# Patient Record
Sex: Male | Born: 1945
Health system: Southern US, Community
[De-identification: ages and names within clinical notes are randomized; demographics above are authoritative.]

## PROBLEM LIST (undated history)

## (undated) DIAGNOSIS — R7989 Other specified abnormal findings of blood chemistry: Secondary | ICD-10-CM

## (undated) DIAGNOSIS — F411 Generalized anxiety disorder: Secondary | ICD-10-CM

## (undated) DIAGNOSIS — G4730 Sleep apnea, unspecified: Secondary | ICD-10-CM

## (undated) DIAGNOSIS — Z87898 Personal history of other specified conditions: Secondary | ICD-10-CM

## (undated) DIAGNOSIS — I499 Cardiac arrhythmia, unspecified: Secondary | ICD-10-CM

## (undated) DIAGNOSIS — M9080 Osteopathy in diseases classified elsewhere, unspecified site: Secondary | ICD-10-CM

## (undated) DIAGNOSIS — I517 Cardiomegaly: Secondary | ICD-10-CM

## (undated) DIAGNOSIS — D509 Iron deficiency anemia, unspecified: Secondary | ICD-10-CM

## (undated) DIAGNOSIS — E785 Hyperlipidemia, unspecified: Secondary | ICD-10-CM

## (undated) DIAGNOSIS — I5030 Unspecified diastolic (congestive) heart failure: Secondary | ICD-10-CM

## (undated) DIAGNOSIS — N179 Acute kidney failure, unspecified: Secondary | ICD-10-CM

## (undated) DIAGNOSIS — R0600 Dyspnea, unspecified: Secondary | ICD-10-CM

## (undated) DIAGNOSIS — M4850XA Collapsed vertebra, not elsewhere classified, site unspecified, initial encounter for fracture: Secondary | ICD-10-CM

## (undated) DIAGNOSIS — R2681 Unsteadiness on feet: Secondary | ICD-10-CM

## (undated) DIAGNOSIS — Z87442 Personal history of urinary calculi: Secondary | ICD-10-CM

## (undated) DIAGNOSIS — S8992XA Unspecified injury of left lower leg, initial encounter: Secondary | ICD-10-CM

## (undated) DIAGNOSIS — M1990 Unspecified osteoarthritis, unspecified site: Secondary | ICD-10-CM

## (undated) DIAGNOSIS — E039 Hypothyroidism, unspecified: Secondary | ICD-10-CM

## (undated) DIAGNOSIS — I4891 Unspecified atrial fibrillation: Secondary | ICD-10-CM

## (undated) DIAGNOSIS — N200 Calculus of kidney: Secondary | ICD-10-CM

## (undated) DIAGNOSIS — Z9181 History of falling: Secondary | ICD-10-CM

## (undated) DIAGNOSIS — K219 Gastro-esophageal reflux disease without esophagitis: Secondary | ICD-10-CM

## (undated) DIAGNOSIS — M545 Low back pain, unspecified: Secondary | ICD-10-CM

## (undated) DIAGNOSIS — E890 Postprocedural hypothyroidism: Secondary | ICD-10-CM

## (undated) DIAGNOSIS — N400 Enlarged prostate without lower urinary tract symptoms: Secondary | ICD-10-CM

## (undated) DIAGNOSIS — Z8639 Personal history of other endocrine, nutritional and metabolic disease: Secondary | ICD-10-CM

## (undated) DIAGNOSIS — N390 Urinary tract infection, site not specified: Secondary | ICD-10-CM

## (undated) DIAGNOSIS — D696 Thrombocytopenia, unspecified: Secondary | ICD-10-CM

## (undated) DIAGNOSIS — R296 Repeated falls: Secondary | ICD-10-CM

## (undated) DIAGNOSIS — N401 Enlarged prostate with lower urinary tract symptoms: Secondary | ICD-10-CM

## (undated) DIAGNOSIS — Z974 Presence of external hearing-aid: Secondary | ICD-10-CM

## (undated) DIAGNOSIS — I4819 Other persistent atrial fibrillation: Secondary | ICD-10-CM

## (undated) DIAGNOSIS — F329 Major depressive disorder, single episode, unspecified: Secondary | ICD-10-CM

## (undated) DIAGNOSIS — R2689 Other abnormalities of gait and mobility: Secondary | ICD-10-CM

## (undated) DIAGNOSIS — L400 Psoriasis vulgaris: Secondary | ICD-10-CM

## (undated) DIAGNOSIS — F419 Anxiety disorder, unspecified: Secondary | ICD-10-CM

## (undated) DIAGNOSIS — T7840XA Allergy, unspecified, initial encounter: Secondary | ICD-10-CM

## (undated) DIAGNOSIS — S62109A Fracture of unspecified carpal bone, unspecified wrist, initial encounter for closed fracture: Secondary | ICD-10-CM

## (undated) DIAGNOSIS — Z8619 Personal history of other infectious and parasitic diseases: Secondary | ICD-10-CM

## (undated) DIAGNOSIS — E079 Disorder of thyroid, unspecified: Secondary | ICD-10-CM

## (undated) DIAGNOSIS — F341 Dysthymic disorder: Secondary | ICD-10-CM

## (undated) DIAGNOSIS — Z8781 Personal history of (healed) traumatic fracture: Secondary | ICD-10-CM

## (undated) DIAGNOSIS — Z9289 Personal history of other medical treatment: Secondary | ICD-10-CM

## (undated) DIAGNOSIS — E889 Metabolic disorder, unspecified: Secondary | ICD-10-CM

## (undated) DIAGNOSIS — C801 Malignant (primary) neoplasm, unspecified: Secondary | ICD-10-CM

## (undated) DIAGNOSIS — I2510 Atherosclerotic heart disease of native coronary artery without angina pectoris: Secondary | ICD-10-CM

## (undated) DIAGNOSIS — T84020S Dislocation of internal right hip prosthesis, sequela: Secondary | ICD-10-CM

## (undated) DIAGNOSIS — E7800 Pure hypercholesterolemia, unspecified: Secondary | ICD-10-CM

## (undated) DIAGNOSIS — Z9109 Other allergy status, other than to drugs and biological substances: Secondary | ICD-10-CM

## (undated) DIAGNOSIS — Z5189 Encounter for other specified aftercare: Secondary | ICD-10-CM

## (undated) DIAGNOSIS — E291 Testicular hypofunction: Secondary | ICD-10-CM

## (undated) DIAGNOSIS — G4733 Obstructive sleep apnea (adult) (pediatric): Secondary | ICD-10-CM

## (undated) DIAGNOSIS — M810 Age-related osteoporosis without current pathological fracture: Secondary | ICD-10-CM

## (undated) DIAGNOSIS — N189 Chronic kidney disease, unspecified: Secondary | ICD-10-CM

## (undated) DIAGNOSIS — I10 Essential (primary) hypertension: Secondary | ICD-10-CM

## (undated) DIAGNOSIS — Z7901 Long term (current) use of anticoagulants: Secondary | ICD-10-CM

## (undated) DIAGNOSIS — C8330 Diffuse large B-cell lymphoma, unspecified site: Secondary | ICD-10-CM

## (undated) HISTORY — PX: OTHER SURGICAL HISTORY: SHX169

## (undated) HISTORY — PX: CATARACT EXTRACTION, BILATERAL: SHX1313

## (undated) HISTORY — PX: CATARACT EXTRACTION W/ INTRAOCULAR LENS  IMPLANT, BILATERAL: SHX1307

## (undated) HISTORY — PX: COLONOSCOPY: SHX174

## (undated) HISTORY — DX: Unsteadiness on feet: R26.81

## (undated) HISTORY — PX: FRACTURE SURGERY: SHX138

## (undated) HISTORY — DX: Hyperlipidemia, unspecified: E78.5

## (undated) HISTORY — PX: JOINT REPLACEMENT: SHX530

## (undated) HISTORY — PX: EYE SURGERY: SHX253

## (undated) HISTORY — DX: Encounter for other specified aftercare: Z51.89

## (undated) HISTORY — DX: Chronic kidney disease, unspecified: N18.9

## (undated) HISTORY — DX: Dislocation of internal right hip prosthesis, sequela: T84.020S

## (undated) HISTORY — PX: APPENDECTOMY: SHX54

## (undated) HISTORY — DX: Allergy, unspecified, initial encounter: T78.40XA

## (undated) HISTORY — PX: TOTAL HIP ARTHROPLASTY: SHX124

## (undated) HISTORY — DX: Benign prostatic hyperplasia without lower urinary tract symptoms: N40.0

## (undated) HISTORY — PX: CARDIAC CATHETERIZATION: SHX172

## (undated) HISTORY — PX: TOTAL KNEE ARTHROPLASTY: SHX125

## (undated) HISTORY — DX: Thrombocytopenia, unspecified: D69.6

---

## 1986-02-08 HISTORY — PX: THYROIDECTOMY: SHX17

## 1998-04-10 ENCOUNTER — Ambulatory Visit (HOSPITAL_COMMUNITY): Admission: RE | Admit: 1998-04-10 | Discharge: 1998-04-10 | Payer: Self-pay | Admitting: Dermatology

## 2004-09-16 ENCOUNTER — Ambulatory Visit: Payer: Self-pay | Admitting: Family Medicine

## 2004-09-29 ENCOUNTER — Ambulatory Visit: Payer: Self-pay | Admitting: Family Medicine

## 2004-10-01 ENCOUNTER — Ambulatory Visit: Payer: Self-pay | Admitting: Family Medicine

## 2004-10-06 ENCOUNTER — Ambulatory Visit: Payer: Self-pay | Admitting: Family Medicine

## 2004-10-08 ENCOUNTER — Ambulatory Visit: Payer: Self-pay | Admitting: Family Medicine

## 2004-10-13 ENCOUNTER — Ambulatory Visit: Payer: Self-pay | Admitting: Family Medicine

## 2004-10-15 ENCOUNTER — Ambulatory Visit: Payer: Self-pay | Admitting: Family Medicine

## 2004-10-20 ENCOUNTER — Ambulatory Visit: Payer: Self-pay | Admitting: Family Medicine

## 2004-10-22 ENCOUNTER — Ambulatory Visit: Payer: Self-pay | Admitting: Family Medicine

## 2004-10-27 ENCOUNTER — Ambulatory Visit: Payer: Self-pay | Admitting: Family Medicine

## 2004-10-29 ENCOUNTER — Ambulatory Visit: Payer: Self-pay | Admitting: Family Medicine

## 2004-11-03 ENCOUNTER — Ambulatory Visit: Payer: Self-pay | Admitting: Family Medicine

## 2004-11-05 ENCOUNTER — Ambulatory Visit: Payer: Self-pay | Admitting: Family Medicine

## 2004-11-10 ENCOUNTER — Ambulatory Visit: Payer: Self-pay | Admitting: Family Medicine

## 2004-12-09 ENCOUNTER — Ambulatory Visit: Payer: Self-pay | Admitting: Family Medicine

## 2005-01-12 ENCOUNTER — Ambulatory Visit: Payer: Self-pay | Admitting: Family Medicine

## 2005-02-11 ENCOUNTER — Ambulatory Visit: Payer: Self-pay | Admitting: Family Medicine

## 2005-03-17 ENCOUNTER — Ambulatory Visit: Payer: Self-pay | Admitting: Family Medicine

## 2005-04-12 ENCOUNTER — Ambulatory Visit: Payer: Self-pay | Admitting: Family Medicine

## 2005-05-12 ENCOUNTER — Ambulatory Visit: Payer: Self-pay | Admitting: Family Medicine

## 2005-06-17 ENCOUNTER — Ambulatory Visit: Payer: Self-pay | Admitting: Family Medicine

## 2005-07-14 ENCOUNTER — Ambulatory Visit: Payer: Self-pay | Admitting: Family Medicine

## 2005-07-21 ENCOUNTER — Ambulatory Visit: Payer: Self-pay | Admitting: Family Medicine

## 2005-07-29 ENCOUNTER — Ambulatory Visit: Payer: Self-pay | Admitting: Family Medicine

## 2005-08-03 ENCOUNTER — Ambulatory Visit: Payer: Self-pay | Admitting: Family Medicine

## 2005-09-01 ENCOUNTER — Ambulatory Visit: Payer: Self-pay | Admitting: Family Medicine

## 2005-09-16 ENCOUNTER — Ambulatory Visit: Payer: Self-pay | Admitting: Family Medicine

## 2005-10-15 ENCOUNTER — Ambulatory Visit: Payer: Self-pay | Admitting: Family Medicine

## 2005-10-21 ENCOUNTER — Ambulatory Visit: Payer: Self-pay | Admitting: Family Medicine

## 2005-11-22 ENCOUNTER — Ambulatory Visit: Payer: Self-pay | Admitting: Family Medicine

## 2005-12-21 ENCOUNTER — Ambulatory Visit: Payer: Self-pay | Admitting: Family Medicine

## 2010-10-11 HISTORY — PX: KNEE SURGERY: SHX244

## 2010-11-26 ENCOUNTER — Other Ambulatory Visit (HOSPITAL_COMMUNITY): Payer: BC Managed Care – PPO

## 2010-11-26 ENCOUNTER — Other Ambulatory Visit (HOSPITAL_COMMUNITY): Payer: Self-pay

## 2011-02-16 ENCOUNTER — Other Ambulatory Visit (HOSPITAL_COMMUNITY): Payer: BC Managed Care – PPO

## 2011-03-04 ENCOUNTER — Ambulatory Visit (HOSPITAL_COMMUNITY)
Admission: RE | Admit: 2011-03-04 | Discharge: 2011-03-04 | Disposition: A | Discharge status: Home / Self Care | Payer: Medicare Other | Source: Ambulatory Visit | Attending: Orthopedic Surgery | Admitting: Orthopedic Surgery

## 2011-03-04 ENCOUNTER — Encounter (HOSPITAL_COMMUNITY)
Admission: RE | Admit: 2011-03-04 | Discharge: 2011-03-04 | Disposition: A | Discharge status: Home / Self Care | Payer: Medicare Other | Source: Ambulatory Visit | Attending: Orthopedic Surgery | Admitting: Orthopedic Surgery

## 2011-03-04 ENCOUNTER — Other Ambulatory Visit (HOSPITAL_COMMUNITY): Payer: Self-pay | Admitting: Orthopedic Surgery

## 2011-03-04 DIAGNOSIS — Z01811 Encounter for preprocedural respiratory examination: Secondary | ICD-10-CM | POA: Insufficient documentation

## 2011-03-04 DIAGNOSIS — Z01812 Encounter for preprocedural laboratory examination: Secondary | ICD-10-CM | POA: Insufficient documentation

## 2011-03-04 DIAGNOSIS — F17200 Nicotine dependence, unspecified, uncomplicated: Secondary | ICD-10-CM | POA: Insufficient documentation

## 2011-03-04 LAB — URINALYSIS, ROUTINE W REFLEX MICROSCOPIC
Bilirubin Urine: NEGATIVE
Glucose, UA: NEGATIVE mg/dL
Ketones, ur: NEGATIVE mg/dL
Nitrite: POSITIVE — AB
Protein, ur: NEGATIVE mg/dL

## 2011-03-04 LAB — DIFFERENTIAL
Basophils Absolute: 0 10*3/uL (ref 0.0–0.1)
Basophils Relative: 1 % (ref 0–1)
Eosinophils Absolute: 0.3 10*3/uL (ref 0.0–0.7)
Lymphs Abs: 3.3 10*3/uL (ref 0.7–4.0)
Neutrophils Relative %: 50 % (ref 43–77)

## 2011-03-04 LAB — PROTIME-INR
INR: 0.88 (ref 0.00–1.49)
Prothrombin Time: 12.1 seconds (ref 11.6–15.2)

## 2011-03-04 LAB — URINE MICROSCOPIC-ADD ON

## 2011-03-04 LAB — CBC
Platelets: 199 10*3/uL (ref 150–400)
RBC: 4.95 MIL/uL (ref 4.22–5.81)
WBC: 8.1 10*3/uL (ref 4.0–10.5)

## 2011-03-04 LAB — COMPREHENSIVE METABOLIC PANEL
AST: 19 U/L (ref 0–37)
Albumin: 3.6 g/dL (ref 3.5–5.2)
BUN: 20 mg/dL (ref 6–23)
Calcium: 9.5 mg/dL (ref 8.4–10.5)
Creatinine, Ser: 0.93 mg/dL (ref 0.4–1.5)
GFR calc Af Amer: >60 mL/min (ref >60)

## 2011-03-04 LAB — SURGICAL PCR SCREEN: Staphylococcus aureus: NEGATIVE

## 2011-03-04 LAB — ABO/RH: ABO/RH(D): A POS

## 2011-03-04 LAB — APTT: aPTT: 34 seconds (ref 24–37)

## 2011-03-06 LAB — URINE CULTURE: Culture  Setup Time: 201205241636

## 2011-03-12 ENCOUNTER — Ambulatory Visit (HOSPITAL_COMMUNITY)
Admission: RE | Admit: 2011-03-12 | Discharge: 2011-03-12 | Disposition: A | Discharge status: Home / Self Care | Payer: Medicare Other | Source: Ambulatory Visit | Attending: Orthopedic Surgery | Admitting: Orthopedic Surgery

## 2011-03-12 DIAGNOSIS — Z01812 Encounter for preprocedural laboratory examination: Secondary | ICD-10-CM | POA: Insufficient documentation

## 2011-03-12 LAB — URINALYSIS, ROUTINE W REFLEX MICROSCOPIC
Bilirubin Urine: NEGATIVE
Glucose, UA: NEGATIVE mg/dL
Hgb urine dipstick: NEGATIVE
Ketones, ur: NEGATIVE mg/dL
Nitrite: NEGATIVE
Specific Gravity, Urine: 1.015 (ref 1.005–1.030)
pH: 6.5 (ref 5.0–8.0)

## 2011-03-12 LAB — HEMOGLOBIN A1C: Mean Plasma Glucose: 128 mg/dL — ABNORMAL HIGH (ref <117)

## 2011-03-13 LAB — URINE CULTURE
Colony Count: NO GROWTH
Culture  Setup Time: 201206011040
Culture: NO GROWTH

## 2011-03-15 ENCOUNTER — Inpatient Hospital Stay (HOSPITAL_COMMUNITY)
Admission: RE | Admit: 2011-03-15 | Discharge: 2011-03-17 | DRG: 470 | Disposition: A | Discharge status: Home Health | Payer: Medicare Other | Source: Ambulatory Visit | Attending: Orthopedic Surgery | Admitting: Orthopedic Surgery

## 2011-03-15 DIAGNOSIS — J449 Chronic obstructive pulmonary disease, unspecified: Secondary | ICD-10-CM | POA: Diagnosis present

## 2011-03-15 DIAGNOSIS — M1710 Unilateral primary osteoarthritis, unspecified knee: Principal | ICD-10-CM | POA: Diagnosis present

## 2011-03-15 DIAGNOSIS — F17200 Nicotine dependence, unspecified, uncomplicated: Secondary | ICD-10-CM | POA: Diagnosis present

## 2011-03-15 DIAGNOSIS — A498 Other bacterial infections of unspecified site: Secondary | ICD-10-CM | POA: Diagnosis present

## 2011-03-15 DIAGNOSIS — J4489 Other specified chronic obstructive pulmonary disease: Secondary | ICD-10-CM | POA: Diagnosis present

## 2011-03-15 DIAGNOSIS — D62 Acute posthemorrhagic anemia: Secondary | ICD-10-CM | POA: Diagnosis not present

## 2011-03-15 DIAGNOSIS — I10 Essential (primary) hypertension: Secondary | ICD-10-CM | POA: Diagnosis present

## 2011-03-15 DIAGNOSIS — N390 Urinary tract infection, site not specified: Secondary | ICD-10-CM | POA: Diagnosis present

## 2011-03-15 DIAGNOSIS — E871 Hypo-osmolality and hyponatremia: Secondary | ICD-10-CM | POA: Diagnosis present

## 2011-03-15 LAB — HEMOGLOBIN A1C
Hgb A1c MFr Bld: 6.4 % — ABNORMAL HIGH (ref <5.7)
Mean Plasma Glucose: 137 mg/dL — ABNORMAL HIGH (ref <117)

## 2011-03-15 LAB — GLUCOSE, CAPILLARY: Glucose-Capillary: 143 mg/dL — ABNORMAL HIGH (ref 70–99)

## 2011-03-15 LAB — TYPE AND SCREEN
ABO/RH(D): A POS
Antibody Screen: NEGATIVE

## 2011-03-16 LAB — BASIC METABOLIC PANEL
CO2: 27 mEq/L (ref 19–32)
Chloride: 94 mEq/L — ABNORMAL LOW (ref 96–112)
Creatinine, Ser: 0.78 mg/dL (ref 0.4–1.5)
GFR calc Af Amer: >60 mL/min (ref >60)
Sodium: 128 mEq/L — ABNORMAL LOW (ref 135–145)

## 2011-03-16 LAB — GLUCOSE, CAPILLARY: Glucose-Capillary: 141 mg/dL — ABNORMAL HIGH (ref 70–99)

## 2011-03-16 LAB — CBC
Hemoglobin: 12.1 g/dL — ABNORMAL LOW (ref 13.0–17.0)
MCH: 30.3 pg (ref 26.0–34.0)
Platelets: 149 10*3/uL — ABNORMAL LOW (ref 150–400)
RBC: 3.99 MIL/uL — ABNORMAL LOW (ref 4.22–5.81)
WBC: 10.5 10*3/uL (ref 4.0–10.5)

## 2011-03-16 NOTE — Op Note (Signed)
[name], [name]              ACCOUNT NO.:  [id number]  MEDICAL RECORD NO.:  [id number]  LOCATION:  5037                         FACILITY:  [facility]  PHYSICIAN:  Elana Alm. Thurston Lane, M.D. DATE OF BIRTH:  May 20, 1946  DATE OF PROCEDURE:  03/15/2011 DATE OF DISCHARGE:                              OPERATIVE REPORT   PREOPERATIVE DIAGNOSIS:  Right knee degenerative joint disease.  POSTOPERATIVE DIAGNOSIS:  Right knee degenerative joint disease.  PROCEDURE: 1. Right total knee replacement using DePuy cemented total knee system     with #5 cemented femur, #6 cemented tibia with 12.5-mm polyethylene     RP tibial spacer and 38 mm polyethylene cemented patella. 2. Tobramycin impregnated cement.  SURGEON:  Elana Alm. Thurston Hole, MD  ASSISTANT:  Julien Girt, PA-C  ANESTHESIA:  General.  OPERATIVE TIME:  1 hour and 20 minutes.  COMPLICATIONS:  None.  DESCRIPTION OF PROCEDURE:  [name] was brought to the operating room on March 15, 2011, after a femoral nerve block was placed in the holding room by Anesthesia.  He was placed on the operative table in supine position.  He received both Ancef 2 g and gentamicin 700 mg per Dr. Imelda Pillow recommendations for a previous UTI for preoperative prophylaxis.  After being placed under general anesthesia, he had a Foley catheter placed under sterile conditions.  His right knee was examined.  Range of motion from 0-125 degrees, mild valgus deformity, knee stable, ligamentous exam with normal patellar tracking.  The right leg was prepped using sterile DuraPrep and draped using sterile technique.  Leg was exsanguinated and a thigh tourniquet elevated at 375 mm.  Time-out procedure was called and the correct right knee identified.  Initially, through a 15-cm longitudinal incision based over the patella initial exposure was made.  The underlying subcutaneous tissues were incised along with skin incision.  A median arthrotomy was performed  revealing an excessive amount of normal-appearing joint fluid. The articular surfaces were inspected.  He had grade 3 and 4 changes medially, grade 4 changes laterally and grade 3 and 4 changes in the patellofemoral joint.  Osteophytes removed from the femoral condyles and tibial plateau.  The medial and lateral meniscal remnants were removed as well as the anterior cruciate ligament.  Intramedullary drill was drilled up the femoral canal for placement of distal femoral cutting jig which was placed in the appropriate manner of rotation and a distal 10- mm cut was made.  The distal femur was incised.  #5 was found to be the appropriate size.  #5 cutting jig was placed in appropriate manner of external rotation and then these cuts were made.  Proximal tibia was then exposed.  The tibial spines were removed with an oscillating saw. Intramedullary drill was drilled down the tibial canal for placement of proximal tibial cutting jig which was placed in the appropriate manner of rotation and a proximal 6-mm cut was made based off the medial or higher side resecting 1-2 mm off the lateral or lower side.  Spacer blocks were then placed in flexion and extension.  12.5-mm blocks gave excellent balancing, excellent stability, and excellent correction of his flexion and valgus deformities.  #6 tibial baseplate trial was  placed on the cut tibial surface with an excellent fit and a keel cut was made.  The PCL box cutter was then placed on the distal femur and then these cuts were made.  At this point, the #5 femoral trial was placed and with a #6 tibial baseplate trial the 12.5-mm polyethylene RP tibial spacer, knee was reduced, taken through range of motion from 0- 125 degrees with excellent stability and excellent correction of his flexion and valgus deformities and normal patellar tracking.  A resurfacing 10-mm cut was made along the patella and three locking holes placed for a 38-mm polyethylene  patellar trial and again patellofemoral tracking was evaluated and found to be normal.  At this point, it was felt that all the trial components were of excellent size, fit, and stability.  They were then removed.  The knee was then jet lavage irrigated with 3 liters of saline.  Proximal tibia was then exposed and the #6 tibial baseplate with tobramycin impregnated cement backing was hammered into position with an excellent fit with excess cement being removed from around the edges.  #5 femoral component with cement backing was hammered into position also with an excellent fit with excess cement being removed from around the edges.  The 12.5-mm polyethylene RP tibial spacer was placed on tibial baseplate.  The knee reduced, taken through range of motion from 0-125 degrees with excellent stability and excellent correction of his flexion and valgus deformities.  The 38-mm polyethylene cement backed patella was then placed in its position and held there with a clamp.  After the cement hardened again patellofemoral tracking was evaluated and found to be normal.  At this point, it was felt that all the components were of excellent size, fit and stability. The wound was further irrigated with saline.  Tourniquet was released. Hemostasis obtained with cautery.  The arthrotomy was then closed with #1 Ethibond suture over two medium Hemovac drains.  Subcutaneous tissues were closed with 0 and 2-0 Vicryl, subcuticular layer closed with 4-0 Monocryl.  Sterile dressings and long-leg splint applied.  The patient then awakened, extubated, taken to recovery in stable condition. Needle, sponge count was correct x2 at the end of the case. Neurovascular status normal.  Pulses 2+ and symmetric.     [name], M.D.     RAW/MEDQ  D:  03/15/2011  T:  03/15/2011  Job:  [id number]  Electronically Signed by Salvatore Marvel M.D. on 03/16/2011 01:32:34 PM

## 2011-03-17 LAB — BASIC METABOLIC PANEL
BUN: 14 mg/dL (ref 6–23)
Calcium: 8.7 mg/dL (ref 8.4–10.5)
Creatinine, Ser: 0.91 mg/dL (ref 0.4–1.5)
GFR calc non Af Amer: >60 mL/min (ref >60)
Glucose, Bld: 177 mg/dL — ABNORMAL HIGH (ref 70–99)

## 2011-03-17 LAB — GLUCOSE, CAPILLARY: Glucose-Capillary: 152 mg/dL — ABNORMAL HIGH (ref 70–99)

## 2011-03-17 LAB — CBC
HCT: 33.6 % — ABNORMAL LOW (ref 39.0–52.0)
MCHC: 33.6 g/dL (ref 30.0–36.0)
MCV: 89.1 fL (ref 78.0–100.0)
Platelets: 163 10*3/uL (ref 150–400)
RDW: 14.5 % (ref 11.5–15.5)

## 2011-06-23 ENCOUNTER — Encounter (HOSPITAL_COMMUNITY)
Admission: RE | Admit: 2011-06-23 | Discharge: 2011-06-23 | Disposition: A | Discharge status: Home / Self Care | Payer: Medicare Other | Source: Ambulatory Visit | Attending: Orthopedic Surgery | Admitting: Orthopedic Surgery

## 2011-06-23 ENCOUNTER — Other Ambulatory Visit (HOSPITAL_COMMUNITY): Payer: Self-pay | Admitting: Orthopedic Surgery

## 2011-06-23 ENCOUNTER — Ambulatory Visit (HOSPITAL_COMMUNITY)
Admission: RE | Admit: 2011-06-23 | Discharge: 2011-06-23 | Disposition: A | Discharge status: Home / Self Care | Payer: Medicare Other | Source: Ambulatory Visit | Attending: Orthopedic Surgery | Admitting: Orthopedic Surgery

## 2011-06-23 DIAGNOSIS — M1712 Unilateral primary osteoarthritis, left knee: Secondary | ICD-10-CM

## 2011-06-23 DIAGNOSIS — IMO0002 Reserved for concepts with insufficient information to code with codable children: Secondary | ICD-10-CM | POA: Insufficient documentation

## 2011-06-23 DIAGNOSIS — Z79899 Other long term (current) drug therapy: Secondary | ICD-10-CM | POA: Insufficient documentation

## 2011-06-23 DIAGNOSIS — Z01818 Encounter for other preprocedural examination: Secondary | ICD-10-CM | POA: Insufficient documentation

## 2011-06-23 DIAGNOSIS — M1710 Unilateral primary osteoarthritis, unspecified knee: Secondary | ICD-10-CM | POA: Insufficient documentation

## 2011-06-23 DIAGNOSIS — Z01812 Encounter for preprocedural laboratory examination: Secondary | ICD-10-CM | POA: Insufficient documentation

## 2011-06-23 LAB — CBC
HCT: 44.1 % (ref 39.0–52.0)
MCHC: 34 g/dL (ref 30.0–36.0)
Platelets: 227 10*3/uL (ref 150–400)
RDW: 14.3 % (ref 11.5–15.5)

## 2011-06-23 LAB — DIFFERENTIAL
Basophils Absolute: 0 10*3/uL (ref 0.0–0.1)
Basophils Relative: 0 % (ref 0–1)
Eosinophils Absolute: 0.1 10*3/uL (ref 0.0–0.7)
Eosinophils Relative: 1 % (ref 0–5)
Lymphocytes Relative: 19 % (ref 12–46)
Monocytes Absolute: 0.9 10*3/uL (ref 0.1–1.0)

## 2011-06-23 LAB — URINALYSIS, ROUTINE W REFLEX MICROSCOPIC
Hgb urine dipstick: NEGATIVE
Protein, ur: NEGATIVE mg/dL
Urobilinogen, UA: 0.2 mg/dL (ref 0.0–1.0)

## 2011-06-23 LAB — SURGICAL PCR SCREEN: MRSA, PCR: NEGATIVE

## 2011-06-23 LAB — COMPREHENSIVE METABOLIC PANEL
ALT: 25 U/L (ref 0–53)
BUN: 19 mg/dL (ref 6–23)
Calcium: 9.8 mg/dL (ref 8.4–10.5)
GFR calc Af Amer: >60 mL/min (ref >60)
Glucose, Bld: 99 mg/dL (ref 70–99)
Sodium: 137 mEq/L (ref 135–145)
Total Protein: 7.4 g/dL (ref 6.0–8.3)

## 2011-06-23 LAB — APTT: aPTT: 31 seconds (ref 24–37)

## 2011-06-23 LAB — PROTIME-INR: Prothrombin Time: 12.6 seconds (ref 11.6–15.2)

## 2011-06-24 LAB — URINE CULTURE

## 2011-06-30 ENCOUNTER — Inpatient Hospital Stay (HOSPITAL_COMMUNITY)
Admission: RE | Admit: 2011-06-30 | Discharge: 2011-07-02 | DRG: 470 | Disposition: A | Discharge status: Home Health | Payer: Medicare Other | Source: Ambulatory Visit | Attending: Orthopedic Surgery | Admitting: Orthopedic Surgery

## 2011-06-30 DIAGNOSIS — K219 Gastro-esophageal reflux disease without esophagitis: Secondary | ICD-10-CM | POA: Diagnosis present

## 2011-06-30 DIAGNOSIS — Z96659 Presence of unspecified artificial knee joint: Secondary | ICD-10-CM

## 2011-06-30 DIAGNOSIS — M1710 Unilateral primary osteoarthritis, unspecified knee: Principal | ICD-10-CM | POA: Diagnosis present

## 2011-06-30 DIAGNOSIS — F411 Generalized anxiety disorder: Secondary | ICD-10-CM | POA: Diagnosis present

## 2011-06-30 DIAGNOSIS — Z79899 Other long term (current) drug therapy: Secondary | ICD-10-CM

## 2011-06-30 DIAGNOSIS — F17200 Nicotine dependence, unspecified, uncomplicated: Secondary | ICD-10-CM | POA: Diagnosis present

## 2011-06-30 DIAGNOSIS — I10 Essential (primary) hypertension: Secondary | ICD-10-CM | POA: Diagnosis present

## 2011-06-30 DIAGNOSIS — D62 Acute posthemorrhagic anemia: Secondary | ICD-10-CM | POA: Diagnosis not present

## 2011-06-30 DIAGNOSIS — E119 Type 2 diabetes mellitus without complications: Secondary | ICD-10-CM | POA: Diagnosis present

## 2011-06-30 DIAGNOSIS — Z01812 Encounter for preprocedural laboratory examination: Secondary | ICD-10-CM

## 2011-06-30 LAB — TYPE AND SCREEN
ABO/RH(D): A POS
Antibody Screen: NEGATIVE

## 2011-06-30 LAB — GLUCOSE, CAPILLARY: Glucose-Capillary: 134 mg/dL — ABNORMAL HIGH (ref 70–99)

## 2011-07-01 LAB — CBC
HCT: 36.4 % — ABNORMAL LOW (ref 39.0–52.0)
MCV: 86.7 fL (ref 78.0–100.0)
RBC: 4.2 MIL/uL — ABNORMAL LOW (ref 4.22–5.81)
WBC: 9 10*3/uL (ref 4.0–10.5)

## 2011-07-01 LAB — BASIC METABOLIC PANEL
BUN: 13 mg/dL (ref 6–23)
CO2: 26 mEq/L (ref 19–32)
Chloride: 95 mEq/L — ABNORMAL LOW (ref 96–112)
Creatinine, Ser: 0.74 mg/dL (ref 0.50–1.35)

## 2011-07-01 LAB — GLUCOSE, CAPILLARY
Glucose-Capillary: 125 mg/dL — ABNORMAL HIGH (ref 70–99)
Glucose-Capillary: 129 mg/dL — ABNORMAL HIGH (ref 70–99)

## 2011-07-02 LAB — GLUCOSE, CAPILLARY: Glucose-Capillary: 125 mg/dL — ABNORMAL HIGH (ref 70–99)

## 2011-07-02 LAB — BASIC METABOLIC PANEL
BUN: 13 mg/dL (ref 6–23)
Creatinine, Ser: 0.71 mg/dL (ref 0.50–1.35)
GFR calc Af Amer: >60 mL/min (ref >60)
GFR calc non Af Amer: >60 mL/min (ref >60)
Glucose, Bld: 125 mg/dL — ABNORMAL HIGH (ref 70–99)

## 2011-07-02 LAB — CBC
HCT: 34.2 % — ABNORMAL LOW (ref 39.0–52.0)
Hemoglobin: 11.7 g/dL — ABNORMAL LOW (ref 13.0–17.0)
MCH: 29.3 pg (ref 26.0–34.0)
MCHC: 34.2 g/dL (ref 30.0–36.0)
MCV: 85.5 fL (ref 78.0–100.0)
RDW: 14.4 % (ref 11.5–15.5)

## 2011-07-06 NOTE — Op Note (Signed)
[name], Lane              ACCOUNT NO.:  [id number]  MEDICAL RECORD NO.:  [id number]  LOCATION:  5025                         FACILITY:  [facility]  PHYSICIAN:  Molly Maduro A. Thurston Lane, M.D. DATE OF BIRTH:  [birth year]  DATE OF PROCEDURE:  06/30/2011 DATE OF DISCHARGE:                              OPERATIVE REPORT   PREOPERATIVE DIAGNOSIS:  Left knee degenerative disc disease.  POSTOPERATIVE DIAGNOSIS:  Left knee degenerative disc disease.  PROCEDURE: 1. Left total knee replacement using DePuy cemented total knee system     with #5 cemented femur, #6 cemented tibia with 17.5 mm polyethylene     RP tibial spacer and 38 mm polyethylene cemented patella. 2. Tobramycin impregnated cement.  SURGEON:  Elana Alm. Thurston Lane, M.D.  ASSISTANT:  Julien Girt, PA-C  ANESTHESIA:  General.  OPERATIVE TIME:  1 hour and 30 minutes.  COMPLICATIONS:  None.  DESCRIPTION OF PROCEDURE:  [name] was brought into operating room on June 30, 2011, after femoral nerve block was placed by Anesthesia.  He was placed on the operative table in supine position. After being placed under general anesthesia, he received vancomycin 1 gram IV preoperatively for prophylaxis.  His left knee was examined. Range of motion from minus 5 to 125 degrees, mild varus deformity, knee stable, ligamentous exam with normal patellar tracking.  He had a Foley catheter placed under sterile conditions.  Left leg was prepped using sterile DuraPrep and draped using sterile technique.  Time-out procedure was called, and the correct left knee identified.  Left leg was exsanguinated and thigh tourniquet elevated at 375 mm.  Initially through a 15-cm longitudinal incision based over the patella, an initial exposure was made.  Then, last subcutaneous tissues were incised along with skin incision.  A median arthrotomy was performed revealing an excessive amount of normal-appearing joint fluid.  The articular surfaces were  inspected.  He had grade 4 changes medially, laterally, and in the patellofemoral joint.  Osteophytes removed from the femoral condyles and tibial plateau.  The medial lateral meniscal remnants were removed as well as the anterior cruciate ligament.  Intramedullary drill was then drilled up the femoral canal for placement of distal femoral cutting jig, which was placed in appropriate manner rotation and a distal 10-mm cut was made.  The distal femur was incised.  A #5 was found be the appropriate size.  A #5 cutting jig was placed in the appropriate manner of external rotation and then these cuts were made. Proximal tibia was then exposed.  The tibial spines were removed with an oscillating saw.  Intramedullary drill was drilled down the tibial canal for placement of proximal tibial cutting jig, which was placed in the appropriate manner rotation,  proximal 6-mm cut was made based off the medial or lower side.  Spacer blocks were then placed in flexion extension.  Blocks 17.5-mm gave excellent balancing, excellent stability, and excellent correction of his flexion and varus deformities.  A #6 tibial baseplate trial was placed on the cut tibial surface with an excellent fit and the keel cut was made.  The PCL box cutter was then placed on the distal femur, and these cuts were made. At this point, a #  5 femoral trial was placed with a #6 tibial baseplate trial and a 17.5-mm polyethylene RP tibial spacer.  The knee was reduced, taken through range of motion from zero to 125 degrees with excellent stability and excellent correction of his flexion and varus deformities and normal patellar tracking.  A resurfacing 10-mm cut was made on the patella and three locking holes placed for 38-mm polyethylene patellar trial and again patellofemoral tracking was evaluated and found to be normal.  At this point, it is felt that all the trial components were of excellent size, fit, and stability.  They were  then removed.  The knee was then jet lavage irrigated with 3 liters of saline.  The proximal tibia was then exposed and a #6 tibial baseplate with tobramycin impregnated cement backing was hammered into position with an excellent fit with excess cement being removed from around the edges.  A #5 femoral component with cement backing was hammered into position also with an excellent fit with excess cement being removed from around the edges.  The 17.5-mm polyethylene RP tibial spacer was placed on tibial baseplate.  The knee reduced, taken through range of motion from 0 to 125 degrees with excellent stability and excellent correction of his flexion and varus deformities.  The 38-mm polyethylene cement backed patella was then placed in its position and held there with a clamp.  After the cement hardened, again patellofemoral tracking was evaluated and found to be normal.  At this point, it is felt that all components were of excellent size, fit, and stability.  The wound was further irrigated with saline and the tourniquet was released.  Hemostasis obtained with cautery.  The arthrotomy was then closed with #1 Ethilon suture over two medium Hemovac drains.  Subcutaneous tissues closed 0-0 and 2-0 Vicryl subcuticular layer closed with 4-0 Monocryl.  Sterile dressings were applied and a long-leg splint.  The patient then awakened, extubated and taken to recovery room in stable condition.  Needle, sponge counts correct x2 at the end of the case.  Neurovascular status normal.  Pulses 2+ and symmetric.     [name], M.D.     RAW/MEDQ  D:  06/30/2011  T:  06/30/2011  Job:  [id number]  Electronically Signed by Salvatore Marvel M.D. on 07/06/2011 07:47:50 AM

## 2011-10-12 HISTORY — PX: CARDIAC CATHETERIZATION: SHX172

## 2012-05-11 HISTORY — PX: OTHER SURGICAL HISTORY: SHX169

## 2012-05-11 HISTORY — PX: WRIST FRACTURE SURGERY: SHX121

## 2012-08-23 ENCOUNTER — Ambulatory Visit
Admission: RE | Admit: 2012-08-23 | Discharge: 2012-08-23 | Disposition: A | Discharge status: Home / Self Care | Payer: Medicare Other | Source: Ambulatory Visit | Attending: Orthopedic Surgery | Admitting: Orthopedic Surgery

## 2012-08-23 ENCOUNTER — Other Ambulatory Visit: Payer: Self-pay | Admitting: Orthopedic Surgery

## 2012-08-23 DIAGNOSIS — T148XXA Other injury of unspecified body region, initial encounter: Secondary | ICD-10-CM

## 2012-08-23 DIAGNOSIS — R52 Pain, unspecified: Secondary | ICD-10-CM

## 2012-09-28 ENCOUNTER — Emergency Department (HOSPITAL_COMMUNITY): Payer: Medicare Other

## 2012-09-28 ENCOUNTER — Inpatient Hospital Stay (HOSPITAL_COMMUNITY)
Admission: EM | Admit: 2012-09-28 | Discharge: 2012-10-10 | DRG: 469 | Disposition: A | Discharge status: Skilled Nursing Facility | Payer: Medicare Other | Attending: Internal Medicine | Admitting: Internal Medicine

## 2012-09-28 ENCOUNTER — Encounter (HOSPITAL_COMMUNITY): Payer: Self-pay | Admitting: *Deleted

## 2012-09-28 DIAGNOSIS — S5291XA Unspecified fracture of right forearm, initial encounter for closed fracture: Secondary | ICD-10-CM | POA: Diagnosis present

## 2012-09-28 DIAGNOSIS — Z7901 Long term (current) use of anticoagulants: Secondary | ICD-10-CM

## 2012-09-28 DIAGNOSIS — I4891 Unspecified atrial fibrillation: Secondary | ICD-10-CM | POA: Diagnosis present

## 2012-09-28 DIAGNOSIS — E785 Hyperlipidemia, unspecified: Secondary | ICD-10-CM | POA: Diagnosis present

## 2012-09-28 DIAGNOSIS — E039 Hypothyroidism, unspecified: Secondary | ICD-10-CM | POA: Diagnosis present

## 2012-09-28 DIAGNOSIS — R739 Hyperglycemia, unspecified: Secondary | ICD-10-CM | POA: Diagnosis present

## 2012-09-28 DIAGNOSIS — Y831 Surgical operation with implant of artificial internal device as the cause of abnormal reaction of the patient, or of later complication, without mention of misadventure at the time of the procedure: Secondary | ICD-10-CM | POA: Diagnosis present

## 2012-09-28 DIAGNOSIS — E291 Testicular hypofunction: Secondary | ICD-10-CM | POA: Diagnosis present

## 2012-09-28 DIAGNOSIS — F329 Major depressive disorder, single episode, unspecified: Secondary | ICD-10-CM

## 2012-09-28 DIAGNOSIS — M1990 Unspecified osteoarthritis, unspecified site: Secondary | ICD-10-CM

## 2012-09-28 DIAGNOSIS — K0510 Chronic gingivitis, plaque induced: Secondary | ICD-10-CM | POA: Diagnosis present

## 2012-09-28 DIAGNOSIS — Z87891 Personal history of nicotine dependence: Secondary | ICD-10-CM

## 2012-09-28 DIAGNOSIS — F411 Generalized anxiety disorder: Secondary | ICD-10-CM | POA: Diagnosis present

## 2012-09-28 DIAGNOSIS — I10 Essential (primary) hypertension: Secondary | ICD-10-CM | POA: Diagnosis present

## 2012-09-28 DIAGNOSIS — E871 Hypo-osmolality and hyponatremia: Secondary | ICD-10-CM | POA: Diagnosis not present

## 2012-09-28 DIAGNOSIS — S72009A Fracture of unspecified part of neck of unspecified femur, initial encounter for closed fracture: Secondary | ICD-10-CM | POA: Diagnosis present

## 2012-09-28 DIAGNOSIS — D62 Acute posthemorrhagic anemia: Secondary | ICD-10-CM | POA: Diagnosis not present

## 2012-09-28 DIAGNOSIS — S7291XA Unspecified fracture of right femur, initial encounter for closed fracture: Secondary | ICD-10-CM | POA: Diagnosis present

## 2012-09-28 DIAGNOSIS — I4811 Longstanding persistent atrial fibrillation: Secondary | ICD-10-CM | POA: Diagnosis present

## 2012-09-28 DIAGNOSIS — IMO0002 Reserved for concepts with insufficient information to code with codable children: Secondary | ICD-10-CM | POA: Diagnosis present

## 2012-09-28 DIAGNOSIS — N390 Urinary tract infection, site not specified: Secondary | ICD-10-CM

## 2012-09-28 DIAGNOSIS — E559 Vitamin D deficiency, unspecified: Secondary | ICD-10-CM | POA: Diagnosis present

## 2012-09-28 DIAGNOSIS — F419 Anxiety disorder, unspecified: Secondary | ICD-10-CM | POA: Diagnosis present

## 2012-09-28 DIAGNOSIS — F32A Depression, unspecified: Secondary | ICD-10-CM | POA: Diagnosis present

## 2012-09-28 DIAGNOSIS — F3289 Other specified depressive episodes: Secondary | ICD-10-CM | POA: Diagnosis present

## 2012-09-28 DIAGNOSIS — M869 Osteomyelitis, unspecified: Secondary | ICD-10-CM | POA: Diagnosis present

## 2012-09-28 DIAGNOSIS — A498 Other bacterial infections of unspecified site: Secondary | ICD-10-CM | POA: Diagnosis present

## 2012-09-28 DIAGNOSIS — L409 Psoriasis, unspecified: Secondary | ICD-10-CM | POA: Diagnosis present

## 2012-09-28 DIAGNOSIS — K219 Gastro-esophageal reflux disease without esophagitis: Secondary | ICD-10-CM | POA: Diagnosis present

## 2012-09-28 DIAGNOSIS — G4733 Obstructive sleep apnea (adult) (pediatric): Secondary | ICD-10-CM | POA: Diagnosis present

## 2012-09-28 DIAGNOSIS — R7989 Other specified abnormal findings of blood chemistry: Secondary | ICD-10-CM | POA: Diagnosis present

## 2012-09-28 DIAGNOSIS — T84498A Other mechanical complication of other internal orthopedic devices, implants and grafts, initial encounter: Principal | ICD-10-CM | POA: Diagnosis present

## 2012-09-28 DIAGNOSIS — Y92009 Unspecified place in unspecified non-institutional (private) residence as the place of occurrence of the external cause: Secondary | ICD-10-CM

## 2012-09-28 DIAGNOSIS — I2510 Atherosclerotic heart disease of native coronary artery without angina pectoris: Secondary | ICD-10-CM | POA: Diagnosis present

## 2012-09-28 DIAGNOSIS — D649 Anemia, unspecified: Secondary | ICD-10-CM

## 2012-09-28 DIAGNOSIS — Z79899 Other long term (current) drug therapy: Secondary | ICD-10-CM

## 2012-09-28 DIAGNOSIS — W010XXA Fall on same level from slipping, tripping and stumbling without subsequent striking against object, initial encounter: Secondary | ICD-10-CM | POA: Diagnosis present

## 2012-09-28 DIAGNOSIS — L408 Other psoriasis: Secondary | ICD-10-CM | POA: Diagnosis present

## 2012-09-28 DIAGNOSIS — Z87440 Personal history of urinary (tract) infections: Secondary | ICD-10-CM | POA: Diagnosis present

## 2012-09-28 DIAGNOSIS — B9689 Other specified bacterial agents as the cause of diseases classified elsewhere: Secondary | ICD-10-CM | POA: Diagnosis present

## 2012-09-28 HISTORY — DX: Essential (primary) hypertension: I10

## 2012-09-28 HISTORY — DX: Sleep apnea, unspecified: G47.30

## 2012-09-28 MED ORDER — HYDROMORPHONE HCL PF 1 MG/ML IJ SOLN
1.0000 mg | Freq: Once | INTRAMUSCULAR | Status: AC
  Administered 2012-09-28: 1 mg via INTRAVENOUS
  Filled 2012-09-28: qty 1

## 2012-09-28 NOTE — ED Provider Notes (Signed)
History     CSN: [id number]  Arrival date & time 09/28/12  2158   First MD Initiated Contact with Patient 09/28/12 2218      Chief Complaint  Patient presents with  . Leg Pain    femur fracture 2 months ago    (Consider location/radiation/quality/duration/timing/severity/associated sxs/prior treatment) Patient is a 66 y.o. male presenting with injury. The history is provided by the patient. No language interpreter was used.  Injury  The incident occurred yesterday. The incident occurred in the street. The injury mechanism was a fall. Context: slipped on wet grass. No protective equipment was used. He came to the ER via personal transport. There is an injury to the right hip and right thigh. The pain is moderate. Pertinent negatives include no chest pain, no abdominal pain, no nausea, no vomiting, no headaches and no cough.   No current facility-administered medications on file prior to encounter.   Current Outpatient Prescriptions on File Prior to Encounter  Medication Sig Dispense Refill  . amiodarone (PACERONE) 100 MG tablet Take 100 mg by mouth daily.      [name] atorvastatin (LIPITOR) 80 MG tablet Take 80 mg by mouth daily.      . clonazePAM (KLONOPIN) 2 MG tablet Take 2 mg by mouth 2 (two) times daily as needed. For anxiety      . diltiazem (DILACOR XR) 180 MG 24 hr capsule Take 180 mg by mouth daily.      . DULoxetine (CYMBALTA) 60 MG capsule Take 60 mg by mouth daily.      [name] levothyroxine (SYNTHROID, LEVOTHROID) 200 MCG tablet Take 200 mcg by mouth daily.      [name] omeprazole (PRILOSEC) 20 MG capsule Take 20 mg by mouth daily.      . ranolazine (RANEXA) 500 MG 12 hr tablet Take 500 mg by mouth 2 (two) times daily.      . Rivaroxaban (XARELTO) 15 MG TABS tablet Take 15 mg by mouth daily.       No Known Allergies  Past Medical History  Diagnosis Date  . Hypertension   . Sleep apnea     History reviewed. No pertinent past surgical history.  History reviewed. No pertinent  family history.  History  Substance Use Topics  . Smoking status: Former Smoker    Quit date: 06/29/2012  . Smokeless tobacco: Not on file  . Alcohol Use: Yes     Comment: occasional      Review of Systems  Constitutional: Negative for fever and chills.  HENT: Negative for congestion and sore throat.   Respiratory: Negative for cough and shortness of breath.   Cardiovascular: Negative for chest pain and leg swelling.  Gastrointestinal: Negative for nausea, vomiting and abdominal pain.  Genitourinary: Negative for dysuria and urgency.  Musculoskeletal: Positive for joint swelling, arthralgias and gait problem.  Skin: Negative for rash and wound.  Neurological: Negative for dizziness and headaches.    Allergies  Review of patient's allergies indicates no known allergies.  Home Medications   Current Outpatient Rx  Name  Route  Sig  Dispense  Refill  . AMIODARONE HCL 100 MG PO TABS   Oral   Take 100 mg by mouth daily.         [name] VITAMIN C PO   Oral   Take 1 tablet by mouth daily.         . ATORVASTATIN CALCIUM 80 MG PO TABS   Oral   Take 80 mg by mouth daily.         [name]  VITAMIN D 2000 UNITS PO CAPS   Oral   Take 1 capsule by mouth daily.         [name] CLONAZEPAM 2 MG PO TABS   Oral   Take 2 mg by mouth 2 (two) times daily as needed. For anxiety         . DILTIAZEM HCL ER 180 MG PO CP24   Oral   Take 180 mg by mouth daily.         [name] DOXYCYCLINE HYCLATE 100 MG PO TABS   Oral   Take 100 mg by mouth daily.         . DULOXETINE HCL 60 MG PO CPEP   Oral   Take 60 mg by mouth daily.         [name] LEVOTHYROXINE SODIUM 200 MCG PO TABS   Oral   Take 200 mcg by mouth daily.         . ADULT MULTIVITAMIN W/MINERALS CH   Oral   Take 1 tablet by mouth daily.         [name] OMEPRAZOLE 20 MG PO CPDR   Oral   Take 20 mg by mouth daily.         . OXYCODONE-ACETAMINOPHEN 7.5-325 MG PO TABS   Oral   Take 1 tablet by mouth every 8 (eight) hours as needed.  For pain         . PROBIOTIC PO   Oral   Take 1 tablet by mouth daily.         [name] RANOLAZINE ER 500 MG PO TB12   Oral   Take 500 mg by mouth 2 (two) times daily.         [name] RIVAROXABAN 15 MG PO TABS   Oral   Take 15 mg by mouth daily.         [name] TAMSULOSIN HCL 0.4 MG PO CAPS   Oral   Take 0.4 mg by mouth daily.         . OXYCODONE HCL ER 10 MG PO TB12   Oral   Take 10 mg by mouth every 12 (twelve) hours as needed. For pain           BP 130/57  Pulse 84  Temp 98.1 F (36.7 C) (Oral)  Resp 21  SpO2 99%  Physical Exam  Constitutional: He is oriented to person, place, and time. He appears well-developed and well-nourished. No distress. Backboard in place.  HENT:  Head: Normocephalic and atraumatic.  Eyes: EOM are normal. Pupils are equal, round, and reactive to light.  Neck: Full passive range of motion without pain. Neck supple.  Cardiovascular: Normal rate and regular rhythm.   Pulmonary/Chest: Effort normal and breath sounds normal.  Abdominal: Normal appearance. There is no tenderness.  Musculoskeletal:       Right knee: no tenderness found.       Lumbar back: He exhibits no tenderness and no bony tenderness.       Right upper leg: He exhibits tenderness and bony tenderness. He exhibits no swelling, no edema, no deformity and no laceration.       Legs: Neurological: He is alert and oriented to person, place, and time. GCS eye subscore is 4. GCS verbal subscore is 5. GCS motor subscore is 6.    ED Course  Procedures (including critical care time)  Labs Reviewed - No data to display DG Femur Right (Edited Result - FINAL)   Result time:09/28/12 2334  Addendum by Charline Bills, MD (09/28/12 23:46:32)   **ADDENDUM** CREATED: 09/28/2012 23:39:55  Also noted is an oblique proximal metaphyseal periprosthetic  fracture which is new from the prior CT. The fracture line extends  from the lateral proximal femoral shaft to the medial proximal/mid  femoral  shaft.  This was discussed with the ED on 09/28/2012 at 2340 hrs.  **END ADDENDUM** SIGNED BY: Charline Bills, M.D.       Edited Result - FINAL by Rad Results In Interface (09/28/12 23:34:49)    Narrative:   *RADIOLOGY REPORT*  Clinical Data: Fall, right leg pain  RIGHT FEMUR - 2 VIEW  Comparison: CT right hip dated 08/23/2012  Findings: Status post ORIF of an intertrochanteric right hip fracture with associated callus/right upper opacification.  Dynamic hip screw is malpositioned and now extends out of the femoral head.  Right total knee arthroplasty.  IMPRESSION: Status post ORIF of an intertrochanteric right hip fracture.  Malpositioned dynamic hip screw now extends out of the femoral head.  Right total knee arthroplasty.  Original Report Authenticated By: Charline Bills, M.D.             DG Pelvis 1-2 Views (Final result)   Result time:09/28/12 2332    Final result by Rad Results In Interface (09/28/12 23:32:59)    Narrative:   *RADIOLOGY REPORT*  Clinical Data: Fall, leg pain  PELVIS - 1-2 VIEW  Comparison: CT right hip dated 08/23/2012  Findings: Stable appearance of prior intertrochanteric right hip fracture.  Status post ORIF.  The dynamic hip screw is now malpositioned and extends out of the femoral head.  IMPRESSION: Status post ORIF of an intertrochanteric right hip fracture.  Malpositioned dynamic hip screw which now extends out of the femoral head.   Original Report Authenticated By: Charline Bills, M.D.           No diagnosis found.    MDM  Pt w/ PMHx of right femur fx approx 6 months ago s/p ORIF now w/ mechanical fall and right thigh pain. States yesterday he slipped in wet grass and landed on right leg. Denies chest pain, palpitations, dyspnea or severe HA prior to fall. States inability to [facility] since incident. Denies any other injury.   On exam vitals normal. ttp mid thigh, no significant ecchymosis or swelling.  Pain w/ int/ext rotation and hip flexion. No pain at knee. NVI distal w/ normal sensation 2+ DP/PT pulses. No midline lumbar spine pain. Pelvis stable  Plan concern for periprosthetic fracture - will xray pelvis and right femur and give dilaudid for pain.   Xray reveals periprosthetic oblique femur fracture. Will check cbc, cmp, coags and consult orthopedics  Ortho consulted and pt admitted to hospitalist service for further care. Admit in stable condition.         Audelia Hives, MD 09/29/12 0111  Audelia Hives, MD 09/29/12 4540  Audelia Hives, MD 09/29/12 [phone]
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
## 2012-09-29 ENCOUNTER — Encounter (HOSPITAL_COMMUNITY): Payer: Self-pay | Admitting: Internal Medicine

## 2012-09-29 DIAGNOSIS — S7291XA Unspecified fracture of right femur, initial encounter for closed fracture: Secondary | ICD-10-CM

## 2012-09-29 DIAGNOSIS — S5291XA Unspecified fracture of right forearm, initial encounter for closed fracture: Secondary | ICD-10-CM | POA: Diagnosis present

## 2012-09-29 DIAGNOSIS — S7290XA Unspecified fracture of unspecified femur, initial encounter for closed fracture: Secondary | ICD-10-CM

## 2012-09-29 DIAGNOSIS — E039 Hypothyroidism, unspecified: Secondary | ICD-10-CM | POA: Diagnosis present

## 2012-09-29 DIAGNOSIS — Z87440 Personal history of urinary (tract) infections: Secondary | ICD-10-CM | POA: Diagnosis present

## 2012-09-29 DIAGNOSIS — S5290XA Unspecified fracture of unspecified forearm, initial encounter for closed fracture: Secondary | ICD-10-CM

## 2012-09-29 DIAGNOSIS — I4811 Longstanding persistent atrial fibrillation: Secondary | ICD-10-CM | POA: Diagnosis present

## 2012-09-29 DIAGNOSIS — I4891 Unspecified atrial fibrillation: Secondary | ICD-10-CM | POA: Diagnosis present

## 2012-09-29 HISTORY — DX: Unspecified fracture of right forearm, initial encounter for closed fracture: S52.91XA

## 2012-09-29 HISTORY — DX: Unspecified fracture of right femur, initial encounter for closed fracture: S72.91XA

## 2012-09-29 LAB — COMPREHENSIVE METABOLIC PANEL
ALT: 12 U/L (ref 0–53)
Albumin: 2.9 g/dL — ABNORMAL LOW (ref 3.5–5.2)
Alkaline Phosphatase: 135 U/L — ABNORMAL HIGH (ref 39–117)
BUN: 20 mg/dL (ref 6–23)
Calcium: 8.9 mg/dL (ref 8.4–10.5)
Potassium: 3.9 mEq/L (ref 3.5–5.1)
Sodium: 132 mEq/L — ABNORMAL LOW (ref 135–145)
Total Protein: 6.4 g/dL (ref 6.0–8.3)

## 2012-09-29 LAB — CBC WITH DIFFERENTIAL/PLATELET
Basophils Relative: 0 % (ref 0–1)
Eosinophils Absolute: 0.1 10*3/uL (ref 0.0–0.7)
Eosinophils Relative: 1 % (ref 0–5)
MCH: 26.8 pg (ref 26.0–34.0)
MCHC: 32.5 g/dL (ref 30.0–36.0)
Neutrophils Relative %: 63 % (ref 43–77)
Platelets: 277 10*3/uL (ref 150–400)

## 2012-09-29 LAB — BASIC METABOLIC PANEL
BUN: 19 mg/dL (ref 6–23)
GFR calc Af Amer: 84 mL/min — ABNORMAL LOW (ref >90)
GFR calc non Af Amer: 72 mL/min — ABNORMAL LOW (ref >90)
Potassium: 3.9 mEq/L (ref 3.5–5.1)
Sodium: 135 mEq/L (ref 135–145)

## 2012-09-29 LAB — URINALYSIS, MICROSCOPIC ONLY
Hgb urine dipstick: NEGATIVE
Leukocytes, UA: NEGATIVE
Protein, ur: NEGATIVE mg/dL
Specific Gravity, Urine: 1.022 (ref 1.005–1.030)
Urobilinogen, UA: 1 mg/dL (ref 0.0–1.0)

## 2012-09-29 LAB — TSH: TSH: 7.848 u[IU]/mL — ABNORMAL HIGH (ref 0.350–4.500)

## 2012-09-29 LAB — PROTIME-INR: Prothrombin Time: 16.8 seconds — ABNORMAL HIGH (ref 11.6–15.2)

## 2012-09-29 LAB — CBC
Hemoglobin: 9.9 g/dL — ABNORMAL LOW (ref 13.0–17.0)
MCHC: 31.9 g/dL (ref 30.0–36.0)
WBC: 7.2 10*3/uL (ref 4.0–10.5)

## 2012-09-29 LAB — TESTOSTERONE: Testosterone: 26 ng/dL — ABNORMAL LOW (ref 300–890)

## 2012-09-29 MED ORDER — ADULT MULTIVITAMIN W/MINERALS CH
1.0000 | ORAL_TABLET | Freq: Every day | ORAL | Status: DC
  Administered 2012-09-29 – 2012-10-10 (×10): 1 via ORAL
  Filled 2012-09-29 (×12): qty 1

## 2012-09-29 MED ORDER — DILTIAZEM HCL ER 180 MG PO CP24
180.0000 mg | ORAL_CAPSULE | Freq: Every day | ORAL | Status: DC
  Administered 2012-09-29 – 2012-10-10 (×11): 180 mg via ORAL
  Filled 2012-09-29 (×12): qty 1

## 2012-09-29 MED ORDER — SODIUM CHLORIDE 0.9 % IV SOLN: INTRAVENOUS | Status: DC

## 2012-09-29 MED ORDER — HYDROMORPHONE HCL PF 1 MG/ML IJ SOLN: 1.0000 mg | INTRAMUSCULAR | Status: DC | PRN

## 2012-09-29 MED ORDER — LEVOTHYROXINE SODIUM 200 MCG PO TABS
200.0000 ug | ORAL_TABLET | Freq: Every day | ORAL | Status: DC
  Administered 2012-09-29 – 2012-10-08 (×9): 200 ug via ORAL
  Filled 2012-09-29 (×14): qty 1

## 2012-09-29 MED ORDER — OXYCODONE HCL 5 MG PO TABS
2.5000 mg | ORAL_TABLET | Freq: Three times a day (TID) | ORAL | Status: DC | PRN
  Administered 2012-09-29 – 2012-10-03 (×9): 2.5 mg via ORAL
  Filled 2012-09-29 (×9): qty 1

## 2012-09-29 MED ORDER — CLONAZEPAM 1 MG PO TABS
2.0000 mg | ORAL_TABLET | Freq: Two times a day (BID) | ORAL | Status: DC | PRN
  Administered 2012-09-29 – 2012-09-30 (×3): 2 mg via ORAL
  Filled 2012-09-29 (×3): qty 2

## 2012-09-29 MED ORDER — OXYCODONE HCL ER 10 MG PO T12A
10.0000 mg | EXTENDED_RELEASE_TABLET | Freq: Two times a day (BID) | ORAL | Status: DC | PRN
  Administered 2012-09-29 – 2012-09-30 (×3): 10 mg via ORAL
  Filled 2012-09-29 (×3): qty 1

## 2012-09-29 MED ORDER — HYDROMORPHONE HCL PF 1 MG/ML IJ SOLN
1.0000 mg | INTRAMUSCULAR | Status: DC | PRN
  Administered 2012-09-29 – 2012-10-02 (×7): 1 mg via INTRAVENOUS
  Filled 2012-09-29 (×7): qty 1

## 2012-09-29 MED ORDER — ONDANSETRON HCL 4 MG PO TABS: 4.0000 mg | ORAL_TABLET | Freq: Four times a day (QID) | ORAL | Status: DC | PRN

## 2012-09-29 MED ORDER — SODIUM CHLORIDE 0.9 % IJ SOLN
3.0000 mL | Freq: Two times a day (BID) | INTRAMUSCULAR | Status: DC
  Administered 2012-09-29 – 2012-10-06 (×7): 3 mL via INTRAVENOUS

## 2012-09-29 MED ORDER — ACETAMINOPHEN 650 MG RE SUPP: 650.0000 mg | Freq: Four times a day (QID) | RECTAL | Status: DC | PRN

## 2012-09-29 MED ORDER — PANTOPRAZOLE SODIUM 40 MG PO TBEC
40.0000 mg | DELAYED_RELEASE_TABLET | Freq: Every day | ORAL | Status: DC
  Administered 2012-09-29 – 2012-10-10 (×11): 40 mg via ORAL
  Filled 2012-09-29 (×12): qty 1

## 2012-09-29 MED ORDER — VITAMIN D 1000 UNITS PO TABS
2000.0000 [IU] | ORAL_TABLET | Freq: Every day | ORAL | Status: DC
  Administered 2012-09-29 – 2012-10-10 (×10): 2000 [IU] via ORAL
  Filled 2012-09-29 (×12): qty 2

## 2012-09-29 MED ORDER — DOXYCYCLINE HYCLATE 100 MG PO TABS
100.0000 mg | ORAL_TABLET | Freq: Every day | ORAL | Status: DC
  Administered 2012-09-29 – 2012-10-09 (×9): 100 mg via ORAL
  Filled 2012-09-29 (×12): qty 1

## 2012-09-29 MED ORDER — HYDROMORPHONE HCL PF 1 MG/ML IJ SOLN
1.0000 mg | Freq: Once | INTRAMUSCULAR | Status: AC
  Administered 2012-09-29: 1 mg via INTRAVENOUS
  Filled 2012-09-29: qty 1

## 2012-09-29 MED ORDER — AMIODARONE HCL 100 MG PO TABS
100.0000 mg | ORAL_TABLET | Freq: Every day | ORAL | Status: DC
  Administered 2012-09-29 – 2012-10-10 (×11): 100 mg via ORAL
  Filled 2012-09-29 (×12): qty 1

## 2012-09-29 MED ORDER — DEXTROSE 5 % IV SOLN
1.0000 g | INTRAVENOUS | Status: DC
  Administered 2012-09-29 – 2012-10-01 (×3): 1 g via INTRAVENOUS
  Filled 2012-09-29 (×3): qty 10

## 2012-09-29 MED ORDER — OXYCODONE-ACETAMINOPHEN 5-325 MG PO TABS
1.0000 | ORAL_TABLET | Freq: Three times a day (TID) | ORAL | Status: DC | PRN
  Administered 2012-09-29 – 2012-10-09 (×12): 1 via ORAL
  Filled 2012-09-29 (×12): qty 1

## 2012-09-29 MED ORDER — RANOLAZINE ER 500 MG PO TB12
500.0000 mg | ORAL_TABLET | Freq: Two times a day (BID) | ORAL | Status: DC
  Administered 2012-09-29 – 2012-10-10 (×20): 500 mg via ORAL
  Filled 2012-09-29 (×24): qty 1

## 2012-09-29 MED ORDER — TAMSULOSIN HCL 0.4 MG PO CAPS
0.4000 mg | ORAL_CAPSULE | Freq: Every day | ORAL | Status: DC
  Administered 2012-09-29 – 2012-10-10 (×11): 0.4 mg via ORAL
  Filled 2012-09-29 (×12): qty 1

## 2012-09-29 MED ORDER — DULOXETINE HCL 60 MG PO CPEP
60.0000 mg | ORAL_CAPSULE | Freq: Every day | ORAL | Status: DC
  Administered 2012-09-29 – 2012-10-10 (×12): 60 mg via ORAL
  Filled 2012-09-29 (×13): qty 1

## 2012-09-29 MED ORDER — OXYCODONE-ACETAMINOPHEN 7.5-325 MG PO TABS: 1.0000 | ORAL_TABLET | Freq: Three times a day (TID) | ORAL | Status: DC | PRN

## 2012-09-29 MED ORDER — ACETAMINOPHEN 325 MG PO TABS
650.0000 mg | ORAL_TABLET | Freq: Four times a day (QID) | ORAL | Status: DC | PRN
  Administered 2012-10-09 (×2): 650 mg via ORAL
  Filled 2012-09-29 (×2): qty 2

## 2012-09-29 MED ORDER — ONDANSETRON HCL 4 MG/2ML IJ SOLN: 4.0000 mg | Freq: Four times a day (QID) | INTRAMUSCULAR | Status: DC | PRN

## 2012-09-29 MED ORDER — ATORVASTATIN CALCIUM 80 MG PO TABS
80.0000 mg | ORAL_TABLET | Freq: Every day | ORAL | Status: DC
  Administered 2012-09-29 – 2012-10-10 (×10): 80 mg via ORAL
  Filled 2012-09-29 (×12): qty 1

## 2012-09-29 NOTE — ED Notes (Signed)
Admitting MD at bedside.
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
## 2012-09-29 NOTE — Consult Note (Addendum)
[facility] Consultation  Reason for Consult: right hip fracture nonunion Referring Physician: Dr. Toniann Fail, Medicine  [name] is an 66 y.o. male.  HPI: Patient known to me from an outpatient consultation from Dr. Chryl Heck, Rosalita Levan, for catastrophic failure of right wrist fracture, now being managed by Dr. 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  He reports falling at home two nights ago with progressive pain over yesterday until he had to come into the hospital.  His male partner, [name], has not been able to adequately assist him with mobilization and daily care.  In orthoplast for right wrist.  Past Medical History  Diagnosis Date  . Hypertension   . Sleep apnea   Hypothyroidism  Past Surgical History  Procedure Date  . Knee surgery   . Appendectomy   . Right femur surgery     Family History  Problem Relation Age of Onset  . CAD Father     Social History:  reports that he quit smoking about 3 months ago. He does not have any smokeless tobacco history on file. He reports that he drinks alcohol. He reports that he does not use illicit drugs.  Allergies: No Known Allergies  Medications: I have reviewed the patient's current medications.  Results for orders placed during the hospital encounter of 09/28/12 (from the past 48 hour(s))  CBC WITH DIFFERENTIAL     Status: Abnormal   Collection Time   09/28/12 11:40 PM      Component Value Range Comment   WBC 9.3  4.0 - 10.5 K/uL    RBC 4.03 (*) 4.22 - 5.81 MIL/uL    Hemoglobin 10.8 (*) 13.0 - 17.0 g/dL    HCT 95.6 (*) 21.3 - 52.0 %    MCV 82.4  78.0 - 100.0 fL    MCH 26.8  26.0 - 34.0 pg    MCHC 32.5  30.0 - 36.0 g/dL    RDW 08.6 (*) 57.8 - 15.5 %    Platelets 277  150 - 400 K/uL    Neutrophils Relative 63  43 - 77 %    Neutro Abs 5.9  1.7 - 7.7 K/uL    Lymphocytes Relative 26  12 - 46 %    Lymphs Abs 2.4  0.7 - 4.0 K/uL    Monocytes Relative 10  3 - 12 %    Monocytes  Absolute 0.9  0.1 - 1.0 K/uL    Eosinophils Relative 1  0 - 5 %    Eosinophils Absolute 0.1  0.0 - 0.7 K/uL    Basophils Relative 0  0 - 1 %    Basophils Absolute 0.0  0.0 - 0.1 K/uL   COMPREHENSIVE METABOLIC PANEL     Status: Abnormal   Collection Time   09/28/12 11:40 PM      Component Value Range Comment   Sodium 132 (*) 135 - 145 mEq/L    Potassium 3.9  3.5 - 5.1 mEq/L    Chloride 97  96 - 112 mEq/L    CO2 24  19 - 32 mEq/L    Glucose, Bld 135 (*) 70 - 99 mg/dL    BUN 20  6 - 23 mg/dL    Creatinine, Ser 4.69  0.50 - 1.35 mg/dL    Calcium 8.9  8.4 - 62.9 mg/dL    Total Protein 6.4  6.0 - 8.3 g/dL    Albumin 2.9 (*) 3.5 - 5.2 g/dL    AST 11  0 - 37 U/L    ALT 12  0 - 53 U/L    Alkaline Phosphatase 135 (*) 39 - 117 U/L    Total Bilirubin 0.3  0.3 - 1.2 mg/dL    GFR calc non Af Amer 65 (*) >90 mL/min    GFR calc Af Amer 76 (*) >90 mL/min   PROTIME-INR     Status: Abnormal   Collection Time   09/28/12 11:40 PM      Component Value Range Comment   Prothrombin Time 16.8 (*) 11.6 - 15.2 seconds    INR 1.40  0.00 - 1.49   URINALYSIS, MICROSCOPIC ONLY     Status: Abnormal   Collection Time   09/29/12 12:21 AM      Component Value Range Comment   Color, Urine YELLOW  YELLOW    APPearance CLOUDY (*) CLEAR    Specific Gravity, Urine 1.022  1.005 - 1.030    pH 6.0  5.0 - 8.0    Glucose, UA NEGATIVE  NEGATIVE mg/dL    Hgb urine dipstick NEGATIVE  NEGATIVE    Bilirubin Urine NEGATIVE  NEGATIVE    Ketones, ur NEGATIVE  NEGATIVE mg/dL    Protein, ur NEGATIVE  NEGATIVE mg/dL    Urobilinogen, UA 1.0  0.0 - 1.0 mg/dL    Nitrite POSITIVE (*) NEGATIVE    Leukocytes, UA NEGATIVE  NEGATIVE    WBC, UA 3-6  <3 WBC/hpf    RBC / HPF 0-2  <3 RBC/hpf    Bacteria, UA MANY (*) RARE   BASIC METABOLIC PANEL     Status: Abnormal   Collection Time   09/29/12  7:00 AM      Component Value Range Comment   Sodium 135  135 - 145 mEq/L    Potassium 3.9  3.5 - 5.1 mEq/L    Chloride 99  96 - 112  mEq/L    CO2 26  19 - 32 mEq/L    Glucose, Bld 125 (*) 70 - 99 mg/dL    BUN 19  6 - 23 mg/dL    Creatinine, Ser 0.45  0.50 - 1.35 mg/dL    Calcium 8.8  8.4 - 40.9 mg/dL    GFR calc non Af Amer 72 (*) >90 mL/min    GFR calc Af Amer 84 (*) >90 mL/min   CBC     Status: Abnormal   Collection Time   09/29/12  7:00 AM      Component Value Range Comment   WBC 7.2  4.0 - 10.5 K/uL    RBC 3.69 (*) 4.22 - 5.81 MIL/uL    Hemoglobin 9.9 (*) 13.0 - 17.0 g/dL    HCT 81.1 (*) 91.4 - 52.0 %    MCV 84.0  78.0 - 100.0 fL    MCH 26.8  26.0 - 34.0 pg    MCHC 31.9  30.0 - 36.0 g/dL    RDW 78.2 (*) 95.6 - 15.5 %    Platelets 255  150 - 400 K/uL     Dg Pelvis 1-2 Views  09/28/2012  *RADIOLOGY REPORT*  Clinical Data: Fall, leg pain  PELVIS - 1-2 VIEW  Comparison: CT right hip dated 08/23/2012  Findings: Stable appearance of prior intertrochanteric right hip fracture.  Status post ORIF.  The dynamic hip screw is now malpositioned and extends out of the femoral head.  IMPRESSION: Status post ORIF of an intertrochanteric right hip fracture.  Malpositioned dynamic hip screw which now extends out of the femoral  head.   Original Report Authenticated By: Charline Bills, M.D.    Dg Femur Right  09/28/2012  **ADDENDUM** CREATED: 09/28/2012 23:39:55  Also noted is an oblique proximal metaphyseal periprosthetic fracture which is new from the prior CT.  The fracture line extends from the lateral proximal femoral shaft to the medial proximal/mid femoral shaft.  This was discussed with the ED on 09/28/2012 at 2340 hrs.  **END ADDENDUM** SIGNED BY: Charline Bills, M.D.   09/28/2012  *RADIOLOGY REPORT*  Clinical Data: Fall, right leg pain  RIGHT FEMUR - 2 VIEW  Comparison: CT right hip dated 08/23/2012  Findings: Status post ORIF of an intertrochanteric right hip fracture with associated callus/right upper opacification.  Dynamic hip screw is malpositioned and now extends out of the femoral head.  Right total knee  arthroplasty.  IMPRESSION: Status post ORIF of an intertrochanteric right hip fracture.  Malpositioned dynamic hip screw now extends out of the femoral head.  Right total knee arthroplasty.  Original Report Authenticated By: Charline Bills, M.D.     Blood pressure 109/48, pulse 61, temperature 98.1 F (36.7 C), temperature source Oral, resp. rate 16, height 6\' 4"  (1.93 m), weight 320 lb (145.151 kg), SpO2 94.00%.  Right wrist in thermoplast RLE Right hip tender  Intact sens and motor distally  No wounds or skin lesions   Assessment/Plan: Spoke with Dr. Charlann Boxer at length re findings and reviewed films with him. I or Dr. Charlann Boxer will see patient while in hospital including this weekend but he will need xfer to a facility and then will need to return for THA to address fracture nonunion, AVN, and now acetabular wear from lag screw through femoral head.  Dr. Charlann Boxer is in process of scheduling for surgery, but schedule overbooked already so timing remains uncertain at this time. Dr. Charlann Boxer will see patient on Sunday if still in hospital OK to mobilize bed to chair as tolerated.   Myrene Galas, MD [facility] [phone] (p) 09/29/2012  12:07 PM

## 2012-09-29 NOTE — H&P (Signed)
[name] is an 66 y.o. male.  Patient was seen and examined on September 29, 2012. PCP - Dr. Thomasena Edis in [place].  Chief Complaint: Right hip pain. HPI: 66 year old male with history of hypertension, atrial fibrillation, hyperlipidemia, hypothyroidism was brought to the ER patient had a fall at his house and experienced right hip pain. Patient has had a fracture of the same area in August this year and had undergone surgery. X-rays reveal periprosthetic fracture. Patient states that he slipped and fell did not lose consciousness. Denies any chest pain or shortness of breath. Patient has been admitted for further management. Orthopedics on call was consulted.  Past Medical History  Diagnosis Date  . Hypertension   . Sleep apnea     Past Surgical History  Procedure Date  . Knee surgery   . Appendectomy   . Right femur surgery     Family History  Problem Relation Age of Onset  . CAD Father    Social History:  reports that he quit smoking about 3 months ago. He does not have any smokeless tobacco history on file. He reports that he drinks alcohol. He reports that he does not use illicit drugs.  Allergies: No Known Allergies   (Not in a hospital admission)  Results for orders placed during the hospital encounter of 09/28/12 (from the past 48 hour(s))  CBC WITH DIFFERENTIAL     Status: Abnormal   Collection Time   09/28/12 11:40 PM      Component Value Range Comment   WBC 9.3  4.0 - 10.5 K/uL    RBC 4.03 (*) 4.22 - 5.81 MIL/uL    Hemoglobin 10.8 (*) 13.0 - 17.0 g/dL    HCT 08.6 (*) 57.8 - 52.0 %    MCV 82.4  78.0 - 100.0 fL    MCH 26.8  26.0 - 34.0 pg    MCHC 32.5  30.0 - 36.0 g/dL    RDW 46.9 (*) 62.9 - 15.5 %    Platelets 277  150 - 400 K/uL    Neutrophils Relative 63  43 - 77 %    Neutro Abs 5.9  1.7 - 7.7 K/uL    Lymphocytes Relative 26  12 - 46 %    Lymphs Abs 2.4  0.7 - 4.0 K/uL    Monocytes Relative 10  3 - 12 %    Monocytes Absolute 0.9  0.1 - 1.0 K/uL    Eosinophils Relative 1  0 - 5 %    Eosinophils Absolute 0.1  0.0 - 0.7 K/uL    Basophils Relative 0  0 - 1 %    Basophils Absolute 0.0  0.0 - 0.1 K/uL   COMPREHENSIVE METABOLIC PANEL     Status: Abnormal   Collection Time   09/28/12 11:40 PM      Component Value Range Comment   Sodium 132 (*) 135 - 145 mEq/L    Potassium 3.9  3.5 - 5.1 mEq/L    Chloride 97  96 - 112 mEq/L    CO2 24  19 - 32 mEq/L    Glucose, Bld 135 (*) 70 - 99 mg/dL    BUN 20  6 - 23 mg/dL    Creatinine, Ser 5.28  0.50 - 1.35 mg/dL    Calcium 8.9  8.4 - 41.3 mg/dL    Total Protein 6.4  6.0 - 8.3 g/dL    Albumin 2.9 (*) 3.5 - 5.2 g/dL    AST 11  0 - 37  U/L    ALT 12  0 - 53 U/L    Alkaline Phosphatase 135 (*) 39 - 117 U/L    Total Bilirubin 0.3  0.3 - 1.2 mg/dL    GFR calc non Af Amer 65 (*) >90 mL/min    GFR calc Af Amer 76 (*) >90 mL/min   PROTIME-INR     Status: Abnormal   Collection Time   09/28/12 11:40 PM      Component Value Range Comment   Prothrombin Time 16.8 (*) 11.6 - 15.2 seconds    INR 1.40  0.00 - 1.49   URINALYSIS, MICROSCOPIC ONLY     Status: Abnormal   Collection Time   09/29/12 12:21 AM      Component Value Range Comment   Color, Urine YELLOW  YELLOW    APPearance CLOUDY (*) CLEAR    Specific Gravity, Urine 1.022  1.005 - 1.030    pH 6.0  5.0 - 8.0    Glucose, UA NEGATIVE  NEGATIVE mg/dL    Hgb urine dipstick NEGATIVE  NEGATIVE    Bilirubin Urine NEGATIVE  NEGATIVE    Ketones, ur NEGATIVE  NEGATIVE mg/dL    Protein, ur NEGATIVE  NEGATIVE mg/dL    Urobilinogen, UA 1.0  0.0 - 1.0 mg/dL    Nitrite POSITIVE (*) NEGATIVE    Leukocytes, UA NEGATIVE  NEGATIVE    WBC, UA 3-6  <3 WBC/hpf    RBC / HPF 0-2  <3 RBC/hpf    Bacteria, UA MANY (*) RARE    Dg Pelvis 1-2 Views  09/28/2012  *RADIOLOGY REPORT*  Clinical Data: Fall, leg pain  PELVIS - 1-2 VIEW  Comparison: CT right hip dated 08/23/2012  Findings: Stable appearance of prior intertrochanteric right hip fracture.  Status post ORIF.  The  dynamic hip screw is now malpositioned and extends out of the femoral head.  IMPRESSION: Status post ORIF of an intertrochanteric right hip fracture.  Malpositioned dynamic hip screw which now extends out of the femoral head.   Original Report Authenticated By: Charline Bills, M.D.    Dg Femur Right  09/28/2012  **ADDENDUM** CREATED: 09/28/2012 23:39:55  Also noted is an oblique proximal metaphyseal periprosthetic fracture which is new from the prior CT.  The fracture line extends from the lateral proximal femoral shaft to the medial proximal/mid femoral shaft.  This was discussed with the ED on 09/28/2012 at 2340 hrs.  **END ADDENDUM** SIGNED BY: Charline Bills, M.D.   09/28/2012  *RADIOLOGY REPORT*  Clinical Data: Fall, right leg pain  RIGHT FEMUR - 2 VIEW  Comparison: CT right hip dated 08/23/2012  Findings: Status post ORIF of an intertrochanteric right hip fracture with associated callus/right upper opacification.  Dynamic hip screw is malpositioned and now extends out of the femoral head.  Right total knee arthroplasty.  IMPRESSION: Status post ORIF of an intertrochanteric right hip fracture.  Malpositioned dynamic hip screw now extends out of the femoral head.  Right total knee arthroplasty.  Original Report Authenticated By: Charline Bills, M.D.     Review of Systems  Constitutional: Negative.   HENT: Negative.   Eyes: Negative.   Respiratory: Negative.   Cardiovascular: Negative.   Gastrointestinal: Negative.   Genitourinary: Negative.   Musculoskeletal:       Pain in the right hip and thigh area.  Skin: Negative.   Neurological: Negative.   Endo/Heme/Allergies: Negative.   Psychiatric/Behavioral: Negative.     Blood pressure 149/55, pulse 77, temperature 98.1 F (36.7 C),  temperature source Oral, resp. rate 21, SpO2 97.00%. Physical Exam  Constitutional: He is oriented to person, place, and time. He appears well-developed and well-nourished. No distress.  HENT:  Head:  Normocephalic and atraumatic.  Right Ear: External ear normal.  Left Ear: External ear normal.  Nose: Nose normal.  Mouth/Throat: Oropharynx is clear and moist. No oropharyngeal exudate.  Eyes: Conjunctivae normal are normal. Pupils are equal, round, and reactive to light. Right eye exhibits no discharge. Left eye exhibits no discharge. No scleral icterus.  Neck: Normal range of motion. Neck supple.  Cardiovascular: Normal rate and regular rhythm.   Respiratory: Effort normal and breath sounds normal. No respiratory distress. He has no wheezes. He has no rales.  GI: Soft. Bowel sounds are normal. He exhibits no distension. There is no tenderness. There is no rebound.  Musculoskeletal:       Right hand in brace. Right lower extremity painful on moving.  Neurological: He is alert and oriented to person, place, and time.  Skin: He is not diaphoretic.     Assessment/Plan #1. Right sided periprosthetic fracture - further management as per orthopedics surgeon. Patient is on xarelto which patient yesterday morning. We will hold off in anticipation of possible procedure. #2. Right forearm fracture and wound - patient is on antibiotics for the same. Which we will continue. Patient is being followed by Dr. Amanda Pea for the same. #3. Atrial fibrillation presently in sinus rhythm and rate controlled - continue rate controlling medications. Hold xarelto in anticipation of possible procedure. #4. Hyperlipidemia and hypothyroidism - continue present medications.  CODE STATUS - full code.  KAKRAKANDY,ARSHAD N. 09/29/2012, 2:24 AM

## 2012-09-29 NOTE — ED Notes (Signed)
Pt given water per resident. Pt aware of admission and ortho consult in the morning.

## 2012-09-29 NOTE — Progress Notes (Signed)
Patient ID: [name]  male  ZOX:[id number]    DOB: Jan 22, 1946    DOA: 09/28/2012  PCP: Ebbie Ridge, MD  Assessment/Plan: Principal Problem:  *Right femoral fracture - Discussed in detail with Dr. Carola Frost, in touch with Dr Charlann Boxer to possibly schedule surgery over the weekend, he will need total hip replacement.   - Per Dr. Carola Frost if the surgery is not possible, then he will need pain control and rehab/SNF and schedule surgery in the meantime out-patient. - Patient's partner has not been able to assist with mobilization and care. - Placed back on diet, will hold off on xarelto until final decision regarding surgery  Active Problems:  Right forearm fracture: Patient is followed by Dr. Nita Sells for the same.   Atrial fibrillation: Rate controlled, hold off on xarelto. If surgery is not decided, will restart xarelto otherwise place on heparin drip until the surgery is done.   Hypothyroidism - Obtain TSH, continue Synthroid   UTI (urinary tract infection) - Urine culture pending, placed on IV Rocephin  DVT Prophylaxis: xarelto  on hold, will restart it vs starting heparin. Currently scd's    Code Status: Full Code  Disposition:    Subjective: Pleasant and cooperative, pain controlled for now  Objective: Weight change:   Intake/Output Summary (Last 24 hours) at 09/29/12 1324 Last data filed at 09/29/12 1300  Gross per 24 hour  Intake    360 ml  Output   1100 ml  Net   -740 ml   Blood pressure 152/53, pulse 79, temperature 97.7 F (36.5 C), temperature source Oral, resp. rate 18, height 6\' 4"  (1.93 m), weight 145.151 kg (320 lb), SpO2 94.00%.  Physical Exam: General: Alert and awake, oriented x3, not in any acute distress. HEENT: anicteric sclera, pupils reactive to light and accommodation, EOMI CVS: S1-S2 clear, no murmur rubs or gallops Chest: clear to auscultation bilaterally, no wheezing, rales or rhonchi Abdomen: soft nontender, nondistended, normal bowel  sounds, no organomegaly Extremities: no cyanosis, clubbing or edema noted bilaterally. right hand in brace SCD's   Lab Results: Basic Metabolic Panel:  Lab 09/29/12 8119 09/28/12 2340  NA 135 132*  K 3.9 3.9  CL 99 97  CO2 26 24  GLUCOSE 125* 135*  BUN 19 20  CREATININE 1.04 1.13  CALCIUM 8.8 8.9  MG -- --  PHOS -- --   Liver Function Tests:  Lab 09/28/12 2340  AST 11  ALT 12  ALKPHOS 135*  BILITOT 0.3  PROT 6.4  ALBUMIN 2.9*   No results found for this basename: LIPASE:2,AMYLASE:2 in the last 168 hours No results found for this basename: AMMONIA:2 in the last 168 hours CBC:  Lab 09/29/12 0700 09/28/12 2340  WBC 7.2 9.3  NEUTROABS -- 5.9  HGB 9.9* 10.8*  HCT 31.0* 33.2*  MCV 84.0 82.4  PLT 255 277    Studies/Results: Dg Pelvis 1-2 Views  09/28/2012  *RADIOLOGY REPORT*  Clinical Data: Fall, leg pain  PELVIS - 1-2 VIEW  Comparison: CT right hip dated 08/23/2012  Findings: Stable appearance of prior intertrochanteric right hip fracture.  Status post ORIF.  The dynamic hip screw is now malpositioned and extends out of the femoral head.  IMPRESSION: Status post ORIF of an intertrochanteric right hip fracture.  Malpositioned dynamic hip screw which now extends out of the femoral head.   Original Report Authenticated By: Charline Bills, M.D.    Dg Femur Right  09/28/2012  **ADDENDUM** CREATED: 09/28/2012 23:39:55  Also noted is an  oblique proximal metaphyseal periprosthetic fracture which is new from the prior CT.  The fracture line extends from the lateral proximal femoral shaft to the medial proximal/mid femoral shaft.  This was discussed with the ED on 09/28/2012 at 2340 hrs.  **END ADDENDUM** SIGNED BY: Charline Bills, M.D.   09/28/2012  *RADIOLOGY REPORT*  Clinical Data: Fall, right leg pain  RIGHT FEMUR - 2 VIEW  Comparison: CT right hip dated 08/23/2012  Findings: Status post ORIF of an intertrochanteric right hip fracture with associated callus/right upper  opacification.  Dynamic hip screw is malpositioned and now extends out of the femoral head.  Right total knee arthroplasty.  IMPRESSION: Status post ORIF of an intertrochanteric right hip fracture.  Malpositioned dynamic hip screw now extends out of the femoral head.  Right total knee arthroplasty.  Original Report Authenticated By: Charline Bills, M.D.     Medications: Scheduled Meds:   . amiodarone  100 mg Oral Daily  . atorvastatin  80 mg Oral Daily  . cefTRIAXone (ROCEPHIN)  IV  1 g Intravenous Q24H  . cholecalciferol  2,000 Units Oral Daily  . diltiazem  180 mg Oral Daily  . doxycycline  100 mg Oral QHS  . DULoxetine  60 mg Oral Daily  . levothyroxine  200 mcg Oral QAC breakfast  . multivitamin with minerals  1 tablet Oral Daily  . pantoprazole  40 mg Oral Daily  . ranolazine  500 mg Oral BID  . sodium chloride  3 mL Intravenous Q12H  . Tamsulosin HCl  0.4 mg Oral Daily      LOS: 1 day   Valisha Heslin M.D. [facility] 09/29/2012, 1:24 PM Pager: [phone]  If 7PM-7AM, please contact night-coverage [email or web address] Password [email or web address]

## 2012-09-30 DIAGNOSIS — D649 Anemia, unspecified: Secondary | ICD-10-CM | POA: Diagnosis present

## 2012-09-30 LAB — TESTOSTERONE: Testosterone: 28.16 ng/dL — ABNORMAL LOW (ref 300–890)

## 2012-09-30 LAB — URINE CULTURE

## 2012-09-30 LAB — APTT
aPTT: 35 seconds (ref 24–37)
aPTT: 51 seconds — ABNORMAL HIGH (ref 24–37)

## 2012-09-30 LAB — HEPARIN LEVEL (UNFRACTIONATED): Heparin Unfractionated: 0.26 IU/mL — ABNORMAL LOW (ref 0.30–0.70)

## 2012-09-30 LAB — SURGICAL PCR SCREEN: MRSA, PCR: NEGATIVE

## 2012-09-30 MED ORDER — HEPARIN (PORCINE) IN NACL 100-0.45 UNIT/ML-% IJ SOLN
2000.0000 [IU]/h | INTRAMUSCULAR | Status: AC
  Administered 2012-10-01: 2050 [IU]/h via INTRAVENOUS
  Administered 2012-10-02: 2000 [IU]/h via INTRAVENOUS
  Filled 2012-09-30 (×4): qty 250

## 2012-09-30 MED ORDER — CLONAZEPAM 1 MG PO TABS
2.0000 mg | ORAL_TABLET | Freq: Three times a day (TID) | ORAL | Status: DC | PRN
  Administered 2012-09-30 – 2012-10-06 (×10): 2 mg via ORAL
  Filled 2012-09-30: qty 1
  Filled 2012-09-30 (×9): qty 2

## 2012-09-30 MED ORDER — RIVAROXABAN 15 MG PO TABS: 15.0000 mg | ORAL_TABLET | Freq: Every day | ORAL | Status: DC

## 2012-09-30 MED ORDER — HEPARIN (PORCINE) IN NACL 100-0.45 UNIT/ML-% IJ SOLN
1800.0000 [IU]/h | INTRAMUSCULAR | Status: DC
  Administered 2012-09-30: 1800 [IU]/h via INTRAVENOUS
  Filled 2012-09-30 (×2): qty 250

## 2012-09-30 MED ORDER — HEPARIN BOLUS VIA INFUSION
4000.0000 [IU] | Freq: Once | INTRAVENOUS | Status: AC
  Administered 2012-09-30: 4000 [IU] via INTRAVENOUS
  Filled 2012-09-30: qty 4000

## 2012-09-30 MED ORDER — RIVAROXABAN 20 MG PO TABS
20.0000 mg | ORAL_TABLET | Freq: Every day | ORAL | Status: DC
  Filled 2012-09-30: qty 1

## 2012-09-30 NOTE — Progress Notes (Signed)
[facility] PROGRESS NOTE  KEL SENN WJX:[id number] DOB: 15-Aug-1946 DOA: 09/28/2012 PCP: Ebbie Ridge, MD PCP - Dr. Thomasena Edis in [place]. Orthopedics--Dr. Amanda Pea  Assessment/Plan: 1. Right femoral fracture s/p mechanical fall--pain control and rehab/SNF and schedule surgery in the meantime out-patient 2. Right forearm fracture and wound--patient is on antibiotics for the same. Followed by Dr. Amanda Pea for the same. 3. Normocytic anemia--follow. 4. UTI--f/u culture. Continue empiric Rocephin. 5. Atrial fibrillation--stable, in SR on EKG on admission. Restart Xarelto, continue diltiazem, amiodarone 6. HTN--stable. 7. Hypothyroidism--f/u TSH. Continue levothyroxine.   Code Status: full code Family Communication: none present Disposition Plan: SNF 12/23  Brendia Sacks, MD  [facility] Team 6 Pager [phone] If 8PM-8AM, please contact night-coverage at [email or web address], password [facility] 09/30/2012, 10:33 AM  LOS: 2 days   Brief narrative: 66 year old male with history of hypertension, atrial fibrillation, hyperlipidemia, hypothyroidism was brought to the ER patient had a fall at his house and experienced right hip pain. Patient has had a fracture of the same area in August this year and had undergone surgery. X-rays reveal periprosthetic fracture. Patient states that he slipped and fell did not lose consciousness. Denies any chest pain or shortness of breath. Patient has been admitted for further management. Orthopedics on call was consulted.  Consultants:  Orthopedics   PT--SNF  Procedures:    Antibiotics:  Rocephin 12/20 >>  Doxycycline on prior to admission  HPI/Subjective: No complaints today except hip pain.  Objective: Filed Vitals:   09/29/12 0533 09/29/12 1303 09/29/12 2104 09/30/12 0537  BP: 109/48 152/53 127/76 135/59  Pulse: 61 79 77 76  Temp: 98.1 F (36.7 C) 97.7 F (36.5 C) 98.6 F (37 C) 97.4 F (36.3 C)  TempSrc: Oral     Resp:  16 18 18 18   Height:      Weight:      SpO2: 94% 94% 97% 95%    Intake/Output Summary (Last 24 hours) at 09/30/12 1033 Last data filed at 09/30/12 0500  Gross per 24 hour  Intake    240 ml  Output   2050 ml  Net  -1810 ml   Filed Weights   09/29/12 0300  Weight: 145.151 kg (320 lb)    Exam: General:  Appears calm and comfortable Cardiovascular: RRR, no m/r/g. No LE edema. Respiratory: CTA bilaterally, no w/r/r. Normal respiratory effort. Psychiatric: grossly normal mood and affect, speech fluent and appropriate  Data Reviewed: Basic Metabolic Panel:  Lab 09/29/12 7846 09/28/12 2340  NA 135 132*  K 3.9 3.9  CL 99 97  CO2 26 24  GLUCOSE 125* 135*  BUN 19 20  CREATININE 1.04 1.13  CALCIUM 8.8 8.9  MG -- --  PHOS -- --   Liver Function Tests:  Lab 09/28/12 2340  AST 11  ALT 12  ALKPHOS 135*  BILITOT 0.3  PROT 6.4  ALBUMIN 2.9*   CBC:  Lab 09/29/12 0700 09/28/12 2340  WBC 7.2 9.3  NEUTROABS -- 5.9  HGB 9.9* 10.8*  HCT 31.0* 33.2*  MCV 84.0 82.4  PLT 255 277    Recent Results (from the past 240 hour(s))  URINE CULTURE     Status: Normal (Preliminary result)   Collection Time   09/29/12 12:21 AM      Component Value Range Status Comment   Specimen Description URINE, CLEAN CATCH   Final    Special Requests NONE   Final    Culture  Setup Time 09/29/2012 01:25   Final    Colony Count >=  100,000 COLONIES/ML   Final    Culture ESCHERICHIA COLI   Final    Report Status PENDING   Incomplete      Studies: Dg Pelvis 1-2 Views  09/28/2012  *RADIOLOGY REPORT*  Clinical Data: Fall, leg pain  PELVIS - 1-2 VIEW  Comparison: CT right hip dated 08/23/2012  Findings: Stable appearance of prior intertrochanteric right hip fracture.  Status post ORIF.  The dynamic hip screw is now malpositioned and extends out of the femoral head.  IMPRESSION: Status post ORIF of an intertrochanteric right hip fracture.  Malpositioned dynamic hip screw which now extends out of the  femoral head.   Original Report Authenticated By: Charline Bills, M.D.    Dg Femur Right  09/28/2012  **ADDENDUM** CREATED: 09/28/2012 23:39:55  Also noted is an oblique proximal metaphyseal periprosthetic fracture which is new from the prior CT.  The fracture line extends from the lateral proximal femoral shaft to the medial proximal/mid femoral shaft.  This was discussed with the ED on 09/28/2012 at 2340 hrs.  **END ADDENDUM** SIGNED BY: Charline Bills, M.D.   09/28/2012  *RADIOLOGY REPORT*  Clinical Data: Fall, right leg pain  RIGHT FEMUR - 2 VIEW  Comparison: CT right hip dated 08/23/2012  Findings: Status post ORIF of an intertrochanteric right hip fracture with associated callus/right upper opacification.  Dynamic hip screw is malpositioned and now extends out of the femoral head.  Right total knee arthroplasty.  IMPRESSION: Status post ORIF of an intertrochanteric right hip fracture.  Malpositioned dynamic hip screw now extends out of the femoral head.  Right total knee arthroplasty.  Original Report Authenticated By: Charline Bills, M.D.     Scheduled Meds:   . amiodarone  100 mg Oral Daily  . atorvastatin  80 mg Oral Daily  . cefTRIAXone (ROCEPHIN)  IV  1 g Intravenous Q24H  . cholecalciferol  2,000 Units Oral Daily  . diltiazem  180 mg Oral Daily  . doxycycline  100 mg Oral QHS  . DULoxetine  60 mg Oral Daily  . levothyroxine  200 mcg Oral QAC breakfast  . multivitamin with minerals  1 tablet Oral Daily  . pantoprazole  40 mg Oral Daily  . ranolazine  500 mg Oral BID  . sodium chloride  3 mL Intravenous Q12H  . Tamsulosin HCl  0.4 mg Oral Daily   Continuous Infusions:   Principal Problem:  *Right femoral fracture Active Problems:  Right forearm fracture  Atrial fibrillation  Hypothyroidism  UTI (urinary tract infection)     Brendia Sacks, MD  [facility] Team 6 Pager [phone] If 8PM-8AM, please contact night-coverage at [email or web address], password  [facility] 09/30/2012, 10:33 AM  LOS: 2 days   Time spent: 20 minutes

## 2012-09-30 NOTE — Progress Notes (Addendum)
ANTICOAGULATION CONSULT NOTE - Follow Up Consult  Pharmacy Consult for heparin Indication: Afib (planned surgery tomorrow)   No Known Allergies  Patient Measurements: Height: 6\' 4"  (193 cm) Weight: 320 lb (145.151 kg) IBW/kg (Calculated) : 86.8  Heparin Dosing Weight: 119  Vital Signs: Temp: 98.1 F (36.7 C) (12/21 2144) BP: 148/63 mmHg (12/21 2144) Pulse Rate: 80  (12/21 2144)  Labs:  Basename 09/30/12 2102 09/30/12 1315 09/30/12 1253 09/29/12 0700 09/28/12 2340  HGB -- -- -- 9.9* 10.8*  HCT -- -- -- 31.0* 33.2*  PLT -- -- -- 255 277  APTT 51* 35 -- -- --  LABPROT -- -- -- -- 16.8*  INR -- -- -- -- 1.40  HEPARINUNFRC -- -- 0.26* -- --  CREATININE -- -- -- 1.04 1.13  CKTOTAL -- -- -- -- --  CKMB -- -- -- -- --  TROPONINI -- -- -- -- --    Estimated Creatinine Clearance: 107.4 ml/min (by C-G formula based on Cr of 1.04).   Medications:  Scheduled:    . amiodarone  100 mg Oral Daily  . atorvastatin  80 mg Oral Daily  . cefTRIAXone (ROCEPHIN)  IV  1 g Intravenous Q24H  . cholecalciferol  2,000 Units Oral Daily  . diltiazem  180 mg Oral Daily  . doxycycline  100 mg Oral QHS  . DULoxetine  60 mg Oral Daily  . [COMPLETED] heparin  4,000 Units Intravenous Once  . levothyroxine  200 mcg Oral QAC breakfast  . multivitamin with minerals  1 tablet Oral Daily  . pantoprazole  40 mg Oral Daily  . ranolazine  500 mg Oral BID  . sodium chloride  3 mL Intravenous Q12H  . Tamsulosin HCl  0.4 mg Oral Daily  . [DISCONTINUED] rivaroxaban  20 mg Oral Daily  . [DISCONTINUED] rivaroxaban  15 mg Oral Daily    Assessment: 66 yo male brought to ER after experiencing a fall at his house. Pt has R sided femoral fracture and R sided forearm fracture. On Xarelto PTA for afib. Last dose noted 09/28/12. Patient on heparin for afib and initial aPTT is below goal (aPTT= 51). Patient noted for surgery tomorrow.  Goal of Therapy:  aPTT 66-102 seconds Monitor platelets by anticoagulation  protocol: Yes   Plan:   -Increase heparin to 2050 units/hr -aPTT in 6 hrs and daily  Harland German, Pharm D 09/30/2012 10:02 PM

## 2012-09-30 NOTE — Evaluation (Signed)
Physical Therapy Evaluation Patient Details Name: [name] MRN: [id number] DOB: July 08, 1946 Today's Date: 09/30/2012 Time: 7829-5621 PT Time Calculation (min): 28 min  PT Assessment / Plan / Recommendation Clinical Impression  Pt adm with periprosthetic fx of rt hip and nonunion of rt wrist fx.  Pt to have rt THA at some point.  Currently pt limited by pain and will need ST-SNF.    PT Assessment  Patient needs continued PT services    Follow Up Recommendations  SNF    Does the patient have the potential to tolerate intense rehabilitation      Barriers to Discharge Decreased caregiver support      Equipment Recommendations  None recommended by PT    Recommendations for Other Services     Frequency Min 3X/week    Precautions / Restrictions Precautions Precautions: Fall Restrictions Weight Bearing Restrictions: Yes RUE Weight Bearing: Non weight bearing RLE Weight Bearing: Non weight bearing   Pertinent Vitals/Pain 8/10 in rt hip with activity: 4/10 at rest with repositioning.      Mobility  Bed Mobility Bed Mobility: Supine to Sit;Scooting to HOB Supine to Sit: 4: Min assist (to achieve long sitting starting with HOB elevated) Scooting to HOB: 1: +2 Total assist;With rail (bed in trendelenburg) Scooting to [facility]: Patient Percentage: 30% Details for Bed Mobility Assistance: Pt limited by pain and felt unable to tolerate getting to the EOB.    Shoulder Instructions     Exercises General Exercises - Lower Extremity Ankle Circles/Pumps: AAROM;Right;10 reps;Supine Quad Sets: Strengthening;Right;5 reps;Supine   PT Diagnosis: Acute pain;Generalized weakness;Difficulty walking  PT Problem List: Decreased strength;Decreased activity tolerance;Decreased mobility;Pain;Obesity PT Treatment Interventions: Functional mobility training;DME instruction;Patient/family education;Therapeutic exercise;Therapeutic activities   PT Goals Acute Rehab PT Goals PT Goal  Formulation: With patient Time For Goal Achievement: 10/07/12 Potential to Achieve Goals: Fair Pt will go Supine/Side to Sit: with +2 total assist PT Goal: Supine/Side to Sit - Progress: Goal set today Pt will go Sit to Supine/Side: with +2 total assist PT Goal: Sit to Supine/Side - Progress: Goal set today Pt will Transfer Bed to Chair/Chair to Bed: with +2 total assist PT Transfer Goal: Bed to Chair/Chair to Bed - Progress: Goal set today  Visit Information  Last PT Received On: 09/30/12 Assistance Needed: +2    Subjective Data  Subjective: Pt states there is no way he will be able to tolerate getting to the EOB today due to rt hip pain. Patient Stated Goal: To get hip better.   Prior Functioning  Home Living Lives With: Significant other Casimiro Needle -partner) Available Help at Discharge: Available 24 hours/day Type of Home: House Home Access: Ramped entrance Home Layout: One level Bathroom Shower/Tub: Engineer, manufacturing systems: Standard Home Adaptive Equipment: Bedside commode/3-in-1;Walker - rolling;Wheelchair - manual;Tub transfer bench Prior Function Level of Independence: Needs assistance Needs Assistance: Toileting;Bathing Bath: Minimal Toileting: Supervision/set-up (using BSC) Able to Take Stairs?: No Driving: No Vocation: Retired Musician: No difficulties Dominant Hand: Left    Cognition  Overall Cognitive Status: Appears within functional limits for tasks assessed/performed Arousal/Alertness: Awake/alert Orientation Level: Appears intact for tasks assessed Behavior During Session: [facility] for tasks performed    Extremity/Trunk Assessment Left Upper Extremity Assessment LUE ROM/Strength/Tone: Within functional levels Right Lower Extremity Assessment RLE ROM/Strength/Tone: Unable to fully assess;Due to pain (fair quad set, good ankle pump) Left Lower Extremity Assessment LLE ROM/Strength/Tone: Within functional levels   Balance    End of  Session PT - End of Session Activity Tolerance:  Patient limited by pain Patient left: in bed;with call bell/phone within reach Nurse Communication: Mobility status  GP     [facility] 09/30/2012, 9:50 AM  [facility] PT [phone]

## 2012-09-30 NOTE — Progress Notes (Signed)
Order received and noted that per Ortho note their recommendation is for pt to be D/C'd to a facility and then come back in for surgery once they can get it scheduled. Will defer OT eval for now, if pt stays and has surgery then will eval post surgery.

## 2012-09-30 NOTE — Progress Notes (Signed)
ANTICOAGULATION CONSULT NOTE - Initial Consult  Pharmacy Consult for Heparin Indication: Afib (planned surgery tomorrow)  No Known Allergies  Patient Measurements: Height: 6\' 4"  (193 cm) Weight: 320 lb (145.151 kg) IBW/kg (Calculated) : 86.8  Heparin Dosing Weight: 119 kg  Vital Signs: Temp: 97.4 F (36.3 C) (12/21 0537) BP: 135/59 mmHg (12/21 0537) Pulse Rate: 76  (12/21 0537)  Labs:  Basename 09/30/12 1315 09/29/12 0700 09/28/12 2340  HGB -- 9.9* 10.8*  HCT -- 31.0* 33.2*  PLT -- 255 277  APTT 35 -- --  LABPROT -- -- 16.8*  INR -- -- 1.40  HEPARINUNFRC -- -- --  CREATININE -- 1.04 1.13  CKTOTAL -- -- --  CKMB -- -- --  TROPONINI -- -- --    Estimated Creatinine Clearance: 107.4 ml/min (by C-G formula based on Cr of 1.04).   Medical History: Past Medical History  Diagnosis Date  . Hypertension   . Sleep apnea     Medications:  Scheduled:    . amiodarone  100 mg Oral Daily  . atorvastatin  80 mg Oral Daily  . cefTRIAXone (ROCEPHIN)  IV  1 g Intravenous Q24H  . cholecalciferol  2,000 Units Oral Daily  . diltiazem  180 mg Oral Daily  . doxycycline  100 mg Oral QHS  . DULoxetine  60 mg Oral Daily  . levothyroxine  200 mcg Oral QAC breakfast  . multivitamin with minerals  1 tablet Oral Daily  . pantoprazole  40 mg Oral Daily  . ranolazine  500 mg Oral BID  . sodium chloride  3 mL Intravenous Q12H  . Tamsulosin HCl  0.4 mg Oral Daily  . [DISCONTINUED] rivaroxaban  20 mg Oral Daily  . [DISCONTINUED] rivaroxaban  15 mg Oral Daily    Assessment: 66 yo male brought to ER after experiencing a fall at his house. Pt has R sided femoral fracture and R sided forearm fracture. On Xarelto PTA for afib. Last dose noted 09/28/12. Patient to begin heparin for afib due to plans for surgery tomorrow (conversion of hardware failure to R THA).   Patient's baseline PTT is 35 and baseline heparin level is 0.26 (likely affected by Xarelto). Patient's H/H is slightly low,  platelets wnl. No bleeding noted.   Goal of Therapy:  aPTT 66-102 seconds Monitor platelets by anticoagulation protocol: Yes   Plan:  Give 4000 units bolus x 1 Start heparin infusion at 1800 units/hr Check aPTT level in 6 hours and daily while on heparin Continue to monitor H&H and platelets  Lillia Pauls, PharmD Clinical Pharmacist Pager: [phone] Phone: [phone] 09/30/2012 2:21 PM

## 2012-09-30 NOTE — Progress Notes (Signed)
[facility]  Subjective:  Pt doing ok, R leg pain No acute issues  Spoke with Dr. Charlann Boxer, plan for OR 10/01/2012 for conversion of hardware failure to R THA     Objective: Vital signs in last 24 hours: Temp:  [97.4 F (36.3 C)-98.6 F (37 C)] 97.4 F (36.3 C) (12/21 0537) Pulse Rate:  [76-79] 76  (12/21 0537) Resp:  [18] 18  (12/21 0537) BP: (127-152)/(53-76) 135/59 mmHg (12/21 0537) SpO2:  [94 %-97 %] 95 % (12/21 0537)  Intake/Output from previous day: 12/20 0701 - 12/21 0700 In: 240 [P.O.:240] Out: 2050 [Urine:2050] Intake/Output this shift:     Basename 09/29/12 0700 09/28/12 2340  HGB 9.9* 10.8*    Basename 09/29/12 0700 09/28/12 2340  WBC 7.2 9.3  RBC 3.69* 4.03*  HCT 31.0* 33.2*  PLT 255 277    Basename 09/29/12 0700 09/28/12 2340  NA 135 132*  K 3.9 3.9  CL 99 97  CO2 26 24  BUN 19 20  CREATININE 1.04 1.13  GLUCOSE 125* 135*  CALCIUM 8.8 8.9    Basename 09/28/12 2340  LABPT --  INR 1.40    GONADAL   Testosterone      26.00   THYROID   TSH      7.848     Phyical Exam  Gen: Awake and alert, NAD, using computer Lungs:clear Cardiac: s1 and s2 Abd: NT Ext:      Right Lower Extremity  TTP R hip  Motor and sensory functions intact  Ext warm   + DP pulse  Swelling stable  No DCT  Assessment/Plan:  66 y/o male s/p catastrophic failure or R IMN for hip fx, nonunion of fracture  1. 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 Charlann Boxer for THA 2. Metabolic Bone Work up  Pt with very low testosterone levels.  This is a very likely reason for his nonunion   Will repeat total testosterone levels to verify results  Will need outpt endocrine eval    Another concern is is thyroid disease with synthroid supplementation and concomitant use of amiodarone, which has similar structure to T4- this can be leading to increased bone turnover in the setting of hypogonadism further exacerbating his poor bone  quality and healing potential  ( Dr. Thomasena Edis is pcp in [place])    I do believe that both should be evaluated but identifying the etiology of his low testosterone levels and getting appropriate treatment would likely result in a better healing response.     Continue with vitamin d and calcium supplementation  3. UTI  Final cx p  Pt on rocephin  4. Medical issue per IM 5. DVT/PE prophylaxis  SCD's 6. FEN  NPO after MN 7. Dispo   OR tomorrow  Will need snf post op  Mearl Latin, PA-C [facility] [phone] (P) 09/30/2012, 11:08 AM

## 2012-10-01 DIAGNOSIS — R7989 Other specified abnormal findings of blood chemistry: Secondary | ICD-10-CM | POA: Diagnosis present

## 2012-10-01 DIAGNOSIS — D649 Anemia, unspecified: Secondary | ICD-10-CM

## 2012-10-01 LAB — CBC
HCT: 33.2 % — ABNORMAL LOW (ref 39.0–52.0)
MCHC: 31.9 g/dL (ref 30.0–36.0)
Platelets: 269 10*3/uL (ref 150–400)
RDW: 17.9 % — ABNORMAL HIGH (ref 11.5–15.5)
WBC: 6.2 10*3/uL (ref 4.0–10.5)

## 2012-10-01 LAB — APTT: aPTT: 101 seconds — ABNORMAL HIGH (ref 24–37)

## 2012-10-01 LAB — TYPE AND SCREEN: ABO/RH(D): A POS

## 2012-10-01 LAB — BASIC METABOLIC PANEL
Chloride: 95 mEq/L — ABNORMAL LOW (ref 96–112)
GFR calc Af Amer: >90 mL/min (ref >90)
GFR calc non Af Amer: 85 mL/min — ABNORMAL LOW (ref >90)
Potassium: 3.8 mEq/L (ref 3.5–5.1)
Sodium: 133 mEq/L — ABNORMAL LOW (ref 135–145)

## 2012-10-01 LAB — HEPARIN LEVEL (UNFRACTIONATED): Heparin Unfractionated: 0.7 IU/mL (ref 0.30–0.70)

## 2012-10-01 NOTE — Consult Note (Signed)
  See Dict #[id number] Dominica Severin MD

## 2012-10-01 NOTE — ED Provider Notes (Signed)
I saw and evaluated the patient, reviewed the resident's note and I agree with the findings and plan.    Shelda Jakes, MD 10/01/12 [phone]

## 2012-10-01 NOTE — Progress Notes (Signed)
Patient ID: [name], male   DOB: 20-Oct-1945, 66 y.o.   MRN: [id number] Subjective:     Doing ok, ready to get his hip fixed.  Appreciative    Patient reports pain as moderate, but tolerable at this point  Objective:   VITALS:   Filed Vitals:   10/01/12 0622  BP: 142/63  Pulse: 82  Temp: 97.7 F (36.5 C)  Resp: 18    Neurovascular intact Pain with movement  LABS  Basename 10/01/12 0600 09/29/12 0700 09/28/12 2340  HGB 10.6* 9.9* 10.8*  HCT 33.2* 31.0* 33.2*  WBC 6.2 7.2 9.3  PLT 269 255 277     Basename 10/01/12 0600 09/29/12 0700 09/28/12 2340  NA 133* 135 132*  K 3.8 3.9 3.9  BUN 20 19 20   CREATININE 0.93 1.04 1.13  GLUCOSE 134* 125* 135*     Basename 10/01/12 0600 09/28/12 2340  LABPT -- --  INR 1.03 1.40     Assessment/Plan:    Failed right hip surgery   {Plan: To go to OR tomorrow most likely at [facility] for complex conversion of right hip surgery to right total hip replacement Reviewed with patient and he understands and is ready to proceed Risks and benefits reviewed NPO after midnight Consent on chart

## 2012-10-01 NOTE — Progress Notes (Signed)
[facility] PROGRESS NOTE  HARLAN ERVINE NWG:[id number] DOB: 1946-03-20 DOA: 09/28/2012 PCP: Ebbie Ridge, MD PCP - Dr. Thomasena Edis in [place]. Orthopedics--Dr. Amanda Pea  Assessment/Plan: 1. Right femoral fracture s/p mechanical fall--per orthopedics, for surgery at [facility] 12/23. 2. Right forearm fracture and wound--patient is on antibiotics for the same. Followed by Dr. Amanda Pea for the same. 3. Normocytic anemia--stable. Follow post-surgery. 4. UTI--E. coli. Completed Rocephin 12/22. 5. Atrial fibrillation--stable, in SR on EKG on admission. Xarelto on hold for sugery, orthopedics placed on heparin. Continue diltiazem, amiodarone 6. HTN--stable. 7. Hypothyroidism--TSH mildly high. Continue levothyroxine, increase dose if not done recently.   Code Status: full code Family Communication: none present Disposition Plan: SNF   Brendia Sacks, MD  [facility] Team 6 Pager [phone] If 8PM-8AM, please contact night-coverage at [email or web address], password [facility] 10/01/2012, 10:50 AM  LOS: 3 days   Brief narrative: 66 year old male with history of hypertension, atrial fibrillation, hyperlipidemia, hypothyroidism was brought to the ER patient had a fall at his house and experienced right hip pain. Patient has had a fracture of the same area in August this year and had undergone surgery. X-rays reveal periprosthetic fracture. Patient states that he slipped and fell did not lose consciousness. Denies any chest pain or shortness of breath. Patient has been admitted for further management. Orthopedics on call was consulted.  Consultants:  Orthopedics   PT--SNF  Procedures:    Antibiotics:  Rocephin 12/20 >>  Doxycycline on prior to admission  HPI/Subjective: Doing well. Excited for surgery.   Objective: Filed Vitals:   09/30/12 2043 09/30/12 2044 09/30/12 2144 10/01/12 0622  BP:   148/63 142/63  Pulse:  81 80 82  Temp:   98.1 F (36.7 C) 97.7 F (36.5 C)  TempSrc:       Resp:  18 16 18   Height:      Weight:      SpO2: 95% 95% 99% 99%    Intake/Output Summary (Last 24 hours) at 10/01/12 1050 Last data filed at 10/01/12 0500  Gross per 24 hour  Intake    483 ml  Output   2300 ml  Net  -1817 ml   Filed Weights   09/29/12 0300  Weight: 145.151 kg (320 lb)    Exam: General:  Appears calm and comfortable Cardiovascular: RRR, no m/r/g. No LE edema. Respiratory: CTA bilaterally, no w/r/r. Normal respiratory effort. Psychiatric: grossly normal mood and affect, speech fluent and appropriate  Exam current 12/22  Data Reviewed: Basic Metabolic Panel:  Lab 10/01/12 2952 09/29/12 0700 09/28/12 2340  NA 133* 135 132*  K 3.8 3.9 3.9  CL 95* 99 97  CO2 27 26 24   GLUCOSE 134* 125* 135*  BUN 20 19 20   CREATININE 0.93 1.04 1.13  CALCIUM 9.1 8.8 8.9  MG -- -- --  PHOS -- -- --   Liver Function Tests:  Lab 09/28/12 2340  AST 11  ALT 12  ALKPHOS 135*  BILITOT 0.3  PROT 6.4  ALBUMIN 2.9*   CBC:  Lab 10/01/12 0600 09/29/12 0700 09/28/12 2340  WBC 6.2 7.2 9.3  NEUTROABS -- -- 5.9  HGB 10.6* 9.9* 10.8*  HCT 33.2* 31.0* 33.2*  MCV 83.6 84.0 82.4  PLT 269 255 277    Recent Results (from the past 240 hour(s))  URINE CULTURE     Status: Normal   Collection Time   09/29/12 12:21 AM      Component Value Range Status Comment   Specimen Description URINE, CLEAN CATCH  Final    Special Requests NONE   Final    Culture  Setup Time 09/29/2012 01:25   Final    Colony Count >=100,000 COLONIES/ML   Final    Culture ESCHERICHIA COLI   Final    Report Status 09/30/2012 FINAL   Final    Organism ID, Bacteria ESCHERICHIA COLI   Final   SURGICAL PCR SCREEN     Status: Normal   Collection Time   09/30/12  7:58 PM      Component Value Range Status Comment   MRSA, PCR NEGATIVE  NEGATIVE Final    Staphylococcus aureus NEGATIVE  NEGATIVE Final      Studies: No results found.  Scheduled Meds:    . amiodarone  100 mg Oral Daily  . atorvastatin   80 mg Oral Daily  . cefTRIAXone (ROCEPHIN)  IV  1 g Intravenous Q24H  . cholecalciferol  2,000 Units Oral Daily  . diltiazem  180 mg Oral Daily  . doxycycline  100 mg Oral QHS  . DULoxetine  60 mg Oral Daily  . levothyroxine  200 mcg Oral QAC breakfast  . multivitamin with minerals  1 tablet Oral Daily  . pantoprazole  40 mg Oral Daily  . ranolazine  500 mg Oral BID  . sodium chloride  3 mL Intravenous Q12H  . Tamsulosin HCl  0.4 mg Oral Daily   Continuous Infusions:    . heparin 2,050 Units/hr (10/01/12 0309)    Principal Problem:  *Right femoral fracture Active Problems:  Right forearm fracture  Atrial fibrillation  Hypothyroidism  UTI (urinary tract infection)  Normocytic anemia     Brendia Sacks, MD  [facility] Team 6 Pager [phone] If 8PM-8AM, please contact night-coverage at [email or web address], password [facility] 10/01/2012, 10:50 AM  LOS: 3 days   Time spent: 20 minutes

## 2012-10-01 NOTE — Progress Notes (Signed)
OT NOTE Pt scheduled for surgery tomorrow at [facility]. Please reorder OT after surgery. Thank you. [facility], OTR/L  [phone] 10/01/2012

## 2012-10-01 NOTE — Progress Notes (Addendum)
ANTICOAGULATION CONSULT NOTE - Follow Up Consult  Pharmacy Consult for Heparin Indication: atrial fibrillation (planned surgery 10/02/12)  No Known Allergies  Patient Measurements: Height: 6\' 4"  (193 cm) Weight: 320 lb (145.151 kg) IBW/kg (Calculated) : 86.8  Heparin Dosing Weight: 119 kg  Vital Signs: Temp: 97.7 F (36.5 C) (12/22 0622) BP: 142/63 mmHg (12/22 0622) Pulse Rate: 82  (12/22 0622)  Labs:  Basename 10/01/12 0600 09/30/12 2102 09/30/12 1315 09/30/12 1253 09/29/12 0700 09/28/12 2340  HGB 10.6* -- -- -- 9.9* --  HCT 33.2* -- -- -- 31.0* 33.2*  PLT 269 -- -- -- 255 277  APTT 83* 51* 35 -- -- --  LABPROT 13.4 -- -- -- -- 16.8*  INR 1.03 -- -- -- -- 1.40  HEPARINUNFRC 0.57 -- -- 0.26* -- --  CREATININE 0.93 -- -- -- 1.04 1.13  CKTOTAL -- -- -- -- -- --  CKMB -- -- -- -- -- --  TROPONINI -- -- -- -- -- --    Estimated Creatinine Clearance: 120.1 ml/min (by C-G formula based on Cr of 0.93).   Medications:  Scheduled:    . amiodarone  100 mg Oral Daily  . atorvastatin  80 mg Oral Daily  . cefTRIAXone (ROCEPHIN)  IV  1 g Intravenous Q24H  . cholecalciferol  2,000 Units Oral Daily  . diltiazem  180 mg Oral Daily  . doxycycline  100 mg Oral QHS  . DULoxetine  60 mg Oral Daily  . [COMPLETED] heparin  4,000 Units Intravenous Once  . levothyroxine  200 mcg Oral QAC breakfast  . multivitamin with minerals  1 tablet Oral Daily  . pantoprazole  40 mg Oral Daily  . ranolazine  500 mg Oral BID  . sodium chloride  3 mL Intravenous Q12H  . Tamsulosin HCl  0.4 mg Oral Daily  . [DISCONTINUED] rivaroxaban  20 mg Oral Daily  . [DISCONTINUED] rivaroxaban  15 mg Oral Daily   Infusions:    . heparin 2,050 Units/hr (10/01/12 0309)  . [DISCONTINUED] heparin 1,800 Units/hr (09/30/12 1521)    Assessment: 66 yo male brought to ER after experiencing a fall at his house. Pt has R sided femoral fracture and R sided forearm fracture. On Xarelto PTA for afib. Last dose noted  09/28/12. Patient on heparin for afib with plans for surgery tomorrow 10/02/12. This morning aPTT is 83 and HL is 0.57 (therapeutic). Per Dr. Charlann Boxer, can continue heparin today and stop at 0800 tomorrow. CBC stable. No bleeding noted.   Goal of Therapy:  Heparin level 0.3-0.7 units/ml aPTT 66-102 seconds  Monitor platelets by anticoagulation protocol: Yes  Plan:  - Continue heparin at current rate of 2050 units/hr - aPTT and HL in 6 hours  - CBC daily  Lillia Pauls, PharmD Clinical Pharmacist Pager: [phone] Phone: [phone] 10/01/2012 8:52 AM  ------------- Addendum: 6 hour confirmatory heparin level/aPTT is therapeutic but on high end of therapeutic range (HL 0.70, aPTT 101). Will decrease rate by 50 units/hr and recheck HL/aPTT tomorrow morning with a.m. Labs.  Lillia Pauls, PharmD Clinical Pharmacist Pager: [phone] Phone: [phone] 10/01/2012 2:03 PM

## 2012-10-02 ENCOUNTER — Encounter (HOSPITAL_COMMUNITY): Payer: Self-pay | Admitting: Anesthesiology

## 2012-10-02 ENCOUNTER — Inpatient Hospital Stay (HOSPITAL_COMMUNITY): Payer: Medicare Other | Admitting: Anesthesiology

## 2012-10-02 ENCOUNTER — Encounter (HOSPITAL_COMMUNITY)
Admission: EM | Disposition: A | Discharge status: Skilled Nursing Facility | Payer: Self-pay | Source: Home / Self Care | Attending: Internal Medicine

## 2012-10-02 HISTORY — PX: ORIF WRIST FRACTURE: SHX2133

## 2012-10-02 LAB — BASIC METABOLIC PANEL
BUN: 20 mg/dL (ref 6–23)
Chloride: 95 mEq/L — ABNORMAL LOW (ref 96–112)
GFR calc Af Amer: >90 mL/min (ref >90)
GFR calc non Af Amer: 86 mL/min — ABNORMAL LOW (ref >90)
Glucose, Bld: 127 mg/dL — ABNORMAL HIGH (ref 70–99)
Potassium: 4.2 mEq/L (ref 3.5–5.1)
Sodium: 132 mEq/L — ABNORMAL LOW (ref 135–145)

## 2012-10-02 LAB — HIV ANTIBODY (ROUTINE TESTING W REFLEX): HIV: NONREACTIVE

## 2012-10-02 LAB — CBC
HCT: 33.2 % — ABNORMAL LOW (ref 39.0–52.0)
Hemoglobin: 10.6 g/dL — ABNORMAL LOW (ref 13.0–17.0)
MCHC: 31.9 g/dL (ref 30.0–36.0)
RDW: 17.8 % — ABNORMAL HIGH (ref 11.5–15.5)
WBC: 7.5 10*3/uL (ref 4.0–10.5)

## 2012-10-02 LAB — TESTOSTERONE, FREE: Testosterone, Free: 6 pg/mL — ABNORMAL LOW (ref 47.0–244.0)

## 2012-10-02 LAB — APTT: aPTT: 78 seconds — ABNORMAL HIGH (ref 24–37)

## 2012-10-02 LAB — MRSA PCR SCREENING: MRSA by PCR: NEGATIVE

## 2012-10-02 LAB — ABO/RH: ABO/RH(D): A POS

## 2012-10-02 LAB — TESTOSTERONE, % FREE: Testosterone-% Free: 2.3 % — ABNORMAL HIGH (ref 1.6–2.9)

## 2012-10-02 SURGERY — OPEN REDUCTION INTERNAL FIXATION (ORIF) WRIST FRACTURE
Anesthesia: General | Site: Wrist | Laterality: Right | Wound class: Clean

## 2012-10-02 MED ORDER — ACETAMINOPHEN 10 MG/ML IV SOLN: 1000.0000 mg | Freq: Once | INTRAVENOUS | Status: DC | PRN

## 2012-10-02 MED ORDER — HYDROMORPHONE HCL PF 1 MG/ML IJ SOLN
0.2500 mg | INTRAMUSCULAR | Status: DC | PRN
  Administered 2012-10-02 (×2): 0.5 mg via INTRAVENOUS

## 2012-10-02 MED ORDER — MEPERIDINE HCL 25 MG/ML IJ SOLN: 6.2500 mg | INTRAMUSCULAR | Status: DC | PRN

## 2012-10-02 MED ORDER — SODIUM CHLORIDE 0.9 % IR SOLN: Status: DC | PRN

## 2012-10-02 MED ORDER — NEOSTIGMINE METHYLSULFATE 1 MG/ML IJ SOLN
INTRAMUSCULAR | Status: DC | PRN
  Administered 2012-10-02: 4 mg via INTRAVENOUS

## 2012-10-02 MED ORDER — OXYCODONE HCL 5 MG/5ML PO SOLN: 5.0000 mg | Freq: Once | ORAL | Status: DC | PRN

## 2012-10-02 MED ORDER — VANCOMYCIN HCL 1000 MG IV SOLR
INTRAVENOUS | Status: DC | PRN
  Administered 2012-10-02: 1000 mg

## 2012-10-02 MED ORDER — HYDROMORPHONE HCL PF 1 MG/ML IJ SOLN
0.2500 mg | INTRAMUSCULAR | Status: DC | PRN
  Filled 2012-10-02: qty 1

## 2012-10-02 MED ORDER — SUCCINYLCHOLINE CHLORIDE 20 MG/ML IJ SOLN
INTRAMUSCULAR | Status: DC | PRN
  Administered 2012-10-02: 100 mg via INTRAVENOUS

## 2012-10-02 MED ORDER — CEFAZOLIN SODIUM-DEXTROSE 2-3 GM-% IV SOLR
2.0000 g | Freq: Three times a day (TID) | INTRAVENOUS | Status: DC
  Administered 2012-10-03 (×2): 2 g via INTRAVENOUS
  Filled 2012-10-02 (×4): qty 50

## 2012-10-02 MED ORDER — LACTATED RINGERS IV SOLN
INTRAVENOUS | Status: DC
  Administered 2012-10-02: 23:00:00 via INTRAVENOUS

## 2012-10-02 MED ORDER — SODIUM CHLORIDE 0.9 % IR SOLN
Status: DC | PRN
  Administered 2012-10-02: 9000 mL

## 2012-10-02 MED ORDER — ONDANSETRON HCL 4 MG/2ML IJ SOLN
INTRAMUSCULAR | Status: DC | PRN
  Administered 2012-10-02: 4 mg via INTRAVENOUS

## 2012-10-02 MED ORDER — LACTATED RINGERS IV SOLN
INTRAVENOUS | Status: DC | PRN
  Administered 2012-10-02 (×3): via INTRAVENOUS

## 2012-10-02 MED ORDER — HYDROMORPHONE HCL PF 1 MG/ML IJ SOLN
INTRAMUSCULAR | Status: DC | PRN
  Administered 2012-10-02 (×2): 1 mg via INTRAVENOUS

## 2012-10-02 MED ORDER — PROPOFOL 10 MG/ML IV BOLUS
INTRAVENOUS | Status: DC | PRN
  Administered 2012-10-02: 200 mg via INTRAVENOUS

## 2012-10-02 MED ORDER — TOBRAMYCIN SULFATE 1.2 G IJ SOLR
INTRAMUSCULAR | Status: DC | PRN
  Administered 2012-10-02: 1.2 g

## 2012-10-02 MED ORDER — SODIUM CHLORIDE 0.9 % IV SOLN
INTRAVENOUS | Status: DC
  Administered 2012-10-03 – 2012-10-06 (×6): via INTRAVENOUS
  Administered 2012-10-07: 20 mL/h via INTRAVENOUS
  Administered 2012-10-09: 18:00:00 via INTRAVENOUS

## 2012-10-02 MED ORDER — HYDROMORPHONE HCL PF 1 MG/ML IJ SOLN
0.5000 mg | INTRAMUSCULAR | Status: DC | PRN
  Administered 2012-10-03: 1 mg via INTRAVENOUS
  Filled 2012-10-02 (×2): qty 1

## 2012-10-02 MED ORDER — DEXTROSE 5 % IV SOLN
3.0000 g | INTRAVENOUS | Status: DC | PRN
  Administered 2012-10-02: 3 g via INTRAVENOUS

## 2012-10-02 MED ORDER — LIDOCAINE HCL (CARDIAC) 20 MG/ML IV SOLN
INTRAVENOUS | Status: DC | PRN
  Administered 2012-10-02: 50 mg via INTRAVENOUS

## 2012-10-02 MED ORDER — POLYETHYLENE GLYCOL 3350 17 G PO PACK
17.0000 g | PACK | Freq: Every day | ORAL | Status: DC
  Administered 2012-10-03 – 2012-10-09 (×5): 17 g via ORAL
  Filled 2012-10-02 (×2): qty 1

## 2012-10-02 MED ORDER — MIDAZOLAM HCL 5 MG/5ML IJ SOLN
INTRAMUSCULAR | Status: DC | PRN
  Administered 2012-10-02: 2 mg via INTRAVENOUS

## 2012-10-02 MED ORDER — ROCURONIUM BROMIDE 100 MG/10ML IV SOLN
INTRAVENOUS | Status: DC | PRN
  Administered 2012-10-02: 5 mg via INTRAVENOUS
  Administered 2012-10-02: 45 mg via INTRAVENOUS
  Administered 2012-10-02: 10 mg via INTRAVENOUS
  Administered 2012-10-02: 20 mg via INTRAVENOUS
  Administered 2012-10-02 (×2): 10 mg via INTRAVENOUS

## 2012-10-02 MED ORDER — OXYCODONE HCL 5 MG PO TABS
5.0000 mg | ORAL_TABLET | Freq: Once | ORAL | Status: DC | PRN
  Filled 2012-10-02: qty 1

## 2012-10-02 MED ORDER — FENTANYL CITRATE 0.05 MG/ML IJ SOLN
INTRAMUSCULAR | Status: DC | PRN
  Administered 2012-10-02 (×7): 50 ug via INTRAVENOUS

## 2012-10-02 MED ORDER — DOCUSATE SODIUM 100 MG PO CAPS
100.0000 mg | ORAL_CAPSULE | Freq: Two times a day (BID) | ORAL | Status: DC
  Administered 2012-10-03 – 2012-10-10 (×13): 100 mg via ORAL
  Filled 2012-10-02 (×3): qty 1

## 2012-10-02 MED ORDER — GLYCOPYRROLATE 0.2 MG/ML IJ SOLN
INTRAMUSCULAR | Status: DC | PRN
  Administered 2012-10-02: 0.6 mg via INTRAVENOUS

## 2012-10-02 MED ORDER — PROMETHAZINE HCL 25 MG/ML IJ SOLN: 6.2500 mg | INTRAMUSCULAR | Status: DC | PRN

## 2012-10-02 MED ORDER — LACTATED RINGERS IV SOLN: INTRAVENOUS | Status: DC

## 2012-10-02 SURGICAL SUPPLY — 123 items
BAG SPEC THK2 15X12 ZIP CLS (MISCELLANEOUS) ×6
BAG ZIPLOCK 12X15 (MISCELLANEOUS) ×8 IMPLANT
BANDAGE ELASTIC 4 VELCRO ST LF (GAUZE/BANDAGES/DRESSINGS) ×4 IMPLANT
BANDAGE ELASTIC 6 VELCRO ST LF (GAUZE/BANDAGES/DRESSINGS) ×3 IMPLANT
BANDAGE ESMARK 6X9 LF (GAUZE/BANDAGES/DRESSINGS) ×2 IMPLANT
BANDAGE GAUZE ELAST BULKY 4 IN (GAUZE/BANDAGES/DRESSINGS) ×9 IMPLANT
BIT DRILL 2.5X2.75 QC CALB (BIT) ×2 IMPLANT
BIT DRILL 2X127 MARKED (BIT) ×2 IMPLANT
BIT DRILL CALIBRATED 2.7 (BIT) ×2 IMPLANT
BLADE OSCILLATING/SAGITTAL (BLADE) ×3
BLADE SAW SGTL 18X1.27X75 (BLADE) ×1 IMPLANT
BLADE SW THK.38XMED LNG THN (BLADE) ×1 IMPLANT
BNDG CMPR 9X6 STRL LF SNTH (GAUZE/BANDAGES/DRESSINGS) ×2
BNDG ESMARK 6X9 LF (GAUZE/BANDAGES/DRESSINGS) ×3
BOWL SMART MIX CTS (DISPOSABLE) ×2 IMPLANT
BRUSH FEMORAL CANAL (MISCELLANEOUS) ×1 IMPLANT
CEMENT BONE DEPUY (Cement) ×2 IMPLANT
CLOTH BEACON ORANGE TIMEOUT ST (SAFETY) ×4 IMPLANT
CONT SPEC 4OZ CLIKSEAL STRL BL (MISCELLANEOUS) ×2 IMPLANT
CORDS BIPOLAR (ELECTRODE) ×2 IMPLANT
COVER MAYO STAND STRL (DRAPES) ×2 IMPLANT
CUFF TOURN SGL QUICK 18 (TOURNIQUET CUFF) ×3 IMPLANT
CUFF TOURN SGL QUICK 34 (TOURNIQUET CUFF)
CUFF TRNQT CYL 34X4X40X1 (TOURNIQUET CUFF) ×1 IMPLANT
DECANTER SPIKE VIAL GLASS SM (MISCELLANEOUS) ×3 IMPLANT
DRAPE C-ARM 42X72 X-RAY (DRAPES) IMPLANT
DRAPE EXTREMITY T 121X128X90 (DRAPE) IMPLANT
DRAPE INCISE IOBAN 85X60 (DRAPES) ×1 IMPLANT
DRAPE LG THREE QUARTER DISP (DRAPES) ×2 IMPLANT
DRAPE OEC MINIVIEW 54X84 (DRAPES) ×3 IMPLANT
DRAPE ORTHO SPLIT 77X108 STRL (DRAPES)
DRAPE POUCH INSTRU U-SHP 10X18 (DRAPES) ×1 IMPLANT
DRAPE STERI IOBAN 125X83 (DRAPES) ×1 IMPLANT
DRAPE SURG 17X11 SM STRL (DRAPES) ×3 IMPLANT
DRAPE SURG ORHT 6 SPLT 77X108 (DRAPES) ×2 IMPLANT
DRAPE U-SHAPE 47X51 STRL (DRAPES) ×2 IMPLANT
DRSG EMULSION OIL 3X16 NADH (GAUZE/BANDAGES/DRESSINGS) ×5 IMPLANT
DRSG MEPILEX BORDER 4X4 (GAUZE/BANDAGES/DRESSINGS) ×1 IMPLANT
DRSG MEPILEX BORDER 4X8 (GAUZE/BANDAGES/DRESSINGS) ×1 IMPLANT
DRSG PAD ABDOMINAL 8X10 ST (GAUZE/BANDAGES/DRESSINGS) ×2 IMPLANT
DURAPREP 26ML APPLICATOR (WOUND CARE) ×3 IMPLANT
ELECT BLADE TIP CTD 4 INCH (ELECTRODE) ×1 IMPLANT
ELECT REM PT RETURN 9FT ADLT (ELECTROSURGICAL) ×3
ELECTRODE REM PT RTRN 9FT ADLT (ELECTROSURGICAL) ×3 IMPLANT
EVACUATOR 1/8 PVC DRAIN (DRAIN) ×2 IMPLANT
FACESHIELD LNG OPTICON STERILE (SAFETY) ×4 IMPLANT
GAUZE SPONGE 4X4 16PLY XRAY LF (GAUZE/BANDAGES/DRESSINGS) ×6 IMPLANT
GAUZE XEROFORM 1X8 LF (GAUZE/BANDAGES/DRESSINGS) ×3 IMPLANT
GAUZE XEROFORM 5X9 LF (GAUZE/BANDAGES/DRESSINGS) ×8 IMPLANT
GLOVE BIO SURGEON STRL SZ8 (GLOVE) ×7 IMPLANT
GLOVE BIOGEL M STRL SZ7.5 (GLOVE) ×4 IMPLANT
GLOVE BIOGEL PI IND STRL 7.5 (GLOVE) ×4 IMPLANT
GLOVE BIOGEL PI IND STRL 8 (GLOVE) ×2 IMPLANT
GLOVE BIOGEL PI INDICATOR 7.5 (GLOVE) ×3
GLOVE BIOGEL PI INDICATOR 8 (GLOVE) ×1
GLOVE ECLIPSE 8.0 STRL XLNG CF (GLOVE) IMPLANT
GLOVE ORTHO TXT STRL SZ7.5 (GLOVE) ×2 IMPLANT
GLOVE SURG ORTHO 8.0 STRL STRW (GLOVE) ×1 IMPLANT
GOWN BRE IMP PREV XXLGXLNG (GOWN DISPOSABLE) ×4 IMPLANT
GOWN STRL NON-REIN LRG LVL3 (GOWN DISPOSABLE) ×3 IMPLANT
GOWN STRL REIN XL XLG (GOWN DISPOSABLE) ×3 IMPLANT
HANDPIECE INTERPULSE COAX TIP (DISPOSABLE) ×3
IV NS IRRIG 3000ML ARTHROMATIC (IV SOLUTION) ×4 IMPLANT
KIT BASIN OR (CUSTOM PROCEDURE TRAY) ×6 IMPLANT
KWIRE 4.0 X .045IN (WIRE) ×2 IMPLANT
KWIRE 4.0 X .062IN (WIRE) ×2 IMPLANT
MANIFOLD NEPTUNE II (INSTRUMENTS) ×4 IMPLANT
NS IRRIG 1000ML POUR BTL (IV SOLUTION) ×9 IMPLANT
PACK LOWER EXTREMITY WL (CUSTOM PROCEDURE TRAY) ×3 IMPLANT
PACK TOTAL JOINT (CUSTOM PROCEDURE TRAY) ×1 IMPLANT
PAD CAST 3X4 CTTN HI CHSV (CAST SUPPLIES) ×1 IMPLANT
PAD CAST 4YDX4 CTTN HI CHSV (CAST SUPPLIES) ×5 IMPLANT
PADDING CAST COTTON 3X4 STRL (CAST SUPPLIES)
PADDING CAST COTTON 4X4 STRL (CAST SUPPLIES) ×12
PADDING CAST COTTON 6X4 STRL (CAST SUPPLIES) ×1 IMPLANT
PLATE LOCK 14H 193 BILAT FIB (Plate) ×2 IMPLANT
POSITIONER SURGICAL ARM (MISCELLANEOUS) ×6 IMPLANT
PRESSURIZER FEMORAL UNIV (MISCELLANEOUS) ×1 IMPLANT
SCREW CORT T15 24X3.5XST LCK (Screw) IMPLANT
SCREW CORT T15 28X3.5XST LCK (Screw) IMPLANT
SCREW CORTICAL 2.7MM 16MM (Screw) ×2 IMPLANT
SCREW CORTICAL 3.5X24MM (Screw) IMPLANT
SCREW CORTICAL 3.5X28MM (Screw) IMPLANT
SCREW LOCK 2.7X10 FOOT (Screw) ×2 IMPLANT
SCREW LOCK 2.7X12 FOOT (Screw) ×4 IMPLANT
SCREW LOCK 2.7X20 FOOT (Screw) ×2 IMPLANT
SCREW LOCK CORT STAR 3.5X18 (Screw) ×2 IMPLANT
SCREW LOCK CORT STAR 3.5X20 (Screw) ×2 IMPLANT
SCREW LOW PROFILE 22MMX3.5MM (Screw) ×4 IMPLANT
SET CYSTO W/LG BORE CLAMP LF (SET/KITS/TRAYS/PACK) ×2 IMPLANT
SET HNDPC FAN SPRY TIP SCT (DISPOSABLE) ×2 IMPLANT
SPLINT FIBERGLASS 5X30 (CAST SUPPLIES) ×2 IMPLANT
SPONGE GAUZE 4X4 12PLY (GAUZE/BANDAGES/DRESSINGS) ×4 IMPLANT
SPONGE LAP 18X18 X RAY DECT (DISPOSABLE) ×1 IMPLANT
SPONGE LAP 4X18 X RAY DECT (DISPOSABLE) ×9 IMPLANT
STAPLER VISISTAT 35W (STAPLE) ×1 IMPLANT
STOCKINETTE 4X48 STRL (DRAPES) ×2 IMPLANT
STRIP CLOSURE SKIN 1/2X4 (GAUZE/BANDAGES/DRESSINGS) ×2 IMPLANT
SUCTION FRAZIER TIP 10 FR DISP (SUCTIONS) ×1 IMPLANT
SUT BONE WAX W31G (SUTURE) ×1 IMPLANT
SUT ETHILON 6 0 PS 3 18 (SUTURE) ×1 IMPLANT
SUT MERSILENE 4 0 P 3 (SUTURE) ×1 IMPLANT
SUT MNCRL AB 4-0 PS2 18 (SUTURE) ×1 IMPLANT
SUT PROLENE 3 0 PS 2 (SUTURE) ×9 IMPLANT
SUT PROLENE 4 0 P 3 18 (SUTURE) ×3 IMPLANT
SUT PROLENE 4 0 PS 2 18 (SUTURE) ×10 IMPLANT
SUT PROLENE 4 0 RB 1 (SUTURE)
SUT PROLENE 4-0 RB1 .5 CRCL 36 (SUTURE) ×1 IMPLANT
SUT VIC AB 0 CT1 27 (SUTURE)
SUT VIC AB 0 CT1 27XBRD ANTBC (SUTURE) ×2 IMPLANT
SUT VIC AB 1 CT1 36 (SUTURE) ×3 IMPLANT
SUT VIC AB 2-0 CT1 27 (SUTURE)
SUT VIC AB 2-0 CT1 TAPERPNT 27 (SUTURE) ×4 IMPLANT
SUT VLOC 180 0 24IN GS25 (SUTURE) ×2 IMPLANT
SWAB COLLECTION DEVICE MRSA (MISCELLANEOUS) ×6 IMPLANT
TOWEL OR 17X26 10 PK STRL BLUE (TOWEL DISPOSABLE) ×11 IMPLANT
TOWER CARTRIDGE SMART MIX (DISPOSABLE) ×1 IMPLANT
TRAY FOLEY CATH 14FRSI W/METER (CATHETERS) ×1 IMPLANT
TUBE ANAEROBIC SPECIMEN COL (MISCELLANEOUS) ×6 IMPLANT
TUBING CONNECTING 10 (TUBING) ×2 IMPLANT
UNDERPAD 30X30 INCONTINENT (UNDERPADS AND DIAPERS) ×3 IMPLANT
WATER STERILE IRR 1500ML POUR (IV SOLUTION) ×2 IMPLANT
depuy  2.7mm locking screw ×2 IMPLANT

## 2012-10-02 NOTE — Anesthesia Postprocedure Evaluation (Signed)
  Anesthesia Post-op Note  Patient: [name]  Procedure(s) Performed: Procedure(s) (LRB): CONVERSION TO TOTAL HIP (Right) HARDWARE REMOVAL (Right) OPEN REDUCTION INTERNAL FIXATION (ORIF) WRIST FRACTURE (Right)  Patient Location: PACU  Anesthesia Type: General  Level of Consciousness: awake and alert   Airway and Oxygen Therapy: Patient Spontanous Breathing  Post-op Pain: mild  Post-op Assessment: Post-op Vital signs reviewed, Patient's Cardiovascular Status Stable, Respiratory Function Stable, Patent Airway and No signs of Nausea or vomiting  Last Vitals:  Filed Vitals:   10/02/12 2230  BP: 164/56  Pulse: 73  Temp:   Resp: 13    Post-op Vital Signs: stable   Complications: No apparent anesthesia complications

## 2012-10-02 NOTE — Consult Note (Signed)
NAMEMIKLO, AKEN              ACCOUNT NO.:  [id number]  MEDICAL RECORD NO.:  [id number]  LOCATION:  5N04C                        FACILITY:  [facility]  PHYSICIAN:  Dionne Ano. Blas Riches, M.D.DATE OF BIRTH:  1946/07/23  DATE OF CONSULTATION: DATE OF DISCHARGE:                                CONSULTATION   I had the pleasure to see [name] today.  I was asked to see him for consultation in regard to his upper extremity predicament.  The patient was seen in my office last week.  I outlined a program to get him back to a hopefully more stable situation in regard to his right upper extremity.  He of course had a fracture about the radius and ulna in August.  He underwent ORIF by Dr. Magdalene Patricia.  This has gone on to failure.  The patient has a large defect in his proximal radiocarpal joint as well as his distal radius.  He has failed plate and screw fixation and disarray of the entire radius distal radioulnar joint and radiocarpal joint with loss of bone, broken hardware, and extreme loss of function.  I have ordered labs and called his primary care physician, Irena Reichmann, in regard to all issues.  It is going to my attention his vitamin D levels were very low.  He is now on a high dose of vitamin D.  He does have a history of alternative lifestyle.  His partner [name] is HIV positive, but according to his report, he states he is not sexually active, but has not been tested in 20 years, thus I obtained verbal consent to test him.  The patient and I note that he is on Enbrel secondary to psoriasis.  We also noted that this has been a very challenging fracture about the right upper extremity.  He was admitted to the hospital due to the fact that Dr. Charlann Boxer is going to perform reconstructive surgery for his hip due to the fact that he cannot walk.  He has had failed hip reconstruction from a fracture and plans are for matriculation to the operating room tomorrow  for reconstruction with total hip arthroplasty.  I conferred with Dr. Charlann Boxer and the needs of the patient.  At this time, we are planning to try to coordinate the reconstruction of his right wrist and forearm with the hip reconstruction.  I have discussed with the patient these issues at length.  On examination, he is noted to have intact refill.  There is no complications of infection about the elbow.  He has a hypertrophic granuloma about the volar forearm.  He has poor function to the finger secondary to loss of terminal flexion and extension due to the issues involved with the joint.  He has improved skin conditions after I saw him in the office and start him on a wound care program/skin program.  His skin certainly in my opinion looks stable enough for surgery at this juncture.  I have reviewed this at length.  The opposite extremity has IV access.  I have spent a great deal of time with him today discussing all issues.  IMPRESSION:  Status post attempted open reduction internal fixation radius and ulna, right  upper extremity with failed fixation and segmental bone loss into the radiocarpal joint.  Cannot rule out infection focus.  PLAN:  I have discussed the patient's findings at present time given the fact that he is going to be going to the operating room tomorrow and that his skin is in much __________ repair.  I feel that it is a good time to march forward with reconstruction.  My plan would be to take out the hardware about his radius and ulna due to the prior incisions.  I would remove the hypertrophic granuloma, perform tenosynovectomy and remove any dead nonviable bony fragments, etc.  I have discussed with the patient that following this, his wrist will be no better off in terms of stability than it is now, thus the 2nd part of this operation would be to make incisions and place a dorsal wrist plate to span the wrist.  At the time of surgery, I would consider  c-Met spacing versus allograft bone graft with BMP.  I would not go directly to the hip for autograft due to the fact that I did not want to burn any bridges in case he is infected.  We will of course send cultures, etc. I have had a very long talk with the patient and his significant other [name] today in regard to the risks and benefits, do's and don'ts, and plans for the future.  I certainly cannot rule out possibly needing a vascularized fibular bone graft at tertiary referral center if our plan of care does not work, but I do feel that it is certainly time to march forward given his date of injury in August.  I will proceed accordingly and will past long and noticed all parties.  I have cleansed his arm today and noted the skin looks excellent and much improved from a week ago when I saw him.  Risks and benefits of bleeding, infection, anesthesia, damage to normal structures, and failure of surgery to accomplish its intended goals of relieving symptoms and restoring function were discussed with the patient at length.  With this in mind, we will proceed accordingly tomorrow following Dr. Nilsa Nutting hip reconstruction.  I will also note that the counseling time today was greater than 45 minutes to an hour, and we did discuss all the issues, activities, and plans for the future.     Dionne Ano. Amanda Pea, M.D.     [facility]  D:  10/01/2012  T:  10/02/2012  Job:  [id number]  cc:   Madlyn Frankel. Charlann Boxer, M.D. Doralee Albino. Carola Frost, M.D. Irena Reichmann, DO

## 2012-10-02 NOTE — Anesthesia Preprocedure Evaluation (Addendum)
Anesthesia Evaluation  Patient identified by MRN, date of birth, ID band Patient awake    Reviewed: Allergy & Precautions, H&P , NPO status , Patient's Chart, lab work & pertinent test results  Airway Mallampati: II TM Distance: >3 FB Neck ROM: full    Dental No notable dental hx. (+) Dental Advisory Given and Teeth Intact   Pulmonary sleep apnea , former smoker,  breath sounds clear to auscultation- rhonchi  Pulmonary exam normal       Cardiovascular hypertension, Pt. on medications - Past MI + dysrhythmias Atrial Fibrillation Rhythm:Regular Rate:Normal  Most recent ECG NSR   Neuro/Psych negative neurological ROS  negative psych ROS   GI/Hepatic negative GI ROS, Neg liver ROS,   Endo/Other  Hypothyroidism Morbid obesity  Renal/GU negative Renal ROS  negative genitourinary   Musculoskeletal negative musculoskeletal ROS (+)   Abdominal Normal abdominal exam  (+)   Peds  Hematology negative hematology ROS (+) Blood dyscrasia, anemia , Hgb. 10.6   Anesthesia Other Findings   Reproductive/Obstetrics negative OB ROS                        Anesthesia Physical Anesthesia Plan  ASA: III  Anesthesia Plan: General   Post-op Pain Management:    Induction: Intravenous  Airway Management Planned: Oral ETT  Additional Equipment:   Intra-op Plan:   Post-operative Plan: Extubation in OR  Informed Consent: I have reviewed the patients History and Physical, chart, labs and discussed the procedure including the risks, benefits and alternatives for the proposed anesthesia with the patient or authorized representative who has indicated his/her understanding and acceptance.   Dental advisory given  Plan Discussed with: CRNA and Surgeon  Anesthesia Plan Comments:        Anesthesia Quick Evaluation

## 2012-10-02 NOTE — Op Note (Signed)
See dictation # [id number] Dominica Severin MD

## 2012-10-02 NOTE — Transfer of Care (Signed)
Immediate Anesthesia Transfer of Care Note  Patient: [name]  Procedure(s) Performed: Procedure(s) (LRB) with comments: CONVERSION TO TOTAL HIP (Right) HARDWARE REMOVAL (Right) OPEN REDUCTION INTERNAL FIXATION (ORIF) WRIST FRACTURE (Right)  Patient Location: PACU  Anesthesia Type:General  Level of Consciousness: awake, alert  and oriented  Airway & Oxygen Therapy: Patient Spontanous Breathing and Patient connected to face mask oxygen  Post-op Assessment: Report given to PACU RN and Post -op Vital signs reviewed and stable  Post vital signs: Reviewed and stable  Complications: No apparent anesthesia complications

## 2012-10-02 NOTE — Progress Notes (Signed)
[facility] PROGRESS NOTE  [name] GNF:[id number] DOB: 1946-09-08 DOA: 09/28/2012 PCP: Ebbie Ridge, MD PCP - Dr. Thomasena Edis in [place]. Orthopedics--Dr. Amanda Pea  Assessment/Plan: 1. Right femoral fracture s/p mechanical fall--per orthopedics, for surgery at [facility] 12/23 and will stay there afterwards. Team 6 Dr. Barnie Del will take over at [facility] (discussed with him). 2. Right forearm fracture and wound--patient is on antibiotics for the same. Followed by Dr. Amanda Pea for the same. Surgery today. 3. Normocytic anemia--stable. Follow post-surgery. 4. UTI--E. coli. Completed Rocephin 12/22. 5. Atrial fibrillation--stable, in SR on EKG on admission. Xarelto on hold for sugery, orthopedics placed on heparin. Continue diltiazem, amiodarone. Can resume Xarelto post-op per orthopedics. 6. HTN--stable. 7. Hypothyroidism--TSH mildly high. Continue levothyroxine, increase dose if not done recently.   Code Status: full code Family Communication: none present Disposition Plan: SNF   Brendia Sacks, MD  [facility] Team 4 Pager [phone] If 7PM-7AM, please contact night-coverage at [email or web address], password [facility] 10/02/2012, 7:51 AM  LOS: 4 days   Brief narrative: 66 year old male with history of hypertension, atrial fibrillation, hyperlipidemia, hypothyroidism was brought to the ER patient had a fall at his house and experienced right hip pain. Patient has had a fracture of the same area in August this year and had undergone surgery. X-rays reveal periprosthetic fracture. Patient states that he slipped and fell did not lose consciousness. Denies any chest pain or shortness of breath. Patient has been admitted for further management. Orthopedics on call was consulted.  Consultants:  Orthopedics   PT--SNF  Procedures:    Antibiotics:  Rocephin 12/20 >>  Doxycycline on prior to admission  HPI/Subjective: No problems. Ready for surgery.  Objective: Filed Vitals:   10/01/12 0622 10/01/12 1705 10/01/12 2228 10/02/12 0550  BP: 142/63 155/53 159/64 158/72  Pulse: 82 85 78 76  Temp: 97.7 F (36.5 C) 97.9 F (36.6 C) 98.9 F (37.2 C) 97.7 F (36.5 C)  TempSrc:      Resp: 18 18 18 18   Height:      Weight:      SpO2: 99% 97% 98% 97%    Intake/Output Summary (Last 24 hours) at 10/02/12 0751 Last data filed at 10/02/12 0500  Gross per 24 hour  Intake 986.52 ml  Output   2000 ml  Net -1013.48 ml   Filed Weights   09/29/12 0300  Weight: 145.151 kg (320 lb)    Exam: General:  Appears calm and comfortable Cardiovascular: RRR, no m/r/g. No LE edema. Respiratory: CTA bilaterally, no w/r/r. Normal respiratory effort. Psychiatric: grossly normal mood and affect, speech fluent and appropriate  Exam current 12/23  Data Reviewed: Basic Metabolic Panel:  Lab 10/02/12 5284 10/01/12 0600 09/29/12 0700 09/28/12 2340  NA 132* 133* 135 132*  K 4.2 3.8 3.9 3.9  CL 95* 95* 99 97  CO2 28 27 26 24   GLUCOSE 127* 134* 125* 135*  BUN 20 20 19 20   CREATININE 0.91 0.93 1.04 1.13  CALCIUM 9.3 9.1 8.8 8.9  MG -- -- -- --  PHOS -- -- -- --   Liver Function Tests:  Lab 09/28/12 2340  AST 11  ALT 12  ALKPHOS 135*  BILITOT 0.3  PROT 6.4  ALBUMIN 2.9*   CBC:  Lab 10/02/12 0525 10/01/12 0600 09/29/12 0700 09/28/12 2340  WBC 7.5 6.2 7.2 9.3  NEUTROABS -- -- -- 5.9  HGB 10.6* 10.6* 9.9* 10.8*  HCT 33.2* 33.2* 31.0* 33.2*  MCV 84.1 83.6 84.0 82.4  PLT 297 269 255 277  Recent Results (from the past 240 hour(s))  URINE CULTURE     Status: Normal   Collection Time   09/29/12 12:21 AM      Component Value Range Status Comment   Specimen Description URINE, CLEAN CATCH   Final    Special Requests NONE   Final    Culture  Setup Time 09/29/2012 01:25   Final    Colony Count >=100,000 COLONIES/ML   Final    Culture ESCHERICHIA COLI   Final    Report Status 09/30/2012 FINAL   Final    Organism ID, Bacteria ESCHERICHIA COLI   Final   SURGICAL PCR  SCREEN     Status: Normal   Collection Time   09/30/12  7:58 PM      Component Value Range Status Comment   MRSA, PCR NEGATIVE  NEGATIVE Final    Staphylococcus aureus NEGATIVE  NEGATIVE Final      Studies: No results found.  Scheduled Meds:    . amiodarone  100 mg Oral Daily  . atorvastatin  80 mg Oral Daily  . cholecalciferol  2,000 Units Oral Daily  . diltiazem  180 mg Oral Daily  . doxycycline  100 mg Oral QHS  . DULoxetine  60 mg Oral Daily  . levothyroxine  200 mcg Oral QAC breakfast  . multivitamin with minerals  1 tablet Oral Daily  . pantoprazole  40 mg Oral Daily  . ranolazine  500 mg Oral BID  . sodium chloride  3 mL Intravenous Q12H  . Tamsulosin HCl  0.4 mg Oral Daily   Continuous Infusions:    . heparin 2,000 Units/hr (10/02/12 0431)    Principal Problem:  *Right femoral fracture Active Problems:  Right forearm fracture  Atrial fibrillation  Hypothyroidism  UTI (urinary tract infection)  Normocytic anemia  Low testosterone, possible hypogonadism    Brendia Sacks, MD  [facility] Team 4 Pager [phone] If 7PM-7AM, please contact night-coverage at [email or web address], password [facility] 10/02/2012, 7:51 AM  LOS: 4 days   Time spent: 15 minutes

## 2012-10-02 NOTE — Progress Notes (Signed)
Heparin drip d/cd prior to being transported to [facility].

## 2012-10-02 NOTE — Progress Notes (Signed)
UR COMPLETED  

## 2012-10-02 NOTE — Progress Notes (Signed)
CSW received referral for snf placement. Pt currently in OR at this time. Unit CSW will follow up to assess for SNF placement for short term rehab. .Clinical social worker continuing to follow pt to assist with pt dc plans and further csw needs.   Catha Gosselin, LCSWA  [phone] ED Evening CSW .10/02/2012 1718pm

## 2012-10-02 NOTE — Progress Notes (Signed)
PT Cancellation Note  Patient Details Name: [name] MRN: [id number] DOB: 02/19/1946   Cancelled Treatment:    Reason Eval/Treat Not Completed: Patient at procedure or test/unavailable. Patient scheduled for surgery at [facility] today. Will have PT follow up after surgery as appropriate.  Thanks 10/02/2012 Fredrich Birks PTA [phone] pager [phone] office      Fredrich Birks 10/02/2012, 7:11 AM

## 2012-10-02 NOTE — Progress Notes (Signed)
ANTICOAGULATION CONSULT NOTE - Follow Up Consult  Pharmacy Consult for heparin Indication: atrial fibrillation  Labs:  [facility] 10/02/12 0525 10/01/12 1230 10/01/12 0600  HGB 10.6* -- 10.6*  HCT 33.2* -- 33.2*  PLT 297 -- 269  APTT 78* 101* 83*  LABPROT -- -- 13.4  INR -- -- 1.03  HEPARINUNFRC 0.58 0.70 0.57  CREATININE 0.91 -- 0.93  CKTOTAL -- -- --  CKMB -- -- --  TROPONINI -- -- --    Assessment/Plan:  66yo male remains therapeutic on heparin.  Will continue gtt and continue to monitor.  Colleen Can PharmD BCPS 10/02/2012,7:14 AM

## 2012-10-03 DIAGNOSIS — S7290XA Unspecified fracture of unspecified femur, initial encounter for closed fracture: Secondary | ICD-10-CM

## 2012-10-03 DIAGNOSIS — F329 Major depressive disorder, single episode, unspecified: Secondary | ICD-10-CM | POA: Diagnosis present

## 2012-10-03 DIAGNOSIS — X58XXXA Exposure to other specified factors, initial encounter: Secondary | ICD-10-CM

## 2012-10-03 DIAGNOSIS — M1990 Unspecified osteoarthritis, unspecified site: Secondary | ICD-10-CM | POA: Diagnosis present

## 2012-10-03 DIAGNOSIS — K0510 Chronic gingivitis, plaque induced: Secondary | ICD-10-CM | POA: Diagnosis present

## 2012-10-03 DIAGNOSIS — E785 Hyperlipidemia, unspecified: Secondary | ICD-10-CM | POA: Diagnosis present

## 2012-10-03 DIAGNOSIS — F32A Depression, unspecified: Secondary | ICD-10-CM | POA: Diagnosis present

## 2012-10-03 DIAGNOSIS — K219 Gastro-esophageal reflux disease without esophagitis: Secondary | ICD-10-CM | POA: Diagnosis present

## 2012-10-03 DIAGNOSIS — I10 Essential (primary) hypertension: Secondary | ICD-10-CM | POA: Diagnosis present

## 2012-10-03 DIAGNOSIS — R739 Hyperglycemia, unspecified: Secondary | ICD-10-CM | POA: Diagnosis present

## 2012-10-03 DIAGNOSIS — E559 Vitamin D deficiency, unspecified: Secondary | ICD-10-CM | POA: Diagnosis present

## 2012-10-03 DIAGNOSIS — I2510 Atherosclerotic heart disease of native coronary artery without angina pectoris: Secondary | ICD-10-CM | POA: Diagnosis present

## 2012-10-03 DIAGNOSIS — L409 Psoriasis, unspecified: Secondary | ICD-10-CM | POA: Diagnosis present

## 2012-10-03 DIAGNOSIS — F419 Anxiety disorder, unspecified: Secondary | ICD-10-CM | POA: Diagnosis present

## 2012-10-03 LAB — CBC
Platelets: 261 10*3/uL (ref 150–400)
RBC: 3.77 MIL/uL — ABNORMAL LOW (ref 4.22–5.81)
WBC: 9.7 10*3/uL (ref 4.0–10.5)

## 2012-10-03 LAB — BASIC METABOLIC PANEL
CO2: 26 mEq/L (ref 19–32)
Calcium: 8.9 mg/dL (ref 8.4–10.5)
Chloride: 94 mEq/L — ABNORMAL LOW (ref 96–112)
GFR calc Af Amer: >90 mL/min (ref >90)
Sodium: 128 mEq/L — ABNORMAL LOW (ref 135–145)

## 2012-10-03 MED ORDER — VANCOMYCIN HCL 10 G IV SOLR
2500.0000 mg | Freq: Once | INTRAVENOUS | Status: AC
  Administered 2012-10-03: 2500 mg via INTRAVENOUS
  Filled 2012-10-03: qty 2500

## 2012-10-03 MED ORDER — VANCOMYCIN HCL 10 G IV SOLR
1500.0000 mg | Freq: Two times a day (BID) | INTRAVENOUS | Status: DC
  Administered 2012-10-04: 1500 mg via INTRAVENOUS
  Filled 2012-10-03: qty 1500

## 2012-10-03 MED ORDER — ENOXAPARIN SODIUM 40 MG/0.4ML ~~LOC~~ SOLN
40.0000 mg | SUBCUTANEOUS | Status: AC
  Administered 2012-10-03 – 2012-10-04 (×2): 40 mg via SUBCUTANEOUS
  Filled 2012-10-03 (×2): qty 0.4

## 2012-10-03 MED ORDER — DEXTROSE 5 % IV SOLN
500.0000 mg | Freq: Four times a day (QID) | INTRAVENOUS | Status: DC | PRN
  Administered 2012-10-03: 500 mg via INTRAVENOUS
  Filled 2012-10-03: qty 5

## 2012-10-03 MED ORDER — METHOCARBAMOL 500 MG PO TABS
500.0000 mg | ORAL_TABLET | Freq: Four times a day (QID) | ORAL | Status: DC | PRN
  Administered 2012-10-03 – 2012-10-07 (×10): 500 mg via ORAL
  Filled 2012-10-03 (×10): qty 1

## 2012-10-03 MED ORDER — DEXTROSE 5 % IV SOLN
2.0000 g | Freq: Two times a day (BID) | INTRAVENOUS | Status: DC
  Administered 2012-10-03 – 2012-10-06 (×6): 2 g via INTRAVENOUS
  Filled 2012-10-03 (×6): qty 2

## 2012-10-03 MED ORDER — OXYCODONE HCL 5 MG PO TABS
5.0000 mg | ORAL_TABLET | ORAL | Status: DC | PRN
  Administered 2012-10-03 (×2): 5 mg via ORAL
  Administered 2012-10-03 – 2012-10-05 (×11): 10 mg via ORAL
  Filled 2012-10-03 (×13): qty 2

## 2012-10-03 MED ORDER — HYDROMORPHONE HCL PF 1 MG/ML IJ SOLN
0.5000 mg | INTRAMUSCULAR | Status: DC | PRN
  Administered 2012-10-03 (×2): 1 mg via INTRAVENOUS
  Administered 2012-10-06: 2 mg via INTRAVENOUS
  Administered 2012-10-06: 1 mg via INTRAVENOUS
  Administered 2012-10-06 (×2): 2 mg via INTRAVENOUS
  Administered 2012-10-06: 1 mg via INTRAVENOUS
  Administered 2012-10-06: 2 mg via INTRAVENOUS
  Filled 2012-10-03: qty 2
  Filled 2012-10-03: qty 1
  Filled 2012-10-03: qty 2
  Filled 2012-10-03: qty 1
  Filled 2012-10-03 (×2): qty 2
  Filled 2012-10-03: qty 1

## 2012-10-03 NOTE — Consult Note (Signed)
[facility]    Date of Admission:  09/28/2012   Chronic doxycycline therapy        Day 2 cefazolin               Reason for Consult: Possible right wrist infection with osteomyelitis    Referring Physician: Dr. Onalee Hua  Principal Problem:  *Right femoral fracture Active Problems:  Right forearm fracture  Atrial fibrillation  Hypothyroidism  History of recurrent UTIs  Normocytic anemia  Low testosterone, possible hypogonadism  HTN (hypertension)  Psoriasis  Dyslipidemia  Vitamin D deficiency  GERD (gastroesophageal reflux disease)  Hyperglycemia  Anxiety  Depression  CAD (coronary artery disease)  DJD (degenerative joint disease)  Chronic gingivitis      . amiodarone  100 mg Oral Daily  . atorvastatin  80 mg Oral Daily  .  ceFAZolin (ANCEF) IV  2 g Intravenous Q8H  . cholecalciferol  2,000 Units Oral Daily  . diltiazem  180 mg Oral Daily  . docusate sodium  100 mg Oral BID  . doxycycline  100 mg Oral QHS  . DULoxetine  60 mg Oral Daily  . enoxaparin (LOVENOX) injection  40 mg Subcutaneous Q24H  . levothyroxine  200 mcg Oral QAC breakfast  . multivitamin with minerals  1 tablet Oral Daily  . pantoprazole  40 mg Oral Daily  . polyethylene glycol  17 g Oral Daily  . ranolazine  500 mg Oral BID  . sodium chloride  3 mL Intravenous Q12H  . Tamsulosin HCl  0.4 mg Oral Daily    Recommendations: 1. Change cefazolin to IV vancomycin and cefepime pending operative culture results   Assessment: Has a very complicated situation with his right wrist with extensive fractures prior surgery and nonunion. The operative note from yesterday indicated some necrotic tissue. Certainly infection may be one factor in the poor healing. Also note that he has vitamin D deficiency, has been a recent smoker up until 3 months ago, and has been taking Enbrel until one week ago for his psoriasis. I will broaden his empiric antibiotic coverage to vancomycin  and cefepime to cover her staff including MRSA and more resistant health care associated gram-negative rod infection. Dr. Enedina Finner [phone]) will check his culture results tomorrow and make any changes that might be needed. He will followup on Thursday, December 26.    HPI: [name] is a 66 y.o. male who fell last August suffering right wrist and right femur fractures. He underwent open reduction internal fixation of the fracture sites. He has had poor healing of his wrist fractures and was referred to see Dr. Amanda Pea last week. He had nonunion of the very complex fractures and very poor condition of the skin on the volar surface of his wrist was noted. He was admitted 4 days ago after he fell again and sustained a new, peri-prosthetic right hip fracture.  Yesterday he underwent right wrist surgery. He had all of the old hardware and foreign body bone grafts removed and extensive debridement and finally a new plate was placed. All operative Gram stain was negative for any organisms and cultures are pending. He has not had any fever that he is aware of. He has not had any chills or drenching sweats.  He has been on low-dose doxycycline for nearly 20 years for chronic gingivitis. He has been treated on many occasions for recurrent urinary tract infections. He was placed on ceftriaxone on admission for a urinary  tract infection but he has had no recent symptoms to suggest a symptomatic infection. After surgery yesterday he his ceftriaxone was changed to cefazolin.   Review of Systems: Pertinent items are noted in HPI.  Past Medical History  Diagnosis Date  . Hypertension   . Sleep apnea     History  Substance Use Topics  . Smoking status: Former Smoker    Quit date: 06/29/2012  . Smokeless tobacco: Not on file  . Alcohol Use: Yes     Comment: occasional    Family History  Problem Relation Age of Onset  . CAD Father    No Known Allergies  OBJECTIVE: Blood pressure 140/61,  pulse 88, temperature 99.3 F (37.4 C), temperature source Oral, resp. rate 16, height 6\' 4"  (1.93 m), weight 145.151 kg (320 lb), SpO2 93.00%. General: He is in good spirits talking on the phone. He is in no distress. Skin: No generalized rash Lungs: Clear Cor: Regular S1 and S2 no murmurs Abdomen: Obese, soft and nontender Extremities: His right hand is in a bulky dressing  Microbiology: Recent Results (from the past 240 hour(s))  URINE CULTURE     Status: Normal   Collection Time   09/29/12 12:21 AM      Component Value Range Status Comment   Specimen Description URINE, CLEAN CATCH   Final    Special Requests NONE   Final    Culture  Setup Time 09/29/2012 01:25   Final    Colony Count >=100,000 COLONIES/ML   Final    Culture ESCHERICHIA COLI   Final    Report Status 09/30/2012 FINAL   Final    Organism ID, Bacteria ESCHERICHIA COLI   Final   SURGICAL PCR SCREEN     Status: Normal   Collection Time   09/30/12  7:58 PM      Component Value Range Status Comment   MRSA, PCR NEGATIVE  NEGATIVE Final    Staphylococcus aureus NEGATIVE  NEGATIVE Final   MRSA PCR SCREENING     Status: Normal   Collection Time   10/02/12  3:25 PM      Component Value Range Status Comment   MRSA by PCR NEGATIVE  NEGATIVE Final   BODY FLUID CULTURE     Status: Normal (Preliminary result)   Collection Time   10/02/12  6:29 PM      Component Value Range Status Comment   Specimen Description FLUID RIGHT WRIST 1   Final    Special Requests NONE   Final    Gram Stain     Final    Value: RARE WBC PRESENT, PREDOMINANTLY MONONUCLEAR     NO ORGANISMS SEEN   Culture PENDING   Incomplete    Report Status PENDING   Incomplete   ANAEROBIC CULTURE     Status: Normal (Preliminary result)   Collection Time   10/02/12  6:29 PM      Component Value Range Status Comment   Specimen Description FLUID RIGHT WRIST 1   Final    Special Requests NONE   Final    Gram Stain     Final    Value: RARE WBC PRESENT,  PREDOMINANTLY MONONUCLEAR     NO ORGANISMS SEEN   Culture     Final    Value: NO ANAEROBES ISOLATED; CULTURE IN PROGRESS FOR 5 DAYS   Report Status PENDING   Incomplete   TISSUE CULTURE     Status: Normal (Preliminary result)   Collection Time  10/02/12  6:31 PM      Component Value Range Status Comment   Specimen Description WRIST RIGHT 1   Final    Special Requests NONE   Final    Gram Stain     Final    Value: NO WBC SEEN     NO ORGANISMS SEEN   Culture NO GROWTH   Final    Report Status PENDING   Incomplete   ANAEROBIC CULTURE     Status: Normal (Preliminary result)   Collection Time   10/02/12  6:31 PM      Component Value Range Status Comment   Specimen Description WRIST RIGHT 1   Final    Special Requests NONE   Final    Gram Stain     Final    Value: NO WBC SEEN     NO ORGANISMS SEEN   Culture     Final    Value: NO ANAEROBES ISOLATED; CULTURE IN PROGRESS FOR 5 DAYS   Report Status PENDING   Incomplete   FUNGUS CULTURE W SMEAR     Status: Normal (Preliminary result)   Collection Time   10/02/12  6:31 PM      Component Value Range Status Comment   Specimen Description WRIST RIGHT 1   Final    Special Requests NONE   Final    Fungal Smear NO YEAST OR FUNGAL ELEMENTS SEEN   Final    Culture CULTURE IN PROGRESS FOR FOUR WEEKS   Final    Report Status PENDING   Incomplete   BODY FLUID CULTURE     Status: Normal (Preliminary result)   Collection Time   10/02/12  6:48 PM      Component Value Range Status Comment   Specimen Description FLUID RIGHT WRIST 2   Final    Special Requests NONE   Final    Gram Stain     Final    Value: RARE WBC PRESENT,BOTH PMN AND MONONUCLEAR     NO ORGANISMS SEEN   Culture PENDING   Incomplete    Report Status PENDING   Incomplete   ANAEROBIC CULTURE     Status: Normal (Preliminary result)   Collection Time   10/02/12  6:48 PM      Component Value Range Status Comment   Specimen Description FLUID RIGHT WRIST 2   Final    Special  Requests NONE   Final    Gram Stain     Final    Value: RARE WBC PRESENT,BOTH PMN AND MONONUCLEAR     NO ORGANISMS SEEN   Culture     Final    Value: NO ANAEROBES ISOLATED; CULTURE IN PROGRESS FOR 5 DAYS   Report Status PENDING   Incomplete   TISSUE CULTURE     Status: Normal (Preliminary result)   Collection Time   10/02/12  6:51 PM      Component Value Range Status Comment   Specimen Description WRIST RIGHT 2   Final    Special Requests NONE   Final    Gram Stain     Final    Value: NO WBC SEEN     NO ORGANISMS SEEN   Culture NO GROWTH   Final    Report Status PENDING   Incomplete   ANAEROBIC CULTURE     Status: Normal (Preliminary result)   Collection Time   10/02/12  6:51 PM      Component Value Range Status Comment   Specimen Description  WRIST RIGHT 2   Final    Special Requests NONE   Final    Gram Stain     Final    Value: NO WBC SEEN     NO ORGANISMS SEEN   Culture     Final    Value: NO ANAEROBES ISOLATED; CULTURE IN PROGRESS FOR 5 DAYS   Report Status PENDING   Incomplete   BODY FLUID CULTURE     Status: Normal (Preliminary result)   Collection Time   10/02/12  7:34 PM      Component Value Range Status Comment   Specimen Description FLUID RIGHT WRIST 3   Final    Special Requests NONE   Final    Gram Stain     Final    Value: RARE WBC PRESENT,BOTH PMN AND MONONUCLEAR     NO ORGANISMS SEEN   Culture PENDING   Incomplete    Report Status PENDING   Incomplete   ANAEROBIC CULTURE     Status: Normal (Preliminary result)   Collection Time   10/02/12  7:34 PM      Component Value Range Status Comment   Specimen Description FLUID RIGHT WRIST 3   Final    Special Requests NONE   Final    Gram Stain     Final    Value: RARE WBC PRESENT,BOTH PMN AND MONONUCLEAR     NO ORGANISMS SEEN   Culture     Final    Value: NO ANAEROBES ISOLATED; CULTURE IN PROGRESS FOR 5 DAYS   Report Status PENDING   Incomplete     Cliffton Asters, MD [facility] [phone] pager   [phone] cell 10/03/2012, 1:57 PM

## 2012-10-03 NOTE — Progress Notes (Signed)
[facility] PROGRESS NOTE  [name]:[id number] DOB: 1946-01-09 DOA: 09/28/2012 PCP: Ebbie Ridge, MD PCP - Dr. Thomasena Edis in [place]. Orthopedics--Dr. Amanda Pea  Assessment/Plan: Right femoral fracture s/p mechanical fall Per orthopedics, surgery on 12/26.  Right forearm fracture and wound with hardware failure and nonunion of right radius and ulna.  Patient had removal of hardware, irrigation and debridement on 10/02/2012.  Patient placed on cefazolin per orthopedic service.  Ortho to recommend oral antibiotics at the time of discharge.  Wound cultures from 10/02/2012 pending.  Normocytic anemia Stable.  May have mild blood loss anemia from surgery and hip fracture.  Continue to monitor.    E. Coli UTI Completed Rocephin 12/20-12/22.  Atrial fibrillation Stable, in SR on EKG on admission. Xarelto on hold for surgery. Currently on Lovenox. Continue diltiazem, amiodarone. Can resume Xarelto post-op per orthopedics.  HTN Stable.  Continue diltiazem.  Hypothyroidism TSH mildly high at admission. Continue levothyroxine, defer to primary care physician for making any adjustments.  Obstructive sleep apnea Continue CPAP.  Hyponatremia Etiology unclear.  Likely related to pain.  Continue to monitor.  Appears to be chronic.  Code Status: Full code Family Communication: Patient's significant other present in the room and updated. Disposition Plan: SNF, possibly on 10/06/2012?  Brief narrative: 66 year old male with history of hypertension, atrial fibrillation, hyperlipidemia, hypothyroidism was brought to the ER patient had a fall at his house and experienced right hip pain. Patient has had a fracture of the same area in August this year and had undergone surgery. X-rays reveal periprosthetic fracture. Patient states that he slipped and fell did not lose consciousness. Denies any chest pain or shortness of breath. Patient has been admitted for further management.  Orthopedics on call was consulted.  Consultants:  Orthopedics   PT--SNF  Procedures:  Removal of hardware, irrigation and debridement of the right forearm on 10/02/2012.  Antibiotics:  Rocephin 12/20 >> 12/22  Doxycycline on prior to admission.  Cefazolin 10/03/2012 >>  HPI/Subjective: Complaining of pain in his right arm where he had surgery yesterday.  No other specific complaints.  Objective: Filed Vitals:   10/03/12 0130 10/03/12 0243 10/03/12 0321 10/03/12 0600  BP: 165/72 146/67  167/69  Pulse: 76 79 73 82  Temp: 97.7 F (36.5 C) 97.8 F (36.6 C)  97.7 F (36.5 C)  TempSrc:      Resp: 16 16 19 18   Height:      Weight:      SpO2: 100% 97% 96% 98%    Intake/Output Summary (Last 24 hours) at 10/03/12 1042 Last data filed at 10/03/12 0934  Gross per 24 hour  Intake   4030 ml  Output   3210 ml  Net    820 ml   Filed Weights   09/29/12 0300  Weight: 145.151 kg (320 lb)    Exam: Physical Exam: General: Awake, Oriented, No acute distress. HEENT: EOMI. Neck: Supple CV: S1 and S2 Lungs: Clear to ascultation bilaterally Abdomen: Soft, Nontender, Nondistended, +bowel sounds. Ext: Good pulses. Trace edema.  Right upper extremity in bandages.  Data Reviewed: Basic Metabolic Panel:  Lab 10/03/12 9562 10/02/12 0525 10/01/12 0600 09/29/12 0700 09/28/12 2340  NA 128* 132* 133* 135 132*  K 4.6 4.2 3.8 3.9 3.9  CL 94* 95* 95* 99 97  CO2 26 28 27 26 24   GLUCOSE 151* 127* 134* 125* 135*  BUN 17 20 20 19 20   CREATININE 0.84 0.91 0.93 1.04 1.13  CALCIUM 8.9 9.3 9.1 8.8 8.9  MG -- -- -- -- --  PHOS -- -- -- -- --   Liver Function Tests:  Lab 09/28/12 2340  AST 11  ALT 12  ALKPHOS 135*  BILITOT 0.3  PROT 6.4  ALBUMIN 2.9*   CBC:  Lab 10/03/12 0438 10/02/12 0525 10/01/12 0600 09/29/12 0700 09/28/12 2340  WBC 9.7 7.5 6.2 7.2 9.3  NEUTROABS -- -- -- -- 5.9  HGB 10.1* 10.6* 10.6* 9.9* 10.8*  HCT 31.1* 33.2* 33.2* 31.0* 33.2*  MCV 82.5 84.1 83.6  84.0 82.4  PLT 261 297 269 255 277    Recent Results (from the past 240 hour(s))  URINE CULTURE     Status: Normal   Collection Time   09/29/12 12:21 AM      Component Value Range Status Comment   Specimen Description URINE, CLEAN CATCH   Final    Special Requests NONE   Final    Culture  Setup Time 09/29/2012 01:25   Final    Colony Count >=100,000 COLONIES/ML   Final    Culture ESCHERICHIA COLI   Final    Report Status 09/30/2012 FINAL   Final    Organism ID, Bacteria ESCHERICHIA COLI   Final   SURGICAL PCR SCREEN     Status: Normal   Collection Time   09/30/12  7:58 PM      Component Value Range Status Comment   MRSA, PCR NEGATIVE  NEGATIVE Final    Staphylococcus aureus NEGATIVE  NEGATIVE Final   MRSA PCR SCREENING     Status: Normal   Collection Time   10/02/12  3:25 PM      Component Value Range Status Comment   MRSA by PCR NEGATIVE  NEGATIVE Final   BODY FLUID CULTURE     Status: Normal (Preliminary result)   Collection Time   10/02/12  6:29 PM      Component Value Range Status Comment   Specimen Description FLUID RIGHT WRIST 1   Final    Special Requests NONE   Final    Gram Stain     Final    Value: RARE WBC PRESENT, PREDOMINANTLY MONONUCLEAR     NO ORGANISMS SEEN   Culture PENDING   Incomplete    Report Status PENDING   Incomplete   ANAEROBIC CULTURE     Status: Normal (Preliminary result)   Collection Time   10/02/12  6:29 PM      Component Value Range Status Comment   Specimen Description FLUID RIGHT WRIST 1   Final    Special Requests NONE   Final    Gram Stain     Final    Value: RARE WBC PRESENT, PREDOMINANTLY MONONUCLEAR     NO ORGANISMS SEEN   Culture PENDING   Incomplete    Report Status PENDING   Incomplete   TISSUE CULTURE     Status: Normal (Preliminary result)   Collection Time   10/02/12  6:31 PM      Component Value Range Status Comment   Specimen Description WRIST RIGHT 1   Final    Special Requests NONE   Final    Gram Stain     Final     Value: NO WBC SEEN     NO ORGANISMS SEEN   Culture NO GROWTH   Final    Report Status PENDING   Incomplete   ANAEROBIC CULTURE     Status: Normal (Preliminary result)   Collection Time   10/02/12  6:31 PM  Component Value Range Status Comment   Specimen Description WRIST RIGHT 1   Final    Special Requests NONE   Final    Gram Stain     Final    Value: NO WBC SEEN     NO ORGANISMS SEEN   Culture PENDING   Incomplete    Report Status PENDING   Incomplete   BODY FLUID CULTURE     Status: Normal (Preliminary result)   Collection Time   10/02/12  6:48 PM      Component Value Range Status Comment   Specimen Description FLUID RIGHT WRIST 2   Final    Special Requests NONE   Final    Gram Stain     Final    Value: RARE WBC PRESENT,BOTH PMN AND MONONUCLEAR     NO ORGANISMS SEEN   Culture PENDING   Incomplete    Report Status PENDING   Incomplete   ANAEROBIC CULTURE     Status: Normal (Preliminary result)   Collection Time   10/02/12  6:48 PM      Component Value Range Status Comment   Specimen Description FLUID RIGHT WRIST 2   Final    Special Requests NONE   Final    Gram Stain     Final    Value: RARE WBC PRESENT,BOTH PMN AND MONONUCLEAR     NO ORGANISMS SEEN   Culture PENDING   Incomplete    Report Status PENDING   Incomplete   TISSUE CULTURE     Status: Normal (Preliminary result)   Collection Time   10/02/12  6:51 PM      Component Value Range Status Comment   Specimen Description WRIST RIGHT 2   Final    Special Requests NONE   Final    Gram Stain     Final    Value: NO WBC SEEN     NO ORGANISMS SEEN   Culture NO GROWTH   Final    Report Status PENDING   Incomplete   ANAEROBIC CULTURE     Status: Normal (Preliminary result)   Collection Time   10/02/12  6:51 PM      Component Value Range Status Comment   Specimen Description WRIST RIGHT 2   Final    Special Requests NONE   Final    Gram Stain     Final    Value: NO WBC SEEN     NO ORGANISMS SEEN   Culture  PENDING   Incomplete    Report Status PENDING   Incomplete   BODY FLUID CULTURE     Status: Normal (Preliminary result)   Collection Time   10/02/12  7:34 PM      Component Value Range Status Comment   Specimen Description FLUID RIGHT WRIST 3   Final    Special Requests NONE   Final    Gram Stain     Final    Value: RARE WBC PRESENT,BOTH PMN AND MONONUCLEAR     NO ORGANISMS SEEN   Culture PENDING   Incomplete    Report Status PENDING   Incomplete   ANAEROBIC CULTURE     Status: Normal (Preliminary result)   Collection Time   10/02/12  7:34 PM      Component Value Range Status Comment   Specimen Description FLUID RIGHT WRIST 3   Final    Special Requests NONE   Final    Gram Stain     Final  Value: RARE WBC PRESENT,BOTH PMN AND MONONUCLEAR     NO ORGANISMS SEEN   Culture PENDING   Incomplete    Report Status PENDING   Incomplete      Studies: No results found.  Scheduled Meds:    . amiodarone  100 mg Oral Daily  . atorvastatin  80 mg Oral Daily  .  ceFAZolin (ANCEF) IV  2 g Intravenous Q8H  . cholecalciferol  2,000 Units Oral Daily  . diltiazem  180 mg Oral Daily  . docusate sodium  100 mg Oral BID  . doxycycline  100 mg Oral QHS  . DULoxetine  60 mg Oral Daily  . levothyroxine  200 mcg Oral QAC breakfast  . multivitamin with minerals  1 tablet Oral Daily  . pantoprazole  40 mg Oral Daily  . polyethylene glycol  17 g Oral Daily  . ranolazine  500 mg Oral BID  . sodium chloride  3 mL Intravenous Q12H  . Tamsulosin HCl  0.4 mg Oral Daily   Continuous Infusions:    . sodium chloride      Principal Problem:  *Right femoral fracture Active Problems:  Right forearm fracture  Atrial fibrillation  Hypothyroidism  UTI (urinary tract infection)  Normocytic anemia  Low testosterone, possible hypogonadism    Karlissa Aron A, MD  Pager [phone] If 8PM-8AM, please contact night-coverage at [email or web address], password [email or web address] 10/03/2012, 10:42 AM  LOS: 5 days   Time  spent: 20 minutes

## 2012-10-03 NOTE — Progress Notes (Signed)
Clinical Social Work Department BRIEF PSYCHOSOCIAL ASSESSMENT 10/03/2012  Patient:  [name], [name]     Account Number:  [id number]     Admit date:  09/28/2012  Clinical Social Worker:  Orpah Greek  Date/Time:  10/03/2012 01:10 PM  Referred by:  Physician  Date Referred:  10/03/2012 Referred for  SNF Placement   Other Referral:   Interview type:  Patient Other interview type:    PSYCHOSOCIAL DATA Living Status:  FRIEND(S) Admitted from facility:   Level of care:   Primary support name:  [name] (significant other) h#: [phone] c#: [phone] Primary support relationship to patient:  FRIEND Degree of support available:   good    CURRENT CONCERNS Current Concerns  Post-Acute Placement   Other Concerns:    SOCIAL WORK ASSESSMENT / PLAN CSW spoke with patient & significant other, Michael at bedside re: discharge planning. Note PT recommending SNF, patient is agreeable to SNF.   Assessment/plan status:  Information/Referral to [facility] Other assessment/ plan:   Information/referral to community resources:   CSW completed FL2 and faxed information out to [facility] - will follow-up Thursday with bed offers.    PATIENT'S/FAMILY'S RESPONSE TO PLAN OF CARE: Patient is agreeable with SNF placement - expressed interest in [place], [place] [facility].        Unice Bailey, LCSW [facility] Clinical Social Worker cell #: [phone]

## 2012-10-03 NOTE — Progress Notes (Signed)
PT Cancellation Note  Patient Details Name: [name] MRN: [id number] DOB: 27-Apr-1946   Cancelled Treatment:    Reason Eval/Treat Not Completed: Patient not medically ready.  Pt had complicated ORIF wrist surgery yesterday and is scheduled to have hip surgery on Thursday 12/26.  Will evaluate pts mobility at that point.    Thanks,    Page, Meribeth Mattes 10/03/2012, 8:36 AM

## 2012-10-03 NOTE — Progress Notes (Signed)
CSW will follow-up Thursday with bed offers.    Clinical Social Work Department CLINICAL SOCIAL WORK PLACEMENT NOTE 10/03/2012  Patient:  [name], [name]  Account Number:  [id number] Admit date:  09/28/2012  Clinical Social Worker:  Orpah Greek  Date/time:  10/03/2012 01:29 PM  Clinical Social Work is seeking post-discharge placement for this patient at the following level of care:   SKILLED NURSING   (*CSW will update this form in Epic as items are completed)   10/03/2012  Patient/family provided with [facility] Department of Clinical Social Work's list of facilities offering this level of care within the geographic area requested by the patient (or if unable, by the patient's family).  10/03/2012  Patient/family informed of their freedom to choose among providers that offer the needed level of care, that participate in Medicare, Medicaid or managed care program needed by the patient, have an available bed and are willing to accept the patient.  10/03/2012  Patient/family informed of [facility]' ownership interest in [facility], as well as of the fact that they are under no obligation to receive care at this facility.  PASARR submitted to [employer] on 10/03/2012 PASARR number received from [employer] on 10/03/2012  FL2 transmitted to all facilities in geographic area requested by pt/family on  10/03/2012 FL2 transmitted to all facilities within larger geographic area on   Patient informed that his/her managed care company has contracts with or will negotiate with  certain facilities, including the following:     Patient/family informed of bed offers received:   Patient chooses bed at  Physician recommends and patient chooses bed at    Patient to be transferred to  on   Patient to be transferred to facility by   The following physician request were entered in Epic:   Additional Comments:  Unice Bailey, LCSW [facility] Clinical Social  Worker cell #: [phone]

## 2012-10-03 NOTE — Progress Notes (Signed)
ANTIBIOTIC CONSULT NOTE - INITIAL  Pharmacy Consult for: vancomycin Indication: r/o osteomyelitis R wrist  No Known Allergies  Patient Measurements: Height: 6\' 4"  (193 cm) Weight: 320 lb (145.151 kg) IBW/kg (Calculated) : 86.8    Vital Signs: Temp: 99.3 F (37.4 C) (12/24 1117) Temp src: Oral (12/24 1117) BP: 140/61 mmHg (12/24 1117) Pulse Rate: 88  (12/24 1117) Intake/Output from previous day: 12/23 0701 - 12/24 0700 In: 3670 [P.O.:120; I.V.:3450; IV Piggyback:100] Out: 1410 [Urine:1400; Drains:10] Intake/Output from this shift: Total I/O In: 360 [P.O.:360] Out: 2100 [Urine:2100]  Labs:  [facility] 10/03/12 0438 10/02/12 0525 10/01/12 0600  WBC 9.7 7.5 6.2  HGB 10.1* 10.6* 10.6*  PLT 261 297 269  LABCREA -- -- --  CREATININE 0.84 0.91 0.93   Estimated Creatinine Clearance: 133 ml/min (by C-G formula based on Cr of 0.84).  Normalized Creatinine Clearance: ~ 87 mL/min/ 72kg No results found for this basename: VANCOTROUGH:2,VANCOPEAK:2,VANCORANDOM:2,GENTTROUGH:2,GENTPEAK:2,GENTRANDOM:2,TOBRATROUGH:2,TOBRAPEAK:2,TOBRARND:2,AMIKACINPEAK:2,AMIKACINTROU:2,AMIKACIN:2, in the last 72 hours   Microbiology: Recent Results (from the past 720 hour(s))  URINE CULTURE     Status: Normal   Collection Time   09/29/12 12:21 AM      Component Value Range Status Comment   Specimen Description URINE, CLEAN CATCH   Final    Special Requests NONE   Final    Culture  Setup Time 09/29/2012 01:25   Final    Colony Count >=100,000 COLONIES/ML   Final    Culture ESCHERICHIA COLI   Final    Report Status 09/30/2012 FINAL   Final    Organism ID, Bacteria ESCHERICHIA COLI   Final   SURGICAL PCR SCREEN     Status: Normal   Collection Time   09/30/12  7:58 PM      Component Value Range Status Comment   MRSA, PCR NEGATIVE  NEGATIVE Final    Staphylococcus aureus NEGATIVE  NEGATIVE Final   MRSA PCR SCREENING     Status: Normal   Collection Time   10/02/12  3:25 PM      Component Value  Range Status Comment   MRSA by PCR NEGATIVE  NEGATIVE Final   BODY FLUID CULTURE     Status: Normal (Preliminary result)   Collection Time   10/02/12  6:29 PM      Component Value Range Status Comment   Specimen Description FLUID RIGHT WRIST 1   Final    Special Requests NONE   Final    Gram Stain     Final    Value: RARE WBC PRESENT, PREDOMINANTLY MONONUCLEAR     NO ORGANISMS SEEN   Culture PENDING   Incomplete    Report Status PENDING   Incomplete   ANAEROBIC CULTURE     Status: Normal (Preliminary result)   Collection Time   10/02/12  6:29 PM      Component Value Range Status Comment   Specimen Description FLUID RIGHT WRIST 1   Final    Special Requests NONE   Final    Gram Stain     Final    Value: RARE WBC PRESENT, PREDOMINANTLY MONONUCLEAR     NO ORGANISMS SEEN   Culture     Final    Value: NO ANAEROBES ISOLATED; CULTURE IN PROGRESS FOR 5 DAYS   Report Status PENDING   Incomplete   TISSUE CULTURE     Status: Normal (Preliminary result)   Collection Time   10/02/12  6:31 PM      Component Value Range Status Comment   Specimen  Description WRIST RIGHT 1   Final    Special Requests NONE   Final    Gram Stain     Final    Value: NO WBC SEEN     NO ORGANISMS SEEN   Culture NO GROWTH   Final    Report Status PENDING   Incomplete   ANAEROBIC CULTURE     Status: Normal (Preliminary result)   Collection Time   10/02/12  6:31 PM      Component Value Range Status Comment   Specimen Description WRIST RIGHT 1   Final    Special Requests NONE   Final    Gram Stain     Final    Value: NO WBC SEEN     NO ORGANISMS SEEN   Culture     Final    Value: NO ANAEROBES ISOLATED; CULTURE IN PROGRESS FOR 5 DAYS   Report Status PENDING   Incomplete   FUNGUS CULTURE W SMEAR     Status: Normal (Preliminary result)   Collection Time   10/02/12  6:31 PM      Component Value Range Status Comment   Specimen Description WRIST RIGHT 1   Final    Special Requests NONE   Final    Fungal Smear  NO YEAST OR FUNGAL ELEMENTS SEEN   Final    Culture CULTURE IN PROGRESS FOR FOUR WEEKS   Final    Report Status PENDING   Incomplete   BODY FLUID CULTURE     Status: Normal (Preliminary result)   Collection Time   10/02/12  6:48 PM      Component Value Range Status Comment   Specimen Description FLUID RIGHT WRIST 2   Final    Special Requests NONE   Final    Gram Stain     Final    Value: RARE WBC PRESENT,BOTH PMN AND MONONUCLEAR     NO ORGANISMS SEEN   Culture PENDING   Incomplete    Report Status PENDING   Incomplete   ANAEROBIC CULTURE     Status: Normal (Preliminary result)   Collection Time   10/02/12  6:48 PM      Component Value Range Status Comment   Specimen Description FLUID RIGHT WRIST 2   Final    Special Requests NONE   Final    Gram Stain     Final    Value: RARE WBC PRESENT,BOTH PMN AND MONONUCLEAR     NO ORGANISMS SEEN   Culture     Final    Value: NO ANAEROBES ISOLATED; CULTURE IN PROGRESS FOR 5 DAYS   Report Status PENDING   Incomplete   TISSUE CULTURE     Status: Normal (Preliminary result)   Collection Time   10/02/12  6:51 PM      Component Value Range Status Comment   Specimen Description WRIST RIGHT 2   Final    Special Requests NONE   Final    Gram Stain     Final    Value: NO WBC SEEN     NO ORGANISMS SEEN   Culture NO GROWTH   Final    Report Status PENDING   Incomplete   ANAEROBIC CULTURE     Status: Normal (Preliminary result)   Collection Time   10/02/12  6:51 PM      Component Value Range Status Comment   Specimen Description WRIST RIGHT 2   Final    Special Requests NONE   Final  Gram Stain     Final    Value: NO WBC SEEN     NO ORGANISMS SEEN   Culture     Final    Value: NO ANAEROBES ISOLATED; CULTURE IN PROGRESS FOR 5 DAYS   Report Status PENDING   Incomplete   BODY FLUID CULTURE     Status: Normal (Preliminary result)   Collection Time   10/02/12  7:34 PM      Component Value Range Status Comment   Specimen Description FLUID  RIGHT WRIST 3   Final    Special Requests NONE   Final    Gram Stain     Final    Value: RARE WBC PRESENT,BOTH PMN AND MONONUCLEAR     NO ORGANISMS SEEN   Culture PENDING   Incomplete    Report Status PENDING   Incomplete   ANAEROBIC CULTURE     Status: Normal (Preliminary result)   Collection Time   10/02/12  7:34 PM      Component Value Range Status Comment   Specimen Description FLUID RIGHT WRIST 3   Final    Special Requests NONE   Final    Gram Stain     Final    Value: RARE WBC PRESENT,BOTH PMN AND MONONUCLEAR     NO ORGANISMS SEEN   Culture     Final    Value: NO ANAEROBES ISOLATED; CULTURE IN PROGRESS FOR 5 DAYS   Report Status PENDING   Incomplete     Medical History: Past Medical History  Diagnosis Date  . Hypertension   . Sleep apnea     Medications:  Scheduled:    . amiodarone  100 mg Oral Daily  . atorvastatin  80 mg Oral Daily  . ceFEPime (MAXIPIME) IV  2 g Intravenous Q12H  . cholecalciferol  2,000 Units Oral Daily  . diltiazem  180 mg Oral Daily  . docusate sodium  100 mg Oral BID  . doxycycline  100 mg Oral QHS  . DULoxetine  60 mg Oral Daily  . enoxaparin (LOVENOX) injection  40 mg Subcutaneous Q24H  . levothyroxine  200 mcg Oral QAC breakfast  . multivitamin with minerals  1 tablet Oral Daily  . pantoprazole  40 mg Oral Daily  . polyethylene glycol  17 g Oral Daily  . ranolazine  500 mg Oral BID  . sodium chloride  3 mL Intravenous Q12H  . Tamsulosin HCl  0.4 mg Oral Daily  . [DISCONTINUED]  ceFAZolin (ANCEF) IV  2 g Intravenous Q8H   Infusions:    . sodium chloride 75 mL/hr at 10/03/12 1240  . [DISCONTINUED] lactated ringers    . [DISCONTINUED] lactated ringers 125 mL/hr at 10/02/12 2306   PRN: acetaminophen, acetaminophen, clonazePAM, HYDROmorphone (DILAUDID) injection, methocarbamol (ROBAXIN) IV, methocarbamol, ondansetron (ZOFRAN) IV, ondansetron, oxyCODONE, oxyCODONE-acetaminophen, [DISCONTINUED] acetaminophen, [DISCONTINUED]   HYDROmorphone (DILAUDID) injection, [DISCONTINUED]  HYDROmorphone (DILAUDID) injection, [DISCONTINUED]  HYDROmorphone (DILAUDID) injection, [DISCONTINUED]  HYDROmorphone (DILAUDID) injection [DISCONTINUED] meperidine (DEMEROL) injection, [DISCONTINUED] oxyCODONE, [DISCONTINUED] oxyCODONE, [DISCONTINUED] oxyCODONE, [DISCONTINUED] promethazine, [DISCONTINUED] sodium chloride irrigation, [DISCONTINUED] sodium chloride irrigation, [DISCONTINUED] tobramycin, [DISCONTINUED] vancomycin  Assessment: 66 y/o M with suspected osteomyelitis at site of R wrist fracture and surgery, to begin empiric vancomycin and cefepime; pharmacy assistance with vancomycin dosing was requested by ID.  Goal of Therapy:  Vancomycin trough 15-20 Eradication of infection  Plan:  1. Vancomycin 2500 mg IV loading dose x 1, then . . . 2. Vancomycin 1500 mg IV q12h 3. Check trough at  steady-state. 4. Follow serum creatinine, cultures, clinical course.  Elie Goody, PharmD, BCPS Pager: [phone] 10/03/2012  2:46 PM

## 2012-10-03 NOTE — Progress Notes (Signed)
I have seen and examined the patient. I agree with the findings above.  Delsa Walder H, MD  

## 2012-10-03 NOTE — Progress Notes (Signed)
PHARMACY BRIEF NOTE:  Patient currently off anticoagulation following ORIF R wrist fracture.  Plans noted for return to OR 12/26 for hip surgery.  Patient was on chronic anticoagulation with Xarelto prior to admission for atrial fibrillation.   This was transitioned to a heparin infusion preoperatively at [facility].  Please advise on whether patient can safely resume a short-acting anticoagulant between now and the time of his next planned surgery.  Elie Goody, PharmD, BCPS Pager: [phone] 10/03/2012  11:52 AM

## 2012-10-03 NOTE — Progress Notes (Signed)
Subjective: 1 Day Post-Op Procedure(s) (LRB): OPEN REDUCTION INTERNAL FIXATION (ORIF) WRIST FRACTURE (Right) Patient reports pain as mild He is doing well without complaints Good sensation and motion   Objective: Vital signs in last 24 hours: Temp:  [97.4 F (36.3 C)-99.3 F (37.4 C)] 99 F (37.2 C) (12/24 1800) Pulse Rate:  [73-96] 95  (12/24 1800) Resp:  [13-19] 18  (12/24 1800) BP: (129-171)/(56-84) 146/66 mmHg (12/24 1800) SpO2:  [93 %-100 %] 96 % (12/24 1800)  Intake/Output from previous day: 12/23 0701 - 12/24 0700 In: 3670 [P.O.:120; I.V.:3450; IV Piggyback:100] Out: 1410 [Urine:1400; Drains:10] Intake/Output this shift:     Basename 10/03/12 0438 10/02/12 0525 10/01/12 0600  HGB 10.1* 10.6* 10.6*    Basename 10/03/12 0438 10/02/12 0525  WBC 9.7 7.5  RBC 3.77* 3.95*  HCT 31.1* 33.2*  PLT 261 297    Basename 10/03/12 0438 10/02/12 0525  NA 128* 132*  K 4.6 4.2  CL 94* 95*  CO2 26 28  BUN 17 20  CREATININE 0.84 0.91  GLUCOSE 151* 127*  CALCIUM 8.9 9.3    Basename 10/01/12 0600  LABPT --  INR 1.03    Exam: Sensory and motor intact with good pain control and function Nl refill and soft compartments  .Marland KitchenThe patient is alert and oriented in no acute distress the patient complains of pain in the affected upper extremity. The patient is noted to have a normal HEENT exam. Lung fields show equal chest expansion and no shortness of breath abdomen exam is nontender without distention. Lower extremity examination does not show blood clot symptoms. Pelvis is stable neck and back are stable and nontender  Assessment/Plan: 1 Day Post-Op Procedure(s) (LRB): OPEN REDUCTION INTERNAL FIXATION (ORIF) WRIST FRACTURE (Right) Looks well ID consult initiated Cont post op mgt Plan IV pain mgt and ABX Await CX and right hip reconstruction Discussed with patient all issues and plans Looks remarkably well  [name],[name] 10/03/2012, 9:08 PM

## 2012-10-03 NOTE — Op Note (Signed)
NAMESTANISLAW, Lane              ACCOUNT NO.:  [id number]  MEDICAL RECORD NO.:  [id number]  LOCATION:  1603                         FACILITY:  [facility]  PHYSICIAN:  Joseph Ano. 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  This is clearly status post open fracture sustained in August 2013.  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  This is clearly status post open fracture sustained in August 2013.  PROCEDURE: 1. Hardware removal radius deep in location in the form of a Stryker     plate. 2. Hardware removal, right ulna to a separate incision.  This is in     the form of a 3.5 DCP plate. 3. Irrigation and debridement of skin and subcutaneous tissue, bone     and tendinous architecture, right radius to bone radial incision.     Excisional debridement of skin and subcutaneous tissue, bone,     tendon, muscle, and associated structures. 4. Irrigation and debridement of skin, subcutaneous tissue, bone,     tendon, muscle, right ulna through an ulnar incision laterally.     Excisional debridement of skin and subcutaneous tissue, bone,     tendon, muscle, and associated structures. 5. Median nerve neurolysis and decompression about the proximal aspect     of the forearm, right wrist. 6. Flexor pollicis longus tenolysis, synovectomy, right wrist. 7. Flexor carpi radialis tenosynovectomy and tenolysis, right wrist. 8. __________ procedure, resection of distal ulna, right wrist. 9. Foreign body removal of bone graft from the Stryker line.   This was     a calcium phosphate __________ all pieces which were multiple and     removed roughly 5-7 foreign bodies which were large 1 cm chunks of     the calcium derivative. 10.Placement of a spanning plate about the wrist in the form of a     Biomet extended 2.7 plate. 11.Placement of antibiotic beads. 12.Stress radiography. 13.EPL tenolysis, tenosynovectomy. 14.ECRB, ECRL, and associated EPL and EPB tenolysis,     tenosynovectomy/radical tenosynovectomy of the extensor apparatus. 15.Superficial radial nerve neurolysis.  SURGEON:  Joseph Ano. Joseph Pea, MD  ASSISTANT:  Joseph Chimera, PA-C  COMPLICATIONS:  None.  ANESTHESIA:  General.  TOURNIQUET TIME:  Less than 2 hours.  DRAINS:  One __________.  INDICATIONS:  The patient is a 66 year old male with multiple medical issues.  I have counseled he and his family in regard to risks and benefits of bleeding, infection, anesthesia, damage to normal structures, failure to surgery to accomplish its intended goals of relieving symptoms and __________ restoring function.  With this in mind, he desires to proceed.  All questions have been encouraged and answered preoperatively.  __________ this patient is noted to have a injury in August.  This was an open fracture about the ulna.  He had  I and D and plate positioning. He presented to my office following referral from Dr. Myrene Lane after his index procedure was performed by Dr. __________.  Dr. Carola Lane __________ of the case and we __________ discussed with him the risks, benefits __________ best option is to removing the hardware, evaluate the area, obtain multiple cultures and see what we have to __________. __________ infection, I have given the issues to be discussed and I feel that this may be something __________.  I have discussed with him the issues, risks, and benefits, do's and don'ts, etc.  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  I have discussed with him that he may ultimately need a vestibular bone graft __________ or other advanced treatment process.  These are hopes that we can obtain __________ and perform a __________ procedure with associated spanning plate fixation and removal of the infected tissue.  After this period of time, it was our hope __________ risks but I do not feel that __________ would be normal in any __________.  __________ open conversation with him in regard to this.  Most important task is to get rid of any infectious source and then hopefully we will try to give him a stable wrist which ultimately would __________ if possible.  OPERATION IN DETAIL:  The patient was seen by myself and anesthesia, __________ his arm and hip were marked.  The patient understood the risk and benefits and decided to proceed.  Once in the operating room, he was given general anesthetic __________ in usual sterile fashion with Betadine scrub and paint.  I performed a Hibiclens scrub followed by Betadine scrub and paint, outlined the sterile field and once this was complete, the operation commenced.  I should note that preoperatively I actually cleansed his arms Sunday night at the hospital myself and bandaged him.  I was very careful.  We tried to get the skin ready for surgery.  I felt good about how the skin was ready for surgery and how he had made marked improvement since I initially saw him in the office. Once this was all complete and __________, I made an incision __________ radially.  He had a hypertrophic granuloma and I incised this. Dissection was carried down.  It was very apparent that there was lot of inflammatory tissue.  I performed __________ tenolysis with tenosynovectomy.  Once this was done, I performed a FPL tenolysis, tenosynovectomy to my satisfaction.  Once this was complete, I noted the marked and very robust scar  tissue and felt that it would be necessary to decompress the nerve in its location, so that we could ensure that he did not have an iatrogenic __________ median nerve and I performed a neurolysis and decompression into the __________.  He tolerated this reasonably well without complicating features.  The nerve was carefully detected at all times.  It was swollen and bulbous in nature but certainly was __________ and free from the area of dissection. Following this, I dissected down and performed a very careful __________ wrist.  The hardware was loose.  I excised the hardware following excision of the hardware.  I then performed I and D __________ tissue, bone, tendon, muscle tissue.  I should note that __________ both fluid and tissue cultures.  Following this, I also took bone cultures and additional fluid cultures.  I performed __________ through and through the area of question.  I got down to a nice bleeding bed of tissue and did this through the tourniquet deflated.  Tourniquet time was less than an hour at this juncture.  Once this was complete and I and D performed, I then turned attention towards the ulna and incision was made in the __________ dissection was carried out in a separate incision and __________.  This was deep hardware excision about the ulna.  Following this, I performed I and D __________ tissue, bone, and __________ tissue to my satisfaction.  He tolerated this well.  This was an excisional debridement with __________ curette as was the excisional debridement in the radius region.  Following this, I then noted that the patient was __________ amount of healing, poor quality of the bone and the unusual hardware in the area of question.  The patient at this time had a distal ulnar resection with oscillating saw followed by removal of the devitalized and necrotic looking __________ tissue.  There was portion of __________ poor union and thus I performed a __________  resection.  __________ at this time were that the patient will be better served with a __________ procedure as this would allow some __________ of the radius and the carpal bones which were quite malaligned and eaten away with the process.  I felt this would give him placement of better chance of having a symptoms of a normal wrist.  I felt the patient __________ toward weaning.  I feel this is absolutely __________ distal radial __________ also distal radius and the encroachment into the carpal bones.  Following this, I then performed placement of very good area __________ bleeders.  At this time, I removed 5-7 foreign bodies __________ foreign bodies consisting of calcium phosphate __________.  Surrounding these areas was a soft __________ capsule.  I very carefully removed __________ capsule and then I and D'd these areas.  I performed an additional culture in this region about the ulna as well.  __________ foreign bodies were removed from the radial incision and the ulnar incision.  Following this, I then turned attention toward the volar radial wound once again.  The scaphoid was exposed.  The lunate was partially exposed and certainly there was __________ tissue in my estimation.  The patient had no __________ of the distal radius.  The patient did have some __________ ulnarly and dorsally and in an effort to try to keep any meaningful bone with soft tissue __________, I curettaged aggressively and did not detach.  I removed __________ foreign body and with combination of rongeur, curette, and scissor removed a lot of __________ and poor quality deep tissue.  The patient had a honey crusted type of __________.  It is really difficult to ascertain whether this was truly infection or foreign body reaction from the implant of bone graft but nevertheless we were very aggressive about the debridement and removal of this tissue as well as took cultures and irrigated.  Following this,  I then turned attention toward irrigation once again and additionally irrigated some of the region both about the volar, radial and the lateral ulna wound.  At this time, we took x-rays and evaluated debridement.  The debridement looked very good and I was quite pleased with this.  I then set out a course to try to stabilize the wrist in __________ fashion after the spanning plate versus external.  I think that would be the best option for the patient.  Given this  __________ medical issues and __________ need for a total  hip replacement secondary to failure of his hip surgery, I felt that a __________.  Thus, I made an incision about the index metacarpal and an incision over the radius where the radius had semblance of good tissue.  I then very carefully dissected and performed a superficial radial nerve neurolysis and decompression of the EPL as well as ECR __________ tendons.  This was a radical tenosynovectomy and release of the extensor apparatus and superficial radial nerve neurolysis.  I made a 2-3 inch incision distally and 2-3 inch proximally and __________.  I very carefully decompressed the EPL as well as the __________ ECR and ECRB and __________ muscles.  I left a large bridge of skin so that would minimize my dorsal dissection __________ soft tissue vascular attachments intact about the bony regions which were thoroughly debrided previously.  Once this was done very carefully __________ placed a extended Biomet spanning plate.  We used combination of 3.5 and 2.7 locking screws on the Biomet plate.  I checked this under fluoroscopy and was pleased with the alignment.  I was able to shorten the construct slightly to my satisfaction and this did not cause any problems given the fact that I performed a previous __________ resection.  Once this was complete, the plate was placed to my satisfaction and following this, a final copy of x-rays were taken. I then closed both dorsal  incisions for the plate.  I should note that prior to placing the spanning wrist plate in the patient's hand, sterile drapes placed and gloves and sterile apparatus resecured.  Following this, I then closed the ulnar incision after additional irrigation of approximately 2.5 L.  Following this, I irrigated the volar and radial nerve again and __________ landmarks and visual inspection revealed that the patient had exposed scaphoid as well as lunate __________ apparatus.  The distal articular surface was relatively __________.  At this juncture, I mixed antibiotic impregnated cement on the back table and placed 3 small approximately 1-2 cm cement balls in the area of defect.  My goal will be to remove these cement __________ apparatus later and fill this with bone graft __________ union.  Following this, I then closed the additional volar and radial incision with combination of Prolene suture of 3-0 and 4-0 variety.  Wounds were then dressed with Adaptic, Xeroform, Kerlix, Webril, and __________ splint double __________ elbow was placed.  __________.  All sponge, needle, and instrument counts were reported as correct.  He will be having revision hip reconstruction performed by Dr. Charlann Boxer in the next few days.  We can __________ combining this __________ felt that he would be better served to return to the operative theater in the days forward.  I placed him on antibiotics and obtained an infectious disease consult and I have discussed with his __________ partner all issues and the plans.  It was an absolute pleasure to see him today.  This is very devastating failure of hardware.  I cultured not only the radius but the ulnar incision and deep structures including bone, soft tissue, fluid, and associated structures.  I sent out __________ cultures.  I irrigated with greater than 15-18 L and was very aggressive with my debridement.  I felt really good about the day that we __________  for him to hopefully be later bone grafted if his cultures were stable.  I will __________.  __________.  I discussed with he and __________ in the recovery room our findings, our plans and they all understand and desired  to proceed accordingly with plan of care __________.     Joseph Ano. [name], M.D.     [facility]  D:  10/02/2012  T:  10/03/2012  Job:  [id number]

## 2012-10-04 LAB — CBC
HCT: 30.8 % — ABNORMAL LOW (ref 39.0–52.0)
MCV: 83.9 fL (ref 78.0–100.0)
RBC: 3.67 MIL/uL — ABNORMAL LOW (ref 4.22–5.81)
RDW: 18.2 % — ABNORMAL HIGH (ref 11.5–15.5)
WBC: 7.5 10*3/uL (ref 4.0–10.5)

## 2012-10-04 LAB — BASIC METABOLIC PANEL
CO2: 27 mEq/L (ref 19–32)
Chloride: 97 mEq/L (ref 96–112)
Creatinine, Ser: 0.92 mg/dL (ref 0.50–1.35)
GFR calc Af Amer: >90 mL/min (ref >90)
Sodium: 133 mEq/L — ABNORMAL LOW (ref 135–145)

## 2012-10-04 MED ORDER — SODIUM CHLORIDE 0.9 % IJ SOLN
10.0000 mL | INTRAMUSCULAR | Status: DC | PRN
  Administered 2012-10-05 – 2012-10-08 (×3): 10 mL

## 2012-10-04 MED ORDER — VANCOMYCIN HCL 10 G IV SOLR
1250.0000 mg | Freq: Two times a day (BID) | INTRAVENOUS | Status: DC
  Administered 2012-10-04 – 2012-10-06 (×4): 1250 mg via INTRAVENOUS
  Filled 2012-10-04 (×5): qty 1250

## 2012-10-04 NOTE — Progress Notes (Signed)
   Subjective: 2 Days Post-Op Procedure(s) (LRB): OPEN REDUCTION INTERNAL FIXATION (ORIF) WRIST FRACTURE (Right)   Patient reports pain as mild, pain well controlled. No events throughout the night. Feels that his right wrist is doing well, and understand his restrictions. States that he already saw Dr. Amanda Pea this AM. We discussed going to surgery tomorrow for his right hip. He understands and wishes to proceed.   Objective:   VITALS:   Filed Vitals:   10/04/12 0651  BP: 120/68  Pulse: 72  Temp: 97.7 F (36.5 C)  Resp: 18    Neurovascular intact No cellulitis present Compartment soft  LABS  Basename 10/04/12 0425 10/03/12 0438 10/02/12 0525  HGB 10.0* 10.1* 10.6*  HCT 30.8* 31.1* 33.2*  WBC 7.5 9.7 7.5  PLT 255 261 297     Basename 10/04/12 0425 10/03/12 0438 10/02/12 0525  NA 133* 128* 132*  K 4.2 4.6 4.2  BUN 15 17 20   CREATININE 0.92 0.84 0.91  GLUCOSE 127* 151* 127*     Assessment/Plan: 2 Days Post-Op Procedure(s) (LRB): OPEN REDUCTION INTERNAL FIXATION (ORIF) WRIST FRACTURE (Right)   NPO after midnight tonight. Plan is for surgery tomorrow, patient understand and wishes to proceed     Anastasio Auerbach. Beautifull Cisar   PAC  10/04/2012, 10:01 AM

## 2012-10-04 NOTE — Progress Notes (Signed)
ANTIBIOTIC CONSULT NOTE - f/u  Pharmacy Consult for: vancomycin Indication: r/o osteomyelitis R wrist  No Known Allergies  Patient Measurements: Height: 6\' 4"  (193 cm) Weight: 320 lb (145.151 kg) IBW/kg (Calculated) : 86.8    Vital Signs: Temp: 97.7 F (36.5 C) (12/25 0651) Temp src: Oral (12/25 0651) BP: 120/68 mmHg (12/25 0651) Pulse Rate: 72  (12/25 0651) Intake/Output from previous day: 12/24 0701 - 12/25 0700 In: 4685 [P.O.:1200; I.V.:2435; IV Piggyback:1050] Out: 4330 [Urine:4325; Drains:5] Intake/Output from this shift: Total I/O In: 240 [P.O.:240] Out: -   Labs:  Basename 10/04/12 0425 10/03/12 0438 10/02/12 0525  WBC 7.5 9.7 7.5  HGB 10.0* 10.1* 10.6*  PLT 255 261 297  LABCREA -- -- --  CREATININE 0.92 0.84 0.91   Estimated Creatinine Clearance: 121.4 ml/min (by C-G formula based on Cr of 0.92).  Normalized Creatinine Clearance: ~ 79 mL/min/ 72kg   Microbiology: Recent Results (from the past 720 hour(s))  URINE CULTURE     Status: Normal   Collection Time   09/29/12 12:21 AM      Component Value Range Status Comment   Specimen Description URINE, CLEAN CATCH   Final    Special Requests NONE   Final    Culture  Setup Time 09/29/2012 01:25   Final    Colony Count >=100,000 COLONIES/ML   Final    Culture ESCHERICHIA COLI   Final    Report Status 09/30/2012 FINAL   Final    Organism ID, Bacteria ESCHERICHIA COLI   Final   SURGICAL PCR SCREEN     Status: Normal   Collection Time   09/30/12  7:58 PM      Component Value Range Status Comment   MRSA, PCR NEGATIVE  NEGATIVE Final    Staphylococcus aureus NEGATIVE  NEGATIVE Final   MRSA PCR SCREENING     Status: Normal   Collection Time   10/02/12  3:25 PM      Component Value Range Status Comment   MRSA by PCR NEGATIVE  NEGATIVE Final   BODY FLUID CULTURE     Status: Normal (Preliminary result)   Collection Time   10/02/12  6:29 PM      Component Value Range Status Comment   Specimen Description  FLUID RIGHT WRIST 1   Final    Special Requests NONE   Final    Gram Stain     Final    Value: RARE WBC PRESENT, PREDOMINANTLY MONONUCLEAR     NO ORGANISMS SEEN   Culture     Final    Value: FEW GRAM NEGATIVE RODS     Note: CRITICAL RESULT CALLED TO, READ BACK BY AND VERIFIED WITH: DANNA FRANKLIN@0923  ON 122513 BY [facility]   Report Status PENDING   Incomplete   ANAEROBIC CULTURE     Status: Normal (Preliminary result)   Collection Time   10/02/12  6:29 PM      Component Value Range Status Comment   Specimen Description FLUID RIGHT WRIST 1   Final    Special Requests NONE   Final    Gram Stain     Final    Value: RARE WBC PRESENT, PREDOMINANTLY MONONUCLEAR     NO ORGANISMS SEEN   Culture     Final    Value: NO ANAEROBES ISOLATED; CULTURE IN PROGRESS FOR 5 DAYS   Report Status PENDING   Incomplete   TISSUE CULTURE     Status: Normal (Preliminary result)   Collection Time  10/02/12  6:31 PM      Component Value Range Status Comment   Specimen Description WRIST RIGHT 1   Final    Special Requests NONE   Final    Gram Stain     Final    Value: NO WBC SEEN     NO ORGANISMS SEEN   Culture FEW GRAM NEGATIVE RODS   Final    Report Status PENDING   Incomplete   ANAEROBIC CULTURE     Status: Normal (Preliminary result)   Collection Time   10/02/12  6:31 PM      Component Value Range Status Comment   Specimen Description WRIST RIGHT 1   Final    Special Requests NONE   Final    Gram Stain     Final    Value: NO WBC SEEN     NO ORGANISMS SEEN   Culture     Final    Value: NO ANAEROBES ISOLATED; CULTURE IN PROGRESS FOR 5 DAYS   Report Status PENDING   Incomplete   AFB CULTURE WITH SMEAR     Status: Normal (Preliminary result)   Collection Time   10/02/12  6:31 PM      Component Value Range Status Comment   Specimen Description WRIST RIGHT 1   Final    Special Requests NONE   Final    ACID FAST SMEAR NO ACID FAST BACILLI SEEN   Final    Culture     Final    Value: CULTURE WILL BE  EXAMINED FOR 6 WEEKS BEFORE ISSUING A FINAL REPORT   Report Status PENDING   Incomplete   FUNGUS CULTURE W SMEAR     Status: Normal (Preliminary result)   Collection Time   10/02/12  6:31 PM      Component Value Range Status Comment   Specimen Description WRIST RIGHT 1   Final    Special Requests NONE   Final    Fungal Smear NO YEAST OR FUNGAL ELEMENTS SEEN   Final    Culture CULTURE IN PROGRESS FOR FOUR WEEKS   Final    Report Status PENDING   Incomplete   BODY FLUID CULTURE     Status: Normal (Preliminary result)   Collection Time   10/02/12  6:48 PM      Component Value Range Status Comment   Specimen Description FLUID RIGHT WRIST 2   Final    Special Requests NONE   Final    Gram Stain     Final    Value: RARE WBC PRESENT,BOTH PMN AND MONONUCLEAR     NO ORGANISMS SEEN   Culture     Final    Value: FEW GRAM NEGATIVE RODS     Note: CRITICAL RESULT CALLED TO, READ BACK BY AND VERIFIED WITH: DANNA FRANKLIN@0923  ON 122513 BY [facility]   Report Status PENDING   Incomplete   ANAEROBIC CULTURE     Status: Normal (Preliminary result)   Collection Time   10/02/12  6:48 PM      Component Value Range Status Comment   Specimen Description FLUID RIGHT WRIST 2   Final    Special Requests NONE   Final    Gram Stain     Final    Value: RARE WBC PRESENT,BOTH PMN AND MONONUCLEAR     NO ORGANISMS SEEN   Culture     Final    Value: NO ANAEROBES ISOLATED; CULTURE IN PROGRESS FOR 5 DAYS   Report Status PENDING  Incomplete   TISSUE CULTURE     Status: Normal (Preliminary result)   Collection Time   10/02/12  6:51 PM      Component Value Range Status Comment   Specimen Description WRIST RIGHT 2   Final    Special Requests NONE   Final    Gram Stain     Final    Value: NO WBC SEEN     NO ORGANISMS SEEN   Culture NO GROWTH 1 DAY   Final    Report Status PENDING   Incomplete   ANAEROBIC CULTURE     Status: Normal (Preliminary result)   Collection Time   10/02/12  6:51 PM      Component Value  Range Status Comment   Specimen Description WRIST RIGHT 2   Final    Special Requests NONE   Final    Gram Stain     Final    Value: NO WBC SEEN     NO ORGANISMS SEEN   Culture     Final    Value: NO ANAEROBES ISOLATED; CULTURE IN PROGRESS FOR 5 DAYS   Report Status PENDING   Incomplete   AFB CULTURE WITH SMEAR     Status: Normal (Preliminary result)   Collection Time   10/02/12  6:51 PM      Component Value Range Status Comment   Specimen Description WRIST RIGHT 2   Final    Special Requests NONE   Final    ACID FAST SMEAR NO ACID FAST BACILLI SEEN   Final    Culture     Final    Value: CULTURE WILL BE EXAMINED FOR 6 WEEKS BEFORE ISSUING A FINAL REPORT   Report Status PENDING   Incomplete   BODY FLUID CULTURE     Status: Normal (Preliminary result)   Collection Time   10/02/12  7:34 PM      Component Value Range Status Comment   Specimen Description FLUID RIGHT WRIST 3   Final    Special Requests NONE   Final    Gram Stain     Final    Value: RARE WBC PRESENT,BOTH PMN AND MONONUCLEAR     NO ORGANISMS SEEN   Culture     Final    Value: GRAM NEGATIVE RODS     Note: CRITICAL RESULT CALLED TO, READ BACK BY AND VERIFIED WITH: DANNA FRANKLIN@0923  ON 122513 BY [facility]   Report Status PENDING   Incomplete   ANAEROBIC CULTURE     Status: Normal (Preliminary result)   Collection Time   10/02/12  7:34 PM      Component Value Range Status Comment   Specimen Description FLUID RIGHT WRIST 3   Final    Special Requests NONE   Final    Gram Stain     Final    Value: RARE WBC PRESENT,BOTH PMN AND MONONUCLEAR     NO ORGANISMS SEEN   Culture     Final    Value: NO ANAEROBES ISOLATED; CULTURE IN PROGRESS FOR 5 DAYS   Report Status PENDING   Incomplete     Medications:  Anti-infectives     Start     Dose/Rate Route Frequency Ordered Stop   10/04/12 0600   vancomycin (VANCOCIN) 1,500 mg in sodium chloride 0.9 % 500 mL IVPB        1,500 mg 250 mL/hr over 120 Minutes Intravenous Every 12  hours 10/03/12 1451     10/03/12 1600  vancomycin (VANCOCIN) 2,500 mg in sodium chloride 0.9 % 500 mL IVPB        2,500 mg 250 mL/hr over 120 Minutes Intravenous  Once 10/03/12 1451 10/03/12 1849   10/03/12 1500   ceFEPIme (MAXIPIME) 2 g in dextrose 5 % 50 mL IVPB        2 g 100 mL/hr over 30 Minutes Intravenous Every 12 hours 10/03/12 1408     10/03/12 0200   ceFAZolin (ANCEF) IVPB 2 g/50 mL premix  Status:  Discontinued        2 g 100 mL/hr over 30 Minutes Intravenous Every 8 hours 10/02/12 2216 10/03/12 1408   10/02/12 2030   tobramycin (NEBCIN) powder  Status:  Discontinued          As needed 10/02/12 2217 10/02/12 2335   10/02/12 2030   vancomycin (VANCOCIN) powder  Status:  Discontinued          As needed 10/02/12 2218 10/02/12 2335   09/29/12 2200   doxycycline (VIBRA-TABS) tablet 100 mg        100 mg Oral Daily at bedtime 09/29/12 0311     09/29/12 1000   cefTRIAXone (ROCEPHIN) 1 g in dextrose 5 % 50 mL IVPB  Status:  Discontinued        1 g 100 mL/hr over 30 Minutes Intravenous Every 24 hours 09/29/12 0854 10/01/12 1053          Assessment:  66 y/o M with suspected osteomyelitis at site of R wrist fracture and surgery.  Currently on D#2 empiric vancomycin (pharmacy dosing) and cefepime (MD dosing).  Remains on doxycycline 100mg  QHS from PTA for chronic gingivits.  Wrist cultures with GNR - follow.  SCr increased slightly after initial 2 vanc doses.  Will empirically reduce dose.  Goal of Therapy:  Vancomycin trough 15-20 Eradication of infection  Plan:   Decrease to Vancomycin 1250 mg IV q12h  Check trough at steady-state.  Follow serum creatinine, cultures, clinical course.  Lynann Beaver PharmD, BCPS Pager [phone] 10/04/2012 10:35 AM

## 2012-10-04 NOTE — Progress Notes (Signed)
[facility] PROGRESS NOTE  [name] ZOX:[id number] DOB: 04-21-1946 DOA: 09/28/2012 PCP: Ebbie Ridge, MD PCP - Dr. Thomasena Edis in [place]. Orthopedics--Dr. Amanda Pea  Assessment/Plan: Right femoral fracture s/p mechanical fall Per orthopedics, surgery on 12/26.  Right forearm fracture and wound with hardware failure and nonunion of right radius and ulna.  Patient had removal of hardware, irrigation and debridement on 10/02/2012.  Patient placed on cefazolin per orthopedic service, antibiotics transitioned to vancomycin and cefepime on 10/03/2012 by ID.  Antibiotic management per ID, Dr. [name].  Wound cultures from 10/02/2012 gram-negative rods.  PICC line placed on 10/04/2012.  Will need grafting the defect at a later date.  Patient to remain nonweightbearing on right arm/wrist.  Normocytic anemia Stable.  May have mild blood loss anemia from surgery and hip fracture.  Continue to monitor.    E. Coli UTI Completed Rocephin 12/20-12/22.  Atrial fibrillation Stable, in SR on EKG on admission. Xarelto on hold for surgery. Currently on Lovenox. Continue diltiazem, amiodarone. Can resume Xarelto post-op per orthopedics.  HTN Stable.  Continue diltiazem.  Hypothyroidism TSH mildly high at admission. Continue levothyroxine, defer to primary care physician for making any adjustments.  Obstructive sleep apnea Continue CPAP.  Hyponatremia Etiology unclear.  Likely related to pain.  Continue to monitor.  Appears to be chronic.  Code Status: Full code Family Communication: Patient's significant other present in the room and updated. Disposition Plan: SNF, possibly on 10/06/2012 or 10/07/2012?  Discussed with Dr. Charlann Boxer.  Brief narrative: 66 year old male with history of hypertension, atrial fibrillation, hyperlipidemia, hypothyroidism was brought to the ER patient had a fall at his house and experienced right hip pain. Patient has had a fracture of the same area in August  this year and had undergone surgery. X-rays reveal periprosthetic fracture. Patient states that he slipped and fell did not lose consciousness. Denies any chest pain or shortness of breath. Patient has been admitted for further management. Orthopedics on call was consulted.  Consultants:  Orthopedics Dr. Amanda Pea and Dr. Charlann Boxer  ID, Dr. [name]  PT--SNF  Procedures:  Removal of hardware, irrigation and debridement of the right forearm on 10/02/2012.  Antibiotics:  Rocephin 12/20 >> 12/22  Doxycycline on prior to admission.  Cefazolin 10/03/2012 >> 10/03/2012  Vancomycin 10/03/2012 >>  Cefepime 10/03/2012 >>  HPI/Subjective: No specific complaints.  Right arm feels better.  Objective: Filed Vitals:   10/03/12 1530 10/03/12 1800 10/03/12 2116 10/04/12 0651  BP: 129/56 146/66 122/66 120/68  Pulse: 96 95 85 72  Temp: 97.6 F (36.4 C) 99 F (37.2 C) 98.3 F (36.8 C) 97.7 F (36.5 C)  TempSrc:  Oral Axillary Oral  Resp: 16 18 18 18   Height:      Weight:      SpO2: 95% 96% 93% 98%    Intake/Output Summary (Last 24 hours) at 10/04/12 1231 Last data filed at 10/04/12 0900  Gross per 24 hour  Intake 3202.5 ml  Output   2230 ml  Net  972.5 ml   Filed Weights   09/29/12 0300  Weight: 145.151 kg (320 lb)    Exam: Physical Exam: General: Awake, Oriented, No acute distress. HEENT: EOMI. Neck: Supple CV: S1 and S2 Lungs: Clear to ascultation bilaterally Abdomen: Soft, Nontender, Nondistended, +bowel sounds. Ext: Good pulses. Trace edema.  Right upper extremity in bandages.  Data Reviewed: Basic Metabolic Panel:  Lab 10/04/12 8119 10/03/12 0438 10/02/12 0525 10/01/12 0600 09/29/12 0700  NA 133* 128* 132* 133* 135  K 4.2 4.6 4.2  3.8 3.9  CL 97 94* 95* 95* 99  CO2 27 26 28 27 26   GLUCOSE 127* 151* 127* 134* 125*  BUN 15 17 20 20 19   CREATININE 0.92 0.84 0.91 0.93 1.04  CALCIUM 8.8 8.9 9.3 9.1 8.8  MG -- -- -- -- --  PHOS -- -- -- -- --   Liver Function  Tests:  Lab 09/28/12 2340  AST 11  ALT 12  ALKPHOS 135*  BILITOT 0.3  PROT 6.4  ALBUMIN 2.9*   CBC:  Lab 10/04/12 0425 10/03/12 0438 10/02/12 0525 10/01/12 0600 09/29/12 0700 09/28/12 2340  WBC 7.5 9.7 7.5 6.2 7.2 --  NEUTROABS -- -- -- -- -- 5.9  HGB 10.0* 10.1* 10.6* 10.6* 9.9* --  HCT 30.8* 31.1* 33.2* 33.2* 31.0* --  MCV 83.9 82.5 84.1 83.6 84.0 --  PLT 255 261 297 269 255 --    Recent Results (from the past 240 hour(s))  URINE CULTURE     Status: Normal   Collection Time   09/29/12 12:21 AM      Component Value Range Status Comment   Specimen Description URINE, CLEAN CATCH   Final    Special Requests NONE   Final    Culture  Setup Time 09/29/2012 01:25   Final    Colony Count >=100,000 COLONIES/ML   Final    Culture ESCHERICHIA COLI   Final    Report Status 09/30/2012 FINAL   Final    Organism ID, Bacteria ESCHERICHIA COLI   Final   SURGICAL PCR SCREEN     Status: Normal   Collection Time   09/30/12  7:58 PM      Component Value Range Status Comment   MRSA, PCR NEGATIVE  NEGATIVE Final    Staphylococcus aureus NEGATIVE  NEGATIVE Final   MRSA PCR SCREENING     Status: Normal   Collection Time   10/02/12  3:25 PM      Component Value Range Status Comment   MRSA by PCR NEGATIVE  NEGATIVE Final   BODY FLUID CULTURE     Status: Normal (Preliminary result)   Collection Time   10/02/12  6:29 PM      Component Value Range Status Comment   Specimen Description FLUID RIGHT WRIST 1   Final    Special Requests NONE   Final    Gram Stain     Final    Value: RARE WBC PRESENT, PREDOMINANTLY MONONUCLEAR     NO ORGANISMS SEEN   Culture     Final    Value: FEW GRAM NEGATIVE RODS     Note: CRITICAL RESULT CALLED TO, READ BACK BY AND VERIFIED WITH: DANNA FRANKLIN@0923  ON 122513 BY [facility]   Report Status PENDING   Incomplete   ANAEROBIC CULTURE     Status: Normal (Preliminary result)   Collection Time   10/02/12  6:29 PM      Component Value Range Status Comment   Specimen  Description FLUID RIGHT WRIST 1   Final    Special Requests NONE   Final    Gram Stain     Final    Value: RARE WBC PRESENT, PREDOMINANTLY MONONUCLEAR     NO ORGANISMS SEEN   Culture     Final    Value: NO ANAEROBES ISOLATED; CULTURE IN PROGRESS FOR 5 DAYS   Report Status PENDING   Incomplete   TISSUE CULTURE     Status: Normal (Preliminary result)   Collection Time   10/02/12  6:31 PM      Component Value Range Status Comment   Specimen Description WRIST RIGHT 1   Final    Special Requests NONE   Final    Gram Stain     Final    Value: NO WBC SEEN     NO ORGANISMS SEEN   Culture FEW GRAM NEGATIVE RODS   Final    Report Status PENDING   Incomplete   ANAEROBIC CULTURE     Status: Normal (Preliminary result)   Collection Time   10/02/12  6:31 PM      Component Value Range Status Comment   Specimen Description WRIST RIGHT 1   Final    Special Requests NONE   Final    Gram Stain     Final    Value: NO WBC SEEN     NO ORGANISMS SEEN   Culture     Final    Value: NO ANAEROBES ISOLATED; CULTURE IN PROGRESS FOR 5 DAYS   Report Status PENDING   Incomplete   AFB CULTURE WITH SMEAR     Status: Normal (Preliminary result)   Collection Time   10/02/12  6:31 PM      Component Value Range Status Comment   Specimen Description WRIST RIGHT 1   Final    Special Requests NONE   Final    ACID FAST SMEAR NO ACID FAST BACILLI SEEN   Final    Culture     Final    Value: CULTURE WILL BE EXAMINED FOR 6 WEEKS BEFORE ISSUING A FINAL REPORT   Report Status PENDING   Incomplete   FUNGUS CULTURE W SMEAR     Status: Normal (Preliminary result)   Collection Time   10/02/12  6:31 PM      Component Value Range Status Comment   Specimen Description WRIST RIGHT 1   Final    Special Requests NONE   Final    Fungal Smear NO YEAST OR FUNGAL ELEMENTS SEEN   Final    Culture CULTURE IN PROGRESS FOR FOUR WEEKS   Final    Report Status PENDING   Incomplete   BODY FLUID CULTURE     Status: Normal (Preliminary  result)   Collection Time   10/02/12  6:48 PM      Component Value Range Status Comment   Specimen Description FLUID RIGHT WRIST 2   Final    Special Requests NONE   Final    Gram Stain     Final    Value: RARE WBC PRESENT,BOTH PMN AND MONONUCLEAR     NO ORGANISMS SEEN   Culture     Final    Value: FEW GRAM NEGATIVE RODS     Note: CRITICAL RESULT CALLED TO, READ BACK BY AND VERIFIED WITH: DANNA FRANKLIN@0923  ON 122513 BY [facility]   Report Status PENDING   Incomplete   ANAEROBIC CULTURE     Status: Normal (Preliminary result)   Collection Time   10/02/12  6:48 PM      Component Value Range Status Comment   Specimen Description FLUID RIGHT WRIST 2   Final    Special Requests NONE   Final    Gram Stain     Final    Value: RARE WBC PRESENT,BOTH PMN AND MONONUCLEAR     NO ORGANISMS SEEN   Culture     Final    Value: NO ANAEROBES ISOLATED; CULTURE IN PROGRESS FOR 5 DAYS   Report Status PENDING  Incomplete   TISSUE CULTURE     Status: Normal (Preliminary result)   Collection Time   10/02/12  6:51 PM      Component Value Range Status Comment   Specimen Description WRIST RIGHT 2   Final    Special Requests NONE   Final    Gram Stain     Final    Value: NO WBC SEEN     NO ORGANISMS SEEN   Culture NO GROWTH 1 DAY   Final    Report Status PENDING   Incomplete   ANAEROBIC CULTURE     Status: Normal (Preliminary result)   Collection Time   10/02/12  6:51 PM      Component Value Range Status Comment   Specimen Description WRIST RIGHT 2   Final    Special Requests NONE   Final    Gram Stain     Final    Value: NO WBC SEEN     NO ORGANISMS SEEN   Culture     Final    Value: NO ANAEROBES ISOLATED; CULTURE IN PROGRESS FOR 5 DAYS   Report Status PENDING   Incomplete   AFB CULTURE WITH SMEAR     Status: Normal (Preliminary result)   Collection Time   10/02/12  6:51 PM      Component Value Range Status Comment   Specimen Description WRIST RIGHT 2   Final    Special Requests NONE   Final     ACID FAST SMEAR NO ACID FAST BACILLI SEEN   Final    Culture     Final    Value: CULTURE WILL BE EXAMINED FOR 6 WEEKS BEFORE ISSUING A FINAL REPORT   Report Status PENDING   Incomplete   BODY FLUID CULTURE     Status: Normal (Preliminary result)   Collection Time   10/02/12  7:34 PM      Component Value Range Status Comment   Specimen Description FLUID RIGHT WRIST 3   Final    Special Requests NONE   Final    Gram Stain     Final    Value: RARE WBC PRESENT,BOTH PMN AND MONONUCLEAR     NO ORGANISMS SEEN   Culture     Final    Value: GRAM NEGATIVE RODS     Note: CRITICAL RESULT CALLED TO, READ BACK BY AND VERIFIED WITH: DANNA FRANKLIN@0923  ON 122513 BY [facility]   Report Status PENDING   Incomplete   ANAEROBIC CULTURE     Status: Normal (Preliminary result)   Collection Time   10/02/12  7:34 PM      Component Value Range Status Comment   Specimen Description FLUID RIGHT WRIST 3   Final    Special Requests NONE   Final    Gram Stain     Final    Value: RARE WBC PRESENT,BOTH PMN AND MONONUCLEAR     NO ORGANISMS SEEN   Culture     Final    Value: NO ANAEROBES ISOLATED; CULTURE IN PROGRESS FOR 5 DAYS   Report Status PENDING   Incomplete      Studies: No results found.  Scheduled Meds:    . amiodarone  100 mg Oral Daily  . atorvastatin  80 mg Oral Daily  . ceFEPime (MAXIPIME) IV  2 g Intravenous Q12H  . cholecalciferol  2,000 Units Oral Daily  . diltiazem  180 mg Oral Daily  . docusate sodium  100 mg Oral BID  . doxycycline  100 mg Oral QHS  . DULoxetine  60 mg Oral Daily  . levothyroxine  200 mcg Oral QAC breakfast  . multivitamin with minerals  1 tablet Oral Daily  . pantoprazole  40 mg Oral Daily  . polyethylene glycol  17 g Oral Daily  . ranolazine  500 mg Oral BID  . sodium chloride  3 mL Intravenous Q12H  . Tamsulosin HCl  0.4 mg Oral Daily  . vancomycin  1,250 mg Intravenous Q12H   Continuous Infusions:    . sodium chloride 75 mL/hr at 10/04/12 1155     Principal Problem:  *Right femoral fracture Active Problems:  Right forearm fracture  Atrial fibrillation  Hypothyroidism  History of recurrent UTIs  Normocytic anemia  Low testosterone, possible hypogonadism  HTN (hypertension)  Psoriasis  Dyslipidemia  Vitamin D deficiency  GERD (gastroesophageal reflux disease)  Hyperglycemia  Anxiety  Depression  CAD (coronary artery disease)  DJD (degenerative joint disease)  Chronic gingivitis    Lil Lepage A, MD  Pager [phone] If 8PM-8AM, please contact night-coverage at [email or web address], password [facility] 10/04/2012, 12:31 PM  LOS: 6 days   Time spent: 20 minutes

## 2012-10-04 NOTE — Progress Notes (Signed)
0930  From [facility] labs, gram negative rods from right wrist culture, Bartholomew Crews PA made aware.  Sharrell Ku RN

## 2012-10-04 NOTE — Progress Notes (Signed)
Subjective: 2 Days Post-Op Procedure(s) (LRB): OPEN REDUCTION INTERNAL FIXATION (ORIF) WRIST FRACTURE (Right) Patient reports pain as mild/moderate He is doing well and tolerating his diet Denies fever chills nausea ID has seen and made ABX recs  Objective: Vital signs in last 24 hours: Temp:  [97.6 F (36.4 C)-99 F (37.2 C)] 97.7 F (36.5 C) (12/25 0651) Pulse Rate:  [72-96] 72  (12/25 0651) Resp:  [16-18] 18  (12/25 0651) BP: (120-146)/(56-68) 120/68 mmHg (12/25 0651) SpO2:  [93 %-98 %] 98 % (12/25 0651)  Intake/Output from previous day: 12/24 0701 - 12/25 0700 In: 4685 [P.O.:1200; I.V.:2435; IV Piggyback:1050] Out: 4330 [Urine:4325; Drains:5] Intake/Output this shift: Total I/O In: 1020 [P.O.:720; I.V.:300] Out: -    Basename 10/04/12 0425 10/03/12 0438 10/02/12 0525  HGB 10.0* 10.1* 10.6*    Basename 10/04/12 0425 10/03/12 0438  WBC 7.5 9.7  RBC 3.67* 3.77*  HCT 30.8* 31.1*  PLT 255 261    Basename 10/04/12 0425 10/03/12 0438  NA 133* 128*  K 4.2 4.6  CL 97 94*  CO2 27 26  BUN 15 17  CREATININE 0.92 0.84  GLUCOSE 127* 151*  CALCIUM 8.8 8.9   No results found for this basename: LABPT:2,INR:2 in the last 72 hours  Physical exam:  Right hand has excelleny refill and sensation.  Moves the fingers well through a short arc and he is nontender with motion Left arm is NVI without complaints Chest is clear HEENT is WNL BLE are stable with the right hip awaiting revision surgery per Dr. Charlann Boxer  Assessment/Plan: 2 Days Post-Op Procedure(s) (LRB): OPEN REDUCTION INTERNAL FIXATION (ORIF) WRIST FRACTURE (Right) Revision in nature with Abx spacer placement and removal of failed plate and screw fixation of the radius and ulna August 2013 Plan for continued IV ABX Our plan is to await culture resuls, continue ABX and monitor the wounds for successful healing. Would plan for grafting the defect at a later date once soft tissue and wound conditions are favorable.   Patient is aware of the plans and understands that he should remain non-weight bearing on the right arm/wrist Overall he is doing well givin the compexity of his problems   Karen Chafe 10/04/2012, 2:32 PM

## 2012-10-04 NOTE — Progress Notes (Signed)
Peripherally Inserted Central Catheter/Midline Placement  The IV Nurse has discussed with the patient and/or persons authorized to consent for the patient, the purpose of this procedure and the potential benefits and risks involved with this procedure.  The benefits include less needle sticks, lab draws from the catheter and patient may be discharged home with the catheter.  Risks include, but not limited to, infection, bleeding, blood clot (thrombus formation), and puncture of an artery; nerve damage and irregular heat beat.  Alternatives to this procedure were also discussed.  PICC/Midline Placement Documentation        [name] 10/04/2012, 11:17 AM

## 2012-10-04 NOTE — Progress Notes (Signed)
Unable to obtain PIV access. Pt has edema left arm, cast on right arm and is for surgery tomorrow- per pt. Pt also receiving Vanc. Spoke with PA re: possibility for PICC insertion this am.

## 2012-10-05 ENCOUNTER — Inpatient Hospital Stay (HOSPITAL_COMMUNITY): Payer: Medicare Other

## 2012-10-05 ENCOUNTER — Inpatient Hospital Stay (HOSPITAL_COMMUNITY): Payer: Medicare Other | Admitting: Anesthesiology

## 2012-10-05 ENCOUNTER — Encounter (HOSPITAL_COMMUNITY)
Admission: EM | Disposition: A | Discharge status: Skilled Nursing Facility | Payer: Self-pay | Source: Home / Self Care | Attending: Internal Medicine

## 2012-10-05 ENCOUNTER — Encounter (HOSPITAL_COMMUNITY): Payer: Self-pay | Admitting: Anesthesiology

## 2012-10-05 DIAGNOSIS — I10 Essential (primary) hypertension: Secondary | ICD-10-CM

## 2012-10-05 DIAGNOSIS — I2510 Atherosclerotic heart disease of native coronary artery without angina pectoris: Secondary | ICD-10-CM

## 2012-10-05 HISTORY — PX: TOTAL HIP REVISION: SHX763

## 2012-10-05 HISTORY — PX: HARDWARE REMOVAL: SHX979

## 2012-10-05 LAB — CBC
HCT: 27.6 % — ABNORMAL LOW (ref 39.0–52.0)
MCV: 84.4 fL (ref 78.0–100.0)
Platelets: 211 10*3/uL (ref 150–400)
RBC: 3.27 MIL/uL — ABNORMAL LOW (ref 4.22–5.81)
RDW: 18.1 % — ABNORMAL HIGH (ref 11.5–15.5)
WBC: 6.7 10*3/uL (ref 4.0–10.5)

## 2012-10-05 LAB — BODY FLUID CULTURE

## 2012-10-05 LAB — PREPARE RBC (CROSSMATCH)

## 2012-10-05 LAB — BASIC METABOLIC PANEL
BUN: 19 mg/dL (ref 6–23)
CO2: 29 mEq/L (ref 19–32)
Chloride: 96 mEq/L (ref 96–112)
GFR calc Af Amer: >90 mL/min (ref >90)
Potassium: 4.2 mEq/L (ref 3.5–5.1)

## 2012-10-05 SURGERY — TOTAL HIP REVISION
Anesthesia: General | Site: Hip | Laterality: Right | Wound class: Clean

## 2012-10-05 MED ORDER — SUCCINYLCHOLINE CHLORIDE 20 MG/ML IJ SOLN
INTRAMUSCULAR | Status: DC | PRN
  Administered 2012-10-05: 100 mg via INTRAVENOUS

## 2012-10-05 MED ORDER — FENTANYL CITRATE 0.05 MG/ML IJ SOLN
INTRAMUSCULAR | Status: DC | PRN
  Administered 2012-10-05: 50 ug via INTRAVENOUS
  Administered 2012-10-05: 25 ug via INTRAVENOUS
  Administered 2012-10-05: 50 ug via INTRAVENOUS
  Administered 2012-10-05 (×5): 25 ug via INTRAVENOUS
  Administered 2012-10-05: 100 ug via INTRAVENOUS
  Administered 2012-10-05 (×4): 25 ug via INTRAVENOUS
  Administered 2012-10-05: 50 ug via INTRAVENOUS

## 2012-10-05 MED ORDER — ONDANSETRON HCL 4 MG/2ML IJ SOLN
INTRAMUSCULAR | Status: DC | PRN
  Administered 2012-10-05: 4 mg via INTRAVENOUS

## 2012-10-05 MED ORDER — SODIUM CHLORIDE 0.9 % IV SOLN
INTRAVENOUS | Status: DC | PRN
  Administered 2012-10-05: 20:00:00 via INTRAVENOUS

## 2012-10-05 MED ORDER — CALCIUM CHLORIDE 10 % IV SOLN
1.0000 g | Freq: Once | INTRAVENOUS | Status: DC
  Filled 2012-10-05: qty 10

## 2012-10-05 MED ORDER — ROCURONIUM BROMIDE 100 MG/10ML IV SOLN
INTRAVENOUS | Status: DC | PRN
  Administered 2012-10-05: 10 mg via INTRAVENOUS
  Administered 2012-10-05: 20 mg via INTRAVENOUS

## 2012-10-05 MED ORDER — DEXAMETHASONE SODIUM PHOSPHATE 10 MG/ML IJ SOLN
INTRAMUSCULAR | Status: DC | PRN
  Administered 2012-10-05: 10 mg via INTRAVENOUS

## 2012-10-05 MED ORDER — MIDAZOLAM HCL 5 MG/5ML IJ SOLN
INTRAMUSCULAR | Status: DC | PRN
  Administered 2012-10-05: 2 mg via INTRAVENOUS

## 2012-10-05 MED ORDER — HETASTARCH-ELECTROLYTES 6 % IV SOLN
INTRAVENOUS | Status: DC | PRN
  Administered 2012-10-05: 20:00:00 via INTRAVENOUS

## 2012-10-05 MED ORDER — LACTATED RINGERS IV SOLN
INTRAVENOUS | Status: DC | PRN
  Administered 2012-10-05 (×2): via INTRAVENOUS

## 2012-10-05 MED ORDER — PROMETHAZINE HCL 25 MG/ML IJ SOLN: 6.2500 mg | INTRAMUSCULAR | Status: DC | PRN

## 2012-10-05 MED ORDER — CALCIUM CHLORIDE 10 % IV SOLN
INTRAVENOUS | Status: DC | PRN
  Administered 2012-10-05 (×2): 1 g via INTRAVENOUS

## 2012-10-05 MED ORDER — PROPOFOL 10 MG/ML IV BOLUS
INTRAVENOUS | Status: DC | PRN
  Administered 2012-10-05: 150 mg via INTRAVENOUS

## 2012-10-05 MED ORDER — HYDROMORPHONE HCL PF 1 MG/ML IJ SOLN
0.2500 mg | INTRAMUSCULAR | Status: DC | PRN
  Administered 2012-10-05: 0.5 mg via INTRAVENOUS

## 2012-10-05 MED ORDER — LACTATED RINGERS IV SOLN
INTRAVENOUS | Status: DC | PRN
  Administered 2012-10-05 (×2): via INTRAVENOUS

## 2012-10-05 MED ORDER — LACTATED RINGERS IV SOLN
INTRAVENOUS | Status: DC
  Administered 2012-10-05: 1000 mL via INTRAVENOUS

## 2012-10-05 MED ORDER — 0.9 % SODIUM CHLORIDE (POUR BTL) OPTIME
TOPICAL | Status: DC | PRN
  Administered 2012-10-05: 1000 mL

## 2012-10-05 MED ORDER — FUROSEMIDE 10 MG/ML IJ SOLN
20.0000 mg | Freq: Once | INTRAMUSCULAR | Status: AC
  Administered 2012-10-05: 20 mg via INTRAVENOUS
  Filled 2012-10-05: qty 2

## 2012-10-05 SURGICAL SUPPLY — 89 items
ADH SKN CLS APL DERMABOND .7 (GAUZE/BANDAGES/DRESSINGS)
BAG SPEC THK2 15X12 ZIP CLS (MISCELLANEOUS) ×3
BAG ZIPLOCK 12X15 (MISCELLANEOUS) ×4 IMPLANT
BANDAGE ELASTIC 6 VELCRO ST LF (GAUZE/BANDAGES/DRESSINGS) ×1 IMPLANT
BANDAGE ESMARK 6X9 LF (GAUZE/BANDAGES/DRESSINGS) ×1 IMPLANT
BLADE SAW SGTL 18X1.27X75 (BLADE) ×2 IMPLANT
BNDG CMPR 9X6 STRL LF SNTH (GAUZE/BANDAGES/DRESSINGS) ×1
BNDG ESMARK 6X9 LF (GAUZE/BANDAGES/DRESSINGS) ×2
BRUSH FEMORAL CANAL (MISCELLANEOUS) IMPLANT
CABLE (Orthopedic Implant) ×5 IMPLANT
CLOTH BEACON ORANGE TIMEOUT ST (SAFETY) ×2 IMPLANT
COVER MAYO STAND STRL (DRAPES) IMPLANT
CUFF TOURN SGL QUICK 34 (TOURNIQUET CUFF)
CUFF TRNQT CYL 34X4X40X1 (TOURNIQUET CUFF) ×1 IMPLANT
CUP ACET PINNACLE SECTR 60MM (Hips) IMPLANT
DERMABOND ADVANCED (GAUZE/BANDAGES/DRESSINGS)
DERMABOND ADVANCED .7 DNX12 (GAUZE/BANDAGES/DRESSINGS) ×1 IMPLANT
DRAPE C-ARM 42X72 X-RAY (DRAPES) IMPLANT
DRAPE EXTREMITY T 121X128X90 (DRAPE) IMPLANT
DRAPE INCISE IOBAN 66X45 STRL (DRAPES) ×2 IMPLANT
DRAPE INCISE IOBAN 85X60 (DRAPES) ×2 IMPLANT
DRAPE LG THREE QUARTER DISP (DRAPES) ×3 IMPLANT
DRAPE OEC MINIVIEW 54X84 (DRAPES) IMPLANT
DRAPE ORTHO SPLIT 77X108 STRL (DRAPES) ×4
DRAPE POUCH INSTRU U-SHP 10X18 (DRAPES) ×2 IMPLANT
DRAPE STERI IOBAN 125X83 (DRAPES) ×2 IMPLANT
DRAPE SURG 17X11 SM STRL (DRAPES) ×5 IMPLANT
DRAPE SURG ORHT 6 SPLT 77X108 (DRAPES) ×2 IMPLANT
DRAPE U-SHAPE 47X51 STRL (DRAPES) ×1 IMPLANT
DRSG AQUACEL AG ADV 3.5X10 (GAUZE/BANDAGES/DRESSINGS) IMPLANT
DRSG AQUACEL AG ADV 3.5X14 (GAUZE/BANDAGES/DRESSINGS) ×1 IMPLANT
DRSG EMULSION OIL 3X16 NADH (GAUZE/BANDAGES/DRESSINGS) ×2 IMPLANT
DRSG MEPILEX BORDER 4X4 (GAUZE/BANDAGES/DRESSINGS) ×1 IMPLANT
DRSG MEPILEX BORDER 4X8 (GAUZE/BANDAGES/DRESSINGS) ×1 IMPLANT
DRSG PAD ABDOMINAL 8X10 ST (GAUZE/BANDAGES/DRESSINGS) ×1 IMPLANT
DRSG TEGADERM 4X4.75 (GAUZE/BANDAGES/DRESSINGS) ×2 IMPLANT
DURAPREP 26ML APPLICATOR (WOUND CARE) ×3 IMPLANT
ELECT BLADE TIP CTD 4 INCH (ELECTRODE) ×2 IMPLANT
ELECT REM PT RETURN 9FT ADLT (ELECTROSURGICAL) ×2
ELECTRODE REM PT RTRN 9FT ADLT (ELECTROSURGICAL) ×1 IMPLANT
ELIMINATOR HOLE APEX DEPUY (Hips) ×1 IMPLANT
EVACUATOR 1/8 PVC DRAIN (DRAIN) ×2 IMPLANT
FACESHIELD LNG OPTICON STERILE (SAFETY) ×8 IMPLANT
GAUZE SPONGE 2X2 8PLY STRL LF (GAUZE/BANDAGES/DRESSINGS) ×1 IMPLANT
GAUZE XEROFORM 5X9 LF (GAUZE/BANDAGES/DRESSINGS) ×1 IMPLANT
GLOVE BIOGEL PI IND STRL 7.5 (GLOVE) ×1 IMPLANT
GLOVE BIOGEL PI IND STRL 8 (GLOVE) ×1 IMPLANT
GLOVE BIOGEL PI INDICATOR 7.5 (GLOVE) ×1
GLOVE BIOGEL PI INDICATOR 8 (GLOVE) ×1
GLOVE ECLIPSE 8.0 STRL XLNG CF (GLOVE) ×4 IMPLANT
GLOVE ORTHO TXT STRL SZ7.5 (GLOVE) ×5 IMPLANT
GLOVE SURG ORTHO 8.0 STRL STRW (GLOVE) ×2 IMPLANT
GOWN BRE IMP PREV XXLGXLNG (GOWN DISPOSABLE) ×3 IMPLANT
GOWN STRL NON-REIN LRG LVL3 (GOWN DISPOSABLE) ×4 IMPLANT
GRAFT DBM PARTICULATE CANC 10 (Bone Implant) IMPLANT
GRAFT IC CHAMBER MED (Bone Implant) ×2 IMPLANT
HANDPIECE INTERPULSE COAX TIP (DISPOSABLE)
HEAD CERAMIC BIO DELTA 36 (Hips) ×1 IMPLANT
KIT BASIN OR (CUSTOM PROCEDURE TRAY) ×2 IMPLANT
LINER NEUTRAL 58X36MM PLUS4 ×1 IMPLANT
MANIFOLD NEPTUNE II (INSTRUMENTS) ×4 IMPLANT
NS IRRIG 1000ML POUR BTL (IV SOLUTION) ×3 IMPLANT
PACK TOTAL JOINT (CUSTOM PROCEDURE TRAY) ×2 IMPLANT
PADDING CAST COTTON 6X4 STRL (CAST SUPPLIES) ×1 IMPLANT
PINNSECTOR W/GRIP ACE CUP 60MM (Hips) ×2 IMPLANT
POSITIONER SURGICAL ARM (MISCELLANEOUS) ×2 IMPLANT
PRESSURIZER FEMORAL UNIV (MISCELLANEOUS) IMPLANT
SCREW 6.5MMX40MM (Screw) ×1 IMPLANT
SCREW PINNACLE CAN 6.5X40MM (Screw) ×1 IMPLANT
SET HNDPC FAN SPRY TIP SCT (DISPOSABLE) IMPLANT
SPONGE GAUZE 2X2 STER 10/PKG (GAUZE/BANDAGES/DRESSINGS) ×1
SPONGE GAUZE 4X4 12PLY (GAUZE/BANDAGES/DRESSINGS) ×1 IMPLANT
SPONGE LAP 18X18 X RAY DECT (DISPOSABLE) ×5 IMPLANT
SPONGE LAP 4X18 X RAY DECT (DISPOSABLE) ×1 IMPLANT
STAPLER VISISTAT 35W (STAPLE) ×2 IMPLANT
STRIP CLOSURE SKIN 1/2X4 (GAUZE/BANDAGES/DRESSINGS) ×4 IMPLANT
SUCTION FRAZIER TIP 10 FR DISP (SUCTIONS) ×2 IMPLANT
SUT MNCRL AB 4-0 PS2 18 (SUTURE) IMPLANT
SUT VIC AB 1 CT1 36 (SUTURE) ×7 IMPLANT
SUT VIC AB 2-0 CT1 27 (SUTURE) ×6
SUT VIC AB 2-0 CT1 TAPERPNT 27 (SUTURE) ×3 IMPLANT
SUT VLOC 180 0 24IN GS25 (SUTURE) ×4 IMPLANT
SYSTEM SOLUTION 10IN RT 19.5M (Stem) ×1 IMPLANT
TAPE CLOTH SURG 4X10 WHT LF (GAUZE/BANDAGES/DRESSINGS) ×1 IMPLANT
TOWEL OR 17X26 10 PK STRL BLUE (TOWEL DISPOSABLE) ×6 IMPLANT
TOWER CARTRIDGE SMART MIX (DISPOSABLE) IMPLANT
TRAY FOLEY CATH 14FRSI W/METER (CATHETERS) ×1 IMPLANT
WATER STERILE IRR 1500ML POUR (IV SOLUTION) ×2 IMPLANT
femoral stem (Stem) ×1 IMPLANT

## 2012-10-05 NOTE — Anesthesia Preprocedure Evaluation (Addendum)
Anesthesia Evaluation  Patient identified by MRN, date of birth, ID band Patient awake    Reviewed: Allergy & Precautions, H&P , NPO status , Patient's Chart, lab work & pertinent test results  Airway Mallampati: III TM Distance: >3 FB Neck ROM: Full    Dental No notable dental hx. (+) Dental Advisory Given   Pulmonary sleep apnea , former smoker,  breath sounds clear to auscultation  + decreased breath sounds      Cardiovascular hypertension, Pt. on medications + CAD + dysrhythmias Atrial Fibrillation Rhythm:Regular Rate:Normal     Neuro/Psych negative neurological ROS  negative psych ROS   GI/Hepatic negative GI ROS, Neg liver ROS,   Endo/Other  Hypothyroidism Morbid obesity  Renal/GU negative Renal ROS  negative genitourinary   Musculoskeletal negative musculoskeletal ROS (+)   Abdominal   Peds negative pediatric ROS (+)  Hematology  (+) Blood dyscrasia, anemia ,   Anesthesia Other Findings   Reproductive/Obstetrics negative OB ROS                          Anesthesia Physical Anesthesia Plan  ASA: III  Anesthesia Plan: General   Post-op Pain Management:    Induction: Intravenous  Airway Management Planned: Oral ETT  Additional Equipment:   Intra-op Plan:   Post-operative Plan: Extubation in OR  Informed Consent: I have reviewed the patients History and Physical, chart, labs and discussed the procedure including the risks, benefits and alternatives for the proposed anesthesia with the patient or authorized representative who has indicated his/her understanding and acceptance.   Dental advisory given  Plan Discussed with: CRNA and Surgeon  Anesthesia Plan Comments:         Anesthesia Quick Evaluation

## 2012-10-05 NOTE — Progress Notes (Signed)
Pt  Chart and ur reviewed next review due on [id number].

## 2012-10-05 NOTE — Brief Op Note (Signed)
09/28/2012 - 10/05/2012  10:47 PM  PATIENT:  [name]  66 y.o. male  PRE-OPERATIVE DIAGNOSIS:  failed previous right hip surgery, nonunion proximal femur fracture  POST-OPERATIVE DIAGNOSIS:  failed previous right hip surgery, nonunion proximal femur fracture  PROCEDURE:  Procedure(s) (LRB) with comments: TOTAL HIP REVISION (Right) - RIGHT TOTAL HIP REVISION HARDWARE REMOVAL (Right) - REMOVING  STRYKER  GAMMA NAIL  SURGEON:  Surgeon(s) and Role:    * Shelda Pal, MD - Primary  PHYSICIAN ASSISTANT: Lanney Gins, PA-C  ANESTHESIA:   general  EBL:  Total I/O In: 5050 [I.V.:3150; Blood:1400; IV Piggyback:500] Out: 4700 [Urine:1500; Blood:3200]  BLOOD ADMINISTERED:4 units CC PRBC  DRAINS: (1 medium) Hemovact drain(s) in the right hip with  Suction Open   LOCAL MEDICATIONS USED:  NONE  SPECIMEN:  No Specimen  DISPOSITION OF SPECIMEN:  N/A  COUNTS:  YES  TOURNIQUET:  * No tourniquets in log *  DICTATION: .Other Dictation: Dictation Number [id number]  PLAN OF CARE: Admit to inpatient   PATIENT DISPOSITION:  PACU - hemodynamically stable.   Delay start of Pharmacological VTE agent (>24hrs) due to surgical blood loss or risk of bleeding: yes

## 2012-10-05 NOTE — Progress Notes (Signed)
Patient ID: [name], male   DOB: [birth year], 65 y.o.   MRN: [id number]  Failed right hip surgery, planning for OR today for conversion  Ready to have hip corrected  Spoke with Dr. Amanda Pea about post op care  Waiting for final cultures to determine antibiotic use for wrist  NPO Consent redone  Plan to give blood today pre-procedurally due to hgb of 8.7 and expected blood loss at time of surgery

## 2012-10-05 NOTE — Transfer of Care (Signed)
Immediate Anesthesia Transfer of Care Note  Patient: [name]  Procedure(s) Performed: Procedure(s) (LRB) with comments: TOTAL HIP REVISION (Right) - RIGHT TOTAL HIP REVISION HARDWARE REMOVAL (Right) - REMOVING  STRYKER  GAMMA NAIL  Patient Location: PACU  Anesthesia Type:General  Level of Consciousness: awake, alert , oriented and patient cooperative  Airway & Oxygen Therapy: Patient Spontanous Breathing and Patient connected to face mask oxygen  Post-op Assessment: Report given to PACU RN and Post -op Vital signs reviewed and stable  Post vital signs: Reviewed and stable  Complications: No apparent anesthesia complications

## 2012-10-05 NOTE — Progress Notes (Signed)
[facility] PROGRESS NOTE  [name]:[id number] DOB: Sep 10, 1946 DOA: 09/28/2012 PCP: Ebbie Ridge, MD PCP - Dr. Thomasena Edis in [name]. Orthopedics--Dr. Amanda Pea  Assessment/Plan: Right femoral fracture s/p mechanical fall Per orthopedics, surgery on 12/26.  Right forearm fracture and wound with hardware failure and nonunion of right radius and ulna.  Patient had removal of hardware, irrigation and debridement on 10/02/2012.  Currently on vancomycin and cefepime on 10/03/2012 by ID.  Antibiotic management per ID, Dr. [name].  Wound cultures from 10/02/2012 gram-negative rods.  PICC line placed on 10/04/2012.  Will need grafting the defect at a later date.  Patient to remain nonweightbearing on right arm/wrist.  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  Sensitive to cefepime, ceftazidime, ceftriaxone, ciprofloxacin, gentamicin, Zosyn, Bactrim and tobramycin.  Further management as per ID.  Normocytic anemia Stable.  May have mild blood loss anemia from surgery and hip fracture.  Continue to monitor.  Patient to be transfused prior to surgery.  E. Coli UTI Completed Rocephin 12/20-12/22.  Atrial fibrillation Stable, in SR on EKG on admission. Xarelto on hold for surgery. Currently on Lovenox. Continue diltiazem, amiodarone. Can resume Xarelto post-op per orthopedics.  HTN Stable.  Continue diltiazem.  Hypothyroidism TSH mildly high at admission. Continue levothyroxine, defer to primary care physician for making any adjustments.  Obstructive sleep apnea Continue CPAP.  Hyponatremia Etiology unclear.  Likely related to pain.  Continue to monitor.  Appears to be chronic.  Code Status: Full code Family Communication: Patient's significant other present in the room and updated. Disposition Plan: SNF, possibly on 10/09/2012 or 10/07/2012?  Discussed with Dr. Charlann Boxer.  Brief narrative: 66 year old male  with history of hypertension, atrial fibrillation, hyperlipidemia, hypothyroidism was brought to the ER patient had a fall at his house and experienced right hip pain. Patient has had a fracture of the same area in August this year and had undergone surgery. X-rays reveal periprosthetic fracture. Patient states that he slipped and fell did not lose consciousness. Denies any chest pain or shortness of breath. Patient has been admitted for further management. Orthopedics on call was consulted.  Consultants:  Orthopedics Dr. Amanda Pea and Dr. Charlann Boxer  ID, Dr. [name]  PT--SNF  Procedures:  Removal of hardware, irrigation and debridement of the right forearm on 10/02/2012.  Antibiotics:  Rocephin 12/20 >> 12/22  Doxycycline on prior to admission.  Cefazolin 10/03/2012 >> 10/03/2012  Vancomycin 10/03/2012 >>  Cefepime 10/03/2012 >>  HPI/Subjective: No specific concerns.  Resting for surgery.  Right wrist pain improved.  Objective: Filed Vitals:   10/04/12 0651 10/04/12 1509 10/04/12 2218 10/05/12 0507  BP: 120/68 129/77 110/63 156/79  Pulse: 72 70 83 75  Temp: 97.7 F (36.5 C) 97.3 F (36.3 C) 98.4 F (36.9 C) 96.9 F (36.1 C)  TempSrc: Oral  Axillary Axillary  Resp: 18 16 16 16   Height:      Weight:      SpO2: 98% 95% 95% 95%    Intake/Output Summary (Last 24 hours) at 10/05/12 1145 Last data filed at 10/05/12 0544  Gross per 24 hour  Intake 1967.49 ml  Output   1475 ml  Net 492.49 ml   Filed Weights   09/29/12 0300  Weight: 145.151 kg (320 lb)    Exam: Physical Exam: General: Awake, Oriented, No acute distress. HEENT: EOMI. Neck: Supple CV: S1 and S2 Lungs: Clear to ascultation bilaterally Abdomen: Soft, Nontender, Nondistended, +bowel sounds. Ext: Good pulses. Trace edema.  Right  upper extremity in bandages.  Data Reviewed: Basic Metabolic Panel:  Lab 10/05/12 4540 10/04/12 0425 10/03/12 0438 10/02/12 0525 10/01/12 0600  NA 131* 133* 128* 132* 133*  K  4.2 4.2 4.6 4.2 3.8  CL 96 97 94* 95* 95*  CO2 29 27 26 28 27   GLUCOSE 129* 127* 151* 127* 134*  BUN 19 15 17 20 20   CREATININE 0.99 0.92 0.84 0.91 0.93  CALCIUM 8.7 8.8 8.9 9.3 9.1  MG -- -- -- -- --  PHOS -- -- -- -- --   Liver Function Tests:  Lab 09/28/12 2340  AST 11  ALT 12  ALKPHOS 135*  BILITOT 0.3  PROT 6.4  ALBUMIN 2.9*   CBC:  Lab 10/05/12 0420 10/04/12 0425 10/03/12 0438 10/02/12 0525 10/01/12 0600 09/28/12 2340  WBC 6.7 7.5 9.7 7.5 6.2 --  NEUTROABS -- -- -- -- -- 5.9  HGB 8.8* 10.0* 10.1* 10.6* 10.6* --  HCT 27.6* 30.8* 31.1* 33.2* 33.2* --  MCV 84.4 83.9 82.5 84.1 83.6 --  PLT 211 255 261 297 269 --    Recent Results (from the past 240 hour(s))  URINE CULTURE     Status: Normal   Collection Time   09/29/12 12:21 AM      Component Value Range Status Comment   Specimen Description URINE, CLEAN CATCH   Final    Special Requests NONE   Final    Culture  Setup Time 09/29/2012 01:25   Final    Colony Count >=100,000 COLONIES/ML   Final    Culture ESCHERICHIA COLI   Final    Report Status 09/30/2012 FINAL   Final    Organism ID, Bacteria ESCHERICHIA COLI   Final   SURGICAL PCR SCREEN     Status: Normal   Collection Time   09/30/12  7:58 PM      Component Value Range Status Comment   MRSA, PCR NEGATIVE  NEGATIVE Final    Staphylococcus aureus NEGATIVE  NEGATIVE Final   MRSA PCR SCREENING     Status: Normal   Collection Time   10/02/12  3:25 PM      Component Value Range Status Comment   MRSA by PCR NEGATIVE  NEGATIVE Final   BODY FLUID CULTURE     Status: Normal   Collection Time   10/02/12  6:29 PM      Component Value Range Status Comment   Specimen Description FLUID RIGHT WRIST 1   Final    Special Requests NONE   Final    Gram Stain     Final    Value: RARE WBC PRESENT, PREDOMINANTLY MONONUCLEAR     NO ORGANISMS SEEN   Culture     Final    Value: FEW ENTEROBACTER CLOACAE     Note: CRITICAL RESULT CALLED TO, READ BACK BY AND VERIFIED WITH:  DANNA FRANKLIN@0923  ON [id number] BY [facility]   Report Status 10/05/2012 FINAL   Final    Organism ID, Bacteria ENTEROBACTER CLOACAE   Final   ANAEROBIC CULTURE     Status: Normal (Preliminary result)   Collection Time   10/02/12  6:29 PM      Component Value Range Status Comment   Specimen Description FLUID RIGHT WRIST 1   Final    Special Requests NONE   Final    Gram Stain     Final    Value: RARE WBC PRESENT, PREDOMINANTLY MONONUCLEAR     NO ORGANISMS SEEN   Culture  Final    Value: NO ANAEROBES ISOLATED; CULTURE IN PROGRESS FOR 5 DAYS   Report Status PENDING   Incomplete   TISSUE CULTURE     Status: Normal (Preliminary result)   Collection Time   10/02/12  6:31 PM      Component Value Range Status Comment   Specimen Description WRIST RIGHT 1   Final    Special Requests NONE   Final    Gram Stain     Final    Value: NO WBC SEEN     NO ORGANISMS SEEN   Culture FEW ENTEROBACTER CLOACAE   Final    Report Status PENDING   Incomplete    Organism ID, Bacteria ENTEROBACTER CLOACAE   Final   ANAEROBIC CULTURE     Status: Normal (Preliminary result)   Collection Time   10/02/12  6:31 PM      Component Value Range Status Comment   Specimen Description WRIST RIGHT 1   Final    Special Requests NONE   Final    Gram Stain     Final    Value: NO WBC SEEN     NO ORGANISMS SEEN   Culture     Final    Value: NO ANAEROBES ISOLATED; CULTURE IN PROGRESS FOR 5 DAYS   Report Status PENDING   Incomplete   AFB CULTURE WITH SMEAR     Status: Normal (Preliminary result)   Collection Time   10/02/12  6:31 PM      Component Value Range Status Comment   Specimen Description WRIST RIGHT 1   Final    Special Requests NONE   Final    ACID FAST SMEAR NO ACID FAST BACILLI SEEN   Final    Culture     Final    Value: CULTURE WILL BE EXAMINED FOR 6 WEEKS BEFORE ISSUING A FINAL REPORT   Report Status PENDING   Incomplete   FUNGUS CULTURE W SMEAR     Status: Normal (Preliminary result)   Collection Time     10/02/12  6:31 PM      Component Value Range Status Comment   Specimen Description WRIST RIGHT 1   Final    Special Requests NONE   Final    Fungal Smear NO YEAST OR FUNGAL ELEMENTS SEEN   Final    Culture CULTURE IN PROGRESS FOR FOUR WEEKS   Final    Report Status PENDING   Incomplete   BODY FLUID CULTURE     Status: Normal   Collection Time   10/02/12  6:48 PM      Component Value Range Status Comment   Specimen Description FLUID RIGHT WRIST 2   Final    Special Requests NONE   Final    Gram Stain     Final    Value: RARE WBC PRESENT,BOTH PMN AND MONONUCLEAR     NO ORGANISMS SEEN   Culture     Final    Value: FEW ENTEROBACTER CLOACAE     Note: SUSCEPTIBILITIES PERFORMED ON PREVIOUS CULTURE WITHIN THE LAST 5 DAYS. CRITICAL RESULT CALLED TO, READ BACK BY AND VERIFIED WITH: DANNA FRANKLIN@0923  ON [id number] BY [facility]   Report Status 10/05/2012 FINAL   Final   ANAEROBIC CULTURE     Status: Normal (Preliminary result)   Collection Time   10/02/12  6:48 PM      Component Value Range Status Comment   Specimen Description FLUID RIGHT WRIST 2   Final  Special Requests NONE   Final    Gram Stain     Final    Value: RARE WBC PRESENT,BOTH PMN AND MONONUCLEAR     NO ORGANISMS SEEN   Culture     Final    Value: NO ANAEROBES ISOLATED; CULTURE IN PROGRESS FOR 5 DAYS   Report Status PENDING   Incomplete   TISSUE CULTURE     Status: Normal (Preliminary result)   Collection Time   10/02/12  6:51 PM      Component Value Range Status Comment   Specimen Description WRIST RIGHT 2   Final    Special Requests NONE   Final    Gram Stain     Final    Value: NO WBC SEEN     NO ORGANISMS SEEN   Culture NO GROWTH 2 DAYS   Final    Report Status PENDING   Incomplete   ANAEROBIC CULTURE     Status: Normal (Preliminary result)   Collection Time   10/02/12  6:51 PM      Component Value Range Status Comment   Specimen Description WRIST RIGHT 2   Final    Special Requests NONE   Final    Gram Stain      Final    Value: NO WBC SEEN     NO ORGANISMS SEEN   Culture     Final    Value: NO ANAEROBES ISOLATED; CULTURE IN PROGRESS FOR 5 DAYS   Report Status PENDING   Incomplete   AFB CULTURE WITH SMEAR     Status: Normal (Preliminary result)   Collection Time   10/02/12  6:51 PM      Component Value Range Status Comment   Specimen Description WRIST RIGHT 2   Final    Special Requests NONE   Final    ACID FAST SMEAR NO ACID FAST BACILLI SEEN   Final    Culture     Final    Value: CULTURE WILL BE EXAMINED FOR 6 WEEKS BEFORE ISSUING A FINAL REPORT   Report Status PENDING   Incomplete   BODY FLUID CULTURE     Status: Normal   Collection Time   10/02/12  7:34 PM      Component Value Range Status Comment   Specimen Description FLUID RIGHT WRIST 3   Final    Special Requests NONE   Final    Gram Stain     Final    Value: RARE WBC PRESENT,BOTH PMN AND MONONUCLEAR     NO ORGANISMS SEEN   Culture     Final    Value: ENTEROBACTER CLOACAE     Note: SUSCEPTIBILITIES PERFORMED ON PREVIOUS CULTURE WITHIN THE LAST 5 DAYS. CRITICAL RESULT CALLED TO, READ BACK BY AND VERIFIED WITH: DANNA FRANKLIN@0923  ON [id number] BY [facility]   Report Status 10/05/2012 FINAL   Final   ANAEROBIC CULTURE     Status: Normal (Preliminary result)   Collection Time   10/02/12  7:34 PM      Component Value Range Status Comment   Specimen Description FLUID RIGHT WRIST 3   Final    Special Requests NONE   Final    Gram Stain     Final    Value: RARE WBC PRESENT,BOTH PMN AND MONONUCLEAR     NO ORGANISMS SEEN   Culture     Final    Value: NO ANAEROBES ISOLATED; CULTURE IN PROGRESS FOR 5 DAYS   Report Status PENDING  Incomplete      Studies: No results found.  Scheduled Meds:    . amiodarone  100 mg Oral Daily  . atorvastatin  80 mg Oral Daily  . ceFEPime (MAXIPIME) IV  2 g Intravenous Q12H  . cholecalciferol  2,000 Units Oral Daily  . diltiazem  180 mg Oral Daily  . docusate sodium  100 mg Oral BID  . doxycycline   100 mg Oral QHS  . DULoxetine  60 mg Oral Daily  . furosemide  20 mg Intravenous Once  . levothyroxine  200 mcg Oral QAC breakfast  . multivitamin with minerals  1 tablet Oral Daily  . pantoprazole  40 mg Oral Daily  . polyethylene glycol  17 g Oral Daily  . ranolazine  500 mg Oral BID  . sodium chloride  3 mL Intravenous Q12H  . Tamsulosin HCl  0.4 mg Oral Daily  . vancomycin  1,250 mg Intravenous Q12H   Continuous Infusions:    . sodium chloride 20 mL/hr (10/04/12 1814)    Principal Problem:  *Right femoral fracture Active Problems:  Right forearm fracture  Atrial fibrillation  Hypothyroidism  History of recurrent UTIs  Normocytic anemia  Low testosterone, possible hypogonadism  HTN (hypertension)  Psoriasis  Dyslipidemia  Vitamin D deficiency  GERD (gastroesophageal reflux disease)  Hyperglycemia  Anxiety  Depression  CAD (coronary artery disease)  DJD (degenerative joint disease)  Chronic gingivitis    Cammi Consalvo A, MD  Pager [phone] If 8PM-8AM, please contact night-coverage at [email or web address], password [facility] 10/05/2012, 11:45 AM  LOS: 7 days   Time spent: 20 minutes

## 2012-10-05 NOTE — Anesthesia Postprocedure Evaluation (Signed)
  Anesthesia Post-op Note  Patient: [name]  Procedure(s) Performed: Procedure(s) (LRB): TOTAL HIP REVISION (Right) HARDWARE REMOVAL (Right)  Patient Location: PACU  Anesthesia Type: General  Level of Consciousness: awake and alert   Airway and Oxygen Therapy: Patient Spontanous Breathing  Post-op Pain: mild  Post-op Assessment: Post-op Vital signs reviewed, Patient's Cardiovascular Status Stable, Respiratory Function Stable, Patent Airway and No signs of Nausea or vomiting  Last Vitals:  Filed Vitals:   10/05/12 2321  BP: 125/61  Pulse:   Temp:   Resp:     Post-op Vital Signs: stable   Complications: No apparent anesthesia complications

## 2012-10-05 NOTE — Preoperative (Signed)
Beta Blockers   Reason not to administer Beta Blockers:Not Applicable 

## 2012-10-05 NOTE — Progress Notes (Signed)
CSW spoke with patient & his partner, Casimiro Needle re: SNF bed offers. Patient is accepting bed offer @ [facility] @ SNF to arrange with patient admission paperwork prior to discharge.   Clinical Social Work Department CLINICAL SOCIAL WORK PLACEMENT NOTE 10/05/2012  Patient:  [name], [name]  Account Number:  [id number] Admit date:  09/28/2012  Clinical Social Worker:  Orpah Greek  Date/time:  10/03/2012 01:29 PM  Clinical Social Work is seeking post-discharge placement for this patient at the following level of care:   SKILLED NURSING   (*CSW will update this form in Epic as items are completed)   10/03/2012  Patient/family provided with [facility] Department of Clinical Social Work's list of facilities offering this level of care within the geographic area requested by the patient (or if unable, by the patient's family).  10/03/2012  Patient/family informed of their freedom to choose among providers that offer the needed level of care, that participate in Medicare, Medicaid or managed care program needed by the patient, have an available bed and are willing to accept the patient.  10/03/2012  Patient/family informed of [facility]' ownership interest in [facility], as well as of the fact that they are under no obligation to receive care at this facility.  PASARR submitted to [employer] on 10/03/2012 PASARR number received from [employer] on 10/03/2012  FL2 transmitted to all facilities in geographic area requested by pt/family on  10/03/2012 FL2 transmitted to all facilities within larger geographic area on   Patient informed that his/her managed care company has contracts with or will negotiate with  certain facilities, including the following:     Patient/family informed of bed offers received:  10/05/2012 Patient chooses bed at [facility] Physician recommends and patient chooses bed at    Patient to be transferred to [facility] on   Patient to be transferred to  facility by   The following physician request were entered in Epic:   Additional Comments:  Unice Bailey, LCSW [facility] Clinical Social Worker cell #: [phone]

## 2012-10-06 ENCOUNTER — Encounter (HOSPITAL_COMMUNITY): Payer: Self-pay | Admitting: Orthopedic Surgery

## 2012-10-06 DIAGNOSIS — A498 Other bacterial infections of unspecified site: Secondary | ICD-10-CM

## 2012-10-06 DIAGNOSIS — N390 Urinary tract infection, site not specified: Secondary | ICD-10-CM

## 2012-10-06 DIAGNOSIS — D649 Anemia, unspecified: Secondary | ICD-10-CM | POA: Diagnosis present

## 2012-10-06 LAB — BASIC METABOLIC PANEL
CO2: 27 mEq/L (ref 19–32)
Calcium: 8.8 mg/dL (ref 8.4–10.5)
Chloride: 95 mEq/L — ABNORMAL LOW (ref 96–112)
Creatinine, Ser: 0.85 mg/dL (ref 0.50–1.35)
Glucose, Bld: 185 mg/dL — ABNORMAL HIGH (ref 70–99)

## 2012-10-06 LAB — CBC
HCT: 29 % — ABNORMAL LOW (ref 39.0–52.0)
Hemoglobin: 9.9 g/dL — ABNORMAL LOW (ref 13.0–17.0)
MCH: 27.7 pg (ref 26.0–34.0)
MCV: 81.2 fL (ref 78.0–100.0)
MCV: 81.9 fL (ref 78.0–100.0)
Platelets: 184 10*3/uL (ref 150–400)
Platelets: 186 10*3/uL (ref 150–400)
RBC: 3.57 MIL/uL — ABNORMAL LOW (ref 4.22–5.81)
RDW: 15.9 % — ABNORMAL HIGH (ref 11.5–15.5)
WBC: 10.4 10*3/uL (ref 4.0–10.5)
WBC: 10.2 10*3/uL (ref 4.0–10.5)

## 2012-10-06 LAB — VANCOMYCIN, TROUGH: Vancomycin Tr: 12.9 ug/mL (ref 10.0–20.0)

## 2012-10-06 LAB — TISSUE CULTURE
Culture: NO GROWTH
Gram Stain: NONE SEEN

## 2012-10-06 LAB — TYPE AND SCREEN
ABO/RH(D): A POS
Unit division: 0
Unit division: 0
Unit division: 0

## 2012-10-06 MED ORDER — FERROUS SULFATE 325 (65 FE) MG PO TABS
325.0000 mg | ORAL_TABLET | Freq: Three times a day (TID) | ORAL | Status: DC
  Administered 2012-10-06 – 2012-10-10 (×14): 325 mg via ORAL
  Filled 2012-10-06 (×16): qty 1

## 2012-10-06 MED ORDER — OXYCODONE HCL 5 MG PO TABS
5.0000 mg | ORAL_TABLET | ORAL | Status: DC | PRN
  Administered 2012-10-06 (×2): 10 mg via ORAL
  Administered 2012-10-07: 5 mg via ORAL
  Administered 2012-10-07 (×5): 10 mg via ORAL
  Administered 2012-10-07: 5 mg via ORAL
  Administered 2012-10-08 – 2012-10-10 (×10): 10 mg via ORAL
  Filled 2012-10-06 (×4): qty 2
  Filled 2012-10-06: qty 1
  Filled 2012-10-06 (×8): qty 2
  Filled 2012-10-06: qty 1
  Filled 2012-10-06 (×5): qty 2

## 2012-10-06 MED ORDER — DEXTROSE 5 % IV SOLN
2.0000 g | INTRAVENOUS | Status: DC
  Administered 2012-10-06 – 2012-10-09 (×4): 2 g via INTRAVENOUS
  Filled 2012-10-06 (×5): qty 2

## 2012-10-06 MED ORDER — METOCLOPRAMIDE HCL 10 MG PO TABS: 5.0000 mg | ORAL_TABLET | Freq: Three times a day (TID) | ORAL | Status: DC | PRN

## 2012-10-06 MED ORDER — RIVAROXABAN 20 MG PO TABS
20.0000 mg | ORAL_TABLET | Freq: Every day | ORAL | Status: DC
  Administered 2012-10-07 – 2012-10-09 (×3): 20 mg via ORAL
  Filled 2012-10-06 (×4): qty 1

## 2012-10-06 MED ORDER — VANCOMYCIN HCL 10 G IV SOLR
1500.0000 mg | Freq: Two times a day (BID) | INTRAVENOUS | Status: DC
  Filled 2012-10-06: qty 1500

## 2012-10-06 MED ORDER — METOCLOPRAMIDE HCL 5 MG/ML IJ SOLN: 5.0000 mg | Freq: Three times a day (TID) | INTRAMUSCULAR | Status: DC | PRN

## 2012-10-06 MED ORDER — PHENOL 1.4 % MT LIQD
1.0000 | OROMUCOSAL | Status: DC | PRN
  Filled 2012-10-06: qty 177

## 2012-10-06 MED ORDER — MENTHOL 3 MG MT LOZG
1.0000 | LOZENGE | OROMUCOSAL | Status: DC | PRN
  Filled 2012-10-06: qty 9

## 2012-10-06 NOTE — Evaluation (Signed)
Physical Therapy Evaluation Patient Details Name: [name] MRN: [id number] DOB: 12-12-1945 Today's Date: 10/06/2012 Time: 4742-5956 PT Time Calculation (min): 47 min  PT Assessment / Plan / Recommendation Clinical Impression  Pt. tolerated mobilizing to edge of bed for standing at the platform RW for 4 minutes. Pt. has min c/o pain in R wrist and R hip. Pt is noted to be sweaty, not dizzy, and increased respirations but sats 100% on RA. Pt. will benefit from PT to improve functional mobility . Pt. plans for  SNF.Need clarification for WBS RLE.    PT Assessment  Patient needs continued PT services    Follow Up Recommendations  SNF    Does the patient have the potential to tolerate intense rehabilitation      Barriers to Discharge        Equipment Recommendations  None recommended by PT    Recommendations for Other Services     Frequency 7X/week    Precautions / Restrictions Precautions Precautions: Fall Restrictions Weight Bearing Restrictions: Yes RUE Weight Bearing: Partial weight bearing RUE Partial Weight Bearing Percentage or Pounds: postop order was NWB /RLEand R wrist, Noted changed to  50% 12/27. Left sticky note to clarify.  RLE Weight Bearing: Partial weight bearing RLE Partial Weight Bearing Percentage or Pounds: 50   Pertinent Vitals/Pain HR 111. dats .96% dyspnea 3/4      Mobility  Bed Mobility Bed Mobility: Supine to Sit Supine to Sit: 1: +2 Total assist Supine to Sit: Patient Percentage: 50% Details for Bed Mobility Assistance: Cues for proper technique and hand placement. Transfers Sit to Stand: 1: +2 Total assist;With upper extremity assist;From bed Sit to Stand: Patient Percentage: 70% Stand to Sit: 1: +2 Total assist;With upper extremity assist;To bed Stand to Sit: Patient Percentage: 70% Details for Transfer Assistance: Cues for hand placement, safety, maintaining PWB status to RUE and RLE. Physical A needed to rise, stabilize and  control descent Ambulation/Gait Ambulation/Gait Assistance: Not tested (comment)    Shoulder Instructions     Exercises     PT Diagnosis:    PT Problem List: Decreased strength;Decreased range of motion;Decreased activity tolerance;Decreased mobility;Decreased knowledge of precautions;Decreased safety awareness;Decreased knowledge of use of DME PT Treatment Interventions: DME instruction;Functional mobility training;Therapeutic activities;Therapeutic exercise;Patient/family education   PT Goals Acute Rehab PT Goals PT Goal Formulation: With patient Time For Goal Achievement: 10/20/12 Potential to Achieve Goals: Good Pt will go Supine/Side to Sit: with min assist;with rail PT Goal: Supine/Side to Sit - Progress: Goal set today Pt will go Sit to Supine/Side: with min assist;with rail PT Goal: Sit to Supine/Side - Progress: Goal set today Pt will Transfer Bed to Chair/Chair to Bed: with +2 total assist (pt+70%) PT Transfer Goal: Bed to Chair/Chair to Bed - Progress: Goal set today Pt will Perform Home Exercise Program: with min assist PT Goal: Perform Home Exercise Program - Progress: Goal set today Additional Goals Additional Goal #1: state 3/3 posterior hip precautions PT Goal: Additional Goal #1 - Progress: Goal set today  Visit Information  Last PT Received On: 10/06/12 Assistance Needed: +2    Subjective Data  Subjective: I want to try to get up to standing Patient Stated Goal: to go to [place].   Prior Functioning  Home Living Lives With: Significant other Available Help at Discharge: Available 24 hours/day Type of Home: House Home Access: Ramped entrance Home Layout: One level Bathroom Shower/Tub: Engineer, manufacturing systems: Standard Home Adaptive Equipment: Bedside commode/3-in-1;Walker - rolling;Wheelchair - Designer, television/film set;Tub  transfer bench Prior Function Level of Independence: Needs assistance Needs Assistance: Toileting;Bathing;Dressing;Meal  Prep;Light Housekeeping Bath: Minimal Dressing: Minimal Toileting: Supervision/set-up Meal Prep: Total Light Housekeeping: Total Able to Take Stairs?: No Driving: No Vocation: Retired Musician: No difficulties Dominant Hand: Left    Cognition  Overall Cognitive Status: Appears within functional limits for tasks assessed/performed Arousal/Alertness: Awake/alert Orientation Level: Appears intact for tasks assessed Behavior During Session: [facility] for tasks performed    Extremity/Trunk Assessment Right Upper Extremity Assessment RUE ROM/Strength/Tone: Unable to fully assess;Due to precautions (educated pt to bend/straighten fingers throughout day.) Left Upper Extremity Assessment LUE ROM/Strength/Tone: WFL for tasks assessed Right Lower Extremity Assessment RLE ROM/Strength/Tone: Deficits RLE ROM/Strength/Tone Deficits: pt was able to slide leg to edge of bed with min assist. Left Lower Extremity Assessment LLE ROM/Strength/Tone: [facility] for tasks assessed   Balance Static Sitting Balance Static Sitting - Balance Support: Bilateral upper extremity supported Static Sitting - Level of Assistance: 5: Stand by assistance Static Sitting - Comment/# of Minutes: x5 minutes  End of Session PT - End of Session Activity Tolerance: Patient tolerated treatment well Patient left: in bed;with call bell/phone within reach Nurse Communication: Mobility status  GP     Rada Hay 10/06/2012, 1:22 PM  [phone]

## 2012-10-06 NOTE — Op Note (Signed)
[name], [name]              ACCOUNT NO.:  [id number]  MEDICAL RECORD NO.:  [id number]  LOCATION:  [facility]                         FACILITY:  [facility]  PHYSICIAN:  Madlyn Frankel. Charlann Boxer, M.D.  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  PROCEDURE:  Conversion of failed right total hip surgery to a right total hip replacement.  Procedures included removal of a long gamma nail, distal interlock lag screw.  COMPONENTS USED:  DePuy hip system with size 60 pinnacle shell, 2 cancellous screws, 36+ 4 neutral AltrX liner.  We then used a size 19.5 mm large body 10 inch bowed right hip solution stem.  A 36 +12 ceramic ball was utilized.  SURGEON:  Madlyn Frankel. Charlann Boxer, M.D.  ASSISTANT:  Joseph Gins, PA Please note that [name] was present for the entire case, perioperative positioning, perioperative management operative extremity, protection of vital structures, general facilitation of the case, and primary wound closure.  ANESTHESIA:  General.  SPECIMENS:  None.  FINDINGS:  The patient was noted to have clear synovial fluid. Significant nonunion of the proximal femur including the proximal femur and extension into the mid shaft region, but no signs of purulence or infection in the hip joint at all.  DRAINS:  One medium Hemovac.  BLOOD LOSS:  Approximately 3 L.  The patient did receive 4 units of blood intraoperatively in addition to preoperatively 2 units of blood.  INDICATIONS FOR PROCEDURE:  [name] is a 66 year old male who had sustained right distal radius fracture and a right proximal humerus fracture that are fixed approximately over the last 6 months.  This  was performed at an outside facility.  The patient had been transferred on referral basis to [name] for assistance in definitive management due to the nonunion. Unfortunately had progressive pain with evidence of failure of his right proximal hip screw with migration into the acetabulum.  [name] was admitted to the hospital on December 19 for pain.  [name] had performed a debridement of his right wrist with a spanning plate approximately 3 days prior to this procedure.  I had a lengthy discussion with [name] regarding his current situation and the potential options with the best for suitable option due to his nonunion and incised for total hip replacement.  Risks of infection, DVT, dislocation and need for future revision surgery were all discussed reviewed with him.  Consent was obtained for benefit of pain relief and management of his fracture.  PROCEDURE IN DETAIL:  [name] was brought to the operative theater. Once adequate anesthesia was established, he was transferred to hospital bed, and then positioned in the left lateral decubitus position in the right side up.  The patient had been treated based on findings intraoperatively with his right hip and the right wrist for with vancomycin and third generation cephalosporin.  He had been receiving those throughout the  day and the last 48 hours.  At this point we pre- scrubbed his right hip, pre-draped it and then prepped and draped the right hip with DuraPrep prep.  A time-out was performed identifying the patient, planned procedure, and extremity.  At this point, I planned out a large lateral based incision incorporating the incision for the lag screw, extending for a posterior approach.  The distal interlock area was identified as well.  Following a time-out, attention was first directed to the distal interlock.  I excised the scar, dissected down to the iliotibial band and identify the location of  the screw.  Based on radiographic findings of an extension of the fracture/nonunited segment on the shaft of the femur.  I went ahead and dissected out the posterior aspect of the hip.  The iliotibial band and gluteal fascia were incised posteriorly.  At this point, the vastus lateralis was elevated off the posterior intermuscular septum anteriorly.  Based on measurements from radiographs, I placed a cable along the shaft and I felt at this point, was hoping that this is going to capture the fracture segment.  This was crimped and tight.  At this point, the proximal femur was exposed.  Preoperatively, the patient noted to have a significant amount of comminution and nonunions in this area  radiographically.  A lot of time was spent at the time debriding this nonunited bone fragment throughout the hip.  The proximal portion of the nail was readily identified within this comminuted nonunited segment.  The greater trochanter was noted to have not united but was noted to have intact musculature including at the vastus ridge the vastus lateralis and gluteus musculature.  The femoral head was eventually removed, albeit in segments as well as other fragments.  Once I had the hip exposed, I went ahead and returned back down distally and removed the distal interlock screw and then identified the lag screw which actually was removed without any difficulty at all, and then removed the nail without difficulty.  At this point, based on the assumption that I had a cable around the prosthesis, I went ahead and flexed the hip up internally rotated and exposed the proximal femur, debriding further bone soft tissues is necessary.  Basically the entire proximal femur will was non-existent with significant displacement nonunited lesser trochanter.  At this point, I began reaming and reamed up all the way to a 19 mm.  I was able to detect bone chatter against the reamer at this point.  Once I had  finished reaming the femur and I then turned attention to the acetabulum.  The acetabular retractors were placed.  I began reaming with a 53 reamer and reamed up to 59 reamer and chose a 60 mm cup based on the defect caused by the lag screw, I did pack 10 mL of cancellous bone graft in combination with autograft from the femoral head that was ground up with a reamer.  This was then reversed reamed and filled the defects in the bony acetabulum. The 60 mm cup was then impacted.  The cup was seen to be well seated at about 40 degrees of abduction and 25 degrees of forward flexion, I placed 2 cancellous screws into the ilium.  His bone quality was noted to be fairly poor.  The final 36+ 4 neutral AltrX liner was impacted into position.  At this point, I spent time including using radiographic intraoperative plain films to determine that I was going to need to use a  10-inch stem to bypass the fracture segment also identified that in fact this was a nonunited segment extending from the proximal femur down to the mid shaft and that my previously placed cable was just distal to this.  With a trial component in placed, after obtaining radiographs I used a Jackson clamp and reduced the fracture and then placed three more cables at this point provisionally.  At this point, to try and best due to significantly abnormal anatomy evaluating his leg.  I ended up selecting this 10 inch bowed right solution stem at 19.5 mm.  A marking based on trial reductions of the depth of penetration of the solution stem.  We selected the final stem and impacted it, keep it anteversion at about 15-20 degrees.  It appeared to be fit with good purchase.  I do not know if I could call it excellent purchase at this point.  However it appeared to have good rotational stability within shaft of the femur particularly related to the bowed nature the component.  At this point, I re-did trials and I ended up selecting a 36+  12 ball to help with length and offset to make certain that I would to have enough stability to this hip given the significant loss of soft tissue structure.  The final 36+ 12 ceramic ball was then impacted onto the clean and dry trunnion and the hip was reduced.  We irrigated the hip throughout the case and again at this point, I reapproximated with #1 Vicryl posterior pseudocapsular capsular tissue.  I then reapproximated the iliotibial band and gluteal fashion using a combination of #1 Vicryl sutures in 0 V- Loc sutures x2.  Please note, we placed a medium Hemovac drain deep.  At this point, I reapproximated the subcu layer using 2-0 Vicryl and then staples were on the skin. Skin was cleaned, dried, and dressed sterilely using Xeroform and a bulky dressing.  The patient was then extubated, brought to recovery room in stable condition.  At this point, this seemed to have tolerated the procedure well despite the blood loss, and blood transfusions and the 3-hour procedure.  Please note that in the procedure the conversion of previous failed right hip surgery to right total hip replacement using allograft and autograft bone graft.     Madlyn Frankel Charlann Boxer, M.D.     MDO/MEDQ  D:  10/05/2012  T:  10/06/2012  Job:  [id number]

## 2012-10-06 NOTE — Evaluation (Signed)
Occupational Therapy Evaluation Patient Details Name: [name] MRN: [id number] DOB: 1946/02/15 Today's Date: 10/06/2012 Time: 4098-1191 OT Time Calculation (min): 60 min  OT Assessment / Plan / Recommendation Clinical Impression  Pt presents with a R total hip revision and ORIF of R wrist. Skilled OT indicated to address the below mentioned deficits in prep for d/c to next venue of care.    OT Assessment  Patient needs continued OT Services    Follow Up Recommendations  SNF    Barriers to Discharge Decreased caregiver support    Equipment Recommendations  None recommended by OT    Recommendations for Other Services    Frequency  Min 2X/week    Precautions / Restrictions Precautions Precautions: Fall Restrictions Weight Bearing Restrictions: Yes RUE Weight Bearing: Partial weight bearing RUE Partial Weight Bearing Percentage or Pounds: 50 RLE Weight Bearing: Partial weight bearing RLE Partial Weight Bearing Percentage or Pounds: 50   Pertinent Vitals/Pain Pt denied pain and vitals were stable on RA.    ADL  Grooming: Wash/dry face;Minimal assistance Where Assessed - Grooming: Unsupported sitting Upper Body Bathing: Moderate assistance Where Assessed - Upper Body Bathing: Unsupported sitting Lower Body Bathing: +2 Total assistance Lower Body Bathing: Patient Percentage: 30% Where Assessed - Lower Body Bathing: Supported sit to stand Upper Body Dressing: Moderate assistance Where Assessed - Upper Body Dressing: Unsupported sitting Lower Body Dressing: +2 Total assistance Lower Body Dressing: Patient Percentage: 20% Where Assessed - Lower Body Dressing: Supported sit to stand Toilet Transfer: +2 Total assistance Toilet Transfer: Patient Percentage: 50% Toilet Transfer Method: Sit to stand Equipment Used: Other (comment) (PFRW) Transfers/Ambulation Related to ADLs: Utilized PFRW to stand. Pt unable to take any steps at this time. RUE heavily dressed, making it  difficult to use PF attachment. Fatigues quickly.    OT Diagnosis: Generalized weakness  OT Problem List: Decreased activity tolerance;Decreased knowledge of use of DME or AE;Impaired UE functional use OT Treatment Interventions: Self-care/ADL training;Therapeutic activities;DME and/or AE instruction;Patient/family education   OT Goals Acute Rehab OT Goals OT Goal Formulation: With patient Potential to Achieve Goals: Good ADL Goals Pt Will Perform Grooming: with min assist;Standing at sink ADL Goal: Grooming - Progress: Goal set today Pt Will Transfer to Toilet: with min assist;with DME;Ambulation;Stand pivot transfer;Maintaining hip precautions;Maintaining weight bearing status ADL Goal: Toilet Transfer - Progress: Goal set today Pt Will Perform Toileting - Clothing Manipulation: with min assist;Sitting on 3-in-1 or toilet;Standing ADL Goal: Toileting - Clothing Manipulation - Progress: Goal set today Pt Will Perform Toileting - Hygiene: with min assist;Sit to stand from 3-in-1/toilet ADL Goal: Toileting - Hygiene - Progress: Goal set today Additional ADL Goal #1: Pt will complete all aspects of bathing and dressing at min A level utilizing AE and compensatory strategies with min cues.  Visit Information  Last OT Received On: 10/06/12 Assistance Needed: +2 PT/OT Co-Evaluation/Treatment: Yes    Subjective Data  Subjective: You just don't know what I've been through. Patient Stated Goal: Go to [facility] for rehab.   Prior Functioning     Home Living Lives With: Significant other Available Help at Discharge: Available 24 hours/day Type of Home: House Home Access: Ramped entrance Home Layout: One level Bathroom Shower/Tub: Engineer, manufacturing systems: Standard Home Adaptive Equipment: Bedside commode/3-in-1;Walker - rolling;Wheelchair - manual;Sock aid;Reacher;Tub transfer bench Prior Function Level of Independence: Needs assistance Needs Assistance:  Toileting;Bathing;Dressing;Meal Prep;Light Housekeeping Bath: Minimal Dressing: Minimal Toileting: Supervision/set-up Meal Prep: Total Light Housekeeping: Total Able to Take Stairs?: No Driving: No  Vocation: Retired Musician: No difficulties Dominant Hand: Left         Vision/Perception     Cognition  Overall Cognitive Status: Appears within functional limits for tasks assessed/performed Arousal/Alertness: Awake/alert Orientation Level: Appears intact for tasks assessed Behavior During Session: [facility] for tasks performed    Extremity/Trunk Assessment Right Upper Extremity Assessment RUE ROM/Strength/Tone: Unable to fully assess;Due to precautions (educated pt to bend/straighten fingers throughout day.) Left Upper Extremity Assessment LUE ROM/Strength/Tone: WFL for tasks assessed     Mobility Bed Mobility Bed Mobility: Supine to Sit Supine to Sit: 1: +2 Total assist Supine to Sit: Patient Percentage: 50% Details for Bed Mobility Assistance: Cues for proper technique and hand placement. Transfers Transfers: Sit to Stand;Stand to Sit Sit to Stand: 1: +2 Total assist;With upper extremity assist;From bed Sit to Stand: Patient Percentage: 70% Stand to Sit: 1: +2 Total assist;With upper extremity assist;To bed Stand to Sit: Patient Percentage: 70% Details for Transfer Assistance: Cues for hand placement, safety, maintaining PWB status to RUE and RLE. Physical A needed to rise, stabilize and control descent.     Shoulder Instructions     Exercise     Balance     End of Session OT - End of Session Activity Tolerance: Patient tolerated treatment well Patient left: in bed;with call bell/phone within reach  GO     Raechel Marcos A OTR/L [phone] 10/06/2012, 1:05 PM

## 2012-10-06 NOTE — Progress Notes (Signed)
Pt just arrived to ICU from another floor. He has his cpap machine from home & will place on & off himself.  Jacqulynn Cadet RRT

## 2012-10-06 NOTE — Progress Notes (Signed)
Report called to [place], Charity fundraiser. Transfer to room 1610 via icu bed.

## 2012-10-06 NOTE — Progress Notes (Signed)
[id number]/Rhonda Davis, RN, BSN, CCM: CHART REVIEWED AND UPDATED.  Next chart review due on 12302013. NO DISCHARGE NEEDS PRESENT AT THIS TIME. CASE MANAGEMENT [phone] 

## 2012-10-06 NOTE — Progress Notes (Signed)
[facility] PROGRESS NOTE  [name] ZOX:[id number] DOB: 1946-02-21 DOA: 09/28/2012 PCP: Ebbie Ridge, MD PCP - Dr. Thomasena Edis in [place]. Orthopedics--Dr. Amanda Pea  Assessment/Plan: Right femoral fracture s/p mechanical fall Conversion of failed right total hip surgery to a right total hip replacement on 10/05/2012. Up with therapy 50% weight bearing on both the right leg and right arm. Has a wound vac in place.  Right forearm fracture and wound with hardware failure and nonunion of right radius and ulna.  Patient had removal of hardware, irrigation and debridement on 10/02/2012.  Currently on vancomycin and cefepime on 10/03/2012 by ID.  Antibiotic management per ID, Dr. Campbell/Dr. Ninetta Lights.  Wound cultures from 10/02/2012 results as indicated below. Will need grafting the defect at a later date.  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  Sensitive to cefepime, ceftazidime, ceftriaxone, ciprofloxacin, gentamicin, Zosyn, Bactrim and tobramycin.  Further management as per ID.  PICC line placed on 10/04/2012.    Normocytic anemia Stable.  May have mild blood loss anemia from surgery.  S/p 6 units of pRBC transfusion on 10/05/2012.   E. Coli UTI Completed Rocephin 12/20-12/22.  Atrial fibrillation Stable, in SR on EKG on admission and telemetry. Xarelto on hold for surgery. Currently on SCDs, was on Lovenox until 10/05/2012. Continue diltiazem, amiodarone. Can resume Xarelto post-op per orthopedics.  HTN Stable.  Continue diltiazem.  Hypothyroidism TSH mildly high at admission. Continue levothyroxine, defer to primary care physician for making any adjustments.  Obstructive sleep apnea Continue CPAP.  Hyponatremia Etiology unclear.  Likely related to pain.  Continue to monitor.  Appears to be chronic.  Prophylaxis SCDs. Anticoagulation as per ortho.  Code Status: Full code Family Communication: No  family at bedside. Disposition Plan: SNF, possibly on 10/09/2012?  Brief narrative: 66 year old male with history of hypertension, atrial fibrillation, hyperlipidemia, hypothyroidism was brought to the ER patient had a fall at his house and experienced right hip pain. Patient has had a fracture of the same area in August this year and had undergone surgery. X-rays reveal periprosthetic fracture. Patient states that he slipped and fell did not lose consciousness. Denies any chest pain or shortness of breath. Patient has been admitted for further management. Orthopedics on call was consulted.  Consultants:  Orthopedics Dr. Amanda Pea and Dr. Charlann Boxer  ID, Dr. Campbell/Dr. Ninetta Lights  PT--SNF  Procedures:  Removal of hardware, irrigation and debridement of the right forearm on 10/02/2012.  Antibiotics:  Rocephin 12/20 >> 12/22  Doxycycline on prior to admission.  Cefazolin 10/03/2012 >> 10/03/2012  Vancomycin 10/03/2012 >>  Cefepime 10/03/2012 >>  HPI/Subjective: Has some hip pain from surgery. No other specific concerns.  Objective: Filed Vitals:   10/06/12 0554 10/06/12 0600 10/06/12 0700 10/06/12 0800  BP: 135/70 144/63 150/62   Pulse: 84 84 85   Temp:    98.2 F (36.8 C)  TempSrc:    Oral  Resp: 16 18 15 16   Height:      Weight:      SpO2: 100% 100% 90% 95%    Intake/Output Summary (Last 24 hours) at 10/06/12 0847 Last data filed at 10/06/12 0800  Gross per 24 hour  Intake 8440.33 ml  Output   6275 ml  Net 2165.33 ml   Filed Weights   09/29/12 0300 10/05/12 2355  Weight: 145.151 kg (320 lb) 151 kg (332 lb 14.3 oz)    Exam: Physical Exam: General: Awake, Oriented, No acute distress. HEENT: EOMI. Neck: Supple CV:  S1 and S2 Lungs: Clear to ascultation bilaterally Abdomen: Soft, Nontender, Nondistended, +bowel sounds. Ext: Good pulses. Trace edema.  Right upper extremity in bandages. Drain in right hip.  Data Reviewed: Basic Metabolic Panel:  Lab 10/06/12  0530 10/05/12 0420 10/04/12 0425 10/03/12 0438 10/02/12 0525  NA 130* 131* 133* 128* 132*  K 4.7 4.2 4.2 4.6 4.2  CL 95* 96 97 94* 95*  CO2 27 29 27 26 28   GLUCOSE 185* 129* 127* 151* 127*  BUN 22 19 15 17 20   CREATININE 0.85 0.99 0.92 0.84 0.91  CALCIUM 8.8 8.7 8.8 8.9 9.3  MG -- -- -- -- --  PHOS -- -- -- -- --   Liver Function Tests: No results found for this basename: AST:5,ALT:5,ALKPHOS:5,BILITOT:5,PROT:5,ALBUMIN:5 in the last 168 hours CBC:  Lab 10/06/12 0530 10/05/12 0420 10/04/12 0425 10/03/12 0438 10/02/12 0525  WBC 10.4 6.7 7.5 9.7 7.5  NEUTROABS -- -- -- -- --  HGB 9.9* 8.8* 10.0* 10.1* 10.6*  HCT 29.0* 27.6* 30.8* 31.1* 33.2*  MCV 81.2 84.4 83.9 82.5 84.1  PLT 184 211 255 261 297    Recent Results (from the past 240 hour(s))  URINE CULTURE     Status: Normal   Collection Time   09/29/12 12:21 AM      Component Value Range Status Comment   Specimen Description URINE, CLEAN CATCH   Final    Special Requests NONE   Final    Culture  Setup Time 09/29/2012 01:25   Final    Colony Count >=100,000 COLONIES/ML   Final    Culture ESCHERICHIA COLI   Final    Report Status 09/30/2012 FINAL   Final    Organism ID, Bacteria ESCHERICHIA COLI   Final   SURGICAL PCR SCREEN     Status: Normal   Collection Time   09/30/12  7:58 PM      Component Value Range Status Comment   MRSA, PCR NEGATIVE  NEGATIVE Final    Staphylococcus aureus NEGATIVE  NEGATIVE Final   MRSA PCR SCREENING     Status: Normal   Collection Time   10/02/12  3:25 PM      Component Value Range Status Comment   MRSA by PCR NEGATIVE  NEGATIVE Final   BODY FLUID CULTURE     Status: Normal   Collection Time   10/02/12  6:29 PM      Component Value Range Status Comment   Specimen Description FLUID RIGHT WRIST 1   Final    Special Requests NONE   Final    Gram Stain     Final    Value: RARE WBC PRESENT, PREDOMINANTLY MONONUCLEAR     NO ORGANISMS SEEN   Culture     Final    Value: FEW ENTEROBACTER CLOACAE      Note: CRITICAL RESULT CALLED TO, READ BACK BY AND VERIFIED WITH: DANNA FRANKLIN@0923  ON [id number] BY [facility]   Report Status 10/05/2012 FINAL   Final    Organism ID, Bacteria ENTEROBACTER CLOACAE   Final   ANAEROBIC CULTURE     Status: Normal (Preliminary result)   Collection Time   10/02/12  6:29 PM      Component Value Range Status Comment   Specimen Description FLUID RIGHT WRIST 1   Final    Special Requests NONE   Final    Gram Stain     Final    Value: RARE WBC PRESENT, PREDOMINANTLY MONONUCLEAR     NO ORGANISMS SEEN  Culture     Final    Value: NO ANAEROBES ISOLATED; CULTURE IN PROGRESS FOR 5 DAYS   Report Status PENDING   Incomplete   TISSUE CULTURE     Status: Normal (Preliminary result)   Collection Time   10/02/12  6:31 PM      Component Value Range Status Comment   Specimen Description WRIST RIGHT 1   Final    Special Requests NONE   Final    Gram Stain     Final    Value: NO WBC SEEN     NO ORGANISMS SEEN   Culture FEW ENTEROBACTER CLOACAE   Final    Report Status PENDING   Incomplete    Organism ID, Bacteria ENTEROBACTER CLOACAE   Final   ANAEROBIC CULTURE     Status: Normal (Preliminary result)   Collection Time   10/02/12  6:31 PM      Component Value Range Status Comment   Specimen Description WRIST RIGHT 1   Final    Special Requests NONE   Final    Gram Stain     Final    Value: NO WBC SEEN     NO ORGANISMS SEEN   Culture     Final    Value: NO ANAEROBES ISOLATED; CULTURE IN PROGRESS FOR 5 DAYS   Report Status PENDING   Incomplete   AFB CULTURE WITH SMEAR     Status: Normal (Preliminary result)   Collection Time   10/02/12  6:31 PM      Component Value Range Status Comment   Specimen Description WRIST RIGHT 1   Final    Special Requests NONE   Final    ACID FAST SMEAR NO ACID FAST BACILLI SEEN   Final    Culture     Final    Value: CULTURE WILL BE EXAMINED FOR 6 WEEKS BEFORE ISSUING A FINAL REPORT   Report Status PENDING   Incomplete   FUNGUS  CULTURE W SMEAR     Status: Normal (Preliminary result)   Collection Time   10/02/12  6:31 PM      Component Value Range Status Comment   Specimen Description WRIST RIGHT 1   Final    Special Requests NONE   Final    Fungal Smear NO YEAST OR FUNGAL ELEMENTS SEEN   Final    Culture CULTURE IN PROGRESS FOR FOUR WEEKS   Final    Report Status PENDING   Incomplete   BODY FLUID CULTURE     Status: Normal   Collection Time   10/02/12  6:48 PM      Component Value Range Status Comment   Specimen Description FLUID RIGHT WRIST 2   Final    Special Requests NONE   Final    Gram Stain     Final    Value: RARE WBC PRESENT,BOTH PMN AND MONONUCLEAR     NO ORGANISMS SEEN   Culture     Final    Value: FEW ENTEROBACTER CLOACAE     Note: SUSCEPTIBILITIES PERFORMED ON PREVIOUS CULTURE WITHIN THE LAST 5 DAYS. CRITICAL RESULT CALLED TO, READ BACK BY AND VERIFIED WITH: DANNA FRANKLIN@0923  ON [id number] BY [facility]   Report Status 10/05/2012 FINAL   Final   ANAEROBIC CULTURE     Status: Normal (Preliminary result)   Collection Time   10/02/12  6:48 PM      Component Value Range Status Comment   Specimen Description FLUID RIGHT WRIST 2  Final    Special Requests NONE   Final    Gram Stain     Final    Value: RARE WBC PRESENT,BOTH PMN AND MONONUCLEAR     NO ORGANISMS SEEN   Culture     Final    Value: NO ANAEROBES ISOLATED; CULTURE IN PROGRESS FOR 5 DAYS   Report Status PENDING   Incomplete   TISSUE CULTURE     Status: Normal   Collection Time   10/02/12  6:51 PM      Component Value Range Status Comment   Specimen Description WRIST RIGHT 2   Final    Special Requests NONE   Final    Gram Stain     Final    Value: NO WBC SEEN     NO ORGANISMS SEEN   Culture NO GROWTH 3 DAYS   Final    Report Status 10/06/2012 FINAL   Final   ANAEROBIC CULTURE     Status: Normal (Preliminary result)   Collection Time   10/02/12  6:51 PM      Component Value Range Status Comment   Specimen Description WRIST RIGHT 2    Final    Special Requests NONE   Final    Gram Stain     Final    Value: NO WBC SEEN     NO ORGANISMS SEEN   Culture     Final    Value: NO ANAEROBES ISOLATED; CULTURE IN PROGRESS FOR 5 DAYS   Report Status PENDING   Incomplete   AFB CULTURE WITH SMEAR     Status: Normal (Preliminary result)   Collection Time   10/02/12  6:51 PM      Component Value Range Status Comment   Specimen Description WRIST RIGHT 2   Final    Special Requests NONE   Final    ACID FAST SMEAR NO ACID FAST BACILLI SEEN   Final    Culture     Final    Value: CULTURE WILL BE EXAMINED FOR 6 WEEKS BEFORE ISSUING A FINAL REPORT   Report Status PENDING   Incomplete   BODY FLUID CULTURE     Status: Normal   Collection Time   10/02/12  7:34 PM      Component Value Range Status Comment   Specimen Description FLUID RIGHT WRIST 3   Final    Special Requests NONE   Final    Gram Stain     Final    Value: RARE WBC PRESENT,BOTH PMN AND MONONUCLEAR     NO ORGANISMS SEEN   Culture     Final    Value: ENTEROBACTER CLOACAE     Note: SUSCEPTIBILITIES PERFORMED ON PREVIOUS CULTURE WITHIN THE LAST 5 DAYS. CRITICAL RESULT CALLED TO, READ BACK BY AND VERIFIED WITH: DANNA FRANKLIN@0923  ON [id number] BY [facility]   Report Status 10/05/2012 FINAL   Final   ANAEROBIC CULTURE     Status: Normal (Preliminary result)   Collection Time   10/02/12  7:34 PM      Component Value Range Status Comment   Specimen Description FLUID RIGHT WRIST 3   Final    Special Requests NONE   Final    Gram Stain     Final    Value: RARE WBC PRESENT,BOTH PMN AND MONONUCLEAR     NO ORGANISMS SEEN   Culture     Final    Value: NO ANAEROBES ISOLATED; CULTURE IN PROGRESS FOR 5 DAYS   Report Status  PENDING   Incomplete      Studies: Dg Pelvis Portable  10/06/2012  *RADIOLOGY REPORT*  Clinical Data: Postoperative radiograph, status post total right hip arthroplasty.  PORTABLE PELVIS  Comparison: Pelvis radiograph performed 09/28/2012  Findings: There has been  successful placement of a total right hip arthroplasty, in expected position, without evidence of new fracture or loosening.  Prominent heterotopic bone formation is again noted about the comminuted fracture at the proximal femoral diaphysis and femoral trochanters.  Visualized cerclage wires appear grossly intact.  The left hip joint is unremarkable in appearance.  Mild sclerotic change is seen at the sacroiliac joints.  Mild degenerative change is noted at the lower lumbar spine.  The visualized bowel gas pattern is grossly unremarkable.  IMPRESSION: Right total hip arthroplasty is grossly unremarkable in appearance, without evidence of new fracture or loosening.  Prominent heterotopic bone formation again noted about the comminuted fracture at the proximal femoral diaphysis and femoral trochanters.   Original Report Authenticated By: Tonia Ghent, M.D.    Dg Hip Portable 1 View Right  10/05/2012  *RADIOLOGY REPORT*  Clinical Data: Right hip surgery  PORTABLE RIGHT HIP - 1 VIEW  Comparison: Earlier film of the same day  Findings: This second intraoperative radiograph shows placement of additional cerclage wires about the oblique fracture of the proximal femoral shaft, fragments in near anatomic alignment. Femoral and acetabular components of hip arthroplasty project in expected location.  Component of the knee arthroplasty is partially seen.  IMPRESSION: Right hip arthroplasty with cerclage fixation of proximal femoral shaft oblique fracture   Original Report Authenticated By: D. Andria Rhein, MD    Dg Hip Portable 1 View Right  10/05/2012  *RADIOLOGY REPORT*  Clinical Data: The hip replacement surgery  PORTABLE RIGHT HIP - 1 VIEW  Comparison: 09/28/2012  Findings: Single intraoperative image documents removal of the IM rod.  Acetabular component hip arthroplasty projects in expected location.  Femoral component spacer is noted, incompletely visualized.  There is a cerclage wire around the mid femoral  shaft.  IMPRESSION:  Removal of IM rod and changes of right hip replacement surgery.   Original Report Authenticated By: D. Andria Rhein, MD     Scheduled Meds:    . amiodarone  100 mg Oral Daily  . atorvastatin  80 mg Oral Daily  . calcium chloride  1 g Intravenous Once  . calcium chloride  1 g Intravenous Once  . ceFEPime (MAXIPIME) IV  2 g Intravenous Q12H  . cholecalciferol  2,000 Units Oral Daily  . diltiazem  180 mg Oral Daily  . docusate sodium  100 mg Oral BID  . doxycycline  100 mg Oral QHS  . DULoxetine  60 mg Oral Daily  . ferrous sulfate  325 mg Oral TID PC  . levothyroxine  200 mcg Oral QAC breakfast  . multivitamin with minerals  1 tablet Oral Daily  . pantoprazole  40 mg Oral Daily  . polyethylene glycol  17 g Oral Daily  . ranolazine  500 mg Oral BID  . Rivaroxaban  20 mg Oral Q supper  . sodium chloride  3 mL Intravenous Q12H  . Tamsulosin HCl  0.4 mg Oral Daily  . vancomycin  1,500 mg Intravenous Q12H   Continuous Infusions:    . sodium chloride 20 mL/hr at 10/06/12 0040    Principal Problem:  *Right femoral fracture Active Problems:  Right forearm fracture  Atrial fibrillation  Hypothyroidism  History of recurrent UTIs  Normocytic anemia  Low testosterone, possible hypogonadism  HTN (hypertension)  Psoriasis  Dyslipidemia  Vitamin D deficiency  GERD (gastroesophageal reflux disease)  Hyperglycemia  Anxiety  Depression  CAD (coronary artery disease)  DJD (degenerative joint disease)  Chronic gingivitis    Karynn Deblasi A, MD  Pager [phone] If 8PM-8AM, please contact night-coverage at [email or web address], password [facility] 10/06/2012, 8:47 AM  LOS: 8 days   Time spent: 20 minutes

## 2012-10-06 NOTE — Progress Notes (Signed)
INFECTIOUS DISEASE PROGRESS NOTE  ID: [name] is a 66 y.o. male with   Principal Problem:  *Right femoral fracture Active Problems:  Right forearm fracture  Atrial fibrillation  Hypothyroidism  History of recurrent UTIs  Normocytic anemia  Low testosterone, possible hypogonadism  HTN (hypertension)  Psoriasis  Dyslipidemia  Vitamin D deficiency  GERD (gastroesophageal reflux disease)  Hyperglycemia  Anxiety  Depression  CAD (coronary artery disease)  DJD (degenerative joint disease)  Chronic gingivitis  Anemia  Subjective: 66 yo M with fracture of R wrist and femur 05-2012. He had poor wound healing of his R wrist and on has also had a fall with re-injury of his R hip.  His hx is also complicated by chronic gingivitis, on chronic doxy and also being on enbrel for his psoriasis.  He was started on vanco/cefepime on 12-24.  On 12-23 he underwent I & D of his R wrist, removal of prosthetic material, placement of new plate.  On 12-26 he underwent repair of his R hip (new THR).  His Cx of his wrist has grown E cloacae (R- ancef, cefoxitin).  Without complaints, no problems with anbx (no diarrhea).   Abtx:  Anti-infectives     Start     Dose/Rate Route Frequency Ordered Stop   10/06/12 1800   vancomycin (VANCOCIN) 1,500 mg in sodium chloride 0.9 % 500 mL IVPB        1,500 mg 250 mL/hr over 120 Minutes Intravenous Every 12 hours 10/06/12 0644     10/04/12 1800   vancomycin (VANCOCIN) 1,250 mg in sodium chloride 0.9 % 250 mL IVPB  Status:  Discontinued        1,250 mg 166.7 mL/hr over 90 Minutes Intravenous Every 12 hours 10/04/12 1040 10/06/12 0645   10/04/12 0600   vancomycin (VANCOCIN) 1,500 mg in sodium chloride 0.9 % 500 mL IVPB  Status:  Discontinued        1,500 mg 250 mL/hr over 120 Minutes Intravenous Every 12 hours 10/03/12 1451 10/04/12 1040   10/03/12 1600   vancomycin (VANCOCIN) 2,500 mg in sodium chloride 0.9 % 500 mL IVPB        2,500 mg 250  mL/hr over 120 Minutes Intravenous  Once 10/03/12 1451 10/03/12 1849   10/03/12 1500   ceFEPIme (MAXIPIME) 2 g in dextrose 5 % 50 mL IVPB        2 g 100 mL/hr over 30 Minutes Intravenous Every 12 hours 10/03/12 1408     10/03/12 0200   ceFAZolin (ANCEF) IVPB 2 g/50 mL premix  Status:  Discontinued        2 g 100 mL/hr over 30 Minutes Intravenous Every 8 hours 10/02/12 2216 10/03/12 1408   10/02/12 2030   tobramycin (NEBCIN) powder  Status:  Discontinued          As needed 10/02/12 2217 10/02/12 2335   10/02/12 2030   vancomycin (VANCOCIN) powder  Status:  Discontinued          As needed 10/02/12 2218 10/02/12 2335   09/29/12 2200   doxycycline (VIBRA-TABS) tablet 100 mg        100 mg Oral Daily at bedtime 09/29/12 0311     09/29/12 1000   cefTRIAXone (ROCEPHIN) 1 g in dextrose 5 % 50 mL IVPB  Status:  Discontinued        1 g 100 mL/hr over 30 Minutes Intravenous Every 24 hours 09/29/12 0854 10/01/12 1053  Medications:  Scheduled:   . amiodarone  100 mg Oral Daily  . atorvastatin  80 mg Oral Daily  . calcium chloride  1 g Intravenous Once  . calcium chloride  1 g Intravenous Once  . ceFEPime (MAXIPIME) IV  2 g Intravenous Q12H  . cholecalciferol  2,000 Units Oral Daily  . diltiazem  180 mg Oral Daily  . docusate sodium  100 mg Oral BID  . doxycycline  100 mg Oral QHS  . DULoxetine  60 mg Oral Daily  . ferrous sulfate  325 mg Oral TID PC  . levothyroxine  200 mcg Oral QAC breakfast  . multivitamin with minerals  1 tablet Oral Daily  . pantoprazole  40 mg Oral Daily  . polyethylene glycol  17 g Oral Daily  . ranolazine  500 mg Oral BID  . Rivaroxaban  20 mg Oral Q supper  . sodium chloride  3 mL Intravenous Q12H  . Tamsulosin HCl  0.4 mg Oral Daily  . vancomycin  1,500 mg Intravenous Q12H    Objective: Vital signs in last 24 hours: Temp:  [97.5 F (36.4 C)-98.7 F (37.1 C)] 98.1 F (36.7 C) (12/27 1200) Pulse Rate:  [72-107] 107  (12/27 1135) Resp:   [13-97] 97  (12/27 1200) BP: (88-158)/(48-82) 137/82 mmHg (12/27 1135) SpO2:  [19 %-100 %] 98 % (12/27 1330) Weight:  [151 kg (332 lb 14.3 oz)] 151 kg (332 lb 14.3 oz) (12/26 2355)   General appearance: alert, cooperative and no distress Incision/Wound: R wrist is dressed, R hip dressed as well. Both have clean dressings.   Lab Results  Basename 10/06/12 1240 10/06/12 0530 10/05/12 0420  WBC 10.2 10.4 --  HGB 9.3* 9.9* --  HCT 26.7* 29.0* --  NA -- 130* 131*  K -- 4.7 4.2  CL -- 95* 96  CO2 -- 27 29  BUN -- 22 19  CREATININE -- 0.85 0.99  GLU -- -- --   Liver Panel No results found for this basename: PROT:2,ALBUMIN:2,AST:2,ALT:2,ALKPHOS:2,BILITOT:2,BILIDIR:2,IBILI:2 in the last 72 hours Sedimentation Rate No results found for this basename: ESRSEDRATE in the last 72 hours C-Reactive Protein No results found for this basename: CRP:2 in the last 72 hours  Microbiology: Recent Results (from the past 240 hour(s))  URINE CULTURE     Status: Normal   Collection Time   09/29/12 12:21 AM      Component Value Range Status Comment   Specimen Description URINE, CLEAN CATCH   Final    Special Requests NONE   Final    Culture  Setup Time 09/29/2012 01:25   Final    Colony Count >=100,000 COLONIES/ML   Final    Culture ESCHERICHIA COLI   Final    Report Status 09/30/2012 FINAL   Final    Organism ID, Bacteria ESCHERICHIA COLI   Final   SURGICAL PCR SCREEN     Status: Normal   Collection Time   09/30/12  7:58 PM      Component Value Range Status Comment   MRSA, PCR NEGATIVE  NEGATIVE Final    Staphylococcus aureus NEGATIVE  NEGATIVE Final   MRSA PCR SCREENING     Status: Normal   Collection Time   10/02/12  3:25 PM      Component Value Range Status Comment   MRSA by PCR NEGATIVE  NEGATIVE Final   BODY FLUID CULTURE     Status: Normal   Collection Time   10/02/12  6:29 PM  Component Value Range Status Comment   Specimen Description FLUID RIGHT WRIST 1   Final    Special  Requests NONE   Final    Gram Stain     Final    Value: RARE WBC PRESENT, PREDOMINANTLY MONONUCLEAR     NO ORGANISMS SEEN   Culture     Final    Value: FEW ENTEROBACTER CLOACAE     Note: CRITICAL RESULT CALLED TO, READ BACK BY AND VERIFIED WITH: DANNA FRANKLIN@[id number]  ON [id number] BY [facility]   Report Status 10/05/2012 FINAL   Final    Organism ID, Bacteria ENTEROBACTER CLOACAE   Final   ANAEROBIC CULTURE     Status: Normal (Preliminary result)   Collection Time   10/02/12  6:29 PM      Component Value Range Status Comment   Specimen Description FLUID RIGHT WRIST 1   Final    Special Requests NONE   Final    Gram Stain     Final    Value: RARE WBC PRESENT, PREDOMINANTLY MONONUCLEAR     NO ORGANISMS SEEN   Culture     Final    Value: NO ANAEROBES ISOLATED; CULTURE IN PROGRESS FOR 5 DAYS   Report Status PENDING   Incomplete   TISSUE CULTURE     Status: Normal   Collection Time   10/02/12  6:31 PM      Component Value Range Status Comment   Specimen Description WRIST RIGHT 1   Final    Special Requests NONE   Final    Gram Stain     Final    Value: NO WBC SEEN     NO ORGANISMS SEEN   Culture FEW ENTEROBACTER CLOACAE   Final    Report Status 10/06/2012 FINAL   Final    Organism ID, Bacteria ENTEROBACTER CLOACAE   Final   ANAEROBIC CULTURE     Status: Normal (Preliminary result)   Collection Time   10/02/12  6:31 PM      Component Value Range Status Comment   Specimen Description WRIST RIGHT 1   Final    Special Requests NONE   Final    Gram Stain     Final    Value: NO WBC SEEN     NO ORGANISMS SEEN   Culture     Final    Value: NO ANAEROBES ISOLATED; CULTURE IN PROGRESS FOR 5 DAYS   Report Status PENDING   Incomplete   AFB CULTURE WITH SMEAR     Status: Normal (Preliminary result)   Collection Time   10/02/12  6:31 PM      Component Value Range Status Comment   Specimen Description WRIST RIGHT 1   Final    Special Requests NONE   Final    ACID FAST SMEAR NO ACID FAST BACILLI  SEEN   Final    Culture     Final    Value: CULTURE WILL BE EXAMINED FOR 6 WEEKS BEFORE ISSUING A FINAL REPORT   Report Status PENDING   Incomplete   FUNGUS CULTURE W SMEAR     Status: Normal (Preliminary result)   Collection Time   10/02/12  6:31 PM      Component Value Range Status Comment   Specimen Description WRIST RIGHT 1   Final    Special Requests NONE   Final    Fungal Smear NO YEAST OR FUNGAL ELEMENTS SEEN   Final    Culture CULTURE IN PROGRESS FOR FOUR WEEKS  Final    Report Status PENDING   Incomplete   BODY FLUID CULTURE     Status: Normal   Collection Time   10/02/12  6:48 PM      Component Value Range Status Comment   Specimen Description FLUID RIGHT WRIST 2   Final    Special Requests NONE   Final    Gram Stain     Final    Value: RARE WBC PRESENT,BOTH PMN AND MONONUCLEAR     NO ORGANISMS SEEN   Culture     Final    Value: FEW ENTEROBACTER CLOACAE     Note: SUSCEPTIBILITIES PERFORMED ON PREVIOUS CULTURE WITHIN THE LAST 5 DAYS. CRITICAL RESULT CALLED TO, READ BACK BY AND VERIFIED WITH: DANNA FRANKLIN@[email or web address]  ON [id number] BY [facility]   Report Status 10/05/2012 FINAL   Final   ANAEROBIC CULTURE     Status: Normal (Preliminary result)   Collection Time   10/02/12  6:48 PM      Component Value Range Status Comment   Specimen Description FLUID RIGHT WRIST 2   Final    Special Requests NONE   Final    Gram Stain     Final    Value: RARE WBC PRESENT,BOTH PMN AND MONONUCLEAR     NO ORGANISMS SEEN   Culture     Final    Value: NO ANAEROBES ISOLATED; CULTURE IN PROGRESS FOR 5 DAYS   Report Status PENDING   Incomplete   TISSUE CULTURE     Status: Normal   Collection Time   10/02/12  6:51 PM      Component Value Range Status Comment   Specimen Description WRIST RIGHT 2   Final    Special Requests NONE   Final    Gram Stain     Final    Value: NO WBC SEEN     NO ORGANISMS SEEN   Culture NO GROWTH 3 DAYS   Final    Report Status 10/06/2012 FINAL   Final   ANAEROBIC CULTURE      Status: Normal (Preliminary result)   Collection Time   10/02/12  6:51 PM      Component Value Range Status Comment   Specimen Description WRIST RIGHT 2   Final    Special Requests NONE   Final    Gram Stain     Final    Value: NO WBC SEEN     NO ORGANISMS SEEN   Culture     Final    Value: NO ANAEROBES ISOLATED; CULTURE IN PROGRESS FOR 5 DAYS   Report Status PENDING   Incomplete   AFB CULTURE WITH SMEAR     Status: Normal (Preliminary result)   Collection Time   10/02/12  6:51 PM      Component Value Range Status Comment   Specimen Description WRIST RIGHT 2   Final    Special Requests NONE   Final    ACID FAST SMEAR NO ACID FAST BACILLI SEEN   Final    Culture     Final    Value: CULTURE WILL BE EXAMINED FOR 6 WEEKS BEFORE ISSUING A FINAL REPORT   Report Status PENDING   Incomplete   BODY FLUID CULTURE     Status: Normal   Collection Time   10/02/12  7:34 PM      Component Value Range Status Comment   Specimen Description FLUID RIGHT WRIST 3   Final    Special Requests NONE   Final  Gram Stain     Final    Value: RARE WBC PRESENT,BOTH PMN AND MONONUCLEAR     NO ORGANISMS SEEN   Culture     Final    Value: ENTEROBACTER CLOACAE     Note: SUSCEPTIBILITIES PERFORMED ON PREVIOUS CULTURE WITHIN THE LAST 5 DAYS. CRITICAL RESULT CALLED TO, READ BACK BY AND VERIFIED WITH: DANNA FRANKLIN@[email or web address]  ON [id number] BY [facility]   Report Status 10/05/2012 FINAL   Final   ANAEROBIC CULTURE     Status: Normal (Preliminary result)   Collection Time   10/02/12  7:34 PM      Component Value Range Status Comment   Specimen Description FLUID RIGHT WRIST 3   Final    Special Requests NONE   Final    Gram Stain     Final    Value: RARE WBC PRESENT,BOTH PMN AND MONONUCLEAR     NO ORGANISMS SEEN   Culture     Final    Value: NO ANAEROBES ISOLATED; CULTURE IN PROGRESS FOR 5 DAYS   Report Status PENDING   Incomplete     Studies/Results: Dg Pelvis Portable  10/06/2012  *RADIOLOGY REPORT*  Clinical  Data: Postoperative radiograph, status post total right hip arthroplasty.  PORTABLE PELVIS  Comparison: Pelvis radiograph performed 09/28/2012  Findings: There has been successful placement of a total right hip arthroplasty, in expected position, without evidence of new fracture or loosening.  Prominent heterotopic bone formation is again noted about the comminuted fracture at the proximal femoral diaphysis and femoral trochanters.  Visualized cerclage wires appear grossly intact.  The left hip joint is unremarkable in appearance.  Mild sclerotic change is seen at the sacroiliac joints.  Mild degenerative change is noted at the lower lumbar spine.  The visualized bowel gas pattern is grossly unremarkable.  IMPRESSION: Right total hip arthroplasty is grossly unremarkable in appearance, without evidence of new fracture or loosening.  Prominent heterotopic bone formation again noted about the comminuted fracture at the proximal femoral diaphysis and femoral trochanters.   Original Report Authenticated By: Tonia Ghent, M.D.    Dg Hip Portable 1 View Right  10/05/2012  *RADIOLOGY REPORT*  Clinical Data: Right hip surgery  PORTABLE RIGHT HIP - 1 VIEW  Comparison: Earlier film of the same day  Findings: This second intraoperative radiograph shows placement of additional cerclage wires about the oblique fracture of the proximal femoral shaft, fragments in near anatomic alignment. Femoral and acetabular components of hip arthroplasty project in expected location.  Component of the knee arthroplasty is partially seen.  IMPRESSION: Right hip arthroplasty with cerclage fixation of proximal femoral shaft oblique fracture   Original Report Authenticated By: D. Andria Rhein, MD    Dg Hip Portable 1 View Right  10/05/2012  *RADIOLOGY REPORT*  Clinical Data: The hip replacement surgery  PORTABLE RIGHT HIP - 1 VIEW  Comparison: 09/28/2012  Findings: Single intraoperative image documents removal of the IM rod.  Acetabular  component hip arthroplasty projects in expected location.  Femoral component spacer is noted, incompletely visualized.  There is a cerclage wire around the mid femoral shaft.  IMPRESSION:  Removal of IM rod and changes of right hip replacement surgery.   Original Report Authenticated By: D. Andria Rhein, MD      Assessment/Plan: Infected R wrist, suspected osteomyelitis.  E coli UTI  Total days of antibiotics 4 Will simplify his anbx to ceftriaxone alone Plan for 42 days of therapy.  Will check his ESR and CRP Will check his HIV  Will have him f/u in ID clinic.         Johny Sax Infectious Diseases [phone] 10/06/2012, 3:29 PM   LOS: 8 days

## 2012-10-06 NOTE — Progress Notes (Signed)
ANTIBIOTIC CONSULT NOTE - FOLLOW UP  Pharmacy Consult for vancomycin Indication: r/o osteomyelitis R wrist   No Known Allergies  Patient Measurements: Height: 6\' 4"  (193 cm) Weight: 332 lb 14.3 oz (151 kg) IBW/kg (Calculated) : 86.8  Adjusted Body Weight:   Vital Signs: Temp: 98.7 F (37.1 C) (12/27 0400) Temp src: Axillary (12/27 0400) BP: 135/70 mmHg (12/27 0554) Pulse Rate: 84  (12/27 0554) Intake/Output from previous day: 12/26 0701 - 12/27 0700 In: 7650.3 [P.O.:690; I.V.:4715.3; Blood:1695; IV Piggyback:550] Out: 6025 [Urine:2825; Blood:3200] Intake/Output from this shift: Total I/O In: 6540 [P.O.:690; I.V.:3950; Blood:1400; IV Piggyback:500] Out: 5125 [Urine:1925; Blood:3200]  Labs:  [facility] 10/06/12 0530 10/05/12 0420 10/04/12 0425  WBC 10.4 6.7 7.5  HGB 9.9* 8.8* 10.0*  PLT 184 211 255  LABCREA -- -- --  CREATININE 0.85 0.99 0.92   Estimated Creatinine Clearance: 134.2 ml/min (by C-G formula based on Cr of 0.85).  Basename 10/06/12 0530  VANCOTROUGH 12.9  VANCOPEAK --  Drue Dun --  GENTTROUGH --  GENTPEAK --  GENTRANDOM --  TOBRATROUGH --  TOBRAPEAK --  TOBRARND --  AMIKACINPEAK --  AMIKACINTROU --  AMIKACIN --     Microbiology: Recent Results (from the past 720 hour(s))  URINE CULTURE     Status: Normal   Collection Time   09/29/12 12:21 AM      Component Value Range Status Comment   Specimen Description URINE, CLEAN CATCH   Final    Special Requests NONE   Final    Culture  Setup Time 09/29/2012 01:25   Final    Colony Count >=100,000 COLONIES/ML   Final    Culture ESCHERICHIA COLI   Final    Report Status 09/30/2012 FINAL   Final    Organism ID, Bacteria ESCHERICHIA COLI   Final   SURGICAL PCR SCREEN     Status: Normal   Collection Time   09/30/12  7:58 PM      Component Value Range Status Comment   MRSA, PCR NEGATIVE  NEGATIVE Final    Staphylococcus aureus NEGATIVE  NEGATIVE Final   MRSA PCR SCREENING     Status: Normal   Collection Time   10/02/12  3:25 PM      Component Value Range Status Comment   MRSA by PCR NEGATIVE  NEGATIVE Final   BODY FLUID CULTURE     Status: Normal   Collection Time   10/02/12  6:29 PM      Component Value Range Status Comment   Specimen Description FLUID RIGHT WRIST 1   Final    Special Requests NONE   Final    Gram Stain     Final    Value: RARE WBC PRESENT, PREDOMINANTLY MONONUCLEAR     NO ORGANISMS SEEN   Culture     Final    Value: FEW ENTEROBACTER CLOACAE     Note: CRITICAL RESULT CALLED TO, READ BACK BY AND VERIFIED WITH: DANNA FRANKLIN@0923  ON [id number] BY [facility]   Report Status 10/05/2012 FINAL   Final    Organism ID, Bacteria ENTEROBACTER CLOACAE   Final   ANAEROBIC CULTURE     Status: Normal (Preliminary result)   Collection Time   10/02/12  6:29 PM      Component Value Range Status Comment   Specimen Description FLUID RIGHT WRIST 1   Final    Special Requests NONE   Final    Gram Stain     Final    Value: RARE WBC PRESENT, PREDOMINANTLY  MONONUCLEAR     NO ORGANISMS SEEN   Culture     Final    Value: NO ANAEROBES ISOLATED; CULTURE IN PROGRESS FOR 5 DAYS   Report Status PENDING   Incomplete   TISSUE CULTURE     Status: Normal (Preliminary result)   Collection Time   10/02/12  6:31 PM      Component Value Range Status Comment   Specimen Description WRIST RIGHT 1   Final    Special Requests NONE   Final    Gram Stain     Final    Value: NO WBC SEEN     NO ORGANISMS SEEN   Culture FEW ENTEROBACTER CLOACAE   Final    Report Status PENDING   Incomplete    Organism ID, Bacteria ENTEROBACTER CLOACAE   Final   ANAEROBIC CULTURE     Status: Normal (Preliminary result)   Collection Time   10/02/12  6:31 PM      Component Value Range Status Comment   Specimen Description WRIST RIGHT 1   Final    Special Requests NONE   Final    Gram Stain     Final    Value: NO WBC SEEN     NO ORGANISMS SEEN   Culture     Final    Value: NO ANAEROBES ISOLATED; CULTURE IN  PROGRESS FOR 5 DAYS   Report Status PENDING   Incomplete   AFB CULTURE WITH SMEAR     Status: Normal (Preliminary result)   Collection Time   10/02/12  6:31 PM      Component Value Range Status Comment   Specimen Description WRIST RIGHT 1   Final    Special Requests NONE   Final    ACID FAST SMEAR NO ACID FAST BACILLI SEEN   Final    Culture     Final    Value: CULTURE WILL BE EXAMINED FOR 6 WEEKS BEFORE ISSUING A FINAL REPORT   Report Status PENDING   Incomplete   FUNGUS CULTURE W SMEAR     Status: Normal (Preliminary result)   Collection Time   10/02/12  6:31 PM      Component Value Range Status Comment   Specimen Description WRIST RIGHT 1   Final    Special Requests NONE   Final    Fungal Smear NO YEAST OR FUNGAL ELEMENTS SEEN   Final    Culture CULTURE IN PROGRESS FOR FOUR WEEKS   Final    Report Status PENDING   Incomplete   BODY FLUID CULTURE     Status: Normal   Collection Time   10/02/12  6:48 PM      Component Value Range Status Comment   Specimen Description FLUID RIGHT WRIST 2   Final    Special Requests NONE   Final    Gram Stain     Final    Value: RARE WBC PRESENT,BOTH PMN AND MONONUCLEAR     NO ORGANISMS SEEN   Culture     Final    Value: FEW ENTEROBACTER CLOACAE     Note: SUSCEPTIBILITIES PERFORMED ON PREVIOUS CULTURE WITHIN THE LAST 5 DAYS. CRITICAL RESULT CALLED TO, READ BACK BY AND VERIFIED WITH: DANNA FRANKLIN@0923  ON [id number] BY [facility]   Report Status 10/05/2012 FINAL   Final   ANAEROBIC CULTURE     Status: Normal (Preliminary result)   Collection Time   10/02/12  6:48 PM      Component Value Range Status Comment  Specimen Description FLUID RIGHT WRIST 2   Final    Special Requests NONE   Final    Gram Stain     Final    Value: RARE WBC PRESENT,BOTH PMN AND MONONUCLEAR     NO ORGANISMS SEEN   Culture     Final    Value: NO ANAEROBES ISOLATED; CULTURE IN PROGRESS FOR 5 DAYS   Report Status PENDING   Incomplete   TISSUE CULTURE     Status: Normal  (Preliminary result)   Collection Time   10/02/12  6:51 PM      Component Value Range Status Comment   Specimen Description WRIST RIGHT 2   Final    Special Requests NONE   Final    Gram Stain     Final    Value: NO WBC SEEN     NO ORGANISMS SEEN   Culture NO GROWTH 2 DAYS   Final    Report Status PENDING   Incomplete   ANAEROBIC CULTURE     Status: Normal (Preliminary result)   Collection Time   10/02/12  6:51 PM      Component Value Range Status Comment   Specimen Description WRIST RIGHT 2   Final    Special Requests NONE   Final    Gram Stain     Final    Value: NO WBC SEEN     NO ORGANISMS SEEN   Culture     Final    Value: NO ANAEROBES ISOLATED; CULTURE IN PROGRESS FOR 5 DAYS   Report Status PENDING   Incomplete   AFB CULTURE WITH SMEAR     Status: Normal (Preliminary result)   Collection Time   10/02/12  6:51 PM      Component Value Range Status Comment   Specimen Description WRIST RIGHT 2   Final    Special Requests NONE   Final    ACID FAST SMEAR NO ACID FAST BACILLI SEEN   Final    Culture     Final    Value: CULTURE WILL BE EXAMINED FOR 6 WEEKS BEFORE ISSUING A FINAL REPORT   Report Status PENDING   Incomplete   BODY FLUID CULTURE     Status: Normal   Collection Time   10/02/12  7:34 PM      Component Value Range Status Comment   Specimen Description FLUID RIGHT WRIST 3   Final    Special Requests NONE   Final    Gram Stain     Final    Value: RARE WBC PRESENT,BOTH PMN AND MONONUCLEAR     NO ORGANISMS SEEN   Culture     Final    Value: ENTEROBACTER CLOACAE     Note: SUSCEPTIBILITIES PERFORMED ON PREVIOUS CULTURE WITHIN THE LAST 5 DAYS. CRITICAL RESULT CALLED TO, READ BACK BY AND VERIFIED WITH: DANNA FRANKLIN@0923  ON [id number] BY [facility]   Report Status 10/05/2012 FINAL   Final   ANAEROBIC CULTURE     Status: Normal (Preliminary result)   Collection Time   10/02/12  7:34 PM      Component Value Range Status Comment   Specimen Description FLUID RIGHT WRIST 3    Final    Special Requests NONE   Final    Gram Stain     Final    Value: RARE WBC PRESENT,BOTH PMN AND MONONUCLEAR     NO ORGANISMS SEEN   Culture     Final    Value: NO ANAEROBES ISOLATED;  CULTURE IN PROGRESS FOR 5 DAYS   Report Status PENDING   Incomplete     Anti-infectives     Start     Dose/Rate Route Frequency Ordered Stop   10/06/12 1800   vancomycin (VANCOCIN) 1,500 mg in sodium chloride 0.9 % 500 mL IVPB        1,500 mg 250 mL/hr over 120 Minutes Intravenous Every 12 hours 10/06/12 0644     10/04/12 1800   vancomycin (VANCOCIN) 1,250 mg in sodium chloride 0.9 % 250 mL IVPB  Status:  Discontinued        1,250 mg 166.7 mL/hr over 90 Minutes Intravenous Every 12 hours 10/04/12 1040 10/06/12 0645   10/04/12 0600   vancomycin (VANCOCIN) 1,500 mg in sodium chloride 0.9 % 500 mL IVPB  Status:  Discontinued        1,500 mg 250 mL/hr over 120 Minutes Intravenous Every 12 hours 10/03/12 1451 10/04/12 1040   10/03/12 1600   vancomycin (VANCOCIN) 2,500 mg in sodium chloride 0.9 % 500 mL IVPB        2,500 mg 250 mL/hr over 120 Minutes Intravenous  Once 10/03/12 1451 10/03/12 1849   10/03/12 1500   ceFEPIme (MAXIPIME) 2 g in dextrose 5 % 50 mL IVPB        2 g 100 mL/hr over 30 Minutes Intravenous Every 12 hours 10/03/12 1408     10/03/12 0200   ceFAZolin (ANCEF) IVPB 2 g/50 mL premix  Status:  Discontinued        2 g 100 mL/hr over 30 Minutes Intravenous Every 8 hours 10/02/12 2216 10/03/12 1408   10/02/12 2030   tobramycin (NEBCIN) powder  Status:  Discontinued          As needed 10/02/12 2217 10/02/12 2335   10/02/12 2030   vancomycin (VANCOCIN) powder  Status:  Discontinued          As needed 10/02/12 2218 10/02/12 2335   09/29/12 2200   doxycycline (VIBRA-TABS) tablet 100 mg        100 mg Oral Daily at bedtime 09/29/12 0311     09/29/12 1000   cefTRIAXone (ROCEPHIN) 1 g in dextrose 5 % 50 mL IVPB  Status:  Discontinued        1 g 100 mL/hr over 30 Minutes Intravenous  Every 24 hours 09/29/12 0854 10/01/12 1053          Assessment: Patient with low vancomycin level.  Aware of only partial dose last am (~1/2 infused).  While will have some effect on level, feel not enough to get to goal with current dose and will increase dose.  Goal of Therapy:  Vancomycin trough level 15-20 mcg/ml  Plan:  Measure antibiotic drug levels at steady state Follow up culture results Increase to 1500mg  iv q12hr vancomycin  Aleene Davidson Crowford 10/06/2012,6:47 AM

## 2012-10-06 NOTE — Progress Notes (Signed)
Patient has home CPAP machine in room. Full face mask. RT checked with patient who stated that he did not require any assistance with machine. RT encouraged patient to call in the event that he should have any problems or questions.

## 2012-10-06 NOTE — Progress Notes (Signed)
   Subjective: 1 Day Post-Op Procedure(s) (LRB): TOTAL HIP REVISION (Right) HARDWARE REMOVAL (Right)   Patient reports pain as mild, pain well controlled. Doing well this morning. No events throughout the night.    Objective:   VITALS:   Filed Vitals:   10/06/12  BP: 150/62  Pulse: 85  Temp: 98.7 F (37.1 C)   Resp: 15    Neurovascular intact Dorsiflexion/Plantar flexion intact Incision: scant drainage No cellulitis present Compartment soft  LABS  Basename 10/06/12 0530 10/05/12 0420 10/04/12 0425  HGB 9.9* 8.8* 10.0*  HCT 29.0* 27.6* 30.8*  WBC 10.4 6.7 7.5  PLT 184 211 255     Basename 10/06/12 0530 10/05/12 0420 10/04/12 0425  NA 130* 131* 133*  K 4.7 4.2 4.2  BUN 22 19 15   CREATININE 0.85 0.99 1.61  GLUCOSE 185* 129* 127*     Assessment/Plan: 1 Day Post-Op Procedure(s) (LRB): TOTAL HIP REVISION (Right) HARDWARE REMOVAL (Right)  Advance diet Up with therapy 50% weight bearing on both the right leg and right arm. If good with medicine, he appears to be stable to transfer from stepdown to the 6th floor.   Anastasio Auerbach Nyelli Samara   PAC  10/06/2012, 8:14 AM

## 2012-10-07 LAB — ANAEROBIC CULTURE: Gram Stain: NONE SEEN

## 2012-10-07 LAB — CBC
MCH: 27.9 pg (ref 26.0–34.0)
MCHC: 33.3 g/dL (ref 30.0–36.0)
Platelets: 170 10*3/uL (ref 150–400)
RBC: 3.12 MIL/uL — ABNORMAL LOW (ref 4.22–5.81)
RDW: 16.7 % — ABNORMAL HIGH (ref 11.5–15.5)

## 2012-10-07 LAB — BASIC METABOLIC PANEL
CO2: 28 mEq/L (ref 19–32)
Calcium: 8.1 mg/dL — ABNORMAL LOW (ref 8.4–10.5)
GFR calc Af Amer: >90 mL/min (ref >90)
GFR calc non Af Amer: 90 mL/min — ABNORMAL LOW (ref >90)
Sodium: 130 mEq/L — ABNORMAL LOW (ref 135–145)

## 2012-10-07 LAB — SEDIMENTATION RATE: Sed Rate: 50 mm/hr — ABNORMAL HIGH (ref 0–16)

## 2012-10-07 LAB — C-REACTIVE PROTEIN: CRP: 9.3 mg/dL — ABNORMAL HIGH (ref <0.60)

## 2012-10-07 MED ORDER — CLONAZEPAM 1 MG PO TABS
2.0000 mg | ORAL_TABLET | Freq: Three times a day (TID) | ORAL | Status: DC
  Administered 2012-10-07 – 2012-10-10 (×10): 2 mg via ORAL
  Filled 2012-10-07: qty 1
  Filled 2012-10-07 (×6): qty 2
  Filled 2012-10-07: qty 1
  Filled 2012-10-07 (×3): qty 2

## 2012-10-07 NOTE — Progress Notes (Signed)
INFECTIOUS DISEASE PROGRESS NOTE  ID: KONSTANTINOS CORDOBA is a 66 y.o. male with Principal Problem:  *Right femoral fracture Active Problems:  Right forearm fracture  Atrial fibrillation  Hypothyroidism  History of recurrent UTIs  Normocytic anemia  Low testosterone, possible hypogonadism  HTN (hypertension)  Psoriasis  Dyslipidemia  Vitamin D deficiency  GERD (gastroesophageal reflux disease)  Hyperglycemia  Anxiety  Depression  CAD (coronary artery disease)  DJD (degenerative joint disease)  Chronic gingivitis  Anemia  Subjective: Without complaints  Abtx:  Anti-infectives     Start     Dose/Rate Route Frequency Ordered Stop   10/06/12 1800   vancomycin (VANCOCIN) 1,500 mg in sodium chloride 0.9 % 500 mL IVPB  Status:  Discontinued        1,500 mg 250 mL/hr over 120 Minutes Intravenous Every 12 hours 10/06/12 0644 10/06/12 1546   10/06/12 1700   cefTRIAXone (ROCEPHIN) 2 g in dextrose 5 % 50 mL IVPB        2 g 100 mL/hr over 30 Minutes Intravenous Every 24 hours 10/06/12 1546 11/13/12 1659   10/04/12 1800   vancomycin (VANCOCIN) 1,250 mg in sodium chloride 0.9 % 250 mL IVPB  Status:  Discontinued        1,250 mg 166.7 mL/hr over 90 Minutes Intravenous Every 12 hours 10/04/12 1040 10/06/12 0645   10/04/12 0600   vancomycin (VANCOCIN) 1,500 mg in sodium chloride 0.9 % 500 mL IVPB  Status:  Discontinued        1,500 mg 250 mL/hr over 120 Minutes Intravenous Every 12 hours 10/03/12 1451 10/04/12 1040   10/03/12 1600   vancomycin (VANCOCIN) 2,500 mg in sodium chloride 0.9 % 500 mL IVPB        2,500 mg 250 mL/hr over 120 Minutes Intravenous  Once 10/03/12 1451 10/03/12 1849   10/03/12 1500   ceFEPIme (MAXIPIME) 2 g in dextrose 5 % 50 mL IVPB  Status:  Discontinued        2 g 100 mL/hr over 30 Minutes Intravenous Every 12 hours 10/03/12 1408 10/06/12 1546   10/03/12 0200   ceFAZolin (ANCEF) IVPB 2 g/50 mL premix  Status:  Discontinued        2 g 100 mL/hr over 30  Minutes Intravenous Every 8 hours 10/02/12 2216 10/03/12 1408   10/02/12 2030   tobramycin (NEBCIN) powder  Status:  Discontinued          As needed 10/02/12 2217 10/02/12 2335   10/02/12 2030   vancomycin (VANCOCIN) powder  Status:  Discontinued          As needed 10/02/12 2218 10/02/12 2335   09/29/12 2200   doxycycline (VIBRA-TABS) tablet 100 mg        100 mg Oral Daily at bedtime 09/29/12 0311     09/29/12 1000   cefTRIAXone (ROCEPHIN) 1 g in dextrose 5 % 50 mL IVPB  Status:  Discontinued        1 g 100 mL/hr over 30 Minutes Intravenous Every 24 hours 09/29/12 0854 10/01/12 1053          Medications:  Scheduled:   . amiodarone  100 mg Oral Daily  . atorvastatin  80 mg Oral Daily  . calcium chloride  1 g Intravenous Once  . calcium chloride  1 g Intravenous Once  . cefTRIAXone (ROCEPHIN)  IV  2 g Intravenous Q24H  . cholecalciferol  2,000 Units Oral Daily  . clonazePAM  2 mg Oral TID  .  diltiazem  180 mg Oral Daily  . docusate sodium  100 mg Oral BID  . doxycycline  100 mg Oral QHS  . DULoxetine  60 mg Oral Daily  . ferrous sulfate  325 mg Oral TID PC  . levothyroxine  200 mcg Oral QAC breakfast  . multivitamin with minerals  1 tablet Oral Daily  . pantoprazole  40 mg Oral Daily  . polyethylene glycol  17 g Oral Daily  . ranolazine  500 mg Oral BID  . Rivaroxaban  20 mg Oral Q supper  . sodium chloride  3 mL Intravenous Q12H  . Tamsulosin HCl  0.4 mg Oral Daily    Objective: Vital signs in last 24 hours: Temp:  [97.7 F (36.5 C)-98.3 F (36.8 C)] 98.1 F (36.7 C) (12/28 1009) Pulse Rate:  [92-103] 95  (12/28 1009) Resp:  [16-20] 18  (12/28 1200) BP: (149-173)/(68-73) 149/68 mmHg (12/28 1009) SpO2:  [93 %-98 %] 98 % (12/28 1200)   General appearance: alert, cooperative and no distress R wrist is wrapped. LUE PIC is clean.   Lab Results  Basename 10/07/12 0558 10/06/12 1240 10/06/12 0530  WBC 9.6 10.2 --  HGB 8.7* 9.3* --  HCT 26.1* 26.7* --  NA 130*  -- 130*  K 3.9 -- 4.7  CL 97 -- 95*  CO2 28 -- 27  BUN 25* -- 22  CREATININE 0.81 -- 0.85  GLU -- -- --   Liver Panel No results found for this basename: PROT:2,ALBUMIN:2,AST:2,ALT:2,ALKPHOS:2,BILITOT:2,BILIDIR:2,IBILI:2 in the last 72 hours Sedimentation Rate No results found for this basename: ESRSEDRATE in the last 72 hours C-Reactive Protein No results found for this basename: CRP:2 in the last 72 hours  Microbiology: Recent Results (from the past 240 hour(s))  URINE CULTURE     Status: Normal   Collection Time   09/29/12 12:21 AM      Component Value Range Status Comment   Specimen Description URINE, CLEAN CATCH   Final    Special Requests NONE   Final    Culture  Setup Time 09/29/2012 01:25   Final    Colony Count >=100,000 COLONIES/ML   Final    Culture ESCHERICHIA COLI   Final    Report Status 09/30/2012 FINAL   Final    Organism ID, Bacteria ESCHERICHIA COLI   Final   SURGICAL PCR SCREEN     Status: Normal   Collection Time   09/30/12  7:58 PM      Component Value Range Status Comment   MRSA, PCR NEGATIVE  NEGATIVE Final    Staphylococcus aureus NEGATIVE  NEGATIVE Final   MRSA PCR SCREENING     Status: Normal   Collection Time   10/02/12  3:25 PM      Component Value Range Status Comment   MRSA by PCR NEGATIVE  NEGATIVE Final   BODY FLUID CULTURE     Status: Normal   Collection Time   10/02/12  6:29 PM      Component Value Range Status Comment   Specimen Description FLUID RIGHT WRIST 1   Final    Special Requests NONE   Final    Gram Stain     Final    Value: RARE WBC PRESENT, PREDOMINANTLY MONONUCLEAR     NO ORGANISMS SEEN   Culture     Final    Value: FEW ENTEROBACTER CLOACAE     Note: CRITICAL RESULT CALLED TO, READ BACK BY AND VERIFIED WITH: DANNA FRANKLIN@[email or web address]  ON [id number] BY [facility]  Report Status 10/05/2012 FINAL   Final    Organism ID, Bacteria ENTEROBACTER CLOACAE   Final   ANAEROBIC CULTURE     Status: Normal   Collection Time   10/02/12  6:29 PM        Component Value Range Status Comment   Specimen Description FLUID RIGHT WRIST 1   Final    Special Requests NONE   Final    Gram Stain     Final    Value: RARE WBC PRESENT, PREDOMINANTLY MONONUCLEAR     NO ORGANISMS SEEN   Culture NO ANAEROBES ISOLATED   Final    Report Status 10/07/2012 FINAL   Final   TISSUE CULTURE     Status: Normal   Collection Time   10/02/12  6:31 PM      Component Value Range Status Comment   Specimen Description WRIST RIGHT 1   Final    Special Requests NONE   Final    Gram Stain     Final    Value: NO WBC SEEN     NO ORGANISMS SEEN   Culture FEW ENTEROBACTER CLOACAE   Final    Report Status 10/06/2012 FINAL   Final    Organism ID, Bacteria ENTEROBACTER CLOACAE   Final   ANAEROBIC CULTURE     Status: Normal   Collection Time   10/02/12  6:31 PM      Component Value Range Status Comment   Specimen Description WRIST RIGHT 1   Final    Special Requests NONE   Final    Gram Stain     Final    Value: NO WBC SEEN     NO ORGANISMS SEEN   Culture NO ANAEROBES ISOLATED   Final    Report Status 10/07/2012 FINAL   Final   AFB CULTURE WITH SMEAR     Status: Normal (Preliminary result)   Collection Time   10/02/12  6:31 PM      Component Value Range Status Comment   Specimen Description WRIST RIGHT 1   Final    Special Requests NONE   Final    ACID FAST SMEAR NO ACID FAST BACILLI SEEN   Final    Culture     Final    Value: CULTURE WILL BE EXAMINED FOR 6 WEEKS BEFORE ISSUING A FINAL REPORT   Report Status PENDING   Incomplete   FUNGUS CULTURE W SMEAR     Status: Normal (Preliminary result)   Collection Time   10/02/12  6:31 PM      Component Value Range Status Comment   Specimen Description WRIST RIGHT 1   Final    Special Requests NONE   Final    Fungal Smear NO YEAST OR FUNGAL ELEMENTS SEEN   Final    Culture CULTURE IN PROGRESS FOR FOUR WEEKS   Final    Report Status PENDING   Incomplete   BODY FLUID CULTURE     Status: Normal   Collection Time    10/02/12  6:48 PM      Component Value Range Status Comment   Specimen Description FLUID RIGHT WRIST 2   Final    Special Requests NONE   Final    Gram Stain     Final    Value: RARE WBC PRESENT,BOTH PMN AND MONONUCLEAR     NO ORGANISMS SEEN   Culture     Final    Value: FEW ENTEROBACTER CLOACAE     Note: SUSCEPTIBILITIES PERFORMED  ON PREVIOUS CULTURE WITHIN THE LAST 5 DAYS. CRITICAL RESULT CALLED TO, READ BACK BY AND VERIFIED WITH: DANNA FRANKLIN@[email or web address]  ON [id number] BY [facility]   Report Status 10/05/2012 FINAL   Final   ANAEROBIC CULTURE     Status: Normal   Collection Time   10/02/12  6:48 PM      Component Value Range Status Comment   Specimen Description FLUID RIGHT WRIST 2   Final    Special Requests NONE   Final    Gram Stain     Final    Value: RARE WBC PRESENT,BOTH PMN AND MONONUCLEAR     NO ORGANISMS SEEN   Culture NO ANAEROBES ISOLATED   Final    Report Status 10/07/2012 FINAL   Final   TISSUE CULTURE     Status: Normal   Collection Time   10/02/12  6:51 PM      Component Value Range Status Comment   Specimen Description WRIST RIGHT 2   Final    Special Requests NONE   Final    Gram Stain     Final    Value: NO WBC SEEN     NO ORGANISMS SEEN   Culture NO GROWTH 3 DAYS   Final    Report Status 10/06/2012 FINAL   Final   ANAEROBIC CULTURE     Status: Normal   Collection Time   10/02/12  6:51 PM      Component Value Range Status Comment   Specimen Description WRIST RIGHT 2   Final    Special Requests NONE   Final    Gram Stain     Final    Value: NO WBC SEEN     NO ORGANISMS SEEN   Culture NO ANAEROBES ISOLATED   Final    Report Status 10/07/2012 FINAL   Final   AFB CULTURE WITH SMEAR     Status: Normal (Preliminary result)   Collection Time   10/02/12  6:51 PM      Component Value Range Status Comment   Specimen Description WRIST RIGHT 2   Final    Special Requests NONE   Final    ACID FAST SMEAR NO ACID FAST BACILLI SEEN   Final    Culture     Final    Value:  CULTURE WILL BE EXAMINED FOR 6 WEEKS BEFORE ISSUING A FINAL REPORT   Report Status PENDING   Incomplete   BODY FLUID CULTURE     Status: Normal   Collection Time   10/02/12  7:34 PM      Component Value Range Status Comment   Specimen Description FLUID RIGHT WRIST 3   Final    Special Requests NONE   Final    Gram Stain     Final    Value: RARE WBC PRESENT,BOTH PMN AND MONONUCLEAR     NO ORGANISMS SEEN   Culture     Final    Value: ENTEROBACTER CLOACAE     Note: SUSCEPTIBILITIES PERFORMED ON PREVIOUS CULTURE WITHIN THE LAST 5 DAYS. CRITICAL RESULT CALLED TO, READ BACK BY AND VERIFIED WITH: DANNA FRANKLIN@[email or web address]  ON [id number] BY [facility]   Report Status 10/05/2012 FINAL   Final   ANAEROBIC CULTURE     Status: Normal   Collection Time   10/02/12  7:34 PM      Component Value Range Status Comment   Specimen Description FLUID RIGHT WRIST 3   Final    Special Requests NONE   Final  Gram Stain     Final    Value: RARE WBC PRESENT,BOTH PMN AND MONONUCLEAR     NO ORGANISMS SEEN   Culture NO ANAEROBES ISOLATED   Final    Report Status 10/07/2012 FINAL   Final     Studies/Results: Dg Pelvis Portable  10/06/2012  *RADIOLOGY REPORT*  Clinical Data: Postoperative radiograph, status post total right hip arthroplasty.  PORTABLE PELVIS  Comparison: Pelvis radiograph performed 09/28/2012  Findings: There has been successful placement of a total right hip arthroplasty, in expected position, without evidence of new fracture or loosening.  Prominent heterotopic bone formation is again noted about the comminuted fracture at the proximal femoral diaphysis and femoral trochanters.  Visualized cerclage wires appear grossly intact.  The left hip joint is unremarkable in appearance.  Mild sclerotic change is seen at the sacroiliac joints.  Mild degenerative change is noted at the lower lumbar spine.  The visualized bowel gas pattern is grossly unremarkable.  IMPRESSION: Right total hip arthroplasty is grossly  unremarkable in appearance, without evidence of new fracture or loosening.  Prominent heterotopic bone formation again noted about the comminuted fracture at the proximal femoral diaphysis and femoral trochanters.   Original Report Authenticated By: Tonia Ghent, M.D.    Dg Hip Portable 1 View Right  10/05/2012  *RADIOLOGY REPORT*  Clinical Data: Right hip surgery  PORTABLE RIGHT HIP - 1 VIEW  Comparison: Earlier film of the same day  Findings: This second intraoperative radiograph shows placement of additional cerclage wires about the oblique fracture of the proximal femoral shaft, fragments in near anatomic alignment. Femoral and acetabular components of hip arthroplasty project in expected location.  Component of the knee arthroplasty is partially seen.  IMPRESSION: Right hip arthroplasty with cerclage fixation of proximal femoral shaft oblique fracture   Original Report Authenticated By: D. Andria Rhein, MD    Dg Hip Portable 1 View Right  10/05/2012  *RADIOLOGY REPORT*  Clinical Data: The hip replacement surgery  PORTABLE RIGHT HIP - 1 VIEW  Comparison: 09/28/2012  Findings: Single intraoperative image documents removal of the IM rod.  Acetabular component hip arthroplasty projects in expected location.  Femoral component spacer is noted, incompletely visualized.  There is a cerclage wire around the mid femoral shaft.  IMPRESSION:  Removal of IM rod and changes of right hip replacement surgery.   Original Report Authenticated By: D. Andria Rhein, MD      Assessment/Plan: Infected R wrist (E cloacae), suspected osteomyelitis.  E coli UTI   Total days of antibiotics 5 (ceftriaxone)  Plan for 42 days of therapy.  Will check his ESR and CRP for f/u  Does not need isolation, his organisms are not ESBL, his MRSA PCR was (-).  HIV (-) Please have him f/u in ID clinic.  available if questions  Johny Sax Infectious Diseases [phone] 10/07/2012, 3:15 PM   LOS: 9 days

## 2012-10-07 NOTE — Progress Notes (Signed)
[facility] PROGRESS NOTE  RAYSHAUN NEEDLE GEX:[id number] DOB: 1945-10-20 DOA: 09/28/2012 PCP: Ebbie Ridge, MD PCP - Dr. Thomasena Edis in [place]. Orthopedics--Dr. Amanda Pea  Assessment/Plan: Right femoral fracture s/p mechanical fall Conversion of failed right total hip surgery to a right total hip replacement on 10/05/2012. Up with therapy 50% weight bearing on both the right leg and right arm. Wound vac discontinued on 10/07/2012. Continue PT.  Right forearm fracture and wound with hardware failure and nonunion of right radius and ulna.  Patient had removal of hardware, irrigation and debridement on 10/02/2012.  Currently on vancomycin and cefepime on 10/03/2012 by ID.  Antibiotic management per ID, Dr. Campbell/Dr. Ninetta Lights.  Wound cultures from 10/02/2012 results as indicated below. Will need grafting the defect at a later date.  Enterobacter cloaca infection of right wrist Transitioned from vancomycin and cefepime to ceftriaxone.  Sensitive to cefepime, ceftazidime, ceftriaxone, ciprofloxacin, gentamicin, Zosyn, Bactrim and tobramycin.  Further management as per ID.  PICC line placed on 10/04/2012.  Will need 42 days of antibiotics from 10/06/2012.  Normocytic anemia Stable.  Blood loss anemia from surgery.  S/p 6 units of pRBC transfusion on 10/05/2012.   E. Coli UTI Completed Rocephin 12/20-12/22.  Atrial fibrillation Stable, in SR on EKG on admission and telemetry. Restarted back on Xarelto. Was on Lovenox until 10/05/2012. Continue diltiazem, amiodarone.  HTN Stable.  Continue diltiazem.  Hypothyroidism TSH mildly high at admission. Continue levothyroxine, defer to primary care physician for making any adjustments.  Obstructive sleep apnea Continue CPAP.  Hyponatremia Etiology unclear.  Likely related to pain.  Continue to monitor.  Appears to be chronic.  Prophylaxis Rivaroxaban.  Code Status: Full code Family Communication: No family at bedside. Disposition  Plan: SNF, possibly on 10/09/2012?  Brief narrative: 66 year old male with history of hypertension, atrial fibrillation, hyperlipidemia, hypothyroidism was brought to the ER patient had a fall at his house and experienced right hip pain. Patient has had a fracture of the same area in August this year and had undergone surgery. X-rays reveal periprosthetic fracture. Patient states that he slipped and fell did not lose consciousness. Denies any chest pain or shortness of breath. Patient has been admitted for further management. Orthopedics on call was consulted.  Consultants:  Orthopedics Dr. Amanda Pea and Dr. Charlann Boxer  ID, Dr. Campbell/Dr. Ninetta Lights  PT--SNF  Procedures:  Removal of hardware, irrigation and debridement of the right forearm on 10/02/2012.  Antibiotics:  Rocephin 12/20 >> 12/22  Doxycycline on prior to admission.  Cefazolin 10/03/2012 >> 10/03/2012  Vancomycin 10/03/2012 >> 10/06/2012  Cefepime 10/03/2012 >> 10/06/2012  Ceftriaxone 10/06/2012 >> (42 more days from 10/06/2012)  HPI/Subjective: No specific concerns.  Worked with physical therapy yesterday.  Objective: Filed Vitals:   10/07/12 0000 10/07/12 0400 10/07/12 0642 10/07/12 1009  BP:   173/73 149/68  Pulse:   92 95  Temp:   97.7 F (36.5 C) 98.1 F (36.7 C)  TempSrc:    Oral  Resp: 17 17 16 18   Height:      Weight:      SpO2: 94% 95% 96% 96%    Intake/Output Summary (Last 24 hours) at 10/07/12 1022 Last data filed at 10/07/12 0643  Gross per 24 hour  Intake 1166.33 ml  Output   1750 ml  Net -583.67 ml   Filed Weights   09/29/12 0300 10/05/12 2355  Weight: 145.151 kg (320 lb) 151 kg (332 lb 14.3 oz)    Exam: Physical Exam: General: Awake, Oriented, No acute distress. HEENT: EOMI.  Neck: Supple CV: S1 and S2 Lungs: Clear to ascultation bilaterally Abdomen: Soft, Nontender, Nondistended, +bowel sounds. Ext: Good pulses. Trace edema.  Right upper extremity in bandages.   Data  Reviewed: Basic Metabolic Panel:  Lab 10/07/12 9604 10/06/12 0530 10/05/12 0420 10/04/12 0425 10/03/12 0438  NA 130* 130* 131* 133* 128*  K 3.9 4.7 4.2 4.2 4.6  CL 97 95* 96 97 94*  CO2 28 27 29 27 26   GLUCOSE 148* 185* 129* 127* 151*  BUN 25* 22 19 15 17   CREATININE 0.81 0.85 0.99 0.92 0.84  CALCIUM 8.1* 8.8 8.7 8.8 8.9  MG -- -- -- -- --  PHOS -- -- -- -- --   Liver Function Tests: No results found for this basename: AST:5,ALT:5,ALKPHOS:5,BILITOT:5,PROT:5,ALBUMIN:5 in the last 168 hours CBC:  Lab 10/07/12 0558 10/06/12 1240 10/06/12 0530 10/05/12 0420 10/04/12 0425  WBC 9.6 10.2 10.4 6.7 7.5  NEUTROABS -- -- -- -- --  HGB 8.7* 9.3* 9.9* 8.8* 10.0*  HCT 26.1* 26.7* 29.0* 27.6* 30.8*  MCV 83.7 81.9 81.2 84.4 83.9  PLT 170 186 184 211 255    Recent Results (from the past 240 hour(s))  URINE CULTURE     Status: Normal   Collection Time   09/29/12 12:21 AM      Component Value Range Status Comment   Specimen Description URINE, CLEAN CATCH   Final    Special Requests NONE   Final    Culture  Setup Time 09/29/2012 01:25   Final    Colony Count >=100,000 COLONIES/ML   Final    Culture ESCHERICHIA COLI   Final    Report Status 09/30/2012 FINAL   Final    Organism ID, Bacteria ESCHERICHIA COLI   Final   SURGICAL PCR SCREEN     Status: Normal   Collection Time   09/30/12  7:58 PM      Component Value Range Status Comment   MRSA, PCR NEGATIVE  NEGATIVE Final    Staphylococcus aureus NEGATIVE  NEGATIVE Final   MRSA PCR SCREENING     Status: Normal   Collection Time   10/02/12  3:25 PM      Component Value Range Status Comment   MRSA by PCR NEGATIVE  NEGATIVE Final   BODY FLUID CULTURE     Status: Normal   Collection Time   10/02/12  6:29 PM      Component Value Range Status Comment   Specimen Description FLUID RIGHT WRIST 1   Final    Special Requests NONE   Final    Gram Stain     Final    Value: RARE WBC PRESENT, PREDOMINANTLY MONONUCLEAR     NO ORGANISMS SEEN    Culture     Final    Value: FEW ENTEROBACTER CLOACAE     Note: CRITICAL RESULT CALLED TO, READ BACK BY AND VERIFIED WITH: DANNA FRANKLIN@0923  ON [id number] BY [facility]   Report Status 10/05/2012 FINAL   Final    Organism ID, Bacteria ENTEROBACTER CLOACAE   Final   ANAEROBIC CULTURE     Status: Normal   Collection Time   10/02/12  6:29 PM      Component Value Range Status Comment   Specimen Description FLUID RIGHT WRIST 1   Final    Special Requests NONE   Final    Gram Stain     Final    Value: RARE WBC PRESENT, PREDOMINANTLY MONONUCLEAR     NO ORGANISMS SEEN   Culture  NO ANAEROBES ISOLATED   Final    Report Status 10/07/2012 FINAL   Final   TISSUE CULTURE     Status: Normal   Collection Time   10/02/12  6:31 PM      Component Value Range Status Comment   Specimen Description WRIST RIGHT 1   Final    Special Requests NONE   Final    Gram Stain     Final    Value: NO WBC SEEN     NO ORGANISMS SEEN   Culture FEW ENTEROBACTER CLOACAE   Final    Report Status 10/06/2012 FINAL   Final    Organism ID, Bacteria ENTEROBACTER CLOACAE   Final   ANAEROBIC CULTURE     Status: Normal   Collection Time   10/02/12  6:31 PM      Component Value Range Status Comment   Specimen Description WRIST RIGHT 1   Final    Special Requests NONE   Final    Gram Stain     Final    Value: NO WBC SEEN     NO ORGANISMS SEEN   Culture NO ANAEROBES ISOLATED   Final    Report Status 10/07/2012 FINAL   Final   AFB CULTURE WITH SMEAR     Status: Normal (Preliminary result)   Collection Time   10/02/12  6:31 PM      Component Value Range Status Comment   Specimen Description WRIST RIGHT 1   Final    Special Requests NONE   Final    ACID FAST SMEAR NO ACID FAST BACILLI SEEN   Final    Culture     Final    Value: CULTURE WILL BE EXAMINED FOR 6 WEEKS BEFORE ISSUING A FINAL REPORT   Report Status PENDING   Incomplete   FUNGUS CULTURE W SMEAR     Status: Normal (Preliminary result)   Collection Time   10/02/12   6:31 PM      Component Value Range Status Comment   Specimen Description WRIST RIGHT 1   Final    Special Requests NONE   Final    Fungal Smear NO YEAST OR FUNGAL ELEMENTS SEEN   Final    Culture CULTURE IN PROGRESS FOR FOUR WEEKS   Final    Report Status PENDING   Incomplete   BODY FLUID CULTURE     Status: Normal   Collection Time   10/02/12  6:48 PM      Component Value Range Status Comment   Specimen Description FLUID RIGHT WRIST 2   Final    Special Requests NONE   Final    Gram Stain     Final    Value: RARE WBC PRESENT,BOTH PMN AND MONONUCLEAR     NO ORGANISMS SEEN   Culture     Final    Value: FEW ENTEROBACTER CLOACAE     Note: SUSCEPTIBILITIES PERFORMED ON PREVIOUS CULTURE WITHIN THE LAST 5 DAYS. CRITICAL RESULT CALLED TO, READ BACK BY AND VERIFIED WITH: DANNA FRANKLIN@0923  ON [id number] BY [facility]   Report Status 10/05/2012 FINAL   Final   ANAEROBIC CULTURE     Status: Normal   Collection Time   10/02/12  6:48 PM      Component Value Range Status Comment   Specimen Description FLUID RIGHT WRIST 2   Final    Special Requests NONE   Final    Gram Stain     Final    Value: RARE WBC  PRESENT,BOTH PMN AND MONONUCLEAR     NO ORGANISMS SEEN   Culture NO ANAEROBES ISOLATED   Final    Report Status 10/07/2012 FINAL   Final   TISSUE CULTURE     Status: Normal   Collection Time   10/02/12  6:51 PM      Component Value Range Status Comment   Specimen Description WRIST RIGHT 2   Final    Special Requests NONE   Final    Gram Stain     Final    Value: NO WBC SEEN     NO ORGANISMS SEEN   Culture NO GROWTH 3 DAYS   Final    Report Status 10/06/2012 FINAL   Final   ANAEROBIC CULTURE     Status: Normal   Collection Time   10/02/12  6:51 PM      Component Value Range Status Comment   Specimen Description WRIST RIGHT 2   Final    Special Requests NONE   Final    Gram Stain     Final    Value: NO WBC SEEN     NO ORGANISMS SEEN   Culture NO ANAEROBES ISOLATED   Final    Report Status  10/07/2012 FINAL   Final   AFB CULTURE WITH SMEAR     Status: Normal (Preliminary result)   Collection Time   10/02/12  6:51 PM      Component Value Range Status Comment   Specimen Description WRIST RIGHT 2   Final    Special Requests NONE   Final    ACID FAST SMEAR NO ACID FAST BACILLI SEEN   Final    Culture     Final    Value: CULTURE WILL BE EXAMINED FOR 6 WEEKS BEFORE ISSUING A FINAL REPORT   Report Status PENDING   Incomplete   BODY FLUID CULTURE     Status: Normal   Collection Time   10/02/12  7:34 PM      Component Value Range Status Comment   Specimen Description FLUID RIGHT WRIST 3   Final    Special Requests NONE   Final    Gram Stain     Final    Value: RARE WBC PRESENT,BOTH PMN AND MONONUCLEAR     NO ORGANISMS SEEN   Culture     Final    Value: ENTEROBACTER CLOACAE     Note: SUSCEPTIBILITIES PERFORMED ON PREVIOUS CULTURE WITHIN THE LAST 5 DAYS. CRITICAL RESULT CALLED TO, READ BACK BY AND VERIFIED WITH: DANNA FRANKLIN@0923  ON [id number] BY [facility]   Report Status 10/05/2012 FINAL   Final   ANAEROBIC CULTURE     Status: Normal   Collection Time   10/02/12  7:34 PM      Component Value Range Status Comment   Specimen Description FLUID RIGHT WRIST 3   Final    Special Requests NONE   Final    Gram Stain     Final    Value: RARE WBC PRESENT,BOTH PMN AND MONONUCLEAR     NO ORGANISMS SEEN   Culture NO ANAEROBES ISOLATED   Final    Report Status 10/07/2012 FINAL   Final      Studies: Dg Pelvis Portable  10/06/2012  *RADIOLOGY REPORT*  Clinical Data: Postoperative radiograph, status post total right hip arthroplasty.  PORTABLE PELVIS  Comparison: Pelvis radiograph performed 09/28/2012  Findings: There has been successful placement of a total right hip arthroplasty, in expected position, without evidence of new fracture or  loosening.  Prominent heterotopic bone formation is again noted about the comminuted fracture at the proximal femoral diaphysis and femoral trochanters.   Visualized cerclage wires appear grossly intact.  The left hip joint is unremarkable in appearance.  Mild sclerotic change is seen at the sacroiliac joints.  Mild degenerative change is noted at the lower lumbar spine.  The visualized bowel gas pattern is grossly unremarkable.  IMPRESSION: Right total hip arthroplasty is grossly unremarkable in appearance, without evidence of new fracture or loosening.  Prominent heterotopic bone formation again noted about the comminuted fracture at the proximal femoral diaphysis and femoral trochanters.   Original Report Authenticated By: Tonia Ghent, M.D.    Dg Hip Portable 1 View Right  10/05/2012  *RADIOLOGY REPORT*  Clinical Data: Right hip surgery  PORTABLE RIGHT HIP - 1 VIEW  Comparison: Earlier film of the same day  Findings: This second intraoperative radiograph shows placement of additional cerclage wires about the oblique fracture of the proximal femoral shaft, fragments in near anatomic alignment. Femoral and acetabular components of hip arthroplasty project in expected location.  Component of the knee arthroplasty is partially seen.  IMPRESSION: Right hip arthroplasty with cerclage fixation of proximal femoral shaft oblique fracture   Original Report Authenticated By: D. Andria Rhein, MD    Dg Hip Portable 1 View Right  10/05/2012  *RADIOLOGY REPORT*  Clinical Data: The hip replacement surgery  PORTABLE RIGHT HIP - 1 VIEW  Comparison: 09/28/2012  Findings: Single intraoperative image documents removal of the IM rod.  Acetabular component hip arthroplasty projects in expected location.  Femoral component spacer is noted, incompletely visualized.  There is a cerclage wire around the mid femoral shaft.  IMPRESSION:  Removal of IM rod and changes of right hip replacement surgery.   Original Report Authenticated By: D. Andria Rhein, MD     Scheduled Meds:    . amiodarone  100 mg Oral Daily  . atorvastatin  80 mg Oral Daily  . calcium chloride  1 g  Intravenous Once  . calcium chloride  1 g Intravenous Once  . cefTRIAXone (ROCEPHIN)  IV  2 g Intravenous Q24H  . cholecalciferol  2,000 Units Oral Daily  . clonazePAM  2 mg Oral TID  . diltiazem  180 mg Oral Daily  . docusate sodium  100 mg Oral BID  . doxycycline  100 mg Oral QHS  . DULoxetine  60 mg Oral Daily  . ferrous sulfate  325 mg Oral TID PC  . levothyroxine  200 mcg Oral QAC breakfast  . multivitamin with minerals  1 tablet Oral Daily  . pantoprazole  40 mg Oral Daily  . polyethylene glycol  17 g Oral Daily  . ranolazine  500 mg Oral BID  . Rivaroxaban  20 mg Oral Q supper  . sodium chloride  3 mL Intravenous Q12H  . Tamsulosin HCl  0.4 mg Oral Daily   Continuous Infusions:    . sodium chloride 20 mL/hr (10/07/12 0836)    Principal Problem:  *Right femoral fracture Active Problems:  Right forearm fracture  Atrial fibrillation  Hypothyroidism  History of recurrent UTIs  Normocytic anemia  Low testosterone, possible hypogonadism  HTN (hypertension)  Psoriasis  Dyslipidemia  Vitamin D deficiency  GERD (gastroesophageal reflux disease)  Hyperglycemia  Anxiety  Depression  CAD (coronary artery disease)  DJD (degenerative joint disease)  Chronic gingivitis  Anemia    Ryna Beckstrom A, MD  Pager [phone] If 8PM-8AM, please contact night-coverage at [email or web address], password [facility] 10/07/2012,  10:22 AM  LOS: 9 days   Time spent: 20 minutes

## 2012-10-07 NOTE — Progress Notes (Signed)
Physical Therapy Treatment Patient Details Name: [name] MRN: [id number] DOB: May 02, 1946 Today's Date: 10/07/2012 Time: 1212-1240 PT Time Calculation (min): 28 min  PT Assessment / Plan / Recommendation Comments on Treatment Session  POD # 2 R LE conversion from ORIF to THR Posterior approach plus POD # 5 R wrist ORIF 2nd fall/Fx.  Pt progressing slowly and requires + 2 assist.  Assisted pt OOB to amb a short distance of 10' when pt had hypotension/syncope episode.  Chair already behind pt for safety, so quickly assisted to sit/recline. RN aware and was present. Reclined pt in chair and was able to perform  TE's.    Follow Up Recommendations  SNF     Does the patient have the potential to tolerate intense rehabilitation     Barriers to Discharge        Equipment Recommendations  None recommended by PT    Recommendations for Other Services    Frequency 7X/week   Plan Discharge plan remains appropriate    Precautions / Restrictions Precautions Precautions: Fall;Posterior Hip Restrictions Weight Bearing Restrictions: Yes RUE Weight Bearing: Partial weight bearing RUE Partial Weight Bearing Percentage or Pounds: 50% RLE Weight Bearing: Partial weight bearing RLE Partial Weight Bearing Percentage or Pounds: 50%   Pertinent Vitals/Pain C/o 6/10 R hip pain Pre medicated ICE applied    Mobility  Bed Mobility Bed Mobility: Supine to Sit;Sitting - Scoot to Edge of Bed Supine to Sit: 2: Max assist Sitting - Scoot to [place] of Bed: 2: Max assist Details for Bed Mobility Assistance: HOB elevated 50' and use of pad to swiaval hips around as pt was unable to use R UE (ACE wrap cast) Transfers Transfers: Sit to Stand;Stand to Sit Sit to Stand: From elevated surface;From bed;1: +2 Total assist Sit to Stand: Patient Percentage: 80% Stand to Sit: 1: +2 Total assist Stand to Sit: Patient Percentage: 10% Details for Transfer Assistance: Very elevated bed sit to stand performed  well. Total assist + 2 stand to sit as pt became orthostatic/near syncope episode. Chair was closely behind allready. Reclined in chair pt came back around. Vitals taken. Ambulation/Gait Ambulation/Gait Assistance: 1: +2 Total assist Ambulation/Gait: Patient Percentage: 80% Ambulation Distance (Feet): 10 Feet Assistive device: Right platform walker Ambulation/Gait Assistance Details: Pt aware he is PWB with limited mobility due to ORIF converted to THR.  Amb in room + 3 assist.  After 10 feet pt became non verbal and slumped forward in which time we assisted him to chair quickly and placed him in reclined position. BP was 101/71, HR 124 and RA sats 94%. Gait Pattern: Step-to pattern Gait velocity: decreased    Exercises Total Joint Exercises Ankle Circles/Pumps: AROM;Both;10 reps;Seated Quad Sets: AROM;Both;10 reps;Seated Gluteal Sets: AROM;Both;10 reps;Seated Heel Slides: AAROM;Right;10 reps;Supine     PT Goals                                    progressing    Visit Information  Last PT Received On: 10/07/12 Assistance Needed: +2    Subjective Data   I hope I don't have another 4 months of this like I had with my first hip (emotional/crying)   Cognition    good   Balance   fair  End of Session PT - End of Session Equipment Utilized During Treatment: Gait belt Activity Tolerance: Treatment limited secondary to medical complications (Comment) (hypotension/near syncope episode) Patient left: in chair;with  call bell/phone within reach;with nursing in room   Felecia Shelling  PTA [facility]  Acute  Rehab Pager     [phone]

## 2012-10-07 NOTE — Plan of Care (Signed)
Problem: Phase I Progression Outcomes Goal: Tubes/drains patent Outcome: Not Applicable Date Met:  10/07/12 Hemovac removed this am by Dr. Rennis Chris on rounds.

## 2012-10-07 NOTE — Progress Notes (Signed)
Physical Therapy Treatment Patient Details Name: [name] MRN: [id number] DOB: May 06, 1946 Today's Date: 10/07/2012 Time: 4098-1191 PT Time Calculation (min): 21 min  PT Assessment / Plan / Recommendation Comments on Treatment Session   POD # 2 R THRevision/conversion from ORIF and R wrist ORIF POD # 5 due to fall/fx.  Pt progressing slowly and demon MAX anxiety/emotional and requires increased time and positive reinforcement.  Assisted pt back to bed + 2 total assist with near fall as pt PANIC during mid transfer, hyperventilating and trembling throughout.     Follow Up Recommendations        Does the patient have the potential to tolerate intense rehabilitation     Barriers to Discharge        Equipment Recommendations       Recommendations for Other Services    Frequency     Plan   Skilled Nursing for ST Rehab   Precautions / Restrictions   R LE/UE PWB 50% THP  Pertinent Vitals/Pain C/o 7/10 R hip pain during activity ICE applied    Mobility  Bed Mobility Bed Mobility: Sit to Supine Sit to Supine: 1: +2 Total assist Sit to Supine: Patient Percentage: 30% Details for Bed Mobility Assistance: Total assist + 2 to get pt back to bed to comfort Transfers Transfers: Sit to Stand;Stand to Sit Sit to Stand: 1: +2 Total assist;From chair/3-in-1 Sit to Stand: Patient Percentage: 40% Stand to Sit: 1: +2 Total assist Stand to Sit: Patient Percentage: 60% Details for Transfer Assistance: Increased difficulty getting pt out of recliner which is lower than the bed surface. During mid transfered pt began to panic, trembling throughout, hyperventilating and nearly fell. Pt requires MAX VC's to decrease anxiety and increased time to recover.  Very emotional. Ambulation/Gait Ambulation/Gait Assistance Details: Unable to attempt this afternoon due to pt's increased anxiety, so only assisted back to bed.     PT Goals                                                           Progressing slowly    Visit Information  Last PT Received On: 10/07/12 Assistance Needed: +2    Subjective Data      Cognition    AxOx4   Balance   poor  End of Session  Left pt in bed with ICE to R hip and R UE elevated Call light in reach   Felecia Shelling  PTA [facility]  Acute  Rehab Pager     [phone]

## 2012-10-07 NOTE — Progress Notes (Signed)
[name]  MRN: [id number] DOB/Age: Aug 28, 1946 66 y.o. Physician: Lynnea Maizes, M.D. 2 Days Post-Op Procedure(s) (LRB): TOTAL HIP REVISION (Right) HARDWARE REMOVAL (Right)  Subjective: Pain reasonably controlled, requesting "nerve pill" to be given TID which is how he takes at home Vital Signs Temp:  [97.7 F (36.5 C)-98.3 F (36.8 C)] 97.7 F (36.5 C) (12/28 0642) Pulse Rate:  [92-107] 92  (12/28 0642) Resp:  [16-97] 16  (12/28 0642) BP: (137-173)/(70-82) 173/73 mmHg (12/28 0642) SpO2:  [19 %-98 %] 96 % (12/28 0642)  Lab Results  Basename 10/07/12 0558 10/06/12 1240  WBC 9.6 10.2  HGB 8.7* 9.3*  HCT 26.1* 26.7*  PLT 170 186   BMET  Basename 10/07/12 0558 10/06/12 0530  NA 130* 130*  K 3.9 4.7  CL 97 95*  CO2 28 27  GLUCOSE 148* 185*  BUN 25* 22  CREATININE 0.81 0.85  CALCIUM 8.1* 8.8   INR  Date Value Range Status  10/01/2012 1.03  0.00 - 1.49 Final     Exam  N/V intact RLE, hemovac d/c'd, thigh soft. RUE splinted, stable  Plan D/c foley, mobilize with PT, 50% pwb RLE Branda Chaudhary M 10/07/2012, 9:02 AM

## 2012-10-08 LAB — CBC
MCHC: 33 g/dL (ref 30.0–36.0)
Platelets: 152 10*3/uL (ref 150–400)
RDW: 17 % — ABNORMAL HIGH (ref 11.5–15.5)

## 2012-10-08 LAB — BASIC METABOLIC PANEL
Calcium: 7.9 mg/dL — ABNORMAL LOW (ref 8.4–10.5)
GFR calc Af Amer: >90 mL/min (ref >90)
GFR calc non Af Amer: >90 mL/min (ref >90)
Potassium: 3.6 mEq/L (ref 3.5–5.1)
Sodium: 132 mEq/L — ABNORMAL LOW (ref 135–145)

## 2012-10-08 MED ORDER — FUROSEMIDE 10 MG/ML IJ SOLN
20.0000 mg | Freq: Once | INTRAMUSCULAR | Status: AC
  Administered 2012-10-08: 20 mg via INTRAVENOUS
  Filled 2012-10-08: qty 2

## 2012-10-08 MED ORDER — LEVOTHYROXINE SODIUM 25 MCG PO TABS
225.0000 ug | ORAL_TABLET | Freq: Every day | ORAL | Status: DC
  Administered 2012-10-09 – 2012-10-10 (×2): 225 ug via ORAL
  Filled 2012-10-08 (×3): qty 1

## 2012-10-08 NOTE — Progress Notes (Signed)
Physical Therapy Treatment Patient Details Name: [name] MRN: [id number] DOB: 11/25/1945 Today's Date: 10/08/2012 Time: 7829-5621 PT Time Calculation (min): 12 min  PT Assessment / Plan / Recommendation Comments on Treatment Session  Pt progressing, however continues to fatigue quickly.     Follow Up Recommendations  SNF     Does the patient have the potential to tolerate intense rehabilitation     Barriers to Discharge        Equipment Recommendations  None recommended by PT    Recommendations for Other Services    Frequency 7X/week   Plan Discharge plan remains appropriate    Precautions / Restrictions Precautions Precautions: Fall;Posterior Hip Precaution Comments: Pt not able to recall hip precautions.  Re-educated pt on all three THP.  Restrictions Weight Bearing Restrictions: Yes RUE Weight Bearing: Partial weight bearing RUE Partial Weight Bearing Percentage or Pounds: 50% RLE Weight Bearing: Partial weight bearing RLE Partial Weight Bearing Percentage or Pounds: 50%   Pertinent Vitals/Pain     Mobility  Bed Mobility Bed Mobility: Sit to Supine Supine to Sit: 4: Min assist;3: Mod assist Sitting - Scoot to Edge of Bed: 4: Min assist Sit to Supine: 1: +2 Total assist Sit to Supine: Patient Percentage: 70% Details for Bed Mobility Assistance: assist for R LE: cues for sequence Transfers Transfers: Sit to Stand;Stand to Sit Sit to Stand: 1: +2 Total assist;With armrests;From chair/3-in-1 Sit to Stand: Patient Percentage: 70% Stand to Sit: 1: +2 Total assist;To bed;With upper extremity assist;To elevated surface Stand to Sit: Patient Percentage: 70% Details for Transfer Assistance: Assist to rise and steady with cues for hand placement and LE management to maintain THP.   Ambulation/Gait Ambulation/Gait Assistance: 1: +2 Total assist Ambulation/Gait: Patient Percentage: 70% Ambulation Distance (Feet): 6 Feet Assistive device: Right platform  walker Ambulation/Gait Assistance Details: cues for position from RW and sequence to insure PWB Gait Pattern: Step-to pattern Gait velocity: decreased General Gait Details: Pt is impulsive and more focused on getting to bed than doing it safely    Exercises Total Joint Exercises Ankle Circles/Pumps: AROM;Both;20 reps Quad Sets: AROM;Both;10 reps;Seated Heel Slides: AAROM;Right;10 reps;Supine Hip ABduction/ADduction: AAROM;Right;10 reps   PT Diagnosis:    PT Problem List:   PT Treatment Interventions:     PT Goals Acute Rehab PT Goals PT Goal Formulation: With patient Time For Goal Achievement: 10/20/12 Potential to Achieve Goals: Good Pt will go Supine/Side to Sit: with min assist;with rail PT Goal: Supine/Side to Sit - Progress: Progressing toward goal Pt will go Sit to Supine/Side: with min assist;with rail PT Goal: Sit to Supine/Side - Progress: Progressing toward goal Pt will Transfer Bed to Chair/Chair to Bed: with +2 total assist PT Transfer Goal: Bed to Chair/Chair to Bed - Progress: Progressing toward goal Pt will Perform Home Exercise Program: with min assist PT Goal: Perform Home Exercise Program - Progress: Progressing toward goal Additional Goals Additional Goal #1: state 3/3 posterior hip precautions PT Goal: Additional Goal #1 - Progress: Progressing toward goal  Visit Information  Last PT Received On: 10/08/12 Assistance Needed: +2    Subjective Data  Subjective: I just need to get back to bed Patient Stated Goal: to go to [place].   Cognition  Overall Cognitive Status: Appears within functional limits for tasks assessed/performed Arousal/Alertness: Awake/alert Orientation Level: Appears intact for tasks assessed Behavior During Session: [facility] for tasks performed    Balance     End of Session PT - End of Session Equipment Utilized During Treatment:  Gait belt Activity Tolerance: Patient limited by fatigue;Patient limited by pain Patient left: in bed;with  call bell/phone within reach Nurse Communication: Mobility status   GP     [name] 10/08/2012, 1:16 PM

## 2012-10-08 NOTE — Progress Notes (Signed)
[facility] PROGRESS NOTE  [name]:[id number] DOB: 1946/08/12 DOA: 09/28/2012 PCP: Ebbie Ridge, MD  Assessment/Plan: Principal Problem:  *Right femoral fracture Active Problems:  Right forearm fracture  Atrial fibrillation  Hypothyroidism  History of recurrent UTIs  Normocytic anemia  Low testosterone, possible hypogonadism  HTN (hypertension)  Psoriasis  Dyslipidemia  Vitamin D deficiency  GERD (gastroesophageal reflux disease)  Hyperglycemia  Anxiety  Depression  CAD (coronary artery disease)  DJD (degenerative joint disease)  Chronic gingivitis  Anemia    1. Right femoral fracture s/p mechanical fall: Patient presented following a mechanical fall, complicated by right femoral periprosthetic fracture. Dr Durene Romans provided orthopedic consultation, and patient is status post revision and conversion of failed right total hip surgery due to non-union of complex proximal femoral fracture, on 10/05/2012. Post operatively, condition has remained satisfactory. Wound vac was discontinued on 10/07/2012. Recommended up with therapy, 50% weight bearing on both the right leg and right arm. Continue PT. SNF recommended.  2. 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 Dr Dominica Severin privided consultation, and patient is s/p removal of hardware, irrigation and debridement on 10/02/2012. Dr Cliffton Asters provided initial ID consultation, and patient was initially managed with iv Vancomycin/Cefepime. Patrient was subsequently seen by Dr Johny Sax, ID, wound culture grew Enterobacter [place] and patient was transitioned to Ceftriaxone monotherapy. PICC line was placed on 10/04/2012. Patient will need 42 days of antibiotics from 10/06/2012. Per orthopedic  surgeon, will need grafting of the defect at a later date.  3. Normocytic anemia: This was due to ABLA from surgery. Patient is s/p transfusion of total of 6 units PRBC on 10/05/12, and HB is 7.7 today. Transfuse a further 2 units of blood today.  4. E. Coli UTI: This was an incidental finding, based on a positive urinary sediment. Urine culture revealed E. Coli. Treated with a brief course of Rocephin 12/20-12/22.  5. Atrial fibrillation:  Patient has a known history of atrial fibrillation, and was on Xarelto, pre-admission, which was held peri-operatively. Stable, in SR on EKG on admission and telemetry on Cardizem and Amiodarone. Was on Lovenox until 10/05/2012. Restarted back on Xarelto on 10/07/12. 6. HTN: Controlled/Stable on pre-admission antihypertensives.  7. Hypothyroidism: TSH was elevated at 7.848 on admission. Have increased Synthroid from 200 mcg daily, to 225 mcg daily. Will need repeat TSH in 4-6 weeks.  8. Obstructive sleep apnea:  Continued on nocturnal CPAP. Not problematic.  9. Hyponatremia: Etiology unclear. Likely related to pain and/or dysthyroidism. Appears to be chronic.   Code Status: Full Code.  Family Communication:  Disposition Plan: To be determined. Aiming SNF on 10/09/12.    Brief narrative: 66 year old male with history of hypertension, atrial fibrillation, hyperlipidemia, hypothyroidism, brought to the ED, following a fall at his house and right hip pain. X-rays reveal a periprosthetic right hip fracture. Patient has had a fracture of the same area in 05/2012, repaired surgically. He states that he slipped and fell did not lose consciousness. Admitted for further management.    Consultants: Orthopedics Dr. Amanda Pea and Dr. Charlann Boxer  ID, Dr. Campbell/Dr. Ninetta Lights   Procedures: Removal of hardware, irrigation and debridement of the right forearm on 10/02/2012.   Antibiotics: Rocephin 09/29/12-10/01/12  Doxycycline. Commenced prior to admission.  Cefazolin  10/03/2012-10/03/2012  Vancomycin 10/03/2012-10/06/2012  Cefepime 10/03/2012-10/06/2012  Ceftriaxone 10/06/2012 >> (42 more  days from 10/06/2012)  HPI/Subjective: Constipated, otherwise, no new issues.   Objective: Vital signs in last 24 hours: Temp:  [97.8 F (36.6 C)-98.4 F (36.9 C)] 98.2 F (36.8 C) (12/29 0603) Pulse Rate:  [86-120] 86  (12/29 0603) Resp:  [18-20] 20  (12/29 0603) BP: (101-164)/(68-85) 141/85 mmHg (12/29 0603) SpO2:  [95 %-98 %] 96 % (12/29 0603) Weight change:  Last BM Date: 10/04/12  Intake/Output from previous day: 12/28 0701 - 12/29 0700 In: 723.7 [P.O.:240; I.V.:483.7] Out: 925 [Urine:925]     Physical Exam: General: Comfortable, alert, communicative, fully oriented, not short of breath at rest.  HEENT:  Moderate clinical pallor, no jaundice, no conjunctival injection or discharge. NECK:  Supple, JVP not seen, no carotid bruits, no palpable lymphadenopathy, no palpable goiter. CHEST:  Clinically clear to auscultation, no wheezes, no crackles. HEART:  Sounds 1 and 2 heard, normal, regular, no murmurs. ABDOMEN:  Obese, soft, non-tender, no palpable organomegaly, no palpable masses, normal bowel sounds. GENITALIA:  Not examined. UPPER EXTREMITIES: RUE is heavily bandaged.  LOWER EXTREMITIES:  Right hip surgical site, has clean and dry dressings. No pitting edema, palpable peripheral pulses. MUSCULOSKELETAL SYSTEM:  Generalized osteoarthritic changes, otherwise, normal. CENTRAL NERVOUS SYSTEM:  No focal neurologic deficit on gross examination.  Lab Results:  [facility] 10/08/12 0530 10/07/12 0558  WBC 10.0 9.6  HGB 7.7* 8.7*  HCT 23.3* 26.1*  PLT 152 170    Basename 10/08/12 0530 10/07/12 0558  NA 132* 130*  K 3.6 3.9  CL 97 97  CO2 28 28  GLUCOSE 128* 148*  BUN 25* 25*  CREATININE 0.79 0.81  CALCIUM 7.9* 8.1*   Recent Results (from the past 240 hour(s))  URINE CULTURE     Status: Normal   Collection Time   09/29/12 12:21 AM       Component Value Range Status Comment   Specimen Description URINE, CLEAN CATCH   Final    Special Requests NONE   Final    Culture  Setup Time 09/29/2012 01:25   Final    Colony Count >=100,000 COLONIES/ML   Final    Culture ESCHERICHIA COLI   Final    Report Status 09/30/2012 FINAL   Final    Organism ID, Bacteria ESCHERICHIA COLI   Final   SURGICAL PCR SCREEN     Status: Normal   Collection Time   09/30/12  7:58 PM      Component Value Range Status Comment   MRSA, PCR NEGATIVE  NEGATIVE Final    Staphylococcus aureus NEGATIVE  NEGATIVE Final   MRSA PCR SCREENING     Status: Normal   Collection Time   10/02/12  3:25 PM      Component Value Range Status Comment   MRSA by PCR NEGATIVE  NEGATIVE Final   BODY FLUID CULTURE     Status: Normal   Collection Time   10/02/12  6:29 PM      Component Value Range Status Comment   Specimen Description FLUID RIGHT WRIST 1   Final    Special Requests NONE   Final    Gram Stain     Final    Value: RARE WBC PRESENT, PREDOMINANTLY MONONUCLEAR     NO ORGANISMS SEEN   Culture     Final    Value: FEW ENTEROBACTER CLOACAE     Note: CRITICAL RESULT CALLED TO, READ BACK BY AND VERIFIED WITH: DANNA FRANKLIN@0923  ON [id number] BY [facility]   Report Status 10/05/2012 FINAL  Final    Organism ID, Bacteria ENTEROBACTER CLOACAE   Final   ANAEROBIC CULTURE     Status: Normal   Collection Time   10/02/12  6:29 PM      Component Value Range Status Comment   Specimen Description FLUID RIGHT WRIST 1   Final    Special Requests NONE   Final    Gram Stain     Final    Value: RARE WBC PRESENT, PREDOMINANTLY MONONUCLEAR     NO ORGANISMS SEEN   Culture NO ANAEROBES ISOLATED   Final    Report Status 10/07/2012 FINAL   Final   TISSUE CULTURE     Status: Normal   Collection Time   10/02/12  6:31 PM      Component Value Range Status Comment   Specimen Description WRIST RIGHT 1   Final    Special Requests NONE   Final    Gram Stain     Final    Value: NO WBC SEEN      NO ORGANISMS SEEN   Culture FEW ENTEROBACTER CLOACAE   Final    Report Status 10/06/2012 FINAL   Final    Organism ID, Bacteria ENTEROBACTER CLOACAE   Final   ANAEROBIC CULTURE     Status: Normal   Collection Time   10/02/12  6:31 PM      Component Value Range Status Comment   Specimen Description WRIST RIGHT 1   Final    Special Requests NONE   Final    Gram Stain     Final    Value: NO WBC SEEN     NO ORGANISMS SEEN   Culture NO ANAEROBES ISOLATED   Final    Report Status 10/07/2012 FINAL   Final   AFB CULTURE WITH SMEAR     Status: Normal (Preliminary result)   Collection Time   10/02/12  6:31 PM      Component Value Range Status Comment   Specimen Description WRIST RIGHT 1   Final    Special Requests NONE   Final    ACID FAST SMEAR NO ACID FAST BACILLI SEEN   Final    Culture     Final    Value: CULTURE WILL BE EXAMINED FOR 6 WEEKS BEFORE ISSUING A FINAL REPORT   Report Status PENDING   Incomplete   FUNGUS CULTURE W SMEAR     Status: Normal (Preliminary result)   Collection Time   10/02/12  6:31 PM      Component Value Range Status Comment   Specimen Description WRIST RIGHT 1   Final    Special Requests NONE   Final    Fungal Smear NO YEAST OR FUNGAL ELEMENTS SEEN   Final    Culture CULTURE IN PROGRESS FOR FOUR WEEKS   Final    Report Status PENDING   Incomplete   BODY FLUID CULTURE     Status: Normal   Collection Time   10/02/12  6:48 PM      Component Value Range Status Comment   Specimen Description FLUID RIGHT WRIST 2   Final    Special Requests NONE   Final    Gram Stain     Final    Value: RARE WBC PRESENT,BOTH PMN AND MONONUCLEAR     NO ORGANISMS SEEN   Culture     Final    Value: FEW ENTEROBACTER CLOACAE     Note: SUSCEPTIBILITIES PERFORMED ON PREVIOUS CULTURE WITHIN THE LAST 5 DAYS.  CRITICAL RESULT CALLED TO, READ BACK BY AND VERIFIED WITH: DANNA FRANKLIN@0923  ON [id number] BY [facility]   Report Status 10/05/2012 FINAL   Final   ANAEROBIC CULTURE     Status:  Normal   Collection Time   10/02/12  6:48 PM      Component Value Range Status Comment   Specimen Description FLUID RIGHT WRIST 2   Final    Special Requests NONE   Final    Gram Stain     Final    Value: RARE WBC PRESENT,BOTH PMN AND MONONUCLEAR     NO ORGANISMS SEEN   Culture NO ANAEROBES ISOLATED   Final    Report Status 10/07/2012 FINAL   Final   TISSUE CULTURE     Status: Normal   Collection Time   10/02/12  6:51 PM      Component Value Range Status Comment   Specimen Description WRIST RIGHT 2   Final    Special Requests NONE   Final    Gram Stain     Final    Value: NO WBC SEEN     NO ORGANISMS SEEN   Culture NO GROWTH 3 DAYS   Final    Report Status 10/06/2012 FINAL   Final   ANAEROBIC CULTURE     Status: Normal   Collection Time   10/02/12  6:51 PM      Component Value Range Status Comment   Specimen Description WRIST RIGHT 2   Final    Special Requests NONE   Final    Gram Stain     Final    Value: NO WBC SEEN     NO ORGANISMS SEEN   Culture NO ANAEROBES ISOLATED   Final    Report Status 10/07/2012 FINAL   Final   AFB CULTURE WITH SMEAR     Status: Normal (Preliminary result)   Collection Time   10/02/12  6:51 PM      Component Value Range Status Comment   Specimen Description WRIST RIGHT 2   Final    Special Requests NONE   Final    ACID FAST SMEAR NO ACID FAST BACILLI SEEN   Final    Culture     Final    Value: CULTURE WILL BE EXAMINED FOR 6 WEEKS BEFORE ISSUING A FINAL REPORT   Report Status PENDING   Incomplete   BODY FLUID CULTURE     Status: Normal   Collection Time   10/02/12  7:34 PM      Component Value Range Status Comment   Specimen Description FLUID RIGHT WRIST 3   Final    Special Requests NONE   Final    Gram Stain     Final    Value: RARE WBC PRESENT,BOTH PMN AND MONONUCLEAR     NO ORGANISMS SEEN   Culture     Final    Value: ENTEROBACTER CLOACAE     Note: SUSCEPTIBILITIES PERFORMED ON PREVIOUS CULTURE WITHIN THE LAST 5 DAYS. CRITICAL  RESULT CALLED TO, READ BACK BY AND VERIFIED WITH: DANNA FRANKLIN@0923  ON [id number] BY [facility]   Report Status 10/05/2012 FINAL   Final   ANAEROBIC CULTURE     Status: Normal   Collection Time   10/02/12  7:34 PM      Component Value Range Status Comment   Specimen Description FLUID RIGHT WRIST 3   Final    Special Requests NONE   Final    Gram Stain     Final  Value: RARE WBC PRESENT,BOTH PMN AND MONONUCLEAR     NO ORGANISMS SEEN   Culture NO ANAEROBES ISOLATED   Final    Report Status 10/07/2012 FINAL   Final      Studies/Results: No results found.  Medications: Scheduled Meds:   . amiodarone  100 mg Oral Daily  . atorvastatin  80 mg Oral Daily  . calcium chloride  1 g Intravenous Once  . calcium chloride  1 g Intravenous Once  . cefTRIAXone (ROCEPHIN)  IV  2 g Intravenous Q24H  . cholecalciferol  2,000 Units Oral Daily  . clonazePAM  2 mg Oral TID  . diltiazem  180 mg Oral Daily  . docusate sodium  100 mg Oral BID  . doxycycline  100 mg Oral QHS  . DULoxetine  60 mg Oral Daily  . ferrous sulfate  325 mg Oral TID PC  . levothyroxine  200 mcg Oral QAC breakfast  . multivitamin with minerals  1 tablet Oral Daily  . pantoprazole  40 mg Oral Daily  . polyethylene glycol  17 g Oral Daily  . ranolazine  500 mg Oral BID  . Rivaroxaban  20 mg Oral Q supper  . sodium chloride  3 mL Intravenous Q12H  . Tamsulosin HCl  0.4 mg Oral Daily   Continuous Infusions:   . sodium chloride 20 mL/hr (10/07/12 0836)   PRN Meds:.acetaminophen, acetaminophen, HYDROmorphone (DILAUDID) injection, menthol-cetylpyridinium, methocarbamol (ROBAXIN) IV, methocarbamol, metoCLOPramide (REGLAN) injection, metoCLOPramide, ondansetron (ZOFRAN) IV, ondansetron, oxyCODONE, oxyCODONE-acetaminophen, phenol, sodium chloride    LOS: 10 days   Particia Strahm,CHRISTOPHER  [facility] Pager [phone]. If 8PM-8AM, please contact night-coverage at [email or web address], password [facility] 10/08/2012, 10:28 AM  LOS: 10 days

## 2012-10-08 NOTE — Progress Notes (Signed)
Subjective: 3 Days Post-Op Procedure(s) (LRB): TOTAL HIP REVISION (Right) HARDWARE REMOVAL (Right) Patient reports pain as moderate.    Objective: Vital signs in last 24 hours: Temp:  [97.8 F (36.6 C)-98.4 F (36.9 C)] 98.2 F (36.8 C) (12/29 0603) Pulse Rate:  [86-120] 86  (12/29 0603) Resp:  [18-20] 20  (12/29 0603) BP: (101-164)/(68-85) 141/85 mmHg (12/29 0603) SpO2:  [95 %-98 %] 96 % (12/29 0603)  Intake/Output from previous day: 12/28 0701 - 12/29 0700 In: 723.7 [P.O.:240; I.V.:483.7] Out: 925 [Urine:925] Intake/Output this shift: Total I/O In: -  Out: 300 [Urine:300]   Basename 10/08/12 0530 10/07/12 0558 10/06/12 1240 10/06/12 0530  HGB 7.7* 8.7* 9.3* 9.9*    Basename 10/08/12 0530 10/07/12 0558  WBC 10.0 9.6  RBC 2.74* 3.12*  HCT 23.3* 26.1*  PLT 152 170    Basename 10/08/12 0530 10/07/12 0558  NA 132* 130*  K 3.6 3.9  CL 97 97  CO2 28 28  BUN 25* 25*  CREATININE 0.79 0.81  GLUCOSE 128* 148*  CALCIUM 7.9* 8.1*   No results found for this basename: LABPT:2,INR:2 in the last 72 hours  Neurologically intact Neurovascular intact Sensation intact distally Intact pulses distally Dorsiflexion/Plantar flexion intact Incision: scant drainage Compartment soft  Assessment/Plan: 3 Days Post-Op Procedure(s) (LRB): TOTAL HIP REVISION (Right) HARDWARE REMOVAL (Right) Up with therapy partial weightbearing Changed distal aspect of dressing due to light drainage on dressing  [name] M. 10/08/2012, 12:00 PM

## 2012-10-08 NOTE — Progress Notes (Signed)
Physical Therapy Treatment Patient Details Name: [name] MRN: [id number] DOB: [birth year] Today's Date: 10/08/2012 Time: 0932-1020 PT Time Calculation (min): 48 min  PT Assessment / Plan / Recommendation Comments on Treatment Session  Pt progressing, however continues to fatigue quickly.  He was able to ambulate somewhat further distance today.     Follow Up Recommendations  SNF     Does the patient have the potential to tolerate intense rehabilitation     Barriers to Discharge        Equipment Recommendations  None recommended by PT    Recommendations for Other Services    Frequency 7X/week   Plan Discharge plan remains appropriate    Precautions / Restrictions Precautions Precautions: Fall;Posterior Hip Precaution Comments: Pt not able to recall hip precautions.  Re-educated pt on all three THP.  Restrictions Weight Bearing Restrictions: Yes RUE Weight Bearing: Partial weight bearing RUE Partial Weight Bearing Percentage or Pounds: 50% RLE Weight Bearing: Partial weight bearing RLE Partial Weight Bearing Percentage or Pounds: 50%   Pertinent Vitals/Pain 5/10    Mobility  Bed Mobility Bed Mobility: Supine to Sit Supine to Sit: 4: Min assist;3: Mod assist Sitting - Scoot to Edge of Bed: 4: Min assist Details for Bed Mobility Assistance: Assist for RLE out of bed and some assist for trunk to attain sitting position.  Pt did much better today getting to EOB.  Transfers Transfers: Sit to Stand;Stand to Sit Sit to Stand: 1: +2 Total assist;From bed;From elevated surface Sit to Stand: Patient Percentage: 50% Stand to Sit: 1: +2 Total assist;With armrests;To chair/3-in-1 Stand to Sit: Patient Percentage: 60% Details for Transfer Assistance: Assist to rise and steady with cues for hand placement and LE management to maintain THP.   Ambulation/Gait Ambulation/Gait Assistance: 1: +2 Total assist Ambulation/Gait: Patient Percentage: 70% Ambulation Distance (Feet):  15 Feet Assistive device: Right platform walker Ambulation/Gait Assistance Details: Max cues for sequencing/technique with RW, controlled breathing, upright posture.  Noted pt to fatigue quickly, however took standing rest break and continued amb.  Gait Pattern: Step-to pattern Gait velocity: decreased    Exercises Total Joint Exercises Ankle Circles/Pumps: AROM;Both;20 reps Quad Sets: AROM;Both;10 reps;Seated Heel Slides: AAROM;Right;10 reps;Supine Hip ABduction/ADduction: AAROM;Right;10 reps   PT Diagnosis:    PT Problem List:   PT Treatment Interventions:     PT Goals Acute Rehab PT Goals PT Goal Formulation: With patient Time For Goal Achievement: 10/20/12 Potential to Achieve Goals: Good Pt will go Supine/Side to Sit: with min assist;with rail PT Goal: Supine/Side to Sit - Progress: Progressing toward goal Pt will Transfer Bed to Chair/Chair to Bed: with +2 total assist (pt assist 70%) PT Transfer Goal: Bed to Chair/Chair to Bed - Progress: Goal set today Pt will Perform Home Exercise Program: with min assist PT Goal: Perform Home Exercise Program - Progress: Progressing toward goal Additional Goals Additional Goal #1: state 3/3 posterior hip precautions PT Goal: Additional Goal #1 - Progress: Progressing toward goal  Visit Information  Last PT Received On: 10/08/12 Assistance Needed: +2    Subjective Data  Subjective: Do you think I am progressing? Patient Stated Goal: to go to [place].   Cognition  Overall Cognitive Status: Appears within functional limits for tasks assessed/performed Arousal/Alertness: Awake/alert Orientation Level: Appears intact for tasks assessed Behavior During Session: [facility] for tasks performed    Balance     End of Session PT - End of Session Equipment Utilized During Treatment: Gait belt Activity Tolerance: Patient limited by fatigue;Patient limited  by pain Patient left: in chair;with call bell/phone within reach;with nursing in  room Nurse Communication: Mobility status   GP     Page, Meribeth Mattes 10/08/2012, 10:54 AM

## 2012-10-08 NOTE — Discharge Summary (Signed)
Physician Discharge Summary  [name] ZOX:[id number] DOB: August 24, 1946 DOA: 09/28/2012  PCP: Ebbie Ridge, MD  Admit date: 09/28/2012 Discharge date: 10/10/2012  Time spent: 40 minutes  Recommendations for Outpatient Follow-up:  1. Follow up with Dr Johny Sax, ID clinic.  2. Follow up with Dr Dominica Severin, orthopedic surgeon. 3. Follow up with Dr Durene Romans, orthopedic surgeon.  4. Follow up with primary MD. 5. Primary MD recommended to recheck TSH in 4-6 weeks.   Discharge Diagnoses:  Principal Problem:  *Right femoral fracture Active Problems:  Right forearm fracture  Atrial fibrillation  Hypothyroidism  History of recurrent UTIs  Normocytic anemia  Low testosterone, possible hypogonadism  HTN (hypertension)  Psoriasis  Dyslipidemia  Vitamin D deficiency  GERD (gastroesophageal reflux disease)  Hyperglycemia  Anxiety  Depression  CAD (coronary artery disease)  DJD (degenerative joint disease)  Chronic gingivitis  Anemia   Discharge Condition: Satisfactory.   Diet recommendation: Heart-Healthy.   Filed Weights   09/29/12 0300 10/05/12 2355  Weight: 145.151 kg (320 lb) 151 kg (332 lb 14.3 oz)    History of present illness:  66 year old male with history of hypertension, atrial fibrillation, hyperlipidemia, hypothyroidism, brought to the ED, following a fall at his house and right hip pain. X-rays reveal a periprosthetic right hip fracture. Patient has had a fracture of the same area in 05/2012, repaired surgically. He states that he slipped and fell did not lose consciousness. Admitted for further management.    Hospital Course:  1. Right femoral fracture s/p mechanical fall: Patient presented following a mechanical fall, complicated by right femoral periprosthetic fracture. Dr Durene Romans provided orthopedic consultation, and patient is status post revision and conversion of failed right total hip surgery due to non-union of complex proximal  femoral fracture, on 10/05/2012. Post operatively, clinical condition remained satisfactory. Wound vac was discontinued on 10/07/2012. Recommended up with physiotherapy, 50% weight bearing on the right leg. SNF recommended.  2. 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 Dr Dominica Severin provided consultation, and patient is s/p removal of hardware, irrigation and debridement on 10/02/2012. Dr Cliffton Asters provided initial ID consultation, and patient was initially managed with iv Vancomycin/Cefepime. He was subsequently seen by Dr Johny Sax, ID, wound culture grew Enterobacter Cloaca and patient was transitioned to Ceftriaxone monotherapy. PICC line was placed on 10/04/2012. Patient will need 42 days of antibiotics from 10/06/2012, to be concluded on 11/16/12. 50% weight bearing on the right arm, allowed. Per orthopedic surgeon, will need grafting of the defect at a later date. Close follow up in office has been recommended by Dr Amanda Pea.  3. Normocytic anemia: This was due to ABLA from surgery. Patient is s/p transfusion of total of 6 units PRBC on 10/05/12. HB was 7.7 on 10/08/12, and a further 2 units of PRBCs, transfused, with a satisfactory bump in hemoglobin to 8.7.  4. E. Coli UTI: This was an incidental finding, based on a positive urinary sediment. Urine culture revealed E. Coli. Treated with a brief course of Rocephin 12/20-12/22.  5. Atrial fibrillation: Patient has a known history of atrial fibrillation, and was on Xarelto, pre-admission, which was held peri-operatively. He remained in SR on EKG on and telemetry during his hospitalization, on Cardizem and Amiodarone. Patient was on Lovenox until 10/05/2012. Restarted on Xarelto, as of 10/07/12.  6. HTN: Controlled/Stable on pre-admission  antihypertensives.  7. Hypothyroidism: TSH was elevated at 7.848 on admission. Have increased Synthroid from 200 mcg daily, to 225 mcg daily. Will need repeat TSH in 4-6 weeks, by PMD.  8. Obstructive sleep apnea: Continued on nocturnal CPAP. Not problematic.  9. Hyponatremia: Etiology unclear. Likely related to pain and/or dysthyroidism. Appears to be chronic and stable.   Procedures:  See Below.   Consultations: Orthopedics Dr. Amanda Pea and Dr. Charlann Boxer.  ID, Dr. Campbell/Dr. Ninetta Lights.   Discharge Exam: Filed Vitals:   10/09/12 2302 10/10/12 0031 10/10/12 0517 10/10/12 1334  BP: 156/69  197/69 156/70  Pulse:  79 78 82  Temp:   97.7 F (36.5 C) 98.1 F (36.7 C)  TempSrc:    Oral  Resp:  16 16 20   Height:      Weight:      SpO2:  95% 94% 94%    General: Comfortable, alert, communicative, fully oriented, not short of breath at rest.  HEENT: Moderate clinical pallor, no jaundice, no conjunctival injection or discharge.  NECK: Supple, JVP not seen, no carotid bruits, no palpable lymphadenopathy, no palpable goiter.  CHEST: Clinically clear to auscultation, no wheezes, no crackles.  HEART: Sounds 1 and 2 heard, normal, regular, no murmurs.  ABDOMEN: Obese, soft, non-tender, no palpable organomegaly, no palpable masses, normal bowel sounds.  GENITALIA: Not examined.  UPPER EXTREMITIES: RUE is in cast.  LOWER EXTREMITIES: Right hip surgical site, has clean and dry dressings. No pitting edema, palpable peripheral pulses.  MUSCULOSKELETAL SYSTEM: Generalized osteoarthritic changes, otherwise, normal.  CENTRAL NERVOUS SYSTEM: No focal neurologic deficit on gross examination.  Discharge Instructions      Discharge Orders    Future Appointments: Provider: Department: Dept Phone: Center:   11/02/2012 2:00 PM Cliffton Asters, MD [facility] [phone] [facility]     Future Orders Please Complete By Expires   Diet - low sodium heart healthy      Diet - low  sodium heart healthy      Call MD / Call 911      Comments:   If you experience chest pain or shortness of breath, CALL 911 and be transported to the hospital emergency room.  If you develope a fever above 101 F, pus (white drainage) or increased drainage or redness at the wound, or calf pain, call your surgeon's office.   Discharge instructions      Comments:   Dressing changes daily or as needed with 4x4 gauze and tape. Keep the area dry and clean until follow up. Call with any questions or concerns.   Constipation Prevention      Comments:   Drink plenty of fluids.  Prune juice may be helpful.  You may use a stool softener, such as Colace (over the counter) 100 mg twice a day.  Use MiraLax (over the counter) for constipation as needed.   Driving restrictions      Comments:   No driving for 4 weeks   Partial weight bearing      Scheduling Instructions:   50% weight bearing on the right leg   Increase activity slowly          Medication List     As of 10/10/2012  2:01 PM    STOP taking these medications         oxyCODONE-acetaminophen 7.5-325 MG per tablet   Commonly known as: PERCOCET      TAKE these medications         amiodarone  100 MG tablet   Commonly known as: PACERONE   Take 100 mg by mouth daily.      atorvastatin 80 MG tablet   Commonly known as: LIPITOR   Take 80 mg by mouth daily.      clonazePAM 2 MG tablet   Commonly known as: KLONOPIN   Take 1 tablet (2 mg total) by mouth 3 (three) times daily.      clorazepate 15 MG tablet   Commonly known as: TRANXENE   Take 1 tablet (15 mg total) by mouth daily as needed for anxiety.      dextrose 5 % SOLN 50 mL with cefTRIAXone 2 G SOLR 2 g   Inject 2 g into the vein daily.      diltiazem 180 MG 24 hr capsule   Commonly known as: DILACOR XR   Take 180 mg by mouth daily.      doxycycline 100 MG tablet   Commonly known as: VIBRA-TABS   Take 100 mg by mouth daily.      DSS 100 MG Caps   Take 100 mg by mouth  2 (two) times daily.      DULoxetine 60 MG capsule   Commonly known as: CYMBALTA   Take 60 mg by mouth daily.      etanercept 50 MG/ML injection   Commonly known as: ENBREL   Inject 50 mg into the skin once a week. Takes on Mondays      ferrous sulfate 325 (65 FE) MG tablet   Take 1 tablet (325 mg total) by mouth 3 (three) times daily after meals.      levothyroxine 75 MCG tablet   Commonly known as: SYNTHROID, LEVOTHROID   Take 3 tablets (225 mcg total) by mouth daily before breakfast.      methocarbamol 500 MG tablet   Commonly known as: ROBAXIN   Take 1 tablet (500 mg total) by mouth every 6 (six) hours as needed (muscle spasms).      multivitamin with minerals Tabs   Take 1 tablet by mouth daily.      omeprazole 20 MG capsule   Commonly known as: PRILOSEC   Take 20 mg by mouth daily.      oxyCODONE 5 MG immediate release tablet   Commonly known as: Oxy IR/ROXICODONE   Take 1-3 tablets (5-15 mg total) by mouth every 4 (four) hours as needed for pain.      oxyCODONE 10 MG 12 hr tablet   Commonly known as: OXYCONTIN   Take 1 tablet (10 mg total) by mouth every 12 (twelve) hours as needed. For pain      polyethylene glycol packet   Commonly known as: MIRALAX / GLYCOLAX   Take 17 g by mouth daily.      PROBIOTIC PO   Take 1 tablet by mouth daily.      ranolazine 500 MG 12 hr tablet   Commonly known as: RANEXA   Take 500 mg by mouth 2 (two) times daily.      Tamsulosin HCl 0.4 MG Caps   Commonly known as: FLOMAX   Take 0.4 mg by mouth daily.      VITAMIN C PO   Take 1 tablet by mouth daily.      Vitamin D 2000 UNITS Caps   Take 1 capsule by mouth daily.      XARELTO 20 MG Tabs   Generic drug: Rivaroxaban   Take 20 mg by mouth daily with supper.  Follow-up Information    Follow up with Shelda Pal, MD. Schedule an appointment as soon as possible for a visit in 2 weeks.   Contact information:   [address] [place]  [address] [phone]       Follow up with Karen Chafe, MD. Schedule an appointment as soon as possible for a visit on 10/16/2012.   Contact information:   [address] [place] [address] [phone]       Schedule an appointment as soon as possible for a visit with Johny Sax, MD.   Contact information:   [address]  [address] [place] [address] [phone]       Follow up with [facility], MD. (As needed)    Contact information:   [address] [place] [address] [phone]           The results of significant diagnostics from this hospitalization (including imaging, microbiology, ancillary and laboratory) are listed below for reference.    Significant Diagnostic Studies: Dg Pelvis 1-2 Views  09/28/2012  *RADIOLOGY REPORT*  Clinical Data: Fall, leg pain  PELVIS - 1-2 VIEW  Comparison: CT right hip dated 08/23/2012  Findings: Stable appearance of prior intertrochanteric right hip fracture.  Status post ORIF.  The dynamic hip screw is now malpositioned and extends out of the femoral head.  IMPRESSION: Status post ORIF of an intertrochanteric right hip fracture.  Malpositioned dynamic hip screw which now extends out of the femoral head.   Original Report Authenticated By: Charline Bills, M.D.    Dg Femur Right  09/28/2012  **ADDENDUM** CREATED: 09/28/2012 23:39:55  Also noted is an oblique proximal metaphyseal periprosthetic fracture which is new from the prior CT.  The fracture line extends from the lateral proximal femoral shaft to the medial proximal/mid femoral shaft.  This was discussed with the ED on 09/28/2012 at 2340 hrs.  **END ADDENDUM** SIGNED BY: Charline Bills, M.D.   09/28/2012  *RADIOLOGY REPORT*  Clinical Data: Fall, right leg pain  RIGHT FEMUR - 2 VIEW  Comparison: CT right hip dated 08/23/2012  Findings: Status post ORIF of an intertrochanteric right hip fracture with associated  callus/right upper opacification.  Dynamic hip screw is malpositioned and now extends out of the femoral head.  Right total knee arthroplasty.  IMPRESSION: Status post ORIF of an intertrochanteric right hip fracture.  Malpositioned dynamic hip screw now extends out of the femoral head.  Right total knee arthroplasty.  Original Report Authenticated By: Charline Bills, M.D.    Dg Pelvis Portable  10/06/2012  *RADIOLOGY REPORT*  Clinical Data: Postoperative radiograph, status post total right hip arthroplasty.  PORTABLE PELVIS  Comparison: Pelvis radiograph performed 09/28/2012  Findings: There has been successful placement of a total right hip arthroplasty, in expected position, without evidence of new fracture or loosening.  Prominent heterotopic bone formation is again noted about the comminuted fracture at the proximal femoral diaphysis and femoral trochanters.  Visualized cerclage wires appear grossly intact.  The left hip joint is unremarkable in appearance.  Mild sclerotic change is seen at the sacroiliac joints.  Mild degenerative change is noted at the lower lumbar spine.  The visualized bowel gas pattern is grossly unremarkable.  IMPRESSION: Right total hip arthroplasty is grossly unremarkable in appearance, without evidence of new fracture or loosening.  Prominent heterotopic bone formation again noted about the comminuted fracture at the proximal femoral diaphysis and femoral trochanters.   Original  Report Authenticated By: Tonia Ghent, M.D.    Dg Hip Portable 1 View Right  10/05/2012  *RADIOLOGY REPORT*  Clinical Data: Right hip surgery  PORTABLE RIGHT HIP - 1 VIEW  Comparison: Earlier film of the same day  Findings: This second intraoperative radiograph shows placement of additional cerclage wires about the oblique fracture of the proximal femoral shaft, fragments in near anatomic alignment. Femoral and acetabular components of hip arthroplasty project in expected location.  Component of the  knee arthroplasty is partially seen.  IMPRESSION: Right hip arthroplasty with cerclage fixation of proximal femoral shaft oblique fracture   Original Report Authenticated By: D. Andria Rhein, MD    Dg Hip Portable 1 View Right  10/05/2012  *RADIOLOGY REPORT*  Clinical Data: The hip replacement surgery  PORTABLE RIGHT HIP - 1 VIEW  Comparison: 09/28/2012  Findings: Single intraoperative image documents removal of the IM rod.  Acetabular component hip arthroplasty projects in expected location.  Femoral component spacer is noted, incompletely visualized.  There is a cerclage wire around the mid femoral shaft.  IMPRESSION:  Removal of IM rod and changes of right hip replacement surgery.   Original Report Authenticated By: D. Andria Rhein, MD     Microbiology: Recent Results (from the past 240 hour(s))  SURGICAL PCR SCREEN     Status: Normal   Collection Time   09/30/12  7:58 PM      Component Value Range Status Comment   MRSA, PCR NEGATIVE  NEGATIVE Final    Staphylococcus aureus NEGATIVE  NEGATIVE Final   MRSA PCR SCREENING     Status: Normal   Collection Time   10/02/12  3:25 PM      Component Value Range Status Comment   MRSA by PCR NEGATIVE  NEGATIVE Final   BODY FLUID CULTURE     Status: Normal   Collection Time   10/02/12  6:29 PM      Component Value Range Status Comment   Specimen Description FLUID RIGHT WRIST 1   Final    Special Requests NONE   Final    Gram Stain     Final    Value: RARE WBC PRESENT, PREDOMINANTLY MONONUCLEAR     NO ORGANISMS SEEN   Culture     Final    Value: FEW ENTEROBACTER CLOACAE     Note: CRITICAL RESULT CALLED TO, READ BACK BY AND VERIFIED WITH: DANNA FRANKLIN@0923  ON [id number] BY [facility]   Report Status 10/05/2012 FINAL   Final    Organism ID, Bacteria ENTEROBACTER CLOACAE   Final   ANAEROBIC CULTURE     Status: Normal   Collection Time   10/02/12  6:29 PM      Component Value Range Status Comment   Specimen Description FLUID RIGHT WRIST 1   Final     Special Requests NONE   Final    Gram Stain     Final    Value: RARE WBC PRESENT, PREDOMINANTLY MONONUCLEAR     NO ORGANISMS SEEN   Culture NO ANAEROBES ISOLATED   Final    Report Status 10/07/2012 FINAL   Final   TISSUE CULTURE     Status: Normal   Collection Time   10/02/12  6:31 PM      Component Value Range Status Comment   Specimen Description WRIST RIGHT 1   Final    Special Requests NONE   Final    Gram Stain     Final    Value: NO WBC SEEN  NO ORGANISMS SEEN   Culture FEW ENTEROBACTER CLOACAE   Final    Report Status 10/06/2012 FINAL   Final    Organism ID, Bacteria ENTEROBACTER CLOACAE   Final   ANAEROBIC CULTURE     Status: Normal   Collection Time   10/02/12  6:31 PM      Component Value Range Status Comment   Specimen Description WRIST RIGHT 1   Final    Special Requests NONE   Final    Gram Stain     Final    Value: NO WBC SEEN     NO ORGANISMS SEEN   Culture NO ANAEROBES ISOLATED   Final    Report Status 10/07/2012 FINAL   Final   AFB CULTURE WITH SMEAR     Status: Normal (Preliminary result)   Collection Time   10/02/12  6:31 PM      Component Value Range Status Comment   Specimen Description WRIST RIGHT 1   Final    Special Requests NONE   Final    ACID FAST SMEAR NO ACID FAST BACILLI SEEN   Final    Culture     Final    Value: CULTURE WILL BE EXAMINED FOR 6 WEEKS BEFORE ISSUING A FINAL REPORT   Report Status PENDING   Incomplete   FUNGUS CULTURE W SMEAR     Status: Normal (Preliminary result)   Collection Time   10/02/12  6:31 PM      Component Value Range Status Comment   Specimen Description WRIST RIGHT 1   Final    Special Requests NONE   Final    Fungal Smear NO YEAST OR FUNGAL ELEMENTS SEEN   Final    Culture CULTURE IN PROGRESS FOR FOUR WEEKS   Final    Report Status PENDING   Incomplete   BODY FLUID CULTURE     Status: Normal   Collection Time   10/02/12  6:48 PM      Component Value Range Status Comment   Specimen Description FLUID RIGHT  WRIST 2   Final    Special Requests NONE   Final    Gram Stain     Final    Value: RARE WBC PRESENT,BOTH PMN AND MONONUCLEAR     NO ORGANISMS SEEN   Culture     Final    Value: FEW ENTEROBACTER CLOACAE     Note: SUSCEPTIBILITIES PERFORMED ON PREVIOUS CULTURE WITHIN THE LAST 5 DAYS. CRITICAL RESULT CALLED TO, READ BACK BY AND VERIFIED WITH: DANNA FRANKLIN@0923  ON [id number] BY [facility]   Report Status 10/05/2012 FINAL   Final   ANAEROBIC CULTURE     Status: Normal   Collection Time   10/02/12  6:48 PM      Component Value Range Status Comment   Specimen Description FLUID RIGHT WRIST 2   Final    Special Requests NONE   Final    Gram Stain     Final    Value: RARE WBC PRESENT,BOTH PMN AND MONONUCLEAR     NO ORGANISMS SEEN   Culture NO ANAEROBES ISOLATED   Final    Report Status 10/07/2012 FINAL   Final   TISSUE CULTURE     Status: Normal   Collection Time   10/02/12  6:51 PM      Component Value Range Status Comment   Specimen Description WRIST RIGHT 2   Final    Special Requests NONE   Final    Gram Stain  Final    Value: NO WBC SEEN     NO ORGANISMS SEEN   Culture NO GROWTH 3 DAYS   Final    Report Status 10/06/2012 FINAL   Final   ANAEROBIC CULTURE     Status: Normal   Collection Time   10/02/12  6:51 PM      Component Value Range Status Comment   Specimen Description WRIST RIGHT 2   Final    Special Requests NONE   Final    Gram Stain     Final    Value: NO WBC SEEN     NO ORGANISMS SEEN   Culture NO ANAEROBES ISOLATED   Final    Report Status 10/07/2012 FINAL   Final   AFB CULTURE WITH SMEAR     Status: Normal (Preliminary result)   Collection Time   10/02/12  6:51 PM      Component Value Range Status Comment   Specimen Description WRIST RIGHT 2   Final    Special Requests NONE   Final    ACID FAST SMEAR NO ACID FAST BACILLI SEEN   Final    Culture     Final    Value: CULTURE WILL BE EXAMINED FOR 6 WEEKS BEFORE ISSUING A FINAL REPORT   Report Status PENDING    Incomplete   BODY FLUID CULTURE     Status: Normal   Collection Time   10/02/12  7:34 PM      Component Value Range Status Comment   Specimen Description FLUID RIGHT WRIST 3   Final    Special Requests NONE   Final    Gram Stain     Final    Value: RARE WBC PRESENT,BOTH PMN AND MONONUCLEAR     NO ORGANISMS SEEN   Culture     Final    Value: ENTEROBACTER CLOACAE     Note: SUSCEPTIBILITIES PERFORMED ON PREVIOUS CULTURE WITHIN THE LAST 5 DAYS. CRITICAL RESULT CALLED TO, READ BACK BY AND VERIFIED WITH: DANNA FRANKLIN@0923  ON [id number] BY [facility]   Report Status 10/05/2012 FINAL   Final   ANAEROBIC CULTURE     Status: Normal   Collection Time   10/02/12  7:34 PM      Component Value Range Status Comment   Specimen Description FLUID RIGHT WRIST 3   Final    Special Requests NONE   Final    Gram Stain     Final    Value: RARE WBC PRESENT,BOTH PMN AND MONONUCLEAR     NO ORGANISMS SEEN   Culture NO ANAEROBES ISOLATED   Final    Report Status 10/07/2012 FINAL   Final      Labs: Basic Metabolic Panel:  Lab 10/10/12 9147 10/09/12 0430 10/08/12 0530 10/07/12 0558 10/06/12 0530  NA 133* 132* 132* 130* 130*  K 3.5 3.4* 3.6 3.9 4.7  CL 101 99 97 97 95*  CO2 26 27 28 28 27   GLUCOSE 129* 123* 128* 148* 185*  BUN 22 22 25* 25* 22  CREATININE 0.81 0.83 0.79 0.81 0.85  CALCIUM 8.2* 8.0* 7.9* 8.1* 8.8  MG -- -- -- -- --  PHOS -- -- -- -- --   Liver Function Tests: No results found for this basename: AST:5,ALT:5,ALKPHOS:5,BILITOT:5,PROT:5,ALBUMIN:5 in the last 168 hours No results found for this basename: LIPASE:5,AMYLASE:5 in the last 168 hours No results found for this basename: AMMONIA:5 in the last 168 hours CBC:  Lab 10/10/12 0605 10/09/12 0430 10/08/12 0530 10/07/12 0558 10/06/12  1240  WBC 7.0 8.2 10.0 9.6 10.2  NEUTROABS -- -- -- -- --  HGB 8.7* 8.6* 7.7* 8.7* 9.3*  HCT 26.8* 26.2* 23.3* 26.1* 26.7*  MCV 85.4 85.1 85.0 83.7 81.9  PLT 187 164 152 170 186   Cardiac Enzymes: No  results found for this basename: CKTOTAL:5,CKMB:5,CKMBINDEX:5,TROPONINI:5 in the last 168 hours BNP: BNP (last 3 results) No results found for this basename: PROBNP:3 in the last 8760 hours CBG: No results found for this basename: GLUCAP:5 in the last 168 hours     Signed:  Zeplin Aleshire,CHRISTOPHER  [facility] 10/10/2012, 2:01 PM

## 2012-10-09 LAB — CBC
MCH: 27.9 pg (ref 26.0–34.0)
MCV: 85.1 fL (ref 78.0–100.0)
Platelets: 164 10*3/uL (ref 150–400)
RDW: 16.8 % — ABNORMAL HIGH (ref 11.5–15.5)
WBC: 8.2 10*3/uL (ref 4.0–10.5)

## 2012-10-09 LAB — TYPE AND SCREEN
ABO/RH(D): A POS
Antibody Screen: NEGATIVE
Unit division: 0

## 2012-10-09 LAB — BASIC METABOLIC PANEL
Calcium: 8 mg/dL — ABNORMAL LOW (ref 8.4–10.5)
Chloride: 99 mEq/L (ref 96–112)
Creatinine, Ser: 0.83 mg/dL (ref 0.50–1.35)
GFR calc Af Amer: >90 mL/min (ref >90)

## 2012-10-09 MED ORDER — POTASSIUM CHLORIDE CRYS ER 20 MEQ PO TBCR
40.0000 meq | EXTENDED_RELEASE_TABLET | Freq: Once | ORAL | Status: AC
  Administered 2012-10-09: 40 meq via ORAL
  Filled 2012-10-09: qty 2

## 2012-10-09 NOTE — Progress Notes (Signed)
Patient ID: [name], male   DOB: 04-Jun-1946, 66 y.o.   MRN: [id number] Subjective: 4 Days Post-Op Procedure(s) (LRB): TOTAL HIP REVISION (Right) HARDWARE REMOVAL (Right)    Patient reports pain as mild to moderate.  He reviewed with me the past few days, seems to be making steady progress  Objective:   VITALS:   Filed Vitals:   10/09/12 1600  BP:   Pulse:   Temp:   Resp: 18    Neurovascular intact Incision: dressing C/D/I  LABS  Basename 10/09/12 0430 10/08/12 0530 10/07/12 0558  HGB 8.6* 7.7* 8.7*  HCT 26.2* 23.3* 26.1*  WBC 8.2 10.0 9.6  PLT 164 152 170     Basename 10/09/12 0430 10/08/12 0530 10/07/12 0558  NA 132* 132* 130*  K 3.4* 3.6 3.9  BUN 22 25* 25*  CREATININE 0.83 0.79 0.81  GLUCOSE 123* 128* 148*    No results found for this basename: LABPT:2,INR:2 in the last 72 hours   Assessment/Plan: 4 Days Post-Op Procedure(s) (LRB): TOTAL HIP REVISION (Right) HARDWARE REMOVAL (Right)   Up with therapy, PWB RLE Discharge to SNF tomorrow

## 2012-10-09 NOTE — Progress Notes (Addendum)
[facility] PROGRESS NOTE  CURBY CARSWELL QMV:[id number] DOB: 11-10-1945 DOA: 09/28/2012 PCP: Ebbie Ridge, MD  Assessment/Plan: Principal Problem:  *Right femoral fracture Active Problems:  Right forearm fracture  Atrial fibrillation  Hypothyroidism  History of recurrent UTIs  Normocytic anemia  Low testosterone, possible hypogonadism  HTN (hypertension)  Psoriasis  Dyslipidemia  Vitamin D deficiency  GERD (gastroesophageal reflux disease)  Hyperglycemia  Anxiety  Depression  CAD (coronary artery disease)  DJD (degenerative joint disease)  Chronic gingivitis  Anemia    1. Right femoral fracture s/p mechanical fall: Patient presented following a mechanical fall, complicated by right femoral periprosthetic fracture. Dr Durene Romans provided orthopedic consultation, and patient is status post revision and conversion of failed right total hip surgery due to non-union of complex proximal femoral fracture, on 10/05/2012. Post operatively, condition has remained satisfactory. Wound vac was discontinued on 10/07/2012. Recommended up with therapy, 50% weight bearing on both the right leg and right arm. Continue PT. SNF recommended.  2. 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 Dr Dominica Severin privided consultation, and patient is s/p removal of hardware, irrigation and debridement on 10/02/2012. Dr Cliffton Asters provided initial ID consultation, and patient was initially managed with iv Vancomycin/Cefepime. Patrient was subsequently seen by Dr Johny Sax, ID, wound culture grew Enterobacter Cloaca and patient was transitioned to Ceftriaxone monotherapy. PICC line was placed on 10/04/2012. Patient will need 42 days of antibiotics from 10/06/2012. Per orthopedic  surgeon, will need grafting of the defect at a later date. Dr Amanda Pea plans to change dressing today.  3. Normocytic anemia: This was due to ABLA from surgery. Patient is s/p transfusion of total of 6 units PRBC on 10/05/12. HB was 7.7 on 10/08/12, so a further 2 units of PRBC was tranfused on that date. On iron supplements.  4. E. Coli UTI: This was an incidental finding, based on a positive urinary sediment. Urine culture revealed E. Coli. Treated with a brief course of Rocephin 09/29/12-10/01/12.  5. Atrial fibrillation:  Patient has a known history of atrial fibrillation, and was on Xarelto, pre-admission, which was held peri-operatively. Stable, in SR on EKG on admission and telemetry on Cardizem and Amiodarone. Was on Lovenox until 10/05/2012. Restarted back on Xarelto on 10/07/12. 6. HTN: Controlled/Stable on pre-admission antihypertensives.  7. Hypothyroidism: TSH was elevated at 7.848 on admission. Have increased Synthroid from 200 mcg daily, to 225 mcg daily. Will need repeat TSH in 4-6 weeks.  8. Obstructive sleep apnea:  Continued on nocturnal CPAP. Not problematic.  9. Hyponatremia: Etiology unclear. Likely related to pain and/or dysthyroidism. Appears to be chronic.   Code Status: Full Code.  Family Communication:  Disposition Plan: To be determined. Aiming SNF on 10/09/12.    Brief narrative: 66 year old male with history of hypertension, atrial fibrillation, hyperlipidemia, hypothyroidism, brought to the ED, following a fall at his house and right hip pain. X-rays reveal a periprosthetic right hip fracture. Patient has had a fracture of the same area in 05/2012, repaired surgically. He states that he slipped and fell did not lose consciousness. Admitted for further management.    Consultants: Orthopedics Dr. Amanda Pea and Dr. Charlann Boxer  ID, Dr. Campbell/Dr. Ninetta Lights   Procedures: Removal of hardware, irrigation and debridement of the right forearm on  10/02/2012.   Antibiotics: Rocephin 09/29/12-10/01/12  Doxycycline. Commenced prior to admission.  Cefazolin 10/03/2012-10/03/2012  Vancomycin 10/03/2012-10/06/2012  Cefepime 10/03/2012-10/06/2012  Ceftriaxone 10/06/2012 >> (42 more days from 10/06/2012)  HPI/Subjective: No new issues. Moved bowels today.   Objective: Vital signs in last 24 hours: Temp:  [97.3 F (36.3 C)-98.8 F (37.1 C)] 97.9 F (36.6 C) (12/30 0555) Pulse Rate:  [78-91] 78  (12/30 0555) Resp:  [18-24] 18  (12/30 0800) BP: (117-161)/(58-75) 143/75 mmHg (12/30 0555) SpO2:  [91 %-97 %] 97 % (12/30 0800) Weight change:  Last BM Date: 10/09/12  Intake/Output from previous day: 12/29 0701 - 12/30 0700 In: 1964 [P.O.:120; I.V.:754; Blood:1090] Out: 1661 [Urine:1660; Stool:1] Total I/O In: 360 [P.O.:360] Out: -    Physical Exam: General: Comfortable, alert, communicative, fully oriented, not short of breath at rest.  HEENT:  Mild clinical pallor, no jaundice, no conjunctival injection or discharge. NECK:  Supple, JVP not seen, no carotid bruits, no palpable lymphadenopathy, no palpable goiter. CHEST:  Clinically clear to auscultation, no wheezes, no crackles. HEART:  Sounds 1 and 2 heard, normal, regular, no murmurs. ABDOMEN:  Obese, soft, non-tender, no palpable organomegaly, no palpable masses, normal bowel sounds. GENITALIA:  Not examined. UPPER EXTREMITIES: RUE is heavily bandaged.  LOWER EXTREMITIES:  Right hip surgical site, has clean and dry dressings. No pitting edema, palpable peripheral pulses. MUSCULOSKELETAL SYSTEM:  Generalized osteoarthritic changes, otherwise, normal. CENTRAL NERVOUS SYSTEM:  No focal neurologic deficit on gross examination.  Lab Results:  Basename 10/09/12 0430 10/08/12 0530  WBC 8.2 10.0  HGB 8.6* 7.7*  HCT 26.2* 23.3*  PLT 164 152    Basename 10/09/12 0430 10/08/12 0530  NA 132* 132*  K 3.4* 3.6  CL 99 97  CO2 27 28  GLUCOSE 123* 128*  BUN 22 25*   CREATININE 0.83 0.79  CALCIUM 8.0* 7.9*   Recent Results (from the past 240 hour(s))  SURGICAL PCR SCREEN     Status: Normal   Collection Time   09/30/12  7:58 PM      Component Value Range Status Comment   MRSA, PCR NEGATIVE  NEGATIVE Final    Staphylococcus aureus NEGATIVE  NEGATIVE Final   MRSA PCR SCREENING     Status: Normal   Collection Time   10/02/12  3:25 PM      Component Value Range Status Comment   MRSA by PCR NEGATIVE  NEGATIVE Final   BODY FLUID CULTURE     Status: Normal   Collection Time   10/02/12  6:29 PM      Component Value Range Status Comment   Specimen Description FLUID RIGHT WRIST 1   Final    Special Requests NONE   Final    Gram Stain     Final    Value: RARE WBC PRESENT, PREDOMINANTLY MONONUCLEAR     NO ORGANISMS SEEN   Culture     Final    Value: FEW ENTEROBACTER CLOACAE     Note: CRITICAL RESULT CALLED TO, READ BACK BY AND VERIFIED WITH: DANNA FRANKLIN@0923  ON [id number] BY [facility]   Report Status 10/05/2012 FINAL   Final    Organism ID, Bacteria ENTEROBACTER CLOACAE   Final   ANAEROBIC CULTURE     Status: Normal   Collection Time   10/02/12  6:29 PM      Component Value Range Status Comment   Specimen Description FLUID RIGHT WRIST 1   Final    Special Requests NONE   Final    Gram Stain     Final    Value: RARE  WBC PRESENT, PREDOMINANTLY MONONUCLEAR     NO ORGANISMS SEEN   Culture NO ANAEROBES ISOLATED   Final    Report Status 10/07/2012 FINAL   Final   TISSUE CULTURE     Status: Normal   Collection Time   10/02/12  6:31 PM      Component Value Range Status Comment   Specimen Description WRIST RIGHT 1   Final    Special Requests NONE   Final    Gram Stain     Final    Value: NO WBC SEEN     NO ORGANISMS SEEN   Culture FEW ENTEROBACTER CLOACAE   Final    Report Status 10/06/2012 FINAL   Final    Organism ID, Bacteria ENTEROBACTER CLOACAE   Final   ANAEROBIC CULTURE     Status: Normal   Collection Time   10/02/12  6:31 PM      Component  Value Range Status Comment   Specimen Description WRIST RIGHT 1   Final    Special Requests NONE   Final    Gram Stain     Final    Value: NO WBC SEEN     NO ORGANISMS SEEN   Culture NO ANAEROBES ISOLATED   Final    Report Status 10/07/2012 FINAL   Final   AFB CULTURE WITH SMEAR     Status: Normal (Preliminary result)   Collection Time   10/02/12  6:31 PM      Component Value Range Status Comment   Specimen Description WRIST RIGHT 1   Final    Special Requests NONE   Final    ACID FAST SMEAR NO ACID FAST BACILLI SEEN   Final    Culture     Final    Value: CULTURE WILL BE EXAMINED FOR 6 WEEKS BEFORE ISSUING A FINAL REPORT   Report Status PENDING   Incomplete   FUNGUS CULTURE W SMEAR     Status: Normal (Preliminary result)   Collection Time   10/02/12  6:31 PM      Component Value Range Status Comment   Specimen Description WRIST RIGHT 1   Final    Special Requests NONE   Final    Fungal Smear NO YEAST OR FUNGAL ELEMENTS SEEN   Final    Culture CULTURE IN PROGRESS FOR FOUR WEEKS   Final    Report Status PENDING   Incomplete   BODY FLUID CULTURE     Status: Normal   Collection Time   10/02/12  6:48 PM      Component Value Range Status Comment   Specimen Description FLUID RIGHT WRIST 2   Final    Special Requests NONE   Final    Gram Stain     Final    Value: RARE WBC PRESENT,BOTH PMN AND MONONUCLEAR     NO ORGANISMS SEEN   Culture     Final    Value: FEW ENTEROBACTER CLOACAE     Note: SUSCEPTIBILITIES PERFORMED ON PREVIOUS CULTURE WITHIN THE LAST 5 DAYS. CRITICAL RESULT CALLED TO, READ BACK BY AND VERIFIED WITH: DANNA FRANKLIN@0923  ON [id number] BY [facility]   Report Status 10/05/2012 FINAL   Final   ANAEROBIC CULTURE     Status: Normal   Collection Time   10/02/12  6:48 PM      Component Value Range Status Comment   Specimen Description FLUID RIGHT WRIST 2   Final    Special Requests NONE   Final  Gram Stain     Final    Value: RARE WBC PRESENT,BOTH PMN AND MONONUCLEAR     NO  ORGANISMS SEEN   Culture NO ANAEROBES ISOLATED   Final    Report Status 10/07/2012 FINAL   Final   TISSUE CULTURE     Status: Normal   Collection Time   10/02/12  6:51 PM      Component Value Range Status Comment   Specimen Description WRIST RIGHT 2   Final    Special Requests NONE   Final    Gram Stain     Final    Value: NO WBC SEEN     NO ORGANISMS SEEN   Culture NO GROWTH 3 DAYS   Final    Report Status 10/06/2012 FINAL   Final   ANAEROBIC CULTURE     Status: Normal   Collection Time   10/02/12  6:51 PM      Component Value Range Status Comment   Specimen Description WRIST RIGHT 2   Final    Special Requests NONE   Final    Gram Stain     Final    Value: NO WBC SEEN     NO ORGANISMS SEEN   Culture NO ANAEROBES ISOLATED   Final    Report Status 10/07/2012 FINAL   Final   AFB CULTURE WITH SMEAR     Status: Normal (Preliminary result)   Collection Time   10/02/12  6:51 PM      Component Value Range Status Comment   Specimen Description WRIST RIGHT 2   Final    Special Requests NONE   Final    ACID FAST SMEAR NO ACID FAST BACILLI SEEN   Final    Culture     Final    Value: CULTURE WILL BE EXAMINED FOR 6 WEEKS BEFORE ISSUING A FINAL REPORT   Report Status PENDING   Incomplete   BODY FLUID CULTURE     Status: Normal   Collection Time   10/02/12  7:34 PM      Component Value Range Status Comment   Specimen Description FLUID RIGHT WRIST 3   Final    Special Requests NONE   Final    Gram Stain     Final    Value: RARE WBC PRESENT,BOTH PMN AND MONONUCLEAR     NO ORGANISMS SEEN   Culture     Final    Value: ENTEROBACTER CLOACAE     Note: SUSCEPTIBILITIES PERFORMED ON PREVIOUS CULTURE WITHIN THE LAST 5 DAYS. CRITICAL RESULT CALLED TO, READ BACK BY AND VERIFIED WITH: DANNA FRANKLIN@0923  ON [id number] BY [facility]   Report Status 10/05/2012 FINAL   Final   ANAEROBIC CULTURE     Status: Normal   Collection Time   10/02/12  7:34 PM      Component Value Range Status Comment   Specimen  Description FLUID RIGHT WRIST 3   Final    Special Requests NONE   Final    Gram Stain     Final    Value: RARE WBC PRESENT,BOTH PMN AND MONONUCLEAR     NO ORGANISMS SEEN   Culture NO ANAEROBES ISOLATED   Final    Report Status 10/07/2012 FINAL   Final      Studies/Results: No results found.  Medications: Scheduled Meds:    . amiodarone  100 mg Oral Daily  . atorvastatin  80 mg Oral Daily  . calcium chloride  1 g Intravenous Once  .  calcium chloride  1 g Intravenous Once  . cefTRIAXone (ROCEPHIN)  IV  2 g Intravenous Q24H  . cholecalciferol  2,000 Units Oral Daily  . clonazePAM  2 mg Oral TID  . diltiazem  180 mg Oral Daily  . docusate sodium  100 mg Oral BID  . doxycycline  100 mg Oral QHS  . DULoxetine  60 mg Oral Daily  . ferrous sulfate  325 mg Oral TID PC  . levothyroxine  225 mcg Oral QAC breakfast  . multivitamin with minerals  1 tablet Oral Daily  . pantoprazole  40 mg Oral Daily  . polyethylene glycol  17 g Oral Daily  . ranolazine  500 mg Oral BID  . Rivaroxaban  20 mg Oral Q supper  . sodium chloride  3 mL Intravenous Q12H  . Tamsulosin HCl  0.4 mg Oral Daily   Continuous Infusions:    . sodium chloride 20 mL/hr (10/07/12 0836)   PRN Meds:.acetaminophen, acetaminophen, HYDROmorphone (DILAUDID) injection, menthol-cetylpyridinium, methocarbamol (ROBAXIN) IV, methocarbamol, metoCLOPramide (REGLAN) injection, metoCLOPramide, ondansetron (ZOFRAN) IV, ondansetron, oxyCODONE, oxyCODONE-acetaminophen, phenol, sodium chloride    LOS: 11 days   Aquila Delaughter,CHRISTOPHER  [facility] Pager [phone]. If 8PM-8AM, please contact night-coverage at [email or web address], password [facility] 10/09/2012, 11:21 AM  LOS: 11 days

## 2012-10-09 NOTE — Progress Notes (Signed)
Clinical Social Worker continuing to follow for discharge planning. Plan is for discharge to [facility] when pt medically stable. Paula Compton, admissions for [facility], completed admission paperwork with pt today. Per unit charge RN, plan is for discharge to [facility] tomorrow 12/31. Clinical Social Worker notified [employer]. Clinical Social Worker to facilitate pt discharge needs when pt medically stable for discharge.  Jacklynn Lewis, MSW, LCSWA  Clinical Social Work [phone]

## 2012-10-09 NOTE — Care Management Note (Unsigned)
    Page 1 of 1   10/09/2012     2:21:40 PM   CARE MANAGEMENT NOTE 10/09/2012  Patient:  JUSTUN, ANAYA   Account Number:  [id number]  Date Initiated:  10/05/2012  Documentation initiated by:  DAVIS,RHONDA  Subjective/Objective Assessment:   pt to or for hip revision [id number]     Action/Plan:   tob   Anticipated DC Date:  10/09/2012   Anticipated DC Plan:  SKILLED NURSING FACILITY  In-house referral  Clinical Social Worker      DC Planning Services  CM consult      Choice offered to / List presented to:             Status of service:  Completed, signed off Medicare Important Message given?  NA - LOS <3 / Initial given by admissions (If response is "NO", the following Medicare IM given date fields will be blank) Date Medicare IM given:   Date Additional Medicare IM given:    Discharge Disposition:    Per UR Regulation:    If discussed at Long Length of Stay Meetings, dates discussed:    Comments:  [id number] Earlene Plater, RN, BSN, CCM: CHART REVIEWED AND UPDATED.  Next chart review due on [id number]. NO DISCHARGE NEEDS PRESENT AT THIS TIME. CASE MANAGEMENT [phone]  [id number] iof failed hip syurg planned for [id number], pt is to go to [facility] when med ready.

## 2012-10-09 NOTE — Progress Notes (Signed)
Patient ID: [name], male   DOB: 06-12-1946, 66 y.o.   MRN: [id number] Patient is seen and examined at bedside.  I removed his bandages and checked all wounds in the right upper extremity. All incisions look clean dry and intact one-week postop status post I&D hardware removal and spanning plate fixation of the wrist. I cleansed the arm and applied Neosporin Adaptic and Xeroform gauze followed by a sugar tong cast well molded to my satisfaction.  He has good early range of motion to the fingers. The flexor pollicis longus to the thumb is poorly firing at this juncture. He is sensate and without signs of active your edema infection problems.  I have discussed with the patient all issues. We would plan for close followup in my office. I would like see him back in a week and remove his stitches and check his wound. I have discussed with him the antibiotics and appreciate the infection disease service. The patient understands he will have IV antibiotics for 42 days followed by repeat labs. I would ultimately like to consider bone grafting and removal of the antibiotic spacer in 2-3 months once we are confident with our wound and his progress.  The patient understands to see me back in my office next Monday and will call for the appointment  He was a pleasure seeing Mr. Holzschuh today  Oletta Cohn.D.

## 2012-10-09 NOTE — Progress Notes (Signed)
Physical Therapy Treatment Patient Details Name: KERRON SEDANO MRN: [id number] DOB: 01/19/1946 Today's Date: 10/09/2012 Time: 4098-1191 PT Time Calculation (min): 28 min  PT Assessment / Plan / Recommendation Comments on Treatment Session  POD # 4 R THR converted from ORIF plus R wrist ORIF POD # 7 s/p fall/fx.  Pt progressing however still requires + 2 for safety.  Pt plans to D/C to [facility].    Follow Up Recommendations  SNF     Does the patient have the potential to tolerate intense rehabilitation     Barriers to Discharge        Equipment Recommendations  None recommended by PT (already has a platform walker)    Recommendations for Other Services    Frequency 7X/week   Plan Discharge plan remains appropriate    Precautions / Restrictions Precautions Precautions: Posterior Hip;Fall Precaution Comments: Pt recalled 1/3 THP so re educated Restrictions Weight Bearing Restrictions: Yes RUE Weight Bearing: Partial weight bearing RUE Partial Weight Bearing Percentage or Pounds: 50% RLE Weight Bearing: Partial weight bearing RLE Partial Weight Bearing Percentage or Pounds: 50%   Pertinent Vitals/Pain C/o 5/10 R hip pain during session ICE applied    Mobility  Bed Mobility Bed Mobility: Sitting - Scoot to Edge of Bed Supine to Sit: 4: Min assist Sitting - Scoot to [place] of Bed: 4: Min assist Details for Bed Mobility Assistance: Min assist to support R LE off bed and increased time Transfers Transfers: Sit to Stand;Stand to Sit Sit to Stand: 1: +2 Total assist Sit to Stand: Patient Percentage: 80% Stand to Sit: To bed;To elevated surface;To toilet Stand to Sit: Patient Percentage: 80% Details for Transfer Assistance: Assist with securing RW as pt tends to push up with L UE and has R casted UE on platform. Ambulation/Gait Ambulation/Gait Assistance: 1: +2 Total assist Ambulation/Gait: Patient Percentage: 80% Ambulation Distance (Feet): 23  Feet Assistive device: Right platform walker Ambulation/Gait Assistance Details: <25% VC's on proper walker to self distance and upright posture Gait Pattern: Step-to pattern;Decreased stance time - right;Trunk flexed Gait velocity: WFL    PT Goals                                                      progressing    Visit Information  Last PT Received On: 10/09/12 Assistance Needed: +2    Subjective Data  Subjective: Don't leave me in this chair too long Patient Stated Goal: Rehab   Cognition    good   Balance   fair  End of Session PT - End of Session Equipment Utilized During Treatment: Gait belt Activity Tolerance: Patient limited by fatigue Patient left: in chair;with call bell/phone within reach   Felecia Shelling  PTA [facility]  Acute  Rehab Pager     [phone]

## 2012-10-09 NOTE — Progress Notes (Signed)
Physical Therapy Treatment Patient Details Name: [name] MRN: [id number] DOB: [birth year] Today's Date: 10/09/2012 Time: 1308-6578 PT Time Calculation (min): 18 min  PT Assessment / Plan / Recommendation Comments on Treatment Session  POD # 4 R THR converted from ORIF plus R wrist ORIF POD # 7 s/p fall/fx.  Pt progressing however still requires + 2 for safety.  Pt plans to D/C to [facility].    Follow Up Recommendations  SNF     Does the patient have the potential to tolerate intense rehabilitation     Barriers to Discharge        Equipment Recommendations  None recommended by PT (already has a platform walker)    Recommendations for Other Services    Frequency 7X/week   Plan Discharge plan remains appropriate    Precautions / Restrictions Precautions Precautions: Posterior Hip;Fall Precaution Comments: Pt recalled 1/3 THP so re educated Restrictions Weight Bearing Restrictions: Yes RUE Weight Bearing: Partial weight bearing RUE Partial Weight Bearing Percentage or Pounds: 50% RLE Weight Bearing: Partial weight bearing RLE Partial Weight Bearing Percentage or Pounds: 50%   Pertinent Vitals/Pain C/o "soreness" ICE applied    Mobility  Assisted back to bed Max assist to support R LE up onto bed  Transfers: Sit to Stand;Stand to Sit Sit to Stand: 1: +2 Total assist Sit to Stand: Patient Percentage: 80% Stand to Sit: To bed;To elevated surface;To toilet Stand to Sit: Patient Percentage: 80% Details for Transfer Assistance: Assist with securing RW as pt tends to push up with L UE and has R casted UE on platform. Amb: 4 steps sideway to bed + 2 assist and 50% VC's to decrease anxiety     PT Goals                                                progressing    Visit Information  Last PT Received On: 10/09/12 Assistance Needed: +2    Subjective Data  Subjective: Don't leave me in this chair too long Patient Stated Goal: Rehab   Cognition     good   Balance   fair  End of Session PT - End of Session Equipment Utilized During Treatment: Gait belt Activity Tolerance: Patient limited by fatigue Patient left: in chair;with call bell/phone within reach   Felecia Shelling  PTA [facility]  Acute  Rehab Pager     [phone]

## 2012-10-10 LAB — BASIC METABOLIC PANEL
Chloride: 101 mEq/L (ref 96–112)
GFR calc Af Amer: >90 mL/min (ref >90)
Potassium: 3.5 mEq/L (ref 3.5–5.1)

## 2012-10-10 LAB — CBC
Platelets: 187 10*3/uL (ref 150–400)
RDW: 16.5 % — ABNORMAL HIGH (ref 11.5–15.5)
WBC: 7 10*3/uL (ref 4.0–10.5)

## 2012-10-10 MED ORDER — METHOCARBAMOL 500 MG PO TABS: 500.0000 mg | ORAL_TABLET | Freq: Four times a day (QID) | ORAL | Status: DC | PRN

## 2012-10-10 MED ORDER — POLYETHYLENE GLYCOL 3350 17 G PO PACK: 17.0000 g | PACK | Freq: Every day | ORAL | Status: DC

## 2012-10-10 MED ORDER — LEVOTHYROXINE SODIUM 75 MCG PO TABS: 225.0000 ug | ORAL_TABLET | Freq: Every day | ORAL | Status: DC

## 2012-10-10 MED ORDER — OXYCODONE HCL 10 MG PO TB12: 10.0000 mg | ORAL_TABLET | Freq: Two times a day (BID) | ORAL | Status: DC | PRN

## 2012-10-10 MED ORDER — DEXTROSE 5 % IV SOLN: 2.0000 g | INTRAVENOUS | Status: AC

## 2012-10-10 MED ORDER — CLORAZEPATE DIPOTASSIUM 15 MG PO TABS: 15.0000 mg | ORAL_TABLET | Freq: Every day | ORAL | Status: DC | PRN

## 2012-10-10 MED ORDER — FERROUS SULFATE 325 (65 FE) MG PO TABS: 325.0000 mg | ORAL_TABLET | Freq: Three times a day (TID) | ORAL | Status: DC

## 2012-10-10 MED ORDER — HEPARIN SOD (PORK) LOCK FLUSH 100 UNIT/ML IV SOLN: 250.0000 [IU] | INTRAVENOUS | Status: DC | PRN

## 2012-10-10 MED ORDER — OXYCODONE HCL 5 MG PO TABS: 5.0000 mg | ORAL_TABLET | ORAL | Status: DC | PRN

## 2012-10-10 MED ORDER — CLONAZEPAM 2 MG PO TABS: 2.0000 mg | ORAL_TABLET | Freq: Three times a day (TID) | ORAL | Status: DC

## 2012-10-10 MED ORDER — DSS 100 MG PO CAPS: 100.0000 mg | ORAL_CAPSULE | Freq: Two times a day (BID) | ORAL | Status: DC

## 2012-10-10 NOTE — Progress Notes (Signed)
Pt planning to d/c to [facility] today. Pt /spouse are aware and in agreement with d/cplan. [facility] has been called for transport ( approximately 1440 ).  Cori Razor LCSW [phone]

## 2012-10-10 NOTE — Progress Notes (Signed)
   Subjective: 5 Days Post-Op Procedure(s) (LRB): TOTAL HIP REVISION (Right) HARDWARE REMOVAL (Right)   Patient reports pain as mild at rest,  increases with motion and sitting. Happy with his new cast on the right arm. Understands his restrictions and knows it is going to take time to get better.  Objective:   VITALS:   Filed Vitals:   10/10/12 0517  BP: 197/69  Pulse: 78  Temp: 97.7 F (36.5 C)  Resp: 16    Neurovascular intact Dorsiflexion/Plantar flexion intact Incision: moderate drainage, expected serous drainage of the right hip incision No cellulitis present Compartment soft  LABS  Basename 10/10/12 0605 10/09/12 0430 10/08/12 0530  HGB 8.7* 8.6* 7.7*  HCT 26.8* 26.2* 23.3*  WBC 7.0 8.2 10.0  PLT 187 164 152     Basename 10/10/12 0605 10/09/12 0430 10/08/12 0530  NA 133* 132* 132*  K 3.5 3.4* 3.6  BUN 22 22 25*  CREATININE 0.81 0.83 0.79  GLUCOSE 129* 123* 128*     Assessment/Plan: 5 Days Post-Op Procedure(s) (LRB): TOTAL HIP REVISION (Right) HARDWARE REMOVAL (Right)  Up with therapy Plan for discharge when ready Orthopaedically stable. Dressing changes to the right hip daily and as needed. 50% weight bearing on the right leg. Continue home Xarelto Rx for Oxycodone written To follow up with Dr. Amanda Pea next Monday Follow up with OLIN,Lowery Paullin D in 2 weeks.  Contact information:  [facility] [address], [address] [place] [address] [phone]        Anastasio Auerbach. Galaxy Borden   PAC  10/10/2012, 7:54 AM

## 2012-10-10 NOTE — Progress Notes (Signed)
CSW following to assist with d/c planning. [facility] has a SNF bed available today if pt is stable for d/c. FL2 has been signed. CSW will assist with d/c to SNF.  Cori Razor LCSW [phone]

## 2012-10-10 NOTE — Progress Notes (Signed)
1445 called [facility] to give report, transferred 3 times, phone rang 20 times, no answer.  Patient ready for transfer D Susann Givens RN

## 2012-10-10 NOTE — Progress Notes (Signed)
Physical Therapy Treatment Patient Details Name: GEOVANIE WINNETT MRN: [id number] DOB: [birth year] Today's Date: 10/10/2012 Time: 1478-2956 PT Time Calculation (min): 29 min  PT Assessment / Plan / Recommendation Comments on Treatment Session  POD # 5 R THR converted from ORIF plus R wrist ORIF POD # 8 s/p fall/fx.  Pt progressing however still requires + 2 for safety.  Pt plans to D/C to [facility] today    Follow Up Recommendations  SNF     Does the patient have the potential to tolerate intense rehabilitation     Barriers to Discharge        Equipment Recommendations  Other (comment) (RIGHT Platform  RW)    Recommendations for Other Services    Frequency 7X/week   Plan Discharge plan remains appropriate    Precautions / Restrictions Precautions Precautions: Posterior Hip;Fall Precaution Comments: cast RUE Restrictions Weight Bearing Restrictions: Yes RUE Weight Bearing: Partial weight bearing (ELBOW ONLY) RUE Partial Weight Bearing Percentage or Pounds: 50 RLE Weight Bearing: Partial weight bearing RLE Partial Weight Bearing Percentage or Pounds: 50   Pertinent Vitals/Pain     Mobility  Bed Mobility Bed Mobility: Supine to Sit;Sitting - Scoot to Edge of Bed Supine to Sit: 4: Min assist Sitting - Scoot to [place] of Bed: 5: Supervision Details for Bed Mobility Assistance: min assist for RLE, facilitation of movement Transfers Transfers: Sit to Stand;Stand to Sit Sit to Stand: 1: +2 Total assist;From bed;From elevated surface Sit to Stand: Patient Percentage: 80% Stand to Sit: 1: +2 Total assist;To chair/3-in-1 Stand to Sit: Patient Percentage: 80% Details for Transfer Assistance: Assist with securing RW as pt tends to push up with L UE and has R casted UE on platform; requires assist to control descent Ambulation/Gait Ambulation/Gait Assistance: 1: +2 Total assist Ambulation/Gait: Patient Percentage: 90% Ambulation Distance (Feet): 45 Feet Assistive device:  Right platform walker Ambulation/Gait Assistance Details: cues for sequence and PWB RUE/LE, chair follow for safety and due to pt increased anxiety with regard to mobility;  Gait Pattern: Step-to pattern;Decreased stance time - right;Trunk flexed Gait velocity: decreased General Gait Details: pt extremely dyspneic with amb; Sats 94% on RA; HR 94 Stairs: No    Exercises     PT Diagnosis:    PT Problem List:   PT Treatment Interventions:     PT Goals Acute Rehab PT Goals Time For Goal Achievement: 10/20/12 Potential to Achieve Goals: Good Pt will go Supine/Side to Sit: with min assist;with rail PT Goal: Supine/Side to Sit - Progress: Met Pt will Transfer Bed to Chair/Chair to Bed: with +2 total assist PT Transfer Goal: Bed to Chair/Chair to Bed - Progress: Met Additional Goals Additional Goal #1: state 3/3 posterior hip precautions PT Goal: Additional Goal #1 - Progress: Partly met  Visit Information  Last PT Received On: 10/10/12 Assistance Needed: +2 PT/OT Co-Evaluation/Treatment: Yes    Subjective Data  Subjective: why will I need clothes at [facility]? Patient Stated Goal: Rehab   Cognition  Overall Cognitive Status: Appears within functional limits for tasks assessed/performed Arousal/Alertness: Awake/alert Orientation Level: Appears intact for tasks assessed Behavior During Session: [facility] for tasks performed    Balance     End of Session PT - End of Session Activity Tolerance: Patient limited by fatigue Patient left: in chair;with call bell/phone within reach;with family/visitor present Nurse Communication: Other (comment) (DOE)   GP     Drucilla Chalet 10/10/2012, 11:11 AM

## 2012-10-10 NOTE — Progress Notes (Signed)
Clinical Social Work Department CLINICAL SOCIAL WORK PLACEMENT NOTE 10/10/2012  Patient:  [name], [name]  Account Number:  [id number] Admit date:  09/28/2012  Clinical Social Worker:  Orpah Greek  Date/time:  10/03/2012 01:29 PM  Clinical Social Work is seeking post-discharge placement for this patient at the following level of care:   SKILLED NURSING   (*CSW will update this form in Epic as items are completed)   10/03/2012  Patient/family provided with [facility] Department of Clinical Social Work's list of facilities offering this level of care within the geographic area requested by the patient (or if unable, by the patient's family).  10/03/2012  Patient/family informed of their freedom to choose among providers that offer the needed level of care, that participate in Medicare, Medicaid or managed care program needed by the patient, have an available bed and are willing to accept the patient.  10/03/2012  Patient/family informed of [facility]' ownership interest in [facility], as well as of the fact that they are under no obligation to receive care at this facility.  PASARR submitted to EDS on 10/03/2012 PASARR number received from EDS on 10/03/2012  FL2 transmitted to all facilities in geographic area requested by pt/family on  10/03/2012 FL2 transmitted to all facilities within larger geographic area on   Patient informed that his/her managed care company has contracts with or will negotiate with  certain facilities, including the following:     Patient/family informed of bed offers received:  10/05/2012 Patient chooses bed at [facility] Physician recommends and patient chooses bed at    Patient to be transferred to [facility] on  10/10/2012 Patient to be transferred to facility by [facility]  The following physician request were entered in Epic:   Additional Comments:  Cori Razor LCSW [phone]

## 2012-10-10 NOTE — Progress Notes (Signed)
Occupational Therapy Treatment Patient Details Name: [name] MRN: [id number] DOB: 19-Jul-1946 Today's Date: 10/10/2012 Time: 1026-1100 OT Time Calculation (min): 34 min  OT Assessment / Plan / Recommendation Comments on Treatment Session POD # 5 R THR converted from ORIF plus R wrist ORIF POD # 8 s/p fall/fx.  Pt progressing however still requires + 2 for safety.  Pt plans to D/C to [facility] today     Follow Up Recommendations  SNF    Barriers to Discharge       Equipment Recommendations  None recommended by OT    Recommendations for Other Services    Frequency Min 2X/week   Plan Discharge plan remains appropriate    Precautions / Restrictions Precautions Precautions: Posterior Hip;Fall Precaution Comments: cast RUE Restrictions Weight Bearing Restrictions: Yes RUE Weight Bearing: Partial weight bearing (ELBOW ONLY) RUE Partial Weight Bearing Percentage or Pounds: 50 RLE Weight Bearing: Partial weight bearing RLE Partial Weight Bearing Percentage or Pounds: 50   Pertinent Vitals/Pain Pt reported 3/10 R hip pain with mobility. Repositioned for comfort.    ADL  Lower Body Bathing: +1 Total assistance Where Assessed - Lower Body Bathing: Supported sit to stand Toilet Transfer: +2 Total assistance Toilet Transfer: Patient Percentage: 80% Toilet Transfer Method: Sit to Barista: Other (comment) (recliner) Equipment Used: Rolling walker ADL Comments: Initiated donning of pants with pt in supine, pulled up around hips while in standing. No reacher available. Pt fatigued very quickly today. 3/4 DOE. O2 sats 94% on RA. Pt used PFRW and was able to bear partial weight through R elbow. While at rest, pt needs to keep it in a slinged position in order to keep it from irritating skin. RN made aware.    OT Diagnosis:    OT Problem List:   OT Treatment Interventions:     OT Goals ADL Goals ADL Goal: Toilet Transfer - Progress: Progressing toward  goals ADL Goal: Additional Goal #1 - Progress: Progressing toward goals  Visit Information  Last OT Received On: 10/03/12 Assistance Needed: +2 PT/OT Co-Evaluation/Treatment: Yes    Subjective Data  Subjective: You mean I have to wear pants all the time at rehab?   Prior Functioning       Cognition  Overall Cognitive Status: Appears within functional limits for tasks assessed/performed Arousal/Alertness: Awake/alert Orientation Level: Appears intact for tasks assessed Behavior During Session: [facility] for tasks performed    Mobility  Shoulder Instructions Bed Mobility Bed Mobility: Supine to Sit;Sitting - Scoot to Edge of Bed Supine to Sit: 4: Min assist Sitting - Scoot to [place] of Bed: 5: Supervision Details for Bed Mobility Assistance: min assist for RLE, facilitation of movement Transfers Sit to Stand: 1: +2 Total assist;From bed;From elevated surface Sit to Stand: Patient Percentage: 80% Stand to Sit: 1: +2 Total assist;To chair/3-in-1 Stand to Sit: Patient Percentage: 80% Details for Transfer Assistance: Assist with securing RW as pt tends to push up with L UE and has R casted UE on platform; requires assist to control descent       Exercises      Balance     End of Session OT - End of Session Equipment Utilized During Treatment: Gait belt Activity Tolerance: Patient limited by fatigue Patient left: in chair;with call bell/phone within reach;with family/visitor present  GO     [name] A OTR/L [phone] 10/10/2012, 12:02 PM

## 2012-10-30 LAB — FUNGUS CULTURE W SMEAR: Fungal Smear: NONE SEEN

## 2012-11-02 ENCOUNTER — Ambulatory Visit (INDEPENDENT_AMBULATORY_CARE_PROVIDER_SITE_OTHER): Payer: Medicare Other | Admitting: Internal Medicine

## 2012-11-02 ENCOUNTER — Encounter: Payer: Self-pay | Admitting: Internal Medicine

## 2012-11-02 VITALS — BP 145/84 | HR 91 | Temp 97.8°F | Ht 76.0 in | Wt 320.0 lb

## 2012-11-02 DIAGNOSIS — M869 Osteomyelitis, unspecified: Secondary | ICD-10-CM

## 2012-11-02 DIAGNOSIS — Z0000 Encounter for general adult medical examination without abnormal findings: Secondary | ICD-10-CM

## 2012-11-02 HISTORY — DX: Osteomyelitis, unspecified: M86.9

## 2012-11-02 NOTE — Progress Notes (Addendum)
Patient ID: [name], male   DOB: [birth year], 67 y.o.   MRN: [id number]    [facility] for Infectious Disease  Patient Active Problem List  Diagnosis  . Right femoral fracture  . Right forearm fracture  . Atrial fibrillation  . Hypothyroidism  . History of recurrent UTIs  . Normocytic anemia  . Low testosterone, possible hypogonadism  . HTN (hypertension)  . Psoriasis  . Dyslipidemia  . Vitamin D deficiency  . GERD (gastroesophageal reflux disease)  . Hyperglycemia  . Anxiety  . Depression  . CAD (coronary artery disease)  . DJD (degenerative joint disease)  . Chronic gingivitis  . Anemia  . Osteomyelitis of right wrist    Patient's Medications  New Prescriptions   No medications on file  Previous Medications   AMIODARONE (PACERONE) 100 MG TABLET    Take 100 mg by mouth daily.   ASCORBIC ACID (VITAMIN C PO)    Take 1 tablet by mouth daily.   ATORVASTATIN (LIPITOR) 80 MG TABLET    Take 80 mg by mouth daily.   CHOLECALCIFEROL (VITAMIN D) 2000 UNITS CAPS    Take 1 capsule by mouth daily.   CLONAZEPAM (KLONOPIN) 2 MG TABLET    Take 1 tablet (2 mg total) by mouth 3 (three) times daily.   CLORAZEPATE (TRANXENE-T) 15 MG TABLET    Take 1 tablet (15 mg total) by mouth daily as needed for anxiety.   DEXTROSE 5 % SOLN 50 ML WITH CEFTRIAXONE 2 G SOLR 2 G    Inject 2 g into the vein daily.   DILTIAZEM (DILACOR XR) 180 MG 24 HR CAPSULE    Take 180 mg by mouth daily.   DOCUSATE SODIUM 100 MG CAPS    Take 100 mg by mouth 2 (two) times daily.   DOXYCYCLINE (VIBRA-TABS) 100 MG TABLET    Take 100 mg by mouth daily.   DULOXETINE (CYMBALTA) 60 MG CAPSULE    Take 60 mg by mouth daily.   ETANERCEPT (ENBREL) 50 MG/ML INJECTION    Inject 50 mg into the skin once a week. Takes on Mondays   FERROUS SULFATE 325 (65 FE) MG TABLET    Take 1 tablet (325 mg total) by mouth 3 (three) times daily after meals.   LEVOTHYROXINE (SYNTHROID, LEVOTHROID) 75 MCG TABLET    Take 3 tablets (225 mcg total)  by mouth daily before breakfast.   METHOCARBAMOL (ROBAXIN) 500 MG TABLET    Take 1 tablet (500 mg total) by mouth every 6 (six) hours as needed (muscle spasms).   MULTIPLE VITAMIN (MULTIVITAMIN WITH MINERALS) TABS    Take 1 tablet by mouth daily.   OMEPRAZOLE (PRILOSEC) 20 MG CAPSULE    Take 20 mg by mouth daily.   OXYCODONE (OXY IR/ROXICODONE) 5 MG IMMEDIATE RELEASE TABLET    Take 1-3 tablets (5-15 mg total) by mouth every 4 (four) hours as needed for pain.   OXYCODONE (OXYCONTIN) 10 MG 12 HR TABLET    Take 1 tablet (10 mg total) by mouth every 12 (twelve) hours as needed. For pain   POLYETHYLENE GLYCOL (MIRALAX / GLYCOLAX) PACKET    Take 17 g by mouth daily.   PROBIOTIC PRODUCT (PROBIOTIC PO)    Take 1 tablet by mouth daily.   RANOLAZINE (RANEXA) 500 MG 12 HR TABLET    Take 500 mg by mouth 2 (two) times daily.   RIVAROXABAN (XARELTO) 20 MG TABS    Take 20 mg by mouth daily with supper.  TAMSULOSIN HCL (FLOMAX) 0.4 MG CAPS    Take 0.4 mg by mouth daily.  Modified Medications   No medications on file  Discontinued Medications   No medications on file    Subjective: [name] is in for his hospital followup visit. He suffered a severe right wrist and right femur fracture last August during a fall and underwent open reduction and internal fixation of both sites in [name], [name]. He had problems with right wrist wound healing and persistent right hip pain. He was readmitted to the hospital in [name] in December. 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 The right hip was revised to a total arthroplasty without any evidence of infection found.  However, he was found to have severe nonhealing in his right wrist with evidence of infection. He underwent extensive debridement and had foreign bodies in all old hardware removed. Antibiotic beads were placed and he had a new plate fixation device inserted. 3 separate operative specimens were collected and all grew Enterobacter. He is  now completed 31 days of total IV antibiotic therapy. He has had no problems tolerating his PICC or ceftriaxone. He states that when his cast has been removed by Dr. Amanda Pea the right wrist wound has looked much better.  Objective: Temp: 97.8 F (36.6 C) (01/23 1406) Temp src: Oral (01/23 1406) BP: 145/84 mmHg (01/23 1406) Pulse Rate: 91  (01/23 1406)  General: He is in good spirits. His partner, [name] [phone] is with him. Skin: His left arm PICC site appears normal His right arm is casted from mid humerus to the palm. He is seated in a wheelchair.  Lab Results  Component Value Date   CRP 9.3* 10/07/2012    Lab Results  Component Value Date   ESRSEDRATE 50* 10/07/2012     Assessment: It sounds like he is improving on therapy for her Enterobacter right wrist infection. I will need to discuss the situation with Dr. Amanda Pea before making a final decision about total duration of therapy. His Enterobacter isolate is susceptible to trimethoprim sulfamethoxazole so there is an easy alternative for conversion to oral therapy if he needs longer than 6 weeks total treatment. I will repeat inflammatory markers although he has psoriasis could be a confounding factor there. He is uncertain if he has been restarted on Inderal for the psoriasis. I've asked him to confirm that with his skilled nursing facility and let me know.  Plan: 1. Continue ceftriaxone through February 6 2. Repeat sedimentation rate and C-reactive protein 3. Determine if he has been restarted on Enbrel 4. Discuss with Dr. Amanda Pea 5. Followup in 4-6 weeks   Cliffton Asters, MD [facility] for Infectious Disease [facility] [phone] pager   [phone] cell 11/02/2012, 2:46 PM   Addendum:  Lab Results  Component Value Date   CRP 1.0* 11/02/2012    Lab Results  Component Value Date   ESRSEDRATE 16 11/02/2012     I discussed the situation with Dr. Amanda Pea who confirmed the very severe nature of his  nonunion fracture and infection. His inflammatory markers have improved so I will switch over to oral trimethoprim sulfamethoxazole after he completes 6 weeks of IV antibiotic therapy on February 3. The plan will be to continue trimethoprim sulfamethoxazole one double strength tablet twice daily for several months while we evaluate his progress.  I also learned today he that his Enbrel was restarted on January 27 for his psoriasis. Because this may the inhibitor healing,  Dr. Amanda Pea and I recommend stopping Enbrel for the foreseeable future.  Cliffton Asters, MD [facility] for Infectious Disease [facility] [phone] pager   [phone] cell 11/03/2012, 2:36 PM

## 2012-11-03 LAB — SEDIMENTATION RATE: Sed Rate: 16 mm/hr (ref 0–16)

## 2012-11-06 ENCOUNTER — Telehealth: Payer: Self-pay | Admitting: *Deleted

## 2012-11-06 NOTE — Telephone Encounter (Signed)
Patient partner came by clinic today to advise that he was told to expect a fax to [facility] on the patients behalf. The orders were to stop his Enbrel injections until the end of February and to place patient on oral antibiotics. Advised the caller that Dr [name] is in clinic at this time and that I will forward him a message which he will respond to after clinic. At which time I will get in touch with the patient and fax the information to [facility] Attn: Dr Sidonie Dickens [phone]

## 2012-11-14 LAB — AFB CULTURE WITH SMEAR (NOT AT ARMC): Acid Fast Smear: NONE SEEN

## 2012-11-25 ENCOUNTER — Other Ambulatory Visit: Payer: Self-pay

## 2012-11-29 ENCOUNTER — Telehealth: Payer: Self-pay | Admitting: *Deleted

## 2012-11-29 NOTE — Telephone Encounter (Signed)
Phone call received from patient's [name] re: patient to be d/c from [facility] on 12/01/12. Patient still with PICC line, but never started on oral abx. Per last office note, patient should have completed IV abx 11/16/12 and then started on Septra DS BID.  Patient has only been taking his regular preventative doxy BID (although 100mg  BID rather than 20mg  BID that is usually prescribed by his dentist).  Dr. [name] paged for clarification and verbal order called to [facility] RN Marisue Ivan.  Patient will be started on Septra DS BID today.  Marisue Ivan, RN asked for a verbal order to pull PICC before d/c.  PICC has been flushed BID and still has drawback.  Awaiting confirmation for this to be pulled, as additional labs may be ordered. Andree Coss, RN

## 2012-11-29 NOTE — Telephone Encounter (Signed)
No labs needed

## 2012-12-02 ENCOUNTER — Emergency Department (HOSPITAL_COMMUNITY): Payer: Medicare Other

## 2012-12-02 ENCOUNTER — Encounter (HOSPITAL_COMMUNITY): Payer: Self-pay | Admitting: Emergency Medicine

## 2012-12-02 ENCOUNTER — Emergency Department (HOSPITAL_COMMUNITY)
Admission: EM | Admit: 2012-12-02 | Discharge: 2012-12-02 | Disposition: A | Discharge status: Home / Self Care | Payer: Medicare Other | Attending: Emergency Medicine | Admitting: Emergency Medicine

## 2012-12-02 DIAGNOSIS — I4891 Unspecified atrial fibrillation: Secondary | ICD-10-CM | POA: Insufficient documentation

## 2012-12-02 DIAGNOSIS — K219 Gastro-esophageal reflux disease without esophagitis: Secondary | ICD-10-CM | POA: Insufficient documentation

## 2012-12-02 DIAGNOSIS — W010XXA Fall on same level from slipping, tripping and stumbling without subsequent striking against object, initial encounter: Secondary | ICD-10-CM | POA: Insufficient documentation

## 2012-12-02 DIAGNOSIS — I10 Essential (primary) hypertension: Secondary | ICD-10-CM | POA: Insufficient documentation

## 2012-12-02 DIAGNOSIS — S8990XA Unspecified injury of unspecified lower leg, initial encounter: Secondary | ICD-10-CM | POA: Insufficient documentation

## 2012-12-02 DIAGNOSIS — Z96649 Presence of unspecified artificial hip joint: Secondary | ICD-10-CM | POA: Insufficient documentation

## 2012-12-02 DIAGNOSIS — Y929 Unspecified place or not applicable: Secondary | ICD-10-CM | POA: Insufficient documentation

## 2012-12-02 DIAGNOSIS — Y9389 Activity, other specified: Secondary | ICD-10-CM | POA: Insufficient documentation

## 2012-12-02 DIAGNOSIS — Z9889 Other specified postprocedural states: Secondary | ICD-10-CM | POA: Insufficient documentation

## 2012-12-02 DIAGNOSIS — Z79899 Other long term (current) drug therapy: Secondary | ICD-10-CM | POA: Insufficient documentation

## 2012-12-02 DIAGNOSIS — Z87891 Personal history of nicotine dependence: Secondary | ICD-10-CM | POA: Insufficient documentation

## 2012-12-02 DIAGNOSIS — Z9181 History of falling: Secondary | ICD-10-CM | POA: Insufficient documentation

## 2012-12-02 DIAGNOSIS — E079 Disorder of thyroid, unspecified: Secondary | ICD-10-CM | POA: Insufficient documentation

## 2012-12-02 DIAGNOSIS — G4730 Sleep apnea, unspecified: Secondary | ICD-10-CM | POA: Insufficient documentation

## 2012-12-02 HISTORY — DX: Unspecified atrial fibrillation: I48.91

## 2012-12-02 HISTORY — DX: Disorder of thyroid, unspecified: E07.9

## 2012-12-02 HISTORY — DX: Gastro-esophageal reflux disease without esophagitis: K21.9

## 2012-12-02 MED ORDER — HYDROMORPHONE HCL PF 2 MG/ML IJ SOLN
2.0000 mg | Freq: Once | INTRAMUSCULAR | Status: AC
  Administered 2012-12-02: 2 mg via INTRAMUSCULAR
  Filled 2012-12-02: qty 1

## 2012-12-02 NOTE — Progress Notes (Addendum)
NCM spoke to pt and states he has HH PT/OT with [facility]. He had PT out today, but services were cancelled due to his need to come to ED. NCM explained to pt to follow up with his therapist to arrange visit on tomorrow.  NCM contacted [facility] agency and spoke to [facility] RN on call, Marcelino Duster to make aware of pt's return home this evening. Will fax orders, facesheet and dc summary to [facility] agency, fax [phone]. HH agency contact info added to dc instructions. Isidoro Donning RN CCM Case Mgmt phone [phone]

## 2012-12-02 NOTE — ED Notes (Signed)
Larey Seat out of walker has fallen several times in past year and has had multiple injuries and surgeries.  Just out of rehab yesterday at 11 fell at 4 pm , c/o rt knee and hurts tio bear weight on that rt knee

## 2012-12-02 NOTE — ED Provider Notes (Addendum)
History     CSN: [id number]  Arrival date & time 12/02/12  1057   First MD Initiated Contact with Patient 12/02/12 1137      Chief Complaint  Patient presents with  . Leg Pain  . Fall    (Consider location/radiation/quality/duration/timing/severity/associated sxs/prior treatment) HPI Comments: [name] is a 67 y.o. male who comes in for evaluation of right leg pain, after a fall. The fall occurred yesterday. The fall was mechanical after he tripped on a rug. He was able to get up and ambulate, with assistance using a walker, after the fall. He was evaluated at an outside hospital yesterday and prescribed Toradol, for his pain. He used his extended release oxycodone, this morning, with partial relief of the pain. There were no other injuries in the fall. He denies preceding symptoms. He denies recent fever, chills, nausea, vomiting, weakness, or dizziness. He was released from rehabilitation about 4 days ago, after a prolonged stay, following surgery on the right hip. He feels like he needs to go back into rehabilitation. Arrangements had been made, to have home health treatment in the home, starting today. His partner, canceled that appointment, after he came here.   Patient is a 67 y.o. male presenting with leg pain and fall. The history is provided by the patient and the spouse.  Leg Pain Fall    Past Medical History  Diagnosis Date  . Hypertension   . Sleep apnea   . Acid reflux   . A-fib   . Thyroid disease     Past Surgical History  Procedure Laterality Date  . Knee surgery    . Appendectomy    . Right femur surgery    . Orif wrist fracture  10/02/2012    Procedure: OPEN REDUCTION INTERNAL FIXATION (ORIF) WRIST FRACTURE;  Surgeon: Dominica Severin, MD;  Location: [facility];  Service: Orthopedics;  Laterality: Right;  WITH   ANTIBIOTIC  CEMENT  . Total hip revision  10/05/2012    Procedure: TOTAL HIP REVISION;  Surgeon: Shelda Pal, MD;  Location: [facility];  Service:  Orthopedics;  Laterality: Right;  RIGHT TOTAL HIP REVISION  . Hardware removal  10/05/2012    Procedure: HARDWARE REMOVAL;  Surgeon: Shelda Pal, MD;  Location: [facility];  Service: Orthopedics;  Laterality: Right;  REMOVING  STRYKER  GAMMA NAIL    Family History  Problem Relation Age of Onset  . CAD Father     History  Substance Use Topics  . Smoking status: Former Smoker    Quit date: 06/29/2012  . Smokeless tobacco: Not on file  . Alcohol Use: Yes     Comment: occasional      Review of Systems  All other systems reviewed and are negative.    Allergies  Review of patient's allergies indicates no known allergies.  Home Medications   Current Outpatient Rx  Name  Route  Sig  Dispense  Refill  . amiodarone (PACERONE) 100 MG tablet   Oral   Take 100 mg by mouth daily.         . Ascorbic Acid (VITAMIN C PO)   Oral   Take 1 tablet by mouth daily.         [name] atorvastatin (LIPITOR) 80 MG tablet   Oral   Take 80 mg by mouth daily.         . Cholecalciferol (VITAMIN D) 2000 UNITS CAPS   Oral   Take 1 capsule by mouth daily.         [name]  clonazePAM (KLONOPIN) 2 MG tablet   Oral   Take 1 tablet (2 mg total) by mouth 3 (three) times daily.   90 tablet   0   . clorazepate (TRANXENE-T) 15 MG tablet   Oral   Take 1 tablet (15 mg total) by mouth daily as needed for anxiety.   30 tablet   0   . diltiazem (DILACOR XR) 180 MG 24 hr capsule   Oral   Take 180 mg by mouth daily.         [name] docusate sodium 100 MG CAPS   Oral   Take 100 mg by mouth 2 (two) times daily.   10 capsule      . doxycycline (VIBRA-TABS) 100 MG tablet   Oral   Take 100 mg by mouth daily.         . DULoxetine (CYMBALTA) 60 MG capsule   Oral   Take 60 mg by mouth daily.         [name] etanercept (ENBREL) 50 MG/ML injection   Subcutaneous   Inject 50 mg into the skin once a week. Takes on Mondays         . ferrous sulfate 325 (65 FE) MG tablet   Oral   Take 1 tablet (325 mg  total) by mouth 3 (three) times daily after meals.         [name] ketorolac (TORADOL) 10 MG tablet   Oral   Take 10 mg by mouth every 6 (six) hours as needed for pain.         [name] levothyroxine (SYNTHROID, LEVOTHROID) 75 MCG tablet   Oral   Take 3 tablets (225 mcg total) by mouth daily before breakfast.   90 tablet   0   . loratadine (CLARITIN) 10 MG tablet   Oral   Take 10 mg by mouth daily.         . methocarbamol (ROBAXIN) 500 MG tablet   Oral   Take 1 tablet (500 mg total) by mouth every 6 (six) hours as needed (muscle spasms).   50 tablet   0   . Multiple Vitamin (MULTIVITAMIN WITH MINERALS) TABS   Oral   Take 1 tablet by mouth daily.         [name] omeprazole (PRILOSEC) 20 MG capsule   Oral   Take 20 mg by mouth daily.         [name] oxyCODONE (OXY IR/ROXICODONE) 5 MG immediate release tablet   Oral   Take 1-3 tablets (5-15 mg total) by mouth every 4 (four) hours as needed for pain.   120 tablet   0   . oxyCODONE (OXYCONTIN) 10 MG 12 hr tablet   Oral   Take 1 tablet (10 mg total) by mouth every 12 (twelve) hours as needed. For pain   60 tablet   0   . polyethylene glycol (MIRALAX / GLYCOLAX) packet   Oral   Take 17 g by mouth daily.   14 each      . Probiotic Product (PROBIOTIC PO)   Oral   Take 1 tablet by mouth daily.         . ranolazine (RANEXA) 500 MG 12 hr tablet   Oral   Take 500 mg by mouth 2 (two) times daily.         . Rivaroxaban (XARELTO) 20 MG TABS   Oral   Take 20 mg by mouth daily with supper.         [name]  sulfamethoxazole-trimethoprim (BACTRIM DS) 800-160 MG per tablet   Oral   Take 1 tablet by mouth 2 (two) times daily.         . Tamsulosin HCl (FLOMAX) 0.4 MG CAPS   Oral   Take 0.4 mg by mouth daily.           BP 157/55  Pulse 69  Resp 18  SpO2 97%  Physical Exam   Physical Exam  Nursing note and vitals reviewed. Constitutional: He is oriented to person, place, and time. He appears well-developed and  well-nourished. He appears distressed (Uncomfortable).  HENT:  Head: Normocephalic and atraumatic.  Right Ear: External ear normal.  Left Ear: External ear normal.  Eyes: Conjunctivae and EOM are normal. Pupils are equal, round, and reactive to light.  Neck: Normal range of motion and phonation normal. Neck supple.  Cardiovascular: Normal rate, regular rhythm, normal heart sounds and intact distal pulses.   Pulmonary/Chest: Effort normal and breath sounds normal. No respiratory distress. He exhibits no bony tenderness.  Abdominal: Soft. Normal appearance. There is no tenderness.  Musculoskeletal:  Moderate edema of right leg that the patient states is chronic. Mild right knee tenderness to palpation, without deformity. Good distal perfusion in the right foot.  Neurological: He is alert and oriented to person, place, and time. He has normal strength. No cranial nerve deficit or sensory deficit. He exhibits normal muscle tone. Coordination normal.  Skin: Skin is warm, dry and intact.  Psychiatric: He has a normal mood and affect. His behavior is normal. Judgment and thought content normal.      ED Course  Procedures (including critical care time)  I discussed the case, with care management, who will contact his home health service, to reinitiate his home treatment.  Labs Reviewed - No data to display Dg Pelvis 1-2 Views  12/02/2012  *RADIOLOGY REPORT*  Clinical Data: Right hip pain.  PELVIS - 1-2 VIEW  Comparison: 09/28/2012.  Findings: Total right hip arthroplasty changes.  The femoral and acetabular components appear intact.  Evidence of severe remote trauma involving the right upper femur.  The pubic symphysis and SI joints are intact.  No pelvic fractures are seen.  IMPRESSION: No acute bony findings.   Original Report Authenticated By: Rudie Meyer, M.D.    Dg Femur Right  12/02/2012  *RADIOLOGY REPORT*  Clinical Data: Right leg pain.  Fell.  RIGHT FEMUR - 2 VIEW  Comparison:  09/28/2012.  Findings: The long stem the right femoral prosthesis is intact.  No evidence of periprosthetic fracture.  Remote healed proximal femur fracture with extensive heterotopic ossification.  IMPRESSION: No acute bony findings.   Original Report Authenticated By: Rudie Meyer, M.D.    Dg Knee Complete 4 Views Right  12/02/2012  *RADIOLOGY REPORT*  Clinical Data: Larey Seat.  Right knee pain.  RIGHT KNEE - COMPLETE 4+ VIEW  Comparison: 09/28/2012.  Findings: The tibial and femoral components are well seated.  No complicating features or acute fracture.  No definite joint effusion although I do not have true lateral.  IMPRESSION: No acute bony findings.   Original Report Authenticated By: Rudie Meyer, M.D.    Nursing Notes Reviewed/ Care Coordinated Applicable Imaging Reviewed Interpretation of Laboratory Data incorporated into ED treatment  1. Leg pain   2. Fall       MDM  Mechanical fall, without significant injury. He is stable for discharge with outpatient management, to continue his current program.   Plan: Home Medications- usual; Home Treatments- rest; Recommended follow  up- Ortho for check up asap    Flint Melter, MD 12/02/12 1650  Flint Melter, MD 12/06/12 2048

## 2012-12-04 ENCOUNTER — Telehealth: Payer: Self-pay | Admitting: *Deleted

## 2012-12-04 ENCOUNTER — Other Ambulatory Visit: Payer: Self-pay | Admitting: *Deleted

## 2012-12-04 MED ORDER — SULFAMETHOXAZOLE-TMP DS 800-160 MG PO TABS: 1.0000 | ORAL_TABLET | Freq: Two times a day (BID) | ORAL | Status: DC

## 2012-12-04 NOTE — Telephone Encounter (Signed)
Patient's partner called requesting Bactrim DS rx sent to [facility].  SNF d/c'd patient with 1 week supply.  Rx sent.  Patient to follow up with Dr. [name] 12/12/12. Andree Coss, RN

## 2012-12-12 ENCOUNTER — Ambulatory Visit (INDEPENDENT_AMBULATORY_CARE_PROVIDER_SITE_OTHER): Payer: Medicare Other | Admitting: Internal Medicine

## 2012-12-12 ENCOUNTER — Encounter: Payer: Self-pay | Admitting: Internal Medicine

## 2012-12-12 VITALS — BP 146/59 | HR 60 | Temp 98.7°F | Ht 76.0 in | Wt 318.0 lb

## 2012-12-12 DIAGNOSIS — M869 Osteomyelitis, unspecified: Secondary | ICD-10-CM

## 2012-12-12 NOTE — Progress Notes (Signed)
Patient ID: [name], male   DOB: 11/25/1945, 67 y.o.   MRN: [id number]         [facility] Disease  Patient Active Problem List  Diagnosis  . Right femoral fracture  . Right forearm fracture  . Atrial fibrillation  . Hypothyroidism  . History of recurrent UTIs  . Normocytic anemia  . Low testosterone, possible hypogonadism  . HTN (hypertension)  . Psoriasis  . Dyslipidemia  . Vitamin D deficiency  . GERD (gastroesophageal reflux disease)  . Hyperglycemia  . Anxiety  . Depression  . CAD (coronary artery disease)  . DJD (degenerative joint disease)  . Chronic gingivitis  . Anemia  . Osteomyelitis of right wrist    Patient's Medications  New Prescriptions   No medications on file  Previous Medications   AMIODARONE (PACERONE) 100 MG TABLET    Take 100 mg by mouth daily.   ASCORBIC ACID (VITAMIN C PO)    Take 1 tablet by mouth daily.   ATORVASTATIN (LIPITOR) 80 MG TABLET    Take 80 mg by mouth daily.   CHOLECALCIFEROL (VITAMIN D) 2000 UNITS CAPS    Take 1 capsule by mouth daily.   CLONAZEPAM (KLONOPIN) 2 MG TABLET    Take 1 tablet (2 mg total) by mouth 3 (three) times daily.   CLORAZEPATE (TRANXENE-T) 15 MG TABLET    Take 1 tablet (15 mg total) by mouth daily as needed for anxiety.   DILTIAZEM (DILACOR XR) 180 MG 24 HR CAPSULE    Take 180 mg by mouth daily.   DOCUSATE SODIUM 100 MG CAPS    Take 100 mg by mouth 2 (two) times daily.   DOXYCYCLINE (DORYX) 100 MG DR CAPSULE    Take 100 mg by mouth 2 (two) times daily.   DULOXETINE (CYMBALTA) 60 MG CAPSULE    Take 60 mg by mouth daily.   ETANERCEPT (ENBREL) 50 MG/ML INJECTION    Inject 50 mg into the skin once a week. Takes on Mondays   FERROUS SULFATE 325 (65 FE) MG TABLET    Take 1 tablet (325 mg total) by mouth 3 (three) times daily after meals.   KETOROLAC (TORADOL) 10 MG TABLET    Take 10 mg by mouth every 6 (six) hours as needed for pain.   LEVOTHYROXINE (SYNTHROID, LEVOTHROID) 75 MCG TABLET     Take 3 tablets (225 mcg total) by mouth daily before breakfast.   LORATADINE (CLARITIN) 10 MG TABLET    Take 10 mg by mouth daily.   METHOCARBAMOL (ROBAXIN) 500 MG TABLET    Take 1 tablet (500 mg total) by mouth every 6 (six) hours as needed (muscle spasms).   MULTIPLE VITAMIN (MULTIVITAMIN WITH MINERALS) TABS    Take 1 tablet by mouth daily.   OMEPRAZOLE (PRILOSEC) 20 MG CAPSULE    Take 20 mg by mouth daily.   OXYCODONE (OXY IR/ROXICODONE) 5 MG IMMEDIATE RELEASE TABLET    Take 1-3 tablets (5-15 mg total) by mouth every 4 (four) hours as needed for pain.   OXYCODONE (OXYCONTIN) 10 MG 12 HR TABLET    Take 1 tablet (10 mg total) by mouth every 12 (twelve) hours as needed. For pain   POLYETHYLENE GLYCOL (MIRALAX / GLYCOLAX) PACKET    Take 17 g by mouth daily.   PROBIOTIC PRODUCT (PROBIOTIC PO)    Take 1 tablet by mouth daily.   RANOLAZINE (RANEXA) 500 MG 12 HR TABLET    Take 500 mg by mouth  2 (two) times daily.   RIVAROXABAN (XARELTO) 20 MG TABS    Take 20 mg by mouth daily with supper.   SULFAMETHOXAZOLE-TRIMETHOPRIM (BACTRIM DS) 800-160 MG PER TABLET    Take 1 tablet by mouth 2 (two) times daily.   TAMSULOSIN HCL (FLOMAX) 0.4 MG CAPS    Take 0.4 mg by mouth daily.  Modified Medications   No medications on file  Discontinued Medications   DOXYCYCLINE (VIBRA-TABS) 100 MG TABLET    Take 100 mg by mouth daily.    Subjective: [name] is in for his routine followup visit. He completed 6 weeks of IV ceftriaxone sometime in early to mid February. He is not sure when the nursing home stopped it. Unfortunately there was a short lag of approximately one week before he was discharged home and realized he was not on his oral trimethoprim-sulfamethoxazole was ordered. That was started on February 24. He is still off of his Enbrel. He states that Dr. Amanda Pea has told him that his right wrist is healing very well. He is having minimal pain.  Objective: Temp: 98.7 F (37.1 C) (03/04 1448) Temp src: Oral  (03/04 1448) BP: 146/59 mmHg (03/04 1448) Pulse Rate: 60 (03/04 1448)  General: he is a good spirits sitting in his wheelchair Skin: mild dry skin but no obvious psoriasis flare His right forearm incision is healed nicely without any overt signs of infection  Lab Results  Component Value Date   CRP 1.0* 11/02/2012   Lab Results  Component Value Date   ESRSEDRATE 16 11/02/2012     Assessment: He is responding well to extensive surgery and antibiotic therapy for Enterobacter osteomyelitis of his right wrist and forearm. His sedimentation rate and C-reactive protein are returning toward normal. I will continue trimethoprim sulfamethoxazole for at least one more month.  Plan: 1. Repeat sedimentation rate and C-reactive protein 2. Continue trimethoprim sulfamethoxazole 3. Followup in one month   Cliffton Asters, MD [facility] [phone] pager   [phone] cell 12/12/2012, 3:14 PM

## 2013-01-16 ENCOUNTER — Ambulatory Visit (INDEPENDENT_AMBULATORY_CARE_PROVIDER_SITE_OTHER): Payer: Medicare Other | Admitting: Internal Medicine

## 2013-01-16 ENCOUNTER — Encounter: Payer: Self-pay | Admitting: Internal Medicine

## 2013-01-16 VITALS — BP 151/77 | HR 87 | Temp 97.3°F | Ht 76.0 in | Wt 318.5 lb

## 2013-01-16 DIAGNOSIS — M869 Osteomyelitis, unspecified: Secondary | ICD-10-CM

## 2013-01-16 NOTE — Progress Notes (Signed)
Patient ID: [name], male   DOB: 1946/01/06, 67 y.o.   MRN: [id number]         [facility] for Infectious Disease  Patient Active Problem List  Diagnosis  . Right femoral fracture  . Right forearm fracture  . Atrial fibrillation  . Hypothyroidism  . History of recurrent UTIs  . Normocytic anemia  . Low testosterone, possible hypogonadism  . HTN (hypertension)  . Psoriasis  . Dyslipidemia  . Vitamin D deficiency  . GERD (gastroesophageal reflux disease)  . Hyperglycemia  . Anxiety  . Depression  . CAD (coronary artery disease)  . DJD (degenerative joint disease)  . Chronic gingivitis  . Anemia  . Osteomyelitis of right wrist    Patient's Medications  New Prescriptions   No medications on file  Previous Medications   AMIODARONE (PACERONE) 100 MG TABLET    Take 100 mg by mouth daily.   ASCORBIC ACID (VITAMIN C PO)    Take 1 tablet by mouth daily.   ATORVASTATIN (LIPITOR) 80 MG TABLET    Take 80 mg by mouth daily.   CHOLECALCIFEROL (VITAMIN D) 2000 UNITS CAPS    Take 1 capsule by mouth daily.   CLONAZEPAM (KLONOPIN) 2 MG TABLET    Take 1 tablet (2 mg total) by mouth 3 (three) times daily.   CLORAZEPATE (TRANXENE-T) 15 MG TABLET    Take 1 tablet (15 mg total) by mouth daily as needed for anxiety.   DILTIAZEM (DILACOR XR) 180 MG 24 HR CAPSULE    Take 240 mg by mouth daily.    DOCUSATE SODIUM 100 MG CAPS    Take 100 mg by mouth 2 (two) times daily.   DOXYCYCLINE (DORYX) 100 MG DR CAPSULE    Take 100 mg by mouth 2 (two) times daily.   DULOXETINE (CYMBALTA) 60 MG CAPSULE    Take 60 mg by mouth daily.   ETANERCEPT (ENBREL) 50 MG/ML INJECTION    Inject 50 mg into the skin once a week. Takes on Mondays   FERROUS SULFATE 325 (65 FE) MG TABLET    Take 1 tablet (325 mg total) by mouth 3 (three) times daily after meals.   KETOROLAC (TORADOL) 10 MG TABLET    Take 10 mg by mouth every 6 (six) hours as needed for pain.   LEVOTHYROXINE (SYNTHROID, LEVOTHROID) 75 MCG TABLET     Take 3 tablets (225 mcg total) by mouth daily before breakfast.   LORATADINE (CLARITIN) 10 MG TABLET    Take 10 mg by mouth daily.   METHOCARBAMOL (ROBAXIN) 500 MG TABLET    Take 1 tablet (500 mg total) by mouth every 6 (six) hours as needed (muscle spasms).   MULTIPLE VITAMIN (MULTIVITAMIN WITH MINERALS) TABS    Take 1 tablet by mouth daily.   OMEPRAZOLE (PRILOSEC) 20 MG CAPSULE    Take 20 mg by mouth daily.   OXYCODONE (OXY IR/ROXICODONE) 5 MG IMMEDIATE RELEASE TABLET    Take 1-3 tablets (5-15 mg total) by mouth every 4 (four) hours as needed for pain.   OXYCODONE (OXYCONTIN) 10 MG 12 HR TABLET    Take 1 tablet (10 mg total) by mouth every 12 (twelve) hours as needed. For pain   POLYETHYLENE GLYCOL (MIRALAX / GLYCOLAX) PACKET    Take 17 g by mouth daily.   PROBIOTIC PRODUCT (PROBIOTIC PO)    Take 1 tablet by mouth daily.   RANOLAZINE (RANEXA) 500 MG 12 HR TABLET    Take 500 mg by  mouth 2 (two) times daily.   RIVAROXABAN (XARELTO) 20 MG TABS    Take 20 mg by mouth daily with supper.   SULFAMETHOXAZOLE-TRIMETHOPRIM (BACTRIM DS) 800-160 MG PER TABLET    Take 1 tablet by mouth 2 (two) times daily.   TAMSULOSIN HCL (FLOMAX) 0.4 MG CAPS    Take 0.4 mg by mouth daily.   TESTOSTERONE CYPIONATE (DEPOTESTOTERONE CYPIONATE) 100 MG/ML INJECTION    Inject into the muscle every 14 (fourteen) days. For IM use only  Modified Medications   No medications on file  Discontinued Medications   No medications on file    Subjective: [name] is in for his routine visit. He is now completed 3 months of total antibiotic therapy after surgery for his right wrist osteomyelitis. He received 6 weeks of IV ceftriaxone followed by oral trimethoprim sulfamethoxazole for the past 6 weeks. He has tolerated his antibiotics well. His right wrist is feeling much better.  Objective: Temp: 97.3 F (36.3 C) (04/08 1006) Temp src: Oral (04/08 1006) BP: 151/77 mmHg (04/08 1006) Pulse Rate: 87 (04/08 1006)  General: He is  in good spirits His right wrist incisions have healed and there is no evidence of active inflammation.  Lab Results  Component Value Date   CRP 2.2* 12/12/2012   Lab Results  Component Value Date   ESRSEDRATE 17* 12/12/2012    Assessment: He is doing much better and his infection may be cured at this point. I will repeat his inflammatory markers and discuss the situation with his hand surgeon, Dr. Amanda Pea before making a decision about discontinuing his antibiotic.  Plan: 1. Repeat sedimentation rate and C-reactive protein 2. Discuss situation with Dr. Amanda Pea 3. Followup in 2 months   Cliffton Asters, MD [facility] for Infectious Disease [facility] [phone] pager   [phone] cell 01/16/2013, 10:25 AM

## 2013-01-17 LAB — SEDIMENTATION RATE: Sed Rate: 8 mm/hr (ref 0–16)

## 2013-01-29 ENCOUNTER — Other Ambulatory Visit: Payer: Self-pay | Admitting: Internal Medicine

## 2013-01-29 ENCOUNTER — Encounter: Payer: Self-pay | Admitting: Licensed Clinical Social Worker

## 2013-01-29 ENCOUNTER — Telehealth: Payer: Self-pay | Admitting: Internal Medicine

## 2013-01-29 DIAGNOSIS — M869 Osteomyelitis, unspecified: Secondary | ICD-10-CM

## 2013-01-29 NOTE — Telephone Encounter (Signed)
I spoke with Dr. Amanda Pea today who relayed that he still hopes to do a bone graft on [name]'s right wrist. We decided that it would be best to continue his antibiotic therapy for another 3 months given the severe, extensive osteomyelitis that was found at the time of his surgery. I left a message on Carron's phone to continue taking the antibiotic.  Lab Results  Component Value Date   CRP 1.8* 01/16/2013   Lab Results  Component Value Date   ESRSEDRATE 8 01/16/2013   Cliffton Asters, MD [facility] [phone] pager   [phone] cell 01/29/2013, 10:03 AM

## 2013-02-02 ENCOUNTER — Encounter (HOSPITAL_COMMUNITY): Payer: Self-pay | Admitting: *Deleted

## 2013-02-02 ENCOUNTER — Inpatient Hospital Stay (HOSPITAL_COMMUNITY)
Admission: EM | Admit: 2013-02-02 | Discharge: 2013-02-09 | DRG: 493 | Disposition: A | Discharge status: Home / Self Care | Payer: Medicare Other | Attending: Orthopedic Surgery | Admitting: Orthopedic Surgery

## 2013-02-02 DIAGNOSIS — M899 Disorder of bone, unspecified: Secondary | ICD-10-CM | POA: Diagnosis present

## 2013-02-02 DIAGNOSIS — R739 Hyperglycemia, unspecified: Secondary | ICD-10-CM | POA: Diagnosis present

## 2013-02-02 DIAGNOSIS — E785 Hyperlipidemia, unspecified: Secondary | ICD-10-CM | POA: Diagnosis present

## 2013-02-02 DIAGNOSIS — R7989 Other specified abnormal findings of blood chemistry: Secondary | ICD-10-CM | POA: Diagnosis present

## 2013-02-02 DIAGNOSIS — M545 Low back pain, unspecified: Secondary | ICD-10-CM | POA: Diagnosis present

## 2013-02-02 DIAGNOSIS — M1990 Unspecified osteoarthritis, unspecified site: Secondary | ICD-10-CM | POA: Diagnosis present

## 2013-02-02 DIAGNOSIS — D649 Anemia, unspecified: Secondary | ICD-10-CM | POA: Diagnosis present

## 2013-02-02 DIAGNOSIS — Q539 Undescended testicle, unspecified: Secondary | ICD-10-CM

## 2013-02-02 DIAGNOSIS — S32010A Wedge compression fracture of first lumbar vertebra, initial encounter for closed fracture: Secondary | ICD-10-CM

## 2013-02-02 DIAGNOSIS — E669 Obesity, unspecified: Secondary | ICD-10-CM | POA: Diagnosis present

## 2013-02-02 DIAGNOSIS — J4489 Other specified chronic obstructive pulmonary disease: Secondary | ICD-10-CM | POA: Diagnosis present

## 2013-02-02 DIAGNOSIS — F329 Major depressive disorder, single episode, unspecified: Secondary | ICD-10-CM | POA: Diagnosis present

## 2013-02-02 DIAGNOSIS — K219 Gastro-esophageal reflux disease without esophagitis: Secondary | ICD-10-CM | POA: Diagnosis present

## 2013-02-02 DIAGNOSIS — F32A Depression, unspecified: Secondary | ICD-10-CM | POA: Diagnosis present

## 2013-02-02 DIAGNOSIS — Z87440 Personal history of urinary (tract) infections: Secondary | ICD-10-CM | POA: Diagnosis present

## 2013-02-02 DIAGNOSIS — I2510 Atherosclerotic heart disease of native coronary artery without angina pectoris: Secondary | ICD-10-CM | POA: Diagnosis present

## 2013-02-02 DIAGNOSIS — D62 Acute posthemorrhagic anemia: Secondary | ICD-10-CM | POA: Diagnosis not present

## 2013-02-02 DIAGNOSIS — Z87891 Personal history of nicotine dependence: Secondary | ICD-10-CM

## 2013-02-02 DIAGNOSIS — F419 Anxiety disorder, unspecified: Secondary | ICD-10-CM | POA: Diagnosis present

## 2013-02-02 DIAGNOSIS — K056 Periodontal disease, unspecified: Secondary | ICD-10-CM | POA: Diagnosis present

## 2013-02-02 DIAGNOSIS — S82201A Unspecified fracture of shaft of right tibia, initial encounter for closed fracture: Secondary | ICD-10-CM

## 2013-02-02 DIAGNOSIS — R5381 Other malaise: Secondary | ICD-10-CM | POA: Diagnosis present

## 2013-02-02 DIAGNOSIS — N400 Enlarged prostate without lower urinary tract symptoms: Secondary | ICD-10-CM | POA: Diagnosis present

## 2013-02-02 DIAGNOSIS — A498 Other bacterial infections of unspecified site: Secondary | ICD-10-CM | POA: Diagnosis present

## 2013-02-02 DIAGNOSIS — M869 Osteomyelitis, unspecified: Secondary | ICD-10-CM | POA: Diagnosis present

## 2013-02-02 DIAGNOSIS — L408 Other psoriasis: Secondary | ICD-10-CM | POA: Diagnosis present

## 2013-02-02 DIAGNOSIS — M810 Age-related osteoporosis without current pathological fracture: Secondary | ICD-10-CM | POA: Diagnosis present

## 2013-02-02 DIAGNOSIS — N390 Urinary tract infection, site not specified: Secondary | ICD-10-CM | POA: Diagnosis present

## 2013-02-02 DIAGNOSIS — I4811 Longstanding persistent atrial fibrillation: Secondary | ICD-10-CM | POA: Diagnosis present

## 2013-02-02 DIAGNOSIS — J449 Chronic obstructive pulmonary disease, unspecified: Secondary | ICD-10-CM | POA: Diagnosis present

## 2013-02-02 DIAGNOSIS — I10 Essential (primary) hypertension: Secondary | ICD-10-CM | POA: Diagnosis present

## 2013-02-02 DIAGNOSIS — W19XXXA Unspecified fall, initial encounter: Secondary | ICD-10-CM | POA: Diagnosis present

## 2013-02-02 DIAGNOSIS — K0510 Chronic gingivitis, plaque induced: Secondary | ICD-10-CM | POA: Diagnosis present

## 2013-02-02 DIAGNOSIS — F411 Generalized anxiety disorder: Secondary | ICD-10-CM | POA: Diagnosis present

## 2013-02-02 DIAGNOSIS — I4891 Unspecified atrial fibrillation: Secondary | ICD-10-CM | POA: Diagnosis present

## 2013-02-02 DIAGNOSIS — D638 Anemia in other chronic diseases classified elsewhere: Secondary | ICD-10-CM | POA: Diagnosis present

## 2013-02-02 DIAGNOSIS — M949 Disorder of cartilage, unspecified: Secondary | ICD-10-CM | POA: Diagnosis present

## 2013-02-02 DIAGNOSIS — K5900 Constipation, unspecified: Secondary | ICD-10-CM | POA: Diagnosis present

## 2013-02-02 DIAGNOSIS — S82209A Unspecified fracture of shaft of unspecified tibia, initial encounter for closed fracture: Principal | ICD-10-CM | POA: Diagnosis present

## 2013-02-02 DIAGNOSIS — E039 Hypothyroidism, unspecified: Secondary | ICD-10-CM | POA: Diagnosis present

## 2013-02-02 DIAGNOSIS — S82201S Unspecified fracture of shaft of right tibia, sequela: Secondary | ICD-10-CM

## 2013-02-02 DIAGNOSIS — F3289 Other specified depressive episodes: Secondary | ICD-10-CM | POA: Diagnosis present

## 2013-02-02 HISTORY — DX: Pure hypercholesterolemia, unspecified: E78.00

## 2013-02-02 HISTORY — DX: Low back pain: M54.5

## 2013-02-02 HISTORY — DX: Low back pain, unspecified: M54.50

## 2013-02-02 HISTORY — DX: Other specified abnormal findings of blood chemistry: R79.89

## 2013-02-02 HISTORY — DX: Anxiety disorder, unspecified: F41.9

## 2013-02-02 HISTORY — DX: Atherosclerotic heart disease of native coronary artery without angina pectoris: I25.10

## 2013-02-02 HISTORY — DX: Unspecified osteoarthritis, unspecified site: M19.90

## 2013-02-02 HISTORY — DX: Major depressive disorder, single episode, unspecified: F32.9

## 2013-02-02 NOTE — ED Notes (Signed)
Per EMS pt RL tib/fib fx from fall earlier today, a/o x 3, pt does have dilaudid on board, R AC heplock, pt from [facility].

## 2013-02-02 NOTE — ED Notes (Signed)
Bed:WA14<BR> Expected date:<BR> Expected time:<BR> Means of arrival:<BR> Comments:<BR>

## 2013-02-03 ENCOUNTER — Inpatient Hospital Stay (HOSPITAL_COMMUNITY): Payer: Medicare Other

## 2013-02-03 LAB — URINALYSIS, ROUTINE W REFLEX MICROSCOPIC
Glucose, UA: NEGATIVE mg/dL
Ketones, ur: NEGATIVE mg/dL
Nitrite: NEGATIVE
Specific Gravity, Urine: 1.023 (ref 1.005–1.030)
pH: 6 (ref 5.0–8.0)

## 2013-02-03 LAB — PROTIME-INR: INR: 1.66 — ABNORMAL HIGH (ref 0.00–1.49)

## 2013-02-03 LAB — BASIC METABOLIC PANEL
BUN: 17 mg/dL (ref 6–23)
CO2: 27 mEq/L (ref 19–32)
Chloride: 100 mEq/L (ref 96–112)
GFR calc Af Amer: 86 mL/min — ABNORMAL LOW (ref >90)
Potassium: 3.9 mEq/L (ref 3.5–5.1)

## 2013-02-03 LAB — SURGICAL PCR SCREEN: Staphylococcus aureus: NEGATIVE

## 2013-02-03 LAB — URINE MICROSCOPIC-ADD ON

## 2013-02-03 MED ORDER — DILTIAZEM HCL ER 240 MG PO CP24
240.0000 mg | ORAL_CAPSULE | Freq: Every day | ORAL | Status: DC
  Administered 2013-02-03 – 2013-02-09 (×7): 240 mg via ORAL
  Filled 2013-02-03 (×7): qty 1

## 2013-02-03 MED ORDER — METHOCARBAMOL 500 MG PO TABS
500.0000 mg | ORAL_TABLET | Freq: Four times a day (QID) | ORAL | Status: DC | PRN
  Administered 2013-02-03 – 2013-02-05 (×2): 500 mg via ORAL
  Filled 2013-02-03 (×2): qty 1

## 2013-02-03 MED ORDER — RANOLAZINE ER 500 MG PO TB12
500.0000 mg | ORAL_TABLET | Freq: Two times a day (BID) | ORAL | Status: DC
  Administered 2013-02-03 – 2013-02-09 (×12): 500 mg via ORAL
  Filled 2013-02-03 (×14): qty 1

## 2013-02-03 MED ORDER — CLORAZEPATE DIPOTASSIUM 3.75 MG PO TABS
15.0000 mg | ORAL_TABLET | Freq: Every day | ORAL | Status: DC | PRN
  Administered 2013-02-04 – 2013-02-05 (×2): 15 mg via ORAL
  Filled 2013-02-03 (×3): qty 2

## 2013-02-03 MED ORDER — ADULT MULTIVITAMIN W/MINERALS CH
1.0000 | ORAL_TABLET | Freq: Every day | ORAL | Status: DC
  Administered 2013-02-03 – 2013-02-09 (×7): 1 via ORAL
  Filled 2013-02-03 (×7): qty 1

## 2013-02-03 MED ORDER — DULOXETINE HCL 60 MG PO CPEP
60.0000 mg | ORAL_CAPSULE | Freq: Every day | ORAL | Status: DC
  Administered 2013-02-03 – 2013-02-09 (×7): 60 mg via ORAL
  Filled 2013-02-03 (×7): qty 1

## 2013-02-03 MED ORDER — METHOCARBAMOL 500 MG PO TABS: 500.0000 mg | ORAL_TABLET | Freq: Four times a day (QID) | ORAL | Status: DC | PRN

## 2013-02-03 MED ORDER — LORATADINE 10 MG PO TABS
10.0000 mg | ORAL_TABLET | Freq: Every day | ORAL | Status: DC
  Administered 2013-02-03 – 2013-02-09 (×7): 10 mg via ORAL
  Filled 2013-02-03 (×7): qty 1

## 2013-02-03 MED ORDER — ROSUVASTATIN CALCIUM 40 MG PO TABS
40.0000 mg | ORAL_TABLET | Freq: Every day | ORAL | Status: DC
  Administered 2013-02-03 – 2013-02-08 (×6): 40 mg via ORAL
  Filled 2013-02-03 (×8): qty 1

## 2013-02-03 MED ORDER — OXYCODONE HCL 5 MG PO TABS
5.0000 mg | ORAL_TABLET | ORAL | Status: DC | PRN
  Administered 2013-02-03 – 2013-02-06 (×7): 10 mg via ORAL
  Filled 2013-02-03 (×9): qty 2

## 2013-02-03 MED ORDER — TAMSULOSIN HCL 0.4 MG PO CAPS
0.4000 mg | ORAL_CAPSULE | Freq: Every day | ORAL | Status: DC
  Administered 2013-02-03 – 2013-02-09 (×7): 0.4 mg via ORAL
  Filled 2013-02-03 (×8): qty 1

## 2013-02-03 MED ORDER — DOXYCYCLINE HYCLATE 100 MG PO CPEP: 100.0000 mg | ORAL_CAPSULE | Freq: Two times a day (BID) | ORAL | Status: DC

## 2013-02-03 MED ORDER — SULFAMETHOXAZOLE-TMP DS 800-160 MG PO TABS
1.0000 | ORAL_TABLET | Freq: Two times a day (BID) | ORAL | Status: DC
  Administered 2013-02-03 – 2013-02-09 (×12): 1 via ORAL
  Filled 2013-02-03 (×15): qty 1

## 2013-02-03 MED ORDER — METOPROLOL TARTRATE 25 MG PO TABS
25.0000 mg | ORAL_TABLET | Freq: Every day | ORAL | Status: DC
  Administered 2013-02-03 – 2013-02-09 (×7): 25 mg via ORAL
  Filled 2013-02-03 (×7): qty 1

## 2013-02-03 MED ORDER — LEVOTHYROXINE SODIUM 25 MCG PO TABS
225.0000 ug | ORAL_TABLET | Freq: Every day | ORAL | Status: DC
  Administered 2013-02-04 – 2013-02-06 (×3): 225 ug via ORAL
  Filled 2013-02-03 (×4): qty 1

## 2013-02-03 MED ORDER — METHOCARBAMOL 100 MG/ML IJ SOLN: 500.0000 mg | Freq: Four times a day (QID) | INTRAVENOUS | Status: DC | PRN

## 2013-02-03 MED ORDER — MORPHINE SULFATE 4 MG/ML IJ SOLN
4.0000 mg | INTRAMUSCULAR | Status: DC | PRN
  Administered 2013-02-03 – 2013-02-06 (×5): 4 mg via INTRAVENOUS
  Filled 2013-02-03 (×5): qty 1

## 2013-02-03 MED ORDER — POLYETHYLENE GLYCOL 3350 17 G PO PACK
17.0000 g | PACK | Freq: Every day | ORAL | Status: DC
  Administered 2013-02-03 – 2013-02-05 (×3): 17 g via ORAL

## 2013-02-03 MED ORDER — DOXYCYCLINE HYCLATE 100 MG PO TABS
100.0000 mg | ORAL_TABLET | Freq: Two times a day (BID) | ORAL | Status: DC
  Administered 2013-02-03 – 2013-02-08 (×11): 100 mg via ORAL
  Filled 2013-02-03 (×13): qty 1

## 2013-02-03 MED ORDER — VITAMIN D 1000 UNITS PO TABS
2000.0000 [IU] | ORAL_TABLET | Freq: Every day | ORAL | Status: DC
  Administered 2013-02-03 – 2013-02-09 (×7): 2000 [IU] via ORAL
  Filled 2013-02-03 (×7): qty 2

## 2013-02-03 MED ORDER — NITROGLYCERIN 0.4 MG SL SUBL: 0.4000 mg | SUBLINGUAL_TABLET | SUBLINGUAL | Status: DC | PRN

## 2013-02-03 MED ORDER — OXYCODONE-ACETAMINOPHEN 5-325 MG PO TABS: 1.0000 | ORAL_TABLET | ORAL | Status: DC | PRN

## 2013-02-03 MED ORDER — PANTOPRAZOLE SODIUM 40 MG PO TBEC
40.0000 mg | DELAYED_RELEASE_TABLET | Freq: Every day | ORAL | Status: DC
  Administered 2013-02-03 – 2013-02-09 (×6): 40 mg via ORAL
  Filled 2013-02-03 (×6): qty 1

## 2013-02-03 MED ORDER — VITAMIN C 250 MG PO TABS
250.0000 mg | ORAL_TABLET | Freq: Every day | ORAL | Status: DC
  Administered 2013-02-03 – 2013-02-09 (×7): 250 mg via ORAL
  Filled 2013-02-03 (×7): qty 1

## 2013-02-03 MED ORDER — ATORVASTATIN CALCIUM 80 MG PO TABS: 80.0000 mg | ORAL_TABLET | Freq: Every day | ORAL | Status: DC

## 2013-02-03 MED ORDER — SODIUM CHLORIDE 0.9 % IV SOLN
INTRAVENOUS | Status: DC
  Administered 2013-02-03 – 2013-02-04 (×2): via INTRAVENOUS
  Administered 2013-02-05: 20 mL via INTRAVENOUS
  Administered 2013-02-06: 14:00:00 via INTRAVENOUS

## 2013-02-03 MED ORDER — MORPHINE SULFATE 4 MG/ML IJ SOLN
4.0000 mg | Freq: Once | INTRAMUSCULAR | Status: AC
  Administered 2013-02-03: 4 mg via INTRAVENOUS
  Filled 2013-02-03: qty 1

## 2013-02-03 MED ORDER — ENOXAPARIN SODIUM 40 MG/0.4ML ~~LOC~~ SOLN
40.0000 mg | SUBCUTANEOUS | Status: DC
  Administered 2013-02-03 – 2013-02-05 (×3): 40 mg via SUBCUTANEOUS
  Filled 2013-02-03 (×3): qty 0.4

## 2013-02-03 MED ORDER — AMIODARONE HCL 100 MG PO TABS
100.0000 mg | ORAL_TABLET | Freq: Two times a day (BID) | ORAL | Status: DC
  Administered 2013-02-03 – 2013-02-09 (×13): 100 mg via ORAL
  Filled 2013-02-03 (×15): qty 1

## 2013-02-03 MED ORDER — CLONAZEPAM 1 MG PO TABS
2.0000 mg | ORAL_TABLET | Freq: Three times a day (TID) | ORAL | Status: DC
  Administered 2013-02-03 – 2013-02-09 (×16): 2 mg via ORAL
  Filled 2013-02-03 (×17): qty 2

## 2013-02-03 NOTE — Progress Notes (Signed)
Ordered a CXR for rib pain.  Patient had CXR on 02-03-13.  Patient did not want CXR repeated at this time since he has not been out of bed since fall feels like earlier CXR adequate.

## 2013-02-03 NOTE — Progress Notes (Signed)
[name], [name] , notified patient complaining of pain in low right anterior chest.  Patient describes as "rib pain".  He feels he injured this in early fall.  Orders received

## 2013-02-03 NOTE — ED Notes (Signed)
Pt states was walking outside when fell, tib/fib fx to R leg, from [facility].

## 2013-02-03 NOTE — H&P (Signed)
[name] is an 67 y.o. male.   Chief Complaint: right leg pain HPI: 67 yo male s/p ground level fall today on his deck. Patient complained of immediate deformity and inability to bear weight on the injured leg.  Patient transported by EMS to [facility] for initial evaluation.  Patient noted to have displaced tibia an fibula fracture.  Patient requests [facility].  Transported by ambulance to [facility].  Past Medical History  Diagnosis Date  . Hypertension   . Sleep apnea   . Acid reflux   . A-fib   . Thyroid disease     Past Surgical History  Procedure Laterality Date  . Knee surgery    . Appendectomy    . Right femur surgery    . Orif wrist fracture  10/02/2012    Procedure: OPEN REDUCTION INTERNAL FIXATION (ORIF) WRIST FRACTURE;  Surgeon: Joseph Severin, MD;  Location: [facility];  Service: Orthopedics;  Laterality: Right;  WITH   ANTIBIOTIC  CEMENT  . Total hip revision  10/05/2012    Procedure: TOTAL HIP REVISION;  Surgeon: Joseph Pal, MD;  Location: [facility];  Service: Orthopedics;  Laterality: Right;  RIGHT TOTAL HIP REVISION  . Hardware removal  10/05/2012    Procedure: HARDWARE REMOVAL;  Surgeon: Joseph Pal, MD;  Location: [facility];  Service: Orthopedics;  Laterality: Right;  REMOVING  STRYKER  GAMMA NAIL    Family History  Problem Relation Age of Onset  . CAD Father    Social History:  reports that he quit smoking about 7 months ago. He does not have any smokeless tobacco history on file. He reports that  drinks alcohol. He reports that he does not use illicit drugs. Here with husband [name]. Allergies: No Known Allergies  Medications: patient on the following meds: Xarelto, Prilosec, Clonipen, Clorazepam, Testosterone inj (every two weeks, last yesterday), Claritin, Cymbalta, Vit D, Atorvastatin, Flomax dosages unavailable at this time.      ROS  Blood pressure 118/59, pulse 101, temperature 97.6 F (36.4 C),  temperature source Oral, resp. rate 19, SpO2 94.00%. Physical Exam Patient is comfortable on the ED stretcher as long as his right leg is not moved. Bilateral UEs and the left LE are nontender and no pain with ROM, Right LE externally rotated at the tib/fib..in a short leg splint, neuro intact in the splint  [facility] from [facility] show a displaced and comminuted right tibia and fibula fracture, distal 1/3 shaft fracture. Ankle mortise intact  Assessment/Plan Unstable, closed and NVI distal tib/fib fracture Patient has a TKR on that leg preventing IM nailing, and there is severe osteopenia noted on his XRAYs which complicates his problem.  Will need to discuss with Dr [name], foot and ankle specialist in our group and possibly Dr [name] the patient has seen in the past) for their expertise in managing complex ankle trauma.  Admit, elevate the leg, and DVT prophylaxis  [name],[name] 02/03/2013, 12:11 AM

## 2013-02-03 NOTE — Progress Notes (Signed)
Patient refuses foot pumps teds and ice.  Explained to patient why ordered patient stated understanding.  No new orders

## 2013-02-03 NOTE — Progress Notes (Signed)
Subjective:     Patient reports pain as 4 on 0-10 scale.  Moves foot well. Circulation intact.  Objective: Vital signs in last 24 hours: Temp:  [97.3 F (36.3 C)-98.5 F (36.9 C)] 98.5 F (36.9 C) (04/26 0622) Pulse Rate:  [101-110] 110 (04/26 0622) Resp:  [16-19] 16 (04/26 0622) BP: (118-149)/(59-82) 136/68 mmHg (04/26 0622) SpO2:  [94 %-97 %] 97 % (04/26 0622) Weight:  [144.244 kg (318 lb)] 144.244 kg (318 lb) (04/26 0220)  Intake/Output from previous day: 04/25 0701 - 04/26 0700 In: 99.3 [I.V.:99.3] Out: 650 [Urine:650] Intake/Output this shift:    No results found for this basename: HGB,  in the last 72 hours No results found for this basename: WBC, RBC, HCT, PLT,  in the last 72 hours  Recent Labs  02/03/13 0401  NA 137  K 3.9  CL 100  CO2 27  BUN 17  CREATININE 1.02  GLUCOSE 158*  CALCIUM 8.7    Recent Labs  02/03/13 0401  INR 1.66*    Neurologically intact  Assessment/Plan:     Up with therapy  Nare Gaspari A 02/03/2013, 9:51 AM

## 2013-02-03 NOTE — ED Notes (Signed)
Surgeon at bedside.  

## 2013-02-04 ENCOUNTER — Encounter (HOSPITAL_COMMUNITY): Payer: Self-pay | Admitting: Orthopedic Surgery

## 2013-02-04 NOTE — Progress Notes (Signed)
   Subjective:     pt resting comfortably with minimal to no pain as long as no movement Dr. Carola Frost to see patient this week and discuss surgical management Patient may need to be transferred [facility]   Patient reports pain as mild.  Objective:   VITALS:   Filed Vitals:   02/04/13 0525  BP: 114/75  Pulse: 94  Temp: 97.4 F (36.3 C)  Resp: 18   Right lower extremity in splint Neurologically intact distally  LABS No results found for this basename: HGB, HCT, WBC, PLT,  in the last 72 hours   Recent Labs  02/03/13 0401  NA 137  K 3.9  BUN 17  CREATININE 1.02  GLUCOSE 158*     Assessment/Plan:    Right tib/fib fracture Continue strict non weight bearing Will follow up with Dr. Carola Frost tomorrow      Alphonsa Overall, MPAS, PA-C  02/04/2013, 7:50 AM

## 2013-02-05 ENCOUNTER — Encounter (HOSPITAL_COMMUNITY): Payer: Self-pay | Admitting: Internal Medicine

## 2013-02-05 DIAGNOSIS — S82201A Unspecified fracture of shaft of right tibia, initial encounter for closed fracture: Secondary | ICD-10-CM

## 2013-02-05 DIAGNOSIS — I2510 Atherosclerotic heart disease of native coronary artery without angina pectoris: Secondary | ICD-10-CM

## 2013-02-05 DIAGNOSIS — E291 Testicular hypofunction: Secondary | ICD-10-CM

## 2013-02-05 DIAGNOSIS — S8290XS Unspecified fracture of unspecified lower leg, sequela: Secondary | ICD-10-CM

## 2013-02-05 DIAGNOSIS — M869 Osteomyelitis, unspecified: Secondary | ICD-10-CM

## 2013-02-05 DIAGNOSIS — K0510 Chronic gingivitis, plaque induced: Secondary | ICD-10-CM

## 2013-02-05 DIAGNOSIS — D649 Anemia, unspecified: Secondary | ICD-10-CM

## 2013-02-05 DIAGNOSIS — R7309 Other abnormal glucose: Secondary | ICD-10-CM

## 2013-02-05 DIAGNOSIS — I10 Essential (primary) hypertension: Secondary | ICD-10-CM

## 2013-02-05 DIAGNOSIS — Z87440 Personal history of urinary (tract) infections: Secondary | ICD-10-CM

## 2013-02-05 DIAGNOSIS — E669 Obesity, unspecified: Secondary | ICD-10-CM

## 2013-02-05 DIAGNOSIS — E039 Hypothyroidism, unspecified: Secondary | ICD-10-CM

## 2013-02-05 HISTORY — DX: Unspecified fracture of shaft of right tibia, initial encounter for closed fracture: S82.201A

## 2013-02-05 LAB — URINE CULTURE

## 2013-02-05 LAB — GLUCOSE, CAPILLARY: Glucose-Capillary: 127 mg/dL — ABNORMAL HIGH (ref 70–99)

## 2013-02-05 MED ORDER — BISACODYL 5 MG PO TBEC
10.0000 mg | DELAYED_RELEASE_TABLET | Freq: Every day | ORAL | Status: DC | PRN
  Filled 2013-02-05: qty 2

## 2013-02-05 MED ORDER — DEXTROSE 5 % IV SOLN
1.0000 g | INTRAVENOUS | Status: DC
  Administered 2013-02-05: 1 g via INTRAVENOUS
  Filled 2013-02-05 (×2): qty 10

## 2013-02-05 MED ORDER — POLYETHYLENE GLYCOL 3350 17 G PO PACK
17.0000 g | PACK | Freq: Two times a day (BID) | ORAL | Status: DC
  Administered 2013-02-06 – 2013-02-09 (×7): 17 g via ORAL
  Filled 2013-02-05 (×10): qty 1

## 2013-02-05 MED ORDER — DOCUSATE SODIUM 100 MG PO CAPS
100.0000 mg | ORAL_CAPSULE | Freq: Two times a day (BID) | ORAL | Status: DC
  Administered 2013-02-06 – 2013-02-09 (×7): 100 mg via ORAL
  Filled 2013-02-05 (×8): qty 1

## 2013-02-05 MED ORDER — INSULIN ASPART 100 UNIT/ML ~~LOC~~ SOLN: 0.0000 [IU] | Freq: Three times a day (TID) | SUBCUTANEOUS | Status: DC

## 2013-02-05 NOTE — Clinical Documentation Improvement (Signed)
Abnormal Labs Clarification  THIS DOCUMENT IS NOT A PERMANENT PART OF THE MEDICAL RECORD  TO RESPOND TO THE THIS QUERY, FOLLOW THE INSTRUCTIONS BELOW:  1. If needed, update documentation for the patient's encounter via the notes activity.  2. Access this query again and click edit on the [employer].  3. After updating, or not, click F2 to complete all highlighted (required) fields concerning your review. Select "additional documentation in the medical record" OR "no additional documentation provided".  4. Click Sign note button.  5. The deficiency will fall out of your InBasket *Please let us know if you are not able to complete this workflow by phone or e-mail (listed below).  Please update your documentation within the medical record to reflect your response to this query.                                                                                   02/05/13  Dear Dr. Ranell Patrick, [facility]  In a better effort to capture your patient's severity of illness, reflect appropriate length of stay and utilization of resources, a review of the medical record has revealed the following indicators.    Based on your clinical judgment, please clarify and document in a progress note and/or discharge summary the clinical condition associated with the following supporting information:  In responding to this query please exercise your independent judgment.  The fact that a query is asked, does not imply that any particular answer is desired or expected.  Abnormal findings (laboratory, x-ray, pathologic, and other diagnostic results) are not coded and reported unless the physician indicates their clinical significance.   The medical record reflects the following clinical findings, please clarify the diagnostic and/or clinical significance:       Clarification Needed   Please clarify the diagnosis responsible for the abnormal UA/Urine culture below necessitating the treatment of Bactrim and  document in pn or d/c summary.    Possible Clinical Conditions?                                   ____________________                                       Other Condition___________________                Cannot Clinically Determine_________   Supporting Information: Diagnostics: UA Component     Latest Ref Rng 02/03/2013  Color, Urine     YELLOW AMBER (A)   Component     Latest Ref Rng 02/03/2013  Leukocytes, UA     NEGATIVE MODERATE (A)   Component     Latest Ref Rng 02/03/2013  Culture  Setup Time      02/03/2013 11:57  Colony Count      >=100,000 COLONIES/ML  Culture      ESCHERICHIA COLI  Report Status      02/05/2013 FINAL  Organism ID, Bacteria      ESCHERICHIA COLI  WBC, UA     <  3 WBC/hpf 11-20  RBC / HPF     <3 RBC/hpf 21-50  Bacteria, UA     RARE MANY (A)    Treatment:  sulfamethoxazole-trimethoprim (BACTRIM DS) 800-160 MG per tablet 1 tablet  Monitoring  Reviewed: additional documentation in the medical record ljh   Thank You,  Enis Slipper  RN, BSN, MSN/Inf, CCDS Clinical Documentation Specialist [facility] Dept Pager: [phone] / E-mail: [email or web address]  Health Information Management [place]

## 2013-02-05 NOTE — Progress Notes (Signed)
   Subjective:   Procedure(s) (LRB): OPEN REDUCTION INTERNAL FIXATION (ORIF) TIBIA FRACTURE WITH IM ROD FIBULA (Right)   Patient reports pain as none as long as no movement to right leg Will plan on transfer today to [facility] and evaluation by Dr. Carola Frost for surgery  Objective:   VITALS:   Filed Vitals:   02/05/13 0600  BP: 121/67  Pulse: 97  Temp: 97.8 F (36.6 C)  Resp: 16   Splint in place Neurologically intact distally  LABS No results found for this basename: HGB, HCT, WBC, PLT,  in the last 72 hours   Recent Labs  02/03/13 0401  NA 137  K 3.9  BUN 17  CREATININE 1.02  GLUCOSE 158*     Assessment/Plan:   Procedure(s) (LRB): OPEN REDUCTION INTERNAL FIXATION (ORIF) TIBIA FRACTURE WITH IM ROD FIBULA (Right)   Plan for transfer to [facility] today  Evaluation by medical team later today prior to surgery possibly tomorrow Pt in agreement   [employer], MPAS, PA-C  02/05/2013, 10:06 AM

## 2013-02-05 NOTE — Consult Note (Addendum)
[facility] Medical Consultation  [name] NWG:[id number] DOB: 1946-07-20 DOA: 02/02/2013 PCP: Joseph Raider, MD   Requesting physician: Dr. Darrelyn Hillock Date of consultation: 02/05/2013 Reason for consultation:  Preoperative assessment, management of chronic medical conditions  Impression/Recommendations Principal Problem:   Right tibial fracture Active Problems:   Atrial fibrillation   Hypothyroidism   History of recurrent UTIs   Normocytic anemia   Low testosterone, possible hypogonadism   HTN (hypertension)   Psoriasis   Dyslipidemia   GERD (gastroesophageal reflux disease)   Hyperglycemia   Anxiety   Depression   CAD (coronary artery disease)   Chronic gingivitis   Osteomyelitis of right wrist   Obesity, unspecified    Comminuted impacted mildly angulated fractures of the distal third of the tibial and fibular diaphyses in setting of recurrent fractures -  Patient is moderate risk for intermediate risk urgent procedure -  To OR per orthopedics -  Agree with vitamin d levels and outpatient DEXA  Back pain:  Denies radiation down his legs or changes in bowel or bladder function, but has been persistent -  Further evaluation per orthopedics.    CAD, stable.  No clear evidence of angina.  No valvular problems and preserved EF.  RCRI 1-2 (if undiagnosed diabetes) = 1% - 2.4% -  Continue beta blocker and statin perioperatively. -  Continue ranolazine -  Holding xarelto preoperatively  Atrial fibrillation, stable.   -  Start telemetry and continue post-operatively.   -  Goal HR < 100bpm -  Continue dilt, beta blocker, and amiodarone -  Hold xarelto preoperatively and restart post-operatively per surgery  HTN, blood pressures stable to low HLD, stable, continue statin  Dyspnea on exertion, likely due to deconditioning in the setting of obesity.  May have some underlying COPD due to cigarette use history, however, lungs are hypoinflated on CXR -  Albuterol  prn -  Incentive spirometry -  Encourage OOB as much as tolerated  OSA, stable.  Continue CPAP at night  Hyperglycemia and borderline diabetes, no recent A1c -  A1c in AM -  Start low dose SSI for now  Hypothyroidism, repeat TSH and continue synthroid  E. Coli UTI, asymptomatic.  Agree with ceftriaxone Gingival disease.  Stable.  Continue doxycycline Right hand osteomyelitis with hardware in place.  Continue bactrim.    Psoriasis.  Hold Enbrel  Anxiety and depression, stable for now.  Continue home medications  Constipation, chronic problem and likely acutely worse due to narcotics -  Add colace -  Increase to BID miralax -  Add bisacodyl prn  Anemia of chronic disease, no recent h/h -  CBC in AM  I will followup again tomorrow. Please contact me if I can be of assistance in the meanwhile. Thank you for this consultation.  Chief Complaint: Fall and right leg pain  HPI:  The patient is a 67 year old male with history of CAD, HTN, HLD, atrial fibrillation with hx of RVR, hypothyroidism post Graves-related ablation, hx of cigarette use, recurrent UTI, low testosterone, psoriasis, anxiety and depression who presents with a mechanical fall and resultant tibia and fibula fractures.  The patient was at his baseline health in August of 2013 when he sustained a mechanical fall resulting in right wrist and right femur fractures which were repaired.  He underwent several months of rehabilitation, however, did poorly and underwent repeat surgery on both wrist and femur in 09/2012 including hip replacement on the right.  He was discharged to rehab and did well and went  home in early February 2014.  Shortly after discharge to home, he had another mechanical fall that caused him to have severe lumbar back pain which he feels has interfered with his recovery.  He progressed from home health PT/OT to outpatient PT/OT recently, however, he had another mechanical fall when he mis stepped with "my foot  facing the wrong direction" onto the deck.  He has been seen by orthopedics and they are anticipating surgical repair on 4/29.    CAD:  Last cath was 12/2011:  Has completely occluded RCA with collateralization.  LAD with 20% stenosis.  No stents were placed and no CABG.  His EF was preserved at that time and valves were normal.  He had another ECHO on 01/09/2013 which was poor quality, but per his cardiologist, his EF appears preserved.  Patient denies chest pain with exertion.  He is on ranolazine, but per his cardiologist, this was an attempt to see if it would improve his dyspnea with exertion but not due to high suspicion of refractory symptomatic stable angina.  Denies lower extremity edema, PND, orthopnea.    Atrial fibrillation with RVR (and brief episode of flutter):  Occurred peri-operatively with his knee replacements 2 years ago.  He did not require electrical cardioversion.  He has been in chronic atrial fibrillation, rate controlled and on anticoagulation.  He has not had any complications from his atrial fibrillation in over a year.  Dyspnea with exertion:  His not able to sustain 3-4 METS due to dyspnea.  He can walk about 50-158ft before needing to rest to catch his breath.  Denies associated lightheadedness, chest pain or pressure, nausea, or diaphoresis.  He denies associated wheeze or cough.  He has never been diagnosed with COPD although he does have a history of smoking.  He wears his CPAP almost every night.    Review of Systems:  Denies fevers, chills, changes to hearing or vision.  Denies rhinorrhea, sinus congestion, sore throat.  Denies chest pains, palpitations, cough, wheeze.  Denies nausea, vomiting, diarrhea.  Has chronic constipation for which he takes laxatives daily.  Denies hemoptysis, hematemesis, hematochezia, melena, epistaxis, or abnormal bruising or bleeding.  Has intermittent dysuria, but none recently.  Denies urgency or frequency.  Denies focal numbness.  Has  generalized weakness, particularly of the right hand.  Endorses some slurred speech after his pain medication, but otherwise denies slurred speech, confusion.  Has anxiety and depression at baseline and has stressor of recent conflict with his partner.  Has pain of the right lower extremity which is better since his pain medication.    Past Medical History  Diagnosis Date  . Hypertension     patient states he does not have high blood pressure  . Sleep apnea     cpap machine  . Acid reflux   . A-fib   . Thyroid disease   . Low testosterone, possible hypogonadism 10/01/2012  . Lumbago   . High cholesterol   . Anxiety   . Depression   . CAD (coronary artery disease)    Past Surgical History  Procedure Laterality Date  . Knee surgery  2012  . Appendectomy    . Right femur surgery  05/2012  . Orif wrist fracture  10/02/2012    Procedure: OPEN REDUCTION INTERNAL FIXATION (ORIF) WRIST FRACTURE;  Surgeon: Dominica Severin, MD;  Location: [facility];  Service: Orthopedics;  Laterality: Right;  WITH   ANTIBIOTIC  CEMENT  . Total hip revision  10/05/2012  Procedure: TOTAL HIP REVISION;  Surgeon: Shelda Pal, MD;  Location: [facility];  Service: Orthopedics;  Laterality: Right;  RIGHT TOTAL HIP REVISION  . Hardware removal  10/05/2012    Procedure: HARDWARE REMOVAL;  Surgeon: Shelda Pal, MD;  Location: [facility];  Service: Orthopedics;  Laterality: Right;  REMOVING  STRYKER  GAMMA NAIL  . Wrist fracture surgery  05/2012   Social History:  reports that he quit smoking about 8 months ago. He does not have any smokeless tobacco history on file. He reports that  drinks alcohol. He reports that he does not use illicit drugs.  No Known Allergies Family History  Problem Relation Age of Onset  . CAD Father 8  . Arthritis Mother     Prior to Admission medications   Medication Sig Start Date End Date Taking? Authorizing Provider  amiodarone (PACERONE) 200 MG tablet Take 100 mg by mouth 2 (two) times  daily.   Yes Historical Provider, MD  Ascorbic Acid (VITAMIN C PO) Take 1 tablet by mouth daily.   Yes Historical Provider, MD  atorvastatin (LIPITOR) 80 MG tablet Take 80 mg by mouth at bedtime.    Yes Historical Provider, MD  Cholecalciferol (VITAMIN D) 2000 UNITS CAPS Take 1 capsule by mouth daily.   Yes Historical Provider, MD  clonazePAM (KLONOPIN) 2 MG tablet Take 1 tablet (2 mg total) by mouth 3 (three) times daily. 10/10/12  Yes Laveda Norman, MD  diltiazem (DILACOR XR) 240 MG 24 hr capsule Take 240 mg by mouth daily.   Yes Historical Provider, MD  doxycycline (DORYX) 100 MG DR capsule Take 100 mg by mouth 2 (two) times daily.   Yes Historical Provider, MD  DULoxetine (CYMBALTA) 60 MG capsule Take 60 mg by mouth daily.   Yes Historical Provider, MD  levothyroxine (SYNTHROID, LEVOTHROID) 75 MCG tablet Take 3 tablets (225 mcg total) by mouth daily before breakfast. 10/10/12  Yes Laveda Norman, MD  loratadine (CLARITIN) 10 MG tablet Take 10 mg by mouth daily.   Yes Historical Provider, MD  methocarbamol (ROBAXIN) 500 MG tablet Take 1 tablet (500 mg total) by mouth every 6 (six) hours as needed (muscle spasms). 10/10/12  Yes Genelle Gather Babish, PA-C  metoprolol tartrate (LOPRESSOR) 25 MG tablet Take 25 mg by mouth daily as needed. For increased heart rate   Yes Historical Provider, MD  Multiple Vitamin (MULTIVITAMIN WITH MINERALS) TABS Take 1 tablet by mouth daily.   Yes Historical Provider, MD  nitroGLYCERIN (NITROSTAT) 0.4 MG SL tablet Place 0.4 mg under the tongue every 5 (five) minutes as needed for chest pain.   Yes Historical Provider, MD  omeprazole (PRILOSEC) 20 MG capsule Take 20 mg by mouth daily.   Yes Historical Provider, MD  oxyCODONE-acetaminophen (PERCOCET/ROXICET) 5-325 MG per tablet Take 1 tablet by mouth every 4 (four) hours as needed for pain.   Yes Historical Provider, MD  polyethylene glycol (MIRALAX / GLYCOLAX) packet Take 17 g by mouth daily. 10/10/12  Yes Genelle Gather  Babish, PA-C  Probiotic Product (PROBIOTIC PO) Take 1 tablet by mouth daily.   Yes Historical Provider, MD  ranolazine (RANEXA) 500 MG 12 hr tablet Take 500 mg by mouth 2 (two) times daily.   Yes Historical Provider, MD  Rivaroxaban (XARELTO) 20 MG TABS Take 20 mg by mouth daily with supper.   Yes Historical Provider, MD  sulfamethoxazole-trimethoprim (BACTRIM DS) 800-160 MG per tablet TAKE 1 TABLET BY MOUTH TWICE A DAY 01/29/13  Yes John  [name], MD  Tamsulosin HCl (FLOMAX) 0.4 MG CAPS Take 0.4 mg by mouth daily.   Yes Historical Provider, MD  clorazepate (TRANXENE-T) 15 MG tablet Take 1 tablet (15 mg total) by mouth daily as needed for anxiety. 10/10/12   Laveda Norman, MD  etanercept (ENBREL) 50 MG/ML injection Inject 50 mg into the skin once a week. Takes on Mondays    Historical Provider, MD  testosterone cypionate (DEPOTESTOTERONE CYPIONATE) 100 MG/ML injection Inject into the muscle every 14 (fourteen) days. For IM use only    Historical Provider, MD   Physical Exam: Blood pressure 109/58, pulse 93, temperature 97.9 F (36.6 C), temperature source Oral, resp. rate 18, height 6\' 4"  (1.93 m), weight 144.244 kg (318 lb), SpO2 96.00%. Filed Vitals:   02/05/13 0600 02/05/13 0800 02/05/13 1403 02/05/13 1550  BP: 121/67  134/76 109/58  Pulse: 97  103 93  Temp: 97.8 F (36.6 C)  97.4 F (36.3 C) 97.9 F (36.6 C)  TempSrc: Oral  Oral   Resp: 16 15 16 18   Height:      Weight:      SpO2: 94%  97% 96%     General:  Obese CM, no acute distress  Eyes: PERRL, anicteric, noninjected  ENT: Nares clear, OP nonerythematous, no exudate  Neck: supple, no lymphadenopathy  Cardiovascular: IRRR, distant heart sounds, no obvious murmurs, 2+ pulses, warm extremities  Respiratory: CTAB, diminished breath sounds at the bilateral bases, no increased WOB  Abdomen: NABS, soft, obese, nontender, nondistended  Skin: no rash or ulcer  Musculoskeletal:  Decreased tone and bulk.  Right leg splinted.  <  2 sec CR of the right toes.  Warm.  Brace in place on the right wrist.    Psychiatric: A&Ox4, pleasant, conversational, good historian  Neurologic: CN II-XII grossly intact, strength 5/5 LUE and LLE extremities, however, RLE not tested due to fracture/splint and RUE 3-4/5 wrist and 5-/5 elbow and shoulder.    Labs on Admission:  Basic Metabolic Panel:  Recent Labs Lab 02/03/13 0401  NA 137  K 3.9  CL 100  CO2 27  GLUCOSE 158*  BUN 17  CREATININE 1.02  CALCIUM 8.7   Liver Function Tests: No results found for this basename: AST, ALT, ALKPHOS, BILITOT, PROT, ALBUMIN,  in the last 168 hours No results found for this basename: LIPASE, AMYLASE,  in the last 168 hours No results found for this basename: AMMONIA,  in the last 168 hours CBC: No results found for this basename: WBC, NEUTROABS, HGB, HCT, MCV, PLT,  in the last 168 hours Cardiac Enzymes: No results found for this basename: CKTOTAL, CKMB, CKMBINDEX, TROPONINI,  in the last 168 hours BNP: No components found with this basename: POCBNP,  CBG: No results found for this basename: GLUCAP,  in the last 168 hours  Radiological Exams on Admission: No results found.  EKG: atrial fibrillation, rate 106, Q-waves in III, aVF, V1, V2, no ST segment elevations or depressions or T-wave inversions  Time spent: 83  Layce Sprung [facility] Pager [phone]  If 7PM-7AM, please contact night-coverage [email or web address] Password [facility] 02/05/2013, 5:47 PM

## 2013-02-05 NOTE — Progress Notes (Signed)
Orthopedic Tech Progress Note Patient Details:  [name] 1946/04/22 [id number]  Ortho Devices Type of Ortho Device: Post (long) splint Ortho Device/Splint Location: applied overhead frame to bed Ortho Device/Splint Interventions: Ordered;Application   Jennye Moccasin 02/05/2013, 7:16 PM

## 2013-02-06 ENCOUNTER — Inpatient Hospital Stay (HOSPITAL_COMMUNITY): Payer: Medicare Other

## 2013-02-06 ENCOUNTER — Encounter (HOSPITAL_COMMUNITY): Payer: Self-pay | Admitting: Anesthesiology

## 2013-02-06 ENCOUNTER — Encounter (HOSPITAL_COMMUNITY)
Admission: EM | Disposition: A | Discharge status: Home / Self Care | Payer: Self-pay | Source: Home / Self Care | Attending: Orthopedic Surgery

## 2013-02-06 ENCOUNTER — Inpatient Hospital Stay (HOSPITAL_COMMUNITY): Payer: Medicare Other | Admitting: Anesthesiology

## 2013-02-06 DIAGNOSIS — I4891 Unspecified atrial fibrillation: Secondary | ICD-10-CM

## 2013-02-06 DIAGNOSIS — F411 Generalized anxiety disorder: Secondary | ICD-10-CM

## 2013-02-06 HISTORY — PX: ORIF TIBIA FRACTURE: SHX5416

## 2013-02-06 LAB — VITAMIN D 25 HYDROXY (VIT D DEFICIENCY, FRACTURES): Vit D, 25-Hydroxy: 35 ng/mL (ref 30–89)

## 2013-02-06 LAB — BASIC METABOLIC PANEL
Chloride: 100 mEq/L (ref 96–112)
GFR calc Af Amer: >90 mL/min (ref >90)
GFR calc non Af Amer: 85 mL/min — ABNORMAL LOW (ref >90)
Glucose, Bld: 112 mg/dL — ABNORMAL HIGH (ref 70–99)
Potassium: 4.2 mEq/L (ref 3.5–5.1)
Sodium: 135 mEq/L (ref 135–145)

## 2013-02-06 LAB — CBC
Hemoglobin: 10.8 g/dL — ABNORMAL LOW (ref 13.0–17.0)
MCHC: 33.1 g/dL (ref 30.0–36.0)
WBC: 6.8 10*3/uL (ref 4.0–10.5)

## 2013-02-06 LAB — GLUCOSE, CAPILLARY
Glucose-Capillary: 104 mg/dL — ABNORMAL HIGH (ref 70–99)
Glucose-Capillary: 97 mg/dL (ref 70–99)

## 2013-02-06 LAB — HEMOGLOBIN A1C: Hgb A1c MFr Bld: 5.8 % — ABNORMAL HIGH (ref <5.7)

## 2013-02-06 SURGERY — OPEN REDUCTION INTERNAL FIXATION (ORIF) TIBIA FRACTURE
Anesthesia: General | Site: Leg Lower | Laterality: Right | Wound class: Clean

## 2013-02-06 MED ORDER — ONDANSETRON HCL 4 MG/2ML IJ SOLN
INTRAMUSCULAR | Status: DC | PRN
  Administered 2013-02-06: 4 mg via INTRAVENOUS

## 2013-02-06 MED ORDER — DEXTROSE 5 % IV SOLN
1.0000 g | INTRAVENOUS | Status: DC
  Administered 2013-02-07 – 2013-02-09 (×3): 1 g via INTRAVENOUS
  Filled 2013-02-06 (×3): qty 10

## 2013-02-06 MED ORDER — FENTANYL CITRATE 0.05 MG/ML IJ SOLN
INTRAMUSCULAR | Status: DC | PRN
  Administered 2013-02-06: 250 ug via INTRAVENOUS

## 2013-02-06 MED ORDER — NEOSTIGMINE METHYLSULFATE 1 MG/ML IJ SOLN
INTRAMUSCULAR | Status: DC | PRN
  Administered 2013-02-06: 4 mg via INTRAVENOUS

## 2013-02-06 MED ORDER — HYDROMORPHONE HCL PF 1 MG/ML IJ SOLN
INTRAMUSCULAR | Status: AC
  Filled 2013-02-06: qty 1

## 2013-02-06 MED ORDER — SODIUM CHLORIDE 0.9 % IJ SOLN: 9.0000 mL | INTRAMUSCULAR | Status: DC | PRN

## 2013-02-06 MED ORDER — PHENYLEPHRINE HCL 10 MG/ML IJ SOLN
INTRAMUSCULAR | Status: DC | PRN
  Administered 2013-02-06 (×2): 160 ug via INTRAVENOUS
  Administered 2013-02-06: 120 ug via INTRAVENOUS
  Administered 2013-02-06 (×2): 160 ug via INTRAVENOUS

## 2013-02-06 MED ORDER — HYDROMORPHONE HCL PF 1 MG/ML IJ SOLN
INTRAMUSCULAR | Status: AC
  Administered 2013-02-06: 0.5 mg
  Filled 2013-02-06: qty 1

## 2013-02-06 MED ORDER — VECURONIUM BROMIDE 10 MG IV SOLR
INTRAVENOUS | Status: DC | PRN
  Administered 2013-02-06: 3 mg via INTRAVENOUS
  Administered 2013-02-06: 2 mg via INTRAVENOUS

## 2013-02-06 MED ORDER — METHOCARBAMOL 500 MG PO TABS
500.0000 mg | ORAL_TABLET | Freq: Four times a day (QID) | ORAL | Status: DC | PRN
  Administered 2013-02-08 (×2): 500 mg via ORAL
  Filled 2013-02-06 (×2): qty 1

## 2013-02-06 MED ORDER — LIDOCAINE HCL (CARDIAC) 20 MG/ML IV SOLN
INTRAVENOUS | Status: DC | PRN
  Administered 2013-02-06: 100 mg via INTRAVENOUS

## 2013-02-06 MED ORDER — METHOCARBAMOL 500 MG PO TABS
ORAL_TABLET | ORAL | Status: AC
  Administered 2013-02-06: 500 mg
  Filled 2013-02-06: qty 1

## 2013-02-06 MED ORDER — DIPHENHYDRAMINE HCL 12.5 MG/5ML PO ELIX: 12.5000 mg | ORAL_SOLUTION | Freq: Four times a day (QID) | ORAL | Status: DC | PRN

## 2013-02-06 MED ORDER — RIVAROXABAN 20 MG PO TABS
20.0000 mg | ORAL_TABLET | Freq: Every day | ORAL | Status: DC
  Administered 2013-02-07 – 2013-02-08 (×2): 20 mg via ORAL
  Filled 2013-02-06 (×3): qty 1

## 2013-02-06 MED ORDER — ROCURONIUM BROMIDE 100 MG/10ML IV SOLN
INTRAVENOUS | Status: DC | PRN
  Administered 2013-02-06: 50 mg via INTRAVENOUS

## 2013-02-06 MED ORDER — PROPOFOL 10 MG/ML IV BOLUS
INTRAVENOUS | Status: DC | PRN
  Administered 2013-02-06: 200 mg via INTRAVENOUS

## 2013-02-06 MED ORDER — OXYCODONE HCL 5 MG PO TABS
ORAL_TABLET | ORAL | Status: AC
  Filled 2013-02-06: qty 1

## 2013-02-06 MED ORDER — MIDAZOLAM HCL 5 MG/5ML IJ SOLN
INTRAMUSCULAR | Status: DC | PRN
  Administered 2013-02-06: 2 mg via INTRAVENOUS

## 2013-02-06 MED ORDER — ONDANSETRON HCL 4 MG PO TABS: 4.0000 mg | ORAL_TABLET | Freq: Four times a day (QID) | ORAL | Status: DC | PRN

## 2013-02-06 MED ORDER — METOCLOPRAMIDE HCL 5 MG/ML IJ SOLN: 5.0000 mg | Freq: Three times a day (TID) | INTRAMUSCULAR | Status: DC | PRN

## 2013-02-06 MED ORDER — MORPHINE SULFATE (PF) 1 MG/ML IV SOLN
INTRAVENOUS | Status: AC
  Filled 2013-02-06: qty 25

## 2013-02-06 MED ORDER — METOCLOPRAMIDE HCL 10 MG PO TABS: 5.0000 mg | ORAL_TABLET | Freq: Three times a day (TID) | ORAL | Status: DC | PRN

## 2013-02-06 MED ORDER — MORPHINE SULFATE (PF) 1 MG/ML IV SOLN
INTRAVENOUS | Status: DC
  Administered 2013-02-06 (×2): 3 mg via INTRAVENOUS
  Administered 2013-02-07: 1.5 mg via INTRAVENOUS
  Administered 2013-02-07: 4.5 mg via INTRAVENOUS
  Administered 2013-02-07: 4.09 mg via INTRAVENOUS
  Administered 2013-02-07: 2.01 mg via INTRAVENOUS

## 2013-02-06 MED ORDER — HYDROMORPHONE HCL PF 1 MG/ML IJ SOLN
0.2500 mg | INTRAMUSCULAR | Status: DC | PRN
  Administered 2013-02-06 (×4): 0.5 mg via INTRAVENOUS

## 2013-02-06 MED ORDER — ALBUMIN HUMAN 5 % IV SOLN
INTRAVENOUS | Status: DC | PRN
  Administered 2013-02-06: 09:00:00 via INTRAVENOUS

## 2013-02-06 MED ORDER — DIPHENHYDRAMINE HCL 50 MG/ML IJ SOLN: 12.5000 mg | Freq: Four times a day (QID) | INTRAMUSCULAR | Status: DC | PRN

## 2013-02-06 MED ORDER — GLYCOPYRROLATE 0.2 MG/ML IJ SOLN
INTRAMUSCULAR | Status: DC | PRN
  Administered 2013-02-06: .7 mg via INTRAVENOUS

## 2013-02-06 MED ORDER — ONDANSETRON HCL 4 MG/2ML IJ SOLN: 4.0000 mg | Freq: Four times a day (QID) | INTRAMUSCULAR | Status: DC | PRN

## 2013-02-06 MED ORDER — PHENYLEPHRINE HCL 10 MG/ML IJ SOLN
10.0000 mg | INTRAVENOUS | Status: DC | PRN
  Administered 2013-02-06: 25 ug/min via INTRAVENOUS

## 2013-02-06 MED ORDER — METHOCARBAMOL 100 MG/ML IJ SOLN
1000.0000 mg | Freq: Four times a day (QID) | INTRAMUSCULAR | Status: DC | PRN
  Filled 2013-02-06: qty 10

## 2013-02-06 MED ORDER — ENOXAPARIN SODIUM 40 MG/0.4ML ~~LOC~~ SOLN
40.0000 mg | SUBCUTANEOUS | Status: DC
  Filled 2013-02-06: qty 0.4

## 2013-02-06 MED ORDER — OXYCODONE HCL 5 MG PO TABS
5.0000 mg | ORAL_TABLET | ORAL | Status: DC | PRN
  Administered 2013-02-06: 5 mg via ORAL
  Administered 2013-02-06 – 2013-02-09 (×9): 10 mg via ORAL
  Filled 2013-02-06 (×10): qty 2

## 2013-02-06 MED ORDER — ENOXAPARIN SODIUM 40 MG/0.4ML ~~LOC~~ SOLN: 40.0000 mg | Freq: Once | SUBCUTANEOUS | Status: DC

## 2013-02-06 MED ORDER — LEVOTHYROXINE SODIUM 125 MCG PO TABS
250.0000 ug | ORAL_TABLET | Freq: Every day | ORAL | Status: DC
  Administered 2013-02-07 – 2013-02-09 (×3): 250 ug via ORAL
  Filled 2013-02-06 (×4): qty 2

## 2013-02-06 MED ORDER — 0.9 % SODIUM CHLORIDE (POUR BTL) OPTIME
TOPICAL | Status: DC | PRN
  Administered 2013-02-06: 1000 mL

## 2013-02-06 MED ORDER — NALOXONE HCL 0.4 MG/ML IJ SOLN: 0.4000 mg | INTRAMUSCULAR | Status: DC | PRN

## 2013-02-06 MED ORDER — ONDANSETRON HCL 4 MG/2ML IJ SOLN: 4.0000 mg | Freq: Once | INTRAMUSCULAR | Status: DC | PRN

## 2013-02-06 MED ORDER — LACTATED RINGERS IV SOLN
INTRAVENOUS | Status: DC | PRN
  Administered 2013-02-06 (×3): via INTRAVENOUS

## 2013-02-06 MED ORDER — DEXTROSE 5 % IV SOLN
1.0000 g | INTRAVENOUS | Status: AC
  Administered 2013-02-06: 1 g via INTRAVENOUS
  Filled 2013-02-06: qty 10

## 2013-02-06 MED ORDER — ALBUTEROL SULFATE (5 MG/ML) 0.5% IN NEBU: 2.5000 mg | INHALATION_SOLUTION | RESPIRATORY_TRACT | Status: DC | PRN

## 2013-02-06 SURGICAL SUPPLY — 102 items
BANDAGE ELASTIC 4 VELCRO ST LF (GAUZE/BANDAGES/DRESSINGS) ×1 IMPLANT
BANDAGE ELASTIC 6 VELCRO ST LF (GAUZE/BANDAGES/DRESSINGS) ×1 IMPLANT
BANDAGE ESMARK 6X9 LF (GAUZE/BANDAGES/DRESSINGS) ×1 IMPLANT
BANDAGE GAUZE ELAST BULKY 4 IN (GAUZE/BANDAGES/DRESSINGS) ×1 IMPLANT
BIT DRILL 100X2.5XANTM LCK (BIT) IMPLANT
BIT DRILL 3.5 (BIT) ×1
BIT DRILL 3.5MM (BIT) IMPLANT
BIT DRILL CAL (BIT) IMPLANT
BIT DRILL WIN 3.0 (BIT) ×1 IMPLANT
BIT DRL 100X2.5XANTM LCK (BIT) ×1
BLADE SURG 10 STRL SS (BLADE) ×2 IMPLANT
BLADE SURG 15 STRL LF DISP TIS (BLADE) ×1 IMPLANT
BLADE SURG 15 STRL SS (BLADE) ×2
BLADE SURG ROTATE 9660 (MISCELLANEOUS) IMPLANT
BNDG CMPR 9X6 STRL LF SNTH (GAUZE/BANDAGES/DRESSINGS) ×1
BNDG COHESIVE 4X5 TAN STRL (GAUZE/BANDAGES/DRESSINGS) ×1 IMPLANT
BNDG ESMARK 6X9 LF (GAUZE/BANDAGES/DRESSINGS) ×2
BRUSH SCRUB DISP (MISCELLANEOUS) ×4 IMPLANT
CLOTH BEACON ORANGE TIMEOUT ST (SAFETY) ×2 IMPLANT
COVER MAYO STAND STRL (DRAPES) ×1 IMPLANT
DRAPE C-ARM 42X72 X-RAY (DRAPES) ×2 IMPLANT
DRAPE C-ARMOR (DRAPES) ×2 IMPLANT
DRAPE INCISE IOBAN 66X45 STRL (DRAPES) ×2 IMPLANT
DRAPE ORTHO SPLIT 77X108 STRL (DRAPES)
DRAPE SURG ORHT 6 SPLT 77X108 (DRAPES) IMPLANT
DRAPE U-SHAPE 47X51 STRL (DRAPES) ×2 IMPLANT
DRILL BIT 2.5MM (BIT) ×2
DRILL BIT 3.5MM (BIT) ×2
DRILL BIT CAL (BIT) ×6
DRSG ADAPTIC 3X8 NADH LF (GAUZE/BANDAGES/DRESSINGS) ×1 IMPLANT
DRSG PAD ABDOMINAL 8X10 ST (GAUZE/BANDAGES/DRESSINGS) ×5 IMPLANT
ELECT REM PT RETURN 9FT ADLT (ELECTROSURGICAL) ×2
ELECTRODE REM PT RTRN 9FT ADLT (ELECTROSURGICAL) ×1 IMPLANT
EVACUATOR 1/8 PVC DRAIN (DRAIN) IMPLANT
EVACUATOR 3/16  PVC DRAIN (DRAIN)
EVACUATOR 3/16 PVC DRAIN (DRAIN) IMPLANT
GLOVE BIO SURGEON STRL SZ7.5 (GLOVE) ×2 IMPLANT
GLOVE BIO SURGEON STRL SZ8 (GLOVE) ×3 IMPLANT
GLOVE BIOGEL PI IND STRL 7.0 (GLOVE) IMPLANT
GLOVE BIOGEL PI IND STRL 7.5 (GLOVE) ×1 IMPLANT
GLOVE BIOGEL PI IND STRL 8 (GLOVE) ×1 IMPLANT
GLOVE BIOGEL PI INDICATOR 7.0 (GLOVE) ×2
GLOVE BIOGEL PI INDICATOR 7.5 (GLOVE) ×1
GLOVE BIOGEL PI INDICATOR 8 (GLOVE) ×1
GLOVE SURG SS PI 6.5 STRL IVOR (GLOVE) ×1 IMPLANT
GOWN PREVENTION PLUS XLARGE (GOWN DISPOSABLE) ×2 IMPLANT
GOWN STRL NON-REIN LRG LVL3 (GOWN DISPOSABLE) ×3 IMPLANT
IMMOBILIZER KNEE 22 UNIV (SOFTGOODS) ×1 IMPLANT
K-WIRE ACE 1.6X6 (WIRE) ×2
KIT BASIN OR (CUSTOM PROCEDURE TRAY) ×2 IMPLANT
KIT ROOM TURNOVER OR (KITS) ×2 IMPLANT
KWIRE ACE 1.6X6 (WIRE) IMPLANT
MANIFOLD NEPTUNE II (INSTRUMENTS) ×2 IMPLANT
NAIL FLEX WIN 3.0MM (Nail) ×1 IMPLANT
NDL SUT .5 MAYO 1.404X.05X (NEEDLE) IMPLANT
NEEDLE 22X1 1/2 (OR ONLY) (NEEDLE) IMPLANT
NEEDLE MAYO TAPER (NEEDLE)
NS IRRIG 1000ML POUR BTL (IV SOLUTION) ×2 IMPLANT
PACK ORTHO EXTREMITY (CUSTOM PROCEDURE TRAY) ×2 IMPLANT
PAD ARMBOARD 7.5X6 YLW CONV (MISCELLANEOUS) ×4 IMPLANT
PAD CAST 4YDX4 CTTN HI CHSV (CAST SUPPLIES) ×1 IMPLANT
PADDING CAST ABS 4INX4YD NS (CAST SUPPLIES) ×2
PADDING CAST ABS 6INX4YD NS (CAST SUPPLIES) ×2
PADDING CAST ABS COTTON 4X4 ST (CAST SUPPLIES) IMPLANT
PADDING CAST ABS COTTON 6X4 NS (CAST SUPPLIES) IMPLANT
PADDING CAST COTTON 4X4 STRL (CAST SUPPLIES)
PADDING CAST COTTON 6X4 STRL (CAST SUPPLIES) ×1 IMPLANT
PLATE ANTDIST TIBIA  LF WD 12H (Plate) ×1 IMPLANT
SCREW CORT FT 32X3.5XNONLOCK (Screw) IMPLANT
SCREW CORTICAL 3.5MM  32MM (Screw) ×1 IMPLANT
SCREW CORTICAL 3.5MM  34MM (Screw) ×1 IMPLANT
SCREW CORTICAL 3.5MM 32MM (Screw) ×1 IMPLANT
SCREW CORTICAL 3.5MM 34MM (Screw) IMPLANT
SCREW LOCK CORT STAR 3.5X28 (Screw) ×2 IMPLANT
SCREW LOCK CORT STAR 3.5X42 (Screw) ×1 IMPLANT
SCREW LOCK CORT STAR 3.5X46 (Screw) ×1 IMPLANT
SCREW LOCK CORT STAR 3.5X52 (Screw) ×3 IMPLANT
SCREW LOCK CORT STAR 3.5X54 (Screw) ×1 IMPLANT
SCREW LOCK CORT STAR 3.5X56 (Screw) ×1 IMPLANT
SCREW LP 3.5X60MM (Screw) ×1 IMPLANT
SPLINT PLASTER CAST XFAST 5X30 (CAST SUPPLIES) IMPLANT
SPLINT PLASTER XFAST SET 5X30 (CAST SUPPLIES) ×1
SPONGE GAUZE 4X4 12PLY (GAUZE/BANDAGES/DRESSINGS) ×1 IMPLANT
SPONGE LAP 18X18 X RAY DECT (DISPOSABLE) ×2 IMPLANT
STAPLER VISISTAT 35W (STAPLE) ×2 IMPLANT
STOCKINETTE IMPERVIOUS LG (DRAPES) ×2 IMPLANT
SUCTION FRAZIER TIP 10 FR DISP (SUCTIONS) ×2 IMPLANT
SUT ETHILON 3 0 PS 1 (SUTURE) IMPLANT
SUT PROLENE 0 CT 2 (SUTURE) ×4 IMPLANT
SUT VIC AB 0 CT1 27 (SUTURE) ×2
SUT VIC AB 0 CT1 27XBRD ANBCTR (SUTURE) ×1 IMPLANT
SUT VIC AB 1 CT1 27 (SUTURE) ×4
SUT VIC AB 1 CT1 27XBRD ANBCTR (SUTURE) ×1 IMPLANT
SUT VIC AB 2-0 CT1 27 (SUTURE) ×4
SUT VIC AB 2-0 CT1 TAPERPNT 27 (SUTURE) ×2 IMPLANT
SYR 20ML ECCENTRIC (SYRINGE) IMPLANT
TOWEL OR 17X24 6PK STRL BLUE (TOWEL DISPOSABLE) ×2 IMPLANT
TOWEL OR 17X26 10 PK STRL BLUE (TOWEL DISPOSABLE) ×4 IMPLANT
TRAY FOLEY CATH 14FR (SET/KITS/TRAYS/PACK) IMPLANT
TUBE CONNECTING 12X1/4 (SUCTIONS) ×2 IMPLANT
WATER STERILE IRR 1000ML POUR (IV SOLUTION) ×2 IMPLANT
YANKAUER SUCT BULB TIP NO VENT (SUCTIONS) ×2 IMPLANT

## 2013-02-06 NOTE — Progress Notes (Signed)
Patient refused CPAP at this time.  Stated that he wants to wait until tomorrow night.

## 2013-02-06 NOTE — Brief Op Note (Signed)
02/02/2013 - 02/06/2013  10:41 AM  PATIENT:  [name]  67 y.o. male  PRE-OPERATIVE DIAGNOSIS:  fracture right tibial shaft/fibula  POST-OPERATIVE DIAGNOSIS:  fracture right tibial shaft/fibula  PROCEDURE:  Procedure(s): OPEN REDUCTION INTERNAL FIXATION (ORIF) TIBIA FRACTURE WITH IM ROD FIBULA (Right)  SURGEON:  Surgeon(s) and Role:    * Budd Palmer, MD - Primary  PHYSICIAN ASSISTANT: Montez Morita, [facility]  ANESTHESIA:   general  EBL:  Total I/O In: 2250 [I.V.:2000; IV Piggyback:250] Out: 650 [Urine:650]  BLOOD ADMINISTERED:none  DRAINS: none   LOCAL MEDICATIONS USED:  NONE  SPECIMEN:  No Specimen  DISPOSITION OF SPECIMEN:  N/A  COUNTS:  YES  TOURNIQUET:    DICTATION: .Other Dictation: Dictation Number [id number]  PLAN OF CARE: Admit to inpatient   PATIENT DISPOSITION:  PACU - hemodynamically stable.   Delay start of Pharmacological VTE agent (>24hrs) due to surgical blood loss or risk of bleeding: no

## 2013-02-06 NOTE — Consult Note (Signed)
Orthopaedic Trauma Service Consultation  Reason for Consult:R tibi fib frx Referring Physician: [name] is an 67 y.o. male.  HPI: Multiple med probs with prior fractures and nonunions managed by Drs. Charlann Boxer and Chisholm, fell on deck sustaining pain, deformity of right leg initially seen and eval'd by Dr. Ranell Patrick, who felt that given previous bone healing difficulty and the ipsilateral TKA and skin issues, that treating this  fracture was outside his scope of practice.  Dr. Ranell Patrick then requested evaluation and definitive management by the orthopaedic trauma service, asserting that these injuries were best treated by a fellowship trained orthopaedic traumatologist.     Past Medical History  Diagnosis Date  . Hypertension     patient states he does not have high blood pressure  . Sleep apnea     cpap machine  . Acid reflux   . A-fib   . Thyroid disease   . Low testosterone, possible hypogonadism 10/01/2012  . Lumbago   . High cholesterol   . Anxiety   . Depression   . CAD (coronary artery disease)     Past Surgical History  Procedure Laterality Date  . Knee surgery  2012  . Appendectomy    . Right femur surgery  05/2012  . Orif wrist fracture  10/02/2012    Procedure: OPEN REDUCTION INTERNAL FIXATION (ORIF) WRIST FRACTURE;  Surgeon: Dominica Severin, MD;  Location: [facility];  Service: Orthopedics;  Laterality: Right;  WITH   ANTIBIOTIC  CEMENT  . Total hip revision  10/05/2012    Procedure: TOTAL HIP REVISION;  Surgeon: Shelda Pal, MD;  Location: [facility];  Service: Orthopedics;  Laterality: Right;  RIGHT TOTAL HIP REVISION  . Hardware removal  10/05/2012    Procedure: HARDWARE REMOVAL;  Surgeon: Shelda Pal, MD;  Location: [facility];  Service: Orthopedics;  Laterality: Right;  REMOVING  STRYKER  GAMMA NAIL  . Wrist fracture surgery  05/2012    Family History  Problem Relation Age of Onset  . CAD Father 80  . Arthritis Mother     Social History:  reports that he  quit smoking about 8 months ago. He does not have any smokeless tobacco history on file. He reports that  drinks alcohol. He reports that he does not use illicit drugs.  Allergies: No Known Allergies  Medications: I have reviewed the patient's current medications.  Results for orders placed during the hospital encounter of 02/02/13 (from the past 48 hour(s))  GLUCOSE, CAPILLARY     Status: Abnormal   Collection Time    02/05/13  9:55 PM      Result Value Range   Glucose-Capillary 127 (*) 70 - 99 mg/dL  CBC     Status: Abnormal   Collection Time    02/06/13  5:46 AM      Result Value Range   WBC 6.8  4.0 - 10.5 K/uL   RBC 4.01 (*) 4.22 - 5.81 MIL/uL   Hemoglobin 10.8 (*) 13.0 - 17.0 g/dL   HCT 45.4 (*) 09.8 - 11.9 %   MCV 81.3  78.0 - 100.0 fL   MCH 26.9  26.0 - 34.0 pg   MCHC 33.1  30.0 - 36.0 g/dL   RDW 14.7 (*) 82.9 - 56.2 %   Platelets 270  150 - 400 K/uL  BASIC METABOLIC PANEL     Status: Abnormal   Collection Time    02/06/13  5:46 AM      Result Value Range  Sodium 135  135 - 145 mEq/L   Potassium 4.2  3.5 - 5.1 mEq/L   Chloride 100  96 - 112 mEq/L   CO2 28  19 - 32 mEq/L   Glucose, Bld 112 (*) 70 - 99 mg/dL   BUN 19  6 - 23 mg/dL   Creatinine, Ser 7.84  0.50 - 1.35 mg/dL   Calcium 8.5  8.4 - 69.6 mg/dL   GFR calc non Af Amer 85 (*) >90 mL/min   GFR calc Af Amer >90  >90 mL/min   Comment:            The eGFR has been calculated     using the CKD EPI equation.     This calculation has not been     validated in all clinical     situations.     eGFR's persistently     <90 mL/min signify     possible Chronic Kidney Disease.  GLUCOSE, CAPILLARY     Status: Abnormal   Collection Time    02/06/13  5:50 AM      Result Value Range   Glucose-Capillary 104 (*) 70 - 99 mg/dL    No results found.  ROS as above  Blood pressure 116/62, pulse 71, temperature 97.8 F (36.6 C), temperature source Oral, resp. rate 18, height 6\' 4"  (1.93 m), weight 318 lb (144.244 kg),  SpO2 95.00%.   Assessment/Plan: R tibi fib fracture  Percutaneous plating and IM rodding of the fibula  I discussed with the patient the risks and benefits of surgery, including the possibility of infection, nerve injury, vessel injury, wound breakdown, symptomatic hardware, DVT/ PE, loss of motion, nonunion, malunion, and need for further surgery among others.  He understood these risks and wished to proceed.    Myrene Galas, MD [facility] [phone] (p)   02/06/2013  8:03 AM

## 2013-02-06 NOTE — Preoperative (Signed)
Beta Blockers   Reason not to administer Beta Blockers:Not Applicable, Pt takes beta blocker daily at noon, last dose 02/05/13

## 2013-02-06 NOTE — Anesthesia Postprocedure Evaluation (Signed)
  Anesthesia Post-op Note  Patient: [name]  Procedure(s) Performed: Procedure(s): OPEN REDUCTION INTERNAL FIXATION (ORIF) TIBIA FRACTURE WITH IM ROD FIBULA (Right)  Patient Location: PACU  Anesthesia Type:General  Level of Consciousness: awake, alert , oriented and patient cooperative  Airway and Oxygen Therapy: Patient Spontanous Breathing  Post-op Pain: mild  Post-op Assessment: Post-op Vital signs reviewed, Patient's Cardiovascular Status Stable, Respiratory Function Stable, Patent Airway, No signs of Nausea or vomiting and Pain level controlled  Post-op Vital Signs: stable  Complications: No apparent anesthesia complications

## 2013-02-06 NOTE — Transfer of Care (Signed)
Immediate Anesthesia Transfer of Care Note  Patient: [name]  Procedure(s) Performed: Procedure(s): OPEN REDUCTION INTERNAL FIXATION (ORIF) TIBIA FRACTURE WITH IM ROD FIBULA (Right)  Patient Location: PACU  Anesthesia Type:General  Level of Consciousness: awake, alert , oriented and patient cooperative  Airway & Oxygen Therapy: Patient Spontanous Breathing and Patient connected to nasal cannula oxygen  Post-op Assessment: Report given to PACU RN, Post -op Vital signs reviewed and stable and Patient moving all extremities X 4  Post vital signs: Reviewed and stable  Complications: No apparent anesthesia complications

## 2013-02-06 NOTE — Progress Notes (Addendum)
[facility] PROGRESS NOTE  [name] ZOX:[id number] DOB: 04-Feb-1946 DOA: 02/02/2013 PCP: Lupita Raider, MD  Assessment/Plan  Comminuted impacted mildly angulated fractures of the distal third of the tibial and fibular diaphyses in setting of recurrent fractures  - POD 0 s/p ORIF tibia fracture with IM rod fibula - Vitamin d level normal - Outpatient DEXA   Back pain: Denies radiation down his legs or changes in bowel or bladder function, but has been persistent  - Further evaluation per orthopedics.   CAD, stable. No clear evidence of angina. No valvular problems and preserved EF. RCRI 1-2 (if undiagnosed diabetes) = 1% - 2.4%  - Continue beta blocker and statin perioperatively.  - Continue ranolazine  - xarelto restarted  Atrial fibrillation, stable.  Mostly NSR on telemetry with brief episode of rate-controlled a-fib -  Continue telemetry x 24 hours post-surgery and if no significant arrhythmias, may discontinue - Goal HR < 100bpm  - Continue dilt, beta blocker, and amiodarone  - xarelto restarted   HTN, blood pressures stable  HLD, stable, continue statin   Dyspnea on exertion, likely due to deconditioning in the setting of obesity. May have some underlying COPD due to cigarette use history, however, lungs are hypoinflated on CXR  - Albuterol prn  - Incentive spirometry  - Encourage OOB as much as tolerated   OSA, stable. Continue CPAP at night   Hyperglycemia and borderline diabetes.  A1c 5.8  - d/c CBG and insulin  Hypothyroidism, TSH 5.383, still elevated from goal -  Increase synthroid to daily and repeat TSH in 4-6 weeks  E. Coli UTI, asymptomatic. Agree with ceftriaxone day 2 Gingival disease. Stable. Continue doxycycline  Right hand osteomyelitis with hardware in place. Continue bactrim.  Psoriasis. Hold Enbrel  Anxiety and depression, stable for now. Continue home medications  Constipation, chronic problem and likely acutely worse due to  narcotics.  Continue bowel regimen   Anemia of chronic disease, baseline hgb near 10.  hgb at baseline.  Anemia w/u per PCP BPH:  Voiding well.  Continue flomax  Procedures:  Right ORIF tibia and IM rod fibula 4/29  Antibiotics:  Ceftriaxone 4/28 >>   Doxycycline ongoing through discharge  Bactrim ongoing through discharge  HPI/Subjective:  Denies fevers, chills, headache.  Denies chest pain and shortness of breath.  Denies nausea, vomiting, diarrhea, constipation.  Ate dinner.  Continues to have pain in the right lower extremity.    Objective: Filed Vitals:   02/06/13 1235 02/06/13 1240 02/06/13 1307 02/06/13 1514  BP: 136/58  135/57   Pulse:  86 82   Temp:  97.5 F (36.4 C) 97.5 F (36.4 C)   TempSrc:      Resp:  16  12  Height:      Weight:      SpO2:  96% 96% 96%    Intake/Output Summary (Last 24 hours) at 02/06/13 1722 Last data filed at 02/06/13 1244  Gross per 24 hour  Intake   3870 ml  Output   2250 ml  Net   1620 ml   Filed Weights   02/03/13 0220  Weight: 144.244 kg (318 lb)    Exam:   General:  CM, No acute distress  HEENT:  NCAT, MMM  Cardiovascular:  RRR, nl S1, S2 no mrg, 2+ pulses, warm extremities  Respiratory:  CTAB, no increased WOB  Abdomen:   NABS, soft, NT/ND  MSK:   Normal tone and bulk, no LEE.   Right leg in splint.  Toes warm and well perfused.    Neuro:   Stable weakness of grip RUE as before.  Right lower extremity not tested.  Left side grossly intact.    Data Reviewed: Basic Metabolic Panel:  Recent Labs Lab 02/03/13 0401 02/06/13 0546  NA 137 135  K 3.9 4.2  CL 100 100  CO2 27 28  GLUCOSE 158* 112*  BUN 17 19  CREATININE 1.02 0.93  CALCIUM 8.7 8.5   Liver Function Tests: No results found for this basename: AST, ALT, ALKPHOS, BILITOT, PROT, ALBUMIN,  in the last 168 hours No results found for this basename: LIPASE, AMYLASE,  in the last 168 hours No results found for this basename: AMMONIA,  in the last  168 hours CBC:  Recent Labs Lab 02/06/13 0546  WBC 6.8  HGB 10.8*  HCT 32.6*  MCV 81.3  PLT 270   Cardiac Enzymes: No results found for this basename: CKTOTAL, CKMB, CKMBINDEX, TROPONINI,  in the last 168 hours BNP (last 3 results) No results found for this basename: PROBNP,  in the last 8760 hours CBG:  Recent Labs Lab 02/05/13 2155 02/06/13 0550 02/06/13 1614  GLUCAP 127* 104* 97    Recent Results (from the past 240 hour(s))  URINE CULTURE     Status: None   Collection Time    02/03/13  3:53 AM      Result Value Range Status   Specimen Description URINE, RANDOM   Final   Special Requests NONE   Final   Culture  Setup Time 02/03/2013 11:57   Final   Colony Count >=100,000 COLONIES/ML   Final   Culture ESCHERICHIA COLI   Final   Report Status 02/05/2013 FINAL   Final   Organism ID, Bacteria ESCHERICHIA COLI   Final  SURGICAL PCR SCREEN     Status: None   Collection Time    02/03/13  4:37 AM      Result Value Range Status   MRSA, PCR NEGATIVE  NEGATIVE Final   Staphylococcus aureus NEGATIVE  NEGATIVE Final   Comment:            The Xpert SA Assay (FDA     approved for NASAL specimens     in patients over 42 years of age),     is one component of     a comprehensive surveillance     program.  Test performance has     been validated by [employer] for patients greater     than or equal to 38 year old.     It is not intended     to diagnose infection nor to     guide or monitor treatment.     Studies: Dg Tibia/fibula Right  02/06/2013  *RADIOLOGY REPORT*  Clinical Data: For fixation.  DG C-ARM 61-120 MIN, RIGHT TIBIA AND FIBULA - 2 VIEW  Comparison: Plain films 02/03/2013.  Findings: We are provided with seven fluoroscopic spot views of the right lower leg.  Images demonstrate placement of medial plate and screws for fixation of a distal tibial fracture and a retrograde IM nail for fixation of a fibular fracture.  Position and alignment are markedly  improved.  No acute finding.  IMPRESSION: ORIF distal tibial and fibular fractures.   Original Report Authenticated By: Holley Dexter, M.D.    X-ray Tibia Fibula Right Ap And Lateral  02/06/2013  *RADIOLOGY REPORT*  Clinical Data: Postop ORIF.  RIGHT TIBIA AND FIBULA - 2 VIEW  Comparison: Intraoperative imaging 02/06/2013  Findings: Plate and screw fixation across the distal tibial fracture.  There is a wire within the mid and distal fibula.  No hardware complicating feature.  Small displaced bone fragment anteriorly on the lateral view, likely from the distal tibia. Changes of right knee replacement noted.  IMPRESSION: Internal fixation with near anatomic alignment.   Original Report Authenticated By: Charlett Nose, M.D.    Dg C-arm [phone] Min  02/06/2013  *RADIOLOGY REPORT*  Clinical Data: For fixation.  DG C-ARM 61-120 MIN, RIGHT TIBIA AND FIBULA - 2 VIEW  Comparison: Plain films 02/03/2013.  Findings: We are provided with seven fluoroscopic spot views of the right lower leg.  Images demonstrate placement of medial plate and screws for fixation of a distal tibial fracture and a retrograde IM nail for fixation of a fibular fracture.  Position and alignment are markedly improved.  No acute finding.  IMPRESSION: ORIF distal tibial and fibular fractures.   Original Report Authenticated By: Holley Dexter, M.D.     Scheduled Meds: . amiodarone  100 mg Oral BID  . [START ON 02/07/2013] cefTRIAXone (ROCEPHIN)  IV  1 g Intravenous Q24H  . cholecalciferol  2,000 Units Oral Daily  . clonazePAM  2 mg Oral TID  . diltiazem  240 mg Oral Daily  . docusate sodium  100 mg Oral BID  . doxycycline  100 mg Oral Q12H  . DULoxetine  60 mg Oral Daily  . enoxaparin (LOVENOX) injection  40 mg Subcutaneous Once  . HYDROmorphone      . insulin aspart  0-9 Units Subcutaneous TID WC  . levothyroxine  225 mcg Oral QAC breakfast  . loratadine  10 mg Oral Daily  . metoprolol tartrate  25 mg Oral Q1200  . morphine    Intravenous Q4H  . multivitamin with minerals  1 tablet Oral Daily  . oxyCODONE      . pantoprazole  40 mg Oral Daily  . polyethylene glycol  17 g Oral BID  . ranolazine  500 mg Oral BID  . [START ON 02/07/2013] rivaroxaban  20 mg Oral Q supper  . rosuvastatin  40 mg Oral q1800  . sulfamethoxazole-trimethoprim  1 tablet Oral Q12H  . tamsulosin  0.4 mg Oral Daily  . vitamin C  250 mg Oral Daily   Continuous Infusions: . sodium chloride 20 mL/hr at 02/06/13 1359    Principal Problem:   Right tibial fracture Active Problems:   Atrial fibrillation   Hypothyroidism   History of recurrent UTIs   Normocytic anemia   Low testosterone, possible hypogonadism   HTN (hypertension)   Psoriasis   Dyslipidemia   GERD (gastroesophageal reflux disease)   Hyperglycemia   Anxiety   Depression   CAD (coronary artery disease)   Chronic gingivitis   Osteomyelitis of right wrist   Obesity, unspecified    Time spent: 30 min    Kraig Genis  [facility] Pager [phone]. If 7PM-7AM, please contact night-coverage at [email or web address], password [facility] 02/06/2013, 5:22 PM  LOS: 4 days

## 2013-02-06 NOTE — Anesthesia Preprocedure Evaluation (Addendum)
Anesthesia Evaluation  Patient identified by MRN, date of birth, ID band Patient awake    Reviewed: Allergy & Precautions, H&P , NPO status , Patient's Chart, lab work & pertinent test results, reviewed documented beta blocker date and time   Airway Mallampati: I TM Distance: >3 FB Neck ROM: full    Dental  (+) Dental Advisory Given   Pulmonary sleep apnea and Continuous Positive Airway Pressure Ventilation , former smoker,          Cardiovascular hypertension, Pt. on medications and Pt. on home beta blockers + CAD + dysrhythmias Atrial Fibrillation Rhythm:irregular Rate:Normal     Neuro/Psych Anxiety Depression    GI/Hepatic GERD-  Medicated and Controlled,  Endo/Other  Hypothyroidism   Renal/GU      Musculoskeletal   Abdominal   Peds  Hematology  (+) Blood dyscrasia, anemia ,   Anesthesia Other Findings   Reproductive/Obstetrics                          Anesthesia Physical Anesthesia Plan  ASA: III  Anesthesia Plan: General   Post-op Pain Management:    Induction: Intravenous  Airway Management Planned: Oral ETT  Additional Equipment:   Intra-op Plan:   Post-operative Plan: Extubation in OR  Informed Consent: I have reviewed the patients History and Physical, chart, labs and discussed the procedure including the risks, benefits and alternatives for the proposed anesthesia with the patient or authorized representative who has indicated his/her understanding and acceptance.     Plan Discussed with: CRNA, Anesthesiologist and Surgeon  Anesthesia Plan Comments:         Anesthesia Quick Evaluation

## 2013-02-06 NOTE — Anesthesia Procedure Notes (Signed)
Procedure Name: Intubation Date/Time: 02/06/2013 8:19 AM Performed by: Rogelia Boga Pre-anesthesia Checklist: Emergency Drugs available, Suction available, Patient identified, Patient being monitored and Timeout performed Patient Re-evaluated:Patient Re-evaluated prior to inductionOxygen Delivery Method: Circle system utilized Preoxygenation: Pre-oxygenation with 100% oxygen Intubation Type: IV induction Ventilation: Mask ventilation without difficulty Laryngoscope Size: Mac and 4 Grade View: Grade II Tube type: Oral Tube size: 7.5 mm Airway Equipment and Method: Stylet and LTA kit utilized Placement Confirmation: ETT inserted through vocal cords under direct vision,  positive ETCO2 and breath sounds checked- equal and bilateral Secured at: 23 cm Tube secured with: Tape Dental Injury: Teeth and Oropharynx as per pre-operative assessment

## 2013-02-07 ENCOUNTER — Encounter (HOSPITAL_COMMUNITY): Payer: Self-pay | Admitting: Orthopedic Surgery

## 2013-02-07 ENCOUNTER — Inpatient Hospital Stay (HOSPITAL_COMMUNITY): Payer: Medicare Other

## 2013-02-07 LAB — COMPREHENSIVE METABOLIC PANEL
ALT: 12 U/L (ref 0–53)
AST: 12 U/L (ref 0–37)
Albumin: 2.5 g/dL — ABNORMAL LOW (ref 3.5–5.2)
Alkaline Phosphatase: 154 U/L — ABNORMAL HIGH (ref 39–117)
Calcium: 8.4 mg/dL (ref 8.4–10.5)
Potassium: 4.2 mEq/L (ref 3.5–5.1)
Sodium: 134 mEq/L — ABNORMAL LOW (ref 135–145)
Total Protein: 5.6 g/dL — ABNORMAL LOW (ref 6.0–8.3)

## 2013-02-07 LAB — CBC
MCH: 27.1 pg (ref 26.0–34.0)
MCHC: 33.6 g/dL (ref 30.0–36.0)
Platelets: 264 10*3/uL (ref 150–400)

## 2013-02-07 LAB — GLUCOSE, CAPILLARY: Glucose-Capillary: 141 mg/dL — ABNORMAL HIGH (ref 70–99)

## 2013-02-07 LAB — TESTOSTERONE: Testosterone: 289 ng/dL — ABNORMAL LOW (ref 300–890)

## 2013-02-07 NOTE — Evaluation (Signed)
Physical Therapy Evaluation Patient Details Name: [name] MRN: [id number] DOB: 14-Feb-1946 Today's Date: 02/07/2013 Time: 0865-7846 PT Time Calculation (min): 23 min  PT Assessment / Plan / Recommendation Clinical Impression  Pt is a 67 y.o. male who is s/p R ORIF R LE and R wrist fx. Pt presents with deficits in balance,  mobility, transfers and overall decreased activity tolerance. Pt limited by pain and fatigue with therapy today. Will benefit from skilled PT to maximize functional mobility. Recommend ST-SNF, upon D/C and will need W/C for mobility.      PT Assessment  Patient needs continued PT services    Follow Up Recommendations  SNF    Does the patient have the potential to tolerate intense rehabilitation      Barriers to Discharge        Equipment Recommendations  Wheelchair (measurements PT);Wheelchair cushion (measurements PT)    Recommendations for Other Services     Frequency Min 3X/week    Precautions / Restrictions Precautions Precautions: Fall Precaution Comments: Pt verbalized understanding of NWB status on R LE and R wrist. Restrictions Weight Bearing Restrictions: Yes RLE Weight Bearing: Non weight bearing   Pertinent Vitals/Pain 3/10; pt using PCA PRN.       Mobility  Bed Mobility Bed Mobility: Supine to Sit;Sitting - Scoot to Edge of Bed Supine to Sit: With rails;HOB elevated;3: Mod assist Sitting - Scoot to Edge of Bed: 4: Min assist;With rail Details for Bed Mobility Assistance: pt required assistance to advance R LE on to/ off EOB and to advance R UE to sitting position secondary to pain and NWB status on R wrist and R LE. Pt anxious and required max encouragement secondary to fear and pain  Transfers Transfers: Sit to Stand;Stand to Sit;Stand Pivot Transfers Sit to Stand: 1: +2 Total assist;From elevated surface;With upper extremity assist;From bed Sit to Stand: Patient Percentage: 60% Stand to Sit: 1: +2 Total assist;With  armrests;With upper extremity assist;To chair/3-in-1 Stand to Sit: Patient Percentage: 50% Stand Pivot Transfers: 1: +2 Total assist;From elevated surface Stand Pivot Transfers: Patient Percentage: 50% Details for Transfer Assistance: pt required vc's for hand placement, sequencing and safety with platform RW; pt required max encouragement; and continuous cues, pt able to maintain NWB status on R LE; demo balance deficits and required assistance to balance Ambulation/Gait Ambulation/Gait Assistance: Not tested (comment) Stairs: No Wheelchair Mobility Wheelchair Mobility: No    Exercises     PT Diagnosis: Difficulty walking;Acute pain  PT Problem List: Decreased strength;Decreased range of motion;Decreased activity tolerance;Decreased balance;Decreased mobility;Decreased knowledge of use of DME;Decreased safety awareness;Decreased knowledge of precautions;Pain PT Treatment Interventions:     PT Goals Acute Rehab PT Goals PT Goal Formulation: With patient Time For Goal Achievement: 02/14/13 Potential to Achieve Goals: Good Pt will Roll Supine to Left Side: with modified independence PT Goal: Rolling Supine to Left Side - Progress: Goal set today Pt will go Supine/Side to Sit: with modified independence PT Goal: Supine/Side to Sit - Progress: Goal set today Pt will go Sit to Supine/Side: with modified independence PT Goal: Sit to Supine/Side - Progress: Goal set today Pt will go Sit to Stand: with min assist PT Goal: Sit to Stand - Progress: Goal set today Pt will go Stand to Sit: with min assist PT Goal: Stand to Sit - Progress: Goal set today Pt will Transfer Bed to Chair/Chair to Bed: with min assist PT Transfer Goal: Bed to Chair/Chair to Bed - Progress: Goal set today Pt will Ambulate:  16 - 50 feet;with min assist;with rolling walker PT Goal: Ambulate - Progress: Goal set today  Visit Information  Last PT Received On: 02/07/13 Assistance Needed: +2 PT/OT  Co-Evaluation/Treatment: Yes    Subjective Data  Subjective: "That walker isnt going to work. Im not sure if I can hop. We can try, I guess."  Patient Stated Goal: to go to [facility]    Prior Functioning  Home Living Lives With: Significant other Available Help at Discharge: Family;Available 24 hours/day Type of Home: House Home Access: Level entry Home Layout: One level Bathroom Shower/Tub: Tub/shower unit;Walk-in shower Bathroom Toilet: Handicapped height Bathroom Accessibility: Yes How Accessible: Accessible via walker (bathroom with walk in shower) Home Adaptive Equipment: Walker - rolling;Tub transfer bench Prior Function Level of Independence: Needs assistance Needs Assistance: Bathing;Meal Prep Bath: Minimal Communication Communication: No difficulties Dominant Hand: Left    Cognition  Cognition Arousal/Alertness: Awake/alert Behavior During Therapy: WFL for tasks assessed/performed Overall Cognitive Status: Within Functional Limits for tasks assessed    Extremity/Trunk Assessment Right Lower Extremity Assessment RLE ROM/Strength/Tone: Deficits;Unable to fully assess;Due to pain;Due to precautions RLE ROM/Strength/Tone Deficits: unable to assess R DF/PF secondary to surgery and precautions; pt able to perform LAQ in R LE demo tremors RLE Sensation: Deficits RLE Sensation Deficits: diminished to light touch vs. L LE Left Lower Extremity Assessment LLE ROM/Strength/Tone: WFL for tasks assessed LLE Sensation: WFL - Light Touch Trunk Assessment Trunk Assessment: Normal   Balance    End of Session PT - End of Session Equipment Utilized During Treatment: Gait belt Activity Tolerance: Patient limited by fatigue;Patient limited by pain Patient left: in chair;with call bell/phone within reach Nurse Communication: Mobility status  GP     Donell Sievert, [place] [phone] 02/07/2013, 11:08 AM

## 2013-02-07 NOTE — Clinical Social Work Placement (Signed)
Clinical Social Work Department  CLINICAL SOCIAL WORK PLACEMENT NOTE  02/07/2013 Patient:[name]  Account Number: [id number] Admit date: 02/02/2013 Clinical Social Worker: Sabino Niemann LCSWA Date/time: 02/07/2013 11:30 AM  Clinical Social Work is seeking post-discharge placement for this patient at the following level of care: SKILLED NURSING (*CSW will update this form in Epic as items are completed)  02/07/2013 Patient/family provided with [facility] Department of Clinical Social Work's list of facilities offering this level of care within the geographic area requested by the patient (or if unable, by the patient's family).  02/07/2013 Patient/family informed of their freedom to choose among providers that offer the needed level of care, that participate in Medicare, Medicaid or managed care program needed by the patient, have an available bed and are willing to accept the patient.  02/07/2013 Patient/family informed of [facility]' ownership interest in [facility], as well as of the fact that they are under no obligation to receive care at this facility.  PASARR submitted to EDS on Pre-existing  PASARR number received from EDS on Pre-existing FL2 transmitted to all facilities in geographic area requested by pt/family on 02/07/2013 FL2 transmitted to all facilities within larger geographic area on  Patient informed that his/her managed care company has contracts with or will negotiate with certain facilities, including the following:  Patient/family informed of bed offers received:  Patient chooses bed at  Physician recommends and patient chooses bed at  Patient to be transferred to on  Patient to be transferred to facility by  The following physician request were entered in Epic:  Additional Comments:

## 2013-02-07 NOTE — Evaluation (Signed)
Occupational Therapy Evaluation Patient Details Name: [name] MRN: [id number] DOB: 1946-07-07 Today's Date: 02/07/2013 Time: 4098-1191 OT Time Calculation (min): 24 min  OT Assessment / Plan / Recommendation Clinical Impression    Pt is a 67 y.o. male who is s/p R ORIF R LE and R wrist fx. Pt presents with below problem list. Pt limited by pain and fatigue with therapy today.  Pt planning to d/c to SNF. OT not setting goals due to d/c plan to snf, however if plan changes, will set goals.      OT Assessment  Patient needs continued OT Services    Follow Up Recommendations  SNF;Supervision/Assistance - 24 hour    Barriers to Discharge      Equipment Recommendations  Other (comment) (defer to snf)    Recommendations for Other Services    Frequency       Precautions / Restrictions Precautions Precautions: Fall Precaution Comments: Pt verbalized understanding of NWB status on R LE and R wrist. Restrictions Weight Bearing Restrictions: Yes RLE Weight Bearing: Non weight bearing   Pertinent Vitals/Pain Pain 3/10; pt using PCA PRN.     ADL  Eating/Feeding: Independent Where Assessed - Eating/Feeding: Chair Grooming: Set up Where Assessed - Grooming: Supported sitting Upper Body Bathing: Set up Where Assessed - Upper Body Bathing: Supported sitting Lower Body Bathing: Maximal assistance Where Assessed - Lower Body Bathing: Supported sit to stand Upper Body Dressing: Set up Where Assessed - Upper Body Dressing: Supported sitting Lower Body Dressing: +1 Total assistance Where Assessed - Lower Body Dressing: Supported sit to Pharmacist, hospital: +2 Total assistance Toilet Transfer: Patient Percentage: 50% Statistician Method: Surveyor, minerals: Comfort height toilet Toileting - Clothing Manipulation and Hygiene: +1 Total assistance Where Assessed - Engineer, mining and Hygiene: Sit to stand from 3-in-1 or toilet Tub/Shower  Transfer Method: Not assessed Equipment Used: Gait belt;Other (comment) (platform walker) ADL Comments: Pt at MaxA/Total A level for LB ADLs with supported sit to stand transfer. Reports he uses AE at home to assist in LB ADLs.    OT Diagnosis: Acute pain;Generalized weakness  OT Problem List: Decreased strength;Decreased range of motion;Decreased activity tolerance;Impaired balance (sitting and/or standing);Decreased knowledge of use of DME or AE;Decreased knowledge of precautions;Pain OT Treatment Interventions: Self-care/ADL training;Therapeutic exercise;DME and/or AE instruction;Therapeutic activities;Patient/family education;Balance training   OT Goals    Visit Information  Last OT Received On: 02/07/13 Assistance Needed: +2 PT/OT Co-Evaluation/Treatment: Yes    Subjective Data      Prior Functioning     Home Living Lives With: Significant other Available Help at Discharge: Family;Available 24 hours/day Type of Home: House Home Access: Level entry Home Layout: One level Bathroom Shower/Tub: Tub/shower unit;Walk-in shower Bathroom Toilet: Handicapped height Bathroom Accessibility: Yes How Accessible: Accessible via walker (bathroom with walk in shower) Home Adaptive Equipment: Walker - rolling;Tub transfer bench Prior Function Level of Independence: Needs assistance Needs Assistance: Bathing;Meal Prep Bath: Minimal Communication Communication: No difficulties Dominant Hand: Left         Vision/Perception     Cognition  Cognition Arousal/Alertness: Awake/alert Behavior During Therapy: WFL for tasks assessed/performed Overall Cognitive Status: Within Functional Limits for tasks assessed    Extremity/Trunk Assessment Right Upper Extremity Assessment RUE ROM/Strength/Tone: WFL for tasks assessed Left Upper Extremity Assessment LUE ROM/Strength/Tone: WFL for tasks assessed (reports soreness in triceps at times)     Mobility Bed Mobility Bed Mobility:  Supine to Sit;Sitting - Scoot to Edge of Bed Supine to  Sit: With rails;HOB elevated;3: Mod assist Sitting - Scoot to Edge of Bed: 4: Min assist;With rail Details for Bed Mobility Assistance: pt required assistance to advance R LE on to/ off EOB and to advance R UE to sitting position secondary to pain and NWB status on R wrist and R LE. Pt anxious and required max encouragement secondary to fear and pain  Transfers Transfers: Sit to Stand;Stand to Sit Sit to Stand: 1: +2 Total assist;From elevated surface;With upper extremity assist;From bed Sit to Stand: Patient Percentage: 60% Stand to Sit: 1: +2 Total assist;With armrests;With upper extremity assist;To chair/3-in-1 Stand to Sit: Patient Percentage: 50% Details for Transfer Assistance: pt required vc's for hand placement, sequencing and safety with platform RW; pt required max encouragement; and continuous cues, pt able to maintain NWB status on R LE; demo balance deficits and required assistance to balance     Exercise     Balance     End of Session OT - End of Session Equipment Utilized During Treatment: Gait belt Activity Tolerance: Patient limited by fatigue;Patient limited by pain Patient left: in chair;with call bell/phone within reach  [employer] OTR/L [phone] 02/07/2013, 12:42 PM

## 2013-02-07 NOTE — Care Management Note (Signed)
CARE MANAGEMENT NOTE 02/07/2013  Patient:  [name], [name]   Account Number:  [id number]  Date Initiated:  02/07/2013  Documentation initiated by:  Vance Peper  Subjective/Objective Assessment:   67 yr old malae s/p right ankle ORIF.     Action/Plan:   Patient is for shortterm SNF. Social worker Amy Riley Kill is aware.   Anticipated DC Date:  02/07/2013   Anticipated DC Plan:  SKILLED NURSING FACILITY      DC Planning Services  CM consult      Choice offered to / List presented to:             Status of service:  Completed, signed off Medicare Important Message given?   (If response is "NO", the following Medicare IM given date fields will be blank) Date Medicare IM given:   Date Additional Medicare IM given:    Discharge Disposition:  SKILLED NURSING FACILITY  Per UR Regulation:    If discussed at Long Length of Stay Meetings, dates discussed:    Comments:

## 2013-02-07 NOTE — Progress Notes (Signed)
[facility] PROGRESS NOTE  [name] ZOX:[id number] DOB: June 09, 1946 DOA: 02/02/2013 PCP: Lupita Raider, MD  Assessment/Plan  Comminuted impacted mildly angulated fractures of the distal third of the tibial and fibular diaphyses in setting of recurrent fractures  - POD 1 s/p ORIF tibia fracture with IM rod fibula - Vitamin d level normal - Outpatient DEXA    CAD, stable. Continue beta blocker and statin perioperatively.  - Continue ranolazine  - xarelto restarted  Atrial fibrillation, stable.  - Goal HR < 100bpm  - Continue dilt, beta blocker, and amiodarone  - xarelto restarted   HTN, blood pressures stable  HLD, stable, continue statin   Dyspnea on exertion, likely due to deconditioning in the setting of obesity. May have some underlying COPD due to cigarette use history, however, lungs are hypoinflated on CXR  - Albuterol prn  - Incentive spirometry  - Encourage OOB as much as tolerated   OSA, stable. Continue CPAP at night   Hyperglycemia and borderline diabetes.  A1c 5.8  - d/c CBG and insulin  Hypothyroidism, TSH 5.383, still elevated from goal -  Increase synthroid to daily and repeat TSH in 4-6 weeks  E. Coli UTI, asymptomatic. Agree with ceftriaxone day 3. Consider nitrofurantoin at discharge for 2 more days.  Gingival disease. Stable. Continue doxycycline  Right hand osteomyelitis with hardware in place. Continue bactrim.  Psoriasis. Hold Enbrel  Constipation, chronic problem and likely acutely worse due to narcotics.  Continue bowel regimen   Anemia of chronic disease, baseline hgb near 10.  hgb at baseline.  Anemia w/u per PCP BPH:  Continue flomax  Discharge when ok by ortho.   Procedures:  Right ORIF tibia and IM rod fibula 4/29  Antibiotics:  Ceftriaxone 4/28 >>   Doxycycline ongoing through discharge  Bactrim ongoing through discharge  HPI/Subjective: Feeling well. Pain controlled.  No pain.   Objective: Filed Vitals:    02/07/13 0136 02/07/13 0414 02/07/13 0654 02/07/13 0800  BP: 132/56  117/49   Pulse: 76  77   Temp: 98.1 F (36.7 C)  97.4 F (36.3 C)   TempSrc: Oral  Oral   Resp: 16 20 16 18   Height:      Weight:      SpO2: 98% 95% 98% 92%    Intake/Output Summary (Last 24 hours) at 02/07/13 0935 Last data filed at 02/07/13 0653  Gross per 24 hour  Intake   2720 ml  Output   3250 ml  Net   -530 ml   Filed Weights   02/03/13 0220  Weight: 144.244 kg (318 lb)    Exam:   General:  , No acute distress  Cardiovascular:  RRR, nl S1, S2 no mrg, 2+ pulses,   Respiratory:  CTAB, no increased WOB  Abdomen:   NABS, soft, NT/ND  MSK:  Right LE with dressing.   Data Reviewed: Basic Metabolic Panel:  Recent Labs Lab 02/03/13 0401 02/06/13 0546 02/07/13 0610  NA 137 135 134*  K 3.9 4.2 4.2  CL 100 100 99  CO2 27 28 29   GLUCOSE 158* 112* 137*  BUN 17 19 15   CREATININE 1.02 0.93 0.89  CALCIUM 8.7 8.5 8.4   Liver Function Tests:  Recent Labs Lab 02/07/13 0610  AST 12  ALT 12  ALKPHOS 154*  BILITOT 0.4  PROT 5.6*  ALBUMIN 2.5*   CBC:  Recent Labs Lab 02/06/13 0546 02/07/13 0610  WBC 6.8 6.9  HGB 10.8* 10.2*  HCT 32.6* 30.4*  MCV 81.3 80.9  PLT 270 264   Cardiac Enzymes: No results found for this basename: CKTOTAL, CKMB, CKMBINDEX, TROPONINI,  in the last 168 hours BNP (last 3 results) No results found for this basename: PROBNP,  in the last 8760 hours CBG:  Recent Labs Lab 02/05/13 2155 02/06/13 0550 02/06/13 1614 02/06/13 2200 02/07/13 0643  GLUCAP 127* 104* 97 89 141*    Recent Results (from the past 240 hour(s))  URINE CULTURE     Status: None   Collection Time    02/03/13  3:53 AM      Result Value Range Status   Specimen Description URINE, RANDOM   Final   Special Requests NONE   Final   Culture  Setup Time 02/03/2013 11:57   Final   Colony Count >=100,000 COLONIES/ML   Final   Culture ESCHERICHIA COLI   Final   Report Status 02/05/2013  FINAL   Final   Organism ID, Bacteria ESCHERICHIA COLI   Final  SURGICAL PCR SCREEN     Status: None   Collection Time    02/03/13  4:37 AM      Result Value Range Status   MRSA, PCR NEGATIVE  NEGATIVE Final   Staphylococcus aureus NEGATIVE  NEGATIVE Final   Comment:            The Xpert SA Assay (FDA     approved for NASAL specimens     in patients over 44 years of age),     is one component of     a comprehensive surveillance     program.  Test performance has     been validated by [employer] for patients greater     than or equal to 38 year old.     It is not intended     to diagnose infection nor to     guide or monitor treatment.     Studies: Dg Tibia/fibula Right  02/06/2013  *RADIOLOGY REPORT*  Clinical Data: For fixation.  DG C-ARM 61-120 MIN, RIGHT TIBIA AND FIBULA - 2 VIEW  Comparison: Plain films 02/03/2013.  Findings: We are provided with seven fluoroscopic spot views of the right lower leg.  Images demonstrate placement of medial plate and screws for fixation of a distal tibial fracture and a retrograde IM nail for fixation of a fibular fracture.  Position and alignment are markedly improved.  No acute finding.  IMPRESSION: ORIF distal tibial and fibular fractures.   Original Report Authenticated By: Holley Dexter, M.D.    X-ray Tibia Fibula Right Ap And Lateral  02/06/2013  *RADIOLOGY REPORT*  Clinical Data: Postop ORIF.  RIGHT TIBIA AND FIBULA - 2 VIEW  Comparison: Intraoperative imaging 02/06/2013  Findings: Plate and screw fixation across the distal tibial fracture.  There is a wire within the mid and distal fibula.  No hardware complicating feature.  Small displaced bone fragment anteriorly on the lateral view, likely from the distal tibia. Changes of right knee replacement noted.  IMPRESSION: Internal fixation with near anatomic alignment.   Original Report Authenticated By: Charlett Nose, M.D.    Dg C-arm [phone] Min  02/06/2013  *RADIOLOGY REPORT*  Clinical  Data: For fixation.  DG C-ARM 61-120 MIN, RIGHT TIBIA AND FIBULA - 2 VIEW  Comparison: Plain films 02/03/2013.  Findings: We are provided with seven fluoroscopic spot views of the right lower leg.  Images demonstrate placement of medial plate and screws for fixation of a distal tibial fracture and  a retrograde IM nail for fixation of a fibular fracture.  Position and alignment are markedly improved.  No acute finding.  IMPRESSION: ORIF distal tibial and fibular fractures.   Original Report Authenticated By: Holley Dexter, M.D.     Scheduled Meds: . amiodarone  100 mg Oral BID  . cefTRIAXone (ROCEPHIN)  IV  1 g Intravenous Q24H  . cholecalciferol  2,000 Units Oral Daily  . clonazePAM  2 mg Oral TID  . diltiazem  240 mg Oral Daily  . docusate sodium  100 mg Oral BID  . doxycycline  100 mg Oral Q12H  . DULoxetine  60 mg Oral Daily  . enoxaparin (LOVENOX) injection  40 mg Subcutaneous Once  . levothyroxine  250 mcg Oral QAC breakfast  . loratadine  10 mg Oral Daily  . metoprolol tartrate  25 mg Oral Q1200  . morphine   Intravenous Q4H  . multivitamin with minerals  1 tablet Oral Daily  . pantoprazole  40 mg Oral Daily  . polyethylene glycol  17 g Oral BID  . ranolazine  500 mg Oral BID  . rivaroxaban  20 mg Oral Q supper  . rosuvastatin  40 mg Oral q1800  . sulfamethoxazole-trimethoprim  1 tablet Oral Q12H  . tamsulosin  0.4 mg Oral Daily  . vitamin C  250 mg Oral Daily   Continuous Infusions: . sodium chloride 20 mL/hr at 02/06/13 1359    Principal Problem:   Right tibial fracture Active Problems:   Atrial fibrillation   Hypothyroidism   History of recurrent UTIs   Normocytic anemia   Low testosterone, possible hypogonadism   HTN (hypertension)   Psoriasis   Dyslipidemia   GERD (gastroesophageal reflux disease)   Hyperglycemia   Anxiety   Depression   CAD (coronary artery disease)   Chronic gingivitis   Osteomyelitis of right wrist   Obesity, unspecified    Time  spent: 30 min    Sayre Witherington  [facility] Pager [phone]. If 7PM-7AM, please contact night-coverage at [email or web address], password [facility] 02/07/2013, 9:35 AM  LOS: 5 days

## 2013-02-07 NOTE — Progress Notes (Signed)
Patient refused CPAP tonight.  Stated that he did not want to go on CPAP that he was going home tomorrow.

## 2013-02-07 NOTE — Clinical Social Work Psychosocial (Signed)
Clinical Social Work Department  BRIEF PSYCHOSOCIAL ASSESSMENT  Patient: [name] Account Number: [id number]  Admit date: 02/02/13 Clinical Social Worker Sabino Niemann, MSW Date/Time: 02/07/2013 300PM Referred by: Physician Date Referred:  Referred for   SNF Placement   Other Referral:  Interview type: Patient  Other interview type: PSYCHOSOCIAL DATA  Living Status:Husband Admitted from facility:  Level of care:  Primary support name: Ruggles,Michael Primary support relationship to patient: Husband Degree of support available:  Strong and vested  CURRENT CONCERNS  Current Concerns   Post-Acute Placement   Other Concerns:  SOCIAL WORK ASSESSMENT / PLAN  CSW met with pt re: PT recommendation for SNF.   Pt lives with his husband  CSW explained placement process and answered questions.   Pt reports [facility]  as his preference    CSW completed FL2 and sent clinicals to [facility]    Assessment/plan status: Information/Referral to [facility]  Other assessment/ plan:  Information/referral to community resources:  SNF     PATIENT'S/FAMILY'S RESPONSE TO PLAN OF CARE:  Pt  reports he is agreeable to [facility] SNF in order to increase strength and independence with mobility prior to returning home  Pt verbalized understanding of placement process and appreciation for CSW assist.   Sabino Niemann, MSW [phone]

## 2013-02-07 NOTE — Op Note (Signed)
[name], [name]              ACCOUNT NO.:  [id number]  MEDICAL RECORD NO.:  [id number]  LOCATION:  5N06C                        FACILITY:  [facility]  PHYSICIAN:  Doralee Albino. Carola Frost, M.D. DATE OF BIRTH:  [birth year]  DATE OF PROCEDURE:  02/06/2013 DATE OF DISCHARGE:  02/05/2013                              OPERATIVE REPORT   PREOPERATIVE DIAGNOSIS:  Right comminuted tibial shaft fracture and fibula fracture.  POSTOPERATIVE DIAGNOSIS:  Right comminuted tibial shaft fracture and fibula fracture.  PROCEDURES: 1. ORIF of right tibial shaft using Biomet plate 2. IM rodding of the right fibular shaft using a 3-mm Biomet rod.  SURGEON:  Doralee Albino. Carola Frost, M.D.  ASSISTANT:  Mearl Latin, [place]  ANESTHESIA:  General.  COMPLICATIONS:  None.  TOURNIQUET:  None.  ESTIMATED BLOOD LOSS:  Minimal.  DISPOSITION:  To PACU.  CONDITION:  Stable.  BRIEF SUMMARY OF INDICATION FOR PROCEDURE:  [name] is a 67 year old male with previous fractures complicated by nonunion, who sustained a twisting injury to his right leg resulting in fracture of the fibula and tibia with comminution.  The patient has a testosterone deficiency, severe osteopenia and again a history of orthopedic problems with complications.  I did discuss with him the risks and benefits of attempted repair including the possibility of persistent nonunion, infection, need for further surgery, DVT, PE, heart attack, stroke, loss of motion, and many others.  The patient did wish to proceed.  BRIEF SUMMARY OF PROCEDURE:  [name] has received preoperative antibiotics with a slightly early administration of Rocephin with regard to that schedule.  He was also on Bactrim.  A standard prep and drape was performed after induction of general anesthesia.  A 3-cm incision was then made over the medial malleolus and the plate slid subcutaneously along the anteromedial border.  I was careful to watch against displacement of the  fracture and did in fact attempt to improve the reduction where there had been some shortening and impaction at the time of injury that had resulted in some displacement of these fragments.  The attempts at closed improvement in the reduction were helpful, but not successful in completely reducing every fragment. Again, the osteopenia was somewhat severe.  Plate position was confirmed radiographically, both proximally and distally.  Bumps were used to fine tune the reduction.  After provisional fixation, initial standard fixation with standard screws was placed distally and proximally and this was followed with locked screws distally and proximally such that we ended with 4 in the shaft, 2 at the extremes were standard, and then the 2 in the middle were locked, and then distally we had 9 all locked with exchange of the 1 standard for locked completion of placement of the other screws.  We did eliminate all varus from the fracture alignment.  We did place an intramedullary rod to the tip of the fibula, advancing it across the fracture site.  Final films showed appropriate realignment of all the fractures and hardware placement trajectory in length.  Montez Morita, PA-C did assist me throughout procedure and was necessary to control and maintain reduction during provisional and definitive fixation.  All wounds were irrigated thoroughly, closed in standard layered  fashion with 2-0 Vicryl, 3-0 nylon.  Sterile gently compressive dressing.  Posterior and stirrup splint.  The patient was then taken to PACU in stable condition.  PROGNOSIS:  [name] remains at very high risk for nonunion, hardware failure, and all attempts we made to get a bone stimulator immediately to help facilitate repair appeared continued to address the metabolic and endocrine issues related to his poor bone quality, and he will be on DVT prophylaxis with Lovenox.  To have unrestricted range of motion as soon as soft  tissues allow, but will be nonweightbearing for the next 8 weeks with graduated bearing thereafter.     Doralee Albino. Carola Frost, M.D.     [facility]/MEDQ  D:  02/06/2013  T:  02/07/2013  Job:  [id number]

## 2013-02-07 NOTE — Progress Notes (Signed)
[facility] Progress Note     1 Day Post-Op  Subjective   Pt doing well this am Pain controlled  Tolerating diet No acute complaints Denies CP, No SOB No N/V/D  Pt indicated that he and his partner are having a difficult time due to all the injuries and care the pt has required over the last several months.  Pt really would like to go to a nursing home after this hospital stay   Objective  BP 117/49  Pulse 77  Temp(Src) 97.4 F (36.3 C) (Oral)  Resp 16  Ht 6\' 4"  (1.93 m)  Wt 144.244 kg (318 lb)  BMI 38.72 kg/m2  SpO2 98%  Intake/Output     04/29 0701 - 04/30 0700 04/30 0701 - 05/01 0700   P.O. 480    I.V. (mL/kg) 3140 (21.8)    Other 100    IV Piggyback 250    Total Intake(mL/kg) 3970 (27.5)    Urine (mL/kg/hr) 3650 (1.1)    Stool     Total Output 3650     Net +320            Labs  Results for BRENAN, MODESTO (MRN [id number]) as of 02/07/2013 08:27  Ref. Range 02/07/2013 06:10  Sodium Latest Range: 135-145 mEq/L 134 (L)  Potassium Latest Range: 3.5-5.1 mEq/L 4.2  Chloride Latest Range: 96-112 mEq/L 99  CO2 Latest Range: 19-32 mEq/L 29  BUN Latest Range: 6-23 mg/dL 15  Creatinine Latest Range: 0.50-1.35 mg/dL 6.57  Calcium Latest Range: 8.4-10.5 mg/dL 8.4  GFR calc non Af Amer Latest Range: >90 mL/min 87 (L)  GFR calc Af Amer Latest Range: >90 mL/min >90  Glucose Latest Range: 70-99 mg/dL 846 (H)  Alkaline Phosphatase Latest Range: 39-117 U/L 154 (H)  Albumin Latest Range: 3.5-5.2 g/dL 2.5 (L)  AST Latest Range: 0-37 U/L 12  ALT Latest Range: 0-53 U/L 12  Total Protein Latest Range: 6.0-8.3 g/dL 5.6 (L)  Total Bilirubin Latest Range: 0.3-1.2 mg/dL 0.4  WBC Latest Range: 4.0-10.5 K/uL 6.9  RBC Latest Range: 4.22-5.81 MIL/uL 3.76 (L)  Hemoglobin Latest Range: 13.0-17.0 g/dL 96.2 (L)  HCT Latest Range: 39.0-52.0 % 30.4 (L)  MCV Latest Range: 78.0-100.0 fL 80.9  MCH Latest Range: 26.0-34.0 pg 27.1  MCHC Latest Range: 30.0-36.0 g/dL 95.2   RDW Latest Range: 11.5-15.5 % 16.4 (H)  Platelets Latest Range: 150-400 K/uL 264   Results for CHAIM, GATLEY (MRN [id number]) as of 02/07/2013 08:27  Ref. Range 02/06/2013 05:46  Vit D, 25-Hydroxy Latest Range: 30-89 ng/mL 35   Results for CAROLYN, MANISCALCO (MRN [id number]) as of 02/07/2013 08:27  Ref. Range 02/06/2013 05:46  Hemoglobin A1C Latest Range: <5.7 % 5.8 (H)   Results for TALLON, GERTZ (MRN [id number]) as of 02/07/2013 08:27  Ref. Range 02/06/2013 05:46  TSH Latest Range: 0.350-4.500 uIU/mL 5.383 (H)    Exam  Gen: doing ok, resting comfortably, NAD Lungs: clear anteriorly  Cardiac: s1 and s2 Abd: + BS, NT,ND Ext:            Right Lower Extremity    Splint fitting well  Ext warm  EHL, FHL motor intact  No pain with passive stretch  Swelling stable          Assessment and Plan  1 Day Post-Op  67 y/o male s/p low energy ground level fall with comminuted R distal 1/3 tibial shaft fracture  1. Ground level fall 2. Comminuted closed distal 1/3 tibial shaft and fibular  shaft fracture s/p ORIF POD 1  NWB x 8 weeks  ROM of knee as tolerated  Hip ROM- total hip precautions  PT/OT   Pt can WB thru R elbow to use platform walker   WBAT L leg  3. Medical issues  Per medical team   4. ABL anemia:  Monitor  Check CBC in AM 5. Pain management:  Oral meds  D/c IV  6. DVT/PE prophylaxis/A-fib    lovenox  Resume xarelto 7. UTI  Continue rocephin  Will convert to oral agent at discharge for 7 days  D/c foley today  8. ID:   Rocephin as above for UTI  Doxycycline for gingival disease  Bactrim for osteo R wrist  9. Metabolic Bone Disease:    Vitamin D level improved on this admission at 35, previously was round 15. Continue with supplementation  Pt has only had 2 doses of testosterone thus far.  Previous Testosterone level was 26.00, will recheck to see if any response to therapy   Pts bone is extremely soft, clinically has osteoporosis  Discussed  additional agents with the pt in terms of optimizing bone density, do believe pt is a candidate for Forteo therapy as it is also indicated for osteoporosis in testosterone deficient males   Additionally we will also get the pt a bone stimulator as an adjuvant for fx healing to the R leg  10. Activity:  OOB as tolerated  NWB R leg  11. FEN/Foley/Lines:  KVO IVF  D/C foley  12.Ex-fix/Splint care:  Do not get splint wet  Maintain splint at all times  13. Impediments to fracture healing:  Testosterone deficiency  Osteoporosis  Low vitamin D   Immobility and deconditioning  14. Dispo:  Therapies  SNF when bed available    Mearl Latin, PA-C [facility] [phone] (P) 02/07/2013 8:23 AM

## 2013-02-08 ENCOUNTER — Inpatient Hospital Stay (HOSPITAL_COMMUNITY): Payer: Medicare Other

## 2013-02-08 ENCOUNTER — Other Ambulatory Visit: Payer: Self-pay | Admitting: Geriatric Medicine

## 2013-02-08 ENCOUNTER — Encounter (HOSPITAL_COMMUNITY): Payer: Self-pay | Admitting: General Practice

## 2013-02-08 DIAGNOSIS — M545 Low back pain: Secondary | ICD-10-CM | POA: Diagnosis present

## 2013-02-08 DIAGNOSIS — R5381 Other malaise: Secondary | ICD-10-CM | POA: Diagnosis present

## 2013-02-08 DIAGNOSIS — W19XXXA Unspecified fall, initial encounter: Secondary | ICD-10-CM

## 2013-02-08 DIAGNOSIS — S32010A Wedge compression fracture of first lumbar vertebra, initial encounter for closed fracture: Secondary | ICD-10-CM

## 2013-02-08 LAB — CBC
HCT: 30.1 % — ABNORMAL LOW (ref 39.0–52.0)
Hemoglobin: 9.9 g/dL — ABNORMAL LOW (ref 13.0–17.0)
MCH: 26.8 pg (ref 26.0–34.0)
MCHC: 32.9 g/dL (ref 30.0–36.0)
MCV: 81.4 fL (ref 78.0–100.0)

## 2013-02-08 LAB — TESTOSTERONE, FREE: Testosterone, Free: 70.2 pg/mL (ref 47.0–244.0)

## 2013-02-08 LAB — COMPREHENSIVE METABOLIC PANEL
Alkaline Phosphatase: 147 U/L — ABNORMAL HIGH (ref 39–117)
BUN: 16 mg/dL (ref 6–23)
Creatinine, Ser: 0.88 mg/dL (ref 0.50–1.35)
GFR calc Af Amer: >90 mL/min (ref >90)
Glucose, Bld: 96 mg/dL (ref 70–99)
Potassium: 4.2 mEq/L (ref 3.5–5.1)
Total Protein: 5.7 g/dL — ABNORMAL LOW (ref 6.0–8.3)

## 2013-02-08 LAB — SEX HORMONE BINDING GLOBULIN: Sex Hormone Binding: 22 nmol/L (ref 13–71)

## 2013-02-08 LAB — TESTOSTERONE, % FREE: Testosterone-% Free: 2.4 % — ABNORMAL HIGH (ref 1.6–2.9)

## 2013-02-08 MED ORDER — LEVOTHYROXINE SODIUM 125 MCG PO TABS: 250.0000 ug | ORAL_TABLET | Freq: Every day | ORAL | Status: DC

## 2013-02-08 MED ORDER — METHOCARBAMOL 500 MG PO TABS: 500.0000 mg | ORAL_TABLET | Freq: Four times a day (QID) | ORAL | Status: DC | PRN

## 2013-02-08 MED ORDER — DOXYCYCLINE HYCLATE 100 MG PO TABS
100.0000 mg | ORAL_TABLET | Freq: Every day | ORAL | Status: DC
  Administered 2013-02-09: 100 mg via ORAL
  Filled 2013-02-08: qty 1

## 2013-02-08 MED ORDER — NITROFURANTOIN MONOHYD MACRO 100 MG PO CAPS: 100.0000 mg | ORAL_CAPSULE | Freq: Two times a day (BID) | ORAL | Status: DC

## 2013-02-08 MED ORDER — CLONAZEPAM 2 MG PO TABS: 2.0000 mg | ORAL_TABLET | Freq: Three times a day (TID) | ORAL | Status: DC

## 2013-02-08 MED ORDER — DSS 100 MG PO CAPS: 100.0000 mg | ORAL_CAPSULE | Freq: Two times a day (BID) | ORAL | Status: DC | PRN

## 2013-02-08 MED ORDER — OXYCODONE-ACETAMINOPHEN 5-325 MG PO TABS: 1.0000 | ORAL_TABLET | Freq: Four times a day (QID) | ORAL | Status: DC | PRN

## 2013-02-08 MED ORDER — NITROFURANTOIN MONOHYD MACRO 100 MG PO CAPS
100.0000 mg | ORAL_CAPSULE | Freq: Two times a day (BID) | ORAL | Status: DC
  Administered 2013-02-09: 100 mg via ORAL
  Filled 2013-02-08 (×2): qty 1

## 2013-02-08 MED ORDER — OXYCODONE-ACETAMINOPHEN 5-325 MG PO TABS
1.0000 | ORAL_TABLET | Freq: Four times a day (QID) | ORAL | Status: DC | PRN
  Administered 2013-02-08 – 2013-02-09 (×3): 2 via ORAL
  Filled 2013-02-08 (×3): qty 2

## 2013-02-08 MED ORDER — OXYCODONE HCL 5 MG PO TABS: 5.0000 mg | ORAL_TABLET | ORAL | Status: DC | PRN

## 2013-02-08 NOTE — Progress Notes (Signed)
PT Cancellation Note  Patient Details Name: [name] MRN: [id number] DOB: 30-Sep-1946   Cancelled Treatment:    Reason Eval/Treat Not Completed: Medical issues which prohibited therapy;Patient at procedure or test/unavailable. Attempted to see pt x 2; first attempt pt did not have back brace required; 2nd attempt pt was off floor for MRI. Will re-attempt tomorrow.    Donnamarie Poag [place], [place] [phone] 02/08/2013, 3:21 PM

## 2013-02-08 NOTE — Progress Notes (Signed)
[facility] PROGRESS NOTE  [name] ZOX:[id number] DOB: 02-Nov-1945 DOA: 02/02/2013 PCP: Lupita Raider, MD  Assessment/Plan  Comminuted impacted mildly angulated fractures of the distal third of the tibial and fibular diaphyses in setting of recurrent fractures  - POD 1 s/p ORIF tibia fracture with IM rod fibula - Vitamin d level normal - Outpatient DEXA    CAD, stable. Continue beta blocker and statin perioperatively.  - Continue ranolazine  - xarelto Atrial fibrillation, stable.  - Goal HR < 100bpm  - Continue dilt, beta blocker, and amiodarone  - xarelto restarted   HTN, blood pressures stable  HLD, stable, continue statin   Dyspnea on exertion, likely due to deconditioning in the setting of obesity. May have some underlying COPD due to cigarette use history, however, lungs are hypoinflated on CXR  - Albuterol prn  - Incentive spirometry  - Encourage OOB as much as tolerated   OSA, stable. Continue CPAP at night   Hyperglycemia and borderline diabetes.  A1c 5.8  - d/c CBG and insulin  Hypothyroidism, TSH 5.383, still elevated from goal -  Increase synthroid to daily and repeat TSH in 4-6 weeks  E. Coli UTI, asymptomatic. Agree with ceftriaxone day 4. Consider nitrofurantoin at discharge for 3 more days.  Gingival disease. Stable. Continue doxycycline  Right hand osteomyelitis with hardware in place. Continue bactrim.  Psoriasis. Hold Enbrel  Constipation, chronic problem and likely acutely worse due to narcotics.  Continue bowel regimen   Anemia of chronic disease, baseline hgb near 10.  hgb at baseline.  Anemia w/u per PCP BPH:  Continue flomax Back pain: per ortho.   Discharge when ok by ortho.   Procedures:  Right ORIF tibia and IM rod fibula 4/29  Antibiotics:  Ceftriaxone 4/28 >>   Doxycycline ongoing through discharge  Bactrim ongoing through discharge  HPI/Subjective: Feeling well. Pain controlled.  No pain. No  SOB  Objective: Filed Vitals:   02/07/13 1538 02/07/13 2212 02/08/13 0623 02/08/13 0800  BP:  130/56 135/62   Pulse:  72 75   Temp:  98.7 F (37.1 C) 98.9 F (37.2 C)   TempSrc:      Resp: 18 18 18 18   Height:      Weight:      SpO2:  99% 96%     Intake/Output Summary (Last 24 hours) at 02/08/13 1056 Last data filed at 02/08/13 0831  Gross per 24 hour  Intake    240 ml  Output    400 ml  Net   -160 ml   Filed Weights   02/03/13 0220  Weight: 144.244 kg (318 lb)    Exam:   General:  , No acute distress  Cardiovascular:  RRR, nl S1, S2 no mrg, 2+ pulses,   Respiratory:  CTAB, no increased WOB  Abdomen:   NABS, soft, NT/ND  MSK:  Right LE with dressing.   Data Reviewed: Basic Metabolic Panel:  Recent Labs Lab 02/03/13 0401 02/06/13 0546 02/07/13 0610 02/08/13 0431  NA 137 135 134* 134*  K 3.9 4.2 4.2 4.2  CL 100 100 99 99  CO2 27 28 29 29   GLUCOSE 158* 112* 137* 96  BUN 17 19 15 16   CREATININE 1.02 0.93 0.89 0.88  CALCIUM 8.7 8.5 8.4 8.7   Liver Function Tests:  Recent Labs Lab 02/07/13 0610 02/08/13 0431  AST 12 11  ALT 12 10  ALKPHOS 154* 147*  BILITOT 0.4 0.4  PROT 5.6* 5.7*  ALBUMIN 2.5* 2.5*  CBC:  Recent Labs Lab 02/06/13 0546 02/07/13 0610 02/08/13 0431  WBC 6.8 6.9 5.8  HGB 10.8* 10.2* 9.9*  HCT 32.6* 30.4* 30.1*  MCV 81.3 80.9 81.4  PLT 270 264 257   Cardiac Enzymes: No results found for this basename: CKTOTAL, CKMB, CKMBINDEX, TROPONINI,  in the last 168 hours BNP (last 3 results) No results found for this basename: PROBNP,  in the last 8760 hours CBG:  Recent Labs Lab 02/06/13 1614 02/06/13 2200 02/07/13 0643 02/07/13 1123 02/07/13 1614  GLUCAP 97 89 141* 103* 122*    Recent Results (from the past 240 hour(s))  URINE CULTURE     Status: None   Collection Time    02/03/13  3:53 AM      Result Value Range Status   Specimen Description URINE, RANDOM   Final   Special Requests NONE   Final   Culture   Setup Time 02/03/2013 11:57   Final   Colony Count >=100,000 COLONIES/ML   Final   Culture ESCHERICHIA COLI   Final   Report Status 02/05/2013 FINAL   Final   Organism ID, Bacteria ESCHERICHIA COLI   Final  SURGICAL PCR SCREEN     Status: None   Collection Time    02/03/13  4:37 AM      Result Value Range Status   MRSA, PCR NEGATIVE  NEGATIVE Final   Staphylococcus aureus NEGATIVE  NEGATIVE Final   Comment:            The Xpert SA Assay (FDA     approved for NASAL specimens     in patients over 40 years of age),     is one component of     a comprehensive surveillance     program.  Test performance has     been validated by [employer] for patients greater     than or equal to 44 year old.     It is not intended     to diagnose infection nor to     guide or monitor treatment.     Studies: X-ray Tibia Fibula Right Ap And Lateral  02/06/2013  *RADIOLOGY REPORT*  Clinical Data: Postop ORIF.  RIGHT TIBIA AND FIBULA - 2 VIEW  Comparison: Intraoperative imaging 02/06/2013  Findings: Plate and screw fixation across the distal tibial fracture.  There is a wire within the mid and distal fibula.  No hardware complicating feature.  Small displaced bone fragment anteriorly on the lateral view, likely from the distal tibia. Changes of right knee replacement noted.  IMPRESSION: Internal fixation with near anatomic alignment.   Original Report Authenticated By: Charlett Nose, M.D.    Dg Ankle Complete Right  02/07/2013  *RADIOLOGY REPORT*  Clinical Data: Status post internal fixation of right ankle fracture.  RIGHT ANKLE - COMPLETE 3+ VIEW  Comparison: February 02, 2013.  Findings: Status post surgical internal fixation of the comminuted distal tibial fracture with improved alignment of fracture components.  Intramedullary rod fixation of comminuted distal fibular fracture is also noted. The joint has been casted and immobilized.  IMPRESSION: Status post internal fixation of distal tibial and fibular  fractures.   Original Report Authenticated By: Lupita Raider.,  M.D.     Scheduled Meds: . amiodarone  100 mg Oral BID  . cefTRIAXone (ROCEPHIN)  IV  1 g Intravenous Q24H  . cholecalciferol  2,000 Units Oral Daily  . clonazePAM  2 mg Oral TID  . diltiazem  240 mg Oral Daily  . docusate sodium  100 mg Oral BID  . doxycycline  100 mg Oral Q12H  . DULoxetine  60 mg Oral Daily  . levothyroxine  250 mcg Oral QAC breakfast  . loratadine  10 mg Oral Daily  . metoprolol tartrate  25 mg Oral Q1200  . multivitamin with minerals  1 tablet Oral Daily  . [START ON 02/09/2013] nitrofurantoin (macrocrystal-monohydrate)  100 mg Oral Q12H  . pantoprazole  40 mg Oral Daily  . polyethylene glycol  17 g Oral BID  . ranolazine  500 mg Oral BID  . rivaroxaban  20 mg Oral Q supper  . rosuvastatin  40 mg Oral q1800  . sulfamethoxazole-trimethoprim  1 tablet Oral Q12H  . tamsulosin  0.4 mg Oral Daily  . vitamin C  250 mg Oral Daily   Continuous Infusions:    Principal Problem:   Right tibial fracture Active Problems:   Atrial fibrillation   Hypothyroidism   History of recurrent UTIs   Normocytic anemia   Low testosterone, possible hypogonadism   HTN (hypertension)   Dyslipidemia   GERD (gastroesophageal reflux disease)   Hyperglycemia   Anxiety   Depression   CAD (coronary artery disease)   DJD (degenerative joint disease)   Chronic gingivitis   Osteomyelitis of right wrist   Obesity, unspecified   Fall   Physical deconditioning   Low back pain radiating to both legs    Time spent:25 min    Caison Hearn  [facility] Pager [phone]. If 7PM-7AM, please contact night-coverage at [email or web address], password [email or web address] 02/08/2013, 10:56 AM  LOS: 6 days

## 2013-02-08 NOTE — Progress Notes (Signed)
Or[facility]opaedic Trauma Service Progress Note   2 Days Post-Op  Subjective   Pt doing ok However reports low back pain and B LEx shooting pains that have been present since the end of Feb.  Pt states that he fell from a standing height on the day he got home from the nursing home after his last hospital admission and landed right on his butt.  Since that time he has noted intermittent pain in his low back with occasional radiation to B LE that stops at his thighs. Gets relief when lying in a recumbent position.  Not related to any distance walked. No issues with bowel or bladder function. No numbness or tingling to buttock.  Has noted some weakness B as well  Pts pain is under control Ready to go to snf  No CP, no SOB No abd pain No N/V No h/a   Voiding on own and eating well  Objective  BP 135/62  Pulse 75  Temp(Src) 98.9 F (37.2 C) (Oral)  Resp 18  Ht 6\' 4"  (1.93 m)  Wt 144.244 kg (318 lb)  BMI 38.72 kg/m2  SpO2 96%  Patient Vitals for the past 24 hrs:  BP Temp Pulse Resp SpO2  02/08/13 0623 135/62 mmHg 98.9 F (37.2 C) 75 18 96 %  02/07/13 2212 130/56 mmHg 98.7 F (37.1 C) 72 18 99 %  02/07/13 1538 - - - 18 -  02/07/13 1300 126/52 mmHg 98.5 F (36.9 C) 69 18 91 %  02/07/13 1200 - - - 18 -    Intake/Output     04/30 0701 - 05/01 0700 05/01 0701 - 05/02 0700   P.O.  240   I.V. (mL/kg)     Other     IV Piggyback     Total Intake(mL/kg)  240 (1.7)   Urine (mL/kg/hr) 100 (0) 300 (0.9)   Total Output 100 300   Net -100 -60          Labs Results for Joseph, Lane (MRN [id number]) as of 02/08/2013 09:31  Ref. Range 02/08/2013 04:31  Sodium Latest Range: 135-145 mEq/L 134 (L)  Potassium Latest Range: 3.5-5.1 mEq/L 4.2  Chloride Latest Range: 96-112 mEq/L 99  CO2 Latest Range: 19-32 mEq/L 29  BUN Latest Range: 6-23 mg/dL 16  Creatinine Latest Range: 0.50-1.35 mg/dL 7.82  Calcium Latest Range: 8.4-10.5 mg/dL 8.7  GFR calc non Af Amer Latest Range: >90 mL/min 87  (L)  GFR calc Af Amer Latest Range: >90 mL/min >90  Glucose Latest Range: 70-99 mg/dL 96  Alkaline Phosphatase Latest Range: 39-117 U/L 147 (H)  Albumin Latest Range: 3.5-5.2 g/dL 2.5 (L)  AST Latest Range: 0-37 U/L 11  ALT Latest Range: 0-53 U/L 10  Total Protein Latest Range: 6.0-8.3 g/dL 5.7 (L)  Total Bilirubin Latest Range: 0.3-1.2 mg/dL 0.4  WBC Latest Range: 4.0-10.5 K/uL 5.8  RBC Latest Range: 4.22-5.81 MIL/uL 3.70 (L)  Hemoglobin Latest Range: 13.0-17.0 g/dL 9.9 (L)  HCT Latest Range: 39.0-52.0 % 30.1 (L)  MCV Latest Range: 78.0-100.0 fL 81.4  MCH Latest Range: 26.0-34.0 pg 26.8  MCHC Latest Range: 30.0-36.0 g/dL 95.6  RDW Latest Range: 11.5-15.5 % 16.5 (H)  Platelets Latest Range: 150-400 K/uL 257   Results for Joseph, Lane (MRN [id number]) as of 02/08/2013 09:31  Ref. Range 02/07/2013 09:00  Testosterone Latest Range: 300-890 ng/dL 469 (L)    Exam  Gen: Awake and alert, lying comfortably in bed  Lungs: clear Cardiac: s1 and s2 Abd: + BS  Ext:            Right Lower Extremity    Splint c/d/i  Distal motor and sensory functions intact  EHL, FHL, lesser toe motor intact  DPN, SPN, TN sensation intact  Ext is warm  + DP pulse  Compartments soft and NT       Left Lower Extremity  Motor and sensory functions and symmetric, weak on L leg as well   No acute findings noted  Can perform SLR but limited     Assessment and Plan  2 Days Post-Op  67 y/o male s/p low energy ground level fall with comminuted R distal 1/3 tibial shaft fracture  1. Ground level fall 2. Comminuted closed distal 1/3 tibial shaft and fibular shaft fracture s/p ORIF POD 2             NWB x 8 weeks             ROM of knee as tolerated             Hip ROM- total hip precautions             PT/OT                         Pt can WB thru R elbow to use platform walker                         WBAT L leg 3. Low back pain x 2 months  Pt gives history of low back pain for about 2 months with  associated weakness, tingling and shooting pain.  Again this is occasional  Clinical picture is complicated given profound deconditioning as his mobility has been severely limited since 05/2012  Weakness may be related to muscle atrophy and disuse but other components of the history indicate possible back pathology such as vertebral compression fx and possibly central stenosis as B LEx are affected  No sensory disturbances are noted on exam and L leg demonstrates satisfactory strength but again picture complicated by deconditioning   Pt also has very poor bone and his fall from standing height could have definitely resulted in a compression fx  Will check complete lumbar and thoracic series   May consider MRI as well    3. Medical issues             Per medical team   4. ABL anemia:             Monitor             stable 5. Pain management:             Oral meds               6. DVT/PE prophylaxis/A-fib             lovenox d/c'd             xarelto 7. UTI             has been covered with rocephin, today is day #3             Will convert to oral agent at discharge for 7 days- nitrofurantoin (large MIC but E. coli sensitive )               8. ID:               Rocephin as above for  UTI, change to nitrofurantoin at d/c              Doxycycline for gingival disease             Bactrim for osteo R wrist  9. Metabolic Bone Disease:                          Vitamin D level improved on this admission at 35, previously was round 15. Continue with supplementation             Pt has only had 2 doses of testosterone thus far.  Previous Testosterone level was 26.00, repeat testosterone level is 289. Excellent response thus far.              Pts bone is extremely soft, clinically has osteoporosis             Discussed additional agents with the pt in terms of optimizing bone density, do believe pt is a candidate for Forteo therapy as it is also indicated for osteoporosis in testosterone  deficient males  Continue with Vitamin D supplementation as well              Additionally we will also get the pt a bone stimulator as an adjuvant for fx healing to the R leg  10. Activity:             OOB as tolerated             NWB R leg  11. FEN/Foley/Lines:             NSL IV             12.Ex-fix/Splint care:             Do not get splint wet             Maintain splint at all times  13. Impediments to fracture healing:             Testosterone deficiency             Osteoporosis             Low vitamin D               Immobility and deconditioning, balance issues   14. Dispo:             Therapies             hopeful for snf today  xrays throacic and lumbar spine   Mearl Latin, PA-C [facility] [phone] (P) 02/08/2013 9:26 AM     [facility] Addendum   Followup x-rays of lumbar and thoracic spine show severe collapse and compression of the L1 vertebra. We have discussed [name] with Dr. Shon Baton, spine surgery for [facility] orthopedics. He is more than happy to see the patient. He does recommend placing the patient in a Jewett hyperextension brace. We'll obtain a CT scan and MRI for additional workup. Patient is still stable for discharge to skilled nursing facility today and is to wear his brace at all times especially when mobilizing. This can be removed for hygiene but then placed back on.  We will also arrange for the patient to followup with an endocrinologist as well. Again I do feel that his metabolic bone disease is directly related to his testosterone deficiency. However patient likely to to evaluate the etiology of his testosterone deficiency. I did consider ordering LH, FSH  and Sex hormone binding globulin here at the hospital however I am unclear as to the effect that supplemental testosterone will have on these. I prefer to defer this to the endocrinologist, we will refer to [facility]  end[facility]inology as an  outpatient. Patient will followup with Dr. Shon Baton in 2 weeks as well for reevaluation of his lumbar compression fracture.  Mearl Latin, PA-C [facility] [phone] (P) 02/08/2013 1:02 PM

## 2013-02-08 NOTE — Progress Notes (Signed)
Orthopedic Tech Progress Note Patient Details:  [name] 08/01/1946 [id number] [employer] called for brace order.  Patient ID: [name], male   DOB: 1946-04-03, 67 y.o.   MRN: [id number]   [name] 02/08/2013, 12:21 PM

## 2013-02-08 NOTE — Progress Notes (Signed)
Orthopedic Tech Progress Note Patient Details:  [name] [birth year] [id number] Brace completed by [employer] vendor. Patient ID: [name], male   DOB: 1946/07/31, 67 y.o.   MRN: [id number]   Jennye Moccasin 02/08/2013, 3:16 PM

## 2013-02-08 NOTE — Discharge Summary (Signed)
Orthopaedic Trauma Service (OTS)  Patient ID: TAMARION HAYMOND MRN: [id number] DOB/AGE: 1946/03/05 67 y.o.  Admit date: 02/02/2013 Discharge date: 02/08/2013  Admission Diagnoses: Ground level fall  Closed R distal 1/3 tibial shaft fracture Severe deconditioning  A-fib Testosterone deficiency, currently under tx Hypothyroidism Recurrent UTI HTN Dyslipidemia Obesity Anxiety Depression DJD Low back pain  CAD Chronic gingivitis  Osteomyelitis R wrist  Hyperglycemia  Normocytic anemia  GERD   Discharge Diagnoses:  Principal Problem:   Right tibial fracture Active Problems:   Atrial fibrillation   Hypothyroidism   History of recurrent UTIs   Normocytic anemia   Low testosterone, possible hypogonadism   HTN (hypertension)   Dyslipidemia   GERD (gastroesophageal reflux disease)   Hyperglycemia   Anxiety   Depression   CAD (coronary artery disease)   DJD (degenerative joint disease)   Chronic gingivitis   Osteomyelitis of right wrist   Obesity, unspecified   Fall   Physical deconditioning   Low back pain radiating to both legs subacute L1 compression fx   Procedures Performed: 4//29/2014- Dr. Carola Frost   1. ORIF of right tibial shaft using Biomet plate   2. IM rodding of the right fibular shaft using a 3-mm Biomet rod.   Discharged Condition: stable  Hospital Course:   Patient is a very pleasant 67 year old white male with numerous medical comorbidities who is been dealing with multiple orthopedic issues, as well as metabolic bone disease likely related to his testosterone deficiency, since August of 2013 including infected him right distal radius fracture which is since been fused as well as a catastrophic failure of his right intertrochanteric femur fracture which has been repaired by a total hip arthroplasty who has had a protracted course at the skilled nursing facilities as well as at home complicated by slow mobilization who presented to [facility]  hospital on 02/02/2013 after sustaining a ground-level fall resulting in injury to his right distal tibia. Again patient was brought to [facility] long hospital for evaluation where a diagnosis of a distal third tibial shaft fracture was made. Patient was seen and evaluated by Dr. Ranell Patrick of orthopedics. Patient was admitted to the hospital.  given the complexity of the injury the orthopedic trauma service was consulted for definitive management. Patient was transferred to [place] to expedite his care.  On admission the patient was noted to have a UTI which the culture grew out Escherichia coli. Cultures grew out same organism from his previous hospitalization. It is an extended spectrum beta lactamase organism and as such was started on Rocephin for treatment of his UTI. Also on transferred to [place] we did have the hospital as a consult on the patient to facilitate management of his medical issues as patient does have A. fib, CAD, COPD, GERD as well as other medical issues. We felt that close medical observation by the hospitalist is necessary to optimize patient's care.  Patient was taken to the operating room on 02/06/2013 where he will underwent open reduction internal fixation of his right distal tibia fracture. We are unable to use an intramedullary nail as the patient had a total knee replacement proximally. Tolerated the procedure very well after surgery was transferred to the PACU for recovery from anesthesia and was transferred back to the orthopedic floor for continued observation and pain control. Patient was started on therapies on postoperative day #1 and was doing well.  Patient's Foley was discontinued on postoperative day #1. He was restarted on xarelto  for his A. fib as well as DVT prophylaxis. Patient was slow to mobilize but was doing fairly well. The patient's levothyroxine was also adjusted on postoperative day #1 by the hospitalist given his elevated TSH levels. On postoperative  day #2 patient was in good spirits however on evaluation he made mention of a fall that he sustained about 2 months ago after he returned home from the skilled nursing facility. Patient states that he fell from a standing height landing on his butt and since that time has had continued back pain with associated lower extremity pain and weakness. This weakness and pain has been intermittent in nature and is by no means constant. However the patient is felt that he is regressed in terms of his therapies from the standpoint of his lower extremities. Again patient reports pain as intermittent and shooting in nature. His pain was relieved with lying recumbent on or lying completely flat. It is made worse with weightbearing activities. However he does not always have a pain with weightbearing activities. Given all this clinical information would be to proceed with x-rays of his lumbar spine as well as thoracic spine. Plain films of his lumbar spine were notable severe collapse and compression of the L1 vertebra. In lieu of this new clinical finding we did discuss the case with Dr. Shon Baton a spine surgeon who will see the patient in consultation and address his vertebral fracture. Patient will be placed in a Jewett brace the to provide some support and allow him to continue mobilize him. At the time of dictation a CT scan and an MRI is pending.  Mr. Hedberg did not have any additional issues during his hospital stay. We did recheck his testosterone level as he had several doses of testosterone supplementation and admitted a dramatic improvement in his testosterone level     Consults: Internal Medicine   Pt was on contact precautions for Extended spectrum beta lactamase producing bacteria (E. coli UTI)   Significant Diagnostic Studies: labs:  Results for OLUWATOMIWA, KINYON (MRN [id number]) as of 02/08/2013 09:31  Ref. Range 02/08/2013 04:31  Sodium Latest Range: 135-145 mEq/L 134 (L)  Potassium Latest Range: 3.5-5.1  mEq/L 4.2  Chloride Latest Range: 96-112 mEq/L 99  CO2 Latest Range: 19-32 mEq/L 29  BUN Latest Range: 6-23 mg/dL 16  Creatinine Latest Range: 0.50-1.35 mg/dL 4.09  Calcium Latest Range: 8.4-10.5 mg/dL 8.7  GFR calc non Af Amer Latest Range: >90 mL/min 87 (L)  GFR calc Af Amer Latest Range: >90 mL/min >90  Glucose Latest Range: 70-99 mg/dL 96  Alkaline Phosphatase Latest Range: 39-117 U/L 147 (H)  Albumin Latest Range: 3.5-5.2 g/dL 2.5 (L)  AST Latest Range: 0-37 U/L 11  ALT Latest Range: 0-53 U/L 10  Total Protein Latest Range: 6.0-8.3 g/dL 5.7 (L)  Total Bilirubin Latest Range: 0.3-1.2 mg/dL 0.4  WBC Latest Range: 4.0-10.5 K/uL 5.8  RBC Latest Range: 4.22-5.81 MIL/uL 3.70 (L)  Hemoglobin Latest Range: 13.0-17.0 g/dL 9.9 (L)  HCT Latest Range: 39.0-52.0 % 30.1 (L)  MCV Latest Range: 78.0-100.0 fL 81.4  MCH Latest Range: 26.0-34.0 pg 26.8  MCHC Latest Range: 30.0-36.0 g/dL 81.1  RDW Latest Range: 11.5-15.5 % 16.5 (H)  Platelets Latest Range: 150-400 K/uL 257   Results for HERSHY, FLENNER (MRN [id number]) as of 02/08/2013 09:31  Ref. Range 02/06/2013 05:46  Hemoglobin A1C Latest Range: <5.7 % 5.8 (H)    Results for COHAN, STIPES (MRN [id number]) as of 02/08/2013 09:31  Ref. Range 02/07/2013 09:00  Testosterone Latest Range: 300-890 ng/dL 562 (L)     Results for DONTARIOUS, SCHAUM (MRN [id number]) as of 02/08/2013 09:31  Ref. Range 02/06/2013 05:46 02/07/2013 06:10  TSH Latest Range: 0.350-4.500 uIU/mL 5.383 (H)    Results for KARELL, TUKES (MRN [id number]) as of 02/08/2013 09:31  Ref. Range 02/06/2013 05:46  Vit D, 25-Hydroxy Latest Range: 30-89 ng/mL 35   Results for JARIUS, DIEUDONNE (MRN [id number]) as of 02/08/2013 09:31  Ref. Range 02/03/2013 03:53  Color, Urine Latest Range: YELLOW  AMBER (A)  APPearance Latest Range: CLEAR  CLOUDY (A)  Specific Gravity, Urine Latest Range: 1.005-1.030  1.023  pH Latest Range: 5.0-8.0  6.0  Glucose Latest Range: NEGATIVE mg/dL NEGATIVE   Bilirubin Urine Latest Range: NEGATIVE  SMALL (A)  Ketones, ur Latest Range: NEGATIVE mg/dL NEGATIVE  Protein Latest Range: NEGATIVE mg/dL NEGATIVE  Urobilinogen, UA Latest Range: 0.0-1.0 mg/dL 1.0  Nitrite Latest Range: NEGATIVE  NEGATIVE  Leukocytes, UA Latest Range: NEGATIVE  MODERATE (A)  Hgb urine dipstick Latest Range: NEGATIVE  LARGE (A)  WBC, UA Latest Range: <3 WBC/hpf 11-20  RBC / HPF Latest Range: <3 RBC/hpf 21-50  Bacteria, UA Latest Range: RARE  MANY (A)   Urine culture  Status: Final result     Visible to patient: This result is viewable by the patient in MyChart.     Next appt: 03/20/2013 at 10:00AM with Cliffton Asters, MD              Value   Specimen Description URINE, RANDOM     Special Requests NONE     Culture  Setup Time 02/03/2013 11:57     Colony Count >=100,000 COLONIES/ML     Culture ESCHERICHIA COLI     Report Status 02/05/2013 FINAL     Organism ID, Bacteria ESCHERICHIA COLI     Resulting Agency [facility]      Culture & Susceptibility        Antibiotic   Organism Organism Organism        ESCHERICHIA COLI        AMPICILLIN    >=32 RESISTANT   R Final            CEFAZOLIN    >=64 RESISTANT   R Final            CEFTRIAXONE    <=1 SENSITIVE   S Final            CIPROFLOXACIN    >=4 RESISTANT   R Final            GENTAMICIN    <=1 SENSITIVE   S Final            LEVOFLOXACIN    >=8 RESISTANT   R Final            NITROFURANTOIN    32 SENSITIVE   S Final            PIP/TAZO    8 SENSITIVE   S Final            TOBRAMYCIN    <=1 SENSITIVE   S Final            TRIMETH/SULFA    >=320 RESISTANT   R Final            Treatments: IV hydration, antibiotics: ceftriaxone, bactrim and doxycycline, analgesia: Dilaudid, oxycodone, percocet, anticoagulation: LMW heparin and xarelto, therapies: PT, OT, RN and SW and surgery: as above  Discharge Exam:  Orthopaedic Trauma Service Progress Note                 2 Days Post-Op  Subjective   Pt doing  ok However reports low back pain and B LEx shooting pains that have been present since the end of Feb.  Pt states that he fell from a standing height on the day he got home from the nursing home after his last hospital admission and landed right on his butt.  Since that time he has noted intermittent pain in his low back with occasional radiation to B LE that stops at his thighs. Gets relief when lying in a recumbent position.  Not related to any distance walked. No issues with bowel or bladder function. No numbness or tingling to buttock.  Has noted some weakness B as well  Pts pain is under control Ready to go to snf   No CP, no SOB No abd pain No N/V No h/a   Voiding on own and eating well  Objective  BP 135/62  Pulse 75  Temp(Src) 98.9 F (37.2 C) (Oral)  Resp 18  Ht 6\' 4"  (1.93 m)  Wt 144.244 kg (318 lb)  BMI 38.72 kg/m2  SpO2 96%  Patient Vitals for the past 24 hrs:   BP  Temp  Pulse  Resp  SpO2   02/08/13 0623  135/62 mmHg  98.9 F (37.2 C)  75  18  96 %   02/07/13 2212  130/56 mmHg  98.7 F (37.1 C)  72  18  99 %   02/07/13 1538  -  -  -  18  -   02/07/13 1300  126/52 mmHg  98.5 F (36.9 C)  69  18  91 %   02/07/13 1200  -  -  -  18  -     Intake/Output     04/30 0701 - 05/01 0700 05/01 0701 - 05/02 0700    P.O.  240    I.V. (mL/kg)      Other      IV Piggyback      Total Intake(mL/kg)  240 (1.7)    Urine (mL/kg/hr) 100 (0) 300 (0.9)    Total Output 100 300    Net -100 -60            Labs Results for BONIFACIO, PRUDEN (MRN [id number]) as of 02/08/2013 09:31   Ref. Range  02/08/2013 04:31   Sodium  Latest Range: 135-145 mEq/L  134 (L)   Potassium  Latest Range: 3.5-5.1 mEq/L  4.2   Chloride  Latest Range: 96-112 mEq/L  99   CO2  Latest Range: 19-32 mEq/L  29   BUN  Latest Range: 6-23 mg/dL  16   Creatinine  Latest Range: 0.50-1.35 mg/dL  4.09   Calcium  Latest Range: 8.4-10.5 mg/dL  8.7   GFR calc non Af Amer  Latest Range: >90 mL/min  87 (L)   GFR calc  Af Amer  Latest Range: >90 mL/min  >90   Glucose  Latest Range: 70-99 mg/dL  96   Alkaline Phosphatase  Latest Range: 39-117 U/L  147 (H)   Albumin  Latest Range: 3.5-5.2 g/dL  2.5 (L)   AST  Latest Range: 0-37 U/L  11   ALT  Latest Range: 0-53 U/L  10   Total Protein  Latest Range: 6.0-8.3 g/dL  5.7 (L)   Total Bilirubin  Latest Range: 0.3-1.2 mg/dL  0.4   WBC  Latest Range: 4.0-10.5 K/uL  5.8   RBC  Latest Range: 4.22-5.81 MIL/uL  3.70 (L)   Hemoglobin  Latest Range: 13.0-17.0 g/dL  9.9 (L)   HCT  Latest Range: 39.0-52.0 %  30.1 (L)   MCV  Latest Range: 78.0-100.0 fL  81.4   MCH  Latest Range: 26.0-34.0 pg  26.8   MCHC  Latest Range: 30.0-36.0 g/dL  21.3   RDW  Latest Range: 11.5-15.5 %  16.5 (H)   Platelets  Latest Range: 150-400 K/uL  257    Results for AMAHRI, DENGEL (MRN [id number]) as of 02/08/2013 09:31   Ref. Range  02/07/2013 09:00   Testosterone  Latest Range: 300-890 ng/dL  629 (L)    Imaging  X-rays of lumbar and thoracic spine demonstrates severe collapse and compression fracture to the L1 vertebra  CT scan and MRI are pending at the time of discharge  Exam  Gen: Awake and alert, lying comfortably in bed   Lungs: clear Cardiac: s1 and s2 Abd: + BS Ext:             Right Lower Extremity               Splint c/d/i             Distal motor and sensory functions intact             EHL, FHL, lesser toe motor intact             DPN, SPN, TN sensation intact             Ext is warm             + DP pulse             Compartments soft and NT       Left Lower Extremity             Motor and sensory functions and symmetric, weak on L leg as well               No acute findings noted             Can perform SLR but limited                 Assessment and Plan  2 Days Post-Op  67 y/o male s/p low energy ground level fall with comminuted R distal 1/3 tibial shaft fracture  1. Ground level fall 2. Comminuted closed distal 1/3 tibial shaft and fibular shaft  fracture s/p ORIF POD 2             NWB x 8 weeks             ROM of knee as tolerated             Hip ROM- total hip precautions             PT/OT                         Pt can WB thru R elbow to use platform walker                         WBAT L leg 3. Low back pain x 2 months             Pt gives history of low back pain for about 2 months with associated weakness, tingling and shooting pain.  Again this is occasional             Clinical picture is complicated given profound deconditioning as his mobility has been severely limited since 05/2012             Weakness may be related to muscle atrophy and disuse but other components of the history indicate possible back pathology such as vertebral compression fx and possibly central stenosis as B LEx are affected             No sensory disturbances are noted on exam and L leg demonstrates satisfactory strength but again picture complicated by deconditioning               Pt also has very poor bone and his fall from standing height could have definitely resulted in a compression fx             Will check complete lumbar and thoracic series               May consider MRI as well                3. Medical issues             Per medical team   4. ABL anemia:             Monitor             stable 5. Pain management:             Oral meds               6. DVT/PE prophylaxis/A-fib             lovenox d/c'd             xarelto 7. UTI             has been covered with rocephin, today is day #3             Will convert to oral agent at discharge for 7 days- nitrofurantoin (large MIC but E. coli sensitive )               8. ID:               Rocephin as above for UTI, change to nitrofurantoin at d/c               Doxycycline for gingival disease             Bactrim for osteo R wrist  9. Metabolic Bone Disease:                          Vitamin D level improved on this admission at 35, previously was round 15. Continue with  supplementation             Pt has only had 2 doses of testosterone thus far.  Previous Testosterone level was 26.00, repeat testosterone level is 289. Excellent response thus far.               Pts bone is extremely soft, clinically has osteoporosis             Discussed additional agents with the pt in terms of optimizing bone density, do believe pt is a candidate for Forteo therapy as it is also indicated for osteoporosis in testosterone deficient males             Continue with Vitamin D supplementation  as well               Additionally we will also get the pt a bone stimulator as an adjuvant for fx healing to the R leg  10. Activity:             OOB as tolerated             NWB R leg  11. FEN/Foley/Lines:             NSL IV             12.Ex-fix/Splint care:             Do not get splint wet             Maintain splint at all times  13. Impediments to fracture healing:             Testosterone deficiency             Osteoporosis             Low vitamin D               Immobility and deconditioning, balance issues   14. Dispo:             Therapies             hopeful for snf today             xrays throacic and lumbar spine        Orthopaedic Trauma Service Addendum  Followup x-rays of lumbar and thoracic spine show severe collapse and compression of the L1 vertebra. We have discussed Mr. [name] with Dr. Shon Baton, spine surgery for [facility] orth[facility]. He is more than happy to see the patient. He does recommend placing the patient in a Jewett hyperextension brace. We'll obtain a CT scan and MRI for additional workup.  Patient is still stable for discharge to skilled nursing facility today and is to wear his brace at all times especially when mobilizing. This can be removed for hygiene but then placed back on.  We will also arrange for the patient to followup with an endocrinologist as well. Again I do feel that his metabolic bone disease is directly related to his  testosterone deficiency. However patient likely to to evaluate the etiology of his testosterone deficiency. I did consider ordering LH, FSH and Sex hormone binding globulin here at the hospital however I am unclear as to the effect that supplemental testosterone will have on these. I prefer to defer this to the endocrinologist, we will refer to [facility] endocrinology as an outpatient.  Patient will followup with Dr. Shon Baton in 2 weeks as well for reevaluation of his lumbar compression fracture.   Mearl Latin, PA-C  [facility]  [phone] (P)  02/08/2013  1:02 PM    Disposition: SNF- [place]      Discharge Orders   Future Appointments Provider Department Dept Phone   03/20/2013 10:00 AM Cliffton Asters, MD [facility] [phone]   Future Orders Complete By Expires     Call MD / Call 911  As directed     Comments:      If you experience chest pain or shortness of breath, CALL 911 and be transported to the hospital emergency room.  If you develope a fever above 101 F, pus (white drainage) or increased drainage or redness at the wound, or calf pain, call your surgeon's office.  Constipation Prevention  As directed     Comments:      Drink plenty of fluids.  Prune juice may be helpful.  You may use a stool softener, such as Colace (over the counter) 100 mg twice a day.  Use MiraLax (over the counter) for constipation as needed.    Diet Carb Modified  As directed     Discharge instructions  As directed     Comments:      Orthopaedic Trauma Service Discharge Instructions   General Discharge Instructions  WEIGHT BEARING STATUS: Nonweight Bearing Right Leg  RANGE OF MOTION/ACTIVITY:  Activity as tolerated while maintaining NWB restrictions on Right LEG Can Weight Bear through R elbow R hip- posterior total hip precautions  Do not remove splint from R leg   Diet: as you were eating previously.  Can use over the counter stool  softeners and bowel preparations, such as Miralax, to help with bowel movements.  Narcotics can be constipating.  Be sure to drink plenty of fluids  STOP SMOKING OR USING NICOTINE PRODUCTS!!!!  As discussed nicotine severely impairs your body's ability to heal surgical and traumatic wounds but also impairs bone healing.  Wounds and bone heal by forming microscopic blood vessels (angiogenesis) and nicotine is a vasoconstrictor (essentially, shrinks blood vessels).  Therefore, if vasoconstriction occurs to these microscopic blood vessels they essentially disappear and are unable to deliver necessary nutrients to the healing tissue.  This is one modifiable factor that you can do to dramatically increase your chances of healing your injury.    (This means no smoking, no nicotine gum, patches, etc)  DO NOT USE NONSTEROIDAL ANTI-INFLAMMATORY DRUGS (NSAID'S)  Using products such as Advil (ibuprofen), Aleve (naproxen), Motrin (ibuprofen) for additional pain control during fracture healing can delay and/or prevent the healing response.  If you would like to take over the counter (OTC) medication, Tylenol (acetaminophen) is ok.  However, some narcotic medications that are given for pain control contain acetaminophen as well. Therefore, you should not exceed more than 4000 mg of tylenol in a day if you do not have liver disease.  Also note that there are may OTC medicines, such as cold medicines and allergy medicines that my contain tylenol as well.  If you have any questions about medications and/or interactions please ask your doctor/PA or your pharmacist.   PAIN MEDICATION USE AND EXPECTATIONS  You have likely been given narcotic medications to help control your pain.  After a traumatic event that results in an fracture (broken bone) with or without surgery, it is ok to use narcotic pain medications to help control one's pain.  We understand that everyone responds to pain differently and each individual patient  will be evaluated on a regular basis for the continued need for narcotic medications. Ideally, narcotic medication use should last no more than 6-8 weeks (coinciding with fracture healing).   As a patient it is your responsibility as well to monitor narcotic medication use and report the amount and frequency you use these medications when you come to your office visit.   We would also advise that if you are using narcotic medications, you should take a dose prior to therapy to maximize you participation.  IF YOU ARE ON NARCOTIC MEDICATIONS IT IS NOT PERMISSIBLE TO OPERATE A MOTOR VEHICLE (MOTORCYCLE/CAR/TRUCK/MOPED) OR HEAVY MACHINERY DO NOT MIX NARCOTICS WITH OTHER CNS (CENTRAL NERVOUS SYSTEM) DEPRESSANTS SUCH AS ALCOHOL       ICE AND ELEVATE INJURED/OPERATIVE EXTREMITY  Using ice and  elevating the injured extremity above your heart can help with swelling and pain control.  Icing in a pulsatile fashion, such as 20 minutes on and 20 minutes off, can be followed.    Do not place ice directly on skin. Make sure there is a barrier between to skin and the ice pack.    Using frozen items such as frozen peas works well as the conform nicely to the are that needs to be iced.  USE AN ACE WRAP OR TED HOSE FOR SWELLING CONTROL  In addition to icing and elevation, Ace wraps or TED hose are used to help limit and resolve swelling.  It is recommended to use Ace wraps or TED hose until you are informed to stop.    When using Ace Wraps start the wrapping distally (farthest away from the body) and wrap proximally (closer to the body)   Example: If you had surgery on your leg or thing and you do not have a splint on, start the ace wrap at the toes and work your way up to the thigh        If you had surgery on your upper extremity and do not have a splint on, start the ace wrap at your fingers and work your way up to the upper arm  IF YOU ARE IN A SPLINT OR CAST DO NOT REMOVE IT FOR ANY REASON   If your splint  gets wet for any reason please contact the office immediately. You may shower in your splint or cast as long as you keep it dry.  This can be done by wrapping in a cast cover or garbage back (or similar)  Do Not stick any thing down your splint or cast such as pencils, money, or hangers to try and scratch yourself with.  If you feel itchy take benadryl as prescribed on the bottle for itching  IF YOU ARE IN A CAM BOOT (BLACK BOOT)  You may remove boot periodically. Perform daily dressing changes as noted below.  Wash the liner of the boot regularly and wear a sock when wearing the boot. It is recommended that you sleep in the boot until told otherwise  CALL THE OFFICE WITH ANY QUESTIONS OR CONCERTS: [phone]    Driving restrictions  As directed     Comments:      No driving    Increase activity slowly as tolerated  As directed     Non weight bearing  As directed     Comments:      Right Leg  WBAT through R elbow    Posterior total hip precautions  As directed     Comments:      Right        Medication List    TAKE these medications       amiodarone 200 MG tablet  Commonly known as:  PACERONE  Take 100 mg by mouth 2 (two) times daily.     atorvastatin 80 MG tablet  Commonly known as:  LIPITOR  Take 80 mg by mouth at bedtime.     clonazePAM 2 MG tablet  Commonly known as:  KLONOPIN  Take 1 tablet (2 mg total) by mouth 3 (three) times daily.     clorazepate 15 MG tablet  Commonly known as:  TRANXENE-T  Take 1 tablet (15 mg total) by mouth daily as needed for anxiety.     diltiazem 240 MG 24 hr capsule  Commonly known as:  DILACOR XR  Take  240 mg by mouth daily.     doxycycline 100 MG DR capsule  Commonly known as:  DORYX  Take 100 mg by mouth 2 (two) times daily.     DSS 100 MG Caps  Take 100 mg by mouth 2 (two) times daily as needed for constipation.     DULoxetine 60 MG capsule  Commonly known as:  CYMBALTA  Take 60 mg by mouth daily.     etanercept 50 MG/ML  injection  Commonly known as:  ENBREL  Inject 50 mg into the skin once a week. Takes on Mondays     levothyroxine 125 MCG tablet  Commonly known as:  SYNTHROID, LEVOTHROID  Take 2 tablets (250 mcg total) by mouth daily before breakfast.     loratadine 10 MG tablet  Commonly known as:  CLARITIN  Take 10 mg by mouth daily.     methocarbamol 500 MG tablet  Commonly known as:  ROBAXIN  Take 1-2 tablets (500-1,000 mg total) by mouth every 6 (six) hours as needed.     metoprolol tartrate 25 MG tablet  Commonly known as:  LOPRESSOR  Take 25 mg by mouth daily as needed. For increased heart rate     multivitamin with minerals Tabs  Take 1 tablet by mouth daily.     nitrofurantoin (macrocrystal-monohydrate) 100 MG capsule  Commonly known as:  MACROBID  Take 1 capsule (100 mg total) by mouth every 12 (twelve) hours.  Start taking on:  02/09/2013     nitroGLYCERIN 0.4 MG SL tablet  Commonly known as:  NITROSTAT  Place 0.4 mg under the tongue every 5 (five) minutes as needed for chest pain.     omeprazole 20 MG capsule  Commonly known as:  PRILOSEC  Take 20 mg by mouth daily.     oxyCODONE 5 MG immediate release tablet  Commonly known as:  Oxy IR/ROXICODONE  Take 1-2 tablets (5-10 mg total) by mouth every 4 (four) hours as needed for pain.     oxyCODONE-acetaminophen 5-325 MG per tablet  Commonly known as:  PERCOCET/ROXICET  Take 1-2 tablets by mouth every 6 (six) hours as needed for pain.     polyethylene glycol packet  Commonly known as:  MIRALAX / GLYCOLAX  Take 17 g by mouth daily.     PROBIOTIC PO  Take 1 tablet by mouth daily.     ranolazine 500 MG 12 hr tablet  Commonly known as:  RANEXA  Take 500 mg by mouth 2 (two) times daily.     sulfamethoxazole-trimethoprim 800-160 MG per tablet  Commonly known as:  BACTRIM DS  TAKE 1 TABLET BY MOUTH TWICE A DAY     tamsulosin 0.4 MG Caps  Commonly known as:  FLOMAX  Take 0.4 mg by mouth daily.     testosterone cypionate  200 MG/ML injection  Commonly known as:  DEPOTESTOTERONE CYPIONATE  Inject 200 mg into the muscle every 14 (fourteen) days.     VITAMIN C PO  Take 1 tablet by mouth daily.     Vitamin D 2000 UNITS Caps  Take 1 capsule by mouth daily.     XARELTO 20 MG Tabs  Generic drug:  Rivaroxaban  Take 20 mg by mouth daily with supper.       Follow-up Information   Follow up with HANDY,MICHAEL H, MD. Schedule an appointment as soon as possible for a visit in 10 days. (call for appointment )    Contact information:   [address] [place] [address] [phone]       Discharge Instructions and Plan:  Mr. Matthis has many orthopedic issues that complicate his overall clinical picture. He has been with restricted mobility since August of 2013 further complicating his clinical picture and adding a component of deconditioning to his recovery process. His injuries are now further complicated by an L1 compression fracture which will need to be followed closely by a spine surgeon. We placed in a Jewett brace to prevent any further flexion deformity.   Mr. Lamadrid will remain nonweightbearing on his right leg given his acute right tibial shaft fracture. He'll remain in his current splint for the next 10 days. We will remove the splint when he comes in for followup to our office. He is allowed unrestricted range of motion of his right knee. He has total hip precautions to the right hip. No range of motion restrictions to the left lower extremity.  We do feel that the main issue with Mr. Holliman is his poor bone density and metabolic bone disease. Again on his admission back in December of 2013 we are able to make a diagnosis of testosterone deficiency. We attempted to make a endocrinology referral at that time but it appears that this was not followed through by the patient. Regardless patient has since been started on testosterone therapy. However we do feel that it  is necessary to determine the possible etiology for his testosterone deficiency. I did consider ordering LH and FSH are in the hospital along with sex hormone binding globulin however I'm unclear as to the effects that supplemental testosterone that when he is labs. As such we will defer this to the endocrinologist.  Again on a positive note patient's testosterone levels have risen significantly since starting his therapy. His vitamin D levels have also risen as well and are currently at 35 ng/ mL  Ideally you like to optimize patient's bone density is in the event he does need a additional procedures including interventions on a spine. Given his testosterone deficiency you feel that he is a candidate for a Forteo therapy which I do think would be wise intervention we can discuss this with the endocrinologist as well as PCP once the patient has a endocrinology evaluation. Again we need to optimize his bone density to promote adequate healing of his fractures as well as to increase his bone density so he does not suffer any future fractures.  Patient will followup in the [facility] in 10-14 days and will followup with Dr. Shon Baton in 2 weeks. We are currently working on endocrinology referral to [facility] and will inform the patient when this is to take place.  Given the presence of extended spectrum beta-lactamase organism causing the patient's UTI he'll be discharged on Macrobid for the next 7 days or therapy.  Also the hospitalist did make a adjustment to the patient levothyroxine based on patient's TSH levels and increased to 250 mcg. This will need to be reevaluated by the PCP. Also unclear if patient's concomitant use of amiodarone affecting his thyroid function studies.  The patient will resume his diet, carbohydrate modified  Patient borders there was a question or concerns regarding his orthopedic issues  Signed:  Mearl Latin, PA-C [facility] [phone] ([phone] 02/08/2013, 10:22 AM

## 2013-02-09 DIAGNOSIS — D649 Anemia, unspecified: Secondary | ICD-10-CM

## 2013-02-09 DIAGNOSIS — S82209A Unspecified fracture of shaft of unspecified tibia, initial encounter for closed fracture: Secondary | ICD-10-CM

## 2013-02-09 LAB — CBC
Hemoglobin: 9.7 g/dL — ABNORMAL LOW (ref 13.0–17.0)
MCH: 26.4 pg (ref 26.0–34.0)
MCHC: 32.6 g/dL (ref 30.0–36.0)
Platelets: 254 10*3/uL (ref 150–400)

## 2013-02-09 LAB — COMPREHENSIVE METABOLIC PANEL
ALT: 11 U/L (ref 0–53)
AST: 12 U/L (ref 0–37)
Albumin: 2.5 g/dL — ABNORMAL LOW (ref 3.5–5.2)
Alkaline Phosphatase: 145 U/L — ABNORMAL HIGH (ref 39–117)
Calcium: 8.8 mg/dL (ref 8.4–10.5)
GFR calc Af Amer: >90 mL/min (ref >90)
Glucose, Bld: 106 mg/dL — ABNORMAL HIGH (ref 70–99)
Potassium: 3.8 mEq/L (ref 3.5–5.1)
Sodium: 134 mEq/L — ABNORMAL LOW (ref 135–145)
Total Protein: 5.8 g/dL — ABNORMAL LOW (ref 6.0–8.3)

## 2013-02-09 NOTE — Progress Notes (Signed)
Patient with no complaints. No BM. Passing gas, no abdominal pain. Continue with currents medications. Vitals and labs stable. Patient to follow up with Dr Nehemiah Settle for L 1 compression fracture. Patient to be discharge today.  Joseph Regalao, Md.

## 2013-02-09 NOTE — Consult Note (Signed)
[name], [name] NO.:  [id number]  MEDICAL RECORD NO.:  [id number]  LOCATION:  5N06C                        FACILITY:  [facility]  PHYSICIAN:  Alvy Beal, MD    DATE OF BIRTH:  04-25-1946  DATE OF CONSULTATION:  02/09/2013 DATE OF DISCHARGE:  02/05/2013                                CONSULTATION   CONSULTATION REQUESTED BY:  Doralee Albino. Carola Frost, M.D.  REASON FOR CONSULTATION:  Vertebral fracture.  HISTORY:  This is a very pleasant 67 year old male with multiple medical issues including multiple prior fractures and nonunions.  He has been a patient of my partners, Dr. Charlann Boxer and Dr. Amanda Pea.  The patient fell on object sustaining pain and deformity of the right leg.  He was initially seen at [facility] and transferred here for definitive orthopedic management.  The patient ultimately underwent an ORIF of the distal 1/3 tibial and fibular shaft fracture.  During the course of his workup, it was noted that he was having significant complaints of back pain.  He ultimately had x-rays of his lumbar spine done on Feb 08, 2013, which demonstrated vertebral plana secondary to significant compression fracture of L1.  There is marked localized kyphosis.  The patient also was noted to have diffuse osteopenia.  Ultimately, I was asked to evaluate the patient secondary to this.  The patient has had an MRI done yesterday.  It demonstrates severe compression fracture of the L1 vertebral body with protrusion of the posterior aspect of the vertebrae into the spinal canal, just at the inferior level of the conus.  It approximately takes about 30%-40% of the canal.  T10-T11, T11-T12 was normal.  L2-L3, L3-L4 is normal.  L4- L5, there is evidence of stenosis with degenerative facet changes, but no acute fracture.  L5-S1 tiny broad-based disk bulge.  The patient has also had a CT scan of his thoracolumbar spine, which again demonstrated the L1 fracture and it appears to be  consisting with a benign osteopenic fracture without obvious lytic destruction of the vertebral body.  There is again retropulsion of the bone with severe stenosis consistent with the MRI findings.  PHYSICAL EXAMINATION:  GENERAL:  He is a pleasant gentleman, appears stated age in no acute distress.  He is alert.  He is oriented x3.  He denies a history of incontinence of bowel and bladder.  He denies perianal or saddle dysesthesia.  He has surgical dressing on the right lower extremity.  He has a healed right total knee replacements incision.  He is grossly neurologically intact.  EHL, tibialis anterior, gastrocnemius are intact bilaterally.  He did state that prior to this injury, he was ambulatory without any issues.  He has no shortness of breath or chest pain.  The abdomen is soft and nontender.  He has moderate to significant back pain with deep palpation.  No obvious ecchymosis or bruising.  Intact peripheral pulses throughout.  At this point in time, clinically the patient does not demonstrate evidence of cauda equina or conus injury.  Although there is significant stenosis neurologically, he is grossly intact.  I have some reservations about recommending an acute surgical intervention given the metabolic bone disease that  he has.  After discussing this with Dr. Carola Frost, he indicated that there was significant osteopenia of the extremity, which made fixation somewhat challenging. As a result, at this point in time, I have recommended that we treat him in a Jewett brace.  He is morbidly obese, and I think this will be easier for him to use than a TLSO.  I also recommended a formal metabolic bone workup with an endocrinologist so that we could determine whether or not there is more affecting him than the testosterone issue.  I have reviewed all of the notes and I am familiar with his history.  I have spoken with the patient and examined him, and he will follow up with me in about  a week or 2 so that we can determine how best to proceed.  My hope is at that point in time, he would have had his metabolic bone workup, and we can determine how best to proceed.     Alvy Beal, MD     DDB/MEDQ  D:  02/09/2013  T:  02/09/2013  Job:  [id number]

## 2013-02-09 NOTE — Progress Notes (Signed)
Physical Therapy Treatment Patient Details Name: [name] MRN: [id number] DOB: 25-Oct-1945 Today's Date: 02/09/2013 Time: 4098-1191 PT Time Calculation (min): 24 min  PT Assessment / Plan / Recommendation Comments on Treatment Session  Pt and husband educated for home care management; pt very anxious about D/C plan. Recommend HHPT upon acute D/C.     Follow Up Recommendations        Does the patient have the potential to tolerate intense rehabilitation     Barriers to Discharge        Equipment Recommendations  Wheelchair (measurements PT);Wheelchair cushion (measurements PT)    Recommendations for Other Services    Frequency Min 3X/week   Plan Discharge plan remains appropriate;Frequency remains appropriate    Precautions / Restrictions Restrictions Weight Bearing Restrictions: Yes RLE Weight Bearing: Non weight bearing   Pertinent Vitals/Pain Denies pain at rest.    Mobility  Bed Mobility Bed Mobility: Not assessed Transfers Transfers: Not assessed Ambulation/Gait Ambulation/Gait Assistance: Not tested (comment) Stairs: No Wheelchair Mobility Wheelchair Mobility: No    Exercises Other Exercises Other Exercises: discussed and demo transfer techniques with pt and family member; multiple transfer stratgies for home and car. Pt very anxious and fearful of falling at home; discuessed lateral and ant/posterior transfers for bed <> wheelchair. Gave demo on SPT techniques with husband as pt and teachback technique with platform RW; pt continued to be hesitant about SPT with platform RW; discussed with pt importance of continuing mobility on L LE to maintain adequate strength. Pt and husband were appreciative and able to verbalize correct technique back. Pt and husband have all equipment necessary and a gt belt; encouraged to utilize gt belt for safety of both.    PT Diagnosis:    PT Problem List:   PT Treatment Interventions:     PT Goals Acute Rehab PT Goals PT  Goal Formulation: With patient  Visit Information  Last PT Received On: 02/09/13    Subjective Data  Subjective: "I am just worried that my husband is not going to be able to take care of me when we get home."    Cognition  Cognition Arousal/Alertness: Awake/alert Behavior During Therapy: WFL for tasks assessed/performed Overall Cognitive Status: Within Functional Limits for tasks assessed    Balance     End of Session PT - End of Session Activity Tolerance: Patient tolerated treatment well Patient left: in bed;with call bell/phone within reach;with family/visitor present Nurse Communication: Other (comment) (No DME needs)   GP     Donell Sievert,  [place] [phone] 02/09/2013, 3:36 PM

## 2013-02-09 NOTE — Progress Notes (Signed)
Patient provided with discharge instructions and follow up information. HE is going home with HHPT and OT is set up by [facility]. Spouse will be able to be at home with him 24/7.

## 2013-02-09 NOTE — Progress Notes (Signed)
PT Cancellation Note  Patient Details Name: [name] MRN: [id number] DOB: 19-Jul-1946   Cancelled Treatment:    Reason Eval/Treat Not Completed: Pain limiting ability to participate. Attempted treatment and pt refused stating "I am not doing anything with therapy today. I plan on being in a wheelchair until I get my back surgery".  Will attempt therapy at a later time, when pt is agreeable to participate.    Donnamarie Poag [place], [place] [phone] 02/09/2013, 10:06 AM

## 2013-02-09 NOTE — Consult Note (Addendum)
Full note dictated  - report no.: [phone] Briefly: Vertebral fx with markedkyphosis  Neuro intact  Comfortable with brace  Recommend metabolic bone work up  Non-operative tx at this time  May require reconstructive procedure in the future.

## 2013-02-10 LAB — VITAMIN D 1,25 DIHYDROXY
Vitamin D2 1, 25 (OH)2: <8 pg/mL
Vitamin D3 1, 25 (OH)2: 50 pg/mL

## 2013-02-15 ENCOUNTER — Ambulatory Visit: Payer: Medicare Other | Admitting: Internal Medicine

## 2013-02-21 ENCOUNTER — Observation Stay (HOSPITAL_COMMUNITY)
Admission: AD | Admit: 2013-02-21 | Discharge: 2013-02-24 | Disposition: A | Discharge status: Skilled Nursing Facility | Payer: Medicare Other | Source: Other Acute Inpatient Hospital | Attending: Internal Medicine | Admitting: Internal Medicine

## 2013-02-21 DIAGNOSIS — G4730 Sleep apnea, unspecified: Secondary | ICD-10-CM | POA: Insufficient documentation

## 2013-02-21 DIAGNOSIS — I480 Paroxysmal atrial fibrillation: Secondary | ICD-10-CM

## 2013-02-21 DIAGNOSIS — S8290XD Unspecified fracture of unspecified lower leg, subsequent encounter for closed fracture with routine healing: Secondary | ICD-10-CM | POA: Insufficient documentation

## 2013-02-21 DIAGNOSIS — F32A Depression, unspecified: Secondary | ICD-10-CM | POA: Diagnosis present

## 2013-02-21 DIAGNOSIS — Z7901 Long term (current) use of anticoagulants: Secondary | ICD-10-CM

## 2013-02-21 DIAGNOSIS — F329 Major depressive disorder, single episode, unspecified: Secondary | ICD-10-CM | POA: Diagnosis present

## 2013-02-21 DIAGNOSIS — Z79899 Other long term (current) drug therapy: Secondary | ICD-10-CM | POA: Insufficient documentation

## 2013-02-21 DIAGNOSIS — R55 Syncope and collapse: Principal | ICD-10-CM

## 2013-02-21 DIAGNOSIS — I4811 Longstanding persistent atrial fibrillation: Secondary | ICD-10-CM | POA: Diagnosis present

## 2013-02-21 DIAGNOSIS — I2510 Atherosclerotic heart disease of native coronary artery without angina pectoris: Secondary | ICD-10-CM

## 2013-02-21 DIAGNOSIS — D649 Anemia, unspecified: Secondary | ICD-10-CM

## 2013-02-21 DIAGNOSIS — Z9119 Patient's noncompliance with other medical treatment and regimen: Secondary | ICD-10-CM | POA: Insufficient documentation

## 2013-02-21 DIAGNOSIS — E785 Hyperlipidemia, unspecified: Secondary | ICD-10-CM

## 2013-02-21 DIAGNOSIS — Z9181 History of falling: Secondary | ICD-10-CM | POA: Insufficient documentation

## 2013-02-21 DIAGNOSIS — I10 Essential (primary) hypertension: Secondary | ICD-10-CM | POA: Insufficient documentation

## 2013-02-21 DIAGNOSIS — K219 Gastro-esophageal reflux disease without esophagitis: Secondary | ICD-10-CM | POA: Insufficient documentation

## 2013-02-21 DIAGNOSIS — F3289 Other specified depressive episodes: Secondary | ICD-10-CM | POA: Insufficient documentation

## 2013-02-21 DIAGNOSIS — Z91199 Patient's noncompliance with other medical treatment and regimen due to unspecified reason: Secondary | ICD-10-CM | POA: Insufficient documentation

## 2013-02-21 DIAGNOSIS — S82201A Unspecified fracture of shaft of right tibia, initial encounter for closed fracture: Secondary | ICD-10-CM

## 2013-02-21 DIAGNOSIS — E559 Vitamin D deficiency, unspecified: Secondary | ICD-10-CM | POA: Diagnosis present

## 2013-02-21 DIAGNOSIS — W19XXXA Unspecified fall, initial encounter: Secondary | ICD-10-CM

## 2013-02-21 DIAGNOSIS — E039 Hypothyroidism, unspecified: Secondary | ICD-10-CM | POA: Diagnosis present

## 2013-02-21 DIAGNOSIS — M8448XA Pathological fracture, other site, initial encounter for fracture: Secondary | ICD-10-CM | POA: Insufficient documentation

## 2013-02-21 DIAGNOSIS — W19XXXD Unspecified fall, subsequent encounter: Secondary | ICD-10-CM

## 2013-02-21 DIAGNOSIS — I4891 Unspecified atrial fibrillation: Secondary | ICD-10-CM

## 2013-02-21 LAB — TROPONIN I: Troponin I: <0.3 ng/mL (ref <0.30)

## 2013-02-21 LAB — TSH: TSH: 2.904 u[IU]/mL (ref 0.350–4.500)

## 2013-02-21 MED ORDER — DOXYCYCLINE HYCLATE 100 MG PO TABS
100.0000 mg | ORAL_TABLET | Freq: Every day | ORAL | Status: DC
  Administered 2013-02-21 – 2013-02-24 (×4): 100 mg via ORAL
  Filled 2013-02-21 (×4): qty 1

## 2013-02-21 MED ORDER — DOXYCYCLINE MONOHYDRATE 100 MG PO CAPS: 100.0000 mg | ORAL_CAPSULE | Freq: Every day | ORAL | Status: DC

## 2013-02-21 MED ORDER — PROBIOTIC PO CAPS: 1.0000 | ORAL_CAPSULE | Freq: Every day | ORAL | Status: DC

## 2013-02-21 MED ORDER — DULOXETINE HCL 60 MG PO CPEP
60.0000 mg | ORAL_CAPSULE | Freq: Every day | ORAL | Status: DC
  Administered 2013-02-21 – 2013-02-24 (×4): 60 mg via ORAL
  Filled 2013-02-21 (×4): qty 1

## 2013-02-21 MED ORDER — LORATADINE 10 MG PO TABS
10.0000 mg | ORAL_TABLET | Freq: Every day | ORAL | Status: DC
  Administered 2013-02-21 – 2013-02-24 (×4): 10 mg via ORAL
  Filled 2013-02-21 (×4): qty 1

## 2013-02-21 MED ORDER — ATORVASTATIN CALCIUM 80 MG PO TABS
80.0000 mg | ORAL_TABLET | Freq: Every day | ORAL | Status: DC
  Administered 2013-02-21 – 2013-02-23 (×3): 80 mg via ORAL
  Filled 2013-02-21 (×4): qty 1

## 2013-02-21 MED ORDER — RANOLAZINE ER 500 MG PO TB12
500.0000 mg | ORAL_TABLET | Freq: Two times a day (BID) | ORAL | Status: DC
  Administered 2013-02-21 – 2013-02-24 (×7): 500 mg via ORAL
  Filled 2013-02-21 (×8): qty 1

## 2013-02-21 MED ORDER — POLYETHYLENE GLYCOL 3350 17 G PO PACK
17.0000 g | PACK | Freq: Every day | ORAL | Status: DC
  Administered 2013-02-22 – 2013-02-23 (×2): 17 g via ORAL
  Filled 2013-02-21 (×4): qty 1

## 2013-02-21 MED ORDER — METHOCARBAMOL 500 MG PO TABS
500.0000 mg | ORAL_TABLET | Freq: Four times a day (QID) | ORAL | Status: DC | PRN
  Filled 2013-02-21: qty 2

## 2013-02-21 MED ORDER — TESTOSTERONE CYPIONATE 200 MG/ML IM SOLN: 200.0000 mg | INTRAMUSCULAR | Status: DC

## 2013-02-21 MED ORDER — VITAMIN D 50 MCG (2000 UT) PO CAPS: 1.0000 | ORAL_CAPSULE | Freq: Every day | ORAL | Status: DC

## 2013-02-21 MED ORDER — DILTIAZEM HCL ER 240 MG PO CP24
240.0000 mg | ORAL_CAPSULE | Freq: Every day | ORAL | Status: DC
  Administered 2013-02-21 – 2013-02-24 (×4): 240 mg via ORAL
  Filled 2013-02-21 (×4): qty 1

## 2013-02-21 MED ORDER — DOCUSATE SODIUM 100 MG PO CAPS: 100.0000 mg | ORAL_CAPSULE | Freq: Two times a day (BID) | ORAL | Status: DC | PRN

## 2013-02-21 MED ORDER — NITROGLYCERIN 0.4 MG SL SUBL: 0.4000 mg | SUBLINGUAL_TABLET | SUBLINGUAL | Status: DC | PRN

## 2013-02-21 MED ORDER — CLONAZEPAM 1 MG PO TABS
2.0000 mg | ORAL_TABLET | Freq: Three times a day (TID) | ORAL | Status: DC
  Administered 2013-02-21 – 2013-02-24 (×10): 2 mg via ORAL
  Filled 2013-02-21 (×10): qty 2

## 2013-02-21 MED ORDER — SULFAMETHOXAZOLE-TMP DS 800-160 MG PO TABS
1.0000 | ORAL_TABLET | Freq: Every day | ORAL | Status: DC
  Administered 2013-02-21 – 2013-02-24 (×4): 1 via ORAL
  Filled 2013-02-21 (×4): qty 1

## 2013-02-21 MED ORDER — VITAMIN C 500 MG PO TABS
500.0000 mg | ORAL_TABLET | Freq: Every day | ORAL | Status: DC
  Administered 2013-02-21 – 2013-02-24 (×4): 500 mg via ORAL
  Filled 2013-02-21 (×4): qty 1

## 2013-02-21 MED ORDER — TESTOSTERONE CYPIONATE 200 MG/ML IM SOLN
200.0000 mg | INTRAMUSCULAR | Status: DC
  Administered 2013-02-22: 200 mg via INTRAMUSCULAR

## 2013-02-21 MED ORDER — TAMSULOSIN HCL 0.4 MG PO CAPS
0.4000 mg | ORAL_CAPSULE | Freq: Every day | ORAL | Status: DC
  Administered 2013-02-21 – 2013-02-24 (×4): 0.4 mg via ORAL
  Filled 2013-02-21 (×4): qty 1

## 2013-02-21 MED ORDER — SODIUM CHLORIDE 0.9 % IJ SOLN
3.0000 mL | Freq: Two times a day (BID) | INTRAMUSCULAR | Status: DC
  Administered 2013-02-21 – 2013-02-23 (×5): 3 mL via INTRAVENOUS

## 2013-02-21 MED ORDER — RIVAROXABAN 20 MG PO TABS
20.0000 mg | ORAL_TABLET | Freq: Every day | ORAL | Status: DC
  Administered 2013-02-21 – 2013-02-23 (×3): 20 mg via ORAL
  Filled 2013-02-21 (×4): qty 1

## 2013-02-21 MED ORDER — LEVOTHYROXINE SODIUM 125 MCG PO TABS
250.0000 ug | ORAL_TABLET | Freq: Every day | ORAL | Status: DC
  Administered 2013-02-21 – 2013-02-24 (×4): 250 ug via ORAL
  Filled 2013-02-21 (×5): qty 2

## 2013-02-21 MED ORDER — RISAQUAD PO CAPS
1.0000 | ORAL_CAPSULE | Freq: Every day | ORAL | Status: DC
  Administered 2013-02-21 – 2013-02-24 (×4): 1 via ORAL
  Filled 2013-02-21 (×4): qty 1

## 2013-02-21 MED ORDER — VITAMIN D3 25 MCG (1000 UNIT) PO TABS
2000.0000 [IU] | ORAL_TABLET | Freq: Every day | ORAL | Status: DC
  Administered 2013-02-21 – 2013-02-24 (×4): 2000 [IU] via ORAL
  Filled 2013-02-21 (×4): qty 2

## 2013-02-21 MED ORDER — AMIODARONE HCL 100 MG PO TABS
100.0000 mg | ORAL_TABLET | Freq: Two times a day (BID) | ORAL | Status: DC
  Administered 2013-02-21 – 2013-02-22 (×3): 100 mg via ORAL
  Filled 2013-02-21 (×4): qty 1

## 2013-02-21 MED ORDER — SODIUM CHLORIDE 0.9 % IV SOLN: 250.0000 mL | INTRAVENOUS | Status: DC | PRN

## 2013-02-21 MED ORDER — OXYCODONE HCL 5 MG PO TABS
5.0000 mg | ORAL_TABLET | ORAL | Status: DC | PRN
  Administered 2013-02-21: 5 mg via ORAL
  Administered 2013-02-21 – 2013-02-24 (×6): 10 mg via ORAL
  Filled 2013-02-21 (×6): qty 2

## 2013-02-21 MED ORDER — SODIUM CHLORIDE 0.9 % IJ SOLN: 3.0000 mL | INTRAMUSCULAR | Status: DC | PRN

## 2013-02-21 MED ORDER — SODIUM CHLORIDE 0.9 % IJ SOLN
3.0000 mL | Freq: Two times a day (BID) | INTRAMUSCULAR | Status: DC
  Administered 2013-02-21 – 2013-02-22 (×3): 3 mL via INTRAVENOUS

## 2013-02-21 MED ORDER — PANTOPRAZOLE SODIUM 40 MG PO TBEC
40.0000 mg | DELAYED_RELEASE_TABLET | Freq: Every day | ORAL | Status: DC
  Administered 2013-02-21 – 2013-02-24 (×4): 40 mg via ORAL
  Filled 2013-02-21 (×3): qty 1

## 2013-02-21 MED ORDER — METOPROLOL TARTRATE 25 MG PO TABS
25.0000 mg | ORAL_TABLET | Freq: Two times a day (BID) | ORAL | Status: DC
  Administered 2013-02-21 – 2013-02-24 (×7): 25 mg via ORAL
  Filled 2013-02-21 (×8): qty 1

## 2013-02-21 NOTE — Progress Notes (Signed)
Patient seen and examined. He denies chest pain. He does relates dizziness on standing. He denies any pain.  1-syncope; check orthostatic. Cycle cardiac enzymes, carotid doppler. ECHO ordered. Monitor on telemetry. Continue with medication for Afib.  [name].

## 2013-02-21 NOTE — Progress Notes (Signed)
VASCULAR LAB PRELIMINARY  PRELIMINARY  PRELIMINARY  PRELIMINARY  Carotid duplex  completed.    Preliminary report:  Bilateral:  No evidence of hemodynamically significant internal carotid artery stenosis.   Vertebral artery flow is antegrade.      Laporsha Grealish, RVT 02/21/2013, 3:38 PM

## 2013-02-21 NOTE — Care Management Note (Unsigned)
    Page 1 of 1   02/22/2013     3:50:13 PM   CARE MANAGEMENT NOTE 02/22/2013  Patient:  [name], [name]   Account Number:  [id number]  Date Initiated:  02/21/2013  Documentation initiated by:  Nusayba Cadenas  Subjective/Objective Assessment:   PT ADM WITH SYNCOPE, AFIB.  PAST RECENT HX OF FIB/TIB REPAIR.  PTA, PT RESIDES AT HOME WITH SPOUSE.     Action/Plan:   WILL FOLLOW FOR HOME NEEDS AS PT PROGRESSES.   Anticipated DC Date:  02/22/2013   Anticipated DC Plan:  SKILLED NURSING FACILITY      DC Planning Services  CM consult      Choice offered to / List presented to:             Status of service:  In process, will continue to follow Medicare Important Message given?   (If response is "NO", the following Medicare IM given date fields will be blank) Date Medicare IM given:   Date Additional Medicare IM given:    Discharge Disposition:    Per UR Regulation:  Reviewed for med. necessity/level of care/duration of stay  If discussed at Long Length of Stay Meetings, dates discussed:    Comments:  02/22/13 Rosalita Chessman [phone] PT/OT RECOMMENDING SNF PLACEMENT FOR REHAB.  WILL CONSULT CSW TO FACILITATE DC TO SNF WHEN MEDICALLY STABLE FOR DC.

## 2013-02-21 NOTE — H&P (Signed)
[facility] History and Physical  [name] JYN:[id number] DOB: [birth year]    PCP:   Irena Reichmann, DO   Chief Complaint: syncope.  HPI: [name] is an 67 y.o. male with hx of frequent fall, sleep apnea, afib on chronic Xarelto, hx of CAD, anxiety, depression, HTN, fell about 3 weeks ago requiring surgery of his right leg for TibFib Fx, was at his follow up visit and suddenly syncopized.  He had no palpitation, chest pain, shortness of breath, or any warning.  He was not confused when regaining consciousness.  He was seen at [facility] ER, where work up included normal WBC, Hb of 12.4, normal renal fx tests, with Na 133, CBG 117, negative initial cardiac markers with EKG showing ST at 105, normal ST-T segments and normal intervals.  His head CT was negative as well.  D-Dimer was greater than 5000, resulting in a CTPA which was negative for PE.  His UA was negative, and his LFTS were normal.  His CXR was clear and his TSH was normal.  Orthopedics requested him transferred here since he was just having surgery done here.  When I saw him in his room, he was asymptomatic.  He is admitted for syncope work up.  Rewiew of Systems:  Constitutional: Negative for malaise, fever and chills. No significant weight loss or weight gain Eyes: Negative for eye pain, redness and discharge, diplopia, visual changes, or flashes of light. ENMT: Negative for ear pain, hoarseness, nasal congestion, sinus pressure and sore throat. No headaches; tinnitus, drooling, or problem swallowing. Cardiovascular: Negative for chest pain,  diaphoresis, dyspnea and peripheral edema. ; No orthopnea, PND Respiratory: Negative for cough, hemoptysis, wheezing and stridor. No pleuritic chestpain. Gastrointestinal: Negative for nausea, vomiting, diarrhea, constipation, abdominal pain, melena, blood in stool, hematemesis, jaundice and rectal bleeding.    Genitourinary: Negative for frequency, dysuria, incontinence,flank  pain and hematuria; Musculoskeletal: Negative for back pain and neck pain. Negative for swelling and trauma.;  Skin: . Negative for pruritus, rash, abrasions, bruising and skin lesion.; ulcerations Neuro: Negative for headache, lightheadedness and neck stiffness. Negative for weakness, altered level of consciousness , altered mental status, extremity weakness, burning feet, involuntary movement, seizure and syncope.  Psych: negative for anxiety, depression, insomnia, tearfulness, panic attacks, hallucinations, paranoia, suicidal or homicidal ideation    Past Medical History  Diagnosis Date  . Sleep apnea     cpap machine  . Acid reflux   . A-fib   . Thyroid disease   . Low testosterone, possible hypogonadism 10/01/2012  . Lumbago   . High cholesterol   . Anxiety   . Depression   . CAD (coronary artery disease)   . Hypertension     patient states he does not have high blood pressure  . Arthritis     Past Surgical History  Procedure Laterality Date  . Knee surgery  2012  . Appendectomy    . Right femur surgery  05/2012  . Orif wrist fracture  10/02/2012    Procedure: OPEN REDUCTION INTERNAL FIXATION (ORIF) WRIST FRACTURE;  Surgeon: Dominica Severin, MD;  Location: [facility] ORS;  Service: Orthopedics;  Laterality: Right;  WITH   ANTIBIOTIC  CEMENT  . Total hip revision  10/05/2012    Procedure: TOTAL HIP REVISION;  Surgeon: Shelda Pal, MD;  Location: [facility] ORS;  Service: Orthopedics;  Laterality: Right;  RIGHT TOTAL HIP REVISION  . Hardware removal  10/05/2012    Procedure: HARDWARE REMOVAL;  Surgeon: Shelda Pal, MD;  Location: [facility] ORS;  Service: Orthopedics;  Laterality: Right;  REMOVING  STRYKER  GAMMA NAIL  . Wrist fracture surgery  05/2012  . Orif tibia fracture Right 02/06/2013    Procedure: OPEN REDUCTION INTERNAL FIXATION (ORIF) TIBIA FRACTURE WITH IM ROD FIBULA;  Surgeon: Budd Palmer, MD;  Location: [facility] OR;  Service: Orthopedics;  Laterality: Right;    Medications:  HOME  MEDS: Prior to Admission medications   Medication Sig Start Date End Date Taking? Authorizing Provider  amiodarone (PACERONE) 200 MG tablet Take 100 mg by mouth 2 (two) times daily.    Historical Provider, MD  Ascorbic Acid (VITAMIN C PO) Take 1 tablet by mouth daily.    Historical Provider, MD  atorvastatin (LIPITOR) 80 MG tablet Take 80 mg by mouth at bedtime.     Historical Provider, MD  Cholecalciferol (VITAMIN D) 2000 UNITS CAPS Take 1 capsule by mouth daily.    Historical Provider, MD  clonazePAM (KLONOPIN) 2 MG tablet Take 1 tablet (2 mg total) by mouth 3 (three) times daily. 02/08/13   Tiffany L Reed, DO  clorazepate (TRANXENE-T) 15 MG tablet Take 1 tablet (15 mg total) by mouth daily as needed for anxiety. 10/10/12   Laveda Norman, MD  diltiazem (DILACOR XR) 240 MG 24 hr capsule Take 240 mg by mouth daily.    Historical Provider, MD  docusate sodium 100 MG CAPS Take 100 mg by mouth 2 (two) times daily as needed for constipation. 02/08/13   Mearl Latin, PA-C  doxycycline (MONODOX) 100 MG capsule Take 100 mg by mouth daily.    Historical Provider, MD  DULoxetine (CYMBALTA) 60 MG capsule Take 60 mg by mouth daily.    Historical Provider, MD  etanercept (ENBREL) 50 MG/ML injection Inject 50 mg into the skin once a week. Takes on Mondays    Historical Provider, MD  levothyroxine (SYNTHROID, LEVOTHROID) 125 MCG tablet Take 2 tablets (250 mcg total) by mouth daily before breakfast. 02/08/13   Mearl Latin, PA-C  loratadine (CLARITIN) 10 MG tablet Take 10 mg by mouth daily.    Historical Provider, MD  methocarbamol (ROBAXIN) 500 MG tablet Take 1-2 tablets (500-1,000 mg total) by mouth every 6 (six) hours as needed. 02/08/13   Mearl Latin, PA-C  metoprolol tartrate (LOPRESSOR) 25 MG tablet Take 25 mg by mouth daily as needed. For increased heart rate    Historical Provider, MD  Multiple Vitamin (MULTIVITAMIN WITH MINERALS) TABS Take 1 tablet by mouth daily.    Historical Provider, MD  nitrofurantoin,  macrocrystal-monohydrate, (MACROBID) 100 MG capsule Take 1 capsule (100 mg total) by mouth every 12 (twelve) hours. 02/09/13   Mearl Latin, PA-C  nitroGLYCERIN (NITROSTAT) 0.4 MG SL tablet Place 0.4 mg under the tongue every 5 (five) minutes as needed for chest pain.    Historical Provider, MD  omeprazole (PRILOSEC) 20 MG capsule Take 20 mg by mouth daily.    Historical Provider, MD  oxyCODONE (OXY IR/ROXICODONE) 5 MG immediate release tablet Take 1-2 tablets (5-10 mg total) by mouth every 4 (four) hours as needed for pain. 02/08/13   Mearl Latin, PA-C  oxyCODONE-acetaminophen (PERCOCET/ROXICET) 5-325 MG per tablet Take 1-2 tablets by mouth every 6 (six) hours as needed for pain. 02/08/13   Mearl Latin, PA-C  polyethylene glycol [facility] Hal) packet Take 17 g by mouth daily. 10/10/12   Genelle Gather Babish, PA-C  Probiotic Product (PROBIOTIC PO) Take 1 tablet by mouth daily.  Historical Provider, MD  ranolazine (RANEXA) 500 MG 12 hr tablet Take 500 mg by mouth 2 (two) times daily.    Historical Provider, MD  Rivaroxaban (XARELTO) 20 MG TABS Take 20 mg by mouth daily with supper.    Historical Provider, MD  sulfamethoxazole-trimethoprim (BACTRIM DS) 800-160 MG per tablet TAKE 1 TABLET BY MOUTH TWICE A DAY 01/29/13   Cliffton Asters, MD  Tamsulosin HCl (FLOMAX) 0.4 MG CAPS Take 0.4 mg by mouth daily.    Historical Provider, MD  testosterone cypionate (DEPOTESTOTERONE CYPIONATE) 200 MG/ML injection Inject 200 mg into the muscle every 14 (fourteen) days.    Historical Provider, MD     Allergies:  No Known Allergies  Social History:   reports that he quit smoking about 8 months ago. He does not have any smokeless tobacco history on file. He reports that  drinks alcohol. He reports that he does not use illicit drugs.  Family History: Family History  Problem Relation Age of Onset  . CAD Father 50  . Arthritis Mother      Physical Exam: Filed Vitals:   02/21/13 0010  BP: 131/68  Pulse: 97   Temp: 97.9 F (36.6 C)  TempSrc: Oral  Resp: 18  Height: 6\' 4"  (1.93 m)  Weight: 138.8 kg (306 lb)  SpO2: 98%   Blood pressure 131/68, pulse 97, temperature 97.9 F (36.6 C), temperature source Oral, resp. rate 18, height 6\' 4"  (1.93 m), weight 138.8 kg (306 lb), SpO2 98.00%.  GEN:  Pleasant  patient lying in the stretcher in no acute distress; cooperative with exam. PSYCH:  alert and oriented x4; does not appear anxious or depressed; affect is appropriate. HEENT: Mucous membranes pink and anicteric; PERRLA; EOM intact; no cervical lymphadenopathy nor thyromegaly or carotid bruit; no JVD; There were no stridor. Neck is very supple. Breasts:: Not examined CHEST WALL: No tenderness CHEST: Normal respiration, clear to auscultation bilaterally.  HEART: Regular rate and rhythm.  There are no murmur, rub, or gallops.   BACK: No kyphosis or scoliosis; no CVA tenderness ABDOMEN: soft and non-tender; no masses, no organomegaly, normal abdominal bowel sounds; no pannus; no intertriginous candida. There is no rebound and no distention. Rectal Exam: Not done EXTREMITIES:  He has dressing on his distal right leg.  He uses a walking cast.  Genitalia: not examined PULSES: 2+ and symmetric SKIN: Normal hydration no rash or ulceration CNS: Cranial nerves 2-12 grossly intact no focal lateralizing neurologic deficit.  Speech is fluent; uvula elevated with phonation, facial symmetry and tongue midline. DTR are normal bilaterally, cerebella exam is intact, barbinski is negative and strengths are equaled bilaterally.  No sensory loss.   Labs on Admission: As above from [facility].  EKG: Independently reviewed. ST without any acute ST-T changes.   Assessment/Plan Present on Admission:  . Vitamin D deficiency . Hypothyroidism . GERD (gastroesophageal reflux disease) . Depression . Atrial fibrillation . Dyslipidemia . PAF (paroxysmal atrial fibrillation)  PLAN:  He had rather a complete  work up for syncope at [place] to include serology, CT head and CTPA which are all negative.  I suspect no cause will be found which is not unusual for syncope work up.  The main thing was PE, which was r/out by CTPA.  He denied pain, palpitation, orthostatic symptoms prior to his syncope.  He is not on diuretics, not dehydrated, and is on no BP medication.  I will cycle his troponin and obtain a cardiac ECHO.  I have continued his  meds including his anticoagulation for afib.  Perhaps his syncope was vasovagal.  He is stable, full code, and will be admitted to [facility] service.  Thank you for allowing me to partake in the care of your nice patient.  Other plans as per orders.  Code Status: FULL Unk Lightning, MD. [facility] Pager [phone] 7pm to 7am.  02/21/2013, 4:41 AM

## 2013-02-21 NOTE — Progress Notes (Signed)
Utilization Review Completed.[name] T5/14/2014  

## 2013-02-22 DIAGNOSIS — I517 Cardiomegaly: Secondary | ICD-10-CM

## 2013-02-22 DIAGNOSIS — D649 Anemia, unspecified: Secondary | ICD-10-CM

## 2013-02-22 LAB — TROPONIN I: Troponin I: <0.3 ng/mL (ref <0.30)

## 2013-02-22 MED ORDER — AMIODARONE HCL 100 MG PO TABS
100.0000 mg | ORAL_TABLET | Freq: Every day | ORAL | Status: DC
  Administered 2013-02-23 – 2013-02-24 (×2): 100 mg via ORAL
  Filled 2013-02-22 (×2): qty 1

## 2013-02-22 NOTE — Progress Notes (Signed)
[facility] PROGRESS NOTE  [name] ZOX:[id number] DOB: 1945/12/21 DOA: 02/21/2013 PCP: Irena Reichmann, DO  Assessment/Plan: 1. Syncope: unclear etiology, ? Vasovagal. Telemetry only show a fib rate controlled. Orthostatic vital negative. Troponin negative. Carotid doppler no significant stenosis. ECHO pending. Needs to follow up with primary cardiologist for Holter monitor.  2. A fib: continue with current regimen.  3. Closed R distal 1/3 tibial shaft fracture: I informed Dr handy of admission.    Code Status: full Family Communication: care discussed with patient Disposition Plan: waiting for SW to help with placement.   Consultants:  none  Procedures:  none  Antibiotics:  none  HPI/Subjective: feeling better. No lightheadness  Objective: Filed Vitals:   02/21/13 2126 02/22/13 0628 02/22/13 1206 02/22/13 1210  BP: 127/69 122/71 138/81 138/81  Pulse: 85 85 101 101  Temp: 98.2 F (36.8 C) 97.3 F (36.3 C)    TempSrc: Oral     Resp: 18 18    Height:      Weight:      SpO2: 98% 96%      Intake/Output Summary (Last 24 hours) at 02/22/13 1337 Last data filed at 02/22/13 0900  Gross per 24 hour  Intake    480 ml  Output   1700 ml  Net  -1220 ml   Filed Weights   02/21/13 0010  Weight: 138.8 kg (306 lb)    Exam:   General:  No distress  Cardiovascular: S 1 S 2 IRR  Respiratory: CTA  Abdomen: BS present, soft, nt  Musculoskeletal: no edema  Data Reviewed: Basic Metabolic Panel: No results found for this basename: NA, K, CL, CO2, GLUCOSE, BUN, CREATININE, CALCIUM, MG, PHOS,  in the last 168 hours Liver Function Tests: No results found for this basename: AST, ALT, ALKPHOS, BILITOT, PROT, ALBUMIN,  in the last 168 hours No results found for this basename: LIPASE, AMYLASE,  in the last 168 hours No results found for this basename: AMMONIA,  in the last 168 hours CBC: No results found for this basename: WBC, NEUTROABS, HGB, HCT, MCV, PLT,   in the last 168 hours Cardiac Enzymes:  Recent Labs Lab 02/21/13 1240 02/21/13 2146 02/22/13 0455 02/22/13 1009  TROPONINI <0.30  <0.30 <0.30 <0.30 <0.30   BNP (last 3 results) No results found for this basename: PROBNP,  in the last 8760 hours CBG: No results found for this basename: GLUCAP,  in the last 168 hours  No results found for this or any previous visit (from the past 240 hour(s)).   Studies: No results found.  Scheduled Meds: . acidophilus  1 capsule Oral Daily  . amiodarone  100 mg Oral BID  . atorvastatin  80 mg Oral QHS  . cholecalciferol  2,000 Units Oral Daily  . clonazePAM  2 mg Oral TID  . diltiazem  240 mg Oral Daily  . doxycycline  100 mg Oral Daily  . DULoxetine  60 mg Oral Daily  . levothyroxine  250 mcg Oral QAC breakfast  . loratadine  10 mg Oral Daily  . metoprolol tartrate  25 mg Oral BID  . pantoprazole  40 mg Oral Q1200  . polyethylene glycol  17 g Oral Daily  . ranolazine  500 mg Oral BID  . Rivaroxaban  20 mg Oral Q supper  . sodium chloride  3 mL Intravenous Q12H  . sodium chloride  3 mL Intravenous Q12H  . sulfamethoxazole-trimethoprim  1 tablet Oral Daily  . tamsulosin  0.4 mg Oral  Daily  . testosterone cypionate  200 mg Intramuscular Q14 Days  . vitamin C  500 mg Oral Daily   Continuous Infusions:   Principal Problem:   Syncope Active Problems:   Atrial fibrillation   Hypothyroidism   Dyslipidemia   Vitamin D deficiency   GERD (gastroesophageal reflux disease)   Depression   Right tibial fracture   Fall   PAF (paroxysmal atrial fibrillation)   Chronic anticoagulation    Time spent: 35 minutes    [name]  [facility] Pager [phone] If 7PM-7AM, please contact night-coverage at [email or web address], password [facility] 02/22/2013, 1:37 PM  LOS: 1 day

## 2013-02-22 NOTE — Evaluation (Signed)
Physical Therapy Evaluation Patient Details Name: [name] MRN: [id number] DOB: June 18, 1946 Today's Date: 02/22/2013 Time: 7829-5621 PT Time Calculation (min): 14 min  PT Assessment / Plan / Recommendation Clinical Impression  Pt admit for syncope.  Pt with multiple admissions lately with multiple fractures.  NWB right hemibody and has a back brace.  Pt with deficits in balance and ambulation progress as well as safety.  Will benefit from NHP with therapy to progress mobility.      PT Assessment  Patient needs continued PT services    Follow Up Recommendations  SNF;Supervision/Assistance - 24 hour        Barriers to Discharge Decreased caregiver support partner having difficulty assisting pt at home per pt    Equipment Recommendations  None recommended by PT       Frequency Min 3X/week    Precautions / Restrictions Precautions Precautions: Fall Precaution Comments: Pt verbalized understanding of NWB status on R LE and R wrist. Required Braces or Orthoses: Spinal Brace Spinal Brace: Thoracolumbosacral orthotic;Applied in supine position Restrictions Weight Bearing Restrictions: Yes RUE Weight Bearing: Non weight bearing RLE Weight Bearing: Non weight bearing   Pertinent Vitals/Pain VSS, No pain      Mobility  Bed Mobility Bed Mobility: Rolling Left;Left Sidelying to Sit Rolling Left: 7: Independent Left Sidelying to Sit: 7: Independent Supine to Sit: Not tested (comment) Sitting - Scoot to Edge of Bed: 7: Independent Details for Bed Mobility Assistance: No difficulty getting to EOB Transfers Details for Transfer Assistance: Pt reports he is independent with his transfers to wheelchair.  Reports he still needs assist ambulating with PFRW.   Ambulation/Gait Ambulation/Gait Assistance: Not tested (comment) Stairs: No Wheelchair Mobility Wheelchair Mobility: No         PT Diagnosis: Generalized weakness  PT Problem List: Decreased strength;Decreased range  of motion;Decreased activity tolerance;Decreased balance;Decreased mobility;Decreased knowledge of use of DME;Decreased safety awareness;Decreased knowledge of precautions;Pain PT Treatment Interventions: DME instruction;Gait training;Functional mobility training;Therapeutic activities;Therapeutic exercise;Balance training;Patient/family education;Wheelchair mobility training   PT Goals Acute Rehab PT Goals PT Goal Formulation: With patient Time For Goal Achievement: 03/01/13 Potential to Achieve Goals: Good PT Goal: Rolling Supine to Left Side - Progress: Discontinued (comment) PT Goal: Supine/Side to Sit - Progress: Discontinued (comment) PT Goal: Sit to Supine/Side - Progress: Discontinued (comment) Pt will go Sit to Stand: Independently PT Goal: Sit to Stand - Progress: Goal set today Pt will go Stand to Sit: Independently PT Goal: Stand to Sit - Progress: Goal set today Pt will Transfer Bed to Chair/Chair to Bed: Independently PT Transfer Goal: Bed to Chair/Chair to Bed - Progress: Goal set today Pt will Ambulate: 1 - 15 feet;with min assist;with least restrictive assistive device PT Goal: Ambulate - Progress: Goal set today Pt will Propel Wheelchair: 51 - 150 feet;with modified independence PT Goal: Propel Wheelchair - Progress: Goal set today  Visit Information  Last PT Received On: 02/22/13 Assistance Needed: +1    Subjective Data  Subjective: "I am going to a Rehab facility as my partner cannot keep doing this." Patient Stated Goal: go to Rehab for therapy   Prior Functioning  Home Living Lives With: Significant other Available Help at Discharge: Skilled Nursing Facility Type of Home: House Home Access: Level entry Home Layout: One level Bathroom Shower/Tub: Tub/shower unit;Walk-in shower Bathroom Toilet: Handicapped height Bathroom Accessibility: Yes How Accessible: Accessible via walker Home Adaptive Equipment: Walker - rolling;Tub transfer bench;Bedside  commode/3-in-1;Wheelchair - manual Prior Function Level of Independence: Needs assistance Needs  Assistance: Bathing;Meal Prep Bath: Minimal Meal Prep: Supervision/set-up Able to Take Stairs?: No Driving: No Communication Communication: No difficulties Dominant Hand: Left    Cognition  Cognition Arousal/Alertness: Awake/alert Behavior During Therapy: WFL for tasks assessed/performed Overall Cognitive Status: Within Functional Limits for tasks assessed    Extremity/Trunk Assessment Right Lower Extremity Assessment RLE ROM/Strength/Tone: Deficits;Unable to fully assess;Due to pain;Due to precautions RLE ROM/Strength/Tone Deficits: unable to assess R DF/PF secondary to surgery and precautions; pt able to perform LAQ in R LE demo tremors RLE Sensation: Deficits RLE Sensation Deficits: diminished to light touch vs. L LE Left Lower Extremity Assessment LLE ROM/Strength/Tone: WFL for tasks assessed LLE Sensation: WFL - Light Touch Trunk Assessment Trunk Assessment: Normal   Balance Static Sitting Balance Static Sitting - Balance Support: Left upper extremity supported;Feet supported Static Sitting - Level of Assistance: 5: Stand by assistance Static Sitting - Comment/# of Minutes: 2  End of Session PT - End of Session Activity Tolerance: Patient tolerated treatment well Patient left: in bed;with call bell/phone within reach;with family/visitor present Nurse Communication: Mobility status  GP Functional Assessment Tool Used: Clinical judgment Functional Limitation: Mobility: Walking and moving around Mobility: Walking and Moving Around Current Status (U0454): At least 1 percent but less than 20 percent impaired, limited or restricted Mobility: Walking and Moving Around Goal Status [phone]): 0 percent impaired, limited or restricted   INGOLD,Jonn Chaikin 02/22/2013, 12:48 PM  [facility] [phone] (pager)

## 2013-02-22 NOTE — Progress Notes (Signed)
  Echocardiogram 2D Echocardiogram has been performed.  Georgian Co 02/22/2013, 4:47 PM

## 2013-02-23 ENCOUNTER — Observation Stay (HOSPITAL_COMMUNITY): Payer: Medicare Other

## 2013-02-23 DIAGNOSIS — I2510 Atherosclerotic heart disease of native coronary artery without angina pectoris: Secondary | ICD-10-CM

## 2013-02-23 NOTE — Clinical Social Work Psychosocial (Signed)
     Clinical Social Work Department BRIEF PSYCHOSOCIAL ASSESSMENT 02/23/2013  Patient:  [name], [name]     Account Number:  [id number]     Admit date:  02/21/2013  Clinical Social Worker:  Hulan Fray  Date/Time:  02/23/2013 08:11 AM  Referred by:  Physician  Date Referred:  02/22/2013 Referred for  SNF Placement   Other Referral:   Interview type:  Patient Other interview type:   [name]- significant other    PSYCHOSOCIAL DATA Living Status:  SIGNIFICANT OTHER Admitted from facility:   Level of care:   Primary support name:  [name] Primary support relationship to patient:  SPOUSE Degree of support available:   supportive    CURRENT CONCERNS Current Concerns  Post-Acute Placement   Other Concerns:    SOCIAL WORK ASSESSMENT / PLAN Clinical Social Worker received referral for SNF placement at discharge. CSW spoke with patient and his significant other at bedside regarding discharge plans. Significant other expressed interest in possible custodial care in a SNF due his belief that patient cannot participate in physical therapy. Significant other did inform CSW that they are aware of a possibility of private paying and are agreeable. Significant other also reported that he would be interested in semi-private rooms as well.Significant other was agreeable for CSW to initiate search in [employer].     CSW completed FL2 for MD's signature and will update patient and significant other when offers are made.   Assessment/plan status:  Psychosocial Support/Ongoing Assessment of Needs Other assessment/ plan:   Information/referral to community resources:   SNF packet    PATIENTS/FAMILYS RESPONSE TO PLAN OF CARE: Patient and significant other expressed interest in SNF placement but more for custodial care. Both were appreciative of CSW's visit and assistance.

## 2013-02-23 NOTE — Progress Notes (Signed)
[facility] PROGRESS NOTE  [name] WUJ:[id number] DOB: [birth year] DOA: 02/21/2013 PCP: Irena Reichmann, DO  Assessment/Plan: 1. Syncope: unclear etiology, ? Vasovagal. Telemetry only show a fib rate controlled. Orthostatic vital negative. Troponin negative. Carotid doppler no significant stenosis. ECHO EF 55 %, LVH . Needs to follow up with primary cardiologist for Holter monitor.  2. A fib: continue with current regimen.  3. Closed R distal 1/3 tibial shaft fracture: I informed Dr handy of admission.  4. L 1 Compression fracture:  Dr Shon Baton following. If plan for surgery patient will likely need cardiologist clearance. Dr Shon Baton getting information for metabolic bone work up.    Code Status: full Family Communication: care discussed with patient. Disposition Plan: waiting for SW to help with placement.   Consultants:  none  Procedures:  none  Antibiotics:  none  HPI/Subjective: feeling better. No lightheadness  Objective: Filed Vitals:   02/22/13 1345 02/22/13 2158 02/23/13 0609 02/23/13 1040  BP: 106/61 110/57 122/59 116/59  Pulse: 94 85 88 106  Temp: 97.5 F (36.4 C) 97.5 F (36.4 C) 97.8 F (36.6 C)   TempSrc: Oral Oral Oral   Resp: 18 18 18    Height:      Weight:      SpO2: 99% 100% 98%     Intake/Output Summary (Last 24 hours) at 02/23/13 1758 Last data filed at 02/23/13 1642  Gross per 24 hour  Intake    360 ml  Output   1200 ml  Net   -840 ml   Filed Weights   02/21/13 0010  Weight: 138.8 kg (306 lb)    Exam:   General:  No distress  Cardiovascular: S 1 S 2 IRR  Respiratory: CTA  Abdomen: BS present, soft, nt  Musculoskeletal: no edema  Data Reviewed: Basic Metabolic Panel: No results found for this basename: NA, K, CL, CO2, GLUCOSE, BUN, CREATININE, CALCIUM, MG, PHOS,  in the last 168 hours Liver Function Tests: No results found for this basename: AST, ALT, ALKPHOS, BILITOT, PROT, ALBUMIN,  in the last 168 hours No  results found for this basename: LIPASE, AMYLASE,  in the last 168 hours No results found for this basename: AMMONIA,  in the last 168 hours CBC: No results found for this basename: WBC, NEUTROABS, HGB, HCT, MCV, PLT,  in the last 168 hours Cardiac Enzymes:  Recent Labs Lab 02/21/13 1240 02/21/13 2146 02/22/13 0455 02/22/13 1009  TROPONINI <0.30  <0.30 <0.30 <0.30 <0.30   BNP (last 3 results) No results found for this basename: PROBNP,  in the last 8760 hours CBG: No results found for this basename: GLUCAP,  in the last 168 hours  No results found for this or any previous visit (from the past 240 hour(s)).   Studies: Dg Lumbar Spine 2-3 Views  02/23/2013   *RADIOLOGY REPORT*  Clinical Data: Fracture.  LUMBAR SPINE - 2-3 VIEW  Comparison: 02/08/2013.  Findings: Request for standing views.  The patient was unable to put weight on foot.  Exam done in sitting position.  Evaluation limited by patient's habitus.  Prominent anterior wedge compression fracture of the L1 vertebral body (90% loss of height) with retropulsion and kyphosis centered at this level.  Limited evaluation of the surrounding vertebra.  The degree of kyphosis appears more prominent than on the CT scan.  Vascular calcifications.  IMPRESSION: Request for standing views.  The patient was unable to put weight on foot.  Exam done in sitting position.  Evaluation limited by patient's  habitus.  Prominent anterior wedge compression fracture of the L1 vertebral body (90% loss of height) with retropulsion and kyphosis centered at this level.  Limited evaluation of the surrounding vertebra.  The degree of kyphosis appears more prominent than on the CT scan.   Original Report Authenticated By: Lacy Duverney, M.D.    Scheduled Meds: . acidophilus  1 capsule Oral Daily  . amiodarone  100 mg Oral Daily  . atorvastatin  80 mg Oral QHS  . cholecalciferol  2,000 Units Oral Daily  . clonazePAM  2 mg Oral TID  . diltiazem  240 mg Oral Daily   . doxycycline  100 mg Oral Daily  . DULoxetine  60 mg Oral Daily  . levothyroxine  250 mcg Oral QAC breakfast  . loratadine  10 mg Oral Daily  . metoprolol tartrate  25 mg Oral BID  . pantoprazole  40 mg Oral Q1200  . polyethylene glycol  17 g Oral Daily  . ranolazine  500 mg Oral BID  . Rivaroxaban  20 mg Oral Q supper  . sodium chloride  3 mL Intravenous Q12H  . sodium chloride  3 mL Intravenous Q12H  . sulfamethoxazole-trimethoprim  1 tablet Oral Daily  . tamsulosin  0.4 mg Oral Daily  . testosterone cypionate  200 mg Intramuscular Q14 Days  . vitamin C  500 mg Oral Daily   Continuous Infusions:   Principal Problem:   Syncope Active Problems:   Atrial fibrillation   Hypothyroidism   Dyslipidemia   Vitamin D deficiency   GERD (gastroesophageal reflux disease)   Depression   Right tibial fracture   Fall   PAF (paroxysmal atrial fibrillation)   Chronic anticoagulation    Time spent: 25 minutes    Brady Schiller  [facility] Pager [phone] If 7PM-7AM, please contact night-coverage at [email or web address], password [facility] 02/23/2013, 5:58 PM  LOS: 2 days

## 2013-02-23 NOTE — Progress Notes (Signed)
Physical Therapy Treatment Patient Details Name: [name] MRN: [id number] DOB: [birth year] Today's Date: 02/23/2013 Time: 4098-1191 PT Time Calculation (min): 25 min  PT Assessment / Plan / Recommendation Comments on Treatment Session  Pt well aware of mobility and precautions as he has been dealing with orthopedic injuries.  He was appreciative of ideas for core activation exercises he can do on his own during his recovery    Follow Up Recommendations        Does the patient have the potential to tolerate intense rehabilitation     Barriers to Discharge        Equipment Recommendations       Recommendations for Other Services    Frequency     Plan Discharge plan remains appropriate;Frequency remains appropriate    Precautions / Restrictions Precautions Precautions: Fall Precaution Comments: Pt verbalized understanding of NWB status on R LE and R wrist. Required Braces or Orthoses: Spinal Brace Spinal Brace: Thoracolumbosacral orthotic;Applied in supine position Restrictions Weight Bearing Restrictions: Yes RUE Weight Bearing: Non weight bearing RLE Weight Bearing: Non weight bearing   Pertinent Vitals/Pain Pt with occasional pain in back with exercise:exercise stopped at that time    Mobility  Bed Mobility Details for Bed Mobility Assistance: did not perform bed mobility. Pt described the mobility he had undergone during xray this morning.  Transfers Transfers: Not assessed Ambulation/Gait Ambulation/Gait Assistance: Not tested (comment)    Exercises General Exercises - Lower Extremity Quad Sets: AROM;Both;5 reps;Supine Gluteal Sets: AROM;Both;5 reps;Supine Short Arc Quad: AROM;Both;5 reps;Supine Hip Flexion/Marching: AROM;Right;5 reps;Supine Other Exercises Other Exercises: deep breathing Other Exercises: one legged bridge with left leg to activate glutes Other Exercises: all exercise done within pain limit and stopped whenever pt began to c/o pain  discomfort Other Exercises: pt issued wriitten home program of beginning core and LE strengthening... copy placed in shadow chart   PT Diagnosis:    PT Problem List:   PT Treatment Interventions:     PT Goals Acute Rehab PT Goals PT Goal Formulation: With patient Time For Goal Achievement: 03/01/13 Potential to Achieve Goals: Good Pt will Perform Home Exercise Program: Independently PT Goal: Perform Home Exercise Program - Progress: Progressing toward goal  Visit Information  Last PT Received On: 02/23/13    Subjective Data  Subjective: Pt very verbal about his situation and past history of injuries Patient Stated Goal: to go to SNF at private pay status as he has used up all his medicare days until he is able to bear weight on leg   Cognition  Cognition Arousal/Alertness: Awake/alert Behavior During Therapy: WFL for tasks assessed/performed Overall Cognitive Status: Within Functional Limits for tasks assessed    Balance     End of Session PT - End of Session Activity Tolerance: Patient limited by pain Patient left: in bed   GP    Rosey Bath K. Gardi, [place] [phone] 02/23/2013, 4:43 PM

## 2013-02-23 NOTE — Consult Note (Signed)
Irena Reichmann, DO Chief Complaint: Syncopal event History: [name] is an 67 y.o. male with hx of frequent fall, sleep apnea, afib on chronic Xarelto, hx of CAD, anxiety, depression, HTN, fell about 3 weeks ago requiring surgery of his right leg for TibFib Fx, was at his follow up visit and suddenly syncopized. He had no palpitation, chest pain, shortness of breath, or any warning. He was not confused when regaining consciousness. He was seen at [facility], where work up included normal WBC, Hb of 12.4, normal renal fx tests, with Na 133, CBG 117, negative initial cardiac markers with EKG showing ST at 105, normal ST-T segments and normal intervals. His head CT was negative as well. D-Dimer was greater than 5000, resulting in a CTPA which was negative for PE. His UA was negative, and his LFTS were normal. His CXR was clear and his TSH was normal. Orthopedics requested him transferred here since he was just having surgery done here. When I saw him in his room, he was asymptomatic. He is admitted for syncope work up.  Past Medical History  Diagnosis Date  . Sleep apnea     cpap machine  . Acid reflux   . A-fib   . Thyroid disease   . Low testosterone, possible hypogonadism 10/01/2012  . Lumbago   . High cholesterol   . Anxiety   . Depression   . CAD (coronary artery disease)   . Hypertension     patient states he does not have high blood pressure  . Arthritis     No Known Allergies  No current facility-administered medications on file prior to encounter.   Current Outpatient Prescriptions on File Prior to Encounter  Medication Sig Dispense Refill  . Ascorbic Acid (VITAMIN C PO) Take 1 tablet by mouth daily.      [name] atorvastatin (LIPITOR) 80 MG tablet Take 80 mg by mouth at bedtime.       . Cholecalciferol (VITAMIN D) 2000 UNITS CAPS Take 1 capsule by mouth daily.      . clonazePAM (KLONOPIN) 2 MG tablet Take 1 tablet (2 mg total) by mouth 3 (three) times daily.  90 tablet  0   . clorazepate (TRANXENE-T) 15 MG tablet Take 1 tablet (15 mg total) by mouth daily as needed for anxiety.  30 tablet  0  . diltiazem (DILACOR XR) 240 MG 24 hr capsule Take 240 mg by mouth daily.      [name] etanercept (ENBREL) 50 MG/ML injection Inject 50 mg into the skin once a week. Takes on Mondays      . levothyroxine (SYNTHROID, LEVOTHROID) 125 MCG tablet Take 2 tablets (250 mcg total) by mouth daily before breakfast.      . loratadine (CLARITIN) 10 MG tablet Take 10 mg by mouth daily.      . methocarbamol (ROBAXIN) 500 MG tablet Take 1-2 tablets (500-1,000 mg total) by mouth every 6 (six) hours as needed.  60 tablet  0  . metoprolol tartrate (LOPRESSOR) 25 MG tablet Take 25 mg by mouth daily as needed. For increased heart rate      . Multiple Vitamin (MULTIVITAMIN WITH MINERALS) TABS Take 1 tablet by mouth daily.      . nitroGLYCERIN (NITROSTAT) 0.4 MG SL tablet Place 0.4 mg under the tongue every 5 (five) minutes as needed for chest pain.      [name] omeprazole (PRILOSEC) 20 MG capsule Take 20 mg by mouth daily.      [name] oxyCODONE (OXY  IR/ROXICODONE) 5 MG immediate release tablet Take 1-2 tablets (5-10 mg total) by mouth every 4 (four) hours as needed for pain.  60 tablet  0  . oxyCODONE-acetaminophen (PERCOCET/ROXICET) 5-325 MG per tablet Take 1-2 tablets by mouth every 6 (six) hours as needed for pain.  60 tablet  0  . Probiotic Product (PROBIOTIC PO) Take 1 tablet by mouth daily.      . ranolazine (RANEXA) 500 MG 12 hr tablet Take 500 mg by mouth 2 (two) times daily.      . Rivaroxaban (XARELTO) 20 MG TABS Take 20 mg by mouth daily with supper.      . Tamsulosin HCl (FLOMAX) 0.4 MG CAPS Take 0.4 mg by mouth daily.      [name] testosterone cypionate (DEPOTESTOTERONE CYPIONATE) 200 MG/ML injection Inject 200 mg into the muscle every 14 (fourteen) days.        Physical Exam: Filed Vitals:   02/23/13 0609  BP: 122/59  Pulse: 88  Temp: 97.8 F (36.6 C)  Resp: 18   He is a pleasant gentleman,  appears  stated age in no acute distress. He is alert. He is oriented x3. He  denies a history of incontinence of bowel and bladder. He denies  perianal or saddle dysesthesia.   He is grossly neurologically intact. EHL, tibialis anterior,  gastrocnemius are intact bilaterally.   He did state that prior to this  injury, he was ambulatory without any issues.   He has no shortness of breath or chest pain. The abdomen is soft and nontender. He has  moderate to significant back pain with deep palpation. No obvious  ecchymosis or bruising.   Intact peripheral pulses throughout. Image: Dg Thoracic Spine 2 View  02/08/2013   *RADIOLOGY REPORT*  Clinical Data: History of pain.  THORACIC SPINE - 2 VIEW  Comparison: 03/04/2011.  06/23/2011.  06/05/2012.  Findings: The vertebral column is well-aligned.  There is slight narrowing of some of the intervertebral disc spaces.  There is very slight scoliosis convexity to the right.  There is no evidence of wedge compression fracture.  Slight concavity of the endplates of the upper mid thoracic vertebral bodies on lateral image may be developmental, or reflect the slight scoliosis, or reflect previous trauma.  I feel these are stable.  Mild degenerative spondylosis changes are seen.  IMPRESSION: Slight narrowing of some degenerative disc spaces.  Mild degenerative spondylosis changes.  No wedge compression fracture. Slight scoliosis.  Stable slight concavity of endplates of upper mid thoracic vertebral bodies.  This could be developmental, reflect scoliosis, reflect previous trauma.  No acute fracture with compression fracture is evident.   Original Report Authenticated By: Onalee Hua Call   Dg Lumbar Spine Complete  02/08/2013   *RADIOLOGY REPORT*  Clinical Data: Low back pain  LUMBAR SPINE - COMPLETE 4+ VIEW  Comparison: 12/01/2012  Findings: Right hip arthroplasty components partially seen.  There is a progressive severe compression fracture deformity of L1 with near  vertebral plana especially anteriorly.  There is resultant kyphosis at the thoracolumbar junction.  Diffuse osteopenia. Moderate aortic calcifications.  IMPRESSION:  Significant progression of L1 compression fracture deformity.   Original Report Authenticated By: D. Andria Rhein, MD   Dg Tibia/fibula Right  02/06/2013   *RADIOLOGY REPORT*  Clinical Data: For fixation.  DG C-ARM 61-120 MIN, RIGHT TIBIA AND FIBULA - 2 VIEW  Comparison: Plain films 02/03/2013.  Findings: We are provided with seven fluoroscopic spot views of the right lower leg.  Images demonstrate  placement of medial plate and screws for fixation of a distal tibial fracture and a retrograde IM nail for fixation of a fibular fracture.  Position and alignment are markedly improved.  No acute finding.  IMPRESSION: ORIF distal tibial and fibular fractures.   Original Report Authenticated By: Holley Dexter, M.D.   X-ray Tibia Fibula Right Ap And Lateral  02/06/2013   *RADIOLOGY REPORT*  Clinical Data: Postop ORIF.  RIGHT TIBIA AND FIBULA - 2 VIEW  Comparison: Intraoperative imaging 02/06/2013  Findings: Plate and screw fixation across the distal tibial fracture.  There is a wire within the mid and distal fibula.  No hardware complicating feature.  Small displaced bone fragment anteriorly on the lateral view, likely from the distal tibia. Changes of right knee replacement noted.  IMPRESSION: Internal fixation with near anatomic alignment.   Original Report Authenticated By: Charlett Nose, M.D.   Dg Ankle Complete Right  02/07/2013   *RADIOLOGY REPORT*  Clinical Data: Status post internal fixation of right ankle fracture.  RIGHT ANKLE - COMPLETE 3+ VIEW  Comparison: February 02, 2013.  Findings: Status post surgical internal fixation of the comminuted distal tibial fracture with improved alignment of fracture components.  Intramedullary rod fixation of comminuted distal fibular fracture is also noted. The joint has been casted and immobilized.   IMPRESSION: Status post internal fixation of distal tibial and fibular fractures.   Original Report Authenticated By: Lupita Raider.,  M.D.   Ct Lumbar Spine Wo Contrast  02/08/2013   *RADIOLOGY REPORT*  Clinical Data: L1 compression fracture.  Fell 3 months ago.  Low back pain.  CT LUMBAR SPINE WITHOUT CONTRAST  Technique:  Multidetector CT imaging of the lumbar spine was performed without intravenous contrast administration. Multiplanar CT image reconstructions were also generated.  Comparison: MRI 02/08/2013  Findings: L1 compression fracture is redemonstrated. This is quite severe, nearly a vertebra plana deformity. This appears to be a benign osteopenic fracture without osseous destructive lesion. Vertebral body height anteriorly measures 6 mm and posteriorly 7 mm.  The vertebrae is too severely compressed to move consider vertebral augmentation with methylmethacrylate.  While the pedicles are not strictly involved with the fracture, there is significant comminution which extends up to the base of the pedicles as seen on image 34 series 3.   There is retropulsion of bone into the canal with severe stenosis; canal AP diameter 4 mm at its most narrow dimension (image 36 series 3). There is severe bilateral foraminal narrowing at L1-2 which affects the exiting L1 nerve roots.  As demonstrated on MR, there is severe spinal stenosis compressing the conus and intraspinal cauda equina nerve roots at the L1 level. There is a second fracture which extends obliquely along the right L1 transverse process; this is not clinically significant.  Other than diffusely osteopenic vertebrae, no other significant abnormalities are seen.  There is no evidence for additional lumbar compression deformities.  Lower lumbar facet arthropathy is present.  There is mild annular bulging L5-S1.  Nonaneurysmal atherosclerotic calcification of the aorta.  No paravertebral masses.  Subcentimeter left renal calcifications, could be vascular  or small nonobstructing calculus.  IMPRESSION: Severe L1 compression deformity, near vertebral plana at 6 mm height.  Significant retropulsion of bony fragment in the canal with severe spinal stenosis; AP diameter 4 mm in its most narrow dimension. Severe bilateral foraminal narrowing L1-L2.   Original Report Authenticated By: Davonna Belling, M.D.   Mr Lumbar Spine Wo Contrast  02/08/2013   *RADIOLOGY REPORT*  Clinical  Data: L1 compression fracture.  Back pain.  MRI LUMBAR SPINE WITHOUT CONTRAST  Technique:  Multiplanar and multiecho pulse sequences of the lumbar spine were obtained without intravenous contrast.  Comparison: Radiographs dated 05/01 and 12/01/2012 and CT scan of the abdomen and pelvis dated 03/10/2011  Findings: There is a severe compression fracture of the L1 vertebral body with protrusion of the posterior aspect of the vertebra into the spinal canal at the level of the tip of the conus.  Bone protrudes approximately 9.5 mm into the spinal canal narrowing the AP dimension to approximately 6 mm.  There is no protrusion of disc material at the level of the fracture although the L1-2 disc does slightly bulge in to the neural foramina bilaterally without neural impingement.  T10-11 and T11-12:  Normal.  L2-3 and L3-4:  Normal.  L4-5:  The disc is normal.  There is hypertrophy of the ligamenta flava and the patient has congenitally short pedicles which narrow the spinal canal and both lateral recesses.  Slight degenerative changes of the facet joints.  L5 S1:  Tiny broad-based disc bulge.  No neural impingement.  IMPRESSION:  1.  Benign-appearing severe compression fracture of L1.  Bone protrudes into the spinal canal and compresses the thecal sac fairly severely at the level of the tip of the conus. 2.  No significant disc protrusions. 3.  Slight spinal stenosis and bilateral lateral recess stenosis at L4-5 due to hypertrophy of the ligamenta flava and facet joints.   Original Report Authenticated By:  Francene Boyers, M.D.   Dg Chest Portable 1 View  02/03/2013   *RADIOLOGY REPORT*  Clinical Data: Preoperative.  No chest complaints.  History of hypertension.  Atrial fibrillation.  Previous smoker.  PORTABLE CHEST - 1 VIEW  Comparison: 06/05/2012  Findings: Shallow inspiration. Borderline heart size and pulmonary vascularity are likely normal for technique.  No focal consolidation.  No blunting of costophrenic angles.  No pneumothorax.  Mediastinal contours appear intact.  Degenerative changes in the shoulders.  No significant change since previous study.  IMPRESSION: Shallow inspiration.  No evidence of active pulmonary disease.   Original Report Authenticated By: Burman Nieves, M.D.   Dg Tibia/fibula Right Port  02/03/2013   *RADIOLOGY REPORT*  Clinical Data: Preoperative evaluation of the distal right tibial and fibular fracture.  PORTABLE RIGHT TIBIA AND FIBULA - 2 VIEW  Comparison: 02/02/2013.  Findings: AP and lateral views of the right tibia and fibula demonstrate comminuted fractures of the distal third of the tibial and fibular diaphyses.  Alignment has slightly improved and is in near anatomic with the overlying splint in place, however, there does appear to be some mild residual anterior angulation and mild impaction.  Proximal two thirds of the tibia and fibula are intact. Status post right total knee arthroplasty.  The femoral and tibial components of the prosthesis both appear well seated without definite periprosthetic fracture.  IMPRESSION: 1.  Comminuted impacted mildly angulated fractures of the distal third of the tibial and fibular diaphyses redemonstrated, with slight improvement in alignment, as above.   Original Report Authenticated By: Trudie Reed, M.D.   Dg C-arm [phone] Min  02/06/2013   *RADIOLOGY REPORT*  Clinical Data: For fixation.  DG C-ARM 61-120 MIN, RIGHT TIBIA AND FIBULA - 2 VIEW  Comparison: Plain films 02/03/2013.  Findings: We are provided with seven fluoroscopic  spot views of the right lower leg.  Images demonstrate placement of medial plate and screws for fixation of a distal tibial fracture and  a retrograde IM nail for fixation of a fibular fracture.  Position and alignment are markedly improved.  No acute finding.  IMPRESSION: ORIF distal tibial and fibular fractures.   Original Report Authenticated By: Holley Dexter, M.D.    A/P:  Patient last seen by me on 5/2 for vertebral fracture.  Returns to hospital with syncopal event with a fall.  Patient continues to complain of back pain but no change from baseline.   Plan: 1. F/U xrays today to eval fx.  2. Will review records to see if endocrine w/o for metabolic bone loss has been completed.  If not  recommends endocrine consult to start that process  3. Continue care and monitor neuro exam.  If any acute changes may need to consider PSFI and   decompression

## 2013-02-23 NOTE — Clinical Social Work Note (Signed)
Clinical Social Worker spoke with patient's significant other regarding bed offers received and has chosen [facility]. CSW will notify facility of choice.   Rozetta Nunnery MSW, [employer] [phone]

## 2013-02-23 NOTE — Clinical Social Work Placement (Signed)
     Clinical Social Work Department CLINICAL SOCIAL WORK PLACEMENT NOTE 02/23/2013  Patient:  [name], [name]  Account Number:  [id number] Admit date:  02/21/2013  Clinical Social Worker:  Rozetta Nunnery, Theresia Majors  Date/time:  02/23/2013 03:50 PM  Clinical Social Work is seeking post-discharge placement for this patient at the following level of care:   SKILLED NURSING   (*CSW will update this form in Epic as items are completed)   02/22/2013  Patient/family provided with [facility] Department of Clinical Social Works list of facilities offering this level of care within the geographic area requested by the patient (or if unable, by the patients family).  02/22/2013  Patient/family informed of their freedom to choose among providers that offer the needed level of care, that participate in Medicare, Medicaid or managed care program needed by the patient, have an available bed and are willing to accept the patient.  02/22/2013  Patient/family informed of [facility] interest [facility], as well as of the fact that they are under no obligation to receive care at this facility.  PASARR submitted to EDS on 02/23/2013 PASARR number received from EDS on 02/23/2013  FL2 transmitted to all facilities in geographic area requested by pt/family on  06/07/2012 FL2 transmitted to all facilities within larger geographic area on 06/07/2012  Patient informed that his/her managed care company has contracts with or will negotiate with  certain facilities, including the following:     Patient/family informed of bed offers received:  02/23/2013 Patient chooses bed at [facility] [place] Physician recommends and patient chooses bed at    Patient to be transferred to  on   Patient to be transferred to facility by   The following physician request were entered in Epic:   Additional Comments:

## 2013-02-24 ENCOUNTER — Encounter (HOSPITAL_COMMUNITY): Payer: Self-pay

## 2013-02-24 MED ORDER — OXYCODONE-ACETAMINOPHEN 5-325 MG PO TABS: 1.0000 | ORAL_TABLET | Freq: Four times a day (QID) | ORAL | Status: DC | PRN

## 2013-02-24 MED ORDER — SULFAMETHOXAZOLE-TMP DS 800-160 MG PO TABS
1.0000 | ORAL_TABLET | Freq: Two times a day (BID) | ORAL | Status: DC
  Filled 2013-02-24: qty 1

## 2013-02-24 NOTE — Discharge Summary (Signed)
Physician Discharge Summary  [name] WJX:[id number] DOB: 1946-03-27 DOA: 02/21/2013  PCP: Irena Reichmann, DO  Admit date: 02/21/2013 Discharge date: 02/24/2013  Time spent: 35 minutes  Recommendations for Outpatient Follow-up:  1. Need referral to endocrinologist for work up of metabolic bone diseases. Need bone scan. 2. Needs to follow up with Primary cardiologist for holter monitor and further evaluation for syncope.  3. Needs to follow up with Dr Shon Baton and Dr Carola Frost.   Discharge Diagnoses:    Syncope   Atrial fibrillation   Hypothyroidism   Dyslipidemia   Vitamin D deficiency   GERD (gastroesophageal reflux disease)   Depression   Right tibial fracture   Fall   PAF (paroxysmal atrial fibrillation)   Chronic anticoagulation   Discharge Condition: Stable  Diet recommendation: [employer] Healthy  Filed Weights   02/21/13 0010  Weight: 138.8 kg (306 lb)    History of present illness:  [name] is an 67 y.o. male with hx of frequent fall, sleep apnea, afib on chronic Xarelto, hx of CAD, anxiety, depression, HTN, fell about 3 weeks ago requiring surgery of his right leg for TibFib Fx, was at his follow up visit and suddenly syncopized. He had no palpitation, chest pain, shortness of breath, or any warning. He was not confused when regaining consciousness. He was seen at [facility] ER, where work up included normal WBC, Hb of 12.4, normal renal fx tests, with Na 133, CBG 117, negative initial cardiac markers with EKG showing ST at 105, normal ST-T segments and normal intervals. His head CT was negative as well. D-Dimer was greater than 5000, resulting in a CTPA which was negative for PE. His UA was negative, and his LFTS were normal. His CXR was clear and his TSH was normal. Orthopedics requested him transferred here since he was just having surgery done here. When I saw him in his room, he was asymptomatic. He is admitted for syncope work up.   Hospital Course:   1. Syncope: unclear etiology, ? Vasovagal. Telemetry only show a fib rate controlled. Orthostatic vital negative. Troponin negative. Carotid doppler no significant stenosis. ECHO EF 55 %, LVH . Needs to follow up with primary cardiologist for Holter monitor. No further episodes.  2. A fib: continue with current regimen.  3. Closed R distal 1/3 tibial shaft fracture: I informed Dr handy of admission. Needs to follow up with Dr Carola Frost.  4. L 1 Compression fracture: I spoke with Dr Shon Baton, he is not planning surgery at this time. He wants for right tibial fracture to heal. Surgery if neuro deficit. Patient will need to have work up for metabolic bone diseases. Need bone scan.  5.   Procedures: ECHO: Left ventricle: Septal dyssynergy. Views are limited. Suspect the EF is 55%. The cavity size was mildly dilated. Wall thickness was increased in a pattern of mild LVH. Regional wall motion abnormalities cannot be excluded. - Left atrium: The atrium was moderately dilated. - Right ventricle: Systolic function was mildly reduced    Consultations:  Dr Shon Baton.  Discharge Exam: Filed Vitals:   02/23/13 1817 02/23/13 2112 02/23/13 2200 02/24/13 0534  BP: 97/64 119/78 119/78 122/65  Pulse: 81 90 90 86  Temp: 98.4 F (36.9 C)  98.9 F (37.2 C) 97.9 F (36.6 C)  TempSrc: Oral  Oral Oral  Resp: 20   19  Height:      Weight:      SpO2: 97%   98%    General: no distress.  Cardiovascular: S 1 S 2 IRR Respiratory: CTA Right LE with dressing.   Discharge Instructions  Discharge Orders   Future Appointments Provider Department Dept Phone   03/20/2013 10:00 AM Cliffton Asters, MD [facility] [phone]   Future Orders Complete By Expires     Diet - low sodium heart healthy  As directed     Increase activity slowly  As directed         Medication List    STOP taking these medications       etanercept 50 MG/ML injection  Commonly known as:  ENBREL      oxyCODONE 5 MG immediate release tablet  Commonly known as:  Oxy IR/ROXICODONE      TAKE these medications       amiodarone 200 MG tablet  Commonly known as:  PACERONE  Take 100 mg by mouth daily.     atorvastatin 80 MG tablet  Commonly known as:  LIPITOR  Take 80 mg by mouth at bedtime.     clonazePAM 2 MG tablet  Commonly known as:  KLONOPIN  Take 1 tablet (2 mg total) by mouth 3 (three) times daily.     clorazepate 15 MG tablet  Commonly known as:  TRANXENE-T  Take 1 tablet (15 mg total) by mouth daily as needed for anxiety.     diltiazem 240 MG 24 hr capsule  Commonly known as:  DILACOR XR  Take 240 mg by mouth daily.     DULoxetine 60 MG capsule  Commonly known as:  CYMBALTA  Take 60 mg by mouth 2 (two) times daily.     levothyroxine 125 MCG tablet  Commonly known as:  SYNTHROID, LEVOTHROID  Take 2 tablets (250 mcg total) by mouth daily before breakfast.     loratadine 10 MG tablet  Commonly known as:  CLARITIN  Take 10 mg by mouth daily.     methocarbamol 500 MG tablet  Commonly known as:  ROBAXIN  Take 1-2 tablets (500-1,000 mg total) by mouth every 6 (six) hours as needed.     metoprolol tartrate 25 MG tablet  Commonly known as:  LOPRESSOR  Take 25 mg by mouth daily as needed. For increased heart rate     multivitamin with minerals Tabs  Take 1 tablet by mouth daily.     nitroGLYCERIN 0.4 MG SL tablet  Commonly known as:  NITROSTAT  Place 0.4 mg under the tongue every 5 (five) minutes as needed for chest pain.     omeprazole 20 MG capsule  Commonly known as:  PRILOSEC  Take 20 mg by mouth daily.     oxyCODONE-acetaminophen 5-325 MG per tablet  Commonly known as:  PERCOCET/ROXICET  Take 1-2 tablets by mouth every 6 (six) hours as needed for pain.     polyethylene glycol packet  Commonly known as:  MIRALAX / GLYCOLAX  Take 17 g by mouth daily as needed. Constipation.     PROBIOTIC PO  Take 1 tablet by mouth daily.     ranolazine 500 MG  12 hr tablet  Commonly known as:  RANEXA  Take 500 mg by mouth 2 (two) times daily.     sulfamethoxazole-trimethoprim 800-160 MG per tablet  Commonly known as:  BACTRIM DS  Take 1 tablet by mouth 2 (two) times daily. On til July 2014 for bone infection in arm.     tamsulosin 0.4 MG Caps  Commonly known as:  FLOMAX  Take 0.4 mg by  mouth daily.     testosterone cypionate 200 MG/ML injection  Commonly known as:  DEPOTESTOTERONE CYPIONATE  Inject 200 mg into the muscle every 14 (fourteen) days.     VITAMIN C PO  Take 1 tablet by mouth daily.     Vitamin D 2000 UNITS Caps  Take 1 capsule by mouth daily.     XARELTO 20 MG Tabs  Generic drug:  Rivaroxaban  Take 20 mg by mouth daily with supper.       No Known Allergies     Follow-up Information   Follow up with COLLINS, DANA, DO.   Contact information:   [address] [place] [address] [phone]       Follow up with Alvy Beal, MD In 1 week.   Contact information:   [address], [address] [place] [address] [phone]       Follow up with Budd Palmer, MD Today.   Contact information:   [address] [place] [address] [phone]        The results of significant diagnostics from this hospitalization (including imaging, microbiology, ancillary and laboratory) are listed below for reference.    Significant Diagnostic Studies: Dg Thoracic Spine 2 View  02/08/2013   *RADIOLOGY REPORT*  Clinical Data: History of pain.  THORACIC SPINE - 2 VIEW  Comparison: 03/04/2011.  06/23/2011.  06/05/2012.  Findings: The vertebral column is well-aligned.  There is slight narrowing of some of the intervertebral disc spaces.  There is very slight scoliosis convexity to the right.  There is no evidence of wedge compression fracture.  Slight concavity of the endplates of the upper mid thoracic vertebral bodies on lateral image may be  developmental, or reflect the slight scoliosis, or reflect previous trauma.  I feel these are stable.  Mild degenerative spondylosis changes are seen.  IMPRESSION: Slight narrowing of some degenerative disc spaces.  Mild degenerative spondylosis changes.  No wedge compression fracture. Slight scoliosis.  Stable slight concavity of endplates of upper mid thoracic vertebral bodies.  This could be developmental, reflect scoliosis, reflect previous trauma.  No acute fracture with compression fracture is evident.   Original Report Authenticated By: Onalee Hua Call   Dg Lumbar Spine 2-3 Views  02/23/2013   *RADIOLOGY REPORT*  Clinical Data: Fracture.  LUMBAR SPINE - 2-3 VIEW  Comparison: 02/08/2013.  Findings: Request for standing views.  The patient was unable to put weight on foot.  Exam done in sitting position.  Evaluation limited by patient's habitus.  Prominent anterior wedge compression fracture of the L1 vertebral body (90% loss of height) with retropulsion and kyphosis centered at this level.  Limited evaluation of the surrounding vertebra.  The degree of kyphosis appears more prominent than on the CT scan.  Vascular calcifications.  IMPRESSION: Request for standing views.  The patient was unable to put weight on foot.  Exam done in sitting position.  Evaluation limited by patient's habitus.  Prominent anterior wedge compression fracture of the L1 vertebral body (90% loss of height) with retropulsion and kyphosis centered at this level.  Limited evaluation of the surrounding vertebra.  The degree of kyphosis appears more prominent than on the CT scan.   Original Report Authenticated By: Lacy Duverney, M.D.   Dg Lumbar Spine Complete  02/08/2013   *RADIOLOGY REPORT*  Clinical Data: Low back pain  LUMBAR SPINE - COMPLETE 4+ VIEW  Comparison: 12/01/2012  Findings: Right hip arthroplasty components  partially seen.  There is a progressive severe compression fracture deformity of L1 with near vertebral plana especially  anteriorly.  There is resultant kyphosis at the thoracolumbar junction.  Diffuse osteopenia. Moderate aortic calcifications.  IMPRESSION:  Significant progression of L1 compression fracture deformity.   Original Report Authenticated By: D. Andria Rhein, MD   Dg Tibia/fibula Right  02/06/2013   *RADIOLOGY REPORT*  Clinical Data: For fixation.  DG C-ARM 61-120 MIN, RIGHT TIBIA AND FIBULA - 2 VIEW  Comparison: Plain films 02/03/2013.  Findings: We are provided with seven fluoroscopic spot views of the right lower leg.  Images demonstrate placement of medial plate and screws for fixation of a distal tibial fracture and a retrograde IM nail for fixation of a fibular fracture.  Position and alignment are markedly improved.  No acute finding.  IMPRESSION: ORIF distal tibial and fibular fractures.   Original Report Authenticated By: Holley Dexter, M.D.   X-ray Tibia Fibula Right Ap And Lateral  02/06/2013   *RADIOLOGY REPORT*  Clinical Data: Postop ORIF.  RIGHT TIBIA AND FIBULA - 2 VIEW  Comparison: Intraoperative imaging 02/06/2013  Findings: Plate and screw fixation across the distal tibial fracture.  There is a wire within the mid and distal fibula.  No hardware complicating feature.  Small displaced bone fragment anteriorly on the lateral view, likely from the distal tibia. Changes of right knee replacement noted.  IMPRESSION: Internal fixation with near anatomic alignment.   Original Report Authenticated By: Charlett Nose, M.D.   Dg Ankle Complete Right  02/07/2013   *RADIOLOGY REPORT*  Clinical Data: Status post internal fixation of right ankle fracture.  RIGHT ANKLE - COMPLETE 3+ VIEW  Comparison: February 02, 2013.  Findings: Status post surgical internal fixation of the comminuted distal tibial fracture with improved alignment of fracture components.  Intramedullary rod fixation of comminuted distal fibular fracture is also noted. The joint has been casted and immobilized.  IMPRESSION: Status post internal  fixation of distal tibial and fibular fractures.   Original Report Authenticated By: Lupita Raider.,  M.D.   Ct Lumbar Spine Wo Contrast  02/08/2013   *RADIOLOGY REPORT*  Clinical Data: L1 compression fracture.  Fell 3 months ago.  Low back pain.  CT LUMBAR SPINE WITHOUT CONTRAST  Technique:  Multidetector CT imaging of the lumbar spine was performed without intravenous contrast administration. Multiplanar CT image reconstructions were also generated.  Comparison: MRI 02/08/2013  Findings: L1 compression fracture is redemonstrated. This is quite severe, nearly a vertebra plana deformity. This appears to be a benign osteopenic fracture without osseous destructive lesion. Vertebral body height anteriorly measures 6 mm and posteriorly 7 mm.  The vertebrae is too severely compressed to move consider vertebral augmentation with methylmethacrylate.  While the pedicles are not strictly involved with the fracture, there is significant comminution which extends up to the base of the pedicles as seen on image 34 series 3.   There is retropulsion of bone into the canal with severe stenosis; canal AP diameter 4 mm at its most narrow dimension (image 36 series 3). There is severe bilateral foraminal narrowing at L1-2 which affects the exiting L1 nerve roots.  As demonstrated on MR, there is severe spinal stenosis compressing the conus and intraspinal cauda equina nerve roots at the L1 level. There is a second fracture which extends obliquely along the right L1 transverse process; this is not clinically significant.  Other than diffusely osteopenic vertebrae, no other significant abnormalities are seen.  There is no evidence for  additional lumbar compression deformities.  Lower lumbar facet arthropathy is present.  There is mild annular bulging L5-S1.  Nonaneurysmal atherosclerotic calcification of the aorta.  No paravertebral masses.  Subcentimeter left renal calcifications, could be vascular or small nonobstructing calculus.   IMPRESSION: Severe L1 compression deformity, near vertebral plana at 6 mm height.  Significant retropulsion of bony fragment in the canal with severe spinal stenosis; AP diameter 4 mm in its most narrow dimension. Severe bilateral foraminal narrowing L1-L2.   Original Report Authenticated By: Davonna Belling, M.D.   Mr Lumbar Spine Wo Contrast  02/08/2013   *RADIOLOGY REPORT*  Clinical Data: L1 compression fracture.  Back pain.  MRI LUMBAR SPINE WITHOUT CONTRAST  Technique:  Multiplanar and multiecho pulse sequences of the lumbar spine were obtained without intravenous contrast.  Comparison: Radiographs dated 05/01 and 12/01/2012 and CT scan of the abdomen and pelvis dated 03/10/2011  Findings: There is a severe compression fracture of the L1 vertebral body with protrusion of the posterior aspect of the vertebra into the spinal canal at the level of the tip of the conus.  Bone protrudes approximately 9.5 mm into the spinal canal narrowing the AP dimension to approximately 6 mm.  There is no protrusion of disc material at the level of the fracture although the L1-2 disc does slightly bulge in to the neural foramina bilaterally without neural impingement.  T10-11 and T11-12:  Normal.  L2-3 and L3-4:  Normal.  L4-5:  The disc is normal.  There is hypertrophy of the ligamenta flava and the patient has congenitally short pedicles which narrow the spinal canal and both lateral recesses.  Slight degenerative changes of the facet joints.  L5 S1:  Tiny broad-based disc bulge.  No neural impingement.  IMPRESSION:  1.  Benign-appearing severe compression fracture of L1.  Bone protrudes into the spinal canal and compresses the thecal sac fairly severely at the level of the tip of the conus. 2.  No significant disc protrusions. 3.  Slight spinal stenosis and bilateral lateral recess stenosis at L4-5 due to hypertrophy of the ligamenta flava and facet joints.   Original Report Authenticated By: Francene Boyers, M.D.   Dg Chest  Portable 1 View  02/03/2013   *RADIOLOGY REPORT*  Clinical Data: Preoperative.  No chest complaints.  History of hypertension.  Atrial fibrillation.  Previous smoker.  PORTABLE CHEST - 1 VIEW  Comparison: 06/05/2012  Findings: Shallow inspiration. Borderline heart size and pulmonary vascularity are likely normal for technique.  No focal consolidation.  No blunting of costophrenic angles.  No pneumothorax.  Mediastinal contours appear intact.  Degenerative changes in the shoulders.  No significant change since previous study.  IMPRESSION: Shallow inspiration.  No evidence of active pulmonary disease.   Original Report Authenticated By: Burman Nieves, M.D.   Dg Tibia/fibula Right Port  02/03/2013   *RADIOLOGY REPORT*  Clinical Data: Preoperative evaluation of the distal right tibial and fibular fracture.  PORTABLE RIGHT TIBIA AND FIBULA - 2 VIEW  Comparison: 02/02/2013.  Findings: AP and lateral views of the right tibia and fibula demonstrate comminuted fractures of the distal third of the tibial and fibular diaphyses.  Alignment has slightly improved and is in near anatomic with the overlying splint in place, however, there does appear to be some mild residual anterior angulation and mild impaction.  Proximal two thirds of the tibia and fibula are intact. Status post right total knee arthroplasty.  The femoral and tibial components of the prosthesis both appear well seated without  definite periprosthetic fracture.  IMPRESSION: 1.  Comminuted impacted mildly angulated fractures of the distal third of the tibial and fibular diaphyses redemonstrated, with slight improvement in alignment, as above.   Original Report Authenticated By: Trudie Reed, M.D.   Dg C-arm [phone] Min  02/06/2013   *RADIOLOGY REPORT*  Clinical Data: For fixation.  DG C-ARM 61-120 MIN, RIGHT TIBIA AND FIBULA - 2 VIEW  Comparison: Plain films 02/03/2013.  Findings: We are provided with seven fluoroscopic spot views of the right lower leg.   Images demonstrate placement of medial plate and screws for fixation of a distal tibial fracture and a retrograde IM nail for fixation of a fibular fracture.  Position and alignment are markedly improved.  No acute finding.  IMPRESSION: ORIF distal tibial and fibular fractures.   Original Report Authenticated By: Holley Dexter, M.D.    Microbiology: No results found for this or any previous visit (from the past 240 hour(s)).   Labs: Basic Metabolic Panel: No results found for this basename: NA, K, CL, CO2, GLUCOSE, BUN, CREATININE, CALCIUM, MG, PHOS,  in the last 168 hours Liver Function Tests: No results found for this basename: AST, ALT, ALKPHOS, BILITOT, PROT, ALBUMIN,  in the last 168 hours No results found for this basename: LIPASE, AMYLASE,  in the last 168 hours No results found for this basename: AMMONIA,  in the last 168 hours CBC: No results found for this basename: WBC, NEUTROABS, HGB, HCT, MCV, PLT,  in the last 168 hours Cardiac Enzymes:  Recent Labs Lab 02/21/13 1240 02/21/13 2146 02/22/13 0455 02/22/13 1009  TROPONINI <0.30  <0.30 <0.30 <0.30 <0.30   BNP: BNP (last 3 results) No results found for this basename: PROBNP,  in the last 8760 hours CBG: No results found for this basename: GLUCAP,  in the last 168 hours     Signed:  REGALADO,BELKYS  [facility] 02/24/2013, 11:11 AM

## 2013-02-24 NOTE — Progress Notes (Signed)
Discharged to home with family office visits in place teaching done  

## 2013-02-24 NOTE — Progress Notes (Signed)
Patient is all set to discharge to [facility] today. Patient & significant other, [name] at bedside & aware. Discharge packet in [place]. [facility] scheduled for 1:00pm pickup.   Clinical Social Work Department CLINICAL SOCIAL WORK PLACEMENT NOTE 02/24/2013  Patient:  [name], [name]  Account Number:  [id number] Admit date:  02/21/2013  Clinical Social Worker:  Rozetta Nunnery, Theresia Majors  Date/time:  02/23/2013 03:50 PM  Clinical Social Work is seeking post-discharge placement for this patient at the following level of care:   SKILLED NURSING   (*CSW will update this form in Epic as items are completed)   02/22/2013  Patient/family provided with [facility] Department of Clinical Social Work's list of facilities offering this level of care within the geographic area requested by the patient (or if unable, by the patient's family).  02/22/2013  Patient/family informed of their freedom to choose among providers that offer the needed level of care, that participate in Medicare, Medicaid or managed care program needed by the patient, have an available bed and are willing to accept the patient.  02/22/2013  Patient/family informed of [facility]' ownership interest in [facility], as well as of the fact that they are under no obligation to receive care at this facility.  PASARR submitted to EDS on 02/23/2013 PASARR number received from EDS on 02/23/2013  FL2 transmitted to all facilities in geographic area requested by pt/family on  06/07/2012 FL2 transmitted to all facilities within larger geographic area on 06/07/2012  Patient informed that his/her managed care company has contracts with or will negotiate with  certain facilities, including the following:     Patient/family informed of bed offers received:  02/23/2013 Patient chooses bed at [facility] [place] Physician recommends and patient chooses bed at    Patient to be  transferred to [facility] [place] on  02/24/2013 Patient to be transferred to facility by [facility]  The following physician request were entered in Epic:   Additional Comments:   Unice Bailey, LCSW [facility] Clinical Social Worker cell #: [phone]

## 2013-02-24 NOTE — Progress Notes (Signed)
    Subjective:      Patient reports pain as 4 on 0-10 scale.  Reports unchanged leg pain back pain unchanged   Positive void Positive bowel movement Positive flatus Negative chest pain or shortness of breath  Objective: Vital signs in last 24 hours: Temp:  [97.9 F (36.6 C)-98.9 F (37.2 C)] 97.9 F (36.6 C) (05/17 0534) Pulse Rate:  [81-90] 86 (05/17 0534) Resp:  [19-20] 19 (05/17 0534) BP: (97-122)/(64-78) 122/65 mmHg (05/17 0534) SpO2:  [97 %-98 %] 98 % (05/17 0534)  Intake/Output from previous day: 05/16 0701 - 05/17 0700 In: 600 [P.O.:600] Out: 1525 [Urine:1525]  Labs: No results found for this basename: WBC, RBC, HCT, PLT,  in the last 72 hours No results found for this basename: NA, K, CL, CO2, BUN, CREATININE, GLUCOSE, CALCIUM,  in the last 72 hours No results found for this basename: LABPT, INR,  in the last 72 hours  Physical Exam: Neurologically intact Compartment soft  Assessment/Plan: Patient stable  xrays no significant change in upright spine films Continue mobilization with physical therapy Continue care  Discharge to SNF F/u with me in 4 weeks Patient has been non-compliant with back brace and I have explained importance of the brace No acute neurologic deficits - will no need urgent surgery Once metabolic bone work-up complete can consider surgery  Venita Lick, MD [facility] [phone]

## 2013-02-26 ENCOUNTER — Other Ambulatory Visit: Payer: Self-pay | Admitting: *Deleted

## 2013-02-26 MED ORDER — OXYCODONE-ACETAMINOPHEN 5-325 MG PO TABS: ORAL_TABLET | ORAL | Status: DC

## 2013-02-27 ENCOUNTER — Non-Acute Institutional Stay (SKILLED_NURSING_FACILITY): Payer: Medicare Other | Admitting: Internal Medicine

## 2013-02-27 ENCOUNTER — Encounter: Payer: Self-pay | Admitting: Internal Medicine

## 2013-02-27 DIAGNOSIS — M79604 Pain in right leg: Secondary | ICD-10-CM

## 2013-02-27 DIAGNOSIS — M8000XG Age-related osteoporosis with current pathological fracture, unspecified site, subsequent encounter for fracture with delayed healing: Secondary | ICD-10-CM

## 2013-02-27 DIAGNOSIS — D649 Anemia, unspecified: Secondary | ICD-10-CM

## 2013-02-27 DIAGNOSIS — M8448XD Pathological fracture, other site, subsequent encounter for fracture with routine healing: Secondary | ICD-10-CM

## 2013-02-27 DIAGNOSIS — R5381 Other malaise: Secondary | ICD-10-CM

## 2013-02-27 DIAGNOSIS — W19XXXS Unspecified fall, sequela: Secondary | ICD-10-CM

## 2013-02-27 DIAGNOSIS — M545 Low back pain, unspecified: Secondary | ICD-10-CM

## 2013-02-27 DIAGNOSIS — I4891 Unspecified atrial fibrillation: Secondary | ICD-10-CM

## 2013-02-27 DIAGNOSIS — M79605 Pain in left leg: Secondary | ICD-10-CM

## 2013-02-27 DIAGNOSIS — R7309 Other abnormal glucose: Secondary | ICD-10-CM

## 2013-02-27 DIAGNOSIS — R7989 Other specified abnormal findings of blood chemistry: Secondary | ICD-10-CM

## 2013-02-27 DIAGNOSIS — S8290XS Unspecified fracture of unspecified lower leg, sequela: Secondary | ICD-10-CM

## 2013-02-27 DIAGNOSIS — E559 Vitamin D deficiency, unspecified: Secondary | ICD-10-CM

## 2013-02-27 DIAGNOSIS — S82201S Unspecified fracture of shaft of right tibia, sequela: Secondary | ICD-10-CM

## 2013-02-27 DIAGNOSIS — M8080XG Other osteoporosis with current pathological fracture, unspecified site, subsequent encounter for fracture with delayed healing: Secondary | ICD-10-CM

## 2013-02-27 DIAGNOSIS — IMO0002 Reserved for concepts with insufficient information to code with codable children: Secondary | ICD-10-CM

## 2013-02-27 DIAGNOSIS — R55 Syncope and collapse: Secondary | ICD-10-CM

## 2013-02-27 DIAGNOSIS — R739 Hyperglycemia, unspecified: Secondary | ICD-10-CM

## 2013-02-27 DIAGNOSIS — I480 Paroxysmal atrial fibrillation: Secondary | ICD-10-CM

## 2013-02-27 DIAGNOSIS — I10 Essential (primary) hypertension: Secondary | ICD-10-CM

## 2013-02-27 DIAGNOSIS — S32010S Wedge compression fracture of first lumbar vertebra, sequela: Secondary | ICD-10-CM

## 2013-02-27 DIAGNOSIS — M810 Age-related osteoporosis without current pathological fracture: Secondary | ICD-10-CM

## 2013-02-27 DIAGNOSIS — E669 Obesity, unspecified: Secondary | ICD-10-CM

## 2013-02-27 DIAGNOSIS — E291 Testicular hypofunction: Secondary | ICD-10-CM

## 2013-02-27 NOTE — Progress Notes (Signed)
Date: 02/27/2013  MRN:  [id number] Name:  [name] Sex:  male Age:  67 y.o. DOB:Oct 30, 1945   [facility] #:                       Facility/Room:  [facility] [place], [place] Level Of Care:  SNF Provider:   Kermit Balo, DO, CMD  Emergency Contacts: Contact Information   Name Relation Home Work Mobile   Ruggles,Michael Significant other [phone]  [phone]     Code Status:  Full code  Allergies:No Known Allergies   Chief Complaint  Patient presents with  . Hospitalization Follow-up    new admission s/p hospitalization with syncope  . Rash    shingles vs. psoriasis  . Fatigue    family requests decreased psychotropic meds   HPI:  67 yo male with h/o afib, hypothyroidism, CAD with associated htn and hyperlipidemia, OSA, GERD, obesity, and osteoarthritis admitted for STR s/p hospitalization with syncope.  He has had a rocky course recently with multiple falls and fractures then complicated by infection.  He is unclear as to why he is breaking his bones when he falls.  He is on multiple psychotropic meds (some were reduced, but several remain--clorazepate prn, klonopin, and cymbalta high dose).    Past Medical History  Diagnosis Date  . Sleep apnea     cpap machine  . Acid reflux   . A-fib   . Thyroid disease   . Low testosterone, possible hypogonadism 10/01/2012  . Lumbago   . High cholesterol   . Anxiety   . Depression   . CAD (coronary artery disease)   . Hypertension     patient states he does not have high blood pressure  . Arthritis     Past Surgical History  Procedure Laterality Date  . Knee surgery  2012  . Appendectomy    . Right femur surgery  05/2012  . Orif wrist fracture  10/02/2012    Procedure: OPEN REDUCTION INTERNAL FIXATION (ORIF) WRIST FRACTURE;  Surgeon: Dominica Severin, MD;  Location: [facility];  Service: Orthopedics;  Laterality: Right;  WITH   ANTIBIOTIC  CEMENT  . Total hip revision  10/05/2012    Procedure: TOTAL HIP REVISION;   Surgeon: Shelda Pal, MD;  Location: [facility];  Service: Orthopedics;  Laterality: Right;  RIGHT TOTAL HIP REVISION  . Hardware removal  10/05/2012    Procedure: HARDWARE REMOVAL;  Surgeon: Shelda Pal, MD;  Location: [facility];  Service: Orthopedics;  Laterality: Right;  REMOVING  STRYKER  GAMMA NAIL  . Wrist fracture surgery  05/2012  . Orif tibia fracture Right 02/06/2013    Procedure: OPEN REDUCTION INTERNAL FIXATION (ORIF) TIBIA FRACTURE WITH IM ROD FIBULA;  Surgeon: Budd Palmer, MD;  Location: [facility];  Service: Orthopedics;  Laterality: Right;     Current Outpatient Prescriptions  Medication Sig Dispense Refill  . amiodarone (PACERONE) 200 MG tablet Take 100 mg by mouth daily.      . Ascorbic Acid (VITAMIN C PO) Take 1 tablet by mouth daily.      [name] atorvastatin (LIPITOR) 80 MG tablet Take 80 mg by mouth at bedtime.       . Cholecalciferol (VITAMIN D) 2000 UNITS CAPS Take 1 capsule by mouth daily.      . clonazePAM (KLONOPIN) 2 MG tablet Take 1 tablet (2 mg total) by mouth 3 (three) times daily.  90 tablet  0  . clorazepate (TRANXENE-T) 15 MG tablet  Take 1 tablet (15 mg total) by mouth daily as needed for anxiety.  30 tablet  0  . diltiazem (DILACOR XR) 240 MG 24 hr capsule Take 240 mg by mouth daily.      . DULoxetine (CYMBALTA) 60 MG capsule Take 60 mg by mouth 2 (two) times daily.      [name] levothyroxine (SYNTHROID, LEVOTHROID) 125 MCG tablet Take 2 tablets (250 mcg total) by mouth daily before breakfast.      . loratadine (CLARITIN) 10 MG tablet Take 10 mg by mouth daily.      . methocarbamol (ROBAXIN) 500 MG tablet Take 1-2 tablets (500-1,000 mg total) by mouth every 6 (six) hours as needed.  60 tablet  0  . metoprolol tartrate (LOPRESSOR) 25 MG tablet Take 25 mg by mouth daily as needed. For increased heart rate      . Multiple Vitamin (MULTIVITAMIN WITH MINERALS) TABS Take 1 tablet by mouth daily.      . nitroGLYCERIN (NITROSTAT) 0.4 MG SL tablet Place 0.4 mg under the tongue every  5 (five) minutes as needed for chest pain.      [name] omeprazole (PRILOSEC) 20 MG capsule Take 20 mg by mouth daily.      [name] oxyCODONE-acetaminophen (PERCOCET/ROXICET) 5-325 MG per tablet Take one tablet every 6 hours as needed for pain  120 tablet  0  . polyethylene glycol (MIRALAX / GLYCOLAX) packet Take 17 g by mouth daily as needed. Constipation.      . Probiotic Product (PROBIOTIC PO) Take 1 tablet by mouth daily.      . ranolazine (RANEXA) 500 MG 12 hr tablet Take 500 mg by mouth 2 (two) times daily.      . Rivaroxaban (XARELTO) 20 MG TABS Take 20 mg by mouth daily with supper.      . sulfamethoxazole-trimethoprim (BACTRIM DS) 800-160 MG per tablet Take 1 tablet by mouth 2 (two) times daily. On til July 2014 for bone infection in arm.      . Tamsulosin HCl (FLOMAX) 0.4 MG CAPS Take 0.4 mg by mouth daily.      [name] testosterone cypionate (DEPOTESTOTERONE CYPIONATE) 200 MG/ML injection Inject 200 mg into the muscle every 14 (fourteen) days.       No current facility-administered medications for this visit.    There is no immunization history for the selected administration types on file for this patient.   History  Substance Use Topics  . Smoking status: Former Smoker    Quit date: 06/05/2012  . Smokeless tobacco: Not on file  . Alcohol Use: Yes     Comment: occasional    Family History  Problem Relation Age of Onset  . CAD Father 48  . Arthritis Mother     Review of Systems  Constitutional: Positive for malaise/fatigue. Negative for fever.  HENT: Positive for hearing loss. Negative for congestion.   Eyes: Negative for blurred vision.  Respiratory: Negative for cough and shortness of breath.   Cardiovascular: Negative for chest pain.  Gastrointestinal: Positive for heartburn and constipation. Negative for abdominal pain, blood in stool and melena.  Genitourinary: Negative for dysuria.  Musculoskeletal: Positive for back pain, joint pain and falls.  Skin: Positive for itching and  rash.  Neurological: Positive for dizziness, loss of consciousness and weakness. Negative for focal weakness.  Endo/Heme/Allergies: Bruises/bleeds easily.  Psychiatric/Behavioral: Positive for depression. Negative for memory loss. The patient is nervous/anxious. The patient does not have insomnia.     Vital signs: BP 132/68  Pulse 74  Temp(Src) 97.2 F (36.2 C)  Resp 18  Ht 6\' 4"  (1.93 m)  Wt 307 lb (139.254 kg)  BMI 37.38 kg/m2 Physical Exam  Constitutional: He is oriented to person, place, and time.  Obese white male, NAD, wearing brace on right wrist, boot on right leg, hard back support brace on chest/back  HENT:  Head: Normocephalic and atraumatic.  Right Ear: External ear normal.  Left Ear: External ear normal.  Nose: Nose normal.  Mouth/Throat: Oropharynx is clear and moist. No oropharyngeal exudate.  Eyes: Conjunctivae and EOM are normal. Pupils are equal, round, and reactive to light. No scleral icterus.  Neck: Normal range of motion. No JVD present. No tracheal deviation present. No thyromegaly present.  Cardiovascular: Intact distal pulses.   irreg irreg  Pulmonary/Chest: Effort normal and breath sounds normal. No respiratory distress.  Abdominal: Soft. Bowel sounds are normal. He exhibits no distension and no mass. There is no tenderness.  Musculoskeletal: He exhibits no edema.  Lymphadenopathy:    He has no cervical adenopathy.  Neurological: He is alert and oriented to person, place, and time. No cranial nerve deficit.  Skin: Skin is warm and dry.    Plan:  1. Vitamin D deficiency Check levels and replete as needed 2. Syncope unclear etiology, ? Vasovagal. Telemetry only show a fib rate controlled. Orthostatic vital negative. Troponin negative. Carotid doppler no significant stenosis. ECHO EF 55 %, LVH . Needs to follow up with primary cardiologist for Holter monitor. No further episodes.  I am concerned about his meds causing him to be oversedated.  Will plan  to meet with his husband and discuss tapering off some of these as he tolerates after he adjusts to the facility (has longstanding anxiety) 3. Right tibial fracture, sequela  Needs to follow up with Dr Carola Frost. Wearing boot, to work with pt, ot here. 4. Physical deconditioning For therapy here due to generalized weakness, multiple fractures, dizziness on standing.  Monitor for orthostatic hypotension.  Will work on reducing medication burden. 5. Pathological fracture due to osteoporosis with delayed healing Right tibia--to receive bone stimulation here as recommended by Dr. Carola Frost 6. PAF (paroxysmal atrial fibrillation) continue with current regimen including long term xarelto.  7. Osteoporosis with fracture, with delayed healing, subsequent encounter Will arrange bone density when he is stabilized, replete ca and vit D as needed--await ability to bear weight more regularly and assess for testosterone deficiency  8. Obesity, unspecified Encouraged weight loss with diet and will be working with therapy. Wants to be more active but has been limited by his dizziness, falls, and fractures.  9. Low back pain radiating to both legs Some chronic pain-is on cymbalta, will work to reduce narcotics and possibly lower this dose as well if tolerates as he recovers  10. Low testosterone, possible hypogonadism Check free testosterone level  11. Hyperglycemia Is trying to adhere to low carb diet here, is aware he needs to lose weight.    12. HTN (hypertension) Goal is <130/80, but pt may not tolerate this--has some dizziness on standing, will r/o orthostatic hypotension  13. Fall, sequela Here for therapy, has multiple fxs (vertebral compression, right tibia, right wrist with osteomyelitis)  14. Anemia F/u cbc  15. Compression fracture of L1 lumbar vertebra, sequela Dr Shon Baton is not planning surgery at this time. He wants for right tibial fracture to heal. I will arrange for a bone density study  while he is here once he has done some therapy and made  progress.  He's been told that his bone in his back is too fragile to be used to support any type of implanted hardware at this point.  I will check his vitamin D levels and likely place him on vitamin D.  We will check his free testosterone levels due to possible hypogonadism as the etiology of his osteoporosis.  He has also been a smoker in the past.  Labs:  Vitamin D, bmp, cbc, free testosterone next draw

## 2013-03-01 ENCOUNTER — Other Ambulatory Visit: Payer: Self-pay | Admitting: *Deleted

## 2013-03-08 ENCOUNTER — Non-Acute Institutional Stay (SKILLED_NURSING_FACILITY): Payer: Medicare Other | Admitting: Internal Medicine

## 2013-03-08 DIAGNOSIS — R21 Rash and other nonspecific skin eruption: Secondary | ICD-10-CM | POA: Insufficient documentation

## 2013-03-08 DIAGNOSIS — I10 Essential (primary) hypertension: Secondary | ICD-10-CM

## 2013-03-08 DIAGNOSIS — I4891 Unspecified atrial fibrillation: Secondary | ICD-10-CM

## 2013-03-08 NOTE — Progress Notes (Signed)
Patient ID: [name], male   DOB: 01/26/1946, 67 y.o.   MRN: [id number]    PCP: Irena Reichmann, DO  Code Status: full code  No Known Allergies  Chief Complaint: AV- rash  HPI:  67 y/o male patient is here for rehab. He has noticed rash on his chest and trunk and arms since January which is now getting worse and associated with intense pruritis. He denies any SOB, swelling of his tongue, difficulty with breathing or swelling of limbs. Denies any problem with vision, runny nose or ear pain. No new products, medications used. No difficulty swallowing He has history of psoirasis bp on elevated side  Review of Systems:  No fever no chills No pain in rash area No urinary complaints No abdominal pain, nausea or vomiting No headache No chest pain or SOB  Past Medical History  Diagnosis Date  . Sleep apnea     cpap machine  . Acid reflux   . A-fib   . Thyroid disease   . Low testosterone, possible hypogonadism 10/01/2012  . Lumbago   . High cholesterol   . Anxiety   . Depression   . CAD (coronary artery disease)   . Hypertension     patient states he does not have high blood pressure  . Arthritis    Past Surgical History  Procedure Laterality Date  . Knee surgery  2012  . Appendectomy    . Right femur surgery  05/2012  . Orif wrist fracture  10/02/2012    Procedure: OPEN REDUCTION INTERNAL FIXATION (ORIF) WRIST FRACTURE;  Surgeon: Dominica Severin, MD;  Location: [facility];  Service: Orthopedics;  Laterality: Right;  WITH   ANTIBIOTIC  CEMENT  . Total hip revision  10/05/2012    Procedure: TOTAL HIP REVISION;  Surgeon: Shelda Pal, MD;  Location: [facility];  Service: Orthopedics;  Laterality: Right;  RIGHT TOTAL HIP REVISION  . Hardware removal  10/05/2012    Procedure: HARDWARE REMOVAL;  Surgeon: Shelda Pal, MD;  Location: [facility];  Service: Orthopedics;  Laterality: Right;  REMOVING  STRYKER  GAMMA NAIL  . Wrist fracture surgery  05/2012  . Orif tibia fracture Right  02/06/2013    Procedure: OPEN REDUCTION INTERNAL FIXATION (ORIF) TIBIA FRACTURE WITH IM ROD FIBULA;  Surgeon: Budd Palmer, MD;  Location: [facility];  Service: Orthopedics;  Laterality: Right;   Social History:   reports that he quit smoking about 9 months ago. He does not have any smokeless tobacco history on file. He reports that  drinks alcohol. He reports that he does not use illicit drugs.  Family History  Problem Relation Age of Onset  . CAD Father 95  . Arthritis Mother     Medications: Patient's Medications  New Prescriptions   No medications on file  Previous Medications   AMIODARONE (PACERONE) 200 MG TABLET    Take 100 mg by mouth daily.   ASCORBIC ACID (VITAMIN C PO)    Take 1 tablet by mouth daily.   ATORVASTATIN (LIPITOR) 80 MG TABLET    Take 80 mg by mouth at bedtime.    CHOLECALCIFEROL (VITAMIN D) 2000 UNITS CAPS    Take 1 capsule by mouth daily.   CLONAZEPAM (KLONOPIN) 2 MG TABLET    Take 1 tablet (2 mg total) by mouth 3 (three) times daily.   CLORAZEPATE (TRANXENE-T) 15 MG TABLET    Take 1 tablet (15 mg total) by mouth daily as needed for anxiety.   DILTIAZEM (  DILACOR XR) 240 MG 24 HR CAPSULE    Take 240 mg by mouth daily.   DULOXETINE (CYMBALTA) 60 MG CAPSULE    Take 60 mg by mouth 2 (two) times daily.   LEVOTHYROXINE (SYNTHROID, LEVOTHROID) 125 MCG TABLET    Take 2 tablets (250 mcg total) by mouth daily before breakfast.   LORATADINE (CLARITIN) 10 MG TABLET    Take 10 mg by mouth daily.   METHOCARBAMOL (ROBAXIN) 500 MG TABLET    Take 1-2 tablets (500-1,000 mg total) by mouth every 6 (six) hours as needed.   METOPROLOL TARTRATE (LOPRESSOR) 25 MG TABLET    Take 25 mg by mouth daily as needed. For increased heart rate   MULTIPLE VITAMIN (MULTIVITAMIN WITH MINERALS) TABS    Take 1 tablet by mouth daily.   NITROGLYCERIN (NITROSTAT) 0.4 MG SL TABLET    Place 0.4 mg under the tongue every 5 (five) minutes as needed for chest pain.   OMEPRAZOLE (PRILOSEC) 20 MG CAPSULE    Take  20 mg by mouth daily.   OXYCODONE-ACETAMINOPHEN (PERCOCET/ROXICET) 5-325 MG PER TABLET    Take one tablet every 6 hours as needed for pain   POLYETHYLENE GLYCOL (MIRALAX / GLYCOLAX) PACKET    Take 17 g by mouth daily as needed. Constipation.   PROBIOTIC PRODUCT (PROBIOTIC PO)    Take 1 tablet by mouth daily.   RANOLAZINE (RANEXA) 500 MG 12 HR TABLET    Take 500 mg by mouth 2 (two) times daily.   RIVAROXABAN (XARELTO) 20 MG TABS    Take 20 mg by mouth daily with supper.   SULFAMETHOXAZOLE-TRIMETHOPRIM (BACTRIM DS) 800-160 MG PER TABLET    Take 1 tablet by mouth 2 (two) times daily. On til July 2014 for bone infection in arm.   TAMSULOSIN HCL (FLOMAX) 0.4 MG CAPS    Take 0.4 mg by mouth daily.   TESTOSTERONE CYPIONATE (DEPOTESTOTERONE CYPIONATE) 200 MG/ML INJECTION    Inject 200 mg into the muscle every 14 (fourteen) days.  Modified Medications   No medications on file  Discontinued Medications   No medications on file    Physical Exam: Filed Vitals:   03/08/13 1419  BP: 156/82  Pulse: 79  Temp: 97 F (36.1 C)  Resp: 18  Height: 6\' 4"  (1.93 m)  Weight: 306 lb (138.801 kg)  SpO2: 96%   gen- obese male in NAD heent- MMM, NO JVD, NO LAD, no pallor, no icterus cvs- n s1, s2, rrr respi- b/l cta Skin- dry skin, psoriatic patches on elbows and back, erythema with macules and papules on the back and chest and arm, no pustules, no drainage or open sores Neuro- aaox 3, non focal  Labs reviewed: Basic Metabolic Panel:  Recent Labs  40/98/11 0610 02/08/13 0431 02/09/13 0530  NA 134* 134* 134*  K 4.2 4.2 3.8  CL 99 99 97  CO2 29 29 30   GLUCOSE 137* 96 106*  BUN 15 16 16   CREATININE 0.89 0.88 0.90  CALCIUM 8.4 8.7 8.8   Liver Function Tests:  Recent Labs  02/07/13 0610 02/08/13 0431 02/09/13 0530  AST 12 11 12   ALT 12 10 11   ALKPHOS 154* 147* 145*  BILITOT 0.4 0.4 0.4  PROT 5.6* 5.7* 5.8*  ALBUMIN 2.5* 2.5* 2.5*   CBC:  Recent Labs  09/28/12 2340  02/07/13 0610  02/08/13 0431 02/09/13 0530  WBC 9.3  < > 6.9 5.8 5.3  NEUTROABS 5.9  --   --   --   --  HGB 10.8*  < > 10.2* 9.9* 9.7*  HCT 33.2*  < > 30.4* 30.1* 29.8*  MCV 82.4  < > 80.9 81.4 81.0  PLT 277  < > 264 257 254  < > = values in this interval not displayed. Cardiac Enzymes:  Recent Labs  02/21/13 2146 02/22/13 0455 02/22/13 1009  TROPONINI <0.30 <0.30 <0.30   CBG:  Recent Labs  02/07/13 0643 02/07/13 1123 02/07/13 1614  GLUCAP 141* 103* 122*    Assessment/Plan  Rash- with pruritis. Has history of psoriasis and is on 1% hydrocortisone cream. Used to be on etanercept before but currently off it.This could be a flare up of his psoriasis but given the generalized macular rash with some papules and intense itching Will have him on benadryl 25 mg po q8h prn for itching With prednisone 40 mg po daily for 5 days and reassess. If persists or worsens, will consider dermatology referral  HTN- bp on elevated side. Pt was on metoprolol in hospital. Will resume metoprolol at 12.5 mg bid for now and reassess. He also has afib and metoprolol will help with rate control  afib- rate currently controlled. Continue amiodarone. Add metoprolol for rate control and bp

## 2013-03-19 ENCOUNTER — Ambulatory Visit (INDEPENDENT_AMBULATORY_CARE_PROVIDER_SITE_OTHER): Payer: Medicare Other | Admitting: Internal Medicine

## 2013-03-19 ENCOUNTER — Encounter: Payer: Self-pay | Admitting: Internal Medicine

## 2013-03-19 VITALS — BP 103/66 | HR 80 | Temp 97.7°F

## 2013-03-19 DIAGNOSIS — M869 Osteomyelitis, unspecified: Secondary | ICD-10-CM

## 2013-03-19 LAB — SEDIMENTATION RATE: Sed Rate: 6 mm/hr (ref 0–16)

## 2013-03-19 NOTE — Progress Notes (Signed)
Patient ID: [name], male   DOB: July 24, 1946, 67 y.o.   MRN: [id number]         [facility] for Infectious Disease  Patient Active Problem List   Diagnosis Date Noted  . Osteomyelitis of right wrist 11/02/2012    Priority: High  . Right forearm fracture 09/29/2012    Priority: High  . Right femoral fracture 09/29/2012    Priority: Medium  . Rash and nonspecific skin eruption 03/08/2013  . Syncope 02/21/2013  . PAF (paroxysmal atrial fibrillation) 02/21/2013  . Chronic anticoagulation 02/21/2013  . Fall 02/08/2013  . Physical deconditioning 02/08/2013  . Low back pain radiating to both legs 02/08/2013  . Compression fracture of L1 lumbar vertebra 02/08/2013  . Right tibial fracture 02/05/2013  . Obesity, unspecified 02/05/2013  . Anemia 10/06/2012  . HTN (hypertension) 10/03/2012  . Psoriasis 10/03/2012  . Dyslipidemia 10/03/2012  . Vitamin D deficiency 10/03/2012  . GERD (gastroesophageal reflux disease) 10/03/2012  . Hyperglycemia 10/03/2012  . Anxiety 10/03/2012  . Depression 10/03/2012  . CAD (coronary artery disease) 10/03/2012  . DJD (degenerative joint disease) 10/03/2012  . Chronic gingivitis 10/03/2012  . Low testosterone, possible hypogonadism 10/01/2012  . Normocytic anemia 09/30/2012  . Atrial fibrillation 09/29/2012  . Hypothyroidism 09/29/2012  . History of recurrent UTIs 09/29/2012    Patient's Medications  New Prescriptions   No medications on file  Previous Medications   AMIODARONE (PACERONE) 200 MG TABLET    Take 100 mg by mouth daily.   ASCORBIC ACID (VITAMIN C PO)    Take 1 tablet by mouth daily.   ATORVASTATIN (LIPITOR) 80 MG TABLET    Take 80 mg by mouth at bedtime.    CHOLECALCIFEROL (VITAMIN D) 2000 UNITS CAPS    Take 1 capsule by mouth daily.   CLONAZEPAM (KLONOPIN) 2 MG TABLET    Take 1 tablet (2 mg total) by mouth 3 (three) times daily.   CLORAZEPATE (TRANXENE-T) 15 MG TABLET    Take 1 tablet (15 mg total) by mouth daily as  needed for anxiety.   DILTIAZEM (DILACOR XR) 240 MG 24 HR CAPSULE    Take 240 mg by mouth daily.   DULOXETINE (CYMBALTA) 60 MG CAPSULE    Take 60 mg by mouth 2 (two) times daily.   LEVOTHYROXINE (SYNTHROID, LEVOTHROID) 125 MCG TABLET    Take 2 tablets (250 mcg total) by mouth daily before breakfast.   LORATADINE (CLARITIN) 10 MG TABLET    Take 10 mg by mouth daily.   METHOCARBAMOL (ROBAXIN) 500 MG TABLET    Take 1-2 tablets (500-1,000 mg total) by mouth every 6 (six) hours as needed.   METOPROLOL TARTRATE (LOPRESSOR) 25 MG TABLET    Take 25 mg by mouth daily as needed. For increased heart rate   MULTIPLE VITAMIN (MULTIVITAMIN WITH MINERALS) TABS    Take 1 tablet by mouth daily.   NITROGLYCERIN (NITROSTAT) 0.4 MG SL TABLET    Place 0.4 mg under the tongue every 5 (five) minutes as needed for chest pain.   OMEPRAZOLE (PRILOSEC) 20 MG CAPSULE    Take 20 mg by mouth daily.   OXYCODONE-ACETAMINOPHEN (PERCOCET/ROXICET) 5-325 MG PER TABLET    Take one tablet every 6 hours as needed for pain   POLYETHYLENE GLYCOL (MIRALAX / GLYCOLAX) PACKET    Take 17 g by mouth daily as needed. Constipation.   PROBIOTIC PRODUCT (PROBIOTIC PO)    Take 1 tablet by mouth daily.   RANOLAZINE (RANEXA) 500 MG 12  HR TABLET    Take 500 mg by mouth 2 (two) times daily.   RIVAROXABAN (XARELTO) 20 MG TABS    Take 20 mg by mouth daily with supper.   SULFAMETHOXAZOLE-TRIMETHOPRIM (BACTRIM DS) 800-160 MG PER TABLET    Take 1 tablet by mouth 2 (two) times daily. On til July 2014 for bone infection in arm.   TAMSULOSIN HCL (FLOMAX) 0.4 MG CAPS    Take 0.4 mg by mouth daily.   TESTOSTERONE CYPIONATE (DEPOTESTOTERONE CYPIONATE) 200 MG/ML INJECTION    Inject 200 mg into the muscle every 14 (fourteen) days.  Modified Medications   No medications on file  Discontinued Medications   No medications on file    Subjective: [name] is in for his routine followup visit. He is now completed 5 1/2 months of antibiotic therapy for Enterobacter  right wrist osteomyelitis. He has been taking trimethoprim sulfamethoxazole. He has noted a progressive maculopapular rash over the past several months that is mildly pruritic. He wonders if it is due to the antibiotic. Otherwise he states that he is doing better from the standpoint of his wrist. He is having much less pain but has not been released to start physical therapy. He had another setback after his last visit with me in April. He fell and sustained a right tibial fibular fracture and was rehospitalized.  Review of Systems: Pertinent items are noted in HPI.  Past Medical History  Diagnosis Date  . Sleep apnea     cpap machine  . Acid reflux   . A-fib   . Thyroid disease   . Low testosterone, possible hypogonadism 10/01/2012  . Lumbago   . High cholesterol   . Anxiety   . Depression   . CAD (coronary artery disease)   . Hypertension     patient states he does not have high blood pressure  . Arthritis     History  Substance Use Topics  . Smoking status: Former Smoker    Quit date: 06/05/2012  . Smokeless tobacco: Not on file  . Alcohol Use: Yes     Comment: occasional    Family History  Problem Relation Age of Onset  . CAD Father 93  . Arthritis Mother     No Known Allergies  Objective: Temp: 97.7 F (36.5 C) (06/09 1403) Temp src: Oral (06/09 1403) BP: 103/66 mmHg (06/09 1403) Pulse Rate: 80 (06/09 1403)  General: He is in good spirits eager to wheelchair Skin: Diffuse red maculopapular rash on trunk arms and upper legs Lungs: Clear Cor: Regular S1 and S2 no murmurs Abdomen: Obese, soft and nontender Extremities: Right wrist incisions healed without any evidence of active infection. His right lower leg is in a soft brace  Lab Results Lab Results  Component Value Date   CRP 1.8* 01/16/2013   Lab Results  Component Value Date   ESRSEDRATE 8 01/16/2013     Assessment: He is now completed 5 1/2 months of a planned 6 month course of antibiotics for  osteomyelitis. He recalls Dr. Amanda Pea telling him last week that bone was growing around the implanted spacers in his right wrist and that he might not need any further surgery. His rash could be an allergic reaction to his antibiotic. I will repeat inflammatory markers today which have been trending down and discuss his case with Dr. Amanda Pea. He will see me back in 2 weeks to consider stopping all antibiotics versus continuing on with something other than trimethoprim sulfamethoxazole in case he is  having an allergic reaction.  Plan: 1. Continue trimethoprim sulfamethoxazole for now 2. Repeat sedimentation rate and C-reactive protein 3. Followup in 2 weeks   Cliffton Asters, MD [facility] for Infectious Disease [facility] [phone] pager   [phone] cell 03/19/2013, 2:52 PM

## 2013-03-20 ENCOUNTER — Ambulatory Visit: Payer: Medicare Other | Admitting: Internal Medicine

## 2013-03-28 ENCOUNTER — Other Ambulatory Visit: Payer: Self-pay | Admitting: Internal Medicine

## 2013-03-28 DIAGNOSIS — T07XXXA Unspecified multiple injuries, initial encounter: Secondary | ICD-10-CM

## 2013-04-04 ENCOUNTER — Ambulatory Visit (HOSPITAL_COMMUNITY)
Admission: RE | Admit: 2013-04-04 | Discharge: 2013-04-04 | Disposition: A | Discharge status: Home / Self Care | Payer: Medicare Other | Source: Ambulatory Visit | Attending: Internal Medicine | Admitting: Internal Medicine

## 2013-04-04 ENCOUNTER — Ambulatory Visit: Payer: Medicare Other | Admitting: Internal Medicine

## 2013-04-04 DIAGNOSIS — Z8781 Personal history of (healed) traumatic fracture: Secondary | ICD-10-CM | POA: Insufficient documentation

## 2013-04-04 DIAGNOSIS — Z13820 Encounter for screening for osteoporosis: Secondary | ICD-10-CM | POA: Insufficient documentation

## 2013-04-04 DIAGNOSIS — F17200 Nicotine dependence, unspecified, uncomplicated: Secondary | ICD-10-CM | POA: Insufficient documentation

## 2013-04-04 DIAGNOSIS — T07XXXA Unspecified multiple injuries, initial encounter: Secondary | ICD-10-CM

## 2013-04-09 ENCOUNTER — Encounter: Payer: Self-pay | Admitting: Internal Medicine

## 2013-04-09 ENCOUNTER — Ambulatory Visit (INDEPENDENT_AMBULATORY_CARE_PROVIDER_SITE_OTHER): Payer: Medicare Other | Admitting: Internal Medicine

## 2013-04-09 VITALS — BP 130/76 | HR 83 | Temp 97.6°F | Ht 76.0 in | Wt 283.5 lb

## 2013-04-09 DIAGNOSIS — M869 Osteomyelitis, unspecified: Secondary | ICD-10-CM

## 2013-04-09 NOTE — Progress Notes (Signed)
Patient ID: [name], male   DOB: [birth year], 67 y.o.   MRN: [id number]         [facility] for Infectious Disease  Patient Active Problem List   Diagnosis Date Noted  . Osteomyelitis of right wrist 11/02/2012    Priority: High  . Right forearm fracture 09/29/2012    Priority: High  . Right femoral fracture 09/29/2012    Priority: Medium  . Rash and nonspecific skin eruption 03/08/2013  . Syncope 02/21/2013  . PAF (paroxysmal atrial fibrillation) 02/21/2013  . Chronic anticoagulation 02/21/2013  . Fall 02/08/2013  . Physical deconditioning 02/08/2013  . Low back pain radiating to both legs 02/08/2013  . Compression fracture of L1 lumbar vertebra 02/08/2013  . Right tibial fracture 02/05/2013  . Obesity, unspecified 02/05/2013  . Anemia 10/06/2012  . HTN (hypertension) 10/03/2012  . Psoriasis 10/03/2012  . Dyslipidemia 10/03/2012  . Vitamin D deficiency 10/03/2012  . GERD (gastroesophageal reflux disease) 10/03/2012  . Hyperglycemia 10/03/2012  . Anxiety 10/03/2012  . Depression 10/03/2012  . CAD (coronary artery disease) 10/03/2012  . DJD (degenerative joint disease) 10/03/2012  . Chronic gingivitis 10/03/2012  . Low testosterone, possible hypogonadism 10/01/2012  . Normocytic anemia 09/30/2012  . Atrial fibrillation 09/29/2012  . Hypothyroidism 09/29/2012  . History of recurrent UTIs 09/29/2012    Patient's Medications  New Prescriptions   No medications on file  Previous Medications   AMIODARONE (PACERONE) 200 MG TABLET    Take 100 mg by mouth daily.   ASCORBIC ACID (VITAMIN C PO)    Take 1 tablet by mouth daily.   ATORVASTATIN (LIPITOR) 80 MG TABLET    Take 80 mg by mouth at bedtime.    CHOLECALCIFEROL (VITAMIN D) 2000 UNITS CAPS    Take 1 capsule by mouth daily.   CLONAZEPAM (KLONOPIN) 2 MG TABLET    Take 1 tablet (2 mg total) by mouth 3 (three) times daily.   CLORAZEPATE (TRANXENE-T) 15 MG TABLET    Take 1 tablet (15 mg total) by mouth daily as  needed for anxiety.   DILTIAZEM (DILACOR XR) 240 MG 24 HR CAPSULE    Take 240 mg by mouth daily.   DULOXETINE (CYMBALTA) 60 MG CAPSULE    Take 60 mg by mouth 2 (two) times daily.   LEVOTHYROXINE (SYNTHROID, LEVOTHROID) 125 MCG TABLET    Take 2 tablets (250 mcg total) by mouth daily before breakfast.   LORATADINE (CLARITIN) 10 MG TABLET    Take 10 mg by mouth daily.   METHOCARBAMOL (ROBAXIN) 500 MG TABLET    Take 1-2 tablets (500-1,000 mg total) by mouth every 6 (six) hours as needed.   METOPROLOL TARTRATE (LOPRESSOR) 25 MG TABLET    Take 25 mg by mouth daily as needed. For increased heart rate   MULTIPLE VITAMIN (MULTIVITAMIN WITH MINERALS) TABS    Take 1 tablet by mouth daily.   NITROGLYCERIN (NITROSTAT) 0.4 MG SL TABLET    Place 0.4 mg under the tongue every 5 (five) minutes as needed for chest pain.   OMEPRAZOLE (PRILOSEC) 20 MG CAPSULE    Take 20 mg by mouth daily.   OXYCODONE-ACETAMINOPHEN (PERCOCET/ROXICET) 5-325 MG PER TABLET    Take one tablet every 6 hours as needed for pain   POLYETHYLENE GLYCOL (MIRALAX / GLYCOLAX) PACKET    Take 17 g by mouth daily as needed. Constipation.   PROBIOTIC PRODUCT (PROBIOTIC PO)    Take 1 tablet by mouth daily.   RANOLAZINE (RANEXA) 500 MG 12  HR TABLET    Take 500 mg by mouth 2 (two) times daily.   RIVAROXABAN (XARELTO) 20 MG TABS    Take 20 mg by mouth daily with supper.   TAMSULOSIN HCL (FLOMAX) 0.4 MG CAPS    Take 0.4 mg by mouth daily.   TESTOSTERONE CYPIONATE (DEPOTESTOTERONE CYPIONATE) 200 MG/ML INJECTION    Inject 200 mg into the muscle every 14 (fourteen) days.  Modified Medications   No medications on file  Discontinued Medications   SULFAMETHOXAZOLE-TRIMETHOPRIM (BACTRIM DS) 800-160 MG PER TABLET    Take 1 tablet by mouth 2 (two) times daily. On til July 2014 for bone infection in arm.    Subjective: [name] is in for his routine visit. He is now completed over 6 months of antibiotic therapy for her right wrist osteomyelitis. His wrist is  feeling much better. The mildly pruritic rash that he had 2 weeks ago turned out to be scabies. He was treated and has resolved. He's had no problems tolerating his antibiotic.  Review of Systems: Pertinent items are noted in HPI.  Past Medical History  Diagnosis Date  . Sleep apnea     cpap machine  . Acid reflux   . A-fib   . Thyroid disease   . Low testosterone, possible hypogonadism 10/01/2012  . Lumbago   . High cholesterol   . Anxiety   . Depression   . CAD (coronary artery disease)   . Hypertension     patient states he does not have high blood pressure  . Arthritis     History  Substance Use Topics  . Smoking status: Former Smoker    Quit date: 06/05/2012  . Smokeless tobacco: Not on file  . Alcohol Use: Yes     Comment: occasional    Family History  Problem Relation Age of Onset  . CAD Father 67  . Arthritis Mother     No Known Allergies  Objective: Temp: 97.6 F (36.4 C) (06/30 1446) Temp src: Oral (06/30 1446) BP: 130/76 mmHg (06/30 1446) Pulse Rate: 83 (06/30 1446)  General: He is in good spirits as usual Skin: Rash has resolved His right wrist incisions have healed and he has no signs of active infection.  Lab Results Lab Results  Component Value Date   CRP 2.3* 03/19/2013   Lab Results  Component Value Date   ESRSEDRATE 6 03/19/2013     Assessment: I think his best to stop all antibiotics at this time.  Plan: 1. Discontinue trimethoprim sulfamethoxazole 2. Followup in 6 weeks   Cliffton Asters, MD [facility] for Infectious Disease [facility] [phone] pager   [phone] cell 04/09/2013, 3:05 PM

## 2013-04-24 ENCOUNTER — Encounter: Payer: Self-pay | Admitting: Internal Medicine

## 2013-04-24 ENCOUNTER — Non-Acute Institutional Stay (SKILLED_NURSING_FACILITY): Payer: Medicare Other | Admitting: Internal Medicine

## 2013-04-24 DIAGNOSIS — M8440XA Pathological fracture, unspecified site, initial encounter for fracture: Secondary | ICD-10-CM

## 2013-04-24 DIAGNOSIS — M8000XG Age-related osteoporosis with current pathological fracture, unspecified site, subsequent encounter for fracture with delayed healing: Secondary | ICD-10-CM

## 2013-04-24 DIAGNOSIS — M8080XA Other osteoporosis with current pathological fracture, unspecified site, initial encounter for fracture: Secondary | ICD-10-CM

## 2013-04-24 DIAGNOSIS — E559 Vitamin D deficiency, unspecified: Secondary | ICD-10-CM

## 2013-04-24 DIAGNOSIS — M810 Age-related osteoporosis without current pathological fracture: Secondary | ICD-10-CM

## 2013-04-24 DIAGNOSIS — R5381 Other malaise: Secondary | ICD-10-CM

## 2013-04-24 DIAGNOSIS — M8448XD Pathological fracture, other site, subsequent encounter for fracture with routine healing: Secondary | ICD-10-CM

## 2013-04-24 DIAGNOSIS — L408 Other psoriasis: Secondary | ICD-10-CM

## 2013-04-24 DIAGNOSIS — I4891 Unspecified atrial fibrillation: Secondary | ICD-10-CM

## 2013-04-24 DIAGNOSIS — L409 Psoriasis, unspecified: Secondary | ICD-10-CM

## 2013-04-24 DIAGNOSIS — R7989 Other specified abnormal findings of blood chemistry: Secondary | ICD-10-CM

## 2013-04-24 DIAGNOSIS — E039 Hypothyroidism, unspecified: Secondary | ICD-10-CM

## 2013-04-24 DIAGNOSIS — R21 Rash and other nonspecific skin eruption: Secondary | ICD-10-CM

## 2013-04-24 DIAGNOSIS — E291 Testicular hypofunction: Secondary | ICD-10-CM

## 2013-04-24 HISTORY — DX: Other osteoporosis with current pathological fracture, unspecified site, initial encounter for fracture: M80.80XA

## 2013-04-24 HISTORY — DX: Age-related osteoporosis with current pathological fracture, unspecified site, subsequent encounter for fracture with delayed healing: M80.00XG

## 2013-04-24 NOTE — Assessment & Plan Note (Addendum)
On 2000 IU daily vitamin D.  Has osteoporosis.  F/u level now that he's been on supplementation at least since he's been here.

## 2013-04-24 NOTE — Assessment & Plan Note (Addendum)
Rate has been controlled.  He is on amiodarone low dose and now off cardizem, on metoprolol only.  Is on xarelto for anticoagulation.

## 2013-04-24 NOTE — Assessment & Plan Note (Signed)
On testosterone supplementation now.  Has osteoporosis.  Will treat this.

## 2013-04-24 NOTE — Assessment & Plan Note (Signed)
On synthroid daily.

## 2013-04-24 NOTE — Assessment & Plan Note (Signed)
Previously on enbrel for this as an outpatient.  Off due to recent problems with joint infections.  Having some flare ups of pruritis, but generally doing fairly well without it.  Was treated for scabies rash here with resolution.

## 2013-04-24 NOTE — Assessment & Plan Note (Signed)
Is making progress with therapy.  Remains a fall risk.  Meds have been revised to help decrease his burden and help decrease fall risk--would like to reduce clonazepam further if possible.  Has tolerated gradual dose reduction well thus far.

## 2013-04-24 NOTE — Progress Notes (Signed)
Patient ID: [name], male   DOB: 1946-02-23, 67 y.o.   MRN: [id number] Location:  [facility] Provider:  [name]. [name], D.O., C.M.D.  Code Status:  full Chief Complaint: med mgt chronic diseases, osteoporosis HPI: 67 yo male with h/o hypogonadism, multiple falls with fractures, polypharmacy, anxiety and depression was seen today for routine mgt of his chronic conditions.  He has had his bone density study.  He is on vitamin D supplementation.  He continues to follow with Dr. [name] due to h/o septic joints and his orthopedic surgeons.  His anxiety has been well controlled with elimination of his as needed benzo and reduction of his klonopin to 1mg  po tid.  I would like to try to reduce this further as he is up walking more with therapy using his walker and I certainly do not want him falling now.  He now needs treatment for his osteoporosis.    Review of Systems:  Review of Systems  Constitutional: Negative for fever and malaise/fatigue.       Weakness of his legs has improved significantly working with PT here  Eyes: Negative for blurred vision.  Respiratory: Negative for shortness of breath.   Cardiovascular: Negative for chest pain.  Gastrointestinal: Negative for constipation.  Genitourinary: Negative for dysuria and frequency.  Musculoskeletal: Negative for falls.       Still has some myalgias after working with PT, now improving with his walking--has some difficulty due to the heavy boot on the left foot and his foot turning out on him  Neurological: Negative for dizziness, focal weakness and headaches.  Endo/Heme/Allergies: Bruises/bleeds easily.  Psychiatric/Behavioral: Negative for depression. The patient is not nervous/anxious and does not have insomnia.      Medications: Patient's Medications  New Prescriptions   No medications on file  Previous Medications   AMIODARONE (PACERONE) 200 MG TABLET    Take 100 mg by mouth daily.   ASCORBIC ACID  (VITAMIN C PO)    Take 1 tablet by mouth daily.   ATORVASTATIN (LIPITOR) 80 MG TABLET    Take 80 mg by mouth at bedtime.    CHOLECALCIFEROL (VITAMIN D) 2000 UNITS CAPS    Take 1 capsule by mouth daily.   DULOXETINE (CYMBALTA) 60 MG CAPSULE    Take 60 mg by mouth 2 (two) times daily.   LEVOTHYROXINE (SYNTHROID, LEVOTHROID) 125 MCG TABLET    Take 2 tablets (250 mcg total) by mouth daily before breakfast.   LORATADINE (CLARITIN) 10 MG TABLET    Take 10 mg by mouth daily.   METOPROLOL TARTRATE (LOPRESSOR) 25 MG TABLET    Take 12.5 mg by mouth 2 (two) times daily. Hold for sbp < 110, hr <60   MULTIPLE VITAMIN (MULTIVITAMIN WITH MINERALS) TABS    Take 1 tablet by mouth daily.   OMEPRAZOLE (PRILOSEC) 20 MG CAPSULE    Take 20 mg by mouth daily.   OXYCODONE-ACETAMINOPHEN (PERCOCET/ROXICET) 5-325 MG PER TABLET    Take one tablet every 6 hours as needed for pain   POLYETHYLENE GLYCOL (MIRALAX / GLYCOLAX) PACKET    Take 17 g by mouth daily as needed. Constipation.   PROBIOTIC PRODUCT (PROBIOTIC PO)    Take 1 tablet by mouth daily.   RANOLAZINE (RANEXA) 500 MG 12 HR TABLET    Take 500 mg by mouth 2 (two) times daily.   RIVAROXABAN (XARELTO) 20 MG TABS    Take 20 mg by mouth daily with supper.   TAMSULOSIN HCL (FLOMAX)  0.4 MG CAPS    Take 0.4 mg by mouth daily.   TESTOSTERONE CYPIONATE (DEPOTESTOTERONE CYPIONATE) 200 MG/ML INJECTION    Inject 200 mg into the muscle every 14 (fourteen) days.  Modified Medications   Modified Medication Previous Medication   CLONAZEPAM (KLONOPIN) 2 MG TABLET clonazePAM (KLONOPIN) 2 MG tablet      Take 1 mg by mouth 3 (three) times daily.    Take 1 tablet (2 mg total) by mouth 3 (three) times daily.   METHOCARBAMOL (ROBAXIN) 500 MG TABLET methocarbamol (ROBAXIN) 500 MG tablet      Take 500 mg by mouth every 6 (six) hours as needed.    Take 1-2 tablets (500-1,000 mg total) by mouth every 6 (six) hours as needed.  Discontinued Medications   CLORAZEPATE (TRANXENE-T) 15 MG TABLET     Take 1 tablet (15 mg total) by mouth daily as needed for anxiety.   DILTIAZEM (DILACOR XR) 240 MG 24 HR CAPSULE    Take 240 mg by mouth daily.   NITROGLYCERIN (NITROSTAT) 0.4 MG SL TABLET    Place 0.4 mg under the tongue every 5 (five) minutes as needed for chest pain.    Physical Exam: Filed Vitals:   04/24/13 1522  BP: 147/77  Pulse: 72  Temp: 97.1 F (36.2 C)  Resp: 18  Height: 6\' 4"  (1.93 m)  Weight: 306 lb (138.801 kg)  Physical Exam  Constitutional: He is oriented to person, place, and time.  Overweight white male, NAD, seated in his w/c  HENT:  Head: Normocephalic and atraumatic.  Cardiovascular: Normal rate and regular rhythm.   Pulmonary/Chest: Effort normal and breath sounds normal.  Abdominal: Soft. Bowel sounds are normal.  Musculoskeletal: He exhibits no edema and no tenderness.  Right lower leg in boot, right wrist in brace  Neurological: He is alert and oriented to person, place, and time.  Skin: Skin is warm and dry.  Rash resolved, some psoriatic plaques present     Labs reviewed: Basic Metabolic Panel:  Recent Labs  96/04/54 0610 02/08/13 0431 02/09/13 0530  NA 134* 134* 134*  K 4.2 4.2 3.8  CL 99 99 97  CO2 29 29 30   GLUCOSE 137* 96 106*  BUN 15 16 16   CREATININE 0.89 0.88 0.90  CALCIUM 8.4 8.7 8.8    Liver Function Tests:  Recent Labs  02/07/13 0610 02/08/13 0431 02/09/13 0530  AST 12 11 12   ALT 12 10 11   ALKPHOS 154* 147* 145*  BILITOT 0.4 0.4 0.4  PROT 5.6* 5.7* 5.8*  ALBUMIN 2.5* 2.5* 2.5*    CBC:  Recent Labs  09/28/12 2340  02/07/13 0610 02/08/13 0431 02/09/13 0530  WBC 9.3  < > 6.9 5.8 5.3  NEUTROABS 5.9  --   --   --   --   HGB 10.8*  < > 10.2* 9.9* 9.7*  HCT 33.2*  < > 30.4* 30.1* 29.8*  MCV 82.4  < > 80.9 81.4 81.0  PLT 277  < > 264 257 254  < > = values in this interval not displayed.  Significant Diagnostic Results:  Bone density study with osteoporosis reviewed  Assessment/Plan Low testosterone,  possible hypogonadism On testosterone supplementation now.  Has osteoporosis.  Will treat this.    Physical deconditioning Is making progress with therapy.  Remains a fall risk.  Meds have been revised to help decrease his burden and help decrease fall risk--would like to reduce clonazepam further if possible.  Has tolerated gradual dose reduction  well thus far.    Atrial fibrillation Rate has been controlled.  He is on amiodarone low dose and now off cardizem, on metoprolol only.  Is on xarelto for anticoagulation.  Hypothyroidism On synthroid daily.    Psoriasis Previously on enbrel for this as an outpatient.  Off due to recent problems with joint infections.  Having some flare ups of pruritis, but generally doing fairly well without it.  Was treated for scabies rash here with resolution.    Vitamin D deficiency On 2000 IU daily vitamin D.  Has osteoporosis.  F/u level now that he's been on supplementation at least since he's been here.  Rash and nonspecific skin eruption Scabies has resolved.  Pathological fracture due to osteoporosis with delayed healing Pt has had several fractures that have been challenging to heal--has multifactorial osteoporosis.  Osteoporosis with fracture Bone density confirms osteoporosis.  He is on vitamin D supplementation.  I have ordered regular labs for pre-reclast therapy (ca, vit D, ca, phos).  His GERD is severe so he cannot tolerate oral bisphosphonates.  He also has a complex medication regimen that will make it difficult to take them orally (also on thyroid med that must be taken alone first thing in the morning).  We will then arrange for him to get a reclast infusion at the [facility].    Family/ staff Communication: discussed with pt and his husband, Casimiro Needle, today in pt's room, also reviewed with RN  Goals of care: full code  Labs/tests ordered:  Bmp, vit D, mg, phos;  reclast infusion

## 2013-04-24 NOTE — Assessment & Plan Note (Signed)
Bone density confirms osteoporosis.  He is on vitamin D supplementation.  I have ordered regular labs for pre-reclast therapy (ca, vit D, ca, phos).  His GERD is severe so he cannot tolerate oral bisphosphonates.  He also has a complex medication regimen that will make it difficult to take them orally (also on thyroid med that must be taken alone first thing in the morning).  We will then arrange for him to get a reclast infusion at the [facility] infusion center.

## 2013-04-24 NOTE — Assessment & Plan Note (Signed)
Scabies has resolved.

## 2013-04-24 NOTE — Assessment & Plan Note (Signed)
Pt has had several fractures that have been challenging to heal--has multifactorial osteoporosis.

## 2013-05-03 ENCOUNTER — Non-Acute Institutional Stay (SKILLED_NURSING_FACILITY): Payer: Medicare Other | Admitting: Internal Medicine

## 2013-05-03 ENCOUNTER — Other Ambulatory Visit: Payer: Self-pay | Admitting: Internal Medicine

## 2013-05-03 DIAGNOSIS — R7989 Other specified abnormal findings of blood chemistry: Secondary | ICD-10-CM

## 2013-05-03 DIAGNOSIS — M8080XS Other osteoporosis with current pathological fracture, unspecified site, sequela: Secondary | ICD-10-CM

## 2013-05-03 DIAGNOSIS — R5381 Other malaise: Secondary | ICD-10-CM

## 2013-05-03 DIAGNOSIS — F329 Major depressive disorder, single episode, unspecified: Secondary | ICD-10-CM

## 2013-05-03 DIAGNOSIS — K219 Gastro-esophageal reflux disease without esophagitis: Secondary | ICD-10-CM

## 2013-05-03 DIAGNOSIS — E039 Hypothyroidism, unspecified: Secondary | ICD-10-CM

## 2013-05-03 DIAGNOSIS — F3289 Other specified depressive episodes: Secondary | ICD-10-CM

## 2013-05-03 DIAGNOSIS — I4891 Unspecified atrial fibrillation: Secondary | ICD-10-CM

## 2013-05-03 DIAGNOSIS — E291 Testicular hypofunction: Secondary | ICD-10-CM

## 2013-05-03 DIAGNOSIS — I2510 Atherosclerotic heart disease of native coronary artery without angina pectoris: Secondary | ICD-10-CM

## 2013-05-03 DIAGNOSIS — F419 Anxiety disorder, unspecified: Secondary | ICD-10-CM

## 2013-05-03 DIAGNOSIS — F411 Generalized anxiety disorder: Secondary | ICD-10-CM

## 2013-05-03 DIAGNOSIS — IMO0002 Reserved for concepts with insufficient information to code with codable children: Secondary | ICD-10-CM

## 2013-05-03 NOTE — Progress Notes (Signed)
Patient ID: [name], male   DOB: 29-Dec-1945, 67 y.o.   MRN: [id number]  Chief Complaint  Patient presents with  . Discharge Note   Code status- full code  [id number] 67 y/o male patient with hx of multiple falls and fractures, polypharmacy, anxiety, depression here for STR is ready to be discharged home after completing and making improvement with therapy team here. He has outpatient therapy sessions set up by orhtopedics. He denies any complaint this visit.He continues to follow with Dr. [name] due to h/o septic joints and his orthopedic surgeons.    Review of Systems:  Review of Systems  Constitutional: Negative for fever and malaise/fatigue.        Weakness of his legs has improved significantly working with PT here  Eyes: Negative for blurred vision.  Respiratory: Negative for shortness of breath.   Cardiovascular: Negative for chest pain.  Gastrointestinal: Negative for constipation.  Genitourinary: Negative for dysuria and frequency.  Musculoskeletal: Negative for falls.   Neurological: Negative for dizziness, focal weakness and headaches.  Endo/Heme/Allergies: Bruises/bleeds easily.  Psychiatric/Behavioral: Negative for depression. The patient is not nervous/anxious and does not have insomnia.    No Known Allergies  Past Medical History  Diagnosis Date  . Sleep apnea     cpap machine  . Acid reflux   . A-fib   . Thyroid disease   . Low testosterone, possible hypogonadism 10/01/2012  . Lumbago   . High cholesterol   . Anxiety   . Depression   . CAD (coronary artery disease)   . Hypertension     patient states he does not have high blood pressure  . Arthritis     VSS  Constitutional: He is oriented to person, place, and time.  Overweight white male, NAD, seated in his wheelchair, moves around with walker and is 1005 weight bearing HENT:   Head: Normocephalic and atraumatic.  Cardiovascular: Normal rate and regular rhythm.   Pulmonary/Chest: Effort normal  and breath sounds normal.  Abdominal: Soft. Bowel sounds are normal.  Musculoskeletal: He exhibits no edema and no tenderness.  Right lower leg in boot, right wrist in brace. Also has a back brace Neurological: He is alert and oriented to person, place, and time.  Skin: Skin is warm and dry. some psoriatic plaques present   ASSESSMENT/PLAN  Patient will be discharged home with scripts for the medications. Medication list reviewed. Has follow up appointment with PCP and orthopedic  Depression Continue duloxetine for his mood  Hypothyroidism On synthroid daily.  to follow with PCP on thyroid level and dose adjustment  Hyperlipidemia Continue atorvastatin  Atrial fibrillation Rate has been controlled. continue amiodarone and metoprolol. Continue xarelto for anticoagulation.  Osteoporosis with fracture Bone density confirms osteoporosis. will get reclast infusion 05/21/13. Continue vit d supplement. Will continue prn robaxin for muscle spasm, oxycodone-apap 5-325 q6h prn. Continue miralax for constipation  Anxiety Continue klonopin for now  BPH Continue tamsulosin bid for now  GERD Continue omeprazole for now  CAD Remains chest pain free. Continue ranolazine and b blocker  Low testosterone, possible hypogonadism On testosterone supplementation   Physical deconditioning Has mad progress with therapy. wil work with therapy team in orthopedic centre. Fall precautions   Family/ staff Communication: discussed with patient and nursing supervisor

## 2013-05-16 ENCOUNTER — Other Ambulatory Visit: Payer: Self-pay

## 2013-05-21 ENCOUNTER — Encounter (HOSPITAL_COMMUNITY): Payer: Self-pay

## 2013-05-21 ENCOUNTER — Ambulatory Visit (HOSPITAL_COMMUNITY)
Admission: RE | Admit: 2013-05-21 | Discharge: 2013-05-21 | Disposition: A | Discharge status: Home / Self Care | Payer: Medicare Other | Source: Ambulatory Visit | Attending: Internal Medicine | Admitting: Internal Medicine

## 2013-05-21 ENCOUNTER — Other Ambulatory Visit (HOSPITAL_COMMUNITY): Payer: Self-pay | Admitting: Internal Medicine

## 2013-05-21 DIAGNOSIS — M810 Age-related osteoporosis without current pathological fracture: Secondary | ICD-10-CM | POA: Insufficient documentation

## 2013-05-21 DIAGNOSIS — K219 Gastro-esophageal reflux disease without esophagitis: Secondary | ICD-10-CM | POA: Insufficient documentation

## 2013-05-21 DIAGNOSIS — M84453A Pathological fracture, unspecified femur, initial encounter for fracture: Secondary | ICD-10-CM | POA: Insufficient documentation

## 2013-05-21 HISTORY — DX: Other allergy status, other than to drugs and biological substances: Z91.09

## 2013-05-21 MED ORDER — SODIUM CHLORIDE 0.9 % IV SOLN
Freq: Once | INTRAVENOUS | Status: AC
  Administered 2013-05-21: 12:00:00 via INTRAVENOUS

## 2013-05-21 MED ORDER — ZOLEDRONIC ACID 5 MG/100ML IV SOLN
5.0000 mg | Freq: Once | INTRAVENOUS | Status: AC
  Administered 2013-05-21: 5 mg via INTRAVENOUS
  Filled 2013-05-21: qty 100

## 2013-05-22 ENCOUNTER — Ambulatory Visit: Payer: Medicare Other | Admitting: Internal Medicine

## 2013-05-22 ENCOUNTER — Ambulatory Visit (INDEPENDENT_AMBULATORY_CARE_PROVIDER_SITE_OTHER): Payer: Medicare Other | Admitting: Internal Medicine

## 2013-05-22 ENCOUNTER — Encounter: Payer: Self-pay | Admitting: Internal Medicine

## 2013-05-22 VITALS — BP 121/70 | HR 87 | Temp 97.4°F | Ht 76.0 in | Wt 323.0 lb

## 2013-05-22 DIAGNOSIS — M869 Osteomyelitis, unspecified: Secondary | ICD-10-CM

## 2013-05-22 NOTE — Progress Notes (Signed)
Patient ID: [name], male   DOB: 1946/07/29, 67 y.o.   MRN: [id number]         [facility] for Infectious Disease  Patient Active Problem List   Diagnosis Date Noted  . Osteomyelitis of right wrist 11/02/2012    Priority: High  . Right forearm fracture 09/29/2012    Priority: High  . Right femoral fracture 09/29/2012    Priority: Medium  . Pathological fracture due to osteoporosis with delayed healing 04/24/2013  . Osteoporosis with fracture 04/24/2013  . Rash and nonspecific skin eruption 03/08/2013  . Syncope 02/21/2013  . PAF (paroxysmal atrial fibrillation) 02/21/2013  . Chronic anticoagulation 02/21/2013  . Fall 02/08/2013  . Physical deconditioning 02/08/2013  . Low back pain radiating to both legs 02/08/2013  . Compression fracture of L1 lumbar vertebra 02/08/2013  . Right tibial fracture 02/05/2013  . Obesity, unspecified 02/05/2013  . Anemia 10/06/2012  . HTN (hypertension) 10/03/2012  . Psoriasis 10/03/2012  . Dyslipidemia 10/03/2012  . Vitamin D deficiency 10/03/2012  . GERD (gastroesophageal reflux disease) 10/03/2012  . Hyperglycemia 10/03/2012  . Anxiety 10/03/2012  . Depression 10/03/2012  . CAD (coronary artery disease) 10/03/2012  . DJD (degenerative joint disease) 10/03/2012  . Chronic gingivitis 10/03/2012  . Low testosterone, possible hypogonadism 10/01/2012  . Normocytic anemia 09/30/2012  . Atrial fibrillation 09/29/2012  . Hypothyroidism 09/29/2012  . History of recurrent UTIs 09/29/2012    Patient's Medications  New Prescriptions   No medications on file  Previous Medications   AMIODARONE (PACERONE) 200 MG TABLET    Take 100 mg by mouth daily.   AMOXICILLIN (AMOXIL) 500 MG CAPSULE    Take 2,000 mg by mouth. Prior to dental exams   ASCORBIC ACID (VITAMIN C PO)    Take 1 tablet by mouth daily.   ATORVASTATIN (LIPITOR) 80 MG TABLET    Take 80 mg by mouth at bedtime.    CALCIUM GLUCONATE 650 MG TABLET    Take 1,200 mg by mouth  daily.   CHOLECALCIFEROL (VITAMIN D) 2000 UNITS CAPS    Take 1 capsule by mouth daily.   CLONAZEPAM (KLONOPIN) 2 MG TABLET    Take 1 mg by mouth 3 (three) times daily.   DILTIAZEM (CARDIZEM CD) 240 MG 24 HR CAPSULE       DOXYCYCLINE (VIBRAMYCIN) 100 MG CAPSULE       DULOXETINE (CYMBALTA) 60 MG CAPSULE    Take 60 mg by mouth 2 (two) times daily.   ENBREL SURECLICK 50 MG/ML INJECTION    50 mg every 14 (fourteen) days.    LEVOTHYROXINE (SYNTHROID, LEVOTHROID) 125 MCG TABLET    Take 2 tablets (250 mcg total) by mouth daily before breakfast.   LORATADINE (CLARITIN) 10 MG TABLET    Take 10 mg by mouth daily.   METHOCARBAMOL (ROBAXIN) 500 MG TABLET    Take 500 mg by mouth every 6 (six) hours as needed.   METOPROLOL TARTRATE (LOPRESSOR) 25 MG TABLET    Take 12.5 mg by mouth 2 (two) times daily. Hold for sbp < 110, hr <60   MULTIPLE VITAMIN (MULTIVITAMIN WITH MINERALS) TABS    Take 1 tablet by mouth daily.   OMEPRAZOLE (PRILOSEC) 20 MG CAPSULE    Take 20 mg by mouth daily.   OXYCODONE (OXY IR/ROXICODONE) 5 MG IMMEDIATE RELEASE TABLET       OXYCODONE-ACETAMINOPHEN (PERCOCET/ROXICET) 5-325 MG PER TABLET    Take one tablet every 6 hours as needed for pain   POLYETHYLENE GLYCOL (  MIRALAX / GLYCOLAX) PACKET    Take 17 g by mouth daily as needed. Constipation.   PROBIOTIC PRODUCT (PROBIOTIC PO)    Take 1 tablet by mouth daily.   RANOLAZINE (RANEXA) 500 MG 12 HR TABLET    Take 500 mg by mouth 2 (two) times daily.   RIVAROXABAN (XARELTO) 20 MG TABS    Take 20 mg by mouth daily with supper.   TAMSULOSIN HCL (FLOMAX) 0.4 MG CAPS    Take 0.4 mg by mouth daily.   TESTOSTERONE CYPIONATE (DEPOTESTOTERONE CYPIONATE) 200 MG/ML INJECTION    Inject 200 mg into the muscle every 14 (fourteen) days.   ZOLEDRONIC ACID (RECLAST) 5 MG/100ML SOLN INJECTION    Inject into the vein once.  Modified Medications   No medications on file  Discontinued Medications   No medications on file    Subjective: [name] is in for his  routine followup visit. He has been off of all antibiotics for the past 6 weeks after completing a little of her 6 months of therapy for Enterobacter osteomyelitis of his right wrist. He feels that his strength is improving and Dr. Amanda Lane has told him he can start 50% weightbearing of his right wrist and hand on his walker. He started back on his Enbrel injections 2 weeks ago and has already noted some improvement in his psoriasis.  Review of Systems: Pertinent items are noted in HPI.  Past Medical History  Diagnosis Date  . Sleep apnea     cpap machine  . Acid reflux   . A-fib   . Thyroid disease   . Low testosterone, possible hypogonadism 10/01/2012  . Lumbago   . High cholesterol   . Anxiety   . Depression   . CAD (coronary artery disease)   . Hypertension     patient states he does not have high blood pressure  . Arthritis   . Environmental allergies     History  Substance Use Topics  . Smoking status: Former Smoker    Quit date: 06/05/2012  . Smokeless tobacco: Not on file  . Alcohol Use: Yes     Comment: occasional    Family History  Problem Relation Age of Onset  . CAD Father 20  . Arthritis Mother     No Known Allergies  Objective: Temp: 97.4 F (36.3 C) (08/12 1342) Temp src: Oral (08/12 1342) BP: 121/70 mmHg (08/12 1342) Pulse Rate: 87 (08/12 1342)  General: He is in good spirits. He has his back brace on and his right wrist brace. Skin: His psoriasis is improving Right arm: His right wrist incisions have healed. There is no unusual swelling, redness, warmth or pain   Assessment: I suspect that his osteomyelitis has been cured and will continue observation off of antibiotics.  Plan: 1. Continue observation off of antibiotics 2. Followup here as needed   Cliffton Asters, MD [facility] for Infectious Disease [facility] [phone] pager   [phone] cell 05/22/2013, 2:02 PM

## 2013-05-30 ENCOUNTER — Other Ambulatory Visit: Payer: Self-pay | Admitting: Orthopedic Surgery

## 2013-05-30 DIAGNOSIS — M25571 Pain in right ankle and joints of right foot: Secondary | ICD-10-CM

## 2013-05-31 ENCOUNTER — Ambulatory Visit
Admission: RE | Admit: 2013-05-31 | Discharge: 2013-05-31 | Disposition: A | Discharge status: Home / Self Care | Payer: Medicare Other | Source: Ambulatory Visit | Attending: Orthopedic Surgery | Admitting: Orthopedic Surgery

## 2013-05-31 DIAGNOSIS — M25571 Pain in right ankle and joints of right foot: Secondary | ICD-10-CM

## 2013-06-13 ENCOUNTER — Other Ambulatory Visit: Payer: Self-pay | Admitting: Orthopedic Surgery

## 2013-06-13 ENCOUNTER — Other Ambulatory Visit (HOSPITAL_COMMUNITY): Payer: Self-pay | Admitting: Orthopedic Surgery

## 2013-06-13 DIAGNOSIS — M25551 Pain in right hip: Secondary | ICD-10-CM

## 2013-06-13 DIAGNOSIS — T84018A Broken internal joint prosthesis, other site, initial encounter: Secondary | ICD-10-CM

## 2013-06-14 ENCOUNTER — Other Ambulatory Visit: Payer: Self-pay | Admitting: Family Medicine

## 2013-06-14 DIAGNOSIS — R1011 Right upper quadrant pain: Secondary | ICD-10-CM

## 2013-06-14 DIAGNOSIS — R197 Diarrhea, unspecified: Secondary | ICD-10-CM

## 2013-06-15 ENCOUNTER — Ambulatory Visit
Admission: RE | Admit: 2013-06-15 | Discharge: 2013-06-15 | Disposition: A | Discharge status: Home / Self Care | Payer: Medicare Other | Source: Ambulatory Visit | Attending: Orthopedic Surgery | Admitting: Orthopedic Surgery

## 2013-06-15 ENCOUNTER — Ambulatory Visit
Admission: RE | Admit: 2013-06-15 | Discharge: 2013-06-15 | Disposition: A | Discharge status: Home / Self Care | Payer: Medicare Other | Source: Ambulatory Visit | Attending: Family Medicine | Admitting: Family Medicine

## 2013-06-15 ENCOUNTER — Other Ambulatory Visit: Payer: Self-pay | Admitting: Orthopedic Surgery

## 2013-06-15 ENCOUNTER — Other Ambulatory Visit: Payer: Self-pay | Admitting: Family Medicine

## 2013-06-15 DIAGNOSIS — R1011 Right upper quadrant pain: Secondary | ICD-10-CM

## 2013-06-15 DIAGNOSIS — M8000XG Age-related osteoporosis with current pathological fracture, unspecified site, subsequent encounter for fracture with delayed healing: Secondary | ICD-10-CM

## 2013-06-15 DIAGNOSIS — R197 Diarrhea, unspecified: Secondary | ICD-10-CM

## 2013-06-15 DIAGNOSIS — T84018A Broken internal joint prosthesis, other site, initial encounter: Secondary | ICD-10-CM

## 2013-06-15 LAB — SYNOVIAL CELL COUNT + DIFF, W/ CRYSTALS
Eosinophils-Synovial: 1 % (ref 0–1)
Lymphocytes-Synovial Fld: 39 % — ABNORMAL HIGH (ref 0–20)
Monocyte/Macrophage: 6 % — ABNORMAL LOW (ref 50–90)

## 2013-06-18 ENCOUNTER — Encounter (HOSPITAL_COMMUNITY)
Admission: RE | Admit: 2013-06-18 | Discharge: 2013-06-18 | Disposition: A | Discharge status: Home / Self Care | Payer: Medicare Other | Source: Ambulatory Visit | Attending: Orthopedic Surgery | Admitting: Orthopedic Surgery

## 2013-06-18 DIAGNOSIS — Z96649 Presence of unspecified artificial hip joint: Secondary | ICD-10-CM | POA: Insufficient documentation

## 2013-06-18 DIAGNOSIS — M25559 Pain in unspecified hip: Secondary | ICD-10-CM | POA: Insufficient documentation

## 2013-06-18 DIAGNOSIS — M25551 Pain in right hip: Secondary | ICD-10-CM

## 2013-06-18 LAB — BODY FLUID CULTURE: Gram Stain: NONE SEEN

## 2013-06-18 MED ORDER — TECHNETIUM TC 99M MEDRONATE IV KIT: 25.0000 | PACK | Freq: Once | INTRAVENOUS | Status: DC | PRN

## 2013-06-28 ENCOUNTER — Encounter (HOSPITAL_COMMUNITY): Payer: Self-pay | Admitting: Pharmacy Technician

## 2013-06-29 NOTE — Patient Instructions (Signed)
[name]  06/29/2013   Your procedure is scheduled on:  07/11/13               Surgery 100pm-330pm  Report to [facility] a    1000  AM.  Call this number if you have problems the morning of surgery: [phone]   Remember:   Do not eat food after midnite.               May have clear liquids until 0630am then npo.    Take these medicines the morning of surgery with A SIP OF WATER:    Do not wear jewelry,   Do not wear lotions, powders, or perfumes.   Do not shave 48 hours prior to surgery. Men may shave face and neck.  Do not bring valuables to the hospital.  Contacts, dentures or bridgework may not be worn into surgery.  Leave suitcase in the car. After surgery it may be brought to your room.  For patients admitted to the hospital, checkout time is 11:00 AM the day of  discharge.     SEE CHG INSTRUCTION SHEET    Please read over the following fact sheets that you were given: MRSA Information, coughing and deep breathing exercises, leg exercises, Blood Transfusion Fact sheet, Incentive Spirometry fact sheet               Failure to comply with these instructions may result in cancellation of your surgery.                Patient Signature ____________________________              Nurse Signature _____________________________

## 2013-07-02 ENCOUNTER — Encounter (HOSPITAL_COMMUNITY): Payer: Self-pay | Admitting: *Deleted

## 2013-07-02 ENCOUNTER — Inpatient Hospital Stay (HOSPITAL_COMMUNITY)
Admission: RE | Admit: 2013-07-02 | Discharge: 2013-07-02 | Disposition: A | Discharge status: Home / Self Care | Payer: Medicare Other | Source: Ambulatory Visit

## 2013-07-02 MED ORDER — ACETAMINOPHEN 500 MG PO TABS
1000.0000 mg | ORAL_TABLET | Freq: Once | ORAL | Status: AC
  Administered 2013-07-03: 1000 mg via ORAL
  Filled 2013-07-02: qty 2

## 2013-07-02 MED ORDER — CHLORHEXIDINE GLUCONATE 4 % EX LIQD: 60.0000 mL | Freq: Once | CUTANEOUS | Status: DC

## 2013-07-02 MED ORDER — CEFAZOLIN SODIUM 10 G IJ SOLR
3.0000 g | INTRAMUSCULAR | Status: AC
  Administered 2013-07-03: 3 g via INTRAVENOUS
  Filled 2013-07-02: qty 3000

## 2013-07-02 NOTE — Progress Notes (Signed)
Spoke with [name], pt's significant other for health hx and pre-op info.

## 2013-07-02 NOTE — H&P (Signed)
[facility] H&P  Chief Complaint: R tibial shaft nonunion  HPI:   67 y/o male with multiple medical problems and multiple chronic ortho issues presents this hospital admission for repair of R tibia nonunion.  Pt also with hypogonadism/testosterone deficiency as well and is also on vitamin D deficiency which are contributing to prolonged healing course and multiple fxs   Past Medical History  Diagnosis Date  . Sleep apnea     cpap machine  . Acid reflux   . A-fib   . Thyroid disease   . Low testosterone, possible hypogonadism 10/01/2012  . Lumbago   . High cholesterol   . Anxiety   . Depression   . CAD (coronary artery disease)   . Hypertension     patient states he does not have high blood pressure  . Arthritis   . Environmental allergies     Past Surgical History  Procedure Laterality Date  . Knee surgery  2012  . Appendectomy    . Right femur surgery  05/2012  . Orif wrist fracture  10/02/2012    Procedure: OPEN REDUCTION INTERNAL FIXATION (ORIF) WRIST FRACTURE;  Surgeon: Dominica Severin, MD;  Location: [facility];  Service: Orthopedics;  Laterality: Right;  WITH   ANTIBIOTIC  CEMENT  . Total hip revision  10/05/2012    Procedure: TOTAL HIP REVISION;  Surgeon: Shelda Pal, MD;  Location: [facility] OR[facility];  Service: Orthopedics;  Laterality: Right;  RIGHT TOTAL HIP REVISION  . Hardware removal  10/05/2012    Procedure: HARDWARE REMOVAL;  Surgeon: Shelda Pal, MD;  Location: [facility];  Service: Orthopedics;  Laterality: Right;  REMOVING  STRYKER  GAMMA NAIL  . Wrist fracture surgery  05/2012  . Orif tibia fracture Right 02/06/2013    Procedure: OPEN REDUCTION INTERNAL FIXATION (ORIF) TIBIA FRACTURE WITH IM ROD FIBULA;  Surgeon: Budd Palmer, MD;  Location: [facility] OR;  Service: Orthopedics;  Laterality: Right;  . Joint replacement      hip  . Cardiac catheterization  2013    Family History  Problem Relation Age of Onset  . CAD Father 50  . Arthritis Mother    Social  History:  reports that he quit smoking about 12 months ago. He has never used smokeless tobacco. He reports that  drinks alcohol. He reports that he does not use illicit drugs.  Allergies: No Known Allergies  No prescriptions prior to admission    No results found for this or any previous visit (from the past 48 hour(s)). No results found.  Review of Systems  Constitutional: Positive for malaise/fatigue. Negative for fever, chills and weight loss.  HENT: Negative for hearing loss.   Eyes: Negative for blurred vision.  Respiratory: Negative for shortness of breath.   Cardiovascular: Negative for chest pain and palpitations.  Gastrointestinal: Negative for nausea, vomiting and abdominal pain.  Genitourinary: Negative for dysuria.  Musculoskeletal:       Right leg and hip pain, back pain  Neurological: Negative for tingling, sensory change and headaches.    There were no vitals taken for this visit. Physical Exam  Constitutional: He is cooperative.  Obese male, tired appearing. pleasant  Cardiovascular: Normal rate, regular rhythm, S1 normal and S2 normal.   Respiratory:  Clear B   GI:  + BS, NT  Musculoskeletal:  Right Lower Extremity   Healed surgical wound lower leg   + swelling distally     + inc temp R lower leg    No  erythema or signs of infection     + motion at fx site    Distal motor and sensory functions grossly intact     + DP pulse  Neurological: He is alert.     Assessment/Plan  67 y/o male with R tibial nonunion  OR for repair of R tibial nonunion, ROH R tibia Expand workup for persistent nonunions/multiple fxs  Outside labs obtained last week show persistent testosterone deficiency despite T therapy  FSH and LH are both low, this may be due to negative feedback from therapy or may represent secondary hypogonadism  Will also check labs to evaluate for monoclonal gammopathy including serum and urinary protein electrophoresis.   Multiple Myeloma is on the  differential at this point given lack of progression to consolidate his fxs. Need to consider more atypical etiologies at this point.    Mearl Latin, PA-C [facility] [phone] (P) 07/02/2013 9:49 PM

## 2013-07-03 ENCOUNTER — Inpatient Hospital Stay (HOSPITAL_COMMUNITY): Payer: Medicare Other | Admitting: Anesthesiology

## 2013-07-03 ENCOUNTER — Inpatient Hospital Stay (HOSPITAL_COMMUNITY): Payer: Medicare Other

## 2013-07-03 ENCOUNTER — Inpatient Hospital Stay (HOSPITAL_COMMUNITY)
Admission: RE | Admit: 2013-07-03 | Discharge: 2013-07-06 | DRG: 493 | Disposition: A | Discharge status: Home / Self Care | Payer: Medicare Other | Source: Ambulatory Visit | Attending: Orthopedic Surgery | Admitting: Orthopedic Surgery

## 2013-07-03 ENCOUNTER — Encounter (HOSPITAL_COMMUNITY)
Admission: RE | Disposition: A | Discharge status: Home / Self Care | Payer: Self-pay | Source: Ambulatory Visit | Attending: Orthopedic Surgery

## 2013-07-03 ENCOUNTER — Encounter (HOSPITAL_COMMUNITY): Payer: Self-pay | Admitting: Anesthesiology

## 2013-07-03 ENCOUNTER — Observation Stay (HOSPITAL_COMMUNITY): Payer: Medicare Other

## 2013-07-03 DIAGNOSIS — Z87440 Personal history of urinary (tract) infections: Secondary | ICD-10-CM | POA: Diagnosis present

## 2013-07-03 DIAGNOSIS — I4811 Longstanding persistent atrial fibrillation: Secondary | ICD-10-CM | POA: Diagnosis present

## 2013-07-03 DIAGNOSIS — T84498A Other mechanical complication of other internal orthopedic devices, implants and grafts, initial encounter: Principal | ICD-10-CM | POA: Diagnosis present

## 2013-07-03 DIAGNOSIS — R5381 Other malaise: Secondary | ICD-10-CM | POA: Diagnosis present

## 2013-07-03 DIAGNOSIS — E669 Obesity, unspecified: Secondary | ICD-10-CM | POA: Diagnosis present

## 2013-07-03 DIAGNOSIS — Z96649 Presence of unspecified artificial hip joint: Secondary | ICD-10-CM

## 2013-07-03 DIAGNOSIS — Y831 Surgical operation with implant of artificial internal device as the cause of abnormal reaction of the patient, or of later complication, without mention of misadventure at the time of the procedure: Secondary | ICD-10-CM | POA: Diagnosis present

## 2013-07-03 DIAGNOSIS — E039 Hypothyroidism, unspecified: Secondary | ICD-10-CM | POA: Diagnosis present

## 2013-07-03 DIAGNOSIS — M8080XA Other osteoporosis with current pathological fracture, unspecified site, initial encounter for fracture: Secondary | ICD-10-CM

## 2013-07-03 DIAGNOSIS — S32010A Wedge compression fracture of first lumbar vertebra, initial encounter for closed fracture: Secondary | ICD-10-CM

## 2013-07-03 DIAGNOSIS — B964 Proteus (mirabilis) (morganii) as the cause of diseases classified elsewhere: Secondary | ICD-10-CM | POA: Diagnosis present

## 2013-07-03 DIAGNOSIS — IMO0002 Reserved for concepts with insufficient information to code with codable children: Secondary | ICD-10-CM

## 2013-07-03 DIAGNOSIS — M810 Age-related osteoporosis without current pathological fracture: Secondary | ICD-10-CM | POA: Diagnosis present

## 2013-07-03 DIAGNOSIS — M869 Osteomyelitis, unspecified: Secondary | ICD-10-CM | POA: Diagnosis present

## 2013-07-03 DIAGNOSIS — N390 Urinary tract infection, site not specified: Secondary | ICD-10-CM | POA: Diagnosis present

## 2013-07-03 DIAGNOSIS — I10 Essential (primary) hypertension: Secondary | ICD-10-CM | POA: Diagnosis present

## 2013-07-03 DIAGNOSIS — R7989 Other specified abnormal findings of blood chemistry: Secondary | ICD-10-CM | POA: Diagnosis present

## 2013-07-03 DIAGNOSIS — K219 Gastro-esophageal reflux disease without esophagitis: Secondary | ICD-10-CM | POA: Diagnosis present

## 2013-07-03 DIAGNOSIS — I2510 Atherosclerotic heart disease of native coronary artery without angina pectoris: Secondary | ICD-10-CM | POA: Diagnosis present

## 2013-07-03 DIAGNOSIS — I4891 Unspecified atrial fibrillation: Secondary | ICD-10-CM | POA: Diagnosis present

## 2013-07-03 DIAGNOSIS — M8000XG Age-related osteoporosis with current pathological fracture, unspecified site, subsequent encounter for fracture with delayed healing: Secondary | ICD-10-CM

## 2013-07-03 HISTORY — PX: ORIF TIBIA FRACTURE: SHX5416

## 2013-07-03 HISTORY — PX: HARDWARE REMOVAL: SHX979

## 2013-07-03 HISTORY — DX: Collapsed vertebra, not elsewhere classified, site unspecified, initial encounter for fracture: M48.50XA

## 2013-07-03 LAB — CBC WITH DIFFERENTIAL/PLATELET
Eosinophils Absolute: 0.3 10*3/uL (ref 0.0–0.7)
Eosinophils Relative: 5 % (ref 0–5)
HCT: 38.5 % — ABNORMAL LOW (ref 39.0–52.0)
Hemoglobin: 12 g/dL — ABNORMAL LOW (ref 13.0–17.0)
Lymphocytes Relative: 32 % (ref 12–46)
Lymphs Abs: 1.8 10*3/uL (ref 0.7–4.0)
MCH: 23.8 pg — ABNORMAL LOW (ref 26.0–34.0)
MCV: 76.4 fL — ABNORMAL LOW (ref 78.0–100.0)
Monocytes Relative: 9 % (ref 3–12)
Platelets: 268 10*3/uL (ref 150–400)
RBC: 5.04 MIL/uL (ref 4.22–5.81)
WBC: 5.6 10*3/uL (ref 4.0–10.5)

## 2013-07-03 LAB — BASIC METABOLIC PANEL
BUN: 21 mg/dL (ref 6–23)
CO2: 24 mEq/L (ref 19–32)
Calcium: 8.6 mg/dL (ref 8.4–10.5)
Creatinine, Ser: 0.9 mg/dL (ref 0.50–1.35)
GFR calc Af Amer: >90 mL/min (ref >90)
GFR calc non Af Amer: 86 mL/min — ABNORMAL LOW (ref >90)
Glucose, Bld: 98 mg/dL (ref 70–99)
Potassium: 4.1 mEq/L (ref 3.5–5.1)

## 2013-07-03 LAB — URINALYSIS, ROUTINE W REFLEX MICROSCOPIC
Bilirubin Urine: NEGATIVE
Glucose, UA: NEGATIVE mg/dL
Hgb urine dipstick: NEGATIVE
Specific Gravity, Urine: 1.025 (ref 1.005–1.030)
Urobilinogen, UA: 1 mg/dL (ref 0.0–1.0)
pH: 8 (ref 5.0–8.0)

## 2013-07-03 LAB — PROTIME-INR: INR: 1.07 (ref 0.00–1.49)

## 2013-07-03 LAB — CREATININE, SERUM
Creatinine, Ser: 0.83 mg/dL (ref 0.50–1.35)
GFR calc Af Amer: >90 mL/min (ref >90)
GFR calc non Af Amer: 89 mL/min — ABNORMAL LOW (ref >90)

## 2013-07-03 LAB — TYPE AND SCREEN
ABO/RH(D): A POS
Antibody Screen: NEGATIVE

## 2013-07-03 LAB — TSH: TSH: 3.363 u[IU]/mL (ref 0.350–4.500)

## 2013-07-03 LAB — RAPID URINE DRUG SCREEN, HOSP PERFORMED
Amphetamines: NOT DETECTED
Opiates: NOT DETECTED

## 2013-07-03 LAB — CBC
MCHC: 32.1 g/dL (ref 30.0–36.0)
Platelets: 256 10*3/uL (ref 150–400)
RBC: 4.7 MIL/uL (ref 4.22–5.81)
WBC: 9.1 10*3/uL (ref 4.0–10.5)

## 2013-07-03 LAB — URINE MICROSCOPIC-ADD ON

## 2013-07-03 LAB — GRAM STAIN: Gram Stain: NONE SEEN

## 2013-07-03 LAB — PROLACTIN: Prolactin: 9.1 ng/mL (ref 2.1–17.1)

## 2013-07-03 SURGERY — OPEN REDUCTION INTERNAL FIXATION (ORIF) TIBIA FRACTURE
Anesthesia: General | Site: Leg Lower | Laterality: Right | Wound class: Clean

## 2013-07-03 MED ORDER — VITAMIN C 500 MG PO TABS
1000.0000 mg | ORAL_TABLET | Freq: Every day | ORAL | Status: DC
  Administered 2013-07-03 – 2013-07-06 (×4): 1000 mg via ORAL
  Filled 2013-07-03 (×4): qty 2

## 2013-07-03 MED ORDER — DOCUSATE SODIUM 100 MG PO CAPS
100.0000 mg | ORAL_CAPSULE | Freq: Two times a day (BID) | ORAL | Status: DC
  Administered 2013-07-03 – 2013-07-06 (×6): 100 mg via ORAL
  Filled 2013-07-03 (×8): qty 1

## 2013-07-03 MED ORDER — ROCURONIUM BROMIDE 100 MG/10ML IV SOLN
INTRAVENOUS | Status: DC | PRN
  Administered 2013-07-03: 50 mg via INTRAVENOUS

## 2013-07-03 MED ORDER — VECURONIUM BROMIDE 10 MG IV SOLR
INTRAVENOUS | Status: DC | PRN
  Administered 2013-07-03 (×6): 1 mg via INTRAVENOUS
  Administered 2013-07-03: 2 mg via INTRAVENOUS
  Administered 2013-07-03 (×2): 1 mg via INTRAVENOUS

## 2013-07-03 MED ORDER — HYDROMORPHONE HCL PF 1 MG/ML IJ SOLN
INTRAMUSCULAR | Status: AC
  Filled 2013-07-03: qty 1

## 2013-07-03 MED ORDER — HYDROMORPHONE HCL PF 1 MG/ML IJ SOLN
0.2500 mg | INTRAMUSCULAR | Status: DC | PRN
  Administered 2013-07-03 (×4): 0.5 mg via INTRAVENOUS

## 2013-07-03 MED ORDER — METHOCARBAMOL 500 MG PO TABS
ORAL_TABLET | ORAL | Status: AC
  Filled 2013-07-03: qty 2

## 2013-07-03 MED ORDER — CALCIUM CARBONATE 1250 (500 CA) MG PO TABS
1.0000 | ORAL_TABLET | Freq: Every day | ORAL | Status: DC
  Administered 2013-07-03 – 2013-07-06 (×4): 500 mg via ORAL
  Filled 2013-07-03 (×5): qty 1

## 2013-07-03 MED ORDER — LEVOTHYROXINE SODIUM 125 MCG PO TABS
250.0000 ug | ORAL_TABLET | Freq: Every day | ORAL | Status: DC
  Administered 2013-07-04 – 2013-07-06 (×3): 250 ug via ORAL
  Filled 2013-07-03 (×4): qty 2

## 2013-07-03 MED ORDER — RANOLAZINE ER 500 MG PO TB12
500.0000 mg | ORAL_TABLET | Freq: Two times a day (BID) | ORAL | Status: DC
  Administered 2013-07-03 – 2013-07-06 (×6): 500 mg via ORAL
  Filled 2013-07-03 (×7): qty 1

## 2013-07-03 MED ORDER — ATORVASTATIN CALCIUM 80 MG PO TABS
80.0000 mg | ORAL_TABLET | Freq: Every evening | ORAL | Status: DC
  Administered 2013-07-03 – 2013-07-05 (×3): 80 mg via ORAL
  Filled 2013-07-03 (×4): qty 1

## 2013-07-03 MED ORDER — ONDANSETRON HCL 4 MG/2ML IJ SOLN
INTRAMUSCULAR | Status: DC | PRN
  Administered 2013-07-03: 4 mg via INTRAVENOUS

## 2013-07-03 MED ORDER — OXYCODONE HCL 5 MG PO TABS
5.0000 mg | ORAL_TABLET | ORAL | Status: DC | PRN
  Administered 2013-07-03 – 2013-07-06 (×14): 10 mg via ORAL
  Filled 2013-07-03 (×13): qty 2

## 2013-07-03 MED ORDER — ONDANSETRON HCL 4 MG/2ML IJ SOLN: 4.0000 mg | Freq: Four times a day (QID) | INTRAMUSCULAR | Status: DC | PRN

## 2013-07-03 MED ORDER — ONDANSETRON HCL 4 MG PO TABS: 4.0000 mg | ORAL_TABLET | Freq: Four times a day (QID) | ORAL | Status: DC | PRN

## 2013-07-03 MED ORDER — ALBUMIN HUMAN 5 % IV SOLN
INTRAVENOUS | Status: DC | PRN
  Administered 2013-07-03: 09:00:00 via INTRAVENOUS

## 2013-07-03 MED ORDER — METHOCARBAMOL 100 MG/ML IJ SOLN
500.0000 mg | Freq: Four times a day (QID) | INTRAVENOUS | Status: DC | PRN
  Filled 2013-07-03: qty 10

## 2013-07-03 MED ORDER — VITAMIN D3 25 MCG (1000 UNIT) PO TABS
2000.0000 [IU] | ORAL_TABLET | Freq: Every day | ORAL | Status: DC
  Administered 2013-07-03 – 2013-07-06 (×4): 2000 [IU] via ORAL
  Filled 2013-07-03 (×5): qty 2

## 2013-07-03 MED ORDER — TAMSULOSIN HCL 0.4 MG PO CAPS
0.4000 mg | ORAL_CAPSULE | Freq: Every evening | ORAL | Status: DC
  Administered 2013-07-03 – 2013-07-05 (×3): 0.4 mg via ORAL
  Filled 2013-07-03 (×4): qty 1

## 2013-07-03 MED ORDER — DEXTROSE 5 % IV SOLN
1.0000 g | INTRAVENOUS | Status: DC
  Administered 2013-07-04 – 2013-07-05 (×2): 1 g via INTRAVENOUS
  Filled 2013-07-03 (×3): qty 10

## 2013-07-03 MED ORDER — VITAMIN D 50 MCG (2000 UT) PO CAPS: 1.0000 | ORAL_CAPSULE | Freq: Every day | ORAL | Status: DC

## 2013-07-03 MED ORDER — PANTOPRAZOLE SODIUM 40 MG PO TBEC
40.0000 mg | DELAYED_RELEASE_TABLET | Freq: Every day | ORAL | Status: DC
  Administered 2013-07-03 – 2013-07-06 (×4): 40 mg via ORAL
  Filled 2013-07-03 (×4): qty 1

## 2013-07-03 MED ORDER — CALCIUM CARBONATE 1500 (600 CA) MG PO TABS: 1.0000 | ORAL_TABLET | Freq: Every day | ORAL | Status: DC

## 2013-07-03 MED ORDER — LIDOCAINE HCL (CARDIAC) 20 MG/ML IV SOLN
INTRAVENOUS | Status: DC | PRN
  Administered 2013-07-03: 100 mg via INTRAVENOUS

## 2013-07-03 MED ORDER — MIDAZOLAM HCL 5 MG/5ML IJ SOLN
INTRAMUSCULAR | Status: DC | PRN
  Administered 2013-07-03 (×2): 1 mg via INTRAVENOUS

## 2013-07-03 MED ORDER — OXYCODONE HCL 5 MG PO TABS: 5.0000 mg | ORAL_TABLET | Freq: Once | ORAL | Status: DC | PRN

## 2013-07-03 MED ORDER — OXYCODONE HCL 5 MG PO TABS
ORAL_TABLET | ORAL | Status: AC
  Filled 2013-07-03: qty 2

## 2013-07-03 MED ORDER — METOCLOPRAMIDE HCL 10 MG PO TABS: 5.0000 mg | ORAL_TABLET | Freq: Three times a day (TID) | ORAL | Status: DC | PRN

## 2013-07-03 MED ORDER — OXYCODONE-ACETAMINOPHEN 5-325 MG PO TABS
1.0000 | ORAL_TABLET | ORAL | Status: DC | PRN
  Administered 2013-07-03 – 2013-07-06 (×7): 2 via ORAL
  Filled 2013-07-03 (×7): qty 2

## 2013-07-03 MED ORDER — HYDROMORPHONE HCL PF 1 MG/ML IJ SOLN
0.5000 mg | INTRAMUSCULAR | Status: DC | PRN
  Administered 2013-07-03 – 2013-07-04 (×5): 1 mg via INTRAVENOUS
  Filled 2013-07-03 (×5): qty 1

## 2013-07-03 MED ORDER — POLYETHYLENE GLYCOL 3350 17 G PO PACK
17.0000 g | PACK | Freq: Every day | ORAL | Status: DC
Start: 2013-07-03 — End: 2013-07-06
  Administered 2013-07-03 – 2013-07-06 (×4): 17 g via ORAL
  Filled 2013-07-03 (×5): qty 1

## 2013-07-03 MED ORDER — ADULT MULTIVITAMIN W/MINERALS CH
1.0000 | ORAL_TABLET | Freq: Every day | ORAL | Status: DC
  Administered 2013-07-03 – 2013-07-06 (×4): 1 via ORAL
  Filled 2013-07-03 (×4): qty 1

## 2013-07-03 MED ORDER — PROPOFOL 10 MG/ML IV BOLUS
INTRAVENOUS | Status: DC | PRN
  Administered 2013-07-03: 200 mg via INTRAVENOUS

## 2013-07-03 MED ORDER — POTASSIUM CHLORIDE IN NACL 20-0.9 MEQ/L-% IV SOLN
INTRAVENOUS | Status: DC
  Administered 2013-07-03 – 2013-07-04 (×2): via INTRAVENOUS
  Filled 2013-07-03 (×6): qty 1000

## 2013-07-03 MED ORDER — LACTATED RINGERS IV SOLN
INTRAVENOUS | Status: DC
  Administered 2013-07-03: 08:00:00 via INTRAVENOUS

## 2013-07-03 MED ORDER — NITROGLYCERIN 0.4 MG SL SUBL: 0.4000 mg | SUBLINGUAL_TABLET | SUBLINGUAL | Status: DC | PRN

## 2013-07-03 MED ORDER — 0.9 % SODIUM CHLORIDE (POUR BTL) OPTIME
TOPICAL | Status: DC | PRN
  Administered 2013-07-03: 1000 mL

## 2013-07-03 MED ORDER — DULOXETINE HCL 60 MG PO CPEP
60.0000 mg | ORAL_CAPSULE | Freq: Two times a day (BID) | ORAL | Status: DC
  Administered 2013-07-03 – 2013-07-06 (×6): 60 mg via ORAL
  Filled 2013-07-03 (×7): qty 1

## 2013-07-03 MED ORDER — CLONAZEPAM 1 MG PO TABS
1.0000 mg | ORAL_TABLET | Freq: Three times a day (TID) | ORAL | Status: DC
  Administered 2013-07-03 – 2013-07-06 (×9): 1 mg via ORAL
  Filled 2013-07-03 (×9): qty 1

## 2013-07-03 MED ORDER — RIVAROXABAN 20 MG PO TABS
20.0000 mg | ORAL_TABLET | Freq: Every day | ORAL | Status: DC
  Administered 2013-07-03 – 2013-07-05 (×3): 20 mg via ORAL
  Filled 2013-07-03 (×4): qty 1

## 2013-07-03 MED ORDER — ARTIFICIAL TEARS OP OINT
TOPICAL_OINTMENT | OPHTHALMIC | Status: DC | PRN
  Administered 2013-07-03: 1 via OPHTHALMIC

## 2013-07-03 MED ORDER — INFLUENZA VAC SPLIT QUAD 0.5 ML IM SUSP
0.5000 mL | INTRAMUSCULAR | Status: AC
  Filled 2013-07-03: qty 0.5

## 2013-07-03 MED ORDER — METHOCARBAMOL 500 MG PO TABS
500.0000 mg | ORAL_TABLET | Freq: Four times a day (QID) | ORAL | Status: DC | PRN
  Administered 2013-07-03 – 2013-07-04 (×4): 1000 mg via ORAL
  Administered 2013-07-05 – 2013-07-06 (×4): 500 mg via ORAL
  Administered 2013-07-06: 1000 mg via ORAL
  Filled 2013-07-03 (×2): qty 2
  Filled 2013-07-03: qty 1
  Filled 2013-07-03: qty 2
  Filled 2013-07-03 (×3): qty 1
  Filled 2013-07-03: qty 2
  Filled 2013-07-03: qty 1

## 2013-07-03 MED ORDER — METOCLOPRAMIDE HCL 5 MG/ML IJ SOLN: 5.0000 mg | Freq: Three times a day (TID) | INTRAMUSCULAR | Status: DC | PRN

## 2013-07-03 MED ORDER — NEOSTIGMINE METHYLSULFATE 1 MG/ML IJ SOLN
INTRAMUSCULAR | Status: DC | PRN
  Administered 2013-07-03: 5 mg via INTRAVENOUS

## 2013-07-03 MED ORDER — AMIODARONE HCL 100 MG PO TABS
100.0000 mg | ORAL_TABLET | Freq: Every day | ORAL | Status: DC
  Administered 2013-07-04 – 2013-07-06 (×3): 100 mg via ORAL
  Filled 2013-07-03 (×3): qty 1

## 2013-07-03 MED ORDER — DIPHENHYDRAMINE HCL 12.5 MG/5ML PO ELIX: 12.5000 mg | ORAL_SOLUTION | ORAL | Status: DC | PRN

## 2013-07-03 MED ORDER — DILTIAZEM HCL ER COATED BEADS 240 MG PO CP24
240.0000 mg | ORAL_CAPSULE | Freq: Every morning | ORAL | Status: DC
  Administered 2013-07-04 – 2013-07-06 (×3): 240 mg via ORAL
  Filled 2013-07-03 (×3): qty 1

## 2013-07-03 MED ORDER — OXYCODONE HCL 5 MG/5ML PO SOLN: 5.0000 mg | Freq: Once | ORAL | Status: DC | PRN

## 2013-07-03 MED ORDER — CEFAZOLIN SODIUM-DEXTROSE 2-3 GM-% IV SOLR
2.0000 g | Freq: Four times a day (QID) | INTRAVENOUS | Status: AC
Start: 2013-07-03 — End: 2013-07-04
  Administered 2013-07-03 – 2013-07-04 (×3): 2 g via INTRAVENOUS
  Filled 2013-07-03 (×3): qty 50

## 2013-07-03 MED ORDER — FENTANYL CITRATE 0.05 MG/ML IJ SOLN
INTRAMUSCULAR | Status: DC | PRN
  Administered 2013-07-03 (×2): 50 ug via INTRAVENOUS
  Administered 2013-07-03: 100 ug via INTRAVENOUS
  Administered 2013-07-03 (×2): 50 ug via INTRAVENOUS

## 2013-07-03 MED ORDER — LACTATED RINGERS IV SOLN
INTRAVENOUS | Status: DC | PRN
  Administered 2013-07-03 (×3): via INTRAVENOUS

## 2013-07-03 MED ORDER — GLYCOPYRROLATE 0.2 MG/ML IJ SOLN
INTRAMUSCULAR | Status: DC | PRN
  Administered 2013-07-03: 0.2 mg via INTRAVENOUS
  Administered 2013-07-03: .7 mg via INTRAVENOUS

## 2013-07-03 SURGICAL SUPPLY — 109 items
BANDAGE ELASTIC 4 VELCRO ST LF (GAUZE/BANDAGES/DRESSINGS) ×2 IMPLANT
BANDAGE ELASTIC 6 VELCRO ST LF (GAUZE/BANDAGES/DRESSINGS) ×2 IMPLANT
BANDAGE ESMARK 6X9 LF (GAUZE/BANDAGES/DRESSINGS) ×1 IMPLANT
BANDAGE GAUZE ELAST BULKY 4 IN (GAUZE/BANDAGES/DRESSINGS) ×3 IMPLANT
BIT DRILL 2.5X2.75 QC CALB (BIT) ×1 IMPLANT
BIT DRILL CALIBRATED 2.7 (BIT) ×1 IMPLANT
BLADE SURG 10 STRL SS (BLADE) ×2 IMPLANT
BLADE SURG 15 STRL LF DISP TIS (BLADE) ×1 IMPLANT
BLADE SURG 15 STRL SS (BLADE) ×2
BLADE SURG ROTATE 9660 (MISCELLANEOUS) IMPLANT
BNDG CMPR 9X6 STRL LF SNTH (GAUZE/BANDAGES/DRESSINGS)
BNDG COHESIVE 4X5 TAN STRL (GAUZE/BANDAGES/DRESSINGS) ×1 IMPLANT
BNDG COHESIVE 6X5 TAN STRL LF (GAUZE/BANDAGES/DRESSINGS) ×1 IMPLANT
BNDG ESMARK 6X9 LF (GAUZE/BANDAGES/DRESSINGS)
BONE CANC CHIPS 40CC CAN1/2 (Bone Implant) ×2 IMPLANT
BRUSH SCRUB DISP (MISCELLANEOUS) ×4 IMPLANT
CHIPS CANC BONE 40CC CAN1/2 (Bone Implant) ×1 IMPLANT
CLEANER TIP ELECTROSURG 2X2 (MISCELLANEOUS) ×1 IMPLANT
CLOTH BEACON ORANGE TIMEOUT ST (SAFETY) ×1 IMPLANT
COVER MAYO STAND STRL (DRAPES) ×2 IMPLANT
COVER SURGICAL LIGHT HANDLE (MISCELLANEOUS) ×3 IMPLANT
CUFF TOURNIQUET SINGLE 18IN (TOURNIQUET CUFF) IMPLANT
CUFF TOURNIQUET SINGLE 24IN (TOURNIQUET CUFF) IMPLANT
CUFF TOURNIQUET SINGLE 34IN LL (TOURNIQUET CUFF) ×1 IMPLANT
DRAPE C-ARM 42X72 X-RAY (DRAPES) ×2 IMPLANT
DRAPE C-ARMOR (DRAPES) ×2 IMPLANT
DRAPE INCISE IOBAN 66X45 STRL (DRAPES) ×2 IMPLANT
DRAPE OEC MINIVIEW 54X84 (DRAPES) ×2 IMPLANT
DRAPE ORTHO SPLIT 77X108 STRL (DRAPES)
DRAPE SURG ORHT 6 SPLT 77X108 (DRAPES) IMPLANT
DRAPE U-SHAPE 47X51 STRL (DRAPES) ×2 IMPLANT
DRSG ADAPTIC 3X8 NADH LF (GAUZE/BANDAGES/DRESSINGS) ×2 IMPLANT
DRSG PAD ABDOMINAL 8X10 ST (GAUZE/BANDAGES/DRESSINGS) ×7 IMPLANT
ELECT REM PT RETURN 9FT ADLT (ELECTROSURGICAL) ×2
ELECTRODE REM PT RTRN 9FT ADLT (ELECTROSURGICAL) ×1 IMPLANT
EVACUATOR 1/8 PVC DRAIN (DRAIN) IMPLANT
EVACUATOR 3/16  PVC DRAIN (DRAIN)
EVACUATOR 3/16 PVC DRAIN (DRAIN) IMPLANT
GLOVE BIO SURGEON STRL SZ7.5 (GLOVE) ×2 IMPLANT
GLOVE BIO SURGEON STRL SZ8 (GLOVE) ×3 IMPLANT
GLOVE BIOGEL PI IND STRL 7.5 (GLOVE) ×1 IMPLANT
GLOVE BIOGEL PI IND STRL 8 (GLOVE) ×1 IMPLANT
GLOVE BIOGEL PI INDICATOR 7.5 (GLOVE) ×3
GLOVE BIOGEL PI INDICATOR 8 (GLOVE) ×1
GLOVE BIOGEL PI ORTHO PRO 7.5 (GLOVE) ×1
GLOVE PI ORTHO PRO STRL 7.5 (GLOVE) IMPLANT
GLOVE SURG SS PI 7.5 STRL IVOR (GLOVE) ×2 IMPLANT
GOWN PREVENTION PLUS XLARGE (GOWN DISPOSABLE) ×2 IMPLANT
GOWN SRG XL XLNG 56XLVL 4 (GOWN DISPOSABLE) IMPLANT
GOWN STRL NON-REIN LRG LVL3 (GOWN DISPOSABLE) ×3 IMPLANT
GOWN STRL NON-REIN XL XLG LVL4 (GOWN DISPOSABLE) ×4
GRAFT BNE CHIP CANC 1-8 40 (Bone Implant) IMPLANT
IMMOBILIZER KNEE 22 UNIV (SOFTGOODS) ×1 IMPLANT
KIT BASIN OR (CUSTOM PROCEDURE TRAY) ×2 IMPLANT
KIT INFUSE LRG II (Orthopedic Implant) ×1 IMPLANT
KIT ROOM TURNOVER OR (KITS) ×2 IMPLANT
MANIFOLD NEPTUNE II (INSTRUMENTS) ×1 IMPLANT
NDL SUT .5 MAYO 1.404X.05X (NEEDLE) IMPLANT
NEEDLE 22X1 1/2 (OR ONLY) (NEEDLE) IMPLANT
NEEDLE MAYO TAPER (NEEDLE)
NS IRRIG 1000ML POUR BTL (IV SOLUTION) ×2 IMPLANT
PACK ORTHO EXTREMITY (CUSTOM PROCEDURE TRAY) ×2 IMPLANT
PAD ARMBOARD 7.5X6 YLW CONV (MISCELLANEOUS) ×4 IMPLANT
PAD CAST 4YDX4 CTTN HI CHSV (CAST SUPPLIES) ×1 IMPLANT
PADDING CAST COTTON 4X4 STRL (CAST SUPPLIES) ×2
PADDING CAST COTTON 6X4 STRL (CAST SUPPLIES) ×4 IMPLANT
PLATE LOCK 15H RT MED DIST TIB (Plate) ×1 IMPLANT
SCREW CORT FT 32X3.5XNONLOCK (Screw) IMPLANT
SCREW CORTICAL 3.5MM  32MM (Screw) ×1 IMPLANT
SCREW CORTICAL 3.5MM  34MM (Screw) ×1 IMPLANT
SCREW CORTICAL 3.5MM  44MM (Screw) ×1 IMPLANT
SCREW CORTICAL 3.5MM  55MM (Screw) ×1 IMPLANT
SCREW CORTICAL 3.5MM 32MM (Screw) ×1 IMPLANT
SCREW CORTICAL 3.5MM 34MM (Screw) IMPLANT
SCREW CORTICAL 3.5MM 44MM (Screw) IMPLANT
SCREW CORTICAL 3.5MM 55MM (Screw) IMPLANT
SCREW LOCK CORT STAR 3.5X28 (Screw) ×3 IMPLANT
SCREW LOCK CORT STAR 3.5X40 (Screw) ×1 IMPLANT
SCREW LOCK CORT STAR 3.5X50 (Screw) ×1 IMPLANT
SCREW LOCK CORT STAR 3.5X52 (Screw) ×2 IMPLANT
SCREW LOCK CORT STAR 3.5X54 (Screw) ×2 IMPLANT
SCREW LOCK CORT STAR 3.5X56 (Screw) ×1 IMPLANT
SPONGE GAUZE 4X4 12PLY (GAUZE/BANDAGES/DRESSINGS) ×3 IMPLANT
SPONGE LAP 18X18 X RAY DECT (DISPOSABLE) ×4 IMPLANT
SPONGE SCRUB IODOPHOR (GAUZE/BANDAGES/DRESSINGS) ×2 IMPLANT
STAPLER VISISTAT 35W (STAPLE) ×2 IMPLANT
STOCKINETTE IMPERVIOUS LG (DRAPES) ×1 IMPLANT
STRIP CLOSURE SKIN 1/2X4 (GAUZE/BANDAGES/DRESSINGS) IMPLANT
SUCTION FRAZIER TIP 10 FR DISP (SUCTIONS) ×1 IMPLANT
SUT ETHILON 3 0 PS 1 (SUTURE) ×3 IMPLANT
SUT PDS AB 2-0 CT1 27 (SUTURE) IMPLANT
SUT PROLENE 0 CT 2 (SUTURE) ×2 IMPLANT
SUT VIC AB 0 CT1 27 (SUTURE)
SUT VIC AB 0 CT1 27XBRD ANBCTR (SUTURE) ×1 IMPLANT
SUT VIC AB 1 CT1 27 (SUTURE)
SUT VIC AB 1 CT1 27XBRD ANBCTR (SUTURE) ×1 IMPLANT
SUT VIC AB 2-0 CT1 27 (SUTURE) ×4
SUT VIC AB 2-0 CT1 TAPERPNT 27 (SUTURE) ×2 IMPLANT
SWAB COLLECTION DEVICE MRSA (MISCELLANEOUS) ×1 IMPLANT
SYR 20ML ECCENTRIC (SYRINGE) IMPLANT
SYR CONTROL 10ML LL (SYRINGE) IMPLANT
TOWEL OR 17X24 6PK STRL BLUE (TOWEL DISPOSABLE) ×4 IMPLANT
TOWEL OR 17X26 10 PK STRL BLUE (TOWEL DISPOSABLE) ×4 IMPLANT
TRAY FOLEY CATH 16FRSI W/METER (SET/KITS/TRAYS/PACK) ×1 IMPLANT
TUBE ANAEROBIC SPECIMEN COL (MISCELLANEOUS) ×1 IMPLANT
TUBE CONNECTING 12X1/4 (SUCTIONS) ×1 IMPLANT
UNDERPAD 30X30 INCONTINENT (UNDERPADS AND DIAPERS) ×2 IMPLANT
WATER STERILE IRR 1000ML POUR (IV SOLUTION) ×2 IMPLANT
YANKAUER SUCT BULB TIP NO VENT (SUCTIONS) ×1 IMPLANT

## 2013-07-03 NOTE — H&P (Signed)
I have seen and examined the patient. I agree with the findings above.  I discussed with the patient the risks and benefits of surgery, including the possibility of infection, persistent nonunion, nerve injury, vessel injury, wound breakdown, arthritis, symptomatic hardware, DVT/ PE, loss of motion, and need for further surgery among others.  He understood these risks and wished to proceed.  Budd Palmer, MD 07/03/2013 8:16 AM

## 2013-07-03 NOTE — Anesthesia Postprocedure Evaluation (Signed)
Anesthesia Post Note  Patient: [name]  Procedure(s) Performed: Procedure(s) (LRB): RIGHT TIBIA NON UNION REPAIR  (Right) HARDWARE REMOVAL RIGHT TIBIA  (Right)  Anesthesia type: General  Patient location: PACU  Post pain: Pain level controlled and Adequate analgesia  Post assessment: Post-op Vital signs reviewed, Patient's Cardiovascular Status Stable, Respiratory Function Stable, Patent Airway and Pain level controlled  Last Vitals:  Filed Vitals:   07/03/13 1215  BP:   Pulse:   Temp: 36.4 C  Resp:     Post vital signs: Reviewed and stable  Level of consciousness: awake, alert  and oriented  Complications: No apparent anesthesia complications

## 2013-07-03 NOTE — Op Note (Signed)
[name], [name]              ACCOUNT NO.:  [id number]  MEDICAL RECORD NO.:  [id number]  LOCATION:  [facility]                         FACILITY:  [facility]  PHYSICIAN:  Doralee Albino. Carola Frost, M.D. DATE OF BIRTH:  10-01-1946  DATE OF PROCEDURE:  07/03/2013 DATE OF DISCHARGE:                              OPERATIVE REPORT   PREOPERATIVE DIAGNOSIS: 1. Right tibia, nonunion with broken hardware. 2. Loose migrated hardware, right fibula.  POSTOPERATIVE DIAGNOSIS: 1. Right tibia, nonunion with broken hardware. 2. Loose migrated hardware, right fibula.  PROCEDURE: 1. Repair of right tibia nonunion with infuse allografting. 2. Removal of broken, right tibia hardware. 3. Removal of right fibula hardware.  SURGEON:  Doralee Albino. Carola Frost, M.D.  ASSISTANT:  Mearl Latin, [place]  ANESTHESIA:  General.  COMPLICATIONS:  None.  TOTAL TOURNIQUET TIME:  2 hours.  SPECIMENS:  Two anaerobic, aerobic cultures sent to micro.  FINDINGS:  No organisms/1750 mL of crystalloid.  URINARY OUTPUT:  150 mL.  ESTIMATED BLOOD LOSS:  50 mL.  DISPOSITION:  To PACU.  CONDITION:  Stable.  BRIEF INDICATION FOR PROCEDURE:  [name] is a very pleasant 67- year-old male, who has had multiple fractures and difficulty with healing.  He underwent a bridge plating repair of his right tibia with IM rodding of the fibula approximately 6 months ago.  This has gone on to a nonunion with hardware breakage and valgus angulation.  I also discussed his case with his spine surgeon, Dr. Venita Lick who is following him for vertebra plana as well as his total joint surgeon, Dr. Durene Romans who is planning for revision of his long stem hip replacement because of loosening of the femur on the right.  His overall plan included primary repair of his tibia followed by a revision hip arthroplasty in 3 weeks.  I discussed with the patient and his partner, the risks and benefits of surgical repair for reconstruction of the tibia  nonunion including the potential for persistent nonunion, hardware failure, infection, nerve injury, vessel injury, DVT, PE, heart attack, stroke, loss of motion, other complications.  The patient and his friend understood this very clearly and did wish to proceed with attempted repair.  Furthermore, they did understand the potential risk or reactions to the grafting material and this was weighed against autograft, which in [name]'s case would carry significant risk.  It should be noted that currently laboratory tests for multiple myeloma are pending.  He has been previously referred to Endocrinology and is currently being treated with testosterone supplementation and further endocrine workup is in process.  BRIEF SUMMARY OF PROCEDURE:  [name] received 3 g of Ancef preoperatively.  He was taken to the operating room where general anesthesia was induced.  His right lower extremity was prepped and draped in usual sterile fashion.  Tourniquet was placed about the thigh. The old distal incision was remade and extended proximally connect with the percutaneous screw holes placed for the proximal screws.  This new incision was required to fully mobilize the plate.  There actually bone overgrowth and this needed to be removed and debrided such that the plate could be withdrawn.  We did not identify any evidence of  purulence or any other concern there.  Because of oozing at the exposure at the nonunion site, I did elevate the tourniquet.  I could see clearly even with the tourniquet inflated the avascular fibrous tissue within the nonunion site versus vascularized tissue and healthy bone.  With a 15 blade rongeur and curette, a 270 degree clean out of the tibia fracture was performed.  This revealed some dead pieces of cortical bone that perhaps a contributed to the nonunion anteriorly.  A cultures were obtained from this nonunion bed distally, I made an incision over the fibula when I  was unable to completely restore appropriate alignment of the tibia and removed the fibular rod.  This did help facilitate reduction somewhat, but it did persist in the deformity, which he has been for the last 4 months to a lesser degree with slight apex posterior and in slight valgus.  The wound was irrigated thoroughly and then the large infuse sponge with cancellous allograft was placed into the defect placing a 1/4th of the infuse along the posterior cortex and pushing and such that interdigitated in the fracture site and then 2 infuse burritos with allograft and site were placed into the large areas of defect, this area was then packed further with more cancellous bone, and the remaining 4th of the sponge was draped over top of the nonunion site bridging both sides of the fracture.  The plate was applied affixed distally.  First, it was checked for position and on lateral as well, and then again held off the bone proximally to try to reduce some of the varus.  Once we had distal fixation and apposition of the plate to the bone, we then placed proximal screws correcting some of the valgus, but not all of it.  I was cognizant of not producing a varus deformity, which would further increase by mechanical disadvantage toward healing. The tourniquet was deflated.  There was no additional hemostasis required.  The wound was reapproximated with 2-0 Vicryl, 3-0 nylon suture.  Sterile gently compressive dressing was applied and then a removable boot.  The patient was taken to PACU in stable condition. [name] did assist me throughout the procedure as did an additional assistant at times given the difficulty of exposure in this chronic problem the need to protect the posterior and anterior neurovascular bundles at all times.  My PA did also assist with wound closure.  PROGNOSIS:  [name] remains at high risk for persistent nonunion and would be expected from his long history of  difficulty with healing fractures.  We continued to be visualized on the endocrine for any correctable abnormalities and in particular awaiting the laboratory tests.  These are increased risk for infection as well.     Doralee Albino. Carola Frost, M.D.     [facility]HH/MEDQ  D:  07/03/2013  T:  07/03/2013  Job:  [id number]  cc:   Madlyn Frankel Charlann Boxer, M.D. Alvy Beal, MD

## 2013-07-03 NOTE — Progress Notes (Signed)
UR COMPLETED  

## 2013-07-03 NOTE — Progress Notes (Signed)
Orthopedic Tech Progress Note Patient Details:  [name] [birth year] [id number] Prafo boot delivered to OR front desk Ortho Devices Type of Ortho Device: Other (comment) Ortho Device/Splint Location: Prafo Ortho Device/Splint Interventions: Ordered   [place] 07/03/2013, 11:32 AM

## 2013-07-03 NOTE — Preoperative (Signed)
Beta Blockers   Reason not to administer Beta Blockers:Not Applicable 

## 2013-07-03 NOTE — Progress Notes (Signed)
Orthopedic Tech Progress Note Patient Details:  [name] 1945-10-19 [id number] OHF applied to bed Patient ID: [name], male   DOB: December 16, 1945, 67 y.o.   MRN: [id number]   [name] 07/03/2013, 4:04 PM

## 2013-07-03 NOTE — Progress Notes (Signed)
Assessment done by Sofie Rower RN

## 2013-07-03 NOTE — Transfer of Care (Signed)
Immediate Anesthesia Transfer of Care Note  Patient: [name]  Procedure(s) Performed: Procedure(s): RIGHT TIBIA NON UNION REPAIR  (Right) HARDWARE REMOVAL RIGHT TIBIA  (Right)  Patient Location: PACU  Anesthesia Type:General  Level of Consciousness: awake, alert  and oriented  Airway & Oxygen Therapy: Patient Spontanous Breathing and Patient connected to face mask oxygen  Post-op Assessment: Report given to PACU RN  Post vital signs: Reviewed and stable  Complications: No apparent anesthesia complications

## 2013-07-03 NOTE — Anesthesia Preprocedure Evaluation (Signed)
Anesthesia Evaluation  Patient identified by MRN, date of birth, ID band Patient awake    Reviewed: Allergy & Precautions, H&P , NPO status , Patient's Chart, lab work & pertinent test results  Airway Mallampati: II  Neck ROM: full    Dental   Pulmonary sleep apnea , former smoker,          Cardiovascular hypertension, + CAD + dysrhythmias Atrial Fibrillation     Neuro/Psych Anxiety Depression    GI/Hepatic GERD-  ,  Endo/Other  Hypothyroidism obese  Renal/GU      Musculoskeletal   Abdominal   Peds  Hematology  (+) Blood dyscrasia, anemia ,   Anesthesia Other Findings   Reproductive/Obstetrics                           Anesthesia Physical Anesthesia Plan  ASA: III  Anesthesia Plan: General   Post-op Pain Management:    Induction: Intravenous  Airway Management Planned: Oral ETT  Additional Equipment:   Intra-op Plan:   Post-operative Plan: Extubation in OR  Informed Consent: I have reviewed the patients History and Physical, chart, labs and discussed the procedure including the risks, benefits and alternatives for the proposed anesthesia with the patient or authorized representative who has indicated his/her understanding and acceptance.     Plan Discussed with: CRNA, Anesthesiologist and Surgeon  Anesthesia Plan Comments:         Anesthesia Quick Evaluation

## 2013-07-03 NOTE — Brief Op Note (Signed)
07/03/2013  11:51 AM  PATIENT:  [name]  67 y.o. male  PRE-OPERATIVE DIAGNOSIS:  RIGHT TIBIA NON UNION, loose hardware fibula  POST-OPERATIVE DIAGNOSIS:  RIGHT TIBIA NON UNION, loose hardware fibula  PROCEDURE:  Procedure(s): 1. RIGHT TIBIA NON UNION REPAIR  (Right) with Infuse allografting 2. HARDWARE REMOVAL RIGHT TIBIA  (Right) 3. Removal right fibula hardware  SURGEON:  Surgeon(s) and Role:    * Budd Palmer, MD - Primary  PHYSICIAN ASSISTANT: Montez Morita  ANESTHESIA:   general  EBL:  Total I/O In: 1750 [I.V.:1500; IV Piggyback:250] Out: 200 [Urine:150; Blood:50]  BLOOD ADMINISTERED:none  DRAINS: none   LOCAL MEDICATIONS USED:  NONE  SPECIMEN:  Source of Specimen:  tibia nonunion site  DISPOSITION OF SPECIMEN:  micro  COUNTS:  YES  TOURNIQUET:   Total Tourniquet Time Documented: Thigh (Right) - 120 minutes Total: Thigh (Right) - 120 minutes   DICTATION: .Other Dictation: Dictation Number [id number]  PLAN OF CARE: Admit to inpatient   PATIENT DISPOSITION:  PACU - hemodynamically stable.   Delay start of Pharmacological VTE agent (>24hrs) due to surgical blood loss or risk of bleeding: no

## 2013-07-04 LAB — CBC
MCH: 24 pg — ABNORMAL LOW (ref 26.0–34.0)
Platelets: 245 10*3/uL (ref 150–400)
RBC: 4.54 MIL/uL (ref 4.22–5.81)
WBC: 5.5 10*3/uL (ref 4.0–10.5)

## 2013-07-04 LAB — KAPPA/LAMBDA LIGHT CHAINS
Kappa, lambda light chain ratio: 0.97 (ref 0.26–1.65)
Lambda free light chains: 2.74 mg/dL — ABNORMAL HIGH (ref 0.57–2.63)

## 2013-07-04 LAB — BASIC METABOLIC PANEL
Calcium: 8.4 mg/dL (ref 8.4–10.5)
Chloride: 100 mEq/L (ref 96–112)
GFR calc non Af Amer: 85 mL/min — ABNORMAL LOW (ref >90)
Sodium: 133 mEq/L — ABNORMAL LOW (ref 135–145)

## 2013-07-04 MED ORDER — DOXYCYCLINE HYCLATE 100 MG PO CAPS: 100.0000 mg | ORAL_CAPSULE | Freq: Every day | ORAL | Status: DC

## 2013-07-04 MED ORDER — DOXYCYCLINE HYCLATE 100 MG PO TABS
100.0000 mg | ORAL_TABLET | Freq: Every day | ORAL | Status: DC
  Administered 2013-07-04 – 2013-07-06 (×3): 100 mg via ORAL
  Filled 2013-07-04 (×3): qty 1

## 2013-07-04 MED ORDER — INFLUENZA VAC SPLIT QUAD 0.5 ML IM SUSP
0.5000 mL | INTRAMUSCULAR | Status: AC
  Administered 2013-07-05: 0.5 mL via INTRAMUSCULAR
  Filled 2013-07-04: qty 0.5

## 2013-07-04 NOTE — Progress Notes (Signed)
Utilization review completed.  

## 2013-07-04 NOTE — Progress Notes (Signed)
[facility] Progress Note  Subjective  Doing well In better spirits Pain controlled Tolerating diet  Wants SNF   Review of Systems  Constitutional: Negative for fever and chills.  Respiratory: Negative for cough, shortness of breath and wheezing.   Cardiovascular: Negative for chest pain and palpitations.  Gastrointestinal: Negative for nausea, vomiting and abdominal pain.  Genitourinary:       Foley   Neurological: Negative for tingling, sensory change and headaches.    Objective  BP 128/52  Pulse 76  Temp(Src) 97.8 F (36.6 C) (Oral)  Resp 18  Ht 6\' 4"  (1.93 m)  Wt 135.501 kg (298 lb 11.6 oz)  BMI 36.38 kg/m2  SpO2 96%  Intake/Output     09/23 0701 - 09/24 0700 09/24 0701 - 09/25 0700   P.O. 960    I.V. (mL/kg) 3600 (26.6)    IV Piggyback 300    Total Intake(mL/kg) 4860 (35.9)    Urine (mL/kg/hr) 1300 (0.4)    Blood 100 (0)    Total Output 1400     Net +3460            Labs  Results for MACRAE, WIEGMAN (MRN [id number]) as of 07/04/2013 08:45  Ref. Range 07/03/2013 17:08  WBC Latest Range: 4.0-10.5 K/uL 9.1  RBC Latest Range: 4.22-5.81 MIL/uL 4.70  Hemoglobin Latest Range: 13.0-17.0 g/dL 29.5 (L)  HCT Latest Range: 39.0-52.0 % 35.8 (L)  MCV Latest Range: 78.0-100.0 fL 76.2 (L)  MCH Latest Range: 26.0-34.0 pg 24.5 (L)  MCHC Latest Range: 30.0-36.0 g/dL 28.4  RDW Latest Range: 11.5-15.5 % 17.9 (H)  Platelets Latest Range: 150-400 K/uL 256    Results for RIMAS, GILHAM (MRN [id number]) as of 07/04/2013 08:45  Ref. Range 07/03/2013 07:20  Prolactin Latest Range: 2.1-17.1 ng/mL 9.1  Glucose Latest Range: 70-99 mg/dL 98  TSH Latest Range: 7.253-6.644 uIU/mL 3.363    Exam  Gen: awake and alert, NAD, appears comfortable Lungs: CTA B  Cardiac: S1 and S2 Abd: + BS, NT Ext  Right Lower Extremity    Dressing C/D/I   Distal motor and sensory functions intact   Ext warm   + DP pulse   Compartments soft and NT   No  DCT     Assessment and Plan  67 y/o male s/p Repair of R tibial shaft nonunion POD 1  1. R tibial shaft Nonunion s/p revision ORIF, POD1  NWB X 8 weeks  Ankle motion as tolerated  Ice and elevate  Dressing change Friday  PT/OT consults  2. R THA femoral component loosening  Surgery for this delayed due to # 1   Dr. Charlann Boxer to address surgically in 3-4 weeks  Total hip precautions  3. Lumbar compression fx  Up with brace on   Dr. Shon Baton following    4. Chronic nonunion/malunion R distal radius  Dr. Amanda Pea following  5. Afib  Home meds including amiodarone and Xarelto   6. Medical issues  Continue with home meds  7. Metabolic bone disease  Pt with persistently low testosterone levels despite therapy   Prolactin levels normal, TSH levels normal thus not likely related to pituitary dysfunction   Labs pending for monoclonal gammopathy   8. DVT/PE prophylaxis  Xarelto   9. Sleep apnea  CPAP  10. Pain   Continue with current regimen  11. UTI  Pt with UTI on admission  Continue with Ceftriaxone until C&S return  12. ID  Pt on chronic doxycycline use for dental issues  Will restart  13. FEN  D/c foley   Advance diet as tolerated  14. Dispo  PT/OT consults  Follow up labs  Likely ready for SNF Friday   Mearl Latin, PA-C [facility] [phone] (P) 07/04/2013 8:54 AM

## 2013-07-04 NOTE — Evaluation (Signed)
Occupational Therapy Evaluation Patient Details Name: [name] MRN: [id number] DOB: 1946/01/31 Today's Date: 07/04/2013 Time: 4098-1191 OT Time Calculation (min): 47 min  OT Assessment / Plan / Recommendation History of present illness 67 y/o male with multiple medical problems and multiple chronic ortho issues presents this hospital admission for repair of R tibia nonunion.  Pt also with hypogonadism/testosterone deficiency as well and is also on vitamin D deficiency which are contributing to prolonged healing course and multiple fxs    Clinical Impression   Patient is s/p ORIF R tib/fib nonunion surgery resulting in functional limitations due to the deficits listed below (see OT Problem List). In addition, pt requires R wrist splint when using RW, and back brace with OOB activity secondary to compression fx; Pt will also be coming back for a revision of loosening RTHA in a few weeks;  Patient will benefit from skilled OT to increase their BADL independence and safety with functional mobility to allow discharge to the venue listed below.       OT Assessment  Patient needs continued OT Services    Follow Up Recommendations  SNF    Barriers to Discharge Decreased caregiver support    Equipment Recommendations  None recommended by OT    Frequency  Min 2X/week    Precautions / Restrictions Precautions Precautions: Back Required Braces or Orthoses: Spinal Brace Spinal Brace: Applied in sitting position Restrictions RLE Weight Bearing: Non weight bearing   Pertinent Vitals/Pain Pre-medicated, 2/10 at rest, 4/10 with activity, rest, and repositioned    ADL  Where Assessed - Upper Body Bathing: Unsupported sitting Lower Body Bathing: Simulated;Moderate assistance Where Assessed - Lower Body Bathing: Supported standing;Unsupported sitting Lower Body Dressing: Performed;Simulated;Moderate assistance Where Assessed - Lower Body Dressing: Unsupported sitting;Supported  standing Toilet Transfer: Performed;Moderate assistance Toilet Transfer Method: Sit to stand;Stand pivot Toilet Transfer Equipment: Bedside commode (HD drop arm) Toileting - Clothing Manipulation and Hygiene: Performed;Moderate assistance Where Assessed - Toileting Clothing Manipulation and Hygiene: Standing;Sit to stand from 3-in-1 or toilet Transfers/Ambulation Related to ADLs: relies heavily on walker, vcs for technique to limit jolting hop ADL Comments: patient has reacher at home    OT Diagnosis: Generalized weakness;Acute pain  OT Problem List: Decreased strength;Decreased activity tolerance;Impaired balance (sitting and/or standing);Decreased knowledge of use of DME or AE;Obesity;Pain OT Treatment Interventions: Self-care/ADL training;Energy conservation;DME and/or AE instruction;Therapeutic activities;Patient/family education;Balance training   OT Goals(Current goals can be found in the care plan section) Acute Rehab OT Goals Patient Stated Goal: heal this, and get hip and back worked on  Visit Information  Last OT Received On: 07/04/13 Assistance Needed: +1 History of Present Illness: 67 y/o male with multiple medical problems and multiple chronic ortho issues presents this hospital admission for repair of R tibia nonunion.  Pt also with hypogonadism/testosterone deficiency as well and is also on vitamin D deficiency which are contributing to prolonged healing course and multiple fxs        Prior Functioning     Home Living Family/patient expects to be discharged to:: Skilled nursing facility Prior Function Level of Independence: Needs assistance ADL's / Homemaking Assistance Needed: occasional min assist Communication Communication: No difficulties Dominant Hand: Left   Cognition  Cognition Arousal/Alertness: Awake/alert Behavior During Therapy: WFL for tasks assessed/performed Overall Cognitive Status: Within Functional Limits for tasks assessed    Extremity/Trunk  Assessment Upper Extremity Assessment Upper Extremity Assessment: LUE deficits/detail LUE Deficits / Details: Chronic nonunion/malunion R distal radius and followed by Dr. Amanda Pea.  Recent approval for  WBAT using a wrist brace for weight bearing and when using walker Lower Extremity Assessment Lower Extremity Assessment: Defer to PT evaluation RLE Deficits / Details: Ankle immobilized in bulky dressing and PRAFO; able to lift LE against gravity LLE Deficits / Details: Generally weak, with heavy dependence on RW     Mobility Bed Mobility Bed Mobility: Supine to Sit;Sitting - Scoot to Edge of Bed Supine to Sit: 5: Supervision;With rails Sitting - Scoot to Edge of Bed: 5: Supervision;With rail Details for Bed Mobility Assistance: Somewhat inefficient and energy taxing, but did not require physical assist Transfers Sit to Stand: 4: Min assist;From bed;With upper extremity assist Stand to Sit: 4: Min guard;To chair/3-in-1;With upper extremity assist Details for Transfer Assistance: Some assist to steady RW while pt tended to pull up on RW; Cues for safety with transitions     End of Session OT - End of Session Equipment Utilized During Treatment: Rolling walker;Back brace (PRAFO) Activity Tolerance: Patient tolerated treatment well Patient left: in chair;with call bell/phone within reach  GO Functional Assessment Tool Used: clinical judgement Functional Limitation: Self care Self Care Current Status (W0981): At least 20 percent but less than 40 percent impaired, limited or restricted Self Care Goal Status (X9147): 0 percent impaired, limited or restricted   Jessica Seidman 07/04/2013, 2:11 PM

## 2013-07-04 NOTE — Evaluation (Signed)
Physical Therapy Evaluation Patient Details Name: [name] MRN: [id number] DOB: October 15, 1945 Today's Date: 07/04/2013 Time: 4098-1191 PT Time Calculation (min): 47 min  PT Assessment / Plan / Recommendation History of Present Illness  67 y/o male with multiple medical problems and multiple chronic ortho issues presents this hospital admission for repair of R tibia nonunion.  Pt also with hypogonadism/testosterone deficiency as well and is also on vitamin D deficiency which are contributing to prolonged healing course and multiple fxs   Clinical Impression  Patient is s/p ORIF R tib/fib nonunion surgery resulting in functional limitations due to the deficits listed below (see PT Problem List). In addition, pt requires R wrist splint when using RW, and back brace with OOB activity secondary to compression fx; Pt will also be coming back for a revision of loosening RTHA in a few weeks;  Patient will benefit from skilled PT to increase their independence and safety with mobility to allow discharge to the venue listed below.       PT Assessment  Patient needs continued PT services    Follow Up Recommendations  SNF    Does the patient have the potential to tolerate intense rehabilitation      Barriers to Discharge        Equipment Recommendations  None recommended by PT (he's pretty well-equipped)    Recommendations for Other Services     Frequency Min 5X/week    Precautions / Restrictions Precautions Precautions: Back Required Braces or Orthoses: Spinal Brace Spinal Brace: Applied in sitting position Restrictions RLE Weight Bearing: Non weight bearing   Pertinent Vitals/Pain No pain when motionless; R foot and ankle pain with moving, and with foot in dependent position (5-6/10?) elevated for edema and pain control       Mobility  Bed Mobility Bed Mobility: Supine to Sit;Sitting - Scoot to Edge of Bed Supine to Sit: 5: Supervision;With rails Sitting - Scoot to Edge of  Bed: 5: Supervision;With rail Details for Bed Mobility Assistance: Somewhat inefficient and energy taxing, but did not require physical assist Transfers Transfers: Sit to Stand;Stand to Sit Sit to Stand: 4: Min assist;From bed;With upper extremity assist Stand to Sit: 4: Min guard;To chair/3-in-1;With upper extremity assist Details for Transfer Assistance: Some assist to steady RW while pt tended to pull up on RW; Cues for safety with transitions Ambulation/Gait Ambulation/Gait Assistance: 4: Min assist Ambulation Distance (Feet): 3 Feet (x2) Assistive device: Rolling walker Ambulation/Gait Assistance Details: physical assist to keep NWBing RLE; noted at times R foot touching floor Gait Pattern: Step-to pattern    Exercises     PT Diagnosis: Difficulty walking;Acute pain;Generalized weakness  PT Problem List: Decreased strength;Decreased activity tolerance;Decreased balance;Decreased mobility;Decreased coordination;Decreased knowledge of use of DME;Pain PT Treatment Interventions: DME instruction;Gait training;Functional mobility training;Therapeutic activities;Therapeutic exercise;Patient/family education     PT Goals(Current goals can be found in the care plan section) Acute Rehab PT Goals Patient Stated Goal: heal this, and get hip and back worked on PT Goal Formulation: With patient Time For Goal Achievement: 07/11/13 Potential to Achieve Goals: Good  Visit Information  Last PT Received On: 07/04/13 Assistance Needed: +1 PT/OT Co-Evaluation/Treatment: Yes History of Present Illness: 67 y/o male with multiple medical problems and multiple chronic ortho issues presents this hospital admission for repair of R tibia nonunion.  Pt also with hypogonadism/testosterone deficiency as well and is also on vitamin D deficiency which are contributing to prolonged healing course and multiple fxs        Prior Functioning  Home Living Family/patient expects to be discharged to:: Skilled  nursing facility Prior Function Level of Independence: Needs assistance Communication Communication: No difficulties Dominant Hand: Left    Cognition  Cognition Arousal/Alertness: Awake/alert Behavior During Therapy: WFL for tasks assessed/performed Overall Cognitive Status: Within Functional Limits for tasks assessed    Extremity/Trunk Assessment Upper Extremity Assessment Upper Extremity Assessment: Defer to OT evaluation Lower Extremity Assessment Lower Extremity Assessment: RLE deficits/detail;LLE deficits/detail RLE Deficits / Details: Ankle immobilized in bulky dressing and PRAFO; able to lift LE against gravity LLE Deficits / Details: Generally weak, with heavy dependence on RW   Balance    End of Session PT - End of Session Equipment Utilized During Treatment: Back brace Activity Tolerance: Patient tolerated treatment well Patient left: in chair;with call bell/phone within reach  GP Functional Assessment Tool Used: Clinical Judgement Functional Limitation: Mobility: Walking and moving around Mobility: Walking and Moving Around Current Status (J4782): At least 20 percent but less than 40 percent impaired, limited or restricted Mobility: Walking and Moving Around Goal Status [phone]): 0 percent impaired, limited or restricted   Van Clines [facility] [place], [place] [phone]  07/04/2013, 12:45 PM

## 2013-07-05 ENCOUNTER — Encounter (HOSPITAL_COMMUNITY): Payer: Self-pay | Admitting: Orthopedic Surgery

## 2013-07-05 LAB — URINE CULTURE: Colony Count: >=100000

## 2013-07-05 LAB — UIFE/LIGHT CHAINS/TP QN, 24-HR UR
Free Kappa Lt Chains,Ur: 12.7 mg/dL — ABNORMAL HIGH (ref 0.14–2.42)
Free Kappa/Lambda Ratio: 15.3 ratio — ABNORMAL HIGH (ref 2.04–10.37)
Total Protein, Urine: 16.3 mg/dL

## 2013-07-05 LAB — BASIC METABOLIC PANEL
CO2: 26 mEq/L (ref 19–32)
CO2: 26 mEq/L (ref 19–32)
Calcium: 8.3 mg/dL — ABNORMAL LOW (ref 8.4–10.5)
Calcium: 8.5 mg/dL (ref 8.4–10.5)
Chloride: 97 mEq/L (ref 96–112)
Chloride: 97 mEq/L (ref 96–112)
Creatinine, Ser: 0.86 mg/dL (ref 0.50–1.35)
GFR calc Af Amer: >90 mL/min (ref >90)
GFR calc non Af Amer: 88 mL/min — ABNORMAL LOW (ref >90)
Glucose, Bld: 137 mg/dL — ABNORMAL HIGH (ref 70–99)
Potassium: 4.2 mEq/L (ref 3.5–5.1)
Sodium: 130 mEq/L — ABNORMAL LOW (ref 135–145)
Sodium: 131 mEq/L — ABNORMAL LOW (ref 135–145)

## 2013-07-05 LAB — PROTEIN ELECTROPH W RFLX QUANT IMMUNOGLOBULINS
Alpha-2-Globulin: 10.3 % (ref 7.1–11.8)
Beta 2: 4 % (ref 3.2–6.5)
Beta Globulin: 7.1 % (ref 4.7–7.2)
M-Spike, %: NOT DETECTED g/dL
Total Protein ELP: 6.7 g/dL (ref 6.0–8.3)

## 2013-07-05 NOTE — Progress Notes (Signed)
Orthopaedic Trauma Service Progress Note  Subjective  Doing well Pain controlled  No new issues Plan for [facility] living tomorrow   Review of Systems  Constitutional: Negative for fever and chills.  Respiratory: Negative for shortness of breath and wheezing.   Cardiovascular: Negative for chest pain.  Gastrointestinal: Negative for nausea, vomiting and abdominal pain.  Neurological: Negative for tingling, sensory change and headaches.     Objective    BP 128/52  Pulse 74  Temp(Src) 98.1 F (36.7 C) (Oral)  Resp 20  Ht 6\' 4"  (1.93 m)  Wt 135.501 kg (298 lb 11.6 oz)  BMI 36.38 kg/m2  SpO2 97%  Intake/Output     09/24 0701 - 09/25 0700 09/25 0701 - 09/26 0700   P.O. 515 240   I.V. (mL/kg) 720 (5.3)    IV Piggyback     Total Intake(mL/kg) 1235 (9.1) 240 (1.8)   Urine (mL/kg/hr) 1400 (0.4) 200 (0.2)   Blood     Total Output 1400 200   Net -165 +40          Labs Results for JUNIE, ENGRAM (MRN [id number]) as of 07/05/2013 15:09  Ref. Range 07/05/2013 12:45  Sodium Latest Range: 135-145 mEq/L 131 (L)  Potassium Latest Range: 3.5-5.1 mEq/L 4.2  Chloride Latest Range: 96-112 mEq/L 97  CO2 Latest Range: 19-32 mEq/L 26  BUN Latest Range: 6-23 mg/dL 14  Creatinine Latest Range: 0.50-1.35 mg/dL 1.47  Calcium Latest Range: 8.4-10.5 mg/dL 8.5  GFR calc non Af Amer Latest Range: >90 mL/min 88 (L)  GFR calc Af Amer Latest Range: >90 mL/min >90  Glucose Latest Range: 70-99 mg/dL 829 (H)   Exam  Gen: awake and alert, NAD, appears comfortable Lungs: CTA B   Cardiac: S1 and S2 Abd: + BS, NT Ext           Right Lower Extremity                         Dressing C/D/I                       Distal motor and sensory functions intact                       Ext warm                       + DP pulse                       Compartments soft and NT                       No DCT   No acute changes                          Assessment and Plan  67 y/o male s/p Repair of R tibial  shaft nonunion POD 2  1. R tibial shaft Nonunion s/p revision ORIF, POD2           NWB X 8 weeks           Ankle motion as tolerated           Ice and elevate           Dressing change Friday           PT/OT consults  2. R  THA femoral component loosening           Surgery for this delayed due to # 1             Dr. Charlann Boxer to address surgically in 3-4 weeks           Total hip precautions  3. Lumbar compression fx           Up with brace on             Dr. Shon Baton following              4. Chronic nonunion/malunion R distal radius           Dr. Amanda Pea following  5. Afib           Home meds including amiodarone and Xarelto            6. Medical issues           Continue with home meds  7. Metabolic bone disease           Pt with persistently low testosterone levels despite therapy             Prolactin levels normal, TSH levels normal thus not likely related to pituitary dysfunction             Labs pending for monoclonal gammopathy   Preliminary labs do not favor monoclonal gammopathy, awaiting finalized results   8. DVT/PE prophylaxis           Xarelto   9. Sleep apnea           CPAP  10. Pain             Continue with current regimen  11. UTI           Pt with UTI on admission           Continue with Ceftriaxone until C&S return  12. ID           Pt on chronic doxycycline use for dental issues           Will restart  13. FEN           Advance diet as tolerated  Hyponatremia- D/c IVF, follow up labs in am. Currently asymptomatic   14. Dispo           PT/OT           Follow up labs          SNF tomorrow   Mearl Latin, PA-C [facility] [phone] (P) 07/05/2013 2:42 PM

## 2013-07-05 NOTE — Progress Notes (Signed)
Physical Therapy Treatment Patient Details Name: [name] MRN: [id number] DOB: Apr 22, 1946 Today's Date: 07/05/2013 Time: 4098-1191 PT Time Calculation (min): 23 min  PT Assessment / Plan / Recommendation  History of Present Illness 67 y/o male with multiple medical problems and multiple chronic ortho issues presents this hospital admission for repair of R tibia nonunion.  Pt also with hypogonadism/testosterone deficiency as well and is also on vitamin D deficiency which are contributing to prolonged healing course and multiple fxs    PT Comments   Patient required some encouragement for OOB this morning. Patient stated he was left up in the chair awhile yesterday and it became very uncomfortable. Placed pillows in chair for patient to increased comfort and it appeared to work. Patient awaiting continued therapy at SNF when medically ready and bed available.   Follow Up Recommendations  SNF     Does the patient have the potential to tolerate intense rehabilitation     Barriers to Discharge        Equipment Recommendations  None recommended by PT    Recommendations for Other Services    Frequency Min 5X/week   Progress towards PT Goals Progress towards PT goals: Progressing toward goals  Plan Current plan remains appropriate    Precautions / Restrictions Precautions Precautions: Back Required Braces or Orthoses: Spinal Brace Spinal Brace: Applied in sitting position Restrictions RLE Weight Bearing: Non weight bearing   Pertinent Vitals/Pain no apparent distress    Mobility  Bed Mobility Supine to Sit: 5: Supervision;With rails Transfers Sit to Stand: 4: Min assist;From bed;With upper extremity assist Stand to Sit: 4: Min guard;To chair/3-in-1;With upper extremity assist Details for Transfer Assistance: A to steady patient. Cues for technique Ambulation/Gait Ambulation/Gait Assistance: 4: Min assist Ambulation Distance (Feet): 2 Feet Assistive device: Rolling  walker Ambulation/Gait Assistance Details: Patient able to maintain NWB this session. A to ensure balance and safe use of RW Gait Pattern: Step-to pattern    Exercises     PT Diagnosis:    PT Problem List:   PT Treatment Interventions:     PT Goals (current goals can now be found in the care plan section)    Visit Information  Last PT Received On: 07/05/13 Assistance Needed: +1 History of Present Illness: 67 y/o male with multiple medical problems and multiple chronic ortho issues presents this hospital admission for repair of R tibia nonunion.  Pt also with hypogonadism/testosterone deficiency as well and is also on vitamin D deficiency which are contributing to prolonged healing course and multiple fxs     Subjective Data      Cognition  Cognition Arousal/Alertness: Awake/alert Behavior During Therapy: WFL for tasks assessed/performed Overall Cognitive Status: Within Functional Limits for tasks assessed    Balance     End of Session PT - End of Session Activity Tolerance: Patient tolerated treatment well Patient left: in chair;with call bell/phone within reach   GP     Fredrich Birks 07/05/2013, 11:48 AM  07/05/2013 Fredrich Birks PTA [phone] pager [phone] office

## 2013-07-05 NOTE — Clinical Social Work Psychosocial (Signed)
Clinical Social Work Department  BRIEF PSYCHOSOCIAL ASSESSMENT  Patient: [name] Account Number: [id number]  Admit date: 07/03/13 Clinical Social Worker Sabino Niemann, MSW Date/Time: 07/04/2013 11:00 AM Referred by: Physician Date Referred:  Referred for   SNF Placement   Other Referral:  Interview type: Patient  Other interview type: PSYCHOSOCIAL DATA  Living Status: with husband Admitted from facility:  Level of care:  Primary support name: Ruggles,Michael   Primary support relationship to patient: Spouse Degree of support available:  Strong and vested  CURRENT CONCERNS  Current Concerns   Post-Acute Placement   Other Concerns:  SOCIAL WORK ASSESSMENT / PLAN  CSW met with pt re: PT recommendation for SNF.   Pt lives with his spouse  CSW explained placement process and answered questions.   Pt reports [facility] [place] as his preference    CSW completed FL2 and initiated SNF search.     Assessment/plan status: Information/Referral to [facility]  Other assessment/ plan:  Information/referral to community resources:  SNF     PATIENT'S/FAMILY'S RESPONSE TO PLAN OF CARE:  Pt  reports he is agreeable to [facility] in order to increase strength and independence with mobility prior to returning home  Pt verbalized understanding of placement process and appreciation for CSW assist.   Sabino Niemann, MSW, LCSWA [phone]

## 2013-07-06 LAB — LIPID PANEL
Cholesterol: 114 mg/dL (ref 0–200)
LDL Cholesterol: 40 mg/dL (ref 0–99)
Triglycerides: 48 mg/dL (ref <150)
VLDL: 10 mg/dL (ref 0–40)

## 2013-07-06 LAB — BASIC METABOLIC PANEL
BUN: 12 mg/dL (ref 6–23)
CO2: 27 mEq/L (ref 19–32)
Calcium: 8.5 mg/dL (ref 8.4–10.5)
GFR calc non Af Amer: 88 mL/min — ABNORMAL LOW (ref >90)
Glucose, Bld: 150 mg/dL — ABNORMAL HIGH (ref 70–99)
Potassium: 3.8 mEq/L (ref 3.5–5.1)

## 2013-07-06 LAB — TISSUE CULTURE: Gram Stain: NONE SEEN

## 2013-07-06 LAB — C3 COMPLEMENT: C3 Complement: 102 mg/dL (ref 90–180)

## 2013-07-06 LAB — HEPATITIS B SURFACE ANTIGEN: Hepatitis B Surface Ag: NEGATIVE

## 2013-07-06 LAB — HEPATITIS C ANTIBODY: HCV Ab: NEGATIVE

## 2013-07-06 MED ORDER — METHOCARBAMOL 500 MG PO TABS: 500.0000 mg | ORAL_TABLET | Freq: Four times a day (QID) | ORAL | Status: DC | PRN

## 2013-07-06 MED ORDER — CEFUROXIME AXETIL 500 MG PO TABS: 500.0000 mg | ORAL_TABLET | Freq: Two times a day (BID) | ORAL | Status: AC

## 2013-07-06 MED ORDER — OXYCODONE HCL 5 MG PO TABS: 5.0000 mg | ORAL_TABLET | ORAL | Status: DC | PRN

## 2013-07-06 MED ORDER — CEFUROXIME AXETIL 500 MG PO TABS
500.0000 mg | ORAL_TABLET | Freq: Two times a day (BID) | ORAL | Status: DC
  Filled 2013-07-06 (×3): qty 1

## 2013-07-06 MED ORDER — DSS 100 MG PO CAPS: 100.0000 mg | ORAL_CAPSULE | Freq: Two times a day (BID) | ORAL | Status: DC

## 2013-07-06 MED ORDER — CEFADROXIL 500 MG PO CAPS
500.0000 mg | ORAL_CAPSULE | Freq: Two times a day (BID) | ORAL | Status: DC
  Filled 2013-07-06 (×2): qty 1

## 2013-07-06 MED ORDER — OXYCODONE-ACETAMINOPHEN 5-325 MG PO TABS: 1.0000 | ORAL_TABLET | Freq: Four times a day (QID) | ORAL | Status: DC | PRN

## 2013-07-06 NOTE — Discharge Summary (Signed)
Orthopaedic Trauma Service (OTS)  Patient ID: [name] MRN: [id number] DOB/AGE: [birth year] 67 y.o.  Admit date: 07/03/2013 Discharge date: 07/06/2013  Admission Diagnoses: R tibia nonunion Afib Hypothyroidism Obesity Hypogonadism, low testosterone GERD CAD HTN Osteo R wrist Deconditioning L1 compression fx Osteoporosis UTI  Discharge Diagnoses:  Active Problems:   Atrial fibrillation   Hypothyroidism   History of recurrent UTIs   Low testosterone, possible hypogonadism   HTN (hypertension)   GERD (gastroesophageal reflux disease)   CAD (coronary artery disease)   Osteomyelitis of right wrist   Obesity, unspecified   Physical deconditioning   Compression fracture of L1 lumbar vertebra   Pathological fracture due to osteoporosis with delayed healing   Osteoporosis with fracture   Nonunion, fracture, Right tibia    UTI (urinary tract infection)   Procedures Performed: 07/03/2013- Dr. Carola Frost  1. Repair of right tibia nonunion with infuse allografting. 2. Removal of broken, right tibia hardware. 3. Removal of right fibula hardware.   Discharged Condition: good  Hospital Course:    Patient is a 67 year old white male who is well-known to the orthopedic trauma service. Patient has a very complex medical history and the etiology of his poor bone healing capacity is still unclear at this time. Although it does appear that there is some aspect of hypogonadism present contributing to his poor healing potential but there may be some other factors at play as well. Patient's situation started approximately 13 months ago after he sustained a right hip and right distal radius fracture. He was seen and treated in [place].  Both of these fractures went on to nonunions. He was referred to [facility] for followup. Patient says he injured as well as his right wrist. While he was recovering from these he sustained a fall at home resulting in a right distal tibia fracture.  Patient underwent ORIF of this fracture. Subsequently developed a nonunion of this fracture as well along with hardware fracture, which is the reason for this hospitalization.  Patient was admitted on the same day of surgery for repair of his right tibia nonunion. He tolerated the procedure well. After surgery he went to the recovery room for recovery from anesthesia and transferred up to the orthopedic floor for continued observation, pain control and to resume therapies. Patient's hospital stay was uncomplicated. His total stay was 3 postoperative days. On postop day 3 he was deemed stable for discharge to skilled nursing facility. He is to have a revision of his right total hip in the next 3-4 weeks as well.   Patient's major issue here is his lack of ability to heal his fractures as well as his profound bone demineralization. During [facility] first hospitalization here at [facility] we did pursue an extensive metabolic bone workup which was significant for testosterone deficiency and vitamin D deficiency. We did arrange an endocrinology followup however outpatient I did not care to much for that particular group and as such has been seeing his primary care physician for management of his testosterone deficiency. He has been on testosterone injections 200 mg every 2 weeks. After this recent tibial nonunion we did recheck his testosterone levels and they were still low. As such we did pursue other avenues in terms of evaluating his metabolic bone disease. We did attain prolactin level which was normal and as well as his TSH level, he is on levothyroxine for hypothyroidism. His vitamin D is also back within normal ranges. Intact PTH is also normal at this time  as well. We also obtained serum protein electrophoresis as well as urinary protein electrophoresis. These appear to be negative for monoclonal gammopathy however his urine protein electrophoresis does demonstrate all fractures of proteins present in his urine. He has  elevated Kappa and lambda free light chains and elevated ratio. At this time we need to consider autoimmune disease as well as potential renal parenchymal disease given the overall clinical picture. will discuss with additional colleagues to determine which referral would be most helpful at this juncture. At the time of discharge the patient does have several labs pending including ANA and anti-double-stranded DNA.    Of note on admission patient was found to have UTI, Proteus. He was on ceftriaxone while we awaited for definitive cultures and then was transitioned to Ceftin for 21 days at discharge. Patient was also restarted on his Xarelto on postoperative day #1 for his afib and dvt/pe prophylaxis    Patient discharged on postoperative day #3 stable condition. Tolerating diet and voiding well with physical therapy. No acute issues noted at this time.  Consults: None  Significant Diagnostic Studies: labs: see progress note below   Treatments: IV hydration, antibiotics: Ancef, ceftriaxone and Ceftin, analgesia: acetaminophen, Dilaudid and percocet, oxy IR, cardiac meds: metoprolol and amiodarone, anticoagulation: Xarelto, therapies: PT, OT, RN and SW and surgery: as above  Discharge Exam:   Orthopaedic Trauma Service Progress Note  Subjective  Doing well Ready to go to snf   No new issues Pain controlled   Motivated  Review of Systems  Constitutional: Negative for fever and chills.  Eyes: Negative for blurred vision.  Respiratory: Negative for shortness of breath and wheezing.   Cardiovascular: Negative for chest pain and palpitations.  Gastrointestinal: Negative for nausea, vomiting and abdominal pain.  Neurological: Negative for tingling, sensory change and headaches.     Objective   BP 136/58  Pulse 71  Temp(Src) 98.7 F (37.1 C) (Oral)  Resp 18  Ht 6\' 4"  (1.93 m)  Wt 135.501 kg (298 lb 11.6 oz)  BMI 36.38 kg/m2  SpO2 98%  Intake/Output     09/25 0701 - 09/26 0700  09/26 0701 - 09/27 0700    P.O. 240 240    I.V. (mL/kg)      Total Intake(mL/kg) 240 (1.8) 240 (1.8)    Urine (mL/kg/hr) 400 (0.1) 200 (0.8)    Total Output 400 200    Net -160 +40            Labs  Urine culture 3d ago Specimen Description   URINE, CATHETERIZED     Special Requests   NONE     Culture Setup Time   07/03/2013 09:46 Performed at [facility]   Colony Count   >=100,000 COLONIES/ML Performed at [facility]   Culture   PROTEUS MIRABILIS Performed at [facility]   Report Status   07/05/2013 FINAL     Organism ID, Bacteria   PROTEUS MIRABILIS     Resulting Agency [facility]  Results for NEPHI, SAVAGE (MRN [id number]) as of 07/06/2013 08:51   Ref. Range  07/03/2013 16:36   Immunofixation, Urine  No range found  (NOTE)   Time-UPE24  No range found  RANDOM   Volume, Urine-UPE24  No range found  RANDOM   Total Protein, Urine-UPE24  No range found  16.3   Total Protein, Urine-Ur/day  Latest Range: 10-140 mg/day  NOT CALC   ALBUMIN, U  Latest Range: DETECTED   DETECTED   Alpha  1, Urine  Latest Range: NONE DETECTED   DETECTED (A)   Alpha 2, Urine  Latest Range: NONE DETECTED   DETECTED (A)   Beta, Urine  Latest Range: NONE DETECTED   DETECTED (A)   Gamma Globulin, Urine  Latest Range: NONE DETECTED   DETECTED (A)   Free Kappa Lt Chains,Ur  Latest Range: 0.14-2.42 mg/dL  16.10 (H)   Free Lt Chn Excr Rate  No range found  NOT CALC   Free Lambda Lt Chains,Ur  Latest Range: 0.02-0.67 mg/dL  9.60 (H)   Free Lambda Excretion/Day  No range found  NOT CALC   Free Kappa/Lambda Ratio  Latest Range: 2.04-10.37 ratio  15.30 (H)      Results for KEVAL, NAM (MRN [id number]) as of 07/06/2013 08:51   Ref. Range  07/03/2013 07:20   Kappa free light chain  Latest Range: 0.33-1.94 mg/dL  1.47 (H)   Lamda free light chains  Latest Range: 0.57-2.63 mg/dL  8.29 (H)   Kappa, lamda light chain ratio  Latest Range: 0.26-1.65   0.97     Results for  MONTERRIUS, CARDOSA (MRN [id number]) as of 07/06/2013 09:46  Ref. Range 07/06/2013 07:50  Sodium Latest Range: 135-145 mEq/L 133 (L)  Potassium Latest Range: 3.5-5.1 mEq/L 3.8  Chloride Latest Range: 96-112 mEq/L 97  CO2 Latest Range: 19-32 mEq/L 27  BUN Latest Range: 6-23 mg/dL 12  Creatinine Latest Range: 0.50-1.35 mg/dL 7.84  Calcium Latest Range: 8.4-10.5 mg/dL 8.5  GFR calc non Af Amer Latest Range: >90 mL/min 88 (L)  GFR calc Af Amer Latest Range: >90 mL/min >90  Glucose Latest Range: 70-99 mg/dL 696 (H)    Exam  Gen: awake and alert, NAD, appears comfortable Lungs: CTA B   Cardiac: S1 and S2 Abd: + BS, NT Ext           Right Lower Extremity                         Incision looks excellent                       No signs of infection                       No drainage                         Distal motor and sensory functions intact                       Ext warm                       + DP pulse                       Compartments soft and NT                       No DCT                       No acute changes     Assessment and Plan   POD 3   67 y/o male s/p Repair of R tibial shaft nonunion POD 3  1. R tibial shaft Nonunion s/p revision ORIF, POD3  NWB X 8 weeks           Ankle motion as tolerated           Ice and elevate           Dressing changed           Ok to wash wound with soap and water, dry dressing             PT/OT consults  2. R THA femoral component loosening           Surgery for this delayed due to # 1             Dr. Charlann Boxer to address surgically in 3-4 weeks           Total hip precautions  3. Lumbar compression fx           Up with brace on             Dr. Shon Baton following              4. Chronic nonunion/malunion R distal radius           Dr. Amanda Pea following  5. Afib           Home meds including amiodarone and Xarelto            6. Medical issues           Continue with home meds  7. Metabolic bone disease           Pt with  persistently low testosterone levels despite therapy             Prolactin levels normal, TSH levels normal thus not likely related to pituitary dysfunction             no evidence of multiple myeloma on SPEP/UPEP but findings are abnormal. ? If renal parenchymal disease or autoimmune disease. Abnormalities could also be due to acute inflammatory process           Have ordered additional labs including ANA, Anti-DSDNA ab, will follow up           Will discuss with colleagues at  [facility] fracture liaison service             Other potential consults include Renal, Rheum, Heme/Onc and Endocrine. We will complete further workup as an outpt              Continue with vitamin D and calcium supplementation             Continue with testosterone supplementation             DO NOT GIVE BISPHOSPHONATES    8. DVT/PE prophylaxis           Xarelto   9. Sleep apnea           CPAP  10. Pain             Continue with current regimen  11. UTI           Cx + for proteus             Will convert to PO abx x 21 days, ceftin   12. ID           Pt on chronic doxycycline use for dental issues             13. FEN           diet as tolerated  14. Dispo           PT/OT           f/u with ortho in 10 days            SNF       Mearl Latin, PA-C [facility] [phone] (P) 07/06/2013 8:49 AM   Disposition: 03-Skilled Nursing Facility  Discharge Orders   Future Orders Complete By Expires   Call MD / Call 911  As directed    Comments:     If you experience chest pain or shortness of breath, CALL 911 and be transported to the hospital emergency room.  If you develope a fever above 101 F, pus (white drainage) or increased drainage or redness at the wound, or calf pain, call your surgeon's office.   Constipation Prevention  As directed    Comments:     Drink plenty of fluids.  Prune juice may be helpful.  You may use a stool softener, such as Colace (over the  counter) 100 mg twice a day.  Use MiraLax (over the counter) for constipation as needed.   Diet - low sodium heart healthy  As directed    Discharge instructions  As directed    Comments:     Orthopaedic Trauma Service Discharge Instructions   General Discharge Instructions  WEIGHT BEARING STATUS: Nonweightbearing Right Leg  RANGE OF MOTION/ACTIVITY: Range of motion as tolerated Right ankle and knee. R THA precautions.  Pt can use Left leg to help pivot transfer   Diet: as you were eating previously.  Can use over the counter stool softeners and bowel preparations, such as Miralax, to help with bowel movements.  Narcotics can be constipating.  Be sure to drink plenty of fluids  STOP SMOKING OR USING NICOTINE PRODUCTS!!!!  As discussed nicotine severely impairs your body's ability to heal surgical and traumatic wounds but also impairs bone healing.  Wounds and bone heal by forming microscopic blood vessels (angiogenesis) and nicotine is a vasoconstrictor (essentially, shrinks blood vessels).  Therefore, if vasoconstriction occurs to these microscopic blood vessels they essentially disappear and are unable to deliver necessary nutrients to the healing tissue.  This is one modifiable factor that you can do to dramatically increase your chances of healing your injury.    (This means no smoking, no nicotine gum, patches, etc)  DO NOT USE NONSTEROIDAL ANTI-INFLAMMATORY DRUGS (NSAID'S)  Using products such as Advil (ibuprofen), Aleve (naproxen), Motrin (ibuprofen) for additional pain control during fracture healing can delay and/or prevent the healing response.  If you would like to take over the counter (OTC) medication, Tylenol (acetaminophen) is ok.  However, some narcotic medications that are given for pain control contain acetaminophen as well. Therefore, you should not exceed more than 4000 mg of tylenol in a day if you do not have liver disease.  Also note that there are may OTC medicines, such  as cold medicines and allergy medicines that my contain tylenol as well.  If you have any questions about medications and/or interactions please ask your doctor/PA or your pharmacist.   PAIN MEDICATION USE AND EXPECTATIONS  You have likely been given narcotic medications to help control your pain.  After a traumatic event that results in an fracture (broken bone) with or without surgery, it is ok to use narcotic pain medications to help control one's pain.  We understand that everyone responds to pain differently and each individual patient will be evaluated on a regular basis for  the continued need for narcotic medications. Ideally, narcotic medication use should last no more than 6-8 weeks (coinciding with fracture healing).   As a patient it is your responsibility as well to monitor narcotic medication use and report the amount and frequency you use these medications when you come to your office visit.   We would also advise that if you are using narcotic medications, you should take a dose prior to therapy to maximize you participation.  IF YOU ARE ON NARCOTIC MEDICATIONS IT IS NOT PERMISSIBLE TO OPERATE A MOTOR VEHICLE (MOTORCYCLE/CAR/TRUCK/MOPED) OR HEAVY MACHINERY DO NOT MIX NARCOTICS WITH OTHER CNS (CENTRAL NERVOUS SYSTEM) DEPRESSANTS SUCH AS ALCOHOL       ICE AND ELEVATE INJURED/OPERATIVE EXTREMITY  Using ice and elevating the injured extremity above your heart can help with swelling and pain control.  Icing in a pulsatile fashion, such as 20 minutes on and 20 minutes off, can be followed.    Do not place ice directly on skin. Make sure there is a barrier between to skin and the ice pack.    Using frozen items such as frozen peas works well as the conform nicely to the are that needs to be iced.  USE AN ACE WRAP OR TED HOSE FOR SWELLING CONTROL  In addition to icing and elevation, Ace wraps or TED hose are used to help limit and resolve swelling.  It is recommended to use Ace wraps or TED  hose until you are informed to stop.    When using Ace Wraps start the wrapping distally (farthest away from the body) and wrap proximally (closer to the body)   Example: If you had surgery on your leg or thing and you do not have a splint on, start the ace wrap at the toes and work your way up to the thigh        If you had surgery on your upper extremity and do not have a splint on, start the ace wrap at your fingers and work your way up to the upper arm  IF YOU ARE IN A SPLINT OR CAST DO NOT REMOVE IT FOR ANY REASON   If your splint gets wet for any reason please contact the office immediately. You may shower in your splint or cast as long as you keep it dry.  This can be done by wrapping in a cast cover or garbage back (or similar)  Do Not stick any thing down your splint or cast such as pencils, money, or hangers to try and scratch yourself with.  If you feel itchy take benadryl as prescribed on the bottle for itching  IF YOU ARE IN A CAM BOOT (BLACK BOOT)  You may remove boot periodically. Perform daily dressing changes as noted below.  Wash the liner of the boot regularly and wear a sock when wearing the boot. It is recommended that you sleep in the boot until told otherwise  CALL THE OFFICE WITH ANY QUESTIONS OR CONCERTS: [phone]     Discharge Pin Site Instructions  Dress pins daily with Kerlix roll starting on POD 2. Wrap the Kerlix so that it tamps the skin down around the pin-skin interface to prevent/limit motion of the skin relative to the pin.  (Pin-skin motion is the primary cause of pain and infection related to external fixator pin sites).  Remove any crust or coagulum that may obstruct drainage with a saline moistened gauze or soap and water.  After POD 3, if there is no discernable  drainage on the pin site dressing, the interval for change can by increased to every other day.  You may shower with the fixator, cleaning all pin sites gently with soap and water.  If you  have a surgical wound this needs to be completely dry and without drainage before showering.  The extremity can be lifted by the fixator to facilitate wound care and transfers.  Notify the office/Doctor if you experience increasing drainage, redness, or pain from a pin site, or if you notice purulent (thick, snot-like) drainage.  Discharge Wound Care Instructions  Do NOT apply any ointments, solutions or lotions to pin sites or surgical wounds.  These prevent needed drainage and even though solutions like hydrogen peroxide kill bacteria, they also damage cells lining the pin sites that help fight infection.  Applying lotions or ointments can keep the wounds moist and can cause them to breakdown and open up as well. This can increase the risk for infection. When in doubt call the office.  Surgical incisions should be dressed daily.  If any drainage is noted, use one layer of adaptic, then gauze, Kerlix, and an ace wrap.  Once the incision is completely dry and without drainage, it may be left open to air out.  Showering may begin 36-48 hours later.  Cleaning gently with soap and water.  Traumatic wounds should be dressed daily as well.    One layer of adaptic, gauze, Kerlix, then ace wrap.  The adaptic can be discontinued once the draining has ceased    If you have a wet to dry dressing: wet the gauze with saline the squeeze as much saline out so the gauze is moist (not soaking wet), place moistened gauze over wound, then place a dry gauze over the moist one, followed by Kerlix wrap, then ace wrap.   Discharge wound care:  As directed    Comments:     Dressing changes every other day. Can clean with soap and water. New dry dressing. ACE wrap or TED hose to R leg   Increase activity slowly as tolerated  As directed    Non weight bearing  As directed    Comments:     Right Leg       Medication List    STOP taking these medications       zoledronic acid 5 MG/100ML Soln injection   Commonly known as:  RECLAST      TAKE these medications       amiodarone 200 MG tablet  Commonly known as:  PACERONE  Take 100 mg by mouth daily.     amoxicillin 500 MG capsule  Commonly known as:  AMOXIL  Take 2,000 mg by mouth See admin instructions. Prior to dental exams every 6 months     atorvastatin 80 MG tablet  Commonly known as:  LIPITOR  Take 80 mg by mouth every evening.     Calcium Carbonate 1500 MG Tabs  Take 1 tablet by mouth daily.     cefUROXime 500 MG tablet  Commonly known as:  CEFTIN  Take 1 tablet (500 mg total) by mouth 2 (two) times daily with a meal.     clonazePAM 2 MG tablet  Commonly known as:  KLONOPIN  Take 1 mg by mouth 3 (three) times daily.     diltiazem 240 MG 24 hr capsule  Commonly known as:  CARDIZEM CD  Take 240 mg by mouth every morning.     doxycycline 100 MG capsule  Commonly  known as:  VIBRAMYCIN  Take 100 mg by mouth daily.     DSS 100 MG Caps  Take 100 mg by mouth 2 (two) times daily.     DULoxetine 60 MG capsule  Commonly known as:  CYMBALTA  Take 60 mg by mouth 2 (two) times daily.     ENBREL SURECLICK 50 MG/ML injection  Generic drug:  etanercept  50 mg every 14 (fourteen) days.     levothyroxine 125 MCG tablet  Commonly known as:  SYNTHROID, LEVOTHROID  Take 2 tablets (250 mcg total) by mouth daily before breakfast.     loratadine 10 MG tablet  Commonly known as:  CLARITIN  Take 10 mg by mouth daily.     methocarbamol 500 MG tablet  Commonly known as:  ROBAXIN  Take 1-2 tablets (500-1,000 mg total) by mouth every 6 (six) hours as needed.     metoprolol tartrate 25 MG tablet  Commonly known as:  LOPRESSOR  - Take 25 mg by mouth daily as needed. Take for elevated heart rate.  - Hold for sbp < 110, hr <60     multivitamin with minerals Tabs tablet  Take 1 tablet by mouth daily.     nitroGLYCERIN 0.4 MG SL tablet  Commonly known as:  NITROSTAT  Place 0.4 mg under the tongue every 5 (five) minutes as  needed for chest pain. x3 doses as needed for chest pain     omeprazole 20 MG capsule  Commonly known as:  PRILOSEC  Take 20 mg by mouth daily.     oxyCODONE 5 MG immediate release tablet  Commonly known as:  Oxy IR/ROXICODONE  Take 1-2 tablets (5-10 mg total) by mouth every 3 (three) hours as needed.     oxyCODONE-acetaminophen 5-325 MG per tablet  Commonly known as:  PERCOCET/ROXICET  Take 1-2 tablets by mouth every 6 (six) hours as needed.     polyethylene glycol packet  Commonly known as:  MIRALAX / GLYCOLAX  Take 17 g by mouth daily as needed. Constipation.     PROBIOTIC PO  Take 1 tablet by mouth every morning.     ranolazine 500 MG 12 hr tablet  Commonly known as:  RANEXA  Take 500 mg by mouth 2 (two) times daily.     tamsulosin 0.4 MG Caps capsule  Commonly known as:  FLOMAX  Take 0.4 mg by mouth every evening.     testosterone cypionate 200 MG/ML injection  Commonly known as:  DEPOTESTOTERONE CYPIONATE  Inject 200 mg into the muscle every 14 (fourteen) days.     vitamin C 1000 MG tablet  Take 1,000 mg by mouth daily.     Vitamin D 2000 UNITS Caps  Take 1 capsule by mouth daily.     XARELTO 20 MG Tabs tablet  Generic drug:  Rivaroxaban  Take 20 mg by mouth daily with supper.           Follow-up Information   Follow up with HANDY,MICHAEL H, MD. Schedule an appointment as soon as possible for a visit in 10 days. (call for appointment )    Specialty:  Orthopedic Surgery   Contact information:   [address] [place] [address] [phone]        Signed:  Mearl Latin, PA-C [facility] [phone] (P) 07/06/2013, 9:17 AM

## 2013-07-06 NOTE — Progress Notes (Signed)
Report called to [facility], [place]. All questions answered. Patient transported via [place]

## 2013-07-06 NOTE — Progress Notes (Signed)
Physical Therapy Treatment Patient Details Name: [name] MRN: [id number] DOB: 11-Aug-1946 Today's Date: 07/06/2013 Time: 4098-1191 PT Time Calculation (min): 45 min  PT Assessment / Plan / Recommendation  History of Present Illness 67 y/o male with multiple medical problems and multiple chronic ortho issues presents this hospital admission for repair of R tibia nonunion.  Pt also with hypogonadism/testosterone deficiency as well and is also on vitamin D deficiency which are contributing to prolonged healing course and multiple fxs    PT Comments   Focus on treatment today on LE strength and transfers.  Doing better with transfers with less assist.  Still will need SNF level rehab prior to return for surgery on right hip.  Follow Up Recommendations  SNF     Does the patient have the potential to tolerate intense rehabilitation   N/A  Barriers to Discharge  None      Equipment Recommendations  None recommended by PT    Recommendations for Other Services  None  Frequency Min 5X/week   Progress towards PT Goals Progress towards PT goals: Progressing toward goals  Plan Current plan remains appropriate    Precautions / Restrictions Precautions Precautions: Back Required Braces or Orthoses: Spinal Brace Restrictions RLE Weight Bearing: Non weight bearing   Pertinent Vitals/Pain 5-6/10 right ankle with dependency    Mobility  Bed Mobility Bed Mobility: Sit to Supine Supine to Sit: 5: Supervision;With rails Sitting - Scoot to Edge of Bed: 5: Supervision Sit to Supine: 4: Min assist Details for Bed Mobility Assistance: assist for right LE to supine Transfers Sit to Stand: 4: Min guard;From bed;With upper extremity assist Stand to Sit: To bed;4: Min guard;With upper extremity assist Details for Transfer Assistance: stood x 2 for strength and endurance Ambulation/Gait Ambulation/Gait Assistance: Not tested (comment) (Pt c/o sore left LE due to hopping yesterday so  deferred)    Exercises General Exercises - Lower Extremity Ankle Circles/Pumps: AROM;Both;10 reps;Supine Short Arc Quad: AROM;Right;10 reps;Supine Heel Slides: AAROM;AROM;Both;10 reps;Supine Straight Leg Raises: AAROM;Right;10 reps;Supine     PT Goals (current goals can now be found in the care plan section)    Visit Information  Last PT Received On: 07/06/13 Assistance Needed: +1 History of Present Illness: 67 y/o male with multiple medical problems and multiple chronic ortho issues presents this hospital admission for repair of R tibia nonunion.  Pt also with hypogonadism/testosterone deficiency as well and is also on vitamin D deficiency which are contributing to prolonged healing course and multiple fxs     Subjective Data   Supposed to go to [facility] today.   Cognition  Cognition Arousal/Alertness: Awake/alert Behavior During Therapy: WFL for tasks assessed/performed Overall Cognitive Status: Within Functional Limits for tasks assessed    Balance  Balance Balance Assessed: Yes Static Standing Balance Static Standing - Balance Support: Bilateral upper extremity supported Static Standing - Level of Assistance: 5: Stand by assistance Static Standing - Comment/# of Minutes: standing with UE assist x about 2 minutes  End of Session PT - End of Session Equipment Utilized During Treatment:  (wrist splint and PRAFO right foot) Activity Tolerance: Patient tolerated treatment well Patient left: in bed;with call bell/phone within reach   GP     [facility] 07/06/2013, 1:52 PM [place], [place] [phone] 07/06/2013

## 2013-07-06 NOTE — Progress Notes (Signed)
[facility] Progress Note  Subjective  Doing well Ready to go to snf  No new issues Pain controlled  Motivated  Review of Systems  Constitutional: Negative for fever and chills.  Eyes: Negative for blurred vision.  Respiratory: Negative for shortness of breath and wheezing.   Cardiovascular: Negative for chest pain and palpitations.  Gastrointestinal: Negative for nausea, vomiting and abdominal pain.  Neurological: Negative for tingling, sensory change and headaches.     Objective   BP 136/58  Pulse 71  Temp(Src) 98.7 F (37.1 C) (Oral)  Resp 18  Ht 6\' 4"  (1.93 m)  Wt 135.501 kg (298 lb 11.6 oz)  BMI 36.38 kg/m2  SpO2 98%  Intake/Output     09/25 0701 - 09/26 0700 09/26 0701 - 09/27 0700   P.O. 240 240   I.V. (mL/kg)     Total Intake(mL/kg) 240 (1.8) 240 (1.8)   Urine (mL/kg/hr) 400 (0.1) 200 (0.8)   Total Output 400 200   Net -160 +40          Labs  Urine culture 3d ago Specimen Description  URINE, CATHETERIZED   Special Requests  NONE   Culture Setup Time  07/03/2013 09:46 Performed at [facility]  Colony Count  >=100,000 COLONIES/ML Performed at [facility]  Culture  PROTEUS MIRABILIS Performed at [facility]  Report Status  07/05/2013 FINAL   Organism ID, Bacteria  PROTEUS MIRABILIS   Resulting Agency [facility]  Results for DAVAN, NAWABI (MRN [id number]) as of 07/06/2013 08:51  Ref. Range 07/03/2013 16:36  Immunofixation, Urine No range found (NOTE)  Time-UPE24 No range found RANDOM  Volume, Urine-UPE24 No range found RANDOM  Total Protein, Urine-UPE24 No range found 16.3  Total Protein, Urine-Ur/day Latest Range: 10-140 mg/day NOT CALC  ALBUMIN, U Latest Range: DETECTED  DETECTED  Alpha 1, Urine Latest Range: NONE DETECTED  DETECTED (A)  Alpha 2, Urine Latest Range: NONE DETECTED  DETECTED (A)  Beta, Urine Latest Range: NONE DETECTED  DETECTED (A)  Gamma Globulin, Urine Latest Range: NONE  DETECTED  DETECTED (A)  Free Kappa Lt Chains,Ur Latest Range: 0.14-2.42 mg/dL 40.98 (H)  Free Lt Chn Excr Rate No range found NOT CALC  Free Lambda Lt Chains,Ur Latest Range: 0.02-0.67 mg/dL 1.19 (H)  Free Lambda Excretion/Day No range found NOT CALC  Free Kappa/Lambda Ratio Latest Range: 2.04-10.37 ratio 15.30 (H)     Results for REYNOLD, MANTELL (MRN [id number]) as of 07/06/2013 08:51  Ref. Range 07/03/2013 07:20  Kappa free light chain Latest Range: 0.33-1.94 mg/dL 1.30 (H)  Lamda free light chains Latest Range: 0.57-2.63 mg/dL 8.65 (H)  Kappa, lamda light chain ratio Latest Range: 0.26-1.65  0.97      Exam  Gen: awake and alert, NAD, appears comfortable Lungs: CTA B   Cardiac: S1 and S2 Abd: + BS, NT Ext           Right Lower Extremity                         Incision looks excellent   No signs of infection      No drainage                        Distal motor and sensory functions intact                       Ext warm                       +  DP pulse                       Compartments soft and NT                       No DCT                       No acute changes     Assessment and Plan   POD 3   67 y/o male s/p Repair of R tibial shaft nonunion POD 3  1. R tibial shaft Nonunion s/p revision ORIF, POD3           NWB X 8 weeks           Ankle motion as tolerated           Ice and elevate           Dressing changed  Ok to wash wound with soap and water, dry dressing            PT/OT consults  2. R THA femoral component loosening           Surgery for this delayed due to # 1             Dr. Charlann Boxer to address surgically in 3-4 weeks           Total hip precautions  3. Lumbar compression fx           Up with brace on             Dr. Shon Baton following              4. Chronic nonunion/malunion R distal radius           Dr. Amanda Pea following  5. Afib           Home meds including amiodarone and Xarelto            6. Medical issues           Continue with home  meds  7. Metabolic bone disease           Pt with persistently low testosterone levels despite therapy             Prolactin levels normal, TSH levels normal thus not likely related to pituitary dysfunction             no evidence of multiple myeloma on SPEP/UPEP but findings are abnormal. ? If renal parenchymal disease or autoimmune disease. Abnormalities could also be due to acute inflammatory process  Have ordered additional labs including ANA, Anti-DSDNA ab, will follow up  Will discuss with colleagues at  [facility] fracture liaison service   Other potential consults include Renal, Rheum, Heme/Onc and Endocrine. We will complete further workup as an outpt     Continue with vitamin D and calcium supplementation   Continue with testosterone supplementation   DO NOT GIVE BISPHOSPHONATES    8. DVT/PE prophylaxis           Xarelto   9. Sleep apnea           CPAP  10. Pain             Continue with current regimen  11. UTI           Cx + for proteus   Will convert to PO abx x 21 days, ceftin   12. ID  Pt on chronic doxycycline use for dental issues             13. FEN           diet as tolerated              14. Dispo           PT/OT           f/u with ortho in 10 days           SNF      Mearl Latin, PA-C [facility] [phone] (P) 07/06/2013 8:49 AM

## 2013-07-06 NOTE — Clinical Social Work Placement (Signed)
Clinical Social Work Department  CLINICAL SOCIAL WORK PLACEMENT NOTE  Patient: [name] Account Number: [id number]  Admit date: 07/03/13  Clinical Social Worker: Sabino Niemann LCSWA Date/time: 07/05/13 3:30 PM  Clinical Social Work is seeking post-discharge placement for this patient at the following level of care: SKILLED NURSING (*CSW will update this form in Epic as items are completed)  07/05/13 Patient/family provided with [facility] Department of Clinical Social Work's list of facilities offering this level of care within the geographic area requested by the patient (or if unable, by the patient's family).  9/25/14Patient/family informed of their freedom to choose among providers that offer the needed level of care, that participate in Medicare, Medicaid or managed care program needed by the patient, have an available bed and are willing to accept the patient.  07/05/13 Patient/family informed of [facility]' ownership interest in [facility], as well as of the fact that they are under no obligation to receive care at this facility.  PASARR submitted to [employer] on 07/05/13  PASARR number received from [employer] on 07/05/13  FL2 transmitted to all facilities in geographic area requested by pt/family on 07/05/13  FL2 transmitted to all facilities within larger geographic area on  Patient informed that his/her managed care company has contracts with or will negotiate with certain facilities, including the following:  Patient/family informed of bed offers received: 07/05/13  Patient chooses bed at [facility] Physician recommends and patient chooses bed at  Patient to be transferred to on 07/06/2013 Patient to be transferred to facility by [facility] The following physician request were entered in Epic:  Additional Comments:  Sabino Niemann, MSW, LCSWA  [phone]  Signing off

## 2013-07-07 LAB — HEPATITIS B CORE ANTIBODY, TOTAL: Hep B Core Total Ab: NEGATIVE

## 2013-07-08 LAB — ANAEROBIC CULTURE

## 2013-07-08 NOTE — Discharge Summary (Signed)
I also spoke with Renal Service, who did not identify any specific underlying renal disease evident in urine electrophoresis, and who recommended endocrine evaluation.  Myrene Galas, MD [facility] [phone] (p)

## 2013-07-09 ENCOUNTER — Other Ambulatory Visit: Payer: Self-pay | Admitting: *Deleted

## 2013-07-09 LAB — ANCA SCREEN W REFLEX TITER
Atypical p-ANCA Screen: NEGATIVE
c-ANCA Screen: NEGATIVE
p-ANCA Screen: NEGATIVE

## 2013-07-09 LAB — GLOMERULAR BASEMENT MEMBRANE ANTIBODIES: GBM Ab: <1 AU/mL (ref <20)

## 2013-07-09 LAB — ANTI-DNA ANTIBODY, DOUBLE-STRANDED: ds DNA Ab: 3 IU/mL (ref <30)

## 2013-07-09 MED ORDER — TESTOSTERONE CYPIONATE 200 MG/ML IM SOLN: 200.0000 mg | INTRAMUSCULAR | Status: DC

## 2013-07-09 MED ORDER — CLONAZEPAM 2 MG PO TABS: 1.0000 mg | ORAL_TABLET | Freq: Three times a day (TID) | ORAL | Status: DC

## 2013-07-10 ENCOUNTER — Non-Acute Institutional Stay (SKILLED_NURSING_FACILITY): Payer: Medicare Other | Admitting: Internal Medicine

## 2013-07-10 DIAGNOSIS — M810 Age-related osteoporosis without current pathological fracture: Secondary | ICD-10-CM

## 2013-07-10 DIAGNOSIS — L408 Other psoriasis: Secondary | ICD-10-CM

## 2013-07-10 DIAGNOSIS — S82201S Unspecified fracture of shaft of right tibia, sequela: Secondary | ICD-10-CM

## 2013-07-10 DIAGNOSIS — K219 Gastro-esophageal reflux disease without esophagitis: Secondary | ICD-10-CM

## 2013-07-10 DIAGNOSIS — R7989 Other specified abnormal findings of blood chemistry: Secondary | ICD-10-CM

## 2013-07-10 DIAGNOSIS — S8290XS Unspecified fracture of unspecified lower leg, sequela: Secondary | ICD-10-CM

## 2013-07-10 DIAGNOSIS — I4891 Unspecified atrial fibrillation: Secondary | ICD-10-CM

## 2013-07-10 DIAGNOSIS — E039 Hypothyroidism, unspecified: Secondary | ICD-10-CM

## 2013-07-10 DIAGNOSIS — I2510 Atherosclerotic heart disease of native coronary artery without angina pectoris: Secondary | ICD-10-CM

## 2013-07-10 DIAGNOSIS — L409 Psoriasis, unspecified: Secondary | ICD-10-CM

## 2013-07-10 DIAGNOSIS — E559 Vitamin D deficiency, unspecified: Secondary | ICD-10-CM

## 2013-07-10 DIAGNOSIS — R5381 Other malaise: Secondary | ICD-10-CM

## 2013-07-10 DIAGNOSIS — M8448XD Pathological fracture, other site, subsequent encounter for fracture with routine healing: Secondary | ICD-10-CM

## 2013-07-10 DIAGNOSIS — M8000XG Age-related osteoporosis with current pathological fracture, unspecified site, subsequent encounter for fracture with delayed healing: Secondary | ICD-10-CM

## 2013-07-10 DIAGNOSIS — E291 Testicular hypofunction: Secondary | ICD-10-CM

## 2013-07-10 DIAGNOSIS — IMO0002 Reserved for concepts with insufficient information to code with codable children: Secondary | ICD-10-CM

## 2013-07-10 DIAGNOSIS — I10 Essential (primary) hypertension: Secondary | ICD-10-CM

## 2013-07-10 DIAGNOSIS — S32010S Wedge compression fracture of first lumbar vertebra, sequela: Secondary | ICD-10-CM

## 2013-07-10 DIAGNOSIS — D649 Anemia, unspecified: Secondary | ICD-10-CM

## 2013-07-10 LAB — HCV RNA QUANT RFLX ULTRA OR GENOTYP: HCV Quantitative: NOT DETECTED IU/mL — ABNORMAL LOW (ref <15)

## 2013-07-10 NOTE — Progress Notes (Signed)
Patient ID: [name], male   DOB: [birth year], 67 y.o.   MRN: [id number] Provider:  [name]. [name], D.O., C.M.D. Location:  [facility]  PCP: Irena Reichmann, DO  Code Status: full code   No Known Allergies  Chief Complaint  Patient presents with  . Hospitalization Follow-up    new admission s/p hospitalization for closed fracture of his tibia    HPI: 67 y.o. 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 His PMH is complicated by frequent falls that had been due in part to his psychiatric medications, hypothyroidism, low back pain, psoriasis, OSA, afib, depression, severe problems with anxiety for many years, hyperlipidemia, obesity, hip fx, and a lumbar vertebral compression fracture.  He's also previously fractured his right forearm.    ROS: Review of Systems  Constitutional: Positive for malaise/fatigue. Negative for fever and chills.  HENT: Negative for congestion.   Eyes: Negative for blurred vision.  Respiratory: Negative for shortness of breath.   Cardiovascular: Negative for chest pain.       Afib  Gastrointestinal: Positive for constipation. Negative for abdominal pain.  Genitourinary: Negative for dysuria, urgency and frequency.  Musculoskeletal: Positive for back pain and joint pain.  Skin: Negative for rash.  Neurological: Positive for dizziness. Negative for loss of consciousness, weakness and headaches.  Endo/Heme/Allergies: Bruises/bleeds easily.  Psychiatric/Behavioral: Positive for depression. Negative for memory loss. The patient is nervous/anxious.      Past Medical History  Diagnosis Date  . Acid reflux   . Thyroid disease     HX GRAVES DISEASE  . Low testosterone, possible hypogonadism 10/01/2012  . Lumbago   . High cholesterol   . Anxiety   . Depression   . CAD (coronary artery disease)   . Arthritis   .  Environmental allergies   . Vertebral compression fracture     L1-wears brace  . Sleep apnea     cpap machine-settings 17  . Transfusion history     s/p 12'13 hip surgery  . A-fib     hx.- sinus rhythm now  . Fracture of wrist 10/23/13    LEFT  . Osteoporosis   . Metabolic bone disease   . Plaque psoriasis    Past Surgical History  Procedure Laterality Date  . Knee surgery Bilateral 2012    Total knee replacements  . Appendectomy    . Right femur surgery  05/2012  . Orif wrist fracture  10/02/2012    Procedure: OPEN REDUCTION INTERNAL FIXATION (ORIF) WRIST FRACTURE;  Surgeon: Dominica Severin, MD;  Location: [facility];  Service: Orthopedics;  Laterality: Right;  WITH   ANTIBIOTIC  CEMENT  . Total hip revision  10/05/2012    Procedure: TOTAL HIP REVISION;  Surgeon: Shelda Pal, MD;  Location: [facility];  Service: Orthopedics;  Laterality: Right;  RIGHT TOTAL HIP REVISION  . Hardware removal  10/05/2012    Procedure: HARDWARE REMOVAL;  Surgeon: Shelda Pal, MD;  Location: [facility];  Service: Orthopedics;  Laterality: Right;  REMOVING  STRYKER  GAMMA NAIL  . Wrist fracture surgery  05/2012  . Orif tibia fracture Right 02/06/2013    Procedure: OPEN REDUCTION INTERNAL FIXATION (ORIF) TIBIA FRACTURE WITH IM ROD FIBULA;  Surgeon: Budd Palmer, MD;  Location: [facility];  Service: Orthopedics;  Laterality: Right;  . Cardiac catheterization  2013  . Orif tibia fracture Right 07/03/2013  Procedure: RIGHT TIBIA NON UNION REPAIR ;  Surgeon: Budd Palmer, MD;  Location: [facility];  Service: Orthopedics;  Laterality: Right;  . Hardware removal Right 07/03/2013    Procedure: HARDWARE REMOVAL RIGHT TIBIA ;  Surgeon: Budd Palmer, MD;  Location: [facility] OR;  Service: Orthopedics;  Laterality: Right;  . Joint replacement      hip-right x2  . Tonsillectomy    . Total hip revision Right 09/17/2013    Procedure: REVISION RIGHT TOTAL HIP ARTHROPLASTY ;  Surgeon: Shelda Pal, MD;  Location: [facility];  Service:  Orthopedics;  Laterality: Right;  . Hip closed reduction Right 10/15/2013    Procedure: CLOSED MANIPULATION HIP;  Surgeon: Shelda Pal, MD;  Location: [facility];  Service: Orthopedics;  Laterality: Right;  . Total hip revision Right 10/26/2013    Procedure: REVISION RIGHT TOTAL HIP ARTHROPLASTY;  Surgeon: Shelda Pal, MD;  Location: [facility];  Service: Orthopedics;  Laterality: Right;  . Orif wrist fracture Left 10/28/2013    Procedure: OPEN REDUCTION INTERNAL FIXATION (ORIF) WRIST FRACTURE with allograft;  Surgeon: Dominica Severin, MD;  Location: [facility];  Service: Orthopedics;  Laterality: Left;  DVR Plate  . Incision and drainage hip Right 11/16/2013    Procedure: IRRIGATION AND DEBRIDEMENT RIGHT HIP;  Surgeon: Shelda Pal, MD;  Location: [facility];  Service: Orthopedics;  Laterality: Right;   Social History:   reports that he quit smoking about 17 months ago. He has never used smokeless tobacco. He reports that he drinks alcohol. He reports that he does not use illicit drugs.  Family History  Problem Relation Age of Onset  . CAD Father 47  . Arthritis Mother     Medications: Patient's Medications  New Prescriptions   DOCUSATE SODIUM 100 MG CAPS    Take 100 mg by mouth 2 (two) times daily.   RIFAMPIN (RIFADIN) 300 MG CAPSULE    Take 1 capsule (300 mg total) by mouth every 12 (twelve) hours.   SODIUM CHLORIDE 0.9 % SOLN 250 ML WITH VANCOMYCIN 10 G SOLR 1,250 MG    Inject 1,250 mg into the vein every 12 (twelve) hours.   TESTOSTERONE CYPIONATE (DEPOTESTOTERONE CYPIONATE) 200 MG/ML INJECTION    Inject 200mg  (1ml) intramuscularly every 14 days  Previous Medications   AMIODARONE (PACERONE) 100 MG TABLET    Take 100 mg by mouth daily.   ASCORBIC ACID (VITAMIN C) 1000 MG TABLET    Take 1,000 mg by mouth daily.   ATORVASTATIN (LIPITOR) 80 MG TABLET    Take 80 mg by mouth every evening.    CHOLECALCIFEROL (VITAMIN D3) 5000 UNITS CAPS    Take 5,000 Units by mouth daily.   DILTIAZEM (CARDIZEM CD) 240  MG 24 HR CAPSULE    Take 240 mg by mouth every morning.    DULOXETINE (CYMBALTA) 60 MG CAPSULE    Take 60 mg by mouth 2 (two) times daily.   ETANERCEPT (ENBREL SURECLICK) 50 MG/ML INJECTION    Inject 50 mg into the skin once a week. On hold. Next dose due 11/30/13   FERROUS SULFATE 325 (65 FE) MG TABLET    Take 325 mg by mouth 2 (two) times daily with a meal.   LEVOTHYROXINE (SYNTHROID, LEVOTHROID) 125 MCG TABLET    Take 250 mcg by mouth daily before breakfast.   LORATADINE (CLARITIN) 10 MG TABLET    Take 10 mg by mouth daily.   MULTIPLE VITAMIN (MULTIVITAMIN WITH MINERALS) TABS TABLET  Take 1 tablet by mouth daily.   NITROGLYCERIN (NITROSTAT) 0.4 MG SL TABLET    Place 0.4 mg under the tongue every 5 (five) minutes as needed for chest pain. x3 doses as needed for chest pain   OMEPRAZOLE (PRILOSEC) 20 MG CAPSULE    Take 20 mg by mouth daily.   OYSTER SHELL (OYSTER CALCIUM) 500 MG TABS TABLET    Take 500 mg of elemental calcium by mouth 2 (two) times daily.   POLYETHYLENE GLYCOL (MIRALAX / GLYCOLAX) PACKET    Take 17 g by mouth daily as needed. Constipation.   POLYETHYLENE GLYCOL 400 (BLINK TEARS) 0.25 % SOLN    Apply 1 drop to eye 2 (two) times daily as needed (dry eyes).   RANOLAZINE (RANEXA) 500 MG 12 HR TABLET    Take 500 mg by mouth 2 (two) times daily.   RIVAROXABAN (XARELTO) 20 MG TABS    Take 20 mg by mouth daily with supper.   SACCHAROMYCES BOULARDII (FLORASTOR) 250 MG CAPSULE    Take 250 mg by mouth daily.   TAMSULOSIN HCL (FLOMAX) 0.4 MG CAPS    Take 0.4 mg by mouth every evening.    TERIPARATIDE, RECOMBINANT, (FORTEO [place])    Inject 20 mg into the skin daily.    TESTOSTERONE TD    Place 200 mg onto the skin every 14 (fourteen) days.   TUBERCULIN 5 UNIT/0.1ML INJECTION    Inject 5 Units into the skin once.  Modified Medications   Modified Medication Previous Medication   CLONAZEPAM (KLONOPIN) 1 MG TABLET clonazePAM (KLONOPIN) 1 MG tablet      Take one tablet by mouth three times daily  and qhs prn anxiety    Take one tablet by mouth three times daily   METHOCARBAMOL (ROBAXIN) 500 MG TABLET methocarbamol (ROBAXIN) 500 MG tablet      Take 1 tablet (500 mg total) by mouth every 6 (six) hours as needed for muscle spasms.    Take 1 tablet (500 mg total) by mouth every 6 (six) hours as needed for muscle spasms.   OXYCODONE (OXY IR/ROXICODONE) 5 MG IMMEDIATE RELEASE TABLET oxyCODONE (OXY IR/ROXICODONE) 5 MG immediate release tablet      Take one to three tablets by mouth every 4 hours as needed for pain    Take 1-3 tablets (5-15 mg total) by mouth every 4 (four) hours as needed for severe pain (5mg  pain level 1-4, 10mg  pain level 5-7, 15 mg pain level 8-10).  Discontinued Medications   AMIODARONE (PACERONE) 200 MG TABLET    Take 100 mg by mouth daily.   AMOXICILLIN (AMOXIL) 500 MG CAPSULE    Take 2,000 mg by mouth See admin instructions. Prior to dental exams every 6 months   CALCIUM CARBONATE 1500 MG TABS    Take 1 tablet by mouth daily.   CHOLECALCIFEROL (VITAMIN D) 2000 UNITS CAPS    Take 1 capsule by mouth daily.   CLONAZEPAM (KLONOPIN) 2 MG TABLET    Take 0.5 tablets (1 mg total) by mouth 3 (three) times daily.   DOCUSATE SODIUM 100 MG CAPS    Take 100 mg by mouth 2 (two) times daily.   DOXYCYCLINE (VIBRAMYCIN) 100 MG CAPSULE    Take 100 mg by mouth daily.    ENBREL SURECLICK 50 MG/ML INJECTION    Inject 50 mg as directed once a week. Last  Dose last dose was 10/19/2013   LEVOTHYROXINE (SYNTHROID, LEVOTHROID) 125 MCG TABLET    Take 2 tablets (250  mcg total) by mouth daily before breakfast.   METHOCARBAMOL (ROBAXIN) 500 MG TABLET    Take 1-2 tablets (500-1,000 mg total) by mouth every 6 (six) hours as needed.   METOPROLOL TARTRATE (LOPRESSOR) 25 MG TABLET    Take 25 mg by mouth daily as needed. Take for elevated heart rate. Hold for sbp < 110, hr <60   MULTIPLE VITAMIN (MULTIVITAMIN WITH MINERALS) TABS    Take 1 tablet by mouth daily.   OMEPRAZOLE (PRILOSEC) 20 MG CAPSULE    Take  20 mg by mouth daily.   OXYCODONE (OXY IR/ROXICODONE) 5 MG IMMEDIATE RELEASE TABLET    Take 1-2 tablets (5-10 mg total) by mouth every 3 (three) hours as needed.   OXYCODONE-ACETAMINOPHEN (PERCOCET/ROXICET) 5-325 MG PER TABLET    Take 1-2 tablets by mouth every 6 (six) hours as needed.   PROBIOTIC PRODUCT (PROBIOTIC PO)    Take 1 tablet by mouth every morning.    TESTOSTERONE CYPIONATE (DEPOTESTOTERONE CYPIONATE) 200 MG/ML INJECTION    Inject 1 mL (200 mg total) into the muscle every 14 (fourteen) days.   Physical Exam: Filed Vitals:   07/10/13 1103  BP: 148/68  Pulse: 81  Temp: 97.3 F (36.3 C)  Resp: 18  Height: 6\' 4"  (1.93 m)  Weight: 298 lb (135.172 kg)   Physical Exam  Constitutional: He is oriented to person, place, and time. He appears well-developed and well-nourished. No distress.  HENT:  Head: Normocephalic and atraumatic.  Right Ear: External ear normal.  Left Ear: External ear normal.  Nose: Nose normal.  Mouth/Throat: Oropharynx is clear and moist. No oropharyngeal exudate.  Eyes: Conjunctivae and EOM are normal. Pupils are equal, round, and reactive to light.  Neck: Normal range of motion. Neck supple. No JVD present.  Cardiovascular: Normal rate, regular rhythm, normal heart sounds and intact distal pulses.   Pulmonary/Chest: Effort normal and breath sounds normal. No respiratory distress. He has no wheezes. He has no rales.  Abdominal: Soft. Bowel sounds are normal. He exhibits no distension and no mass. There is no tenderness.  Musculoskeletal:  Boot on right foot/lower leg  Neurological: He is alert and oriented to person, place, and time.  Skin: Skin is warm and dry.  Psychiatric:  anxious     Labs reviewed: Basic Metabolic Panel:  Recent Labs  40/98/11 0415 11/18/13 0445 11/19/13 0925  NA 134* 137 132*  K 4.5 4.2 3.9  CL 99 101 95*  CO2 26 27 27   GLUCOSE 98 118* 123*  BUN 19 19 13   CREATININE 1.02 0.86 0.76  CALCIUM 7.8* 7.8* 8.0*   Liver  Function Tests:  Recent Labs  02/07/13 0610 02/08/13 0431 02/09/13 0530  AST 12 11 12   ALT 12 10 11   ALKPHOS 154* 147* 145*  BILITOT 0.4 0.4 0.4  PROT 5.6* 5.7* 5.8*  ALBUMIN 2.5* 2.5* 2.5*  CBC:  Recent Labs  07/03/13 0720  11/17/13 0853 11/18/13 0840 11/18/13 1500 11/19/13 0925  WBC 5.6  < > 6.2 4.8  --  4.9  NEUTROABS 3.0  --  5.0 3.2  --   --   HGB 12.0*  < > 7.5* 7.4* 7.2* 8.9*  HCT 38.5*  < > 24.5* 24.3* 23.5* 28.4*  MCV 76.4*  < > 82.8 83.2  --  83.0  PLT 268  < > 384 393  --  366  < > = values in this interval not displayed. Cardiac Enzymes:  Recent Labs  02/21/13 2146 02/22/13 0455 02/22/13 1009  TROPONINI <0.30 <0.30 <0.30  CBG:  Recent Labs  02/07/13 0643 02/07/13 1123 02/07/13 1614  GLUCAP 141* 103* 122*   Imaging and Procedures: 07/03/2013- Dr. Carola Frost  1. Repair of right tibia nonunion with infuse allografting.  2. Removal of broken, right tibia hardware.  3. Removal of right fibula hardware  Assessment/Plan 1. Right tibial fracture, sequela -s/p repair and removal of old hardware by Dr. Carola Frost -keep f/u appt with him -work with PT, OT here as instructed with goal to return home -pain controlled with current regimen  2. Pathological fracture due to osteoporosis with delayed healing -cont ca with D supplements, testosterone repletion, keep f/u with endocrinology  3. Atrial fibrillation -cont amiodarone 100mg  daily, xarelto, lopressor, cardizem  4. Vitamin D deficiency -cont calcium and vitamin D supplements  5. CAD (coronary artery disease) -cont statin, beta blocker, xarelto, ranexa, bp and lipid control  6. Low testosterone -cont repletion as per endocrinology  7. GERD (gastroesophageal reflux disease) -cont omeprazole  8. Hypothyroidism -cont synthroid  9. Psoriasis -on enbrel weekly for this, is immunocompromised so monitor especially carefully for infections  10. Physical deconditioning -here for therapy after  yet another surgical procedure, high fall risk--fall precautions in place  11. HTN (hypertension) -bp was elevated today--has some pain and anxiety which may be contributing, and has h/o dizziness and falls with fractures so I don't want to overdo his bp regimen  12. Anemia -not currently on iron -receiving testosterone injections which may help  13. Compression fracture of L1 lumbar vertebra, sequela -wears brace with intention of eventual surgical intervention if he can develop adequate bone strength to undergo the procedure  Functional status: currently requiring some assistance with bathing, dressing, transfers postop, had been ambulating with walker previously  Family/ staff Communication: discussed with pt, seen with unit supervisor

## 2013-07-11 ENCOUNTER — Inpatient Hospital Stay (HOSPITAL_COMMUNITY): Admission: RE | Admit: 2013-07-11 | Payer: Medicare Other | Source: Ambulatory Visit | Admitting: Orthopedic Surgery

## 2013-07-11 ENCOUNTER — Encounter (HOSPITAL_COMMUNITY): Admission: RE | Payer: Self-pay | Source: Ambulatory Visit

## 2013-07-11 SURGERY — TOTAL HIP REVISION
Anesthesia: Choice | Site: Hip | Laterality: Right

## 2013-08-03 DIAGNOSIS — G4733 Obstructive sleep apnea (adult) (pediatric): Secondary | ICD-10-CM | POA: Insufficient documentation

## 2013-08-03 DIAGNOSIS — Z96649 Presence of unspecified artificial hip joint: Secondary | ICD-10-CM | POA: Insufficient documentation

## 2013-08-03 DIAGNOSIS — I480 Paroxysmal atrial fibrillation: Secondary | ICD-10-CM | POA: Insufficient documentation

## 2013-08-03 DIAGNOSIS — N400 Enlarged prostate without lower urinary tract symptoms: Secondary | ICD-10-CM | POA: Insufficient documentation

## 2013-08-03 DIAGNOSIS — Z8719 Personal history of other diseases of the digestive system: Secondary | ICD-10-CM | POA: Insufficient documentation

## 2013-08-03 DIAGNOSIS — N138 Other obstructive and reflux uropathy: Secondary | ICD-10-CM | POA: Insufficient documentation

## 2013-08-06 ENCOUNTER — Ambulatory Visit
Admission: RE | Admit: 2013-08-06 | Discharge: 2013-08-06 | Disposition: A | Discharge status: Home / Self Care | Payer: 59 | Source: Ambulatory Visit | Attending: Orthopedic Surgery | Admitting: Orthopedic Surgery

## 2013-08-06 ENCOUNTER — Other Ambulatory Visit: Payer: Self-pay | Admitting: Orthopedic Surgery

## 2013-08-06 DIAGNOSIS — T148XXA Other injury of unspecified body region, initial encounter: Secondary | ICD-10-CM

## 2013-08-14 ENCOUNTER — Encounter: Payer: Self-pay | Admitting: Internal Medicine

## 2013-08-14 NOTE — Progress Notes (Signed)
Patient ID: [name], male   DOB: September 23, 1946, 67 y.o.   MRN: [id number] This encounter was created in error - please disregard.

## 2013-08-17 ENCOUNTER — Non-Acute Institutional Stay (SKILLED_NURSING_FACILITY): Payer: Medicare Other | Admitting: Internal Medicine

## 2013-08-17 DIAGNOSIS — K0510 Chronic gingivitis, plaque induced: Secondary | ICD-10-CM

## 2013-08-17 DIAGNOSIS — F329 Major depressive disorder, single episode, unspecified: Secondary | ICD-10-CM

## 2013-08-17 DIAGNOSIS — F419 Anxiety disorder, unspecified: Secondary | ICD-10-CM

## 2013-08-17 DIAGNOSIS — F411 Generalized anxiety disorder: Secondary | ICD-10-CM

## 2013-08-17 DIAGNOSIS — I2510 Atherosclerotic heart disease of native coronary artery without angina pectoris: Secondary | ICD-10-CM

## 2013-08-17 DIAGNOSIS — M545 Low back pain: Secondary | ICD-10-CM

## 2013-08-17 DIAGNOSIS — I4891 Unspecified atrial fibrillation: Secondary | ICD-10-CM

## 2013-08-17 DIAGNOSIS — E785 Hyperlipidemia, unspecified: Secondary | ICD-10-CM

## 2013-08-17 DIAGNOSIS — E039 Hypothyroidism, unspecified: Secondary | ICD-10-CM

## 2013-08-17 NOTE — Progress Notes (Signed)
Patient ID: [name], male   DOB: 12-Jul-1946, 67 y.o.   MRN: [id number]  Renette Butters living [employer]  Chief Complaint  Patient presents with  . Discharge Note   No Known Allergies  HPI 67 y/o male patient was here for short term rehab post repair of his right tibial fracture. He underwent rehab well and is stable to be discharged home today with his paperwork and home health service. He was seen in his room with his spouse present. He denies any concenrs or complaints no concern from spouse  Review of Systems  Constitutional: Negative for fever, chills and weight loss.  Eyes: Negative for blurred vision.  Respiratory: Negative for cough and shortness of breath.   Cardiovascular: Negative for chest pain.  Gastrointestinal: Negative for heartburn, nausea, vomiting, abdominal pain, diarrhea and constipation.  Genitourinary: Negative for dysuria.  Musculoskeletal: Negative for falls.  Skin: Negative for rash.  Neurological: Negative for dizziness, weakness and headaches.  Psychiatric/Behavioral: Negative for depression.    Past Medical History  Diagnosis Date  . Sleep apnea     cpap machine  . Acid reflux   . A-fib   . Thyroid disease   . Low testosterone, possible hypogonadism 10/01/2012  . Lumbago   . High cholesterol   . Anxiety   . Depression   . CAD (coronary artery disease)   . Hypertension     patient states he does not have high blood pressure  . Arthritis   . Environmental allergies   . Vertebral compression fracture    Past Surgical History  Procedure Laterality Date  . Knee surgery  2012  . Appendectomy    . Right femur surgery  05/2012  . Orif wrist fracture  10/02/2012    Procedure: OPEN REDUCTION INTERNAL FIXATION (ORIF) WRIST FRACTURE;  Surgeon: Dominica Severin, MD;  Location: [facility];  Service: Orthopedics;  Laterality: Right;  WITH   ANTIBIOTIC  CEMENT  . Total hip revision  10/05/2012    Procedure: TOTAL HIP REVISION;  Surgeon: Shelda Pal, MD;   Location: [facility];  Service: Orthopedics;  Laterality: Right;  RIGHT TOTAL HIP REVISION  . Hardware removal  10/05/2012    Procedure: HARDWARE REMOVAL;  Surgeon: Shelda Pal, MD;  Location: [facility];  Service: Orthopedics;  Laterality: Right;  REMOVING  STRYKER  GAMMA NAIL  . Wrist fracture surgery  05/2012  . Orif tibia fracture Right 02/06/2013    Procedure: OPEN REDUCTION INTERNAL FIXATION (ORIF) TIBIA FRACTURE WITH IM ROD FIBULA;  Surgeon: Budd Palmer, MD;  Location: [facility];  Service: Orthopedics;  Laterality: Right;  . Joint replacement      hip  . Cardiac catheterization  2013  . Orif tibia fracture Right 07/03/2013    Procedure: RIGHT TIBIA NON UNION REPAIR ;  Surgeon: Budd Palmer, MD;  [facility];  Service: Orthopedics;  Laterality: Right;  . Hardware removal Right 07/03/2013    Procedure: HARDWARE REMOVAL RIGHT TIBIA ;  Surgeon: Budd Palmer, MD;  Location: [facility] OR;  Service: Orthopedics;  Laterality: Right;   Current Outpatient Prescriptions on File Prior to Visit  Medication Sig Dispense Refill  . amiodarone (PACERONE) 200 MG tablet Take 100 mg by mouth daily.      . Ascorbic Acid (VITAMIN C) 1000 MG tablet Take 1,000 mg by mouth daily.      [name] atorvastatin (LIPITOR) 80 MG tablet Take 80 mg by mouth every evening.       . Calcium  Carbonate 1500 MG TABS Take 1 tablet by mouth daily.      . Cholecalciferol (VITAMIN D) 2000 UNITS CAPS Take 1 capsule by mouth daily.      . clonazePAM (KLONOPIN) 2 MG tablet Take 0.5 tablets (1 mg total) by mouth 3 (three) times daily.  90 tablet  2  . diltiazem (CARDIZEM CD) 240 MG 24 hr capsule Take 240 mg by mouth every morning.       [name] doxycycline (VIBRAMYCIN) 100 MG capsule Take 100 mg by mouth daily.       . DULoxetine (CYMBALTA) 60 MG capsule Take 60 mg by mouth 2 (two) times daily.      Elgie Collard SURECLICK 50 MG/ML injection 50 mg every 14 (fourteen) days.       [name] levothyroxine (SYNTHROID, LEVOTHROID) 125 MCG tablet Take 2 tablets (250  mcg total) by mouth daily before breakfast.      . loratadine (CLARITIN) 10 MG tablet Take 10 mg by mouth daily.      . methocarbamol (ROBAXIN) 500 MG tablet Take 1-2 tablets (500-1,000 mg total) by mouth every 6 (six) hours as needed.  60 tablet  0  . metoprolol tartrate (LOPRESSOR) 25 MG tablet Take 25 mg by mouth daily as needed. Take for elevated heart rate. Hold for sbp < 110, hr <60      . nitroGLYCERIN (NITROSTAT) 0.4 MG SL tablet Place 0.4 mg under the tongue every 5 (five) minutes as needed for chest pain. x3 doses as needed for chest pain      . oxyCODONE-acetaminophen (PERCOCET/ROXICET) 5-325 MG per tablet Take 1-2 tablets by mouth every 6 (six) hours as needed.  90 tablet  0  . polyethylene glycol (MIRALAX / GLYCOLAX) packet Take 17 g by mouth daily as needed. Constipation.      . Probiotic Product (PROBIOTIC PO) Take 1 tablet by mouth every morning.       . ranolazine (RANEXA) 500 MG 12 hr tablet Take 500 mg by mouth 2 (two) times daily.      . Rivaroxaban (XARELTO) 20 MG TABS Take 20 mg by mouth daily with supper.      . Tamsulosin HCl (FLOMAX) 0.4 MG CAPS Take 0.4 mg by mouth every evening.       . testosterone cypionate (DEPOTESTOTERONE CYPIONATE) 200 MG/ML injection Inject 1 mL (200 mg total) into the muscle every 14 (fourteen) days.  10 mL  5   No current facility-administered medications on file prior to visit.   Physical exam  BP 130/77  Pulse 74  Temp(Src) 98.1 F (36.7 C)  Resp 16  Ht 6\' 4"  (1.93 m)  Wt 305 lb (138.347 kg)  BMI 37.14 kg/m2  SpO2 96%   General- adult patient in NAD HEENT- PERRLA, EOMI, MMM, no JVD, no lymphadeopathy cvs- irregular heart rate, no murmurs/ rubs/ gallop respiratory- CTAB, no wheeze or rhonchi or crackles abdomen- bowel sound present, soft, non tender, no guarding or rigidity Extremities- able to move all 4, wheelchair bound, able to wheel himself, back brace in place Skin- no open sores/ wounds, warm and dry Neurology- no focal  deficit Psychiatry- alert and oriented to person,place and time, normal mood and affect  Labs reviewed- 07/11/13 wbc 4.9, hb 10.8, hct 33.4, mcv 73.1, plt 336, na 133, k 4.4, glu 94, bun 13, cr 0.90, ca 7.9, tsh 3.919, a1c 5.8, vit d 33  Assessment/plan  afib- rate controlled. Continue cardizem 240 mg daily with amiodarone for her rate control.  Will continue xarelto for stroke prophylaxis. Also on prn metoprolol  Hyperlipidemia- continue lipitor 80 mg daily  Anxiety- will continue the klonopin  Depression- continue cymbalta 60 mg daily with klonopin for his nerves  hypothyrodism- continue levothyroxine 125 mcg daily  CAD- remains chest pain free. Continue ranexa with his diltiazem  BPH- continue flomax for now  Low back pain and multiple fractures- continue his pain medication and muscle relaxant. Script will be provided  Will need home health for physical therapy at discharge. Arrangements made Script for a month on all medication provided To see dr Thomasena Edis -pcp on 08/20/13 at 11 am  Unclear abut prolonged use of doxycycline, pt mentions being on it for 4 years. Will provide 2 weeks supply on this and pt to follow with his pcp further  Reviewed care plan with patient, his spouse and nursing supervisor, answered their questions

## 2013-09-10 ENCOUNTER — Encounter (HOSPITAL_COMMUNITY): Payer: Self-pay | Admitting: Pharmacy Technician

## 2013-09-12 ENCOUNTER — Encounter (HOSPITAL_COMMUNITY): Payer: Self-pay

## 2013-09-12 ENCOUNTER — Encounter (HOSPITAL_COMMUNITY)
Admission: RE | Admit: 2013-09-12 | Discharge: 2013-09-12 | Disposition: A | Discharge status: Home / Self Care | Payer: Medicare Other | Source: Ambulatory Visit | Attending: Orthopedic Surgery | Admitting: Orthopedic Surgery

## 2013-09-12 DIAGNOSIS — T84099A Other mechanical complication of unspecified internal joint prosthesis, initial encounter: Secondary | ICD-10-CM | POA: Insufficient documentation

## 2013-09-12 DIAGNOSIS — Z96649 Presence of unspecified artificial hip joint: Secondary | ICD-10-CM | POA: Insufficient documentation

## 2013-09-12 DIAGNOSIS — Y831 Surgical operation with implant of artificial internal device as the cause of abnormal reaction of the patient, or of later complication, without mention of misadventure at the time of the procedure: Secondary | ICD-10-CM | POA: Insufficient documentation

## 2013-09-12 DIAGNOSIS — Z01812 Encounter for preprocedural laboratory examination: Secondary | ICD-10-CM | POA: Insufficient documentation

## 2013-09-12 HISTORY — DX: Personal history of other medical treatment: Z92.89

## 2013-09-12 LAB — CBC
HCT: 40.3 % (ref 39.0–52.0)
Hemoglobin: 12.6 g/dL — ABNORMAL LOW (ref 13.0–17.0)
MCHC: 31.3 g/dL (ref 30.0–36.0)
MCV: 75.9 fL — ABNORMAL LOW (ref 78.0–100.0)
RDW: 17.5 % — ABNORMAL HIGH (ref 11.5–15.5)

## 2013-09-12 LAB — SURGICAL PCR SCREEN: Staphylococcus aureus: NEGATIVE

## 2013-09-12 LAB — URINALYSIS, ROUTINE W REFLEX MICROSCOPIC
Ketones, ur: NEGATIVE mg/dL
Nitrite: NEGATIVE
Specific Gravity, Urine: 1.03 (ref 1.005–1.030)
Urobilinogen, UA: 1 mg/dL (ref 0.0–1.0)
pH: 6 (ref 5.0–8.0)

## 2013-09-12 LAB — URINE MICROSCOPIC-ADD ON

## 2013-09-12 LAB — BASIC METABOLIC PANEL
BUN: 18 mg/dL (ref 6–23)
CO2: 26 mEq/L (ref 19–32)
Chloride: 97 mEq/L (ref 96–112)
Creatinine, Ser: 0.9 mg/dL (ref 0.50–1.35)
Sodium: 132 mEq/L — ABNORMAL LOW (ref 135–145)

## 2013-09-12 LAB — PROTIME-INR
INR: 2.32 — ABNORMAL HIGH (ref 0.00–1.49)
Prothrombin Time: 24.7 seconds — ABNORMAL HIGH (ref 11.6–15.2)

## 2013-09-12 NOTE — Patient Instructions (Addendum)
[name]  09/12/2013   Your procedure is scheduled on: 12-8  -2014  Report to [facility] at   0915     AM .  Call this number if you have problems the morning of surgery: [phone]  Or Presurgical Testing [phone](Jacklyn Branan)    For Cpap use: Bring mask and tubing only.   Do not eat food:After Midnight.  May have clear liquids:up to 6 Hours before arrival. Nothing after : 0600 AM  Clear liquids include soda, tea, black coffee, apple or grape juice, broth.  Take these medicines the morning of surgery with A SIP OF WATER: Amiodarone.Clonazepam.Diltiazem.Doxycycline.Cymbalta.Levothyroxine.Loratadine. Omeprazole. Oxycodone. Ranexa. Bring eye drops. Bring Nitroglycerin-use as needed.   Do not wear jewelry, make-up or nail polish.  Do not wear lotions, powders, or perfumes. You may wear deodorant.  Do not shave 12 hours prior to first CHG shower(legs and under arms).(face and neck okay.)  Do not bring valuables to the hospital.  Contacts, dentures or removable bridgework, body piercing, hair pins may not be worn into surgery.  Leave suitcase in the car. After surgery it may be brought to your room.  For patients admitted to the hospital, checkout time is 11:00 AM the day of discharge.   Patients discharged the day of surgery will not be allowed to drive home. Must have responsible person with you x 24 hours once discharged.  Name and phone number of your driver: Michael [phone] cell  Special Instructions: CHG(Chlorhedine 4%-"Hibiclens","Betasept","Aplicare") Shower Use Special Wash: see special instructions.(avoid face and genitals)   Please read over the following fact sheets that you were given: MRSA Information, Blood Transfusion fact sheet, Incentive Spirometry Instruction.  Remember : Type/Screen "Blue armbands" - may not be removed once applied(would result in being retested if removed).  Failure to follow these instructions may result in  Cancellation of your surgery.   Patient signature_______________________________________________________

## 2013-09-12 NOTE — Pre-Procedure Instructions (Addendum)
09-12-13 EKG,Echo- 4'14-reports with chart. CXR 9'14 Epic 09-12-13 Patient made aware "Contact Isolation required" due to history of "ESBL". Note to Dr. Nilsa Nutting office to make him aware and labs now viewable in Epic-please note.W. Kennon Portela

## 2013-09-12 NOTE — Progress Notes (Signed)
09-12-13-1330-Labs viewable in Epic-note urinalysis, PT/INR(Xarelto to stop 09-15-13)the patient is required to be on Contact isolation for history "ESBL" previous admission.

## 2013-09-13 LAB — URINE CULTURE: Colony Count: 7000

## 2013-09-16 NOTE — H&P (Signed)
TOTAL HIP REVISION ADMISSION H&P  Patient is admitted for right revision total hip arthroplasty.  Subjective:  Chief Complaint:     Right hip pain  HPI: LELA GELL, 67 y.o. male, has a history of pain and functional disability in the right hip due to trauma. A In August of 2013 he fell, broke his hip and a total hip replacement was performed to fix the hip.  In December of 2013 he fell and caused a periprosthetic fracture on the right hip as well and wrist fracture.  Upon return to the clinic it was noted that he had almost no boney ingrowth of the prosthesis.  He was worked up for infection.  It was decided to revise the hip components.  While waiting to do hip surgery he rebroke his ankle through a plate and screws holding a previous fracture in place. He has also been diagnosed with compression fractures of the lumbar spine. Through all of the fractures he has sustained he saw an endocrinologist that diagnosed him with Metabolic Bone Disease.  He is now ready to proceed with a revision of the right total hip arthroplasty.  Risks, benefits and expectations were discussed with the patient.  Risks including but not limited to the risk of anesthesia, blood clots, nerve damage, blood vessel damage, failure of the prosthesis, infection and up to and including death.  Patient understand the risks, benefits and expectations and wishes to proceed with surgery.   D/C Plans:   SNF  Post-op Meds:    No Rx given  Tranexamic Acid:   Not to be given - CAD  Decadron:    Not to be given  FYI:    CPAP  Xarelto post-op  Oxycodone post-op   Patient Active Problem List   Diagnosis Date Noted  . Nonunion, fracture, Right tibia  07/03/2013  . UTI (urinary tract infection) 07/03/2013  . Pathological fracture due to osteoporosis with delayed healing 04/24/2013  . Osteoporosis with fracture 04/24/2013  . Rash and nonspecific skin eruption 03/08/2013  . Syncope 02/21/2013  . PAF (paroxysmal atrial  fibrillation) 02/21/2013  . Chronic anticoagulation 02/21/2013  . Fall 02/08/2013  . Physical deconditioning 02/08/2013  . Low back pain radiating to both legs 02/08/2013  . Compression fracture of L1 lumbar vertebra 02/08/2013  . Right tibial fracture 02/05/2013  . Obesity, unspecified 02/05/2013  . Osteomyelitis of right wrist 11/02/2012  . Anemia 10/06/2012  . HTN (hypertension) 10/03/2012  . Psoriasis 10/03/2012  . Dyslipidemia 10/03/2012  . Vitamin D deficiency 10/03/2012  . GERD (gastroesophageal reflux disease) 10/03/2012  . Hyperglycemia 10/03/2012  . Anxiety 10/03/2012  . Depression 10/03/2012  . CAD (coronary artery disease) 10/03/2012  . DJD (degenerative joint disease) 10/03/2012  . Chronic gingivitis 10/03/2012  . Low testosterone, possible hypogonadism 10/01/2012  . Normocytic anemia 09/30/2012  . Right femoral fracture 09/29/2012  . Right forearm fracture 09/29/2012  . Atrial fibrillation 09/29/2012  . Hypothyroidism 09/29/2012  . History of recurrent UTIs 09/29/2012   Past Medical History  Diagnosis Date  . Acid reflux   . Thyroid disease   . Low testosterone, possible hypogonadism 10/01/2012  . Lumbago   . High cholesterol   . Anxiety   . Depression   . CAD (coronary artery disease)   . Hypertension     patient states he does not have high blood pressure  . Arthritis   . Environmental allergies   . Vertebral compression fracture     L1-wears brace  . Sleep  apnea     cpap machine-settings 17  . Transfusion history     s/p 12'13 hip surgery  . A-fib     hx.- sinus rhythm now    Past Surgical History  Procedure Laterality Date  . Knee surgery Bilateral 2012    Total knee replacements  . Appendectomy    . Right femur surgery  05/2012  . Orif wrist fracture  10/02/2012    Procedure: OPEN REDUCTION INTERNAL FIXATION (ORIF) WRIST FRACTURE;  Surgeon: Dominica Severin, MD;  Location: [facility];  Service: Orthopedics;  Laterality: Right;  WITH   ANTIBIOTIC   CEMENT  . Total hip revision  10/05/2012    Procedure: TOTAL HIP REVISION;  Surgeon: Shelda Pal, MD;  Location: [facility];  Service: Orthopedics;  Laterality: Right;  RIGHT TOTAL HIP REVISION  . Hardware removal  10/05/2012    Procedure: HARDWARE REMOVAL;  Surgeon: Shelda Pal, MD;  Location: [facility];  Service: Orthopedics;  Laterality: Right;  REMOVING  STRYKER  GAMMA NAIL  . Wrist fracture surgery  05/2012  . Orif tibia fracture Right 02/06/2013    Procedure: OPEN REDUCTION INTERNAL FIXATION (ORIF) TIBIA FRACTURE WITH IM ROD FIBULA;  Surgeon: Budd Palmer, MD;  Location: [facility] OR;  Service: Orthopedics;  Laterality: Right;  . Cardiac catheterization  2013  . Orif tibia fracture Right 07/03/2013    Procedure: RIGHT TIBIA NON UNION REPAIR ;  Surgeon: Budd Palmer, MD;  Location[facility];  Service: Orthopedics;  Laterality: Right;  . Hardware removal Right 07/03/2013    Procedure: HARDWARE REMOVAL RIGHT TIBIA ;  Surgeon: Budd Palmer, MD;  Location: [facility] OR;  Service: Orthopedics;  Laterality: Right;  . Joint replacement      hip-right x2  . Tonsillectomy      No prescriptions prior to admission   No Known Allergies   History  Substance Use Topics  . Smoking status: Former Smoker    Quit date: 06/05/2012  . Smokeless tobacco: Never Used  . Alcohol Use: Yes     Comment: occasional-social    Family History  Problem Relation Age of Onset  . CAD Father 58  . Arthritis Mother       Review of Systems  Constitutional: Negative.   HENT: Negative.   Eyes: Negative.   Respiratory: Positive for shortness of breath.   Cardiovascular: Negative.   Gastrointestinal: Negative.   Genitourinary: Positive for frequency.  Musculoskeletal: Positive for back pain and joint pain.  Skin: Positive for rash.  Neurological: Negative.   Endo/Heme/Allergies: Positive for environmental allergies.  Psychiatric/Behavioral: Positive for depression. The patient is nervous/anxious.      Objective:  Physical Exam  Constitutional: He is oriented to person, place, and time. He appears well-developed and well-nourished.  HENT:  Head: Normocephalic and atraumatic.  Mouth/Throat: Oropharynx is clear and moist.  Eyes: Pupils are equal, round, and reactive to light.  Neck: Neck supple. No JVD present. No tracheal deviation present. No thyromegaly present.  Cardiovascular: Normal rate, regular rhythm, normal heart sounds and intact distal pulses.   Respiratory: Effort normal and breath sounds normal. No stridor. No respiratory distress. He has no wheezes.  GI: Soft. There is no tenderness. There is no guarding.  Musculoskeletal:       Right hip: He exhibits decreased range of motion, decreased strength, tenderness, bony tenderness and laceration (healed). He exhibits no swelling, no crepitus and no deformity.  Lymphadenopathy:    He has no cervical adenopathy.  Neurological:  He is alert and oriented to person, place, and time.  Skin: Skin is warm and dry.  Psychiatric: He has a normal mood and affect.     Labs:  Estimated body mass index is 36.38 kg/(m^2) as calculated from the following:   Height as of 07/03/13: 6\' 4"  (1.93 m).   Weight as of 07/03/13: 135.501 kg (298 lb 11.6 oz).  Imaging Review:  Plain radiographs demonstrate previous total hip arthroplasty of the right hip(s). There is evidence of loosening of the possible non-union of the previous fracture.The bone quality appears to be poor for age and reported activity level.  Assessment/Plan:  Right hip(s) with failed previous arthroplasty.  The patient history, physical examination, clinical judgement of the provider and imaging studies are consistent with previous total hip arthroplasty with articular surface wear / non-union of the right hip(s), previous total hip arthroplasty. Revision total hip arthroplasty is deemed medically necessary. The treatment options including medical management, injection  therapy, arthroscopy and arthroplasty were discussed at length. The risks and benefits of total hip arthroplasty were presented and reviewed. The risks due to aseptic loosening, infection, stiffness, dislocation/subluxation,  thromboembolic complications and other imponderables were discussed.  The patient acknowledged the explanation, agreed to proceed with the plan and consent was signed. Patient is being admitted for inpatient treatment for surgery, pain control, PT, OT, prophylactic antibiotics, VTE prophylaxis, progressive ambulation and ADL's and discharge planning. The patient is planning to be discharged to skilled nursing facility.     Anastasio Auerbach Caroleena Paolini   PAC  09/16/2013, 5:46 PM

## 2013-09-17 ENCOUNTER — Inpatient Hospital Stay (HOSPITAL_COMMUNITY)
Admission: RE | Admit: 2013-09-17 | Discharge: 2013-09-20 | DRG: 467 | Disposition: A | Discharge status: Skilled Nursing Facility | Payer: Medicare Other | Source: Ambulatory Visit | Attending: Orthopedic Surgery | Admitting: Orthopedic Surgery

## 2013-09-17 ENCOUNTER — Inpatient Hospital Stay (HOSPITAL_COMMUNITY): Payer: Medicare Other

## 2013-09-17 ENCOUNTER — Inpatient Hospital Stay (HOSPITAL_COMMUNITY): Payer: Medicare Other | Admitting: *Deleted

## 2013-09-17 ENCOUNTER — Encounter (HOSPITAL_COMMUNITY): Payer: Self-pay | Admitting: *Deleted

## 2013-09-17 ENCOUNTER — Encounter (HOSPITAL_COMMUNITY): Payer: Medicare Other | Admitting: *Deleted

## 2013-09-17 ENCOUNTER — Encounter (HOSPITAL_COMMUNITY)
Admission: RE | Disposition: A | Discharge status: Skilled Nursing Facility | Payer: Self-pay | Source: Ambulatory Visit | Attending: Orthopedic Surgery

## 2013-09-17 DIAGNOSIS — Z7901 Long term (current) use of anticoagulants: Secondary | ICD-10-CM

## 2013-09-17 DIAGNOSIS — E291 Testicular hypofunction: Secondary | ICD-10-CM | POA: Diagnosis present

## 2013-09-17 DIAGNOSIS — K219 Gastro-esophageal reflux disease without esophagitis: Secondary | ICD-10-CM | POA: Diagnosis present

## 2013-09-17 DIAGNOSIS — E7800 Pure hypercholesterolemia, unspecified: Secondary | ICD-10-CM | POA: Diagnosis present

## 2013-09-17 DIAGNOSIS — Z87311 Personal history of (healed) other pathological fracture: Secondary | ICD-10-CM

## 2013-09-17 DIAGNOSIS — E785 Hyperlipidemia, unspecified: Secondary | ICD-10-CM | POA: Diagnosis present

## 2013-09-17 DIAGNOSIS — Z01812 Encounter for preprocedural laboratory examination: Secondary | ICD-10-CM

## 2013-09-17 DIAGNOSIS — G4730 Sleep apnea, unspecified: Secondary | ICD-10-CM | POA: Diagnosis present

## 2013-09-17 DIAGNOSIS — Z6836 Body mass index (BMI) 36.0-36.9, adult: Secondary | ICD-10-CM

## 2013-09-17 DIAGNOSIS — Z87891 Personal history of nicotine dependence: Secondary | ICD-10-CM

## 2013-09-17 DIAGNOSIS — Z96659 Presence of unspecified artificial knee joint: Secondary | ICD-10-CM

## 2013-09-17 DIAGNOSIS — F329 Major depressive disorder, single episode, unspecified: Secondary | ICD-10-CM | POA: Diagnosis present

## 2013-09-17 DIAGNOSIS — E871 Hypo-osmolality and hyponatremia: Secondary | ICD-10-CM

## 2013-09-17 DIAGNOSIS — F3289 Other specified depressive episodes: Secondary | ICD-10-CM | POA: Diagnosis present

## 2013-09-17 DIAGNOSIS — D62 Acute posthemorrhagic anemia: Secondary | ICD-10-CM | POA: Diagnosis not present

## 2013-09-17 DIAGNOSIS — M810 Age-related osteoporosis without current pathological fracture: Secondary | ICD-10-CM | POA: Diagnosis present

## 2013-09-17 DIAGNOSIS — M899 Disorder of bone, unspecified: Secondary | ICD-10-CM | POA: Diagnosis present

## 2013-09-17 DIAGNOSIS — M1990 Unspecified osteoarthritis, unspecified site: Secondary | ICD-10-CM | POA: Diagnosis present

## 2013-09-17 DIAGNOSIS — E039 Hypothyroidism, unspecified: Secondary | ICD-10-CM | POA: Diagnosis present

## 2013-09-17 DIAGNOSIS — T84039A Mechanical loosening of unspecified internal prosthetic joint, initial encounter: Principal | ICD-10-CM | POA: Diagnosis present

## 2013-09-17 DIAGNOSIS — Z96649 Presence of unspecified artificial hip joint: Secondary | ICD-10-CM

## 2013-09-17 DIAGNOSIS — Y831 Surgical operation with implant of artificial internal device as the cause of abnormal reaction of the patient, or of later complication, without mention of misadventure at the time of the procedure: Secondary | ICD-10-CM | POA: Diagnosis present

## 2013-09-17 DIAGNOSIS — I10 Essential (primary) hypertension: Secondary | ICD-10-CM | POA: Diagnosis present

## 2013-09-17 DIAGNOSIS — I4891 Unspecified atrial fibrillation: Secondary | ICD-10-CM | POA: Diagnosis present

## 2013-09-17 DIAGNOSIS — D500 Iron deficiency anemia secondary to blood loss (chronic): Secondary | ICD-10-CM

## 2013-09-17 DIAGNOSIS — I2510 Atherosclerotic heart disease of native coronary artery without angina pectoris: Secondary | ICD-10-CM | POA: Diagnosis present

## 2013-09-17 DIAGNOSIS — Z8249 Family history of ischemic heart disease and other diseases of the circulatory system: Secondary | ICD-10-CM

## 2013-09-17 DIAGNOSIS — F411 Generalized anxiety disorder: Secondary | ICD-10-CM | POA: Diagnosis present

## 2013-09-17 DIAGNOSIS — E669 Obesity, unspecified: Secondary | ICD-10-CM | POA: Diagnosis present

## 2013-09-17 HISTORY — PX: TOTAL HIP REVISION: SHX763

## 2013-09-17 LAB — HEMOGLOBIN AND HEMATOCRIT, BLOOD
HCT: 31.9 % — ABNORMAL LOW (ref 39.0–52.0)
Hemoglobin: 10.5 g/dL — ABNORMAL LOW (ref 13.0–17.0)

## 2013-09-17 LAB — GRAM STAIN

## 2013-09-17 LAB — PROTIME-INR: Prothrombin Time: 12.9 seconds (ref 11.6–15.2)

## 2013-09-17 SURGERY — TOTAL HIP REVISION
Anesthesia: General | Site: Hip | Laterality: Right

## 2013-09-17 MED ORDER — 0.9 % SODIUM CHLORIDE (POUR BTL) OPTIME
TOPICAL | Status: DC | PRN
  Administered 2013-09-17: 1000 mL

## 2013-09-17 MED ORDER — HYDROMORPHONE HCL PF 1 MG/ML IJ SOLN
INTRAMUSCULAR | Status: AC
  Filled 2013-09-17: qty 1

## 2013-09-17 MED ORDER — DIPHENHYDRAMINE HCL 25 MG PO CAPS
25.0000 mg | ORAL_CAPSULE | Freq: Four times a day (QID) | ORAL | Status: DC | PRN
  Administered 2013-09-18: 01:00:00 25 mg via ORAL
  Filled 2013-09-17: qty 1

## 2013-09-17 MED ORDER — LEVOTHYROXINE SODIUM 125 MCG PO TABS
250.0000 ug | ORAL_TABLET | Freq: Every day | ORAL | Status: DC
  Administered 2013-09-18 – 2013-09-20 (×3): 250 ug via ORAL
  Filled 2013-09-17 (×4): qty 2

## 2013-09-17 MED ORDER — CHLORHEXIDINE GLUCONATE 4 % EX LIQD: 60.0000 mL | Freq: Once | CUTANEOUS | Status: DC

## 2013-09-17 MED ORDER — EPHEDRINE SULFATE 50 MG/ML IJ SOLN
INTRAMUSCULAR | Status: DC | PRN
  Administered 2013-09-17: 10 mg via INTRAVENOUS
  Administered 2013-09-17: 20 mg via INTRAVENOUS
  Administered 2013-09-17: 10 mg via INTRAVENOUS
  Administered 2013-09-17: 5 mg via INTRAVENOUS
  Administered 2013-09-17: 15 mg via INTRAVENOUS
  Administered 2013-09-17: 10 mg via INTRAVENOUS
  Administered 2013-09-17: 20 mg via INTRAVENOUS
  Administered 2013-09-17: 5 mg via INTRAVENOUS
  Administered 2013-09-17 (×2): 10 mg via INTRAVENOUS

## 2013-09-17 MED ORDER — PHENYLEPHRINE HCL 10 MG/ML IJ SOLN
INTRAMUSCULAR | Status: AC
  Filled 2013-09-17: qty 2

## 2013-09-17 MED ORDER — PROMETHAZINE HCL 25 MG/ML IJ SOLN: 6.2500 mg | INTRAMUSCULAR | Status: DC | PRN

## 2013-09-17 MED ORDER — PHENYLEPHRINE HCL 10 MG/ML IJ SOLN
20.0000 mg | INTRAVENOUS | Status: DC | PRN
  Administered 2013-09-17: 100 ug/min via INTRAVENOUS

## 2013-09-17 MED ORDER — ALBUMIN HUMAN 5 % IV SOLN
INTRAVENOUS | Status: AC
  Filled 2013-09-17: qty 250

## 2013-09-17 MED ORDER — CISATRACURIUM BESYLATE (PF) 10 MG/5ML IV SOLN
INTRAVENOUS | Status: DC | PRN
  Administered 2013-09-17 (×2): 4 mg via INTRAVENOUS
  Administered 2013-09-17: 8 mg via INTRAVENOUS
  Administered 2013-09-17: 4 mg via INTRAVENOUS

## 2013-09-17 MED ORDER — POLYETHYLENE GLYCOL 3350 17 G PO PACK
17.0000 g | PACK | Freq: Two times a day (BID) | ORAL | Status: DC
  Administered 2013-09-17 – 2013-09-20 (×6): 17 g via ORAL

## 2013-09-17 MED ORDER — NEOSTIGMINE METHYLSULFATE 1 MG/ML IJ SOLN
INTRAMUSCULAR | Status: DC | PRN
  Administered 2013-09-17: 5 mg via INTRAVENOUS

## 2013-09-17 MED ORDER — FENTANYL CITRATE 0.05 MG/ML IJ SOLN
INTRAMUSCULAR | Status: AC
  Filled 2013-09-17: qty 2

## 2013-09-17 MED ORDER — FENTANYL CITRATE 0.05 MG/ML IJ SOLN
INTRAMUSCULAR | Status: DC | PRN
  Administered 2013-09-17 (×4): 50 ug via INTRAVENOUS

## 2013-09-17 MED ORDER — LACTATED RINGERS IV SOLN
INTRAVENOUS | Status: DC | PRN
  Administered 2013-09-17: 15:00:00 via INTRAVENOUS

## 2013-09-17 MED ORDER — PHENOL 1.4 % MT LIQD: 1.0000 | OROMUCOSAL | Status: DC | PRN

## 2013-09-17 MED ORDER — VANCOMYCIN HCL IN DEXTROSE 1-5 GM/200ML-% IV SOLN
1000.0000 mg | Freq: Two times a day (BID) | INTRAVENOUS | Status: AC
  Administered 2013-09-17: 1000 mg via INTRAVENOUS
  Filled 2013-09-17: qty 200

## 2013-09-17 MED ORDER — POLYETHYLENE GLYCOL 400 0.25 % OP SOLN: 1.0000 [drp] | Freq: Two times a day (BID) | OPHTHALMIC | Status: DC | PRN

## 2013-09-17 MED ORDER — TAMSULOSIN HCL 0.4 MG PO CAPS
0.4000 mg | ORAL_CAPSULE | Freq: Every evening | ORAL | Status: DC
  Administered 2013-09-17 – 2013-09-19 (×3): 0.4 mg via ORAL
  Filled 2013-09-17 (×4): qty 1

## 2013-09-17 MED ORDER — MENTHOL 3 MG MT LOZG
1.0000 | LOZENGE | OROMUCOSAL | Status: DC | PRN
  Filled 2013-09-17: qty 9

## 2013-09-17 MED ORDER — POLYVINYL ALCOHOL 1.4 % OP SOLN
1.0000 [drp] | Freq: Two times a day (BID) | OPHTHALMIC | Status: DC | PRN
  Filled 2013-09-17: qty 15

## 2013-09-17 MED ORDER — GLYCOPYRROLATE 0.2 MG/ML IJ SOLN
INTRAMUSCULAR | Status: DC | PRN
  Administered 2013-09-17 (×2): 0.2 mg via INTRAVENOUS
  Administered 2013-09-17: .8 mg via INTRAVENOUS

## 2013-09-17 MED ORDER — HYDROMORPHONE HCL PF 1 MG/ML IJ SOLN
0.5000 mg | INTRAMUSCULAR | Status: DC | PRN
  Administered 2013-09-17 – 2013-09-18 (×2): 2 mg via INTRAVENOUS
  Filled 2013-09-17 (×2): qty 2

## 2013-09-17 MED ORDER — HYDROCODONE-ACETAMINOPHEN 7.5-325 MG PO TABS
1.0000 | ORAL_TABLET | ORAL | Status: DC
  Administered 2013-09-17 – 2013-09-19 (×10): 2 via ORAL
  Administered 2013-09-19: 17:00:00 1 via ORAL
  Administered 2013-09-19: 2 via ORAL
  Administered 2013-09-20: 13:00:00 1 via ORAL
  Administered 2013-09-20 (×3): 2 via ORAL
  Filled 2013-09-17 (×16): qty 2

## 2013-09-17 MED ORDER — PROPOFOL 10 MG/ML IV BOLUS
INTRAVENOUS | Status: DC | PRN
  Administered 2013-09-17: 150 mg via INTRAVENOUS

## 2013-09-17 MED ORDER — FERROUS SULFATE 325 (65 FE) MG PO TABS
325.0000 mg | ORAL_TABLET | Freq: Three times a day (TID) | ORAL | Status: DC
  Administered 2013-09-18 – 2013-09-20 (×8): 325 mg via ORAL
  Filled 2013-09-17 (×10): qty 1

## 2013-09-17 MED ORDER — CELECOXIB 200 MG PO CAPS
200.0000 mg | ORAL_CAPSULE | Freq: Two times a day (BID) | ORAL | Status: DC
  Administered 2013-09-17 – 2013-09-20 (×6): 200 mg via ORAL
  Filled 2013-09-17 (×7): qty 1

## 2013-09-17 MED ORDER — SODIUM CHLORIDE 0.9 % IR SOLN
Status: DC | PRN
  Administered 2013-09-17: 6000 mL

## 2013-09-17 MED ORDER — DOCUSATE SODIUM 100 MG PO CAPS
100.0000 mg | ORAL_CAPSULE | Freq: Two times a day (BID) | ORAL | Status: DC
  Administered 2013-09-17 – 2013-09-20 (×6): 100 mg via ORAL

## 2013-09-17 MED ORDER — TOBRAMYCIN SULFATE 1.2 G IJ SOLR
INTRAMUSCULAR | Status: DC | PRN
  Administered 2013-09-17: 3

## 2013-09-17 MED ORDER — POTASSIUM CHLORIDE 2 MEQ/ML IV SOLN
100.0000 mL/h | INTRAVENOUS | Status: DC
  Administered 2013-09-17: 23:00:00 100 mL/h via INTRAVENOUS
  Filled 2013-09-17 (×8): qty 1000

## 2013-09-17 MED ORDER — METOCLOPRAMIDE HCL 5 MG/ML IJ SOLN: 5.0000 mg | Freq: Three times a day (TID) | INTRAMUSCULAR | Status: DC | PRN

## 2013-09-17 MED ORDER — ONDANSETRON HCL 4 MG PO TABS: 4.0000 mg | ORAL_TABLET | Freq: Four times a day (QID) | ORAL | Status: DC | PRN

## 2013-09-17 MED ORDER — VITAMIN D3 125 MCG (5000 UT) PO CAPS: 5000.0000 [IU] | ORAL_CAPSULE | Freq: Every day | ORAL | Status: DC

## 2013-09-17 MED ORDER — ATORVASTATIN CALCIUM 80 MG PO TABS
80.0000 mg | ORAL_TABLET | Freq: Every evening | ORAL | Status: DC
  Administered 2013-09-17 – 2013-09-19 (×3): 80 mg via ORAL
  Filled 2013-09-17 (×4): qty 1

## 2013-09-17 MED ORDER — 0.9 % SODIUM CHLORIDE (POUR BTL) OPTIME
TOPICAL | Status: DC | PRN
  Administered 2013-09-17: 500 mL

## 2013-09-17 MED ORDER — KETAMINE HCL 10 MG/ML IJ SOLN
INTRAMUSCULAR | Status: DC | PRN
  Administered 2013-09-17: 125 mg via INTRAVENOUS
  Administered 2013-09-17: 75 mg via INTRAVENOUS

## 2013-09-17 MED ORDER — METHOCARBAMOL 500 MG PO TABS
500.0000 mg | ORAL_TABLET | Freq: Four times a day (QID) | ORAL | Status: DC | PRN
  Administered 2013-09-18 – 2013-09-20 (×6): 500 mg via ORAL
  Filled 2013-09-17 (×6): qty 1

## 2013-09-17 MED ORDER — HYDROMORPHONE HCL PF 1 MG/ML IJ SOLN
0.2500 mg | INTRAMUSCULAR | Status: DC | PRN
  Administered 2013-09-17 (×2): 0.25 mg via INTRAVENOUS
  Administered 2013-09-17 (×2): 0.5 mg via INTRAVENOUS
  Administered 2013-09-17: 0.25 mg via INTRAVENOUS

## 2013-09-17 MED ORDER — VITAMIN D3 25 MCG (1000 UNIT) PO TABS
5000.0000 [IU] | ORAL_TABLET | Freq: Every day | ORAL | Status: DC
  Administered 2013-09-18 – 2013-09-20 (×3): 5000 [IU] via ORAL
  Filled 2013-09-17 (×3): qty 5

## 2013-09-17 MED ORDER — DEXAMETHASONE SODIUM PHOSPHATE 10 MG/ML IJ SOLN
10.0000 mg | Freq: Once | INTRAMUSCULAR | Status: AC
  Administered 2013-09-18: 15:00:00 10 mg via INTRAVENOUS
  Filled 2013-09-17: qty 1

## 2013-09-17 MED ORDER — DILTIAZEM HCL ER COATED BEADS 240 MG PO CP24
240.0000 mg | ORAL_CAPSULE | Freq: Every morning | ORAL | Status: DC
  Administered 2013-09-18 – 2013-09-20 (×3): 240 mg via ORAL
  Filled 2013-09-17 (×3): qty 1

## 2013-09-17 MED ORDER — MEPERIDINE HCL 50 MG/ML IJ SOLN: 6.2500 mg | INTRAMUSCULAR | Status: DC | PRN

## 2013-09-17 MED ORDER — EPHEDRINE SULFATE 50 MG/ML IJ SOLN
INTRAMUSCULAR | Status: AC
  Filled 2013-09-17: qty 2

## 2013-09-17 MED ORDER — ONDANSETRON HCL 4 MG/2ML IJ SOLN
INTRAMUSCULAR | Status: AC
  Filled 2013-09-17: qty 2

## 2013-09-17 MED ORDER — VANCOMYCIN HCL 1000 MG IV SOLR
INTRAVENOUS | Status: DC | PRN
  Administered 2013-09-17: 3 g

## 2013-09-17 MED ORDER — VANCOMYCIN HCL 1000 MG IV SOLR
INTRAVENOUS | Status: AC
  Filled 2013-09-17: qty 3000

## 2013-09-17 MED ORDER — MIDAZOLAM HCL 5 MG/5ML IJ SOLN
INTRAMUSCULAR | Status: DC | PRN
  Administered 2013-09-17: 2 mg via INTRAVENOUS

## 2013-09-17 MED ORDER — LACTATED RINGERS IV SOLN
INTRAVENOUS | Status: DC
  Administered 2013-09-17 (×2): via INTRAVENOUS
  Administered 2013-09-17: 1000 mL via INTRAVENOUS

## 2013-09-17 MED ORDER — DULOXETINE HCL 60 MG PO CPEP
60.0000 mg | ORAL_CAPSULE | Freq: Two times a day (BID) | ORAL | Status: DC
  Administered 2013-09-17 – 2013-09-20 (×6): 60 mg via ORAL
  Filled 2013-09-17 (×7): qty 1

## 2013-09-17 MED ORDER — FLEET ENEMA 7-19 GM/118ML RE ENEM: 1.0000 | ENEMA | Freq: Once | RECTAL | Status: AC | PRN

## 2013-09-17 MED ORDER — DEXAMETHASONE SODIUM PHOSPHATE 10 MG/ML IJ SOLN
INTRAMUSCULAR | Status: AC
  Filled 2013-09-17: qty 1

## 2013-09-17 MED ORDER — METOCLOPRAMIDE HCL 5 MG/ML IJ SOLN
INTRAMUSCULAR | Status: AC
  Filled 2013-09-17: qty 2

## 2013-09-17 MED ORDER — BISACODYL 10 MG RE SUPP: 10.0000 mg | Freq: Every day | RECTAL | Status: DC | PRN

## 2013-09-17 MED ORDER — PROPOFOL 10 MG/ML IV BOLUS
INTRAVENOUS | Status: AC
  Filled 2013-09-17: qty 20

## 2013-09-17 MED ORDER — FENTANYL CITRATE 0.05 MG/ML IJ SOLN
INTRAMUSCULAR | Status: AC
  Filled 2013-09-17: qty 5

## 2013-09-17 MED ORDER — METHOCARBAMOL 100 MG/ML IJ SOLN
500.0000 mg | Freq: Four times a day (QID) | INTRAMUSCULAR | Status: DC | PRN
  Administered 2013-09-17: 500 mg via INTRAVENOUS
  Filled 2013-09-17: qty 5

## 2013-09-17 MED ORDER — MIDAZOLAM HCL 2 MG/2ML IJ SOLN
INTRAMUSCULAR | Status: AC
  Filled 2013-09-17: qty 2

## 2013-09-17 MED ORDER — METOCLOPRAMIDE HCL 5 MG/ML IJ SOLN
INTRAMUSCULAR | Status: DC | PRN
  Administered 2013-09-17: 10 mg via INTRAVENOUS

## 2013-09-17 MED ORDER — AMIODARONE HCL 100 MG PO TABS
100.0000 mg | ORAL_TABLET | Freq: Every day | ORAL | Status: DC
  Administered 2013-09-18 – 2013-09-20 (×3): 100 mg via ORAL
  Filled 2013-09-17 (×3): qty 1

## 2013-09-17 MED ORDER — ALUM & MAG HYDROXIDE-SIMETH 200-200-20 MG/5ML PO SUSP: 30.0000 mL | ORAL | Status: DC | PRN

## 2013-09-17 MED ORDER — SODIUM CHLORIDE 0.9 % IV SOLN
INTRAVENOUS | Status: DC | PRN
  Administered 2013-09-17: 14:00:00 via INTRAVENOUS

## 2013-09-17 MED ORDER — SUCCINYLCHOLINE CHLORIDE 20 MG/ML IJ SOLN
INTRAMUSCULAR | Status: DC | PRN
  Administered 2013-09-17: 100 mg via INTRAVENOUS

## 2013-09-17 MED ORDER — SODIUM CHLORIDE 0.9 % IV BOLUS (SEPSIS)
500.0000 mL | Freq: Once | INTRAVENOUS | Status: AC
  Administered 2013-09-17: 500 mL via INTRAVENOUS

## 2013-09-17 MED ORDER — SUCCINYLCHOLINE CHLORIDE 20 MG/ML IJ SOLN
INTRAMUSCULAR | Status: AC
  Filled 2013-09-17: qty 1

## 2013-09-17 MED ORDER — VITAMIN C 500 MG PO TABS
1000.0000 mg | ORAL_TABLET | Freq: Every day | ORAL | Status: DC
  Administered 2013-09-18 – 2013-09-20 (×3): 1000 mg via ORAL
  Filled 2013-09-17 (×3): qty 2

## 2013-09-17 MED ORDER — RANOLAZINE ER 500 MG PO TB12
500.0000 mg | ORAL_TABLET | Freq: Two times a day (BID) | ORAL | Status: DC
  Administered 2013-09-17 – 2013-09-20 (×6): 500 mg via ORAL
  Filled 2013-09-17 (×7): qty 1

## 2013-09-17 MED ORDER — RIVAROXABAN 20 MG PO TABS
20.0000 mg | ORAL_TABLET | ORAL | Status: DC
  Administered 2013-09-18 – 2013-09-20 (×3): 20 mg via ORAL
  Filled 2013-09-17 (×4): qty 1

## 2013-09-17 MED ORDER — PANTOPRAZOLE SODIUM 40 MG PO TBEC
40.0000 mg | DELAYED_RELEASE_TABLET | Freq: Every day | ORAL | Status: DC
  Administered 2013-09-18 – 2013-09-20 (×3): 40 mg via ORAL
  Filled 2013-09-17 (×3): qty 1

## 2013-09-17 MED ORDER — CEFAZOLIN SODIUM 10 G IJ SOLR
3.0000 g | INTRAMUSCULAR | Status: AC
  Administered 2013-09-17: 3 g via INTRAVENOUS
  Filled 2013-09-17: qty 3000

## 2013-09-17 MED ORDER — SODIUM CHLORIDE 0.9 % IV BOLUS (SEPSIS)
1000.0000 mL | Freq: Once | INTRAVENOUS | Status: AC
  Administered 2013-09-17: 1000 mL via INTRAVENOUS

## 2013-09-17 MED ORDER — NEOSTIGMINE METHYLSULFATE 1 MG/ML IJ SOLN
INTRAMUSCULAR | Status: AC
  Filled 2013-09-17: qty 10

## 2013-09-17 MED ORDER — GLYCOPYRROLATE 0.2 MG/ML IJ SOLN
INTRAMUSCULAR | Status: AC
  Filled 2013-09-17: qty 4

## 2013-09-17 MED ORDER — NITROGLYCERIN 0.4 MG SL SUBL: 0.4000 mg | SUBLINGUAL_TABLET | SUBLINGUAL | Status: DC | PRN

## 2013-09-17 MED ORDER — CLONAZEPAM 1 MG PO TABS
1.0000 mg | ORAL_TABLET | Freq: Three times a day (TID) | ORAL | Status: DC
  Administered 2013-09-17 – 2013-09-20 (×8): 1 mg via ORAL
  Filled 2013-09-17 (×8): qty 1

## 2013-09-17 MED ORDER — ZOLPIDEM TARTRATE 5 MG PO TABS: 5.0000 mg | ORAL_TABLET | Freq: Every evening | ORAL | Status: DC | PRN

## 2013-09-17 MED ORDER — TOBRAMYCIN SULFATE 1.2 G IJ SOLR
INTRAMUSCULAR | Status: AC
  Filled 2013-09-17: qty 3.6

## 2013-09-17 MED ORDER — LORATADINE 10 MG PO TABS
10.0000 mg | ORAL_TABLET | Freq: Every day | ORAL | Status: DC
  Administered 2013-09-18 – 2013-09-20 (×3): 10 mg via ORAL
  Filled 2013-09-17 (×3): qty 1

## 2013-09-17 MED ORDER — LACTATED RINGERS IV SOLN: INTRAVENOUS | Status: DC

## 2013-09-17 MED ORDER — ONDANSETRON HCL 4 MG/2ML IJ SOLN: 4.0000 mg | Freq: Four times a day (QID) | INTRAMUSCULAR | Status: DC | PRN

## 2013-09-17 MED ORDER — METOCLOPRAMIDE HCL 10 MG PO TABS: 5.0000 mg | ORAL_TABLET | Freq: Three times a day (TID) | ORAL | Status: DC | PRN

## 2013-09-17 MED ORDER — VANCOMYCIN HCL IN DEXTROSE 1-5 GM/200ML-% IV SOLN
INTRAVENOUS | Status: AC
  Filled 2013-09-17: qty 200

## 2013-09-17 MED ORDER — ALBUMIN HUMAN 5 % IV SOLN
12.5000 g | Freq: Once | INTRAVENOUS | Status: AC
  Administered 2013-09-17: 16:00:00 via INTRAVENOUS

## 2013-09-17 SURGICAL SUPPLY — 72 items
ADH SKN CLS APL DERMABOND .7 (GAUZE/BANDAGES/DRESSINGS) ×1
ARTICULEZE HEAD 36 12 (Hips) ×2 IMPLANT
BAG SPEC THK2 15X12 ZIP CLS (MISCELLANEOUS) ×1
BAG ZIPLOCK 12X15 (MISCELLANEOUS) ×2 IMPLANT
BLADE SAW SGTL 18X1.27X75 (BLADE) ×1 IMPLANT
BNDG COHESIVE 6X5 TAN STRL LF (GAUZE/BANDAGES/DRESSINGS) ×1 IMPLANT
BRUSH FEMORAL CANAL (MISCELLANEOUS) IMPLANT
CEMENT HV SMART SET (Cement) ×3 IMPLANT
CEMENT RESTRICTOR DEPUY SZ 7 (Cement) ×1 IMPLANT
DERMABOND ADVANCED (GAUZE/BANDAGES/DRESSINGS) ×1
DERMABOND ADVANCED .7 DNX12 (GAUZE/BANDAGES/DRESSINGS) ×1 IMPLANT
DRAPE INCISE IOBAN 66X45 STRL (DRAPES) ×1 IMPLANT
DRAPE INCISE IOBAN 85X60 (DRAPES) ×2 IMPLANT
DRAPE ORTHO SPLIT 77X108 STRL (DRAPES) ×4
DRAPE POUCH INSTRU U-SHP 10X18 (DRAPES) ×2 IMPLANT
DRAPE SURG 17X11 SM STRL (DRAPES) ×2 IMPLANT
DRAPE SURG ORHT 6 SPLT 77X108 (DRAPES) ×2 IMPLANT
DRAPE U-SHAPE 47X51 STRL (DRAPES) ×2 IMPLANT
DRSG AQUACEL AG ADV 3.5X10 (GAUZE/BANDAGES/DRESSINGS) IMPLANT
DRSG AQUACEL AG ADV 3.5X14 (GAUZE/BANDAGES/DRESSINGS) ×2 IMPLANT
DRSG EMULSION OIL 3X16 NADH (GAUZE/BANDAGES/DRESSINGS) ×2 IMPLANT
DRSG MEPILEX BORDER 4X4 (GAUZE/BANDAGES/DRESSINGS) ×1 IMPLANT
DRSG MEPILEX BORDER 4X8 (GAUZE/BANDAGES/DRESSINGS) ×1 IMPLANT
DRSG TEGADERM 4X4.75 (GAUZE/BANDAGES/DRESSINGS) ×1 IMPLANT
DURAPREP 26ML APPLICATOR (WOUND CARE) ×3 IMPLANT
ELECT BLADE TIP CTD 4 INCH (ELECTRODE) ×2 IMPLANT
ELECT REM PT RETURN 9FT ADLT (ELECTROSURGICAL) ×2
ELECTRODE REM PT RTRN 9FT ADLT (ELECTROSURGICAL) ×1 IMPLANT
EVACUATOR 1/8 PVC DRAIN (DRAIN) ×1 IMPLANT
FACESHIELD LNG OPTICON STERILE (SAFETY) ×8 IMPLANT
GAUZE SPONGE 2X2 8PLY STRL LF (GAUZE/BANDAGES/DRESSINGS) ×1 IMPLANT
GLOVE BIOGEL PI IND STRL 6.5 (GLOVE) IMPLANT
GLOVE BIOGEL PI IND STRL 7.0 (GLOVE) IMPLANT
GLOVE BIOGEL PI IND STRL 7.5 (GLOVE) ×1 IMPLANT
GLOVE BIOGEL PI IND STRL 8 (GLOVE) ×1 IMPLANT
GLOVE BIOGEL PI IND STRL 8.5 (GLOVE) IMPLANT
GLOVE BIOGEL PI INDICATOR 6.5 (GLOVE) ×3
GLOVE BIOGEL PI INDICATOR 7.0 (GLOVE) ×1
GLOVE BIOGEL PI INDICATOR 7.5 (GLOVE) ×2
GLOVE BIOGEL PI INDICATOR 8 (GLOVE) ×2
GLOVE BIOGEL PI INDICATOR 8.5 (GLOVE) ×1
GLOVE ECLIPSE 8.0 STRL XLNG CF (GLOVE) ×5 IMPLANT
GOWN BRE IMP PREV XXLGXLNG (GOWN DISPOSABLE) ×3 IMPLANT
GOWN PREVENTION PLUS LG XLONG (DISPOSABLE) ×2 IMPLANT
GOWN STRL REIN XL XLG (GOWN DISPOSABLE) ×2 IMPLANT
HANDPIECE INTERPULSE COAX TIP (DISPOSABLE) ×2
HEAD ARTICULEZE 36 12 (Hips) IMPLANT
KIT BASIN OR (CUSTOM PROCEDURE TRAY) ×2 IMPLANT
LINER NEUTRAL 60X36X58 P4 HIP (Liner) ×1 IMPLANT
MANIFOLD NEPTUNE II (INSTRUMENTS) ×2 IMPLANT
NS IRRIG 1000ML POUR BTL (IV SOLUTION) ×4 IMPLANT
PACK TOTAL JOINT (CUSTOM PROCEDURE TRAY) ×2 IMPLANT
POSITIONER SURGICAL ARM (MISCELLANEOUS) ×2 IMPLANT
PRESSURIZER FEMORAL UNIV (MISCELLANEOUS) IMPLANT
SET HNDPC FAN SPRY TIP SCT (DISPOSABLE) IMPLANT
SPONGE GAUZE 2X2 STER 10/PKG (GAUZE/BANDAGES/DRESSINGS)
SPONGE LAP 18X18 X RAY DECT (DISPOSABLE) ×3 IMPLANT
SPONGE LAP 4X18 X RAY DECT (DISPOSABLE) ×1 IMPLANT
STAPLER VISISTAT 35W (STAPLE) ×1 IMPLANT
STEM STR LG STATURE 8IN 15MM (Stem) ×1 IMPLANT
SUCTION FRAZIER TIP 10 FR DISP (SUCTIONS) ×2 IMPLANT
SUT MNCRL AB 3-0 PS2 18 (SUTURE) ×1 IMPLANT
SUT VIC AB 1 CT1 36 (SUTURE) ×6 IMPLANT
SUT VIC AB 2-0 CT1 27 (SUTURE) ×4
SUT VIC AB 2-0 CT1 TAPERPNT 27 (SUTURE) ×3 IMPLANT
SUT VLOC 180 0 24IN GS25 (SUTURE) ×3 IMPLANT
SWAB COLLECTION DEVICE MRSA (MISCELLANEOUS) ×1 IMPLANT
TOWEL OR 17X26 10 PK STRL BLUE (TOWEL DISPOSABLE) ×4 IMPLANT
TOWER CARTRIDGE SMART MIX (DISPOSABLE) ×1 IMPLANT
TRAY FOLEY METER SIL LF 16FR (CATHETERS) ×1 IMPLANT
TUBE ANAEROBIC SPECIMEN COL (MISCELLANEOUS) ×1 IMPLANT
WATER STERILE IRR 1500ML POUR (IV SOLUTION) ×2 IMPLANT

## 2013-09-17 NOTE — Anesthesia Preprocedure Evaluation (Signed)
Anesthesia Evaluation  Patient identified by MRN, date of birth, ID band Patient awake    Reviewed: Allergy & Precautions, H&P , NPO status , Patient's Chart, lab work & pertinent test results  Airway Mallampati: II TM Distance: >3 FB Neck ROM: full    Dental  (+) Teeth Intact   Pulmonary neg pulmonary ROS, sleep apnea , former smoker,          Cardiovascular hypertension, Pt. on medications + CAD + dysrhythmias Atrial Fibrillation     Neuro/Psych Anxiety Depression negative neurological ROS  negative psych ROS   GI/Hepatic negative GI ROS, Neg liver ROS, GERD-  ,  Endo/Other  Hypothyroidism obese  Renal/GU negative Renal ROS  negative genitourinary   Musculoskeletal negative musculoskeletal ROS (+)   Abdominal   Peds negative pediatric ROS (+)  Hematology negative hematology ROS (+)   Anesthesia Other Findings   Reproductive/Obstetrics negative OB ROS                           Anesthesia Physical  Anesthesia Plan  ASA: III  Anesthesia Plan: General   Post-op Pain Management:    Induction: Intravenous  Airway Management Planned: Oral ETT  Additional Equipment:   Intra-op Plan:   Post-operative Plan: Extubation in OR  Informed Consent: I have reviewed the patients History and Physical, chart, labs and discussed the procedure including the risks, benefits and alternatives for the proposed anesthesia with the patient or authorized representative who has indicated his/her understanding and acceptance.   Dental advisory given  Plan Discussed with: CRNA, Anesthesiologist and Surgeon  Anesthesia Plan Comments:         Anesthesia Quick Evaluation

## 2013-09-17 NOTE — Brief Op Note (Signed)
09/17/2013  4:23 PM  PATIENT:  Marcy Salvo  67 y.o. male  PRE-OPERATIVE DIAGNOSIS:  FAILED RIGHT TOTAL HIP ARTHROPLASTY   POST-OPERATIVE DIAGNOSIS:  FAILED RIGHT TOTAL HIP ARTHROPLASTY   PROCEDURE:  Procedure(s): REVISION RIGHT TOTAL HIP ARTHROPLASTY  (Right)  SURGEON:  Surgeon(s) and Role:    * Shelda Pal, MD - Primary  PHYSICIAN ASSISTANT: Lanney Gins, PA-C  ANESTHESIA:   general  EBL:  Total I/O In: 3150 [I.V.:2500; Blood:650] Out: 3000 [Urine:500; Blood:2500]  BLOOD ADMINISTERED:600 CC PRBC  DRAINS: none   LOCAL MEDICATIONS USED:  NONE  SPECIMEN:  Source of Specimen:  right hip  DISPOSITION OF SPECIMEN:  PATHOLOGY  COUNTS:  YES  TOURNIQUET:  * No tourniquets in log *  DICTATION: .Other Dictation: Dictation Number [id number]  PLAN OF CARE: Admit to inpatient   PATIENT DISPOSITION:  PACU - hemodynamically stable.   Delay start of Pharmacological VTE agent (>24hrs) due to surgical blood loss or risk of bleeding: no

## 2013-09-17 NOTE — Interval H&P Note (Signed)
History and Physical Interval Note:  09/17/2013 11:58 AM  [name]  has presented today for surgery, with the diagnosis of FAILED RIGHT TOTAL HIP ARTHROPLASTY   The various methods of treatment have been discussed with the patient and family. After consideration of risks, benefits and other options for treatment, the patient has consented to  Procedure(s): REVISION RIGHT TOTAL HIP ARTHROPLASTY  (Right) as a surgical intervention .  The patient's history has been reviewed, patient examined, no change in status, stable for surgery.  I have reviewed the patient's chart and labs.  Questions were answered to the patient's satisfaction.     Shelda Pal

## 2013-09-17 NOTE — Transfer of Care (Signed)
Immediate Anesthesia Transfer of Care Note  Patient: [name]  Procedure(s) Performed: Procedure(s): REVISION RIGHT TOTAL HIP ARTHROPLASTY  (Right)  Patient Location: PACU  Anesthesia Type:General  Level of Consciousness: awake, alert , sedated and patient cooperative  Airway & Oxygen Therapy: Patient Spontanous Breathing and Patient connected to face mask oxygen  Post-op Assessment: Report given to PACU RN and Post -op Vital signs reviewed and stable  Post vital signs: Reviewed and stable  Complications: No apparent anesthesia complications

## 2013-09-17 NOTE — Anesthesia Postprocedure Evaluation (Signed)
  Anesthesia Post-op Note  Patient: [name]  Procedure(s) Performed: Procedure(s) (LRB): REVISION RIGHT TOTAL HIP ARTHROPLASTY  (Right)  Patient Location: PACU  Anesthesia Type: General  Level of Consciousness: awake and alert   Airway and Oxygen Therapy: Patient Spontanous Breathing  Post-op Pain: mild  Post-op Assessment: Post-op Vital signs reviewed, Patient's Cardiovascular Status Stable, Respiratory Function Stable, Patent Airway and No signs of Nausea or vomiting  Last Vitals:  Filed Vitals:   09/17/13 1837  BP: 107/53  Pulse: 64  Temp: 36.4 C  Resp: 16    Post-op Vital Signs: stable   Complications: No apparent anesthesia complications

## 2013-09-17 NOTE — Plan of Care (Signed)
Problem: Consults Goal: Total Joint Replacement Patient Education See Patient Education Module for education specifics. Outcome: Completed/Met Date Met:  09/17/13 revision

## 2013-09-17 NOTE — Preoperative (Signed)
Beta Blockers   Reason not to administer Beta Blockers:Not Applicable, not on home BB 

## 2013-09-18 ENCOUNTER — Encounter (HOSPITAL_COMMUNITY): Payer: Self-pay | Admitting: Orthopedic Surgery

## 2013-09-18 DIAGNOSIS — E871 Hypo-osmolality and hyponatremia: Secondary | ICD-10-CM

## 2013-09-18 DIAGNOSIS — D500 Iron deficiency anemia secondary to blood loss (chronic): Secondary | ICD-10-CM

## 2013-09-18 LAB — CBC
HCT: 28.4 % — ABNORMAL LOW (ref 39.0–52.0)
MCH: 24.4 pg — ABNORMAL LOW (ref 26.0–34.0)
MCV: 76.1 fL — ABNORMAL LOW (ref 78.0–100.0)
RBC: 3.73 MIL/uL — ABNORMAL LOW (ref 4.22–5.81)
WBC: 6.6 10*3/uL (ref 4.0–10.5)

## 2013-09-18 LAB — BASIC METABOLIC PANEL
BUN: 18 mg/dL (ref 6–23)
CO2: 25 mEq/L (ref 19–32)
Chloride: 101 mEq/L (ref 96–112)
Creatinine, Ser: 1.02 mg/dL (ref 0.50–1.35)
Glucose, Bld: 119 mg/dL — ABNORMAL HIGH (ref 70–99)

## 2013-09-18 LAB — TYPE AND SCREEN
ABO/RH(D): A POS
Antibody Screen: NEGATIVE
Unit division: 0
Unit division: 0

## 2013-09-18 NOTE — Progress Notes (Signed)
Clinical Social Work Department BRIEF PSYCHOSOCIAL ASSESSMENT 09/18/2013  Patient:  [name], [name]     Account Number:  [id number]     Admit date:  09/17/2013  Clinical Social Worker:  Candie Chroman  Date/Time:  09/18/2013 12:34 PM  Referred by:  Physician  Date Referred:  09/18/2013 Referred for  SNF Placement   Other Referral:   Interview type:  Patient Other interview type:    PSYCHOSOCIAL DATA Living Status:  HUSBAND Admitted from facility:   Level of care:   Primary support name:  [name] Primary support relationship to patient:  SPOUSE Degree of support available:   supportive    CURRENT CONCERNS Current Concerns  Post-Acute Placement   Other Concerns:    SOCIAL WORK ASSESSMENT / PLAN Pt is a 67 yr old gentleman living at home prior to hospitalization. CSW met with pt to assist with d/c planning. Pt has made prior arrangements to have [facility] Rehab [facility] following hospital d/c. CSW has contacted SNF and d/c plans have been confirmed. CSW will continue to follow to assist with d/c planning to SNF.   Assessment/plan status:  Psychosocial Support/Ongoing Assessment of Needs Other assessment/ plan:   Information/referral to community resources:   None needed at this time. Pt has been to SNF in the past .    PATIENT'S/FAMILY'S RESPONSE TO PLAN OF CARE: " I'm looking forward to having rehab at [facility]. I've been there before and had a good experience."   [employer] LCSW [phone]

## 2013-09-18 NOTE — Progress Notes (Signed)
Physical Therapy Treatment Patient Details Name: [name] MRN: [id number] DOB: Feb 12, 1946 Today's Date: 09/18/2013 Time: 8295-6213 PT Time Calculation (min): 18 min  PT Assessment / Plan / Recommendation  History of Present Illness S/P Revision of posterior THA on 09/17/13. Pt also has healing R tibia fx with Cam boot. Has L1 ffx and uses back brace when up.   PT Comments   Pt demonstrated decreased control RLE this session. Cues for safety, R Cam boot hit RW. Pt reports feeling tired. HR 107 after  ambulating.  Follow Up Recommendations  SNF     Does the patient have the potential to tolerate intense rehabilitation     Barriers to Discharge        Equipment Recommendations  None recommended by PT    Recommendations for Other Services    Frequency 7X/week   Progress towards PT Goals Progress towards PT goals: Progressing toward goals  Plan Current plan remains appropriate    Precautions / Restrictions Precautions Precautions: Posterior Hip;Back;Fall Required Braces or Orthoses: Spinal Brace;Other Brace/Splint Spinal Brace: Applied in sitting position Other Brace/Splint: Cam boot for R. Restrictions Weight Bearing Restrictions: Yes RLE Weight Bearing: Partial weight bearing RLE Partial Weight Bearing Percentage or Pounds: 50   Pertinent Vitals/Pain Reports soreness in hip, ice applied. To get meds.    Mobility  Bed Mobility Bed Mobility: Sit to Supine Supine to Sit: 3: Mod assist Details for Bed Mobility Assistance: assist RLE onto bed, cues for precautions. Transfers Transfers: Sit to Stand;Stand to Sit Sit to Stand: 4: Min assist;With upper extremity assist;With armrests;From chair/3-in-1 Sit to Stand: Patient Percentage: 80% Stand to Sit: 4: Min assist Details for Transfer Assistance: cues for R TH precautions./RLE position Ambulation/Gait Ambulation/Gait Assistance: 1: +2 Total assist Ambulation/Gait: Patient Percentage: 70% Ambulation Distance (Feet):  40 Feet Assistive device: Rolling walker Ambulation/Gait Assistance Details: Pt reports being tired, Decreased control of R leg during swing, at times cam boot would hit RW leg, cues for safety. Gait Pattern: Step-to pattern;Antalgic Gait velocity: cues for [facility] RLE    Exercises Total Joint Exercises Heel Slides: AAROM;Right;10 reps;Supine Hip ABduction/ADduction: AAROM;Right;10 reps;Supine   PT Diagnosis: Difficulty walking;Acute pain  PT Problem List: Decreased strength;Decreased range of motion;Decreased activity tolerance;Decreased mobility;Decreased knowledge of precautions;Decreased safety awareness;Decreased knowledge of use of DME;Pain PT Treatment Interventions: DME instruction;Gait training;Functional mobility training;Therapeutic activities;Therapeutic exercise;Patient/family education   PT Goals (current goals can now be found in the care plan section) Acute Rehab PT Goals Patient Stated Goal: walk PT Goal Formulation: With patient Time For Goal Achievement: 09/25/13 Potential to Achieve Goals: Good  Visit Information  Last PT Received On: 09/18/13 Assistance Needed: +2 History of Present Illness: S/P Revision of posterior THA on 09/17/13. Pt also has healing R tibia fx with Cam boot. Has L1 ffx and uses back brace when up.    Subjective Data  Patient Stated Goal: walk   Cognition  Cognition Arousal/Alertness: Awake/alert Behavior During Therapy: WFL for tasks assessed/performed Overall Cognitive Status: Within Functional Limits for tasks assessed    Balance     End of Session PT - End of Session Equipment Utilized During Treatment: Gait belt Activity Tolerance: Patient limited by fatigue Patient left: in bed;with call bell/phone within reach Nurse Communication: Mobility status;Patient requests pain meds   GP     Rada Hay 09/18/2013, 5:10 PM

## 2013-09-18 NOTE — Evaluation (Addendum)
Physical Therapy Evaluation Patient Details Name: [name] MRN: [id number] DOB: 1946/06/13 Today's Date: 09/18/2013 Time: 4098-1191 PT Time Calculation (min): 26 min.  PT Assessment / Plan / Recommendation History of Present Illness  S/P Revision of posterior THA on 09/17/13. Pt also has healing R tibia fx with Cam boot. Has L1 ffx and uses back brace when up.  Clinical Impression  Pt tolerated ambulating with PWB on R very well. Pt plans to go to snf at DC/ Pt will benefit from PT to address problems listed.    PT Assessment  Patient needs continued PT services    Follow Up Recommendations  SNF    Does the patient have the potential to tolerate intense rehabilitation      Barriers to Discharge        Equipment Recommendations  None recommended by PT    Recommendations for Other Services     Frequency 7X/week    Precautions / Restrictions Precautions Precautions: Posterior Hip;Back Required Braces or Orthoses: Spinal Brace;Other Brace/Splint Spinal Brace: Applied in sitting position (3 point brace.) Other Brace/Splint: Cam boot for R. Restrictions Weight Bearing Restrictions: Yes RLE Weight Bearing: Partial weight bearing RLE Partial Weight Bearing Percentage or Pounds: 50   Pertinent Vitals/Pain Relates R hip hurts less than before surgery.      Mobility  Bed Mobility Bed Mobility: Supine to Sit Supine to Sit: HOB elevated;With rails;4: Min assist Details for Bed Mobility Assistance: cues for posterior hip precautions. Transfers Transfers: Sit to Stand;Stand to Sit Sit to Stand: 1: +2 Total assist Sit to Stand: Patient Percentage: 80% Stand to Sit: 4: Min assist Details for Transfer Assistance: cues for safety, use of UE's and Posterior hip precautions. Ambulation/Gait Ambulation/Gait Assistance: 1: +2 Total assist Ambulation/Gait: Patient Percentage: 80% Ambulation Distance (Feet): 30 Feet Assistive device: Rolling walker Gait velocity: cues for  PWB RLE    Exercises     PT Diagnosis: Difficulty walking;Acute pain  PT Problem List: Decreased strength;Decreased range of motion;Decreased activity tolerance;Decreased mobility;Decreased knowledge of precautions;Decreased safety awareness;Decreased knowledge of use of DME;Pain PT Treatment Interventions: DME instruction;Gait training;Functional mobility training;Therapeutic activities;Therapeutic exercise;Patient/family education     PT Goals(Current goals can be found in the care plan section) Acute Rehab PT Goals Patient Stated Goal: I want to walk but take it slow. PT Goal Formulation: With patient Time For Goal Achievement: 09/25/13 Potential to Achieve Goals: Good  Visit Information  Last PT Received On: 09/18/13 Assistance Needed: +1 History of Present Illness: S/P Revision of posterior THA on 09/17/13. Pt also has healing R tibia fx with Cam boot. Has L1 ffx and uses back brace when up.       Prior Functioning  Home Living Family/patient expects to be discharged to:: Private residence Living Arrangements: Spouse/significant other Available Help at Discharge: Skilled Nursing Facility;Family Type of Home: House Home Access: Level entry Home Layout: One level Home Equipment: Walker - 2 wheels;Tub bench;Bedside commode;Wheelchair - manual Prior Function Level of Independence: Independent with assistive device(s);Needs assistance Gait / Transfers Assistance Needed: stand pivot with RW only to WC, mobilized in [facility] Communication Communication: No difficulties    Cognition  Cognition Arousal/Alertness: Awake/alert Behavior During Therapy: WFL for tasks assessed/performed Overall Cognitive Status: Within Functional Limits for tasks assessed    Extremity/Trunk Assessment Upper Extremity Assessment Upper Extremity Assessment: Defer to OT evaluation Lower Extremity Assessment Lower Extremity Assessment: RLE deficits/detail RLE Deficits / Details: assist to slide leg to  edge of the bed. able to advance Leg  Balance    End of Session PT - End of Session Equipment Utilized During Treatment:  (R Cam boot, Back brace.) Activity Tolerance: Patient tolerated treatment well Patient left: in chair;with call bell/phone within reach  GP     Rada Hay 09/18/2013, 2:57 PM Blanchard Kelch PT [phone]

## 2013-09-18 NOTE — Progress Notes (Signed)
Utilization review completed.  

## 2013-09-18 NOTE — Progress Notes (Signed)
Clinical Social Work Department CLINICAL SOCIAL WORK PLACEMENT NOTE 09/18/2013  Patient:  [name], [name]  Account Number:  [id number] Admit date:  09/17/2013  Clinical Social Worker:  Cori Razor, LCSW  Date/time:  09/18/2013 12:50 PM  Clinical Social Work is seeking post-discharge placement for this patient at the following level of care:   SKILLED NURSING   (*CSW will update this form in Epic as items are completed)     Patient/family provided with [facility] Department of Clinical Social Work's list of facilities offering this level of care within the geographic area requested by the patient (or if unable, by the patient's family).  09/18/2013  Patient/family informed of their freedom to choose among providers that offer the needed level of care, that participate in Medicare, Medicaid or managed care program needed by the patient, have an available bed and are willing to accept the patient.    Patient/family informed of [facility]' ownership interest in [facility], as well as of the fact that they are under no obligation to receive care at this facility.  PASARR submitted to EDS on  PASARR number received from [employer] on 06/07/2012  FL2 transmitted to all facilities in geographic area requested by pt/family on  09/18/2013 FL2 transmitted to all facilities within larger geographic area on   Patient informed that his/her managed care company has contracts with or will negotiate with  certain facilities, including the following:     Patient/family informed of bed offers received:  09/18/2013 Patient chooses bed at [facility] [place] Physician recommends and patient chooses bed at    Patient to be transferred to [facility] [place] on   Patient to be transferred to facility by   The following physician request were entered in Epic:   Additional Comments:  Cori Razor LCSW [phone]

## 2013-09-18 NOTE — Op Note (Signed)
[name], [name]              ACCOUNT NO.:  [id number]  MEDICAL RECORD NO.:  [id number]  LOCATION:  1607                         FACILITY:  [facility]  PHYSICIAN:  Madlyn Frankel. Charlann Boxer, M.D.  DATE OF BIRTH:  May 19, 1946  DATE OF PROCEDURE:  09/17/2013 DATE OF DISCHARGE:                              OPERATIVE REPORT   PREOPERATIVE DIAGNOSIS:  Failed right total hip arthroplasty.  POSTOPERATIVE DIAGNOSIS:  Failed right total hip arthroplasty.  PROCEDURE:  Revision of right total hip arthroplasty.  COMPONENTS USED:  DePuy solution 15 mm large body straight 8 inch Stem that I elected to cement in to place, a 36 plus 4, 10 degree face changing liner, and a 36 plus 12 metal ball.  SURGEON:  Madlyn Frankel. Charlann Boxer, M.D.  ASSISTANT:  Lanney Gins, PA-C.  ANESTHESIA:  General.  SPECIMEN:  Swabs from the joint were taken and sent to pathology.  DRAINS:  None.  BLOOD LOSS:  Noted to be about 2500 mL.  COMPLICATIONS:  None apparent.  INDICATION FOR PROCEDURE:  [name] is 67 year old male with a complex history involving his right hip including primary total hip arthroplasty for failed open reduction and internal fixation of the right hip.  This was complicated by ongoing wrist and right leg issues. He is now over a year out from the surgery about.  He has had recent onset of pain in his right hip with weightbearing activities.  Workup was negative for infection.  Bone scan was negative.  I had a lengthy discussion regarding the potential etiologies of pain.  He had obviously shortened or subsided his femoral component from his previous surgical Components.  Clinically, I was concerned for either  loosening versus joint subluxation or instability.  After a lengthy discussion in the office, he wished at this point to proceed with revision surgery.  I thus told him we have available all would be a possible to manage either the femoral side loosening and/or joint subluxation issues.  Given this  discussion, consent was obtained for benefit of pain relief.  DESCRIPTION OF PROCEDURE:  The patient was brought to operative theater. Once adequate anesthesia, preoperative antibiotics, 2 g of Ancef plus a gram of vancomycin utilized, he was positioned into the left lateral decubitus position with the right side up.  The right lower extremity was then prepped and draped in a sterile fashion.  Time-out was performed identifying the patient, planned procedure, and extremity.  I used a portion of his old incision excising the scar.  Soft tissue planes were created down to the iliotibial band and gluteal fascia which was incised for posterior approach to the hip.  The posterior aspect of the hip was exposed with significant debridement carried out, exposing the posterior aspect of the hip joint.  There was no obvious purulence identified.  As I exposed the posterior aspect of the hip, I was able to dislocate the femur noted an obvious loosening of this 19.5 large body 10 inch bowed stem.  With the hip dislocated, the femoral component was removed very easily, and noted there to be a fibrous layer over the femoral stem and into the canal consistent with his lack of bony ingrowth and  attempted fibrous ingrowth.  Significant amount of time was spent removing this fibers to open the canal.  My preoperative plan was to take this 19.5 stem and replaced it with a larger stem that was available, we had to get ordered in as a 22.5, but as I was debriding this canal and reaming up to a 22 mm reamer, I got no purchase of bone throughout the canal itself.  Given this, I had to change my plan of thought process.  At this point, the femur was packed off and attention was directed to the acetabular component.  I further debrided and exposed the posterior aspect of the hip and removed the old trial liner.  At this point, I irrigated both the canal with a canal brush irrigator and the hip joint with 3 L  of normal saline solution.  I then let the hip joint sit with a combination of 2% chlorhexidine and 500 mL of saline.  During this timeframe, the solution that I came up with in terms of solving his femoral sided problem was this cemented solution stem and I chose this to get cement interdigitation of the stem, though I knew that the bony ingrowth potential from the cement would be minimal that this was the only possible way to provide some initial fixation to provide some stability of his hip joint.  At this point, we opened up a 15 solution stem, large stature stem.  I placed a 36 +1 neutral trial liner and then placed a cement restrictor in the depth.  Given the subsidence and loss of bony landmarks based on heterotopic bone as well as the old fracture trochanter that was removed, I utilized the position of the old stem anatomically as compared radiographically to determine the height at which I wanted to hold the stem in place.  I then prepared 3 batches of cement with vancomycin and tobramycin, and then injected the cement into the prepared canal and held the final stem into orientation at the depth and height appropriately based of the landmarks available.  I held the component about 20 degrees of anteversion.  At this point, did a trial reduction with a +8 ball.  When I did this, the leg length seem to be much more comfortable in length, matched up to the down leg.  The range of motion, however, was difficult to assess due to the size of his body and perhaps some soft tissue impingement.  I thus made a decision at this point based on the potential for impingement to use a 36 plus 4 face changing 10 degree liner and a 36 plus 12 ball.  At this point, all trial components removed after dislocating the hip including the liner, the final liner was opened and the new liner impacted with the lip portion positioned at 9 o'clock for this right hip.  The final 36 plus 12 metal ball was  impacted on the dried trunnion and the hip was reduced.  We irrigated the hip again with another 3 L normal saline solution.  Once this was done and based on the debridements over there, I reapproximated the iliotibial band and gluteal fascia using a combination of #1 Vicryl and running 0 V-Loc suture.  The remainder of wound was closed with 2-0 Vicryl and running 3-0 Monocryl.  The hip was then cleaned, dried, dressed sterilely using Dermabond and Aquacel dressing.  He was then extubated and brought to the recovery room in stable condition, tolerating the procedure well. Findings reviewed  with his wife.  He will be partial weightbearing as he is still recovering from a right leg surgery.  Posterior hip precautions will be adhered to, and I will prophylactically place him on vancomycin based on all other issues that he had been through.     Madlyn Frankel Charlann Boxer, M.D.     MDO/MEDQ  D:  09/17/2013  T:  09/18/2013  Job:  [id number]

## 2013-09-18 NOTE — Plan of Care (Signed)
Problem: Consults Goal: Diagnosis- Total Joint Replacement Outcome: Completed/Met Date Met:  09/18/13 Revision Total Hip RIGHT

## 2013-09-18 NOTE — Progress Notes (Signed)
   Subjective: 1 Day Post-Op Procedure(s) (LRB): REVISION RIGHT TOTAL HIP ARTHROPLASTY  (Right)   Patient reports pain as mild, pain controlled. No events throughout the night.   Objective:   VITALS:   Filed Vitals:   09/18/13  BP: 118/53  Pulse: 77  Temp: 98 F (36.7 C)   Resp: 18    Neurovascular intact Dorsiflexion/Plantar flexion intact Incision: dressing C/D/I No cellulitis present Compartment soft  LABS  Recent Labs  09/17/13 1654 09/18/13 0511  HGB 10.5* 9.1*  HCT 31.9* 28.4*  WBC  --  6.6  PLT  --  193     Recent Labs  09/18/13 0511  NA 133*  K 4.1  BUN 18  CREATININE 1.02  GLUCOSE 119*     Assessment/Plan: 1 Day Post-Op Procedure(s) (LRB): REVISION RIGHT TOTAL HIP ARTHROPLASTY  (Right) Foley d/c'ed Advance diet Up with therapy D/C IV fluids Discharge to SNF eventually, when ready  Expected ABLA  Treated with iron and will observe  Obese (BMI 30-39.9) Estimated body mass index is 36.65 kg/(m^2) as calculated from the following:   Height as of this encounter: 6\' 4"  (1.93 m).   Weight as of this encounter: 136.533 kg (301 lb). Patient also counseled that weight may inhibit the healing process Patient counseled that losing weight will help with future health issues  Hyponatremia Treated with IV fluids and will observe        Anastasio Auerbach. [place]   PAC  09/18/2013, 8:37 AM

## 2013-09-18 NOTE — Evaluation (Signed)
Occupational Therapy Evaluation Patient Details Name: [name] MRN: [id number] DOB: April 21, 1946 Today's Date: 09/18/2013 Time: 3086-5784 OT Time Calculation (min): 23 min  OT Assessment / Plan / Recommendation History of present illness S/P Revision of posterior THA on 09/17/13. Pt also has healing R tibia fx with Cam boot. Has L1 ffx and uses back brace when up.   Clinical Impression   Pt was admitted for the above.  He will benefit from skilled OT to reinforce thps with adls.  Pt has been partial weight bearing prior to this surgery.  Pt was at [facility] and had assistance as needed.      OT Assessment  Patient needs continued OT Services    Follow Up Recommendations  SNF    Barriers to Discharge      Equipment Recommendations   (3:1 if pt doesn't have)    Recommendations for Other Services    Frequency  Min 2X/week    Precautions / Restrictions Precautions Precautions: Posterior Hip;Back Required Braces or Orthoses: Spinal Brace;Other Brace/Splint Spinal Brace: Applied in sitting position Other Brace/Splint: Cam boot for R. Restrictions Weight Bearing Restrictions: Yes RLE Weight Bearing: Partial weight bearing RLE Partial Weight Bearing Percentage or Pounds: 50   Pertinent Vitals/Pain R hip sore; repositioned and ice reapplied    ADL  Grooming: Set up Where Assessed - Grooming: Supported sitting Upper Body Bathing: Set up Where Assessed - Upper Body Bathing: Supported sitting Lower Body Bathing: Minimal assistance (with AE, sit to stand) Where Assessed - Lower Body Bathing: Supported sit to stand Upper Body Dressing: Minimal assistance (lines) Where Assessed - Upper Body Dressing: Supported sitting Lower Body Dressing: Moderate assistance (with AE, assist for camboot, teds, shoe) Where Assessed - Lower Body Dressing: Supported sit to stand Equipment Used: Reacher;Rolling walker Transfers/Ambulation Related to ADLs: sit to stand only with min assist  from recliner.  Pt had walked prior to OTs arrival ADL Comments: Reviewed thps, and pt verbalizes understanding.  He has used all AE before.  Pt will need assistance with Camboot due to precautions.  Pt wants to concentrate on RUE strength/coordination.  Provided yellow theraputty and small objects to pick up    OT Diagnosis: Generalized weakness  OT Problem List: Decreased strength;Decreased activity tolerance;Decreased knowledge of precautions OT Treatment Interventions: Self-care/ADL training;DME and/or AE instruction;Balance training;Patient/family education   OT Goals(Current goals can be found in the care plan section) Acute Rehab OT Goals Patient Stated Goal: walk OT Goal Formulation: With patient Time For Goal Achievement: 09/25/13 Potential to Achieve Goals: Good ADL Goals Pt Will Transfer to Toilet: with supervision;ambulating;bedside commode Additional ADL Goal #1: Pt will not need any cues for thps with adls/ambulation to bathroom  Visit Information  Last OT Received On: 09/18/13 Assistance Needed: +1 History of Present Illness: S/P Revision of posterior THA on 09/17/13. Pt also has healing R tibia fx with Cam boot. Has L1 ffx and uses back brace when up.       Prior Functioning     Home Living Family/patient expects to be discharged to:: Skilled nursing facility Living Arrangements: Spouse/significant other Available Help at Discharge: Skilled Nursing Facility;Family Type of Home: House Home Access: Level entry Home Layout: One level Home Equipment: Walker - 2 wheels;Tub bench;Bedside commode;Wheelchair - manual Additional Comments: pt expects to go back to [employer] Prior Function Level of Independence: Independent with assistive device(s);Needs assistance Gait / Transfers Assistance Needed: stand pivot with RW only to WC, mobilized in [facility] Comments: was able to  bring legs onto bed and don shoes Communication Communication: No difficulties          Vision/Perception     Cognition  Cognition Arousal/Alertness: Awake/alert Behavior During Therapy: WFL for tasks assessed/performed Overall Cognitive Status: Within Functional Limits for tasks assessed    Extremity/Trunk Assessment Upper Extremity Assessment Upper Extremity Assessment: Overall WFL for tasks assessed (pt states R hand has decreased FMC/drops things)     Mobility  Transfers Sit to Stand: 4: Min assist;With upper extremity assist;With armrests;From chair/3-in-1 Sit to Stand: Patient Percentage: 80% Stand to Sit: 4: Min assist Details for Transfer Assistance: cues for thps/RLE position     Exercise     Balance     End of Session OT - End of Session Activity Tolerance: Patient tolerated treatment well Patient left: in chair;with call bell/phone within reach  GO     [name] 09/18/2013, 3:45 PM Marica Otter, OTR/L [phone] 09/18/2013

## 2013-09-19 LAB — BASIC METABOLIC PANEL
BUN: 19 mg/dL (ref 6–23)
CO2: 25 mEq/L (ref 19–32)
Calcium: 8.8 mg/dL (ref 8.4–10.5)
Chloride: 99 mEq/L (ref 96–112)
Creatinine, Ser: 0.89 mg/dL (ref 0.50–1.35)
GFR calc Af Amer: >90 mL/min (ref >90)

## 2013-09-19 LAB — CBC
HCT: 27 % — ABNORMAL LOW (ref 39.0–52.0)
Hemoglobin: 8.7 g/dL — ABNORMAL LOW (ref 13.0–17.0)
MCH: 24.6 pg — ABNORMAL LOW (ref 26.0–34.0)
MCHC: 32.2 g/dL (ref 30.0–36.0)
MCV: 76.5 fL — ABNORMAL LOW (ref 78.0–100.0)
RDW: 17.4 % — ABNORMAL HIGH (ref 11.5–15.5)
WBC: 8.5 10*3/uL (ref 4.0–10.5)

## 2013-09-19 NOTE — Progress Notes (Signed)
   Subjective: 2 Days Post-Op Procedure(s) (LRB): REVISION RIGHT TOTAL HIP ARTHROPLASTY  (Right)   Patient reports pain as moderate, but well controlled with medication. He feels that he is a little more sore today, but still optimistic.No events throughout the night.  Objective:   VITALS:   Filed Vitals:   09/19/13 0606  BP: 187/68  Pulse: 74  Temp: 97.4 F (36.3 C)  Resp: 16    Neurovascular intact Dorsiflexion/Plantar flexion intact Incision: dressing C/D/I No cellulitis present Compartment soft  LABS  Recent Labs  09/17/13 1654 09/18/13 0511 09/19/13 0509  HGB 10.5* 9.1* 8.7*  HCT 31.9* 28.4* 27.0*  WBC  --  6.6 8.5  PLT  --  193 184     Recent Labs  09/18/13 0511 09/19/13 0509  NA 133* 130*  K 4.1 4.5  BUN 18 19  CREATININE 1.02 0.89  GLUCOSE 119* 181*     Assessment/Plan: 2 Days Post-Op Procedure(s) (LRB): REVISION RIGHT TOTAL HIP ARTHROPLASTY  (Right)  Up with therapy Discharge to SNF eventually, when ready  Expected ABLA  Treated with iron and will observe   Obese (BMI 30-39.9)  Estimated body mass index is 36.65 kg/(m^2) as calculated from the following:      Height as of this encounter: 6\' 4"  (1.93 m).      Weight as of this encounter: 136.533 kg (301 lb).  Patient also counseled that weight may inhibit the healing process  Patient counseled that losing weight will help with future health issues   Hyponatremia  Treated with IV fluids and will observe        Anastasio Auerbach. Riverlyn Kizziah   PAC  09/19/2013, 9:58 AM

## 2013-09-19 NOTE — Progress Notes (Signed)
Physical Therapy Treatment Patient Details Name: [name] MRN: [id number] DOB: 03/06/1946 Today's Date: 09/19/2013 Time: 7829-5621 PT Time Calculation (min): 30 min  PT Assessment / Plan / Recommendation  History of Present Illness S/P Revision of posterior THA on 09/17/13. Pt also has healing R tibia fx with Cam boot. Has L1 ffx and uses back brace when up.   PT Comments   Continues to caution reference to limiting weight bearing. For SNF tomorrow.  Follow Up Recommendations  SNF     Does the patient have the potential to tolerate intense rehabilitation     Barriers to Discharge        Equipment Recommendations  None recommended by PT    Recommendations for Other Services    Frequency 7X/week   Progress towards PT Goals Progress towards PT goals: Progressing toward goals  Plan Current plan remains appropriate    Precautions / Restrictions Precautions Precautions: Posterior Hip;Back;Fall Required Braces or Orthoses: Spinal Brace;Other Brace/Splint Spinal Brace: Applied in sitting position Other Brace/Splint: Cam boot for R. Restrictions Weight Bearing Restrictions: Yes RLE Weight Bearing: Partial weight bearing RLE Partial Weight Bearing Percentage or Pounds: 50   Pertinent Vitals/Pain No pain   Mobility  Bed Mobility Supine to Sit: 4: Min assist Details for Bed Mobility Assistance: assist RLE onto bed, cues for precautions. Transfers Sit to Stand: 4: Min assist;With upper extremity assist;With armrests Stand to Sit: 4: Min assist;To chair/3-in-1;To bed;With upper extremity assist Details for Transfer Assistance: cues for R TH precautions./RLE position Ambulation/Gait Ambulation/Gait Assistance: 1: +2 Total assist Ambulation/Gait: Patient Percentage: 80% Ambulation Distance (Feet): 80 Feet Assistive device: Rolling walker Ambulation/Gait Assistance Details: frequent cues for PWB, pt ensures PT that he is placing less than 50. Pt weght bears on R hand  through fingers due to fx of R wrist in past. Gait Pattern: Step-through pattern Gait velocity: cues for PWB RLE General Gait Details: freqent cues for RLE as it tends to externally rotate and Cam boot  may catch on RW ,     Exercises Total Joint Exercises Hip ABduction/ADduction: AAROM;Right;10 reps;Supine Other Exercises Other Exercises: Internal rotation  to neutral.   PT Diagnosis:    PT Problem List:   PT Treatment Interventions:     PT Goals (current goals can now be found in the care plan section)    Visit Information  Last PT Received On: 09/19/13 Assistance Needed: +1 History of Present Illness: S/P Revision of posterior THA on 09/17/13. Pt also has healing R tibia fx with Cam boot. Has L1 ffx and uses back brace when up.    Subjective Data      Cognition  Cognition Arousal/Alertness: Awake/alert    Balance     End of Session PT - End of Session Equipment Utilized During Treatment: Gait belt Activity Tolerance: Patient tolerated treatment well Patient left: in chair;with call bell/phone within reach Nurse Communication: Mobility status   GP     [name] 09/19/2013, 4:20 PM

## 2013-09-19 NOTE — Care Management Note (Signed)
    Page 1 of 1   09/19/2013     6:24:18 PM   CARE MANAGEMENT NOTE 09/19/2013  Patient:  [name], [name]   Account Number:  [id number]  Date Initiated:  09/19/2013  Documentation initiated by:  Colleen Can  Subjective/Objective Assessment:   dx failed rt total hip replacemnt : revision rt total hip replacemnt     Action/Plan:   SNF rehab   Anticipated DC Date:  09/20/2013   Anticipated DC Plan:  SKILLED NURSING FACILITY  In-house referral  Clinical Social Worker      DC Planning Services  CM consult      Choice offered to / List presented to:             Status of service:  Completed, signed off Medicare Important Message given?   (If response is "NO", the following Medicare IM given date fields will be blank) Date Medicare IM given:   Date Additional Medicare IM given:    Discharge Disposition:    Per UR Regulation:    If discussed at Long Length of Stay Meetings, dates discussed:    Comments:

## 2013-09-19 NOTE — Progress Notes (Signed)
Physical Therapy Treatment Patient Details Name: [name] MRN: [id number] DOB: 1945-11-19 Today's Date: 09/19/2013 Time: 1027-1058 PT Time Calculation (min): 31 min  PT Assessment / Plan / Recommendation  History of Present Illness S/P Revision of posterior THA on 09/17/13. Pt also has healing R tibia fx with Cam boot. Has L1 ffx and uses back brace when up.   PT Comments   Pt is improving in mobility. Frequent cues for safe  PWB. May need to consider use   R platform if pt. Continues to work on increasing in distance as pt is not able to weight bear through wrist.  Follow Up Recommendations  SNF     Does the patient have the potential to tolerate intense rehabilitation     Barriers to Discharge        Equipment Recommendations  None recommended by PT    Recommendations for Other Services    Frequency 7X/week   Progress towards PT Goals Progress towards PT goals: Progressing toward goals  Plan Current plan remains appropriate    Precautions / Restrictions Precautions Precautions: Posterior Hip;Back;Fall Required Braces or Orthoses: Spinal Brace;Other Brace/Splint Spinal Brace: Applied in sitting position Other Brace/Splint: Cam boot for R. Restrictions Weight Bearing Restrictions: Yes RLE Weight Bearing: Partial weight bearing RLE Partial Weight Bearing Percentage or Pounds: 50   Pertinent Vitals/Pain HR 89, dyspnea 2-3    Mobility  Bed Mobility Supine to Sit: 4: Min assist Details for Bed Mobility Assistance: assist RLE onto bed, cues for precautions. Transfers Sit to Stand: 4: Min assist;With upper extremity assist;From bed Stand to Sit: 4: Min assist;With armrests;To chair/3-in-1 Details for Transfer Assistance: cues for R TH precautions./RLE position Ambulation/Gait Ambulation/Gait Assistance: 1: +2 Total assist Ambulation/Gait: Patient Percentage: 80% Ambulation Distance (Feet): 80 Feet Ambulation/Gait Assistance Details: frequent cues for PWB, pt  ensures PT that he is placing less than 50. Pt weght bears on R hand through fingers due to fx of R wrist in past. Gait Pattern: Step-through pattern Gait velocity: cues for PWB RLE General Gait Details: freqent cues for RLE as it tends to externally rotate and Cam boot  may catch on RW     Exercises     PT Diagnosis:    PT Problem List:   PT Treatment Interventions:     PT Goals (current goals can now be found in the care plan section)    Visit Information  Last PT Received On: 09/19/13 Assistance Needed: +2 History of Present Illness: S/P Revision of posterior THA on 09/17/13. Pt also has healing R tibia fx with Cam boot. Has L1 ffx and uses back brace when up.    Subjective Data      Cognition  Cognition Arousal/Alertness: Awake/alert    Balance     End of Session PT - End of Session Equipment Utilized During Treatment: Gait belt Activity Tolerance: Patient tolerated treatment well Patient left: in chair;with call bell/phone within reach Nurse Communication: Mobility status   GP     [name] 09/19/2013, 4:15 PM

## 2013-09-20 MED ORDER — DSS 100 MG PO CAPS: 100.0000 mg | ORAL_CAPSULE | Freq: Two times a day (BID) | ORAL | Status: DC

## 2013-09-20 MED ORDER — HYDROCODONE-ACETAMINOPHEN 7.5-325 MG PO TABS: 1.0000 | ORAL_TABLET | ORAL | Status: DC

## 2013-09-20 MED ORDER — METHOCARBAMOL 500 MG PO TABS: 500.0000 mg | ORAL_TABLET | Freq: Two times a day (BID) | ORAL | Status: DC | PRN

## 2013-09-20 MED ORDER — FERROUS SULFATE 325 (65 FE) MG PO TABS: 325.0000 mg | ORAL_TABLET | Freq: Three times a day (TID) | ORAL | Status: DC

## 2013-09-20 NOTE — Progress Notes (Signed)
Physical Therapy Treatment Patient Details Name: [name] MRN: [id number] DOB: [birth year] Today's Date: 09/20/2013 Time: 7829-5621 PT Time Calculation (min): 42 min  PT Assessment / Plan / Recommendation  History of Present Illness S/P Revision of posterior THA on 09/17/13. Pt also has healing R tibia fx with Cam boot. Has L1 ffx and uses back brace when up.   PT Comments   Pt  Reports he notified Dr. Charlann Boxer about the popping he feels when supine and performs heel slides. Per pt, pt to work on internal rotation to neutral from external rotation position, pro R leg into more neutral. Pt for SNF today.  Follow Up Recommendations  SNF     Does the patient have the potential to tolerate intense rehabilitation     Barriers to Discharge        Equipment Recommendations       Recommendations for Other Services    Frequency 7X/week   Progress towards PT Goals Progress towards PT goals: Progressing toward goals  Plan Current plan remains appropriate    Precautions / Restrictions Precautions Precautions: Posterior Hip;Back;Fall Precaution Comments: today, pt reports a popping in R hip during Heel slides. Dr Charlann Boxer aware. recommends to work on Internal rotation to neutral, prop R leg/foot into more neutral. Required Braces or Orthoses: Spinal Brace;Other Brace/Splint Spinal Brace: Applied in sitting position Other Brace/Splint: Cam boot for R. Restrictions Weight Bearing Restrictions: Yes RLE Weight Bearing: Partial weight bearing RLE Partial Weight Bearing Percentage or Pounds: 50   Pertinent Vitals/Pain No pain    Mobility  Bed Mobility Supine to Sit: 5: Supervision;HOB elevated Details for Bed Mobility Assistance: no assist for RLE out of bed. Assist RLE onto bed. Transfers Sit to Stand: 4: Min assist;With upper extremity assist;From bed Stand to Sit: 4: Min assist;To bed;With upper extremity assist Details for Transfer Assistance: cues for R TH precautions./RLE  position Ambulation/Gait Ambulation/Gait Assistance: 4: Min Environmental consultant (Feet): 60 Feet (then 80'.) Assistive device: Rolling walker Ambulation/Gait Assistance Details: frequent cues for Internal rotation during swing. R  Cam boot tends to cause R leg to externally rotate more just before swing. cues for [facility] reinforced. Gait Pattern: Step-through pattern Gait velocity: cues for PWB RLE General Gait Details: freqent cues for RLE as it tends to externally rotate and Cam boot  may catch on RW ,     Exercises     PT Diagnosis:    PT Problem List:   PT Treatment Interventions:     PT Goals (current goals can now be found in the care plan section)    Visit Information  Last PT Received On: 09/20/13 Assistance Needed: +1 History of Present Illness: S/P Revision of posterior THA on 09/17/13. Pt also has healing R tibia fx with Cam boot. Has L1 ffx and uses back brace when up.    Subjective Data      Cognition  Cognition Arousal/Alertness: Awake/alert    Balance     End of Session PT - End of Session Equipment Utilized During Treatment: Gait belt (back brace, cam boot.) Activity Tolerance: Patient tolerated treatment well Patient left: with call bell/phone within reach;in bed Nurse Communication: Mobility status   GP     Rada Hay 09/20/2013, 12:20 PM Blanchard Kelch PT [phone]

## 2013-09-20 NOTE — Progress Notes (Signed)
Clinical Social Work Department CLINICAL SOCIAL WORK PLACEMENT NOTE 09/20/2013  Patient:  [name], [name]  Account Number:  [id number] Admit date:  09/17/2013  Clinical Social Worker:  Cori Razor, LCSW  Date/time:  09/18/2013 12:50 PM  Clinical Social Work is seeking post-discharge placement for this patient at the following level of care:   SKILLED NURSING   (*CSW will update this form in Epic as items are completed)     Patient/family provided with [facility] Department of Clinical Social Work's list of facilities offering this level of care within the geographic area requested by the patient (or if unable, by the patient's family).  09/18/2013  Patient/family informed of their freedom to choose among providers that offer the needed level of care, that participate in Medicare, Medicaid or managed care program needed by the patient, have an available bed and are willing to accept the patient.    Patient/family informed of [facility]' ownership interest in [facility], as well as of the fact that they are under no obligation to receive care at this facility.  PASARR submitted to [employer] on  PASARR number received from [employer] on 06/07/2012  FL2 transmitted to all facilities in geographic area requested by pt/family on  09/18/2013 FL2 transmitted to all facilities within larger geographic area on   Patient informed that his/her managed care company has contracts with or will negotiate with  certain facilities, including the following:     Patient/family informed of bed offers received:  09/18/2013 Patient chooses bed at [facility] [place] Physician recommends and patient chooses bed at    Patient to be transferred to [facility] [place] on  09/20/2013 Patient to be transferred to facility by [facility]  The following physician request were entered in Epic:   Additional Comments: Pt is aware that insurance may not cover cost of transportation to  SNF.  Cori Razor LCSW [phone]

## 2013-09-20 NOTE — Progress Notes (Signed)
   Subjective: 3 Days Post-Op Procedure(s) (LRB): REVISION RIGHT TOTAL HIP ARTHROPLASTY  (Right)   Patient reports pain as mild, pain controlled. No events throughout the night. Concerned with a pop after he has allowed the hip to externally rotate. No pain with or after the pop, and good ROM. He is able to walk with 50% WB well and without difficulty.  Otherwise no events throughout the night.  Objective:   VITALS:   Filed Vitals:   09/20/13 0552  BP: 146/75  Pulse: 64  Temp: 97.3 F (36.3 C)  Resp: 16    Neurovascular intact Dorsiflexion/Plantar flexion intact Incision: dressing C/D/I No cellulitis present Compartment soft  LABS  Recent Labs  09/17/13 1654 09/18/13 0511 09/19/13 0509  HGB 10.5* 9.1* 8.7*  HCT 31.9* 28.4* 27.0*  WBC  --  6.6 8.5  PLT  --  193 184     Recent Labs  09/18/13 0511 09/19/13 0509  NA 133* 130*  K 4.1 4.5  BUN 18 19  CREATININE 1.02 0.89  GLUCOSE 119* 181*     Assessment/Plan: 3 Days Post-Op Procedure(s) (LRB): REVISION RIGHT TOTAL HIP ARTHROPLASTY  (Right) Pillows placed on the lateral aspect of the knee to keep the leg in a neutral postion  Discussed needing to work on strengthening the muscle to keep the leg neutral Up with therapy Discharge to SNF, [facility] Follow up in 2 weeks at [facility]. Follow up with OLIN,Drucella Karbowski D in 2 weeks.  Contact information:  [facility] [address] [place] [address] [phone]    Expected ABLA  Treated with iron and will observe   Obese (BMI 30-39.9)  Estimated body mass index is 36.65 kg/(m^2) as calculated from the following:      Height as of this encounter: 6\' 4"  (1.93 m).      Weight as of this encounter: 136.533 kg (301 lb).  Patient also counseled that weight may inhibit the healing process  Patient counseled that losing weight will help with future health issues          Anastasio Auerbach. Elenora Hawbaker    PAC  09/20/2013, 8:24 AM

## 2013-09-20 NOTE — Discharge Summary (Signed)
Physician Discharge Summary  Patient ID: [name] MRN: [id number] DOB/AGE: [birth year] 68 y.o.  Admit date: 09/17/2013 Discharge date:  09/20/2013  Procedures:  Procedure(s) (LRB): REVISION RIGHT TOTAL HIP ARTHROPLASTY  (Right)  Attending Physician:  Dr. Durene Romans   Admission Diagnoses:   Right hip pain  Discharge Diagnoses:  Principal Problem:   S/P right TH revision Active Problems:   Expected blood loss anemia   Hyponatremia  Past Medical History  Diagnosis Date  . Acid reflux   . Thyroid disease   . Low testosterone, possible hypogonadism 10/01/2012  . Lumbago   . High cholesterol   . Anxiety   . Depression   . CAD (coronary artery disease)   . Hypertension     patient states he does not have high blood pressure  . Arthritis   . Environmental allergies   . Vertebral compression fracture     L1-wears brace  . Sleep apnea     cpap machine-settings 17  . Transfusion history     s/p 12'13 hip surgery  . A-fib     hx.- sinus rhythm now    HPI: [name], 67 y.o. male, has a history of pain and functional disability in the right hip due to trauma. A In August of 2013 he fell, broke his hip and a total hip replacement was performed to fix the hip. In December of 2013 he fell and caused a periprosthetic fracture on the right hip as well and wrist fracture. Upon return to the clinic it was noted that he had almost no boney ingrowth of the prosthesis. He was worked up for infection. It was decided to revise the hip components. While waiting to do hip surgery he rebroke his ankle through a plate and screws holding a previous fracture in place. He has also been diagnosed with compression fractures of the lumbar spine. Through all of the fractures he has sustained he saw an endocrinologist that diagnosed him with Metabolic Bone Disease. He is now ready to proceed with a revision of the right total hip arthroplasty. Risks, benefits and expectations were  discussed with the patient. Risks including but not limited to the risk of anesthesia, blood clots, nerve damage, blood vessel damage, failure of the prosthesis, infection and up to and including death. Patient understand the risks, benefits and expectations and wishes to proceed with surgery.   PCP: Irena Reichmann, DO   Discharged Condition: good  Hospital Course:  Patient underwent the above stated procedure on 09/17/2013. Patient tolerated the procedure well and brought to the recovery room in good condition and subsequently to the floor.  POD #1 BP: 118/53 ; Pulse: 77 ; Temp: 98 F (36.7 C) ; Resp: 18  Pt's foley was removed. IV was changed to a saline lock. Patient reports pain as mild, pain controlled. No events throughout the night. Neurovascular intact, dorsiflexion/plantar flexion intact, incision: dressing C/D/I, no cellulitis present and compartment soft.   LABS  Basename    HGB  9.1  HCT  28.4   POD #2  BP: 187/68 ; Pulse: 74 ; Temp: 97.4 F (36.3 C) ; Resp: 16 Patient reports pain as moderate, but well controlled with medication. He feels that he is a little more sore today, but still optimistic.No events throughout the night. Neurovascular intact, dorsiflexion/plantar flexion intact, incision: dressing C/D/I, no cellulitis present and compartment soft.   LABS  Basename    HGB  8.7  HCT  27.0   POD #3  BP: 146/75 ; Pulse: 64 ; Temp: 97.3 F (36.3 C) ; Resp: 16  Patient reports pain as mild, pain controlled. No events throughout the night. Concerned with a pop after he has allowed the hip to externally rotate. No pain with or after the pop, and good ROM. He is able to walk with 50% WB well and without difficulty. Otherwise no events throughout the night.  Ready to be discharged to SNF. Neurovascular intact, dorsiflexion/plantar flexion intact, incision: dressing C/D/I, no cellulitis present and compartment soft.   LABS   No new labs  Discharge Exam: General appearance:  alert, cooperative and no distress Extremities: Homans sign is negative, no sign of DVT, no edema, redness or tenderness in the calves or thighs and no ulcers, gangrene or trophic changes  Disposition:     Home or Self Care with follow up in 2 weeks   Follow-up Information   Follow up with Shelda Pal, MD. Schedule an appointment as soon as possible for a visit in 2 weeks.   Specialty:  Orthopedic Surgery   Contact information:   [address] [place] [address] [phone]       Discharge Orders   Future Orders Complete By Expires   Call MD / Call 911  As directed    Comments:     If you experience chest pain or shortness of breath, CALL 911 and be transported to the hospital emergency room.  If you develope a fever above 101 F, pus (white drainage) or increased drainage or redness at the wound, or calf pain, call your surgeon's office.   Change dressing  As directed    Comments:     Maintain surgical dressing for 10-14 days, then replace with 4x4 guaze and tape. Keep the area dry and clean.   Constipation Prevention  As directed    Comments:     Drink plenty of fluids.  Prune juice may be helpful.  You may use a stool softener, such as Colace (over the counter) 100 mg twice a day.  Use MiraLax (over the counter) for constipation as needed.   Diet - low sodium heart healthy  As directed    Discharge instructions  As directed    Comments:     Maintain surgical dressing for 10-14 days or until return to the clinic. Keep the area dry and clean until follow up. Place place pillows on the outside of the right knee to keep the right leg in neutral position.  Follow up in 2 weeks at [facility]. Call with any questions or concerns.   Driving restrictions  As directed    Comments:     No driving for 4 weeks   Partial weight bearing  As directed    Questions:     % Body Weight:  50   Laterality:  right   Extremity:  Lower   TED hose  As directed     Comments:     Use stockings (TED hose) for 2 weeks on both leg(s).  You may remove them at night for sleeping.        Medication List    STOP taking these medications       doxycycline 100 MG capsule  Commonly known as:  VIBRAMYCIN     oxyCODONE-acetaminophen 5-325 MG per tablet  Commonly known as:  PERCOCET/ROXICET      TAKE these medications       amiodarone 100 MG tablet  Commonly known as:  PACERONE  Take 100 mg by mouth daily.     atorvastatin 80 MG tablet  Commonly known as:  LIPITOR  Take 80 mg by mouth every evening.     BLINK TEARS 0.25 % Soln  Generic drug:  Polyethylene Glycol 400  Apply 1 drop to eye 2 (two) times daily as needed (dry eyes).     clonazePAM 1 MG tablet  Commonly known as:  KLONOPIN  Take 1 mg by mouth 3 (three) times daily.     diltiazem 240 MG 24 hr capsule  Commonly known as:  CARDIZEM CD  Take 240 mg by mouth every morning.     DSS 100 MG Caps  Take 100 mg by mouth 2 (two) times daily.     DULoxetine 60 MG capsule  Commonly known as:  CYMBALTA  Take 60 mg by mouth 2 (two) times daily.     ENBREL SURECLICK 50 MG/ML injection  Generic drug:  etanercept  50 mg every 14 (fourteen) days. Last  Dose 09-07-13     ferrous sulfate 325 (65 FE) MG tablet  Take 1 tablet (325 mg total) by mouth 3 (three) times daily after meals.     HYDROcodone-acetaminophen 7.5-325 MG per tablet  Commonly known as:  NORCO  Take 1-2 tablets by mouth every 4 (four) hours.     levothyroxine 125 MCG tablet  Commonly known as:  SYNTHROID, LEVOTHROID  Take 250 mcg by mouth daily before breakfast.     loratadine 10 MG tablet  Commonly known as:  CLARITIN  Take 10 mg by mouth daily.     methocarbamol 500 MG tablet  Commonly known as:  ROBAXIN  Take 1 tablet (500 mg total) by mouth every 12 (twelve) hours as needed for muscle spasms.     multivitamin with minerals Tabs tablet  Take 1 tablet by mouth daily.     nitroGLYCERIN 0.4 MG SL tablet  Commonly  known as:  NITROSTAT  Place 0.4 mg under the tongue every 5 (five) minutes as needed for chest pain. x3 doses as needed for chest pain     omeprazole 20 MG capsule  Commonly known as:  PRILOSEC  Take 20 mg by mouth daily.     oyster calcium 500 MG Tabs tablet  Take 500 mg of elemental calcium by mouth 2 (two) times daily.     polyethylene glycol packet  Commonly known as:  MIRALAX / GLYCOLAX  Take 17 g by mouth daily as needed. Constipation.     PROBIOTIC PO  Take 1 tablet by mouth every morning.     ranolazine 500 MG 12 hr tablet  Commonly known as:  RANEXA  Take 500 mg by mouth 2 (two) times daily.     tamsulosin 0.4 MG Caps capsule  Commonly known as:  FLOMAX  Take 0.4 mg by mouth every evening.     testosterone cypionate 200 MG/ML injection  Commonly known as:  DEPOTESTOTERONE CYPIONATE  Inject 200 mg into the muscle every 14 (fourteen) days. Last dose 09-07-13     vitamin C 1000 MG tablet  Take 1,000 mg by mouth daily.     Vitamin D3 5000 UNITS Caps  Take 5,000 Units by mouth daily.     XARELTO 20 MG Tabs tablet  Generic drug:  Rivaroxaban  Take 20 mg by mouth daily with supper.         Signed: Anastasio Auerbach. Mikenna Bunkley   PAC  09/20/2013, 8:56 AM

## 2013-09-21 ENCOUNTER — Other Ambulatory Visit: Payer: Self-pay | Admitting: *Deleted

## 2013-09-21 ENCOUNTER — Encounter: Payer: Self-pay | Admitting: Internal Medicine

## 2013-09-21 ENCOUNTER — Non-Acute Institutional Stay (SKILLED_NURSING_FACILITY): Payer: Medicare Other | Admitting: Internal Medicine

## 2013-09-21 DIAGNOSIS — I4891 Unspecified atrial fibrillation: Secondary | ICD-10-CM

## 2013-09-21 DIAGNOSIS — F329 Major depressive disorder, single episode, unspecified: Secondary | ICD-10-CM

## 2013-09-21 DIAGNOSIS — I2510 Atherosclerotic heart disease of native coronary artery without angina pectoris: Secondary | ICD-10-CM

## 2013-09-21 DIAGNOSIS — M810 Age-related osteoporosis without current pathological fracture: Secondary | ICD-10-CM

## 2013-09-21 DIAGNOSIS — M1611 Unilateral primary osteoarthritis, right hip: Secondary | ICD-10-CM

## 2013-09-21 DIAGNOSIS — M169 Osteoarthritis of hip, unspecified: Secondary | ICD-10-CM

## 2013-09-21 DIAGNOSIS — D649 Anemia, unspecified: Secondary | ICD-10-CM

## 2013-09-21 DIAGNOSIS — E039 Hypothyroidism, unspecified: Secondary | ICD-10-CM

## 2013-09-21 LAB — BODY FLUID CULTURE: Culture: NO GROWTH

## 2013-09-21 MED ORDER — CLONAZEPAM 1 MG PO TABS: ORAL_TABLET | ORAL | Status: DC

## 2013-09-21 MED ORDER — HYDROCODONE-ACETAMINOPHEN 7.5-325 MG PO TABS: ORAL_TABLET | ORAL | Status: DC

## 2013-09-21 MED ORDER — TESTOSTERONE CYPIONATE 200 MG/ML IM SOLN: 200.0000 mg | INTRAMUSCULAR | Status: DC

## 2013-09-21 NOTE — Progress Notes (Signed)
Patient ID: [name], male   DOB: Feb 10, 1946, 67 y.o.   MRN: [id number]     [facility] living [place]    PCP: Irena Reichmann, DO  Code Status: full code  No Known Allergies  Chief Complaint: new admit  HPI:  67 y/o male patient is here for STR after hospital admission from 09/17/13- 09/20/13 for revision of right total hip arthroplasty. He was here undergoing rehab in past after repair on his right tibial fracture.  Review of Systems:  Constitutional: Negative for fever, chills and weight loss.  Eyes: Negative for blurred vision.  Respiratory: Negative for cough and shortness of breath.   Cardiovascular: Negative for chest pain.  Gastrointestinal: Negative for heartburn, nausea, vomiting, abdominal pain, diarrhea and constipation.  Genitourinary: Negative for dysuria.  Musculoskeletal: Negative for falls.  Skin: Negative for rash.  Neurological: Negative for dizziness, weakness and headaches.  Psychiatric/Behavioral: Negative for depression.    Past Medical History  Diagnosis Date  . Acid reflux   . Thyroid disease   . Low testosterone, possible hypogonadism 10/01/2012  . Lumbago   . High cholesterol   . Anxiety   . Depression   . CAD (coronary artery disease)   . Hypertension     patient states he does not have high blood pressure  . Arthritis   . Environmental allergies   . Vertebral compression fracture     L1-wears brace  . Sleep apnea     cpap machine-settings 17  . Transfusion history     s/p 12'13 hip surgery  . A-fib     hx.- sinus rhythm now   Past Surgical History  Procedure Laterality Date  . Knee surgery Bilateral 2012    Total knee replacements  . Appendectomy    . Right femur surgery  05/2012  . Orif wrist fracture  10/02/2012    Procedure: OPEN REDUCTION INTERNAL FIXATION (ORIF) WRIST FRACTURE;  Surgeon: Dominica Severin, MD;  Location: [facility];  Service: Orthopedics;  Laterality: Right;  WITH   ANTIBIOTIC  CEMENT  . Total hip revision   10/05/2012    Procedure: TOTAL HIP REVISION;  Surgeon: Shelda Pal, MD;  Location: [facility];  Service: Orthopedics;  Laterality: Right;  RIGHT TOTAL HIP REVISION  . Hardware removal  10/05/2012    Procedure: HARDWARE REMOVAL;  Surgeon: Shelda Pal, MD;  Location: [facility];  Service: Orthopedics;  Laterality: Right;  REMOVING  STRYKER  GAMMA NAIL  . Wrist fracture surgery  05/2012  . Orif tibia fracture Right 02/06/2013    Procedure: OPEN REDUCTION INTERNAL FIXATION (ORIF) TIBIA FRACTURE WITH IM ROD FIBULA;  Surgeon: Budd Palmer, MD;  Location: [facility];  Service: Orthopedics;  Laterality: Right;  . Cardiac catheterization  2013  . Orif tibia fracture Right 07/03/2013    Procedure: RIGHT TIBIA NON UNION REPAIR ;  Surgeon: Budd Palmer, MD;  Location: [facility] OR;  Service: Orthopedics;  Laterality: Right;  . Hardware removal Right 07/03/2013    Procedure: HARDWARE REMOVAL RIGHT TIBIA ;  Surgeon: Budd Palmer, MD;  Location: [facility] OR;  Service: Orthopedics;  Laterality: Right;  . Joint replacement      hip-right x2  . Tonsillectomy    . Total hip revision Right 09/17/2013    Procedure: REVISION RIGHT TOTAL HIP ARTHROPLASTY ;  Surgeon: Shelda Pal, MD;  Location: [facility];  Service: Orthopedics;  Laterality: Right;   Social History:   reports that he quit smoking about 15 months ago.  He has never used smokeless tobacco. He reports that he drinks alcohol. He reports that he does not use illicit drugs.  Family History  Problem Relation Age of Onset  . CAD Father 70  . Arthritis Mother     Medications: Patient's Medications  New Prescriptions   No medications on file  Previous Medications   AMIODARONE (PACERONE) 100 MG TABLET    Take 100 mg by mouth daily.   ASCORBIC ACID (VITAMIN C) 1000 MG TABLET    Take 1,000 mg by mouth daily.   ATORVASTATIN (LIPITOR) 80 MG TABLET    Take 80 mg by mouth every evening.    CHOLECALCIFEROL (VITAMIN D3) 5000 UNITS CAPS    Take 5,000 Units by mouth daily.    CLONAZEPAM (KLONOPIN) 1 MG TABLET    Take one tablet by mouth three times daily   DILTIAZEM (CARDIZEM CD) 240 MG 24 HR CAPSULE    Take 240 mg by mouth every morning.    DOCUSATE SODIUM 100 MG CAPS    Take 100 mg by mouth 2 (two) times daily.   DULOXETINE (CYMBALTA) 60 MG CAPSULE    Take 60 mg by mouth 2 (two) times daily.   ENBREL SURECLICK 50 MG/ML INJECTION    50 mg every 14 (fourteen) days. Last  Dose 09-07-13   FERROUS SULFATE 325 (65 FE) MG TABLET    Take 1 tablet (325 mg total) by mouth 3 (three) times daily after meals.   HYDROCODONE-ACETAMINOPHEN (NORCO) 7.5-325 MG PER TABLET    Take one tablet by mouth every 4 hours as needed for pain level 1-5; Take two tablets by mouth every 4 hours as needed for pain level 6-10   LEVOTHYROXINE (SYNTHROID, LEVOTHROID) 125 MCG TABLET    Take 250 mcg by mouth daily before breakfast.   LORATADINE (CLARITIN) 10 MG TABLET    Take 10 mg by mouth daily.   METHOCARBAMOL (ROBAXIN) 500 MG TABLET    Take 1 tablet (500 mg total) by mouth every 12 (twelve) hours as needed for muscle spasms.   MULTIPLE VITAMIN (MULTIVITAMIN WITH MINERALS) TABS TABLET    Take 1 tablet by mouth daily.   NITROGLYCERIN (NITROSTAT) 0.4 MG SL TABLET    Place 0.4 mg under the tongue every 5 (five) minutes as needed for chest pain. x3 doses as needed for chest pain   OMEPRAZOLE (PRILOSEC) 20 MG CAPSULE    Take 20 mg by mouth daily.   OYSTER SHELL (OYSTER CALCIUM) 500 MG TABS TABLET    Take 500 mg of elemental calcium by mouth 2 (two) times daily.   POLYETHYLENE GLYCOL (MIRALAX / GLYCOLAX) PACKET    Take 17 g by mouth daily as needed. Constipation.   POLYETHYLENE GLYCOL 400 (BLINK TEARS) 0.25 % SOLN    Apply 1 drop to eye 2 (two) times daily as needed (dry eyes).   PROBIOTIC PRODUCT (PROBIOTIC PO)    Take 1 tablet by mouth every morning.    RANOLAZINE (RANEXA) 500 MG 12 HR TABLET    Take 500 mg by mouth 2 (two) times daily.   RIVAROXABAN (XARELTO) 20 MG TABS    Take 20 mg by mouth daily with  supper.   TAMSULOSIN HCL (FLOMAX) 0.4 MG CAPS    Take 0.4 mg by mouth every evening.    TESTOSTERONE CYPIONATE (DEPOTESTOTERONE CYPIONATE) 200 MG/ML INJECTION    Inject 1 mL (200 mg total) into the muscle every 14 (fourteen) days.  Modified Medications   No medications on file  Discontinued Medications   No medications on file     Physical Exam: Reviewed vital signs and nursing notes  General- adult patient in NAD HEENT- PERRLA, EOMI, MMM, no JVD, no lymphadeopathy cvs- irregular heart rate, no murmurs/ rubs/ gallop respiratory- CTAB, no wheeze or rhonchi or crackles abdomen- bowel sound present, soft, non tender, no guarding or rigidity Extremities- able to move all 4, right hip movement limited, wheelchair bound Skin- warm and dry, incision site clean, no drainage Neurology- no focal deficit Psychiatry- alert and oriented to person,place and time, normal mood and affect   Labs reviewed: Basic Metabolic Panel:  Recent Labs  40/98/11 0940 09/18/13 0511 09/19/13 0509  NA 132* 133* 130*  K 4.3 4.1 4.5  CL 97 101 99  CO2 26 25 25   GLUCOSE 102* 119* 181*  BUN 18 18 19   CREATININE 0.90 1.02 0.89  CALCIUM 8.9 7.6* 8.8   Liver Function Tests:  Recent Labs  02/07/13 0610 02/08/13 0431 02/09/13 0530  AST 12 11 12   ALT 12 10 11   ALKPHOS 154* 147* 145*  BILITOT 0.4 0.4 0.4  PROT 5.6* 5.7* 5.8*  ALBUMIN 2.5* 2.5* 2.5*   No results found for this basename: LIPASE, AMYLASE,  in the last 8760 hours No results found for this basename: AMMONIA,  in the last 8760 hours CBC:  Recent Labs  09/28/12 2340  07/03/13 0720  09/12/13 0940 09/17/13 1654 09/18/13 0511 09/19/13 0509  WBC 9.3  < > 5.6  < > 5.8  --  6.6 8.5  NEUTROABS 5.9  --  3.0  --   --   --   --   --   HGB 10.8*  < > 12.0*  < > 12.6* 10.5* 9.1* 8.7*  HCT 33.2*  < > 38.5*  < > 40.3 31.9* 28.4* 27.0*  MCV 82.4  < > 76.4*  < > 75.9*  --  76.1* 76.5*  PLT 277  < > 268  < > 287  --  193 184  < > = values in  this interval not displayed. Cardiac Enzymes:  Recent Labs  02/21/13 2146 02/22/13 0455 02/22/13 1009  TROPONINI <0.30 <0.30 <0.30   BNP: No components found with this basename: POCBNP,  CBG:  Recent Labs  02/07/13 0643 02/07/13 1123 02/07/13 1614  GLUCAP 141* 103* 122*    Assessment/Plan  Right hip OA- underwent right hip arthroplasty and tolerated the procedure well. Continue PT and OT . Continue calcium supplement and current pain regimen. Continue prn methocarbamol. Fall precautions  afib- rate controlled. Continue amiodarone 100 mg daily with diltiazem 240 mg daily for rate control.  continue xarelto for stroke prophylaxis.   Hyperlipidemia- continue lipitor 80 mg daily  Osteoporosis- continue forteo and calcium supplement with vitamin d  Anemia- continue iron supplement, check cbc  Anxiety- will continue the klonopin  Depression- continue cymbalta 60 mg daily with klonopin for his nerves  hypothyrodism- continue levothyroxine 125 mcg daily  CAD- remains chest pain free. Continue ranexa   BPH- continue flomax for now   Family/ staff Communication: reviewed care plan with patient and nursing supervisor   Goals of care: to return home after STR   Labs/tests ordered- cbc, cmp

## 2013-09-22 LAB — ANAEROBIC CULTURE

## 2013-09-25 ENCOUNTER — Other Ambulatory Visit: Payer: Self-pay | Admitting: *Deleted

## 2013-09-25 MED ORDER — CLONAZEPAM 1 MG PO TABS: ORAL_TABLET | ORAL | Status: DC

## 2013-10-15 ENCOUNTER — Encounter (HOSPITAL_COMMUNITY): Payer: Medicare Other | Admitting: Anesthesiology

## 2013-10-15 ENCOUNTER — Encounter (HOSPITAL_COMMUNITY): Payer: Self-pay | Admitting: General Practice

## 2013-10-15 ENCOUNTER — Ambulatory Visit (HOSPITAL_COMMUNITY): Payer: Medicare Other

## 2013-10-15 ENCOUNTER — Ambulatory Visit (HOSPITAL_COMMUNITY): Payer: Medicare Other | Admitting: Anesthesiology

## 2013-10-15 ENCOUNTER — Ambulatory Visit (HOSPITAL_COMMUNITY)
Admission: RE | Admit: 2013-10-15 | Discharge: 2013-10-15 | Disposition: A | Discharge status: Home / Self Care | Payer: Medicare Other | Source: Ambulatory Visit | Attending: Orthopedic Surgery | Admitting: Orthopedic Surgery

## 2013-10-15 ENCOUNTER — Encounter (HOSPITAL_COMMUNITY)
Admission: RE | Disposition: A | Discharge status: Home / Self Care | Payer: Self-pay | Source: Ambulatory Visit | Attending: Orthopedic Surgery

## 2013-10-15 DIAGNOSIS — Z96649 Presence of unspecified artificial hip joint: Secondary | ICD-10-CM | POA: Insufficient documentation

## 2013-10-15 DIAGNOSIS — W010XXA Fall on same level from slipping, tripping and stumbling without subsequent striking against object, initial encounter: Secondary | ICD-10-CM | POA: Insufficient documentation

## 2013-10-15 DIAGNOSIS — I2510 Atherosclerotic heart disease of native coronary artery without angina pectoris: Secondary | ICD-10-CM | POA: Insufficient documentation

## 2013-10-15 DIAGNOSIS — Z87891 Personal history of nicotine dependence: Secondary | ICD-10-CM | POA: Insufficient documentation

## 2013-10-15 DIAGNOSIS — Z79899 Other long term (current) drug therapy: Secondary | ICD-10-CM | POA: Insufficient documentation

## 2013-10-15 DIAGNOSIS — T84029A Dislocation of unspecified internal joint prosthesis, initial encounter: Secondary | ICD-10-CM | POA: Insufficient documentation

## 2013-10-15 DIAGNOSIS — Z96659 Presence of unspecified artificial knee joint: Secondary | ICD-10-CM | POA: Insufficient documentation

## 2013-10-15 DIAGNOSIS — E039 Hypothyroidism, unspecified: Secondary | ICD-10-CM | POA: Insufficient documentation

## 2013-10-15 DIAGNOSIS — I10 Essential (primary) hypertension: Secondary | ICD-10-CM | POA: Insufficient documentation

## 2013-10-15 DIAGNOSIS — I4891 Unspecified atrial fibrillation: Secondary | ICD-10-CM | POA: Insufficient documentation

## 2013-10-15 DIAGNOSIS — G4730 Sleep apnea, unspecified: Secondary | ICD-10-CM | POA: Insufficient documentation

## 2013-10-15 HISTORY — PX: HIP CLOSED REDUCTION: SHX983

## 2013-10-15 LAB — CBC
HCT: 31.5 % — ABNORMAL LOW (ref 39.0–52.0)
HEMOGLOBIN: 10 g/dL — AB (ref 13.0–17.0)
MCH: 25.1 pg — ABNORMAL LOW (ref 26.0–34.0)
MCHC: 31.7 g/dL (ref 30.0–36.0)
MCV: 78.9 fL (ref 78.0–100.0)
Platelets: 303 10*3/uL (ref 150–400)
RBC: 3.99 MIL/uL — ABNORMAL LOW (ref 4.22–5.81)
RDW: 19.3 % — ABNORMAL HIGH (ref 11.5–15.5)
WBC: 11.7 10*3/uL — AB (ref 4.0–10.5)

## 2013-10-15 LAB — BASIC METABOLIC PANEL
BUN: 25 mg/dL — ABNORMAL HIGH (ref 6–23)
CO2: 23 mEq/L (ref 19–32)
Calcium: 8.8 mg/dL (ref 8.4–10.5)
Chloride: 94 mEq/L — ABNORMAL LOW (ref 96–112)
Creatinine, Ser: 1 mg/dL (ref 0.50–1.35)
GFR calc Af Amer: 87 mL/min — ABNORMAL LOW (ref >90)
GFR, EST NON AFRICAN AMERICAN: 75 mL/min — AB (ref >90)
GLUCOSE: 118 mg/dL — AB (ref 70–99)
POTASSIUM: 4.4 meq/L (ref 3.7–5.3)
SODIUM: 131 meq/L — AB (ref 137–147)

## 2013-10-15 SURGERY — CLOSED MANIPULATION, JOINT, HIP
Anesthesia: General | Site: Hip | Laterality: Right

## 2013-10-15 MED ORDER — PROPOFOL 10 MG/ML IV BOLUS
INTRAVENOUS | Status: AC
  Filled 2013-10-15: qty 20

## 2013-10-15 MED ORDER — LIDOCAINE HCL (CARDIAC) 20 MG/ML IV SOLN
INTRAVENOUS | Status: AC
  Filled 2013-10-15: qty 5

## 2013-10-15 MED ORDER — HYDROMORPHONE HCL PF 1 MG/ML IJ SOLN: 0.2500 mg | INTRAMUSCULAR | Status: DC | PRN

## 2013-10-15 MED ORDER — SODIUM CHLORIDE 0.9 % IV SOLN: INTRAVENOUS | Status: DC

## 2013-10-15 MED ORDER — OXYCODONE HCL 5 MG PO TABS
5.0000 mg | ORAL_TABLET | Freq: Once | ORAL | Status: AC | PRN
  Administered 2013-10-15: 5 mg via ORAL

## 2013-10-15 MED ORDER — OXYCODONE HCL 5 MG PO TABS
ORAL_TABLET | ORAL | Status: AC
  Filled 2013-10-15: qty 1

## 2013-10-15 MED ORDER — MEPERIDINE HCL 50 MG/ML IJ SOLN: 6.2500 mg | INTRAMUSCULAR | Status: DC | PRN

## 2013-10-15 MED ORDER — LIDOCAINE HCL (CARDIAC) 20 MG/ML IV SOLN
INTRAVENOUS | Status: DC | PRN
  Administered 2013-10-15: 60 mg via INTRAVENOUS

## 2013-10-15 MED ORDER — OXYCODONE HCL 5 MG/5ML PO SOLN
5.0000 mg | Freq: Once | ORAL | Status: AC | PRN
  Filled 2013-10-15: qty 5

## 2013-10-15 MED ORDER — PROMETHAZINE HCL 25 MG/ML IJ SOLN: 6.2500 mg | INTRAMUSCULAR | Status: DC | PRN

## 2013-10-15 MED ORDER — PROPOFOL 10 MG/ML IV BOLUS
INTRAVENOUS | Status: DC | PRN
  Administered 2013-10-15: 200 mg via INTRAVENOUS

## 2013-10-15 MED ORDER — SUCCINYLCHOLINE CHLORIDE 20 MG/ML IJ SOLN
INTRAMUSCULAR | Status: DC | PRN
  Administered 2013-10-15: 60 mg via INTRAVENOUS

## 2013-10-15 MED ORDER — FENTANYL CITRATE 0.05 MG/ML IJ SOLN
INTRAMUSCULAR | Status: DC | PRN
  Administered 2013-10-15: 50 ug via INTRAVENOUS

## 2013-10-15 MED ORDER — FENTANYL CITRATE 0.05 MG/ML IJ SOLN
INTRAMUSCULAR | Status: AC
  Filled 2013-10-15: qty 2

## 2013-10-15 MED ORDER — LACTATED RINGERS IV SOLN
INTRAVENOUS | Status: DC | PRN
  Administered 2013-10-15: 18:00:00 via INTRAVENOUS

## 2013-10-15 SURGICAL SUPPLY — 18 items
BANDAGE ADH SHEER 1  50/CT (GAUZE/BANDAGES/DRESSINGS) IMPLANT
DURAPREP 26ML APPLICATOR (WOUND CARE) ×1 IMPLANT
GLOVE BIOGEL PI IND STRL 7.5 (GLOVE) ×1 IMPLANT
GLOVE BIOGEL PI IND STRL 8 (GLOVE) ×1 IMPLANT
GLOVE BIOGEL PI INDICATOR 7.5 (GLOVE)
GLOVE BIOGEL PI INDICATOR 8 (GLOVE)
GLOVE ECLIPSE 8.0 STRL XLNG CF (GLOVE) IMPLANT
GLOVE ORTHO TXT STRL SZ7.5 (GLOVE) ×2 IMPLANT
GLOVE SURG ORTHO 8.0 STRL STRW (GLOVE) ×1 IMPLANT
GOWN SPEC L3 XXLG W/TWL (GOWN DISPOSABLE) ×2 IMPLANT
GOWN STRL REUS W/TWL LRG LVL3 (GOWN DISPOSABLE) ×1 IMPLANT
MANIFOLD NEPTUNE II (INSTRUMENTS) ×1 IMPLANT
NDL SAFETY ECLIPSE 18X1.5 (NEEDLE) IMPLANT
NEEDLE HYPO 18GX1.5 SHARP (NEEDLE)
POSITIONER SURGICAL ARM (MISCELLANEOUS) ×1 IMPLANT
SPONGE GAUZE 4X4 12PLY (GAUZE/BANDAGES/DRESSINGS) IMPLANT
SYR CONTROL 10ML LL (SYRINGE) IMPLANT
TOWEL OR 17X26 10 PK STRL BLUE (TOWEL DISPOSABLE) ×2 IMPLANT

## 2013-10-15 NOTE — Brief Op Note (Signed)
10/15/2013  7:40 PM  PATIENT:  [name]  68 y.o. male  PRE-OPERATIVE DIAGNOSIS:  dislocated right total hip replacement after revision  POST-OPERATIVE DIAGNOSIS:  dislocated right total hip replacement after revision  PROCEDURE:  Procedure(s): CLOSED MANIPULATION HIP (Right), closed reduction of the right hip  SURGEON:  Surgeon(s) and Role:    * [name], MD - Primary  PHYSICIAN ASSISTANT: None  ASSISTANTS: Surgical team  ANESTHESIA:   general  EBL:     BLOOD ADMINISTERED:none  DRAINS: none   LOCAL MEDICATIONS USED:  NONE  SPECIMEN:  No Specimen  DISPOSITION OF SPECIMEN:  N/A  COUNTS:  NO not required closed case, no instruments  TOURNIQUET:  * No tourniquets in log *  DICTATION: .Other Dictation: Dictation Number [id number]  PLAN OF CARE: Discharge to home after PACU  PATIENT DISPOSITION:  PACU - hemodynamically stable.   Delay start of Pharmacological VTE agent (>24hrs) due to surgical blood loss or risk of bleeding: not applicable

## 2013-10-15 NOTE — Anesthesia Preprocedure Evaluation (Addendum)
Anesthesia Evaluation  Patient identified by MRN, date of birth, ID band Patient awake    Reviewed: Allergy & Precautions, H&P , NPO status , Patient's Chart, lab work & pertinent test results  Airway Mallampati: II TM Distance: >3 FB Neck ROM: full    Dental  (+) Teeth Intact   Pulmonary sleep apnea , former smoker,          Cardiovascular hypertension, Pt. on medications + CAD + dysrhythmias Atrial Fibrillation     Neuro/Psych PSYCHIATRIC DISORDERS Anxiety Depression negative neurological ROS     GI/Hepatic Neg liver ROS, GERD-  Medicated,  Endo/Other  Hypothyroidism obese  Renal/GU negative Renal ROS     Musculoskeletal negative musculoskeletal ROS (+)   Abdominal (+) + obese,   Peds  Hematology  (+) Blood dyscrasia, anemia ,   Anesthesia Other Findings   Reproductive/Obstetrics negative OB ROS                          Anesthesia Physical  Anesthesia Plan  ASA: III  Anesthesia Plan: General   Post-op Pain Management:    Induction: Intravenous  Airway Management Planned: LMA and Mask  Additional Equipment:   Intra-op Plan:   Post-operative Plan: Extubation in OR  Informed Consent: I have reviewed the patients History and Physical, chart, labs and discussed the procedure including the risks, benefits and alternatives for the proposed anesthesia with the patient or authorized representative who has indicated his/her understanding and acceptance.   Dental advisory given  Plan Discussed with: CRNA  Anesthesia Plan Comments:         Anesthesia Quick Evaluation

## 2013-10-15 NOTE — Anesthesia Postprocedure Evaluation (Signed)
Anesthesia Post Note  Patient: [name]  Procedure(s) Performed: Procedure(s) (LRB): CLOSED MANIPULATION HIP (Right)  Anesthesia type: General  Patient location: PACU  Post pain: Pain level controlled  Post assessment: Post-op Vital signs reviewed  Last Vitals: BP 167/56  Pulse 82  Temp(Src) 37.4 C (Oral)  Resp 25  Ht 6\' 4"  (1.93 m)  Wt 307 lb (139.254 kg)  BMI 37.38 kg/m2  SpO2 98%  Post vital signs: Reviewed  Level of consciousness: sedated  Complications: No apparent anesthesia complications

## 2013-10-15 NOTE — Preoperative (Signed)
Beta Blockers   Reason not to administer Beta Blockers:Not Applicable 

## 2013-10-15 NOTE — Transfer of Care (Signed)
Immediate Anesthesia Transfer of Care Note  Patient: [name]  Procedure(s) Performed: Procedure(s): CLOSED MANIPULATION HIP (Right)  Patient Location: PACU  Anesthesia Type:General  Level of Consciousness: awake, alert  and oriented  Airway & Oxygen Therapy: Patient Spontanous Breathing and Patient connected to face mask oxygen  Post-op Assessment: Report given to PACU RN and Post -op Vital signs reviewed and stable  Post vital signs: Reviewed and stable  Complications: No apparent anesthesia complications

## 2013-10-15 NOTE — Discharge Instructions (Signed)
Activity as tolerable  Wait to hear from office about scheduling revision surgery

## 2013-10-15 NOTE — H&P (Signed)
[name] is an 68 y.o. male.    Chief Complaint:  Dislocated right hip revision   HPI: Pt is a 68 y.o. male complaining of right hip pain.  [name] is status post revision right THR from which he had been recovering well from until 2 days ago when he stumbled.  After that he notice right hip pain.  He came to the office today where x-rays revealed dislocation of the right hip.  PCP:  Janie Morning, DO  D/C Plans: To be determined following appropriate treatment plan  PMH: Past Medical History  Diagnosis Date  . Acid reflux   . Thyroid disease   . Low testosterone, possible hypogonadism 10/01/2012  . Lumbago   . High cholesterol   . Anxiety   . Depression   . CAD (coronary artery disease)   . Hypertension     patient states he does not have high blood pressure  . Arthritis   . Environmental allergies   . Vertebral compression fracture     L1-wears brace  . Sleep apnea     cpap machine-settings 17  . Transfusion history     s/p 12'13 hip surgery  . A-fib     hx.- sinus rhythm now    PSH: Past Surgical History  Procedure Laterality Date  . Knee surgery Bilateral 2012    Total knee replacements  . Appendectomy    . Right femur surgery  05/2012  . Orif wrist fracture  10/02/2012    Procedure: OPEN REDUCTION INTERNAL FIXATION (ORIF) WRIST FRACTURE;  Surgeon: Roseanne Kaufman, MD;  Location: [facility];  Service: Orthopedics;  Laterality: Right;  WITH   ANTIBIOTIC  CEMENT  . Total hip revision  10/05/2012    Procedure: TOTAL HIP REVISION;  Surgeon: [name], MD;  Location: [facility];  Service: Orthopedics;  Laterality: Right;  RIGHT TOTAL HIP REVISION  . Hardware removal  10/05/2012    Procedure: HARDWARE REMOVAL;  Surgeon: [name], MD;  Location: [facility];  Service: Orthopedics;  Laterality: Right;  REMOVING  STRYKER  GAMMA NAIL  . Wrist fracture surgery  05/2012  . Orif tibia fracture Right 02/06/2013    Procedure: OPEN REDUCTION INTERNAL FIXATION (ORIF) TIBIA  FRACTURE WITH IM ROD FIBULA;  Surgeon: Rozanna Box, MD;  Location: [place];  Service: Orthopedics;  Laterality: Right;  . Cardiac catheterization  2013  . Orif tibia fracture Right 07/03/2013    Procedure: RIGHT TIBIA NON UNION REPAIR ;  Surgeon: Rozanna Box, MD;  Location: [place];  Service: Orthopedics;  Laterality: Right;  . Hardware removal Right 07/03/2013    Procedure: HARDWARE REMOVAL RIGHT TIBIA ;  Surgeon: Rozanna Box, MD;  Location: [place];  Service: Orthopedics;  Laterality: Right;  . Joint replacement      hip-right x2  . Tonsillectomy    . Total hip revision Right 09/17/2013    Procedure: REVISION RIGHT TOTAL HIP ARTHROPLASTY ;  Surgeon: [name], MD;  Location: [facility];  Service: Orthopedics;  Laterality: Right;    Social History:  reports that he quit smoking about 16 months ago. He has never used smokeless tobacco. He reports that he drinks alcohol. He reports that he does not use illicit drugs.  Allergies:  No Known Allergies  Medications: Medications Prior to Admission  Medication Sig Dispense Refill  . amiodarone (PACERONE) 100 MG tablet Take 100 mg by mouth daily.      . Ascorbic Acid (VITAMIN C)  1000 MG tablet Take 1,000 mg by mouth daily.      [name] atorvastatin (LIPITOR) 80 MG tablet Take 80 mg by mouth every evening.       . Cholecalciferol (VITAMIN D3) 5000 UNITS CAPS Take 5,000 Units by mouth daily.      . clonazePAM (KLONOPIN) 1 MG tablet Take one tablet by mouth three times daily  90 tablet  5  . diltiazem (CARDIZEM CD) 240 MG 24 hr capsule Take 240 mg by mouth every morning.       . docusate sodium 100 MG CAPS Take 100 mg by mouth 2 (two) times daily.  10 capsule  0  . DULoxetine (CYMBALTA) 60 MG capsule Take 60 mg by mouth 2 (two) times daily.      . ferrous sulfate 325 (65 FE) MG tablet Take 1 tablet (325 mg total) by mouth 3 (three) times daily after meals.    3  . HYDROcodone-acetaminophen (NORCO) 7.5-325 MG per tablet Take one tablet by mouth every  4 hours as needed for pain level 1-5; Take two tablets by mouth every 4 hours as needed for pain level 6-10  360 tablet  0  . levothyroxine (SYNTHROID, LEVOTHROID) 125 MCG tablet Take 250 mcg by mouth daily before breakfast.      . loratadine (CLARITIN) 10 MG tablet Take 10 mg by mouth daily.      . methocarbamol (ROBAXIN) 500 MG tablet Take 1 tablet (500 mg total) by mouth every 12 (twelve) hours as needed for muscle spasms.  50 tablet  0  . Multiple Vitamin (MULTIVITAMIN WITH MINERALS) TABS tablet Take 1 tablet by mouth daily.      [name] omeprazole (PRILOSEC) 20 MG capsule Take 20 mg by mouth daily.      [place] (OYSTER CALCIUM) 500 MG TABS tablet Take 500 mg of elemental calcium by mouth 2 (two) times daily.      . Polyethylene Glycol 400 (BLINK TEARS) 0.25 % SOLN Apply 1 drop to eye 2 (two) times daily as needed (dry eyes).      . Probiotic Product (PROBIOTIC PO) Take 1 tablet by mouth every morning.       . ranolazine (RANEXA) 500 MG 12 hr tablet Take 500 mg by mouth 2 (two) times daily.      . Rivaroxaban (XARELTO) 20 MG TABS Take 20 mg by mouth daily with supper.      . Tamsulosin HCl (FLOMAX) 0.4 MG CAPS Take 0.4 mg by mouth every evening.       [name] ENBREL SURECLICK 50 MG/ML injection 50 mg every 14 (fourteen) days. Last  Dose 09-07-13      . nitroGLYCERIN (NITROSTAT) 0.4 MG SL tablet Place 0.4 mg under the tongue every 5 (five) minutes as needed for chest pain. x3 doses as needed for chest pain      . polyethylene glycol (MIRALAX / GLYCOLAX) packet Take 17 g by mouth daily as needed. Constipation.      [name] testosterone cypionate (DEPOTESTOTERONE CYPIONATE) 200 MG/ML injection Inject 1 mL (200 mg total) into the muscle every 14 (fourteen) days.  10 mL  5    Results for orders placed during the hospital encounter of 10/15/13 (from the past 48 hour(s))  CBC     Status: Abnormal   Collection Time    10/15/13  6:02 PM      Result Value Range   WBC 11.7 (*) 4.0 - 10.5 K/uL   RBC 3.99 (*)  4.22 -  5.81 MIL/uL   Hemoglobin 10.0 (*) 13.0 - 17.0 g/dL   HCT 31.5 (*) 39.0 - 52.0 %   MCV 78.9  78.0 - 100.0 fL   MCH 25.1 (*) 26.0 - 34.0 pg   MCHC 31.7  30.0 - 36.0 g/dL   RDW 19.3 (*) 11.5 - 15.5 %   Platelets 303  150 - 400 K/uL  BASIC METABOLIC PANEL     Status: Abnormal   Collection Time    10/15/13  6:02 PM      Result Value Range   Sodium 131 (*) 137 - 147 mEq/L   Potassium 4.4  3.7 - 5.3 mEq/L   Chloride 94 (*) 96 - 112 mEq/L   CO2 23  19 - 32 mEq/L   Glucose, Bld 118 (*) 70 - 99 mg/dL   BUN 25 (*) 6 - 23 mg/dL   Creatinine, Ser 1.00  0.50 - 1.35 mg/dL   Calcium 8.8  8.4 - 10.5 mg/dL   GFR calc non Af Amer 75 (*) >90 mL/min   GFR calc Af Amer 87 (*) >90 mL/min   Comment: (NOTE)     The eGFR has been calculated using the CKD EPI equation.     This calculation has not been validated in all clinical situations.     eGFR's persistently <90 mL/min signify possible Chronic Kidney     Disease.   No results found.  ROS: Review of Systems - Negative except for the admission diagnosis No recent hospitalizations No fevers chills night sweats  No wound problems   Physican Exam: Blood pressure 136/45, pulse 87, temperature 97.5 F (36.4 C), temperature source Oral, resp. rate 20, height _0  (1.93 m), weight 139.254 kg (307 lb), SpO2 99.00%.  Awake alert comfortable Right hip shortened externally rotated NVI Cam walker boot in place for treatment of right tibia fracture treated through Dr. Marcelino Scot Heart regular Chest clear  Assessment/Plan Assessment:  Dislocated right total hip revision   Plan: Patient will undergo a closed reduction of hi right hip tonight 10/15/13. Risks benefits and expectation were discussed with the patient. Patient understand risks, benefits and expectation and wishes to proceed.   Pietro Cassis Alvan Dame, MD  10/15/2013, 7:34 PM

## 2013-10-16 ENCOUNTER — Encounter (HOSPITAL_COMMUNITY): Payer: Self-pay | Admitting: Orthopedic Surgery

## 2013-10-17 NOTE — Op Note (Signed)
NAMEWILFREDO, CANTERBURY              ACCOUNT NO.:  [id number]  MEDICAL RECORD NO.:  [id number]  LOCATION:  [facility]                         FACILITY:  [facility]  PHYSICIAN:  Pietro Cassis. Alvan Dame, M.D.  DATE OF BIRTH:  1946-05-15  DATE OF PROCEDURE:  10/15/2013 DATE OF DISCHARGE:  10/15/2013                              OPERATIVE REPORT   PREOPERATIVE DIAGNOSES: 1. Dislocated right total hip. 2. Persistent right hip instability.  POSTOPERATIVE DIAGNOSES: 1. Dislocated, right total hip. 2. Persistent right hip instability.  PROCEDURE:  Closed reduction of right total hip arthroplasty, examination under radiographic interpretation.  SURGEON:  Pietro Cassis. Alvan Dame, M.D.  ASSISTANT:  Surgical team.  ANESTHESIA:  General LMA.  SPECIMENS:  None.  COMPLICATIONS:  None.  DRAINS:  None.  INDICATIONS FOR PROCEDURE:  [name] is a 68 year old male with complex history involving his right hip.  He has previous fracture requiring conversion to hip arthroplasty with failure related to lack of bony ingrowth, he is now about a month out from revision surgery.  He had been initially doing very well, but had reported recent onset of symptoms of feeling that the hip was shaking, and moving back and forth. He had an incident recently where he stumbled and fell and had some increased discomfort and increased sensation of instability of the hip. He presented to the office today and was evaluated and had radiographic evidence of a dislocated hip.  He was taken to [facility] with planned closed reduction.  The risks, benefits and necessity of the  procedure were reviewed.  Consent was obtained.  PROCEDURE IN DETAIL:  The patient was brought to the operative theater. Once adequate anesthesia was established, time-out was performed identifying the patient, planned procedure of the extremity.  Attempted closed reduction maneuvers in standard fashion.  We were unsuccessful at Recognizing an obvious reduction  of the hip.  Had a portable x-ray in the room to evaluate for reduction and utilizing the plain films, I was able to reduce the hip but this required traction and lateral based pressure to reduce the hip.  The radiographs that time indicated appearance of subluxation of the hip.  After several attempts evaluating the motion of the hip, I determined that there was going to be persistent instability with an uncertain etiology.  At this point, the procedure was terminated and the patient was taken to recovery room in stable condition, extubated.  The findings were reviewed with his husband, [name], with the plan for him to require revision hip surgery.  This will be scheduled through our office at our convenience.  I feel unfortunately he has been dealing with this for at least a week or 2 and he will be able to continue to tolerate it until we can find time to address this formally.  Etiology of this persistent instability could be, 1. Infection. 2. Failure of his femoral component versus incompetence of the soft     tissue envelope around the hip and gluteus medius musculature.     Findings reviewed with his family.  He will be scheduled for revision surgery through our office.     Pietro Cassis Alvan Dame, M.D.     MDO/MEDQ  D:  10/16/2013  T:  10/17/2013  Job:  [id number]

## 2013-10-19 ENCOUNTER — Encounter (HOSPITAL_COMMUNITY): Payer: Self-pay | Admitting: Pharmacy Technician

## 2013-10-24 ENCOUNTER — Encounter (HOSPITAL_COMMUNITY): Payer: Self-pay

## 2013-10-24 ENCOUNTER — Encounter (HOSPITAL_COMMUNITY)
Admission: RE | Admit: 2013-10-24 | Discharge: 2013-10-24 | Disposition: A | Discharge status: Home / Self Care | Payer: Medicare Other | Source: Ambulatory Visit | Attending: Orthopedic Surgery | Admitting: Orthopedic Surgery

## 2013-10-24 DIAGNOSIS — Z01818 Encounter for other preprocedural examination: Secondary | ICD-10-CM | POA: Insufficient documentation

## 2013-10-24 DIAGNOSIS — Z01812 Encounter for preprocedural laboratory examination: Secondary | ICD-10-CM | POA: Insufficient documentation

## 2013-10-24 HISTORY — DX: Fracture of unspecified carpal bone, unspecified wrist, initial encounter for closed fracture: S62.109A

## 2013-10-24 HISTORY — DX: Psoriasis vulgaris: L40.0

## 2013-10-24 HISTORY — DX: Osteopathy in diseases classified elsewhere, unspecified site: M90.80

## 2013-10-24 HISTORY — DX: Age-related osteoporosis without current pathological fracture: M81.0

## 2013-10-24 HISTORY — DX: Metabolic disorder, unspecified: E88.9

## 2013-10-24 LAB — URINE MICROSCOPIC-ADD ON

## 2013-10-24 LAB — CBC
HCT: 30.4 % — ABNORMAL LOW (ref 39.0–52.0)
HEMOGLOBIN: 9.5 g/dL — AB (ref 13.0–17.0)
MCH: 25.3 pg — AB (ref 26.0–34.0)
MCHC: 31.3 g/dL (ref 30.0–36.0)
MCV: 81.1 fL (ref 78.0–100.0)
Platelets: 432 10*3/uL — ABNORMAL HIGH (ref 150–400)
RBC: 3.75 MIL/uL — ABNORMAL LOW (ref 4.22–5.81)
RDW: 19.8 % — ABNORMAL HIGH (ref 11.5–15.5)
WBC: 9 10*3/uL (ref 4.0–10.5)

## 2013-10-24 LAB — URINALYSIS, ROUTINE W REFLEX MICROSCOPIC
Bilirubin Urine: NEGATIVE
GLUCOSE, UA: NEGATIVE mg/dL
HGB URINE DIPSTICK: NEGATIVE
Ketones, ur: NEGATIVE mg/dL
Nitrite: POSITIVE — AB
PROTEIN: 30 mg/dL — AB
Specific Gravity, Urine: 1.026 (ref 1.005–1.030)
Urobilinogen, UA: 1 mg/dL (ref 0.0–1.0)
pH: 8 (ref 5.0–8.0)

## 2013-10-24 LAB — PROTIME-INR
INR: 1.39 (ref 0.00–1.49)
PROTHROMBIN TIME: 16.7 s — AB (ref 11.6–15.2)

## 2013-10-24 LAB — BASIC METABOLIC PANEL
BUN: 17 mg/dL (ref 6–23)
CO2: 25 meq/L (ref 19–32)
Calcium: 8.7 mg/dL (ref 8.4–10.5)
Chloride: 97 mEq/L (ref 96–112)
Creatinine, Ser: 0.97 mg/dL (ref 0.50–1.35)
GFR calc Af Amer: >90 mL/min (ref >90)
GFR calc non Af Amer: 83 mL/min — ABNORMAL LOW (ref >90)
GLUCOSE: 106 mg/dL — AB (ref 70–99)
POTASSIUM: 4.4 meq/L (ref 3.7–5.3)
SODIUM: 136 meq/L — AB (ref 137–147)

## 2013-10-24 LAB — APTT: aPTT: 40 seconds — ABNORMAL HIGH (ref 24–37)

## 2013-10-24 LAB — SURGICAL PCR SCREEN
MRSA, PCR: NEGATIVE
Staphylococcus aureus: NEGATIVE

## 2013-10-24 NOTE — Progress Notes (Signed)
EKG / CARDIAC CATH / OFFICE NOTE from [place] on chart

## 2013-10-24 NOTE — Patient Instructions (Addendum)
HARRIE CAZAREZ  10/24/2013                           YOUR PROCEDURE IS SCHEDULED ON: 10/26/13               PLEASE REPORT TO SHORT STAY CENTER AT : 6:00 AM               CALL THIS NUMBER IF ANY PROBLEMS THE DAY OF SURGERY :               [phone]                      REMEMBER:   Do not eat food or drink liquids AFTER MIDNIGHT   Take these medicines the morning of surgery with A SIP OF WATER: KLONOPIN / PRILOSEC / CLARITIN / DOXYCYCLINE / DULOXETINE / DILTIAZEM / AMIODARONE / RANEXA / FORTEO   Do not wear jewelry, make-up   Do not wear lotions, powders, or perfumes.   Do not shave legs or underarms 12 hrs. before surgery (men may shave face)  Do not bring valuables to the hospital.  Contacts, dentures or bridgework may not be worn into surgery.  Leave suitcase in the car. After surgery it may be brought to your room.  For patients admitted to the hospital more than one night, checkout time is 11:00                          The day of discharge.   Patients discharged the day of surgery will not be allowed to drive home                             If going home same day of surgery, must have someone stay with you first                           24 hrs at home and arrange for some one to drive you home from hospital.    Special Instructions:   Please read over the following fact sheets that you were given:               1. MRSA  INFORMATION                      2. [place]              3. INCENTIVE SPIROMETER               4. BRING C PAP [place]                                                X_____________________________________________________________________        Failure to follow these instructions may result in cancellation of your surgery

## 2013-10-24 NOTE — Progress Notes (Signed)
CBC / PT / PTT / UA FAXED TO DR. Alvan Dame

## 2013-10-25 ENCOUNTER — Other Ambulatory Visit: Payer: Self-pay | Admitting: Orthopedic Surgery

## 2013-10-25 MED ORDER — DEXTROSE 5 % IV SOLN
3.0000 g | INTRAVENOUS | Status: AC
  Administered 2013-10-26: 3 g via INTRAVENOUS
  Filled 2013-10-25: qty 3000

## 2013-10-26 ENCOUNTER — Inpatient Hospital Stay (HOSPITAL_COMMUNITY): Payer: Medicare Other

## 2013-10-26 ENCOUNTER — Encounter (HOSPITAL_COMMUNITY): Payer: Self-pay | Admitting: Anesthesiology

## 2013-10-26 ENCOUNTER — Encounter (HOSPITAL_COMMUNITY)
Admission: RE | Disposition: A | Discharge status: Skilled Nursing Facility | Payer: Self-pay | Source: Ambulatory Visit | Attending: Orthopedic Surgery

## 2013-10-26 ENCOUNTER — Inpatient Hospital Stay (HOSPITAL_COMMUNITY)
Admission: RE | Admit: 2013-10-26 | Discharge: 2013-10-30 | DRG: 467 | Disposition: A | Discharge status: Skilled Nursing Facility | Payer: Medicare Other | Source: Ambulatory Visit | Attending: Orthopedic Surgery | Admitting: Orthopedic Surgery

## 2013-10-26 ENCOUNTER — Encounter (HOSPITAL_COMMUNITY): Payer: Self-pay | Admitting: *Deleted

## 2013-10-26 ENCOUNTER — Encounter (HOSPITAL_COMMUNITY): Payer: Medicare Other | Admitting: Anesthesiology

## 2013-10-26 ENCOUNTER — Inpatient Hospital Stay (HOSPITAL_COMMUNITY): Payer: Medicare Other | Admitting: Anesthesiology

## 2013-10-26 DIAGNOSIS — Z96649 Presence of unspecified artificial hip joint: Secondary | ICD-10-CM

## 2013-10-26 DIAGNOSIS — M949 Disorder of cartilage, unspecified: Secondary | ICD-10-CM

## 2013-10-26 DIAGNOSIS — K219 Gastro-esophageal reflux disease without esophagitis: Secondary | ICD-10-CM | POA: Diagnosis present

## 2013-10-26 DIAGNOSIS — S7291XA Unspecified fracture of right femur, initial encounter for closed fracture: Secondary | ICD-10-CM

## 2013-10-26 DIAGNOSIS — G4730 Sleep apnea, unspecified: Secondary | ICD-10-CM | POA: Diagnosis present

## 2013-10-26 DIAGNOSIS — M899 Disorder of bone, unspecified: Secondary | ICD-10-CM | POA: Diagnosis present

## 2013-10-26 DIAGNOSIS — S52609A Unspecified fracture of lower end of unspecified ulna, initial encounter for closed fracture: Secondary | ICD-10-CM

## 2013-10-26 DIAGNOSIS — M8448XA Pathological fracture, other site, initial encounter for fracture: Secondary | ICD-10-CM | POA: Diagnosis present

## 2013-10-26 DIAGNOSIS — F411 Generalized anxiety disorder: Secondary | ICD-10-CM | POA: Diagnosis present

## 2013-10-26 DIAGNOSIS — Z6837 Body mass index (BMI) 37.0-37.9, adult: Secondary | ICD-10-CM

## 2013-10-26 DIAGNOSIS — L408 Other psoriasis: Secondary | ICD-10-CM | POA: Diagnosis present

## 2013-10-26 DIAGNOSIS — S52509A Unspecified fracture of the lower end of unspecified radius, initial encounter for closed fracture: Secondary | ICD-10-CM | POA: Diagnosis present

## 2013-10-26 DIAGNOSIS — E7800 Pure hypercholesterolemia, unspecified: Secondary | ICD-10-CM | POA: Diagnosis present

## 2013-10-26 DIAGNOSIS — S62109A Fracture of unspecified carpal bone, unspecified wrist, initial encounter for closed fracture: Secondary | ICD-10-CM | POA: Diagnosis present

## 2013-10-26 DIAGNOSIS — M810 Age-related osteoporosis without current pathological fracture: Secondary | ICD-10-CM | POA: Diagnosis present

## 2013-10-26 DIAGNOSIS — F329 Major depressive disorder, single episode, unspecified: Secondary | ICD-10-CM | POA: Diagnosis present

## 2013-10-26 DIAGNOSIS — IMO0001 Reserved for inherently not codable concepts without codable children: Secondary | ICD-10-CM

## 2013-10-26 DIAGNOSIS — E785 Hyperlipidemia, unspecified: Secondary | ICD-10-CM | POA: Diagnosis present

## 2013-10-26 DIAGNOSIS — I2510 Atherosclerotic heart disease of native coronary artery without angina pectoris: Secondary | ICD-10-CM | POA: Diagnosis present

## 2013-10-26 DIAGNOSIS — Z87440 Personal history of urinary (tract) infections: Secondary | ICD-10-CM

## 2013-10-26 DIAGNOSIS — E559 Vitamin D deficiency, unspecified: Secondary | ICD-10-CM | POA: Diagnosis present

## 2013-10-26 DIAGNOSIS — I10 Essential (primary) hypertension: Secondary | ICD-10-CM | POA: Diagnosis present

## 2013-10-26 DIAGNOSIS — Z79899 Other long term (current) drug therapy: Secondary | ICD-10-CM

## 2013-10-26 DIAGNOSIS — Z8249 Family history of ischemic heart disease and other diseases of the circulatory system: Secondary | ICD-10-CM

## 2013-10-26 DIAGNOSIS — D649 Anemia, unspecified: Secondary | ICD-10-CM | POA: Diagnosis present

## 2013-10-26 DIAGNOSIS — Z96659 Presence of unspecified artificial knee joint: Secondary | ICD-10-CM

## 2013-10-26 DIAGNOSIS — E039 Hypothyroidism, unspecified: Secondary | ICD-10-CM | POA: Diagnosis present

## 2013-10-26 DIAGNOSIS — Z87891 Personal history of nicotine dependence: Secondary | ICD-10-CM

## 2013-10-26 DIAGNOSIS — W19XXXA Unspecified fall, initial encounter: Secondary | ICD-10-CM | POA: Diagnosis present

## 2013-10-26 DIAGNOSIS — T84099A Other mechanical complication of unspecified internal joint prosthesis, initial encounter: Principal | ICD-10-CM | POA: Diagnosis present

## 2013-10-26 DIAGNOSIS — IMO0002 Reserved for concepts with insufficient information to code with codable children: Secondary | ICD-10-CM | POA: Diagnosis not present

## 2013-10-26 DIAGNOSIS — Y831 Surgical operation with implant of artificial internal device as the cause of abnormal reaction of the patient, or of later complication, without mention of misadventure at the time of the procedure: Secondary | ICD-10-CM | POA: Diagnosis present

## 2013-10-26 DIAGNOSIS — I4891 Unspecified atrial fibrillation: Secondary | ICD-10-CM | POA: Diagnosis present

## 2013-10-26 DIAGNOSIS — F3289 Other specified depressive episodes: Secondary | ICD-10-CM | POA: Diagnosis present

## 2013-10-26 HISTORY — PX: TOTAL HIP REVISION: SHX763

## 2013-10-26 LAB — URINE CULTURE: Colony Count: >=100000

## 2013-10-26 LAB — PREPARE RBC (CROSSMATCH)

## 2013-10-26 SURGERY — TOTAL HIP REVISION
Anesthesia: General | Site: Hip | Laterality: Right

## 2013-10-26 MED ORDER — PROMETHAZINE HCL 25 MG/ML IJ SOLN: 6.2500 mg | INTRAMUSCULAR | Status: DC | PRN

## 2013-10-26 MED ORDER — LIDOCAINE HCL (CARDIAC) 20 MG/ML IV SOLN
INTRAVENOUS | Status: AC
  Filled 2013-10-26: qty 5

## 2013-10-26 MED ORDER — LACTATED RINGERS IV SOLN
INTRAVENOUS | Status: DC | PRN
  Administered 2013-10-26 (×2): via INTRAVENOUS

## 2013-10-26 MED ORDER — LORATADINE 10 MG PO TABS
10.0000 mg | ORAL_TABLET | Freq: Every day | ORAL | Status: DC
  Administered 2013-10-26 – 2013-10-30 (×5): 10 mg via ORAL
  Filled 2013-10-26 (×5): qty 1

## 2013-10-26 MED ORDER — OXYCODONE HCL 5 MG PO TABS
5.0000 mg | ORAL_TABLET | ORAL | Status: DC
  Administered 2013-10-26: 15 mg via ORAL
  Administered 2013-10-26: 10 mg via ORAL
  Administered 2013-10-27 (×5): 15 mg via ORAL
  Administered 2013-10-28: 5 mg via ORAL
  Administered 2013-10-28 – 2013-10-30 (×10): 15 mg via ORAL
  Filled 2013-10-26 (×14): qty 3
  Filled 2013-10-26: qty 2
  Filled 2013-10-26: qty 1
  Filled 2013-10-26: qty 2
  Filled 2013-10-26 (×3): qty 3

## 2013-10-26 MED ORDER — SODIUM CHLORIDE 0.9 % IV SOLN
INTRAVENOUS | Status: DC | PRN
  Administered 2013-10-26 (×2): via INTRAVENOUS

## 2013-10-26 MED ORDER — ALUM & MAG HYDROXIDE-SIMETH 200-200-20 MG/5ML PO SUSP: 30.0000 mL | ORAL | Status: DC | PRN

## 2013-10-26 MED ORDER — CALCIUM CHLORIDE 10 % IV SOLN
1.0000 g | Freq: Once | INTRAVENOUS | Status: DC
Start: 2013-10-26 — End: 2013-10-26
  Filled 2013-10-26: qty 10

## 2013-10-26 MED ORDER — CISATRACURIUM BESYLATE 20 MG/10ML IV SOLN
INTRAVENOUS | Status: AC
Start: 2013-10-26 — End: 2013-10-26
  Filled 2013-10-26: qty 10

## 2013-10-26 MED ORDER — SODIUM CHLORIDE 0.9 % IJ SOLN
INTRAMUSCULAR | Status: AC
  Filled 2013-10-26: qty 3

## 2013-10-26 MED ORDER — PANTOPRAZOLE SODIUM 40 MG PO TBEC
40.0000 mg | DELAYED_RELEASE_TABLET | Freq: Every day | ORAL | Status: DC
  Administered 2013-10-26 – 2013-10-30 (×4): 40 mg via ORAL
  Filled 2013-10-26 (×5): qty 1

## 2013-10-26 MED ORDER — SODIUM CHLORIDE 0.9 % IJ SOLN
INTRAMUSCULAR | Status: AC
  Filled 2013-10-26: qty 10

## 2013-10-26 MED ORDER — FLEET ENEMA 7-19 GM/118ML RE ENEM: 1.0000 | ENEMA | Freq: Once | RECTAL | Status: AC | PRN

## 2013-10-26 MED ORDER — CALCIUM CHLORIDE 10 % IV SOLN
INTRAVENOUS | Status: DC | PRN
  Administered 2013-10-26 (×4): 250 mg via INTRAVENOUS

## 2013-10-26 MED ORDER — HYDROMORPHONE HCL PF 1 MG/ML IJ SOLN
0.5000 mg | INTRAMUSCULAR | Status: DC | PRN
  Administered 2013-10-26: 1 mg via INTRAVENOUS
  Administered 2013-10-26 – 2013-10-27 (×2): 2 mg via INTRAVENOUS
  Administered 2013-10-27 (×3): 1 mg via INTRAVENOUS
  Administered 2013-10-27: 2 mg via INTRAVENOUS
  Administered 2013-10-28: 1 mg via INTRAVENOUS
  Administered 2013-10-28: 2 mg via INTRAVENOUS
  Filled 2013-10-26 (×2): qty 1
  Filled 2013-10-26 (×2): qty 2
  Filled 2013-10-26: qty 1
  Filled 2013-10-26: qty 2
  Filled 2013-10-26: qty 1
  Filled 2013-10-26: qty 2
  Filled 2013-10-26 (×2): qty 1
  Filled 2013-10-26: qty 2

## 2013-10-26 MED ORDER — GLYCOPYRROLATE 0.2 MG/ML IJ SOLN
INTRAMUSCULAR | Status: DC | PRN
  Administered 2013-10-26: 0.2 mg via INTRAVENOUS

## 2013-10-26 MED ORDER — HYDROMORPHONE HCL PF 1 MG/ML IJ SOLN
INTRAMUSCULAR | Status: AC
  Filled 2013-10-26: qty 1

## 2013-10-26 MED ORDER — POLYVINYL ALCOHOL 1.4 % OP SOLN
1.0000 [drp] | Freq: Two times a day (BID) | OPHTHALMIC | Status: DC | PRN
  Filled 2013-10-26: qty 15

## 2013-10-26 MED ORDER — PROPOFOL 10 MG/ML IV BOLUS
INTRAVENOUS | Status: AC
  Filled 2013-10-26: qty 20

## 2013-10-26 MED ORDER — AMIODARONE HCL 100 MG PO TABS
100.0000 mg | ORAL_TABLET | Freq: Every day | ORAL | Status: DC
  Administered 2013-10-26 – 2013-10-30 (×5): 100 mg via ORAL
  Filled 2013-10-26 (×5): qty 1

## 2013-10-26 MED ORDER — POLYETHYLENE GLYCOL 400 0.25 % OP SOLN: 1.0000 [drp] | Freq: Two times a day (BID) | OPHTHALMIC | Status: DC | PRN

## 2013-10-26 MED ORDER — ATORVASTATIN CALCIUM 80 MG PO TABS
80.0000 mg | ORAL_TABLET | Freq: Every evening | ORAL | Status: DC
  Administered 2013-10-26 – 2013-10-29 (×4): 80 mg via ORAL
  Filled 2013-10-26 (×5): qty 1

## 2013-10-26 MED ORDER — LACTATED RINGERS IV SOLN
INTRAVENOUS | Status: DC | PRN
  Administered 2013-10-26 (×3): via INTRAVENOUS

## 2013-10-26 MED ORDER — BISACODYL 10 MG RE SUPP: 10.0000 mg | Freq: Every day | RECTAL | Status: DC | PRN

## 2013-10-26 MED ORDER — TAMSULOSIN HCL 0.4 MG PO CAPS
0.4000 mg | ORAL_CAPSULE | Freq: Every evening | ORAL | Status: DC
  Administered 2013-10-26 – 2013-10-29 (×4): 0.4 mg via ORAL
  Filled 2013-10-26 (×5): qty 1

## 2013-10-26 MED ORDER — SODIUM CHLORIDE 0.9 % IR SOLN
Status: DC | PRN
  Administered 2013-10-26 (×2): 3000 mL

## 2013-10-26 MED ORDER — FENTANYL CITRATE 0.05 MG/ML IJ SOLN
INTRAMUSCULAR | Status: AC
  Filled 2013-10-26: qty 5

## 2013-10-26 MED ORDER — FENTANYL CITRATE 0.05 MG/ML IJ SOLN
INTRAMUSCULAR | Status: AC
  Filled 2013-10-26: qty 2

## 2013-10-26 MED ORDER — MENTHOL 3 MG MT LOZG
1.0000 | LOZENGE | OROMUCOSAL | Status: DC | PRN
  Filled 2013-10-26: qty 9

## 2013-10-26 MED ORDER — DEXTROSE 5 % IV SOLN
500.0000 mg | Freq: Four times a day (QID) | INTRAVENOUS | Status: DC | PRN
  Administered 2013-10-26: 500 mg via INTRAVENOUS
  Filled 2013-10-26: qty 5

## 2013-10-26 MED ORDER — LEVOTHYROXINE SODIUM 125 MCG PO TABS
250.0000 ug | ORAL_TABLET | Freq: Every day | ORAL | Status: DC
  Administered 2013-10-27 – 2013-10-30 (×3): 250 ug via ORAL
  Filled 2013-10-26 (×5): qty 2

## 2013-10-26 MED ORDER — FENTANYL CITRATE 0.05 MG/ML IJ SOLN
INTRAMUSCULAR | Status: DC | PRN
  Administered 2013-10-26: 50 ug via INTRAVENOUS
  Administered 2013-10-26: 150 ug via INTRAVENOUS
  Administered 2013-10-26 (×3): 50 ug via INTRAVENOUS

## 2013-10-26 MED ORDER — RANOLAZINE ER 500 MG PO TB12
500.0000 mg | ORAL_TABLET | Freq: Two times a day (BID) | ORAL | Status: DC
  Administered 2013-10-26 – 2013-10-30 (×7): 500 mg via ORAL
  Filled 2013-10-26 (×9): qty 1

## 2013-10-26 MED ORDER — CHLORHEXIDINE GLUCONATE 4 % EX LIQD: 60.0000 mL | Freq: Once | CUTANEOUS | Status: DC

## 2013-10-26 MED ORDER — KETAMINE HCL 10 MG/ML IJ SOLN
INTRAMUSCULAR | Status: DC | PRN
  Administered 2013-10-26: 30 mg via INTRAVENOUS
  Administered 2013-10-26 (×2): 10 mg via INTRAVENOUS

## 2013-10-26 MED ORDER — CELECOXIB 200 MG PO CAPS
200.0000 mg | ORAL_CAPSULE | Freq: Two times a day (BID) | ORAL | Status: DC
  Administered 2013-10-26 – 2013-10-30 (×7): 200 mg via ORAL
  Filled 2013-10-26 (×9): qty 1

## 2013-10-26 MED ORDER — CISATRACURIUM BESYLATE (PF) 10 MG/5ML IV SOLN
INTRAVENOUS | Status: DC | PRN
  Administered 2013-10-26: 8 mg via INTRAVENOUS

## 2013-10-26 MED ORDER — DEXTROSE 5 % IV SOLN: 3.0000 g | Freq: Four times a day (QID) | INTRAVENOUS | Status: DC

## 2013-10-26 MED ORDER — NITROGLYCERIN 0.4 MG SL SUBL: 0.4000 mg | SUBLINGUAL_TABLET | SUBLINGUAL | Status: DC | PRN

## 2013-10-26 MED ORDER — METOCLOPRAMIDE HCL 10 MG PO TABS: 5.0000 mg | ORAL_TABLET | Freq: Three times a day (TID) | ORAL | Status: DC | PRN

## 2013-10-26 MED ORDER — METHOCARBAMOL 500 MG PO TABS
500.0000 mg | ORAL_TABLET | Freq: Four times a day (QID) | ORAL | Status: DC | PRN
  Administered 2013-10-27 – 2013-10-30 (×5): 500 mg via ORAL
  Filled 2013-10-26 (×5): qty 1

## 2013-10-26 MED ORDER — SODIUM CHLORIDE 0.9 % IV SOLN
10.0000 mg | INTRAVENOUS | Status: DC | PRN
  Administered 2013-10-26: 50 ug/min via INTRAVENOUS

## 2013-10-26 MED ORDER — MEPERIDINE HCL 50 MG/ML IJ SOLN: 6.2500 mg | INTRAMUSCULAR | Status: DC | PRN

## 2013-10-26 MED ORDER — CEFAZOLIN SODIUM-DEXTROSE 2-3 GM-% IV SOLR: 2.0000 g | INTRAVENOUS | Status: DC

## 2013-10-26 MED ORDER — LACTATED RINGERS IV SOLN: INTRAVENOUS | Status: DC

## 2013-10-26 MED ORDER — SODIUM CHLORIDE 0.9 % IV SOLN
100.0000 mL/h | INTRAVENOUS | Status: DC
  Administered 2013-10-26 – 2013-10-27 (×2): 100 mL/h via INTRAVENOUS
  Administered 2013-10-29: 10 mL/h via INTRAVENOUS
  Filled 2013-10-26 (×14): qty 1000

## 2013-10-26 MED ORDER — EPHEDRINE SULFATE 50 MG/ML IJ SOLN
INTRAMUSCULAR | Status: AC
  Filled 2013-10-26: qty 1

## 2013-10-26 MED ORDER — DULOXETINE HCL 60 MG PO CPEP
60.0000 mg | ORAL_CAPSULE | Freq: Two times a day (BID) | ORAL | Status: DC
  Administered 2013-10-26 – 2013-10-30 (×6): 60 mg via ORAL
  Filled 2013-10-26 (×9): qty 1

## 2013-10-26 MED ORDER — EPHEDRINE SULFATE 50 MG/ML IJ SOLN
INTRAMUSCULAR | Status: DC | PRN
  Administered 2013-10-26 (×3): 10 mg via INTRAVENOUS

## 2013-10-26 MED ORDER — PHENOL 1.4 % MT LIQD
1.0000 | OROMUCOSAL | Status: DC | PRN
Start: 2013-10-26 — End: 2013-10-30
  Filled 2013-10-26: qty 177

## 2013-10-26 MED ORDER — METOCLOPRAMIDE HCL 5 MG/ML IJ SOLN: 5.0000 mg | Freq: Three times a day (TID) | INTRAMUSCULAR | Status: DC | PRN

## 2013-10-26 MED ORDER — HYDROMORPHONE HCL PF 2 MG/ML IJ SOLN
INTRAMUSCULAR | Status: AC
  Filled 2013-10-26: qty 1

## 2013-10-26 MED ORDER — DEXTROSE 5 % IV SOLN
3.0000 g | Freq: Once | INTRAVENOUS | Status: AC
  Administered 2013-10-26: 3 g via INTRAVENOUS
  Filled 2013-10-26: qty 3000

## 2013-10-26 MED ORDER — HYDROMORPHONE HCL PF 1 MG/ML IJ SOLN
0.2500 mg | INTRAMUSCULAR | Status: DC | PRN
Start: 2013-10-26 — End: 2013-10-26
  Administered 2013-10-26 (×2): 0.5 mg via INTRAVENOUS

## 2013-10-26 MED ORDER — DIPHENHYDRAMINE HCL 25 MG PO CAPS: 25.0000 mg | ORAL_CAPSULE | Freq: Four times a day (QID) | ORAL | Status: DC | PRN

## 2013-10-26 MED ORDER — PHENYLEPHRINE HCL 10 MG/ML IJ SOLN
INTRAMUSCULAR | Status: DC | PRN
  Administered 2013-10-26 (×4): 80 ug via INTRAVENOUS

## 2013-10-26 MED ORDER — ONDANSETRON HCL 4 MG/2ML IJ SOLN
INTRAMUSCULAR | Status: DC | PRN
Start: 2013-10-26 — End: 2013-10-26
  Administered 2013-10-26: 4 mg via INTRAVENOUS

## 2013-10-26 MED ORDER — 0.9 % SODIUM CHLORIDE (POUR BTL) OPTIME
TOPICAL | Status: DC | PRN
  Administered 2013-10-26: 1000 mL

## 2013-10-26 MED ORDER — LIDOCAINE HCL (CARDIAC) 20 MG/ML IV SOLN
INTRAVENOUS | Status: DC | PRN
  Administered 2013-10-26: 100 mg via INTRAVENOUS

## 2013-10-26 MED ORDER — ONDANSETRON HCL 4 MG PO TABS: 4.0000 mg | ORAL_TABLET | Freq: Four times a day (QID) | ORAL | Status: DC | PRN

## 2013-10-26 MED ORDER — PROPOFOL 10 MG/ML IV BOLUS
INTRAVENOUS | Status: DC | PRN
  Administered 2013-10-26: 200 mg via INTRAVENOUS

## 2013-10-26 MED ORDER — CLONAZEPAM 1 MG PO TABS
1.0000 mg | ORAL_TABLET | Freq: Three times a day (TID) | ORAL | Status: DC | PRN
  Administered 2013-10-26 – 2013-10-28 (×3): 1 mg via ORAL
  Filled 2013-10-26 (×3): qty 1

## 2013-10-26 MED ORDER — ONDANSETRON HCL 4 MG/2ML IJ SOLN
INTRAMUSCULAR | Status: AC
  Filled 2013-10-26: qty 2

## 2013-10-26 MED ORDER — ZOLPIDEM TARTRATE 5 MG PO TABS
5.0000 mg | ORAL_TABLET | Freq: Every evening | ORAL | Status: DC | PRN
Start: 2013-10-26 — End: 2013-10-30

## 2013-10-26 MED ORDER — DILTIAZEM HCL ER COATED BEADS 240 MG PO CP24
240.0000 mg | ORAL_CAPSULE | Freq: Every morning | ORAL | Status: DC
  Administered 2013-10-27 – 2013-10-30 (×4): 240 mg via ORAL
  Filled 2013-10-26 (×4): qty 1

## 2013-10-26 MED ORDER — PHENYLEPHRINE 40 MCG/ML (10ML) SYRINGE FOR IV PUSH (FOR BLOOD PRESSURE SUPPORT)
PREFILLED_SYRINGE | INTRAVENOUS | Status: AC
  Filled 2013-10-26: qty 10

## 2013-10-26 MED ORDER — HYDROMORPHONE HCL PF 1 MG/ML IJ SOLN
INTRAMUSCULAR | Status: DC | PRN
  Administered 2013-10-26 (×5): .4 mg via INTRAVENOUS

## 2013-10-26 MED ORDER — CEFAZOLIN SODIUM-DEXTROSE 2-3 GM-% IV SOLR
2.0000 g | Freq: Four times a day (QID) | INTRAVENOUS | Status: AC
  Administered 2013-10-26 – 2013-10-27 (×2): 2 g via INTRAVENOUS
  Filled 2013-10-26 (×2): qty 50

## 2013-10-26 MED ORDER — SUCCINYLCHOLINE CHLORIDE 20 MG/ML IJ SOLN
INTRAMUSCULAR | Status: DC | PRN
  Administered 2013-10-26: 100 mg via INTRAVENOUS

## 2013-10-26 MED ORDER — FERROUS SULFATE 325 (65 FE) MG PO TABS
325.0000 mg | ORAL_TABLET | Freq: Three times a day (TID) | ORAL | Status: DC
  Administered 2013-10-26 – 2013-10-30 (×11): 325 mg via ORAL
  Filled 2013-10-26 (×14): qty 1

## 2013-10-26 MED ORDER — POLYETHYLENE GLYCOL 3350 17 G PO PACK
17.0000 g | PACK | Freq: Every day | ORAL | Status: DC | PRN
  Administered 2013-10-28: 17 g via ORAL

## 2013-10-26 MED ORDER — ONDANSETRON HCL 4 MG/2ML IJ SOLN: 4.0000 mg | Freq: Four times a day (QID) | INTRAMUSCULAR | Status: DC | PRN

## 2013-10-26 MED ORDER — DOCUSATE SODIUM 100 MG PO CAPS
100.0000 mg | ORAL_CAPSULE | Freq: Two times a day (BID) | ORAL | Status: DC
  Administered 2013-10-26 – 2013-10-30 (×7): 100 mg via ORAL

## 2013-10-26 SURGICAL SUPPLY — 70 items
ADH SKN CLS APL DERMABOND .7 (GAUZE/BANDAGES/DRESSINGS) ×1
ARTICULEZE HEAD 36 12 (Hips) ×2 IMPLANT
ARTICULEZE HEAD 36MM 12 (Hips) ×1 IMPLANT
BAG SPEC THK2 15X12 ZIP CLS (MISCELLANEOUS) ×1
BAG ZIPLOCK 12X15 (MISCELLANEOUS) ×3 IMPLANT
BLADE SAW SGTL 18X1.27X75 (BLADE) ×2 IMPLANT
BLADE SAW SGTL 18X1.27X75MM (BLADE) ×1
BRUSH FEMORAL CANAL (MISCELLANEOUS) IMPLANT
CATH FOLEY 2WAY SLVR  5CC 16FR (CATHETERS) ×2
CATH FOLEY 2WAY SLVR 5CC 16FR (CATHETERS) IMPLANT
CUP PINNACLE ACETABULAR SZ 62 (Orthopedic Implant) ×2 IMPLANT
DERMABOND ADVANCED (GAUZE/BANDAGES/DRESSINGS) ×2
DERMABOND ADVANCED .7 DNX12 (GAUZE/BANDAGES/DRESSINGS) ×1 IMPLANT
DRAPE INCISE IOBAN 85X60 (DRAPES) ×3 IMPLANT
DRAPE ORTHO SPLIT 77X108 STRL (DRAPES) ×6
DRAPE POUCH INSTRU U-SHP 10X18 (DRAPES) ×3 IMPLANT
DRAPE SURG 17X11 SM STRL (DRAPES) ×3 IMPLANT
DRAPE SURG ORHT 6 SPLT 77X108 (DRAPES) ×2 IMPLANT
DRAPE U-SHAPE 47X51 STRL (DRAPES) ×3 IMPLANT
DRSG AQUACEL AG ADV 3.5X10 (GAUZE/BANDAGES/DRESSINGS) IMPLANT
DRSG AQUACEL AG ADV 3.5X14 (GAUZE/BANDAGES/DRESSINGS) ×1 IMPLANT
DRSG EMULSION OIL 3X16 NADH (GAUZE/BANDAGES/DRESSINGS) ×3 IMPLANT
DRSG MEPILEX BORDER 4X4 (GAUZE/BANDAGES/DRESSINGS) ×1 IMPLANT
DRSG MEPILEX BORDER 4X8 (GAUZE/BANDAGES/DRESSINGS) ×1 IMPLANT
DRSG PAD ABDOMINAL 8X10 ST (GAUZE/BANDAGES/DRESSINGS) ×4 IMPLANT
DRSG TEGADERM 4X4.75 (GAUZE/BANDAGES/DRESSINGS) ×1 IMPLANT
DURAPREP 26ML APPLICATOR (WOUND CARE) ×5 IMPLANT
ELECT BLADE TIP CTD 4 INCH (ELECTRODE) ×3 IMPLANT
ELECT REM PT RETURN 9FT ADLT (ELECTROSURGICAL) ×3
ELECTRODE REM PT RTRN 9FT ADLT (ELECTROSURGICAL) ×1 IMPLANT
EVACUATOR 1/8 PVC DRAIN (DRAIN) ×1 IMPLANT
FACESHIELD LNG OPTICON STERILE (SAFETY) ×12 IMPLANT
GAUZE SPONGE 2X2 8PLY STRL LF (GAUZE/BANDAGES/DRESSINGS) ×1 IMPLANT
GAUZE XEROFORM 5X9 LF (GAUZE/BANDAGES/DRESSINGS) ×4 IMPLANT
GLOVE BIOGEL PI IND STRL 7.5 (GLOVE) ×1 IMPLANT
GLOVE BIOGEL PI IND STRL 8 (GLOVE) ×1 IMPLANT
GLOVE BIOGEL PI INDICATOR 7.5 (GLOVE) ×2
GLOVE BIOGEL PI INDICATOR 8 (GLOVE) ×2
GLOVE ECLIPSE 8.0 STRL XLNG CF (GLOVE) ×6 IMPLANT
GOWN SPEC L3 XXLG W/TWL (GOWN DISPOSABLE) ×6 IMPLANT
GOWN STRL REUS W/TWL LRG LVL3 (GOWN DISPOSABLE) ×3 IMPLANT
HANDPIECE INTERPULSE COAX TIP (DISPOSABLE)
HEAD ARTICULEZE 36 12 (Hips) IMPLANT
KIT BASIN OR (CUSTOM PROCEDURE TRAY) ×3 IMPLANT
LINER ACET HIP P4 10D 36X56 (Liner) ×2 IMPLANT
MANIFOLD NEPTUNE II (INSTRUMENTS) ×3 IMPLANT
NS IRRIG 1000ML POUR BTL (IV SOLUTION) ×4 IMPLANT
PACK TOTAL JOINT (CUSTOM PROCEDURE TRAY) ×3 IMPLANT
PIN ESC CONSTR ACE LINER 36X56 ×2 IMPLANT
POSITIONER SURGICAL ARM (MISCELLANEOUS) ×3 IMPLANT
PRESSURIZER FEMORAL UNIV (MISCELLANEOUS) IMPLANT
SCREW 6.5MMX30MM (Screw) ×2 IMPLANT
SCREW CANN TI 32 THRD 6.5X90 (Screw) ×2 IMPLANT
SCREW PINN CAN BONE 6.5MMX15MM (Screw) ×2 IMPLANT
SCREW PINN CAN BONE 6.5X50MM (Screw) ×2 IMPLANT
SET HNDPC FAN SPRY TIP SCT (DISPOSABLE) IMPLANT
SPONGE GAUZE 2X2 STER 10/PKG (GAUZE/BANDAGES/DRESSINGS) ×2
SPONGE GAUZE 4X4 12PLY (GAUZE/BANDAGES/DRESSINGS) ×4 IMPLANT
SPONGE LAP 18X18 X RAY DECT (DISPOSABLE) ×5 IMPLANT
SPONGE LAP 4X18 X RAY DECT (DISPOSABLE) ×1 IMPLANT
STAPLER VISISTAT 35W (STAPLE) ×3 IMPLANT
SUCTION FRAZIER TIP 10 FR DISP (SUCTIONS) ×3 IMPLANT
SUT VIC AB 1 CT1 36 (SUTURE) ×9 IMPLANT
SUT VIC AB 2-0 CT1 27 (SUTURE) ×12
SUT VIC AB 2-0 CT1 TAPERPNT 27 (SUTURE) ×3 IMPLANT
SUT VLOC 180 0 24IN GS25 (SUTURE) ×6 IMPLANT
TOWEL OR 17X26 10 PK STRL BLUE (TOWEL DISPOSABLE) ×6 IMPLANT
TOWER CARTRIDGE SMART MIX (DISPOSABLE) IMPLANT
TRAY FOLEY CATH 14FRSI W/METER (CATHETERS) ×1 IMPLANT
WATER STERILE IRR 1500ML POUR (IV SOLUTION) ×3 IMPLANT

## 2013-10-26 NOTE — Anesthesia Postprocedure Evaluation (Signed)
  Anesthesia Post-op Note  Patient: [name]  Procedure(s) Performed: Procedure(s) (LRB): REVISION RIGHT TOTAL HIP ARTHROPLASTY (Right)  Patient Location: PACU  Anesthesia Type: General  Level of Consciousness: awake and alert   Airway and Oxygen Therapy: Patient Spontanous Breathing  Post-op Pain: mild  Post-op Assessment: Post-op Vital signs reviewed, Patient's Cardiovascular Status Stable, Respiratory Function Stable, Patent Airway and No signs of Nausea or vomiting  Last Vitals:  Filed Vitals:   10/26/13 1615  BP:   Pulse: 69  Temp:   Resp: 17    Post-op Vital Signs: stable   Complications: No apparent anesthesia complications

## 2013-10-26 NOTE — H&P (Signed)
TOTAL HIP REVISION ADMISSION H&P   Patient is admitted for revision of right total hip arthroplasty vs resection with placement of antibiotic spacer.  Subjective:  Chief Complaint: Right hip pain / instability  HPI: [name], 68 y.o. male, has a history of pain and functional disability in the right hip due to trauma. A In August of 2013 he fell, broke his hip and a total hip replacement was performed to fix the hip. In December of 2013 he fell and caused a periprosthetic fracture on the right hip as well and wrist fracture. Upon return to the clinic it was noted that he had almost no boney ingrowth of the prosthesis. He was worked up for infection. It was decided to revise the hip components. While waiting to do hip surgery he rebroke his ankle through a plate and screws holding a previous fracture in place. He has also been diagnosed with compression fractures of the lumbar spine. Through all of the fractures he has sustained he saw an endocrinologist that diagnosed him with Metabolic Bone Disease.   He had a revision of the right hip and was doing well, he then returned to the clinic and was brought to the OR to reduce the right hip after dislocating the hip. After multiple attempts the hip remained unstable and would not stay reduced.  Discussion was had regarding the possible reasons fot eh instability.  He is now ready to proceed with a revision of the right total hip arthroplasty vs resection of the hip arthroplasty and placement of an antibiotic spacer, if there is infection. Risks, benefits and expectations were discussed with the patient. Risks including but not limited to the risk of anesthesia, blood clots, nerve damage, blood vessel damage, failure of the prosthesis, infection and up to and including death. Patient understand the risks, benefits and expectations and wishes to proceed with surgery.   D/C Plans: SNF   Post-op Meds: No Rx given   Tranexamic Acid: Not to be given -  CAD   Decadron: Not to be given   FYI:  CPAP   Xarelto post-op   Oxycodone post-op   Patient Active Problem List    Diagnosis  Date Noted   .  Nonunion, fracture, Right tibia  07/03/2013   .  UTI (urinary tract infection)  07/03/2013   .  Pathological fracture due to osteoporosis with delayed healing  04/24/2013   .  Osteoporosis with fracture  04/24/2013   .  Rash and nonspecific skin eruption  03/08/2013   .  Syncope  02/21/2013   .  PAF (paroxysmal atrial fibrillation)  02/21/2013   .  Chronic anticoagulation  02/21/2013   .  Fall  02/08/2013   .  Physical deconditioning  02/08/2013   .  Low back pain radiating to both legs  02/08/2013   .  Compression fracture of L1 lumbar vertebra  02/08/2013   .  Right tibial fracture  02/05/2013   .  Obesity, unspecified  02/05/2013   .  Osteomyelitis of right wrist  11/02/2012   .  Anemia  10/06/2012   .  HTN (hypertension)  10/03/2012   .  Psoriasis  10/03/2012   .  Dyslipidemia  10/03/2012   .  Vitamin D deficiency  10/03/2012   .  GERD (gastroesophageal reflux disease)  10/03/2012   .  Hyperglycemia  10/03/2012   .  Anxiety  10/03/2012   .  Depression  10/03/2012   .  CAD (coronary artery  disease)  10/03/2012   .  DJD (degenerative joint disease)  10/03/2012   .  Chronic gingivitis  10/03/2012   .  Low testosterone, possible hypogonadism  10/01/2012   .  Normocytic anemia  09/30/2012   .  Right femoral fracture  09/29/2012   .  Right forearm fracture  09/29/2012   .  Atrial fibrillation  09/29/2012   .  Hypothyroidism  09/29/2012   .  History of recurrent UTIs  09/29/2012    Past Medical History   Diagnosis  Date   .  Acid reflux    .  Thyroid disease    .  Low testosterone, possible hypogonadism  10/01/2012   .  Lumbago    .  High cholesterol    .  Anxiety    .  Depression    .  CAD (coronary artery disease)    .  Hypertension      patient states he does not have high blood pressure   .  Arthritis    .   Environmental allergies    .  Vertebral compression fracture      L1-wears brace   .  Sleep apnea      cpap machine-settings 17   .  Transfusion history      s/p 12'13 hip surgery   .  A-fib      hx.- sinus rhythm now    Past Surgical History   Procedure  Laterality  Date   .  Knee surgery  Bilateral  2012     Total knee replacements   .  Appendectomy     .  Right femur surgery   05/2012   .  Orif wrist fracture   10/02/2012     Procedure: OPEN REDUCTION INTERNAL FIXATION (ORIF) WRIST FRACTURE; Surgeon: Roseanne Kaufman, MD; Location: [facility]; Service: Orthopedics; Laterality: Right; WITH ANTIBIOTIC CEMENT   .  Total hip revision   10/05/2012     Procedure: TOTAL HIP REVISION; Surgeon: [name], MD; Location: [facility]; Service: Orthopedics; Laterality: Right; RIGHT TOTAL HIP REVISION   .  Hardware removal   10/05/2012     Procedure: HARDWARE REMOVAL; Surgeon: [name], MD; Location: [facility]; Service: Orthopedics; Laterality: Right; REMOVING STRYKER GAMMA NAIL   .  Wrist fracture surgery   05/2012   .  Orif tibia fracture  Right  02/06/2013     Procedure: OPEN REDUCTION INTERNAL FIXATION (ORIF) TIBIA FRACTURE WITH IM ROD FIBULA; Surgeon: Rozanna Box, MD; Location: [place]; Service: Orthopedics; Laterality: Right;   .  Cardiac catheterization   2013   .  Orif tibia fracture  Right  07/03/2013     Procedure: RIGHT TIBIA NON UNION REPAIR ; Surgeon: Rozanna Box, MD; Location: [place]; Service: Orthopedics; Laterality: Right;   .  Hardware removal  Right  07/03/2013     Procedure: HARDWARE REMOVAL RIGHT TIBIA ; Surgeon: Rozanna Box, MD; Location: [place]; Service: Orthopedics; Laterality: Right;   .  Joint replacement       hip-right x2   .  Tonsillectomy      No prescriptions prior to admission    No Known Allergies   History   Substance Use Topics   .  Smoking status:  Former Smoker     Quit date:  06/05/2012   .  Smokeless tobacco:  Never Used   .  Alcohol Use:  Yes       Comment: occasional-social  Family History   Problem  Relation  Age of Onset   .  CAD  Father  38   .  Arthritis  Mother     Review of Systems  Constitutional: Negative.  HENT: Negative.  Eyes: Negative.  Respiratory: Positive for shortness of breath.  Cardiovascular: Negative.  Gastrointestinal: Negative.  Genitourinary: Positive for frequency.  Musculoskeletal: Positive for back pain and joint pain.  Skin: Positive for rash.  Neurological: Negative.  Endo/Heme/Allergies: Positive for environmental allergies.  Psychiatric/Behavioral: Positive for depression. The patient is nervous/anxious.   Objective:  Physical Exam  Constitutional: He is oriented to person, place, and time. He appears well-developed and well-nourished.  HENT:  Head: Normocephalic and atraumatic.  Mouth/Throat: Oropharynx is clear and moist.  Eyes: Pupils are equal, round, and reactive to light.  Neck: Neck supple. No JVD present. No tracheal deviation present. No thyromegaly present.  Cardiovascular: Normal rate, regular rhythm, normal heart sounds and intact distal pulses.  Respiratory: Effort normal and breath sounds normal. No stridor. No respiratory distress. He has no wheezes.  GI: Soft. There is no tenderness. There is no guarding.  Musculoskeletal:  Right hip: He exhibits decreased range of motion, decreased strength, instability, tenderness, bony tenderness and laceration (healed). He exhibits no swelling, no crepitus and no deformity.  Lymphadenopathy:  He has no cervical adenopathy.  Neurological: He is alert and oriented to person, place, and time.  Skin: Skin is warm and dry.  Psychiatric: He has a normal mood and affect.   Imaging Review:  Plain radiographs demonstrate previous total hip arthroplasty of the right hip(s). There is evidence of loosening of the possible non-union of the previous fracture.The bone quality appears to be poor for age and reported activity level.    Assessment/Plan:  Right hip(s) with failed previous arthroplasty vs infection.   The patient history, physical examination, clinical judgement of the provider and imaging studies are consistent with previous total hip arthroplasty with articular surface wear / non-union of the right hip(s), previous total hip arthroplasty. Revision total hip arthroplasty vs resection of hip arthroplasty and placement of antibiotic spacer is deemed medically necessary. The treatment options including medical management, injection therapy, arthroscopy and arthroplasty were discussed at length. The risks and benefits of total hip arthroplasty were presented and reviewed. The risks due to aseptic loosening, infection, stiffness, dislocation/subluxation, thromboembolic complications and other imponderables were discussed. The patient acknowledged the explanation, agreed to proceed with the plan and consent was signed. Patient is being admitted for inpatient treatment for surgery, pain control, PT, OT, prophylactic antibiotics, VTE prophylaxis, progressive ambulation and ADL's and discharge planning. The patient is planning to be discharged to skilled nursing facility.       [place] Kyzer Blowe   PAC  10/26/2013, 7:23 AM

## 2013-10-26 NOTE — Transfer of Care (Signed)
Immediate Anesthesia Transfer of Care Note  Patient: [name]  Procedure(s) Performed: Procedure(s): REVISION RIGHT TOTAL HIP ARTHROPLASTY (Right)  Patient Location: PACU  Anesthesia Type:General  Level of Consciousness: awake, alert  and oriented  Airway & Oxygen Therapy: Patient Spontanous Breathing and Patient connected to face mask oxygen  Post-op Assessment: Report given to PACU RN and Post -op Vital signs reviewed and stable  Post vital signs: Reviewed and stable  Complications: No apparent anesthesia complications

## 2013-10-26 NOTE — Progress Notes (Signed)
Portable AP Pelvis and Lateral Right Hip X-RAYS done 

## 2013-10-26 NOTE — Anesthesia Preprocedure Evaluation (Addendum)
Anesthesia Evaluation    Airway Mallampati: II TM Distance: >3 FB Neck ROM: Full    Dental no notable dental hx. (+) Poor Dentition, Chipped, Loose and Missing   Pulmonary sleep apnea and Continuous Positive Airway Pressure Ventilation , former smoker,  breath sounds clear to auscultation  Pulmonary exam normal       Cardiovascular hypertension, + CAD + dysrhythmias Atrial Fibrillation Rhythm:Regular Rate:Normal     Neuro/Psych Anxiety Depression    GI/Hepatic   Endo/Other  Hypothyroidism Morbid obesity  Renal/GU      Musculoskeletal   Abdominal   Peds  Hematology  (+) anemia ,   Anesthesia Other Findings   Reproductive/Obstetrics                          Anesthesia Physical Anesthesia Plan  ASA: II  Anesthesia Plan: General   Post-op Pain Management:    Induction: Intravenous  Airway Management Planned: Oral ETT  Additional Equipment:   Intra-op Plan:   Post-operative Plan: Extubation in OR  Informed Consent: I have reviewed the patients History and Physical, chart, labs and discussed the procedure including the risks, benefits and alternatives for the proposed anesthesia with the patient or authorized representative who has indicated his/her understanding and acceptance.   Dental advisory given  Plan Discussed with: CRNA  Anesthesia Plan Comments:         Anesthesia Quick Evaluation                                  Anesthesia Evaluation  Patient identified by MRN, date of birth, ID band Patient awake    Reviewed: Allergy & Precautions, H&P , NPO status , Patient's Chart, lab work & pertinent test results  Airway Mallampati: II TM Distance: >3 FB Neck ROM: full    Dental  (+) Teeth Intact   Pulmonary neg pulmonary ROS, sleep apnea , former smoker,          Cardiovascular hypertension, Pt. on medications + CAD + dysrhythmias Atrial Fibrillation      Neuro/Psych Anxiety Depression negative neurological ROS  negative psych ROS   GI/Hepatic negative GI ROS, Neg liver ROS, GERD-  ,  Endo/Other  Hypothyroidism obese  Renal/GU negative Renal ROS  negative genitourinary   Musculoskeletal negative musculoskeletal ROS (+)   Abdominal   Peds negative pediatric ROS (+)  Hematology negative hematology ROS (+)   Anesthesia Other Findings   Reproductive/Obstetrics negative OB ROS                           Anesthesia Physical  Anesthesia Plan  ASA: III  Anesthesia Plan: General   Post-op Pain Management:    Induction: Intravenous  Airway Management Planned: Oral ETT  Additional Equipment:   Intra-op Plan:   Post-operative Plan: Extubation in OR  Informed Consent: I have reviewed the patients History and Physical, chart, labs and discussed the procedure including the risks, benefits and alternatives for the proposed anesthesia with the patient or authorized representative who has indicated his/her understanding and acceptance.   Dental advisory given  Plan Discussed with: CRNA, Anesthesiologist and Surgeon  Anesthesia Plan Comments:         Anesthesia Quick Evaluation  Anesthesia Evaluation  Patient identified by MRN, date of birth, ID band Patient awake    Reviewed: Allergy & Precautions, H&P , NPO status , Patient's Chart, lab work & pertinent test results  Airway Mallampati: II TM Distance: >3 FB Neck ROM: full    Dental  (+) Teeth Intact   Pulmonary sleep apnea , former smoker,          Cardiovascular hypertension, Pt. on medications + CAD + dysrhythmias Atrial Fibrillation     Neuro/Psych PSYCHIATRIC DISORDERS Anxiety Depression negative neurological ROS     GI/Hepatic Neg liver ROS, GERD-  Medicated,  Endo/Other  Hypothyroidism obese  Renal/GU negative Renal ROS     Musculoskeletal negative musculoskeletal  ROS (+)   Abdominal (+) + obese,   Peds  Hematology  (+) Blood dyscrasia, anemia ,   Anesthesia Other Findings   Reproductive/Obstetrics negative OB ROS                          Anesthesia Physical  Anesthesia Plan  ASA: III  Anesthesia Plan: General   Post-op Pain Management:    Induction: Intravenous  Airway Management Planned: LMA and Mask  Additional Equipment:   Intra-op Plan:   Post-operative Plan: Extubation in OR  Informed Consent: I have reviewed the patients History and Physical, chart, labs and discussed the procedure including the risks, benefits and alternatives for the proposed anesthesia with the patient or authorized representative who has indicated his/her understanding and acceptance.   Dental advisory given  Plan Discussed with: CRNA  Anesthesia Plan Comments:         Anesthesia Quick Evaluation

## 2013-10-26 NOTE — Progress Notes (Signed)
X-ray results called to Valentina Shaggy, PA for Dr. Alvan Dame.

## 2013-10-26 NOTE — Interval H&P Note (Signed)
History and Physical Interval Note:  10/26/2013 9:42 AM  [name]  has presented today for surgery, with the diagnosis of UNSTABLE RIGHT TOTAL HIP     The various methods of treatment have been discussed with the patient and family. After consideration of risks, benefits and other options for treatment, the patient has consented to  Procedure(s): REVISION RIGHT TOTAL HIP ARTHROPLASTY (Right) as a surgical intervention .  The patient's history has been reviewed, patient examined, no change in status, stable for surgery.  I have reviewed the patient's chart and labs.  Questions were answered to the patient's satisfaction.     [name]

## 2013-10-26 NOTE — Anesthesia Procedure Notes (Signed)
Procedure Name: Intubation Date/Time: 10/26/2013 11:01 AM Performed by: Danley Danker L Patient Re-evaluated:Patient Re-evaluated prior to inductionOxygen Delivery Method: Circle system utilized Preoxygenation: Pre-oxygenation with 100% oxygen Intubation Type: IV induction Ventilation: Mask ventilation without difficulty and Oral airway inserted - appropriate to patient size Laryngoscope Size: Miller and 3 Grade View: Grade I Tube type: Oral Tube size: 8.0 mm Number of attempts: 1 Airway Equipment and Method: Stylet Placement Confirmation: ETT inserted through vocal cords under direct vision,  breath sounds checked- equal and bilateral and positive ETCO2 Secured at: 21 cm Tube secured with: Tape Dental Injury: Teeth and Oropharynx as per pre-operative assessment

## 2013-10-26 NOTE — Preoperative (Signed)
Beta Blockers   Reason not to administer Beta Blockers:Not Applicable 

## 2013-10-26 NOTE — Progress Notes (Signed)
X-ray results noted 

## 2013-10-27 ENCOUNTER — Inpatient Hospital Stay (HOSPITAL_COMMUNITY): Admission: RE | Admit: 2013-10-27 | Payer: Medicare Other | Source: Ambulatory Visit | Admitting: Orthopedic Surgery

## 2013-10-27 ENCOUNTER — Encounter (HOSPITAL_COMMUNITY): Admission: RE | Payer: Self-pay | Source: Ambulatory Visit

## 2013-10-27 LAB — CBC
HCT: 26.3 % — ABNORMAL LOW (ref 39.0–52.0)
Hemoglobin: 8.4 g/dL — ABNORMAL LOW (ref 13.0–17.0)
MCH: 26.4 pg (ref 26.0–34.0)
MCHC: 31.9 g/dL (ref 30.0–36.0)
MCV: 82.7 fL (ref 78.0–100.0)
PLATELETS: 291 10*3/uL (ref 150–400)
RBC: 3.18 MIL/uL — AB (ref 4.22–5.81)
RDW: 18.8 % — ABNORMAL HIGH (ref 11.5–15.5)
WBC: 7.8 10*3/uL (ref 4.0–10.5)

## 2013-10-27 LAB — BASIC METABOLIC PANEL
BUN: 17 mg/dL (ref 6–23)
CO2: 25 meq/L (ref 19–32)
CREATININE: 0.96 mg/dL (ref 0.50–1.35)
Calcium: 7.5 mg/dL — ABNORMAL LOW (ref 8.4–10.5)
Chloride: 99 mEq/L (ref 96–112)
GFR calc Af Amer: >90 mL/min (ref >90)
GFR calc non Af Amer: 83 mL/min — ABNORMAL LOW (ref >90)
GLUCOSE: 134 mg/dL — AB (ref 70–99)
Potassium: 4.3 mEq/L (ref 3.7–5.3)
Sodium: 133 mEq/L — ABNORMAL LOW (ref 137–147)

## 2013-10-27 SURGERY — OPEN REDUCTION INTERNAL FIXATION (ORIF) WRIST FRACTURE
Anesthesia: General | Laterality: Left

## 2013-10-27 NOTE — Brief Op Note (Signed)
10/26/2013  10:55 AM  PATIENT:  [name]  68 y.o. male  PRE-OPERATIVE DIAGNOSIS:  UNSTABLE RIGHT TOTAL HIP, failed right total hip    POST-OPERATIVE DIAGNOSIS:  UNSTABLE RIGHT TOTAL HIP, failed right total hip, complicated post-traumatic condition of hip  PROCEDURE:  Procedure(s): REVISION RIGHT TOTAL HIP ARTHROPLASTY (Right)  SURGEON:  Surgeon(s) and Role:    * [name], MD - Primary  PHYSICIAN ASSISTANT: Danae Orleans, PA-C  ANESTHESIA:   general  EBL:   1800cc  BLOOD ADMINISTERED:3 units of PRBCs given through out case sue to pre-operative anema in addition to blood loss  DRAINS: none   LOCAL MEDICATIONS USED:  NONE  SPECIMEN:  No Specimen  DISPOSITION OF SPECIMEN:  N/A  COUNTS:  YES  TOURNIQUET:  * No tourniquets in log *  DICTATION: .Other Dictation: Dictation Number [id number]  PLAN OF CARE: Admit to inpatient   PATIENT DISPOSITION:  PACU - hemodynamically stable.   Delay start of Pharmacological VTE agent (>24hrs) due to surgical blood loss or risk of bleeding: no

## 2013-10-27 NOTE — Progress Notes (Signed)
Clinical Social Work Department BRIEF PSYCHOSOCIAL ASSESSMENT 10/27/2013  Patient:  [name], [name]     Account Number:  [id number]     Admit date:  10/26/2013  Clinical Social Worker:  Levie Heritage  Date/Time:  10/27/2013 10:46 AM  Referred by:  Physician  Date Referred:  10/27/2013 Referred for  SNF Placement   Other Referral:   q   Interview type:  Patient Other interview type:    PSYCHOSOCIAL DATA Living Status:  SIGNIFICANT OTHER Admitted from facility:   Level of care:   Primary support name:  [name] Primary support relationship to patient:  PARTNER Degree of support available:   strong    CURRENT CONCERNS Current Concerns  Post-Acute Placement   Other Concerns:    SOCIAL WORK ASSESSMENT / PLAN Met with Pt to discuss d/c plans.    Pt reported that he was at [facility] for an extended period of time up until 10/22/13.  Pt stated that, although the facility felt that he needed to remain at their facility, the insurance company felt that he had plateaued and he was d/c'd home.    Pt stated that he has fallen since being home and that he's requiring 2 different surgeried; leg and arm.  Pt wants to go back to [facility] upon d/c.  He has notified the facility of his intent to return.    CSW thanked Pt for his time.   Assessment/plan status:  Psychosocial Support/Ongoing Assessment of Needs Other assessment/ plan:   Information/referral to community resources:   n/a--Pt recently d/c'd from [facility] and has contacted them about returning.    PATIENT'S/FAMILY'S RESPONSE TO PLAN OF CARE: Pt was very upbeat and had a positive attitude regarding his current medical situation.  He is happy to return to [facility] and hope that he can get the "[employer]," as he has been in the same room 3 times.    He discussed with CSW how his medical issues have begun to affect his relationship with his  partner of 34 years.  Pt explained that, because of this, he and his partner have sought therapy from a provider [place].  Pt reported that he feels that things are getting better between the two of them and that, when Pt returns to SNF, he hopes that his partner with go on vacation and get some much needed rest.    CSW offered emotional support.    Pt thanked CSW for time and assistance.   Bernita Raisin, [place] Work [phone]

## 2013-10-27 NOTE — Op Note (Signed)
NAMECURTEZ, BRALLIER              ACCOUNT NO.:  [id number]  MEDICAL RECORD NO.:  [id number]  LOCATION:  6387                         FACILITY:  [facility]  PHYSICIAN:  Pietro Cassis. Alvan Dame, M.D.  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  PROCEDURE:  Revision right total hip arthroplasty.  COMPONENTS USED:  I comforted his acetabulum to a deep profile revision shell with a constrained liner to provide some enhanced stability with hope for bony ingrowth and decreased risk of failure down the road. Components used were a size 62 deep profile revision shell with a 36 +4, 10 degree constrained liner has a 56 mm in diameter, with a 36+ 12 metal ball.  SURGEON:  Pietro Cassis. Alvan Dame, M.D.  ASSISTANT:  Danae Orleans, PA.  Note that, Mr. [name] was present for the entirety of the case from preoperative position, perioperative management, operative extremity, general facilitation of the case, and primary wound closure.  ANESTHESIA:  General.  SPECIMENS:  None.  There were no signs of infection.  There was large seroma this material inside the hip, but no infection noted.  HEMOVAC DRAIN:  None.  BLOOD LOSS:  Approximately 800 mL.  He had received 3 units of blood due to a preoperative hemoglobin of 9.5 and perioperative blood loss.  COMPLICATIONS:  None apparent.  INDICATION FOR PROCEDURE:  Mr. Pop is a 68 year old male who is known to me from previous right hip fracture with attempted open reduction and  internal fixation and failed.  He was referred for surgical management.  Initial attempted surgery was complicated by ongoing issues with an ipsilateral wrist injury.  Initial surgery failed due to bony ingrowth with this prosthetic component.  He underwent revision surgery about a month ago.  At that time, we revised him to a cemented type version of the component to provide some stability, trying to maximize the length and soft tissues to his component.  He has had significant heterotopic bone and malunion of the proximal hip in addition to his soft tissues, most likely related to instability leading to this surgical indication.  Risks and benefits, the potentials for complications including infection, DVT, failure of ingrowth and need for future surgery all reviewed and discussed.  Consent was obtained for benefit of the hip management.  PROCEDURE IN DETAIL:  The patient was brought to operative theater. Once adequate anesthesia, preoperative antibiotics, Ancef 3 g administered.  He was positioned in the left lateral decubitus position with the right side up.  The right lower extremity was then prepped and draped in a sterile fashion.  Time-out was performed identifying the patient, planned procedure, and extremity.  The patient's old incision was excised, soft tissue dissection was carried down to the iliotibial band and gluteal fascia, encountering  seroma in the soft tissues, and then depth going deep to the iliotibial band.  Old hematoma was evacuated.  No signs of infection.  Upon entry into the hip joint, we found the hip joint was reduced.  However unstable most likely related to significant impingement and decrease soft tissue tension.  The femoral component was noted to be stable.  The hip was dislocated and the femoral head was removed.  The femur was then retracted anteriorly.  Significant portion of this case was debridement of the soft tissue and scarring predominantly  focused on the anterior aspect of the hip as well as using a 5-H curved osteotome to remove malunited bone of the proximal femur and trochanter which was anterior.  I also made a decision at this point to use a constrained liner on him. I wanted to try to enhance some of the soft tissue tension in the acetabulum, though I know that there is increased risk of early failure of acetabular component.  Given the fact that he unfortunately has broken his contralateral wrist and will be nonweightbearing on his left lower extremity for while, I felt that I would have the opportunity of the next 6-8 weeks to allow for adequate bony ingrowth of this acetabular shell preventing potential stress in this area.  I thus used explant system to remove his old acetabular shell without bone loss. reamed up just to 61 mm.  I then placed a 62 mm deep profile revision acetabular shell.  I was able to get 2 fairly decent screw fixation as well as 2 other screws placed to provide enhanced stability initially.  Please note that upon initial placement of the first constrained liner attempt, we had enough difficulties with liner that resulted in [place] having to remove that liner and replace it with this liner as previously noted.  The second liner was placed, and the constrained ring placed without difficulty or complication.  Once I reassessed for further impingement issues, I was satisfied with the overall procedure itself.  These findings will be reviewed with the patient as well as with his partner, [name].  Following and during the procedure, we irrigated the hip throughout the case with pulse lavage and normal saline solution.  I then reapproximated the iliotibial band and gluteal fascia with multiple interrupted figure-of-eight sutures.  I then reapproximated the subcutaneous layer of 2-0 Vicryl and staple on the skin.  The skin was cleaned, dried, and dressed sterilely using Xeroform and a bulky wrap and  a bulky dressing.  He was then brought to the recovery room in stable condition tolerating the procedure well.  I have discussed the case Dr. Laurelyn Sickle who is scheduled to operate on his left wrist, to perhaps do it on Sunday as opposed to on Saturday which he was initially planning.  Findings again reviewed with his partner.  I will review these with the patient as we go along.  He will be nonweightbearing on right lower extremity to prevent excessive stress and concern with this right hip.     Pietro Cassis Alvan Dame, M.D.     MDO/MEDQ  D:  10/27/2013  T:  10/27/2013  Job:  [id number]

## 2013-10-27 NOTE — Progress Notes (Signed)
PHYSICAL THERAPY NOTE- Per Valentina Shaggy , PA , pt is currently on bedrest. Pt is planned for L UE ORIF 1/18. Will f/u 1/19 for further directions from Dr. Alvan Dame about mobility. Tresa Endo PT [phone]

## 2013-10-27 NOTE — Progress Notes (Signed)
Patient ID: [name], male   DOB: [birth year], 68 y.o.   MRN: [id number] Patient seen and evaluated.  Patient is in good spirits he states. He is tolerating liquids and solids. The patient has Foley catheter in place.  I have loosened his splint on the left upper extremity. He remains intact to sensation and vascular exam.  Given the extensive surgery yesterday with Dr. Alvan Dame we are planning to hold on his ORIF plans/commitments for his left wrist until tomorrow. We will tentatively plan ORIF tomorrow if he is doing well and without problems.  Patient understands this and the findings.  His a.m. labs have been drawn we will await the results.  Doriann Zuch M.D.

## 2013-10-27 NOTE — Progress Notes (Signed)
OT Cancellation Note  Patient Details Name: [name] MRN: [id number] DOB: 05/25/1946   Cancelled Treatment:    Reason Eval/Treat Not Completed: Other (comment). Pt is NWB LE and will have surgery on wrist tomorrow. Will await post wrist surgery for eval with hopeful that pt can then use a PFRW?  Almon Register [phone] 10/27/2013, 9:50 AM

## 2013-10-27 NOTE — Progress Notes (Signed)
Clinical Social Work Department CLINICAL SOCIAL WORK PLACEMENT NOTE 10/27/2013  Patient:  [name], [name]  Account Number:  [id number] Admit date:  10/26/2013  Clinical Social Worker:  Levie Heritage  Date/time:  10/27/2013 10:53 AM  Clinical Social Work is seeking post-discharge placement for this patient at the following level of care:   SKILLED NURSING   (*CSW will update this form in Epic as items are completed)    declined Patient/family provided with [facility] Department of Clinical Social Work's list of facilities offering this level of care within the geographic area requested by the patient (or if unable, by the patient's family).  10/27/2013  Patient/family informed of their freedom to choose among providers that offer the needed level of care, that participate in Medicare, Medicaid or managed care program needed by the patient, have an available bed and are willing to accept the patient.  10/27/2013  Patient/family informed of [facility]' ownership interest in [facility], as well as of the fact that they are under no obligation to receive care at this facility.  PASARR submitted to [employer] on existing PASARR number received from [employer] on   FL2 transmitted to all facilities in geographic area requested by pt/family on  10/27/2013 FL2 transmitted to all facilities within larger geographic area on   Patient informed that his/her managed care company has contracts with or will negotiate with  certain facilities, including the following:     Patient/family informed of bed offers received:   Patient chooses bed at  Physician recommends and patient chooses bed at    Patient to be transferred to  on   Patient to be transferred to facility by   The following physician request were entered in Epic:   Additional Comments:  Bernita Raisin, [place] Work [phone]

## 2013-10-27 NOTE — Progress Notes (Signed)
   Subjective: 1 Day Post-Op Procedure(s) (LRB): REVISION RIGHT TOTAL HIP ARTHROPLASTY (Right)   Patient reports pain as mild, pain controlled in the right hip.  Some sharp pains in the left wrist from time to time. No events throughout the night.  Discussed activity and WB status,  he will be NWB through the left wrist after surgery and to protect the hip we will also make him NWB.  Objective:   VITALS:   Filed Vitals:   10/27/13  BP: 110/60  Pulse: 80  Temp: 97.7 F (36.5 C)   Resp: 20    Neurovascular intact Dorsiflexion/Plantar flexion intact Incision: dressing C/D/I No cellulitis present Compartment soft  LABS  Recent Labs  10/24/13 1135 10/27/13 0727  HGB 9.5* 8.4*  HCT 30.4* 26.3*  WBC 9.0 7.8  PLT 432* 291     Recent Labs  10/24/13 1135 10/27/13 0727  NA 136* 133*  K 4.4 4.3  BUN 17 17  CREATININE 0.97 0.96  GLUCOSE 106* 134*     Assessment/Plan: 1 Day Post-Op Procedure(s) (LRB): REVISION RIGHT TOTAL HIP ARTHROPLASTY (Right)  Advance diet NPO after MN, plan for surgery with Dr. Amedeo Plenty tomorrow Will recheck H&H in the morning and will receive blood if needed.   [place] Pugh Blake Vetrano   PAC  10/27/2013, 9:42 AM

## 2013-10-28 ENCOUNTER — Encounter (HOSPITAL_COMMUNITY)
Admission: RE | Disposition: A | Discharge status: Skilled Nursing Facility | Payer: Self-pay | Source: Ambulatory Visit | Attending: Orthopedic Surgery

## 2013-10-28 ENCOUNTER — Inpatient Hospital Stay (HOSPITAL_COMMUNITY): Payer: Medicare Other | Admitting: Anesthesiology

## 2013-10-28 ENCOUNTER — Encounter (HOSPITAL_COMMUNITY): Payer: Medicare Other | Admitting: Anesthesiology

## 2013-10-28 HISTORY — PX: ORIF WRIST FRACTURE: SHX2133

## 2013-10-28 LAB — CBC
HCT: 26.1 % — ABNORMAL LOW (ref 39.0–52.0)
Hemoglobin: 8.1 g/dL — ABNORMAL LOW (ref 13.0–17.0)
MCH: 26.2 pg (ref 26.0–34.0)
MCHC: 31 g/dL (ref 30.0–36.0)
MCV: 84.5 fL (ref 78.0–100.0)
PLATELETS: 300 10*3/uL (ref 150–400)
RBC: 3.09 MIL/uL — ABNORMAL LOW (ref 4.22–5.81)
RDW: 19.1 % — AB (ref 11.5–15.5)
WBC: 7.9 10*3/uL (ref 4.0–10.5)

## 2013-10-28 LAB — BASIC METABOLIC PANEL
BUN: 15 mg/dL (ref 6–23)
CALCIUM: 7.9 mg/dL — AB (ref 8.4–10.5)
CO2: 25 mEq/L (ref 19–32)
CREATININE: 0.86 mg/dL (ref 0.50–1.35)
Chloride: 99 mEq/L (ref 96–112)
GFR, EST NON AFRICAN AMERICAN: 87 mL/min — AB (ref >90)
Glucose, Bld: 131 mg/dL — ABNORMAL HIGH (ref 70–99)
Potassium: 4.4 mEq/L (ref 3.7–5.3)
Sodium: 135 mEq/L — ABNORMAL LOW (ref 137–147)

## 2013-10-28 SURGERY — OPEN REDUCTION INTERNAL FIXATION (ORIF) WRIST FRACTURE
Anesthesia: General | Site: Wrist | Laterality: Left

## 2013-10-28 MED ORDER — LIDOCAINE HCL (CARDIAC) 20 MG/ML IV SOLN
INTRAVENOUS | Status: DC | PRN
  Administered 2013-10-28: 30 mg via INTRAVENOUS

## 2013-10-28 MED ORDER — SUCCINYLCHOLINE CHLORIDE 20 MG/ML IJ SOLN
INTRAMUSCULAR | Status: DC | PRN
  Administered 2013-10-28: 160 mg via INTRAVENOUS

## 2013-10-28 MED ORDER — EPHEDRINE SULFATE 50 MG/ML IJ SOLN
INTRAMUSCULAR | Status: DC | PRN
  Administered 2013-10-28: 10 mg via INTRAVENOUS

## 2013-10-28 MED ORDER — HYDROMORPHONE HCL PF 1 MG/ML IJ SOLN
INTRAMUSCULAR | Status: AC
  Filled 2013-10-28: qty 1

## 2013-10-28 MED ORDER — BUPIVACAINE HCL (PF) 0.25 % IJ SOLN
INTRAMUSCULAR | Status: AC
  Filled 2013-10-28: qty 30

## 2013-10-28 MED ORDER — DEXTROSE 5 % IV SOLN
3.0000 g | INTRAVENOUS | Status: AC
  Filled 2013-10-28: qty 3000

## 2013-10-28 MED ORDER — PROPOFOL 10 MG/ML IV BOLUS
INTRAVENOUS | Status: DC | PRN
  Administered 2013-10-28: 200 mg via INTRAVENOUS

## 2013-10-28 MED ORDER — 0.9 % SODIUM CHLORIDE (POUR BTL) OPTIME
TOPICAL | Status: DC | PRN
  Administered 2013-10-28: 1000 mL

## 2013-10-28 MED ORDER — HYDROMORPHONE HCL PF 1 MG/ML IJ SOLN
0.2500 mg | INTRAMUSCULAR | Status: DC | PRN
  Administered 2013-10-28 (×4): 0.5 mg via INTRAVENOUS

## 2013-10-28 MED ORDER — CISATRACURIUM BESYLATE (PF) 10 MG/5ML IV SOLN
INTRAVENOUS | Status: DC | PRN
  Administered 2013-10-28: 7 mg via INTRAVENOUS

## 2013-10-28 MED ORDER — DEXAMETHASONE SODIUM PHOSPHATE 4 MG/ML IJ SOLN
INTRAMUSCULAR | Status: DC | PRN
  Administered 2013-10-28: 10 mg via INTRAVENOUS

## 2013-10-28 MED ORDER — ONDANSETRON HCL 4 MG/2ML IJ SOLN
INTRAMUSCULAR | Status: DC | PRN
  Administered 2013-10-28 (×2): 2 mg via INTRAVENOUS

## 2013-10-28 MED ORDER — CIPROFLOXACIN IN D5W 400 MG/200ML IV SOLN
400.0000 mg | Freq: Two times a day (BID) | INTRAVENOUS | Status: AC
  Administered 2013-10-28 – 2013-10-29 (×4): 400 mg via INTRAVENOUS
  Filled 2013-10-28 (×5): qty 200

## 2013-10-28 MED ORDER — GLYCOPYRROLATE 0.2 MG/ML IJ SOLN
INTRAMUSCULAR | Status: DC | PRN
  Administered 2013-10-28: 0.4 mg via INTRAVENOUS

## 2013-10-28 MED ORDER — BUPIVACAINE-EPINEPHRINE PF 0.5-1:200000 % IJ SOLN
INTRAMUSCULAR | Status: AC
  Filled 2013-10-28: qty 30

## 2013-10-28 MED ORDER — ACETAMINOPHEN 10 MG/ML IV SOLN
1000.0000 mg | Freq: Once | INTRAVENOUS | Status: AC
  Administered 2013-10-28: 1000 mg via INTRAVENOUS
  Filled 2013-10-28: qty 100

## 2013-10-28 MED ORDER — PHENYLEPHRINE HCL 10 MG/ML IJ SOLN
10.0000 mg | INTRAVENOUS | Status: DC | PRN
  Administered 2013-10-28: 50 ug/min via INTRAVENOUS

## 2013-10-28 MED ORDER — SODIUM CHLORIDE 0.9 % IV SOLN
INTRAVENOUS | Status: DC | PRN
  Administered 2013-10-28: 08:00:00 via INTRAVENOUS

## 2013-10-28 MED ORDER — FENTANYL CITRATE 0.05 MG/ML IJ SOLN
INTRAMUSCULAR | Status: DC | PRN
  Administered 2013-10-28: 25 ug via INTRAVENOUS
  Administered 2013-10-28: 100 ug via INTRAVENOUS
  Administered 2013-10-28: 50 ug via INTRAVENOUS
  Administered 2013-10-28 (×3): 25 ug via INTRAVENOUS

## 2013-10-28 MED ORDER — DEXTROSE 5 % IV SOLN
3.0000 g | INTRAVENOUS | Status: DC | PRN
  Administered 2013-10-28: 3 g via INTRAVENOUS

## 2013-10-28 MED ORDER — NEOSTIGMINE METHYLSULFATE 1 MG/ML IJ SOLN
INTRAMUSCULAR | Status: DC | PRN
  Administered 2013-10-28: 3 mg via INTRAVENOUS

## 2013-10-28 MED ORDER — KETAMINE HCL 10 MG/ML IJ SOLN
INTRAMUSCULAR | Status: DC | PRN
  Administered 2013-10-28: 25 mg via INTRAVENOUS

## 2013-10-28 MED ORDER — LACTATED RINGERS IV SOLN
INTRAVENOUS | Status: DC
  Administered 2013-10-28 (×2): via INTRAVENOUS

## 2013-10-28 SURGICAL SUPPLY — 80 items
BAG SPEC THK2 15X12 ZIP CLS (MISCELLANEOUS) ×1
BAG ZIPLOCK 12X15 (MISCELLANEOUS) ×3 IMPLANT
BANDAGE ELASTIC 4 VELCRO ST LF (GAUZE/BANDAGES/DRESSINGS) ×4 IMPLANT
BANDAGE GAUZE ELAST BULKY 4 IN (GAUZE/BANDAGES/DRESSINGS) ×4 IMPLANT
BIT DRILL 2.2 SS TIBIAL (BIT) ×4 IMPLANT
BNDG GAUZE ELAST 4 BULKY (GAUZE/BANDAGES/DRESSINGS) ×3 IMPLANT
CUFF TOURN SGL QUICK 18 (TOURNIQUET CUFF) ×3 IMPLANT
CUFF TOURN SGL QUICK 24 (TOURNIQUET CUFF) ×3
CUFF TRNQT CYL 24X4X40X1 (TOURNIQUET CUFF) IMPLANT
DECANTER SPIKE VIAL GLASS SM (MISCELLANEOUS) ×1 IMPLANT
DRAPE OEC MINIVIEW 54X84 (DRAPES) ×3 IMPLANT
DRAPE U-SHAPE 47X51 STRL (DRAPES) ×3 IMPLANT
DRSG ADAPTIC 3X8 NADH LF (GAUZE/BANDAGES/DRESSINGS) ×4 IMPLANT
ELECT REM PT RETURN 9FT ADLT (ELECTROSURGICAL) ×3
ELECTRODE REM PT RTRN 9FT ADLT (ELECTROSURGICAL) ×1 IMPLANT
EVACUATOR 1/8 PVC DRAIN (DRAIN) ×1 IMPLANT
GAUZE SPONGE 4X4 16PLY XRAY LF (GAUZE/BANDAGES/DRESSINGS) ×6 IMPLANT
GAUZE XEROFORM 5X9 LF (GAUZE/BANDAGES/DRESSINGS) ×4 IMPLANT
GLOVE BIO SURGEON STRL SZ8 (GLOVE) ×3 IMPLANT
GOWN STRL REUS W/TWL XL LVL3 (GOWN DISPOSABLE) ×3 IMPLANT
HOVERMATT SINGLE USE (MISCELLANEOUS) ×2 IMPLANT
K-WIRE 1.6 (WIRE) ×3
K-WIRE FX5X1.6XNS BN SS (WIRE) ×1
KIT BASIN OR (CUSTOM PROCEDURE TRAY) ×3 IMPLANT
KWIRE 4.0 X .045IN (WIRE) ×6 IMPLANT
KWIRE 4.0 X .062IN (WIRE) ×6 IMPLANT
KWIRE FX5X1.6XNS BN SS (WIRE) IMPLANT
MANIFOLD NEPTUNE II (INSTRUMENTS) ×3 IMPLANT
NS IRRIG 1000ML POUR BTL (IV SOLUTION) ×5 IMPLANT
PACK LOWER EXTREMITY WL (CUSTOM PROCEDURE TRAY) ×3 IMPLANT
PAD ABD 8X10 STRL (GAUZE/BANDAGES/DRESSINGS) ×3 IMPLANT
PAD CAST 3X4 CTTN HI CHSV (CAST SUPPLIES) ×1 IMPLANT
PAD CAST 4YDX4 CTTN HI CHSV (CAST SUPPLIES) ×1 IMPLANT
PADDING CAST ABS 4INX4YD NS (CAST SUPPLIES) ×8
PADDING CAST ABS 6INX4YD NS (CAST SUPPLIES) ×4
PADDING CAST ABS COTTON 4X4 ST (CAST SUPPLIES) IMPLANT
PADDING CAST ABS COTTON 6X4 NS (CAST SUPPLIES) IMPLANT
PADDING CAST COTTON 3X4 STRL (CAST SUPPLIES) ×3
PADDING CAST COTTON 4X4 STRL (CAST SUPPLIES) ×3
PADDING CAST COTTON 6X4 STRL (CAST SUPPLIES) ×2 IMPLANT
PEG LOCKING SMOOTH 2.2X20 (Screw) ×2 IMPLANT
PEG LOCKING SMOOTH 2.2X22 (Screw) ×2 IMPLANT
PEG LOCKING SMOOTH 2.2X24 (Peg) ×2 IMPLANT
PEG LOCKING SMOOTH 2.2X28 (Peg) ×2 IMPLANT
PLATE MEDIUM DVR LEFT (Plate) ×2 IMPLANT
POSITIONER SURGICAL ARM (MISCELLANEOUS) ×3 IMPLANT
PUTTY DBM STAGRAFT PLUS 5CC (Putty) ×2 IMPLANT
SCREW LOCK 16X2.7X 3 LD TPR (Screw) IMPLANT
SCREW LOCK 20X2.7X 3 LD TPR (Screw) IMPLANT
SCREW LOCK 24X2.7X3 LD THRD (Screw) IMPLANT
SCREW LOCK 26X2.7X 3 LD TPR (Screw) IMPLANT
SCREW LOCKING 2.7X15MM (Screw) ×6 IMPLANT
SCREW LOCKING 2.7X16 (Screw) ×12 IMPLANT
SCREW LOCKING 2.7X20MM (Screw) ×3 IMPLANT
SCREW LOCKING 2.7X24MM (Screw) ×3 IMPLANT
SCREW LOCKING 2.7X26MM (Screw) ×3 IMPLANT
SPLINT FIBERGLASS 4X15 (CAST SUPPLIES) ×2 IMPLANT
SPLINT FIBERGLASS 5X30 (CAST SUPPLIES) ×2 IMPLANT
SPONGE GAUZE 4X4 12PLY (GAUZE/BANDAGES/DRESSINGS) ×3 IMPLANT
STOCKINETTE TUBULAR SYNTH 3IN (CAST SUPPLIES) ×2 IMPLANT
SUT BONE WAX W31G (SUTURE) ×1 IMPLANT
SUT ETHILON 6 0 PS 3 18 (SUTURE) ×1 IMPLANT
SUT MERSILENE 4 0 P 3 (SUTURE) ×1 IMPLANT
SUT MNCRL AB 4-0 PS2 18 (SUTURE) ×1 IMPLANT
SUT PROLENE 3 0 PS 1 (SUTURE) ×4 IMPLANT
SUT PROLENE 3 0 PS 2 (SUTURE) ×5 IMPLANT
SUT PROLENE 4 0 P 3 18 (SUTURE) ×1 IMPLANT
SUT PROLENE 4 0 RB 1 (SUTURE) ×3
SUT PROLENE 4-0 RB1 .5 CRCL 36 (SUTURE) ×1 IMPLANT
SUT VIC AB 0 CT1 27 (SUTURE)
SUT VIC AB 0 CT1 27XBRD ANTBC (SUTURE) ×2 IMPLANT
SUT VIC AB 2-0 CT1 27 (SUTURE)
SUT VIC AB 2-0 CT1 TAPERPNT 27 (SUTURE) ×1 IMPLANT
SYSTEM CHEST DRAIN TLS 7FR (DRAIN) ×2 IMPLANT
TOWEL NATURAL 10PK STERILE (DISPOSABLE) ×2 IMPLANT
TOWEL OR 17X26 10 PK STRL BLUE (TOWEL DISPOSABLE) ×3 IMPLANT
TOWEL OR NON WOVEN STRL DISP B (DISPOSABLE) ×2 IMPLANT
TRAY PREP A LATEX SAFE STRL (SET/KITS/TRAYS/PACK) ×2 IMPLANT
UNDERPAD 30X30 INCONTINENT (UNDERPADS AND DIAPERS) ×5 IMPLANT
WATER STERILE IRR 1500ML POUR (IV SOLUTION) ×3 IMPLANT

## 2013-10-28 NOTE — Progress Notes (Signed)
Patient ID: [name], male   DOB: 13-Oct-1945, 68 y.o.   MRN: [id number] Subjective: POD#2 status post complex right hip revision tolerating course fine To OR today with gramig for ORIF left wrist    Patient reports pain as mild.  Objective:   VITALS:   Filed Vitals:   10/28/13 0722  BP: 131/67  Pulse: 77  Temp: 98.1 F (36.7 C)  Resp: 18    Neurovascular intact Incision: dressing C/D/I  LABS  Recent Labs  10/27/13 0727 10/28/13 0557  HGB 8.4* 8.1*  HCT 26.3* 26.1*  WBC 7.8 7.9  PLT 291 300     Recent Labs  10/27/13 0727 10/28/13 0557  NA 133* 135*  K 4.3 4.4  BUN 17 15  CREATININE 0.96 0.86  GLUCOSE 134* 131*    No results found for this basename: LABPT, INR,  in the last 72 hours   Assessment/Plan: Day of Surgery Procedure(s) (LRB): OPEN REDUCTION INTERNAL FIXATION (ORIF) WRIST FRACTURE with allograft (Left)   {Plan: Continue course Based on right hip surgery and left wrist fracture he unfortunately is going to need to be non-weight bearing until cleared by [facility] to bear weight through wrist or at least elbow  Will continue to follow Will need to return to SNF at least short term more than likely due to restrictions

## 2013-10-28 NOTE — H&P (Signed)
[name] is an 68 y.o. male.   Chief Complaint: broke my wrist HPI: 68 yo male, well known to our practice, presents with left distal radius fracture. The patient has Hx of multiple Ortho procedures secondary to metabolic bone disease. Most recently he underwent  Total right hip arthroplasty, 10/26/13 revision per Dr. Alvan Dame. Patient in addition sustained a left distal radius fracture. Given the acute blood loss after his hip procedure, we had decided to delay his ORIF left wrist until today. Patient is in good spirits . Pain is reasonably controlled. Hgb 8.1 today from 9.5 at admit.  Past Medical History  Diagnosis Date  . Acid reflux   . Thyroid disease     HX GRAVES DISEASE  . Low testosterone, possible hypogonadism 10/01/2012  . Lumbago   . High cholesterol   . Anxiety   . Depression   . CAD (coronary artery disease)   . Arthritis   . Environmental allergies   . Vertebral compression fracture     L1-wears brace  . Sleep apnea     cpap machine-settings 17  . Transfusion history     s/p 12'13 hip surgery  . A-fib     hx.- sinus rhythm now  . Fracture of wrist 10/23/13    LEFT  . Osteoporosis   . Metabolic bone disease   . Plaque psoriasis     Past Surgical History  Procedure Laterality Date  . Knee surgery Bilateral 2012    Total knee replacements  . Appendectomy    . Right femur surgery  05/2012  . Orif wrist fracture  10/02/2012    Procedure: OPEN REDUCTION INTERNAL FIXATION (ORIF) WRIST FRACTURE;  Surgeon: Roseanne Kaufman, MD;  Location: [facility];  Service: Orthopedics;  Laterality: Right;  WITH   ANTIBIOTIC  CEMENT  . Total hip revision  10/05/2012    Procedure: TOTAL HIP REVISION;  Surgeon: [name], MD;  Location: [facility];  Service: Orthopedics;  Laterality: Right;  RIGHT TOTAL HIP REVISION  . Hardware removal  10/05/2012    Procedure: HARDWARE REMOVAL;  Surgeon: [name], MD;  Location: [facility];  Service: Orthopedics;  Laterality: Right;  REMOVING   STRYKER  GAMMA NAIL  . Wrist fracture surgery  05/2012  . Orif tibia fracture Right 02/06/2013    Procedure: OPEN REDUCTION INTERNAL FIXATION (ORIF) TIBIA FRACTURE WITH IM ROD FIBULA;  Surgeon: Rozanna Box, MD;  Location: [facility];  Service: Orthopedics;  Laterality: Right;  . Cardiac catheterization  2013  . Orif tibia fracture Right 07/03/2013    Procedure: RIGHT TIBIA NON UNION REPAIR ;  Surgeon: Rozanna Box, MD;  Location: [facility];  Service: Orthopedics;  Laterality: Right;  . Hardware removal Right 07/03/2013    Procedure: HARDWARE REMOVAL RIGHT TIBIA ;  Surgeon: Rozanna Box, MD;  Location: [place];  Service: Orthopedics;  Laterality: Right;  . Joint replacement      hip-right x2  . Tonsillectomy    . Total hip revision Right 09/17/2013    Procedure: REVISION RIGHT TOTAL HIP ARTHROPLASTY ;  Surgeon: [name], MD;  Location: [facility];  Service: Orthopedics;  Laterality: Right;  . Hip closed reduction Right 10/15/2013    Procedure: CLOSED MANIPULATION HIP;  Surgeon: [name], MD;  Location: [facility];  Service: Orthopedics;  Laterality: Right;    Family History  Problem Relation Age of Onset  . CAD Father 46  . Arthritis Mother    Social History:  reports that he quit smoking about 16 months ago. He has never used smokeless tobacco. He reports that he drinks alcohol. He reports that he does not use illicit drugs.  Allergies: No Known Allergies  Medications Prior to Admission  Medication Sig Dispense Refill  . amiodarone (PACERONE) 100 MG tablet Take 100 mg by mouth daily.      [name] atorvastatin (LIPITOR) 80 MG tablet Take 80 mg by mouth every evening.       . clonazePAM (KLONOPIN) 1 MG tablet Take one tablet by mouth three times daily  90 tablet  5  . diltiazem (CARDIZEM CD) 240 MG 24 hr capsule Take 240 mg by mouth every morning.       [name] doxycycline (VIBRA-TABS) 100 MG tablet Take 100 mg by mouth daily.      . DULoxetine (CYMBALTA) 60 MG capsule Take 60 mg by mouth 2 (two)  times daily.      Scarlette Shorts SURECLICK 50 MG/ML injection Inject 50 mg as directed once a week. Last  Dose last dose was 10/19/2013      . HYDROcodone-acetaminophen (NORCO) 7.5-325 MG per tablet Take 1 tablet by mouth every 4 (four) hours as needed for moderate pain.      [name] levothyroxine (SYNTHROID, LEVOTHROID) 125 MCG tablet Take 250 mcg by mouth daily before breakfast.      . methocarbamol (ROBAXIN) 500 MG tablet Take 500 mg by mouth every 6 (six) hours as needed for muscle spasms.      [name] omeprazole (PRILOSEC) 20 MG capsule Take 20 mg by mouth daily.      . Polyethylene Glycol 400 (BLINK TEARS) 0.25 % SOLN Apply 1 drop to eye 2 (two) times daily as needed (dry eyes).      . ranolazine (RANEXA) 500 MG 12 hr tablet Take 500 mg by mouth 2 (two) times daily.      . Rivaroxaban (XARELTO) 20 MG TABS Take 20 mg by mouth daily with supper.      . Tamsulosin HCl (FLOMAX) 0.4 MG CAPS Take 0.4 mg by mouth every evening.       . Teriparatide, Recombinant, (FORTEO [place]) Inject 20 mg into the skin daily.       . Ascorbic Acid (VITAMIN C) 1000 MG tablet Take 1,000 mg by mouth daily.      . Cholecalciferol (VITAMIN D3) 5000 UNITS CAPS Take 5,000 Units by mouth daily.      . ferrous sulfate 325 (65 FE) MG tablet Take 325 mg by mouth 2 (two) times daily with a meal.      . loratadine (CLARITIN) 10 MG tablet Take 10 mg by mouth daily.      . Multiple Vitamin (MULTIVITAMIN WITH MINERALS) TABS tablet Take 1 tablet by mouth daily.      . nitroGLYCERIN (NITROSTAT) 0.4 MG SL tablet Place 0.4 mg under the tongue every 5 (five) minutes as needed for chest pain. x3 doses as needed for chest pain      . Oyster Shell (OYSTER CALCIUM) 500 MG TABS tablet Take 500 mg of elemental calcium by mouth 2 (two) times daily.      . polyethylene glycol (MIRALAX / GLYCOLAX) packet Take 17 g by mouth daily as needed. Constipation.      . Probiotic Product (PROBIOTIC PO) Take 1 tablet by mouth every morning.       . testosterone cypionate  (DEPOTESTOTERONE CYPIONATE) 200 MG/ML injection Inject 1 mL (200 mg total) into the muscle every 14 (fourteen)  days.  10 mL  5    Results for orders placed during the hospital encounter of 10/26/13 (from the past 48 hour(s))  PREPARE RBC (CROSSMATCH)     Status: None   Collection Time    10/26/13  1:05 PM      Result Value Range   Order Confirmation ORDER PROCESSED BY BLOOD BANK    CBC     Status: Abnormal   Collection Time    10/27/13  7:27 AM      Result Value Range   WBC 7.8  4.0 - 10.5 K/uL   RBC 3.18 (*) 4.22 - 5.81 MIL/uL   Hemoglobin 8.4 (*) 13.0 - 17.0 g/dL   HCT 26.3 (*) 39.0 - 52.0 %   MCV 82.7  78.0 - 100.0 fL   MCH 26.4  26.0 - 34.0 pg   MCHC 31.9  30.0 - 36.0 g/dL   RDW 18.8 (*) 11.5 - 15.5 %   Platelets 291  150 - 400 K/uL  BASIC METABOLIC PANEL     Status: Abnormal   Collection Time    10/27/13  7:27 AM      Result Value Range   Sodium 133 (*) 137 - 147 mEq/L   Potassium 4.3  3.7 - 5.3 mEq/L   Chloride 99  96 - 112 mEq/L   CO2 25  19 - 32 mEq/L   Glucose, Bld 134 (*) 70 - 99 mg/dL   BUN 17  6 - 23 mg/dL   Creatinine, Ser 0.96  0.50 - 1.35 mg/dL   Calcium 7.5 (*) 8.4 - 10.5 mg/dL   GFR calc non Af Amer 83 (*) >90 mL/min   GFR calc Af Amer >90  >90 mL/min   Comment: (NOTE)     The eGFR has been calculated using the CKD EPI equation.     This calculation has not been validated in all clinical situations.     eGFR's persistently <90 mL/min signify possible Chronic Kidney     Disease.  CBC     Status: Abnormal   Collection Time    10/28/13  5:57 AM      Result Value Range   WBC 7.9  4.0 - 10.5 K/uL   RBC 3.09 (*) 4.22 - 5.81 MIL/uL   Hemoglobin 8.1 (*) 13.0 - 17.0 g/dL   HCT 26.1 (*) 39.0 - 52.0 %   MCV 84.5  78.0 - 100.0 fL   MCH 26.2  26.0 - 34.0 pg   MCHC 31.0  30.0 - 36.0 g/dL   RDW 19.1 (*) 11.5 - 15.5 %   Platelets 300  150 - 400 K/uL  BASIC METABOLIC PANEL     Status: Abnormal   Collection Time    10/28/13  5:57 AM      Result Value Range    Sodium 135 (*) 137 - 147 mEq/L   Potassium 4.4  3.7 - 5.3 mEq/L   Chloride 99  96 - 112 mEq/L   CO2 25  19 - 32 mEq/L   Glucose, Bld 131 (*) 70 - 99 mg/dL   BUN 15  6 - 23 mg/dL   Creatinine, Ser 0.86  0.50 - 1.35 mg/dL   Calcium 7.9 (*) 8.4 - 10.5 mg/dL   GFR calc non Af Amer 87 (*) >90 mL/min   GFR calc Af Amer >90  >90 mL/min   Comment: (NOTE)     The eGFR has been calculated using the CKD EPI equation.  This calculation has not been validated in all clinical situations.     eGFR's persistently <90 mL/min signify possible Chronic Kidney     Disease.   Dg Pelvis Portable  10/26/2013   CLINICAL DATA:  Postoperative from revision of right hip arthroplasty  EXAM: PORTABLE PELVIS 1-2 VIEWS  COMPARISON:  Portable pelvis film of September 17, 2013.  FINDINGS: The patient has undergone revision of right hip arthroplasty. Radiographic positioning of the prosthetic components is good. The observed portions of the native bony pelvis exhibit no acute abnormalities. Again demonstrated is lucency along the lateral aspect of the superior pubic ramus on the right consistent with the previously described nondisplaced fracture. Healing is not complete.  IMPRESSION: The patient has undergone revision of right total hip arthroplasty. No acute radiographic abnormality is demonstrated.   Electronically Signed   By: David  Martinique   On: 10/26/2013 16:24   Dg Hip Portable 1 View Right  10/26/2013   CLINICAL DATA:  Post hip replacement  EXAM: PORTABLE RIGHT HIP - 1 VIEW  COMPARISON:  Portable exam 6834 hr compared to AP radiograph of 10/26/2013, 10/15/2013, and 09/17/2013  FINDINGS: Acetabular and femoral components of a right hip prosthesis are identified.  Cerclage wires present at the proximal right femur.  The 2 most proximal cerclage wires appear this contiguous.  No gross evidence of fracture dislocation.  IMPRESSION: Right hip prosthesis without acute bony abnormality or dislocation.  The 2 most proximal  cerclage wires at the proximal right femur appear discontiguous, unchanged.   Electronically Signed   By: Lavonia Dana M.D.   On: 10/26/2013 16:23    Review of Systems  Constitutional: Positive for malaise/fatigue.  HENT: Negative.   Eyes: Negative.   Respiratory: Negative.   Cardiovascular: Negative.   Gastrointestinal: Negative.   Musculoskeletal:       See hpi  Skin: Negative.     Blood pressure 131/67, pulse 77, temperature 98.1 F (36.7 C), temperature source Oral, resp. rate 18, height _0  (1.93 m), weight 139.254 kg (307 lb), SpO2 95.00%. Physical Exam  Pleasant, NAD Heent; atraumatic Chest: equal expansions noted Abd: nt LUE: No signs of comp. Syndrome, NV Intact Rom intact, sensation intact Splint applied  Assessment/Plan Left Distal radius fracture .Marland KitchenWe are planning surgery for your upper extremity. The risk and benefits of surgery include risk of bleeding infection anesthesia damage to normal structures and failure of the surgery to accomplish its intended goals of relieving symptoms and restoring function with this in mind we'll going to proceed. I have specifically discussed with the patient the pre-and postoperative regime and the does and don'ts and risk and benefits in great detail. Risk and benefits of surgery also include risk of dystrophy chronic nerve pain failure of the healing process to go onto completion and other inherent risks of surgery The relavent the pathophysiology of the disease/injury process, as well as the alternatives for treatment and postoperative course of action has been discussed in great detail with the patient who desires to proceed.  We will do everything in our power to help you (the patient) restore function to the upper extremity. Is a pleasure to see this patient today. Patient Active Problem List   Diagnosis Date Noted  . Expected blood loss anemia 09/18/2013  . Hyponatremia 09/18/2013  . S/P right TH revision 09/17/2013  .  Nonunion, fracture, Right tibia  07/03/2013  . UTI (urinary tract infection) 07/03/2013  . Pathological fracture due to osteoporosis with delayed healing 04/24/2013  .  Osteoporosis with fracture 04/24/2013  . Rash and nonspecific skin eruption 03/08/2013  . Syncope 02/21/2013  . PAF (paroxysmal atrial fibrillation) 02/21/2013  . Chronic anticoagulation 02/21/2013  . Fall 02/08/2013  . Physical deconditioning 02/08/2013  . Low back pain radiating to both legs 02/08/2013  . Compression fracture of L1 lumbar vertebra 02/08/2013  . Right tibial fracture 02/05/2013  . Obesity, unspecified 02/05/2013  . Osteomyelitis of right wrist 11/02/2012  . Anemia 10/06/2012  . HTN (hypertension) 10/03/2012  . Psoriasis 10/03/2012  . Dyslipidemia 10/03/2012  . Vitamin D deficiency 10/03/2012  . GERD (gastroesophageal reflux disease) 10/03/2012  . Hyperglycemia 10/03/2012  . Anxiety 10/03/2012  . Depression 10/03/2012  . CAD (coronary artery disease) 10/03/2012  . DJD (degenerative joint disease) 10/03/2012  . Chronic gingivitis 10/03/2012  . Low testosterone, possible hypogonadism 10/01/2012  . Normocytic anemia 09/30/2012  . Right femoral fracture 09/29/2012  . Right forearm fracture 09/29/2012  . Atrial fibrillation 09/29/2012  . Hypothyroidism 09/29/2012  . History of recurrent UTIs 09/29/2012    Aldwin Micalizzi L 10/28/2013, 7:36 AM

## 2013-10-28 NOTE — Transfer of Care (Signed)
Immediate Anesthesia Transfer of Care Note  Patient: [name]  Procedure(s) Performed: Procedure(s) with comments: OPEN REDUCTION INTERNAL FIXATION (ORIF) WRIST FRACTURE with allograft (Left) - DVR Plate  Patient Location: PACU  Anesthesia Type:General  Level of Consciousness: awake, alert  and patient cooperative  Airway & Oxygen Therapy: Patient Spontanous Breathing and Patient connected to face mask oxygen  Post-op Assessment: Report given to PACU RN and Post -op Vital signs reviewed and stable  Post vital signs: stable  Complications: No apparent anesthesia complications

## 2013-10-28 NOTE — Op Note (Signed)
NAMEEDUARD, PENKALA              ACCOUNT NO.:  [id number]  MEDICAL RECORD NO.:  [id number]  LOCATION:  3532                         FACILITY:  [facility]  PHYSICIAN:  Satira Anis. [name], M.D.DATE OF BIRTH:  11/28/1945  DATE OF PROCEDURE: DATE OF DISCHARGE:                              OPERATIVE REPORT   PREOPERATIVE DIAGNOSES:  Comminuted complex greater than 5 part distal radius fracture with associated ulnar styloid fracture.  POSTOPERATIVE DIAGNOSES:  Comminuted complex greater than 5 part distal radius fracture with associated ulnar styloid fracture.  PROCEDURE: 1. Open reduction and internal fixation with a Biomet DVR plate, left     distal radius fracture, greater than 5 part with allograft bone     grafting utilizing the Biomet bone graft substitute. 2. Stress radiography. 3. Sliding brachioradialis tenotomy. 4. Evaluation under anesthesia, distal radioulnar joint. 5. Closed treatment of ulnar styloid fracture, treated with     manipulation.  SURGEON:  Satira Anis. Amedeo Plenty, MD.  ASSISTANT:  Avelina Laine, PA-C.  COMPLICATIONS:  None.  ANESTHESIA:  General.  TOURNIQUET TIME:  Less than an hour.  DRAINS:  One.  INDICATIONS:  Mr. Heide is a pleasant male who fell, broke his wrist and has a plethora of other orthopedic maladies.  The patient has had a major reconstruction of his right upper extremity after initial failed ORIF attempts in the past.  The patient was seen in [place], [place] and subsequently referred to [facility] (myself and Dr. Alvan Dame) for evaluation of his hip and wrist.  I have previously presided over right wrist reconstructive efforts in a salvage nature with spanning wrist plate and he has actually done quite well.  His most recent fall resulted in a left wrist fracture.  He is currently on bone building medicine, but we do have some significant concerns about his ability to heal given his tibia fracture which Dr. Marcelino Scot has treated and his  right hip predicament which still continues to be an ongoing challenge at times.  He is 48 hours status post revision of his right hip.  The patient is scheduled for surgical intervention for his wrist today with the understanding that he will continue a nonweightbearing approach to the right leg and left arm.  DESCRIPTION OF PROCEDURE:  __________ by myself and Anesthesia, arm was marked, taken to the surgical suite and underwent a general anesthetic. He was very carefully protected during the transfers due to the recent hip surgery on the right lower extremity.  He was carefully padded and underwent thorough prep and drape and Betadine scrub and paint.  Time- out was called.  Tourniquet was insufflated to 250 mmHg and under sterile field after Betadine scrub and paint, curvilinear volar radial incision was made.  Dissection was carried down, FCR tendon sheath was incised palmarly and dorsally.  Following this, a fasciotomy was accomplished followed by sweeping the carpal canal contents ulnarly and accessing the fracture, he had a large spike and significant comminuted fracture fragments.  I very carefully teased the pronator off the radius and once this was accomplished, went about moving toward with bone grafting.  He had aggressive bone grafting of the dorsal __________ defect without difficulty.  Following this, he  was reduced, provisional fixation held and a DVR plate was applied without difficulty.  I was able to achieve adequate radial height, inclination, volar tilt.  His fossa, lateral view looked great.  Following this, we irrigated with a liter of fluid, placed additional bone grafts, and he had a nice tight pronator closure with 3-0 Vicryl. Following this, we irrigated again followed by placement of a TLS drain and closure of the skin edge with Prolene.  I should note, he underwent a sliding brachioradialis tenotomy to lessen deforming forces.  The sliding tenotomy was  done with Freer knife blade.  I should also note that we then turned our attention towards the distal radioulnar joint, this was evaluated under live fluoro and per palpatory exam.  This was rendered stable in my opinion and thus I chose manipulation and closed treatment of the ulnar styloid fracture.  Final x-rays were taken.  Following this, the patient had the area dressed with Adaptic, Xeroform, and a sugar-tong splint.  He tolerated the procedure well.  He will be monitored closely in the recovery room.  He will be nonweightbearing, right leg and left arm.  Should any problems arise, we will be immediately available for Mr. Bertoni and we will be watching him closely in the postop.  These notes have been discussed and all questions have been encouraged and answered.     Satira Anis. Amedeo Plenty, M.D.     [facility]  D:  10/28/2013  T:  10/28/2013  Job:  [id number]

## 2013-10-28 NOTE — Op Note (Signed)
See Dictation #[id number] Amedeo Plenty Md

## 2013-10-28 NOTE — Anesthesia Postprocedure Evaluation (Signed)
  Anesthesia Post-op Note  Patient: [name]  Procedure(s) Performed: Procedure(s) (LRB): OPEN REDUCTION INTERNAL FIXATION (ORIF) WRIST FRACTURE with allograft (Left)  Patient Location: PACU  Anesthesia Type: General  Level of Consciousness: awake and alert   Airway and Oxygen Therapy: Patient Spontanous Breathing  Post-op Pain: mild  Post-op Assessment: Post-op Vital signs reviewed, Patient's Cardiovascular Status Stable, Respiratory Function Stable, Patent Airway and No signs of Nausea or vomiting  Last Vitals:  Filed Vitals:   10/28/13 1205  BP:   Pulse: 78  Temp:   Resp: 13    Post-op Vital Signs: stable   Complications: No apparent anesthesia complications

## 2013-10-28 NOTE — Anesthesia Preprocedure Evaluation (Addendum)
Anesthesia Evaluation  Patient identified by MRN, date of birth, ID band Patient awake    Reviewed: Allergy & Precautions, H&P , NPO status , Patient's Chart, lab work & pertinent test results  Airway Mallampati: II TM Distance: >3 FB Neck ROM: full    Dental no notable dental hx. (+) Dental Advisory Given and Poor Dentition Loose, chipped, and missing:   Pulmonary sleep apnea and Continuous Positive Airway Pressure Ventilation , former smoker,  breath sounds clear to auscultation  Pulmonary exam normal       Cardiovascular Exercise Tolerance: Poor hypertension, Pt. on medications + CAD + dysrhythmias Atrial Fibrillation Rhythm:regular Rate:Normal  ECG AF   Neuro/Psych Anxiety Depression negative neurological ROS  negative psych ROS   GI/Hepatic negative GI ROS, Neg liver ROS, GERD-  ,  Endo/Other  Hypothyroidism Morbid obesityobese  Renal/GU negative Renal ROS  negative genitourinary   Musculoskeletal negative musculoskeletal ROS (+)   Abdominal (+) + obese,   Peds negative pediatric ROS (+)  Hematology negative hematology ROS (+) anemia , hgb 8.1.  One unit available   Anesthesia Other Findings   Reproductive/Obstetrics negative OB ROS                         Anesthesia Physical Anesthesia Plan  ASA: III  Anesthesia Plan: General   Post-op Pain Management:    Induction: Intravenous  Airway Management Planned: Oral ETT  Additional Equipment:   Intra-op Plan:   Post-operative Plan: Extubation in OR  Informed Consent: I have reviewed the patients History and Physical, chart, labs and discussed the procedure including the risks, benefits and alternatives for the proposed anesthesia with the patient or authorized representative who has indicated his/her understanding and acceptance.   Dental Advisory Given  Plan Discussed with: CRNA and Surgeon  Anesthesia Plan Comments:          Anesthesia Quick Evaluation

## 2013-10-29 ENCOUNTER — Encounter (HOSPITAL_COMMUNITY): Payer: Self-pay | Admitting: Orthopedic Surgery

## 2013-10-29 LAB — POCT I-STAT 4, (NA,K, GLUC, HGB,HCT)
GLUCOSE: 125 mg/dL — AB (ref 70–99)
HCT: 30 % — ABNORMAL LOW (ref 39.0–52.0)
HEMOGLOBIN: 10.2 g/dL — AB (ref 13.0–17.0)
POTASSIUM: 5.1 meq/L (ref 3.7–5.3)
Sodium: 133 mEq/L — ABNORMAL LOW (ref 137–147)

## 2013-10-29 NOTE — Discharge Instructions (Signed)
Non weight bearing right lower extremity Limit active utilization of his right leg  Active assist, passive movement of right leg with assistance to protect stress on hip joint  Keep wound dry until follow up

## 2013-10-29 NOTE — Plan of Care (Signed)
Problem: Phase II Progression Outcomes Goal: Ambulates Outcome: Not Progressing Pt is not weightbearing bilateral legs and left wrist.

## 2013-10-29 NOTE — Evaluation (Signed)
Occupational Therapy Evaluation Patient Details Name: [name] MRN: [id number] DOB: [birth year] Today's Date: 10/29/2013 Time: 5361-4431 OT Time Calculation (min): 31 min  OT Assessment / Plan / Recommendation History of present illness R THA revision,  L wrist ORIF due to fx.  PMH:  multiple fxs due to metabolic bone disease including L 1 compression fx with back brace.  Pt has posterior hip precautions, limited active use of R LE per Dr. Alvan Dame with recommendation of hoyer lift transfers.  Pt is NWB on L wrist and hand.   Clinical Impression   Pt with limited mobility following above surgeries renders him dependent in all mobility, bathing, dressing and toileting.  Pt will need SNF level care upon d/c.  Will defer OT to SNF.  Pt able to verbalize and demonstrate AROM of L shoulder and fingers as well a edema management techniques.      OT Assessment  All further OT needs can be met in the next venue of care    Follow Up Recommendations  SNF    Barriers to Discharge      Equipment Recommendations  None recommended by OT    Recommendations for Other Services    Frequency       Precautions / Restrictions Precautions Precautions: Back;Posterior Hip;Fall Precaution Comments: pt is able to state hip precautions and limitations with use of L UE Required Braces or Orthoses: Spinal Brace Restrictions Weight Bearing Restrictions: Yes LUE Weight Bearing: Non weight bearing (wrist and hand) RLE Weight Bearing: Non weight bearing   Pertinent Vitals/Pain Premedicated, no c/o pain, VSS    ADL  Eating/Feeding: Set up (with R hand) Where Assessed - Eating/Feeding: Bed level Grooming: Wash/dry hands;Wash/dry face;Brushing hair;Set up (with R hand) Where Assessed - Grooming: Supine, head of bed up Upper Body Bathing: +1 Total assistance Where Assessed - Upper Body Bathing: Supine, head of bed up;Rolling right and/or left Lower Body Bathing: +1 Total assistance Where Assessed - Lower  Body Bathing: Supine, head of bed up;Rolling right and/or left Upper Body Dressing: Moderate assistance Where Assessed - Upper Body Dressing: Supine, head of bed up Lower Body Dressing: +1 Total assistance Where Assessed - Lower Body Dressing: Supine, head of bed up;Rolling right and/or left ADL Comments: Instructed pt in edema management, positioning L UE, and pt performed AROM of shoulder and fingers x 10 each.    OT Diagnosis: Generalized weakness;Acute pain  OT Problem List: Decreased strength;Decreased activity tolerance;Impaired balance (sitting and/or standing);Obesity;Pain;Impaired UE functional use;Decreased range of motion OT Treatment Interventions:     OT Goals(Current goals can be found in the care plan section) Acute Rehab OT Goals Patient Stated Goal: Rehab at [facility].  Visit Information  Last OT Received On: 10/29/13 Assistance Needed: +2 History of Present Illness: R THA revision,  L wrist ORIF due to fx.  PMH:  multiple fxs due to metabolic bone disease including L 1 compression fx with back brace.  Pt has posterior hip precautions, limited active use of R LE per Dr. Alvan Dame with recommendation of hoyer lift transfers.  Pt is NWB on L wrist and hand.       Prior Functioning     Home Living Family/patient expects to be discharged to:: Skilled nursing facility Additional Comments: pt expects to go back to [facility] Communication Communication: No difficulties Dominant Hand: Left         Vision/Perception Vision - History Patient Visual Report: No change from baseline   Cognition  Cognition Arousal/Alertness: Awake/alert  Behavior During Therapy: WFL for tasks assessed/performed Overall Cognitive Status: Within Functional Limits for tasks assessed    Extremity/Trunk Assessment Upper Extremity Assessment Upper Extremity Assessment: LUE deficits/detail LUE Deficits / Details: splinted proximal of elbow to MPs, full shoulder and finger AROM LUE:  Unable to fully assess due to immobilization Lower Extremity Assessment Lower Extremity Assessment: Defer to PT evaluation     Mobility       Exercise     Balance     End of Session OT - End of Session Activity Tolerance: Patient tolerated treatment well Patient left: in bed;with call bell/phone within reach;with family/visitor present  GO     [place], Julie Lynn 10/29/2013, 2:11 PM [phone]  

## 2013-10-29 NOTE — Anesthesia Postprocedure Evaluation (Deleted)
  Anesthesia Post-op Note  Patient: [name]  Procedure(s) Performed: Procedure(s) (LRB): REVISION RIGHT TOTAL HIP ARTHROPLASTY (Right)  Patient Location: PACU  Anesthesia Type: General  Level of Consciousness: awake and alert   Airway and Oxygen Therapy: Patient Spontanous Breathing  Post-op Pain: mild  Post-op Assessment: Post-op Vital signs reviewed, Patient's Cardiovascular Status Stable, Respiratory Function Stable, Patent Airway and No signs of Nausea or vomiting  Last Vitals:  Filed Vitals:   10/29/13 0557  BP: 145/57  Pulse: 83  Temp: 36.7 C  Resp: 18    Post-op Vital Signs: stable   Complications: No apparent anesthesia complications

## 2013-10-29 NOTE — Progress Notes (Signed)
Subjective: 1 Day Post-Op Procedure(s) (LRB): OPEN REDUCTION INTERNAL FIXATION (ORIF) WRIST FRACTURE with allograft (Left) Patient reports pain as well controlled, c/o difficulty sleeping last night.Denies N/T/N/V. No complaints in regards to LUE.   Objective: Vital signs in last 24 hours: Temp:  [94.1 F (34.5 C)-98.5 F (36.9 C)] 98 F (36.7 C) (01/19 0557) Pulse Rate:  [83-96] 83 (01/19 0557) Resp:  [16-18] 18 (01/19 0557) BP: (115-157)/(57-67) 145/57 mmHg (01/19 0557) SpO2:  [95 %-98 %] 98 % (01/19 0557)  Intake/Output from previous day: 01/18 0701 - 01/19 0700 In: 3701.7 [P.O.:600; I.V.:3101.7] Out: 6120 [Urine:6100; Blood:20] Intake/Output this shift:     Recent Labs  10/27/13 0727 10/28/13 0557  HGB 8.4* 8.1*    Recent Labs  10/27/13 0727 10/28/13 0557  WBC 7.8 7.9  RBC 3.18* 3.09*  HCT 26.3* 26.1*  PLT 291 300    Recent Labs  10/27/13 0727 10/28/13 0557  NA 133* 135*  K 4.3 4.4  CL 99 99  CO2 25 25  BUN 17 15  CREATININE 0.96 0.86  GLUCOSE 134* 131*  CALCIUM 7.5* 7.9*   No results found for this basename: LABPT, INR,  in the last 72 hours  Pleasant, NAD, A&Ox3 Head atraumatic Chest equal exp respirations non-labored Lue: splint clean and intact, excellent digital rom, drain removed without difficulties, no signs of infx, ascending cellulitis  Assessment/Plan: 1 Day Post-Op Procedure(s) (LRB): OPEN REDUCTION INTERNAL FIXATION (ORIF) WRIST FRACTURE with allograft (Left) Patient doing well in regards to LUE, recommend OT/PT for adls, digital rom. Sling when oob. Patient will be able to weightbear through forearm and elbow. Cont to follow, D/C planning per Dr. Alvan Dame. If possible coordinate follow up visits in our office. Wound will need checked and sutures removed in 2 weeks.  Jermaine Neuharth L 10/29/2013, 8:47 PM

## 2013-10-29 NOTE — Evaluation (Signed)
Physical Therapy Evaluation Patient Details Name: [name] MRN: [id number] DOB: [birth year] Today's Date: 10/29/2013 Time: 1436-1500 PT Time Calculation (min): 24 min  PT Assessment / Plan / Recommendation History of Present Illness  R THA revision,  L wrist ORIF due to fx.  PMH:  multiple fxs due to metabolic bone disease including L 1 compression fx with back brace.  Pt has posterior hip precautions, limited active use of R LE per Dr. Alvan Dame with recommendation of hoyer lift transfers.  Pt is NWB on L wrist and hand.  Clinical Impression  Pt will benefit from SNF post acute; will follow this venue; pt with skin tear R posterior thigh (near top of brace), he was doing heel slides with KI in place, RN notified and placed dressing over area; educated  Pt on only doing heel slides with brace loose/with assist    PT Assessment  Patient needs continued PT services    Follow Up Recommendations  SNF    Does the patient have the potential to tolerate intense rehabilitation      Barriers to Discharge        Equipment Recommendations  None recommended by PT    Recommendations for Other Services     Frequency Min 3X/week    Precautions / Restrictions Precautions Precautions: Back;Posterior Hip;Fall Precaution Comments: pt is able to state hip precautions and limitations with use of L UE Required Braces or Orthoses: Spinal Brace Restrictions Weight Bearing Restrictions: Yes LUE Weight Bearing: Non weight bearing RLE Weight Bearing: Non weight bearing Other Position/Activity Restrictions: pt is ONLY ALLOWED GENTLE HEEL SLIDES,  QUAD AND GLUT SETS; NO "BIG ACTIVE MOVEMENT"  per  Dr. Alvan Dame; only OOB with lift per MD   Pertinent Vitals/Pain Pain controlled      Mobility  Bed Mobility General bed mobility comments: pt only allowed OOB with lift per Dr. Alvan Dame    Exercises General Exercises - Lower Extremity Ankle Circles/Pumps: AROM;10 reps;Both Quad Sets: AROM;Both;10  reps;Strengthening Gluteal Sets: AROM;10 reps;Strengthening;Both Heel Slides: AAROM;10 reps;Left;Right;AROM Hip ABduction/ADduction: AROM;Left;10 reps Straight Leg Raises: AROM;Strengthening;10 reps;Left   PT Diagnosis: Generalized weakness  PT Problem List: Decreased strength;Decreased range of motion;Decreased activity tolerance;Decreased balance;Decreased mobility;Decreased knowledge of use of DME;Decreased safety awareness PT Treatment Interventions: Therapeutic activities;Therapeutic exercise;Patient/family education     PT Goals(Current goals can be found in the care plan section) Acute Rehab PT Goals Patient Stated Goal: Rehab at [facility]. PT Goal Formulation: With patient Time For Goal Achievement: 11/05/13 Potential to Achieve Goals: Good  Visit Information  Last PT Received On: 10/29/13 Assistance Needed: +2 History of Present Illness: R THA revision,  L wrist ORIF due to fx.  PMH:  multiple fxs due to metabolic bone disease including L 1 compression fx with back brace.  Pt has posterior hip precautions, limited active use of R LE per Dr. Alvan Dame with recommendation of hoyer lift transfers.  Pt is NWB on L wrist and hand.       Prior Functioning  Home Living Family/patient expects to be discharged to:: Skilled nursing facility Additional Comments: pt expects to go back to [place] Communication Communication: No difficulties Dominant Hand: Left    Cognition  Cognition Arousal/Alertness: Awake/alert Behavior During Therapy: WFL for tasks assessed/performed Overall Cognitive Status: Within Functional Limits for tasks assessed    Extremity/Trunk Assessment Upper Extremity Assessment Upper Extremity Assessment: LUE deficits/detail LUE Deficits / Details: splinted proximal of elbow to MPs, full shoulder and finger AROM LUE: Unable to fully assess  due to immobilization Lower Extremity Assessment Lower Extremity Assessment: RLE deficits/detail RLE Deficits /  Details: ankle WFL; RLE: Unable to fully assess due to immobilization   Balance    End of Session PT - End of Session Activity Tolerance: Patient tolerated treatment well Patient left: in bed Nurse Communication: Mobility status  GP     [facility] 10/29/2013, 3:55 PM

## 2013-10-29 NOTE — Progress Notes (Signed)
ST Rehab bed available at [place] Tuesday if pt is stale for d/c.  CSW will continue to follow to assist with d/c planning.  Werner Lean LCSW [phone]

## 2013-10-29 NOTE — Progress Notes (Signed)
Patient ID: [name], male   DOB: Jul 11, 1946, 68 y.o.   MRN: [id number] Subjective: POD#3 from revision right total hip repalcement    Patient reports pain as mild. Awake alert oriented, asking appropriate questions about management of his hip  Objective:   VITALS:   Filed Vitals:   10/29/13 0557  BP: 145/57  Pulse: 83  Temp: 98 F (36.7 C)  Resp: 18    Neurovascular intact Incision: dressing C/D/I  LABS  Recent Labs  10/26/13 1258 10/27/13 0727 10/28/13 0557  HGB 10.2* 8.4* 8.1*  HCT 30.0* 26.3* 26.1*  WBC  --  7.8 7.9  PLT  --  291 300     Recent Labs  10/26/13 1258 10/27/13 0727 10/28/13 0557  NA 133* 133* 135*  K 5.1 4.3 4.4  BUN  --  17 15  CREATININE  --  0.96 0.86  GLUCOSE 125* 134* 131*    No results found for this basename: LABPT, INR,  in the last 72 hours   Assessment/Plan: 1 Day Post-Op Procedure(s) (LRB): OPEN REDUCTION INTERNAL FIXATION (ORIF) WRIST FRACTURE with allograft (Left)   Discharge to SNF tomorrow or Wednesday  Maintain foley due to limited mobility/restrictions to better monitor I&Os  Reviewed with him and wrote down instructions on activity Want to limit his active movement and utilization of his right leg Given his restrictions on his contralateral left upper extremity he unfortunately will be hoyer lift transfers only Can do, permitted to do upper extremity exercises and left lower extremity exercises, and limited heel slides.  But permitted to do isometrics

## 2013-10-30 LAB — TYPE AND SCREEN
ABO/RH(D): A POS
Antibody Screen: NEGATIVE
UNIT DIVISION: 0
UNIT DIVISION: 0
Unit division: 0
Unit division: 0

## 2013-10-30 MED ORDER — OXYCODONE HCL 5 MG PO TABS: 5.0000 mg | ORAL_TABLET | ORAL | Status: DC

## 2013-10-30 MED ORDER — METHOCARBAMOL 500 MG PO TABS: 500.0000 mg | ORAL_TABLET | Freq: Four times a day (QID) | ORAL | Status: DC | PRN

## 2013-10-30 MED ORDER — DSS 100 MG PO CAPS: 100.0000 mg | ORAL_CAPSULE | Freq: Two times a day (BID) | ORAL | Status: DC

## 2013-10-30 MED ORDER — CLONAZEPAM 1 MG PO TABS
ORAL_TABLET | ORAL | Status: DC
Start: 2013-10-30 — End: 2013-11-20

## 2013-10-30 NOTE — Progress Notes (Signed)
Pt to d/c to [facility]. Report given to nurse at facility.

## 2013-10-30 NOTE — Progress Notes (Signed)
   Subjective: 4 Days Post-Op Procedure(s) (LRB): Right hip revision 2 Days Post-Op Procedure(s) (LRB): OPEN REDUCTION INTERNAL FIXATION (ORIF) WRIST FRACTURE with allograft (Left)   Patient reports pain as mild, pain controlled. No events throughout the night. He is able to be WB through the left elbow, we will change him to be TDWB on the right LE for transfers only.  Objective:   VITALS:   Filed Vitals:   10/30/13  BP: 149/75  Pulse: 67  Temp: 97.9 F (36.6 C)   Resp: 20    Neurovascular intact Dorsiflexion/Plantar flexion intact Incision: dressing C/D/I No cellulitis present Compartment soft DNVI on the left hand Good movement of the left fingers   LABS  Recent Labs  10/28/13 0557  HGB 8.1*  HCT 26.1*  WBC 7.9  PLT 300     Recent Labs  10/28/13 0557  NA 135*  K 4.4  BUN 15  CREATININE 0.86  GLUCOSE 131*     Assessment/Plan: 4 Days Post-Op Procedure(s) (LRB): Right hip revision 2 Days Post-Op Procedure(s) (LRB): OPEN REDUCTION INTERNAL FIXATION (ORIF) WRIST FRACTURE with allograft (Left)  Up with therapy, to practice transfers Discharge to SNF when ready Follow up in 2 weeks at [facility]. Follow up with OLIN,Fritzi Scripter D in 2 weeks.  Contact information:  [facility] [address], [address] [place] Xitlaly Ault   PAC  10/30/2013, 11:22 AM

## 2013-10-30 NOTE — Progress Notes (Addendum)
Physical Therapy Treatment Patient Details Name: CASIMIRO LIENHARD MRN: [id number] DOB: 1946-05-30 Today's Date: 10/30/2013 Time: 11:08 - 11:22   PT Assessment / Plan / Recommendation  History of Present Illness R THRevision plus L wrist ORIF castsed.  L1 comp fx with back brace. Per Dr Alvan Dame PN today pt now TTWB R LE for transfers only.  D/C KI when OOB.    PT Comments   Performed THR TE's while supine in bed.  Follow Up Recommendations  SNF     Does the patient have the potential to tolerate intense rehabilitation     Barriers to Discharge        Equipment Recommendations  None recommended by PT    Recommendations for Other Services    Frequency Min 3X/week   Progress towards PT Goals Progress towards PT goals: Progressing toward goals  Plan      Precautions / Restrictions Precautions Precautions: Back;Posterior Hip;Fall Precaution Comments: Pt recalls 3/3 THP Required Braces or Orthoses: Spinal Brace Restrictions Weight Bearing Restrictions: Yes LUE Weight Bearing: Non weight bearing (allowed to WB thru forearm) RLE Weight Bearing: Touchdown weight bearing Other Position/Activity Restrictions: n/a   Pertinent Vitals/Pain       Exercises   Total Hip Replacement TE's 10 reps ankle pumps 10 reps knee presses 10 reps heel slides AAROM gentle 10 reps SAQ's 10 reps ABD Followed by [facility]     PT Goals (current goals can now be found in the care plan section)    Visit Information  Last PT Received On: 10/30/13 Assistance Needed: +2 History of Present Illness: R THRevision plus L wrist ORIF castsed.  L1 comp fx with back brace. Per Dr Alvan Dame PN today pt now TTWB R LE for transfers only.  D/C KI when OOB.     Subjective Data      Cognition       Balance     End of Session PT - End of Session Activity Tolerance: Patient tolerated treatment well Patient left: in bed;with family/visitor present Nurse Communication: Mobility status   Rica Koyanagi  PTA [facility]  Acute   Rehab Pager      [phone]

## 2013-10-30 NOTE — Progress Notes (Signed)
Physical Therapy Treatment Patient Details Name: BOOKERT GUZZI MRN: [id number] DOB: 09/03/1946 Today's Date: 10/30/2013 Time: 3500-9381 PT Time Calculation (min): 29 min  PT Assessment / Plan / Recommendation  History of Present Illness R THRevision plus L wrist ORIF castsed.  L1 comp fx with back brace. Per Dr Alvan Dame PN today pt now TTWB R LE for transfers only.  D/C KI when OOB.    PT Comments   Assisted OOB to recliner via L platform walker at TTWB R LE.    Follow Up Recommendations  SNF ([facility])     Does the patient have the potential to tolerate intense rehabilitation     Barriers to Discharge        Equipment Recommendations  None recommended by PT    Recommendations for Other Services    Frequency Min 3X/week   Progress towards PT Goals Progress towards PT goals: Progressing toward goals  Plan      Precautions / Restrictions Precautions Precautions: Back;Posterior Hip;Fall Precaution Comments: Pt recalls 3/3 THP Required Braces or Orthoses: Spinal Brace Restrictions Weight Bearing Restrictions: Yes LUE Weight Bearing: Non weight bearing (allowed to WB thru forearm) RLE Weight Bearing: Touchdown weight bearing Other Position/Activity Restrictions: n/a   Pertinent Vitals/Pain     Mobility  Bed Mobility Overal bed mobility: Needs Assistance Bed Mobility: Supine to Sit Supine to sit: Min guard;Min assist General bed mobility comments: impulsive and uses momentum.  VC's for safety and caution to R hip percautions Transfers Overall transfer level: Needs assistance Equipment used: Left platform walker Transfers: Sit to/from Stand Sit to Stand: +2 physical assistance;Min assist General transfer comment: again impulsive.  VC's to control stand to sit as pt has hx hip dislocations.   Ambulation/Gait Gait velocity: Transfers only per Dr Alvan Dame.  Performed static standing x 2 min   PT Goals (current goals can now be found in the care plan section)    Visit  Information  Last PT Received On: 10/30/13 Assistance Needed: +2 History of Present Illness: R THRevision plus L wrist ORIF castsed.  L1 comp fx with back brace. Per Dr Alvan Dame PN today pt now TTWB R LE for transfers only.  D/C KI when OOB.     Subjective Data      Cognition       Balance     End of Session PT - End of Session Activity Tolerance: Patient tolerated treatment well Patient left: in chair;with family/visitor present Nurse Communication: Mobility status   Rica Koyanagi  PTA [facility]  Acute  Rehab Pager      [phone]

## 2013-10-30 NOTE — Care Management Note (Signed)
    Page 1 of 1   10/30/2013     4:56:01 PM   CARE MANAGEMENT NOTE 10/30/2013  Patient:  DAYN, BARICH   Account Number:  [id number]  Date Initiated:  10/30/2013  Documentation initiated by:  Sherrin Daisy  Subjective/Objective Assessment:   dx rt total hip replacemnt     Action/Plan:   SNF rehab   Anticipated DC Date:  10/30/2013   Anticipated DC Plan:  SKILLED NURSING FACILITY  In-house referral  Clinical Social Worker      DC Planning Services  CM consult      Choice offered to / List presented to:             Status of service:  Completed, signed off Medicare Important Message given?   (If response is "NO", the following Medicare IM given date fields will be blank) Date Medicare IM given:   Date Additional Medicare IM given:    Discharge Disposition:  SKILLED NURSING FACILITY  Per UR Regulation:    If discussed at Long Length of Stay Meetings, dates discussed:    Comments:

## 2013-10-30 NOTE — Discharge Summary (Signed)
Physician Discharge Summary  Patient ID: [name] MRN: [id number] DOB/AGE: Dec 06, 1945 68 y.o.  Admit date: 10/26/2013 Discharge date:  10/30/2013  Procedures:  Procedure(s) (LRB): OPEN REDUCTION INTERNAL FIXATION (ORIF) WRIST FRACTURE with allograft (Left)  Attending Physician:  Dr. Paralee Cancel   Admission Diagnoses:    1. Right hip pain / instability     2. Left wrist fracture  Discharge Diagnoses:  Principal Problem:   S/P right TH revision  Past Medical History  Diagnosis Date  . Acid reflux   . Thyroid disease     HX GRAVES DISEASE  . Low testosterone, possible hypogonadism 10/01/2012  . Lumbago   . High cholesterol   . Anxiety   . Depression   . CAD (coronary artery disease)   . Arthritis   . Environmental allergies   . Vertebral compression fracture     L1-wears brace  . Sleep apnea     cpap machine-settings 17  . Transfusion history     s/p 12'13 hip surgery  . A-fib     hx.- sinus rhythm now  . Fracture of wrist 10/23/13    LEFT  . Osteoporosis   . Metabolic bone disease   . Plaque psoriasis     HPI: [name], 68 y.o. male, has a history of pain and functional disability in the right hip due to trauma. A In August of 2013 he fell, broke his hip and a total hip replacement was performed to fix the hip. In December of 2013 he fell and caused a periprosthetic fracture on the right hip as well and wrist fracture. Upon return to the clinic it was noted that he had almost no boney ingrowth of the prosthesis. He was worked up for infection. It was decided to revise the hip components. While waiting to do hip surgery he rebroke his ankle through a plate and screws holding a previous fracture in place. He has also been diagnosed with compression fractures of the lumbar spine. Through all of the fractures he has sustained he saw an endocrinologist that diagnosed him with Metabolic Bone Disease. He had a revision of the right hip and was doing well, he  then returned to the clinic and was brought to the OR to reduce the right hip after dislocating the hip. After multiple attempts the hip remained unstable and would not stay reduced. Discussion was had regarding the possible reasons fot eh instability. He is now ready to proceed with a revision of the right total hip arthroplasty vs resection of the hip arthroplasty and placement of an antibiotic spacer, if there is infection. Risks, benefits and expectations were discussed with the patient. Risks including but not limited to the risk of anesthesia, blood clots, nerve damage, blood vessel damage, failure of the prosthesis, infection and up to and including death. Patient understand the risks, benefits and expectations and wishes to proceed with surgery.    PCP: Joseph Morning, DO   Discharged Condition: good  Hospital Course:  Patient underwent the above stated procedure on 10/26/2013. Patient tolerated the procedure well and brought to the recovery room in good condition and subsequently to the Lane.  POD #1 BP: 110/60 ; Pulse: 80 ; Temp: 97.7 F (36.5 C) ; Resp: 20  Patient reports pain as mild, pain controlled in the right hip. Some sharp pains in the left wrist from time to time. No events throughout the night. Discussed activity and WB status, he will be NWB through the left wrist  after surgery and to protect the hip we will also make him NWB. Neurovascular intact, dorsiflexion/plantar flexion intact, incision: dressing C/D/I, no cellulitis present and compartment soft.   LABS  Basename    HGB  8.4  HCT  26.3   POD #2  Patient underwent the ORIF of the left wrist on 10/27/2013. Patient tolerated the procedure well and brought to the recovery room in good condition and subsequently to the Lane.  LABS  Basename    HGB  8.1  HCT  26.1   POD #3  BP: 145/57 ; Pulse: 83 ; Temp: 98 F (36.7 C) ; Resp: 18  Patient reports pain as mild.  Awake alert oriented, asking appropriate questions  about management of his hip. Neurovascular intact, dorsiflexion/plantar flexion intact, incision: dressing C/D/I, no cellulitis present and compartment soft.   LABS   No new labs  POD #4 BP: 149/75 ; Pulse: 67 ; Temp: 97.9 F (36.6 C) ; Resp: 20  Patient reports pain as mild, pain controlled. No events throughout the night. He is able to be WB through the left elbow, we will change him to be TDWB on the right LE for transfers only. Neurovascular intact, dorsiflexion/plantar flexion intact, incision: dressing C/D/I, no cellulitis present and compartment soft, DNVI on the left hand  And good movement of the left fingers.  LABS   No new labs    Discharge Exam: General appearance: alert, cooperative and no distress Extremities: Homans sign is negative, no sign of DVT, no edema, redness or tenderness in the calves or thighs, no ulcers, gangrene or trophic changes and DNVI with good movement of the fingers on the left hand  Disposition: [facility] with follow up in 2 weeks   Follow-up Information   Follow up with Joseph Pole, MD. Schedule an appointment as soon as possible for a visit in 2 weeks.   Specialty:  Orthopedic Surgery   Contact information:   [address] [place] [address] [phone]       Follow up with Joseph Floor, MD In 2 weeks. (make at same time as appointment with [facility])    Specialty:  Orthopedic Surgery   Contact information:   [address] [place] [address] [place] [address] [phone]       Discharge Orders   Future Orders Complete By Expires   Call MD / Call 911  As directed    Comments:     If you experience chest pain or shortness of breath, CALL 911 and be transported to the hospital emergency room.  If you develope a fever above 101 F, pus (white drainage) or increased drainage or redness at the wound, or calf pain, call your surgeon's office.   Change dressing  As directed    Comments:     Daily  dressing changes with guaze and tape.  Keep the area dry and clean until follow up.   Constipation Prevention  As directed    Comments:     Drink plenty of fluids.  Prune juice may be helpful.  You may use a stool softener, such as Colace (over the counter) 100 mg twice a day.  Use MiraLax (over the counter) for constipation as needed.   Diet - low sodium heart healthy  As directed    Discharge instructions  As directed    Comments:     Daily dressing changes with guaze and tape.  Keep the area dry and clean until follow up. Follow  up in 2 weeks at [facility]. Call with any questions or concerns.   Non weight bearing  As directed    Comments:     NWB through left wrist.   Questions:     Laterality:  left   Extremity:     TED hose  As directed    Comments:     Use stockings (TED hose) for 2 weeks on both leg(s).  You may remove them at night for sleeping.   Touch down weight bearing  As directed    Comments:     For transfers   Questions:     Laterality:  right   Extremity:  Lower   Weight bearing as tolerated  As directed    Comments:     WBAT through left elbow .  NWB through left wrist.   Questions:     Laterality:  left   Extremity:  Lower         Medication List    STOP taking these medications       HYDROcodone-acetaminophen 7.5-325 MG per tablet  Commonly known as:  NORCO      TAKE these medications       amiodarone 100 MG tablet  Commonly known as:  PACERONE  Take 100 mg by mouth daily.     atorvastatin 80 MG tablet  Commonly known as:  LIPITOR  Take 80 mg by mouth every evening.     BLINK TEARS 0.25 % Soln  Generic drug:  Polyethylene Glycol 400  Apply 1 drop to eye 2 (two) times daily as needed (dry eyes).     clonazePAM 1 MG tablet  Commonly known as:  KLONOPIN  Take one tablet by mouth three times daily     diltiazem 240 MG 24 hr capsule  Commonly known as:  CARDIZEM CD  Take 240 mg by mouth every Lane.     doxycycline 100 MG  tablet  Commonly known as:  VIBRA-TABS  Take 100 mg by mouth daily.     DSS 100 MG Caps  Take 100 mg by mouth 2 (two) times daily.     DULoxetine 60 MG capsule  Commonly known as:  CYMBALTA  Take 60 mg by mouth 2 (two) times daily.     ENBREL SURECLICK 50 MG/ML injection  Generic drug:  etanercept  Inject 50 mg as directed once a week. Last  Dose last dose was 10/19/2013     ferrous sulfate 325 (65 FE) MG tablet  Take 325 mg by mouth 2 (two) times daily with a meal.     FORTEO [place]  Inject 20 mg into the skin daily.     levothyroxine 125 MCG tablet  Commonly known as:  SYNTHROID, LEVOTHROID  Take 250 mcg by mouth daily before breakfast.     loratadine 10 MG tablet  Commonly known as:  CLARITIN  Take 10 mg by mouth daily.     methocarbamol 500 MG tablet  Commonly known as:  ROBAXIN  Take 1 tablet (500 mg total) by mouth every 6 (six) hours as needed for muscle spasms.     multivitamin with minerals Tabs tablet  Take 1 tablet by mouth daily.     nitroGLYCERIN 0.4 MG SL tablet  Commonly known as:  NITROSTAT  Place 0.4 mg under the tongue every 5 (five) minutes as needed for chest pain. x3 doses as needed for chest pain     omeprazole 20 MG capsule  Commonly known as:  PRILOSEC  Take 20 mg by mouth daily.     oxyCODONE 5 MG immediate release tablet  Commonly known as:  Oxy IR/ROXICODONE  Take 1-3 tablets (5-15 mg total) by mouth every 4 (four) hours.     oyster calcium 500 MG Tabs tablet  Take 500 mg of elemental calcium by mouth 2 (two) times daily.     polyethylene glycol packet  Commonly known as:  MIRALAX / GLYCOLAX  Take 17 g by mouth daily as needed. Constipation.     PROBIOTIC PO  Take 1 tablet by mouth every Lane.     ranolazine 500 MG 12 hr tablet  Commonly known as:  RANEXA  Take 500 mg by mouth 2 (two) times daily.     tamsulosin 0.4 MG Caps capsule  Commonly known as:  FLOMAX  Take 0.4 mg by mouth every evening.     testosterone cypionate 200  MG/ML injection  Commonly known as:  DEPOTESTOTERONE CYPIONATE  Inject 1 mL (200 mg total) into the muscle every 14 (fourteen) days.     vitamin C 1000 MG tablet  Take 1,000 mg by mouth daily.     Vitamin D3 5000 UNITS Caps  Take 5,000 Units by mouth daily.     XARELTO 20 MG Tabs tablet  Generic drug:  Rivaroxaban  Take 20 mg by mouth daily with supper.         Signed: [place]. Kenna Seward   PAC  10/30/2013, 11:22 AM

## 2013-10-31 ENCOUNTER — Other Ambulatory Visit: Payer: Self-pay | Admitting: *Deleted

## 2013-10-31 MED ORDER — OXYCODONE HCL 10 MG PO TABS: ORAL_TABLET | ORAL | Status: DC

## 2013-10-31 MED ORDER — OXYCODONE HCL 15 MG PO TABS: ORAL_TABLET | ORAL | Status: DC

## 2013-10-31 MED ORDER — OXYCODONE HCL 5 MG PO TABS: ORAL_TABLET | ORAL | Status: DC

## 2013-10-31 NOTE — Progress Notes (Signed)
Clinical Social Work Department CLINICAL SOCIAL WORK PLACEMENT NOTE 10/31/2013  Patient:  [name], [name]  Account Number:  [id number] Admit date:  10/26/2013  Clinical Social Worker:  Levie Heritage  Date/time:  10/27/2013 10:53 AM  Clinical Social Work is seeking post-discharge placement for this patient at the following level of care:   SKILLED NURSING   (*CSW will update this form in Epic as items are completed)     Patient/family provided with [place] Department of Clinical Social Work's list of facilities offering this level of care within the geographic area requested by the patient (or if unable, by the patient's family).  10/27/2013  Patient/family informed of their freedom to choose among providers that offer the needed level of care, that participate in Medicare, Medicaid or managed care program needed by the patient, have an available bed and are willing to accept the patient.  10/27/2013  Patient/family informed of [facility]' ownership interest in [facility], as well as of the fact that they are under no obligation to receive care at this facility.  PASARR submitted to EDS on  PASARR number received from [place] on   FL2 transmitted to all facilities in geographic area requested by pt/family on  10/27/2013 FL2 transmitted to all facilities within larger geographic area on   Patient informed that his/her managed care company has contracts with or will negotiate with  certain facilities, including the following:     Patient/family informed of bed offers received:  10/29/2013 Patient chooses bed at [facility] [place] Physician recommends and patient chooses bed at    Patient to be transferred to [facility] on  10/30/2013 Patient to be transferred to facility by [facility]  The following physician request were entered in Epic:   Additional Comments:  Werner Lean LCSW [phone]

## 2013-11-02 ENCOUNTER — Non-Acute Institutional Stay (SKILLED_NURSING_FACILITY): Payer: Medicare Other | Admitting: Internal Medicine

## 2013-11-02 ENCOUNTER — Encounter: Payer: Self-pay | Admitting: Internal Medicine

## 2013-11-02 DIAGNOSIS — D649 Anemia, unspecified: Secondary | ICD-10-CM

## 2013-11-02 DIAGNOSIS — F3289 Other specified depressive episodes: Secondary | ICD-10-CM

## 2013-11-02 DIAGNOSIS — I480 Paroxysmal atrial fibrillation: Secondary | ICD-10-CM

## 2013-11-02 DIAGNOSIS — F329 Major depressive disorder, single episode, unspecified: Secondary | ICD-10-CM

## 2013-11-02 DIAGNOSIS — M8440XA Pathological fracture, unspecified site, initial encounter for fracture: Secondary | ICD-10-CM

## 2013-11-02 DIAGNOSIS — S5291XA Unspecified fracture of right forearm, initial encounter for closed fracture: Secondary | ICD-10-CM

## 2013-11-02 DIAGNOSIS — K5900 Constipation, unspecified: Secondary | ICD-10-CM

## 2013-11-02 DIAGNOSIS — F32A Depression, unspecified: Secondary | ICD-10-CM

## 2013-11-02 DIAGNOSIS — M8080XA Other osteoporosis with current pathological fracture, unspecified site, initial encounter for fracture: Secondary | ICD-10-CM

## 2013-11-02 DIAGNOSIS — Z96649 Presence of unspecified artificial hip joint: Secondary | ICD-10-CM

## 2013-11-02 DIAGNOSIS — M810 Age-related osteoporosis without current pathological fracture: Secondary | ICD-10-CM

## 2013-11-02 DIAGNOSIS — I4891 Unspecified atrial fibrillation: Secondary | ICD-10-CM

## 2013-11-02 DIAGNOSIS — S5290XA Unspecified fracture of unspecified forearm, initial encounter for closed fracture: Secondary | ICD-10-CM

## 2013-11-02 DIAGNOSIS — E785 Hyperlipidemia, unspecified: Secondary | ICD-10-CM

## 2013-11-02 DIAGNOSIS — E039 Hypothyroidism, unspecified: Secondary | ICD-10-CM

## 2013-11-02 NOTE — Progress Notes (Signed)
Patient ID: [name], male   DOB: Feb 18, 1946, 68 y.o.   MRN: [id number]    [facility] Gemma living [place]    PCP: Janie Morning, DO  Code Status: full code  No Known Allergies  Chief Complaint: new admit  HPI:  68 y/o male patient is here for STR after hospital admission from 10/26/13- 10/30/13 with right hip pain and left wrist fracture. He underwent right total hip revision and ORIF on left wrist. He was seen in his room. He is in no distress. He denies any complaints this visit. No concern from staff. He is participating in therapy  Review of Systems:   Constitutional: Negative for fever, chills, weight loss, malaise/fatigue and diaphoresis.  HENT: Negative for congestion, hearing loss and sore throat.   Eyes: Negative for blurred vision, double vision and discharge.  Respiratory: Negative for cough, sputum production, shortness of breath and wheezing.   Cardiovascular: Negative for chest pain, palpitations, orthopnea and leg swelling.  Gastrointestinal: Negative for heartburn, nausea, vomiting, abdominal pain, diarrhea and constipation.  Genitourinary: Negative for dysuria, urgency, frequency and flank pain.  Musculoskeletal: Negative for back pain, falls. Skin: Negative for itching and rash.  Neurological: Positive for weakness and unsteady gait.  Negative for dizziness, tingling, focal weakness and headaches.  Psychiatric/Behavioral: Negative for depression and memory loss. The patient is not nervous/anxious.     Past Medical History  Diagnosis Date  . Acid reflux   . Thyroid disease     HX GRAVES DISEASE  . Low testosterone, possible hypogonadism 10/01/2012  . Lumbago   . High cholesterol   . Anxiety   . Depression   . CAD (coronary artery disease)   . Arthritis   . Environmental allergies   . Vertebral compression fracture     L1-wears brace  . Sleep apnea     cpap machine-settings 17  . Transfusion history     s/p 12'13 hip surgery  . A-fib     hx.- sinus  rhythm now  . Fracture of wrist 10/23/13    LEFT  . Osteoporosis   . Metabolic bone disease   . Plaque psoriasis    Past Surgical History  Procedure Laterality Date  . Knee surgery Bilateral 2012    Total knee replacements  . Appendectomy    . Right femur surgery  05/2012  . Orif wrist fracture  10/02/2012    Procedure: OPEN REDUCTION INTERNAL FIXATION (ORIF) WRIST FRACTURE;  Surgeon: Roseanne Kaufman, MD;  Location: [facility];  Service: Orthopedics;  Laterality: Right;  WITH   ANTIBIOTIC  CEMENT  . Total hip revision  10/05/2012    Procedure: TOTAL HIP REVISION;  Surgeon: [name], MD;  Location: [facility];  Service: Orthopedics;  Laterality: Right;  RIGHT TOTAL HIP REVISION  . Hardware removal  10/05/2012    Procedure: HARDWARE REMOVAL;  Surgeon: [name], MD;  Location: [facility];  Service: Orthopedics;  Laterality: Right;  REMOVING  STRYKER  GAMMA NAIL  . Wrist fracture surgery  05/2012  . Orif tibia fracture Right 02/06/2013    Procedure: OPEN REDUCTION INTERNAL FIXATION (ORIF) TIBIA FRACTURE WITH IM ROD FIBULA;  Surgeon: Rozanna Box, MD;  Location: [place];  Service: Orthopedics;  Laterality: Right;  . Cardiac catheterization  2013  . Orif tibia fracture Right 07/03/2013    Procedure: RIGHT TIBIA NON UNION REPAIR ;  Surgeon: Rozanna Box, MD;  Location: [place];  Service: Orthopedics;  Laterality: Right;  . Hardware removal Right  07/03/2013    Procedure: HARDWARE REMOVAL RIGHT TIBIA ;  Surgeon: Rozanna Box, MD;  Location: [place];  Service: Orthopedics;  Laterality: Right;  . Joint replacement      hip-right x2  . Tonsillectomy    . Total hip revision Right 09/17/2013    Procedure: REVISION RIGHT TOTAL HIP ARTHROPLASTY ;  Surgeon: [name], MD;  Location: [facility];  Service: Orthopedics;  Laterality: Right;  . Hip closed reduction Right 10/15/2013    Procedure: CLOSED MANIPULATION HIP;  Surgeon: [name], MD;  Location: [facility];  Service: Orthopedics;  Laterality: Right;    . Total hip revision Right 10/26/2013    Procedure: REVISION RIGHT TOTAL HIP ARTHROPLASTY;  Surgeon: [name], MD;  Location: [facility];  Service: Orthopedics;  Laterality: Right;  . Orif wrist fracture Left 10/28/2013    Procedure: OPEN REDUCTION INTERNAL FIXATION (ORIF) WRIST FRACTURE with allograft;  Surgeon: Roseanne Kaufman, MD;  Location: [facility];  Service: Orthopedics;  Laterality: Left;  DVR Plate   Social History:   reports that he quit smoking about 16 months ago. He has never used smokeless tobacco. He reports that he drinks alcohol. He reports that he does not use illicit drugs.  Family History  Problem Relation Age of Onset  . CAD Father 68  . Arthritis Mother     Medications: Patient's Medications  New Prescriptions   No medications on file  Previous Medications   AMIODARONE (PACERONE) 100 MG TABLET    Take 100 mg by mouth daily.   ASCORBIC ACID (VITAMIN C) 1000 MG TABLET    Take 1,000 mg by mouth daily.   ATORVASTATIN (LIPITOR) 80 MG TABLET    Take 80 mg by mouth every evening.    CHOLECALCIFEROL (VITAMIN D3) 5000 UNITS CAPS    Take 5,000 Units by mouth daily.   CLONAZEPAM (KLONOPIN) 1 MG TABLET    Take one tablet by mouth three times daily   DILTIAZEM (CARDIZEM CD) 240 MG 24 HR CAPSULE    Take 240 mg by mouth every morning.    DOCUSATE SODIUM 100 MG CAPS    Take 100 mg by mouth 2 (two) times daily.   DOXYCYCLINE (VIBRA-TABS) 100 MG TABLET    Take 100 mg by mouth daily.   DULOXETINE (CYMBALTA) 60 MG CAPSULE    Take 60 mg by mouth 2 (two) times daily.   ENBREL SURECLICK 50 MG/ML INJECTION    Inject 50 mg as directed once a week. Last  Dose last dose was 10/19/2013   FERROUS SULFATE 325 (65 FE) MG TABLET    Take 325 mg by mouth 2 (two) times daily with a meal.   LEVOTHYROXINE (SYNTHROID, LEVOTHROID) 125 MCG TABLET    Take 250 mcg by mouth daily before breakfast.   LORATADINE (CLARITIN) 10 MG TABLET    Take 10 mg by mouth daily.   METHOCARBAMOL (ROBAXIN) 500 MG TABLET     Take 1 tablet (500 mg total) by mouth every 6 (six) hours as needed for muscle spasms.   MULTIPLE VITAMIN (MULTIVITAMIN WITH MINERALS) TABS TABLET    Take 1 tablet by mouth daily.   NITROGLYCERIN (NITROSTAT) 0.4 MG SL TABLET    Place 0.4 mg under the tongue every 5 (five) minutes as needed for chest pain. x3 doses as needed for chest pain   OMEPRAZOLE (PRILOSEC) 20 MG CAPSULE    Take 20 mg by mouth daily.   OXYCODONE (OXY IR/ROXICODONE) 5 MG IMMEDIATE RELEASE  TABLET    Take one tablet by mouth every 4 hours as needed for pain level 1-4   OXYCODONE (ROXICODONE) 15 MG IMMEDIATE RELEASE TABLET    Take one tablet by mouth every 4 hours as needed for pain level 8-10   OXYCODONE HCL 10 MG TABS    Take one tablet by mouth every 4 hours as needed for pain level 5-7   OYSTER SHELL (OYSTER CALCIUM) 500 MG TABS TABLET    Take 500 mg of elemental calcium by mouth 2 (two) times daily.   POLYETHYLENE GLYCOL (MIRALAX / GLYCOLAX) PACKET    Take 17 g by mouth daily as needed. Constipation.   POLYETHYLENE GLYCOL 400 (BLINK TEARS) 0.25 % SOLN    Apply 1 drop to eye 2 (two) times daily as needed (dry eyes).   PROBIOTIC PRODUCT (PROBIOTIC PO)    Take 1 tablet by mouth every morning.    RANOLAZINE (RANEXA) 500 MG 12 HR TABLET    Take 500 mg by mouth 2 (two) times daily.   RIVAROXABAN (XARELTO) 20 MG TABS    Take 20 mg by mouth daily with supper.   TAMSULOSIN HCL (FLOMAX) 0.4 MG CAPS    Take 0.4 mg by mouth every evening.    TERIPARATIDE, RECOMBINANT, (FORTEO [place])    Inject 20 mg into the skin daily.    TESTOSTERONE CYPIONATE (DEPOTESTOTERONE CYPIONATE) 200 MG/ML INJECTION    Inject 1 mL (200 mg total) into the muscle every 14 (fourteen) days.  Modified Medications   No medications on file  Discontinued Medications   No medications on file     Physical Exam: Filed Vitals:   11/02/13 0919  BP: 149/70  Pulse: 69  Temp: 97.5 F (36.4 C)  Resp: 18  Height: 6\' 4"  (1.93 m)  Weight: 310 lb (140.615 kg)  SpO2: 98%     General- elderly male in no acute distress Head- atraumatic, normocephalic Eyes- PERRLA, EOMI, no pallor, no icterus Neck- no lymphadenopathy Cardiovascular- normal s1,s2, no murmurs/ rubs/ gallops Respiratory- bilateral clear to auscultation, no wheeze, no rhonchi, no crackles Abdomen- bowel sounds present, soft, non tender Musculoskeletal- able to move all 4 extremities, left wrist cast in place, able to move his fingers, good circulation.  Neurological- no focal deficit, normal reflexes, normal muscle strength, normal sensation to fine touch and vibration Psychiatry- alert and oriented to person, place and time, normal mood and affect   Labs reviewed: Basic Metabolic Panel:  Recent Labs  10/24/13 1135 10/26/13 1258 10/27/13 0727 10/28/13 0557  NA 136* 133* 133* 135*  K 4.4 5.1 4.3 4.4  CL 97  --  99 99  CO2 25  --  25 25  GLUCOSE 106* 125* 134* 131*  BUN 17  --  17 15  CREATININE 0.97  --  0.96 0.86  CALCIUM 8.7  --  7.5* 7.9*   Liver Function Tests:  Recent Labs  02/07/13 0610 02/08/13 0431 02/09/13 0530  AST 12 11 12   ALT 12 10 11   ALKPHOS 154* 147* 145*  BILITOT 0.4 0.4 0.4  PROT 5.6* 5.7* 5.8*  ALBUMIN 2.5* 2.5* 2.5*   No results found for this basename: LIPASE, AMYLASE,  in the last 8760 hours No results found for this basename: AMMONIA,  in the last 8760 hours CBC:  Recent Labs  07/03/13 0720  10/24/13 1135 10/26/13 1258 10/27/13 0727 10/28/13 0557  WBC 5.6  < > 9.0  --  7.8 7.9  NEUTROABS 3.0  --   --   --   --   --  HGB 12.0*  < > 9.5* 10.2* 8.4* 8.1*  HCT 38.5*  < > 30.4* 30.0* 26.3* 26.1*  MCV 76.4*  < > 81.1  --  82.7 84.5  PLT 268  < > 432*  --  291 300  < > = values in this interval not displayed. Cardiac Enzymes:  Recent Labs  02/21/13 2146 02/22/13 0455 02/22/13 1009  TROPONINI <0.30 <0.30 <0.30   BNP: No components found with this basename: POCBNP,  CBG:  Recent Labs  02/07/13 0643 02/07/13 1123 02/07/13 1614   GLUCAP 141* 103* 122*    Assessment/Plan  Left wrist fracture- s/p ORIF. Has a cast in place and is non weight beaqring in left hand. To follow with orthopedic. Continue his pain meds, stool softener and to work with therapy team  Right hip OA- s/p total hip revision and on doxycycline course. Unclear about stop date for antibiotic as not mentioned on d/c summary. Will have it until follow up with orthopedic for now. Continue florastor. Remains afebrile. To work with therapy team for gait training. Fall precautions. Monitor incision site for signs of infection. Continue oxycodone prn and robaxin prn for pain and muscle spasticity. Continue xarelto for dvt prophylaxis  Anemia- recheck cbc. cotninue iron sulfate  afib- rate controlled. Continue amiodarone 100 mg daily with diltiazem 240 mg daily for rate control.  continue xarelto for stroke prophylaxis.   Hyperlipidemia- continue lipitor 80 mg daily  Constipation- continue the current dose of docusate and miralax, is on narcotics  hypothyrodism- continue levothyroxine 125 mcg daily  Osteoporosis- continue forteo and calcium supplement with vitamin d  Anxiety- will continue the klonopin  Depression- continue cymbalta 60 mg daily with klonopin for his nerves  CAD- remains chest pain free. Continue ranexa   BPH- continue flomax for now  F/u with dr Alvan Dame and dr Amedeo Plenty in 2 weeks  D/c enbrel as patient refusing to have it   Family/ staff Communication: reviewed care plan with patient and nursing supervisor   Goals of care: home after STR   Labs/tests ordered- cbc, cmp

## 2013-11-15 ENCOUNTER — Encounter (HOSPITAL_COMMUNITY): Payer: Self-pay | Admitting: *Deleted

## 2013-11-15 ENCOUNTER — Other Ambulatory Visit: Payer: Self-pay | Admitting: Orthopedic Surgery

## 2013-11-15 ENCOUNTER — Inpatient Hospital Stay (HOSPITAL_COMMUNITY)
Admission: RE | Admit: 2013-11-15 | Discharge: 2013-11-19 | DRG: 909 | Disposition: A | Discharge status: Skilled Nursing Facility | Payer: Medicare Other | Source: Ambulatory Visit | Attending: Orthopedic Surgery | Admitting: Orthopedic Surgery

## 2013-11-15 DIAGNOSIS — I10 Essential (primary) hypertension: Secondary | ICD-10-CM | POA: Diagnosis present

## 2013-11-15 DIAGNOSIS — F329 Major depressive disorder, single episode, unspecified: Secondary | ICD-10-CM | POA: Diagnosis present

## 2013-11-15 DIAGNOSIS — G4730 Sleep apnea, unspecified: Secondary | ICD-10-CM | POA: Diagnosis present

## 2013-11-15 DIAGNOSIS — Z79899 Other long term (current) drug therapy: Secondary | ICD-10-CM

## 2013-11-15 DIAGNOSIS — Z87891 Personal history of nicotine dependence: Secondary | ICD-10-CM

## 2013-11-15 DIAGNOSIS — I4891 Unspecified atrial fibrillation: Secondary | ICD-10-CM | POA: Diagnosis present

## 2013-11-15 DIAGNOSIS — E291 Testicular hypofunction: Secondary | ICD-10-CM | POA: Diagnosis present

## 2013-11-15 DIAGNOSIS — IMO0002 Reserved for concepts with insufficient information to code with codable children: Principal | ICD-10-CM | POA: Diagnosis present

## 2013-11-15 DIAGNOSIS — K219 Gastro-esophageal reflux disease without esophagitis: Secondary | ICD-10-CM | POA: Diagnosis present

## 2013-11-15 DIAGNOSIS — L24A9 Irritant contact dermatitis due friction or contact with other specified body fluids: Secondary | ICD-10-CM | POA: Diagnosis present

## 2013-11-15 DIAGNOSIS — Z87311 Personal history of (healed) other pathological fracture: Secondary | ICD-10-CM

## 2013-11-15 DIAGNOSIS — M25559 Pain in unspecified hip: Secondary | ICD-10-CM | POA: Diagnosis present

## 2013-11-15 DIAGNOSIS — M1990 Unspecified osteoarthritis, unspecified site: Secondary | ICD-10-CM | POA: Diagnosis present

## 2013-11-15 DIAGNOSIS — M949 Disorder of cartilage, unspecified: Secondary | ICD-10-CM

## 2013-11-15 DIAGNOSIS — F3289 Other specified depressive episodes: Secondary | ICD-10-CM | POA: Diagnosis present

## 2013-11-15 DIAGNOSIS — E785 Hyperlipidemia, unspecified: Secondary | ICD-10-CM | POA: Diagnosis present

## 2013-11-15 DIAGNOSIS — E039 Hypothyroidism, unspecified: Secondary | ICD-10-CM | POA: Diagnosis present

## 2013-11-15 DIAGNOSIS — Z8249 Family history of ischemic heart disease and other diseases of the circulatory system: Secondary | ICD-10-CM

## 2013-11-15 DIAGNOSIS — M899 Disorder of bone, unspecified: Secondary | ICD-10-CM | POA: Diagnosis present

## 2013-11-15 DIAGNOSIS — Z96649 Presence of unspecified artificial hip joint: Secondary | ICD-10-CM

## 2013-11-15 DIAGNOSIS — Z96659 Presence of unspecified artificial knee joint: Secondary | ICD-10-CM

## 2013-11-15 DIAGNOSIS — Y831 Surgical operation with implant of artificial internal device as the cause of abnormal reaction of the patient, or of later complication, without mention of misadventure at the time of the procedure: Secondary | ICD-10-CM | POA: Diagnosis present

## 2013-11-15 DIAGNOSIS — I2510 Atherosclerotic heart disease of native coronary artery without angina pectoris: Secondary | ICD-10-CM | POA: Diagnosis present

## 2013-11-15 DIAGNOSIS — E7800 Pure hypercholesterolemia, unspecified: Secondary | ICD-10-CM | POA: Diagnosis present

## 2013-11-15 DIAGNOSIS — Z7901 Long term (current) use of anticoagulants: Secondary | ICD-10-CM

## 2013-11-15 DIAGNOSIS — F411 Generalized anxiety disorder: Secondary | ICD-10-CM | POA: Diagnosis present

## 2013-11-15 DIAGNOSIS — T148XXA Other injury of unspecified body region, initial encounter: Secondary | ICD-10-CM | POA: Diagnosis present

## 2013-11-15 DIAGNOSIS — M810 Age-related osteoporosis without current pathological fracture: Secondary | ICD-10-CM | POA: Diagnosis present

## 2013-11-15 DIAGNOSIS — Z6837 Body mass index (BMI) 37.0-37.9, adult: Secondary | ICD-10-CM

## 2013-11-15 LAB — CBC
HCT: 19.1 % — ABNORMAL LOW (ref 39.0–52.0)
Hemoglobin: 5.7 g/dL — CL (ref 13.0–17.0)
MCH: 24.4 pg — AB (ref 26.0–34.0)
MCHC: 29.8 g/dL — ABNORMAL LOW (ref 30.0–36.0)
MCV: 81.6 fL (ref 78.0–100.0)
PLATELETS: 451 10*3/uL — AB (ref 150–400)
RBC: 2.34 MIL/uL — ABNORMAL LOW (ref 4.22–5.81)
RDW: 18.1 % — AB (ref 11.5–15.5)
WBC: 7.5 10*3/uL (ref 4.0–10.5)

## 2013-11-15 LAB — BASIC METABOLIC PANEL
BUN: 21 mg/dL (ref 6–23)
CHLORIDE: 96 meq/L (ref 96–112)
CO2: 24 mEq/L (ref 19–32)
Calcium: 8.5 mg/dL (ref 8.4–10.5)
Creatinine, Ser: 1.1 mg/dL (ref 0.50–1.35)
GFR calc Af Amer: 78 mL/min — ABNORMAL LOW (ref >90)
GFR calc non Af Amer: 67 mL/min — ABNORMAL LOW (ref >90)
GLUCOSE: 137 mg/dL — AB (ref 70–99)
POTASSIUM: 5 meq/L (ref 3.7–5.3)
Sodium: 132 mEq/L — ABNORMAL LOW (ref 137–147)

## 2013-11-15 LAB — PREPARE RBC (CROSSMATCH)

## 2013-11-15 MED ORDER — POLYVINYL ALCOHOL 1.4 % OP SOLN: 1.0000 [drp] | OPHTHALMIC | Status: DC | PRN

## 2013-11-15 MED ORDER — ONDANSETRON HCL 4 MG/2ML IJ SOLN: 4.0000 mg | Freq: Four times a day (QID) | INTRAMUSCULAR | Status: DC | PRN

## 2013-11-15 MED ORDER — OXYCODONE HCL 5 MG PO TABS
5.0000 mg | ORAL_TABLET | ORAL | Status: DC | PRN
  Administered 2013-11-15 – 2013-11-16 (×3): 15 mg via ORAL
  Administered 2013-11-16 – 2013-11-17 (×5): 10 mg via ORAL
  Administered 2013-11-18: 5 mg via ORAL
  Administered 2013-11-18 (×2): 10 mg via ORAL
  Administered 2013-11-18: 15 mg via ORAL
  Administered 2013-11-18: 10 mg via ORAL
  Administered 2013-11-18 (×2): 5 mg via ORAL
  Administered 2013-11-19: 10 mg via ORAL
  Administered 2013-11-19: 15 mg via ORAL
  Administered 2013-11-19: 10 mg via ORAL
  Filled 2013-11-15: qty 2
  Filled 2013-11-15 (×2): qty 3
  Filled 2013-11-15: qty 2
  Filled 2013-11-15: qty 3
  Filled 2013-11-15 (×4): qty 2
  Filled 2013-11-15 (×2): qty 3
  Filled 2013-11-15: qty 2
  Filled 2013-11-15: qty 3
  Filled 2013-11-15 (×4): qty 2

## 2013-11-15 MED ORDER — VANCOMYCIN HCL 10 G IV SOLR
2500.0000 mg | Freq: Once | INTRAVENOUS | Status: AC
  Administered 2013-11-15: 2500 mg via INTRAVENOUS
  Filled 2013-11-15: qty 2500

## 2013-11-15 MED ORDER — ATORVASTATIN CALCIUM 80 MG PO TABS
80.0000 mg | ORAL_TABLET | Freq: Every evening | ORAL | Status: DC
  Administered 2013-11-15 – 2013-11-18 (×3): 80 mg via ORAL
  Filled 2013-11-15 (×5): qty 1

## 2013-11-15 MED ORDER — DOCUSATE SODIUM 100 MG PO CAPS
100.0000 mg | ORAL_CAPSULE | Freq: Two times a day (BID) | ORAL | Status: DC
  Administered 2013-11-15 – 2013-11-18 (×6): 100 mg via ORAL

## 2013-11-15 MED ORDER — LEVOTHYROXINE SODIUM 125 MCG PO TABS
250.0000 ug | ORAL_TABLET | Freq: Every day | ORAL | Status: DC
  Administered 2013-11-17 – 2013-11-19 (×3): 250 ug via ORAL
  Filled 2013-11-15 (×5): qty 2

## 2013-11-15 MED ORDER — RANOLAZINE ER 500 MG PO TB12
500.0000 mg | ORAL_TABLET | Freq: Two times a day (BID) | ORAL | Status: DC
  Administered 2013-11-15 – 2013-11-19 (×8): 500 mg via ORAL
  Filled 2013-11-15 (×9): qty 1

## 2013-11-15 MED ORDER — POLYETHYLENE GLYCOL 3350 17 G PO PACK: 17.0000 g | PACK | Freq: Every day | ORAL | Status: DC | PRN

## 2013-11-15 MED ORDER — ONDANSETRON HCL 4 MG PO TABS: 4.0000 mg | ORAL_TABLET | Freq: Four times a day (QID) | ORAL | Status: DC | PRN

## 2013-11-15 MED ORDER — DILTIAZEM HCL ER COATED BEADS 240 MG PO CP24
240.0000 mg | ORAL_CAPSULE | Freq: Every morning | ORAL | Status: DC
  Administered 2013-11-16 – 2013-11-19 (×4): 240 mg via ORAL
  Filled 2013-11-15 (×4): qty 1

## 2013-11-15 MED ORDER — HYDROMORPHONE HCL PF 1 MG/ML IJ SOLN: 0.5000 mg | INTRAMUSCULAR | Status: DC | PRN

## 2013-11-15 MED ORDER — RIVAROXABAN 20 MG PO TABS: 20.0000 mg | ORAL_TABLET | Freq: Every day | ORAL | Status: DC

## 2013-11-15 MED ORDER — BISACODYL 10 MG RE SUPP: 10.0000 mg | Freq: Every day | RECTAL | Status: DC | PRN

## 2013-11-15 MED ORDER — NITROGLYCERIN 0.4 MG SL SUBL: 0.4000 mg | SUBLINGUAL_TABLET | SUBLINGUAL | Status: DC | PRN

## 2013-11-15 MED ORDER — SODIUM CHLORIDE 0.9 % IV SOLN
INTRAVENOUS | Status: DC
  Administered 2013-11-15 – 2013-11-19 (×6): via INTRAVENOUS

## 2013-11-15 MED ORDER — FLEET ENEMA 7-19 GM/118ML RE ENEM: 1.0000 | ENEMA | Freq: Once | RECTAL | Status: AC | PRN

## 2013-11-15 MED ORDER — CLONAZEPAM 1 MG PO TABS
1.0000 mg | ORAL_TABLET | Freq: Three times a day (TID) | ORAL | Status: DC
  Administered 2013-11-15 – 2013-11-19 (×10): 1 mg via ORAL
  Filled 2013-11-15 (×10): qty 1

## 2013-11-15 MED ORDER — ZOLPIDEM TARTRATE 5 MG PO TABS: 5.0000 mg | ORAL_TABLET | Freq: Every evening | ORAL | Status: DC | PRN

## 2013-11-15 MED ORDER — TAMSULOSIN HCL 0.4 MG PO CAPS
0.4000 mg | ORAL_CAPSULE | Freq: Every evening | ORAL | Status: DC
  Administered 2013-11-15 – 2013-11-18 (×3): 0.4 mg via ORAL
  Filled 2013-11-15 (×5): qty 1

## 2013-11-15 MED ORDER — SACCHAROMYCES BOULARDII 250 MG PO CAPS
250.0000 mg | ORAL_CAPSULE | Freq: Every day | ORAL | Status: DC
  Administered 2013-11-17 – 2013-11-19 (×3): 250 mg via ORAL
  Filled 2013-11-15 (×5): qty 1

## 2013-11-15 MED ORDER — VANCOMYCIN HCL 10 G IV SOLR
1250.0000 mg | Freq: Two times a day (BID) | INTRAVENOUS | Status: DC
  Administered 2013-11-16 – 2013-11-19 (×5): 1250 mg via INTRAVENOUS
  Filled 2013-11-15 (×8): qty 1250

## 2013-11-15 MED ORDER — LORATADINE 10 MG PO TABS
10.0000 mg | ORAL_TABLET | Freq: Every day | ORAL | Status: DC
  Administered 2013-11-17 – 2013-11-19 (×3): 10 mg via ORAL
  Filled 2013-11-15 (×4): qty 1

## 2013-11-15 MED ORDER — PANTOPRAZOLE SODIUM 40 MG PO TBEC
40.0000 mg | DELAYED_RELEASE_TABLET | Freq: Every day | ORAL | Status: DC
  Administered 2013-11-17 – 2013-11-19 (×3): 40 mg via ORAL
  Filled 2013-11-15 (×4): qty 1

## 2013-11-15 MED ORDER — METHOCARBAMOL 500 MG PO TABS
500.0000 mg | ORAL_TABLET | Freq: Four times a day (QID) | ORAL | Status: DC | PRN
  Administered 2013-11-15 – 2013-11-16 (×2): 500 mg via ORAL
  Filled 2013-11-15 (×2): qty 1

## 2013-11-15 MED ORDER — TESTOSTERONE CYPIONATE 200 MG/ML IM SOLN: 200.0000 mg | INTRAMUSCULAR | Status: DC

## 2013-11-15 MED ORDER — ALUM & MAG HYDROXIDE-SIMETH 200-200-20 MG/5ML PO SUSP: 30.0000 mL | Freq: Four times a day (QID) | ORAL | Status: DC | PRN

## 2013-11-15 MED ORDER — POLYETHYLENE GLYCOL 400 0.25 % OP SOLN: 1.0000 [drp] | Freq: Two times a day (BID) | OPHTHALMIC | Status: DC | PRN

## 2013-11-15 MED ORDER — AMIODARONE HCL 100 MG PO TABS
100.0000 mg | ORAL_TABLET | Freq: Every day | ORAL | Status: DC
  Administered 2013-11-16 – 2013-11-19 (×4): 100 mg via ORAL
  Filled 2013-11-15 (×4): qty 1

## 2013-11-15 MED ORDER — DULOXETINE HCL 60 MG PO CPEP
60.0000 mg | ORAL_CAPSULE | Freq: Two times a day (BID) | ORAL | Status: DC
  Administered 2013-11-15 – 2013-11-19 (×7): 60 mg via ORAL
  Filled 2013-11-15 (×9): qty 1

## 2013-11-15 NOTE — Progress Notes (Signed)
CRITICAL VALUE ALERT  Critical value received: hgb 5.7   Date of notification:  11/15/13 Time of notification:  1823  Critical value read back:yes  Nurse who received alert:  Gerilyn Nestle RN  MD notified (1st page):   Marissa Nestle PA  Time of first page:  1823  MD notified (2nd page):  Time of second page:  Responding MD:   Time MD responded:  [phone]

## 2013-11-15 NOTE — Progress Notes (Signed)
58 Spoke with nurse Shermaine and gave pre   Instructions no food after midnight.  May have clear liquids until 9 am 11-16-2013 then NPO for surgery. Take the following medications the morning of surgery with a sip of water: Pacerone, Cardizem, Synthroid, Cymbalta, Prilosec and Ranolazine (Ranexa) Take a shower the night before surgery and the morning of surgery with chlorahexidine do not wash your face or your perineal area with the solution, follow instructions on the [facility]-Preparing For Surgery Sheet.

## 2013-11-15 NOTE — Progress Notes (Signed)
ANTIBIOTIC CONSULT NOTE - INITIAL  Pharmacy Consult for vancomycin Indication: right hip wound  No Known Allergies  Patient Measurements: Height: 6\' 4"  (193 cm) Weight: 310 lb (140.615 kg) IBW/kg (Calculated) : 86.8  Vital Signs: Temp: 98.4 F (36.9 C) (02/05 1709) BP: 159/66 mmHg (02/05 1709) Pulse Rate: 85 (02/05 1709) Intake/Output from previous day:   Intake/Output from this shift:    Labs:  Recent Labs  11/15/13 1720  WBC 7.5  HGB 5.7*  PLT 451*  CREATININE 1.10   Estimated Creatinine Clearance: 98.5 ml/min (by C-G formula based on Cr of 1.1). No results found for this basename: VANCOTROUGH, Corlis Leak, VANCORANDOM, GENTTROUGH, GENTPEAK, GENTRANDOM, TOBRATROUGH, TOBRAPEAK, TOBRARND, AMIKACINPEAK, AMIKACINTROU, AMIKACIN,  in the last 72 hours   Microbiology: Recent Results (from the past 720 hour(s))  SURGICAL PCR SCREEN     Status: None   Collection Time    10/24/13 11:12 AM      Result Value Range Status   MRSA, PCR NEGATIVE  NEGATIVE Final   Staphylococcus aureus NEGATIVE  NEGATIVE Final   Comment:            The Xpert SA Assay (FDA     approved for NASAL specimens     in patients over 32 years of age),     is one component of     a comprehensive surveillance     program.  Test performance has     been validated by [employer] for patients greater     than or equal to 74 year old.     It is not intended     to diagnose infection nor to     guide or monitor treatment.  URINE CULTURE     Status: None   Collection Time    10/24/13 11:12 AM      Result Value Range Status   Specimen Description URINE, RANDOM   Final   Special Requests NONE   Final   Culture  Setup Time     Final   Value: 10/24/2013 14:28     Performed at [facility]     Final   Value: >=100,000 COLONIES/ML     Performed at [facility]   Culture     Final   Value: PROTEUS MIRABILIS     Performed at [facility]   Report Status  10/26/2013 FINAL   Final   Organism ID, Bacteria PROTEUS MIRABILIS   Final    Medical History: Past Medical History  Diagnosis Date  . Acid reflux   . Thyroid disease     HX GRAVES DISEASE  . Low testosterone, possible hypogonadism 10/01/2012  . Lumbago   . High cholesterol   . Anxiety   . Depression   . CAD (coronary artery disease)   . Arthritis   . Environmental allergies   . Vertebral compression fracture     L1-wears brace  . Sleep apnea     cpap machine-settings 17  . Transfusion history     s/p 12'13 hip surgery  . A-fib     hx.- sinus rhythm now  . Fracture of wrist 10/23/13    LEFT  . Osteoporosis   . Metabolic bone disease   . Plaque psoriasis     Medications:  Scheduled:  . [START ON 11/16/2013] amiodarone  100 mg Oral Daily  . atorvastatin  80 mg Oral QPM  . clonazePAM  1 mg Oral TID  . [  START ON 11/16/2013] diltiazem  240 mg Oral q morning - 10a  . docusate sodium  100 mg Oral BID  . DULoxetine  60 mg Oral BID  . [START ON 11/16/2013] levothyroxine  250 mcg Oral QAC breakfast  . [START ON 11/16/2013] loratadine  10 mg Oral Daily  . [START ON 11/16/2013] pantoprazole  40 mg Oral Daily  . ranolazine  500 mg Oral BID  . saccharomyces boulardii  250 mg Oral Daily  . tamsulosin  0.4 mg Oral QPM  . [START ON 11/28/2013] testosterone cypionate  200 mg Intramuscular Q14 Days  . vancomycin  2,500 mg Intravenous Once   Infusions:  . sodium chloride 75 mL/hr at 11/15/13 1834   Assessment: 68 yo male discharged from [facility] on 1/20 after a s/p complex right hip revision and ORIF left wrist. Total hip replacement originally done August 2013 after patient fell and broke hip and has had multiple fractures since then and multiple hip surgeries. Patient presents today for planned irrigation and debridement of right hip tomorrow to start vancomycin per pharmacy for wound infection with drainage. Patient discharged 1/20 on doxycycline 100mg  once daily. Also note that patient is  currently on Xarelto 20mg  once daily for hx Afib - holding until it is known no more procedures are planned  Goal of Therapy:  Vancomycin trough level 15-20 mcg/ml  Plan:  1) Vancomycin 2500mg  IV x 1 then 2) Vancomycin 1250mg  IV q12   Adrian Saran, PharmD, BCPS Pager [phone] 11/15/2013 7:30 PM

## 2013-11-16 ENCOUNTER — Inpatient Hospital Stay (HOSPITAL_COMMUNITY): Payer: Medicare Other | Admitting: Certified Registered"

## 2013-11-16 ENCOUNTER — Encounter (HOSPITAL_COMMUNITY): Payer: Medicare Other | Admitting: Certified Registered"

## 2013-11-16 ENCOUNTER — Encounter (HOSPITAL_COMMUNITY)
Admission: RE | Disposition: A | Discharge status: Skilled Nursing Facility | Payer: Self-pay | Source: Ambulatory Visit | Attending: Orthopedic Surgery

## 2013-11-16 HISTORY — PX: INCISION AND DRAINAGE HIP: SHX1801

## 2013-11-16 LAB — BASIC METABOLIC PANEL
BUN: 19 mg/dL (ref 6–23)
CHLORIDE: 99 meq/L (ref 96–112)
CO2: 24 meq/L (ref 19–32)
CREATININE: 0.99 mg/dL (ref 0.50–1.35)
Calcium: 8 mg/dL — ABNORMAL LOW (ref 8.4–10.5)
GFR calc Af Amer: >90 mL/min (ref >90)
GFR calc non Af Amer: 82 mL/min — ABNORMAL LOW (ref >90)
Glucose, Bld: 113 mg/dL — ABNORMAL HIGH (ref 70–99)
Potassium: 4.2 mEq/L (ref 3.7–5.3)
Sodium: 134 mEq/L — ABNORMAL LOW (ref 137–147)

## 2013-11-16 LAB — CBC
HEMATOCRIT: 21.6 % — AB (ref 39.0–52.0)
HEMOGLOBIN: 6.7 g/dL — AB (ref 13.0–17.0)
MCH: 25.2 pg — ABNORMAL LOW (ref 26.0–34.0)
MCHC: 31 g/dL (ref 30.0–36.0)
MCV: 81.2 fL (ref 78.0–100.0)
Platelets: 391 10*3/uL (ref 150–400)
RBC: 2.66 MIL/uL — ABNORMAL LOW (ref 4.22–5.81)
RDW: 17 % — AB (ref 11.5–15.5)
WBC: 6.9 10*3/uL (ref 4.0–10.5)

## 2013-11-16 LAB — PREPARE RBC (CROSSMATCH)

## 2013-11-16 SURGERY — IRRIGATION AND DEBRIDEMENT HIP
Anesthesia: General | Site: Hip | Laterality: Right

## 2013-11-16 MED ORDER — FENTANYL CITRATE 0.05 MG/ML IJ SOLN
INTRAMUSCULAR | Status: AC
  Filled 2013-11-16: qty 2

## 2013-11-16 MED ORDER — ONDANSETRON HCL 4 MG/2ML IJ SOLN
INTRAMUSCULAR | Status: AC
  Filled 2013-11-16: qty 2

## 2013-11-16 MED ORDER — 0.9 % SODIUM CHLORIDE (POUR BTL) OPTIME
TOPICAL | Status: DC | PRN
  Administered 2013-11-16: 1000 mL

## 2013-11-16 MED ORDER — LACTATED RINGERS IV SOLN: INTRAVENOUS | Status: DC

## 2013-11-16 MED ORDER — MEPERIDINE HCL 50 MG/ML IJ SOLN
6.2500 mg | INTRAMUSCULAR | Status: DC | PRN
  Administered 2013-11-16: 6.25 mg via INTRAVENOUS

## 2013-11-16 MED ORDER — METHOCARBAMOL 100 MG/ML IJ SOLN
500.0000 mg | Freq: Once | INTRAVENOUS | Status: DC
  Administered 2013-11-16: 500 mg via INTRAVENOUS
  Filled 2013-11-16 (×2): qty 5

## 2013-11-16 MED ORDER — PROPOFOL 10 MG/ML IV BOLUS
INTRAVENOUS | Status: AC
  Filled 2013-11-16: qty 20

## 2013-11-16 MED ORDER — MIDAZOLAM HCL 2 MG/2ML IJ SOLN
INTRAMUSCULAR | Status: AC
  Filled 2013-11-16: qty 2

## 2013-11-16 MED ORDER — ONDANSETRON HCL 4 MG/2ML IJ SOLN
INTRAMUSCULAR | Status: DC | PRN
  Administered 2013-11-16: 4 mg via INTRAVENOUS

## 2013-11-16 MED ORDER — LIDOCAINE HCL (CARDIAC) 20 MG/ML IV SOLN
INTRAVENOUS | Status: AC
  Filled 2013-11-16: qty 5

## 2013-11-16 MED ORDER — SODIUM CHLORIDE 0.9 % IR SOLN
Status: DC | PRN
  Administered 2013-11-16 (×3): 3000 mL

## 2013-11-16 MED ORDER — FENTANYL CITRATE 0.05 MG/ML IJ SOLN
INTRAMUSCULAR | Status: DC | PRN
  Administered 2013-11-16 (×2): 50 ug via INTRAVENOUS
  Administered 2013-11-16: 100 ug via INTRAVENOUS

## 2013-11-16 MED ORDER — MIDAZOLAM HCL 5 MG/5ML IJ SOLN
INTRAMUSCULAR | Status: DC | PRN
  Administered 2013-11-16 (×2): 1 mg via INTRAVENOUS

## 2013-11-16 MED ORDER — METHOCARBAMOL 500 MG PO TABS
500.0000 mg | ORAL_TABLET | Freq: Four times a day (QID) | ORAL | Status: DC | PRN
  Administered 2013-11-16 – 2013-11-19 (×6): 500 mg via ORAL
  Filled 2013-11-16 (×6): qty 1

## 2013-11-16 MED ORDER — FENTANYL CITRATE 0.05 MG/ML IJ SOLN
25.0000 ug | INTRAMUSCULAR | Status: DC | PRN
Start: 2013-11-16 — End: 2013-11-16
  Administered 2013-11-16 (×2): 25 ug via INTRAVENOUS

## 2013-11-16 MED ORDER — METHOCARBAMOL 100 MG/ML IJ SOLN
500.0000 mg | Freq: Four times a day (QID) | INTRAVENOUS | Status: DC | PRN
  Filled 2013-11-16: qty 5

## 2013-11-16 MED ORDER — PROPOFOL 10 MG/ML IV BOLUS
INTRAVENOUS | Status: DC | PRN
  Administered 2013-11-16: 150 mg via INTRAVENOUS

## 2013-11-16 MED ORDER — MEPERIDINE HCL 50 MG/ML IJ SOLN
INTRAMUSCULAR | Status: AC
  Filled 2013-11-16: qty 1

## 2013-11-16 MED ORDER — FUROSEMIDE 10 MG/ML IJ SOLN
INTRAMUSCULAR | Status: DC | PRN
  Administered 2013-11-16: 20 mg via INTRAMUSCULAR

## 2013-11-16 MED ORDER — LACTATED RINGERS IV SOLN
INTRAVENOUS | Status: DC | PRN
  Administered 2013-11-16: 15:00:00 via INTRAVENOUS

## 2013-11-16 MED ORDER — PROMETHAZINE HCL 25 MG/ML IJ SOLN: 6.2500 mg | INTRAMUSCULAR | Status: DC | PRN

## 2013-11-16 MED ORDER — HYDROMORPHONE HCL PF 1 MG/ML IJ SOLN
0.5000 mg | INTRAMUSCULAR | Status: DC | PRN
  Administered 2013-11-16: 1 mg via INTRAVENOUS
  Administered 2013-11-17 (×2): 2 mg via INTRAVENOUS
  Filled 2013-11-16: qty 1
  Filled 2013-11-16 (×2): qty 2

## 2013-11-16 MED ORDER — RIFAMPIN 300 MG PO CAPS
300.0000 mg | ORAL_CAPSULE | Freq: Two times a day (BID) | ORAL | Status: DC
Start: 2013-11-17 — End: 2013-11-19
  Administered 2013-11-17 – 2013-11-19 (×5): 300 mg via ORAL
  Filled 2013-11-16 (×7): qty 1

## 2013-11-16 MED ORDER — SODIUM CHLORIDE 0.9 % IJ SOLN
10.0000 mL | INTRAMUSCULAR | Status: DC | PRN
  Administered 2013-11-19 (×4): 10 mL

## 2013-11-16 MED ORDER — SUCCINYLCHOLINE CHLORIDE 20 MG/ML IJ SOLN
INTRAMUSCULAR | Status: DC | PRN
  Administered 2013-11-16: 100 mg via INTRAVENOUS

## 2013-11-16 MED ORDER — RIVAROXABAN 20 MG PO TABS
20.0000 mg | ORAL_TABLET | Freq: Every day | ORAL | Status: DC
  Administered 2013-11-18: 20 mg via ORAL
  Filled 2013-11-16 (×2): qty 1

## 2013-11-16 SURGICAL SUPPLY — 50 items
ADH SKN CLS APL DERMABOND .7 (GAUZE/BANDAGES/DRESSINGS) ×1
BAG SPEC THK2 15X12 ZIP CLS (MISCELLANEOUS) ×1
BAG ZIPLOCK 12X15 (MISCELLANEOUS) ×3 IMPLANT
CONT SPECI 4OZ STER CLIK (MISCELLANEOUS) ×3 IMPLANT
DERMABOND ADVANCED (GAUZE/BANDAGES/DRESSINGS) ×2
DERMABOND ADVANCED .7 DNX12 (GAUZE/BANDAGES/DRESSINGS) ×1 IMPLANT
DRAPE INCISE IOBAN 85X60 (DRAPES) ×3 IMPLANT
DRAPE ORTHO SPLIT 77X108 STRL (DRAPES) ×6
DRAPE SURG 17X11 SM STRL (DRAPES) ×3 IMPLANT
DRAPE SURG ORHT 6 SPLT 77X108 (DRAPES) ×2 IMPLANT
DRAPE U-SHAPE 47X51 STRL (DRAPES) ×3 IMPLANT
DRSG AQUACEL AG ADV 3.5X10 (GAUZE/BANDAGES/DRESSINGS) ×3 IMPLANT
DRSG TEGADERM 4X4.75 (GAUZE/BANDAGES/DRESSINGS) ×6 IMPLANT
DURAPREP 26ML APPLICATOR (WOUND CARE) ×3 IMPLANT
ELECT REM PT RETURN 9FT ADLT (ELECTROSURGICAL) ×3
ELECTRODE REM PT RTRN 9FT ADLT (ELECTROSURGICAL) ×1 IMPLANT
EVACUATOR 1/8 PVC DRAIN (DRAIN) ×3 IMPLANT
GAUZE SPONGE 2X2 8PLY STRL LF (GAUZE/BANDAGES/DRESSINGS) ×1 IMPLANT
GAUZE XEROFORM 5X9 LF (GAUZE/BANDAGES/DRESSINGS) ×3 IMPLANT
GLOVE BIOGEL PI IND STRL 7.5 (GLOVE) ×1 IMPLANT
GLOVE BIOGEL PI IND STRL 8 (GLOVE) ×1 IMPLANT
GLOVE BIOGEL PI INDICATOR 7.5 (GLOVE) ×2
GLOVE BIOGEL PI INDICATOR 8 (GLOVE) ×2
GLOVE ECLIPSE 8.0 STRL XLNG CF (GLOVE) IMPLANT
GLOVE ORTHO TXT STRL SZ7.5 (GLOVE) ×6 IMPLANT
GLOVE SURG ORTHO 8.0 STRL STRW (GLOVE) ×3 IMPLANT
GOWN SPEC L3 XXLG W/TWL (GOWN DISPOSABLE) ×6 IMPLANT
GOWN STRL REUS W/TWL LRG LVL3 (GOWN DISPOSABLE) ×3 IMPLANT
HANDPIECE INTERPULSE COAX TIP (DISPOSABLE) ×3
IV NS IRRIG 3000ML ARTHROMATIC (IV SOLUTION) ×3 IMPLANT
KIT BASIN OR (CUSTOM PROCEDURE TRAY) ×3 IMPLANT
MANIFOLD NEPTUNE II (INSTRUMENTS) ×3 IMPLANT
PACK TOTAL JOINT (CUSTOM PROCEDURE TRAY) ×3 IMPLANT
PAD ABD 8X10 STRL (GAUZE/BANDAGES/DRESSINGS) ×2 IMPLANT
POSITIONER SURGICAL ARM (MISCELLANEOUS) ×3 IMPLANT
SET HNDPC FAN SPRY TIP SCT (DISPOSABLE) ×1 IMPLANT
SPONGE GAUZE 2X2 STER 10/PKG (GAUZE/BANDAGES/DRESSINGS) ×2
SPONGE GAUZE 4X4 12PLY (GAUZE/BANDAGES/DRESSINGS) ×2 IMPLANT
STAPLER VISISTAT 35W (STAPLE) ×3 IMPLANT
SUT ETHILON 2 0 PS N (SUTURE) ×6 IMPLANT
SUT MNCRL AB 4-0 PS2 18 (SUTURE) ×3 IMPLANT
SUT PDS AB 1 CTX 36 (SUTURE) ×2 IMPLANT
SUT VIC AB 1 CT1 36 (SUTURE) ×6 IMPLANT
SUT VIC AB 2-0 CT1 27 (SUTURE) ×6
SUT VIC AB 2-0 CT1 36 (SUTURE) ×2 IMPLANT
SUT VIC AB 2-0 CT1 TAPERPNT 27 (SUTURE) ×2 IMPLANT
SWAB COLLECTION DEVICE MRSA (MISCELLANEOUS) ×3 IMPLANT
TAPE CLOTH SURG 4X10 WHT LF (GAUZE/BANDAGES/DRESSINGS) ×2 IMPLANT
TOWEL OR 17X26 10 PK STRL BLUE (TOWEL DISPOSABLE) ×6 IMPLANT
TUBE ANAEROBIC SPECIMEN COL (MISCELLANEOUS) ×3 IMPLANT

## 2013-11-16 NOTE — H&P (View-Only) (Signed)
   Subjective:   Procedure(s) (LRB): IRRIGATION AND DEBRIDEMENT RIGHT HIP (Right)   Patient reports pain as mild, pain controlled. No events throughout the night. Discussed the procedure with Dr. Alvan Dame.  Risks, benefits and expectations were discussed with the patient.  Risks including but not limited to the risk of anesthesia, blood clots, nerve damage, blood vessel damage, failure of the prosthesis, infection and up to and including death.  Patient understand the risks, benefits and expectations and wishes to proceed with I&D of the right hip.   Objective:   VITALS:   Filed Vitals:   11/16/13 0549  BP: 120/61  Pulse: 81  Temp: 98 F (36.7 C)  Resp: 16    Neurovascular intact Dorsiflexion/Plantar flexion intact Incision: scant drainage No cellulitis present Compartment soft  LABS  Recent Labs  11/15/13 1720 11/16/13 0405  HGB 5.7* 6.7*  HCT 19.1* 21.6*  WBC 7.5 6.9  PLT 451* 391     Recent Labs  11/15/13 1720 11/16/13 0405  NA 132* 134*  K 5.0 4.2  BUN 21 19  CREATININE 1.10 0.99  GLUCOSE 137* 113*     Assessment/Plan:   Procedure(s) (LRB): IRRIGATION AND DEBRIDEMENT RIGHT HIP (Right)   NPO since MN. Plan for I&D per Dr. Alvan Dame later today. Ordered for additional 2 unit of blood.     [name]   PAC  11/16/2013, 8:59 AM

## 2013-11-16 NOTE — Progress Notes (Signed)
RN attempted peripheral IV insertion with success. IV RN called about PICC placement this am, and informed that patient has no IV access because his IV infiltrated this am. IV RN also informed that patient needs blood this am, and that the patient is also going to surgery.   MD/PA informed about difficulty getting IV access.

## 2013-11-16 NOTE — Preoperative (Signed)
Beta Blockers   Reason not to administer Beta Blockers:Not Applicable 

## 2013-11-16 NOTE — Progress Notes (Signed)
Clinical Social Work Department BRIEF PSYCHOSOCIAL ASSESSMENT 11/16/2013  Patient:  TASMAN, ZAPATA     Account Number:  [id number]     Admit date:  11/15/2013  Clinical Social Worker:  Lacie Scotts  Date/Time:  11/16/2013 12:54 PM  Referred by:  Physician  Date Referred:  11/16/2013 Referred for  SNF Placement   Other Referral:   Interview type:  Patient Other interview type:    PSYCHOSOCIAL DATA Living Status:  FACILITY Admitted from facility:  [place] of care:  [place] Primary support name:  Rush Farmer Primary support relationship to patient:   Degree of support available:   supportive    CURRENT CONCERNS Current Concerns  Post-Acute Placement   Other Concerns:    SOCIAL WORK ASSESSMENT / PLAN Pt is a 68 yr old gentleman admitted from [facility] for surgery today. CSW met with pt to assist with d/c planning needs. Pt plans to return to SNF following hospital d/c. CSW has contacted SNF and d/c plan has been confirmed. CSW will continue to follow to assist with d/c planning.   Assessment/plan status:  Psychosocial Support/Ongoing Assessment of Needs Other assessment/ plan:   Information/referral to community resources:   none needed    PATIENT'S/FAMILY'S RESPONSE TO PLAN OF CARE: " I'll be going back to [facility] ." Pt has had a number of surgeries. " It hasn't been easy but I'm managing. " Pt is in good spirits. He is looking forward to returning to SNF when stable for d/c.   Werner Lean LCSW [phone]

## 2013-11-16 NOTE — Anesthesia Preprocedure Evaluation (Addendum)
Anesthesia Evaluation    Airway Mallampati: II TM Distance: >3 FB Neck ROM: Full    Dental no notable dental hx. (+) Poor Dentition, Chipped, Loose and Missing   Pulmonary sleep apnea and Continuous Positive Airway Pressure Ventilation , former smoker,  breath sounds clear to auscultation  Pulmonary exam normal       Cardiovascular hypertension, Pt. on medications + CAD + dysrhythmias Atrial Fibrillation Rhythm:Regular Rate:Normal     Neuro/Psych Anxiety Depression    GI/Hepatic   Endo/Other  Hypothyroidism Morbid obesity  Renal/GU      Musculoskeletal   Abdominal   Peds  Hematology  (+) anemia ,   Anesthesia Other Findings   Reproductive/Obstetrics                          Anesthesia Physical  Anesthesia Plan  ASA: II  Anesthesia Plan: General   Post-op Pain Management:    Induction: Intravenous  Airway Management Planned: Oral ETT  Additional Equipment:   Intra-op Plan:   Post-operative Plan: Extubation in OR  Informed Consent: I have reviewed the patients History and Physical, chart, labs and discussed the procedure including the risks, benefits and alternatives for the proposed anesthesia with the patient or authorized representative who has indicated his/her understanding and acceptance.   Dental advisory given  Plan Discussed with: CRNA  Anesthesia Plan Comments:         Anesthesia Quick Evaluation                                  Anesthesia Evaluation  Patient identified by MRN, date of birth, ID band Patient awake    Reviewed: Allergy & Precautions, H&P , NPO status , Patient's Chart, lab work & pertinent test results  Airway Mallampati: II TM Distance: >3 FB Neck ROM: full    Dental  (+) Teeth Intact   Pulmonary neg pulmonary ROS, sleep apnea , former smoker,          Cardiovascular hypertension, Pt. on medications + CAD + dysrhythmias  Atrial Fibrillation     Neuro/Psych Anxiety Depression negative neurological ROS  negative psych ROS   GI/Hepatic negative GI ROS, Neg liver ROS, GERD-  ,  Endo/Other  Hypothyroidism obese  Renal/GU negative Renal ROS  negative genitourinary   Musculoskeletal negative musculoskeletal ROS (+)   Abdominal   Peds negative pediatric ROS (+)  Hematology negative hematology ROS (+)   Anesthesia Other Findings   Reproductive/Obstetrics negative OB ROS                           Anesthesia Physical  Anesthesia Plan  ASA: III  Anesthesia Plan: General   Post-op Pain Management:    Induction: Intravenous  Airway Management Planned: Oral ETT  Additional Equipment:   Intra-op Plan:   Post-operative Plan: Extubation in OR  Informed Consent: I have reviewed the patients History and Physical, chart, labs and discussed the procedure including the risks, benefits and alternatives for the proposed anesthesia with the patient or authorized representative who has indicated his/her understanding and acceptance.   Dental advisory given  Plan Discussed with: CRNA, Anesthesiologist and Surgeon  Anesthesia Plan Comments:         Anesthesia Quick Evaluation  Anesthesia Evaluation  Patient identified by MRN, date of birth, ID band Patient awake    Reviewed: Allergy & Precautions, H&P , NPO status , Patient's Chart, lab work & pertinent test results  Airway Mallampati: II TM Distance: >3 FB Neck ROM: full    Dental  (+) Teeth Intact   Pulmonary sleep apnea , former smoker,          Cardiovascular hypertension, Pt. on medications + CAD + dysrhythmias Atrial Fibrillation     Neuro/Psych PSYCHIATRIC DISORDERS Anxiety Depression negative neurological ROS     GI/Hepatic Neg liver ROS, GERD-  Medicated,  Endo/Other  Hypothyroidism obese  Renal/GU negative Renal ROS      Musculoskeletal negative musculoskeletal ROS (+)   Abdominal (+) + obese,   Peds  Hematology  (+) Blood dyscrasia, anemia ,   Anesthesia Other Findings   Reproductive/Obstetrics negative OB ROS                          Anesthesia Physical  Anesthesia Plan  ASA: III  Anesthesia Plan: General   Post-op Pain Management:    Induction: Intravenous  Airway Management Planned: LMA and Mask  Additional Equipment:   Intra-op Plan:   Post-operative Plan: Extubation in OR  Informed Consent: I have reviewed the patients History and Physical, chart, labs and discussed the procedure including the risks, benefits and alternatives for the proposed anesthesia with the patient or authorized representative who has indicated his/her understanding and acceptance.   Dental advisory given  Plan Discussed with: CRNA  Anesthesia Plan Comments:         Anesthesia Quick Evaluation

## 2013-11-16 NOTE — Progress Notes (Signed)
Peripherally Inserted Central Catheter/Midline Placement  The IV Nurse has discussed with the patient and/or persons authorized to consent for the patient, the purpose of this procedure and the potential benefits and risks involved with this procedure.  The benefits include less needle sticks, lab draws from the catheter and patient may be discharged home with the catheter.  Risks include, but not limited to, infection, bleeding, blood clot (thrombus formation), and puncture of an artery; nerve damage and irregular heat beat.  Alternatives to this procedure were also discussed.  PICC/Midline Placement Documentation  PICC / Midline Double Lumen 73/42/87 PICC Right Basilic 48 cm 1 cm (Active)  Indication for Insertion or Continuance of Line Poor Vasculature-patient has had multiple peripheral attempts or PIVs lasting less than 24 hours 11/16/2013 12:56 PM  Exposed Catheter (cm) 1 cm 11/16/2013 12:56 PM  Dressing Change Due 11/23/13 11/16/2013 12:56 PM       Poff, Mordecai Rasmussen 11/16/2013, 12:57 PM

## 2013-11-16 NOTE — Interval H&P Note (Signed)
History and Physical Interval Note:  11/16/2013 5:20 PM  [name]  has presented today for surgery, with the diagnosis of WOUND DRAINAGE RIGHT HIP  The various methods of treatment have been discussed with the patient and family. After consideration of risks, benefits and other options for treatment, the patient has consented to  Procedure(s): IRRIGATION AND DEBRIDEMENT RIGHT HIP (Right) as a surgical intervention .  The patient's history has been reviewed, patient examined, no change in status, stable for surgery.  I have reviewed the patient's chart and labs.  Questions were answered to the patient's satisfaction.     [name]

## 2013-11-16 NOTE — H&P (Signed)
ADMISSION H&P  Patient is admitted for right hip I&D of draining wound.  Subjective:  Chief Complaint:     Right hip pain / wound drainage  HPI: ALTAN BODLEY, 68 y.o. male, has a history of pain and functional disability in the right hip due to trauma.  In August of 2013 he fell, broke his hip and a total hip replacement was performed to fix the hip. In December of 2013 he fell and caused a periprosthetic fracture on the right hip as well and wrist fracture. Upon return to the clinic it was noted that he had almost no boney ingrowth of the prosthesis. He was worked up for infection. It was decided to revise the hip components. While waiting to do hip surgery he rebroke his ankle through a plate and screws holding a previous fracture in place. He has also been diagnosed with compression fractures of the lumbar spine. Through all of the fractures he has sustained he saw an endocrinologist that diagnosed him with Metabolic Bone Disease. He had a revision of the right hip and was doing well, he then returned to the clinic and was brought to the OR to reduce the right hip after dislocating the hip. After multiple attempts the hip remained unstable and would not stay reduced. On 10/27/2013 he underwent a revision of the total hip arthroplasty.  Recently he has returned to the clinic and had drainage from the middle of the lateral incision.  The drainage appears to be serosanguinous in nature, but has been persistent lately.  Options were discussed and it was decided to bring him back to the OR for an I&D of the right hip wound.  Risks, benefits and expectations were discussed with the patient. Risks including but not limited to the risk of anesthesia, blood clots, nerve damage, blood vessel damage, failure of the prosthesis, infection and up to and including death. Patient understand the risks, benefits and expectations and wishes to proceed with surgery.   D/C Plans:   SNF  Post-op Meds:   No Rx given    FYI:    TDWB for transfers on the right leg  Oxycodone for pain  ASA post-op     Patient Active Problem List   Diagnosis Date Noted  . Wound drainage 11/15/2013  . Expected blood loss anemia 09/18/2013  . Hyponatremia 09/18/2013  . S/P right TH revision 09/17/2013  . Nonunion, fracture, Right tibia  07/03/2013  . UTI (urinary tract infection) 07/03/2013  . Pathological fracture due to osteoporosis with delayed healing 04/24/2013  . Osteoporosis with fracture 04/24/2013  . Rash and nonspecific skin eruption 03/08/2013  . Syncope 02/21/2013  . PAF (paroxysmal atrial fibrillation) 02/21/2013  . Chronic anticoagulation 02/21/2013  . Fall 02/08/2013  . Physical deconditioning 02/08/2013  . Low back pain radiating to both legs 02/08/2013  . Compression fracture of L1 lumbar vertebra 02/08/2013  . Right tibial fracture 02/05/2013  . Obesity, unspecified 02/05/2013  . Osteomyelitis of right wrist 11/02/2012  . Anemia 10/06/2012  . HTN (hypertension) 10/03/2012  . Psoriasis 10/03/2012  . Dyslipidemia 10/03/2012  . Vitamin D deficiency 10/03/2012  . GERD (gastroesophageal reflux disease) 10/03/2012  . Hyperglycemia 10/03/2012  . Anxiety 10/03/2012  . Depression 10/03/2012  . CAD (coronary artery disease) 10/03/2012  . DJD (degenerative joint disease) 10/03/2012  . Chronic gingivitis 10/03/2012  . Low testosterone, possible hypogonadism 10/01/2012  . Normocytic anemia 09/30/2012  . Right femoral fracture 09/29/2012  . Right forearm fracture 09/29/2012  . Atrial  fibrillation 09/29/2012  . Hypothyroidism 09/29/2012  . History of recurrent UTIs 09/29/2012   Past Medical History  Diagnosis Date  . Acid reflux   . Thyroid disease     HX GRAVES DISEASE  . Low testosterone, possible hypogonadism 10/01/2012  . Lumbago   . High cholesterol   . Anxiety   . Depression   . CAD (coronary artery disease)   . Arthritis   . Environmental allergies   . Vertebral compression  fracture     L1-wears brace  . Sleep apnea     cpap machine-settings 17  . Transfusion history     s/p 12'13 hip surgery  . A-fib     hx.- sinus rhythm now  . Fracture of wrist 10/23/13    LEFT  . Osteoporosis   . Metabolic bone disease   . Plaque psoriasis     Past Surgical History  Procedure Laterality Date  . Knee surgery Bilateral 2012    Total knee replacements  . Appendectomy    . Right femur surgery  05/2012  . Orif wrist fracture  10/02/2012    Procedure: OPEN REDUCTION INTERNAL FIXATION (ORIF) WRIST FRACTURE;  Surgeon: Roseanne Kaufman, MD;  Location: [facility];  Service: Orthopedics;  Laterality: Right;  WITH   ANTIBIOTIC  CEMENT  . Total hip revision  10/05/2012    Procedure: TOTAL HIP REVISION;  Surgeon: [name], MD;  Location: [facility];  Service: Orthopedics;  Laterality: Right;  RIGHT TOTAL HIP REVISION  . Hardware removal  10/05/2012    Procedure: HARDWARE REMOVAL;  Surgeon: [name], MD;  Location: [facility];  Service: Orthopedics;  Laterality: Right;  REMOVING  STRYKER  GAMMA NAIL  . Wrist fracture surgery  05/2012  . Orif tibia fracture Right 02/06/2013    Procedure: OPEN REDUCTION INTERNAL FIXATION (ORIF) TIBIA FRACTURE WITH IM ROD FIBULA;  Surgeon: Rozanna Box, MD;  Location: [facility];  Service: Orthopedics;  Laterality: Right;  . Cardiac catheterization  2013  . Orif tibia fracture Right 07/03/2013    Procedure: RIGHT TIBIA NON UNION REPAIR ;  Surgeon: Rozanna Box, MD;  Location: [facility];  Service: Orthopedics;  Laterality: Right;  . Hardware removal Right 07/03/2013    Procedure: HARDWARE REMOVAL RIGHT TIBIA ;  Surgeon: Rozanna Box, MD;  Location: [facility];  Service: Orthopedics;  Laterality: Right;  . Joint replacement      hip-right x2  . Tonsillectomy    . Total hip revision Right 09/17/2013    Procedure: REVISION RIGHT TOTAL HIP ARTHROPLASTY ;  Surgeon: [name], MD;  Location: [facility];  Service: Orthopedics;  Laterality: Right;  . Hip closed  reduction Right 10/15/2013    Procedure: CLOSED MANIPULATION HIP;  Surgeon: [name], MD;  Location: [facility];  Service: Orthopedics;  Laterality: Right;  . Total hip revision Right 10/26/2013    Procedure: REVISION RIGHT TOTAL HIP ARTHROPLASTY;  Surgeon: [name], MD;  Location: [facility];  Service: Orthopedics;  Laterality: Right;  . Orif wrist fracture Left 10/28/2013    Procedure: OPEN REDUCTION INTERNAL FIXATION (ORIF) WRIST FRACTURE with allograft;  Surgeon: Roseanne Kaufman, MD;  Location: [facility];  Service: Orthopedics;  Laterality: Left;  DVR Plate    Prescriptions prior to admission  Medication Sig Dispense Refill  . amiodarone (PACERONE) 100 MG tablet Take 100 mg by mouth daily.      . Ascorbic Acid (VITAMIN C) 1000 MG tablet Take 1,000 mg by mouth  daily.      . atorvastatin (LIPITOR) 80 MG tablet Take 80 mg by mouth every evening.       . Cholecalciferol (VITAMIN D3) 5000 UNITS CAPS Take 5,000 Units by mouth daily.      . ciprofloxacin (CIPRO) 500 MG tablet Take 500 mg by mouth 2 (two) times daily.      . clonazePAM (KLONOPIN) 1 MG tablet Take one tablet by mouth three times daily  90 tablet  5  . diltiazem (CARDIZEM CD) 240 MG 24 hr capsule Take 240 mg by mouth every morning.       . docusate sodium 100 MG CAPS Take 100 mg by mouth 2 (two) times daily.  10 capsule  0  . DULoxetine (CYMBALTA) 60 MG capsule Take 60 mg by mouth 2 (two) times daily.      . ferrous sulfate 325 (65 FE) MG tablet Take 325 mg by mouth 2 (two) times daily with a meal.      . levothyroxine (SYNTHROID, LEVOTHROID) 125 MCG tablet Take 250 mcg by mouth daily before breakfast.      . loratadine (CLARITIN) 10 MG tablet Take 10 mg by mouth daily.      . methocarbamol (ROBAXIN) 500 MG tablet Take 1 tablet (500 mg total) by mouth every 6 (six) hours as needed for muscle spasms.  50 tablet  0  . Multiple Vitamin (MULTIVITAMIN WITH MINERALS) TABS tablet Take 1 tablet by mouth daily.      . nitroGLYCERIN (NITROSTAT)  0.4 MG SL tablet Place 0.4 mg under the tongue every 5 (five) minutes as needed for chest pain. x3 doses as needed for chest pain      . omeprazole (PRILOSEC) 20 MG capsule Take 20 mg by mouth daily.      [name] oxyCODONE (OXY IR/ROXICODONE) 5 MG immediate release tablet Take 5-15 mg by mouth every 4 (four) hours as needed for severe pain (5mg  pain level 1-4, 10mg  pain level 5-7, 15 mg pain level 8-10).      Ethelda Chick (OYSTER CALCIUM) 500 MG TABS tablet Take 500 mg of elemental calcium by mouth 2 (two) times daily.      . polyethylene glycol (MIRALAX / GLYCOLAX) packet Take 17 g by mouth daily as needed. Constipation.      . Polyethylene Glycol 400 (BLINK TEARS) 0.25 % SOLN Apply 1 drop to eye 2 (two) times daily as needed (dry eyes).      . ranolazine (RANEXA) 500 MG 12 hr tablet Take 500 mg by mouth 2 (two) times daily.      . Rivaroxaban (XARELTO) 20 MG TABS Take 20 mg by mouth daily with supper.      . saccharomyces boulardii (FLORASTOR) 250 MG capsule Take 250 mg by mouth daily.      [name] sulfamethoxazole-trimethoprim (BACTRIM DS) 800-160 MG per tablet Take 1 tablet by mouth daily.      . Tamsulosin HCl (FLOMAX) 0.4 MG CAPS Take 0.4 mg by mouth every evening.       . Teriparatide, Recombinant, (FORTEO Mulhall) Inject 20 mg into the skin daily.       . TESTOSTERONE TD Place 200 mg onto the skin every 14 (fourteen) days.      [name] tuberculin 5 UNIT/0.1ML injection Inject 5 Units into the skin once.      . etanercept (ENBREL SURECLICK) 50 MG/ML injection Inject 50 mg into the skin once a week. On hold. Next dose due 11/30/13  No Known Allergies   History  Substance Use Topics  . Smoking status: Former Smoker    Quit date: 06/05/2012  . Smokeless tobacco: Never Used  . Alcohol Use: Yes     Comment: occasional-social    Family History  Problem Relation Age of Onset  . CAD Father 84  . Arthritis Mother      Review of Systems  Constitutional: Positive for malaise/fatigue.  HENT: Negative.    Eyes: Negative.   Respiratory: Negative.   Cardiovascular: Negative.   Gastrointestinal: Positive for heartburn.  Genitourinary: Negative.   Musculoskeletal: Positive for back pain, falls and joint pain.  Skin: Positive for rash.  Neurological: Negative.   Endo/Heme/Allergies: Negative.   Psychiatric/Behavioral: Positive for depression. The patient is nervous/anxious.     Objective:  Physical Exam  Constitutional: He is oriented to person, place, and time. He appears well-developed and well-nourished.  HENT:  Head: Normocephalic and atraumatic.  Eyes: Pupils are equal, round, and reactive to light.  Neck: Neck supple.  Cardiovascular: Normal rate and intact distal pulses.   Respiratory: Effort normal and breath sounds normal. No respiratory distress. He has no wheezes.  GI: Soft. There is no tenderness. There is no guarding.  Musculoskeletal:       Right hip: He exhibits decreased range of motion, decreased strength, tenderness, bony tenderness, swelling, deformity and laceration (with drainage from the middle portion of the incision.). He exhibits no crepitus.  Neurological: He is alert and oriented to person, place, and time.  Skin: Skin is warm.  Psychiatric: He has a normal mood and affect.    Vital signs in last 24 hours: Temp:  [97.2 F (36.2 C)-98.5 F (36.9 C)] 97.7 F (36.5 C) (02/06 1328) Pulse Rate:  [70-85] 71 (02/06 1328) Resp:  [16-20] 16 (02/06 1328) BP: (117-145)/(47-69) 126/66 mmHg (02/06 1328) SpO2:  [94 %-97 %] 96 % (02/06 1253)  Labs:  Estimated body mass index is 37.75 kg/(m^2) as calculated from the following:   Height as of this encounter: 6\' 4"  (1.93 m).   Weight as of this encounter: 140.615 kg (310 lb).   Imaging Review Plain radiographs demonstrate previous total hip arthroplasty of the right hip(s). The bone quality appears to be fair for age and reported activity level.  Assessment/Plan:  Right hip pain / wound drainage  The patient  history, physical examination, clinical judgement of the provider and imaging studies are consistent with drainage of surgical incision after previous right  total hip arthroplasty and I&D of the wound is deemed medically necessary.   Risks, benefits and expectations were discussed with the patient.  Risks including but not limited to the risk of anesthesia, blood clots, nerve damage, blood vessel damage, failure of the prosthesis, infection and up to and including death.  Patient understand the risks, benefits and expectations and wishes to proceed with surgery.  The patient acknowledged the explanation, agreed to proceed with the plan and consent was signed. Patient is being admitted for inpatient treatment for surgery, pain control, PT, OT, prophylactic antibiotics, VTE prophylaxis, progressive ambulation and ADL's and discharge planning.The patient is planning to be discharged to skilled nursing facility.    [place] Pugh [place] Muehl   PAC  11/16/2013, 6:01 PM

## 2013-11-16 NOTE — Progress Notes (Signed)
   Subjective:   Procedure(s) (LRB): IRRIGATION AND DEBRIDEMENT RIGHT HIP (Right)   Patient reports pain as mild, pain controlled. No events throughout the night. Discussed the procedure with Dr. Olin.  Risks, benefits and expectations were discussed with the patient.  Risks including but not limited to the risk of anesthesia, blood clots, nerve damage, blood vessel damage, failure of the prosthesis, infection and up to and including death.  Patient understand the risks, benefits and expectations and wishes to proceed with I&D of the right hip.   Objective:   VITALS:   Filed Vitals:   11/16/13 0549  BP: 120/61  Pulse: 81  Temp: 98 F (36.7 C)  Resp: 16    Neurovascular intact Dorsiflexion/Plantar flexion intact Incision: scant drainage No cellulitis present Compartment soft  LABS  Recent Labs  11/15/13 1720 11/16/13 0405  HGB 5.7* 6.7*  HCT 19.1* 21.6*  WBC 7.5 6.9  PLT 451* 391     Recent Labs  11/15/13 1720 11/16/13 0405  NA 132* 134*  K 5.0 4.2  BUN 21 19  CREATININE 1.10 0.99  GLUCOSE 137* 113*     Assessment/Plan:   Procedure(s) (LRB): IRRIGATION AND DEBRIDEMENT RIGHT HIP (Right)   NPO since MN. Plan for I&D per Dr. Olin later today. Ordered for additional 2 unit of blood.     [name] S. [name]   PAC  11/16/2013, 8:59 AM   

## 2013-11-16 NOTE — Transfer of Care (Signed)
Immediate Anesthesia Transfer of Care Note  Patient: [name]  Procedure(s) Performed: Procedure(s) (LRB): IRRIGATION AND DEBRIDEMENT RIGHT HIP (Right)  Patient Location: PACU  Anesthesia Type: General  Level of Consciousness: sedated, patient cooperative and responds to stimulation  Airway & Oxygen Therapy: Patient Spontanous Breathing and Patient connected to face mask oxgen  Post-op Assessment: Report given to PACU RN and Post -op Vital signs reviewed and stable  Post vital signs: Reviewed and stable  Complications: No apparent anesthesia complications

## 2013-11-16 NOTE — Anesthesia Postprocedure Evaluation (Signed)
  Anesthesia Post-op Note  Patient: [name]  Procedure(s) Performed: Procedure(s) (LRB): IRRIGATION AND DEBRIDEMENT RIGHT HIP (Right)  Patient Location: PACU  Anesthesia Type: General  Level of Consciousness: awake and alert   Airway and Oxygen Therapy: Patient Spontanous Breathing  Post-op Pain: mild  Post-op Assessment: Post-op Vital signs reviewed, Patient's Cardiovascular Status Stable, Respiratory Function Stable, Patent Airway and No signs of Nausea or vomiting  Last Vitals:  Filed Vitals:   11/16/13 1822  BP: 129/69  Pulse: 70  Temp: 36.4 C  Resp: 16    Post-op Vital Signs: stable   Complications: No apparent anesthesia complications

## 2013-11-16 NOTE — Brief Op Note (Signed)
11/15/2013 - 11/16/2013  5:09 PM  PATIENT:  [name]  68 y.o. male  PRE-OPERATIVE DIAGNOSIS:  WOUND DRAINAGE RIGHT HIP  POST-OPERATIVE DIAGNOSIS:  1.  Right hip hematoma with WOUND DRAINAGE RIGHT HIP  PROCEDURE:  Procedure(s): IRRIGATION AND DEBRIDEMENT RIGHT HIP (Right), evacuation of large hip hematoma  SURGEON:  Surgeon(s) and Role:    * [name], MD - Primary  PHYSICIAN ASSISTANT: Danae Orleans, PA-C  ANESTHESIA:   general  EBL:  Total I/O In: 110 [I.V.:110] Out: 700 [Urine:700]  BLOOD ADMINISTERED:250 CC PRBC, patient was found to have hgb of 5.7 on admission and has been receiving total of 4 units of PRBCs  DRAINS: (1 medium) Hemovact drain(s) in the deep in right hip with  Suction Open   LOCAL MEDICATIONS USED:  NONE  SPECIMEN:  Source of Specimen:  right hip wound  DISPOSITION OF SPECIMEN:  PATHOLOGY  COUNTS:  YES  TOURNIQUET:  * No tourniquets in log *  DICTATION: .Other Dictation: Dictation Number [id number]  PLAN OF CARE: Admit to inpatient   PATIENT DISPOSITION:  PACU - hemodynamically stable.   Delay start of Pharmacological VTE agent (>24hrs) due to surgical blood loss or risk of bleeding: yes due to bleeding risks and pre-operative anemia

## 2013-11-17 LAB — CBC WITH DIFFERENTIAL/PLATELET
BASOS PCT: 0 % (ref 0–1)
Basophils Absolute: 0 10*3/uL (ref 0.0–0.1)
EOS ABS: 0.2 10*3/uL (ref 0.0–0.7)
Eosinophils Relative: 2 % (ref 0–5)
HCT: 24.5 % — ABNORMAL LOW (ref 39.0–52.0)
Hemoglobin: 7.5 g/dL — ABNORMAL LOW (ref 13.0–17.0)
LYMPHS ABS: 0.8 10*3/uL (ref 0.7–4.0)
Lymphocytes Relative: 12 % (ref 12–46)
MCH: 25.3 pg — ABNORMAL LOW (ref 26.0–34.0)
MCHC: 30.6 g/dL (ref 30.0–36.0)
MCV: 82.8 fL (ref 78.0–100.0)
Monocytes Absolute: 0.4 10*3/uL (ref 0.1–1.0)
Monocytes Relative: 6 % (ref 3–12)
NEUTROS ABS: 5 10*3/uL (ref 1.7–7.7)
Neutrophils Relative %: 80 % — ABNORMAL HIGH (ref 43–77)
Platelets: 384 10*3/uL (ref 150–400)
RBC: 2.96 MIL/uL — ABNORMAL LOW (ref 4.22–5.81)
RDW: 16.9 % — ABNORMAL HIGH (ref 11.5–15.5)
WBC: 6.2 10*3/uL (ref 4.0–10.5)

## 2013-11-17 LAB — GRAM STAIN: GRAM STAIN: NONE SEEN

## 2013-11-17 LAB — BASIC METABOLIC PANEL
BUN: 19 mg/dL (ref 6–23)
CHLORIDE: 99 meq/L (ref 96–112)
CO2: 26 mEq/L (ref 19–32)
CREATININE: 1.02 mg/dL (ref 0.50–1.35)
Calcium: 7.8 mg/dL — ABNORMAL LOW (ref 8.4–10.5)
GFR calc Af Amer: 85 mL/min — ABNORMAL LOW (ref >90)
GFR calc non Af Amer: 73 mL/min — ABNORMAL LOW (ref >90)
GLUCOSE: 98 mg/dL (ref 70–99)
Potassium: 4.5 mEq/L (ref 3.7–5.3)
Sodium: 134 mEq/L — ABNORMAL LOW (ref 137–147)

## 2013-11-17 NOTE — Progress Notes (Signed)
PT Cancellation Note  Patient Details Name: [name] MRN: [id number] DOB: 20-Mar-1946   Cancelled Treatment:     PT eval deferred at pt request 2* fatigue.  Will follow in am.   Jathen Sudano 11/17/2013, 3:49 PM

## 2013-11-17 NOTE — Op Note (Signed)
NAMEJAVION, [name]              ACCOUNT NO.:  [id number]  MEDICAL RECORD NO.:  [id number]  LOCATION:  5                         FACILITY:  [facility]  PHYSICIAN:  Pietro Cassis. Alvan Dame, M.D.  DATE OF BIRTH:  11/04/1945  DATE OF PROCEDURE:  11/16/2013 DATE OF DISCHARGE:                              OPERATIVE REPORT   PREOPERATIVE DIAGNOSIS:  Right hip hematoma with wound drainage.  POSTOPERATIVE DIAGNOSIS:  Right hip hematoma with wound drainage.  PROCEDURE:  Nonexcisional debridement of right hip wound with evacuation of a very large hematoma probably measuring 1 L in volume at least. Irrigation of the right hip wound with 9 L of normal saline solution, as well as 500 mL combination of saline and chlorhexidine allowed to bathe in the wound for 2-5 minutes.  SURGEON:  Pietro Cassis. Alvan Dame, MD  ASSISTANT:  Danae Orleans, PA-C.  Note that [name] was present for the entirety of the case from preoperative position, perioperative management, operative extremity, general facilitation of the case, and primary wound closure.  ANESTHESIA:  General.  SPECIMENS:  I did take swab from right hip wound.  FINDINGS:  The patient was noted to have a large hematoma superficial to the gluteal fascia and iliotibial band, which was completely intact without drainage.  Upon evacuation and irrigation at this level, I then was able to palpate significant fluid versus hematoma in the hip.  I then excised the iliotibial band and evacuated a significant volume of hematoma.  Drains are placed one medium Hemovac drain deep into the hip wound.  COMPLICATIONS:  Apparent blood.  The patient did received 250 mL of packed red blood cells in the operating room.  This is in combination with three other units that was given due to a preoperative hemoglobin of 5.7.  INDICATIONS FOR PROCEDURE:  [name] is a 68 year old male with complex right hip surgery including previous proximal femur fracture with failure  and subsequent revision complicated by femoral component failure, instability requiring multiple revision surgeries.  His most recent procedure was 2-3 weeks ago.  At this point, presenting with minor wound drainage with concerns for hematoma.  He was seen, evaluated in the office and admitted to the hospital for evacuation of hematoma. He presented without fevers, chills night sweats, or purulent drainage. After reviewing with Alpha and his partner [name], in this current situation, the plan was to proceed with operative evacuation of the hematoma with I and D of the wound.  Risks, benefits, and necessity reviewed and discussed.  Consent was obtained for above.  PROCEDURE IN DETAIL:  The patient was brought to the operative theater. Once adequate anesthesia, preoperative antibiotics, including perioperative use of vancomycin had been administered.  He was positioned into the left lateral decubitus position with the right side up and the right lower extremity was then prepped and draped in sterile fashion.  Time-out was performed identifying the patient, planned procedure, and extremity.  At the time of draping, I then removed his staples retained from his procedure three weeks ago.  I then utilized an incision about 6-7 inches long, which contained within his old incision, evacuating a large amount hematoma and superficial area once this was fully  removed and debrided.  We found that the cavity was completely intact and did not have any direct communication with the deep layer. Once I evacuated and irrigated this layer with 3 L normal saline solution followed by about 200 mL of chlorhexidine solution combined with saline.  Following this, in the palpation of a larger amount of fluid or hematoma deep, I incised the iliotibial band, about a 5-inch area evacuating a significant amount of hematoma.  Total volume seemed to be at least 500 mL to a liter volume.  Once this was fully  evacuated, we again irrigated this portion of the hip with a total of 6 L normal saline solution.  After the first 3 L bag, I then placed a chlorhexidine solution into the hip joint and let it sit for 5 minutes and then irrigated again with 3 L of normal saline solution.  Following this, the wound appeared to be very stable.  There was no active oozing or bleeding at all visualized.  I placed a medium Hemovac drain deep.  This was done in an effort to prevent any recurrence of hematoma.  Collection will probably be kept in place for 1-2 days while in the hospital.  Following evacuation of hematoma and irrigation of the iliotibial band, wound was closed with #1 PDS suture interrupted horizontal mattress. The remainder wound was closed with 2-0 Vicryl and 2-0 nylon used on the skin based on the status of the skin at this early healing state.  The wound was then cleaned, dried, and dressed sterilely using Xeroform and a bulky sterile dressing.  He was brought to the recovery room in stable condition.  Findings reviewed with [name].  He will be on IV antibiotic for 6 weeks for which we have had a PICC line placed.  He will be on IV vancomycin for 6 weeks with possible rifampin or until I am certain his wound fully heals.     Pietro Cassis Alvan Dame, M.D.     MDO/MEDQ  D:  11/16/2013  T:  11/17/2013  Job:  [id number]

## 2013-11-17 NOTE — Progress Notes (Signed)
Patient ID: [name], male   DOB: Aug 09, 1946, 68 y.o.   MRN: [id number] Subjective: 1 Day Post-Op Procedure(s) (LRB): IRRIGATION AND DEBRIDEMENT RIGHT HIP (Right) Evacuation of hematoma    Patient reports pain as mild.  Sitting comfortably in bed with breakfast at bedside  Objective:   VITALS:   Filed Vitals:   11/17/13 0633  BP: 127/72  Pulse: 80  Temp: 97.7 F (36.5 C)  Resp: 20    Neurovascular intact Incision: dressing C/D/I  LABS  Recent Labs  11/15/13 1720 11/16/13 0405  HGB 5.7* 6.7*  HCT 19.1* 21.6*  WBC 7.5 6.9  PLT 451* 391     Recent Labs  11/15/13 1720 11/16/13 0405 11/17/13 0415  NA 132* 134* 134*  K 5.0 4.2 4.5  BUN 21 19 19   CREATININE 1.10 0.99 1.02  GLUCOSE 137* 113* 98    No results found for this basename: LABPT, INR,  in the last 72 hours   Assessment/Plan: 1 Day Post-Op Procedure(s) (LRB): IRRIGATION AND DEBRIDEMENT RIGHT HIP (Right)   Advance diet Up with therapy Keep drain, drain out tomorrow Follow up labs  D/C to SNF Monday Will change dressing Monday

## 2013-11-17 NOTE — Progress Notes (Signed)
Discussed via phone with T.Shuford, PA, re: pt's foley. Order given to d/c. Cythia Bachtel, [employer]

## 2013-11-17 NOTE — Progress Notes (Signed)
Notified Tracey Shufford, PA of patients hgb of 7.5 no new orders received at this time. Will continue to monitor and notify MD of changes.

## 2013-11-18 LAB — BASIC METABOLIC PANEL
BUN: 19 mg/dL (ref 6–23)
CALCIUM: 7.8 mg/dL — AB (ref 8.4–10.5)
CO2: 27 mEq/L (ref 19–32)
Chloride: 101 mEq/L (ref 96–112)
Creatinine, Ser: 0.86 mg/dL (ref 0.50–1.35)
GFR calc Af Amer: >90 mL/min (ref >90)
GFR calc non Af Amer: 87 mL/min — ABNORMAL LOW (ref >90)
GLUCOSE: 118 mg/dL — AB (ref 70–99)
Potassium: 4.2 mEq/L (ref 3.7–5.3)
SODIUM: 137 meq/L (ref 137–147)

## 2013-11-18 LAB — CBC WITH DIFFERENTIAL/PLATELET
Basophils Absolute: 0 10*3/uL (ref 0.0–0.1)
Basophils Relative: 0 % (ref 0–1)
Eosinophils Absolute: 0.3 10*3/uL (ref 0.0–0.7)
Eosinophils Relative: 6 % — ABNORMAL HIGH (ref 0–5)
HCT: 24.3 % — ABNORMAL LOW (ref 39.0–52.0)
Hemoglobin: 7.4 g/dL — ABNORMAL LOW (ref 13.0–17.0)
Lymphocytes Relative: 18 % (ref 12–46)
Lymphs Abs: 0.8 10*3/uL (ref 0.7–4.0)
MCH: 25.3 pg — ABNORMAL LOW (ref 26.0–34.0)
MCHC: 30.5 g/dL (ref 30.0–36.0)
MCV: 83.2 fL (ref 78.0–100.0)
Monocytes Absolute: 0.4 10*3/uL (ref 0.1–1.0)
Monocytes Relative: 9 % (ref 3–12)
Neutro Abs: 3.2 10*3/uL (ref 1.7–7.7)
Neutrophils Relative %: 68 % (ref 43–77)
Platelets: 393 10*3/uL (ref 150–400)
RBC: 2.92 MIL/uL — ABNORMAL LOW (ref 4.22–5.81)
RDW: 17 % — ABNORMAL HIGH (ref 11.5–15.5)
WBC: 4.8 10*3/uL (ref 4.0–10.5)

## 2013-11-18 LAB — PREPARE RBC (CROSSMATCH)

## 2013-11-18 LAB — HEMOGLOBIN AND HEMATOCRIT, BLOOD
HCT: 23.5 % — ABNORMAL LOW (ref 39.0–52.0)
Hemoglobin: 7.2 g/dL — ABNORMAL LOW (ref 13.0–17.0)

## 2013-11-18 LAB — WOUND CULTURE: Culture: NO GROWTH

## 2013-11-18 NOTE — Evaluation (Addendum)
Physical Therapy Evaluation Patient Details Name: [name] MRN: [id number] DOB: 1945-12-01 Today's Date: 11/18/2013 Time: 1601-0932 PT Time Calculation (min): 25 min  PT Assessment / Plan / Recommendation History of Present Illness  s/p I&D R hip for large hematoma. pt is s/p R THRevision 10/26/13 plus L wrist ORIF casted.  L1 comp fx. Per Dr Alvan Dame PN today pt now TTWB R LE for transfers only.  Clinical Impression  *Pt admitted with R hip hematoma, s/p I&D**. Pt currently with functional limitations due to the deficits listed below (see PT Problem List).  Pt will benefit from skilled PT to increase their independence and safety with mobility to allow discharge to the venue listed below.   **    PT Assessment  Patient needs continued PT services    Follow Up Recommendations  SNF    Does the patient have the potential to tolerate intense rehabilitation      Barriers to Discharge        Equipment Recommendations  None recommended by PT    Recommendations for Other Services     Frequency Min 3X/week    Precautions / Restrictions Precautions Precautions: Back;Posterior Hip;Fall Precaution Comments: Pt recalls 3/3 THP Restrictions Weight Bearing Restrictions: Yes LUE Weight Bearing: Non weight bearing (allowed to WB thru forearm) RLE Weight Bearing: Touchdown weight bearing Other Position/Activity Restrictions: n/a   Pertinent Vitals/Pain *7/10 R hip Premedicated, ice applied**      Mobility  Bed Mobility Overal bed mobility: Needs Assistance Bed Mobility: Supine to Sit Supine to sit: Min assist General bed mobility comments: R LE assist Transfers Overall transfer level: Needs assistance Equipment used: Rolling walker (2 wheeled) Transfers: Sit to/from [facility] Sit to Stand: Min assist;+2 safety/equipment Stand pivot transfers: +2 safety/equipment;Min assist General transfer comment: min A to stabilize, manage drain, IV Ambulation/Gait Gait  velocity: Transfers only per Dr Alvan Dame.    Exercises     PT Diagnosis: Generalized weakness;Acute pain  PT Problem List: Decreased strength;Decreased range of motion;Decreased activity tolerance;Decreased balance;Decreased mobility;Decreased knowledge of use of DME;Decreased safety awareness PT Treatment Interventions: Therapeutic activities;Therapeutic exercise;Patient/family education     PT Goals(Current goals can be found in the care plan section) Acute Rehab PT Goals Patient Stated Goal: Rehab at [facility]. PT Goal Formulation: With patient Time For Goal Achievement: 11/05/13 Potential to Achieve Goals: Good  Visit Information  Last PT Received On: 11/18/13 History of Present Illness: s/p I&D R hip for large hematoma. pt is s/p R THRevision plus L wrist ORIF casted.  L1 comp fx. Per Dr Alvan Dame PN today pt now TTWB R LE for transfers only.       Prior [place] expects to be discharged to:: Skilled nursing facility Additional Comments: pt expects to go back to [place] Prior Function Level of Independence: Needs assistance Gait / Transfers Assistance Needed: stand pivot with RW only to [place], mobilized in [facility] Communication Communication: No difficulties    Cognition  Cognition Arousal/Alertness: Awake/alert Behavior During Therapy: WFL for tasks assessed/performed Overall Cognitive Status: Within Functional Limits for tasks assessed    Extremity/Trunk Assessment Upper Extremity Assessment Upper Extremity Assessment: LUE deficits/detail LUE Deficits / Details: L hand/wrist in forearm cast Lower Extremity Assessment RLE Deficits / Details: knee ext 4/5, ankle WNL, hip -3/5 Cervical / Trunk Assessment Cervical / Trunk Assessment: Kyphotic   Balance    End of Session PT - End of Session Equipment Utilized During Treatment: Gait belt Activity Tolerance: Patient  tolerated treatment well Patient left: in chair;with call bell/phone within  reach Nurse Communication: Mobility status  GP     [name] 11/18/2013, 8:53 AM [phone]

## 2013-11-18 NOTE — Progress Notes (Signed)
Subjective: 2 Days Post-Op Procedure(s) (LRB): IRRIGATION AND DEBRIDEMENT RIGHT HIP (Right) Patient reports pain as 2 on 0-10 scale. Hemovac is still draining,therefore will leave it in. Awaiting repeat Hbg.Last Hbg was 7.5.   Objective: Vital signs in last 24 hours: Temp:  [97.9 F (36.6 C)-98.6 F (37 C)] 98.6 F (37 C) (02/08 0500) Pulse Rate:  [77-90] 90 (02/08 0500) Resp:  [18-20] 20 (02/08 0500) BP: (129-142)/(60-82) 137/77 mmHg (02/08 0500) SpO2:  [97 %-100 %] 97 % (02/08 0500)  Intake/Output from previous day: 02/07 0701 - 02/08 0700 In: 2817.5 [P.O.:960; I.V.:1857.5] Out: 1975 [Urine:1835; Drains:140] Intake/Output this shift:     Recent Labs  11/15/13 1720 11/16/13 0405 11/17/13 0853  HGB 5.7* 6.7* 7.5*    Recent Labs  11/16/13 0405 11/17/13 0853  WBC 6.9 6.2  RBC 2.66* 2.96*  HCT 21.6* 24.5*  PLT 391 384    Recent Labs  11/17/13 0415 11/18/13 0445  NA 134* 137  K 4.5 4.2  CL 99 101  CO2 26 27  BUN 19 19  CREATININE 1.02 0.86  GLUCOSE 98 118*  CALCIUM 7.8* 7.8*   No results found for this basename: LABPT, INR,  in the last 72 hours  No cellulitis present  Assessment/Plan: 2 Days Post-Op Procedure(s) (LRB): IRRIGATION AND DEBRIDEMENT RIGHT HIP (Right) Up with therapy. Will follow Hbg.  Angee Gupton A 11/18/2013, 8:36 AM

## 2013-11-18 NOTE — Progress Notes (Signed)
H&H results given to Ria Bush, PA. No new orders. Will recheck in am. Manley Fason, [employer]

## 2013-11-18 NOTE — Progress Notes (Signed)
H&H & VS given to Ria Bush, PA, via phone. She stated will continue to monitor & will order new labs for 3 pm. Harjit Leider, [employer]

## 2013-11-19 ENCOUNTER — Encounter (HOSPITAL_COMMUNITY): Payer: Self-pay | Admitting: Orthopedic Surgery

## 2013-11-19 LAB — TYPE AND SCREEN
ABO/RH(D): A POS
Antibody Screen: NEGATIVE
Unit division: 0
Unit division: 0
Unit division: 0
Unit division: 0

## 2013-11-19 LAB — BASIC METABOLIC PANEL
BUN: 13 mg/dL (ref 6–23)
CALCIUM: 8 mg/dL — AB (ref 8.4–10.5)
CHLORIDE: 95 meq/L — AB (ref 96–112)
CO2: 27 mEq/L (ref 19–32)
Creatinine, Ser: 0.76 mg/dL (ref 0.50–1.35)
GFR calc non Af Amer: >90 mL/min (ref >90)
Glucose, Bld: 123 mg/dL — ABNORMAL HIGH (ref 70–99)
Potassium: 3.9 mEq/L (ref 3.7–5.3)
SODIUM: 132 meq/L — AB (ref 137–147)

## 2013-11-19 LAB — CBC
HCT: 28.4 % — ABNORMAL LOW (ref 39.0–52.0)
Hemoglobin: 8.9 g/dL — ABNORMAL LOW (ref 13.0–17.0)
MCH: 26 pg (ref 26.0–34.0)
MCHC: 31.3 g/dL (ref 30.0–36.0)
MCV: 83 fL (ref 78.0–100.0)
Platelets: 366 10*3/uL (ref 150–400)
RBC: 3.42 MIL/uL — ABNORMAL LOW (ref 4.22–5.81)
RDW: 16.2 % — AB (ref 11.5–15.5)
WBC: 4.9 10*3/uL (ref 4.0–10.5)

## 2013-11-19 MED ORDER — RIFAMPIN 300 MG PO CAPS: 300.0000 mg | ORAL_CAPSULE | Freq: Two times a day (BID) | ORAL | Status: DC

## 2013-11-19 MED ORDER — HEPARIN SOD (PORK) LOCK FLUSH 100 UNIT/ML IV SOLN
250.0000 [IU] | INTRAVENOUS | Status: DC | PRN
  Administered 2013-11-19: 13:00:00

## 2013-11-19 MED ORDER — METHOCARBAMOL 500 MG PO TABS: 500.0000 mg | ORAL_TABLET | Freq: Four times a day (QID) | ORAL | Status: DC | PRN

## 2013-11-19 MED ORDER — OXYCODONE HCL 5 MG PO TABS: 5.0000 mg | ORAL_TABLET | ORAL | Status: DC | PRN

## 2013-11-19 MED ORDER — VANCOMYCIN HCL 10 G IV SOLR: 1250.0000 mg | Freq: Two times a day (BID) | INTRAVENOUS | Status: DC

## 2013-11-19 NOTE — Progress Notes (Signed)
Pt returned to [facility] via [facility] transport this afternoon. Pt was in agreement with d/c plan.  Werner Lean LCSW [phone]

## 2013-11-19 NOTE — Progress Notes (Signed)
Patient ID: [name], male   DOB: [birth year], 68 y.o.   MRN: [id number] Subjective: 3 Days Post-Op Procedure(s) (LRB): IRRIGATION AND DEBRIDEMENT RIGHT HIP (Right)    Patient reports pain as mild lateral based right thigh pain per patient  Objective:   VITALS:   Filed Vitals:   11/19/13 0550  BP: 155/71  Pulse: 73  Temp: 98 F (36.7 C)  Resp: 16    Neurovascular intact Incision: no drainage HV in place but will remove today  LABS  Recent Labs  11/17/13 0853 11/18/13 0840 11/18/13 1500  HGB 7.5* 7.4* 7.2*  HCT 24.5* 24.3* 23.5*  WBC 6.2 4.8  --   PLT 384 393  --      Recent Labs  11/17/13 0415 11/18/13 0445  NA 134* 137  K 4.5 4.2  BUN 19 19  CREATININE 1.02 0.86  GLUCOSE 98 118*    No results found for this basename: LABPT, INR,  in the last 72 hours   Assessment/Plan: 3 Days Post-Op Procedure(s) (LRB): IRRIGATION AND DEBRIDEMENT RIGHT HIP (Right)   Up with therapy - TDWB RLE for transfers Discharge to SNF today versus tomorrow  Removing HV today and changing dressing to Aquacell prior to discharge

## 2013-11-19 NOTE — Care Management Note (Signed)
    Page 1 of 1   11/19/2013     5:36:06 PM   CARE MANAGEMENT NOTE 11/19/2013  Patient:  [name], [name]   Account Number:  [id number]  Date Initiated:  11/18/2013  Documentation initiated by:  [facility]  Subjective/Objective Assessment:   IRRIGATION AND DEBRIDEMENT RIGHT HIP (Right)     Action/Plan:   SNF for rehab   Anticipated DC Date:  11/19/2013   Anticipated DC Plan:  SKILLED NURSING FACILITY  In-house referral  Clinical Social Worker      DC Planning Services  CM consult      Choice offered to / List presented to:             Status of service:  Completed, signed off Medicare Important Message given?   (If response is "NO", the following Medicare IM given date fields will be blank) Date Medicare IM given:   Date Additional Medicare IM given:    Discharge Disposition:  SKILLED NURSING FACILITY  Per UR Regulation:    If discussed at Long Length of Stay Meetings, dates discussed:    Comments:

## 2013-11-19 NOTE — Discharge Summary (Signed)
Physician Discharge Summary  Patient ID: [name] MRN: [id number] DOB/AGE: [birth year] 69 y.o.  Admit date: 11/15/2013 Discharge date:  11/19/2013  Procedures:  Procedure(s) (LRB): IRRIGATION AND DEBRIDEMENT RIGHT HIP (Right)  Attending Physician:  Dr. Paralee Cancel   Admission Diagnoses:   Right hip pain / wound drainage  Discharge Diagnoses:  Principal Problem:   Wound drainage right hip  Past Medical History  Diagnosis Date  . Acid reflux   . Thyroid disease     HX GRAVES DISEASE  . Low testosterone, possible hypogonadism 10/01/2012  . Lumbago   . High cholesterol   . Anxiety   . Depression   . CAD (coronary artery disease)   . Arthritis   . Environmental allergies   . Vertebral compression fracture     L1-wears brace  . Sleep apnea     cpap machine-settings 17  . Transfusion history     s/p 12'13 hip surgery  . A-fib     hx.- sinus rhythm now  . Fracture of wrist 10/23/13    LEFT  . Osteoporosis   . Metabolic bone disease   . Plaque psoriasis     HPI: [name], 68 y.o. male, has a history of pain and functional disability in the right hip due to trauma. In August of 2013 he fell, broke his hip and a total hip replacement was performed to fix the hip. In December of 2013 he fell and caused a periprosthetic fracture on the right hip as well and wrist fracture. Upon return to the clinic it was noted that he had almost no boney ingrowth of the prosthesis. He was worked up for infection. It was decided to revise the hip components. While waiting to do hip surgery he rebroke his ankle through a plate and screws holding a previous fracture in place. He has also been diagnosed with compression fractures of the lumbar spine. Through all of the fractures he has sustained he saw an endocrinologist that diagnosed him with Metabolic Bone Disease. He had a revision of the right hip and was doing well, he then returned to the clinic and was brought to the OR to reduce  the right hip after dislocating the hip. After multiple attempts the hip remained unstable and would not stay reduced. On 10/27/2013 he underwent a revision of the total hip arthroplasty. Recently he has returned to the clinic and had drainage from the middle of the lateral incision. The drainage appears to be serosanguinous in nature, but has been persistent lately. Options were discussed and it was decided to bring him back to the OR for an I&D of the right hip wound. Risks, benefits and expectations were discussed with the patient. Risks including but not limited to the risk of anesthesia, blood clots, nerve damage, blood vessel damage, failure of the prosthesis, infection and up to and including death. Patient understand the risks, benefits and expectations and wishes to proceed with surgery.   PCP: Janie Morning, DO   Discharged Condition: fair  Hospital Course:  Patient was admitted to the hospital on 11/15/2013 to start IV antibiotics after being seen in the office for drainage from his wound.  Patient underwent the above stated procedure on 11/16/2013. Patient tolerated the procedure well and brought to the recovery room in good condition and subsequently to the floor.  POD #1 BP: 127/72 ; Pulse: 80 ; Temp: 97.7 F (36.5 C) ; Resp: 20  Patient reports pain as mild. Sitting comfortably in bed  with breakfast at bedside Neurovascular intact, dorsiflexion/plantar flexion intact, incision: dressing C/D/I, no cellulitis present and compartment soft.   LABS  Basename    HGB  7.5  HCT  24.5   POD #2  BP: 137/77 ; Pulse: 90 ; Temp: 98.6 F (37 C) ; Resp: 20  Patient reports pain as 2 on 0-10 scale. Hemovac is still draining,therefore will leave it in. Awaiting repeat Hbg.Last Hbg was 7.5. Neurovascular intact, dorsiflexion/plantar flexion intact, incision: dressing C/D/I, no cellulitis present and compartment soft.   LABS  Basename    HGB  7.2  HCT  23.5   POD #3  BP: 155/71 ; Pulse: 73 ;  Temp: 98 F (36.7 C) ; Resp: 16  Patient reports pain as mild lateral based right thigh pain per patient. Neurovascular intact, dorsiflexion/plantar flexion intact, incision: dressing C/D/I, no cellulitis present and compartment soft. HV in place but will remove today.  LABS   No new labs  Discharge Exam: General appearance: alert, cooperative and no distress Extremities: Homans sign is negative, no sign of DVT, no edema, redness or tenderness in the calves or thighs and no ulcers, gangrene or trophic changes  Disposition:    [place] with follow up in 2 weeks   Follow-up Information   Follow up with [name], MD. Schedule an appointment as soon as possible for a visit in 2 weeks.   Specialty:  Orthopedic Surgery   Contact information:   [address] [place] [address] [phone]       Discharge Orders   Future Orders Complete By Expires   Call MD / Call 911  As directed    Comments:     If you experience chest pain or shortness of breath, CALL 911 and be transported to the hospital emergency room.  If you develope a fever above 101 F, pus (white drainage) or increased drainage or redness at the wound, or calf pain, call your surgeon's office.   Constipation Prevention  As directed    Comments:     Drink plenty of fluids.  Prune juice may be helpful.  You may use a stool softener, such as Colace (over the counter) 100 mg twice a day.  Use MiraLax (over the counter) for constipation as needed.   Diet - low sodium heart healthy  As directed    Discharge instructions  As directed    Comments:     Maintain surgical dressing for 10-14 days, or until follow up in the clinic. Follow up in 2 weeks at [facility]. Call with any questions or concerns.   Touch down weight bearing  As directed    Scheduling Instructions:     TDWB for transfers only.   Questions:     Laterality:  right   Extremity:  Lower        Medication  List    STOP taking these medications       ciprofloxacin 500 MG tablet  Commonly known as:  CIPRO     sulfamethoxazole-trimethoprim 800-160 MG per tablet  Commonly known as:  BACTRIM DS      TAKE these medications       amiodarone 100 MG tablet  Commonly known as:  PACERONE  Take 100 mg by mouth daily.     atorvastatin 80 MG tablet  Commonly known as:  LIPITOR  Take 80 mg by mouth every evening.     BLINK TEARS 0.25 % Soln  Generic drug:  Polyethylene  Glycol 400  Apply 1 drop to eye 2 (two) times daily as needed (dry eyes).     clonazePAM 1 MG tablet  Commonly known as:  KLONOPIN  Take one tablet by mouth three times daily     diltiazem 240 MG 24 hr capsule  Commonly known as:  CARDIZEM CD  Take 240 mg by mouth every morning.     DSS 100 MG Caps  Take 100 mg by mouth 2 (two) times daily.     DULoxetine 60 MG capsule  Commonly known as:  CYMBALTA  Take 60 mg by mouth 2 (two) times daily.     ENBREL SURECLICK 50 MG/ML injection  Generic drug:  etanercept  Inject 50 mg into the skin once a week. On hold. Next dose due 11/30/13     ferrous sulfate 325 (65 FE) MG tablet  Take 325 mg by mouth 2 (two) times daily with a meal.     FORTEO [place]  Inject 20 mg into the skin daily.     levothyroxine 125 MCG tablet  Commonly known as:  SYNTHROID, LEVOTHROID  Take 250 mcg by mouth daily before breakfast.     loratadine 10 MG tablet  Commonly known as:  CLARITIN  Take 10 mg by mouth daily.     methocarbamol 500 MG tablet  Commonly known as:  ROBAXIN  Take 1 tablet (500 mg total) by mouth every 6 (six) hours as needed for muscle spasms.     multivitamin with minerals Tabs tablet  Take 1 tablet by mouth daily.     nitroGLYCERIN 0.4 MG SL tablet  Commonly known as:  NITROSTAT  Place 0.4 mg under the tongue every 5 (five) minutes as needed for chest pain. x3 doses as needed for chest pain     omeprazole 20 MG capsule  Commonly known as:  PRILOSEC  Take 20 mg by mouth  daily.     oxyCODONE 5 MG immediate release tablet  Commonly known as:  Oxy IR/ROXICODONE  Take 1-3 tablets (5-15 mg total) by mouth every 4 (four) hours as needed for severe pain (5mg  pain level 1-4, 10mg  pain level 5-7, 15 mg pain level 8-10).     oyster calcium 500 MG Tabs tablet  Take 500 mg of elemental calcium by mouth 2 (two) times daily.     polyethylene glycol packet  Commonly known as:  MIRALAX / GLYCOLAX  Take 17 g by mouth daily as needed. Constipation.     ranolazine 500 MG 12 hr tablet  Commonly known as:  RANEXA  Take 500 mg by mouth 2 (two) times daily.     rifampin 300 MG capsule  Commonly known as:  RIFADIN  Take 1 capsule (300 mg total) by mouth every 12 (twelve) hours.     saccharomyces boulardii 250 MG capsule  Commonly known as:  FLORASTOR  Take 250 mg by mouth daily.     sodium chloride 0.9 % SOLN 250 mL with vancomycin 10 G SOLR 1,250 mg  Inject 1,250 mg into the vein every 12 (twelve) hours.     tamsulosin 0.4 MG Caps capsule  Commonly known as:  FLOMAX  Take 0.4 mg by mouth every evening.     TESTOSTERONE TD  Place 200 mg onto the skin every 14 (fourteen) days.     tuberculin 5 UNIT/0.1ML injection  Inject 5 Units into the skin once.     vitamin C 1000 MG tablet  Take 1,000 mg by mouth daily.  Vitamin D3 5000 UNITS Caps  Take 5,000 Units by mouth daily.     XARELTO 20 MG Tabs tablet  Generic drug:  Rivaroxaban  Take 20 mg by mouth daily with supper.         Signed: [place]. Shavelle Lane   PAC  11/19/2013, 9:47 AM

## 2013-11-20 ENCOUNTER — Encounter: Payer: Self-pay | Admitting: Internal Medicine

## 2013-11-20 ENCOUNTER — Non-Acute Institutional Stay (SKILLED_NURSING_FACILITY): Payer: Medicare Other | Admitting: Internal Medicine

## 2013-11-20 ENCOUNTER — Other Ambulatory Visit: Payer: Self-pay | Admitting: *Deleted

## 2013-11-20 DIAGNOSIS — F329 Major depressive disorder, single episode, unspecified: Secondary | ICD-10-CM

## 2013-11-20 DIAGNOSIS — M8448XD Pathological fracture, other site, subsequent encounter for fracture with routine healing: Secondary | ICD-10-CM

## 2013-11-20 DIAGNOSIS — R7989 Other specified abnormal findings of blood chemistry: Secondary | ICD-10-CM

## 2013-11-20 DIAGNOSIS — F3289 Other specified depressive episodes: Secondary | ICD-10-CM

## 2013-11-20 DIAGNOSIS — M8080XG Other osteoporosis with current pathological fracture, unspecified site, subsequent encounter for fracture with delayed healing: Secondary | ICD-10-CM

## 2013-11-20 DIAGNOSIS — E291 Testicular hypofunction: Secondary | ICD-10-CM

## 2013-11-20 DIAGNOSIS — Z96649 Presence of unspecified artificial hip joint: Secondary | ICD-10-CM

## 2013-11-20 DIAGNOSIS — E039 Hypothyroidism, unspecified: Secondary | ICD-10-CM

## 2013-11-20 DIAGNOSIS — F32A Depression, unspecified: Secondary | ICD-10-CM

## 2013-11-20 DIAGNOSIS — F411 Generalized anxiety disorder: Secondary | ICD-10-CM

## 2013-11-20 MED ORDER — CLONAZEPAM 1 MG PO TABS: ORAL_TABLET | ORAL | Status: DC

## 2013-11-20 MED ORDER — TESTOSTERONE CYPIONATE 200 MG/ML IM SOLN: INTRAMUSCULAR | Status: DC

## 2013-11-20 NOTE — Telephone Encounter (Signed)
[facility] [place] 

## 2013-11-20 NOTE — Telephone Encounter (Signed)
[facility] [place] 

## 2013-11-20 NOTE — Progress Notes (Signed)
Patient ID: [name], male   DOB: Oct 12, 1945, 68 y.o.   MRN: [id number]  Provider:  Rexene Edison. Mariea Clonts, D.O., C.M.D. Location:  [facility]   PCP: Janie Morning, DO  Code Status: full code  No Known Allergies  Chief Complaint  Patient presents with  . Hospitalization Follow-up    new admission s/p hospitalization for debridement of right hip surgical site    HPI: 68 y.o. male with h/o possible hypogonadism with testosterone deficiency, Graves' disease, vertebral compression fx, osteoporosis, afib, psoriasis, hyperlipidemia, anxiety, depression and multiple falls with fractures was readmitted here for short term rehab s/p his latest hospitalization which was for debridement of his right hip surgical site.  He had undergone revision of his right hip arthroplasty on 10/26/13 by Dr. Alvan Dame.  He developed increased right hip pain and wound drainage and was rehospitalized on 11/15/13.  He underwent debridement of his right hip surgical site on 11/16/13.  He is to f/u again with Dr. Alvan Dame in 2 wks.  He remains on vanc via right picc since 2/6 and po rifampin (previously on doxycycline from 1/20-2/5).    ROS: Review of Systems  Constitutional: Negative for fever.  Eyes: Negative for blurred vision.  Respiratory: Negative for cough and shortness of breath.   Cardiovascular: Negative for chest pain and leg swelling.  Gastrointestinal: Negative for abdominal pain, constipation, blood in stool and melena.  Genitourinary: Negative for dysuria.  Musculoskeletal: Positive for falls and joint pain.  Skin: Negative for rash.  Neurological: Negative for dizziness, loss of consciousness and headaches.  Psychiatric/Behavioral: Positive for depression. The patient is nervous/anxious and has insomnia.      Past Medical History  Diagnosis Date  . Acid reflux   . Thyroid disease     HX GRAVES DISEASE  . Low testosterone, possible hypogonadism 10/01/2012  . Lumbago   . High cholesterol     . Anxiety   . Depression   . CAD (coronary artery disease)   . Arthritis   . Environmental allergies   . Vertebral compression fracture     L1-wears brace  . Sleep apnea     cpap machine-settings 17  . Transfusion history     s/p 12'13 hip surgery  . A-fib     hx.- sinus rhythm now  . Fracture of wrist 10/23/13    LEFT  . Osteoporosis   . Metabolic bone disease   . Plaque psoriasis    Past Surgical History  Procedure Laterality Date  . Knee surgery Bilateral 2012    Total knee replacements  . Appendectomy    . Right femur surgery  05/2012  . Orif wrist fracture  10/02/2012    Procedure: OPEN REDUCTION INTERNAL FIXATION (ORIF) WRIST FRACTURE;  Surgeon: Roseanne Kaufman, MD;  Location: [facility];  Service: Orthopedics;  Laterality: Right;  WITH   ANTIBIOTIC  CEMENT  . Total hip revision  10/05/2012    Procedure: TOTAL HIP REVISION;  Surgeon: [name], MD;  Location: [facility];  Service: Orthopedics;  Laterality: Right;  RIGHT TOTAL HIP REVISION  . Hardware removal  10/05/2012    Procedure: HARDWARE REMOVAL;  Surgeon: [name], MD;  Location: [facility];  Service: Orthopedics;  Laterality: Right;  REMOVING  STRYKER  GAMMA NAIL  . Wrist fracture surgery  05/2012  . Orif tibia fracture Right 02/06/2013    Procedure: OPEN REDUCTION INTERNAL FIXATION (ORIF) TIBIA FRACTURE WITH IM ROD FIBULA;  Surgeon: Rozanna Box, MD;  Location: [place];  Service: Orthopedics;  Laterality: Right;  . Cardiac catheterization  2013  . Orif tibia fracture Right 07/03/2013    Procedure: RIGHT TIBIA NON UNION REPAIR ;  Surgeon: Rozanna Box, MD;  Location: [place];  Service: Orthopedics;  Laterality: Right;  . Hardware removal Right 07/03/2013    Procedure: HARDWARE REMOVAL RIGHT TIBIA ;  Surgeon: Rozanna Box, MD;  Location: [place];  Service: Orthopedics;  Laterality: Right;  . Joint replacement      hip-right x2  . Tonsillectomy    . Total hip revision Right 09/17/2013    Procedure: REVISION RIGHT  TOTAL HIP ARTHROPLASTY ;  Surgeon: [name], MD;  Location: [facility];  Service: Orthopedics;  Laterality: Right;  . Hip closed reduction Right 10/15/2013    Procedure: CLOSED MANIPULATION HIP;  Surgeon: [name], MD;  Location: [facility];  Service: Orthopedics;  Laterality: Right;  . Total hip revision Right 10/26/2013    Procedure: REVISION RIGHT TOTAL HIP ARTHROPLASTY;  Surgeon: [name], MD;  Location: [facility];  Service: Orthopedics;  Laterality: Right;  . Orif wrist fracture Left 10/28/2013    Procedure: OPEN REDUCTION INTERNAL FIXATION (ORIF) WRIST FRACTURE with allograft;  Surgeon: Roseanne Kaufman, MD;  Location: [facility];  Service: Orthopedics;  Laterality: Left;  DVR Plate  . Incision and drainage hip Right 11/16/2013    Procedure: IRRIGATION AND DEBRIDEMENT RIGHT HIP;  Surgeon: [name], MD;  Location: [facility];  Service: Orthopedics;  Laterality: Right;   Social History:   reports that he quit smoking about 17 months ago. He has never used smokeless tobacco. He reports that he drinks alcohol. He reports that he does not use illicit drugs.  Family History  Problem Relation Age of Onset  . CAD Father 49  . Arthritis Mother     Medications: Patient's Medications  New Prescriptions   No medications on file  Previous Medications   AMIODARONE (PACERONE) 100 MG TABLET    Take 100 mg by mouth daily.   ASCORBIC ACID (VITAMIN C) 1000 MG TABLET    Take 1,000 mg by mouth daily.   ATORVASTATIN (LIPITOR) 80 MG TABLET    Take 80 mg by mouth every evening.    CHOLECALCIFEROL (VITAMIN D3) 5000 UNITS CAPS    Take 5,000 Units by mouth daily.   CLONAZEPAM (KLONOPIN) 1 MG TABLET    Take one tablet by mouth three times daily   DILTIAZEM (CARDIZEM CD) 240 MG 24 HR CAPSULE    Take 240 mg by mouth every morning.    DOCUSATE SODIUM 100 MG CAPS    Take 100 mg by mouth 2 (two) times daily.   DULOXETINE (CYMBALTA) 60 MG CAPSULE    Take 60 mg by mouth 2 (two) times daily.   ETANERCEPT (ENBREL  SURECLICK) 50 MG/ML INJECTION    Inject 50 mg into the skin once a week. On hold. Next dose due 11/30/13   FERROUS SULFATE 325 (65 FE) MG TABLET    Take 325 mg by mouth 2 (two) times daily with a meal.   LEVOTHYROXINE (SYNTHROID, LEVOTHROID) 125 MCG TABLET    Take 250 mcg by mouth daily before breakfast.   LORATADINE (CLARITIN) 10 MG TABLET    Take 10 mg by mouth daily.   METHOCARBAMOL (ROBAXIN) 500 MG TABLET    Take 1 tablet (500 mg total) by mouth every 6 (six) hours as needed for muscle spasms.   MULTIPLE VITAMIN (MULTIVITAMIN WITH  MINERALS) TABS TABLET    Take 1 tablet by mouth daily.   NITROGLYCERIN (NITROSTAT) 0.4 MG SL TABLET    Place 0.4 mg under the tongue every 5 (five) minutes as needed for chest pain. x3 doses as needed for chest pain   OMEPRAZOLE (PRILOSEC) 20 MG CAPSULE    Take 20 mg by mouth daily.   OXYCODONE (OXY IR/ROXICODONE) 5 MG IMMEDIATE RELEASE TABLET    Take 1-3 tablets (5-15 mg total) by mouth every 4 (four) hours as needed for severe pain (5mg  pain level 1-4, 10mg  pain level 5-7, 15 mg pain level 8-10).   OYSTER SHELL (OYSTER CALCIUM) 500 MG TABS TABLET    Take 500 mg of elemental calcium by mouth 2 (two) times daily.   POLYETHYLENE GLYCOL (MIRALAX / GLYCOLAX) PACKET    Take 17 g by mouth daily as needed. Constipation.   POLYETHYLENE GLYCOL 400 (BLINK TEARS) 0.25 % SOLN    Apply 1 drop to eye 2 (two) times daily as needed (dry eyes).   RANOLAZINE (RANEXA) 500 MG 12 HR TABLET    Take 500 mg by mouth 2 (two) times daily.   RIFAMPIN (RIFADIN) 300 MG CAPSULE    Take 1 capsule (300 mg total) by mouth every 12 (twelve) hours.   RIVAROXABAN (XARELTO) 20 MG TABS    Take 20 mg by mouth daily with supper.   SACCHAROMYCES BOULARDII (FLORASTOR) 250 MG CAPSULE    Take 250 mg by mouth daily.   SODIUM CHLORIDE 0.9 % SOLN 250 ML WITH VANCOMYCIN 10 G SOLR 1,250 MG    Inject 1,250 mg into the vein every 12 (twelve) hours.   TAMSULOSIN HCL (FLOMAX) 0.4 MG CAPS    Take 0.4 mg by mouth every  evening.    TERIPARATIDE, RECOMBINANT, (FORTEO [place])    Inject 20 mg into the skin daily.    TESTOSTERONE TD    Place 200 mg onto the skin every 14 (fourteen) days.   TUBERCULIN 5 UNIT/0.1ML INJECTION    Inject 5 Units into the skin once.  Modified Medications   No medications on file  Discontinued Medications   No medications on file     Physical Exam: Filed Vitals:   11/20/13 1235  BP: 143/73  Pulse: 73  Temp: 97 F (36.1 C)  Resp: 18  Height: 6\' 4"  (1.93 m)  Weight: 312 lb (141.522 kg)  SpO2: 98%  Physical Exam  Constitutional: He is oriented to person, place, and time. He appears well-developed and well-nourished. No distress.  Obese white male  HENT:  Head: Normocephalic and atraumatic.  Right Ear: External ear normal.  Left Ear: External ear normal.  Nose: Nose normal.  Mouth/Throat: Oropharynx is clear and moist. No oropharyngeal exudate.  Eyes: Conjunctivae and EOM are normal. Pupils are equal, round, and reactive to light.  Neck: Normal range of motion. Neck supple. No JVD present.  Cardiovascular: Intact distal pulses.   irreg irreg  Pulmonary/Chest: Effort normal and breath sounds normal.  Abdominal: Soft. Bowel sounds are normal. He exhibits no distension and no mass. There is no tenderness.  Musculoskeletal: Normal range of motion.  Right leg with mild edema present and tenderness over the hip  Neurological: He is alert and oriented to person, place, and time.  Skin: Skin is warm and dry.  Psychiatric: He has a normal mood and affect. His behavior is normal. Judgment and thought content normal.    Labs reviewed: Basic Metabolic Panel:  Recent Labs  11/17/13 0415 11/18/13  0445 11/19/13 0925  NA 134* 137 132*  K 4.5 4.2 3.9  CL 99 101 95*  CO2 26 27 27   GLUCOSE 98 118* 123*  BUN 19 19 13   CREATININE 1.02 0.86 0.76  CALCIUM 7.8* 7.8* 8.0*   Liver Function Tests:  Recent Labs  02/07/13 0610 02/08/13 0431 02/09/13 0530  AST 12 11 12   ALT 12  10 11   ALKPHOS 154* 147* 145*  BILITOT 0.4 0.4 0.4  PROT 5.6* 5.7* 5.8*  ALBUMIN 2.5* 2.5* 2.5*  CBC:  Recent Labs  07/03/13 0720  11/17/13 0853 11/18/13 0840 11/18/13 1500 11/19/13 0925  WBC 5.6  < > 6.2 4.8  --  4.9  NEUTROABS 3.0  --  5.0 3.2  --   --   HGB 12.0*  < > 7.5* 7.4* 7.2* 8.9*  HCT 38.5*  < > 24.5* 24.3* 23.5* 28.4*  MCV 76.4*  < > 82.8 83.2  --  83.0  PLT 268  < > 384 393  --  366  < > = values in this interval not displayed. Cardiac Enzymes:  Recent Labs  02/21/13 2146 02/22/13 0455 02/22/13 1009  TROPONINI <0.30 <0.30 <0.30  CBG:  Recent Labs  02/07/13 0643 02/07/13 1123 02/07/13 1614  GLUCAP 141* 103* 122*    Imaging and Procedures: No imaging was done.  Assessment/Plan 1. Anxiety state, unspecified -clonazepam had gotten further reduced and now he is anxious at night--requests bedtime pill back -is aware that it increases his fall risk  2. Osteoporosis with fracture, with delayed healing, subsequent encounter - cont vitamin D, testosterone for hypogonadism and reclast annually  3. S/P revision of total hip -f/u with Dr. Alvan Dame in 2 wks as planned -cont current pain regimen  4. Hypothyroidism, unspecified hypothyroidism type -cont synthroid at current dose  5. Depression -cont currrent antidepressant regimen  6. Low testosterone -cont repletion  Functional status:  Dependent for bathing, dressing, transfers due to his surgery and poor balance  Family/ staff Communication: seen with unit supervisor, also spoke with his husband  Labs/tests ordered:

## 2013-11-20 NOTE — Progress Notes (Signed)
Discharge summary sent to payer through [facility]  

## 2013-11-20 NOTE — Progress Notes (Signed)
Oxycodone 10mg  was returned to [facility] to inventory.

## 2013-11-21 LAB — ANAEROBIC CULTURE

## 2013-11-23 LAB — TYPE AND SCREEN
ABO/RH(D): A POS
ANTIBODY SCREEN: NEGATIVE
UNIT DIVISION: 0
Unit division: 0

## 2013-11-30 ENCOUNTER — Other Ambulatory Visit: Payer: Self-pay | Admitting: *Deleted

## 2013-11-30 MED ORDER — OXYCODONE HCL 5 MG PO TABS: ORAL_TABLET | ORAL | Status: DC

## 2013-11-30 NOTE — Telephone Encounter (Signed)
[facility] [place] 

## 2013-12-01 ENCOUNTER — Encounter: Payer: Self-pay | Admitting: Internal Medicine

## 2013-12-06 ENCOUNTER — Non-Acute Institutional Stay (SKILLED_NURSING_FACILITY): Payer: Medicare Other | Admitting: Internal Medicine

## 2013-12-06 DIAGNOSIS — Z96649 Presence of unspecified artificial hip joint: Secondary | ICD-10-CM

## 2013-12-06 DIAGNOSIS — F32A Depression, unspecified: Secondary | ICD-10-CM

## 2013-12-06 DIAGNOSIS — F419 Anxiety disorder, unspecified: Secondary | ICD-10-CM

## 2013-12-06 DIAGNOSIS — G4700 Insomnia, unspecified: Secondary | ICD-10-CM

## 2013-12-06 DIAGNOSIS — F3289 Other specified depressive episodes: Secondary | ICD-10-CM

## 2013-12-06 DIAGNOSIS — F329 Major depressive disorder, single episode, unspecified: Secondary | ICD-10-CM

## 2013-12-06 DIAGNOSIS — F411 Generalized anxiety disorder: Secondary | ICD-10-CM

## 2013-12-06 NOTE — Progress Notes (Signed)
Patient ID: [name], male   DOB: 07-14-1946, 68 y.o.   MRN: [id number]    [facility] Gemma living [employer]  Chief Complaint  Patient presents with  . Acute Visit    depression, trouble sleeping, anxiety   No Known Allergies  HPI 68 y/o male patient is here for STR.  He has history of Graves' disease, vertebral compression fx, osteoporosis, afib, psoriasis, hyperlipidemia, anxiety, depression and multiple falls with fractures. He was recently hospitalized for debridement of his right hip surgical site. he is working with rehab team. He mentions that he feels more depressed and anxious with his partner in [place]. He has trouble sleeping. He is seeing Dr Alvan Dame tomorrow for removal of stitches. Pain is under control  Review of Systems:  Constitutional: Negative for fever, chills, weight loss HENT: Negative for congestion, hearing loss and sore throat.   Eyes: Negative for blurred vision, double vision and discharge.  Respiratory: Negative for cough, sputum production, shortness of breath and wheezing.   Cardiovascular: Negative for chest pain, palpitations, orthopnea and leg swelling.  Gastrointestinal: Negative for heartburn, nausea, vomiting Musculoskeletal: Negative for back pain, falls. Skin: Negative for itching and rash.  Neurological: Positive for weakness. Negative for dizziness, tingling, focal weakness and headaches.  Psychiatric/Behavioral: Negative for memory loss. The patient is anxious  Past Medical History  Diagnosis Date  . Acid reflux   . Thyroid disease     HX GRAVES DISEASE  . Low testosterone, possible hypogonadism 10/01/2012  . Lumbago   . High cholesterol   . Anxiety   . Depression   . CAD (coronary artery disease)   . Arthritis   . Environmental allergies   . Vertebral compression fracture     L1-wears brace  . Sleep apnea     cpap machine-settings 17  . Transfusion history     s/p 12'13 hip surgery  . A-fib     hx.- sinus rhythm now  . Fracture  of wrist 10/23/13    LEFT  . Osteoporosis   . Metabolic bone disease   . Plaque psoriasis    Medication reviewed. See [facility]  Physical exam BP 135/84  Pulse 80  Temp(Src) 97.7 F (36.5 C)  Resp 18  General- elderly male in no acute distress Head- atraumatic, normocephalic Eyes- PERRLA, EOMI, no pallor, no icterus Neck- no lymphadenopathy Cardiovascular- normal s1,s2, no murmurs/ rubs/ gallops Respiratory- bilateral clear to auscultation, no wheeze, no rhonchi, no crackles Abdomen- bowel sounds present, soft, non tender Musculoskeletal- able to move all 4 extremities, left wrist cast in place, able to move his fingers, good circulation.  NWB in right leg Neurological- no focal deficit, Psychiatry- alert and oriented to person, place and time, anxious and tearful during conversation   Labs 12/03/13 - wbc 5.2, hb 11.6, hct 35.7, plt 310, na 135, k 4.5, glu 133, bun 17, cr 0.96  Assessment/plan  Anxiety- Worsened at present. Increase clonazepam to 2 mg in am and bedtime and 1 mg in afternoon. Continue 1 mg qhs prn when needed and monitor for sedation  Depression- continue duloxetine bid for now  Insomnia- clonazepam adjusted, should help with his sleep cycle as well  Right hip infection- continue rifampin and vancomycin. Has picc line. D/c florastor as increases risk of fungemia

## 2013-12-10 ENCOUNTER — Other Ambulatory Visit: Payer: Self-pay | Admitting: *Deleted

## 2013-12-10 MED ORDER — OXYCODONE HCL 5 MG PO TABS: ORAL_TABLET | ORAL | Status: DC

## 2013-12-10 NOTE — Telephone Encounter (Signed)
[facility] [place] 

## 2013-12-17 ENCOUNTER — Other Ambulatory Visit: Payer: Self-pay | Admitting: Internal Medicine

## 2013-12-17 ENCOUNTER — Ambulatory Visit (HOSPITAL_COMMUNITY)
Admission: RE | Admit: 2013-12-17 | Discharge: 2013-12-17 | Disposition: A | Discharge status: Home / Self Care | Payer: Medicare Other | Source: Ambulatory Visit | Attending: Internal Medicine | Admitting: Internal Medicine

## 2013-12-17 DIAGNOSIS — B999 Unspecified infectious disease: Secondary | ICD-10-CM

## 2013-12-17 DIAGNOSIS — E0500 Thyrotoxicosis with diffuse goiter without thyrotoxic crisis or storm: Secondary | ICD-10-CM | POA: Insufficient documentation

## 2013-12-26 ENCOUNTER — Other Ambulatory Visit: Payer: Self-pay | Admitting: *Deleted

## 2013-12-26 DIAGNOSIS — F411 Generalized anxiety disorder: Secondary | ICD-10-CM

## 2013-12-26 MED ORDER — CLONAZEPAM 1 MG PO TABS: ORAL_TABLET | ORAL | Status: DC

## 2013-12-26 NOTE — Telephone Encounter (Signed)
[facility] [place] 

## 2014-01-01 ENCOUNTER — Other Ambulatory Visit: Payer: Self-pay | Admitting: *Deleted

## 2014-01-01 MED ORDER — OXYCODONE HCL 5 MG PO TABS: ORAL_TABLET | ORAL | Status: DC

## 2014-01-01 NOTE — Telephone Encounter (Signed)
rx faxed to [facility] @ [phone]

## 2014-01-02 ENCOUNTER — Other Ambulatory Visit: Payer: Self-pay | Admitting: *Deleted

## 2014-01-02 DIAGNOSIS — F411 Generalized anxiety disorder: Secondary | ICD-10-CM

## 2014-01-02 MED ORDER — CLONAZEPAM 1 MG PO TABS: ORAL_TABLET | ORAL | Status: DC

## 2014-01-02 NOTE — Telephone Encounter (Signed)
[facility] [place] 

## 2014-01-04 ENCOUNTER — Other Ambulatory Visit: Payer: Self-pay

## 2014-01-07 ENCOUNTER — Other Ambulatory Visit: Payer: Self-pay | Admitting: *Deleted

## 2014-01-07 MED ORDER — OXYCODONE HCL 5 MG PO TABS: ORAL_TABLET | ORAL | Status: DC

## 2014-01-07 NOTE — Telephone Encounter (Signed)
[facility] [place] 

## 2014-02-05 ENCOUNTER — Other Ambulatory Visit: Payer: Self-pay | Admitting: *Deleted

## 2014-02-05 MED ORDER — OXYCODONE HCL 5 MG PO TABS: ORAL_TABLET | ORAL | Status: DC

## 2014-02-05 NOTE — Telephone Encounter (Signed)
rx faxed to [facility] @ [phone] 

## 2014-07-26 ENCOUNTER — Other Ambulatory Visit: Payer: Self-pay

## 2014-10-02 ENCOUNTER — Encounter: Payer: Self-pay | Admitting: *Deleted

## 2015-04-07 ENCOUNTER — Other Ambulatory Visit: Payer: Self-pay

## 2015-04-17 ENCOUNTER — Other Ambulatory Visit (HOSPITAL_COMMUNITY): Payer: Self-pay | Admitting: Orthopedic Surgery

## 2015-04-17 DIAGNOSIS — Z96641 Presence of right artificial hip joint: Principal | ICD-10-CM

## 2015-04-17 DIAGNOSIS — Z471 Aftercare following joint replacement surgery: Secondary | ICD-10-CM

## 2015-05-01 ENCOUNTER — Encounter (HOSPITAL_COMMUNITY)
Admission: RE | Admit: 2015-05-01 | Discharge: 2015-05-01 | Disposition: A | Discharge status: Home / Self Care | Payer: Medicare Other | Source: Ambulatory Visit | Attending: Orthopedic Surgery | Admitting: Orthopedic Surgery

## 2015-05-01 DIAGNOSIS — Z471 Aftercare following joint replacement surgery: Secondary | ICD-10-CM | POA: Diagnosis not present

## 2015-05-01 DIAGNOSIS — Z96641 Presence of right artificial hip joint: Secondary | ICD-10-CM | POA: Diagnosis present

## 2015-05-01 MED ORDER — TECHNETIUM TC 99M MEDRONATE IV KIT
27.3000 | PACK | Freq: Once | INTRAVENOUS | Status: AC | PRN
  Administered 2015-05-01: 27.3 via INTRAVENOUS

## 2015-07-16 ENCOUNTER — Other Ambulatory Visit: Payer: Self-pay | Admitting: Nurse Practitioner

## 2015-07-16 DIAGNOSIS — M810 Age-related osteoporosis without current pathological fracture: Secondary | ICD-10-CM

## 2015-08-18 ENCOUNTER — Ambulatory Visit
Admission: RE | Admit: 2015-08-18 | Discharge: 2015-08-18 | Disposition: A | Discharge status: Home / Self Care | Payer: Medicare Other | Source: Ambulatory Visit | Attending: Nurse Practitioner | Admitting: Nurse Practitioner

## 2015-08-18 DIAGNOSIS — M810 Age-related osteoporosis without current pathological fracture: Secondary | ICD-10-CM

## 2015-11-19 DIAGNOSIS — F411 Generalized anxiety disorder: Secondary | ICD-10-CM | POA: Insufficient documentation

## 2015-12-02 DIAGNOSIS — F418 Other specified anxiety disorders: Secondary | ICD-10-CM | POA: Insufficient documentation

## 2016-01-12 DIAGNOSIS — E785 Hyperlipidemia, unspecified: Secondary | ICD-10-CM | POA: Insufficient documentation

## 2016-02-19 ENCOUNTER — Emergency Department (HOSPITAL_BASED_OUTPATIENT_CLINIC_OR_DEPARTMENT_OTHER): Payer: Medicare Other

## 2016-02-19 ENCOUNTER — Emergency Department (HOSPITAL_BASED_OUTPATIENT_CLINIC_OR_DEPARTMENT_OTHER)
Admission: EM | Admit: 2016-02-19 | Discharge: 2016-02-19 | Disposition: A | Discharge status: Home / Self Care | Payer: Medicare Other | Attending: Emergency Medicine | Admitting: Emergency Medicine

## 2016-02-19 ENCOUNTER — Encounter (HOSPITAL_BASED_OUTPATIENT_CLINIC_OR_DEPARTMENT_OTHER): Payer: Self-pay | Admitting: *Deleted

## 2016-02-19 DIAGNOSIS — S0541XA Penetrating wound of orbit with or without foreign body, right eye, initial encounter: Secondary | ICD-10-CM | POA: Diagnosis not present

## 2016-02-19 DIAGNOSIS — Z87891 Personal history of nicotine dependence: Secondary | ICD-10-CM | POA: Diagnosis not present

## 2016-02-19 DIAGNOSIS — S0093XA Contusion of unspecified part of head, initial encounter: Secondary | ICD-10-CM

## 2016-02-19 DIAGNOSIS — S80211A Abrasion, right knee, initial encounter: Secondary | ICD-10-CM | POA: Diagnosis not present

## 2016-02-19 DIAGNOSIS — Y92009 Unspecified place in unspecified non-institutional (private) residence as the place of occurrence of the external cause: Secondary | ICD-10-CM | POA: Insufficient documentation

## 2016-02-19 DIAGNOSIS — M1990 Unspecified osteoarthritis, unspecified site: Secondary | ICD-10-CM | POA: Diagnosis not present

## 2016-02-19 DIAGNOSIS — Y9301 Activity, walking, marching and hiking: Secondary | ICD-10-CM | POA: Diagnosis not present

## 2016-02-19 DIAGNOSIS — F329 Major depressive disorder, single episode, unspecified: Secondary | ICD-10-CM | POA: Diagnosis not present

## 2016-02-19 DIAGNOSIS — W109XXA Fall (on) (from) unspecified stairs and steps, initial encounter: Secondary | ICD-10-CM | POA: Diagnosis not present

## 2016-02-19 DIAGNOSIS — S20311A Abrasion of right front wall of thorax, initial encounter: Secondary | ICD-10-CM | POA: Insufficient documentation

## 2016-02-19 DIAGNOSIS — S90511A Abrasion, right ankle, initial encounter: Secondary | ICD-10-CM | POA: Diagnosis not present

## 2016-02-19 DIAGNOSIS — S0990XA Unspecified injury of head, initial encounter: Secondary | ICD-10-CM | POA: Diagnosis present

## 2016-02-19 DIAGNOSIS — I2510 Atherosclerotic heart disease of native coronary artery without angina pectoris: Secondary | ICD-10-CM | POA: Diagnosis not present

## 2016-02-19 DIAGNOSIS — Y999 Unspecified external cause status: Secondary | ICD-10-CM | POA: Diagnosis not present

## 2016-02-19 DIAGNOSIS — T07XXXA Unspecified multiple injuries, initial encounter: Secondary | ICD-10-CM

## 2016-02-19 DIAGNOSIS — S0181XA Laceration without foreign body of other part of head, initial encounter: Secondary | ICD-10-CM

## 2016-02-19 DIAGNOSIS — W19XXXA Unspecified fall, initial encounter: Secondary | ICD-10-CM

## 2016-02-19 DIAGNOSIS — I4891 Unspecified atrial fibrillation: Secondary | ICD-10-CM | POA: Insufficient documentation

## 2016-02-19 MED ORDER — HYDROCODONE-ACETAMINOPHEN 5-325 MG PO TABS
1.5000 | ORAL_TABLET | Freq: Once | ORAL | Status: AC
  Administered 2016-02-19: 1.5 via ORAL
  Filled 2016-02-19: qty 2

## 2016-02-19 MED ORDER — LIDOCAINE-EPINEPHRINE (PF) 2 %-1:200000 IJ SOLN
10.0000 mL | Freq: Once | INTRAMUSCULAR | Status: AC
  Administered 2016-02-19: 10 mL
  Filled 2016-02-19: qty 20

## 2016-02-19 MED ORDER — HYDROCODONE-ACETAMINOPHEN 7.5-325 MG PO TABS
1.0000 | ORAL_TABLET | Freq: Once | ORAL | Status: DC
  Filled 2016-02-19: qty 1

## 2016-02-19 NOTE — ED Notes (Addendum)
Pt c/o fall from standing, with multiple abrasions , pt on blood thinner

## 2016-02-19 NOTE — Discharge Instructions (Signed)
Facial Laceration ° A facial laceration is a cut on the face. These injuries can be painful and cause bleeding. Lacerations usually heal quickly, but they need special care to reduce scarring. °DIAGNOSIS  °Your health care provider will take a medical history, ask for details about how the injury occurred, and examine the wound to determine how deep the cut is. °TREATMENT  °Some facial lacerations may not require closure. Others may not be able to be closed because of an increased risk of infection. The risk of infection and the chance for successful closure will depend on various factors, including the amount of time since the injury occurred. °The wound may be cleaned to help prevent infection. If closure is appropriate, pain medicines may be given if needed. Your health care provider will use stitches (sutures), wound glue (adhesive), or skin adhesive strips to repair the laceration. These tools bring the skin edges together to allow for faster healing and a better cosmetic outcome. If needed, you may also be given a tetanus shot. °HOME CARE INSTRUCTIONS °· Only take over-the-counter or prescription medicines as directed by your health care provider. °· Follow your health care provider's instructions for wound care. These instructions will vary depending on the technique used for closing the wound. °For Sutures: °· Keep the wound clean and dry.   °· If you were given a bandage (dressing), you should change it at least once a day. Also change the dressing if it becomes wet or dirty, or as directed by your health care provider.   °· Wash the wound with soap and water 2 times a day. Rinse the wound off with water to remove all soap. Pat the wound dry with a clean towel.   °· After cleaning, apply a thin layer of the antibiotic ointment recommended by your health care provider. This will help prevent infection and keep the dressing from sticking.   °· You may shower as usual after the first 24 hours. Do not soak the  wound in water until the sutures are removed.   °· Get your sutures removed as directed by your health care provider. With facial lacerations, sutures should usually be taken out after 4-5 days to avoid stitch marks.   °· Wait a few days after your sutures are removed before applying any makeup. °For Skin Adhesive Strips: °· Keep the wound clean and dry.   °· Do not get the skin adhesive strips wet. You may bathe carefully, using caution to keep the wound dry.   °· If the wound gets wet, pat it dry with a clean towel.   °· Skin adhesive strips will fall off on their own. You may trim the strips as the wound heals. Do not remove skin adhesive strips that are still stuck to the wound. They will fall off in time.   °For Wound Adhesive: °· You may briefly wet your wound in the shower or bath. Do not soak or scrub the wound. Do not swim. Avoid periods of heavy sweating until the skin adhesive has fallen off on its own. After showering or bathing, gently pat the wound dry with a clean towel.   °· Do not apply liquid medicine, cream medicine, ointment medicine, or makeup to your wound while the skin adhesive is in place. This may loosen the film before your wound is healed.   °· If a dressing is placed over the wound, be careful not to apply tape directly over the skin adhesive. This may cause the adhesive to be pulled off before the wound is healed.   °· Avoid   prolonged exposure to sunlight or tanning lamps while the skin adhesive is in place.  The skin adhesive will usually remain in place for 5-10 days, then naturally fall off the skin. Do not pick at the adhesive film.  After Healing: Once the wound has healed, cover the wound with sunscreen during the day for 1 full year. This can help minimize scarring. Exposure to ultraviolet light in the first year will darken the scar. It can take 1-2 years for the scar to lose its redness and to heal completely.  SEEK MEDICAL CARE IF:  You have a fever. SEEK IMMEDIATE  MEDICAL CARE IF:  You have redness, pain, or swelling around the wound.   You see ayellowish-white fluid (pus) coming from the wound.    This information is not intended to replace advice given to you by your health care provider. Make sure you discuss any questions you have with your health care provider.   Document Released: 11/04/2004 Document Revised: 10/18/2014 Document Reviewed: 05/10/2013 Elsevier Interactive Patient Education 2016 [employer].  Facial or Scalp Contusion A facial or scalp contusion is a deep bruise on the face or head. Injuries to the face and head generally cause a lot of swelling, especially around the eyes. Contusions are the result of an injury that caused bleeding under the skin. The contusion may turn blue, purple, or yellow. Minor injuries will give you a painless contusion, but more severe contusions may stay painful and swollen for a few weeks.  CAUSES  A facial or scalp contusion is caused by a blunt injury or trauma to the face or head area.  SIGNS AND SYMPTOMS   Swelling of the injured area.   Discoloration of the injured area.   Tenderness, soreness, or pain in the injured area.  DIAGNOSIS  The diagnosis can be made by taking a medical history and doing a physical exam. An X-ray exam, CT scan, or MRI may be needed to determine if there are any associated injuries, such as broken bones (fractures). TREATMENT  Often, the best treatment for a facial or scalp contusion is applying cold compresses to the injured area. Over-the-counter medicines may also be recommended for pain control.  HOME CARE INSTRUCTIONS   Only take over-the-counter or prescription medicines as directed by your health care provider.   Apply ice to the injured area.   Put ice in a plastic bag.   Place a towel between your skin and the bag.   Leave the ice on for 20 minutes, 2-3 times a day.  SEEK MEDICAL CARE IF:  You have bite problems.   You have pain with  chewing.   You are concerned about facial defects. SEEK IMMEDIATE MEDICAL CARE IF:  You have severe pain or a headache that is not relieved by medicine.   You have unusual sleepiness, confusion, or personality changes.   You throw up (vomit).   You have a persistent nosebleed.   You have double vision or blurred vision.   You have fluid drainage from your nose or ear.   You have difficulty walking or using your arms or legs.  MAKE SURE YOU:   Understand these instructions.  Will watch your condition.  Will get help right away if you are not doing well or get worse.   This information is not intended to replace advice given to you by your health care provider. Make sure you discuss any questions you have with your health care provider.   Document Released: 11/04/2004  Document Revised: 10/18/2014 Document Reviewed: 05/10/2013 Elsevier Interactive Patient Education [employer].

## 2016-02-19 NOTE — ED Notes (Signed)
Pt left hip gave way while coming down stairs, fell down 1 step no loc, has lac about 1 inch over rt eye, abrasions to rt facial area, rt knee and rt eye,  Bleeding controlled

## 2016-02-19 NOTE — ED Provider Notes (Signed)
CSN: [id number]     Arrival date & time 02/19/16  1908 History  By signing my name below, I, Rowan Blase, attest that this documentation has been prepared under the direction and in the presence of Quintella Reichert, MD . Electronically Signed: Rowan Blase, Scribe. 02/19/2016. 7:31 PM.   Chief Complaint  Patient presents with  . Fall   The history is provided by the patient. No language interpreter was used.   HPI Comments:  [name] is a 70 y.o. male with PMHx of right hip replacement, BL knee replacement and osteoporosis who presents to the Emergency Department s/p fall complaining of laceration to right temple, and abrasions to right knee and right inner ankle. Pt was walking down steps, missed a step because his right leg gave out, and fell, hitting his head. Pt reports associated pain in laceration above right eyebrow and right rib pain. No alleviating factors noted or treatments attempted PTA. Pt is scheduled for a revision of his right hip in 5 days. Pt currently takes Xarelto. He has ambulated normally since the fall and usually uses a walker to ambulate. Pt's Tetanus is up to date. Denies syncope, difficulty breathing or abdominal pain.  Past Medical History  Diagnosis Date  . Acid reflux   . Thyroid disease     HX GRAVES DISEASE  . Low testosterone, possible hypogonadism 10/01/2012  . Lumbago   . High cholesterol   . Anxiety   . Depression   . CAD (coronary artery disease)   . Arthritis   . Environmental allergies   . Vertebral compression fracture (HCC)     L1-wears brace  . Sleep apnea     cpap machine-settings 17  . Transfusion history     s/p 12'13 hip surgery  . A-fib (HCC)     hx.- sinus rhythm now  . Fracture of wrist 10/23/13    LEFT  . Osteoporosis   . Metabolic bone disease   . Plaque psoriasis    Past Surgical History  Procedure Laterality Date  . Knee surgery Bilateral 2012    Total knee replacements  . Appendectomy    . Right femur  surgery  05/2012  . Orif wrist fracture  10/02/2012    Procedure: OPEN REDUCTION INTERNAL FIXATION (ORIF) WRIST FRACTURE;  Surgeon: Roseanne Kaufman, MD;  Location: [facility];  Service: Orthopedics;  Laterality: Right;  WITH   ANTIBIOTIC  CEMENT  . Total hip revision  10/05/2012    Procedure: TOTAL HIP REVISION;  Surgeon: [name], MD;  Location: [facility];  Service: Orthopedics;  Laterality: Right;  RIGHT TOTAL HIP REVISION  . Hardware removal  10/05/2012    Procedure: HARDWARE REMOVAL;  Surgeon: [name], MD;  Location: [facility];  Service: Orthopedics;  Laterality: Right;  REMOVING  STRYKER  GAMMA NAIL  . Wrist fracture surgery  05/2012  . Orif tibia fracture Right 02/06/2013    Procedure: OPEN REDUCTION INTERNAL FIXATION (ORIF) TIBIA FRACTURE WITH IM ROD FIBULA;  Surgeon: Rozanna Box, MD;  Location: [place];  Service: Orthopedics;  Laterality: Right;  . Cardiac catheterization  2013  . Orif tibia fracture Right 07/03/2013    Procedure: RIGHT TIBIA NON UNION REPAIR ;  Surgeon: Rozanna Box, MD;  Location: [place];  Service: Orthopedics;  Laterality: Right;  . Hardware removal Right 07/03/2013    Procedure: HARDWARE REMOVAL RIGHT TIBIA ;  Surgeon: Rozanna Box, MD;  Location: [place];  Service: Orthopedics;  Laterality: Right;  .  Joint replacement      hip-right x2  . Tonsillectomy    . Total hip revision Right 09/17/2013    Procedure: REVISION RIGHT TOTAL HIP ARTHROPLASTY ;  Surgeon: [name], MD;  Location: [facility];  Service: Orthopedics;  Laterality: Right;  . Hip closed reduction Right 10/15/2013    Procedure: CLOSED MANIPULATION HIP;  Surgeon: [name], MD;  Location: [facility];  Service: Orthopedics;  Laterality: Right;  . Total hip revision Right 10/26/2013    Procedure: REVISION RIGHT TOTAL HIP ARTHROPLASTY;  Surgeon: [name], MD;  Location: [facility];  Service: Orthopedics;  Laterality: Right;  . Orif wrist fracture Left 10/28/2013    Procedure: OPEN REDUCTION INTERNAL  FIXATION (ORIF) WRIST FRACTURE with allograft;  Surgeon: Roseanne Kaufman, MD;  Location: [facility];  Service: Orthopedics;  Laterality: Left;  DVR Plate  . Incision and drainage hip Right 11/16/2013    Procedure: IRRIGATION AND DEBRIDEMENT RIGHT HIP;  Surgeon: [name], MD;  Location: [facility];  Service: Orthopedics;  Laterality: Right;   Family History  Problem Relation Age of Onset  . CAD Father 24  . Arthritis Mother    Social History  Substance Use Topics  . Smoking status: Former Smoker    Quit date: 06/05/2012  . Smokeless tobacco: Never Used  . Alcohol Use: Yes     Comment: occasional-social    Review of Systems  Respiratory: Negative for shortness of breath.   Gastrointestinal: Negative for abdominal pain.  Musculoskeletal: Positive for arthralgias.  Skin: Positive for wound.  Neurological: Negative for syncope.  All other systems reviewed and are negative.  Allergies  Review of patient's allergies indicates no known allergies.  Home Medications   Prior to Admission medications   Medication Sig Start Date End Date Taking? Authorizing Provider  amiodarone (PACERONE) 100 MG tablet Take 100 mg by mouth daily.    Historical Provider, MD  Ascorbic Acid (VITAMIN C) 1000 MG tablet Take 1,000 mg by mouth daily.    Historical Provider, MD  atorvastatin (LIPITOR) 80 MG tablet Take 80 mg by mouth every evening.     Historical Provider, MD  Cholecalciferol (VITAMIN D3) 5000 UNITS CAPS Take 5,000 Units by mouth daily.    Historical Provider, MD  clonazePAM (KLONOPIN) 1 MG tablet Take two tablets by mouth twice daily; Take one tablet by mouth at noon for anxiety 01/02/14   Lauree Chandler, NP  diltiazem (CARDIZEM CD) 240 MG 24 hr capsule Take 240 mg by mouth every morning.  05/19/13   Historical Provider, MD  docusate sodium 100 MG CAPS Take 100 mg by mouth 2 (two) times daily. 10/30/13   Danae Orleans, PA-C  DULoxetine (CYMBALTA) 60 MG capsule Take 60 mg by mouth 2 (two) times daily.     Historical Provider, MD  etanercept (ENBREL SURECLICK) 50 MG/ML injection Inject 50 mg into the skin once a week. On hold. Next dose due 11/30/13    Historical Provider, MD  ferrous sulfate 325 (65 FE) MG tablet Take 325 mg by mouth 2 (two) times daily with a meal.    Historical Provider, MD  levothyroxine (SYNTHROID, LEVOTHROID) 125 MCG tablet Take 250 mcg by mouth daily before breakfast.    Historical Provider, MD  loratadine (CLARITIN) 10 MG tablet Take 10 mg by mouth daily.    Historical Provider, MD  methocarbamol (ROBAXIN) 500 MG tablet Take 1 tablet (500 mg total) by mouth every 6 (six) hours as needed for muscle spasms.  11/19/13   Danae Orleans, PA-C  Multiple Vitamin (MULTIVITAMIN WITH MINERALS) TABS tablet Take 1 tablet by mouth daily.    Historical Provider, MD  nitroGLYCERIN (NITROSTAT) 0.4 MG SL tablet Place 0.4 mg under the tongue every 5 (five) minutes as needed for chest pain. x3 doses as needed for chest pain    Historical Provider, MD  omeprazole (PRILOSEC) 20 MG capsule Take 20 mg by mouth daily.    Historical Provider, MD  oxyCODONE (OXY IR/ROXICODONE) 5 MG immediate release tablet Take 1-2 (One to Two) tablets by mouth every 4 hours as needed for pain 02/05/14   Blanchie Serve, MD  Oyster Shell (OYSTER CALCIUM) 500 MG TABS tablet Take 500 mg of elemental calcium by mouth 2 (two) times daily.    Historical Provider, MD  polyethylene glycol (MIRALAX / GLYCOLAX) packet Take 17 g by mouth daily as needed. Constipation.    Historical Provider, MD  Polyethylene Glycol 400 (BLINK TEARS) 0.25 % SOLN Apply 1 drop to eye 2 (two) times daily as needed (dry eyes).    Historical Provider, MD  ranolazine (RANEXA) 500 MG 12 hr tablet Take 500 mg by mouth 2 (two) times daily.    Historical Provider, MD  rifampin (RIFADIN) 300 MG capsule Take 1 capsule (300 mg total) by mouth every 12 (twelve) hours. 11/19/13   Danae Orleans, PA-C  Rivaroxaban (XARELTO) 20 MG TABS Take 20 mg by mouth daily with  supper.    Historical Provider, MD  saccharomyces boulardii (FLORASTOR) 250 MG capsule Take 250 mg by mouth daily.    Historical Provider, MD  sodium chloride 0.9 % SOLN 250 mL with vancomycin 10 G SOLR 1,250 mg Inject 1,250 mg into the vein every 12 (twelve) hours. 11/19/13   Danae Orleans, PA-C  Tamsulosin HCl (FLOMAX) 0.4 MG CAPS Take 0.4 mg by mouth every evening.     Historical Provider, MD  Teriparatide, Recombinant, (FORTEO [place]han) Inject 20 mg into the skin daily.     Historical Provider, MD  testosterone cypionate (DEPOTESTOTERONE CYPIONATE) 200 MG/ML injection Inject 200mg  (47ml) intramuscularly every 14 days 11/20/13   Estill Dooms, MD  TESTOSTERONE TD Place 200 mg onto the skin every 14 (fourteen) days.    Historical Provider, MD  tuberculin 5 UNIT/0.1ML injection Inject 5 Units into the skin once.    Historical Provider, MD   BP 127/77 mmHg  Pulse 80  Temp(Src) 98.9 F (37.2 C) (Oral)  Resp 16  SpO2 97% Physical Exam  Constitutional: He is oriented to person, place, and time. He appears well-developed and well-nourished.  HENT:  Head: Normocephalic.  2cm laceration to right periorbital region with local swelling and small adjoining abrasion.  Small abrasion over right nose.   Eyes: Pupils are equal, round, and reactive to light.  Neck: Neck supple.  Cardiovascular: Normal rate and regular rhythm.   No murmur heard. Pulmonary/Chest: Effort normal and breath sounds normal. No respiratory distress.  Small abrasion over right anterior chest without tenderness  Abdominal: Soft. There is no tenderness. There is no rebound and no guarding.  Musculoskeletal: He exhibits no tenderness.  Nonpitting edema of BLE.  Abrasion over right anterior knee, right medial ankle without local tenderness.    Neurological: He is alert and oriented to person, place, and time.  Skin: Skin is warm and dry.  Psychiatric: He has a normal mood and affect. His behavior is normal.  Nursing note and vitals  reviewed.   ED Course  Procedures   LACERATION REPAIR  Performed by: Quintella Reichert Authorized by: Quintella Reichert Consent: Verbal consent obtained. Risks and benefits: risks, benefits and alternatives were discussed Consent given by: patient Patient identity confirmed: provided demographic data Prepped and Draped in normal sterile fashion Wound explored  Laceration Location: right temple  Laceration Length: 2 cm  No Foreign Bodies seen or palpated  Anesthesia: local infiltration  Local anesthetic: lidocaine 1% with epinephrine  Anesthetic total: 1 ml  Irrigation method: syringe Amount of cleaning: standard  Skin closure: 5-0 prolene  Number of sutures: 3  Technique: interrupted  Patient tolerance: Patient tolerated the procedure well with no immediate complications.  DIAGNOSTIC STUDIES:  Oxygen Saturation is 97% on RA, normal by my interpretation.    COORDINATION OF CARE:  7:27 PM Will order head CT, chest x-ray and prep to suture laceration. Discussed treatment plan with pt at bedside and pt agreed to plan.  Labs Review Labs Reviewed - No data to display  Imaging Review Dg Chest 2 View  02/19/2016  CLINICAL DATA:  Patient status post fall. No reported loss of consciousness. EXAM: CHEST  2 VIEW COMPARISON:  Chest radiograph 07/03/2013. FINDINGS: Stable cardiac and mediastinal contours. No consolidative pulmonary opacities. No pleural effusion or pneumothorax. Osseous demineralization. No acute osseous abnormalities. IMPRESSION: No active cardiopulmonary disease. Electronically Signed   By: Lovey Newcomer M.D.   On: 02/19/2016 21:23   Ct Head Wo Contrast  02/19/2016  CLINICAL DATA:  Tripped on stairs, falling down 1 step at home. EXAM: CT HEAD WITHOUT CONTRAST TECHNIQUE: Contiguous axial images were obtained from the base of the skull through the vertex without intravenous contrast. COMPARISON:  02/20/2013 FINDINGS: There is orbital emphysema in the superior aspect  of the right orbit, indicative of a fracture communicating with either the right frontal sinus or the right ethmoid air cells. There is no pneumo cranium. Optic globes are intact. Optic nerves and extraocular muscles appear grossly intact. There is periorbital soft tissue swelling on the right. The fracture is not discretely visible on this study. Based on the distribution of the orbital emphysema, my suspicion is that the fracture communicates with the right ethmoid air cells, a few of which are opacified. There is no air in the inferior aspect of the orbit suggests communication with the maxillary sinus through the orbital floor. There is no intracranial hemorrhage, mass or evidence of acute infarction. There is mild generalized atrophy. There is mild chronic microvascular ischemic change. There is no significant extra-axial fluid collection. No acute intracranial findings are evident. The calvarium and skullbase are intact. Visible paranasal sinuses and orbits are unremarkable. IMPRESSION: 1. Orbital emphysema in the superior aspect of the right orbit, indicative of an occult fracture through either the inferior aspect of the frontal sinus or the ethmoid air cells. There is no pneumocranium. Consider maxillofacial CT to identify the fracture. 2. No acute intracranial traumatic injury. There is mild generalized atrophy and chronic appearing white matter hypodensities which likely represent small vessel ischemic disease. 3. These results will be called to the ordering clinician or representative by the Radiologist Assistant, and communication documented in the PACS or zVision Dashboard. Electronically Signed   By: Andreas Newport M.D.   On: 02/19/2016 21:24   I have personally reviewed and evaluated these images and lab results as part of my medical decision-making.   EKG Interpretation None      MDM   Final diagnoses:  Fall, initial encounter  Abrasions of multiple sites  Facial laceration, initial  encounter  Head  contusion, initial encounter  Fall   Patient here for evaluation of injuries following a mechanical fall, takes a relative. He has a right temporal/periorbital laceration that was repaired per procedure note. No evidence of foreign body on examination. Discussed with patient home care for abrasions and laceration following a fall. Discussed outpatient follow-up and return precautions.    I personally performed the services described in this documentation, which was scribed in my presence. The recorded information has been reviewed and is accurate.   Quintella Reichert, MD 02/19/16 [phone]

## 2016-02-26 ENCOUNTER — Encounter (HOSPITAL_BASED_OUTPATIENT_CLINIC_OR_DEPARTMENT_OTHER): Payer: Self-pay | Admitting: *Deleted

## 2016-02-26 ENCOUNTER — Emergency Department (HOSPITAL_BASED_OUTPATIENT_CLINIC_OR_DEPARTMENT_OTHER)
Admission: EM | Admit: 2016-02-26 | Discharge: 2016-02-26 | Disposition: A | Discharge status: Home / Self Care | Payer: Medicare Other | Attending: Emergency Medicine | Admitting: Emergency Medicine

## 2016-02-26 DIAGNOSIS — Z4802 Encounter for removal of sutures: Secondary | ICD-10-CM | POA: Diagnosis present

## 2016-02-26 DIAGNOSIS — I4891 Unspecified atrial fibrillation: Secondary | ICD-10-CM | POA: Insufficient documentation

## 2016-02-26 DIAGNOSIS — I2510 Atherosclerotic heart disease of native coronary artery without angina pectoris: Secondary | ICD-10-CM | POA: Insufficient documentation

## 2016-02-26 DIAGNOSIS — F329 Major depressive disorder, single episode, unspecified: Secondary | ICD-10-CM | POA: Diagnosis not present

## 2016-02-26 DIAGNOSIS — Z87891 Personal history of nicotine dependence: Secondary | ICD-10-CM | POA: Diagnosis not present

## 2016-02-26 DIAGNOSIS — M810 Age-related osteoporosis without current pathological fracture: Secondary | ICD-10-CM | POA: Insufficient documentation

## 2016-02-26 DIAGNOSIS — M1990 Unspecified osteoarthritis, unspecified site: Secondary | ICD-10-CM | POA: Insufficient documentation

## 2016-02-26 NOTE — ED Notes (Signed)
MD at bedside. 

## 2016-02-26 NOTE — Discharge Instructions (Signed)
Suture Removal, Care After  Refer to this sheet in the next few weeks. These instructions provide you with information on caring for yourself after your procedure. Your health care provider may also give you more specific instructions. Your treatment has been planned according to current medical practices, but problems sometimes occur. Call your health care provider if you have any problems or questions after your procedure.  WHAT TO EXPECT AFTER THE PROCEDURE  After your stitches (sutures) are removed, it is typical to have the following:  · Some discomfort and swelling in the wound area.  · Slight redness in the area.  HOME CARE INSTRUCTIONS   · If you have skin adhesive strips over the wound area, do not take the strips off. They will fall off on their own in a few days. If the strips remain in place after 14 days, you may remove them.  · Change any bandages (dressings) at least once a day or as directed by your health care provider. If the bandage sticks, soak it off with warm, soapy water.  · Apply cream or ointment only as directed by your health care provider. If using cream or ointment, wash the area with soap and water 2 times a day to remove all the cream or ointment. Rinse off the soap and pat the area dry with a clean towel.  · Keep the wound area dry and clean. If the bandage becomes wet or dirty, or if it develops a bad smell, change it as soon as possible.  · Continue to protect the wound from injury.  · Use sunscreen when out in the sun. New scars become sunburned easily.  SEEK MEDICAL CARE IF:  · You have increasing redness, swelling, or pain in the wound.  · You see pus coming from the wound.  · You have a fever.  · You notice a bad smell coming from the wound or dressing.  · Your wound breaks open (edges not staying together).     This information is not intended to replace advice given to you by your health care provider. Make sure you discuss any questions you have with your health care  provider.     Document Released: 06/22/2001 Document Revised: 07/18/2013 Document Reviewed: 05/09/2013  Elsevier Interactive Patient Education ©2016 [employer]

## 2016-02-26 NOTE — ED Notes (Signed)
Pt made aware to return if symptoms worsen or if any life threatening symptoms occur.   

## 2016-02-26 NOTE — ED Provider Notes (Signed)
CSN: [id number]     Arrival date & time 02/26/16  1528 History   First MD Initiated Contact with Patient 02/26/16 1536     Chief Complaint  Patient presents with  . Suture / Staple Removal     (Consider location/radiation/quality/duration/timing/severity/associated sxs/prior Treatment) Patient is a 70 y.o. male presenting with suture removal. The history is provided by the patient.  Suture / Staple Removal This is a new problem. Episode onset: 1 week ago. The problem occurs constantly. The problem has not changed since onset.Pertinent negatives include no chest pain, no abdominal pain and no headaches. Nothing aggravates the symptoms. Nothing relieves the symptoms. He has tried nothing for the symptoms.    Past Medical History  Diagnosis Date  . Acid reflux   . Thyroid disease     HX GRAVES DISEASE  . Low testosterone, possible hypogonadism 10/01/2012  . Lumbago   . High cholesterol   . Anxiety   . Depression   . CAD (coronary artery disease)   . Arthritis   . Environmental allergies   . Vertebral compression fracture (HCC)     L1-wears brace  . Sleep apnea     cpap machine-settings 17  . Transfusion history     s/p 12'13 hip surgery  . A-fib (HCC)     hx.- sinus rhythm now  . Fracture of wrist 10/23/13    LEFT  . Osteoporosis   . Metabolic bone disease   . Plaque psoriasis    Past Surgical History  Procedure Laterality Date  . Knee surgery Bilateral 2012    Total knee replacements  . Appendectomy    . Right femur surgery  05/2012  . Orif wrist fracture  10/02/2012    Procedure: OPEN REDUCTION INTERNAL FIXATION (ORIF) WRIST FRACTURE;  Surgeon: Roseanne Kaufman, MD;  Location: [facility];  Service: Orthopedics;  Laterality: Right;  WITH   ANTIBIOTIC  CEMENT  . Total hip revision  10/05/2012    Procedure: TOTAL HIP REVISION;  Surgeon: [name], MD;  Location: [facility];  Service: Orthopedics;  Laterality: Right;  RIGHT TOTAL HIP REVISION  . Hardware removal  10/05/2012    Procedure: HARDWARE REMOVAL;  Surgeon: [name], MD;  Location: [facility];  Service: Orthopedics;  Laterality: Right;  REMOVING  STRYKER  GAMMA NAIL  . Wrist fracture surgery  05/2012  . Orif tibia fracture Right 02/06/2013    Procedure: OPEN REDUCTION INTERNAL FIXATION (ORIF) TIBIA FRACTURE WITH IM ROD FIBULA;  Surgeon: Rozanna Box, MD;  Location: [place];  Service: Orthopedics;  Laterality: Right;  . Cardiac catheterization  2013  . Orif tibia fracture Right 07/03/2013    Procedure: RIGHT TIBIA NON UNION REPAIR ;  Surgeon: Rozanna Box, MD;  Location: [place];  Service: Orthopedics;  Laterality: Right;  . Hardware removal Right 07/03/2013    Procedure: HARDWARE REMOVAL RIGHT TIBIA ;  Surgeon: Rozanna Box, MD;  Location: [place];  Service: Orthopedics;  Laterality: Right;  . Joint replacement      hip-right x2  . Tonsillectomy    . Total hip revision Right 09/17/2013    Procedure: REVISION RIGHT TOTAL HIP ARTHROPLASTY ;  Surgeon: [name], MD;  Location: [facility];  Service: Orthopedics;  Laterality: Right;  . Hip closed reduction Right 10/15/2013    Procedure: CLOSED MANIPULATION HIP;  Surgeon: [name], MD;  Location: [facility];  Service: Orthopedics;  Laterality: Right;  . Total hip revision Right 10/26/2013  Procedure: REVISION RIGHT TOTAL HIP ARTHROPLASTY;  Surgeon: [name], MD;  Location: [facility];  Service: Orthopedics;  Laterality: Right;  . Orif wrist fracture Left 10/28/2013    Procedure: OPEN REDUCTION INTERNAL FIXATION (ORIF) WRIST FRACTURE with allograft;  Surgeon: Roseanne Kaufman, MD;  Location: [facility];  Service: Orthopedics;  Laterality: Left;  DVR Plate  . Incision and drainage hip Right 11/16/2013    Procedure: IRRIGATION AND DEBRIDEMENT RIGHT HIP;  Surgeon: [name], MD;  Location: [facility];  Service: Orthopedics;  Laterality: Right;   Family History  Problem Relation Age of Onset  . CAD Father 28  . Arthritis Mother    Social History  Substance Use  Topics  . Smoking status: Former Smoker    Quit date: 06/05/2012  . Smokeless tobacco: Never Used  . Alcohol Use: Yes     Comment: occasional-social    Review of Systems  Cardiovascular: Negative for chest pain.  Gastrointestinal: Negative for abdominal pain.  Neurological: Negative for headaches.  All other systems reviewed and are negative.     Allergies  Review of patient's allergies indicates no known allergies.  Home Medications   Prior to Admission medications   Medication Sig Start Date End Date Taking? Authorizing Provider  amiodarone (PACERONE) 100 MG tablet Take 100 mg by mouth daily.    Historical Provider, MD  Ascorbic Acid (VITAMIN C) 1000 MG tablet Take 1,000 mg by mouth daily.    Historical Provider, MD  atorvastatin (LIPITOR) 80 MG tablet Take 80 mg by mouth every evening.     Historical Provider, MD  Cholecalciferol (VITAMIN D3) 5000 UNITS CAPS Take 5,000 Units by mouth daily.    Historical Provider, MD  clonazePAM (KLONOPIN) 1 MG tablet Take two tablets by mouth twice daily; Take one tablet by mouth at noon for anxiety 01/02/14   Lauree Chandler, NP  diltiazem (CARDIZEM CD) 240 MG 24 hr capsule Take 240 mg by mouth every morning.  05/19/13   Historical Provider, MD  docusate sodium 100 MG CAPS Take 100 mg by mouth 2 (two) times daily. 10/30/13   Danae Orleans, PA-C  DULoxetine (CYMBALTA) 60 MG capsule Take 60 mg by mouth 2 (two) times daily.    Historical Provider, MD  etanercept (ENBREL SURECLICK) 50 MG/ML injection Inject 50 mg into the skin once a week. On hold. Next dose due 11/30/13    Historical Provider, MD  ferrous sulfate 325 (65 FE) MG tablet Take 325 mg by mouth 2 (two) times daily with a meal.    Historical Provider, MD  levothyroxine (SYNTHROID, LEVOTHROID) 125 MCG tablet Take 250 mcg by mouth daily before breakfast.    Historical Provider, MD  loratadine (CLARITIN) 10 MG tablet Take 10 mg by mouth daily.    Historical Provider, MD  methocarbamol  (ROBAXIN) 500 MG tablet Take 1 tablet (500 mg total) by mouth every 6 (six) hours as needed for muscle spasms. 11/19/13   Danae Orleans, PA-C  Multiple Vitamin (MULTIVITAMIN WITH MINERALS) TABS tablet Take 1 tablet by mouth daily.    Historical Provider, MD  nitroGLYCERIN (NITROSTAT) 0.4 MG SL tablet Place 0.4 mg under the tongue every 5 (five) minutes as needed for chest pain. x3 doses as needed for chest pain    Historical Provider, MD  omeprazole (PRILOSEC) 20 MG capsule Take 20 mg by mouth daily.    Historical Provider, MD  oxyCODONE (OXY IR/ROXICODONE) 5 MG immediate release tablet Take 1-2 (One to Two) tablets by mouth  every 4 hours as needed for pain 02/05/14   Blanchie Serve, MD  Oyster Shell (OYSTER CALCIUM) 500 MG TABS tablet Take 500 mg of elemental calcium by mouth 2 (two) times daily.    Historical Provider, MD  polyethylene glycol (MIRALAX / GLYCOLAX) packet Take 17 g by mouth daily as needed. Constipation.    Historical Provider, MD  Polyethylene Glycol 400 (BLINK TEARS) 0.25 % SOLN Apply 1 drop to eye 2 (two) times daily as needed (dry eyes).    Historical Provider, MD  ranolazine (RANEXA) 500 MG 12 hr tablet Take 500 mg by mouth 2 (two) times daily.    Historical Provider, MD  rifampin (RIFADIN) 300 MG capsule Take 1 capsule (300 mg total) by mouth every 12 (twelve) hours. 11/19/13   Danae Orleans, PA-C  Rivaroxaban (XARELTO) 20 MG TABS Take 20 mg by mouth daily with supper.    Historical Provider, MD  saccharomyces boulardii (FLORASTOR) 250 MG capsule Take 250 mg by mouth daily.    Historical Provider, MD  sodium chloride 0.9 % SOLN 250 mL with vancomycin 10 G SOLR 1,250 mg Inject 1,250 mg into the vein every 12 (twelve) hours. 11/19/13   Danae Orleans, PA-C  Tamsulosin HCl (FLOMAX) 0.4 MG CAPS Take 0.4 mg by mouth every evening.     Historical Provider, MD  Teriparatide, Recombinant, (FORTEO [place]) Inject 20 mg into the skin daily.     Historical Provider, MD  testosterone cypionate  (DEPOTESTOTERONE CYPIONATE) 200 MG/ML injection Inject 200mg  (12ml) intramuscularly every 14 days 11/20/13   Estill Dooms, MD  TESTOSTERONE TD Place 200 mg onto the skin every 14 (fourteen) days.    Historical Provider, MD  tuberculin 5 UNIT/0.1ML injection Inject 5 Units into the skin once.    Historical Provider, MD   BP 172/90 mmHg  Pulse 100  Temp(Src) 98.2 F (36.8 C) (Oral)  Resp 18  Ht 6\' 3"  (1.905 m)  Wt 295 lb (133.811 kg)  BMI 36.87 kg/m2  SpO2 100% Physical Exam  Constitutional: He is oriented to person, place, and time. He appears well-developed and well-nourished. No distress.  HENT:  Head: Normocephalic and atraumatic.  Small well-healed laceration over the right orbit with sutures in place 3  Eyes: Conjunctivae are normal.  Neck: Neck supple. No tracheal deviation present.  Cardiovascular: Normal rate and regular rhythm.   Pulmonary/Chest: Effort normal. No respiratory distress.  Abdominal: Soft. He exhibits no distension.  Neurological: He is alert and oriented to person, place, and time.  Skin: Skin is warm and dry.  Psychiatric: He has a normal mood and affect.    ED Course  Procedures (including critical care time) Labs Review Labs Reviewed - No data to display  Imaging Review No results found. I have personally reviewed and evaluated these images and lab results as part of my medical decision-making.   EKG Interpretation None      MDM   Final diagnoses:  Visit for suture removal   SUTURE REMOVAL Performed by: Leo Grosser  Consent: Verbal consent obtained. Patient identity confirmed: provided demographic data Time out: Immediately prior to procedure a "time out" was called to verify the correct patient, procedure, equipment, support staff and site/side marked as required.  Location details: right eyebrow  Wound Appearance: clean  Sutures/Staples Removed: 3  Facility: sutures placed in this facility Patient tolerance: Patient tolerated  the procedure well with no immediate complications.   Patient presents for suture removal, removed without complications.    Leo Grosser,  MD 02/26/16 1635

## 2016-02-26 NOTE — ED Notes (Signed)
Here for suture removal from his right eyebrow. Bruising under his eye.

## 2016-03-11 HISTORY — PX: TOTAL HIP REVISION: SHX763

## 2016-03-17 LAB — BASIC METABOLIC PANEL
BUN: 23 mg/dL — AB (ref 4–21)
CREATININE: 1.2 mg/dL (ref 0.6–1.3)
Glucose: 102 mg/dL
Potassium: 4.6 mmol/L (ref 3.4–5.3)
Sodium: 139 mmol/L (ref 137–147)

## 2016-03-17 LAB — CBC AND DIFFERENTIAL
HEMATOCRIT: 39 % — AB (ref 41–53)
HEMOGLOBIN: 12.9 g/dL — AB (ref 13.5–17.5)
PLATELETS: 136 10*3/uL — AB (ref 150–399)
WBC: 7.7 10^3/mL

## 2016-03-22 ENCOUNTER — Encounter: Payer: Self-pay | Admitting: Adult Health

## 2016-03-22 ENCOUNTER — Non-Acute Institutional Stay (SKILLED_NURSING_FACILITY): Payer: Medicare Other | Admitting: Adult Health

## 2016-03-22 DIAGNOSIS — I480 Paroxysmal atrial fibrillation: Secondary | ICD-10-CM | POA: Diagnosis not present

## 2016-03-22 DIAGNOSIS — F329 Major depressive disorder, single episode, unspecified: Secondary | ICD-10-CM | POA: Diagnosis not present

## 2016-03-22 DIAGNOSIS — K5901 Slow transit constipation: Secondary | ICD-10-CM | POA: Diagnosis not present

## 2016-03-22 DIAGNOSIS — I2510 Atherosclerotic heart disease of native coronary artery without angina pectoris: Secondary | ICD-10-CM | POA: Diagnosis not present

## 2016-03-22 DIAGNOSIS — E038 Other specified hypothyroidism: Secondary | ICD-10-CM | POA: Diagnosis not present

## 2016-03-22 DIAGNOSIS — I10 Essential (primary) hypertension: Secondary | ICD-10-CM

## 2016-03-22 DIAGNOSIS — E291 Testicular hypofunction: Secondary | ICD-10-CM

## 2016-03-22 DIAGNOSIS — F419 Anxiety disorder, unspecified: Secondary | ICD-10-CM

## 2016-03-22 DIAGNOSIS — E785 Hyperlipidemia, unspecified: Secondary | ICD-10-CM

## 2016-03-22 DIAGNOSIS — T84038S Mechanical loosening of other internal prosthetic joint, sequela: Secondary | ICD-10-CM

## 2016-03-22 DIAGNOSIS — K219 Gastro-esophageal reflux disease without esophagitis: Secondary | ICD-10-CM

## 2016-03-22 DIAGNOSIS — Z96649 Presence of unspecified artificial hip joint: Principal | ICD-10-CM

## 2016-03-22 DIAGNOSIS — D62 Acute posthemorrhagic anemia: Secondary | ICD-10-CM

## 2016-03-22 DIAGNOSIS — N400 Enlarged prostate without lower urinary tract symptoms: Secondary | ICD-10-CM | POA: Diagnosis not present

## 2016-03-22 DIAGNOSIS — J309 Allergic rhinitis, unspecified: Secondary | ICD-10-CM

## 2016-03-22 DIAGNOSIS — R7989 Other specified abnormal findings of blood chemistry: Secondary | ICD-10-CM

## 2016-03-22 DIAGNOSIS — F32A Depression, unspecified: Secondary | ICD-10-CM

## 2016-03-22 NOTE — Progress Notes (Signed)
Patient ID: [name], male   DOB: 21-Aug-1946, 70 y.o.   MRN: [id number]    DATE:  03/22/2016   MRN:  [id number]  BIRTHDAY: [birth year]  Facility:  Nursing Home Location:  [place] Room Number: [id number]  LEVEL OF CARE:  SNF (31)  Contact Information    Name Relation Home Work Mobile   Ruggles,Michael Significant other [phone]  [phone]       Code Status History    Date Active Date Inactive Code Status Order ID Comments User Context   11/15/2013  5:00 PM 11/19/2013  6:24 PM Full Code [id number]  Lucille Passy Babish, PA-C Inpatient   10/26/2013  5:09 PM 10/30/2013  5:39 PM Full Code [id number]  Lucille Passy Heath, PA-C Inpatient   09/17/2013  6:56 PM 09/20/2013  4:22 PM Full Code [id number]  Lucille Passy Babish, PA-C Inpatient   02/21/2013  3:22 AM 02/24/2013  5:07 PM Full Code [id number]  [name], MD Inpatient   02/03/2013 12:49 AM 02/09/2013  6:25 PM Full Code [id number]  [name] Schooling, MD ED   09/29/2012  3:11 AM 10/10/2012  6:14 PM Full Code [id number]  Abbott Pao, RN Inpatient       Chief Complaint  Patient presents with  . Hospitalization Follow-up    HISTORY OF PRESENT ILLNESS:  This is a 70 year old male who has been admitted to [facility] on 03/19/16 from [facility]. He has PMH of Grave's disease, acquired hypothyroidism, CAD, psoriasis and paroxysmal atrial fibrillation on Xarelto. He had Loosening of right prosthetic hip for which he had right hip revision.  He complains of constipation.   He has been admitted for a short-term rehabilitation.   PAST MEDICAL HISTORY:  Past Medical History  Diagnosis Date  . Acid reflux   . Thyroid disease     HX GRAVES DISEASE  . Low testosterone, possible hypogonadism 10/01/2012  . Lumbago   . High cholesterol   . Anxiety   . Depression   . CAD (coronary artery disease)   . Arthritis   . Environmental allergies   . Vertebral compression fracture  (HCC)     L1-wears brace  . Sleep apnea     cpap machine-settings 17  . Transfusion history     s/p 12'13 hip surgery  . A-fib (HCC)     hx.- sinus rhythm now  . Fracture of wrist 10/23/13    LEFT  . Osteoporosis   . Metabolic bone disease   . Plaque psoriasis      CURRENT MEDICATIONS: Reviewed  Patient's Medications  New Prescriptions   No medications on file  Previous Medications   AMIODARONE (PACERONE) 100 MG TABLET    Take 100 mg by mouth daily.   ASCORBIC ACID (VITAMIN C) 1000 MG TABLET    Take 1,000 mg by mouth daily.   ATORVASTATIN (LIPITOR) 80 MG TABLET    Take 80 mg by mouth every evening.    BUPROPION (WELLBUTRIN XL) 300 MG 24 HR TABLET    Take 300 mg by mouth daily. Take in the AM   CALCIUM CARBONATE (OS-CAL - DOSED IN MG OF ELEMENTAL CALCIUM) 1250 (500 CA) MG TABLET    Take 1 tablet by mouth 2 (two) times daily with a meal.   CHOLECALCIFEROL (VITAMIN D3) 5000 UNITS CAPS    Take 5,000 Units by mouth daily.   CLONAZEPAM (KLONOPIN) 1 MG TABLET    Take 1 mg by  mouth every 6 (six) hours as needed for anxiety.   DILTIAZEM (CARDIZEM CD) 240 MG 24 HR CAPSULE    Take 240 mg by mouth every morning.    DOXYCYCLINE (VIBRA-TABS) 100 MG TABLET    Take 100 mg by mouth daily.   DULOXETINE (CYMBALTA) 60 MG CAPSULE    Take 60 mg by mouth 4 (four) times daily.    ENOXAPARIN (LOVENOX) 40 MG/0.4ML INJECTION    Inject 40 mg into the skin daily.   FERROUS SULFATE 325 (65 FE) MG TABLET    Take 325 mg by mouth daily with breakfast.    LEVOTHYROXINE (SYNTHROID, LEVOTHROID) 200 MCG TABLET    Take 200 mcg by mouth daily before breakfast. Take with a 75 mcg tablet to = 275 mcg   LEVOTHYROXINE (SYNTHROID, LEVOTHROID) 75 MCG TABLET    Take 75 mcg by mouth daily before breakfast. Take with 200 mcg tablet to = 275 mcg qd   LORATADINE (CLARITIN) 10 MG TABLET    Take 10 mg by mouth at bedtime.    MULTIPLE VITAMIN (MULTIVITAMIN WITH MINERALS) TABS TABLET    Take 1 tablet by mouth daily.   NITROGLYCERIN  (NITROSTAT) 0.4 MG SL TABLET    Place 0.4 mg under the tongue every 5 (five) minutes as needed for chest pain. x3 doses as needed for chest pain   OMEPRAZOLE (PRILOSEC) 40 MG CAPSULE    Take 40 mg by mouth daily.   OXYCODONE HCL 10 MG TABS    Take 10-15 mg by mouth every 4 (four) hours as needed. Take 10 mg for mild to moderate pain; take 1.5 tablets (15 mg) for severe pain   POLYETHYLENE GLYCOL (MIRALAX / GLYCOLAX) PACKET    Take 17 g by mouth daily as needed. Constipation.   RANOLAZINE (RANEXA) 500 MG 12 HR TABLET    Take 500 mg by mouth 2 (two) times daily.   RIVAROXABAN (XARELTO) 20 MG TABS    Take 20 mg by mouth daily with supper.   SACCHAROMYCES BOULARDII (FLORASTOR) 250 MG CAPSULE    Take 250 mg by mouth daily.   SENNOSIDES-DOCUSATE SODIUM (SENOKOT-S) 8.6-50 MG TABLET    Take 2 tablets by mouth 2 (two) times daily.   TAMSULOSIN HCL (FLOMAX) 0.4 MG CAPS    Take 0.4 mg by mouth every evening.    TESTOSTERONE CYPIONATE (DEPOTESTOTERONE CYPIONATE) 200 MG/ML INJECTION    Inject 200mg  (33ml) intramuscularly every 14 days   TIZANIDINE (ZANAFLEX) 6 MG CAPSULE    Take 6 mg by mouth every 6 (six) hours as needed for muscle spasms.  Modified Medications   No medications on file  Discontinued Medications   CLONAZEPAM (KLONOPIN) 1 MG TABLET    Take two tablets by mouth twice daily; Take one tablet by mouth at noon for anxiety   DOCUSATE SODIUM 100 MG CAPS    Take 100 mg by mouth 2 (two) times daily.   ETANERCEPT (ENBREL SURECLICK) 50 MG/ML INJECTION    Inject 50 mg into the skin once a week. On hold. Next dose due 11/30/13   LEVOTHYROXINE (SYNTHROID, LEVOTHROID) 125 MCG TABLET    Take 250 mcg by mouth daily before breakfast.   METHOCARBAMOL (ROBAXIN) 500 MG TABLET    Take 1 tablet (500 mg total) by mouth every 6 (six) hours as needed for muscle spasms.   OMEPRAZOLE (PRILOSEC) 20 MG CAPSULE    Take 20 mg by mouth daily.   OXYCODONE (OXY IR/ROXICODONE) 5 MG IMMEDIATE RELEASE TABLET  Take 1-2 (One to Two)  tablets by mouth every 4 hours as needed for pain   OYSTER SHELL (OYSTER CALCIUM) 500 MG TABS TABLET    Take 500 mg of elemental calcium by mouth 2 (two) times daily.   POLYETHYLENE GLYCOL 400 (BLINK TEARS) 0.25 % SOLN    Apply 1 drop to eye 2 (two) times daily as needed (dry eyes).   RIFAMPIN (RIFADIN) 300 MG CAPSULE    Take 1 capsule (300 mg total) by mouth every 12 (twelve) hours.   SODIUM CHLORIDE 0.9 % SOLN 250 ML WITH VANCOMYCIN 10 G SOLR 1,250 MG    Inject 1,250 mg into the vein every 12 (twelve) hours.   TERIPARATIDE, RECOMBINANT, (FORTEO [place])    Inject 20 mg into the skin daily.    TESTOSTERONE TD    Place 200 mg onto the skin every 14 (fourteen) days.   TUBERCULIN 5 UNIT/0.1ML INJECTION    Inject 5 Units into the skin once.     No Known Allergies   REVIEW OF SYSTEMS:  GENERAL: no change in appetite, no fatigue, no weight changes, no fever, chills or weakness EYES: Denies change in vision, dry eyes, eye pain, itching or discharge EARS: Denies change in hearing, ringing in ears, or earache NOSE: Denies nasal congestion or epistaxis MOUTH and THROAT: Denies oral discomfort, gingival pain or bleeding, pain from teeth or hoarseness   RESPIRATORY: no cough, SOB, DOE, wheezing, hemoptysis CARDIAC: no chest pain, or palpitations GI: no abdominal pain, diarrhea, heart burn, nausea or vomiting, +constipation GU: Denies dysuria, frequency, hematuria, incontinence, or discharge PSYCHIATRIC: Denies feeling of depression or anxiety. No report of hallucinations, insomnia, paranoia, or agitation   PHYSICAL EXAMINATION  GENERAL APPEARANCE: Well nourished. In no acute distress. Obese SKIN:  Right hip surgical incision is covered with Aquacel dressing, dry, no erythema; right cheek bruise (from a fall) HEAD: Normal in size and contour. No evidence of trauma EYES: Lids open and close normally. No blepharitis, entropion or ectropion. PERRL. Conjunctivae are clear and sclerae are white. Lenses  are without opacity EARS: Pinnae are normal. Patient hears normal voice tunes of the examiner MOUTH and THROAT: Lips are without lesions. Oral mucosa is moist and without lesions. Tongue is normal in shape, size, and color and without lesions NECK: supple, trachea midline, no neck masses, no thyroid tenderness, no thyromegaly LYMPHATICS: no LAN in the neck, no supraclavicular LAN RESPIRATORY: breathing is even & unlabored, BS CTAB CARDIAC: RRR, no murmur,no extra heart sounds, RLE 1+ edema GI: abdomen soft, normal BS, no masses, no tenderness, no hepatomegaly, no splenomegaly EXTREMITIES:  Able to move 4 extremities PSYCHIATRIC: Alert and oriented X 3. Affect and behavior are appropriate  LABS/RADIOLOGY: Labs reviewed: 02/07/16   hemoglobin A1c 5.8   ASSESSMENT/PLAN:  Loosening of prosthetic hip S/P right hip revision - for rehabilitation; continue Lovenox 40 mg subcutaneous daily till 03/30/16 then Xarelto 20 mg 1 tab daily for DVT prophylaxis;  continue Zanaflex 6 mg every 6 hours when necessary for muscle spasm; RLE TTWB; oxycodone 10 mg 1-1.5 tabs (10-15 mg) by mouth every 4 hours when necessary for pain; doxycycline 100 mg 1 capsule by mouth daily for prophylaxis; follow-up with orthopedic surgeon  CAD - stable; continue continue Ranexa ER 500 mg 1 tab PO BID, Cardizem CD 240 mg 1 capsule by mouth daily and NTG when necessary  Hyperlipidemia - continue Lipitor 80 mg 1 tab by mouth daily; check lipid panel  Hypothyroidism - continue Synthroid 75 g +  200 g by mouth daily; check TSH  PAF - rate-controlled; continue Cardizem CD 240 mg 1 capsule by mouth daily, amiodarone 100 mg 1 tab by mouth daily  Depression - mood this is stable; continue Wellbutrin XL 300 mg 1 tab by mouth daily and Cymbalta 60 mg 1 capsule by mouth 4 times a day; psychiatric consultation with [facility]  Allergic rhinitis - continue Claritin 10 mg 1 tab by mouth daily  GERD - continue Prilosec 40 mg 1 capsule by mouth  daily  Constipation - start MiraLAX 17 g by mouth twice a day and Dulcolax suppository 10 mg rectally daily when necessary  BPH - continue Flomax 0.4 mg 1 capsule by mouth daily  Low testosterone level - continue testosterone supplementation  Anxiety - continue Klonopin 1 mg 1 tab by mouth every 6 hours when necessary  Anemia, acute blood loss - continue ferrous sulfate 325 mg 1 tab by mouth daily; check CBC  Hypertension - continue Cardizem CD 240 mg 1 capsule by mouth daily    Goals of care:  Short-term rehabilitation     Durenda Age, NP [place] [phone]

## 2016-03-23 ENCOUNTER — Non-Acute Institutional Stay (SKILLED_NURSING_FACILITY): Payer: Medicare Other | Admitting: Internal Medicine

## 2016-03-23 ENCOUNTER — Encounter: Payer: Self-pay | Admitting: Internal Medicine

## 2016-03-23 DIAGNOSIS — J3089 Other allergic rhinitis: Secondary | ICD-10-CM

## 2016-03-23 DIAGNOSIS — T84029S Dislocation of unspecified internal joint prosthesis, sequela: Secondary | ICD-10-CM

## 2016-03-23 DIAGNOSIS — K219 Gastro-esophageal reflux disease without esophagitis: Secondary | ICD-10-CM

## 2016-03-23 DIAGNOSIS — F32A Depression, unspecified: Secondary | ICD-10-CM

## 2016-03-23 DIAGNOSIS — D62 Acute posthemorrhagic anemia: Secondary | ICD-10-CM | POA: Diagnosis not present

## 2016-03-23 DIAGNOSIS — E038 Other specified hypothyroidism: Secondary | ICD-10-CM | POA: Diagnosis not present

## 2016-03-23 DIAGNOSIS — K5901 Slow transit constipation: Secondary | ICD-10-CM | POA: Diagnosis not present

## 2016-03-23 DIAGNOSIS — I10 Essential (primary) hypertension: Secondary | ICD-10-CM

## 2016-03-23 DIAGNOSIS — F411 Generalized anxiety disorder: Secondary | ICD-10-CM | POA: Diagnosis not present

## 2016-03-23 DIAGNOSIS — Z96649 Presence of unspecified artificial hip joint: Secondary | ICD-10-CM

## 2016-03-23 DIAGNOSIS — I4891 Unspecified atrial fibrillation: Secondary | ICD-10-CM | POA: Diagnosis not present

## 2016-03-23 DIAGNOSIS — D696 Thrombocytopenia, unspecified: Secondary | ICD-10-CM

## 2016-03-23 DIAGNOSIS — R2681 Unsteadiness on feet: Secondary | ICD-10-CM

## 2016-03-23 DIAGNOSIS — I2510 Atherosclerotic heart disease of native coronary artery without angina pectoris: Secondary | ICD-10-CM

## 2016-03-23 DIAGNOSIS — E785 Hyperlipidemia, unspecified: Secondary | ICD-10-CM | POA: Diagnosis not present

## 2016-03-23 DIAGNOSIS — N400 Enlarged prostate without lower urinary tract symptoms: Secondary | ICD-10-CM | POA: Diagnosis not present

## 2016-03-23 DIAGNOSIS — F329 Major depressive disorder, single episode, unspecified: Secondary | ICD-10-CM | POA: Diagnosis not present

## 2016-03-23 LAB — CBC AND DIFFERENTIAL
HEMATOCRIT: 34 % — AB (ref 41–53)
HEMOGLOBIN: 10.8 g/dL — AB (ref 13.5–17.5)
Neutrophils Absolute: 4 /uL
Platelets: 195 10*3/uL (ref 150–399)
WBC: 6.1 10*3/mL

## 2016-03-23 LAB — LIPID PANEL
CHOLESTEROL: 116 mg/dL (ref 0–200)
HDL: 40 mg/dL (ref 35–70)
LDL Cholesterol: 59 mg/dL
Triglycerides: 87 mg/dL (ref 40–160)

## 2016-03-23 LAB — HEPATIC FUNCTION PANEL
ALT: 15 U/L (ref 10–40)
AST: 20 U/L (ref 14–40)
Alkaline Phosphatase: 70 U/L (ref 25–125)
Bilirubin, Total: 0.5 mg/dL

## 2016-03-23 LAB — BASIC METABOLIC PANEL
BUN: 13 mg/dL (ref 4–21)
CREATININE: 1 mg/dL (ref 0.6–1.3)
GLUCOSE: 98 mg/dL
POTASSIUM: 4 mmol/L (ref 3.4–5.3)
SODIUM: 139 mmol/L (ref 137–147)

## 2016-03-23 LAB — TSH: TSH: 0.29 u[IU]/mL — AB (ref 0.41–5.90)

## 2016-03-23 NOTE — Progress Notes (Signed)
LOCATION: [place]  PCP: Janie Morning, DO   Code Status: Full Code  Goals of care: Advanced Directive information Advanced Directives 02/26/2016  Does patient have an advance directive? No;Yes  Type of Paramedic of [place];Living will  Does patient want to make changes to advanced directive? -  Would patient like information on creating an advanced directive? -  Pre-existing out of facility DNR order (yellow form or pink MOST form) -       Extended Emergency Contact Information Primary Emergency Contact: Ruggles,Michael Address: [address]          [place], [place] [address] Johnnette Litter of [place] Phone: [phone] Work Phone: [phone] Mobile Phone: [phone] Relation: Significant other   No Known Allergies  Chief Complaint  Patient presents with  . New Admit To SNF    New Admission     HPI:  Patient is a 70 y.o. male seen today for short term rehabilitation post hospital admission from 03/16/16-03/19/16 for right hip revision of his prosthetic hip. He is seen in his room today. His pain is under control with current pain medication. He has been anxious lately.   Review of Systems:  Constitutional: Negative for fever, chills, diaphoresis. Energy level slowly coming back. Overall feeling weak and tired. HENT: Negative for headache, congestion, nasal discharge, hearing loss, sore throat, difficulty swallowing.   Eyes: Negative for blurred vision, double vision and discharge.  Respiratory: Negative for cough, shortness of breath and wheezing.   Cardiovascular: Negative for chest pain, palpitations, leg swelling.  Gastrointestinal: Negative for heartburn, nausea, vomiting, abdominal pain. Last bowel movement was this morning. Genitourinary: Negative for dysuria and flank pain.  Musculoskeletal: Negative for back pain, fall in the facility.  Skin: Negative for itching, rash.  Neurological: Negative for  dizziness. Psychiatric/Behavioral: Negative for depression.   Past Medical History  Diagnosis Date  . Acid reflux   . Thyroid disease     HX GRAVES DISEASE  . Low testosterone, possible hypogonadism 10/01/2012  . Lumbago   . High cholesterol   . Anxiety   . Depression   . CAD (coronary artery disease)   . Arthritis   . Environmental allergies   . Vertebral compression fracture (HCC)     L1-wears brace  . Sleep apnea     cpap machine-settings 17  . Transfusion history     s/p 12'13 hip surgery  . A-fib (HCC)     hx.- sinus rhythm now  . Fracture of wrist 10/23/13    LEFT  . Osteoporosis   . Metabolic bone disease   . Plaque psoriasis    Past Surgical History  Procedure Laterality Date  . Knee surgery Bilateral 2012    Total knee replacements  . Appendectomy    . Right femur surgery  05/2012  . Orif wrist fracture  10/02/2012    Procedure: OPEN REDUCTION INTERNAL FIXATION (ORIF) WRIST FRACTURE;  Surgeon: Roseanne Kaufman, MD;  Location: [facility];  Service: Orthopedics;  Laterality: Right;  WITH   ANTIBIOTIC  CEMENT  . Total hip revision  10/05/2012    Procedure: TOTAL HIP REVISION;  Surgeon: [name], MD;  Location: [facility];  Service: Orthopedics;  Laterality: Right;  RIGHT TOTAL HIP REVISION  . Hardware removal  10/05/2012    Procedure: HARDWARE REMOVAL;  Surgeon: [name], MD;  Location: [facility];  Service: Orthopedics;  Laterality: Right;  REMOVING  STRYKER  GAMMA NAIL  . Wrist fracture surgery  05/2012  . Orif tibia fracture Right 02/06/2013    Procedure: OPEN REDUCTION INTERNAL FIXATION (ORIF) TIBIA FRACTURE WITH IM ROD FIBULA;  Surgeon: Rozanna Box, MD;  Location: [place];  Service: Orthopedics;  Laterality: Right;  . Cardiac catheterization  2013  . Orif tibia fracture Right 07/03/2013    Procedure: RIGHT TIBIA NON UNION REPAIR ;  Surgeon: Rozanna Box, MD;  Location: [facility];  Service: Orthopedics;  Laterality: Right;  . Hardware removal Right 07/03/2013     Procedure: HARDWARE REMOVAL RIGHT TIBIA ;  Surgeon: Rozanna Box, MD;  Location: [facility];  Service: Orthopedics;  Laterality: Right;  . Joint replacement      hip-right x2  . Tonsillectomy    . Total hip revision Right 09/17/2013    Procedure: REVISION RIGHT TOTAL HIP ARTHROPLASTY ;  Surgeon: [name], MD;  Location: [facility];  Service: Orthopedics;  Laterality: Right;  . Hip closed reduction Right 10/15/2013    Procedure: CLOSED MANIPULATION HIP;  Surgeon: [name], MD;  Location: [facility];  Service: Orthopedics;  Laterality: Right;  . Total hip revision Right 10/26/2013    Procedure: REVISION RIGHT TOTAL HIP ARTHROPLASTY;  Surgeon: [name], MD;  Location[facility];  Service: Orthopedics;  Laterality: Right;  . Orif wrist fracture Left 10/28/2013    Procedure: OPEN REDUCTION INTERNAL FIXATION (ORIF) WRIST FRACTURE with allograft;  Surgeon: Roseanne Kaufman, MD;  Location[facility];  Service: Orthopedics;  Laterality: Left;  DVR Plate  . Incision and drainage hip Right 11/16/2013    Procedure: IRRIGATION AND DEBRIDEMENT RIGHT HIP;  Surgeon: [name], MD;  Location[facility];  Service: Orthopedics;  Laterality: Right;   Social History:   reports that he quit smoking about 3 years ago. He has never used smokeless tobacco. He reports that he drinks alcohol. He reports that he does not use illicit drugs.  Family History  Problem Relation Age of Onset  . CAD Father 61  . Arthritis Mother     Medications:   Medication List       This list is accurate as of: 03/23/16  2:07 PM.  Always use your most recent med list.               amiodarone 100 MG tablet  Commonly known as:  PACERONE  Take 100 mg by mouth daily.     atorvastatin 80 MG tablet  Commonly known as:  LIPITOR  Take 80 mg by mouth every evening.     buPROPion 300 MG 24 hr tablet  Commonly known as:  WELLBUTRIN XL  Take 300 mg by mouth daily. Take in the AM     calcium carbonate 1250 (500 Ca) MG tablet  Commonly  known as:  OS-CAL - dosed in mg of elemental calcium  Take 1 tablet by mouth 2 (two) times daily with a meal.     clonazePAM 1 MG tablet  Commonly known as:  KLONOPIN  Take 1 mg by mouth every 6 (six) hours as needed for anxiety.     diltiazem 240 MG 24 hr capsule  Commonly known as:  CARDIZEM CD  Take 240 mg by mouth every morning.     doxycycline 100 MG tablet  Commonly known as:  VIBRA-TABS  Take 100 mg by mouth daily.     DULoxetine 60 MG capsule  Commonly known as:  CYMBALTA  Take 60 mg by mouth 4 (four) times daily.  enoxaparin 40 MG/0.4ML injection  Commonly known as:  LOVENOX  Inject 40 mg into the skin daily.     ferrous sulfate 325 (65 FE) MG tablet  Take 325 mg by mouth daily with breakfast.     levothyroxine 200 MCG tablet  Commonly known as:  SYNTHROID, LEVOTHROID  Take 200 mcg by mouth daily before breakfast. Take with a 75 mcg tablet to = 275 mcg     levothyroxine 75 MCG tablet  Commonly known as:  SYNTHROID, LEVOTHROID  Take 75 mcg by mouth daily before breakfast. Take with 200 mcg tablet to = 275 mcg qd     loratadine 10 MG tablet  Commonly known as:  CLARITIN  Take 10 mg by mouth at bedtime.     multivitamin with minerals Tabs tablet  Take 1 tablet by mouth daily.     nitroGLYCERIN 0.4 MG SL tablet  Commonly known as:  NITROSTAT  Place 0.4 mg under the tongue every 5 (five) minutes as needed for chest pain. x3 doses as needed for chest pain     omeprazole 40 MG capsule  Commonly known as:  PRILOSEC  Take 40 mg by mouth daily.     Oxycodone HCl 10 MG Tabs  Take 10-15 mg by mouth every 4 (four) hours as needed. Take 10 mg for mild to moderate pain; take 1.5 tablets (15 mg) for severe pain     polyethylene glycol packet  Commonly known as:  MIRALAX / GLYCOLAX  Take 17 g by mouth 2 (two) times daily. Constipation.     ranolazine 500 MG 12 hr tablet  Commonly known as:  RANEXA  Take 500 mg by mouth 2 (two) times daily.     saccharomyces  boulardii 250 MG capsule  Commonly known as:  FLORASTOR  Take 250 mg by mouth daily.     sennosides-docusate sodium 8.6-50 MG tablet  Commonly known as:  SENOKOT-S  Take 2 tablets by mouth 2 (two) times daily.     tamsulosin 0.4 MG Caps capsule  Commonly known as:  FLOMAX  Take 0.4 mg by mouth every evening.     testosterone cypionate 200 MG/ML injection  Commonly known as:  DEPOTESTOSTERONE CYPIONATE  Inject 200mg  (84ml) intramuscularly every 14 days     tizanidine 6 MG capsule  Commonly known as:  ZANAFLEX  Take 6 mg by mouth every 6 (six) hours as needed for muscle spasms.     vitamin C 1000 MG tablet  Take 1,000 mg by mouth daily.     Vitamin D3 5000 units Caps  Take 5,000 Units by mouth daily.     XARELTO 20 MG Tabs tablet  Generic drug:  rivaroxaban  Take 20 mg by mouth daily with supper.        Immunizations: Immunization History  Administered Date(s) Administered  . Influenza,inj,Quad PF,36+ Mos 07/05/2013  . PPD Test 03/19/2016     Physical Exam: Filed Vitals:   03/23/16 1357  BP: 164/89  Pulse: 104  Temp: 97.8 F (36.6 C)  TempSrc: Oral  Resp: 20  Height: 6\' 3"  (1.905 m)  Weight: 285 lb (129.275 kg)  SpO2: 97%   Body mass index is 35.62 kg/(m^2).  General- elderly obese male in no acute distress Head- atraumatic, normocephalic Eyes- PERRLA, EOMI, no pallor, no icterus Neck- no lymphadenopathy, no thyromegaly, no jugular vein distension Chest- no chest wall deformities, no chest wall tenderness Cardiovascular- normal s1,s2, no murmur, trace leg edema Respiratory- bilateral clear to auscultation, no  wheeze, no rhonchi, no crackles Abdomen- bowel sounds present, soft, non tender Musculoskeletal- able to move all 4 extremities, limited right leg ROM at hip, dressing to right hip clean and dry Neurological- alert and oriented to person, place and time Psychiatry- anxious     Labs reviewed: Basic Metabolic Panel:  Recent Labs  03/17/16  NA  139  K 4.6  BUN 23*  CREATININE 1.2   CBC:  Recent Labs  03/17/16  WBC 7.7  HGB 12.9*  HCT 39*  PLT 136*     Assessment/Plan   Unsteady gait Post right hip revision. Will have patient work with PT/OT as tolerated to regain strength and restore function.  Fall precautions are in place.  Dislocation of hip prosthesis  S/P right hip revision. Has orthopedic follow up. Will have him work with physical therapy and occupational therapy team to help with gait training and muscle strengthening exercises.fall precautions. Skin care. Encourage to be out of bed. continue Lovenox 40 mg daily until 03/30/16 then Xarelto 20 mg daily for DVT prophylaxis. Continue oxyIR 10-15 mg tab q4h prn pain. Continue doxycycline for chronic prophylaxis. Continue zanaflex for muscle spasm. RLE TTWB for now.   Blood loss anemia Post op, monitor cbc, continue ferrous sulfate for now  Thrombocytopenia No bleed at present, given him on lovenox, check platelet count  Anxiety disorder Currently on clonazepam 1 mg q6h prn pain. Change this to clonazepam 1 mg q4h prn and monitor  HTN Continue cardizem for now and check bp bid x 1 week  afib Rate controlled. Continue cardizem 240 mg daily and amiodarone 100 mg daily and monitor  CAD  continue ranexa and prn NTG. Continue lipitor  Hypothyroidism Continue synthroid for now  Allergic rhinitis continue Claritin 10 mg daily  Constipation  Continue miralax for now and monitor  BPH continue Flomax 0.4 mg daily  Hyperlipidemia  continue Lipitor 80 mg daily  Chronic Depression continue Wellbutrin XL 300 mg daily and Cymbalta 60 mg qid  gerd Continue prilosec for now   Goals of care: short term rehabilitation   Labs/tests ordered: cbc, cmp  Family/ staff Communication: reviewed care plan with patient and nursing supervisor    Blanchie Serve, MD Internal Medicine [address] [place],  [place] [address] Cell Phone (Monday-Friday 8 am - 5 pm): [phone] On Call: [phone] and follow prompts after 5 pm and on weekends Office Phone: [phone] Office Fax: [phone]

## 2016-03-25 ENCOUNTER — Encounter: Payer: Self-pay | Admitting: Adult Health

## 2016-03-25 ENCOUNTER — Non-Acute Institutional Stay (SKILLED_NURSING_FACILITY): Payer: Medicare Other | Admitting: Adult Health

## 2016-03-25 DIAGNOSIS — E43 Unspecified severe protein-calorie malnutrition: Secondary | ICD-10-CM

## 2016-03-25 DIAGNOSIS — E038 Other specified hypothyroidism: Secondary | ICD-10-CM

## 2016-03-25 NOTE — Progress Notes (Signed)
Patient ID: [name], male   DOB: [birth year], 70 y.o.   MRN: [id number]    DATE:  03/25/2016   MRN:  [id number]  BIRTHDAY: Oct 17, 1945  Facility:  [facility] Location:  [place] [facility] [place] Room Number: [id number]  LEVEL OF CARE:  SNF (31)  Contact Information    Name Relation Home Work Mobile   Ruggles,Michael Significant other [phone]       Code Status History    Date Active Date Inactive Code Status Order ID Comments User Context   11/15/2013  5:00 PM 11/19/2013  6:24 PM Full Code [id number]  Lucille Passy Babish, PA-C Inpatient   10/26/2013  5:09 PM 10/30/2013  5:39 PM Full Code [id number]  Lucille Passy [place], PA-C Inpatient   09/17/2013  6:56 PM 09/20/2013  4:22 PM Full Code [id number]  Pricilla Loveless, PA-C Inpatient   02/21/2013  3:22 AM 02/24/2013  5:07 PM Full Code [id number]  [name], MD Inpatient   02/03/2013 12:49 AM 02/09/2013  6:25 PM Full Code [id number]  [name] Schooling, MD ED   09/29/2012  3:11 AM 10/10/2012  6:14 PM Full Code [id number]  Abbott Pao, RN Inpatient       Chief Complaint  Patient presents with  . Acute Visit    Hypothyroidism, protein calorie malnutrition    HISTORY OF PRESENT ILLNESS:  This is a 70 year old male who was noted to have tsh 0.287 and albumin 2.89. Patient claimed that he used to take Synthroid 300 mcg and was just decreased to 275 mcg 2 weeks ago while in the hospital. Discussed with him regarding Synthroid dosage needing to be cut again. He said that he does not like his Synthroid dosage to be changed again and that he'll make an appointment with his PCP upon discharge to have his tsh checked.  He has been admitted to [facility] on 03/19/16 from [facility]. He has PMH of Grave's disease, acquired hypothyroidism, CAD, psoriasis and paroxysmal atrial fibrillation on Xarelto. He had loosening of right prosthetic hip for which he had right hip  revision.  He has been admitted for a short-term rehabilitation.   PAST MEDICAL HISTORY:  Past Medical History  Diagnosis Date  . Acid reflux   . Thyroid disease     HX GRAVES DISEASE  . Low testosterone, possible hypogonadism 10/01/2012  . Lumbago   . High cholesterol   . Anxiety   . Depression   . CAD (coronary artery disease)   . Arthritis   . Environmental allergies   . Vertebral compression fracture (HCC)     L1-wears brace  . Sleep apnea     cpap machine-settings 17  . Transfusion history     s/p 12'13 hip surgery  . A-fib (HCC)     hx.- sinus rhythm now  . Fracture of wrist 10/23/13    LEFT  . Osteoporosis   . Metabolic bone disease   . Plaque psoriasis   . Unsteady gait   . Acute blood loss anemia   . HLD (hyperlipidemia)   . BPH (benign prostatic hyperplasia)   . Thrombocytopenia (HCC)      CURRENT MEDICATIONS: Reviewed  Patient's Medications  New Prescriptions   No medications on file  Previous Medications   AMIODARONE (PACERONE) 100 MG TABLET    Take 100 mg by mouth daily.   ASCORBIC ACID (VITAMIN C) 1000 MG TABLET    Take 1,000 mg by  mouth daily.   ATORVASTATIN (LIPITOR) 80 MG TABLET    Take 80 mg by mouth every evening.    BISACODYL (DULCOLAX) 10 MG SUPPOSITORY    Place 10 mg rectally daily as needed for moderate constipation.   BUPROPION (WELLBUTRIN XL) 300 MG 24 HR TABLET    Take 300 mg by mouth daily. Take in the AM   CALCIUM CARBONATE (OS-CAL - DOSED IN MG OF ELEMENTAL CALCIUM) 1250 (500 CA) MG TABLET    Take 1 tablet by mouth 2 (two) times daily with a meal.   CHOLECALCIFEROL (VITAMIN D3) 5000 UNITS CAPS    Take 5,000 Units by mouth daily.   CLONAZEPAM (KLONOPIN) 1 MG TABLET    Take 1 mg by mouth every 6 (six) hours as needed for anxiety.   DILTIAZEM (CARDIZEM CD) 240 MG 24 HR CAPSULE    Take 240 mg by mouth every morning.    DOXYCYCLINE (VIBRA-TABS) 100 MG TABLET    Take 100 mg by mouth daily.   DULOXETINE (CYMBALTA) 60 MG CAPSULE    Take 60 mg  by mouth 2 (two) times daily.    ENOXAPARIN (LOVENOX) 40 MG/0.4ML INJECTION    Inject 40 mg into the skin daily.   FERROUS SULFATE 325 (65 FE) MG TABLET    Take 325 mg by mouth daily with breakfast.    LEVOTHYROXINE (SYNTHROID, LEVOTHROID) 200 MCG TABLET    Take 200 mcg by mouth daily before breakfast. Take with a 75 mcg tablet to = 275 mcg   LEVOTHYROXINE (SYNTHROID, LEVOTHROID) 75 MCG TABLET    Take 75 mcg by mouth daily before breakfast. Take with 200 mcg tablet to = 275 mcg qd   LORATADINE (CLARITIN) 10 MG TABLET    Take 10 mg by mouth at bedtime.    MULTIPLE VITAMIN (MULTIVITAMIN WITH MINERALS) TABS TABLET    Take 1 tablet by mouth daily.   NITROGLYCERIN (NITROSTAT) 0.4 MG SL TABLET    Place 0.4 mg under the tongue every 5 (five) minutes as needed for chest pain. x3 doses as needed for chest pain   OMEPRAZOLE (PRILOSEC) 40 MG CAPSULE    Take 40 mg by mouth daily.   OXYCODONE HCL 10 MG TABS    Take 10-15 mg by mouth every 4 (four) hours as needed. Take 10 mg for mild to moderate pain; take 1.5 tablets (15 mg) for severe pain   POLYETHYLENE GLYCOL (MIRALAX / GLYCOLAX) PACKET    Take 17 g by mouth 2 (two) times daily. Constipation.   RANOLAZINE (RANEXA) 500 MG 12 HR TABLET    Take 500 mg by mouth 2 (two) times daily.   RIVAROXABAN (XARELTO) 20 MG TABS    Take 20 mg by mouth daily with supper.   SACCHAROMYCES BOULARDII (FLORASTOR) 250 MG CAPSULE    Take 250 mg by mouth daily.   SENNOSIDES-DOCUSATE SODIUM (SENOKOT-S) 8.6-50 MG TABLET    Take 2 tablets by mouth 2 (two) times daily.   TAMSULOSIN HCL (FLOMAX) 0.4 MG CAPS    Take 0.4 mg by mouth every evening.    TESTOSTERONE CYPIONATE (DEPOTESTOTERONE CYPIONATE) 200 MG/ML INJECTION    Inject 200mg  (34ml) intramuscularly every 14 days   TIZANIDINE (ZANAFLEX) 6 MG CAPSULE    Take 6 mg by mouth every 6 (six) hours as needed for muscle spasms.  Modified Medications   No medications on file  Discontinued Medications   No medications on file     No  Known Allergies   REVIEW OF  SYSTEMS:  GENERAL: no change in appetite, no fatigue, no weight changes, no fever, chills or weakness EYES: Denies change in vision, dry eyes, eye pain, itching or discharge EARS: Denies change in hearing, ringing in ears, or earache NOSE: Denies nasal congestion or epistaxis MOUTH and THROAT: Denies oral discomfort, gingival pain or bleeding, pain from teeth or hoarseness   RESPIRATORY: no cough, SOB, DOE, wheezing, hemoptysis CARDIAC: no chest pain, or palpitations GI: no abdominal pain, diarrhea, heart burn, nausea or vomiting, +constipation GU: Denies dysuria, frequency, hematuria, incontinence, or discharge PSYCHIATRIC: Denies feeling of depression or anxiety. No report of hallucinations, insomnia, paranoia, or agitation   PHYSICAL EXAMINATION  GENERAL APPEARANCE: Well nourished. In no acute distress. Obese SKIN:  Right hip surgical incision is covered with Aquacel dressing, dry, no erythema; right cheek bruise (from a fall) HEAD: Normal in size and contour. No evidence of trauma EYES: Lids open and close normally. No blepharitis, entropion or ectropion. PERRL. Conjunctivae are clear and sclerae are white. Lenses are without opacity EARS: Pinnae are normal. Patient hears normal voice tunes of the examiner MOUTH and THROAT: Lips are without lesions. Oral mucosa is moist and without lesions. Tongue is normal in shape, size, and color and without lesions NECK: supple, trachea midline, no neck masses, no thyroid tenderness, no thyromegaly LYMPHATICS: no LAN in the neck, no supraclavicular LAN RESPIRATORY: breathing is even & unlabored, BS CTAB CARDIAC: RRR, no murmur,no extra heart sounds, RLE 1+ edema GI: abdomen soft, normal BS, no masses, no tenderness, no hepatomegaly, no splenomegaly EXTREMITIES:  Able to move 4 extremities PSYCHIATRIC: Alert and oriented X 3. Affect and behavior are appropriate  LABS/RADIOLOGY: Labs reviewed:  Basic Metabolic  Panel:  Recent Labs  03/17/16 03/23/16  NA 139 139  K 4.6 4.0  BUN 23* 13  CREATININE 1.2 1.0   Liver Function Tests:  Recent Labs  03/23/16  AST 20  ALT 15  ALKPHOS 70   CBC:  Recent Labs  03/17/16 03/23/16  WBC 7.7 6.1  NEUTROABS  --  4  HGB 12.9* 10.8*  HCT 39* 34*  PLT 136* 195   Lipid Panel:  Recent Labs  03/23/16  HDL 40   02/07/16   hemoglobin A1c 5.8   ASSESSMENT/PLAN:  Hypothyroidism - Patient refuses his Synthroid dosage to be change; continue Synthroid 200 g + 75 g by mouth daily; check TSH in 1 month Lab Results  Component Value Date   TSH 0.29* 03/23/2016    Protein calorie malnutrition, severe - albumin 2.89; start Procel 2 scoops + 8 ounces juice by mouth twice a day ; RD consult      Durenda Age, NP [place]

## 2016-03-30 ENCOUNTER — Other Ambulatory Visit: Payer: Self-pay | Admitting: *Deleted

## 2016-03-30 MED ORDER — TESTOSTERONE CYPIONATE 200 MG/ML IM SOLN: INTRAMUSCULAR | Status: DC

## 2016-03-30 NOTE — Telephone Encounter (Signed)
[facility] 

## 2016-04-26 LAB — TSH: TSH: 0.38 u[IU]/mL — AB (ref 0.41–5.90)

## 2016-04-27 ENCOUNTER — Encounter: Payer: Self-pay | Admitting: Adult Health

## 2016-04-27 ENCOUNTER — Non-Acute Institutional Stay (SKILLED_NURSING_FACILITY): Payer: Medicare Other | Admitting: Adult Health

## 2016-04-27 DIAGNOSIS — I2510 Atherosclerotic heart disease of native coronary artery without angina pectoris: Secondary | ICD-10-CM | POA: Diagnosis not present

## 2016-04-27 DIAGNOSIS — I4891 Unspecified atrial fibrillation: Secondary | ICD-10-CM

## 2016-04-27 DIAGNOSIS — I10 Essential (primary) hypertension: Secondary | ICD-10-CM

## 2016-04-27 DIAGNOSIS — E038 Other specified hypothyroidism: Secondary | ICD-10-CM

## 2016-04-27 DIAGNOSIS — D62 Acute posthemorrhagic anemia: Secondary | ICD-10-CM

## 2016-04-27 DIAGNOSIS — E291 Testicular hypofunction: Secondary | ICD-10-CM

## 2016-04-27 DIAGNOSIS — N400 Enlarged prostate without lower urinary tract symptoms: Secondary | ICD-10-CM

## 2016-04-27 DIAGNOSIS — Z96649 Presence of unspecified artificial hip joint: Secondary | ICD-10-CM

## 2016-04-27 DIAGNOSIS — E785 Hyperlipidemia, unspecified: Secondary | ICD-10-CM | POA: Diagnosis not present

## 2016-04-27 DIAGNOSIS — T84038S Mechanical loosening of other internal prosthetic joint, sequela: Secondary | ICD-10-CM

## 2016-04-27 DIAGNOSIS — K219 Gastro-esophageal reflux disease without esophagitis: Secondary | ICD-10-CM

## 2016-04-27 DIAGNOSIS — K5901 Slow transit constipation: Secondary | ICD-10-CM

## 2016-04-27 DIAGNOSIS — E43 Unspecified severe protein-calorie malnutrition: Secondary | ICD-10-CM | POA: Diagnosis not present

## 2016-04-27 DIAGNOSIS — J3089 Other allergic rhinitis: Secondary | ICD-10-CM

## 2016-04-27 DIAGNOSIS — F329 Major depressive disorder, single episode, unspecified: Secondary | ICD-10-CM

## 2016-04-27 DIAGNOSIS — R7989 Other specified abnormal findings of blood chemistry: Secondary | ICD-10-CM

## 2016-04-27 DIAGNOSIS — F419 Anxiety disorder, unspecified: Secondary | ICD-10-CM

## 2016-04-27 DIAGNOSIS — R2681 Unsteadiness on feet: Secondary | ICD-10-CM | POA: Diagnosis not present

## 2016-04-27 NOTE — Progress Notes (Signed)
Patient ID: [name], male   DOB: 1946-06-27, 70 y.o.   MRN: [id number]    DATE:  04/27/2016   MRN:  [id number]  BIRTHDAY: August 05, 1946  Facility:  Nursing Home Location:  [place] Room Number: [id number]  LEVEL OF CARE:  SNF (31)  Contact Information    Name Relation Home Work Mobile   Ruggles,Michael Significant other [phone]       Code Status History    Date Active Date Inactive Code Status Order ID Comments User Context   11/15/2013  5:00 PM 11/19/2013  6:24 PM Full Code [id number]  Lucille Passy [place], PA-C Inpatient   10/26/2013  5:09 PM 10/30/2013  5:39 PM Full Code [id number]  Lucille Passy [place], PA-C Inpatient   09/17/2013  6:56 PM 09/20/2013  4:22 PM Full Code [id number]  Pricilla Loveless, PA-C Inpatient   02/21/2013  3:22 AM 02/24/2013  5:07 PM Full Code [id number]  [name], MD Inpatient   02/03/2013 12:49 AM 02/09/2013  6:25 PM Full Code [id number]  [name] Schooling, MD ED   09/29/2012  3:11 AM 10/10/2012  6:14 PM Full Code [id number]  Abbott Pao, RN Inpatient       Chief Complaint  Patient presents with  . Medical Management of Chronic Issues    HISTORY OF PRESENT ILLNESS:  This is a 70 year old male who is being seen for a routine visit.  He has requested for his Miralax and Senna to be PRN instead of daily. He was recently discharged from OT and currently having RNP. Levothyroxine was recently decreased to 200 mcg daily and for repeat tsh. He was recently started on Procel for supplementation. Klonopin was recently changed to 1 mg QID and Cymbalta to 60 mg daily  He has been admitted to [facility] on 03/19/16 from [facility]. He has PMH of Grave's disease, acquired hypothyroidism, CAD, psoriasis and paroxysmal atrial fibrillation on Xarelto. He had Loosening of right prosthetic hip for which he had right hip revision.   PAST MEDICAL HISTORY:  Past Medical History   Diagnosis Date  . Acid reflux   . Thyroid disease     HX GRAVES DISEASE  . Low testosterone, possible hypogonadism 10/01/2012  . Lumbago   . High cholesterol   . Anxiety   . Depression   . CAD (coronary artery disease)   . Arthritis   . Environmental allergies   . Vertebral compression fracture (HCC)     L1-wears brace  . Sleep apnea     cpap machine-settings 17  . Transfusion history     s/p 12'13 hip surgery  . A-fib (HCC)     hx.- sinus rhythm now  . Fracture of wrist 10/23/13    LEFT  . Osteoporosis   . Metabolic bone disease   . Plaque psoriasis   . Unsteady gait   . Acute blood loss anemia   . HLD (hyperlipidemia)   . BPH (benign prostatic hyperplasia)   . Thrombocytopenia ([place])   . Unsteady gait   . Dislocation of internal right hip prosthesis, sequela      CURRENT MEDICATIONS: Reviewed  Patient's Medications  New Prescriptions   No medications on file  Previous Medications   AMIODARONE (PACERONE) 100 MG TABLET    Take 100 mg by mouth daily.   ASCORBIC ACID (VITAMIN C) 1000 MG TABLET    Take 1,000 mg by mouth daily.   ATORVASTATIN (LIPITOR)  80 MG TABLET    Take 80 mg by mouth every evening.    BISACODYL (DULCOLAX) 10 MG SUPPOSITORY    Place 10 mg rectally daily as needed for moderate constipation.   BUPROPION (WELLBUTRIN XL) 300 MG 24 HR TABLET    Take 300 mg by mouth daily. Take in the AM   CALCIUM CARBONATE (OS-CAL - DOSED IN MG OF ELEMENTAL CALCIUM) 1250 (500 CA) MG TABLET    Take 1 tablet by mouth 2 (two) times daily with a meal.   CHOLECALCIFEROL (VITAMIN D3) 5000 UNITS CAPS    Take 5,000 Units by mouth daily.   CLONAZEPAM (KLONOPIN) 1 MG TABLET    Take 1 mg by mouth every 4 (four) hours.    DILTIAZEM (CARDIZEM CD) 240 MG 24 HR CAPSULE    Take 240 mg by mouth every morning.    DOXYCYCLINE (VIBRA-TABS) 100 MG TABLET    Take 100 mg by mouth daily.   DULOXETINE (CYMBALTA) 60 MG CAPSULE    Take 60 mg by mouth 2 (two) times daily.    ETANERCEPT (ENBREL  SURECLICK [place])    Inject 50 mg into the skin. Inject SQ twice weekly   FERROUS SULFATE 325 (65 FE) MG TABLET    Take 325 mg by mouth daily with breakfast.    LEVOTHYROXINE (SYNTHROID, LEVOTHROID) 200 MCG TABLET    Take 200 mcg by mouth daily before breakfast.    LORATADINE (CLARITIN) 10 MG TABLET    Take 10 mg by mouth at bedtime.    MULTIPLE VITAMIN (MULTIVITAMIN WITH MINERALS) TABS TABLET    Take 1 tablet by mouth daily.   NITROGLYCERIN (NITROSTAT) 0.4 MG SL TABLET    Place 0.4 mg under the tongue every 5 (five) minutes as needed for chest pain. x3 doses as needed for chest pain   OMEPRAZOLE (PRILOSEC) 40 MG CAPSULE    Take 40 mg by mouth daily.   OXYCODONE HCL 10 MG TABS    Take 10-15 mg by mouth every 4 (four) hours as needed. Take 10 mg for mild to moderate pain; take 1.5 tablets (15 mg) for severe pain   POLYETHYLENE GLYCOL (MIRALAX / GLYCOLAX) PACKET    Take 17 g by mouth daily as needed. Constipation.   PROTEIN (PROCEL) POWD    Take 2 scoop by mouth 2 (two) times daily.   RANOLAZINE (RANEXA) 500 MG 12 HR TABLET    Take 500 mg by mouth 2 (two) times daily.    RIVAROXABAN (XARELTO) 20 MG TABS    Take 20 mg by mouth daily with supper.   SACCHAROMYCES BOULARDII (FLORASTOR) 250 MG CAPSULE    Take 250 mg by mouth daily.   SENNOSIDES-DOCUSATE SODIUM (SENOKOT-S) 8.6-50 MG TABLET    Take 2 tablets by mouth daily as needed.    TAMSULOSIN HCL (FLOMAX) 0.4 MG CAPS    Take 0.4 mg by mouth every evening.    TESTOSTERONE CYPIONATE (DEPOTESTOSTERONE CYPIONATE) 200 MG/ML INJECTION    Inject 200mg  (17ml) intramuscularly every 14 days   TIZANIDINE (ZANAFLEX) 6 MG CAPSULE    Take 6 mg by mouth every 6 (six) hours as needed for muscle spasms.  Modified Medications   No medications on file  Discontinued Medications   ENOXAPARIN (LOVENOX) 40 MG/0.4ML INJECTION    Inject 40 mg into the skin daily.   LEVOTHYROXINE (SYNTHROID, LEVOTHROID) 75 MCG TABLET    Take 75 mcg by mouth daily before breakfast. Take with 200  mcg tablet to = 275 mcg  qd     Allergies  Allergen Reactions  . Codeine      REVIEW OF SYSTEMS:  GENERAL: no change in appetite, no fatigue, no weight changes, no fever, chills or weakness EYES: Denies change in vision, dry eyes, eye pain, itching or discharge EARS: Denies change in hearing, ringing in ears, or earache NOSE: Denies nasal congestion or epistaxis MOUTH and THROAT: Denies oral discomfort, gingival pain or bleeding, pain from teeth or hoarseness   RESPIRATORY: no cough, SOB, DOE, wheezing, hemoptysis CARDIAC: no chest pain, or palpitations GI: no abdominal pain, diarrhea, heart burn, nausea or vomiting GU: Denies dysuria, frequency, hematuria, incontinence, or discharge PSYCHIATRIC: Denies feeling of depression or anxiety. No report of hallucinations, insomnia, paranoia, or agitation   PHYSICAL EXAMINATION  GENERAL APPEARANCE: Well nourished. In no acute distress. Obese SKIN:  Right hip surgical incision is healed HEAD: Normal in size and contour. No evidence of trauma EYES: Lids open and close normally. No blepharitis, entropion or ectropion. PERRL. Conjunctivae are clear and sclerae are white. Lenses are without opacity EARS: Pinnae are normal. Patient hears normal voice tunes of the examiner MOUTH and THROAT: Lips are without lesions. Oral mucosa is moist and without lesions. Tongue is normal in shape, size, and color and without lesions NECK: supple, trachea midline, no neck masses, no thyroid tenderness, no thyromegaly LYMPHATICS: no LAN in the neck, no supraclavicular LAN RESPIRATORY: breathing is even & unlabored, BS CTAB CARDIAC: RRR, no murmur,no extra heart sounds GI: abdomen soft, normal BS, no masses, no tenderness, no hepatomegaly, no splenomegaly EXTREMITIES:  Able to move 4 extremities PSYCHIATRIC: Alert and oriented X 3. Affect and behavior are appropriate  LABS/RADIOLOGY: Labs reviewed: 02/07/16   hemoglobin A1c 5.8 Labs reviewed: Basic  Metabolic Panel:  Recent Labs  03/17/16 03/23/16  NA 139 139  K 4.6 4.0  BUN 23* 13  CREATININE 1.2 1.0   Liver Function Tests:  Recent Labs  03/23/16  AST 20  ALT 15  ALKPHOS 70   CBC:  Recent Labs  03/17/16 03/23/16  WBC 7.7 6.1  NEUTROABS  --  4  HGB 12.9* 10.8*  HCT 39* 34*  PLT 136* 195   Lipid Panel:  Recent Labs  03/23/16  HDL 40      ASSESSMENT/PLAN:  Unsteady gait - continue PT and RNP,  fall precaution   Loosening of prosthetic hip S/P right hip revision - currently having RNP and PT; continue Xarelto 20 mg 1 tab daily for DVT prophylaxis;  continue Zanaflex 6 mg every 6 hours when necessary for muscle spasm; RLE TTWB; oxycodone 10 mg 1-1.5 tabs (10-15 mg) by mouth every 4 hours when necessary for pain; doxycycline 100 mg 1 capsule by mouth daily for prophylaxis; follow-up with orthopedic surgeon  CAD - stable; continue continue Ranexa ER 500 mg 1 tab PO BID, Cardizem CD 240 mg 1 capsule by mouth daily and NTG when necessary  Hyperlipidemia - continue Lipitor 80 mg 1 tab by mouth daily; check lipid panel  Hypothyroidism - recently decreased Synthroid to  200 g by mouth daily; for repeat TSH  PAF - rate-controlled; continue Cardizem CD 240 mg 1 capsule by mouth daily, amiodarone 100 mg 1 tab by mouth daily  Depression - mood this is stable; continue Wellbutrin XL 300 mg 1 tab by mouth daily and Cymbalta 60 mg 1 capsule by mouth daily  Allergic rhinitis - continue Claritin 10 mg 1 tab by mouth daily  GERD - continue Prilosec 40 mg  1 capsule by mouth daily  Constipation - change MiraLAX  And Senna-S to PRN and continue Dulcolax suppository 10 mg rectally daily when necessary  BPH - continue Flomax 0.4 mg 1 capsule by mouth daily  Low testosterone level - continue testosterone supplementation  Anxiety - continue Klonopin 1 mg 1 tab by mouth every 6 hours when necessary  Anemia, acute blood loss - continue ferrous sulfate 325 mg 1 tab by mouth  daily Lab Results  Component Value Date   HGB 10.8* 03/23/2016   Protein-calorie malnutrition, severe  - recently started on Procel 2 scoops BID  Hypertension - continue Cardizem CD 240 mg 1 capsule by mouth daily    Goals of care:  Short-term rehabilitation     Durenda Age, NP [place] [phone]

## 2016-04-29 ENCOUNTER — Other Ambulatory Visit: Payer: Self-pay | Admitting: Nurse Practitioner

## 2016-04-29 DIAGNOSIS — M810 Age-related osteoporosis without current pathological fracture: Secondary | ICD-10-CM

## 2016-05-03 LAB — TSH: TSH: 0.52 u[IU]/mL (ref 0.41–5.90)

## 2016-05-18 ENCOUNTER — Non-Acute Institutional Stay (SKILLED_NURSING_FACILITY): Payer: Medicare Other | Admitting: Adult Health

## 2016-05-18 ENCOUNTER — Encounter: Payer: Self-pay | Admitting: Adult Health

## 2016-05-18 DIAGNOSIS — N400 Enlarged prostate without lower urinary tract symptoms: Secondary | ICD-10-CM

## 2016-05-18 DIAGNOSIS — E291 Testicular hypofunction: Secondary | ICD-10-CM

## 2016-05-18 DIAGNOSIS — E038 Other specified hypothyroidism: Secondary | ICD-10-CM | POA: Diagnosis not present

## 2016-05-18 DIAGNOSIS — R2681 Unsteadiness on feet: Secondary | ICD-10-CM

## 2016-05-18 DIAGNOSIS — I2510 Atherosclerotic heart disease of native coronary artery without angina pectoris: Secondary | ICD-10-CM

## 2016-05-18 DIAGNOSIS — T84038S Mechanical loosening of other internal prosthetic joint, sequela: Secondary | ICD-10-CM | POA: Diagnosis not present

## 2016-05-18 DIAGNOSIS — K5901 Slow transit constipation: Secondary | ICD-10-CM

## 2016-05-18 DIAGNOSIS — E785 Hyperlipidemia, unspecified: Secondary | ICD-10-CM

## 2016-05-18 DIAGNOSIS — F329 Major depressive disorder, single episode, unspecified: Secondary | ICD-10-CM | POA: Diagnosis not present

## 2016-05-18 DIAGNOSIS — E43 Unspecified severe protein-calorie malnutrition: Secondary | ICD-10-CM

## 2016-05-18 DIAGNOSIS — I10 Essential (primary) hypertension: Secondary | ICD-10-CM

## 2016-05-18 DIAGNOSIS — I480 Paroxysmal atrial fibrillation: Secondary | ICD-10-CM

## 2016-05-18 DIAGNOSIS — D62 Acute posthemorrhagic anemia: Secondary | ICD-10-CM

## 2016-05-18 DIAGNOSIS — R7989 Other specified abnormal findings of blood chemistry: Secondary | ICD-10-CM

## 2016-05-18 DIAGNOSIS — F419 Anxiety disorder, unspecified: Secondary | ICD-10-CM | POA: Diagnosis not present

## 2016-05-18 DIAGNOSIS — J3089 Other allergic rhinitis: Secondary | ICD-10-CM

## 2016-05-18 DIAGNOSIS — Z96649 Presence of unspecified artificial hip joint: Secondary | ICD-10-CM

## 2016-05-18 DIAGNOSIS — K219 Gastro-esophageal reflux disease without esophagitis: Secondary | ICD-10-CM

## 2016-05-18 NOTE — Progress Notes (Signed)
Patient ID: [name], male   DOB: 07-06-1946, 70 y.o.   MRN: [id number]     DATE:  05/18/2016   MRN:  [id number]  BIRTHDAY: 05-Apr-1946  Facility:  Nursing Home Location:  [place] Room Number: [id number]  LEVEL OF CARE:  SNF (31)  Contact Information    Name Relation Home Work Mobile   Ruggles,Michael Significant other [phone]       Code Status History    Date Active Date Inactive Code Status Order ID Comments User Context   11/15/2013  5:00 PM 11/19/2013  6:24 PM Full Code [id number]  Lucille Passy [place], PA-C Inpatient   10/26/2013  5:09 PM 10/30/2013  5:39 PM Full Code [id number]  Lucille Passy [place], PA-C Inpatient   09/17/2013  6:56 PM 09/20/2013  4:22 PM Full Code [id number]  Pricilla Loveless, PA-C Inpatient   02/21/2013  3:22 AM 02/24/2013  5:07 PM Full Code [id number]  [name], MD Inpatient   02/03/2013 12:49 AM 02/09/2013  6:25 PM Full Code [id number]  [name] Schooling, MD ED   09/29/2012  3:11 AM 10/10/2012  6:14 PM Full Code [id number]  Abbott Pao, RN Inpatient       Chief Complaint  Patient presents with  . Discharge Note    HISTORY OF PRESENT ILLNESS:  This is a 70 year old male who is for discharge home with Home health. DME:  Bariatric 3-in-1 commode  .  He has been admitted to [facility] on 03/19/16 from [facility]. He has PMH of Grave's disease, acquired hypothyroidism, CAD, psoriasis and paroxysmal atrial fibrillation on Xarelto. He had loosening of right prosthetic hip for which he had right hip revision.  Patient was admitted to this facility for short-term rehabilitation after the patient's recent hospitalization.  Patient has completed SNF rehabilitation and therapy has cleared the patient for discharge.   PAST MEDICAL HISTORY:  Past Medical History:  Diagnosis Date  . A-fib (HCC)    hx.- sinus rhythm now  . Acid reflux   . Acute blood loss anemia   .  Anxiety   . Arthritis   . BPH (benign prostatic hyperplasia)   . CAD (coronary artery disease)   . Depression   . Dislocation of internal right hip prosthesis, sequela   . Environmental allergies   . Fracture of wrist 10/23/13   LEFT  . High cholesterol   . HLD (hyperlipidemia)   . Low testosterone, possible hypogonadism 10/01/2012  . Lumbago   . Metabolic bone disease   . Osteoporosis   . Plaque psoriasis   . Sleep apnea    cpap machine-settings 17  . Thrombocytopenia ([place])   . Thyroid disease    HX GRAVES DISEASE  . Transfusion history    s/p 12'13 hip surgery  . Unsteady gait   . Unsteady gait   . Vertebral compression fracture (HCC)    L1-wears brace     CURRENT MEDICATIONS: Reviewed  Patient's Medications  New Prescriptions   No medications on file  Previous Medications   AMIODARONE (PACERONE) 100 MG TABLET    Take 100 mg by mouth daily.    ASCORBIC ACID (VITAMIN C) 1000 MG TABLET    Take 1,000 mg by mouth daily.   ATORVASTATIN (LIPITOR) 80 MG TABLET    Take 80 mg by mouth every evening.    BISACODYL (DULCOLAX) 10 MG SUPPOSITORY    Place 10 mg rectally daily as  needed for moderate constipation.   BUPROPION (WELLBUTRIN XL) 300 MG 24 HR TABLET    Take 300 mg by mouth daily. Take in the AM   CALCIUM CARBONATE (OS-CAL - DOSED IN MG OF ELEMENTAL CALCIUM) 1250 (500 CA) MG TABLET    Take 1 tablet by mouth 2 (two) times daily with a meal.   CHOLECALCIFEROL (VITAMIN D3) 5000 UNITS CAPS    Take 5,000 Units by mouth daily.   CLONAZEPAM (KLONOPIN) 1 MG TABLET    Take 1 mg by mouth every 4 (four) hours.    DILTIAZEM (CARDIZEM CD) 240 MG 24 HR CAPSULE    Take 240 mg by mouth every morning.    DOXYCYCLINE (VIBRA-TABS) 100 MG TABLET    Take 100 mg by mouth daily.   DULOXETINE (CYMBALTA) 60 MG CAPSULE    Take 60 mg by mouth 2 (two) times daily.    ETANERCEPT (ENBREL SURECLICK [place])    Inject 50 mg into the skin. Inject SQ twice weekly   FERROUS SULFATE 325 (65 FE) MG TABLET    Take  325 mg by mouth daily with breakfast.    LEVOTHYROXINE (SYNTHROID, LEVOTHROID) 200 MCG TABLET    Take 200 mcg by mouth daily before breakfast.    LORATADINE (CLARITIN) 10 MG TABLET    Take 10 mg by mouth at bedtime.    MULTIPLE VITAMIN (MULTIVITAMIN WITH MINERALS) TABS TABLET    Take 1 tablet by mouth daily.   NITROGLYCERIN (NITROSTAT) 0.4 MG SL TABLET    Place 0.4 mg under the tongue every 5 (five) minutes as needed for chest pain. x3 doses as needed for chest pain   OMEPRAZOLE (PRILOSEC) 40 MG CAPSULE    Take 40 mg by mouth daily.   OXYCODONE HCL 10 MG TABS    Take 10-15 mg by mouth every 4 (four) hours as needed. Take 10 mg for mild to moderate pain; take 1.5 tablets (15 mg) for severe pain   POLYETHYLENE GLYCOL (MIRALAX / GLYCOLAX) PACKET    Take 17 g by mouth daily as needed. Constipation.   PROTEIN (PROCEL) POWD    Take 2 scoop by mouth 2 (two) times daily.   RANOLAZINE (RANEXA) 500 MG 12 HR TABLET    Take 500 mg by mouth 2 (two) times daily.    RIVAROXABAN (XARELTO) 20 MG TABS    Take 20 mg by mouth daily with supper.    SACCHAROMYCES BOULARDII (FLORASTOR) 250 MG CAPSULE    Take 250 mg by mouth daily.   SENNOSIDES-DOCUSATE SODIUM (SENOKOT-S) 8.6-50 MG TABLET    Take 2 tablets by mouth daily as needed.    TAMSULOSIN HCL (FLOMAX) 0.4 MG CAPS    Take 0.4 mg by mouth every evening.    TESTOSTERONE CYPIONATE (DEPOTESTOSTERONE CYPIONATE) 200 MG/ML INJECTION    Inject 200mg  (85ml) intramuscularly every 14 days   TIZANIDINE (ZANAFLEX) 6 MG CAPSULE    Take 6 mg by mouth every 6 (six) hours as needed for muscle spasms.  Modified Medications   No medications on file  Discontinued Medications   No medications on file     Allergies  Allergen Reactions  . Codeine      REVIEW OF SYSTEMS:  GENERAL: no change in appetite, no fatigue, no weight changes, no fever, chills or weakness EYES: Denies change in vision, dry eyes, eye pain, itching or discharge EARS: Denies change in hearing, ringing in  ears, or earache NOSE: Denies nasal congestion or epistaxis MOUTH and THROAT: Denies oral  discomfort, gingival pain or bleeding, pain from teeth or hoarseness   RESPIRATORY: no cough, SOB, DOE, wheezing, hemoptysis CARDIAC: no chest pain, or palpitations GI: no abdominal pain, diarrhea, heart burn, nausea or vomiting GU: Denies dysuria, frequency, hematuria, incontinence, or discharge PSYCHIATRIC: Denies feeling of depression or anxiety. No report of hallucinations, insomnia, paranoia, or agitation   PHYSICAL EXAMINATION  GENERAL APPEARANCE: Well nourished. In no acute distress. Obese SKIN:  Right hip surgical incision is healed HEAD: Normal in size and contour. No evidence of trauma EYES: Lids open and close normally. No blepharitis, entropion or ectropion. PERRL. Conjunctivae are clear and sclerae are white. Lenses are without opacity EARS: Pinnae are normal. Patient hears normal voice tunes of the examiner MOUTH and THROAT: Lips are without lesions. Oral mucosa is moist and without lesions. Tongue is normal in shape, size, and color and without lesions NECK: supple, trachea midline, no neck masses, no thyroid tenderness, no thyromegaly LYMPHATICS: no LAN in the neck, no supraclavicular LAN RESPIRATORY: breathing is even & unlabored, BS CTAB CARDIAC: RRR, no murmur,no extra heart sounds GI: abdomen soft, normal BS, no masses, no tenderness, no hepatomegaly, no splenomegaly EXTREMITIES:  Able to move 4 extremities PSYCHIATRIC: Alert and oriented X 3. Affect and behavior are appropriate  LABS/RADIOLOGY: Labs reviewed: 02/07/16   hemoglobin A1c 5.8 Labs reviewed: Basic Metabolic Panel:  Recent Labs  03/17/16 03/23/16  NA 139 139  K 4.6 4.0  BUN 23* 13  CREATININE 1.2 1.0   Liver Function Tests:  Recent Labs  03/23/16  AST 20  ALT 15  ALKPHOS 70   CBC:  Recent Labs  03/17/16 03/23/16  WBC 7.7 6.1  NEUTROABS  --  4  HGB 12.9* 10.8*  HCT 39* 34*  PLT 136* 195    Lipid Panel:  Recent Labs  03/23/16  HDL 40      ASSESSMENT/PLAN:  Unsteady gait - for Home health PT and OT,  fall precaution   Loosening of prosthetic hip S/P right hip revision - currently having RNP and PT; continue Xarelto 20 mg 1 tab daily for DVT prophylaxis;  continue Zanaflex 6 mg every 6 hours when necessary for muscle spasm; RLE TTWB; oxycodone 10 mg 1-1.5 tabs (10-15 mg) by mouth every 4 hours when necessary for pain; doxycycline 100 mg 1 capsule by mouth daily for prophylaxis; follow-up with orthopedic surgeon  CAD - stable; continue continue Ranexa ER 500 mg 1 tab PO BID, Cardizem CD 240 mg 1 capsule by mouth daily and NTG when necessary  Hyperlipidemia - continue Lipitor 80 mg 1 tab by mouth daily Lab Results  Component Value Date   CHOL 116 03/23/2016   HDL 40 03/23/2016   LDLCALC 59 03/23/2016   TRIG 87 03/23/2016   CHOLHDL 1.8 07/06/2013    Hypothyroidism - recently decreased Synthroid to  200 g by mouth daily Lab Results  Component Value Date   TSH 0.52 05/03/2016    PAF - rate-controlled; continue Cardizem CD 240 mg 1 capsule by mouth daily, amiodarone 100 mg 1 tab by mouth daily  Depression - mood this is stable; continue Wellbutrin XL 300 mg 1 tab by mouth daily and Cymbalta 60 mg 1 capsule by mouth daily  Allergic rhinitis - continue Claritin 10 mg 1 tab by mouth daily  GERD - continue Prilosec 40 mg 1 capsule by mouth daily  Constipation - continue MiraLAX  and Senna-S  PRN and  Dulcolax suppository 10 mg rectally daily when necessary  BPH -  continue Flomax 0.4 mg 1 capsule by mouth daily  Low testosterone level - continue testosterone supplementation  Anxiety - continue Klonopin 1 mg 1 tab by mouth every 6 hours when necessary  Anemia, acute blood loss - continue ferrous sulfate 325 mg 1 tab by mouth daily Lab Results  Component Value Date   HGB 10.8 (A) 03/23/2016   Protein-calorie malnutrition, severe  - continue on Procel 2 scoops  BID  Hypertension - continue Cardizem CD 240 mg 1 capsule by mouth daily     I have filled out patient's discharge paperwork and written prescriptions.  Patient will receive home health PT and OT.  DME provided:  Bariatric 3-in-1 commode  Total discharge time: Greater than 30 minutes  Discharge time involved coordination of the discharge process with social worker, nursing staff and therapy department. Medical justification for home health services/DME verified.    Durenda Age, NP [employer] [phone]

## 2016-06-08 DIAGNOSIS — M810 Age-related osteoporosis without current pathological fracture: Secondary | ICD-10-CM | POA: Insufficient documentation

## 2016-08-19 ENCOUNTER — Ambulatory Visit
Admission: RE | Admit: 2016-08-19 | Discharge: 2016-08-19 | Disposition: A | Discharge status: Home / Self Care | Payer: Medicare Other | Source: Ambulatory Visit | Attending: Nurse Practitioner | Admitting: Nurse Practitioner

## 2016-08-19 DIAGNOSIS — M810 Age-related osteoporosis without current pathological fracture: Secondary | ICD-10-CM

## 2016-10-29 ENCOUNTER — Encounter (HOSPITAL_COMMUNITY): Payer: Self-pay | Admitting: Emergency Medicine

## 2016-10-29 ENCOUNTER — Emergency Department (HOSPITAL_COMMUNITY): Payer: Medicare Other

## 2016-10-29 ENCOUNTER — Emergency Department (HOSPITAL_COMMUNITY)
Admission: EM | Admit: 2016-10-29 | Discharge: 2016-10-29 | Disposition: A | Discharge status: Home / Self Care | Payer: Medicare Other | Attending: Emergency Medicine | Admitting: Emergency Medicine

## 2016-10-29 DIAGNOSIS — I10 Essential (primary) hypertension: Secondary | ICD-10-CM | POA: Diagnosis not present

## 2016-10-29 DIAGNOSIS — Y939 Activity, unspecified: Secondary | ICD-10-CM | POA: Insufficient documentation

## 2016-10-29 DIAGNOSIS — Y999 Unspecified external cause status: Secondary | ICD-10-CM | POA: Insufficient documentation

## 2016-10-29 DIAGNOSIS — E039 Hypothyroidism, unspecified: Secondary | ICD-10-CM | POA: Insufficient documentation

## 2016-10-29 DIAGNOSIS — Z7901 Long term (current) use of anticoagulants: Secondary | ICD-10-CM | POA: Diagnosis not present

## 2016-10-29 DIAGNOSIS — S8392XA Sprain of unspecified site of left knee, initial encounter: Secondary | ICD-10-CM | POA: Diagnosis not present

## 2016-10-29 DIAGNOSIS — Y929 Unspecified place or not applicable: Secondary | ICD-10-CM | POA: Diagnosis not present

## 2016-10-29 DIAGNOSIS — W1839XA Other fall on same level, initial encounter: Secondary | ICD-10-CM | POA: Insufficient documentation

## 2016-10-29 DIAGNOSIS — Z87891 Personal history of nicotine dependence: Secondary | ICD-10-CM | POA: Diagnosis not present

## 2016-10-29 DIAGNOSIS — I2510 Atherosclerotic heart disease of native coronary artery without angina pectoris: Secondary | ICD-10-CM | POA: Insufficient documentation

## 2016-10-29 DIAGNOSIS — Z96641 Presence of right artificial hip joint: Secondary | ICD-10-CM | POA: Insufficient documentation

## 2016-10-29 DIAGNOSIS — Z96653 Presence of artificial knee joint, bilateral: Secondary | ICD-10-CM | POA: Diagnosis not present

## 2016-10-29 DIAGNOSIS — S8992XA Unspecified injury of left lower leg, initial encounter: Secondary | ICD-10-CM | POA: Diagnosis present

## 2016-10-29 MED ORDER — ACETAMINOPHEN 325 MG PO TABS
650.0000 mg | ORAL_TABLET | Freq: Once | ORAL | Status: AC
  Administered 2016-10-29: 650 mg via ORAL
  Filled 2016-10-29: qty 2

## 2016-10-29 NOTE — ED Notes (Signed)
Bed: WA01 Expected date:  Expected time:  Means of arrival:  Comments: fall 

## 2016-10-29 NOTE — ED Triage Notes (Signed)
Per EMS: Pt from home.  Hx of hip replacement 7 months ago.  Walks with a walker.  Mechanical fall onto RIGHT leg to carpeted area however, no pain to rt side.  More of a "slide down".  Lt leg has been aching the last couple of days.

## 2016-10-29 NOTE — ED Notes (Signed)
Patient ambulated with little assistance, with walker

## 2016-10-29 NOTE — ED Provider Notes (Signed)
[facility] DEPT Provider Note   CSN: [id number] Arrival date & time: 10/29/16  0840     History   Chief Complaint Chief Complaint  Patient presents with  . Fall  . Leg Pain    HPI [name] is a 71 y.o. male.  HPI  71 year old male presents with acute left knee pain. He states he's had 8 revisions on his right hip and has been having progressive left lateral thigh and left knee pain as he has gotten more more progressive in his physical therapy. This is been ongoing for a couple months. He usually walks with a walker and today was walking with a walker and his left leg gave out on him. He felt a pop in his knee. Has not noticed any knee swelling. The pain is actually significantly improved since arrival to the ER. He was able to ambulate with EMS. He was unable to get up on his own but once he was back on his feet he was able to walk again. No weakness or numbness. No injuries from the fall.  Past Medical History:  Diagnosis Date  . A-fib (HCC)    hx.- sinus rhythm now  . Acid reflux   . Acute blood loss anemia   . Anxiety   . Arthritis   . BPH (benign prostatic hyperplasia)   . CAD (coronary artery disease)   . Depression   . Dislocation of internal right hip prosthesis, sequela   . Environmental allergies   . Fracture of wrist 10/23/13   LEFT  . High cholesterol   . HLD (hyperlipidemia)   . Low testosterone, possible hypogonadism 10/01/2012  . Lumbago   . Metabolic bone disease   . Osteoporosis   . Plaque psoriasis   . Sleep apnea    cpap machine-settings 17  . Thrombocytopenia ([place])   . Thyroid disease    HX GRAVES DISEASE  . Transfusion history    s/p 12'13 hip surgery  . Unsteady gait   . Unsteady gait   . Vertebral compression fracture (HCC)    L1-wears brace    Patient Active Problem List   Diagnosis Date Noted  . Hyperlipidemia 03/22/2016  . Wound drainage right hip 11/15/2013  . Expected blood loss anemia 09/18/2013  . Hyponatremia  09/18/2013  . S/P right TH revision 09/17/2013  . Nonunion, fracture, Right tibia  07/03/2013  . UTI (urinary tract infection) 07/03/2013  . Pathological fracture due to osteoporosis with delayed healing 04/24/2013  . Osteoporosis with fracture 04/24/2013  . Rash and nonspecific skin eruption 03/08/2013  . Syncope 02/21/2013  . PAF (paroxysmal atrial fibrillation) ([place]) 02/21/2013  . Chronic anticoagulation 02/21/2013  . Fall 02/08/2013  . Physical deconditioning 02/08/2013  . Low back pain radiating to both legs 02/08/2013  . Compression fracture of L1 lumbar vertebra (HCC) 02/08/2013  . Right tibial fracture 02/05/2013  . Obesity, unspecified 02/05/2013  . Osteomyelitis of right wrist ([place]) 11/02/2012  . Anemia 10/06/2012  . HTN (hypertension) 10/03/2012  . Psoriasis 10/03/2012  . Dyslipidemia 10/03/2012  . Vitamin D deficiency 10/03/2012  . GERD (gastroesophageal reflux disease) 10/03/2012  . Hyperglycemia 10/03/2012  . Anxiety 10/03/2012  . Depression 10/03/2012  . CAD (coronary artery disease) 10/03/2012  . DJD (degenerative joint disease) 10/03/2012  . Chronic gingivitis 10/03/2012  . Low testosterone, possible hypogonadism 10/01/2012  . Normocytic anemia 09/30/2012  . Right femoral fracture ([place]) 09/29/2012  . Right forearm fracture 09/29/2012  . Atrial fibrillation ([place]) 09/29/2012  . Hypothyroidism  09/29/2012  . History of recurrent UTIs 09/29/2012    Past Surgical History:  Procedure Laterality Date  . APPENDECTOMY    . CARDIAC CATHETERIZATION  2013  . HARDWARE REMOVAL  10/05/2012   Procedure: HARDWARE REMOVAL;  Surgeon: [name], MD;  Location: [facility];  Service: Orthopedics;  Laterality: Right;  REMOVING  STRYKER  GAMMA NAIL  . HARDWARE REMOVAL Right 07/03/2013   Procedure: HARDWARE REMOVAL RIGHT TIBIA ;  Surgeon: Rozanna Box, MD;  Location: [facility];  Service: Orthopedics;  Laterality: Right;  . HIP CLOSED REDUCTION Right 10/15/2013   Procedure: CLOSED  MANIPULATION HIP;  Surgeon: [name], MD;  Location: [facility];  Service: Orthopedics;  Laterality: Right;  . INCISION AND DRAINAGE HIP Right 11/16/2013   Procedure: IRRIGATION AND DEBRIDEMENT RIGHT HIP;  Surgeon: [name], MD;  Location: [facility];  Service: Orthopedics;  Laterality: Right;  . JOINT REPLACEMENT     hip-right x2  . KNEE SURGERY Bilateral 2012   Total knee replacements  . ORIF TIBIA FRACTURE Right 02/06/2013   Procedure: OPEN REDUCTION INTERNAL FIXATION (ORIF) TIBIA FRACTURE WITH IM ROD FIBULA;  Surgeon: Rozanna Box, MD;  Location: [facility];  Service: Orthopedics;  Laterality: Right;  . ORIF TIBIA FRACTURE Right 07/03/2013   Procedure: RIGHT TIBIA NON UNION REPAIR ;  Surgeon: Rozanna Box, MD;  Location: [facility];  Service: Orthopedics;  Laterality: Right;  . ORIF WRIST FRACTURE  10/02/2012   Procedure: OPEN REDUCTION INTERNAL FIXATION (ORIF) WRIST FRACTURE;  Surgeon: Roseanne Kaufman, MD;  Location: [facility];  Service: Orthopedics;  Laterality: Right;  WITH   ANTIBIOTIC  CEMENT  . ORIF WRIST FRACTURE Left 10/28/2013   Procedure: OPEN REDUCTION INTERNAL FIXATION (ORIF) WRIST FRACTURE with allograft;  Surgeon: Roseanne Kaufman, MD;  Location: [facility];  Service: Orthopedics;  Laterality: Left;  DVR Plate  . right femur surgery  05/2012  . TONSILLECTOMY    . TOTAL HIP REVISION  10/05/2012   Procedure: TOTAL HIP REVISION;  Surgeon: [name], MD;  Location: [facility];  Service: Orthopedics;  Laterality: Right;  RIGHT TOTAL HIP REVISION  . TOTAL HIP REVISION Right 09/17/2013   Procedure: REVISION RIGHT TOTAL HIP ARTHROPLASTY ;  Surgeon: [name], MD;  Location: [facility];  Service: Orthopedics;  Laterality: Right;  . TOTAL HIP REVISION Right 10/26/2013   Procedure: REVISION RIGHT TOTAL HIP ARTHROPLASTY;  Surgeon: [name], MD;  Location: [facility];  Service: Orthopedics;  Laterality: Right;  . WRIST FRACTURE SURGERY  05/2012       Home Medications    Prior to Admission  medications   Medication Sig Start Date End Date Taking? Authorizing Provider  amiodarone (PACERONE) 200 MG tablet Take 200 mg by mouth daily.   Yes Historical Provider, MD  amoxicillin (AMOXIL) 500 MG capsule Take 2,000 mg by mouth See admin instructions. Takes 1 hour before going to dentist appointment   Yes Historical Provider, MD  Ascorbic Acid (VITAMIN C) 1000 MG tablet Take 1,000 mg by mouth daily.   Yes Historical Provider, MD  atorvastatin (LIPITOR) 80 MG tablet Take 80 mg by mouth every evening.    Yes Historical Provider, MD  buPROPion (WELLBUTRIN XL) 300 MG 24 hr tablet Take 300 mg by mouth daily with breakfast.    Yes Historical Provider, MD  CALCIUM PO Take 400 mg by mouth 2 (two) times daily.   Yes Historical Provider, MD  Cholecalciferol 4000 units CAPS Take 4,000 Units  by mouth daily.   Yes Historical Provider, MD  clonazePAM (KLONOPIN) 1 MG tablet Take 1 mg by mouth 4 (four) times daily.    Yes Historical Provider, MD  denosumab (PROLIA) 60 MG/ML SOLN injection Inject 60 mg into the skin every 6 (six) months. Administer in upper arm, thigh, or abdomen   Yes Historical Provider, MD  diltiazem (CARDIZEM CD) 240 MG 24 hr capsule Take 240 mg by mouth every morning.  05/19/13  Yes Historical Provider, MD  diphenhydramine-acetaminophen (TYLENOL PM) 25-500 MG TABS tablet Take 2 tablets by mouth at bedtime.    Yes Historical Provider, MD  doxycycline (VIBRA-TABS) 100 MG tablet Take 100 mg by mouth daily.   Yes Historical Provider, MD  DULoxetine (CYMBALTA) 60 MG capsule Take 60 mg by mouth 2 (two) times daily.    Yes Historical Provider, MD  etanercept (ENBREL) 50 MG/ML injection Inject 50 mg into the skin 2 (two) times a week. Takes on Tuesday's and Friday's   Yes Historical Provider, MD  ferrous sulfate 325 (65 FE) MG tablet Take 325 mg by mouth daily.    Yes Historical Provider, MD  HYDROcodone-acetaminophen (NORCO) 7.5-325 MG tablet Take 1 tablet by mouth every 6 (six) hours.   Yes  Historical Provider, MD  levothyroxine (SYNTHROID, LEVOTHROID) 200 MCG tablet Take 200 mcg by mouth daily before breakfast.    Yes Historical Provider, MD  loratadine (CLARITIN) 10 MG tablet Take 10 mg by mouth daily.    Yes Historical Provider, MD  Multiple Vitamin (MULTIVITAMIN WITH MINERALS) TABS tablet Take 1 tablet by mouth daily.   Yes Historical Provider, MD  nitroGLYCERIN (NITROSTAT) 0.4 MG SL tablet Place 0.4 mg under the tongue every 5 (five) minutes as needed for chest pain. x3 doses as needed for chest pain   Yes Historical Provider, MD  omeprazole (PRILOSEC) 40 MG capsule Take 40 mg by mouth daily.   Yes Historical Provider, MD  polyethylene glycol (MIRALAX / GLYCOLAX) packet Take 17 g by mouth daily as needed for mild constipation.    Yes Historical Provider, MD  Probiotic Product (PROBIOTIC DAILY) CAPS Take 1 capsule by mouth daily with breakfast.   Yes Historical Provider, MD  ranolazine (RANEXA) 500 MG 12 hr tablet Take 500 mg by mouth 2 (two) times daily.    Yes Historical Provider, MD  rifampin (RIFADIN) 300 MG capsule Take 300 mg by mouth 2 (two) times daily. 09/13/16  Yes Historical Provider, MD  Rivaroxaban (XARELTO) 20 MG TABS Take 20 mg by mouth daily with supper.    Yes Historical Provider, MD  Tamsulosin HCl (FLOMAX) 0.4 MG CAPS Take 0.4 mg by mouth every evening.    Yes Historical Provider, MD  testosterone cypionate (DEPOTESTOSTERONE CYPIONATE) 200 MG/ML injection Inject 200mg  (74ml) intramuscularly every 14 days 03/30/16  Yes Lauree Chandler, NP  tizanidine (ZANAFLEX) 6 MG capsule Take 6 mg by mouth every 6 (six) hours.    Yes Historical Provider, MD    Family History Family History  Problem Relation Age of Onset  . CAD Father 47  . Arthritis Mother     Social History Social History  Substance Use Topics  . Smoking status: Former Smoker    Quit date: 06/05/2012  . Smokeless tobacco: Never Used  . Alcohol use Yes     Comment: occasional-social      Allergies   Patient has no known allergies.   Review of Systems Review of Systems  Musculoskeletal: Positive for arthralgias. Negative for joint swelling.  Neurological: Negative for weakness and numbness.  All other systems reviewed and are negative.    Physical Exam Updated Vital Signs BP 146/78 (BP Location: Right Arm)   Pulse 90   Temp 98.2 F (36.8 C) (Oral)   Resp 16   SpO2 99%   Physical Exam  Constitutional: He is oriented to person, place, and time. He appears well-developed and well-nourished.  HENT:  Head: Normocephalic and atraumatic.  Right Ear: External ear normal.  Left Ear: External ear normal.  Nose: Nose normal.  Eyes: Right eye exhibits no discharge. Left eye exhibits no discharge.  Neck: Neck supple.  Cardiovascular: Normal rate and regular rhythm.   Pulses:      Dorsalis pedis pulses are 2+ on the right side, and 2+ on the left side.  Pulmonary/Chest: Effort normal.  Musculoskeletal: He exhibits no edema.       Left hip: He exhibits normal range of motion and no tenderness.       Left knee: He exhibits normal range of motion, no erythema and normal alignment. Tenderness found. Medial joint line and lateral joint line tenderness noted.       Left upper leg: He exhibits no tenderness and no bony tenderness.       Left lower leg: He exhibits no tenderness.  Neurological: He is alert and oriented to person, place, and time.  Skin: Skin is warm and dry.  Nursing note and vitals reviewed.    ED Treatments / Results  Labs (all labs ordered are listed, but only abnormal results are displayed) Labs Reviewed - No data to display  EKG  EKG Interpretation None       Radiology Dg Knee Complete 4 Views Left  Result Date: 10/29/2016 CLINICAL DATA:  Fall. EXAM: LEFT KNEE - COMPLETE 4+ VIEW COMPARISON:  No recent prior. FINDINGS: Total left knee replacement. A fracture of the superior aspect of the posterior portion of the patella and patellar  prosthesis cannot be excluded. Femoral and tibial prostheses are intact and in good anatomic position. No other focal bony abnormality identified. Loose bodies may be present. Knee joint effusion cannot be excluded. Peripheral vascular calcification. IMPRESSION: Total left knee replacement. A fracture of the superior aspect of the posterior portion of the patella and patellar prostheses cannot be excluded. Electronically Signed   By: Marcello Moores  Register   On: 10/29/2016 10:43    Procedures Procedures (including critical care time)  Medications Ordered in ED Medications  acetaminophen (TYLENOL) tablet 650 mg (650 mg Oral Given 10/29/16 0947)     Initial Impression / Assessment and Plan / ED Course  I have reviewed the triage vital signs and the nursing notes.  Pertinent labs & imaging results that were available during my care of the patient were reviewed by me and considered in my medical decision making (see chart for details).  Clinical Course as of Oct 30 1927  Fri Oct 29, 2016  [id number] Patient is NV intact. Will xray left knee. No laxity on exam. Full ROM but is painful to bilateral lateral aspects of knee. No hip pain.   [SG]    Clinical Course User Index [SG] Sherwood Gambler, MD    Xray shows good alignment but possible chip fracture on patella. Will place in knee immobilizer. No effusion. He is able to ambulate with a walker which is baseline. Pain controlled. D/c home to f/u with his orthopedist.  Final Clinical Impressions(s) / ED Diagnoses   Final diagnoses:  Sprain of left knee,  unspecified ligament, initial encounter    New Prescriptions Discharge Medication List as of 10/29/2016 12:06 PM       Sherwood Gambler, MD 10/29/16 1930

## 2016-10-30 ENCOUNTER — Encounter (HOSPITAL_COMMUNITY): Payer: Self-pay | Admitting: Emergency Medicine

## 2016-10-30 ENCOUNTER — Emergency Department (HOSPITAL_COMMUNITY)
Admission: EM | Admit: 2016-10-30 | Discharge: 2016-10-30 | Disposition: A | Discharge status: Home / Self Care | Payer: Medicare Other | Attending: Emergency Medicine | Admitting: Emergency Medicine

## 2016-10-30 DIAGNOSIS — Z87891 Personal history of nicotine dependence: Secondary | ICD-10-CM | POA: Insufficient documentation

## 2016-10-30 DIAGNOSIS — Y929 Unspecified place or not applicable: Secondary | ICD-10-CM | POA: Insufficient documentation

## 2016-10-30 DIAGNOSIS — W19XXXA Unspecified fall, initial encounter: Secondary | ICD-10-CM

## 2016-10-30 DIAGNOSIS — E039 Hypothyroidism, unspecified: Secondary | ICD-10-CM | POA: Insufficient documentation

## 2016-10-30 DIAGNOSIS — I2510 Atherosclerotic heart disease of native coronary artery without angina pectoris: Secondary | ICD-10-CM | POA: Diagnosis not present

## 2016-10-30 DIAGNOSIS — G8929 Other chronic pain: Secondary | ICD-10-CM | POA: Insufficient documentation

## 2016-10-30 DIAGNOSIS — Z79899 Other long term (current) drug therapy: Secondary | ICD-10-CM | POA: Diagnosis not present

## 2016-10-30 DIAGNOSIS — W1839XA Other fall on same level, initial encounter: Secondary | ICD-10-CM | POA: Insufficient documentation

## 2016-10-30 DIAGNOSIS — Z7902 Long term (current) use of antithrombotics/antiplatelets: Secondary | ICD-10-CM | POA: Insufficient documentation

## 2016-10-30 DIAGNOSIS — Z96641 Presence of right artificial hip joint: Secondary | ICD-10-CM | POA: Diagnosis not present

## 2016-10-30 DIAGNOSIS — Y939 Activity, unspecified: Secondary | ICD-10-CM | POA: Diagnosis not present

## 2016-10-30 DIAGNOSIS — S50311A Abrasion of right elbow, initial encounter: Secondary | ICD-10-CM | POA: Insufficient documentation

## 2016-10-30 DIAGNOSIS — Y999 Unspecified external cause status: Secondary | ICD-10-CM | POA: Diagnosis not present

## 2016-10-30 DIAGNOSIS — I10 Essential (primary) hypertension: Secondary | ICD-10-CM | POA: Insufficient documentation

## 2016-10-30 DIAGNOSIS — M25562 Pain in left knee: Secondary | ICD-10-CM | POA: Insufficient documentation

## 2016-10-30 NOTE — Care Management Note (Addendum)
Case Management Note  Patient Details  Name: [name] MRN: [id number] Date of Birth: 1946-02-20  Subjective/Objective:    71 y.o. M seen in the [facility] with 2 falls in as many days. Lives in [place] with Husband who also suffers from "back and neck" problems and is unable to assist pt. Hs 8 Hip surgeries which he feels is associated with recent falls. Next Follow up with Ortho MD February 2018. Has used [facility] in the past and would like to use them again for HHPT to gain strength.  Has needed DME.  Notified [name] with [facility] of referral. Confirmed address, home/mobile number of chart as correct.             Action/Plan:  Anticipate discharge with HHPT. No further CM needs but will be available should additional discharge needs arise.   Expected Discharge Date:                  Expected Discharge Plan:  [place]  In-House Referral:  NA  Discharge planning Services  CM Consult  Post Acute Care Choice:  Home Health Choice offered to:  Patient  DME Arranged:   (has RW) DME Agency:  NA  HH Arranged:  PT [place] Agency:  [facility]  Status of Service:  Completed, signed off  If discussed at [place] of Stay Meetings, dates discussed:    Additional Comments:  Joseph Sawyers, RN 10/30/2016, 10:39 AM

## 2016-10-30 NOTE — ED Notes (Signed)
Bed: WA02 Expected date: 10/30/16 Expected time: 7:48 AM Means of arrival: Ambulance Comments: Fall, elbow abrasion

## 2016-10-30 NOTE — Discharge Instructions (Signed)
Please read and follow all provided instructions.  Your diagnoses today include:  1. Chronic pain of left knee   2. Fall, initial encounter     Tests performed today include: Vital signs. See below for your results today.   Medications prescribed:  Take as prescribed   Home care instructions:  Follow any educational materials contained in this packet.  Follow-up instructions: Please follow-up with your orthopedic provider for further evaluation of symptoms and treatment   Return instructions:  Please return to the Emergency Department if you do not get better, if you get worse, or new symptoms OR  - Fever (temperature greater than 101.31F)  - Bleeding that does not stop with holding pressure to the area    -Severe pain (please note that you may be more sore the day after your accident)  - Chest Pain  - Difficulty breathing  - Severe nausea or vomiting  - Inability to tolerate food and liquids  - Passing out  - Skin becoming red around your wounds  - Change in mental status (confusion or lethargy)  - New numbness or weakness    Please return if you have any other emergent concerns.  Additional Information:  Your vital signs today were: BP 127/59 (BP Location: Left Arm)    Pulse 91    Temp 98.2 F (36.8 C) (Oral)    Resp 19    Ht 6\' 3"  (1.905 m)    Wt 136.1 kg    SpO2 92%    BMI 37.50 kg/m  If your blood pressure (BP) was elevated above 135/85 this visit, please have this repeated by your doctor within one month. ---------------

## 2016-10-30 NOTE — ED Provider Notes (Signed)
[facility] DEPT Provider Note   CSN: [id number] Arrival date & time: 10/30/16  0755     History   Chief Complaint Chief Complaint  Patient presents with  . Fall    HPI [name] is a 71 y.o. male.  HPI  71 y.o. male with a hx of AFib on Xarelto, presents to the Emergency Department today via EMS due to mechanical fall this morning. Pt was attempting to sit down on the toilet when he felt his knee give out and landed on right elbow. No head trauma or LOC. Pt does endorse history of chronic knee pain. States pain mainly in right elbow with small abrasion noted. Pt does use Xarelto for Afib. No CP/SOB/ABD pain. No N/V. No vision changes. Does not endorse any pain currently. No other symptoms noted. Per EMS, pt spouse wants pt placed in SNF or other type of rehabilitation for the knee due to falls and is why patient presents here today after being seen yesterday for same.    Past Medical History:  Diagnosis Date  . A-fib (HCC)    hx.- sinus rhythm now  . Acid reflux   . Acute blood loss anemia   . Anxiety   . Arthritis   . BPH (benign prostatic hyperplasia)   . CAD (coronary artery disease)   . Depression   . Dislocation of internal right hip prosthesis, sequela   . Environmental allergies   . Fracture of wrist 10/23/13   LEFT  . High cholesterol   . HLD (hyperlipidemia)   . Low testosterone, possible hypogonadism 10/01/2012  . Lumbago   . Metabolic bone disease   . Osteoporosis   . Plaque psoriasis   . Sleep apnea    cpap machine-settings 17  . Thrombocytopenia ([place])   . Thyroid disease    HX GRAVES DISEASE  . Transfusion history    s/p 12'13 hip surgery  . Unsteady gait   . Unsteady gait   . Vertebral compression fracture (HCC)    L1-wears brace    Patient Active Problem List   Diagnosis Date Noted  . Hyperlipidemia 03/22/2016  . Wound drainage right hip 11/15/2013  . Expected blood loss anemia 09/18/2013  . Hyponatremia 09/18/2013  . S/P right TH  revision 09/17/2013  . Nonunion, fracture, Right tibia  07/03/2013  . UTI (urinary tract infection) 07/03/2013  . Pathological fracture due to osteoporosis with delayed healing 04/24/2013  . Osteoporosis with fracture 04/24/2013  . Rash and nonspecific skin eruption 03/08/2013  . Syncope 02/21/2013  . PAF (paroxysmal atrial fibrillation) ([place]) 02/21/2013  . Chronic anticoagulation 02/21/2013  . Fall 02/08/2013  . Physical deconditioning 02/08/2013  . Low back pain radiating to both legs 02/08/2013  . Compression fracture of L1 lumbar vertebra (HCC) 02/08/2013  . Right tibial fracture 02/05/2013  . Obesity, unspecified 02/05/2013  . Osteomyelitis of right wrist ([place]) 11/02/2012  . Anemia 10/06/2012  . HTN (hypertension) 10/03/2012  . Psoriasis 10/03/2012  . Dyslipidemia 10/03/2012  . Vitamin D deficiency 10/03/2012  . GERD (gastroesophageal reflux disease) 10/03/2012  . Hyperglycemia 10/03/2012  . Anxiety 10/03/2012  . Depression 10/03/2012  . CAD (coronary artery disease) 10/03/2012  . DJD (degenerative joint disease) 10/03/2012  . Chronic gingivitis 10/03/2012  . Low testosterone, possible hypogonadism 10/01/2012  . Normocytic anemia 09/30/2012  . Right femoral fracture ([place]) 09/29/2012  . Right forearm fracture 09/29/2012  . Atrial fibrillation ([place]) 09/29/2012  . Hypothyroidism 09/29/2012  . History of recurrent UTIs 09/29/2012  Past Surgical History:  Procedure Laterality Date  . APPENDECTOMY    . CARDIAC CATHETERIZATION  2013  . HARDWARE REMOVAL  10/05/2012   Procedure: HARDWARE REMOVAL;  Surgeon: [name], MD;  Location: [facility];  Service: Orthopedics;  Laterality: Right;  REMOVING  STRYKER  GAMMA NAIL  . HARDWARE REMOVAL Right 07/03/2013   Procedure: HARDWARE REMOVAL RIGHT TIBIA ;  Surgeon: Rozanna Box, MD;  Location: [facility];  Service: Orthopedics;  Laterality: Right;  . HIP CLOSED REDUCTION Right 10/15/2013   Procedure: CLOSED MANIPULATION HIP;  Surgeon:  [name], MD;  Location: [facility];  Service: Orthopedics;  Laterality: Right;  . INCISION AND DRAINAGE HIP Right 11/16/2013   Procedure: IRRIGATION AND DEBRIDEMENT RIGHT HIP;  Surgeon: [name], MD;  Location: [facility];  Service: Orthopedics;  Laterality: Right;  . JOINT REPLACEMENT     hip-right x2  . KNEE SURGERY Bilateral 2012   Total knee replacements  . ORIF TIBIA FRACTURE Right 02/06/2013   Procedure: OPEN REDUCTION INTERNAL FIXATION (ORIF) TIBIA FRACTURE WITH IM ROD FIBULA;  Surgeon: Rozanna Box, MD;  Location: [facility];  Service: Orthopedics;  Laterality: Right;  . ORIF TIBIA FRACTURE Right 07/03/2013   Procedure: RIGHT TIBIA NON UNION REPAIR ;  Surgeon: Rozanna Box, MD;  Location: [facility];  Service: Orthopedics;  Laterality: Right;  . ORIF WRIST FRACTURE  10/02/2012   Procedure: OPEN REDUCTION INTERNAL FIXATION (ORIF) WRIST FRACTURE;  Surgeon: Roseanne Kaufman, MD;  Location: [facility];  Service: Orthopedics;  Laterality: Right;  WITH   ANTIBIOTIC  CEMENT  . ORIF WRIST FRACTURE Left 10/28/2013   Procedure: OPEN REDUCTION INTERNAL FIXATION (ORIF) WRIST FRACTURE with allograft;  Surgeon: Roseanne Kaufman, MD;  Location: [facility];  Service: Orthopedics;  Laterality: Left;  DVR Plate  . right femur surgery  05/2012  . TONSILLECTOMY    . TOTAL HIP REVISION  10/05/2012   Procedure: TOTAL HIP REVISION;  Surgeon: [name], MD;  Location: [facility];  Service: Orthopedics;  Laterality: Right;  RIGHT TOTAL HIP REVISION  . TOTAL HIP REVISION Right 09/17/2013   Procedure: REVISION RIGHT TOTAL HIP ARTHROPLASTY ;  Surgeon: [name], MD;  Location: [facility];  Service: Orthopedics;  Laterality: Right;  . TOTAL HIP REVISION Right 10/26/2013   Procedure: REVISION RIGHT TOTAL HIP ARTHROPLASTY;  Surgeon: [name], MD;  Location: [facility];  Service: Orthopedics;  Laterality: Right;  . WRIST FRACTURE SURGERY  05/2012       Home Medications    Prior to Admission medications   Medication Sig  Start Date End Date Taking? Authorizing Provider  amiodarone (PACERONE) 200 MG tablet Take 200 mg by mouth daily.    Historical Provider, MD  amoxicillin (AMOXIL) 500 MG capsule Take 2,000 mg by mouth See admin instructions. Takes 1 hour before going to dentist appointment    Historical Provider, MD  Ascorbic Acid (VITAMIN C) 1000 MG tablet Take 1,000 mg by mouth daily.    Historical Provider, MD  atorvastatin (LIPITOR) 80 MG tablet Take 80 mg by mouth every evening.     Historical Provider, MD  buPROPion (WELLBUTRIN XL) 300 MG 24 hr tablet Take 300 mg by mouth daily with breakfast.     Historical Provider, MD  CALCIUM PO Take 400 mg by mouth 2 (two) times daily.    Historical Provider, MD  Cholecalciferol 4000 units CAPS Take 4,000 Units by mouth daily.    Historical Provider, MD  clonazePAM (  KLONOPIN) 1 MG tablet Take 1 mg by mouth 4 (four) times daily.     Historical Provider, MD  denosumab (PROLIA) 60 MG/ML SOLN injection Inject 60 mg into the skin every 6 (six) months. Administer in upper arm, thigh, or abdomen    Historical Provider, MD  diltiazem (CARDIZEM CD) 240 MG 24 hr capsule Take 240 mg by mouth every morning.  05/19/13   Historical Provider, MD  diphenhydramine-acetaminophen (TYLENOL PM) 25-500 MG TABS tablet Take 2 tablets by mouth at bedtime.     Historical Provider, MD  doxycycline (VIBRA-TABS) 100 MG tablet Take 100 mg by mouth daily.    Historical Provider, MD  DULoxetine (CYMBALTA) 60 MG capsule Take 60 mg by mouth 2 (two) times daily.     Historical Provider, MD  etanercept (ENBREL) 50 MG/ML injection Inject 50 mg into the skin 2 (two) times a week. Takes on Tuesday's and Friday's    Historical Provider, MD  ferrous sulfate 325 (65 FE) MG tablet Take 325 mg by mouth daily.     Historical Provider, MD  HYDROcodone-acetaminophen (NORCO) 7.5-325 MG tablet Take 1 tablet by mouth every 6 (six) hours.    Historical Provider, MD  levothyroxine (SYNTHROID, LEVOTHROID) 200 MCG tablet Take  200 mcg by mouth daily before breakfast.     Historical Provider, MD  loratadine (CLARITIN) 10 MG tablet Take 10 mg by mouth daily.     Historical Provider, MD  Multiple Vitamin (MULTIVITAMIN WITH MINERALS) TABS tablet Take 1 tablet by mouth daily.    Historical Provider, MD  nitroGLYCERIN (NITROSTAT) 0.4 MG SL tablet Place 0.4 mg under the tongue every 5 (five) minutes as needed for chest pain. x3 doses as needed for chest pain    Historical Provider, MD  omeprazole (PRILOSEC) 40 MG capsule Take 40 mg by mouth daily.    Historical Provider, MD  polyethylene glycol (MIRALAX / GLYCOLAX) packet Take 17 g by mouth daily as needed for mild constipation.     Historical Provider, MD  Probiotic Product (PROBIOTIC DAILY) CAPS Take 1 capsule by mouth daily with breakfast.    Historical Provider, MD  ranolazine (RANEXA) 500 MG 12 hr tablet Take 500 mg by mouth 2 (two) times daily.     Historical Provider, MD  rifampin (RIFADIN) 300 MG capsule Take 300 mg by mouth 2 (two) times daily. 09/13/16   Historical Provider, MD  Rivaroxaban (XARELTO) 20 MG TABS Take 20 mg by mouth daily with supper.     Historical Provider, MD  Tamsulosin HCl (FLOMAX) 0.4 MG CAPS Take 0.4 mg by mouth every evening.     Historical Provider, MD  testosterone cypionate (DEPOTESTOSTERONE CYPIONATE) 200 MG/ML injection Inject 200mg  (46ml) intramuscularly every 14 days 03/30/16   Lauree Chandler, NP  tizanidine (ZANAFLEX) 6 MG capsule Take 6 mg by mouth every 6 (six) hours.     Historical Provider, MD    Family History Family History  Problem Relation Age of Onset  . CAD Father 28  . Arthritis Mother     Social History Social History  Substance Use Topics  . Smoking status: Former Smoker    Quit date: 06/05/2012  . Smokeless tobacco: Never Used  . Alcohol use Yes     Comment: occasional-social     Allergies   Patient has no known allergies.   Review of Systems Review of Systems ROS reviewed and all are negative for acute  change except as noted in the HPI.  Physical Exam Updated Vital  Signs BP 127/59 (BP Location: Left Arm)   Pulse 86   Temp 98.2 F (36.8 C) (Oral)   Resp 19   Ht 6\' 3"  (1.905 m)   Wt 136.1 kg   SpO2 96%   BMI 37.50 kg/m   Physical Exam  Constitutional: He is oriented to person, place, and time. Vital signs are normal. He appears well-developed and well-nourished.  HENT:  Head: Normocephalic and atraumatic.  Right Ear: Hearing normal.  Left Ear: Hearing normal.  Eyes: Conjunctivae and EOM are normal. Pupils are equal, round, and reactive to light.  Neck: Normal range of motion. Neck supple.  Cardiovascular: Normal rate, regular rhythm, normal heart sounds and intact distal pulses.   Pulmonary/Chest: Effort normal and breath sounds normal.  Abdominal: There is no tenderness.  Musculoskeletal: Normal range of motion.  Left Knee: Surgical scare noted for Total Knee. Negative ballottement test. No varus or valgus laxity. No crepitus. No pain with flexion or extension. No TTP of knees or ankles.  Right Elbow: ROM intact. NVI with distal pulses appreciated. Area non TTP. No erythema. No swelling. Mild abrasion noted with bleeding controlled.   Neurological: He is alert and oriented to person, place, and time.  Skin: Skin is warm and dry.  Psychiatric: He has a normal mood and affect. His speech is normal and behavior is normal. Thought content normal.  Nursing note and vitals reviewed.  ED Treatments / Results  Labs (all labs ordered are listed, but only abnormal results are displayed) Labs Reviewed - No data to display  EKG  EKG Interpretation None       Radiology Dg Knee Complete 4 Views Left  Result Date: 10/29/2016 CLINICAL DATA:  Fall. EXAM: LEFT KNEE - COMPLETE 4+ VIEW COMPARISON:  No recent prior. FINDINGS: Total left knee replacement. A fracture of the superior aspect of the posterior portion of the patella and patellar prosthesis cannot be excluded. Femoral and  tibial prostheses are intact and in good anatomic position. No other focal bony abnormality identified. Loose bodies may be present. Knee joint effusion cannot be excluded. Peripheral vascular calcification. IMPRESSION: Total left knee replacement. A fracture of the superior aspect of the posterior portion of the patella and patellar prostheses cannot be excluded. Electronically Signed   By: Marcello Moores  Register   On: 10/29/2016 10:43    Procedures Procedures (including critical care time)  Medications Ordered in ED Medications - No data to display   Initial Impression / Assessment and Plan / ED Course  I have reviewed the triage vital signs and the nursing notes.  Pertinent labs & imaging results that were available during my care of the patient were reviewed by me and considered in my medical decision making (see chart for details).    Final Clinical Impressions(s) / ED Diagnoses   {I have reviewed and evaluated the relevant imaging studies.  {I have reviewed the relevant previous healthcare records.  {I obtained HPI from historian.   ED Course:  Assessment: Pt is a 70yM with hx AFib on Xarelto who presents due to mechanical fall. Noted right elbow pain with abrasion. No head trauma or LOC. On exam, pt in NAD. Nontoxic/nonseptic appearing. VSS. Afebrile. Lungs CTA. Heart RRR. Abdomen nontender soft. Extremities with full ROM without pain. Reviewed visit yesterday. Consult to case management for possible home PT or Aid. Plan is to DC home with follow up to Ortho. At time of discharge, Patient is in no acute distress. Vital Signs are stable. Patient is able  to ambulate. Patient able to tolerate PO.   Disposition/Plan:  DC Home Additional Verbal discharge instructions given and discussed with patient.  Pt Instructed to f/u with Ortho in the next week for evaluation and treatment of symptoms. Return precautions given Pt acknowledges and agrees with plan  Supervising Physician Gwenyth Allegra  Tegeler, MD  Final diagnoses:  Chronic pain of left knee    New Prescriptions New Prescriptions   No medications on file     Shary Decamp, PA-C 10/30/16 [place], MD 10/30/16 1723

## 2016-10-30 NOTE — ED Notes (Signed)
Patient given graham crackers and a diet coke.

## 2016-10-30 NOTE — ED Triage Notes (Addendum)
Pt comes from home via EMS with complaints of a fall.  Pt was trying to sit down on the toilet when his knee gave out and he landed in the floor. Pt denies LOC or hitting his head.  Only injury is small abrasion to right elbow.  Endorses Xarelto use.  Hx of chronic left knee pain and A-fib.  Takes hydrocodone for knee pain; last use 0200.

## 2016-10-30 NOTE — ED Notes (Signed)
Nurse secretary has attempted multiple times to reach case manager without success.

## 2016-11-02 ENCOUNTER — Emergency Department (HOSPITAL_COMMUNITY): Payer: Medicare Other

## 2016-11-02 ENCOUNTER — Observation Stay (HOSPITAL_COMMUNITY)
Admission: EM | Admit: 2016-11-02 | Discharge: 2016-11-05 | Disposition: A | Discharge status: Home / Self Care | Payer: Medicare Other | Attending: Internal Medicine | Admitting: Internal Medicine

## 2016-11-02 ENCOUNTER — Encounter (HOSPITAL_COMMUNITY): Payer: Self-pay | Admitting: Emergency Medicine

## 2016-11-02 DIAGNOSIS — Z79891 Long term (current) use of opiate analgesic: Secondary | ICD-10-CM | POA: Diagnosis not present

## 2016-11-02 DIAGNOSIS — W19XXXA Unspecified fall, initial encounter: Secondary | ICD-10-CM | POA: Diagnosis not present

## 2016-11-02 DIAGNOSIS — I480 Paroxysmal atrial fibrillation: Secondary | ICD-10-CM | POA: Diagnosis not present

## 2016-11-02 DIAGNOSIS — Z87891 Personal history of nicotine dependence: Secondary | ICD-10-CM | POA: Diagnosis not present

## 2016-11-02 DIAGNOSIS — I10 Essential (primary) hypertension: Secondary | ICD-10-CM | POA: Diagnosis present

## 2016-11-02 DIAGNOSIS — I2510 Atherosclerotic heart disease of native coronary artery without angina pectoris: Secondary | ICD-10-CM | POA: Diagnosis not present

## 2016-11-02 DIAGNOSIS — F32A Depression, unspecified: Secondary | ICD-10-CM | POA: Diagnosis present

## 2016-11-02 DIAGNOSIS — L409 Psoriasis, unspecified: Secondary | ICD-10-CM | POA: Diagnosis not present

## 2016-11-02 DIAGNOSIS — Z96653 Presence of artificial knee joint, bilateral: Secondary | ICD-10-CM | POA: Diagnosis not present

## 2016-11-02 DIAGNOSIS — E039 Hypothyroidism, unspecified: Secondary | ICD-10-CM | POA: Diagnosis not present

## 2016-11-02 DIAGNOSIS — K219 Gastro-esophageal reflux disease without esophagitis: Secondary | ICD-10-CM | POA: Diagnosis present

## 2016-11-02 DIAGNOSIS — N390 Urinary tract infection, site not specified: Secondary | ICD-10-CM | POA: Diagnosis not present

## 2016-11-02 DIAGNOSIS — N179 Acute kidney failure, unspecified: Secondary | ICD-10-CM | POA: Diagnosis present

## 2016-11-02 DIAGNOSIS — N200 Calculus of kidney: Secondary | ICD-10-CM | POA: Insufficient documentation

## 2016-11-02 DIAGNOSIS — Z7901 Long term (current) use of anticoagulants: Secondary | ICD-10-CM

## 2016-11-02 DIAGNOSIS — G4730 Sleep apnea, unspecified: Secondary | ICD-10-CM | POA: Diagnosis not present

## 2016-11-02 DIAGNOSIS — E785 Hyperlipidemia, unspecified: Secondary | ICD-10-CM | POA: Insufficient documentation

## 2016-11-02 DIAGNOSIS — F329 Major depressive disorder, single episode, unspecified: Secondary | ICD-10-CM | POA: Insufficient documentation

## 2016-11-02 DIAGNOSIS — N401 Enlarged prostate with lower urinary tract symptoms: Secondary | ICD-10-CM | POA: Diagnosis not present

## 2016-11-02 DIAGNOSIS — Z9989 Dependence on other enabling machines and devices: Secondary | ICD-10-CM | POA: Insufficient documentation

## 2016-11-02 DIAGNOSIS — G9340 Encephalopathy, unspecified: Secondary | ICD-10-CM | POA: Diagnosis not present

## 2016-11-02 DIAGNOSIS — R600 Localized edema: Secondary | ICD-10-CM | POA: Insufficient documentation

## 2016-11-02 DIAGNOSIS — F419 Anxiety disorder, unspecified: Secondary | ICD-10-CM | POA: Diagnosis not present

## 2016-11-02 DIAGNOSIS — J449 Chronic obstructive pulmonary disease, unspecified: Secondary | ICD-10-CM | POA: Diagnosis not present

## 2016-11-02 DIAGNOSIS — Z96641 Presence of right artificial hip joint: Secondary | ICD-10-CM | POA: Insufficient documentation

## 2016-11-02 DIAGNOSIS — Z79899 Other long term (current) drug therapy: Secondary | ICD-10-CM | POA: Insufficient documentation

## 2016-11-02 DIAGNOSIS — R531 Weakness: Secondary | ICD-10-CM | POA: Diagnosis present

## 2016-11-02 DIAGNOSIS — R5381 Other malaise: Secondary | ICD-10-CM | POA: Diagnosis present

## 2016-11-02 DIAGNOSIS — R7989 Other specified abnormal findings of blood chemistry: Secondary | ICD-10-CM | POA: Diagnosis present

## 2016-11-02 DIAGNOSIS — R609 Edema, unspecified: Secondary | ICD-10-CM

## 2016-11-02 LAB — COMPREHENSIVE METABOLIC PANEL
ALBUMIN: 2.8 g/dL — AB (ref 3.5–5.0)
ALT: 40 U/L (ref 17–63)
AST: 45 U/L — AB (ref 15–41)
Alkaline Phosphatase: 86 U/L (ref 38–126)
Anion gap: 9 (ref 5–15)
BUN: 27 mg/dL — AB (ref 6–20)
CHLORIDE: 102 mmol/L (ref 101–111)
CO2: 25 mmol/L (ref 22–32)
Calcium: 8.3 mg/dL — ABNORMAL LOW (ref 8.9–10.3)
Creatinine, Ser: 1.21 mg/dL (ref 0.61–1.24)
GFR calc Af Amer: >60 mL/min (ref >60)
GFR, EST NON AFRICAN AMERICAN: 58 mL/min — AB (ref >60)
Glucose, Bld: 113 mg/dL — ABNORMAL HIGH (ref 65–99)
POTASSIUM: 3.7 mmol/L (ref 3.5–5.1)
SODIUM: 136 mmol/L (ref 135–145)
Total Bilirubin: 1.5 mg/dL — ABNORMAL HIGH (ref 0.3–1.2)
Total Protein: 7.2 g/dL (ref 6.5–8.1)

## 2016-11-02 LAB — URINALYSIS, ROUTINE W REFLEX MICROSCOPIC
BILIRUBIN URINE: NEGATIVE
Glucose, UA: NEGATIVE mg/dL
KETONES UR: NEGATIVE mg/dL
Nitrite: NEGATIVE
PH: 7 (ref 5.0–8.0)
Protein, ur: 100 mg/dL — AB
SQUAMOUS EPITHELIAL / LPF: NONE SEEN
Specific Gravity, Urine: 1.017 (ref 1.005–1.030)

## 2016-11-02 LAB — PROTIME-INR
INR: 2.45
Prothrombin Time: 27 seconds — ABNORMAL HIGH (ref 11.4–15.2)

## 2016-11-02 LAB — CBC WITH DIFFERENTIAL/PLATELET
BASOS ABS: 0 10*3/uL (ref 0.0–0.1)
BASOS PCT: 0 %
EOS ABS: 0 10*3/uL (ref 0.0–0.7)
EOS PCT: 0 %
HCT: 34.7 % — ABNORMAL LOW (ref 39.0–52.0)
Hemoglobin: 11.7 g/dL — ABNORMAL LOW (ref 13.0–17.0)
Lymphocytes Relative: 12 %
Lymphs Abs: 1.1 10*3/uL (ref 0.7–4.0)
MCH: 29.8 pg (ref 26.0–34.0)
MCHC: 33.7 g/dL (ref 30.0–36.0)
MCV: 88.3 fL (ref 78.0–100.0)
MONO ABS: 1 10*3/uL (ref 0.1–1.0)
Monocytes Relative: 11 %
Neutro Abs: 7.1 10*3/uL (ref 1.7–7.7)
Neutrophils Relative %: 77 %
PLATELETS: 257 10*3/uL (ref 150–400)
RBC: 3.93 MIL/uL — ABNORMAL LOW (ref 4.22–5.81)
RDW: 15.9 % — AB (ref 11.5–15.5)
WBC: 9.2 10*3/uL (ref 4.0–10.5)

## 2016-11-02 LAB — I-STAT TROPONIN, ED: Troponin i, poc: 0 ng/mL (ref 0.00–0.08)

## 2016-11-02 LAB — I-STAT CG4 LACTIC ACID, ED: LACTIC ACID, VENOUS: 1.02 mmol/L (ref 0.5–1.9)

## 2016-11-02 LAB — BRAIN NATRIURETIC PEPTIDE: B Natriuretic Peptide: 204.9 pg/mL — ABNORMAL HIGH (ref 0.0–100.0)

## 2016-11-02 MED ORDER — DEXTROSE 5 % IV SOLN
1.0000 g | Freq: Once | INTRAVENOUS | Status: AC
  Administered 2016-11-03: 1 g via INTRAVENOUS
  Filled 2016-11-02: qty 10

## 2016-11-02 MED ORDER — IPRATROPIUM-ALBUTEROL 0.5-2.5 (3) MG/3ML IN SOLN
3.0000 mL | Freq: Once | RESPIRATORY_TRACT | Status: AC
  Administered 2016-11-02: 3 mL via RESPIRATORY_TRACT
  Filled 2016-11-02: qty 3

## 2016-11-02 NOTE — ED Triage Notes (Signed)
Patient brought in from home by [facility]. Patient fell while trying to back up to sit in chair. Pt states he fell due to L knee pain that has increased since falling. Pt has been feeling progressively weak x2 weeks. Pt also fell last Friday and has noticed some blood in his urine since then. PCP aware of previous fall but pt was unable to get an appt. This week. Pt has had R hip replacement. Is on blood thinners for A-Fib. Denies LOC, n/ v. Pt is unable to ambulate at this time.

## 2016-11-02 NOTE — ED Notes (Signed)
Respiratory notified of duoneb.

## 2016-11-02 NOTE — ED Provider Notes (Signed)
[facility] DEPT Provider Note   CSN: [id number] Arrival date & time: 11/02/16  1931     History   Chief Complaint Chief Complaint  Patient presents with  . Fall    HPI [name] is a 71 y.o. male.  HPI   71 yo M with PMHx of AFIb, HLD, depression here with generalized weakness. Pt currently recovering from right hip replacement. He was doing well until last week, when he began to have recurrent falls 2/2 generalized weakness. Pt has also had increasing confusion, decreased energy. He has also noticed intermittent hematuria for several days, without flank pain. No fevers. Decreased appetite and urination. This is atypical for him and he has no h/o similar sx. During his falls, his legs just "give out" from weakness. Denies direct head injury or LOC.  Past Medical History:  Diagnosis Date  . A-fib (HCC)    hx.- sinus rhythm now  . Acid reflux   . Acute blood loss anemia   . Anxiety   . Arthritis   . BPH (benign prostatic hyperplasia)   . CAD (coronary artery disease)   . Depression   . Dislocation of internal right hip prosthesis, sequela   . Environmental allergies   . Fracture of wrist 10/23/13   LEFT  . High cholesterol   . HLD (hyperlipidemia)   . Low testosterone, possible hypogonadism 10/01/2012  . Lumbago   . Metabolic bone disease   . Osteoporosis   . Plaque psoriasis   . Sleep apnea    cpap machine-settings 17  . Thrombocytopenia ([place])   . Thyroid disease    HX GRAVES DISEASE  . Transfusion history    s/p 12'13 hip surgery  . Unsteady gait   . Unsteady gait   . Vertebral compression fracture (HCC)    L1-wears brace    Patient Active Problem List   Diagnosis Date Noted  . AKI (acute kidney injury) ([place]) 11/03/2016  . Hyperlipidemia 03/22/2016  . Wound drainage right hip 11/15/2013  . Expected blood loss anemia 09/18/2013  . Hyponatremia 09/18/2013  . S/P right TH revision 09/17/2013  . Nonunion, fracture, Right tibia  07/03/2013  . UTI  (urinary tract infection) 07/03/2013  . Pathological fracture due to osteoporosis with delayed healing 04/24/2013  . Osteoporosis with fracture 04/24/2013  . Rash and nonspecific skin eruption 03/08/2013  . Syncope 02/21/2013  . PAF (paroxysmal atrial fibrillation) ([place]) 02/21/2013  . Chronic anticoagulation 02/21/2013  . Fall 02/08/2013  . Physical deconditioning 02/08/2013  . Low back pain radiating to both legs 02/08/2013  . Compression fracture of L1 lumbar vertebra (HCC) 02/08/2013  . Right tibial fracture 02/05/2013  . Obesity, unspecified 02/05/2013  . Osteomyelitis of right wrist ([place]) 11/02/2012  . Anemia 10/06/2012  . HTN (hypertension) 10/03/2012  . Psoriasis 10/03/2012  . Dyslipidemia 10/03/2012  . Vitamin D deficiency 10/03/2012  . GERD (gastroesophageal reflux disease) 10/03/2012  . Hyperglycemia 10/03/2012  . Anxiety 10/03/2012  . Depression 10/03/2012  . CAD (coronary artery disease) 10/03/2012  . DJD (degenerative joint disease) 10/03/2012  . Chronic gingivitis 10/03/2012  . Low testosterone, possible hypogonadism 10/01/2012  . Normocytic anemia 09/30/2012  . Right femoral fracture ([place]) 09/29/2012  . Right forearm fracture 09/29/2012  . Atrial fibrillation ([place]) 09/29/2012  . Hypothyroidism 09/29/2012  . History of recurrent UTIs 09/29/2012    Past Surgical History:  Procedure Laterality Date  . APPENDECTOMY    . CARDIAC CATHETERIZATION  2013  . HARDWARE REMOVAL  10/05/2012  Procedure: HARDWARE REMOVAL;  Surgeon: [name], MD;  Location: [facility];  Service: Orthopedics;  Laterality: Right;  REMOVING  STRYKER  GAMMA NAIL  . HARDWARE REMOVAL Right 07/03/2013   Procedure: HARDWARE REMOVAL RIGHT TIBIA ;  Surgeon: Rozanna Box, MD;  Location: [facility];  Service: Orthopedics;  Laterality: Right;  . HIP CLOSED REDUCTION Right 10/15/2013   Procedure: CLOSED MANIPULATION HIP;  Surgeon: [name], MD;  Location: [facility];  Service: Orthopedics;  Laterality:  Right;  . INCISION AND DRAINAGE HIP Right 11/16/2013   Procedure: IRRIGATION AND DEBRIDEMENT RIGHT HIP;  Surgeon: [name], MD;  Location: [facility];  Service: Orthopedics;  Laterality: Right;  . JOINT REPLACEMENT     hip-right x2  . KNEE SURGERY Bilateral 2012   Total knee replacements  . ORIF TIBIA FRACTURE Right 02/06/2013   Procedure: OPEN REDUCTION INTERNAL FIXATION (ORIF) TIBIA FRACTURE WITH IM ROD FIBULA;  Surgeon: Rozanna Box, MD;  Location: [facility];  Service: Orthopedics;  Laterality: Right;  . ORIF TIBIA FRACTURE Right 07/03/2013   Procedure: RIGHT TIBIA NON UNION REPAIR ;  Surgeon: Rozanna Box, MD;  Location: [facility];  Service: Orthopedics;  Laterality: Right;  . ORIF WRIST FRACTURE  10/02/2012   Procedure: OPEN REDUCTION INTERNAL FIXATION (ORIF) WRIST FRACTURE;  Surgeon: Roseanne Kaufman, MD;  Location: [facility];  Service: Orthopedics;  Laterality: Right;  WITH   ANTIBIOTIC  CEMENT  . ORIF WRIST FRACTURE Left 10/28/2013   Procedure: OPEN REDUCTION INTERNAL FIXATION (ORIF) WRIST FRACTURE with allograft;  Surgeon: Roseanne Kaufman, MD;  Location: [facility];  Service: Orthopedics;  Laterality: Left;  DVR Plate  . right femur surgery  05/2012  . TONSILLECTOMY    . TOTAL HIP REVISION  10/05/2012   Procedure: TOTAL HIP REVISION;  Surgeon: [name], MD;  Location: [facility];  Service: Orthopedics;  Laterality: Right;  RIGHT TOTAL HIP REVISION  . TOTAL HIP REVISION Right 09/17/2013   Procedure: REVISION RIGHT TOTAL HIP ARTHROPLASTY ;  Surgeon: [name], MD;  Location: [facility];  Service: Orthopedics;  Laterality: Right;  . TOTAL HIP REVISION Right 10/26/2013   Procedure: REVISION RIGHT TOTAL HIP ARTHROPLASTY;  Surgeon: [name], MD;  Location: [facility];  Service: Orthopedics;  Laterality: Right;  . WRIST FRACTURE SURGERY  05/2012       Home Medications    Prior to Admission medications   Medication Sig Start Date End Date Taking? Authorizing Provider  amiodarone (PACERONE) 200 MG  tablet Take 200 mg by mouth daily.   Yes Historical Provider, MD  Ascorbic Acid (VITAMIN C) 1000 MG tablet Take 1,000 mg by mouth daily.   Yes Historical Provider, MD  atorvastatin (LIPITOR) 80 MG tablet Take 80 mg by mouth every evening.    Yes Historical Provider, MD  buPROPion (WELLBUTRIN XL) 300 MG 24 hr tablet Take 300 mg by mouth daily with breakfast.    Yes Historical Provider, MD  CALCIUM PO Take 400 mg by mouth 2 (two) times daily.   Yes Historical Provider, MD  Cholecalciferol 4000 units CAPS Take 4,000 Units by mouth daily.   Yes Historical Provider, MD  clonazePAM (KLONOPIN) 1 MG tablet Take 1 mg by mouth 4 (four) times daily.    Yes Historical Provider, MD  denosumab (PROLIA) 60 MG/ML SOLN injection Inject 60 mg into the skin every 6 (six) months. Administer in upper arm, thigh, or abdomen   Yes Historical Provider, MD  diltiazem (CARDIZEM CD)  240 MG 24 hr capsule Take 240 mg by mouth every morning.  05/19/13  Yes Historical Provider, MD  doxycycline (VIBRA-TABS) 100 MG tablet Take 100 mg by mouth daily.   Yes Historical Provider, MD  DULoxetine (CYMBALTA) 60 MG capsule Take 60 mg by mouth 2 (two) times daily.    Yes Historical Provider, MD  etanercept (ENBREL) 50 MG/ML injection Inject 50 mg into the skin 2 (two) times a week. Takes on Tuesday's and Friday's   Yes Historical Provider, MD  ferrous sulfate 325 (65 FE) MG tablet Take 325 mg by mouth daily.    Yes Historical Provider, MD  HYDROcodone-acetaminophen (NORCO) 7.5-325 MG tablet Take 1 tablet by mouth every 6 (six) hours.   Yes Historical Provider, MD  levothyroxine (SYNTHROID, LEVOTHROID) 200 MCG tablet Take 200 mcg by mouth daily before breakfast.    Yes Historical Provider, MD  loratadine (CLARITIN) 10 MG tablet Take 10 mg by mouth daily.    Yes Historical Provider, MD  Multiple Vitamin (MULTIVITAMIN WITH MINERALS) TABS tablet Take 1 tablet by mouth daily.   Yes Historical Provider, MD  nitroGLYCERIN (NITROSTAT) 0.4 MG SL  tablet Place 0.4 mg under the tongue every 5 (five) minutes as needed for chest pain. x3 doses as needed for chest pain   Yes Historical Provider, MD  omeprazole (PRILOSEC) 40 MG capsule Take 40 mg by mouth daily.   Yes Historical Provider, MD  polyethylene glycol (MIRALAX / GLYCOLAX) packet Take 17 g by mouth daily as needed for mild constipation.    Yes Historical Provider, MD  Probiotic Product (PROBIOTIC DAILY) CAPS Take 1 capsule by mouth daily with breakfast.   Yes Historical Provider, MD  ranolazine (RANEXA) 500 MG 12 hr tablet Take 500 mg by mouth 2 (two) times daily.    Yes Historical Provider, MD  rivaroxaban (XARELTO) 20 MG TABS tablet Take 20 mg by mouth daily with supper.    Yes Historical Provider, MD  Tamsulosin HCl (FLOMAX) 0.4 MG CAPS Take 0.4 mg by mouth every evening.    Yes Historical Provider, MD  testosterone cypionate (DEPOTESTOSTERONE CYPIONATE) 200 MG/ML injection Inject 200mg  (49ml) intramuscularly every 14 days 03/30/16  Yes Lauree Chandler, NP  tizanidine (ZANAFLEX) 6 MG capsule Take 6 mg by mouth every 6 (six) hours.    Yes Historical Provider, MD    Family History Family History  Problem Relation Age of Onset  . CAD Father 53  . Arthritis Mother     Social History Social History  Substance Use Topics  . Smoking status: Former Smoker    Quit date: 06/05/2012  . Smokeless tobacco: Never Used  . Alcohol use Yes     Comment: occasional-social     Allergies   Patient has no known allergies.   Review of Systems Review of Systems  Constitutional: Positive for chills and fatigue.  HENT: Negative for congestion and rhinorrhea.   Respiratory: Positive for cough.   Gastrointestinal: Positive for nausea. Negative for abdominal pain, diarrhea and vomiting.  Genitourinary: Positive for dysuria and hematuria.  Musculoskeletal: Positive for gait problem.  All other systems reviewed and are negative.    Physical Exam Updated Vital Signs BP 142/68 (BP  Location: Right Arm)   Pulse 100   Temp 98.7 F (37.1 C) (Oral)   Resp 20   SpO2 93%   Physical Exam  Constitutional: He is oriented to person, place, and time. He appears well-developed and well-nourished. No distress.  HENT:  Head: Normocephalic and atraumatic.  Mildly dry MM  Eyes: Conjunctivae are normal.  Neck: Neck supple.  Cardiovascular: Normal rate, regular rhythm and normal heart sounds.  Exam reveals no friction rub.   No murmur heard. Pulmonary/Chest: Effort normal and breath sounds normal. No respiratory distress. He has no wheezes. He has no rales.  Abdominal: Soft. Bowel sounds are normal. He exhibits no distension. There is no tenderness. There is no rebound and no guarding.  Musculoskeletal: He exhibits no edema.  Neurological: He is alert and oriented to person, place, and time. He exhibits normal muscle tone.  Skin: Skin is warm. Capillary refill takes less than 2 seconds.  Psychiatric: He has a normal mood and affect.  Nursing note and vitals reviewed.    ED Treatments / Results  Labs (all labs ordered are listed, but only abnormal results are displayed) Labs Reviewed  CBC WITH DIFFERENTIAL/PLATELET - Abnormal; Notable for the following:       Result Value   RBC 3.93 (*)    Hemoglobin 11.7 (*)    HCT 34.7 (*)    RDW 15.9 (*)    All other components within normal limits  COMPREHENSIVE METABOLIC PANEL - Abnormal; Notable for the following:    Glucose, Bld 113 (*)    BUN 27 (*)    Calcium 8.3 (*)    Albumin 2.8 (*)    AST 45 (*)    Total Bilirubin 1.5 (*)    GFR calc non Af Amer 58 (*)    All other components within normal limits  URINALYSIS, ROUTINE W REFLEX MICROSCOPIC - Abnormal; Notable for the following:    Color, Urine AMBER (*)    APPearance CLOUDY (*)    Hgb urine dipstick LARGE (*)    Protein, ur 100 (*)    Leukocytes, UA LARGE (*)    Bacteria, UA MANY (*)    All other components within normal limits  PROTIME-INR - Abnormal; Notable for  the following:    Prothrombin Time 27.0 (*)    All other components within normal limits  BRAIN NATRIURETIC PEPTIDE - Abnormal; Notable for the following:    B Natriuretic Peptide 204.9 (*)    All other components within normal limits  URINE CULTURE  I-STAT CG4 LACTIC ACID, ED  I-STAT TROPOININ, ED  I-STAT CG4 LACTIC ACID, ED    EKG  EKG Interpretation  Date/Time:  Tuesday November 02 2016 22:26:19 EST Ventricular Rate:  99 PR Interval:    QRS Duration: 126 QT Interval:  364 QTC Calculation: 467 R Axis:   5 Text Interpretation:  Atrial fibrillation Non-specific intra-ventricular conduction block Abnormal ECG No significant change since last tracing Confirmed by Jessalynn Mccowan MD, Lysbeth Galas [phone]) on 11/03/2016 12:22:28 AM       Radiology Dg Chest 2 View  Result Date: 11/02/2016 CLINICAL DATA:  Status post fall, with worsening weakness, subacute onset. Initial encounter. EXAM: CHEST  2 VIEW COMPARISON:  Chest radiograph from 02/19/2016 FINDINGS: The lungs are well-aerated. Vascular congestion is noted. There is no evidence of focal opacification, pleural effusion or pneumothorax. The heart is normal in size; the mediastinal contour is within normal limits. No acute osseous abnormalities are seen. IMPRESSION: Vascular congestion noted. Lungs remain grossly clear. No displaced rib fracture seen. Electronically Signed   By: Garald Balding M.D.   On: 11/02/2016 22:32   Ct Head Wo Contrast  Result Date: 11/02/2016 CLINICAL DATA:  71 year old male fell while trying to sit up and chair. Progressive weakness over the past 2 weeks. On blood  thinner. Did not hit head. Initial encounter. EXAM: CT HEAD WITHOUT CONTRAST TECHNIQUE: Contiguous axial images were obtained from the base of the skull through the vertex without intravenous contrast. COMPARISON:  02/19/2016. FINDINGS: Brain: No intracranial hemorrhage or CT evidence of large acute infarct. Chronic microvascular changes. Global atrophy without  hydrocephalus. No intracranial mass lesion noted on this unenhanced exam. Vascular: Vascular calcifications. Skull: No skull fracture. Sinuses/Orbits: No acute orbital abnormality. Visualized sinuses are clear. Other: Negative. IMPRESSION: No acute intracranial abnormality. Atrophy. Electronically Signed   By: Genia Del M.D.   On: 11/02/2016 22:25    Procedures Procedures (including critical care time)  Medications Ordered in ED Medications  cefTRIAXone (ROCEPHIN) 1 g in dextrose 5 % 50 mL IVPB (not administered)  ipratropium-albuterol (DUONEB) 0.5-2.5 (3) MG/3ML nebulizer solution 3 mL (3 mLs Nebulization Given 11/02/16 2308)     Initial Impression / Assessment and Plan / ED Course  I have reviewed the triage vital signs and the nursing notes.  Pertinent labs & imaging results that were available during my care of the patient were reviewed by me and considered in my medical decision making (see chart for details).     71 yo M here with generalized weakness, increased frequency of falls, and confusion. VSS on arrival, with exception of mild tachycardia. Exam as above - pt non-toxic. No apparent traumatic injury. Given his generalized weakness and falls, medical w/u obtained and is + for mild dehydration, UTI. CT Head neg. Will admit for AMS, increasing falls 2/2 UTI. Rocephin given.  Final Clinical Impressions(s) / ED Diagnoses   Final diagnoses:  Fall  Complicated UTI (urinary tract infection)    New Prescriptions New Prescriptions   No medications on file     Duffy Bruce, MD 11/03/16 0023

## 2016-11-03 ENCOUNTER — Inpatient Hospital Stay (HOSPITAL_BASED_OUTPATIENT_CLINIC_OR_DEPARTMENT_OTHER): Payer: Medicare Other

## 2016-11-03 DIAGNOSIS — N179 Acute kidney failure, unspecified: Secondary | ICD-10-CM | POA: Diagnosis present

## 2016-11-03 DIAGNOSIS — W19XXXA Unspecified fall, initial encounter: Secondary | ICD-10-CM | POA: Diagnosis not present

## 2016-11-03 DIAGNOSIS — N3001 Acute cystitis with hematuria: Secondary | ICD-10-CM | POA: Diagnosis not present

## 2016-11-03 DIAGNOSIS — K219 Gastro-esophageal reflux disease without esophagitis: Secondary | ICD-10-CM | POA: Diagnosis not present

## 2016-11-03 DIAGNOSIS — R601 Generalized edema: Secondary | ICD-10-CM | POA: Diagnosis not present

## 2016-11-03 LAB — BASIC METABOLIC PANEL
ANION GAP: 8 (ref 5–15)
BUN: 23 mg/dL — ABNORMAL HIGH (ref 6–20)
CHLORIDE: 100 mmol/L — AB (ref 101–111)
CO2: 26 mmol/L (ref 22–32)
CREATININE: 1.12 mg/dL (ref 0.61–1.24)
Calcium: 8.3 mg/dL — ABNORMAL LOW (ref 8.9–10.3)
GFR calc non Af Amer: >60 mL/min (ref >60)
Glucose, Bld: 121 mg/dL — ABNORMAL HIGH (ref 65–99)
POTASSIUM: 3.5 mmol/L (ref 3.5–5.1)
SODIUM: 134 mmol/L — AB (ref 135–145)

## 2016-11-03 LAB — CK: CK TOTAL: 199 U/L (ref 49–397)

## 2016-11-03 LAB — CBC
HCT: 37.9 % — ABNORMAL LOW (ref 39.0–52.0)
HEMOGLOBIN: 12.8 g/dL — AB (ref 13.0–17.0)
MCH: 29.8 pg (ref 26.0–34.0)
MCHC: 33.8 g/dL (ref 30.0–36.0)
MCV: 88.3 fL (ref 78.0–100.0)
PLATELETS: 273 10*3/uL (ref 150–400)
RBC: 4.29 MIL/uL (ref 4.22–5.81)
RDW: 16 % — ABNORMAL HIGH (ref 11.5–15.5)
WBC: 9.7 10*3/uL (ref 4.0–10.5)

## 2016-11-03 LAB — SODIUM, URINE, RANDOM: Sodium, Ur: 30 mmol/L

## 2016-11-03 LAB — ECHOCARDIOGRAM COMPLETE

## 2016-11-03 LAB — I-STAT CG4 LACTIC ACID, ED: LACTIC ACID, VENOUS: 0.67 mmol/L (ref 0.5–1.9)

## 2016-11-03 LAB — CREATININE, URINE, RANDOM: Creatinine, Urine: 99.11 mg/dL

## 2016-11-03 MED ORDER — TIZANIDINE HCL 2 MG PO TABS
6.0000 mg | ORAL_TABLET | Freq: Four times a day (QID) | ORAL | Status: DC | PRN
  Filled 2016-11-03: qty 3

## 2016-11-03 MED ORDER — RANOLAZINE ER 500 MG PO TB12
500.0000 mg | ORAL_TABLET | Freq: Two times a day (BID) | ORAL | Status: DC
Start: 2016-11-03 — End: 2016-11-05
  Administered 2016-11-03 – 2016-11-05 (×5): 500 mg via ORAL
  Filled 2016-11-03 (×6): qty 1

## 2016-11-03 MED ORDER — RIVAROXABAN 20 MG PO TABS
20.0000 mg | ORAL_TABLET | Freq: Every day | ORAL | Status: DC
  Administered 2016-11-03 – 2016-11-05 (×3): 20 mg via ORAL
  Filled 2016-11-03 (×4): qty 1

## 2016-11-03 MED ORDER — POLYETHYLENE GLYCOL 3350 17 G PO PACK: 17.0000 g | PACK | Freq: Every day | ORAL | Status: DC | PRN

## 2016-11-03 MED ORDER — DOXYCYCLINE HYCLATE 100 MG PO TABS
100.0000 mg | ORAL_TABLET | Freq: Every day | ORAL | Status: DC
  Administered 2016-11-03 – 2016-11-05 (×3): 100 mg via ORAL
  Filled 2016-11-03 (×3): qty 1

## 2016-11-03 MED ORDER — PANTOPRAZOLE SODIUM 40 MG PO TBEC
40.0000 mg | DELAYED_RELEASE_TABLET | Freq: Every day | ORAL | Status: DC
  Administered 2016-11-03 – 2016-11-05 (×3): 40 mg via ORAL
  Filled 2016-11-03 (×5): qty 1

## 2016-11-03 MED ORDER — AMIODARONE HCL 200 MG PO TABS
200.0000 mg | ORAL_TABLET | Freq: Every day | ORAL | Status: DC
  Administered 2016-11-03 – 2016-11-05 (×3): 200 mg via ORAL
  Filled 2016-11-03 (×3): qty 1

## 2016-11-03 MED ORDER — DULOXETINE HCL 30 MG PO CPEP
60.0000 mg | ORAL_CAPSULE | Freq: Two times a day (BID) | ORAL | Status: DC
  Administered 2016-11-03 – 2016-11-05 (×5): 60 mg via ORAL
  Filled 2016-11-03 (×5): qty 2

## 2016-11-03 MED ORDER — ALBUTEROL SULFATE (2.5 MG/3ML) 0.083% IN NEBU: 2.5000 mg | INHALATION_SOLUTION | RESPIRATORY_TRACT | Status: DC | PRN

## 2016-11-03 MED ORDER — BUPROPION HCL ER (XL) 300 MG PO TB24
300.0000 mg | ORAL_TABLET | Freq: Every day | ORAL | Status: DC
  Administered 2016-11-03 – 2016-11-05 (×3): 300 mg via ORAL
  Filled 2016-11-03: qty 2
  Filled 2016-11-03 (×2): qty 1

## 2016-11-03 MED ORDER — DEXTROSE 5 % IV SOLN
1.0000 g | Freq: Every day | INTRAVENOUS | Status: DC
  Administered 2016-11-03 – 2016-11-04 (×2): 1 g via INTRAVENOUS
  Filled 2016-11-03 (×3): qty 10

## 2016-11-03 MED ORDER — ONDANSETRON HCL 4 MG PO TABS: 4.0000 mg | ORAL_TABLET | Freq: Four times a day (QID) | ORAL | Status: DC | PRN

## 2016-11-03 MED ORDER — SODIUM CHLORIDE 0.9% FLUSH
3.0000 mL | Freq: Two times a day (BID) | INTRAVENOUS | Status: DC
  Administered 2016-11-03 – 2016-11-05 (×5): 3 mL via INTRAVENOUS

## 2016-11-03 MED ORDER — LORATADINE 10 MG PO TABS
10.0000 mg | ORAL_TABLET | Freq: Every day | ORAL | Status: DC
  Administered 2016-11-03 – 2016-11-05 (×3): 10 mg via ORAL
  Filled 2016-11-03 (×3): qty 1

## 2016-11-03 MED ORDER — NITROGLYCERIN 0.4 MG SL SUBL: 0.4000 mg | SUBLINGUAL_TABLET | SUBLINGUAL | Status: DC | PRN

## 2016-11-03 MED ORDER — ATORVASTATIN CALCIUM 40 MG PO TABS
80.0000 mg | ORAL_TABLET | Freq: Every evening | ORAL | Status: DC
  Administered 2016-11-03 – 2016-11-05 (×3): 80 mg via ORAL
  Filled 2016-11-03: qty 2
  Filled 2016-11-03: qty 1
  Filled 2016-11-03 (×2): qty 2

## 2016-11-03 MED ORDER — TAMSULOSIN HCL 0.4 MG PO CAPS
0.4000 mg | ORAL_CAPSULE | Freq: Every evening | ORAL | Status: DC
  Administered 2016-11-03 – 2016-11-05 (×3): 0.4 mg via ORAL
  Filled 2016-11-03 (×3): qty 1

## 2016-11-03 MED ORDER — HYDROCODONE-ACETAMINOPHEN 7.5-325 MG PO TABS
1.0000 | ORAL_TABLET | Freq: Four times a day (QID) | ORAL | Status: DC
  Administered 2016-11-03 – 2016-11-05 (×11): 1 via ORAL
  Filled 2016-11-03 (×11): qty 1

## 2016-11-03 MED ORDER — VITAMIN C 500 MG PO TABS
1000.0000 mg | ORAL_TABLET | Freq: Every day | ORAL | Status: DC
  Administered 2016-11-03 – 2016-11-05 (×3): 1000 mg via ORAL
  Filled 2016-11-03 (×3): qty 2

## 2016-11-03 MED ORDER — CALCIUM CARBONATE 1250 (500 CA) MG PO TABS
1250.0000 mg | ORAL_TABLET | Freq: Two times a day (BID) | ORAL | Status: DC
  Administered 2016-11-03 – 2016-11-05 (×5): 1250 mg via ORAL
  Filled 2016-11-03 (×6): qty 1

## 2016-11-03 MED ORDER — TESTOSTERONE CYPIONATE 200 MG/ML IM SOLN: 200.0000 mg | INTRAMUSCULAR | Status: DC

## 2016-11-03 MED ORDER — CLONAZEPAM 1 MG PO TABS
1.0000 mg | ORAL_TABLET | Freq: Four times a day (QID) | ORAL | Status: DC
  Administered 2016-11-03 – 2016-11-05 (×11): 1 mg via ORAL
  Filled 2016-11-03 (×2): qty 1
  Filled 2016-11-03: qty 2
  Filled 2016-11-03 (×6): qty 1
  Filled 2016-11-03: qty 2
  Filled 2016-11-03: qty 1

## 2016-11-03 MED ORDER — VITAMIN D 1000 UNITS PO TABS
4000.0000 [IU] | ORAL_TABLET | Freq: Every day | ORAL | Status: DC
  Administered 2016-11-03 – 2016-11-05 (×3): 4000 [IU] via ORAL
  Filled 2016-11-03 (×3): qty 4

## 2016-11-03 MED ORDER — ZOLPIDEM TARTRATE 5 MG PO TABS
5.0000 mg | ORAL_TABLET | Freq: Every evening | ORAL | Status: DC | PRN
  Administered 2016-11-03: 5 mg via ORAL
  Filled 2016-11-03: qty 1

## 2016-11-03 MED ORDER — ONDANSETRON HCL 4 MG/2ML IJ SOLN: 4.0000 mg | Freq: Four times a day (QID) | INTRAMUSCULAR | Status: DC | PRN

## 2016-11-03 MED ORDER — ACETAMINOPHEN 325 MG PO TABS: 650.0000 mg | ORAL_TABLET | Freq: Four times a day (QID) | ORAL | Status: DC | PRN

## 2016-11-03 MED ORDER — PERFLUTREN LIPID MICROSPHERE
INTRAVENOUS | Status: AC
  Filled 2016-11-03: qty 10

## 2016-11-03 MED ORDER — ADULT MULTIVITAMIN W/MINERALS CH
1.0000 | ORAL_TABLET | Freq: Every day | ORAL | Status: DC
  Administered 2016-11-03 – 2016-11-05 (×3): 1 via ORAL
  Filled 2016-11-03 (×3): qty 1

## 2016-11-03 MED ORDER — DILTIAZEM HCL ER COATED BEADS 240 MG PO CP24
240.0000 mg | ORAL_CAPSULE | Freq: Every morning | ORAL | Status: DC
  Administered 2016-11-03 – 2016-11-05 (×3): 240 mg via ORAL
  Filled 2016-11-03 (×3): qty 1

## 2016-11-03 MED ORDER — FERROUS SULFATE 325 (65 FE) MG PO TABS
325.0000 mg | ORAL_TABLET | Freq: Every day | ORAL | Status: DC
  Administered 2016-11-03 – 2016-11-05 (×3): 325 mg via ORAL
  Filled 2016-11-03 (×3): qty 1

## 2016-11-03 MED ORDER — LEVOTHYROXINE SODIUM 100 MCG PO TABS
200.0000 ug | ORAL_TABLET | Freq: Every day | ORAL | Status: DC
  Administered 2016-11-03 – 2016-11-05 (×3): 200 ug via ORAL
  Filled 2016-11-03 (×2): qty 2
  Filled 2016-11-03 (×2): qty 1

## 2016-11-03 MED ORDER — ACETAMINOPHEN 650 MG RE SUPP: 650.0000 mg | Freq: Four times a day (QID) | RECTAL | Status: DC | PRN

## 2016-11-03 MED ORDER — RISAQUAD PO CAPS
1.0000 | ORAL_CAPSULE | Freq: Every day | ORAL | Status: DC
  Administered 2016-11-03 – 2016-11-05 (×3): 1 via ORAL
  Filled 2016-11-03 (×4): qty 1

## 2016-11-03 MED ORDER — DM-GUAIFENESIN ER 30-600 MG PO TB12
1.0000 | ORAL_TABLET | Freq: Two times a day (BID) | ORAL | Status: DC
  Administered 2016-11-03 – 2016-11-05 (×5): 1 via ORAL
  Filled 2016-11-03 (×5): qty 1

## 2016-11-03 NOTE — Evaluation (Signed)
Occupational Therapy Evaluation Patient Details Name: [name] MRN: [id number] DOB: [birth year] Today's Date: 11/03/2016    History of Present Illness Pt admitted through ED s/p fall with confusion and UTI.  Pt with hx of R THR with multiple revisions, bilat wrist surgery and bil TKR   Clinical Impression   This 71 year old man was admitted for the above.  He will benefit from continued OT in acute setting to maximize participation in adls to decrease burden of care at home. Pt is hopeful of returning home with significant other and performing rehab in that setting.  RN reports that his significant other has recently been his caregiver.    Follow Up Recommendations  Supervision/Assistance - 24 hour    Equipment Recommendations  None recommended by OT    Recommendations for Other Services       Precautions / Restrictions Precautions Precautions: Fall Restrictions Weight Bearing Restrictions: No      Mobility Bed Mobility Overal bed mobility: Needs Assistance Bed Mobility: Supine to Sit;Sit to Supine     Supine to sit: Min assist;Mod assist Sit to supine: Min assist;Mod assist   General bed mobility comments: cues for sequence and assist to manage LEs on/off bed  Transfers Overall transfer level: Needs assistance Equipment used: Rolling walker (2 wheeled) Transfers: Sit to/from Stand Sit to Stand: Min assist;+2 physical assistance;+2 safety/equipment         General transfer comment: cues for use of UEs to self assist    Balance Overall balance assessment: Needs assistance Sitting-balance support: No upper extremity supported;Feet supported Sitting balance-Leahy Scale: Good     Standing balance support: Bilateral upper extremity supported Standing balance-Leahy Scale: Poor                              ADL Overall ADL's : Needs assistance/impaired             Lower Body Bathing: Minimal assistance;Sit to/from stand;With adaptive  equipment;+2 for safety/equipment       Lower Body Dressing: Minimal assistance;Sit to/from stand;With adaptive equipment;+2 for safety/equipment   Toilet Transfer: Minimal assistance;+2 for safety/equipment;RW (to stretcher)             General ADL Comments: pt has AE at home and uses this for ADLs. He can perform UB adls with set up.   He ambulated in room, but fatiqued easily.  RR up to 35 and HR up to 112     Vision     Perception     Praxis      Pertinent Vitals/Pain Pain Assessment: Faces Faces Pain Scale: Hurts little more Pain Location: bil hips Pain Descriptors / Indicators: Aching Pain Intervention(s): Limited activity within patient's tolerance;Monitored during session;Repositioned;Premedicated before session     Hand Dominance     Extremity/Trunk Assessment Upper Extremity Assessment Upper Extremity Assessment: Overall WFL for tasks assessed          Communication Communication Communication: No difficulties   Cognition Arousal/Alertness: Awake/alert Behavior During Therapy: WFL for tasks assessed/performed Overall Cognitive Status: Within Functional Limits for tasks assessed.  Pt reports that significant other reports that he was confused but he feels cleared at this time.  WFLs during this session                     General Comments       Exercises       Shoulder Instructions  Home Living Family/patient expects to be discharged to:: Private residence Living Arrangements: Spouse/significant other Available Help at Discharge: Family;Available 24 hours/day Type of Home: House Home Access: Stairs to enter [employer] of Steps: 4 Entrance Stairs-Rails: Right Home Layout: One level     Bathroom Shower/Tub: Teacher, early years/pre: Handicapped height     Home Equipment: Environmental consultant - 2 wheels;Bedside commode;Cane - single point   Additional Comments: pt states he has all DME at home (and then some).  He has  had multiple orthopedic sxs      Prior Functioning/Environment Level of Independence: Independent with assistive device(s)        Comments: Pt using RW        OT Problem List: Decreased strength;Decreased activity tolerance;Impaired balance (sitting and/or standing);Pain;Cardiopulmonary status limiting activity   OT Treatment/Interventions:      OT Goals(Current goals can be found in the care plan section) Acute Rehab OT Goals Patient Stated Goal: Get home and start rehab all over again OT Goal Formulation: With patient Time For Goal Achievement: 11/10/16 Potential to Achieve Goals: Good ADL Goals Pt Will Perform Grooming: with min guard assist;standing (x 2 tasks) Pt Will Transfer to Toilet: ambulating;bedside commode;with min guard assist Pt Will Perform Toileting - Clothing Manipulation and hygiene: with min guard assist;sit to/from stand Additional ADL Goal #1: pt will perform bed mobility at min A level in preparation for adls  OT Frequency:     Barriers to D/C:            Co-evaluation PT/OT/SLP Co-Evaluation/Treatment: Yes Reason for Co-Treatment: For patient/therapist safety PT goals addressed during session: Mobility/safety with mobility OT goals addressed during session: ADL's and self-care      End of Session    Activity Tolerance: Patient limited by fatigue Patient left: in bed;with call bell/phone within reach   Time: 1333-1349 OT Time Calculation (min): 16 min Charges:  OT General Charges $OT Visit: 1 Procedure OT Evaluation $OT Eval Low Complexity: 1 Procedure G-Codes:    Quinnie Barcelo 11-29-2016, 3:32 PM  Lesle Chris, OTR/L [phone] Nov 29, 2016

## 2016-11-03 NOTE — Evaluation (Signed)
Physical Therapy Evaluation Patient Details Name: [name] MRN: [id number] DOB: [birth year] Today's Date: 11/03/2016   History of Present Illness  Pt admitted through ED s/p fall with confusion and UTI.  Pt with hx of R THR with multiple revisions, bilat wrist surgery and bil TKR  Clinical Impression  Pt admitted as above and presenting with functional mobility limitations 2* generalized weakness, pain, limited endurance and ambulatory balance deficits.  Pt very motivated to progress to dc home with family assist for recovery.    Follow Up Recommendations Home health PT    Equipment Recommendations  None recommended by PT    Recommendations for Other Services OT consult     Precautions / Restrictions Precautions Precautions: Fall Restrictions Weight Bearing Restrictions: No      Mobility  Bed Mobility Overal bed mobility: Needs Assistance Bed Mobility: Supine to Sit;Sit to Supine     Supine to sit: Min assist;Mod assist Sit to supine: Min assist;Mod assist   General bed mobility comments: cues for sequence and assist to manage LEs on/off bed  Transfers Overall transfer level: Needs assistance Equipment used: Rolling walker (2 wheeled) Transfers: Sit to/from Stand Sit to Stand: Min assist;+2 physical assistance;+2 safety/equipment         General transfer comment: cues for use of UEs to self assist  Ambulation/Gait Ambulation/Gait assistance: Min assist;+2 safety/equipment Ambulation Distance (Feet): 10 Feet Assistive device: Rolling walker (2 wheeled) Gait Pattern/deviations: Step-to pattern;Step-through pattern;Decreased step length - right;Decreased step length - left;Shuffle;Trunk flexed Gait velocity: decr Gait velocity interpretation: Below normal speed for age/gender General Gait Details: cues for posture, position from RW and initial sequence.  Distance ltd by fatigue and pt c/o LE weakness  Stairs            Wheelchair Mobility     Modified Rankin (Stroke Patients Only)       Balance Overall balance assessment: Needs assistance Sitting-balance support: No upper extremity supported;Feet supported Sitting balance-Leahy Scale: Good     Standing balance support: Bilateral upper extremity supported Standing balance-Leahy Scale: Poor                               Pertinent Vitals/Pain Pain Assessment: Faces Faces Pain Scale: Hurts little more Pain Location: bilat hips Pain Descriptors / Indicators: Aching Pain Intervention(s): Limited activity within patient's tolerance;Monitored during session    Home Living Family/patient expects to be discharged to:: Private residence   Available Help at Discharge: Family;Available 24 hours/day Type of Home: House Home Access: Stairs to enter Entrance Stairs-Rails: Right Entrance Stairs-Number of Steps: 4 Home Layout: One level Home Equipment: Walker - 2 wheels;Bedside commode;Cane - single point      Prior Function Level of Independence: Independent with assistive device(s)         Comments: Pt using RW     Hand Dominance        Extremity/Trunk Assessment   Upper Extremity Assessment Upper Extremity Assessment: Defer to OT evaluation    Lower Extremity Assessment Lower Extremity Assessment: Generalized weakness       Communication   Communication: No difficulties  Cognition Arousal/Alertness: Awake/alert Behavior During Therapy: WFL for tasks assessed/performed Overall Cognitive Status: Within Functional Limits for tasks assessed                      General Comments      Exercises     Assessment/Plan    PT  Assessment Patient needs continued PT services  PT Problem List Decreased strength;Decreased activity tolerance;Decreased balance;Decreased mobility;Decreased knowledge of use of DME;Obesity;Pain          PT Treatment Interventions DME instruction;Gait training;Stair training;Functional mobility  training;Therapeutic activities;Therapeutic exercise;Patient/family education    PT Goals (Current goals can be found in the Care Plan section)  Acute Rehab PT Goals Patient Stated Goal: Get home and start rehab all over again PT Goal Formulation: With patient Time For Goal Achievement: 11/17/16 Potential to Achieve Goals: Good    Frequency Min 3X/week   Barriers to discharge        Co-evaluation PT/OT/SLP Co-Evaluation/Treatment: Yes Reason for Co-Treatment: For patient/therapist safety PT goals addressed during session: Mobility/safety with mobility OT goals addressed during session: ADL's and self-care       End of Session Equipment Utilized During Treatment: Gait belt Activity Tolerance: Patient tolerated treatment well;Patient limited by fatigue Patient left: in bed;with call bell/phone within reach Nurse Communication: Mobility status         Time: ID:[id number] PT Time Calculation (min) (ACUTE ONLY): 20 min   Charges:   PT Evaluation $PT Eval Low Complexity: 1 Procedure     PT G Codes:        Sacha Radloff 11/16/16, 2:44 PM

## 2016-11-03 NOTE — H&P (Signed)
History and Physical    [name] [id number] DOB: 16-Sep-1946 DOA: 11/02/2016  Referring MD/NP/PA:   PCP: Janie Morning, DO   Patient coming from:  The patient is coming from home.  At baseline, pt is independent for most of ADL.  Chief Complaint: Increased urinary frequency, burning on urination, generalized weakness, fall  HPI: [name] is a 71 y.o. male with medical history significant of hypertension, hyperlipidemia, GERD, hypothyroidism, depression, anxiety, psoriasis on Enbrel, COPD, BPH, atrial fibrillation on Xarelto, who presents with increased urinary frequency, burning on urination, generalized weakness, fall.   Pt states that he has increased urinary frequency, burning on urination and generalized weakness recently. He states that blood was noted in urine some times. He also has bilateral leg muscle pain. He states that he has bilateral knee replacement, and right hip replacement, which makes him feel weak. He fell 3 time since Friday. No LOC. Denies head or neck injury. No unilateral weakness, numbness or tingling in extremities. No vision change, hearing loss, slurred speech or facial droop.  Patient has mild cough with white mucus production, but no shortness of breath, chest pain, fever, chills, runny nose or sore throat. He denies nausea, vomiting, diarrhea, abdominal pain. Per report, patient had mild confusion, which has resolved currently. He is mentally clear, oriented 3 when I saw patient in ED. Pt states that he is taking doxycycline chronically for prophylaxis of dental infection.  ED Course: pt was found to have positive urinalysis with large amount of leukocytes, lactate 1.02, WBC 9.2, INR 2.45, negative troponin, BNP 204.9, AKI with creatinine 1.21, temperature normal, tachycardia, oxygen saturation 93% on room air, chest x-ray showed vascular congestion without any infiltration, negative CT-head for acute intracranial abnormalities.  Review of  Systems:   General: no fevers, chills, no changes in body weight, has fatigue HEENT: no blurry vision, hearing changes or sore throat Respiratory: no dyspnea, has coughing, no wheezing CV: no chest pain, no palpitations GI: no nausea, vomiting, abdominal pain, diarrhea, constipation GU: no dysuria, has burning on urination, increased urinary frequency, hematuria  Ext: has leg edema Neuro: no unilateral weakness, numbness, or tingling, no vision change or hearing loss Skin: no rash, no skin tear. MSK: No muscle spasm, no deformity, no limitation of range of movement in spin Heme: No easy bruising.  Travel history: No recent long distant travel.  Allergy: No Known Allergies  Past Medical History:  Diagnosis Date  . A-fib (HCC)    hx.- sinus rhythm now  . Acid reflux   . Acute blood loss anemia   . Anxiety   . Arthritis   . BPH (benign prostatic hyperplasia)   . CAD (coronary artery disease)   . Depression   . Dislocation of internal right hip prosthesis, sequela   . Environmental allergies   . Fracture of wrist 10/23/13   LEFT  . High cholesterol   . HLD (hyperlipidemia)   . Low testosterone, possible hypogonadism 10/01/2012  . Lumbago   . Metabolic bone disease   . Osteoporosis   . Plaque psoriasis   . Sleep apnea    cpap machine-settings 17  . Thrombocytopenia ([place])   . Thyroid disease    HX GRAVES DISEASE  . Transfusion history    s/p 12'13 hip surgery  . Unsteady gait   . Unsteady gait   . Vertebral compression fracture (HCC)    L1-wears brace    Past Surgical History:  Procedure Laterality Date  . APPENDECTOMY    .  CARDIAC CATHETERIZATION  2013  . HARDWARE REMOVAL  10/05/2012   Procedure: HARDWARE REMOVAL;  Surgeon: [name], MD;  Location: [facility];  Service: Orthopedics;  Laterality: Right;  REMOVING  STRYKER  GAMMA NAIL  . HARDWARE REMOVAL Right 07/03/2013   Procedure: HARDWARE REMOVAL RIGHT TIBIA ;  Surgeon: Rozanna Box, MD;  Location: [facility];   Service: Orthopedics;  Laterality: Right;  . HIP CLOSED REDUCTION Right 10/15/2013   Procedure: CLOSED MANIPULATION HIP;  Surgeon: [name], MD;  Location: [facility];  Service: Orthopedics;  Laterality: Right;  . INCISION AND DRAINAGE HIP Right 11/16/2013   Procedure: IRRIGATION AND DEBRIDEMENT RIGHT HIP;  Surgeon: [name], MD;  Location: [facility];  Service: Orthopedics;  Laterality: Right;  . JOINT REPLACEMENT     hip-right x2  . KNEE SURGERY Bilateral 2012   Total knee replacements  . ORIF TIBIA FRACTURE Right 02/06/2013   Procedure: OPEN REDUCTION INTERNAL FIXATION (ORIF) TIBIA FRACTURE WITH IM ROD FIBULA;  Surgeon: Rozanna Box, MD;  Location: [facility];  Service: Orthopedics;  Laterality: Right;  . ORIF TIBIA FRACTURE Right 07/03/2013   Procedure: RIGHT TIBIA NON UNION REPAIR ;  Surgeon: Rozanna Box, MD;  Location: [place];  Service: Orthopedics;  Laterality: Right;  . ORIF WRIST FRACTURE  10/02/2012   Procedure: OPEN REDUCTION INTERNAL FIXATION (ORIF) WRIST FRACTURE;  Surgeon: Roseanne Kaufman, MD;  Location: [facility];  Service: Orthopedics;  Laterality: Right;  WITH   ANTIBIOTIC  CEMENT  . ORIF WRIST FRACTURE Left 10/28/2013   Procedure: OPEN REDUCTION INTERNAL FIXATION (ORIF) WRIST FRACTURE with allograft;  Surgeon: Roseanne Kaufman, MD;  Location[facility];  Service: Orthopedics;  Laterality: Left;  DVR Plate  . right femur surgery  05/2012  . TONSILLECTOMY    . TOTAL HIP REVISION  10/05/2012   Procedure: TOTAL HIP REVISION;  Surgeon: [name], MD;  Location[facility];  Service: Orthopedics;  Laterality: Right;  RIGHT TOTAL HIP REVISION  . TOTAL HIP REVISION Right 09/17/2013   Procedure: REVISION RIGHT TOTAL HIP ARTHROPLASTY ;  Surgeon: [name], MD;  Location[facility];  Service: Orthopedics;  Laterality: Right;  . TOTAL HIP REVISION Right 10/26/2013   Procedure: REVISION RIGHT TOTAL HIP ARTHROPLASTY;  Surgeon: [name], MD;  Location[facility];  Service: Orthopedics;   Laterality: Right;  . WRIST FRACTURE SURGERY  05/2012    Social History:  reports that he quit smoking about 4 years ago. He has never used smokeless tobacco. He reports that he drinks alcohol. He reports that he does not use drugs.  Family History:  Family History  Problem Relation Age of Onset  . CAD Father 60  . Arthritis Mother      Prior to Admission medications   Medication Sig Start Date End Date Taking? Authorizing Provider  amiodarone (PACERONE) 200 MG tablet Take 200 mg by mouth daily.   Yes Historical Provider, MD  Ascorbic Acid (VITAMIN C) 1000 MG tablet Take 1,000 mg by mouth daily.   Yes Historical Provider, MD  atorvastatin (LIPITOR) 80 MG tablet Take 80 mg by mouth every evening.    Yes Historical Provider, MD  buPROPion (WELLBUTRIN XL) 300 MG 24 hr tablet Take 300 mg by mouth daily with breakfast.    Yes Historical Provider, MD  CALCIUM PO Take 400 mg by mouth 2 (two) times daily.   Yes Historical Provider, MD  Cholecalciferol 4000 units CAPS Take 4,000 Units by mouth daily.  Yes Historical Provider, MD  clonazePAM (KLONOPIN) 1 MG tablet Take 1 mg by mouth 4 (four) times daily.    Yes Historical Provider, MD  denosumab (PROLIA) 60 MG/ML SOLN injection Inject 60 mg into the skin every 6 (six) months. Administer in upper arm, thigh, or abdomen   Yes Historical Provider, MD  diltiazem (CARDIZEM CD) 240 MG 24 hr capsule Take 240 mg by mouth every morning.  05/19/13  Yes Historical Provider, MD  doxycycline (VIBRA-TABS) 100 MG tablet Take 100 mg by mouth daily.   Yes Historical Provider, MD  DULoxetine (CYMBALTA) 60 MG capsule Take 60 mg by mouth 2 (two) times daily.    Yes Historical Provider, MD  etanercept (ENBREL) 50 MG/ML injection Inject 50 mg into the skin 2 (two) times a week. Takes on Tuesday's and Friday's   Yes Historical Provider, MD  ferrous sulfate 325 (65 FE) MG tablet Take 325 mg by mouth daily.    Yes Historical Provider, MD  HYDROcodone-acetaminophen (NORCO)  7.5-325 MG tablet Take 1 tablet by mouth every 6 (six) hours.   Yes Historical Provider, MD  levothyroxine (SYNTHROID, LEVOTHROID) 200 MCG tablet Take 200 mcg by mouth daily before breakfast.    Yes Historical Provider, MD  loratadine (CLARITIN) 10 MG tablet Take 10 mg by mouth daily.    Yes Historical Provider, MD  Multiple Vitamin (MULTIVITAMIN WITH MINERALS) TABS tablet Take 1 tablet by mouth daily.   Yes Historical Provider, MD  nitroGLYCERIN (NITROSTAT) 0.4 MG SL tablet Place 0.4 mg under the tongue every 5 (five) minutes as needed for chest pain. x3 doses as needed for chest pain   Yes Historical Provider, MD  omeprazole (PRILOSEC) 40 MG capsule Take 40 mg by mouth daily.   Yes Historical Provider, MD  polyethylene glycol (MIRALAX / GLYCOLAX) packet Take 17 g by mouth daily as needed for mild constipation.    Yes Historical Provider, MD  Probiotic Product (PROBIOTIC DAILY) CAPS Take 1 capsule by mouth daily with breakfast.   Yes Historical Provider, MD  ranolazine (RANEXA) 500 MG 12 hr tablet Take 500 mg by mouth 2 (two) times daily.    Yes Historical Provider, MD  rivaroxaban (XARELTO) 20 MG TABS tablet Take 20 mg by mouth daily with supper.    Yes Historical Provider, MD  Tamsulosin HCl (FLOMAX) 0.4 MG CAPS Take 0.4 mg by mouth every evening.    Yes Historical Provider, MD  testosterone cypionate (DEPOTESTOSTERONE CYPIONATE) 200 MG/ML injection Inject 200mg  (8ml) intramuscularly every 14 days 03/30/16  Yes Lauree Chandler, NP  tizanidine (ZANAFLEX) 6 MG capsule Take 6 mg by mouth every 6 (six) hours.    Yes Historical Provider, MD    Physical Exam: Vitals:   11/02/16 1955 11/02/16 2302 11/02/16 2308 11/02/16 2323  BP: 139/59 142/68    Pulse: 99 100    Resp: 18 20    Temp: 98.7 F (37.1 C)     TempSrc: Oral     SpO2: 100% 94% 98% 93%   General: Not in acute distress HEENT:       Eyes: PERRL, EOMI, no scleral icterus.       ENT: No discharge from the ears and nose, no pharynx  injection, no tonsillar enlargement.        Neck: Difficult to assess JVD due to obesity. no bruit, no mass felt. Heme: No neck lymph node enlargement. Cardiac: S1/S2, RRR, No murmurs, No gallops or rubs. Respiratory: No rales, wheezing, rhonchi or rubs. GI: Soft,  nondistended, nontender, no rebound pain, no organomegaly, BS present. GU: No hematuria Ext: 2+ pitting leg edema bilaterally. 2+DP/PT pulse bilaterally. Musculoskeletal: No joint deformities, No joint redness or warmth, no limitation of ROM in spin. Skin: No rashes.  Neuro: Alert, oriented X3, cranial nerves II-XII grossly intact, moves all extremities normally.   Psych: Patient is not psychotic, no suicidal or hemocidal ideation.  Labs on Admission: I have personally reviewed following labs and imaging studies  CBC:  Recent Labs Lab 11/02/16 2244  WBC 9.2  NEUTROABS 7.1  HGB 11.7*  HCT 34.7*  MCV 88.3  PLT 99991111   Basic Metabolic Panel:  Recent Labs Lab 11/02/16 2244  NA 136  K 3.7  CL 102  CO2 25  GLUCOSE 113*  BUN 27*  CREATININE 1.21  CALCIUM 8.3*   GFR: Estimated Creatinine Clearance: 83.2 mL/min (by C-G formula based on SCr of 1.21 mg/dL). Liver Function Tests:  Recent Labs Lab 11/02/16 2244  AST 45*  ALT 40  ALKPHOS 86  BILITOT 1.5*  PROT 7.2  ALBUMIN 2.8*   No results for input(s): LIPASE, AMYLASE in the last 168 hours. No results for input(s): AMMONIA in the last 168 hours. Coagulation Profile:  Recent Labs Lab 11/02/16 2244  INR 2.45   Cardiac Enzymes: No results for input(s): CKTOTAL, CKMB, CKMBINDEX, TROPONINI in the last 168 hours. BNP (last 3 results) No results for input(s): PROBNP in the last 8760 hours. HbA1C: No results for input(s): HGBA1C in the last 72 hours. CBG: No results for input(s): GLUCAP in the last 168 hours. Lipid Profile: No results for input(s): CHOL, HDL, LDLCALC, TRIG, CHOLHDL, LDLDIRECT in the last 72 hours. Thyroid Function Tests: No results for  input(s): TSH, T4TOTAL, FREET4, T3FREE, THYROIDAB in the last 72 hours. Anemia Panel: No results for input(s): VITAMINB12, FOLATE, FERRITIN, TIBC, IRON, RETICCTPCT in the last 72 hours. Urine analysis:    Component Value Date/Time   COLORURINE AMBER (A) 11/02/2016 2236   APPEARANCEUR CLOUDY (A) 11/02/2016 2236   LABSPEC 1.017 11/02/2016 2236   PHURINE 7.0 11/02/2016 2236   GLUCOSEU NEGATIVE 11/02/2016 2236   HGBUR LARGE (A) 11/02/2016 2236   BILIRUBINUR NEGATIVE 11/02/2016 2236   KETONESUR NEGATIVE 11/02/2016 2236   PROTEINUR 100 (A) 11/02/2016 2236   UROBILINOGEN 1.0 10/24/2013 1112   NITRITE NEGATIVE 11/02/2016 2236   LEUKOCYTESUR LARGE (A) 11/02/2016 2236   Sepsis Labs: @LABRCNTIP (procalcitonin:4,lacticidven:4) )No results found for this or any previous visit (from the past 240 hour(s)).   Radiological Exams on Admission: Dg Chest 2 View  Result Date: 11/02/2016 CLINICAL DATA:  Status post fall, with worsening weakness, subacute onset. Initial encounter. EXAM: CHEST  2 VIEW COMPARISON:  Chest radiograph from 02/19/2016 FINDINGS: The lungs are well-aerated. Vascular congestion is noted. There is no evidence of focal opacification, pleural effusion or pneumothorax. The heart is normal in size; the mediastinal contour is within normal limits. No acute osseous abnormalities are seen. IMPRESSION: Vascular congestion noted. Lungs remain grossly clear. No displaced rib fracture seen. Electronically Signed   By: Garald Balding M.D.   On: 11/02/2016 22:32   Ct Head Wo Contrast  Result Date: 11/02/2016 CLINICAL DATA:  71 year old male fell while trying to sit up and chair. Progressive weakness over the past 2 weeks. On blood thinner. Did not hit head. Initial encounter. EXAM: CT HEAD WITHOUT CONTRAST TECHNIQUE: Contiguous axial images were obtained from the base of the skull through the vertex without intravenous contrast. COMPARISON:  02/19/2016. FINDINGS: Brain: No intracranial hemorrhage  or  CT evidence of large acute infarct. Chronic microvascular changes. Global atrophy without hydrocephalus. No intracranial mass lesion noted on this unenhanced exam. Vascular: Vascular calcifications. Skull: No skull fracture. Sinuses/Orbits: No acute orbital abnormality. Visualized sinuses are clear. Other: Negative. IMPRESSION: No acute intracranial abnormality. Atrophy. Electronically Signed   By: Genia Del M.D.   On: 11/02/2016 22:25     EKG: Independently reviewed. Atrial fibrillation, QTC 467, poor R-wave progression, LAD.   Assessment/Plan Principal Problem:   UTI (urinary tract infection) Active Problems:   Hypothyroidism   Low testosterone, possible hypogonadism   HTN (hypertension)   Psoriasis   GERD (gastroesophageal reflux disease)   Anxiety   Depression   CAD (coronary artery disease)   Fall   Physical deconditioning   PAF (paroxysmal atrial fibrillation) (HCC)   Chronic anticoagulation   Hyperlipidemia   AKI (acute kidney injury) ([place])   UTI (urinary tract infection): Patient has increased urinary frequency, burning on urination and hematuria, plus positive urinalysis, consistent with UTI. Patient is not septic, lactate is normal. Hemodynamically stable.  -  Admit to telemetry bed as inpt -  Ceftriaxone by IV - Follow up results of urine and blood cx and amend antibiotic regimen if needed per sensitivity results - prn Zofran for nausea  Fall: Likely due to multifactorial etiology, including Physical deconditioning, UTI, AKI. no focal neurological findings, less likely to have stroke. -PT/OT -fall precaution -treat underlying issues  Hypothyroidism: Last TSH was 0.52 on 05/03/16 -Continue home Synthroid  Low testosterone, possible hypogonadism: -Testosterone injection every 2 weeks  Atrial Fibrillation: CHA2DS2-VASc Score is 3, needs oral anticoagulation. Patient is Xarelto at home. Heart rate is well controlled. -continue Xarelto, amiodarone and  Cardizem  HTN: -On Cardizem  Psoriasis: stablle -currently on Enbrel injection  GERD: -Protonix  Depression and anxiety: Stable, no suicidal or homicidal ideations. -Continue home medications: Wellbutrin, Klonopin, Cymbalta  CAD: no CP -continue prn NTG -continue Ranolazine  BPH: stable - Continue Flomax  HLD: Last LDL was 59 on 03/23/16 -Continue home medications: Lipitor  Mild AKI: Cre 1.21. Likely due to prerenal secondary to dehydration - Check FeNa  - Follow up renal function by BMP - Encourage good hydration  Leg edema: Patient has bilateral leg edema. BNP is elevated 204.9. Vascular congestion on chest x-ray, indicating possible CHF. He had 2d echo on 02/22/13 showed EF of 55%. -will get 2d echo -will not start diuretics now due to AKI   DVT ppx: on xarelto Code Status: Full code Family Communication: Yes, patient's "spouse" at bed side Disposition Plan:  Anticipate discharge back to previous home environment Consults called:  none Admission status: Inpatient/tele    Date of Service 11/03/2016    Ivor Costa [facility] Pager [phone]  If 7PM-7AM, please contact night-coverage [email or web address] Password [facility] 11/03/2016, 12:41 AM

## 2016-11-03 NOTE — Progress Notes (Signed)
[facility] Progress note  [name] is a 71 y.o. male with medical history significant of hypertension, hyperlipidemia, GERD, hypothyroidism, depression, anxiety, psoriasis on Enbrel, COPD, BPH, atrial fibrillation on Xarelto, who presents with increased urinary frequency, burning on urination, generalized weakness, fall.    Hospital Problems:  UTI (urinary tract infection) in immunocompromised adult - Patient has increased urinary frequency, burning on urination and hematuria, plus positive urinalysis, consistent with UTI. - Rocephin - check post void residual in AM  Fall - PT eval  Atrial Fibrillation: CHA2DS2-VASc Score is 3, needs oral anticoagulation. Patient is Xarelto at home. Heart rate is well controlled. -continue Xarelto, amiodarone and Cardizem  HTN: -On Cardizem  Psoriasis: -currently on Enbrel injections  LE edema - 2 D ECHO ordered  Debbe Odea, MD

## 2016-11-03 NOTE — ED Notes (Signed)
Physical therapy at bedside

## 2016-11-03 NOTE — Progress Notes (Signed)
  Echocardiogram 2D Echocardiogram has been performed.  [name] 11/03/2016, 4:05 PM

## 2016-11-03 NOTE — ED Notes (Signed)
I have just given report to Shanon Brow, RN on [facility]; and swill transport shortly.

## 2016-11-04 ENCOUNTER — Ambulatory Visit (HOSPITAL_COMMUNITY): Payer: Medicare Other

## 2016-11-04 DIAGNOSIS — M7989 Other specified soft tissue disorders: Secondary | ICD-10-CM | POA: Diagnosis not present

## 2016-11-04 DIAGNOSIS — F419 Anxiety disorder, unspecified: Secondary | ICD-10-CM | POA: Diagnosis not present

## 2016-11-04 DIAGNOSIS — N3001 Acute cystitis with hematuria: Secondary | ICD-10-CM | POA: Diagnosis not present

## 2016-11-04 DIAGNOSIS — N179 Acute kidney failure, unspecified: Secondary | ICD-10-CM

## 2016-11-04 DIAGNOSIS — L409 Psoriasis, unspecified: Secondary | ICD-10-CM

## 2016-11-04 DIAGNOSIS — R5381 Other malaise: Secondary | ICD-10-CM

## 2016-11-04 DIAGNOSIS — I2510 Atherosclerotic heart disease of native coronary artery without angina pectoris: Secondary | ICD-10-CM

## 2016-11-04 DIAGNOSIS — I480 Paroxysmal atrial fibrillation: Secondary | ICD-10-CM

## 2016-11-04 DIAGNOSIS — I10 Essential (primary) hypertension: Secondary | ICD-10-CM

## 2016-11-04 DIAGNOSIS — W19XXXA Unspecified fall, initial encounter: Secondary | ICD-10-CM

## 2016-11-04 DIAGNOSIS — Z7901 Long term (current) use of anticoagulants: Secondary | ICD-10-CM | POA: Diagnosis not present

## 2016-11-04 DIAGNOSIS — K219 Gastro-esophageal reflux disease without esophagitis: Secondary | ICD-10-CM

## 2016-11-04 DIAGNOSIS — E039 Hypothyroidism, unspecified: Secondary | ICD-10-CM

## 2016-11-04 LAB — URINE CULTURE

## 2016-11-04 LAB — BASIC METABOLIC PANEL
Anion gap: 8 (ref 5–15)
BUN: 21 mg/dL — ABNORMAL HIGH (ref 6–20)
CHLORIDE: 101 mmol/L (ref 101–111)
CO2: 26 mmol/L (ref 22–32)
Calcium: 8.3 mg/dL — ABNORMAL LOW (ref 8.9–10.3)
Creatinine, Ser: 0.98 mg/dL (ref 0.61–1.24)
GFR calc Af Amer: >60 mL/min (ref >60)
GFR calc non Af Amer: >60 mL/min (ref >60)
Glucose, Bld: 115 mg/dL — ABNORMAL HIGH (ref 65–99)
POTASSIUM: 5 mmol/L (ref 3.5–5.1)
SODIUM: 135 mmol/L (ref 135–145)

## 2016-11-04 LAB — CBC
HEMATOCRIT: 37.3 % — AB (ref 39.0–52.0)
Hemoglobin: 12.7 g/dL — ABNORMAL LOW (ref 13.0–17.0)
MCH: 30.2 pg (ref 26.0–34.0)
MCHC: 34 g/dL (ref 30.0–36.0)
MCV: 88.8 fL (ref 78.0–100.0)
Platelets: 293 10*3/uL (ref 150–400)
RBC: 4.2 MIL/uL — ABNORMAL LOW (ref 4.22–5.81)
RDW: 16.1 % — AB (ref 11.5–15.5)
WBC: 9.7 10*3/uL (ref 4.0–10.5)

## 2016-11-04 NOTE — Progress Notes (Signed)
Spoke with patient at bedside. States husband usually makes all his decisions about disposition. Patient states at baseline can walk several blocks, today only able to walk 8 ft x 2 per PT note. Patient states he typically sits most of the time, standing causes severe back pain after only a short time. LE are edematous and discolored. Patient was active with OP PT up until 2 weeks ago. Walks with a walker. Patient is interested in short term rehab, has been to [facility] recently but does not want to return there. Consulted CSW to initiate SNF search.

## 2016-11-04 NOTE — Progress Notes (Signed)
*  Preliminary Results* Left lower extremity venous duplex completed. Left lower extremity is negative for deep vein thrombosis. There is no evidence of left Baker's cyst.  11/04/2016 11:12 AM  Maudry Mayhew, BS, RVT, RDCS, RDMS

## 2016-11-04 NOTE — Care Management CC44 (Signed)
Condition Code 44 Documentation Completed  Patient Details  Name: MACRAE DEBAUN MRN: [id number] Date of Birth: [birth year]   Condition Code 44 given:  Yes Patient signature on Condition Code 44 notice:  Yes Documentation of 2 MD's agreement:  Yes Code 44 added to claim:  Yes    Guadalupe Maple, RN 11/04/2016, 3:40 PM

## 2016-11-04 NOTE — Progress Notes (Signed)
Physical Therapy Treatment Patient Details Name: JOSEL SHANK MRN: [id number] DOB: 01/23/1946 Today's Date: 11/04/2016    History of Present Illness Pt admitted through ED s/p fall with confusion and UTI.  Pt with hx of R THR with multiple revisions, bilat wrist surgery and bil TKR    PT Comments    amb distance/tolerance limited by 9/10 L medial calf pain with difficulty tolerating FWB.  Noted warm to touch and circumference of calf notably larger that R.  Pt states this is new.  Reported to RN.  Follow Up Recommendations  Home health PT     Equipment Recommendations       Recommendations for Other Services       Precautions / Restrictions Precautions Precautions: Fall Restrictions Weight Bearing Restrictions: No    Mobility  Bed Mobility Overal bed mobility: Needs Assistance         Sit to supine: Max assist;Mod assist   General bed mobility comments: assist B LE's up onto bed  Transfers Overall transfer level: Needs assistance Equipment used: Rolling walker (2 wheeled) Transfers: Sit to/from Stand Sit to Stand: Min guard         General transfer comment: increased time to power self up.  Good use of hands and safety awareness  Ambulation/Gait Ambulation/Gait assistance: Min guard Ambulation Distance (Feet): 16 Feet (8 feet x 2) Assistive device: Rolling walker (2 wheeled) Gait Pattern/deviations: Decreased stance time - left     General Gait Details: very limited amb distance due to MAX c/o L medial calf pain (new).   Noted increased RR and HR to 126 due to "the pain" stated pt.     Stairs            Wheelchair Mobility    Modified Rankin (Stroke Patients Only)       Balance                                    Cognition Arousal/Alertness: Awake/alert Behavior During Therapy: WFL for tasks assessed/performed Overall Cognitive Status: Within Functional Limits for tasks assessed                 General  Comments: pleasant    Exercises      General Comments        Pertinent Vitals/Pain Pain Assessment: 0-10 Pain Score: 8  Pain Location: L medial calf (warm to touch and calf circumference > than R) decreased WBing on L (limping/favoring) Pain Descriptors / Indicators: Constant;Cramping Pain Intervention(s): Monitored during session    Home Living                      Prior Function            PT Goals (current goals can now be found in the care plan section) Progress towards PT goals: Progressing toward goals    Frequency    Min 3X/week      PT Plan Current plan remains appropriate    Co-evaluation             End of Session Equipment Utilized During Treatment: Gait belt Activity Tolerance: Patient limited by pain Patient left: in bed;with call bell/phone within reach     Time: [id number] PT Time Calculation (min) (ACUTE ONLY): 24 min  Charges:  $Gait Training: 8-22 mins $Therapeutic Exercise: 8-22 mins  G Codes:      Rica Koyanagi  PTA [facility]  Acute  Rehab Pager      [phone]

## 2016-11-04 NOTE — Progress Notes (Addendum)
PROGRESS NOTE    AMARTYA ASLINGER   [id number]  DOB: 14-Nov-1945  DOA: 11/02/2016 PCP: Janie Morning, DO   Brief Narrative:  Javaughn Gerren Bullockis a 71 y.o.malewith medical history significant of hypertension, hyperlipidemia, GERD, hypothyroidism, depression, anxiety, psoriasis on Enbrel, COPD, BPH, atrial fibrillation on Xarelto, who presents with increased urinary frequency, burning on urination, generalized weakness & falls. Found to have a UTI.    Subjective: No complaints today. Is not aware how long his legs have been swollen. Urinary burning has resolved.   Assessment & Plan: UTI (urinary tract infection) in immunocompromised adult -Patient has increased urinary frequency, burning on urination and hematuria, plus positive urinalysis, consistentwith UTI. - cont Rocephin- U cultue has grown multiple species and is unhelpful.   Acute encephalopathy - has resolved- likely due to above  Fall - PT eval  Atrial Fibrillation: CHA2DS2-VASc Scoreis 3, needs oral anticoagulation. Patient is Xarelto at home. Heart rate is well controlled. -continue Xarelto, amiodarone and Cardizem  HTN: -On Cardizem  Psoriasis: -currently on Enbrel injections  LE edema - 2 D ECHO shows normal EF, bilateral atrial enlargement and dilated IVC - severe unilateral edema in left leg noted today- no DVT per duplex    DVT prophylaxis: xarelto Code Status: full code Family Communication:  Disposition Plan: possibly SNF Consultants:    Procedures:    Antimicrobials:  Anti-infectives    Start     Dose/Rate Route Frequency Ordered Stop   11/03/16 2200  cefTRIAXone (ROCEPHIN) 1 g in dextrose 5 % 50 mL IVPB     1 g 100 mL/hr over 30 Minutes Intravenous Daily at bedtime 11/03/16 0025     11/03/16 1000  doxycycline (VIBRA-TABS) tablet 100 mg     100 mg Oral Daily 11/03/16 0025     11/03/16 0000  cefTRIAXone (ROCEPHIN) 1 g in dextrose 5 % 50 mL IVPB     1 g 100 mL/hr over 30  Minutes Intravenous  Once 11/02/16 2355 11/03/16 0100       Objective: Vitals:   11/03/16 1521 11/03/16 2019 11/04/16 0452 11/04/16 1317  BP: (!) 144/75 (!) 162/73 (!) 152/80 (!) 142/68  Pulse: 97 (!) 109 (!) 106 95  Resp: 20 20 (!) 22 20  Temp: 97.3 F (36.3 C) 98.3 F (36.8 C) 97.6 F (36.4 C) 98.3 F (36.8 C)  TempSrc: Oral Oral Oral Oral  SpO2: 98% 95% 95% 93%    Intake/Output Summary (Last 24 hours) at 11/04/16 1430 Last data filed at 11/04/16 0936  Gross per 24 hour  Intake              530 ml  Output              600 ml  Net              -70 ml   There were no vitals filed for this visit.  Examination: General exam: Appears comfortable  HEENT: PERRLA, oral mucosa moist, no sclera icterus or thrush Respiratory system: Clear to auscultation. Respiratory effort normal. Cardiovascular system: S1 & S2 heard, RRR.  No murmurs  Gastrointestinal system: Abdomen soft, non-tender, nondistended. Normal bowel sound. No organomegaly Central nervous system: Alert and oriented. No focal neurological deficits. Extremities: No cyanosis, clubbing - significant left leg edema Skin: No rashes or ulcers Psychiatry:  Mood & affect appropriate.     Data Reviewed: I have personally reviewed following labs and imaging studies  CBC:  Recent Labs Lab 11/02/16 2244 11/03/16 1622 11/04/16  0602  WBC 9.2 9.7 9.7  NEUTROABS 7.1  --   --   HGB 11.7* 12.8* 12.7*  HCT 34.7* 37.9* 37.3*  MCV 88.3 88.3 88.8  PLT 257 273 0000000   Basic Metabolic Panel:  Recent Labs Lab 11/02/16 2244 11/03/16 1622 11/04/16 0602  NA 136 134* 135  K 3.7 3.5 5.0  CL 102 100* 101  CO2 25 26 26   GLUCOSE 113* 121* 115*  BUN 27* 23* 21*  CREATININE 1.21 1.12 0.98  CALCIUM 8.3* 8.3* 8.3*   GFR: Estimated Creatinine Clearance: 102.8 mL/min (by C-G formula based on SCr of 0.98 mg/dL). Liver Function Tests:  Recent Labs Lab 11/02/16 2244  AST 45*  ALT 40  ALKPHOS 86  BILITOT 1.5*  PROT 7.2    ALBUMIN 2.8*   No results for input(s): LIPASE, AMYLASE in the last 168 hours. No results for input(s): AMMONIA in the last 168 hours. Coagulation Profile:  Recent Labs Lab 11/02/16 2244  INR 2.45   Cardiac Enzymes:  Recent Labs Lab 11/02/16 2246  CKTOTAL 199   BNP (last 3 results) No results for input(s): PROBNP in the last 8760 hours. HbA1C: No results for input(s): HGBA1C in the last 72 hours. CBG: No results for input(s): GLUCAP in the last 168 hours. Lipid Profile: No results for input(s): CHOL, HDL, LDLCALC, TRIG, CHOLHDL, LDLDIRECT in the last 72 hours. Thyroid Function Tests: No results for input(s): TSH, T4TOTAL, FREET4, T3FREE, THYROIDAB in the last 72 hours. Anemia Panel: No results for input(s): VITAMINB12, FOLATE, FERRITIN, TIBC, IRON, RETICCTPCT in the last 72 hours. Urine analysis:    Component Value Date/Time   COLORURINE AMBER (A) 11/02/2016 2236   APPEARANCEUR CLOUDY (A) 11/02/2016 2236   LABSPEC 1.017 11/02/2016 2236   PHURINE 7.0 11/02/2016 2236   GLUCOSEU NEGATIVE 11/02/2016 2236   HGBUR LARGE (A) 11/02/2016 2236   BILIRUBINUR NEGATIVE 11/02/2016 [place] 11/02/2016 2236   PROTEINUR 100 (A) 11/02/2016 2236   UROBILINOGEN 1.0 10/24/2013 1112   NITRITE NEGATIVE 11/02/2016 2236   LEUKOCYTESUR LARGE (A) 11/02/2016 2236   Sepsis Labs: @LABRCNTIP (procalcitonin:4,lacticidven:4) ) Recent Results (from the past 240 hour(s))  Urine culture     Status: Abnormal   Collection Time: 11/02/16 10:40 PM  Result Value Ref Range Status   Specimen Description URINE, CLEAN CATCH  Final   Special Requests NONE  Final   Culture MULTIPLE SPECIES PRESENT, SUGGEST RECOLLECTION (A)  Final   Report Status 11/04/2016 FINAL  Final         Radiology Studies: Dg Chest 2 View  Result Date: 11/02/2016 CLINICAL DATA:  Status post fall, with worsening weakness, subacute onset. Initial encounter. EXAM: CHEST  2 VIEW COMPARISON:  Chest radiograph  from 02/19/2016 FINDINGS: The lungs are well-aerated. Vascular congestion is noted. There is no evidence of focal opacification, pleural effusion or pneumothorax. The heart is normal in size; the mediastinal contour is within normal limits. No acute osseous abnormalities are seen. IMPRESSION: Vascular congestion noted. Lungs remain grossly clear. No displaced rib fracture seen. Electronically Signed   By: Garald Balding M.D.   On: 11/02/2016 22:32   Ct Head Wo Contrast  Result Date: 11/02/2016 CLINICAL DATA:  71 year old male fell while trying to sit up and chair. Progressive weakness over the past 2 weeks. On blood thinner. Did not hit head. Initial encounter. EXAM: CT HEAD WITHOUT CONTRAST TECHNIQUE: Contiguous axial images were obtained from the base of the skull through the vertex without intravenous contrast. COMPARISON:  02/19/2016. FINDINGS: Brain: No intracranial hemorrhage or CT evidence of large acute infarct. Chronic microvascular changes. Global atrophy without hydrocephalus. No intracranial mass lesion noted on this unenhanced exam. Vascular: Vascular calcifications. Skull: No skull fracture. Sinuses/Orbits: No acute orbital abnormality. Visualized sinuses are clear. Other: Negative. IMPRESSION: No acute intracranial abnormality. Atrophy. Electronically Signed   By: Genia Del M.D.   On: 11/02/2016 22:25      Scheduled Meds: . acidophilus  1 capsule Oral Q breakfast  . amiodarone  200 mg Oral Daily  . atorvastatin  80 mg Oral QPM  . buPROPion  300 mg Oral Q breakfast  . calcium carbonate  1,250 mg Oral BID  . cefTRIAXone (ROCEPHIN)  IV  1 g Intravenous QHS  . cholecalciferol  4,000 Units Oral Daily  . clonazePAM  1 mg Oral Q6H  . dextromethorphan-guaiFENesin  1 tablet Oral BID  . diltiazem  240 mg Oral q morning - 10a  . doxycycline  100 mg Oral Daily  . DULoxetine  60 mg Oral BID  . ferrous sulfate  325 mg Oral Daily  . HYDROcodone-acetaminophen  1 tablet Oral Q6H  .  levothyroxine  200 mcg Oral QAC breakfast  . loratadine  10 mg Oral Daily  . multivitamin with minerals  1 tablet Oral Daily  . pantoprazole  40 mg Oral Daily  . ranolazine  500 mg Oral BID  . rivaroxaban  20 mg Oral Q supper  . sodium chloride flush  3 mL Intravenous Q12H  . tamsulosin  0.4 mg Oral QPM  . vitamin C  1,000 mg Oral Daily   Continuous Infusions:   LOS: 1 day    Time spent in minutes: 38    [place], MD [facility] Pager: [email or web address] Password [email or web address] 11/04/2016, 2:30 PM

## 2016-11-04 NOTE — Progress Notes (Signed)
OT Cancellation Note  Patient Details Name: [name] MRN: [id number] DOB: 1946/05/09   Cancelled Treatment:    Reason Eval/Treat Not Completed: Fatigue/lethargy limiting ability to participate.  Pt very tired when I checked on him at 1220.  He reports he worked with PT earlier. Will reattempt tomorrow.  Saraiya Kozma 11/04/2016, 1:39 PM  Lesle Chris, OTR/L [phone] 11/04/2016

## 2016-11-04 NOTE — Care Management Obs Status (Signed)
[facility] NOTIFICATION   Patient Details  Name: [name] MRN: [id number] Date of Birth: April 05, 1946   Medicare Observation Status Notification Given:  Yes    Guadalupe Maple, RN 11/04/2016, 3:40 PM

## 2016-11-05 ENCOUNTER — Observation Stay (HOSPITAL_COMMUNITY): Payer: Medicare Other

## 2016-11-05 ENCOUNTER — Encounter (HOSPITAL_COMMUNITY): Payer: Self-pay | Admitting: Radiology

## 2016-11-05 DIAGNOSIS — N200 Calculus of kidney: Secondary | ICD-10-CM

## 2016-11-05 DIAGNOSIS — F419 Anxiety disorder, unspecified: Secondary | ICD-10-CM

## 2016-11-05 DIAGNOSIS — I2510 Atherosclerotic heart disease of native coronary artery without angina pectoris: Secondary | ICD-10-CM | POA: Diagnosis not present

## 2016-11-05 DIAGNOSIS — N3001 Acute cystitis with hematuria: Secondary | ICD-10-CM | POA: Diagnosis not present

## 2016-11-05 DIAGNOSIS — N179 Acute kidney failure, unspecified: Secondary | ICD-10-CM | POA: Diagnosis not present

## 2016-11-05 LAB — BASIC METABOLIC PANEL
ANION GAP: 7 (ref 5–15)
BUN: 22 mg/dL — AB (ref 6–20)
CO2: 27 mmol/L (ref 22–32)
Calcium: 8.6 mg/dL — ABNORMAL LOW (ref 8.9–10.3)
Chloride: 103 mmol/L (ref 101–111)
Creatinine, Ser: 1.05 mg/dL (ref 0.61–1.24)
GFR calc Af Amer: >60 mL/min (ref >60)
GLUCOSE: 113 mg/dL — AB (ref 65–99)
POTASSIUM: 3.7 mmol/L (ref 3.5–5.1)
Sodium: 137 mmol/L (ref 135–145)

## 2016-11-05 LAB — CBC
HEMATOCRIT: 36.2 % — AB (ref 39.0–52.0)
HEMOGLOBIN: 12 g/dL — AB (ref 13.0–17.0)
MCH: 29.3 pg (ref 26.0–34.0)
MCHC: 33.1 g/dL (ref 30.0–36.0)
MCV: 88.5 fL (ref 78.0–100.0)
Platelets: 296 10*3/uL (ref 150–400)
RBC: 4.09 MIL/uL — ABNORMAL LOW (ref 4.22–5.81)
RDW: 15.9 % — AB (ref 11.5–15.5)
WBC: 8 10*3/uL (ref 4.0–10.5)

## 2016-11-05 MED ORDER — HYDROCODONE-ACETAMINOPHEN 7.5-325 MG PO TABS: 1.0000 | ORAL_TABLET | Freq: Four times a day (QID) | ORAL | 0 refills | Status: DC

## 2016-11-05 MED ORDER — CIPROFLOXACIN HCL 500 MG PO TABS: 500.0000 mg | ORAL_TABLET | Freq: Two times a day (BID) | ORAL | 0 refills | Status: AC

## 2016-11-05 MED ORDER — IOPAMIDOL (ISOVUE-300) INJECTION 61%
INTRAVENOUS | Status: AC
  Administered 2016-11-05: 100 mL
  Filled 2016-11-05: qty 100

## 2016-11-05 NOTE — Clinical Social Work Placement (Signed)
   CLINICAL SOCIAL WORK PLACEMENT  NOTE  Date:  11/05/2016  Patient Details  Name: [name] MRN: [id number] Date of Birth: April 22, 1946  Clinical Social Work is seeking post-discharge placement for this patient at the [place] level of care (*CSW will initial, date and re-position this form in  chart as items are completed):  Yes   Patient/family provided with [facility] Work Department's list of facilities offering this level of care within the geographic area requested by the patient (or if unable, by the patient's family).  Yes   Patient/family informed of their freedom to choose among providers that offer the needed level of care, that participate in Medicare, Medicaid or managed care program needed by the patient, have an available bed and are willing to accept the patient.  Yes   Patient/family informed of [facility]'s ownership interest in [facility] and [facility], as well as of the fact that they are under no obligation to receive care at these facilities.  PASRR submitted to EDS on       PASRR number received on       Existing PASRR number confirmed on 11/05/16     FL2 transmitted to all facilities in geographic area requested by pt/family on 11/05/16     FL2 transmitted to all facilities within larger geographic area on       Patient informed that his/her managed care company has contracts with or will negotiate with certain facilities, including the following:        Yes   Patient/family informed of bed offers received.  Patient chooses bed at [facility]     Physician recommends and patient chooses bed at [facility]    Patient to be transferred to [facility] on 11/05/16.  Patient to be transferred to facility by EMS [facility]AR     Patient family notified on 11/05/16 of transfer.  Name of family member notified:  [name]  Husband     PHYSICIAN Please sign FL2     Additional Comment:    _______________________________________________ Lilly [place], LCSW 11/05/2016, 3:32 PM

## 2016-11-05 NOTE — NC FL2 (Signed)
[place] LEVEL OF CARE SCREENING TOOL     IDENTIFICATION  Patient Name: [name] Birthdate: 14-Jun-1946 Sex: male Admission Date (Current Location): 11/02/2016  [facility] and [place] Number:  Herbalist and Address:  [facility],  [place] [address] [place]      Provider Number: [id number]  Attending Physician Name and Address:  Debbe Odea, MD  Relative Name and Phone Number:       Current Level of Care: Hospital Recommended Level of Care: [place] Prior Approval Number:    Date Approved/Denied:   PASRR Number:   [id number]  Discharge Plan: SNF    Current Diagnoses: Patient Active Problem List   Diagnosis Date Noted  . AKI (acute kidney injury) ([place]) 11/03/2016  . Hyperlipidemia 03/22/2016  . Wound drainage right hip 11/15/2013  . Expected blood loss anemia 09/18/2013  . Hyponatremia 09/18/2013  . S/P right TH revision 09/17/2013  . Nonunion, fracture, Right tibia  07/03/2013  . UTI (urinary tract infection) 07/03/2013  . Pathological fracture due to osteoporosis with delayed healing 04/24/2013  . Osteoporosis with fracture 04/24/2013  . Rash and nonspecific skin eruption 03/08/2013  . Syncope 02/21/2013  . PAF (paroxysmal atrial fibrillation) ([place]) 02/21/2013  . Chronic anticoagulation 02/21/2013  . Fall 02/08/2013  . Physical deconditioning 02/08/2013  . Low back pain radiating to both legs 02/08/2013  . Compression fracture of L1 lumbar vertebra (H[facility]) 02/08/2013  . Right tibial fracture 02/05/2013  . Obesity, unspecified 02/05/2013  . Osteomyelitis of right wrist ([place]) 11/02/2012  . Anemia 10/06/2012  . HTN (hypertension) 10/03/2012  . Psoriasis 10/03/2012  . Dyslipidemia 10/03/2012  . Vitamin D deficiency 10/03/2012  . GERD (gastroesophageal reflux disease) 10/03/2012  . Hyperglycemia 10/03/2012  . Anxiety 10/03/2012  . Depression 10/03/2012  . CAD (coronary artery disease)  10/03/2012  . DJD (degenerative joint disease) 10/03/2012  . Chronic gingivitis 10/03/2012  . Low testosterone, possible hypogonadism 10/01/2012  . Normocytic anemia 09/30/2012  . Right femoral fracture ([place]) 09/29/2012  . Right forearm fracture 09/29/2012  . Atrial fibrillation ([place]) 09/29/2012  . Hypothyroidism 09/29/2012  . History of recurrent UTIs 09/29/2012    Orientation RESPIRATION BLADDER Height & Weight     Self, Time, Situation, Place  Normal Continent Weight:   Height:     BEHAVIORAL SYMPTOMS/MOOD NEUROLOGICAL BOWEL NUTRITION STATUS      Continent Diet (regular)  AMBULATORY STATUS COMMUNICATION OF NEEDS Skin   Limited Assist Verbally Normal                       Personal Care Assistance Level of Assistance  Bathing, Feeding, Dressing Bathing Assistance: Limited assistance Feeding assistance: Limited assistance Dressing Assistance: Limited assistance     Functional Limitations Info  Sight, Hearing, Speech Sight Info: Adequate Hearing Info: Adequate Speech Info: Adequate    SPECIAL CARE FACTORS FREQUENCY  PT (By licensed PT), OT (By licensed OT)     PT Frequency: 5x OT Frequency: 5x            Contractures Contractures Info: Not present    Additional Factors Info  Code Status, Allergies Code Status Info: Full Allergies Info: NKA           Current Medications (11/05/2016):  This is the current hospital active medication list Current Facility-Administered Medications  Medication Dose Route Frequency Provider Last Rate Last Dose  . acetaminophen (TYLENOL) tablet 650 mg  650 mg Oral Q6H PRN Soledad Gerlach  Blaine Hamper, MD       Or  . acetaminophen (TYLENOL) suppository 650 mg  650 mg Rectal Q6H PRN Ivor Costa, MD      . acidophilus (RISAQUAD) capsule 1 capsule  1 capsule Oral Q breakfast Ivor Costa, MD   1 capsule at 11/05/16 0820  . albuterol (PROVENTIL) (2.5 MG/3ML) 0.083% nebulizer solution 2.5 mg  2.5 mg Nebulization Q4H PRN Ivor Costa, MD      . amiodarone  (PACERONE) tablet 200 mg  200 mg Oral Daily Ivor Costa, MD   200 mg at 11/05/16 1018  . atorvastatin (LIPITOR) tablet 80 mg  80 mg Oral QPM Ivor Costa, MD   80 mg at 11/04/16 1717  . buPROPion (WELLBUTRIN XL) 24 hr tablet 300 mg  300 mg Oral Q breakfast Ivor Costa, MD   300 mg at 11/05/16 0820  . calcium carbonate (OS-CAL - dosed in mg of elemental calcium) tablet 1,250 mg  1,250 mg Oral BID Ivor Costa, MD   1,250 mg at 11/05/16 1018  . cefTRIAXone (ROCEPHIN) 1 g in dextrose 5 % 50 mL IVPB  1 g Intravenous QHS Ivor Costa, MD   1 g at 11/04/16 2129  . cholecalciferol (VITAMIN D) tablet 4,000 Units  4,000 Units Oral Daily Ivor Costa, MD   4,000 Units at 11/05/16 1017  . clonazePAM (KLONOPIN) tablet 1 mg  1 mg Oral Q6H Ivor Costa, MD   1 mg at 11/05/16 0600  . dextromethorphan-guaiFENesin (MUCINEX DM) 30-600 MG per 12 hr tablet 1 tablet  1 tablet Oral BID Ivor Costa, MD   1 tablet at 11/05/16 1018  . diltiazem (CARDIZEM CD) 24 hr capsule 240 mg  240 mg Oral q morning - 10a Ivor Costa, MD   240 mg at 11/05/16 1018  . doxycycline (VIBRA-TABS) tablet 100 mg  100 mg Oral Daily Ivor Costa, MD   100 mg at 11/05/16 1018  . DULoxetine (CYMBALTA) DR capsule 60 mg  60 mg Oral BID Ivor Costa, MD   60 mg at 11/05/16 1018  . ferrous sulfate tablet 325 mg  325 mg Oral Daily Ivor Costa, MD   325 mg at 11/05/16 1017  . HYDROcodone-acetaminophen (NORCO) 7.5-325 MG per tablet 1 tablet  1 tablet Oral Q6H Ivor Costa, MD   1 tablet at 11/05/16 0600  . levothyroxine (SYNTHROID, LEVOTHROID) tablet 200 mcg  200 mcg Oral QAC breakfast Ivor Costa, MD   200 mcg at 11/05/16 0820  . loratadine (CLARITIN) tablet 10 mg  10 mg Oral Daily Ivor Costa, MD   10 mg at 11/05/16 1018  . multivitamin with minerals tablet 1 tablet  1 tablet Oral Daily Ivor Costa, MD   1 tablet at 11/05/16 1018  . nitroGLYCERIN (NITROSTAT) SL tablet 0.4 mg  0.4 mg Sublingual Q5 min PRN Ivor Costa, MD      . ondansetron [facility]) tablet 4 mg  4 mg Oral Q6H PRN Ivor Costa, MD        Or  . ondansetron [facility]) injection 4 mg  4 mg Intravenous Q6H PRN Ivor Costa, MD      . pantoprazole (PROTONIX) EC tablet 40 mg  40 mg Oral Daily Ivor Costa, MD   40 mg at 11/05/16 1020  . polyethylene glycol (MIRALAX / GLYCOLAX) packet 17 g  17 g Oral Daily PRN Ivor Costa, MD      . ranolazine (RANEXA) 12 hr tablet 500 mg  500 mg Oral BID Ivor Costa, MD   500 mg  at 11/05/16 1018  . rivaroxaban (XARELTO) tablet 20 mg  20 mg Oral Q supper Ivor Costa, MD   20 mg at 11/04/16 1718  . sodium chloride flush (NS) 0.9 % injection 3 mL  3 mL Intravenous Q12H Ivor Costa, MD   3 mL at 11/05/16 1020  . tamsulosin (FLOMAX) capsule 0.4 mg  0.4 mg Oral QPM Ivor Costa, MD   0.4 mg at 11/04/16 1718  . tiZANidine (ZANAFLEX) tablet 6 mg  6 mg Oral Q6H PRN Ivor Costa, MD      . vitamin C (ASCORBIC ACID) tablet 1,000 mg  1,000 mg Oral Daily Ivor Costa, MD   1,000 mg at 11/05/16 1018  . zolpidem (AMBIEN) tablet 5 mg  5 mg Oral QHS PRN Ivor Costa, MD   5 mg at 11/03/16 2148     Discharge Medications: Please see discharge summary for a list of discharge medications.  Relevant Imaging Results:  Relevant Lab Results:   Additional Information SSN:  [id number]  [place], [place]

## 2016-11-05 NOTE — Progress Notes (Signed)
Report called Zigmund Daniel at [facility].

## 2016-11-05 NOTE — Clinical Social Work Note (Signed)
Clinical Social Work Assessment  Patient Details  Name: [name] MRN: [id number] Date of Birth: 02/07/1946  Date of referral:  11/05/16               Reason for consult:  Facility Placement, [employer], Discharge Planning, Family Concerns                Permission sought to share information with:  Case Freight forwarder, Customer service manager, Family Supports Permission granted to share information::  Yes, Verbal Permission Granted  Name::        Agency::     Relationship::  [name] [phone]  Contact Information:     Housing/Transportation Living arrangements for the past 2 months:  [place] of Information:  Patient, Medical Team, Case Manager, Spouse Patient Interpreter Needed:  None Criminal Activity/Legal Involvement Pertinent to Current Situation/Hospitalization:  No - Comment as needed Significant Relationships:  Other Family Members, Spouse Lives with:  Spouse Do you feel safe going back to the place where you live?  Yes Need for family participation in patient care:  No (Coment)  Care giving concerns:  LCSW had a long conversation with patient and patient husband regarding care concerns related to discharge home vs discharge to SNF. [name] (husband of patient) has been sick himself  And cannot manage him in the home at this time.  He is understanding that patient is not going to qualify for SNF as his care is custodial vs Skilled.  [name] is aware and reports he knows patient can come home, but when he comes home [name] has to wait on him and do everything and he just does not want to do that.  He reports he would rather pay privately for SNF and let patient get help for a few weeks.   [name] reports patient's baseline is sitting in his recliner 20 hours a day. [name] reports this has been going on for the last 5 years and feels Antonios is depressed and afraid to be alone without his spouse.  Reports the two are in therapy (couples therapy)  with [facility].    Patient has been to SNF several times in the past 2015, 2017 [facility].  [name] does not want him to return to [facility], however open to [facility].     Social Worker assessment / plan:  LCSW has completed assessment of needs and complete referral for private pay at [facility].  SNF work up done.  Discharge is pending at this time.  Patient and husband would like SNF.  [facility] is reviewing referral and if they can accept patient.   Employment status:  Retired Nurse, adult PT Recommendations:  [place] / Referral to community resources:  [place]  Patient/Family's Response to care: Agreeable to plan  Patient/Family's Understanding of and Emotional Response to Diagnosis, Current Treatment, and Prognosis:  TBA.  Patient limited by his anxiety and worry vs what he is actually capable of doing.  Emotional Assessment Appearance:  Appears older than stated age, Disheveled Attitude/Demeanor/Rapport:  Attention Seeking Affect (typically observed):  Hopeless, Restless Orientation:  Oriented to Self, Oriented to Place, Oriented to Situation Alcohol / Substance use:  Not Applicable Psych involvement (Current and /or in the community):  Yes (Comment) (outpatient)  Discharge Needs  Concerns to be addressed:  Coping/Stress Concerns, Discharge Planning Concerns Readmission within the last 30 days:  No Current discharge risk:  Inadequate Financial Supports Barriers to Discharge:  Inadequate or  no insurance   [place], [place] 11/05/2016, 12:20 PM

## 2016-11-05 NOTE — Progress Notes (Signed)
PROGRESS NOTE    [name]   [id number]  DOB: 01-Sep-1946  DOA: 11/02/2016 PCP: Janie Morning, DO   Brief Narrative:  Aneesh Whittenberger Bullockis a 71 y.o.malewith medical history significant of hypertension, hyperlipidemia, GERD, hypothyroidism, depression, anxiety, psoriasis on Enbrel, COPD, BPH, atrial fibrillation on Xarelto, who presents with increased urinary frequency, burning on urination, generalized weakness & falls. Found to have a UTI.   Subjective: Left leg is quite heavy and difficult to lift with walking.  Assessment & Plan: UTI (urinary tract infection) in immunocompromised adult -Patient has increased urinary frequency, burning on urination and hematuria,  positive urinalysis, consistentwith UTI. - cont Rocephin- U cultue has grown multiple species and is unhelpful.  - large staghorn calculus noted on CT- will call urology  Acute encephalopathy - has resolved- likely due to above  Fall - PT eval  Atrial Fibrillation: CHA2DS2-VASc Scoreis 3, needs oral anticoagulation. Patient is Xarelto at home. Heart rate is well controlled. -continue Xarelto, amiodarone and Cardizem  HTN: -On Cardizem  Psoriasis: -currently on Enbrel injections  LE edema - 2 D ECHO shows normal EF, bilateral atrial enlargement and dilated IVC - severe unilateral edema in left leg noted today - no DVT per duplex - checked Ct Venogram abdomen/pelvis- negative   DVT prophylaxis: xarelto Code Status: full code Family Communication:  Disposition Plan: possibly SNF Consultants:    Procedures:    Antimicrobials:  Anti-infectives    Start     Dose/Rate Route Frequency Ordered Stop   11/03/16 2200  cefTRIAXone (ROCEPHIN) 1 g in dextrose 5 % 50 mL IVPB     1 g 100 mL/hr over 30 Minutes Intravenous Daily at bedtime 11/03/16 0025     11/03/16 1000  doxycycline (VIBRA-TABS) tablet 100 mg     100 mg Oral Daily 11/03/16 0025     11/03/16 0000  cefTRIAXone (ROCEPHIN) 1 g in  dextrose 5 % 50 mL IVPB     1 g 100 mL/hr over 30 Minutes Intravenous  Once 11/02/16 2355 11/03/16 0100       Objective: Vitals:   11/04/16 0452 11/04/16 1317 11/04/16 2208 11/05/16 0558  BP: (!) 152/80 (!) 142/68 (!) 141/75 (!) 159/68  Pulse: (!) 106 95 (!) 111 (!) 107  Resp: (!) 22 20 20 20   Temp: 97.6 F (36.4 C) 98.3 F (36.8 C) 98.7 F (37.1 C) 98.7 F (37.1 C)  TempSrc: Oral Oral Oral Oral  SpO2: 95% 93% 94% 92%    Intake/Output Summary (Last 24 hours) at 11/05/16 1259 Last data filed at 11/05/16 1002  Gross per 24 hour  Intake              290 ml  Output                0 ml  Net              290 ml   There were no vitals filed for this visit.  Examination: General exam: Appears comfortable  HEENT: PERRLA, oral mucosa moist, no sclera icterus or thrush Respiratory system: Clear to auscultation. Respiratory effort normal. Cardiovascular system: S1 & S2 heard, RRR.  No murmurs  Gastrointestinal system: Abdomen soft, non-tender, nondistended. Normal bowel sound. No organomegaly Central nervous system: Alert and oriented. No focal neurological deficits. Extremities: No cyanosis, clubbing - significant left leg edema Skin: No rashes or ulcers Psychiatry:  Mood & affect appropriate.     Data Reviewed: I have personally reviewed following labs and imaging studies  CBC:  Recent Labs Lab 11/02/16 2244 11/03/16 1622 11/04/16 0602 11/05/16 0618  WBC 9.2 9.7 9.7 8.0  NEUTROABS 7.1  --   --   --   HGB 11.7* 12.8* 12.7* 12.0*  HCT 34.7* 37.9* 37.3* 36.2*  MCV 88.3 88.3 88.8 88.5  PLT 257 273 293 0000000   Basic Metabolic Panel:  Recent Labs Lab 11/02/16 2244 11/03/16 1622 11/04/16 0602 11/05/16 0618  NA 136 134* 135 137  K 3.7 3.5 5.0 3.7  CL 102 100* 101 103  CO2 25 26 26 27   GLUCOSE 113* 121* 115* 113*  BUN 27* 23* 21* 22*  CREATININE 1.21 1.12 0.98 1.05  CALCIUM 8.3* 8.3* 8.3* 8.6*   GFR: Estimated Creatinine Clearance: 95.9 mL/min (by C-G formula  based on SCr of 1.05 mg/dL). Liver Function Tests:  Recent Labs Lab 11/02/16 2244  AST 45*  ALT 40  ALKPHOS 86  BILITOT 1.5*  PROT 7.2  ALBUMIN 2.8*   No results for input(s): LIPASE, AMYLASE in the last 168 hours. No results for input(s): AMMONIA in the last 168 hours. Coagulation Profile:  Recent Labs Lab 11/02/16 2244  INR 2.45   Cardiac Enzymes:  Recent Labs Lab 11/02/16 2246  CKTOTAL 199   BNP (last 3 results) No results for input(s): PROBNP in the last 8760 hours. HbA1C: No results for input(s): HGBA1C in the last 72 hours. CBG: No results for input(s): GLUCAP in the last 168 hours. Lipid Profile: No results for input(s): CHOL, HDL, LDLCALC, TRIG, CHOLHDL, LDLDIRECT in the last 72 hours. Thyroid Function Tests: No results for input(s): TSH, T4TOTAL, FREET4, T3FREE, THYROIDAB in the last 72 hours. Anemia Panel: No results for input(s): VITAMINB12, FOLATE, FERRITIN, TIBC, IRON, RETICCTPCT in the last 72 hours. Urine analysis:    Component Value Date/Time   COLORURINE AMBER (A) 11/02/2016 2236   APPEARANCEUR CLOUDY (A) 11/02/2016 2236   LABSPEC 1.017 11/02/2016 2236   PHURINE 7.0 11/02/2016 2236   GLUCOSEU NEGATIVE 11/02/2016 2236   HGBUR LARGE (A) 11/02/2016 2236   BILIRUBINUR NEGATIVE 11/02/2016 2236   KETONESUR NEGATIVE 11/02/2016 2236   PROTEINUR 100 (A) 11/02/2016 2236   UROBILINOGEN 1.0 10/24/2013 1112   NITRITE NEGATIVE 11/02/2016 2236   LEUKOCYTESUR LARGE (A) 11/02/2016 2236   Sepsis Labs: @LABRCNTIP (procalcitonin:4,lacticidven:4) ) Recent Results (from the past 240 hour(s))  Urine culture     Status: Abnormal   Collection Time: 11/02/16 10:40 PM  Result Value Ref Range Status   Specimen Description URINE, CLEAN CATCH  Final   Special Requests NONE  Final   Culture MULTIPLE SPECIES PRESENT, SUGGEST RECOLLECTION (A)  Final   Report Status 11/04/2016 FINAL  Final         Radiology Studies: Ct Venogram Abd/pel  Result Date:  11/05/2016 CLINICAL DATA:  History of hypertension, hyperlipidemia, and GERD, psoriasis, atrial fibrillation, now with urinary frequency and burning on urination. Found to have a UTI. EXAM: CT ABDOMEN AND PELVIS WITH CONTRAST TECHNIQUE: Multidetector CT imaging of the abdomen and pelvis was performed using the standard protocol following bolus administration of intravenous contrast. CONTRAST:  100 ISOVUE-300 IOPAMIDOL (ISOVUE-300) INJECTION 61% COMPARISON:  Lumbar spine CT - 08/06/2013 FINDINGS: Lower chest: Limited visualization of the lower thorax is negative for focal airspace opacity or pleural effusion. Minimal subsegmental atelectasis within the medial basilar segment of the right lower lobe. Hepatobiliary: Normal hepatic contour. No discrete hepatic lesions. No radiopaque gallstones. No intra extrahepatic biliary ductal dilatation. No ascites. Pancreas: Normal appearance of the pancreas Spleen: Normal  appearance of the spleen. Adrenals/Urinary Tract: There is symmetric enhancement of the bilateral kidneys. There is a large (at least 5.4 cm) staghorn calculus involving near the entirely of the right renal collecting system and right renal pelvis. Note is made of at least 2 punctate nonobstructing left-sided renal stones with dominant nonobstructing stone within the anterior interpolar aspect the left kidney measuring 4 mm in diameter (image 32, series 3). Punctate (approximately 1 cm) hypoattenuating left-sided renal cyst. Additional bilateral subcentimeter hypoattenuating lesions are too small to accurately characterize though favored to represent additional renal cysts. Minimal amount of grossly symmetric bilateral perinephric stranding. No urinary obstruction. Normal appearance of the urinary bladder given degree distention though evaluation degraded secondary streak artifact from the patient's right total hip prosthesis. There is an approximately 1.4 cm nodule within the lateral limb of the right adrenal  gland which is incompletely characterize though appears morphologically similar to remote lumbar spine CT performed 07/2013 favored to represent a benign adrenal adenoma. No discrete left-sided adrenal lesions. Stomach/Bowel: Moderate to large colonic stool burden without evidence of enteric obstruction. The bowel is normal in course and caliber without wall thickening or evidence of enteric obstruction. Normal appearance of the terminal ileum and appendix. No pneumoperitoneum, pneumatosis or portal venous gas. Vascular/Lymphatic: Moderate to large amount of slightly irregular mixed calcified and noncalcified atherosclerotic plaque with a normal caliber abdominal aorta. The IVC and pelvic venous system appears widely patent without perivascular stranding. Scattered retroperitoneal lymph nodes are numerous though individually not enlarged by size criteria. No bulky retroperitoneal, mesenteric, pelvic or inguinal lymphadenopathy. Reproductive: Suboptimally evaluated secondary streak artifact from the patient's right total hip prosthesis. Other: No neck approximately 5.4 x 4.2 cm mesenteric fat containing periumbilical hernia (sagittal image 121, series 5). Musculoskeletal: Post suspected read to right total hip prosthesis. This finding is associated with asymmetric atrophy of the right pelvic and gluteal musculature. Old fractures involving the right seventh and tenth ribs. IMPRESSION: 1. Large (at least 5.4 cm) right-sided staghorn calculus involving nearly all of the right-sided renal calices and the entirety of the right renal pelvis. 2. Punctate (sub 4 mm) nonobstructing left-sided renal stones. 3. The IVC and pelvic venous system appears widely patent. 4.  Aortic Atherosclerosis (ICD10-170.0) Electronically Signed   By: Sandi Mariscal M.D.   On: 11/05/2016 12:48      Scheduled Meds: . acidophilus  1 capsule Oral Q breakfast  . amiodarone  200 mg Oral Daily  . atorvastatin  80 mg Oral QPM  . buPROPion  300  mg Oral Q breakfast  . calcium carbonate  1,250 mg Oral BID  . cefTRIAXone (ROCEPHIN)  IV  1 g Intravenous QHS  . cholecalciferol  4,000 Units Oral Daily  . clonazePAM  1 mg Oral Q6H  . dextromethorphan-guaiFENesin  1 tablet Oral BID  . diltiazem  240 mg Oral q morning - 10a  . doxycycline  100 mg Oral Daily  . DULoxetine  60 mg Oral BID  . ferrous sulfate  325 mg Oral Daily  . HYDROcodone-acetaminophen  1 tablet Oral Q6H  . levothyroxine  200 mcg Oral QAC breakfast  . loratadine  10 mg Oral Daily  . multivitamin with minerals  1 tablet Oral Daily  . pantoprazole  40 mg Oral Daily  . ranolazine  500 mg Oral BID  . rivaroxaban  20 mg Oral Q supper  . sodium chloride flush  3 mL Intravenous Q12H  . tamsulosin  0.4 mg Oral QPM  . vitamin C  1,000 mg Oral Daily   Continuous Infusions:   LOS: 1 day    Time spent in minutes: 48    [place], MD [facility] Pager: [email or web address] Password [facility] 11/05/2016, 12:59 PM

## 2016-11-05 NOTE — Discharge Summary (Signed)
Physician Discharge Summary  [name] [id number] DOB: [birth year] DOA: 11/02/2016  PCP: Janie Morning, DO  Admit date: 11/02/2016 Discharge date: 11/05/2016  Admitted From: home  Disposition:  SNF   Recommendations for Outpatient Follow-up:  1. F/u on staghorn calculus- need appt with [facility] 2. F/u for resolution of UTI- needs 8 more days of antibiotics- follow for dysuria, fevers Discharge Condition:  stable   CODE STATUS:  Full code   Diet recommendation:  Heart healthy Consultations:  Phone consult with Dr Allen Kell     Discharge Diagnoses:  Principal Problem:   UTI (urinary tract infection) Active Problems:   Staghorn renal calculus   Hypothyroidism   Low testosterone, possible hypogonadism   HTN (hypertension)   Psoriasis   GERD (gastroesophageal reflux disease)   Anxiety   Depression   CAD (coronary artery disease)   Fall   Physical deconditioning   PAF (paroxysmal atrial fibrillation) (HCC)   Chronic anticoagulation   Hyperlipidemia    Subjective: No complaints but unsteady on his feet.   Brief Summary: Joseph Newitt Bullockis a 71 y.o.malewith medical history significant of hypertension, hyperlipidemia, GERD, hypothyroidism, depression, anxiety, psoriasis on Enbrel, COPD, BPH, atrial fibrillation on Xarelto, who presents with increased urinary frequency, burning on urination, generalized weakness & falls. Found to have a UTI.   Hospital Course:  UTI (urinary tract infection) in immunocompromised adult -Patient has increased urinary frequency, burning on urination and hematuria,  positive urinalysis, consistentwith UTI. - was started on Rocephin- U cultue has grown multiple species and is unhelpful but his symptoms have resolved - large staghorn calculus noted on CT- have discussed with Dr Allen Kell who recommend outpt f/u in 1 wk and d/c on antibiotics for a total of 10 days. Will transition to Cipro for 8 more day.   Acute  encephalopathy - has resolved- likely due to above  LE edema - severe unilateral edema in left leg noted   - no DVT per duplex - checked Ct Venogram abdomen/pelvis- negative - 2 D ECHO shows normal EF, bilateral atrial enlargement and dilated IVC  Fall - PT eval  Atrial Fibrillation: CHA2DS2-VASc Scoreis 3, needs oral anticoagulation. Patient is Xarelto at home. Heart rate is well controlled. -continue Xarelto, amiodarone and Cardizem  HTN: -On Cardizem  Psoriasis: -currently on Enbrel injections  Discharge Instructions  Discharge Instructions    Diet - low sodium heart healthy    Complete by:  As directed    Increase activity slowly    Complete by:  As directed      Allergies as of 11/05/2016   No Known Allergies     Medication List    TAKE these medications   amiodarone 200 MG tablet Commonly known as:  PACERONE Take 200 mg by mouth daily.   atorvastatin 80 MG tablet Commonly known as:  LIPITOR Take 80 mg by mouth every evening.   buPROPion 300 MG 24 hr tablet Commonly known as:  WELLBUTRIN XL Take 300 mg by mouth daily with breakfast.   CALCIUM PO Take 400 mg by mouth 2 (two) times daily.   Cholecalciferol 4000 units Caps Take 4,000 Units by mouth daily.   ciprofloxacin 500 MG tablet Commonly known as:  CIPRO Take 1 tablet (500 mg total) by mouth 2 (two) times daily.   clonazePAM 1 MG tablet Commonly known as:  KLONOPIN Take 1 mg by mouth 4 (four) times daily.   denosumab 60 MG/ML Soln injection Commonly known as:  PROLIA Inject 60 mg into  the skin every 6 (six) months. Administer in upper arm, thigh, or abdomen   diltiazem 240 MG 24 hr capsule Commonly known as:  CARDIZEM CD Take 240 mg by mouth every morning.   doxycycline 100 MG tablet Commonly known as:  VIBRA-TABS Take 100 mg by mouth daily.   DULoxetine 60 MG capsule Commonly known as:  CYMBALTA Take 60 mg by mouth 2 (two) times daily.   ENBREL 50 MG/ML injection Generic  drug:  etanercept Inject 50 mg into the skin 2 (two) times a week. Takes on Tuesday's and Friday's   ferrous sulfate 325 (65 FE) MG tablet Take 325 mg by mouth daily.   HYDROcodone-acetaminophen 7.5-325 MG tablet Commonly known as:  NORCO Take 1 tablet by mouth every 6 (six) hours.   levothyroxine 200 MCG tablet Commonly known as:  SYNTHROID, LEVOTHROID Take 200 mcg by mouth daily before breakfast.   loratadine 10 MG tablet Commonly known as:  CLARITIN Take 10 mg by mouth daily.   multivitamin with minerals Tabs tablet Take 1 tablet by mouth daily.   nitroGLYCERIN 0.4 MG SL tablet Commonly known as:  NITROSTAT Place 0.4 mg under the tongue every 5 (five) minutes as needed for chest pain. x3 doses as needed for chest pain   omeprazole 40 MG capsule Commonly known as:  PRILOSEC Take 40 mg by mouth daily.   polyethylene glycol packet Commonly known as:  MIRALAX / GLYCOLAX Take 17 g by mouth daily as needed for mild constipation.   PROBIOTIC DAILY Caps Take 1 capsule by mouth daily with breakfast.   ranolazine 500 MG 12 hr tablet Commonly known as:  RANEXA Take 500 mg by mouth 2 (two) times daily.   tamsulosin 0.4 MG Caps capsule Commonly known as:  FLOMAX Take 0.4 mg by mouth every evening.   testosterone cypionate 200 MG/ML injection Commonly known as:  DEPOTESTOSTERONE CYPIONATE Inject 200mg  (10ml) intramuscularly every 14 days   tizanidine 6 MG capsule Commonly known as:  ZANAFLEX Take 6 mg by mouth every 6 (six) hours.   vitamin C 1000 MG tablet Take 1,000 mg by mouth daily.   XARELTO 20 MG Tabs tablet Generic drug:  rivaroxaban Take 20 mg by mouth daily with supper.      [facility] Follow up in 1 week(s).   Why:  need f/u appt made for 1 wk Contact information: [place] [address] [phone]          No Known Allergies   Procedures/Studies: Dg Chest 2 View  Result  Date: 11/02/2016 CLINICAL DATA:  Status post fall, with worsening weakness, subacute onset. Initial encounter. EXAM: CHEST  2 VIEW COMPARISON:  Chest radiograph from 02/19/2016 FINDINGS: The lungs are well-aerated. Vascular congestion is noted. There is no evidence of focal opacification, pleural effusion or pneumothorax. The heart is normal in size; the mediastinal contour is within normal limits. No acute osseous abnormalities are seen. IMPRESSION: Vascular congestion noted. Lungs remain grossly clear. No displaced rib fracture seen. Electronically Signed   By: Garald Balding M.D.   On: 11/02/2016 22:32   Ct Head Wo Contrast  Result Date: 11/02/2016 CLINICAL DATA:  71 year old male fell while trying to sit up and chair. Progressive weakness over the past 2 weeks. On blood thinner. Did not hit head. Initial encounter. EXAM: CT HEAD WITHOUT CONTRAST TECHNIQUE: Contiguous axial images were obtained from the base of the skull through the vertex without  intravenous contrast. COMPARISON:  02/19/2016. FINDINGS: Brain: No intracranial hemorrhage or CT evidence of large acute infarct. Chronic microvascular changes. Global atrophy without hydrocephalus. No intracranial mass lesion noted on this unenhanced exam. Vascular: Vascular calcifications. Skull: No skull fracture. Sinuses/Orbits: No acute orbital abnormality. Visualized sinuses are clear. Other: Negative. IMPRESSION: No acute intracranial abnormality. Atrophy. Electronically Signed   By: Genia Del M.D.   On: 11/02/2016 22:25   Dg Knee Complete 4 Views Left  Result Date: 10/29/2016 CLINICAL DATA:  Fall. EXAM: LEFT KNEE - COMPLETE 4+ VIEW COMPARISON:  No recent prior. FINDINGS: Total left knee replacement. A fracture of the superior aspect of the posterior portion of the patella and patellar prosthesis cannot be excluded. Femoral and tibial prostheses are intact and in good anatomic position. No other focal bony abnormality identified. Loose bodies may be  present. Knee joint effusion cannot be excluded. Peripheral vascular calcification. IMPRESSION: Total left knee replacement. A fracture of the superior aspect of the posterior portion of the patella and patellar prostheses cannot be excluded. Electronically Signed   By: Marcello Moores  Register   On: 10/29/2016 10:43   Ct Venogram Abd/pel  Result Date: 11/05/2016 CLINICAL DATA:  History of hypertension, hyperlipidemia, and GERD, psoriasis, atrial fibrillation, now with urinary frequency and burning on urination. Found to have a UTI. EXAM: CT ABDOMEN AND PELVIS WITH CONTRAST TECHNIQUE: Multidetector CT imaging of the abdomen and pelvis was performed using the standard protocol following bolus administration of intravenous contrast. CONTRAST:  100 ISOVUE-300 IOPAMIDOL (ISOVUE-300) INJECTION 61% COMPARISON:  Lumbar spine CT - 08/06/2013 FINDINGS: Lower chest: Limited visualization of the lower thorax is negative for focal airspace opacity or pleural effusion. Minimal subsegmental atelectasis within the medial basilar segment of the right lower lobe. Hepatobiliary: Normal hepatic contour. No discrete hepatic lesions. No radiopaque gallstones. No intra extrahepatic biliary ductal dilatation. No ascites. Pancreas: Normal appearance of the pancreas Spleen: Normal appearance of the spleen. Adrenals/Urinary Tract: There is symmetric enhancement of the bilateral kidneys. There is a large (at least 5.4 cm) staghorn calculus involving near the entirely of the right renal collecting system and right renal pelvis. Note is made of at least 2 punctate nonobstructing left-sided renal stones with dominant nonobstructing stone within the anterior interpolar aspect the left kidney measuring 4 mm in diameter (image 32, series 3). Punctate (approximately 1 cm) hypoattenuating left-sided renal cyst. Additional bilateral subcentimeter hypoattenuating lesions are too small to accurately characterize though favored to represent additional renal  cysts. Minimal amount of grossly symmetric bilateral perinephric stranding. No urinary obstruction. Normal appearance of the urinary bladder given degree distention though evaluation degraded secondary streak artifact from the patient's right total hip prosthesis. There is an approximately 1.4 cm nodule within the lateral limb of the right adrenal gland which is incompletely characterize though appears morphologically similar to remote lumbar spine CT performed 07/2013 favored to represent a benign adrenal adenoma. No discrete left-sided adrenal lesions. Stomach/Bowel: Moderate to large colonic stool burden without evidence of enteric obstruction. The bowel is normal in course and caliber without wall thickening or evidence of enteric obstruction. Normal appearance of the terminal ileum and appendix. No pneumoperitoneum, pneumatosis or portal venous gas. Vascular/Lymphatic: Moderate to large amount of slightly irregular mixed calcified and noncalcified atherosclerotic plaque with a normal caliber abdominal aorta. The IVC and pelvic venous system appears widely patent without perivascular stranding. Scattered retroperitoneal lymph nodes are numerous though individually not enlarged by size criteria. No bulky retroperitoneal, mesenteric, pelvic or inguinal lymphadenopathy. Reproductive: Suboptimally evaluated  secondary streak artifact from the patient's right total hip prosthesis. Other: No neck approximately 5.4 x 4.2 cm mesenteric fat containing periumbilical hernia (sagittal image 121, series 5). Musculoskeletal: Post suspected read to right total hip prosthesis. This finding is associated with asymmetric atrophy of the right pelvic and gluteal musculature. Old fractures involving the right seventh and tenth ribs. IMPRESSION: 1. Large (at least 5.4 cm) right-sided staghorn calculus involving nearly all of the right-sided renal calices and the entirety of the right renal pelvis. 2. Punctate (sub 4 mm) nonobstructing  left-sided renal stones. 3. The IVC and pelvic venous system appears widely patent. 4.  Aortic Atherosclerosis (ICD10-170.0) Electronically Signed   By: Sandi Mariscal M.D.   On: 11/05/2016 12:48       Discharge Exam: Vitals:   11/04/16 2208 11/05/16 0558  BP: (!) 141/75 (!) 159/68  Pulse: (!) 111 (!) 107  Resp: 20 20  Temp: 98.7 F (37.1 C) 98.7 F (37.1 C)   Vitals:   11/04/16 0452 11/04/16 1317 11/04/16 2208 11/05/16 0558  BP: (!) 152/80 (!) 142/68 (!) 141/75 (!) 159/68  Pulse: (!) 106 95 (!) 111 (!) 107  Resp: (!) 22 20 20 20   Temp: 97.6 F (36.4 C) 98.3 F (36.8 C) 98.7 F (37.1 C) 98.7 F (37.1 C)  TempSrc: Oral Oral Oral Oral  SpO2: 95% 93% 94% 92%    General: Pt is alert, awake, not in acute distress Cardiovascular: RRR, S1/S2 +, no rubs, no gallops Respiratory: CTA bilaterally, no wheezing, no rhonchi Abdominal: Soft, NT, ND, bowel sounds + Extremities: left lower extremity edema, no cyanosis    The results of significant diagnostics from this hospitalization (including imaging, microbiology, ancillary and laboratory) are listed below for reference.     Microbiology: Recent Results (from the past 240 hour(s))  Urine culture     Status: Abnormal   Collection Time: 11/02/16 10:40 PM  Result Value Ref Range Status   Specimen Description URINE, CLEAN CATCH  Final   Special Requests NONE  Final   Culture MULTIPLE SPECIES PRESENT, SUGGEST RECOLLECTION (A)  Final   Report Status 11/04/2016 FINAL  Final     Labs: BNP (last 3 results)  Recent Labs  11/02/16 2244  BNP [id number]*   Basic Metabolic Panel:  Recent Labs Lab 11/02/16 2244 11/03/16 1622 11/04/16 0602 11/05/16 0618  NA 136 134* 135 137  K 3.7 3.5 5.0 3.7  CL 102 100* 101 103  CO2 25 26 26 27   GLUCOSE 113* 121* 115* 113*  BUN 27* 23* 21* 22*  CREATININE 1.21 1.12 0.98 1.05  CALCIUM 8.3* 8.3* 8.3* 8.6*   Liver Function Tests:  Recent Labs Lab 11/02/16 2244  AST 45*  ALT 40  ALKPHOS 86   BILITOT 1.5*  PROT 7.2  ALBUMIN 2.8*   No results for input(s): LIPASE, AMYLASE in the last 168 hours. No results for input(s): AMMONIA in the last 168 hours. CBC:  Recent Labs Lab 11/02/16 2244 11/03/16 1622 11/04/16 0602 11/05/16 0618  WBC 9.2 9.7 9.7 8.0  NEUTROABS 7.1  --   --   --   HGB 11.7* 12.8* 12.7* 12.0*  HCT 34.7* 37.9* 37.3* 36.2*  MCV 88.3 88.3 88.8 88.5  PLT 257 273 293 296   Cardiac Enzymes:  Recent Labs Lab 11/02/16 2246  CKTOTAL 199   BNP: Invalid input(s): POCBNP CBG: No results for input(s): GLUCAP in the last 168 hours. D-Dimer No results for input(s): DDIMER in the last 72 hours. Hgb  A1c No results for input(s): HGBA1C in the last 72 hours. Lipid Profile No results for input(s): CHOL, HDL, LDLCALC, TRIG, CHOLHDL, LDLDIRECT in the last 72 hours. Thyroid function studies No results for input(s): TSH, T4TOTAL, T3FREE, THYROIDAB in the last 72 hours.  Invalid input(s): FREET3 Anemia work up No results for input(s): VITAMINB12, FOLATE, FERRITIN, TIBC, IRON, RETICCTPCT in the last 72 hours. Urinalysis    Component Value Date/Time   COLORURINE AMBER (A) 11/02/2016 2236   APPEARANCEUR CLOUDY (A) 11/02/2016 2236   LABSPEC 1.017 11/02/2016 2236   PHURINE 7.0 11/02/2016 2236   GLUCOSEU NEGATIVE 11/02/2016 2236   HGBUR LARGE (A) 11/02/2016 2236   BILIRUBINUR NEGATIVE 11/02/2016 2236   KETONESUR NEGATIVE 11/02/2016 2236   PROTEINUR 100 (A) 11/02/2016 2236   UROBILINOGEN 1.0 10/24/2013 1112   NITRITE NEGATIVE 11/02/2016 2236   LEUKOCYTESUR LARGE (A) 11/02/2016 2236   Sepsis Labs Invalid input(s): PROCALCITONIN,  WBC,  LACTICIDVEN Microbiology Recent Results (from the past 240 hour(s))  Urine culture     Status: Abnormal   Collection Time: 11/02/16 10:40 PM  Result Value Ref Range Status   Specimen Description URINE, CLEAN CATCH  Final   Special Requests NONE  Final   Culture MULTIPLE SPECIES PRESENT, SUGGEST RECOLLECTION (A)  Final    Report Status 11/04/2016 FINAL  Final     Time coordinating discharge: Over 30 minutes  SIGNED:   Debbe Odea, MD  [facility] 11/05/2016, 2:27 PM Pager   If 7PM-7AM, please contact night-coverage [email or web address] Password [email or web address]

## 2016-11-05 NOTE — Progress Notes (Addendum)
LCSW has spoken with [facility]. Patient will transport by EMS. [name] has been contacted and updated on plan of discharge. He is in agreement. RN to call report.  All documents sent to facility via [place]. DC to SNF.   Bed has been extended for patient under private pay 240.00/per day. [facility] can accept today or over weekend, unclear if patient is medically stable. Patient has a CPAP machine which is his and will go with patient to SNF.  Followed up with patient's husband who is in agreement with plan.  Lane Hacker, MSW Clinical Social Work: [employer] Coverage for :  [phone]

## 2016-11-09 ENCOUNTER — Encounter: Payer: Self-pay | Admitting: Internal Medicine

## 2016-11-09 ENCOUNTER — Non-Acute Institutional Stay (SKILLED_NURSING_FACILITY): Payer: Medicare Other | Admitting: Internal Medicine

## 2016-11-09 DIAGNOSIS — R7989 Other specified abnormal findings of blood chemistry: Secondary | ICD-10-CM

## 2016-11-09 DIAGNOSIS — E349 Endocrine disorder, unspecified: Secondary | ICD-10-CM

## 2016-11-09 DIAGNOSIS — F329 Major depressive disorder, single episode, unspecified: Secondary | ICD-10-CM | POA: Diagnosis not present

## 2016-11-09 DIAGNOSIS — M25562 Pain in left knee: Secondary | ICD-10-CM

## 2016-11-09 DIAGNOSIS — I480 Paroxysmal atrial fibrillation: Secondary | ICD-10-CM

## 2016-11-09 DIAGNOSIS — J3089 Other allergic rhinitis: Secondary | ICD-10-CM

## 2016-11-09 DIAGNOSIS — E038 Other specified hypothyroidism: Secondary | ICD-10-CM

## 2016-11-09 DIAGNOSIS — J449 Chronic obstructive pulmonary disease, unspecified: Secondary | ICD-10-CM

## 2016-11-09 DIAGNOSIS — F419 Anxiety disorder, unspecified: Secondary | ICD-10-CM | POA: Diagnosis not present

## 2016-11-09 DIAGNOSIS — N200 Calculus of kidney: Secondary | ICD-10-CM | POA: Diagnosis not present

## 2016-11-09 DIAGNOSIS — M25462 Effusion, left knee: Secondary | ICD-10-CM

## 2016-11-09 DIAGNOSIS — E43 Unspecified severe protein-calorie malnutrition: Secondary | ICD-10-CM

## 2016-11-09 DIAGNOSIS — I10 Essential (primary) hypertension: Secondary | ICD-10-CM | POA: Diagnosis not present

## 2016-11-09 NOTE — Progress Notes (Signed)
Patient ID: [name], male   DOB: Mar 02, 1946, 71 y.o.   MRN: [id number]    HISTORY AND PHYSICAL   DATE: 11/09/2016   Location:    [facility] Room Number: 154 B Place of Service: SNF (31)   Extended Emergency Contact Information Primary Emergency Contact: Ruggles,Michael Address: [address]          [place], [place] [address] [place] Home Phone: [phone] Mobile Phone: [phone] Relation: Significant other  Advanced Directive information Does Patient Have a Medical Advance Directive?: No, Does patient want to make changes to medical advance directive?: No - Patient declined  Chief Complaint  Patient presents with  . New Admit To SNF    HPI:  71 yo male sen today as a new admission into SNF following hospital stay for UTI, staghorn renal calculus, fall, LLE edema and hx low testosterone/hypothyroidism/psoriasis on Enbrel injections/GERD/anxiety and depression/CAD/PAF on xeralto/hyperlipidemia/BPH/COPD. He was initially started on IV rocephin-->urine cx (+) multiple spp --> abx changed to Cipro and doxy x 8days. CT revealed large renal staghorn calculus. He had LLE edema but duplex US neg for DVT. CT venogram of A/P neg. 2D echo revealed nml EF, biatrial enlargement and dilated IVC. Albumin 2.8; Hgb 12; T bilirubin 1.5; Cr 1.21-->1.05 at d/c. He presents to SNF for short term rehab  Today he reports pain controlled. No SOB. He has a hx tobacco abuse. [name] His male spouse is present. Pt has been tolerating PT. Appetite ok. Sleeps ok. No falls.   BPH/lowT - he gets testosterone injections 2 times per month  Hypothyroidism - stable on levothyroxine.  Psoriasis - stable on enbrel. Followed by derm  DJD - Pain stable on ATC norco and zanaflex  GERD - stable on prilosec. Constipation stable on miralax  Anxiety/depression - mood stable on cymbalta, klonopin and wellbutrin  CAD/PAF - stable on renexa. HR controlled on amiodarone and  cardizem CD. He takes xeralto. Followed by cardio  Hyperlipidemia - stable on lipitor  COPD/seasonal allergy - stable on claritin  Hx anemia - stable on iron daily. Hgb 12  Osteoporosis with hx L1 compression fx - stable on prolia injections q 6 mos and Ca/Vit D supplements  Past Medical History:  Diagnosis Date  . A-fib (HCC)    hx.- sinus rhythm now  . Acid reflux   . Acute blood loss anemia   . Anxiety   . Arthritis   . BPH (benign prostatic hyperplasia)   . CAD (coronary artery disease)   . Depression   . Dislocation of internal right hip prosthesis, sequela   . Environmental allergies   . Fracture of wrist 10/23/13   LEFT  . High cholesterol   . HLD (hyperlipidemia)   . Hypertension   . Low testosterone, possible hypogonadism 10/01/2012  . Lumbago   . Metabolic bone disease   . Osteoporosis   . Plaque psoriasis   . Sleep apnea    cpap machine-settings 17  . Thrombocytopenia (HCC)   . Thyroid disease    HX GRAVES DISEASE  . Transfusion history    s/p 12'13 hip surgery  . Unsteady gait   . Unsteady gait   . Vertebral compression fracture (HCC)    L1-wears brace    Past Surgical History:  Procedure Laterality Date  . APPENDECTOMY    . CARDIAC CATHETERIZATION  2013  . HARDWARE REMOVAL  10/05/2012   Procedure: HARDWARE REMOVAL;  Surgeon: Shelda Pal, MD;  Location: [facility];  Service: Orthopedics;  Laterality: Right;  REMOVING  STRYKER  GAMMA NAIL  . HARDWARE REMOVAL Right 07/03/2013   Procedure: HARDWARE REMOVAL RIGHT TIBIA ;  Surgeon: Rozanna Box, MD;  Location: [facility];  Service: Orthopedics;  Laterality: Right;  . HIP CLOSED REDUCTION Right 10/15/2013   Procedure: CLOSED MANIPULATION HIP;  Surgeon: [name], MD;  Location: [facility];  Service: Orthopedics;  Laterality: Right;  . INCISION AND DRAINAGE HIP Right 11/16/2013   Procedure: IRRIGATION AND DEBRIDEMENT RIGHT HIP;  Surgeon: [name], MD;  Location: [facility];  Service: Orthopedics;  Laterality:  Right;  . JOINT REPLACEMENT     hip-right x2  . KNEE SURGERY Bilateral 2012   Total knee replacements  . ORIF TIBIA FRACTURE Right 02/06/2013   Procedure: OPEN REDUCTION INTERNAL FIXATION (ORIF) TIBIA FRACTURE WITH IM ROD FIBULA;  Surgeon: Rozanna Box, MD;  Location: [facility];  Service: Orthopedics;  Laterality: Right;  . ORIF TIBIA FRACTURE Right 07/03/2013   Procedure: RIGHT TIBIA NON UNION REPAIR ;  Surgeon: Rozanna Box, MD;  Location: [facility];  Service: Orthopedics;  Laterality: Right;  . ORIF WRIST FRACTURE  10/02/2012   Procedure: OPEN REDUCTION INTERNAL FIXATION (ORIF) WRIST FRACTURE;  Surgeon: Roseanne Kaufman, MD;  Location: [facility];  Service: Orthopedics;  Laterality: Right;  WITH   ANTIBIOTIC  CEMENT  . ORIF WRIST FRACTURE Left 10/28/2013   Procedure: OPEN REDUCTION INTERNAL FIXATION (ORIF) WRIST FRACTURE with allograft;  Surgeon: Roseanne Kaufman, MD;  Location: [facility];  Service: Orthopedics;  Laterality: Left;  DVR Plate  . right femur surgery  05/2012  . TONSILLECTOMY    . TOTAL HIP REVISION  10/05/2012   Procedure: TOTAL HIP REVISION;  Surgeon: [name], MD;  Location: [facility];  Service: Orthopedics;  Laterality: Right;  RIGHT TOTAL HIP REVISION  . TOTAL HIP REVISION Right 09/17/2013   Procedure: REVISION RIGHT TOTAL HIP ARTHROPLASTY ;  Surgeon: [name], MD;  Location: [facility];  Service: Orthopedics;  Laterality: Right;  . TOTAL HIP REVISION Right 10/26/2013   Procedure: REVISION RIGHT TOTAL HIP ARTHROPLASTY;  Surgeon: [name], MD;  Location: [facility];  Service: Orthopedics;  Laterality: Right;  . WRIST FRACTURE SURGERY  05/2012    Patient Care Team: Janie Morning, DO as PCP - General (Family Medicine)  Social History   Social History  . Marital status: Significant Other    Spouse name: N/A  . Number of children: N/A  . Years of education: N/A   Occupational History  . Not on file.   Social History Main Topics  . Smoking status: Former Smoker    Quit date:  06/05/2012  . Smokeless tobacco: Never Used  . Alcohol use Yes     Comment: occasional-social  . Drug use: No  . Sexual activity: Not on file   Other Topics Concern  . Not on file   Social History Narrative   [place] 2.5 months, went home Feb 21st slipped and fell on back and developed.  Home PT/OT.  Just started outpatient physical therapy.  Friday night, misstepped.       reports that he quit smoking about 4 years ago. He has never used smokeless tobacco. He reports that he drinks alcohol. He reports that he does not use drugs.  Family History  Problem Relation Age of Onset  . CAD Father 56  . Arthritis Mother    Family Status  Relation Status  . Father   .  Mother     Immunization History  Administered Date(s) Administered  . Influenza,inj,Quad PF,36+ Mos 07/05/2013  . PPD Test 03/19/2016, 11/05/2016    No Known Allergies  Medications: Patient's Medications  New Prescriptions   No medications on file  Previous Medications   AMIODARONE (PACERONE) 200 MG TABLET    Take 200 mg by mouth daily.   ASCORBIC ACID (VITAMIN C) 1000 MG TABLET    Take 1,000 mg by mouth daily.   ATORVASTATIN (LIPITOR) 80 MG TABLET    Take 80 mg by mouth every evening.    BUPROPION (WELLBUTRIN XL) 300 MG 24 HR TABLET    Take 300 mg by mouth daily with breakfast.    CALCIUM PO    Take 400 mg by mouth 2 (two) times daily.   CHOLECALCIFEROL 4000 UNITS CAPS    Take 4,000 Units by mouth daily.   CIPROFLOXACIN (CIPRO) 500 MG TABLET    Take 1 tablet (500 mg total) by mouth 2 (two) times daily.   CLONAZEPAM (KLONOPIN) 1 MG TABLET    Take 1 mg by mouth 4 (four) times daily.    DENOSUMAB (PROLIA) 60 MG/ML SOLN INJECTION    Inject 60 mg into the skin every 6 (six) months. Administer in upper arm, thigh, or abdomen   DILTIAZEM (CARDIZEM CD) 240 MG 24 HR CAPSULE    Take 240 mg by mouth every morning.    DOXYCYCLINE (VIBRA-TABS) 100 MG TABLET    Take 100 mg by mouth daily.   DULOXETINE (CYMBALTA) 60 MG CAPSULE     Take 60 mg by mouth 2 (two) times daily.    ETANERCEPT (ENBREL) 50 MG/ML INJECTION    Inject 50 mg into the skin 2 (two) times a week. Takes on Tuesday's and Friday's   FERROUS SULFATE 325 (65 FE) MG TABLET    Take 325 mg by mouth daily.    HYDROCODONE-ACETAMINOPHEN (NORCO) 7.5-325 MG TABLET    Take 1 tablet by mouth every 6 (six) hours.   LEVOTHYROXINE (SYNTHROID, LEVOTHROID) 200 MCG TABLET    Take 200 mcg by mouth daily before breakfast.    LORATADINE (CLARITIN) 10 MG TABLET    Take 10 mg by mouth daily.    MULTIPLE VITAMIN (MULTIVITAMIN WITH MINERALS) TABS TABLET    Take 1 tablet by mouth daily.   NITROGLYCERIN (NITROSTAT) 0.4 MG SL TABLET    Place 0.4 mg under the tongue every 5 (five) minutes as needed for chest pain. x3 doses as needed for chest pain   OMEPRAZOLE (PRILOSEC) 40 MG CAPSULE    Take 40 mg by mouth daily.   POLYETHYLENE GLYCOL (MIRALAX / GLYCOLAX) PACKET    Take 17 g by mouth daily as needed for mild constipation.    RANOLAZINE (RANEXA) 500 MG 12 HR TABLET    Take 500 mg by mouth 2 (two) times daily.    RIVAROXABAN (XARELTO) 20 MG TABS TABLET    Take 20 mg by mouth daily with supper.    SACCHAROMYCES BOULARDII (FLORASTOR) 250 MG CAPSULE    Take 250 mg by mouth 2 (two) times daily.   TAMSULOSIN HCL (FLOMAX) 0.4 MG CAPS    Take 0.4 mg by mouth every evening.    TESTOSTERONE CYPIONATE (DEPOTESTOSTERONE CYPIONATE) 200 MG/ML INJECTION    Inject '200mg'$  (71m) intramuscularly every 14 days   TIZANIDINE (ZANAFLEX) 6 MG CAPSULE    Take 6 mg by mouth every 6 (six) hours.   Modified Medications   No medications on file  Discontinued  Medications   PROBIOTIC PRODUCT (PROBIOTIC DAILY) CAPS    Take 1 capsule by mouth daily with breakfast.    Review of Systems  Respiratory: Positive for wheezing.   Cardiovascular: Positive for leg swelling.  Musculoskeletal: Positive for arthralgias and gait problem.  Skin: Positive for color change and rash.  All other systems reviewed and are  negative.   Vitals:   11/09/16 0935  BP: (!) 135/54  Pulse: (!) 102  Resp: 20  Temp: 98.6 F (37 C)  TempSrc: Oral  SpO2: 95%  Weight: 297 lb (134.7 kg)  Height: '5\' 8"'$  (1.727 m)   Body mass index is 45.16 kg/m.  Physical Exam  Constitutional: He is oriented to person, place, and time. He appears well-developed.  Sitting up in bed, frail appearing in NAD  HENT:  Mouth/Throat: Oropharynx is clear and moist.  Eyes: Pupils are equal, round, and reactive to light. No scleral icterus.  Neck: Neck supple. Carotid bruit is not present. No thyromegaly present.  Cardiovascular: Normal rate, regular rhythm and intact distal pulses.  Exam reveals no gallop and no friction rub.   Murmur (1/6 SEM) heard. +1 pitting L>RLE edema. No calf TTP; chronic venous stasis changes  Pulmonary/Chest: Effort normal. He has wheezes (b/l end expiratory wheezing). He has no rales. He exhibits no tenderness.  Abdominal: Soft. Bowel sounds are normal. He exhibits no distension, no abdominal bruit, no pulsatile midline mass and no mass. There is no hepatomegaly. There is no tenderness. There is no rebound and no guarding.  Obese; Reducible umbilicus hernia  Musculoskeletal: He exhibits edema (left knee with reduced ROM).  Lymphadenopathy:    He has no cervical adenopathy.  Neurological: He is alert and oriented to person, place, and time.  Skin: Skin is warm and dry. No rash noted.  (+) tattoos; right medial malleolus abrasion but no d/c.  Psychiatric: He has a normal mood and affect. His behavior is normal. Thought content normal.     Labs reviewed: Admission on 11/02/2016, Discharged on 11/05/2016  Component Date Value Ref Range Status  . WBC 11/02/2016 9.2  4.0 - 10.5 K/uL Final  . RBC 11/02/2016 3.93* 4.22 - 5.81 MIL/uL Final  . Hemoglobin 11/02/2016 11.7* 13.0 - 17.0 g/dL Final  . HCT 11/02/2016 34.7* 39.0 - 52.0 % Final  . MCV 11/02/2016 88.3  78.0 - 100.0 fL Final  . MCH 11/02/2016 29.8  26.0 -  34.0 pg Final  . MCHC 11/02/2016 33.7  30.0 - 36.0 g/dL Final  . RDW 11/02/2016 15.9* 11.5 - 15.5 % Final  . Platelets 11/02/2016 257  150 - 400 K/uL Final  . Neutrophils Relative % 11/02/2016 77  % Final  . Neutro Abs 11/02/2016 7.1  1.7 - 7.7 K/uL Final  . Lymphocytes Relative 11/02/2016 12  % Final  . Lymphs Abs 11/02/2016 1.1  0.7 - 4.0 K/uL Final  . Monocytes Relative 11/02/2016 11  % Final  . Monocytes Absolute 11/02/2016 1.0  0.1 - 1.0 K/uL Final  . Eosinophils Relative 11/02/2016 0  % Final  . Eosinophils Absolute 11/02/2016 0.0  0.0 - 0.7 K/uL Final  . Basophils Relative 11/02/2016 0  % Final  . Basophils Absolute 11/02/2016 0.0  0.0 - 0.1 K/uL Final  . Sodium 11/02/2016 136  135 - 145 mmol/L Final  . Potassium 11/02/2016 3.7  3.5 - 5.1 mmol/L Final  . Chloride 11/02/2016 102  101 - 111 mmol/L Final  . CO2 11/02/2016 25  22 - 32 mmol/L Final  .  Glucose, Bld 11/02/2016 113* 65 - 99 mg/dL Final  . BUN 11/02/2016 27* 6 - 20 mg/dL Final  . Creatinine, Ser 11/02/2016 1.21  0.61 - 1.24 mg/dL Final  . Calcium 11/02/2016 8.3* 8.9 - 10.3 mg/dL Final  . Total Protein 11/02/2016 7.2  6.5 - 8.1 g/dL Final  . Albumin 11/02/2016 2.8* 3.5 - 5.0 g/dL Final  . AST 11/02/2016 45* 15 - 41 U/L Final  . ALT 11/02/2016 40  17 - 63 U/L Final  . Alkaline Phosphatase 11/02/2016 86  38 - 126 U/L Final  . Total Bilirubin 11/02/2016 1.5* 0.3 - 1.2 mg/dL Final  . GFR calc non Af Amer 11/02/2016 58* >60 mL/min Final  . GFR calc Af Amer 11/02/2016 >60  >60 mL/min Final   Comment: (NOTE) The eGFR has been calculated using the CKD EPI equation. This calculation has not been validated in all clinical situations. eGFR's persistently <60 mL/min signify possible Chronic Kidney Disease.   . Anion gap 11/02/2016 9  5 - 15 Final  . Lactic Acid, Venous 11/02/2016 1.02  0.5 - 1.9 mmol/L Final  . Color, Urine 11/02/2016 AMBER* YELLOW Final  . APPearance 11/02/2016 CLOUDY* CLEAR Final  . Specific Gravity, Urine  11/02/2016 1.017  1.005 - 1.030 Final  . pH 11/02/2016 7.0  5.0 - 8.0 Final  . Glucose, UA 11/02/2016 NEGATIVE  NEGATIVE mg/dL Final  . Hgb urine dipstick 11/02/2016 LARGE* NEGATIVE Final  . Bilirubin Urine 11/02/2016 NEGATIVE  NEGATIVE Final  . Ketones, ur 11/02/2016 NEGATIVE  NEGATIVE mg/dL Final  . Protein, ur 11/02/2016 100* NEGATIVE mg/dL Final  . Nitrite 11/02/2016 NEGATIVE  NEGATIVE Final  . Leukocytes, UA 11/02/2016 LARGE* NEGATIVE Final  . RBC / HPF 11/02/2016 TOO NUMEROUS TO COUNT  0 - 5 RBC/hpf Final  . WBC, UA 11/02/2016 TOO NUMEROUS TO COUNT  0 - 5 WBC/hpf Final  . Bacteria, UA 11/02/2016 MANY* NONE SEEN Final  . Squamous Epithelial / LPF 11/02/2016 NONE SEEN  NONE SEEN Final  . Mucous 11/02/2016 PRESENT   Final  . Troponin i, poc 11/02/2016 0.00  0.00 - 0.08 ng/mL Final  . Comment 3 11/02/2016          Final   Comment: Due to the release kinetics of cTnI, a negative result within the first hours of the onset of symptoms does not rule out myocardial infarction with certainty. If myocardial infarction is still suspected, repeat the test at appropriate intervals.   . Prothrombin Time 11/02/2016 27.0* 11.4 - 15.2 seconds Final  . INR 11/02/2016 2.45   Final  . B Natriuretic Peptide 11/02/2016 204.9* 0.0 - 100.0 pg/mL Final  . Specimen Description 11/02/2016 URINE, CLEAN CATCH   Final  . Special Requests 11/02/2016 NONE   Final  . Culture 11/02/2016 MULTIPLE SPECIES PRESENT, SUGGEST RECOLLECTION*  Final  . Report Status 11/02/2016 11/04/2016 FINAL   Final  . Lactic Acid, Venous 11/03/2016 0.67  0.5 - 1.9 mmol/L Final  . Total CK 11/02/2016 199  49 - 397 U/L Final  . Creatinine, Urine 11/03/2016 99.11  mg/dL Final  . Sodium, Ur 11/03/2016 30  mmol/L Final  . Sodium 11/03/2016 134* 135 - 145 mmol/L Final  . Potassium 11/03/2016 3.5  3.5 - 5.1 mmol/L Final  . Chloride 11/03/2016 100* 101 - 111 mmol/L Final  . CO2 11/03/2016 26  22 - 32 mmol/L Final  . Glucose, Bld  11/03/2016 121* 65 - 99 mg/dL Final  . BUN 11/03/2016 23* 6 - 20 mg/dL  Final  . Creatinine, Ser 11/03/2016 1.12  0.61 - 1.24 mg/dL Final  . Calcium 11/03/2016 8.3* 8.9 - 10.3 mg/dL Final  . GFR calc non Af Amer 11/03/2016 >60  >60 mL/min Final  . GFR calc Af Amer 11/03/2016 >60  >60 mL/min Final   Comment: (NOTE) The eGFR has been calculated using the CKD EPI equation. This calculation has not been validated in all clinical situations. eGFR's persistently <60 mL/min signify possible Chronic Kidney Disease.   . Anion gap 11/03/2016 8  5 - 15 Final  . WBC 11/03/2016 9.7  4.0 - 10.5 K/uL Final  . RBC 11/03/2016 4.29  4.22 - 5.81 MIL/uL Final  . Hemoglobin 11/03/2016 12.8* 13.0 - 17.0 g/dL Final  . HCT 11/03/2016 37.9* 39.0 - 52.0 % Final  . MCV 11/03/2016 88.3  78.0 - 100.0 fL Final  . MCH 11/03/2016 29.8  26.0 - 34.0 pg Final  . MCHC 11/03/2016 33.8  30.0 - 36.0 g/dL Final  . RDW 11/03/2016 16.0* 11.5 - 15.5 % Final  . Platelets 11/03/2016 273  150 - 400 K/uL Final  . BP 11/03/2016 144/75  mmHg Final  . Sodium 11/04/2016 135  135 - 145 mmol/L Final  . Potassium 11/04/2016 5.0  3.5 - 5.1 mmol/L Final   Comment: DELTA CHECK NOTED REPEATED TO VERIFY MODERATE HEMOLYSIS   . Chloride 11/04/2016 101  101 - 111 mmol/L Final  . CO2 11/04/2016 26  22 - 32 mmol/L Final  . Glucose, Bld 11/04/2016 115* 65 - 99 mg/dL Final  . BUN 11/04/2016 21* 6 - 20 mg/dL Final  . Creatinine, Ser 11/04/2016 0.98  0.61 - 1.24 mg/dL Final  . Calcium 11/04/2016 8.3* 8.9 - 10.3 mg/dL Final  . GFR calc non Af Amer 11/04/2016 >60  >60 mL/min Final  . GFR calc Af Amer 11/04/2016 >60  >60 mL/min Final   Comment: (NOTE) The eGFR has been calculated using the CKD EPI equation. This calculation has not been validated in all clinical situations. eGFR's persistently <60 mL/min signify possible Chronic Kidney Disease.   . Anion gap 11/04/2016 8  5 - 15 Final  . WBC 11/04/2016 9.7  4.0 - 10.5 K/uL Final  . RBC  11/04/2016 4.20* 4.22 - 5.81 MIL/uL Final  . Hemoglobin 11/04/2016 12.7* 13.0 - 17.0 g/dL Final  . HCT 11/04/2016 37.3* 39.0 - 52.0 % Final  . MCV 11/04/2016 88.8  78.0 - 100.0 fL Final  . MCH 11/04/2016 30.2  26.0 - 34.0 pg Final  . MCHC 11/04/2016 34.0  30.0 - 36.0 g/dL Final  . RDW 11/04/2016 16.1* 11.5 - 15.5 % Final  . Platelets 11/04/2016 293  150 - 400 K/uL Final  . Sodium 11/05/2016 137  135 - 145 mmol/L Final  . Potassium 11/05/2016 3.7  3.5 - 5.1 mmol/L Final   Comment: DELTA CHECK NOTED REPEATED TO VERIFY   . Chloride 11/05/2016 103  101 - 111 mmol/L Final  . CO2 11/05/2016 27  22 - 32 mmol/L Final  . Glucose, Bld 11/05/2016 113* 65 - 99 mg/dL Final  . BUN 11/05/2016 22* 6 - 20 mg/dL Final  . Creatinine, Ser 11/05/2016 1.05  0.61 - 1.24 mg/dL Final  . Calcium 11/05/2016 8.6* 8.9 - 10.3 mg/dL Final  . GFR calc non Af Amer 11/05/2016 >60  >60 mL/min Final  . GFR calc Af Amer 11/05/2016 >60  >60 mL/min Final   Comment: (NOTE) The eGFR has been calculated using the CKD EPI equation. This calculation  has not been validated in all clinical situations. eGFR's persistently <60 mL/min signify possible Chronic Kidney Disease.   . Anion gap 11/05/2016 7  5 - 15 Final  . WBC 11/05/2016 8.0  4.0 - 10.5 K/uL Final  . RBC 11/05/2016 4.09* 4.22 - 5.81 MIL/uL Final  . Hemoglobin 11/05/2016 12.0* 13.0 - 17.0 g/dL Final  . HCT 11/05/2016 36.2* 39.0 - 52.0 % Final  . MCV 11/05/2016 88.5  78.0 - 100.0 fL Final  . MCH 11/05/2016 29.3  26.0 - 34.0 pg Final  . MCHC 11/05/2016 33.1  30.0 - 36.0 g/dL Final  . RDW 11/05/2016 15.9* 11.5 - 15.5 % Final  . Platelets 11/05/2016 296  150 - 400 K/uL Final    Dg Chest 2 View  Result Date: 11/02/2016 CLINICAL DATA:  Status post fall, with worsening weakness, subacute onset. Initial encounter. EXAM: CHEST  2 VIEW COMPARISON:  Chest radiograph from 02/19/2016 FINDINGS: The lungs are well-aerated. Vascular congestion is noted. There is no evidence of  focal opacification, pleural effusion or pneumothorax. The heart is normal in size; the mediastinal contour is within normal limits. No acute osseous abnormalities are seen. IMPRESSION: Vascular congestion noted. Lungs remain grossly clear. No displaced rib fracture seen. Electronically Signed   By: Garald Balding M.D.   On: 11/02/2016 22:32   Ct Head Wo Contrast  Result Date: 11/02/2016 CLINICAL DATA:  71 year old male fell while trying to sit up and chair. Progressive weakness over the past 2 weeks. On blood thinner. Did not hit head. Initial encounter. EXAM: CT HEAD WITHOUT CONTRAST TECHNIQUE: Contiguous axial images were obtained from the base of the skull through the vertex without intravenous contrast. COMPARISON:  02/19/2016. FINDINGS: Brain: No intracranial hemorrhage or CT evidence of large acute infarct. Chronic microvascular changes. Global atrophy without hydrocephalus. No intracranial mass lesion noted on this unenhanced exam. Vascular: Vascular calcifications. Skull: No skull fracture. Sinuses/Orbits: No acute orbital abnormality. Visualized sinuses are clear. Other: Negative. IMPRESSION: No acute intracranial abnormality. Atrophy. Electronically Signed   By: Genia Del M.D.   On: 11/02/2016 22:25   Dg Knee Complete 4 Views Left  Result Date: 10/29/2016 CLINICAL DATA:  Fall. EXAM: LEFT KNEE - COMPLETE 4+ VIEW COMPARISON:  No recent prior. FINDINGS: Total left knee replacement. A fracture of the superior aspect of the posterior portion of the patella and patellar prosthesis cannot be excluded. Femoral and tibial prostheses are intact and in good anatomic position. No other focal bony abnormality identified. Loose bodies may be present. Knee joint effusion cannot be excluded. Peripheral vascular calcification. IMPRESSION: Total left knee replacement. A fracture of the superior aspect of the posterior portion of the patella and patellar prostheses cannot be excluded. Electronically Signed   By:  Marcello Moores  Register   On: 10/29/2016 10:43   Ct Venogram Abd/pel  Result Date: 11/05/2016 CLINICAL DATA:  History of hypertension, hyperlipidemia, and GERD, psoriasis, atrial fibrillation, now with urinary frequency and burning on urination. Found to have a UTI. EXAM: CT ABDOMEN AND PELVIS WITH CONTRAST TECHNIQUE: Multidetector CT imaging of the abdomen and pelvis was performed using the standard protocol following bolus administration of intravenous contrast. CONTRAST:  100 ISOVUE-300 IOPAMIDOL (ISOVUE-300) INJECTION 61% COMPARISON:  Lumbar spine CT - 08/06/2013 FINDINGS: Lower chest: Limited visualization of the lower thorax is negative for focal airspace opacity or pleural effusion. Minimal subsegmental atelectasis within the medial basilar segment of the right lower lobe. Hepatobiliary: Normal hepatic contour. No discrete hepatic lesions. No radiopaque gallstones. No intra extrahepatic  biliary ductal dilatation. No ascites. Pancreas: Normal appearance of the pancreas Spleen: Normal appearance of the spleen. Adrenals/Urinary Tract: There is symmetric enhancement of the bilateral kidneys. There is a large (at least 5.4 cm) staghorn calculus involving near the entirely of the right renal collecting system and right renal pelvis. Note is made of at least 2 punctate nonobstructing left-sided renal stones with dominant nonobstructing stone within the anterior interpolar aspect the left kidney measuring 4 mm in diameter (image 32, series 3). Punctate (approximately 1 cm) hypoattenuating left-sided renal cyst. Additional bilateral subcentimeter hypoattenuating lesions are too small to accurately characterize though favored to represent additional renal cysts. Minimal amount of grossly symmetric bilateral perinephric stranding. No urinary obstruction. Normal appearance of the urinary bladder given degree distention though evaluation degraded secondary streak artifact from the patient's right total hip prosthesis. There  is an approximately 1.4 cm nodule within the lateral limb of the right adrenal gland which is incompletely characterize though appears morphologically similar to remote lumbar spine CT performed 07/2013 favored to represent a benign adrenal adenoma. No discrete left-sided adrenal lesions. Stomach/Bowel: Moderate to large colonic stool burden without evidence of enteric obstruction. The bowel is normal in course and caliber without wall thickening or evidence of enteric obstruction. Normal appearance of the terminal ileum and appendix. No pneumoperitoneum, pneumatosis or portal venous gas. Vascular/Lymphatic: Moderate to large amount of slightly irregular mixed calcified and noncalcified atherosclerotic plaque with a normal caliber abdominal aorta. The IVC and pelvic venous system appears widely patent without perivascular stranding. Scattered retroperitoneal lymph nodes are numerous though individually not enlarged by size criteria. No bulky retroperitoneal, mesenteric, pelvic or inguinal lymphadenopathy. Reproductive: Suboptimally evaluated secondary streak artifact from the patient's right total hip prosthesis. Other: No neck approximately 5.4 x 4.2 cm mesenteric fat containing periumbilical hernia (sagittal image 121, series 5). Musculoskeletal: Post suspected read to right total hip prosthesis. This finding is associated with asymmetric atrophy of the right pelvic and gluteal musculature. Old fractures involving the right seventh and tenth ribs. IMPRESSION: 1. Large (at least 5.4 cm) right-sided staghorn calculus involving nearly all of the right-sided renal calices and the entirety of the right renal pelvis. 2. Punctate (sub 4 mm) nonobstructing left-sided renal stones. 3. The IVC and pelvic venous system appears widely patent. 4.  Aortic Atherosclerosis (ICD10-170.0) Electronically Signed   By: Sandi Mariscal M.D.   On: 11/05/2016 12:48     Assessment/Plan   ICD-9-CM ICD-10-CM   1. Chronic obstructive  pulmonary disease, unspecified COPD type ([place]) 496 J44.9   2. Other allergic rhinitis 477.8 J30.89   3. Essential hypertension 401.9 I10   4. Staghorn calculus 592.0 N20.0   5. Major depression, chronic 296.20 F32.9   6. Other specified hypothyroidism 244.8 E03.8   7. PAF (paroxysmal atrial fibrillation) (HCC) 427.31 I48.0   8. Anxiety 300.00 F41.9   9. Low testosterone, possible hypogonadism 259.9 E34.9   10. Protein-calorie malnutrition, severe ([place]) 262 E43   11. Pain and swelling of left knee 719.46 M25.562    719.06 M25.462      Start duoneb via neb BID for COPD  Cont other meds as ordered  Finish abx  Wound care as indicated  F/u with urology and ortho as scheduled  F/u with other specialists as scheduled  PT/OT/ST as ordered  Nutritional supplements as ordered  GOAL: short term rehab and d/c home when medically appropriate. Communicated with pt and nursing.  Will follow   Traeton Bordas S. Eulas Post, D. O., F. A. C.  [place]  [facility] [place], [place] [address] [phone] Cell (Monday-Friday 8 AM - 5 PM) [phone] After 5 PM and follow prompts

## 2016-11-15 ENCOUNTER — Other Ambulatory Visit: Payer: Self-pay | Admitting: *Deleted

## 2016-11-15 MED ORDER — CLONAZEPAM 1 MG PO TABS: ORAL_TABLET | ORAL | 0 refills | Status: DC

## 2016-11-15 NOTE — Telephone Encounter (Signed)
[facility] #: [phone] Fax#: [phone]  

## 2016-11-18 NOTE — Progress Notes (Signed)
   11/03/16 1400  PT Time Calculation  PT Start Time (ACUTE ONLY) 1329  PT Stop Time (ACUTE ONLY) 1349  PT Time Calculation (min) (ACUTE ONLY) 20 min  PT G-Codes **NOT FOR INPATIENT CLASS**  Functional Assessment Tool Used Clinical judgement  Functional Limitation Mobility: Walking and moving around  Mobility: Walking and Moving Around Current Status [id number]) CK  Mobility: Walking and Moving Around Goal Status [id number]) CI  PT General Charges  $$ ACUTE PT VISIT 1 Procedure  PT Evaluation  $PT Eval Low Complexity 1 Procedure

## 2016-11-19 NOTE — Progress Notes (Signed)
   11/03/16 1500  OT Time Calculation  OT Start Time (ACUTE ONLY) 1333  OT Stop Time (ACUTE ONLY) 1349  OT Time Calculation (min) 16 min  OT G-codes **NOT FOR INPATIENT CLASS**  Functional Assessment Tool Used clinical observation and judgment  Functional Limitation Self care  Self Care Current Status [id number]) CJ  Self Care Goal Status [id number]) CJ  OT General Charges  $OT Visit 1 Procedure  OT Evaluation  $OT Eval Low Complexity 1 Procedure  missing G-codes Lesle Chris, OTR/L [phone] 11/19/2016

## 2016-12-07 ENCOUNTER — Encounter: Payer: Self-pay | Admitting: Internal Medicine

## 2016-12-07 ENCOUNTER — Non-Acute Institutional Stay (SKILLED_NURSING_FACILITY): Payer: Medicare Other | Admitting: Internal Medicine

## 2016-12-07 DIAGNOSIS — E43 Unspecified severe protein-calorie malnutrition: Secondary | ICD-10-CM

## 2016-12-07 DIAGNOSIS — R5381 Other malaise: Secondary | ICD-10-CM | POA: Diagnosis not present

## 2016-12-07 DIAGNOSIS — N200 Calculus of kidney: Secondary | ICD-10-CM | POA: Diagnosis not present

## 2016-12-07 DIAGNOSIS — F329 Major depressive disorder, single episode, unspecified: Secondary | ICD-10-CM

## 2016-12-07 DIAGNOSIS — I480 Paroxysmal atrial fibrillation: Secondary | ICD-10-CM

## 2016-12-07 DIAGNOSIS — R2681 Unsteadiness on feet: Secondary | ICD-10-CM | POA: Diagnosis not present

## 2016-12-07 DIAGNOSIS — I10 Essential (primary) hypertension: Secondary | ICD-10-CM | POA: Diagnosis not present

## 2016-12-07 DIAGNOSIS — J449 Chronic obstructive pulmonary disease, unspecified: Secondary | ICD-10-CM | POA: Diagnosis not present

## 2016-12-07 DIAGNOSIS — R600 Localized edema: Secondary | ICD-10-CM

## 2016-12-07 DIAGNOSIS — M159 Polyosteoarthritis, unspecified: Secondary | ICD-10-CM

## 2016-12-07 DIAGNOSIS — M150 Primary generalized (osteo)arthritis: Secondary | ICD-10-CM

## 2016-12-07 NOTE — Progress Notes (Signed)
Patient ID: MAVRICK MCQUIGG, male   DOB: [birth year], 71 y.o.   MRN: [id number]    DATE:  12/07/2016  Location:     [place] Room Number: 154 B Place of Service: SNF (31)   Extended Emergency Contact Information Primary Emergency Contact: Ruggles,Michael Address: [address]          [place], [place] [address] Johnnette Litter of [place] Phone: [phone] Mobile Phone: [phone] Relation: Significant other  Advanced Directive information Does Patient Have a Medical Advance Directive?: No, Does patient want to make changes to medical advance directive?: No - Patient declined  Chief Complaint  Patient presents with  . Discharge Note    HPI:  71 yo male seen today for d/c from SNF following short term rehab for fall, LLE edema, UTI, staghorn calculus, COPD. He has completed SNF tx and will d/c home with [facility] RN for medication mx, PT/OT for gait training, ADL balance, and transfers. After further discussion, he states he has tried [facility] in the past and prefers to go to o/p PT at [facility] and already has an appt on Mon 3/5th. He has no DME needs as he has all equipment at home.  BRIEF SUMMARY OF STAY: admitted into SNF following hospital stay for UTI, staghorn renal calculus, fall, LLE edema and hx low testosterone/hypothyroidism/psoriasis on Enbrel injections/GERD/anxiety and depression/CAD/PAF on xeralto/hyperlipidemia/BPH/COPD. He was initially started on IV rocephin-->urine cx (+) multiple spp --> abx changed to Cipro and doxy x 8days. CT revealed large renal staghorn calculus. He had LLE edema but duplex US neg for DVT. CT venogram of A/P neg. 2D echo revealed nml EF, biatrial enlargement and dilated IVC. Albumin 2.8; Hgb 12; T bilirubin 1.5; Cr 1.21-->1.05 at d/c.  Today he reports feeling better since SNF admission. Strength slowly returning. No falls. Appetite ok and sleeping well. No nursing concerns.  BPH/lowT - he gets testosterone injections 2 times  per month  Hypothyroidism - stable on levothyroxine.  Psoriasis - stable on enbrel. Followed by derm  DJD - Pain stable on ATC norco and zanaflex  GERD - stable on prilosec. Constipation stable on miralax  Anxiety/depression - mood stable on cymbalta, klonopin and wellbutrin  CAD/PAF - stable on renexa. HR controlled on amiodarone and cardizem CD. He takes xeralto. Followed by cardio  Hyperlipidemia - stable on lipitor  COPD/seasonal allergy - stable on claritin  Hx anemia - stable on iron daily. Hgb 12  Osteoporosis with hx L1 compression fx - stable on prolia injections q 6 mos and Ca/Vit D supplements  Past Medical History:  Diagnosis Date  . A-fib (HCC)    hx.- sinus rhythm now  . Acid reflux   . Acute blood loss anemia   . Anxiety   . Arthritis   . BPH (benign prostatic hyperplasia)   . CAD (coronary artery disease)   . Depression   . Dislocation of internal right hip prosthesis, sequela   . Environmental allergies   . Fracture of wrist 10/23/13   LEFT  . High cholesterol   . HLD (hyperlipidemia)   . Hypertension   . Low testosterone, possible hypogonadism 10/01/2012  . Lumbago   . Metabolic bone disease   . Osteoporosis   . Plaque psoriasis   . Sleep apnea    cpap machine-settings 17  . Thrombocytopenia ([place])   . Thyroid disease    HX GRAVES DISEASE  . Transfusion history    s/p 12'13 hip surgery  . Unsteady  gait   . Unsteady gait   . Vertebral compression fracture (HCC)    L1-wears brace    Past Surgical History:  Procedure Laterality Date  . APPENDECTOMY    . CARDIAC CATHETERIZATION  2013  . HARDWARE REMOVAL  10/05/2012   Procedure: HARDWARE REMOVAL;  Surgeon: [name], MD;  Location: [facility];  Service: Orthopedics;  Laterality: Right;  REMOVING  STRYKER  GAMMA NAIL  . HARDWARE REMOVAL Right 07/03/2013   Procedure: HARDWARE REMOVAL RIGHT TIBIA ;  Surgeon: Rozanna Box, MD;  Location: [facility];  Service: Orthopedics;  Laterality: Right;  .  HIP CLOSED REDUCTION Right 10/15/2013   Procedure: CLOSED MANIPULATION HIP;  Surgeon: [name], MD;  [facility];  Service: Orthopedics;  Laterality: Right;  . INCISION AND DRAINAGE HIP Right 11/16/2013   Procedure: IRRIGATION AND DEBRIDEMENT RIGHT HIP;  Surgeon: [name], MD;  Location: [facility];  Service: Orthopedics;  Laterality: Right;  . JOINT REPLACEMENT     hip-right x2  . KNEE SURGERY Bilateral 2012   Total knee replacements  . ORIF TIBIA FRACTURE Right 02/06/2013   Procedure: OPEN REDUCTION INTERNAL FIXATION (ORIF) TIBIA FRACTURE WITH IM ROD FIBULA;  Surgeon: Rozanna Box, MD;  Location: [facility];  Service: Orthopedics;  Laterality: Right;  . ORIF TIBIA FRACTURE Right 07/03/2013   Procedure: RIGHT TIBIA NON UNION REPAIR ;  Surgeon: Rozanna Box, MD;  Location: [facility];  Service: Orthopedics;  Laterality: Right;  . ORIF WRIST FRACTURE  10/02/2012   Procedure: OPEN REDUCTION INTERNAL FIXATION (ORIF) WRIST FRACTURE;  Surgeon: Roseanne Kaufman, MD;  [facility];  Service: Orthopedics;  Laterality: Right;  WITH   ANTIBIOTIC  CEMENT  . ORIF WRIST FRACTURE Left 10/28/2013   Procedure: OPEN REDUCTION INTERNAL FIXATION (ORIF) WRIST FRACTURE with allograft;  Surgeon: Roseanne Kaufman, MD;  [facility];  Service: Orthopedics;  Laterality: Left;  DVR Plate  . right femur surgery  05/2012  . TONSILLECTOMY    . TOTAL HIP REVISION  10/05/2012   Procedure: TOTAL HIP REVISION;  Surgeon: [name], MD;  [facility];  Service: Orthopedics;  Laterality: Right;  RIGHT TOTAL HIP REVISION  . TOTAL HIP REVISION Right 09/17/2013   Procedure: REVISION RIGHT TOTAL HIP ARTHROPLASTY ;  Surgeon: [name], MD;  [facility];  Service: Orthopedics;  Laterality: Right;  . TOTAL HIP REVISION Right 10/26/2013   Procedure: REVISION RIGHT TOTAL HIP ARTHROPLASTY;  Surgeon: [name], MD;  [facility];  Service: Orthopedics;  Laterality: Right;  . WRIST FRACTURE SURGERY  05/2012     Patient Care Team: Janie Morning, DO as PCP - General (Family Medicine)  Social History   Social History  . Marital status: Significant Other    Spouse name: N/A  . Number of children: N/A  . Years of education: N/A   Occupational History  . Not on file.   Social History Main Topics  . Smoking status: Former Smoker    Quit date: 06/05/2012  . Smokeless tobacco: Never Used  . Alcohol use Yes     Comment: occasional-social  . Drug use: No  . Sexual activity: Not on file   Other Topics Concern  . Not on file   Social History Narrative   [place] 2.5 months, went home Feb 21st slipped and fell on back and developed.  Home PT/OT.  Just started outpatient physical therapy.  Friday night, misstepped.  reports that he quit smoking about 4 years ago. He has never used smokeless tobacco. He reports that he drinks alcohol. He reports that he does not use drugs.  Family History  Problem Relation Age of Onset  . CAD Father 50  . Arthritis Mother    Family Status  Relation Status  . Father   . Mother     Immunization History  Administered Date(s) Administered  . Influenza,inj,Quad PF,36+ Mos 07/05/2013  . PPD Test 03/19/2016, 11/05/2016, 11/18/2016    No Known Allergies  Medications: Patient's Medications  New Prescriptions   No medications on file  Previous Medications   AMIODARONE (PACERONE) 200 MG TABLET    Take 200 mg by mouth daily.   ASCORBIC ACID (VITAMIN C) 1000 MG TABLET    Take 1,000 mg by mouth daily.   ATORVASTATIN (LIPITOR) 80 MG TABLET    Take 80 mg by mouth every evening.    BUPROPION (WELLBUTRIN XL) 300 MG 24 HR TABLET    Take 300 mg by mouth daily with breakfast.    CALCIUM PO    Take 400 mg by mouth 2 (two) times daily.   CHOLECALCIFEROL 4000 UNITS CAPS    Take 4,000 Units by mouth daily.   CLONAZEPAM (KLONOPIN) 1 MG TABLET    Take one tablet by mouth every 6 hours   DENOSUMAB (PROLIA) 60 MG/ML SOLN INJECTION    Inject 60 mg into the skin  every 6 (six) months. Administer in upper arm, thigh, or abdomen   DILTIAZEM (CARDIZEM CD) 240 MG 24 HR CAPSULE    Take 240 mg by mouth every morning.    DULOXETINE (CYMBALTA) 60 MG CAPSULE    Take 60 mg by mouth 2 (two) times daily.    ETANERCEPT (ENBREL) 50 MG/ML INJECTION    Inject 50 mg into the skin 2 (two) times a week. Takes on Tuesday's and Friday's   FERROUS SULFATE 325 (65 FE) MG TABLET    Take 325 mg by mouth daily.    HYDROCODONE-ACETAMINOPHEN (NORCO) 7.5-325 MG TABLET    Take 1 tablet by mouth every 6 (six) hours.   IPRATROPIUM-ALBUTEROL (DUONEB) 0.5-2.5 (3) MG/3ML SOLN    Take 3 mLs by nebulization 2 (two) times daily.   LEVOTHYROXINE (SYNTHROID, LEVOTHROID) 200 MCG TABLET    Take 200 mcg by mouth daily before breakfast.    LORATADINE (CLARITIN) 10 MG TABLET    Take 10 mg by mouth daily.    MULTIPLE VITAMIN (MULTIVITAMIN WITH MINERALS) TABS TABLET    Take 1 tablet by mouth daily.   NITROGLYCERIN (NITROSTAT) 0.4 MG SL TABLET    Place 0.4 mg under the tongue every 5 (five) minutes as needed for chest pain. x3 doses as needed for chest pain   OMEPRAZOLE (PRILOSEC) 40 MG CAPSULE    Take 40 mg by mouth daily.   POLYETHYLENE GLYCOL (MIRALAX / GLYCOLAX) PACKET    Take 17 g by mouth daily as needed for mild constipation.    RANOLAZINE (RANEXA) 500 MG 12 HR TABLET    Take 500 mg by mouth 2 (two) times daily.    RIVAROXABAN (XARELTO) 20 MG TABS TABLET    Take 20 mg by mouth daily with supper.    SACCHAROMYCES BOULARDII (FLORASTOR) 250 MG CAPSULE    Take 250 mg by mouth 2 (two) times daily.   TAMSULOSIN HCL (FLOMAX) 0.4 MG CAPS    Take 0.4 mg by mouth every evening.    TESTOSTERONE CYPIONATE (DEPOTESTOSTERONE CYPIONATE) 200 MG/ML  INJECTION    Inject 232m (113m intramuscularly every 14 days   TIZANIDINE (ZANAFLEX) 6 MG CAPSULE    Take 6 mg by mouth every 6 (six) hours.   Modified Medications   No medications on file  Discontinued Medications   DOXYCYCLINE (VIBRA-TABS) 100 MG TABLET    Take  100 mg by mouth daily.    Review of Systems  Respiratory: Negative for wheezing.   Cardiovascular: Positive for leg swelling.  Musculoskeletal: Positive for arthralgias and gait problem.  Skin: Positive for color change and rash.  All other systems reviewed and are negative.   Vitals:   12/07/16 1049  BP: (!) 114/58  Pulse: (!) 105  Resp: 19  Temp: 97.7 F (36.5 C)  TempSrc: Oral  SpO2: 92%  Weight: 283 lb (128.4 kg)  Height: 5' 8" (1.727 m)   Body mass index is 43.03 kg/m.  Physical Exam  Constitutional: He is oriented to person, place, and time. He appears well-developed.  Frail appearing in NAD, lying in bed  Neurological: He is alert and oriented to person, place, and time.  Skin: Skin is warm and dry.  Chronic b/l LE venous stasis changes  Psychiatric: He has a normal mood and affect. His behavior is normal. Thought content normal.     Labs reviewed: Admission on 11/02/2016, Discharged on 11/05/2016  Component Date Value Ref Range Status  . WBC 11/02/2016 9.2  4.0 - 10.5 K/uL Final  . RBC 11/02/2016 3.93* 4.22 - 5.81 MIL/uL Final  . Hemoglobin 11/02/2016 11.7* 13.0 - 17.0 g/dL Final  . HCT 11/02/2016 34.7* 39.0 - 52.0 % Final  . MCV 11/02/2016 88.3  78.0 - 100.0 fL Final  . MCH 11/02/2016 29.8  26.0 - 34.0 pg Final  . MCHC 11/02/2016 33.7  30.0 - 36.0 g/dL Final  . RDW 11/02/2016 15.9* 11.5 - 15.5 % Final  . Platelets 11/02/2016 257  150 - 400 K/uL Final  . Neutrophils Relative % 11/02/2016 77  % Final  . Neutro Abs 11/02/2016 7.1  1.7 - 7.7 K/uL Final  . Lymphocytes Relative 11/02/2016 12  % Final  . Lymphs Abs 11/02/2016 1.1  0.7 - 4.0 K/uL Final  . Monocytes Relative 11/02/2016 11  % Final  . Monocytes Absolute 11/02/2016 1.0  0.1 - 1.0 K/uL Final  . Eosinophils Relative 11/02/2016 0  % Final  . Eosinophils Absolute 11/02/2016 0.0  0.0 - 0.7 K/uL Final  . Basophils Relative 11/02/2016 0  % Final  . Basophils Absolute 11/02/2016 0.0  0.0 - 0.1 K/uL  Final  . Sodium 11/02/2016 136  135 - 145 mmol/L Final  . Potassium 11/02/2016 3.7  3.5 - 5.1 mmol/L Final  . Chloride 11/02/2016 102  101 - 111 mmol/L Final  . CO2 11/02/2016 25  22 - 32 mmol/L Final  . Glucose, Bld 11/02/2016 113* 65 - 99 mg/dL Final  . BUN 11/02/2016 27* 6 - 20 mg/dL Final  . Creatinine, Ser 11/02/2016 1.21  0.61 - 1.24 mg/dL Final  . Calcium 11/02/2016 8.3* 8.9 - 10.3 mg/dL Final  . Total Protein 11/02/2016 7.2  6.5 - 8.1 g/dL Final  . Albumin 11/02/2016 2.8* 3.5 - 5.0 g/dL Final  . AST 11/02/2016 45* 15 - 41 U/L Final  . ALT 11/02/2016 40  17 - 63 U/L Final  . Alkaline Phosphatase 11/02/2016 86  38 - 126 U/L Final  . Total Bilirubin 11/02/2016 1.5* 0.3 - 1.2 mg/dL Final  . GFR calc non Af AmWyvonnia Lora1/23/2018  58* >60 mL/min Final  . GFR calc Af Amer 11/02/2016 >60  >60 mL/min Final   Comment: (NOTE) The eGFR has been calculated using the CKD EPI equation. This calculation has not been validated in all clinical situations. eGFR's persistently <60 mL/min signify possible Chronic Kidney Disease.   . Anion gap 11/02/2016 9  5 - 15 Final  . Lactic Acid, Venous 11/02/2016 1.02  0.5 - 1.9 mmol/L Final  . Color, Urine 11/02/2016 AMBER* YELLOW Final  . APPearance 11/02/2016 CLOUDY* CLEAR Final  . Specific Gravity, Urine 11/02/2016 1.017  1.005 - 1.030 Final  . pH 11/02/2016 7.0  5.0 - 8.0 Final  . Glucose, UA 11/02/2016 NEGATIVE  NEGATIVE mg/dL Final  . Hgb urine dipstick 11/02/2016 LARGE* NEGATIVE Final  . Bilirubin Urine 11/02/2016 NEGATIVE  NEGATIVE Final  . Ketones, ur 11/02/2016 NEGATIVE  NEGATIVE mg/dL Final  . Protein, ur 11/02/2016 100* NEGATIVE mg/dL Final  . Nitrite 11/02/2016 NEGATIVE  NEGATIVE Final  . Leukocytes, UA 11/02/2016 LARGE* NEGATIVE Final  . RBC / HPF 11/02/2016 TOO NUMEROUS TO COUNT  0 - 5 RBC/hpf Final  . WBC, UA 11/02/2016 TOO NUMEROUS TO COUNT  0 - 5 WBC/hpf Final  . Bacteria, UA 11/02/2016 MANY* NONE SEEN Final  . Squamous Epithelial / LPF  11/02/2016 NONE SEEN  NONE SEEN Final  . Mucous 11/02/2016 PRESENT   Final  . Troponin i, poc 11/02/2016 0.00  0.00 - 0.08 ng/mL Final  . Comment 3 11/02/2016          Final   Comment: Due to the release kinetics of cTnI, a negative result within the first hours of the onset of symptoms does not rule out myocardial infarction with certainty. If myocardial infarction is still suspected, repeat the test at appropriate intervals.   . Prothrombin Time 11/02/2016 27.0* 11.4 - 15.2 seconds Final  . INR 11/02/2016 2.45   Final  . B Natriuretic Peptide 11/02/2016 204.9* 0.0 - 100.0 pg/mL Final  . Specimen Description 11/02/2016 URINE, CLEAN CATCH   Final  . Special Requests 11/02/2016 NONE   Final  . Culture 11/02/2016 MULTIPLE SPECIES PRESENT, SUGGEST RECOLLECTION*  Final  . Report Status 11/02/2016 11/04/2016 FINAL   Final  . Lactic Acid, Venous 11/03/2016 0.67  0.5 - 1.9 mmol/L Final  . Total CK 11/02/2016 199  49 - 397 U/L Final  . Creatinine, Urine 11/03/2016 99.11  mg/dL Final  . Sodium, Ur 11/03/2016 30  mmol/L Final  . Sodium 11/03/2016 134* 135 - 145 mmol/L Final  . Potassium 11/03/2016 3.5  3.5 - 5.1 mmol/L Final  . Chloride 11/03/2016 100* 101 - 111 mmol/L Final  . CO2 11/03/2016 26  22 - 32 mmol/L Final  . Glucose, Bld 11/03/2016 121* 65 - 99 mg/dL Final  . BUN 11/03/2016 23* 6 - 20 mg/dL Final  . Creatinine, Ser 11/03/2016 1.12  0.61 - 1.24 mg/dL Final  . Calcium 11/03/2016 8.3* 8.9 - 10.3 mg/dL Final  . GFR calc non Af Amer 11/03/2016 >60  >60 mL/min Final  . GFR calc Af Amer 11/03/2016 >60  >60 mL/min Final   Comment: (NOTE) The eGFR has been calculated using the CKD EPI equation. This calculation has not been validated in all clinical situations. eGFR's persistently <60 mL/min signify possible Chronic Kidney Disease.   . Anion gap 11/03/2016 8  5 - 15 Final  . WBC 11/03/2016 9.7  4.0 - 10.5 K/uL Final  . RBC 11/03/2016 4.29  4.22 - 5.81 MIL/uL Final  .  Hemoglobin  11/03/2016 12.8* 13.0 - 17.0 g/dL Final  . HCT 11/03/2016 37.9* 39.0 - 52.0 % Final  . MCV 11/03/2016 88.3  78.0 - 100.0 fL Final  . MCH 11/03/2016 29.8  26.0 - 34.0 pg Final  . MCHC 11/03/2016 33.8  30.0 - 36.0 g/dL Final  . RDW 11/03/2016 16.0* 11.5 - 15.5 % Final  . Platelets 11/03/2016 273  150 - 400 K/uL Final  . BP 11/03/2016 144/75  mmHg Final  . Sodium 11/04/2016 135  135 - 145 mmol/L Final  . Potassium 11/04/2016 5.0  3.5 - 5.1 mmol/L Final   Comment: DELTA CHECK NOTED REPEATED TO VERIFY MODERATE HEMOLYSIS   . Chloride 11/04/2016 101  101 - 111 mmol/L Final  . CO2 11/04/2016 26  22 - 32 mmol/L Final  . Glucose, Bld 11/04/2016 115* 65 - 99 mg/dL Final  . BUN 11/04/2016 21* 6 - 20 mg/dL Final  . Creatinine, Ser 11/04/2016 0.98  0.61 - 1.24 mg/dL Final  . Calcium 11/04/2016 8.3* 8.9 - 10.3 mg/dL Final  . GFR calc non Af Amer 11/04/2016 >60  >60 mL/min Final  . GFR calc Af Amer 11/04/2016 >60  >60 mL/min Final   Comment: (NOTE) The eGFR has been calculated using the CKD EPI equation. This calculation has not been validated in all clinical situations. eGFR's persistently <60 mL/min signify possible Chronic Kidney Disease.   . Anion gap 11/04/2016 8  5 - 15 Final  . WBC 11/04/2016 9.7  4.0 - 10.5 K/uL Final  . RBC 11/04/2016 4.20* 4.22 - 5.81 MIL/uL Final  . Hemoglobin 11/04/2016 12.7* 13.0 - 17.0 g/dL Final  . HCT 11/04/2016 37.3* 39.0 - 52.0 % Final  . MCV 11/04/2016 88.8  78.0 - 100.0 fL Final  . MCH 11/04/2016 30.2  26.0 - 34.0 pg Final  . MCHC 11/04/2016 34.0  30.0 - 36.0 g/dL Final  . RDW 11/04/2016 16.1* 11.5 - 15.5 % Final  . Platelets 11/04/2016 293  150 - 400 K/uL Final  . Sodium 11/05/2016 137  135 - 145 mmol/L Final  . Potassium 11/05/2016 3.7  3.5 - 5.1 mmol/L Final   Comment: DELTA CHECK NOTED REPEATED TO VERIFY   . Chloride 11/05/2016 103  101 - 111 mmol/L Final  . CO2 11/05/2016 27  22 - 32 mmol/L Final  . Glucose, Bld 11/05/2016 113* 65 - 99 mg/dL  Final  . BUN 11/05/2016 22* 6 - 20 mg/dL Final  . Creatinine, Ser 11/05/2016 1.05  0.61 - 1.24 mg/dL Final  . Calcium 11/05/2016 8.6* 8.9 - 10.3 mg/dL Final  . GFR calc non Af Amer 11/05/2016 >60  >60 mL/min Final  . GFR calc Af Amer 11/05/2016 >60  >60 mL/min Final   Comment: (NOTE) The eGFR has been calculated using the CKD EPI equation. This calculation has not been validated in all clinical situations. eGFR's persistently <60 mL/min signify possible Chronic Kidney Disease.   . Anion gap 11/05/2016 7  5 - 15 Final  . WBC 11/05/2016 8.0  4.0 - 10.5 K/uL Final  . RBC 11/05/2016 4.09* 4.22 - 5.81 MIL/uL Final  . Hemoglobin 11/05/2016 12.0* 13.0 - 17.0 g/dL Final  . HCT 11/05/2016 36.2* 39.0 - 52.0 % Final  . MCV 11/05/2016 88.5  78.0 - 100.0 fL Final  . MCH 11/05/2016 29.3  26.0 - 34.0 pg Final  . MCHC 11/05/2016 33.1  30.0 - 36.0 g/dL Final  . RDW 11/05/2016 15.9* 11.5 - 15.5 % Final  . Platelets  11/05/2016 296  150 - 400 K/uL Final    No results found.   Assessment/Plan   ICD-9-CM ICD-10-CM   1. Physical deconditioning 799.3 R53.81   2. Unsteady gait 781.2 R26.81   3. Chronic obstructive pulmonary disease, unspecified COPD type ([place]) 496 J44.9   4. Staghorn calculus 592.0 N20.0   5. Protein-calorie malnutrition, severe ([place]) 262 E43   6. PAF (paroxysmal atrial fibrillation) (HCC) 427.31 I48.0   7. Essential hypertension 401.9 I10   8. Primary osteoarthritis involving multiple joints 715.09 M15.0   9. Major depression, chronic 296.20 F32.9   10. Leg edema, left 782.3 R60.0     Patient is being discharged with home health services:  NONE. Pt declined. He will f/u with o/p PT at [facility]  Patient is being discharged with the following durable medical equipment:  NONE  Patient has been advised to f/u with their PCP in 1-2 weeks to bring them up to date on their rehab stay.  They were provided with a 30 day supply of scripts for prescription medications and  refills must be obtained from their PCP.  TIME SPENT (MINUTES): [place]. Perlie Gold  [facility] [address] [place], [place] [address] [phone] Cell (Monday-Friday 8 AM - 5 PM) [phone] After 5 PM and follow prompts

## 2016-12-08 DIAGNOSIS — E43 Unspecified severe protein-calorie malnutrition: Secondary | ICD-10-CM | POA: Insufficient documentation

## 2016-12-08 DIAGNOSIS — J449 Chronic obstructive pulmonary disease, unspecified: Secondary | ICD-10-CM | POA: Insufficient documentation

## 2016-12-11 ENCOUNTER — Emergency Department (HOSPITAL_COMMUNITY)
Admission: EM | Admit: 2016-12-11 | Discharge: 2016-12-11 | Disposition: A | Discharge status: Home / Self Care | Payer: Medicare Other | Attending: Emergency Medicine | Admitting: Emergency Medicine

## 2016-12-11 ENCOUNTER — Emergency Department (HOSPITAL_COMMUNITY): Payer: Medicare Other

## 2016-12-11 ENCOUNTER — Encounter (HOSPITAL_COMMUNITY): Payer: Self-pay | Admitting: Emergency Medicine

## 2016-12-11 DIAGNOSIS — J449 Chronic obstructive pulmonary disease, unspecified: Secondary | ICD-10-CM | POA: Diagnosis not present

## 2016-12-11 DIAGNOSIS — Z7902 Long term (current) use of antithrombotics/antiplatelets: Secondary | ICD-10-CM | POA: Diagnosis not present

## 2016-12-11 DIAGNOSIS — Z87891 Personal history of nicotine dependence: Secondary | ICD-10-CM | POA: Insufficient documentation

## 2016-12-11 DIAGNOSIS — R531 Weakness: Secondary | ICD-10-CM | POA: Diagnosis present

## 2016-12-11 DIAGNOSIS — Z96641 Presence of right artificial hip joint: Secondary | ICD-10-CM | POA: Insufficient documentation

## 2016-12-11 DIAGNOSIS — R52 Pain, unspecified: Secondary | ICD-10-CM

## 2016-12-11 DIAGNOSIS — R0602 Shortness of breath: Secondary | ICD-10-CM | POA: Diagnosis not present

## 2016-12-11 DIAGNOSIS — N200 Calculus of kidney: Secondary | ICD-10-CM | POA: Insufficient documentation

## 2016-12-11 DIAGNOSIS — N390 Urinary tract infection, site not specified: Secondary | ICD-10-CM | POA: Diagnosis not present

## 2016-12-11 DIAGNOSIS — R319 Hematuria, unspecified: Secondary | ICD-10-CM | POA: Diagnosis not present

## 2016-12-11 DIAGNOSIS — Z955 Presence of coronary angioplasty implant and graft: Secondary | ICD-10-CM | POA: Diagnosis not present

## 2016-12-11 DIAGNOSIS — I10 Essential (primary) hypertension: Secondary | ICD-10-CM | POA: Diagnosis not present

## 2016-12-11 DIAGNOSIS — E039 Hypothyroidism, unspecified: Secondary | ICD-10-CM | POA: Insufficient documentation

## 2016-12-11 DIAGNOSIS — I2510 Atherosclerotic heart disease of native coronary artery without angina pectoris: Secondary | ICD-10-CM | POA: Diagnosis not present

## 2016-12-11 DIAGNOSIS — Z79899 Other long term (current) drug therapy: Secondary | ICD-10-CM | POA: Insufficient documentation

## 2016-12-11 LAB — URINALYSIS, ROUTINE W REFLEX MICROSCOPIC
Bilirubin Urine: NEGATIVE
Glucose, UA: NEGATIVE mg/dL
Ketones, ur: NEGATIVE mg/dL
NITRITE: NEGATIVE
Protein, ur: 30 mg/dL — AB
SPECIFIC GRAVITY, URINE: 1.014 (ref 1.005–1.030)
pH: 6 (ref 5.0–8.0)

## 2016-12-11 LAB — BASIC METABOLIC PANEL
Anion gap: 7 (ref 5–15)
BUN: 19 mg/dL (ref 6–20)
CHLORIDE: 101 mmol/L (ref 101–111)
CO2: 24 mmol/L (ref 22–32)
Calcium: 8 mg/dL — ABNORMAL LOW (ref 8.9–10.3)
Creatinine, Ser: 0.87 mg/dL (ref 0.61–1.24)
GFR calc Af Amer: >60 mL/min (ref >60)
GFR calc non Af Amer: >60 mL/min (ref >60)
GLUCOSE: 121 mg/dL — AB (ref 65–99)
POTASSIUM: 3.7 mmol/L (ref 3.5–5.1)
SODIUM: 132 mmol/L — AB (ref 135–145)

## 2016-12-11 LAB — I-STAT TROPONIN, ED: Troponin i, poc: 0 ng/mL (ref 0.00–0.08)

## 2016-12-11 LAB — CBC
HEMATOCRIT: 34.5 % — AB (ref 39.0–52.0)
Hemoglobin: 11.2 g/dL — ABNORMAL LOW (ref 13.0–17.0)
MCH: 27.5 pg (ref 26.0–34.0)
MCHC: 32.5 g/dL (ref 30.0–36.0)
MCV: 84.6 fL (ref 78.0–100.0)
PLATELETS: 314 10*3/uL (ref 150–400)
RBC: 4.08 MIL/uL — ABNORMAL LOW (ref 4.22–5.81)
RDW: 15 % (ref 11.5–15.5)
WBC: 6.4 10*3/uL (ref 4.0–10.5)

## 2016-12-11 LAB — BRAIN NATRIURETIC PEPTIDE: B Natriuretic Peptide: 94.5 pg/mL (ref 0.0–100.0)

## 2016-12-11 LAB — D-DIMER, QUANTITATIVE: D-Dimer, Quant: 5.9 ug/mL-FEU — ABNORMAL HIGH (ref 0.00–0.50)

## 2016-12-11 MED ORDER — IOPAMIDOL (ISOVUE-370) INJECTION 76%
INTRAVENOUS | Status: AC
  Administered 2016-12-11: 100 mL via INTRAVENOUS
  Filled 2016-12-11: qty 100

## 2016-12-11 MED ORDER — DEXTROSE 5 % IV SOLN
1.0000 g | Freq: Once | INTRAVENOUS | Status: AC
  Administered 2016-12-11: 1 g via INTRAVENOUS
  Filled 2016-12-11: qty 10

## 2016-12-11 MED ORDER — CEFDINIR 300 MG PO CAPS: 300.0000 mg | ORAL_CAPSULE | Freq: Two times a day (BID) | ORAL | 0 refills | Status: DC

## 2016-12-11 NOTE — ED Notes (Signed)
Bed: [id number] Expected date:  Expected time:  Means of arrival:  Comments: Hold for triage-[place]

## 2016-12-11 NOTE — ED Provider Notes (Addendum)
Pt signed out to check ct chest.   Pt is already on anticoag therapy for afib. No cp or sob. Ct is neg for PE.   Patients labs do show a recurrent uti - prior cx showed many species, so not helpful for abx therapy today.  Rocephin iv. Urine cultured.   Discussed disposition including offering admission.  Pt states he feels fine, requests d/c.    Discussed recurrent uti in setting staghorn/renal calculus - pt indicates he saw urologist/Tannenbaum this past Wednesday, and plan was for 6 month f/u.  Given recurrent uti, discussed w pt need for close urology f/u this week.   Pt afeb. Denies chills or sweats. Normal appetite. No nv. Pt drinking fluids in ED.   Pt currently appears stable for d/c.  Given that pt already on amiodarone, and QTC borderline long, will avoid quinolone and instead give cefdinir bid for outpt tx.         Lajean Saver, MD 12/11/16 [phone]

## 2016-12-11 NOTE — Discharge Instructions (Signed)
It was our pleasure to provide your ER care today - we hope that you feel better.  Drink plenty of fluids.  Your urine tests show a urine infection - a urine culture was sent the results of which will be back in 2-3 days - have your doctor/urologist follow up on the culture result then.   Take antibiotic (cefdinir) as prescribed.   Your recent ct scan showed a kidney stone/staghorn calculus.  Discuss this, as well as recurrent urine infection, with urologist at follow up this week - call office Monday AM to arrange appointment.   Return to ER if worse, new symptoms, fevers, vomiting, weak/fainting, other concern.

## 2016-12-11 NOTE — ED Triage Notes (Signed)
Per pt, states he was diagnosed with UTI and treated a few weeks ago-states he has been having symptoms for a few days now-urine dark, possible blood-called urologist but "they didn't do anything"

## 2016-12-11 NOTE — ED Notes (Signed)
Patient transported to X-ray 

## 2016-12-11 NOTE — ED Provider Notes (Signed)
[facility] DEPT Provider Note   CSN: [id number] Arrival date & time: 12/11/16  1334     History   Chief Complaint Chief Complaint  Patient presents with  . Weakness    HPI [name] is a 71 y.o. male.  HPI Patient presents to the emergency room for evaluation of weakness the patient was recently admitted to the hospital on January 23 of this year for a complicated UTI associated with weakness and falls.  Patient was admitted to the hospital because he had increasing urinary frequency hematuria.  He was diagnosed with a UTI and started on antibiotics. Patient also was noted a large staghorn calculus. They recommended following up with urology as an outpatient. The patient was discharged to a nursing home to continue physical rehabilitation.  He was discharged from nursing home on February 27.  Patient states since going home he has felt weaker than expected. He has difficulty walking with his walker more than 20 feet or so. He's had a few falls from feeling weak. He does have some discomfort in his left leg.  Patient is concerned that he be developing a new urinary tract infection. He is here for evaluation because of his persistent weakness. Past Medical History:  Diagnosis Date  . A-fib (HCC)    hx.- sinus rhythm now  . Acid reflux   . Acute blood loss anemia   . Anxiety   . Arthritis   . BPH (benign prostatic hyperplasia)   . CAD (coronary artery disease)   . Depression   . Dislocation of internal right hip prosthesis, sequela   . Environmental allergies   . Fracture of wrist 10/23/13   LEFT  . High cholesterol   . HLD (hyperlipidemia)   . Hypertension   . Low testosterone, possible hypogonadism 10/01/2012  . Lumbago   . Metabolic bone disease   . Osteoporosis   . Plaque psoriasis   . Sleep apnea    cpap machine-settings 17  . Thrombocytopenia ([place])   . Thyroid disease    HX GRAVES DISEASE  . Transfusion history    s/p 12'13 hip surgery  . Unsteady gait     . Unsteady gait   . Vertebral compression fracture (HCC)    L1-wears brace    Patient Active Problem List   Diagnosis Date Noted  . Protein-calorie malnutrition, severe ([place]) 12/08/2016  . Chronic obstructive pulmonary disease ([place]) 12/08/2016  . Staghorn renal calculus 11/05/2016  . Hyperlipidemia 03/22/2016  . Wound drainage right hip 11/15/2013  . Expected blood loss anemia 09/18/2013  . Hyponatremia 09/18/2013  . S/P right TH revision 09/17/2013  . Nonunion, fracture, Right tibia  07/03/2013  . UTI (urinary tract infection) 07/03/2013  . Pathological fracture due to osteoporosis with delayed healing 04/24/2013  . Osteoporosis with fracture 04/24/2013  . Rash and nonspecific skin eruption 03/08/2013  . Syncope 02/21/2013  . PAF (paroxysmal atrial fibrillation) ([place]) 02/21/2013  . Chronic anticoagulation 02/21/2013  . Fall 02/08/2013  . Physical deconditioning 02/08/2013  . Low back pain radiating to both legs 02/08/2013  . Compression fracture of L1 lumbar vertebra (HCC) 02/08/2013  . Right tibial fracture 02/05/2013  . Obesity, unspecified 02/05/2013  . Osteomyelitis of right wrist ([place]) 11/02/2012  . Anemia 10/06/2012  . HTN (hypertension) 10/03/2012  . Psoriasis 10/03/2012  . Dyslipidemia 10/03/2012  . Vitamin D deficiency 10/03/2012  . GERD (gastroesophageal reflux disease) 10/03/2012  . Hyperglycemia 10/03/2012  . Anxiety 10/03/2012  . Depression 10/03/2012  . CAD (coronary  artery disease) 10/03/2012  . DJD (degenerative joint disease) 10/03/2012  . Chronic gingivitis 10/03/2012  . Low testosterone, possible hypogonadism 10/01/2012  . Normocytic anemia 09/30/2012  . Right femoral fracture ([place]) 09/29/2012  . Right forearm fracture 09/29/2012  . Atrial fibrillation ([place]) 09/29/2012  . Hypothyroidism 09/29/2012  . History of recurrent UTIs 09/29/2012    Past Surgical History:  Procedure Laterality Date  . APPENDECTOMY    . CARDIAC CATHETERIZATION  2013   . HARDWARE REMOVAL  10/05/2012   Procedure: HARDWARE REMOVAL;  Surgeon: [name], MD;  Location: [facility];  Service: Orthopedics;  Laterality: Right;  REMOVING  STRYKER  GAMMA NAIL  . HARDWARE REMOVAL Right 07/03/2013   Procedure: HARDWARE REMOVAL RIGHT TIBIA ;  Surgeon: Rozanna Box, MD;  Location: [facility];  Service: Orthopedics;  Laterality: Right;  . HIP CLOSED REDUCTION Right 10/15/2013   Procedure: CLOSED MANIPULATION HIP;  Surgeon: [name], MD;  Location: [facility];  Service: Orthopedics;  Laterality: Right;  . INCISION AND DRAINAGE HIP Right 11/16/2013   Procedure: IRRIGATION AND DEBRIDEMENT RIGHT HIP;  Surgeon: [name], MD;  Location: [facility];  Service: Orthopedics;  Laterality: Right;  . JOINT REPLACEMENT     hip-right x2  . KNEE SURGERY Bilateral 2012   Total knee replacements  . ORIF TIBIA FRACTURE Right 02/06/2013   Procedure: OPEN REDUCTION INTERNAL FIXATION (ORIF) TIBIA FRACTURE WITH IM ROD FIBULA;  Surgeon: Rozanna Box, MD;  Location: [facility];  Service: Orthopedics;  Laterality: Right;  . ORIF TIBIA FRACTURE Right 07/03/2013   Procedure: RIGHT TIBIA NON UNION REPAIR ;  Surgeon: Rozanna Box, MD;  Location: [facility];  Service: Orthopedics;  Laterality: Right;  . ORIF WRIST FRACTURE  10/02/2012   Procedure: OPEN REDUCTION INTERNAL FIXATION (ORIF) WRIST FRACTURE;  Surgeon: Roseanne Kaufman, MD;  Location: [facility];  Service: Orthopedics;  Laterality: Right;  WITH   ANTIBIOTIC  CEMENT  . ORIF WRIST FRACTURE Left 10/28/2013   Procedure: OPEN REDUCTION INTERNAL FIXATION (ORIF) WRIST FRACTURE with allograft;  Surgeon: Roseanne Kaufman, MD;  Location: [facility];  Service: Orthopedics;  Laterality: Left;  DVR Plate  . right femur surgery  05/2012  . TONSILLECTOMY    . TOTAL HIP REVISION  10/05/2012   Procedure: TOTAL HIP REVISION;  Surgeon: [name], MD;  Location: [facility];  Service: Orthopedics;  Laterality: Right;  RIGHT TOTAL HIP REVISION  . TOTAL HIP REVISION Right 09/17/2013    Procedure: REVISION RIGHT TOTAL HIP ARTHROPLASTY ;  Surgeon: [name], MD;  Location[facility];  Service: Orthopedics;  Laterality: Right;  . TOTAL HIP REVISION Right 10/26/2013   Procedure: REVISION RIGHT TOTAL HIP ARTHROPLASTY;  Surgeon: [name], MD;  Location: [facility];  Service: Orthopedics;  Laterality: Right;  . WRIST FRACTURE SURGERY  05/2012       Home Medications    Prior to Admission medications   Medication Sig Start Date End Date Taking? Authorizing Provider  amiodarone (PACERONE) 200 MG tablet Take 200 mg by mouth daily.    Historical Provider, MD  Ascorbic Acid (VITAMIN C) 1000 MG tablet Take 1,000 mg by mouth daily.    Historical Provider, MD  atorvastatin (LIPITOR) 80 MG tablet Take 80 mg by mouth every evening.     Historical Provider, MD  buPROPion (WELLBUTRIN XL) 300 MG 24 hr tablet Take 300 mg by mouth daily with breakfast.     Historical Provider, MD  CALCIUM PO Take 400  mg by mouth 2 (two) times daily.    Historical Provider, MD  Cholecalciferol 4000 units CAPS Take 4,000 Units by mouth daily.    Historical Provider, MD  clonazePAM (KLONOPIN) 1 MG tablet Take one tablet by mouth every 6 hours 11/15/16   Tiffany L Reed, DO  denosumab (PROLIA) 60 MG/ML SOLN injection Inject 60 mg into the skin every 6 (six) months. Administer in upper arm, thigh, or abdomen    Historical Provider, MD  diltiazem (CARDIZEM CD) 240 MG 24 hr capsule Take 240 mg by mouth every morning.  05/19/13   Historical Provider, MD  DULoxetine (CYMBALTA) 60 MG capsule Take 60 mg by mouth 2 (two) times daily.     Historical Provider, MD  etanercept (ENBREL) 50 MG/ML injection Inject 50 mg into the skin 2 (two) times a week. Takes on Tuesday's and Friday's    Historical Provider, MD  ferrous sulfate 325 (65 FE) MG tablet Take 325 mg by mouth daily.     Historical Provider, MD  HYDROcodone-acetaminophen (NORCO) 7.5-325 MG tablet Take 1 tablet by mouth every 6 (six) hours. 11/05/16   Debbe Odea, MD   ipratropium-albuterol (DUONEB) 0.5-2.5 (3) MG/3ML SOLN Take 3 mLs by nebulization 2 (two) times daily.    Historical Provider, MD  levothyroxine (SYNTHROID, LEVOTHROID) 200 MCG tablet Take 200 mcg by mouth daily before breakfast.     Historical Provider, MD  loratadine (CLARITIN) 10 MG tablet Take 10 mg by mouth daily.     Historical Provider, MD  Multiple Vitamin (MULTIVITAMIN WITH MINERALS) TABS tablet Take 1 tablet by mouth daily.    Historical Provider, MD  nitroGLYCERIN (NITROSTAT) 0.4 MG SL tablet Place 0.4 mg under the tongue every 5 (five) minutes as needed for chest pain. x3 doses as needed for chest pain    Historical Provider, MD  omeprazole (PRILOSEC) 40 MG capsule Take 40 mg by mouth daily.    Historical Provider, MD  polyethylene glycol (MIRALAX / GLYCOLAX) packet Take 17 g by mouth daily as needed for mild constipation.     Historical Provider, MD  ranolazine (RANEXA) 500 MG 12 hr tablet Take 500 mg by mouth 2 (two) times daily.     Historical Provider, MD  rivaroxaban (XARELTO) 20 MG TABS tablet Take 20 mg by mouth daily with supper.     Historical Provider, MD  saccharomyces boulardii (FLORASTOR) 250 MG capsule Take 250 mg by mouth 2 (two) times daily.    Historical Provider, MD  Tamsulosin HCl (FLOMAX) 0.4 MG CAPS Take 0.4 mg by mouth every evening.     Historical Provider, MD  testosterone cypionate (DEPOTESTOSTERONE CYPIONATE) 200 MG/ML injection Inject 200mg  (44ml) intramuscularly every 14 days 03/30/16   Lauree Chandler, NP  tizanidine (ZANAFLEX) 6 MG capsule Take 6 mg by mouth every 6 (six) hours.     Historical Provider, MD    Family History Family History  Problem Relation Age of Onset  . CAD Father 75  . Arthritis Mother     Social History Social History  Substance Use Topics  . Smoking status: Former Smoker    Quit date: 06/05/2012  . Smokeless tobacco: Never Used  . Alcohol use Yes     Comment: occasional-social     Allergies   Patient has no known  allergies.   Review of Systems Review of Systems  All other systems reviewed and are negative.    Physical Exam Updated Vital Signs BP 130/75 (BP Location: Left Arm)   Pulse  92   Temp 98.3 F (36.8 C) (Oral)   Resp 18   SpO2 99%   Physical Exam  Constitutional: No distress.  HENT:  Head: Normocephalic and atraumatic.  Right Ear: External ear normal.  Left Ear: External ear normal.  Eyes: Conjunctivae are normal. Right eye exhibits no discharge. Left eye exhibits no discharge. No scleral icterus.  Neck: Neck supple. No tracheal deviation present.  Cardiovascular: Normal rate, regular rhythm and intact distal pulses.   Pulmonary/Chest: Effort normal. No stridor. No respiratory distress. He has no wheezes. He has rales.  Crackles in the lower lobes right greater than left  Abdominal: Soft. Bowel sounds are normal. He exhibits no distension. There is no tenderness. There is no rebound and no guarding.  Musculoskeletal: He exhibits edema (mild edema bilateral lower extremities). He exhibits no tenderness.  Neurological: He is alert. He has normal strength. No cranial nerve deficit (no facial droop, extraocular movements intact, no slurred speech) or sensory deficit. He exhibits normal muscle tone. He displays no seizure activity. Coordination normal.  Skin: Skin is warm and dry. No rash noted.  Psychiatric: He has a normal mood and affect.  Nursing note and vitals reviewed.    ED Treatments / Results  Labs (all labs ordered are listed, but only abnormal results are displayed) Labs Reviewed  URINALYSIS, ROUTINE W REFLEX MICROSCOPIC - Abnormal; Notable for the following:       Result Value   Color, Urine AMBER (*)    APPearance CLOUDY (*)    Hgb urine dipstick LARGE (*)    Protein, ur 30 (*)    Leukocytes, UA LARGE (*)    Bacteria, UA MANY (*)    Squamous Epithelial / LPF 0-5 (*)    All other components within normal limits  BASIC METABOLIC PANEL - Abnormal; Notable for  the following:    Sodium 132 (*)    Glucose, Bld 121 (*)    Calcium 8.0 (*)    All other components within normal limits  CBC - Abnormal; Notable for the following:    RBC 4.08 (*)    Hemoglobin 11.2 (*)    HCT 34.5 (*)    All other components within normal limits  D-DIMER, QUANTITATIVE (NOT AT [facility]) - Abnormal; Notable for the following:    D-Dimer, Quant 5.90 (*)    All other components within normal limits  BRAIN NATRIURETIC PEPTIDE  I-STAT TROPOININ, ED   Dg Chest 2 View  Result Date: 12/11/2016 CLINICAL DATA:  Shortness of breath for 2 days. EXAM: CHEST  2 VIEW COMPARISON:  November 02, 2016 FINDINGS: Increased density projected over the anterior right fifth and sixth ribs may represent healed rib fractures. No other bony abnormalities. The heart, hila, and mediastinum are normal. No nodules or masses. No focal infiltrate. No other acute abnormalities. IMPRESSION: No active cardiopulmonary disease. Electronically Signed   By: Dorise Bullion III M.D   On: 12/11/2016 16:47     Procedures Procedures (including critical care time)  Medications Ordered in ED Medications  iopamidol (ISOVUE-370) 76 % injection (not administered)     Initial Impression / Assessment and Plan / ED Course  I have reviewed the triage vital signs and the nursing notes.  Pertinent labs & imaging results that were available during my care of the patient were reviewed by me and considered in my medical decision making (see chart for details).  Clinical Course as of Dec 11 1649  Sat Dec 11, 2016  1515 Doppler studies  were done at the nursing facility according to their notes. No history of DVT.  [JK]    Clinical Course User Index [JK] Dorie Rank, MD    Lab tests show persistent UTI and an elevated d dimer.  Will ct chest to evaluate further.  Reviewed previous urine culture and it showed multiple speciments.  Will give a dose of rocephin and recheck urine culture while waiting for CT scan.   Will  turn over case to Dr Ashok Cordia pending CT scan  Final Clinical Impressions(s) / ED Diagnoses   Final diagnoses:  Urinary tract infection with hematuria, site unspecified      Dorie Rank, MD 12/12/16 [phone]

## 2016-12-16 LAB — URINE CULTURE: Culture: >=100000 — AB

## 2016-12-17 ENCOUNTER — Telehealth: Payer: Self-pay | Admitting: *Deleted

## 2016-12-17 NOTE — Telephone Encounter (Signed)
Post ED Visit - Positive Culture Follow-up  Culture report reviewed by antimicrobial stewardship pharmacist:  []  Elenor Quinones, Pharm.D. []  Heide Guile, Pharm.D., BCPS []  Parks Neptune, Pharm.D. []  Alycia Rossetti, Pharm.D., BCPS []  [place], Pharm.D., BCPS, AAHIVP [x]  [name], Pharm.D., BCPS, AAHIVP []  Milus Glazier, Pharm.D. []  Rob Evette Doffing, Pharm.D.  Positive urine culture Treated with Cefdinir, organism sensitive to the same and no further patient follow-up is required at this time.  Harlon Flor [facility] 12/17/2016, 1:17 PM

## 2017-01-10 ENCOUNTER — Other Ambulatory Visit: Payer: Self-pay | Admitting: Urology

## 2017-02-02 NOTE — Patient Instructions (Addendum)
KADAN MILLSTEIN  02/02/2017   Your procedure is scheduled on: 02/08/2017    Report to [facility] Main  Entrance [facility] to 3rd Floor at 0530am.       Call this number if you have problems the morning of surgery [phone]    Remember: ONLY 1 PERSON MAY GO WITH YOU TO SHORT STAY TO GET  READY MORNING OF YOUR SURGERY.  Do not eat food or drink liquids :After Midnight.     Take these medicines the morning of surgery with A SIP OF WATER: Amiodarone ( Pacerone), Wellbutrin, clonazepam if needed , , synthroid, Diltiazem ( Cardizem), Prilosec, Ranexa, Hydrocodone if needed, claritin,  Cymbalta                                 You may not have any metal on your body including hair pins and              piercings  Do not wear jewelry, lotions, powders or perfumes, deodorant                        Men may shave face and neck.   Do not bring valuables to the hospital. [place].  Contacts, dentures or bridgework may not be worn into surgery.  Leave suitcase in the car. After surgery it may be brought to your room.                     Please read over the following fact sheets you were given: _____________________________________________________________________             [facility] - Preparing for Surgery Before surgery, you can play an important role.  Because skin is not sterile, your skin needs to be as free of germs as possible.  You can reduce the number of germs on your skin by washing with CHG (chlorahexidine gluconate) soap before surgery.  CHG is an antiseptic cleaner which kills germs and bonds with the skin to continue killing germs even after washing. Please DO NOT use if you have an allergy to CHG or antibacterial soaps.  If your skin becomes reddened/irritated stop using the CHG and inform your nurse when you arrive at Short Stay. Do not shave (including legs and underarms) for at  least 48 hours prior to the first CHG shower.  You may shave your face/neck. Please follow these instructions carefully:  1.  Shower with CHG Soap the night before surgery and the  morning of Surgery.  2.  If you choose to wash your hair, wash your hair first as usual with your  normal  shampoo.  3.  After you shampoo, rinse your hair and body thoroughly to remove the  shampoo.                           4.  Use CHG as you would any other liquid soap.  You can apply chg directly  to the skin and wash                       Gently with a scrungie or clean  washcloth.  5.  Apply the CHG Soap to your body ONLY FROM THE NECK DOWN.   Do not use on face/ open                           Wound or open sores. Avoid contact with eyes, ears mouth and genitals (private parts).                       Wash face,  Genitals (private parts) with your normal soap.             6.  Wash thoroughly, paying special attention to the area where your surgery  will be performed.  7.  Thoroughly rinse your body with warm water from the neck down.  8.  DO NOT shower/wash with your normal soap after using and rinsing off  the CHG Soap.                9.  Pat yourself dry with a clean towel.            10.  Wear clean pajamas.            11.  Place clean sheets on your bed the night of your first shower and do not  sleep with pets. Day of Surgery : Do not apply any lotions/deodorants the morning of surgery.  Please wear clean clothes to the hospital/surgery center.  FAILURE TO FOLLOW THESE INSTRUCTIONS MAY RESULT IN THE CANCELLATION OF YOUR SURGERY PATIENT SIGNATURE_________________________________  NURSE SIGNATURE__________________________________  ________________________________________________________________________

## 2017-02-07 ENCOUNTER — Encounter (HOSPITAL_COMMUNITY): Payer: Self-pay

## 2017-02-07 ENCOUNTER — Encounter (HOSPITAL_COMMUNITY)
Admission: RE | Admit: 2017-02-07 | Discharge: 2017-02-07 | Disposition: A | Discharge status: Home / Self Care | Payer: Medicare Other | Source: Ambulatory Visit | Attending: Urology | Admitting: Urology

## 2017-02-07 DIAGNOSIS — R310 Gross hematuria: Secondary | ICD-10-CM | POA: Diagnosis present

## 2017-02-07 DIAGNOSIS — Z9989 Dependence on other enabling machines and devices: Secondary | ICD-10-CM | POA: Diagnosis not present

## 2017-02-07 DIAGNOSIS — F329 Major depressive disorder, single episode, unspecified: Secondary | ICD-10-CM | POA: Diagnosis not present

## 2017-02-07 DIAGNOSIS — E039 Hypothyroidism, unspecified: Secondary | ICD-10-CM | POA: Diagnosis not present

## 2017-02-07 DIAGNOSIS — Z87891 Personal history of nicotine dependence: Secondary | ICD-10-CM | POA: Diagnosis not present

## 2017-02-07 DIAGNOSIS — D62 Acute posthemorrhagic anemia: Secondary | ICD-10-CM | POA: Diagnosis not present

## 2017-02-07 DIAGNOSIS — M810 Age-related osteoporosis without current pathological fracture: Secondary | ICD-10-CM | POA: Diagnosis not present

## 2017-02-07 DIAGNOSIS — E0500 Thyrotoxicosis with diffuse goiter without thyrotoxic crisis or storm: Secondary | ICD-10-CM | POA: Diagnosis not present

## 2017-02-07 DIAGNOSIS — I2510 Atherosclerotic heart disease of native coronary artery without angina pectoris: Secondary | ICD-10-CM | POA: Diagnosis not present

## 2017-02-07 DIAGNOSIS — Z79899 Other long term (current) drug therapy: Secondary | ICD-10-CM | POA: Diagnosis not present

## 2017-02-07 DIAGNOSIS — Z96641 Presence of right artificial hip joint: Secondary | ICD-10-CM | POA: Diagnosis not present

## 2017-02-07 DIAGNOSIS — K219 Gastro-esophageal reflux disease without esophagitis: Secondary | ICD-10-CM | POA: Diagnosis not present

## 2017-02-07 DIAGNOSIS — G4730 Sleep apnea, unspecified: Secondary | ICD-10-CM | POA: Diagnosis not present

## 2017-02-07 DIAGNOSIS — I10 Essential (primary) hypertension: Secondary | ICD-10-CM | POA: Diagnosis not present

## 2017-02-07 DIAGNOSIS — Z7901 Long term (current) use of anticoagulants: Secondary | ICD-10-CM | POA: Diagnosis not present

## 2017-02-07 DIAGNOSIS — I4891 Unspecified atrial fibrillation: Secondary | ICD-10-CM | POA: Diagnosis not present

## 2017-02-07 DIAGNOSIS — F419 Anxiety disorder, unspecified: Secondary | ICD-10-CM | POA: Diagnosis not present

## 2017-02-07 DIAGNOSIS — N200 Calculus of kidney: Secondary | ICD-10-CM | POA: Diagnosis not present

## 2017-02-07 DIAGNOSIS — E785 Hyperlipidemia, unspecified: Secondary | ICD-10-CM | POA: Diagnosis not present

## 2017-02-07 DIAGNOSIS — E7800 Pure hypercholesterolemia, unspecified: Secondary | ICD-10-CM | POA: Diagnosis not present

## 2017-02-07 HISTORY — DX: Personal history of urinary calculi: Z87.442

## 2017-02-07 LAB — BASIC METABOLIC PANEL
Anion gap: 8 (ref 5–15)
BUN: 20 mg/dL (ref 6–20)
CHLORIDE: 101 mmol/L (ref 101–111)
CO2: 24 mmol/L (ref 22–32)
CREATININE: 1.19 mg/dL (ref 0.61–1.24)
Calcium: 8.9 mg/dL (ref 8.9–10.3)
GFR calc Af Amer: >60 mL/min (ref >60)
GFR calc non Af Amer: 60 mL/min — ABNORMAL LOW (ref >60)
Glucose, Bld: 114 mg/dL — ABNORMAL HIGH (ref 65–99)
POTASSIUM: 4.5 mmol/L (ref 3.5–5.1)
Sodium: 133 mmol/L — ABNORMAL LOW (ref 135–145)

## 2017-02-07 LAB — CBC
HCT: 38.1 % — ABNORMAL LOW (ref 39.0–52.0)
Hemoglobin: 11.9 g/dL — ABNORMAL LOW (ref 13.0–17.0)
MCH: 26.9 pg (ref 26.0–34.0)
MCHC: 31.2 g/dL (ref 30.0–36.0)
MCV: 86 fL (ref 78.0–100.0)
Platelets: 326 10*3/uL (ref 150–400)
RBC: 4.43 MIL/uL (ref 4.22–5.81)
RDW: 18.7 % — ABNORMAL HIGH (ref 11.5–15.5)
WBC: 9.6 10*3/uL (ref 4.0–10.5)

## 2017-02-07 NOTE — Progress Notes (Signed)
08/30/16- LOV- cardiology- epic  ECHO-11/03/16-epic   EKG-12/11/16-epic

## 2017-02-08 ENCOUNTER — Ambulatory Visit (HOSPITAL_COMMUNITY): Payer: Medicare Other | Admitting: Anesthesiology

## 2017-02-08 ENCOUNTER — Ambulatory Visit (HOSPITAL_COMMUNITY): Payer: Medicare Other

## 2017-02-08 ENCOUNTER — Encounter (HOSPITAL_COMMUNITY)
Admission: RE | Disposition: A | Discharge status: Home / Self Care | Payer: Self-pay | Source: Ambulatory Visit | Attending: Urology

## 2017-02-08 ENCOUNTER — Observation Stay (HOSPITAL_COMMUNITY)
Admission: RE | Admit: 2017-02-08 | Discharge: 2017-02-09 | Disposition: A | Discharge status: Home / Self Care | Payer: Medicare Other | Source: Ambulatory Visit | Attending: Urology | Admitting: Urology

## 2017-02-08 ENCOUNTER — Encounter (HOSPITAL_COMMUNITY): Payer: Self-pay | Admitting: *Deleted

## 2017-02-08 DIAGNOSIS — F419 Anxiety disorder, unspecified: Secondary | ICD-10-CM | POA: Insufficient documentation

## 2017-02-08 DIAGNOSIS — I10 Essential (primary) hypertension: Secondary | ICD-10-CM | POA: Diagnosis not present

## 2017-02-08 DIAGNOSIS — F329 Major depressive disorder, single episode, unspecified: Secondary | ICD-10-CM | POA: Insufficient documentation

## 2017-02-08 DIAGNOSIS — E039 Hypothyroidism, unspecified: Secondary | ICD-10-CM | POA: Insufficient documentation

## 2017-02-08 DIAGNOSIS — G4730 Sleep apnea, unspecified: Secondary | ICD-10-CM | POA: Insufficient documentation

## 2017-02-08 DIAGNOSIS — I4891 Unspecified atrial fibrillation: Secondary | ICD-10-CM | POA: Insufficient documentation

## 2017-02-08 DIAGNOSIS — Z96641 Presence of right artificial hip joint: Secondary | ICD-10-CM | POA: Insufficient documentation

## 2017-02-08 DIAGNOSIS — N200 Calculus of kidney: Secondary | ICD-10-CM | POA: Diagnosis not present

## 2017-02-08 DIAGNOSIS — E0500 Thyrotoxicosis with diffuse goiter without thyrotoxic crisis or storm: Secondary | ICD-10-CM | POA: Insufficient documentation

## 2017-02-08 DIAGNOSIS — I2510 Atherosclerotic heart disease of native coronary artery without angina pectoris: Secondary | ICD-10-CM | POA: Diagnosis not present

## 2017-02-08 DIAGNOSIS — Z79899 Other long term (current) drug therapy: Secondary | ICD-10-CM | POA: Insufficient documentation

## 2017-02-08 DIAGNOSIS — Z9989 Dependence on other enabling machines and devices: Secondary | ICD-10-CM | POA: Insufficient documentation

## 2017-02-08 DIAGNOSIS — Z87891 Personal history of nicotine dependence: Secondary | ICD-10-CM | POA: Insufficient documentation

## 2017-02-08 DIAGNOSIS — D62 Acute posthemorrhagic anemia: Secondary | ICD-10-CM | POA: Insufficient documentation

## 2017-02-08 DIAGNOSIS — E785 Hyperlipidemia, unspecified: Secondary | ICD-10-CM | POA: Insufficient documentation

## 2017-02-08 DIAGNOSIS — M810 Age-related osteoporosis without current pathological fracture: Secondary | ICD-10-CM | POA: Insufficient documentation

## 2017-02-08 DIAGNOSIS — Z7901 Long term (current) use of anticoagulants: Secondary | ICD-10-CM | POA: Insufficient documentation

## 2017-02-08 DIAGNOSIS — E7800 Pure hypercholesterolemia, unspecified: Secondary | ICD-10-CM | POA: Insufficient documentation

## 2017-02-08 DIAGNOSIS — K219 Gastro-esophageal reflux disease without esophagitis: Secondary | ICD-10-CM | POA: Insufficient documentation

## 2017-02-08 HISTORY — PX: HOLMIUM LASER APPLICATION: SHX5852

## 2017-02-08 HISTORY — PX: NEPHROLITHOTOMY: SHX5134

## 2017-02-08 LAB — CBC
HCT: 35.4 % — ABNORMAL LOW (ref 39.0–52.0)
Hemoglobin: 11.1 g/dL — ABNORMAL LOW (ref 13.0–17.0)
MCH: 27.4 pg (ref 26.0–34.0)
MCHC: 31.4 g/dL (ref 30.0–36.0)
MCV: 87.4 fL (ref 78.0–100.0)
Platelets: 283 10*3/uL (ref 150–400)
RBC: 4.05 MIL/uL — AB (ref 4.22–5.81)
RDW: 18.7 % — ABNORMAL HIGH (ref 11.5–15.5)
WBC: 10.1 10*3/uL (ref 4.0–10.5)

## 2017-02-08 LAB — BASIC METABOLIC PANEL
ANION GAP: 6 (ref 5–15)
BUN: 20 mg/dL (ref 6–20)
CHLORIDE: 103 mmol/L (ref 101–111)
CO2: 25 mmol/L (ref 22–32)
Calcium: 8.5 mg/dL — ABNORMAL LOW (ref 8.9–10.3)
Creatinine, Ser: 1.19 mg/dL (ref 0.61–1.24)
GFR calc non Af Amer: 60 mL/min — ABNORMAL LOW (ref >60)
GLUCOSE: 136 mg/dL — AB (ref 65–99)
Potassium: 5.1 mmol/L (ref 3.5–5.1)
Sodium: 134 mmol/L — ABNORMAL LOW (ref 135–145)

## 2017-02-08 SURGERY — NEPHROLITHOTOMY PERCUTANEOUS
Anesthesia: General | Site: Back | Laterality: Right

## 2017-02-08 MED ORDER — DEXTROSE 5 % IV SOLN
2.0000 g | INTRAVENOUS | Status: AC
  Administered 2017-02-08: 2 g via INTRAVENOUS

## 2017-02-08 MED ORDER — PROPOFOL 10 MG/ML IV BOLUS
INTRAVENOUS | Status: AC
  Filled 2017-02-08: qty 20

## 2017-02-08 MED ORDER — SUCCINYLCHOLINE CHLORIDE 200 MG/10ML IV SOSY
PREFILLED_SYRINGE | INTRAVENOUS | Status: DC | PRN
  Administered 2017-02-08: 120 mg via INTRAVENOUS

## 2017-02-08 MED ORDER — HYDROMORPHONE HCL 1 MG/ML IJ SOLN
INTRAMUSCULAR | Status: AC
  Administered 2017-02-08: 0.5 mg via INTRAVENOUS
  Filled 2017-02-08: qty 1

## 2017-02-08 MED ORDER — DEXTROSE 5 % IV SOLN
INTRAVENOUS | Status: AC
  Filled 2017-02-08: qty 2

## 2017-02-08 MED ORDER — TAMSULOSIN HCL 0.4 MG PO CAPS
0.4000 mg | ORAL_CAPSULE | Freq: Every day | ORAL | Status: DC
  Administered 2017-02-08: 0.4 mg via ORAL
  Filled 2017-02-08: qty 1

## 2017-02-08 MED ORDER — SODIUM CHLORIDE 0.9 % IR SOLN
Status: DC | PRN
  Administered 2017-02-08: 15000 mL

## 2017-02-08 MED ORDER — PANTOPRAZOLE SODIUM 40 MG PO TBEC
40.0000 mg | DELAYED_RELEASE_TABLET | Freq: Every day | ORAL | Status: DC
  Administered 2017-02-08 – 2017-02-09 (×2): 40 mg via ORAL
  Filled 2017-02-08 (×2): qty 1

## 2017-02-08 MED ORDER — SUGAMMADEX SODIUM 500 MG/5ML IV SOLN
INTRAVENOUS | Status: DC | PRN
  Administered 2017-02-08: 400 mg via INTRAVENOUS

## 2017-02-08 MED ORDER — PROPOFOL 10 MG/ML IV BOLUS
INTRAVENOUS | Status: DC | PRN
  Administered 2017-02-08: 170 mg via INTRAVENOUS
  Administered 2017-02-08: 20 mg via INTRAVENOUS

## 2017-02-08 MED ORDER — SODIUM CHLORIDE 0.9 % IV SOLN
INTRAVENOUS | Status: DC
  Administered 2017-02-08: 1000 mL via INTRAVENOUS
  Administered 2017-02-08: 22:00:00 via INTRAVENOUS

## 2017-02-08 MED ORDER — FENTANYL CITRATE (PF) 100 MCG/2ML IJ SOLN
INTRAMUSCULAR | Status: AC
  Filled 2017-02-08: qty 2

## 2017-02-08 MED ORDER — PROMETHAZINE HCL 25 MG/ML IJ SOLN: 6.2500 mg | INTRAMUSCULAR | Status: DC | PRN

## 2017-02-08 MED ORDER — ATORVASTATIN CALCIUM 40 MG PO TABS: 80.0000 mg | ORAL_TABLET | ORAL | Status: DC

## 2017-02-08 MED ORDER — LIDOCAINE 2% (20 MG/ML) 5 ML SYRINGE
INTRAMUSCULAR | Status: DC | PRN
  Administered 2017-02-08: 100 mg via INTRAVENOUS

## 2017-02-08 MED ORDER — RANOLAZINE ER 500 MG PO TB12
500.0000 mg | ORAL_TABLET | Freq: Two times a day (BID) | ORAL | Status: DC
  Administered 2017-02-08 – 2017-02-09 (×2): 500 mg via ORAL
  Filled 2017-02-08 (×3): qty 1

## 2017-02-08 MED ORDER — ROCURONIUM BROMIDE 50 MG/5ML IV SOSY
PREFILLED_SYRINGE | INTRAVENOUS | Status: AC
  Filled 2017-02-08: qty 5

## 2017-02-08 MED ORDER — ROCURONIUM BROMIDE 10 MG/ML (PF) SYRINGE
PREFILLED_SYRINGE | INTRAVENOUS | Status: DC | PRN
  Administered 2017-02-08: 10 mg via INTRAVENOUS
  Administered 2017-02-08: 50 mg via INTRAVENOUS
  Administered 2017-02-08: 20 mg via INTRAVENOUS

## 2017-02-08 MED ORDER — PHENYLEPHRINE 40 MCG/ML (10ML) SYRINGE FOR IV PUSH (FOR BLOOD PRESSURE SUPPORT)
PREFILLED_SYRINGE | INTRAVENOUS | Status: AC
  Filled 2017-02-08: qty 10

## 2017-02-08 MED ORDER — DIPHENHYDRAMINE HCL 12.5 MG/5ML PO ELIX
12.5000 mg | ORAL_SOLUTION | Freq: Four times a day (QID) | ORAL | Status: DC | PRN
Start: 2017-02-08 — End: 2017-02-09

## 2017-02-08 MED ORDER — SODIUM CHLORIDE 0.9 % IR SOLN
Status: DC | PRN
  Administered 2017-02-08: 3000 mL via INTRAVESICAL

## 2017-02-08 MED ORDER — LACTATED RINGERS IV SOLN
INTRAVENOUS | Status: DC | PRN
  Administered 2017-02-08: 07:00:00 via INTRAVENOUS

## 2017-02-08 MED ORDER — IOHEXOL 300 MG/ML  SOLN
INTRAMUSCULAR | Status: DC | PRN
  Administered 2017-02-08: 30 mL

## 2017-02-08 MED ORDER — MIDAZOLAM HCL 5 MG/5ML IJ SOLN
INTRAMUSCULAR | Status: DC | PRN
  Administered 2017-02-08: 2 mg via INTRAVENOUS

## 2017-02-08 MED ORDER — LEVOTHYROXINE SODIUM 100 MCG PO TABS
200.0000 ug | ORAL_TABLET | Freq: Every day | ORAL | Status: DC
  Administered 2017-02-09: 200 ug via ORAL
  Filled 2017-02-08: qty 2

## 2017-02-08 MED ORDER — DEXTROSE 5 % IV SOLN
2.0000 g | INTRAVENOUS | Status: DC
  Administered 2017-02-09: 2 g via INTRAVENOUS
  Filled 2017-02-08: qty 2

## 2017-02-08 MED ORDER — DIPHENHYDRAMINE HCL 50 MG/ML IJ SOLN: 12.5000 mg | Freq: Four times a day (QID) | INTRAMUSCULAR | Status: DC | PRN

## 2017-02-08 MED ORDER — DEXAMETHASONE SODIUM PHOSPHATE 10 MG/ML IJ SOLN
INTRAMUSCULAR | Status: AC
  Filled 2017-02-08: qty 1

## 2017-02-08 MED ORDER — CLONAZEPAM 1 MG PO TABS
1.0000 mg | ORAL_TABLET | Freq: Three times a day (TID) | ORAL | Status: DC | PRN
  Administered 2017-02-08 – 2017-02-09 (×4): 1 mg via ORAL
  Filled 2017-02-08 (×4): qty 1

## 2017-02-08 MED ORDER — BELLADONNA ALKALOIDS-OPIUM 16.2-60 MG RE SUPP
1.0000 | Freq: Four times a day (QID) | RECTAL | Status: DC | PRN
Start: 2017-02-08 — End: 2017-02-09

## 2017-02-08 MED ORDER — AMIODARONE HCL 200 MG PO TABS
200.0000 mg | ORAL_TABLET | Freq: Every day | ORAL | Status: DC
  Administered 2017-02-08 – 2017-02-09 (×2): 200 mg via ORAL
  Filled 2017-02-08 (×2): qty 1

## 2017-02-08 MED ORDER — LIDOCAINE 2% (20 MG/ML) 5 ML SYRINGE
INTRAMUSCULAR | Status: AC
  Filled 2017-02-08: qty 5

## 2017-02-08 MED ORDER — ONDANSETRON HCL 4 MG/2ML IJ SOLN
INTRAMUSCULAR | Status: AC
  Filled 2017-02-08: qty 2

## 2017-02-08 MED ORDER — HYDROMORPHONE HCL 1 MG/ML IJ SOLN
0.5000 mg | INTRAMUSCULAR | Status: DC | PRN
  Administered 2017-02-08 – 2017-02-09 (×4): 1 mg via INTRAVENOUS
  Filled 2017-02-08 (×4): qty 1

## 2017-02-08 MED ORDER — ONDANSETRON HCL 4 MG/2ML IJ SOLN
INTRAMUSCULAR | Status: DC | PRN
  Administered 2017-02-08: 4 mg via INTRAVENOUS

## 2017-02-08 MED ORDER — ZOLPIDEM TARTRATE 5 MG PO TABS: 5.0000 mg | ORAL_TABLET | Freq: Every evening | ORAL | Status: DC | PRN

## 2017-02-08 MED ORDER — DEXAMETHASONE SODIUM PHOSPHATE 10 MG/ML IJ SOLN
INTRAMUSCULAR | Status: DC | PRN
  Administered 2017-02-08: 10 mg via INTRAVENOUS

## 2017-02-08 MED ORDER — DILTIAZEM HCL ER COATED BEADS 240 MG PO CP24
240.0000 mg | ORAL_CAPSULE | Freq: Every day | ORAL | Status: DC
  Administered 2017-02-08 – 2017-02-09 (×2): 240 mg via ORAL
  Filled 2017-02-08 (×2): qty 1

## 2017-02-08 MED ORDER — LORATADINE 10 MG PO TABS
10.0000 mg | ORAL_TABLET | Freq: Every day | ORAL | Status: DC
  Administered 2017-02-08 – 2017-02-09 (×2): 10 mg via ORAL
  Filled 2017-02-08 (×2): qty 1

## 2017-02-08 MED ORDER — HYDROMORPHONE HCL 1 MG/ML IJ SOLN
INTRAMUSCULAR | Status: AC
  Filled 2017-02-08: qty 1

## 2017-02-08 MED ORDER — SUCCINYLCHOLINE CHLORIDE 200 MG/10ML IV SOSY
PREFILLED_SYRINGE | INTRAVENOUS | Status: AC
  Filled 2017-02-08: qty 10

## 2017-02-08 MED ORDER — DULOXETINE HCL 60 MG PO CPEP
60.0000 mg | ORAL_CAPSULE | Freq: Two times a day (BID) | ORAL | Status: DC
  Administered 2017-02-08 – 2017-02-09 (×2): 60 mg via ORAL
  Filled 2017-02-08 (×2): qty 1

## 2017-02-08 MED ORDER — ACETAMINOPHEN 325 MG PO TABS: 650.0000 mg | ORAL_TABLET | ORAL | Status: DC | PRN

## 2017-02-08 MED ORDER — PHENYLEPHRINE 40 MCG/ML (10ML) SYRINGE FOR IV PUSH (FOR BLOOD PRESSURE SUPPORT)
PREFILLED_SYRINGE | INTRAVENOUS | Status: DC | PRN
  Administered 2017-02-08 (×5): 80 ug via INTRAVENOUS

## 2017-02-08 MED ORDER — OXYCODONE-ACETAMINOPHEN 5-325 MG PO TABS
1.0000 | ORAL_TABLET | ORAL | Status: DC | PRN
  Administered 2017-02-08 – 2017-02-09 (×3): 2 via ORAL
  Filled 2017-02-08 (×4): qty 2

## 2017-02-08 MED ORDER — ONDANSETRON HCL 4 MG/2ML IJ SOLN: 4.0000 mg | INTRAMUSCULAR | Status: DC | PRN

## 2017-02-08 MED ORDER — TIZANIDINE HCL 4 MG PO TABS
4.0000 mg | ORAL_TABLET | Freq: Four times a day (QID) | ORAL | Status: DC | PRN
  Administered 2017-02-08: 4 mg via ORAL
  Filled 2017-02-08: qty 1

## 2017-02-08 MED ORDER — FENTANYL CITRATE (PF) 100 MCG/2ML IJ SOLN
INTRAMUSCULAR | Status: DC | PRN
  Administered 2017-02-08: 50 ug via INTRAVENOUS
  Administered 2017-02-08: 100 ug via INTRAVENOUS

## 2017-02-08 MED ORDER — BUPROPION HCL ER (XL) 300 MG PO TB24
300.0000 mg | ORAL_TABLET | Freq: Every day | ORAL | Status: DC
  Administered 2017-02-09: 300 mg via ORAL
  Filled 2017-02-08 (×2): qty 1

## 2017-02-08 MED ORDER — HYDROMORPHONE HCL 1 MG/ML IJ SOLN
0.2500 mg | INTRAMUSCULAR | Status: DC | PRN
  Administered 2017-02-08 (×3): 0.5 mg via INTRAVENOUS

## 2017-02-08 MED ORDER — MIDAZOLAM HCL 2 MG/2ML IJ SOLN
INTRAMUSCULAR | Status: AC
  Filled 2017-02-08: qty 2

## 2017-02-08 MED ORDER — IOPAMIDOL (ISOVUE-300) INJECTION 61%
INTRAVENOUS | Status: AC
  Filled 2017-02-08: qty 50

## 2017-02-08 SURGICAL SUPPLY — 67 items
APL SKNCLS STERI-STRIP NONHPOA (GAUZE/BANDAGES/DRESSINGS) ×2
BAG URINE DRAINAGE (UROLOGICAL SUPPLIES) ×6 IMPLANT
BASKET STONE NITINOL 3FRX115MB (UROLOGICAL SUPPLIES) IMPLANT
BASKET ZERO TIP NITINOL 2.4FR (BASKET) IMPLANT
BENZOIN TINCTURE PRP APPL 2/3 (GAUZE/BANDAGES/DRESSINGS) ×6 IMPLANT
BLADE SURG 15 STRL LF DISP TIS (BLADE) ×1 IMPLANT
BLADE SURG 15 STRL SS (BLADE) ×3
BSKT STON RTRVL ZERO TP 2.4FR (BASKET)
CATCHER STONE W/TUBE ADAPTER (UROLOGICAL SUPPLIES) IMPLANT
CATH FOLEY 2W COUNCIL 20FR 5CC (CATHETERS) IMPLANT
CATH FOLEY 2WAY SLVR  5CC 16FR (CATHETERS) ×2
CATH FOLEY 2WAY SLVR  5CC 18FR (CATHETERS) ×4
CATH FOLEY 2WAY SLVR 5CC 16FR (CATHETERS) ×1 IMPLANT
CATH FOLEY 2WAY SLVR 5CC 18FR (CATHETERS) ×1 IMPLANT
CATH IMAGER II 65CM (CATHETERS) ×3 IMPLANT
CATH INTERMIT  6FR 70CM (CATHETERS) ×3 IMPLANT
CATH ROBINSON RED A/P 20FR (CATHETERS) IMPLANT
CATH URET DUAL LUMEN 6-10FR 50 (CATHETERS) IMPLANT
CATH X-FORCE N30 NEPHROSTOMY (TUBING) ×3 IMPLANT
COVER SURGICAL LIGHT HANDLE (MISCELLANEOUS) ×3 IMPLANT
DRAPE C-ARM 42X120 X-RAY (DRAPES) ×3 IMPLANT
DRAPE LINGEMAN PERC (DRAPES) ×3 IMPLANT
DRAPE SHEET LG 3/4 BI-LAMINATE (DRAPES) ×3 IMPLANT
DRAPE SURG IRRIG POUCH 19X23 (DRAPES) ×3 IMPLANT
DRSG PAD ABDOMINAL 8X10 ST (GAUZE/BANDAGES/DRESSINGS) ×6 IMPLANT
DRSG TEGADERM 8X12 (GAUZE/BANDAGES/DRESSINGS) ×6 IMPLANT
FIBER LASER FLEXIVA 1000 (UROLOGICAL SUPPLIES) IMPLANT
FIBER LASER FLEXIVA 365 (UROLOGICAL SUPPLIES) IMPLANT
FIBER LASER FLEXIVA 550 (UROLOGICAL SUPPLIES) IMPLANT
FIBER LASER TRAC TIP (UROLOGICAL SUPPLIES) ×2 IMPLANT
GAUZE SPONGE 4X4 12PLY STRL (GAUZE/BANDAGES/DRESSINGS) ×3 IMPLANT
GLOVE BIO SURGEON STRL SZ8 (GLOVE) ×9 IMPLANT
GLOVE BIOGEL PI IND STRL 8 (GLOVE) IMPLANT
GLOVE BIOGEL PI INDICATOR 8 (GLOVE)
GOWN STRL REUS W/TWL LRG LVL3 (GOWN DISPOSABLE) ×6 IMPLANT
GOWN STRL REUS W/TWL XL LVL3 (GOWN DISPOSABLE) ×3 IMPLANT
GUIDEWIRE AMPLAZ .035X145 (WIRE) ×3 IMPLANT
GUIDEWIRE STR DUAL SENSOR (WIRE) ×6 IMPLANT
HLDR NDL AMPLATZ W/INSERTS (MISCELLANEOUS) ×1 IMPLANT
HOLDER NEEDLE AMPLATZ W/INSERT (MISCELLANEOUS) ×3 IMPLANT
IV SET EXTENSION CATH 6 NF (IV SETS) ×3 IMPLANT
KIT BASIN OR (CUSTOM PROCEDURE TRAY) ×3 IMPLANT
MANIFOLD NEPTUNE II (INSTRUMENTS) ×3 IMPLANT
NDL TROCAR 18X15 ECHO (NEEDLE) IMPLANT
NDL TROCAR 18X20 (NEEDLE) IMPLANT
NEEDLE TROCAR 18X15 ECHO (NEEDLE) IMPLANT
NEEDLE TROCAR 18X20 (NEEDLE) IMPLANT
NS IRRIG 1000ML POUR BTL (IV SOLUTION) ×3 IMPLANT
PACK CYSTO (CUSTOM PROCEDURE TRAY) ×3 IMPLANT
PROBE LITHOCLAST ULTRA 3.8X403 (UROLOGICAL SUPPLIES) ×2 IMPLANT
PROBE PNEUMATIC 1.0MMX570MM (UROLOGICAL SUPPLIES) ×2 IMPLANT
SET AMPLATZ RENAL DILATOR (MISCELLANEOUS) IMPLANT
SET IRRIG Y TYPE TUR BLADDER L (SET/KITS/TRAYS/PACK) ×3 IMPLANT
SHEATH PEELAWAY SET 9 (SHEATH) ×2 IMPLANT
SPONGE LAP 4X18 X RAY DECT (DISPOSABLE) ×3 IMPLANT
STENT CONTOUR 6FRX26X.038 (STENTS) IMPLANT
STENT CONTOUR 6FRX28X.038 (STENTS) ×2 IMPLANT
STONE CATCHER W/TUBE ADAPTER (UROLOGICAL SUPPLIES) ×3 IMPLANT
SUT SILK 2 0 30  PSL (SUTURE) ×6
SUT SILK 2 0 30 PSL (SUTURE) ×1 IMPLANT
SYR 10ML LL (SYRINGE) ×3 IMPLANT
SYR 20CC LL (SYRINGE) ×6 IMPLANT
SYR 50ML LL SCALE MARK (SYRINGE) ×3 IMPLANT
TOWEL OR 17X26 10 PK STRL BLUE (TOWEL DISPOSABLE) ×3 IMPLANT
TUBING CONNECTING 10 (TUBING) ×4 IMPLANT
TUBING CONNECTING 10' (TUBING) ×2
WATER STERILE IRR 1500ML POUR (IV SOLUTION) ×3 IMPLANT

## 2017-02-08 NOTE — Op Note (Signed)
Preoperative diagnosis: Right renal stone  Postoperative diagnosis: Same  Procedure 1.  Right percutaneous nephrostolithotomy for stone greater than 2 cm 2.  Right nephrostogram 3.  Intraoperative fluoroscopy, under 1 hour, with interpretation 4.  Placement of a 6 x 26 double-J ureteral stent. 5.  Percutaneous access into the Right renal collecting system 6.  Placement of a 56 French nephrostomy tube 7.  Dilation of percutaneous tract  Attending: Dr. Alyson Ingles  Anesthesia: General  Estimated blood loss: 100  Antibiotics: rocephin  Drains: 1.  16 French Foley catheter 2.  6 x 26 Right double-J ureteral stent 3.  20 French nephrostomy tube  Specimens: Stone for analysis  Findings: complete staghorn. Lower pole posterior calyx access.  Indications: Patient is a 71 year old male/male with a history of large right renal stone.  After discussing treatment options and they decided to proceed with right percutaneous nephrostolithotomy.    Procedure in detail: Prior to procedure consent was obtained.  Patient was brought to the operating room and a timeout was done to ensure correct patient, correct procedure, and correct site.  General anesthesia was administered. The patients genetalia was prepped and draped. A flexible cystoscope was passed into the urethra and then the bladder. No masses or lesions were seen in the bladder. Ureteral orifices were in the normal anatomic location. A sensor wire was passed into the right ureter and up to the renal pelvis. A 6 french ureteral catheter was advanced over the wire and up to the renal pelvis the wire was then removed and so was the cystoscope.  A 16 French Foley catheter was in place and the ureteral catheter was secured with a 0 silk tie.  The patient was then placed in the prone position.   The right flank was then prepped and draped in usual sterile fashion.  Contrast was instilled throught the ureteral catheter and the bullseye technique was  used to gain access into the lower pole. Once we gained access a sensor wire was advanced through the spinal needle and down the ureter to the bladder. We then used an 8 french and 10 french dilater to dilate the tract. We then used a sheath to place a second wire down to the bladder.  We then made an incision at the level of the skin and over the wire we then placed a NephroMax dilator.  We dilated the nephrostomy tract to 30 [place] and held this 18 cm of water for 1 minute.  We then  placed the access sheath over the balloon.  The balloon was then deflated.  We then used a rigid nephroscope to perform nephroscopy.  We encountered a large lower pole stone. Using the LithoClast the stone was fragmented and the fragments were removed with graspers.  The stone fragments were sent for composition analysis.  We then removed the nephroscope and over the wire placed a 6 x 26 double-J ureteral stent.  The wire was then removed and good coil was noted in the renal pelvis under direct vision in the bladder under fluoroscopy.  We then placed a 20 French nephrostomy tube through the sheath into the renal pelvis.  The balloon was inflated with 3 mls of contrast.  We then removed the access sheath in and obtained another nephrostogram.  We noted minimal extravasation of contrast.  We then secured the nephrostomy tube with 0 silks in interrupted fashion.  Dressing was placed over the nephrostomy tube site and this then concluded the procedure was well-tolerated by the patient.  Complications: None  Condition: Stable, extubated, transferred to PACU  Plan: Patient is to be admitted overnight for observation.  Foley catheter through the morning.  Pt is then to be discharged home and followup in 3 days for CT stone study prior to nephrostomy tube removal. Stent to be removed 1 week after nephrostomy tube

## 2017-02-08 NOTE — Anesthesia Procedure Notes (Signed)
Procedure Name: Intubation Date/Time: 02/08/2017 7:34 AM Performed by: Lind Covert Pre-anesthesia Checklist: Patient identified, Emergency Drugs available, Suction available, Patient being monitored and Timeout performed Patient Re-evaluated:Patient Re-evaluated prior to inductionOxygen Delivery Method: Circle system utilized Preoxygenation: Pre-oxygenation with 100% oxygen Intubation Type: IV induction Laryngoscope Size: Mac and 4 Grade View: Grade I Tube type: Oral Tube size: 7.5 mm Number of attempts: 1 Airway Equipment and Method: Stylet Placement Confirmation: ETT inserted through vocal cords under direct vision,  positive ETCO2 and breath sounds checked- equal and bilateral Secured at: 23 cm Tube secured with: Tape Dental Injury: Teeth and Oropharynx as per pre-operative assessment

## 2017-02-08 NOTE — Anesthesia Preprocedure Evaluation (Addendum)
Anesthesia Evaluation  Patient identified by MRN, date of birth, ID band Patient awake    Reviewed: Allergy & Precautions, NPO status , Patient's Chart, lab work & pertinent test results  Airway Mallampati: II  TM Distance: >3 FB Neck ROM: Full    Dental no notable dental hx.    Pulmonary sleep apnea , former smoker,    Pulmonary exam normal breath sounds clear to auscultation       Cardiovascular hypertension, + dysrhythmias Atrial Fibrillation  Rhythm:Irregular Rate:Normal     Neuro/Psych negative neurological ROS  negative psych ROS   GI/Hepatic negative GI ROS, Neg liver ROS,   Endo/Other  Hypothyroidism   Renal/GU negative Renal ROS  negative genitourinary   Musculoskeletal negative musculoskeletal ROS (+)   Abdominal   Peds negative pediatric ROS (+)  Hematology negative hematology ROS (+)   Anesthesia Other Findings   Reproductive/Obstetrics negative OB ROS                           Anesthesia Physical Anesthesia Plan  ASA: III  Anesthesia Plan: General   Post-op Pain Management:    Induction: Intravenous  Airway Management Planned: Oral ETT  Additional Equipment:   Intra-op Plan:   Post-operative Plan: Extubation in OR  Informed Consent: I have reviewed the patients History and Physical, chart, labs and discussed the procedure including the risks, benefits and alternatives for the proposed anesthesia with the patient or authorized representative who has indicated his/her understanding and acceptance.   Dental advisory given  Plan Discussed with: CRNA and Surgeon  Anesthesia Plan Comments:         Anesthesia Quick Evaluation

## 2017-02-08 NOTE — Transfer of Care (Signed)
Immediate Anesthesia Transfer of Care Note  Patient: [name]  Procedure(s) Performed: Procedure(s): NEPHROLITHOTOMY PERCUTANEOUS WITH SURGEON ACCESS (Right) HOLMIUM LASER APPLICATION (Right)  Patient Location: PACU  Anesthesia Type:General  Level of Consciousness: sedated  Airway & Oxygen Therapy: Patient Spontanous Breathing and Patient connected to face mask oxygen  Post-op Assessment: Report given to RN and Post -op Vital signs reviewed and stable  Post vital signs: Reviewed and stable  Last Vitals:  Vitals:   02/08/17 1100 02/08/17 1119  BP: (!) 144/79 (!) 158/78  Pulse: 98 (!) 107  Resp: 13 16  Temp:  36.3 C    Last Pain:  Vitals:   02/08/17 1130  TempSrc:   PainSc: 3       Patients Stated Pain Goal: 0 (47/18/55 0158)  Complications: No apparent anesthesia complications

## 2017-02-08 NOTE — H&P (Signed)
Urology Admission H&P  Chief Complaint: gross hematuria, right renal calculus  History of Present Illness: [name] is a 71yo with gross hematuria who was found to have a large right renal calculus on CT.  Past Medical History:  Diagnosis Date  . A-fib (HCC)    hx.- sinus rhythm now  . Acid reflux   . Acute blood loss anemia   . Anxiety   . Arthritis   . BPH (benign prostatic hyperplasia)   . CAD (coronary artery disease)   . Depression   . Dislocation of internal right hip prosthesis, sequela   . Environmental allergies   . Fracture of wrist 10/23/13   LEFT  . High cholesterol   . History of kidney stones   . HLD (hyperlipidemia)   . Hypertension   . Low testosterone, possible hypogonadism 10/01/2012  . Lumbago   . Metabolic bone disease   . Osteoporosis   . Plaque psoriasis   . Sleep apnea    cpap machine-settings 17  . Thrombocytopenia ([place])   . Thyroid disease    HX GRAVES DISEASE  . Transfusion history    s/p 12'13 hip surgery  . Unsteady gait   . Unsteady gait   . Vertebral compression fracture (HCC)    L1-wears brace   Past Surgical History:  Procedure Laterality Date  . APPENDECTOMY    . CARDIAC CATHETERIZATION  2013  . HARDWARE REMOVAL  10/05/2012   Procedure: HARDWARE REMOVAL;  Surgeon: [name], MD;  Location: [facility];  Service: Orthopedics;  Laterality: Right;  REMOVING  STRYKER  GAMMA NAIL  . HARDWARE REMOVAL Right 07/03/2013   Procedure: HARDWARE REMOVAL RIGHT TIBIA ;  Surgeon: Rozanna Box, MD;  Location: [facility];  Service: Orthopedics;  Laterality: Right;  . HIP CLOSED REDUCTION Right 10/15/2013   Procedure: CLOSED MANIPULATION HIP;  Surgeon: [name], MD;  Location: [facility];  Service: Orthopedics;  Laterality: Right;  . INCISION AND DRAINAGE HIP Right 11/16/2013   Procedure: IRRIGATION AND DEBRIDEMENT RIGHT HIP;  Surgeon: [name], MD;  Location: [facility];  Service: Orthopedics;  Laterality: Right;  . JOINT REPLACEMENT     hip-right  x2  . KNEE SURGERY Bilateral 2012   Total knee replacements  . ORIF TIBIA FRACTURE Right 02/06/2013   Procedure: OPEN REDUCTION INTERNAL FIXATION (ORIF) TIBIA FRACTURE WITH IM ROD FIBULA;  Surgeon: Rozanna Box, MD;  Location: [facility];  Service: Orthopedics;  Laterality: Right;  . ORIF TIBIA FRACTURE Right 07/03/2013   Procedure: RIGHT TIBIA NON UNION REPAIR ;  Surgeon: Rozanna Box, MD;  Location: [facility];  Service: Orthopedics;  Laterality: Right;  . ORIF WRIST FRACTURE  10/02/2012   Procedure: OPEN REDUCTION INTERNAL FIXATION (ORIF) WRIST FRACTURE;  Surgeon: Roseanne Kaufman, MD;  Location: [facility];  Service: Orthopedics;  Laterality: Right;  WITH   ANTIBIOTIC  CEMENT  . ORIF WRIST FRACTURE Left 10/28/2013   Procedure: OPEN REDUCTION INTERNAL FIXATION (ORIF) WRIST FRACTURE with allograft;  Surgeon: Roseanne Kaufman, MD;  Location: [facility];  Service: Orthopedics;  Laterality: Left;  DVR Plate  . right femur surgery  05/2012  . TOTAL HIP REVISION  10/05/2012   Procedure: TOTAL HIP REVISION;  Surgeon: [name], MD;  Location: [facility];  Service: Orthopedics;  Laterality: Right;  RIGHT TOTAL HIP REVISION  . TOTAL HIP REVISION Right 09/17/2013   Procedure: REVISION RIGHT TOTAL HIP ARTHROPLASTY ;  Surgeon: [name], MD;  Location: [facility];  Service: Orthopedics;  Laterality: Right;  . TOTAL HIP REVISION Right 10/26/2013   Procedure: REVISION RIGHT TOTAL HIP ARTHROPLASTY;  Surgeon: [name], MD;  Location: [facility];  Service: Orthopedics;  Laterality: Right;  . TOTAL HIP REVISION  03/2016  . WRIST FRACTURE SURGERY  05/2012    Home Medications:  Prescriptions Prior to Admission  Medication Sig Dispense Refill Last Dose  . acetaminophen (TYLENOL) 500 MG tablet Take 500-1,000 mg by mouth every 6 (six) hours as needed (for pain.).   Past Month at Unknown time  . amiodarone (PACERONE) 200 MG tablet Take 200 mg by mouth daily.   02/07/2017 at Unknown time  . atorvastatin (LIPITOR) 80 MG tablet Take  80 mg by mouth 2 (two) times a week. Monday & Friday evenings ONLY   02/07/2017 at Unknown time  . buPROPion (WELLBUTRIN XL) 300 MG 24 hr tablet Take 300 mg by mouth daily with breakfast.    02/07/2017 at Unknown time  . Calcium Citrate-Vitamin D (CALCIUM CITRATE + D PO) Take 1 tablet by mouth daily with breakfast. Calcium citrate 400 plus D   Past Week at Unknown time  . Cholecalciferol (VITAMIN D) 2000 units CAPS Take 2,000 Units by mouth 2 (two) times daily.   Past Week at Unknown time  . clonazePAM (KLONOPIN) 1 MG tablet Take one tablet by mouth every 6 hours (Patient taking differently: Take 1 mg by mouth every 6 (six) hours as needed for anxiety. ) 120 tablet 0 02/08/2017 at 0300  . diltiazem (CARDIZEM CD) 240 MG 24 hr capsule Take 240 mg by mouth daily.    02/07/2017 at Unknown time  . doxycycline (VIBRA-TABS) 100 MG tablet Take 100 mg by mouth daily.   02/07/2017 at Unknown time  . DULoxetine (CYMBALTA) 60 MG capsule Take 60 mg by mouth 2 (two) times daily.    02/07/2017 at Unknown time  . EPINEPHrine (EPIPEN 2-PAK) 0.3 mg/0.3 mL IJ SOAJ injection Inject 0.3 mg into the muscle daily as needed (for anaphylactic reaction).     [name] etanercept (ENBREL) 50 MG/ML injection Inject 50 mg into the skin 2 (two) times a week. Tuesday & Friday   02/04/2017  . ferrous sulfate 325 (65 FE) MG tablet Take 325 mg by mouth 2 (two) times daily.    Past Week at Unknown time  . HYDROcodone-acetaminophen (NORCO) 7.5-325 MG tablet Take 1 tablet by mouth every 6 (six) hours. (Patient taking differently: Take 1 tablet by mouth every 6 (six) hours as needed (FOR PAIN.). ) 10 tablet 0 02/08/2017 at 0300  . levothyroxine (SYNTHROID, LEVOTHROID) 200 MCG tablet Take 200 mcg by mouth daily at 6 (six) AM. (0500)   02/08/2017 at 0300  . loratadine (CLARITIN) 10 MG tablet Take 10 mg by mouth daily with lunch.    02/07/2017 at Unknown time  . Multiple Vitamin (MULTIVITAMIN WITH MINERALS) TABS tablet Take 1 tablet by mouth daily. Men's  One-A-Day 50+   Past Week at Unknown time  . nitroGLYCERIN (NITROSTAT) 0.4 MG SL tablet Place 0.4 mg under the tongue every 5 (five) minutes as needed for chest pain. x3 doses as needed for chest pain   Taking  . omeprazole (PRILOSEC) 40 MG capsule Take 40 mg by mouth daily before breakfast.    02/07/2017 at Unknown time  . polyethylene glycol (MIRALAX / GLYCOLAX) packet Take 17 g by mouth daily as needed for mild constipation.    Past Week at Unknown time  . Probiotic Product (PROBIOTIC PO) Take  1 capsule by mouth daily with breakfast.   Past Week at Unknown time  . ranolazine (RANEXA) 500 MG 12 hr tablet Take 500 mg by mouth 2 (two) times daily.    02/07/2017 at Unknown time  . rivaroxaban (XARELTO) 20 MG TABS tablet Take 20 mg by mouth daily. Last dose on 02/04/2017 at 0800am   02/04/2017 at 0800  . sulfamethoxazole-trimethoprim (BACTRIM DS,SEPTRA DS) 800-160 MG tablet Take 1 tablet by mouth 2 (two) times daily.  0 02/07/2017 at Unknown time  . Suvorexant (BELSOMRA) 15 MG TABS Take 15 mg by mouth at bedtime as needed (FOR SLEEP.).   Past Month at Unknown time  . Tamsulosin HCl (FLOMAX) 0.4 MG CAPS Take 0.4 mg by mouth at bedtime.    02/07/2017 at Unknown time  . testosterone cypionate (DEPOTESTOSTERONE CYPIONATE) 200 MG/ML injection Inject 200mg  (85ml) intramuscularly every 14 days 10 mL 0 Taking  . tiZANidine (ZANAFLEX) 4 MG tablet Take 4 mg by mouth every 6 (six) hours as needed for muscle spasms.   02/07/2017 at hs  . vitamin C (ASCORBIC ACID) 500 MG tablet Take 500 mg by mouth daily.   Past Week at Unknown time  . cefdinir (OMNICEF) 300 MG capsule Take 1 capsule (300 mg total) by mouth 2 (two) times daily. (Patient not taking: Reported on 02/02/2017) 20 capsule 0 Not Taking at Unknown time  . denosumab (PROLIA) 60 MG/ML SOLN injection Inject 60 mg into the skin every 6 (six) months. Administer in upper arm, thigh, or abdomen   More than a month at Unknown time   Allergies: No Known Allergies  Family  History  Problem Relation Age of Onset  . CAD Father 37  . Arthritis Mother    Social History:  reports that he quit smoking about 4 years ago. He has never used smokeless tobacco. He reports that he drinks alcohol. He reports that he does not use drugs.  Review of Systems  Genitourinary: Positive for hematuria.  All other systems reviewed and are negative.   Physical Exam:  Vital signs in last 24 hours: Temp:  [98.1 F (36.7 C)-99.3 F (37.4 C)] 98.1 F (36.7 C) (05/01 0541) Pulse Rate:  [96-108] 96 (05/01 0541) Resp:  [16-18] 18 (05/01 0541) BP: (134-141)/(53-63) 134/53 (05/01 0541) SpO2:  [100 %] 100 % (05/01 0541) Weight:  [125.2 kg (276 lb)] 125.2 kg (276 lb) (05/01 0538) Physical Exam  Constitutional: He is oriented to person, place, and time. He appears well-developed and well-nourished.  HENT:  Head: Normocephalic and atraumatic.  Eyes: EOM are normal. Pupils are equal, round, and reactive to light.  Neck: Normal range of motion. No thyromegaly present.  Cardiovascular: Normal rate and regular rhythm.   Respiratory: Effort normal. No respiratory distress.  GI: Soft. He exhibits no distension.  Musculoskeletal: Normal range of motion. He exhibits no edema.  Neurological: He is alert and oriented to person, place, and time.  Skin: Skin is warm and dry.  Psychiatric: He has a normal mood and affect. His behavior is normal. Judgment and thought content normal.    Laboratory Data:  Results for orders placed or performed during the hospital encounter of 02/07/17 (from the past 24 hour(s))  CBC     Status: Abnormal   Collection Time: 02/07/17  1:35 PM  Result Value Ref Range   WBC 9.6 4.0 - 10.5 K/uL   RBC 4.43 4.22 - 5.81 MIL/uL   Hemoglobin 11.9 (L) 13.0 - 17.0 g/dL   HCT 38.1 (L)  39.0 - 52.0 %   MCV 86.0 78.0 - 100.0 fL   MCH 26.9 26.0 - 34.0 pg   MCHC 31.2 30.0 - 36.0 g/dL   RDW 18.7 (H) 11.5 - 15.5 %   Platelets 326 150 - 400 K/uL  Basic metabolic panel      Status: Abnormal   Collection Time: 02/07/17  1:35 PM  Result Value Ref Range   Sodium 133 (L) 135 - 145 mmol/L   Potassium 4.5 3.5 - 5.1 mmol/L   Chloride 101 101 - 111 mmol/L   CO2 24 22 - 32 mmol/L   Glucose, Bld 114 (H) 65 - 99 mg/dL   BUN 20 6 - 20 mg/dL   Creatinine, Ser 1.19 0.61 - 1.24 mg/dL   Calcium 8.9 8.9 - 10.3 mg/dL   GFR calc non Af Amer 60 (L) >60 mL/min   GFR calc Af Amer >60 >60 mL/min   Anion gap 8 5 - 15   No results found for this or any previous visit (from the past 240 hour(s)). Creatinine:  Recent Labs  02/07/17 1335  CREATININE 1.19   Baseline Creatinine: 1.2  Impression/Assessment:  70yo with right renal calculus  Plan:  The risks/benefits/alternatives to R PCNL was explained to the patient and he understands and wishes to proceed with surgery  [name] 02/08/2017, 7:27 AM

## 2017-02-08 NOTE — Anesthesia Postprocedure Evaluation (Signed)
Anesthesia Post Note  Patient: BRYDON SPAHR  Procedure(s) Performed: Procedure(s) (LRB): NEPHROLITHOTOMY PERCUTANEOUS WITH SURGEON ACCESS (Right) HOLMIUM LASER APPLICATION (Right)  Patient location during evaluation: PACU Anesthesia Type: General Level of consciousness: awake and alert Pain management: pain level controlled Vital Signs Assessment: post-procedure vital signs reviewed and stable Respiratory status: spontaneous breathing, nonlabored ventilation, respiratory function stable and patient connected to nasal cannula oxygen Cardiovascular status: blood pressure returned to baseline and stable Postop Assessment: no signs of nausea or vomiting Anesthetic complications: no       Last Vitals:  Vitals:   02/08/17 1012 02/08/17 1015  BP:  119/68  Pulse: 90 100  Resp: (!) 30 18  Temp:  36.8 C    Last Pain:  Vitals:   02/08/17 1015  TempSrc:   PainSc: 0-No pain                 Brianah Hopson S

## 2017-02-09 DIAGNOSIS — N200 Calculus of kidney: Secondary | ICD-10-CM | POA: Diagnosis not present

## 2017-02-09 LAB — BASIC METABOLIC PANEL
Anion gap: 7 (ref 5–15)
BUN: 18 mg/dL (ref 6–20)
CHLORIDE: 103 mmol/L (ref 101–111)
CO2: 26 mmol/L (ref 22–32)
Calcium: 8.6 mg/dL — ABNORMAL LOW (ref 8.9–10.3)
Creatinine, Ser: 1.04 mg/dL (ref 0.61–1.24)
GFR calc Af Amer: >60 mL/min (ref >60)
GFR calc non Af Amer: >60 mL/min (ref >60)
Glucose, Bld: 138 mg/dL — ABNORMAL HIGH (ref 65–99)
POTASSIUM: 4.3 mmol/L (ref 3.5–5.1)
SODIUM: 136 mmol/L (ref 135–145)

## 2017-02-09 LAB — CBC
HEMATOCRIT: 36.3 % — AB (ref 39.0–52.0)
HEMOGLOBIN: 11.1 g/dL — AB (ref 13.0–17.0)
MCH: 27 pg (ref 26.0–34.0)
MCHC: 30.6 g/dL (ref 30.0–36.0)
MCV: 88.3 fL (ref 78.0–100.0)
Platelets: 273 10*3/uL (ref 150–400)
RBC: 4.11 MIL/uL — AB (ref 4.22–5.81)
RDW: 18.7 % — ABNORMAL HIGH (ref 11.5–15.5)
WBC: 9.9 10*3/uL (ref 4.0–10.5)

## 2017-02-09 MED ORDER — OXYCODONE-ACETAMINOPHEN 5-325 MG PO TABS: 1.0000 | ORAL_TABLET | ORAL | 0 refills | Status: DC | PRN

## 2017-02-09 NOTE — Progress Notes (Signed)
Pt discharging with no HH needs at present time. 

## 2017-02-09 NOTE — Progress Notes (Signed)
Pt instructed on care of nephrostomy tube at home.  Pt verbalized and demonstrated understanding. Stacey Drain

## 2017-02-09 NOTE — Discharge Instructions (Signed)
Percutaneous Nephrostomy, Care After °This sheet gives you information about how to care for yourself after your procedure. Your health care provider may also give you more specific instructions. If you have problems or questions, contact your health care provider. °What can I expect after the procedure? °After the procedure, it is common to have: °· Some soreness where the nephrostomy tube was inserted (tube insertion site). °· Blood-tinged drainage from the nephrostomy tube for the first 24 hours. °Follow these instructions at home: °Activity °· Return to your normal activities as told by your health care provider. Ask your health care provider what activities are safe for you. °· Avoid activities that may cause the nephrostomy tubing to bend. °· Do not take baths, swim, or use a hot tub until your health care provider approves. Ask your health care provider if you can take showers. Cover the nephrostomy tube dressing with a watertight covering when you take a shower. °· Do not drive for 24 hours if you were given a medicine to help you relax (sedative). °Care of the tube insertion site °· Follow instructions from your health care provider about how to take care of your tube insertion site. Make sure you: °¨ Wash your hands with soap and water before you change your bandage (dressing). If soap and water are not available, use hand sanitizer. °¨ Change your dressing as told by your health care provider. Be careful not to pull on the tube while removing the dressing. °¨ When you change the dressing, wash the skin around the tube, rinse well, and pat the skin dry. °· Check the tube insertion area every day for signs of infection. Check for: °¨ More redness, swelling, or pain. °¨ More fluid or blood. °¨ Warmth. °¨ Pus or a bad smell. °Care of the nephrostomy tube and drainage bag °· Always keep the tubing, the leg bag, or the bedside drainage bags below the level of the kidney so that your urine drains freely. °· When  connecting your nephrostomy tube to a drainage bag, make sure that there are no kinks in the tubing and that your urine is draining freely. You may want to use an elastic bandage to wrap any exposed tubing that goes from the nephrostomy tube to any of the connecting tubes. °· At night, you may want to connect your nephrostomy tube or the leg bag to a larger bedside drainage bag. °· Follow instructions from your health care provider about how to empty or change the drainage bag. °· Empty the drainage bag when it becomes ? full. °· Replace the drainage bag and any extension tubing that is connected to your nephrostomy tube every 3 weeks or as often as told by your health care provider. Your health care provider will explain how to change the drainage bag and extension tubing. °General instructions °· Take over-the-counter and prescription medicines only as told by your health care provider. °· Keep all follow-up visits as told by your health care provider. This is important. °Contact a health care provider if: °· You have problems with any of the valves or tubing. °· You have persistent pain or soreness in your back. °· You have more redness, swelling, or pain around your tube insertion site. °· You have more fluid or blood coming from your tube insertion site. °· Your tube insertion site feels warm to the touch. °· You have pus or a bad smell coming from your tube insertion site. °· You have increased urine output or you feel burning   when urinating. °Get help right away if: °· You have pain in your abdomen during the first week. °· You have chest pain or have trouble breathing. °· You have a new appearance of blood in your urine. °· You have a fever or chills. °· You have back pain that is not relieved by your medicine. °· You have decreased urine output. °· Your nephrostomy tube comes out. °This information is not intended to replace advice given to you by your health care provider. Make sure you discuss any  questions you have with your health care provider. °Document Released: 05/20/2004 Document Revised: 07/09/2016 Document Reviewed: 07/09/2016 °[employer] Interactive Patient Education © 2017 [employer] ° °

## 2017-02-11 ENCOUNTER — Encounter (HOSPITAL_COMMUNITY): Payer: Self-pay | Admitting: Nurse Practitioner

## 2017-02-11 DIAGNOSIS — Y733 Surgical instruments, materials and gastroenterology and urology devices (including sutures) associated with adverse incidents: Secondary | ICD-10-CM | POA: Insufficient documentation

## 2017-02-11 DIAGNOSIS — I2510 Atherosclerotic heart disease of native coronary artery without angina pectoris: Secondary | ICD-10-CM | POA: Diagnosis not present

## 2017-02-11 DIAGNOSIS — Z79899 Other long term (current) drug therapy: Secondary | ICD-10-CM | POA: Insufficient documentation

## 2017-02-11 DIAGNOSIS — T83512A Infection and inflammatory reaction due to nephrostomy catheter, initial encounter: Secondary | ICD-10-CM | POA: Insufficient documentation

## 2017-02-11 DIAGNOSIS — Z96641 Presence of right artificial hip joint: Secondary | ICD-10-CM | POA: Diagnosis not present

## 2017-02-11 DIAGNOSIS — E039 Hypothyroidism, unspecified: Secondary | ICD-10-CM | POA: Diagnosis not present

## 2017-02-11 DIAGNOSIS — Y828 Other medical devices associated with adverse incidents: Secondary | ICD-10-CM | POA: Diagnosis not present

## 2017-02-11 DIAGNOSIS — Z87891 Personal history of nicotine dependence: Secondary | ICD-10-CM | POA: Insufficient documentation

## 2017-02-11 NOTE — Discharge Summary (Signed)
Physician Discharge Summary  Patient ID: [name] MRN: [id number] DOB/AGE: 72/29/1947 72 y.o.  Admit date: 02/08/2017 Discharge date: 02/09/2017  Admission Diagnoses: Right renal calculus Discharge Diagnoses:  Active Problems:   Renal calculus, right   Discharged Condition: good  Hospital Course: The patient tolerated the procedure well and was transferred to the floor on IV pain meds, IV fluid. On POD#1 foley was removed, pt was started on a regular diet and they ambulated in the halls.  Prior to discharge the pt was tolerating a regular diet, pain was controlled on PO pain meds, they were ambulating without difficulty, and they had normal bowel function.  Consults: None  Significant Diagnostic Studies: none  Treatments: surgery: Right PCNL  Discharge Exam: Blood pressure (!) 165/86, pulse (!) 108, temperature 98.4 F (36.9 C), temperature source Oral, resp. rate 16, height 6\' 3"  (1.905 m), weight 125.2 kg (276 lb), SpO2 99 %. General appearance: alert, cooperative and appears stated age Head: Normocephalic, without obvious abnormality, atraumatic Nose: Nares normal. Septum midline. Mucosa normal. No drainage or sinus tenderness. Neck: no adenopathy, no carotid bruit, no JVD, supple, symmetrical, trachea midline and thyroid not enlarged, symmetric, no tenderness/mass/nodules Resp: clear to auscultation bilaterally Cardio: regular rate and rhythm, S1, S2 normal, no murmur, click, rub or gallop GI: soft, non-tender; bowel sounds normal; no masses,  no organomegaly Extremities: extremities normal, atraumatic, no cyanosis or edema Neurologic: Grossly normal  Disposition: 01-Home or Self Care  Discharge Instructions    Discharge patient    Complete by:  As directed    Discharge disposition:  01-Home or Self Care   Discharge patient date:  02/09/2017     Allergies as of 02/09/2017   No Known Allergies     Medication List    TAKE these medications   acetaminophen 500 MG  tablet Commonly known as:  TYLENOL Take 500-1,000 mg by mouth every 6 (six) hours as needed (for pain.).   amiodarone 200 MG tablet Commonly known as:  PACERONE Take 200 mg by mouth daily.   atorvastatin 80 MG tablet Commonly known as:  LIPITOR Take 80 mg by mouth 2 (two) times a week. Monday & Friday evenings ONLY   BELSOMRA 15 MG Tabs Generic drug:  Suvorexant Take 15 mg by mouth at bedtime as needed (FOR SLEEP.).   buPROPion 300 MG 24 hr tablet Commonly known as:  WELLBUTRIN XL Take 300 mg by mouth daily with breakfast.   CALCIUM CITRATE + D PO Take 1 tablet by mouth daily with breakfast. Calcium citrate 400 plus D   cefdinir 300 MG capsule Commonly known as:  OMNICEF Take 1 capsule (300 mg total) by mouth 2 (two) times daily.   clonazePAM 1 MG tablet Commonly known as:  KLONOPIN Take one tablet by mouth every 6 hours What changed:  how much to take  how to take this  when to take this  reasons to take this  additional instructions   denosumab 60 MG/ML Soln injection Commonly known as:  PROLIA Inject 60 mg into the skin every 6 (six) months. Administer in upper arm, thigh, or abdomen   diltiazem 240 MG 24 hr capsule Commonly known as:  CARDIZEM CD Take 240 mg by mouth daily.   doxycycline 100 MG tablet Commonly known as:  VIBRA-TABS Take 100 mg by mouth daily.   DULoxetine 60 MG capsule Commonly known as:  CYMBALTA Take 60 mg by mouth 2 (two) times daily.   ENBREL 50 MG/ML injection Generic drug:  etanercept Inject 50 mg into the skin 2 (two) times a week. Tuesday & Friday   EPIPEN 2-PAK 0.3 mg/0.3 mL Soaj injection Generic drug:  EPINEPHrine Inject 0.3 mg into the muscle daily as needed (for anaphylactic reaction).   ferrous sulfate 325 (65 FE) MG tablet Take 325 mg by mouth 2 (two) times daily.   HYDROcodone-acetaminophen 7.5-325 MG tablet Commonly known as:  NORCO Take 1 tablet by mouth every 6 (six) hours. What changed:  when to take  this  reasons to take this   levothyroxine 200 MCG tablet Commonly known as:  SYNTHROID, LEVOTHROID Take 200 mcg by mouth daily at 6 (six) AM. (0500)   loratadine 10 MG tablet Commonly known as:  CLARITIN Take 10 mg by mouth daily with lunch.   multivitamin with minerals Tabs tablet Take 1 tablet by mouth daily. Men's One-A-Day 50+   nitroGLYCERIN 0.4 MG SL tablet Commonly known as:  NITROSTAT Place 0.4 mg under the tongue every 5 (five) minutes as needed for chest pain. x3 doses as needed for chest pain   omeprazole 40 MG capsule Commonly known as:  PRILOSEC Take 40 mg by mouth daily before breakfast.   oxyCODONE-acetaminophen 5-325 MG tablet Commonly known as:  PERCOCET/ROXICET Take 1-2 tablets by mouth every 4 (four) hours as needed for moderate pain.   polyethylene glycol packet Commonly known as:  MIRALAX / GLYCOLAX Take 17 g by mouth daily as needed for mild constipation.   PROBIOTIC PO Take 1 capsule by mouth daily with breakfast.   ranolazine 500 MG 12 hr tablet Commonly known as:  RANEXA Take 500 mg by mouth 2 (two) times daily.   sulfamethoxazole-trimethoprim 800-160 MG tablet Commonly known as:  BACTRIM DS,SEPTRA DS Take 1 tablet by mouth 2 (two) times daily.   tamsulosin 0.4 MG Caps capsule Commonly known as:  FLOMAX Take 0.4 mg by mouth at bedtime.   testosterone cypionate 200 MG/ML injection Commonly known as:  DEPOTESTOSTERONE CYPIONATE Inject 200mg  (9ml) intramuscularly every 14 days   tiZANidine 4 MG tablet Commonly known as:  ZANAFLEX Take 4 mg by mouth every 6 (six) hours as needed for muscle spasms.   vitamin C 500 MG tablet Commonly known as:  ASCORBIC ACID Take 500 mg by mouth daily.   Vitamin D 2000 units Caps Take 2,000 Units by mouth 2 (two) times daily.   XARELTO 20 MG Tabs tablet Generic drug:  rivaroxaban Take 20 mg by mouth daily. Last dose on 02/04/2017 at [place], NP. Call  on 02/11/2017.   Specialty:  Urology Contact information: [address] [place] [address] [phone]           Signed: [name] 02/11/2017, 3:31 PM

## 2017-02-11 NOTE — ED Triage Notes (Signed)
Patient states his nephrologist removed his right nephrectomy drain around 230pm today. Ever since he got home from the office he has had nonstop drainage which is pink in color. His partner stated "we are changing the dressing almost every hour and we called the oncall provider and he told us we needed to wait 24 hours and some drainage but we feel like this is more than some it is a lot and I wasn't going to go through this all night.". Patient states he has mild pain and some burning at site. States "when I go to bathroom to urinate I feel it draining from site as well."

## 2017-02-12 ENCOUNTER — Emergency Department (HOSPITAL_COMMUNITY)
Admission: EM | Admit: 2017-02-12 | Discharge: 2017-02-12 | Disposition: A | Discharge status: Home / Self Care | Payer: Medicare Other | Attending: Emergency Medicine | Admitting: Emergency Medicine

## 2017-02-12 DIAGNOSIS — T148XXA Other injury of unspecified body region, initial encounter: Secondary | ICD-10-CM

## 2017-02-12 DIAGNOSIS — L24A9 Irritant contact dermatitis due friction or contact with other specified body fluids: Secondary | ICD-10-CM

## 2017-02-12 NOTE — ED Provider Notes (Signed)
[facility] DEPT Provider Note   CSN: [id number] Arrival date & time: 02/11/17  2154   By signing my name below, I, Soijett Blue, attest that this documentation has been prepared under the direction and in the presence of Ripley Fraise, MD. Electronically Signed: Soijett Blue, ED Scribe. 02/12/17. 12:48 AM.  History   Chief Complaint Chief Complaint  Patient presents with  . S/P Nephrostomy tubes removed  . Drainage from Incision    HPI [name] is a 71 y.o. male with a PMHx of kidney stones, who presents to the Emergency Department complaining of drainage from surgical nephrostomy tube incision site onset 3 PM today. Pt reports associated dysuria. Pt has tried applying bandages with no relief of his symptoms. He notes that he had a large right kidney stone that was surgically removed via nephrolithotomy  He reports that he left the hospital without a foley catheter in place. Pt states that he went to his urologist today at 3 PM and the NL tube was removed with a moderate amount of drainage noted since removal. He notes that he has gone through 4-5 bandages since his urology visit today. He denies fever, vomiting, chills, cough, SOB, abdominal pain, difficulty urinating, and any other symptoms.   The history is provided by the patient and a relative. No language interpreter was used.    Past Medical History:  Diagnosis Date  . A-fib (HCC)    hx.- sinus rhythm now  . Acid reflux   . Acute blood loss anemia   . Anxiety   . Arthritis   . BPH (benign prostatic hyperplasia)   . CAD (coronary artery disease)   . Depression   . Dislocation of internal right hip prosthesis, sequela   . Environmental allergies   . Fracture of wrist 10/23/13   LEFT  . High cholesterol   . History of kidney stones   . HLD (hyperlipidemia)   . Hypertension   . Low testosterone, possible hypogonadism 10/01/2012  . Lumbago   . Metabolic bone disease   . Osteoporosis   . Plaque psoriasis     . Sleep apnea    cpap machine-settings 17  . Thrombocytopenia ([place])   . Thyroid disease    HX GRAVES DISEASE  . Transfusion history    s/p 12'13 hip surgery  . Unsteady gait   . Unsteady gait   . Vertebral compression fracture (HCC)    L1-wears brace    Patient Active Problem List   Diagnosis Date Noted  . Renal calculus, right 02/08/2017  . Protein-calorie malnutrition, severe ([place]) 12/08/2016  . Chronic obstructive pulmonary disease ([place]) 12/08/2016  . Staghorn renal calculus 11/05/2016  . Hyperlipidemia 03/22/2016  . Wound drainage right hip 11/15/2013  . Expected blood loss anemia 09/18/2013  . Hyponatremia 09/18/2013  . S/P right TH revision 09/17/2013  . Nonunion, fracture, Right tibia  07/03/2013  . UTI (urinary tract infection) 07/03/2013  . Pathological fracture due to osteoporosis with delayed healing 04/24/2013  . Osteoporosis with fracture 04/24/2013  . Rash and nonspecific skin eruption 03/08/2013  . Syncope 02/21/2013  . PAF (paroxysmal atrial fibrillation) ([place]) 02/21/2013  . Chronic anticoagulation 02/21/2013  . Fall 02/08/2013  . Physical deconditioning 02/08/2013  . Low back pain radiating to both legs 02/08/2013  . Compression fracture of L1 lumbar vertebra (HCC) 02/08/2013  . Right tibial fracture 02/05/2013  . Obesity, unspecified 02/05/2013  . Osteomyelitis of right wrist ([place]) 11/02/2012  . Anemia 10/06/2012  . HTN (hypertension) 10/03/2012  .  Psoriasis 10/03/2012  . Dyslipidemia 10/03/2012  . Vitamin D deficiency 10/03/2012  . GERD (gastroesophageal reflux disease) 10/03/2012  . Hyperglycemia 10/03/2012  . Anxiety 10/03/2012  . Depression 10/03/2012  . CAD (coronary artery disease) 10/03/2012  . DJD (degenerative joint disease) 10/03/2012  . Chronic gingivitis 10/03/2012  . Low testosterone, possible hypogonadism 10/01/2012  . Normocytic anemia 09/30/2012  . Right femoral fracture ([place]) 09/29/2012  . Right forearm fracture 09/29/2012  .  Atrial fibrillation ([place]) 09/29/2012  . Hypothyroidism 09/29/2012  . History of recurrent UTIs 09/29/2012    Past Surgical History:  Procedure Laterality Date  . APPENDECTOMY    . CARDIAC CATHETERIZATION  2013  . HARDWARE REMOVAL  10/05/2012   Procedure: HARDWARE REMOVAL;  Surgeon: [name], MD;  Location: [facility];  Service: Orthopedics;  Laterality: Right;  REMOVING  STRYKER  GAMMA NAIL  . HARDWARE REMOVAL Right 07/03/2013   Procedure: HARDWARE REMOVAL RIGHT TIBIA ;  Surgeon: Rozanna Box, MD;  Location: [facility];  Service: Orthopedics;  Laterality: Right;  . HIP CLOSED REDUCTION Right 10/15/2013   Procedure: CLOSED MANIPULATION HIP;  Surgeon: [name], MD;  Location: [facility];  Service: Orthopedics;  Laterality: Right;  . HOLMIUM LASER APPLICATION Right 05/19/3809   Procedure: HOLMIUM LASER APPLICATION;  Surgeon: Cleon Gustin, MD;  Location: [facility];  Service: Urology;  Laterality: Right;  . INCISION AND DRAINAGE HIP Right 11/16/2013   Procedure: IRRIGATION AND DEBRIDEMENT RIGHT HIP;  Surgeon: [name], MD;  Location: [facility];  Service: Orthopedics;  Laterality: Right;  . JOINT REPLACEMENT     hip-right x2  . KNEE SURGERY Bilateral 2012   Total knee replacements  . NEPHROLITHOTOMY Right 02/08/2017   Procedure: NEPHROLITHOTOMY PERCUTANEOUS WITH SURGEON ACCESS;  Surgeon: Cleon Gustin, MD;  Location: [facility];  Service: Urology;  Laterality: Right;  . ORIF TIBIA FRACTURE Right 02/06/2013   Procedure: OPEN REDUCTION INTERNAL FIXATION (ORIF) TIBIA FRACTURE WITH IM ROD FIBULA;  Surgeon: Rozanna Box, MD;  Location: [facility];  Service: Orthopedics;  Laterality: Right;  . ORIF TIBIA FRACTURE Right 07/03/2013   Procedure: RIGHT TIBIA NON UNION REPAIR ;  Surgeon: Rozanna Box, MD;  Location: [facility];  Service: Orthopedics;  Laterality: Right;  . ORIF WRIST FRACTURE  10/02/2012   Procedure: OPEN REDUCTION INTERNAL FIXATION (ORIF) WRIST FRACTURE;  Surgeon: Roseanne Kaufman, MD;   Location[facility];  Service: Orthopedics;  Laterality: Right;  WITH   ANTIBIOTIC  CEMENT  . ORIF WRIST FRACTURE Left 10/28/2013   Procedure: OPEN REDUCTION INTERNAL FIXATION (ORIF) WRIST FRACTURE with allograft;  Surgeon: Roseanne Kaufman, MD;  [facility];  Service: Orthopedics;  Laterality: Left;  DVR Plate  . right femur surgery  05/2012  . TOTAL HIP REVISION  10/05/2012   Procedure: TOTAL HIP REVISION;  Surgeon: [name], MD;  Location: [facility];  Service: Orthopedics;  Laterality: Right;  RIGHT TOTAL HIP REVISION  . TOTAL HIP REVISION Right 09/17/2013   Procedure: REVISION RIGHT TOTAL HIP ARTHROPLASTY ;  Surgeon: [name], MD;  Location[facility];  Service: Orthopedics;  Laterality: Right;  . TOTAL HIP REVISION Right 10/26/2013   Procedure: REVISION RIGHT TOTAL HIP ARTHROPLASTY;  Surgeon: [name], MD;  Location: [facility];  Service: Orthopedics;  Laterality: Right;  . TOTAL HIP REVISION  03/2016  . WRIST FRACTURE SURGERY  05/2012       Home Medications    Prior to Admission medications   Medication  Sig Start Date End Date Taking? Authorizing Provider  acetaminophen (TYLENOL) 500 MG tablet Take 500-1,000 mg by mouth every 6 (six) hours as needed (for pain.).    [provider]  amiodarone (PACERONE) 200 MG tablet Take 200 mg by mouth daily.    [provider]  atorvastatin (LIPITOR) 80 MG tablet Take 80 mg by mouth 2 (two) times a week. Monday & Friday evenings ONLY    [provider]  buPROPion (WELLBUTRIN XL) 300 MG 24 hr tablet Take 300 mg by mouth daily with breakfast.     [provider]  Calcium Citrate-Vitamin D (CALCIUM CITRATE + D PO) Take 1 tablet by mouth daily with breakfast. Calcium citrate 400 plus D    [provider]  cefdinir (OMNICEF) 300 MG capsule Take 1 capsule (300 mg total) by mouth 2 (two) times daily. Patient not taking: Reported on 02/02/2017 12/11/16   Lajean Saver, MD  Cholecalciferol (VITAMIN D) 2000  units CAPS Take 2,000 Units by mouth 2 (two) times daily.    [provider]  clonazePAM (KLONOPIN) 1 MG tablet Take one tablet by mouth every 6 hours Patient taking differently: Take 1 mg by mouth every 6 (six) hours as needed for anxiety.  11/15/16   Reed, Tiffany L, DO  denosumab (PROLIA) 60 MG/ML SOLN injection Inject 60 mg into the skin every 6 (six) months. Administer in upper arm, thigh, or abdomen    [provider]  diltiazem (CARDIZEM CD) 240 MG 24 hr capsule Take 240 mg by mouth daily.  05/19/13   [provider]  doxycycline (VIBRA-TABS) 100 MG tablet Take 100 mg by mouth daily.    [provider]  DULoxetine (CYMBALTA) 60 MG capsule Take 60 mg by mouth 2 (two) times daily.     [provider]  EPINEPHrine (EPIPEN 2-PAK) 0.3 mg/0.3 mL IJ SOAJ injection Inject 0.3 mg into the muscle daily as needed (for anaphylactic reaction).    [provider]  etanercept (ENBREL) 50 MG/ML injection Inject 50 mg into the skin 2 (two) times a week. Tuesday & Friday    [provider]  ferrous sulfate 325 (65 FE) MG tablet Take 325 mg by mouth 2 (two) times daily.     [provider]  HYDROcodone-acetaminophen (NORCO) 7.5-325 MG tablet Take 1 tablet by mouth every 6 (six) hours. Patient taking differently: Take 1 tablet by mouth every 6 (six) hours as needed (FOR PAIN.).  11/05/16   Debbe Odea, MD  levothyroxine (SYNTHROID, LEVOTHROID) 200 MCG tablet Take 200 mcg by mouth daily at 6 (six) AM. (0500)    [provider]  loratadine (CLARITIN) 10 MG tablet Take 10 mg by mouth daily with lunch.     [provider]  Multiple Vitamin (MULTIVITAMIN WITH MINERALS) TABS tablet Take 1 tablet by mouth daily. Men's One-A-Day 50+    [provider]  nitroGLYCERIN (NITROSTAT) 0.4 MG SL tablet Place 0.4 mg under the tongue every 5 (five) minutes as needed for chest pain. x3 doses as needed for chest pain    [provider]  omeprazole (PRILOSEC) 40 MG capsule Take 40 mg by mouth daily before breakfast.     [provider]  oxyCODONE-acetaminophen (PERCOCET/ROXICET) 5-325 MG tablet Take 1-2 tablets by mouth every 4 (four) hours as needed for moderate pain. 02/09/17   McKenzie, Candee Furbish, MD  polyethylene glycol (MIRALAX / Floria Raveling) packet Take 17 g by mouth daily as needed for mild constipation.  [provider]  Probiotic Product (PROBIOTIC PO) Take 1 capsule by mouth daily with breakfast.    [provider]  ranolazine (RANEXA) 500 MG 12 hr tablet Take 500 mg by mouth 2 (two) times daily.     [provider]  rivaroxaban (XARELTO) 20 MG TABS tablet Take 20 mg by mouth daily. Last dose on 02/04/2017 at 0800am    [provider]  sulfamethoxazole-trimethoprim (BACTRIM DS,SEPTRA DS) 800-160 MG tablet Take 1 tablet by mouth 2 (two) times daily. 01/03/17   [provider]  Suvorexant (BELSOMRA) 15 MG TABS Take 15 mg by mouth at bedtime as needed (FOR SLEEP.).    [provider]  Tamsulosin HCl (FLOMAX) 0.4 MG CAPS Take 0.4 mg by mouth at bedtime.     [provider]  testosterone cypionate (DEPOTESTOSTERONE CYPIONATE) 200 MG/ML injection Inject 200mg  (9ml) intramuscularly every 14 days 03/30/16   Lauree Chandler, NP  tiZANidine (ZANAFLEX) 4 MG tablet Take 4 mg by mouth every 6 (six) hours as needed for muscle spasms.    [provider]  vitamin C (ASCORBIC ACID) 500 MG tablet Take 500 mg by mouth daily.    [provider]    Family History Family History  Problem Relation Age of Onset  . CAD Father 33  . Arthritis Mother     Social History Social History  Substance Use Topics  . Smoking status: Former Smoker    Quit date: 06/05/2012  . Smokeless tobacco: Never Used  . Alcohol use Yes     Comment: occasional-social     Allergies   Patient has no known allergies.   Review of Systems Review of Systems   Constitutional: Negative for chills and fever.  Respiratory: Negative for cough.   Gastrointestinal: Negative for abdominal pain and vomiting.  Genitourinary: Positive for dysuria. Negative for difficulty urinating.  All other systems reviewed and are negative.    Physical Exam Updated Vital Signs BP (!) 142/83 (BP Location: Right Arm)   Pulse (!) 101   Temp 98 F (36.7 C) (Oral)   Resp 18   Ht 6\' 3"  (1.905 m)   Wt 276 lb (125.2 kg)   SpO2 98%   BMI 34.50 kg/m   Physical Exam CONSTITUTIONAL: Well developed/well nourished HEAD: Normocephalic/atraumatic EYES: EOMI/PERRL ENMT: Mucous membranes moist NECK: supple no meningeal signs SPINE/BACK:entire spine nontender CV: S1/S2 noted, no murmurs/rubs/gallops noted LUNGS: Lungs are clear to auscultation bilaterally, no apparent distress ABDOMEN: soft, nontender, no rebound or guarding, bowel sounds noted throughout abdomen GU: right flank incision noted with large amount of fluid noted.  No erythema or foul smelling drainage noted. No penile lesions noted. No scrotal tenderness. Chaperone present for exam.  NEURO: Pt is awake/alert/appropriate, moves all extremitiesx4.  No facial droop.   EXTREMITIES: pulses normal/equal, full ROM SKIN: warm, color normal PSYCH: no abnormalities of mood noted, alert and oriented to situation  ED Treatments / Results  DIAGNOSTIC STUDIES: Oxygen Saturation is 98% on RA, nl by my interpretation.    COORDINATION OF CARE: 12:43 AM Discussed treatment plan with pt at bedside which includes labs, consult to urology, and pt agreed to plan.   Labs (all labs ordered are listed, but only abnormal results are displayed) Labs Reviewed  URINE CULTURE    EKG  EKG Interpretation None       Radiology No results found.  Procedures Procedures (including critical care time)  Medications Ordered in ED Medications - No data to display  Initial Impression / Assessment and Plan / ED Course  I  have reviewed the triage vital signs and the nursing notes.   D/w dr Karsten Ro with urology He requests to place foley catheter as this should relieve the drainage No labs necessary Foley placed by nursing Pt tolerated well It is flowing with urine His wound was bandaged without any complications Call urology for f/u in 2 days   Final Clinical Impressions(s) / ED Diagnoses   Final diagnoses:  Wound drainage    New Prescriptions New Prescriptions   No medications on file   I personally performed the services described in this documentation, which was scribed in my presence. The recorded information has been reviewed and is accurate.        Ripley Fraise, MD 02/12/17 [phone]

## 2017-02-12 NOTE — Discharge Instructions (Signed)
PLEASE RETURN FOR ANY FEVER ABOVE 100, VOMITING, OR COMPLICATION WITH YOUR CATHETER

## 2017-02-13 LAB — URINE CULTURE: Culture: NO GROWTH

## 2017-02-18 ENCOUNTER — Other Ambulatory Visit: Payer: Self-pay | Admitting: Urology

## 2017-02-24 ENCOUNTER — Other Ambulatory Visit: Payer: Self-pay | Admitting: Urology

## 2017-03-09 NOTE — Patient Instructions (Signed)
[name]  03/09/2017   Your procedure is scheduled on: 03/11/2017    Report to [facility] Main  Entrance Take Honor  elevators to 3rd floor to  [place] at   1130 AM.     Call this number if you have problems the morning of surgery [phone]   Remember: ONLY 1 PERSON MAY GO WITH YOU TO SHORT STAY TO GET  READY MORNING OF YOUR SURGERY.  Do not eat food after midnite.  May have clear liquids from 12 midnite until 0700am then nothing by mouth.      Take these medicines the morning of surgery with A SIP OF WATER: Amiodarone ( Pacerone), Wellbutrin, klonopin if needed, Cardizem, Cymbalta, synthroid, Claritin, Omeprazole, Ranexa                                 You may not have any metal on your body including hair pins and              piercings  Do not wear jewelry, , lotions, powders or perfumes, deodorant         .              Men may shave face and neck.   Do not bring valuables to the hospital. [name].  Contacts, dentures or bridgework may not be worn into surgery.      Patients discharged the day of surgery will not be allowed to drive home.  Name and phone number of your driver:                Please read over the following fact sheets you were given: _____________________________________________________________________             [facility] - Preparing for Surgery Before surgery, you can play an important role.  Because skin is not sterile, your skin needs to be as free of germs as possible.  You can reduce the number of germs on your skin by washing with CHG (chlorahexidine gluconate) soap before surgery.  CHG is an antiseptic cleaner which kills germs and bonds with the skin to continue killing germs even after washing. Please DO NOT use if you have an allergy to CHG or antibacterial soaps.  If your skin becomes reddened/irritated stop using the CHG and inform your nurse when  you arrive at Short Stay. Do not shave (including legs and underarms) for at least 48 hours prior to the first CHG shower.  You may shave your face/neck. Please follow these instructions carefully:  1.  Shower with CHG Soap the night before surgery and the  morning of Surgery.  2.  If you choose to wash your hair, wash your hair first as usual with your  normal  shampoo.  3.  After you shampoo, rinse your hair and body thoroughly to remove the  shampoo.                           4.  Use CHG as you would any other liquid soap.  You can apply chg directly  to the skin and wash  Gently with a scrungie or clean washcloth.  5.  Apply the CHG Soap to your body ONLY FROM THE NECK DOWN.   Do not use on face/ open                           Wound or open sores. Avoid contact with eyes, ears mouth and genitals (private parts).                       Wash face,  Genitals (private parts) with your normal soap.             6.  Wash thoroughly, paying special attention to the area where your surgery  will be performed.  7.  Thoroughly rinse your body with warm water from the neck down.  8.  DO NOT shower/wash with your normal soap after using and rinsing off  the CHG Soap.                9.  Pat yourself dry with a clean towel.            10.  Wear clean pajamas.            11.  Place clean sheets on your bed the night of your first shower and do not  sleep with pets. Day of Surgery : Do not apply any lotions/deodorants the morning of surgery.  Please wear clean clothes to the hospital/surgery center.  FAILURE TO FOLLOW THESE INSTRUCTIONS MAY RESULT IN THE CANCELLATION OF YOUR SURGERY PATIENT SIGNATURE_________________________________  NURSE SIGNATURE__________________________________  ________________________________________________________________________    CLEAR LIQUID DIET   Foods Allowed                                                                     Foods  Excluded  Coffee and tea, regular and decaf                             liquids that you cannot  Plain Jell-O in any flavor                                             see through such as: Fruit ices (not with fruit pulp)                                     milk, soups, orange juice  Iced Popsicles                                    All solid food Carbonated beverages, regular and diet                                    Cranberry, grape and apple juices Sports drinks like Gatorade Lightly seasoned clear broth or consume(fat free) Sugar, honey syrup  Sample Menu Breakfast                                Lunch                                     Supper Cranberry juice                    Beef broth                            Chicken broth Jell-O                                     Grape juice                           Apple juice Coffee or tea                        Jell-O                                      Popsicle                                                Coffee or tea                        Coffee or tea  _____________________________________________________________________

## 2017-03-10 ENCOUNTER — Encounter (HOSPITAL_COMMUNITY)
Admission: RE | Admit: 2017-03-10 | Discharge: 2017-03-10 | Disposition: A | Discharge status: Home / Self Care | Payer: Medicare Other | Source: Ambulatory Visit | Attending: Urology | Admitting: Urology

## 2017-03-10 ENCOUNTER — Encounter (HOSPITAL_COMMUNITY): Payer: Self-pay

## 2017-03-10 DIAGNOSIS — Z87891 Personal history of nicotine dependence: Secondary | ICD-10-CM | POA: Diagnosis not present

## 2017-03-10 DIAGNOSIS — M1990 Unspecified osteoarthritis, unspecified site: Secondary | ICD-10-CM | POA: Diagnosis not present

## 2017-03-10 DIAGNOSIS — N200 Calculus of kidney: Secondary | ICD-10-CM | POA: Diagnosis not present

## 2017-03-10 DIAGNOSIS — D696 Thrombocytopenia, unspecified: Secondary | ICD-10-CM | POA: Diagnosis not present

## 2017-03-10 DIAGNOSIS — M545 Low back pain: Secondary | ICD-10-CM | POA: Diagnosis not present

## 2017-03-10 DIAGNOSIS — Z79899 Other long term (current) drug therapy: Secondary | ICD-10-CM | POA: Diagnosis not present

## 2017-03-10 DIAGNOSIS — N400 Enlarged prostate without lower urinary tract symptoms: Secondary | ICD-10-CM | POA: Diagnosis not present

## 2017-03-10 DIAGNOSIS — I10 Essential (primary) hypertension: Secondary | ICD-10-CM | POA: Diagnosis not present

## 2017-03-10 DIAGNOSIS — G4730 Sleep apnea, unspecified: Secondary | ICD-10-CM | POA: Diagnosis not present

## 2017-03-10 DIAGNOSIS — F329 Major depressive disorder, single episode, unspecified: Secondary | ICD-10-CM | POA: Diagnosis not present

## 2017-03-10 DIAGNOSIS — Z8261 Family history of arthritis: Secondary | ICD-10-CM | POA: Diagnosis not present

## 2017-03-10 DIAGNOSIS — R2681 Unsteadiness on feet: Secondary | ICD-10-CM | POA: Diagnosis not present

## 2017-03-10 DIAGNOSIS — K219 Gastro-esophageal reflux disease without esophagitis: Secondary | ICD-10-CM | POA: Diagnosis not present

## 2017-03-10 DIAGNOSIS — Z7901 Long term (current) use of anticoagulants: Secondary | ICD-10-CM | POA: Diagnosis not present

## 2017-03-10 DIAGNOSIS — F419 Anxiety disorder, unspecified: Secondary | ICD-10-CM | POA: Diagnosis not present

## 2017-03-10 DIAGNOSIS — E7800 Pure hypercholesterolemia, unspecified: Secondary | ICD-10-CM | POA: Diagnosis not present

## 2017-03-10 DIAGNOSIS — I2510 Atherosclerotic heart disease of native coronary artery without angina pectoris: Secondary | ICD-10-CM | POA: Diagnosis not present

## 2017-03-10 DIAGNOSIS — Z96641 Presence of right artificial hip joint: Secondary | ICD-10-CM | POA: Diagnosis not present

## 2017-03-10 DIAGNOSIS — Z8249 Family history of ischemic heart disease and other diseases of the circulatory system: Secondary | ICD-10-CM | POA: Diagnosis not present

## 2017-03-10 DIAGNOSIS — L400 Psoriasis vulgaris: Secondary | ICD-10-CM | POA: Diagnosis not present

## 2017-03-10 DIAGNOSIS — E0500 Thyrotoxicosis with diffuse goiter without thyrotoxic crisis or storm: Secondary | ICD-10-CM | POA: Diagnosis not present

## 2017-03-10 DIAGNOSIS — M810 Age-related osteoporosis without current pathological fracture: Secondary | ICD-10-CM | POA: Diagnosis not present

## 2017-03-10 DIAGNOSIS — Z87442 Personal history of urinary calculi: Secondary | ICD-10-CM | POA: Diagnosis not present

## 2017-03-10 DIAGNOSIS — I4891 Unspecified atrial fibrillation: Secondary | ICD-10-CM | POA: Diagnosis not present

## 2017-03-10 LAB — BASIC METABOLIC PANEL
Anion gap: 9 (ref 5–15)
BUN: 21 mg/dL — ABNORMAL HIGH (ref 6–20)
CALCIUM: 9.2 mg/dL (ref 8.9–10.3)
CO2: 27 mmol/L (ref 22–32)
Chloride: 104 mmol/L (ref 101–111)
Creatinine, Ser: 1.26 mg/dL — ABNORMAL HIGH (ref 0.61–1.24)
GFR, EST NON AFRICAN AMERICAN: 56 mL/min — AB (ref >60)
Glucose, Bld: 128 mg/dL — ABNORMAL HIGH (ref 65–99)
POTASSIUM: 4.6 mmol/L (ref 3.5–5.1)
Sodium: 140 mmol/L (ref 135–145)

## 2017-03-10 LAB — CBC
HEMATOCRIT: 39.9 % (ref 39.0–52.0)
HEMOGLOBIN: 12.4 g/dL — AB (ref 13.0–17.0)
MCH: 27.6 pg (ref 26.0–34.0)
MCHC: 31.1 g/dL (ref 30.0–36.0)
MCV: 88.7 fL (ref 78.0–100.0)
Platelets: 271 10*3/uL (ref 150–400)
RBC: 4.5 MIL/uL (ref 4.22–5.81)
RDW: 18.5 % — ABNORMAL HIGH (ref 11.5–15.5)
WBC: 7.1 10*3/uL (ref 4.0–10.5)

## 2017-03-10 NOTE — Progress Notes (Signed)
BMP done 5/12/09/16 faxed via epic to Dr [name].

## 2017-03-10 NOTE — Progress Notes (Signed)
EKG-12/11/16-epic ECHO-11/03/16-epic \\Ct  angio chest- 12/11/16-epic

## 2017-03-11 ENCOUNTER — Encounter (HOSPITAL_COMMUNITY): Payer: Self-pay | Admitting: *Deleted

## 2017-03-11 ENCOUNTER — Ambulatory Visit (HOSPITAL_COMMUNITY): Payer: Medicare Other | Admitting: Registered Nurse

## 2017-03-11 ENCOUNTER — Ambulatory Visit (HOSPITAL_COMMUNITY): Payer: Medicare Other

## 2017-03-11 ENCOUNTER — Ambulatory Visit (HOSPITAL_COMMUNITY)
Admission: RE | Admit: 2017-03-11 | Discharge: 2017-03-11 | Disposition: A | Discharge status: Home / Self Care | Payer: Medicare Other | Source: Ambulatory Visit | Attending: Urology | Admitting: Urology

## 2017-03-11 ENCOUNTER — Encounter (HOSPITAL_COMMUNITY)
Admission: RE | Disposition: A | Discharge status: Home / Self Care | Payer: Self-pay | Source: Ambulatory Visit | Attending: Urology

## 2017-03-11 DIAGNOSIS — Z87891 Personal history of nicotine dependence: Secondary | ICD-10-CM | POA: Insufficient documentation

## 2017-03-11 DIAGNOSIS — Z7901 Long term (current) use of anticoagulants: Secondary | ICD-10-CM | POA: Insufficient documentation

## 2017-03-11 DIAGNOSIS — Z8261 Family history of arthritis: Secondary | ICD-10-CM | POA: Insufficient documentation

## 2017-03-11 DIAGNOSIS — G4730 Sleep apnea, unspecified: Secondary | ICD-10-CM | POA: Insufficient documentation

## 2017-03-11 DIAGNOSIS — F419 Anxiety disorder, unspecified: Secondary | ICD-10-CM | POA: Diagnosis not present

## 2017-03-11 DIAGNOSIS — Z8249 Family history of ischemic heart disease and other diseases of the circulatory system: Secondary | ICD-10-CM | POA: Insufficient documentation

## 2017-03-11 DIAGNOSIS — Z79899 Other long term (current) drug therapy: Secondary | ICD-10-CM | POA: Insufficient documentation

## 2017-03-11 DIAGNOSIS — E669 Obesity, unspecified: Secondary | ICD-10-CM | POA: Insufficient documentation

## 2017-03-11 DIAGNOSIS — M810 Age-related osteoporosis without current pathological fracture: Secondary | ICD-10-CM | POA: Insufficient documentation

## 2017-03-11 DIAGNOSIS — E0500 Thyrotoxicosis with diffuse goiter without thyrotoxic crisis or storm: Secondary | ICD-10-CM | POA: Insufficient documentation

## 2017-03-11 DIAGNOSIS — R2681 Unsteadiness on feet: Secondary | ICD-10-CM | POA: Insufficient documentation

## 2017-03-11 DIAGNOSIS — N200 Calculus of kidney: Secondary | ICD-10-CM

## 2017-03-11 DIAGNOSIS — E7800 Pure hypercholesterolemia, unspecified: Secondary | ICD-10-CM | POA: Insufficient documentation

## 2017-03-11 DIAGNOSIS — D696 Thrombocytopenia, unspecified: Secondary | ICD-10-CM | POA: Insufficient documentation

## 2017-03-11 DIAGNOSIS — K219 Gastro-esophageal reflux disease without esophagitis: Secondary | ICD-10-CM | POA: Insufficient documentation

## 2017-03-11 DIAGNOSIS — Z87442 Personal history of urinary calculi: Secondary | ICD-10-CM | POA: Insufficient documentation

## 2017-03-11 DIAGNOSIS — I4891 Unspecified atrial fibrillation: Secondary | ICD-10-CM | POA: Diagnosis not present

## 2017-03-11 DIAGNOSIS — I10 Essential (primary) hypertension: Secondary | ICD-10-CM | POA: Insufficient documentation

## 2017-03-11 DIAGNOSIS — I2510 Atherosclerotic heart disease of native coronary artery without angina pectoris: Secondary | ICD-10-CM | POA: Insufficient documentation

## 2017-03-11 DIAGNOSIS — F329 Major depressive disorder, single episode, unspecified: Secondary | ICD-10-CM | POA: Insufficient documentation

## 2017-03-11 DIAGNOSIS — Z96641 Presence of right artificial hip joint: Secondary | ICD-10-CM | POA: Insufficient documentation

## 2017-03-11 DIAGNOSIS — Z6833 Body mass index (BMI) 33.0-33.9, adult: Secondary | ICD-10-CM | POA: Insufficient documentation

## 2017-03-11 DIAGNOSIS — M1990 Unspecified osteoarthritis, unspecified site: Secondary | ICD-10-CM | POA: Insufficient documentation

## 2017-03-11 DIAGNOSIS — N400 Enlarged prostate without lower urinary tract symptoms: Secondary | ICD-10-CM | POA: Insufficient documentation

## 2017-03-11 DIAGNOSIS — L400 Psoriasis vulgaris: Secondary | ICD-10-CM | POA: Insufficient documentation

## 2017-03-11 DIAGNOSIS — M545 Low back pain: Secondary | ICD-10-CM | POA: Insufficient documentation

## 2017-03-11 HISTORY — DX: Unspecified injury of left lower leg, initial encounter: S89.92XA

## 2017-03-11 HISTORY — PX: CYSTOSCOPY/URETEROSCOPY/HOLMIUM LASER/STENT PLACEMENT: SHX6546

## 2017-03-11 SURGERY — CYSTOSCOPY/URETEROSCOPY/HOLMIUM LASER/STENT PLACEMENT
Anesthesia: General | Site: Ureter | Laterality: Right

## 2017-03-11 MED ORDER — PROPOFOL 10 MG/ML IV BOLUS
INTRAVENOUS | Status: DC | PRN
  Administered 2017-03-11: 200 mg via INTRAVENOUS

## 2017-03-11 MED ORDER — PROPOFOL 10 MG/ML IV BOLUS
INTRAVENOUS | Status: AC
Start: 2017-03-11 — End: 2017-03-11
  Filled 2017-03-11: qty 20

## 2017-03-11 MED ORDER — SULFAMETHOXAZOLE-TRIMETHOPRIM 800-160 MG PO TABS: 1.0000 | ORAL_TABLET | Freq: Two times a day (BID) | ORAL | 0 refills | Status: DC

## 2017-03-11 MED ORDER — CEFTRIAXONE SODIUM 2 G IJ SOLR
2.0000 g | Freq: Once | INTRAMUSCULAR | Status: AC
  Administered 2017-03-11: 2 g via INTRAVENOUS
  Filled 2017-03-11: qty 2

## 2017-03-11 MED ORDER — FENTANYL CITRATE (PF) 100 MCG/2ML IJ SOLN
INTRAMUSCULAR | Status: DC | PRN
  Administered 2017-03-11: 25 ug via INTRAVENOUS
  Administered 2017-03-11: 50 ug via INTRAVENOUS
  Administered 2017-03-11: 25 ug via INTRAVENOUS

## 2017-03-11 MED ORDER — IOHEXOL 300 MG/ML  SOLN
INTRAMUSCULAR | Status: DC | PRN
  Administered 2017-03-11: 50 mL

## 2017-03-11 MED ORDER — OXYCODONE-ACETAMINOPHEN 5-325 MG PO TABS: 1.0000 | ORAL_TABLET | ORAL | 0 refills | Status: DC | PRN

## 2017-03-11 MED ORDER — ONDANSETRON HCL 4 MG/2ML IJ SOLN
INTRAMUSCULAR | Status: DC | PRN
  Administered 2017-03-11: 4 mg via INTRAVENOUS

## 2017-03-11 MED ORDER — PHENYLEPHRINE 40 MCG/ML (10ML) SYRINGE FOR IV PUSH (FOR BLOOD PRESSURE SUPPORT)
PREFILLED_SYRINGE | INTRAVENOUS | Status: AC
  Filled 2017-03-11: qty 10

## 2017-03-11 MED ORDER — ONDANSETRON HCL 4 MG/2ML IJ SOLN
INTRAMUSCULAR | Status: AC
  Filled 2017-03-11: qty 2

## 2017-03-11 MED ORDER — EPHEDRINE SULFATE-NACL 50-0.9 MG/10ML-% IV SOSY
PREFILLED_SYRINGE | INTRAVENOUS | Status: DC | PRN
  Administered 2017-03-11: 5 mg via INTRAVENOUS
  Administered 2017-03-11: 10 mg via INTRAVENOUS
  Administered 2017-03-11: 5 mg via INTRAVENOUS

## 2017-03-11 MED ORDER — PHENYLEPHRINE 40 MCG/ML (10ML) SYRINGE FOR IV PUSH (FOR BLOOD PRESSURE SUPPORT)
PREFILLED_SYRINGE | INTRAVENOUS | Status: DC | PRN
  Administered 2017-03-11: 80 ug via INTRAVENOUS

## 2017-03-11 MED ORDER — LIDOCAINE 2% (20 MG/ML) 5 ML SYRINGE
INTRAMUSCULAR | Status: AC
  Filled 2017-03-11: qty 5

## 2017-03-11 MED ORDER — MEPERIDINE HCL 50 MG/ML IJ SOLN: 6.2500 mg | INTRAMUSCULAR | Status: DC | PRN

## 2017-03-11 MED ORDER — PROMETHAZINE HCL 25 MG/ML IJ SOLN: 6.2500 mg | INTRAMUSCULAR | Status: DC | PRN

## 2017-03-11 MED ORDER — MIDAZOLAM HCL 2 MG/2ML IJ SOLN: 0.5000 mg | Freq: Once | INTRAMUSCULAR | Status: DC | PRN

## 2017-03-11 MED ORDER — FENTANYL CITRATE (PF) 100 MCG/2ML IJ SOLN: 25.0000 ug | INTRAMUSCULAR | Status: DC | PRN

## 2017-03-11 MED ORDER — SODIUM CHLORIDE 0.9 % IR SOLN
Status: DC | PRN
  Administered 2017-03-11: 4000 mL

## 2017-03-11 MED ORDER — FENTANYL CITRATE (PF) 100 MCG/2ML IJ SOLN
INTRAMUSCULAR | Status: AC
  Filled 2017-03-11: qty 2

## 2017-03-11 MED ORDER — LACTATED RINGERS IV SOLN
INTRAVENOUS | Status: DC
  Administered 2017-03-11: 12:00:00 via INTRAVENOUS

## 2017-03-11 MED ORDER — LIDOCAINE 2% (20 MG/ML) 5 ML SYRINGE
INTRAMUSCULAR | Status: DC | PRN
  Administered 2017-03-11: 100 mg via INTRAVENOUS

## 2017-03-11 MED ORDER — PROPOFOL 10 MG/ML IV BOLUS
INTRAVENOUS | Status: AC
  Filled 2017-03-11: qty 20

## 2017-03-11 SURGICAL SUPPLY — 27 items
BAG URO CATCHER STRL LF (MISCELLANEOUS) ×3 IMPLANT
BASKET DAKOTA 1.9FR 11X120 (BASKET) IMPLANT
BASKET LASER NITINOL 1.9FR (BASKET) IMPLANT
BSKT STON RTRVL 120 1.9FR (BASKET)
CATH FOLEY LATEX FREE 20FR (CATHETERS)
CATH FOLEY LF 20FR (CATHETERS) IMPLANT
CATH INTERMIT  6FR 70CM (CATHETERS) ×3 IMPLANT
CLOTH BEACON ORANGE TIMEOUT ST (SAFETY) ×3 IMPLANT
COVER SURGICAL LIGHT HANDLE (MISCELLANEOUS) ×3 IMPLANT
EXTRACTOR STONE NITINOL NGAGE (UROLOGICAL SUPPLIES) ×2 IMPLANT
FIBER LASER TRAC TIP (UROLOGICAL SUPPLIES) IMPLANT
GLOVE BIO SURGEON STRL SZ8 (GLOVE) ×3 IMPLANT
GOWN STRL REUS W/TWL XL LVL3 (GOWN DISPOSABLE) ×3 IMPLANT
GUIDEWIRE ANG ZIPWIRE 038X150 (WIRE) ×3 IMPLANT
GUIDEWIRE STR DUAL SENSOR (WIRE) ×3 IMPLANT
IV NS 1000ML (IV SOLUTION) ×3
IV NS 1000ML BAXH (IV SOLUTION) ×1 IMPLANT
MANIFOLD NEPTUNE II (INSTRUMENTS) ×3 IMPLANT
PACK CYSTO (CUSTOM PROCEDURE TRAY) ×3 IMPLANT
SHEATH ACCESS URETERAL 38CM (SHEATH) ×2 IMPLANT
STENT CONTOUR 6FRX26X.038 (STENTS) IMPLANT
STENT URET 6FRX26 CONTOUR (STENTS) ×2 IMPLANT
SYR CONTROL 10ML LL (SYRINGE) IMPLANT
SYRINGE IRR TOOMEY STRL 70CC (SYRINGE) IMPLANT
TUBE FEEDING 8FR 16IN STR KANG (MISCELLANEOUS) IMPLANT
TUBING CONNECTING 10 (TUBING) ×2 IMPLANT
TUBING CONNECTING 10' (TUBING) ×1

## 2017-03-11 NOTE — H&P (Signed)
Urology Admission H&P  Chief Complaint: right renal calculus  History of Present Illness: [name] is a 71yo with a hx of right staghorn calculus who is s/p R PCNl with a 20mm residual fragment. He has infection stones.  Past Medical History:  Diagnosis Date  . A-fib (HCC)    hx.- sinus rhythm now  . Acid reflux   . Acute blood loss anemia   . Anxiety   . Arthritis   . BPH (benign prostatic hyperplasia)   . CAD (coronary artery disease)   . Depression   . Dislocation of internal right hip prosthesis, sequela   . Environmental allergies   . Fracture of wrist 10/23/13   LEFT  . High cholesterol   . History of kidney stones   . HLD (hyperlipidemia)   . Hypertension   . Left knee injury    cap dislocated  . Low testosterone, possible hypogonadism 10/01/2012  . Lumbago   . Metabolic bone disease   . Osteoporosis   . Plaque psoriasis   . Sleep apnea    cpap machine-settings 17  . Thrombocytopenia ([place])   . Thyroid disease    HX GRAVES DISEASE  . Transfusion history    s/p 12'13 hip surgery  . Unsteady gait   . Unsteady gait   . Vertebral compression fracture (HCC)    L1-wears brace   Past Surgical History:  Procedure Laterality Date  . APPENDECTOMY    . CARDIAC CATHETERIZATION  2013  . HARDWARE REMOVAL  10/05/2012   Procedure: HARDWARE REMOVAL;  Surgeon: [name], MD;  Location: [facility];  Service: Orthopedics;  Laterality: Right;  REMOVING  STRYKER  GAMMA NAIL  . HARDWARE REMOVAL Right 07/03/2013   Procedure: HARDWARE REMOVAL RIGHT TIBIA ;  Surgeon: Rozanna Box, MD;  Location: [place];  Service: Orthopedics;  Laterality: Right;  . HIP CLOSED REDUCTION Right 10/15/2013   Procedure: CLOSED MANIPULATION HIP;  Surgeon: [name], MD;  Location: [facility];  Service: Orthopedics;  Laterality: Right;  . HOLMIUM LASER APPLICATION Right 05/19/2118   Procedure: HOLMIUM LASER APPLICATION;  Surgeon: Cleon Gustin, MD;  Location: [facility];  Service: Urology;  Laterality:  Right;  . INCISION AND DRAINAGE HIP Right 11/16/2013   Procedure: IRRIGATION AND DEBRIDEMENT RIGHT HIP;  Surgeon: [name], MD;  Location: [facility];  Service: Orthopedics;  Laterality: Right;  . JOINT REPLACEMENT     hip-right x2  . KNEE SURGERY Bilateral 2012   Total knee replacements  . NEPHROLITHOTOMY Right 02/08/2017   Procedure: NEPHROLITHOTOMY PERCUTANEOUS WITH SURGEON ACCESS;  Surgeon: Cleon Gustin, MD;  Location: [facility];  Service: Urology;  Laterality: Right;  . ORIF TIBIA FRACTURE Right 02/06/2013   Procedure: OPEN REDUCTION INTERNAL FIXATION (ORIF) TIBIA FRACTURE WITH IM ROD FIBULA;  Surgeon: Rozanna Box, MD;  Location: [place];  Service: Orthopedics;  Laterality: Right;  . ORIF TIBIA FRACTURE Right 07/03/2013   Procedure: RIGHT TIBIA NON UNION REPAIR ;  Surgeon: Rozanna Box, MD;  Location: [place];  Service: Orthopedics;  Laterality: Right;  . ORIF WRIST FRACTURE  10/02/2012   Procedure: OPEN REDUCTION INTERNAL FIXATION (ORIF) WRIST FRACTURE;  Surgeon: Roseanne Kaufman, MD;  Location: [facility];  Service: Orthopedics;  Laterality: Right;  WITH   ANTIBIOTIC  CEMENT  . ORIF WRIST FRACTURE Left 10/28/2013   Procedure: OPEN REDUCTION INTERNAL FIXATION (ORIF) WRIST FRACTURE with allograft;  Surgeon: Roseanne Kaufman, MD;  Location: [facility];  Service: Orthopedics;  Laterality:  Left;  DVR Plate  . right femur surgery  05/2012  . TOTAL HIP REVISION  10/05/2012   Procedure: TOTAL HIP REVISION;  Surgeon: [name], MD;  Location: [facility];  Service: Orthopedics;  Laterality: Right;  RIGHT TOTAL HIP REVISION  . TOTAL HIP REVISION Right 09/17/2013   Procedure: REVISION RIGHT TOTAL HIP ARTHROPLASTY ;  Surgeon: [name], MD;  Location: [facility];  Service: Orthopedics;  Laterality: Right;  . TOTAL HIP REVISION Right 10/26/2013   Procedure: REVISION RIGHT TOTAL HIP ARTHROPLASTY;  Surgeon: [name], MD;  Location: [facility];  Service: Orthopedics;  Laterality: Right;  . TOTAL HIP REVISION   03/2016  . WRIST FRACTURE SURGERY  05/2012    Home Medications:  Prescriptions Prior to Admission  Medication Sig Dispense Refill Last Dose  . acetaminophen (TYLENOL) 500 MG tablet Take 500 mg by mouth every 6 (six) hours as needed for mild pain (for pain.).    Past Week at Unknown time  . amiodarone (PACERONE) 200 MG tablet Take 200 mg by mouth daily.   03/11/2017 at 0800  . atorvastatin (LIPITOR) 80 MG tablet Take 80 mg by mouth 2 (two) times a week. Monday & Friday evenings ONLY   Past Week at Unknown time  . buPROPion (WELLBUTRIN XL) 300 MG 24 hr tablet Take 300 mg by mouth daily with breakfast.    03/11/2017 at 0800  . Calcium Citrate-Vitamin D (CALCIUM CITRATE + D PO) Take 1 tablet by mouth 2 (two) times daily. Calcium citrate 400 plus D    03/10/2017 at 0800  . Cholecalciferol (VITAMIN D) 2000 units CAPS Take 2,000 Units by mouth 2 (two) times daily.   03/10/2017 at 0800  . clonazePAM (KLONOPIN) 1 MG tablet Take one tablet by mouth every 6 hours (Patient taking differently: Take 1 mg by mouth every 6 (six) hours as needed for anxiety. ) 120 tablet 0 03/11/2017 at 1000  . diltiazem (CARDIZEM CD) 240 MG 24 hr capsule Take 240 mg by mouth daily.    03/11/2017 at 0800  . doxycycline (VIBRA-TABS) 100 MG tablet Take 100 mg by mouth daily.   03/10/2017 at 0800  . DULoxetine (CYMBALTA) 60 MG capsule Take 60 mg by mouth 2 (two) times daily.    03/11/2017 at 0800  . etanercept (ENBREL) 50 MG/ML injection Inject 50 mg into the skin 2 (two) times a week. Tuesday & Friday   Past Week at Unknown time  . Ferrous Gluconate (IRON 27 PO) Take 1 tablet by mouth 2 (two) times daily.    03/10/2017 at 0800  . HYDROcodone-acetaminophen (NORCO) 7.5-325 MG tablet Take 1 tablet by mouth every 6 (six) hours. (Patient taking differently: Take 1 tablet by mouth every 6 (six) hours as needed (FOR PAIN.). ) 10 tablet 0 03/11/2017 at 0300  . levothyroxine (SYNTHROID, LEVOTHROID) 200 MCG tablet Take 200 mcg by mouth daily at 6 (six) AM.  (0500)   03/11/2017 at 0500  . loratadine (CLARITIN) 10 MG tablet Take 10 mg by mouth daily with lunch.    03/11/2017 at 0800  . mirabegron ER (MYRBETRIQ) 50 MG TB24 tablet Take 50 mg by mouth daily.   03/10/2017 at 0800  . Multiple Vitamin (MULTIVITAMIN WITH MINERALS) TABS tablet Take 1 tablet by mouth daily. Men's One-A-Day 50+   03/10/2017 at 0800  . omeprazole (PRILOSEC) 40 MG capsule Take 40 mg by mouth daily before breakfast.    03/11/2017 at 0800  . Probiotic Product (PROBIOTIC PO) Take  1 capsule by mouth daily with breakfast.   03/10/2017 at 0800  . ranolazine (RANEXA) 500 MG 12 hr tablet Take 500 mg by mouth 2 (two) times daily.    03/11/2017 at 0800  . rivaroxaban (XARELTO) 20 MG TABS tablet Take 20 mg by mouth daily.    03/10/2017 at 0800  . Tamsulosin HCl (FLOMAX) 0.4 MG CAPS Take 0.4 mg by mouth at bedtime.    03/10/2017 at 2300  . testosterone cypionate (DEPOTESTOSTERONE CYPIONATE) 200 MG/ML injection Inject 200mg  (64ml) intramuscularly every 14 days (Patient taking differently: Inject 200 mg into the muscle every 14 (fourteen) days. Inject 200mg  (58ml) intramuscularly every 14 days Last dose was 03-04-17) 10 mL 0 Past Month at Unknown time  . tiZANidine (ZANAFLEX) 4 MG tablet Take 4 mg by mouth every 6 (six) hours as needed for muscle spasms.   03/11/2017 at 0300  . vitamin C (ASCORBIC ACID) 500 MG tablet Take 500 mg by mouth daily.   03/10/2017 at 0800  . amoxicillin (AMOXIL) 500 MG capsule Take 2,000 mg by mouth See admin instructions. Takes 4 capsules 1 hour prior to dental procedures   More than a month at Unknown time  . cefdinir (OMNICEF) 300 MG capsule Take 1 capsule (300 mg total) by mouth 2 (two) times daily. (Patient not taking: Reported on 02/02/2017) 20 capsule 0 Completed Course at Unknown time  . denosumab (PROLIA) 60 MG/ML SOLN injection Inject 60 mg into the skin every 6 (six) months. Administer in upper arm, thigh, or abdomen   More than a month at Unknown time  . EPINEPHrine (EPIPEN  2-PAK) 0.3 mg/0.3 mL IJ SOAJ injection Inject 0.3 mg into the muscle daily as needed (for anaphylactic reaction).   More than a month at Unknown time  . nitroGLYCERIN (NITROSTAT) 0.4 MG SL tablet Place 0.4 mg under the tongue every 5 (five) minutes as needed for chest pain. x3 doses as needed for chest pain   More than a month at Unknown time  . oxyCODONE-acetaminophen (PERCOCET/ROXICET) 5-325 MG tablet Take 1-2 tablets by mouth every 4 (four) hours as needed for moderate pain. (Patient not taking: Reported on 02/12/2017) 30 tablet 0 Not Taking at Unknown time  . polyethylene glycol (MIRALAX / GLYCOLAX) packet Take 17 g by mouth daily as needed for mild constipation.    More than a month at Unknown time  . Suvorexant (BELSOMRA) 15 MG TABS Take 15 mg by mouth at bedtime as needed (FOR SLEEP.).   More than a month at Unknown time   Allergies: No Known Allergies  Family History  Problem Relation Age of Onset  . CAD Father 53  . Arthritis Mother    Social History:  reports that he quit smoking about 4 years ago. He has never used smokeless tobacco. He reports that he drinks alcohol. He reports that he does not use drugs.  Review of Systems  All other systems reviewed and are negative.   Physical Exam:  Vital signs in last 24 hours: Temp:  [97.3 F (36.3 C)] 97.3 F (36.3 C) (06/01 1222) Pulse Rate:  [107] 107 (06/01 1222) Resp:  [18] 18 (06/01 1222) BP: (147)/(89) 147/89 (06/01 1222) SpO2:  [99 %] 99 % (06/01 1222) Weight:  [122.5 kg (270 lb)] 122.5 kg (270 lb) (06/01 1222) Physical Exam  Constitutional: He is oriented to person, place, and time. He appears well-developed and well-nourished.  HENT:  Head: Normocephalic and atraumatic.  Eyes: EOM are normal. Pupils are equal, round, and  reactive to light.  Neck: Normal range of motion. No thyromegaly present.  Cardiovascular: Normal rate and regular rhythm.   Respiratory: Effort normal. No respiratory distress.  GI: Soft. He exhibits no  distension.  Musculoskeletal: Normal range of motion. He exhibits no edema.  Neurological: He is alert and oriented to person, place, and time.  Skin: Skin is warm and dry.  Psychiatric: He has a normal mood and affect. His behavior is normal. Judgment and thought content normal.    Laboratory Data:  No results found for this or any previous visit (from the past 24 hour(s)). No results found for this or any previous visit (from the past 240 hour(s)). Creatinine:  Recent Labs  03/10/17 0813  CREATININE 1.26*   Baseline Creatinine: 1.2  Impression/Assessment:  70yo with right renal calculus  Plan:  The risks/benefits/alternatives to right ureteroscopic stone extraction was explained to the patient and he understands and wishes to proceed with surgery  [name] 03/11/2017, 12:59 PM

## 2017-03-11 NOTE — Transfer of Care (Signed)
Immediate Anesthesia Transfer of Care Note  Patient: [name]  Procedure(s) Performed: Procedure(s): CYSTOSCOPY/URETEROSCOPYSTENT PLACEMENT right ureter retrograde pylegram (Right)  Patient Location: PACU  Anesthesia Type:General  Level of Consciousness: awake, alert , oriented and patient cooperative  Airway & Oxygen Therapy: Patient Spontanous Breathing and Patient connected to face mask oxygen  Post-op Assessment: Report given to RN, Post -op Vital signs reviewed and stable and Patient moving all extremities  Post vital signs: Reviewed and stable  Last Vitals:  Vitals:   03/11/17 1222  BP: (!) 147/89  Pulse: (!) 107  Resp: 18  Temp: 36.3 C    Last Pain:  Vitals:   03/11/17 1222  TempSrc: Oral  PainSc:       Patients Stated Pain Goal: 5 (83/07/46 0029)  Complications: No apparent anesthesia complications

## 2017-03-11 NOTE — Discharge Instructions (Signed)
Ureteral Stent Implantation, Care After Refer to this sheet in the next few weeks. These instructions provide you with information about caring for yourself after your procedure. Your health care provider may also give you more specific instructions. Your treatment has been planned according to current medical practices, but problems sometimes occur. Call your health care provider if you have any problems or questions after your procedure. What can I expect after the procedure? After the procedure, it is common to have:  Nausea.  Mild pain when you urinate. You may feel this pain in your lower back or lower abdomen. Pain should stop within a few minutes after you urinate. This may last for up to 1 week.  A small amount of blood in your urine for several days.  Follow these instructions at home:  Medicines  Take over-the-counter and prescription medicines only as told by your health care provider.  If you were prescribed an antibiotic medicine, take it as told by your health care provider. Do not stop taking the antibiotic even if you start to feel better.  Do not drive for 24 hours if you received a sedative.  Do not drive or operate heavy machinery while taking prescription pain medicines. Activity  Return to your normal activities as told by your health care provider. Ask your health care provider what activities are safe for you.  Do not lift anything that is heavier than 10 lb (4.5 kg). Follow this limit for 1 week after your procedure, or for as long as told by your health care provider. General instructions  Watch for any blood in your urine. Call your health care provider if the amount of blood in your urine increases.  If you have a catheter: ? Follow instructions from your health care provider about taking care of your catheter and collection bag. ? Do not take baths, swim, or use a hot tub until your health care provider approves.  Drink enough fluid to keep your urine  clear or pale yellow.  Keep all follow-up visits as told by your health care provider. This is important. Contact a health care provider if:  You have pain that gets worse or does not get better with medicine, especially pain when you urinate.  You have difficulty urinating.  You feel nauseous or you vomit repeatedly during a period of more than 2 days after the procedure. Get help right away if:  Your urine is dark red or has blood clots in it.  You are leaking urine (have incontinence).  The end of the stent comes out of your urethra.  You cannot urinate.  You have sudden, sharp, or severe pain in your abdomen or lower back.  You have a fever. This information is not intended to replace advice given to you by your health care provider. Make sure you discuss any questions you have with your health care provider. Document Released: 05/30/2013 Document Revised: 03/04/2016 Document Reviewed: 04/11/2015 Elsevier Interactive Patient Education  2018 [place] IN 72 HOURS BY PULLING THE [employer]

## 2017-03-11 NOTE — Anesthesia Procedure Notes (Signed)
Procedure Name: LMA Insertion Date/Time: 03/11/2017 1:33 PM Performed by: Carleene Cooper A Pre-anesthesia Checklist: Patient identified, Emergency Drugs available, Suction available, Patient being monitored and Timeout performed Patient Re-evaluated:Patient Re-evaluated prior to inductionOxygen Delivery Method: Circle system utilized Preoxygenation: Pre-oxygenation with 100% oxygen Intubation Type: IV induction LMA: LMA with gastric port inserted LMA Size: 5.0 Number of attempts: 1 Placement Confirmation: positive ETCO2 and breath sounds checked- equal and bilateral Tube secured with: Tape Dental Injury: Teeth and Oropharynx as per pre-operative assessment

## 2017-03-11 NOTE — Anesthesia Preprocedure Evaluation (Signed)
Anesthesia Evaluation  Patient identified by MRN, date of birth, ID band Patient awake    Reviewed: Allergy & Precautions, NPO status , Patient's Chart, lab work & pertinent test results  History of Anesthesia Complications Negative for: history of anesthetic complications  Airway Mallampati: II  TM Distance: >3 FB Neck ROM: Full    Dental  (+) Poor Dentition, Dental Advisory Given   Pulmonary sleep apnea and Continuous Positive Airway Pressure Ventilation , former smoker (quit 2013),    breath sounds clear to auscultation       Cardiovascular hypertension, Pt. on medications (-) angina+ CAD  + dysrhythmias Atrial Fibrillation  Rhythm:Irregular Rate:Normal  Pt declines stress test b/c he is asymptomatic 1/18 ECHO: EF 60-65%, valves OK   Neuro/Psych Anxiety Depression negative neurological ROS     GI/Hepatic Neg liver ROS, GERD  Medicated,  Endo/Other  Hypothyroidism   Renal/GU Renal InsufficiencyRenal disease     Musculoskeletal  (+) Arthritis ,   Abdominal (+) + obese,   Peds  Hematology Xarelto (last dose yesterday)   Anesthesia Other Findings   Reproductive/Obstetrics                             Anesthesia Physical Anesthesia Plan  ASA: III  Anesthesia Plan: General   Post-op Pain Management:    Induction: Intravenous  Airway Management Planned: LMA  Additional Equipment:   Intra-op Plan:   Post-operative Plan:   Informed Consent: I have reviewed the patients History and Physical, chart, labs and discussed the procedure including the risks, benefits and alternatives for the proposed anesthesia with the patient or authorized representative who has indicated his/her understanding and acceptance.   Dental advisory given  Plan Discussed with: CRNA and Surgeon  Anesthesia Plan Comments: (Plan routine monitors, GA- LMA OK)        Anesthesia Quick Evaluation

## 2017-03-11 NOTE — Op Note (Signed)
.  Preoperative diagnosis: RIGHT renal calculi  Postoperative diagnosis: Same  Procedure: 1 cystoscopy 2.  Right retrograde pyelography 3.  Intraoperative fluoroscopy, under one hour, with interpretation 4.  right ureteroscopic stone manipulation with basket extraction 5.  right 6 x 26 JJ stent exchange  Attending: Rosie Fate  Anesthesia: General  Estimated blood loss: None  Drains: right 6 x 26 JJ ureteral stent with tether  Specimens: stone for analysis  Antibiotics: rocephin  Findings: right mid and lower pole renal calculi. No hydronephrosis. No masses/lesions in the bladder. Ureteral orifices in normal anatomic location.  Indications: Patient is a 71 year old male with a history of a staghorn right enal calculus who underwent rigth PCNL. He has a 73mm residual fragment. After discussing treatment options, they decided proceed with right ureteroscopic stone manipulation.  Procedure her in detail: The patient was brought to the operating room and a brief timeout was done to ensure correct patient, correct procedure, correct site.  General anesthesia was administered patient was placed in dorsal lithotomy position.  Her genitalia was then prepped and draped in usual sterile fashion.  A rigid 57 French cystoscope was passed in the urethra and the bladder.  Bladder was inspected free masses or lesions.  the ureteral orifices were in the normal orthotopic locations. Using a grasper the right ureteral stent was brought to th urethral meatus. We then advanced a zipwire through the stent and up to the renal pelvis.  We then cannulated the right ureteral orifice with a  6 french catheter. A gentle retrograde was obtained and findings noted above. we then removed the cystoscope and cannulated the left ureteral orifice with a semirigid ureteroscope.  We located no stone in the ureter. We then placed a sensor wire up to the renal pelvis. We removed the scope and advanced a 12/14 x 38cm access  sheath up to the renal pelvis. We then used the flexible ureteroscope to perform nephroscopy. We located calculi in the mid and lower poles which were removed with an NGage basket. Once the stone were removed we then removed the access sheath under direct vision and noted to injury to the ureter. we then placed a 6 x 26 double-j ureteral stent over the original zip wire.  We then removed the wire and good coil was noted in the the renal pelvis under fluoroscopy and the bladder under direct vision.   the bladder was then drained and this concluded the procedure which was well tolerated by patient.  Complications: None  Condition: Stable, extubated, transferred to PACU  Plan: Patient is to be discharged home as to follow-up in 2 weeks. He is to remove his stent by pulling the tehter in 72 hours

## 2017-03-11 NOTE — Anesthesia Postprocedure Evaluation (Signed)
Anesthesia Post Note  Patient: [name]  Procedure(s) Performed: Procedure(s) (LRB): CYSTOSCOPY/URETEROSCOPYSTENT PLACEMENT right ureter retrograde pylegram (Right)     Patient location during evaluation: PACU Anesthesia Type: General Level of consciousness: awake and alert, patient cooperative and oriented Pain management: pain level controlled Vital Signs Assessment: post-procedure vital signs reviewed and stable Respiratory status: spontaneous breathing, nonlabored ventilation and respiratory function stable Cardiovascular status: blood pressure returned to baseline and stable Postop Assessment: no signs of nausea or vomiting Anesthetic complications: no    Last Vitals:  Vitals:   03/11/17 1512 03/11/17 1600  BP: (!) 150/77 (!) 167/83  Pulse: 84 96  Resp: 18 16  Temp: 36.6 C 36.6 C    Last Pain:  Vitals:   03/11/17 1526  TempSrc:   PainSc: 2                  Torell Minder,E. Siddhartha Hoback

## 2017-03-12 ENCOUNTER — Encounter (HOSPITAL_COMMUNITY): Payer: Self-pay | Admitting: Urology

## 2017-03-14 NOTE — Anesthesia Postprocedure Evaluation (Signed)
Anesthesia Post Note  Patient: [name]  Procedure(s) Performed: Procedure(s) (LRB): NEPHROLITHOTOMY PERCUTANEOUS WITH SURGEON ACCESS (Right) HOLMIUM LASER APPLICATION (Right)     Anesthesia Post Evaluation  Last Vitals:  Vitals:   02/08/17 2031 02/09/17 0454  BP: 137/64 (!) 165/86  Pulse: (!) 104 (!) 108  Resp: 16 16  Temp: 36.4 C 36.9 C    Last Pain:  Vitals:   02/09/17 1358  TempSrc:   PainSc: 5                  Jovian Lembcke S

## 2017-03-14 NOTE — Addendum Note (Signed)
Addendum  created 03/14/17 1406 by Kelissa Merlin, MD   Sign clinical note    

## 2017-03-23 ENCOUNTER — Encounter (HOSPITAL_COMMUNITY): Payer: Medicare Other

## 2017-04-01 ENCOUNTER — Telehealth: Payer: Self-pay | Admitting: Cardiovascular Disease

## 2017-04-01 NOTE — Telephone Encounter (Signed)
Received records from [facility] for appointment on 06/08/17 with Dr Oval Linsey.  Records put with Dr Blenda Mounts schedule for 06/08/17. lp

## 2017-04-08 ENCOUNTER — Encounter (HOSPITAL_COMMUNITY)
Admission: RE | Admit: 2017-04-08 | Discharge: 2017-04-08 | Disposition: A | Discharge status: Home / Self Care | Payer: Medicare Other | Source: Ambulatory Visit | Attending: Orthopedic Surgery | Admitting: Orthopedic Surgery

## 2017-04-08 ENCOUNTER — Encounter (HOSPITAL_COMMUNITY): Payer: Self-pay

## 2017-04-08 HISTORY — DX: Hypothyroidism, unspecified: E03.9

## 2017-04-08 LAB — URINALYSIS, ROUTINE W REFLEX MICROSCOPIC
BACTERIA UA: NONE SEEN
BILIRUBIN URINE: NEGATIVE
Glucose, UA: NEGATIVE mg/dL
KETONES UR: NEGATIVE mg/dL
LEUKOCYTES UA: NEGATIVE
NITRITE: NEGATIVE
PROTEIN: NEGATIVE mg/dL
Specific Gravity, Urine: 1.014 (ref 1.005–1.030)
pH: 6 (ref 5.0–8.0)

## 2017-04-08 LAB — BASIC METABOLIC PANEL
Anion gap: 7 (ref 5–15)
BUN: 13 mg/dL (ref 6–20)
CHLORIDE: 100 mmol/L — AB (ref 101–111)
CO2: 24 mmol/L (ref 22–32)
CREATININE: 1.04 mg/dL (ref 0.61–1.24)
Calcium: 9 mg/dL (ref 8.9–10.3)
GFR calc Af Amer: >60 mL/min (ref >60)
GFR calc non Af Amer: >60 mL/min (ref >60)
Glucose, Bld: 97 mg/dL (ref 65–99)
POTASSIUM: 4.4 mmol/L (ref 3.5–5.1)
SODIUM: 131 mmol/L — AB (ref 135–145)

## 2017-04-08 LAB — CBC WITH DIFFERENTIAL/PLATELET
Basophils Absolute: 0 10*3/uL (ref 0.0–0.1)
Basophils Relative: 0 %
EOS ABS: 0.1 10*3/uL (ref 0.0–0.7)
EOS PCT: 2 %
HCT: 38.1 % — ABNORMAL LOW (ref 39.0–52.0)
HEMOGLOBIN: 12.1 g/dL — AB (ref 13.0–17.0)
LYMPHS PCT: 31 %
Lymphs Abs: 1.4 10*3/uL (ref 0.7–4.0)
MCH: 27.8 pg (ref 26.0–34.0)
MCHC: 31.8 g/dL (ref 30.0–36.0)
MCV: 87.4 fL (ref 78.0–100.0)
MONOS PCT: 7 %
Monocytes Absolute: 0.3 10*3/uL (ref 0.1–1.0)
NEUTROS PCT: 60 %
Neutro Abs: 2.7 10*3/uL (ref 1.7–7.7)
Platelets: 214 10*3/uL (ref 150–400)
RBC: 4.36 MIL/uL (ref 4.22–5.81)
RDW: 17 % — ABNORMAL HIGH (ref 11.5–15.5)
WBC: 4.6 10*3/uL (ref 4.0–10.5)

## 2017-04-08 LAB — TYPE AND SCREEN
ABO/RH(D): A POS
ANTIBODY SCREEN: NEGATIVE

## 2017-04-08 LAB — PROTIME-INR
INR: 1.45
Prothrombin Time: 17.8 seconds — ABNORMAL HIGH (ref 11.4–15.2)

## 2017-04-08 LAB — SURGICAL PCR SCREEN
MRSA, PCR: NEGATIVE
STAPHYLOCOCCUS AUREUS: NEGATIVE

## 2017-04-08 LAB — APTT: aPTT: 47 seconds — ABNORMAL HIGH (ref 24–36)

## 2017-04-08 NOTE — Progress Notes (Signed)
PCP - [name] Cardiologist - [name] currently, pt to start seeing Skeet Latch after surgery per patient.   EKG - 12/11/16 ECHO - 11/03/16 Cardiac Cath - pt reports cardiac cath at [facility] in last 5-10 years. Report requested.  Pt with hx Afib and takes Xarelto. Pt states Dr. Mayer Camel told him not to stop taking this medicine prior to surgery.   Sleep Study - unsure where this is done, but pressure settings were 17 CPAP -   Pt takes Enbrel injections. [name] (patients husband) to call prescribing MD regarding whether to take this prior to surgery. Cannot remember name of MD at PAT appointment.   Patient denies shortness of breath, fever, cough and chest pain at PAT appointment  Patient verbalized understanding of instructions that were given to them at the PAT appointment. Patient was also instructed that they will need to review over the PAT instructions again at home before surgery.

## 2017-04-08 NOTE — Pre-Procedure Instructions (Signed)
[name]  04/08/2017      [place] [place] [address] Phone: [phone] Fax: [phone]  [id number] [address], [place] [address] [place] [address] Phone: [phone] Fax: [phone]    Your procedure is scheduled on Wednesday July 11.  Report to [facility] Admitting at 10:45 A.M.  Call this number if you have problems the morning of surgery:  [phone]   Remember:  Do not eat food or drink liquids after midnight.  Take these medicines the morning of surgery with A SIP OF WATER: cardizem (diltiazem), amiodarone (pacerone), bupropion (welllbutrin), doxycicline (Vibra-tabs), duloxetine (cymbalta), levothyroxine (synthroid), omeprazole (prilosec), ranolazine (Ranexa), tizanidine (zanaflex) if needed, loratidine (claritin), hydorocodone (Norco) if needed  7 days prior to surgery STOP taking any Aspirin, Aleve, Naproxen, Ibuprofen, Motrin, Advil, Goody's, BC's, all herbal medications, fish oil, and all vitamins  FOLLOW MD's instructions on stopping XARELTO   Do not wear jewelry, make-up or nail polish.  Do not wear lotions, powders, or perfumes, or deoderant.  Men may shave face and neck.  Do not bring valuables to the hospital.  [facility] is not responsible for any belongings or valuables.  Contacts, dentures or bridgework may not be worn into surgery.  Leave your suitcase in the car.  After surgery it may be brought to your room.  For patients admitted to the hospital, discharge time will be determined by your treatment team.  Patients discharged the day of surgery will not be allowed to drive home.    Special instructions:    [place]- Preparing For Surgery  Before surgery, you can play an important role. Because skin is not sterile, your skin needs to be as free of germs as possible. You  can reduce the number of germs on your skin by washing with CHG (chlorahexidine gluconate) Soap before surgery.  CHG is an antiseptic cleaner which kills germs and bonds with the skin to continue killing germs even after washing.  Please do not use if you have an allergy to CHG or antibacterial soaps. If your skin becomes reddened/irritated stop using the CHG.  Do not shave (including legs and underarms) for at least 48 hours prior to first CHG shower. It is OK to shave your face.  Please follow these instructions carefully.   1. Shower the NIGHT BEFORE SURGERY and the MORNING OF SURGERY with CHG.   2. If you chose to wash your hair, wash your hair first as usual with your normal shampoo.  3. After you shampoo, rinse your hair and body thoroughly to remove the shampoo.  4. Use CHG as you would any other liquid soap. You can apply CHG directly to the skin and wash gently with a scrungie or a clean washcloth.   5. Apply the CHG Soap to your body ONLY FROM THE NECK DOWN.  Do not use on open wounds or open sores. Avoid contact with your eyes, ears, mouth and genitals (private parts). Wash genitals (private parts) with your normal soap.  6. Wash thoroughly, paying special attention to the area where your surgery will be performed.  7. Thoroughly rinse your body with warm water from the neck down.  8. DO NOT shower/wash with your normal soap after using and rinsing off the CHG Soap.  9. Pat yourself dry with a CLEAN TOWEL.   10. Wear CLEAN PAJAMAS  11. Place CLEAN SHEETS on your bed the night of your first shower and DO NOT SLEEP WITH PETS.    Day of Surgery: Do not apply any deodorants/lotions. Please wear clean clothes to the hospital/surgery center.

## 2017-04-08 NOTE — Progress Notes (Addendum)
Anesthesia Chart Review:  Pt is a 71 year old male scheduled for L total knee revision patella on 04/11/2017 with Frederik Pear, MD  - PCP is Janie Morning, DO (notes in care everywhere)  - Was seeing cardiologist Jenne Campus, MD who cleared pt for surgery (notes in care everywhere), last office visit 08/30/16.  Will be transferring to new cardiologist Skeet Latch, MD in August.   PMH includes:  CAD (20% LAD, 100% RCA with good collateral flow 12/2011), atrial fibrillation, HTN, hyperlipidemia, OSA, history Graves' disease, hypothyroidism, thrombocytopenia, GERD. Former smoker. BMI 35.   Medications include: Amiodarone, Lipitor, denosumab, diltiazem, doxycycline, Enbrel, iron, levothyroxine, Macrodantin, Prilosec, Ranexa, xarelto. Last dose xarelto 04/07/17.   Preoperative labs reviewed.   - PT 17.8, PTT 47. Will recheck DOS.   CXR 12/11/16: No active cardiopulmonary disease.  CT angio chest 12/11/16:  1. Technically adequate exam showing no acute pulmonary embolus. 2. Three-vessel coronary artery disease. 3. Mild emphysematous changes in the upper lobes. 4. Remote rib fractures. Probably chronic superior endplate fracture of T6.  EKG 12/11/16: Atrial fibrillation (105 bpm). Non-specific intra-ventricular conduction delay  Echo 11/03/16:  - Left ventricle: Systolic function was normal. The estimated ejection fraction was in the range of 60% to 65%. The study is not technically sufficient to allow evaluation of LV diastolic function. - Left atrium: Severely dilated. - Right atrium: Severely dilated. - Impressions: Technically difficult study with poor echo windows. The LVEF is grossly normal at 60-65%, there is severe biatrial enlargment, the IVC is dilated.  Carotid duplex 02/21/13: No significant extracranial carotid artery stenosis demonstrated. Vertebrals are patent with antegrade flow.  Cardiac cath 12/31/11 ([facility], found in correspondence 07/07/13 in media tab): 1. LAD: Mid 20%  stenosis 2. RCA mid 100% stenosis. Moderate collateral flow from proximal RCA to distal RCA. Good collateral flow from distal LAD to RPDA. Good collateral flow from the second OM to the distal RCA.  If labs acceptable DOS, I anticipate pt can proceed as scheduled.   Willeen Cass, FNP-BC [facility]/Anesthesiology Phone: [phone] 04/08/2017 4:49 PM

## 2017-04-09 DIAGNOSIS — T84093A Other mechanical complication of internal left knee prosthesis, initial encounter: Secondary | ICD-10-CM

## 2017-04-09 NOTE — H&P (Signed)
TOTAL KNEE REVISION ADMISSION H&P  Patient is being admitted for left revision total knee arthroplasty.  Subjective:  Chief Complaint:left knee pain.  HPI: [name], 71 y.o. male, has a history of pain and functional disability in the left knee(s) due to failed previous arthroplasty and patient has failed non-surgical conservative treatments for greater than 12 weeks to include NSAID's and/or analgesics, flexibility and strengthening excercises, use of assistive devices, weight reduction as appropriate and activity modification. The indications for the revision of the total knee arthroplasty are loosening of one or more components. Onset of symptoms was abrupt starting 1 years ago with gradually worsening course since that time.  Prior procedures on the left knee(s) include arthroplasty.  Patient currently rates pain in the left knee(s) at 10 out of 10 with activity. There is night pain, worsening of pain with activity and weight bearing, pain that interferes with activities of daily living and pain with passive range of motion.  Patient has evidence of prosthetic loosening by imaging studies. This condition presents safety issues increasing the risk of falls.  There is no current active infection.  Patient Active Problem List   Diagnosis Date Noted  . Renal calculus, right 02/08/2017  . Protein-calorie malnutrition, severe ([place]) 12/08/2016  . Chronic obstructive pulmonary disease ([place]) 12/08/2016  . Staghorn renal calculus 11/05/2016  . Hyperlipidemia 03/22/2016  . Wound drainage right hip 11/15/2013  . Expected blood loss anemia 09/18/2013  . Hyponatremia 09/18/2013  . S/P right TH revision 09/17/2013  . Nonunion, fracture, Right tibia  07/03/2013  . UTI (urinary tract infection) 07/03/2013  . Pathological fracture due to osteoporosis with delayed healing 04/24/2013  . Osteoporosis with fracture 04/24/2013  . Rash and nonspecific skin eruption 03/08/2013  . Syncope 02/21/2013  .  PAF (paroxysmal atrial fibrillation) ([place]) 02/21/2013  . Chronic anticoagulation 02/21/2013  . Fall 02/08/2013  . Physical deconditioning 02/08/2013  . Low back pain radiating to both legs 02/08/2013  . Compression fracture of L1 lumbar vertebra (HCC) 02/08/2013  . Right tibial fracture 02/05/2013  . Obesity, unspecified 02/05/2013  . Osteomyelitis of right wrist ([place]) 11/02/2012  . Anemia 10/06/2012  . HTN (hypertension) 10/03/2012  . Psoriasis 10/03/2012  . Dyslipidemia 10/03/2012  . Vitamin D deficiency 10/03/2012  . GERD (gastroesophageal reflux disease) 10/03/2012  . Hyperglycemia 10/03/2012  . Anxiety 10/03/2012  . Depression 10/03/2012  . CAD (coronary artery disease) 10/03/2012  . DJD (degenerative joint disease) 10/03/2012  . Chronic gingivitis 10/03/2012  . Low testosterone, possible hypogonadism 10/01/2012  . Normocytic anemia 09/30/2012  . Right femoral fracture ([place]) 09/29/2012  . Right forearm fracture 09/29/2012  . Atrial fibrillation ([place]) 09/29/2012  . Hypothyroidism 09/29/2012  . History of recurrent UTIs 09/29/2012   Past Medical History:  Diagnosis Date  . A-fib (HCC)    hx.- sinus rhythm now  . Acid reflux   . Acute blood loss anemia   . Anxiety   . Arthritis   . BPH (benign prostatic hyperplasia)   . CAD (coronary artery disease)   . Depression   . Dislocation of internal right hip prosthesis, sequela   . Environmental allergies   . Fracture of wrist 10/23/13   LEFT  . High cholesterol   . History of kidney stones   . HLD (hyperlipidemia)   . Hypertension   . Hypothyroidism    thyroid removed  . Left knee injury    cap dislocated  . Low testosterone, possible hypogonadism 10/01/2012  . Lumbago   .  Metabolic bone disease   . Osteoporosis   . Plaque psoriasis   . Sleep apnea    cpap machine-settings 17  . Thrombocytopenia ([place])   . Thyroid disease    HX GRAVES DISEASE  . Transfusion history    s/p 12'13 hip surgery  . Unsteady gait    . Unsteady gait   . Vertebral compression fracture (HCC)    L1-wears brace    Past Surgical History:  Procedure Laterality Date  . APPENDECTOMY    . CARDIAC CATHETERIZATION  2013  . CYSTOSCOPY/URETEROSCOPY/HOLMIUM LASER/STENT PLACEMENT Right 03/11/2017   Procedure: CYSTOSCOPY/URETEROSCOPYSTENT PLACEMENT right ureter retrograde pylegram;  Surgeon: Cleon Gustin, MD;  Location: [facility];  Service: Urology;  Laterality: Right;  . HARDWARE REMOVAL  10/05/2012   Procedure: HARDWARE REMOVAL;  Surgeon: [name], MD;  Location: [facility];  Service: Orthopedics;  Laterality: Right;  REMOVING  STRYKER  GAMMA NAIL  . HARDWARE REMOVAL Right 07/03/2013   Procedure: HARDWARE REMOVAL RIGHT TIBIA ;  Surgeon: Rozanna Box, MD;  Location: [facility];  Service: Orthopedics;  Laterality: Right;  . HIP CLOSED REDUCTION Right 10/15/2013   Procedure: CLOSED MANIPULATION HIP;  Surgeon: [name], MD;  Location: [facility];  Service: Orthopedics;  Laterality: Right;  . hip revision     June 2017  . HOLMIUM LASER APPLICATION Right 0/11/7251   Procedure: HOLMIUM LASER APPLICATION;  Surgeon: Cleon Gustin, MD;  [facility]: [facility];  Service: Urology;  Laterality: Right;  . INCISION AND DRAINAGE HIP Right 11/16/2013   Procedure: IRRIGATION AND DEBRIDEMENT RIGHT HIP;  Surgeon: [name], MD;  Location: [facility];  Service: Orthopedics;  Laterality: Right;  . JOINT REPLACEMENT     hip-right x2  . KNEE SURGERY Bilateral 2012   Total knee replacements  . NEPHROLITHOTOMY Right 02/08/2017   Procedure: NEPHROLITHOTOMY PERCUTANEOUS WITH SURGEON ACCESS;  Surgeon: Cleon Gustin, MD;  [facility];  Service: Urology;  Laterality: Right;  . ORIF TIBIA FRACTURE Right 02/06/2013   Procedure: OPEN REDUCTION INTERNAL FIXATION (ORIF) TIBIA FRACTURE WITH IM ROD FIBULA;  Surgeon: Rozanna Box, MD;  Location: [facility] [place];  Service: Orthopedics;  Laterality: Right;  . ORIF TIBIA FRACTURE Right 07/03/2013   Procedure: RIGHT TIBIA  NON UNION REPAIR ;  Surgeon: Rozanna Box, MD;  Location: [place];  Service: Orthopedics;  Laterality: Right;  . ORIF WRIST FRACTURE  10/02/2012   Procedure: OPEN REDUCTION INTERNAL FIXATION (ORIF) WRIST FRACTURE;  Surgeon: Roseanne Kaufman, MD;  [facility];  Service: Orthopedics;  Laterality: Right;  WITH   ANTIBIOTIC  CEMENT  . ORIF WRIST FRACTURE Left 10/28/2013   Procedure: OPEN REDUCTION INTERNAL FIXATION (ORIF) WRIST FRACTURE with allograft;  Surgeon: Roseanne Kaufman, MD;  [facility];  Service: Orthopedics;  Laterality: Left;  DVR Plate  . right femur surgery  05/2012  . TOTAL HIP REVISION  10/05/2012   Procedure: TOTAL HIP REVISION;  Surgeon: [name], MD;  [facility];  Service: Orthopedics;  Laterality: Right;  RIGHT TOTAL HIP REVISION  . TOTAL HIP REVISION Right 09/17/2013   Procedure: REVISION RIGHT TOTAL HIP ARTHROPLASTY ;  Surgeon: [name], MD;  [facility];  Service: Orthopedics;  Laterality: Right;  . TOTAL HIP REVISION Right 10/26/2013   Procedure: REVISION RIGHT TOTAL HIP ARTHROPLASTY;  Surgeon: [name], MD;  [facility];  Service: Orthopedics;  Laterality: Right;  . TOTAL HIP REVISION  03/2016  . WRIST FRACTURE SURGERY  05/2012    No prescriptions prior to admission.   No Known Allergies  Social History  Substance Use Topics  . Smoking status: Former Smoker    Quit date: 06/05/2012  . Smokeless tobacco: Never Used  . Alcohol use Yes     Comment: occasional-social    Family History  Problem Relation Age of Onset  . CAD Father 38  . Arthritis Mother       Review of Systems  Constitutional: Positive for diaphoresis and malaise/fatigue.  HENT:       Sinus problems  Eyes:       Cataracts  Respiratory: Positive for shortness of breath.   Cardiovascular: Positive for leg swelling.       Afib  Gastrointestinal: Positive for constipation and vomiting.  Genitourinary: Positive for frequency and hematuria.       Kidney stones,  ED  Neurological: Positive for focal weakness.       Poor balance  Endo/Heme/Allergies: Bruises/bleeds easily.  Psychiatric/Behavioral: Positive for depression. The patient is nervous/anxious.      Objective:  Physical Exam  Constitutional: He is oriented to person, place, and time. He appears well-developed and well-nourished.  HENT:  Head: Normocephalic and atraumatic.  Eyes: Pupils are equal, round, and reactive to light.  Neck: Normal range of motion. Neck supple.  Cardiovascular: Intact distal pulses.   Respiratory: Effort normal.  Musculoskeletal: He exhibits tenderness.  The surgical scar the left knee is well-healed, 2-3+ effusion, collateral ligaments are stable.  The knee does come to full extension and flexes about 90.  Quadriceps powers intact, but it is painful.   He is neurovascularly intact distally.  Neurological: He is alert and oriented to person, place, and time.  Skin: Skin is warm and dry.  Psychiatric: He has a normal mood and affect. His behavior is normal. Judgment and thought content normal.    Vital signs in last 24 hours: Pulse Rate:  [83] 83 (06/29 1305) Resp:  [20] 20 (06/29 1305) BP: (116)/(59) 116/59 (06/29 1305) SpO2:  [98 %] 98 % (06/29 1305) Weight:  [131.2 kg (289 lb 4.8 oz)] 131.2 kg (289 lb 4.8 oz) (06/29 1305)  Labs:  Estimated body mass index is 35.21 kg/m as calculated from the following:   Height as of 04/08/17: 6\' 4"  (1.93 m).   Weight as of 04/08/17: 131.2 kg (289 lb 4.8 oz).  Imaging Review Plain radiographs demonstrate AP, lateral and sunrise views, shows patella subluxation with what appears to be that the patella component has come dislodged.  The femoral component and tibial component appear in satisfactory position.      Assessment/Plan:  End stage arthritis, left knee(s) with failed previous arthroplasty.   The patient history, physical examination, clinical judgment of the provider and imaging studies are consistent with  end stage degenerative joint disease of the left knee(s), previous total knee arthroplasty. Revision total knee arthroplasty is deemed medically necessary. The treatment options including medical management, injection therapy, arthroscopy and revision arthroplasty were discussed at length. The risks and benefits of revision total knee arthroplasty were presented and reviewed. The risks due to aseptic loosening, infection, stiffness, patella tracking problems, thromboembolic complications and other imponderables were discussed. The patient acknowledged the explanation, agreed to proceed with the plan and consent was signed. Patient is being admitted for inpatient treatment for surgery, pain control, PT, OT, prophylactic antibiotics, VTE prophylaxis, progressive ambulation and ADL's and discharge planning.The patient is planning to be discharged home with home health services.

## 2017-04-10 MED ORDER — TRANEXAMIC ACID 1000 MG/10ML IV SOLN
1000.0000 mg | INTRAVENOUS | Status: DC
  Filled 2017-04-10: qty 10

## 2017-04-10 MED ORDER — BUPIVACAINE LIPOSOME 1.3 % IJ SUSP
20.0000 mL | Freq: Once | INTRAMUSCULAR | Status: DC
  Filled 2017-04-10: qty 20

## 2017-04-10 MED ORDER — TRANEXAMIC ACID 1000 MG/10ML IV SOLN
2000.0000 mg | INTRAVENOUS | Status: DC
  Filled 2017-04-10: qty 20

## 2017-04-10 MED ORDER — TRANEXAMIC ACID 1000 MG/10ML IV SOLN
1000.0000 mg | INTRAVENOUS | Status: AC
  Administered 2017-04-11: 1000 mg via INTRAVENOUS
  Filled 2017-04-10: qty 10

## 2017-04-11 ENCOUNTER — Encounter (HOSPITAL_COMMUNITY)
Admission: RE | Disposition: A | Discharge status: Home / Self Care | Payer: Self-pay | Source: Ambulatory Visit | Attending: Orthopedic Surgery

## 2017-04-11 ENCOUNTER — Inpatient Hospital Stay (HOSPITAL_COMMUNITY): Payer: Medicare Other

## 2017-04-11 ENCOUNTER — Inpatient Hospital Stay (HOSPITAL_COMMUNITY): Payer: Medicare Other | Admitting: Emergency Medicine

## 2017-04-11 ENCOUNTER — Inpatient Hospital Stay (HOSPITAL_COMMUNITY)
Admission: RE | Admit: 2017-04-11 | Discharge: 2017-04-13 | DRG: 468 | Disposition: A | Discharge status: Home / Self Care | Payer: Medicare Other | Source: Ambulatory Visit | Attending: Orthopedic Surgery | Admitting: Orthopedic Surgery

## 2017-04-11 ENCOUNTER — Encounter (HOSPITAL_COMMUNITY): Payer: Self-pay

## 2017-04-11 DIAGNOSIS — Z7901 Long term (current) use of anticoagulants: Secondary | ICD-10-CM | POA: Diagnosis not present

## 2017-04-11 DIAGNOSIS — I10 Essential (primary) hypertension: Secondary | ICD-10-CM | POA: Diagnosis present

## 2017-04-11 DIAGNOSIS — Z8261 Family history of arthritis: Secondary | ICD-10-CM | POA: Diagnosis not present

## 2017-04-11 DIAGNOSIS — K219 Gastro-esophageal reflux disease without esophagitis: Secondary | ICD-10-CM | POA: Diagnosis present

## 2017-04-11 DIAGNOSIS — Z96651 Presence of right artificial knee joint: Secondary | ICD-10-CM | POA: Diagnosis present

## 2017-04-11 DIAGNOSIS — T84033A Mechanical loosening of internal left knee prosthetic joint, initial encounter: Secondary | ICD-10-CM | POA: Diagnosis present

## 2017-04-11 DIAGNOSIS — I480 Paroxysmal atrial fibrillation: Secondary | ICD-10-CM | POA: Diagnosis present

## 2017-04-11 DIAGNOSIS — Y792 Prosthetic and other implants, materials and accessory orthopedic devices associated with adverse incidents: Secondary | ICD-10-CM | POA: Diagnosis present

## 2017-04-11 DIAGNOSIS — H269 Unspecified cataract: Secondary | ICD-10-CM | POA: Diagnosis present

## 2017-04-11 DIAGNOSIS — M1712 Unilateral primary osteoarthritis, left knee: Secondary | ICD-10-CM | POA: Diagnosis present

## 2017-04-11 DIAGNOSIS — T84093A Other mechanical complication of internal left knee prosthesis, initial encounter: Secondary | ICD-10-CM

## 2017-04-11 DIAGNOSIS — Z87440 Personal history of urinary (tract) infections: Secondary | ICD-10-CM

## 2017-04-11 DIAGNOSIS — Z9181 History of falling: Secondary | ICD-10-CM

## 2017-04-11 DIAGNOSIS — E890 Postprocedural hypothyroidism: Secondary | ICD-10-CM | POA: Diagnosis present

## 2017-04-11 DIAGNOSIS — Z96659 Presence of unspecified artificial knee joint: Secondary | ICD-10-CM

## 2017-04-11 DIAGNOSIS — Z96641 Presence of right artificial hip joint: Secondary | ICD-10-CM | POA: Diagnosis present

## 2017-04-11 DIAGNOSIS — Z87442 Personal history of urinary calculi: Secondary | ICD-10-CM

## 2017-04-11 DIAGNOSIS — Z8249 Family history of ischemic heart disease and other diseases of the circulatory system: Secondary | ICD-10-CM

## 2017-04-11 DIAGNOSIS — Z87891 Personal history of nicotine dependence: Secondary | ICD-10-CM

## 2017-04-11 DIAGNOSIS — M810 Age-related osteoporosis without current pathological fracture: Secondary | ICD-10-CM | POA: Diagnosis present

## 2017-04-11 HISTORY — PX: TOTAL KNEE REVISION: SHX996

## 2017-04-11 LAB — PROTIME-INR
INR: 1.18
PROTHROMBIN TIME: 15.1 s (ref 11.4–15.2)

## 2017-04-11 LAB — APTT: aPTT: 37 seconds — ABNORMAL HIGH (ref 24–36)

## 2017-04-11 SURGERY — TOTAL KNEE REVISION
Anesthesia: Monitor Anesthesia Care | Site: Knee | Laterality: Left

## 2017-04-11 MED ORDER — ACETAMINOPHEN 325 MG PO TABS
650.0000 mg | ORAL_TABLET | Freq: Four times a day (QID) | ORAL | Status: DC | PRN
  Administered 2017-04-11 – 2017-04-12 (×2): 650 mg via ORAL
  Filled 2017-04-11 (×2): qty 2

## 2017-04-11 MED ORDER — BUPIVACAINE LIPOSOME 1.3 % IJ SUSP
INTRAMUSCULAR | Status: DC | PRN
  Administered 2017-04-11: 20 mL

## 2017-04-11 MED ORDER — HYDROMORPHONE HCL 1 MG/ML IJ SOLN
0.2500 mg | INTRAMUSCULAR | Status: DC | PRN
  Administered 2017-04-11: 0.5 mg via INTRAVENOUS

## 2017-04-11 MED ORDER — 0.9 % SODIUM CHLORIDE (POUR BTL) OPTIME
TOPICAL | Status: DC | PRN
  Administered 2017-04-11: 1000 mL

## 2017-04-11 MED ORDER — PANTOPRAZOLE SODIUM 40 MG PO TBEC
80.0000 mg | DELAYED_RELEASE_TABLET | Freq: Every day | ORAL | Status: DC
  Administered 2017-04-12 – 2017-04-13 (×2): 80 mg via ORAL
  Filled 2017-04-11 (×2): qty 2

## 2017-04-11 MED ORDER — HYDROMORPHONE HCL 1 MG/ML IJ SOLN
0.5000 mg | INTRAMUSCULAR | Status: DC | PRN
  Administered 2017-04-11 – 2017-04-12 (×2): 0.5 mg via INTRAVENOUS
  Filled 2017-04-11: qty 1
  Filled 2017-04-11: qty 0.5

## 2017-04-11 MED ORDER — FENTANYL CITRATE (PF) 100 MCG/2ML IJ SOLN
INTRAMUSCULAR | Status: DC | PRN
  Administered 2017-04-11 (×2): 50 ug via INTRAVENOUS

## 2017-04-11 MED ORDER — TIZANIDINE HCL 4 MG PO TABS: 4.0000 mg | ORAL_TABLET | Freq: Four times a day (QID) | ORAL | 0 refills | Status: DC | PRN

## 2017-04-11 MED ORDER — TRANEXAMIC ACID 1000 MG/10ML IV SOLN
INTRAVENOUS | Status: AC | PRN
  Administered 2017-04-11: 2000 mg via TOPICAL

## 2017-04-11 MED ORDER — DEXTROSE-NACL 5-0.45 % IV SOLN: INTRAVENOUS | Status: DC

## 2017-04-11 MED ORDER — DOCUSATE SODIUM 100 MG PO CAPS
100.0000 mg | ORAL_CAPSULE | Freq: Two times a day (BID) | ORAL | Status: DC
  Administered 2017-04-11 – 2017-04-13 (×4): 100 mg via ORAL
  Filled 2017-04-11 (×4): qty 1

## 2017-04-11 MED ORDER — PROPOFOL 10 MG/ML IV BOLUS
INTRAVENOUS | Status: AC
  Filled 2017-04-11: qty 20

## 2017-04-11 MED ORDER — ALBUMIN HUMAN 5 % IV SOLN
INTRAVENOUS | Status: DC | PRN
  Administered 2017-04-11: 13:00:00 via INTRAVENOUS

## 2017-04-11 MED ORDER — DIPHENHYDRAMINE HCL 12.5 MG/5ML PO ELIX: 12.5000 mg | ORAL_SOLUTION | ORAL | Status: DC | PRN

## 2017-04-11 MED ORDER — ONDANSETRON HCL 4 MG/2ML IJ SOLN
INTRAMUSCULAR | Status: DC | PRN
  Administered 2017-04-11: 4 mg via INTRAVENOUS

## 2017-04-11 MED ORDER — ALFUZOSIN HCL ER 10 MG PO TB24
10.0000 mg | ORAL_TABLET | Freq: Every day | ORAL | Status: DC
  Administered 2017-04-11 – 2017-04-12 (×2): 10 mg via ORAL
  Filled 2017-04-11 (×2): qty 1

## 2017-04-11 MED ORDER — AMIODARONE HCL 100 MG PO TABS
200.0000 mg | ORAL_TABLET | Freq: Every day | ORAL | Status: DC
  Administered 2017-04-12 – 2017-04-13 (×2): 200 mg via ORAL
  Filled 2017-04-11 (×2): qty 2

## 2017-04-11 MED ORDER — SENNOSIDES-DOCUSATE SODIUM 8.6-50 MG PO TABS: 1.0000 | ORAL_TABLET | Freq: Every evening | ORAL | Status: DC | PRN

## 2017-04-11 MED ORDER — CEFAZOLIN SODIUM-DEXTROSE 2-4 GM/100ML-% IV SOLN: 2.0000 g | INTRAVENOUS | Status: DC

## 2017-04-11 MED ORDER — BUPROPION HCL ER (XL) 150 MG PO TB24
300.0000 mg | ORAL_TABLET | Freq: Every day | ORAL | Status: DC
  Administered 2017-04-13: 300 mg via ORAL
  Filled 2017-04-11: qty 2

## 2017-04-11 MED ORDER — FENTANYL CITRATE (PF) 250 MCG/5ML IJ SOLN
INTRAMUSCULAR | Status: AC
  Filled 2017-04-11: qty 5

## 2017-04-11 MED ORDER — KCL IN DEXTROSE-NACL 20-5-0.45 MEQ/L-%-% IV SOLN
INTRAVENOUS | Status: DC
  Filled 2017-04-11: qty 1000

## 2017-04-11 MED ORDER — MIDAZOLAM HCL 2 MG/2ML IJ SOLN
INTRAMUSCULAR | Status: AC
  Filled 2017-04-11: qty 2

## 2017-04-11 MED ORDER — ONDANSETRON HCL 4 MG PO TABS: 4.0000 mg | ORAL_TABLET | Freq: Four times a day (QID) | ORAL | Status: DC | PRN

## 2017-04-11 MED ORDER — METHOCARBAMOL 500 MG PO TABS
ORAL_TABLET | ORAL | Status: AC
  Filled 2017-04-11: qty 1

## 2017-04-11 MED ORDER — DULOXETINE HCL 60 MG PO CPEP
60.0000 mg | ORAL_CAPSULE | Freq: Two times a day (BID) | ORAL | Status: DC
  Administered 2017-04-11 – 2017-04-13 (×4): 60 mg via ORAL
  Filled 2017-04-11 (×4): qty 1

## 2017-04-11 MED ORDER — ALUM & MAG HYDROXIDE-SIMETH 200-200-20 MG/5ML PO SUSP: 30.0000 mL | ORAL | Status: DC | PRN

## 2017-04-11 MED ORDER — NITROFURANTOIN MACROCRYSTAL 100 MG PO CAPS
100.0000 mg | ORAL_CAPSULE | Freq: Two times a day (BID) | ORAL | Status: DC
  Administered 2017-04-11 – 2017-04-13 (×4): 100 mg via ORAL
  Filled 2017-04-11 (×4): qty 1

## 2017-04-11 MED ORDER — OXYCODONE HCL 5 MG PO TABS
5.0000 mg | ORAL_TABLET | ORAL | Status: DC | PRN
  Administered 2017-04-11 – 2017-04-13 (×6): 10 mg via ORAL
  Filled 2017-04-11 (×5): qty 2

## 2017-04-11 MED ORDER — METOCLOPRAMIDE HCL 5 MG PO TABS: 5.0000 mg | ORAL_TABLET | Freq: Three times a day (TID) | ORAL | Status: DC | PRN

## 2017-04-11 MED ORDER — BUPIVACAINE-EPINEPHRINE (PF) 0.5% -1:200000 IJ SOLN
INTRAMUSCULAR | Status: DC | PRN
  Administered 2017-04-11: 30 mL via PERINEURAL

## 2017-04-11 MED ORDER — ACETAMINOPHEN 10 MG/ML IV SOLN: 1000.0000 mg | Freq: Once | INTRAVENOUS | Status: DC | PRN

## 2017-04-11 MED ORDER — BUPIVACAINE-EPINEPHRINE (PF) 0.5% -1:200000 IJ SOLN
INTRAMUSCULAR | Status: AC
  Filled 2017-04-11: qty 60

## 2017-04-11 MED ORDER — LEVOTHYROXINE SODIUM 100 MCG PO TABS: 200.0000 ug | ORAL_TABLET | Freq: Every day | ORAL | Status: DC

## 2017-04-11 MED ORDER — CLONAZEPAM 1 MG PO TABS
1.0000 mg | ORAL_TABLET | Freq: Four times a day (QID) | ORAL | Status: DC | PRN
  Administered 2017-04-11 – 2017-04-13 (×5): 1 mg via ORAL
  Filled 2017-04-11 (×5): qty 1

## 2017-04-11 MED ORDER — GABAPENTIN 300 MG PO CAPS
300.0000 mg | ORAL_CAPSULE | Freq: Three times a day (TID) | ORAL | Status: DC
  Administered 2017-04-11 – 2017-04-13 (×5): 300 mg via ORAL
  Filled 2017-04-11 (×5): qty 1

## 2017-04-11 MED ORDER — MEPERIDINE HCL 25 MG/ML IJ SOLN: 6.2500 mg | INTRAMUSCULAR | Status: DC | PRN

## 2017-04-11 MED ORDER — RIVAROXABAN 10 MG PO TABS
10.0000 mg | ORAL_TABLET | Freq: Every day | ORAL | Status: DC
  Administered 2017-04-12 – 2017-04-13 (×2): 10 mg via ORAL
  Filled 2017-04-11 (×2): qty 1

## 2017-04-11 MED ORDER — ONDANSETRON HCL 4 MG/2ML IJ SOLN: 4.0000 mg | Freq: Four times a day (QID) | INTRAMUSCULAR | Status: DC | PRN

## 2017-04-11 MED ORDER — DEXTROSE 5 % IV SOLN
500.0000 mg | Freq: Four times a day (QID) | INTRAVENOUS | Status: DC | PRN
  Filled 2017-04-11: qty 5

## 2017-04-11 MED ORDER — OXYCODONE HCL 5 MG PO TABS
ORAL_TABLET | ORAL | Status: AC
  Filled 2017-04-11: qty 2

## 2017-04-11 MED ORDER — HYDROMORPHONE HCL 1 MG/ML IJ SOLN
INTRAMUSCULAR | Status: AC
  Filled 2017-04-11: qty 0.5

## 2017-04-11 MED ORDER — DOXYCYCLINE HYCLATE 100 MG PO TABS
100.0000 mg | ORAL_TABLET | Freq: Every day | ORAL | Status: DC
  Administered 2017-04-12 – 2017-04-13 (×2): 100 mg via ORAL
  Filled 2017-04-11 (×2): qty 1

## 2017-04-11 MED ORDER — PROMETHAZINE HCL 25 MG/ML IJ SOLN: 6.2500 mg | INTRAMUSCULAR | Status: DC | PRN

## 2017-04-11 MED ORDER — OXYCODONE-ACETAMINOPHEN 5-325 MG PO TABS: 1.0000 | ORAL_TABLET | ORAL | 0 refills | Status: DC | PRN

## 2017-04-11 MED ORDER — CEFAZOLIN SODIUM 10 G IJ SOLR
3.0000 g | INTRAMUSCULAR | Status: AC
  Administered 2017-04-11: 3 g via INTRAVENOUS
  Filled 2017-04-11: qty 3000

## 2017-04-11 MED ORDER — FLEET ENEMA 7-19 GM/118ML RE ENEM: 1.0000 | ENEMA | Freq: Once | RECTAL | Status: DC | PRN

## 2017-04-11 MED ORDER — CHLORHEXIDINE GLUCONATE 4 % EX LIQD: 60.0000 mL | Freq: Once | CUTANEOUS | Status: DC

## 2017-04-11 MED ORDER — PROPOFOL 10 MG/ML IV BOLUS
INTRAVENOUS | Status: DC | PRN
  Administered 2017-04-11: 10 mg via INTRAVENOUS

## 2017-04-11 MED ORDER — CEFUROXIME SODIUM 1.5 G IV SOLR
INTRAVENOUS | Status: AC
  Filled 2017-04-11: qty 1.5

## 2017-04-11 MED ORDER — PHENOL 1.4 % MT LIQD: 1.0000 | OROMUCOSAL | Status: DC | PRN

## 2017-04-11 MED ORDER — LEVOTHYROXINE SODIUM 25 MCG PO TABS: 25.0000 ug | ORAL_TABLET | Freq: Every day | ORAL | Status: DC

## 2017-04-11 MED ORDER — BUPIVACAINE IN DEXTROSE 0.75-8.25 % IT SOLN
INTRATHECAL | Status: DC | PRN
  Administered 2017-04-11: 2 mL via INTRATHECAL

## 2017-04-11 MED ORDER — METOCLOPRAMIDE HCL 5 MG/ML IJ SOLN: 5.0000 mg | Freq: Three times a day (TID) | INTRAMUSCULAR | Status: DC | PRN

## 2017-04-11 MED ORDER — NITROGLYCERIN 0.4 MG SL SUBL: 0.4000 mg | SUBLINGUAL_TABLET | SUBLINGUAL | Status: DC | PRN

## 2017-04-11 MED ORDER — CELECOXIB 200 MG PO CAPS
200.0000 mg | ORAL_CAPSULE | Freq: Two times a day (BID) | ORAL | Status: DC
  Administered 2017-04-11 – 2017-04-13 (×4): 200 mg via ORAL
  Filled 2017-04-11 (×4): qty 1

## 2017-04-11 MED ORDER — RANOLAZINE ER 500 MG PO TB12
500.0000 mg | ORAL_TABLET | Freq: Two times a day (BID) | ORAL | Status: DC
  Administered 2017-04-11 – 2017-04-13 (×4): 500 mg via ORAL
  Filled 2017-04-11 (×4): qty 1

## 2017-04-11 MED ORDER — ACETAMINOPHEN 650 MG RE SUPP: 650.0000 mg | Freq: Four times a day (QID) | RECTAL | Status: DC | PRN

## 2017-04-11 MED ORDER — LORATADINE 10 MG PO TABS
10.0000 mg | ORAL_TABLET | Freq: Every day | ORAL | Status: DC
  Administered 2017-04-12 – 2017-04-13 (×2): 10 mg via ORAL
  Filled 2017-04-11 (×2): qty 1

## 2017-04-11 MED ORDER — LACTATED RINGERS IV SOLN
INTRAVENOUS | Status: DC
  Administered 2017-04-11 (×2): via INTRAVENOUS

## 2017-04-11 MED ORDER — ONDANSETRON HCL 4 MG/2ML IJ SOLN
INTRAMUSCULAR | Status: AC
  Filled 2017-04-11: qty 2

## 2017-04-11 MED ORDER — METHOCARBAMOL 500 MG PO TABS
500.0000 mg | ORAL_TABLET | Freq: Four times a day (QID) | ORAL | Status: DC | PRN
  Administered 2017-04-11 – 2017-04-13 (×4): 500 mg via ORAL
  Filled 2017-04-11 (×3): qty 1

## 2017-04-11 MED ORDER — RIVAROXABAN 20 MG PO TABS: 20.0000 mg | ORAL_TABLET | Freq: Every day | ORAL | 0 refills | Status: DC

## 2017-04-11 MED ORDER — BISACODYL 5 MG PO TBEC: 5.0000 mg | DELAYED_RELEASE_TABLET | Freq: Every day | ORAL | Status: DC | PRN

## 2017-04-11 MED ORDER — LEVOTHYROXINE SODIUM 75 MCG PO TABS
225.0000 ug | ORAL_TABLET | Freq: Every day | ORAL | Status: DC
  Administered 2017-04-12 – 2017-04-13 (×2): 225 ug via ORAL
  Filled 2017-04-11 (×3): qty 3

## 2017-04-11 MED ORDER — BUPIVACAINE-EPINEPHRINE 0.5% -1:200000 IJ SOLN
INTRAMUSCULAR | Status: DC | PRN
  Administered 2017-04-11: 50 mL

## 2017-04-11 MED ORDER — DEXTROSE 5 % IV SOLN
3.0000 g | Freq: Four times a day (QID) | INTRAVENOUS | Status: AC
  Administered 2017-04-11 (×2): 3 g via INTRAVENOUS
  Filled 2017-04-11 (×3): qty 3000

## 2017-04-11 MED ORDER — FENTANYL CITRATE (PF) 100 MCG/2ML IJ SOLN
INTRAMUSCULAR | Status: AC
  Administered 2017-04-11: 50 ug
  Filled 2017-04-11: qty 2

## 2017-04-11 MED ORDER — DILTIAZEM HCL ER COATED BEADS 240 MG PO CP24
240.0000 mg | ORAL_CAPSULE | Freq: Every day | ORAL | Status: DC
  Administered 2017-04-12 – 2017-04-13 (×2): 240 mg via ORAL
  Filled 2017-04-11 (×2): qty 1

## 2017-04-11 MED ORDER — MENTHOL 3 MG MT LOZG: 1.0000 | LOZENGE | OROMUCOSAL | Status: DC | PRN

## 2017-04-11 SURGICAL SUPPLY — 83 items
BAG DECANTER FOR FLEXI CONT (MISCELLANEOUS) ×1 IMPLANT
BANDAGE ACE 4X5 VEL STRL LF (GAUZE/BANDAGES/DRESSINGS) ×2 IMPLANT
BANDAGE ACE 6X5 VEL STRL LF (GAUZE/BANDAGES/DRESSINGS) ×2 IMPLANT
BANDAGE ESMARK 6X9 LF (GAUZE/BANDAGES/DRESSINGS) ×1 IMPLANT
BLADE CLIPPER SURG (BLADE) IMPLANT
BLADE SAG 18X100X1.27 (BLADE) ×1 IMPLANT
BLADE SAW SAG 90X13X1.27 (BLADE) ×1 IMPLANT
BLADE SURG 10 STRL SS (BLADE) ×3 IMPLANT
BNDG CMPR 9X6 STRL LF SNTH (GAUZE/BANDAGES/DRESSINGS) ×1
BNDG CMPR MED 15X6 ELC VLCR LF (GAUZE/BANDAGES/DRESSINGS) ×1
BNDG ELASTIC 6X15 VLCR STRL LF (GAUZE/BANDAGES/DRESSINGS) ×1 IMPLANT
BNDG ESMARK 6X9 LF (GAUZE/BANDAGES/DRESSINGS) ×2
BOWL SMART MIX CTS (DISPOSABLE) ×1 IMPLANT
CEMENT HV SMART SET (Cement) ×2 IMPLANT
COVER BACK TABLE 24X17X13 BIG (DRAPES) IMPLANT
COVER BACK TABLE 80X110 HD (DRAPES) ×1 IMPLANT
COVER SURGICAL LIGHT HANDLE (MISCELLANEOUS) ×2 IMPLANT
CUFF TOURNIQUET SINGLE 34IN LL (TOURNIQUET CUFF) ×1 IMPLANT
CUFF TOURNIQUET SINGLE 44IN (TOURNIQUET CUFF) ×1 IMPLANT
DISC DIAMOND MED (BURR) IMPLANT
DRAPE EXTREMITY T 121X128X90 (DRAPE) ×1 IMPLANT
DRAPE HALF SHEET 40X57 (DRAPES) ×2 IMPLANT
DRAPE IMP U-DRAPE 54X76 (DRAPES) ×2 IMPLANT
DRAPE U-SHAPE 47X51 STRL (DRAPES) ×2 IMPLANT
DRSG AQUACEL AG ADV 3.5X14 (GAUZE/BANDAGES/DRESSINGS) ×1 IMPLANT
DURAPREP 26ML APPLICATOR (WOUND CARE) ×2 IMPLANT
ELECT BLADE 4.0 EZ CLEAN MEGAD (MISCELLANEOUS) ×2
ELECT CAUTERY BLADE 6.4 (BLADE) ×1 IMPLANT
ELECT REM PT RETURN 9FT ADLT (ELECTROSURGICAL) ×2
ELECTRODE BLDE 4.0 EZ CLN MEGD (MISCELLANEOUS) IMPLANT
ELECTRODE REM PT RTRN 9FT ADLT (ELECTROSURGICAL) ×1 IMPLANT
EVACUATOR 1/8 PVC DRAIN (DRAIN) IMPLANT
GAUZE SPONGE 4X4 12PLY STRL (GAUZE/BANDAGES/DRESSINGS) ×4 IMPLANT
GAUZE XEROFORM 1X8 LF (GAUZE/BANDAGES/DRESSINGS) ×2 IMPLANT
GLOVE BIO SURGEON STRL SZ7.5 (GLOVE) ×2 IMPLANT
GLOVE BIO SURGEON STRL SZ8.5 (GLOVE) ×4 IMPLANT
GLOVE BIOGEL PI IND STRL 8 (GLOVE) ×2 IMPLANT
GLOVE BIOGEL PI IND STRL 9 (GLOVE) ×1 IMPLANT
GLOVE BIOGEL PI INDICATOR 8 (GLOVE) ×2
GLOVE BIOGEL PI INDICATOR 9 (GLOVE) ×1
GOWN STRL REUS W/ TWL LRG LVL3 (GOWN DISPOSABLE) ×1 IMPLANT
GOWN STRL REUS W/ TWL XL LVL3 (GOWN DISPOSABLE) ×3 IMPLANT
GOWN STRL REUS W/TWL LRG LVL3 (GOWN DISPOSABLE) ×2
GOWN STRL REUS W/TWL XL LVL3 (GOWN DISPOSABLE) ×6
HANDPIECE INTERPULSE COAX TIP (DISPOSABLE)
HOOD PEEL AWAY FACE SHEILD DIS (HOOD) ×7 IMPLANT
KIT BASIN OR (CUSTOM PROCEDURE TRAY) ×2 IMPLANT
KIT ROOM TURNOVER OR (KITS) ×2 IMPLANT
MANIFOLD NEPTUNE II (INSTRUMENTS) ×2 IMPLANT
NDL SPNL 18GX3.5 QUINCKE PK (NEEDLE) ×1 IMPLANT
NEEDLE 22X1 1/2 (OR ONLY) (NEEDLE) ×2 IMPLANT
NEEDLE SPNL 18GX3.5 QUINCKE PK (NEEDLE) ×2 IMPLANT
NS IRRIG 1000ML POUR BTL (IV SOLUTION) ×2 IMPLANT
PACK TOTAL JOINT (CUSTOM PROCEDURE TRAY) ×2 IMPLANT
PACK UNIVERSAL I (CUSTOM PROCEDURE TRAY) ×2 IMPLANT
PAD ARMBOARD 7.5X6 YLW CONV (MISCELLANEOUS) ×4 IMPLANT
PAD CAST 4YDX4 CTTN HI CHSV (CAST SUPPLIES) ×1 IMPLANT
PADDING CAST COTTON 4X4 STRL (CAST SUPPLIES) ×2
PADDING CAST COTTON 6X4 STRL (CAST SUPPLIES) ×2 IMPLANT
PATELLA DOME PFC 41MM (Knees) ×1 IMPLANT
PLATE ROT INSERT 17.5MM SIZE5 (Plate) ×1 IMPLANT
RASP HELIOCORDIAL MED (MISCELLANEOUS) IMPLANT
SET HNDPC FAN SPRY TIP SCT (DISPOSABLE) ×1 IMPLANT
SPONGE LAP 18X18 X RAY DECT (DISPOSABLE) ×5 IMPLANT
STAPLER VISISTAT 35W (STAPLE) ×1 IMPLANT
STEM UNIVERSAL FLUTED 150X14MM (Shell) ×1 IMPLANT
SUCTION FRAZIER HANDLE 10FR (MISCELLANEOUS)
SUCTION TUBE FRAZIER 10FR DISP (MISCELLANEOUS) ×1 IMPLANT
SUT VIC AB 0 CT1 27 (SUTURE) ×2
SUT VIC AB 0 CT1 27XBRD ANBCTR (SUTURE) ×1 IMPLANT
SUT VIC AB 1 CTX 36 (SUTURE) ×6
SUT VIC AB 1 CTX36XBRD ANBCTR (SUTURE) ×1 IMPLANT
SUT VIC AB 2-0 CT1 27 (SUTURE) ×2
SUT VIC AB 2-0 CT1 TAPERPNT 27 (SUTURE) ×1 IMPLANT
SWAB COLLECTION DEVICE MRSA (MISCELLANEOUS) ×1 IMPLANT
SWAB CULTURE ESWAB REG 1ML (MISCELLANEOUS) ×1 IMPLANT
SYR 50ML LL SCALE MARK (SYRINGE) ×2 IMPLANT
SYR CONTROL 10ML LL (SYRINGE) ×2 IMPLANT
TOWEL OR 17X24 6PK STRL BLUE (TOWEL DISPOSABLE) ×2 IMPLANT
TOWEL OR 17X26 10 PK STRL BLUE (TOWEL DISPOSABLE) ×2 IMPLANT
TRAY FOLEY CATH SILVER 14FR (SET/KITS/TRAYS/PACK) IMPLANT
TRAY REVISION SZ 6 (Knees) ×1 IMPLANT
WATER STERILE IRR 1000ML POUR (IV SOLUTION) ×6 IMPLANT

## 2017-04-11 NOTE — Anesthesia Postprocedure Evaluation (Signed)
Anesthesia Post Note  Patient: [name]  Procedure(s) Performed: Procedure(s) (LRB): TOTAL KNEE REVISION PATELLA and TIBIA (Left)     Patient location during evaluation: PACU Anesthesia Type: Spinal Level of consciousness: awake Pain management: pain level controlled Vital Signs Assessment: post-procedure vital signs reviewed and stable Respiratory status: spontaneous breathing Cardiovascular status: stable Postop Assessment: no headache, no backache, spinal receding, patient able to bend at knees and no signs of nausea or vomiting Anesthetic complications: no    Last Vitals:  Vitals:   04/11/17 1430 04/11/17 1445  BP: (!) 109/58 114/60  Pulse: 69 76  Resp: 16 20  Temp:      Last Pain:  Vitals:   04/11/17 1445  TempSrc:   PainSc: 3    Pain Goal: Patients Stated Pain Goal: 4 (04/11/17 1425)               [place]

## 2017-04-11 NOTE — Progress Notes (Signed)
Orthopedic Tech Progress Note Patient Details:  [name] 08/14/1946 [id number]  Ortho Devices Type of Ortho Device:  (footsie roll) Ortho Device/Splint Location: lle Ortho Device/Splint Interventions: Application   [name] 04/11/2017, 3:54 PM

## 2017-04-11 NOTE — Transfer of Care (Signed)
  Immediate Anesthesia Transfer of Care Note  Patient: [name]  Procedure(s) Performed: Procedure(s): TOTAL KNEE REVISION PATELLA (Left)  Patient Location: PACU  Anesthesia Type:MAC, Regional and Spinal  Level of Consciousness: awake, alert , oriented and sedated  Airway & Oxygen Therapy: Patient Spontanous Breathing and Patient connected to nasal cannula oxygen  Post-op Assessment: Report given to RN, Post -op Vital signs reviewed and stable and Patient moving all extremities  Post vital signs: Reviewed and stable  Last Vitals:  Vitals:   04/11/17 0833 04/11/17 1405  BP: 124/60 (!) 111/56  Pulse: 73 70  Resp: 18 17  Temp: 36.9 C     Last Pain:  Vitals:   04/11/17 0833  TempSrc: Oral         Complications: No apparent anesthesia complications

## 2017-04-11 NOTE — Op Note (Signed)
PATIENT ID:      [name]  MRN:     [id number] DOB/AGE:    January 19, 1946 / 71 y.o.       OPERATIVE REPORT    DATE OF PROCEDURE:  04/11/2017       PREOPERATIVE DIAGNOSIS:   LOOSE LEFT TOTAL KNEE ARTHROPLASTY PATELLA      Estimated body mass index is 35.21 kg/m as calculated from the following:   Height as of this encounter: 6\' 4"  (1.93 m).   Weight as of this encounter: 131.2 kg (289 lb 4.8 oz).                                                        POSTOPERATIVE DIAGNOSIS:   LOOSE LEFT TOTAL KNEE ARTHROPLASTY PATELLA, loose #5 Sigma tibial component                                                                     PROCEDURE:  Procedure(s): TOTAL KNEE REVISION WITH REMOVAL OF GROSSLY LOOSE PATELLAR COMPONENT AND TIBIAL BASE PLATE AS WELL AS TIBIAL BEARING. WE REVISED FROM A 38 PATELLA TO A 41 PATELLA CEMENTED, ON THE TIBIAL SIDE WERE REMOVED A #5 SIGMA RP TIBIAL BASEPLATE AND PLACED A 6 MBT BASEPLATE WITH A 1 50 X 14 PROXIMALLY CEMENTED STEM WE PLACED A NEW 17.5 MM RP BEARING     SURGEON: [name]    ASSISTANT:   [name]   (Present and scrubbed throughout the case, critical for assistance with exposure, retraction, instrumentation, and closure.)         ANESTHESIA: Spinal, adductor canal block, Exparel  EBL: 1000 mL  FLUID REPLACEMENT: 1800 crystalloid TOURNIQUET TIME: 15 min  DRAINS: none  Tranexamic Acid: 1 g IV, 2 g topical  COMPLICATIONS:  None        INDICATIONS FOR PROCEDURE: Patient was seen in consultation from [name] for a grossly loose patella component that had fallen off and was loose inside of the joint. Careful evaluation of x-rays from January showed loosening and x-rays done by [name] in [place] showed that the component was off the back of the patella. He's had swelling and pain ever since. Based on the lateral and sunrise views it looks like there is no bone to cement a new component in place. He's not had any fevers or chills or wound  problems infection is unlikely. Risks and benefits of surgery have been discussed, questions answered.   DESCRIPTION OF PROCEDURE: The patient identified by armband, received  IV antibiotics, in the holding area at [name]. Patient taken to the operating room, appropriate anesthetic monitors were attached, and general endotracheal anesthesia induced with the patient in supine position. Tourniquet applied high to the operative thigh. Lateral post and foot positioner applied to the table, the lower extremity was then prepped and draped in usual sterile fashion from the ankle to the tourniquet. Time-out procedure was performed.  We began the operation with the knee flexed 120 degrees, by making an anterior midline incision starting at handbreadth above the patella  going over the patella 1 cm medial to and 4 cm distal to the tibial tubercle. We actually excised a quarter inch wide section of skin from the old incision as the patient did want a scar revision. Small bleeders in the skin and the subcutaneous tissue identified and cauterized. Transverse retinaculum was incised and reflected medially and a medial parapatellar arthrotomy was accomplished. We immediately encountered a large amount of reddish and purplish villous material consistent with reaction to polyethylene or cement particles. We peeled the medial collateral ligament insertion off of the proximal tibia over a 4 cm span going from anterior to posterior. The patella was everted and large amounts of the reactive tissue was excised and sent off for Gram stain and culture although the fluid itself was clear. We also encountered loose patella component up in the suprapatellar pouch which was removed without difficulty. We spent about 30 minutes removing reactive tissue from around the patella distal femur and proximal tibia lot of it was fatty material and once it all been removed we tested the stability of the femoral component which was excellent  the tibial component however was loose and was removed with a 2 inch wide osteotome without difficulty. We then removed the loose cement from the proximal tibia noted a posterior medial early deficiency and the bone. At this point we elected to put a long stemmed tibial implant with proximal cement in place. Using the DePuy long reamers we reamed up to a 14 mm size for a 150 stem and then sized for a #6 tibial baseplate. The baseplate was centered over the tibial reamer we then reamed with a proximal reamer with a 14 mm attachment by 150 mm. At this point the tibial baseplate tower and reamer were removed we assembled a trial with a #6 MBT tray, 14 mm x 1 50 mm stem applied this to the proximal tibia and applied the punch without difficulty. We then directed our attention of the patella and using the Sigma RP patellar cutting guide removed just enough bone to freshen up the back of the patella. We sized for 41 button, although a much larger button probably would've fit if available. The lollipop was then drilled a 17.5 mm trial RP bearing was placed with a 41 trial patellar button the knee was reduced taken the range of motion and noted to have excellent stability. At this point the [place] a sponges placed in the wound the limb was wrapped with an Esmarch bandage the tourniquet inflated to 3 and 50 mmHg with the knee flexed. We then pulse lavaged clean all the bony mating surfaces and removed any fibrous tissue from the proximal tibia with curettes and Rodgers. Small areas of sclerotic bone were then drilled with a guide pin drill to a depth of 1/8 inch. At the back table we assembled a #6 MBT baseplate with a 1 50 x 14 mm stem. A double batch of DePuy HV cement with 1500 mg was mixed and applied to all bony and proximal metallic mating surfaces the tibial component was hammered into place and excess cement removed the 41 mm patellar button was likewise pressed into place and excess cement removed. We then placed a 17.5  mm bearing reduce the knee and held it flexed at 45. After 10 minutes the tourniquet was let down small bleeders were identified and cauterized. X Prell was injected into the soft tissue in the paratracheal region as well as the tendon and subcutaneous tissue. The parapatellar arthrotomy was  closed with running #1 Vicryl suture the subcutaneous tissue 0 and 2-0 undyed Vicryl suture and a subcuticular closure 3-0 Vicryl was then accomplished. The patient was then taken to the recovery without difficulty.   . While the cement cured the wound was irrigated out with normal saline solution pulse lavage. Ligament stability and patellar tracking were checked and found to be excellent. The tourniquet was let down,  bleeders were identified and cauterized.The parapatellar arthrotomy was closed with running #1 Vicryl suture. The subcutaneous tissue with 0 and 2-0 undyed Vicryl suture, and the skin with3-0 vicryl.  A dressing of Xeroform, 4 x 4, dressing sponges, Webril, and Ace wrap applied. The patient awakened, extubated, and taken to recovery room without difficulty.   Joliet Mallozzi Lane 04/11/2017, 1:15 PM

## 2017-04-11 NOTE — Anesthesia Procedure Notes (Signed)
Procedure Name: MAC Date/Time: 04/11/2017 10:45 AM Performed by: Scheryl Darter Pre-anesthesia Checklist: Patient identified, Emergency Drugs available, Suction available, Patient being monitored and Timeout performed Oxygen Delivery Method: Simple face mask Placement Confirmation: positive ETCO2

## 2017-04-11 NOTE — Anesthesia Preprocedure Evaluation (Addendum)
Anesthesia Evaluation  Patient identified by MRN, date of birth, ID band Patient awake    Reviewed: Allergy & Precautions, NPO status , Patient's Chart, lab work & pertinent test results  Airway Mallampati: II       Dental no notable dental hx. (+) Poor Dentition   Pulmonary former smoker,    Pulmonary exam normal breath sounds clear to auscultation       Cardiovascular hypertension, Normal cardiovascular exam Rhythm:Regular Rate:Normal     Neuro/Psych    GI/Hepatic   Endo/Other    Renal/GU      Musculoskeletal   Abdominal   Peds  Hematology   Anesthesia Other Findings ANTOINNE SPADACCINI  ECHO COMPLETE WO IMAGE ENHANCING AGENT  Order# [id number]  Reading physician: Pixie Casino, MD Ordering physician: Ivor Costa, MD Study date: 11/03/16 Study Result   Result status: Final result                             *[facility] [employer].                        [place], [place] [address]                            [phone]  ------------------------------------------------------------------- Transthoracic Echocardiography  Patient:    [name], [name] MR #:       [id number] Study Date: 11/03/2016 Gender:     M Age:        71 Height:     190.5 cm Weight:     136.1 kg BSA:        2.73 m^2 Pt. Status: Room:       [place], [place] [id number]  [id number]     PXT, GGYIR [id number]  Alvie Heidelberg [id number]  SONOGRAPHER  Donata Clay  PERFORMING   [facility], Inpatient  ATTENDING    Duffy Bruce  cc:  ------------------------------------------------------------------- LV EF: 60% -   65%  ------------------------------------------------------------------- Indications:      Edema 782.3.  ------------------------------------------------------------------- History:   PMH:  Vascular congestion. Elevated BNP. UTI.  Fall. Hypothyroidism. Low testosterone. Atrial fibrillation. Hypertension. Psoriasis. GERD. Depression and anxiety. CAD. BPH. Hyperlipidemia. Mild AKI.  ------------------------------------------------------------------- Study Conclusions  - Left ventricle: Systolic function was normal. The estimated   ejection fraction was in the range of 60% to 65%. The study is   not technically sufficient to allow evaluation of LV diastolic   function. - Left atrium: Severely dilated. - Right atrium: Severely dilated.  Impressions:  - Technically difficult study with poor echo windows. The LVEF is   grossly normal at 60-65%, there is severe biatrial enlargment,   the IVC is dilated.  ------------------------------------------------------------------- Study data:  Comparison was made to the study of 02/23/2016.  Study status:  Routine.  Procedure:  The patient reported no pain pre or post test.  Study completion:  There were no complications.  Transthoracic echocardiography.  M-mode, complete 2D, spectral Doppler, and color Doppler.  Birthdate:  Patient birthdate: April 11, 1946.    Reproductive/Obstetrics  Anesthesia Physical Anesthesia Plan  ASA: III  Anesthesia Plan: Spinal   Post-op Pain Management:  Regional for Post-op pain   Induction:   PONV Risk Score and Plan: 1 and Ondansetron, Dexamethasone and Treatment may vary due to age or medical condition  Airway Management Planned: Natural Airway, Nasal Cannula and Simple Face Mask  Additional Equipment:   Intra-op Plan:   Post-operative Plan:   Informed Consent: I have reviewed the patients History and Physical, chart, labs and discussed the procedure including the risks, benefits and alternatives for the proposed anesthesia with the patient or authorized representative who has indicated his/her understanding and acceptance.     Plan Discussed with: Surgeon and  CRNA  Anesthesia Plan Comments:         Anesthesia Quick Evaluation

## 2017-04-11 NOTE — Discharge Instructions (Addendum)
INSTRUCTIONS AFTER JOINT REPLACEMENT  ° °o Remove items at home which could result in a fall. This includes throw rugs or furniture in walking pathways °o ICE to the affected joint every three hours while awake for 30 minutes at a time, for at least the first 3-5 days, and then as needed for pain and swelling.  Continue to use ice for pain and swelling. You may notice swelling that will progress down to the foot and ankle.  This is normal after surgery.  Elevate your leg when you are not up walking on it.   °o Continue to use the breathing machine you got in the hospital (incentive spirometer) which will help keep your temperature down.  It is common for your temperature to cycle up and down following surgery, especially at night when you are not up moving around and exerting yourself.  The breathing machine keeps your lungs expanded and your temperature down. ° ° °DIET:  As you were doing prior to hospitalization, we recommend a well-balanced diet. ° °DRESSING / WOUND CARE / SHOWERING ° °Keep the surgical dressing until follow up.  The dressing is water proof, so you can shower without any extra covering.  IF THE DRESSING FALLS OFF or the wound gets wet inside, change the dressing with sterile gauze.  Please use good hand washing techniques before changing the dressing.  Do not use any lotions or creams on the incision until instructed by your surgeon.   ° °ACTIVITY ° °o Increase activity slowly as tolerated, but follow the weight bearing instructions below.   °o No driving for 6 weeks or until further direction given by your physician.  You cannot drive while taking narcotics.  °o No lifting or carrying greater than 10 lbs. until further directed by your surgeon. °o Avoid periods of inactivity such as sitting longer than an hour when not asleep. This helps prevent blood clots.  °o You may return to work once you are authorized by your doctor.  ° ° ° °WEIGHT BEARING  ° °Weight bearing as tolerated with assist  device (walker, cane, etc) as directed, use it as long as suggested by your surgeon or therapist, typically at least 4-6 weeks. ° ° °EXERCISES ° °Results after joint replacement surgery are often greatly improved when you follow the exercise, range of motion and muscle strengthening exercises prescribed by your doctor. Safety measures are also important to protect the joint from further injury. Any time any of these exercises cause you to have increased pain or swelling, decrease what you are doing until you are comfortable again and then slowly increase them. If you have problems or questions, call your caregiver or physical therapist for advice.  ° °Rehabilitation is important following a joint replacement. After just a few days of immobilization, the muscles of the leg can become weakened and shrink (atrophy).  These exercises are designed to build up the tone and strength of the thigh and leg muscles and to improve motion. Often times heat used for twenty to thirty minutes before working out will loosen up your tissues and help with improving the range of motion but do not use heat for the first two weeks following surgery (sometimes heat can increase post-operative swelling).  ° °These exercises can be done on a training (exercise) mat, on the floor, on a table or on a bed. Use whatever works the best and is most comfortable for you.    Use music or television while you are exercising so that   the exercises are a pleasant break in your day. This will make your life better with the exercises acting as a break in your routine that you can look forward to.   Perform all exercises about fifteen times, three times per day or as directed.  You should exercise both the operative leg and the other leg as well. ° °Exercises include: °  °• Quad Sets - Tighten up the muscle on the front of the thigh (Quad) and hold for 5-10 seconds.   °• Straight Leg Raises - With your knee straight (if you were given a brace, keep it on),  lift the leg to 60 degrees, hold for 3 seconds, and slowly lower the leg.  Perform this exercise against resistance later as your leg gets stronger.  °• Leg Slides: Lying on your back, slowly slide your foot toward your buttocks, bending your knee up off the floor (only go as far as is comfortable). Then slowly slide your foot back down until your leg is flat on the floor again.  °• Angel Wings: Lying on your back spread your legs to the side as far apart as you can without causing discomfort.  °• Hamstring Strength:  Lying on your back, push your heel against the floor with your leg straight by tightening up the muscles of your buttocks.  Repeat, but this time bend your knee to a comfortable angle, and push your heel against the floor.  You may put a pillow under the heel to make it more comfortable if necessary.  ° °A rehabilitation program following joint replacement surgery can speed recovery and prevent re-injury in the future due to weakened muscles. Contact your doctor or a physical therapist for more information on knee rehabilitation.  ° ° °CONSTIPATION ° °Constipation is defined medically as fewer than three stools per week and severe constipation as less than one stool per week.  Even if you have a regular bowel pattern at home, your normal regimen is likely to be disrupted due to multiple reasons following surgery.  Combination of anesthesia, postoperative narcotics, change in appetite and fluid intake all can affect your bowels.  ° °YOU MUST use at least one of the following options; they are listed in order of increasing strength to get the job done.  They are all available over the counter, and you may need to use some, POSSIBLY even all of these options:   ° °Drink plenty of fluids (prune juice may be helpful) and high fiber foods °Colace 100 mg by mouth twice a day  °Senokot for constipation as directed and as needed Dulcolax (bisacodyl), take with full glass of water  °Miralax (polyethylene glycol)  once or twice a day as needed. ° °If you have tried all these things and are unable to have a bowel movement in the first 3-4 days after surgery call either your surgeon or your primary doctor.   ° °If you experience loose stools or diarrhea, hold the medications until you stool forms back up.  If your symptoms do not get better within 1 week or if they get worse, check with your doctor.  If you experience "the worst abdominal pain ever" or develop nausea or vomiting, please contact the office immediately for further recommendations for treatment. ° ° °ITCHING:  If you experience itching with your medications, try taking only a single pain pill, or even half a pain pill at a time.  You can also use Benadryl over the counter for itching or also to   help with sleep.  ° °TED HOSE STOCKINGS:  Use stockings on both legs until for at least 2 weeks or as directed by physician office. They may be removed at night for sleeping. ° °MEDICATIONS:  See your medication summary on the “After Visit Summary” that nursing will review with you.  You may have some home medications which will be placed on hold until you complete the course of blood thinner medication.  It is important for you to complete the blood thinner medication as prescribed. ° °PRECAUTIONS:  If you experience chest pain or shortness of breath - call 911 immediately for transfer to the hospital emergency department.  ° °If you develop a fever greater that 101 F, purulent drainage from wound, increased redness or drainage from wound, foul odor from the wound/dressing, or calf pain - CONTACT YOUR SURGEON.   °                                                °FOLLOW-UP APPOINTMENTS:  If you do not already have a post-op appointment, please call the office for an appointment to be seen by your surgeon.  Guidelines for how soon to be seen are listed in your “After Visit Summary”, but are typically between 1-4 weeks after surgery. ° °OTHER INSTRUCTIONS:  ° °Knee  Replacement:  Do not place pillow under knee, focus on keeping the knee straight while resting. CPM instructions: 0-90 degrees, 2 hours in the morning, 2 hours in the afternoon, and 2 hours in the evening. Place foam block, curve side up under heel at all times except when in CPM or when walking.  DO NOT modify, tear, cut, or change the foam block in any way. ° °MAKE SURE YOU:  °• Understand these instructions.  °• Get help right away if you are not doing well or get worse.  ° ° °Thank you for letting us be a part of your medical care team.  It is a privilege we respect greatly.  We hope these instructions will help you stay on track for a fast and full recovery!  ° °Information on my medicine - XARELTO® (Rivaroxaban) ° ° °Why was Xarelto® prescribed for you? °Xarelto® was prescribed for you to reduce the risk of blood clots forming after orthopedic surgery. The medical term for these abnormal blood clots is venous thromboembolism (VTE). ° °What do you need to know about xarelto® ? °Take your Xarelto® ONCE DAILY at the same time every day. °You may take it either with or without food. ° °If you have difficulty swallowing the tablet whole, you may crush it and mix in applesauce just prior to taking your dose. ° °Take Xarelto® exactly as prescribed by your doctor and DO NOT stop taking Xarelto® without talking to the doctor who prescribed the medication.  Stopping without other VTE prevention medication to take the place of Xarelto® may increase your risk of developing a clot. ° °After discharge, you should have regular check-up appointments with your healthcare provider that is prescribing your Xarelto®.   ° °What do you do if you miss a dose? °If you miss a dose, take it as soon as you remember on the same day then continue your regularly scheduled once daily regimen the next day. Do not take two doses of Xarelto® on the same day.  ° °Important Safety Information °A possible   side effect of Xarelto® is bleeding. You  should call your healthcare provider right away if you experience any of the following: °? Bleeding from an injury or your nose that does not stop. °? Unusual colored urine (red or dark brown) or unusual colored stools (red or black). °? Unusual bruising for unknown reasons. °? A serious fall or if you hit your head (even if there is no bleeding). ° °Some medicines may interact with Xarelto® and might increase your risk of bleeding while on Xarelto®. To help avoid this, consult your healthcare provider or pharmacist prior to using any new prescription or non-prescription medications, including herbals, vitamins, non-steroidal anti-inflammatory drugs (NSAIDs) and supplements. ° °This website has more information on Xarelto®: www.xarelto.com. ° ° ° °

## 2017-04-11 NOTE — Interval H&P Note (Signed)
History and Physical Interval Note:  04/11/2017 9:05 AM  [name]  has presented today for surgery, with the diagnosis of LOOSE LEFT TOTAL KNEE ARTHROPLASTY PATELLA  The various methods of treatment have been discussed with the patient and family. After consideration of risks, benefits and other options for treatment, the patient has consented to  Procedure(s): TOTAL KNEE REVISION PATELLA (Left) as a surgical intervention .  The patient's history has been reviewed, patient examined, no change in status, stable for surgery.  I have reviewed the patient's chart and labs.  Questions were answered to the patient's satisfaction.     Kerin Salen

## 2017-04-11 NOTE — Anesthesia Procedure Notes (Signed)
Spinal  Start time: 04/11/2017 10:47 AM End time: 04/11/2017 10:50 AM Staffing Anesthesiologist: Lyn Hollingshead Performed: anesthesiologist  Preanesthetic Checklist Completed: patient identified, surgical consent, pre-op evaluation, timeout performed, IV checked, risks and benefits discussed and monitors and equipment checked Spinal Block Patient position: sitting Prep: site prepped and draped and DuraPrep Patient monitoring: heart rate, cardiac monitor, continuous pulse ox and blood pressure Approach: midline Location: L3-4 Needle Needle type: Pencan  Needle gauge: 24 G Needle length: 10 cm Needle insertion depth: 5 cm Assessment Sensory level: T8

## 2017-04-11 NOTE — Anesthesia Procedure Notes (Addendum)
Anesthesia Regional Block: Adductor canal block   Pre-Anesthetic Checklist: ,, timeout performed, Correct Patient, Correct Site, Correct Laterality, Correct Procedure, Correct Position, site marked, Risks and benefits discussed,  Surgical consent,  Pre-op evaluation,  At surgeon's request and post-op pain management  Laterality: Left and Upper  Prep: chloraprep       Needles:  Injection technique: Single-shot  Needle Type: Echogenic Stimulator Needle     Needle Length: 9cm  Needle Gauge: 21     Additional Needles:   Procedures: ultrasound guided,,,,,,,, #20gu IV placed  Narrative:  Start time: 04/11/2017 10:25 AM End time: 04/11/2017 10:34 AM Injection made incrementally with aspirations every 5 mL.  Performed by: Personally  Anesthesiologist: Lyn Hollingshead

## 2017-04-12 LAB — BASIC METABOLIC PANEL
Anion gap: 7 (ref 5–15)
BUN: 23 mg/dL — AB (ref 6–20)
CALCIUM: 8.5 mg/dL — AB (ref 8.9–10.3)
CO2: 28 mmol/L (ref 22–32)
CREATININE: 1.42 mg/dL — AB (ref 0.61–1.24)
Chloride: 99 mmol/L — ABNORMAL LOW (ref 101–111)
GFR calc Af Amer: 56 mL/min — ABNORMAL LOW (ref >60)
GFR calc non Af Amer: 48 mL/min — ABNORMAL LOW (ref >60)
GLUCOSE: 121 mg/dL — AB (ref 65–99)
Potassium: 4.7 mmol/L (ref 3.5–5.1)
Sodium: 134 mmol/L — ABNORMAL LOW (ref 135–145)

## 2017-04-12 LAB — CBC
HEMATOCRIT: 32.2 % — AB (ref 39.0–52.0)
Hemoglobin: 9.7 g/dL — ABNORMAL LOW (ref 13.0–17.0)
MCH: 26.7 pg (ref 26.0–34.0)
MCHC: 30.1 g/dL (ref 30.0–36.0)
MCV: 88.7 fL (ref 78.0–100.0)
PLATELETS: 216 10*3/uL (ref 150–400)
RBC: 3.63 MIL/uL — ABNORMAL LOW (ref 4.22–5.81)
RDW: 16.9 % — AB (ref 11.5–15.5)
WBC: 6.2 10*3/uL (ref 4.0–10.5)

## 2017-04-12 NOTE — Evaluation (Signed)
Occupational Therapy Evaluation Patient Details Name: [name] MRN: [id number] DOB: 10/29/1945 Today's Date: 04/12/2017    History of Present Illness Patient is a 71 y/o male s/p TOTAL KNEE REVISION PATELLA and TIBIA (Left), PMH positive for R THR w/ multiple revisions, bilat wrist surg and B TKR, PAF, CAD, COPD, HTN and renal calculi.   Clinical Impression   PTA, pt was living with spouse and was independent with basic ADLs; pt's spouse would provide supervision for transfers. Currently, pt requires Min guard A - Min A for ADLs and functional mobility using RW. Provided education on LB ADLs; pt demonstrated understanding with Min A for safety in standing. Pt would benefit from acute OT to increase safety and independence with ADLs and functional mobility. Recommend dc home once medically stable per physician pending pt progress.     Follow Up Recommendations  DC plan and follow up therapy as arranged by surgeon;Supervision/Assistance - 24 hour   Equipment Recommendations  None recommended by OT    Recommendations for Other Services PT consult     Precautions / Restrictions Precautions Precautions: Fall;Knee Precaution Booklet Issued: No Precaution Comments: Reviewed resting with knee in extension Restrictions Weight Bearing Restrictions: Yes LLE Weight Bearing: Weight bearing as tolerated      Mobility Bed Mobility               General bed mobility comments: In recliner upin arrival  Transfers Overall transfer level: Needs assistance Equipment used: Rolling walker (2 wheeled) Transfers: Sit to/from Stand Sit to Stand: From elevated surface;Min assist         General transfer comment: Min A for safety and balance in standing. Pt demonstrating food hand palcement    Balance Overall balance assessment: Needs assistance Sitting-balance support: Feet supported;No upper extremity supported Sitting balance-Leahy Scale: Fair     Standing balance support:  Bilateral upper extremity supported Standing balance-Leahy Scale: Poor Standing balance comment: heavy UE assist for balance and increased R lateral lean with ambulation/fatigue                           ADL either performed or assessed with clinical judgement   ADL Overall ADL's : Needs assistance/impaired Eating/Feeding: Set up;Sitting   Grooming: Set up;Sitting   Upper Body Bathing: Set up;Sitting   Lower Body Bathing: Minimal assistance;Sit to/from stand   Upper Body Dressing : Set up;Sitting   Lower Body Dressing: Minimal assistance;Sit to/from stand Lower Body Dressing Details (indicate cue type and reason): Min A for standing balance and safety. Pt donned understand and demosntrated good ROM to reach down to BLE Toilet Transfer: Min guard;Ambulation;RW (Simulated to recliner)         Tub/Shower Transfer Details (indicate cue type and reason): Pt needs education on walk-in shower transfer   General ADL Comments: Pt stating "I have been through this before. I have had OT and PT." Provided education on LB dressing. Pt require Min A for safety and standing balance.      Vision         Perception     Praxis      Pertinent Vitals/Pain Pain Assessment: 0-10 Pain Score: 2  Pain Location: left knee Pain Descriptors / Indicators: Aching Pain Intervention(s): Monitored during session;Repositioned     Hand Dominance Left   Extremity/Trunk Assessment Upper Extremity Assessment Upper Extremity Assessment: Overall WFL for tasks assessed   Lower Extremity Assessment Lower Extremity Assessment: Defer to PT evaluation LLE  Deficits / Details: AAROM knee flexion about 70, extension about neutral, strength knee extension 2+/5, hip flexion 2+/5   Cervical / Trunk Assessment Cervical / Trunk Assessment: Lordotic;Other exceptions Cervical / Trunk Exceptions: R lateral trunk lean   Communication Communication Communication: No difficulties   Cognition  Arousal/Alertness: Awake/alert Behavior During Therapy: WFL for tasks assessed/performed Overall Cognitive Status: Within Functional Limits for tasks assessed                                     General Comments       Exercises     Shoulder Instructions      Home Living Family/patient expects to be discharged to:: Private residence Living Arrangements: Spouse/significant other Available Help at Discharge: Family;Available 24 hours/day Type of Home: House Home Access: Stairs to enter [employer] of Steps: 4 Entrance Stairs-Rails: Right Home Layout: Two level;Bed/bath upstairs Alternate Level Stairs-Number of Steps: chair lift   Bathroom Shower/Tub: Walk-in shower   Bathroom Toilet: Handicapped height     Home Equipment: Environmental consultant - 2 wheels;Bedside commode;Cane - single point;Shower seat;Grab bars - tub/shower;Hand held shower head;Adaptive equipment Adaptive Equipment: Reacher Additional Comments: pt states he has all DME at home (and then some).  He has had multiple orthopedic sxs      Prior Functioning/Environment Level of Independence: Independent with assistive device(s)        Comments: used walker when needed        OT Problem List: Decreased strength;Decreased activity tolerance;Impaired balance (sitting and/or standing);Decreased safety awareness;Decreased knowledge of use of DME or AE;Decreased knowledge of precautions;Pain      OT Treatment/Interventions: Self-care/ADL training;Therapeutic exercise;Energy conservation;DME and/or AE instruction;Therapeutic activities;Patient/family education    OT Goals(Current goals can be found in the care plan section) Acute Rehab OT Goals Patient Stated Goal: To go home OT Goal Formulation: With patient Time For Goal Achievement: 04/26/17 Potential to Achieve Goals: Good ADL Goals Pt Will Perform Grooming: with min guard assist;standing Pt Will Perform Lower Body Dressing: with min guard  assist;sit to/from stand Pt Will Transfer to Toilet: with min guard assist;bedside commode;ambulating Pt Will Perform Toileting - Clothing Manipulation and hygiene: with min guard assist;sit to/from stand Pt Will Perform Tub/Shower Transfer: Shower transfer;ambulating;3 in 1;rolling walker;with min guard assist  OT Frequency: Min 2X/week   Barriers to D/C:            Co-evaluation              AM-PAC PT "6 Clicks" Daily Activity     Outcome Measure Help from another person eating meals?: None Help from another person taking care of personal grooming?: A Little Help from another person toileting, which includes using toliet, bedpan, or urinal?: A Little Help from another person bathing (including washing, rinsing, drying)?: A Little Help from another person to put on and taking off regular upper body clothing?: None Help from another person to put on and taking off regular lower body clothing?: A Little 6 Click Score: 20   End of Session Equipment Utilized During Treatment: Rolling walker Nurse Communication: Mobility status  Activity Tolerance: Patient tolerated treatment well Patient left: in chair;with call bell/phone within reach  OT Visit Diagnosis: Unsteadiness on feet (R26.81);Other abnormalities of gait and mobility (R26.89);Muscle weakness (generalized) (M62.81);Pain Pain - Right/Left: Left Pain - part of body: Knee                Time:  1586-8257 OT Time Calculation (min): 22 min Charges:  OT General Charges $OT Visit: 1 Procedure OT Evaluation $OT Eval Low Complexity: 1 Procedure G-Codes:     [place], OTR/L Acute Rehab Pager: [phone] Office: [place] 04/12/2017, 2:32 PM

## 2017-04-12 NOTE — Evaluation (Signed)
Physical Therapy Evaluation Patient Details Name: [name] MRN: [id number] DOB: Aug 26, 1946 Today's Date: 04/12/2017   History of Present Illness  Patient is a 71 y/o male s/p TOTAL KNEE REVISION PATELLA and TIBIA (Left), PMH positive for R THR w/ multiple revisions, bilat wrist surg and B TKR, PAF, CAD, COPD, HTN and renal calculi.  Clinical Impression  Patient presents with decreased independence and safety with mobility due to deficits listed in PT problem list.  He was able to ambulate in hallway, but limited due to weakness/fatigue with increased R lateral lean and decreased walker safety over time.  Feel he will need skilled PT in the acute setting to allow d/c home with spouse assist and follow up HHPT.     Follow Up Recommendations Home health PT;Supervision/Assistance - 24 hour;DC plan and follow up therapy as arranged by surgeon    Equipment Recommendations  None recommended by PT    Recommendations for Other Services       Precautions / Restrictions Precautions Precautions: Fall;Knee Restrictions Weight Bearing Restrictions: Yes LLE Weight Bearing: Weight bearing as tolerated      Mobility  Bed Mobility Overal bed mobility: Needs Assistance Bed Mobility: Supine to Sit     Supine to sit: Min assist     General bed mobility comments: assist for L LE, increased time, heavy UE use  Transfers Overall transfer level: Needs assistance   Transfers: Sit to/from Stand Sit to Stand: From elevated surface;Mod assist         General transfer comment: increased time, lifting help from edge of bed; to sit cues for hand placement, and L LE management  Ambulation/Gait Ambulation/Gait assistance: Mod assist;Min assist Ambulation Distance (Feet): 100 Feet Assistive device: Rolling walker (2 wheeled) Gait Pattern/deviations: Step-through pattern;Step-to pattern;Decreased stride length;Trunk flexed;Shuffle;Wide base of support     General Gait Details: heavy UE  reliance, over time developed increased R lateral lean, and increased proximity to walker needing more help for walker safety, keeping from lateral LOB and stops to fix posture along the way  Stairs            Wheelchair Mobility    Modified Rankin (Stroke Patients Only)       Balance Overall balance assessment: Needs assistance   Sitting balance-Leahy Scale: Fair     Standing balance support: Bilateral upper extremity supported Standing balance-Leahy Scale: Poor Standing balance comment: heavy UE assist for balance and increased R lateral lean with ambulation/fatigue                             Pertinent Vitals/Pain Pain Assessment: 0-10 Pain Score: 2  Pain Location: left knee Pain Descriptors / Indicators: Aching Pain Intervention(s): Monitored during session    Home Living Family/patient expects to be discharged to:: Private residence Living Arrangements: Spouse/significant other   Type of Home: House Home Access: Stairs to enter Entrance Stairs-Rails: Right Entrance Stairs-Number of Steps: 4 Home Layout: Two level;Bed/bath upstairs Home Equipment: Walker - 2 wheels;Bedside commode;Cane - single point;Shower seat      Prior Function Level of Independence: Independent with assistive device(s)         Comments: used walker when needed     Hand Dominance        Extremity/Trunk Assessment   Upper Extremity Assessment Upper Extremity Assessment: Defer to OT evaluation    Lower Extremity Assessment Lower Extremity Assessment: LLE deficits/detail LLE Deficits / Details: AAROM knee flexion about 70,  extension about neutral, strength knee extension 2+/5, hip flexion 2+/5    Cervical / Trunk Assessment Cervical / Trunk Assessment: Lordotic;Other exceptions Cervical / Trunk Exceptions: R lateral trunk lean  Communication   Communication: No difficulties  Cognition Arousal/Alertness: Awake/alert Behavior During Therapy: WFL for tasks  assessed/performed Overall Cognitive Status: Within Functional Limits for tasks assessed                                        General Comments General comments (skin integrity, edema, etc.): HR 111, SpO2 95%, BP 144/71 after ambulation; dyspnea 3/4    Exercises Total Joint Exercises Ankle Circles/Pumps: AROM;Both;10 reps;Supine Quad Sets: AROM;Left;10 reps;Supine Short Arc Quad: AROM;AAROM;Left;10 reps;Supine Heel Slides: AAROM;Left;10 reps;Supine Hip ABduction/ADduction: AAROM;AROM;Left;10 reps;Supine   Assessment/Plan    PT Assessment Patient needs continued PT services  PT Problem List Decreased balance;Decreased range of motion;Decreased strength;Decreased mobility;Decreased knowledge of use of DME;Pain       PT Treatment Interventions Gait training;DME instruction;Stair training;Functional mobility training;Balance training;Therapeutic exercise;Patient/family education;Therapeutic activities    PT Goals (Current goals can be found in the Care Plan section)  Acute Rehab PT Goals Patient Stated Goal: To go home PT Goal Formulation: With patient Time For Goal Achievement: 04/15/17 Potential to Achieve Goals: Good    Frequency 7X/week   Barriers to discharge        Co-evaluation               AM-PAC PT "6 Clicks" Daily Activity  Outcome Measure Difficulty turning over in bed (including adjusting bedclothes, sheets and blankets)?: A Lot Difficulty moving from lying on back to sitting on the side of the bed? : Total Difficulty sitting down on and standing up from a chair with arms (e.g., wheelchair, bedside commode, etc,.)?: Total Help needed moving to and from a bed to chair (including a wheelchair)?: A Little Help needed walking in hospital room?: A Lot Help needed climbing 3-5 steps with a railing? : A Lot 6 Click Score: 11    End of Session Equipment Utilized During Treatment: Gait belt Activity Tolerance: Patient limited by  fatigue Patient left: in chair;with call bell/phone within reach   PT Visit Diagnosis: Other abnormalities of gait and mobility (R26.89);Pain;History of falling (Z91.81) Pain - Right/Left: Left Pain - part of body: Knee    Time: 2993-7169 PT Time Calculation (min) (ACUTE ONLY): 42 min   Charges:   PT Evaluation $PT Eval Moderate Complexity: 1 Procedure PT Treatments $Gait Training: 8-22 mins $Therapeutic Exercise: 8-22 mins   PT G CodesMagda Kiel, [place] [phone] 04/12/2017   Reginia Naas 04/12/2017, 10:25 AM

## 2017-04-12 NOTE — Progress Notes (Signed)
PATIENT ID: [name]  MRN: [id number]  DOB/AGE:  72-15-1947 / 71 y.o.  1 Day Post-Op Procedure(s) (LRB): TOTAL KNEE REVISION PATELLA and TIBIA (Left)    PROGRESS NOTE Subjective: Patient is alert, oriented, no Nausea, no Vomiting, yes passing gas. Taking PO well. Denies SOB, Chest or Calf Pain. Using Incentive Spirometer, PAS in place. Ambulate WBAT, Patient reports pain as 3/10 .    Objective: Vital signs in last 24 hours: Vitals:   04/11/17 1430 04/11/17 1445 04/11/17 1500 04/12/17 0100  BP: (!) 109/58 114/60 (!) 122/56 (!) 111/56  Pulse: 69 76 75 89  Resp: 16 20 19 20   Temp:   97.6 F (36.4 C) 98.7 F (37.1 C)  TempSrc:   Oral Oral  SpO2: 96% 99% 93% 95%  Weight:      Height:          Intake/Output from previous day: I/O last 3 completed shifts: In: 2690 [P.O.:480; I.V.:1800; IV Piggyback:410] Out: 1700 [Urine:800; Blood:900]   Intake/Output this shift: No intake/output data recorded.   LABORATORY DATA:  Recent Labs  04/11/17 0836  INR 1.18    Examination: Neurologically intact ABD soft Neurovascular intact Sensation intact distally Intact pulses distally Dorsiflexion/Plantar flexion intact Incision: dressing C/D/I No cellulitis present Compartment soft}  Assessment:   1 Day Post-Op Procedure(s) (LRB): TOTAL KNEE REVISION PATELLA and TIBIA (Left) ADDITIONAL DIAGNOSIS: Expected Acute Blood Loss Anemia, osteoporosis, renal stones, afib  Plan: PT/OT WBAT, AROM and PROM  DVT Prophylaxis:  SCDx72hrs, ASA 325 mg BID x 2 weeks DISCHARGE PLAN: Home DISCHARGE NEEDS: HHPT, Walker and 3-in-1 comode seat     Shawne Eskelson J 04/12/2017, 6:49 AM Patient ID: [id number] Lane, male   DOB: [birth year], 71 y.o.   MRN: [id number]

## 2017-04-12 NOTE — Progress Notes (Signed)
Physical Therapy Treatment Patient Details Name: [name] MRN: [id number] DOB: Nov 15, 1945 Today's Date: 04/12/2017    History of Present Illness Patient is a 71 y/o male s/p TOTAL KNEE REVISION PATELLA and TIBIA (Left), PMH positive for R THR w/ multiple revisions, bilat wrist surg and B TKR, PAF, CAD, COPD, HTN and renal calculi.    PT Comments    Patient progressing with ambulation distance and safety.  Still with some R lateral lean with fatigue, but corrected with cues.  SOB with activity, but improved overall.  Continue to feel should be safe for d/c home with spouse assist and HHPT.    Follow Up Recommendations  Home health PT;Supervision/Assistance - 24 hour;DC plan and follow up therapy as arranged by surgeon     Equipment Recommendations  None recommended by PT    Recommendations for Other Services       Precautions / Restrictions Precautions Precautions: Fall;Knee Precaution Booklet Issued: No Precaution Comments: Reviewed resting with knee in extension Restrictions Weight Bearing Restrictions: Yes LLE Weight Bearing: Weight bearing as tolerated    Mobility  Bed Mobility Overal bed mobility: Needs Assistance Bed Mobility: Sit to Supine       Sit to supine: Min guard   General bed mobility comments: cues for positioning  Transfers Overall transfer level: Needs assistance Equipment used: Rolling walker (2 wheeled) Transfers: Sit to/from Stand Sit to Stand: Min assist         General transfer comment: cues for technique  Ambulation/Gait Ambulation/Gait assistance: Min guard;Min assist Ambulation Distance (Feet): 140 Feet Assistive device: Rolling walker (2 wheeled) Gait Pattern/deviations: Step-through pattern;Decreased stride length;Trunk flexed;Shuffle     General Gait Details: cues again for L lateral weight shift and proximity   Stairs            Wheelchair Mobility    Modified Rankin (Stroke Patients Only)       Balance  Overall balance assessment: Needs assistance Sitting-balance support: Feet supported;No upper extremity supported Sitting balance-Leahy Scale: Good     Standing balance support: Bilateral upper extremity supported Standing balance-Leahy Scale: Poor Standing balance comment: UE support for balance                            Cognition Arousal/Alertness: Awake/alert Behavior During Therapy: WFL for tasks assessed/performed Overall Cognitive Status: Within Functional Limits for tasks assessed                                        Exercises Total Joint Exercises Heel Slides: AAROM;Left;10 reps;Seated Straight Leg Raises: AROM;Left;10 reps;Supine Long Arc Quad: AROM;Left;10 reps;Seated Goniometric ROM: 0-75 EOB    General Comments        Pertinent Vitals/Pain Pain Assessment: 0-10 Pain Score: 2  Pain Location: left knee Pain Descriptors / Indicators: Aching Pain Intervention(s): Monitored during session    Home Living    Prior Function    PT Goals (current goals can now be found in the care plan section) Acute Rehab PT Goals Patient Stated Goal: To go home Progress towards PT goals: Progressing toward goals    Frequency    7X/week      PT Plan Current plan remains appropriate    Co-evaluation              AM-PAC PT "6 Clicks" Daily Activity  Outcome Measure  Difficulty  turning over in bed (including adjusting bedclothes, sheets and blankets)?: A Lot Difficulty moving from lying on back to sitting on the side of the bed? : Total Difficulty sitting down on and standing up from a chair with arms (e.g., wheelchair, bedside commode, etc,.)?: Total Help needed moving to and from a bed to chair (including a wheelchair)?: A Little Help needed walking in hospital room?: A Little Help needed climbing 3-5 steps with a railing? : A Lot 6 Click Score: 12    End of Session Equipment Utilized During Treatment: Gait belt Activity  Tolerance: Patient tolerated treatment well Patient left: in bed;with call bell/phone within reach   PT Visit Diagnosis: Other abnormalities of gait and mobility (R26.89);Pain;History of falling (Z91.81) Pain - Right/Left: Left Pain - part of body: Knee     Time: 1421-1444 PT Time Calculation (min) (ACUTE ONLY): 23 min  Charges:  $Gait Training: 8-22 mins $Therapeutic Exercise: 8-22 mins                    G CodesMagda Kiel, [place] [phone] 04/12/2017    Reginia Naas 04/12/2017, 5:14 PM

## 2017-04-13 LAB — CBC
HEMATOCRIT: 31.3 % — AB (ref 39.0–52.0)
HEMOGLOBIN: 9.5 g/dL — AB (ref 13.0–17.0)
MCH: 26.8 pg (ref 26.0–34.0)
MCHC: 30.4 g/dL (ref 30.0–36.0)
MCV: 88.2 fL (ref 78.0–100.0)
Platelets: 188 10*3/uL (ref 150–400)
RBC: 3.55 MIL/uL — AB (ref 4.22–5.81)
RDW: 16.6 % — ABNORMAL HIGH (ref 11.5–15.5)
WBC: 6.6 10*3/uL (ref 4.0–10.5)

## 2017-04-13 NOTE — Progress Notes (Signed)
PATIENT ID: [name]  MRN: [id number]  DOB/AGE:  72-Jun-1947 / 71 y.o.  2 Days Post-Op Procedure(s) (LRB): TOTAL KNEE REVISION PATELLA and TIBIA (Left)    PROGRESS NOTE Subjective: Patient is alert, oriented, no Nausea, no Vomiting, yes passing gas. Taking PO well. Denies SOB, Chest or Calf Pain. Using Incentive Spirometer, PAS in place. Ambulate WBAT with pt walking 140 ft with therapy, Patient reports pain as mild to moderate .    Objective: Vital signs in last 24 hours: Vitals:   04/12/17 0708 04/12/17 1500 04/12/17 2100 04/13/17 0703  BP: (!) 113/53 (!) 129/49 (!) 141/65 138/65  Pulse: 93 80 93 90  Resp: 20 19 20 18   Temp: 97.9 F (36.6 C) 98.6 F (37 C) 98.3 F (36.8 C) 98.1 F (36.7 C)  TempSrc: Oral Oral Oral Oral  SpO2: 95% 96% 97% 96%  Weight:      Height:          Intake/Output from previous day: I/O last 3 completed shifts: In: 1440 [P.O.:1440] Out: 600 [Urine:600]   Intake/Output this shift: Total I/O In: -  Out: 650 [Urine:650]   LABORATORY DATA:  Recent Labs  04/11/17 0836 04/12/17 0557 04/13/17 0434  WBC  --  6.2 6.6  HGB  --  9.7* 9.5*  HCT  --  32.2* 31.3*  PLT  --  216 188  NA  --  134*  --   K  --  4.7  --   CL  --  99*  --   CO2  --  28  --   BUN  --  23*  --   CREATININE  --  1.42*  --   GLUCOSE  --  121*  --   INR 1.18  --   --   CALCIUM  --  8.5*  --     Examination: Neurologically intact Neurovascular intact Sensation intact distally Intact pulses distally Dorsiflexion/Plantar flexion intact Incision: dressing C/D/I and no drainage No cellulitis present Compartment soft}  Assessment:   2 Days Post-Op Procedure(s) (LRB): TOTAL KNEE REVISION PATELLA and TIBIA (Left) ADDITIONAL DIAGNOSIS: Expected Acute Blood Loss Anemia, osteoporosis, renal stones, afib  Plan: PT/OT WBAT, AROM and PROM  DVT Prophylaxis:  SCDx72hrs, Xarelto 10 mg x 2 weeks DISCHARGE PLAN: Home DISCHARGE NEEDS: HHPT, Walker and 3-in-1 comode  seat     Lizette Pazos R 04/13/2017, 7:49 AM

## 2017-04-13 NOTE — Progress Notes (Signed)
Occupational Therapy Treatment Patient Details Name: [name] MRN: [id number] DOB: Jul 09, 1946 Today's Date: 04/13/2017    History of present illness Patient is a 71 y/o male s/p TOTAL KNEE REVISION PATELLA and TIBIA (Left), PMH positive for R THR w/ multiple revisions, bilat wrist surg and B TKR, PAF, CAD, COPD, HTN and renal calculi.   OT comments  Pt will have spouse (A) at d/c and has DME at home. Pt educated on adl ans bathing.   Follow Up Recommendations  DC plan and follow up therapy as arranged by surgeon;Supervision/Assistance - 24 hour    Equipment Recommendations  None recommended by OT    Recommendations for Other Services      Precautions / Restrictions Precautions Precautions: Fall;Knee Precaution Comments: Reviewed resting with knee in extension Restrictions Weight Bearing Restrictions: Yes LLE Weight Bearing: Weight bearing as tolerated       Mobility Bed Mobility Overal bed mobility: Needs Assistance Bed Mobility: Sit to Supine     Supine to sit: Supervision     General bed mobility comments: increased time, effort to scoot to EOB  Transfers Overall transfer level: Needs assistance Equipment used: Rolling walker (2 wheeled) Transfers: Sit to/from Stand Sit to Stand: Min guard         General transfer comment: initially up without assist from low bed, but then assist to bring COG over BOS    Balance Overall balance assessment: Needs assistance   Sitting balance-Leahy Scale: Good     Standing balance support: Bilateral upper extremity supported;During functional activity Standing balance-Leahy Scale: Poor Standing balance comment: requires RW for stability                           ADL either performed or assessed with clinical judgement   ADL Overall ADL's : Needs assistance/impaired     Grooming: Wash/dry hands;Wash/dry face;Supervision/safety                   Toilet Transfer: Supervision/safety;RW (has  3n1 at home)   Toileting- Water quality scientist and Hygiene: Supervision/safety   Tub/ Shower Transfer: English as a second language teacher Details (indicate cue type and reason): cues for sequence Functional mobility during ADLs: Supervision/safety;Rolling walker   Pt educated on bathing and avoid washing directly on incision. Pt educated to use new wash cloth and towel each day. Pt educated to allow water to run across dressing and not to soak in a tub at this time. Pt advised RN will instruct on any bandages required otherwise is open to air.        Vision       Perception     Praxis      Cognition Arousal/Alertness: Awake/alert Behavior During Therapy: WFL for tasks assessed/performed Overall Cognitive Status: Within Functional Limits for tasks assessed                                          Exercises    Shoulder Instructions       General Comments t    Pertinent Vitals/ Pain       Pain Assessment: No/denies pain Faces Pain Scale: Hurts a little bit Pain Location: left knee Pain Descriptors / Indicators: Aching Pain Intervention(s): Monitored during session  Home Living  Prior Functioning/Environment              Frequency  Min 2X/week        Progress Toward Goals  OT Goals(current goals can now be found in the care plan section)  Progress towards OT goals: Progressing toward goals  Acute Rehab OT Goals Patient Stated Goal: To go home OT Goal Formulation: With patient Time For Goal Achievement: 04/26/17 Potential to Achieve Goals: Good ADL Goals Pt Will Perform Grooming: with min guard assist;standing (met) Pt Will Perform Lower Body Dressing: with min guard assist;sit to/from stand Pt Will Transfer to Toilet: with min guard assist;bedside commode;ambulating (met) Pt Will Perform Toileting - Clothing Manipulation and hygiene: with min guard assist;sit to/from  stand (met) Pt Will Perform Tub/Shower Transfer: Shower transfer;ambulating;3 in 1;rolling walker;with min guard assist (met)  Plan Discharge plan remains appropriate    Co-evaluation                 AM-PAC PT "6 Clicks" Daily Activity     Outcome Measure   Help from another person eating meals?: None Help from another person taking care of personal grooming?: A Little Help from another person toileting, which includes using toliet, bedpan, or urinal?: A Little Help from another person bathing (including washing, rinsing, drying)?: A Little Help from another person to put on and taking off regular upper body clothing?: None Help from another person to put on and taking off regular lower body clothing?: A Little 6 Click Score: 20    End of Session Equipment Utilized During Treatment: Rolling walker  OT Visit Diagnosis: Unsteadiness on feet (R26.81);Other abnormalities of gait and mobility (R26.89);Muscle weakness (generalized) (M62.81);Pain Pain - Right/Left: Left Pain - part of body: Knee   Activity Tolerance Patient tolerated treatment well   Patient Left in chair;with call bell/phone within reach   Nurse Communication Mobility status        Time: 2130-8657 OT Time Calculation (min): 19 min  Charges: OT General Charges $OT Visit: 1 Procedure OT Treatments $Self Care/Home Management : 8-22 mins   Jeri Modena   OTR/L Pager: [phone] Office: [phone] .    Parke Poisson B 04/13/2017, 1:11 PM

## 2017-04-13 NOTE — Discharge Summary (Signed)
Patient ID: [name] MRN: [id number] DOB/AGE: 1946/03/25 71 y.o.  Admit date: 04/11/2017 Discharge date: 04/13/2017  Admission Diagnoses:  Principal Problem:   Failed total knee, left, initial encounter [facility]) Active Problems:   S/P revision of total knee   Discharge Diagnoses:  Same  Past Medical History:  Diagnosis Date  . A-fib (HCC)    hx.- sinus rhythm now  . Acid reflux   . Acute blood loss anemia   . Anxiety   . Arthritis   . BPH (benign prostatic hyperplasia)   . CAD (coronary artery disease)   . Depression   . Dislocation of internal right hip prosthesis, sequela   . Environmental allergies   . Fracture of wrist 10/23/13   LEFT  . High cholesterol   . History of kidney stones    surg. removal 02/2017  . HLD (hyperlipidemia)   . Hypertension   . Hypothyroidism    thyroid removed  . Left knee injury    cap dislocated  . Low testosterone, possible hypogonadism 10/01/2012  . Lumbago   . Metabolic bone disease   . Osteoporosis   . Plaque psoriasis   . Sleep apnea    cpap machine-settings 17  . Thrombocytopenia ([place])   . Thyroid disease    HX GRAVES DISEASE  . Transfusion history    s/p 12'13 hip surgery  . Unsteady gait   . Unsteady gait   . Vertebral compression fracture (HCC)    L1-wears brace    Surgeries: Procedure(s): TOTAL KNEE REVISION PATELLA and TIBIA on 04/11/2017   Consultants:   Discharged Condition: Improved  Hospital Course: [name] is an 71 y.o. male who was admitted 04/11/2017 for operative treatment ofFailed total knee, left, initial encounter ([place]). Patient has severe unremitting pain that affects sleep, daily activities, and work/hobbies. After pre-op clearance the patient was taken to the operating room on 04/11/2017 and underwent  Procedure(s): [place] and TIBIA.    Patient was given perioperative antibiotics: Anti-infectives    Start     Dose/Rate Route Frequency Ordered Stop   04/12/17 1000   doxycycline (VIBRA-TABS) tablet 100 mg     100 mg Oral Daily 04/11/17 1716     04/11/17 1730  ceFAZolin (ANCEF) 3 g in dextrose 5 % 50 mL IVPB     3 g 130 mL/hr over 30 Minutes Intravenous Every 6 hours 04/11/17 1716 04/11/17 2355   04/11/17 0845  ceFAZolin (ANCEF) 3 g in dextrose 5 % 50 mL IVPB     3 g 160 mL/hr over 30 Minutes Intravenous To ShortStay Surgical 04/11/17 0837 04/11/17 1052   04/11/17 0830  ceFAZolin (ANCEF) IVPB 2g/100 mL premix  Status:  Discontinued     2 g 200 mL/hr over 30 Minutes Intravenous On call to O.R. 04/11/17 0830 04/11/17 0841       Patient was given sequential compression devices, early ambulation, and chemoprophylaxis to prevent DVT.  Patient benefited maximally from hospital stay and there were no complications.    Recent vital signs: Patient Vitals for the past 24 hrs:  BP Temp Temp src Pulse Resp SpO2  04/13/17 0703 138/65 98.1 F (36.7 C) Oral 90 18 96 %  04/12/17 2100 (!) 141/65 98.3 F (36.8 C) Oral 93 20 97 %  04/12/17 1500 (!) 129/49 98.6 F (37 C) Oral 80 19 96 %     Recent laboratory studies:  Recent Labs  04/11/17 0836 04/12/17 0557 04/13/17 0434  WBC  --  6.2 6.6  HGB  --  9.7* 9.5*  HCT  --  32.2* 31.3*  PLT  --  216 188  NA  --  134*  --   K  --  4.7  --   CL  --  99*  --   CO2  --  28  --   BUN  --  23*  --   CREATININE  --  1.42*  --   GLUCOSE  --  121*  --   INR 1.18  --   --   CALCIUM  --  8.5*  --      Discharge Medications:   Allergies as of 04/13/2017   No Known Allergies     Medication List    STOP taking these medications   acetaminophen 500 MG tablet Commonly known as:  TYLENOL   HYDROcodone-acetaminophen 7.5-325 MG tablet Commonly known as:  NORCO     TAKE these medications   alfuzosin 10 MG 24 hr tablet Commonly known as:  UROXATRAL Take 10 mg by mouth at bedtime.   amiodarone 200 MG tablet Commonly known as:  PACERONE Take 200 mg by mouth daily.   amoxicillin 500 MG capsule Commonly  known as:  AMOXIL Take 2,000 mg by mouth See admin instructions. Takes 4 capsules 1 hour prior to dental procedures   atorvastatin 80 MG tablet Commonly known as:  LIPITOR Take 80 mg by mouth 2 (two) times a week. Monday & Friday evenings ONLY   buPROPion 300 MG 24 hr tablet Commonly known as:  WELLBUTRIN XL Take 300 mg by mouth daily with breakfast.   CALCIUM CITRATE + D PO Take 1 tablet by mouth 2 (two) times daily.   clonazePAM 1 MG tablet Commonly known as:  KLONOPIN Take one tablet by mouth every 6 hours What changed:  how much to take  how to take this  when to take this  reasons to take this  additional instructions   denosumab 60 MG/ML Soln injection Commonly known as:  PROLIA Inject 60 mg into the skin every 6 (six) months. Administer in upper arm, thigh, or abdomen   diltiazem 240 MG 24 hr capsule Commonly known as:  CARDIZEM CD Take 240 mg by mouth daily.   doxycycline 100 MG tablet Commonly known as:  VIBRA-TABS Take 100 mg by mouth daily.   DULoxetine 60 MG capsule Commonly known as:  CYMBALTA Take 60 mg by mouth 2 (two) times daily.   ENBREL 50 MG/ML injection Generic drug:  etanercept Inject 50 mg into the skin 2 (two) times a week. Tuesday & Friday   EPIPEN 2-PAK 0.3 mg/0.3 mL Soaj injection Generic drug:  EPINEPHrine Inject 0.3 mg into the muscle daily as needed (for anaphylactic reaction).   IRON 27 PO Take 1 tablet by mouth 2 (two) times daily.   levothyroxine 200 MCG tablet Commonly known as:  SYNTHROID, LEVOTHROID Take 200 mcg by mouth daily at 6 (six) AM. (0500)   levothyroxine 25 MCG tablet Commonly known as:  SYNTHROID, LEVOTHROID Take 25 mcg by mouth daily before breakfast.   loratadine 10 MG tablet Commonly known as:  CLARITIN Take 10 mg by mouth daily.   multivitamin with minerals Tabs tablet Take 1 tablet by mouth daily. Men's One-A-Day 50+   nitrofurantoin 100 MG capsule Commonly known as:  MACRODANTIN Take 100 mg  by mouth 2 (two) times daily.   nitroGLYCERIN 0.4 MG SL tablet Commonly known as:  NITROSTAT Place 0.4 mg under the  tongue every 5 (five) minutes as needed for chest pain. x3 doses as needed for chest pain   omeprazole 40 MG capsule Commonly known as:  PRILOSEC Take 40 mg by mouth daily before breakfast.   oxyCODONE-acetaminophen 5-325 MG tablet Commonly known as:  PERCOCET/ROXICET Take 1-2 tablets by mouth every 4 (four) hours as needed for moderate pain.   polyethylene glycol packet Commonly known as:  MIRALAX / GLYCOLAX Take 17 g by mouth daily as needed for mild constipation.   PROBIOTIC PO Take 1 capsule by mouth daily with breakfast.   ranolazine 500 MG 12 hr tablet Commonly known as:  RANEXA Take 500 mg by mouth 2 (two) times daily.   rivaroxaban 20 MG Tabs tablet Commonly known as:  XARELTO Take 1 tablet (20 mg total) by mouth daily. Start taking on:  04/25/2017 What changed:  These instructions start on 04/25/2017. If you are unsure what to do until then, ask your doctor or other care provider.   sulfamethoxazole-trimethoprim 800-160 MG tablet Commonly known as:  BACTRIM DS,SEPTRA DS Take 1 tablet by mouth 2 (two) times daily.   testosterone cypionate 200 MG/ML injection Commonly known as:  DEPOTESTOSTERONE CYPIONATE Inject 200mg  (66ml) intramuscularly every 14 days What changed:  how much to take  how to take this  when to take this  additional instructions   tiZANidine 4 MG tablet Commonly known as:  ZANAFLEX Take 1 tablet (4 mg total) by mouth every 6 (six) hours as needed for muscle spasms.   vitamin C 500 MG tablet Commonly known as:  ASCORBIC ACID Take 500 mg by mouth daily.   Vitamin D 2000 units Caps Take 2,000 Units by mouth 2 (two) times daily.            Durable Medical Equipment        Start     Ordered   04/11/17 1716  DME Walker rolling  Once    Question:  Patient needs a walker to treat with the following condition  Answer:   S/P revision of total knee   04/11/17 1716   04/11/17 1716  DME 3 n 1  Once     04/11/17 1716   04/11/17 1716  DME Bedside commode  Once    Question:  Patient needs a bedside commode to treat with the following condition  Answer:  S/P revision of total knee   04/11/17 1716      Diagnostic Studies: Dg Tibia/fibula Left Port  Result Date: 04/11/2017 CLINICAL DATA:  71 year old male post revision left total knee arthroplasty. Initial encounter. EXAM: PORTABLE LEFT TIBIA AND FIBULA - 2 VIEW COMPARISON:  10/29/2016. FINDINGS: Post revision left knee prosthesis with long tibial stem component without complication noted. IMPRESSION: Post left knee revision without complication noted. Electronically Signed   By: Genia Del M.D.   On: 04/11/2017 19:32    Disposition: 01-Home or Self Care  Discharge Instructions    Call MD / Call 911    Complete by:  As directed    If you experience chest pain or shortness of breath, CALL 911 and be transported to the hospital emergency room.  If you develope a fever above 101 F, pus (white drainage) or increased drainage or redness at the wound, or calf pain, call your surgeon's office.   Constipation Prevention    Complete by:  As directed    Drink plenty of fluids.  Prune juice may be helpful.  You may use a stool softener, such as Colace (  over the counter) 100 mg twice a day.  Use MiraLax (over the counter) for constipation as needed.   Diet - low sodium heart healthy    Complete by:  As directed    Driving restrictions    Complete by:  As directed    No driving for 2 weeks   Increase activity slowly as tolerated    Complete by:  As directed    Patient may shower    Complete by:  As directed    You may shower without a dressing once there is no drainage.  Do not wash over the wound.  If drainage remains, cover wound with plastic wrap and then shower.      Follow-up Information    Frederik Pear, MD Follow up in 2 week(s).   Specialty:  Orthopedic  Surgery Contact information: [place] [id number] [phone]            Signed: Theodosia Quay 04/13/2017, 7:53 AM

## 2017-04-13 NOTE — Care Management Note (Signed)
Case Management Note  Patient Details  Name: [name] MRN: [id number] Date of Birth: [birth year]  Subjective/Objective:  71 yr old gentleman s/p left total knee revision.                Action/Plan: Case manager spoke with patient concerning discharge plan and DME needs. He has politely declined Home Health, says that he has had 8 surgeries on his hip and knows exactly what to do. Patient has RW, 3in1 and a wheelchair, also will be cared for by his husband. Patient says he is comfortable without therapy, knows all the exercises.    Expected Discharge Date:  04/13/17               Expected Discharge Plan:  Home/Self Care  In-House Referral:  NA  Discharge planning Services  CM Consult  Post Acute Care Choice:  NA Choice offered to:  Patient  DME Arranged:   (has RW,3n1,and wheelchair ) DME Agency:  NA  HH Arranged:  Patient Refused [place] Agency:     Status of Service:  Completed, signed off  If discussed at [employer] of Stay Meetings, dates discussed:    Additional Comments:  Ninfa Meeker, RN 04/13/2017, 10:48 AM

## 2017-04-13 NOTE — Progress Notes (Signed)
Discharged to home; verbalized understanding of written and verbal discharge instructions. Prescriptions given for new medications. Discussed follow up appointments, wound care and activity. Left unit via wheelchair, accompanied by spouse and nursing assistant transport

## 2017-04-13 NOTE — Progress Notes (Signed)
Physical Therapy Treatment Patient Details Name: [name] MRN: [id number] DOB: Oct 26, 1945 Today's Date: 04/13/2017    History of Present Illness Patient is a 71 y/o male s/p TOTAL KNEE REVISION PATELLA and TIBIA (Left), PMH positive for R THR w/ multiple revisions, bilat wrist surg and B TKR, PAF, CAD, COPD, HTN and renal calculi.    PT Comments    Patient progressing with ambulation, but still more unsafe when fatigued reqiuring cues for posture and proximity to walker.  Time spent to review all safety techniques, assist needed as well as HEP.  Patient does not feel he needs follow up PT.  Feel due to ABLA pt still fatigues with ambulation and needs rest breaks for improved safety and decreased fall risk.  He is aware.  Agree with home with spouse assist and no initial follow up PT.    Follow Up Recommendations  DC plan and follow up therapy as arranged by surgeon;Supervision/Assistance - 24 hour     Equipment Recommendations  None recommended by PT    Recommendations for Other Services       Precautions / Restrictions Precautions Precautions: Fall;Knee Precaution Comments: Reviewed resting with knee in extension Restrictions Weight Bearing Restrictions: Yes LLE Weight Bearing: Weight bearing as tolerated    Mobility  Bed Mobility   Bed Mobility: Supine to Sit     Supine to sit: Supervision     General bed mobility comments: increased time, effort to scoot to EOB  Transfers Overall transfer level: Needs assistance Equipment used: Rolling walker (2 wheeled) Transfers: Sit to/from Stand Sit to Stand: Min assist         General transfer comment: initially up without assist from low bed, but then assist to bring COG over BOS  Ambulation/Gait Ambulation/Gait assistance: Min guard;Supervision Ambulation Distance (Feet): 120 Feet (x 2) Assistive device: Rolling walker (2 wheeled) Gait Pattern/deviations: Step-through pattern;Trunk flexed;Shuffle;Decreased  stride length     General Gait Details: cues for upright posture, proximity to walker, decreased speed for increased safety   Stairs Stairs: Yes   Stair Management: Two rails;Step to pattern;Forwards Number of Stairs: 2 General stair comments: assist for safety esp on turn to come back down stairs  Wheelchair Mobility    Modified Rankin (Stroke Patients Only)       Balance Overall balance assessment: Needs assistance   Sitting balance-Leahy Scale: Good     Standing balance support: Bilateral upper extremity supported Standing balance-Leahy Scale: Poor Standing balance comment: UE support for balance                            Cognition Arousal/Alertness: Awake/alert Behavior During Therapy: WFL for tasks assessed/performed Overall Cognitive Status: Within Functional Limits for tasks assessed                                        Exercises Total Joint Exercises Ankle Circles/Pumps: AROM;Both;10 reps;Supine Quad Sets: AROM;Left;10 reps;Supine Short Arc Quad: AROM;Left;10 reps;Supine Heel Slides: Left;10 reps;Seated;AROM Hip ABduction/ADduction: AROM;Left;10 reps;Supine Straight Leg Raises: AROM;Left;10 reps;Supine    General Comments General comments (skin integrity, edema, etc.): time to review HEP with handout, discuss issues with safety and need for assist for stairs as well as taking time during ambulation to prevent LOB and to sit if fatigued as fall risk increased with fatigue.  Verbalized understanding of all.  Pertinent Vitals/Pain Pain Assessment: Faces Faces Pain Scale: Hurts a little bit Pain Location: left knee Pain Descriptors / Indicators: Aching Pain Intervention(s): Monitored during session    Home Living                      Prior Function            PT Goals (current goals can now be found in the care plan section) Progress towards PT goals: Progressing toward goals    Frequency     7X/week      PT Plan Current plan remains appropriate    Co-evaluation              AM-PAC PT "6 Clicks" Daily Activity  Outcome Measure  Difficulty turning over in bed (including adjusting bedclothes, sheets and blankets)?: A Little Difficulty moving from lying on back to sitting on the side of the bed? : A Little Difficulty sitting down on and standing up from a chair with arms (e.g., wheelchair, bedside commode, etc,.)?: Total Help needed moving to and from a bed to chair (including a wheelchair)?: A Little Help needed walking in hospital room?: A Little Help needed climbing 3-5 steps with a railing? : A Little 6 Click Score: 16    End of Session Equipment Utilized During Treatment: Gait belt Activity Tolerance: Patient tolerated treatment well Patient left: in chair;with call bell/phone within reach   PT Visit Diagnosis: Other abnormalities of gait and mobility (R26.89)     Time: 8088-1103 PT Time Calculation (min) (ACUTE ONLY): 46 min  Charges:  $Gait Training: 8-22 mins $Therapeutic Exercise: 8-22 mins $Self Care/Home Management: 8-22                    G CodesMagda Kiel, [place] 04/13/2017    Reginia Naas 04/13/2017, 10:14 AM

## 2017-04-14 ENCOUNTER — Encounter (HOSPITAL_COMMUNITY): Payer: Self-pay | Admitting: Orthopedic Surgery

## 2017-04-16 LAB — AEROBIC/ANAEROBIC CULTURE W GRAM STAIN (SURGICAL/DEEP WOUND)

## 2017-04-16 LAB — AEROBIC/ANAEROBIC CULTURE (SURGICAL/DEEP WOUND): CULTURE: NO GROWTH

## 2017-04-17 ENCOUNTER — Emergency Department (HOSPITAL_COMMUNITY)
Admission: EM | Admit: 2017-04-17 | Discharge: 2017-04-17 | Disposition: A | Discharge status: Home / Self Care | Payer: Medicare Other | Attending: Emergency Medicine | Admitting: Emergency Medicine

## 2017-04-17 ENCOUNTER — Encounter (HOSPITAL_COMMUNITY): Payer: Self-pay | Admitting: Emergency Medicine

## 2017-04-17 DIAGNOSIS — Z79899 Other long term (current) drug therapy: Secondary | ICD-10-CM | POA: Diagnosis not present

## 2017-04-17 DIAGNOSIS — Y658 Other specified misadventures during surgical and medical care: Secondary | ICD-10-CM | POA: Insufficient documentation

## 2017-04-17 DIAGNOSIS — Z4889 Encounter for other specified surgical aftercare: Secondary | ICD-10-CM | POA: Diagnosis present

## 2017-04-17 DIAGNOSIS — Z87891 Personal history of nicotine dependence: Secondary | ICD-10-CM | POA: Diagnosis not present

## 2017-04-17 DIAGNOSIS — Z7901 Long term (current) use of anticoagulants: Secondary | ICD-10-CM | POA: Diagnosis not present

## 2017-04-17 DIAGNOSIS — Z96652 Presence of left artificial knee joint: Secondary | ICD-10-CM | POA: Insufficient documentation

## 2017-04-17 DIAGNOSIS — I10 Essential (primary) hypertension: Secondary | ICD-10-CM | POA: Insufficient documentation

## 2017-04-17 DIAGNOSIS — J449 Chronic obstructive pulmonary disease, unspecified: Secondary | ICD-10-CM | POA: Diagnosis not present

## 2017-04-17 DIAGNOSIS — E039 Hypothyroidism, unspecified: Secondary | ICD-10-CM | POA: Insufficient documentation

## 2017-04-17 DIAGNOSIS — I2510 Atherosclerotic heart disease of native coronary artery without angina pectoris: Secondary | ICD-10-CM | POA: Insufficient documentation

## 2017-04-17 DIAGNOSIS — T8189XA Other complications of procedures, not elsewhere classified, initial encounter: Secondary | ICD-10-CM | POA: Diagnosis not present

## 2017-04-17 DIAGNOSIS — Z96641 Presence of right artificial hip joint: Secondary | ICD-10-CM | POA: Diagnosis not present

## 2017-04-17 MED ORDER — BACITRACIN ZINC 500 UNIT/GM EX OINT
TOPICAL_OINTMENT | CUTANEOUS | Status: AC
  Filled 2017-04-17: qty 2.7

## 2017-04-17 NOTE — ED Notes (Signed)
Pt's knee is weeping some blood around an incision post surgery on 7/2 on his left patella

## 2017-04-17 NOTE — Discharge Instructions (Signed)
CONTINUE TO ELEVATE YOUR LEG AS OFTEN AS POSSIBLE. APPLY NEOSPORIN TO BOTTOM OF SCAR AND KEEP CLEAN AND DRY. YOU MAY NOTICE THE DRAINAGE INCREASE AFTER PHYSICAL THERAPY AND WALKING AROUND A LOT; THIS IS NORMAL. RETURN TO ER IF YOU NOTICE PUS-LIKE DRAINAGE, WORSENING REDNESS AROUND WOUND, WORSENING PAIN, FEVER, OR SEVERE BLEEDING.

## 2017-04-17 NOTE — ED Triage Notes (Signed)
Patient complaining of left knee pain. Patient states that the knee incision has been bleeding and came in because he was concern. Patient had knee surgery on Monday.

## 2017-04-17 NOTE — ED Provider Notes (Signed)
[facility] DEPT Provider Note   CSN: [id number] Arrival date & time: 04/17/17  2009     History   Chief Complaint Chief Complaint  Patient presents with  . Knee Problem    HPI [name] is a 71 y.o. male.  71yo M w/ extensive PMH including A fib on Xarelto who p/w post-operative bleeding from incision. Pt had L knee replacement by Dr. Mayer Camel on 7/2. He has been recovering appropriately. This afternoon after they left church, they noticed some bleeding from the bottom of the incision. He denies any trauma today. No change in postoperative pain, he states he has been recovering well. He denies any rapid bleeding. He did start Xarelto back after surgery. He has been walking around more today.    The history is provided by the patient.    Past Medical History:  Diagnosis Date  . A-fib (HCC)    hx.- sinus rhythm now  . Acid reflux   . Acute blood loss anemia   . Anxiety   . Arthritis   . BPH (benign prostatic hyperplasia)   . CAD (coronary artery disease)   . Depression   . Dislocation of internal right hip prosthesis, sequela   . Environmental allergies   . Fracture of wrist 10/23/13   LEFT  . High cholesterol   . History of kidney stones    surg. removal 02/2017  . HLD (hyperlipidemia)   . Hypertension   . Hypothyroidism    thyroid removed  . Left knee injury    cap dislocated  . Low testosterone, possible hypogonadism 10/01/2012  . Lumbago   . Metabolic bone disease   . Osteoporosis   . Plaque psoriasis   . Sleep apnea    cpap machine-settings 17  . Thrombocytopenia ([place])   . Thyroid disease    HX GRAVES DISEASE  . Transfusion history    s/p 12'13 hip surgery  . Unsteady gait   . Unsteady gait   . Vertebral compression fracture (HCC)    L1-wears brace    Patient Active Problem List   Diagnosis Date Noted  . S/P revision of total knee 04/11/2017  . Failed total knee, left, initial encounter ([place]) 04/09/2017  . Renal calculus, right 02/08/2017    . Protein-calorie malnutrition, severe ([place]) 12/08/2016  . Chronic obstructive pulmonary disease ([place]) 12/08/2016  . Staghorn renal calculus 11/05/2016  . Hyperlipidemia 03/22/2016  . Wound drainage right hip 11/15/2013  . Expected blood loss anemia 09/18/2013  . Hyponatremia 09/18/2013  . S/P right TH revision 09/17/2013  . Nonunion, fracture, Right tibia  07/03/2013  . UTI (urinary tract infection) 07/03/2013  . Pathological fracture due to osteoporosis with delayed healing 04/24/2013  . Osteoporosis with fracture 04/24/2013  . Rash and nonspecific skin eruption 03/08/2013  . Syncope 02/21/2013  . PAF (paroxysmal atrial fibrillation) ([place]) 02/21/2013  . Chronic anticoagulation 02/21/2013  . Fall 02/08/2013  . Physical deconditioning 02/08/2013  . Low back pain radiating to both legs 02/08/2013  . Compression fracture of L1 lumbar vertebra (HCC) 02/08/2013  . Right tibial fracture 02/05/2013  . Obesity, unspecified 02/05/2013  . Osteomyelitis of right wrist ([place]) 11/02/2012  . Anemia 10/06/2012  . HTN (hypertension) 10/03/2012  . Psoriasis 10/03/2012  . Dyslipidemia 10/03/2012  . Vitamin D deficiency 10/03/2012  . GERD (gastroesophageal reflux disease) 10/03/2012  . Hyperglycemia 10/03/2012  . Anxiety 10/03/2012  . Depression 10/03/2012  . CAD (coronary artery disease) 10/03/2012  . DJD (degenerative joint disease) 10/03/2012  .  Chronic gingivitis 10/03/2012  . Low testosterone, possible hypogonadism 10/01/2012  . Normocytic anemia 09/30/2012  . Right femoral fracture ([place]) 09/29/2012  . Right forearm fracture 09/29/2012  . Atrial fibrillation ([place]) 09/29/2012  . Hypothyroidism 09/29/2012  . History of recurrent UTIs 09/29/2012    Past Surgical History:  Procedure Laterality Date  . APPENDECTOMY    . CARDIAC CATHETERIZATION  2013  . CYSTOSCOPY/URETEROSCOPY/HOLMIUM LASER/STENT PLACEMENT Right 03/11/2017   Procedure: CYSTOSCOPY/URETEROSCOPYSTENT PLACEMENT right ureter  retrograde pylegram;  Surgeon: Cleon Gustin, MD;  Location: [facility];  Service: Urology;  Laterality: Right;  . HARDWARE REMOVAL  10/05/2012   Procedure: HARDWARE REMOVAL;  Surgeon: [name], MD;  Location: [facility];  Service: Orthopedics;  Laterality: Right;  REMOVING  STRYKER  GAMMA NAIL  . HARDWARE REMOVAL Right 07/03/2013   Procedure: HARDWARE REMOVAL RIGHT TIBIA ;  Surgeon: Rozanna Box, MD;  Location: [facility];  Service: Orthopedics;  Laterality: Right;  . HIP CLOSED REDUCTION Right 10/15/2013   Procedure: CLOSED MANIPULATION HIP;  Surgeon: [name], MD;  Location: [facility];  Service: Orthopedics;  Laterality: Right;  . hip revision     June 2017  . HOLMIUM LASER APPLICATION Right 06/18/9891   Procedure: HOLMIUM LASER APPLICATION;  Surgeon: Cleon Gustin, MD;  Location: [facility];  Service: Urology;  Laterality: Right;  . INCISION AND DRAINAGE HIP Right 11/16/2013   Procedure: IRRIGATION AND DEBRIDEMENT RIGHT HIP;  Surgeon: [name], MD;  Location: [facility];  Service: Orthopedics;  Laterality: Right;  . JOINT REPLACEMENT     hip-right x2  . KNEE SURGERY Bilateral 2012   Total knee replacements  . NEPHROLITHOTOMY Right 02/08/2017   Procedure: NEPHROLITHOTOMY PERCUTANEOUS WITH SURGEON ACCESS;  Surgeon: Cleon Gustin, MD;  Location: [facility];  Service: Urology;  Laterality: Right;  . ORIF TIBIA FRACTURE Right 02/06/2013   Procedure: OPEN REDUCTION INTERNAL FIXATION (ORIF) TIBIA FRACTURE WITH IM ROD FIBULA;  Surgeon: Rozanna Box, MD;  Location: [facility];  Service: Orthopedics;  Laterality: Right;  . ORIF TIBIA FRACTURE Right 07/03/2013   Procedure: RIGHT TIBIA NON UNION REPAIR ;  Surgeon: Rozanna Box, MD;  Location: [facility];  Service: Orthopedics;  Laterality: Right;  . ORIF WRIST FRACTURE  10/02/2012   Procedure: OPEN REDUCTION INTERNAL FIXATION (ORIF) WRIST FRACTURE;  Surgeon: Roseanne Kaufman, MD;  Location: [facility];  Service: Orthopedics;  Laterality: Right;  WITH    ANTIBIOTIC  CEMENT  . ORIF WRIST FRACTURE Left 10/28/2013   Procedure: OPEN REDUCTION INTERNAL FIXATION (ORIF) WRIST FRACTURE with allograft;  Surgeon: Roseanne Kaufman, MD;  Location: [facility];  Service: Orthopedics;  Laterality: Left;  DVR Plate  . right femur surgery  05/2012  . TOTAL HIP REVISION  10/05/2012   Procedure: TOTAL HIP REVISION;  Surgeon: [name], MD;  Location: [facility];  Service: Orthopedics;  Laterality: Right;  RIGHT TOTAL HIP REVISION  . TOTAL HIP REVISION Right 09/17/2013   Procedure: REVISION RIGHT TOTAL HIP ARTHROPLASTY ;  Surgeon: [name], MD;  Location: [facility];  Service: Orthopedics;  Laterality: Right;  . TOTAL HIP REVISION Right 10/26/2013   Procedure: REVISION RIGHT TOTAL HIP ARTHROPLASTY;  Surgeon: [name], MD;  Location: [facility];  Service: Orthopedics;  Laterality: Right;  . TOTAL HIP REVISION  03/2016  . TOTAL KNEE REVISION Left 04/11/2017   Procedure: TOTAL KNEE REVISION PATELLA and TIBIA;  Surgeon: Frederik Pear, MD;  Location: [facility];  Service: Orthopedics;  Laterality:  Left;  . WRIST FRACTURE SURGERY  05/2012       Home Medications    Prior to Admission medications   Medication Sig Start Date End Date Taking? Authorizing Provider  alfuzosin (UROXATRAL) 10 MG 24 hr tablet Take 10 mg by mouth at bedtime.    [provider]  amiodarone (PACERONE) 200 MG tablet Take 200 mg by mouth daily.    [provider]  amoxicillin (AMOXIL) 500 MG capsule Take 2,000 mg by mouth See admin instructions. Takes 4 capsules 1 hour prior to dental procedures    [provider]  atorvastatin (LIPITOR) 80 MG tablet Take 80 mg by mouth 2 (two) times a week. Monday & Friday evenings ONLY    [provider]  buPROPion (WELLBUTRIN XL) 300 MG 24 hr tablet Take 300 mg by mouth daily with breakfast.     [provider]  Calcium Citrate-Vitamin D (CALCIUM CITRATE + D PO) Take 1 tablet by mouth 2 (two) times daily.     [provider]  Cholecalciferol (VITAMIN D) 2000 units CAPS Take 2,000 Units by mouth 2 (two) times daily.    [provider]  clonazePAM (KLONOPIN) 1 MG tablet Take one tablet by mouth every 6 hours Patient taking differently: Take 1 mg by mouth every 6 (six) hours as needed for anxiety.  11/15/16   Reed, Tiffany L, DO  denosumab (PROLIA) 60 MG/ML SOLN injection Inject 60 mg into the skin every 6 (six) months. Administer in upper arm, thigh, or abdomen    [provider]  diltiazem (CARDIZEM CD) 240 MG 24 hr capsule Take 240 mg by mouth daily.  05/19/13   [provider]  doxycycline (VIBRA-TABS) 100 MG tablet Take 100 mg by mouth daily.    [provider]  DULoxetine (CYMBALTA) 60 MG capsule Take 60 mg by mouth 2 (two) times daily.     [provider]  EPINEPHrine (EPIPEN 2-PAK) 0.3 mg/0.3 mL IJ SOAJ injection Inject 0.3 mg into the muscle daily as needed (for anaphylactic reaction).    [provider]  etanercept (ENBREL) 50 MG/ML injection Inject 50 mg into the skin 2 (two) times a week. Tuesday & Friday    [provider]  Ferrous Gluconate (IRON 27 PO) Take 1 tablet by mouth 2 (two) times daily.     [provider]  levothyroxine (SYNTHROID, LEVOTHROID) 200 MCG tablet Take 200 mcg by mouth daily at 6 (six) AM. (0500)    [provider]  levothyroxine (SYNTHROID, LEVOTHROID) 25 MCG tablet Take 25 mcg by mouth daily before breakfast.    [provider]  loratadine (CLARITIN) 10 MG tablet Take 10 mg by mouth daily.     [provider]  Multiple Vitamin (MULTIVITAMIN WITH MINERALS) TABS tablet Take 1 tablet by mouth daily. Men's One-A-Day 50+    [provider]  nitrofurantoin (MACRODANTIN) 100 MG capsule Take 100 mg by mouth 2 (two) times daily. 03/17/17   [provider]  nitroGLYCERIN (NITROSTAT) 0.4 MG SL tablet Place 0.4 mg under the tongue every 5 (five) minutes as needed for chest  pain. x3 doses as needed for chest pain    [provider]  omeprazole (PRILOSEC) 40 MG capsule Take 40 mg by mouth daily before breakfast.     [provider]  oxyCODONE-acetaminophen (PERCOCET/ROXICET) 5-325 MG tablet Take 1-2 tablets by mouth every 4 (four) hours as needed for moderate pain. 04/11/17   Leighton Parody, PA-C  polyethylene glycol (MIRALAX / GLYCOLAX) packet Take 17 g by mouth daily as needed for mild constipation.     [provider]  Probiotic Product (PROBIOTIC PO) Take 1 capsule by mouth daily with breakfast.    [provider]  ranolazine (RANEXA) 500 MG 12 hr tablet Take 500 mg by mouth 2 (two) times daily.     [provider]  rivaroxaban (XARELTO) 20 MG TABS tablet Take 1 tablet (20 mg total) by mouth daily. 04/25/17   Leighton Parody, PA-C  sulfamethoxazole-trimethoprim (BACTRIM DS,SEPTRA DS) 800-160 MG tablet Take 1 tablet by mouth 2 (two) times daily. Patient not taking: Reported on 04/05/2017 03/11/17   Cleon Gustin, MD  testosterone cypionate (DEPOTESTOSTERONE CYPIONATE) 200 MG/ML injection Inject 200mg  (43ml) intramuscularly every 14 days Patient taking differently: Inject 200 mg into the muscle every 14 (fourteen) days. Inject 200mg  (53ml) intramuscularly every 14 days 03/30/16   Lauree Chandler, NP  tiZANidine (ZANAFLEX) 4 MG tablet Take 1 tablet (4 mg total) by mouth every 6 (six) hours as needed for muscle spasms. 04/11/17   Leighton Parody, PA-C  vitamin C (ASCORBIC ACID) 500 MG tablet Take 500 mg by mouth daily.    [provider]    Family History Family History  Problem Relation Age of Onset  . CAD Father 67  . Arthritis Mother     Social History Social History  Substance Use Topics  . Smoking status: Former Smoker    Quit date: 06/05/2012  . Smokeless tobacco: Never Used  . Alcohol use Yes     Comment: occasional-social     Allergies   Patient has no known allergies.   Review of  Systems Review of Systems All other systems reviewed and are negative except that which was mentioned in HPI   Physical Exam Updated Vital Signs BP (!) 144/79 (BP Location: Left Arm)   Pulse 90   Temp 98 F (36.7 C) (Oral)   Resp 18   Ht 6' (1.829 m)   Wt 131.1 kg (289 lb)   SpO2 99%   BMI 39.20 kg/m   Physical Exam  Constitutional: He is oriented to person, place, and time. He appears well-developed and well-nourished. No distress.  HENT:  Head: Normocephalic and atraumatic.  Eyes: Conjunctivae are normal.  Neck: Neck supple.  Cardiovascular: Intact distal pulses.   Musculoskeletal: He exhibits edema. He exhibits no tenderness.  Large incision over L knee with sutures in place, distal portion of wound with small amount of serosanguinous drainage, no surrounding erythema or tenderness; moderate edema of L knee down to ankle without tenderness  Neurological: He is alert and oriented to person, place, and time.  Skin: Skin is warm and dry.  Psychiatric: He has a normal mood and affect. Judgment normal.  Nursing note and vitals reviewed.    ED Treatments / Results  Labs (all labs ordered are listed, but only abnormal results are displayed) Labs Reviewed - No data to display  EKG  EKG Interpretation None       Radiology No results found.  Procedures Procedures (including critical care time)  Medications Ordered in ED Medications  bacitracin 500 UNIT/GM ointment (not administered)     Initial Impression / Assessment and Plan / ED Course  I have reviewed the triage vital signs and the nursing notes.      PT w/ small amount of Serous sanguinous drainage from his incision site after recent knee replacement. Incision was healing well, no evidence  of infection. He has been walking more today and he has also recently restarted the Xarelto and I feel both of these factors are likely contributing to the mild amount of drainage. I see no evidence of rapid bleeding  and I have discussed supportive measures including elevation as often as possible, mild compression dressing, and ice. Applied bacitracin and bandage to wound and reviewed return precautions. Patient voiced understanding and was discharged in satisfactory condition.   Final Clinical Impressions(s) / ED Diagnoses   Final diagnoses:  Draining postoperative wound, initial encounter    New Prescriptions New Prescriptions   No medications on file     Khole Branch, Wenda Overland, MD 04/17/17 [id number]

## 2017-05-17 DIAGNOSIS — Z9181 History of falling: Secondary | ICD-10-CM | POA: Insufficient documentation

## 2017-05-30 ENCOUNTER — Telehealth: Payer: Self-pay | Admitting: Internal Medicine

## 2017-05-30 NOTE — Telephone Encounter (Signed)
Left vm to call back to schedule  °

## 2017-05-30 NOTE — Telephone Encounter (Signed)
Ok Thx 

## 2017-05-30 NOTE — Telephone Encounter (Signed)
Spouse states they are a patient of Dr. Alain Marion.  Would like to know if Dr. Camila Li would take patient on?

## 2017-06-07 NOTE — Progress Notes (Signed)
Cardiology Office Note   Date:  06/08/2017   ID:  Nayan, Proch 1945-11-14, MRN [id number]  PCP:  Janie Morning, DO  Cardiologist:   Skeet Latch, MD   No chief complaint on file.   History of Present Illness: [name] is a 71 y.o. male with CAD, long-standing-persistent atrial fibrillation, hypertension, hyperlipidemia, sleep apnea on CPAP, hypothyroidism and prior tobacco abuse here to establish care. He presents today with his husband.  [name] was previously a patient of [name] and last saw him 01/2016.  At that time he was preparing for left hip surgery.  [name] recommended stress testing but he declined.  He underwent surgery with no complications. [name] reports that he was initially diagnosed with atrial fibrillation approximately 6 years ago. This occurred in the setting of multiple falls and syncope. He was started on diltiazem and amiodarone and has continued on these medicines since that time. He also has been on Xarelto for anticoagulation. The time of his atrial fibrillation diagnosis he also underwent a heart catheterization. He denies any chest pain at the time. He reportedly had 90% obstruction of an unknown vessel that had developed collaterals. He was started on Ranexa.  He had an echo 11/03/16 revealed LVEF of 60-65%. Severe left and right atrial enlargement was noted and the IVC was dilated.  He has saw his PCP, Dr. Janie Morning, on 03/25/17, at which time his levothyroxine was adjusted.  He was referred to me given that he has moved to [place]. His troponin has been feeling well and denies any chest pain. He also denies palpitations, lightheadedness, or dizziness.  He recently underwent surgery on his left knee. Dyspnea has been replaced and the kneecap became dislocated. Unfortunately, he will need another surgery on the knee soon.  He has swelling in his knee but otherwise no lower extremity edema. He also denies orthopnea or PND. He  uses his CPAP faithfully.  He quit smoking 5 years ago.     Past Medical History:  Diagnosis Date  . A-fib (HCC)    hx.- sinus rhythm now  . Acid reflux   . Acute blood loss anemia   . Anxiety   . Arthritis   . BPH (benign prostatic hyperplasia)   . CAD (coronary artery disease)   . Depression   . Dislocation of internal right hip prosthesis, sequela   . Environmental allergies   . Fracture of wrist 10/23/13   LEFT  . High cholesterol   . History of kidney stones    surg. removal 02/2017  . HLD (hyperlipidemia)   . Hypertension   . Hypothyroidism    thyroid removed  . Left knee injury    cap dislocated  . Low testosterone, possible hypogonadism 10/01/2012  . Lumbago   . Metabolic bone disease   . Osteoporosis   . Plaque psoriasis   . Sleep apnea    cpap machine-settings 17  . Thrombocytopenia ([place])   . Thyroid disease    HX GRAVES DISEASE  . Transfusion history    s/p 12'13 hip surgery  . Unsteady gait   . Unsteady gait   . Vertebral compression fracture (HCC)    L1-wears brace    Past Surgical History:  Procedure Laterality Date  . APPENDECTOMY    . CARDIAC CATHETERIZATION  2013  . CYSTOSCOPY/URETEROSCOPY/HOLMIUM LASER/STENT PLACEMENT Right 03/11/2017   Procedure: CYSTOSCOPY/URETEROSCOPYSTENT PLACEMENT right ureter retrograde pylegram;  Surgeon: Cleon Gustin, MD;  [facility];  Service: Urology;  Laterality: Right;  . HARDWARE REMOVAL  10/05/2012   Procedure: HARDWARE REMOVAL;  Surgeon: [name], MD;  [facility];  Service: Orthopedics;  Laterality: Right;  REMOVING  STRYKER  GAMMA NAIL  . HARDWARE REMOVAL Right 07/03/2013   Procedure: HARDWARE REMOVAL RIGHT TIBIA ;  Surgeon: Rozanna Box, MD;  Location: [place];  Service: Orthopedics;  Laterality: Right;  . HIP CLOSED REDUCTION Right 10/15/2013   Procedure: CLOSED MANIPULATION HIP;  Surgeon: [name], MD;  [facility];  Service: Orthopedics;  Laterality: Right;  . hip revision       June 2017  . HOLMIUM LASER APPLICATION Right 05/13/3824   Procedure: HOLMIUM LASER APPLICATION;  Surgeon: Cleon Gustin, MD;  [facility];  Service: Urology;  Laterality: Right;  . INCISION AND DRAINAGE HIP Right 11/16/2013   Procedure: IRRIGATION AND DEBRIDEMENT RIGHT HIP;  Surgeon: [name], MD;  [facility];  Service: Orthopedics;  Laterality: Right;  . JOINT REPLACEMENT     hip-right x2  . KNEE SURGERY Bilateral 2012   Total knee replacements  . NEPHROLITHOTOMY Right 02/08/2017   Procedure: NEPHROLITHOTOMY PERCUTANEOUS WITH SURGEON ACCESS;  Surgeon: Cleon Gustin, MD;  [facility];  Service: Urology;  Laterality: Right;  . ORIF TIBIA FRACTURE Right 02/06/2013   Procedure: OPEN REDUCTION INTERNAL FIXATION (ORIF) TIBIA FRACTURE WITH IM ROD FIBULA;  Surgeon: Rozanna Box, MD;  Location: [place];  Service: Orthopedics;  Laterality: Right;  . ORIF TIBIA FRACTURE Right 07/03/2013   Procedure: RIGHT TIBIA NON UNION REPAIR ;  Surgeon: Rozanna Box, MD;  Location: [place];  Service: Orthopedics;  Laterality: Right;  . ORIF WRIST FRACTURE  10/02/2012   Procedure: OPEN REDUCTION INTERNAL FIXATION (ORIF) WRIST FRACTURE;  Surgeon: Roseanne Kaufman, MD;  [facility];  Service: Orthopedics;  Laterality: Right;  WITH   ANTIBIOTIC  CEMENT  . ORIF WRIST FRACTURE Left 10/28/2013   Procedure: OPEN REDUCTION INTERNAL FIXATION (ORIF) WRIST FRACTURE with allograft;  Surgeon: Roseanne Kaufman, MD;  [facility];  Service: Orthopedics;  Laterality: Left;  DVR Plate  . right femur surgery  05/2012  . TOTAL HIP REVISION  10/05/2012   Procedure: TOTAL HIP REVISION;  Surgeon: [name], MD;  [facility];  Service: Orthopedics;  Laterality: Right;  RIGHT TOTAL HIP REVISION  . TOTAL HIP REVISION Right 09/17/2013   Procedure: REVISION RIGHT TOTAL HIP ARTHROPLASTY ;  Surgeon: [name], MD;  [facility];  Service: Orthopedics;  Laterality: Right;  . TOTAL HIP REVISION  Right 10/26/2013   Procedure: REVISION RIGHT TOTAL HIP ARTHROPLASTY;  Surgeon: [name], MD;  [facility];  Service: Orthopedics;  Laterality: Right;  . TOTAL HIP REVISION  03/2016  . TOTAL KNEE REVISION Left 04/11/2017   Procedure: TOTAL KNEE REVISION PATELLA and TIBIA;  Surgeon: Frederik Pear, MD;  Location: [place];  Service: Orthopedics;  Laterality: Left;  . WRIST FRACTURE SURGERY  05/2012     Current Outpatient Prescriptions  Medication Sig Dispense Refill  . alfuzosin (UROXATRAL) 10 MG 24 hr tablet Take 10 mg by mouth at bedtime.    [name] amoxicillin (AMOXIL) 500 MG capsule Take 2,000 mg by mouth See admin instructions. Takes 4 capsules 1 hour prior to dental procedures    . atorvastatin (LIPITOR) 80 MG tablet Take 80 mg by mouth 2 (two) times a week. Monday & Friday evenings ONLY    . buPROPion [facility]  XL) 300 MG 24 hr tablet Take 300 mg by mouth daily with breakfast.     . Calcium Citrate-Vitamin D (CALCIUM CITRATE + D PO) Take 1 tablet by mouth 2 (two) times daily.     . Cholecalciferol (VITAMIN D) 2000 units CAPS Take 2,000 Units by mouth 2 (two) times daily.    . clonazePAM (KLONOPIN) 1 MG tablet Take one tablet by mouth every 6 hours (Patient taking differently: Take 1 mg by mouth every 6 (six) hours as needed for anxiety. ) 120 tablet 0  . denosumab (PROLIA) 60 MG/ML SOLN injection Inject 60 mg into the skin every 6 (six) months. Administer in upper arm, thigh, or abdomen    . diltiazem (CARDIZEM CD) 240 MG 24 hr capsule Take 240 mg by mouth daily.     [name] doxycycline (VIBRA-TABS) 100 MG tablet Take 100 mg by mouth daily.    . DULoxetine (CYMBALTA) 60 MG capsule Take 60 mg by mouth 2 (two) times daily.     [name] EPINEPHrine (EPIPEN 2-PAK) 0.3 mg/0.3 mL IJ SOAJ injection Inject 0.3 mg into the muscle daily as needed (for anaphylactic reaction).    [name] etanercept (ENBREL) 50 MG/ML injection Inject 50 mg into the skin 2 (two) times a week. Tuesday & Friday    . Ferrous Gluconate (IRON  27 PO) Take 1 tablet by mouth 2 (two) times daily.     [name] levothyroxine (SYNTHROID, LEVOTHROID) 200 MCG tablet Take 200 mcg by mouth daily at 6 (six) AM. (0500)    . levothyroxine (SYNTHROID, LEVOTHROID) 25 MCG tablet Take 25 mcg by mouth daily before breakfast.    . loratadine (CLARITIN) 10 MG tablet Take 10 mg by mouth daily.     . Multiple Vitamin (MULTIVITAMIN WITH MINERALS) TABS tablet Take 1 tablet by mouth daily. Men's One-A-Day 49+    . nitrofurantoin (MACRODANTIN) 100 MG capsule Take 100 mg by mouth 2 (two) times daily.  0  . nitroGLYCERIN (NITROSTAT) 0.4 MG SL tablet Place 0.4 mg under the tongue every 5 (five) minutes as needed for chest pain. x3 doses as needed for chest pain    . omeprazole (PRILOSEC) 40 MG capsule Take 40 mg by mouth daily before breakfast.     . oxyCODONE-acetaminophen (PERCOCET/ROXICET) 5-325 MG tablet Take 1-2 tablets by mouth every 4 (four) hours as needed for moderate pain. 30 tablet 0  . polyethylene glycol (MIRALAX / GLYCOLAX) packet Take 17 g by mouth daily as needed for mild constipation.     . Probiotic Product (PROBIOTIC PO) Take 1 capsule by mouth daily with breakfast.    . rivaroxaban (XARELTO) 20 MG TABS tablet Take 1 tablet (20 mg total) by mouth daily. 30 tablet 0  . sulfamethoxazole-trimethoprim (BACTRIM DS,SEPTRA DS) 800-160 MG tablet Take 1 tablet by mouth 2 (two) times daily. (Patient not taking: Reported on 04/05/2017) 6 tablet 0  . testosterone cypionate (DEPOTESTOSTERONE CYPIONATE) 200 MG/ML injection Inject 200mg  (48ml) intramuscularly every 14 days (Patient taking differently: Inject 200 mg into the muscle every 14 (fourteen) days. Inject 200mg  (36ml) intramuscularly every 14 days) 10 mL 0  . tiZANidine (ZANAFLEX) 4 MG tablet Take 1 tablet (4 mg total) by mouth every 6 (six) hours as needed for muscle spasms. 60 tablet 0  . vitamin C (ASCORBIC ACID) 500 MG tablet Take 500 mg by mouth daily.     No current facility-administered medications for this  visit.     Allergies:   Patient has no known allergies.  Social History:  The patient  reports that he quit smoking about 5 years ago. He has never used smokeless tobacco. He reports that he drinks alcohol. He reports that he does not use drugs.   Family History:  The patient's family history includes Arthritis in his mother; Asthma in his father; CAD (age of onset: 89) in his father.    ROS:  Please see the history of present illness.   Otherwise, review of systems are positive for none.   All other systems are reviewed and negative.    PHYSICAL EXAM: VS:  BP 126/72   Pulse 71   Ht 6\' 4"  (1.93 m)   Wt 133.2 kg (293 lb 9.6 oz)   BMI 35.74 kg/m  , BMI Body mass index is 35.74 kg/m. GENERAL:  Well appearing HEENT:  Pupils equal round and reactive, fundi not visualized, oral mucosa unremarkable NECK:  No jugular venous distention, waveform within normal limits, carotid upstroke brisk and symmetric, no bruits, no thyromegaly LUNGS:  Clear to auscultation bilaterally HEART: Irregularly irregular.  PMI not displaced or sustained,S1 and S2 within normal limits, no S3, no S4, no clicks, no rubs, no murmurs ABD:  Flat, positive bowel sounds normal in frequency in pitch, no bruits, no rebound, no guarding, no midline pulsatile mass, no hepatomegaly, no splenomegaly EXT:  2 plus pulses throughout, no edema, no cyanosis no clubbing SKIN:  No rashes no nodules NEURO:  Cranial nerves II through XII grossly intact, motor grossly intact throughout PSYCH:  Cognitively intact, oriented to person place and time   EKG:  EKG is ordered today. The ekg ordered today demonstrates Atrial fibrillation. Rate 71 bpm. Left axis deviation. Nonspecific IVCD.   Echo 11/03/16: Study Conclusions  - Left ventricle: Systolic function was normal. The estimated   ejection fraction was in the range of 60% to 65%. The study is   not technically sufficient to allow evaluation of LV diastolic   function. - Left  atrium: Severely dilated. - Right atrium: Severely dilated.  Impressions:  - Technically difficult study with poor echo windows. The LVEF is   grossly normal at 60-65%, there is severe biatrial enlargment,   the IVC is dilated.  Recent Labs: 11/02/2016: ALT 40 12/11/2016: B Natriuretic Peptide 94.5 04/12/2017: BUN 23; Creatinine, Ser 1.42; Potassium 4.7; Sodium 134 04/13/2017: Hemoglobin 9.5; Platelets 188    Lipid Panel    Component Value Date/Time   CHOL 116 03/23/2016   TRIG 87 03/23/2016   HDL 40 03/23/2016   CHOLHDL 1.8 07/06/2013 1025   VLDL 10 07/06/2013 1025   LDLCALC 59 03/23/2016      Wt Readings from Last 3 Encounters:  06/08/17 133.2 kg (293 lb 9.6 oz)  04/17/17 131.1 kg (289 lb)  04/11/17 131.2 kg (289 lb 4.8 oz)      ASSESSMENT AND PLAN:  # Longstanding persistent atrial fibrillation: on review of [name]'s last EKGs with his PCP and with his prior cardiologist, he has been persistently in atrial fibrillation. He has no symptoms of palpitations. His heart rates are well-controlled. Given that it is not maintaining him in sinus rhythm and there are potential toxicities, we will stop the amiodarone. Continue diltiazem and Xarelto.    # CAD: # Hyperlipidemia: [name] reportedly has obstructive coronary artery disease with evidence of collateralization. He has never experienced any chest pain and this catheterization  Was reportedly performed for atrial fibrillation. Given that he has no history of angina, we will stop Ranexa.  Given  his CAD history ideally on a beta blocker rather than a calcium channel blocker. However he has been very stable and I would prefer not to make multiple changes at one time. He is not on aspirin given that he is on full dose anticoagulation. Continue atorvastatin and check lipids and a CMP today.  # Hypertension:  Blood pressure was initially elevated. However on repeat it was 126/72. Continue diltiazem. Current medicines are  reviewed at length with the patient today.  The patient does not have concerns regarding medicines.  # Morbid obesity:  Recommend diet and exercise.  He is limited regarding exercise due to recent knee operation.   # OSA: Continue CPAP The following changes have been made:  Stop amiodarone and Ranexa  Labs/ tests ordered today include:  No orders of the defined types were placed in this encounter.    Disposition:   FU with Joseph Henton C. Oval Linsey, MD, [facility] in 6 months.    This note was written with the assistance of speech recognition software.  Please excuse any transcriptional errors.  Signed, Beldon Nowling C. Oval Linsey, MD, [facility]  06/08/2017 1:00 PM    [place]

## 2017-06-08 ENCOUNTER — Ambulatory Visit (INDEPENDENT_AMBULATORY_CARE_PROVIDER_SITE_OTHER): Payer: Medicare Other | Admitting: Cardiovascular Disease

## 2017-06-08 ENCOUNTER — Encounter: Payer: Self-pay | Admitting: Cardiovascular Disease

## 2017-06-08 VITALS — BP 126/72 | HR 71 | Ht 76.0 in | Wt 293.6 lb

## 2017-06-08 DIAGNOSIS — E7800 Pure hypercholesterolemia, unspecified: Secondary | ICD-10-CM | POA: Diagnosis not present

## 2017-06-08 DIAGNOSIS — I481 Persistent atrial fibrillation: Secondary | ICD-10-CM | POA: Diagnosis not present

## 2017-06-08 DIAGNOSIS — I4819 Other persistent atrial fibrillation: Secondary | ICD-10-CM

## 2017-06-08 DIAGNOSIS — I259 Chronic ischemic heart disease, unspecified: Secondary | ICD-10-CM

## 2017-06-08 DIAGNOSIS — I119 Hypertensive heart disease without heart failure: Secondary | ICD-10-CM

## 2017-06-08 NOTE — Patient Instructions (Signed)
Medication Instructions:  STOP AMIODARONE  STOP RANEXA  Labwork: NONE  Testing/Procedures: NONE  Follow-Up: Your physician wants you to follow-up in: [place] will receive a reminder letter in the mail two months in advance. If you don't receive a letter, please call our office to schedule the follow-up appointment.  If you need a refill on your cardiac medications before your next appointment, please call your pharmacy.

## 2017-06-08 NOTE — Addendum Note (Signed)
Addended by: Alvina Filbert B on: 06/08/2017 05:02 PM   Modules accepted: Orders

## 2017-06-23 ENCOUNTER — Other Ambulatory Visit: Payer: Self-pay | Admitting: Orthopedic Surgery

## 2017-07-08 DIAGNOSIS — M2212 Recurrent subluxation of patella, left knee: Secondary | ICD-10-CM | POA: Diagnosis present

## 2017-07-08 NOTE — H&P (Signed)
[name] is an 71 y.o. male.   Chief Complaint: Left Knee pain and patella subluxation  HPI: [name] returns today with continued lateral subluxation of his patella when he flexes from 30 past 59.  To recap he had a revision left total knee arthroplasty with removal of a grossly loose patellar component that had delaminated off the back of the patella in March. A week after surgery he slipped and possibly tore the VMO off of the patellar tendon arthrotomy.  Since then he has had lateral subluxation of the patella.  It really does not hurt but it bothers him when he does flex past 30 because he can feel the clunk.  It is also visible to examination.  Since then the wound has healed and the postoperative effusion has gone away.  Fluid has been sent for Gram stain and culture.  They came back negative.  He had a great deal of reactive tissue at the time of his revision.  The last set of Gram stain and cultures from 04/28/17 came back with 700 cells.  Culture negative.  Past Medical History:  Diagnosis Date  . A-fib (HCC)    hx.- sinus rhythm now  . Acid reflux   . Acute blood loss anemia   . Anxiety   . Arthritis   . BPH (benign prostatic hyperplasia)   . CAD (coronary artery disease)   . Depression   . Dislocation of internal right hip prosthesis, sequela   . Environmental allergies   . Fracture of wrist 10/23/13   LEFT  . High cholesterol   . History of kidney stones    surg. removal 02/2017  . HLD (hyperlipidemia)   . Hypertension   . Hypothyroidism    thyroid removed  . Left knee injury    cap dislocated  . Low testosterone, possible hypogonadism 10/01/2012  . Lumbago   . Metabolic bone disease   . Osteoporosis   . Plaque psoriasis   . Sleep apnea    cpap machine-settings 17  . Thrombocytopenia ([place])   . Thyroid disease    HX GRAVES DISEASE  . Transfusion history    s/p 12'13 hip surgery  . Unsteady gait   . Unsteady gait   . Vertebral compression fracture  (HCC)    L1-wears brace    Past Surgical History:  Procedure Laterality Date  . APPENDECTOMY    . CARDIAC CATHETERIZATION  2013  . CYSTOSCOPY/URETEROSCOPY/HOLMIUM LASER/STENT PLACEMENT Right 03/11/2017   Procedure: CYSTOSCOPY/URETEROSCOPYSTENT PLACEMENT right ureter retrograde pylegram;  Surgeon: Cleon Gustin, MD;  [facility];  Service: Urology;  Laterality: Right;  . HARDWARE REMOVAL  10/05/2012   Procedure: HARDWARE REMOVAL;  Surgeon: [name], MD;  [facility];  Service: Orthopedics;  Laterality: Right;  REMOVING  STRYKER  GAMMA NAIL  . HARDWARE REMOVAL Right 07/03/2013   Procedure: HARDWARE REMOVAL RIGHT TIBIA ;  Surgeon: Rozanna Box, MD;  Location: [place];  Service: Orthopedics;  Laterality: Right;  . HIP CLOSED REDUCTION Right 10/15/2013   Procedure: CLOSED MANIPULATION HIP;  Surgeon: [name], MD;  [facility];  Service: Orthopedics;  Laterality: Right;  . hip revision     June 2017  . HOLMIUM LASER APPLICATION Right 05/11/1913   Procedure: HOLMIUM LASER APPLICATION;  Surgeon: Cleon Gustin, MD;  [facility];  Service: Urology;  Laterality: Right;  . INCISION AND DRAINAGE HIP Right 11/16/2013   Procedure: IRRIGATION AND DEBRIDEMENT RIGHT HIP;  Surgeon: Rodman Key  Marian Sorrow, MD;  [facility];  Service: Orthopedics;  Laterality: Right;  . JOINT REPLACEMENT     hip-right x2  . KNEE SURGERY Bilateral 2012   Total knee replacements  . NEPHROLITHOTOMY Right 02/08/2017   Procedure: NEPHROLITHOTOMY PERCUTANEOUS WITH SURGEON ACCESS;  Surgeon: Cleon Gustin, MD;  [facility];  Service: Urology;  Laterality: Right;  . ORIF TIBIA FRACTURE Right 02/06/2013   Procedure: OPEN REDUCTION INTERNAL FIXATION (ORIF) TIBIA FRACTURE WITH IM ROD FIBULA;  Surgeon: Rozanna Box, MD;  Location: [place];  Service: Orthopedics;  Laterality: Right;  . ORIF TIBIA FRACTURE Right 07/03/2013   Procedure: RIGHT TIBIA NON UNION REPAIR ;  Surgeon: Rozanna Box, MD;   Location: [place];  Service: Orthopedics;  Laterality: Right;  . ORIF WRIST FRACTURE  10/02/2012   Procedure: OPEN REDUCTION INTERNAL FIXATION (ORIF) WRIST FRACTURE;  Surgeon: Roseanne Kaufman, MD;  [facility];  Service: Orthopedics;  Laterality: Right;  WITH   ANTIBIOTIC  CEMENT  . ORIF WRIST FRACTURE Left 10/28/2013   Procedure: OPEN REDUCTION INTERNAL FIXATION (ORIF) WRIST FRACTURE with allograft;  Surgeon: Roseanne Kaufman, MD;  [facility];  Service: Orthopedics;  Laterality: Left;  DVR Plate  . right femur surgery  05/2012  . TOTAL HIP REVISION  10/05/2012   Procedure: TOTAL HIP REVISION;  Surgeon: [name], MD;  [facility];  Service: Orthopedics;  Laterality: Right;  RIGHT TOTAL HIP REVISION  . TOTAL HIP REVISION Right 09/17/2013   Procedure: REVISION RIGHT TOTAL HIP ARTHROPLASTY ;  Surgeon: [name], MD;  [facility];  Service: Orthopedics;  Laterality: Right;  . TOTAL HIP REVISION Right 10/26/2013   Procedure: REVISION RIGHT TOTAL HIP ARTHROPLASTY;  Surgeon: [name], MD;  [facility];  Service: Orthopedics;  Laterality: Right;  . TOTAL HIP REVISION  03/2016  . TOTAL KNEE REVISION Left 04/11/2017   Procedure: TOTAL KNEE REVISION PATELLA and TIBIA;  Surgeon: Frederik Pear, MD;  Location: [place];  Service: Orthopedics;  Laterality: Left;  . WRIST FRACTURE SURGERY  05/2012    Family History  Problem Relation Age of Onset  . CAD Father 75  . Asthma Father   . Arthritis Mother    Social History:  reports that he quit smoking about 5 years ago. He has never used smokeless tobacco. He reports that he drinks alcohol. He reports that he does not use drugs.  Allergies: No Known Allergies  No prescriptions prior to admission.    No results found for this or any previous visit (from the past 48 hour(s)). No results found.  Review of Systems  Constitutional: Positive for diaphoresis and malaise/fatigue.  HENT: Positive for hearing loss and tinnitus.    Eyes:       Cateracts  Respiratory: Positive for shortness of breath.   Cardiovascular: Positive for leg swelling.       Irregular heart beat  Gastrointestinal: Positive for constipation and vomiting.  Genitourinary: Positive for frequency and hematuria.       Kidney stones  Musculoskeletal: Positive for joint pain.  Skin: Negative.   Neurological: Positive for focal weakness.       Poor balance  Psychiatric/Behavioral: Positive for depression. The patient is nervous/anxious and has insomnia.     There were no vitals taken for this visit. Physical Exam  Constitutional: He is oriented to person, place, and time. He appears well-developed and well-nourished.  HENT:  Head: Normocephalic and atraumatic.  Eyes: Pupils are  equal, round, and reactive to light.  Neck: Normal range of motion. Neck supple.  Cardiovascular: Intact distal pulses.   Respiratory: Effort normal.  Musculoskeletal: He exhibits tenderness.  Patient has a 1+ effusion the skin is soft the wound is healed.  Range of motion is from 0-110.  The patella subluxes at 30-40 of flexion, and is reducible with manual pressure.  Collateral ligaments are stable.    Neurological: He is alert and oriented to person, place, and time.  Skin: Skin is warm and dry.  Psychiatric: He has a normal mood and affect. His behavior is normal. Judgment and thought content normal.     Assessment/Plan Assess: Status post-revision left total knee arthroplasty 04/11/17, avulsion of the VMO off of the arthrotomy repair.  A week later when he slipped and now subluxation of the patella when he flexes past 30 or 40.  X-rays are unremarkable.  Plan: Risks and benefits of repair of the VMO were discussed at length with the patient, we may or may not do a lateral release at the time of the VMO advancement.  I will see him back at the time of surgical intervention will have Depuy revision instruments on standby but not opened.  Mardene Lessig R,  PA-C 07/08/2017, 10:20 AM

## 2017-07-11 NOTE — Pre-Procedure Instructions (Signed)
KAMARE CASPERS  07/11/2017      [place], [address] [employer] [address] [place] [address] Phone: [phone] Fax: [phone]  [id number] [place], [place] [place] [address] Phone: [phone] Fax: [phone]  [facility] [place], [place] - [address] AT [facility] [address] [place] [address] Phone: [phone] Fax: [phone]    Your procedure is scheduled on Oct 5  Report to [place] at 530 A.M.  Call this number if you have problems the morning of surgery:  [phone]   Remember:  Do not eat food or drink liquids after midnight.  Take these medicines the morning of surgery with A SIP OF WATER Wellbutrin XL, Clonazepam (Klonopin) if needed, Diltiazem (cardizem), Levothyroxine (Synthroid), Loratadine (Claritin) , Nitrostat if needed, Omeprazole (Prilosec), Oxycodone (Percocet) if needed, Tizanidine (Zanaflex) if needed, Duloxetine (Cymbalta)  Stop/ take Xarelto as directed by your Dr.   Stop taking aspirin, BC's, Goody's, Herbal medications, Fish Oil, Ibuprofen, Advil, Motrin, Aleve   Do not wear jewelry, make-up or nail polish.  Do not wear lotions, powders, or perfumes, or deoderant.  Do not shave 48 hours prior to surgery.  Men may shave face and neck.  Do not bring valuables to the hospital.  [facility] is not responsible for any belongings or valuables.  Contacts, dentures or bridgework may not be worn into surgery.  Leave your suitcase in the car.  After surgery it may be brought to your room.  For patients admitted to the hospital, discharge time will be determined by your treatment team.  Patients discharged the day of surgery will not be allowed to drive home.    Special instructions:  [place] - Preparing for Surgery  Before  surgery, you can play an important role.  Because skin is not sterile, your skin needs to be as free of germs as possible.  You can reduce the number of germs on you skin by washing with CHG (chlorahexidine gluconate) soap before surgery.  CHG is an antiseptic cleaner which kills germs and bonds with the skin to continue killing germs even after washing.  Please DO NOT use if you have an allergy to CHG or antibacterial soaps.  If your skin becomes reddened/irritated stop using the CHG and inform your nurse when you arrive at Short Stay.  Do not shave (including legs and underarms) for at least 48 hours prior to the first CHG shower.  You may shave your face.  Please follow these instructions carefully:   1.  Shower with CHG Soap the night before surgery and the                                morning of Surgery.  2.  If you choose to wash your hair, wash your hair first as usual with your       normal shampoo.  3.  After you shampoo, rinse your hair and body thoroughly to remove the                      Shampoo.  4.  Use CHG as you would any other liquid soap.  You can apply chg directly       to the skin and wash  gently with scrungie or a clean washcloth.  5.  Apply the CHG Soap to your body ONLY FROM THE NECK DOWN.        Do not use on open wounds or open sores.  Avoid contact with your eyes,       ears, mouth and genitals (private parts).  Wash genitals (private parts)       with your normal soap.  6.  Wash thoroughly, paying special attention to the area where your surgery        will be performed.  7.  Thoroughly rinse your body with warm water from the neck down.  8.  DO NOT shower/wash with your normal soap after using and rinsing off       the CHG Soap.  9.  Pat yourself dry with a clean towel.            10.  Wear clean pajamas.            11.  Place clean sheets on your bed the night of your first shower and do not        sleep with pets.  Day of Surgery  Do not apply any  lotions/deoderants the morning of surgery.  Please wear clean clothes to the hospital/surgery center.     Please read over the following fact sheets that you were given. Pain Booklet, Coughing and Deep Breathing and Surgical Site Infection Prevention

## 2017-07-12 ENCOUNTER — Encounter (HOSPITAL_COMMUNITY): Payer: Self-pay

## 2017-07-12 ENCOUNTER — Encounter (HOSPITAL_COMMUNITY)
Admission: RE | Admit: 2017-07-12 | Discharge: 2017-07-12 | Disposition: A | Discharge status: Home / Self Care | Payer: Medicare Other | Source: Ambulatory Visit | Attending: Orthopedic Surgery | Admitting: Orthopedic Surgery

## 2017-07-12 DIAGNOSIS — M810 Age-related osteoporosis without current pathological fracture: Secondary | ICD-10-CM | POA: Diagnosis not present

## 2017-07-12 DIAGNOSIS — Z96653 Presence of artificial knee joint, bilateral: Secondary | ICD-10-CM | POA: Diagnosis not present

## 2017-07-12 DIAGNOSIS — I4891 Unspecified atrial fibrillation: Secondary | ICD-10-CM | POA: Diagnosis not present

## 2017-07-12 DIAGNOSIS — Z9889 Other specified postprocedural states: Secondary | ICD-10-CM | POA: Diagnosis not present

## 2017-07-12 DIAGNOSIS — L400 Psoriasis vulgaris: Secondary | ICD-10-CM | POA: Diagnosis not present

## 2017-07-12 DIAGNOSIS — S76112A Strain of left quadriceps muscle, fascia and tendon, initial encounter: Secondary | ICD-10-CM | POA: Diagnosis not present

## 2017-07-12 DIAGNOSIS — Z87891 Personal history of nicotine dependence: Secondary | ICD-10-CM | POA: Diagnosis not present

## 2017-07-12 DIAGNOSIS — Z87442 Personal history of urinary calculi: Secondary | ICD-10-CM | POA: Diagnosis not present

## 2017-07-12 DIAGNOSIS — E7800 Pure hypercholesterolemia, unspecified: Secondary | ICD-10-CM | POA: Diagnosis not present

## 2017-07-12 DIAGNOSIS — Z8249 Family history of ischemic heart disease and other diseases of the circulatory system: Secondary | ICD-10-CM | POA: Diagnosis not present

## 2017-07-12 DIAGNOSIS — Z96641 Presence of right artificial hip joint: Secondary | ICD-10-CM | POA: Diagnosis not present

## 2017-07-12 DIAGNOSIS — I2510 Atherosclerotic heart disease of native coronary artery without angina pectoris: Secondary | ICD-10-CM | POA: Diagnosis not present

## 2017-07-12 DIAGNOSIS — W010XXA Fall on same level from slipping, tripping and stumbling without subsequent striking against object, initial encounter: Secondary | ICD-10-CM | POA: Diagnosis not present

## 2017-07-12 DIAGNOSIS — E039 Hypothyroidism, unspecified: Secondary | ICD-10-CM | POA: Diagnosis not present

## 2017-07-12 DIAGNOSIS — I10 Essential (primary) hypertension: Secondary | ICD-10-CM | POA: Diagnosis not present

## 2017-07-12 DIAGNOSIS — G4730 Sleep apnea, unspecified: Secondary | ICD-10-CM | POA: Diagnosis not present

## 2017-07-12 DIAGNOSIS — F419 Anxiety disorder, unspecified: Secondary | ICD-10-CM | POA: Diagnosis not present

## 2017-07-12 DIAGNOSIS — F329 Major depressive disorder, single episode, unspecified: Secondary | ICD-10-CM | POA: Diagnosis not present

## 2017-07-12 DIAGNOSIS — Z79899 Other long term (current) drug therapy: Secondary | ICD-10-CM | POA: Diagnosis not present

## 2017-07-12 DIAGNOSIS — Z8261 Family history of arthritis: Secondary | ICD-10-CM | POA: Diagnosis not present

## 2017-07-12 DIAGNOSIS — K219 Gastro-esophageal reflux disease without esophagitis: Secondary | ICD-10-CM | POA: Diagnosis not present

## 2017-07-12 DIAGNOSIS — Z825 Family history of asthma and other chronic lower respiratory diseases: Secondary | ICD-10-CM | POA: Diagnosis not present

## 2017-07-12 HISTORY — DX: Cardiac arrhythmia, unspecified: I49.9

## 2017-07-12 LAB — BASIC METABOLIC PANEL
Anion gap: 6 (ref 5–15)
BUN: 18 mg/dL (ref 6–20)
CHLORIDE: 101 mmol/L (ref 101–111)
CO2: 28 mmol/L (ref 22–32)
CREATININE: 1.16 mg/dL (ref 0.61–1.24)
Calcium: 8.7 mg/dL — ABNORMAL LOW (ref 8.9–10.3)
GFR calc non Af Amer: >60 mL/min (ref >60)
Glucose, Bld: 136 mg/dL — ABNORMAL HIGH (ref 65–99)
POTASSIUM: 4.3 mmol/L (ref 3.5–5.1)
SODIUM: 135 mmol/L (ref 135–145)

## 2017-07-12 LAB — CBC
HEMATOCRIT: 45.4 % (ref 39.0–52.0)
Hemoglobin: 14.8 g/dL (ref 13.0–17.0)
MCH: 28.8 pg (ref 26.0–34.0)
MCHC: 32.6 g/dL (ref 30.0–36.0)
MCV: 88.3 fL (ref 78.0–100.0)
Platelets: 157 10*3/uL (ref 150–400)
RBC: 5.14 MIL/uL (ref 4.22–5.81)
RDW: 16.9 % — ABNORMAL HIGH (ref 11.5–15.5)
WBC: 6.1 10*3/uL (ref 4.0–10.5)

## 2017-07-12 LAB — PROTIME-INR
INR: 2.09
Prothrombin Time: 23.3 seconds — ABNORMAL HIGH (ref 11.4–15.2)

## 2017-07-12 NOTE — Progress Notes (Addendum)
PCp is Dr. Oletha Blend Cardiologist is Dr. Skeet Latch  Denies any chest pain, Fever, or cough.  Denies ever having a stress test.  Card cath noted form 2013 Echo noted form 2018 Instructed to bring cpap mask on the day of surgery.  Sandi Raveling called message left as to when to stop taking Xarelto.

## 2017-07-12 NOTE — Progress Notes (Signed)
Call recieved from Sandi Raveling at Dr [facility] about [place]. Last dose will be 07-11-17. Spoke with [name]( per [facility] request) [place] husband and voices understanding.

## 2017-07-13 NOTE — Progress Notes (Signed)
Anesthesia Chart Review:  Pt is a 71 year old male scheduled for repair L quadriceps tendon on 07/15/2017 with Frederik Pear, MD  - PCP was Janie Morning, DO (notes in care everywhere).  Will be a new patient for Lew Dawes, MD - Cardiologist is Skeet Latch, MD. Last office visit 06/08/17   PMH includes:  CAD (20% LAD, 100% RCA with good collateral flow 12/2011), atrial fibrillation, HTN, hyperlipidemia, OSA, history Graves' disease, hypothyroidism, thrombocytopenia, GERD. Former smoker. S/p L knee revision patella & tibia 04/11/17  Medications include: Lipitor, denosumab, diltiazem, doxycycline, Enbrel, iron, levothyroxine, Macrodantin, Prilosec, xarelto.  Last dose xarelto 07/11/17.   Preoperative labs reviewed.   - PT 23.3; will recheck DOS.   CXR 12/11/16: No active cardiopulmonary disease.  CT angio chest 12/11/16:  1. Technically adequate exam showing no acute pulmonary embolus. 2. Three-vessel coronary artery disease. 3. Mild emphysematous changes in the upper lobes. 4. Remote rib fractures. Probably chronic superior endplate fracture of T6.  EKG 06/08/17: Atrial fibrillation (71 bpm). LAD. Non-specific intra-ventricular block.   Echo 11/03/16:  - Left ventricle: Systolic function was normal. The estimatedejection fraction was in the range of 60% to 65%. The study isnot technically sufficient to allow evaluation of LV diastolicfunction. - Left atrium: Severely dilated. - Right atrium: Severely dilated. - Impressions: Technically difficult study with poor echo windows. The LVEF isgrossly normal at 60-65%, there is severe biatrial enlargment,the IVC is dilated  Carotid duplex 02/21/13: No significant extracranial carotid artery stenosis demonstrated. Vertebrals are patent with antegrade flow.  Cardiac cath 12/31/11 ([facility], found in correspondence 07/07/13 in media tab): 1. LAD: Mid 20% stenosis 2. RCA mid 100% stenosis. Moderate collateral flow from proximal RCA to  distal RCA. Good collateral flow from distal LAD to RPDA. Good collateral flow from the second OM to the distal RCA.  If no changes, I anticipate pt can proceed with surgery as scheduled.   Willeen Cass, FNP-BC [facility]/Anesthesiology Phone: [phone] 07/13/2017 11:32 AM

## 2017-07-13 NOTE — Addendum Note (Signed)
Addendum  created 07/13/17 1234 by Carvel Getting, NP   Sign clinical note

## 2017-07-14 MED ORDER — DEXTROSE 5 % IV SOLN
3.0000 g | INTRAVENOUS | Status: AC
  Administered 2017-07-15: 3 g via INTRAVENOUS
  Filled 2017-07-14: qty 3000

## 2017-07-14 MED ORDER — BUPIVACAINE LIPOSOME 1.3 % IJ SUSP
20.0000 mL | INTRAMUSCULAR | Status: AC
  Administered 2017-07-15: 20 mL
  Filled 2017-07-14: qty 20

## 2017-07-14 MED ORDER — SODIUM CHLORIDE 0.9 % IV SOLN
1000.0000 mg | INTRAVENOUS | Status: AC
  Administered 2017-07-15: 1000 mg via INTRAVENOUS
  Filled 2017-07-14: qty 1100

## 2017-07-14 NOTE — Anesthesia Preprocedure Evaluation (Addendum)
Anesthesia Evaluation  Patient identified by MRN, date of birth, ID band Patient awake    Reviewed: Allergy & Precautions, H&P , NPO status , Patient's Chart, lab work & pertinent test results  History of Anesthesia Complications (+) history of anesthetic complications  Airway Mallampati: II  TM Distance: >3 FB Neck ROM: full    Dental no notable dental hx.    Pulmonary sleep apnea , COPD,  COPD inhaler, former smoker,    Pulmonary exam normal breath sounds clear to auscultation       Cardiovascular Exercise Tolerance: Good hypertension, + CAD   Rhythm:regular Rate:Normal     Neuro/Psych PSYCHIATRIC DISORDERS    GI/Hepatic GERD  Medicated and Controlled,  Endo/Other  Hypothyroidism   Renal/GU      Musculoskeletal   Abdominal   Peds  Hematology   Anesthesia Other Findings   Reproductive/Obstetrics                            Anesthesia Physical Anesthesia Plan  ASA: III  Anesthesia Plan: Spinal   Post-op Pain Management:    Induction:   PONV Risk Score and Plan: 1 and Ondansetron, Dexamethasone and Treatment may vary due to age or medical condition  Airway Management Planned:   Additional Equipment:   Intra-op Plan:   Post-operative Plan:   Informed Consent: I have reviewed the patients History and Physical, chart, labs and discussed the procedure including the risks, benefits and alternatives for the proposed anesthesia with the patient or authorized representative who has indicated his/her understanding and acceptance.   Dental Advisory Given  Plan Discussed with: Anesthesiologist, CRNA and Surgeon  Anesthesia Plan Comments: (  )       Anesthesia Quick Evaluation

## 2017-07-15 ENCOUNTER — Ambulatory Visit (HOSPITAL_COMMUNITY): Payer: Medicare Other | Admitting: Emergency Medicine

## 2017-07-15 ENCOUNTER — Ambulatory Visit (HOSPITAL_COMMUNITY)
Admission: RE | Admit: 2017-07-15 | Discharge: 2017-07-16 | Disposition: A | Discharge status: Home / Self Care | Payer: Medicare Other | Source: Ambulatory Visit | Attending: Orthopedic Surgery | Admitting: Orthopedic Surgery

## 2017-07-15 ENCOUNTER — Encounter (HOSPITAL_COMMUNITY): Payer: Self-pay | Admitting: Anesthesiology

## 2017-07-15 ENCOUNTER — Encounter (HOSPITAL_COMMUNITY)
Admission: RE | Disposition: A | Discharge status: Home / Self Care | Payer: Self-pay | Source: Ambulatory Visit | Attending: Orthopedic Surgery

## 2017-07-15 ENCOUNTER — Ambulatory Visit (HOSPITAL_COMMUNITY): Payer: Medicare Other | Admitting: Anesthesiology

## 2017-07-15 DIAGNOSIS — F329 Major depressive disorder, single episode, unspecified: Secondary | ICD-10-CM | POA: Insufficient documentation

## 2017-07-15 DIAGNOSIS — Z8249 Family history of ischemic heart disease and other diseases of the circulatory system: Secondary | ICD-10-CM | POA: Insufficient documentation

## 2017-07-15 DIAGNOSIS — Z87891 Personal history of nicotine dependence: Secondary | ICD-10-CM | POA: Insufficient documentation

## 2017-07-15 DIAGNOSIS — E039 Hypothyroidism, unspecified: Secondary | ICD-10-CM | POA: Insufficient documentation

## 2017-07-15 DIAGNOSIS — S76112A Strain of left quadriceps muscle, fascia and tendon, initial encounter: Secondary | ICD-10-CM | POA: Diagnosis not present

## 2017-07-15 DIAGNOSIS — Z8261 Family history of arthritis: Secondary | ICD-10-CM | POA: Insufficient documentation

## 2017-07-15 DIAGNOSIS — Z96641 Presence of right artificial hip joint: Secondary | ICD-10-CM | POA: Insufficient documentation

## 2017-07-15 DIAGNOSIS — Z87442 Personal history of urinary calculi: Secondary | ICD-10-CM | POA: Insufficient documentation

## 2017-07-15 DIAGNOSIS — Z9889 Other specified postprocedural states: Secondary | ICD-10-CM | POA: Insufficient documentation

## 2017-07-15 DIAGNOSIS — M810 Age-related osteoporosis without current pathological fracture: Secondary | ICD-10-CM | POA: Insufficient documentation

## 2017-07-15 DIAGNOSIS — I10 Essential (primary) hypertension: Secondary | ICD-10-CM | POA: Diagnosis not present

## 2017-07-15 DIAGNOSIS — K219 Gastro-esophageal reflux disease without esophagitis: Secondary | ICD-10-CM | POA: Insufficient documentation

## 2017-07-15 DIAGNOSIS — I4891 Unspecified atrial fibrillation: Secondary | ICD-10-CM | POA: Insufficient documentation

## 2017-07-15 DIAGNOSIS — L400 Psoriasis vulgaris: Secondary | ICD-10-CM | POA: Insufficient documentation

## 2017-07-15 DIAGNOSIS — M2212 Recurrent subluxation of patella, left knee: Secondary | ICD-10-CM | POA: Diagnosis present

## 2017-07-15 DIAGNOSIS — G4730 Sleep apnea, unspecified: Secondary | ICD-10-CM | POA: Diagnosis not present

## 2017-07-15 DIAGNOSIS — I2510 Atherosclerotic heart disease of native coronary artery without angina pectoris: Secondary | ICD-10-CM | POA: Insufficient documentation

## 2017-07-15 DIAGNOSIS — W010XXA Fall on same level from slipping, tripping and stumbling without subsequent striking against object, initial encounter: Secondary | ICD-10-CM | POA: Insufficient documentation

## 2017-07-15 DIAGNOSIS — M2202 Recurrent dislocation of patella, left knee: Secondary | ICD-10-CM | POA: Diagnosis present

## 2017-07-15 DIAGNOSIS — F419 Anxiety disorder, unspecified: Secondary | ICD-10-CM | POA: Insufficient documentation

## 2017-07-15 DIAGNOSIS — Z825 Family history of asthma and other chronic lower respiratory diseases: Secondary | ICD-10-CM | POA: Insufficient documentation

## 2017-07-15 DIAGNOSIS — Z96653 Presence of artificial knee joint, bilateral: Secondary | ICD-10-CM | POA: Insufficient documentation

## 2017-07-15 DIAGNOSIS — Z79899 Other long term (current) drug therapy: Secondary | ICD-10-CM | POA: Insufficient documentation

## 2017-07-15 DIAGNOSIS — E7800 Pure hypercholesterolemia, unspecified: Secondary | ICD-10-CM | POA: Insufficient documentation

## 2017-07-15 HISTORY — PX: QUADRICEPS TENDON REPAIR: SHX756

## 2017-07-15 LAB — PROTIME-INR
INR: 1.1
PROTHROMBIN TIME: 14.1 s (ref 11.4–15.2)

## 2017-07-15 SURGERY — REPAIR, TENDON, QUADRICEPS
Anesthesia: Spinal | Site: Knee | Laterality: Left

## 2017-07-15 MED ORDER — BUPIVACAINE-EPINEPHRINE 0.25% -1:200000 IJ SOLN
INTRAMUSCULAR | Status: DC | PRN
  Administered 2017-07-15: 30 mL

## 2017-07-15 MED ORDER — POLYETHYLENE GLYCOL 3350 17 G PO PACK: 17.0000 g | PACK | Freq: Every day | ORAL | Status: DC | PRN

## 2017-07-15 MED ORDER — PROPOFOL 10 MG/ML IV BOLUS
INTRAVENOUS | Status: DC | PRN
  Administered 2017-07-15: 20 mg via INTRAVENOUS

## 2017-07-15 MED ORDER — FENTANYL CITRATE (PF) 100 MCG/2ML IJ SOLN
INTRAMUSCULAR | Status: AC
  Administered 2017-07-15: 50 ug via INTRAVENOUS
  Filled 2017-07-15: qty 2

## 2017-07-15 MED ORDER — PROPOFOL 10 MG/ML IV BOLUS
INTRAVENOUS | Status: AC
  Filled 2017-07-15: qty 20

## 2017-07-15 MED ORDER — ONDANSETRON HCL 4 MG PO TABS: 4.0000 mg | ORAL_TABLET | Freq: Four times a day (QID) | ORAL | Status: DC | PRN

## 2017-07-15 MED ORDER — DILTIAZEM HCL ER COATED BEADS 240 MG PO CP24
240.0000 mg | ORAL_CAPSULE | Freq: Every day | ORAL | Status: DC
  Administered 2017-07-16: 240 mg via ORAL
  Filled 2017-07-15: qty 1

## 2017-07-15 MED ORDER — PROPOFOL 500 MG/50ML IV EMUL
INTRAVENOUS | Status: DC | PRN
  Administered 2017-07-15: 70 ug/kg/min via INTRAVENOUS
  Administered 2017-07-15: 100 ug/kg/min via INTRAVENOUS

## 2017-07-15 MED ORDER — SODIUM CHLORIDE 0.9 % IJ SOLN
INTRAMUSCULAR | Status: DC | PRN
  Administered 2017-07-15: 50 mL

## 2017-07-15 MED ORDER — METOCLOPRAMIDE HCL 5 MG PO TABS: 5.0000 mg | ORAL_TABLET | Freq: Three times a day (TID) | ORAL | Status: DC | PRN

## 2017-07-15 MED ORDER — MIDAZOLAM HCL 2 MG/2ML IJ SOLN
INTRAMUSCULAR | Status: AC
  Filled 2017-07-15: qty 2

## 2017-07-15 MED ORDER — LACTATED RINGERS IV SOLN: INTRAVENOUS | Status: DC

## 2017-07-15 MED ORDER — PHENYLEPHRINE 40 MCG/ML (10ML) SYRINGE FOR IV PUSH (FOR BLOOD PRESSURE SUPPORT)
PREFILLED_SYRINGE | INTRAVENOUS | Status: AC
  Filled 2017-07-15: qty 10

## 2017-07-15 MED ORDER — EPHEDRINE SULFATE 50 MG/ML IJ SOLN
INTRAMUSCULAR | Status: DC | PRN
  Administered 2017-07-15 (×2): 10 mg via INTRAVENOUS

## 2017-07-15 MED ORDER — OXYCODONE HCL 5 MG PO TABS
ORAL_TABLET | ORAL | Status: AC
  Filled 2017-07-15: qty 2

## 2017-07-15 MED ORDER — CLONAZEPAM 1 MG PO TABS
1.0000 mg | ORAL_TABLET | Freq: Four times a day (QID) | ORAL | Status: DC | PRN
  Administered 2017-07-15 – 2017-07-16 (×4): 1 mg via ORAL
  Filled 2017-07-15 (×4): qty 1

## 2017-07-15 MED ORDER — CHLORHEXIDINE GLUCONATE 4 % EX LIQD: 60.0000 mL | Freq: Once | CUTANEOUS | Status: DC

## 2017-07-15 MED ORDER — FENTANYL CITRATE (PF) 100 MCG/2ML IJ SOLN: 25.0000 ug | INTRAMUSCULAR | Status: DC | PRN

## 2017-07-15 MED ORDER — RIVAROXABAN 10 MG PO TABS: 10.0000 mg | ORAL_TABLET | Freq: Every day | ORAL | 0 refills | Status: DC

## 2017-07-15 MED ORDER — PANTOPRAZOLE SODIUM 40 MG PO TBEC
80.0000 mg | DELAYED_RELEASE_TABLET | Freq: Every day | ORAL | Status: DC
  Administered 2017-07-16: 80 mg via ORAL
  Filled 2017-07-15: qty 2

## 2017-07-15 MED ORDER — HYDROMORPHONE HCL 1 MG/ML IJ SOLN
0.5000 mg | INTRAMUSCULAR | Status: DC | PRN
  Administered 2017-07-15: 1 mg via INTRAVENOUS
  Filled 2017-07-15: qty 1

## 2017-07-15 MED ORDER — FENTANYL CITRATE (PF) 100 MCG/2ML IJ SOLN
INTRAMUSCULAR | Status: DC | PRN
  Administered 2017-07-15: 25 ug via INTRAVENOUS
  Administered 2017-07-15: 50 ug via INTRAVENOUS

## 2017-07-15 MED ORDER — MAGNESIUM HYDROXIDE 400 MG/5ML PO SUSP
30.0000 mL | Freq: Every day | ORAL | Status: DC | PRN
Start: 2017-07-15 — End: 2017-07-16

## 2017-07-15 MED ORDER — EPHEDRINE 5 MG/ML INJ
INTRAVENOUS | Status: AC
  Filled 2017-07-15: qty 10

## 2017-07-15 MED ORDER — NITROFURANTOIN MACROCRYSTAL 100 MG PO CAPS
100.0000 mg | ORAL_CAPSULE | Freq: Two times a day (BID) | ORAL | Status: DC
  Administered 2017-07-15 – 2017-07-16 (×2): 100 mg via ORAL
  Filled 2017-07-15 (×2): qty 1

## 2017-07-15 MED ORDER — BUPROPION HCL ER (XL) 150 MG PO TB24: 300.0000 mg | ORAL_TABLET | Freq: Every day | ORAL | Status: DC

## 2017-07-15 MED ORDER — TIZANIDINE HCL 4 MG PO TABS: 4.0000 mg | ORAL_TABLET | Freq: Four times a day (QID) | ORAL | 0 refills | Status: DC | PRN

## 2017-07-15 MED ORDER — RIVAROXABAN 20 MG PO TABS
20.0000 mg | ORAL_TABLET | Freq: Every day | ORAL | Status: DC
  Administered 2017-07-16: 20 mg via ORAL
  Filled 2017-07-15: qty 1

## 2017-07-15 MED ORDER — DOCUSATE SODIUM 100 MG PO CAPS
100.0000 mg | ORAL_CAPSULE | Freq: Two times a day (BID) | ORAL | Status: DC
Start: 2017-07-15 — End: 2017-07-16
  Administered 2017-07-15 – 2017-07-16 (×2): 100 mg via ORAL
  Filled 2017-07-15 (×2): qty 1

## 2017-07-15 MED ORDER — PHENYLEPHRINE HCL 10 MG/ML IJ SOLN
INTRAMUSCULAR | Status: DC | PRN
  Administered 2017-07-15: 80 ug via INTRAVENOUS

## 2017-07-15 MED ORDER — FENTANYL CITRATE (PF) 100 MCG/2ML IJ SOLN
25.0000 ug | INTRAMUSCULAR | Status: DC | PRN
  Administered 2017-07-15 (×3): 50 ug via INTRAVENOUS

## 2017-07-15 MED ORDER — ONDANSETRON HCL 4 MG/2ML IJ SOLN: 4.0000 mg | Freq: Once | INTRAMUSCULAR | Status: DC | PRN

## 2017-07-15 MED ORDER — OXYCODONE-ACETAMINOPHEN 5-325 MG PO TABS
1.0000 | ORAL_TABLET | ORAL | Status: DC | PRN
Start: 2017-07-15 — End: 2017-07-16
  Administered 2017-07-16 (×2): 2 via ORAL
  Filled 2017-07-15 (×2): qty 2

## 2017-07-15 MED ORDER — ONDANSETRON HCL 4 MG/2ML IJ SOLN: 4.0000 mg | Freq: Four times a day (QID) | INTRAMUSCULAR | Status: DC | PRN

## 2017-07-15 MED ORDER — METHOCARBAMOL 500 MG PO TABS
ORAL_TABLET | ORAL | Status: AC
  Filled 2017-07-15: qty 1

## 2017-07-15 MED ORDER — METHOCARBAMOL 1000 MG/10ML IJ SOLN
500.0000 mg | Freq: Four times a day (QID) | INTRAMUSCULAR | Status: DC | PRN
  Filled 2017-07-15: qty 5

## 2017-07-15 MED ORDER — OXYCODONE-ACETAMINOPHEN 5-325 MG PO TABS: 1.0000 | ORAL_TABLET | ORAL | 0 refills | Status: DC | PRN

## 2017-07-15 MED ORDER — BISACODYL 5 MG PO TBEC: 5.0000 mg | DELAYED_RELEASE_TABLET | Freq: Every day | ORAL | Status: DC | PRN

## 2017-07-15 MED ORDER — LEVOTHYROXINE SODIUM 25 MCG PO TABS
25.0000 ug | ORAL_TABLET | Freq: Every day | ORAL | Status: DC
  Administered 2017-07-16: 25 ug via ORAL
  Filled 2017-07-15: qty 1

## 2017-07-15 MED ORDER — 0.9 % SODIUM CHLORIDE (POUR BTL) OPTIME
TOPICAL | Status: DC | PRN
  Administered 2017-07-15: 1000 mL

## 2017-07-15 MED ORDER — DULOXETINE HCL 60 MG PO CPEP
60.0000 mg | ORAL_CAPSULE | Freq: Two times a day (BID) | ORAL | Status: DC
  Administered 2017-07-15 – 2017-07-16 (×2): 60 mg via ORAL
  Filled 2017-07-15 (×2): qty 1

## 2017-07-15 MED ORDER — PHENYLEPHRINE HCL 10 MG/ML IJ SOLN
INTRAMUSCULAR | Status: DC | PRN
  Administered 2017-07-15: 50 ug/min via INTRAVENOUS

## 2017-07-15 MED ORDER — LEVOTHYROXINE SODIUM 100 MCG PO TABS
200.0000 ug | ORAL_TABLET | Freq: Every day | ORAL | Status: DC
  Administered 2017-07-16: 200 ug via ORAL
  Filled 2017-07-15: qty 2

## 2017-07-15 MED ORDER — TIZANIDINE HCL 4 MG PO TABS: 4.0000 mg | ORAL_TABLET | Freq: Four times a day (QID) | ORAL | Status: DC | PRN

## 2017-07-15 MED ORDER — LACTATED RINGERS IV SOLN
INTRAVENOUS | Status: DC | PRN
  Administered 2017-07-15 (×2): via INTRAVENOUS

## 2017-07-15 MED ORDER — MIDAZOLAM HCL 5 MG/5ML IJ SOLN
INTRAMUSCULAR | Status: DC | PRN
  Administered 2017-07-15: 2 mg via INTRAVENOUS

## 2017-07-15 MED ORDER — BUPIVACAINE-EPINEPHRINE (PF) 0.25% -1:200000 IJ SOLN
INTRAMUSCULAR | Status: AC
  Filled 2017-07-15: qty 30

## 2017-07-15 MED ORDER — FENTANYL CITRATE (PF) 100 MCG/2ML IJ SOLN
INTRAMUSCULAR | Status: AC
  Filled 2017-07-15: qty 2

## 2017-07-15 MED ORDER — FENTANYL CITRATE (PF) 250 MCG/5ML IJ SOLN
INTRAMUSCULAR | Status: AC
  Filled 2017-07-15: qty 5

## 2017-07-15 MED ORDER — RIVAROXABAN 10 MG PO TABS: 10.0000 mg | ORAL_TABLET | Freq: Every day | ORAL | Status: DC

## 2017-07-15 MED ORDER — MEPERIDINE HCL 25 MG/ML IJ SOLN: 6.2500 mg | INTRAMUSCULAR | Status: DC | PRN

## 2017-07-15 MED ORDER — METHOCARBAMOL 500 MG PO TABS
500.0000 mg | ORAL_TABLET | Freq: Four times a day (QID) | ORAL | Status: DC | PRN
  Administered 2017-07-15 – 2017-07-16 (×3): 500 mg via ORAL
  Filled 2017-07-15 (×3): qty 1

## 2017-07-15 MED ORDER — ALFUZOSIN HCL ER 10 MG PO TB24
10.0000 mg | ORAL_TABLET | Freq: Every day | ORAL | Status: DC
  Administered 2017-07-15: 10 mg via ORAL
  Filled 2017-07-15: qty 1

## 2017-07-15 MED ORDER — DOXYCYCLINE HYCLATE 100 MG PO TABS
100.0000 mg | ORAL_TABLET | Freq: Every day | ORAL | Status: DC
  Administered 2017-07-16: 100 mg via ORAL
  Filled 2017-07-15: qty 1

## 2017-07-15 MED ORDER — LORATADINE 10 MG PO TABS
10.0000 mg | ORAL_TABLET | Freq: Every day | ORAL | Status: DC
  Administered 2017-07-16: 10 mg via ORAL
  Filled 2017-07-15: qty 1

## 2017-07-15 MED ORDER — METOCLOPRAMIDE HCL 5 MG/ML IJ SOLN: 5.0000 mg | Freq: Three times a day (TID) | INTRAMUSCULAR | Status: DC | PRN

## 2017-07-15 MED ORDER — OXYCODONE HCL 5 MG PO TABS
5.0000 mg | ORAL_TABLET | ORAL | Status: DC | PRN
  Administered 2017-07-15 – 2017-07-16 (×4): 10 mg via ORAL
  Filled 2017-07-15 (×4): qty 2

## 2017-07-15 MED ORDER — TRANEXAMIC ACID 1000 MG/10ML IV SOLN
2000.0000 mg | INTRAVENOUS | Status: AC
  Administered 2017-07-15: 2000 mg via TOPICAL
  Filled 2017-07-15: qty 20

## 2017-07-15 MED ORDER — NITROGLYCERIN 0.4 MG SL SUBL: 0.4000 mg | SUBLINGUAL_TABLET | SUBLINGUAL | Status: DC | PRN

## 2017-07-15 SURGICAL SUPPLY — 82 items
BAG DECANTER FOR FLEXI CONT (MISCELLANEOUS) ×2 IMPLANT
BANDAGE ACE 4X5 VEL STRL LF (GAUZE/BANDAGES/DRESSINGS) ×3 IMPLANT
BANDAGE ACE 6X5 VEL STRL LF (GAUZE/BANDAGES/DRESSINGS) ×3 IMPLANT
BANDAGE ESMARK 6X9 LF (GAUZE/BANDAGES/DRESSINGS) ×1 IMPLANT
BIT DRILL 7/64X5 DISP (BIT) ×3 IMPLANT
BLADE SURG 10 STRL SS (BLADE) ×4 IMPLANT
BNDG CMPR 9X6 STRL LF SNTH (GAUZE/BANDAGES/DRESSINGS) ×1
BNDG CMPR MED 10X6 ELC LF (GAUZE/BANDAGES/DRESSINGS) ×1
BNDG COHESIVE 4X5 TAN STRL (GAUZE/BANDAGES/DRESSINGS) ×3 IMPLANT
BNDG COHESIVE 6X5 TAN STRL LF (GAUZE/BANDAGES/DRESSINGS) ×2 IMPLANT
BNDG ELASTIC 6X10 VLCR STRL LF (GAUZE/BANDAGES/DRESSINGS) ×2 IMPLANT
BNDG ESMARK 6X9 LF (GAUZE/BANDAGES/DRESSINGS) ×3
COVER MAYO STAND STRL (DRAPES) ×3 IMPLANT
COVER SURGICAL LIGHT HANDLE (MISCELLANEOUS) ×2 IMPLANT
CUFF TOURNIQUET SINGLE 34IN LL (TOURNIQUET CUFF) IMPLANT
CUFF TOURNIQUET SINGLE 44IN (TOURNIQUET CUFF) ×2 IMPLANT
DECANTER SPIKE VIAL GLASS SM (MISCELLANEOUS) ×2 IMPLANT
DRAIN HEMOVAC 7FR (DRAIN) ×2 IMPLANT
DRAPE INCISE IOBAN 66X45 STRL (DRAPES) ×5 IMPLANT
DRAPE U-SHAPE 47X51 STRL (DRAPES) ×3 IMPLANT
DRSG AQUACEL AG ADV 3.5X10 (GAUZE/BANDAGES/DRESSINGS) ×2 IMPLANT
DRSG PAD ABDOMINAL 8X10 ST (GAUZE/BANDAGES/DRESSINGS) ×3 IMPLANT
DURAPREP 26ML APPLICATOR (WOUND CARE) ×3 IMPLANT
ELECT REM PT RETURN 9FT ADLT (ELECTROSURGICAL) ×3
ELECTRODE REM PT RTRN 9FT ADLT (ELECTROSURGICAL) ×1 IMPLANT
FACESHIELD WRAPAROUND (MASK) ×6 IMPLANT
FACESHIELD WRAPAROUND OR TEAM (MASK) IMPLANT
GAUZE SPONGE 4X4 12PLY STRL (GAUZE/BANDAGES/DRESSINGS) ×3 IMPLANT
GAUZE XEROFORM 1X8 LF (GAUZE/BANDAGES/DRESSINGS) ×3 IMPLANT
GLOVE BIO SURGEON STRL SZ7.5 (GLOVE) ×3 IMPLANT
GLOVE BIO SURGEON STRL SZ8.5 (GLOVE) ×3 IMPLANT
GLOVE BIOGEL PI IND STRL 8 (GLOVE) ×1 IMPLANT
GLOVE BIOGEL PI IND STRL 9 (GLOVE) ×1 IMPLANT
GLOVE BIOGEL PI INDICATOR 8 (GLOVE) ×2
GLOVE BIOGEL PI INDICATOR 9 (GLOVE) ×2
GOWN EXTRA PROTECTION XXL 0583 (GOWNS) ×3 IMPLANT
GOWN STRL REUS W/ TWL LRG LVL3 (GOWN DISPOSABLE) ×2 IMPLANT
GOWN STRL REUS W/ TWL XL LVL3 (GOWN DISPOSABLE) ×1 IMPLANT
GOWN STRL REUS W/TWL LRG LVL3 (GOWN DISPOSABLE) ×6
GOWN STRL REUS W/TWL XL LVL3 (GOWN DISPOSABLE) ×3
IMMOBILIZER KNEE 22 (SOFTGOODS) ×2 IMPLANT
IMMOBILIZER KNEE 22 UNIV (SOFTGOODS) ×2 IMPLANT
KIT BASIN OR (CUSTOM PROCEDURE TRAY) ×3 IMPLANT
KIT ROOM TURNOVER OR (KITS) ×3 IMPLANT
MANIFOLD NEPTUNE II (INSTRUMENTS) ×3 IMPLANT
NDL 18GX1X1/2 (RX/OR ONLY) (NEEDLE) IMPLANT
NEEDLE 18GX1X1/2 (RX/OR ONLY) (NEEDLE) ×3 IMPLANT
NEEDLE 22X1 1/2 (OR ONLY) (NEEDLE) ×4 IMPLANT
NS IRRIG 1000ML POUR BTL (IV SOLUTION) ×3 IMPLANT
PACK ORTHO EXTREMITY (CUSTOM PROCEDURE TRAY) ×3 IMPLANT
PAD ARMBOARD 7.5X6 YLW CONV (MISCELLANEOUS) ×6 IMPLANT
PAD CAST 4YDX4 CTTN HI CHSV (CAST SUPPLIES) ×2 IMPLANT
PADDING CAST COTTON 4X4 STRL (CAST SUPPLIES) ×6
PASSER SUT SWANSON 36MM LOOP (INSTRUMENTS) ×3 IMPLANT
PULSAVAC PLUS IRRIG FAN TIP (DISPOSABLE) ×3
SPONGE LAP 18X18 X RAY DECT (DISPOSABLE) ×3 IMPLANT
STAPLER VISISTAT 35W (STAPLE) ×3 IMPLANT
STOCKINETTE IMPERVIOUS 9X36 MD (GAUZE/BANDAGES/DRESSINGS) ×3 IMPLANT
STOCKINETTE IMPERVIOUS LG (DRAPES) ×2 IMPLANT
SUCTION FRAZIER HANDLE 10FR (MISCELLANEOUS)
SUCTION TUBE FRAZIER 10FR DISP (MISCELLANEOUS) IMPLANT
SUT ETHIBOND 2 V 37 (SUTURE) IMPLANT
SUT FIBERWIRE #2 38 REV NDL BL (SUTURE)
SUT FIBERWIRE 2-0 18 17.9 3/8 (SUTURE) ×6
SUT PDS AB 4-0 P3 18 (SUTURE) IMPLANT
SUT VIC AB 1 CT1 27 (SUTURE) ×3
SUT VIC AB 1 CT1 27XBRD ANBCTR (SUTURE) ×1 IMPLANT
SUT VIC AB 1 CTX 27 (SUTURE) ×8 IMPLANT
SUT VIC AB 2-0 CTB1 (SUTURE) ×3 IMPLANT
SUT VIC AB 3-0 FS2 27 (SUTURE) ×4 IMPLANT
SUTURE FIBERWR 2-0 18 17.9 3/8 (SUTURE) IMPLANT
SUTURE FIBERWR#2 38 REV NDL BL (SUTURE) IMPLANT
SYR CONTROL 10ML LL (SYRINGE) ×4 IMPLANT
SYRINGE 60CC LL (MISCELLANEOUS) ×4 IMPLANT
TIP FAN IRRIG PULSAVAC PLUS (DISPOSABLE) IMPLANT
TOWEL OR 17X24 6PK STRL BLUE (TOWEL DISPOSABLE) ×3 IMPLANT
TOWEL OR 17X26 10 PK STRL BLUE (TOWEL DISPOSABLE) ×3 IMPLANT
TUBE CONNECTING 12'X1/4 (SUCTIONS) ×1
TUBE CONNECTING 12X1/4 (SUCTIONS) ×2 IMPLANT
UNDERPAD 30X30 (UNDERPADS AND DIAPERS) ×3 IMPLANT
WATER STERILE IRR 1000ML POUR (IV SOLUTION) ×1 IMPLANT
YANKAUER SUCT BULB TIP NO VENT (SUCTIONS) ×3 IMPLANT

## 2017-07-15 NOTE — Progress Notes (Signed)
Pt given 10mg  oxy. Pt states one fell out of his hand. No pill found in bed. RN saw two pills in pt mouth. Pt made RN look in bed. No pill found.

## 2017-07-15 NOTE — Anesthesia Procedure Notes (Signed)
Procedure Name: MAC Date/Time: 07/15/2017 7:30 AM Performed by: Neldon Newport Pre-anesthesia Checklist: Timeout performed, Patient being monitored, Suction available, Emergency Drugs available and Patient identified Oxygen Delivery Method: Simple face mask

## 2017-07-15 NOTE — Anesthesia Procedure Notes (Signed)
Spinal  Patient location during procedure: OR Staffing Anesthesiologist: Lyndle Herrlich Spinal Block Patient position: sitting Prep: DuraPrep Patient monitoring: heart rate, blood pressure and continuous pulse ox Approach: right paramedian Location: L3-4 Injection technique: single-shot Needle Needle type: Sprotte  Needle gauge: 24 G Needle length: 9 cm Assessment Sensory level: T8 Additional Notes Spinal Dosage in OR  .75% Bupivicaine ml       1.9

## 2017-07-15 NOTE — Anesthesia Postprocedure Evaluation (Signed)
Anesthesia Post Note  Patient: [name]  Procedure(s) Performed: REPAIR QUADRICEP TENDON (Left Knee)     Patient location during evaluation: PACU Anesthesia Type: Spinal Level of consciousness: awake Pain management: satisfactory to patient Vital Signs Assessment: post-procedure vital signs reviewed and stable Respiratory status: spontaneous breathing Cardiovascular status: blood pressure returned to baseline Postop Assessment: no headache and spinal receding Anesthetic complications: no    Last Vitals:  Vitals:   07/15/17 1115 07/15/17 1130  BP: (!) 152/68   Pulse: 74 71  Resp: 14 15  Temp: (!) 36.2 C   SpO2: 98% 97%    Last Pain:  Vitals:   07/15/17 1115  TempSrc:   PainSc: [place]

## 2017-07-15 NOTE — Discharge Instructions (Addendum)
INSTRUCTIONS AFTER JOINT REPLACEMENT  ° °o Remove items at home which could result in a fall. This includes throw rugs or furniture in walking pathways °o ICE to the affected joint every three hours while awake for 30 minutes at a time, for at least the first 3-5 days, and then as needed for pain and swelling.  Continue to use ice for pain and swelling. You may notice swelling that will progress down to the foot and ankle.  This is normal after surgery.  Elevate your leg when you are not up walking on it.   °o Continue to use the breathing machine you got in the hospital (incentive spirometer) which will help keep your temperature down.  It is common for your temperature to cycle up and down following surgery, especially at night when you are not up moving around and exerting yourself.  The breathing machine keeps your lungs expanded and your temperature down. ° ° °DIET:  As you were doing prior to hospitalization, we recommend a well-balanced diet. ° °DRESSING / WOUND CARE / SHOWERING ° °Keep the surgical dressing until follow up.  The dressing is water proof, so you can shower without any extra covering.  IF THE DRESSING FALLS OFF or the wound gets wet inside, change the dressing with sterile gauze.  Please use good hand washing techniques before changing the dressing.  Do not use any lotions or creams on the incision until instructed by your surgeon.   ° °ACTIVITY ° °o Increase activity slowly as tolerated, but follow the weight bearing instructions below.   °o No driving for 6 weeks or until further direction given by your physician.  You cannot drive while taking narcotics.  °o No lifting or carrying greater than 10 lbs. until further directed by your surgeon. °o Avoid periods of inactivity such as sitting longer than an hour when not asleep. This helps prevent blood clots.  °o You may return to work once you are authorized by your doctor.  ° ° ° °WEIGHT BEARING  ° °Weight bearing as tolerated with assist  device (walker, cane, etc) as directed, use it as long as suggested by your surgeon or therapist, typically at least 4-6 weeks. ° ° °EXERCISES ° °Results after joint replacement surgery are often greatly improved when you follow the exercise, range of motion and muscle strengthening exercises prescribed by your doctor. Safety measures are also important to protect the joint from further injury. Any time any of these exercises cause you to have increased pain or swelling, decrease what you are doing until you are comfortable again and then slowly increase them. If you have problems or questions, call your caregiver or physical therapist for advice.  ° °Rehabilitation is important following a joint replacement. After just a few days of immobilization, the muscles of the leg can become weakened and shrink (atrophy).  These exercises are designed to build up the tone and strength of the thigh and leg muscles and to improve motion. Often times heat used for twenty to thirty minutes before working out will loosen up your tissues and help with improving the range of motion but do not use heat for the first two weeks following surgery (sometimes heat can increase post-operative swelling).  ° °These exercises can be done on a training (exercise) mat, on the floor, on a table or on a bed. Use whatever works the best and is most comfortable for you.    Use music or television while you are exercising so that   the exercises are a pleasant break in your day. This will make your life better with the exercises acting as a break in your routine that you can look forward to.   Perform all exercises about fifteen times, three times per day or as directed.  You should exercise both the operative leg and the other leg as well. ° °Exercises include: °  °• Quad Sets - Tighten up the muscle on the front of the thigh (Quad) and hold for 5-10 seconds.   °• Straight Leg Raises - With your knee straight (if you were given a brace, keep it on),  lift the leg to 60 degrees, hold for 3 seconds, and slowly lower the leg.  Perform this exercise against resistance later as your leg gets stronger.  °• Leg Slides: Lying on your back, slowly slide your foot toward your buttocks, bending your knee up off the floor (only go as far as is comfortable). Then slowly slide your foot back down until your leg is flat on the floor again.  °• Angel Wings: Lying on your back spread your legs to the side as far apart as you can without causing discomfort.  °• Hamstring Strength:  Lying on your back, push your heel against the floor with your leg straight by tightening up the muscles of your buttocks.  Repeat, but this time bend your knee to a comfortable angle, and push your heel against the floor.  You may put a pillow under the heel to make it more comfortable if necessary.  ° °A rehabilitation program following joint replacement surgery can speed recovery and prevent re-injury in the future due to weakened muscles. Contact your doctor or a physical therapist for more information on knee rehabilitation.  ° ° °CONSTIPATION ° °Constipation is defined medically as fewer than three stools per week and severe constipation as less than one stool per week.  Even if you have a regular bowel pattern at home, your normal regimen is likely to be disrupted due to multiple reasons following surgery.  Combination of anesthesia, postoperative narcotics, change in appetite and fluid intake all can affect your bowels.  ° °YOU MUST use at least one of the following options; they are listed in order of increasing strength to get the job done.  They are all available over the counter, and you may need to use some, POSSIBLY even all of these options:   ° °Drink plenty of fluids (prune juice may be helpful) and high fiber foods °Colace 100 mg by mouth twice a day  °Senokot for constipation as directed and as needed Dulcolax (bisacodyl), take with full glass of water  °Miralax (polyethylene glycol)  once or twice a day as needed. ° °If you have tried all these things and are unable to have a bowel movement in the first 3-4 days after surgery call either your surgeon or your primary doctor.   ° °If you experience loose stools or diarrhea, hold the medications until you stool forms back up.  If your symptoms do not get better within 1 week or if they get worse, check with your doctor.  If you experience "the worst abdominal pain ever" or develop nausea or vomiting, please contact the office immediately for further recommendations for treatment. ° ° °ITCHING:  If you experience itching with your medications, try taking only a single pain pill, or even half a pain pill at a time.  You can also use Benadryl over the counter for itching or also to   help with sleep.   TED HOSE STOCKINGS:  Use stockings on both legs until for at least 2 weeks or as directed by physician office. They may be removed at night for sleeping.  MEDICATIONS:  See your medication summary on the After Visit Summary that nursing will review with you.  You may have some home medications which will be placed on hold until you complete the course of blood thinner medication.  It is important for you to complete the blood thinner medication as prescribed.  PRECAUTIONS:  If you experience chest pain or shortness of breath - call 911 immediately for transfer to the hospital emergency department.   If you develop a fever greater that 101 F, purulent drainage from wound, increased redness or drainage from wound, foul odor from the wound/dressing, or calf pain - CONTACT YOUR SURGEON.                                                   FOLLOW-UP APPOINTMENTS:  If you do not already have a post-op appointment, please call the office for an appointment to be seen by your surgeon.  Guidelines for how soon to be seen are listed in your After Visit Summary, but are typically between 1-4 weeks after surgery.  OTHER INSTRUCTIONS:   Knee  Replacement:  Do not place pillow under knee, focus on keeping the knee straight while resting. CPM instructions: 0-90 degrees, 2 hours in the morning, 2 hours in the afternoon, and 2 hours in the evening. Place foam block, curve side up under heel at all times except when in CPM or when walking.  DO NOT modify, tear, cut, or change the foam block in any way.  MAKE SURE YOU:   Understand these instructions.   Get help right away if you are not doing well or get worse.    Thank you for letting us be a part of your medical care team.  It is a privilege we respect greatly.  We hope these instructions will help you stay on track for a fast and full recovery!   Information on my medicine - XARELTO (Rivaroxaban)  Why was Xarelto prescribed for you? Xarelto was prescribed for you to reduce the risk of a blood clot forming that can cause a stroke if you have a medical condition called atrial fibrillation (a type of irregular heartbeat).  What do you need to know about xarelto ? Take your Xarelto ONCE DAILY at the same time every day with your evening meal. If you have difficulty swallowing the tablet whole, you may crush it and mix in applesauce just prior to taking your dose.  Take Xarelto exactly as prescribed by your doctor and DO NOT stop taking Xarelto without talking to the doctor who prescribed the medication.  Stopping without other stroke prevention medication to take the place of Xarelto may increase your risk of developing a clot that causes a stroke.  Refill your prescription before you run out.  After discharge, you should have regular check-up appointments with your healthcare provider that is prescribing your Xarelto.  In the future your dose may need to be changed if your kidney function or weight changes by a significant amount.  What do you do if you miss a dose? If you are taking Xarelto ONCE DAILY and you miss a dose, take it as  soon as you remember on the same day then  continue your regularly scheduled once daily regimen the next day. Do not take two doses of Xarelto at the same time or on the same day.   Important Safety Information A possible side effect of Xarelto is bleeding. You should call your healthcare provider right away if you experience any of the following: ? Bleeding from an injury or your nose that does not stop. ? Unusual colored urine (red or dark brown) or unusual colored stools (red or black). ? Unusual bruising for unknown reasons. ? A serious fall or if you hit your head (even if there is no bleeding).  Some medicines may interact with Xarelto and might increase your risk of bleeding while on Xarelto. To help avoid this, consult your healthcare provider or pharmacist prior to using any new prescription or non-prescription medications, including herbals, vitamins, non-steroidal anti-inflammatory drugs (NSAIDs) and supplements.  This website has more information on Xarelto: [email or web address].[email or web address]

## 2017-07-15 NOTE — Op Note (Signed)
PATIENT ID:      TASMAN ZAPATA  MRN:     [id number] DOB/AGE:    1945/11/29 / 71 y.o.       OPERATIVE REPORT    DATE OF PROCEDURE:  07/15/2017       PREOPERATIVE DIAGNOSIS:   LEFT QUAD TENDON VMO TEAR      Estimated body mass index is 35.3 kg/m as calculated from the following:   Height as of this encounter: 6\' 4"  (1.93 m).   Weight as of this encounter: 131.5 kg (290 lb).                                                        POSTOPERATIVE DIAGNOSIS:   LEFT QUAD TENDON VMO TEAR                                                                      PROCEDURE:  Procedure(s): Left vastus medialis repaired to the quad tendon and large lateral release for patellar realignment of revision left total knee    SURGEON: NWGNF,AOZHY J    ASSISTANT:   Eric K[employer]   (Present and scrubbed throughout the case, critical for assistance with exposure, retraction, instrumentation, and closure.)         ANESTHESIA: Spinal, 20cc Exparel, 50cc 0.25% Marcaine  EBL: 500 mL  FLUID REPLACEMENT: 1800 mL crystalloid  TOURNIQUET TIME: 23min  Drains: None  Tranexamic Acid: 1gm IV, 2gm topical  COMPLICATIONS:  None         INDICATIONS FOR PROCEDURE: Patient underwent revision left total knee arthroplasty over the summer for a patellar implant fitted and she come loose and was floating around inside the knee. At the time of surgery is also noted to have a loose tibial component his revised to a new 41 mm patellar button from [employer] as well as a long stemmed MBT implant. Did well for 10 days on the 10th a he slipped and sustained a tear of the VMO with lateral subluxation of the patella as he flex past about 30. This is persisted   Risks  in spite of bracing with a knee immobilizer Omniflex past 30 the patella subluxes laterally and this is been documented on sunrise x-rays. To stabilize the patella increase function and decrease discomfort he desires elective VMO repair and lateral release.and benefits of  surgery have been discussed, questions answered.   DESCRIPTION OF PROCEDURE: The patient identified by armband, received  IV antibiotics, in the holding area at [facility]. Patient taken to the operating room, appropriate anesthetic  monitors were attachedspinal anesthesia was  induced. Tourniquet  applied high to the operative thigh. Lateral post and foot positioner  applied to the table, the lower extremity was then prepped and draped  in usual sterile fashion from the toes to the tourniquet. Time-out procedure was performed. We began the operation, with the knee flexed 120 degrees, by making the anterior midline incision starting at handbreadth above the patella going over the patella 1 cm medial to and 4 cm distal to the tibial tubercle.  Small bleeders in the skin and the  subcutaneous tissue identified and cauterized. Transverse retinaculum was incised and reflected medially and a medial parapatellar arthrotomy was accomplished. we immediately encountered large amounts of scar tissue and there was an obvious tear with scarring of the vastus medialis as it approached the patellar tendon. Large amounts of scar tissue removed especially laterally where the patella had scarred down in a more lateral position there was some significant bleeding during this part of the procedure and the total blood loss was about 500 mL. We continued to remove scar tissue laterally performing a lateral release and once this is been finally accomplished we are able to keep the patella stable as we flex past 40-60-90. At this point the tourniquet was put up bleeders were identified and cauterized we continued to remove scar tissue superiorly and medially. We did a temporary medial reefing bringing the VMO over the top of the patella and the quad tendon and found in a stable again from 0-90. Tourniquet was let down small bleeders again identified and cauterized the wound thoroughly irrigated out normal saline solution  and we performed the permanent repair using running #1 Vicryl suture bringing the medial side of the parapatellar arthrotomy over the quad tendon and patella stabilizing the patella as it taken through range of motion. Prior to this we did place a medium Hemovac drains in the wound with suction. The wound is on skin irrigated out normal saline solution we then closed in layers with 2-0 subcutaneous Vicryl suture and 3-0 subcuticular Vicryl suture. A dressing of aqua cell an Ace wrap was then applied. Patient was placed in knee immobilizer and the medium Hemovac drains were kept to suction to be removed tomorrow.  Khamani Fairley J 07/15/2017, 9:37 AM

## 2017-07-15 NOTE — Interval H&P Note (Signed)
History and Physical Interval Note:  07/15/2017 7:15 AM  [name]  has presented today for surgery, with the diagnosis of LEFT QUAD TENDON VMO TEAR  The various methods of treatment have been discussed with the patient and family. After consideration of risks, benefits and other options for treatment, the patient has consented to  Procedure(s): REPAIR QUADRICEP TENDON (Left) as a surgical intervention .  The patient's history has been reviewed, patient examined, no change in status, stable for surgery.  I have reviewed the patient's chart and labs.  Questions were answered to the patient's satisfaction.     Kerin Salen

## 2017-07-15 NOTE — Transfer of Care (Signed)
Immediate Anesthesia Transfer of Care Note  Patient: [name]  Procedure(s) Performed: REPAIR QUADRICEP TENDON (Left Knee)  Patient Location: PACU  Anesthesia Type:Spinal  Level of Consciousness: awake, alert  and oriented  Airway & Oxygen Therapy: Patient Spontanous Breathing  Post-op Assessment: Report given to RN, Post -op Vital signs reviewed and stable and Patient moving all extremities X 4  Post vital signs: Reviewed and stable  Last Vitals:  Vitals:   07/15/17 0620  BP: (!) 166/56  Pulse: 83  Resp: 19  Temp: 36.6 C  SpO2: 100%    Last Pain:  Vitals:   07/15/17 0620  TempSrc: Oral         Complications: No apparent anesthesia complications

## 2017-07-16 ENCOUNTER — Encounter (HOSPITAL_COMMUNITY): Payer: Self-pay | Admitting: Orthopedic Surgery

## 2017-07-16 DIAGNOSIS — S76112A Strain of left quadriceps muscle, fascia and tendon, initial encounter: Secondary | ICD-10-CM | POA: Diagnosis not present

## 2017-07-16 NOTE — Evaluation (Signed)
Physical Therapy Evaluation Patient Details Name: [name] MRN: [id number] DOB: 15-Aug-1946 Today's Date: 07/16/2017   History of Present Illness  Patient s/p patellar tendon repair. Pt. with recent TKR and removal of loose patellar component.  1 week after surgery, pt fell and tore VMO off patellar tendon arthrotomy.  PMH is extensive, significant for CAD and multiple orthopedic surgeries.  Clinical Impression  Pt is s/p patellar tendon repair resulting in the deficits listed below (see PT Problem List).  Patient is safe to d/c home from PT standpoint.  Would recommend OPPT if patient does not progress at home.  Discussed with patient and husband to progress mobility and NO bending left knee until cleared by Dr. Mayer Camel.      Follow Up Recommendations Outpatient PT (pt declines HHPT, recommend OP PT for progression of mobility/ROM)    Equipment Recommendations  None recommended by PT    Recommendations for Other Services       Precautions / Restrictions Precautions Precautions: Fall Required Braces or Orthoses: Knee Immobilizer - Left Knee Immobilizer - Left: On at all times Restrictions Weight Bearing Restrictions: Yes LLE Weight Bearing: Weight bearing as tolerated      Mobility  Bed Mobility Overal bed mobility: Needs Assistance Bed Mobility: Supine to Sit     Supine to sit: Min assist     General bed mobility comments: min assist for LLE support  Transfers Overall transfer level: Needs assistance Equipment used: Rolling walker (2 wheeled) Transfers: Sit to/from Stand Sit to Stand: Min guard         General transfer comment: min guard for safety/balance  Ambulation/Gait Ambulation/Gait assistance: Min guard Ambulation Distance (Feet): 75 Feet Assistive device: Rolling walker (2 wheeled) Gait Pattern/deviations: Step-to pattern;Decreased step length - left;Decreased stance time - left Gait velocity: decreased   General Gait Details: min guard for  safety/balance  Stairs            Wheelchair Mobility    Modified Rankin (Stroke Patients Only)       Balance Overall balance assessment: Needs assistance Sitting-balance support: Bilateral upper extremity supported;Feet supported Sitting balance-Leahy Scale: Good     Standing balance support: Bilateral upper extremity supported Standing balance-Leahy Scale: Poor Standing balance comment: reliant on  RW for balance                             Pertinent Vitals/Pain Pain Assessment: 0-10 Pain Score: 5  Pain Location: left knee Pain Descriptors / Indicators: Aching;Discomfort Pain Intervention(s): Limited activity within patient's tolerance;Monitored during session;Repositioned    Home Living Family/patient expects to be discharged to:: Private residence Living Arrangements: Spouse/significant other Available Help at Discharge: Family;Available 24 hours/day Type of Home: House Home Access: Stairs to enter Entrance Stairs-Rails: Right Entrance Stairs-Number of Steps: 4 Home Layout: Two level;Bed/bath upstairs Home Equipment: Walker - 2 wheels;Bedside commode;Cane - single point;Shower seat;Grab bars - tub/shower;Hand held shower head;Adaptive equipment      Prior Function Level of Independence: Independent with assistive device(s)         Comments: used rolling walker      Hand Dominance        Extremity/Trunk Assessment        Lower Extremity Assessment Lower Extremity Assessment: Overall WFL for tasks assessed;LLE deficits/detail LLE: Unable to fully assess due to immobilization       Communication   Communication: No difficulties  Cognition Arousal/Alertness: Awake/alert Behavior During Therapy: WFL for tasks  assessed/performed Overall Cognitive Status: Within Functional Limits for tasks assessed                                        General Comments      Exercises Total Joint Exercises Ankle Circles/Pumps:  AROM;Both;10 reps   Assessment/Plan    PT Assessment All further PT needs can be met in the next venue of care  PT Problem List Decreased strength;Decreased range of motion;Decreased activity tolerance;Decreased balance;Decreased mobility;Pain       PT Treatment Interventions      PT Goals (Current goals can be found in the Care Plan section)  Acute Rehab PT Goals Patient Stated Goal: go home PT Goal Formulation: All assessment and education complete, DC therapy    Frequency     Barriers to discharge        Co-evaluation               AM-PAC PT "6 Clicks" Daily Activity  Outcome Measure Difficulty turning over in bed (including adjusting bedclothes, sheets and blankets)?: A Little Difficulty moving from lying on back to sitting on the side of the bed? : A Little Difficulty sitting down on and standing up from a chair with arms (e.g., wheelchair, bedside commode, etc,.)?: A Little Help needed moving to and from a bed to chair (including a wheelchair)?: A Little Help needed walking in hospital room?: A Little Help needed climbing 3-5 steps with a railing? : A Little 6 Click Score: 18    End of Session Equipment Utilized During Treatment: Left knee immobilizer Activity Tolerance: Patient tolerated treatment well Patient left: in chair;with call bell/phone within reach Nurse Communication: Mobility status PT Visit Diagnosis: Other abnormalities of gait and mobility (R26.89);Difficulty in walking, not elsewhere classified (R26.2)    Time: 7322-0254 PT Time Calculation (min) (ACUTE ONLY): 39 min   Charges:   PT Evaluation $PT Eval Moderate Complexity: 1 Mod PT Treatments $Gait Training: 8-22 mins $Therapeutic Exercise: 8-22 mins   PT G Codes:   PT G-Codes **NOT FOR INPATIENT CLASS** Functional Assessment Tool Used: AM-PAC 6 Clicks Basic Mobility Functional Limitation: Mobility: Walking and moving around Mobility: Walking and Moving Around Current Status (Y7062):  At least 40 percent but less than 60 percent impaired, limited or restricted Mobility: Walking and Moving Around Goal Status [phone]): At least 40 percent but less than 60 percent impaired, limited or restricted Mobility: Walking and Moving Around Discharge Status [phone]): At least 40 percent but less than 60 percent impaired, limited or restricted    07/16/2017 [facility], PT [place], [place] 07/16/2017, 12:02 PM

## 2017-07-16 NOTE — Progress Notes (Signed)
OT Cancellation Note  Patient Details Name: [name] MRN: [id number] DOB: [birth year]   Cancelled Treatment:    Reason Eval/Treat Not Completed: OT screened, no needs identified, will sign off. Per conversation with PT and pt, pt with strategies already in place for ADLs and transfers/mobility.    Tyrone Schimke OTR/L Pager: [phone]  07/16/2017, 12:03 PM

## 2017-07-16 NOTE — Discharge Summary (Signed)
Patient ID: [name] MRN: [id number] DOB/AGE: September 28, 1946 71 y.o.  Admit date: 07/15/2017 Discharge date: 07/16/2017  Admission Diagnoses:  Principal Problem:   Recurrent subluxation of patella, left Active Problems:   Recurrent dislocation of left patella   Discharge Diagnoses:  Same  Past Medical History:  Diagnosis Date  . A-fib (HCC)    hx.- sinus rhythm now  . Acid reflux   . Anxiety   . Arthritis   . BPH (benign prostatic hyperplasia)   . CAD (coronary artery disease)   . Depression   . Dislocation of internal right hip prosthesis, sequela   . Dysrhythmia    hx a fib  . Environmental allergies   . Fracture of wrist 10/23/13   LEFT  . High cholesterol   . History of kidney stones    surg. removal 02/2017  . HLD (hyperlipidemia)   . Hypertension   . Hypothyroidism    thyroid removed  . Left knee injury    cap dislocated  . Low testosterone, possible hypogonadism 10/01/2012  . Lumbago   . Metabolic bone disease   . Osteoporosis   . Plaque psoriasis   . Sleep apnea    cpap machine-settings 17  . Thrombocytopenia ([place])   . Thyroid disease    HX GRAVES DISEASE  . Transfusion history    s/p 12'13 hip surgery  . Unsteady gait   . Unsteady gait   . Vertebral compression fracture (HCC)    L1-wears brace    Surgeries: Procedure(s): REPAIR QUADRICEP TENDON on 07/15/2017   Consultants:   Discharged Condition: Improved  Hospital Course: [name] is an 71 y.o. male who was admitted 07/15/2017 for operative treatment ofRecurrent subluxation of patella, left. Patient has severe unremitting pain that affects sleep, daily activities, and work/hobbies. After pre-op clearance the patient was taken to the operating room on 07/15/2017 and underwent  Procedure(s): REPAIR QUADRICEP TENDON.    Patient was given perioperative antibiotics: Anti-infectives    Start     Dose/Rate Route Frequency Ordered Stop   07/15/17 1230  doxycycline (VIBRA-TABS) tablet 100  mg     100 mg Oral Daily 07/15/17 1221     07/15/17 0700  ceFAZolin (ANCEF) 3 g in dextrose 5 % 50 mL IVPB     3 g 130 mL/hr over 30 Minutes Intravenous To ShortStay Surgical 07/14/17 1020 07/15/17 0741       Patient was given sequential compression devices, early ambulation, and chemoprophylaxis to prevent DVT.  Patient benefited maximally from hospital stay and there were no complications.    Recent vital signs: Patient Vitals for the past 24 hrs:  BP Temp Temp src Pulse Resp SpO2  07/16/17 0605 - 98.5 F (36.9 C) - - - -  07/16/17 0558 121/71 - - 96 20 99 %  07/16/17 0032 127/64 97.9 F (36.6 C) Oral 68 16 99 %  07/15/17 2055 138/60 97.9 F (36.6 C) Oral 70 16 99 %  07/15/17 1648 (!) 158/58 97.9 F (36.6 C) Oral 75 16 99 %  07/15/17 1207 (!) 156/64 (!) 97.4 F (36.3 C) Axillary 74 16 98 %  07/15/17 1145 - - - 76 15 96 %  07/15/17 1130 (!) 144/74 - - 71 15 97 %  07/15/17 1115 (!) 152/68 (!) 97.1 F (36.2 C) - 74 14 98 %  07/15/17 1100 (!) 143/67 - - 67 15 97 %  07/15/17 1051 134/64 - - 74 15 95 %  07/15/17 1045 - - -  68 10 98 %  07/15/17 1042 - - - 68 (!) 8 99 %  07/15/17 1035 126/63 - - 71 13 98 %  07/15/17 1030 - - - 72 17 98 %     Recent laboratory studies:  Recent Labs  07/15/17 0635  INR 1.10     Discharge Medications:   Allergies as of 07/16/2017   No Active Allergies     Medication List    TAKE these medications   alfuzosin 10 MG 24 hr tablet Commonly known as:  UROXATRAL Take 10 mg by mouth at bedtime.   amoxicillin 500 MG capsule Commonly known as:  AMOXIL Take 2,000 mg by mouth See admin instructions. Takes 4 capsules 1 hour prior to dental procedures   atorvastatin 80 MG tablet Commonly known as:  LIPITOR Take 80 mg by mouth 2 (two) times a week. Monday & Friday evenings ONLY   buPROPion 300 MG 24 hr tablet Commonly known as:  WELLBUTRIN XL Take 300 mg by mouth daily with breakfast.   CALCIUM CITRATE + D PO Take 1 tablet by mouth 2  (two) times daily.   clonazePAM 1 MG tablet Commonly known as:  KLONOPIN Take one tablet by mouth every 6 hours What changed:  how much to take  how to take this  when to take this  reasons to take this  additional instructions   denosumab 60 MG/ML Soln injection Commonly known as:  PROLIA Inject 60 mg into the skin every 6 (six) months. Administer in upper arm, thigh, or abdomen   diltiazem 240 MG 24 hr capsule Commonly known as:  CARDIZEM CD Take 240 mg by mouth daily.   doxycycline 100 MG tablet Commonly known as:  VIBRA-TABS Take 100 mg by mouth daily.   DULoxetine 60 MG capsule Commonly known as:  CYMBALTA Take 60 mg by mouth 2 (two) times daily.   ENBREL 50 MG/ML injection Generic drug:  etanercept Inject 50 mg into the skin 2 (two) times a week. Tuesday & Friday   EPIPEN 2-PAK 0.3 mg/0.3 mL Soaj injection Generic drug:  EPINEPHrine Inject 0.3 mg into the muscle daily as needed (for anaphylactic reaction).   IRON 27 PO Take 1 tablet by mouth 2 (two) times daily.   levothyroxine 200 MCG tablet Commonly known as:  SYNTHROID, LEVOTHROID Take 200 mcg by mouth daily at 6 (six) AM. (0500)   levothyroxine 25 MCG tablet Commonly known as:  SYNTHROID, LEVOTHROID Take 25 mcg by mouth daily before breakfast.   loratadine 10 MG tablet Commonly known as:  CLARITIN Take 10 mg by mouth daily.   multivitamin with minerals Tabs tablet Take 1 tablet by mouth daily. Men's One-A-Day 50+   nitrofurantoin 100 MG capsule Commonly known as:  MACRODANTIN Take 100 mg by mouth 2 (two) times daily.   nitroGLYCERIN 0.4 MG SL tablet Commonly known as:  NITROSTAT Place 0.4 mg under the tongue every 5 (five) minutes as needed for chest pain. x3 doses as needed for chest pain   omeprazole 40 MG capsule Commonly known as:  PRILOSEC Take 40 mg by mouth daily before breakfast.   oxyCODONE-acetaminophen 5-325 MG tablet Commonly known as:  PERCOCET/ROXICET Take 1-2 tablets by  mouth every 4 (four) hours as needed for moderate pain.   polyethylene glycol packet Commonly known as:  MIRALAX / GLYCOLAX Take 17 g by mouth daily as needed for mild constipation.   PROBIOTIC PO Take 1 capsule by mouth daily with breakfast.   rivaroxaban 10  MG Tabs tablet Commonly known as:  XARELTO Take 1 tablet (10 mg total) by mouth daily. What changed:  medication Lane  how much to take   testosterone cypionate 200 MG/ML injection Commonly known as:  DEPOTESTOSTERONE CYPIONATE Inject 200mg  (93ml) intramuscularly every 14 days What changed:  how much to take  how to take this  when to take this  additional instructions   tiZANidine 4 MG tablet Commonly known as:  ZANAFLEX Take 1 tablet (4 mg total) by mouth every 6 (six) hours as needed for muscle spasms.   vitamin C 500 MG tablet Commonly known as:  ASCORBIC ACID Take 500 mg by mouth daily.   Vitamin D 2000 units Caps Take 2,000 Units by mouth 2 (two) times daily.       Diagnostic Studies: No results found.  Disposition: 01-Home or Self Care  Discharge Instructions    Call MD / Call 911    Complete by:  As directed    If you experience chest pain or shortness of breath, CALL 911 and be transported to the hospital emergency room.  If you develope a fever above 101 F, pus (white drainage) or increased drainage or redness at the wound, or calf pain, call your surgeon's office.   Constipation Prevention    Complete by:  As directed    Drink plenty of fluids.  Prune juice may be helpful.  You may use a stool softener, such as Colace (over the counter) 100 mg twice a day.  Use MiraLax (over the counter) for constipation as needed.   Diet - low sodium heart healthy    Complete by:  As directed    Discharge instructions    Complete by:  As directed    Ice, elevate, weightbearing as tolerated in knee immobilizer. Keep ace wrap and extra bandage on until Monday. Continue on 10mg  daily of Xarelto for 2 weeks  then we will discuss going back to home 20mg  after that. Follow up with Dr. Mayer Camel in 2 weeks post op.   Increase activity slowly as tolerated    Complete by:  As directed       Follow-up Information    Frederik Pear, MD In 10 days.   Specialty:  Orthopedic Surgery Contact information: [place] [address] [phone]            Signed: Rich Fuchs 07/16/2017, 10:18 AM

## 2017-07-18 ENCOUNTER — Telehealth: Payer: Self-pay | Admitting: *Deleted

## 2017-07-18 NOTE — Telephone Encounter (Signed)
Pt was on TCM list admitted 07/15/17 for Left  Recurrent subluxation of patella had repair quadriceps tendon. Pt was D/C 07/16/17, and will f/u w/ortho Dr. Mayer Camel...[name]

## 2017-07-21 NOTE — Telephone Encounter (Signed)
Got scheduled  °

## 2017-08-16 ENCOUNTER — Other Ambulatory Visit (INDEPENDENT_AMBULATORY_CARE_PROVIDER_SITE_OTHER): Payer: Medicare Other

## 2017-08-16 ENCOUNTER — Encounter: Payer: Self-pay | Admitting: Internal Medicine

## 2017-08-16 ENCOUNTER — Ambulatory Visit (INDEPENDENT_AMBULATORY_CARE_PROVIDER_SITE_OTHER): Payer: Medicare Other | Admitting: Internal Medicine

## 2017-08-16 ENCOUNTER — Ambulatory Visit: Payer: Medicare Other | Admitting: Internal Medicine

## 2017-08-16 VITALS — BP 136/72 | HR 84 | Temp 98.7°F | Ht 76.0 in | Wt 291.0 lb

## 2017-08-16 DIAGNOSIS — Z23 Encounter for immunization: Secondary | ICD-10-CM

## 2017-08-16 DIAGNOSIS — N320 Bladder-neck obstruction: Secondary | ICD-10-CM

## 2017-08-16 DIAGNOSIS — I481 Persistent atrial fibrillation: Secondary | ICD-10-CM

## 2017-08-16 DIAGNOSIS — I4819 Other persistent atrial fibrillation: Secondary | ICD-10-CM

## 2017-08-16 DIAGNOSIS — E785 Hyperlipidemia, unspecified: Secondary | ICD-10-CM | POA: Diagnosis not present

## 2017-08-16 DIAGNOSIS — I10 Essential (primary) hypertension: Secondary | ICD-10-CM

## 2017-08-16 DIAGNOSIS — E559 Vitamin D deficiency, unspecified: Secondary | ICD-10-CM

## 2017-08-16 DIAGNOSIS — R8290 Unspecified abnormal findings in urine: Secondary | ICD-10-CM

## 2017-08-16 DIAGNOSIS — D500 Iron deficiency anemia secondary to blood loss (chronic): Secondary | ICD-10-CM

## 2017-08-16 DIAGNOSIS — R202 Paresthesia of skin: Secondary | ICD-10-CM | POA: Diagnosis not present

## 2017-08-16 DIAGNOSIS — E039 Hypothyroidism, unspecified: Secondary | ICD-10-CM

## 2017-08-16 LAB — CBC WITH DIFFERENTIAL/PLATELET
BASOS PCT: 0.9 % (ref 0.0–3.0)
Basophils Absolute: 0 10*3/uL (ref 0.0–0.1)
EOS ABS: 0.2 10*3/uL (ref 0.0–0.7)
EOS PCT: 3.7 % (ref 0.0–5.0)
HEMATOCRIT: 45.1 % (ref 39.0–52.0)
HEMOGLOBIN: 14.8 g/dL (ref 13.0–17.0)
LYMPHS PCT: 39.4 % (ref 12.0–46.0)
Lymphs Abs: 2.3 10*3/uL (ref 0.7–4.0)
MCHC: 32.7 g/dL (ref 30.0–36.0)
MCV: 89.1 fl (ref 78.0–100.0)
Monocytes Absolute: 0.5 10*3/uL (ref 0.1–1.0)
Monocytes Relative: 8.3 % (ref 3.0–12.0)
NEUTROS ABS: 2.8 10*3/uL (ref 1.4–7.7)
Neutrophils Relative %: 47.7 % (ref 43.0–77.0)
PLATELETS: 183 10*3/uL (ref 150.0–400.0)
RBC: 5.06 Mil/uL (ref 4.22–5.81)
RDW: 17.4 % — AB (ref 11.5–15.5)
WBC: 5.8 10*3/uL (ref 4.0–10.5)

## 2017-08-16 LAB — URINALYSIS, ROUTINE W REFLEX MICROSCOPIC
BILIRUBIN URINE: NEGATIVE
Ketones, ur: NEGATIVE
Nitrite: POSITIVE — AB
Specific Gravity, Urine: 1.025 (ref 1.000–1.030)
Total Protein, Urine: NEGATIVE
Urine Glucose: NEGATIVE
Urobilinogen, UA: 0.2 (ref 0.0–1.0)
pH: 6.5 (ref 5.0–8.0)

## 2017-08-16 LAB — VITAMIN B12: VITAMIN B 12: 475 pg/mL (ref 211–911)

## 2017-08-16 LAB — BASIC METABOLIC PANEL
BUN: 25 mg/dL — ABNORMAL HIGH (ref 6–23)
CHLORIDE: 100 meq/L (ref 96–112)
CO2: 28 meq/L (ref 19–32)
CREATININE: 1.26 mg/dL (ref 0.40–1.50)
Calcium: 9.6 mg/dL (ref 8.4–10.5)
GFR: 59.81 mL/min — ABNORMAL LOW (ref >60.00)
Glucose, Bld: 88 mg/dL (ref 70–99)
POTASSIUM: 4.5 meq/L (ref 3.5–5.1)
Sodium: 135 mEq/L (ref 135–145)

## 2017-08-16 LAB — HEPATIC FUNCTION PANEL
ALBUMIN: 3.9 g/dL (ref 3.5–5.2)
ALK PHOS: 69 U/L (ref 39–117)
ALT: 23 U/L (ref 0–53)
AST: 20 U/L (ref 0–37)
BILIRUBIN DIRECT: 0.1 mg/dL (ref 0.0–0.3)
Total Bilirubin: 0.6 mg/dL (ref 0.2–1.2)
Total Protein: 7.6 g/dL (ref 6.0–8.3)

## 2017-08-16 LAB — TSH: TSH: 1.71 u[IU]/mL (ref 0.35–4.50)

## 2017-08-16 LAB — VITAMIN D 25 HYDROXY (VIT D DEFICIENCY, FRACTURES): VITD: 56.45 ng/mL (ref 30.00–100.00)

## 2017-08-16 LAB — TESTOSTERONE: TESTOSTERONE: 505.49 ng/dL (ref 300.00–890.00)

## 2017-08-16 LAB — PSA: PSA: 1.85 ng/mL (ref 0.10–4.00)

## 2017-08-16 MED ORDER — DOXYCYCLINE HYCLATE 100 MG PO TABS: 100.0000 mg | ORAL_TABLET | Freq: Every day | ORAL | 3 refills | Status: DC

## 2017-08-16 MED ORDER — LEVOTHYROXINE SODIUM 200 MCG PO TABS: 200.0000 ug | ORAL_TABLET | Freq: Every day | ORAL | 3 refills | Status: DC

## 2017-08-16 MED ORDER — DULOXETINE HCL 60 MG PO CPEP: 60.0000 mg | ORAL_CAPSULE | Freq: Two times a day (BID) | ORAL | 3 refills | Status: DC

## 2017-08-16 MED ORDER — TESTOSTERONE CYPIONATE 200 MG/ML IM SOLN: INTRAMUSCULAR | 5 refills | Status: DC

## 2017-08-16 MED ORDER — ALFUZOSIN HCL ER 10 MG PO TB24: 10.0000 mg | ORAL_TABLET | Freq: Every day | ORAL | 3 refills | Status: DC

## 2017-08-16 MED ORDER — CLONAZEPAM 1 MG PO TABS: ORAL_TABLET | ORAL | 1 refills | Status: DC

## 2017-08-16 MED ORDER — RIVAROXABAN 10 MG PO TABS: 10.0000 mg | ORAL_TABLET | Freq: Every day | ORAL | 3 refills | Status: DC

## 2017-08-16 MED ORDER — BUPROPION HCL ER (XL) 300 MG PO TB24: 300.0000 mg | ORAL_TABLET | Freq: Every day | ORAL | 3 refills | Status: DC

## 2017-08-16 MED ORDER — DILTIAZEM HCL ER COATED BEADS 240 MG PO CP24: 240.0000 mg | ORAL_CAPSULE | Freq: Every day | ORAL | 3 refills | Status: DC

## 2017-08-16 MED ORDER — LEVOTHYROXINE SODIUM 25 MCG PO TABS: 25.0000 ug | ORAL_TABLET | Freq: Every day | ORAL | 3 refills | Status: DC

## 2017-08-16 MED ORDER — ATORVASTATIN CALCIUM 80 MG PO TABS: 80.0000 mg | ORAL_TABLET | ORAL | 3 refills | Status: DC

## 2017-08-16 MED ORDER — OMEPRAZOLE 40 MG PO CPDR: 40.0000 mg | DELAYED_RELEASE_CAPSULE | Freq: Every day | ORAL | 3 refills | Status: DC

## 2017-08-16 MED ORDER — HYDROCODONE-ACETAMINOPHEN 7.5-325 MG PO TABS: 1.0000 | ORAL_TABLET | Freq: Four times a day (QID) | ORAL | 0 refills | Status: DC | PRN

## 2017-08-16 MED ORDER — TIZANIDINE HCL 4 MG PO TABS
4.0000 mg | ORAL_TABLET | Freq: Four times a day (QID) | ORAL | 1 refills | Status: DC | PRN
Start: 2017-08-16 — End: 2017-11-22

## 2017-08-16 MED ORDER — TIZANIDINE HCL 4 MG PO TABS
4.0000 mg | ORAL_TABLET | Freq: Four times a day (QID) | ORAL | 0 refills | Status: DC | PRN
Start: 2017-08-16 — End: 2017-08-16

## 2017-08-16 NOTE — Progress Notes (Signed)
Subjective:  Patient ID: [name], male    DOB: 21-Feb-1946  Age: 71 y.o. MRN: [id number]  CC: No chief complaint on file.   HPI [name] presents for a a new pt visit Severe osteoporosis; he was on Forteo x 2 years, now on Prolia and Testosterone inj x 5 years R hip fx - re-do procedure R wrist fx On Doxy for gum infection x years L TKR  C/o A fib - chronic  Outpatient Medications Prior to Visit  Medication Sig Dispense Refill  . alfuzosin (UROXATRAL) 10 MG 24 hr tablet Take 10 mg by mouth at bedtime.    [name] amoxicillin (AMOXIL) 500 MG capsule Take 2,000 mg by mouth See admin instructions. Takes 4 capsules 1 hour prior to dental procedures    . atorvastatin (LIPITOR) 80 MG tablet Take 80 mg by mouth 2 (two) times a week. Monday & Friday evenings ONLY    . buPROPion (WELLBUTRIN XL) 300 MG 24 hr tablet Take 300 mg by mouth daily with breakfast.     . Calcium Citrate-Vitamin D (CALCIUM CITRATE + D PO) Take 1 tablet by mouth 2 (two) times daily.     . Cholecalciferol (VITAMIN D) 2000 units CAPS Take 2,000 Units by mouth 2 (two) times daily.    . clonazePAM (KLONOPIN) 1 MG tablet Take one tablet by mouth every 6 hours (Patient taking differently: Take 1 mg by mouth every 6 (six) hours as needed for anxiety. ) 120 tablet 0  . denosumab (PROLIA) 60 MG/ML SOLN injection Inject 60 mg into the skin every 6 (six) months. Administer in upper arm, thigh, or abdomen    . diltiazem (CARDIZEM CD) 240 MG 24 hr capsule Take 240 mg by mouth daily.     [name] doxycycline (VIBRA-TABS) 100 MG tablet Take 100 mg by mouth daily.    . DULoxetine (CYMBALTA) 60 MG capsule Take 60 mg by mouth 2 (two) times daily.     [name] EPINEPHrine (EPIPEN 2-PAK) 0.3 mg/0.3 mL IJ SOAJ injection Inject 0.3 mg into the muscle daily as needed (for anaphylactic reaction).    [name] etanercept (ENBREL) 50 MG/ML injection Inject 50 mg into the skin 2 (two) times a week. Tuesday & Friday    . Ferrous Gluconate (IRON 27 PO) Take 1  tablet by mouth 2 (two) times daily.     [name] levothyroxine (SYNTHROID, LEVOTHROID) 200 MCG tablet Take 200 mcg by mouth daily at 6 (six) AM. (0500)    . levothyroxine (SYNTHROID, LEVOTHROID) 25 MCG tablet Take 25 mcg by mouth daily before breakfast.    . loratadine (CLARITIN) 10 MG tablet Take 10 mg by mouth daily.     . Multiple Vitamin (MULTIVITAMIN WITH MINERALS) TABS tablet Take 1 tablet by mouth daily. Men's One-A-Day 17+    . nitrofurantoin (MACRODANTIN) 100 MG capsule Take 100 mg by mouth 2 (two) times daily.  0  . nitroGLYCERIN (NITROSTAT) 0.4 MG SL tablet Place 0.4 mg under the tongue every 5 (five) minutes as needed for chest pain. x3 doses as needed for chest pain    . omeprazole (PRILOSEC) 40 MG capsule Take 40 mg by mouth daily before breakfast.     . Oxycodone-Acetaminophen ER 7.5-325 MG TBCR     . polyethylene glycol (MIRALAX / GLYCOLAX) packet Take 17 g by mouth daily as needed for mild constipation.     . Probiotic Product (PROBIOTIC PO) Take 1 capsule by mouth daily with breakfast.    . rivaroxaban (  XARELTO) 10 MG TABS tablet Take 1 tablet (10 mg total) by mouth daily. 30 tablet 0  . testosterone cypionate (DEPOTESTOSTERONE CYPIONATE) 200 MG/ML injection Inject 200mg  (49ml) intramuscularly every 14 days (Patient taking differently: Inject 200 mg into the muscle every 14 (fourteen) days. Inject 200mg  (20ml) intramuscularly every 14 days) 10 mL 0  . tiZANidine (ZANAFLEX) 4 MG tablet Take 1 tablet (4 mg total) by mouth every 6 (six) hours as needed for muscle spasms. 60 tablet 0  . vitamin C (ASCORBIC ACID) 500 MG tablet Take 500 mg by mouth daily.    [name] oxyCODONE-acetaminophen (PERCOCET/ROXICET) 5-325 MG tablet Take 1-2 tablets by mouth every 4 (four) hours as needed for moderate pain. 30 tablet 0   No facility-administered medications prior to visit.     ROS Review of Systems  Constitutional: Negative for appetite change, fatigue and unexpected weight change.  HENT: Negative for  congestion, nosebleeds, sneezing, sore throat and trouble swallowing.   Eyes: Negative for itching and visual disturbance.  Respiratory: Negative for cough.   Cardiovascular: Negative for chest pain, palpitations and leg swelling.  Gastrointestinal: Negative for abdominal distention, blood in stool, diarrhea and nausea.  Genitourinary: Negative for frequency and hematuria.  Musculoskeletal: Positive for arthralgias, back pain and gait problem. Negative for joint swelling and neck pain.  Skin: Negative for rash.  Neurological: Negative for dizziness, tremors, speech difficulty and weakness.  Psychiatric/Behavioral: Negative for agitation, dysphoric mood, sleep disturbance and suicidal ideas. The patient is not nervous/anxious.     Objective:  BP 136/72 (BP Location: Left Arm, Patient Position: Sitting, Cuff Size: Large)   Pulse 84   Temp 98.7 F (37.1 C) (Oral)   Ht 6\' 4"  (1.93 m)   Wt 291 lb (132 kg)   SpO2 98%   BMI 35.42 kg/m   BP Readings from Last 3 Encounters:  08/16/17 136/72  07/16/17 121/71  07/12/17 (!) 138/54    Wt Readings from Last 3 Encounters:  08/16/17 291 lb (132 kg)  07/15/17 290 lb (131.5 kg)  06/08/17 293 lb 9.6 oz (133.2 kg)    Physical Exam  Constitutional: He is oriented to person, place, and time. He appears well-developed. No distress.  NAD  HENT:  Mouth/Throat: Oropharynx is clear and moist.  Eyes: Conjunctivae are normal. Pupils are equal, round, and reactive to light.  Neck: Normal range of motion. No JVD present. No thyromegaly present.  Cardiovascular: Normal rate, regular rhythm, normal heart sounds and intact distal pulses. Exam reveals no gallop and no friction rub.  No murmur heard. Pulmonary/Chest: Effort normal and breath sounds normal. No respiratory distress. He has no wheezes. He has no rales. He exhibits no tenderness.  Abdominal: Soft. Bowel sounds are normal. He exhibits no distension and no mass. There is no tenderness. There is  no rebound and no guarding.  Musculoskeletal: Normal range of motion. He exhibits tenderness. He exhibits no edema.  Lymphadenopathy:    He has no cervical adenopathy.  Neurological: He is alert and oriented to person, place, and time. He has normal reflexes. No cranial nerve deficit. He exhibits normal muscle tone. He displays a negative Romberg sign. Coordination abnormal. Gait normal.  Skin: Skin is warm and dry. No rash noted.  Psychiatric: His behavior is normal. Judgment and thought content normal.  obese  Lab Results  Component Value Date   WBC 6.1 07/12/2017   HGB 14.8 07/12/2017   HCT 45.4 07/12/2017   PLT 157 07/12/2017   GLUCOSE 136 (H) 07/12/2017  CHOL 116 03/23/2016   TRIG 87 03/23/2016   HDL 40 03/23/2016   LDLCALC 59 03/23/2016   ALT 40 11/02/2016   AST 45 (H) 11/02/2016   NA 135 07/12/2017   K 4.3 07/12/2017   CL 101 07/12/2017   CREATININE 1.16 07/12/2017   BUN 18 07/12/2017   CO2 28 07/12/2017   TSH 0.52 05/03/2016   INR 1.10 07/15/2017   HGBA1C 5.8 (H) 02/06/2013    No results found.  Assessment & Plan:   There are no diagnoses linked to this encounter. I have discontinued Kalel Harty. Gil's oxyCODONE-acetaminophen. I am also having him maintain his atorvastatin, loratadine, DULoxetine, polyethylene glycol, diltiazem, nitroGLYCERIN, multivitamin with minerals, levothyroxine, buPROPion, omeprazole, testosterone cypionate, etanercept, denosumab, clonazePAM, doxycycline, EPINEPHrine, vitamin C, Calcium Citrate-Vitamin D (CALCIUM CITRATE + D PO), Vitamin D, Probiotic Product (PROBIOTIC PO), amoxicillin, Ferrous Gluconate (IRON 27 PO), alfuzosin, nitrofurantoin, levothyroxine, tiZANidine, rivaroxaban, and Oxycodone-Acetaminophen ER.  Meds ordered this encounter  Medications  . Oxycodone-Acetaminophen ER 7.5-325 MG TBCR     Follow-up: No Follow-up on file.  Walker Kehr, MD

## 2017-08-16 NOTE — Assessment & Plan Note (Signed)
Labs Xarelto

## 2017-08-16 NOTE — Assessment & Plan Note (Signed)
Lipitor 

## 2017-08-16 NOTE — Assessment & Plan Note (Signed)
Labs

## 2017-08-16 NOTE — Patient Instructions (Addendum)
Use [employer] Peroxicare tooth paste after each meal

## 2017-08-16 NOTE — Assessment & Plan Note (Signed)
CBC

## 2017-08-21 ENCOUNTER — Encounter: Payer: Self-pay | Admitting: Internal Medicine

## 2017-08-21 NOTE — Assessment & Plan Note (Signed)
Labs

## 2017-08-23 ENCOUNTER — Encounter: Payer: Self-pay | Admitting: Internal Medicine

## 2017-08-23 ENCOUNTER — Telehealth: Payer: Self-pay | Admitting: Internal Medicine

## 2017-08-23 NOTE — Telephone Encounter (Signed)
Pt sent nychart msg.. Responded back via msg needing to know correct pharmacy he want rx sent...[name]

## 2017-08-23 NOTE — Telephone Encounter (Signed)
Called [facility] back spoke w/[name] she states not able to locate pt in their system. Will call pt to verify msg below...[name]

## 2017-08-23 NOTE — Telephone Encounter (Signed)
States patient called in for testosterone.  Is requesting call back.  Would like to know what kind of testosterone patient needs before script is sent b/c script may not need to go to specialty pharmacy.

## 2017-08-24 MED ORDER — TESTOSTERONE CYPIONATE 200 MG/ML IM SOLN: INTRAMUSCULAR | 5 refills | Status: DC

## 2017-08-24 NOTE — Addendum Note (Signed)
Addended by: Aviva Signs M on: 08/24/2017 11:40 AM   Modules accepted: Orders

## 2017-08-24 NOTE — Telephone Encounter (Signed)
[facility] - states pharmacy does have script.  States he does not believe anything else needs to be done.

## 2017-08-24 NOTE — Telephone Encounter (Signed)
Called pt to verify which pharmacy he uses... Pt was not sure he states his wife [name]) takes care of all his prescriptions. Requesting [place] to contact him. Called Mr. Marcille Buffy [phone] no answer LMOM RTC needing to know which pharmacy pt uses.../lm,b

## 2017-08-24 NOTE — Telephone Encounter (Signed)
See previous msg... Per [name] [facility] received script and it is being processing to be ship...[name]

## 2017-08-30 ENCOUNTER — Encounter: Payer: Self-pay | Admitting: Internal Medicine

## 2017-08-30 NOTE — Telephone Encounter (Signed)
Pls advise if ok to add and send refills on belsoma...[name]

## 2017-09-06 ENCOUNTER — Other Ambulatory Visit: Payer: Self-pay | Admitting: Internal Medicine

## 2017-09-06 MED ORDER — SUVOREXANT 15 MG PO TABS: 15.0000 mg | ORAL_TABLET | Freq: Every evening | ORAL | 1 refills | Status: DC | PRN

## 2017-09-08 ENCOUNTER — Encounter: Payer: Self-pay | Admitting: Internal Medicine

## 2017-09-12 MED ORDER — TESTOSTERONE CYPIONATE 200 MG/ML IM SOLN: INTRAMUSCULAR | 5 refills | Status: DC

## 2017-09-21 ENCOUNTER — Other Ambulatory Visit: Payer: Self-pay | Admitting: Urology

## 2017-09-21 ENCOUNTER — Telehealth: Payer: Self-pay | Admitting: Internal Medicine

## 2017-09-21 NOTE — Telephone Encounter (Signed)
Copied from [place]. Topic: Quick Communication - See Telephone Encounter >> Sep 21, 2017  4:04 PM Boyd Kerbs wrote: CRM for notification. See Telephone encounter for:  [facility] from Urology Dr. Regino Bellow [phone] ext [phone]  needs surgery clearance. Asking if needs appt. Or can you send info to them  09/21/17.

## 2017-09-21 NOTE — Telephone Encounter (Signed)
LM asking them to fax [place] the form

## 2017-09-22 ENCOUNTER — Other Ambulatory Visit: Payer: Self-pay | Admitting: Urology

## 2017-09-22 DIAGNOSIS — N200 Calculus of kidney: Secondary | ICD-10-CM

## 2017-10-13 ENCOUNTER — Encounter (HOSPITAL_COMMUNITY): Payer: Self-pay

## 2017-10-13 NOTE — Pre-Procedure Instructions (Signed)
The following are in epic: EKG 8/18 Echo 11/03/16 CXR 3/318 Last office note from Dr. Alain Marion 08/16/17 Last office note from Dr. Oval Linsey 06/08/17

## 2017-10-13 NOTE — Patient Instructions (Addendum)
Your procedure is scheduled on: Monday, Jan. 14, 2018   Surgery Time:  11:45AM-1:45PM   Report to [facility]  Entrance    Report to admitting at 9:45 AM    Call this number if you have problems the morning of surgery [phone]    Bring CPAP mask and tubing day of surgery   Do not eat food or drink liquids :After Midnight.   Do NOT smoke after Midnight   Take these medicines the morning of surgery with A SIP OF WATER: Levothyroxine, Loratadine, Omeprazole, Wellbutrin, Diltiazem, Duloxetine, Clonazepam if needed                               You may not have any metal on your body including jewelry, and body piercings             Do not wear lotions, powders, perfumes/cologne, or deodorant             Men may shave face and neck.   Do not bring valuables to the hospital. [place].   Contacts, dentures or bridgework may not be worn into surgery.   Leave suitcase in the car. After surgery it may be brought to your room.   Special Instructions: Bring a copy of your healthcare power of attorney and living will documents         the day of surgery if you haven't scanned them in before.              Please read over the following fact sheets you were given:  [facility] - Preparing for Surgery Before surgery, you can play an important role.  Because skin is not sterile, your skin needs to be as free of germs as possible.  You can reduce the number of germs on your skin by washing with CHG (chlorahexidine gluconate) soap before surgery.  CHG is an antiseptic cleaner which kills germs and bonds with the skin to continue killing germs even after washing. Please DO NOT use if you have an allergy to CHG or antibacterial soaps.  If your skin becomes reddened/irritated stop using the CHG and inform your nurse when you arrive at Short Stay. Do not shave (including legs and underarms) for at least 48 hours prior to the first  CHG shower.  You may shave your face/neck.  Please follow these instructions carefully:  1.  Shower with CHG Soap the night before surgery and the  morning of surgery.  2.  If you choose to wash your hair, wash your hair first as usual with your normal  shampoo.  3.  After you shampoo, rinse your hair and body thoroughly to remove the shampoo.                             4.  Use CHG as you would any other liquid soap.  You can apply chg directly to the skin and wash.  Gently with a scrungie or clean washcloth.  5.  Apply the CHG Soap to your body ONLY FROM THE NECK DOWN.   Do   not use on face/ open  Wound or open sores. Avoid contact with eyes, ears mouth and   genitals (private parts).                       Wash face,  Genitals (private parts) with your normal soap.             6.  Wash thoroughly, paying special attention to the area where your    surgery  will be performed.  7.  Thoroughly rinse your body with warm water from the neck down.  8.  DO NOT shower/wash with your normal soap after using and rinsing off the CHG Soap.                9.  Pat yourself dry with a clean towel.            10.  Wear clean pajamas.            11.  Place clean sheets on your bed the night of your first shower and do not  sleep with pets. Day of Surgery : Do not apply any lotions/deodorants the morning of surgery.  Please wear clean clothes to the hospital/surgery center.  FAILURE TO FOLLOW THESE INSTRUCTIONS MAY RESULT IN THE CANCELLATION OF YOUR SURGERY  PATIENT SIGNATURE_________________________________  NURSE SIGNATURE__________________________________  ________________________________________________________________________

## 2017-10-17 ENCOUNTER — Other Ambulatory Visit: Payer: Self-pay

## 2017-10-17 ENCOUNTER — Encounter (HOSPITAL_COMMUNITY)
Admission: RE | Admit: 2017-10-17 | Discharge: 2017-10-17 | Disposition: A | Discharge status: Home / Self Care | Payer: Medicare Other | Source: Ambulatory Visit | Attending: Urology | Admitting: Urology

## 2017-10-17 ENCOUNTER — Encounter (HOSPITAL_COMMUNITY): Payer: Self-pay

## 2017-10-17 DIAGNOSIS — N200 Calculus of kidney: Secondary | ICD-10-CM | POA: Insufficient documentation

## 2017-10-17 DIAGNOSIS — Z01818 Encounter for other preprocedural examination: Secondary | ICD-10-CM | POA: Diagnosis present

## 2017-10-17 HISTORY — DX: Repeated falls: R29.6

## 2017-10-17 HISTORY — DX: Cardiomegaly: I51.7

## 2017-10-17 HISTORY — DX: Urinary tract infection, site not specified: N39.0

## 2017-10-17 HISTORY — DX: Acute kidney failure, unspecified: N17.9

## 2017-10-17 HISTORY — DX: Personal history of other endocrine, nutritional and metabolic disease: Z86.39

## 2017-10-17 HISTORY — DX: Personal history of other specified conditions: Z87.898

## 2017-10-17 HISTORY — DX: Other specified abnormal findings of blood chemistry: R79.89

## 2017-10-17 LAB — BASIC METABOLIC PANEL
ANION GAP: 5 (ref 5–15)
BUN: 23 mg/dL — ABNORMAL HIGH (ref 6–20)
CALCIUM: 8.8 mg/dL — AB (ref 8.9–10.3)
CO2: 28 mmol/L (ref 22–32)
Chloride: 102 mmol/L (ref 101–111)
Creatinine, Ser: 1.18 mg/dL (ref 0.61–1.24)
GFR calc Af Amer: >60 mL/min (ref >60)
GFR, EST NON AFRICAN AMERICAN: 60 mL/min — AB (ref >60)
GLUCOSE: 112 mg/dL — AB (ref 65–99)
POTASSIUM: 4.3 mmol/L (ref 3.5–5.1)
SODIUM: 135 mmol/L (ref 135–145)

## 2017-10-17 LAB — CBC
HCT: 44.1 % (ref 39.0–52.0)
HEMOGLOBIN: 14.5 g/dL (ref 13.0–17.0)
MCH: 30.6 pg (ref 26.0–34.0)
MCHC: 32.9 g/dL (ref 30.0–36.0)
MCV: 93 fL (ref 78.0–100.0)
Platelets: 143 10*3/uL — ABNORMAL LOW (ref 150–400)
RBC: 4.74 MIL/uL (ref 4.22–5.81)
RDW: 16 % — AB (ref 11.5–15.5)
WBC: 5 10*3/uL (ref 4.0–10.5)

## 2017-10-17 NOTE — Pre-Procedure Instructions (Signed)
CBC and BMP results 10/17/17 faxed to Dr. Alyson Ingles via epic.

## 2017-10-21 ENCOUNTER — Other Ambulatory Visit: Payer: Self-pay | Admitting: General Surgery

## 2017-10-21 MED ORDER — CEFAZOLIN SODIUM 1 G IJ SOLR: 2.0000 g | INTRAMUSCULAR | Status: AC

## 2017-10-24 ENCOUNTER — Ambulatory Visit (HOSPITAL_COMMUNITY)
Admission: RE | Admit: 2017-10-24 | Discharge: 2017-10-24 | Disposition: A | Discharge status: Home / Self Care | Payer: Medicare Other | Source: Ambulatory Visit | Attending: Urology | Admitting: Urology

## 2017-10-24 ENCOUNTER — Other Ambulatory Visit: Payer: Self-pay

## 2017-10-24 ENCOUNTER — Ambulatory Visit (HOSPITAL_COMMUNITY): Payer: Medicare Other | Admitting: Certified Registered Nurse Anesthetist

## 2017-10-24 ENCOUNTER — Encounter (HOSPITAL_COMMUNITY): Payer: Self-pay

## 2017-10-24 ENCOUNTER — Encounter (HOSPITAL_COMMUNITY): Payer: Self-pay | Admitting: Certified Registered Nurse Anesthetist

## 2017-10-24 ENCOUNTER — Ambulatory Visit (HOSPITAL_COMMUNITY): Payer: Medicare Other

## 2017-10-24 ENCOUNTER — Observation Stay (HOSPITAL_COMMUNITY)
Admission: RE | Admit: 2017-10-24 | Discharge: 2017-10-25 | Disposition: A | Discharge status: Home / Self Care | Payer: Medicare Other | Source: Ambulatory Visit | Attending: Urology | Admitting: Urology

## 2017-10-24 ENCOUNTER — Encounter (HOSPITAL_COMMUNITY)
Admission: RE | Disposition: A | Discharge status: Home / Self Care | Payer: Self-pay | Source: Ambulatory Visit | Attending: Urology

## 2017-10-24 DIAGNOSIS — E7800 Pure hypercholesterolemia, unspecified: Secondary | ICD-10-CM | POA: Diagnosis not present

## 2017-10-24 DIAGNOSIS — Z7901 Long term (current) use of anticoagulants: Secondary | ICD-10-CM | POA: Diagnosis not present

## 2017-10-24 DIAGNOSIS — Z79899 Other long term (current) drug therapy: Secondary | ICD-10-CM | POA: Diagnosis not present

## 2017-10-24 DIAGNOSIS — G4730 Sleep apnea, unspecified: Secondary | ICD-10-CM | POA: Insufficient documentation

## 2017-10-24 DIAGNOSIS — Z825 Family history of asthma and other chronic lower respiratory diseases: Secondary | ICD-10-CM | POA: Diagnosis not present

## 2017-10-24 DIAGNOSIS — F329 Major depressive disorder, single episode, unspecified: Secondary | ICD-10-CM | POA: Diagnosis not present

## 2017-10-24 DIAGNOSIS — Z79890 Hormone replacement therapy (postmenopausal): Secondary | ICD-10-CM | POA: Diagnosis not present

## 2017-10-24 DIAGNOSIS — E785 Hyperlipidemia, unspecified: Secondary | ICD-10-CM | POA: Insufficient documentation

## 2017-10-24 DIAGNOSIS — Z8249 Family history of ischemic heart disease and other diseases of the circulatory system: Secondary | ICD-10-CM | POA: Insufficient documentation

## 2017-10-24 DIAGNOSIS — Z8261 Family history of arthritis: Secondary | ICD-10-CM | POA: Diagnosis not present

## 2017-10-24 DIAGNOSIS — Z87442 Personal history of urinary calculi: Secondary | ICD-10-CM | POA: Diagnosis not present

## 2017-10-24 DIAGNOSIS — Z936 Other artificial openings of urinary tract status: Secondary | ICD-10-CM | POA: Insufficient documentation

## 2017-10-24 DIAGNOSIS — I4891 Unspecified atrial fibrillation: Secondary | ICD-10-CM | POA: Insufficient documentation

## 2017-10-24 DIAGNOSIS — E0500 Thyrotoxicosis with diffuse goiter without thyrotoxic crisis or storm: Secondary | ICD-10-CM | POA: Diagnosis not present

## 2017-10-24 DIAGNOSIS — Z87891 Personal history of nicotine dependence: Secondary | ICD-10-CM | POA: Diagnosis not present

## 2017-10-24 DIAGNOSIS — I10 Essential (primary) hypertension: Secondary | ICD-10-CM | POA: Insufficient documentation

## 2017-10-24 DIAGNOSIS — F419 Anxiety disorder, unspecified: Secondary | ICD-10-CM | POA: Insufficient documentation

## 2017-10-24 DIAGNOSIS — Z96641 Presence of right artificial hip joint: Secondary | ICD-10-CM | POA: Insufficient documentation

## 2017-10-24 DIAGNOSIS — K219 Gastro-esophageal reflux disease without esophagitis: Secondary | ICD-10-CM | POA: Insufficient documentation

## 2017-10-24 DIAGNOSIS — E039 Hypothyroidism, unspecified: Secondary | ICD-10-CM | POA: Insufficient documentation

## 2017-10-24 DIAGNOSIS — N200 Calculus of kidney: Principal | ICD-10-CM

## 2017-10-24 DIAGNOSIS — I2510 Atherosclerotic heart disease of native coronary artery without angina pectoris: Secondary | ICD-10-CM | POA: Diagnosis not present

## 2017-10-24 DIAGNOSIS — M1990 Unspecified osteoarthritis, unspecified site: Secondary | ICD-10-CM | POA: Insufficient documentation

## 2017-10-24 DIAGNOSIS — Z811 Family history of alcohol abuse and dependence: Secondary | ICD-10-CM | POA: Diagnosis not present

## 2017-10-24 HISTORY — PX: IR URETERAL STENT LEFT NEW ACCESS W/O SEP NEPHROSTOMY CATH: IMG6075

## 2017-10-24 HISTORY — PX: NEPHROLITHOTOMY: SHX5134

## 2017-10-24 LAB — BASIC METABOLIC PANEL
ANION GAP: 4 — AB (ref 5–15)
BUN: 19 mg/dL (ref 6–20)
CHLORIDE: 105 mmol/L (ref 101–111)
CO2: 28 mmol/L (ref 22–32)
Calcium: 8.5 mg/dL — ABNORMAL LOW (ref 8.9–10.3)
Creatinine, Ser: 1.21 mg/dL (ref 0.61–1.24)
GFR calc Af Amer: >60 mL/min (ref >60)
GFR calc non Af Amer: 58 mL/min — ABNORMAL LOW (ref >60)
Glucose, Bld: 130 mg/dL — ABNORMAL HIGH (ref 65–99)
POTASSIUM: 4.2 mmol/L (ref 3.5–5.1)
SODIUM: 137 mmol/L (ref 135–145)

## 2017-10-24 LAB — PROTIME-INR
INR: 1.01
PROTHROMBIN TIME: 13.2 s (ref 11.4–15.2)

## 2017-10-24 LAB — CBC
HCT: 42.3 % (ref 39.0–52.0)
HEMOGLOBIN: 13.7 g/dL (ref 13.0–17.0)
MCH: 30 pg (ref 26.0–34.0)
MCHC: 32.4 g/dL (ref 30.0–36.0)
MCV: 92.8 fL (ref 78.0–100.0)
PLATELETS: 148 10*3/uL — AB (ref 150–400)
RBC: 4.56 MIL/uL (ref 4.22–5.81)
RDW: 15.5 % (ref 11.5–15.5)
WBC: 5.1 10*3/uL (ref 4.0–10.5)

## 2017-10-24 LAB — GLUCOSE, CAPILLARY: Glucose-Capillary: 120 mg/dL — ABNORMAL HIGH (ref 65–99)

## 2017-10-24 SURGERY — NEPHROLITHOTOMY PERCUTANEOUS
Anesthesia: General | Laterality: Left

## 2017-10-24 MED ORDER — PROPOFOL 10 MG/ML IV BOLUS
INTRAVENOUS | Status: DC | PRN
  Administered 2017-10-24: 200 mg via INTRAVENOUS

## 2017-10-24 MED ORDER — BELLADONNA ALKALOIDS-OPIUM 16.2-60 MG RE SUPP: 1.0000 | Freq: Four times a day (QID) | RECTAL | Status: DC | PRN

## 2017-10-24 MED ORDER — PANTOPRAZOLE SODIUM 40 MG PO TBEC
40.0000 mg | DELAYED_RELEASE_TABLET | Freq: Every day | ORAL | Status: DC
  Administered 2017-10-25: 40 mg via ORAL
  Filled 2017-10-24: qty 1

## 2017-10-24 MED ORDER — DILTIAZEM HCL ER COATED BEADS 240 MG PO CP24
240.0000 mg | ORAL_CAPSULE | Freq: Every day | ORAL | Status: DC
  Administered 2017-10-25: 240 mg via ORAL
  Filled 2017-10-24: qty 1

## 2017-10-24 MED ORDER — CLONAZEPAM 1 MG PO TABS
1.0000 mg | ORAL_TABLET | Freq: Four times a day (QID) | ORAL | Status: DC | PRN
  Administered 2017-10-24 – 2017-10-25 (×2): 1 mg via ORAL
  Filled 2017-10-24 (×2): qty 1

## 2017-10-24 MED ORDER — SUGAMMADEX SODIUM 200 MG/2ML IV SOLN
INTRAVENOUS | Status: AC
  Filled 2017-10-24: qty 2

## 2017-10-24 MED ORDER — ROCURONIUM BROMIDE 10 MG/ML (PF) SYRINGE
PREFILLED_SYRINGE | INTRAVENOUS | Status: DC | PRN
  Administered 2017-10-24: 47 mg via INTRAVENOUS
  Administered 2017-10-24: 3 mg via INTRAVENOUS

## 2017-10-24 MED ORDER — ALFUZOSIN HCL ER 10 MG PO TB24
10.0000 mg | ORAL_TABLET | Freq: Every day | ORAL | Status: DC
  Administered 2017-10-24: 10 mg via ORAL
  Filled 2017-10-24: qty 1

## 2017-10-24 MED ORDER — LIDOCAINE HCL 1 % IJ SOLN
INTRAMUSCULAR | Status: AC | PRN
  Administered 2017-10-24: 10 mL
  Administered 2017-10-24: 20 mL
  Administered 2017-10-24: 10 mL

## 2017-10-24 MED ORDER — MIDAZOLAM HCL 5 MG/5ML IJ SOLN
INTRAMUSCULAR | Status: DC | PRN
  Administered 2017-10-24: 2 mg via INTRAVENOUS

## 2017-10-24 MED ORDER — ACETAMINOPHEN 10 MG/ML IV SOLN
INTRAVENOUS | Status: AC
  Filled 2017-10-24: qty 100

## 2017-10-24 MED ORDER — CEFAZOLIN SODIUM-DEXTROSE 2-4 GM/100ML-% IV SOLN
2.0000 g | Freq: Once | INTRAVENOUS | Status: AC
  Administered 2017-10-24: 2 g via INTRAVENOUS

## 2017-10-24 MED ORDER — SODIUM CHLORIDE 0.9 % IV SOLN: INTRAVENOUS | Status: DC

## 2017-10-24 MED ORDER — DIPHENHYDRAMINE HCL 12.5 MG/5ML PO ELIX
12.5000 mg | ORAL_SOLUTION | Freq: Four times a day (QID) | ORAL | Status: DC | PRN
Start: 2017-10-24 — End: 2017-10-25

## 2017-10-24 MED ORDER — LEVOTHYROXINE SODIUM 100 MCG PO TABS
200.0000 ug | ORAL_TABLET | Freq: Every day | ORAL | Status: DC
  Administered 2017-10-25: 200 ug via ORAL
  Filled 2017-10-24: qty 2

## 2017-10-24 MED ORDER — LACTATED RINGERS IV SOLN
INTRAVENOUS | Status: DC
  Administered 2017-10-24 (×2): via INTRAVENOUS

## 2017-10-24 MED ORDER — LIDOCAINE HCL 1 % IJ SOLN
INTRAMUSCULAR | Status: AC
  Filled 2017-10-24: qty 20

## 2017-10-24 MED ORDER — SUVOREXANT 15 MG PO TABS: 15.0000 mg | ORAL_TABLET | Freq: Every evening | ORAL | Status: DC | PRN

## 2017-10-24 MED ORDER — BUPROPION HCL ER (XL) 300 MG PO TB24
300.0000 mg | ORAL_TABLET | Freq: Every day | ORAL | Status: DC
  Administered 2017-10-25: 300 mg via ORAL
  Filled 2017-10-24: qty 1

## 2017-10-24 MED ORDER — MIDAZOLAM HCL 2 MG/2ML IJ SOLN
INTRAMUSCULAR | Status: AC | PRN
  Administered 2017-10-24 (×3): 1 mg via INTRAVENOUS
  Administered 2017-10-24: 2 mg via INTRAVENOUS
  Administered 2017-10-24: 1 mg via INTRAVENOUS

## 2017-10-24 MED ORDER — IOPAMIDOL (ISOVUE-300) INJECTION 61%
75.0000 mL | Freq: Once | INTRAVENOUS | Status: AC | PRN
  Administered 2017-10-24: 75 mL via INTRAVENOUS

## 2017-10-24 MED ORDER — HYDROMORPHONE HCL 1 MG/ML IJ SOLN
0.5000 mg | INTRAMUSCULAR | Status: DC | PRN
  Administered 2017-10-24 – 2017-10-25 (×3): 1 mg via INTRAVENOUS
  Filled 2017-10-24 (×4): qty 1

## 2017-10-24 MED ORDER — PHENYLEPHRINE HCL 10 MG/ML IJ SOLN
INTRAMUSCULAR | Status: AC
  Filled 2017-10-24: qty 1

## 2017-10-24 MED ORDER — PHENYLEPHRINE 40 MCG/ML (10ML) SYRINGE FOR IV PUSH (FOR BLOOD PRESSURE SUPPORT)
PREFILLED_SYRINGE | INTRAVENOUS | Status: AC
  Filled 2017-10-24: qty 10

## 2017-10-24 MED ORDER — SUGAMMADEX SODIUM 200 MG/2ML IV SOLN
INTRAVENOUS | Status: DC | PRN
  Administered 2017-10-24: 200 mg via INTRAVENOUS

## 2017-10-24 MED ORDER — DEXTROSE 5 % IV SOLN
2.0000 g | INTRAVENOUS | Status: DC
  Administered 2017-10-25: 2 g via INTRAVENOUS
  Filled 2017-10-24: qty 2

## 2017-10-24 MED ORDER — MIDAZOLAM HCL 2 MG/2ML IJ SOLN
INTRAMUSCULAR | Status: AC
  Filled 2017-10-24: qty 6

## 2017-10-24 MED ORDER — IOPAMIDOL (ISOVUE-300) INJECTION 61%
50.0000 mL | Freq: Once | INTRAVENOUS | Status: AC | PRN
  Administered 2017-10-24: 50 mL

## 2017-10-24 MED ORDER — PHENYLEPHRINE HCL 10 MG/ML IJ SOLN
INTRAVENOUS | Status: DC | PRN
  Administered 2017-10-24: 50 ug/min via INTRAVENOUS

## 2017-10-24 MED ORDER — ONDANSETRON HCL 4 MG/2ML IJ SOLN: 4.0000 mg | INTRAMUSCULAR | Status: DC | PRN

## 2017-10-24 MED ORDER — IOHEXOL 300 MG/ML  SOLN
INTRAMUSCULAR | Status: DC | PRN
  Administered 2017-10-24: 50 mL

## 2017-10-24 MED ORDER — SUCCINYLCHOLINE CHLORIDE 200 MG/10ML IV SOSY
PREFILLED_SYRINGE | INTRAVENOUS | Status: DC | PRN
  Administered 2017-10-24: 160 mg via INTRAVENOUS

## 2017-10-24 MED ORDER — IOPAMIDOL (ISOVUE-300) INJECTION 61%
INTRAVENOUS | Status: AC
  Administered 2017-10-24: 50 mL
  Filled 2017-10-24: qty 50

## 2017-10-24 MED ORDER — OXYCODONE-ACETAMINOPHEN 5-325 MG PO TABS
1.0000 | ORAL_TABLET | ORAL | Status: DC | PRN
  Administered 2017-10-24 – 2017-10-25 (×4): 2 via ORAL
  Filled 2017-10-24 (×4): qty 2

## 2017-10-24 MED ORDER — PHENYLEPHRINE 40 MCG/ML (10ML) SYRINGE FOR IV PUSH (FOR BLOOD PRESSURE SUPPORT)
PREFILLED_SYRINGE | INTRAVENOUS | Status: DC | PRN
  Administered 2017-10-24 (×3): 80 ug via INTRAVENOUS

## 2017-10-24 MED ORDER — LIDOCAINE 2% (20 MG/ML) 5 ML SYRINGE
INTRAMUSCULAR | Status: DC | PRN
  Administered 2017-10-24: 100 mg via INTRAVENOUS

## 2017-10-24 MED ORDER — DEXTROSE 5 % IV SOLN
2.0000 g | INTRAVENOUS | Status: AC
  Administered 2017-10-24: 2 g via INTRAVENOUS
  Filled 2017-10-24: qty 2

## 2017-10-24 MED ORDER — ONDANSETRON HCL 4 MG/2ML IJ SOLN
INTRAMUSCULAR | Status: AC
  Filled 2017-10-24: qty 2

## 2017-10-24 MED ORDER — FENTANYL CITRATE (PF) 100 MCG/2ML IJ SOLN
INTRAMUSCULAR | Status: DC | PRN
  Administered 2017-10-24: 100 ug via INTRAVENOUS

## 2017-10-24 MED ORDER — PROMETHAZINE HCL 25 MG/ML IJ SOLN: 6.2500 mg | INTRAMUSCULAR | Status: DC | PRN

## 2017-10-24 MED ORDER — ZOLPIDEM TARTRATE 5 MG PO TABS
5.0000 mg | ORAL_TABLET | Freq: Every evening | ORAL | Status: DC | PRN
  Administered 2017-10-24: 5 mg via ORAL
  Filled 2017-10-24: qty 1

## 2017-10-24 MED ORDER — HYDROMORPHONE HCL 1 MG/ML IJ SOLN
INTRAMUSCULAR | Status: AC
  Filled 2017-10-24: qty 1

## 2017-10-24 MED ORDER — HYDROMORPHONE HCL 1 MG/ML IJ SOLN
0.2500 mg | INTRAMUSCULAR | Status: DC | PRN
  Administered 2017-10-24: 0.5 mg via INTRAVENOUS

## 2017-10-24 MED ORDER — PROPOFOL 10 MG/ML IV BOLUS
INTRAVENOUS | Status: AC
  Filled 2017-10-24: qty 20

## 2017-10-24 MED ORDER — MEPERIDINE HCL 50 MG/ML IJ SOLN: 6.2500 mg | INTRAMUSCULAR | Status: DC | PRN

## 2017-10-24 MED ORDER — TIZANIDINE HCL 4 MG PO TABS
4.0000 mg | ORAL_TABLET | Freq: Four times a day (QID) | ORAL | Status: DC | PRN
  Administered 2017-10-24 – 2017-10-25 (×2): 4 mg via ORAL
  Filled 2017-10-24 (×2): qty 1

## 2017-10-24 MED ORDER — LORATADINE 10 MG PO TABS
10.0000 mg | ORAL_TABLET | Freq: Every day | ORAL | Status: DC
  Administered 2017-10-25: 10 mg via ORAL
  Filled 2017-10-24: qty 1

## 2017-10-24 MED ORDER — LEVOTHYROXINE SODIUM 25 MCG PO TABS
25.0000 ug | ORAL_TABLET | Freq: Every day | ORAL | Status: DC
  Administered 2017-10-25: 25 ug via ORAL
  Filled 2017-10-24: qty 1

## 2017-10-24 MED ORDER — FENTANYL CITRATE (PF) 100 MCG/2ML IJ SOLN
INTRAMUSCULAR | Status: AC | PRN
  Administered 2017-10-24 (×4): 50 ug via INTRAVENOUS

## 2017-10-24 MED ORDER — CEFAZOLIN SODIUM-DEXTROSE 2-4 GM/100ML-% IV SOLN
INTRAVENOUS | Status: AC
  Filled 2017-10-24: qty 100

## 2017-10-24 MED ORDER — SODIUM CHLORIDE 0.9 % IR SOLN
Status: DC | PRN
  Administered 2017-10-24: 3000 mL

## 2017-10-24 MED ORDER — FENTANYL CITRATE (PF) 100 MCG/2ML IJ SOLN
INTRAMUSCULAR | Status: AC
  Filled 2017-10-24: qty 2

## 2017-10-24 MED ORDER — ATORVASTATIN CALCIUM 40 MG PO TABS
80.0000 mg | ORAL_TABLET | ORAL | Status: DC
  Administered 2017-10-24: 80 mg via ORAL
  Filled 2017-10-24: qty 2

## 2017-10-24 MED ORDER — ACETAMINOPHEN 10 MG/ML IV SOLN
1000.0000 mg | Freq: Once | INTRAVENOUS | Status: DC | PRN
  Administered 2017-10-24: 1000 mg via INTRAVENOUS

## 2017-10-24 MED ORDER — FENTANYL CITRATE (PF) 100 MCG/2ML IJ SOLN
INTRAMUSCULAR | Status: AC
  Filled 2017-10-24: qty 4

## 2017-10-24 MED ORDER — DULOXETINE HCL 60 MG PO CPEP
60.0000 mg | ORAL_CAPSULE | Freq: Two times a day (BID) | ORAL | Status: DC
  Administered 2017-10-24 – 2017-10-25 (×2): 60 mg via ORAL
  Filled 2017-10-24 (×2): qty 1

## 2017-10-24 MED ORDER — IOPAMIDOL (ISOVUE-300) INJECTION 61%
INTRAVENOUS | Status: AC
  Administered 2017-10-24: 75 mL via INTRAVENOUS
  Filled 2017-10-24: qty 100

## 2017-10-24 MED ORDER — ONDANSETRON HCL 4 MG/2ML IJ SOLN
INTRAMUSCULAR | Status: DC | PRN
  Administered 2017-10-24: 4 mg via INTRAVENOUS

## 2017-10-24 MED ORDER — DIPHENHYDRAMINE HCL 50 MG/ML IJ SOLN: 12.5000 mg | Freq: Four times a day (QID) | INTRAMUSCULAR | Status: DC | PRN

## 2017-10-24 MED ORDER — MIDAZOLAM HCL 2 MG/2ML IJ SOLN
INTRAMUSCULAR | Status: AC
  Filled 2017-10-24: qty 2

## 2017-10-24 SURGICAL SUPPLY — 66 items
APL SKNCLS STERI-STRIP NONHPOA (GAUZE/BANDAGES/DRESSINGS) ×1
BAG URINE DRAINAGE (UROLOGICAL SUPPLIES) ×4 IMPLANT
BASKET STONE NITINOL 3FRX115MB (UROLOGICAL SUPPLIES) IMPLANT
BASKET ZERO TIP NITINOL 2.4FR (BASKET) IMPLANT
BENZOIN TINCTURE PRP APPL 2/3 (GAUZE/BANDAGES/DRESSINGS) ×4 IMPLANT
BLADE SURG 15 STRL LF DISP TIS (BLADE) ×1 IMPLANT
BLADE SURG 15 STRL SS (BLADE) ×3
BSKT STON RTRVL ZERO TP 2.4FR (BASKET)
CATCHER STONE W/TUBE ADAPTER (UROLOGICAL SUPPLIES) IMPLANT
CATH FOLEY 2W COUNCIL 20FR 5CC (CATHETERS) ×2 IMPLANT
CATH FOLEY 2WAY SLVR  5CC 16FR (CATHETERS)
CATH FOLEY 2WAY SLVR  5CC 18FR (CATHETERS)
CATH FOLEY 2WAY SLVR 5CC 16FR (CATHETERS) ×1 IMPLANT
CATH FOLEY 2WAY SLVR 5CC 18FR (CATHETERS) ×1 IMPLANT
CATH IMAGER II 65CM (CATHETERS) ×3 IMPLANT
CATH INTERMIT  6FR 70CM (CATHETERS) ×1 IMPLANT
CATH ROBINSON RED A/P 20FR (CATHETERS) IMPLANT
CATH URET DUAL LUMEN 6-10FR 50 (CATHETERS) IMPLANT
CATH X-FORCE N30 NEPHROSTOMY (TUBING) ×3 IMPLANT
COVER SURGICAL LIGHT HANDLE (MISCELLANEOUS) ×3 IMPLANT
DRAPE C-ARM 42X120 X-RAY (DRAPES) ×3 IMPLANT
DRAPE LINGEMAN PERC (DRAPES) ×3 IMPLANT
DRAPE SHEET LG 3/4 BI-LAMINATE (DRAPES) ×1 IMPLANT
DRAPE SURG IRRIG POUCH 19X23 (DRAPES) ×3 IMPLANT
DRSG PAD ABDOMINAL 8X10 ST (GAUZE/BANDAGES/DRESSINGS) ×6 IMPLANT
DRSG TEGADERM 8X12 (GAUZE/BANDAGES/DRESSINGS) ×4 IMPLANT
FIBER LASER FLEXIVA 1000 (UROLOGICAL SUPPLIES) IMPLANT
FIBER LASER FLEXIVA 365 (UROLOGICAL SUPPLIES) IMPLANT
FIBER LASER FLEXIVA 550 (UROLOGICAL SUPPLIES) IMPLANT
FIBER LASER TRAC TIP (UROLOGICAL SUPPLIES) IMPLANT
GAUZE SPONGE 4X4 12PLY STRL (GAUZE/BANDAGES/DRESSINGS) ×3 IMPLANT
GLOVE BIO SURGEON STRL SZ8 (GLOVE) ×5 IMPLANT
GLOVE BIOGEL PI IND STRL 8 (GLOVE) IMPLANT
GLOVE BIOGEL PI INDICATOR 8 (GLOVE) ×2
GOWN STRL REUS W/TWL LRG LVL3 (GOWN DISPOSABLE) ×4 IMPLANT
GOWN STRL REUS W/TWL XL LVL3 (GOWN DISPOSABLE) ×3 IMPLANT
GUIDEWIRE STR DUAL SENSOR (WIRE) ×4 IMPLANT
IV SET EXTENSION CATH 6 NF (IV SETS) ×1 IMPLANT
KIT BASIN OR (CUSTOM PROCEDURE TRAY) ×3 IMPLANT
MANIFOLD NEPTUNE II (INSTRUMENTS) ×3 IMPLANT
NDL TROCAR 18X15 ECHO (NEEDLE) IMPLANT
NDL TROCAR 18X20 (NEEDLE) IMPLANT
NEEDLE TROCAR 18X15 ECHO (NEEDLE) IMPLANT
NEEDLE TROCAR 18X20 (NEEDLE) IMPLANT
NS IRRIG 1000ML POUR BTL (IV SOLUTION) ×3 IMPLANT
PACK CYSTO (CUSTOM PROCEDURE TRAY) ×3 IMPLANT
PAD ABD 8X10 STRL (GAUZE/BANDAGES/DRESSINGS) ×2 IMPLANT
PROBE LITHOCLAST ULTRA 3.8X403 (UROLOGICAL SUPPLIES) IMPLANT
PROBE PNEUMATIC 1.0MMX570MM (UROLOGICAL SUPPLIES) IMPLANT
SET AMPLATZ RENAL DILATOR (MISCELLANEOUS) IMPLANT
SET IRRIG Y TYPE TUR BLADDER L (SET/KITS/TRAYS/PACK) ×1 IMPLANT
SHEATH PEELAWAY SET 9 (SHEATH) ×2 IMPLANT
SPONGE LAP 4X18 X RAY DECT (DISPOSABLE) ×3 IMPLANT
STENT CONTOUR 6FRX26X.038 (STENTS) IMPLANT
STENT CONTOUR 6FRX28X.038 (STENTS) ×2 IMPLANT
STONE CATCHER W/TUBE ADAPTER (UROLOGICAL SUPPLIES) IMPLANT
SUT SILK 2 0 30  PSL (SUTURE) ×2
SUT SILK 2 0 30 PSL (SUTURE) ×1 IMPLANT
SYR 10ML LL (SYRINGE) ×3 IMPLANT
SYR 20CC LL (SYRINGE) ×6 IMPLANT
SYR 50ML LL SCALE MARK (SYRINGE) ×3 IMPLANT
TOWEL OR 17X26 10 PK STRL BLUE (TOWEL DISPOSABLE) ×3 IMPLANT
TRAY FOLEY W/METER SILVER 16FR (SET/KITS/TRAYS/PACK) ×2 IMPLANT
TUBING CONNECTING 10 (TUBING) ×3 IMPLANT
TUBING CONNECTING 10' (TUBING) ×1
WATER STERILE IRR 1000ML POUR (IV SOLUTION) ×3 IMPLANT

## 2017-10-24 NOTE — H&P (View-Only) (Signed)
Urology Admission H&P  Chief Complaint: left renal calculus  History of Present Illness: [name] is a 72yo with a hx of struvite stones who developed a large left renal calculus. No flank pain. He is having gross hematuria. No fevers. IR placed neph tube today  Past Medical History:  Diagnosis Date  . A-fib (HCC)    hx.- sinus rhythm now  . Acid reflux   . AKI (acute kidney injury) (HCC)    mild  . Anxiety   . Arthritis   . Atrial dilatation, bilateral    Severly, enlargement  per ECHO 11/03/16  . BPH (benign prostatic hyperplasia)   . CAD (coronary artery disease)   . Depression   . Dislocation of internal right hip prosthesis, sequela   . Dysrhythmia    hx a fib  . Elevated brain natriuretic peptide (BNP) level   . Environmental allergies   . Fracture of wrist 10/23/13   LEFT  . High cholesterol   . History of Graves' disease   . History of kidney stones    surg. removal 02/2017  . History of syncope   . HLD (hyperlipidemia)   . Hypertension   . Hypothyroidism    thyroid removed  . Left knee injury    cap dislocated  . Low testosterone, possible hypogonadism 10/01/2012  . Lumbago   . Metabolic bone disease   . Multiple falls   . Osteoporosis    Severe  . Plaque psoriasis   . Sleep apnea    cpap machine-settings 17  . Thrombocytopenia ([place])   . Thyroid disease    HX GRAVES DISEASE  . Transfusion history    s/p 12'13 hip surgery  . Unsteady gait   . UTI (urinary tract infection)   . Vertebral compression fracture (HCC)    L1-wears brace   Past Surgical History:  Procedure Laterality Date  . APPENDECTOMY    . CARDIAC CATHETERIZATION  2013  . CATARACT EXTRACTION, BILATERAL    . COLONOSCOPY    . CYSTOSCOPY/URETEROSCOPY/HOLMIUM LASER/STENT PLACEMENT Right 03/11/2017   Procedure: CYSTOSCOPY/URETEROSCOPYSTENT PLACEMENT right ureter retrograde pylegram;  Surgeon: Cleon Gustin, MD;  Location: [facility];  Service: Urology;  Laterality: Right;  . HARDWARE  REMOVAL  10/05/2012   Procedure: HARDWARE REMOVAL;  Surgeon: [name], MD;  Location: [facility];  Service: Orthopedics;  Laterality: Right;  REMOVING  STRYKER  GAMMA NAIL  . HARDWARE REMOVAL Right 07/03/2013   Procedure: HARDWARE REMOVAL RIGHT TIBIA ;  Surgeon: Rozanna Box, MD;  Location: [place];  Service: Orthopedics;  Laterality: Right;  . HIP CLOSED REDUCTION Right 10/15/2013   Procedure: CLOSED MANIPULATION HIP;  Surgeon: [name], MD;  Location: [facility];  Service: Orthopedics;  Laterality: Right;  . hip revision     June 2017  . HOLMIUM LASER APPLICATION Right 0/05/6577   Procedure: HOLMIUM LASER APPLICATION;  Surgeon: Cleon Gustin, MD;  Location: [facility];  Service: Urology;  Laterality: Right;  . INCISION AND DRAINAGE HIP Right 11/16/2013   Procedure: IRRIGATION AND DEBRIDEMENT RIGHT HIP;  Surgeon: [name], MD;  Location: [facility];  Service: Orthopedics;  Laterality: Right;  . JOINT REPLACEMENT     hip-right x2  . KNEE SURGERY Bilateral 2012   Total knee replacements  . NEPHROLITHOTOMY Right 02/08/2017   Procedure: NEPHROLITHOTOMY PERCUTANEOUS WITH SURGEON ACCESS;  Surgeon: Cleon Gustin, MD;  Location: [facility];  Service: Urology;  Laterality: Right;  . ORIF TIBIA FRACTURE Right 02/06/2013  Procedure: OPEN REDUCTION INTERNAL FIXATION (ORIF) TIBIA FRACTURE WITH IM ROD FIBULA;  Surgeon: Rozanna Box, MD;  Location: [place];  Service: Orthopedics;  Laterality: Right;  . ORIF TIBIA FRACTURE Right 07/03/2013   Procedure: RIGHT TIBIA NON UNION REPAIR ;  Surgeon: Rozanna Box, MD;  Location: [place];  Service: Orthopedics;  Laterality: Right;  . ORIF WRIST FRACTURE  10/02/2012   Procedure: OPEN REDUCTION INTERNAL FIXATION (ORIF) WRIST FRACTURE;  Surgeon: Roseanne Kaufman, MD;  [facility];  Service: Orthopedics;  Laterality: Right;  WITH   ANTIBIOTIC  CEMENT  . ORIF WRIST FRACTURE Left 10/28/2013   Procedure: OPEN REDUCTION INTERNAL FIXATION (ORIF) WRIST FRACTURE with  allograft;  Surgeon: Roseanne Kaufman, MD;  [facility];  Service: Orthopedics;  Laterality: Left;  DVR Plate  . QUADRICEPS TENDON REPAIR Left 07/15/2017   Procedure: REPAIR QUADRICEP TENDON;  Surgeon: Frederik Pear, MD;  Location: [place];  Service: Orthopedics;  Laterality: Left;  . right femur surgery  05/2012  . THYROIDECTOMY  02/1986  . TOTAL HIP REVISION  10/05/2012   Procedure: TOTAL HIP REVISION;  Surgeon: [name], MD;  [facility];  Service: Orthopedics;  Laterality: Right;  RIGHT TOTAL HIP REVISION  . TOTAL HIP REVISION Right 09/17/2013   Procedure: REVISION RIGHT TOTAL HIP ARTHROPLASTY ;  Surgeon: [name], MD;  [facility];  Service: Orthopedics;  Laterality: Right;  . TOTAL HIP REVISION Right 10/26/2013   Procedure: REVISION RIGHT TOTAL HIP ARTHROPLASTY;  Surgeon: [name], MD;  [facility];  Service: Orthopedics;  Laterality: Right;  . TOTAL HIP REVISION  03/2016  . TOTAL KNEE REVISION Left 04/11/2017   Procedure: TOTAL KNEE REVISION PATELLA and TIBIA;  Surgeon: Frederik Pear, MD;  Location: [place];  Service: Orthopedics;  Laterality: Left;  . WRIST FRACTURE SURGERY  05/2012    Home Medications:  Current Facility-Administered Medications  Medication Dose Route Frequency Provider Last Rate Last Dose  . 0.9 %  sodium chloride infusion   Intravenous Continuous Saverio Danker, PA-C      . cefTRIAXone (ROCEPHIN) 2 g in dextrose 5 % 50 mL IVPB  2 g Intravenous 30 min Pre-Op Amerah Puleo, Candee Furbish, MD      . lactated ringers infusion   Intravenous Continuous Belinda Block, MD 50 mL/hr at 10/24/17 0809     Facility-Administered Medications Ordered in Other Encounters  Medication Dose Route Frequency Provider Last Rate Last Dose  . fentaNYL (SUBLIMAZE) 100 MCG/2ML injection           . lidocaine (XYLOCAINE) 1 % (with pres) injection           . lidocaine (XYLOCAINE) 1 % (with pres) injection           . midazolam (VERSED) 2 MG/2ML injection             Allergies:  Allergies  Allergen Reactions  . Short Ragweed Pollen Ext Cough    Family History  Problem Relation Age of Onset  . CAD Father 62  . Asthma Father   . Alcohol abuse Father   . Arthritis Mother   . Alcohol abuse Sister    Social History:  reports that he quit smoking about 5 years ago. he has never used smokeless tobacco. He reports that he drinks alcohol. He reports that he does not use drugs.  Review of Systems  All other systems reviewed and are negative.   Physical Exam:  Vital signs in last 24  hours: Temp:  [97.7 F (36.5 C)] 97.7 F (36.5 C) (01/14 0740) Pulse Rate:  [69-89] 81 (01/14 1125) Resp:  [14-25] 16 (01/14 1125) BP: (110-144)/(65-88) 144/81 (01/14 1125) SpO2:  [91 %-100 %] 95 % (01/14 1125) Weight:  [127 kg (280 lb)] 127 kg (280 lb) (01/14 0815) Physical Exam  Constitutional: He is oriented to person, place, and time. He appears well-developed and well-nourished.  HENT:  Head: Normocephalic and atraumatic.  Eyes: EOM are normal. Pupils are equal, round, and reactive to light.  Neck: Normal range of motion. No thyromegaly present.  Cardiovascular: Normal rate and regular rhythm.  Respiratory: Effort normal. No respiratory distress.  GI: Soft. He exhibits no distension.  Musculoskeletal: Normal range of motion. He exhibits no edema.  Neurological: He is alert and oriented to person, place, and time.  Skin: Skin is warm and dry.  Psychiatric: He has a normal mood and affect. His behavior is normal. Judgment and thought content normal.    Laboratory Data:  Results for orders placed or performed during the hospital encounter of 10/24/17 (from the past 24 hour(s))  Protime-INR     Status: None   Collection Time: 10/24/17  7:59 AM  Result Value Ref Range   Prothrombin Time 13.2 11.4 - 15.2 seconds   INR 1.01    No results found for this or any previous visit (from the past 240 hour(s)). Creatinine: No results for input(s): CREATININE in  the last 168 hours. Baseline Creatinine: unknwon  Impression/Assessment:  71yo with left staghorn calculus  Plan:  The risks/benefits/alternatives to L PCNL was explained to the patient and he understands and wishes to proceed with surgery  [name] 10/24/2017, 11:47 AM

## 2017-10-24 NOTE — H&P (Signed)
Urology Admission H&P  Chief Complaint: left renal calculus  History of Present Illness: [name] is a 72yo with a hx of struvite stones who developed a large left renal calculus. No flank pain. He is having gross hematuria. No fevers. IR placed neph tube today  Past Medical History:  Diagnosis Date  . A-fib (HCC)    hx.- sinus rhythm now  . Acid reflux   . AKI (acute kidney injury) (HCC)    mild  . Anxiety   . Arthritis   . Atrial dilatation, bilateral    Severly, enlargement  per ECHO 11/03/16  . BPH (benign prostatic hyperplasia)   . CAD (coronary artery disease)   . Depression   . Dislocation of internal right hip prosthesis, sequela   . Dysrhythmia    hx a fib  . Elevated brain natriuretic peptide (BNP) level   . Environmental allergies   . Fracture of wrist 10/23/13   LEFT  . High cholesterol   . History of Graves' disease   . History of kidney stones    surg. removal 02/2017  . History of syncope   . HLD (hyperlipidemia)   . Hypertension   . Hypothyroidism    thyroid removed  . Left knee injury    cap dislocated  . Low testosterone, possible hypogonadism 10/01/2012  . Lumbago   . Metabolic bone disease   . Multiple falls   . Osteoporosis    Severe  . Plaque psoriasis   . Sleep apnea    cpap machine-settings 17  . Thrombocytopenia ([place])   . Thyroid disease    HX GRAVES DISEASE  . Transfusion history    s/p 12'13 hip surgery  . Unsteady gait   . UTI (urinary tract infection)   . Vertebral compression fracture (HCC)    L1-wears brace   Past Surgical History:  Procedure Laterality Date  . APPENDECTOMY    . CARDIAC CATHETERIZATION  2013  . CATARACT EXTRACTION, BILATERAL    . COLONOSCOPY    . CYSTOSCOPY/URETEROSCOPY/HOLMIUM LASER/STENT PLACEMENT Right 03/11/2017   Procedure: CYSTOSCOPY/URETEROSCOPYSTENT PLACEMENT right ureter retrograde pylegram;  Surgeon: Cleon Gustin, MD;  [facility];  Service: Urology;  Laterality: Right;  . HARDWARE  REMOVAL  10/05/2012   Procedure: HARDWARE REMOVAL;  Surgeon: [name], MD;  [facility];  Service: Orthopedics;  Laterality: Right;  REMOVING  STRYKER  GAMMA NAIL  . HARDWARE REMOVAL Right 07/03/2013   Procedure: HARDWARE REMOVAL RIGHT TIBIA ;  Surgeon: Rozanna Box, MD;  Location: [place];  Service: Orthopedics;  Laterality: Right;  . HIP CLOSED REDUCTION Right 10/15/2013   Procedure: CLOSED MANIPULATION HIP;  Surgeon: [name], MD;  [facility];  Service: Orthopedics;  Laterality: Right;  . hip revision     June 2017  . HOLMIUM LASER APPLICATION Right 10/12/9415   Procedure: HOLMIUM LASER APPLICATION;  Surgeon: Cleon Gustin, MD;  [facility];  Service: Urology;  Laterality: Right;  . INCISION AND DRAINAGE HIP Right 11/16/2013   Procedure: IRRIGATION AND DEBRIDEMENT RIGHT HIP;  Surgeon: [name], MD;  [facility];  Service: Orthopedics;  Laterality: Right;  . JOINT REPLACEMENT     hip-right x2  . KNEE SURGERY Bilateral 2012   Total knee replacements  . NEPHROLITHOTOMY Right 02/08/2017   Procedure: NEPHROLITHOTOMY PERCUTANEOUS WITH SURGEON ACCESS;  Surgeon: Cleon Gustin, MD;  [facility];  Service: Urology;  Laterality: Right;  . ORIF TIBIA FRACTURE Right 02/06/2013  Procedure: OPEN REDUCTION INTERNAL FIXATION (ORIF) TIBIA FRACTURE WITH IM ROD FIBULA;  Surgeon: Rozanna Box, MD;  Location: [place];  Service: Orthopedics;  Laterality: Right;  . ORIF TIBIA FRACTURE Right 07/03/2013   Procedure: RIGHT TIBIA NON UNION REPAIR ;  Surgeon: Rozanna Box, MD;  Location: [place];  Service: Orthopedics;  Laterality: Right;  . ORIF WRIST FRACTURE  10/02/2012   Procedure: OPEN REDUCTION INTERNAL FIXATION (ORIF) WRIST FRACTURE;  Surgeon: Roseanne Kaufman, MD;  [facility];  Service: Orthopedics;  Laterality: Right;  WITH   ANTIBIOTIC  CEMENT  . ORIF WRIST FRACTURE Left 10/28/2013   Procedure: OPEN REDUCTION INTERNAL FIXATION (ORIF) WRIST FRACTURE with  allograft;  Surgeon: Roseanne Kaufman, MD;  [facility];  Service: Orthopedics;  Laterality: Left;  DVR Plate  . QUADRICEPS TENDON REPAIR Left 07/15/2017   Procedure: REPAIR QUADRICEP TENDON;  Surgeon: Frederik Pear, MD;  Location: [place];  Service: Orthopedics;  Laterality: Left;  . right femur surgery  05/2012  . THYROIDECTOMY  02/1986  . TOTAL HIP REVISION  10/05/2012   Procedure: TOTAL HIP REVISION;  Surgeon: [name], MD;  [facility];  Service: Orthopedics;  Laterality: Right;  RIGHT TOTAL HIP REVISION  . TOTAL HIP REVISION Right 09/17/2013   Procedure: REVISION RIGHT TOTAL HIP ARTHROPLASTY ;  Surgeon: [name], MD;  [facility];  Service: Orthopedics;  Laterality: Right;  . TOTAL HIP REVISION Right 10/26/2013   Procedure: REVISION RIGHT TOTAL HIP ARTHROPLASTY;  Surgeon: [name], MD;  [facility];  Service: Orthopedics;  Laterality: Right;  . TOTAL HIP REVISION  03/2016  . TOTAL KNEE REVISION Left 04/11/2017   Procedure: TOTAL KNEE REVISION PATELLA and TIBIA;  Surgeon: Frederik Pear, MD;  Location: [place];  Service: Orthopedics;  Laterality: Left;  . WRIST FRACTURE SURGERY  05/2012    Home Medications:  Current Facility-Administered Medications  Medication Dose Route Frequency Provider Last Rate Last Dose  . 0.9 %  sodium chloride infusion   Intravenous Continuous Saverio Danker, PA-C      . cefTRIAXone (ROCEPHIN) 2 g in dextrose 5 % 50 mL IVPB  2 g Intravenous 30 min Pre-Op McKenzie, Candee Furbish, MD      . lactated ringers infusion   Intravenous Continuous Belinda Block, MD 50 mL/hr at 10/24/17 0809     Facility-Administered Medications Ordered in Other Encounters  Medication Dose Route Frequency Provider Last Rate Last Dose  . fentaNYL (SUBLIMAZE) 100 MCG/2ML injection           . lidocaine (XYLOCAINE) 1 % (with pres) injection           . lidocaine (XYLOCAINE) 1 % (with pres) injection           . midazolam (VERSED) 2 MG/2ML injection             Allergies:  Allergies  Allergen Reactions  . Short Ragweed Pollen Ext Cough    Family History  Problem Relation Age of Onset  . CAD Father 23  . Asthma Father   . Alcohol abuse Father   . Arthritis Mother   . Alcohol abuse Sister    Social History:  reports that he quit smoking about 5 years ago. he has never used smokeless tobacco. He reports that he drinks alcohol. He reports that he does not use drugs.  Review of Systems  All other systems reviewed and are negative.   Physical Exam:  Vital signs in last 24  hours: Temp:  [97.7 F (36.5 C)] 97.7 F (36.5 C) (01/14 0740) Pulse Rate:  [69-89] 81 (01/14 1125) Resp:  [14-25] 16 (01/14 1125) BP: (110-144)/(65-88) 144/81 (01/14 1125) SpO2:  [91 %-100 %] 95 % (01/14 1125) Weight:  [127 kg (280 lb)] 127 kg (280 lb) (01/14 0815) Physical Exam  Constitutional: He is oriented to person, place, and time. He appears well-developed and well-nourished.  HENT:  Head: Normocephalic and atraumatic.  Eyes: EOM are normal. Pupils are equal, round, and reactive to light.  Neck: Normal range of motion. No thyromegaly present.  Cardiovascular: Normal rate and regular rhythm.  Respiratory: Effort normal. No respiratory distress.  GI: Soft. He exhibits no distension.  Musculoskeletal: Normal range of motion. He exhibits no edema.  Neurological: He is alert and oriented to person, place, and time.  Skin: Skin is warm and dry.  Psychiatric: He has a normal mood and affect. His behavior is normal. Judgment and thought content normal.    Laboratory Data:  Results for orders placed or performed during the hospital encounter of 10/24/17 (from the past 24 hour(s))  Protime-INR     Status: None   Collection Time: 10/24/17  7:59 AM  Result Value Ref Range   Prothrombin Time 13.2 11.4 - 15.2 seconds   INR 1.01    No results found for this or any previous visit (from the past 240 hour(s)). Creatinine: No results for input(s): CREATININE in  the last 168 hours. Baseline Creatinine: unknwon  Impression/Assessment:  71yo with left staghorn calculus  Plan:  The risks/benefits/alternatives to L PCNL was explained to the patient and he understands and wishes to proceed with surgery  [name] 10/24/2017, 11:47 AM

## 2017-10-24 NOTE — H&P (Signed)
Chief Complaint: Left renal calculus  Referring Physician(s): McKenzie,Patrick L  Supervising Physician: Markus Daft  Patient Status: [facility] - Out-pt  History of Present Illness: [name] is a 72 y.o. male with left renal calculus and scheduled for nephrolithotomy procedure today.    He is here today for placement of a percutaneous nephroureteral catheter to allow access for Urology to remove the stone.  He does reports some hematuria.  He takes Xarelto for Afib and he has held this.  He is NPO.  Past Medical History:  Diagnosis Date  . A-fib (HCC)    hx.- sinus rhythm now  . Acid reflux   . AKI (acute kidney injury) (HCC)    mild  . Anxiety   . Arthritis   . Atrial dilatation, bilateral    Severly, enlargement  per ECHO 11/03/16  . BPH (benign prostatic hyperplasia)   . CAD (coronary artery disease)   . Depression   . Dislocation of internal right hip prosthesis, sequela   . Dysrhythmia    hx a fib  . Elevated brain natriuretic peptide (BNP) level   . Environmental allergies   . Fracture of wrist 10/23/13   LEFT  . High cholesterol   . History of Graves' disease   . History of kidney stones    surg. removal 02/2017  . History of syncope   . HLD (hyperlipidemia)   . Hypertension   . Hypothyroidism    thyroid removed  . Left knee injury    cap dislocated  . Low testosterone, possible hypogonadism 10/01/2012  . Lumbago   . Metabolic bone disease   . Multiple falls   . Osteoporosis    Severe  . Plaque psoriasis   . Sleep apnea    cpap machine-settings 17  . Thrombocytopenia ([place])   . Thyroid disease    HX GRAVES DISEASE  . Transfusion history    s/p 12'13 hip surgery  . Unsteady gait   . UTI (urinary tract infection)   . Vertebral compression fracture (HCC)    L1-wears brace    Past Surgical History:  Procedure Laterality Date  . APPENDECTOMY    . CARDIAC CATHETERIZATION  2013  . CATARACT EXTRACTION, BILATERAL    . COLONOSCOPY    .  CYSTOSCOPY/URETEROSCOPY/HOLMIUM LASER/STENT PLACEMENT Right 03/11/2017   Procedure: CYSTOSCOPY/URETEROSCOPYSTENT PLACEMENT right ureter retrograde pylegram;  Surgeon: Cleon Gustin, MD;  [facility];  Service: Urology;  Laterality: Right;  . HARDWARE REMOVAL  10/05/2012   Procedure: HARDWARE REMOVAL;  Surgeon: [name], MD;  [facility];  Service: Orthopedics;  Laterality: Right;  REMOVING  STRYKER  GAMMA NAIL  . HARDWARE REMOVAL Right 07/03/2013   Procedure: HARDWARE REMOVAL RIGHT TIBIA ;  Surgeon: Rozanna Box, MD;  Location: [place];  Service: Orthopedics;  Laterality: Right;  . HIP CLOSED REDUCTION Right 10/15/2013   Procedure: CLOSED MANIPULATION HIP;  Surgeon: [name], MD;  [facility];  Service: Orthopedics;  Laterality: Right;  . hip revision     June 2017  . HOLMIUM LASER APPLICATION Right 05/15/8849   Procedure: HOLMIUM LASER APPLICATION;  Surgeon: Cleon Gustin, MD;  [facility];  Service: Urology;  Laterality: Right;  . INCISION AND DRAINAGE HIP Right 11/16/2013   Procedure: IRRIGATION AND DEBRIDEMENT RIGHT HIP;  Surgeon: [name], MD;  [facility];  Service: Orthopedics;  Laterality: Right;  . JOINT REPLACEMENT     hip-right x2  . KNEE SURGERY Bilateral  2012   Total knee replacements  . NEPHROLITHOTOMY Right 02/08/2017   Procedure: NEPHROLITHOTOMY PERCUTANEOUS WITH SURGEON ACCESS;  Surgeon: Cleon Gustin, MD;  [facility];  Service: Urology;  Laterality: Right;  . ORIF TIBIA FRACTURE Right 02/06/2013   Procedure: OPEN REDUCTION INTERNAL FIXATION (ORIF) TIBIA FRACTURE WITH IM ROD FIBULA;  Surgeon: Rozanna Box, MD;  Location: [place];  Service: Orthopedics;  Laterality: Right;  . ORIF TIBIA FRACTURE Right 07/03/2013   Procedure: RIGHT TIBIA NON UNION REPAIR ;  Surgeon: Rozanna Box, MD;  Location: [place];  Service: Orthopedics;  Laterality: Right;  . ORIF WRIST FRACTURE  10/02/2012   Procedure: OPEN REDUCTION INTERNAL  FIXATION (ORIF) WRIST FRACTURE;  Surgeon: Roseanne Kaufman, MD;  [facility];  Service: Orthopedics;  Laterality: Right;  WITH   ANTIBIOTIC  CEMENT  . ORIF WRIST FRACTURE Left 10/28/2013   Procedure: OPEN REDUCTION INTERNAL FIXATION (ORIF) WRIST FRACTURE with allograft;  Surgeon: Roseanne Kaufman, MD;  [facility];  Service: Orthopedics;  Laterality: Left;  DVR Plate  . QUADRICEPS TENDON REPAIR Left 07/15/2017   Procedure: REPAIR QUADRICEP TENDON;  Surgeon: Frederik Pear, MD;  Location: [place];  Service: Orthopedics;  Laterality: Left;  . right femur surgery  05/2012  . THYROIDECTOMY  02/1986  . TOTAL HIP REVISION  10/05/2012   Procedure: TOTAL HIP REVISION;  Surgeon: [name], MD;  [facility];  Service: Orthopedics;  Laterality: Right;  RIGHT TOTAL HIP REVISION  . TOTAL HIP REVISION Right 09/17/2013   Procedure: REVISION RIGHT TOTAL HIP ARTHROPLASTY ;  Surgeon: [name], MD;  [facility];  Service: Orthopedics;  Laterality: Right;  . TOTAL HIP REVISION Right 10/26/2013   Procedure: REVISION RIGHT TOTAL HIP ARTHROPLASTY;  Surgeon: [name], MD;  [facility];  Service: Orthopedics;  Laterality: Right;  . TOTAL HIP REVISION  03/2016  . TOTAL KNEE REVISION Left 04/11/2017   Procedure: TOTAL KNEE REVISION PATELLA and TIBIA;  Surgeon: Frederik Pear, MD;  Location: [place];  Service: Orthopedics;  Laterality: Left;  . WRIST FRACTURE SURGERY  05/2012    Allergies: Short ragweed pollen ext  Medications: Prior to Admission medications   Medication Sig Start Date End Date Taking? Authorizing Provider  alfuzosin (UROXATRAL) 10 MG 24 hr tablet Take 1 tablet (10 mg total) at bedtime by mouth. 08/16/17   Plotnikov, Evie Lacks, MD  amoxicillin (AMOXIL) 500 MG capsule Take 2,000 mg by mouth See admin instructions. Takes 4 capsules 1 hour prior to dental procedures    [provider]  amoxicillin-clavulanate (AUGMENTIN) 875-125 MG tablet Take 1 tablet by mouth daily at 12  noon.    [provider]  atorvastatin (LIPITOR) 80 MG tablet Take 1 tablet (80 mg total) 2 (two) times a week by mouth. Monday & Friday evenings ONLY 08/18/17   Plotnikov, Evie Lacks, MD  buPROPion (WELLBUTRIN XL) 300 MG 24 hr tablet Take 1 tablet (300 mg total) daily with breakfast by mouth. Patient taking differently: Take 300 mg by mouth daily at 6 (six) AM.  08/16/17   Plotnikov, Evie Lacks, MD  Calcium Citrate-Vitamin D (CALCIUM CITRATE + D PO) Take 1 tablet by mouth daily.     [provider]  Cholecalciferol (VITAMIN D) 2000 units CAPS Take 2,000 Units by mouth 2 (two) times daily.    [provider]  clonazePAM (KLONOPIN) 1 MG tablet Take one tablet by mouth every 6 hours Patient taking differently: Take 1 mg  by mouth every 6 (six) hours as needed for anxiety. Take one tablet by mouth every 6 hours 08/16/17   Plotnikov, Evie Lacks, MD  denosumab (PROLIA) 60 MG/ML SOLN injection Inject 60 mg into the skin every 6 (six) months. Administer in upper arm, thigh, or abdomen    [provider]  diltiazem (CARDIZEM CD) 240 MG 24 hr capsule Take 1 capsule (240 mg total) daily by mouth. Patient taking differently: Take 240 mg by mouth daily with breakfast. ([id number]) 08/16/17   Plotnikov, Evie Lacks, MD  doxycycline (VIBRA-TABS) 100 MG tablet Take 1 tablet (100 mg total) daily by mouth. 08/16/17   Plotnikov, Evie Lacks, MD  DULoxetine (CYMBALTA) 60 MG capsule Take 1 capsule (60 mg total) 2 (two) times daily by mouth. 08/16/17   Plotnikov, Evie Lacks, MD  EPINEPHrine (EPIPEN 2-PAK) 0.3 mg/0.3 mL IJ SOAJ injection Inject 0.3 mg into the muscle daily as needed (for anaphylactic reaction).    [provider]  etanercept (ENBREL) 50 MG/ML injection Inject 50 mg into the skin 2 (two) times a week. Tuesday & Friday    [provider]  Ferrous Gluconate (IRON 27 PO) Take 1 tablet by mouth 2 (two) times daily.     [provider]  ferrous sulfate 325 (65 FE)  MG tablet Take 325 mg by mouth 2 (two) times daily.    [provider]  HYDROcodone-acetaminophen (NORCO) 7.5-325 MG tablet Take 1 tablet every 6 (six) hours as needed by mouth for severe pain. It is a 3 months supply 08/16/17   Plotnikov, Evie Lacks, MD  levothyroxine (SYNTHROID, LEVOTHROID) 200 MCG tablet Take 1 tablet (200 mcg total) daily at 6 (six) AM by mouth. (0500) Patient taking differently: Take 200 mcg by mouth daily at 6 (six) AM. (0500) on empty stomach 08/16/17   Plotnikov, Evie Lacks, MD  levothyroxine (SYNTHROID, LEVOTHROID) 25 MCG tablet Take 1 tablet (25 mcg total) daily before breakfast by mouth. Patient taking differently: Take 25 mcg by mouth daily at 6 (six) AM. (0500) on empty stomach 08/16/17   Plotnikov, Evie Lacks, MD  loratadine (CLARITIN) 10 MG tablet Take 10 mg by mouth daily.     [provider]  Multiple Vitamin (MULTIVITAMIN WITH MINERALS) TABS tablet Take 1 tablet by mouth daily. Men's One-A-Day 50+    [provider]  nitroGLYCERIN (NITROSTAT) 0.4 MG SL tablet Place 0.4 mg under the tongue every 5 (five) minutes as needed for chest pain. x3 doses as needed for chest pain    [provider]  omeprazole (PRILOSEC) 40 MG capsule Take 1 capsule (40 mg total) daily before breakfast by mouth. Patient taking differently: Take 40 mg by mouth daily with breakfast.  08/16/17   Plotnikov, Evie Lacks, MD  polyethylene glycol (MIRALAX / GLYCOLAX) packet Take 17 g by mouth daily as needed (for constipation.).     [provider]  Polyvinyl Alcohol-Povidone PF (REFRESH) 1.4-0.6 % SOLN Place 1-2 drops into both eyes 3 (three) times daily as needed (for dry eyes.).    [provider]  Probiotic Product (PROBIOTIC PO) Take 1 capsule by mouth daily with breakfast.    [provider]  rivaroxaban (XARELTO) 10 MG TABS tablet Take 1 tablet (10 mg total) daily by mouth. Patient not taking: Reported on 10/07/2017 08/16/17   Plotnikov,  Evie Lacks, MD  rivaroxaban (XARELTO) 20 MG TABS tablet Take 20 mg by mouth daily with breakfast.    [provider]  Suvorexant (Mar-Mac) 15 MG  TABS Take 15 mg by mouth at bedtime as needed. Patient taking differently: Take 15 mg by mouth at bedtime as needed (for sleep.).  09/06/17   Plotnikov, Evie Lacks, MD  testosterone cypionate (DEPOTESTOSTERONE CYPIONATE) 200 MG/ML injection Inject 200mg  (10ml) intramuscularly every 14 days Patient taking differently: Inject 200 mg into the muscle every 14 (fourteen) days. Inject 200mg  (40ml) intramuscularly every 14 days--every other Friday 09/12/17   Plotnikov, Evie Lacks, MD  tiZANidine (ZANAFLEX) 4 MG tablet Take 1 tablet (4 mg total) every 6 (six) hours as needed by mouth for muscle spasms. 08/16/17   Plotnikov, Evie Lacks, MD  vitamin C (ASCORBIC ACID) 500 MG tablet Take 500 mg by mouth daily.    [provider]     Family History  Problem Relation Age of Onset  . CAD Father 20  . Asthma Father   . Alcohol abuse Father   . Arthritis Mother   . Alcohol abuse Sister     Social History   Socioeconomic History  . Marital status: Married    Spouse name: Not on file  . Number of children: Not on file  . Years of education: Not on file  . Highest education level: Not on file  Social Needs  . Financial resource strain: Not on file  . Food insecurity - worry: Not on file  . Food insecurity - inability: Not on file  . Transportation needs - medical: Not on file  . Transportation needs - non-medical: Not on file  Occupational History  . Not on file  Tobacco Use  . Smoking status: Former Smoker    Last attempt to quit: 06/05/2012    Years since quitting: 5.3  . Smokeless tobacco: Never Used  Substance and Sexual Activity  . Alcohol use: Yes    Comment: occasional-social  . Drug use: No  . Sexual activity: Yes  Other Topics Concern  . Not on file  Social History Narrative   [place] 2.5 months, went home Feb 21st slipped and  fell on back and developed.  Home PT/OT.  Just started outpatient physical therapy.  Friday night, misstepped.      Review of Systems: A 12 point ROS discussed  Review of Systems  Constitutional: Negative.   HENT: Negative.   Respiratory: Negative.   Gastrointestinal: Negative.   Genitourinary: Positive for hematuria.  Musculoskeletal: Negative.   Neurological: Negative.   Hematological: Negative.   Psychiatric/Behavioral: Negative.     Vital Signs: There were no vitals taken for this visit.  Physical Exam  Constitutional: He is oriented to person, place, and time. He appears well-developed.  HENT:  Head: Normocephalic and atraumatic.  Eyes: EOM are normal.  Neck: Normal range of motion.  Cardiovascular: Normal rate, regular rhythm and normal heart sounds.  Pulmonary/Chest: Effort normal and breath sounds normal.  Abdominal: Soft. He exhibits no distension.  Musculoskeletal: Normal range of motion.  Neurological: He is alert and oriented to person, place, and time.  Skin: Skin is warm and dry.  Psychiatric: He has a normal mood and affect. His behavior is normal. Judgment and thought content normal.    Imaging: No results found.  Labs:  CBC: Recent Labs    04/13/17 0434 07/12/17 1045 08/16/17 1507 10/17/17 1003  WBC 6.6 6.1 5.8 5.0  HGB 9.5* 14.8 14.8 14.5  HCT 31.3* 45.4 45.1 44.1  PLT 188 157 183.0 143*    COAGS: Recent Labs    04/08/17 1335 04/11/17 0836 07/12/17 1045 07/15/17 0321 10/24/17 2248  INR 1.45 1.18 2.09 1.10 1.01  APTT 47* 37*  --   --   --     BMP: Recent Labs    04/08/17 1335 04/12/17 0557 07/12/17 1045 08/16/17 1507 10/17/17 1003  NA 131* 134* 135 135 135  K 4.4 4.7 4.3 4.5 4.3  CL 100* 99* 101 100 102  CO2 24 28 28 28 28   GLUCOSE 97 121* 136* 88 112*  BUN 13 23* 18 25* 23*  CALCIUM 9.0 8.5* 8.7* 9.6 8.8*  CREATININE 1.04 1.42* 1.16 1.26 1.18  GFRNONAA >60 48* >60  --  60*  GFRAA >60 56* >60  --  >60    LIVER  FUNCTION TESTS: Recent Labs    11/02/16 2244 08/16/17 1507  BILITOT 1.5* 0.6  AST 45* 20  ALT 40 23  ALKPHOS 86 69  PROT 7.2 7.6  ALBUMIN 2.8* 3.9    TUMOR MARKERS: No results for input(s): AFPTM, CEA, CA199, CHROMGRNA in the last 8760 hours.  Assessment and Plan:  Left calculus.  Will proceed with image guided placement of left nephroureteral tube today by Dr. Anselm Pancoast.  Risks and benefits of nephrostomy tube were discussed with the patient including, but not limited to, infection, bleeding, significant bleeding causing loss or decrease in renal function or damage to adjacent structures.   All of the patient's questions were answered, patient is agreeable to proceed.  Consent signed and in chart.  Thank you for this interesting consult.  I greatly enjoyed meeting [name] and look forward to participating in their care.  A copy of this report was sent to the requesting provider on this date.  Electronically Signed: Murrell Redden, PA-C 10/24/2017, 9:07 AM   I spent a total of  30 Minutes in face to face in clinical consultation, greater than 50% of which was counseling/coordinating care for nephrostomy tube.

## 2017-10-24 NOTE — Anesthesia Preprocedure Evaluation (Addendum)
Anesthesia Evaluation  Patient identified by MRN, date of birth, ID band Patient awake    Reviewed: Allergy & Precautions, NPO status , Patient's Chart, lab work & pertinent test results  Airway Mallampati: II       Dental no notable dental hx. (+) Poor Dentition   Pulmonary former smoker,    Pulmonary exam normal breath sounds clear to auscultation       Cardiovascular hypertension,  Rhythm:Irregular Rate:Tachycardia     Neuro/Psych    GI/Hepatic   Endo/Other    Renal/GU      Musculoskeletal   Abdominal (+) + obese,   Peds  Hematology   Anesthesia Other Findings HODGE STACHNIK  ECHO COMPLETE WO IMAGE ENHANCING AGENT  Order# [id number]  Reading physician: Pixie Casino, MD Ordering physician: Ivor Costa, MD Study date: 11/03/16 Study Result   Result status: Final result                             *[facility] [employer].                        [place], [place] [address]                            [phone]  ------------------------------------------------------------------- Transthoracic Echocardiography  Patient:    [name], [name] MR #:       [id number] Study Date: 11/03/2016 Gender:     M Age:        72 Height:     190.5 cm Weight:     136.1 kg BSA:        2.73 m^2 Pt. Status: Room:       [place], [place] [id number]  [id number]     [place], WVPXT [id number]  Alvie Heidelberg [id number]  SONOGRAPHER  Donata Clay  PERFORMING   [facility], Inpatient  ATTENDING    Duffy Bruce  cc:  ------------------------------------------------------------------- LV EF: 60% -   65%  ------------------------------------------------------------------- Indications:      Edema 782.3.  ------------------------------------------------------------------- History:   PMH:  Vascular congestion. Elevated BNP. UTI.  Fall. Hypothyroidism. Low testosterone. Atrial fibrillation. Hypertension. Psoriasis. GERD. Depression and anxiety. CAD. BPH. Hyperlipidemia. Mild AKI.  ------------------------------------------------------------------- Study Conclusions  - Left ventricle: Systolic function was normal. The estimated   ejection fraction was in the range of 60% to 65%. The study is   not technically sufficient to allow evaluation of LV diastolic   function. - Left atrium: Severely dilated. - Right atrium: Severely dilated.  Impressions:  - Technically difficult study with poor echo windows. The LVEF is   grossly normal at 60-65%, there is severe biatrial enlargment,   the IVC is dilated.  ------------------------------------------------------------------- Study data:  Comparison was made to the study of 02/23/2016.  Study status:  Routine.  Procedure:  The patient reported no pain pre or post test.  Study completion:  There were no complications.  Transthoracic echocardiography.  M-mode, complete 2D, spectral Doppler, and color Doppler.  Birthdate:  Patient birthdate: [birth year].    Reproductive/Obstetrics  Anesthesia Physical  Anesthesia Plan  ASA: III  Anesthesia Plan: General   Post-op Pain Management:  Regional for Post-op pain   Induction: Intravenous  PONV Risk Score and Plan: 1 and 4 or greater and Ondansetron, Dexamethasone and Treatment may vary due to age or medical condition  Airway Management Planned: Oral ETT  Additional Equipment:   Intra-op Plan:   Post-operative Plan: Extubation in OR  Informed Consent: I have reviewed the patients History and Physical, chart, labs and discussed the procedure including the risks, benefits and alternatives for the proposed anesthesia with the patient or authorized representative who has indicated his/her understanding and acceptance.   Dental advisory given  Plan Discussed  with: Surgeon and CRNA  Anesthesia Plan Comments:        Anesthesia Quick Evaluation

## 2017-10-24 NOTE — Procedures (Signed)
  Pre-operative Diagnosis:  Left renal calculus       Post-operative Diagnosis: Left renal pelvic calculus   Indications: Needs access for nephrolithotomy procedure  Procedure: Placement of left nephroureteral catheter via lower pole access  Findings: Stone poorly visualized with fluoro.  Tried to puncture stone with US guidance but unsuccessful.  Patient given 75 ml of intravenous contrast and lower calyx was successfully accessed when contrast was in the collecting system.  Attempted to gain a second access from mid pole but this was unsuccessful and difficult due to decompressed collecting system.  Lower pole access was advanced into urinary bladder.  Complications: none     EBL: Minimal  Plan: To OR for stone removal procedure.

## 2017-10-24 NOTE — Progress Notes (Signed)
Pt refused CPAP qhs.  Pt states that he doesn't wear it all the time at home and doesn't want to wear it here.  Pt instructed to contact RT if he changed his mind.

## 2017-10-24 NOTE — Op Note (Signed)
Preoperative diagnosis: Left renal stone  Postoperative diagnosis: Same  Procedure 1.  Left percutaneous nephrostolithotomy for stone greater than 2 cm 2.  Left nephrostogram 3.  Intraoperative fluoroscopy, under 1 hour, with interpretation 4.  Placement of a 6 x 28 double-J ureteral stent. 5.  Left ureteroscopy with lithotripsy and stone extraction 6.  Placement of a 20 French nephrostomy tube 7.  Dilation of percutaneous tract  Attending: Dr. Alyson Ingles  Anesthesia: General  Estimated blood loss: 100cc  Antibiotics: rocephin  Drains: 1.  45 French Foley catheter 2.  6 x 28 left double-J ureteral stent 3.  20 French nephrostomy tube  Specimens: Stone for analysis  Findings: upper and renal pelvis staghorn calculus  Indications: Patient is a 72 year old male with a history of large left renal stone and history of recurrent struvite stones.  After discussing treatment options and decided he was left percutaneous nephrostolithotomy.  Patient already has a nephrostomy tube.  Procedure in detail: Prior to procedure consent was obtained.  Patient was brought to the operating room debridement was done to ensure correct patient, correct procedure, and correct site.  General anesthesia was administered.  A 16 French Foley catheter was in place.  The patient was then placed in the prone position.  His nephrostomy tube and left flank was then prepped and draped in usual sterile fashion.  A nephrostogram was obtained and findings noted above.  Through the nephrostomy tube we then placed a sensor wire.  Sensor wire was coiled in the renal pelvis we then removed the nephrostomy tube.  We then made an incision at the level of the skin and over the wire we then placed a NephroMax dilator.  We dilated the nephrostomy tract to 30 [place] and held this 18 cm of water for 1 minute.  We then  placed the access sheath over the balloon.  The balloon was then deflated.  We then used a rigid nephroscope to  perform nephroscopy.  We encountered a large renal pelvis stone and upper pole calculus. Using the lithoclast we fragmented the stones and removed the calculi with suction. When no residual stone was seen we then removed the rigid nephroscope and used the flexible nephroscope to perform nephroscopy.  No residual stone was visualized.  We then removed the nephroscope and over the wire placed a 6 x 28 double-J ureteral stent.  The wire was then removed and good coil was noted in the renal pelvis under direct vision in the bladder under fluoroscopy.  We then placed a 20 French nephrostomy tube through the sheath into the renal pelvis.  The balloon was inflated with 3 ml of contrast.  We then removed the access sheath in and obtain another nephrostogram.  We noted minimal extravasation of contrast.  We then secured the nephrostomy tubes with 0 silks in interrupted fashion.  Dressing was placed over the nephrostomy tube site and this then concluded the procedure was well-tolerated by the patient.  Complications: None  Condition: Stable, extubated, transferred to PACU  Plan: Patient is to be admitted overnight for observation.  His Foley catheter through the morning.  He is then to be discharged home and followup in 3 days with a CT scan to assess for residual calculi

## 2017-10-24 NOTE — Transfer of Care (Signed)
Immediate Anesthesia Transfer of Care Note  Patient: [name]  Procedure(s) Performed: NEPHROLITHOTOMY PERCUTANEOUS (Left )  Patient Location: PACU  Anesthesia Type:General  Level of Consciousness: awake, alert  and oriented  Airway & Oxygen Therapy: Patient Spontanous Breathing and Patient connected to face mask oxygen  Post-op Assessment: Report given to RN and Post -op Vital signs reviewed and stable  Post vital signs: Reviewed and stable  Last Vitals:  Vitals:   10/24/17 0740  BP: 118/88  Pulse: 80  Resp: 18  Temp: 36.5 C  SpO2: 96%    Last Pain:  Vitals:   10/24/17 0740  TempSrc: Oral      Patients Stated Pain Goal: 6 (83/41/96 2229)  Complications: No apparent anesthesia complications

## 2017-10-24 NOTE — Anesthesia Procedure Notes (Signed)
Procedure Name: Intubation Date/Time: 10/24/2017 12:06 PM Performed by: [place] Montrelle Eddings C, CRNA Pre-anesthesia Checklist: Patient identified, Emergency Drugs available, Suction available and Patient being monitored Patient Re-evaluated:Patient Re-evaluated prior to induction Oxygen Delivery Method: Circle system utilized Preoxygenation: Pre-oxygenation with 100% oxygen Induction Type: IV induction Ventilation: Mask ventilation without difficulty Laryngoscope Size: Mac and 4 Grade View: Grade I Tube type: Oral Tube size: 7.5 mm Number of attempts: 1 Airway Equipment and Method: Stylet and Oral airway Placement Confirmation: ETT inserted through vocal cords under direct vision,  positive ETCO2 and breath sounds checked- equal and bilateral Tube secured with: Tape Dental Injury: Teeth and Oropharynx as per pre-operative assessment

## 2017-10-25 ENCOUNTER — Encounter (HOSPITAL_COMMUNITY): Payer: Self-pay | Admitting: Urology

## 2017-10-25 DIAGNOSIS — N200 Calculus of kidney: Secondary | ICD-10-CM | POA: Diagnosis not present

## 2017-10-25 LAB — CBC
HCT: 42.7 % (ref 39.0–52.0)
Hemoglobin: 13.8 g/dL (ref 13.0–17.0)
MCH: 30.1 pg (ref 26.0–34.0)
MCHC: 32.3 g/dL (ref 30.0–36.0)
MCV: 93.2 fL (ref 78.0–100.0)
Platelets: 166 10*3/uL (ref 150–400)
RBC: 4.58 MIL/uL (ref 4.22–5.81)
RDW: 15.7 % — ABNORMAL HIGH (ref 11.5–15.5)
WBC: 9.8 10*3/uL (ref 4.0–10.5)

## 2017-10-25 LAB — BASIC METABOLIC PANEL WITH GFR
Anion gap: 6 (ref 5–15)
BUN: 19 mg/dL (ref 6–20)
CO2: 29 mmol/L (ref 22–32)
Calcium: 8.7 mg/dL — ABNORMAL LOW (ref 8.9–10.3)
Chloride: 101 mmol/L (ref 101–111)
Creatinine, Ser: 1.22 mg/dL (ref 0.61–1.24)
GFR calc Af Amer: >60 mL/min
GFR calc non Af Amer: 57 mL/min — ABNORMAL LOW
Glucose, Bld: 130 mg/dL — ABNORMAL HIGH (ref 65–99)
Potassium: 4.5 mmol/L (ref 3.5–5.1)
Sodium: 136 mmol/L (ref 135–145)

## 2017-10-25 MED ORDER — OXYCODONE-ACETAMINOPHEN 5-325 MG PO TABS: 1.0000 | ORAL_TABLET | ORAL | 0 refills | Status: DC | PRN

## 2017-10-25 NOTE — Progress Notes (Signed)
Discharge instructions and medications discussed with patient.  Prescription & AVS given to patient. All questions answered.

## 2017-10-25 NOTE — Discharge Instructions (Signed)
Percutaneous Nephrostomy, Care After  This sheet gives you information about how to care for yourself after your procedure. Your health care provider may also give you more specific instructions. If you have problems or questions, contact your health care provider.  What can I expect after the procedure?  After the procedure, it is common to have:  · Some soreness where the nephrostomy tube was inserted (tube insertion site).  · Blood-tinged drainage from the nephrostomy tube for the first 24 hours.    Follow these instructions at home:  Activity  · Return to your normal activities as told by your health care provider. Ask your health care provider what activities are safe for you.  · Avoid activities that may cause the nephrostomy tubing to bend.  · Do not take baths, swim, or use a hot tub until your health care provider approves. Ask your health care provider if you can take showers. Cover the nephrostomy tube dressing with a watertight covering when you take a shower.  · Do not drive for 24 hours if you were given a medicine to help you relax (sedative).  Care of the tube insertion site  · Follow instructions from your health care provider about how to take care of your tube insertion site. Make sure you:  ? Wash your hands with soap and water before you change your bandage (dressing). If soap and water are not available, use hand sanitizer.  ? Change your dressing as told by your health care provider. Be careful not to pull on the tube while removing the dressing.  ? When you change the dressing, wash the skin around the tube, rinse well, and pat the skin dry.  · Check the tube insertion area every day for signs of infection. Check for:  ? More redness, swelling, or pain.  ? More fluid or blood.  ? Warmth.  ? Pus or a bad smell.  Care of the nephrostomy tube and drainage bag  · Always keep the tubing, the leg bag, or the bedside drainage bags below the level of the kidney so that your urine drains  freely.  · When connecting your nephrostomy tube to a drainage bag, make sure that there are no kinks in the tubing and that your urine is draining freely. You may want to use an elastic bandage to wrap any exposed tubing that goes from the nephrostomy tube to any of the connecting tubes.  · At night, you may want to connect your nephrostomy tube or the leg bag to a larger bedside drainage bag.  · Follow instructions from your health care provider about how to empty or change the drainage bag.  · Empty the drainage bag when it becomes ? full.  · Replace the drainage bag and any extension tubing that is connected to your nephrostomy tube every 3 weeks or as often as told by your health care provider. Your health care provider will explain how to change the drainage bag and extension tubing.  General instructions  · Take over-the-counter and prescription medicines only as told by your health care provider.  · Keep all follow-up visits as told by your health care provider. This is important.  Contact a health care provider if:  · You have problems with any of the valves or tubing.  · You have persistent pain or soreness in your back.  · You have more redness, swelling, or pain around your tube insertion site.  · You have more fluid or blood coming from   your tube insertion site.  · Your tube insertion site feels warm to the touch.  · You have pus or a bad smell coming from your tube insertion site.  · You have increased urine output or you feel burning when urinating.  Get help right away if:  · You have pain in your abdomen during the first week.  · You have chest pain or have trouble breathing.  · You have a new appearance of blood in your urine.  · You have a fever or chills.  · You have back pain that is not relieved by your medicine.  · You have decreased urine output.  · Your nephrostomy tube comes out.  This information is not intended to replace advice given to you by your health care provider. Make sure you  discuss any questions you have with your health care provider.  Document Released: 05/20/2004 Document Revised: 07/09/2016 Document Reviewed: 07/09/2016  Elsevier Interactive Patient Education © 2018 [employer]vier Inc.

## 2017-10-25 NOTE — Care Management Note (Signed)
Case Management Note  Patient Details  Name: [name] MRN: [id number] Date of Birth: 1945/10/18  Subjective/Objective:                    Action/Plan:d/c home.  Expected Discharge Date:  10/25/17               Expected Discharge Plan:  Home/Self Care  In-House Referral:     Discharge planning Services  CM Consult  Post Acute Care Choice:    Choice offered to:     DME Arranged:    DME Agency:     HH Arranged:    HH Agency:     Status of Service:  Completed, signed off  If discussed at [employer] of Stay Meetings, dates discussed:    Additional Comments:  Dessa Phi, RN 10/25/2017, 9:12 AM

## 2017-10-25 NOTE — Progress Notes (Signed)
Foley catheter discontinued per MD order. Patient tolerated well.

## 2017-10-26 ENCOUNTER — Telehealth: Payer: Self-pay | Admitting: *Deleted

## 2017-10-26 NOTE — Anesthesia Postprocedure Evaluation (Signed)
Anesthesia Post Note  Patient: [name]  Procedure(s) Performed: NEPHROLITHOTOMY PERCUTANEOUS (Left )     Patient location during evaluation: PACU Anesthesia Type: General Level of consciousness: awake Pain management: pain level controlled Vital Signs Assessment: post-procedure vital signs reviewed and stable Respiratory status: spontaneous breathing Cardiovascular status: stable Postop Assessment: no apparent nausea or vomiting Anesthetic complications: no    Last Vitals:  Vitals:   10/25/17 0700 10/25/17 1300  BP: (!) 143/60 139/72  Pulse: 93 88  Resp: 18   Temp: 36.9 C 36.9 C  SpO2: 98% 95%    Last Pain:  Vitals:   10/25/17 1300  TempSrc: Oral  PainSc:    Pain Goal: Patients Stated Pain Goal: 3 (10/24/17 1539)               [place]

## 2017-10-26 NOTE — Telephone Encounter (Signed)
Pt was on TCM list was admitted 10/24/16 for observation for left staghorn calculus. The risks/benefits/alternatives to L PCNL was explained to the patient and he understands and wishes to proceed with surgery. Pt D/C 10/25/17, and will f/u w/urologust on 10/27/17.Marland KitchenJohny Chess

## 2017-11-01 ENCOUNTER — Other Ambulatory Visit: Payer: Self-pay | Admitting: Urology

## 2017-11-02 ENCOUNTER — Other Ambulatory Visit: Payer: Self-pay

## 2017-11-02 ENCOUNTER — Encounter (HOSPITAL_COMMUNITY): Payer: Self-pay | Admitting: *Deleted

## 2017-11-03 NOTE — Discharge Summary (Signed)
Physician Discharge Summary  Patient ID: [name] MRN: [id number] DOB/AGE: 1945-10-15 72 y.o.  Admit date: 10/24/2017 Discharge date: 10/25/2017  Admission Diagnoses: nephrolithiasis Discharge Diagnoses:  Active Problems:   Nephrolithiasis   Discharged Condition: good  Hospital Course: The patient tolerated the procedure well and was transferred to the floor on IV pain meds, IV fluid. On POD#1 foley was removed, pt was started on regular diet and they ambulated in the halls.  Prior to discharge the pt was tolerating a regular diet, pain was controlled on PO pain meds, they were ambulating without difficulty, and they had normal bowel function.   Consults: None  Significant Diagnostic Studies: none  Treatments: surgery: PCNL  Discharge Exam: Blood pressure 139/72, pulse 88, temperature 98.4 F (36.9 C), temperature source Oral, resp. rate 18, height 6\' 4"  (1.93 m), weight 127 kg (280 lb), SpO2 95 %. General appearance: alert, cooperative and appears stated age Head: Normocephalic, without obvious abnormality, atraumatic Nose: Nares normal. Septum midline. Mucosa normal. No drainage or sinus tenderness. Resp: clear to auscultation bilaterally Cardio: regular rate and rhythm, S1, S2 normal, no murmur, click, rub or gallop GI: soft, non-tender; bowel sounds normal; no masses,  no organomegaly Extremities: extremities normal, atraumatic, no cyanosis or edema Neurologic: Grossly normal  Disposition: 01-Home or Self Care  Discharge Instructions    Discharge patient   Complete by:  As directed    Discharge disposition:  01-Home or Self Care   Discharge patient date:  10/25/2017     Allergies as of 10/25/2017      Reactions   Short Ragweed Pollen Ext Cough      Medication List    TAKE these medications   alfuzosin 10 MG 24 hr tablet Commonly known as:  UROXATRAL Take 1 tablet (10 mg total) at bedtime by mouth.   amoxicillin 500 MG capsule Commonly known as:   AMOXIL Take 2,000 mg by mouth See admin instructions. Takes 4 capsules 1 hour prior to dental procedures   amoxicillin-clavulanate 875-125 MG tablet Commonly known as:  AUGMENTIN Take 1 tablet by mouth daily at 12 noon.   atorvastatin 80 MG tablet Commonly known as:  LIPITOR Take 1 tablet (80 mg total) 2 (two) times a week by mouth. Monday & Friday evenings ONLY   buPROPion 300 MG 24 hr tablet Commonly known as:  WELLBUTRIN XL Take 1 tablet (300 mg total) daily with breakfast by mouth. What changed:  when to take this   CALCIUM CITRATE + D PO Take 1 tablet by mouth 2 (two) times daily.   clonazePAM 1 MG tablet Commonly known as:  KLONOPIN Take one tablet by mouth every 6 hours What changed:    how much to take  how to take this  when to take this  reasons to take this  additional instructions   denosumab 60 MG/ML Soln injection Commonly known as:  PROLIA Inject 60 mg into the skin every 6 (six) months. Administer in upper arm, thigh, or abdomen   diltiazem 240 MG 24 hr capsule Commonly known as:  CARDIZEM CD Take 1 capsule (240 mg total) daily by mouth. What changed:    when to take this  additional instructions   doxycycline 100 MG tablet Commonly known as:  VIBRA-TABS Take 1 tablet (100 mg total) daily by mouth.   DULoxetine 60 MG capsule Commonly known as:  CYMBALTA Take 1 capsule (60 mg total) 2 (two) times daily by mouth.   ENBREL 50 MG/ML injection Generic drug:  etanercept Inject 50 mg into the skin 2 (two) times a week. Tuesday & Friday   EPIPEN 2-PAK 0.3 mg/0.3 mL Soaj injection Generic drug:  EPINEPHrine Inject 0.3 mg into the muscle daily as needed (for anaphylactic reaction).   HYDROcodone-acetaminophen 7.5-325 MG tablet Commonly known as:  NORCO Take 1 tablet every 6 (six) hours as needed by mouth for severe pain. It is a 3 months supply   IRON 27 PO Take 1 tablet by mouth 2 (two) times daily.   levothyroxine 200 MCG tablet Commonly  known as:  SYNTHROID, LEVOTHROID Take 1 tablet (200 mcg total) daily at 6 (six) AM by mouth. (0500) What changed:  additional instructions   levothyroxine 25 MCG tablet Commonly known as:  SYNTHROID, LEVOTHROID Take 1 tablet (25 mcg total) daily before breakfast by mouth. What changed:    when to take this  additional instructions   loratadine 10 MG tablet Commonly known as:  CLARITIN Take 10 mg by mouth daily.   multivitamin with minerals Tabs tablet Take 1 tablet by mouth daily. Men's One-A-Day 50+   nitroGLYCERIN 0.4 MG SL tablet Commonly known as:  NITROSTAT Place 0.4 mg under the tongue every 5 (five) minutes as needed for chest pain. x3 doses as needed for chest pain   omeprazole 40 MG capsule Commonly known as:  PRILOSEC Take 1 capsule (40 mg total) daily before breakfast by mouth. What changed:  when to take this   oxyCODONE-acetaminophen 5-325 MG tablet Commonly known as:  PERCOCET/ROXICET Take 1-2 tablets by mouth every 4 (four) hours as needed for moderate pain.   polyethylene glycol packet Commonly known as:  MIRALAX / GLYCOLAX Take 17 g by mouth daily as needed for mild constipation.   PROBIOTIC PO Take 1 capsule by mouth daily with breakfast.   REFRESH 1.4-0.6 % Soln Generic drug:  Polyvinyl Alcohol-Povidone PF Place 1 drop into both eyes daily as needed (for dry eyes.).   rivaroxaban 20 MG Tabs tablet Commonly known as:  XARELTO Take 20 mg by mouth daily with breakfast.   rivaroxaban 10 MG Tabs tablet Commonly known as:  XARELTO Take 1 tablet (10 mg total) daily by mouth.   Suvorexant 15 MG Tabs Commonly known as:  BELSOMRA Take 15 mg by mouth at bedtime as needed. What changed:  reasons to take this   testosterone cypionate 200 MG/ML injection Commonly known as:  DEPOTESTOSTERONE CYPIONATE Inject 200mg  (72ml) intramuscularly every 14 days What changed:    how much to take  how to take this  when to take this  additional instructions    tiZANidine 4 MG tablet Commonly known as:  ZANAFLEX Take 1 tablet (4 mg total) every 6 (six) hours as needed by mouth for muscle spasms.   vitamin C 500 MG tablet Commonly known as:  ASCORBIC ACID Take 500 mg by mouth daily.      Follow-up Information    Lavilla Delamora, Candee Furbish, MD. Call on 10/27/2017.   Specialty:  Urology Contact information: [address] [place] [address] [phone]           Signed: [name] 11/03/2017, 2:06 PM

## 2017-11-07 ENCOUNTER — Ambulatory Visit (HOSPITAL_COMMUNITY): Payer: Medicare Other | Admitting: Certified Registered Nurse Anesthetist

## 2017-11-07 ENCOUNTER — Ambulatory Visit (HOSPITAL_COMMUNITY)
Admission: RE | Admit: 2017-11-07 | Discharge: 2017-11-07 | Disposition: A | Discharge status: Home / Self Care | Payer: Medicare Other | Source: Ambulatory Visit | Attending: Urology | Admitting: Urology

## 2017-11-07 ENCOUNTER — Encounter (HOSPITAL_COMMUNITY): Payer: Self-pay | Admitting: *Deleted

## 2017-11-07 ENCOUNTER — Encounter (HOSPITAL_COMMUNITY)
Admission: RE | Disposition: A | Discharge status: Home / Self Care | Payer: Self-pay | Source: Ambulatory Visit | Attending: Urology

## 2017-11-07 ENCOUNTER — Ambulatory Visit (HOSPITAL_COMMUNITY): Payer: Medicare Other

## 2017-11-07 DIAGNOSIS — D696 Thrombocytopenia, unspecified: Secondary | ICD-10-CM | POA: Diagnosis not present

## 2017-11-07 DIAGNOSIS — E785 Hyperlipidemia, unspecified: Secondary | ICD-10-CM | POA: Insufficient documentation

## 2017-11-07 DIAGNOSIS — M1990 Unspecified osteoarthritis, unspecified site: Secondary | ICD-10-CM | POA: Insufficient documentation

## 2017-11-07 DIAGNOSIS — I10 Essential (primary) hypertension: Secondary | ICD-10-CM | POA: Diagnosis not present

## 2017-11-07 DIAGNOSIS — R310 Gross hematuria: Secondary | ICD-10-CM | POA: Diagnosis not present

## 2017-11-07 DIAGNOSIS — N200 Calculus of kidney: Secondary | ICD-10-CM | POA: Insufficient documentation

## 2017-11-07 DIAGNOSIS — Z8261 Family history of arthritis: Secondary | ICD-10-CM | POA: Insufficient documentation

## 2017-11-07 DIAGNOSIS — R2681 Unsteadiness on feet: Secondary | ICD-10-CM | POA: Insufficient documentation

## 2017-11-07 DIAGNOSIS — J449 Chronic obstructive pulmonary disease, unspecified: Secondary | ICD-10-CM | POA: Insufficient documentation

## 2017-11-07 DIAGNOSIS — M545 Low back pain: Secondary | ICD-10-CM | POA: Insufficient documentation

## 2017-11-07 DIAGNOSIS — M810 Age-related osteoporosis without current pathological fracture: Secondary | ICD-10-CM | POA: Diagnosis not present

## 2017-11-07 DIAGNOSIS — Z96652 Presence of left artificial knee joint: Secondary | ICD-10-CM | POA: Insufficient documentation

## 2017-11-07 DIAGNOSIS — K219 Gastro-esophageal reflux disease without esophagitis: Secondary | ICD-10-CM | POA: Insufficient documentation

## 2017-11-07 DIAGNOSIS — F329 Major depressive disorder, single episode, unspecified: Secondary | ICD-10-CM | POA: Insufficient documentation

## 2017-11-07 DIAGNOSIS — Z9181 History of falling: Secondary | ICD-10-CM | POA: Insufficient documentation

## 2017-11-07 DIAGNOSIS — I2510 Atherosclerotic heart disease of native coronary artery without angina pectoris: Secondary | ICD-10-CM | POA: Insufficient documentation

## 2017-11-07 DIAGNOSIS — L400 Psoriasis vulgaris: Secondary | ICD-10-CM | POA: Diagnosis not present

## 2017-11-07 DIAGNOSIS — Z9842 Cataract extraction status, left eye: Secondary | ICD-10-CM | POA: Diagnosis not present

## 2017-11-07 DIAGNOSIS — M898X9 Other specified disorders of bone, unspecified site: Secondary | ICD-10-CM | POA: Diagnosis not present

## 2017-11-07 DIAGNOSIS — Z87442 Personal history of urinary calculi: Secondary | ICD-10-CM | POA: Insufficient documentation

## 2017-11-07 DIAGNOSIS — Z96643 Presence of artificial hip joint, bilateral: Secondary | ICD-10-CM | POA: Insufficient documentation

## 2017-11-07 DIAGNOSIS — G4730 Sleep apnea, unspecified: Secondary | ICD-10-CM | POA: Diagnosis not present

## 2017-11-07 DIAGNOSIS — F419 Anxiety disorder, unspecified: Secondary | ICD-10-CM | POA: Diagnosis not present

## 2017-11-07 DIAGNOSIS — I499 Cardiac arrhythmia, unspecified: Secondary | ICD-10-CM | POA: Insufficient documentation

## 2017-11-07 DIAGNOSIS — M4856XS Collapsed vertebra, not elsewhere classified, lumbar region, sequela of fracture: Secondary | ICD-10-CM | POA: Insufficient documentation

## 2017-11-07 DIAGNOSIS — E7800 Pure hypercholesterolemia, unspecified: Secondary | ICD-10-CM | POA: Insufficient documentation

## 2017-11-07 DIAGNOSIS — N400 Enlarged prostate without lower urinary tract symptoms: Secondary | ICD-10-CM | POA: Diagnosis not present

## 2017-11-07 DIAGNOSIS — Z6835 Body mass index (BMI) 35.0-35.9, adult: Secondary | ICD-10-CM | POA: Insufficient documentation

## 2017-11-07 DIAGNOSIS — Z811 Family history of alcohol abuse and dependence: Secondary | ICD-10-CM | POA: Insufficient documentation

## 2017-11-07 DIAGNOSIS — J301 Allergic rhinitis due to pollen: Secondary | ICD-10-CM | POA: Insufficient documentation

## 2017-11-07 DIAGNOSIS — Z825 Family history of asthma and other chronic lower respiratory diseases: Secondary | ICD-10-CM | POA: Insufficient documentation

## 2017-11-07 DIAGNOSIS — Z8249 Family history of ischemic heart disease and other diseases of the circulatory system: Secondary | ICD-10-CM | POA: Insufficient documentation

## 2017-11-07 DIAGNOSIS — Z87891 Personal history of nicotine dependence: Secondary | ICD-10-CM | POA: Insufficient documentation

## 2017-11-07 DIAGNOSIS — E890 Postprocedural hypothyroidism: Secondary | ICD-10-CM | POA: Diagnosis not present

## 2017-11-07 DIAGNOSIS — Z9841 Cataract extraction status, right eye: Secondary | ICD-10-CM | POA: Insufficient documentation

## 2017-11-07 HISTORY — PX: CYSTOSCOPY WITH RETROGRADE PYELOGRAM, URETEROSCOPY AND STENT PLACEMENT: SHX5789

## 2017-11-07 SURGERY — CYSTOURETEROSCOPY, WITH RETROGRADE PYELOGRAM AND STENT INSERTION
Anesthesia: General | Laterality: Left

## 2017-11-07 MED ORDER — MEPERIDINE HCL 50 MG/ML IJ SOLN: 6.2500 mg | INTRAMUSCULAR | Status: DC | PRN

## 2017-11-07 MED ORDER — PROPOFOL 10 MG/ML IV BOLUS
INTRAVENOUS | Status: AC
  Filled 2017-11-07: qty 20

## 2017-11-07 MED ORDER — FENTANYL CITRATE (PF) 100 MCG/2ML IJ SOLN: 25.0000 ug | INTRAMUSCULAR | Status: DC | PRN

## 2017-11-07 MED ORDER — FENTANYL CITRATE (PF) 100 MCG/2ML IJ SOLN
INTRAMUSCULAR | Status: AC
  Filled 2017-11-07: qty 2

## 2017-11-07 MED ORDER — MIDAZOLAM HCL 2 MG/2ML IJ SOLN: 0.5000 mg | Freq: Once | INTRAMUSCULAR | Status: DC | PRN

## 2017-11-07 MED ORDER — SODIUM CHLORIDE 0.9 % IR SOLN
Status: DC | PRN
  Administered 2017-11-07: 6000 mL

## 2017-11-07 MED ORDER — TAMSULOSIN HCL 0.4 MG PO CAPS: 0.4000 mg | ORAL_CAPSULE | Freq: Every day | ORAL | 0 refills | Status: DC

## 2017-11-07 MED ORDER — EPHEDRINE SULFATE-NACL 50-0.9 MG/10ML-% IV SOSY
PREFILLED_SYRINGE | INTRAVENOUS | Status: DC | PRN
  Administered 2017-11-07: 5 mg via INTRAVENOUS

## 2017-11-07 MED ORDER — PROMETHAZINE HCL 25 MG/ML IJ SOLN: 6.2500 mg | INTRAMUSCULAR | Status: DC | PRN

## 2017-11-07 MED ORDER — LIDOCAINE 2% (20 MG/ML) 5 ML SYRINGE
INTRAMUSCULAR | Status: DC | PRN
  Administered 2017-11-07: 40 mg via INTRAVENOUS

## 2017-11-07 MED ORDER — DEXAMETHASONE SODIUM PHOSPHATE 10 MG/ML IJ SOLN
INTRAMUSCULAR | Status: AC
  Filled 2017-11-07: qty 1

## 2017-11-07 MED ORDER — IOHEXOL 300 MG/ML  SOLN
INTRAMUSCULAR | Status: DC | PRN
  Administered 2017-11-07: 5 mL

## 2017-11-07 MED ORDER — LACTATED RINGERS IV SOLN: INTRAVENOUS | Status: DC

## 2017-11-07 MED ORDER — OXYCODONE-ACETAMINOPHEN 5-325 MG PO TABS: 1.0000 | ORAL_TABLET | ORAL | 0 refills | Status: DC | PRN

## 2017-11-07 MED ORDER — EPHEDRINE 5 MG/ML INJ
INTRAVENOUS | Status: AC
  Filled 2017-11-07: qty 10

## 2017-11-07 MED ORDER — FENTANYL CITRATE (PF) 100 MCG/2ML IJ SOLN
INTRAMUSCULAR | Status: DC | PRN
  Administered 2017-11-07: 25 ug via INTRAVENOUS
  Administered 2017-11-07: 50 ug via INTRAVENOUS
  Administered 2017-11-07 (×4): 25 ug via INTRAVENOUS

## 2017-11-07 MED ORDER — DEXTROSE 5 % IV SOLN
2.0000 g | INTRAVENOUS | Status: AC
  Administered 2017-11-07: 2 g via INTRAVENOUS
  Filled 2017-11-07: qty 2

## 2017-11-07 MED ORDER — ONDANSETRON HCL 4 MG/2ML IJ SOLN
INTRAMUSCULAR | Status: DC | PRN
  Administered 2017-11-07: 4 mg via INTRAVENOUS

## 2017-11-07 MED ORDER — PROPOFOL 10 MG/ML IV BOLUS
INTRAVENOUS | Status: DC | PRN
  Administered 2017-11-07: 200 mg via INTRAVENOUS

## 2017-11-07 MED ORDER — DEXAMETHASONE SODIUM PHOSPHATE 4 MG/ML IJ SOLN
INTRAMUSCULAR | Status: DC | PRN
  Administered 2017-11-07: 4 mg via INTRAVENOUS

## 2017-11-07 SURGICAL SUPPLY — 24 items
BAG URO CATCHER STRL LF (MISCELLANEOUS) ×3 IMPLANT
CATH INTERMIT  6FR 70CM (CATHETERS) ×3 IMPLANT
CLOTH BEACON ORANGE TIMEOUT ST (SAFETY) ×3 IMPLANT
COVER FOOTSWITCH UNIV (MISCELLANEOUS) IMPLANT
COVER SURGICAL LIGHT HANDLE (MISCELLANEOUS) ×3 IMPLANT
EXTRACTOR STONE NITINOL NGAGE (UROLOGICAL SUPPLIES) ×2 IMPLANT
FIBER LASER FLEXIVA 1000 (UROLOGICAL SUPPLIES) IMPLANT
FIBER LASER FLEXIVA 365 (UROLOGICAL SUPPLIES) IMPLANT
FIBER LASER FLEXIVA 550 (UROLOGICAL SUPPLIES) IMPLANT
FIBER LASER TRAC TIP (UROLOGICAL SUPPLIES) IMPLANT
GLOVE BIO SURGEON STRL SZ8 (GLOVE) ×3 IMPLANT
GOWN STRL REUS W/TWL XL LVL3 (GOWN DISPOSABLE) ×3 IMPLANT
GUIDEWIRE ANG ZIPWIRE 038X150 (WIRE) ×3 IMPLANT
GUIDEWIRE STR DUAL SENSOR (WIRE) ×2 IMPLANT
IV NS 1000ML (IV SOLUTION) ×3
IV NS 1000ML BAXH (IV SOLUTION) ×1 IMPLANT
MANIFOLD NEPTUNE II (INSTRUMENTS) ×3 IMPLANT
PACK CYSTO (CUSTOM PROCEDURE TRAY) ×3 IMPLANT
SHEATH URETERAL 12FRX35CM (MISCELLANEOUS) ×2 IMPLANT
STENT CONTOUR 6FRX26X.038 (STENTS) ×2 IMPLANT
SYR 10ML LL (SYRINGE) ×2 IMPLANT
TUBE FEEDING 8FR 16IN STR KANG (MISCELLANEOUS) ×2 IMPLANT
TUBING CONNECTING 10 (TUBING) ×2 IMPLANT
TUBING CONNECTING 10' (TUBING) ×1

## 2017-11-07 NOTE — Interval H&P Note (Signed)
History and Physical Interval Note:  11/07/2017 2:32 PM  [name]  has presented today for surgery, with the diagnosis of LEFT RENAL CALCULI  The various methods of treatment have been discussed with the patient and family. After consideration of risks, benefits and other options for treatment, the patient has consented to  Procedure(s): CYSTOSCOPY WITH RETROGRADE PYELOGRAM, URETEROSCOPY AND STENT PLACEMENT (Left) HOLMIUM LASER APPLICATION (Left) as a surgical intervention .  The patient's history has been reviewed, patient examined, no change in status, stable for surgery.  I have reviewed the patient's chart and labs.  Questions were answered to the patient's satisfaction.     [name]

## 2017-11-07 NOTE — Discharge Instructions (Signed)
Ureteral Stent Implantation, Care After °Refer to this sheet in the next few weeks. These instructions provide you with information about caring for yourself after your procedure. Your health care provider may also give you more specific instructions. Your treatment has been planned according to current medical practices, but problems sometimes occur. Call your health care provider if you have any problems or questions after your procedure. °What can I expect after the procedure? °After the procedure, it is common to have: °· Nausea. °· Mild pain when you urinate. You may feel this pain in your lower back or lower abdomen. Pain should stop within a few minutes after you urinate. This may last for up to 1 week. °· A small amount of blood in your urine for several days. ° °Follow these instructions at home: ° °Medicines °· Take over-the-counter and prescription medicines only as told by your health care provider. °· If you were prescribed an antibiotic medicine, take it as told by your health care provider. Do not stop taking the antibiotic even if you start to feel better. °· Do not drive for 24 hours if you received a sedative. °· Do not drive or operate heavy machinery while taking prescription pain medicines. °Activity °· Return to your normal activities as told by your health care provider. Ask your health care provider what activities are safe for you. °· Do not lift anything that is heavier than 10 lb (4.5 kg). Follow this limit for 1 week after your procedure, or for as long as told by your health care provider. °General instructions °· Watch for any blood in your urine. Call your health care provider if the amount of blood in your urine increases. °· If you have a catheter: °? Follow instructions from your health care provider about taking care of your catheter and collection bag. °? Do not take baths, swim, or use a hot tub until your health care provider approves. °· Drink enough fluid to keep your urine  clear or pale yellow. °· Keep all follow-up visits as told by your health care provider. This is important. °Contact a health care provider if: °· You have pain that gets worse or does not get better with medicine, especially pain when you urinate. °· You have difficulty urinating. °· You feel nauseous or you vomit repeatedly during a period of more than 2 days after the procedure. °Get help right away if: °· Your urine is dark red or has blood clots in it. °· You are leaking urine (have incontinence). °· The end of the stent comes out of your urethra. °· You cannot urinate. °· You have sudden, sharp, or severe pain in your abdomen or lower back. °· You have a fever. °This information is not intended to replace advice given to you by your health care provider. Make sure you discuss any questions you have with your health care provider. °Document Released: 05/30/2013 Document Revised: 03/04/2016 Document Reviewed: 04/11/2015 °Elsevier Interactive Patient Education © 2018 [employer] ° °PLEASE REMOVE YOUR STENT IN 72 HOURS BY GENTLY PULLING THE STRING ° °

## 2017-11-07 NOTE — Anesthesia Postprocedure Evaluation (Signed)
Anesthesia Post Note  Patient: [name]  Procedure(s) Performed: CYSTOSCOPY WITH RETROGRADE PYELOGRAM, URETEROSCOPY AND STENT PLACEMENT (Left )     Patient location during evaluation: PACU Anesthesia Type: General Level of consciousness: awake and alert, oriented and patient cooperative Pain management: pain level controlled Vital Signs Assessment: post-procedure vital signs reviewed and stable Respiratory status: spontaneous breathing, nonlabored ventilation and respiratory function stable Cardiovascular status: blood pressure returned to baseline and stable Postop Assessment: no apparent nausea or vomiting Anesthetic complications: no    Last Vitals:  Vitals:   11/07/17 1658 11/07/17 1700  BP: (!) 158/84 (!) 151/96  Pulse: 79 86  Resp: 17 14  Temp: 36.7 C   SpO2: 100% 100%    Last Pain:  Vitals:   11/07/17 1658  TempSrc:   PainSc: 0-No pain                 Keyani Rigdon,E. Lasaro Primm

## 2017-11-07 NOTE — Anesthesia Procedure Notes (Signed)
Procedure Name: LMA Insertion Date/Time: 11/07/2017 4:11 PM Performed by: Claudia Desanctis, CRNA Pre-anesthesia Checklist: Emergency Drugs available, Patient identified, Suction available and Patient being monitored Patient Re-evaluated:Patient Re-evaluated prior to induction Oxygen Delivery Method: Circle system utilized Preoxygenation: Pre-oxygenation with 100% oxygen Induction Type: IV induction Ventilation: Mask ventilation without difficulty LMA: LMA inserted LMA Size: 5.0 Number of attempts: 1 Placement Confirmation: positive ETCO2 and breath sounds checked- equal and bilateral Tube secured with: Tape Dental Injury: Teeth and Oropharynx as per pre-operative assessment

## 2017-11-07 NOTE — Op Note (Signed)
.  Preoperative diagnosis: Left renal stones  Postoperative diagnosis: Same  Procedure: 1 cystoscopy 2. Left retrograde pyelography 3.  Intraoperative fluoroscopy, under one hour, with interpretation 4.  Left ureteroscopic stone manipulation with lbasket extraction 5.  Left 6 x 26 JJ stent exchange  Attending: Rosie Fate  Anesthesia: General  Estimated blood loss: None  Drains: Left 6 x 26 JJ ureteral stent with tether  Specimens: stone for analysis  Antibiotics: rocephin  Findings: left lower and upper pole stones. No hydronephrosis. No masses/lesions in the bladder. Ureteral orifices in normal anatomic location.  Indications: Patient is a 72 year old male with a history of left renal stone and who underwent percutaneous nephrostolithotomy. He has residual stone burden. After discussing treatment options, he decided proceed with left ureteroscopic stone manipulation.  Procedure her in detail: The patient was brought to the operating room and a brief timeout was done to ensure correct patient, correct procedure, correct site.  General anesthesia was administered patient was placed in dorsal lithotomy position.  Her genitalia was then prepped and draped in usual sterile fashion.  A rigid 58 French cystoscope was passed in the urethra and the bladder.  Bladder was inspected free masses or lesions.  the ureteral orifices were in the normal orthotopic locations.  a 6 french ureteral catheter was then instilled into the left ureteral orifice.  a gentle retrograde was obtained and findings noted above.  we then placed a zip wire through the ureteral catheter and advanced up to the renal pelvis.  we then removed the cystoscope and cannulated the left ureteral orifice with a semirigid ureteroscope.  No stone was found in the ureter. Once we reached the UPJ a sensor wire was advanced in to the renal pelvis. We then removed the ureteroscope and advanced am 12/14 x 35cm access sheath up to the  renal pelvis. We then used the flexible ureteroscope to perform nephroscopy. We encountered the stone in the lower pole.    the pieces were then removed with a Ngage basket.    once all stone fragments were removed we then removed the access sheath under direct vision and noted no injury to the ureter. We then placed a 6 x 26 double-j ureteral stent over the original zip wire.  We then removed the wire and good coil was noted in the the renal pelvis under fluoroscopy and the bladder under direct vision. the bladder was then drained and this concluded the procedure which was well tolerated by patient.  Complications: None  Condition: Stable, extubated, transferred to PACU  Plan: Patient is to be discharged home as to follow-up in one week. He is to remove his stent by pulling the tether in 72 hours

## 2017-11-07 NOTE — Transfer of Care (Signed)
Immediate Anesthesia Transfer of Care Note  Patient: [name]  Procedure(s) Performed: CYSTOSCOPY WITH RETROGRADE PYELOGRAM, URETEROSCOPY AND STENT PLACEMENT (Left )  Patient Location: PACU  Anesthesia Type:General  Level of Consciousness: awake, alert , oriented and patient cooperative  Airway & Oxygen Therapy: Patient Spontanous Breathing and Patient connected to face mask  Post-op Assessment: Report given to RN and Post -op Vital signs reviewed and stable  Post vital signs: Reviewed and stable  Last Vitals:  Vitals:   11/07/17 1349  BP: 131/85  Pulse: 82  Resp: 18  Temp: 36.8 C  SpO2: 99%    Last Pain:  Vitals:   11/07/17 1349  TempSrc: Oral         Complications: No apparent anesthesia complications

## 2017-11-07 NOTE — Anesthesia Preprocedure Evaluation (Addendum)
Anesthesia Evaluation  Patient identified by MRN, date of birth, ID band Patient awake    Reviewed: Allergy & Precautions, NPO status , Patient's Chart, lab work & pertinent test results  History of Anesthesia Complications Negative for: history of anesthetic complications  Airway Mallampati: I  TM Distance: >3 FB Neck ROM: Full    Dental  (+) Poor Dentition, Chipped, Missing, Dental Advisory Given   Pulmonary sleep apnea and Continuous Positive Airway Pressure Ventilation , COPD, former smoker (quit 2013),    breath sounds clear to auscultation       Cardiovascular hypertension, Pt. on medications (-) angina+ CAD (atrial fibrillation diagnosis he also underwent a heart catheterization. He denies any chest pain at the time. He reportedly had 90% obstruction of an unknown vessel that had developed collaterals. )  + dysrhythmias (converted) Atrial Fibrillation  Rhythm:Regular Rate:Normal  11/03/16 ECHO: EF 60-65%, atrial dilation, valves OK   Neuro/Psych Anxiety Depression negative neurological ROS     GI/Hepatic Neg liver ROS, GERD  Medicated and Controlled,  Endo/Other  Hypothyroidism Morbid obesity  Renal/GU Renal InsufficiencyRenal disease     Musculoskeletal  (+) Arthritis ,   Abdominal (+) + obese,   Peds  Hematology  (+) Blood dyscrasia (h/o thrombocytopenia: plt now 166k), , xarelto   Anesthesia Other Findings   Reproductive/Obstetrics                            Anesthesia Physical Anesthesia Plan  ASA: III  Anesthesia Plan: General   Post-op Pain Management:    Induction: Intravenous  PONV Risk Score and Plan: 2 and Treatment may vary due to age or medical condition, Ondansetron and Dexamethasone  Airway Management Planned: LMA  Additional Equipment:   Intra-op Plan:   Post-operative Plan:   Informed Consent: I have reviewed the patients History and Physical, chart, labs  and discussed the procedure including the risks, benefits and alternatives for the proposed anesthesia with the patient or authorized representative who has indicated his/her understanding and acceptance.   Dental advisory given  Plan Discussed with: CRNA and Surgeon  Anesthesia Plan Comments: (Plan routine monitors, GA- LMA OK)       Anesthesia Quick Evaluation

## 2017-11-08 ENCOUNTER — Encounter (HOSPITAL_COMMUNITY): Payer: Self-pay | Admitting: Urology

## 2017-11-14 ENCOUNTER — Ambulatory Visit: Payer: Medicare Other | Admitting: Internal Medicine

## 2017-11-17 ENCOUNTER — Ambulatory Visit: Payer: Medicare Other | Admitting: Internal Medicine

## 2017-11-22 ENCOUNTER — Telehealth: Payer: Self-pay

## 2017-11-22 ENCOUNTER — Ambulatory Visit: Payer: Medicare Other | Admitting: Internal Medicine

## 2017-11-22 ENCOUNTER — Encounter: Payer: Self-pay | Admitting: Internal Medicine

## 2017-11-22 DIAGNOSIS — E291 Testicular hypofunction: Secondary | ICD-10-CM

## 2017-11-22 DIAGNOSIS — M79604 Pain in right leg: Secondary | ICD-10-CM

## 2017-11-22 DIAGNOSIS — M545 Low back pain: Secondary | ICD-10-CM | POA: Diagnosis not present

## 2017-11-22 DIAGNOSIS — N200 Calculus of kidney: Secondary | ICD-10-CM | POA: Diagnosis not present

## 2017-11-22 DIAGNOSIS — E785 Hyperlipidemia, unspecified: Secondary | ICD-10-CM | POA: Diagnosis not present

## 2017-11-22 MED ORDER — HYDROCODONE-ACETAMINOPHEN 7.5-325 MG PO TABS: 1.0000 | ORAL_TABLET | Freq: Four times a day (QID) | ORAL | 0 refills | Status: DC | PRN

## 2017-11-22 MED ORDER — TIZANIDINE HCL 4 MG PO TABS: 4.0000 mg | ORAL_TABLET | Freq: Four times a day (QID) | ORAL | 1 refills | Status: DC | PRN

## 2017-11-22 MED ORDER — TESTOSTERONE CYPIONATE 200 MG/ML IM SOLN: INTRAMUSCULAR | 5 refills | Status: DC

## 2017-11-22 NOTE — Assessment & Plan Note (Signed)
Lipitor 

## 2017-11-22 NOTE — Assessment & Plan Note (Signed)
Chronic On Testosterone  Potential benefits of a long term testosterone use as well as potential risks (BPH, MI, OSA, cancer)  and complications were explained to the patient and were aknowledged.

## 2017-11-22 NOTE — Patient Instructions (Signed)
Hip opener exercises Chair yoga

## 2017-11-22 NOTE — Telephone Encounter (Signed)
Testosterone rx script faxed to [facility] [phone] per dr Alain Marion request

## 2017-11-22 NOTE — Assessment & Plan Note (Signed)
Norco prn  Potential benefits of a long term opioids use as well as potential risks (i.e. addiction risk, apnea etc) and complications (i.e. Somnolence, constipation and others) were explained to the patient and were aknowledged. 

## 2017-11-22 NOTE — Progress Notes (Signed)
Subjective:  Patient ID: [name], male    DOB: September 16, 1946  Age: 72 y.o. MRN: [id number]  CC: No chief complaint on file.   HPI FRANKI STEMEN presents for kidney stones, dyslipidemia, HTN, OA/LBP f/u  Outpatient Medications Prior to Visit  Medication Sig Dispense Refill  . amoxicillin (AMOXIL) 500 MG capsule Take 2,000 mg by mouth See admin instructions. Takes 4 capsules 1 hour prior to dental procedures    . atorvastatin (LIPITOR) 80 MG tablet Take 1 tablet (80 mg total) 2 (two) times a week by mouth. Monday & Friday evenings ONLY 90 tablet 3  . Calcium Citrate-Vitamin D (CALCIUM CITRATE + D PO) Take 1 tablet by mouth 2 (two) times daily.     . Cholecalciferol (VITAMIN D-3) 5000 units TABS Take 5,000 Units by mouth daily.    . clonazePAM (KLONOPIN) 1 MG tablet Take one tablet by mouth every 6 hours (Patient taking differently: Take 1 mg by mouth every 6 (six) hours as needed for anxiety. ) 120 tablet 1  . denosumab (PROLIA) 60 MG/ML SOLN injection Inject 60 mg into the skin every 6 (six) months. Administer in upper arm, thigh, or abdomen    . diltiazem (CARDIZEM CD) 240 MG 24 hr capsule Take 1 capsule (240 mg total) daily by mouth. (Patient taking differently: Take 240 mg by mouth daily with breakfast. (0800-0830)) 90 capsule 3  . doxycycline (VIBRA-TABS) 100 MG tablet Take 1 tablet (100 mg total) daily by mouth. 90 tablet 3  . DULoxetine (CYMBALTA) 60 MG capsule Take 1 capsule (60 mg total) 2 (two) times daily by mouth. 180 capsule 3  . EPINEPHrine (EPIPEN 2-PAK) 0.3 mg/0.3 mL IJ SOAJ injection Inject 0.3 mg into the muscle daily as needed (for anaphylactic reaction).    [name] etanercept (ENBREL) 50 MG/ML injection Inject 50 mg into the skin 2 (two) times a week. Tuesday & Friday    . ferrous sulfate 325 (65 FE) MG tablet Take 325 mg by mouth 2 (two) times daily with a meal.    . HYDROcodone-acetaminophen (NORCO) 7.5-325 MG tablet Take 1 tablet every 6 (six) hours as needed by mouth  for severe pain. It is a 3 months supply 360 tablet 0  . levothyroxine (SYNTHROID, LEVOTHROID) 200 MCG tablet Take 1 tablet (200 mcg total) daily at 6 (six) AM by mouth. (0500) (Patient taking differently: Take 200 mcg by mouth daily at 6 (six) AM. (0500) on empty stomach Takes a 200 mcg and  25 mcg tablet daily to equal 225 mcg) 90 tablet 3  . levothyroxine (SYNTHROID, LEVOTHROID) 25 MCG tablet Take 1 tablet (25 mcg total) daily before breakfast by mouth. (Patient taking differently: Take 25 mcg by mouth daily at 6 (six) AM. (0500) on empty stomach Takes a 200 mcg and  25 mcg tablet daily to equal 225 mcg) 90 tablet 3  . loratadine (CLARITIN) 10 MG tablet Take 10 mg by mouth daily.     . mirabegron ER (MYRBETRIQ) 25 MG TB24 tablet Take 25 mg by mouth at bedtime.    . Multiple Vitamin (MULTIVITAMIN WITH MINERALS) TABS tablet Take 1 tablet by mouth daily. Men's One-A-Day 78+    . nitroGLYCERIN (NITROSTAT) 0.4 MG SL tablet Place 0.4 mg under the tongue every 5 (five) minutes as needed for chest pain. x3 doses as needed for chest pain    . omeprazole (PRILOSEC) 40 MG capsule Take 1 capsule (40 mg total) daily before breakfast by mouth. (Patient taking differently: Take  40 mg by mouth daily with breakfast. ) 90 capsule 3  . oxyCODONE-acetaminophen (PERCOCET/ROXICET) 5-325 MG tablet Take 1-2 tablets by mouth every 4 (four) hours as needed for moderate pain. 30 tablet 0  . polyethylene glycol (MIRALAX / GLYCOLAX) packet Take 17 g by mouth daily as needed for mild constipation.     . Polyvinyl Alcohol-Povidone PF (REFRESH) 1.4-0.6 % SOLN Place 1 drop into both eyes daily as needed (for dry eyes.).     [name] Probiotic Product (PROBIOTIC PO) Take 1 capsule by mouth daily with breakfast.    . rivaroxaban (XARELTO) 20 MG TABS tablet Take 20 mg by mouth daily with breakfast.    . Suvorexant (BELSOMRA) 15 MG TABS Take 15 mg by mouth at bedtime as needed. (Patient taking differently: Take 15 mg by mouth at bedtime as  needed (for sleep.). ) 90 tablet 1  . testosterone cypionate (DEPOTESTOSTERONE CYPIONATE) 200 MG/ML injection Inject 200mg  (73ml) intramuscularly every 14 days (Patient taking differently: Inject 200 mg into the muscle every 14 (fourteen) days. Inject 200mg  (77ml) intramuscularly every 14 days--every other Friday) 5 mL 5  . tiZANidine (ZANAFLEX) 4 MG tablet Take 1 tablet (4 mg total) every 6 (six) hours as needed by mouth for muscle spasms. 180 tablet 1  . vitamin C (ASCORBIC ACID) 500 MG tablet Take 500 mg by mouth daily.    . tamsulosin (FLOMAX) 0.4 MG CAPS capsule Take 1 capsule (0.4 mg total) by mouth daily after supper. 30 capsule 0  . amoxicillin-clavulanate (AUGMENTIN) 875-125 MG tablet Take 1 tablet by mouth daily at 12 noon.    [name] buPROPion (WELLBUTRIN XL) 300 MG 24 hr tablet Take 1 tablet (300 mg total) daily with breakfast by mouth. (Patient taking differently: Take 300 mg by mouth daily at 6 (six) AM. ) 90 tablet 3  . ENBREL SURECLICK 50 MG/ML injection      No facility-administered medications prior to visit.     ROS Review of Systems  Constitutional: Positive for unexpected weight change. Negative for appetite change and fatigue.  HENT: Negative for congestion, nosebleeds, sneezing, sore throat and trouble swallowing.   Eyes: Negative for itching and visual disturbance.  Respiratory: Negative for cough.   Cardiovascular: Negative for chest pain, palpitations and leg swelling.  Gastrointestinal: Negative for abdominal distention, blood in stool, diarrhea and nausea.  Genitourinary: Negative for frequency and hematuria.  Musculoskeletal: Positive for arthralgias and gait problem. Negative for back pain, joint swelling and neck pain.  Skin: Negative for rash.  Neurological: Negative for dizziness, tremors, speech difficulty and weakness.  Psychiatric/Behavioral: Negative for agitation, dysphoric mood and sleep disturbance. The patient is not nervous/anxious.     Objective:  BP  122/80 (BP Location: Right Arm, Patient Position: Sitting, Cuff Size: Large)   Pulse 91   Temp 98 F (36.7 C) (Oral)   Ht 6\' 4"  (1.93 m)   Wt 299 lb (135.6 kg)   SpO2 95%   BMI 36.40 kg/m   BP Readings from Last 3 Encounters:  11/22/17 122/80  11/07/17 (!) 150/91  10/25/17 139/72    Wt Readings from Last 3 Encounters:  11/22/17 299 lb (135.6 kg)  11/07/17 295 lb (133.8 kg)  10/24/17 280 lb (127 kg)    Physical Exam  Constitutional: He is oriented to person, place, and time. He appears well-developed. No distress.  NAD  HENT:  Mouth/Throat: Oropharynx is clear and moist.  Eyes: Conjunctivae are normal. Pupils are equal, round, and reactive to light.  Neck:  Normal range of motion. No JVD present. No thyromegaly present.  Cardiovascular: Normal rate, regular rhythm, normal heart sounds and intact distal pulses. Exam reveals no gallop and no friction rub.  No murmur heard. Pulmonary/Chest: Effort normal and breath sounds normal. No respiratory distress. He has no wheezes. He has no rales. He exhibits no tenderness.  Abdominal: Soft. Bowel sounds are normal. He exhibits no distension and no mass. There is no tenderness. There is no rebound and no guarding.  Musculoskeletal: Normal range of motion. He exhibits tenderness. He exhibits no edema.  Lymphadenopathy:    He has no cervical adenopathy.  Neurological: He is alert and oriented to person, place, and time. He has normal reflexes. No cranial nerve deficit. He exhibits normal muscle tone. He displays a negative Romberg sign. Coordination abnormal. Gait normal.  Skin: Skin is warm and dry. No rash noted.  Psychiatric: He has a normal mood and affect. His behavior is normal. Judgment and thought content normal.   Obese Cane LS tender  Lab Results  Component Value Date   WBC 9.8 10/25/2017   HGB 13.8 10/25/2017   HCT 42.7 10/25/2017   PLT 166 10/25/2017   GLUCOSE 130 (H) 10/25/2017   CHOL 116 03/23/2016   TRIG 87  03/23/2016   HDL 40 03/23/2016   LDLCALC 59 03/23/2016   ALT 23 08/16/2017   AST 20 08/16/2017   NA 136 10/25/2017   K 4.5 10/25/2017   CL 101 10/25/2017   CREATININE 1.22 10/25/2017   BUN 19 10/25/2017   CO2 29 10/25/2017   TSH 1.71 08/16/2017   PSA 1.85 08/16/2017   INR 1.01 10/24/2017   HGBA1C 5.8 (H) 02/06/2013    Dg C-arm 1-60 Min-no Report  Result Date: 11/07/2017 Fluoroscopy was utilized by the requesting physician.  No radiographic interpretation.    Assessment & Plan:   There are no diagnoses linked to this encounter. I have discontinued Orlen Leedy. Vercher's tamsulosin. I am also having him maintain his loratadine, polyethylene glycol, nitroGLYCERIN, multivitamin with minerals, etanercept, denosumab, EPINEPHrine, vitamin C, Calcium Citrate-Vitamin D (CALCIUM CITRATE + D PO), Probiotic Product (PROBIOTIC PO), amoxicillin, atorvastatin, buPROPion, diltiazem, doxycycline, DULoxetine, levothyroxine, levothyroxine, omeprazole, clonazePAM, HYDROcodone-acetaminophen, tiZANidine, Suvorexant, testosterone cypionate, amoxicillin-clavulanate, rivaroxaban, Polyvinyl Alcohol-Povidone PF, Vitamin D-3, ferrous sulfate, mirabegron ER, oxyCODONE-acetaminophen, and ENBREL SURECLICK.  No orders of the defined types were placed in this encounter.    Follow-up: No Follow-up on file.  Walker Kehr, MD

## 2017-11-22 NOTE — Assessment & Plan Note (Signed)
New stones were just removed B

## 2017-12-12 ENCOUNTER — Encounter: Payer: Self-pay | Admitting: Internal Medicine

## 2017-12-13 ENCOUNTER — Other Ambulatory Visit: Payer: Self-pay | Admitting: Internal Medicine

## 2017-12-13 MED ORDER — CLONAZEPAM 1 MG PO TABS: ORAL_TABLET | ORAL | 1 refills | Status: DC

## 2017-12-15 ENCOUNTER — Encounter: Payer: Self-pay | Admitting: Internal Medicine

## 2017-12-15 ENCOUNTER — Telehealth: Payer: Self-pay | Admitting: Internal Medicine

## 2017-12-15 NOTE — Telephone Encounter (Signed)
Copied from [place] [phone]. Topic: Quick Communication - Rx Refill/Question >> Dec 15, 2017  9:24 AM Antonieta Iba C wrote: Medication:  testosterone cypionate (DEPOTESTOSTERONE CYPIONATE) 200 MG/ML injection    Has the patient contacted their pharmacy? Yes -- pt is having this medication ONLY filled via a different mail order because his current will not fill per providers request. He said that he will also send a my chart message to provider for confirmation. He said that [facility] is requesting a ICD-10 code.    (Agent: If no, request that the patient contact the pharmacy for the refill.)   Preferred Pharmacy (with phone number or street name): [facility]   Telephone: [phone] OPT 3 Fax: [phone]    Agent: Please be advised that RX refills may take up to 3 business days. We ask that you follow-up with your pharmacy.

## 2017-12-15 NOTE — Telephone Encounter (Signed)
Called [facility] and gave them ICD-10 code

## 2017-12-20 ENCOUNTER — Telehealth: Payer: Self-pay | Admitting: Internal Medicine

## 2017-12-20 NOTE — Telephone Encounter (Signed)
Copied from [place] [phone]. Topic: Quick Communication - See Telephone Encounter >> Dec 20, 2017  4:05 PM Aurelio Brash B wrote: CRM for notification. See Telephone encounter for:   Pt husband states the pt needs approval from Dr Alain Marion to fill the pts HYDROcodone-acetaminophen [facility]) 7.5-325 MG tablet  [place], [place] [phone] (Phone) [phone] (Fax)     12/20/17.

## 2017-12-21 NOTE — Telephone Encounter (Signed)
Called pharmacy and clarified why patient is taking medication and what they have tried and failed

## 2017-12-23 ENCOUNTER — Telehealth: Payer: Self-pay | Admitting: Internal Medicine

## 2017-12-23 NOTE — Telephone Encounter (Signed)
MD is out of the office will hold until he return on Monday.Marland KitchenJohny Lane

## 2017-12-23 NOTE — Telephone Encounter (Signed)
Copied from [place] [id number]. Topic: Quick Communication - Rx Refill/Question >> Dec 23, 2017  2:05 PM Margot Ables wrote: Medication: pt is requesting change to "testosterone enan mdv 200mg /ml 83ml bottle, inject intramuscularly 17ml every 14 days, discard vial after 28 days of initial use", request 1 month at a time, requesting refills, pt states this should be cheaper for him thru new pharmacy (aware it is too soon right now for ins to cover but the pharmacy will hold the RX) Has the patient contacted their pharmacy? Yes.   Preferred Pharmacy (with phone number or street name): [facility], [place] - [address] [phone] (Phone) [phone] (Fax)

## 2017-12-25 NOTE — Telephone Encounter (Signed)
Okay with me.  Thank you °

## 2017-12-26 ENCOUNTER — Encounter: Payer: Self-pay | Admitting: Cardiovascular Disease

## 2017-12-26 ENCOUNTER — Ambulatory Visit: Payer: Medicare Other | Admitting: Cardiovascular Disease

## 2017-12-26 VITALS — BP 122/64 | HR 95 | Ht 76.0 in | Wt 306.4 lb

## 2017-12-26 DIAGNOSIS — I119 Hypertensive heart disease without heart failure: Secondary | ICD-10-CM | POA: Diagnosis not present

## 2017-12-26 DIAGNOSIS — I2510 Atherosclerotic heart disease of native coronary artery without angina pectoris: Secondary | ICD-10-CM | POA: Diagnosis not present

## 2017-12-26 DIAGNOSIS — E7800 Pure hypercholesterolemia, unspecified: Secondary | ICD-10-CM | POA: Diagnosis not present

## 2017-12-26 DIAGNOSIS — Z5181 Encounter for therapeutic drug level monitoring: Secondary | ICD-10-CM

## 2017-12-26 DIAGNOSIS — I481 Persistent atrial fibrillation: Secondary | ICD-10-CM

## 2017-12-26 DIAGNOSIS — I4819 Other persistent atrial fibrillation: Secondary | ICD-10-CM

## 2017-12-26 MED ORDER — TESTOSTERONE ENANTHATE 200 MG/ML IM SOLN: 100.0000 mg | INTRAMUSCULAR | 0 refills | Status: DC

## 2017-12-26 NOTE — Progress Notes (Signed)
Cardiology Office Note   Date:  12/26/2017   ID:  Mackie, Holness [birth year], MRN [id number]  PCP:  Cassandria Anger, MD  Cardiologist:   Skeet Latch, MD   Chief Complaint  Patient presents with  . Follow-up    History of Present Illness: [name] is a 72 y.o. male with CAD, long-standing-persistent atrial fibrillation, hypertension, hyperlipidemia, sleep apnea on CPAP, hypothyroidism and prior tobacco abuse here for follow-up.  He was initially seen 05/2017 to establish care.  Mr. Powe was previously a patient of [name] and last saw him 01/2016.  At that time he was preparing for left hip surgery.  [name] recommended stress testing but he declined.  He underwent surgery with no complications.  He was initially diagnosed with atrial fibrillation around 2012.  He was started on diltiazem and amiodarone.  At his last appointment it was noted that he had been persistently in atrial fibrillation.  Therefore amiodarone was discontinued. At the time of his atrial fibrillation diagnosis he also underwent a heart catheterization. He denies any chest pain at the time. He reportedly had 90% obstruction of an unknown vessel that had developed collaterals. He was started on Ranexa.  He had an echo 11/03/16 revealed LVEF of 60-65%. Severe left and right atrial enlargement was noted and the IVC was dilated.  At his last appointment he had no angina and therefore Ranexa was discontinued.  Consideration was made for starting a beta-blocker instead of a calcium channel blocker.  However we decided not to make too many changes at one time.  Mr. Fugitt has been doing well since his last appointment.  He has had several surgeries.  He had 4 lithotripsies for nephrolithiasis.  He also had to have surgery on his patella 07/2017.  He has been recovering well.  He does note some exertional dyspnea with walking long distances.  This is been relatively stable and is starting to  improve.  He tries to go to the [facility] and walk on the track.  He can walk for 2/10 of a mile and then goes to the pool for exercises.  He has no chest pain or pressure with these activities.  He also denies lightheadedness or dizziness.  He has not experienced any lower extremity edema, orthopnea, or PND.  Last weekend he went on vacation with several fraternity brothers.  They ate out and had more EtOH than usual.  He thinks that this is why his BP is elevated.     Past Medical History:  Diagnosis Date  . A-fib (HCC)    hx.- sinus rhythm now  . Acid reflux   . AKI (acute kidney injury) (HCC)    mild  . Anxiety   . Arthritis   . Atrial dilatation, bilateral    Severly, enlargement  per ECHO 11/03/16  . BPH (benign prostatic hyperplasia)   . CAD (coronary artery disease)   . Depression   . Dislocation of internal right hip prosthesis, sequela   . Dysrhythmia    hx a fib  . Elevated brain natriuretic peptide (BNP) level   . Environmental allergies   . Fracture of wrist 10/23/13   LEFT  . High cholesterol   . History of Graves' disease   . History of kidney stones    surg. removal 02/2017  . History of syncope   . HLD (hyperlipidemia)   . Hypertension   . Hypothyroidism    thyroid removed  . Left knee  injury    cap dislocated  . Low testosterone, possible hypogonadism 10/01/2012  . Lumbago   . Metabolic bone disease   . Multiple falls   . Osteoporosis    Severe  . Plaque psoriasis   . Sleep apnea    cpap machine-settings 17  . Thrombocytopenia ([place])   . Thyroid disease    HX GRAVES DISEASE  . Transfusion history    s/p 12'13 hip surgery  . Unsteady gait   . UTI (urinary tract infection)   . Vertebral compression fracture (HCC)    L1-wears brace    Past Surgical History:  Procedure Laterality Date  . APPENDECTOMY    . CARDIAC CATHETERIZATION  2013  . CATARACT EXTRACTION, BILATERAL    . COLONOSCOPY    . CYSTOSCOPY WITH RETROGRADE PYELOGRAM, URETEROSCOPY AND STENT  PLACEMENT Left 11/07/2017   Procedure: CYSTOSCOPY WITH RETROGRADE PYELOGRAM, URETEROSCOPY AND STENT PLACEMENT;  Surgeon: Cleon Gustin, MD;  Location: [facility];  Service: Urology;  Laterality: Left;  . CYSTOSCOPY/URETEROSCOPY/HOLMIUM LASER/STENT PLACEMENT Right 03/11/2017   Procedure: CYSTOSCOPY/URETEROSCOPYSTENT PLACEMENT right ureter retrograde pylegram;  Surgeon: Cleon Gustin, MD;  Location: [facility];  Service: Urology;  Laterality: Right;  . HARDWARE REMOVAL  10/05/2012   Procedure: HARDWARE REMOVAL;  Surgeon: [name], MD;  Location: [facility];  Service: Orthopedics;  Laterality: Right;  REMOVING  STRYKER  GAMMA NAIL  . HARDWARE REMOVAL Right 07/03/2013   Procedure: HARDWARE REMOVAL RIGHT TIBIA ;  Surgeon: Rozanna Box, MD;  Location: [place];  Service: Orthopedics;  Laterality: Right;  . HIP CLOSED REDUCTION Right 10/15/2013   Procedure: CLOSED MANIPULATION HIP;  Surgeon: [name], MD;  [facility]: [facility];  Service: Orthopedics;  Laterality: Right;  . hip revision     June 2017  . HOLMIUM LASER APPLICATION Right 04/12/7105   Procedure: HOLMIUM LASER APPLICATION;  Surgeon: Cleon Gustin, MD;  [facility];  Service: Urology;  Laterality: Right;  . INCISION AND DRAINAGE HIP Right 11/16/2013   Procedure: IRRIGATION AND DEBRIDEMENT RIGHT HIP;  Surgeon: [name], MD;  [facility];  Service: Orthopedics;  Laterality: Right;  . IR URETERAL STENT LEFT NEW ACCESS W/O SEP NEPHROSTOMY CATH  10/24/2017  . JOINT REPLACEMENT     hip-right x2  . KNEE SURGERY Bilateral 2012   Total knee replacements  . NEPHROLITHOTOMY Right 02/08/2017   Procedure: NEPHROLITHOTOMY PERCUTANEOUS WITH SURGEON ACCESS;  Surgeon: Cleon Gustin, MD;  [facility];  Service: Urology;  Laterality: Right;  . NEPHROLITHOTOMY Left 10/24/2017   Procedure: NEPHROLITHOTOMY PERCUTANEOUS;  Surgeon: Cleon Gustin, MD;  [facility];  Service: Urology;  Laterality: Left;  . ORIF TIBIA FRACTURE  Right 02/06/2013   Procedure: OPEN REDUCTION INTERNAL FIXATION (ORIF) TIBIA FRACTURE WITH IM ROD FIBULA;  Surgeon: Rozanna Box, MD;  Location: [place];  Service: Orthopedics;  Laterality: Right;  . ORIF TIBIA FRACTURE Right 07/03/2013   Procedure: RIGHT TIBIA NON UNION REPAIR ;  Surgeon: Rozanna Box, MD;  Location: [place];  Service: Orthopedics;  Laterality: Right;  . ORIF WRIST FRACTURE  10/02/2012   Procedure: OPEN REDUCTION INTERNAL FIXATION (ORIF) WRIST FRACTURE;  Surgeon: Roseanne Kaufman, MD;  [facility];  Service: Orthopedics;  Laterality: Right;  WITH   ANTIBIOTIC  CEMENT  . ORIF WRIST FRACTURE Left 10/28/2013   Procedure: OPEN REDUCTION INTERNAL FIXATION (ORIF) WRIST FRACTURE with allograft;  Surgeon: Roseanne Kaufman, MD;  [facility];  Service: Orthopedics;  Laterality:  Left;  DVR Plate  . QUADRICEPS TENDON REPAIR Left 07/15/2017   Procedure: REPAIR QUADRICEP TENDON;  Surgeon: Frederik Pear, MD;  Location: [place];  Service: Orthopedics;  Laterality: Left;  . right femur surgery  05/2012  . THYROIDECTOMY  02/1986  . TOTAL HIP REVISION  10/05/2012   Procedure: TOTAL HIP REVISION;  Surgeon: [name], MD;  [facility];  Service: Orthopedics;  Laterality: Right;  RIGHT TOTAL HIP REVISION  . TOTAL HIP REVISION Right 09/17/2013   Procedure: REVISION RIGHT TOTAL HIP ARTHROPLASTY ;  Surgeon: [name], MD;  [facility];  Service: Orthopedics;  Laterality: Right;  . TOTAL HIP REVISION Right 10/26/2013   Procedure: REVISION RIGHT TOTAL HIP ARTHROPLASTY;  Surgeon: [name], MD;  [facility];  Service: Orthopedics;  Laterality: Right;  . TOTAL HIP REVISION  03/2016  . TOTAL KNEE REVISION Left 04/11/2017   Procedure: TOTAL KNEE REVISION PATELLA and TIBIA;  Surgeon: Frederik Pear, MD;  Location: [place];  Service: Orthopedics;  Laterality: Left;  . WRIST FRACTURE SURGERY  05/2012     Current Outpatient Medications  Medication Sig Dispense Refill  . amoxicillin  (AMOXIL) 500 MG capsule Take 2,000 mg by mouth See admin instructions. Takes 4 capsules 1 hour prior to dental procedures    . amoxicillin-clavulanate (AUGMENTIN) 875-125 MG tablet Take 1 tablet by mouth daily at 12 noon.    [name] atorvastatin (LIPITOR) 80 MG tablet Take 1 tablet (80 mg total) 2 (two) times a week by mouth. Monday & Friday evenings ONLY 90 tablet 3  . buPROPion (WELLBUTRIN XL) 300 MG 24 hr tablet Take 1 tablet (300 mg total) daily with breakfast by mouth. (Patient taking differently: Take 300 mg by mouth daily at 6 (six) AM. ) 90 tablet 3  . Calcium Citrate-Vitamin D (CALCIUM CITRATE + D PO) Take 1 tablet by mouth 2 (two) times daily.     . Cholecalciferol (VITAMIN D-3) 5000 units TABS Take 5,000 Units by mouth daily.    . clonazePAM (KLONOPIN) 1 MG tablet Take one tablet by mouth every 6 hours 360 tablet 1  . denosumab (PROLIA) 60 MG/ML SOLN injection Inject 60 mg into the skin every 6 (six) months. Administer in upper arm, thigh, or abdomen    . diltiazem (CARDIZEM CD) 240 MG 24 hr capsule Take 1 capsule (240 mg total) daily by mouth. (Patient taking differently: Take 240 mg by mouth daily with breakfast. (0800-0830)) 90 capsule 3  . doxycycline (VIBRA-TABS) 100 MG tablet Take 1 tablet (100 mg total) daily by mouth. 90 tablet 3  . DULoxetine (CYMBALTA) 60 MG capsule Take 1 capsule (60 mg total) 2 (two) times daily by mouth. 180 capsule 3  . EPINEPHrine (EPIPEN 2-PAK) 0.3 mg/0.3 mL IJ SOAJ injection Inject 0.3 mg into the muscle daily as needed (for anaphylactic reaction).    [name] etanercept (ENBREL) 50 MG/ML injection Inject 50 mg into the skin 2 (two) times a week. Tuesday & Friday    . ferrous sulfate 325 (65 FE) MG tablet Take 325 mg by mouth 2 (two) times daily with a meal.    . HYDROcodone-acetaminophen (NORCO) 7.5-325 MG tablet Take 1 tablet by mouth every 6 (six) hours as needed for severe pain. It is a 3 months supply 120 tablet 0  . levothyroxine (SYNTHROID, LEVOTHROID) 200 MCG  tablet Take 1 tablet (200 mcg total) daily at 6 (six) AM by mouth. (0500) (Patient taking differently: Take 200 mcg by mouth daily  at 6 (six) AM. (0500) on empty stomach Takes a 200 mcg and  25 mcg tablet daily to equal 225 mcg) 90 tablet 3  . levothyroxine (SYNTHROID, LEVOTHROID) 25 MCG tablet Take 1 tablet (25 mcg total) daily before breakfast by mouth. (Patient taking differently: Take 25 mcg by mouth daily at 6 (six) AM. (0500) on empty stomach Takes a 200 mcg and  25 mcg tablet daily to equal 225 mcg) 90 tablet 3  . loratadine (CLARITIN) 10 MG tablet Take 10 mg by mouth daily.     . mirabegron ER (MYRBETRIQ) 25 MG TB24 tablet Take 25 mg by mouth at bedtime.    . Multiple Vitamin (MULTIVITAMIN WITH MINERALS) TABS tablet Take 1 tablet by mouth daily. Men's One-A-Day 90+    . nitroGLYCERIN (NITROSTAT) 0.4 MG SL tablet Place 0.4 mg under the tongue every 5 (five) minutes as needed for chest pain. x3 doses as needed for chest pain    . omeprazole (PRILOSEC) 40 MG capsule Take 1 capsule (40 mg total) daily before breakfast by mouth. (Patient taking differently: Take 40 mg by mouth daily with breakfast. ) 90 capsule 3  . polyethylene glycol (MIRALAX / GLYCOLAX) packet Take 17 g by mouth daily as needed for mild constipation.     . Polyvinyl Alcohol-Povidone PF (REFRESH) 1.4-0.6 % SOLN Place 1 drop into both eyes daily as needed (for dry eyes.).     [name] Probiotic Product (PROBIOTIC PO) Take 1 capsule by mouth daily with breakfast.    . rivaroxaban (XARELTO) 20 MG TABS tablet Take 20 mg by mouth daily with breakfast.    . Suvorexant (BELSOMRA) 15 MG TABS Take 15 mg by mouth at bedtime as needed. (Patient taking differently: Take 15 mg by mouth at bedtime as needed (for sleep.). ) 90 tablet 1  . testosterone enanthate (DELATESTRYL) 200 MG/ML injection Inject 0.5 mLs (100 mg total) into the muscle every 14 (fourteen) days. For IM use only- discard vial after 28 days 15 mL 0  . tiZANidine (ZANAFLEX) 4 MG  tablet Take 1 tablet (4 mg total) by mouth every 6 (six) hours as needed for muscle spasms. 360 tablet 1  . vitamin C (ASCORBIC ACID) 500 MG tablet Take 500 mg by mouth daily.     No current facility-administered medications for this visit.     Allergies:   Short ragweed pollen ext    Social History:  The patient  reports that he quit smoking about 5 years ago. he has never used smokeless tobacco. He reports that he drinks alcohol. He reports that he does not use drugs.   Family History:  The patient's family history includes Alcohol abuse in his father and sister; Arthritis in his mother; Asthma in his father; CAD (age of onset: 67) in his father.    ROS:  Please see the history of present illness.   Otherwise, review of systems are positive for none.   All other systems are reviewed and negative.    PHYSICAL EXAM: VS:  BP 122/64   Pulse 95   Ht 6\' 4"  (1.93 m)   Wt (!) 306 lb 6.4 oz (139 kg)   BMI 37.30 kg/m  , BMI Body mass index is 37.3 kg/m. GENERAL:  Well appearing HEENT: Pupils equal round and reactive, fundi not visualized, oral mucosa unremarkable NECK:  No jugular venous distention, waveform within normal limits, carotid upstroke brisk and symmetric, no bruits, no thyromegaly LYMPHATICS:  No cervical adenopathy LUNGS:  Clear to auscultation  bilaterally HEART:  Irregularly irregular.  PMI not displaced or sustained,S1 and S2 within normal limits, no S3, no S4, no clicks, no rubs, no murmurs ABD:  Flat, positive bowel sounds normal in frequency in pitch, no bruits, no rebound, no guarding, no midline pulsatile mass, no hepatomegaly, no splenomegaly EXT:  2 plus pulses throughout, no edema, no cyanosis no clubbing SKIN:  No rashes no nodules NEURO:  Cranial nerves II through XII grossly intact, motor grossly intact throughout PSYCH:  Cognitively intact, oriented to person place and time   EKG:  EKG is ordered today. The ekg ordered today demonstrates Atrial fibrillation.  Rate 71 bpm. Left axis deviation. Nonspecific IVCD. 12/26/17: Atrial fibrillation.  Rate 95 bpm.  Cannot rule out prior anterior infarct.   Echo 11/03/16: Study Conclusions  - Left ventricle: Systolic function was normal. The estimated   ejection fraction was in the range of 60% to 65%. The study is   not technically sufficient to allow evaluation of LV diastolic   function. - Left atrium: Severely dilated. - Right atrium: Severely dilated.  Impressions:  - Technically difficult study with poor echo windows. The LVEF is   grossly normal at 60-65%, there is severe biatrial enlargment,   the IVC is dilated.  Recent Labs: 08/16/2017: ALT 23; TSH 1.71 10/25/2017: BUN 19; Creatinine, Ser 1.22; Hemoglobin 13.8; Platelets 166; Potassium 4.5; Sodium 136    Lipid Panel    Component Value Date/Time   CHOL 116 03/23/2016   TRIG 87 03/23/2016   HDL 40 03/23/2016   CHOLHDL 1.8 07/06/2013 1025   VLDL 10 07/06/2013 1025   LDLCALC 59 03/23/2016      Wt Readings from Last 3 Encounters:  12/26/17 (!) 306 lb 6.4 oz (139 kg)  11/22/17 299 lb (135.6 kg)  11/07/17 295 lb (133.8 kg)      ASSESSMENT AND PLAN:  # Longstanding persistent atrial fibrillation: Mr. Millhouse remains in atrial fibrillation.  He remains asymptomatic after stopping amiodarone.  Continue diltiazem and Xarelto.   # CAD: # Hyperlipidemia: Mr. Gibbon reportedly has obstructive coronary artery disease with evidence of collateralization. He has never experienced any chest pain and this catheterization was reportedly performed for atrial fibrillation.  Ranexa was discontinued and he remains chest pain free.   Check lipids and CMP.  Continue atorvastatin.  # Hypertension:  Blood pressure was initially elevated. However on repeat it was 122/64. Continue diltiazem.  Limit salt and increase exercise.    # Morbid obesity:  Encouraged him to keep up the exercise.   # OSA: Continue CPAP   Current medicines are reviewed at  length with the patient today.  The patient does not have concerns regarding medicines.  The following changes have been made:   Labs/ tests ordered today include:   Orders Placed This Encounter  Procedures  . Lipid panel  . Comprehensive metabolic panel  . EKG 12-Lead     Disposition:   FU with Jaydalynn Olivero C. Oval Linsey, MD, [facility] in 6 months.    This note was written with the assistance of speech recognition software.  Please excuse any transcriptional errors.  Signed, Gurtej Noyola C. Oval Linsey, MD, [facility]  12/26/2017 10:33 AM    [place]

## 2017-12-26 NOTE — Patient Instructions (Addendum)
Medication Instructions:  Your physician recommends that you continue on your current medications as directed. Please refer to the Current Medication list given to you today.  Labwork: FASTING LP/CMET SOON   Testing/Procedures: NONE  Follow-Up: Your physician wants you to follow-up in: 6 MONTH    If you need a refill on your cardiac medications before your next appointment, please call your pharmacy.

## 2017-12-26 NOTE — Telephone Encounter (Signed)
Notified pt MD ok to change testosterone. New script has been faxed to [facility] speciality mail service.Marland KitchenJohny Lane

## 2017-12-30 ENCOUNTER — Telehealth: Payer: Self-pay | Admitting: Cardiovascular Disease

## 2017-12-30 LAB — COMPREHENSIVE METABOLIC PANEL
ALBUMIN: 3.9 g/dL (ref 3.5–4.8)
ALT: 36 IU/L (ref 0–44)
AST: 30 IU/L (ref 0–40)
Albumin/Globulin Ratio: 1.3 (ref 1.2–2.2)
Alkaline Phosphatase: 63 IU/L (ref 39–117)
BUN / CREAT RATIO: 18 (ref 10–24)
BUN: 19 mg/dL (ref 8–27)
Bilirubin Total: 0.6 mg/dL (ref 0.0–1.2)
CALCIUM: 8.9 mg/dL (ref 8.6–10.2)
CO2: 24 mmol/L (ref 20–29)
Chloride: 100 mmol/L (ref 96–106)
Creatinine, Ser: 1.06 mg/dL (ref 0.76–1.27)
GFR calc non Af Amer: 70 mL/min/{1.73_m2} (ref >59)
GFR, EST AFRICAN AMERICAN: 81 mL/min/{1.73_m2} (ref >59)
GLUCOSE: 102 mg/dL — AB (ref 65–99)
Globulin, Total: 3.1 g/dL (ref 1.5–4.5)
Potassium: 4.5 mmol/L (ref 3.5–5.2)
Sodium: 140 mmol/L (ref 134–144)
TOTAL PROTEIN: 7 g/dL (ref 6.0–8.5)

## 2017-12-30 LAB — LIPID PANEL
Chol/HDL Ratio: 3 ratio (ref 0.0–5.0)
Cholesterol, Total: 163 mg/dL (ref 100–199)
HDL: 55 mg/dL (ref >39)
LDL Calculated: 95 mg/dL (ref 0–99)
Triglycerides: 63 mg/dL (ref 0–149)
VLDL Cholesterol Cal: 13 mg/dL (ref 5–40)

## 2017-12-30 NOTE — Telephone Encounter (Signed)
Follow Up: ° ° °Returning your call,concerning his lab results. °

## 2018-01-06 ENCOUNTER — Encounter: Payer: Self-pay | Admitting: Internal Medicine

## 2018-01-09 MED ORDER — TESTOSTERONE ENANTHATE 200 MG/ML IM SOLN: 200.0000 mg | INTRAMUSCULAR | 3 refills | Status: DC

## 2018-01-10 ENCOUNTER — Telehealth: Payer: Self-pay | Admitting: Cardiovascular Disease

## 2018-01-10 NOTE — Telephone Encounter (Signed)
New Message ° ° °Patient is returning call in reference to labs. Please call to discuss.  °

## 2018-01-10 NOTE — Telephone Encounter (Signed)
-----   Message from Minus Breeding, MD sent at 12/30/2017  2:53 PM EDT ----- LDL is not at target.  I would suggest Crestor 40 mg daily.  If he agrees to change then prescribe 40 mg po daily Disp number 90 with 3 refills.  Repeat lipid and liver profiles in 8 weeks.  Call [name] with the results and send results to [facility], Evie Lacks, MD

## 2018-01-10 NOTE — Telephone Encounter (Addendum)
Spoke with patient and he is only taking the Atorvastatin 80 mg 2 days a week, was decreased secondary to joint pain. Per patient he continues to have joint pain. He is willing to increase the Atorvastatin Will forward to Dr Oval Linsey to for further review. No answer will try again

## 2018-01-17 ENCOUNTER — Telehealth: Payer: Self-pay | Admitting: Internal Medicine

## 2018-01-17 ENCOUNTER — Encounter: Payer: Self-pay | Admitting: Cardiovascular Disease

## 2018-01-17 NOTE — Telephone Encounter (Signed)
Discussed with Dr Oval Linsey and will have patient increase Atorvastatin to 80 mg daily except none on Tuesday or Thursday. Released in mychart and will call to follow up

## 2018-01-17 NOTE — Telephone Encounter (Signed)
Error

## 2018-01-18 ENCOUNTER — Encounter: Payer: Self-pay | Admitting: Cardiovascular Disease

## 2018-01-23 MED ORDER — ATORVASTATIN CALCIUM 80 MG PO TABS: ORAL_TABLET | ORAL | 3 refills | Status: DC

## 2018-01-23 NOTE — Telephone Encounter (Signed)
Received my chart message stating received message and requesting refill. Refill sent to [facility] as requested

## 2018-01-25 ENCOUNTER — Encounter: Payer: Self-pay | Admitting: Internal Medicine

## 2018-01-27 ENCOUNTER — Encounter: Payer: Self-pay | Admitting: Internal Medicine

## 2018-01-27 ENCOUNTER — Telehealth: Payer: Self-pay | Admitting: Internal Medicine

## 2018-01-27 NOTE — Telephone Encounter (Signed)
Copied from [place] [id number]. Topic: Quick Communication - Rx Refill/Question >> Jan 27, 2018 12:44 PM Wynetta Emery, [place] C wrote: Medication: doxycycline (VIBRA-TABS) 100 MG tablet --- 90 day supply   Has the patient contacted their pharmacy? No   (Agent: If no, request that the patient contact the pharmacy for the refill.)  Preferred Pharmacy (with phone number or street name): [place], [place]  Agent: Please be advised that RX refills may take up to 3 business days. We ask that you follow-up with your pharmacy.

## 2018-01-27 NOTE — Telephone Encounter (Signed)
[facility] pharmacy called and spoke to [employer], [employer]chant [employer] officer. She verified receipt of Doxycycline on 08/16/17 and shipping the 90 day supply, but says it was discontinued in November by Anderson Malta, CMA and they have not received another prescription.  Patient needs 90 day supply sent  Last OV:11/22/17 PCP: [facility] Pharmacy: [place], [place] [phone] (Phone) [phone] (Fax)

## 2018-01-29 MED ORDER — TIZANIDINE HCL 4 MG PO TABS: 4.0000 mg | ORAL_TABLET | Freq: Four times a day (QID) | ORAL | 1 refills | Status: DC | PRN

## 2018-01-30 MED ORDER — DOXYCYCLINE HYCLATE 100 MG PO TABS: 100.0000 mg | ORAL_TABLET | Freq: Every day | ORAL | 3 refills | Status: DC

## 2018-02-03 ENCOUNTER — Telehealth: Payer: Self-pay | Admitting: Cardiovascular Disease

## 2018-02-03 NOTE — Telephone Encounter (Signed)
Pt calling   Returning call from nurse. Please call pt

## 2018-02-03 NOTE — Telephone Encounter (Signed)
Patient stated that he is currently taking Atorvastatin 80 mg five days a week (none on Tuesday and Thursdays). He is currently not experiencing any joint pain and said he could increase it to daily if needed. Message routed to physician for her recommendations.  Notes recorded by Skeet Latch, MD on 02/02/2018 at 11:10 AM EDT He doesn't need to increase atorvastatin if he is already having pain. Recommend that he be seen in lipid clinic to decide on adding zetia or trying a PCSK9 inhibitor.

## 2018-02-05 NOTE — Telephone Encounter (Signed)
  OK increase to daily and let's see how he does.

## 2018-02-06 NOTE — Telephone Encounter (Signed)
Copied from [place] [id number]. Topic: General - Other >> Feb 06, 2018 12:21 PM Yvette Rack wrote: Reason for CRM: patient would like a paper RX on Doxycycline three month supply because he is having problems with [facility] because is on two antibiotics and they have questions so her would just like it printed out so he can take it to another pharmacy

## 2018-02-07 ENCOUNTER — Telehealth: Payer: Self-pay | Admitting: *Deleted

## 2018-02-07 MED ORDER — DOXYCYCLINE HYCLATE 100 MG PO TABS: 100.0000 mg | ORAL_TABLET | Freq: Every day | ORAL | 0 refills | Status: DC

## 2018-02-07 NOTE — Telephone Encounter (Signed)
Advised patient, will call back if any issues.    Skeet Latch, MD at 02/05/2018 8:58 PM   Status: Signed     OK increase to daily and let's see how he does.    Ricci Barker, RN at 02/03/2018 9:44 AM   Status: Signed    Patient stated that he is currently taking Atorvastatin 80 mg five days a week (none on Tuesday and Thursdays). He is currently not experiencing any joint pain and said he could increase it to daily if needed. Message routed to physician for her recommendations.  Notes recorded by Skeet Latch, MD on 02/02/2018 at 11:10 AM EDT He doesn't need to increase atorvastatin if he is already having pain. Recommend that he be seen in lipid clinic to decide on adding zetia or trying a PCSK9 inhibitor.    Pauline Good at 02/03/2018 9:31 AM   Status: Signed    Pt calling   Returning call from nurse. Please call pt

## 2018-02-07 NOTE — Telephone Encounter (Signed)
Notified pt MD ok rx will leave upfront for pick-up../lm,b

## 2018-02-07 NOTE — Telephone Encounter (Signed)
Ok Thx 

## 2018-02-15 ENCOUNTER — Other Ambulatory Visit: Payer: Self-pay | Admitting: Internal Medicine

## 2018-02-15 NOTE — Telephone Encounter (Signed)
Copied from [place] [id number]. Topic: Quick Communication - Rx Refill/Question >> Feb 15, 2018 11:01 AM Cleaster Corin, NT wrote: Medication: HYDROcodone-acetaminophen ([place]) 7.5-325 MG tablet [[id number]]  Has the patient contacted their pharmacy? no (Agent: If no, request that the patient contact the pharmacy for the refill.) Preferred Pharmacy (with phone number or street name): [place], [place] [address] Phone: [phone] Fax: [phone]   Agent: Please be advised that RX refills may take up to 3 business days. We ask that you follow-up with your pharmacy.

## 2018-02-15 NOTE — Telephone Encounter (Signed)
Refill request for Hydrocodone, last filled on 01/20/18 #120  LOV: 11/22/17 Dr. Alain Marion  [facility]

## 2018-02-20 ENCOUNTER — Other Ambulatory Visit: Payer: Self-pay | Admitting: Internal Medicine

## 2018-02-20 ENCOUNTER — Telehealth: Payer: Self-pay | Admitting: Internal Medicine

## 2018-02-20 ENCOUNTER — Encounter: Payer: Self-pay | Admitting: Internal Medicine

## 2018-02-20 ENCOUNTER — Other Ambulatory Visit (INDEPENDENT_AMBULATORY_CARE_PROVIDER_SITE_OTHER): Payer: Medicare Other

## 2018-02-20 ENCOUNTER — Ambulatory Visit: Payer: Medicare Other | Admitting: Internal Medicine

## 2018-02-20 VITALS — BP 124/72 | HR 100 | Temp 98.1°F | Ht 76.0 in | Wt 305.0 lb

## 2018-02-20 DIAGNOSIS — E559 Vitamin D deficiency, unspecified: Secondary | ICD-10-CM

## 2018-02-20 DIAGNOSIS — Z7901 Long term (current) use of anticoagulants: Secondary | ICD-10-CM

## 2018-02-20 DIAGNOSIS — I10 Essential (primary) hypertension: Secondary | ICD-10-CM | POA: Diagnosis not present

## 2018-02-20 DIAGNOSIS — E291 Testicular hypofunction: Secondary | ICD-10-CM | POA: Diagnosis not present

## 2018-02-20 DIAGNOSIS — I481 Persistent atrial fibrillation: Secondary | ICD-10-CM

## 2018-02-20 DIAGNOSIS — I4819 Other persistent atrial fibrillation: Secondary | ICD-10-CM

## 2018-02-20 DIAGNOSIS — I2510 Atherosclerotic heart disease of native coronary artery without angina pectoris: Secondary | ICD-10-CM

## 2018-02-20 DIAGNOSIS — D649 Anemia, unspecified: Secondary | ICD-10-CM | POA: Diagnosis not present

## 2018-02-20 LAB — BASIC METABOLIC PANEL
BUN: 23 mg/dL (ref 6–23)
CHLORIDE: 102 meq/L (ref 96–112)
CO2: 33 mEq/L — ABNORMAL HIGH (ref 19–32)
Calcium: 9.2 mg/dL (ref 8.4–10.5)
Creatinine, Ser: 1.27 mg/dL (ref 0.40–1.50)
GFR: 59.18 mL/min — AB (ref >60.00)
GLUCOSE: 94 mg/dL (ref 70–99)
Potassium: 4.3 mEq/L (ref 3.5–5.1)
SODIUM: 140 meq/L (ref 135–145)

## 2018-02-20 LAB — CBC WITH DIFFERENTIAL/PLATELET
Basophils Absolute: 0 10*3/uL (ref 0.0–0.1)
Basophils Relative: 0.8 % (ref 0.0–3.0)
EOS PCT: 2.5 % (ref 0.0–5.0)
Eosinophils Absolute: 0.1 10*3/uL (ref 0.0–0.7)
HCT: 52.6 % — ABNORMAL HIGH (ref 39.0–52.0)
HEMOGLOBIN: 17.7 g/dL — AB (ref 13.0–17.0)
LYMPHS ABS: 1.9 10*3/uL (ref 0.7–4.0)
Lymphocytes Relative: 34.1 % (ref 12.0–46.0)
MCHC: 33.8 g/dL (ref 30.0–36.0)
MCV: 91.1 fl (ref 78.0–100.0)
MONOS PCT: 9.6 % (ref 3.0–12.0)
Monocytes Absolute: 0.5 10*3/uL (ref 0.1–1.0)
NEUTROS PCT: 53 % (ref 43.0–77.0)
Neutro Abs: 3 10*3/uL (ref 1.4–7.7)
Platelets: 159 10*3/uL (ref 150.0–400.0)
RBC: 5.77 Mil/uL (ref 4.22–5.81)
RDW: 16.4 % — AB (ref 11.5–15.5)
WBC: 5.7 10*3/uL (ref 4.0–10.5)

## 2018-02-20 LAB — PSA: PSA: 1.12 ng/mL (ref 0.10–4.00)

## 2018-02-20 LAB — TESTOSTERONE: Testosterone: 549.1 ng/dL (ref 300.00–890.00)

## 2018-02-20 MED ORDER — HYDROCODONE-ACETAMINOPHEN 7.5-325 MG PO TABS: 1.0000 | ORAL_TABLET | Freq: Four times a day (QID) | ORAL | 0 refills | Status: DC | PRN

## 2018-02-20 MED ORDER — TESTOSTERONE ENANTHATE 200 MG/ML IM SOLN: 200.0000 mg | INTRAMUSCULAR | 5 refills | Status: DC

## 2018-02-20 NOTE — Assessment & Plan Note (Signed)
BP Readings from Last 3 Encounters:  02/20/18 124/72  12/26/17 122/64  11/22/17 122/80

## 2018-02-20 NOTE — Telephone Encounter (Signed)
Ok Done Thx 

## 2018-02-20 NOTE — Assessment & Plan Note (Signed)
Vit D 

## 2018-02-20 NOTE — Telephone Encounter (Signed)
Pt forgot to request a refill on his Hydrocodone 7.5-325 mg tab.  Please send to [facility] per pt's request  (for May, June & July).

## 2018-02-20 NOTE — Assessment & Plan Note (Signed)
Xarelto

## 2018-02-20 NOTE — Progress Notes (Signed)
Subjective:  Patient ID: [name], male    DOB: 17-Jan-1946  Age: 72 y.o. MRN: [id number]  CC: No chief complaint on file.   HPI [name] presents for hypogonadism, dyslipidemia, anxiety f/u  Outpatient Medications Prior to Visit  Medication Sig Dispense Refill  . amoxicillin (AMOXIL) 500 MG capsule Take 2,000 mg by mouth See admin instructions. Takes 4 capsules 1 hour prior to dental procedures    . amoxicillin-clavulanate (AUGMENTIN) 875-125 MG tablet Take 1 tablet by mouth daily at 12 noon.    [name] atorvastatin (LIPITOR) 80 MG tablet Take 80 mg by mouth daily.    [name] buPROPion (WELLBUTRIN XL) 300 MG 24 hr tablet Take 1 tablet (300 mg total) daily with breakfast by mouth. (Patient taking differently: Take 300 mg by mouth daily at 6 (six) AM. ) 90 tablet 3  . Calcium Citrate-Vitamin D (CALCIUM CITRATE + D PO) Take 1 tablet by mouth 2 (two) times daily.     . Cholecalciferol (VITAMIN D-3) 5000 units TABS Take 5,000 Units by mouth daily.    . clonazePAM (KLONOPIN) 1 MG tablet Take one tablet by mouth every 6 hours 360 tablet 1  . denosumab (PROLIA) 60 MG/ML SOLN injection Inject 60 mg into the skin every 6 (six) months. Administer in upper arm, thigh, or abdomen    . diltiazem (CARDIZEM CD) 240 MG 24 hr capsule Take 1 capsule (240 mg total) daily by mouth. (Patient taking differently: Take 240 mg by mouth daily with breakfast. (0800-0830)) 90 capsule 3  . doxycycline (VIBRA-TABS) 100 MG tablet Take 1 tablet (100 mg total) by mouth daily. 90 tablet 0  . EPINEPHrine (EPIPEN 2-PAK) 0.3 mg/0.3 mL IJ SOAJ injection Inject 0.3 mg into the muscle daily as needed (for anaphylactic reaction).    [name] etanercept (ENBREL) 50 MG/ML injection Inject 50 mg into the skin 2 (two) times a week. Tuesday & Friday    . ferrous sulfate 325 (65 FE) MG tablet Take 325 mg by mouth 2 (two) times daily with a meal.    . HYDROcodone-acetaminophen (NORCO) 7.5-325 MG tablet Take 1 tablet by mouth every 6 (six)  hours as needed for severe pain. It is a 3 months supply 120 tablet 0  . levothyroxine (SYNTHROID, LEVOTHROID) 200 MCG tablet Take 1 tablet (200 mcg total) daily at 6 (six) AM by mouth. (0500) (Patient taking differently: Take 200 mcg by mouth daily at 6 (six) AM. (0500) on empty stomach Takes a 200 mcg and  25 mcg tablet daily to equal 225 mcg) 90 tablet 3  . levothyroxine (SYNTHROID, LEVOTHROID) 25 MCG tablet Take 1 tablet (25 mcg total) daily before breakfast by mouth. (Patient taking differently: Take 25 mcg by mouth daily at 6 (six) AM. (0500) on empty stomach Takes a 200 mcg and  25 mcg tablet daily to equal 225 mcg) 90 tablet 3  . loratadine (CLARITIN) 10 MG tablet Take 10 mg by mouth daily.     . mirabegron ER (MYRBETRIQ) 25 MG TB24 tablet Take 25 mg by mouth at bedtime.    . Multiple Vitamin (MULTIVITAMIN WITH MINERALS) TABS tablet Take 1 tablet by mouth daily. Men's One-A-Day 32+    . nitroGLYCERIN (NITROSTAT) 0.4 MG SL tablet Place 0.4 mg under the tongue every 5 (five) minutes as needed for chest pain. x3 doses as needed for chest pain    . omeprazole (PRILOSEC) 40 MG capsule Take 1 capsule (40 mg total) daily before breakfast by mouth. (Patient taking  differently: Take 40 mg by mouth daily with breakfast. ) 90 capsule 3  . Probiotic Product (PROBIOTIC PO) Take 1 capsule by mouth daily with breakfast.    . rivaroxaban (XARELTO) 20 MG TABS tablet Take 20 mg by mouth daily with breakfast.    . Suvorexant (BELSOMRA) 15 MG TABS Take 15 mg by mouth at bedtime as needed. (Patient taking differently: Take 15 mg by mouth at bedtime as needed (for sleep.). ) 90 tablet 1  . tiZANidine (ZANAFLEX) 4 MG tablet Take 1 tablet (4 mg total) by mouth every 6 (six) hours as needed for muscle spasms. 360 tablet 1  . vitamin C (ASCORBIC ACID) 500 MG tablet Take 500 mg by mouth daily.    . DULoxetine (CYMBALTA) 60 MG capsule Take 1 capsule (60 mg total) 2 (two) times daily by mouth. 180 capsule 3  .  polyethylene glycol (MIRALAX / GLYCOLAX) packet Take 17 g by mouth daily as needed for mild constipation.     . Polyvinyl Alcohol-Povidone PF (REFRESH) 1.4-0.6 % SOLN Place 1 drop into both eyes daily as needed (for dry eyes.).     [name] testosterone enanthate (DELATESTRYL) 200 MG/ML injection Inject 1 mL (200 mg total) into the muscle every 14 (fourteen) days. For IM use only- discard vial after 28 days 5 mL 3   No facility-administered medications prior to visit.     ROS Review of Systems  Constitutional: Negative for appetite change, fatigue and unexpected weight change.  HENT: Negative for congestion, nosebleeds, sneezing, sore throat and trouble swallowing.   Eyes: Negative for itching and visual disturbance.  Respiratory: Negative for cough.   Cardiovascular: Negative for chest pain, palpitations and leg swelling.  Gastrointestinal: Negative for abdominal distention, blood in stool, diarrhea and nausea.  Genitourinary: Negative for frequency and hematuria.  Musculoskeletal: Positive for arthralgias and gait problem. Negative for back pain, joint swelling and neck pain.  Skin: Positive for color change. Negative for rash and wound.  Neurological: Negative for dizziness, tremors, speech difficulty and weakness.  Psychiatric/Behavioral: Negative for agitation, dysphoric mood, sleep disturbance and suicidal ideas. The patient is not nervous/anxious.     Objective:  BP 124/72 (BP Location: Right Arm, Patient Position: Sitting, Cuff Size: Large)   Pulse 100   Temp 98.1 F (36.7 C) (Oral)   Ht 6\' 4"  (1.93 m)   Wt (!) 305 lb (138.3 kg)   SpO2 97%   BMI 37.13 kg/m   BP Readings from Last 3 Encounters:  02/20/18 124/72  12/26/17 122/64  11/22/17 122/80    Wt Readings from Last 3 Encounters:  02/20/18 (!) 305 lb (138.3 kg)  12/26/17 (!) 306 lb 6.4 oz (139 kg)  11/22/17 299 lb (135.6 kg)    Physical Exam  Constitutional: He is oriented to person, place, and time. He appears  well-developed. No distress.  NAD  HENT:  Mouth/Throat: Oropharynx is clear and moist.  Eyes: Pupils are equal, round, and reactive to light. Conjunctivae are normal.  Neck: Normal range of motion. No JVD present. No thyromegaly present.  Cardiovascular: Normal rate, regular rhythm, normal heart sounds and intact distal pulses. Exam reveals no gallop and no friction rub.  No murmur heard. Pulmonary/Chest: Effort normal and breath sounds normal. No respiratory distress. He has no wheezes. He has no rales. He exhibits no tenderness.  Abdominal: Soft. Bowel sounds are normal. He exhibits no distension and no mass. There is no tenderness. There is no rebound and no guarding.  Musculoskeletal: Normal range  of motion. He exhibits no edema or tenderness.  Lymphadenopathy:    He has no cervical adenopathy.  Neurological: He is alert and oriented to person, place, and time. He has normal reflexes. No cranial nerve deficit. He exhibits normal muscle tone. He displays a negative Romberg sign. Coordination and gait normal.  Skin: Skin is warm and dry. No rash noted.  Psychiatric: He has a normal mood and affect. His behavior is normal. Judgment and thought content normal.     Walker Ataxic No edema hyperpigm skin  Lab Results  Component Value Date   WBC 9.8 10/25/2017   HGB 13.8 10/25/2017   HCT 42.7 10/25/2017   PLT 166 10/25/2017   GLUCOSE 102 (H) 12/29/2017   CHOL 163 12/29/2017   TRIG 63 12/29/2017   HDL 55 12/29/2017   LDLCALC 95 12/29/2017   ALT 36 12/29/2017   AST 30 12/29/2017   NA 140 12/29/2017   K 4.5 12/29/2017   CL 100 12/29/2017   CREATININE 1.06 12/29/2017   BUN 19 12/29/2017   CO2 24 12/29/2017   TSH 1.71 08/16/2017   PSA 1.85 08/16/2017   INR 1.01 10/24/2017   HGBA1C 5.8 (H) 02/06/2013    Dg C-arm 1-60 Min-no Report  Result Date: 11/07/2017 Fluoroscopy was utilized by the requesting physician.  No radiographic interpretation.    Assessment & Plan:   There  are no diagnoses linked to this encounter. I have discontinued Zayquan Bogard. Palazzi's polyethylene glycol and Polyvinyl Alcohol-Povidone PF. I am also having him maintain his loratadine, nitroGLYCERIN, multivitamin with minerals, etanercept, denosumab, EPINEPHrine, vitamin C, Calcium Citrate-Vitamin D (CALCIUM CITRATE + D PO), Probiotic Product (PROBIOTIC PO), amoxicillin, buPROPion, diltiazem, levothyroxine, levothyroxine, omeprazole, Suvorexant, amoxicillin-clavulanate, rivaroxaban, Vitamin D-3, ferrous sulfate, mirabegron ER, HYDROcodone-acetaminophen, clonazePAM, tiZANidine, doxycycline, atorvastatin, and testosterone enanthate.  Meds ordered this encounter  Medications  . testosterone enanthate (DELATESTRYL) 200 MG/ML injection    Sig: Inject 1 mL (200 mg total) into the muscle every 14 (fourteen) days. For IM use only- discard vial after 28 days    Dispense:  5 mL    Refill:  5     Follow-up: No follow-ups on file.  Walker Kehr, MD

## 2018-02-20 NOTE — Assessment & Plan Note (Signed)
Labs

## 2018-02-20 NOTE — Assessment & Plan Note (Signed)
On Rx 

## 2018-02-22 ENCOUNTER — Other Ambulatory Visit: Payer: Self-pay | Admitting: Internal Medicine

## 2018-02-22 ENCOUNTER — Encounter: Payer: Self-pay | Admitting: Internal Medicine

## 2018-02-22 MED ORDER — DOXYCYCLINE HYCLATE 100 MG PO TABS: 100.0000 mg | ORAL_TABLET | Freq: Every day | ORAL | 1 refills | Status: DC

## 2018-02-25 ENCOUNTER — Encounter: Payer: Self-pay | Admitting: Internal Medicine

## 2018-02-28 ENCOUNTER — Telehealth: Payer: Self-pay | Admitting: Internal Medicine

## 2018-02-28 NOTE — Telephone Encounter (Signed)
Copied from [place] [phone]. Topic: Quick Communication - See Telephone Encounter >> Feb 28, 2018 12:12 PM Arletha Grippe wrote: CRM for notification. See Telephone encounter for: 02/28/18.  Pt called -he has questions on how rx was written - he is supposed to get 120 tabs for 1 month, the pt only got 40 tabs.  He would rather pick up paper rx in the future  Cb is [phone] souse , can take message, on dpr

## 2018-02-28 NOTE — Telephone Encounter (Signed)
Called pt no answer LMOM RTC.../lmb 

## 2018-02-28 NOTE — Telephone Encounter (Signed)
Pt call back stating that [facility] sent only #40 of his Hydrocodone, when he supposed to get #120. [facility] inform him that due to the new opioid law they can no longer send 90 day scripts. Pt is wanting to know if MD can write rx for the remaining pills and he will take rx to a local pharmacy. Will need to cancel remaining refills w/[facility] that was sent on 02/20/18 .../lm,b

## 2018-03-02 NOTE — Telephone Encounter (Signed)
Patient calling and states that he was returning Lucy's phone call. States that he days 4 tablets per day. He states that Dr Alain Marion had wrote out 3 paper prescriptions for him at one point for him to take to the pharmacy each month. States that he spoke with [facility] at that time and they said Dr Alain Marion could've mailed those paper prescriptions to them and they would have filled those prescriptions. State that he would like to know if this is possible to do? Please advise. CB#: [phone]

## 2018-03-02 NOTE — Telephone Encounter (Signed)
How do we need to date new Rx's? Ples print new ones Thx

## 2018-03-02 NOTE — Telephone Encounter (Signed)
Called pt/spouse to see exactly how many tablet he takes a day. According to MD instructions pt is suppose to take 1 every 6 hours. which he will only received 40 tablet monthly w/[facility].Marland KitchenChryl Heck

## 2018-03-02 NOTE — Telephone Encounter (Signed)
Wife called about prescription.   She is saying they got the May,  But are needing 3 different prescriptions saying the same 30 day supply for 120 tablets.  Each dated for the next month - June, July and August -  30 day supply and sent to [facility].    She said if problem she can come pick up the 3 prescriptions for 3 different months. If so please call her when she can pick up.

## 2018-03-02 NOTE — Telephone Encounter (Signed)
LMTCB

## 2018-03-02 NOTE — Telephone Encounter (Signed)
Pt would like the next 3 months sent electronically to [facility]. Please sign. Thank you.

## 2018-03-02 NOTE — Telephone Encounter (Signed)
Okay.  Please print prescriptions I will sign them.  Thank you

## 2018-03-03 MED ORDER — HYDROCODONE-ACETAMINOPHEN 7.5-325 MG PO TABS: 1.0000 | ORAL_TABLET | Freq: Four times a day (QID) | ORAL | 0 refills | Status: DC

## 2018-03-03 NOTE — Telephone Encounter (Signed)
Pt informed -Rxs printed, signed and faxed to [facility].

## 2018-03-03 NOTE — Telephone Encounter (Signed)
Pt calling back and states to date the prescriptions for May 24th, June 24th, July 24th and August 24th.

## 2018-03-03 NOTE — Telephone Encounter (Signed)
Left mess for patient to call back. Dr. Alain Marion needs to know what dates to put for "fill on" date.

## 2018-03-08 NOTE — Telephone Encounter (Signed)
Pt notified RX ready to pick up 

## 2018-03-08 NOTE — Telephone Encounter (Signed)
Pt states he wants to come pick up paper copies of these medications. He is unhappy that [facility] has not received the medications. [facility] told him these meds cannot be faxed. Pt would like a call when hard copies are ready to pick up.

## 2018-03-17 ENCOUNTER — Other Ambulatory Visit: Payer: Self-pay | Admitting: Internal Medicine

## 2018-04-08 ENCOUNTER — Other Ambulatory Visit: Payer: Self-pay | Admitting: Internal Medicine

## 2018-04-20 ENCOUNTER — Other Ambulatory Visit: Payer: Self-pay | Admitting: Internal Medicine

## 2018-05-03 ENCOUNTER — Other Ambulatory Visit: Payer: Self-pay | Admitting: Internal Medicine

## 2018-05-29 ENCOUNTER — Other Ambulatory Visit: Payer: Self-pay | Admitting: Otolaryngology

## 2018-05-30 ENCOUNTER — Other Ambulatory Visit: Payer: Self-pay

## 2018-05-30 MED ORDER — HYDROCODONE-ACETAMINOPHEN 7.5-325 MG PO TABS: 1.0000 | ORAL_TABLET | Freq: Four times a day (QID) | ORAL | 0 refills | Status: DC

## 2018-05-30 NOTE — Addendum Note (Signed)
Addended by: Earnstine Regal on: 05/30/2018 01:55 PM   Modules accepted: Orders

## 2018-05-30 NOTE — Telephone Encounter (Signed)
MD printed script. Rx must be sent electronically to [facility]. Pls redo updated transmission to fax.Marland KitchenJohny Chess

## 2018-05-30 NOTE — Telephone Encounter (Signed)
RX request already sent to PCP

## 2018-05-31 NOTE — Addendum Note (Signed)
Addended by: Karren Cobble on: 05/31/2018 01:39 PM   Modules accepted: Orders

## 2018-06-01 ENCOUNTER — Other Ambulatory Visit (HOSPITAL_COMMUNITY): Payer: Self-pay | Admitting: Otolaryngology

## 2018-06-01 DIAGNOSIS — C099 Malignant neoplasm of tonsil, unspecified: Secondary | ICD-10-CM

## 2018-06-01 MED ORDER — HYDROCODONE-ACETAMINOPHEN 7.5-325 MG PO TABS: 1.0000 | ORAL_TABLET | Freq: Four times a day (QID) | ORAL | 0 refills | Status: DC | PRN

## 2018-06-01 MED ORDER — HYDROCODONE-ACETAMINOPHEN 7.5-325 MG PO TABS: 1.0000 | ORAL_TABLET | Freq: Four times a day (QID) | ORAL | 0 refills | Status: DC

## 2018-06-01 NOTE — Telephone Encounter (Signed)
Needs OV q 3 mo 

## 2018-06-02 ENCOUNTER — Other Ambulatory Visit: Payer: Self-pay | Admitting: Otolaryngology

## 2018-06-02 DIAGNOSIS — C099 Malignant neoplasm of tonsil, unspecified: Secondary | ICD-10-CM

## 2018-06-05 ENCOUNTER — Other Ambulatory Visit: Payer: Medicare Other

## 2018-06-06 ENCOUNTER — Telehealth: Payer: Self-pay | Admitting: Hematology

## 2018-06-06 NOTE — Progress Notes (Signed)
HEMATOLOGY/ONCOLOGY CONSULTATION NOTE  Date of Service: 06/07/2018  Patient Care Team: Plotnikov, Evie Lacks, MD as PCP - General (Internal Medicine)  CHIEF COMPLAINTS/PURPOSE OF CONSULTATION:  Diffuse Large B-Cell Lymphoma   HISTORY OF PRESENTING ILLNESS:   [name] is a wonderful 72 y.o. male who has been referred to [place] by Dr. Lew Dawes for evaluation and management of Diffuse Large B-Cell Lymphoma. He is accompanied today by his partner. The pt reports that he is doing well overall.   The pt reports that he presented to ENT Dr. Pollie Friar care after developing a cough and a new nodule in his mouth. He notes that the symptoms presented about a month ago and have not progressed or significantly worsened. He notes that he has frequent sinus drainage, and felt that this enlargement could initially be related to this. He denies any changes in his voice and difficulty breathing.   The pt notes that he is finishing three months of PT tomorrow after surgeries on his right leg and left knee. He notes that he takes blood thinners for Afib and uses his CPAP regularly now. The pt also receives testosterone injections, 200mg  every other week. The pt notes that he had several surgeries bilaterally to remove large kidney stones in the past. He notes that his thyroid replacement has been stable. His vertebral compression fracture was in 2013, after a fall in which he also broke his right femur and wrist. The pt also takes Prolia every 6 months for his osteoporosis. He has had 18 surgeries on his right leg.   He notes that his psoriasis has been stable and takes Enbrel twice a week, and has taken this for 5 years. He was on 10 years of Humara before this. He was on Methotrexate prior to [facility].   The pt notes that he functions okay at home and is able to drive himself and cook for himself and feels that he can function independently with limitations on lifting heavier objects.   On review  of systems, pt reports recent cough, left tonsil mass, stable energy levels, and denies fevers, chills, night sweats, unexpected weight loss, difficulty breathing, difficulty swallowing, noticing any other lumps or bumps, pain along the spine, recent infections, abdominal pains, difficulty urinating, leg swelling, and any other symptoms.  . On Social Hx the pt reports that he smoked cigarettes for many years until he quit 6 years ago, and drinks ETOH socially.   MEDICAL HISTORY:  Past Medical History:  Diagnosis Date  . A-fib (HCC)    hx.- sinus rhythm now  . Acid reflux   . AKI (acute kidney injury) (HCC)    mild  . Anxiety   . Arthritis   . Atrial dilatation, bilateral    Severly, enlargement  per ECHO 11/03/16  . BPH (benign prostatic hyperplasia)   . CAD (coronary artery disease)   . Depression   . Dislocation of internal right hip prosthesis, sequela   . Dysrhythmia    hx a fib  . Elevated brain natriuretic peptide (BNP) level   . Environmental allergies   . Fracture of wrist 10/23/13   LEFT  . High cholesterol   . History of Graves' disease   . History of kidney stones    surg. removal 02/2017  . History of syncope   . HLD (hyperlipidemia)   . Hypertension   . Hypothyroidism    thyroid removed  . Left knee injury    cap dislocated  . Low  testosterone, possible hypogonadism 10/01/2012  . Lumbago   . Metabolic bone disease   . Multiple falls   . Osteoporosis    Severe  . Plaque psoriasis   . Sleep apnea    cpap machine-settings 17  . Thrombocytopenia ([place])   . Thyroid disease    HX GRAVES DISEASE  . Transfusion history    s/p 12'13 hip surgery  . Unsteady gait   . UTI (urinary tract infection)   . Vertebral compression fracture (HCC)    L1-wears brace    SURGICAL HISTORY: Past Surgical History:  Procedure Laterality Date  . APPENDECTOMY    . CARDIAC CATHETERIZATION  2013  . CATARACT EXTRACTION, BILATERAL    . COLONOSCOPY    . CYSTOSCOPY WITH RETROGRADE  PYELOGRAM, URETEROSCOPY AND STENT PLACEMENT Left 11/07/2017   Procedure: CYSTOSCOPY WITH RETROGRADE PYELOGRAM, URETEROSCOPY AND STENT PLACEMENT;  Surgeon: Cleon Gustin, MD;  Location: [facility];  Service: Urology;  Laterality: Left;  . CYSTOSCOPY/URETEROSCOPY/HOLMIUM LASER/STENT PLACEMENT Right 03/11/2017   Procedure: CYSTOSCOPY/URETEROSCOPYSTENT PLACEMENT right ureter retrograde pylegram;  Surgeon: Cleon Gustin, MD;  Location: [facility];  Service: Urology;  Laterality: Right;  . HARDWARE REMOVAL  10/05/2012   Procedure: HARDWARE REMOVAL;  Surgeon: [name], MD;  [facility];  Service: Orthopedics;  Laterality: Right;  REMOVING  STRYKER  GAMMA NAIL  . HARDWARE REMOVAL Right 07/03/2013   Procedure: HARDWARE REMOVAL RIGHT TIBIA ;  Surgeon: Rozanna Box, MD;  Location: [place];  Service: Orthopedics;  Laterality: Right;  . HIP CLOSED REDUCTION Right 10/15/2013   Procedure: CLOSED MANIPULATION HIP;  Surgeon: [name], MD;  [facility];  Service: Orthopedics;  Laterality: Right;  . hip revision     June 2017  . HOLMIUM LASER APPLICATION Right 06/15/1739   Procedure: HOLMIUM LASER APPLICATION;  Surgeon: Cleon Gustin, MD;  [facility];  Service: Urology;  Laterality: Right;  . INCISION AND DRAINAGE HIP Right 11/16/2013   Procedure: IRRIGATION AND DEBRIDEMENT RIGHT HIP;  Surgeon: [name], MD;  [facility];  Service: Orthopedics;  Laterality: Right;  . IR URETERAL STENT LEFT NEW ACCESS W/O SEP NEPHROSTOMY CATH  10/24/2017  . JOINT REPLACEMENT     hip-right x2  . KNEE SURGERY Bilateral 2012   Total knee replacements  . NEPHROLITHOTOMY Right 02/08/2017   Procedure: NEPHROLITHOTOMY PERCUTANEOUS WITH SURGEON ACCESS;  Surgeon: Cleon Gustin, MD;  [facility];  Service: Urology;  Laterality: Right;  . NEPHROLITHOTOMY Left 10/24/2017   Procedure: NEPHROLITHOTOMY PERCUTANEOUS;  Surgeon: Cleon Gustin, MD;  [facility];  Service: Urology;   Laterality: Left;  . ORIF TIBIA FRACTURE Right 02/06/2013   Procedure: OPEN REDUCTION INTERNAL FIXATION (ORIF) TIBIA FRACTURE WITH IM ROD FIBULA;  Surgeon: Rozanna Box, MD;  Location: [place];  Service: Orthopedics;  Laterality: Right;  . ORIF TIBIA FRACTURE Right 07/03/2013   Procedure: RIGHT TIBIA NON UNION REPAIR ;  Surgeon: Rozanna Box, MD;  Location: [place];  Service: Orthopedics;  Laterality: Right;  . ORIF WRIST FRACTURE  10/02/2012   Procedure: OPEN REDUCTION INTERNAL FIXATION (ORIF) WRIST FRACTURE;  Surgeon: Roseanne Kaufman, MD;  [facility];  Service: Orthopedics;  Laterality: Right;  WITH   ANTIBIOTIC  CEMENT  . ORIF WRIST FRACTURE Left 10/28/2013   Procedure: OPEN REDUCTION INTERNAL FIXATION (ORIF) WRIST FRACTURE with allograft;  Surgeon: Roseanne Kaufman, MD;  [facility];  Service: Orthopedics;  Laterality: Left;  DVR Plate  . QUADRICEPS  TENDON REPAIR Left 07/15/2017   Procedure: REPAIR QUADRICEP TENDON;  Surgeon: Frederik Pear, MD;  Location: [place];  Service: Orthopedics;  Laterality: Left;  . right femur surgery  05/2012  . THYROIDECTOMY  02/1986  . TOTAL HIP REVISION  10/05/2012   Procedure: TOTAL HIP REVISION;  Surgeon: [name], MD;  [facility];  Service: Orthopedics;  Laterality: Right;  RIGHT TOTAL HIP REVISION  . TOTAL HIP REVISION Right 09/17/2013   Procedure: REVISION RIGHT TOTAL HIP ARTHROPLASTY ;  Surgeon: [name], MD;  [facility];  Service: Orthopedics;  Laterality: Right;  . TOTAL HIP REVISION Right 10/26/2013   Procedure: REVISION RIGHT TOTAL HIP ARTHROPLASTY;  Surgeon: [name], MD;  [facility];  Service: Orthopedics;  Laterality: Right;  . TOTAL HIP REVISION  03/2016  . TOTAL KNEE REVISION Left 04/11/2017   Procedure: TOTAL KNEE REVISION PATELLA and TIBIA;  Surgeon: Frederik Pear, MD;  Location: [place];  Service: Orthopedics;  Laterality: Left;  . WRIST FRACTURE SURGERY  05/2012    SOCIAL HISTORY: Social History    Socioeconomic History  . Marital status: Married    Spouse name: Not on file  . Number of children: Not on file  . Years of education: Not on file  . Highest education level: Not on file  Occupational History  . Not on file  Social Needs  . Financial resource strain: Not on file  . Food insecurity:    Worry: Not on file    Inability: Not on file  . Transportation needs:    Medical: Not on file    Non-medical: Not on file  Tobacco Use  . Smoking status: Former Smoker    Last attempt to quit: 06/05/2012    Years since quitting: 6.0  . Smokeless tobacco: Never Used  Substance and Sexual Activity  . Alcohol use: Yes    Comment: occasional-social  . Drug use: No  . Sexual activity: Yes  Lifestyle  . Physical activity:    Days per week: Not on file    Minutes per session: Not on file  . Stress: Not on file  Relationships  . Social connections:    Talks on phone: Not on file    Gets together: Not on file    Attends religious service: Not on file    Active member of club or organization: Not on file    Attends meetings of clubs or organizations: Not on file    Relationship status: Not on file  . Intimate partner violence:    Fear of current or ex partner: Not on file    Emotionally abused: Not on file    Physically abused: Not on file    Forced sexual activity: Not on file  Other Topics Concern  . Not on file  Social History Narrative   [place] 2.5 months, went home Feb 21st slipped and fell on back and developed.  Home PT/OT.  Just started outpatient physical therapy.  Friday night, misstepped.      FAMILY HISTORY: Family History  Problem Relation Age of Onset  . CAD Father 25  . Asthma Father   . Alcohol abuse Father   . Arthritis Mother   . Alcohol abuse Sister     ALLERGIES:  is allergic to short ragweed pollen ext.  MEDICATIONS:  Current Outpatient Medications  Medication Sig Dispense Refill  . amoxicillin (AMOXIL) 500 MG capsule Take 2,000 mg by mouth  See admin instructions. Takes 4 capsules  1 hour prior to dental procedures    . amoxicillin-clavulanate (AUGMENTIN) 875-125 MG tablet Take 1 tablet by mouth daily at 12 noon.    [name] atorvastatin (LIPITOR) 80 MG tablet Take 80 mg by mouth daily.    [name] buPROPion (WELLBUTRIN XL) 300 MG 24 hr tablet TAKE 1 TABLET DAILY WITH  BREAKFAST BY MOUTH. 90 tablet 3  . Calcium Citrate-Vitamin D (CALCIUM CITRATE + D PO) Take 1 tablet by mouth 2 (two) times daily.     . Cholecalciferol (VITAMIN D-3) 5000 units TABS Take 5,000 Units by mouth daily.    . clonazePAM (KLONOPIN) 1 MG tablet TAKE 1 TABLET BY MOUTH  EVERY 6 HOURS prn 360 tablet 0  . denosumab (PROLIA) 60 MG/ML SOLN injection Inject 60 mg into the skin every 6 (six) months. Administer in upper arm, thigh, or abdomen    . diltiazem (CARDIZEM CD) 240 MG 24 hr capsule Take 1 capsule (240 mg total) daily by mouth. (Patient taking differently: Take 240 mg by mouth daily with breakfast. (0800-0830)) 90 capsule 3  . doxycycline (VIBRA-TABS) 100 MG tablet Take 1 tablet (100 mg total) by mouth daily. 90 tablet 1  . DULoxetine (CYMBALTA) 60 MG capsule TAKE 1 CAPSULE BY MOUTH  TWICE DAILY 180 capsule 1  . EPINEPHrine (EPIPEN 2-PAK) 0.3 mg/0.3 mL IJ SOAJ injection Inject 0.3 mg into the muscle daily as needed (for anaphylactic reaction).    [name] etanercept (ENBREL) 50 MG/ML injection Inject 50 mg into the skin 2 (two) times a week. Tuesday & Friday    . ferrous sulfate 325 (65 FE) MG tablet Take 325 mg by mouth 2 (two) times daily with a meal.    . HYDROcodone-acetaminophen (NORCO) 7.5-325 MG tablet Take 1 tablet by mouth 4 (four) times daily. Please fill on 06/03/18 120 tablet 0  . HYDROcodone-acetaminophen (NORCO) 7.5-325 MG tablet Take 1 tablet by mouth 4 (four) times daily as needed for moderate pain. Please fill on 07/03/18 120 tablet 0  . HYDROcodone-acetaminophen (NORCO) 7.5-325 MG tablet Take 1 tablet by mouth 4 (four) times daily as needed for moderate pain. Please  fill on 08/02/18 120 tablet 0  . levothyroxine (SYNTHROID, LEVOTHROID) 200 MCG tablet TAKE 1 TABLET DAILY AT 6 AM BY MOUTH. 90 tablet 3  . levothyroxine (SYNTHROID, LEVOTHROID) 25 MCG tablet TAKE 1 TABLET BY MOUTH  DAILY BEFORE BREAKFAST. 90 tablet 3  . loratadine (CLARITIN) 10 MG tablet Take 10 mg by mouth daily.     . mirabegron ER (MYRBETRIQ) 25 MG TB24 tablet Take 25 mg by mouth at bedtime.    . Multiple Vitamin (MULTIVITAMIN WITH MINERALS) TABS tablet Take 1 tablet by mouth daily. Men's One-A-Day 53+    . nitroGLYCERIN (NITROSTAT) 0.4 MG SL tablet Place 0.4 mg under the tongue every 5 (five) minutes as needed for chest pain. x3 doses as needed for chest pain    . omeprazole (PRILOSEC) 40 MG capsule TAKE 1 CAPSULE DAILY BEFORE BREAKFAST BY MOUTH. 90 capsule 3  . Probiotic Product (PROBIOTIC PO) Take 1 capsule by mouth daily with breakfast.    . rivaroxaban (XARELTO) 20 MG TABS tablet Take 20 mg by mouth daily with breakfast.    . Suvorexant (BELSOMRA) 15 MG TABS Take 15 mg by mouth at bedtime. 90 tablet 1  . testosterone enanthate (DELATESTRYL) 200 MG/ML injection Inject 1 mL (200 mg total) into the muscle every 14 (fourteen) days. For IM use only- discard vial after 28 days 5 mL 5  .  tiZANidine (ZANAFLEX) 4 MG tablet TAKE 1 TABLET BY MOUTH  EVERY 6 HOURS AS NEEDED FOR MUSCLE SPASM(S) 360 tablet 1  . vitamin C (ASCORBIC ACID) 500 MG tablet Take 500 mg by mouth daily.    Alveda Reasons 10 MG TABS tablet TAKE 1 TABLET DAILY BY  MOUTH. 90 tablet 3   No current facility-administered medications for this visit.     REVIEW OF SYSTEMS:    10 Point review of Systems was done is negative except as noted above.  PHYSICAL EXAMINATION: ECOG PERFORMANCE STATUS: 2 - Symptomatic, <50% confined to bed  . Vitals:   06/07/18 1334  BP: 139/80  Pulse: (!) 109  Resp: 18  Temp: 97.9 F (36.6 C)  SpO2: 93%   Filed Weights   06/07/18 1334  Weight: (!) 301 lb 1.6 oz (136.6 kg)   .Body mass index is  36.65 kg/m.  GENERAL:alert, in no acute distress and comfortable SKIN: chronic venous stasis changes in bilateral lower extremities, psoriasis rashes on elbows EYES: conjunctiva are pink and non-injected, sclera anicteric OROPHARYNX: enlarged left tonsil with erythema extending up to midline in the posterior cavity NECK: supple, no JVD LYMPH:  no palpable lymphadenopathy in the cervical, axillary or inguinal regions LUNGS: clear to auscultation b/l with normal respiratory effort HEART: regular rate & rhythm ABDOMEN:  normoactive bowel sounds , non tender, not distended. Extremity: no pedal edema PSYCH: alert & oriented x 3 with fluent speech NEURO: no focal motor/sensory deficits  LABORATORY DATA:  I have reviewed the data as listed  . CBC Latest Ref Rng & Units 02/20/2018 10/25/2017 10/24/2017  WBC 4.0 - 10.5 K/uL 5.7 9.8 5.1  Hemoglobin 13.0 - 17.0 g/dL 17.7(H) 13.8 13.7  Hematocrit 39.0 - 52.0 % 52.6(H) 42.7 42.3  Platelets 150.0 - 400.0 K/uL 159.0 166 148(L)    . CMP Latest Ref Rng & Units 02/20/2018 12/29/2017 10/25/2017  Glucose 70 - 99 mg/dL 94 102(H) 130(H)  BUN 6 - 23 mg/dL 23 19 19   Creatinine 0.40 - 1.50 mg/dL 1.27 1.06 1.22  Sodium 135 - 145 mEq/L 140 140 136  Potassium 3.5 - 5.1 mEq/L 4.3 4.5 4.5  Chloride 96 - 112 mEq/L 102 100 101  CO2 19 - 32 mEq/L 33(H) 24 29  Calcium 8.4 - 10.5 mg/dL 9.2 8.9 8.7(L)  Total Protein 6.0 - 8.5 g/dL - 7.0 -  Total Bilirubin 0.0 - 1.2 mg/dL - 0.6 -  Alkaline Phos 39 - 117 IU/L - 63 -  AST 0 - 40 IU/L - 30 -  ALT 0 - 44 IU/L - 36 -   05/29/18 Left tonsil biopsy:   RADIOGRAPHIC STUDIES: I have personally reviewed the radiological images as listed and agreed with the findings in the report. No results found.  ASSESSMENT & PLAN:  72 y.o. male with  1. Diffuse Large B-Cell Lymphoma Left tonsil PLAN -Discussed the 05/29/18 Left tonsil biopsy which revealed Diffuse Large B-Cell Lymphoma -Discussed that DLBCL is an intermediate  grade lymphoma that does require treatment upon diagnosis -Will order PET/CT scan for staging -Will order ECHO -Will order blood tests today -Discussed port placement with the pt, he is agreeable to having a port, will refer the pt to IR for Port placement -Recommend stopping Enbrel and recommend not adding any new treatments for psoriasis or immunosuppressive therapies  -Will order Kenalog for psoriasis   -Discussed the standard regimen of R-CHOP with G-CSF support  -Will set the pt up for chemotherapy counseling -Will discuss this  patient's case with my tumor board  -Recommended that the pt focus on staying hydrated, walking, and eating well while on treatment -Provided supplemental information about DLBCL -Recommend that the pt receive annual flu vaccination -Recommend that the patient receive the lowest dose of testosterone possible while on treatment  -Will see the pt back in 2 weeks with tentative start of R-CHOP   2.  Patient Active Problem List   Diagnosis Date Noted  . Hypogonadism in male 11/22/2017  . Nephrolithiasis 10/24/2017  . Recurrent dislocation of left patella 07/15/2017  . Recurrent subluxation of patella, left 07/08/2017  . S/P revision of total knee 04/11/2017  . Failed total knee, left, initial encounter ([place]) 04/09/2017  . Renal calculus, right 02/08/2017  . Protein-calorie malnutrition, severe ([place]) 12/08/2016  . Chronic obstructive pulmonary disease ([place]) 12/08/2016  . Staghorn renal calculus 11/05/2016  . Wound drainage right hip 11/15/2013  . Expected blood loss anemia 09/18/2013  . Hyponatremia 09/18/2013  . S/P right TH revision 09/17/2013  . Nonunion, fracture, Right tibia  07/03/2013  . UTI (urinary tract infection) 07/03/2013  . Pathological fracture due to osteoporosis with delayed healing 04/24/2013  . Osteoporosis with fracture 04/24/2013  . Rash and nonspecific skin eruption 03/08/2013  . Syncope 02/21/2013  . PAF (paroxysmal atrial  fibrillation) ([place]) 02/21/2013  . Chronic anticoagulation 02/21/2013  . Fall 02/08/2013  . Physical deconditioning 02/08/2013  . Low back pain radiating to both legs 02/08/2013  . Compression fracture of L1 lumbar vertebra (HCC) 02/08/2013  . Right tibial fracture 02/05/2013  . Obesity, unspecified 02/05/2013  . Osteomyelitis of right wrist ([place]) 11/02/2012  . Anemia 10/06/2012  . HTN (hypertension) 10/03/2012  . Psoriasis 10/03/2012  . Dyslipidemia 10/03/2012  . Vitamin D deficiency 10/03/2012  . GERD (gastroesophageal reflux disease) 10/03/2012  . Hyperglycemia 10/03/2012  . Anxiety 10/03/2012  . Depression 10/03/2012  . CAD (coronary artery disease) 10/03/2012  . DJD (degenerative joint disease) 10/03/2012  . Chronic gingivitis 10/03/2012  . Low testosterone, possible hypogonadism 10/01/2012  . Normocytic anemia 09/30/2012  . Right femoral fracture ([place]) 09/29/2012  . Right forearm fracture 09/29/2012  . Atrial fibrillation ([place]) 09/29/2012  . Hypothyroidism 09/29/2012  . History of recurrent UTIs 09/29/2012   -continue mx of medical co-morbidities with PCP  Labs today PET/CT in 5 days Port a cath placement in 1 week ECHO 3-5 days  Chemo-counseling for R-CHOP in 10-12 days R-CHOP chemotherapy with Neulasta to starting in 2 weeks with labs and MD visit   All of the patients questions were answered with apparent satisfaction. The patient knows to call the clinic with any problems, questions or concerns.  The total time spent in the appt was 60 minutes and more than 50% was on counseling and direct patient cares.    Joseph Lone MD MS AAHIVMS [facility] Hematology/Oncology Physician [facility]  (Office):       [phone] (Work cell):  [phone] (Fax):           [phone]  06/07/2018 2:49 PM  I, Baldwin [place], am acting as a scribe for Dr. Irene Limbo  .I have reviewed the above documentation for accuracy and completeness, and I agree with the  above. Brunetta Genera MD

## 2018-06-06 NOTE — Telephone Encounter (Signed)
Received a call from Dr. Radene Journey to schedule an oncology appt for the pt for lft tonsil B cell lymphoma. Pt has been scheduled to see Dr. Irene Limbo on 8/28 at 130pm. Pt aware to arrive 30 minutes early.

## 2018-06-07 ENCOUNTER — Inpatient Hospital Stay: Payer: Medicare Other

## 2018-06-07 ENCOUNTER — Encounter: Payer: Self-pay | Admitting: Hematology

## 2018-06-07 ENCOUNTER — Inpatient Hospital Stay: Payer: Medicare Other | Attending: Hematology | Admitting: Hematology

## 2018-06-07 ENCOUNTER — Telehealth: Payer: Self-pay | Admitting: Hematology

## 2018-06-07 VITALS — BP 139/80 | HR 109 | Temp 97.9°F | Resp 18 | Ht 76.0 in | Wt 301.1 lb

## 2018-06-07 DIAGNOSIS — L409 Psoriasis, unspecified: Secondary | ICD-10-CM | POA: Insufficient documentation

## 2018-06-07 DIAGNOSIS — C8339 Diffuse large B-cell lymphoma, extranodal and solid organ sites: Secondary | ICD-10-CM | POA: Diagnosis present

## 2018-06-07 DIAGNOSIS — C8331 Diffuse large B-cell lymphoma, lymph nodes of head, face, and neck: Secondary | ICD-10-CM

## 2018-06-07 LAB — CBC WITH DIFFERENTIAL/PLATELET
BASOS ABS: 0 10*3/uL (ref 0.0–0.1)
BASOS PCT: 0 %
EOS PCT: 3 %
Eosinophils Absolute: 0.2 10*3/uL (ref 0.0–0.5)
HEMATOCRIT: 50 % — AB (ref 38.4–49.9)
Hemoglobin: 16.6 g/dL (ref 13.0–17.1)
Lymphocytes Relative: 31 %
Lymphs Abs: 2.1 10*3/uL (ref 0.9–3.3)
MCH: 30.8 pg (ref 27.2–33.4)
MCHC: 33.2 g/dL (ref 32.0–36.0)
MCV: 92.8 fL (ref 79.3–98.0)
MONO ABS: 0.5 10*3/uL (ref 0.1–0.9)
Monocytes Relative: 7 %
NEUTROS ABS: 4 10*3/uL (ref 1.5–6.5)
Neutrophils Relative %: 59 %
PLATELETS: 173 10*3/uL (ref 140–400)
RBC: 5.39 MIL/uL (ref 4.20–5.82)
RDW: 14.6 % (ref 11.0–14.6)
WBC: 6.8 10*3/uL (ref 4.0–10.3)

## 2018-06-07 LAB — CMP (CANCER CENTER ONLY)
ALK PHOS: 83 U/L (ref 38–126)
ALT: 38 U/L (ref 0–44)
ANION GAP: 8 (ref 5–15)
AST: 31 U/L (ref 15–41)
Albumin: 3.5 g/dL (ref 3.5–5.0)
BUN: 27 mg/dL — ABNORMAL HIGH (ref 8–23)
CALCIUM: 9.6 mg/dL (ref 8.9–10.3)
CHLORIDE: 103 mmol/L (ref 98–111)
CO2: 27 mmol/L (ref 22–32)
Creatinine: 1.32 mg/dL — ABNORMAL HIGH (ref 0.61–1.24)
GFR, Estimated: 52 mL/min — ABNORMAL LOW (ref >60)
Glucose, Bld: 80 mg/dL (ref 70–99)
Potassium: 4.5 mmol/L (ref 3.5–5.1)
SODIUM: 138 mmol/L (ref 135–145)
Total Bilirubin: 1.1 mg/dL (ref 0.3–1.2)
Total Protein: 7.7 g/dL (ref 6.5–8.1)

## 2018-06-07 LAB — RETICULOCYTES
RBC.: 5.39 MIL/uL (ref 4.20–5.82)
RETIC COUNT ABSOLUTE: 113.2 10*3/uL — AB (ref 34.8–93.9)
Retic Ct Pct: 2.1 % — ABNORMAL HIGH (ref 0.8–1.8)

## 2018-06-07 LAB — LACTATE DEHYDROGENASE: LDH: 214 U/L — AB (ref 98–192)

## 2018-06-07 MED ORDER — TRIAMCINOLONE ACETONIDE 0.5 % EX OINT: 1.0000 "application " | TOPICAL_OINTMENT | Freq: Two times a day (BID) | CUTANEOUS | 1 refills | Status: DC

## 2018-06-07 NOTE — Telephone Encounter (Signed)
Gave pt avs and calendar  °

## 2018-06-08 LAB — HEPATITIS B SURFACE ANTIGEN: HEP B S AG: NEGATIVE

## 2018-06-08 LAB — HIV ANTIBODY (ROUTINE TESTING W REFLEX): HIV SCREEN 4TH GENERATION: NONREACTIVE

## 2018-06-08 LAB — HEPATITIS B CORE ANTIBODY, TOTAL: HEP B C TOTAL AB: NEGATIVE

## 2018-06-08 LAB — HEPATITIS C ANTIBODY: HCV Ab: <0.1 s/co ratio (ref 0.0–0.9)

## 2018-06-10 ENCOUNTER — Other Ambulatory Visit: Payer: Self-pay | Admitting: Internal Medicine

## 2018-06-13 ENCOUNTER — Telehealth: Payer: Self-pay

## 2018-06-13 NOTE — Telephone Encounter (Signed)
Patient aware of port a cath placement scheduled on 06/20/18 at [facility] with arrival at 7am. Patient aware nothing to eat or drink after midnight and that he will need a driver. Per Caryl Pina in Radiology, patient to hold Xarelto starting 06/18/18. Patient stated he will contact his cardiologist Dr. Skeet Latch to make her aware of holding the Xarelto. Patient verbalized understanding of all instructions.

## 2018-06-14 ENCOUNTER — Encounter: Payer: Self-pay | Admitting: Pharmacist

## 2018-06-14 ENCOUNTER — Ambulatory Visit (HOSPITAL_COMMUNITY): Payer: Medicare Other

## 2018-06-14 ENCOUNTER — Ambulatory Visit (HOSPITAL_COMMUNITY)
Admission: RE | Admit: 2018-06-14 | Discharge: 2018-06-14 | Disposition: A | Discharge status: Home / Self Care | Payer: Medicare Other | Source: Ambulatory Visit | Attending: Hematology | Admitting: Hematology

## 2018-06-14 ENCOUNTER — Inpatient Hospital Stay: Payer: Medicare Other | Attending: Hematology

## 2018-06-14 DIAGNOSIS — I371 Nonrheumatic pulmonary valve insufficiency: Secondary | ICD-10-CM | POA: Diagnosis not present

## 2018-06-14 DIAGNOSIS — C8331 Diffuse large B-cell lymphoma, lymph nodes of head, face, and neck: Secondary | ICD-10-CM | POA: Insufficient documentation

## 2018-06-14 DIAGNOSIS — C8339 Diffuse large B-cell lymphoma, extranodal and solid organ sites: Secondary | ICD-10-CM | POA: Insufficient documentation

## 2018-06-14 DIAGNOSIS — I081 Rheumatic disorders of both mitral and tricuspid valves: Secondary | ICD-10-CM | POA: Diagnosis not present

## 2018-06-14 DIAGNOSIS — R55 Syncope and collapse: Secondary | ICD-10-CM | POA: Diagnosis not present

## 2018-06-14 DIAGNOSIS — I2510 Atherosclerotic heart disease of native coronary artery without angina pectoris: Secondary | ICD-10-CM | POA: Diagnosis not present

## 2018-06-14 DIAGNOSIS — Z5189 Encounter for other specified aftercare: Secondary | ICD-10-CM | POA: Insufficient documentation

## 2018-06-14 DIAGNOSIS — I4891 Unspecified atrial fibrillation: Secondary | ICD-10-CM | POA: Diagnosis not present

## 2018-06-14 DIAGNOSIS — Z01818 Encounter for other preprocedural examination: Secondary | ICD-10-CM | POA: Diagnosis not present

## 2018-06-14 DIAGNOSIS — Z5112 Encounter for antineoplastic immunotherapy: Secondary | ICD-10-CM | POA: Insufficient documentation

## 2018-06-14 DIAGNOSIS — Z5111 Encounter for antineoplastic chemotherapy: Secondary | ICD-10-CM | POA: Insufficient documentation

## 2018-06-14 NOTE — Progress Notes (Signed)
  2D Echocardiogram has been performed.  [name] 06/14/2018, 10:16 AM

## 2018-06-16 ENCOUNTER — Ambulatory Visit (HOSPITAL_COMMUNITY)
Admission: RE | Admit: 2018-06-16 | Discharge: 2018-06-16 | Disposition: A | Discharge status: Home / Self Care | Payer: Medicare Other | Source: Ambulatory Visit | Attending: Hematology | Admitting: Hematology

## 2018-06-16 ENCOUNTER — Other Ambulatory Visit: Payer: Self-pay

## 2018-06-16 ENCOUNTER — Other Ambulatory Visit: Payer: Self-pay | Admitting: Radiology

## 2018-06-16 DIAGNOSIS — J439 Emphysema, unspecified: Secondary | ICD-10-CM | POA: Insufficient documentation

## 2018-06-16 DIAGNOSIS — I2510 Atherosclerotic heart disease of native coronary artery without angina pectoris: Secondary | ICD-10-CM | POA: Insufficient documentation

## 2018-06-16 DIAGNOSIS — X58XXXD Exposure to other specified factors, subsequent encounter: Secondary | ICD-10-CM | POA: Diagnosis not present

## 2018-06-16 DIAGNOSIS — S2241XK Multiple fractures of ribs, right side, subsequent encounter for fracture with nonunion: Secondary | ICD-10-CM | POA: Insufficient documentation

## 2018-06-16 DIAGNOSIS — I700 Atherosclerosis of aorta: Secondary | ICD-10-CM | POA: Insufficient documentation

## 2018-06-16 DIAGNOSIS — K429 Umbilical hernia without obstruction or gangrene: Secondary | ICD-10-CM | POA: Insufficient documentation

## 2018-06-16 DIAGNOSIS — C8331 Diffuse large B-cell lymphoma, lymph nodes of head, face, and neck: Secondary | ICD-10-CM

## 2018-06-16 DIAGNOSIS — N200 Calculus of kidney: Secondary | ICD-10-CM | POA: Insufficient documentation

## 2018-06-16 LAB — GLUCOSE, CAPILLARY: GLUCOSE-CAPILLARY: 94 mg/dL (ref 70–99)

## 2018-06-16 MED ORDER — FLUDEOXYGLUCOSE F - 18 (FDG) INJECTION
15.3000 | Freq: Once | INTRAVENOUS | Status: AC
  Administered 2018-06-16: 15.3 via INTRAVENOUS

## 2018-06-19 ENCOUNTER — Other Ambulatory Visit: Payer: Self-pay | Admitting: Radiology

## 2018-06-20 ENCOUNTER — Ambulatory Visit (HOSPITAL_COMMUNITY)
Admission: RE | Admit: 2018-06-20 | Discharge: 2018-06-20 | Disposition: A | Discharge status: Home / Self Care | Payer: Medicare Other | Source: Ambulatory Visit | Attending: Hematology | Admitting: Hematology

## 2018-06-20 ENCOUNTER — Encounter (HOSPITAL_COMMUNITY): Payer: Self-pay

## 2018-06-20 DIAGNOSIS — C8331 Diffuse large B-cell lymphoma, lymph nodes of head, face, and neck: Secondary | ICD-10-CM

## 2018-06-20 MED ORDER — SODIUM CHLORIDE 0.9 % IV SOLN: INTRAVENOUS | Status: DC

## 2018-06-20 MED ORDER — DEXTROSE 5 % IV SOLN
3.0000 g | INTRAVENOUS | Status: DC
  Filled 2018-06-20: qty 3000

## 2018-06-21 ENCOUNTER — Other Ambulatory Visit: Payer: Self-pay | Admitting: Radiology

## 2018-06-22 ENCOUNTER — Other Ambulatory Visit: Payer: Self-pay | Admitting: Internal Medicine

## 2018-06-22 ENCOUNTER — Telehealth: Payer: Self-pay | Admitting: Internal Medicine

## 2018-06-22 ENCOUNTER — Encounter (HOSPITAL_COMMUNITY): Payer: Self-pay | Admitting: Interventional Radiology

## 2018-06-22 ENCOUNTER — Other Ambulatory Visit: Payer: Self-pay

## 2018-06-22 ENCOUNTER — Ambulatory Visit (HOSPITAL_COMMUNITY)
Admission: RE | Admit: 2018-06-22 | Discharge: 2018-06-22 | Disposition: A | Discharge status: Home / Self Care | Payer: Medicare Other | Source: Ambulatory Visit | Attending: Hematology | Admitting: Hematology

## 2018-06-22 DIAGNOSIS — I4891 Unspecified atrial fibrillation: Secondary | ICD-10-CM | POA: Diagnosis not present

## 2018-06-22 DIAGNOSIS — F329 Major depressive disorder, single episode, unspecified: Secondary | ICD-10-CM | POA: Diagnosis not present

## 2018-06-22 DIAGNOSIS — Z87891 Personal history of nicotine dependence: Secondary | ICD-10-CM | POA: Diagnosis not present

## 2018-06-22 DIAGNOSIS — Z7901 Long term (current) use of anticoagulants: Secondary | ICD-10-CM | POA: Insufficient documentation

## 2018-06-22 DIAGNOSIS — G4730 Sleep apnea, unspecified: Secondary | ICD-10-CM | POA: Insufficient documentation

## 2018-06-22 DIAGNOSIS — I10 Essential (primary) hypertension: Secondary | ICD-10-CM | POA: Insufficient documentation

## 2018-06-22 DIAGNOSIS — I2510 Atherosclerotic heart disease of native coronary artery without angina pectoris: Secondary | ICD-10-CM | POA: Insufficient documentation

## 2018-06-22 DIAGNOSIS — K219 Gastro-esophageal reflux disease without esophagitis: Secondary | ICD-10-CM | POA: Insufficient documentation

## 2018-06-22 DIAGNOSIS — M1990 Unspecified osteoarthritis, unspecified site: Secondary | ICD-10-CM | POA: Insufficient documentation

## 2018-06-22 DIAGNOSIS — C8330 Diffuse large B-cell lymphoma, unspecified site: Secondary | ICD-10-CM | POA: Insufficient documentation

## 2018-06-22 DIAGNOSIS — E7800 Pure hypercholesterolemia, unspecified: Secondary | ICD-10-CM | POA: Insufficient documentation

## 2018-06-22 DIAGNOSIS — L400 Psoriasis vulgaris: Secondary | ICD-10-CM | POA: Diagnosis not present

## 2018-06-22 DIAGNOSIS — N400 Enlarged prostate without lower urinary tract symptoms: Secondary | ICD-10-CM | POA: Insufficient documentation

## 2018-06-22 DIAGNOSIS — F419 Anxiety disorder, unspecified: Secondary | ICD-10-CM | POA: Insufficient documentation

## 2018-06-22 DIAGNOSIS — M810 Age-related osteoporosis without current pathological fracture: Secondary | ICD-10-CM | POA: Insufficient documentation

## 2018-06-22 DIAGNOSIS — C8510 Unspecified B-cell lymphoma, unspecified site: Secondary | ICD-10-CM | POA: Diagnosis present

## 2018-06-22 DIAGNOSIS — E890 Postprocedural hypothyroidism: Secondary | ICD-10-CM | POA: Diagnosis not present

## 2018-06-22 HISTORY — PX: IR IMAGING GUIDED PORT INSERTION: IMG5740

## 2018-06-22 LAB — PROTIME-INR
INR: 1.02
PROTHROMBIN TIME: 13.3 s (ref 11.4–15.2)

## 2018-06-22 LAB — BASIC METABOLIC PANEL
Anion gap: 11 (ref 5–15)
BUN: 25 mg/dL — ABNORMAL HIGH (ref 8–23)
CALCIUM: 9.5 mg/dL (ref 8.9–10.3)
CHLORIDE: 102 mmol/L (ref 98–111)
CO2: 24 mmol/L (ref 22–32)
Creatinine, Ser: 1.32 mg/dL — ABNORMAL HIGH (ref 0.61–1.24)
GFR calc non Af Amer: 52 mL/min — ABNORMAL LOW (ref >60)
Glucose, Bld: 128 mg/dL — ABNORMAL HIGH (ref 70–99)
Potassium: 4.2 mmol/L (ref 3.5–5.1)
Sodium: 137 mmol/L (ref 135–145)

## 2018-06-22 LAB — CBC
HCT: 53.3 % — ABNORMAL HIGH (ref 39.0–52.0)
HEMOGLOBIN: 17.3 g/dL — AB (ref 13.0–17.0)
MCH: 30.1 pg (ref 26.0–34.0)
MCHC: 32.5 g/dL (ref 30.0–36.0)
MCV: 92.7 fL (ref 78.0–100.0)
Platelets: 174 10*3/uL (ref 150–400)
RBC: 5.75 MIL/uL (ref 4.22–5.81)
RDW: 14.2 % (ref 11.5–15.5)
WBC: 6.9 10*3/uL (ref 4.0–10.5)

## 2018-06-22 MED ORDER — LIDOCAINE HCL (PF) 1 % IJ SOLN
INTRAMUSCULAR | Status: AC | PRN
  Administered 2018-06-22: 20 mL

## 2018-06-22 MED ORDER — CEFAZOLIN SODIUM-DEXTROSE 2-4 GM/100ML-% IV SOLN
2.0000 g | INTRAVENOUS | Status: AC
  Administered 2018-06-22: 2 g via INTRAVENOUS

## 2018-06-22 MED ORDER — MIDAZOLAM HCL 2 MG/2ML IJ SOLN
INTRAMUSCULAR | Status: AC
  Filled 2018-06-22: qty 4

## 2018-06-22 MED ORDER — LIDOCAINE HCL (PF) 1 % IJ SOLN
INTRAMUSCULAR | Status: AC
  Filled 2018-06-22: qty 30

## 2018-06-22 MED ORDER — SODIUM CHLORIDE 0.9 % IV SOLN: INTRAVENOUS | Status: DC

## 2018-06-22 MED ORDER — FENTANYL CITRATE (PF) 100 MCG/2ML IJ SOLN
INTRAMUSCULAR | Status: AC | PRN
  Administered 2018-06-22: 50 ug via INTRAVENOUS
  Administered 2018-06-22: 25 ug via INTRAVENOUS

## 2018-06-22 MED ORDER — FENTANYL CITRATE (PF) 100 MCG/2ML IJ SOLN
INTRAMUSCULAR | Status: AC
  Filled 2018-06-22: qty 4

## 2018-06-22 MED ORDER — MIDAZOLAM HCL 2 MG/2ML IJ SOLN
INTRAMUSCULAR | Status: AC | PRN
  Administered 2018-06-22: 1 mg via INTRAVENOUS
  Administered 2018-06-22: 0.5 mg via INTRAVENOUS

## 2018-06-22 MED ORDER — CEFAZOLIN SODIUM-DEXTROSE 2-4 GM/100ML-% IV SOLN
INTRAVENOUS | Status: AC
  Filled 2018-06-22: qty 100

## 2018-06-22 MED ORDER — HEPARIN SOD (PORK) LOCK FLUSH 100 UNIT/ML IV SOLN
INTRAVENOUS | Status: AC
  Filled 2018-06-22: qty 5

## 2018-06-22 NOTE — Discharge Instructions (Signed)
Implanted Port Home Guide An implanted port is a type of central line that is placed under the skin. Central lines are used to provide IV access when treatment or nutrition needs to be given through a person's veins. Implanted ports are used for long-term IV access. An implanted port may be placed because:  You need IV medicine that would be irritating to the small veins in your hands or arms.  You need long-term IV medicines, such as antibiotics.  You need IV nutrition for a long period.  You need frequent blood draws for lab tests.  You need dialysis.  Implanted ports are usually placed in the chest area, but they can also be placed in the upper arm, the abdomen, or the leg. An implanted port has two main parts:  Reservoir. The reservoir is round and will appear as a small, raised area under your skin. The reservoir is the part where a needle is inserted to give medicines or draw blood.  Catheter. The catheter is a thin, flexible tube that extends from the reservoir. The catheter is placed into a large vein. Medicine that is inserted into the reservoir goes into the catheter and then into the vein.  How will I care for my incision site? Do not get the incision site wet. Bathe or shower as directed by your health care provider. How is my port accessed? Special steps must be taken to access the port:  Before the port is accessed, a numbing cream can be placed on the skin. This helps numb the skin over the port site.  Your health care provider uses a sterile technique to access the port. ? Your health care provider must put on a mask and sterile gloves. ? The skin over your port is cleaned carefully with an antiseptic and allowed to dry. ? The port is gently pinched between sterile gloves, and a needle is inserted into the port.  Only "non-coring" port needles should be used to access the port. Once the port is accessed, a blood return should be checked. This helps ensure that the port  is in the vein and is not clogged.  If your port needs to remain accessed for a constant infusion, a clear (transparent) bandage will be placed over the needle site. The bandage and needle will need to be changed every week, or as directed by your health care provider.  Keep the bandage covering the needle clean and dry. Do not get it wet. Follow your health care provider's instructions on how to take a shower or bath while the port is accessed.  If your port does not need to stay accessed, no bandage is needed over the port.  What is flushing? Flushing helps keep the port from getting clogged. Follow your health care provider's instructions on how and when to flush the port. Ports are usually flushed with saline solution or a medicine called heparin. The need for flushing will depend on how the port is used.  If the port is used for intermittent medicines or blood draws, the port will need to be flushed: ? After medicines have been given. ? After blood has been drawn. ? As part of routine maintenance.  If a constant infusion is running, the port may not need to be flushed.  How long will my port stay implanted? The port can stay in for as long as your health care provider thinks it is needed. When it is time for the port to come out, surgery will be   done to remove it. The procedure is similar to the one performed when the port was put in. When should I seek immediate medical care? When you have an implanted port, you should seek immediate medical care if:  You notice a bad smell coming from the incision site.  You have swelling, redness, or drainage at the incision site.  You have more swelling or pain at the port site or the surrounding area.  You have a fever that is not controlled with medicine.  This information is not intended to replace advice given to you by your health care provider. Make sure you discuss any questions you have with your health care provider. Document  Released: 09/27/2005 Document Revised: 03/04/2016 Document Reviewed: 06/04/2013 Elsevier Interactive Patient Education  2017 Elsevier Inc[employer]  

## 2018-06-22 NOTE — H&P (Signed)
Chief Complaint: Patient was seen in consultation today for diffuse large B-cell lymphoma  Referring Physician(s): Brunetta Genera  Supervising Physician: Corrie Mckusick  Patient Status: [facility] - Out-pt  History of Present Illness: [name] is a 72 y.o. male with past medical history of a fib, CAD, HTN recently diagnosed with diffuse large B-cell lymphoma through tissue diagnosis of the left tonsil.  Patient now with plans for chemotherapy and is in need of durable venous access.   He presents to radiology today for [facility] placement at the request of Dr. Irene Limbo.  He has been NPO.  He takes Xarelto, but held appropriately for procedure today.   Past Medical History:  Diagnosis Date  . A-fib (HCC)    hx.- sinus rhythm now  . Acid reflux   . AKI (acute kidney injury) (HCC)    mild  . Anxiety   . Arthritis   . Atrial dilatation, bilateral    Severly, enlargement  per ECHO 11/03/16  . BPH (benign prostatic hyperplasia)   . CAD (coronary artery disease)   . Depression   . Dislocation of internal right hip prosthesis, sequela   . Dysrhythmia    hx a fib  . Elevated brain natriuretic peptide (BNP) level   . Environmental allergies   . Fracture of wrist 10/23/13   LEFT  . High cholesterol   . History of Graves' disease   . History of kidney stones    surg. removal 02/2017  . History of syncope   . HLD (hyperlipidemia)   . Hypertension   . Hypothyroidism    thyroid removed  . Left knee injury    cap dislocated  . Low testosterone, possible hypogonadism 10/01/2012  . Lumbago   . Metabolic bone disease   . Multiple falls   . Osteoporosis    Severe  . Plaque psoriasis   . Sleep apnea    cpap machine-settings 17  . Thrombocytopenia ([place])   . Thyroid disease    HX GRAVES DISEASE  . Transfusion history    s/p 12'13 hip surgery  . Unsteady gait   . UTI (urinary tract infection)   . Vertebral compression fracture (HCC)    L1-wears brace    Past Surgical  History:  Procedure Laterality Date  . APPENDECTOMY    . CARDIAC CATHETERIZATION  2013  . CATARACT EXTRACTION, BILATERAL    . COLONOSCOPY    . CYSTOSCOPY WITH RETROGRADE PYELOGRAM, URETEROSCOPY AND STENT PLACEMENT Left 11/07/2017   Procedure: CYSTOSCOPY WITH RETROGRADE PYELOGRAM, URETEROSCOPY AND STENT PLACEMENT;  Surgeon: Cleon Gustin, MD;  Location: [facility];  Service: Urology;  Laterality: Left;  . CYSTOSCOPY/URETEROSCOPY/HOLMIUM LASER/STENT PLACEMENT Right 03/11/2017   Procedure: CYSTOSCOPY/URETEROSCOPYSTENT PLACEMENT right ureter retrograde pylegram;  Surgeon: Cleon Gustin, MD;  Location: [facility];  Service: Urology;  Laterality: Right;  . HARDWARE REMOVAL  10/05/2012   Procedure: HARDWARE REMOVAL;  Surgeon: [name], MD;  [facility];  Service: Orthopedics;  Laterality: Right;  REMOVING  STRYKER  GAMMA NAIL  . HARDWARE REMOVAL Right 07/03/2013   Procedure: HARDWARE REMOVAL RIGHT TIBIA ;  Surgeon: Rozanna Box, MD;  Location: [place];  Service: Orthopedics;  Laterality: Right;  . HIP CLOSED REDUCTION Right 10/15/2013   Procedure: CLOSED MANIPULATION HIP;  Surgeon: [name], MD;  [facility];  Service: Orthopedics;  Laterality: Right;  . hip revision     June 2017  . HOLMIUM LASER APPLICATION Right 03/16/2946   Procedure: HOLMIUM LASER APPLICATION;  Surgeon: Cleon Gustin, MD;  [facility];  Service: Urology;  Laterality: Right;  . INCISION AND DRAINAGE HIP Right 11/16/2013   Procedure: IRRIGATION AND DEBRIDEMENT RIGHT HIP;  Surgeon: [name], MD;  [facility];  Service: Orthopedics;  Laterality: Right;  . IR URETERAL STENT LEFT NEW ACCESS W/O SEP NEPHROSTOMY CATH  10/24/2017  . JOINT REPLACEMENT     hip-right x2  . KNEE SURGERY Bilateral 2012   Total knee replacements  . NEPHROLITHOTOMY Right 02/08/2017   Procedure: NEPHROLITHOTOMY PERCUTANEOUS WITH SURGEON ACCESS;  Surgeon: Cleon Gustin, MD;  [facility];  Service: Urology;   Laterality: Right;  . NEPHROLITHOTOMY Left 10/24/2017   Procedure: NEPHROLITHOTOMY PERCUTANEOUS;  Surgeon: Cleon Gustin, MD;  [facility];  Service: Urology;  Laterality: Left;  . ORIF TIBIA FRACTURE Right 02/06/2013   Procedure: OPEN REDUCTION INTERNAL FIXATION (ORIF) TIBIA FRACTURE WITH IM ROD FIBULA;  Surgeon: Rozanna Box, MD;  Location: [place];  Service: Orthopedics;  Laterality: Right;  . ORIF TIBIA FRACTURE Right 07/03/2013   Procedure: RIGHT TIBIA NON UNION REPAIR ;  Surgeon: Rozanna Box, MD;  Location: [place];  Service: Orthopedics;  Laterality: Right;  . ORIF WRIST FRACTURE  10/02/2012   Procedure: OPEN REDUCTION INTERNAL FIXATION (ORIF) WRIST FRACTURE;  Surgeon: Roseanne Kaufman, MD;  [facility];  Service: Orthopedics;  Laterality: Right;  WITH   ANTIBIOTIC  CEMENT  . ORIF WRIST FRACTURE Left 10/28/2013   Procedure: OPEN REDUCTION INTERNAL FIXATION (ORIF) WRIST FRACTURE with allograft;  Surgeon: Roseanne Kaufman, MD;  [facility];  Service: Orthopedics;  Laterality: Left;  DVR Plate  . QUADRICEPS TENDON REPAIR Left 07/15/2017   Procedure: REPAIR QUADRICEP TENDON;  Surgeon: Frederik Pear, MD;  Location: [place];  Service: Orthopedics;  Laterality: Left;  . right femur surgery  05/2012  . THYROIDECTOMY  02/1986  . TOTAL HIP REVISION  10/05/2012   Procedure: TOTAL HIP REVISION;  Surgeon: [name], MD;  [facility];  Service: Orthopedics;  Laterality: Right;  RIGHT TOTAL HIP REVISION  . TOTAL HIP REVISION Right 09/17/2013   Procedure: REVISION RIGHT TOTAL HIP ARTHROPLASTY ;  Surgeon: [name], MD;  [facility];  Service: Orthopedics;  Laterality: Right;  . TOTAL HIP REVISION Right 10/26/2013   Procedure: REVISION RIGHT TOTAL HIP ARTHROPLASTY;  Surgeon: [name], MD;  [facility];  Service: Orthopedics;  Laterality: Right;  . TOTAL HIP REVISION  03/2016  . TOTAL KNEE REVISION Left 04/11/2017   Procedure: TOTAL KNEE REVISION PATELLA and TIBIA;   Surgeon: Frederik Pear, MD;  Location: [place];  Service: Orthopedics;  Laterality: Left;  . WRIST FRACTURE SURGERY  05/2012    Allergies: Short ragweed pollen ext  Medications: Prior to Admission medications   Medication Sig Start Date End Date Taking? Authorizing Provider  alfuzosin (UROXATRAL) 10 MG 24 hr tablet Take 10 mg by mouth at bedtime.   Yes [provider]  amoxicillin (AMOXIL) 500 MG capsule Take 2,000 mg by mouth See admin instructions. Takes 4 capsules 1 hour prior to dental procedures   Yes [provider]  atorvastatin (LIPITOR) 80 MG tablet Take 80 mg by mouth daily.   Yes [provider]  buPROPion (WELLBUTRIN XL) 300 MG 24 hr tablet TAKE 1 TABLET DAILY WITH  BREAKFAST BY MOUTH. Patient taking differently: Take 300 mg by mouth daily.  05/04/18  Yes Plotnikov, Evie Lacks, MD  Calcium Citrate-Vitamin D (CALCIUM CITRATE +  D PO) Take 1 tablet by mouth 2 (two) times daily.    Yes [provider]  Cholecalciferol (VITAMIN D-3 PO) Take 2,000 Units by mouth 2 (two) times daily.    Yes [provider]  clonazePAM (KLONOPIN) 1 MG tablet TAKE 1 TABLET BY MOUTH  EVERY 6 HOURS prn Patient taking differently: Take 1 mg by mouth every 6 (six) hours as needed for anxiety. TAKE 1 TABLET BY MOUTH  EVERY 6 HOURS prn 04/10/18  Yes Plotnikov, Evie Lacks, MD  diltiazem (CARDIZEM CD) 240 MG 24 hr capsule TAKE 1 CAPSULE BY MOUTH  DAILY 06/13/18  Yes Plotnikov, Evie Lacks, MD  doxycycline (VIBRA-TABS) 100 MG tablet Take 1 tablet (100 mg total) by mouth daily. 02/22/18  Yes Plotnikov, Evie Lacks, MD  DULoxetine (CYMBALTA) 60 MG capsule TAKE 1 CAPSULE BY MOUTH  TWICE DAILY 02/20/18  Yes Plotnikov, Evie Lacks, MD  etanercept (ENBREL) 50 MG/ML injection Inject 50 mg into the skin 2 (two) times a week. Tuesday & Friday   Yes [provider]  ferrous sulfate 325 (65 FE) MG tablet Take 325 mg by mouth 2 (two) times daily with a meal.   Yes [provider]    HYDROcodone-acetaminophen (NORCO) 7.5-325 MG tablet Take 1 tablet by mouth 4 (four) times daily. Please fill on 06/03/18 06/01/18  Yes Plotnikov, Evie Lacks, MD  levothyroxine (SYNTHROID, LEVOTHROID) 200 MCG tablet TAKE 1 TABLET DAILY AT 6 AM BY MOUTH. Patient taking differently: Take 225 mcg by mouth daily before breakfast.  05/04/18  Yes Plotnikov, Evie Lacks, MD  levothyroxine (SYNTHROID, LEVOTHROID) 25 MCG tablet TAKE 1 TABLET BY MOUTH  DAILY BEFORE BREAKFAST. Patient taking differently: Take 225 mcg by mouth daily before breakfast.  05/04/18  Yes Plotnikov, Evie Lacks, MD  loratadine (CLARITIN) 10 MG tablet Take 10 mg by mouth daily.    Yes [provider]  mirabegron ER (MYRBETRIQ) 25 MG TB24 tablet Take 25 mg by mouth at bedtime.   Yes [provider]  Multiple Vitamin (MULTIVITAMIN WITH MINERALS) TABS tablet Take 1 tablet by mouth daily. Men's One-A-Day 50+   Yes [provider]  omeprazole (PRILOSEC) 40 MG capsule TAKE 1 CAPSULE DAILY BEFORE BREAKFAST BY MOUTH. Patient taking differently: Take 40 mg by mouth daily.  05/04/18  Yes Plotnikov, Evie Lacks, MD  PROAIR HFA 108 [phone] Base) MCG/ACT inhaler Inhale 1 puff into the lungs every 4 (four) hours as needed. 04/21/18  Yes [provider]  Probiotic Product (PROBIOTIC PO) Take 1 capsule by mouth daily with breakfast.   Yes [provider]  testosterone enanthate (DELATESTRYL) 200 MG/ML injection Inject 1 mL (200 mg total) into the muscle every 14 (fourteen) days. For IM use only- discard vial after 28 days Patient taking differently: Inject 100 mg into the muscle every 14 (fourteen) days. For IM use only- discard vial after 28 days 02/20/18  Yes Plotnikov, Evie Lacks, MD  tiZANidine (ZANAFLEX) 4 MG tablet TAKE 1 TABLET BY MOUTH  EVERY 6 HOURS AS NEEDED FOR MUSCLE SPASM(S) Patient taking differently: Take 4 mg by mouth every 6 (six) hours as needed for muscle spasms.  04/21/18  Yes Plotnikov, Evie Lacks, MD   vitamin C (ASCORBIC ACID) 500 MG tablet Take 500 mg by mouth daily.   Yes [provider]  denosumab (PROLIA) 60 MG/ML SOLN injection Inject 60 mg into the skin every 6 (six) months. Administer in upper arm, thigh, or abdomen    [provider]  EPINEPHrine (EPIPEN  2-PAK) 0.3 mg/0.3 mL IJ SOAJ injection Inject 0.3 mg into the muscle daily as needed (for anaphylactic reaction).    [provider]  HYDROcodone-acetaminophen (NORCO) 7.5-325 MG tablet Take 1 tablet by mouth 4 (four) times daily as needed for moderate pain. Please fill on 07/03/18 06/01/18   Plotnikov, Evie Lacks, MD  HYDROcodone-acetaminophen (NORCO) 7.5-325 MG tablet Take 1 tablet by mouth 4 (four) times daily as needed for moderate pain. Please fill on 08/02/18 06/01/18   Plotnikov, Evie Lacks, MD  ipratropium (ATROVENT) 0.03 % nasal spray Place 1 spray into the nose daily as needed. 04/21/18   [provider]  nitroGLYCERIN (NITROSTAT) 0.4 MG SL tablet Place 0.4 mg under the tongue every 5 (five) minutes as needed for chest pain. x3 doses as needed for chest pain    [provider]  triamcinolone ointment (KENALOG) 0.5 % Apply 1 application topically 2 (two) times daily. To areas of psoriasis as instructed. Patient not taking: Reported on 06/14/2018 06/07/18   Brunetta Genera, MD  XARELTO 10 MG TABS tablet TAKE 1 TABLET DAILY BY  MOUTH. 03/20/18   Plotnikov, Evie Lacks, MD     Family History  Problem Relation Age of Onset  . CAD Father 70  . Asthma Father   . Alcohol abuse Father   . Arthritis Mother   . Alcohol abuse Sister     Social History   Socioeconomic History  . Marital status: Married    Spouse name: Not on file  . Number of children: Not on file  . Years of education: Not on file  . Highest education level: Not on file  Occupational History  . Not on file  Social Needs  . Financial resource strain: Not on file  . Food insecurity:    Worry: Not on file    Inability:  Not on file  . Transportation needs:    Medical: Not on file    Non-medical: Not on file  Tobacco Use  . Smoking status: Former Smoker    Last attempt to quit: 06/05/2012    Years since quitting: 6.0  . Smokeless tobacco: Never Used  Substance and Sexual Activity  . Alcohol use: Yes    Comment: occasional-social  . Drug use: No  . Sexual activity: Yes  Lifestyle  . Physical activity:    Days per week: Not on file    Minutes per session: Not on file  . Stress: Not on file  Relationships  . Social connections:    Talks on phone: Not on file    Gets together: Not on file    Attends religious service: Not on file    Active member of club or organization: Not on file    Attends meetings of clubs or organizations: Not on file    Relationship status: Not on file  Other Topics Concern  . Not on file  Social History Narrative   [place] 2.5 months, went home Feb 21st slipped and fell on back and developed.  Home PT/OT.  Just started outpatient physical therapy.  Friday night, misstepped.       Review of Systems: A 12 point ROS discussed and pertinent positives are indicated in the HPI above.  All other systems are negative.  Review of Systems  Constitutional: Negative for fatigue and fever.  Respiratory: Negative for cough and shortness of breath.   Cardiovascular: Negative for chest pain.  Gastrointestinal: Negative for abdominal pain, diarrhea, nausea and vomiting.  Genitourinary: Negative for dysuria.  Musculoskeletal: Negative for back pain.  Psychiatric/Behavioral: Negative for behavioral problems and confusion.    Vital Signs: BP 133/84   Pulse (!) 102   Temp (!) 97.5 F (36.4 C) (Oral)   SpO2 98%   Physical Exam  Constitutional: He is oriented to person, place, and time. He appears well-developed. No distress.  Neck: Normal range of motion. Neck supple.  Cardiovascular: Exam reveals no gallop and no friction rub.  No murmur heard. Irregular irregular    Pulmonary/Chest: Effort normal and breath sounds normal.  Lymphadenopathy:    He has no cervical adenopathy.  Neurological: He is alert and oriented to person, place, and time.  Skin: Skin is warm and dry. He is not diaphoretic.  Psychiatric: He has a normal mood and affect. His behavior is normal. Judgment and thought content normal.  Nursing note and vitals reviewed.    MD Evaluation Airway: WNL Heart: WNL Abdomen: WNL Chest/ Lungs: WNL ASA  Classification: 3 Mallampati/Airway Score: One   Imaging: Nm Pet Image Initial (pi) Skull Base To Thigh  Result Date: 06/16/2018 CLINICAL DATA:  Initial treatment strategy for B-cell lymphoma. EXAM: NUCLEAR MEDICINE PET SKULL BASE TO THIGH TECHNIQUE: 15.3 mCi F-18 FDG was injected intravenously. Full-ring PET imaging was performed from the skull base to thigh after the radiotracer. CT data was obtained and used for attenuation correction and anatomic localization. Fasting blood glucose: 94 mg/dl COMPARISON:  Multiple exams, including CT abdomen from 03/10/2018 FINDINGS: Mediastinal blood pool activity: SUV max 2.5 Background hepatic parenchymal activity: SUV max 3.4 NECK: Left palatine tonsillar mass 3.1 cm in diameter, maximum SUV 19.8. This may extend to involve the left lingual tonsil/left tongue base. Hypermetabolic right tonsillar activity with maximum SUV 9.8, measuring 1.8 cm in long axis. Left level IIa lymph node measures 1.0 cm in short axis on image 33/4 with maximum SUV 11.7. Left level IIb lymph node measures 1.0 cm in short axis on image 32/4 with maximum SUV 13.0. A presumed lymph node in the subcutaneous tissues superficial to the right lower sternocleidomastoid muscle measures 2.3 cm in short axis on image 41/4 and has a maximum SUV of 2.9. Accentuated activity in the nose is likely incidental. Incidental CT findings: Bilateral common carotid atherosclerotic calcification. CHEST: No significant abnormal hypermetabolic activity in this  region. Incidental CT findings: Coronary, aortic arch, and branch vessel atherosclerotic vascular disease. Paraseptal emphysema. ABDOMEN/PELVIS: No splenomegaly or focal splenic lesion. A right inguinal lymph node measuring 1.4 cm in short axis on image 219/4 has a maximum SUV of 13.3. A left inguinal lymph node measuring 9 mm in short axis on image 220/4 has a maximum SUV of 4.4. Incidental CT findings: Bilateral nonobstructive nephrolithiasis. Aortoiliac atherosclerotic vascular disease. Umbilical hernia contains adipose tissues. SKELETON: There is a right total hip prosthesis with cerclage in somewhat exuberant bone along the proximal stem, in addition to intermediate density along the proximal stem above the bony region with accentuated metabolic activity, maximum SUV in this vicinity 13.4. Strictly speaking I cannot exclude lymphomatous involvement in this region. Incidental CT findings: Old right rib fractures, some of which are nonunited. Several scattered small sclerotic lesions in the skeleton are nonspecific but not appreciably hypermetabolic. 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 IMPRESSION: 1. Deauville 5 hypermetabolic lesions in the palatine tonsils, left internal jugular chain, and involving a right inguinal lymph node. Deauville 4 involvement of a left inguinal lymph node and an enlarged subcutaneous lesion favoring lymph node superficial to the lower right sternocleidomastoid muscle in the neck.  2. Other imaging findings of potential clinical significance: Aortic Atherosclerosis (ICD10-I70.0). Coronary atherosclerosis. Emphysema (ICD10-J43.9). Bilateral nonobstructive nephrolithiasis. Umbilical hernia contains adipose tissues. Old right rib fractures some of which are nonunited. 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 Electronically Signed   By: Van Clines M.D.   On: 06/16/2018 19:33    Labs:  CBC: Recent Labs    10/25/17 0532 02/20/18 1053 06/07/18 1517 06/22/18 0903  WBC 9.8 5.7 6.8 6.9  HGB 13.8 17.7* 16.6  17.3*  HCT 42.7 52.6* 50.0* 53.3*  PLT 166 159.0 173 174    COAGS: Recent Labs    07/12/17 1045 07/15/17 0635 10/24/17 0759 06/22/18 0903  INR 2.09 1.10 1.01 1.02    BMP: Recent Labs    10/24/17 1403 10/25/17 0532 12/29/17 1138 02/20/18 1053 06/07/18 1517  NA 137 136 140 140 138  K 4.2 4.5 4.5 4.3 4.5  CL 105 101 100 102 103  CO2 28 29 24  33* 27  GLUCOSE 130* 130* 102* 94 80  BUN 19 19 19 23  27*  CALCIUM 8.5* 8.7* 8.9 9.2 9.6  CREATININE 1.21 1.22 1.06 1.27 1.32*  GFRNONAA 58* 57* 70  --  52*  GFRAA >60 >60 81  --  >60    LIVER FUNCTION TESTS: Recent Labs    08/16/17 1507 12/29/17 1138 06/07/18 1517  BILITOT 0.6 0.6 1.1  AST 20 30 31   ALT 23 36 38  ALKPHOS 69 63 83  PROT 7.6 7.0 7.7  ALBUMIN 3.9 3.9 3.5    TUMOR MARKERS: No results for input(s): AFPTM, CEA, CA199, CHROMGRNA in the last 8760 hours.  Assessment and Plan: Patient with past medical history of a fib presents with complaint of diffuse large B-cell lymphoma.  IR consulted for Port-A-Cath placement at the request of Dr. Irene Limbo. Case reviewed by Dr. Earleen Newport who approves patient for procedure.  Patient presents today in their usual state of health.  He has been NPO and is not currently on blood thinners.   Risks and benefits of image guided port-a-catheter placement was discussed with the patient including, but not limited to bleeding, infection, pneumothorax, or fibrin sheath development and need for additional procedures.  All of the patient's questions were answered, patient is agreeable to proceed. Consent signed and in chart.   Thank you for this interesting consult.  I greatly enjoyed meeting [name] and look forward to participating in their care.  A copy of this report was sent to the requesting provider on this date.  Electronically Signed: Docia Barrier, PA 06/22/2018, 10:15 AM   I spent a total of  30 Minutes   in face to face in clinical consultation, greater  than 50% of which was counseling/coordinating care for diffuse large B-cell lymphoma.

## 2018-06-22 NOTE — Procedures (Signed)
Interventional Radiology Procedure Note  Procedure: Placement of a right IJ approach single lumen PowerPort.  Tip is positioned at the superior cavoatrial junction and catheter is ready for immediate use.  Per pt request, the port was placed lateral to the chest tattoo.  Complications: None Recommendations:  - Ok to shower tomorrow - Do not submerge for 7 days - Routine line care   Signed,  Dulcy Fanny. Earleen Newport, DO

## 2018-06-22 NOTE — Telephone Encounter (Signed)
Copied from [place] [id number]. Topic: Quick Communication - Rx Refill/Question >> Jun 22, 2018  4:15 PM Gardiner Ramus wrote: Medication: clonazePAM (KLONOPIN) 1 MG tablet [[id number]] pt would just like it sent in so that when its ready to fill they can send it in  Has the patient contacted their pharmacy? Yes Preferred Pharmacy (with phone number or street name):[place], [place] [phone] (Phone) [phone] (Fax)    Agent: Please be advised that RX refills may take up to 3 business days. We ask that you follow-up with your pharmacy.

## 2018-06-22 NOTE — Telephone Encounter (Signed)
Rx refill request: Klonopin 1 mg      Last filled: 04/10/18  Patient may need to know when he needs follow up with control substances Rx. Requesting additional refills.  LOV: 02/20/18  PCP: [email or web address]  Pharmacy: [email or web address]

## 2018-06-22 NOTE — Progress Notes (Signed)
HEMATOLOGY/ONCOLOGY CLINIC NOTE  Date of Service: 06/23/2018  Patient Care Team: Plotnikov, Evie Lacks, MD as PCP - General (Internal Medicine)  CHIEF COMPLAINTS/PURPOSE OF CONSULTATION:  Follow Up for Diffuse Large B-Cell Lymphoma   HISTORY OF PRESENTING ILLNESS:   [name] is a wonderful 72 y.o. male who has been referred to [place] by Dr. Lew Dawes for evaluation and management of Diffuse Large B-Cell Lymphoma. He is accompanied today by his partner. The pt reports that he is doing well overall.   The pt reports that he presented to ENT Dr. Pollie Friar care after developing a cough and a new nodule in his mouth. He notes that the symptoms presented about a month ago and have not progressed or significantly worsened. He notes that he has frequent sinus drainage, and felt that this enlargement could initially be related to this. He denies any changes in his voice and difficulty breathing.   The pt notes that he is finishing three months of PT tomorrow after surgeries on his right leg and left knee. He notes that he takes blood thinners for Afib and uses his CPAP regularly now. The pt also receives testosterone injections, 200mg  every other week. The pt notes that he had several surgeries bilaterally to remove large kidney stones in the past. He notes that his thyroid replacement has been stable. His vertebral compression fracture was in 2013, after a fall in which he also broke his right femur and wrist. The pt also takes Prolia every 6 months for his osteoporosis. He has had 18 surgeries on his right leg.   He notes that his psoriasis has been stable and takes Enbrel twice a week, and has taken this for 5 years. He was on 10 years of Humara before this. He was on Methotrexate prior to [facility].   The pt notes that he functions okay at home and is able to drive himself and cook for himself and feels that he can function independently with limitations on lifting heavier objects.   On  review of systems, pt reports recent cough, left tonsil mass, stable energy levels, and denies fevers, chills, night sweats, unexpected weight loss, difficulty breathing, difficulty swallowing, noticing any other lumps or bumps, pain along the spine, recent infections, abdominal pains, difficulty urinating, leg swelling, and any other symptoms.  . On Social Hx the pt reports that he smoked cigarettes for many years until he quit 6 years ago, and drinks ETOH socially.    INTERVAL HISTORY   [name] is here for a follow up of  Diffuse Large B-Cell Lymphoma and start of treatment. He was last seen by [place] on 06/07/18. He presents to the clinic today accompanied by his partner. He is overall doing well. He notes his port placement yesterday went well.  Recent labs showed normal CBC, Cr at 1.33, BUN at 28, LDH at 214.  He notes he stopped nmbrel and Testosterone since last visit. He plans to see his cardiologist Dr. Oval Linsey soon. He notes he was not expecting the inguinal lymph node to light up on PET scan but is prepared to go through necessary treatment starting today. He asked about side effects and what to expect from treatment and feels prepares to proceed.      MEDICAL HISTORY:  Past Medical History:  Diagnosis Date  . A-fib (HCC)    hx.- sinus rhythm now  . Acid reflux   . AKI (acute kidney injury) (HCC)    mild  .  Anxiety   . Arthritis   . Atrial dilatation, bilateral    Severly, enlargement  per ECHO 11/03/16  . BPH (benign prostatic hyperplasia)   . CAD (coronary artery disease)   . Depression   . Dislocation of internal right hip prosthesis, sequela   . Dysrhythmia    hx a fib  . Elevated brain natriuretic peptide (BNP) level   . Environmental allergies   . Fracture of wrist 10/23/13   LEFT  . High cholesterol   . History of Graves' disease   . History of kidney stones    surg. removal 02/2017  . History of syncope   . HLD (hyperlipidemia)   . Hypertension   .  Hypothyroidism    thyroid removed  . Left knee injury    cap dislocated  . Low testosterone, possible hypogonadism 10/01/2012  . Lumbago   . Metabolic bone disease   . Multiple falls   . Osteoporosis    Severe  . Plaque psoriasis   . Sleep apnea    cpap machine-settings 17  . Thrombocytopenia ([place])   . Thyroid disease    HX GRAVES DISEASE  . Transfusion history    s/p 12'13 hip surgery  . Unsteady gait   . UTI (urinary tract infection)   . Vertebral compression fracture (HCC)    L1-wears brace    SURGICAL HISTORY: Past Surgical History:  Procedure Laterality Date  . APPENDECTOMY    . CARDIAC CATHETERIZATION  2013  . CATARACT EXTRACTION, BILATERAL    . COLONOSCOPY    . CYSTOSCOPY WITH RETROGRADE PYELOGRAM, URETEROSCOPY AND STENT PLACEMENT Left 11/07/2017   Procedure: CYSTOSCOPY WITH RETROGRADE PYELOGRAM, URETEROSCOPY AND STENT PLACEMENT;  Surgeon: Cleon Gustin, MD;  Location: [facility];  Service: Urology;  Laterality: Left;  . CYSTOSCOPY/URETEROSCOPY/HOLMIUM LASER/STENT PLACEMENT Right 03/11/2017   Procedure: CYSTOSCOPY/URETEROSCOPYSTENT PLACEMENT right ureter retrograde pylegram;  Surgeon: Cleon Gustin, MD;  Location: [facility];  Service: Urology;  Laterality: Right;  . HARDWARE REMOVAL  10/05/2012   Procedure: HARDWARE REMOVAL;  Surgeon: [name], MD;  [facility]: [facility];  Service: Orthopedics;  Laterality: Right;  REMOVING  STRYKER  GAMMA NAIL  . HARDWARE REMOVAL Right 07/03/2013   Procedure: HARDWARE REMOVAL RIGHT TIBIA ;  Surgeon: Rozanna Box, MD;  Location: [place];  Service: Orthopedics;  Laterality: Right;  . HIP CLOSED REDUCTION Right 10/15/2013   Procedure: CLOSED MANIPULATION HIP;  Surgeon: [name], MD;  [facility];  Service: Orthopedics;  Laterality: Right;  . hip revision     June 2017  . HOLMIUM LASER APPLICATION Right 12/11/6710   Procedure: HOLMIUM LASER APPLICATION;  Surgeon: Cleon Gustin, MD;  [facility];  Service: Urology;   Laterality: Right;  . INCISION AND DRAINAGE HIP Right 11/16/2013   Procedure: IRRIGATION AND DEBRIDEMENT RIGHT HIP;  Surgeon: [name], MD;  [facility];  Service: Orthopedics;  Laterality: Right;  . IR IMAGING GUIDED PORT INSERTION  06/22/2018  . IR URETERAL STENT LEFT NEW ACCESS W/O SEP NEPHROSTOMY CATH  10/24/2017  . JOINT REPLACEMENT     hip-right x2  . KNEE SURGERY Bilateral 2012   Total knee replacements  . NEPHROLITHOTOMY Right 02/08/2017   Procedure: NEPHROLITHOTOMY PERCUTANEOUS WITH SURGEON ACCESS;  Surgeon: Cleon Gustin, MD;  [facility];  Service: Urology;  Laterality: Right;  . NEPHROLITHOTOMY Left 10/24/2017   Procedure: NEPHROLITHOTOMY PERCUTANEOUS;  Surgeon: Cleon Gustin, MD;  [facility];  Service: Urology;  Laterality:  Left;  . ORIF TIBIA FRACTURE Right 02/06/2013   Procedure: OPEN REDUCTION INTERNAL FIXATION (ORIF) TIBIA FRACTURE WITH IM ROD FIBULA;  Surgeon: Rozanna Box, MD;  Location: [place];  Service: Orthopedics;  Laterality: Right;  . ORIF TIBIA FRACTURE Right 07/03/2013   Procedure: RIGHT TIBIA NON UNION REPAIR ;  Surgeon: Rozanna Box, MD;  Location: [place];  Service: Orthopedics;  Laterality: Right;  . ORIF WRIST FRACTURE  10/02/2012   Procedure: OPEN REDUCTION INTERNAL FIXATION (ORIF) WRIST FRACTURE;  Surgeon: Roseanne Kaufman, MD;  [facility];  Service: Orthopedics;  Laterality: Right;  WITH   ANTIBIOTIC  CEMENT  . ORIF WRIST FRACTURE Left 10/28/2013   Procedure: OPEN REDUCTION INTERNAL FIXATION (ORIF) WRIST FRACTURE with allograft;  Surgeon: Roseanne Kaufman, MD;  [facility];  Service: Orthopedics;  Laterality: Left;  DVR Plate  . QUADRICEPS TENDON REPAIR Left 07/15/2017   Procedure: REPAIR QUADRICEP TENDON;  Surgeon: Frederik Pear, MD;  Location: [place];  Service: Orthopedics;  Laterality: Left;  . right femur surgery  05/2012  . THYROIDECTOMY  02/1986  . TOTAL HIP REVISION  10/05/2012   Procedure: TOTAL HIP REVISION;  Surgeon:  [name], MD;  [facility];  Service: Orthopedics;  Laterality: Right;  RIGHT TOTAL HIP REVISION  . TOTAL HIP REVISION Right 09/17/2013   Procedure: REVISION RIGHT TOTAL HIP ARTHROPLASTY ;  Surgeon: [name], MD;  [facility];  Service: Orthopedics;  Laterality: Right;  . TOTAL HIP REVISION Right 10/26/2013   Procedure: REVISION RIGHT TOTAL HIP ARTHROPLASTY;  Surgeon: [name], MD;  [facility];  Service: Orthopedics;  Laterality: Right;  . TOTAL HIP REVISION  03/2016  . TOTAL KNEE REVISION Left 04/11/2017   Procedure: TOTAL KNEE REVISION PATELLA and TIBIA;  Surgeon: Frederik Pear, MD;  Location: [place];  Service: Orthopedics;  Laterality: Left;  . WRIST FRACTURE SURGERY  05/2012    SOCIAL HISTORY: Social History   Socioeconomic History  . Marital status: Married    Spouse name: Not on file  . Number of children: Not on file  . Years of education: Not on file  . Highest education level: Not on file  Occupational History  . Not on file  Social Needs  . Financial resource strain: Not on file  . Food insecurity:    Worry: Not on file    Inability: Not on file  . Transportation needs:    Medical: Not on file    Non-medical: Not on file  Tobacco Use  . Smoking status: Former Smoker    Last attempt to quit: 06/05/2012    Years since quitting: 6.0  . Smokeless tobacco: Never Used  Substance and Sexual Activity  . Alcohol use: Yes    Comment: occasional-social  . Drug use: No  . Sexual activity: Yes  Lifestyle  . Physical activity:    Days per week: Not on file    Minutes per session: Not on file  . Stress: Not on file  Relationships  . Social connections:    Talks on phone: Not on file    Gets together: Not on file    Attends religious service: Not on file    Active member of club or organization: Not on file    Attends meetings of clubs or organizations: Not on file    Relationship status: Not on file  . Intimate partner violence:    Fear of  current or ex partner: Not on file  Emotionally abused: Not on file    Physically abused: Not on file    Forced sexual activity: Not on file  Other Topics Concern  . Not on file  Social History Narrative   [place] 2.5 months, went home Feb 21st slipped and fell on back and developed.  Home PT/OT.  Just started outpatient physical therapy.  Friday night, misstepped.      FAMILY HISTORY: Family History  Problem Relation Age of Onset  . CAD Father 28  . Asthma Father   . Alcohol abuse Father   . Arthritis Mother   . Alcohol abuse Sister     ALLERGIES:  is allergic to short ragweed pollen ext.  MEDICATIONS:  Current Outpatient Medications  Medication Sig Dispense Refill  . alfuzosin (UROXATRAL) 10 MG 24 hr tablet Take 10 mg by mouth at bedtime.    [name] amoxicillin (AMOXIL) 500 MG capsule Take 2,000 mg by mouth See admin instructions. Takes 4 capsules 1 hour prior to dental procedures    . atorvastatin (LIPITOR) 80 MG tablet Take 80 mg by mouth daily.    [name] buPROPion (WELLBUTRIN XL) 300 MG 24 hr tablet TAKE 1 TABLET DAILY WITH  BREAKFAST BY MOUTH. (Patient taking differently: Take 300 mg by mouth daily. ) 90 tablet 3  . Calcium Citrate-Vitamin D (CALCIUM CITRATE + D PO) Take 1 tablet by mouth 2 (two) times daily.     . Cholecalciferol (VITAMIN D-3 PO) Take 2,000 Units by mouth 2 (two) times daily.     . clonazePAM (KLONOPIN) 1 MG tablet TAKE 1 TABLET BY MOUTH  EVERY 6 HOURS prn (Patient taking differently: Take 1 mg by mouth every 6 (six) hours as needed for anxiety. TAKE 1 TABLET BY MOUTH  EVERY 6 HOURS prn) 360 tablet 0  . denosumab (PROLIA) 60 MG/ML SOLN injection Inject 60 mg into the skin every 6 (six) months. Administer in upper arm, thigh, or abdomen    . diltiazem (CARDIZEM CD) 240 MG 24 hr capsule TAKE 1 CAPSULE BY MOUTH  DAILY 90 capsule 3  . doxycycline (VIBRA-TABS) 100 MG tablet Take 1 tablet (100 mg total) by mouth daily. 90 tablet 1  . DULoxetine (CYMBALTA) 60 MG capsule TAKE  1 CAPSULE BY MOUTH  TWICE DAILY 180 capsule 1  . EPINEPHrine (EPIPEN 2-PAK) 0.3 mg/0.3 mL IJ SOAJ injection Inject 0.3 mg into the muscle daily as needed (for anaphylactic reaction).    [name] etanercept (ENBREL) 50 MG/ML injection Inject 50 mg into the skin 2 (two) times a week. Tuesday & Friday    . ferrous sulfate 325 (65 FE) MG tablet Take 325 mg by mouth 2 (two) times daily with a meal.    . HYDROcodone-acetaminophen (NORCO) 7.5-325 MG tablet Take 1 tablet by mouth 4 (four) times daily. Please fill on 06/03/18 120 tablet 0  . HYDROcodone-acetaminophen (NORCO) 7.5-325 MG tablet Take 1 tablet by mouth 4 (four) times daily as needed for moderate pain. Please fill on 07/03/18 120 tablet 0  . HYDROcodone-acetaminophen (NORCO) 7.5-325 MG tablet Take 1 tablet by mouth 4 (four) times daily as needed for moderate pain. Please fill on 08/02/18 120 tablet 0  . ipratropium (ATROVENT) 0.03 % nasal spray Place 1 spray into the nose daily as needed.    [name] levothyroxine (SYNTHROID, LEVOTHROID) 200 MCG tablet TAKE 1 TABLET DAILY AT 6 AM BY MOUTH. (Patient taking differently: Take 225 mcg by mouth daily before breakfast. ) 90 tablet 3  . levothyroxine (SYNTHROID, LEVOTHROID) 25 MCG tablet  TAKE 1 TABLET BY MOUTH  DAILY BEFORE BREAKFAST. (Patient taking differently: Take 225 mcg by mouth daily before breakfast. ) 90 tablet 3  . loratadine (CLARITIN) 10 MG tablet Take 10 mg by mouth daily.     . mirabegron ER (MYRBETRIQ) 25 MG TB24 tablet Take 25 mg by mouth at bedtime.    . Multiple Vitamin (MULTIVITAMIN WITH MINERALS) TABS tablet Take 1 tablet by mouth daily. Men's One-A-Day 93+    . nitroGLYCERIN (NITROSTAT) 0.4 MG SL tablet Place 0.4 mg under the tongue every 5 (five) minutes as needed for chest pain. x3 doses as needed for chest pain    . omeprazole (PRILOSEC) 40 MG capsule TAKE 1 CAPSULE DAILY BEFORE BREAKFAST BY MOUTH. (Patient taking differently: Take 40 mg by mouth daily. ) 90 capsule 3  . PROAIR HFA 108 (90 Base)  MCG/ACT inhaler Inhale 1 puff into the lungs every 4 (four) hours as needed.    . Probiotic Product (PROBIOTIC PO) Take 1 capsule by mouth daily with breakfast.    . testosterone enanthate (DELATESTRYL) 200 MG/ML injection Inject 1 mL (200 mg total) into the muscle every 14 (fourteen) days. For IM use only- discard vial after 28 days (Patient taking differently: Inject 100 mg into the muscle every 14 (fourteen) days. For IM use only- discard vial after 28 days) 5 mL 5  . tiZANidine (ZANAFLEX) 4 MG tablet TAKE 1 TABLET BY MOUTH  EVERY 6 HOURS AS NEEDED FOR MUSCLE SPASM(S) (Patient taking differently: Take 4 mg by mouth every 6 (six) hours as needed for muscle spasms. ) 360 tablet 1  . triamcinolone ointment (KENALOG) 0.5 % Apply 1 application topically 2 (two) times daily. To areas of psoriasis as instructed. (Patient not taking: Reported on 06/14/2018) 30 g 1  . vitamin C (ASCORBIC ACID) 500 MG tablet Take 500 mg by mouth daily.    Alveda Reasons 10 MG TABS tablet TAKE 1 TABLET DAILY BY  MOUTH. 90 tablet 3   No current facility-administered medications for this visit.     REVIEW OF SYSTEMS:    10 Point review of Systems was done is negative except as noted above.  PHYSICAL EXAMINATION: ECOG PERFORMANCE STATUS: 2 - Symptomatic, <50% confined to bed  . Vitals:   06/23/18 0842  BP: 133/63  Pulse: 91  Resp: (!) 22  Temp: 97.7 F (36.5 C)  SpO2: 97%   Filed Weights   06/23/18 0842  Weight: (!) 300 lb 11.2 oz (136.4 kg)   .Body mass index is 37.58 kg/m.  GENERAL:alert, in no acute distress and comfortable SKIN: chronic venous stasis changes in bilateral lower extremities, psoriasis rashes on elbows EYES: conjunctiva are pink and non-injected, sclera anicteric OROPHARYNX: enlarged left tonsil with erythema extending up to midline in the posterior cavity NECK: supple, no JVD LYMPH: no palpable lymphadenopathy in axillary or inguinal regions (+) significant tonsillar swelling on left side,  extending halfway to midline (+) a couple of palpable enlarged cervical lymph node b/l LUNGS: clear to auscultation b/l with normal respiratory effort HEART: regular rate & rhythm ABDOMEN:  normoactive bowel sounds , non tender, not distended. Extremity: no pedal edema PSYCH: alert & oriented x 3 with fluent speech NEURO: no focal motor/sensory deficits  LABORATORY DATA:  I have reviewed the data as listed  . CBC Latest Ref Rng & Units 06/23/2018 06/22/2018 06/07/2018  WBC 4.0 - 10.3 K/uL 6.0 6.9 6.8  Hemoglobin 13.0 - 17.1 g/dL 15.7 17.3(H) 16.6  Hematocrit 38.4 - 49.9 %  47.5 53.3(H) 50.0(H)  Platelets 140 - 400 K/uL 143 174 173    . CMP Latest Ref Rng & Units 06/23/2018 06/22/2018 06/07/2018  Glucose 70 - 99 mg/dL 107(H) 128(H) 80  BUN 8 - 23 mg/dL 28(H) 25(H) 27(H)  Creatinine 0.61 - 1.24 mg/dL 1.33(H) 1.32(H) 1.32(H)  Sodium 135 - 145 mmol/L 139 137 138  Potassium 3.5 - 5.1 mmol/L 4.6 4.2 4.5  Chloride 98 - 111 mmol/L 103 102 103  CO2 22 - 32 mmol/L 27 24 27   Calcium 8.9 - 10.3 mg/dL 9.2 9.5 9.6  Total Protein 6.5 - 8.1 g/dL 7.4 - 7.7  Total Bilirubin 0.3 - 1.2 mg/dL 0.9 - 1.1  Alkaline Phos 38 - 126 U/L 75 - 83  AST 15 - 41 U/L 24 - 31  ALT 0 - 44 U/L 28 - 38   05/29/18 Left tonsil biopsy:   RADIOGRAPHIC STUDIES: I have personally reviewed the radiological images as listed and agreed with the findings in the report. Nm Pet Image Initial (pi) Skull Base To Thigh  Result Date: 06/16/2018 CLINICAL DATA:  Initial treatment strategy for B-cell lymphoma. EXAM: NUCLEAR MEDICINE PET SKULL BASE TO THIGH TECHNIQUE: 15.3 mCi F-18 FDG was injected intravenously. Full-ring PET imaging was performed from the skull base to thigh after the radiotracer. CT data was obtained and used for attenuation correction and anatomic localization. Fasting blood glucose: 94 mg/dl COMPARISON:  Multiple exams, including CT abdomen from 03/10/2018 FINDINGS: Mediastinal blood pool activity: SUV max 2.5  Background hepatic parenchymal activity: SUV max 3.4 NECK: Left palatine tonsillar mass 3.1 cm in diameter, maximum SUV 19.8. This may extend to involve the left lingual tonsil/left tongue base. Hypermetabolic right tonsillar activity with maximum SUV 9.8, measuring 1.8 cm in long axis. Left level IIa lymph node measures 1.0 cm in short axis on image 33/4 with maximum SUV 11.7. Left level IIb lymph node measures 1.0 cm in short axis on image 32/4 with maximum SUV 13.0. A presumed lymph node in the subcutaneous tissues superficial to the right lower sternocleidomastoid muscle measures 2.3 cm in short axis on image 41/4 and has a maximum SUV of 2.9. Accentuated activity in the nose is likely incidental. Incidental CT findings: Bilateral common carotid atherosclerotic calcification. CHEST: No significant abnormal hypermetabolic activity in this region. Incidental CT findings: Coronary, aortic arch, and branch vessel atherosclerotic vascular disease. Paraseptal emphysema. ABDOMEN/PELVIS: No splenomegaly or focal splenic lesion. A right inguinal lymph node measuring 1.4 cm in short axis on image 219/4 has a maximum SUV of 13.3. A left inguinal lymph node measuring 9 mm in short axis on image 220/4 has a maximum SUV of 4.4. Incidental CT findings: Bilateral nonobstructive nephrolithiasis. Aortoiliac atherosclerotic vascular disease. Umbilical hernia contains adipose tissues. SKELETON: There is a right total hip prosthesis with cerclage in somewhat exuberant bone along the proximal stem, in addition to intermediate density along the proximal stem above the bony region with accentuated metabolic activity, maximum SUV in this vicinity 13.4. Strictly speaking I cannot exclude lymphomatous involvement in this region. Incidental CT findings: Old right rib fractures, some of which are nonunited. Several scattered small sclerotic lesions in the skeleton are nonspecific but not appreciably hypermetabolic. 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  IMPRESSION: 1. Deauville 5 hypermetabolic lesions in the palatine tonsils, left internal jugular chain, and involving a right inguinal lymph node. Deauville 4 involvement of a left inguinal lymph node and an enlarged subcutaneous lesion favoring lymph node superficial to the lower right sternocleidomastoid muscle in the  neck. 2. Other imaging findings of potential clinical significance: Aortic Atherosclerosis (ICD10-I70.0). Coronary atherosclerosis. Emphysema (ICD10-J43.9). Bilateral nonobstructive nephrolithiasis. Umbilical hernia contains adipose tissues. Old right rib fractures some of which are nonunited. 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 Electronically Signed   By: Van Clines M.D.   On: 06/16/2018 19:33   Ir Imaging Guided Port Insertion  Result Date: 06/22/2018 INDICATION: 72 year old male with a history large B-cell lymphoma EXAM: IMPLANTED PORT A CATH PLACEMENT WITH ULTRASOUND AND FLUOROSCOPIC GUIDANCE MEDICATIONS: 2.0 g Ancef; The antibiotic was administered within an appropriate time interval prior to skin puncture. ANESTHESIA/SEDATION: Moderate (conscious) sedation was employed during this procedure. A total of Versed 1.5 mg and Fentanyl 75 mcg was administered intravenously. Moderate Sedation Time: 5 20 minutes. The patient's level of consciousness and vital signs were monitored continuously by radiology nursing throughout the procedure under my direct supervision. FLUOROSCOPY TIME:  0 minutes, 18  seconds (1.0 mGy) COMPLICATIONS: None PROCEDURE: The procedure, risks, benefits, and alternatives were explained to the patient. Questions regarding the procedure were encouraged and answered. The patient understands and consents to the procedure. Ultrasound survey was performed with images stored and sent to PACs. The right neck and chest was prepped with chlorhexidine, and draped in the usual sterile fashion using maximum barrier technique (cap and mask, sterile gown, sterile gloves, large sterile sheet,  hand hygiene and cutaneous antiseptic). Antibiotic prophylaxis was provided with 2.0g Ancef administered IV one hour prior to skin incision. Local anesthesia was attained by infiltration with 1% lidocaine without epinephrine. Ultrasound demonstrated patency of the right internal jugular vein, and this was documented with an image. Under real-time ultrasound guidance, this vein was accessed with a 21 gauge micropuncture needle and image documentation was performed. A small dermatotomy was made at the access site with an 11 scalpel. A 0.018" wire was advanced into the SVC and used to estimate the length of the internal catheter. The access needle exchanged for a 61F micropuncture vascular sheath. The 0.018" wire was then removed and a 0.035" wire advanced into the IVC. An appropriate location for the subcutaneous reservoir was selected. Patient requested a location of the port apparatus lateral to and existing tattoo on the right chest. After local anesthesia, incision was made. The subcutaneous tissues were then dissected using a combination of blunt and sharp surgical technique and a pocket was formed. A single lumen power injectable portacatheter was then tunneled through the subcutaneous tissues from the pocket to the dermatotomy and the port reservoir placed within the subcutaneous pocket. The venous access site was then serially dilated and a peel away vascular sheath placed over the wire. The wire was removed and the port catheter advanced into position under fluoroscopic guidance. The catheter tip is positioned in the cavoatrial junction. This was documented with a spot image. The portacatheter was then tested and found to flush and aspirate well. The port was flushed with saline followed by 100 units/mL heparinized saline. The pocket was then closed in two layers using first subdermal inverted interrupted absorbable sutures followed by a running subcuticular suture. The epidermis was then sealed with  Dermabond. The dermatotomy at the venous access site was also seal with Dermabond. Patient tolerated the procedure well and remained hemodynamically stable throughout. No complications encountered and no significant blood loss encountered IMPRESSION: Status post right IJ port catheter. Signed, Dulcy Fanny. Dellia Nims, RPVI [facility] Electronically Signed   By: Corrie Mckusick D.O.   On: 06/22/2018 13:19  ASSESSMENT & PLAN:  72 y.o. male with  1. Diffuse Large B-Cell Lymphoma, Left tonsil -05/29/18 Left tonsil biopsy which revealed Diffuse Large B-Cell Lymphoma, which requires treatment  -06/07/18 LDH at 214, will monitor change through treatment.  -Baseline ECHO 06/14/18: EF 50-55% -Standard regimen of R-CHOP with G-CSF support for 6 cycles starting 06/23/18   PLAN  -I viewed and discussed PET from 06/16/18 which shows 1. Deauville 5 hypermetabolic lesions in the palatine tonsils, left internal jugular chain, and involving a right inguinal lymph node. Deau[place] 4 involvement of a left inguinal lymph node and an enlarged subcutaneous lesion favoring lymph node superficial to the lower right sternocleidomastoid muscle in the neck. 2. Other imaging findings of potential clinical significance: Aortic Atherosclerosis. Coronary atherosclerosis. Emphysema. Bilateral nonobstructive nephrolithiasis. Umbilical hernia contains adipose tissues. Old right rib fractures some of which are nonunited. 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 -Given his staging PET shows suspicion for lymphoma in inguinal lymph node, this would make his cancer stage III. - I discussed we can get definitive staging through inguinal lymph node biopsy or we can plan to treat for the stage III disease with 6 cycles of R-CHOP. Pt is okay with the plan for 6 cycles of treatment. - I explained his possible prognosis,  treatment regimen and potential side effects in great detail.  -As this treatment can  reduce his blood counts, will add G-CSF growth factor injections to treatment.  -Baseline ECHO adequate with EF 50-55%, repeat every 3 cycles, follow up with cardiologist Dr. Skeet Latch. -Port a cath placed on 06/22/18 and completed Chemo education class -Pt has started holding Embrel and testosterone since last visit. I previously prescribed Kenalog for topical use for now. If needed he may restart Testosterone at low dose. Will monitor.  -I reviewed labs with pt, (06/23/18), CBC WNL, Cr at 1.33, BUN at 28, Albumin 3.2. I encouraged him to drink more water as he may be dehydrated.  -The latest LDH from 06/07/18 at 214. Will monitor during treatment for evidence of response.  -I called in supportive medications, antiemetics, to use as needed. I briefly reviewed the resources available that we can provide. -He will start R-CHOP with G-CSF support today  -Recommended that the pt focus on staying hydrated, walking, and eating well while on treatment -Recommend that the pt receive annual flu vaccination -F/u in the next 10 days for toxicity check    2.  Patient Active Problem List   Diagnosis Date Noted  . Diffuse large B-cell lymphoma ([place]) 06/23/2018  . Port-A-Cath in place 06/23/2018  . Hypogonadism in male 11/22/2017  . Nephrolithiasis 10/24/2017  . Recurrent dislocation of left patella 07/15/2017  . Recurrent subluxation of patella, left 07/08/2017  . S/P revision of total knee 04/11/2017  . Failed total knee, left, initial encounter ([place]) 04/09/2017  . Renal calculus, right 02/08/2017  . Protein-calorie malnutrition, severe ([place]) 12/08/2016  . Chronic obstructive pulmonary disease ([place]) 12/08/2016  . Staghorn renal calculus 11/05/2016  . Wound drainage right hip 11/15/2013  . Expected blood loss anemia 09/18/2013  . Hyponatremia 09/18/2013  . S/P right TH revision 09/17/2013  . Nonunion, fracture, Right tibia  07/03/2013  . UTI (urinary tract infection) 07/03/2013  .  Pathological fracture due to osteoporosis with delayed healing 04/24/2013  . Osteoporosis with fracture 04/24/2013  . Rash and nonspecific skin eruption 03/08/2013  . Syncope 02/21/2013  . PAF (paroxysmal atrial fibrillation) ([place]) 02/21/2013  . Chronic anticoagulation 02/21/2013  . Fall 02/08/2013  . Physical deconditioning  02/08/2013  . Low back pain radiating to both legs 02/08/2013  . Compression fracture of L1 lumbar vertebra ([facility]) 02/08/2013  . Right tibial fracture 02/05/2013  . Obesity, unspecified 02/05/2013  . Osteomyelitis of right wrist ([place]) 11/02/2012  . Anemia 10/06/2012  . HTN (hypertension) 10/03/2012  . Psoriasis 10/03/2012  . Dyslipidemia 10/03/2012  . Vitamin D deficiency 10/03/2012  . GERD (gastroesophageal reflux disease) 10/03/2012  . Hyperglycemia 10/03/2012  . Anxiety 10/03/2012  . Depression 10/03/2012  . CAD (coronary artery disease) 10/03/2012  . DJD (degenerative joint disease) 10/03/2012  . Chronic gingivitis 10/03/2012  . Low testosterone, possible hypogonadism 10/01/2012  . Normocytic anemia 09/30/2012  . Right femoral fracture ([place]) 09/29/2012  . Right forearm fracture 09/29/2012  . Atrial fibrillation ([place]) 09/29/2012  . Hypothyroidism 09/29/2012  . History of recurrent UTIs 09/29/2012   -continue mx of medical co-morbidities with PCP    Labs reviewed and adequate to proceed with start of R-CHOP today  Please schedule for neulasta on Monday 9/16 RTC with Dr Irene Limbo with labs in 10 days for toxicity check Please schedule C2 of R-CHOP with neulasta in 3 weeks with labs/MD visit     All of the patients questions were answered with apparent satisfaction. The patient knows to call the clinic with any problems, questions or concerns.  The total time spent in the appt was 40 minutes and more than 50% was on counseling and direct patient cares.    Sullivan Lone MD MS AAHIVMS [facility] Hematology/Oncology Physician [facility]  (Office):       [phone] (Work cell):  [phone] (Fax):           [phone]  06/23/2018 9:13 AM  I, Joslyn Devon, am acting as scribe for Sullivan Lone, MD.    .I have reviewed the above documentation for accuracy and completeness, and I agree with the above.   Brunetta Genera MD

## 2018-06-23 ENCOUNTER — Inpatient Hospital Stay: Payer: Medicare Other

## 2018-06-23 ENCOUNTER — Inpatient Hospital Stay (HOSPITAL_BASED_OUTPATIENT_CLINIC_OR_DEPARTMENT_OTHER): Payer: Medicare Other | Admitting: Hematology

## 2018-06-23 ENCOUNTER — Encounter: Payer: Self-pay | Admitting: Hematology

## 2018-06-23 VITALS — BP 159/63 | HR 100 | Temp 98.4°F | Resp 19

## 2018-06-23 VITALS — BP 133/63 | HR 91 | Temp 97.7°F | Resp 22 | Ht 75.0 in | Wt 300.7 lb

## 2018-06-23 DIAGNOSIS — C8339 Diffuse large B-cell lymphoma, extranodal and solid organ sites: Secondary | ICD-10-CM

## 2018-06-23 DIAGNOSIS — Z95828 Presence of other vascular implants and grafts: Secondary | ICD-10-CM | POA: Insufficient documentation

## 2018-06-23 DIAGNOSIS — Z5112 Encounter for antineoplastic immunotherapy: Secondary | ICD-10-CM | POA: Diagnosis not present

## 2018-06-23 DIAGNOSIS — C8330 Diffuse large B-cell lymphoma, unspecified site: Secondary | ICD-10-CM

## 2018-06-23 DIAGNOSIS — C8331 Diffuse large B-cell lymphoma, lymph nodes of head, face, and neck: Secondary | ICD-10-CM

## 2018-06-23 DIAGNOSIS — Z5111 Encounter for antineoplastic chemotherapy: Secondary | ICD-10-CM | POA: Diagnosis present

## 2018-06-23 DIAGNOSIS — Z5189 Encounter for other specified aftercare: Secondary | ICD-10-CM | POA: Diagnosis not present

## 2018-06-23 DIAGNOSIS — F419 Anxiety disorder, unspecified: Secondary | ICD-10-CM

## 2018-06-23 LAB — CBC WITH DIFFERENTIAL/PLATELET
BASOS ABS: 0 10*3/uL (ref 0.0–0.1)
BASOS PCT: 0 %
EOS ABS: 0.3 10*3/uL (ref 0.0–0.5)
Eosinophils Relative: 5 %
HCT: 47.5 % (ref 38.4–49.9)
HEMOGLOBIN: 15.7 g/dL (ref 13.0–17.1)
LYMPHS ABS: 1.8 10*3/uL (ref 0.9–3.3)
Lymphocytes Relative: 29 %
MCH: 30.6 pg (ref 27.2–33.4)
MCHC: 33.1 g/dL (ref 32.0–36.0)
MCV: 92.6 fL (ref 79.3–98.0)
MONO ABS: 0.6 10*3/uL (ref 0.1–0.9)
MONOS PCT: 9 %
Neutro Abs: 3.4 10*3/uL (ref 1.5–6.5)
Neutrophils Relative %: 57 %
Platelets: 143 10*3/uL (ref 140–400)
RBC: 5.13 MIL/uL (ref 4.20–5.82)
RDW: 14.4 % (ref 11.0–14.6)
WBC: 6 10*3/uL (ref 4.0–10.3)

## 2018-06-23 LAB — CMP (CANCER CENTER ONLY)
ALT: 28 U/L (ref 0–44)
AST: 24 U/L (ref 15–41)
Albumin: 3.2 g/dL — ABNORMAL LOW (ref 3.5–5.0)
Alkaline Phosphatase: 75 U/L (ref 38–126)
Anion gap: 9 (ref 5–15)
BUN: 28 mg/dL — AB (ref 8–23)
CO2: 27 mmol/L (ref 22–32)
CREATININE: 1.33 mg/dL — AB (ref 0.61–1.24)
Calcium: 9.2 mg/dL (ref 8.9–10.3)
Chloride: 103 mmol/L (ref 98–111)
GFR, EST NON AFRICAN AMERICAN: 52 mL/min — AB (ref >60)
GFR, Est AFR Am: >60 mL/min (ref >60)
Glucose, Bld: 107 mg/dL — ABNORMAL HIGH (ref 70–99)
Potassium: 4.6 mmol/L (ref 3.5–5.1)
SODIUM: 139 mmol/L (ref 135–145)
Total Bilirubin: 0.9 mg/dL (ref 0.3–1.2)
Total Protein: 7.4 g/dL (ref 6.5–8.1)

## 2018-06-23 MED ORDER — DIPHENHYDRAMINE HCL 25 MG PO CAPS
ORAL_CAPSULE | ORAL | Status: AC
  Filled 2018-06-23: qty 2

## 2018-06-23 MED ORDER — LIDOCAINE-PRILOCAINE 2.5-2.5 % EX CREA: TOPICAL_CREAM | CUTANEOUS | 3 refills | Status: DC

## 2018-06-23 MED ORDER — ONDANSETRON HCL 8 MG PO TABS: 8.0000 mg | ORAL_TABLET | Freq: Two times a day (BID) | ORAL | 1 refills | Status: DC | PRN

## 2018-06-23 MED ORDER — HEPARIN SOD (PORK) LOCK FLUSH 100 UNIT/ML IV SOLN
500.0000 [IU] | Freq: Once | INTRAVENOUS | Status: AC | PRN
  Administered 2018-06-23: 500 [IU]
  Filled 2018-06-23: qty 5

## 2018-06-23 MED ORDER — PROCHLORPERAZINE MALEATE 10 MG PO TABS: 10.0000 mg | ORAL_TABLET | Freq: Four times a day (QID) | ORAL | 6 refills | Status: DC | PRN

## 2018-06-23 MED ORDER — SODIUM CHLORIDE 0.9 % IV SOLN
745.0000 mg/m2 | Freq: Once | INTRAVENOUS | Status: AC
  Administered 2018-06-23: 2000 mg via INTRAVENOUS
  Filled 2018-06-23: qty 100

## 2018-06-23 MED ORDER — DIPHENHYDRAMINE HCL 25 MG PO CAPS
50.0000 mg | ORAL_CAPSULE | Freq: Once | ORAL | Status: AC
  Administered 2018-06-23: 50 mg via ORAL

## 2018-06-23 MED ORDER — ACETAMINOPHEN 325 MG PO TABS
ORAL_TABLET | ORAL | Status: AC
  Filled 2018-06-23: qty 2

## 2018-06-23 MED ORDER — PREDNISONE 20 MG PO TABS: 60.0000 mg | ORAL_TABLET | Freq: Every day | ORAL | 6 refills | Status: DC

## 2018-06-23 MED ORDER — ACETAMINOPHEN 325 MG PO TABS
650.0000 mg | ORAL_TABLET | Freq: Once | ORAL | Status: AC
  Administered 2018-06-23: 650 mg via ORAL

## 2018-06-23 MED ORDER — LORAZEPAM 1 MG PO TABS
ORAL_TABLET | ORAL | Status: AC
  Filled 2018-06-23: qty 1

## 2018-06-23 MED ORDER — LORAZEPAM 1 MG PO TABS
0.5000 mg | ORAL_TABLET | Freq: Once | ORAL | Status: AC
  Administered 2018-06-23: 0.5 mg via ORAL

## 2018-06-23 MED ORDER — DEXAMETHASONE SODIUM PHOSPHATE 10 MG/ML IJ SOLN
10.0000 mg | Freq: Once | INTRAMUSCULAR | Status: AC
  Administered 2018-06-23: 10 mg via INTRAVENOUS

## 2018-06-23 MED ORDER — LORAZEPAM 0.5 MG PO TABS: 0.5000 mg | ORAL_TABLET | Freq: Four times a day (QID) | ORAL | 0 refills | Status: DC | PRN

## 2018-06-23 MED ORDER — DOXORUBICIN HCL CHEMO IV INJECTION 2 MG/ML
50.0000 mg/m2 | Freq: Once | INTRAVENOUS | Status: AC
  Administered 2018-06-23: 134 mg via INTRAVENOUS
  Filled 2018-06-23: qty 67

## 2018-06-23 MED ORDER — SODIUM CHLORIDE 0.9 % IV SOLN: 10.0000 mg | Freq: Once | INTRAVENOUS | Status: DC

## 2018-06-23 MED ORDER — PALONOSETRON HCL INJECTION 0.25 MG/5ML
INTRAVENOUS | Status: AC
  Filled 2018-06-23: qty 5

## 2018-06-23 MED ORDER — DEXAMETHASONE SODIUM PHOSPHATE 10 MG/ML IJ SOLN
INTRAMUSCULAR | Status: AC
  Filled 2018-06-23: qty 1

## 2018-06-23 MED ORDER — SODIUM CHLORIDE 0.9 % IV SOLN
375.0000 mg/m2 | Freq: Once | INTRAVENOUS | Status: AC
  Administered 2018-06-23: 1000 mg via INTRAVENOUS
  Filled 2018-06-23: qty 100

## 2018-06-23 MED ORDER — VINCRISTINE SULFATE CHEMO INJECTION 1 MG/ML
2.0000 mg | Freq: Once | INTRAVENOUS | Status: AC
  Administered 2018-06-23: 2 mg via INTRAVENOUS
  Filled 2018-06-23: qty 2

## 2018-06-23 MED ORDER — SODIUM CHLORIDE 0.9% FLUSH
10.0000 mL | Freq: Once | INTRAVENOUS | Status: AC
  Administered 2018-06-23: 10 mL
  Filled 2018-06-23: qty 10

## 2018-06-23 MED ORDER — PALONOSETRON HCL INJECTION 0.25 MG/5ML
0.2500 mg | Freq: Once | INTRAVENOUS | Status: AC
  Administered 2018-06-23: 0.25 mg via INTRAVENOUS

## 2018-06-23 MED ORDER — SODIUM CHLORIDE 0.9 % IV SOLN
Freq: Once | INTRAVENOUS | Status: AC
  Administered 2018-06-23: 10:00:00 via INTRAVENOUS
  Filled 2018-06-23: qty 250

## 2018-06-23 MED ORDER — SODIUM CHLORIDE 0.9% FLUSH
10.0000 mL | INTRAVENOUS | Status: DC | PRN
  Administered 2018-06-23: 10 mL
  Filled 2018-06-23: qty 10

## 2018-06-23 NOTE — Progress Notes (Signed)
START ON PATHWAY REGIMEN - Lymphoma and CLL     A cycle is every 21 days:     Prednisone      Rituximab      Cyclophosphamide      Doxorubicin      Vincristine   **Always confirm dose/schedule in your pharmacy ordering system**  Patient Characteristics: Diffuse Large B-Cell Lymphoma, First Line, Stage III and IV Disease Type: Not Applicable Disease Type: Diffuse Large B-Cell Lymphoma Disease Type: Not Applicable Line of therapy: First Line [place] Stage: III Intent of Therapy: Curative Intent, Discussed with Patient 

## 2018-06-23 NOTE — Patient Instructions (Signed)
[facility] Discharge Instructions for Patients Receiving Chemotherapy  Today you received the following chemotherapy agents Adriamycin, Vincristine, Cytoxan and Rituxan   To help prevent nausea and vomiting after your treatment, we encourage you to take your nausea medication as directed. No Zofran for three days, take Compazine instead.    If you develop nausea and vomiting that is not controlled by your nausea medication, call the clinic.   BELOW ARE SYMPTOMS THAT SHOULD BE REPORTED IMMEDIATELY:  *FEVER GREATER THAN 100.5 F  *CHILLS WITH OR WITHOUT FEVER  NAUSEA AND VOMITING THAT IS NOT CONTROLLED WITH YOUR NAUSEA MEDICATION  *UNUSUAL SHORTNESS OF BREATH  *UNUSUAL BRUISING OR BLEEDING  TENDERNESS IN MOUTH AND THROAT WITH OR WITHOUT PRESENCE OF ULCERS  *URINARY PROBLEMS  *BOWEL PROBLEMS  UNUSUAL RASH Items with * indicate a potential emergency and should be followed up as soon as possible.  Feel free to call the clinic should you have any questions or concerns. The clinic phone number is [phone].  Please show the [facility] at check-in to the Emergency Department and triage nurse.  Doxorubicin injection What is this medicine? DOXORUBICIN (dox oh ROO bi sin) is a chemotherapy drug. It is used to treat many kinds of cancer like leukemia, lymphoma, neuroblastoma, sarcoma, and Wilms' tumor. It is also used to treat bladder cancer, breast cancer, lung cancer, ovarian cancer, stomach cancer, and thyroid cancer. This medicine may be used for other purposes; ask your health care provider or pharmacist if you have questions. COMMON BRAND NAME(S): Adriamycin, Adriamycin PFS, Adriamycin RDF, Rubex What should I tell my health care provider before I take this medicine? They need to know if you have any of these conditions: -heart disease -history of low blood counts caused by a medicine -liver disease -recent or ongoing radiation therapy -an unusual or  allergic reaction to doxorubicin, other chemotherapy agents, other medicines, foods, dyes, or preservatives -pregnant or trying to get pregnant -breast-feeding How should I use this medicine? This drug is given as an infusion into a vein. It is administered in a hospital or clinic by a specially trained health care professional. If you have pain, swelling, burning or any unusual feeling around the site of your injection, tell your health care professional right away. Talk to your pediatrician regarding the use of this medicine in children. Special care may be needed. Overdosage: If you think you have taken too much of this medicine contact a poison control center or emergency room at once. NOTE: This medicine is only for you. Do not share this medicine with others. What if I miss a dose? It is important not to miss your dose. Call your doctor or health care professional if you are unable to keep an appointment. What may interact with this medicine? This medicine may interact with the following medications: -6-mercaptopurine -paclitaxel -phenytoin -St. John's Wort -trastuzumab -verapamil This list may not describe all possible interactions. Give your health care provider a list of all the medicines, herbs, non-prescription drugs, or dietary supplements you use. Also tell them if you smoke, drink alcohol, or use illegal drugs. Some items may interact with your medicine. What should I watch for while using this medicine? This drug may make you feel generally unwell. This is not uncommon, as chemotherapy can affect healthy cells as well as cancer cells. Report any side effects. Continue your course of treatment even though you feel ill unless your doctor tells you to stop. There is a maximum amount of  this medicine you should receive throughout your life. The amount depends on the medical condition being treated and your overall health. Your doctor will watch how much of this medicine you receive in  your lifetime. Tell your doctor if you have taken this medicine before. You may need blood work done while you are taking this medicine. Your urine may turn red for a few days after your dose. This is not blood. If your urine is dark or brown, call your doctor. In some cases, you may be given additional medicines to help with side effects. Follow all directions for their use. Call your doctor or health care professional for advice if you get a fever, chills or sore throat, or other symptoms of a cold or flu. Do not treat yourself. This drug decreases your body's ability to fight infections. Try to avoid being around people who are sick. This medicine may increase your risk to bruise or bleed. Call your doctor or health care professional if you notice any unusual bleeding. Talk to your doctor about your risk of cancer. You may be more at risk for certain types of cancers if you take this medicine. Do not become pregnant while taking this medicine or for 6 months after stopping it. Women should inform their doctor if they wish to become pregnant or think they might be pregnant. Men should not father a child while taking this medicine and for 6 months after stopping it. There is a potential for serious side effects to an unborn child. Talk to your health care professional or pharmacist for more information. Do not breast-feed an infant while taking this medicine. This medicine has caused ovarian failure in some women and reduced sperm counts in some men This medicine may interfere with the ability to have a child. Talk with your doctor or health care professional if you are concerned about your fertility. What side effects may I notice from receiving this medicine? Side effects that you should report to your doctor or health care professional as soon as possible: -allergic reactions like skin rash, itching or hives, swelling of the face, lips, or tongue -breathing problems -chest pain -fast or irregular  heartbeat -low blood counts - this medicine may decrease the number of white blood cells, red blood cells and platelets. You may be at increased risk for infections and bleeding. -pain, redness, or irritation at site where injected -signs of infection - fever or chills, cough, sore throat, pain or difficulty passing urine -signs of decreased platelets or bleeding - bruising, pinpoint red spots on the skin, black, tarry stools, blood in the urine -swelling of the ankles, feet, hands -tiredness -weakness Side effects that usually do not require medical attention (report to your doctor or health care professional if they continue or are bothersome): -diarrhea -hair loss -mouth sores -nail discoloration or damage -nausea -red colored urine -vomiting This list may not describe all possible side effects. Call your doctor for medical advice about side effects. You may report side effects to FDA at 1-800-FDA-1088. Where should I keep my medicine? This drug is given in a hospital or clinic and will not be stored at home. NOTE: This sheet is a summary. It may not cover all possible information. If you have questions about this medicine, talk to your doctor, pharmacist, or health care provider.  2018 Elsevier/Gold Standard (2015-11-24 11:28:51)  Vincristine injection What is this medicine? VINCRISTINE (vin KRIS teen) is a chemotherapy drug. It slows the growth of cancer cells. This medicine  is used to treat many types of cancer like Hodgkin's disease, leukemia, non-Hodgkin's lymphoma, neuroblastoma (brain cancer), rhabdomyosarcoma, and Wilms' tumor. This medicine may be used for other purposes; ask your health care provider or pharmacist if you have questions. COMMON BRAND NAME(S): Oncovin, Vincasar PFS What should I tell my health care provider before I take this medicine? They need to know if you have any of these conditions: -blood disorders -gout -infection (especially chickenpox, cold  sores, or herpes) -kidney disease -liver disease -lung disease -nervous system disease like Charcot-Marie-Tooth (CMT) -recent or ongoing radiation therapy -an unusual or allergic reaction to vincristine, other chemotherapy agents, other medicines, foods, dyes, or preservatives -pregnant or trying to get pregnant -breast-feeding How should I use this medicine? This drug is given as an infusion into a vein. It is administered in a hospital or clinic by a specially trained health care professional. If you have pain, swelling, burning, or any unusual feeling around the site of your injection, tell your health care professional right away. Talk to your pediatrician regarding the use of this medicine in children. While this drug may be prescribed for selected conditions, precautions do apply. Overdosage: If you think you have taken too much of this medicine contact a poison control center or emergency room at once. NOTE: This medicine is only for you. Do not share this medicine with others. What if I miss a dose? It is important not to miss your dose. Call your doctor or health care professional if you are unable to keep an appointment. What may interact with this medicine? Do not take this medicine with any of the following medications: -itraconazole -mibefradil -voriconazole This medicine may also interact with the following medications: -cyclosporine -erythromycin -fluconazole -ketoconazole -medicines for HIV like delavirdine, efavirenz, nevirapine -medicines for seizures like ethotoin, fosphenotoin, phenytoin -medicines to increase blood counts like filgrastim, pegfilgrastim, sargramostim -other chemotherapy drugs like cisplatin, L-asparaginase, methotrexate, mitomycin, paclitaxel -pegaspargase -vaccines -zalcitabine, ddC Talk to your doctor or health care professional before taking any of these medicines: -acetaminophen -aspirin -ibuprofen -ketoprofen -naproxen This list may not  describe all possible interactions. Give your health care provider a list of all the medicines, herbs, non-prescription drugs, or dietary supplements you use. Also tell them if you smoke, drink alcohol, or use illegal drugs. Some items may interact with your medicine. What should I watch for while using this medicine? Your condition will be monitored carefully while you are receiving this medicine. You will need important blood work done while you are taking this medicine. This drug may make you feel generally unwell. This is not uncommon, as chemotherapy can affect healthy cells as well as cancer cells. Report any side effects. Continue your course of treatment even though you feel ill unless your doctor tells you to stop. In some cases, you may be given additional medicines to help with side effects. Follow all directions for their use. Call your doctor or health care professional for advice if you get a fever, chills or sore throat, or other symptoms of a cold or flu. Do not treat yourself. Avoid taking products that contain aspirin, acetaminophen, ibuprofen, naproxen, or ketoprofen unless instructed by your doctor. These medicines may hide a fever. Do not become pregnant while taking this medicine. Women should inform their doctor if they wish to become pregnant or think they might be pregnant. There is a potential for serious side effects to an unborn child. Talk to your health care professional or pharmacist for more information. Do not breast-feed  an infant while taking this medicine. Men may have a lower sperm count while taking this medicine. Talk to your doctor if you plan to father a child. What side effects may I notice from receiving this medicine? Side effects that you should report to your doctor or health care professional as soon as possible: -allergic reactions like skin rash, itching or hives, swelling of the face, lips, or tongue -breathing problems -confusion or changes in emotions  or moods -constipation -cough -mouth sores -muscle weakness -nausea and vomiting -pain, swelling, redness or irritation at the injection site -pain, tingling, numbness in the hands or feet -problems with balance, talking, walking -seizures -stomach pain -trouble passing urine or change in the amount of urine Side effects that usually do not require medical attention (report to your doctor or health care professional if they continue or are bothersome): -diarrhea -hair loss -jaw pain -loss of appetite This list may not describe all possible side effects. Call your doctor for medical advice about side effects. You may report side effects to FDA at 1-800-FDA-1088. Where should I keep my medicine? This drug is given in a hospital or clinic and will not be stored at home. NOTE: This sheet is a summary. It may not cover all possible information. If you have questions about this medicine, talk to your doctor, pharmacist, or health care provider.  2018 Elsevier/Gold Standard (2008-06-24 17:17:13)  Cyclophosphamide injection What is this medicine? CYCLOPHOSPHAMIDE (sye kloe FOSS fa mide) is a chemotherapy drug. It slows the growth of cancer cells. This medicine is used to treat many types of cancer like lymphoma, myeloma, leukemia, breast cancer, and ovarian cancer, to name a few. This medicine may be used for other purposes; ask your health care provider or pharmacist if you have questions. COMMON BRAND NAME(S): Cytoxan, Neosar What should I tell my health care provider before I take this medicine? They need to know if you have any of these conditions: -blood disorders -history of other chemotherapy -infection -kidney disease -liver disease -recent or ongoing radiation therapy -tumors in the bone marrow -an unusual or allergic reaction to cyclophosphamide, other chemotherapy, other medicines, foods, dyes, or preservatives -pregnant or trying to get pregnant -breast-feeding How should  I use this medicine? This drug is usually given as an injection into a vein or muscle or by infusion into a vein. It is administered in a hospital or clinic by a specially trained health care professional. Talk to your pediatrician regarding the use of this medicine in children. Special care may be needed. Overdosage: If you think you have taken too much of this medicine contact a poison control center or emergency room at once. NOTE: This medicine is only for you. Do not share this medicine with others. What if I miss a dose? It is important not to miss your dose. Call your doctor or health care professional if you are unable to keep an appointment. What may interact with this medicine? This medicine may interact with the following medications: -amiodarone -amphotericin B -azathioprine -certain antiviral medicines for HIV or AIDS such as protease inhibitors (e.g., indinavir, ritonavir) and zidovudine -certain blood pressure medications such as benazepril, captopril, enalapril, fosinopril, lisinopril, moexipril, monopril, perindopril, quinapril, ramipril, trandolapril -certain cancer medications such as anthracyclines (e.g., daunorubicin, doxorubicin), busulfan, cytarabine, paclitaxel, pentostatin, tamoxifen, trastuzumab -certain diuretics such as chlorothiazide, chlorthalidone, hydrochlorothiazide, indapamide, metolazone -certain medicines that treat or prevent blood clots like warfarin -certain muscle relaxants such as succinylcholine -cyclosporine -etanercept -indomethacin -medicines to increase blood counts like  filgrastim, pegfilgrastim, sargramostim -medicines used as general anesthesia -metronidazole -natalizumab This list may not describe all possible interactions. Give your health care provider a list of all the medicines, herbs, non-prescription drugs, or dietary supplements you use. Also tell them if you smoke, drink alcohol, or use illegal drugs. Some items may interact with your  medicine. What should I watch for while using this medicine? Visit your doctor for checks on your progress. This drug may make you feel generally unwell. This is not uncommon, as chemotherapy can affect healthy cells as well as cancer cells. Report any side effects. Continue your course of treatment even though you feel ill unless your doctor tells you to stop. Drink water or other fluids as directed. Urinate often, even at night. In some cases, you may be given additional medicines to help with side effects. Follow all directions for their use. Call your doctor or health care professional for advice if you get a fever, chills or sore throat, or other symptoms of a cold or flu. Do not treat yourself. This drug decreases your body's ability to fight infections. Try to avoid being around people who are sick. This medicine may increase your risk to bruise or bleed. Call your doctor or health care professional if you notice any unusual bleeding. Be careful brushing and flossing your teeth or using a toothpick because you may get an infection or bleed more easily. If you have any dental work done, tell your dentist you are receiving this medicine. You may get drowsy or dizzy. Do not drive, use machinery, or do anything that needs mental alertness until you know how this medicine affects you. Do not become pregnant while taking this medicine or for 1 year after stopping it. Women should inform their doctor if they wish to become pregnant or think they might be pregnant. Men should not father a child while taking this medicine and for 4 months after stopping it. There is a potential for serious side effects to an unborn child. Talk to your health care professional or pharmacist for more information. Do not breast-feed an infant while taking this medicine. This medicine may interfere with the ability to have a child. This medicine has caused ovarian failure in some women. This medicine has caused reduced sperm  counts in some men. You should talk with your doctor or health care professional if you are concerned about your fertility. If you are going to have surgery, tell your doctor or health care professional that you have taken this medicine. What side effects may I notice from receiving this medicine? Side effects that you should report to your doctor or health care professional as soon as possible: -allergic reactions like skin rash, itching or hives, swelling of the face, lips, or tongue -low blood counts - this medicine may decrease the number of white blood cells, red blood cells and platelets. You may be at increased risk for infections and bleeding. -signs of infection - fever or chills, cough, sore throat, pain or difficulty passing urine -signs of decreased platelets or bleeding - bruising, pinpoint red spots on the skin, black, tarry stools, blood in the urine -signs of decreased red blood cells - unusually weak or tired, fainting spells, lightheadedness -breathing problems -dark urine -dizziness -palpitations -swelling of the ankles, feet, hands -trouble passing urine or change in the amount of urine -weight gain -yellowing of the eyes or skin Side effects that usually do not require medical attention (report to your doctor or health care professional  if they continue or are bothersome): -changes in nail or skin color -hair loss -missed menstrual periods -mouth sores -nausea, vomiting This list may not describe all possible side effects. Call your doctor for medical advice about side effects. You may report side effects to FDA at 1-800-FDA-1088. Where should I keep my medicine? This drug is given in a hospital or clinic and will not be stored at home. NOTE: This sheet is a summary. It may not cover all possible information. If you have questions about this medicine, talk to your doctor, pharmacist, or health care provider.  2018 Elsevier/Gold Standard (2012-08-11  16:22:58)  Rituximab injection What is this medicine? RITUXIMAB (ri TUX i mab) is a monoclonal antibody. It is used to treat certain types of cancer like non-Hodgkin lymphoma and chronic lymphocytic leukemia. It is also used to treat rheumatoid arthritis, granulomatosis with polyangiitis (or Wegener's granulomatosis), and microscopic polyangiitis. This medicine may be used for other purposes; ask your health care provider or pharmacist if you have questions. COMMON BRAND NAME(S): Rituxan What should I tell my health care provider before I take this medicine? They need to know if you have any of these conditions: -heart disease -infection (especially a virus infection such as hepatitis B, chickenpox, cold sores, or herpes) -immune system problems -irregular heartbeat -kidney disease -lung or breathing disease, like asthma -recently received or scheduled to receive a vaccine -an unusual or allergic reaction to rituximab, mouse proteins, other medicines, foods, dyes, or preservatives -pregnant or trying to get pregnant -breast-feeding How should I use this medicine? This medicine is for infusion into a vein. It is administered in a hospital or clinic by a specially trained health care professional. A special MedGuide will be given to you by the pharmacist with each prescription and refill. Be sure to read this information carefully each time. Talk to your pediatrician regarding the use of this medicine in children. This medicine is not approved for use in children. Overdosage: If you think you have taken too much of this medicine contact a poison control center or emergency room at once. NOTE: This medicine is only for you. Do not share this medicine with others. What if I miss a dose? It is important not to miss a dose. Call your doctor or health care professional if you are unable to keep an appointment. What may interact with this medicine? -cisplatin -other medicines for arthritis like  disease modifying antirheumatic drugs or tumor necrosis factor inhibitors -live virus vaccines This list may not describe all possible interactions. Give your health care provider a list of all the medicines, herbs, non-prescription drugs, or dietary supplements you use. Also tell them if you smoke, drink alcohol, or use illegal drugs. Some items may interact with your medicine. What should I watch for while using this medicine? Your condition will be monitored carefully while you are receiving this medicine. You may need blood work done while you are taking this medicine. This medicine can cause serious allergic reactions. To reduce your risk you may need to take medicine before treatment with this medicine. Take your medicine as directed. In some patients, this medicine may cause a serious brain infection that may cause death. If you have any problems seeing, thinking, speaking, walking, or standing, tell your doctor right away. If you cannot reach your doctor, urgently seek other source of medical care. Call your doctor or health care professional for advice if you get a fever, chills or sore throat, or other symptoms of a cold  or flu. Do not treat yourself. This drug decreases your body's ability to fight infections. Try to avoid being around people who are sick. Do not become pregnant while taking this medicine or for 12 months after stopping it. Women should inform their doctor if they wish to become pregnant or think they might be pregnant. There is a potential for serious side effects to an unborn child. Talk to your health care professional or pharmacist for more information. What side effects may I notice from receiving this medicine? Side effects that you should report to your doctor or health care professional as soon as possible: -breathing problems -chest pain -dizziness or feeling faint -fast, irregular heartbeat -low blood counts - this medicine may decrease the number of white blood  cells, red blood cells and platelets. You may be at increased risk for infections and bleeding. -mouth sores -redness, blistering, peeling or loosening of the skin, including inside the mouth (this can be added for any serious or exfoliative rash that could lead to hospitalization) -signs of infection - fever or chills, cough, sore throat, pain or difficulty passing urine -signs and symptoms of kidney injury like trouble passing urine or change in the amount of urine -signs and symptoms of liver injury like dark yellow or brown urine; general ill feeling or flu-like symptoms; light-colored stools; loss of appetite; nausea; right upper belly pain; unusually weak or tired; yellowing of the eyes or skin -stomach pain -vomiting Side effects that usually do not require medical attention (report to your doctor or health care professional if they continue or are bothersome): -headache -joint pain -muscle cramps or muscle pain This list may not describe all possible side effects. Call your doctor for medical advice about side effects. You may report side effects to FDA at 1-800-FDA-1088. Where should I keep my medicine? This drug is given in a hospital or clinic and will not be stored at home. NOTE: This sheet is a summary. It may not cover all possible information. If you have questions about this medicine, talk to your doctor, pharmacist, or health care provider.  2018 [employer] Standard (2016-05-05 15:28:09)

## 2018-06-24 ENCOUNTER — Other Ambulatory Visit: Payer: Self-pay | Admitting: Internal Medicine

## 2018-06-25 MED ORDER — CLONAZEPAM 1 MG PO TABS: 1.0000 mg | ORAL_TABLET | Freq: Four times a day (QID) | ORAL | 1 refills | Status: DC | PRN

## 2018-06-25 NOTE — Telephone Encounter (Signed)
Ok Clonazepam - done OV q 3 mo Thx

## 2018-06-26 ENCOUNTER — Telehealth: Payer: Self-pay | Admitting: Hematology

## 2018-06-26 ENCOUNTER — Inpatient Hospital Stay: Payer: Medicare Other

## 2018-06-26 ENCOUNTER — Telehealth: Payer: Self-pay

## 2018-06-26 VITALS — BP 140/78 | HR 98 | Temp 98.1°F | Resp 18

## 2018-06-26 DIAGNOSIS — C8330 Diffuse large B-cell lymphoma, unspecified site: Secondary | ICD-10-CM

## 2018-06-26 DIAGNOSIS — Z5112 Encounter for antineoplastic immunotherapy: Secondary | ICD-10-CM | POA: Diagnosis not present

## 2018-06-26 MED ORDER — PEGFILGRASTIM-CBQV 6 MG/0.6ML ~~LOC~~ SOSY
PREFILLED_SYRINGE | SUBCUTANEOUS | Status: AC
  Filled 2018-06-26: qty 0.6

## 2018-06-26 MED ORDER — PEGFILGRASTIM-CBQV 6 MG/0.6ML ~~LOC~~ SOSY
6.0000 mg | PREFILLED_SYRINGE | Freq: Once | SUBCUTANEOUS | Status: AC
  Administered 2018-06-26: 6 mg via SUBCUTANEOUS

## 2018-06-26 NOTE — Patient Instructions (Signed)
[facility] Discharge Instructions for Patients Receiving Chemotherapy  Today you received the following chemotherapy agents Udenyca.  To help prevent nausea and vomiting after your treatment, we encourage you to take your nausea medication as directed by your physician.   If you develop nausea and vomiting that is not controlled by your nausea medication, call the clinic.   BELOW ARE SYMPTOMS THAT SHOULD BE REPORTED IMMEDIATELY:  *FEVER GREATER THAN 100.5 F  *CHILLS WITH OR WITHOUT FEVER  NAUSEA AND VOMITING THAT IS NOT CONTROLLED WITH YOUR NAUSEA MEDICATION  *UNUSUAL SHORTNESS OF BREATH  *UNUSUAL BRUISING OR BLEEDING  TENDERNESS IN MOUTH AND THROAT WITH OR WITHOUT PRESENCE OF ULCERS  *URINARY PROBLEMS  *BOWEL PROBLEMS  UNUSUAL RASH Items with * indicate a potential emergency and should be followed up as soon as possible.  Feel free to call the clinic should you have any questions or concerns. The clinic phone number is [phone].  Please show the [place] at check-in to the Emergency Department and triage nurse.

## 2018-06-26 NOTE — Telephone Encounter (Signed)
Scheduled patient per 9/13 los. At patient request (walk in) after injection

## 2018-06-26 NOTE — Telephone Encounter (Signed)
Appts scheduled per 9/13 los - called patient to verify appts and he is aware of all appt date and times on schedule.

## 2018-06-29 ENCOUNTER — Ambulatory Visit: Payer: Medicare Other | Admitting: Cardiovascular Disease

## 2018-06-30 ENCOUNTER — Ambulatory Visit: Payer: Medicare Other | Admitting: Cardiovascular Disease

## 2018-06-30 ENCOUNTER — Other Ambulatory Visit (HOSPITAL_COMMUNITY): Payer: Medicare Other

## 2018-06-30 ENCOUNTER — Ambulatory Visit (HOSPITAL_COMMUNITY): Payer: Medicare Other

## 2018-07-03 ENCOUNTER — Encounter: Payer: Self-pay | Admitting: Hematology

## 2018-07-03 ENCOUNTER — Inpatient Hospital Stay (HOSPITAL_BASED_OUTPATIENT_CLINIC_OR_DEPARTMENT_OTHER): Payer: Medicare Other | Admitting: Hematology

## 2018-07-03 ENCOUNTER — Encounter (HOSPITAL_COMMUNITY): Payer: Self-pay

## 2018-07-03 ENCOUNTER — Inpatient Hospital Stay: Payer: Medicare Other

## 2018-07-03 VITALS — BP 143/62 | HR 112 | Temp 98.3°F | Resp 19 | Ht 75.0 in | Wt 299.7 lb

## 2018-07-03 DIAGNOSIS — C8330 Diffuse large B-cell lymphoma, unspecified site: Secondary | ICD-10-CM

## 2018-07-03 DIAGNOSIS — Z5112 Encounter for antineoplastic immunotherapy: Secondary | ICD-10-CM | POA: Diagnosis not present

## 2018-07-03 DIAGNOSIS — C8339 Diffuse large B-cell lymphoma, extranodal and solid organ sites: Secondary | ICD-10-CM | POA: Diagnosis not present

## 2018-07-03 LAB — LACTATE DEHYDROGENASE: LDH: 200 U/L — AB (ref 98–192)

## 2018-07-03 LAB — CMP (CANCER CENTER ONLY)
ALT: 41 U/L (ref 0–44)
ANION GAP: 11 (ref 5–15)
AST: 20 U/L (ref 15–41)
Albumin: 3 g/dL — ABNORMAL LOW (ref 3.5–5.0)
Alkaline Phosphatase: 69 U/L (ref 38–126)
BILIRUBIN TOTAL: 0.5 mg/dL (ref 0.3–1.2)
BUN: 26 mg/dL — ABNORMAL HIGH (ref 8–23)
CO2: 26 mmol/L (ref 22–32)
Calcium: 9 mg/dL (ref 8.9–10.3)
Chloride: 102 mmol/L (ref 98–111)
Creatinine: 1.26 mg/dL — ABNORMAL HIGH (ref 0.61–1.24)
GFR, EST NON AFRICAN AMERICAN: 55 mL/min — AB (ref >60)
Glucose, Bld: 134 mg/dL — ABNORMAL HIGH (ref 70–99)
Potassium: 4.6 mmol/L (ref 3.5–5.1)
Sodium: 139 mmol/L (ref 135–145)
TOTAL PROTEIN: 6.4 g/dL — AB (ref 6.5–8.1)

## 2018-07-03 LAB — CBC WITH DIFFERENTIAL/PLATELET
BASOS PCT: 1 %
Basophils Absolute: 0 10*3/uL (ref 0.0–0.1)
EOS PCT: 1 %
Eosinophils Absolute: 0.1 10*3/uL (ref 0.0–0.5)
HCT: 47.3 % (ref 38.4–49.9)
Hemoglobin: 15.5 g/dL (ref 13.0–17.1)
Lymphocytes Relative: 28 %
Lymphs Abs: 1.4 10*3/uL (ref 0.9–3.3)
MCH: 30.4 pg (ref 27.2–33.4)
MCHC: 32.8 g/dL (ref 32.0–36.0)
MCV: 92.7 fL (ref 79.3–98.0)
Monocytes Absolute: 0.6 10*3/uL (ref 0.1–0.9)
Monocytes Relative: 13 %
NEUTROS ABS: 2.9 10*3/uL (ref 1.5–6.5)
Neutrophils Relative %: 57 %
PLATELETS: 112 10*3/uL — AB (ref 140–400)
RBC: 5.1 MIL/uL (ref 4.20–5.82)
RDW: 14 % (ref 11.0–14.6)
WBC: 4.9 10*3/uL (ref 4.0–10.3)

## 2018-07-03 LAB — URIC ACID: Uric Acid, Serum: 7.2 mg/dL (ref 3.7–8.6)

## 2018-07-03 NOTE — Progress Notes (Signed)
HEMATOLOGY/ONCOLOGY CLINIC NOTE  Date of Service: 07/03/2018  Patient Care Team: Plotnikov, Evie Lacks, MD as PCP - General (Internal Medicine)  CHIEF COMPLAINTS/PURPOSE OF CONSULTATION:  Follow Up for Diffuse Large B-Cell Lymphoma   HISTORY OF PRESENTING ILLNESS:   [name] is a wonderful 72 y.o. male who has been referred to [place] by Dr. Lew Dawes for evaluation and management of Diffuse Large B-Cell Lymphoma. He is accompanied today by his partner. The pt reports that he is doing well overall.   The pt reports that he presented to ENT Dr. Pollie Friar care after developing a cough and a new nodule in his mouth. He notes that the symptoms presented about a month ago and have not progressed or significantly worsened. He notes that he has frequent sinus drainage, and felt that this enlargement could initially be related to this. He denies any changes in his voice and difficulty breathing.   The pt notes that he is finishing three months of PT tomorrow after surgeries on his right leg and left knee. He notes that he takes blood thinners for Afib and uses his CPAP regularly now. The pt also receives testosterone injections, 200mg  every other week. The pt notes that he had several surgeries bilaterally to remove large kidney stones in the past. He notes that his thyroid replacement has been stable. His vertebral compression fracture was in 2013, after a fall in which he also broke his right femur and wrist. The pt also takes Prolia every 6 months for his osteoporosis. He has had 18 surgeries on his right leg.   He notes that his psoriasis has been stable and takes Enbrel twice a week, and has taken this for 5 years. He was on 10 years of Humara before this. He was on Methotrexate prior to [facility].   The pt notes that he functions okay at home and is able to drive himself and cook for himself and feels that he can function independently with limitations on lifting heavier objects.   On  review of systems, pt reports recent cough, left tonsil mass, stable energy levels, and denies fevers, chills, night sweats, unexpected weight loss, difficulty breathing, difficulty swallowing, noticing any other lumps or bumps, pain along the spine, recent infections, abdominal pains, difficulty urinating, leg swelling, and any other symptoms.  . On Social Hx the pt reports that he smoked cigarettes for many years until he quit 6 years ago, and drinks ETOH socially.    INTERVAL HISTORY   [name] is here for management, evaluation, and C1D8 R-CHOP treatment of his Diffuse Large B-Cell Lymphoma. The patient's last visit with [place] was on 06/23/18. He is accompanied today by his partner. The pt reports that he is doing well overall.   The pt reports that he has tolerated C1 very well overall. He notes that he has had some fatigue thus far and denies walking 30 minutes each day. He also denies this mild fatigue keeping him from performing his activities of daily living. He notes that has had some tingling in his finger tips as well.  The pt notes that he has has had some bone aches after receiving his Neulasta injection but denies these being too severe and hasn't taken any medications yet.   Lab results today (07/03/18) of CBC w/diff, CMP is as follows: all values are WNL except for PLT at 112k. 07/03/18 LDH is 200 07/03/18 Uric acid is wnl 7.2   On review of systems, pt reports  some finger tip tingling, some fatigue, and denies fevers, chills, night sweats, mouth sores, new skin rashes, and any other symptoms.   MEDICAL HISTORY:  Past Medical History:  Diagnosis Date  . A-fib (HCC)    hx.- sinus rhythm now  . Acid reflux   . AKI (acute kidney injury) (HCC)    mild  . Anxiety   . Arthritis   . Atrial dilatation, bilateral    Severly, enlargement  per ECHO 11/03/16  . BPH (benign prostatic hyperplasia)   . CAD (coronary artery disease)   . Depression   . Dislocation of internal right  hip prosthesis, sequela   . Dysrhythmia    hx a fib  . Elevated brain natriuretic peptide (BNP) level   . Environmental allergies   . Fracture of wrist 10/23/13   LEFT  . High cholesterol   . History of Graves' disease   . History of kidney stones    surg. removal 02/2017  . History of syncope   . HLD (hyperlipidemia)   . Hypertension   . Hypothyroidism    thyroid removed  . Left knee injury    cap dislocated  . Low testosterone, possible hypogonadism 10/01/2012  . Lumbago   . Metabolic bone disease   . Multiple falls   . Osteoporosis    Severe  . Plaque psoriasis   . Sleep apnea    cpap machine-settings 17  . Thrombocytopenia ([place])   . Thyroid disease    HX GRAVES DISEASE  . Transfusion history    s/p 12'13 hip surgery  . Unsteady gait   . UTI (urinary tract infection)   . Vertebral compression fracture (HCC)    L1-wears brace    SURGICAL HISTORY: Past Surgical History:  Procedure Laterality Date  . APPENDECTOMY    . CARDIAC CATHETERIZATION  2013  . CATARACT EXTRACTION, BILATERAL    . COLONOSCOPY    . CYSTOSCOPY WITH RETROGRADE PYELOGRAM, URETEROSCOPY AND STENT PLACEMENT Left 11/07/2017   Procedure: CYSTOSCOPY WITH RETROGRADE PYELOGRAM, URETEROSCOPY AND STENT PLACEMENT;  Surgeon: Cleon Gustin, MD;  Location: [facility];  Service: Urology;  Laterality: Left;  . CYSTOSCOPY/URETEROSCOPY/HOLMIUM LASER/STENT PLACEMENT Right 03/11/2017   Procedure: CYSTOSCOPY/URETEROSCOPYSTENT PLACEMENT right ureter retrograde pylegram;  Surgeon: Cleon Gustin, MD;  Location: [facility];  Service: Urology;  Laterality: Right;  . HARDWARE REMOVAL  10/05/2012   Procedure: HARDWARE REMOVAL;  Surgeon: [name], MD;  Location: [facility];  Service: Orthopedics;  Laterality: Right;  REMOVING  STRYKER  GAMMA NAIL  . HARDWARE REMOVAL Right 07/03/2013   Procedure: HARDWARE REMOVAL RIGHT TIBIA ;  Surgeon: Rozanna Box, MD;  Location: [place];  Service: Orthopedics;  Laterality: Right;  . HIP  CLOSED REDUCTION Right 10/15/2013   Procedure: CLOSED MANIPULATION HIP;  Surgeon: [name], MD;  [facility]: [facility];  Service: Orthopedics;  Laterality: Right;  . hip revision     June 2017  . HOLMIUM LASER APPLICATION Right 3/0/8657   Procedure: HOLMIUM LASER APPLICATION;  Surgeon: Cleon Gustin, MD;  [facility]: [facility];  Service: Urology;  Laterality: Right;  . INCISION AND DRAINAGE HIP Right 11/16/2013   Procedure: IRRIGATION AND DEBRIDEMENT RIGHT HIP;  Surgeon: [name], MD;  [facility]: [facility];  Service: Orthopedics;  Laterality: Right;  . IR IMAGING GUIDED PORT INSERTION  06/22/2018  . IR URETERAL STENT LEFT NEW ACCESS W/O SEP NEPHROSTOMY CATH  10/24/2017  . JOINT REPLACEMENT     hip-right x2  . KNEE SURGERY  Bilateral 2012   Total knee replacements  . NEPHROLITHOTOMY Right 02/08/2017   Procedure: NEPHROLITHOTOMY PERCUTANEOUS WITH SURGEON ACCESS;  Surgeon: Cleon Gustin, MD;  [facility];  Service: Urology;  Laterality: Right;  . NEPHROLITHOTOMY Left 10/24/2017   Procedure: NEPHROLITHOTOMY PERCUTANEOUS;  Surgeon: Cleon Gustin, MD;  [facility];  Service: Urology;  Laterality: Left;  . ORIF TIBIA FRACTURE Right 02/06/2013   Procedure: OPEN REDUCTION INTERNAL FIXATION (ORIF) TIBIA FRACTURE WITH IM ROD FIBULA;  Surgeon: Rozanna Box, MD;  Location: [place];  Service: Orthopedics;  Laterality: Right;  . ORIF TIBIA FRACTURE Right 07/03/2013   Procedure: RIGHT TIBIA NON UNION REPAIR ;  Surgeon: Rozanna Box, MD;  Location: [place];  Service: Orthopedics;  Laterality: Right;  . ORIF WRIST FRACTURE  10/02/2012   Procedure: OPEN REDUCTION INTERNAL FIXATION (ORIF) WRIST FRACTURE;  Surgeon: Roseanne Kaufman, MD;  [facility];  Service: Orthopedics;  Laterality: Right;  WITH   ANTIBIOTIC  CEMENT  . ORIF WRIST FRACTURE Left 10/28/2013   Procedure: OPEN REDUCTION INTERNAL FIXATION (ORIF) WRIST FRACTURE with allograft;  Surgeon: Roseanne Kaufman, MD;  [facility];   Service: Orthopedics;  Laterality: Left;  DVR Plate  . QUADRICEPS TENDON REPAIR Left 07/15/2017   Procedure: REPAIR QUADRICEP TENDON;  Surgeon: Frederik Pear, MD;  Location: [place];  Service: Orthopedics;  Laterality: Left;  . right femur surgery  05/2012  . THYROIDECTOMY  02/1986  . TOTAL HIP REVISION  10/05/2012   Procedure: TOTAL HIP REVISION;  Surgeon: [name], MD;  [facility];  Service: Orthopedics;  Laterality: Right;  RIGHT TOTAL HIP REVISION  . TOTAL HIP REVISION Right 09/17/2013   Procedure: REVISION RIGHT TOTAL HIP ARTHROPLASTY ;  Surgeon: [name], MD;  [facility];  Service: Orthopedics;  Laterality: Right;  . TOTAL HIP REVISION Right 10/26/2013   Procedure: REVISION RIGHT TOTAL HIP ARTHROPLASTY;  Surgeon: [name], MD;  [facility];  Service: Orthopedics;  Laterality: Right;  . TOTAL HIP REVISION  03/2016  . TOTAL KNEE REVISION Left 04/11/2017   Procedure: TOTAL KNEE REVISION PATELLA and TIBIA;  Surgeon: Frederik Pear, MD;  Location: [place];  Service: Orthopedics;  Laterality: Left;  . WRIST FRACTURE SURGERY  05/2012    SOCIAL HISTORY: Social History   Socioeconomic History  . Marital status: Married    Spouse name: Not on file  . Number of children: Not on file  . Years of education: Not on file  . Highest education level: Not on file  Occupational History  . Not on file  Social Needs  . Financial resource strain: Not on file  . Food insecurity:    Worry: Not on file    Inability: Not on file  . Transportation needs:    Medical: Not on file    Non-medical: Not on file  Tobacco Use  . Smoking status: Former Smoker    Last attempt to quit: 06/05/2012    Years since quitting: 6.0  . Smokeless tobacco: Never Used  Substance and Sexual Activity  . Alcohol use: Yes    Comment: occasional-social  . Drug use: No  . Sexual activity: Yes  Lifestyle  . Physical activity:    Days per week: Not on file    Minutes per session: Not on file  .  Stress: Not on file  Relationships  . Social connections:    Talks on phone: Not on file    Gets together: Not  on file    Attends religious service: Not on file    Active member of club or organization: Not on file    Attends meetings of clubs or organizations: Not on file    Relationship status: Not on file  . Intimate partner violence:    Fear of current or ex partner: Not on file    Emotionally abused: Not on file    Physically abused: Not on file    Forced sexual activity: Not on file  Other Topics Concern  . Not on file  Social History Narrative   [place] 2.5 months, went home Feb 21st slipped and fell on back and developed.  Home PT/OT.  Just started outpatient physical therapy.  Friday night, misstepped.      FAMILY HISTORY: Family History  Problem Relation Age of Onset  . CAD Father 58  . Asthma Father   . Alcohol abuse Father   . Arthritis Mother   . Alcohol abuse Sister     ALLERGIES:  is allergic to short ragweed pollen ext.  MEDICATIONS:  Current Outpatient Medications  Medication Sig Dispense Refill  . alfuzosin (UROXATRAL) 10 MG 24 hr tablet Take 10 mg by mouth at bedtime.    [name] amoxicillin (AMOXIL) 500 MG capsule Take 2,000 mg by mouth See admin instructions. Takes 4 capsules 1 hour prior to dental procedures    . atorvastatin (LIPITOR) 80 MG tablet Take 80 mg by mouth daily.    [name] buPROPion (WELLBUTRIN XL) 300 MG 24 hr tablet TAKE 1 TABLET DAILY WITH  BREAKFAST BY MOUTH. (Patient taking differently: Take 300 mg by mouth daily. ) 90 tablet 3  . Calcium Citrate-Vitamin D (CALCIUM CITRATE + D PO) Take 1 tablet by mouth 2 (two) times daily.     . Cholecalciferol (VITAMIN D-3 PO) Take 2,000 Units by mouth 2 (two) times daily.     . clonazePAM (KLONOPIN) 1 MG tablet Take 1 tablet (1 mg total) by mouth every 6 (six) hours as needed for anxiety. TAKE 1 TABLET BY MOUTH  EVERY 6 HOURS prn 360 tablet 1  . denosumab (PROLIA) 60 MG/ML SOLN injection Inject 60 mg into the  skin every 6 (six) months. Administer in upper arm, thigh, or abdomen    . diltiazem (CARDIZEM CD) 240 MG 24 hr capsule TAKE 1 CAPSULE BY MOUTH  DAILY 90 capsule 3  . doxycycline (VIBRA-TABS) 100 MG tablet TAKE 1 TABLET BY MOUTH  DAILY 90 tablet 1  . DULoxetine (CYMBALTA) 60 MG capsule TAKE 1 CAPSULE BY MOUTH  TWICE DAILY 180 capsule 1  . EPINEPHrine (EPIPEN 2-PAK) 0.3 mg/0.3 mL IJ SOAJ injection Inject 0.3 mg into the muscle daily as needed (for anaphylactic reaction).    [name] etanercept (ENBREL) 50 MG/ML injection Inject 50 mg into the skin 2 (two) times a week. Tuesday & Friday    . ferrous sulfate 325 (65 FE) MG tablet Take 325 mg by mouth 2 (two) times daily with a meal.    . HYDROcodone-acetaminophen (NORCO) 7.5-325 MG tablet Take 1 tablet by mouth 4 (four) times daily. Please fill on 06/03/18 120 tablet 0  . HYDROcodone-acetaminophen (NORCO) 7.5-325 MG tablet Take 1 tablet by mouth 4 (four) times daily as needed for moderate pain. Please fill on 07/03/18 120 tablet 0  . HYDROcodone-acetaminophen (NORCO) 7.5-325 MG tablet Take 1 tablet by mouth 4 (four) times daily as needed for moderate pain. Please fill on 08/02/18 120 tablet 0  . ipratropium (ATROVENT) 0.03 % nasal spray  Place 1 spray into the nose daily as needed.    [name] levothyroxine (SYNTHROID, LEVOTHROID) 200 MCG tablet TAKE 1 TABLET DAILY AT 6 AM BY MOUTH. (Patient taking differently: Take 225 mcg by mouth daily before breakfast. ) 90 tablet 3  . levothyroxine (SYNTHROID, LEVOTHROID) 25 MCG tablet TAKE 1 TABLET BY MOUTH  DAILY BEFORE BREAKFAST. (Patient taking differently: Take 225 mcg by mouth daily before breakfast. ) 90 tablet 3  . lidocaine-prilocaine (EMLA) cream Apply to affected area once 30 g 3  . loratadine (CLARITIN) 10 MG tablet Take 10 mg by mouth daily.     [name] LORazepam (ATIVAN) 0.5 MG tablet Take 1 tablet (0.5 mg total) by mouth every 6 (six) hours as needed (Nausea or vomiting). 30 tablet 0  . mirabegron ER (MYRBETRIQ) 25 MG  TB24 tablet Take 25 mg by mouth at bedtime.    . Multiple Vitamin (MULTIVITAMIN WITH MINERALS) TABS tablet Take 1 tablet by mouth daily. Men's One-A-Day 17+    . nitroGLYCERIN (NITROSTAT) 0.4 MG SL tablet Place 0.4 mg under the tongue every 5 (five) minutes as needed for chest pain. x3 doses as needed for chest pain    . omeprazole (PRILOSEC) 40 MG capsule TAKE 1 CAPSULE DAILY BEFORE BREAKFAST BY MOUTH. (Patient taking differently: Take 40 mg by mouth daily. ) 90 capsule 3  . ondansetron (ZOFRAN) 8 MG tablet Take 1 tablet (8 mg total) by mouth 2 (two) times daily as needed for refractory nausea / vomiting. chemotherapy. 30 tablet 1  . predniSONE (DELTASONE) 20 MG tablet Take 3 tablets (60 mg total) by mouth daily. Take on days 1-5 of chemotherapy. 15 tablet 6  . PROAIR HFA 108 (90 Base) MCG/ACT inhaler Inhale 1 puff into the lungs every 4 (four) hours as needed.    . Probiotic Product (PROBIOTIC PO) Take 1 capsule by mouth daily with breakfast.    . prochlorperazine (COMPAZINE) 10 MG tablet Take 1 tablet (10 mg total) by mouth every 6 (six) hours as needed (Nausea or vomiting). 30 tablet 6  . testosterone enanthate (DELATESTRYL) 200 MG/ML injection Inject 1 mL (200 mg total) into the muscle every 14 (fourteen) days. For IM use only- discard vial after 28 days (Patient taking differently: Inject 100 mg into the muscle every 14 (fourteen) days. For IM use only- discard vial after 28 days) 5 mL 5  . tiZANidine (ZANAFLEX) 4 MG tablet TAKE 1 TABLET BY MOUTH  EVERY 6 HOURS AS NEEDED FOR MUSCLE SPASM(S) (Patient taking differently: Take 4 mg by mouth every 6 (six) hours as needed for muscle spasms. ) 360 tablet 1  . triamcinolone ointment (KENALOG) 0.5 % Apply 1 application topically 2 (two) times daily. To areas of psoriasis as instructed. 30 g 1  . vitamin C (ASCORBIC ACID) 500 MG tablet Take 500 mg by mouth daily.    Alveda Reasons 10 MG TABS tablet TAKE 1 TABLET DAILY BY  MOUTH. 90 tablet 3   No current  facility-administered medications for this visit.     REVIEW OF SYSTEMS:    A 10+ POINT REVIEW OF SYSTEMS WAS OBTAINED including neurology, dermatology, psychiatry, cardiac, respiratory, lymph, extremities, GI, GU, Musculoskeletal, constitutional, breasts, reproductive, HEENT.  All pertinent positives are noted in the HPI.  All others are negative.   PHYSICAL EXAMINATION: ECOG PERFORMANCE STATUS: 2 - Symptomatic, <50% confined to bed  . Vitals:   07/03/18 0845  BP: (!) 143/62  Pulse: (!) 112  Resp: 19  Temp: 98.3 F (36.8  C)  SpO2: 98%   Filed Weights   07/03/18 0845  Weight: 299 lb 11.2 oz (135.9 kg)   .Body mass index is 37.46 kg/m.  GENERAL:alert, in no acute distress and comfortable SKIN: chronic venous stasis changes in BLE, psoriasis rashes on elbows EYES: conjunctiva are pink and non-injected, sclera anicteric OROPHARYNX: enlarged left tonsil with erythema extending up to midline in the posterior cavity NECK: supple, no JVD LYMPH:  no palpable lymphadenopathy in the axillary or inguinal regions, (+) couple palpable enlarged cervical lymph node b/l LUNGS: clear to auscultation b/l with normal respiratory effort HEART: regular rate & rhythm ABDOMEN:  normoactive bowel sounds , non tender, not distended. No palpable hepatosplenomegaly.  Extremity: no pedal edema PSYCH: alert & oriented x 3 with fluent speech NEURO: no focal motor/sensory deficits   LABORATORY DATA:  I have reviewed the data as listed  . CBC Latest Ref Rng & Units 07/03/2018 06/23/2018 06/22/2018  WBC 4.0 - 10.3 K/uL 4.9 6.0 6.9  Hemoglobin 13.0 - 17.1 g/dL 15.5 15.7 17.3(H)  Hematocrit 38.4 - 49.9 % 47.3 47.5 53.3(H)  Platelets 140 - 400 K/uL 112(L) 143 174    . CMP Latest Ref Rng & Units 07/03/2018 06/23/2018 06/22/2018  Glucose 70 - 99 mg/dL 134(H) 107(H) 128(H)  BUN 8 - 23 mg/dL 26(H) 28(H) 25(H)  Creatinine 0.61 - 1.24 mg/dL 1.26(H) 1.33(H) 1.32(H)  Sodium 135 - 145 mmol/L 139 139 137    Potassium 3.5 - 5.1 mmol/L 4.6 4.6 4.2  Chloride 98 - 111 mmol/L 102 103 102  CO2 22 - 32 mmol/L 26 27 24   Calcium 8.9 - 10.3 mg/dL 9.0 9.2 9.5  Total Protein 6.5 - 8.1 g/dL 6.4(L) 7.4 -  Total Bilirubin 0.3 - 1.2 mg/dL 0.5 0.9 -  Alkaline Phos 38 - 126 U/L 69 75 -  AST 15 - 41 U/L 20 24 -  ALT 0 - 44 U/L 41 28 -   05/29/18 Left tonsil biopsy:   RADIOGRAPHIC STUDIES: I have personally reviewed the radiological images as listed and agreed with the findings in the report. Nm Pet Image Initial (pi) Skull Base To Thigh  Result Date: 06/16/2018 CLINICAL DATA:  Initial treatment strategy for B-cell lymphoma. EXAM: NUCLEAR MEDICINE PET SKULL BASE TO THIGH TECHNIQUE: 15.3 mCi F-18 FDG was injected intravenously. Full-ring PET imaging was performed from the skull base to thigh after the radiotracer. CT data was obtained and used for attenuation correction and anatomic localization. Fasting blood glucose: 94 mg/dl COMPARISON:  Multiple exams, including CT abdomen from 03/10/2018 FINDINGS: Mediastinal blood pool activity: SUV max 2.5 Background hepatic parenchymal activity: SUV max 3.4 NECK: Left palatine tonsillar mass 3.1 cm in diameter, maximum SUV 19.8. This may extend to involve the left lingual tonsil/left tongue base. Hypermetabolic right tonsillar activity with maximum SUV 9.8, measuring 1.8 cm in long axis. Left level IIa lymph node measures 1.0 cm in short axis on image 33/4 with maximum SUV 11.7. Left level IIb lymph node measures 1.0 cm in short axis on image 32/4 with maximum SUV 13.0. A presumed lymph node in the subcutaneous tissues superficial to the right lower sternocleidomastoid muscle measures 2.3 cm in short axis on image 41/4 and has a maximum SUV of 2.9. Accentuated activity in the nose is likely incidental. Incidental CT findings: Bilateral common carotid atherosclerotic calcification. CHEST: No significant abnormal hypermetabolic activity in this region. Incidental CT findings:  Coronary, aortic arch, and branch vessel atherosclerotic vascular disease. Paraseptal emphysema. ABDOMEN/PELVIS: No splenomegaly or  focal splenic lesion. A right inguinal lymph node measuring 1.4 cm in short axis on image 219/4 has a maximum SUV of 13.3. A left inguinal lymph node measuring 9 mm in short axis on image 220/4 has a maximum SUV of 4.4. Incidental CT findings: Bilateral nonobstructive nephrolithiasis. Aortoiliac atherosclerotic vascular disease. Umbilical hernia contains adipose tissues. SKELETON: There is a right total hip prosthesis with cerclage in somewhat exuberant bone along the proximal stem, in addition to intermediate density along the proximal stem above the bony region with accentuated metabolic activity, maximum SUV in this vicinity 13.4. Strictly speaking I cannot exclude lymphomatous involvement in this region. Incidental CT findings: Old right rib fractures, some of which are nonunited. Several scattered small sclerotic lesions in the skeleton are nonspecific but not appreciably hypermetabolic. 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 IMPRESSION: 1. Deauville 5 hypermetabolic lesions in the palatine tonsils, left internal jugular chain, and involving a right inguinal lymph node. Deauville 4 involvement of a left inguinal lymph node and an enlarged subcutaneous lesion favoring lymph node superficial to the lower right sternocleidomastoid muscle in the neck. 2. Other imaging findings of potential clinical significance: Aortic Atherosclerosis (ICD10-I70.0). Coronary atherosclerosis. Emphysema (ICD10-J43.9). Bilateral nonobstructive nephrolithiasis. Umbilical hernia contains adipose tissues. Old right rib fractures some of which are nonunited. 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 Electronically Signed   By: Van Clines M.D.   On: 06/16/2018 19:33   Ir Imaging Guided Port Insertion  Result Date: 06/22/2018 INDICATION: 72 year old male with a history large B-cell lymphoma EXAM: IMPLANTED PORT A CATH PLACEMENT WITH  ULTRASOUND AND FLUOROSCOPIC GUIDANCE MEDICATIONS: 2.0 g Ancef; The antibiotic was administered within an appropriate time interval prior to skin puncture. ANESTHESIA/SEDATION: Moderate (conscious) sedation was employed during this procedure. A total of Versed 1.5 mg and Fentanyl 75 mcg was administered intravenously. Moderate Sedation Time: 5 20 minutes. The patient's level of consciousness and vital signs were monitored continuously by radiology nursing throughout the procedure under my direct supervision. FLUOROSCOPY TIME:  0 minutes, 18  seconds (1.0 mGy) COMPLICATIONS: None PROCEDURE: The procedure, risks, benefits, and alternatives were explained to the patient. Questions regarding the procedure were encouraged and answered. The patient understands and consents to the procedure. Ultrasound survey was performed with images stored and sent to PACs. The right neck and chest was prepped with chlorhexidine, and draped in the usual sterile fashion using maximum barrier technique (cap and mask, sterile gown, sterile gloves, large sterile sheet, hand hygiene and cutaneous antiseptic). Antibiotic prophylaxis was provided with 2.0g Ancef administered IV one hour prior to skin incision. Local anesthesia was attained by infiltration with 1% lidocaine without epinephrine. Ultrasound demonstrated patency of the right internal jugular vein, and this was documented with an image. Under real-time ultrasound guidance, this vein was accessed with a 21 gauge micropuncture needle and image documentation was performed. A small dermatotomy was made at the access site with an 11 scalpel. A 0.018" wire was advanced into the SVC and used to estimate the length of the internal catheter. The access needle exchanged for a 74F micropuncture vascular sheath. The 0.018" wire was then removed and a 0.035" wire advanced into the IVC. An appropriate location for the subcutaneous reservoir was selected. Patient requested a location of the port  apparatus lateral to and existing tattoo on the right chest. After local anesthesia, incision was made. The subcutaneous tissues were then dissected using a combination of blunt and sharp surgical technique and a pocket was formed. A single lumen power injectable portacatheter was then tunneled through  the subcutaneous tissues from the pocket to the dermatotomy and the port reservoir placed within the subcutaneous pocket. The venous access site was then serially dilated and a peel away vascular sheath placed over the wire. The wire was removed and the port catheter advanced into position under fluoroscopic guidance. The catheter tip is positioned in the cavoatrial junction. This was documented with a spot image. The portacatheter was then tested and found to flush and aspirate well. The port was flushed with saline followed by 100 units/mL heparinized saline. The pocket was then closed in two layers using first subdermal inverted interrupted absorbable sutures followed by a running subcuticular suture. The epidermis was then sealed with Dermabond. The dermatotomy at the venous access site was also seal with Dermabond. Patient tolerated the procedure well and remained hemodynamically stable throughout. No complications encountered and no significant blood loss encountered IMPRESSION: Status post right IJ port catheter. Signed, Dulcy Fanny. Dellia Nims, RPVI [facility] Radiology Electronically Signed   By: Corrie Mckusick D.O.   On: 06/22/2018 13:19    ASSESSMENT & PLAN:  72 y.o. male with  1. Diffuse Large B-Cell Lymphoma,- Stage III Left tonsil -05/29/18 Left tonsil biopsy which revealed Diffuse Large B-Cell Lymphoma, which requires treatment  -06/07/18 LDH at 214, will monitor change through treatment.  -Baseline ECHO 06/14/18: EF 50-55% -Standard regimen of R-CHOP with G-CSF support for 6 cycles starting 06/23/18  06/16/18 PET/CT revealed 1. Deauville 5  hypermetabolic lesions in the palatine tonsils, left internal jugular chain, and involving a right inguinal lymph node. Deauville 4 involvement of a left inguinal lymph node and an enlarged subcutaneous lesion favoring lymph node superficial to the lower right sternocleidomastoid muscle in the neck. 2. Other imaging findings of potential clinical significance: Aortic Atherosclerosis. Coronary atherosclerosis. Emphysema. Bilateral nonobstructive nephrolithiasis. Umbilical hernia contains adipose tissues. Old right rib fractures some of which are nonunited. 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   PLAN:   -Recommend that the pt receive annual flu vaccination -Discussed pt labwork today, 07/03/18; PLT at 112k, no anemia, no neutropenia.  -The pt has no prohibitive toxicities from continuing R-CHOP at this time.   -Pt has tolerated C1 well so far -Recommended salt and baking soda mouthwashes 3-4 times each day -Will continue to monitor mild neuropathy in fingertips -Recommend Claritin and Tylenol for mild bone pains related to neulasta, pt will let me know if he develops worsening bone pains  -Pt holding Embrel and testosterone since last visit. I previously prescribed Kenalog for topical use for now. If needed he may restart Testosterone at low dose. Will monitor.  2.  Patient Active Problem List   Diagnosis Date Noted  . Diffuse large B-cell lymphoma ([place]) 06/23/2018  . Port-A-Cath in place 06/23/2018  . Hypogonadism in male 11/22/2017  . Nephrolithiasis 10/24/2017  . Recurrent dislocation of left patella 07/15/2017  . Recurrent subluxation of patella, left 07/08/2017  . S/P revision of total knee 04/11/2017  . Failed total knee, left, initial encounter ([place]) 04/09/2017  . Renal calculus, right 02/08/2017  . Protein-calorie malnutrition, severe ([place]) 12/08/2016  . Chronic obstructive pulmonary disease ([place]) 12/08/2016  . Staghorn renal calculus 11/05/2016  . Wound drainage right hip 11/15/2013  .  Expected blood loss anemia 09/18/2013  . Hyponatremia 09/18/2013  . S/P right TH revision 09/17/2013  . Nonunion, fracture, Right tibia  07/03/2013  . UTI (urinary tract infection) 07/03/2013  . Pathological fracture due to osteoporosis with delayed healing 04/24/2013  . Osteoporosis with  fracture 04/24/2013  . Rash and nonspecific skin eruption 03/08/2013  . Syncope 02/21/2013  . PAF (paroxysmal atrial fibrillation) ([place]) 02/21/2013  . Chronic anticoagulation 02/21/2013  . Fall 02/08/2013  . Physical deconditioning 02/08/2013  . Low back pain radiating to both legs 02/08/2013  . Compression fracture of L1 lumbar vertebra (HCC) 02/08/2013  . Right tibial fracture 02/05/2013  . Obesity, unspecified 02/05/2013  . Osteomyelitis of right wrist ([place]) 11/02/2012  . Anemia 10/06/2012  . HTN (hypertension) 10/03/2012  . Psoriasis 10/03/2012  . Dyslipidemia 10/03/2012  . Vitamin D deficiency 10/03/2012  . GERD (gastroesophageal reflux disease) 10/03/2012  . Hyperglycemia 10/03/2012  . Anxiety 10/03/2012  . Depression 10/03/2012  . CAD (coronary artery disease) 10/03/2012  . DJD (degenerative joint disease) 10/03/2012  . Chronic gingivitis 10/03/2012  . Low testosterone, possible hypogonadism 10/01/2012  . Normocytic anemia 09/30/2012  . Right femoral fracture ([place]) 09/29/2012  . Right forearm fracture 09/29/2012  . Atrial fibrillation ([place]) 09/29/2012  . Hypothyroidism 09/29/2012  . History of recurrent UTIs 09/29/2012   -continue mx of medical co-morbidities with PCP   RTC as per scheduled appointments for C2 of R-CHOP/labs and MD visit.   All of the patients questions were answered with apparent satisfaction. The patient knows to call the clinic with any problems, questions or concerns.  The total time spent in the appt was 25 minutes and more than 50% was on counseling and direct patient cares.     Sullivan Lone MD MS AAHIVMS [facility] Hematology/Oncology Physician [facility]  (Office):       [phone] (Work cell):  [phone] (Fax):           [phone]  07/03/2018 9:16 AM  I, Baldwin [place], am acting as a scribe for Dr. Irene Limbo  .I have reviewed the above documentation for accuracy and completeness, and I agree with the above. Brunetta Genera MD

## 2018-07-04 ENCOUNTER — Ambulatory Visit (INDEPENDENT_AMBULATORY_CARE_PROVIDER_SITE_OTHER): Payer: Medicare Other | Admitting: Cardiovascular Disease

## 2018-07-04 ENCOUNTER — Encounter: Payer: Self-pay | Admitting: Cardiovascular Disease

## 2018-07-04 VITALS — BP 119/52 | HR 118 | Ht 75.0 in | Wt 299.5 lb

## 2018-07-04 DIAGNOSIS — C8331 Diffuse large B-cell lymphoma, lymph nodes of head, face, and neck: Secondary | ICD-10-CM

## 2018-07-04 DIAGNOSIS — I2510 Atherosclerotic heart disease of native coronary artery without angina pectoris: Secondary | ICD-10-CM | POA: Diagnosis not present

## 2018-07-04 DIAGNOSIS — I4819 Other persistent atrial fibrillation: Secondary | ICD-10-CM

## 2018-07-04 DIAGNOSIS — Z5181 Encounter for therapeutic drug level monitoring: Secondary | ICD-10-CM

## 2018-07-04 DIAGNOSIS — E7800 Pure hypercholesterolemia, unspecified: Secondary | ICD-10-CM | POA: Diagnosis not present

## 2018-07-04 DIAGNOSIS — I481 Persistent atrial fibrillation: Secondary | ICD-10-CM | POA: Diagnosis not present

## 2018-07-04 DIAGNOSIS — I119 Hypertensive heart disease without heart failure: Secondary | ICD-10-CM

## 2018-07-04 MED ORDER — METOPROLOL TARTRATE 25 MG PO TABS: 25.0000 mg | ORAL_TABLET | Freq: Two times a day (BID) | ORAL | 3 refills | Status: DC

## 2018-07-04 NOTE — Patient Instructions (Signed)
Medication Instructions:  START METOPROLOL 25 MG TWICE A DAY   Labwork: FASTING LIPIDS SOON  Testing/Procedures: NONE  Follow-Up: Your physician recommends that you schedule a follow-up appointment in: 1 MONTH   If you need a refill on your cardiac medications before your next appointment, please call your pharmacy.

## 2018-07-04 NOTE — Progress Notes (Signed)
Cardiology Office Note   Date:  07/04/2018   ID:  Joseph, Lane 22-Apr-1946, MRN [id number]  PCP:  Joseph Anger, MD  Cardiologist:   Skeet Latch, MD   No chief complaint on file.   History of Present Illness: [name] is a 72 y.o. male with CAD, long-standing-persistent atrial fibrillation, hypertension, hyperlipidemia, sleep apnea on CPAP, diffuse large B cell lymphoma,  hypothyroidism and prior tobacco abuse here for follow-up.  He was initially seen 05/2017 to establish care.  [name] was previously a patient of [name] and last saw him 01/2016.  At that time he was preparing for left hip surgery.  [name] recommended stress testing but he declined.  He underwent surgery with no complications.  He was initially diagnosed with atrial fibrillation around 2012.  He was started on diltiazem and amiodarone.  He was noted to be in persistent atrial fibrillation.  Therefore amiodarone was discontinued. At the time of his atrial fibrillation diagnosis he also underwent a heart catheterization. He denies any chest pain at the time. He reportedly had 90% obstruction of an unknown vessel that had developed collaterals. He was started on Ranexa.  He had an echo 11/03/16 revealed LVEF of 60-65%. Severe left and right atrial enlargement was noted and the IVC was dilated.  He had no angina and so Ranexa was discontinued.  Consideration was made for starting a beta-blocker instead of a calcium channel blocker.  However we decided not to make too many changes at one time.  Since his last appointment [name] was diagnosed with diffuse large B cell lymphoma.  He had his first cycle of chemotherapy with rituximab, cyclophosphamide, doxorubicin, vincristine, and prednisone on 9/13.  He tolerated it well and had no nausea.  He has been feeling tired but otherwise well.  He notes that his breathing has been a little bit worse since he was last seen in clinic and also a  little worse since starting chemotherapy.  His physical therapist gave him albuterol which seems to help somewhat.  When he was doing more physical therapy and losing weight the breathing was even better.  He has been eating and drinking well.  He feels some dizziness right after chemotherapy but this has subsided.  He has no orthopnea, PND, or lower extremity edema.  He does not feel palpitations or chest pain.  Atorvastatin was previously reduced due to muscle aches.  However this was increased backed to 80 mg every day 01/2018.  He continues to have some discomfort in his right leg that is unchanged since increasing atorvastatin but worse since starting chemotherapy.  He had a baseline echo 06/2018 that revealed LVEF 50 to 55% and normal diastolic function.  He had apical hypokinesis and GLS was -13.9.    Past Medical History:  Diagnosis Date  . A-fib (HCC)    hx.- sinus rhythm now  . Acid reflux   . AKI (acute kidney injury) (HCC)    mild  . Anxiety   . Arthritis   . Atrial dilatation, bilateral    Severly, enlargement  per ECHO 11/03/16  . BPH (benign prostatic hyperplasia)   . CAD (coronary artery disease)   . Depression   . Dislocation of internal right hip prosthesis, sequela   . Dysrhythmia    hx a fib  . Elevated brain natriuretic peptide (BNP) level   . Environmental allergies   . Fracture of wrist 10/23/13   LEFT  . High  cholesterol   . History of Graves' disease   . History of kidney stones    surg. removal 02/2017  . History of syncope   . HLD (hyperlipidemia)   . Hypertension   . Hypothyroidism    thyroid removed  . Left knee injury    cap dislocated  . Low testosterone, possible hypogonadism 10/01/2012  . Lumbago   . Metabolic bone disease   . Multiple falls   . Osteoporosis    Severe  . Plaque psoriasis   . Sleep apnea    cpap machine-settings 17  . Thrombocytopenia ([place])   . Thyroid disease    HX GRAVES DISEASE  . Transfusion history    s/p 12'13 hip  surgery  . Unsteady gait   . UTI (urinary tract infection)   . Vertebral compression fracture (HCC)    L1-wears brace    Past Surgical History:  Procedure Laterality Date  . APPENDECTOMY    . CARDIAC CATHETERIZATION  2013  . CATARACT EXTRACTION, BILATERAL    . COLONOSCOPY    . CYSTOSCOPY WITH RETROGRADE PYELOGRAM, URETEROSCOPY AND STENT PLACEMENT Left 11/07/2017   Procedure: CYSTOSCOPY WITH RETROGRADE PYELOGRAM, URETEROSCOPY AND STENT PLACEMENT;  Surgeon: Cleon Gustin, MD;  Location: [facility];  Service: Urology;  Laterality: Left;  . CYSTOSCOPY/URETEROSCOPY/HOLMIUM LASER/STENT PLACEMENT Right 03/11/2017   Procedure: CYSTOSCOPY/URETEROSCOPYSTENT PLACEMENT right ureter retrograde pylegram;  Surgeon: Cleon Gustin, MD;  Location: [facility];  Service: Urology;  Laterality: Right;  . HARDWARE REMOVAL  10/05/2012   Procedure: HARDWARE REMOVAL;  Surgeon: [name], MD;  [facility]: [facility];  Service: Orthopedics;  Laterality: Right;  REMOVING  STRYKER  GAMMA NAIL  . HARDWARE REMOVAL Right 07/03/2013   Procedure: HARDWARE REMOVAL RIGHT TIBIA ;  Surgeon: Rozanna Box, MD;  Location: [place];  Service: Orthopedics;  Laterality: Right;  . HIP CLOSED REDUCTION Right 10/15/2013   Procedure: CLOSED MANIPULATION HIP;  Surgeon: [name], MD;  [facility];  Service: Orthopedics;  Laterality: Right;  . hip revision     June 2017  . HOLMIUM LASER APPLICATION Right 5/0/2774   Procedure: HOLMIUM LASER APPLICATION;  Surgeon: Cleon Gustin, MD;  [facility];  Service: Urology;  Laterality: Right;  . INCISION AND DRAINAGE HIP Right 11/16/2013   Procedure: IRRIGATION AND DEBRIDEMENT RIGHT HIP;  Surgeon: [name], MD;  [facility];  Service: Orthopedics;  Laterality: Right;  . IR IMAGING GUIDED PORT INSERTION  06/22/2018  . IR URETERAL STENT LEFT NEW ACCESS W/O SEP NEPHROSTOMY CATH  10/24/2017  . JOINT REPLACEMENT     hip-right x2  . KNEE SURGERY Bilateral 2012   Total knee  replacements  . NEPHROLITHOTOMY Right 02/08/2017   Procedure: NEPHROLITHOTOMY PERCUTANEOUS WITH SURGEON ACCESS;  Surgeon: Cleon Gustin, MD;  [facility];  Service: Urology;  Laterality: Right;  . NEPHROLITHOTOMY Left 10/24/2017   Procedure: NEPHROLITHOTOMY PERCUTANEOUS;  Surgeon: Cleon Gustin, MD;  [facility];  Service: Urology;  Laterality: Left;  . ORIF TIBIA FRACTURE Right 02/06/2013   Procedure: OPEN REDUCTION INTERNAL FIXATION (ORIF) TIBIA FRACTURE WITH IM ROD FIBULA;  Surgeon: Rozanna Box, MD;  Location: [place];  Service: Orthopedics;  Laterality: Right;  . ORIF TIBIA FRACTURE Right 07/03/2013   Procedure: RIGHT TIBIA NON UNION REPAIR ;  Surgeon: Rozanna Box, MD;  Location: [place];  Service: Orthopedics;  Laterality: Right;  . ORIF WRIST FRACTURE  10/02/2012   Procedure: OPEN REDUCTION INTERNAL FIXATION (ORIF)  WRIST FRACTURE;  Surgeon: Roseanne Kaufman, MD;  [facility];  Service: Orthopedics;  Laterality: Right;  WITH   ANTIBIOTIC  CEMENT  . ORIF WRIST FRACTURE Left 10/28/2013   Procedure: OPEN REDUCTION INTERNAL FIXATION (ORIF) WRIST FRACTURE with allograft;  Surgeon: Roseanne Kaufman, MD;  [facility];  Service: Orthopedics;  Laterality: Left;  DVR Plate  . QUADRICEPS TENDON REPAIR Left 07/15/2017   Procedure: REPAIR QUADRICEP TENDON;  Surgeon: Frederik Pear, MD;  Location: [place];  Service: Orthopedics;  Laterality: Left;  . right femur surgery  05/2012  . THYROIDECTOMY  02/1986  . TOTAL HIP REVISION  10/05/2012   Procedure: TOTAL HIP REVISION;  Surgeon: [name], MD;  [facility];  Service: Orthopedics;  Laterality: Right;  RIGHT TOTAL HIP REVISION  . TOTAL HIP REVISION Right 09/17/2013   Procedure: REVISION RIGHT TOTAL HIP ARTHROPLASTY ;  Surgeon: [name], MD;  [facility];  Service: Orthopedics;  Laterality: Right;  . TOTAL HIP REVISION Right 10/26/2013   Procedure: REVISION RIGHT TOTAL HIP ARTHROPLASTY;  Surgeon: [name], MD;   [facility];  Service: Orthopedics;  Laterality: Right;  . TOTAL HIP REVISION  03/2016  . TOTAL KNEE REVISION Left 04/11/2017   Procedure: TOTAL KNEE REVISION PATELLA and TIBIA;  Surgeon: Frederik Pear, MD;  Location: [place];  Service: Orthopedics;  Laterality: Left;  . WRIST FRACTURE SURGERY  05/2012     Current Outpatient Medications  Medication Sig Dispense Refill  . alfuzosin (UROXATRAL) 10 MG 24 hr tablet Take 10 mg by mouth at bedtime.    [name] amoxicillin (AMOXIL) 500 MG capsule Take 2,000 mg by mouth See admin instructions. Takes 4 capsules 1 hour prior to dental procedures    . atorvastatin (LIPITOR) 80 MG tablet Take 80 mg by mouth daily.    [name] buPROPion (WELLBUTRIN XL) 300 MG 24 hr tablet TAKE 1 TABLET DAILY WITH  BREAKFAST BY MOUTH. (Patient taking differently: Take 300 mg by mouth daily. ) 90 tablet 3  . Calcium Citrate-Vitamin D (CALCIUM CITRATE + D PO) Take 1 tablet by mouth 2 (two) times daily.     . Cholecalciferol (VITAMIN D-3 PO) Take 2,000 Units by mouth 2 (two) times daily.     . clonazePAM (KLONOPIN) 1 MG tablet Take 1 tablet (1 mg total) by mouth every 6 (six) hours as needed for anxiety. TAKE 1 TABLET BY MOUTH  EVERY 6 HOURS prn 360 tablet 1  . denosumab (PROLIA) 60 MG/ML SOLN injection Inject 60 mg into the skin every 6 (six) months. Administer in upper arm, thigh, or abdomen    . diltiazem (CARDIZEM CD) 240 MG 24 hr capsule TAKE 1 CAPSULE BY MOUTH  DAILY 90 capsule 3  . doxycycline (VIBRA-TABS) 100 MG tablet TAKE 1 TABLET BY MOUTH  DAILY 90 tablet 1  . DULoxetine (CYMBALTA) 60 MG capsule TAKE 1 CAPSULE BY MOUTH  TWICE DAILY 180 capsule 1  . EPINEPHrine (EPIPEN 2-PAK) 0.3 mg/0.3 mL IJ SOAJ injection Inject 0.3 mg into the muscle daily as needed (for anaphylactic reaction).    [name] etanercept (ENBREL) 50 MG/ML injection Inject 50 mg into the skin 2 (two) times a week. Tuesday & Friday    . ferrous sulfate 325 (65 FE) MG tablet Take 325 mg by mouth 2 (two) times daily with a  meal.    . HYDROcodone-acetaminophen (NORCO) 7.5-325 MG tablet Take 1 tablet by mouth 4 (four) times daily. Please fill on 06/03/18 120 tablet 0  .  HYDROcodone-acetaminophen (NORCO) 7.5-325 MG tablet Take 1 tablet by mouth 4 (four) times daily as needed for moderate pain. Please fill on 07/03/18 120 tablet 0  . HYDROcodone-acetaminophen (NORCO) 7.5-325 MG tablet Take 1 tablet by mouth 4 (four) times daily as needed for moderate pain. Please fill on 08/02/18 120 tablet 0  . ipratropium (ATROVENT) 0.03 % nasal spray Place 1 spray into the nose daily as needed.    [name] levothyroxine (SYNTHROID, LEVOTHROID) 200 MCG tablet TAKE 1 TABLET DAILY AT 6 AM BY MOUTH. (Patient taking differently: Take 225 mcg by mouth daily before breakfast. ) 90 tablet 3  . levothyroxine (SYNTHROID, LEVOTHROID) 25 MCG tablet TAKE 1 TABLET BY MOUTH  DAILY BEFORE BREAKFAST. (Patient taking differently: Take 225 mcg by mouth daily before breakfast. ) 90 tablet 3  . lidocaine-prilocaine (EMLA) cream Apply to affected area once 30 g 3  . loratadine (CLARITIN) 10 MG tablet Take 10 mg by mouth daily.     [name] LORazepam (ATIVAN) 0.5 MG tablet Take 1 tablet (0.5 mg total) by mouth every 6 (six) hours as needed (Nausea or vomiting). 30 tablet 0  . mirabegron ER (MYRBETRIQ) 25 MG TB24 tablet Take 25 mg by mouth at bedtime.    . Multiple Vitamin (MULTIVITAMIN WITH MINERALS) TABS tablet Take 1 tablet by mouth daily. Men's One-A-Day 22+    . nitroGLYCERIN (NITROSTAT) 0.4 MG SL tablet Place 0.4 mg under the tongue every 5 (five) minutes as needed for chest pain. x3 doses as needed for chest pain    . omeprazole (PRILOSEC) 40 MG capsule TAKE 1 CAPSULE DAILY BEFORE BREAKFAST BY MOUTH. (Patient taking differently: Take 40 mg by mouth daily. ) 90 capsule 3  . ondansetron (ZOFRAN) 8 MG tablet Take 1 tablet (8 mg total) by mouth 2 (two) times daily as needed for refractory nausea / vomiting. chemotherapy. 30 tablet 1  . predniSONE (DELTASONE) 20 MG tablet  Take 3 tablets (60 mg total) by mouth daily. Take on days 1-5 of chemotherapy. 15 tablet 6  . PROAIR HFA 108 (90 Base) MCG/ACT inhaler Inhale 1 puff into the lungs every 4 (four) hours as needed.    . Probiotic Product (PROBIOTIC PO) Take 1 capsule by mouth daily with breakfast.    . prochlorperazine (COMPAZINE) 10 MG tablet Take 1 tablet (10 mg total) by mouth every 6 (six) hours as needed (Nausea or vomiting). 30 tablet 6  . testosterone enanthate (DELATESTRYL) 200 MG/ML injection Inject 1 mL (200 mg total) into the muscle every 14 (fourteen) days. For IM use only- discard vial after 28 days (Patient taking differently: Inject 100 mg into the muscle every 14 (fourteen) days. For IM use only- discard vial after 28 days) 5 mL 5  . tiZANidine (ZANAFLEX) 4 MG tablet TAKE 1 TABLET BY MOUTH  EVERY 6 HOURS AS NEEDED FOR MUSCLE SPASM(S) (Patient taking differently: Take 4 mg by mouth every 6 (six) hours as needed for muscle spasms. ) 360 tablet 1  . triamcinolone ointment (KENALOG) 0.5 % Apply 1 application topically 2 (two) times daily. To areas of psoriasis as instructed. 30 g 1  . vitamin C (ASCORBIC ACID) 500 MG tablet Take 500 mg by mouth daily.    Alveda Reasons 10 MG TABS tablet TAKE 1 TABLET DAILY BY  MOUTH. 90 tablet 3  . metoprolol tartrate (LOPRESSOR) 25 MG tablet Take 1 tablet (25 mg total) by mouth 2 (two) times daily. 60 tablet 3   No current facility-administered medications for this visit.  Allergies:   Short ragweed pollen ext    Social History:  The patient  reports that he quit smoking about 6 years ago. He has never used smokeless tobacco. He reports that he drinks alcohol. He reports that he does not use drugs.   Family History:  The patient's family history includes Alcohol abuse in his father and sister; Arthritis in his mother; Asthma in his father; CAD (age of onset: 74) in his father.    ROS:  Please see the history of present illness.   Otherwise, review of systems are  positive for none.   All other systems are reviewed and negative.    PHYSICAL EXAM: VS:  BP (!) 119/52   Pulse (!) 118   Ht 6\' 3"  (1.905 m)   Wt 299 lb 8 oz (135.9 kg)   BMI 37.43 kg/m  , BMI Body mass index is 37.43 kg/m. GENERAL:  Well appearing HEENT: Pupils equal round and reactive, fundi not visualized, oral mucosa unremarkable NECK:  No jugular venous distention, waveform within normal limits, carotid upstroke brisk and symmetric, no bruits, no thyromegaly LYMPHATICS:  No cervical adenopathy LUNGS:  Clear to auscultation bilaterally HEART:  Tachycardic.  Irregularly irregular.  PMI not displaced or sustained,S1 and S2 within normal limits, no S3, no S4, no clicks, no rubs, no murmurs ABD:  Flat, positive bowel sounds normal in frequency in pitch, no bruits, no rebound, no guarding, no midline pulsatile mass, no hepatomegaly, no splenomegaly EXT:  2 plus pulses throughout, no edema, no cyanosis no clubbing SKIN:  No rashes no nodules NEURO:  Cranial nerves II through XII grossly intact, motor grossly intact throughout PSYCH:  Cognitively intact, oriented to person place and time   EKG:  EKG is ordered today. The ekg ordered today demonstrates Atrial fibrillation. Rate 71 bpm. Left axis deviation. Nonspecific IVCD. 12/26/17: Atrial fibrillation.  Rate 95 bpm.  Cannot rule out prior anterior infarct. 07/04/18: Atrial fibrillation.  Rate 118 bpm.  Right axis deviation.   Echo 11/03/16: Study Conclusions  - Left ventricle: Systolic function was normal. The estimated   ejection fraction was in the range of 60% to 65%. The study is   not technically sufficient to allow evaluation of LV diastolic   function. - Left atrium: Severely dilated. - Right atrium: Severely dilated.  Impressions:  - Technically difficult study with poor echo windows. The LVEF is   grossly normal at 60-65%, there is severe biatrial enlargment,   the IVC is dilated.  Recent Labs: 08/16/2017: TSH  1.71 07/03/2018: ALT 41; BUN 26; Creatinine 1.26; Hemoglobin 15.5; Platelets 112; Potassium 4.6; Sodium 139    Lipid Panel    Component Value Date/Time   CHOL 163 12/29/2017 1138   TRIG 63 12/29/2017 1138   HDL 55 12/29/2017 1138   CHOLHDL 3.0 12/29/2017 1138   CHOLHDL 1.8 07/06/2013 1025   VLDL 10 07/06/2013 1025   LDLCALC 95 12/29/2017 1138      Wt Readings from Last 3 Encounters:  07/04/18 299 lb 8 oz (135.9 kg)  07/03/18 299 lb 11.2 oz (135.9 kg)  06/23/18 (!) 300 lb 11.2 oz (136.4 kg)      ASSESSMENT AND PLAN:  # Longstanding persistent atrial fibrillation: Rates are poorly controlled.  I suspect this may be why his systolic function is slightly worse on his recent echo.  He is asymptomatic.  We will continue diltiazem and start metoprolol tartrate 25 mill grams twice daily.  Continue Xarelto.  # CAD: # Hyperlipidemia:  [name] reportedly has obstructive coronary artery disease with evidence of collateralization. He has never experienced any chest pain and this catheterization was reportedly performed for atrial fibrillation.  Ranexa was discontinued and he remains chest pain free.  Continue atorvastatin.  The dose was increased 01/2018.  We will repeat lipids.  He is not on aspirin given that he is on Xarelto.  # Hypertension:  Blood pressure was initially elevated. However on repeat it was 122/64. Continue diltiazem.  Add metoprolol as above.  # Morbid obesity:  Encouraged him to keep up the exercise.   # OSA: Continue CPAP   Current medicines are reviewed at length with the patient today.  The patient does not have concerns regarding medicines.  The following changes have been made: Start metoprolol  Labs/ tests ordered today include:   Orders Placed This Encounter  Procedures  . Lipid panel     Disposition:   FU with Mackena Plummer C. Oval Linsey, MD, [facility] in 1 month.     Signed, Jackquline Branca C. Oval Linsey, MD, [facility]  07/04/2018 4:26 PM    [facility]

## 2018-07-09 ENCOUNTER — Encounter: Payer: Self-pay | Admitting: Hematology

## 2018-07-09 DIAGNOSIS — Z7189 Other specified counseling: Secondary | ICD-10-CM | POA: Insufficient documentation

## 2018-07-12 ENCOUNTER — Encounter: Payer: Self-pay | Admitting: Pharmacist

## 2018-07-12 NOTE — Progress Notes (Signed)
HEMATOLOGY/ONCOLOGY CLINIC NOTE  Date of Service: 07/13/2018  Patient Care Team: Plotnikov, Evie Lacks, MD as PCP - General (Internal Medicine)  CHIEF COMPLAINTS/PURPOSE OF CONSULTATION:  Follow Up for Diffuse Large B-Cell Lymphoma   HISTORY OF PRESENTING ILLNESS:   [name] is a wonderful 72 y.o. male who has been referred to [place] by Dr. Lew Dawes for evaluation and management of Diffuse Large B-Cell Lymphoma. He is accompanied today by his partner. The pt reports that he is doing well overall.   The pt reports that he presented to ENT Dr. Pollie Friar care after developing a cough and a new nodule in his mouth. He notes that the symptoms presented about a month ago and have not progressed or significantly worsened. He notes that he has frequent sinus drainage, and felt that this enlargement could initially be related to this. He denies any changes in his voice and difficulty breathing.   The pt notes that he is finishing three months of PT tomorrow after surgeries on his right leg and left knee. He notes that he takes blood thinners for Afib and uses his CPAP regularly now. The pt also receives testosterone injections, 200mg  every other week. The pt notes that he had several surgeries bilaterally to remove large kidney stones in the past. He notes that his thyroid replacement has been stable. His vertebral compression fracture was in 2013, after a fall in which he also broke his right femur and wrist. The pt also takes Prolia every 6 months for his osteoporosis. He has had 18 surgeries on his right leg.   He notes that his psoriasis has been stable and takes Enbrel twice a week, and has taken this for 5 years. He was on 10 years of Humara before this. He was on Methotrexate prior to [facility].   The pt notes that he functions okay at home and is able to drive himself and cook for himself and feels that he can function independently with limitations on lifting heavier objects.   On  review of systems, pt reports recent cough, left tonsil mass, stable energy levels, and denies fevers, chills, night sweats, unexpected weight loss, difficulty breathing, difficulty swallowing, noticing any other lumps or bumps, pain along the spine, recent infections, abdominal pains, difficulty urinating, leg swelling, and any other symptoms.  . On Social Hx the pt reports that he smoked cigarettes for many years until he quit 6 years ago, and drinks ETOH socially.    INTERVAL HISTORY   [name] is here for management, evaluation, and prior to C2 R-CHOP treatment of his Diffuse Large B-Cell Lymphoma. The patient's last visit with [place] was on 07/03/18. He is accompanied today by his partner. The pt reports that he is doing well overall.   The pt reports that he has made sure to get out of the house at least once a day, though he has felt a little tired. The pt notes that he has not developed any new concerns in the interim. The pt notes that his nausea has been well controlled and has not been an issue. He denies any concerns for infections.   The pt will follow up with cardiology on 08/17/18.   Lab results today (07/13/18) of CBC w/diff, CMP is as follows: all values are WNL except for RDW at 14.8, Potassium at 5.4, Glucose at 119, BUN at 28, Albumin at 3.2, GFR at 58. 07/13/18 Magnesium is WNL at 1.8  On review of systems, pt reports  feeling a little tired, and denies nausea, fevers, chills, night sweats, concerns for infections, mouth sores, back pains, abdominal pains, leg swelling, and any other symptoms.   MEDICAL HISTORY:  Past Medical History:  Diagnosis Date  . A-fib (HCC)    hx.- sinus rhythm now  . Acid reflux   . AKI (acute kidney injury) (HCC)    mild  . Anxiety   . Arthritis   . Atrial dilatation, bilateral    Severly, enlargement  per ECHO 11/03/16  . BPH (benign prostatic hyperplasia)   . CAD (coronary artery disease)   . Depression   . Dislocation of internal right  hip prosthesis, sequela   . Dysrhythmia    hx a fib  . Elevated brain natriuretic peptide (BNP) level   . Environmental allergies   . Fracture of wrist 10/23/13   LEFT  . High cholesterol   . History of Graves' disease   . History of kidney stones    surg. removal 02/2017  . History of syncope   . HLD (hyperlipidemia)   . Hypertension   . Hypothyroidism    thyroid removed  . Left knee injury    cap dislocated  . Low testosterone, possible hypogonadism 10/01/2012  . Lumbago   . Metabolic bone disease   . Multiple falls   . Osteoporosis    Severe  . Plaque psoriasis   . Sleep apnea    cpap machine-settings 17  . Thrombocytopenia ([place])   . Thyroid disease    HX GRAVES DISEASE  . Transfusion history    s/p 12'13 hip surgery  . Unsteady gait   . UTI (urinary tract infection)   . Vertebral compression fracture (HCC)    L1-wears brace    SURGICAL HISTORY: Past Surgical History:  Procedure Laterality Date  . APPENDECTOMY    . CARDIAC CATHETERIZATION  2013  . CATARACT EXTRACTION, BILATERAL    . COLONOSCOPY    . CYSTOSCOPY WITH RETROGRADE PYELOGRAM, URETEROSCOPY AND STENT PLACEMENT Left 11/07/2017   Procedure: CYSTOSCOPY WITH RETROGRADE PYELOGRAM, URETEROSCOPY AND STENT PLACEMENT;  Surgeon: Cleon Gustin, MD;  Location: [facility];  Service: Urology;  Laterality: Left;  . CYSTOSCOPY/URETEROSCOPY/HOLMIUM LASER/STENT PLACEMENT Right 03/11/2017   Procedure: CYSTOSCOPY/URETEROSCOPYSTENT PLACEMENT right ureter retrograde pylegram;  Surgeon: Cleon Gustin, MD;  Location: [facility];  Service: Urology;  Laterality: Right;  . HARDWARE REMOVAL  10/05/2012   Procedure: HARDWARE REMOVAL;  Surgeon: [name], MD;  Location: [facility];  Service: Orthopedics;  Laterality: Right;  REMOVING  STRYKER  GAMMA NAIL  . HARDWARE REMOVAL Right 07/03/2013   Procedure: HARDWARE REMOVAL RIGHT TIBIA ;  Surgeon: Rozanna Box, MD;  Location: [place];  Service: Orthopedics;  Laterality: Right;  . HIP  CLOSED REDUCTION Right 10/15/2013   Procedure: CLOSED MANIPULATION HIP;  Surgeon: [name], MD;  Location: [facility];  Service: Orthopedics;  Laterality: Right;  . hip revision     June 2017  . HOLMIUM LASER APPLICATION Right 0/0/1749   Procedure: HOLMIUM LASER APPLICATION;  Surgeon: Cleon Gustin, MD;  Location: [facility];  Service: Urology;  Laterality: Right;  . INCISION AND DRAINAGE HIP Right 11/16/2013   Procedure: IRRIGATION AND DEBRIDEMENT RIGHT HIP;  Surgeon: [name], MD;  Location: [facility];  Service: Orthopedics;  Laterality: Right;  . IR IMAGING GUIDED PORT INSERTION  06/22/2018  . IR URETERAL STENT LEFT NEW ACCESS W/O SEP NEPHROSTOMY CATH  10/24/2017  . JOINT REPLACEMENT     hip-right  x2  . KNEE SURGERY Bilateral 2012   Total knee replacements  . NEPHROLITHOTOMY Right 02/08/2017   Procedure: NEPHROLITHOTOMY PERCUTANEOUS WITH SURGEON ACCESS;  Surgeon: Cleon Gustin, MD;  [facility];  Service: Urology;  Laterality: Right;  . NEPHROLITHOTOMY Left 10/24/2017   Procedure: NEPHROLITHOTOMY PERCUTANEOUS;  Surgeon: Cleon Gustin, MD;  [facility];  Service: Urology;  Laterality: Left;  . ORIF TIBIA FRACTURE Right 02/06/2013   Procedure: OPEN REDUCTION INTERNAL FIXATION (ORIF) TIBIA FRACTURE WITH IM ROD FIBULA;  Surgeon: Rozanna Box, MD;  Location: [place];  Service: Orthopedics;  Laterality: Right;  . ORIF TIBIA FRACTURE Right 07/03/2013   Procedure: RIGHT TIBIA NON UNION REPAIR ;  Surgeon: Rozanna Box, MD;  Location: [place];  Service: Orthopedics;  Laterality: Right;  . ORIF WRIST FRACTURE  10/02/2012   Procedure: OPEN REDUCTION INTERNAL FIXATION (ORIF) WRIST FRACTURE;  Surgeon: Roseanne Kaufman, MD;  [facility];  Service: Orthopedics;  Laterality: Right;  WITH   ANTIBIOTIC  CEMENT  . ORIF WRIST FRACTURE Left 10/28/2013   Procedure: OPEN REDUCTION INTERNAL FIXATION (ORIF) WRIST FRACTURE with allograft;  Surgeon: Roseanne Kaufman, MD;  [facility];   Service: Orthopedics;  Laterality: Left;  DVR Plate  . QUADRICEPS TENDON REPAIR Left 07/15/2017   Procedure: REPAIR QUADRICEP TENDON;  Surgeon: Frederik Pear, MD;  Location: [place];  Service: Orthopedics;  Laterality: Left;  . right femur surgery  05/2012  . THYROIDECTOMY  02/1986  . TOTAL HIP REVISION  10/05/2012   Procedure: TOTAL HIP REVISION;  Surgeon: [name], MD;  [facility];  Service: Orthopedics;  Laterality: Right;  RIGHT TOTAL HIP REVISION  . TOTAL HIP REVISION Right 09/17/2013   Procedure: REVISION RIGHT TOTAL HIP ARTHROPLASTY ;  Surgeon: [name], MD;  [facility];  Service: Orthopedics;  Laterality: Right;  . TOTAL HIP REVISION Right 10/26/2013   Procedure: REVISION RIGHT TOTAL HIP ARTHROPLASTY;  Surgeon: [name], MD;  Location[facility];  Service: Orthopedics;  Laterality: Right;  . TOTAL HIP REVISION  03/2016  . TOTAL KNEE REVISION Left 04/11/2017   Procedure: TOTAL KNEE REVISION PATELLA and TIBIA;  Surgeon: Frederik Pear, MD;  Location: [place];  Service: Orthopedics;  Laterality: Left;  . WRIST FRACTURE SURGERY  05/2012    SOCIAL HISTORY: Social History   Socioeconomic History  . Marital status: Married    Spouse name: Not on file  . Number of children: Not on file  . Years of education: Not on file  . Highest education level: Not on file  Occupational History  . Not on file  Social Needs  . Financial resource strain: Not on file  . Food insecurity:    Worry: Not on file    Inability: Not on file  . Transportation needs:    Medical: Not on file    Non-medical: Not on file  Tobacco Use  . Smoking status: Former Smoker    Last attempt to quit: 06/05/2012    Years since quitting: 6.1  . Smokeless tobacco: Never Used  Substance and Sexual Activity  . Alcohol use: Yes    Comment: occasional-social  . Drug use: No  . Sexual activity: Yes  Lifestyle  . Physical activity:    Days per week: Not on file    Minutes per session: Not on file  .  Stress: Not on file  Relationships  . Social connections:    Talks on phone: Not on file  Gets together: Not on file    Attends religious service: Not on file    Active member of club or organization: Not on file    Attends meetings of clubs or organizations: Not on file    Relationship status: Not on file  . Intimate partner violence:    Fear of current or ex partner: Not on file    Emotionally abused: Not on file    Physically abused: Not on file    Forced sexual activity: Not on file  Other Topics Concern  . Not on file  Social History Narrative   [place] 2.5 months, went home Feb 21st slipped and fell on back and developed.  Home PT/OT.  Just started outpatient physical therapy.  Friday night, misstepped.      FAMILY HISTORY: Family History  Problem Relation Age of Onset  . CAD Father 60  . Asthma Father   . Alcohol abuse Father   . Arthritis Mother   . Alcohol abuse Sister     ALLERGIES:  is allergic to short ragweed pollen ext.  MEDICATIONS:  Current Outpatient Medications  Medication Sig Dispense Refill  . alfuzosin (UROXATRAL) 10 MG 24 hr tablet Take 10 mg by mouth at bedtime.    [name] amoxicillin (AMOXIL) 500 MG capsule Take 2,000 mg by mouth See admin instructions. Takes 4 capsules 1 hour prior to dental procedures    . atorvastatin (LIPITOR) 80 MG tablet Take 80 mg by mouth daily.    [name] buPROPion (WELLBUTRIN XL) 300 MG 24 hr tablet TAKE 1 TABLET DAILY WITH  BREAKFAST BY MOUTH. (Patient taking differently: Take 300 mg by mouth daily. ) 90 tablet 3  . Calcium Citrate-Vitamin D (CALCIUM CITRATE + D PO) Take 1 tablet by mouth 2 (two) times daily.     . Cholecalciferol (VITAMIN D-3 PO) Take 2,000 Units by mouth 2 (two) times daily.     . clonazePAM (KLONOPIN) 1 MG tablet Take 1 tablet (1 mg total) by mouth every 6 (six) hours as needed for anxiety. TAKE 1 TABLET BY MOUTH  EVERY 6 HOURS prn 360 tablet 1  . denosumab (PROLIA) 60 MG/ML SOLN injection Inject 60 mg into the  skin every 6 (six) months. Administer in upper arm, thigh, or abdomen    . diltiazem (CARDIZEM CD) 240 MG 24 hr capsule TAKE 1 CAPSULE BY MOUTH  DAILY 90 capsule 3  . doxycycline (VIBRA-TABS) 100 MG tablet TAKE 1 TABLET BY MOUTH  DAILY 90 tablet 1  . DULoxetine (CYMBALTA) 60 MG capsule TAKE 1 CAPSULE BY MOUTH  TWICE DAILY 180 capsule 1  . EPINEPHrine (EPIPEN 2-PAK) 0.3 mg/0.3 mL IJ SOAJ injection Inject 0.3 mg into the muscle daily as needed (for anaphylactic reaction).    [name] etanercept (ENBREL) 50 MG/ML injection Inject 50 mg into the skin 2 (two) times a week. Tuesday & Friday    . ferrous sulfate 325 (65 FE) MG tablet Take 325 mg by mouth 2 (two) times daily with a meal.    . HYDROcodone-acetaminophen (NORCO) 7.5-325 MG tablet Take 1 tablet by mouth 4 (four) times daily. Please fill on 06/03/18 120 tablet 0  . HYDROcodone-acetaminophen (NORCO) 7.5-325 MG tablet Take 1 tablet by mouth 4 (four) times daily as needed for moderate pain. Please fill on 07/03/18 120 tablet 0  . HYDROcodone-acetaminophen (NORCO) 7.5-325 MG tablet Take 1 tablet by mouth 4 (four) times daily as needed for moderate pain. Please fill on 08/02/18 120 tablet 0  . ipratropium (ATROVENT) 0.03 %  nasal spray Place 1 spray into the nose daily as needed.    [name] levothyroxine (SYNTHROID, LEVOTHROID) 200 MCG tablet TAKE 1 TABLET DAILY AT 6 AM BY MOUTH. (Patient taking differently: Take 225 mcg by mouth daily before breakfast. ) 90 tablet 3  . levothyroxine (SYNTHROID, LEVOTHROID) 25 MCG tablet TAKE 1 TABLET BY MOUTH  DAILY BEFORE BREAKFAST. (Patient taking differently: Take 225 mcg by mouth daily before breakfast. ) 90 tablet 3  . lidocaine-prilocaine (EMLA) cream Apply to affected area once 30 g 3  . loratadine (CLARITIN) 10 MG tablet Take 10 mg by mouth daily.     [name] LORazepam (ATIVAN) 0.5 MG tablet Take 1 tablet (0.5 mg total) by mouth every 6 (six) hours as needed (Nausea or vomiting). 30 tablet 0  . metoprolol tartrate (LOPRESSOR) 25  MG tablet Take 1 tablet (25 mg total) by mouth 2 (two) times daily. 60 tablet 3  . mirabegron ER (MYRBETRIQ) 25 MG TB24 tablet Take 25 mg by mouth at bedtime.    . Multiple Vitamin (MULTIVITAMIN WITH MINERALS) TABS tablet Take 1 tablet by mouth daily. Men's One-A-Day 5+    . nitroGLYCERIN (NITROSTAT) 0.4 MG SL tablet Place 0.4 mg under the tongue every 5 (five) minutes as needed for chest pain. x3 doses as needed for chest pain    . omeprazole (PRILOSEC) 40 MG capsule TAKE 1 CAPSULE DAILY BEFORE BREAKFAST BY MOUTH. (Patient taking differently: Take 40 mg by mouth daily. ) 90 capsule 3  . ondansetron (ZOFRAN) 8 MG tablet Take 1 tablet (8 mg total) by mouth 2 (two) times daily as needed for refractory nausea / vomiting. chemotherapy. 30 tablet 1  . predniSONE (DELTASONE) 20 MG tablet Take 3 tablets (60 mg total) by mouth daily. Take on days 1-5 of chemotherapy. 15 tablet 6  . PROAIR HFA 108 (90 Base) MCG/ACT inhaler Inhale 1 puff into the lungs every 4 (four) hours as needed.    . Probiotic Product (PROBIOTIC PO) Take 1 capsule by mouth daily with breakfast.    . prochlorperazine (COMPAZINE) 10 MG tablet Take 1 tablet (10 mg total) by mouth every 6 (six) hours as needed (Nausea or vomiting). 30 tablet 6  . testosterone enanthate (DELATESTRYL) 200 MG/ML injection Inject 1 mL (200 mg total) into the muscle every 14 (fourteen) days. For IM use only- discard vial after 28 days (Patient taking differently: Inject 100 mg into the muscle every 14 (fourteen) days. For IM use only- discard vial after 28 days) 5 mL 5  . tiZANidine (ZANAFLEX) 4 MG tablet TAKE 1 TABLET BY MOUTH  EVERY 6 HOURS AS NEEDED FOR MUSCLE SPASM(S) (Patient taking differently: Take 4 mg by mouth every 6 (six) hours as needed for muscle spasms. ) 360 tablet 1  . triamcinolone ointment (KENALOG) 0.5 % Apply 1 application topically 2 (two) times daily. To areas of psoriasis as instructed. 30 g 1  . vitamin C (ASCORBIC ACID) 500 MG tablet Take  500 mg by mouth daily.    Alveda Reasons 10 MG TABS tablet TAKE 1 TABLET DAILY BY  MOUTH. 90 tablet 3   No current facility-administered medications for this visit.     REVIEW OF SYSTEMS:    A 10+ POINT REVIEW OF SYSTEMS WAS OBTAINED including neurology, dermatology, psychiatry, cardiac, respiratory, lymph, extremities, GI, GU, Musculoskeletal, constitutional, breasts, reproductive, HEENT.  All pertinent positives are noted in the HPI.  All others are negative.   PHYSICAL EXAMINATION: ECOG PERFORMANCE STATUS: 2 - Symptomatic, <50% confined  to bed  . Vitals:   07/13/18 1127  BP: (!) 131/115  Pulse: 66  Resp: 18  Temp: 97.8 F (36.6 C)  SpO2: 100%   Filed Weights   07/13/18 1127  Weight: 298 lb 11.2 oz (135.5 kg)   .Body mass index is 37.33 kg/m.  GENERAL:alert, in no acute distress and comfortable SKIN: chronic venous stasis changes in BLE, psoriasis rashes on elbows EYES: conjunctiva are pink and non-injected, sclera anicteric OROPHARYNX: enlarged left tonsil with erythema extending up to midline in the posterior cavity  NECK: supple, no JVD LYMPH:  no palpable lymphadenopathy in the axillary or inguinal regions (+) couple palpable enlarged cervical lymph nodes b/l LUNGS: clear to auscultation b/l with normal respiratory effort HEART: regular rate & rhythm ABDOMEN:  normoactive bowel sounds , non tender, not distended. No palpable hepatosplenomegaly.  Extremity: no pedal edema PSYCH: alert & oriented x 3 with fluent speech NEURO: no focal motor/sensory deficits   LABORATORY DATA:  I have reviewed the data as listed  . CBC Latest Ref Rng & Units 07/13/2018 07/03/2018 06/23/2018  WBC 4.0 - 10.3 K/uL 7.0 4.9 6.0  Hemoglobin 13.0 - 17.1 g/dL 15.9 15.5 15.7  Hematocrit 38.4 - 49.9 % 47.6 47.3 47.5  Platelets 140 - 400 K/uL 190 112(L) 143    . CMP Latest Ref Rng & Units 07/13/2018 07/03/2018 06/23/2018  Glucose 70 - 99 mg/dL 119(H) 134(H) 107(H)  BUN 8 - 23 mg/dL 28(H) 26(H)  28(H)  Creatinine 0.61 - 1.24 mg/dL 1.21 1.26(H) 1.33(H)  Sodium 135 - 145 mmol/L 138 139 139  Potassium 3.5 - 5.1 mmol/L 5.4(H) 4.6 4.6  Chloride 98 - 111 mmol/L 103 102 103  CO2 22 - 32 mmol/L 30 26 27   Calcium 8.9 - 10.3 mg/dL 9.0 9.0 9.2  Total Protein 6.5 - 8.1 g/dL 7.3 6.4(L) 7.4  Total Bilirubin 0.3 - 1.2 mg/dL 0.5 0.5 0.9  Alkaline Phos 38 - 126 U/L 72 69 75  AST 15 - 41 U/L 21 20 24   ALT 0 - 44 U/L 32 41 28   05/29/18 Left tonsil biopsy:   RADIOGRAPHIC STUDIES: I have personally reviewed the radiological images as listed and agreed with the findings in the report. Nm Pet Image Initial (pi) Skull Base To Thigh  Result Date: 06/16/2018 CLINICAL DATA:  Initial treatment strategy for B-cell lymphoma. EXAM: NUCLEAR MEDICINE PET SKULL BASE TO THIGH TECHNIQUE: 15.3 mCi F-18 FDG was injected intravenously. Full-ring PET imaging was performed from the skull base to thigh after the radiotracer. CT data was obtained and used for attenuation correction and anatomic localization. Fasting blood glucose: 94 mg/dl COMPARISON:  Multiple exams, including CT abdomen from 03/10/2018 FINDINGS: Mediastinal blood pool activity: SUV max 2.5 Background hepatic parenchymal activity: SUV max 3.4 NECK: Left palatine tonsillar mass 3.1 cm in diameter, maximum SUV 19.8. This may extend to involve the left lingual tonsil/left tongue base. Hypermetabolic right tonsillar activity with maximum SUV 9.8, measuring 1.8 cm in long axis. Left level IIa lymph node measures 1.0 cm in short axis on image 33/4 with maximum SUV 11.7. Left level IIb lymph node measures 1.0 cm in short axis on image 32/4 with maximum SUV 13.0. A presumed lymph node in the subcutaneous tissues superficial to the right lower sternocleidomastoid muscle measures 2.3 cm in short axis on image 41/4 and has a maximum SUV of 2.9. Accentuated activity in the nose is likely incidental. Incidental CT findings: Bilateral common carotid atherosclerotic  calcification. CHEST: No significant  abnormal hypermetabolic activity in this region. Incidental CT findings: Coronary, aortic arch, and branch vessel atherosclerotic vascular disease. Paraseptal emphysema. ABDOMEN/PELVIS: No splenomegaly or focal splenic lesion. A right inguinal lymph node measuring 1.4 cm in short axis on image 219/4 has a maximum SUV of 13.3. A left inguinal lymph node measuring 9 mm in short axis on image 220/4 has a maximum SUV of 4.4. Incidental CT findings: Bilateral nonobstructive nephrolithiasis. Aortoiliac atherosclerotic vascular disease. Umbilical hernia contains adipose tissues. SKELETON: There is a right total hip prosthesis with cerclage in somewhat exuberant bone along the proximal stem, in addition to intermediate density along the proximal stem above the bony region with accentuated metabolic activity, maximum SUV in this vicinity 13.4. Strictly speaking I cannot exclude lymphomatous involvement in this region. Incidental CT findings: Old right rib fractures, some of which are nonunited. Several scattered small sclerotic lesions in the skeleton are nonspecific but not appreciably hypermetabolic. 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 IMPRESSION: 1. Deauville 5 hypermetabolic lesions in the palatine tonsils, left internal jugular chain, and involving a right inguinal lymph node. Deauville 4 involvement of a left inguinal lymph node and an enlarged subcutaneous lesion favoring lymph node superficial to the lower right sternocleidomastoid muscle in the neck. 2. Other imaging findings of potential clinical significance: Aortic Atherosclerosis (ICD10-I70.0). Coronary atherosclerosis. Emphysema (ICD10-J43.9). Bilateral nonobstructive nephrolithiasis. Umbilical hernia contains adipose tissues. Old right rib fractures some of which are nonunited. 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 Electronically Signed   By: Van Clines M.D.   On: 06/16/2018 19:33   Ir Imaging Guided Port Insertion  Result Date:  06/22/2018 INDICATION: 72 year old male with a history large B-cell lymphoma EXAM: IMPLANTED PORT A CATH PLACEMENT WITH ULTRASOUND AND FLUOROSCOPIC GUIDANCE MEDICATIONS: 2.0 g Ancef; The antibiotic was administered within an appropriate time interval prior to skin puncture. ANESTHESIA/SEDATION: Moderate (conscious) sedation was employed during this procedure. A total of Versed 1.5 mg and Fentanyl 75 mcg was administered intravenously. Moderate Sedation Time: 5 20 minutes. The patient's level of consciousness and vital signs were monitored continuously by radiology nursing throughout the procedure under my direct supervision. FLUOROSCOPY TIME:  0 minutes, 18  seconds (1.0 mGy) COMPLICATIONS: None PROCEDURE: The procedure, risks, benefits, and alternatives were explained to the patient. Questions regarding the procedure were encouraged and answered. The patient understands and consents to the procedure. Ultrasound survey was performed with images stored and sent to PACs. The right neck and chest was prepped with chlorhexidine, and draped in the usual sterile fashion using maximum barrier technique (cap and mask, sterile gown, sterile gloves, large sterile sheet, hand hygiene and cutaneous antiseptic). Antibiotic prophylaxis was provided with 2.0g Ancef administered IV one hour prior to skin incision. Local anesthesia was attained by infiltration with 1% lidocaine without epinephrine. Ultrasound demonstrated patency of the right internal jugular vein, and this was documented with an image. Under real-time ultrasound guidance, this vein was accessed with a 21 gauge micropuncture needle and image documentation was performed. A small dermatotomy was made at the access site with an 11 scalpel. A 0.018" wire was advanced into the SVC and used to estimate the length of the internal catheter. The access needle exchanged for a 50F micropuncture vascular sheath. The 0.018" wire was then removed and a 0.035" wire advanced into  the IVC. An appropriate location for the subcutaneous reservoir was selected. Patient requested a location of the port apparatus lateral to and existing tattoo on the right chest. After local anesthesia, incision was made. The subcutaneous tissues were then dissected  using a combination of blunt and sharp surgical technique and a pocket was formed. A single lumen power injectable portacatheter was then tunneled through the subcutaneous tissues from the pocket to the dermatotomy and the port reservoir placed within the subcutaneous pocket. The venous access site was then serially dilated and a peel away vascular sheath placed over the wire. The wire was removed and the port catheter advanced into position under fluoroscopic guidance. The catheter tip is positioned in the cavoatrial junction. This was documented with a spot image. The portacatheter was then tested and found to flush and aspirate well. The port was flushed with saline followed by 100 units/mL heparinized saline. The pocket was then closed in two layers using first subdermal inverted interrupted absorbable sutures followed by a running subcuticular suture. The epidermis was then sealed with Dermabond. The dermatotomy at the venous access site was also seal with Dermabond. Patient tolerated the procedure well and remained hemodynamically stable throughout. No complications encountered and no significant blood loss encountered IMPRESSION: Status post right IJ port catheter. Signed, Dulcy Fanny. Dellia Nims, RPVI [facility] Radiology Electronically Signed   By: Corrie Mckusick D.O.   On: 06/22/2018 13:19    ASSESSMENT & PLAN:  72 y.o. male with  1. Diffuse Large B-Cell Lymphoma,- Stage III Left tonsil -05/29/18 Left tonsil biopsy which revealed Diffuse Large B-Cell Lymphoma, which requires treatment  -06/07/18 LDH at 214, will monitor change through treatment.  -Baseline ECHO 06/14/18: EF 50-55% -Standard  regimen of R-CHOP with G-CSF support for 6 cycles starting 06/23/18  06/16/18 PET/CT revealed 1. Deauville 5 hypermetabolic lesions in the palatine tonsils, left internal jugular chain, and involving a right inguinal lymph node. Deauville 4 involvement of a left inguinal lymph node and an enlarged subcutaneous lesion favoring lymph node superficial to the lower right sternocleidomastoid muscle in the neck. 2. Other imaging findings of potential clinical significance: Aortic Atherosclerosis. Coronary atherosclerosis. Emphysema. Bilateral nonobstructive nephrolithiasis. Umbilical hernia contains adipose tissues. Old right rib fractures some of which are nonunited. 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   PLAN:  -Recommend that the pt receive annual flu vaccination -Pt has tolerated C1 well  -Recommended salt and baking soda mouthwashes 3-4 times each day -Will continue to monitor mild neuropathy in fingertips -Recommend Claritin and Tylenol for mild bone pains related to neulasta, pt will let me know if he develops worsening bone pains  -Pt holding Enbrel and testosterone since last visit. I previously prescribed Kenalog for topical use for now. If needed he may restart Testosterone at low dose. Will monitor.  -Discussed pt labwork today, 07/13/18; potassium a little high, blood counts and chemistries are otherwise stable  -Recommended that the pt stay well hydrated with hyperkalemia  -Recommended that the pt continue to eat well, drink at least 48-64 oz of water each day, and walk 20-30 minutes each day.   -The pt has no prohibitive toxicities from continuing C2 R-CHOP with G-CSF support at this time. -Will see the pt back at C3D1   2.  Patient Active Problem List   Diagnosis Date Noted  . Counseling regarding advance care planning and goals of care 07/09/2018  . Diffuse large B-cell lymphoma ([place]) 06/23/2018  . Port-A-Cath in place 06/23/2018  . Hypogonadism in male 11/22/2017  . Nephrolithiasis 10/24/2017   . Recurrent dislocation of left patella 07/15/2017  . Recurrent subluxation of patella, left 07/08/2017  . S/P revision of total knee 04/11/2017  . Failed total knee, left, initial encounter (  [place]) 04/09/2017  . Renal calculus, right 02/08/2017  . Protein-calorie malnutrition, severe ([place]) 12/08/2016  . Chronic obstructive pulmonary disease ([place]) 12/08/2016  . Staghorn renal calculus 11/05/2016  . Wound drainage right hip 11/15/2013  . Expected blood loss anemia 09/18/2013  . Hyponatremia 09/18/2013  . S/P right TH revision 09/17/2013  . Nonunion, fracture, Right tibia  07/03/2013  . UTI (urinary tract infection) 07/03/2013  . Pathological fracture due to osteoporosis with delayed healing 04/24/2013  . Osteoporosis with fracture 04/24/2013  . Rash and nonspecific skin eruption 03/08/2013  . Syncope 02/21/2013  . PAF (paroxysmal atrial fibrillation) ([place]) 02/21/2013  . Chronic anticoagulation 02/21/2013  . Fall 02/08/2013  . Physical deconditioning 02/08/2013  . Low back pain radiating to both legs 02/08/2013  . Compression fracture of L1 lumbar vertebra ([facility]) 02/08/2013  . Right tibial fracture 02/05/2013  . Obesity, unspecified 02/05/2013  . Osteomyelitis of right wrist ([place]) 11/02/2012  . Anemia 10/06/2012  . HTN (hypertension) 10/03/2012  . Psoriasis 10/03/2012  . Dyslipidemia 10/03/2012  . Vitamin D deficiency 10/03/2012  . GERD (gastroesophageal reflux disease) 10/03/2012  . Hyperglycemia 10/03/2012  . Anxiety 10/03/2012  . Depression 10/03/2012  . CAD (coronary artery disease) 10/03/2012  . DJD (degenerative joint disease) 10/03/2012  . Chronic gingivitis 10/03/2012  . Low testosterone, possible hypogonadism 10/01/2012  . Normocytic anemia 09/30/2012  . Right femoral fracture ([place]) 09/29/2012  . Right forearm fracture 09/29/2012  . Atrial fibrillation ([place]) 09/29/2012  . Hypothyroidism 09/29/2012  . History of recurrent UTIs 09/29/2012   -continue mx of medical  co-morbidities with PCP   F/u for C2 of R-CHOP with neulasta as scheduled tomorrow Please schedule C3 of R-CHOP with neulasta in 3 weeks with labs and MD visit   All of the patients questions were answered with apparent satisfaction. The patient knows to call the clinic with any problems, questions or concerns.  The total time spent in the appt was 25 minutes and more than 50% was on counseling and direct patient cares.     Sullivan Lone MD MS AAHIVMS [facility] Hematology/Oncology Physician [facility]  (Office):       [phone] (Work cell):  [phone] (Fax):           [phone]  07/13/2018 12:19 PM  I, Baldwin [place], am acting as a scribe for Dr. Irene Limbo  .I have reviewed the above documentation for accuracy and completeness, and I agree with the above. Brunetta Genera MD

## 2018-07-13 ENCOUNTER — Encounter: Payer: Self-pay | Admitting: Hematology

## 2018-07-13 ENCOUNTER — Inpatient Hospital Stay: Payer: Medicare Other | Attending: Hematology

## 2018-07-13 ENCOUNTER — Inpatient Hospital Stay (HOSPITAL_BASED_OUTPATIENT_CLINIC_OR_DEPARTMENT_OTHER): Payer: Medicare Other | Admitting: Hematology

## 2018-07-13 ENCOUNTER — Other Ambulatory Visit: Payer: Self-pay | Admitting: Cardiovascular Disease

## 2018-07-13 VITALS — BP 131/115 | HR 66 | Temp 97.8°F | Resp 18 | Ht 75.0 in | Wt 298.7 lb

## 2018-07-13 DIAGNOSIS — Z23 Encounter for immunization: Secondary | ICD-10-CM | POA: Insufficient documentation

## 2018-07-13 DIAGNOSIS — Z5111 Encounter for antineoplastic chemotherapy: Secondary | ICD-10-CM | POA: Insufficient documentation

## 2018-07-13 DIAGNOSIS — Z5112 Encounter for antineoplastic immunotherapy: Secondary | ICD-10-CM | POA: Insufficient documentation

## 2018-07-13 DIAGNOSIS — C8339 Diffuse large B-cell lymphoma, extranodal and solid organ sites: Secondary | ICD-10-CM

## 2018-07-13 DIAGNOSIS — Z5189 Encounter for other specified aftercare: Secondary | ICD-10-CM | POA: Diagnosis not present

## 2018-07-13 DIAGNOSIS — C8330 Diffuse large B-cell lymphoma, unspecified site: Secondary | ICD-10-CM

## 2018-07-13 LAB — CBC WITH DIFFERENTIAL/PLATELET
Basophils Absolute: 0.1 10*3/uL (ref 0.0–0.1)
Basophils Relative: 1 %
EOS ABS: 0.1 10*3/uL (ref 0.0–0.5)
EOS PCT: 1 %
HCT: 47.6 % (ref 38.4–49.9)
Hemoglobin: 15.9 g/dL (ref 13.0–17.1)
LYMPHS ABS: 1.4 10*3/uL (ref 0.9–3.3)
Lymphocytes Relative: 20 %
MCH: 30.4 pg (ref 27.2–33.4)
MCHC: 33.4 g/dL (ref 32.0–36.0)
MCV: 91.2 fL (ref 79.3–98.0)
MONOS PCT: 8 %
Monocytes Absolute: 0.6 10*3/uL (ref 0.1–0.9)
Neutro Abs: 4.9 10*3/uL (ref 1.5–6.5)
Neutrophils Relative %: 70 %
PLATELETS: 190 10*3/uL (ref 140–400)
RBC: 5.21 MIL/uL (ref 4.20–5.82)
RDW: 14.8 % — ABNORMAL HIGH (ref 11.0–14.6)
WBC: 7 10*3/uL (ref 4.0–10.3)

## 2018-07-13 LAB — CMP (CANCER CENTER ONLY)
ALBUMIN: 3.2 g/dL — AB (ref 3.5–5.0)
ALK PHOS: 72 U/L (ref 38–126)
ALT: 32 U/L (ref 0–44)
AST: 21 U/L (ref 15–41)
Anion gap: 5 (ref 5–15)
BUN: 28 mg/dL — ABNORMAL HIGH (ref 8–23)
CALCIUM: 9 mg/dL (ref 8.9–10.3)
CHLORIDE: 103 mmol/L (ref 98–111)
CO2: 30 mmol/L (ref 22–32)
Creatinine: 1.21 mg/dL (ref 0.61–1.24)
GFR, Est AFR Am: >60 mL/min (ref >60)
GFR, Estimated: 58 mL/min — ABNORMAL LOW (ref >60)
GLUCOSE: 119 mg/dL — AB (ref 70–99)
POTASSIUM: 5.4 mmol/L — AB (ref 3.5–5.1)
SODIUM: 138 mmol/L (ref 135–145)
Total Bilirubin: 0.5 mg/dL (ref 0.3–1.2)
Total Protein: 7.3 g/dL (ref 6.5–8.1)

## 2018-07-13 LAB — MAGNESIUM: Magnesium: 1.8 mg/dL (ref 1.7–2.4)

## 2018-07-13 NOTE — Telephone Encounter (Signed)
Patient agreed to come in at 8am tomorrow instead of 845 am.

## 2018-07-14 ENCOUNTER — Inpatient Hospital Stay: Payer: Medicare Other

## 2018-07-14 ENCOUNTER — Other Ambulatory Visit: Payer: Self-pay | Admitting: Hematology

## 2018-07-14 VITALS — BP 123/65 | HR 96 | Temp 98.2°F | Resp 18

## 2018-07-14 DIAGNOSIS — C8330 Diffuse large B-cell lymphoma, unspecified site: Secondary | ICD-10-CM

## 2018-07-14 DIAGNOSIS — Z5112 Encounter for antineoplastic immunotherapy: Secondary | ICD-10-CM | POA: Diagnosis not present

## 2018-07-14 DIAGNOSIS — Z7189 Other specified counseling: Secondary | ICD-10-CM

## 2018-07-14 MED ORDER — SODIUM CHLORIDE 0.9 % IV SOLN: 375.0000 mg/m2 | Freq: Once | INTRAVENOUS | Status: DC

## 2018-07-14 MED ORDER — SODIUM CHLORIDE 0.9% FLUSH
10.0000 mL | INTRAVENOUS | Status: DC | PRN
  Administered 2018-07-14: 10 mL
  Filled 2018-07-14: qty 10

## 2018-07-14 MED ORDER — DIPHENHYDRAMINE HCL 25 MG PO CAPS
ORAL_CAPSULE | ORAL | Status: AC
  Filled 2018-07-14: qty 2

## 2018-07-14 MED ORDER — ACETAMINOPHEN 325 MG PO TABS
650.0000 mg | ORAL_TABLET | Freq: Once | ORAL | Status: AC
  Administered 2018-07-14: 650 mg via ORAL

## 2018-07-14 MED ORDER — PALONOSETRON HCL INJECTION 0.25 MG/5ML
0.2500 mg | Freq: Once | INTRAVENOUS | Status: AC
  Administered 2018-07-14: 0.25 mg via INTRAVENOUS

## 2018-07-14 MED ORDER — DEXAMETHASONE SODIUM PHOSPHATE 10 MG/ML IJ SOLN
10.0000 mg | Freq: Once | INTRAMUSCULAR | Status: AC
  Administered 2018-07-14: 10 mg via INTRAVENOUS

## 2018-07-14 MED ORDER — DIPHENHYDRAMINE HCL 25 MG PO CAPS
50.0000 mg | ORAL_CAPSULE | Freq: Once | ORAL | Status: AC
  Administered 2018-07-14: 50 mg via ORAL

## 2018-07-14 MED ORDER — ACETAMINOPHEN 325 MG PO TABS
ORAL_TABLET | ORAL | Status: AC
  Filled 2018-07-14: qty 2

## 2018-07-14 MED ORDER — DOXORUBICIN HCL CHEMO IV INJECTION 2 MG/ML
50.0000 mg/m2 | Freq: Once | INTRAVENOUS | Status: AC
  Administered 2018-07-14: 134 mg via INTRAVENOUS
  Filled 2018-07-14: qty 67

## 2018-07-14 MED ORDER — HEPARIN SOD (PORK) LOCK FLUSH 100 UNIT/ML IV SOLN
500.0000 [IU] | Freq: Once | INTRAVENOUS | Status: AC | PRN
  Administered 2018-07-14: 500 [IU]
  Filled 2018-07-14: qty 5

## 2018-07-14 MED ORDER — SODIUM CHLORIDE 0.9 % IV SOLN
375.0000 mg/m2 | Freq: Once | INTRAVENOUS | Status: AC
  Administered 2018-07-14: 1000 mg via INTRAVENOUS
  Filled 2018-07-14: qty 100

## 2018-07-14 MED ORDER — SODIUM CHLORIDE 0.9 % IV SOLN
Freq: Once | INTRAVENOUS | Status: AC
  Administered 2018-07-14: 09:00:00 via INTRAVENOUS
  Filled 2018-07-14: qty 250

## 2018-07-14 MED ORDER — DEXAMETHASONE SODIUM PHOSPHATE 10 MG/ML IJ SOLN
INTRAMUSCULAR | Status: AC
  Filled 2018-07-14: qty 1

## 2018-07-14 MED ORDER — VINCRISTINE SULFATE CHEMO INJECTION 1 MG/ML
2.0000 mg | Freq: Once | INTRAVENOUS | Status: AC
  Administered 2018-07-14: 2 mg via INTRAVENOUS
  Filled 2018-07-14: qty 2

## 2018-07-14 MED ORDER — PALONOSETRON HCL INJECTION 0.25 MG/5ML
INTRAVENOUS | Status: AC
  Filled 2018-07-14: qty 5

## 2018-07-14 MED ORDER — SODIUM CHLORIDE 0.9 % IV SOLN
745.0000 mg/m2 | Freq: Once | INTRAVENOUS | Status: AC
  Administered 2018-07-14: 2000 mg via INTRAVENOUS
  Filled 2018-07-14: qty 100

## 2018-07-14 NOTE — Patient Instructions (Signed)
[place] Discharge Instructions for Patients Receiving Chemotherapy  Today you received the following chemotherapy agents doxorubicin (Adriamycin), vincristine (Oncovin), cyclophosphamide (Cytoxan) and rituximab (Rituxan)   To help prevent nausea and vomiting after your treatment, we encourage you to take your nausea medication as directed. No Zofran for three days, take Compazine instead.    If you develop nausea and vomiting that is not controlled by your nausea medication, call the clinic.   BELOW ARE SYMPTOMS THAT SHOULD BE REPORTED IMMEDIATELY:  *FEVER GREATER THAN 100.5 F  *CHILLS WITH OR WITHOUT FEVER  NAUSEA AND VOMITING THAT IS NOT CONTROLLED WITH YOUR NAUSEA MEDICATION  *UNUSUAL SHORTNESS OF BREATH  *UNUSUAL BRUISING OR BLEEDING  TENDERNESS IN MOUTH AND THROAT WITH OR WITHOUT PRESENCE OF ULCERS  *URINARY PROBLEMS  *BOWEL PROBLEMS  UNUSUAL RASH Items with * indicate a potential emergency and should be followed up as soon as possible.  Feel free to call the clinic should you have any questions or concerns. The clinic phone number is [phone].  Please show the [place] at check-in to the Emergency Department and triage nurse.

## 2018-07-14 NOTE — Progress Notes (Signed)
Received ok to change patient to Rapid Rituxan due to no issues with cycle #1.  Orders adjusted in Epic.  T.O. Dr Cleda Clarks, PharmD

## 2018-07-17 ENCOUNTER — Inpatient Hospital Stay: Payer: Medicare Other

## 2018-07-17 VITALS — BP 148/71 | HR 75 | Temp 98.3°F | Resp 17

## 2018-07-17 DIAGNOSIS — C8330 Diffuse large B-cell lymphoma, unspecified site: Secondary | ICD-10-CM

## 2018-07-17 DIAGNOSIS — Z5112 Encounter for antineoplastic immunotherapy: Secondary | ICD-10-CM | POA: Diagnosis not present

## 2018-07-17 DIAGNOSIS — Z7189 Other specified counseling: Secondary | ICD-10-CM

## 2018-07-17 LAB — LIPID PANEL W/O CHOL/HDL RATIO
Cholesterol, Total: 144 mg/dL (ref 100–199)
HDL: 47 mg/dL (ref >39)
LDL Calculated: 83 mg/dL (ref 0–99)
TRIGLYCERIDES: 72 mg/dL (ref 0–149)
VLDL CHOLESTEROL CAL: 14 mg/dL (ref 5–40)

## 2018-07-17 MED ORDER — PEGFILGRASTIM-CBQV 6 MG/0.6ML ~~LOC~~ SOSY
PREFILLED_SYRINGE | SUBCUTANEOUS | Status: AC
  Filled 2018-07-17: qty 0.6

## 2018-07-17 MED ORDER — INFLUENZA VAC SPLIT QUAD 0.5 ML IM SUSY
0.5000 mL | PREFILLED_SYRINGE | Freq: Once | INTRAMUSCULAR | Status: AC
  Administered 2018-07-17: 0.5 mL via INTRAMUSCULAR

## 2018-07-17 MED ORDER — INFLUENZA VAC SPLIT QUAD 0.5 ML IM SUSY
PREFILLED_SYRINGE | INTRAMUSCULAR | Status: AC
  Filled 2018-07-17: qty 0.5

## 2018-07-17 MED ORDER — PEGFILGRASTIM-CBQV 6 MG/0.6ML ~~LOC~~ SOSY
6.0000 mg | PREFILLED_SYRINGE | Freq: Once | SUBCUTANEOUS | Status: AC
  Administered 2018-07-17: 6 mg via SUBCUTANEOUS

## 2018-07-17 NOTE — Patient Instructions (Signed)
Pegfilgrastim injection What is this medicine? PEGFILGRASTIM (PEG fil gra stim) is a long-acting granulocyte colony-stimulating factor that stimulates the growth of neutrophils, a type of white blood cell important in the body's fight against infection. It is used to reduce the incidence of fever and infection in patients with certain types of cancer who are receiving chemotherapy that affects the bone marrow, and to increase survival after being exposed to high doses of radiation. This medicine may be used for other purposes; ask your health care provider or pharmacist if you have questions. COMMON BRAND NAME(S): Neulasta What should I tell my health care provider before I take this medicine? They need to know if you have any of these conditions: -kidney disease -latex allergy -ongoing radiation therapy -sickle cell disease -skin reactions to acrylic adhesives (On-Body Injector only) -an unusual or allergic reaction to pegfilgrastim, filgrastim, other medicines, foods, dyes, or preservatives -pregnant or trying to get pregnant -breast-feeding How should I use this medicine? This medicine is for injection under the skin. If you get this medicine at home, you will be taught how to prepare and give the pre-filled syringe or how to use the On-body Injector. Refer to the patient Instructions for Use for detailed instructions. Use exactly as directed. Tell your healthcare provider immediately if you suspect that the On-body Injector may not have performed as intended or if you suspect the use of the On-body Injector resulted in a missed or partial dose. It is important that you put your used needles and syringes in a special sharps container. Do not put them in a trash can. If you do not have a sharps container, call your pharmacist or healthcare provider to get one. Talk to your pediatrician regarding the use of this medicine in children. While this drug may be prescribed for selected conditions,  precautions do apply. Overdosage: If you think you have taken too much of this medicine contact a poison control center or emergency room at once. NOTE: This medicine is only for you. Do not share this medicine with others. What if I miss a dose? It is important not to miss your dose. Call your doctor or health care professional if you miss your dose. If you miss a dose due to an On-body Injector failure or leakage, a new dose should be administered as soon as possible using a single prefilled syringe for manual use. What may interact with this medicine? Interactions have not been studied. Give your health care provider a list of all the medicines, herbs, non-prescription drugs, or dietary supplements you use. Also tell them if you smoke, drink alcohol, or use illegal drugs. Some items may interact with your medicine. This list may not describe all possible interactions. Give your health care provider a list of all the medicines, herbs, non-prescription drugs, or dietary supplements you use. Also tell them if you smoke, drink alcohol, or use illegal drugs. Some items may interact with your medicine. What should I watch for while using this medicine? You may need blood work done while you are taking this medicine. If you are going to need a MRI, CT scan, or other procedure, tell your doctor that you are using this medicine (On-Body Injector only). What side effects may I notice from receiving this medicine? Side effects that you should report to your doctor or health care professional as soon as possible: -allergic reactions like skin rash, itching or hives, swelling of the face, lips, or tongue -dizziness -fever -pain, redness, or irritation at site   where injected -pinpoint red spots on the skin -red or dark-brown urine -shortness of breath or breathing problems -stomach or side pain, or pain at the shoulder -swelling -tiredness -trouble passing urine or change in the amount of urine Side  effects that usually do not require medical attention (report to your doctor or health care professional if they continue or are bothersome): -bone pain -muscle pain This list may not describe all possible side effects. Call your doctor for medical advice about side effects. You may report side effects to FDA at 1-800-FDA-1088. Where should I keep my medicine? Keep out of the reach of children. Store pre-filled syringes in a refrigerator between 2 and 8 degrees C (36 and 46 degrees F). Do not freeze. Keep in carton to protect from light. Throw away this medicine if it is left out of the refrigerator for more than 48 hours. Throw away any unused medicine after the expiration date. NOTE: This sheet is a summary. It may not cover all possible information. If you have questions about this medicine, talk to your doctor, pharmacist, or health care provider.  2018 Elsevier/Gold Standard (2016-09-23 12:58:03)  

## 2018-07-21 ENCOUNTER — Other Ambulatory Visit: Payer: Self-pay | Admitting: Internal Medicine

## 2018-07-21 NOTE — Telephone Encounter (Signed)
Copied from [place] [id number]. Topic: Quick Communication - Rx Refill/Question >> Jul 21, 2018  3:18 PM Scherrie Gerlach wrote: Medication: testosterone enanthate (DELATESTRYL) 200 MG/ML injection 5 ml Please send to [place], [place] - [address] [phone] (Phone) [phone] (Fax)  *(not [facility])

## 2018-07-21 NOTE — Telephone Encounter (Signed)
Please advise about refill in Dr. Plotnikovs absence. 

## 2018-07-23 MED ORDER — TESTOSTERONE ENANTHATE 200 MG/ML IM SOLN: 100.0000 mg | INTRAMUSCULAR | 5 refills | Status: DC

## 2018-07-24 ENCOUNTER — Other Ambulatory Visit: Payer: Self-pay | Admitting: Internal Medicine

## 2018-07-24 NOTE — Telephone Encounter (Signed)
Copied from [place] [id number]. Topic: Quick Communication - Rx Refill/Question >> Jul 24, 2018  4:22 PM Cecelia Byars, NT wrote: Medication: HYDROcodone-acetaminophen ([place]) 7.5-325 MG tablet  Has the patient contacted their pharmacy? yes  (Agent: If no, request that the patient contact the pharmacy for the refill. (Agent: If yes, when and what did the pharmacy advise?  Preferred Pharmacy (with phone number or street name [place], [place] [phone] (Phone) [phone] (Fax)    Agent: Please be advised that RX refills may take up to 3 business days. We ask that you follow-up with your pharmacy.

## 2018-07-25 NOTE — Telephone Encounter (Signed)
Check [place] registry last filled 07/03/2018. Not due until 08/02/18 will hold until MD return back in the office on Monday 07/31/18 for approval../lmb

## 2018-07-25 NOTE — Telephone Encounter (Signed)
Requested medication (s) are due for refill today:   Yes  Requested medication (s) are on the active medication list:   Yes  Future visit scheduled:   No   Pt of Plotnikov   Last ordered: 06/03/18  #120  0 refills   Requested Prescriptions  Pending Prescriptions Disp Refills   HYDROcodone-acetaminophen ([place]) 7.5-325 MG tablet 120 tablet 0    Sig: Take 1 tablet by mouth 4 (four) times daily as needed for moderate pain. Please fill on 08/02/18     Not Delegated - Analgesics:  Opioid Agonist Combinations Failed - 07/24/2018  6:49 PM      Failed - This refill cannot be delegated      Failed - Urine Drug Screen completed in last 360 days.      Passed - Valid encounter within last 6 months    Recent Outpatient Visits          5 months ago Hypogonadism in male   [place], Evie Lacks, MD   8 months ago Nephrolithiasis   [place], MD   11 months ago [place], MD      Future Appointments            In 4 weeks Plotnikov, Evie Lacks, MD [place], [place]

## 2018-07-30 MED ORDER — HYDROCODONE-ACETAMINOPHEN 7.5-325 MG PO TABS: 1.0000 | ORAL_TABLET | Freq: Four times a day (QID) | ORAL | 0 refills | Status: DC | PRN

## 2018-07-31 NOTE — Telephone Encounter (Signed)
MD approved and sent electronically to [facility]../lmb  

## 2018-08-01 ENCOUNTER — Other Ambulatory Visit: Payer: Self-pay | Admitting: Hematology

## 2018-08-03 ENCOUNTER — Other Ambulatory Visit: Payer: Self-pay | Admitting: Internal Medicine

## 2018-08-03 NOTE — Telephone Encounter (Signed)
sch OV 

## 2018-08-04 ENCOUNTER — Inpatient Hospital Stay: Payer: Medicare Other

## 2018-08-04 ENCOUNTER — Telehealth: Payer: Self-pay

## 2018-08-04 ENCOUNTER — Encounter: Payer: Self-pay | Admitting: Hematology

## 2018-08-04 ENCOUNTER — Inpatient Hospital Stay (HOSPITAL_BASED_OUTPATIENT_CLINIC_OR_DEPARTMENT_OTHER): Payer: Medicare Other | Admitting: Hematology

## 2018-08-04 VITALS — BP 122/58 | HR 77 | Temp 98.0°F | Resp 18

## 2018-08-04 VITALS — BP 131/58 | HR 83 | Temp 97.8°F | Resp 22 | Ht 75.0 in | Wt 302.6 lb

## 2018-08-04 DIAGNOSIS — C8330 Diffuse large B-cell lymphoma, unspecified site: Secondary | ICD-10-CM

## 2018-08-04 DIAGNOSIS — Z79899 Other long term (current) drug therapy: Secondary | ICD-10-CM

## 2018-08-04 DIAGNOSIS — C8339 Diffuse large B-cell lymphoma, extranodal and solid organ sites: Secondary | ICD-10-CM

## 2018-08-04 DIAGNOSIS — Z5112 Encounter for antineoplastic immunotherapy: Secondary | ICD-10-CM | POA: Diagnosis not present

## 2018-08-04 DIAGNOSIS — Z7189 Other specified counseling: Secondary | ICD-10-CM

## 2018-08-04 LAB — CMP (CANCER CENTER ONLY)
ALBUMIN: 3.1 g/dL — AB (ref 3.5–5.0)
ALT: 41 U/L (ref 0–44)
ANION GAP: 9 (ref 5–15)
AST: 26 U/L (ref 15–41)
Alkaline Phosphatase: 67 U/L (ref 38–126)
BUN: 31 mg/dL — ABNORMAL HIGH (ref 8–23)
CALCIUM: 9.4 mg/dL (ref 8.9–10.3)
CHLORIDE: 106 mmol/L (ref 98–111)
CO2: 26 mmol/L (ref 22–32)
Creatinine: 1.18 mg/dL (ref 0.61–1.24)
GFR, Estimated: 60 mL/min — ABNORMAL LOW (ref >60)
Glucose, Bld: 123 mg/dL — ABNORMAL HIGH (ref 70–99)
Potassium: 4.6 mmol/L (ref 3.5–5.1)
SODIUM: 141 mmol/L (ref 135–145)
Total Bilirubin: 0.5 mg/dL (ref 0.3–1.2)
Total Protein: 6.6 g/dL (ref 6.5–8.1)

## 2018-08-04 LAB — CBC WITH DIFFERENTIAL/PLATELET
ABS IMMATURE GRANULOCYTES: 0.09 10*3/uL — AB (ref 0.00–0.07)
Basophils Absolute: 0.1 10*3/uL (ref 0.0–0.1)
Basophils Relative: 2 %
Eosinophils Absolute: 0.1 10*3/uL (ref 0.0–0.5)
Eosinophils Relative: 1 %
HEMATOCRIT: 43.6 % (ref 39.0–52.0)
HEMOGLOBIN: 14.5 g/dL (ref 13.0–17.0)
Immature Granulocytes: 1 %
LYMPHS ABS: 1.3 10*3/uL (ref 0.7–4.0)
LYMPHS PCT: 19 %
MCH: 30 pg (ref 26.0–34.0)
MCHC: 33.3 g/dL (ref 30.0–36.0)
MCV: 90.1 fL (ref 80.0–100.0)
MONO ABS: 0.8 10*3/uL (ref 0.1–1.0)
MONOS PCT: 11 %
Neutro Abs: 4.5 10*3/uL (ref 1.7–7.7)
Neutrophils Relative %: 66 %
Platelets: 160 10*3/uL (ref 150–400)
RBC: 4.84 MIL/uL (ref 4.22–5.81)
RDW: 15.9 % — ABNORMAL HIGH (ref 11.5–15.5)
WBC: 6.9 10*3/uL (ref 4.0–10.5)
nRBC: 0 % (ref 0.0–0.2)

## 2018-08-04 LAB — MAGNESIUM: MAGNESIUM: 1.6 mg/dL — AB (ref 1.7–2.4)

## 2018-08-04 MED ORDER — SODIUM CHLORIDE 0.9 % IV SOLN
375.0000 mg/m2 | Freq: Once | INTRAVENOUS | Status: AC
  Administered 2018-08-04: 1000 mg via INTRAVENOUS
  Filled 2018-08-04: qty 100

## 2018-08-04 MED ORDER — MAGNESIUM SULFATE 50 % IJ SOLN: 2000.0000 mg | Freq: Once | INTRAVENOUS | Status: DC

## 2018-08-04 MED ORDER — SODIUM CHLORIDE 0.9 % IV SOLN
Freq: Once | INTRAVENOUS | Status: AC
  Administered 2018-08-04: 10:00:00 via INTRAVENOUS
  Filled 2018-08-04: qty 250

## 2018-08-04 MED ORDER — PALONOSETRON HCL INJECTION 0.25 MG/5ML
0.2500 mg | Freq: Once | INTRAVENOUS | Status: AC
  Administered 2018-08-04: 0.25 mg via INTRAVENOUS

## 2018-08-04 MED ORDER — DOXORUBICIN HCL CHEMO IV INJECTION 2 MG/ML
50.0000 mg/m2 | Freq: Once | INTRAVENOUS | Status: AC
  Administered 2018-08-04: 134 mg via INTRAVENOUS
  Filled 2018-08-04: qty 67

## 2018-08-04 MED ORDER — DIPHENHYDRAMINE HCL 25 MG PO CAPS
ORAL_CAPSULE | ORAL | Status: AC
  Filled 2018-08-04: qty 2

## 2018-08-04 MED ORDER — SODIUM CHLORIDE 0.9% FLUSH
10.0000 mL | INTRAVENOUS | Status: DC | PRN
  Administered 2018-08-04: 10 mL
  Filled 2018-08-04: qty 10

## 2018-08-04 MED ORDER — VINCRISTINE SULFATE CHEMO INJECTION 1 MG/ML
2.0000 mg | Freq: Once | INTRAVENOUS | Status: AC
  Administered 2018-08-04: 2 mg via INTRAVENOUS
  Filled 2018-08-04: qty 2

## 2018-08-04 MED ORDER — PREDNISONE 20 MG PO TABS: 60.0000 mg | ORAL_TABLET | Freq: Every day | ORAL | 0 refills | Status: DC

## 2018-08-04 MED ORDER — DEXAMETHASONE SODIUM PHOSPHATE 10 MG/ML IJ SOLN
INTRAMUSCULAR | Status: AC
  Filled 2018-08-04: qty 1

## 2018-08-04 MED ORDER — EZETIMIBE 10 MG PO TABS: 10.0000 mg | ORAL_TABLET | Freq: Every day | ORAL | 3 refills | Status: DC

## 2018-08-04 MED ORDER — ACETAMINOPHEN 325 MG PO TABS
ORAL_TABLET | ORAL | Status: AC
  Filled 2018-08-04: qty 2

## 2018-08-04 MED ORDER — HEPARIN SOD (PORK) LOCK FLUSH 100 UNIT/ML IV SOLN
500.0000 [IU] | Freq: Once | INTRAVENOUS | Status: AC | PRN
  Administered 2018-08-04: 500 [IU]
  Filled 2018-08-04: qty 5

## 2018-08-04 MED ORDER — PALONOSETRON HCL INJECTION 0.25 MG/5ML
INTRAVENOUS | Status: AC
  Filled 2018-08-04: qty 5

## 2018-08-04 MED ORDER — ACETAMINOPHEN 325 MG PO TABS
650.0000 mg | ORAL_TABLET | Freq: Once | ORAL | Status: AC
  Administered 2018-08-04: 650 mg via ORAL

## 2018-08-04 MED ORDER — MAGNESIUM SULFATE 2 GM/50ML IV SOLN
2.0000 g | Freq: Once | INTRAVENOUS | Status: AC
  Administered 2018-08-04: 2 g via INTRAVENOUS
  Filled 2018-08-04: qty 50

## 2018-08-04 MED ORDER — SODIUM CHLORIDE 0.9 % IV SOLN
740.0000 mg/m2 | Freq: Once | INTRAVENOUS | Status: AC
  Administered 2018-08-04: 2000 mg via INTRAVENOUS
  Filled 2018-08-04: qty 100

## 2018-08-04 MED ORDER — DIPHENHYDRAMINE HCL 25 MG PO CAPS
50.0000 mg | ORAL_CAPSULE | Freq: Once | ORAL | Status: AC
  Administered 2018-08-04: 50 mg via ORAL

## 2018-08-04 MED ORDER — DEXAMETHASONE SODIUM PHOSPHATE 10 MG/ML IJ SOLN
10.0000 mg | Freq: Once | INTRAMUSCULAR | Status: AC
  Administered 2018-08-04: 10 mg via INTRAVENOUS

## 2018-08-04 NOTE — Progress Notes (Signed)
HEMATOLOGY/ONCOLOGY CLINIC NOTE  Date of Service: 08/04/2018  Patient Care Team: Plotnikov, Evie Lacks, MD as PCP - General (Internal Medicine)  CHIEF COMPLAINTS/PURPOSE OF CONSULTATION:  Follow Up for Diffuse Large B-Cell Lymphoma   HISTORY OF PRESENTING ILLNESS:   [name] is a wonderful 72 y.o. male who has been referred to [place] by Dr. Lew Dawes for evaluation and management of Diffuse Large B-Cell Lymphoma. He is accompanied today by his partner. The pt reports that he is doing well overall.   The pt reports that he presented to ENT Dr. Pollie Friar care after developing a cough and a new nodule in his mouth. He notes that the symptoms presented about a month ago and have not progressed or significantly worsened. He notes that he has frequent sinus drainage, and felt that this enlargement could initially be related to this. He denies any changes in his voice and difficulty breathing.   The pt notes that he is finishing three months of PT tomorrow after surgeries on his right leg and left knee. He notes that he takes blood thinners for Afib and uses his CPAP regularly now. The pt also receives testosterone injections, 200mg  every other week. The pt notes that he had several surgeries bilaterally to remove large kidney stones in the past. He notes that his thyroid replacement has been stable. His vertebral compression fracture was in 2013, after a fall in which he also broke his right femur and wrist. The pt also takes Prolia every 6 months for his osteoporosis. He has had 18 surgeries on his right leg.   He notes that his psoriasis has been stable and takes Enbrel twice a week, and has taken this for 5 years. He was on 10 years of Humara before this. He was on Methotrexate prior to [facility].   The pt notes that he functions okay at home and is able to drive himself and cook for himself and feels that he can function independently with limitations on lifting heavier objects.   On  review of systems, pt reports recent cough, left tonsil mass, stable energy levels, and denies fevers, chills, night sweats, unexpected weight loss, difficulty breathing, difficulty swallowing, noticing any other lumps or bumps, pain along the spine, recent infections, abdominal pains, difficulty urinating, leg swelling, and any other symptoms.  . On Social Hx the pt reports that he smoked cigarettes for many years until he quit 6 years ago, and drinks ETOH socially.    INTERVAL HISTORY   [name] is here for management, evaluation, and prior to C3D1 R-CHOP treatment of his Diffuse Large B-Cell Lymphoma. The patient's last visit with [place] was on 07/13/18. He is accompanied today by his partner. The pt reports that he is doing well overall.   The pt reports that he has begun using a walker to ambulate after feeling that his legs have become weaker. The pt notes that he has continued to eat well and has been moving his bowels well, denying any nausea or constipation. The pt adds that he hasn't had any abdominal pains or leg swelling. He is interested in beginning  outpatient PT.   Lab results today (08/04/18) of CBC w/diff, CMP is as follows: all values are WNL except for RDW at 15.9, Abs immature granulocytes at 0.09k, Glucose at 123, BUN at 31, Albumin at 3.1, GFR at 60. 08/04/18 Magnesium at 1.6  On review of systems, pt reports eating well, moving his bowels well, stable energy levels, and  denies fevers, chills, nausea, constipation, abdominal pains, leg swelling, and any other symptoms.    MEDICAL HISTORY:  Past Medical History:  Diagnosis Date  . A-fib (HCC)    hx.- sinus rhythm now  . Acid reflux   . AKI (acute kidney injury) (HCC)    mild  . Anxiety   . Arthritis   . Atrial dilatation, bilateral    Severly, enlargement  per ECHO 11/03/16  . BPH (benign prostatic hyperplasia)   . CAD (coronary artery disease)   . Depression   . Dislocation of internal right hip prosthesis,  sequela   . Dysrhythmia    hx a fib  . Elevated brain natriuretic peptide (BNP) level   . Environmental allergies   . Fracture of wrist 10/23/13   LEFT  . High cholesterol   . History of Graves' disease   . History of kidney stones    surg. removal 02/2017  . History of syncope   . HLD (hyperlipidemia)   . Hypertension   . Hypothyroidism    thyroid removed  . Left knee injury    cap dislocated  . Low testosterone, possible hypogonadism 10/01/2012  . Lumbago   . Metabolic bone disease   . Multiple falls   . Osteoporosis    Severe  . Plaque psoriasis   . Sleep apnea    cpap machine-settings 17  . Thrombocytopenia ([place])   . Thyroid disease    HX GRAVES DISEASE  . Transfusion history    s/p 12'13 hip surgery  . Unsteady gait   . UTI (urinary tract infection)   . Vertebral compression fracture (HCC)    L1-wears brace    SURGICAL HISTORY: Past Surgical History:  Procedure Laterality Date  . APPENDECTOMY    . CARDIAC CATHETERIZATION  2013  . CATARACT EXTRACTION, BILATERAL    . COLONOSCOPY    . CYSTOSCOPY WITH RETROGRADE PYELOGRAM, URETEROSCOPY AND STENT PLACEMENT Left 11/07/2017   Procedure: CYSTOSCOPY WITH RETROGRADE PYELOGRAM, URETEROSCOPY AND STENT PLACEMENT;  Surgeon: Cleon Gustin, MD;  Location: [facility];  Service: Urology;  Laterality: Left;  . CYSTOSCOPY/URETEROSCOPY/HOLMIUM LASER/STENT PLACEMENT Right 03/11/2017   Procedure: CYSTOSCOPY/URETEROSCOPYSTENT PLACEMENT right ureter retrograde pylegram;  Surgeon: Cleon Gustin, MD;  [facility]: [facility];  Service: Urology;  Laterality: Right;  . HARDWARE REMOVAL  10/05/2012   Procedure: HARDWARE REMOVAL;  Surgeon: [name], MD;  [facility];  Service: Orthopedics;  Laterality: Right;  REMOVING  STRYKER  GAMMA NAIL  . HARDWARE REMOVAL Right 07/03/2013   Procedure: HARDWARE REMOVAL RIGHT TIBIA ;  Surgeon: Rozanna Box, MD;  Location: [place];  Service: Orthopedics;  Laterality: Right;  . HIP CLOSED REDUCTION  Right 10/15/2013   Procedure: CLOSED MANIPULATION HIP;  Surgeon: [name], MD;  [facility];  Service: Orthopedics;  Laterality: Right;  . hip revision     June 2017  . HOLMIUM LASER APPLICATION Right 06/18/9891   Procedure: HOLMIUM LASER APPLICATION;  Surgeon: Cleon Gustin, MD;  [facility];  Service: Urology;  Laterality: Right;  . INCISION AND DRAINAGE HIP Right 11/16/2013   Procedure: IRRIGATION AND DEBRIDEMENT RIGHT HIP;  Surgeon: [name], MD;  [facility];  Service: Orthopedics;  Laterality: Right;  . IR IMAGING GUIDED PORT INSERTION  06/22/2018  . IR URETERAL STENT LEFT NEW ACCESS W/O SEP NEPHROSTOMY CATH  10/24/2017  . JOINT REPLACEMENT     hip-right x2  . KNEE SURGERY Bilateral 2012   Total knee replacements  .  NEPHROLITHOTOMY Right 02/08/2017   Procedure: NEPHROLITHOTOMY PERCUTANEOUS WITH SURGEON ACCESS;  Surgeon: Cleon Gustin, MD;  [facility];  Service: Urology;  Laterality: Right;  . NEPHROLITHOTOMY Left 10/24/2017   Procedure: NEPHROLITHOTOMY PERCUTANEOUS;  Surgeon: Cleon Gustin, MD;  [facility];  Service: Urology;  Laterality: Left;  . ORIF TIBIA FRACTURE Right 02/06/2013   Procedure: OPEN REDUCTION INTERNAL FIXATION (ORIF) TIBIA FRACTURE WITH IM ROD FIBULA;  Surgeon: Rozanna Box, MD;  Location: [place];  Service: Orthopedics;  Laterality: Right;  . ORIF TIBIA FRACTURE Right 07/03/2013   Procedure: RIGHT TIBIA NON UNION REPAIR ;  Surgeon: Rozanna Box, MD;  Location: [place];  Service: Orthopedics;  Laterality: Right;  . ORIF WRIST FRACTURE  10/02/2012   Procedure: OPEN REDUCTION INTERNAL FIXATION (ORIF) WRIST FRACTURE;  Surgeon: Roseanne Kaufman, MD;  [facility];  Service: Orthopedics;  Laterality: Right;  WITH   ANTIBIOTIC  CEMENT  . ORIF WRIST FRACTURE Left 10/28/2013   Procedure: OPEN REDUCTION INTERNAL FIXATION (ORIF) WRIST FRACTURE with allograft;  Surgeon: Roseanne Kaufman, MD;  [facility];  Service: Orthopedics;   Laterality: Left;  DVR Plate  . QUADRICEPS TENDON REPAIR Left 07/15/2017   Procedure: REPAIR QUADRICEP TENDON;  Surgeon: Frederik Pear, MD;  Location: [place];  Service: Orthopedics;  Laterality: Left;  . right femur surgery  05/2012  . THYROIDECTOMY  02/1986  . TOTAL HIP REVISION  10/05/2012   Procedure: TOTAL HIP REVISION;  Surgeon: [name], MD;  [facility];  Service: Orthopedics;  Laterality: Right;  RIGHT TOTAL HIP REVISION  . TOTAL HIP REVISION Right 09/17/2013   Procedure: REVISION RIGHT TOTAL HIP ARTHROPLASTY ;  Surgeon: [name], MD;  [facility];  Service: Orthopedics;  Laterality: Right;  . TOTAL HIP REVISION Right 10/26/2013   Procedure: REVISION RIGHT TOTAL HIP ARTHROPLASTY;  Surgeon: [name], MD;  [facility];  Service: Orthopedics;  Laterality: Right;  . TOTAL HIP REVISION  03/2016  . TOTAL KNEE REVISION Left 04/11/2017   Procedure: TOTAL KNEE REVISION PATELLA and TIBIA;  Surgeon: Frederik Pear, MD;  Location: [place];  Service: Orthopedics;  Laterality: Left;  . WRIST FRACTURE SURGERY  05/2012    SOCIAL HISTORY: Social History   Socioeconomic History  . Marital status: Married    Spouse name: Not on file  . Number of children: Not on file  . Years of education: Not on file  . Highest education level: Not on file  Occupational History  . Not on file  Social Needs  . Financial resource strain: Not on file  . Food insecurity:    Worry: Not on file    Inability: Not on file  . Transportation needs:    Medical: Not on file    Non-medical: Not on file  Tobacco Use  . Smoking status: Former Smoker    Last attempt to quit: 06/05/2012    Years since quitting: 6.1  . Smokeless tobacco: Never Used  Substance and Sexual Activity  . Alcohol use: Yes    Comment: occasional-social  . Drug use: No  . Sexual activity: Yes  Lifestyle  . Physical activity:    Days per week: Not on file    Minutes per session: Not on file  . Stress: Not on file    Relationships  . Social connections:    Talks on phone: Not on file    Gets together: Not on file    Attends religious service:  Not on file    Active member of club or organization: Not on file    Attends meetings of clubs or organizations: Not on file    Relationship status: Not on file  . Intimate partner violence:    Fear of current or ex partner: Not on file    Emotionally abused: Not on file    Physically abused: Not on file    Forced sexual activity: Not on file  Other Topics Concern  . Not on file  Social History Narrative   [place] 2.5 months, went home Feb 21st slipped and fell on back and developed.  Home PT/OT.  Just started outpatient physical therapy.  Friday night, misstepped.      FAMILY HISTORY: Family History  Problem Relation Age of Onset  . CAD Father 39  . Asthma Father   . Alcohol abuse Father   . Arthritis Mother   . Alcohol abuse Sister     ALLERGIES:  is allergic to short ragweed pollen ext.  MEDICATIONS:  Current Outpatient Medications  Medication Sig Dispense Refill  . alfuzosin (UROXATRAL) 10 MG 24 hr tablet Take 10 mg by mouth at bedtime.    [name] amoxicillin (AMOXIL) 500 MG capsule Take 2,000 mg by mouth See admin instructions. Takes 4 capsules 1 hour prior to dental procedures    . atorvastatin (LIPITOR) 80 MG tablet Take 80 mg by mouth daily.    [name] buPROPion (WELLBUTRIN XL) 300 MG 24 hr tablet TAKE 1 TABLET DAILY WITH  BREAKFAST BY MOUTH. (Patient taking differently: Take 300 mg by mouth daily. ) 90 tablet 3  . Calcium Citrate-Vitamin D (CALCIUM CITRATE + D PO) Take 1 tablet by mouth 2 (two) times daily.     . Cholecalciferol (VITAMIN D-3 PO) Take 2,000 Units by mouth 2 (two) times daily.     . clonazePAM (KLONOPIN) 1 MG tablet Take 1 tablet (1 mg total) by mouth every 6 (six) hours as needed for anxiety. TAKE 1 TABLET BY MOUTH  EVERY 6 HOURS prn 360 tablet 1  . denosumab (PROLIA) 60 MG/ML SOLN injection Inject 60 mg into the skin every 6 (six)  months. Administer in upper arm, thigh, or abdomen    . diltiazem (CARDIZEM CD) 240 MG 24 hr capsule TAKE 1 CAPSULE BY MOUTH  DAILY 90 capsule 3  . doxycycline (VIBRA-TABS) 100 MG tablet TAKE 1 TABLET BY MOUTH  DAILY 90 tablet 1  . DULoxetine (CYMBALTA) 60 MG capsule TAKE 1 CAPSULE BY MOUTH  TWICE DAILY 180 capsule 1  . EPINEPHrine (EPIPEN 2-PAK) 0.3 mg/0.3 mL IJ SOAJ injection Inject 0.3 mg into the muscle daily as needed (for anaphylactic reaction).    [name] etanercept (ENBREL) 50 MG/ML injection Inject 50 mg into the skin 2 (two) times a week. Tuesday & Friday    . ferrous sulfate 325 (65 FE) MG tablet Take 325 mg by mouth 2 (two) times daily with a meal.    . HYDROcodone-acetaminophen (NORCO) 7.5-325 MG tablet Take 1 tablet by mouth 4 (four) times daily. Please fill on 06/03/18 120 tablet 0  . HYDROcodone-acetaminophen (NORCO) 7.5-325 MG tablet Take 1 tablet by mouth 4 (four) times daily as needed for moderate pain. Please fill on 07/03/18 120 tablet 0  . HYDROcodone-acetaminophen (NORCO) 7.5-325 MG tablet Take 1 tablet by mouth 4 (four) times daily as needed for moderate pain. Please fill on 08/02/18 120 tablet 0  . ipratropium (ATROVENT) 0.03 % nasal spray Place 1 spray into the nose daily as  needed.    [name] levothyroxine (SYNTHROID, LEVOTHROID) 200 MCG tablet TAKE 1 TABLET DAILY AT 6 AM BY MOUTH. (Patient taking differently: Take 225 mcg by mouth daily before breakfast. ) 90 tablet 3  . levothyroxine (SYNTHROID, LEVOTHROID) 25 MCG tablet TAKE 1 TABLET BY MOUTH  DAILY BEFORE BREAKFAST. (Patient taking differently: Take 225 mcg by mouth daily before breakfast. ) 90 tablet 3  . lidocaine-prilocaine (EMLA) cream Apply to affected area once 30 g 3  . loratadine (CLARITIN) 10 MG tablet Take 10 mg by mouth daily.     [name] LORazepam (ATIVAN) 0.5 MG tablet Take 1 tablet (0.5 mg total) by mouth every 6 (six) hours as needed (Nausea or vomiting). 30 tablet 0  . metoprolol tartrate (LOPRESSOR) 25 MG tablet Take 1  tablet (25 mg total) by mouth 2 (two) times daily. 60 tablet 3  . mirabegron ER (MYRBETRIQ) 25 MG TB24 tablet Take 25 mg by mouth at bedtime.    . Multiple Vitamin (MULTIVITAMIN WITH MINERALS) TABS tablet Take 1 tablet by mouth daily. Men's One-A-Day 8+    . nitroGLYCERIN (NITROSTAT) 0.4 MG SL tablet Place 0.4 mg under the tongue every 5 (five) minutes as needed for chest pain. x3 doses as needed for chest pain    . omeprazole (PRILOSEC) 40 MG capsule TAKE 1 CAPSULE DAILY BEFORE BREAKFAST BY MOUTH. (Patient taking differently: Take 40 mg by mouth daily. ) 90 capsule 3  . ondansetron (ZOFRAN) 8 MG tablet Take 1 tablet (8 mg total) by mouth 2 (two) times daily as needed for refractory nausea / vomiting. chemotherapy. 30 tablet 1  . predniSONE (DELTASONE) 20 MG tablet Take 3 tablets (60 mg total) by mouth daily. Take on days 1-5 of chemotherapy. 15 tablet 6  . PROAIR HFA 108 (90 Base) MCG/ACT inhaler Inhale 1 puff into the lungs every 4 (four) hours as needed.    . Probiotic Product (PROBIOTIC PO) Take 1 capsule by mouth daily with breakfast.    . prochlorperazine (COMPAZINE) 10 MG tablet Take 1 tablet (10 mg total) by mouth every 6 (six) hours as needed (Nausea or vomiting). 30 tablet 6  . testosterone enanthate (DELATESTRYL) 200 MG/ML injection INJECT 1ML (200MG  TOTAL) INTO THE MUSCLE EVERY 14 DAYS. FOR INTRAMUSCULAR USE ONLY. DISCARD VIAL AFTER 28 DAYS. 5 mL 4  . tiZANidine (ZANAFLEX) 4 MG tablet TAKE 1 TABLET BY MOUTH  EVERY 6 HOURS AS NEEDED FOR MUSCLE SPASM(S) (Patient taking differently: Take 4 mg by mouth every 6 (six) hours as needed for muscle spasms. ) 360 tablet 1  . triamcinolone ointment (KENALOG) 0.5 % APPLY TO AFFECTED AREA TWICE A DAY. 30 g 0  . vitamin C (ASCORBIC ACID) 500 MG tablet Take 500 mg by mouth daily.    Alveda Reasons 10 MG TABS tablet TAKE 1 TABLET DAILY BY  MOUTH. 90 tablet 3   Current Facility-Administered Medications  Medication Dose Route Frequency Provider Last Rate Last  Dose  . magnesium sulfate 2,000 mg in dextrose 5 % 50 mL IVPB  2,000 mg Intravenous Once Brunetta Genera, MD        REVIEW OF SYSTEMS:    A 10+ POINT REVIEW OF SYSTEMS WAS OBTAINED including neurology, dermatology, psychiatry, cardiac, respiratory, lymph, extremities, GI, GU, Musculoskeletal, constitutional, breasts, reproductive, HEENT.  All pertinent positives are noted in the HPI.  All others are negative.   PHYSICAL EXAMINATION: ECOG PERFORMANCE STATUS: 2 - Symptomatic, <50% confined to bed  . Vitals:   08/04/18 0844  BP: [name])  131/58  Pulse: 83  Resp: (!) 22  Temp: 97.8 F (36.6 C)  SpO2: 98%   Filed Weights   08/04/18 0844  Weight: (!) 302 lb 9.6 oz (137.3 kg)   .Body mass index is 37.82 kg/m.  GENERAL:alert, in no acute distress and comfortable SKIN: chronic venous stasis changes in BLE, psoriasis rashes on elbows EYES: conjunctiva are pink and non-injected, sclera anicteric OROPHARYNX: enlarged left tonsil with erythema extending up to midline in the posterior cavity  NECK: supple, no JVD LYMPH:  Couple palpable enlarged cervical lymph nodes b/l, no palpable lymphadenopathy in the axillary or inguinal regions LUNGS: clear to auscultation b/l with normal respiratory effort HEART: regular rate & rhythm ABDOMEN:  normoactive bowel sounds , non tender, not distended. No palpable hepatosplenomegaly.  Extremity: no pedal edema PSYCH: alert & oriented x 3 with fluent speech NEURO: no focal motor/sensory deficits   LABORATORY DATA:  I have reviewed the data as listed  . CBC Latest Ref Rng & Units 08/04/2018 07/13/2018 07/03/2018  WBC 4.0 - 10.5 K/uL 6.9 7.0 4.9  Hemoglobin 13.0 - 17.0 g/dL 14.5 15.9 15.5  Hematocrit 39.0 - 52.0 % 43.6 47.6 47.3  Platelets 150 - 400 K/uL 160 190 112(L)    . CMP Latest Ref Rng & Units 08/04/2018 07/13/2018 07/03/2018  Glucose 70 - 99 mg/dL 123(H) 119(H) 134(H)  BUN 8 - 23 mg/dL 31(H) 28(H) 26(H)  Creatinine 0.61 - 1.24 mg/dL 1.18  1.21 1.26(H)  Sodium 135 - 145 mmol/L 141 138 139  Potassium 3.5 - 5.1 mmol/L 4.6 5.4(H) 4.6  Chloride 98 - 111 mmol/L 106 103 102  CO2 22 - 32 mmol/L 26 30 26   Calcium 8.9 - 10.3 mg/dL 9.4 9.0 9.0  Total Protein 6.5 - 8.1 g/dL 6.6 7.3 6.4(L)  Total Bilirubin 0.3 - 1.2 mg/dL 0.5 0.5 0.5  Alkaline Phos 38 - 126 U/L 67 72 69  AST 15 - 41 U/L 26 21 20   ALT 0 - 44 U/L 41 32 41   05/29/18 Left tonsil biopsy:   RADIOGRAPHIC STUDIES: I have personally reviewed the radiological images as listed and agreed with the findings in the report. No results found.  ASSESSMENT & PLAN:  72 y.o. male with  1. Diffuse Large B-Cell Lymphoma,- Stage III Left tonsil -05/29/18 Left tonsil biopsy which revealed Diffuse Large B-Cell Lymphoma, which requires treatment  -06/07/18 LDH at 214, will monitor change through treatment.  -Baseline ECHO 06/14/18: EF 50-55% -Standard regimen of R-CHOP with G-CSF support for 6 cycles starting 06/23/18  06/16/18 PET/CT revealed 1. Deauville 5 hypermetabolic lesions in the palatine tonsils, left internal jugular chain, and involving a right inguinal lymph node. Deauville 4 involvement of a left inguinal lymph node and an enlarged subcutaneous lesion favoring lymph node superficial to the lower right sternocleidomastoid muscle in the neck. 2. Other imaging findings of potential clinical significance: Aortic Atherosclerosis. Coronary atherosclerosis. Emphysema. Bilateral nonobstructive nephrolithiasis. Umbilical hernia contains adipose tissues. Old right rib fractures some of which are nonunited. 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   PLAN:  -Recommended salt and baking soda mouthwashes 3-4 times each day -Will continue to monitor mild neuropathy in fingertips -Recommend Claritin and Tylenol for mild bone pains related to neulasta, pt will let me know if he develops worsening bone pains  -Pt holding Enbrel and testosterone since last visit. I previously prescribed Kenalog for topical use  for now. If needed he may restart Testosterone at low dose. Will monitor.  -Discussed pt labwork today, 08/04/18;  blood counts and chemistries are stable -Will set the pt up for outpatient PT  -The pt has no prohibitive toxicities from continuing C3 of R-CHOP at this time.     2.  Patient Active Problem List   Diagnosis Date Noted  . Counseling regarding advance care planning and goals of care 07/09/2018  . Diffuse large B-cell lymphoma ([place]) 06/23/2018  . Port-A-Cath in place 06/23/2018  . Hypogonadism in male 11/22/2017  . Nephrolithiasis 10/24/2017  . Recurrent dislocation of left patella 07/15/2017  . Recurrent subluxation of patella, left 07/08/2017  . S/P revision of total knee 04/11/2017  . Failed total knee, left, initial encounter ([place]) 04/09/2017  . Renal calculus, right 02/08/2017  . Protein-calorie malnutrition, severe ([place]) 12/08/2016  . Chronic obstructive pulmonary disease ([place]) 12/08/2016  . Staghorn renal calculus 11/05/2016  . Wound drainage right hip 11/15/2013  . Expected blood loss anemia 09/18/2013  . Hyponatremia 09/18/2013  . S/P right TH revision 09/17/2013  . Nonunion, fracture, Right tibia  07/03/2013  . UTI (urinary tract infection) 07/03/2013  . Pathological fracture due to osteoporosis with delayed healing 04/24/2013  . Osteoporosis with fracture 04/24/2013  . Rash and nonspecific skin eruption 03/08/2013  . Syncope 02/21/2013  . PAF (paroxysmal atrial fibrillation) ([place]) 02/21/2013  . Chronic anticoagulation 02/21/2013  . Fall 02/08/2013  . Physical deconditioning 02/08/2013  . Low back pain radiating to both legs 02/08/2013  . Compression fracture of L1 lumbar vertebra (HCC) 02/08/2013  . Right tibial fracture 02/05/2013  . Obesity, unspecified 02/05/2013  . Osteomyelitis of right wrist ([place]) 11/02/2012  . Anemia 10/06/2012  . HTN (hypertension) 10/03/2012  . Psoriasis 10/03/2012  . Dyslipidemia 10/03/2012  . Vitamin D deficiency 10/03/2012    . GERD (gastroesophageal reflux disease) 10/03/2012  . Hyperglycemia 10/03/2012  . Anxiety 10/03/2012  . Depression 10/03/2012  . CAD (coronary artery disease) 10/03/2012  . DJD (degenerative joint disease) 10/03/2012  . Chronic gingivitis 10/03/2012  . Low testosterone, possible hypogonadism 10/01/2012  . Normocytic anemia 09/30/2012  . Right femoral fracture ([place]) 09/29/2012  . Right forearm fracture 09/29/2012  . Atrial fibrillation ([place]) 09/29/2012  . Hypothyroidism 09/29/2012  . History of recurrent UTIs 09/29/2012   -continue mx of medical co-morbidities with PCP   Refer to cancer rehab for balance and muscle strengthening PET/CT in 18 days Please move C4 R-CHOP with lab and MD appointments from 11/15 to 08/28/2018    All of the patients questions were answered with apparent satisfaction. The patient knows to call the clinic with any problems, questions or concerns.  The total time spent in the appt was 25 minutes and more than 50% was on counseling and direct patient cares.     Sullivan Lone MD MS AAHIVMS [facility] Hematology/Oncology Physician [facility]  (Office):       [phone] (Work cell):  [phone] (Fax):           [phone]  08/04/2018 9:23 AM  I, Baldwin [place], am acting as a scribe for Dr. Irene Limbo  .I have reviewed the above documentation for accuracy and completeness, and I agree with the above. Brunetta Genera MD

## 2018-08-04 NOTE — Patient Instructions (Signed)
[facility] Discharge Instructions for Patients Receiving Chemotherapy  Today you received the following chemotherapy agents doxorubicin (Adriamycin), vincristine (Oncovin), cyclophosphamide (Cytoxan) and rituximab (Rituxan)   To help prevent nausea and vomiting after your treatment, we encourage you to take your nausea medication as directed. No Zofran for three days, take Compazine instead.    If you develop nausea and vomiting that is not controlled by your nausea medication, call the clinic.   BELOW ARE SYMPTOMS THAT SHOULD BE REPORTED IMMEDIATELY:  *FEVER GREATER THAN 100.5 F  *CHILLS WITH OR WITHOUT FEVER  NAUSEA AND VOMITING THAT IS NOT CONTROLLED WITH YOUR NAUSEA MEDICATION  *UNUSUAL SHORTNESS OF BREATH  *UNUSUAL BRUISING OR BLEEDING  TENDERNESS IN MOUTH AND THROAT WITH OR WITHOUT PRESENCE OF ULCERS  *URINARY PROBLEMS  *BOWEL PROBLEMS  UNUSUAL RASH Items with * indicate a potential emergency and should be followed up as soon as possible.  Feel free to call the clinic should you have any questions or concerns. The clinic phone number is [phone].  Please show the Painter at check-in to the Emergency Department and triage nurse.

## 2018-08-04 NOTE — Telephone Encounter (Signed)
Printed avs and calender of upcoming appointments. Per 10/25 los was unable to scheduled appointment on 11/18 due to no infusion ava.

## 2018-08-04 NOTE — Telephone Encounter (Signed)
Pt update with lab results along with Dr. Blenda Mounts recommendation. Zetia 10 mg sent to pharmacy and lab orders placed.

## 2018-08-07 ENCOUNTER — Inpatient Hospital Stay: Payer: Medicare Other

## 2018-08-07 VITALS — BP 161/77 | HR 81 | Temp 97.6°F | Resp 18

## 2018-08-07 DIAGNOSIS — C8330 Diffuse large B-cell lymphoma, unspecified site: Secondary | ICD-10-CM

## 2018-08-07 DIAGNOSIS — Z7189 Other specified counseling: Secondary | ICD-10-CM

## 2018-08-07 DIAGNOSIS — Z5112 Encounter for antineoplastic immunotherapy: Secondary | ICD-10-CM | POA: Diagnosis not present

## 2018-08-07 MED ORDER — PEGFILGRASTIM-CBQV 6 MG/0.6ML ~~LOC~~ SOSY
6.0000 mg | PREFILLED_SYRINGE | Freq: Once | SUBCUTANEOUS | Status: AC
  Administered 2018-08-07: 6 mg via SUBCUTANEOUS

## 2018-08-07 MED ORDER — PEGFILGRASTIM-CBQV 6 MG/0.6ML ~~LOC~~ SOSY
PREFILLED_SYRINGE | SUBCUTANEOUS | Status: AC
  Filled 2018-08-07: qty 0.6

## 2018-08-10 ENCOUNTER — Ambulatory Visit: Payer: Medicare Other | Attending: Hematology | Admitting: Rehabilitation

## 2018-08-10 ENCOUNTER — Encounter: Payer: Self-pay | Admitting: Rehabilitation

## 2018-08-10 ENCOUNTER — Telehealth: Payer: Self-pay | Admitting: Cardiovascular Disease

## 2018-08-10 ENCOUNTER — Other Ambulatory Visit: Payer: Self-pay

## 2018-08-10 DIAGNOSIS — Z9181 History of falling: Secondary | ICD-10-CM | POA: Diagnosis present

## 2018-08-10 DIAGNOSIS — R2689 Other abnormalities of gait and mobility: Secondary | ICD-10-CM | POA: Diagnosis present

## 2018-08-10 DIAGNOSIS — C8330 Diffuse large B-cell lymphoma, unspecified site: Secondary | ICD-10-CM | POA: Insufficient documentation

## 2018-08-10 DIAGNOSIS — M6281 Muscle weakness (generalized): Secondary | ICD-10-CM | POA: Insufficient documentation

## 2018-08-10 NOTE — Telephone Encounter (Signed)
New message:      Pt states he is returning a call from yesterday.

## 2018-08-10 NOTE — Telephone Encounter (Signed)
Discussed with [name], patient waiting on Rx from [facility]. Lars Mage RN had advised 08/04/18

## 2018-08-10 NOTE — Telephone Encounter (Signed)
-----   Message from Skeet Latch, MD sent at 07/31/2018  5:56 PM EDT ----- Cholesterol levels are minimally improved.  LDL is still >70.  Recommend adding Zetia 10mg .  Repeat lipids/CMP in 6-8 weeks.

## 2018-08-10 NOTE — Therapy (Signed)
[place], [place], [address] Phone: [phone]   Fax:  [phone]  Physical Therapy Evaluation  Patient Details  Name: [name] MRN: [id number] Date of Birth: August 16, 1946 Referring Provider (PT): Dr. Irene Limbo   Encounter Date: 08/10/2018  PT End of Session - 08/10/18 1346    Visit Number  1    Number of Visits  12    Date for PT Re-Evaluation  09/21/18    PT Start Time  1300    PT Stop Time  1345    PT Time Calculation (min)  45 min    Activity Tolerance  Patient tolerated treatment well    Behavior During Therapy  [facility] for tasks assessed/performed       Past Medical History:  Diagnosis Date  . A-fib (HCC)    hx.- sinus rhythm now  . Acid reflux   . AKI (acute kidney injury) (HCC)    mild  . Anxiety   . Arthritis   . Atrial dilatation, bilateral    Severly, enlargement  per ECHO 11/03/16  . BPH (benign prostatic hyperplasia)   . CAD (coronary artery disease)   . Depression   . Dislocation of internal right hip prosthesis, sequela   . Dysrhythmia    hx a fib  . Elevated brain natriuretic peptide (BNP) level   . Environmental allergies   . Fracture of wrist 10/23/13   LEFT  . High cholesterol   . History of Graves' disease   . History of kidney stones    surg. removal 02/2017  . History of syncope   . HLD (hyperlipidemia)   . Hypertension   . Hypothyroidism    thyroid removed  . Left knee injury    cap dislocated  . Low testosterone, possible hypogonadism 10/01/2012  . Lumbago   . Metabolic bone disease   . Multiple falls   . Osteoporosis    Severe  . Plaque psoriasis   . Sleep apnea    cpap machine-settings 17  . Thrombocytopenia ([place])   . Thyroid disease    HX GRAVES DISEASE  . Transfusion history    s/p 12'13 hip surgery  . Unsteady gait   . UTI (urinary tract infection)   . Vertebral compression fracture (HCC)    L1-wears brace    Past Surgical History:   Procedure Laterality Date  . APPENDECTOMY    . CARDIAC CATHETERIZATION  2013  . CATARACT EXTRACTION, BILATERAL    . COLONOSCOPY    . CYSTOSCOPY WITH RETROGRADE PYELOGRAM, URETEROSCOPY AND STENT PLACEMENT Left 11/07/2017   Procedure: CYSTOSCOPY WITH RETROGRADE PYELOGRAM, URETEROSCOPY AND STENT PLACEMENT;  Surgeon: Cleon Gustin, MD;  Location: [facility];  Service: Urology;  Laterality: Left;  . CYSTOSCOPY/URETEROSCOPY/HOLMIUM LASER/STENT PLACEMENT Right 03/11/2017   Procedure: CYSTOSCOPY/URETEROSCOPYSTENT PLACEMENT right ureter retrograde pylegram;  Surgeon: Cleon Gustin, MD;  Location: [facility];  Service: Urology;  Laterality: Right;  . HARDWARE REMOVAL  10/05/2012   Procedure: HARDWARE REMOVAL;  Surgeon: [name], MD;  Location: [facility];  Service: Orthopedics;  Laterality: Right;  REMOVING  STRYKER  GAMMA NAIL  . HARDWARE REMOVAL Right 07/03/2013   Procedure: HARDWARE REMOVAL RIGHT TIBIA ;  Surgeon: Rozanna Box, MD;  Location: [facility];  Service: Orthopedics;  Laterality: Right;  . HIP CLOSED REDUCTION Right 10/15/2013   Procedure: CLOSED MANIPULATION HIP;  Surgeon: [name], MD;  Location: [facility];  Service: Orthopedics;  Laterality: Right;  . hip revision  June 2017  . HOLMIUM LASER APPLICATION Right 06/18/9891   Procedure: HOLMIUM LASER APPLICATION;  Surgeon: Cleon Gustin, MD;  [facility];  Service: Urology;  Laterality: Right;  . INCISION AND DRAINAGE HIP Right 11/16/2013   Procedure: IRRIGATION AND DEBRIDEMENT RIGHT HIP;  Surgeon: [name], MD;  [facility];  Service: Orthopedics;  Laterality: Right;  . IR IMAGING GUIDED PORT INSERTION  06/22/2018  . IR URETERAL STENT LEFT NEW ACCESS W/O SEP NEPHROSTOMY CATH  10/24/2017  . JOINT REPLACEMENT     hip-right x2  . KNEE SURGERY Bilateral 2012   Total knee replacements  . NEPHROLITHOTOMY Right 02/08/2017   Procedure: NEPHROLITHOTOMY PERCUTANEOUS WITH SURGEON ACCESS;  Surgeon: Cleon Gustin, MD;   [facility];  Service: Urology;  Laterality: Right;  . NEPHROLITHOTOMY Left 10/24/2017   Procedure: NEPHROLITHOTOMY PERCUTANEOUS;  Surgeon: Cleon Gustin, MD;  [facility];  Service: Urology;  Laterality: Left;  . ORIF TIBIA FRACTURE Right 02/06/2013   Procedure: OPEN REDUCTION INTERNAL FIXATION (ORIF) TIBIA FRACTURE WITH IM ROD FIBULA;  Surgeon: Rozanna Box, MD;  Location: [facility];  Service: Orthopedics;  Laterality: Right;  . ORIF TIBIA FRACTURE Right 07/03/2013   Procedure: RIGHT TIBIA NON UNION REPAIR ;  Surgeon: Rozanna Box, MD;  Location: [place];  Service: Orthopedics;  Laterality: Right;  . ORIF WRIST FRACTURE  10/02/2012   Procedure: OPEN REDUCTION INTERNAL FIXATION (ORIF) WRIST FRACTURE;  Surgeon: Roseanne Kaufman, MD;  Location[facility];  Service: Orthopedics;  Laterality: Right;  WITH   ANTIBIOTIC  CEMENT  . ORIF WRIST FRACTURE Left 10/28/2013   Procedure: OPEN REDUCTION INTERNAL FIXATION (ORIF) WRIST FRACTURE with allograft;  Surgeon: Roseanne Kaufman, MD;  [facility];  Service: Orthopedics;  Laterality: Left;  DVR Plate  . QUADRICEPS TENDON REPAIR Left 07/15/2017   Procedure: REPAIR QUADRICEP TENDON;  Surgeon: Frederik Pear, MD;  Location: [facility] [facility];  Service: Orthopedics;  Laterality: Left;  . right femur surgery  05/2012  . THYROIDECTOMY  02/1986  . TOTAL HIP REVISION  10/05/2012   Procedure: TOTAL HIP REVISION;  Surgeon: [name], MD;  [facility];  Service: Orthopedics;  Laterality: Right;  RIGHT TOTAL HIP REVISION  . TOTAL HIP REVISION Right 09/17/2013   Procedure: REVISION RIGHT TOTAL HIP ARTHROPLASTY ;  Surgeon: [name], MD;  [facility];  Service: Orthopedics;  Laterality: Right;  . TOTAL HIP REVISION Right 10/26/2013   Procedure: REVISION RIGHT TOTAL HIP ARTHROPLASTY;  Surgeon: [name], MD;  [facility];  Service: Orthopedics;  Laterality: Right;  . TOTAL HIP REVISION  03/2016  . TOTAL KNEE REVISION Left 04/11/2017   Procedure:  TOTAL KNEE REVISION PATELLA and TIBIA;  Surgeon: Frederik Pear, MD;  Location: [place];  Service: Orthopedics;  Laterality: Left;  . WRIST FRACTURE SURGERY  05/2012    There were no vitals filed for this visit.   Subjective Assessment - 08/10/18 1302    Subjective  Pt presents to PT with decreased balance and weakness after beginning chemo.     Patient is accompained by:  Family member    Pertinent History  2013 fall resulting in a femur break on the Rt leg resulting in multiple surgeries due to failures including hip replacements another broken tibia leading to the discovery of osteoporosis and a metabolic bone disease.  After forteo injections x 2 years he is now in the osteopenia category.  Has a new scan coming up in  a few weeks.  prior to the cancer diagnosis he was able to be confident with a cane.  Then was diagnosed with diffuse non-hogkin large cell B-lymphoma 07/09/18 located in tonsils and Rt groin. Treatment consists of an infusion every 3 weeks hopefully for a total of 5, A-fib controlled, HTN controlled with medication    Limitations  Walking;Standing;House hold activities    Patient Stated Goals  get my balance back    Currently in Pain?  Yes    Pain Score  2     Pain Location  --   bil quads   Pain Orientation  Left;Right    Pain Descriptors / Indicators  Aching    Pain Type  Chronic pain    Pain Onset  More than a month ago    Pain Frequency  Constant    Aggravating Factors   sitting too long. moving too much     Pain Relieving Factors  medication, resting          [facility] PT Assessment - 08/10/18 0001      Assessment   Medical Diagnosis  diffuse large B cell lymphoma    Referring Provider (PT)  Dr. Irene Limbo    Onset Date/Surgical Date  07/06/18    Hand Dominance  Right    Next MD Visit  november    Prior Therapy  not here      Precautions   Precaution Comments  fall, osteoporosis, lymphoma      Restrictions   Weight Bearing Restrictions  No      Balance Screen    Has the patient fallen in the past 6 months  Yes    How many times?  9 weeks ago. L knee buckle     Has the patient had a decrease in activity level because of a fear of falling?   Yes    Is the patient reluctant to leave their home because of a fear of falling?   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    Leisure  theater      Cognition   Overall Cognitive Status  Within Functional Limits for tasks assessed      Coordination   Gross Motor Movements are Fluid and Coordinated  Yes      Functional Tests   Functional tests  Sit to Stand      Sit to Stand   Comments  in 30 seconds: @ 21 inches: 5 times      Posture/Postural Control   Posture/Postural Control  Postural limitations    Postural Limitations  Rounded Shoulders;Forward head;Increased thoracic kyphosis;Right pelvic obliquity    Posture Comments  Rt LE appearing longer in standing and with gait      ROM / Strength   AROM / PROM / Strength  Strength      Strength   Overall Strength Comments  knee ext R: 4-/5 cogwheel, L: 4/5 slightly cogwheel      Ambulation/Gait   Ambulation/Gait  Yes    Ambulation/Gait Assistance  6: Modified independent (Device/Increase time)    Assistive device  Rolling walker    Gait Pattern  Trendelenburg;Lateral trunk lean to right;Trunk  flexed;Poor foot clearance - left;Poor foot clearance - right    Ambulation Surface  Level      6 Minute Walk- Baseline   6 Minute Walk- Baseline  yes      6 Minute walk- Post Test   6 Minute Walk Post Test  yes      Standardized Balance Assessment   Standardized Balance Assessment  Timed Up and Go Test      Timed Up and Go Test   Normal TUG (seconds)  30   to the second tape line with RW     High  Level Balance   High Level Balance Comments  [place] balance scale performed with score of: 7 demonstrating high fall risk (score form in PT drawer)       Functional Gait  Assessment   Gait assessed   Yes                Objective measurements completed on examination: See above findings.                   PT Long Term Goals - 08/10/18 1503      PT LONG TERM GOAL #1   Title  Pt will improve sit to stand x 30sec from 21" surface to 8 times     Time  6    Period  Weeks    Status  New    Target Date  09/21/18      PT LONG TERM GOAL #2   Title  Pt will improve fullerton balance scale to at least 10/40    Time  6    Period  Weeks    Target Date  09/21/18      PT LONG TERM GOAL #3   Title  Pt will be ind with LRAD for home and community use    Time  6    Period  Weeks    Status  New    Target Date  09/21/18      PT LONG TERM GOAL #4   Title  Pt will perform TUG with LRAD at 28sec or less     Time  6    Period  Weeks    Status  New    Target Date  09/21/18      PT LONG TERM GOAL #5   Title  Pt will report no instances of Rt LE giving out x 5 days at home     Time  6    Period  Weeks    Status  New    Target Date  09/21/18             Plan - 08/10/18 1354    Clinical Impression Statement  [name] is a pleasant gentleman who presents with long history of gait and LE strength issues after 12 failed hip and femur repair attempts due to what turned out to be osteoporosis.  He reports the osteoporosis is now osteopenia due to injections he took x 2 years.  he was doing well with PT at [facility] until lymphoma diagnosis at the end of September.  After the first chemo round he needed to go back to the walker and reports increased frequency of Rt knee giving out and worsening balance.  he reports his blood counts are usually good.  He has baseline SOB now and reports he is able to tell when it is getting too bad and knows when to take a  break.  He  would like to prevent worsening of strength and balance during Chemotherapy and geting back to the cane if possible.      History and Personal Factors relevant to plan of care:  2013 fall resulting in a femur break on the Rt leg resulting in multiple surgeries due to failures including hip replacements another broken tibia leading to the discovery of osteoporosis and a metabolic bone disease. After forteo injections x 2 years he is now in the osteopenia category. Has a new scan coming up in a few weeks. prior to the cancer diagnosis he was able to be confident with a cane. Then was diagnosed with diffuse non-hogkin large cell B-lymphoma 07/09/18 located in tonsils and Rt groin. Treatment consists of an infusion every 3 weeks hopefully for a total of 5, A-fib controlled, HTN controlled with medication    Clinical Presentation  Evolving    Clinical Presentation due to:  new chemo and lymphoma status     Clinical Decision Making  Moderate    Rehab Potential  Good    PT Frequency  2x / week    PT Duration  6 weeks    PT Treatment/Interventions  ADLs/Self Care Home Management;Therapeutic exercise;Patient/family education;Gait training;Stair training;Balance training;Neuromuscular re-education    PT Next Visit Plan  start nustep, balance, and gait training, general LE strength quad included     Consulted and Agree with Plan of Care  Patient;Family member/caregiver       Patient will benefit from skilled therapeutic intervention in order to improve the following deficits and impairments:  Abnormal gait, Decreased activity tolerance, Decreased endurance, Decreased strength, Decreased balance, Difficulty walking  Visit Diagnosis: Diffuse large B-cell lymphoma, unspecified body region [facility])  Other abnormalities of gait and mobility  History of falling  Muscle weakness (generalized)     Problem List Patient Active Problem List   Diagnosis Date Noted  . Counseling regarding advance care  planning and goals of care 07/09/2018  . Diffuse large B-cell lymphoma ([place]) 06/23/2018  . Port-A-Cath in place 06/23/2018  . Hypogonadism in male 11/22/2017  . Nephrolithiasis 10/24/2017  . Recurrent dislocation of left patella 07/15/2017  . Recurrent subluxation of patella, left 07/08/2017  . S/P revision of total knee 04/11/2017  . Failed total knee, left, initial encounter ([place]) 04/09/2017  . Renal calculus, right 02/08/2017  . Protein-calorie malnutrition, severe ([place]) 12/08/2016  . Chronic obstructive pulmonary disease ([place]) 12/08/2016  . Stag[place] renal calculus 11/05/2016  . Wound drainage right hip 11/15/2013  . Expected blood loss anemia 09/18/2013  . Hyponatremia 09/18/2013  . S/P right TH revision 09/17/2013  . Nonunion, fracture, Right tibia  07/03/2013  . UTI (urinary tract infection) 07/03/2013  . Pathological fracture due to osteoporosis with delayed healing 04/24/2013  . Osteoporosis with fracture 04/24/2013  . Rash and nonspecific skin eruption 03/08/2013  . Syncope 02/21/2013  . PAF (paroxysmal atrial fibrillation) ([place]) 02/21/2013  . Chronic anticoagulation 02/21/2013  . Fall 02/08/2013  . Physical deconditioning 02/08/2013  . Low back pain radiating to both legs 02/08/2013  . Compression fracture of L1 lumbar vertebra (HCC) 02/08/2013  . Right tibial fracture 02/05/2013  . Obesity, unspecified 02/05/2013  . Osteomyelitis of right wrist ([place]) 11/02/2012  . Anemia 10/06/2012  . HTN (hypertension) 10/03/2012  . Psoriasis 10/03/2012  . Dyslipidemia 10/03/2012  . Vitamin D deficiency 10/03/2012  . GERD (gastroesophageal reflux disease) 10/03/2012  . Hyperglycemia 10/03/2012  . Anxiety 10/03/2012  . Depression 10/03/2012  . CAD (coronary artery disease) 10/03/2012  .  DJD (degenerative joint disease) 10/03/2012  . Chronic gingivitis 10/03/2012  . Low testosterone, possible hypogonadism 10/01/2012  . Normocytic anemia 09/30/2012  . Right femoral fracture  ([place]) 09/29/2012  . Right forearm fracture 09/29/2012  . Atrial fibrillation ([place]) 09/29/2012  . Hypothyroidism 09/29/2012  . History of recurrent UTIs 09/29/2012    Shan Levans, PT 08/10/2018, 3:06 PM  [place], [place], [address] Phone: [phone]   Fax:  [phone]  Name: [name] MRN: [id number] Date of Birth: [birth year]

## 2018-08-15 ENCOUNTER — Ambulatory Visit: Payer: Medicare Other | Attending: Hematology | Admitting: Physical Therapy

## 2018-08-15 ENCOUNTER — Encounter: Payer: Self-pay | Admitting: Physical Therapy

## 2018-08-15 DIAGNOSIS — C8330 Diffuse large B-cell lymphoma, unspecified site: Secondary | ICD-10-CM | POA: Insufficient documentation

## 2018-08-15 DIAGNOSIS — M6281 Muscle weakness (generalized): Secondary | ICD-10-CM | POA: Insufficient documentation

## 2018-08-15 DIAGNOSIS — Z9181 History of falling: Secondary | ICD-10-CM | POA: Insufficient documentation

## 2018-08-15 DIAGNOSIS — R2689 Other abnormalities of gait and mobility: Secondary | ICD-10-CM | POA: Diagnosis present

## 2018-08-15 NOTE — Therapy (Signed)
[place], [place], [address] Phone: [phone]   Fax:  [phone]  Physical Therapy Treatment  Patient Details  Name: [name] MRN: [id number] Date of Birth: [birth year] Referring Provider (PT): Dr. Irene Limbo   Encounter Date: 08/15/2018  PT End of Session - 08/15/18 2032    Visit Number  2    Number of Visits  12    Date for PT Re-Evaluation  09/21/18    PT Start Time  0272    PT Stop Time  1430    PT Time Calculation (min)  45 min    Activity Tolerance  Patient tolerated treatment well    Behavior During Therapy  [facility] for tasks assessed/performed       Past Medical History:  Diagnosis Date  . A-fib (HCC)    hx.- sinus rhythm now  . Acid reflux   . AKI (acute kidney injury) (HCC)    mild  . Anxiety   . Arthritis   . Atrial dilatation, bilateral    Severly, enlargement  per ECHO 11/03/16  . BPH (benign prostatic hyperplasia)   . CAD (coronary artery disease)   . Depression   . Dislocation of internal right hip prosthesis, sequela   . Dysrhythmia    hx a fib  . Elevated brain natriuretic peptide (BNP) level   . Environmental allergies   . Fracture of wrist 10/23/13   LEFT  . High cholesterol   . History of Graves' disease   . History of kidney stones    surg. removal 02/2017  . History of syncope   . HLD (hyperlipidemia)   . Hypertension   . Hypothyroidism    thyroid removed  . Left knee injury    cap dislocated  . Low testosterone, possible hypogonadism 10/01/2012  . Lumbago   . Metabolic bone disease   . Multiple falls   . Osteoporosis    Severe  . Plaque psoriasis   . Sleep apnea    cpap machine-settings 17  . Thrombocytopenia ([place])   . Thyroid disease    HX GRAVES DISEASE  . Transfusion history    s/p 12'13 hip surgery  . Unsteady gait   . UTI (urinary tract infection)   . Vertebral compression fracture (HCC)    L1-wears brace    Past Surgical History:   Procedure Laterality Date  . APPENDECTOMY    . CARDIAC CATHETERIZATION  2013  . CATARACT EXTRACTION, BILATERAL    . COLONOSCOPY    . CYSTOSCOPY WITH RETROGRADE PYELOGRAM, URETEROSCOPY AND STENT PLACEMENT Left 11/07/2017   Procedure: CYSTOSCOPY WITH RETROGRADE PYELOGRAM, URETEROSCOPY AND STENT PLACEMENT;  Surgeon: Cleon Gustin, MD;  Location: [facility];  Service: Urology;  Laterality: Left;  . CYSTOSCOPY/URETEROSCOPY/HOLMIUM LASER/STENT PLACEMENT Right 03/11/2017   Procedure: CYSTOSCOPY/URETEROSCOPYSTENT PLACEMENT right ureter retrograde pylegram;  Surgeon: Cleon Gustin, MD;  Location: [facility];  Service: Urology;  Laterality: Right;  . HARDWARE REMOVAL  10/05/2012   Procedure: HARDWARE REMOVAL;  Surgeon: [name], MD;  Location: [facility];  Service: Orthopedics;  Laterality: Right;  REMOVING  STRYKER  GAMMA NAIL  . HARDWARE REMOVAL Right 07/03/2013   Procedure: HARDWARE REMOVAL RIGHT TIBIA ;  Surgeon: Rozanna Box, MD;  Location: [facility];  Service: Orthopedics;  Laterality: Right;  . HIP CLOSED REDUCTION Right 10/15/2013   Procedure: CLOSED MANIPULATION HIP;  Surgeon: [name], MD;  Location: [facility];  Service: Orthopedics;  Laterality: Right;  . hip revision  June 2017  . HOLMIUM LASER APPLICATION Right 11/13/7626   Procedure: HOLMIUM LASER APPLICATION;  Surgeon: Cleon Gustin, MD;  Location: [facility];  Service: Urology;  Laterality: Right;  . INCISION AND DRAINAGE HIP Right 11/16/2013   Procedure: IRRIGATION AND DEBRIDEMENT RIGHT HIP;  Surgeon: [name], MD;  Location: [facility];  Service: Orthopedics;  Laterality: Right;  . IR IMAGING GUIDED PORT INSERTION  06/22/2018  . IR URETERAL STENT LEFT NEW ACCESS W/O SEP NEPHROSTOMY CATH  10/24/2017  . JOINT REPLACEMENT     hip-right x2  . KNEE SURGERY Bilateral 2012   Total knee replacements  . NEPHROLITHOTOMY Right 02/08/2017   Procedure: NEPHROLITHOTOMY PERCUTANEOUS WITH SURGEON ACCESS;  Surgeon: Cleon Gustin, MD;   Location[facility];  Service: Urology;  Laterality: Right;  . NEPHROLITHOTOMY Left 10/24/2017   Procedure: NEPHROLITHOTOMY PERCUTANEOUS;  Surgeon: Cleon Gustin, MD;  [facility];  Service: Urology;  Laterality: Left;  . ORIF TIBIA FRACTURE Right 02/06/2013   Procedure: OPEN REDUCTION INTERNAL FIXATION (ORIF) TIBIA FRACTURE WITH IM ROD FIBULA;  Surgeon: Rozanna Box, MD;  Location: [facility];  Service: Orthopedics;  Laterality: Right;  . ORIF TIBIA FRACTURE Right 07/03/2013   Procedure: RIGHT TIBIA NON UNION REPAIR ;  Surgeon: Rozanna Box, MD;  Location: [facility];  Service: Orthopedics;  Laterality: Right;  . ORIF WRIST FRACTURE  10/02/2012   Procedure: OPEN REDUCTION INTERNAL FIXATION (ORIF) WRIST FRACTURE;  Surgeon: Roseanne Kaufman, MD;  Location[facility];  Service: Orthopedics;  Laterality: Right;  WITH   ANTIBIOTIC  CEMENT  . ORIF WRIST FRACTURE Left 10/28/2013   Procedure: OPEN REDUCTION INTERNAL FIXATION (ORIF) WRIST FRACTURE with allograft;  Surgeon: Roseanne Kaufman, MD;  Location[facility];  Service: Orthopedics;  Laterality: Left;  DVR Plate  . QUADRICEPS TENDON REPAIR Left 07/15/2017   Procedure: REPAIR QUADRICEP TENDON;  Surgeon: Frederik Pear, MD;  Location: [facility];  Service: Orthopedics;  Laterality: Left;  . right femur surgery  05/2012  . THYROIDECTOMY  02/1986  . TOTAL HIP REVISION  10/05/2012   Procedure: TOTAL HIP REVISION;  Surgeon: [name], MD;  [facility];  Service: Orthopedics;  Laterality: Right;  RIGHT TOTAL HIP REVISION  . TOTAL HIP REVISION Right 09/17/2013   Procedure: REVISION RIGHT TOTAL HIP ARTHROPLASTY ;  Surgeon: [name], MD;  Location[facility];  Service: Orthopedics;  Laterality: Right;  . TOTAL HIP REVISION Right 10/26/2013   Procedure: REVISION RIGHT TOTAL HIP ARTHROPLASTY;  Surgeon: [name], MD;  Location[facility];  Service: Orthopedics;  Laterality: Right;  . TOTAL HIP REVISION  03/2016  . TOTAL KNEE REVISION Left 04/11/2017   Procedure:  TOTAL KNEE REVISION PATELLA and TIBIA;  Surgeon: Frederik Pear, MD;  Location: [facility];  Service: Orthopedics;  Laterality: Left;  . WRIST FRACTURE SURGERY  05/2012    There were no vitals filed for this visit.  Subjective Assessment - 08/15/18 1350    Subjective  Pt retells his story of multiple leg procedures and course of medical procedures and facilites and progress. He expresses fear and resentment at having to go back to using the walker.  He has had 3 chemos and has 2 more to go.  He had chemo last week and so this is his second week     Pertinent History  2013 fall resulting in a femur break on the Rt leg resulting in multiple surgeries due to failures including hip replacements another broken tibia  leading to the discovery of osteoporosis and a metabolic bone disease.  After forteo injections x 2 years he is now in the osteopenia category.  Has a new scan coming up in a few weeks.  prior to the cancer diagnosis he was able to be confident with a cane.  Then was diagnosed with diffuse non-hogkin large cell B-lymphoma 07/09/18 located in tonsils and Rt groin. Treatment consists of an infusion every 3 weeks hopefully for a total of 5, A-fib controlled, HTN controlled with medication                       [facility] Adult PT Treatment/Exercise - 08/15/18 0001      Exercises   Exercises  Shoulder;Elbow;Lumbar;Knee/Hip;Ankle      Lumbar Exercises: Seated   Other Seated Lumbar Exercises  sitting on red round disc: pelvic tilts and circles. with stops and changing speedes. hands together and hip to hip with empahsis on core stability., elbow curls with  3# pounds altenating, focus on pressing feet into floor       Knee/Hip Exercises: Supine   Short Arc Quad Sets  Strengthening;Right;Left;2 sets;10 reps    Short Arc Quad Sets Limitations  on set with 3# one set with 6 pounds     Bridges  AROM;Both    Bridges Limitations  pt had difficulty with trunk stability     Other Supine Knee/Hip  Exercises  red theraband around both knees for hip abduction x 5     Other Supine Knee/Hip Exercises  unilateral calms x 5 on each leg with slow control       Shoulder Exercises: Standing   Row  Strengthening;Right;Left;10 reps    Theraband Level (Shoulder Row)  Level 2 (Red)    Row Limitations  emphasis on core stability and extensor strengthening                  PT Long Term Goals - 08/10/18 1503      PT LONG TERM GOAL #1   Title  Pt will improve sit to stand x 30sec from 21" surface to 8 times     Time  6    Period  Weeks    Status  New    Target Date  09/21/18      PT LONG TERM GOAL #2   Title  Pt will improve [place] balance scale to at least 10/40    Time  6    Period  Weeks    Target Date  09/21/18      PT LONG TERM GOAL #3   Title  Pt will be ind with LRAD for home and community use    Time  6    Period  Weeks    Status  New    Target Date  09/21/18      PT LONG TERM GOAL #4   Title  Pt will perform TUG with LRAD at 28sec or less     Time  6    Period  Weeks    Status  New    Target Date  09/21/18      PT LONG TERM GOAL #5   Title  Pt will report no instances of Rt LE giving out x 5 days at home     Time  6    Period  Weeks    Status  New    Target Date  09/21/18  Plan - 08/15/18 2033    Clinical Impression Statement  Focused on core stability today and beginning LE stregnthening in supine. Pt has some difficulty with core stabalization and will work on that at home.  He wants to have a very detailed home program     PT Treatment/Interventions  ADLs/Self Care Home Management;Therapeutic exercise;Patient/family education;Gait training;Stair training;Balance training;Neuromuscular re-education    PT Next Visit Plan  Provide Medbridge HEP for supine and sidelying core and LE . When ready, start nustep, balance, and gait training, general LE strength quad included     Consulted and Agree with Plan of Care  Patient       Patient  will benefit from skilled therapeutic intervention in order to improve the following deficits and impairments:     Visit Diagnosis: Other abnormalities of gait and mobility  History of falling  Muscle weakness (generalized)     Problem List Patient Active Problem List   Diagnosis Date Noted  . Counseling regarding advance care planning and goals of care 07/09/2018  . Diffuse large B-cell lymphoma ([place]) 06/23/2018  . Port-A-Cath in place 06/23/2018  . Hypogonadism in male 11/22/2017  . Nephrolithiasis 10/24/2017  . Recurrent dislocation of left patella 07/15/2017  . Recurrent subluxation of patella, left 07/08/2017  . S/P revision of total knee 04/11/2017  . Failed total knee, left, initial encounter ([place]) 04/09/2017  . Renal calculus, right 02/08/2017  . Protein-calorie malnutrition, severe ([place]) 12/08/2016  . Chronic obstructive pulmonary disease ([place]) 12/08/2016  . Staghorn renal calculus 11/05/2016  . Wound drainage right hip 11/15/2013  . Expected blood loss anemia 09/18/2013  . Hyponatremia 09/18/2013  . S/P right TH revision 09/17/2013  . Nonunion, fracture, Right tibia  07/03/2013  . UTI (urinary tract infection) 07/03/2013  . Pathological fracture due to osteoporosis with delayed healing 04/24/2013  . Osteoporosis with fracture 04/24/2013  . Rash and nonspecific skin eruption 03/08/2013  . Syncope 02/21/2013  . PAF (paroxysmal atrial fibrillation) ([place]) 02/21/2013  . Chronic anticoagulation 02/21/2013  . Fall 02/08/2013  . Physical deconditioning 02/08/2013  . Low back pain radiating to both legs 02/08/2013  . Compression fracture of L1 lumbar vertebra (HCC) 02/08/2013  . Right tibial fracture 02/05/2013  . Obesity, unspecified 02/05/2013  . Osteomyelitis of right wrist ([place]) 11/02/2012  . Anemia 10/06/2012  . HTN (hypertension) 10/03/2012  . Psoriasis 10/03/2012  . Dyslipidemia 10/03/2012  . Vitamin D deficiency 10/03/2012  . GERD (gastroesophageal reflux  disease) 10/03/2012  . Hyperglycemia 10/03/2012  . Anxiety 10/03/2012  . Depression 10/03/2012  . CAD (coronary artery disease) 10/03/2012  . DJD (degenerative joint disease) 10/03/2012  . Chronic gingivitis 10/03/2012  . Low testosterone, possible hypogonadism 10/01/2012  . Normocytic anemia 09/30/2012  . Right femoral fracture ([place]) 09/29/2012  . Right forearm fracture 09/29/2012  . Atrial fibrillation ([place]) 09/29/2012  . Hypothyroidism 09/29/2012  . History of recurrent UTIs 09/29/2012  Donato Heinz. Owens Shark PT   Norwood Levo 08/15/2018, 8:38 PM  [place], [place], [address] Phone: [phone]   Fax:  [phone]  Name: [name] MRN: [id number] Date of Birth: 08-18-1946

## 2018-08-15 NOTE — Patient Instructions (Signed)
Bridges up to 10 reps  Stand with band around the doorknob and close the door.  Stand keep elbows straight, pull ends of band back to hips.  Go slowly.

## 2018-08-16 ENCOUNTER — Other Ambulatory Visit: Payer: Self-pay | Admitting: Internal Medicine

## 2018-08-17 ENCOUNTER — Encounter: Payer: Self-pay | Admitting: Cardiovascular Disease

## 2018-08-17 ENCOUNTER — Encounter: Payer: Self-pay | Admitting: Rehabilitation

## 2018-08-17 ENCOUNTER — Ambulatory Visit: Payer: Medicare Other | Admitting: Rehabilitation

## 2018-08-17 ENCOUNTER — Ambulatory Visit (INDEPENDENT_AMBULATORY_CARE_PROVIDER_SITE_OTHER): Payer: Medicare Other | Admitting: Cardiovascular Disease

## 2018-08-17 VITALS — BP 135/69 | HR 81 | Ht 75.0 in | Wt 304.4 lb

## 2018-08-17 DIAGNOSIS — Z9181 History of falling: Secondary | ICD-10-CM

## 2018-08-17 DIAGNOSIS — I119 Hypertensive heart disease without heart failure: Secondary | ICD-10-CM

## 2018-08-17 DIAGNOSIS — R2689 Other abnormalities of gait and mobility: Secondary | ICD-10-CM | POA: Diagnosis not present

## 2018-08-17 DIAGNOSIS — I4819 Other persistent atrial fibrillation: Secondary | ICD-10-CM

## 2018-08-17 DIAGNOSIS — Z5181 Encounter for therapeutic drug level monitoring: Secondary | ICD-10-CM

## 2018-08-17 DIAGNOSIS — E7800 Pure hypercholesterolemia, unspecified: Secondary | ICD-10-CM

## 2018-08-17 DIAGNOSIS — C8330 Diffuse large B-cell lymphoma, unspecified site: Secondary | ICD-10-CM

## 2018-08-17 DIAGNOSIS — M6281 Muscle weakness (generalized): Secondary | ICD-10-CM

## 2018-08-17 NOTE — Therapy (Signed)
[place], [place], [address] Phone: [phone]   Fax:  [phone]  Physical Therapy Treatment  Patient Details  Name: [name] MRN: [id number] Date of Birth: Sep 07, 1946 Referring Provider (PT): Dr. Irene Limbo   Encounter Date: 08/17/2018  PT End of Session - 08/17/18 1518    Visit Number  3    Number of Visits  12    Date for PT Re-Evaluation  09/21/18    PT Start Time  3016    PT Stop Time  1555    PT Time Calculation (min)  40 min    Activity Tolerance  Patient tolerated treatment well    Behavior During Therapy  [facility] for tasks assessed/performed       Past Medical History:  Diagnosis Date  . A-fib (HCC)    hx.- sinus rhythm now  . Acid reflux   . AKI (acute kidney injury) (HCC)    mild  . Anxiety   . Arthritis   . Atrial dilatation, bilateral    Severly, enlargement  per ECHO 11/03/16  . BPH (benign prostatic hyperplasia)   . CAD (coronary artery disease)   . Depression   . Dislocation of internal right hip prosthesis, sequela   . Dysrhythmia    hx a fib  . Elevated brain natriuretic peptide (BNP) level   . Environmental allergies   . Fracture of wrist 10/23/13   LEFT  . High cholesterol   . History of Graves' disease   . History of kidney stones    surg. removal 02/2017  . History of syncope   . HLD (hyperlipidemia)   . Hypertension   . Hypothyroidism    thyroid removed  . Left knee injury    cap dislocated  . Low testosterone, possible hypogonadism 10/01/2012  . Lumbago   . Metabolic bone disease   . Multiple falls   . Osteoporosis    Severe  . Plaque psoriasis   . Sleep apnea    cpap machine-settings 17  . Thrombocytopenia ([place])   . Thyroid disease    HX GRAVES DISEASE  . Transfusion history    s/p 12'13 hip surgery  . Unsteady gait   . UTI (urinary tract infection)   . Vertebral compression fracture (HCC)    L1-wears brace    Past Surgical History:   Procedure Laterality Date  . APPENDECTOMY    . CARDIAC CATHETERIZATION  2013  . CATARACT EXTRACTION, BILATERAL    . COLONOSCOPY    . CYSTOSCOPY WITH RETROGRADE PYELOGRAM, URETEROSCOPY AND STENT PLACEMENT Left 11/07/2017   Procedure: CYSTOSCOPY WITH RETROGRADE PYELOGRAM, URETEROSCOPY AND STENT PLACEMENT;  Surgeon: Cleon Gustin, MD;  Location: [facility];  Service: Urology;  Laterality: Left;  . CYSTOSCOPY/URETEROSCOPY/HOLMIUM LASER/STENT PLACEMENT Right 03/11/2017   Procedure: CYSTOSCOPY/URETEROSCOPYSTENT PLACEMENT right ureter retrograde pylegram;  Surgeon: Cleon Gustin, MD;  Location: [facility];  Service: Urology;  Laterality: Right;  . HARDWARE REMOVAL  10/05/2012   Procedure: HARDWARE REMOVAL;  Surgeon: [name], MD;  Location[facility];  Service: Orthopedics;  Laterality: Right;  REMOVING  STRYKER  GAMMA NAIL  . HARDWARE REMOVAL Right 07/03/2013   Procedure: HARDWARE REMOVAL RIGHT TIBIA ;  Surgeon: Rozanna Box, MD;  Location: [place];  Service: Orthopedics;  Laterality: Right;  . HIP CLOSED REDUCTION Right 10/15/2013   Procedure: CLOSED MANIPULATION HIP;  Surgeon: [name], MD;  [facility];  Service: Orthopedics;  Laterality: Right;  . hip revision  June 2017  . HOLMIUM LASER APPLICATION Right 10/16/1759   Procedure: HOLMIUM LASER APPLICATION;  Surgeon: Cleon Gustin, MD;  [facility];  Service: Urology;  Laterality: Right;  . INCISION AND DRAINAGE HIP Right 11/16/2013   Procedure: IRRIGATION AND DEBRIDEMENT RIGHT HIP;  Surgeon: [name], MD;  [facility];  Service: Orthopedics;  Laterality: Right;  . IR IMAGING GUIDED PORT INSERTION  06/22/2018  . IR URETERAL STENT LEFT NEW ACCESS W/O SEP NEPHROSTOMY CATH  10/24/2017  . JOINT REPLACEMENT     hip-right x2  . KNEE SURGERY Bilateral 2012   Total knee replacements  . NEPHROLITHOTOMY Right 02/08/2017   Procedure: NEPHROLITHOTOMY PERCUTANEOUS WITH SURGEON ACCESS;  Surgeon: Cleon Gustin, MD;   [facility];  Service: Urology;  Laterality: Right;  . NEPHROLITHOTOMY Left 10/24/2017   Procedure: NEPHROLITHOTOMY PERCUTANEOUS;  Surgeon: Cleon Gustin, MD;  [facility];  Service: Urology;  Laterality: Left;  . ORIF TIBIA FRACTURE Right 02/06/2013   Procedure: OPEN REDUCTION INTERNAL FIXATION (ORIF) TIBIA FRACTURE WITH IM ROD FIBULA;  Surgeon: Rozanna Box, MD;  Location: [place];  Service: Orthopedics;  Laterality: Right;  . ORIF TIBIA FRACTURE Right 07/03/2013   Procedure: RIGHT TIBIA NON UNION REPAIR ;  Surgeon: Rozanna Box, MD;  Location: [place];  Service: Orthopedics;  Laterality: Right;  . ORIF WRIST FRACTURE  10/02/2012   Procedure: OPEN REDUCTION INTERNAL FIXATION (ORIF) WRIST FRACTURE;  Surgeon: Roseanne Kaufman, MD;  [facility];  Service: Orthopedics;  Laterality: Right;  WITH   ANTIBIOTIC  CEMENT  . ORIF WRIST FRACTURE Left 10/28/2013   Procedure: OPEN REDUCTION INTERNAL FIXATION (ORIF) WRIST FRACTURE with allograft;  Surgeon: Roseanne Kaufman, MD;  [facility];  Service: Orthopedics;  Laterality: Left;  DVR Plate  . QUADRICEPS TENDON REPAIR Left 07/15/2017   Procedure: REPAIR QUADRICEP TENDON;  Surgeon: Frederik Pear, MD;  Location: [place];  Service: Orthopedics;  Laterality: Left;  . right femur surgery  05/2012  . THYROIDECTOMY  02/1986  . TOTAL HIP REVISION  10/05/2012   Procedure: TOTAL HIP REVISION;  Surgeon: [name], MD;  [facility];  Service: Orthopedics;  Laterality: Right;  RIGHT TOTAL HIP REVISION  . TOTAL HIP REVISION Right 09/17/2013   Procedure: REVISION RIGHT TOTAL HIP ARTHROPLASTY ;  Surgeon: [name], MD;  [facility];  Service: Orthopedics;  Laterality: Right;  . TOTAL HIP REVISION Right 10/26/2013   Procedure: REVISION RIGHT TOTAL HIP ARTHROPLASTY;  Surgeon: [name], MD;  [facility];  Service: Orthopedics;  Laterality: Right;  . TOTAL HIP REVISION  03/2016  . TOTAL KNEE REVISION Left 04/11/2017   Procedure:  TOTAL KNEE REVISION PATELLA and TIBIA;  Surgeon: Frederik Pear, MD;  Location: [place];  Service: Orthopedics;  Laterality: Left;  . WRIST FRACTURE SURGERY  05/2012    There were no vitals filed for this visit.  Subjective Assessment - 08/17/18 1510    Subjective  My nerves are bad today.  This is also my third thing that I 've done today.  I went to church and heart MD already.  My legs have really started bothering me since leaving the heart doctor.  the heart check up was good.      Patient is accompained by:  Family member    Pertinent History  2013 fall resulting in a femur break on the Rt leg resulting in multiple surgeries due to failures including hip replacements another broken  tibia leading to the discovery of osteoporosis and a metabolic bone disease.  After forteo injections x 2 years he is now in the osteopenia category.  Has a new scan coming up in a few weeks.  prior to the cancer diagnosis he was able to be confident with a cane.  Then was diagnosed with diffuse non-hogkin large cell B-lymphoma 07/09/18 located in tonsils and Rt groin. Treatment consists of an infusion every 3 weeks hopefully for a total of 5, A-fib controlled, HTN controlled with medication    Limitations  Walking;Standing;House hold activities    Patient Stated Goals  get my balance back    Currently in Pain?  No/denies   with walking yes                      [facility] Adult PT Treatment/Exercise - 08/17/18 0001      Ambulation/Gait   Gait Comments  attempted to walk over to nustep but this was too far for patient.  We made it to the first plinth table on the Lt side and then turned around.  pt was able to walk to his car in the parking lot from that point.        Exercises   Exercises  Knee/Hip      Knee/Hip Exercises: Seated   Long Arc Quad  Both;10 reps;2 sets    Long Arc Quad Weight  3 lbs.    Long [employer] Limitations  5 seconds holds, Lt LE extended as much as possible, some inc SOB with Lt  LE due to difficulty     Marching  10 reps    Marching Limitations  one set on bed, one on red disc    Marching Weights  3 lbs.    Sit to [employer]  Other (comment);without UE support   8 times until quad fatigue                 PT Long Term Goals - 08/10/18 1503      PT LONG TERM GOAL #1   Title  Pt will improve sit to stand x 30sec from 21" surface to 8 times     Time  6    Period  Weeks    Status  New    Target Date  09/21/18      PT LONG TERM GOAL #2   Title  Pt will improve [place] balance scale to at least 10/40    Time  6    Period  Weeks    Target Date  09/21/18      PT LONG TERM GOAL #3   Title  Pt will be ind with LRAD for home and community use    Time  6    Period  Weeks    Status  New    Target Date  09/21/18      PT LONG TERM GOAL #4   Title  Pt will perform TUG with LRAD at 28sec or less     Time  6    Period  Weeks    Status  New    Target Date  09/21/18      PT LONG TERM GOAL #5   Title  Pt will report no instances of Rt LE giving out x 5 days at home     Time  6    Period  Weeks    Status  New    Target Date  09/21/18  Plan - 08/17/18 1556    Clinical Impression Statement  Gave pt detailed initial HEP today.  He tolerated treatment well needing rest breaks for SOB and fatigue.  We attempted to walk over to the Nustep on ortho side and made it to the 1st plinth table on the left before pt reported it was too far away.  Pt describing a mild panic attack today and feeling a bit jittery from this.      PT Frequency  2x / week    PT Duration  6 weeks    PT Treatment/Interventions  ADLs/Self Care Home Management;Therapeutic exercise;Patient/family education;Gait training;Stair training;Balance training;Neuromuscular re-education    PT Next Visit Plan  continue gait tolerance/endurance with RW, work on turns, continue LE strength working towards balance    Consulted and Agree with Plan of Care  Patient       Patient will  benefit from skilled therapeutic intervention in order to improve the following deficits and impairments:  Abnormal gait, Decreased activity tolerance, Decreased endurance, Decreased strength, Decreased balance, Difficulty walking  Visit Diagnosis: Other abnormalities of gait and mobility  History of falling  Muscle weakness (generalized)  Diffuse large B-cell lymphoma, unspecified body region [facility])     Problem List Patient Active Problem List   Diagnosis Date Noted  . Counseling regarding advance care planning and goals of care 07/09/2018  . Diffuse large B-cell lymphoma ([place]) 06/23/2018  . Port-A-Cath in place 06/23/2018  . Hypogonadism in male 11/22/2017  . Nephrolithiasis 10/24/2017  . Recurrent dislocation of left patella 07/15/2017  . Recurrent subluxation of patella, left 07/08/2017  . S/P revision of total knee 04/11/2017  . Failed total knee, left, initial encounter ([place]) 04/09/2017  . Renal calculus, right 02/08/2017  . Protein-calorie malnutrition, severe ([place]) 12/08/2016  . Chronic obstructive pulmonary disease ([place]) 12/08/2016  . Staghorn renal calculus 11/05/2016  . Wound drainage right hip 11/15/2013  . Expected blood loss anemia 09/18/2013  . Hyponatremia 09/18/2013  . S/P right TH revision 09/17/2013  . Nonunion, fracture, Right tibia  07/03/2013  . UTI (urinary tract infection) 07/03/2013  . Pathological fracture due to osteoporosis with delayed healing 04/24/2013  . Osteoporosis with fracture 04/24/2013  . Rash and nonspecific skin eruption 03/08/2013  . Syncope 02/21/2013  . PAF (paroxysmal atrial fibrillation) ([place]) 02/21/2013  . Chronic anticoagulation 02/21/2013  . Fall 02/08/2013  . Physical deconditioning 02/08/2013  . Low back pain radiating to both legs 02/08/2013  . Compression fracture of L1 lumbar vertebra (HCC) 02/08/2013  . Right tibial fracture 02/05/2013  . Obesity, unspecified 02/05/2013  . Osteomyelitis of right wrist ([place])  11/02/2012  . Anemia 10/06/2012  . HTN (hypertension) 10/03/2012  . Psoriasis 10/03/2012  . Dyslipidemia 10/03/2012  . Vitamin D deficiency 10/03/2012  . GERD (gastroesophageal reflux disease) 10/03/2012  . Hyperglycemia 10/03/2012  . Anxiety 10/03/2012  . Depression 10/03/2012  . CAD (coronary artery disease) 10/03/2012  . DJD (degenerative joint disease) 10/03/2012  . Chronic gingivitis 10/03/2012  . Low testosterone, possible hypogonadism 10/01/2012  . Normocytic anemia 09/30/2012  . Right femoral fracture ([place]) 09/29/2012  . Right forearm fracture 09/29/2012  . Atrial fibrillation ([place]) 09/29/2012  . Hypothyroidism 09/29/2012  . History of recurrent UTIs 09/29/2012    Shan Levans, PT 08/17/2018, 3:58 PM  [place], [place], [address] Phone: [phone]   Fax:  [phone]  Name: [name] MRN: [id number] Date of Birth: 1946/04/25

## 2018-08-17 NOTE — Patient Instructions (Addendum)
Access Code: [facility]  URL: [email or web address]  Date: 08/17/2018  Prepared by: Shan Levans   Exercises  Supine Bridge - 10 reps - 1 sets - 1x daily - 7x weekly  Standing Low Shoulder Row with Anchored Resistance - 10 reps - 1 sets - 1x daily - 7x weekly  Standing Heel Raise with Support - 10 reps - 1 sets - 1x daily - 7x weekly  Standing Alternating Knee Flexion - 10 reps - 1 sets - 1x daily - 7x weekly  Seated Long Arc Quad - 10 reps - 1 sets - hold 5 seconds hold - 1x daily - 7x weekly  Walking in home or outside as much as possible

## 2018-08-17 NOTE — Patient Instructions (Signed)
Medication Instructions:  Your physician recommends that you continue on your current medications as directed. Please refer to the Current Medication list given to you today.  If you need a refill on your cardiac medications before your next appointment, please call your pharmacy.   Lab work: FASTING LIPID PANEL IN 3 MONTHS  If you have labs (blood work) drawn today and your tests are completely normal, you will receive your results only by: [name] MyChart Message (if you have MyChart) OR . A paper copy in the mail If you have any lab test that is abnormal or we need to change your treatment, we will call you to review the results.  Testing/Procedures: NONE   Follow-Up: At [facility], you and your health needs are our priority.  As part of our continuing mission to provide you with exceptional heart care, we have created designated Provider Care Teams.  These Care Teams include your primary Cardiologist (physician) and Advanced Practice Providers (APPs -  Physician Assistants and Nurse Practitioners) who all work together to provide you with the care you need, when you need it. You will need a follow up appointment in 6 months.  Please call our office 2 months in advance to schedule this appointment.  You may see  or one of the following Advanced Practice Providers on your designated Care Team:   Kerin Ransom, PA-C Roby Lofts, [place] . Sande Rives, PA-C  .

## 2018-08-17 NOTE — Progress Notes (Signed)
Cardiology Office Note   Date:  08/17/2018   ID:  Joseph, [name] Dec 25, 1945, MRN [id number]  PCP:  Cassandria Anger, MD  Cardiologist:   Skeet Latch, MD   No chief complaint on file.   History of Present Illness: Joseph [name] is a 72 y.o. male with CAD, long-standing-persistent atrial fibrillation, hypertension, hyperlipidemia, sleep apnea on CPAP, diffuse large B cell lymphoma, hypothyroidism and prior tobacco abuse here for follow-up.  He was initially seen 05/2017 to establish care.  Joseph [name] was previously a [name] of [name] and last saw him 01/2016.  At that time he was preparing for left hip surgery.  [name] recommended stress testing but he declined.  He underwent surgery with no complications.  He was initially diagnosed with atrial fibrillation around 2012.  He was started on diltiazem and amiodarone.  He was noted to be in persistent atrial fibrillation.  Therefore amiodarone was discontinued. At Joseph time of his atrial fibrillation diagnosis he also underwent a heart catheterization. He reportedly had 90% obstruction of an unknown vessel that had developed collaterals. He was started on Ranexa.  He had an echo 11/03/16 revealed LVEF of 60-65%. Severe left and right atrial enlargement was noted and Joseph IVC was dilated.  He had no angina and so Ranexa was discontinued.  Consideration was made for starting a beta-blocker instead of a calcium channel blocker.  However we decided not to make too many changes at one time.  Joseph [name] was diagnosed with diffuse large B cell lymphoma on 05/2018.  He had his first of 6 cycles of chemotherapy with rituximab, cyclophosphamide, doxorubicin, vincristine, and prednisone on 9/13.  He had a baseline echo 06/2018 that revealed LVEF 50 to 55% and normal diastolic function.  He had apical hypokinesis and GLS was -13.9.  At his last appointment Joseph [name]'s heart rate was poorly controlled so metoprolol was added to his  regimen.  His heart rate has been controlled and he is feeling stable from a cardiac standpoint.  He has no chest pain and has some exertional dyspnea that is unchanged.  He has completed 3 cycles of chemotherapy.  His main complaint is aching in his bones that is attributable to Joseph [name].  He also is feeling weak and has had some episodes of his knees giving out.  He is walking with a walker, though he did fall one time.  Dr. Irene Limbo prescribed physical therapy which he thinks is helping.  He has not had any lower extremity edema, orthopnea, or PND.  He denies palpitations, lightheadedness, or dizziness.   Past Medical History:  Diagnosis Date  . A-fib (HCC)    hx.- sinus rhythm now  . Acid reflux   . AKI (acute kidney injury) (HCC)    mild  . Anxiety   . Arthritis   . Atrial dilatation, bilateral    Severly, enlargement  per ECHO 11/03/16  . BPH (benign prostatic hyperplasia)   . CAD (coronary artery disease)   . Depression   . Dislocation of internal right hip prosthesis, sequela   . Dysrhythmia    hx a fib  . Elevated brain natriuretic peptide (BNP) level   . Environmental allergies   . Fracture of wrist 10/23/13   LEFT  . High cholesterol   . History of Graves' disease   . History of kidney stones    surg. removal 02/2017  . History of syncope   . HLD (hyperlipidemia)   .  Hypertension   . Hypothyroidism    thyroid removed  . Left knee injury    cap dislocated  . Low testosterone, possible hypogonadism 10/01/2012  . Lumbago   . Metabolic bone disease   . Multiple falls   . Osteoporosis    Severe  . Plaque psoriasis   . Sleep apnea    cpap machine-settings 17  . Thrombocytopenia ([place])   . Thyroid disease    HX GRAVES DISEASE  . Transfusion history    s/p 12'13 hip surgery  . Unsteady gait   . UTI (urinary tract infection)   . Vertebral compression fracture (HCC)    L1-wears brace    Past Surgical History:  Procedure Laterality Date  . APPENDECTOMY    .  CARDIAC CATHETERIZATION  2013  . CATARACT EXTRACTION, BILATERAL    . COLONOSCOPY    . CYSTOSCOPY WITH RETROGRADE PYELOGRAM, URETEROSCOPY AND STENT PLACEMENT Left 11/07/2017   Procedure: CYSTOSCOPY WITH RETROGRADE PYELOGRAM, URETEROSCOPY AND STENT PLACEMENT;  Surgeon: Cleon Gustin, MD;  Location: [facility];  Service: Urology;  Laterality: Left;  . CYSTOSCOPY/URETEROSCOPY/HOLMIUM LASER/STENT PLACEMENT Right 03/11/2017   Procedure: CYSTOSCOPY/URETEROSCOPYSTENT PLACEMENT right ureter retrograde pylegram;  Surgeon: Cleon Gustin, MD;  Location: [facility];  Service: Urology;  Laterality: Right;  . HARDWARE REMOVAL  10/05/2012   Procedure: HARDWARE REMOVAL;  Surgeon: [name], MD;  Location: [facility];  Service: Orthopedics;  Laterality: Right;  REMOVING  STRYKER  GAMMA NAIL  . HARDWARE REMOVAL Right 07/03/2013   Procedure: HARDWARE REMOVAL RIGHT TIBIA ;  Surgeon: Rozanna Box, MD;  Location: [place];  Service: Orthopedics;  Laterality: Right;  . HIP CLOSED REDUCTION Right 10/15/2013   Procedure: CLOSED MANIPULATION HIP;  Surgeon: [name], MD;  Location: [facility];  Service: Orthopedics;  Laterality: Right;  . hip revision     June 2017  . HOLMIUM LASER APPLICATION Right 5/0/5397   Procedure: HOLMIUM LASER APPLICATION;  Surgeon: Cleon Gustin, MD;  Location: [facility];  Service: Urology;  Laterality: Right;  . INCISION AND DRAINAGE HIP Right 11/16/2013   Procedure: IRRIGATION AND DEBRIDEMENT RIGHT HIP;  Surgeon: [name], MD;  Location: [facility];  Service: Orthopedics;  Laterality: Right;  . IR IMAGING GUIDED PORT INSERTION  06/22/2018  . IR URETERAL STENT LEFT NEW ACCESS W/O SEP NEPHROSTOMY CATH  10/24/2017  . JOINT REPLACEMENT     hip-right x2  . KNEE SURGERY Bilateral 2012   Total knee replacements  . NEPHROLITHOTOMY Right 02/08/2017   Procedure: NEPHROLITHOTOMY PERCUTANEOUS WITH SURGEON ACCESS;  Surgeon: Cleon Gustin, MD;  [facility];  Service: Urology;  Laterality: Right;   . NEPHROLITHOTOMY Left 10/24/2017   Procedure: NEPHROLITHOTOMY PERCUTANEOUS;  Surgeon: Cleon Gustin, MD;  [facility];  Service: Urology;  Laterality: Left;  . ORIF TIBIA FRACTURE Right 02/06/2013   Procedure: OPEN REDUCTION INTERNAL FIXATION (ORIF) TIBIA FRACTURE WITH IM ROD FIBULA;  Surgeon: Rozanna Box, MD;  Location: [place];  Service: Orthopedics;  Laterality: Right;  . ORIF TIBIA FRACTURE Right 07/03/2013   Procedure: RIGHT TIBIA NON UNION REPAIR ;  Surgeon: Rozanna Box, MD;  Location: [place];  Service: Orthopedics;  Laterality: Right;  . ORIF WRIST FRACTURE  10/02/2012   Procedure: OPEN REDUCTION INTERNAL FIXATION (ORIF) WRIST FRACTURE;  Surgeon: Roseanne Kaufman, MD;  Location[facility];  Service: Orthopedics;  Laterality: Right;  WITH   ANTIBIOTIC  CEMENT  . ORIF WRIST FRACTURE Left 10/28/2013   Procedure:  OPEN REDUCTION INTERNAL FIXATION (ORIF) WRIST FRACTURE with allograft;  Surgeon: Roseanne Kaufman, MD;  Location[facility];  Service: Orthopedics;  Laterality: Left;  DVR Plate  . QUADRICEPS TENDON REPAIR Left 07/15/2017   Procedure: REPAIR QUADRICEP TENDON;  Surgeon: Frederik Pear, MD;  Location: [place];  Service: Orthopedics;  Laterality: Left;  . right femur surgery  05/2012  . THYROIDECTOMY  02/1986  . TOTAL HIP REVISION  10/05/2012   Procedure: TOTAL HIP REVISION;  Surgeon: [name], MD;  [facility];  Service: Orthopedics;  Laterality: Right;  RIGHT TOTAL HIP REVISION  . TOTAL HIP REVISION Right 09/17/2013   Procedure: REVISION RIGHT TOTAL HIP ARTHROPLASTY ;  Surgeon: [name], MD;  [facility];  Service: Orthopedics;  Laterality: Right;  . TOTAL HIP REVISION Right 10/26/2013   Procedure: REVISION RIGHT TOTAL HIP ARTHROPLASTY;  Surgeon: [name], MD;  [facility];  Service: Orthopedics;  Laterality: Right;  . TOTAL HIP REVISION  03/2016  . TOTAL KNEE REVISION Left 04/11/2017   Procedure: TOTAL KNEE REVISION PATELLA and TIBIA;  Surgeon: Frederik Pear, MD;  Location: [place];  Service: Orthopedics;  Laterality: Left;  . WRIST FRACTURE SURGERY  05/2012     Current Outpatient Medications  Medication Sig Dispense Refill  . alfuzosin (UROXATRAL) 10 MG 24 hr tablet Take 10 mg by mouth at bedtime.    [name] amoxicillin (AMOXIL) 500 MG capsule Take 2,000 mg by mouth See admin instructions. Takes 4 capsules 1 hour prior to dental procedures    . atorvastatin (LIPITOR) 80 MG tablet Take 80 mg by mouth daily.    [name] buPROPion (WELLBUTRIN XL) 300 MG 24 hr tablet TAKE 1 TABLET DAILY WITH  BREAKFAST BY MOUTH. ([name] taking differently: Take 300 mg by mouth daily. ) 90 tablet 3  . Calcium Citrate-Vitamin D (CALCIUM CITRATE + D PO) Take 1 tablet by mouth 2 (two) times daily.     . Cholecalciferol (VITAMIN D-3 PO) Take 2,000 Units by mouth 2 (two) times daily.     . clonazePAM (KLONOPIN) 1 MG tablet Take 1 tablet (1 mg total) by mouth every 6 (six) hours as needed for anxiety. TAKE 1 TABLET BY MOUTH  EVERY 6 HOURS prn 360 tablet 1  . denosumab (PROLIA) 60 MG/ML SOLN injection Inject 60 mg into Joseph skin every 6 (six) months. Administer in upper arm, thigh, or abdomen    . diltiazem (CARDIZEM CD) 240 MG 24 hr capsule TAKE 1 CAPSULE BY MOUTH  DAILY 90 capsule 3  . doxycycline (VIBRA-TABS) 100 MG tablet TAKE 1 TABLET BY MOUTH  DAILY 90 tablet 1  . DULoxetine (CYMBALTA) 60 MG capsule TAKE 1 CAPSULE BY MOUTH  TWICE DAILY 180 capsule 1  . EPINEPHrine (EPIPEN 2-PAK) 0.3 mg/0.3 mL IJ SOAJ injection Inject 0.3 mg into Joseph muscle daily as needed (for anaphylactic reaction).    [name] etanercept (ENBREL) 50 MG/ML injection Inject 50 mg into Joseph skin 2 (two) times a week. Tuesday & Friday    . ezetimibe (ZETIA) 10 MG tablet Take 1 tablet (10 mg total) by mouth daily. 90 tablet 3  . ferrous sulfate 325 (65 FE) MG tablet Take 325 mg by mouth 2 (two) times daily with a meal.    . HYDROcodone-acetaminophen (NORCO) 7.5-325 MG tablet Take 1 tablet by mouth 4 (four) times daily as  needed for moderate pain. Please fill on 08/02/18 120 tablet 0  . ipratropium (ATROVENT) 0.03 % nasal spray Place 1  spray into Joseph nose daily as needed.    [name] levothyroxine (SYNTHROID, LEVOTHROID) 200 MCG tablet TAKE 1 TABLET DAILY AT 6 AM BY MOUTH. ([name] taking differently: Take 225 mcg by mouth daily before breakfast. ) 90 tablet 3  . levothyroxine (SYNTHROID, LEVOTHROID) 25 MCG tablet TAKE 1 TABLET BY MOUTH  DAILY BEFORE BREAKFAST. ([name] taking differently: Take 225 mcg by mouth daily before breakfast. ) 90 tablet 3  . lidocaine-prilocaine (EMLA) cream Apply to affected area once 30 g 3  . loratadine (CLARITIN) 10 MG tablet Take 10 mg by mouth daily.     [name] LORazepam (ATIVAN) 0.5 MG tablet Take 1 tablet (0.5 mg total) by mouth every 6 (six) hours as needed (Nausea or vomiting). 30 tablet 0  . metoprolol tartrate (LOPRESSOR) 25 MG tablet Take 1 tablet (25 mg total) by mouth 2 (two) times daily. 60 tablet 3  . mirabegron ER (MYRBETRIQ) 25 MG TB24 tablet Take 25 mg by mouth at bedtime.    . Multiple Vitamin (MULTIVITAMIN WITH MINERALS) TABS tablet Take 1 tablet by mouth daily. Men's One-A-Day 27+    . nitroGLYCERIN (NITROSTAT) 0.4 MG SL tablet Place 0.4 mg under Joseph tongue every 5 (five) minutes as needed for chest pain. x3 doses as needed for chest pain    . omeprazole (PRILOSEC) 40 MG capsule TAKE 1 CAPSULE DAILY BEFORE BREAKFAST BY MOUTH. ([name] taking differently: Take 40 mg by mouth daily. ) 90 capsule 3  . ondansetron (ZOFRAN) 8 MG tablet Take 1 tablet (8 mg total) by mouth 2 (two) times daily as needed for refractory nausea / vomiting. chemotherapy. 30 tablet 1  . predniSONE (DELTASONE) 20 MG tablet Take 3 tablets (60 mg total) by mouth daily. Take on days 1-5 of chemotherapy. 50 tablet 0  . PROAIR HFA 108 (90 Base) MCG/ACT inhaler Inhale 1 puff into Joseph lungs every 4 (four) hours as needed.    . Probiotic Product (PROBIOTIC PO) Take 1 capsule by mouth daily with breakfast.    .  prochlorperazine (COMPAZINE) 10 MG tablet Take 1 tablet (10 mg total) by mouth every 6 (six) hours as needed (Nausea or vomiting). 30 tablet 6  . testosterone enanthate (DELATESTRYL) 200 MG/ML injection INJECT 1ML (200MG  TOTAL) INTO Joseph MUSCLE EVERY 14 DAYS. FOR INTRAMUSCULAR USE ONLY. DISCARD VIAL AFTER 28 DAYS. 5 mL 4  . tiZANidine (ZANAFLEX) 4 MG tablet TAKE 1 TABLET BY MOUTH  EVERY 6 HOURS AS NEEDED FOR MUSCLE SPASM(S) ([name] taking differently: Take 4 mg by mouth every 6 (six) hours as needed for muscle spasms. ) 360 tablet 1  . triamcinolone ointment (KENALOG) 0.5 % APPLY TO AFFECTED AREA TWICE A DAY. 30 g 0  . vitamin C (ASCORBIC ACID) 500 MG tablet Take 500 mg by mouth daily.    Alveda Reasons 10 MG TABS tablet TAKE 1 TABLET DAILY BY  MOUTH. 90 tablet 3   No current facility-administered medications for this visit.     Allergies:   Short ragweed pollen ext    Social History:  Joseph [name]  reports that he quit smoking about 6 years ago. He has never used smokeless tobacco. He reports that he drinks alcohol. He reports that he does not use drugs.   Family History:  Joseph [name]'s family history includes Alcohol abuse in his father and sister; Arthritis in his mother; Asthma in his father; CAD (age of onset: 94) in his father.    ROS:  Please see Joseph history of present illness.  Otherwise, review of systems are positive for none.   All other systems are reviewed and negative.    PHYSICAL EXAM: VS:  BP 135/69   Pulse 81   Ht 6\' 3"  (1.905 m)   Wt (!) 304 lb 6.4 oz (138.1 kg)   BMI 38.05 kg/m  , BMI Body mass index is 38.05 kg/m. GENERAL:  Chronically ill-appearing HEENT: Pupils equal round and reactive, fundi not visualized, oral mucosa unremarkable NECK:  No jugular venous distention, waveform within normal limits, carotid upstroke brisk and symmetric, no bruits LUNGS:  Clear to auscultation bilaterally HEART:  irregularly irregular.  PMI not displaced or sustained,S1 and S2 within  normal limits, no S3, no S4, no clicks, no rubs, no murmurs ABD:  Flat, positive bowel sounds normal in frequency in pitch, no bruits, no rebound, no guarding, no midline pulsatile mass, no hepatomegaly, no splenomegaly EXT:  2 plus pulses throughout, no edema, no cyanosis no clubbing SKIN:  No rashes no nodules NEURO:  Cranial nerves II through XII grossly intact, motor grossly intact throughout PSYCH:  Cognitively intact, oriented to person place and time   EKG:  EKG is not ordered today. Joseph ekg ordered today demonstrates Atrial fibrillation. Rate 71 bpm. Left axis deviation. Nonspecific IVCD. 12/26/17: Atrial fibrillation.  Rate 95 bpm.  Cannot rule out prior anterior infarct. 07/04/18: Atrial fibrillation.  Rate 118 bpm.  Right axis deviation.   Echo 11/03/16: Study Conclusions  - Left ventricle: Systolic function was normal. Joseph estimated   ejection fraction was in Joseph range of 60% to 65%. Joseph study is   not technically sufficient to allow evaluation of LV diastolic   function. - Left atrium: Severely dilated. - Right atrium: Severely dilated.  Impressions:  - Technically difficult study with poor echo windows. Joseph LVEF is   grossly normal at 60-65%, there is severe biatrial enlargment,   Joseph IVC is dilated.  Recent Labs: 08/04/2018: ALT 41; BUN 31; Creatinine 1.18; Hemoglobin 14.5; Magnesium 1.6; Platelets 160; Potassium 4.6; Sodium 141    Lipid Panel    Component Value Date/Time   CHOL 144 07/13/2018 0000   TRIG 72 07/13/2018 0000   HDL 47 07/13/2018 0000   CHOLHDL 3.0 12/29/2017 1138   CHOLHDL 1.8 07/06/2013 1025   VLDL 10 07/06/2013 1025   LDLCALC 83 07/13/2018 0000      Wt Readings from Last 3 Encounters:  08/17/18 (!) 304 lb 6.4 oz (138.1 kg)  08/04/18 (!) 302 lb 9.6 oz (137.3 kg)  07/13/18 298 lb 11.2 oz (135.5 kg)      ASSESSMENT AND PLAN:  # Longstanding persistent atrial fibrillation: Rates are better controlled since adding metoprolol.   Continue diltiazem and Xarelto.  Blood counts remained stable on chemotherapy.  # CAD: # Hyperlipidemia: Joseph [name] reportedly has obstructive coronary artery disease with evidence of collateralization. He has never experienced any chest pain and this catheterization was reportedly performed for atrial fibrillation.  Ranexa was discontinued and he remains chest pain free.  Continue atorvastatin.  His LDL remained above goal so Zetia was added.  He will have repeat lipids drawn in 3 months.  His LFTs are monitored frequently with his chemotherapy.  # Hypertension:  Blood pressure is controlled.  Continue diltiazem and metoprolol.  # Morbid obesity:  Encouraged him to work with physical therapy as prescribed and try to walk when able.  # OSA: Continue CPAP   Current medicines are reviewed at length with Joseph [name] today.  Joseph [name] does  not have concerns regarding medicines.  Joseph following changes have been made: None  Labs/ tests ordered today include:   Orders Placed This Encounter  Procedures  . Lipid panel     Disposition:   FU with Joseph Musich C. Oval Linsey, MD, [facility] in 6 months.     Signed, Joseph Welborn C. Oval Linsey, MD, [facility]  08/17/2018 2:24 PM    [place]

## 2018-08-18 ENCOUNTER — Encounter: Payer: Medicare Other | Admitting: Internal Medicine

## 2018-08-22 ENCOUNTER — Telehealth: Payer: Self-pay | Admitting: *Deleted

## 2018-08-22 ENCOUNTER — Ambulatory Visit: Payer: Medicare Other | Admitting: Physical Therapy

## 2018-08-22 ENCOUNTER — Ambulatory Visit (HOSPITAL_COMMUNITY)
Admission: RE | Admit: 2018-08-22 | Discharge: 2018-08-22 | Disposition: A | Discharge status: Home / Self Care | Payer: Medicare Other | Source: Ambulatory Visit | Attending: Hematology | Admitting: Hematology

## 2018-08-22 ENCOUNTER — Ambulatory Visit (HOSPITAL_COMMUNITY): Payer: Medicare Other

## 2018-08-22 ENCOUNTER — Encounter: Payer: Self-pay | Admitting: Physical Therapy

## 2018-08-22 DIAGNOSIS — C8330 Diffuse large B-cell lymphoma, unspecified site: Secondary | ICD-10-CM | POA: Diagnosis not present

## 2018-08-22 DIAGNOSIS — Z9181 History of falling: Secondary | ICD-10-CM

## 2018-08-22 DIAGNOSIS — R2689 Other abnormalities of gait and mobility: Secondary | ICD-10-CM | POA: Diagnosis not present

## 2018-08-22 DIAGNOSIS — M6281 Muscle weakness (generalized): Secondary | ICD-10-CM

## 2018-08-22 LAB — GLUCOSE, CAPILLARY: GLUCOSE-CAPILLARY: 93 mg/dL (ref 70–99)

## 2018-08-22 MED ORDER — FLUDEOXYGLUCOSE F - 18 (FDG) INJECTION: 15.1000 | Freq: Once | INTRAVENOUS | Status: DC | PRN

## 2018-08-22 NOTE — Telephone Encounter (Signed)
Medical records faxed to [place]; release [phone]

## 2018-08-22 NOTE — Telephone Encounter (Signed)
Medical records faxed to [place]; release [phone]

## 2018-08-22 NOTE — Patient Instructions (Signed)
  PELVIC TILT  Lie on back, legs bent. Exhale, tilting top of pelvis back, pubic bone up, to flatten lower back. Inhale, rolling pelvis opposite way, top forward, pubic bone down, arch in back. Repeat __10__ times. Do __2__ sessions per day. Copyright  VHI. All rights reserved.     Lie with hips and knees bent. Slowly inhale, and then exhale. Pull navel toward spine and tighten pelvic floor. Hold for __10_ seconds. Continue to breathe in and out during hold. Rest for _10__ seconds. Repeat __10_ times. Do __2-3_ times a day.   Copyright  VHI. All rights reserved.  Knee Fold   Lie on back, legs bent, arms by sides. Exhale, lifting knee to chest. Inhale, returning. Keep abdominals flat, navel to spine. Repeat __10__ times, alternating legs. Do __2__ sessions per day.  Copyright  VHI. All rights reserved.  Knee Drop   Keep pelvis stable. Without rotating hips, slowly drop knee to side, pause, return to center, bring knee across midline toward opposite hip. Feel obliques engaging. Repeat for ___10_ times each leg.  Copyright  VHI. All rights reserved.  Isometric Hold With Pelvic Floor (Hook-Lying)    Copyright  VHI. All rights reserved.  Heel Slide to Straight   Slide one leg down to straight. Return. Be sure pelvis does not rock forward, tilt, rotate, or tip to side. Do _10__ times. Restabilize pelvis. Repeat with other leg. Do __1-2_ sets, __2_ times per day.  [email or web address].exer.[email or web address]   Copyright  VHI. All rights reserved.   

## 2018-08-22 NOTE — Therapy (Signed)
[place], [place], [address] Phone: [phone]   Fax:  [phone]  Physical Therapy Treatment  Patient Details  Name: [name] MRN: [id number] Date of Birth: 10-Aug-1946 Referring Provider (PT): Dr. Irene Limbo   Encounter Date: 08/22/2018  PT End of Session - 08/22/18 1600    Visit Number  4    Number of Visits  12    Date for PT Re-Evaluation  09/21/18    PT Start Time  6962    PT Stop Time  1600    PT Time Calculation (min)  45 min    Activity Tolerance  Patient tolerated treatment well    Behavior During Therapy  [facility] for tasks assessed/performed       Past Medical History:  Diagnosis Date  . A-fib (HCC)    hx.- sinus rhythm now  . Acid reflux   . AKI (acute kidney injury) (HCC)    mild  . Anxiety   . Arthritis   . Atrial dilatation, bilateral    Severly, enlargement  per ECHO 11/03/16  . BPH (benign prostatic hyperplasia)   . CAD (coronary artery disease)   . Depression   . Dislocation of internal right hip prosthesis, sequela   . Dysrhythmia    hx a fib  . Elevated brain natriuretic peptide (BNP) level   . Environmental allergies   . Fracture of wrist 10/23/13   LEFT  . High cholesterol   . History of Graves' disease   . History of kidney stones    surg. removal 02/2017  . History of syncope   . HLD (hyperlipidemia)   . Hypertension   . Hypothyroidism    thyroid removed  . Left knee injury    cap dislocated  . Low testosterone, possible hypogonadism 10/01/2012  . Lumbago   . Metabolic bone disease   . Multiple falls   . Osteoporosis    Severe  . Plaque psoriasis   . Sleep apnea    cpap machine-settings 17  . Thrombocytopenia ([place])   . Thyroid disease    HX GRAVES DISEASE  . Transfusion history    s/p 12'13 hip surgery  . Unsteady gait   . UTI (urinary tract infection)   . Vertebral compression fracture (HCC)    L1-wears brace    Past Surgical History:   Procedure Laterality Date  . APPENDECTOMY    . CARDIAC CATHETERIZATION  2013  . CATARACT EXTRACTION, BILATERAL    . COLONOSCOPY    . CYSTOSCOPY WITH RETROGRADE PYELOGRAM, URETEROSCOPY AND STENT PLACEMENT Left 11/07/2017   Procedure: CYSTOSCOPY WITH RETROGRADE PYELOGRAM, URETEROSCOPY AND STENT PLACEMENT;  Surgeon: Cleon Gustin, MD;  Location: [facility];  Service: Urology;  Laterality: Left;  . CYSTOSCOPY/URETEROSCOPY/HOLMIUM LASER/STENT PLACEMENT Right 03/11/2017   Procedure: CYSTOSCOPY/URETEROSCOPYSTENT PLACEMENT right ureter retrograde pylegram;  Surgeon: Cleon Gustin, MD;  Location: [facility];  Service: Urology;  Laterality: Right;  . HARDWARE REMOVAL  10/05/2012   Procedure: HARDWARE REMOVAL;  Surgeon: [name], MD;  Location: [facility];  Service: Orthopedics;  Laterality: Right;  REMOVING  STRYKER  GAMMA NAIL  . HARDWARE REMOVAL Right 07/03/2013   Procedure: HARDWARE REMOVAL RIGHT TIBIA ;  Surgeon: Rozanna Box, MD;  Location: [place];  Service: Orthopedics;  Laterality: Right;  . HIP CLOSED REDUCTION Right 10/15/2013   Procedure: CLOSED MANIPULATION HIP;  Surgeon: [name], MD;  Location: [facility];  Service: Orthopedics;  Laterality: Right;  . hip revision  June 2017  . HOLMIUM LASER APPLICATION Right 02/11/6269   Procedure: HOLMIUM LASER APPLICATION;  Surgeon: Cleon Gustin, MD;  Location: [facility];  Service: Urology;  Laterality: Right;  . INCISION AND DRAINAGE HIP Right 11/16/2013   Procedure: IRRIGATION AND DEBRIDEMENT RIGHT HIP;  Surgeon: [name], MD;  Location: [facility];  Service: Orthopedics;  Laterality: Right;  . IR IMAGING GUIDED PORT INSERTION  06/22/2018  . IR URETERAL STENT LEFT NEW ACCESS W/O SEP NEPHROSTOMY CATH  10/24/2017  . JOINT REPLACEMENT     hip-right x2  . KNEE SURGERY Bilateral 2012   Total knee replacements  . NEPHROLITHOTOMY Right 02/08/2017   Procedure: NEPHROLITHOTOMY PERCUTANEOUS WITH SURGEON ACCESS;  Surgeon: Cleon Gustin, MD;   [facility];  Service: Urology;  Laterality: Right;  . NEPHROLITHOTOMY Left 10/24/2017   Procedure: NEPHROLITHOTOMY PERCUTANEOUS;  Surgeon: Cleon Gustin, MD;  [facility];  Service: Urology;  Laterality: Left;  . ORIF TIBIA FRACTURE Right 02/06/2013   Procedure: OPEN REDUCTION INTERNAL FIXATION (ORIF) TIBIA FRACTURE WITH IM ROD FIBULA;  Surgeon: Rozanna Box, MD;  Location: [place];  Service: Orthopedics;  Laterality: Right;  . ORIF TIBIA FRACTURE Right 07/03/2013   Procedure: RIGHT TIBIA NON UNION REPAIR ;  Surgeon: Rozanna Box, MD;  Location: [place];  Service: Orthopedics;  Laterality: Right;  . ORIF WRIST FRACTURE  10/02/2012   Procedure: OPEN REDUCTION INTERNAL FIXATION (ORIF) WRIST FRACTURE;  Surgeon: Roseanne Kaufman, MD;  [facility];  Service: Orthopedics;  Laterality: Right;  WITH   ANTIBIOTIC  CEMENT  . ORIF WRIST FRACTURE Left 10/28/2013   Procedure: OPEN REDUCTION INTERNAL FIXATION (ORIF) WRIST FRACTURE with allograft;  Surgeon: Roseanne Kaufman, MD;  [facility];  Service: Orthopedics;  Laterality: Left;  DVR Plate  . QUADRICEPS TENDON REPAIR Left 07/15/2017   Procedure: REPAIR QUADRICEP TENDON;  Surgeon: Frederik Pear, MD;  Location: [place];  Service: Orthopedics;  Laterality: Left;  . right femur surgery  05/2012  . THYROIDECTOMY  02/1986  . TOTAL HIP REVISION  10/05/2012   Procedure: TOTAL HIP REVISION;  Surgeon: [name], MD;  [facility];  Service: Orthopedics;  Laterality: Right;  RIGHT TOTAL HIP REVISION  . TOTAL HIP REVISION Right 09/17/2013   Procedure: REVISION RIGHT TOTAL HIP ARTHROPLASTY ;  Surgeon: [name], MD;  [facility];  Service: Orthopedics;  Laterality: Right;  . TOTAL HIP REVISION Right 10/26/2013   Procedure: REVISION RIGHT TOTAL HIP ARTHROPLASTY;  Surgeon: [name], MD;  [facility];  Service: Orthopedics;  Laterality: Right;  . TOTAL HIP REVISION  03/2016  . TOTAL KNEE REVISION Left 04/11/2017   Procedure:  TOTAL KNEE REVISION PATELLA and TIBIA;  Surgeon: Frederik Pear, MD;  Location: [place];  Service: Orthopedics;  Laterality: Left;  . WRIST FRACTURE SURGERY  05/2012    There were no vitals filed for this visit.  Subjective Assessment - 08/22/18 1517    Subjective  "I lost my balance and it scares the fool out of me"  Pt states he never knows when one leg might go out a bit and the other one does it too. " " Basically all I have done since I was here last time was walk" Pts partner is going in for a revision of neck fusion tomorrow.     Pertinent History  2013 fall resulting in a femur break on the Rt leg resulting in multiple surgeries due to failures including hip  replacements another broken tibia leading to the discovery of osteoporosis and a metabolic bone disease.  After forteo injections x 2 years he is now in the osteopenia category.  Has a new scan coming up in a few weeks.  prior to the cancer diagnosis he was able to be confident with a cane.  Then was diagnosed with diffuse non-hogkin large cell B-lymphoma 07/09/18 located in tonsils and Rt groin. Treatment consists of an infusion every 3 weeks hopefully for a total of 5, A-fib controlled, HTN controlled with medication    Patient Stated Goals  get my balance back    Currently in Pain?  No/denies                       [facility] Adult PT Treatment/Exercise - 08/22/18 0001      Exercises   Exercises  Shoulder;Elbow;Lumbar;Knee/Hip      Elbow Exercises   Elbow Flexion  AROM;Right;Left;15 reps    Bar Weights/Barbell (Elbow Flexion)  5 lbs    Elbow Extension  Strengthening;Right;Left;10 reps;Seated;Bar weights/barbell    Theraband Level (Elbow Extension)  Level 4 (Blue)      Lumbar Exercises: Supine   Pelvic Tilt  10 reps    Pelvic Tilt Limitations  10 reps with pelvic floor up     Clam  10 reps;Limitations    Clam Limitations  cues to keep core stable     Heel Slides  5 reps;10 reps    Heel Slides Limitations  can only do 5  on right leg     Bent Knee Raise  10 reps    Bridge  10 reps    Bridge Limitations  cues to keep core stable       Shoulder Exercises: Supine   Protraction  Right;Left;5 reps;Weights    Protraction Weight (lbs)  5    Protraction Limitations  pt neede cues for technique for exercise       Shoulder Exercises: Seated   Horizontal ABduction  Strengthening;Right;Left;10 reps;Theraband    Theraband Level (Shoulder Horizontal ABduction)  Level 4 (Blue)    Other Seated Exercises  bicep curls with 5 # with cues to keep core stable.x 10 reps                  PT Long Term Goals - 08/10/18 1503      PT LONG TERM GOAL #1   Title  Pt will improve sit to stand x 30sec from 21" surface to 8 times     Time  6    Period  Weeks    Status  New    Target Date  09/21/18      PT LONG TERM GOAL #2   Title  Pt will improve [place] balance scale to at least 10/40    Time  6    Period  Weeks    Target Date  09/21/18      PT LONG TERM GOAL #3   Title  Pt will be ind with LRAD for home and community use    Time  6    Period  Weeks    Status  New    Target Date  09/21/18      PT LONG TERM GOAL #4   Title  Pt will perform TUG with LRAD at 28sec or less     Time  6    Period  Weeks    Status  New    Target Date  09/21/18  PT LONG TERM GOAL #5   Title  Pt will report no instances of Rt LE giving out x 5 days at home     Time  6    Period  Weeks    Status  New    Target Date  09/21/18            Plan - 08/22/18 1603    Clinical Impression Statement  Treatment today focused on core exercise as pt states he has been doing lots of walking. Weakness in right leg evident with exercises today     PT Treatment/Interventions  ADLs/Self Care Home Management;Therapeutic exercise;Patient/family education;Gait training;Stair training;Balance training;Neuromuscular re-education    PT Next Visit Plan  work on left leg balance as well as strengthening on right leg, core work. Continue  gait tolerance/endurance with RW, work on turns, continue LE strength working towards Teacher, music and Agree with Plan of Care  Patient       Patient will benefit from skilled therapeutic intervention in order to improve the following deficits and impairments:     Visit Diagnosis: Other abnormalities of gait and mobility  History of falling  Muscle weakness (generalized)     Problem List Patient Active Problem List   Diagnosis Date Noted  . Counseling regarding advance care planning and goals of care 07/09/2018  . Diffuse large B-cell lymphoma ([place]) 06/23/2018  . Port-A-Cath in place 06/23/2018  . Hypogonadism in male 11/22/2017  . Nephrolithiasis 10/24/2017  . Recurrent dislocation of left patella 07/15/2017  . Recurrent subluxation of patella, left 07/08/2017  . S/P revision of total knee 04/11/2017  . Failed total knee, left, initial encounter ([place]) 04/09/2017  . Renal calculus, right 02/08/2017  . Protein-calorie malnutrition, severe ([place]) 12/08/2016  . Chronic obstructive pulmonary disease ([place]) 12/08/2016  . Staghorn renal calculus 11/05/2016  . Wound drainage right hip 11/15/2013  . Expected blood loss anemia 09/18/2013  . Hyponatremia 09/18/2013  . S/P right TH revision 09/17/2013  . Nonunion, fracture, Right tibia  07/03/2013  . UTI (urinary tract infection) 07/03/2013  . Pathological fracture due to osteoporosis with delayed healing 04/24/2013  . Osteoporosis with fracture 04/24/2013  . Rash and nonspecific skin eruption 03/08/2013  . Syncope 02/21/2013  . PAF (paroxysmal atrial fibrillation) ([place]) 02/21/2013  . Chronic anticoagulation 02/21/2013  . Fall 02/08/2013  . Physical deconditioning 02/08/2013  . Low back pain radiating to both legs 02/08/2013  . Compression fracture of L1 lumbar vertebra (HCC) 02/08/2013  . Right tibial fracture 02/05/2013  . Obesity, unspecified 02/05/2013  . Osteomyelitis of right wrist ([place]) 11/02/2012  . Anemia  10/06/2012  . HTN (hypertension) 10/03/2012  . Psoriasis 10/03/2012  . Dyslipidemia 10/03/2012  . Vitamin D deficiency 10/03/2012  . GERD (gastroesophageal reflux disease) 10/03/2012  . Hyperglycemia 10/03/2012  . Anxiety 10/03/2012  . Depression 10/03/2012  . CAD (coronary artery disease) 10/03/2012  . DJD (degenerative joint disease) 10/03/2012  . Chronic gingivitis 10/03/2012  . Low testosterone, possible hypogonadism 10/01/2012  . Normocytic anemia 09/30/2012  . Right femoral fracture ([place]) 09/29/2012  . Right forearm fracture 09/29/2012  . Atrial fibrillation ([place]) 09/29/2012  . Hypothyroidism 09/29/2012  . History of recurrent UTIs 09/29/2012   Donato Heinz. Owens Shark PT  Norwood Levo 08/22/2018, 8:33 PM  [place], [place], [address] Phone: [phone]   Fax:  [phone]  Name: [name] MRN: [id number] Date of Birth: 1946-08-22

## 2018-08-23 ENCOUNTER — Encounter: Payer: Medicare Other | Admitting: Internal Medicine

## 2018-08-24 ENCOUNTER — Ambulatory Visit: Payer: Medicare Other | Admitting: Rehabilitation

## 2018-08-25 ENCOUNTER — Other Ambulatory Visit: Payer: Medicare Other

## 2018-08-25 ENCOUNTER — Ambulatory Visit: Payer: Medicare Other

## 2018-08-25 ENCOUNTER — Ambulatory Visit: Payer: Medicare Other | Admitting: Hematology

## 2018-08-27 ENCOUNTER — Other Ambulatory Visit: Payer: Self-pay | Admitting: Internal Medicine

## 2018-08-28 ENCOUNTER — Other Ambulatory Visit: Payer: Self-pay | Admitting: *Deleted

## 2018-08-28 ENCOUNTER — Ambulatory Visit: Payer: Medicare Other | Admitting: Rehabilitation

## 2018-08-28 ENCOUNTER — Encounter: Payer: Self-pay | Admitting: Rehabilitation

## 2018-08-28 ENCOUNTER — Ambulatory Visit: Payer: Medicare Other

## 2018-08-28 DIAGNOSIS — Z9181 History of falling: Secondary | ICD-10-CM

## 2018-08-28 DIAGNOSIS — R2689 Other abnormalities of gait and mobility: Secondary | ICD-10-CM

## 2018-08-28 DIAGNOSIS — C8330 Diffuse large B-cell lymphoma, unspecified site: Secondary | ICD-10-CM

## 2018-08-28 DIAGNOSIS — M6281 Muscle weakness (generalized): Secondary | ICD-10-CM

## 2018-08-28 NOTE — Progress Notes (Signed)
HEMATOLOGY/ONCOLOGY CLINIC NOTE  Date of Service: 08/29/2018  Patient Care Team: Plotnikov, Evie Lacks, MD as PCP - General (Internal Medicine)  CHIEF COMPLAINTS/PURPOSE OF CONSULTATION:  Follow Up for Diffuse Large B-Cell Lymphoma   HISTORY OF PRESENTING ILLNESS:   [name] is a wonderful 72 y.o. male who has been referred to [place] by Dr. Lew Dawes for evaluation and management of Diffuse Large B-Cell Lymphoma. He is accompanied today by his partner. The pt reports that he is doing well overall.   The pt reports that he presented to ENT Dr. Pollie Friar care after developing a cough and a new nodule in his mouth. He notes that the symptoms presented about a month ago and have not progressed or significantly worsened. He notes that he has frequent sinus drainage, and felt that this enlargement could initially be related to this. He denies any changes in his voice and difficulty breathing.   The pt notes that he is finishing three months of PT tomorrow after surgeries on his right leg and left knee. He notes that he takes blood thinners for Afib and uses his CPAP regularly now. The pt also receives testosterone injections, 200mg  every other week. The pt notes that he had several surgeries bilaterally to remove large kidney stones in the past. He notes that his thyroid replacement has been stable. His vertebral compression fracture was in 2013, after a fall in which he also broke his right femur and wrist. The pt also takes Prolia every 6 months for his osteoporosis. He has had 18 surgeries on his right leg.   He notes that his psoriasis has been stable and takes Enbrel twice a week, and has taken this for 5 years. He was on 10 years of Humara before this. He was on Methotrexate prior to [facility].   The pt notes that he functions okay at home and is able to drive himself and cook for himself and feels that he can function independently with limitations on lifting heavier objects.   On  review of systems, pt reports recent cough, left tonsil mass, stable energy levels, and denies fevers, chills, night sweats, unexpected weight loss, difficulty breathing, difficulty swallowing, noticing any other lumps or bumps, pain along the spine, recent infections, abdominal pains, difficulty urinating, leg swelling, and any other symptoms.  . On Social Hx the pt reports that he smoked cigarettes for many years until he quit 6 years ago, and drinks ETOH socially.    INTERVAL HISTORY   [name] is here for management, evaluation, and C4D1 R-CHOP treatment of his Diffuse Large B-Cell Lymphoma. The patient's last visit with [place] was on 08/04/18. The pt reports that he is doing well overall.   The pt reports that he has felt a little more tired in the last 4-5 days. He attributes this to the fact that he has been caring for his partner who had a surgery this past week. The pt notes that he has been eating well and sleeping well. He continues functioning well and independently.   The pt notes that he has been enjoying twice weekly Physical Therapy very much and is very glad to have begun this in the interim. However, he notes that his right knee has become less stable and he is now ambulating with a walker.   The pt notes improvement in the tingling and numbness in his fingertips and denies any new neuropathy in his feet.   The pt notes that he has not  developed any new urinary symptoms and denies any discomfort passing urine. He notes that he his pre-existing urinary frequency has possibly worsened. He feels that he is able to empty his bladder. He saw his urologist two weeks ago and is maintaining an active conversation about these symptoms, and denies any UTI or concern for obstruction, or enlarged prostate.   Of note since the patient's last visit, pt has had a PET/CT completed on 08/22/18 with results revealing Interval response to therapy. Overall Deauville criteria 3. Interval decrease  in size and FDG uptake associated with palatine tonsil lesions. There has been resolution of previous left level-II and bilateral inguinal lymph nodes. 2. Enlarged subcutaneous lesion superficial to the lower right sternocleidomastoid muscle exhibits decreased FDG uptake. Currently Deauville criteria 2. 3. No new or progressive sites of disease identified. 4.  Aortic Atherosclerosis. 5. Multi vessel coronary artery atherosclerotic calcifications. 6. Unchanged vertebra plana deformity. 7. Kidney stones.  Lab results today (08/28/18) of CBC w/diff, CMP is as follows: all values are WNL except for RDW at 182, Abs immature granulocytes at 0.10k, Glucose at 132, BUN at 26, Creatinine at 1.41, Albumin at 3.3, ALT at 50, GFR at 48. 08/29/18 Magnesium at 1.6  On review of systems, pt reports eating well, sleeping well, feeling tired, improved fingertip neuropathy, and denies concerns for infections, fevers, chills, difficulty urinating, painful urinating, difficulty swallowing, mouth sores, abdominal pains, and any other symptoms.   MEDICAL HISTORY:  Past Medical History:  Diagnosis Date  . A-fib (HCC)    hx.- sinus rhythm now  . Acid reflux   . AKI (acute kidney injury) (HCC)    mild  . Anxiety   . Arthritis   . Atrial dilatation, bilateral    Severly, enlargement  per ECHO 11/03/16  . BPH (benign prostatic hyperplasia)   . CAD (coronary artery disease)   . Depression   . Dislocation of internal right hip prosthesis, sequela   . Dysrhythmia    hx a fib  . Elevated brain natriuretic peptide (BNP) level   . Environmental allergies   . Fracture of wrist 10/23/13   LEFT  . High cholesterol   . History of Graves' disease   . History of kidney stones    surg. removal 02/2017  . History of syncope   . HLD (hyperlipidemia)   . Hypertension   . Hypothyroidism    thyroid removed  . Left knee injury    cap dislocated  . Low testosterone, possible hypogonadism 10/01/2012  . Lumbago   . Metabolic  bone disease   . Multiple falls   . Osteoporosis    Severe  . Plaque psoriasis   . Sleep apnea    cpap machine-settings 17  . Thrombocytopenia ([place])   . Thyroid disease    HX GRAVES DISEASE  . Transfusion history    s/p 12'13 hip surgery  . Unsteady gait   . UTI (urinary tract infection)   . Vertebral compression fracture (HCC)    L1-wears brace    SURGICAL HISTORY: Past Surgical History:  Procedure Laterality Date  . APPENDECTOMY    . CARDIAC CATHETERIZATION  2013  . CATARACT EXTRACTION, BILATERAL    . COLONOSCOPY    . CYSTOSCOPY WITH RETROGRADE PYELOGRAM, URETEROSCOPY AND STENT PLACEMENT Left 11/07/2017   Procedure: CYSTOSCOPY WITH RETROGRADE PYELOGRAM, URETEROSCOPY AND STENT PLACEMENT;  Surgeon: Cleon Gustin, MD;  Location: [facility];  Service: Urology;  Laterality: Left;  . CYSTOSCOPY/URETEROSCOPY/HOLMIUM LASER/STENT PLACEMENT Right 03/11/2017   Procedure:  CYSTOSCOPY/URETEROSCOPYSTENT PLACEMENT right ureter retrograde pylegram;  Surgeon: Cleon Gustin, MD;  Location: [facility];  Service: Urology;  Laterality: Right;  . HARDWARE REMOVAL  10/05/2012   Procedure: HARDWARE REMOVAL;  Surgeon: [name], MD;  Location: [facility];  Service: Orthopedics;  Laterality: Right;  REMOVING  STRYKER  GAMMA NAIL  . HARDWARE REMOVAL Right 07/03/2013   Procedure: HARDWARE REMOVAL RIGHT TIBIA ;  Surgeon: Rozanna Box, MD;  Location: [place];  Service: Orthopedics;  Laterality: Right;  . HIP CLOSED REDUCTION Right 10/15/2013   Procedure: CLOSED MANIPULATION HIP;  Surgeon: [name], MD;  Location: [facility];  Service: Orthopedics;  Laterality: Right;  . hip revision     June 2017  . HOLMIUM LASER APPLICATION Right 02/11/2705   Procedure: HOLMIUM LASER APPLICATION;  Surgeon: Cleon Gustin, MD;  Location: [facility];  Service: Urology;  Laterality: Right;  . INCISION AND DRAINAGE HIP Right 11/16/2013   Procedure: IRRIGATION AND DEBRIDEMENT RIGHT HIP;  Surgeon: [name], MD;  Location:  [facility];  Service: Orthopedics;  Laterality: Right;  . IR IMAGING GUIDED PORT INSERTION  06/22/2018  . IR URETERAL STENT LEFT NEW ACCESS W/O SEP NEPHROSTOMY CATH  10/24/2017  . JOINT REPLACEMENT     hip-right x2  . KNEE SURGERY Bilateral 2012   Total knee replacements  . NEPHROLITHOTOMY Right 02/08/2017   Procedure: NEPHROLITHOTOMY PERCUTANEOUS WITH SURGEON ACCESS;  Surgeon: Cleon Gustin, MD;  [facility]: [facility];  Service: Urology;  Laterality: Right;  . NEPHROLITHOTOMY Left 10/24/2017   Procedure: NEPHROLITHOTOMY PERCUTANEOUS;  Surgeon: Cleon Gustin, MD;  [facility];  Service: Urology;  Laterality: Left;  . ORIF TIBIA FRACTURE Right 02/06/2013   Procedure: OPEN REDUCTION INTERNAL FIXATION (ORIF) TIBIA FRACTURE WITH IM ROD FIBULA;  Surgeon: Rozanna Box, MD;  Location: [place];  Service: Orthopedics;  Laterality: Right;  . ORIF TIBIA FRACTURE Right 07/03/2013   Procedure: RIGHT TIBIA NON UNION REPAIR ;  Surgeon: Rozanna Box, MD;  Location: [place];  Service: Orthopedics;  Laterality: Right;  . ORIF WRIST FRACTURE  10/02/2012   Procedure: OPEN REDUCTION INTERNAL FIXATION (ORIF) WRIST FRACTURE;  Surgeon: Roseanne Kaufman, MD;  [facility];  Service: Orthopedics;  Laterality: Right;  WITH   ANTIBIOTIC  CEMENT  . ORIF WRIST FRACTURE Left 10/28/2013   Procedure: OPEN REDUCTION INTERNAL FIXATION (ORIF) WRIST FRACTURE with allograft;  Surgeon: Roseanne Kaufman, MD;  Location: [facility];  Service: Orthopedics;  Laterality: Left;  DVR Plate  . QUADRICEPS TENDON REPAIR Left 07/15/2017   Procedure: REPAIR QUADRICEP TENDON;  Surgeon: Frederik Pear, MD;  Location: [place];  Service: Orthopedics;  Laterality: Left;  . right femur surgery  05/2012  . THYROIDECTOMY  02/1986  . TOTAL HIP REVISION  10/05/2012   Procedure: TOTAL HIP REVISION;  Surgeon: [name], MD;  Location: [facility];  Service: Orthopedics;  Laterality: Right;  RIGHT TOTAL HIP REVISION  . TOTAL HIP REVISION Right 09/17/2013    Procedure: REVISION RIGHT TOTAL HIP ARTHROPLASTY ;  Surgeon: [name], MD;  [facility]: [facility];  Service: Orthopedics;  Laterality: Right;  . TOTAL HIP REVISION Right 10/26/2013   Procedure: REVISION RIGHT TOTAL HIP ARTHROPLASTY;  Surgeon: [name], MD;  Location: [facility];  Service: Orthopedics;  Laterality: Right;  . TOTAL HIP REVISION  03/2016  . TOTAL KNEE REVISION Left 04/11/2017   Procedure: TOTAL KNEE REVISION PATELLA and TIBIA;  Surgeon: Frederik Pear, MD;  Location: [facility];  Service: Orthopedics;  Laterality: Left;  . WRIST FRACTURE SURGERY  05/2012    SOCIAL HISTORY: Social History   Socioeconomic History  . Marital status: Married    Spouse name: Not on file  . Number of children: Not on file  . Years of education: Not on file  . Highest education level: Not on file  Occupational History  . Not on file  Social Needs  . Financial resource strain: Not on file  . Food insecurity:    Worry: Not on file    Inability: Not on file  . Transportation needs:    Medical: Not on file    Non-medical: Not on file  Tobacco Use  . Smoking status: Former Smoker    Last attempt to quit: 06/05/2012    Years since quitting: 6.2  . Smokeless tobacco: Never Used  Substance and Sexual Activity  . Alcohol use: Yes    Comment: occasional-social  . Drug use: No  . Sexual activity: Yes  Lifestyle  . Physical activity:    Days per week: Not on file    Minutes per session: Not on file  . Stress: Not on file  Relationships  . Social connections:    Talks on phone: Not on file    Gets together: Not on file    Attends religious service: Not on file    Active member of club or organization: Not on file    Attends meetings of clubs or organizations: Not on file    Relationship status: Not on file  . Intimate partner violence:    Fear of current or ex partner: Not on file    Emotionally abused: Not on file    Physically abused: Not on file    Forced sexual activity: Not on file    Other Topics Concern  . Not on file  Social History Narrative   [place] 2.5 months, went home Feb 21st slipped and fell on back and developed.  Home PT/OT.  Just started outpatient physical therapy.  Friday night, misstepped.      FAMILY HISTORY: Family History  Problem Relation Age of Onset  . CAD Father 45  . Asthma Father   . Alcohol abuse Father   . Arthritis Mother   . Alcohol abuse Sister     ALLERGIES:  is allergic to short ragweed pollen ext.  MEDICATIONS:  Current Outpatient Medications  Medication Sig Dispense Refill  . alfuzosin (UROXATRAL) 10 MG 24 hr tablet Take 10 mg by mouth at bedtime.    [name] amoxicillin (AMOXIL) 500 MG capsule Take 2,000 mg by mouth See admin instructions. Takes 4 capsules 1 hour prior to dental procedures    . atorvastatin (LIPITOR) 80 MG tablet Take 80 mg by mouth daily.    [name] buPROPion (WELLBUTRIN XL) 300 MG 24 hr tablet TAKE 1 TABLET DAILY WITH  BREAKFAST BY MOUTH. (Patient taking differently: Take 300 mg by mouth daily. ) 90 tablet 3  . Calcium Citrate-Vitamin D (CALCIUM CITRATE + D PO) Take 1 tablet by mouth 2 (two) times daily.     . Cholecalciferol (VITAMIN D-3 PO) Take 2,000 Units by mouth 2 (two) times daily.     . clonazePAM (KLONOPIN) 1 MG tablet Take 1 tablet (1 mg total) by mouth every 6 (six) hours as needed for anxiety. TAKE 1 TABLET BY MOUTH  EVERY 6 HOURS prn 360 tablet 1  . denosumab (PROLIA) 60 MG/ML SOLN injection Inject 60 mg into the skin every 6 (  six) months. Administer in upper arm, thigh, or abdomen    . diltiazem (CARDIZEM CD) 240 MG 24 hr capsule TAKE 1 CAPSULE BY MOUTH  DAILY 90 capsule 3  . doxycycline (VIBRA-TABS) 100 MG tablet TAKE 1 TABLET BY MOUTH  DAILY 90 tablet 1  . DULoxetine (CYMBALTA) 60 MG capsule TAKE 1 CAPSULE BY MOUTH  TWICE DAILY 180 capsule 1  . EPINEPHrine (EPIPEN 2-PAK) 0.3 mg/0.3 mL IJ SOAJ injection Inject 0.3 mg into the muscle daily as needed (for anaphylactic reaction).    [name] etanercept (ENBREL) 50  MG/ML injection Inject 50 mg into the skin 2 (two) times a week. Tuesday & Friday    . ezetimibe (ZETIA) 10 MG tablet Take 1 tablet (10 mg total) by mouth daily. 90 tablet 3  . ferrous sulfate 325 (65 FE) MG tablet Take 325 mg by mouth 2 (two) times daily with a meal.    . HYDROcodone-acetaminophen (NORCO) 7.5-325 MG tablet Take 1 tablet by mouth 4 (four) times daily as needed for moderate pain. Please fill on 08/02/18 120 tablet 0  . ipratropium (ATROVENT) 0.03 % nasal spray Place 1 spray into the nose daily as needed.    [name] levothyroxine (SYNTHROID, LEVOTHROID) 200 MCG tablet TAKE 1 TABLET DAILY AT 6 AM BY MOUTH. (Patient taking differently: Take 225 mcg by mouth daily before breakfast. ) 90 tablet 3  . levothyroxine (SYNTHROID, LEVOTHROID) 25 MCG tablet TAKE 1 TABLET BY MOUTH  DAILY BEFORE BREAKFAST. (Patient taking differently: Take 225 mcg by mouth daily before breakfast. ) 90 tablet 3  . lidocaine-prilocaine (EMLA) cream Apply to affected area once 30 g 3  . loratadine (CLARITIN) 10 MG tablet Take 10 mg by mouth daily.     [name] LORazepam (ATIVAN) 0.5 MG tablet Take 1 tablet (0.5 mg total) by mouth every 6 (six) hours as needed (Nausea or vomiting). 30 tablet 0  . metoprolol tartrate (LOPRESSOR) 25 MG tablet Take 1 tablet (25 mg total) by mouth 2 (two) times daily. 60 tablet 3  . mirabegron ER (MYRBETRIQ) 25 MG TB24 tablet Take 25 mg by mouth at bedtime.    . Multiple Vitamin (MULTIVITAMIN WITH MINERALS) TABS tablet Take 1 tablet by mouth daily. Men's One-A-Day 31+    . nitroGLYCERIN (NITROSTAT) 0.4 MG SL tablet Place 0.4 mg under the tongue every 5 (five) minutes as needed for chest pain. x3 doses as needed for chest pain    . omeprazole (PRILOSEC) 40 MG capsule TAKE 1 CAPSULE DAILY BEFORE BREAKFAST BY MOUTH. (Patient taking differently: Take 40 mg by mouth daily. ) 90 capsule 3  . ondansetron (ZOFRAN) 8 MG tablet Take 1 tablet (8 mg total) by mouth 2 (two) times daily as needed for refractory  nausea / vomiting. chemotherapy. 30 tablet 1  . predniSONE (DELTASONE) 20 MG tablet Take 3 tablets (60 mg total) by mouth daily. Take on days 1-5 of chemotherapy. 50 tablet 0  . PROAIR HFA 108 (90 Base) MCG/ACT inhaler Inhale 1 puff into the lungs every 4 (four) hours as needed.    . Probiotic Product (PROBIOTIC PO) Take 1 capsule by mouth daily with breakfast.    . prochlorperazine (COMPAZINE) 10 MG tablet Take 1 tablet (10 mg total) by mouth every 6 (six) hours as needed (Nausea or vomiting). 30 tablet 6  . testosterone enanthate (DELATESTRYL) 200 MG/ML injection INJECT 1ML (200MG  TOTAL) INTO THE MUSCLE EVERY 14 DAYS. FOR INTRAMUSCULAR USE ONLY. DISCARD VIAL AFTER 28 DAYS. 5 mL 4  . tiZANidine (  ZANAFLEX) 4 MG tablet TAKE 1 TABLET BY MOUTH  EVERY 6 HOURS AS NEEDED FOR MUSCLE SPASM(S) (Patient taking differently: Take 4 mg by mouth every 6 (six) hours as needed for muscle spasms. ) 360 tablet 1  . triamcinolone ointment (KENALOG) 0.5 % APPLY TO AFFECTED AREA TWICE A DAY. 30 g 0  . vitamin C (ASCORBIC ACID) 500 MG tablet Take 500 mg by mouth daily.    Alveda Reasons 10 MG TABS tablet TAKE 1 TABLET DAILY BY  MOUTH. 90 tablet 3   Current Facility-Administered Medications  Medication Dose Route Frequency Provider Last Rate Last Dose  . magnesium sulfate 2,000 mg in dextrose 5 % 50 mL IVPB  2,000 mg Intravenous Once Brunetta Genera, MD        REVIEW OF SYSTEMS:    A 10+ POINT REVIEW OF SYSTEMS WAS OBTAINED including neurology, dermatology, psychiatry, cardiac, respiratory, lymph, extremities, GI, GU, Musculoskeletal, constitutional, breasts, reproductive, HEENT.  All pertinent positives are noted in the HPI.  All others are negative.   PHYSICAL EXAMINATION: ECOG PERFORMANCE STATUS: 2 - Symptomatic, <50% confined to bed  . Vitals:   08/29/18 0856  BP: 120/67  Pulse: 75  Resp: (!) 22  Temp: (!) 97.5 F (36.4 C)  SpO2: 96%   Filed Weights   08/29/18 0856  Weight: 298 lb 3.2 oz (135.3 kg)     .Body mass index is 37.27 kg/m.  GENERAL:alert, in no acute distress and comfortable SKIN: chronic venous stasis changes in BLE, psoriasis rashes on elbows EYES: conjunctiva are pink and non-injected, sclera anicteric OROPHARYNX: MMM, no exudates, no oropharyngeal erythema or ulceration NECK: supple, no JVD LYMPH:  Couple palpable enlarged cervical lymph nodes b/l, no palpable lymphadenopathy in the axillary or inguinal regions LUNGS: clear to auscultation b/l with normal respiratory effort HEART: regular rate & rhythm ABDOMEN:  normoactive bowel sounds , non tender, not distended. No palpable hepatosplenomegaly.  Extremity: no pedal edema PSYCH: alert & oriented x 3 with fluent speech NEURO: no focal motor/sensory deficits   LABORATORY DATA:  I have reviewed the data as listed  . CBC Latest Ref Rng & Units 08/29/2018 08/04/2018 07/13/2018  WBC 4.0 - 10.5 K/uL 6.6 6.9 7.0  Hemoglobin 13.0 - 17.0 g/dL 13.5 14.5 15.9  Hematocrit 39.0 - 52.0 % 41.1 43.6 47.6  Platelets 150 - 400 K/uL 181 160 190    . CMP Latest Ref Rng & Units 08/29/2018 08/04/2018 07/13/2018  Glucose 70 - 99 mg/dL 132(H) 123(H) 119(H)  BUN 8 - 23 mg/dL 26(H) 31(H) 28(H)  Creatinine 0.61 - 1.24 mg/dL 1.41(H) 1.18 1.21  Sodium 135 - 145 mmol/L 140 141 138  Potassium 3.5 - 5.1 mmol/L 4.7 4.6 5.4(H)  Chloride 98 - 111 mmol/L 104 106 103  CO2 22 - 32 mmol/L 25 26 30   Calcium 8.9 - 10.3 mg/dL 9.5 9.4 9.0  Total Protein 6.5 - 8.1 g/dL 6.5 6.6 7.3  Total Bilirubin 0.3 - 1.2 mg/dL 0.6 0.5 0.5  Alkaline Phos 38 - 126 U/L 61 67 72  AST 15 - 41 U/L 38 26 21  ALT 0 - 44 U/L 50(H) 41 32   05/29/18 Left tonsil biopsy:   RADIOGRAPHIC STUDIES: I have personally reviewed the radiological images as listed and agreed with the findings in the report. Nm Pet Image Restag (ps) Skull Base To Thigh  Result Date: 08/22/2018 CLINICAL DATA:  Subsequent treatment strategy for diffuse large B-cell lymphoma. EXAM: NUCLEAR MEDICINE  PET SKULL BASE TO THIGH  TECHNIQUE: 15.1 mCi F-18 FDG was injected intravenously. Full-ring PET imaging was performed from the skull base to thigh after the radiotracer. CT data was obtained and used for attenuation correction and anatomic localization. Fasting blood glucose: 93 mg/dl COMPARISON:  06/16/2018 FINDINGS: Mediastinal blood pool activity: SUV max 2.51 Liver activity: SUV max 3.96. NECK: The left palatine tonsillar mass has decreased in size in the interval. SUV max is equal to 3.91. Previously 19.8. Decreased FDG uptake associated with the right tonsillar region with SUV max of 3.58. Previously 9.8. Resolution of previous hypermetabolic left level 2 lymph nodes. Previously noted large lymph node superficial to the right sternocleidomastoid muscle 2.2 cm short axis and has an SUV max of 2.4. Previously this measured 2.3 cm within SUV max of 2.9. Incidental CT findings: none CHEST: No hypermetabolic axillary, supraclavicular, mediastinal, or hilar lymph nodes. No pleural effusions.  No hypermetabolic pulmonary nodules. Incidental CT findings: Aortic atherosclerosis. Calcifications in the LAD, left circumflex and RCA coronary artery noted. ABDOMEN/PELVIS: There is no abnormal radiotracer uptake identified within the liver, pancreas, spleen, or adrenal glands. No hypermetabolic abdominal lymph nodes. Index right inguinal lymph node has resolved in the interval. The previous index left inguinal lymph node has also resolved in the interval. Incidental CT findings: Aortic atherosclerosis. No aneurysm. Bilateral nonobstructing renal calculi. Fat containing umbilical hernia noted. SKELETON: Again noted is a right hip arthroplasty device with cerclage wires and exuberant bone along the proximal stem. Increased radiotracer uptake within this area is again noted within SUV max of 12.25. Incidental CT findings: Vertebra plana deformity involving L1. Chronic right rib fractures. IMPRESSION: 1. Interval response to  therapy. Overall Deauville criteria 3. Interval decrease in size and FDG uptake associated with palatine tonsil lesions. There has been resolution of previous left level-II and bilateral inguinal lymph nodes. 2. Enlarged subcutaneous lesion superficial to the lower right sternocleidomastoid muscle exhibits decreased FDG uptake. Currently Deauville criteria 2. 3. No new or progressive sites of disease identified. 4.  Aortic Atherosclerosis (ICD10-I70.0). 5. Multi vessel coronary artery atherosclerotic calcifications. 6. Unchanged vertebra plana deformity. 7. Kidney stones. Electronically Signed   By: Kerby Moors M.D.   On: 08/22/2018 10:14    ASSESSMENT & PLAN:  72 y.o. male with  1. Diffuse Large B-Cell Lymphoma,- Stage III Left tonsil -05/29/18 Left tonsil biopsy which revealed Diffuse Large B-Cell Lymphoma, which requires treatment  -06/07/18 LDH at 214, will monitor change through treatment.  -Baseline ECHO 06/14/18: EF 50-55% -Standard regimen of R-CHOP with G-CSF support for 6 cycles starting 06/23/18  06/16/18 PET/CT revealed 1. Deauville 5 hypermetabolic lesions in the palatine tonsils, left internal jugular chain, and involving a right inguinal lymph node. Deauville 4 involvement of a left inguinal lymph node and an enlarged subcutaneous lesion favoring lymph node superficial to the lower right sternocleidomastoid muscle in the neck. 2. Other imaging findings of potential clinical significance: Aortic Atherosclerosis. Coronary atherosclerosis. Emphysema. Bilateral nonobstructive nephrolithiasis. Umbilical hernia contains adipose tissues. Old right rib fractures some of which are nonunited. 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   PLAN:  -Recommended salt and baking soda mouthwashes 3-4 times each day -Will continue to monitor mild neuropathy in fingertips -Recommend Claritin and Tylenol for mild bone pains related to neulasta, pt will let me know if he develops worsening bone pains  -Pt holding Enbrel  and testosterone since last visit. I previously prescribed Kenalog for topical use for now. If needed he may restart Testosterone at low dose. Will monitor.  -Discussed pt labwork today,  08/28/18; blood counts are stable, Magnesium a little low at 1.6, Creatinine a little higher at 1.41 -Will replace magnesium today IV with treatment  -Discussed the 08/22/18 PET/CT which revealed Interval response to therapy. Overall Deauville criteria 3. Interval decrease in size and FDG uptake associated with palatine tonsil lesions. There has been resolution of previous left level-II and bilateral inguinal lymph nodes. 2. Enlarged subcutaneous lesion superficial to the lower right sternocleidomastoid muscle exhibits decreased FDG uptake. Currently Deauville criteria 2. 3. No new or progressive sites of disease identified. 4.  Aortic Atherosclerosis. 5. Multi vessel coronary artery atherosclerotic calcifications. 6. Unchanged vertebra plana deformity. 7. Kidney stones. -The pt has no prohibitive toxicities from continuing C4 R-CHOP with G-CSF support at this time.   -Recommended that the pt continue to eat well, drink at least 48-64 oz of water each day, and walk 20-30 minutes each day.   2.  Patient Active Problem List   Diagnosis Date Noted  . Counseling regarding advance care planning and goals of care 07/09/2018  . Diffuse large B-cell lymphoma ([place]) 06/23/2018  . Port-A-Cath in place 06/23/2018  . Hypogonadism in male 11/22/2017  . Nephrolithiasis 10/24/2017  . Recurrent dislocation of left patella 07/15/2017  . Recurrent subluxation of patella, left 07/08/2017  . S/P revision of total knee 04/11/2017  . Failed total knee, left, initial encounter ([place]) 04/09/2017  . Renal calculus, right 02/08/2017  . Protein-calorie malnutrition, severe ([place]) 12/08/2016  . Chronic obstructive pulmonary disease ([place]) 12/08/2016  . Staghorn renal calculus 11/05/2016  . Wound drainage right hip 11/15/2013  . Expected  blood loss anemia 09/18/2013  . Hyponatremia 09/18/2013  . S/P right TH revision 09/17/2013  . Nonunion, fracture, Right tibia  07/03/2013  . UTI (urinary tract infection) 07/03/2013  . Pathological fracture due to osteoporosis with delayed healing 04/24/2013  . Osteoporosis with fracture 04/24/2013  . Rash and nonspecific skin eruption 03/08/2013  . Syncope 02/21/2013  . PAF (paroxysmal atrial fibrillation) ([place]) 02/21/2013  . Chronic anticoagulation 02/21/2013  . Fall 02/08/2013  . Physical deconditioning 02/08/2013  . Low back pain radiating to both legs 02/08/2013  . Compression fracture of L1 lumbar vertebra (HCC) 02/08/2013  . Right tibial fracture 02/05/2013  . Obesity, unspecified 02/05/2013  . Osteomyelitis of right wrist ([place]) 11/02/2012  . Anemia 10/06/2012  . HTN (hypertension) 10/03/2012  . Psoriasis 10/03/2012  . Dyslipidemia 10/03/2012  . Vitamin D deficiency 10/03/2012  . GERD (gastroesophageal reflux disease) 10/03/2012  . Hyperglycemia 10/03/2012  . Anxiety 10/03/2012  . Depression 10/03/2012  . CAD (coronary artery disease) 10/03/2012  . DJD (degenerative joint disease) 10/03/2012  . Chronic gingivitis 10/03/2012  . Low testosterone, possible hypogonadism 10/01/2012  . Normocytic anemia 09/30/2012  . Right femoral fracture ([place]) 09/29/2012  . Right forearm fracture 09/29/2012  . Atrial fibrillation ([place]) 09/29/2012  . Hypothyroidism 09/29/2012  . History of recurrent UTIs 09/29/2012   -continue mx of medical co-morbidities with PCP   Please schedule C5 and C6 of R-CHOP with neulasta RTC with Dr Irene Limbo with labs in 3 weeks and 6 weeks with each cycle of chemotherapy   All of the patients questions were answered with apparent satisfaction. The patient knows to call the clinic with any problems, questions or concerns.  The total time spent in the appt was 30 minutes and more than 50% was on counseling and direct patient cares.    Sullivan Lone MD [place]  AAHIVMS [facility] Hematology/Oncology Physician [place]  (Office):  [phone] (Work cell):  [phone] (Fax):           [phone]  08/29/2018 9:48 AM  I, Baldwin [place], am acting as a scribe for Dr. Sullivan Lone.   .I have reviewed the above documentation for accuracy and completeness, and I agree with the above. Brunetta Genera MD

## 2018-08-28 NOTE — Therapy (Signed)
[place], [place], [address] Phone: [phone]   Fax:  [phone]  Physical Therapy Treatment  Patient Details  Name: [name] MRN: [id number] Date of Birth: 22-Dec-1945 Referring Provider (PT): Dr. Irene Limbo   Encounter Date: 08/28/2018  PT End of Session - 08/28/18 1511    Visit Number  5    Number of Visits  12    Date for PT Re-Evaluation  09/21/18    PT Start Time  5329    PT Stop Time  1515    PT Time Calculation (min)  42 min    Activity Tolerance  Patient tolerated treatment well    Behavior During Therapy  [facility] for tasks assessed/performed       Past Medical History:  Diagnosis Date  . A-fib (HCC)    hx.- sinus rhythm now  . Acid reflux   . AKI (acute kidney injury) (HCC)    mild  . Anxiety   . Arthritis   . Atrial dilatation, bilateral    Severly, enlargement  per ECHO 11/03/16  . BPH (benign prostatic hyperplasia)   . CAD (coronary artery disease)   . Depression   . Dislocation of internal right hip prosthesis, sequela   . Dysrhythmia    hx a fib  . Elevated brain natriuretic peptide (BNP) level   . Environmental allergies   . Fracture of wrist 10/23/13   LEFT  . High cholesterol   . History of Graves' disease   . History of kidney stones    surg. removal 02/2017  . History of syncope   . HLD (hyperlipidemia)   . Hypertension   . Hypothyroidism    thyroid removed  . Left knee injury    cap dislocated  . Low testosterone, possible hypogonadism 10/01/2012  . Lumbago   . Metabolic bone disease   . Multiple falls   . Osteoporosis    Severe  . Plaque psoriasis   . Sleep apnea    cpap machine-settings 17  . Thrombocytopenia ([place])   . Thyroid disease    HX GRAVES DISEASE  . Transfusion history    s/p 12'13 hip surgery  . Unsteady gait   . UTI (urinary tract infection)   . Vertebral compression fracture (HCC)    L1-wears brace    Past Surgical History:   Procedure Laterality Date  . APPENDECTOMY    . CARDIAC CATHETERIZATION  2013  . CATARACT EXTRACTION, BILATERAL    . COLONOSCOPY    . CYSTOSCOPY WITH RETROGRADE PYELOGRAM, URETEROSCOPY AND STENT PLACEMENT Left 11/07/2017   Procedure: CYSTOSCOPY WITH RETROGRADE PYELOGRAM, URETEROSCOPY AND STENT PLACEMENT;  Surgeon: Cleon Gustin, MD;  [facility];  Service: Urology;  Laterality: Left;  . CYSTOSCOPY/URETEROSCOPY/HOLMIUM LASER/STENT PLACEMENT Right 03/11/2017   Procedure: CYSTOSCOPY/URETEROSCOPYSTENT PLACEMENT right ureter retrograde pylegram;  Surgeon: Cleon Gustin, MD;  [facility];  Service: Urology;  Laterality: Right;  . HARDWARE REMOVAL  10/05/2012   Procedure: HARDWARE REMOVAL;  Surgeon: [name], MD;  [facility];  Service: Orthopedics;  Laterality: Right;  REMOVING  STRYKER  GAMMA NAIL  . HARDWARE REMOVAL Right 07/03/2013   Procedure: HARDWARE REMOVAL RIGHT TIBIA ;  Surgeon: Rozanna Box, MD;  Location: [facility];  Service: Orthopedics;  Laterality: Right;  . HIP CLOSED REDUCTION Right 10/15/2013   Procedure: CLOSED MANIPULATION HIP;  Surgeon: [name], MD;  [facility];  Service: Orthopedics;  Laterality: Right;  . hip revision  June 2017  . HOLMIUM LASER APPLICATION Right 11/17/8339   Procedure: HOLMIUM LASER APPLICATION;  Surgeon: Cleon Gustin, MD;  [facility];  Service: Urology;  Laterality: Right;  . INCISION AND DRAINAGE HIP Right 11/16/2013   Procedure: IRRIGATION AND DEBRIDEMENT RIGHT HIP;  Surgeon: [name], MD;  [facility];  Service: Orthopedics;  Laterality: Right;  . IR IMAGING GUIDED PORT INSERTION  06/22/2018  . IR URETERAL STENT LEFT NEW ACCESS W/O SEP NEPHROSTOMY CATH  10/24/2017  . JOINT REPLACEMENT     hip-right x2  . KNEE SURGERY Bilateral 2012   Total knee replacements  . NEPHROLITHOTOMY Right 02/08/2017   Procedure: NEPHROLITHOTOMY PERCUTANEOUS WITH SURGEON ACCESS;  Surgeon: Cleon Gustin, MD;   [facility];  Service: Urology;  Laterality: Right;  . NEPHROLITHOTOMY Left 10/24/2017   Procedure: NEPHROLITHOTOMY PERCUTANEOUS;  Surgeon: Cleon Gustin, MD;  [facility];  Service: Urology;  Laterality: Left;  . ORIF TIBIA FRACTURE Right 02/06/2013   Procedure: OPEN REDUCTION INTERNAL FIXATION (ORIF) TIBIA FRACTURE WITH IM ROD FIBULA;  Surgeon: Rozanna Box, MD;  Location: [place];  Service: Orthopedics;  Laterality: Right;  . ORIF TIBIA FRACTURE Right 07/03/2013   Procedure: RIGHT TIBIA NON UNION REPAIR ;  Surgeon: Rozanna Box, MD;  Location: [facility];  Service: Orthopedics;  Laterality: Right;  . ORIF WRIST FRACTURE  10/02/2012   Procedure: OPEN REDUCTION INTERNAL FIXATION (ORIF) WRIST FRACTURE;  Surgeon: Roseanne Kaufman, MD;  [facility];  Service: Orthopedics;  Laterality: Right;  WITH   ANTIBIOTIC  CEMENT  . ORIF WRIST FRACTURE Left 10/28/2013   Procedure: OPEN REDUCTION INTERNAL FIXATION (ORIF) WRIST FRACTURE with allograft;  Surgeon: Roseanne Kaufman, MD;  [facility];  Service: Orthopedics;  Laterality: Left;  DVR Plate  . QUADRICEPS TENDON REPAIR Left 07/15/2017   Procedure: REPAIR QUADRICEP TENDON;  Surgeon: Frederik Pear, MD;  Location: [facility];  Service: Orthopedics;  Laterality: Left;  . right femur surgery  05/2012  . THYROIDECTOMY  02/1986  . TOTAL HIP REVISION  10/05/2012   Procedure: TOTAL HIP REVISION;  Surgeon: [name], MD;  [facility];  Service: Orthopedics;  Laterality: Right;  RIGHT TOTAL HIP REVISION  . TOTAL HIP REVISION Right 09/17/2013   Procedure: REVISION RIGHT TOTAL HIP ARTHROPLASTY ;  Surgeon: [name], MD;  [facility];  Service: Orthopedics;  Laterality: Right;  . TOTAL HIP REVISION Right 10/26/2013   Procedure: REVISION RIGHT TOTAL HIP ARTHROPLASTY;  Surgeon: [name], MD;  [facility];  Service: Orthopedics;  Laterality: Right;  . TOTAL HIP REVISION  03/2016  . TOTAL KNEE REVISION Left 04/11/2017   Procedure:  TOTAL KNEE REVISION PATELLA and TIBIA;  Surgeon: Frederik Pear, MD;  Location: [facility];  Service: Orthopedics;  Laterality: Left;  . WRIST FRACTURE SURGERY  05/2012    There were no vitals filed for this visit.  Subjective Assessment - 08/28/18 1436    Subjective  I am just getting scared about how weak and shaky I am.  I had a hard time taking care of my husband at the large [place].  I will have to be taking care of him.  I was able to walk almost 3 blocks into the theater but it was so hard.      Pertinent History  2013 fall resulting in a femur break on the Rt leg resulting in multiple surgeries due to failures including hip replacements another broken tibia  leading to the discovery of osteoporosis and a metabolic bone disease.  After forteo injections x 2 years he is now in the osteopenia category.  Has a new scan coming up in a few weeks.  prior to the cancer diagnosis he was able to be confident with a cane.  Then was diagnosed with diffuse non-hogkin large cell B-lymphoma 07/09/18 located in tonsils and Rt groin. Treatment consists of an infusion every 3 weeks hopefully for a total of 5, A-fib controlled, HTN controlled with medication    Patient Stated Goals  get my balance back    Currently in Pain?  No/denies                       [facility] Adult PT Treatment/Exercise - 08/28/18 0001      Exercises   Exercises  Knee/Hip      Knee/Hip Exercises: Seated   Long Arc Quad  10 reps;Both    Long Arc Quad Weight  2 lbs.    Marching Limitations  x 10 bil     Marching Weights  2 lbs.    Hamstring Curl  Both;10 reps    Hamstring Limitations  red band       Ankle Exercises: Aerobic   Nustep  level 2 x 68min with walk from clinic to nustep x 2 with only 1 rest break needed (13min total)                  PT Long Term Goals - 08/10/18 1503      PT LONG TERM GOAL #1   Title  Pt will improve sit to stand x 30sec from 21" surface to 8 times     Time  6    Period   Weeks    Status  New    Target Date  09/21/18      PT LONG TERM GOAL #2   Title  Pt will improve [place] balance scale to at least 10/40    Time  6    Period  Weeks    Target Date  09/21/18      PT LONG TERM GOAL #3   Title  Pt will be ind with LRAD for home and community use    Time  6    Period  Weeks    Status  New    Target Date  09/21/18      PT LONG TERM GOAL #4   Title  Pt will perform TUG with LRAD at 28sec or less     Time  6    Period  Weeks    Status  New    Target Date  09/21/18      PT LONG TERM GOAL #5   Title  Pt will report no instances of Rt LE giving out x 5 days at home     Time  6    Period  Weeks    Status  New    Target Date  09/21/18            Plan - 08/28/18 1511    Clinical Impression Statement  Pt overcame anxiety about walking to the nustep and was able to walk there today with only one rest break.  Stopped him at 36min but most likely could have done more.  Pt very depressed about situation but feeling better after talking about it     PT Frequency  2x / week    PT Duration  6 weeks    PT Treatment/Interventions  ADLs/Self Care Home Management;Therapeutic exercise;Patient/family education;Gait training;Stair training;Balance training;Neuromuscular re-education    PT Next Visit Plan  work on left leg balance as well as strengthening on right leg, core work. Continue gait tolerance/endurance with RW, work on turns, continue LE strength working towards Teacher, music and Agree with Plan of Care  Patient       Patient will benefit from skilled therapeutic intervention in order to improve the following deficits and impairments:     Visit Diagnosis: Other abnormalities of gait and mobility  History of falling  Muscle weakness (generalized)  Diffuse large B-cell lymphoma, unspecified body region [facility])     Problem List Patient Active Problem List   Diagnosis Date Noted  . Counseling regarding advance care planning and goals  of care 07/09/2018  . Diffuse large B-cell lymphoma ([place]) 06/23/2018  . Port-A-Cath in place 06/23/2018  . Hypogonadism in male 11/22/2017  . Nephrolithiasis 10/24/2017  . Recurrent dislocation of left patella 07/15/2017  . Recurrent subluxation of patella, left 07/08/2017  . S/P revision of total knee 04/11/2017  . Failed total knee, left, initial encounter ([place]) 04/09/2017  . Renal calculus, right 02/08/2017  . Protein-calorie malnutrition, severe ([place]) 12/08/2016  . Chronic obstructive pulmonary disease ([place]) 12/08/2016  . Staghorn renal calculus 11/05/2016  . Wound drainage right hip 11/15/2013  . Expected blood loss anemia 09/18/2013  . Hyponatremia 09/18/2013  . S/P right TH revision 09/17/2013  . Nonunion, fracture, Right tibia  07/03/2013  . UTI (urinary tract infection) 07/03/2013  . Pathological fracture due to osteoporosis with delayed healing 04/24/2013  . Osteoporosis with fracture 04/24/2013  . Rash and nonspecific skin eruption 03/08/2013  . Syncope 02/21/2013  . PAF (paroxysmal atrial fibrillation) ([place]) 02/21/2013  . Chronic anticoagulation 02/21/2013  . Fall 02/08/2013  . Physical deconditioning 02/08/2013  . Low back pain radiating to both legs 02/08/2013  . Compression fracture of L1 lumbar vertebra (HCC) 02/08/2013  . Right tibial fracture 02/05/2013  . Obesity, unspecified 02/05/2013  . Osteomyelitis of right wrist ([place]) 11/02/2012  . Anemia 10/06/2012  . HTN (hypertension) 10/03/2012  . Psoriasis 10/03/2012  . Dyslipidemia 10/03/2012  . Vitamin D deficiency 10/03/2012  . GERD (gastroesophageal reflux disease) 10/03/2012  . Hyperglycemia 10/03/2012  . Anxiety 10/03/2012  . Depression 10/03/2012  . CAD (coronary artery disease) 10/03/2012  . DJD (degenerative joint disease) 10/03/2012  . Chronic gingivitis 10/03/2012  . Low testosterone, possible hypogonadism 10/01/2012  . Normocytic anemia 09/30/2012  . Right femoral fracture ([place]) 09/29/2012  .  Right forearm fracture 09/29/2012  . Atrial fibrillation ([place]) 09/29/2012  . Hypothyroidism 09/29/2012  . History of recurrent UTIs 09/29/2012    Shan Levans, PT 08/28/2018, 3:13 PM  [place], [place], [address] Phone: [phone]   Fax:  [phone]  Name: ALVERTO SHEDD MRN: [id number] Date of Birth: 1946/08/20

## 2018-08-29 ENCOUNTER — Encounter: Payer: Self-pay | Admitting: Hematology

## 2018-08-29 ENCOUNTER — Other Ambulatory Visit: Payer: Self-pay | Admitting: Internal Medicine

## 2018-08-29 ENCOUNTER — Inpatient Hospital Stay: Payer: Medicare Other

## 2018-08-29 ENCOUNTER — Inpatient Hospital Stay (HOSPITAL_BASED_OUTPATIENT_CLINIC_OR_DEPARTMENT_OTHER): Payer: Medicare Other | Admitting: Hematology

## 2018-08-29 ENCOUNTER — Inpatient Hospital Stay: Payer: Medicare Other | Attending: Hematology

## 2018-08-29 VITALS — BP 122/63 | HR 80 | Temp 98.0°F | Resp 20

## 2018-08-29 DIAGNOSIS — C8339 Diffuse large B-cell lymphoma, extranodal and solid organ sites: Secondary | ICD-10-CM | POA: Insufficient documentation

## 2018-08-29 DIAGNOSIS — C8330 Diffuse large B-cell lymphoma, unspecified site: Secondary | ICD-10-CM

## 2018-08-29 DIAGNOSIS — I4891 Unspecified atrial fibrillation: Secondary | ICD-10-CM | POA: Diagnosis not present

## 2018-08-29 DIAGNOSIS — Z7901 Long term (current) use of anticoagulants: Secondary | ICD-10-CM | POA: Insufficient documentation

## 2018-08-29 DIAGNOSIS — Z7189 Other specified counseling: Secondary | ICD-10-CM

## 2018-08-29 DIAGNOSIS — Z5112 Encounter for antineoplastic immunotherapy: Secondary | ICD-10-CM | POA: Diagnosis present

## 2018-08-29 DIAGNOSIS — Z5189 Encounter for other specified aftercare: Secondary | ICD-10-CM | POA: Diagnosis not present

## 2018-08-29 DIAGNOSIS — Z5111 Encounter for antineoplastic chemotherapy: Secondary | ICD-10-CM | POA: Insufficient documentation

## 2018-08-29 LAB — CMP (CANCER CENTER ONLY)
ALT: 50 U/L — ABNORMAL HIGH (ref 0–44)
ANION GAP: 11 (ref 5–15)
AST: 38 U/L (ref 15–41)
Albumin: 3.3 g/dL — ABNORMAL LOW (ref 3.5–5.0)
Alkaline Phosphatase: 61 U/L (ref 38–126)
BUN: 26 mg/dL — AB (ref 8–23)
CHLORIDE: 104 mmol/L (ref 98–111)
CO2: 25 mmol/L (ref 22–32)
Calcium: 9.5 mg/dL (ref 8.9–10.3)
Creatinine: 1.41 mg/dL — ABNORMAL HIGH (ref 0.61–1.24)
GFR, Est AFR Am: 56 mL/min — ABNORMAL LOW (ref >60)
GFR, Estimated: 48 mL/min — ABNORMAL LOW (ref >60)
Glucose, Bld: 132 mg/dL — ABNORMAL HIGH (ref 70–99)
POTASSIUM: 4.7 mmol/L (ref 3.5–5.1)
SODIUM: 140 mmol/L (ref 135–145)
Total Bilirubin: 0.6 mg/dL (ref 0.3–1.2)
Total Protein: 6.5 g/dL (ref 6.5–8.1)

## 2018-08-29 LAB — CBC WITH DIFFERENTIAL (CANCER CENTER ONLY)
ABS IMMATURE GRANULOCYTES: 0.1 10*3/uL — AB (ref 0.00–0.07)
Basophils Absolute: 0.1 10*3/uL (ref 0.0–0.1)
Basophils Relative: 1 %
Eosinophils Absolute: 0.3 10*3/uL (ref 0.0–0.5)
Eosinophils Relative: 4 %
HCT: 41.1 % (ref 39.0–52.0)
HEMOGLOBIN: 13.5 g/dL (ref 13.0–17.0)
Immature Granulocytes: 2 %
LYMPHS PCT: 23 %
Lymphs Abs: 1.5 10*3/uL (ref 0.7–4.0)
MCH: 30.5 pg (ref 26.0–34.0)
MCHC: 32.8 g/dL (ref 30.0–36.0)
MCV: 93 fL (ref 80.0–100.0)
MONO ABS: 0.6 10*3/uL (ref 0.1–1.0)
MONOS PCT: 10 %
NEUTROS ABS: 4 10*3/uL (ref 1.7–7.7)
Neutrophils Relative %: 60 %
PLATELETS: 181 10*3/uL (ref 150–400)
RBC: 4.42 MIL/uL (ref 4.22–5.81)
RDW: 18.2 % — ABNORMAL HIGH (ref 11.5–15.5)
WBC Count: 6.6 10*3/uL (ref 4.0–10.5)
nRBC: 0 % (ref 0.0–0.2)

## 2018-08-29 LAB — MAGNESIUM: MAGNESIUM: 1.6 mg/dL — AB (ref 1.7–2.4)

## 2018-08-29 MED ORDER — SODIUM CHLORIDE 0.9% FLUSH
10.0000 mL | INTRAVENOUS | Status: DC | PRN
  Administered 2018-08-29: 10 mL
  Filled 2018-08-29: qty 10

## 2018-08-29 MED ORDER — HEPARIN SOD (PORK) LOCK FLUSH 100 UNIT/ML IV SOLN
500.0000 [IU] | Freq: Once | INTRAVENOUS | Status: AC | PRN
  Administered 2018-08-29: 500 [IU]
  Filled 2018-08-29: qty 5

## 2018-08-29 MED ORDER — DOXORUBICIN HCL CHEMO IV INJECTION 2 MG/ML
50.0000 mg/m2 | Freq: Once | INTRAVENOUS | Status: AC
  Administered 2018-08-29: 134 mg via INTRAVENOUS
  Filled 2018-08-29: qty 67

## 2018-08-29 MED ORDER — ACETAMINOPHEN 325 MG PO TABS
ORAL_TABLET | ORAL | Status: AC
  Filled 2018-08-29: qty 2

## 2018-08-29 MED ORDER — DIPHENHYDRAMINE HCL 25 MG PO CAPS
50.0000 mg | ORAL_CAPSULE | Freq: Once | ORAL | Status: AC
  Administered 2018-08-29: 50 mg via ORAL

## 2018-08-29 MED ORDER — MAGNESIUM SULFATE 2 GM/50ML IV SOLN
2.0000 g | Freq: Once | INTRAVENOUS | Status: AC
  Administered 2018-08-29: 2 g via INTRAVENOUS
  Filled 2018-08-29: qty 50

## 2018-08-29 MED ORDER — PALONOSETRON HCL INJECTION 0.25 MG/5ML
INTRAVENOUS | Status: AC
  Filled 2018-08-29: qty 5

## 2018-08-29 MED ORDER — PALONOSETRON HCL INJECTION 0.25 MG/5ML
0.2500 mg | Freq: Once | INTRAVENOUS | Status: AC
  Administered 2018-08-29: 0.25 mg via INTRAVENOUS

## 2018-08-29 MED ORDER — DEXAMETHASONE SODIUM PHOSPHATE 10 MG/ML IJ SOLN
INTRAMUSCULAR | Status: AC
  Filled 2018-08-29: qty 1

## 2018-08-29 MED ORDER — DIPHENHYDRAMINE HCL 25 MG PO CAPS
ORAL_CAPSULE | ORAL | Status: AC
  Filled 2018-08-29: qty 2

## 2018-08-29 MED ORDER — VINCRISTINE SULFATE CHEMO INJECTION 1 MG/ML
2.0000 mg | Freq: Once | INTRAVENOUS | Status: AC
  Administered 2018-08-29: 2 mg via INTRAVENOUS
  Filled 2018-08-29: qty 2

## 2018-08-29 MED ORDER — MAGNESIUM SULFATE 50 % IJ SOLN: 2000.0000 mg | Freq: Once | INTRAVENOUS | Status: DC

## 2018-08-29 MED ORDER — DEXAMETHASONE SODIUM PHOSPHATE 10 MG/ML IJ SOLN
10.0000 mg | Freq: Once | INTRAMUSCULAR | Status: AC
  Administered 2018-08-29: 10 mg via INTRAVENOUS

## 2018-08-29 MED ORDER — SODIUM CHLORIDE 0.9 % IV SOLN
375.0000 mg/m2 | Freq: Once | INTRAVENOUS | Status: AC
  Administered 2018-08-29: 1000 mg via INTRAVENOUS
  Filled 2018-08-29: qty 100

## 2018-08-29 MED ORDER — SODIUM CHLORIDE 0.9 % IV SOLN
740.0000 mg/m2 | Freq: Once | INTRAVENOUS | Status: AC
  Administered 2018-08-29: 2000 mg via INTRAVENOUS
  Filled 2018-08-29: qty 100

## 2018-08-29 MED ORDER — SODIUM CHLORIDE 0.9 % IV SOLN
Freq: Once | INTRAVENOUS | Status: AC
  Administered 2018-08-29: 10:00:00 via INTRAVENOUS
  Filled 2018-08-29: qty 250

## 2018-08-29 MED ORDER — ACETAMINOPHEN 325 MG PO TABS
650.0000 mg | ORAL_TABLET | Freq: Once | ORAL | Status: AC
  Administered 2018-08-29: 650 mg via ORAL

## 2018-08-29 NOTE — Telephone Encounter (Signed)
Requested medication (s) are due for refill today: yes  Requested medication (s) are on the active medication list: yes  Last refill:  07/30/18 #120  Future visit scheduled: yes 10/02/18  Notes to clinic:  Unable to fill per protocol. LOV on 02/20/18    Requested Prescriptions  Pending Prescriptions Disp Refills   HYDROcodone-acetaminophen ([place]CO) 7.5-325 MG tablet 120 tablet 0    Sig: Take 1 tablet by mouth 4 (four) times daily as needed for moderate pain. Please fill on 08/02/18     Not Delegated - Analgesics:  Opioid Agonist Combinations Failed - 08/29/2018 12:04 PM      Failed - This refill cannot be delegated      Failed - Urine Drug Screen completed in last 360 days.      Failed - Valid encounter within last 6 months    Recent Outpatient Visits          6 months ago Hypogonadism in male   [place], Evie Lacks, MD   9 months ago Nephrolithiasis   [facility] Plot[facility], Evie Lacks, MD   1 year ago [place], MD      Future Appointments            In 1 month Plotnikov, Evie Lacks, MD [place], [place]

## 2018-08-29 NOTE — Telephone Encounter (Signed)
Copied from [place] [phone]. Topic: Quick Communication - Rx Refill/Question >> Aug 29, 2018  9:40 AM Keene Breath wrote: Medication: HYDROcodone-acetaminophen ([place]) 7.5-325 MG tablet  Patient called to request a refill for the above medication  Preferred Pharmacy (with phone number or street name): [place], [place] [employer] [phone] (Phone) [phone] (Fax)

## 2018-08-29 NOTE — Patient Instructions (Signed)
[facility] Discharge Instructions for Patients Receiving Chemotherapy  Today you received the following chemotherapy agents Rituxan, Cytoxan, Velcade, and Adriamycin.  To help prevent nausea and vomiting after your treatment, we encourage you to take your nausea medication as directed.  If you develop nausea and vomiting that is not controlled by your nausea medication, call the clinic.   BELOW ARE SYMPTOMS THAT SHOULD BE REPORTED IMMEDIATELY:  *FEVER GREATER THAN 100.5 F  *CHILLS WITH OR WITHOUT FEVER  NAUSEA AND VOMITING THAT IS NOT CONTROLLED WITH YOUR NAUSEA MEDICATION  *UNUSUAL SHORTNESS OF BREATH  *UNUSUAL BRUISING OR BLEEDING  TENDERNESS IN MOUTH AND THROAT WITH OR WITHOUT PRESENCE OF ULCERS  *URINARY PROBLEMS  *BOWEL PROBLEMS  UNUSUAL RASH Items with * indicate a potential emergency and should be followed up as soon as possible.  Feel free to call the clinic should you have any questions or concerns. The clinic phone number is [phone].  Please show the [facility] at check-in to the Emergency Department and triage nurse.

## 2018-08-29 NOTE — Patient Instructions (Signed)
Thank you for choosing [facility] to provide your oncology and hematology care.  To afford each patient quality time with our providers, please arrive 30 minutes before your scheduled appointment time.  If you arrive late for your appointment, you may be asked to reschedule.  We strive to give you quality time with our providers, and arriving late affects you and other patients whose appointments are after yours.    If you are a no show for multiple scheduled visits, you may be dismissed from the clinic at the providers discretion.     Again, thank you for choosing [facility], our hope is that these requests will decrease the amount of time that you wait before being seen by our physicians.  ______________________________________________________________________   Should you have questions after your visit to the [facility], please contact our office at [phone] between the hours of 8:30 and 4:30 p.m.    Voicemails left after 4:30p.m will not be returned until the following business day.     For prescription refill requests, please have your pharmacy contact us directly.  Please also try to allow 48 hours for prescription requests.     Please contact the scheduling department for questions regarding scheduling.  For scheduling of procedures such as PET scans, CT scans, MRI, Ultrasound, etc please contact central scheduling at [phone].     Resources For Cancer Patients and Caregivers:    Oncolink.org:  A wonderful resource for patients and healthcare providers for information regarding your disease, ways to tract your treatment, what to expect, etc.      American Cancer Society:  800-227-2345  Can help patients locate various types of support and financial assistance   Cancer Care: 1-800-813-HOPE (4673) Provides financial assistance, online support groups, medication/co-pay assistance.     [place] DSS:  336-641-3447 Where to apply  for food stamps, Medicaid, and utility assistance   Medicare Rights Center: 800-333-4114 Helps people with Medicare understand their rights and benefits, navigate the Medicare system, and secure the quality healthcare they deserve   SCAT: 336-333-6589 [place] [employer]'s shared-ride transportation service for eligible riders who have a disability that prevents them from riding the fixed route bus.     For additional information on assistance programs please contact our social worker:   Abigail Elmore:  [phone]  

## 2018-08-29 NOTE — Telephone Encounter (Signed)
Check [place] registry last filled 07/31/2018.Marland KitchenJohny Lane

## 2018-08-29 NOTE — Progress Notes (Signed)
Went to infusion to introduce myself to patient as Arboriculturist and to offer available resources. There are no foundations that have copay assistance available for his diagnosis.   Discussed one-time $700 Engineer, drilling. Patient states he and spouse are well off and don't need the assistance but apprreciates me stopping by.  Left my card for any additional financial questions or concerns.

## 2018-08-29 NOTE — Progress Notes (Signed)
During Adriamycin push blood return checked every minute. Good blood return noted each time. Patient tolerated well.

## 2018-08-30 ENCOUNTER — Other Ambulatory Visit: Payer: Self-pay | Admitting: Internal Medicine

## 2018-08-30 ENCOUNTER — Other Ambulatory Visit: Payer: Self-pay

## 2018-08-31 ENCOUNTER — Encounter: Payer: Self-pay | Admitting: Physical Therapy

## 2018-08-31 ENCOUNTER — Inpatient Hospital Stay: Payer: Medicare Other

## 2018-08-31 ENCOUNTER — Telehealth: Payer: Self-pay

## 2018-08-31 ENCOUNTER — Ambulatory Visit: Payer: Medicare Other | Admitting: Physical Therapy

## 2018-08-31 DIAGNOSIS — M6281 Muscle weakness (generalized): Secondary | ICD-10-CM

## 2018-08-31 DIAGNOSIS — C8330 Diffuse large B-cell lymphoma, unspecified site: Secondary | ICD-10-CM

## 2018-08-31 DIAGNOSIS — Z5112 Encounter for antineoplastic immunotherapy: Secondary | ICD-10-CM | POA: Diagnosis not present

## 2018-08-31 DIAGNOSIS — R2689 Other abnormalities of gait and mobility: Secondary | ICD-10-CM

## 2018-08-31 DIAGNOSIS — Z7189 Other specified counseling: Secondary | ICD-10-CM

## 2018-08-31 DIAGNOSIS — Z9181 History of falling: Secondary | ICD-10-CM

## 2018-08-31 MED ORDER — PEGFILGRASTIM-CBQV 6 MG/0.6ML ~~LOC~~ SOSY
6.0000 mg | PREFILLED_SYRINGE | Freq: Once | SUBCUTANEOUS | Status: AC
  Administered 2018-08-31: 6 mg via SUBCUTANEOUS

## 2018-08-31 MED ORDER — PEGFILGRASTIM-CBQV 6 MG/0.6ML ~~LOC~~ SOSY
PREFILLED_SYRINGE | SUBCUTANEOUS | Status: AC
  Filled 2018-08-31: qty 0.6

## 2018-08-31 MED ORDER — HYDROCODONE-ACETAMINOPHEN 7.5-325 MG PO TABS: 1.0000 | ORAL_TABLET | Freq: Four times a day (QID) | ORAL | 0 refills | Status: DC | PRN

## 2018-08-31 NOTE — Telephone Encounter (Signed)
Spoke with patient concerning his new schedule. Per 11/19 los Irene Limbo will be out on 12/30 - 1/1 and requested that patient be placed on 1/2 and inj 1/4. Patient is aware and agreed on these dates and time. Will pick up a new schedule when he comes in today for his injection

## 2018-08-31 NOTE — Therapy (Signed)
[place], [place], [address] Phone: [phone]   Fax:  [phone]  Physical Therapy Treatment  Patient Details  Name: [name] MRN: [id number] Date of Birth: Oct 31, 1945 Referring Provider (PT): Dr. Irene Limbo   Encounter Date: 08/31/2018  PT End of Session - 08/31/18 1056    Visit Number  6    Number of Visits  12    Date for PT Re-Evaluation  09/21/18    PT Start Time  5035    PT Stop Time  1057    PT Time Calculation (min)  42 min    Activity Tolerance  Patient tolerated treatment well    Behavior During Therapy  [facility] for tasks assessed/performed       Past Medical History:  Diagnosis Date  . A-fib (HCC)    hx.- sinus rhythm now  . Acid reflux   . AKI (acute kidney injury) (HCC)    mild  . Anxiety   . Arthritis   . Atrial dilatation, bilateral    Severly, enlargement  per ECHO 11/03/16  . BPH (benign prostatic hyperplasia)   . CAD (coronary artery disease)   . Depression   . Dislocation of internal right hip prosthesis, sequela   . Dysrhythmia    hx a fib  . Elevated brain natriuretic peptide (BNP) level   . Environmental allergies   . Fracture of wrist 10/23/13   LEFT  . High cholesterol   . History of Graves' disease   . History of kidney stones    surg. removal 02/2017  . History of syncope   . HLD (hyperlipidemia)   . Hypertension   . Hypothyroidism    thyroid removed  . Left knee injury    cap dislocated  . Low testosterone, possible hypogonadism 10/01/2012  . Lumbago   . Metabolic bone disease   . Multiple falls   . Osteoporosis    Severe  . Plaque psoriasis   . Sleep apnea    cpap machine-settings 17  . Thrombocytopenia ([place])   . Thyroid disease    HX GRAVES DISEASE  . Transfusion history    s/p 12'13 hip surgery  . Unsteady gait   . UTI (urinary tract infection)   . Vertebral compression fracture (HCC)    L1-wears brace    Past Surgical History:   Procedure Laterality Date  . APPENDECTOMY    . CARDIAC CATHETERIZATION  2013  . CATARACT EXTRACTION, BILATERAL    . COLONOSCOPY    . CYSTOSCOPY WITH RETROGRADE PYELOGRAM, URETEROSCOPY AND STENT PLACEMENT Left 11/07/2017   Procedure: CYSTOSCOPY WITH RETROGRADE PYELOGRAM, URETEROSCOPY AND STENT PLACEMENT;  Surgeon: Cleon Gustin, MD;  Location: [facility];  Service: Urology;  Laterality: Left;  . CYSTOSCOPY/URETEROSCOPY/HOLMIUM LASER/STENT PLACEMENT Right 03/11/2017   Procedure: CYSTOSCOPY/URETEROSCOPYSTENT PLACEMENT right ureter retrograde pylegram;  Surgeon: Cleon Gustin, MD;  Location: [facility];  Service: Urology;  Laterality: Right;  . HARDWARE REMOVAL  10/05/2012   Procedure: HARDWARE REMOVAL;  Surgeon: [name], MD;  Location: [facility];  Service: Orthopedics;  Laterality: Right;  REMOVING  STRYKER  GAMMA NAIL  . HARDWARE REMOVAL Right 07/03/2013   Procedure: HARDWARE REMOVAL RIGHT TIBIA ;  Surgeon: Rozanna Box, MD;  Location: [place];  Service: Orthopedics;  Laterality: Right;  . HIP CLOSED REDUCTION Right 10/15/2013   Procedure: CLOSED MANIPULATION HIP;  Surgeon: [name], MD;  Location: [facility];  Service: Orthopedics;  Laterality: Right;  . hip revision  June 2017  . HOLMIUM LASER APPLICATION Right 11/15/971   Procedure: HOLMIUM LASER APPLICATION;  Surgeon: Cleon Gustin, MD;  Location: [facility];  Service: Urology;  Laterality: Right;  . INCISION AND DRAINAGE HIP Right 11/16/2013   Procedure: IRRIGATION AND DEBRIDEMENT RIGHT HIP;  Surgeon: [name], MD;  Location: [facility];  Service: Orthopedics;  Laterality: Right;  . IR IMAGING GUIDED PORT INSERTION  06/22/2018  . IR URETERAL STENT LEFT NEW ACCESS W/O SEP NEPHROSTOMY CATH  10/24/2017  . JOINT REPLACEMENT     hip-right x2  . KNEE SURGERY Bilateral 2012   Total knee replacements  . NEPHROLITHOTOMY Right 02/08/2017   Procedure: NEPHROLITHOTOMY PERCUTANEOUS WITH SURGEON ACCESS;  Surgeon: Cleon Gustin, MD;   [facility];  Service: Urology;  Laterality: Right;  . NEPHROLITHOTOMY Left 10/24/2017   Procedure: NEPHROLITHOTOMY PERCUTANEOUS;  Surgeon: Cleon Gustin, MD;  [facility];  Service: Urology;  Laterality: Left;  . ORIF TIBIA FRACTURE Right 02/06/2013   Procedure: OPEN REDUCTION INTERNAL FIXATION (ORIF) TIBIA FRACTURE WITH IM ROD FIBULA;  Surgeon: Rozanna Box, MD;  Location: [place];  Service: Orthopedics;  Laterality: Right;  . ORIF TIBIA FRACTURE Right 07/03/2013   Procedure: RIGHT TIBIA NON UNION REPAIR ;  Surgeon: Rozanna Box, MD;  Location: [place];  Service: Orthopedics;  Laterality: Right;  . ORIF WRIST FRACTURE  10/02/2012   Procedure: OPEN REDUCTION INTERNAL FIXATION (ORIF) WRIST FRACTURE;  Surgeon: Roseanne Kaufman, MD;  [facility];  Service: Orthopedics;  Laterality: Right;  WITH   ANTIBIOTIC  CEMENT  . ORIF WRIST FRACTURE Left 10/28/2013   Procedure: OPEN REDUCTION INTERNAL FIXATION (ORIF) WRIST FRACTURE with allograft;  Surgeon: Roseanne Kaufman, MD;  Location[facility];  Service: Orthopedics;  Laterality: Left;  DVR Plate  . QUADRICEPS TENDON REPAIR Left 07/15/2017   Procedure: REPAIR QUADRICEP TENDON;  Surgeon: Frederik Pear, MD;  Location: [facility];  Service: Orthopedics;  Laterality: Left;  . right femur surgery  05/2012  . THYROIDECTOMY  02/1986  . TOTAL HIP REVISION  10/05/2012   Procedure: TOTAL HIP REVISION;  Surgeon: [name], MD;  [facility];  Service: Orthopedics;  Laterality: Right;  RIGHT TOTAL HIP REVISION  . TOTAL HIP REVISION Right 09/17/2013   Procedure: REVISION RIGHT TOTAL HIP ARTHROPLASTY ;  Surgeon: [name], MD;  [facility];  Service: Orthopedics;  Laterality: Right;  . TOTAL HIP REVISION Right 10/26/2013   Procedure: REVISION RIGHT TOTAL HIP ARTHROPLASTY;  Surgeon: [name], MD;  [facility];  Service: Orthopedics;  Laterality: Right;  . TOTAL HIP REVISION  03/2016  . TOTAL KNEE REVISION Left 04/11/2017   Procedure:  TOTAL KNEE REVISION PATELLA and TIBIA;  Surgeon: Frederik Pear, MD;  Location: [facility];  Service: Orthopedics;  Laterality: Left;  . WRIST FRACTURE SURGERY  05/2012    There were no vitals filed for this visit.  Subjective Assessment - 08/31/18 1019    Subjective  Pt had an infusion on Tuesday and he is feeling a little wobbly today He feels like he can do lots of things in sitting.     Pertinent History  2013 fall resulting in a femur break on the Rt leg resulting in multiple surgeries due to failures including hip replacements another broken tibia leading to the discovery of osteoporosis and a metabolic bone disease.  After forteo injections x 2 years he is now in the osteopenia category.  Has a new scan coming  up in a few weeks.  prior to the cancer diagnosis he was able to be confident with a cane.  Then was diagnosed with diffuse non-hogkin large cell B-lymphoma 07/09/18 located in tonsils and Rt groin. Treatment consists of an infusion every 3 weeks hopefully for a total of 5, A-fib controlled, HTN controlled with medication    Limitations  Walking;Standing;House hold activities    Patient Stated Goals  get my balance back    Currently in Pain?  Yes   he has pain when walking, none when sitting down                      [facility] Adult PT Treatment/Exercise - 08/31/18 0001      Exercises   Exercises  Shoulder;Lumbar;Knee/Hip;Ankle;Other Exercises    Other Exercises   diaphragmatic breating , bilateral PNF elevation with turnk extension       Knee/Hip Exercises: Seated   Marching  Strengthening;Right;Left;20 reps    Abduction/Adduction   Strengthening;Right;Left;2 sets;10 reps    Abd/Adduction Limitations  green band for abduction, purple ball squeeze for adduction       Shoulder Exercises: Seated   External Rotation  Strengthening;Right;Left;10 reps;Theraband    Theraband Level (Shoulder External Rotation)  Level 3 (Green)    External Rotation Limitations  also did short  alternating diagonal pull aparts with green band     Other Seated Exercises  squeeze purple ball and diagonally elevate 5 times in each direction    cues to keep right shoulder down    Other Seated Exercises  rows with green theraband with elbows bent       Shoulder Exercises: Standing   Other Standing Exercises  standing at wall and reaching up with both hands alternating for trunk extenstion and balance work       Ankle Exercises: Seated   Heel Raises  Right;Left;10 reps                  PT Long Term Goals - 08/10/18 1503      PT LONG TERM GOAL #1   Title  Pt will improve sit to stand x 30sec from 21" surface to 8 times     Time  6    Period  Weeks    Status  New    Target Date  09/21/18      PT LONG TERM GOAL #2   Title  Pt will improve [place] balance scale to at least 10/40    Time  6    Period  Weeks    Target Date  09/21/18      PT LONG TERM GOAL #3   Title  Pt will be ind with LRAD for home and community use    Time  6    Period  Weeks    Status  New    Target Date  09/21/18      PT LONG TERM GOAL #4   Title  Pt will perform TUG with LRAD at 28sec or less     Time  6    Period  Weeks    Status  New    Target Date  09/21/18      PT LONG TERM GOAL #5   Title  Pt will report no instances of Rt LE giving out x 5 days at home     Time  6    Period  Weeks    Status  New    Target Date  09/21/18            Plan - 08/31/18 1057    Clinical Impression Statement  Pt not feeling great today as he has an infusion this work, but he wanted to work hard on his core and was able to do particpate well in sitting.  In standing, he tried, but had limited endurance and experience SOB after a few minutes of exercise.  Talked to pt about scheduling appointments in his second and third week after chemo but he says he is having trouble doing the exercise on his own at home     Rehab Potential  Good    PT Frequency  2x / week    PT Duration  6 weeks    PT  Treatment/Interventions  ADLs/Self Care Home Management;Therapeutic exercise;Patient/family education;Gait training;Stair training;Balance training;Neuromuscular re-education    PT Next Visit Plan  work on left leg balance as well as strengthening on right leg, core work. Continue gait tolerance/endurance with RW, work on turns, continue LE strength working towards balance       Patient will benefit from skilled therapeutic intervention in order to improve the following deficits and impairments:  Abnormal gait, Decreased activity tolerance, Decreased endurance, Decreased strength, Decreased balance, Difficulty walking  Visit Diagnosis: Other abnormalities of gait and mobility  History of falling  Muscle weakness (generalized)     Problem List Patient Active Problem List   Diagnosis Date Noted  . Counseling regarding advance care planning and goals of care 07/09/2018  . Diffuse large B-cell lymphoma ([place]) 06/23/2018  . Port-A-Cath in place 06/23/2018  . Hypogonadism in male 11/22/2017  . Nephrolithiasis 10/24/2017  . Recurrent dislocation of left patella 07/15/2017  . Recurrent subluxation of patella, left 07/08/2017  . S/P revision of total knee 04/11/2017  . Failed total knee, left, initial encounter ([place]) 04/09/2017  . Renal calculus, right 02/08/2017  . Protein-calorie malnutrition, severe ([place]) 12/08/2016  . Chronic obstructive pulmonary disease ([place]) 12/08/2016  . Staghorn renal calculus 11/05/2016  . Wound drainage right hip 11/15/2013  . Expected blood loss anemia 09/18/2013  . Hyponatremia 09/18/2013  . S/P right TH revision 09/17/2013  . Nonunion, fracture, Right tibia  07/03/2013  . UTI (urinary tract infection) 07/03/2013  . Pathological fracture due to osteoporosis with delayed healing 04/24/2013  . Osteoporosis with fracture 04/24/2013  . Rash and nonspecific skin eruption 03/08/2013  . Syncope 02/21/2013  . PAF (paroxysmal atrial fibrillation) ([place]) 02/21/2013   . Chronic anticoagulation 02/21/2013  . Fall 02/08/2013  . Physical deconditioning 02/08/2013  . Low back pain radiating to both legs 02/08/2013  . Compression fracture of L1 lumbar vertebra (HCC) 02/08/2013  . Right tibial fracture 02/05/2013  . Obesity, unspecified 02/05/2013  . Osteomyelitis of right wrist ([place]) 11/02/2012  . Anemia 10/06/2012  . HTN (hypertension) 10/03/2012  . Psoriasis 10/03/2012  . Dyslipidemia 10/03/2012  . Vitamin D deficiency 10/03/2012  . GERD (gastroesophageal reflux disease) 10/03/2012  . Hyperglycemia 10/03/2012  . Anxiety 10/03/2012  . Depression 10/03/2012  . CAD (coronary artery disease) 10/03/2012  . DJD (degenerative joint disease) 10/03/2012  . Chronic gingivitis 10/03/2012  . Low testosterone, possible hypogonadism 10/01/2012  . Normocytic anemia 09/30/2012  . Right femoral fracture ([place]) 09/29/2012  . Right forearm fracture 09/29/2012  . Atrial fibrillation ([place]) 09/29/2012  . Hypothyroidism 09/29/2012  . History of recurrent UTIs 09/29/2012   Donato Heinz. Owens Shark, PT  [place] Levo 08/31/2018, 11:00 AM  [place]  [place], [place], [address] Phone: [phone]   Fax:  [phone]  Name: [name] MRN: [id number] Date of Birth: October 12, 1945

## 2018-08-31 NOTE — Telephone Encounter (Signed)
MD approved and sent electronically to [facility]../lmb  

## 2018-08-31 NOTE — Telephone Encounter (Signed)
Error opening  

## 2018-08-31 NOTE — Patient Instructions (Signed)
Pegfilgrastim injection What is this medicine? PEGFILGRASTIM (PEG fil gra stim) is a long-acting granulocyte colony-stimulating factor that stimulates the growth of neutrophils, a type of white blood cell important in the body's fight against infection. It is used to reduce the incidence of fever and infection in patients with certain types of cancer who are receiving chemotherapy that affects the bone marrow, and to increase survival after being exposed to high doses of radiation. This medicine may be used for other purposes; ask your health care provider or pharmacist if you have questions. COMMON BRAND NAME(S): Neulasta What should I tell my health care provider before I take this medicine? They need to know if you have any of these conditions: -kidney disease -latex allergy -ongoing radiation therapy -sickle cell disease -skin reactions to acrylic adhesives (On-Body Injector only) -an unusual or allergic reaction to pegfilgrastim, filgrastim, other medicines, foods, dyes, or preservatives -pregnant or trying to get pregnant -breast-feeding How should I use this medicine? This medicine is for injection under the skin. If you get this medicine at home, you will be taught how to prepare and give the pre-filled syringe or how to use the On-body Injector. Refer to the patient Instructions for Use for detailed instructions. Use exactly as directed. Tell your healthcare provider immediately if you suspect that the On-body Injector may not have performed as intended or if you suspect the use of the On-body Injector resulted in a missed or partial dose. It is important that you put your used needles and syringes in a special sharps container. Do not put them in a trash can. If you do not have a sharps container, call your pharmacist or healthcare provider to get one. Talk to your pediatrician regarding the use of this medicine in children. While this drug may be prescribed for selected conditions,  precautions do apply. Overdosage: If you think you have taken too much of this medicine contact a poison control center or emergency room at once. NOTE: This medicine is only for you. Do not share this medicine with others. What if I miss a dose? It is important not to miss your dose. Call your doctor or health care professional if you miss your dose. If you miss a dose due to an On-body Injector failure or leakage, a new dose should be administered as soon as possible using a single prefilled syringe for manual use. What may interact with this medicine? Interactions have not been studied. Give your health care provider a list of all the medicines, herbs, non-prescription drugs, or dietary supplements you use. Also tell them if you smoke, drink alcohol, or use illegal drugs. Some items may interact with your medicine. This list may not describe all possible interactions. Give your health care provider a list of all the medicines, herbs, non-prescription drugs, or dietary supplements you use. Also tell them if you smoke, drink alcohol, or use illegal drugs. Some items may interact with your medicine. What should I watch for while using this medicine? You may need blood work done while you are taking this medicine. If you are going to need a MRI, CT scan, or other procedure, tell your doctor that you are using this medicine (On-Body Injector only). What side effects may I notice from receiving this medicine? Side effects that you should report to your doctor or health care professional as soon as possible: -allergic reactions like skin rash, itching or hives, swelling of the face, lips, or tongue -dizziness -fever -pain, redness, or irritation at site   where injected -pinpoint red spots on the skin -red or dark-brown urine -shortness of breath or breathing problems -stomach or side pain, or pain at the shoulder -swelling -tiredness -trouble passing urine or change in the amount of urine Side  effects that usually do not require medical attention (report to your doctor or health care professional if they continue or are bothersome): -bone pain -muscle pain This list may not describe all possible side effects. Call your doctor for medical advice about side effects. You may report side effects to FDA at 1-800-FDA-1088. Where should I keep my medicine? Keep out of the reach of children. Store pre-filled syringes in a refrigerator between 2 and 8 degrees C (36 and 46 degrees F). Do not freeze. Keep in carton to protect from light. Throw away this medicine if it is left out of the refrigerator for more than 48 hours. Throw away any unused medicine after the expiration date. NOTE: This sheet is a summary. It may not cover all possible information. If you have questions about this medicine, talk to your doctor, pharmacist, or health care provider.  2018 Elsevier/Gold Standard (2016-09-23 12:58:03)  

## 2018-09-01 ENCOUNTER — Telehealth: Payer: Self-pay

## 2018-09-01 MED ORDER — BELSOMRA 15 MG PO TABS: 1.0000 | ORAL_TABLET | Freq: Every day | ORAL | 3 refills | Status: DC

## 2018-09-01 NOTE — Telephone Encounter (Signed)
Needs OV q 3 mo

## 2018-09-01 NOTE — Telephone Encounter (Signed)
Pt called to f/u on denial. Pt states he has a couple weeks left but gets thru mail order pharmacy. I advised pt he is past due for 3 month f/u. Last OV 02/2018. Pt is scheduled for CPE 10/02/18. Pt is willing to come in sooner if it works with his schedule and Dr. Judeen Hammans. Please advise. Ph # [phone].   Pt requests on future mychart responses to notify him of the reason for a denial please.   Pt uses [facility]

## 2018-09-04 ENCOUNTER — Ambulatory Visit: Payer: Medicare Other | Admitting: Rehabilitation

## 2018-09-04 MED ORDER — HYDROCODONE-ACETAMINOPHEN 7.5-325 MG PO TABS: 1.0000 | ORAL_TABLET | Freq: Four times a day (QID) | ORAL | 0 refills | Status: DC | PRN

## 2018-09-04 NOTE — Telephone Encounter (Addendum)
Pls inform the pt that I need to see him q 3 mo per our Opioid office policy. Pls sch appt before Dec 23 I'll renew Rx Thx

## 2018-09-04 NOTE — Addendum Note (Signed)
Addended by: Cassandria Anger on: 09/04/2018 05:07 PM   Modules accepted: Orders

## 2018-09-04 NOTE — Telephone Encounter (Signed)
Pt has appt on 10/02/18 please advise about refill

## 2018-09-05 NOTE — Telephone Encounter (Signed)
LM notifying pt RX up front to pick up and to reschedule appt

## 2018-09-06 ENCOUNTER — Ambulatory Visit (INDEPENDENT_AMBULATORY_CARE_PROVIDER_SITE_OTHER): Payer: Medicare Other | Admitting: Internal Medicine

## 2018-09-06 ENCOUNTER — Encounter: Payer: Self-pay | Admitting: Internal Medicine

## 2018-09-06 ENCOUNTER — Ambulatory Visit: Payer: Medicare Other | Admitting: Rehabilitation

## 2018-09-06 VITALS — BP 110/60 | HR 71 | Temp 97.3°F | Ht 75.0 in | Wt 293.0 lb

## 2018-09-06 DIAGNOSIS — I480 Paroxysmal atrial fibrillation: Secondary | ICD-10-CM | POA: Diagnosis not present

## 2018-09-06 DIAGNOSIS — E43 Unspecified severe protein-calorie malnutrition: Secondary | ICD-10-CM

## 2018-09-06 DIAGNOSIS — I2510 Atherosclerotic heart disease of native coronary artery without angina pectoris: Secondary | ICD-10-CM | POA: Diagnosis not present

## 2018-09-06 DIAGNOSIS — C8330 Diffuse large B-cell lymphoma, unspecified site: Secondary | ICD-10-CM

## 2018-09-06 DIAGNOSIS — Z7901 Long term (current) use of anticoagulants: Secondary | ICD-10-CM

## 2018-09-06 DIAGNOSIS — Z23 Encounter for immunization: Secondary | ICD-10-CM | POA: Diagnosis not present

## 2018-09-06 DIAGNOSIS — M79604 Pain in right leg: Secondary | ICD-10-CM

## 2018-09-06 DIAGNOSIS — M545 Low back pain: Secondary | ICD-10-CM

## 2018-09-06 MED ORDER — HYDROCODONE-ACETAMINOPHEN 7.5-325 MG PO TABS: 1.0000 | ORAL_TABLET | Freq: Four times a day (QID) | ORAL | 0 refills | Status: DC | PRN

## 2018-09-06 NOTE — Assessment & Plan Note (Signed)
On Chemo 

## 2018-09-06 NOTE — Assessment & Plan Note (Signed)
Diet discussed 

## 2018-09-06 NOTE — Assessment & Plan Note (Signed)
Norco prn  Potential benefits of a long term opioids use as well as potential risks (i.e. addiction risk, apnea etc) and complications (i.e. Somnolence, constipation and others) were explained to the patient and were aknowledged. 

## 2018-09-06 NOTE — Progress Notes (Signed)
Subjective:  Patient ID: [name], male    DOB: [birth year]  Age: 72 y.o. MRN: [id number]  CC: No chief complaint on file.   HPI [name] presents for chronic pain, neuropathy pains, depression On chemo q 3 wks for lymphoma - weak  Outpatient Medications Prior to Visit  Medication Sig Dispense Refill  . alfuzosin (UROXATRAL) 10 MG 24 hr tablet Take 10 mg by mouth at bedtime.    [name] amoxicillin (AMOXIL) 500 MG capsule Take 2,000 mg by mouth See admin instructions. Takes 4 capsules 1 hour prior to dental procedures    . atorvastatin (LIPITOR) 80 MG tablet Take 80 mg by mouth daily.    . BELSOMRA 15 MG TABS Take 1 tablet by mouth daily. 30 tablet 3  . buPROPion (WELLBUTRIN XL) 300 MG 24 hr tablet TAKE 1 TABLET DAILY WITH  BREAKFAST BY MOUTH. (Patient taking differently: Take 300 mg by mouth daily. ) 90 tablet 3  . Calcium Citrate-Vitamin D (CALCIUM CITRATE + D PO) Take 1 tablet by mouth 2 (two) times daily.     . Cholecalciferol (VITAMIN D-3 PO) Take 2,000 Units by mouth 2 (two) times daily.     . clonazePAM (KLONOPIN) 1 MG tablet Take 1 tablet (1 mg total) by mouth every 6 (six) hours as needed for anxiety. TAKE 1 TABLET BY MOUTH  EVERY 6 HOURS prn 360 tablet 1  . denosumab (PROLIA) 60 MG/ML SOLN injection Inject 60 mg into the skin every 6 (six) months. Administer in upper arm, thigh, or abdomen    . diltiazem (CARDIZEM CD) 240 MG 24 hr capsule TAKE 1 CAPSULE BY MOUTH  DAILY 90 capsule 3  . doxycycline (VIBRA-TABS) 100 MG tablet TAKE 1 TABLET BY MOUTH  DAILY 90 tablet 1  . DULoxetine (CYMBALTA) 60 MG capsule TAKE 1 CAPSULE BY MOUTH  TWICE DAILY 180 capsule 1  . EPINEPHrine (EPIPEN 2-PAK) 0.3 mg/0.3 mL IJ SOAJ injection Inject 0.3 mg into the muscle daily as needed (for anaphylactic reaction).    [name] etanercept (ENBREL) 50 MG/ML injection Inject 50 mg into the skin 2 (two) times a week. Tuesday & Friday    . ezetimibe (ZETIA) 10 MG tablet Take 1 tablet (10 mg total) by mouth  daily. 90 tablet 3  . ferrous sulfate 325 (65 FE) MG tablet Take 325 mg by mouth 2 (two) times daily with a meal.    . HYDROcodone-acetaminophen (NORCO) 7.5-325 MG tablet Take 1 tablet by mouth 4 (four) times daily as needed for moderate pain. Please fill on 09/02/18 120 tablet 0  . ipratropium (ATROVENT) 0.03 % nasal spray Place 1 spray into the nose daily as needed.    [name] levothyroxine (SYNTHROID, LEVOTHROID) 200 MCG tablet TAKE 1 TABLET DAILY AT 6 AM BY MOUTH. (Patient taking differently: Take 225 mcg by mouth daily before breakfast. ) 90 tablet 3  . levothyroxine (SYNTHROID, LEVOTHROID) 25 MCG tablet TAKE 1 TABLET BY MOUTH  DAILY BEFORE BREAKFAST. (Patient taking differently: Take 225 mcg by mouth daily before breakfast. ) 90 tablet 3  . lidocaine-prilocaine (EMLA) cream Apply to affected area once 30 g 3  . loratadine (CLARITIN) 10 MG tablet Take 10 mg by mouth daily.     [name] LORazepam (ATIVAN) 0.5 MG tablet Take 1 tablet (0.5 mg total) by mouth every 6 (six) hours as needed (Nausea or vomiting). 30 tablet 0  . metoprolol tartrate (LOPRESSOR) 25 MG tablet Take 1 tablet (25 mg total) by mouth 2 (two)  times daily. 60 tablet 3  . mirabegron ER (MYRBETRIQ) 25 MG TB24 tablet Take 25 mg by mouth at bedtime.    . Multiple Vitamin (MULTIVITAMIN WITH MINERALS) TABS tablet Take 1 tablet by mouth daily. Men's One-A-Day 70+    . nitroGLYCERIN (NITROSTAT) 0.4 MG SL tablet Place 0.4 mg under the tongue every 5 (five) minutes as needed for chest pain. x3 doses as needed for chest pain    . omeprazole (PRILOSEC) 40 MG capsule TAKE 1 CAPSULE DAILY BEFORE BREAKFAST BY MOUTH. (Patient taking differently: Take 40 mg by mouth daily. ) 90 capsule 3  . ondansetron (ZOFRAN) 8 MG tablet Take 1 tablet (8 mg total) by mouth 2 (two) times daily as needed for refractory nausea / vomiting. chemotherapy. 30 tablet 1  . predniSONE (DELTASONE) 20 MG tablet Take 3 tablets (60 mg total) by mouth daily. Take on days 1-5 of  chemotherapy. 50 tablet 0  . PROAIR HFA 108 (90 Base) MCG/ACT inhaler Inhale 1 puff into the lungs every 4 (four) hours as needed.    . Probiotic Product (PROBIOTIC PO) Take 1 capsule by mouth daily with breakfast.    . prochlorperazine (COMPAZINE) 10 MG tablet Take 1 tablet (10 mg total) by mouth every 6 (six) hours as needed (Nausea or vomiting). 30 tablet 6  . testosterone enanthate (DELATESTRYL) 200 MG/ML injection INJECT 1ML (200MG  TOTAL) INTO THE MUSCLE EVERY 14 DAYS. FOR INTRAMUSCULAR USE ONLY. DISCARD VIAL AFTER 28 DAYS. 5 mL 4  . tiZANidine (ZANAFLEX) 4 MG tablet TAKE 1 TABLET BY MOUTH  EVERY 6 HOURS AS NEEDED FOR MUSCLE SPASM(S) 360 tablet 1  . triamcinolone ointment (KENALOG) 0.5 % APPLY TO AFFECTED AREA TWICE A DAY. 30 g 0  . vitamin C (ASCORBIC ACID) 500 MG tablet Take 500 mg by mouth daily.    Alveda Reasons 10 MG TABS tablet TAKE 1 TABLET DAILY BY  MOUTH. 90 tablet 3   No facility-administered medications prior to visit.     ROS: Review of Systems  Constitutional: Positive for fatigue and unexpected weight change. Negative for appetite change.  HENT: Negative for congestion, nosebleeds, sneezing, sore throat and trouble swallowing.   Eyes: Negative for itching and visual disturbance.  Respiratory: Negative for cough.   Cardiovascular: Negative for chest pain, palpitations and leg swelling.  Gastrointestinal: Negative for abdominal distention, blood in stool, diarrhea and nausea.  Genitourinary: Negative for frequency and hematuria.  Musculoskeletal: Negative for back pain, gait problem, joint swelling and neck pain.  Skin: Negative for rash.  Neurological: Positive for dizziness and weakness. Negative for tremors and speech difficulty.  Psychiatric/Behavioral: Negative for agitation, dysphoric mood, sleep disturbance and suicidal ideas. The patient is not nervous/anxious.     Objective:  BP 110/60 (BP Location: Left Arm, Patient Position: Sitting, Cuff Size: Normal)   Pulse 71    Temp (!) 97.3 F (36.3 C) (Oral)   Ht 6\' 3"  (1.905 m)   Wt 293 lb (132.9 kg)   SpO2 98%   BMI 36.62 kg/m   BP Readings from Last 3 Encounters:  09/06/18 110/60  08/29/18 122/63  08/29/18 120/67    Wt Readings from Last 3 Encounters:  09/06/18 293 lb (132.9 kg)  08/29/18 298 lb 3.2 oz (135.3 kg)  08/17/18 (!) 304 lb 6.4 oz (138.1 kg)    Physical Exam  Constitutional: He is oriented to person, place, and time. He appears well-developed. No distress.  NAD  HENT:  Mouth/Throat: Oropharynx is clear and moist.  Eyes:  Pupils are equal, round, and reactive to light. Conjunctivae are normal.  Neck: Normal range of motion. No JVD present. No thyromegaly present.  Cardiovascular: Normal rate, regular rhythm, normal heart sounds and intact distal pulses. Exam reveals no gallop and no friction rub.  No murmur heard. Pulmonary/Chest: Effort normal and breath sounds normal. No respiratory distress. He has no wheezes. He has no rales. He exhibits no tenderness.  Abdominal: Soft. Bowel sounds are normal. He exhibits no distension and no mass. There is no tenderness. There is no rebound and no guarding.  Musculoskeletal: Normal range of motion. He exhibits tenderness. He exhibits no edema.  Lymphadenopathy:    He has no cervical adenopathy.  Neurological: He is alert and oriented to person, place, and time. He has normal reflexes. No cranial nerve deficit. He exhibits normal muscle tone. He displays a negative Romberg sign. Coordination abnormal. Gait normal.  Skin: Skin is warm and dry. No rash noted.  Psychiatric: He has a normal mood and affect. His behavior is normal. Judgment and thought content normal.  walker LS tender  Lab Results  Component Value Date   WBC 6.6 08/29/2018   HGB 13.5 08/29/2018   HCT 41.1 08/29/2018   PLT 181 08/29/2018   GLUCOSE 132 (H) 08/29/2018   CHOL 144 07/13/2018   TRIG 72 07/13/2018   HDL 47 07/13/2018   LDLCALC 83 07/13/2018   ALT 50 (H)  08/29/2018   AST 38 08/29/2018   NA 140 08/29/2018   K 4.7 08/29/2018   CL 104 08/29/2018   CREATININE 1.41 (H) 08/29/2018   BUN 26 (H) 08/29/2018   CO2 25 08/29/2018   TSH 1.71 08/16/2017   PSA 1.12 02/20/2018   INR 1.02 06/22/2018   HGBA1C 5.8 (H) 02/06/2013    Nm Pet Image Restag (ps) Skull Base To Thigh  Result Date: 08/22/2018 CLINICAL DATA:  Subsequent treatment strategy for diffuse large B-cell lymphoma. EXAM: NUCLEAR MEDICINE PET SKULL BASE TO THIGH TECHNIQUE: 15.1 mCi F-18 FDG was injected intravenously. Full-ring PET imaging was performed from the skull base to thigh after the radiotracer. CT data was obtained and used for attenuation correction and anatomic localization. Fasting blood glucose: 93 mg/dl COMPARISON:  06/16/2018 FINDINGS: Mediastinal blood pool activity: SUV max 2.51 Liver activity: SUV max 3.96. NECK: The left palatine tonsillar mass has decreased in size in the interval. SUV max is equal to 3.91. Previously 19.8. Decreased FDG uptake associated with the right tonsillar region with SUV max of 3.58. Previously 9.8. Resolution of previous hypermetabolic left level 2 lymph nodes. Previously noted large lymph node superficial to the right sternocleidomastoid muscle 2.2 cm short axis and has an SUV max of 2.4. Previously this measured 2.3 cm within SUV max of 2.9. Incidental CT findings: none CHEST: No hypermetabolic axillary, supraclavicular, mediastinal, or hilar lymph nodes. No pleural effusions.  No hypermetabolic pulmonary nodules. Incidental CT findings: Aortic atherosclerosis. Calcifications in the LAD, left circumflex and RCA coronary artery noted. ABDOMEN/PELVIS: There is no abnormal radiotracer uptake identified within the liver, pancreas, spleen, or adrenal glands. No hypermetabolic abdominal lymph nodes. Index right inguinal lymph node has resolved in the interval. The previous index left inguinal lymph node has also resolved in the interval. Incidental CT findings:  Aortic atherosclerosis. No aneurysm. Bilateral nonobstructing renal calculi. Fat containing umbilical hernia noted. SKELETON: Again noted is a right hip arthroplasty device with cerclage wires and exuberant bone along the proximal stem. Increased radiotracer uptake within this area is again noted within SUV  max of 12.25. Incidental CT findings: Vertebra plana deformity involving L1. Chronic right rib fractures. IMPRESSION: 1. Interval response to therapy. Overall Deauville criteria 3. Interval decrease in size and FDG uptake associated with palatine tonsil lesions. There has been resolution of previous left level-II and bilateral inguinal lymph nodes. 2. Enlarged subcutaneous lesion superficial to the lower right sternocleidomastoid muscle exhibits decreased FDG uptake. Currently Deauville criteria 2. 3. No new or progressive sites of disease identified. 4.  Aortic Atherosclerosis (ICD10-I70.0). 5. Multi vessel coronary artery atherosclerotic calcifications. 6. Unchanged vertebra plana deformity. 7. Kidney stones. Electronically Signed   By: Kerby Moors M.D.   On: 08/22/2018 10:14    Assessment & Plan:   There are no diagnoses linked to this encounter.   No orders of the defined types were placed in this encounter.    Follow-up: No follow-ups on file.  Walker Kehr, MD

## 2018-09-06 NOTE — Assessment & Plan Note (Signed)
Xarelto

## 2018-09-06 NOTE — Assessment & Plan Note (Signed)
No sx's 

## 2018-09-06 NOTE — Assessment & Plan Note (Signed)
No angina 

## 2018-09-12 ENCOUNTER — Ambulatory Visit: Payer: Medicare Other | Attending: Hematology | Admitting: Physical Therapy

## 2018-09-12 ENCOUNTER — Encounter: Payer: Self-pay | Admitting: Physical Therapy

## 2018-09-12 DIAGNOSIS — C8330 Diffuse large B-cell lymphoma, unspecified site: Secondary | ICD-10-CM | POA: Diagnosis present

## 2018-09-12 DIAGNOSIS — Z9181 History of falling: Secondary | ICD-10-CM | POA: Insufficient documentation

## 2018-09-12 DIAGNOSIS — R2689 Other abnormalities of gait and mobility: Secondary | ICD-10-CM | POA: Insufficient documentation

## 2018-09-12 DIAGNOSIS — M6281 Muscle weakness (generalized): Secondary | ICD-10-CM | POA: Insufficient documentation

## 2018-09-12 NOTE — Therapy (Signed)
[place], [place], [address] Phone: [phone]   Fax:  [phone]  Physical Therapy Treatment  Patient Details  Name: [name] MRN: [id number] Date of Birth: 12/16/1945 Referring Provider (PT): Dr. Irene Limbo   Encounter Date: 09/12/2018  PT End of Session - 09/12/18 1714    Visit Number  7    Number of Visits  12    Date for PT Re-Evaluation  09/21/18    PT Start Time  1600    PT Stop Time  1645    PT Time Calculation (min)  45 min    Activity Tolerance  Patient tolerated treatment well    Behavior During Therapy  [facility] for tasks assessed/performed       Past Medical History:  Diagnosis Date  . A-fib (HCC)    hx.- sinus rhythm now  . Acid reflux   . AKI (acute kidney injury) (HCC)    mild  . Anxiety   . Arthritis   . Atrial dilatation, bilateral    Severly, enlargement  per ECHO 11/03/16  . BPH (benign prostatic hyperplasia)   . CAD (coronary artery disease)   . Depression   . Dislocation of internal right hip prosthesis, sequela   . Dysrhythmia    hx a fib  . Elevated brain natriuretic peptide (BNP) level   . Environmental allergies   . Fracture of wrist 10/23/13   LEFT  . High cholesterol   . History of Graves' disease   . History of kidney stones    surg. removal 02/2017  . History of syncope   . HLD (hyperlipidemia)   . Hypertension   . Hypothyroidism    thyroid removed  . Left knee injury    cap dislocated  . Low testosterone, possible hypogonadism 10/01/2012  . Lumbago   . Metabolic bone disease   . Multiple falls   . Osteoporosis    Severe  . Plaque psoriasis   . Sleep apnea    cpap machine-settings 17  . Thrombocytopenia ([place])   . Thyroid disease    HX GRAVES DISEASE  . Transfusion history    s/p 12'13 hip surgery  . Unsteady gait   . UTI (urinary tract infection)   . Vertebral compression fracture (HCC)    L1-wears brace    Past Surgical History:   Procedure Laterality Date  . APPENDECTOMY    . CARDIAC CATHETERIZATION  2013  . CATARACT EXTRACTION, BILATERAL    . COLONOSCOPY    . CYSTOSCOPY WITH RETROGRADE PYELOGRAM, URETEROSCOPY AND STENT PLACEMENT Left 11/07/2017   Procedure: CYSTOSCOPY WITH RETROGRADE PYELOGRAM, URETEROSCOPY AND STENT PLACEMENT;  Surgeon: Cleon Gustin, MD;  Location: [facility];  Service: Urology;  Laterality: Left;  . CYSTOSCOPY/URETEROSCOPY/HOLMIUM LASER/STENT PLACEMENT Right 03/11/2017   Procedure: CYSTOSCOPY/URETEROSCOPYSTENT PLACEMENT right ureter retrograde pylegram;  Surgeon: Cleon Gustin, MD;  Location: [facility];  Service: Urology;  Laterality: Right;  . HARDWARE REMOVAL  10/05/2012   Procedure: HARDWARE REMOVAL;  Surgeon: [name], MD;  Location: [facility];  Service: Orthopedics;  Laterality: Right;  REMOVING  STRYKER  GAMMA NAIL  . HARDWARE REMOVAL Right 07/03/2013   Procedure: HARDWARE REMOVAL RIGHT TIBIA ;  Surgeon: Rozanna Box, MD;  Location: [place];  Service: Orthopedics;  Laterality: Right;  . HIP CLOSED REDUCTION Right 10/15/2013   Procedure: CLOSED MANIPULATION HIP;  Surgeon: [name], MD;  Location: [facility];  Service: Orthopedics;  Laterality: Right;  . hip revision  June 2017  . HOLMIUM LASER APPLICATION Right 02/11/6269   Procedure: HOLMIUM LASER APPLICATION;  Surgeon: Cleon Gustin, MD;  Location: [facility];  Service: Urology;  Laterality: Right;  . INCISION AND DRAINAGE HIP Right 11/16/2013   Procedure: IRRIGATION AND DEBRIDEMENT RIGHT HIP;  Surgeon: [name], MD;  Location: [facility];  Service: Orthopedics;  Laterality: Right;  . IR IMAGING GUIDED PORT INSERTION  06/22/2018  . IR URETERAL STENT LEFT NEW ACCESS W/O SEP NEPHROSTOMY CATH  10/24/2017  . JOINT REPLACEMENT     hip-right x2  . KNEE SURGERY Bilateral 2012   Total knee replacements  . NEPHROLITHOTOMY Right 02/08/2017   Procedure: NEPHROLITHOTOMY PERCUTANEOUS WITH SURGEON ACCESS;  Surgeon: Cleon Gustin, MD;   [facility];  Service: Urology;  Laterality: Right;  . NEPHROLITHOTOMY Left 10/24/2017   Procedure: NEPHROLITHOTOMY PERCUTANEOUS;  Surgeon: Cleon Gustin, MD;  [facility];  Service: Urology;  Laterality: Left;  . ORIF TIBIA FRACTURE Right 02/06/2013   Procedure: OPEN REDUCTION INTERNAL FIXATION (ORIF) TIBIA FRACTURE WITH IM ROD FIBULA;  Surgeon: Rozanna Box, MD;  Location: [place];  Service: Orthopedics;  Laterality: Right;  . ORIF TIBIA FRACTURE Right 07/03/2013   Procedure: RIGHT TIBIA NON UNION REPAIR ;  Surgeon: Rozanna Box, MD;  Location: [place];  Service: Orthopedics;  Laterality: Right;  . ORIF WRIST FRACTURE  10/02/2012   Procedure: OPEN REDUCTION INTERNAL FIXATION (ORIF) WRIST FRACTURE;  Surgeon: Roseanne Kaufman, MD;  [facility];  Service: Orthopedics;  Laterality: Right;  WITH   ANTIBIOTIC  CEMENT  . ORIF WRIST FRACTURE Left 10/28/2013   Procedure: OPEN REDUCTION INTERNAL FIXATION (ORIF) WRIST FRACTURE with allograft;  Surgeon: Roseanne Kaufman, MD;  [facility];  Service: Orthopedics;  Laterality: Left;  DVR Plate  . QUADRICEPS TENDON REPAIR Left 07/15/2017   Procedure: REPAIR QUADRICEP TENDON;  Surgeon: Frederik Pear, MD;  Location: [facility];  Service: Orthopedics;  Laterality: Left;  . right femur surgery  05/2012  . THYROIDECTOMY  02/1986  . TOTAL HIP REVISION  10/05/2012   Procedure: TOTAL HIP REVISION;  Surgeon: [name], MD;  [facility];  Service: Orthopedics;  Laterality: Right;  RIGHT TOTAL HIP REVISION  . TOTAL HIP REVISION Right 09/17/2013   Procedure: REVISION RIGHT TOTAL HIP ARTHROPLASTY ;  Surgeon: [name], MD;  [facility];  Service: Orthopedics;  Laterality: Right;  . TOTAL HIP REVISION Right 10/26/2013   Procedure: REVISION RIGHT TOTAL HIP ARTHROPLASTY;  Surgeon: [name], MD;  [facility];  Service: Orthopedics;  Laterality: Right;  . TOTAL HIP REVISION  03/2016  . TOTAL KNEE REVISION Left 04/11/2017   Procedure:  TOTAL KNEE REVISION PATELLA and TIBIA;  Surgeon: Frederik Pear, MD;  Location: [place];  Service: Orthopedics;  Laterality: Left;  . WRIST FRACTURE SURGERY  05/2012    There were no vitals filed for this visit.  Subjective Assessment - 09/12/18 1610    Subjective  Pt is upset today about many things.  He heard about a friend passing away, antoher who had a head on collision, and his partner who has had 2 surgeries in the past month.  Pt is caregiver for him. Pt states he has not done home exercise. but his is moving around alot at home . He is walking up the steps foward with one rail.  He has lost his confident in his balance and legs     Pertinent History  2013 fall  resulting in a femur break on the Rt leg resulting in multiple surgeries due to failures including hip replacements another broken tibia leading to the discovery of osteoporosis and a metabolic bone disease.  After forteo injections x 2 years he is now in the osteopenia category.  Has a new scan coming up in a few weeks.  prior to the cancer diagnosis he was able to be confident with a cane.  Then was diagnosed with diffuse non-hogkin large cell B-lymphoma 07/09/18 located in tonsils and Rt groin. Treatment consists of an infusion every 3 weeks hopefully for a total of 5, A-fib controlled, HTN controlled with medication    Currently in Pain?  Yes    Pain Score  2     Pain Location  Buttocks    Pain Orientation  Right    Pain Descriptors / Indicators  Aching    Pain Type  Chronic pain    Pain Onset  More than a month ago                       [facility] Adult PT Treatment/Exercise - 09/12/18 0001      Exercises   Exercises  Shoulder;Elbow;Lumbar;Knee/Hip;Ankle      Elbow Exercises   Elbow Flexion  Strengthening;Right;Left    Bar Weights/Barbell (Elbow Flexion)  5 lbs    Elbow Flexion Limitations  1 set of 10 in sitting, one set of 10 in supine with each arm       Lumbar Exercises: Standing   Other Standing Lumbar  Exercises  standing with back to walker for 10 reps of yellow ball to wall toss and catch       Lumbar Exercises: Supine   Pelvic Tilt  10 reps    Pelvic Tilt Limitations  10 reps with purple ball under sacrum     Bridge  10 reps      Knee/Hip Exercises: Supine   Short Arc Quad Sets  Strengthening;Right;Left;10 reps    Heel Slides  Strengthening;Right;Left;10 reps      Knee/Hip Exercises: Sidelying   Hip ABduction  AROM;Right;Left;10 reps      Shoulder Exercises: Supine   Protraction  Right;Left;5 reps;Weights    Protraction Weight (lbs)  5    Other Supine Exercises  small circles with hand pointed to ceiling with 5# in hand                   PT Long Term Goals - 08/10/18 1503      PT LONG TERM GOAL #1   Title  Pt will improve sit to stand x 30sec from 21" surface to 8 times     Time  6    Period  Weeks    Status  New    Target Date  09/21/18      PT LONG TERM GOAL #2   Title  Pt will improve [place] balance scale to at least 10/40    Time  6    Period  Weeks    Target Date  09/21/18      PT LONG TERM GOAL #3   Title  Pt will be ind with LRAD for home and community use    Time  6    Period  Weeks    Status  New    Target Date  09/21/18      PT LONG TERM GOAL #4   Title  Pt will perform TUG with LRAD at 28sec or less  Time  6    Period  Weeks    Status  New    Target Date  09/21/18      PT LONG TERM GOAL #5   Title  Pt will report no instances of Rt LE giving out x 5 days at home     Time  6    Period  Weeks    Status  New    Target Date  09/21/18            Plan - 09/12/18 1715    Clinical Impression Statement  Pt is concerned about his lack of balance when he is on his feet and fear of falling.  He is also having difficulty with other issues in his life as well.  He was able to do strengthening today ,but wants to work on more balance activities next visit     Rehab Potential  Good    PT Frequency  2x / week    PT Duration  6 weeks     PT Treatment/Interventions  ADLs/Self Care Home Management;Therapeutic exercise;Patient/family education;Gait training;Stair training;Balance training;Neuromuscular re-education    PT Next Visit Plan  Reassess goals Do standing and balance activties as well as UE strengthening in standing     Consulted and Agree with Plan of Care  Patient       Patient will benefit from skilled therapeutic intervention in order to improve the following deficits and impairments:  Abnormal gait, Decreased activity tolerance, Decreased endurance, Decreased strength, Decreased balance, Difficulty walking  Visit Diagnosis: Other abnormalities of gait and mobility  History of falling  Muscle weakness (generalized)     Problem List Patient Active Problem List   Diagnosis Date Noted  . Counseling regarding advance care planning and goals of care 07/09/2018  . Diffuse large B-cell lymphoma ([place]) 06/23/2018  . Port-A-Cath in place 06/23/2018  . Hypogonadism in male 11/22/2017  . Nephrolithiasis 10/24/2017  . Recurrent dislocation of left patella 07/15/2017  . Recurrent subluxation of patella, left 07/08/2017  . S/P revision of total knee 04/11/2017  . Failed total knee, left, initial encounter ([place]) 04/09/2017  . Renal calculus, right 02/08/2017  . Protein-calorie malnutrition, severe ([place]) 12/08/2016  . Chronic obstructive pulmonary disease ([place]) 12/08/2016  . Staghorn renal calculus 11/05/2016  . Wound drainage right hip 11/15/2013  . Expected blood loss anemia 09/18/2013  . Hyponatremia 09/18/2013  . S/P right TH revision 09/17/2013  . Nonunion, fracture, Right tibia  07/03/2013  . UTI (urinary tract infection) 07/03/2013  . Pathological fracture due to osteoporosis with delayed healing 04/24/2013  . Osteoporosis with fracture 04/24/2013  . Rash and nonspecific skin eruption 03/08/2013  . Syncope 02/21/2013  . PAF (paroxysmal atrial fibrillation) ([place]) 02/21/2013  . Chronic anticoagulation  02/21/2013  . Fall 02/08/2013  . Physical deconditioning 02/08/2013  . Low back pain radiating to both legs 02/08/2013  . Compression fracture of L1 lumbar vertebra (HCC) 02/08/2013  . Right tibial fracture 02/05/2013  . Obesity, unspecified 02/05/2013  . Osteomyelitis of right wrist ([place]) 11/02/2012  . Anemia 10/06/2012  . HTN (hypertension) 10/03/2012  . Psoriasis 10/03/2012  . Dyslipidemia 10/03/2012  . Vitamin D deficiency 10/03/2012  . GERD (gastroesophageal reflux disease) 10/03/2012  . Hyperglycemia 10/03/2012  . Anxiety 10/03/2012  . Depression 10/03/2012  . CAD (coronary artery disease) 10/03/2012  . DJD (degenerative joint disease) 10/03/2012  . Chronic gingivitis 10/03/2012  . Low testosterone, possible hypogonadism 10/01/2012  . Normocytic anemia 09/30/2012  . Right femoral fracture (  [place]) 09/29/2012  . Right forearm fracture 09/29/2012  . Atrial fibrillation ([place]) 09/29/2012  . Hypothyroidism 09/29/2012  . History of recurrent UTIs 09/29/2012   Donato Heinz. Owens Shark PT  Norwood Levo 09/12/2018, 5:18 PM  [place], [place], [address] Phone: [phone]   Fax:  [phone]  Name: TOBIAS AVITABILE MRN: [id number] Date of Birth: October 08, 1946

## 2018-09-14 ENCOUNTER — Encounter: Payer: Self-pay | Admitting: Physical Therapy

## 2018-09-14 ENCOUNTER — Ambulatory Visit: Payer: Medicare Other | Admitting: Physical Therapy

## 2018-09-14 DIAGNOSIS — M6281 Muscle weakness (generalized): Secondary | ICD-10-CM

## 2018-09-14 DIAGNOSIS — R2689 Other abnormalities of gait and mobility: Secondary | ICD-10-CM

## 2018-09-14 DIAGNOSIS — Z9181 History of falling: Secondary | ICD-10-CM

## 2018-09-14 NOTE — Therapy (Signed)
[place], [place], [address] Phone: [phone]   Fax:  [phone]  Physical Therapy Treatment  Patient Details  Name: [name] MRN: [id number] Date of Birth: 1946-08-20 Referring Provider (PT): Dr. Irene Limbo   Encounter Date: 09/14/2018  PT End of Session - 09/14/18 1204    Visit Number  8    Number of Visits  12    Date for PT Re-Evaluation  09/21/18    PT Start Time  1100    PT Stop Time  1145    PT Time Calculation (min)  45 min    Activity Tolerance  Patient tolerated treatment well    Behavior During Therapy  [facility] for tasks assessed/performed       Past Medical History:  Diagnosis Date  . A-fib (HCC)    hx.- sinus rhythm now  . Acid reflux   . AKI (acute kidney injury) (HCC)    mild  . Anxiety   . Arthritis   . Atrial dilatation, bilateral    Severly, enlargement  per ECHO 11/03/16  . BPH (benign prostatic hyperplasia)   . CAD (coronary artery disease)   . Depression   . Dislocation of internal right hip prosthesis, sequela   . Dysrhythmia    hx a fib  . Elevated brain natriuretic peptide (BNP) level   . Environmental allergies   . Fracture of wrist 10/23/13   LEFT  . High cholesterol   . History of Graves' disease   . History of kidney stones    surg. removal 02/2017  . History of syncope   . HLD (hyperlipidemia)   . Hypertension   . Hypothyroidism    thyroid removed  . Left knee injury    cap dislocated  . Low testosterone, possible hypogonadism 10/01/2012  . Lumbago   . Metabolic bone disease   . Multiple falls   . Osteoporosis    Severe  . Plaque psoriasis   . Sleep apnea    cpap machine-settings 17  . Thrombocytopenia ([place])   . Thyroid disease    HX GRAVES DISEASE  . Transfusion history    s/p 12'13 hip surgery  . Unsteady gait   . UTI (urinary tract infection)   . Vertebral compression fracture (HCC)    L1-wears brace    Past Surgical History:   Procedure Laterality Date  . APPENDECTOMY    . CARDIAC CATHETERIZATION  2013  . CATARACT EXTRACTION, BILATERAL    . COLONOSCOPY    . CYSTOSCOPY WITH RETROGRADE PYELOGRAM, URETEROSCOPY AND STENT PLACEMENT Left 11/07/2017   Procedure: CYSTOSCOPY WITH RETROGRADE PYELOGRAM, URETEROSCOPY AND STENT PLACEMENT;  Surgeon: Cleon Gustin, MD;  Location: [facility];  Service: Urology;  Laterality: Left;  . CYSTOSCOPY/URETEROSCOPY/HOLMIUM LASER/STENT PLACEMENT Right 03/11/2017   Procedure: CYSTOSCOPY/URETEROSCOPYSTENT PLACEMENT right ureter retrograde pylegram;  Surgeon: Cleon Gustin, MD;  Location: [facility];  Service: Urology;  Laterality: Right;  . HARDWARE REMOVAL  10/05/2012   Procedure: HARDWARE REMOVAL;  Surgeon: [name], MD;  Location: [facility];  Service: Orthopedics;  Laterality: Right;  REMOVING  STRYKER  GAMMA NAIL  . HARDWARE REMOVAL Right 07/03/2013   Procedure: HARDWARE REMOVAL RIGHT TIBIA ;  Surgeon: Rozanna Box, MD;  Location: [place];  Service: Orthopedics;  Laterality: Right;  . HIP CLOSED REDUCTION Right 10/15/2013   Procedure: CLOSED MANIPULATION HIP;  Surgeon: [name], MD;  Location: [facility];  Service: Orthopedics;  Laterality: Right;  . hip revision  June 2017  . HOLMIUM LASER APPLICATION Right 01/14/6598   Procedure: HOLMIUM LASER APPLICATION;  Surgeon: Cleon Gustin, MD;  Location: [facility];  Service: Urology;  Laterality: Right;  . INCISION AND DRAINAGE HIP Right 11/16/2013   Procedure: IRRIGATION AND DEBRIDEMENT RIGHT HIP;  Surgeon: [name], MD;  Location: [facility];  Service: Orthopedics;  Laterality: Right;  . IR IMAGING GUIDED PORT INSERTION  06/22/2018  . IR URETERAL STENT LEFT NEW ACCESS W/O SEP NEPHROSTOMY CATH  10/24/2017  . JOINT REPLACEMENT     hip-right x2  . KNEE SURGERY Bilateral 2012   Total knee replacements  . NEPHROLITHOTOMY Right 02/08/2017   Procedure: NEPHROLITHOTOMY PERCUTANEOUS WITH SURGEON ACCESS;  Surgeon: Cleon Gustin, MD;   Location[facility];  Service: Urology;  Laterality: Right;  . NEPHROLITHOTOMY Left 10/24/2017   Procedure: NEPHROLITHOTOMY PERCUTANEOUS;  Surgeon: Cleon Gustin, MD;  Location[facility];  Service: Urology;  Laterality: Left;  . ORIF TIBIA FRACTURE Right 02/06/2013   Procedure: OPEN REDUCTION INTERNAL FIXATION (ORIF) TIBIA FRACTURE WITH IM ROD FIBULA;  Surgeon: Rozanna Box, MD;  Location: [facility];  Service: Orthopedics;  Laterality: Right;  . ORIF TIBIA FRACTURE Right 07/03/2013   Procedure: RIGHT TIBIA NON UNION REPAIR ;  Surgeon: Rozanna Box, MD;  Location: [facility];  Service: Orthopedics;  Laterality: Right;  . ORIF WRIST FRACTURE  10/02/2012   Procedure: OPEN REDUCTION INTERNAL FIXATION (ORIF) WRIST FRACTURE;  Surgeon: Roseanne Kaufman, MD;  Location: [facility];  Service: Orthopedics;  Laterality: Right;  WITH   ANTIBIOTIC  CEMENT  . ORIF WRIST FRACTURE Left 10/28/2013   Procedure: OPEN REDUCTION INTERNAL FIXATION (ORIF) WRIST FRACTURE with allograft;  Surgeon: Roseanne Kaufman, MD;  Location: [facility];  Service: Orthopedics;  Laterality: Left;  DVR Plate  . QUADRICEPS TENDON REPAIR Left 07/15/2017   Procedure: REPAIR QUADRICEP TENDON;  Surgeon: Frederik Pear, MD;  Location: [facility];  Service: Orthopedics;  Laterality: Left;  . right femur surgery  05/2012  . THYROIDECTOMY  02/1986  . TOTAL HIP REVISION  10/05/2012   Procedure: TOTAL HIP REVISION;  Surgeon: [name], MD;  Location[facility];  Service: Orthopedics;  Laterality: Right;  RIGHT TOTAL HIP REVISION  . TOTAL HIP REVISION Right 09/17/2013   Procedure: REVISION RIGHT TOTAL HIP ARTHROPLASTY ;  Surgeon: [name], MD;  Location[facility];  Service: Orthopedics;  Laterality: Right;  . TOTAL HIP REVISION Right 10/26/2013   Procedure: REVISION RIGHT TOTAL HIP ARTHROPLASTY;  Surgeon: [name], MD;  Location[facility];  Service: Orthopedics;  Laterality: Right;  . TOTAL HIP REVISION  03/2016  . TOTAL KNEE REVISION Left 04/11/2017   Procedure:  TOTAL KNEE REVISION PATELLA and TIBIA;  Surgeon: Frederik Pear, MD;  Location: [place];  Service: Orthopedics;  Laterality: Left;  . WRIST FRACTURE SURGERY  05/2012    There were no vitals filed for this visit.  Subjective Assessment - 09/14/18 1109    Subjective  better day today     Pertinent History  2013 fall resulting in a femur break on the Rt leg resulting in multiple surgeries due to failures including hip replacements another broken tibia leading to the discovery of osteoporosis and a metabolic bone disease.  After forteo injections x 2 years he is now in the osteopenia category.  Has a new scan coming up in a few weeks.  prior to the cancer diagnosis he was able to be confident with a cane.  Then  was diagnosed with diffuse non-hogkin large cell B-lymphoma 07/09/18 located in tonsils and Rt groin. Treatment consists of an infusion every 3 weeks hopefully for a total of 5, A-fib controlled, HTN controlled with medication    Patient Stated Goals  get my balance back    Currently in Pain?  Yes    Pain Score  4     Pain Location  --   thighs                        [facility] Adult PT Treatment/Exercise - 09/14/18 0001      Exercises   Exercises  Shoulder;Knee/Hip;Ankle      Knee/Hip Exercises: Standing   Lateral Step Up Limitations  attempted lateral step ups with left leg, but pt was unable as he has difficulty bearing full weight on right leg     Other Standing Knee Exercises  standing at wall step back and weight shift onto each leg with dorsiflexion on opposite foot    pt was not able to take a full step backward with right leg    Other Standing Knee Exercises  weight shift to right leg with left foot still on gound for 15 seconds       Knee/Hip Exercises: Seated   Hamstring Curl  Strengthening;Right;Left;10 reps;Limitations    Hamstring Limitations  foot on purple ball and blue theraband around ankle for pull back.       Shoulder Exercises: Seated   Horizontal  ABduction  Strengthening;Right;Left;10 reps;Theraband    Theraband Level (Shoulder Horizontal ABduction)  Level 4 (Blue)    Other Seated Exercises  in sitting, pt catch and throw with yelllow ball for 5 minutes at various sppeds directions and force.  Pt did alot of overhead extension with no balance                   PT Long Term Goals - 08/10/18 1503      PT LONG TERM GOAL #1   Title  Pt will improve sit to stand x 30sec from 21" surface to 8 times     Time  6    Period  Weeks    Status  New    Target Date  09/21/18      PT LONG TERM GOAL #2   Title  Pt will improve [place] balance scale to at least 10/40    Time  6    Period  Weeks    Target Date  09/21/18      PT LONG TERM GOAL #3   Title  Pt will be ind with LRAD for home and community use    Time  6    Period  Weeks    Status  New    Target Date  09/21/18      PT LONG TERM GOAL #4   Title  Pt will perform TUG with LRAD at 28sec or less     Time  6    Period  Weeks    Status  New    Target Date  09/21/18      PT LONG TERM GOAL #5   Title  Pt will report no instances of Rt LE giving out x 5 days at home     Time  6    Period  Weeks    Status  New    Target Date  09/21/18            Plan -  09/14/18 1216    Clinical Impression Statement  Pt did well with exercise today.  Focused on balance work in sitting with upper trunk extension  He was unable to stand on right leg to attempt to do step ups with left leg while attempting ascend stairs.       PT Treatment/Interventions  ADLs/Self Care Home Management;Therapeutic exercise;Patient/family education;Gait training;Stair training;Balance training;Neuromuscular re-education    PT Next Visit Plan  Reassess goals Do standing and balance activties as well as UE strengthening in standing        Patient will benefit from skilled therapeutic intervention in order to improve the following deficits and impairments:  Abnormal gait, Decreased activity  tolerance, Decreased endurance, Decreased strength, Decreased balance, Difficulty walking  Visit Diagnosis: Other abnormalities of gait and mobility  History of falling  Muscle weakness (generalized)     Problem List Patient Active Problem List   Diagnosis Date Noted  . Counseling regarding advance care planning and goals of care 07/09/2018  . Diffuse large B-cell lymphoma ([place]) 06/23/2018  . Port-A-Cath in place 06/23/2018  . Hypogonadism in male 11/22/2017  . Nephrolithiasis 10/24/2017  . Recurrent dislocation of left patella 07/15/2017  . Recurrent subluxation of patella, left 07/08/2017  . S/P revision of total knee 04/11/2017  . Failed total knee, left, initial encounter ([place]) 04/09/2017  . Renal calculus, right 02/08/2017  . Protein-calorie malnutrition, severe ([place]) 12/08/2016  . Chronic obstructive pulmonary disease ([place]) 12/08/2016  . Staghorn renal calculus 11/05/2016  . Wound drainage right hip 11/15/2013  . Expected blood loss anemia 09/18/2013  . Hyponatremia 09/18/2013  . S/P right TH revision 09/17/2013  . Nonunion, fracture, Right tibia  07/03/2013  . UTI (urinary tract infection) 07/03/2013  . Pathological fracture due to osteoporosis with delayed healing 04/24/2013  . Osteoporosis with fracture 04/24/2013  . Rash and nonspecific skin eruption 03/08/2013  . Syncope 02/21/2013  . PAF (paroxysmal atrial fibrillation) ([place]) 02/21/2013  . Chronic anticoagulation 02/21/2013  . Fall 02/08/2013  . Physical deconditioning 02/08/2013  . Low back pain radiating to both legs 02/08/2013  . Compression fracture of L1 lumbar vertebra (HCC) 02/08/2013  . Right tibial fracture 02/05/2013  . Obesity, unspecified 02/05/2013  . Osteomyelitis of right wrist ([place]) 11/02/2012  . Anemia 10/06/2012  . HTN (hypertension) 10/03/2012  . Psoriasis 10/03/2012  . Dyslipidemia 10/03/2012  . Vitamin D deficiency 10/03/2012  . GERD (gastroesophageal reflux disease) 10/03/2012  .  Hyperglycemia 10/03/2012  . Anxiety 10/03/2012  . Depression 10/03/2012  . CAD (coronary artery disease) 10/03/2012  . DJD (degenerative joint disease) 10/03/2012  . Chronic gingivitis 10/03/2012  . Low testosterone, possible hypogonadism 10/01/2012  . Normocytic anemia 09/30/2012  . Right femoral fracture ([place]) 09/29/2012  . Right forearm fracture 09/29/2012  . Atrial fibrillation ([place]) 09/29/2012  . Hypothyroidism 09/29/2012  . History of recurrent UTIs 09/29/2012   Donato Heinz. Owens Shark PT  Norwood Levo 09/14/2018, 12:29 PM  [place], [place], [address] Phone: [phone]   Fax:  [phone]  Name: [name] MRN: [id number] Date of Birth: 10-02-1946

## 2018-09-15 ENCOUNTER — Ambulatory Visit: Payer: Medicare Other

## 2018-09-15 ENCOUNTER — Other Ambulatory Visit: Payer: Medicare Other

## 2018-09-15 ENCOUNTER — Ambulatory Visit: Payer: Medicare Other | Admitting: Hematology

## 2018-09-18 ENCOUNTER — Ambulatory Visit: Payer: Medicare Other

## 2018-09-18 NOTE — Progress Notes (Signed)
HEMATOLOGY/ONCOLOGY CLINIC NOTE  Date of Service: 09/19/2018  Patient Care Team: Plotnikov, Evie Lacks, MD as PCP - General (Internal Medicine)  CHIEF COMPLAINTS/PURPOSE OF CONSULTATION:  Follow Up for Diffuse Large B-Cell Lymphoma   HISTORY OF PRESENTING ILLNESS:   [name] is a wonderful 72 y.o. male who has been referred to [place] by Dr. Lew Dawes for evaluation and management of Diffuse Large B-Cell Lymphoma. He is accompanied today by his partner. The pt reports that he is doing well overall.   The pt reports that he presented to ENT Dr. Pollie Friar care after developing a cough and a new nodule in his mouth. He notes that the symptoms presented about a month ago and have not progressed or significantly worsened. He notes that he has frequent sinus drainage, and felt that this enlargement could initially be related to this. He denies any changes in his voice and difficulty breathing.   The pt notes that he is finishing three months of PT tomorrow after surgeries on his right leg and left knee. He notes that he takes blood thinners for Afib and uses his CPAP regularly now. The pt also receives testosterone injections, 200mg  every other week. The pt notes that he had several surgeries bilaterally to remove large kidney stones in the past. He notes that his thyroid replacement has been stable. His vertebral compression fracture was in 2013, after a fall in which he also broke his right femur and wrist. The pt also takes Prolia every 6 months for his osteoporosis. He has had 18 surgeries on his right leg.   He notes that his psoriasis has been stable and takes Enbrel twice a week, and has taken this for 5 years. He was on 10 years of Humara before this. He was on Methotrexate prior to [facility].   The pt notes that he functions okay at home and is able to drive himself and cook for himself and feels that he can function independently with limitations on lifting heavier objects.   On  review of systems, pt reports recent cough, left tonsil mass, stable energy levels, and denies fevers, chills, night sweats, unexpected weight loss, difficulty breathing, difficulty swallowing, noticing any other lumps or bumps, pain along the spine, recent infections, abdominal pains, difficulty urinating, leg swelling, and any other symptoms.  . On Social Hx the pt reports that he smoked cigarettes for many years until he quit 6 years ago, and drinks ETOH socially.    INTERVAL HISTORY   [name] is here for management, evaluation, and C5D1 R-CHOP treatment of his Diffuse Large B-Cell Lymphoma. The patient's last visit with [place] was on 08/29/18. He is accompanied today by his partner. The pt reports that he is doing well overall.   The pt reports that he has found twice weekly PT visits to be of great benefit and notes that exceptional attention is being placed on balance training.   He notes that he continues to tolerate treatment very well. He notes that he has been eating well and endorses stable weight. He denies any abdominal pains, and has been moving his bowels very well and regularly.   The pt denies any worsening in the numbness in his fingertips, and denies any new neuropathy.   The pt denies any fevers, chills, or concerns for infections.   Lab results today (09/19/18) of CBC w/diff and CMP is as follows: all values are WNL except for RBC at 3.95, HGB at 12.4, HCT at 38.3,  RDW at 19.1, Abs immature granulocytes at 0.10k, Glucose at 166, BUN at 30, Total Protein at 6.2, Albumin at 3.2, GFR at 58. 09/19/18 Magnesium is at 1.7  On review of systems, pt reports increased activity, eating well, moving his bowels well, and denies new neuropathy, worsening neuropathy, fevers, chills, night sweats, concerns of infections, abdominal pains, mouth sores, and any other symptoms.   MEDICAL HISTORY:  Past Medical History:  Diagnosis Date  . A-fib (HCC)    hx.- sinus rhythm now  . Acid  reflux   . AKI (acute kidney injury) (HCC)    mild  . Anxiety   . Arthritis   . Atrial dilatation, bilateral    Severly, enlargement  per ECHO 11/03/16  . BPH (benign prostatic hyperplasia)   . CAD (coronary artery disease)   . Depression   . Dislocation of internal right hip prosthesis, sequela   . Dysrhythmia    hx a fib  . Elevated brain natriuretic peptide (BNP) level   . Environmental allergies   . Fracture of wrist 10/23/13   LEFT  . High cholesterol   . History of Graves' disease   . History of kidney stones    surg. removal 02/2017  . History of syncope   . HLD (hyperlipidemia)   . Hypertension   . Hypothyroidism    thyroid removed  . Left knee injury    cap dislocated  . Low testosterone, possible hypogonadism 10/01/2012  . Lumbago   . Metabolic bone disease   . Multiple falls   . Osteoporosis    Severe  . Plaque psoriasis   . Sleep apnea    cpap machine-settings 17  . Thrombocytopenia ([place])   . Thyroid disease    HX GRAVES DISEASE  . Transfusion history    s/p 12'13 hip surgery  . Unsteady gait   . UTI (urinary tract infection)   . Vertebral compression fracture (HCC)    L1-wears brace    SURGICAL HISTORY: Past Surgical History:  Procedure Laterality Date  . APPENDECTOMY    . CARDIAC CATHETERIZATION  2013  . CATARACT EXTRACTION, BILATERAL    . COLONOSCOPY    . CYSTOSCOPY WITH RETROGRADE PYELOGRAM, URETEROSCOPY AND STENT PLACEMENT Left 11/07/2017   Procedure: CYSTOSCOPY WITH RETROGRADE PYELOGRAM, URETEROSCOPY AND STENT PLACEMENT;  Surgeon: Cleon Gustin, MD;  Location: [facility];  Service: Urology;  Laterality: Left;  . CYSTOSCOPY/URETEROSCOPY/HOLMIUM LASER/STENT PLACEMENT Right 03/11/2017   Procedure: CYSTOSCOPY/URETEROSCOPYSTENT PLACEMENT right ureter retrograde pylegram;  Surgeon: Cleon Gustin, MD;  [facility]: [facility];  Service: Urology;  Laterality: Right;  . HARDWARE REMOVAL  10/05/2012   Procedure: HARDWARE REMOVAL;  Surgeon: [name], MD;  [facility];  Service: Orthopedics;  Laterality: Right;  REMOVING  STRYKER  GAMMA NAIL  . HARDWARE REMOVAL Right 07/03/2013   Procedure: HARDWARE REMOVAL RIGHT TIBIA ;  Surgeon: Rozanna Box, MD;  Location: [place];  Service: Orthopedics;  Laterality: Right;  . HIP CLOSED REDUCTION Right 10/15/2013   Procedure: CLOSED MANIPULATION HIP;  Surgeon: [name], MD;  [facility];  Service: Orthopedics;  Laterality: Right;  . hip revision     June 2017  . HOLMIUM LASER APPLICATION Right 01/12/101   Procedure: HOLMIUM LASER APPLICATION;  Surgeon: Cleon Gustin, MD;  [facility];  Service: Urology;  Laterality: Right;  . INCISION AND DRAINAGE HIP Right 11/16/2013   Procedure: IRRIGATION AND DEBRIDEMENT RIGHT HIP;  Surgeon: [name], MD;  Location: [facility];  Service: Orthopedics;  Laterality: Right;  . IR IMAGING GUIDED PORT INSERTION  06/22/2018  . IR URETERAL STENT LEFT NEW ACCESS W/O SEP NEPHROSTOMY CATH  10/24/2017  . JOINT REPLACEMENT     hip-right x2  . KNEE SURGERY Bilateral 2012   Total knee replacements  . NEPHROLITHOTOMY Right 02/08/2017   Procedure: NEPHROLITHOTOMY PERCUTANEOUS WITH SURGEON ACCESS;  Surgeon: Cleon Gustin, MD;  [facility];  Service: Urology;  Laterality: Right;  . NEPHROLITHOTOMY Left 10/24/2017   Procedure: NEPHROLITHOTOMY PERCUTANEOUS;  Surgeon: Cleon Gustin, MD;  [facility];  Service: Urology;  Laterality: Left;  . ORIF TIBIA FRACTURE Right 02/06/2013   Procedure: OPEN REDUCTION INTERNAL FIXATION (ORIF) TIBIA FRACTURE WITH IM ROD FIBULA;  Surgeon: Rozanna Box, MD;  Location: [place];  Service: Orthopedics;  Laterality: Right;  . ORIF TIBIA FRACTURE Right 07/03/2013   Procedure: RIGHT TIBIA NON UNION REPAIR ;  Surgeon: Rozanna Box, MD;  Location: [place];  Service: Orthopedics;  Laterality: Right;  . ORIF WRIST FRACTURE  10/02/2012   Procedure: OPEN REDUCTION INTERNAL FIXATION (ORIF) WRIST FRACTURE;  Surgeon:  Roseanne Kaufman, MD;  [facility];  Service: Orthopedics;  Laterality: Right;  WITH   ANTIBIOTIC  CEMENT  . ORIF WRIST FRACTURE Left 10/28/2013   Procedure: OPEN REDUCTION INTERNAL FIXATION (ORIF) WRIST FRACTURE with allograft;  Surgeon: Roseanne Kaufman, MD;  [facility];  Service: Orthopedics;  Laterality: Left;  DVR Plate  . QUADRICEPS TENDON REPAIR Left 07/15/2017   Procedure: REPAIR QUADRICEP TENDON;  Surgeon: Frederik Pear, MD;  Location: [place];  Service: Orthopedics;  Laterality: Left;  . right femur surgery  05/2012  . THYROIDECTOMY  02/1986  . TOTAL HIP REVISION  10/05/2012   Procedure: TOTAL HIP REVISION;  Surgeon: [name], MD;  [facility];  Service: Orthopedics;  Laterality: Right;  RIGHT TOTAL HIP REVISION  . TOTAL HIP REVISION Right 09/17/2013   Procedure: REVISION RIGHT TOTAL HIP ARTHROPLASTY ;  Surgeon: [name], MD;  [facility];  Service: Orthopedics;  Laterality: Right;  . TOTAL HIP REVISION Right 10/26/2013   Procedure: REVISION RIGHT TOTAL HIP ARTHROPLASTY;  Surgeon: [name], MD;  [facility];  Service: Orthopedics;  Laterality: Right;  . TOTAL HIP REVISION  03/2016  . TOTAL KNEE REVISION Left 04/11/2017   Procedure: TOTAL KNEE REVISION PATELLA and TIBIA;  Surgeon: Frederik Pear, MD;  Location: [place];  Service: Orthopedics;  Laterality: Left;  . WRIST FRACTURE SURGERY  05/2012    SOCIAL HISTORY: Social History   Socioeconomic History  . Marital status: Married    Spouse name: Not on file  . Number of children: Not on file  . Years of education: Not on file  . Highest education level: Not on file  Occupational History  . Not on file  Social Needs  . Financial resource strain: Not on file  . Food insecurity:    Worry: Not on file    Inability: Not on file  . Transportation needs:    Medical: Not on file    Non-medical: Not on file  Tobacco Use  . Smoking status: Former Smoker    Last attempt to quit: 06/05/2012    Years since  quitting: 6.2  . Smokeless tobacco: Never Used  Substance and Sexual Activity  . Alcohol use: Yes    Comment: occasional-social  . Drug use: No  . Sexual activity: Yes  Lifestyle  . Physical activity:  Days per week: Not on file    Minutes per session: Not on file  . Stress: Not on file  Relationships  . Social connections:    Talks on phone: Not on file    Gets together: Not on file    Attends religious service: Not on file    Active member of club or organization: Not on file    Attends meetings of clubs or organizations: Not on file    Relationship status: Not on file  . Intimate partner violence:    Fear of current or ex partner: Not on file    Emotionally abused: Not on file    Physically abused: Not on file    Forced sexual activity: Not on file  Other Topics Concern  . Not on file  Social History Narrative   [place] 2.5 months, went home Feb 21st slipped and fell on back and developed.  Home PT/OT.  Just started outpatient physical therapy.  Friday night, misstepped.      FAMILY HISTORY: Family History  Problem Relation Age of Onset  . CAD Father 56  . Asthma Father   . Alcohol abuse Father   . Arthritis Mother   . Alcohol abuse Sister     ALLERGIES:  is allergic to short ragweed pollen ext.  MEDICATIONS:  Current Outpatient Medications  Medication Sig Dispense Refill  . alfuzosin (UROXATRAL) 10 MG 24 hr tablet Take 10 mg by mouth at bedtime.    [name] amoxicillin (AMOXIL) 500 MG capsule Take 2,000 mg by mouth See admin instructions. Takes 4 capsules 1 hour prior to dental procedures    . atorvastatin (LIPITOR) 80 MG tablet Take 80 mg by mouth daily.    . BELSOMRA 15 MG TABS Take 1 tablet by mouth daily. 30 tablet 3  . buPROPion (WELLBUTRIN XL) 300 MG 24 hr tablet TAKE 1 TABLET DAILY WITH  BREAKFAST BY MOUTH. (Patient taking differently: Take 300 mg by mouth daily. ) 90 tablet 3  . Calcium Citrate-Vitamin D (CALCIUM CITRATE + D PO) Take 1 tablet by mouth 2 (two)  times daily.     . Cholecalciferol (VITAMIN D-3 PO) Take 2,000 Units by mouth 2 (two) times daily.     . clonazePAM (KLONOPIN) 1 MG tablet Take 1 tablet (1 mg total) by mouth every 6 (six) hours as needed for anxiety. TAKE 1 TABLET BY MOUTH  EVERY 6 HOURS prn 360 tablet 1  . denosumab (PROLIA) 60 MG/ML SOLN injection Inject 60 mg into the skin every 6 (six) months. Administer in upper arm, thigh, or abdomen    . diltiazem (CARDIZEM CD) 240 MG 24 hr capsule TAKE 1 CAPSULE BY MOUTH  DAILY 90 capsule 3  . doxycycline (VIBRA-TABS) 100 MG tablet TAKE 1 TABLET BY MOUTH  DAILY 90 tablet 1  . DULoxetine (CYMBALTA) 60 MG capsule TAKE 1 CAPSULE BY MOUTH  TWICE DAILY 180 capsule 1  . EPINEPHrine (EPIPEN 2-PAK) 0.3 mg/0.3 mL IJ SOAJ injection Inject 0.3 mg into the muscle daily as needed (for anaphylactic reaction).    [name] etanercept (ENBREL) 50 MG/ML injection Inject 50 mg into the skin 2 (two) times a week. Tuesday & Friday    . ezetimibe (ZETIA) 10 MG tablet Take 1 tablet (10 mg total) by mouth daily. 90 tablet 3  . ferrous sulfate 325 (65 FE) MG tablet Take 325 mg by mouth 2 (two) times daily with a meal.    . HYDROcodone-acetaminophen (NORCO) 7.5-325 MG tablet Take 1 tablet by  mouth 4 (four) times daily as needed for moderate pain. Please fill on 11/02/18 120 tablet 0  . ipratropium (ATROVENT) 0.03 % nasal spray Place 1 spray into the nose daily as needed.    [name] levothyroxine (SYNTHROID, LEVOTHROID) 200 MCG tablet TAKE 1 TABLET DAILY AT 6 AM BY MOUTH. (Patient taking differently: Take 225 mcg by mouth daily before breakfast. ) 90 tablet 3  . levothyroxine (SYNTHROID, LEVOTHROID) 25 MCG tablet TAKE 1 TABLET BY MOUTH  DAILY BEFORE BREAKFAST. (Patient taking differently: Take 225 mcg by mouth daily before breakfast. ) 90 tablet 3  . lidocaine-prilocaine (EMLA) cream Apply to affected area once 30 g 3  . loratadine (CLARITIN) 10 MG tablet Take 10 mg by mouth daily.     [name] LORazepam (ATIVAN) 0.5 MG tablet Take 1  tablet (0.5 mg total) by mouth every 6 (six) hours as needed (Nausea or vomiting). 30 tablet 0  . metoprolol tartrate (LOPRESSOR) 25 MG tablet Take 1 tablet (25 mg total) by mouth 2 (two) times daily. 60 tablet 3  . mirabegron ER (MYRBETRIQ) 25 MG TB24 tablet Take 25 mg by mouth at bedtime.    . Multiple Vitamin (MULTIVITAMIN WITH MINERALS) TABS tablet Take 1 tablet by mouth daily. Men's One-A-Day 92+    . nitroGLYCERIN (NITROSTAT) 0.4 MG SL tablet Place 0.4 mg under the tongue every 5 (five) minutes as needed for chest pain. x3 doses as needed for chest pain    . omeprazole (PRILOSEC) 40 MG capsule TAKE 1 CAPSULE DAILY BEFORE BREAKFAST BY MOUTH. (Patient taking differently: Take 40 mg by mouth daily. ) 90 capsule 3  . ondansetron (ZOFRAN) 8 MG tablet Take 1 tablet (8 mg total) by mouth 2 (two) times daily as needed for refractory nausea / vomiting. chemotherapy. 30 tablet 1  . predniSONE (DELTASONE) 20 MG tablet Take 3 tablets (60 mg total) by mouth daily. Take on days 1-5 of chemotherapy. 50 tablet 0  . PROAIR HFA 108 (90 Base) MCG/ACT inhaler Inhale 1 puff into the lungs every 4 (four) hours as needed.    . Probiotic Product (PROBIOTIC PO) Take 1 capsule by mouth daily with breakfast.    . prochlorperazine (COMPAZINE) 10 MG tablet Take 1 tablet (10 mg total) by mouth every 6 (six) hours as needed (Nausea or vomiting). 30 tablet 6  . testosterone enanthate (DELATESTRYL) 200 MG/ML injection INJECT 1ML (200MG  TOTAL) INTO THE MUSCLE EVERY 14 DAYS. FOR INTRAMUSCULAR USE ONLY. DISCARD VIAL AFTER 28 DAYS. 5 mL 4  . tiZANidine (ZANAFLEX) 4 MG tablet TAKE 1 TABLET BY MOUTH  EVERY 6 HOURS AS NEEDED FOR MUSCLE SPASM(S) 360 tablet 1  . triamcinolone ointment (KENALOG) 0.5 % APPLY TO AFFECTED AREA TWICE A DAY. 30 g 0  . vitamin C (ASCORBIC ACID) 500 MG tablet Take 500 mg by mouth daily.    Alveda Reasons 10 MG TABS tablet TAKE 1 TABLET DAILY BY  MOUTH. 90 tablet 3   No current facility-administered medications for  this visit.     REVIEW OF SYSTEMS:    A 10+ POINT REVIEW OF SYSTEMS WAS OBTAINED including neurology, dermatology, psychiatry, cardiac, respiratory, lymph, extremities, GI, GU, Musculoskeletal, constitutional, breasts, reproductive, HEENT.  All pertinent positives are noted in the HPI.  All others are negative.   PHYSICAL EXAMINATION: ECOG PERFORMANCE STATUS: 2 - Symptomatic, <50% confined to bed  . Vitals:   09/19/18 1138  BP: 133/67  Pulse: 83  Resp: 18  Temp: 97.7 F (36.5 C)  SpO2: 98%  There were no vitals filed for this visit. .Body mass index is 36.62 kg/m.  GENERAL:alert, in no acute distress and comfortable SKIN: chronic venous stasis changes in BLE, psoriasis rashes on elbows EYES: conjunctiva are pink and non-injected, sclera anicteric OROPHARYNX: MMM, no exudates, no oropharyngeal erythema or ulceration NECK: supple, no JVD LYMPH:  Couple palpable enlarged cervical lymph nodes b/l, no palpable lymphadenopathy in the axillary or inguinal regions LUNGS: clear to auscultation b/l with normal respiratory effort HEART: regular rate & rhythm ABDOMEN:  normoactive bowel sounds , non tender, not distended. No palpable hepatosplenomegaly.  Extremity: no pedal edema PSYCH: alert & oriented x 3 with fluent speech NEURO: no focal motor/sensory deficits   LABORATORY DATA:  I have reviewed the data as listed  . CBC Latest Ref Rng & Units 09/19/2018 08/29/2018 08/04/2018  WBC 4.0 - 10.5 K/uL 7.9 6.6 6.9  Hemoglobin 13.0 - 17.0 g/dL 12.4(L) 13.5 14.5  Hematocrit 39.0 - 52.0 % 38.3(L) 41.1 43.6  Platelets 150 - 400 K/uL 155 181 160    . CMP Latest Ref Rng & Units 09/19/2018 08/29/2018 08/04/2018  Glucose 70 - 99 mg/dL 166(H) 132(H) 123(H)  BUN 8 - 23 mg/dL 30(H) 26(H) 31(H)  Creatinine 0.61 - 1.24 mg/dL 1.23 1.41(H) 1.18  Sodium 135 - 145 mmol/L 143 140 141  Potassium 3.5 - 5.1 mmol/L 4.9 4.7 4.6  Chloride 98 - 111 mmol/L 108 104 106  CO2 22 - 32 mmol/L 27 25 26     Calcium 8.9 - 10.3 mg/dL 9.2 9.5 9.4  Total Protein 6.5 - 8.1 g/dL 6.2(L) 6.5 6.6  Total Bilirubin 0.3 - 1.2 mg/dL 0.5 0.6 0.5  Alkaline Phos 38 - 126 U/L 58 61 67  AST 15 - 41 U/L 20 38 26  ALT 0 - 44 U/L 25 50(H) 41   05/29/18 Left tonsil biopsy:   RADIOGRAPHIC STUDIES: I have personally reviewed the radiological images as listed and agreed with the findings in the report. Nm Pet Image Restag (ps) Skull Base To Thigh  Result Date: 08/22/2018 CLINICAL DATA:  Subsequent treatment strategy for diffuse large B-cell lymphoma. EXAM: NUCLEAR MEDICINE PET SKULL BASE TO THIGH TECHNIQUE: 15.1 mCi F-18 FDG was injected intravenously. Full-ring PET imaging was performed from the skull base to thigh after the radiotracer. CT data was obtained and used for attenuation correction and anatomic localization. Fasting blood glucose: 93 mg/dl COMPARISON:  06/16/2018 FINDINGS: Mediastinal blood pool activity: SUV max 2.51 Liver activity: SUV max 3.96. NECK: The left palatine tonsillar mass has decreased in size in the interval. SUV max is equal to 3.91. Previously 19.8. Decreased FDG uptake associated with the right tonsillar region with SUV max of 3.58. Previously 9.8. Resolution of previous hypermetabolic left level 2 lymph nodes. Previously noted large lymph node superficial to the right sternocleidomastoid muscle 2.2 cm short axis and has an SUV max of 2.4. Previously this measured 2.3 cm within SUV max of 2.9. Incidental CT findings: none CHEST: No hypermetabolic axillary, supraclavicular, mediastinal, or hilar lymph nodes. No pleural effusions.  No hypermetabolic pulmonary nodules. Incidental CT findings: Aortic atherosclerosis. Calcifications in the LAD, left circumflex and RCA coronary artery noted. ABDOMEN/PELVIS: There is no abnormal radiotracer uptake identified within the liver, pancreas, spleen, or adrenal glands. No hypermetabolic abdominal lymph nodes. Index right inguinal lymph node has resolved in the  interval. The previous index left inguinal lymph node has also resolved in the interval. Incidental CT findings: Aortic atherosclerosis. No aneurysm. Bilateral nonobstructing renal calculi. Fat containing  umbilical hernia noted. SKELETON: Again noted is a right hip arthroplasty device with cerclage wires and exuberant bone along the proximal stem. Increased radiotracer uptake within this area is again noted within SUV max of 12.25. Incidental CT findings: Vertebra plana deformity involving L1. Chronic right rib fractures. IMPRESSION: 1. Interval response to therapy. Overall Deauville criteria 3. Interval decrease in size and FDG uptake associated with palatine tonsil lesions. There has been resolution of previous left level-II and bilateral inguinal lymph nodes. 2. Enlarged subcutaneous lesion superficial to the lower right sternocleidomastoid muscle exhibits decreased FDG uptake. Currently Deauville criteria 2. 3. No new or progressive sites of disease identified. 4.  Aortic Atherosclerosis (ICD10-I70.0). 5. Multi vessel coronary artery atherosclerotic calcifications. 6. Unchanged vertebra plana deformity. 7. Kidney stones. Electronically Signed   By: Kerby Moors M.D.   On: 08/22/2018 10:14    ASSESSMENT & PLAN:  72 y.o. male with  1. Diffuse Large B-Cell Lymphoma,- Stage III Left tonsil -05/29/18 Left tonsil biopsy which revealed Diffuse Large B-Cell Lymphoma, which requires treatment  -06/07/18 LDH at 214, will monitor change through treatment.  -Baseline ECHO 06/14/18: EF 50-55% -Standard regimen of R-CHOP with G-CSF support for 6 cycles starting 06/23/18  06/16/18 PET/CT revealed 1. Deauville 5 hypermetabolic lesions in the palatine tonsils, left internal jugular chain, and involving a right inguinal lymph node. Deauville 4 involvement of a left inguinal lymph node and an enlarged subcutaneous lesion favoring lymph node superficial to the lower right sternocleidomastoid muscle in the neck. 2. Other  imaging findings of potential clinical significance: Aortic Atherosclerosis. Coronary atherosclerosis. Emphysema. Bilateral nonobstructive nephrolithiasis. Umbilical hernia contains adipose tissues. Old right rib fractures some of which are nonunited. 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   08/22/18 PET/CT revealed Interval response to therapy. Overall Deauville criteria 3. Interval decrease in size and FDG uptake associated with palatine tonsil lesions. There has been resolution of previous left level-II and bilateral inguinal lymph nodes. 2. Enlarged subcutaneous lesion superficial to the lower right sternocleidomastoid muscle exhibits decreased FDG uptake. Currently Deauville criteria 2. 3. No new or progressive sites of disease identified. 4.  Aortic Atherosclerosis. 5. Multi vessel coronary artery atherosclerotic calcifications. 6. Unchanged vertebra plana deformity. 7. Kidney stones.   PLAN:  -Discussed pt labwork today, 09/19/18; ANC normal at 5.5k, mild anemia with HGB at 12.4, PLT normal at 155k -The pt has no prohibitive toxicities from continuing C5 R-CHOP at this time.   -Continue with Physical Therapy, this has been very helpful for the patient  -Recommended salt and baking soda mouthwashes 3-4 times each day -Will continue to monitor mild neuropathy in fingertips -Recommend Claritin and Tylenol for mild bone pains related to neulasta, pt will let me know if he develops worsening bone pains  -Pt holding Enbrel and testosterone. I previously prescribed Kenalog for topical use for now. If needed he may restart Testosterone at low dose. Will monitor.  -Will see the pt back in 3 weeks with C6, sooner if any new concerns   2.  Patient Active Problem List   Diagnosis Date Noted  . Counseling regarding advance care planning and goals of care 07/09/2018  . Diffuse large B-cell lymphoma ([place]) 06/23/2018  . Port-A-Cath in place 06/23/2018  . Hypogonadism in male 11/22/2017  . Nephrolithiasis 10/24/2017  .  Recurrent dislocation of left patella 07/15/2017  . Recurrent subluxation of patella, left 07/08/2017  . S/P revision of total knee 04/11/2017  . Failed total knee, left, initial encounter ([place]) 04/09/2017  . Renal calculus, right  02/08/2017  . Protein-calorie malnutrition, severe ([place]) 12/08/2016  . Chronic obstructive pulmonary disease ([place]) 12/08/2016  . Staghorn renal calculus 11/05/2016  . Wound drainage right hip 11/15/2013  . Expected blood loss anemia 09/18/2013  . Hyponatremia 09/18/2013  . S/P right TH revision 09/17/2013  . Nonunion, fracture, Right tibia  07/03/2013  . UTI (urinary tract infection) 07/03/2013  . Pathological fracture due to osteoporosis with delayed healing 04/24/2013  . Osteoporosis with fracture 04/24/2013  . Rash and nonspecific skin eruption 03/08/2013  . Syncope 02/21/2013  . PAF (paroxysmal atrial fibrillation) ([place]) 02/21/2013  . Chronic anticoagulation 02/21/2013  . Fall 02/08/2013  . Physical deconditioning 02/08/2013  . Low back pain radiating to both legs 02/08/2013  . Compression fracture of L1 lumbar vertebra (HCC) 02/08/2013  . Right tibial fracture 02/05/2013  . Obesity, unspecified 02/05/2013  . Osteomyelitis of right wrist ([place]) 11/02/2012  . Anemia 10/06/2012  . HTN (hypertension) 10/03/2012  . Psoriasis 10/03/2012  . Dyslipidemia 10/03/2012  . Vitamin D deficiency 10/03/2012  . GERD (gastroesophageal reflux disease) 10/03/2012  . Hyperglycemia 10/03/2012  . Anxiety 10/03/2012  . Depression 10/03/2012  . CAD (coronary artery disease) 10/03/2012  . DJD (degenerative joint disease) 10/03/2012  . Chronic gingivitis 10/03/2012  . Low testosterone, possible hypogonadism 10/01/2012  . Normocytic anemia 09/30/2012  . Right femoral fracture ([place]) 09/29/2012  . Right forearm fracture 09/29/2012  . Atrial fibrillation ([place]) 09/29/2012  . Hypothyroidism 09/29/2012  . History of recurrent UTIs 09/29/2012   -continue mx of medical  co-morbidities with PCP   F/u as per scheduled appointments on 10/12/2017 for C6 of R CHOP, labs and MD visit   All of the patients questions were answered with apparent satisfaction. The patient knows to call the clinic with any problems, questions or concerns.  The total time spent in the appt was 27 minutes and more than 50% was on counseling and direct patient cares.    Sullivan Lone MD MS AAHIVMS [facility] Hematology/Oncology Physician [facility]  (Office):       [phone] (Work cell):  [phone] (Fax):           [phone]  09/19/2018 12:16 PM  I, Baldwin Jamaica, am acting as a scribe for Dr. Sullivan Lone.   I, Baldwin Jamaica, am acting as a scribe for Dr. Sullivan Lone.   .I have reviewed the above documentation for accuracy and completeness, and I agree with the above. Brunetta Genera MD

## 2018-09-19 ENCOUNTER — Inpatient Hospital Stay: Payer: Medicare Other | Attending: Hematology

## 2018-09-19 ENCOUNTER — Inpatient Hospital Stay: Payer: Medicare Other

## 2018-09-19 ENCOUNTER — Inpatient Hospital Stay (HOSPITAL_BASED_OUTPATIENT_CLINIC_OR_DEPARTMENT_OTHER): Payer: Medicare Other | Admitting: Hematology

## 2018-09-19 VITALS — BP 133/67 | HR 83 | Temp 97.7°F | Resp 18 | Ht 75.0 in

## 2018-09-19 VITALS — BP 123/80 | HR 85 | Temp 97.8°F | Resp 18

## 2018-09-19 DIAGNOSIS — Z5111 Encounter for antineoplastic chemotherapy: Secondary | ICD-10-CM

## 2018-09-19 DIAGNOSIS — Z5189 Encounter for other specified aftercare: Secondary | ICD-10-CM | POA: Insufficient documentation

## 2018-09-19 DIAGNOSIS — C8330 Diffuse large B-cell lymphoma, unspecified site: Secondary | ICD-10-CM

## 2018-09-19 DIAGNOSIS — C8339 Diffuse large B-cell lymphoma, extranodal and solid organ sites: Secondary | ICD-10-CM

## 2018-09-19 DIAGNOSIS — Z5112 Encounter for antineoplastic immunotherapy: Secondary | ICD-10-CM | POA: Insufficient documentation

## 2018-09-19 DIAGNOSIS — Z7189 Other specified counseling: Secondary | ICD-10-CM

## 2018-09-19 LAB — CBC WITH DIFFERENTIAL/PLATELET
Abs Immature Granulocytes: 0.1 10*3/uL — ABNORMAL HIGH (ref 0.00–0.07)
Basophils Absolute: 0.1 10*3/uL (ref 0.0–0.1)
Basophils Relative: 1 %
Eosinophils Absolute: 0.1 10*3/uL (ref 0.0–0.5)
Eosinophils Relative: 1 %
HCT: 38.3 % — ABNORMAL LOW (ref 39.0–52.0)
Hemoglobin: 12.4 g/dL — ABNORMAL LOW (ref 13.0–17.0)
Immature Granulocytes: 1 %
LYMPHS ABS: 1.4 10*3/uL (ref 0.7–4.0)
Lymphocytes Relative: 18 %
MCH: 31.4 pg (ref 26.0–34.0)
MCHC: 32.4 g/dL (ref 30.0–36.0)
MCV: 97 fL (ref 80.0–100.0)
Monocytes Absolute: 0.7 10*3/uL (ref 0.1–1.0)
Monocytes Relative: 9 %
NRBC: 0 % (ref 0.0–0.2)
Neutro Abs: 5.5 10*3/uL (ref 1.7–7.7)
Neutrophils Relative %: 70 %
Platelets: 155 10*3/uL (ref 150–400)
RBC: 3.95 MIL/uL — ABNORMAL LOW (ref 4.22–5.81)
RDW: 19.1 % — ABNORMAL HIGH (ref 11.5–15.5)
WBC: 7.9 10*3/uL (ref 4.0–10.5)

## 2018-09-19 LAB — MAGNESIUM: Magnesium: 1.7 mg/dL (ref 1.7–2.4)

## 2018-09-19 LAB — CMP (CANCER CENTER ONLY)
ALK PHOS: 58 U/L (ref 38–126)
ALT: 25 U/L (ref 0–44)
AST: 20 U/L (ref 15–41)
Albumin: 3.2 g/dL — ABNORMAL LOW (ref 3.5–5.0)
Anion gap: 8 (ref 5–15)
BUN: 30 mg/dL — ABNORMAL HIGH (ref 8–23)
CO2: 27 mmol/L (ref 22–32)
CREATININE: 1.23 mg/dL (ref 0.61–1.24)
Calcium: 9.2 mg/dL (ref 8.9–10.3)
Chloride: 108 mmol/L (ref 98–111)
GFR, Est AFR Am: >60 mL/min (ref >60)
GFR, Estimated: 58 mL/min — ABNORMAL LOW (ref >60)
Glucose, Bld: 166 mg/dL — ABNORMAL HIGH (ref 70–99)
Potassium: 4.9 mmol/L (ref 3.5–5.1)
Sodium: 143 mmol/L (ref 135–145)
Total Bilirubin: 0.5 mg/dL (ref 0.3–1.2)
Total Protein: 6.2 g/dL — ABNORMAL LOW (ref 6.5–8.1)

## 2018-09-19 MED ORDER — SODIUM CHLORIDE 0.9 % IV SOLN
740.0000 mg/m2 | Freq: Once | INTRAVENOUS | Status: AC
  Administered 2018-09-19: 2000 mg via INTRAVENOUS
  Filled 2018-09-19: qty 100

## 2018-09-19 MED ORDER — HEPARIN SOD (PORK) LOCK FLUSH 100 UNIT/ML IV SOLN
500.0000 [IU] | Freq: Once | INTRAVENOUS | Status: AC | PRN
  Administered 2018-09-19: 500 [IU]
  Filled 2018-09-19: qty 5

## 2018-09-19 MED ORDER — SODIUM CHLORIDE 0.9% FLUSH
10.0000 mL | INTRAVENOUS | Status: DC | PRN
  Administered 2018-09-19: 10 mL
  Filled 2018-09-19: qty 10

## 2018-09-19 MED ORDER — DIPHENHYDRAMINE HCL 25 MG PO CAPS
ORAL_CAPSULE | ORAL | Status: AC
  Filled 2018-09-19: qty 2

## 2018-09-19 MED ORDER — DEXAMETHASONE SODIUM PHOSPHATE 10 MG/ML IJ SOLN
10.0000 mg | Freq: Once | INTRAMUSCULAR | Status: AC
  Administered 2018-09-19: 10 mg via INTRAVENOUS

## 2018-09-19 MED ORDER — PALONOSETRON HCL INJECTION 0.25 MG/5ML
0.2500 mg | Freq: Once | INTRAVENOUS | Status: AC
  Administered 2018-09-19: 0.25 mg via INTRAVENOUS

## 2018-09-19 MED ORDER — ACETAMINOPHEN 325 MG PO TABS
ORAL_TABLET | ORAL | Status: AC
  Filled 2018-09-19: qty 2

## 2018-09-19 MED ORDER — SODIUM CHLORIDE 0.9 % IV SOLN
375.0000 mg/m2 | Freq: Once | INTRAVENOUS | Status: AC
  Administered 2018-09-19: 1000 mg via INTRAVENOUS
  Filled 2018-09-19: qty 100

## 2018-09-19 MED ORDER — DIPHENHYDRAMINE HCL 25 MG PO CAPS
50.0000 mg | ORAL_CAPSULE | Freq: Once | ORAL | Status: AC
  Administered 2018-09-19: 50 mg via ORAL

## 2018-09-19 MED ORDER — DOXORUBICIN HCL CHEMO IV INJECTION 2 MG/ML
50.0000 mg/m2 | Freq: Once | INTRAVENOUS | Status: AC
  Administered 2018-09-19: 134 mg via INTRAVENOUS
  Filled 2018-09-19: qty 67

## 2018-09-19 MED ORDER — PALONOSETRON HCL INJECTION 0.25 MG/5ML
INTRAVENOUS | Status: AC
  Filled 2018-09-19: qty 5

## 2018-09-19 MED ORDER — DEXAMETHASONE SODIUM PHOSPHATE 10 MG/ML IJ SOLN
INTRAMUSCULAR | Status: AC
  Filled 2018-09-19: qty 1

## 2018-09-19 MED ORDER — VINCRISTINE SULFATE CHEMO INJECTION 1 MG/ML
2.0000 mg | Freq: Once | INTRAVENOUS | Status: AC
  Administered 2018-09-19: 2 mg via INTRAVENOUS
  Filled 2018-09-19: qty 2

## 2018-09-19 MED ORDER — ACETAMINOPHEN 325 MG PO TABS
650.0000 mg | ORAL_TABLET | Freq: Once | ORAL | Status: AC
  Administered 2018-09-19: 650 mg via ORAL

## 2018-09-19 MED ORDER — SODIUM CHLORIDE 0.9 % IV SOLN
Freq: Once | INTRAVENOUS | Status: AC
  Administered 2018-09-19: 13:00:00 via INTRAVENOUS
  Filled 2018-09-19: qty 250

## 2018-09-19 NOTE — Patient Instructions (Signed)
[facility] Discharge Instructions for Patients Receiving Chemotherapy  Today you received the following chemotherapy agents Rituxan, Cytoxan, Velcade, and Adriamycin.  To help prevent nausea and vomiting after your treatment, we encourage you to take your nausea medication as directed.  If you develop nausea and vomiting that is not controlled by your nausea medication, call the clinic.   BELOW ARE SYMPTOMS THAT SHOULD BE REPORTED IMMEDIATELY:  *FEVER GREATER THAN 100.5 F  *CHILLS WITH OR WITHOUT FEVER  NAUSEA AND VOMITING THAT IS NOT CONTROLLED WITH YOUR NAUSEA MEDICATION  *UNUSUAL SHORTNESS OF BREATH  *UNUSUAL BRUISING OR BLEEDING  TENDERNESS IN MOUTH AND THROAT WITH OR WITHOUT PRESENCE OF ULCERS  *URINARY PROBLEMS  *BOWEL PROBLEMS  UNUSUAL RASH Items with * indicate a potential emergency and should be followed up as soon as possible.  Feel free to call the clinic should you have any questions or concerns. The clinic phone number is [phone].  Please show the [place] at check-in to the Emergency Department and triage nurse.

## 2018-09-20 ENCOUNTER — Telehealth: Payer: Self-pay | Admitting: Hematology

## 2018-09-20 NOTE — Telephone Encounter (Signed)
Per 12/10 los already done °

## 2018-09-21 ENCOUNTER — Inpatient Hospital Stay: Payer: Medicare Other

## 2018-09-21 DIAGNOSIS — Z7189 Other specified counseling: Secondary | ICD-10-CM

## 2018-09-21 DIAGNOSIS — Z5112 Encounter for antineoplastic immunotherapy: Secondary | ICD-10-CM | POA: Diagnosis not present

## 2018-09-21 DIAGNOSIS — C8330 Diffuse large B-cell lymphoma, unspecified site: Secondary | ICD-10-CM

## 2018-09-21 MED ORDER — PEGFILGRASTIM-CBQV 6 MG/0.6ML ~~LOC~~ SOSY
6.0000 mg | PREFILLED_SYRINGE | Freq: Once | SUBCUTANEOUS | Status: AC
  Administered 2018-09-21: 6 mg via SUBCUTANEOUS

## 2018-09-21 MED ORDER — PEGFILGRASTIM-CBQV 6 MG/0.6ML ~~LOC~~ SOSY
PREFILLED_SYRINGE | SUBCUTANEOUS | Status: AC
  Filled 2018-09-21: qty 0.6

## 2018-09-21 NOTE — Patient Instructions (Signed)
Pegfilgrastim injection What is this medicine? PEGFILGRASTIM (PEG fil gra stim) is a long-acting granulocyte colony-stimulating factor that stimulates the growth of neutrophils, a type of white blood cell important in the body's fight against infection. It is used to reduce the incidence of fever and infection in patients with certain types of cancer who are receiving chemotherapy that affects the bone marrow, and to increase survival after being exposed to high doses of radiation. This medicine may be used for other purposes; ask your health care provider or pharmacist if you have questions. COMMON BRAND NAME(S): Neulasta What should I tell my health care provider before I take this medicine? They need to know if you have any of these conditions: -kidney disease -latex allergy -ongoing radiation therapy -sickle cell disease -skin reactions to acrylic adhesives (On-Body Injector only) -an unusual or allergic reaction to pegfilgrastim, filgrastim, other medicines, foods, dyes, or preservatives -pregnant or trying to get pregnant -breast-feeding How should I use this medicine? This medicine is for injection under the skin. If you get this medicine at home, you will be taught how to prepare and give the pre-filled syringe or how to use the On-body Injector. Refer to the patient Instructions for Use for detailed instructions. Use exactly as directed. Tell your healthcare provider immediately if you suspect that the On-body Injector may not have performed as intended or if you suspect the use of the On-body Injector resulted in a missed or partial dose. It is important that you put your used needles and syringes in a special sharps container. Do not put them in a trash can. If you do not have a sharps container, call your pharmacist or healthcare provider to get one. Talk to your pediatrician regarding the use of this medicine in children. While this drug may be prescribed for selected conditions,  precautions do apply. Overdosage: If you think you have taken too much of this medicine contact a poison control center or emergency room at once. NOTE: This medicine is only for you. Do not share this medicine with others. What if I miss a dose? It is important not to miss your dose. Call your doctor or health care professional if you miss your dose. If you miss a dose due to an On-body Injector failure or leakage, a new dose should be administered as soon as possible using a single prefilled syringe for manual use. What may interact with this medicine? Interactions have not been studied. Give your health care provider a list of all the medicines, herbs, non-prescription drugs, or dietary supplements you use. Also tell them if you smoke, drink alcohol, or use illegal drugs. Some items may interact with your medicine. This list may not describe all possible interactions. Give your health care provider a list of all the medicines, herbs, non-prescription drugs, or dietary supplements you use. Also tell them if you smoke, drink alcohol, or use illegal drugs. Some items may interact with your medicine. What should I watch for while using this medicine? You may need blood work done while you are taking this medicine. If you are going to need a MRI, CT scan, or other procedure, tell your doctor that you are using this medicine (On-Body Injector only). What side effects may I notice from receiving this medicine? Side effects that you should report to your doctor or health care professional as soon as possible: -allergic reactions like skin rash, itching or hives, swelling of the face, lips, or tongue -dizziness -fever -pain, redness, or irritation at site   where injected -pinpoint red spots on the skin -red or dark-brown urine -shortness of breath or breathing problems -stomach or side pain, or pain at the shoulder -swelling -tiredness -trouble passing urine or change in the amount of urine Side  effects that usually do not require medical attention (report to your doctor or health care professional if they continue or are bothersome): -bone pain -muscle pain This list may not describe all possible side effects. Call your doctor for medical advice about side effects. You may report side effects to FDA at 1-800-FDA-1088. Where should I keep my medicine? Keep out of the reach of children. Store pre-filled syringes in a refrigerator between 2 and 8 degrees C (36 and 46 degrees F). Do not freeze. Keep in carton to protect from light. Throw away this medicine if it is left out of the refrigerator for more than 48 hours. Throw away any unused medicine after the expiration date. NOTE: This sheet is a summary. It may not cover all possible information. If you have questions about this medicine, talk to your doctor, pharmacist, or health care provider.  2018 Elsevier/Gold Standard (2016-09-23 12:58:03)  

## 2018-09-26 ENCOUNTER — Ambulatory Visit: Payer: Medicare Other | Admitting: Physical Therapy

## 2018-09-26 DIAGNOSIS — R2689 Other abnormalities of gait and mobility: Secondary | ICD-10-CM

## 2018-09-26 DIAGNOSIS — Z9181 History of falling: Secondary | ICD-10-CM

## 2018-09-26 DIAGNOSIS — M6281 Muscle weakness (generalized): Secondary | ICD-10-CM

## 2018-09-26 NOTE — Patient Instructions (Signed)
Stand in front of bed or recliner so if you lose your balance or need to sit quickly you will be ready Remember to stand tall and feel the weight through both of your feet.  1. Hold band at shoulder width apart.  Raise arms overhead and return. 10 reptitions.  2. Band in same position. Pull band apart and raise arms overhead 10 repetitions.   Keep good posture  3. Hold arms at chest level and pull both arms apart so band comes close to chest.  Feel the work in your back   4..Next raise one arm with band diagonally overhead.  Try to get arm close to ear.  Keep good posture  5. Finally, keep elbows at waist with band in both fists. Pull fist apart with elbows bent tucked into waist.  Feel the work in shoulder blades

## 2018-09-26 NOTE — Therapy (Signed)
[place], [place], [address] Phone: [phone]   Fax:  [phone]  Physical Therapy Treatment  Patient Details  Name: [name] MRN: [id number] Date of Birth: [birth year] Referring Provider (PT): Dr. Irene Limbo   Encounter Date: 09/26/2018  PT End of Session - 09/26/18 1254    Visit Number  9    Number of Visits  12    Date for PT Re-Evaluation  09/21/18    PT Start Time  1100    PT Stop Time  1145    PT Time Calculation (min)  45 min    Activity Tolerance  Patient limited by fatigue    Behavior During Therapy  [facility] for tasks assessed/performed       Past Medical History:  Diagnosis Date  . A-fib (HCC)    hx.- sinus rhythm now  . Acid reflux   . AKI (acute kidney injury) (HCC)    mild  . Anxiety   . Arthritis   . Atrial dilatation, bilateral    Severly, enlargement  per ECHO 11/03/16  . BPH (benign prostatic hyperplasia)   . CAD (coronary artery disease)   . Depression   . Dislocation of internal right hip prosthesis, sequela   . Dysrhythmia    hx a fib  . Elevated brain natriuretic peptide (BNP) level   . Environmental allergies   . Fracture of wrist 10/23/13   LEFT  . High cholesterol   . History of Graves' disease   . History of kidney stones    surg. removal 02/2017  . History of syncope   . HLD (hyperlipidemia)   . Hypertension   . Hypothyroidism    thyroid removed  . Left knee injury    cap dislocated  . Low testosterone, possible hypogonadism 10/01/2012  . Lumbago   . Metabolic bone disease   . Multiple falls   . Osteoporosis    Severe  . Plaque psoriasis   . Sleep apnea    cpap machine-settings 17  . Thrombocytopenia ([place])   . Thyroid disease    HX GRAVES DISEASE  . Transfusion history    s/p 12'13 hip surgery  . Unsteady gait   . UTI (urinary tract infection)   . Vertebral compression fracture (HCC)    L1-wears brace    Past Surgical History:  Procedure  Laterality Date  . APPENDECTOMY    . CARDIAC CATHETERIZATION  2013  . CATARACT EXTRACTION, BILATERAL    . COLONOSCOPY    . CYSTOSCOPY WITH RETROGRADE PYELOGRAM, URETEROSCOPY AND STENT PLACEMENT Left 11/07/2017   Procedure: CYSTOSCOPY WITH RETROGRADE PYELOGRAM, URETEROSCOPY AND STENT PLACEMENT;  Surgeon: Cleon Gustin, MD;  Location: [facility];  Service: Urology;  Laterality: Left;  . CYSTOSCOPY/URETEROSCOPY/HOLMIUM LASER/STENT PLACEMENT Right 03/11/2017   Procedure: CYSTOSCOPY/URETEROSCOPYSTENT PLACEMENT right ureter retrograde pylegram;  Surgeon: Cleon Gustin, MD;  [facility]: [facility];  Service: Urology;  Laterality: Right;  . HARDWARE REMOVAL  10/05/2012   Procedure: HARDWARE REMOVAL;  Surgeon: [name], MD;  [facility];  Service: Orthopedics;  Laterality: Right;  REMOVING  STRYKER  GAMMA NAIL  . HARDWARE REMOVAL Right 07/03/2013   Procedure: HARDWARE REMOVAL RIGHT TIBIA ;  Surgeon: Rozanna Box, MD;  Location: [facility];  Service: Orthopedics;  Laterality: Right;  . HIP CLOSED REDUCTION Right 10/15/2013   Procedure: CLOSED MANIPULATION HIP;  Surgeon: [name], MD;  [facility];  Service: Orthopedics;  Laterality: Right;  . hip revision  June 2017  . HOLMIUM LASER APPLICATION Right 05/19/3809   Procedure: HOLMIUM LASER APPLICATION;  Surgeon: Cleon Gustin, MD;  [facility];  Service: Urology;  Laterality: Right;  . INCISION AND DRAINAGE HIP Right 11/16/2013   Procedure: IRRIGATION AND DEBRIDEMENT RIGHT HIP;  Surgeon: [name], MD;  [facility];  Service: Orthopedics;  Laterality: Right;  . IR IMAGING GUIDED PORT INSERTION  06/22/2018  . IR URETERAL STENT LEFT NEW ACCESS W/O SEP NEPHROSTOMY CATH  10/24/2017  . JOINT REPLACEMENT     hip-right x2  . KNEE SURGERY Bilateral 2012   Total knee replacements  . NEPHROLITHOTOMY Right 02/08/2017   Procedure: NEPHROLITHOTOMY PERCUTANEOUS WITH SURGEON ACCESS;  Surgeon: Cleon Gustin, MD;  [facility];   Service: Urology;  Laterality: Right;  . NEPHROLITHOTOMY Left 10/24/2017   Procedure: NEPHROLITHOTOMY PERCUTANEOUS;  Surgeon: Cleon Gustin, MD;  [facility];  Service: Urology;  Laterality: Left;  . ORIF TIBIA FRACTURE Right 02/06/2013   Procedure: OPEN REDUCTION INTERNAL FIXATION (ORIF) TIBIA FRACTURE WITH IM ROD FIBULA;  Surgeon: Rozanna Box, MD;  Location: [facility];  Service: Orthopedics;  Laterality: Right;  . ORIF TIBIA FRACTURE Right 07/03/2013   Procedure: RIGHT TIBIA NON UNION REPAIR ;  Surgeon: Rozanna Box, MD;  Location: [facility];  Service: Orthopedics;  Laterality: Right;  . ORIF WRIST FRACTURE  10/02/2012   Procedure: OPEN REDUCTION INTERNAL FIXATION (ORIF) WRIST FRACTURE;  Surgeon: Roseanne Kaufman, MD;  [facility];  Service: Orthopedics;  Laterality: Right;  WITH   ANTIBIOTIC  CEMENT  . ORIF WRIST FRACTURE Left 10/28/2013   Procedure: OPEN REDUCTION INTERNAL FIXATION (ORIF) WRIST FRACTURE with allograft;  Surgeon: Roseanne Kaufman, MD;  [facility];  Service: Orthopedics;  Laterality: Left;  DVR Plate  . QUADRICEPS TENDON REPAIR Left 07/15/2017   Procedure: REPAIR QUADRICEP TENDON;  Surgeon: Frederik Pear, MD;  Location: [facility];  Service: Orthopedics;  Laterality: Left;  . right femur surgery  05/2012  . THYROIDECTOMY  02/1986  . TOTAL HIP REVISION  10/05/2012   Procedure: TOTAL HIP REVISION;  Surgeon: [name], MD;  [facility];  Service: Orthopedics;  Laterality: Right;  RIGHT TOTAL HIP REVISION  . TOTAL HIP REVISION Right 09/17/2013   Procedure: REVISION RIGHT TOTAL HIP ARTHROPLASTY ;  Surgeon: [name], MD;  [facility];  Service: Orthopedics;  Laterality: Right;  . TOTAL HIP REVISION Right 10/26/2013   Procedure: REVISION RIGHT TOTAL HIP ARTHROPLASTY;  Surgeon: [name], MD;  [facility];  Service: Orthopedics;  Laterality: Right;  . TOTAL HIP REVISION  03/2016  . TOTAL KNEE REVISION Left 04/11/2017   Procedure: TOTAL KNEE REVISION  PATELLA and TIBIA;  Surgeon: Frederik Pear, MD;  Location: [facility];  Service: Orthopedics;  Laterality: Left;  . WRIST FRACTURE SURGERY  05/2012    There were no vitals filed for this visit.  Subjective Assessment - 09/26/18 1110    Subjective  Pt states he had another fall yesterday . He said he was trying to get into the bathroom and let go of the walker. He fell back onto his bottom and scooted to furniture in the room ans was able to push himself up . "I feel like I'm getting weaker" He has had another infusion  ( 09/19/2018) last week and has one more at the first of January. Then he should be in remission.  He feels that he is short of breath and his  knees are buckling.  He notices that he has never fallen if he has 2 hands on the walker.  Pt feels he is getting weaker and is anxious about his lack of mobilty and his falls.     Pertinent History  2013 fall resulting in a femur break on the Rt leg resulting in multiple surgeries due to failures including hip replacements another broken tibia leading to the discovery of osteoporosis and a metabolic bone disease.  After forteo injections x 2 years he is now in the osteopenia category.  Has a new scan coming up in a few weeks.  prior to the cancer diagnosis he was able to be confident with a cane.  Then was diagnosed with diffuse non-hogkin large cell B-lymphoma 07/09/18 located in tonsils and Rt groin. Treatment consists of an infusion every 3 weeks hopefully for a total of 5, A-fib controlled, HTN controlled with medication    Limitations  Walking;Standing;House hold activities    Patient Stated Goals  get my balance back    Currently in Pain?  Yes    Pain Score  --   did not rate    Pain Location  Finger (Comment which one)   that he hurt on his recent fall    Pain Orientation  Right    Pain Descriptors / Indicators  --   pt did not say    Pain Onset  In the past 7 days    Pain Frequency  Intermittent         [facility] PT Assessment - 09/26/18  0001      Assessment   Medical Diagnosis  diffuse large B cell lymphoma    Referring Provider (PT)  Dr. Irene Limbo    Onset Date/Surgical Date  07/06/18      Prior Function   Level of Independence  Independent                   [facility] Adult PT Treatment/Exercise - 09/26/18 0001      Exercises   Exercises  Shoulder      Knee/Hip Exercises: Standing   Other Standing Knee Exercises  sitting on corner of mat with right leg in front and left leg bent behind for press up through right leg to partial stand       Shoulder Exercises: Standing   Horizontal ABduction  Strengthening;Right;Left;10 reps;Theraband    Theraband Level (Shoulder Horizontal ABduction)  Level 2 (Red)    External Rotation  Strengthening;Right;Left;10 reps    Flexion  Right;Left;10 reps    Theraband Level (Shoulder Flexion)  Level 2 (Red)    Flexion Limitations  cues to keep weight bearing through feet and core engaged     Diagonals  Strengthening;Right;Left;10 reps;Theraband    Other Standing Exercises  printed out specific directions for above exercises for pt to follow at home              PT Education - 09/26/18 1142    Education Details  standing UE band work     [employer]) Educated  Patient    Methods  Explanation;Demonstration;Handout    Comprehension  Verbalized understanding;Returned demonstration          PT Long Term Goals - 09/26/18 1257      PT LONG TERM GOAL #1   Title  Pt will improve sit to stand x 30sec from 21" surface to 8 times     Time  6    Period  Weeks    Status  On-going      PT LONG TERM GOAL #2   Title  Pt will improve [place] balance scale to at least 10/40    Time  6    Period  Weeks    Status  On-going      PT LONG TERM GOAL #3   Title  Pt will be ind with LRAD for home and community use    Time  6    Period  Weeks    Status  On-going      PT LONG TERM GOAL #4   Title  Pt will perform TUG with LRAD at 28sec or less     Time  6    Period  Weeks     Status  On-going      PT LONG TERM GOAL #5   Title  Pt will report no instances of Rt LE giving out x 5 days at home     Period  Weeks    Status  On-going            Plan - 09/26/18 1255    Clinical Impression Statement  Pt is reporting more feelings of weakness and had another fall when he took his hands off the walker.  He has one more infustion in early January and hopes to be in remission after that and will be able to gain strength then  Renewal sent     Rehab Potential  Good    PT Frequency  2x / week    PT Duration  6 weeks    PT Treatment/Interventions  ADLs/Self Care Home Management;Therapeutic exercise;Patient/family education;Gait training;Stair training;Balance training;Neuromuscular re-education    PT Next Visit Plan  Do standing and balance activties as well as UE strengthening in standing        Patient will benefit from skilled therapeutic intervention in order to improve the following deficits and impairments:  Abnormal gait, Decreased activity tolerance, Decreased endurance, Decreased strength, Decreased balance, Difficulty walking  Visit Diagnosis: Other abnormalities of gait and mobility - Plan: PT plan of care cert/re-cert  History of falling - Plan: PT plan of care cert/re-cert  Muscle weakness (generalized) - Plan: PT plan of care cert/re-cert     Problem List Patient Active Problem List   Diagnosis Date Noted  . Counseling regarding advance care planning and goals of care 07/09/2018  . Diffuse large B-cell lymphoma ([place]) 06/23/2018  . Port-A-Cath in place 06/23/2018  . Hypogonadism in male 11/22/2017  . Nephrolithiasis 10/24/2017  . Recurrent dislocation of left patella 07/15/2017  . Recurrent subluxation of patella, left 07/08/2017  . S/P revision of total knee 04/11/2017  . Failed total knee, left, initial encounter ([place]) 04/09/2017  . Renal calculus, right 02/08/2017  . Protein-calorie malnutrition, severe ([place]) 12/08/2016  . Chronic  obstructive pulmonary disease ([place]) 12/08/2016  . Staghorn renal calculus 11/05/2016  . Wound drainage right hip 11/15/2013  . Expected blood loss anemia 09/18/2013  . Hyponatremia 09/18/2013  . S/P right TH revision 09/17/2013  . Nonunion, fracture, Right tibia  07/03/2013  . UTI (urinary tract infection) 07/03/2013  . Pathological fracture due to osteoporosis with delayed healing 04/24/2013  . Osteoporosis with fracture 04/24/2013  . Rash and nonspecific skin eruption 03/08/2013  . Syncope 02/21/2013  . PAF (paroxysmal atrial fibrillation) ([place]) 02/21/2013  . Chronic anticoagulation 02/21/2013  . Fall 02/08/2013  . Physical deconditioning 02/08/2013  . Low back pain radiating to both legs 02/08/2013  . Compression fracture of L1 lumbar vertebra (HCC)  02/08/2013  . Right tibial fracture 02/05/2013  . Obesity, unspecified 02/05/2013  . Osteomyelitis of right wrist ([place]) 11/02/2012  . Anemia 10/06/2012  . HTN (hypertension) 10/03/2012  . Psoriasis 10/03/2012  . Dyslipidemia 10/03/2012  . Vitamin D deficiency 10/03/2012  . GERD (gastroesophageal reflux disease) 10/03/2012  . Hyperglycemia 10/03/2012  . Anxiety 10/03/2012  . Depression 10/03/2012  . CAD (coronary artery disease) 10/03/2012  . DJD (degenerative joint disease) 10/03/2012  . Chronic gingivitis 10/03/2012  . Low testosterone, possible hypogonadism 10/01/2012  . Normocytic anemia 09/30/2012  . Right femoral fracture ([place]) 09/29/2012  . Right forearm fracture 09/29/2012  . Atrial fibrillation ([place]) 09/29/2012  . Hypothyroidism 09/29/2012  . History of recurrent UTIs 09/29/2012   Donato Heinz. Owens Shark PT  Norwood Levo 09/26/2018, 1:00 PM  [place], [place], [address] Phone: [phone]   Fax:  [phone]  Name: [name] MRN: [id number] Date of Birth: Jun 30, 1946

## 2018-09-28 ENCOUNTER — Encounter: Payer: Self-pay | Admitting: Physical Therapy

## 2018-09-28 ENCOUNTER — Ambulatory Visit: Payer: Medicare Other | Admitting: Physical Therapy

## 2018-09-28 DIAGNOSIS — Z9181 History of falling: Secondary | ICD-10-CM

## 2018-09-28 DIAGNOSIS — R2689 Other abnormalities of gait and mobility: Secondary | ICD-10-CM | POA: Diagnosis not present

## 2018-09-28 DIAGNOSIS — M6281 Muscle weakness (generalized): Secondary | ICD-10-CM

## 2018-09-28 NOTE — Therapy (Signed)
[place], [place], [address] Phone: [phone]   Fax:  [phone]  Physical Therapy Treatment  Progress Note Reporting Period 08/10/2018  to 09/28/2018  See note below for Objective Data and Assessment of Progress/Goals.      Patient Details  Name: [name] MRN: [id number] Date of Birth: 1946/05/12   Encounter Date: 09/28/2018  PT End of Session - 09/28/18 1251    Visit Number  10    Number of Visits  12    Date for PT Re-Evaluation  11/10/18       Past Medical History:  Diagnosis Date  . A-fib (HCC)    hx.- sinus rhythm now  . Acid reflux   . AKI (acute kidney injury) (HCC)    mild  . Anxiety   . Arthritis   . Atrial dilatation, bilateral    Severly, enlargement  per ECHO 11/03/16  . BPH (benign prostatic hyperplasia)   . CAD (coronary artery disease)   . Depression   . Dislocation of internal right hip prosthesis, sequela   . Dysrhythmia    hx a fib  . Elevated brain natriuretic peptide (BNP) level   . Environmental allergies   . Fracture of wrist 10/23/13   LEFT  . High cholesterol   . History of Graves' disease   . History of kidney stones    surg. removal 02/2017  . History of syncope   . HLD (hyperlipidemia)   . Hypertension   . Hypothyroidism    thyroid removed  . Left knee injury    cap dislocated  . Low testosterone, possible hypogonadism 10/01/2012  . Lumbago   . Metabolic bone disease   . Multiple falls   . Osteoporosis    Severe  . Plaque psoriasis   . Sleep apnea    cpap machine-settings 17  . Thrombocytopenia ([place])   . Thyroid disease    HX GRAVES DISEASE  . Transfusion history    s/p 12'13 hip surgery  . Unsteady gait   . UTI (urinary tract infection)   . Vertebral compression fracture (HCC)    L1-wears brace    Past Surgical History:  Procedure Laterality Date  . APPENDECTOMY    . CARDIAC CATHETERIZATION  2013  . CATARACT  EXTRACTION, BILATERAL    . COLONOSCOPY    . CYSTOSCOPY WITH RETROGRADE PYELOGRAM, URETEROSCOPY AND STENT PLACEMENT Left 11/07/2017   Procedure: CYSTOSCOPY WITH RETROGRADE PYELOGRAM, URETEROSCOPY AND STENT PLACEMENT;  Surgeon: Cleon Gustin, MD;  Location: [facility];  Service: Urology;  Laterality: Left;  . CYSTOSCOPY/URETEROSCOPY/HOLMIUM LASER/STENT PLACEMENT Right 03/11/2017   Procedure: CYSTOSCOPY/URETEROSCOPYSTENT PLACEMENT right ureter retrograde pylegram;  Surgeon: Cleon Gustin, MD;  Location: [facility];  Service: Urology;  Laterality: Right;  . HARDWARE REMOVAL  10/05/2012   Procedure: HARDWARE REMOVAL;  Surgeon: [name], MD;  Location: [facility];  Service: Orthopedics;  Laterality: Right;  REMOVING  STRYKER  GAMMA NAIL  . HARDWARE REMOVAL Right 07/03/2013   Procedure: HARDWARE REMOVAL RIGHT TIBIA ;  Surgeon: Rozanna Box, MD;  Location: [facility];  Service: Orthopedics;  Laterality: Right;  . HIP CLOSED REDUCTION Right 10/15/2013   Procedure: CLOSED MANIPULATION HIP;  Surgeon: [name], MD;  Location: [facility];  Service: Orthopedics;  Laterality: Right;  . hip revision     June 2017  . HOLMIUM LASER APPLICATION Right 04/17/2422   Procedure: HOLMIUM LASER APPLICATION;  Surgeon: Cleon Gustin, MD;  Location: [facility];  Service: Urology;  Laterality: Right;  . INCISION AND DRAINAGE HIP Right 11/16/2013   Procedure: IRRIGATION AND DEBRIDEMENT RIGHT HIP;  Surgeon: [name], MD;  Location: [facility];  Service: Orthopedics;  Laterality: Right;  . IR IMAGING GUIDED PORT INSERTION  06/22/2018  . IR URETERAL STENT LEFT NEW ACCESS W/O SEP NEPHROSTOMY CATH  10/24/2017  . JOINT REPLACEMENT     hip-right x2  . KNEE SURGERY Bilateral 2012   Total knee replacements  . NEPHROLITHOTOMY Right 02/08/2017   Procedure: NEPHROLITHOTOMY PERCUTANEOUS WITH SURGEON ACCESS;  Surgeon: Cleon Gustin, MD;  [facility];  Service: Urology;  Laterality: Right;  . NEPHROLITHOTOMY Left 10/24/2017    Procedure: NEPHROLITHOTOMY PERCUTANEOUS;  Surgeon: Cleon Gustin, MD;  [facility];  Service: Urology;  Laterality: Left;  . ORIF TIBIA FRACTURE Right 02/06/2013   Procedure: OPEN REDUCTION INTERNAL FIXATION (ORIF) TIBIA FRACTURE WITH IM ROD FIBULA;  Surgeon: Rozanna Box, MD;  Location: [facility];  Service: Orthopedics;  Laterality: Right;  . ORIF TIBIA FRACTURE Right 07/03/2013   Procedure: RIGHT TIBIA NON UNION REPAIR ;  Surgeon: Rozanna Box, MD;  Location: [facility];  Service: Orthopedics;  Laterality: Right;  . ORIF WRIST FRACTURE  10/02/2012   Procedure: OPEN REDUCTION INTERNAL FIXATION (ORIF) WRIST FRACTURE;  Surgeon: Roseanne Kaufman, MD;  Location[facility];  Service: Orthopedics;  Laterality: Right;  WITH   ANTIBIOTIC  CEMENT  . ORIF WRIST FRACTURE Left 10/28/2013   Procedure: OPEN REDUCTION INTERNAL FIXATION (ORIF) WRIST FRACTURE with allograft;  Surgeon: Roseanne Kaufman, MD;  Location[facility];  Service: Orthopedics;  Laterality: Left;  DVR Plate  . QUADRICEPS TENDON REPAIR Left 07/15/2017   Procedure: REPAIR QUADRICEP TENDON;  Surgeon: Frederik Pear, MD;  Location: [facility];  Service: Orthopedics;  Laterality: Left;  . right femur surgery  05/2012  . THYROIDECTOMY  02/1986  . TOTAL HIP REVISION  10/05/2012   Procedure: TOTAL HIP REVISION;  Surgeon: [name], MD;  [facility];  Service: Orthopedics;  Laterality: Right;  RIGHT TOTAL HIP REVISION  . TOTAL HIP REVISION Right 09/17/2013   Procedure: REVISION RIGHT TOTAL HIP ARTHROPLASTY ;  Surgeon: [name], MD;  [facility];  Service: Orthopedics;  Laterality: Right;  . TOTAL HIP REVISION Right 10/26/2013   Procedure: REVISION RIGHT TOTAL HIP ARTHROPLASTY;  Surgeon: [name], MD;  [facility];  Service: Orthopedics;  Laterality: Right;  . TOTAL HIP REVISION  03/2016  . TOTAL KNEE REVISION Left 04/11/2017   Procedure: TOTAL KNEE REVISION PATELLA and TIBIA;  Surgeon: Frederik Pear, MD;  Location: [facility];  Service:  Orthopedics;  Laterality: Left;  . WRIST FRACTURE SURGERY  05/2012    There were no vitals filed for this visit.  Subjective Assessment - 09/28/18 1110    Subjective  Pt states he is feeling better today.  He has an active day ahead so wants to work on balance and strength today     Pertinent History  2013 fall resulting in a femur break on the Rt leg resulting in multiple surgeries due to failures including hip replacements another broken tibia leading to the discovery of osteoporosis and a metabolic bone disease.  After forteo injections x 2 years he is now in the osteopenia category.  Has a new scan coming up in a few weeks.  prior to the cancer diagnosis he was able to be confident with a cane.  Then was diagnosed with diffuse  non-hogkin large cell B-lymphoma 07/09/18 located in tonsils and Rt groin. Treatment consists of an infusion every 3 weeks hopefully for a total of 5, A-fib controlled, HTN controlled with medication    Patient Stated Goals  get my balance back    Currently in Pain?  No/denies         [facility] PT Assessment - 09/28/18 0001      Functional Tests   Functional tests  Sit to Stand      Sit to Stand   Comments  7 reps in 30 seconds      Strength   Overall Strength Comments  pt has knee exension lag on left knee with attemps of SLR       Timed Up and Go Test   Normal TUG (seconds)  18.5   pt used a RW                   [facility] Adult PT Treatment/Exercise - 09/28/18 0001      Exercises   Exercises  Knee/Hip;Ankle      Knee/Hip Exercises: Standing   Other Standing Knee Exercises  step back and weight shift onto left leg for quad activation       Knee/Hip Exercises: Seated   Long Arc Quad  Strengthening;10 reps    Hamstring Curl  Strengthening;Right;Left;10 reps    Hamstring Limitations  manual resistance on green theraband      Knee/Hip Exercises: Supine   Quad Sets  Strengthening;Left;10 reps    Short Arc Quad Sets  Strengthening;Right;Left;2  sets;10 reps    Short Arc Quad Sets Limitations  left leg is weaker than right    able to do 4 pounds with right and no weight on left    Hip Adduction Isometric  Strengthening;Right;Left;2 sets;10 reps    Other Supine Knee/Hip Exercises  knee drop with hold at 3 points and drag it back in  10 with each leg       Ankle Exercises: Seated   Heel Raises  Right;Left;10 reps    Toe Raise  10 reps                  PT Long Term Goals - 09/28/18 1150      PT LONG TERM GOAL #1   Title  Pt will improve sit to stand x 30sec from 21" surface to 8 times     Baseline  09/28/2018  7 times in 30 sec.     Time  6    Period  Weeks    Status  On-going      PT LONG TERM GOAL #2   Title  Pt will improve [place] balance scale to at least 10/40    Time  6    Status  On-going      PT LONG TERM GOAL #3   Title  Pt will be ind with LRAD for home and community use    Baseline  09/28/2018 pt continues to use a RW , he has had episode of fall without hands on walker     Status  Partially Met      PT LONG TERM GOAL #4   Title  Pt will perform TUG with LRAD at 28sec or less     Baseline  on 09/28/2018  18.5 sec     Status  Achieved      PT LONG TERM GOAL #5   Title  Pt will report no instances of Rt LE giving out  x 5 days at home     Baseline  still has difficulty with leg giving out     Time  6    Period  Weeks    Status  On-going      PT LONG TERM GOAL #6   Title  Pt be able to perform one SLR on left leg to 8 inches with no terminal extension lag     Time  6    Period  Weeks    Status  New            Plan - 09/28/18 1242    Clinical Impression Statement  Sujay is feeling better today, but has good and bad days as he progresses through his infusions.  He feels weaker and has weakness in left knee with weak quadriceps and inability to do a straight leg without terminal extension lag today Focused on leg exercises today     PT Frequency  2x / week    PT Duration  6 weeks     PT Treatment/Interventions  ADLs/Self Care Home Management;Therapeutic exercise;Patient/family education;Gait training;Stair training;Balance training;Neuromuscular re-education    PT Next Visit Plan  Do standing and balance activties as well as UE strengthening in standing     Consulted and Agree with Plan of Care  Patient       Patient will benefit from skilled therapeutic intervention in order to improve the following deficits and impairments:     Visit Diagnosis: Other abnormalities of gait and mobility  History of falling  Muscle weakness (generalized)     Problem List Patient Active Problem List   Diagnosis Date Noted  . Counseling regarding advance care planning and goals of care 07/09/2018  . Diffuse large B-cell lymphoma ([place]) 06/23/2018  . Port-A-Cath in place 06/23/2018  . Hypogonadism in male 11/22/2017  . Nephrolithiasis 10/24/2017  . Recurrent dislocation of left patella 07/15/2017  . Recurrent subluxation of patella, left 07/08/2017  . S/P revision of total knee 04/11/2017  . Failed total knee, left, initial encounter ([place]) 04/09/2017  . Renal calculus, right 02/08/2017  . Protein-calorie malnutrition, severe ([place]) 12/08/2016  . Chronic obstructive pulmonary disease ([place]) 12/08/2016  . Staghorn renal calculus 11/05/2016  . Wound drainage right hip 11/15/2013  . Expected blood loss anemia 09/18/2013  . Hyponatremia 09/18/2013  . S/P right TH revision 09/17/2013  . Nonunion, fracture, Right tibia  07/03/2013  . UTI (urinary tract infection) 07/03/2013  . Pathological fracture due to osteoporosis with delayed healing 04/24/2013  . Osteoporosis with fracture 04/24/2013  . Rash and nonspecific skin eruption 03/08/2013  . Syncope 02/21/2013  . PAF (paroxysmal atrial fibrillation) ([place]) 02/21/2013  . Chronic anticoagulation 02/21/2013  . Fall 02/08/2013  . Physical deconditioning 02/08/2013  . Low back pain radiating to both legs 02/08/2013  . Compression  fracture of L1 lumbar vertebra (HCC) 02/08/2013  . Right tibial fracture 02/05/2013  . Obesity, unspecified 02/05/2013  . Osteomyelitis of right wrist ([place]) 11/02/2012  . Anemia 10/06/2012  . HTN (hypertension) 10/03/2012  . Psoriasis 10/03/2012  . Dyslipidemia 10/03/2012  . Vitamin D deficiency 10/03/2012  . GERD (gastroesophageal reflux disease) 10/03/2012  . Hyperglycemia 10/03/2012  . Anxiety 10/03/2012  . Depression 10/03/2012  . CAD (coronary artery disease) 10/03/2012  . DJD (degenerative joint disease) 10/03/2012  . Chronic gingivitis 10/03/2012  . Low testosterone, possible hypogonadism 10/01/2012  . Normocytic anemia 09/30/2012  . Right femoral fracture ([place]) 09/29/2012  . Right forearm fracture 09/29/2012  . Atrial fibrillation (  [place]) 09/29/2012  . Hypothyroidism 09/29/2012  . History of recurrent UTIs 09/29/2012    [name] 09/28/2018, 12:56 PM  [place], [place], [address] Phone: [phone]   Fax:  [phone]  Name: [name] MRN: [id number] Date of Birth: 1945/11/29

## 2018-10-02 ENCOUNTER — Ambulatory Visit: Payer: Medicare Other | Admitting: Physical Therapy

## 2018-10-02 ENCOUNTER — Ambulatory Visit (INDEPENDENT_AMBULATORY_CARE_PROVIDER_SITE_OTHER): Payer: Medicare Other | Admitting: Internal Medicine

## 2018-10-02 ENCOUNTER — Encounter

## 2018-10-02 ENCOUNTER — Other Ambulatory Visit (INDEPENDENT_AMBULATORY_CARE_PROVIDER_SITE_OTHER): Payer: Medicare Other

## 2018-10-02 ENCOUNTER — Encounter: Payer: Self-pay | Admitting: Physical Therapy

## 2018-10-02 ENCOUNTER — Encounter: Payer: Self-pay | Admitting: Internal Medicine

## 2018-10-02 VITALS — BP 128/66 | HR 78 | Temp 98.3°F | Ht 75.0 in | Wt 302.0 lb

## 2018-10-02 DIAGNOSIS — R2689 Other abnormalities of gait and mobility: Secondary | ICD-10-CM

## 2018-10-02 DIAGNOSIS — N320 Bladder-neck obstruction: Secondary | ICD-10-CM

## 2018-10-02 DIAGNOSIS — Z0000 Encounter for general adult medical examination without abnormal findings: Secondary | ICD-10-CM | POA: Diagnosis not present

## 2018-10-02 DIAGNOSIS — E785 Hyperlipidemia, unspecified: Secondary | ICD-10-CM

## 2018-10-02 DIAGNOSIS — M6281 Muscle weakness (generalized): Secondary | ICD-10-CM

## 2018-10-02 DIAGNOSIS — Z9181 History of falling: Secondary | ICD-10-CM

## 2018-10-02 LAB — BASIC METABOLIC PANEL
BUN: 22 mg/dL (ref 6–23)
CO2: 27 mEq/L (ref 19–32)
Calcium: 8.9 mg/dL (ref 8.4–10.5)
Chloride: 104 mEq/L (ref 96–112)
Creatinine, Ser: 1.11 mg/dL (ref 0.40–1.50)
GFR: 69.01 mL/min (ref >60.00)
Glucose, Bld: 121 mg/dL — ABNORMAL HIGH (ref 70–99)
Potassium: 4.2 mEq/L (ref 3.5–5.1)
Sodium: 140 mEq/L (ref 135–145)

## 2018-10-02 LAB — URINALYSIS, ROUTINE W REFLEX MICROSCOPIC
Bilirubin Urine: NEGATIVE
Ketones, ur: NEGATIVE
Nitrite: NEGATIVE
Specific Gravity, Urine: 1.015 (ref 1.000–1.030)
Urine Glucose: NEGATIVE
Urobilinogen, UA: 0.2 (ref 0.0–1.0)
pH: 6 (ref 5.0–8.0)

## 2018-10-02 LAB — CBC WITH DIFFERENTIAL/PLATELET
Basophils Absolute: 0 10*3/uL (ref 0.0–0.1)
Basophils Relative: 0.5 % (ref 0.0–3.0)
Eosinophils Absolute: 0 10*3/uL (ref 0.0–0.7)
Eosinophils Relative: 0.6 % (ref 0.0–5.0)
HCT: 36.1 % — ABNORMAL LOW (ref 39.0–52.0)
Hemoglobin: 12.1 g/dL — ABNORMAL LOW (ref 13.0–17.0)
Lymphocytes Relative: 16.1 % (ref 12.0–46.0)
Lymphs Abs: 1.4 10*3/uL (ref 0.7–4.0)
MCHC: 33.5 g/dL (ref 30.0–36.0)
MCV: 96.3 fl (ref 78.0–100.0)
MONO ABS: 0.7 10*3/uL (ref 0.1–1.0)
Monocytes Relative: 8 % (ref 3.0–12.0)
Neutro Abs: 6.6 10*3/uL (ref 1.4–7.7)
Neutrophils Relative %: 74.8 % (ref 43.0–77.0)
Platelets: 129 10*3/uL — ABNORMAL LOW (ref 150.0–400.0)
RBC: 3.74 Mil/uL — ABNORMAL LOW (ref 4.22–5.81)
RDW: 19.4 % — AB (ref 11.5–15.5)
WBC: 8.9 10*3/uL (ref 4.0–10.5)

## 2018-10-02 LAB — LIPID PANEL
Cholesterol: 101 mg/dL (ref 0–200)
HDL: 52.1 mg/dL (ref >39.00)
LDL Cholesterol: 33 mg/dL (ref 0–99)
NonHDL: 49.34
TRIGLYCERIDES: 84 mg/dL (ref 0.0–149.0)
Total CHOL/HDL Ratio: 2
VLDL: 16.8 mg/dL (ref 0.0–40.0)

## 2018-10-02 LAB — HEPATIC FUNCTION PANEL
ALK PHOS: 58 U/L (ref 39–117)
ALT: 23 U/L (ref 0–53)
AST: 16 U/L (ref 0–37)
Albumin: 3.6 g/dL (ref 3.5–5.2)
Bilirubin, Direct: 0.2 mg/dL (ref 0.0–0.3)
Total Bilirubin: 0.6 mg/dL (ref 0.2–1.2)
Total Protein: 5.9 g/dL — ABNORMAL LOW (ref 6.0–8.3)

## 2018-10-02 LAB — TSH: TSH: 0.85 u[IU]/mL (ref 0.35–4.50)

## 2018-10-02 LAB — PSA: PSA: 1.01 ng/mL (ref 0.10–4.00)

## 2018-10-02 NOTE — Assessment & Plan Note (Signed)
Here for medicare wellness/physical  Diet: heart healthy  Physical activity: not sedentary  Depression/mood screen: negative  Hearing: intact to whispered voice  Visual acuity: grossly normal after cataract surgery, performs annual eye exam  ADLs: capable  Fall risk: low to moderate Home safety: good  Cognitive evaluation: intact to orientation, naming, recall and repetition  EOL planning: adv directives, full code/ I agree  I have personally reviewed and have noted  1. The patient's medical, surgical and social history  2. Their use of alcohol, tobacco or illicit drugs  3. Their current medications and supplements  4. The patient's functional ability including ADL's, fall risks, home safety risks and hearing or visual impairment.  5. Diet and physical activities  6. Evidence for depression or mood disorders - yes (on meds) 7. The roster of all physicians providing medical care to patient - is listed in the Snapshot section of the chart and reviewed today.    Today patient counseled on age appropriate routine health concerns for screening and prevention, each reviewed and up to date or declined. Immunizations reviewed and up to date or declined. Labs ordered and reviewed. Risk factors for depression reviewed and negative. Hearing function and visual acuity are intact. ADLs screened and addressed as needed. Functional ability and level of safety reviewed and appropriate. Education, counseling and referrals performed based on assessed risks today. Patient provided with a copy of personalized plan for preventive services.

## 2018-10-02 NOTE — Patient Instructions (Signed)

## 2018-10-02 NOTE — Therapy (Signed)
[place], [place], [address] Phone: [phone]   Fax:  [phone]  Physical Therapy Treatment  Patient Details  Name: [name] MRN: [id number] Date of Birth: [birth year] Referring Provider (PT): Dr. Irene Limbo   Encounter Date: 10/02/2018  PT End of Session - 10/02/18 1655    Visit Number  11    Number of Visits  24    Date for PT Re-Evaluation  11/10/18    PT Start Time  1300    PT Stop Time  1345    PT Time Calculation (min)  45 min    Activity Tolerance  Patient tolerated treatment well    Behavior During Therapy  [facility] for tasks assessed/performed       Past Medical History:  Diagnosis Date  . A-fib (HCC)    hx.- sinus rhythm now  . Acid reflux   . AKI (acute kidney injury) (HCC)    mild  . Anxiety   . Arthritis   . Atrial dilatation, bilateral    Severly, enlargement  per ECHO 11/03/16  . BPH (benign prostatic hyperplasia)   . CAD (coronary artery disease)   . Depression   . Dislocation of internal right hip prosthesis, sequela   . Dysrhythmia    hx a fib  . Elevated brain natriuretic peptide (BNP) level   . Environmental allergies   . Fracture of wrist 10/23/13   LEFT  . High cholesterol   . History of Graves' disease   . History of kidney stones    surg. removal 02/2017  . History of syncope   . HLD (hyperlipidemia)   . Hypertension   . Hypothyroidism    thyroid removed  . Left knee injury    cap dislocated  . Low testosterone, possible hypogonadism 10/01/2012  . Lumbago   . Metabolic bone disease   . Multiple falls   . Osteoporosis    Severe  . Plaque psoriasis   . Sleep apnea    cpap machine-settings 17  . Thrombocytopenia ([place])   . Thyroid disease    HX GRAVES DISEASE  . Transfusion history    s/p 12'13 hip surgery  . Unsteady gait   . UTI (urinary tract infection)   . Vertebral compression fracture (HCC)    L1-wears brace    Past Surgical History:   Procedure Laterality Date  . APPENDECTOMY    . CARDIAC CATHETERIZATION  2013  . CATARACT EXTRACTION, BILATERAL    . COLONOSCOPY    . CYSTOSCOPY WITH RETROGRADE PYELOGRAM, URETEROSCOPY AND STENT PLACEMENT Left 11/07/2017   Procedure: CYSTOSCOPY WITH RETROGRADE PYELOGRAM, URETEROSCOPY AND STENT PLACEMENT;  Surgeon: Cleon Gustin, MD;  Location: [facility];  Service: Urology;  Laterality: Left;  . CYSTOSCOPY/URETEROSCOPY/HOLMIUM LASER/STENT PLACEMENT Right 03/11/2017   Procedure: CYSTOSCOPY/URETEROSCOPYSTENT PLACEMENT right ureter retrograde pylegram;  Surgeon: Cleon Gustin, MD;  Location: [facility];  Service: Urology;  Laterality: Right;  . HARDWARE REMOVAL  10/05/2012   Procedure: HARDWARE REMOVAL;  Surgeon: [name], MD;  Location: [facility];  Service: Orthopedics;  Laterality: Right;  REMOVING  STRYKER  GAMMA NAIL  . HARDWARE REMOVAL Right 07/03/2013   Procedure: HARDWARE REMOVAL RIGHT TIBIA ;  Surgeon: Rozanna Box, MD;  Location: [facility];  Service: Orthopedics;  Laterality: Right;  . HIP CLOSED REDUCTION Right 10/15/2013   Procedure: CLOSED MANIPULATION HIP;  Surgeon: [name], MD;  Location: [facility];  Service: Orthopedics;  Laterality: Right;  . hip revision  June 2017  . HOLMIUM LASER APPLICATION Right 03/16/6811   Procedure: HOLMIUM LASER APPLICATION;  Surgeon: Cleon Gustin, MD;  Location[facility];  Service: Urology;  Laterality: Right;  . INCISION AND DRAINAGE HIP Right 11/16/2013   Procedure: IRRIGATION AND DEBRIDEMENT RIGHT HIP;  Surgeon: [name], MD;  Location: [facility];  Service: Orthopedics;  Laterality: Right;  . IR IMAGING GUIDED PORT INSERTION  06/22/2018  . IR URETERAL STENT LEFT NEW ACCESS W/O SEP NEPHROSTOMY CATH  10/24/2017  . JOINT REPLACEMENT     hip-right x2  . KNEE SURGERY Bilateral 2012   Total knee replacements  . NEPHROLITHOTOMY Right 02/08/2017   Procedure: NEPHROLITHOTOMY PERCUTANEOUS WITH SURGEON ACCESS;  Surgeon: Cleon Gustin, MD;   [facility];  Service: Urology;  Laterality: Right;  . NEPHROLITHOTOMY Left 10/24/2017   Procedure: NEPHROLITHOTOMY PERCUTANEOUS;  Surgeon: Cleon Gustin, MD;  [facility];  Service: Urology;  Laterality: Left;  . ORIF TIBIA FRACTURE Right 02/06/2013   Procedure: OPEN REDUCTION INTERNAL FIXATION (ORIF) TIBIA FRACTURE WITH IM ROD FIBULA;  Surgeon: Rozanna Box, MD;  Location: [facility];  Service: Orthopedics;  Laterality: Right;  . ORIF TIBIA FRACTURE Right 07/03/2013   Procedure: RIGHT TIBIA NON UNION REPAIR ;  Surgeon: Rozanna Box, MD;  Location: [facility];  Service: Orthopedics;  Laterality: Right;  . ORIF WRIST FRACTURE  10/02/2012   Procedure: OPEN REDUCTION INTERNAL FIXATION (ORIF) WRIST FRACTURE;  Surgeon: Roseanne Kaufman, MD;  Location[facility];  Service: Orthopedics;  Laterality: Right;  WITH   ANTIBIOTIC  CEMENT  . ORIF WRIST FRACTURE Left 10/28/2013   Procedure: OPEN REDUCTION INTERNAL FIXATION (ORIF) WRIST FRACTURE with allograft;  Surgeon: Roseanne Kaufman, MD;  Location[facility];  Service: Orthopedics;  Laterality: Left;  DVR Plate  . QUADRICEPS TENDON REPAIR Left 07/15/2017   Procedure: REPAIR QUADRICEP TENDON;  Surgeon: Frederik Pear, MD;  Location: [facility];  Service: Orthopedics;  Laterality: Left;  . right femur surgery  05/2012  . THYROIDECTOMY  02/1986  . TOTAL HIP REVISION  10/05/2012   Procedure: TOTAL HIP REVISION;  Surgeon: [name], MD;  [facility];  Service: Orthopedics;  Laterality: Right;  RIGHT TOTAL HIP REVISION  . TOTAL HIP REVISION Right 09/17/2013   Procedure: REVISION RIGHT TOTAL HIP ARTHROPLASTY ;  Surgeon: [name], MD;  [facility];  Service: Orthopedics;  Laterality: Right;  . TOTAL HIP REVISION Right 10/26/2013   Procedure: REVISION RIGHT TOTAL HIP ARTHROPLASTY;  Surgeon: [name], MD;  Location[facility];  Service: Orthopedics;  Laterality: Right;  . TOTAL HIP REVISION  03/2016  . TOTAL KNEE REVISION Left 04/11/2017   Procedure:  TOTAL KNEE REVISION PATELLA and TIBIA;  Surgeon: Frederik Pear, MD;  Location: [facility];  Service: Orthopedics;  Laterality: Left;  . WRIST FRACTURE SURGERY  05/2012    There were no vitals filed for this visit.  Subjective Assessment - 10/02/18 1304    Subjective  Pt had a busy morning with lots of appointments.  He had an episode of feeling a little woozy on his walk into the exercise room, but feels better and is cooling down after a while.     Pertinent History  2013 fall resulting in a femur break on the Rt leg resulting in multiple surgeries due to failures including hip replacements another broken tibia leading to the discovery of osteoporosis and a metabolic bone disease.  After forteo injections x 2 years he is  now in the osteopenia category.  Has a new scan coming up in a few weeks.  prior to the cancer diagnosis he was able to be confident with a cane.  Then was diagnosed with diffuse non-hogkin large cell B-lymphoma 07/09/18 located in tonsils and Rt groin. Treatment consists of an infusion every 3 weeks hopefully for a total of 5, A-fib controlled, HTN controlled with medication    Patient Stated Goals  get my balance back    Currently in Pain?  No/denies                       [facility] Adult PT Treatment/Exercise - 10/02/18 0001      Knee/Hip Exercises: Standing   Other Standing Knee Exercises  step back and weight shift onto left leg for quad activation  verbal and tactile cues to keep knee from hyperextension.     Other Standing Knee Exercises  standing mini knee bends 10 reps x3 with empahsis on keeping contol of knees to keep them from hyperextension       Knee/Hip Exercises: Supine   Terminal Knee Extension  Strengthening;Right;Left;10 reps    Terminal Knee Extension Limitations  cues for control       Shoulder Exercises: Supine   Protraction  Right;Left;10 reps;Weights    Protraction Weight (lbs)  4                  PT Long Term Goals - 09/28/18 1150       PT LONG TERM GOAL #1   Title  Pt will improve sit to stand x 30sec from 21" surface to 8 times     Baseline  09/28/2018  7 times in 30 sec.     Time  6    Period  Weeks    Status  On-going      PT LONG TERM GOAL #2   Title  Pt will improve [place] balance scale to at least 10/40    Time  6    Status  On-going      PT LONG TERM GOAL #3   Title  Pt will be ind with LRAD for home and community use    Baseline  09/28/2018 pt continues to use a RW , he has had episode of fall without hands on walker     Status  Partially Met      PT LONG TERM GOAL #4   Title  Pt will perform TUG with LRAD at 28sec or less     Baseline  on 09/28/2018  18.5 sec     Status  Achieved      PT LONG TERM GOAL #5   Title  Pt will report no instances of Rt LE giving out x 5 days at home     Baseline  still has difficulty with leg giving out     Time  6    Period  Weeks    Status  On-going      PT LONG TERM GOAL #6   Title  Pt be able to perform one SLR on left leg to 8 inches with no terminal extension lag     Time  6    Period  Weeks    Status  New            Plan - 10/02/18 1655    Clinical Impression Statement  Pt appears to have better contol of core activation today, but he still is anxious about  left knee buckling when he walks.  He continues to have dyspnea with exertion, but O2 sats and HR are good.  His final infusion is the first part of January and then he is hopeful he will make more improvements in strength     Rehab Potential  Good    PT Frequency  2x / week    PT Duration  6 weeks    PT Treatment/Interventions  ADLs/Self Care Home Management;Therapeutic exercise;Patient/family education;Gait training;Stair training;Balance training;Neuromuscular re-education    PT Next Visit Plan  continue to work on strength to left quads, right hip and core.  Activities for hip strategies for balance with cues to keep knees from locking out     Consulted and Agree with Plan of Care  Patient        Patient will benefit from skilled therapeutic intervention in order to improve the following deficits and impairments:  Abnormal gait, Decreased activity tolerance, Decreased endurance, Decreased strength, Decreased balance, Difficulty walking  Visit Diagnosis: Other abnormalities of gait and mobility  History of falling  Muscle weakness (generalized)     Problem List Patient Active Problem List   Diagnosis Date Noted  . Well adult exam 10/02/2018  . Counseling regarding advance care planning and goals of care 07/09/2018  . Diffuse large B-cell lymphoma ([place]) 06/23/2018  . Port-A-Cath in place 06/23/2018  . Hypogonadism in male 11/22/2017  . Nephrolithiasis 10/24/2017  . Recurrent dislocation of left patella 07/15/2017  . Recurrent subluxation of patella, left 07/08/2017  . S/P revision of total knee 04/11/2017  . Failed total knee, left, initial encounter ([place]) 04/09/2017  . Renal calculus, right 02/08/2017  . Protein-calorie malnutrition, severe ([place]) 12/08/2016  . Chronic obstructive pulmonary disease ([place]) 12/08/2016  . Staghorn renal calculus 11/05/2016  . Wound drainage right hip 11/15/2013  . Expected blood loss anemia 09/18/2013  . Hyponatremia 09/18/2013  . S/P right TH revision 09/17/2013  . Nonunion, fracture, Right tibia  07/03/2013  . UTI (urinary tract infection) 07/03/2013  . Pathological fracture due to osteoporosis with delayed healing 04/24/2013  . Osteoporosis with fracture 04/24/2013  . Rash and nonspecific skin eruption 03/08/2013  . Syncope 02/21/2013  . PAF (paroxysmal atrial fibrillation) ([place]) 02/21/2013  . Chronic anticoagulation 02/21/2013  . Fall 02/08/2013  . Physical deconditioning 02/08/2013  . Low back pain radiating to both legs 02/08/2013  . Compression fracture of L1 lumbar vertebra (HCC) 02/08/2013  . Right tibial fracture 02/05/2013  . Obesity, unspecified 02/05/2013  . Osteomyelitis of right wrist ([place]) 11/02/2012  .  Anemia 10/06/2012  . HTN (hypertension) 10/03/2012  . Psoriasis 10/03/2012  . Dyslipidemia 10/03/2012  . Vitamin D deficiency 10/03/2012  . GERD (gastroesophageal reflux disease) 10/03/2012  . Hyperglycemia 10/03/2012  . Anxiety 10/03/2012  . Depression 10/03/2012  . CAD (coronary artery disease) 10/03/2012  . DJD (degenerative joint disease) 10/03/2012  . Chronic gingivitis 10/03/2012  . Low testosterone, possible hypogonadism 10/01/2012  . Normocytic anemia 09/30/2012  . Right femoral fracture ([place]) 09/29/2012  . Right forearm fracture 09/29/2012  . Atrial fibrillation ([place]) 09/29/2012  . Hypothyroidism 09/29/2012  . History of recurrent UTIs 09/29/2012   Donato Heinz. Owens Shark PT  Norwood Levo 10/02/2018, 4:59 PM  [place], [place], [address] Phone: [phone]   Fax:  [phone]  Name: [name] MRN: [id number] Date of Birth: Sep 04, 1946

## 2018-10-02 NOTE — Progress Notes (Signed)
Subjective:  Patient ID: [name], male    DOB: 02-14-1946  Age: 72 y.o. MRN: [id number]  CC: No chief complaint on file.   HPI DELYNN PURSLEY presents for a well exam  Outpatient Medications Prior to Visit  Medication Sig Dispense Refill  . alfuzosin (UROXATRAL) 10 MG 24 hr tablet Take 10 mg by mouth at bedtime.    [name] amoxicillin (AMOXIL) 500 MG capsule Take 2,000 mg by mouth See admin instructions. Takes 4 capsules 1 hour prior to dental procedures    . atorvastatin (LIPITOR) 80 MG tablet Take 80 mg by mouth daily.    . BELSOMRA 15 MG TABS Take 1 tablet by mouth daily. 30 tablet 3  . buPROPion (WELLBUTRIN XL) 300 MG 24 hr tablet TAKE 1 TABLET DAILY WITH  BREAKFAST BY MOUTH. (Patient taking differently: Take 300 mg by mouth daily. ) 90 tablet 3  . Calcium Citrate-Vitamin D (CALCIUM CITRATE + D PO) Take 1 tablet by mouth 2 (two) times daily.     . Cholecalciferol (VITAMIN D-3 PO) Take 2,000 Units by mouth 2 (two) times daily.     . clonazePAM (KLONOPIN) 1 MG tablet Take 1 tablet (1 mg total) by mouth every 6 (six) hours as needed for anxiety. TAKE 1 TABLET BY MOUTH  EVERY 6 HOURS prn 360 tablet 1  . denosumab (PROLIA) 60 MG/ML SOLN injection Inject 60 mg into the skin every 6 (six) months. Administer in upper arm, thigh, or abdomen    . diltiazem (CARDIZEM CD) 240 MG 24 hr capsule TAKE 1 CAPSULE BY MOUTH  DAILY 90 capsule 3  . doxycycline (VIBRA-TABS) 100 MG tablet TAKE 1 TABLET BY MOUTH  DAILY 90 tablet 1  . DULoxetine (CYMBALTA) 60 MG capsule TAKE 1 CAPSULE BY MOUTH  TWICE DAILY 180 capsule 1  . EPINEPHrine (EPIPEN 2-PAK) 0.3 mg/0.3 mL IJ SOAJ injection Inject 0.3 mg into the muscle daily as needed (for anaphylactic reaction).    [name] etanercept (ENBREL) 50 MG/ML injection Inject 50 mg into the skin 2 (two) times a week. Tuesday & Friday    . ezetimibe (ZETIA) 10 MG tablet Take 1 tablet (10 mg total) by mouth daily. 90 tablet 3  . ferrous sulfate 325 (65 FE) MG tablet Take 325 mg  by mouth 2 (two) times daily with a meal.    . HYDROcodone-acetaminophen (NORCO) 7.5-325 MG tablet Take 1 tablet by mouth 4 (four) times daily as needed for moderate pain. Please fill on 11/02/18 120 tablet 0  . ipratropium (ATROVENT) 0.03 % nasal spray Place 1 spray into the nose daily as needed.    [name] levothyroxine (SYNTHROID, LEVOTHROID) 200 MCG tablet TAKE 1 TABLET DAILY AT 6 AM BY MOUTH. (Patient taking differently: Take 225 mcg by mouth daily before breakfast. ) 90 tablet 3  . levothyroxine (SYNTHROID, LEVOTHROID) 25 MCG tablet TAKE 1 TABLET BY MOUTH  DAILY BEFORE BREAKFAST. (Patient taking differently: Take 225 mcg by mouth daily before breakfast. ) 90 tablet 3  . lidocaine-prilocaine (EMLA) cream Apply to affected area once 30 g 3  . loratadine (CLARITIN) 10 MG tablet Take 10 mg by mouth daily.     [name] LORazepam (ATIVAN) 0.5 MG tablet Take 1 tablet (0.5 mg total) by mouth every 6 (six) hours as needed (Nausea or vomiting). 30 tablet 0  . metoprolol tartrate (LOPRESSOR) 25 MG tablet Take 1 tablet (25 mg total) by mouth 2 (two) times daily. 60 tablet 3  . mirabegron ER (MYRBETRIQ) 25  MG TB24 tablet Take 25 mg by mouth at bedtime.    . Multiple Vitamin (MULTIVITAMIN WITH MINERALS) TABS tablet Take 1 tablet by mouth daily. Men's One-A-Day 73+    . nitroGLYCERIN (NITROSTAT) 0.4 MG SL tablet Place 0.4 mg under the tongue every 5 (five) minutes as needed for chest pain. x3 doses as needed for chest pain    . omeprazole (PRILOSEC) 40 MG capsule TAKE 1 CAPSULE DAILY BEFORE BREAKFAST BY MOUTH. (Patient taking differently: Take 40 mg by mouth daily. ) 90 capsule 3  . ondansetron (ZOFRAN) 8 MG tablet Take 1 tablet (8 mg total) by mouth 2 (two) times daily as needed for refractory nausea / vomiting. chemotherapy. 30 tablet 1  . predniSONE (DELTASONE) 20 MG tablet Take 3 tablets (60 mg total) by mouth daily. Take on days 1-5 of chemotherapy. 50 tablet 0  . PROAIR HFA 108 (90 Base) MCG/ACT inhaler Inhale 1 puff  into the lungs every 4 (four) hours as needed.    . Probiotic Product (PROBIOTIC PO) Take 1 capsule by mouth daily with breakfast.    . prochlorperazine (COMPAZINE) 10 MG tablet Take 1 tablet (10 mg total) by mouth every 6 (six) hours as needed (Nausea or vomiting). 30 tablet 6  . testosterone enanthate (DELATESTRYL) 200 MG/ML injection INJECT 1ML (200MG  TOTAL) INTO THE MUSCLE EVERY 14 DAYS. FOR INTRAMUSCULAR USE ONLY. DISCARD VIAL AFTER 28 DAYS. 5 mL 4  . tiZANidine (ZANAFLEX) 4 MG tablet TAKE 1 TABLET BY MOUTH  EVERY 6 HOURS AS NEEDED FOR MUSCLE SPASM(S) 360 tablet 1  . triamcinolone ointment (KENALOG) 0.5 % APPLY TO AFFECTED AREA TWICE A DAY. 30 g 0  . vitamin C (ASCORBIC ACID) 500 MG tablet Take 500 mg by mouth daily.    Alveda Reasons 10 MG TABS tablet TAKE 1 TABLET DAILY BY  MOUTH. 90 tablet 3   No facility-administered medications prior to visit.     ROS: Review of Systems  Constitutional: Positive for fatigue. Negative for appetite change and unexpected weight change.  HENT: Negative for congestion, nosebleeds, sneezing, sore throat and trouble swallowing.   Eyes: Negative for itching and visual disturbance.  Respiratory: Negative for cough.   Cardiovascular: Negative for chest pain, palpitations and leg swelling.  Gastrointestinal: Negative for abdominal distention, blood in stool, diarrhea and nausea.  Genitourinary: Negative for frequency and hematuria.  Musculoskeletal: Positive for arthralgias, back pain and gait problem. Negative for joint swelling and neck pain.  Skin: Negative for rash.  Neurological: Positive for weakness. Negative for dizziness, tremors and speech difficulty.  Psychiatric/Behavioral: Negative for agitation, dysphoric mood and sleep disturbance. The patient is not nervous/anxious.     Objective:  BP 128/66 (BP Location: Left Arm, Patient Position: Sitting, Cuff Size: Large)   Pulse 78   Temp 98.3 F (36.8 C) (Oral)   Ht 6\' 3"  (1.905 m)   Wt (!) 302 lb  (137 kg)   SpO2 97%   BMI 37.75 kg/m   BP Readings from Last 3 Encounters:  10/02/18 128/66  09/19/18 123/80  09/19/18 133/67    Wt Readings from Last 3 Encounters:  10/02/18 (!) 302 lb (137 kg)  09/06/18 293 lb (132.9 kg)  08/29/18 298 lb 3.2 oz (135.3 kg)    Physical Exam Constitutional:      General: He is not in acute distress.    Appearance: He is well-developed.     Comments: NAD  Eyes:     Conjunctiva/sclera: Conjunctivae normal.     Pupils:  Pupils are equal, round, and reactive to light.  Neck:     Musculoskeletal: Normal range of motion.     Thyroid: No thyromegaly.     Vascular: No JVD.  Cardiovascular:     Rate and Rhythm: Normal rate and regular rhythm.     Heart sounds: Normal heart sounds. No murmur. No friction rub. No gallop.   Pulmonary:     Effort: Pulmonary effort is normal. No respiratory distress.     Breath sounds: Normal breath sounds. No wheezing or rales.  Chest:     Chest wall: No tenderness.  Abdominal:     General: Bowel sounds are normal. There is no distension.     Palpations: Abdomen is soft. There is no mass.     Tenderness: There is no abdominal tenderness. There is no guarding or rebound.  Musculoskeletal: Normal range of motion.        General: Tenderness present.  Lymphadenopathy:     Cervical: No cervical adenopathy.  Skin:    General: Skin is warm and dry.     Findings: No rash.  Neurological:     Mental Status: He is alert and oriented to person, place, and time.     Cranial Nerves: No cranial nerve deficit.     Motor: Weakness present. No abnormal muscle tone.     Coordination: Coordination normal.     Gait: Gait abnormal.     Deep Tendon Reflexes: Reflexes are normal and symmetric.  Psychiatric:        Behavior: Behavior normal.        Thought Content: Thought content normal.        Judgment: Judgment normal.   walker LS tender  Ataxic, weak legs  Lab Results  Component Value Date   WBC 7.9 09/19/2018   HGB  12.4 (L) 09/19/2018   HCT 38.3 (L) 09/19/2018   PLT 155 09/19/2018   GLUCOSE 166 (H) 09/19/2018   CHOL 144 07/13/2018   TRIG 72 07/13/2018   HDL 47 07/13/2018   LDLCALC 83 07/13/2018   ALT 25 09/19/2018   AST 20 09/19/2018   NA 143 09/19/2018   K 4.9 09/19/2018   CL 108 09/19/2018   CREATININE 1.23 09/19/2018   BUN 30 (H) 09/19/2018   CO2 27 09/19/2018   TSH 1.71 08/16/2017   PSA 1.12 02/20/2018   INR 1.02 06/22/2018   HGBA1C 5.8 (H) 02/06/2013    Nm Pet Image Restag (ps) Skull Base To Thigh  Result Date: 08/22/2018 CLINICAL DATA:  Subsequent treatment strategy for diffuse large B-cell lymphoma. EXAM: NUCLEAR MEDICINE PET SKULL BASE TO THIGH TECHNIQUE: 15.1 mCi F-18 FDG was injected intravenously. Full-ring PET imaging was performed from the skull base to thigh after the radiotracer. CT data was obtained and used for attenuation correction and anatomic localization. Fasting blood glucose: 93 mg/dl COMPARISON:  06/16/2018 FINDINGS: Mediastinal blood pool activity: SUV max 2.51 Liver activity: SUV max 3.96. NECK: The left palatine tonsillar mass has decreased in size in the interval. SUV max is equal to 3.91. Previously 19.8. Decreased FDG uptake associated with the right tonsillar region with SUV max of 3.58. Previously 9.8. Resolution of previous hypermetabolic left level 2 lymph nodes. Previously noted large lymph node superficial to the right sternocleidomastoid muscle 2.2 cm short axis and has an SUV max of 2.4. Previously this measured 2.3 cm within SUV max of 2.9. Incidental CT findings: none CHEST: No hypermetabolic axillary, supraclavicular, mediastinal, or hilar lymph nodes. No pleural effusions.  No hypermetabolic pulmonary nodules. Incidental CT findings: Aortic atherosclerosis. Calcifications in the LAD, left circumflex and RCA coronary artery noted. ABDOMEN/PELVIS: There is no abnormal radiotracer uptake identified within the liver, pancreas, spleen, or adrenal glands. No  hypermetabolic abdominal lymph nodes. Index right inguinal lymph node has resolved in the interval. The previous index left inguinal lymph node has also resolved in the interval. Incidental CT findings: Aortic atherosclerosis. No aneurysm. Bilateral nonobstructing renal calculi. Fat containing umbilical hernia noted. SKELETON: Again noted is a right hip arthroplasty device with cerclage wires and exuberant bone along the proximal stem. Increased radiotracer uptake within this area is again noted within SUV max of 12.25. Incidental CT findings: Vertebra plana deformity involving L1. Chronic right rib fractures. IMPRESSION: 1. Interval response to therapy. Overall Deauville criteria 3. Interval decrease in size and FDG uptake associated with palatine tonsil lesions. There has been resolution of previous left level-II and bilateral inguinal lymph nodes. 2. Enlarged subcutaneous lesion superficial to the lower right sternocleidomastoid muscle exhibits decreased FDG uptake. Currently Deauville criteria 2. 3. No new or progressive sites of disease identified. 4.  Aortic Atherosclerosis (ICD10-I70.0). 5. Multi vessel coronary artery atherosclerotic calcifications. 6. Unchanged vertebra plana deformity. 7. Kidney stones. Electronically Signed   By: Kerby Moors M.D.   On: 08/22/2018 10:14    Assessment & Plan:   There are no diagnoses linked to this encounter.   No orders of the defined types were placed in this encounter.    Follow-up: No follow-ups on file.  Walker Kehr, MD

## 2018-10-03 ENCOUNTER — Other Ambulatory Visit: Payer: Medicare Other

## 2018-10-03 DIAGNOSIS — R300 Dysuria: Secondary | ICD-10-CM

## 2018-10-05 ENCOUNTER — Ambulatory Visit: Payer: Self-pay | Admitting: *Deleted

## 2018-10-05 ENCOUNTER — Encounter: Payer: Self-pay | Admitting: Rehabilitation

## 2018-10-05 ENCOUNTER — Other Ambulatory Visit: Payer: Self-pay | Admitting: Hematology

## 2018-10-05 ENCOUNTER — Ambulatory Visit: Payer: Medicare Other | Admitting: Rehabilitation

## 2018-10-05 DIAGNOSIS — M6281 Muscle weakness (generalized): Secondary | ICD-10-CM

## 2018-10-05 DIAGNOSIS — R2689 Other abnormalities of gait and mobility: Secondary | ICD-10-CM

## 2018-10-05 DIAGNOSIS — C8330 Diffuse large B-cell lymphoma, unspecified site: Secondary | ICD-10-CM

## 2018-10-05 DIAGNOSIS — Z9181 History of falling: Secondary | ICD-10-CM

## 2018-10-05 NOTE — Therapy (Signed)
[place], [place], [address] Phone: [phone]   Fax:  [phone]  Physical Therapy Treatment  Patient Details  Name: [name] MRN: [id number] Date of Birth: 1946-04-13 Referring Provider (PT): Dr. Irene Limbo   Encounter Date: 10/05/2018  PT End of Session - 10/05/18 1307    Visit Number  12    Number of Visits  24    Date for PT Re-Evaluation  11/10/18    PT Start Time  1607    PT Stop Time  1345    PT Time Calculation (min)  40 min    Activity Tolerance  Patient tolerated treatment well;Patient limited by fatigue    Behavior During Therapy  [facility] for tasks assessed/performed       Past Medical History:  Diagnosis Date  . A-fib (HCC)    hx.- sinus rhythm now  . Acid reflux   . AKI (acute kidney injury) (HCC)    mild  . Anxiety   . Arthritis   . Atrial dilatation, bilateral    Severly, enlargement  per ECHO 11/03/16  . BPH (benign prostatic hyperplasia)   . CAD (coronary artery disease)   . Depression   . Dislocation of internal right hip prosthesis, sequela   . Dysrhythmia    hx a fib  . Elevated brain natriuretic peptide (BNP) level   . Environmental allergies   . Fracture of wrist 10/23/13   LEFT  . High cholesterol   . History of Graves' disease   . History of kidney stones    surg. removal 02/2017  . History of syncope   . HLD (hyperlipidemia)   . Hypertension   . Hypothyroidism    thyroid removed  . Left knee injury    cap dislocated  . Low testosterone, possible hypogonadism 10/01/2012  . Lumbago   . Metabolic bone disease   . Multiple falls   . Osteoporosis    Severe  . Plaque psoriasis   . Sleep apnea    cpap machine-settings 17  . Thrombocytopenia ([place])   . Thyroid disease    HX GRAVES DISEASE  . Transfusion history    s/p 12'13 hip surgery  . Unsteady gait   . UTI (urinary tract infection)   . Vertebral compression fracture (HCC)    L1-wears brace     Past Surgical History:  Procedure Laterality Date  . APPENDECTOMY    . CARDIAC CATHETERIZATION  2013  . CATARACT EXTRACTION, BILATERAL    . COLONOSCOPY    . CYSTOSCOPY WITH RETROGRADE PYELOGRAM, URETEROSCOPY AND STENT PLACEMENT Left 11/07/2017   Procedure: CYSTOSCOPY WITH RETROGRADE PYELOGRAM, URETEROSCOPY AND STENT PLACEMENT;  Surgeon: Cleon Gustin, MD;  Location: [facility];  Service: Urology;  Laterality: Left;  . CYSTOSCOPY/URETEROSCOPY/HOLMIUM LASER/STENT PLACEMENT Right 03/11/2017   Procedure: CYSTOSCOPY/URETEROSCOPYSTENT PLACEMENT right ureter retrograde pylegram;  Surgeon: Cleon Gustin, MD;  Location: [facility];  Service: Urology;  Laterality: Right;  . HARDWARE REMOVAL  10/05/2012   Procedure: HARDWARE REMOVAL;  Surgeon: [name], MD;  Location: [facility];  Service: Orthopedics;  Laterality: Right;  REMOVING  STRYKER  GAMMA NAIL  . HARDWARE REMOVAL Right 07/03/2013   Procedure: HARDWARE REMOVAL RIGHT TIBIA ;  Surgeon: Rozanna Box, MD;  Location: [place];  Service: Orthopedics;  Laterality: Right;  . HIP CLOSED REDUCTION Right 10/15/2013   Procedure: CLOSED MANIPULATION HIP;  Surgeon: [name], MD;  Location: [facility];  Service: Orthopedics;  Laterality: Right;  .  hip revision     June 2017  . HOLMIUM LASER APPLICATION Right 0/0/1749   Procedure: HOLMIUM LASER APPLICATION;  Surgeon: Cleon Gustin, MD;  [facility];  Service: Urology;  Laterality: Right;  . INCISION AND DRAINAGE HIP Right 11/16/2013   Procedure: IRRIGATION AND DEBRIDEMENT RIGHT HIP;  Surgeon: [name], MD;  [facility]: [facility];  Service: Orthopedics;  Laterality: Right;  . IR IMAGING GUIDED PORT INSERTION  06/22/2018  . IR URETERAL STENT LEFT NEW ACCESS W/O SEP NEPHROSTOMY CATH  10/24/2017  . JOINT REPLACEMENT     hip-right x2  . KNEE SURGERY Bilateral 2012   Total knee replacements  . NEPHROLITHOTOMY Right 02/08/2017   Procedure: NEPHROLITHOTOMY PERCUTANEOUS WITH SURGEON ACCESS;  Surgeon:  Cleon Gustin, MD;  [facility];  Service: Urology;  Laterality: Right;  . NEPHROLITHOTOMY Left 10/24/2017   Procedure: NEPHROLITHOTOMY PERCUTANEOUS;  Surgeon: Cleon Gustin, MD;  [facility];  Service: Urology;  Laterality: Left;  . ORIF TIBIA FRACTURE Right 02/06/2013   Procedure: OPEN REDUCTION INTERNAL FIXATION (ORIF) TIBIA FRACTURE WITH IM ROD FIBULA;  Surgeon: Rozanna Box, MD;  Location: [place];  Service: Orthopedics;  Laterality: Right;  . ORIF TIBIA FRACTURE Right 07/03/2013   Procedure: RIGHT TIBIA NON UNION REPAIR ;  Surgeon: Rozanna Box, MD;  Location: [place];  Service: Orthopedics;  Laterality: Right;  . ORIF WRIST FRACTURE  10/02/2012   Procedure: OPEN REDUCTION INTERNAL FIXATION (ORIF) WRIST FRACTURE;  Surgeon: Roseanne Kaufman, MD;  [facility];  Service: Orthopedics;  Laterality: Right;  WITH   ANTIBIOTIC  CEMENT  . ORIF WRIST FRACTURE Left 10/28/2013   Procedure: OPEN REDUCTION INTERNAL FIXATION (ORIF) WRIST FRACTURE with allograft;  Surgeon: Roseanne Kaufman, MD;  [facility];  Service: Orthopedics;  Laterality: Left;  DVR Plate  . QUADRICEPS TENDON REPAIR Left 07/15/2017   Procedure: REPAIR QUADRICEP TENDON;  Surgeon: Frederik Pear, MD;  Location: [place];  Service: Orthopedics;  Laterality: Left;  . right femur surgery  05/2012  . THYROIDECTOMY  02/1986  . TOTAL HIP REVISION  10/05/2012   Procedure: TOTAL HIP REVISION;  Surgeon: [name], MD;  [facility];  Service: Orthopedics;  Laterality: Right;  RIGHT TOTAL HIP REVISION  . TOTAL HIP REVISION Right 09/17/2013   Procedure: REVISION RIGHT TOTAL HIP ARTHROPLASTY ;  Surgeon: [name], MD;  [facility];  Service: Orthopedics;  Laterality: Right;  . TOTAL HIP REVISION Right 10/26/2013   Procedure: REVISION RIGHT TOTAL HIP ARTHROPLASTY;  Surgeon: [name], MD;  [facility];  Service: Orthopedics;  Laterality: Right;  . TOTAL HIP REVISION  03/2016  . TOTAL KNEE REVISION Left  04/11/2017   Procedure: TOTAL KNEE REVISION PATELLA and TIBIA;  Surgeon: Frederik Pear, MD;  Location: [place];  Service: Orthopedics;  Laterality: Left;  . WRIST FRACTURE SURGERY  05/2012    There were no vitals filed for this visit.  Subjective Assessment - 10/05/18 1306    Subjective  Still good and bad days.  I think its mostly mental.  1 infusion left     Pertinent History  2013 fall resulting in a femur break on the Rt leg resulting in multiple surgeries due to failures including hip replacements another broken tibia leading to the discovery of osteoporosis and a metabolic bone disease.  After forteo injections x 2 years he is now in the osteopenia category.  Has a new scan coming up in a few  weeks.  prior to the cancer diagnosis he was able to be confident with a cane.  Then was diagnosed with diffuse non-hogkin large cell B-lymphoma 07/09/18 located in tonsils and Rt groin. Treatment consists of an infusion every 3 weeks hopefully for a total of 5, A-fib controlled, HTN controlled with medication    Limitations  Walking;Standing;House hold activities    Patient Stated Goals  get my balance back    Currently in Pain?  No/denies                       [facility] Adult PT Treatment/Exercise - 10/05/18 0001      Ambulation/Gait   Pre-Gait Activities  in parallel bars: fwd and backward steps bil without hands with pt almost falling x 1 then use of hands as needed and CGA.  Vcs to keep Rt knee straight.  Better when holding on.  standing without holding 10"x 3 then mini knee bends x 5 and weight shift R/L x 10, march holding on x 20    Gait Comments  gait with RW over to the parallel bars to work on balance       Knee/Hip Exercises: Seated   Long Arc Quad  10 reps    Long Arc Quad Weight  2 lbs.    Long Arc Quad Limitations  2 sets of 5                   PT Long Term Goals - 09/28/18 1150      PT LONG TERM GOAL #1   Title  Pt will improve sit to stand x 30sec from 21"  surface to 8 times     Baseline  09/28/2018  7 times in 30 sec.     Time  6    Period  Weeks    Status  On-going      PT LONG TERM GOAL #2   Title  Pt will improve [place] balance scale to at least 10/40    Time  6    Status  On-going      PT LONG TERM GOAL #3   Title  Pt will be ind with LRAD for home and community use    Baseline  09/28/2018 pt continues to use a RW , he has had episode of fall without hands on walker     Status  Partially Met      PT LONG TERM GOAL #4   Title  Pt will perform TUG with LRAD at 28sec or less     Baseline  on 09/28/2018  18.5 sec     Status  Achieved      PT LONG TERM GOAL #5   Title  Pt will report no instances of Rt LE giving out x 5 days at home     Baseline  still has difficulty with leg giving out     Time  6    Period  Weeks    Status  On-going      PT LONG TERM GOAL #6   Title  Pt be able to perform one SLR on left leg to 8 inches with no terminal extension lag     Time  6    Period  Weeks    Status  New            Plan - 10/05/18 1326    Clinical Impression Statement  Pts first attempt at parallel bars today with more anxiety  as the support is different than the walker.  bilateral knees buckling when attempting to stand with pt catching himself on the parallel bars.  Decreased ability to stop Lt knee buckling when stepping backwards to meed the other foot.  Pt with more fatigue and SOB to this PT from last visit with him.      PT Frequency  2x / week    PT Duration  6 weeks    PT Treatment/Interventions  ADLs/Self Care Home Management;Therapeutic exercise;Patient/family education;Gait training;Stair training;Balance training;Neuromuscular re-education    PT Next Visit Plan  continue to work on strength to left quads, right hip and core.  Activities for hip strategies for balance with cues to keep knees from locking out     Consulted and Agree with Plan of Care  Patient       Patient will benefit from skilled therapeutic  intervention in order to improve the following deficits and impairments:  Abnormal gait, Decreased activity tolerance, Decreased endurance, Decreased strength, Decreased balance, Difficulty walking  Visit Diagnosis: Other abnormalities of gait and mobility  History of falling  Muscle weakness (generalized)  Diffuse large B-cell lymphoma, unspecified body region [facility])     Problem List Patient Active Problem List   Diagnosis Date Noted  . Well adult exam 10/02/2018  . Counseling regarding advance care planning and goals of care 07/09/2018  . Diffuse large B-cell lymphoma ([place]) 06/23/2018  . Port-A-Cath in place 06/23/2018  . Hypogonadism in male 11/22/2017  . Nephrolithiasis 10/24/2017  . Recurrent dislocation of left patella 07/15/2017  . Recurrent subluxation of patella, left 07/08/2017  . S/P revision of total knee 04/11/2017  . Failed total knee, left, initial encounter ([place]) 04/09/2017  . Renal calculus, right 02/08/2017  . Protein-calorie malnutrition, severe ([place]) 12/08/2016  . Chronic obstructive pulmonary disease ([place]) 12/08/2016  . Staghorn renal calculus 11/05/2016  . Wound drainage right hip 11/15/2013  . Expected blood loss anemia 09/18/2013  . Hyponatremia 09/18/2013  . S/P right TH revision 09/17/2013  . Nonunion, fracture, Right tibia  07/03/2013  . UTI (urinary tract infection) 07/03/2013  . Pathological fracture due to osteoporosis with delayed healing 04/24/2013  . Osteoporosis with fracture 04/24/2013  . Rash and nonspecific skin eruption 03/08/2013  . Syncope 02/21/2013  . PAF (paroxysmal atrial fibrillation) ([place]) 02/21/2013  . Chronic anticoagulation 02/21/2013  . Fall 02/08/2013  . Physical deconditioning 02/08/2013  . Low back pain radiating to both legs 02/08/2013  . Compression fracture of L1 lumbar vertebra (HCC) 02/08/2013  . Right tibial fracture 02/05/2013  . Obesity, unspecified 02/05/2013  . Osteomyelitis of right wrist ([place]) 11/02/2012   . Anemia 10/06/2012  . HTN (hypertension) 10/03/2012  . Psoriasis 10/03/2012  . Dyslipidemia 10/03/2012  . Vitamin D deficiency 10/03/2012  . GERD (gastroesophageal reflux disease) 10/03/2012  . Hyperglycemia 10/03/2012  . Anxiety 10/03/2012  . Depression 10/03/2012  . CAD (coronary artery disease) 10/03/2012  . DJD (degenerative joint disease) 10/03/2012  . Chronic gingivitis 10/03/2012  . Low testosterone, possible hypogonadism 10/01/2012  . Normocytic anemia 09/30/2012  . Right femoral fracture ([place]) 09/29/2012  . Right forearm fracture 09/29/2012  . Atrial fibrillation ([place]) 09/29/2012  . Hypothyroidism 09/29/2012  . History of recurrent UTIs 09/29/2012    Shan Levans, PT 10/05/2018, 1:45 PM  [place], [place], [address] Phone: [phone]   Fax:  [phone]  Name: [name] MRN: [id number] Date of Birth: 1946/06/09

## 2018-10-05 NOTE — Telephone Encounter (Signed)
Patient called to say that his BELSOMRA 15 MG TABS was ordered in a 30 day supply it should be 90 days for cost effectiveness. Requesting a new Rx to be sent to [facility], [place] [phone] (Phone) [phone] (Fax)  Please assist. Thank you  Reason for Disposition . Caller has NON-URGENT medication question about med that PCP prescribed and triager unable to answer question  Protocols used: MEDICATION QUESTION CALL-A-AH

## 2018-10-06 LAB — URINE CULTURE
MICRO NUMBER:: 91538702
SPECIMEN QUALITY:: ADEQUATE

## 2018-10-06 MED ORDER — BELSOMRA 15 MG PO TABS: 1.0000 | ORAL_TABLET | Freq: Every day | ORAL | 1 refills | Status: DC

## 2018-10-06 NOTE — Telephone Encounter (Signed)
Ok thx.

## 2018-10-06 NOTE — Addendum Note (Signed)
Addended by: Karren Cobble on: 10/06/2018 09:05 AM   Modules accepted: Orders

## 2018-10-06 NOTE — Addendum Note (Signed)
Addended by: Cassandria Anger on: 10/06/2018 11:37 AM   Modules accepted: Orders

## 2018-10-09 ENCOUNTER — Ambulatory Visit: Payer: Medicare Other

## 2018-10-10 NOTE — Progress Notes (Signed)
HEMATOLOGY/ONCOLOGY CLINIC NOTE  Date of Service: 10/12/2018  Patient Care Team: Plotnikov, Evie Lacks, MD as PCP - General (Internal Medicine) Brunetta Genera, MD as Consulting Physician (Hematology) McKenzie, Candee Furbish, MD as Consulting Physician (Urology) Jackquline Denmark, MD as Consulting Physician (Gastroenterology)  CHIEF COMPLAINTS/PURPOSE OF CONSULTATION:  Follow Up for Diffuse Large B-Cell Lymphoma   HISTORY OF PRESENTING ILLNESS:   [name] is a wonderful 72 y.o. male who has been referred to [place] by Dr. Lew Dawes for evaluation and management of Diffuse Large B-Cell Lymphoma. He is accompanied today by his partner. The pt reports that he is doing well overall.   The pt reports that he presented to ENT Dr. Pollie Friar care after developing a cough and a new nodule in his mouth. He notes that the symptoms presented about a month ago and have not progressed or significantly worsened. He notes that he has frequent sinus drainage, and felt that this enlargement could initially be related to this. He denies any changes in his voice and difficulty breathing.   The pt notes that he is finishing three months of PT tomorrow after surgeries on his right leg and left knee. He notes that he takes blood thinners for Afib and uses his CPAP regularly now. The pt also receives testosterone injections, 200mg  every other week. The pt notes that he had several surgeries bilaterally to remove large kidney stones in the past. He notes that his thyroid replacement has been stable. His vertebral compression fracture was in 2013, after a fall in which he also broke his right femur and wrist. The pt also takes Prolia every 6 months for his osteoporosis. He has had 18 surgeries on his right leg.   He notes that his psoriasis has been stable and takes Enbrel twice a week, and has taken this for 5 years. He was on 10 years of Humara before this. He was on Methotrexate prior to [facility].   The pt  notes that he functions okay at home and is able to drive himself and cook for himself and feels that he can function independently with limitations on lifting heavier objects.   On review of systems, pt reports recent cough, left tonsil mass, stable energy levels, and denies fevers, chills, night sweats, unexpected weight loss, difficulty breathing, difficulty swallowing, noticing any other lumps or bumps, pain along the spine, recent infections, abdominal pains, difficulty urinating, leg swelling, and any other symptoms.  . On Social Hx the pt reports that he smoked cigarettes for many years until he quit 6 years ago, and drinks ETOH socially.    INTERVAL HISTORY   [name] is here for management, evaluation, and C6D1 R-CHOP treatment of his Diffuse Large B-Cell Lymphoma. The patient's last visit with [place] was on 09/19/18. He is accompanied today by his partner. The pt reports that he is doing well overall.   The pt reports that he did have a fall in the interim, and denies sustaining any injuries or concerns as a result of this. He notes that his foot slipped when he was stepping out of the shower, and denies any light headedness or dizziness. The pt has continued to attend PT and notes it is "wonderful."   The pt notes that he has drastically reduced his diet coke consumption to once a day and is consuming water the rest of the day.   The pt notes that he has continued testosterone replacement every 3-6 months with his PCP.  Lab results today (10/12/18) of CBC w/diff and CMP is as follows: all values are WNL except for RBC at 3.41, HGB at 11.0, HCT at 34.3, MCV at 100.6, RDW at 18.6, Glucose at 117, BUN at 24, Total Protein at 6.2, Albumin at 3.2. 10/12/18 Magnesium at 1.6 10/12/18 LDH is at 197  On review of systems, pt reports staying well hydrated, stalbe energy levels, increasing physical activity, and denies concerns for infections, fevers, chills, abdominal pains, and any other  symptoms.   MEDICAL HISTORY:  Past Medical History:  Diagnosis Date  . A-fib (HCC)    hx.- sinus rhythm now  . Acid reflux   . AKI (acute kidney injury) (HCC)    mild  . Anxiety   . Arthritis   . Atrial dilatation, bilateral    Severly, enlargement  per ECHO 11/03/16  . BPH (benign prostatic hyperplasia)   . CAD (coronary artery disease)   . Depression   . Dislocation of internal right hip prosthesis, sequela   . Dysrhythmia    hx a fib  . Elevated brain natriuretic peptide (BNP) level   . Environmental allergies   . Fracture of wrist 10/23/13   LEFT  . High cholesterol   . History of Graves' disease   . History of kidney stones    surg. removal 02/2017  . History of syncope   . HLD (hyperlipidemia)   . Hypertension   . Hypothyroidism    thyroid removed  . Left knee injury    cap dislocated  . Low testosterone, possible hypogonadism 10/01/2012  . Lumbago   . Metabolic bone disease   . Multiple falls   . Osteoporosis    Severe  . Plaque psoriasis   . Sleep apnea    cpap machine-settings 17  . Thrombocytopenia ([place])   . Thyroid disease    HX GRAVES DISEASE  . Transfusion history    s/p 12'13 hip surgery  . Unsteady gait   . UTI (urinary tract infection)   . Vertebral compression fracture (HCC)    L1-wears brace    SURGICAL HISTORY: Past Surgical History:  Procedure Laterality Date  . APPENDECTOMY    . CARDIAC CATHETERIZATION  2013  . CATARACT EXTRACTION, BILATERAL    . COLONOSCOPY    . CYSTOSCOPY WITH RETROGRADE PYELOGRAM, URETEROSCOPY AND STENT PLACEMENT Left 11/07/2017   Procedure: CYSTOSCOPY WITH RETROGRADE PYELOGRAM, URETEROSCOPY AND STENT PLACEMENT;  Surgeon: Cleon Gustin, MD;  Location: [facility];  Service: Urology;  Laterality: Left;  . CYSTOSCOPY/URETEROSCOPY/HOLMIUM LASER/STENT PLACEMENT Right 03/11/2017   Procedure: CYSTOSCOPY/URETEROSCOPYSTENT PLACEMENT right ureter retrograde pylegram;  Surgeon: Cleon Gustin, MD;  Location: [facility];   Service: Urology;  Laterality: Right;  . HARDWARE REMOVAL  10/05/2012   Procedure: HARDWARE REMOVAL;  Surgeon: [name], MD;  [facility];  Service: Orthopedics;  Laterality: Right;  REMOVING  STRYKER  GAMMA NAIL  . HARDWARE REMOVAL Right 07/03/2013   Procedure: HARDWARE REMOVAL RIGHT TIBIA ;  Surgeon: Rozanna Box, MD;  Location: [place];  Service: Orthopedics;  Laterality: Right;  . HIP CLOSED REDUCTION Right 10/15/2013   Procedure: CLOSED MANIPULATION HIP;  Surgeon: [name], MD;  [facility];  Service: Orthopedics;  Laterality: Right;  . hip revision     June 2017  . HOLMIUM LASER APPLICATION Right 06/17/262   Procedure: HOLMIUM LASER APPLICATION;  Surgeon: Cleon Gustin, MD;  [facility];  Service: Urology;  Laterality: Right;  . INCISION AND DRAINAGE HIP  Right 11/16/2013   Procedure: IRRIGATION AND DEBRIDEMENT RIGHT HIP;  Surgeon: [name], MD;  [facility];  Service: Orthopedics;  Laterality: Right;  . IR IMAGING GUIDED PORT INSERTION  06/22/2018  . IR URETERAL STENT LEFT NEW ACCESS W/O SEP NEPHROSTOMY CATH  10/24/2017  . JOINT REPLACEMENT     hip-right x2  . KNEE SURGERY Bilateral 2012   Total knee replacements  . NEPHROLITHOTOMY Right 02/08/2017   Procedure: NEPHROLITHOTOMY PERCUTANEOUS WITH SURGEON ACCESS;  Surgeon: Cleon Gustin, MD;  [facility];  Service: Urology;  Laterality: Right;  . NEPHROLITHOTOMY Left 10/24/2017   Procedure: NEPHROLITHOTOMY PERCUTANEOUS;  Surgeon: Cleon Gustin, MD;  [facility];  Service: Urology;  Laterality: Left;  . ORIF TIBIA FRACTURE Right 02/06/2013   Procedure: OPEN REDUCTION INTERNAL FIXATION (ORIF) TIBIA FRACTURE WITH IM ROD FIBULA;  Surgeon: Rozanna Box, MD;  Location: [place];  Service: Orthopedics;  Laterality: Right;  . ORIF TIBIA FRACTURE Right 07/03/2013   Procedure: RIGHT TIBIA NON UNION REPAIR ;  Surgeon: Rozanna Box, MD;  Location: [place];  Service: Orthopedics;  Laterality:  Right;  . ORIF WRIST FRACTURE  10/02/2012   Procedure: OPEN REDUCTION INTERNAL FIXATION (ORIF) WRIST FRACTURE;  Surgeon: Roseanne Kaufman, MD;  [facility];  Service: Orthopedics;  Laterality: Right;  WITH   ANTIBIOTIC  CEMENT  . ORIF WRIST FRACTURE Left 10/28/2013   Procedure: OPEN REDUCTION INTERNAL FIXATION (ORIF) WRIST FRACTURE with allograft;  Surgeon: Roseanne Kaufman, MD;  [facility];  Service: Orthopedics;  Laterality: Left;  DVR Plate  . QUADRICEPS TENDON REPAIR Left 07/15/2017   Procedure: REPAIR QUADRICEP TENDON;  Surgeon: Frederik Pear, MD;  Location: [place];  Service: Orthopedics;  Laterality: Left;  . right femur surgery  05/2012  . THYROIDECTOMY  02/1986  . TOTAL HIP REVISION  10/05/2012   Procedure: TOTAL HIP REVISION;  Surgeon: [name], MD;  [facility];  Service: Orthopedics;  Laterality: Right;  RIGHT TOTAL HIP REVISION  . TOTAL HIP REVISION Right 09/17/2013   Procedure: REVISION RIGHT TOTAL HIP ARTHROPLASTY ;  Surgeon: [name], MD;  [facility];  Service: Orthopedics;  Laterality: Right;  . TOTAL HIP REVISION Right 10/26/2013   Procedure: REVISION RIGHT TOTAL HIP ARTHROPLASTY;  Surgeon: [name], MD;  [facility];  Service: Orthopedics;  Laterality: Right;  . TOTAL HIP REVISION  03/2016  . TOTAL KNEE REVISION Left 04/11/2017   Procedure: TOTAL KNEE REVISION PATELLA and TIBIA;  Surgeon: Frederik Pear, MD;  Location: [place];  Service: Orthopedics;  Laterality: Left;  . WRIST FRACTURE SURGERY  05/2012    SOCIAL HISTORY: Social History   Socioeconomic History  . Marital status: Married    Spouse name: Not on file  . Number of children: Not on file  . Years of education: Not on file  . Highest education level: Not on file  Occupational History  . Not on file  Social Needs  . Financial resource strain: Not on file  . Food insecurity:    Worry: Not on file    Inability: Not on file  . Transportation needs:    Medical: Not on file     Non-medical: Not on file  Tobacco Use  . Smoking status: Former Smoker    Last attempt to quit: 06/05/2012    Years since quitting: 6.3  . Smokeless tobacco: Never Used  Substance and Sexual Activity  . Alcohol use: Yes  Comment: occasional-social  . Drug use: No  . Sexual activity: Yes  Lifestyle  . Physical activity:    Days per week: Not on file    Minutes per session: Not on file  . Stress: Not on file  Relationships  . Social connections:    Talks on phone: Not on file    Gets together: Not on file    Attends religious service: Not on file    Active member of club or organization: Not on file    Attends meetings of clubs or organizations: Not on file    Relationship status: Not on file  . Intimate partner violence:    Fear of current or ex partner: Not on file    Emotionally abused: Not on file    Physically abused: Not on file    Forced sexual activity: Not on file  Other Topics Concern  . Not on file  Social History Narrative   [place] 2.5 months, went home Feb 21st slipped and fell on back and developed.  Home PT/OT.  Just started outpatient physical therapy.  Friday night, misstepped.      FAMILY HISTORY: Family History  Problem Relation Age of Onset  . CAD Father 56  . Asthma Father   . Alcohol abuse Father   . Arthritis Mother   . Alcohol abuse Sister     ALLERGIES:  is allergic to short ragweed pollen ext.  MEDICATIONS:  Current Outpatient Medications  Medication Sig Dispense Refill  . alfuzosin (UROXATRAL) 10 MG 24 hr tablet Take 10 mg by mouth at bedtime.    [name] amoxicillin (AMOXIL) 500 MG capsule Take 2,000 mg by mouth See admin instructions. Takes 4 capsules 1 hour prior to dental procedures    . atorvastatin (LIPITOR) 80 MG tablet Take 80 mg by mouth daily.    . BELSOMRA 15 MG TABS Take 1 tablet by mouth daily. 90 tablet 1  . buPROPion (WELLBUTRIN XL) 300 MG 24 hr tablet TAKE 1 TABLET DAILY WITH  BREAKFAST BY MOUTH. (Patient taking differently:  Take 300 mg by mouth daily. ) 90 tablet 3  . Calcium Citrate-Vitamin D (CALCIUM CITRATE + D PO) Take 1 tablet by mouth 2 (two) times daily.     . Cholecalciferol (VITAMIN D-3 PO) Take 2,000 Units by mouth 2 (two) times daily.     . clonazePAM (KLONOPIN) 1 MG tablet Take 1 tablet (1 mg total) by mouth every 6 (six) hours as needed for anxiety. TAKE 1 TABLET BY MOUTH  EVERY 6 HOURS prn 360 tablet 1  . denosumab (PROLIA) 60 MG/ML SOLN injection Inject 60 mg into the skin every 6 (six) months. Administer in upper arm, thigh, or abdomen    . diltiazem (CARDIZEM CD) 240 MG 24 hr capsule TAKE 1 CAPSULE BY MOUTH  DAILY 90 capsule 3  . doxycycline (VIBRA-TABS) 100 MG tablet TAKE 1 TABLET BY MOUTH  DAILY 90 tablet 1  . DULoxetine (CYMBALTA) 60 MG capsule TAKE 1 CAPSULE BY MOUTH  TWICE DAILY 180 capsule 1  . EPINEPHrine (EPIPEN 2-PAK) 0.3 mg/0.3 mL IJ SOAJ injection Inject 0.3 mg into the muscle daily as needed (for anaphylactic reaction).    [name] etanercept (ENBREL) 50 MG/ML injection Inject 50 mg into the skin 2 (two) times a week. Tuesday & Friday    . ferrous sulfate 325 (65 FE) MG tablet Take 325 mg by mouth 2 (two) times daily with a meal.    . HYDROcodone-acetaminophen (NORCO) 7.5-325 MG tablet Take 1  tablet by mouth 4 (four) times daily as needed for moderate pain. Please fill on 11/02/18 120 tablet 0  . ipratropium (ATROVENT) 0.03 % nasal spray Place 1 spray into the nose daily as needed.    [name] levothyroxine (SYNTHROID, LEVOTHROID) 200 MCG tablet TAKE 1 TABLET DAILY AT 6 AM BY MOUTH. (Patient taking differently: Take 225 mcg by mouth daily before breakfast. ) 90 tablet 3  . lidocaine-prilocaine (EMLA) cream Apply to affected area once 30 g 3  . loratadine (CLARITIN) 10 MG tablet Take 10 mg by mouth daily.     [name] LORazepam (ATIVAN) 0.5 MG tablet Take 1 tablet (0.5 mg total) by mouth every 6 (six) hours as needed (Nausea or vomiting). 30 tablet 0  . mirabegron ER (MYRBETRIQ) 25 MG TB24 tablet Take 25 mg by  mouth at bedtime.    . Multiple Vitamin (MULTIVITAMIN WITH MINERALS) TABS tablet Take 1 tablet by mouth daily. Men's One-A-Day 24+    . nitroGLYCERIN (NITROSTAT) 0.4 MG SL tablet Place 0.4 mg under the tongue every 5 (five) minutes as needed for chest pain. x3 doses as needed for chest pain    . omeprazole (PRILOSEC) 40 MG capsule TAKE 1 CAPSULE DAILY BEFORE BREAKFAST BY MOUTH. (Patient taking differently: Take 40 mg by mouth daily. ) 90 capsule 3  . ondansetron (ZOFRAN) 8 MG tablet Take 1 tablet (8 mg total) by mouth 2 (two) times daily as needed for refractory nausea / vomiting. chemotherapy. 30 tablet 1  . predniSONE (DELTASONE) 20 MG tablet Take 3 tablets (60 mg total) by mouth daily. Take on days 1-5 of chemotherapy. 50 tablet 0  . PROAIR HFA 108 (90 Base) MCG/ACT inhaler Inhale 1 puff into the lungs every 4 (four) hours as needed.    . Probiotic Product (PROBIOTIC PO) Take 1 capsule by mouth daily with breakfast.    . prochlorperazine (COMPAZINE) 10 MG tablet Take 1 tablet (10 mg total) by mouth every 6 (six) hours as needed (Nausea or vomiting). 30 tablet 6  . testosterone enanthate (DELATESTRYL) 200 MG/ML injection INJECT 1ML (200MG  TOTAL) INTO THE MUSCLE EVERY 14 DAYS. FOR INTRAMUSCULAR USE ONLY. DISCARD VIAL AFTER 28 DAYS. 5 mL 4  . tiZANidine (ZANAFLEX) 4 MG tablet TAKE 1 TABLET BY MOUTH  EVERY 6 HOURS AS NEEDED FOR MUSCLE SPASM(S) 360 tablet 1  . triamcinolone ointment (KENALOG) 0.5 % APPLY TO AFFECTED AREA TWICE A DAY. 30 g 0  . vitamin C (ASCORBIC ACID) 500 MG tablet Take 500 mg by mouth daily.    Alveda Reasons 10 MG TABS tablet TAKE 1 TABLET DAILY BY  MOUTH. 90 tablet 3  . ezetimibe (ZETIA) 10 MG tablet Take 1 tablet (10 mg total) by mouth daily. 90 tablet 3  . levothyroxine (SYNTHROID, LEVOTHROID) 25 MCG tablet TAKE 1 TABLET BY MOUTH  DAILY BEFORE BREAKFAST. (Patient taking differently: Take 225 mcg by mouth daily before breakfast. ) 90 tablet 3  . metoprolol tartrate (LOPRESSOR) 25 MG  tablet Take 1 tablet (25 mg total) by mouth 2 (two) times daily. 60 tablet 3   No current facility-administered medications for this visit.     REVIEW OF SYSTEMS:    A 10+ POINT REVIEW OF SYSTEMS WAS OBTAINED including neurology, dermatology, psychiatry, cardiac, respiratory, lymph, extremities, GI, GU, Musculoskeletal, constitutional, breasts, reproductive, HEENT.  All pertinent positives are noted in the HPI.  All others are negative.   PHYSICAL EXAMINATION: ECOG PERFORMANCE STATUS: 2 - Symptomatic, <50% confined to bed  . Vitals:  10/12/18 1007  BP: 112/69  Pulse: 78  Resp: 18  Temp: (!) 97.5 F (36.4 C)  SpO2: 97%   Filed Weights   10/12/18 1007  Weight: (!) 306 lb 9.6 oz (139.1 kg)   .Body mass index is 38.32 kg/m.  GENERAL:alert, in no acute distress and comfortable SKIN: chronic venous stasis changes in BLE, psoriasis rashes on elbows EYES: conjunctiva are pink and non-injected, sclera anicteric OROPHARYNX: MMM, no exudates, no oropharyngeal erythema or ulceration NECK: supple, no JVD LYMPH: Couple palpable enlarged cervical lymph nodes b/l, no palpable lymphadenopathy in the axillary or inguinal regions LUNGS: clear to auscultation b/l with normal respiratory effort HEART: regular rate & rhythm ABDOMEN:  normoactive bowel sounds , non tender, not distended. No palpable hepatosplenomegaly.  Extremity: no pedal edema PSYCH: alert & oriented x 3 with fluent speech NEURO: no focal motor/sensory deficits   LABORATORY DATA:  I have reviewed the data as listed  . CBC Latest Ref Rng & Units 10/12/2018 10/02/2018 09/19/2018  WBC 4.0 - 10.5 K/uL 5.4 8.9 7.9  Hemoglobin 13.0 - 17.0 g/dL 11.0(L) 12.1(L) 12.4(L)  Hematocrit 39.0 - 52.0 % 34.3(L) 36.1(L) 38.3(L)  Platelets 150 - 400 K/uL 165 129.0(L) 155    . CMP Latest Ref Rng & Units 10/12/2018 10/02/2018 09/19/2018  Glucose 70 - 99 mg/dL 117(H) 121(H) 166(H)  BUN 8 - 23 mg/dL 24(H) 22 30(H)  Creatinine 0.61 - 1.24  mg/dL 1.10 1.11 1.23  Sodium 135 - 145 mmol/L 139 140 143  Potassium 3.5 - 5.1 mmol/L 4.2 4.2 4.9  Chloride 98 - 111 mmol/L 105 104 108  CO2 22 - 32 mmol/L 26 27 27   Calcium 8.9 - 10.3 mg/dL 9.2 8.9 9.2  Total Protein 6.5 - 8.1 g/dL 6.2(L) 5.9(L) 6.2(L)  Total Bilirubin 0.3 - 1.2 mg/dL 0.7 0.6 0.5  Alkaline Phos 38 - 126 U/L 52 58 58  AST 15 - 41 U/L 20 16 20   ALT 0 - 44 U/L 26 23 25    05/29/18 Left tonsil biopsy:   RADIOGRAPHIC STUDIES: I have personally reviewed the radiological images as listed and agreed with the findings in the report. No results found.  ASSESSMENT & PLAN:  72 y.o. male with  1. Diffuse Large B-Cell Lymphoma,- Stage III Left tonsil -05/29/18 Left tonsil biopsy which revealed Diffuse Large B-Cell Lymphoma, which requires treatment  -06/07/18 LDH at 214, will monitor change through treatment.  -Baseline ECHO 06/14/18: EF 50-55% -Standard regimen of R-CHOP with G-CSF support for 6 cycles starting 06/23/18  06/16/18 PET/CT revealed 1. Deauville 5 hypermetabolic lesions in the palatine tonsils, left internal jugular chain, and involving a right inguinal lymph node. Deauville 4 involvement of a left inguinal lymph node and an enlarged subcutaneous lesion favoring lymph node superficial to the lower right sternocleidomastoid muscle in the neck. 2. Other imaging findings of potential clinical significance: Aortic Atherosclerosis. Coronary atherosclerosis. Emphysema. Bilateral nonobstructive nephrolithiasis. Umbilical hernia contains adipose tissues. Old right rib fractures some of which are nonunited. 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   08/22/18 PET/CT revealed Interval response to therapy. Overall Deauville criteria 3. Interval decrease in size and FDG uptake associated with palatine tonsil lesions. There has been resolution of previous left level-II and bilateral inguinal lymph nodes. 2. Enlarged subcutaneous lesion superficial to the lower right sternocleidomastoid muscle exhibits  decreased FDG uptake. Currently Deauville criteria 2. 3. No new or progressive sites of disease identified. 4.  Aortic Atherosclerosis. 5. Multi vessel coronary artery atherosclerotic calcifications. 6. Unchanged vertebra plana deformity.  7. Kidney stones.   PLAN:  -Discussed pt labwork today, 10/12/18; blood counts and chemistries are stable, Magnesium slightly low at 1.6. LDH at 197.  -The pt has no prohibitive toxicities from continuing C6D1 R-CHOP at this time.   -Will order PO Magnesium replacement for one month -Will complete end of treatment PET/CT for treatment efficacy evaluation and new baseline  -Discussed that if other less suppressive options are available, recommend these over Enbrel for future psoriasis management  -Continue with Physical Therapy, this has been very helpful for the patient  -Continue salt and baking soda mouthwashes 3-4 times each day -Will continue to monitor mild neuropathy in fingertips -Pt holding Enbrel. I previously prescribed Kenalog for topical use for now. If needed he may restart Testosterone at low dose. Will monitor.  -Will see the pt back in 5 weeks  2.  Patient Active Problem List   Diagnosis Date Noted  . Well adult exam 10/02/2018  . Counseling regarding advance care planning and goals of care 07/09/2018  . Diffuse large B-cell lymphoma ([place]) 06/23/2018  . Port-A-Cath in place 06/23/2018  . Hypogonadism in male 11/22/2017  . Nephrolithiasis 10/24/2017  . Recurrent dislocation of left patella 07/15/2017  . Recurrent subluxation of patella, left 07/08/2017  . S/P revision of total knee 04/11/2017  . Failed total knee, left, initial encounter ([place]) 04/09/2017  . Renal calculus, right 02/08/2017  . Protein-calorie malnutrition, severe ([place]) 12/08/2016  . Chronic obstructive pulmonary disease ([place]) 12/08/2016  . Staghorn renal calculus 11/05/2016  . Wound drainage right hip 11/15/2013  . Expected blood loss anemia 09/18/2013  . Hyponatremia  09/18/2013  . S/P right TH revision 09/17/2013  . Nonunion, fracture, Right tibia  07/03/2013  . UTI (urinary tract infection) 07/03/2013  . Pathological fracture due to osteoporosis with delayed healing 04/24/2013  . Osteoporosis with fracture 04/24/2013  . Rash and nonspecific skin eruption 03/08/2013  . Syncope 02/21/2013  . PAF (paroxysmal atrial fibrillation) ([place]) 02/21/2013  . Chronic anticoagulation 02/21/2013  . Fall 02/08/2013  . Physical deconditioning 02/08/2013  . Low back pain radiating to both legs 02/08/2013  . Compression fracture of L1 lumbar vertebra (HCC) 02/08/2013  . Right tibial fracture 02/05/2013  . Obesity, unspecified 02/05/2013  . Osteomyelitis of right wrist ([place]) 11/02/2012  . Anemia 10/06/2012  . HTN (hypertension) 10/03/2012  . Psoriasis 10/03/2012  . Dyslipidemia 10/03/2012  . Vitamin D deficiency 10/03/2012  . GERD (gastroesophageal reflux disease) 10/03/2012  . Hyperglycemia 10/03/2012  . Anxiety 10/03/2012  . Depression 10/03/2012  . CAD (coronary artery disease) 10/03/2012  . DJD (degenerative joint disease) 10/03/2012  . Chronic gingivitis 10/03/2012  . Low testosterone, possible hypogonadism 10/01/2012  . Normocytic anemia 09/30/2012  . Right femoral fracture ([place]) 09/29/2012  . Right forearm fracture 09/29/2012  . Atrial fibrillation ([place]) 09/29/2012  . Hypothyroidism 09/29/2012  . History of recurrent UTIs 09/29/2012   -continue mx of medical co-morbidities with PCP   PET/CT in 4 weeks RTC with Dr Irene Limbo in 5 weeks with labs    All of the patients questions were answered with apparent satisfaction. The patient knows to call the clinic with any problems, questions or concerns.  . The total time spent in the appointment was 25 minutes and more than 50% was on counseling and direct patient cares.      Sullivan Lone MD [place] AAHIVMS [facility] Hematology/Oncology Physician [facility]  (Office):       [phone] (Work  cell):  [phone] (Fax):  [phone]  10/12/2018 11:23 AM  I, Baldwin [place], am acting as a scribe for Dr. Sullivan Lone.   .I have reviewed the above documentation for accuracy and completeness, and I agree with the above. Brunetta Genera MD

## 2018-10-11 ENCOUNTER — Ambulatory Visit: Payer: Medicare Other

## 2018-10-12 ENCOUNTER — Encounter: Payer: Self-pay | Admitting: Hematology

## 2018-10-12 ENCOUNTER — Inpatient Hospital Stay: Payer: Medicare Other

## 2018-10-12 ENCOUNTER — Other Ambulatory Visit: Payer: Self-pay | Admitting: Cardiovascular Disease

## 2018-10-12 ENCOUNTER — Telehealth: Payer: Self-pay

## 2018-10-12 ENCOUNTER — Inpatient Hospital Stay: Payer: Medicare Other | Attending: Hematology

## 2018-10-12 ENCOUNTER — Inpatient Hospital Stay (HOSPITAL_BASED_OUTPATIENT_CLINIC_OR_DEPARTMENT_OTHER): Payer: Medicare Other | Admitting: Hematology

## 2018-10-12 VITALS — BP 112/69 | HR 78 | Temp 97.5°F | Resp 18 | Ht 75.0 in | Wt 306.6 lb

## 2018-10-12 VITALS — BP 133/58 | HR 86 | Temp 97.8°F | Resp 17

## 2018-10-12 DIAGNOSIS — Z5111 Encounter for antineoplastic chemotherapy: Secondary | ICD-10-CM

## 2018-10-12 DIAGNOSIS — M810 Age-related osteoporosis without current pathological fracture: Secondary | ICD-10-CM

## 2018-10-12 DIAGNOSIS — C8330 Diffuse large B-cell lymphoma, unspecified site: Secondary | ICD-10-CM

## 2018-10-12 DIAGNOSIS — C8339 Diffuse large B-cell lymphoma, extranodal and solid organ sites: Secondary | ICD-10-CM | POA: Diagnosis present

## 2018-10-12 DIAGNOSIS — Z5112 Encounter for antineoplastic immunotherapy: Secondary | ICD-10-CM | POA: Insufficient documentation

## 2018-10-12 DIAGNOSIS — Z5189 Encounter for other specified aftercare: Secondary | ICD-10-CM | POA: Insufficient documentation

## 2018-10-12 DIAGNOSIS — Z7189 Other specified counseling: Secondary | ICD-10-CM

## 2018-10-12 LAB — CBC WITH DIFFERENTIAL/PLATELET
Abs Immature Granulocytes: 0.04 10*3/uL (ref 0.00–0.07)
Basophils Absolute: 0.1 10*3/uL (ref 0.0–0.1)
Basophils Relative: 1 %
Eosinophils Absolute: 0.1 10*3/uL (ref 0.0–0.5)
Eosinophils Relative: 2 %
HCT: 34.3 % — ABNORMAL LOW (ref 39.0–52.0)
Hemoglobin: 11 g/dL — ABNORMAL LOW (ref 13.0–17.0)
Immature Granulocytes: 1 %
LYMPHS ABS: 1 10*3/uL (ref 0.7–4.0)
LYMPHS PCT: 19 %
MCH: 32.3 pg (ref 26.0–34.0)
MCHC: 32.1 g/dL (ref 30.0–36.0)
MCV: 100.6 fL — AB (ref 80.0–100.0)
MONOS PCT: 13 %
Monocytes Absolute: 0.7 10*3/uL (ref 0.1–1.0)
Neutro Abs: 3.5 10*3/uL (ref 1.7–7.7)
Neutrophils Relative %: 64 %
Platelets: 165 10*3/uL (ref 150–400)
RBC: 3.41 MIL/uL — ABNORMAL LOW (ref 4.22–5.81)
RDW: 18.6 % — ABNORMAL HIGH (ref 11.5–15.5)
WBC: 5.4 10*3/uL (ref 4.0–10.5)
nRBC: 0 % (ref 0.0–0.2)

## 2018-10-12 LAB — CMP (CANCER CENTER ONLY)
ALT: 26 U/L (ref 0–44)
AST: 20 U/L (ref 15–41)
Albumin: 3.2 g/dL — ABNORMAL LOW (ref 3.5–5.0)
Alkaline Phosphatase: 52 U/L (ref 38–126)
Anion gap: 8 (ref 5–15)
BUN: 24 mg/dL — ABNORMAL HIGH (ref 8–23)
CHLORIDE: 105 mmol/L (ref 98–111)
CO2: 26 mmol/L (ref 22–32)
Calcium: 9.2 mg/dL (ref 8.9–10.3)
Creatinine: 1.1 mg/dL (ref 0.61–1.24)
GFR, Est AFR Am: >60 mL/min (ref >60)
GFR, Estimated: >60 mL/min (ref >60)
Glucose, Bld: 117 mg/dL — ABNORMAL HIGH (ref 70–99)
Potassium: 4.2 mmol/L (ref 3.5–5.1)
Sodium: 139 mmol/L (ref 135–145)
Total Bilirubin: 0.7 mg/dL (ref 0.3–1.2)
Total Protein: 6.2 g/dL — ABNORMAL LOW (ref 6.5–8.1)

## 2018-10-12 LAB — MAGNESIUM: Magnesium: 1.6 mg/dL — ABNORMAL LOW (ref 1.7–2.4)

## 2018-10-12 LAB — LACTATE DEHYDROGENASE: LDH: 197 U/L — ABNORMAL HIGH (ref 98–192)

## 2018-10-12 MED ORDER — DIPHENHYDRAMINE HCL 25 MG PO CAPS
ORAL_CAPSULE | ORAL | Status: AC
  Filled 2018-10-12: qty 2

## 2018-10-12 MED ORDER — SODIUM CHLORIDE 0.9 % IV SOLN
375.0000 mg/m2 | Freq: Once | INTRAVENOUS | Status: AC
  Administered 2018-10-12: 1000 mg via INTRAVENOUS
  Filled 2018-10-12: qty 100

## 2018-10-12 MED ORDER — PALONOSETRON HCL INJECTION 0.25 MG/5ML
INTRAVENOUS | Status: AC
  Filled 2018-10-12: qty 5

## 2018-10-12 MED ORDER — HEPARIN SOD (PORK) LOCK FLUSH 100 UNIT/ML IV SOLN
500.0000 [IU] | Freq: Once | INTRAVENOUS | Status: AC | PRN
  Administered 2018-10-12: 500 [IU]
  Filled 2018-10-12: qty 5

## 2018-10-12 MED ORDER — SODIUM CHLORIDE 0.9 % IV SOLN
Freq: Once | INTRAVENOUS | Status: AC
  Administered 2018-10-12: 13:00:00 via INTRAVENOUS
  Filled 2018-10-12: qty 250

## 2018-10-12 MED ORDER — ACETAMINOPHEN 325 MG PO TABS
650.0000 mg | ORAL_TABLET | Freq: Once | ORAL | Status: AC
  Administered 2018-10-12: 650 mg via ORAL

## 2018-10-12 MED ORDER — VINCRISTINE SULFATE CHEMO INJECTION 1 MG/ML
2.0000 mg | Freq: Once | INTRAVENOUS | Status: AC
  Administered 2018-10-12: 2 mg via INTRAVENOUS
  Filled 2018-10-12: qty 2

## 2018-10-12 MED ORDER — DEXAMETHASONE SODIUM PHOSPHATE 10 MG/ML IJ SOLN
10.0000 mg | Freq: Once | INTRAMUSCULAR | Status: AC
  Administered 2018-10-12: 10 mg via INTRAVENOUS

## 2018-10-12 MED ORDER — ACETAMINOPHEN 325 MG PO TABS
ORAL_TABLET | ORAL | Status: AC
  Filled 2018-10-12: qty 2

## 2018-10-12 MED ORDER — SODIUM CHLORIDE 0.9 % IV SOLN
740.0000 mg/m2 | Freq: Once | INTRAVENOUS | Status: AC
  Administered 2018-10-12: 2000 mg via INTRAVENOUS
  Filled 2018-10-12: qty 100

## 2018-10-12 MED ORDER — SODIUM CHLORIDE 0.9% FLUSH
10.0000 mL | INTRAVENOUS | Status: DC | PRN
  Administered 2018-10-12: 10 mL
  Filled 2018-10-12: qty 10

## 2018-10-12 MED ORDER — DOXORUBICIN HCL CHEMO IV INJECTION 2 MG/ML
50.0000 mg/m2 | Freq: Once | INTRAVENOUS | Status: AC
  Administered 2018-10-12: 134 mg via INTRAVENOUS
  Filled 2018-10-12: qty 67

## 2018-10-12 MED ORDER — DEXAMETHASONE SODIUM PHOSPHATE 10 MG/ML IJ SOLN
INTRAMUSCULAR | Status: AC
  Filled 2018-10-12: qty 1

## 2018-10-12 MED ORDER — PALONOSETRON HCL INJECTION 0.25 MG/5ML
0.2500 mg | Freq: Once | INTRAVENOUS | Status: AC
  Administered 2018-10-12: 0.25 mg via INTRAVENOUS

## 2018-10-12 MED ORDER — DIPHENHYDRAMINE HCL 25 MG PO CAPS
50.0000 mg | ORAL_CAPSULE | Freq: Once | ORAL | Status: AC
  Administered 2018-10-12: 50 mg via ORAL

## 2018-10-12 NOTE — Patient Instructions (Signed)
[facility] Discharge Instructions for Patients Receiving Chemotherapy  Today you received the following chemotherapy agents Rituxan, Cytoxan, Velcade, and Adriamycin.  To help prevent nausea and vomiting after your treatment, we encourage you to take your nausea medication as directed.  If you develop nausea and vomiting that is not controlled by your nausea medication, call the clinic.   BELOW ARE SYMPTOMS THAT SHOULD BE REPORTED IMMEDIATELY:  *FEVER GREATER THAN 100.5 F  *CHILLS WITH OR WITHOUT FEVER  NAUSEA AND VOMITING THAT IS NOT CONTROLLED WITH YOUR NAUSEA MEDICATION  *UNUSUAL SHORTNESS OF BREATH  *UNUSUAL BRUISING OR BLEEDING  TENDERNESS IN MOUTH AND THROAT WITH OR WITHOUT PRESENCE OF ULCERS  *URINARY PROBLEMS  *BOWEL PROBLEMS  UNUSUAL RASH Items with * indicate a potential emergency and should be followed up as soon as possible.  Feel free to call the clinic should you have any questions or concerns. The clinic phone number is [phone].  Please show the [place] at check-in to the Emergency Department and triage nurse.

## 2018-10-12 NOTE — Telephone Encounter (Signed)
Printed avs and calender of upcoming appointment. Per 1/2 los 

## 2018-10-12 NOTE — Patient Instructions (Signed)
Thank you for choosing [facility] to provide your oncology and hematology care.  To afford each patient quality time with our providers, please arrive 30 minutes before your scheduled appointment time.  If you arrive late for your appointment, you may be asked to reschedule.  We strive to give you quality time with our providers, and arriving late affects you and other patients whose appointments are after yours.    If you are a no show for multiple scheduled visits, you may be dismissed from the clinic at the providers discretion.     Again, thank you for choosing [facility], our hope is that these requests will decrease the amount of time that you wait before being seen by our physicians.  ______________________________________________________________________   Should you have questions after your visit to the  Cancer Center, please contact our office at [phone] between the hours of 8:30 and 4:30 p.m.    Voicemails left after 4:30p.m will not be returned until the following business day.     For prescription refill requests, please have your pharmacy contact us directly.  Please also try to allow 48 hours for prescription requests.     Please contact the scheduling department for questions regarding scheduling.  For scheduling of procedures such as PET scans, CT scans, MRI, Ultrasound, etc please contact central scheduling at [phone].     Resources For Cancer Patients and Caregivers:    Oncolink.org:  A wonderful resource for patients and healthcare providers for information regarding your disease, ways to tract your treatment, what to expect, etc.      American Cancer Society:  800-227-2345  Can help patients locate various types of support and financial assistance   Cancer Care: 1-800-813-HOPE (4673) Provides financial assistance, online support groups, medication/co-pay assistance.     [place] DSS:  336-641-3447 Where to apply  for food stamps, Medicaid, and utility assistance   Medicare Rights Center: 800-333-4114 Helps people with Medicare understand their rights and benefits, navigate the Medicare system, and secure the quality healthcare they deserve   SCAT: 336-333-6589 [place] [employer]'s shared-ride transportation service for eligible riders who have a disability that prevents them from riding the fixed route bus.     For additional information on assistance programs please contact our social worker:   [name]:  [phone]  

## 2018-10-13 ENCOUNTER — Other Ambulatory Visit: Payer: Self-pay | Admitting: *Deleted

## 2018-10-13 ENCOUNTER — Telehealth: Payer: Self-pay | Admitting: *Deleted

## 2018-10-13 ENCOUNTER — Encounter: Payer: Self-pay | Admitting: *Deleted

## 2018-10-13 LAB — LIPID PANEL W/O CHOL/HDL RATIO
Cholesterol, Total: 111 mg/dL (ref 100–199)
HDL: 56 mg/dL (ref >39)
LDL Calculated: 42 mg/dL (ref 0–99)
Triglycerides: 64 mg/dL (ref 0–149)
VLDL Cholesterol Cal: 13 mg/dL (ref 5–40)

## 2018-10-13 MED ORDER — MAGNESIUM 500 MG PO CAPS: 1.0000 | ORAL_CAPSULE | Freq: Every day | ORAL | 1 refills | Status: DC

## 2018-10-13 NOTE — Telephone Encounter (Signed)
Patient asked about Magnesium that was to be sent to [facility]. Per Dr. Grier Mitts order, sent Magnesioum, 500mg  once a day #30, 1 refill to [facility]

## 2018-10-14 ENCOUNTER — Inpatient Hospital Stay: Payer: Medicare Other

## 2018-10-14 VITALS — BP 142/84 | HR 88 | Temp 97.8°F | Resp 20

## 2018-10-14 DIAGNOSIS — C8330 Diffuse large B-cell lymphoma, unspecified site: Secondary | ICD-10-CM

## 2018-10-14 DIAGNOSIS — Z5112 Encounter for antineoplastic immunotherapy: Secondary | ICD-10-CM | POA: Diagnosis not present

## 2018-10-14 DIAGNOSIS — Z7189 Other specified counseling: Secondary | ICD-10-CM

## 2018-10-14 MED ORDER — PEGFILGRASTIM-CBQV 6 MG/0.6ML ~~LOC~~ SOSY
6.0000 mg | PREFILLED_SYRINGE | Freq: Once | SUBCUTANEOUS | Status: AC
  Administered 2018-10-14: 6 mg via SUBCUTANEOUS

## 2018-10-14 MED ORDER — PEGFILGRASTIM-CBQV 6 MG/0.6ML ~~LOC~~ SOSY
PREFILLED_SYRINGE | SUBCUTANEOUS | Status: AC
  Filled 2018-10-14: qty 0.6

## 2018-10-14 NOTE — Patient Instructions (Signed)
Pegfilgrastim injection  What is this medicine?  PEGFILGRASTIM (PEG fil gra stim) is a long-acting granulocyte colony-stimulating factor that stimulates the growth of neutrophils, a type of white blood cell important in the body's fight against infection. It is used to reduce the incidence of fever and infection in patients with certain types of cancer who are receiving chemotherapy that affects the bone marrow, and to increase survival after being exposed to high doses of radiation.  This medicine may be used for other purposes; ask your health care provider or pharmacist if you have questions.  COMMON BRAND NAME(S): Fulphila, Neulasta, UDENYCA  What should I tell my health care provider before I take this medicine?  They need to know if you have any of these conditions:  -kidney disease  -latex allergy  -ongoing radiation therapy  -sickle cell disease  -skin reactions to acrylic adhesives (On-Body Injector only)  -an unusual or allergic reaction to pegfilgrastim, filgrastim, other medicines, foods, dyes, or preservatives  -pregnant or trying to get pregnant  -breast-feeding  How should I use this medicine?  This medicine is for injection under the skin. If you get this medicine at home, you will be taught how to prepare and give the pre-filled syringe or how to use the On-body Injector. Refer to the patient Instructions for Use for detailed instructions. Use exactly as directed. Tell your healthcare provider immediately if you suspect that the On-body Injector may not have performed as intended or if you suspect the use of the On-body Injector resulted in a missed or partial dose.  It is important that you put your used needles and syringes in a special sharps container. Do not put them in a trash can. If you do not have a sharps container, call your pharmacist or healthcare provider to get one.  Talk to your pediatrician regarding the use of this medicine in children. While this drug may be prescribed for  selected conditions, precautions do apply.  Overdosage: If you think you have taken too much of this medicine contact a poison control center or emergency room at once.  NOTE: This medicine is only for you. Do not share this medicine with others.  What if I miss a dose?  It is important not to miss your dose. Call your doctor or health care professional if you miss your dose. If you miss a dose due to an On-body Injector failure or leakage, a new dose should be administered as soon as possible using a single prefilled syringe for manual use.  What may interact with this medicine?  Interactions have not been studied.  Give your health care provider a list of all the medicines, herbs, non-prescription drugs, or dietary supplements you use. Also tell them if you smoke, drink alcohol, or use illegal drugs. Some items may interact with your medicine.  This list may not describe all possible interactions. Give your health care provider a list of all the medicines, herbs, non-prescription drugs, or dietary supplements you use. Also tell them if you smoke, drink alcohol, or use illegal drugs. Some items may interact with your medicine.  What should I watch for while using this medicine?  You may need blood work done while you are taking this medicine.  If you are going to need a MRI, CT scan, or other procedure, tell your doctor that you are using this medicine (On-Body Injector only).  What side effects may I notice from receiving this medicine?  Side effects that you should report to   your doctor or health care professional as soon as possible:  -allergic reactions like skin rash, itching or hives, swelling of the face, lips, or tongue  -back pain  -dizziness  -fever  -pain, redness, or irritation at site where injected  -pinpoint red spots on the skin  -red or dark-brown urine  -shortness of breath or breathing problems  -stomach or side pain, or pain at the shoulder  -swelling  -tiredness  -trouble passing urine or  change in the amount of urine  Side effects that usually do not require medical attention (report to your doctor or health care professional if they continue or are bothersome):  -bone pain  -muscle pain  This list may not describe all possible side effects. Call your doctor for medical advice about side effects. You may report side effects to FDA at 1-800-FDA-1088.  Where should I keep my medicine?  Keep out of the reach of children.  If you are using this medicine at home, you will be instructed on how to store it. Throw away any unused medicine after the expiration date on the label.  NOTE: This sheet is a summary. It may not cover all possible information. If you have questions about this medicine, talk to your doctor, pharmacist, or health care provider.   2019 Elsevier/Gold Standard (2018-01-02 16:57:08)

## 2018-10-18 ENCOUNTER — Encounter: Payer: Self-pay | Admitting: *Deleted

## 2018-10-18 ENCOUNTER — Ambulatory Visit: Payer: Medicare Other

## 2018-10-18 ENCOUNTER — Telehealth: Payer: Self-pay | Admitting: Cardiovascular Disease

## 2018-10-18 NOTE — Telephone Encounter (Signed)
Patient returned call for lab results.  

## 2018-10-18 NOTE — Telephone Encounter (Signed)
Called patient, advised of lab results via voicemail. Normal results.  Advised I would mail copy to patient.  Left call back number if questions.

## 2018-10-23 MED ORDER — METOPROLOL TARTRATE 25 MG PO TABS: 25.0000 mg | ORAL_TABLET | Freq: Two times a day (BID) | ORAL | 1 refills | Status: DC

## 2018-10-24 ENCOUNTER — Ambulatory Visit: Payer: Medicare Other | Attending: Hematology | Admitting: Physical Therapy

## 2018-10-24 DIAGNOSIS — M6281 Muscle weakness (generalized): Secondary | ICD-10-CM | POA: Diagnosis present

## 2018-10-24 DIAGNOSIS — Z9181 History of falling: Secondary | ICD-10-CM | POA: Diagnosis present

## 2018-10-24 DIAGNOSIS — R2689 Other abnormalities of gait and mobility: Secondary | ICD-10-CM | POA: Insufficient documentation

## 2018-10-24 NOTE — Therapy (Signed)
[place], [place], [address] Phone: [phone]   Fax:  [phone]  Physical Therapy Treatment  Patient Details  Name: [name] MRN: [id number] Date of Birth: [birth year] Referring Provider (PT): Dr. Irene Limbo   Encounter Date: 10/24/2018  PT End of Session - 10/24/18 1234    Visit Number  13    Number of Visits  24    Date for PT Re-Evaluation  11/10/18    PT Start Time  1100    PT Stop Time  1145    PT Time Calculation (min)  45 min    Activity Tolerance  Patient tolerated treatment well    Behavior During Therapy  [facility] for tasks assessed/performed       Past Medical History:  Diagnosis Date  . A-fib (HCC)    hx.- sinus rhythm now  . Acid reflux   . AKI (acute kidney injury) (HCC)    mild  . Anxiety   . Arthritis   . Atrial dilatation, bilateral    Severly, enlargement  per ECHO 11/03/16  . BPH (benign prostatic hyperplasia)   . CAD (coronary artery disease)   . Depression   . Dislocation of internal right hip prosthesis, sequela   . Dysrhythmia    hx a fib  . Elevated brain natriuretic peptide (BNP) level   . Environmental allergies   . Fracture of wrist 10/23/13   LEFT  . High cholesterol   . History of Graves' disease   . History of kidney stones    surg. removal 02/2017  . History of syncope   . HLD (hyperlipidemia)   . Hypertension   . Hypothyroidism    thyroid removed  . Left knee injury    cap dislocated  . Low testosterone, possible hypogonadism 10/01/2012  . Lumbago   . Metabolic bone disease   . Multiple falls   . Osteoporosis    Severe  . Plaque psoriasis   . Sleep apnea    cpap machine-settings 17  . Thrombocytopenia ([place])   . Thyroid disease    HX GRAVES DISEASE  . Transfusion history    s/p 12'13 hip surgery  . Unsteady gait   . UTI (urinary tract infection)   . Vertebral compression fracture (HCC)    L1-wears brace    Past Surgical History:   Procedure Laterality Date  . APPENDECTOMY    . CARDIAC CATHETERIZATION  2013  . CATARACT EXTRACTION, BILATERAL    . COLONOSCOPY    . CYSTOSCOPY WITH RETROGRADE PYELOGRAM, URETEROSCOPY AND STENT PLACEMENT Left 11/07/2017   Procedure: CYSTOSCOPY WITH RETROGRADE PYELOGRAM, URETEROSCOPY AND STENT PLACEMENT;  Surgeon: Cleon Gustin, MD;  Location: [facility];  Service: Urology;  Laterality: Left;  . CYSTOSCOPY/URETEROSCOPY/HOLMIUM LASER/STENT PLACEMENT Right 03/11/2017   Procedure: CYSTOSCOPY/URETEROSCOPYSTENT PLACEMENT right ureter retrograde pylegram;  Surgeon: Cleon Gustin, MD;  Location: [facility];  Service: Urology;  Laterality: Right;  . HARDWARE REMOVAL  10/05/2012   Procedure: HARDWARE REMOVAL;  Surgeon: [name], MD;  Location[facility];  Service: Orthopedics;  Laterality: Right;  REMOVING  STRYKER  GAMMA NAIL  . HARDWARE REMOVAL Right 07/03/2013   Procedure: HARDWARE REMOVAL RIGHT TIBIA ;  Surgeon: Rozanna Box, MD;  Location: [facility];  Service: Orthopedics;  Laterality: Right;  . HIP CLOSED REDUCTION Right 10/15/2013   Procedure: CLOSED MANIPULATION HIP;  Surgeon: [name], MD;  [facility];  Service: Orthopedics;  Laterality: Right;  . hip revision  June 2017  . HOLMIUM LASER APPLICATION Right 10/18/4079   Procedure: HOLMIUM LASER APPLICATION;  Surgeon: Cleon Gustin, MD;  [facility];  Service: Urology;  Laterality: Right;  . INCISION AND DRAINAGE HIP Right 11/16/2013   Procedure: IRRIGATION AND DEBRIDEMENT RIGHT HIP;  Surgeon: [name], MD;  [facility];  Service: Orthopedics;  Laterality: Right;  . IR IMAGING GUIDED PORT INSERTION  06/22/2018  . IR URETERAL STENT LEFT NEW ACCESS W/O SEP NEPHROSTOMY CATH  10/24/2017  . JOINT REPLACEMENT     hip-right x2  . KNEE SURGERY Bilateral 2012   Total knee replacements  . NEPHROLITHOTOMY Right 02/08/2017   Procedure: NEPHROLITHOTOMY PERCUTANEOUS WITH SURGEON ACCESS;  Surgeon: Cleon Gustin, MD;   [facility];  Service: Urology;  Laterality: Right;  . NEPHROLITHOTOMY Left 10/24/2017   Procedure: NEPHROLITHOTOMY PERCUTANEOUS;  Surgeon: Cleon Gustin, MD;  [facility];  Service: Urology;  Laterality: Left;  . ORIF TIBIA FRACTURE Right 02/06/2013   Procedure: OPEN REDUCTION INTERNAL FIXATION (ORIF) TIBIA FRACTURE WITH IM ROD FIBULA;  Surgeon: Rozanna Box, MD;  Location: [place];  Service: Orthopedics;  Laterality: Right;  . ORIF TIBIA FRACTURE Right 07/03/2013   Procedure: RIGHT TIBIA NON UNION REPAIR ;  Surgeon: Rozanna Box, MD;  Location: [facility];  Service: Orthopedics;  Laterality: Right;  . ORIF WRIST FRACTURE  10/02/2012   Procedure: OPEN REDUCTION INTERNAL FIXATION (ORIF) WRIST FRACTURE;  Surgeon: Roseanne Kaufman, MD;  [facility];  Service: Orthopedics;  Laterality: Right;  WITH   ANTIBIOTIC  CEMENT  . ORIF WRIST FRACTURE Left 10/28/2013   Procedure: OPEN REDUCTION INTERNAL FIXATION (ORIF) WRIST FRACTURE with allograft;  Surgeon: Roseanne Kaufman, MD;  [facility];  Service: Orthopedics;  Laterality: Left;  DVR Plate  . QUADRICEPS TENDON REPAIR Left 07/15/2017   Procedure: REPAIR QUADRICEP TENDON;  Surgeon: Frederik Pear, MD;  Location: [place];  Service: Orthopedics;  Laterality: Left;  . right femur surgery  05/2012  . THYROIDECTOMY  02/1986  . TOTAL HIP REVISION  10/05/2012   Procedure: TOTAL HIP REVISION;  Surgeon: [name], MD;  [facility];  Service: Orthopedics;  Laterality: Right;  RIGHT TOTAL HIP REVISION  . TOTAL HIP REVISION Right 09/17/2013   Procedure: REVISION RIGHT TOTAL HIP ARTHROPLASTY ;  Surgeon: [name], MD;  [facility];  Service: Orthopedics;  Laterality: Right;  . TOTAL HIP REVISION Right 10/26/2013   Procedure: REVISION RIGHT TOTAL HIP ARTHROPLASTY;  Surgeon: [name], MD;  [facility];  Service: Orthopedics;  Laterality: Right;  . TOTAL HIP REVISION  03/2016  . TOTAL KNEE REVISION Left 04/11/2017   Procedure:  TOTAL KNEE REVISION PATELLA and TIBIA;  Surgeon: Frederik Pear, MD;  Location: [facility];  Service: Orthopedics;  Laterality: Left;  . WRIST FRACTURE SURGERY  05/2012    There were no vitals filed for this visit.  Subjective Assessment - 10/24/18 1103    Subjective  Pt had his last chemo on Jan 2 and it was the worst one.  He had problems with his legs and the "wobbly" but is beginning to get a little better.  He verbalizes that he is recognizing when he might has difficulty and can make changes.  He has develped 5 kidney stones and will have to 3 months to get them treated  He has had them in the past  His PET Scan is scheduled on Feb. 3. Dr. Irene Limbo said he is  in remission and will keep watching it.     Pertinent History  2013 fall resulting in a femur break on the Rt leg resulting in multiple surgeries due to failures including hip replacements another broken tibia leading to the discovery of osteoporosis and a metabolic bone disease.  After forteo injections x 2 years he is now in the osteopenia category.  Has a new scan coming up in a few weeks.  prior to the cancer diagnosis he was able to be confident with a cane.  Then was diagnosed with diffuse non-hogkin large cell B-lymphoma 07/09/18 located in tonsils and Rt groin. Treatment consists of an infusion every 3 weeks hopefully for a total of 5, A-fib controlled, HTN controlled with medication    Patient Stated Goals  get my balance back    Currently in Pain?  --   a littel bit when walking in right lateral thigh                       [facility] Adult PT Treatment/Exercise - 10/24/18 0001      Lumbar Exercises: Supine   Other Supine Lumbar Exercises  leg lengthening 5 reps with each leg, full leg presses to mat 5 reps with each leg       Knee/Hip Exercises: Standing   Other Standing Knee Exercises  standing for 90 seconds with 10 reps of glute sets       Knee/Hip Exercises: Seated   Other Seated Knee/Hip Exercises  hip hinge with cues  to press down through feet and reach tall through crown.  Cued pt to put hands on hamstrings to feel muscles work.     Hamstring Curl  Strengthening;Right;Left;10 reps    Hamstring Limitations  red theraband      Knee/Hip Exercises: Supine   Hip Adduction Isometric  Strengthening;Right;Left;10 reps    Hip Adduction Isometric Limitations  adduction on bolster in legs     Bridges  Strengthening;Right;Left;10 reps      Shoulder Exercises: Supine   Protraction  Strengthening;Right;Left;10 reps    Protraction Weight (lbs)  4    Flexion  Strengthening;Right;Left;10 reps    Flexion Limitations  only raised a few inches. some discomfort in left shoulder     Other Supine Exercises  small circles with hand pointed to ceiling with 5# in hand     Other Supine Exercises  elbow flexion with 4 pounds                   PT Long Term Goals - 09/28/18 1150      PT LONG TERM GOAL #1   Title  Pt will improve sit to stand x 30sec from 21" surface to 8 times     Baseline  09/28/2018  7 times in 30 sec.     Time  6    Period  Weeks    Status  On-going      PT LONG TERM GOAL #2   Title  Pt will improve [place] balance scale to at least 10/40    Time  6    Status  On-going      PT LONG TERM GOAL #3   Title  Pt will be ind with LRAD for home and community use    Baseline  09/28/2018 pt continues to use a RW , he has had episode of fall without hands on walker     Status  Partially Met      PT  LONG TERM GOAL #4   Title  Pt will perform TUG with LRAD at 28sec or less     Baseline  on 09/28/2018  18.5 sec     Status  Achieved      PT LONG TERM GOAL #5   Title  Pt will report no instances of Rt LE giving out x 5 days at home     Baseline  still has difficulty with leg giving out     Time  6    Period  Weeks    Status  On-going      PT LONG TERM GOAL #6   Title  Pt be able to perform one SLR on left leg to 8 inches with no terminal extension lag     Time  6    Period  Weeks     Status  New            Plan - 10/24/18 1235    Clinical Impression Statement  Pt appears to recovering from last chemo, but still is weak.  Decreased # of reps for sit to stand in 30 sec today.  focused on strengthening on the mat today.  Need to continue functional tests next session with plan for progressive strengthening at home with about 4 exercises each week with  check off sheets to follow     PT Frequency  2x / week    PT Duration  6 weeks    PT Treatment/Interventions  ADLs/Self Care Home Management;Therapeutic exercise;Patient/family education;Gait training;Stair training;Balance training;Neuromuscular re-education    PT Next Visit Plan  continue to work on strength to left quads, right hip and core.  Activities for hip strategies for balance with cues to keep knees from locking out  update HEP     Consulted and Agree with Plan of Care  Patient       Patient will benefit from skilled therapeutic intervention in order to improve the following deficits and impairments:  Abnormal gait, Decreased activity tolerance, Decreased endurance, Decreased strength, Decreased balance, Difficulty walking  Visit Diagnosis: Other abnormalities of gait and mobility  History of falling  Muscle weakness (generalized)     Problem List Patient Active Problem List   Diagnosis Date Noted  . Well adult exam 10/02/2018  . Counseling regarding advance care planning and goals of care 07/09/2018  . Diffuse large B-cell lymphoma ([place]) 06/23/2018  . Port-A-Cath in place 06/23/2018  . Hypogonadism in male 11/22/2017  . Nephrolithiasis 10/24/2017  . Recurrent dislocation of left patella 07/15/2017  . Recurrent subluxation of patella, left 07/08/2017  . S/P revision of total knee 04/11/2017  . Failed total knee, left, initial encounter ([place]) 04/09/2017  . Renal calculus, right 02/08/2017  . Protein-calorie malnutrition, severe ([place]) 12/08/2016  . Chronic obstructive pulmonary disease ([place])  12/08/2016  . Staghorn renal calculus 11/05/2016  . Wound drainage right hip 11/15/2013  . Expected blood loss anemia 09/18/2013  . Hyponatremia 09/18/2013  . S/P right TH revision 09/17/2013  . Nonunion, fracture, Right tibia  07/03/2013  . UTI (urinary tract infection) 07/03/2013  . Pathological fracture due to osteoporosis with delayed healing 04/24/2013  . Osteoporosis with fracture 04/24/2013  . Rash and nonspecific skin eruption 03/08/2013  . Syncope 02/21/2013  . PAF (paroxysmal atrial fibrillation) ([place]) 02/21/2013  . Chronic anticoagulation 02/21/2013  . Fall 02/08/2013  . Physical deconditioning 02/08/2013  . Low back pain radiating to both legs 02/08/2013  . Compression fracture of L1 lumbar vertebra (HCC) 02/08/2013  .  Right tibial fracture 02/05/2013  . Obesity, unspecified 02/05/2013  . Osteomyelitis of right wrist ([place]) 11/02/2012  . Anemia 10/06/2012  . HTN (hypertension) 10/03/2012  . Psoriasis 10/03/2012  . Dyslipidemia 10/03/2012  . Vitamin D deficiency 10/03/2012  . GERD (gastroesophageal reflux disease) 10/03/2012  . Hyperglycemia 10/03/2012  . Anxiety 10/03/2012  . Depression 10/03/2012  . CAD (coronary artery disease) 10/03/2012  . DJD (degenerative joint disease) 10/03/2012  . Chronic gingivitis 10/03/2012  . Low testosterone, possible hypogonadism 10/01/2012  . Normocytic anemia 09/30/2012  . Right femoral fracture ([place]) 09/29/2012  . Right forearm fracture 09/29/2012  . Atrial fibrillation ([place]) 09/29/2012  . Hypothyroidism 09/29/2012  . History of recurrent UTIs 09/29/2012   Donato Heinz. Owens Shark PT  [place] Levo 10/24/2018, 12:40 PM  [place], [place], [address] Phone: [phone]   Fax:  [phone]  Name: [name] MRN: [id number] Date of Birth: Jun 22, 1946

## 2018-10-26 ENCOUNTER — Encounter: Payer: Self-pay | Admitting: Physical Therapy

## 2018-10-26 ENCOUNTER — Ambulatory Visit: Payer: Medicare Other | Admitting: Physical Therapy

## 2018-10-26 DIAGNOSIS — R2689 Other abnormalities of gait and mobility: Secondary | ICD-10-CM | POA: Diagnosis not present

## 2018-10-26 DIAGNOSIS — M6281 Muscle weakness (generalized): Secondary | ICD-10-CM

## 2018-10-26 DIAGNOSIS — Z9181 History of falling: Secondary | ICD-10-CM

## 2018-10-26 NOTE — Patient Instructions (Signed)
Home exercise for staggered stance weight shift, hand up the wall with forward step, heel raise, wall push up  [place] link to email

## 2018-10-26 NOTE — Therapy (Signed)
[place], [place], [address] Phone: [phone]   Fax:  [phone]  Physical Therapy Treatment  Patient Details  Name: [name] MRN: [id number] Date of Birth: January 25, 1946 Referring Provider (PT): Dr. Irene Limbo   Encounter Date: 10/26/2018  PT End of Session - 10/26/18 1431    Visit Number  14    Number of Visits  24    Date for PT Re-Evaluation  11/10/18    PT Start Time  9892    PT Stop Time  1430    PT Time Calculation (min)  45 min    Activity Tolerance  Patient tolerated treatment well    Behavior During Therapy  [facility] for tasks assessed/performed       Past Medical History:  Diagnosis Date  . A-fib (HCC)    hx.- sinus rhythm now  . Acid reflux   . AKI (acute kidney injury) (HCC)    mild  . Anxiety   . Arthritis   . Atrial dilatation, bilateral    Severly, enlargement  per ECHO 11/03/16  . BPH (benign prostatic hyperplasia)   . CAD (coronary artery disease)   . Depression   . Dislocation of internal right hip prosthesis, sequela   . Dysrhythmia    hx a fib  . Elevated brain natriuretic peptide (BNP) level   . Environmental allergies   . Fracture of wrist 10/23/13   LEFT  . High cholesterol   . History of Graves' disease   . History of kidney stones    surg. removal 02/2017  . History of syncope   . HLD (hyperlipidemia)   . Hypertension   . Hypothyroidism    thyroid removed  . Left knee injury    cap dislocated  . Low testosterone, possible hypogonadism 10/01/2012  . Lumbago   . Metabolic bone disease   . Multiple falls   . Osteoporosis    Severe  . Plaque psoriasis   . Sleep apnea    cpap machine-settings 17  . Thrombocytopenia ([place])   . Thyroid disease    HX GRAVES DISEASE  . Transfusion history    s/p 12'13 hip surgery  . Unsteady gait   . UTI (urinary tract infection)   . Vertebral compression fracture (HCC)    L1-wears brace    Past Surgical History:   Procedure Laterality Date  . APPENDECTOMY    . CARDIAC CATHETERIZATION  2013  . CATARACT EXTRACTION, BILATERAL    . COLONOSCOPY    . CYSTOSCOPY WITH RETROGRADE PYELOGRAM, URETEROSCOPY AND STENT PLACEMENT Left 11/07/2017   Procedure: CYSTOSCOPY WITH RETROGRADE PYELOGRAM, URETEROSCOPY AND STENT PLACEMENT;  Surgeon: Cleon Gustin, MD;  Location: [facility];  Service: Urology;  Laterality: Left;  . CYSTOSCOPY/URETEROSCOPY/HOLMIUM LASER/STENT PLACEMENT Right 03/11/2017   Procedure: CYSTOSCOPY/URETEROSCOPYSTENT PLACEMENT right ureter retrograde pylegram;  Surgeon: Cleon Gustin, MD;  Location: [facility];  Service: Urology;  Laterality: Right;  . HARDWARE REMOVAL  10/05/2012   Procedure: HARDWARE REMOVAL;  Surgeon: [name], MD;  Location: [facility];  Service: Orthopedics;  Laterality: Right;  REMOVING  STRYKER  GAMMA NAIL  . HARDWARE REMOVAL Right 07/03/2013   Procedure: HARDWARE REMOVAL RIGHT TIBIA ;  Surgeon: Rozanna Box, MD;  Location: [place];  Service: Orthopedics;  Laterality: Right;  . HIP CLOSED REDUCTION Right 10/15/2013   Procedure: CLOSED MANIPULATION HIP;  Surgeon: [name], MD;  Location: [facility];  Service: Orthopedics;  Laterality: Right;  . hip revision  June 2017  . HOLMIUM LASER APPLICATION Right 4/0/7680   Procedure: HOLMIUM LASER APPLICATION;  Surgeon: Cleon Gustin, MD;  Location: [facility];  Service: Urology;  Laterality: Right;  . INCISION AND DRAINAGE HIP Right 11/16/2013   Procedure: IRRIGATION AND DEBRIDEMENT RIGHT HIP;  Surgeon: [name], MD;  Location: [facility];  Service: Orthopedics;  Laterality: Right;  . IR IMAGING GUIDED PORT INSERTION  06/22/2018  . IR URETERAL STENT LEFT NEW ACCESS W/O SEP NEPHROSTOMY CATH  10/24/2017  . JOINT REPLACEMENT     hip-right x2  . KNEE SURGERY Bilateral 2012   Total knee replacements  . NEPHROLITHOTOMY Right 02/08/2017   Procedure: NEPHROLITHOTOMY PERCUTANEOUS WITH SURGEON ACCESS;  Surgeon: Cleon Gustin, MD;   Location: [facility];  Service: Urology;  Laterality: Right;  . NEPHROLITHOTOMY Left 10/24/2017   Procedure: NEPHROLITHOTOMY PERCUTANEOUS;  Surgeon: Cleon Gustin, MD;  Location: [facility];  Service: Urology;  Laterality: Left;  . ORIF TIBIA FRACTURE Right 02/06/2013   Procedure: OPEN REDUCTION INTERNAL FIXATION (ORIF) TIBIA FRACTURE WITH IM ROD FIBULA;  Surgeon: Rozanna Box, MD;  Location: [place];  Service: Orthopedics;  Laterality: Right;  . ORIF TIBIA FRACTURE Right 07/03/2013   Procedure: RIGHT TIBIA NON UNION REPAIR ;  Surgeon: Rozanna Box, MD;  Location: [place];  Service: Orthopedics;  Laterality: Right;  . ORIF WRIST FRACTURE  10/02/2012   Procedure: OPEN REDUCTION INTERNAL FIXATION (ORIF) WRIST FRACTURE;  Surgeon: Roseanne Kaufman, MD;  Location: [facility];  Service: Orthopedics;  Laterality: Right;  WITH   ANTIBIOTIC  CEMENT  . ORIF WRIST FRACTURE Left 10/28/2013   Procedure: OPEN REDUCTION INTERNAL FIXATION (ORIF) WRIST FRACTURE with allograft;  Surgeon: Roseanne Kaufman, MD;  Location: [facility];  Service: Orthopedics;  Laterality: Left;  DVR Plate  . QUADRICEPS TENDON REPAIR Left 07/15/2017   Procedure: REPAIR QUADRICEP TENDON;  Surgeon: Frederik Pear, MD;  Location: [place];  Service: Orthopedics;  Laterality: Left;  . right femur surgery  05/2012  . THYROIDECTOMY  02/1986  . TOTAL HIP REVISION  10/05/2012   Procedure: TOTAL HIP REVISION;  Surgeon: [name], MD;  Location: [facility];  Service: Orthopedics;  Laterality: Right;  RIGHT TOTAL HIP REVISION  . TOTAL HIP REVISION Right 09/17/2013   Procedure: REVISION RIGHT TOTAL HIP ARTHROPLASTY ;  Surgeon: [name], MD;  Location: [facility];  Service: Orthopedics;  Laterality: Right;  . TOTAL HIP REVISION Right 10/26/2013   Procedure: REVISION RIGHT TOTAL HIP ARTHROPLASTY;  Surgeon: [name], MD;  Location: [facility];  Service: Orthopedics;  Laterality: Right;  . TOTAL HIP REVISION  03/2016  . TOTAL KNEE REVISION Left 04/11/2017   Procedure:  TOTAL KNEE REVISION PATELLA and TIBIA;  Surgeon: Frederik Pear, MD;  Location: [place];  Service: Orthopedics;  Laterality: Left;  . WRIST FRACTURE SURGERY  05/2012    There were no vitals filed for this visit.  Subjective Assessment - 10/26/18 1356    Subjective  Pt has been busy today.  This is his 4 appointment today.  He is a littley dyspneic but feels well.     Pertinent History  2013 fall resulting in a femur break on the Rt leg resulting in multiple surgeries due to failures including hip replacements another broken tibia leading to the discovery of osteoporosis and a metabolic bone disease.  After forteo injections x 2 years he is now in the osteopenia category.  Has a new scan coming up in a few  weeks.  prior to the cancer diagnosis he was able to be confident with a cane.  Then was diagnosed with diffuse non-hogkin large cell B-lymphoma 07/09/18 located in tonsils and Rt groin. Treatment consists of an infusion every 3 weeks hopefully for a total of 5, A-fib controlled, HTN controlled with medication    Patient Stated Goals  get my balance back    Currently in Pain?  Yes   "the usual "                       [facility] Adult PT Treatment/Exercise - 10/26/18 0001      Knee/Hip Exercises: Standing   Other Standing Knee Exercises  staggered stand weight shift and stetp through       Shoulder Exercises: Standing   Other Standing Exercises  wall push ups       Ankle Exercises: Standing   Other Standing Ankle Exercises  heel raises              PT Education - 10/26/18 1431    Education Details  standing home exercise with [place] instruction and log sheet     Person(s) Educated  Patient    Methods  Explanation;Demonstration;Handout    Comprehension  Verbalized understanding;Returned demonstration          PT Long Term Goals - 09/28/18 1150      PT LONG TERM GOAL #1   Title  Pt will improve sit to stand x 30sec from 21" surface to 8 times     Baseline   09/28/2018  7 times in 30 sec.     Time  6    Period  Weeks    Status  On-going      PT LONG TERM GOAL #2   Title  Pt will improve [place] balance scale to at least 10/40    Time  6    Status  On-going      PT LONG TERM GOAL #3   Title  Pt will be ind with LRAD for home and community use    Baseline  09/28/2018 pt continues to use a RW , he has had episode of fall without hands on walker     Status  Partially Met      PT LONG TERM GOAL #4   Title  Pt will perform TUG with LRAD at 28sec or less     Baseline  on 09/28/2018  18.5 sec     Status  Achieved      PT LONG TERM GOAL #5   Title  Pt will report no instances of Rt LE giving out x 5 days at home     Baseline  still has difficulty with leg giving out     Time  6    Period  Weeks    Status  On-going      PT LONG TERM GOAL #6   Title  Pt be able to perform one SLR on left leg to 8 inches with no terminal extension lag     Time  6    Period  Weeks    Status  New            Plan - 10/26/18 1432    Clinical Impression Statement  Pt is having some SOB with exertion today.  He feels fatigued from earlier activites.  He understands the 4 exercises he will do a  home program and feels he will be able to complete  the participation log     Rehab Potential  Good    PT Frequency  2x / week    PT Duration  6 weeks    PT Treatment/Interventions  ADLs/Self Care Home Management;Therapeutic exercise;Patient/family education;Gait training;Stair training;Balance training;Neuromuscular re-education    PT Next Visit Plan  Check progress with HEP, and detemine next exercises in progression continue to work on strength to left quads, right hip and core.  Activities for hip strategies for balance with cues to keep knees from locking out  update HEP     PT Home Exercise Plan  [id number]    Consulted and Agree with Plan of Care  Patient       Patient will benefit from skilled therapeutic intervention in order to improve the following  deficits and impairments:  Abnormal gait, Decreased activity tolerance, Decreased endurance, Decreased strength, Decreased balance, Difficulty walking  Visit Diagnosis: Other abnormalities of gait and mobility  History of falling  Muscle weakness (generalized)     Problem List Patient Active Problem List   Diagnosis Date Noted  . Well adult exam 10/02/2018  . Counseling regarding advance care planning and goals of care 07/09/2018  . Diffuse large B-cell lymphoma ([place]) 06/23/2018  . Port-A-Cath in place 06/23/2018  . Hypogonadism in male 11/22/2017  . Nephrolithiasis 10/24/2017  . Recurrent dislocation of left patella 07/15/2017  . Recurrent subluxation of patella, left 07/08/2017  . S/P revision of total knee 04/11/2017  . Failed total knee, left, initial encounter ([place]) 04/09/2017  . Renal calculus, right 02/08/2017  . Protein-calorie malnutrition, severe ([place]) 12/08/2016  . Chronic obstructive pulmonary disease ([place]) 12/08/2016  . Staghorn renal calculus 11/05/2016  . Wound drainage right hip 11/15/2013  . Expected blood loss anemia 09/18/2013  . Hyponatremia 09/18/2013  . S/P right TH revision 09/17/2013  . Nonunion, fracture, Right tibia  07/03/2013  . UTI (urinary tract infection) 07/03/2013  . Pathological fracture due to osteoporosis with delayed healing 04/24/2013  . Osteoporosis with fracture 04/24/2013  . Rash and nonspecific skin eruption 03/08/2013  . Syncope 02/21/2013  . PAF (paroxysmal atrial fibrillation) ([place]) 02/21/2013  . Chronic anticoagulation 02/21/2013  . Fall 02/08/2013  . Physical deconditioning 02/08/2013  . Low back pain radiating to both legs 02/08/2013  . Compression fracture of L1 lumbar vertebra (HCC) 02/08/2013  . Right tibial fracture 02/05/2013  . Obesity, unspecified 02/05/2013  . Osteomyelitis of right wrist ([place]) 11/02/2012  . Anemia 10/06/2012  . HTN (hypertension) 10/03/2012  . Psoriasis 10/03/2012  . Dyslipidemia 10/03/2012   . Vitamin D deficiency 10/03/2012  . GERD (gastroesophageal reflux disease) 10/03/2012  . Hyperglycemia 10/03/2012  . Anxiety 10/03/2012  . Depression 10/03/2012  . CAD (coronary artery disease) 10/03/2012  . DJD (degenerative joint disease) 10/03/2012  . Chronic gingivitis 10/03/2012  . Low testosterone, possible hypogonadism 10/01/2012  . Normocytic anemia 09/30/2012  . Right femoral fracture ([place]) 09/29/2012  . Right forearm fracture 09/29/2012  . Atrial fibrillation ([place]) 09/29/2012  . Hypothyroidism 09/29/2012  . History of recurrent UTIs 09/29/2012   Donato Heinz. Owens Shark PT  Norwood Levo 10/26/2018, 2:34 PM  [place], [place], [address] Phone: [phone]   Fax:  [phone]  Name: [name] MRN: [id number] Date of Birth: 12/24/1945

## 2018-10-31 ENCOUNTER — Encounter: Payer: Self-pay | Admitting: Physical Therapy

## 2018-10-31 ENCOUNTER — Ambulatory Visit: Payer: Medicare Other | Admitting: Physical Therapy

## 2018-10-31 DIAGNOSIS — R2689 Other abnormalities of gait and mobility: Secondary | ICD-10-CM

## 2018-10-31 DIAGNOSIS — M6281 Muscle weakness (generalized): Secondary | ICD-10-CM

## 2018-10-31 DIAGNOSIS — Z9181 History of falling: Secondary | ICD-10-CM

## 2018-10-31 NOTE — Therapy (Signed)
[place], [place], [address] Phone: [phone]   Fax:  [phone]  Physical Therapy Treatment  Patient Details  Name: [name] MRN: [id number] Date of Birth: April 25, 1946 Referring Provider (PT): Dr. Irene Limbo   Encounter Date: 10/31/2018  PT End of Session - 10/31/18 1203    Visit Number  15    Number of Visits  24    Date for PT Re-Evaluation  11/10/18    PT Start Time  1105    PT Stop Time  1145    PT Time Calculation (min)  40 min    Activity Tolerance  Patient tolerated treatment well    Behavior During Therapy  [facility] for tasks assessed/performed       Past Medical History:  Diagnosis Date  . A-fib (HCC)    hx.- sinus rhythm now  . Acid reflux   . AKI (acute kidney injury) (HCC)    mild  . Anxiety   . Arthritis   . Atrial dilatation, bilateral    Severly, enlargement  per ECHO 11/03/16  . BPH (benign prostatic hyperplasia)   . CAD (coronary artery disease)   . Depression   . Dislocation of internal right hip prosthesis, sequela   . Dysrhythmia    hx a fib  . Elevated brain natriuretic peptide (BNP) level   . Environmental allergies   . Fracture of wrist 10/23/13   LEFT  . High cholesterol   . History of Graves' disease   . History of kidney stones    surg. removal 02/2017  . History of syncope   . HLD (hyperlipidemia)   . Hypertension   . Hypothyroidism    thyroid removed  . Left knee injury    cap dislocated  . Low testosterone, possible hypogonadism 10/01/2012  . Lumbago   . Metabolic bone disease   . Multiple falls   . Osteoporosis    Severe  . Plaque psoriasis   . Sleep apnea    cpap machine-settings 17  . Thrombocytopenia ([place])   . Thyroid disease    HX GRAVES DISEASE  . Transfusion history    s/p 12'13 hip surgery  . Unsteady gait   . UTI (urinary tract infection)   . Vertebral compression fracture (HCC)    L1-wears brace    Past Surgical History:   Procedure Laterality Date  . APPENDECTOMY    . CARDIAC CATHETERIZATION  2013  . CATARACT EXTRACTION, BILATERAL    . COLONOSCOPY    . CYSTOSCOPY WITH RETROGRADE PYELOGRAM, URETEROSCOPY AND STENT PLACEMENT Left 11/07/2017   Procedure: CYSTOSCOPY WITH RETROGRADE PYELOGRAM, URETEROSCOPY AND STENT PLACEMENT;  Surgeon: Cleon Gustin, MD;  Location: [facility];  Service: Urology;  Laterality: Left;  . CYSTOSCOPY/URETEROSCOPY/HOLMIUM LASER/STENT PLACEMENT Right 03/11/2017   Procedure: CYSTOSCOPY/URETEROSCOPYSTENT PLACEMENT right ureter retrograde pylegram;  Surgeon: Cleon Gustin, MD;  Location: [facility];  Service: Urology;  Laterality: Right;  . HARDWARE REMOVAL  10/05/2012   Procedure: HARDWARE REMOVAL;  Surgeon: [name], MD;  Location: [facility];  Service: Orthopedics;  Laterality: Right;  REMOVING  STRYKER  GAMMA NAIL  . HARDWARE REMOVAL Right 07/03/2013   Procedure: HARDWARE REMOVAL RIGHT TIBIA ;  Surgeon: Rozanna Box, MD;  Location: [place];  Service: Orthopedics;  Laterality: Right;  . HIP CLOSED REDUCTION Right 10/15/2013   Procedure: CLOSED MANIPULATION HIP;  Surgeon: [name], MD;  Location: [facility];  Service: Orthopedics;  Laterality: Right;  . hip revision  June 2017  . HOLMIUM LASER APPLICATION Right 12/13/1935   Procedure: HOLMIUM LASER APPLICATION;  Surgeon: Cleon Gustin, MD;  Location: [facility];  Service: Urology;  Laterality: Right;  . INCISION AND DRAINAGE HIP Right 11/16/2013   Procedure: IRRIGATION AND DEBRIDEMENT RIGHT HIP;  Surgeon: [name], MD;  Location: [facility];  Service: Orthopedics;  Laterality: Right;  . IR IMAGING GUIDED PORT INSERTION  06/22/2018  . IR URETERAL STENT LEFT NEW ACCESS W/O SEP NEPHROSTOMY CATH  10/24/2017  . JOINT REPLACEMENT     hip-right x2  . KNEE SURGERY Bilateral 2012   Total knee replacements  . NEPHROLITHOTOMY Right 02/08/2017   Procedure: NEPHROLITHOTOMY PERCUTANEOUS WITH SURGEON ACCESS;  Surgeon: Cleon Gustin, MD;   [facility];  Service: Urology;  Laterality: Right;  . NEPHROLITHOTOMY Left 10/24/2017   Procedure: NEPHROLITHOTOMY PERCUTANEOUS;  Surgeon: Cleon Gustin, MD;  [facility];  Service: Urology;  Laterality: Left;  . ORIF TIBIA FRACTURE Right 02/06/2013   Procedure: OPEN REDUCTION INTERNAL FIXATION (ORIF) TIBIA FRACTURE WITH IM ROD FIBULA;  Surgeon: Rozanna Box, MD;  Location: [place];  Service: Orthopedics;  Laterality: Right;  . ORIF TIBIA FRACTURE Right 07/03/2013   Procedure: RIGHT TIBIA NON UNION REPAIR ;  Surgeon: Rozanna Box, MD;  Location: [place];  Service: Orthopedics;  Laterality: Right;  . ORIF WRIST FRACTURE  10/02/2012   Procedure: OPEN REDUCTION INTERNAL FIXATION (ORIF) WRIST FRACTURE;  Surgeon: Roseanne Kaufman, MD;  Location[facility];  Service: Orthopedics;  Laterality: Right;  WITH   ANTIBIOTIC  CEMENT  . ORIF WRIST FRACTURE Left 10/28/2013   Procedure: OPEN REDUCTION INTERNAL FIXATION (ORIF) WRIST FRACTURE with allograft;  Surgeon: Roseanne Kaufman, MD;  [facility];  Service: Orthopedics;  Laterality: Left;  DVR Plate  . QUADRICEPS TENDON REPAIR Left 07/15/2017   Procedure: REPAIR QUADRICEP TENDON;  Surgeon: Frederik Pear, MD;  Location: [place];  Service: Orthopedics;  Laterality: Left;  . right femur surgery  05/2012  . THYROIDECTOMY  02/1986  . TOTAL HIP REVISION  10/05/2012   Procedure: TOTAL HIP REVISION;  Surgeon: [name], MD;  [facility];  Service: Orthopedics;  Laterality: Right;  RIGHT TOTAL HIP REVISION  . TOTAL HIP REVISION Right 09/17/2013   Procedure: REVISION RIGHT TOTAL HIP ARTHROPLASTY ;  Surgeon: [name], MD;  [facility];  Service: Orthopedics;  Laterality: Right;  . TOTAL HIP REVISION Right 10/26/2013   Procedure: REVISION RIGHT TOTAL HIP ARTHROPLASTY;  Surgeon: [name], MD;  Location[facility];  Service: Orthopedics;  Laterality: Right;  . TOTAL HIP REVISION  03/2016  . TOTAL KNEE REVISION Left 04/11/2017   Procedure:  TOTAL KNEE REVISION PATELLA and TIBIA;  Surgeon: Frederik Pear, MD;  Location: [place];  Service: Orthopedics;  Laterality: Left;  . WRIST FRACTURE SURGERY  05/2012    There were no vitals filed for this visit.  Subjective Assessment - 10/31/18 1108    Subjective  Pt fell yesterday and he fell again this morning. Yesterday he was putting his coat on and when he reached back, he fell backwards. This morning he fell again trying to get on the stair lift "I throw myself off balance"  He was able to do the exercises last week.     Pertinent History  2013 fall resulting in a femur break on the Rt leg resulting in multiple surgeries due to failures including hip replacements another broken tibia leading to the discovery of  osteoporosis and a metabolic bone disease.  After forteo injections x 2 years he is now in the osteopenia category.  Has a new scan coming up in a few weeks.  prior to the cancer diagnosis he was able to be confident with a cane.  Then was diagnosed with diffuse non-hogkin large cell B-lymphoma 07/09/18 located in tonsils and Rt groin. Treatment consists of an infusion every 3 weeks hopefully for a total of 5, A-fib controlled, HTN controlled with medication    Patient Stated Goals  get my balance back    Currently in Pain?  Yes    Pain Score  --   did not rate                       [facility] Adult PT Treatment/Exercise - 10/31/18 0001      Exercises   Exercises  Elbow;Lumbar;Knee/Hip      Elbow Exercises   Elbow Flexion  Strengthening    Bar Weights/Barbell (Elbow Flexion)  5 lbs    Elbow Flexion Limitations  1set of 10 bilaterally, one set unilaterally       Lumbar Exercises: Standing   Other Standing Lumbar Exercises  standing with back to counter for wall taps x 10, then reach up and back in diagonal pattern ( simulating motion when putting on coat and pull back forward and across body x 10 reps.     Other Standing Lumbar Exercises  mini squats with 10 sec holds x  10 reps in front of mat and with walker in front as he has a fear of falling in this position.       Shoulder Exercises: Seated   Retraction  Strengthening;Right;Left;10 reps    External Rotation  Strengthening;Right;Left;10 reps      Shoulder Exercises: Isometric Strengthening   Flexion  5X5"    Extension  5X5"    ABduction  5X5"                  PT Long Term Goals - 09/28/18 1150      PT LONG TERM GOAL #1   Title  Pt will improve sit to stand x 30sec from 21" surface to 8 times     Baseline  09/28/2018  7 times in 30 sec.     Time  6    Period  Weeks    Status  On-going      PT LONG TERM GOAL #2   Title  Pt will improve [place] balance scale to at least 10/40    Time  6    Status  On-going      PT LONG TERM GOAL #3   Title  Pt will be ind with LRAD for home and community use    Baseline  09/28/2018 pt continues to use a RW , he has had episode of fall without hands on walker     Status  Partially Met      PT LONG TERM GOAL #4   Title  Pt will perform TUG with LRAD at 28sec or less     Baseline  on 09/28/2018  18.5 sec     Status  Achieved      PT LONG TERM GOAL #5   Title  Pt will report no instances of Rt LE giving out x 5 days at home     Baseline  still has difficulty with leg giving out     Time  6    Period  Weeks    Status  On-going      PT LONG TERM GOAL #6   Title  Pt be able to perform one SLR on left leg to 8 inches with no terminal extension lag     Time  6    Period  Weeks    Status  New            Plan - 10/31/18 1203    Clinical Impression Statement  Pt has had 2 falls in 2 days, but recognizes what he did.  He knows he moves too fast and loses his balance.  He has been developing some left shoulder pain and has some weakness with open chain exercises with this, so performed isometrics and he did well with these. He is also aware of core engagement and trying to keep knees from being locked out as he does these.  Pt is working  hard at keeping a postitive attifude and will be more careful to avoid more falling     PT Next Visit Plan  Assess goals Check progress with HEP, and detemine next exercises in progression (add lateral weight shift) continue to work on strength to left quads, right hip and core.  Activities for hip strategies for balance with cues to keep knees from locking out  update HEP        Patient will benefit from skilled therapeutic intervention in order to improve the following deficits and impairments:  Abnormal gait, Decreased activity tolerance, Decreased endurance, Decreased strength, Decreased balance, Difficulty walking  Visit Diagnosis: Other abnormalities of gait and mobility  History of falling  Muscle weakness (generalized)     Problem List Patient Active Problem List   Diagnosis Date Noted  . Well adult exam 10/02/2018  . Counseling regarding advance care planning and goals of care 07/09/2018  . Diffuse large B-cell lymphoma ([place]) 06/23/2018  . Port-A-Cath in place 06/23/2018  . Hypogonadism in male 11/22/2017  . Nephrolithiasis 10/24/2017  . Recurrent dislocation of left patella 07/15/2017  . Recurrent subluxation of patella, left 07/08/2017  . S/P revision of total knee 04/11/2017  . Failed total knee, left, initial encounter ([place]) 04/09/2017  . Renal calculus, right 02/08/2017  . Protein-calorie malnutrition, severe ([place]) 12/08/2016  . Chronic obstructive pulmonary disease ([place]) 12/08/2016  . Staghorn renal calculus 11/05/2016  . Wound drainage right hip 11/15/2013  . Expected blood loss anemia 09/18/2013  . Hyponatremia 09/18/2013  . S/P right TH revision 09/17/2013  . Nonunion, fracture, Right tibia  07/03/2013  . UTI (urinary tract infection) 07/03/2013  . Pathological fracture due to osteoporosis with delayed healing 04/24/2013  . Osteoporosis with fracture 04/24/2013  . Rash and nonspecific skin eruption 03/08/2013  . Syncope 02/21/2013  . PAF (paroxysmal  atrial fibrillation) ([place]) 02/21/2013  . Chronic anticoagulation 02/21/2013  . Fall 02/08/2013  . Physical deconditioning 02/08/2013  . Low back pain radiating to both legs 02/08/2013  . Compression fracture of L1 lumbar vertebra (HCC) 02/08/2013  . Right tibial fracture 02/05/2013  . Obesity, unspecified 02/05/2013  . Osteomyelitis of right wrist ([place]) 11/02/2012  . Anemia 10/06/2012  . HTN (hypertension) 10/03/2012  . Psoriasis 10/03/2012  . Dyslipidemia 10/03/2012  . Vitamin D deficiency 10/03/2012  . GERD (gastroesophageal reflux disease) 10/03/2012  . Hyperglycemia 10/03/2012  . Anxiety 10/03/2012  . Depression 10/03/2012  . CAD (coronary artery disease) 10/03/2012  . DJD (degenerative joint disease) 10/03/2012  . Chronic gingivitis 10/03/2012  . Low testosterone, possible hypogonadism 10/01/2012  .  Normocytic anemia 09/30/2012  . Right femoral fracture ([place]) 09/29/2012  . Right forearm fracture 09/29/2012  . Atrial fibrillation ([place]) 09/29/2012  . Hypothyroidism 09/29/2012  . History of recurrent UTIs 09/29/2012   Donato Heinz. Owens Shark PT  Norwood Levo 10/31/2018, 12:07 PM  [place], [place], [address] Phone: [phone]   Fax:  [phone]  Name: [name] MRN: [id number] Date of Birth: May 02, 1946

## 2018-11-02 ENCOUNTER — Ambulatory Visit: Payer: Medicare Other | Admitting: Physical Therapy

## 2018-11-02 ENCOUNTER — Encounter: Payer: Self-pay | Admitting: Physical Therapy

## 2018-11-02 DIAGNOSIS — M6281 Muscle weakness (generalized): Secondary | ICD-10-CM

## 2018-11-02 DIAGNOSIS — R2689 Other abnormalities of gait and mobility: Secondary | ICD-10-CM | POA: Diagnosis not present

## 2018-11-02 DIAGNOSIS — Z9181 History of falling: Secondary | ICD-10-CM

## 2018-11-02 NOTE — Therapy (Signed)
[place], [place], [address] Phone: [phone]   Fax:  [phone]  Physical Therapy Treatment  Patient Details  Name: [name] MRN: [id number] Date of Birth: [birth year] Referring Provider (PT): Dr. Irene Limbo   Encounter Date: 11/02/2018  PT End of Session - 11/02/18 1232    Visit Number  16    Number of Visits  24    Date for PT Re-Evaluation  11/10/18    PT Start Time  1105    PT Stop Time  1155    PT Time Calculation (min)  50 min    Activity Tolerance  Patient tolerated treatment well    Behavior During Therapy  [facility] for tasks assessed/performed       Past Medical History:  Diagnosis Date  . A-fib (HCC)    hx.- sinus rhythm now  . Acid reflux   . AKI (acute kidney injury) (HCC)    mild  . Anxiety   . Arthritis   . Atrial dilatation, bilateral    Severly, enlargement  per ECHO 11/03/16  . BPH (benign prostatic hyperplasia)   . CAD (coronary artery disease)   . Depression   . Dislocation of internal right hip prosthesis, sequela   . Dysrhythmia    hx a fib  . Elevated brain natriuretic peptide (BNP) level   . Environmental allergies   . Fracture of wrist 10/23/13   LEFT  . High cholesterol   . History of Graves' disease   . History of kidney stones    surg. removal 02/2017  . History of syncope   . HLD (hyperlipidemia)   . Hypertension   . Hypothyroidism    thyroid removed  . Left knee injury    cap dislocated  . Low testosterone, possible hypogonadism 10/01/2012  . Lumbago   . Metabolic bone disease   . Multiple falls   . Osteoporosis    Severe  . Plaque psoriasis   . Sleep apnea    cpap machine-settings 17  . Thrombocytopenia ([place])   . Thyroid disease    HX GRAVES DISEASE  . Transfusion history    s/p 12'13 hip surgery  . Unsteady gait   . UTI (urinary tract infection)   . Vertebral compression fracture (HCC)    L1-wears brace    Past Surgical History:   Procedure Laterality Date  . APPENDECTOMY    . CARDIAC CATHETERIZATION  2013  . CATARACT EXTRACTION, BILATERAL    . COLONOSCOPY    . CYSTOSCOPY WITH RETROGRADE PYELOGRAM, URETEROSCOPY AND STENT PLACEMENT Left 11/07/2017   Procedure: CYSTOSCOPY WITH RETROGRADE PYELOGRAM, URETEROSCOPY AND STENT PLACEMENT;  Surgeon: Cleon Gustin, MD;  Location: [facility];  Service: Urology;  Laterality: Left;  . CYSTOSCOPY/URETEROSCOPY/HOLMIUM LASER/STENT PLACEMENT Right 03/11/2017   Procedure: CYSTOSCOPY/URETEROSCOPYSTENT PLACEMENT right ureter retrograde pylegram;  Surgeon: Cleon Gustin, MD;  Location: [facility];  Service: Urology;  Laterality: Right;  . HARDWARE REMOVAL  10/05/2012   Procedure: HARDWARE REMOVAL;  Surgeon: [name], MD;  Location: [facility];  Service: Orthopedics;  Laterality: Right;  REMOVING  STRYKER  GAMMA NAIL  . HARDWARE REMOVAL Right 07/03/2013   Procedure: HARDWARE REMOVAL RIGHT TIBIA ;  Surgeon: Rozanna Box, MD;  Location: [place];  Service: Orthopedics;  Laterality: Right;  . HIP CLOSED REDUCTION Right 10/15/2013   Procedure: CLOSED MANIPULATION HIP;  Surgeon: [name], MD;  Location: [facility];  Service: Orthopedics;  Laterality: Right;  . hip revision  June 2017  . HOLMIUM LASER APPLICATION Right 01/12/101   Procedure: HOLMIUM LASER APPLICATION;  Surgeon: Cleon Gustin, MD;  Location: [facility];  Service: Urology;  Laterality: Right;  . INCISION AND DRAINAGE HIP Right 11/16/2013   Procedure: IRRIGATION AND DEBRIDEMENT RIGHT HIP;  Surgeon: [name], MD;  Location: [facility];  Service: Orthopedics;  Laterality: Right;  . IR IMAGING GUIDED PORT INSERTION  06/22/2018  . IR URETERAL STENT LEFT NEW ACCESS W/O SEP NEPHROSTOMY CATH  10/24/2017  . JOINT REPLACEMENT     hip-right x2  . KNEE SURGERY Bilateral 2012   Total knee replacements  . NEPHROLITHOTOMY Right 02/08/2017   Procedure: NEPHROLITHOTOMY PERCUTANEOUS WITH SURGEON ACCESS;  Surgeon: Cleon Gustin, MD;   [facility];  Service: Urology;  Laterality: Right;  . NEPHROLITHOTOMY Left 10/24/2017   Procedure: NEPHROLITHOTOMY PERCUTANEOUS;  Surgeon: Cleon Gustin, MD;  [facility];  Service: Urology;  Laterality: Left;  . ORIF TIBIA FRACTURE Right 02/06/2013   Procedure: OPEN REDUCTION INTERNAL FIXATION (ORIF) TIBIA FRACTURE WITH IM ROD FIBULA;  Surgeon: Rozanna Box, MD;  Location: [place];  Service: Orthopedics;  Laterality: Right;  . ORIF TIBIA FRACTURE Right 07/03/2013   Procedure: RIGHT TIBIA NON UNION REPAIR ;  Surgeon: Rozanna Box, MD;  Location: [place];  Service: Orthopedics;  Laterality: Right;  . ORIF WRIST FRACTURE  10/02/2012   Procedure: OPEN REDUCTION INTERNAL FIXATION (ORIF) WRIST FRACTURE;  Surgeon: Roseanne Kaufman, MD;  [facility];  Service: Orthopedics;  Laterality: Right;  WITH   ANTIBIOTIC  CEMENT  . ORIF WRIST FRACTURE Left 10/28/2013   Procedure: OPEN REDUCTION INTERNAL FIXATION (ORIF) WRIST FRACTURE with allograft;  Surgeon: Roseanne Kaufman, MD;  [facility];  Service: Orthopedics;  Laterality: Left;  DVR Plate  . QUADRICEPS TENDON REPAIR Left 07/15/2017   Procedure: REPAIR QUADRICEP TENDON;  Surgeon: Frederik Pear, MD;  Location: [place];  Service: Orthopedics;  Laterality: Left;  . right femur surgery  05/2012  . THYROIDECTOMY  02/1986  . TOTAL HIP REVISION  10/05/2012   Procedure: TOTAL HIP REVISION;  Surgeon: [name], MD;  [facility];  Service: Orthopedics;  Laterality: Right;  RIGHT TOTAL HIP REVISION  . TOTAL HIP REVISION Right 09/17/2013   Procedure: REVISION RIGHT TOTAL HIP ARTHROPLASTY ;  Surgeon: [name], MD;  Location[facility];  Service: Orthopedics;  Laterality: Right;  . TOTAL HIP REVISION Right 10/26/2013   Procedure: REVISION RIGHT TOTAL HIP ARTHROPLASTY;  Surgeon: [name], MD;  Location[facility];  Service: Orthopedics;  Laterality: Right;  . TOTAL HIP REVISION  03/2016  . TOTAL KNEE REVISION Left 04/11/2017   Procedure:  TOTAL KNEE REVISION PATELLA and TIBIA;  Surgeon: Frederik Pear, MD;  Location: [place];  Service: Orthopedics;  Laterality: Left;  . WRIST FRACTURE SURGERY  05/2012    There were no vitals filed for this visit.  Subjective Assessment - 11/02/18 1113    Subjective  Pt states confidence has not come back from the falls.                        [facility] Adult PT Treatment/Exercise - 11/02/18 0001      Balance Poses: Yoga   Warrior I  3 reps   Pt able to do partial knee flexion x 3 for 5 seconds      Exercises   Exercises  Shoulder;Lumbar;Knee/Hip      Lumbar Exercises:  Aerobic   UBE (Upper Arm Bike)  13 minutes at level one      Lumbar Exercises: Seated   Other Seated Lumbar Exercises  sitting on a dynadisc for anterior/ posterior and lateral pelvic tilt 10 reps and also overhead shoulder press with 4# x 10 reps with each arm       Knee/Hip Exercises: Standing   Lateral Step Up  Right;Left;10 reps;Step Height: 2"    Lateral Step Up Limitations  both hands on counter top     Other Standing Knee Exercises  pt with side to side weight shift       Knee/Hip Exercises: Seated   Other Seated Knee/Hip Exercises  hip flexion step up onto footstolol x 10 reps with each leg                  PT Long Term Goals - 09/28/18 1150      PT LONG TERM GOAL #1   Title  Pt will improve sit to stand x 30sec from 21" surface to 8 times     Baseline  09/28/2018  7 times in 30 sec.     Time  6    Period  Weeks    Status  On-going      PT LONG TERM GOAL #2   Title  Pt will improve [place] balance scale to at least 10/40    Time  6    Status  On-going      PT LONG TERM GOAL #3   Title  Pt will be ind with LRAD for home and community use    Baseline  09/28/2018 pt continues to use a RW , he has had episode of fall without hands on walker     Status  Partially Met      PT LONG TERM GOAL #4   Title  Pt will perform TUG with LRAD at 28sec or less     Baseline  on 09/28/2018   18.5 sec     Status  Achieved      PT LONG TERM GOAL #5   Title  Pt will report no instances of Rt LE giving out x 5 days at home     Baseline  still has difficulty with leg giving out     Time  6    Period  Weeks    Status  On-going      PT LONG TERM GOAL #6   Title  Pt be able to perform one SLR on left leg to 8 inches with no terminal extension lag     Time  6    Period  Weeks    Status  New            Plan - 11/02/18 1233    Clinical Impression Statement  added UBE today for aerobic work and pt did well with that.  He had great difficulty trying to sustain warrior I position with knee bent for 5 seconds and had  increaseing fatigue and weakness with each reperition.  He had a near fall at end of these exercised but was able to catch himself with strong upper body.     Clinical Impairments Affecting Rehab Potential  chemotherapy     PT Frequency  2x / week    PT Duration  6 weeks    PT Treatment/Interventions  ADLs/Self Care Home Management;Therapeutic exercise;Patient/family education;Gait training;Stair training;Balance training;Neuromuscular re-education    PT Next Visit Plan  Assess goals Check progress  with HEP, and detemine next exercises in progression (add lateral weight shift) continue to work on strength to left quads, right hip and core.  Activities for hip strategies for balance with cues to keep knees from locking out  update HEP        Patient will benefit from skilled therapeutic intervention in order to improve the following deficits and impairments:  Abnormal gait, Decreased activity tolerance, Decreased endurance, Decreased strength, Decreased balance, Difficulty walking  Visit Diagnosis: Other abnormalities of gait and mobility  History of falling  Muscle weakness (generalized)     Problem List Patient Active Problem List   Diagnosis Date Noted  . Well adult exam 10/02/2018  . Counseling regarding advance care planning and goals of care  07/09/2018  . Diffuse large B-cell lymphoma ([place]) 06/23/2018  . Port-A-Cath in place 06/23/2018  . Hypogonadism in male 11/22/2017  . Nephrolithiasis 10/24/2017  . Recurrent dislocation of left patella 07/15/2017  . Recurrent subluxation of patella, left 07/08/2017  . S/P revision of total knee 04/11/2017  . Failed total knee, left, initial encounter ([place]) 04/09/2017  . Renal calculus, right 02/08/2017  . Protein-calorie malnutrition, severe ([place]) 12/08/2016  . Chronic obstructive pulmonary disease ([place]) 12/08/2016  . Staghorn renal calculus 11/05/2016  . Wound drainage right hip 11/15/2013  . Expected blood loss anemia 09/18/2013  . Hyponatremia 09/18/2013  . S/P right TH revision 09/17/2013  . Nonunion, fracture, Right tibia  07/03/2013  . UTI (urinary tract infection) 07/03/2013  . Pathological fracture due to osteoporosis with delayed healing 04/24/2013  . Osteoporosis with fracture 04/24/2013  . Rash and nonspecific skin eruption 03/08/2013  . Syncope 02/21/2013  . PAF (paroxysmal atrial fibrillation) ([place]) 02/21/2013  . Chronic anticoagulation 02/21/2013  . Fall 02/08/2013  . Physical deconditioning 02/08/2013  . Low back pain radiating to both legs 02/08/2013  . Compression fracture of L1 lumbar vertebra (HCC) 02/08/2013  . Right tibial fracture 02/05/2013  . Obesity, unspecified 02/05/2013  . Osteomyelitis of right wrist ([place]) 11/02/2012  . Anemia 10/06/2012  . HTN (hypertension) 10/03/2012  . Psoriasis 10/03/2012  . Dyslipidemia 10/03/2012  . Vitamin D deficiency 10/03/2012  . GERD (gastroesophageal reflux disease) 10/03/2012  . Hyperglycemia 10/03/2012  . Anxiety 10/03/2012  . Depression 10/03/2012  . CAD (coronary artery disease) 10/03/2012  . DJD (degenerative joint disease) 10/03/2012  . Chronic gingivitis 10/03/2012  . Low testosterone, possible hypogonadism 10/01/2012  . Normocytic anemia 09/30/2012  . Right femoral fracture ([place]) 09/29/2012  . Right  forearm fracture 09/29/2012  . Atrial fibrillation ([place]) 09/29/2012  . Hypothyroidism 09/29/2012  . History of recurrent UTIs 09/29/2012   Donato Heinz. Owens Shark PT  Norwood Levo 11/02/2018, 12:36 PM  [place], [place], [address] Phone: [phone]   Fax:  [phone]  Name: [name] MRN: [id number] Date of Birth: 13-Dec-1945

## 2018-11-07 ENCOUNTER — Ambulatory Visit: Payer: Medicare Other

## 2018-11-07 DIAGNOSIS — Z9181 History of falling: Secondary | ICD-10-CM

## 2018-11-07 DIAGNOSIS — R2689 Other abnormalities of gait and mobility: Secondary | ICD-10-CM | POA: Diagnosis not present

## 2018-11-07 DIAGNOSIS — M6281 Muscle weakness (generalized): Secondary | ICD-10-CM

## 2018-11-07 NOTE — Therapy (Signed)
[place], [place], [address] Phone: [phone]   Fax:  [phone]  Physical Therapy Treatment  Patient Details  Name: [name] MRN: [id number] Date of Birth: [birth year] Referring Provider (PT): Dr. Irene Limbo   Encounter Date: 11/07/2018  PT End of Session - 11/07/18 1206    Visit Number  17    Number of Visits  24    Date for PT Re-Evaluation  11/10/18    PT Start Time  1108    PT Stop Time  1157    PT Time Calculation (min)  49 min    Activity Tolerance  Patient tolerated treatment well    Behavior During Therapy  [facility] for tasks assessed/performed       Past Medical History:  Diagnosis Date  . A-fib (HCC)    hx.- sinus rhythm now  . Acid reflux   . AKI (acute kidney injury) (HCC)    mild  . Anxiety   . Arthritis   . Atrial dilatation, bilateral    Severly, enlargement  per ECHO 11/03/16  . BPH (benign prostatic hyperplasia)   . CAD (coronary artery disease)   . Depression   . Dislocation of internal right hip prosthesis, sequela   . Dysrhythmia    hx a fib  . Elevated brain natriuretic peptide (BNP) level   . Environmental allergies   . Fracture of wrist 10/23/13   LEFT  . High cholesterol   . History of Graves' disease   . History of kidney stones    surg. removal 02/2017  . History of syncope   . HLD (hyperlipidemia)   . Hypertension   . Hypothyroidism    thyroid removed  . Left knee injury    cap dislocated  . Low testosterone, possible hypogonadism 10/01/2012  . Lumbago   . Metabolic bone disease   . Multiple falls   . Osteoporosis    Severe  . Plaque psoriasis   . Sleep apnea    cpap machine-settings 17  . Thrombocytopenia ([place])   . Thyroid disease    HX GRAVES DISEASE  . Transfusion history    s/p 12'13 hip surgery  . Unsteady gait   . UTI (urinary tract infection)   . Vertebral compression fracture (HCC)    L1-wears brace    Past Surgical History:   Procedure Laterality Date  . APPENDECTOMY    . CARDIAC CATHETERIZATION  2013  . CATARACT EXTRACTION, BILATERAL    . COLONOSCOPY    . CYSTOSCOPY WITH RETROGRADE PYELOGRAM, URETEROSCOPY AND STENT PLACEMENT Left 11/07/2017   Procedure: CYSTOSCOPY WITH RETROGRADE PYELOGRAM, URETEROSCOPY AND STENT PLACEMENT;  Surgeon: Cleon Gustin, MD;  Location: [facility];  Service: Urology;  Laterality: Left;  . CYSTOSCOPY/URETEROSCOPY/HOLMIUM LASER/STENT PLACEMENT Right 03/11/2017   Procedure: CYSTOSCOPY/URETEROSCOPYSTENT PLACEMENT right ureter retrograde pylegram;  Surgeon: Cleon Gustin, MD;  Location: [facility];  Service: Urology;  Laterality: Right;  . HARDWARE REMOVAL  10/05/2012   Procedure: HARDWARE REMOVAL;  Surgeon: [name], MD;  Location: [facility];  Service: Orthopedics;  Laterality: Right;  REMOVING  STRYKER  GAMMA NAIL  . HARDWARE REMOVAL Right 07/03/2013   Procedure: HARDWARE REMOVAL RIGHT TIBIA ;  Surgeon: Rozanna Box, MD;  Location: [place];  Service: Orthopedics;  Laterality: Right;  . HIP CLOSED REDUCTION Right 10/15/2013   Procedure: CLOSED MANIPULATION HIP;  Surgeon: [name], MD;  Location: [facility];  Service: Orthopedics;  Laterality: Right;  . hip revision  June 2017  . HOLMIUM LASER APPLICATION Right 02/13/3747   Procedure: HOLMIUM LASER APPLICATION;  Surgeon: Cleon Gustin, MD;  Location: [facility];  Service: Urology;  Laterality: Right;  . INCISION AND DRAINAGE HIP Right 11/16/2013   Procedure: IRRIGATION AND DEBRIDEMENT RIGHT HIP;  Surgeon: [name], MD;  Location: [facility];  Service: Orthopedics;  Laterality: Right;  . IR IMAGING GUIDED PORT INSERTION  06/22/2018  . IR URETERAL STENT LEFT NEW ACCESS W/O SEP NEPHROSTOMY CATH  10/24/2017  . JOINT REPLACEMENT     hip-right x2  . KNEE SURGERY Bilateral 2012   Total knee replacements  . NEPHROLITHOTOMY Right 02/08/2017   Procedure: NEPHROLITHOTOMY PERCUTANEOUS WITH SURGEON ACCESS;  Surgeon: Cleon Gustin, MD;   Location[facility];  Service: Urology;  Laterality: Right;  . NEPHROLITHOTOMY Left 10/24/2017   Procedure: NEPHROLITHOTOMY PERCUTANEOUS;  Surgeon: Cleon Gustin, MD;  [facility];  Service: Urology;  Laterality: Left;  . ORIF TIBIA FRACTURE Right 02/06/2013   Procedure: OPEN REDUCTION INTERNAL FIXATION (ORIF) TIBIA FRACTURE WITH IM ROD FIBULA;  Surgeon: Rozanna Box, MD;  Location: [facility];  Service: Orthopedics;  Laterality: Right;  . ORIF TIBIA FRACTURE Right 07/03/2013   Procedure: RIGHT TIBIA NON UNION REPAIR ;  Surgeon: Rozanna Box, MD;  Location: [place];  Service: Orthopedics;  Laterality: Right;  . ORIF WRIST FRACTURE  10/02/2012   Procedure: OPEN REDUCTION INTERNAL FIXATION (ORIF) WRIST FRACTURE;  Surgeon: Roseanne Kaufman, MD;  Location: [facility];  Service: Orthopedics;  Laterality: Right;  WITH   ANTIBIOTIC  CEMENT  . ORIF WRIST FRACTURE Left 10/28/2013   Procedure: OPEN REDUCTION INTERNAL FIXATION (ORIF) WRIST FRACTURE with allograft;  Surgeon: Roseanne Kaufman, MD;  Location: [facility];  Service: Orthopedics;  Laterality: Left;  DVR Plate  . QUADRICEPS TENDON REPAIR Left 07/15/2017   Procedure: REPAIR QUADRICEP TENDON;  Surgeon: Frederik Pear, MD;  Location: [place];  Service: Orthopedics;  Laterality: Left;  . right femur surgery  05/2012  . THYROIDECTOMY  02/1986  . TOTAL HIP REVISION  10/05/2012   Procedure: TOTAL HIP REVISION;  Surgeon: [name], MD;  Location: [facility];  Service: Orthopedics;  Laterality: Right;  RIGHT TOTAL HIP REVISION  . TOTAL HIP REVISION Right 09/17/2013   Procedure: REVISION RIGHT TOTAL HIP ARTHROPLASTY ;  Surgeon: [name], MD;  [facility]: [facility];  Service: Orthopedics;  Laterality: Right;  . TOTAL HIP REVISION Right 10/26/2013   Procedure: REVISION RIGHT TOTAL HIP ARTHROPLASTY;  Surgeon: [name], MD;  Location: [facility];  Service: Orthopedics;  Laterality: Right;  . TOTAL HIP REVISION  03/2016  . TOTAL KNEE REVISION Left 04/11/2017   Procedure:  TOTAL KNEE REVISION PATELLA and TIBIA;  Surgeon: Frederik Pear, MD;  Location: [place];  Service: Orthopedics;  Laterality: Left;  . WRIST FRACTURE SURGERY  05/2012    There were no vitals filed for this visit.  Subjective Assessment - 11/07/18 1117    Subjective  It's been a week since I last fell. I still have to be really careful as I don't trust that Rt knee.     Pertinent History  2013 fall resulting in a femur break on the Rt leg resulting in multiple surgeries due to failures including hip replacements another broken tibia leading to the discovery of osteoporosis and a metabolic bone disease.  After forteo injections x 2 years he is now in the osteopenia category.  Has a new scan coming up in a  few weeks.  prior to the cancer diagnosis he was able to be confident with a cane.  Then was diagnosed with diffuse non-hogkin large cell B-lymphoma 07/09/18 located in tonsils and Rt groin. Treatment consists of an infusion every 3 weeks hopefully for a total of 5, A-fib controlled, HTN controlled with medication    Patient Stated Goals  get my balance back    Currently in Pain?  Yes    Pain Score  5     Pain Location  Leg    Pain Orientation  Right;Left    Pain Descriptors / Indicators  Aching    Pain Onset  More than a month ago    Pain Frequency  Intermittent    Aggravating Factors   prolonged sitting, increase fatigue    Pain Relieving Factors  resting, meds                       [facility] Adult PT Treatment/Exercise - 11/07/18 0001      Neuro Re-ed    Neuro Re-ed Details   In // bars: Weight shifting front to back with each leg, then side-side all with VCs and demo for correct pattern and muscular contraction      Lumbar Exercises: Aerobic   UBE (Upper Arm Bike)  Level 2 x13 mins (7:30 FWD, 5:30 mins BKWD) PTA present to montior pt with increased resistance      Knee/Hip Exercises: Stretches   Sports administrator  Right;Left;2 reps;30 seconds   seated EOB with heel underneath and  PTA lowering mat     Knee/Hip Exercises: Seated   Sit to Sand  with UE support;Other (comment)   attempted this but pt unable to do today                 PT Long Term Goals - 11/07/18 1136      PT LONG TERM GOAL #1   Title  Pt will improve sit to stand x 30sec from 21" surface to 8 times     Baseline  09/28/2018  7 times in 30 sec.     Status  On-going      PT LONG TERM GOAL #2   Title  Pt will improve [place] balance scale to at least 10/40    Status  On-going      PT LONG TERM GOAL #3   Title  Pt will be ind with LRAD for home and community use    Baseline  09/28/2018 pt continues to use a RW , he has had episode of fall without hands on walker; still with use of walker 100% of time-11/07/18     Status  Partially Met            Plan - 11/07/18 1207    Clinical Impression Statement  Continued with UBE today progressing pt with increased resistance and having pt change directions using different muscle groups; he tolerated this well. Had pt in // bars as he reports still nervous from LOB at last session. Continued with work on weight shifting reviewing how this is going at home and added side-side weight shifting. Pt reports this makes him feels nervous however he was pleased at how well he was able to do with this. Pt became very fatigued with walking from one side of building to the other. So when attempted to retest sit-stand in 30 sec but pt unable to do this today due to legs beginning to feel weaker. Ended session with  quad stretches as he reports his thighs feeling tight after walking during session.    Rehab Potential  Good    Clinical Impairments Affecting Rehab Potential  chemotherapy     PT Frequency  2x / week    PT Duration  6 weeks    PT Treatment/Interventions  ADLs/Self Care Home Management;Therapeutic exercise;Patient/family education;Gait training;Stair training;Balance training;Neuromuscular re-education    PT Next Visit Plan  Assess goals: try  sit-stnad at beginning of session and retest [place]. Check progress with HEP, and detemine next exercises in progression (add lateral weight shift to HEP as he did well with this today) continue to work on strength to left quads, right hip and core (add lumbar stabs series??). Activities for hip strategies for balance with cues to keep knees from locking out; update HEP     Consulted and Agree with Plan of Care  Patient       Patient will benefit from skilled therapeutic intervention in order to improve the following deficits and impairments:  Abnormal gait, Decreased activity tolerance, Decreased endurance, Decreased strength, Decreased balance, Difficulty walking  Visit Diagnosis: Other abnormalities of gait and mobility  History of falling  Muscle weakness (generalized)     Problem List Patient Active Problem List   Diagnosis Date Noted  . Well adult exam 10/02/2018  . Counseling regarding advance care planning and goals of care 07/09/2018  . Diffuse large B-cell lymphoma ([place]) 06/23/2018  . Port-A-Cath in place 06/23/2018  . Hypogonadism in male 11/22/2017  . Nephrolithiasis 10/24/2017  . Recurrent dislocation of left patella 07/15/2017  . Recurrent subluxation of patella, left 07/08/2017  . S/P revision of total knee 04/11/2017  . Failed total knee, left, initial encounter ([place]) 04/09/2017  . Renal calculus, right 02/08/2017  . Protein-calorie malnutrition, severe ([place]) 12/08/2016  . Chronic obstructive pulmonary disease ([place]) 12/08/2016  . Staghorn renal calculus 11/05/2016  . Wound drainage right hip 11/15/2013  . Expected blood loss anemia 09/18/2013  . Hyponatremia 09/18/2013  . S/P right TH revision 09/17/2013  . Nonunion, fracture, Right tibia  07/03/2013  . UTI (urinary tract infection) 07/03/2013  . Pathological fracture due to osteoporosis with delayed healing 04/24/2013  . Osteoporosis with fracture 04/24/2013  . Rash and nonspecific skin eruption  03/08/2013  . Syncope 02/21/2013  . PAF (paroxysmal atrial fibrillation) ([place]) 02/21/2013  . Chronic anticoagulation 02/21/2013  . Fall 02/08/2013  . Physical deconditioning 02/08/2013  . Low back pain radiating to both legs 02/08/2013  . Compression fracture of L1 lumbar vertebra (HCC) 02/08/2013  . Right tibial fracture 02/05/2013  . Obesity, unspecified 02/05/2013  . Osteomyelitis of right wrist ([place]) 11/02/2012  . Anemia 10/06/2012  . HTN (hypertension) 10/03/2012  . Psoriasis 10/03/2012  . Dyslipidemia 10/03/2012  . Vitamin D deficiency 10/03/2012  . GERD (gastroesophageal reflux disease) 10/03/2012  . Hyperglycemia 10/03/2012  . Anxiety 10/03/2012  . Depression 10/03/2012  . CAD (coronary artery disease) 10/03/2012  . DJD (degenerative joint disease) 10/03/2012  . Chronic gingivitis 10/03/2012  . Low testosterone, possible hypogonadism 10/01/2012  . Normocytic anemia 09/30/2012  . Right femoral fracture ([place]) 09/29/2012  . Right forearm fracture 09/29/2012  . Atrial fibrillation ([place]) 09/29/2012  . Hypothyroidism 09/29/2012  . History of recurrent UTIs 09/29/2012    Otelia Limes, PTA 11/07/2018, 12:23 PM  [place], [place], [address] Phone: [phone]   Fax:  [phone]  Name: [name] MRN: [id number] Date of Birth: 1946/02/16

## 2018-11-08 ENCOUNTER — Emergency Department (HOSPITAL_COMMUNITY): Payer: Medicare Other

## 2018-11-08 ENCOUNTER — Observation Stay (HOSPITAL_COMMUNITY)
Admission: EM | Admit: 2018-11-08 | Discharge: 2018-11-11 | Disposition: A | Discharge status: Skilled Nursing Facility | Payer: Medicare Other | Attending: Internal Medicine | Admitting: Internal Medicine

## 2018-11-08 ENCOUNTER — Encounter (HOSPITAL_COMMUNITY): Payer: Self-pay

## 2018-11-08 ENCOUNTER — Other Ambulatory Visit: Payer: Self-pay

## 2018-11-08 DIAGNOSIS — I10 Essential (primary) hypertension: Secondary | ICD-10-CM | POA: Diagnosis present

## 2018-11-08 DIAGNOSIS — I480 Paroxysmal atrial fibrillation: Secondary | ICD-10-CM | POA: Diagnosis not present

## 2018-11-08 DIAGNOSIS — R531 Weakness: Principal | ICD-10-CM

## 2018-11-08 DIAGNOSIS — E039 Hypothyroidism, unspecified: Secondary | ICD-10-CM | POA: Diagnosis not present

## 2018-11-08 DIAGNOSIS — R319 Hematuria, unspecified: Secondary | ICD-10-CM | POA: Diagnosis present

## 2018-11-08 DIAGNOSIS — E7800 Pure hypercholesterolemia, unspecified: Secondary | ICD-10-CM | POA: Diagnosis not present

## 2018-11-08 DIAGNOSIS — K219 Gastro-esophageal reflux disease without esophagitis: Secondary | ICD-10-CM | POA: Insufficient documentation

## 2018-11-08 DIAGNOSIS — M1990 Unspecified osteoarthritis, unspecified site: Secondary | ICD-10-CM | POA: Insufficient documentation

## 2018-11-08 DIAGNOSIS — C8590 Non-Hodgkin lymphoma, unspecified, unspecified site: Secondary | ICD-10-CM | POA: Diagnosis not present

## 2018-11-08 DIAGNOSIS — E0500 Thyrotoxicosis with diffuse goiter without thyrotoxic crisis or storm: Secondary | ICD-10-CM | POA: Diagnosis not present

## 2018-11-08 DIAGNOSIS — D649 Anemia, unspecified: Secondary | ICD-10-CM | POA: Diagnosis not present

## 2018-11-08 DIAGNOSIS — Z87442 Personal history of urinary calculi: Secondary | ICD-10-CM | POA: Diagnosis not present

## 2018-11-08 DIAGNOSIS — N400 Enlarged prostate without lower urinary tract symptoms: Secondary | ICD-10-CM | POA: Diagnosis not present

## 2018-11-08 DIAGNOSIS — F419 Anxiety disorder, unspecified: Secondary | ICD-10-CM | POA: Insufficient documentation

## 2018-11-08 DIAGNOSIS — I2510 Atherosclerotic heart disease of native coronary artery without angina pectoris: Secondary | ICD-10-CM | POA: Diagnosis not present

## 2018-11-08 DIAGNOSIS — E785 Hyperlipidemia, unspecified: Secondary | ICD-10-CM | POA: Insufficient documentation

## 2018-11-08 DIAGNOSIS — Z79890 Hormone replacement therapy (postmenopausal): Secondary | ICD-10-CM | POA: Insufficient documentation

## 2018-11-08 DIAGNOSIS — F329 Major depressive disorder, single episode, unspecified: Secondary | ICD-10-CM | POA: Insufficient documentation

## 2018-11-08 DIAGNOSIS — Z79899 Other long term (current) drug therapy: Secondary | ICD-10-CM | POA: Insufficient documentation

## 2018-11-08 DIAGNOSIS — N390 Urinary tract infection, site not specified: Secondary | ICD-10-CM | POA: Diagnosis present

## 2018-11-08 DIAGNOSIS — Z7901 Long term (current) use of anticoagulants: Secondary | ICD-10-CM | POA: Insufficient documentation

## 2018-11-08 DIAGNOSIS — G4730 Sleep apnea, unspecified: Secondary | ICD-10-CM | POA: Insufficient documentation

## 2018-11-08 HISTORY — DX: Other abnormalities of gait and mobility: R26.89

## 2018-11-08 HISTORY — DX: Malignant (primary) neoplasm, unspecified: C80.1

## 2018-11-08 LAB — URINALYSIS, ROUTINE W REFLEX MICROSCOPIC
BILIRUBIN URINE: NEGATIVE
Glucose, UA: NEGATIVE mg/dL
KETONES UR: NEGATIVE mg/dL
Nitrite: NEGATIVE
PROTEIN: 100 mg/dL — AB
RBC / HPF: >50 RBC/hpf — ABNORMAL HIGH (ref 0–5)
Specific Gravity, Urine: 1.016 (ref 1.005–1.030)
WBC, UA: >50 WBC/hpf — ABNORMAL HIGH (ref 0–5)
pH: 6 (ref 5.0–8.0)

## 2018-11-08 LAB — CBC
HCT: 36.1 % — ABNORMAL LOW (ref 39.0–52.0)
Hemoglobin: 11.4 g/dL — ABNORMAL LOW (ref 13.0–17.0)
MCH: 33.2 pg (ref 26.0–34.0)
MCHC: 31.6 g/dL (ref 30.0–36.0)
MCV: 105.2 fL — AB (ref 80.0–100.0)
Platelets: 173 10*3/uL (ref 150–400)
RBC: 3.43 MIL/uL — ABNORMAL LOW (ref 4.22–5.81)
RDW: 15.1 % (ref 11.5–15.5)
WBC: 6.7 10*3/uL (ref 4.0–10.5)
nRBC: 0 % (ref 0.0–0.2)

## 2018-11-08 LAB — BASIC METABOLIC PANEL
Anion gap: 8 (ref 5–15)
BUN: 24 mg/dL — ABNORMAL HIGH (ref 8–23)
CO2: 27 mmol/L (ref 22–32)
Calcium: 9.3 mg/dL (ref 8.9–10.3)
Chloride: 101 mmol/L (ref 98–111)
Creatinine, Ser: 1.25 mg/dL — ABNORMAL HIGH (ref 0.61–1.24)
GFR calc Af Amer: >60 mL/min (ref >60)
GFR calc non Af Amer: 57 mL/min — ABNORMAL LOW (ref >60)
Glucose, Bld: 100 mg/dL — ABNORMAL HIGH (ref 70–99)
Potassium: 4.9 mmol/L (ref 3.5–5.1)
Sodium: 136 mmol/L (ref 135–145)

## 2018-11-08 LAB — DIFFERENTIAL
Abs Immature Granulocytes: 0.06 10*3/uL (ref 0.00–0.07)
Basophils Absolute: 0 10*3/uL (ref 0.0–0.1)
Basophils Relative: 1 %
EOS PCT: 4 %
Eosinophils Absolute: 0.3 10*3/uL (ref 0.0–0.5)
Immature Granulocytes: 1 %
Lymphocytes Relative: 13 %
Lymphs Abs: 0.9 10*3/uL (ref 0.7–4.0)
Monocytes Absolute: 0.7 10*3/uL (ref 0.1–1.0)
Monocytes Relative: 10 %
Neutro Abs: 4.7 10*3/uL (ref 1.7–7.7)
Neutrophils Relative %: 71 %

## 2018-11-08 LAB — CBG MONITORING, ED: Glucose-Capillary: 89 mg/dL (ref 70–99)

## 2018-11-08 LAB — TROPONIN I: Troponin I: 0.04 ng/mL (ref <0.03)

## 2018-11-08 LAB — LACTIC ACID, PLASMA: Lactic Acid, Venous: 1.4 mmol/L (ref 0.5–1.9)

## 2018-11-08 MED ORDER — SODIUM CHLORIDE 0.9 % IV BOLUS
500.0000 mL | Freq: Once | INTRAVENOUS | Status: AC
  Administered 2018-11-08: 500 mL via INTRAVENOUS

## 2018-11-08 MED ORDER — SODIUM CHLORIDE 0.9% FLUSH: 3.0000 mL | Freq: Once | INTRAVENOUS | Status: DC

## 2018-11-08 MED ORDER — SODIUM CHLORIDE 0.9 % IV SOLN
1.0000 g | Freq: Once | INTRAVENOUS | Status: AC
  Administered 2018-11-09: 1 g via INTRAVENOUS
  Filled 2018-11-08: qty 10

## 2018-11-08 NOTE — ED Notes (Signed)
Fluids started lactic lab to be drawn as soon as fluid finish

## 2018-11-08 NOTE — ED Triage Notes (Signed)
Pt arrived via EMS from home. Pt reports increased  weakness. Pt has been having treatment for lymphoma and had chemo a week ago. Pt reports that his legs have been giving out on him. Pt had episode today in which he was able to lower himself to the floor , no falls or injuries are noted    Pt 20G left hand EMS v/s 150/70, HR 90, RR 18, O2 sat 98%

## 2018-11-08 NOTE — ED Notes (Signed)
Bed: WA13 Expected date:  Expected time:  Means of arrival:  Comments: EMS-weakness 

## 2018-11-08 NOTE — ED Notes (Addendum)
Trop of 0.04 reported to [employer] and [facility] via secured message

## 2018-11-08 NOTE — ED Notes (Signed)
Pt unsteady with walker , pt back in bed

## 2018-11-08 NOTE — ED Provider Notes (Signed)
[facility] DEPT Provider Note   CSN: [id number] Arrival date & time: 11/08/18  2025     History   Chief Complaint Chief Complaint  Patient presents with  . Weakness    HPI [name] is a 73 y.o. male.   Weakness    Pt was seen at 2110. Per EMS, pt's family and pt report: Pt c/o sudden onset and resolution of one episode of "lowering myself to the floor" that occurred PTA. Pt states he has hx of chronic/recurrent bilat LE's "giving out" since femur fracture/multiple surgeries in 2013. Pt states he has hx of gait instability, frequent falls, and chronic "legs giving out;" currently in outpatient rehab for same. Pt states he had another "episode" PTA. Pt states he was walking into his house when his "legs got weak" and his family helped lower him to the ground. Pt denies any injuries. Endorses hx lymphoma, LD chemo 4 weeks ago. Pt also c/o "foul smell to urine" for the last few weeks. Denies hematuria, no perineal pain, no flank pain. Denies injury. Denies hitting head, no LOC, no AMS, no neck or back pain, no abd pain, no N/V/D, no CP/SOB, no cough, no fevers, no focal motor weakness, no tingling/numbness in extremities.    Past Medical History:  Diagnosis Date  . A-fib (HCC)    hx.- sinus rhythm now  . Acid reflux   . AKI (acute kidney injury) (HCC)    mild  . Anxiety   . Arthritis   . Atrial dilatation, bilateral    Severly, enlargement  per ECHO 11/03/16  . BPH (benign prostatic hyperplasia)   . CAD (coronary artery disease)   . Cancer ([place])   . Depression   . Dislocation of internal right hip prosthesis, sequela   . Dysrhythmia    hx a fib  . Elevated brain natriuretic peptide (BNP) level   . Environmental allergies   . Fracture of wrist 10/23/13   LEFT  . High cholesterol   . History of Graves' disease   . History of kidney stones    surg. removal 02/2017  . History of syncope   . HLD (hyperlipidemia)   . Hypertension   .  Hypothyroidism    thyroid removed  . Left knee injury    cap dislocated  . Low testosterone, possible hypogonadism 10/01/2012  . Lumbago   . Metabolic bone disease   . Multiple falls   . Osteoporosis    Severe  . Plaque psoriasis   . Sleep apnea    cpap machine-settings 17  . Thrombocytopenia ([place])   . Thyroid disease    HX GRAVES DISEASE  . Transfusion history    s/p 12'13 hip surgery  . Unsteady gait   . UTI (urinary tract infection)   . Vertebral compression fracture (HCC)    L1-wears brace    Patient Active Problem List   Diagnosis Date Noted  . Well adult exam 10/02/2018  . Counseling regarding advance care planning and goals of care 07/09/2018  . Diffuse large B-cell lymphoma ([place]) 06/23/2018  . Port-A-Cath in place 06/23/2018  . Hypogonadism in male 11/22/2017  . Nephrolithiasis 10/24/2017  . Recurrent dislocation of left patella 07/15/2017  . Recurrent subluxation of patella, left 07/08/2017  . S/P revision of total knee 04/11/2017  . Failed total knee, left, initial encounter ([place]) 04/09/2017  . Renal calculus, right 02/08/2017  . Protein-calorie malnutrition, severe ([place]) 12/08/2016  . Chronic obstructive pulmonary disease ([place]) 12/08/2016  .  Staghorn renal calculus 11/05/2016  . Wound drainage right hip 11/15/2013  . Expected blood loss anemia 09/18/2013  . Hyponatremia 09/18/2013  . S/P right TH revision 09/17/2013  . Nonunion, fracture, Right tibia  07/03/2013  . UTI (urinary tract infection) 07/03/2013  . Pathological fracture due to osteoporosis with delayed healing 04/24/2013  . Osteoporosis with fracture 04/24/2013  . Rash and nonspecific skin eruption 03/08/2013  . Syncope 02/21/2013  . PAF (paroxysmal atrial fibrillation) ([place]) 02/21/2013  . Chronic anticoagulation 02/21/2013  . Fall 02/08/2013  . Physical deconditioning 02/08/2013  . Low back pain radiating to both legs 02/08/2013  . Compression fracture of L1 lumbar vertebra (HCC) 02/08/2013   . Right tibial fracture 02/05/2013  . Obesity, unspecified 02/05/2013  . Osteomyelitis of right wrist ([place]) 11/02/2012  . Anemia 10/06/2012  . HTN (hypertension) 10/03/2012  . Psoriasis 10/03/2012  . Dyslipidemia 10/03/2012  . Vitamin D deficiency 10/03/2012  . GERD (gastroesophageal reflux disease) 10/03/2012  . Hyperglycemia 10/03/2012  . Anxiety 10/03/2012  . Depression 10/03/2012  . CAD (coronary artery disease) 10/03/2012  . DJD (degenerative joint disease) 10/03/2012  . Chronic gingivitis 10/03/2012  . Low testosterone, possible hypogonadism 10/01/2012  . Normocytic anemia 09/30/2012  . Right femoral fracture ([place]) 09/29/2012  . Right forearm fracture 09/29/2012  . Atrial fibrillation ([place]) 09/29/2012  . Hypothyroidism 09/29/2012  . History of recurrent UTIs 09/29/2012    Past Surgical History:  Procedure Laterality Date  . APPENDECTOMY    . CARDIAC CATHETERIZATION  2013  . CATARACT EXTRACTION, BILATERAL    . COLONOSCOPY    . CYSTOSCOPY WITH RETROGRADE PYELOGRAM, URETEROSCOPY AND STENT PLACEMENT Left 11/07/2017   Procedure: CYSTOSCOPY WITH RETROGRADE PYELOGRAM, URETEROSCOPY AND STENT PLACEMENT;  Surgeon: Cleon Gustin, MD;  Location: [facility];  Service: Urology;  Laterality: Left;  . CYSTOSCOPY/URETEROSCOPY/HOLMIUM LASER/STENT PLACEMENT Right 03/11/2017   Procedure: CYSTOSCOPY/URETEROSCOPYSTENT PLACEMENT right ureter retrograde pylegram;  Surgeon: Cleon Gustin, MD;  Location: [facility];  Service: Urology;  Laterality: Right;  . HARDWARE REMOVAL  10/05/2012   Procedure: HARDWARE REMOVAL;  Surgeon: [name], MD;  Location: [facility];  Service: Orthopedics;  Laterality: Right;  REMOVING  STRYKER  GAMMA NAIL  . HARDWARE REMOVAL Right 07/03/2013   Procedure: HARDWARE REMOVAL RIGHT TIBIA ;  Surgeon: Rozanna Box, MD;  Location: [facility];  Service: Orthopedics;  Laterality: Right;  . HIP CLOSED REDUCTION Right 10/15/2013   Procedure: CLOSED MANIPULATION HIP;  Surgeon:  [name], MD;  Location: [facility];  Service: Orthopedics;  Laterality: Right;  . hip revision     June 2017  . HOLMIUM LASER APPLICATION Right 06/15/1739   Procedure: HOLMIUM LASER APPLICATION;  Surgeon: Cleon Gustin, MD;  Location: [facility];  Service: Urology;  Laterality: Right;  . INCISION AND DRAINAGE HIP Right 11/16/2013   Procedure: IRRIGATION AND DEBRIDEMENT RIGHT HIP;  Surgeon: [name], MD;  Location: [facility];  Service: Orthopedics;  Laterality: Right;  . IR IMAGING GUIDED PORT INSERTION  06/22/2018  . IR URETERAL STENT LEFT NEW ACCESS W/O SEP NEPHROSTOMY CATH  10/24/2017  . JOINT REPLACEMENT     hip-right x2  . KNEE SURGERY Bilateral 2012   Total knee replacements  . NEPHROLITHOTOMY Right 02/08/2017   Procedure: NEPHROLITHOTOMY PERCUTANEOUS WITH SURGEON ACCESS;  Surgeon: Cleon Gustin, MD;  [facility];  Service: Urology;  Laterality: Right;  . NEPHROLITHOTOMY Left 10/24/2017   Procedure: NEPHROLITHOTOMY PERCUTANEOUS;  Surgeon: Cleon Gustin, MD;  [facility];  Service: Urology;  Laterality: Left;  . ORIF TIBIA FRACTURE Right 02/06/2013   Procedure: OPEN REDUCTION INTERNAL FIXATION (ORIF) TIBIA FRACTURE WITH IM ROD FIBULA;  Surgeon: Rozanna Box, MD;  Location: [facility];  Service: Orthopedics;  Laterality: Right;  . ORIF TIBIA FRACTURE Right 07/03/2013   Procedure: RIGHT TIBIA NON UNION REPAIR ;  Surgeon: Rozanna Box, MD;  Location: [facility];  Service: Orthopedics;  Laterality: Right;  . ORIF WRIST FRACTURE  10/02/2012   Procedure: OPEN REDUCTION INTERNAL FIXATION (ORIF) WRIST FRACTURE;  Surgeon: Roseanne Kaufman, MD;  [facility];  Service: Orthopedics;  Laterality: Right;  WITH   ANTIBIOTIC  CEMENT  . ORIF WRIST FRACTURE Left 10/28/2013   Procedure: OPEN REDUCTION INTERNAL FIXATION (ORIF) WRIST FRACTURE with allograft;  Surgeon: Roseanne Kaufman, MD;  [facility];  Service: Orthopedics;  Laterality: Left;  DVR Plate  . QUADRICEPS TENDON REPAIR Left  07/15/2017   Procedure: REPAIR QUADRICEP TENDON;  Surgeon: Frederik Pear, MD;  Location: [facility];  Service: Orthopedics;  Laterality: Left;  . right femur surgery  05/2012  . THYROIDECTOMY  02/1986  . TOTAL HIP REVISION  10/05/2012   Procedure: TOTAL HIP REVISION;  Surgeon: [name], MD;  [facility];  Service: Orthopedics;  Laterality: Right;  RIGHT TOTAL HIP REVISION  . TOTAL HIP REVISION Right 09/17/2013   Procedure: REVISION RIGHT TOTAL HIP ARTHROPLASTY ;  Surgeon: [name], MD;  [facility];  Service: Orthopedics;  Laterality: Right;  . TOTAL HIP REVISION Right 10/26/2013   Procedure: REVISION RIGHT TOTAL HIP ARTHROPLASTY;  Surgeon: [name], MD;  [facility];  Service: Orthopedics;  Laterality: Right;  . TOTAL HIP REVISION  03/2016  . TOTAL KNEE REVISION Left 04/11/2017   Procedure: TOTAL KNEE REVISION PATELLA and TIBIA;  Surgeon: Frederik Pear, MD;  Location: [facility];  Service: Orthopedics;  Laterality: Left;  . WRIST FRACTURE SURGERY  05/2012        Home Medications    Prior to Admission medications   Medication Sig Start Date End Date Taking? Authorizing Provider  alfuzosin (UROXATRAL) 10 MG 24 hr tablet Take 10 mg by mouth at bedtime.    [provider]  amoxicillin (AMOXIL) 500 MG capsule Take 2,000 mg by mouth See admin instructions. Takes 4 capsules 1 hour prior to dental procedures    [provider]  atorvastatin (LIPITOR) 80 MG tablet Take 80 mg by mouth daily.    [provider]  BELSOMRA 15 MG TABS Take 1 tablet by mouth daily. 10/06/18   Plotnikov, Evie Lacks, MD  buPROPion (WELLBUTRIN XL) 300 MG 24 hr tablet TAKE 1 TABLET DAILY WITH  BREAKFAST BY MOUTH. Patient taking differently: Take 300 mg by mouth daily.  05/04/18   Plotnikov, Evie Lacks, MD  Calcium Citrate-Vitamin D (CALCIUM CITRATE + D PO) Take 1 tablet by mouth 2 (two) times daily.     [provider]  Cholecalciferol (VITAMIN D-3 PO) Take 2,000 Units by  mouth 2 (two) times daily.     [provider]  clonazePAM (KLONOPIN) 1 MG tablet Take 1 tablet (1 mg total) by mouth every 6 (six) hours as needed for anxiety. TAKE 1 TABLET BY MOUTH  EVERY 6 HOURS prn 06/25/18   Plotnikov, Evie Lacks, MD  denosumab (PROLIA) 60 MG/ML SOLN injection Inject 60 mg into the skin every 6 (six) months. Administer in upper arm, thigh, or abdomen    [provider]  diltiazem (CARDIZEM  CD) 240 MG 24 hr capsule TAKE 1 CAPSULE BY MOUTH  DAILY 06/13/18   Plotnikov, Evie Lacks, MD  doxycycline (VIBRA-TABS) 100 MG tablet TAKE 1 TABLET BY MOUTH  DAILY 06/25/18   Plotnikov, Evie Lacks, MD  DULoxetine (CYMBALTA) 60 MG capsule TAKE 1 CAPSULE BY MOUTH  TWICE DAILY 06/25/18   Plotnikov, Evie Lacks, MD  EPINEPHrine (EPIPEN 2-PAK) 0.3 mg/0.3 mL IJ SOAJ injection Inject 0.3 mg into the muscle daily as needed (for anaphylactic reaction).    [provider]  etanercept (ENBREL) 50 MG/ML injection Inject 50 mg into the skin 2 (two) times a week. Tuesday & Friday    [provider]  ezetimibe (ZETIA) 10 MG tablet Take 1 tablet (10 mg total) by mouth daily. 08/04/18 11/02/18  Skeet Latch, MD  ferrous sulfate 325 (65 FE) MG tablet Take 325 mg by mouth 2 (two) times daily with a meal.    [provider]  HYDROcodone-acetaminophen (NORCO) 7.5-325 MG tablet Take 1 tablet by mouth 4 (four) times daily as needed for moderate pain. Please fill on 11/02/18 09/06/18   Plotnikov, Evie Lacks, MD  ipratropium (ATROVENT) 0.03 % nasal spray Place 1 spray into the nose daily as needed. 04/21/18   [provider]  levothyroxine (SYNTHROID, LEVOTHROID) 200 MCG tablet TAKE 1 TABLET DAILY AT 6 AM BY MOUTH. Patient taking differently: Take 225 mcg by mouth daily before breakfast.  05/04/18   Plotnikov, Evie Lacks, MD  levothyroxine (SYNTHROID, LEVOTHROID) 25 MCG tablet TAKE 1 TABLET BY MOUTH  DAILY BEFORE BREAKFAST. Patient taking differently: Take 225 mcg by mouth  daily before breakfast.  05/04/18   Plotnikov, Evie Lacks, MD  lidocaine-prilocaine (EMLA) cream Apply to affected area once 06/23/18   Brunetta Genera, MD  loratadine (CLARITIN) 10 MG tablet Take 10 mg by mouth daily.     [provider]  LORazepam (ATIVAN) 0.5 MG tablet Take 1 tablet (0.5 mg total) by mouth every 6 (six) hours as needed (Nausea or vomiting). 06/23/18   Brunetta Genera, MD  Magnesium 500 MG CAPS Take 1 capsule (500 mg total) by mouth daily. 10/13/18   Brunetta Genera, MD  metoprolol tartrate (LOPRESSOR) 25 MG tablet Take 1 tablet (25 mg total) by mouth 2 (two) times daily. 10/23/18 01/21/19  Skeet Latch, MD  mirabegron ER (MYRBETRIQ) 25 MG TB24 tablet Take 25 mg by mouth at bedtime.    [provider]  Multiple Vitamin (MULTIVITAMIN WITH MINERALS) TABS tablet Take 1 tablet by mouth daily. Men's One-A-Day 50+    [provider]  nitroGLYCERIN (NITROSTAT) 0.4 MG SL tablet Place 0.4 mg under the tongue every 5 (five) minutes as needed for chest pain. x3 doses as needed for chest pain    [provider]  omeprazole (PRILOSEC) 40 MG capsule TAKE 1 CAPSULE DAILY BEFORE BREAKFAST BY MOUTH. Patient taking differently: Take 40 mg by mouth daily.  05/04/18   Plotnikov, Evie Lacks, MD  ondansetron (ZOFRAN) 8 MG tablet Take 1 tablet (8 mg total) by mouth 2 (two) times daily as needed for refractory nausea / vomiting. chemotherapy. 06/23/18   Brunetta Genera, MD  predniSONE (DELTASONE) 20 MG tablet Take 3 tablets (60 mg total) by mouth daily. Take on days 1-5 of chemotherapy. 08/04/18   Brunetta Genera, MD  PROAIR HFA 108 [id number] Base) MCG/ACT inhaler Inhale 1 puff into the lungs every 4 (four) hours as needed. 04/21/18   [provider]  Probiotic Product (PROBIOTIC PO) Take  1 capsule by mouth daily with breakfast.    [provider]  prochlorperazine (COMPAZINE) 10 MG tablet Take 1 tablet (10 mg total) by mouth every 6 (six)  hours as needed (Nausea or vomiting). 06/23/18   Brunetta Genera, MD  testosterone enanthate (DELATESTRYL) 200 MG/ML injection INJECT 1ML (200MG  TOTAL) INTO THE MUSCLE EVERY 14 DAYS. FOR INTRAMUSCULAR USE ONLY. DISCARD VIAL AFTER 28 DAYS. 08/03/18   Plotnikov, Evie Lacks, MD  tiZANidine (ZANAFLEX) 4 MG tablet TAKE 1 TABLET BY MOUTH  EVERY 6 HOURS AS NEEDED FOR MUSCLE SPASM(S) 08/31/18   Plotnikov, Evie Lacks, MD  triamcinolone ointment (KENALOG) 0.5 % APPLY TO AFFECTED AREA TWICE A DAY. 08/01/18   Brunetta Genera, MD  vitamin C (ASCORBIC ACID) 500 MG tablet Take 500 mg by mouth daily.    [provider]  XARELTO 10 MG TABS tablet TAKE 1 TABLET DAILY BY  MOUTH. 03/20/18   Plotnikov, Evie Lacks, MD    Family History Family History  Problem Relation Age of Onset  . CAD Father 59  . Asthma Father   . Alcohol abuse Father   . Arthritis Mother   . Alcohol abuse Sister     Social History Social History   Tobacco Use  . Smoking status: Former Smoker    Last attempt to quit: 06/05/2012    Years since quitting: 6.4  . Smokeless tobacco: Never Used  Substance Use Topics  . Alcohol use: Yes    Comment: occasional-social  . Drug use: No     Allergies   Short ragweed pollen ext   Review of Systems Review of Systems  Neurological: Positive for weakness.  ROS: Statement: All systems negative except as marked or noted in the HPI; Constitutional: Negative for fever and chills. +generalized weakness.; ; Eyes: Negative for eye pain, redness and discharge. ; ; ENMT: Negative for ear pain, hoarseness, nasal congestion, sinus pressure and sore throat. ; ; Cardiovascular: Negative for chest pain, palpitations, diaphoresis, dyspnea and peripheral edema. ; ; Respiratory: Negative for cough, wheezing and stridor. ; ; Gastrointestinal: Negative for nausea, vomiting, diarrhea, abdominal pain, blood in stool, hematemesis, jaundice and rectal bleeding. . ; ; Genitourinary: +"foul smelling  urine." Negative for dysuria, flank pain and hematuria. ; ; Genital:  No penile drainage or rash, no testicular pain or swelling, no scrotal rash or swelling. ;; Musculoskeletal: Negative for back pain and neck pain. Negative for swelling and trauma.; ; Skin: Negative for pruritus, rash, abrasions, blisters, bruising and skin lesion.; ; Neuro: Negative for headache, lightheadedness and neck stiffness. Negative for altered level of consciousness, altered mental status, extremity weakness, paresthesias, involuntary movement, seizure and syncope.       Physical Exam Updated Vital Signs BP (!) 143/63 (BP Location: Left Arm)   Pulse 79   Temp 98.3 F (36.8 C) (Oral)   Resp 20   Ht 6\' 2"  (1.88 m)   Wt 136.1 kg   SpO2 96%   BMI 38.52 kg/m    Patient Vitals for the past 24 hrs:  BP Temp Temp src Pulse Resp SpO2 Height Weight  11/08/18 2330 (!) 158/98 98.6 F (37 C) Oral 91 20 96 % - -  11/08/18 2141 131/80 - - 79 (!) 22 97 % - -  11/08/18 2042 - - - - - - 6\' 2"  (1.88 m) 136.1 kg  11/08/18 2039 (!) 143/63 98.3 F (36.8 C) Oral 79 20 96 % - -    23:52:25 Orthostatic Vital Signs JF  Orthostatic Lying   BP- Lying: 156/77  Pulse- Lying: 98      Orthostatic Sitting  BP- Sitting: 168/72  Pulse- Sitting: 95      Orthostatic Standing at 0 minutes  BP- Standing at 0 minutes: 152/73  Pulse- Standing at 0 minutes: 97      Orthostatic Standing at 3 minutes  BP- Standing at 3 minutes: 155/107Abnormal   Pulse- Standing at 3 minutes: 121     Physical Exam 2115: Physical examination:  Nursing notes reviewed; Vital signs and O2 SAT reviewed;  Constitutional: Well developed, Well nourished, Well hydrated, In no acute distress; Head:  Normocephalic, atraumatic; Eyes: EOMI, PERRL, No scleral icterus; ENMT: Mouth and pharynx normal, Mucous membranes moist; Neck: Supple, Full range of motion, No lymphadenopathy; Cardiovascular: Irregular rate and rhythm, No gallop; Respiratory: Breath sounds  clear & equal bilaterally, No wheezes.  Speaking full sentences with ease, Normal respiratory effort/excursion; Chest: Nontender, Movement normal; Abdomen: Soft, Nontender, Nondistended, Normal bowel sounds; Genitourinary: No CVA tenderness; Spine:  No midline CS, TS, LS tenderness.;; Extremities: Peripheral pulses normal, No tenderness, No edema, No calf edema or asymmetry.; Neuro: AA&Ox3, Major CN grossly intact. No facial droop.  Speech clear. Grips equal. Strength 5/5 equal bilat UE's and LE's. No gross focal motor or sensory deficits in extremities.; Skin: Color normal, Warm, Dry.    ED Treatments / Results  Labs (all labs ordered are listed, but only abnormal results are displayed)   EKG EKG Interpretation  Date/Time:  Wednesday November 08 2018 20:58:23 EST Ventricular Rate:  83 PR Interval:    QRS Duration: 116 QT Interval:  421 QTC Calculation: 498 R Axis:   -45 Text Interpretation:  Atrial fibrillation Ventricular premature complex LAD, consider left anterior fascicular block Low voltage, precordial leads When compared with ECG of 12/11/2016 No significant change was found Confirmed by Francine Graven [phone]) on 11/08/2018 9:30:12 PM   Radiology   Procedures Procedures (including critical care time)  Medications Ordered in ED Medications  sodium chloride flush (NS) 0.9 % injection 3 mL (has no administration in time range)     Initial Impression / Assessment and Plan / ED Course  I have reviewed the triage vital signs and the nursing notes.  Pertinent labs & imaging results that were available during my care of the patient were reviewed by me and considered in my medical decision making (see chart for details).  MDM Reviewed: previous chart, nursing note and vitals Reviewed previous: ECG and labs Interpretation: labs, ECG, x-ray and CT scan    Results for orders placed or performed during the hospital encounter of 58/30/94  Basic metabolic panel  Result Value  Ref Range   Sodium 136 135 - 145 mmol/L   Potassium 4.9 3.5 - 5.1 mmol/L   Chloride 101 98 - 111 mmol/L   CO2 27 22 - 32 mmol/L   Glucose, Bld 100 (H) 70 - 99 mg/dL   BUN 24 (H) 8 - 23 mg/dL   Creatinine, Ser 1.25 (H) 0.61 - 1.24 mg/dL   Calcium 9.3 8.9 - 10.3 mg/dL   GFR calc non Af Amer 57 (L) >60 mL/min   GFR calc Af Amer >60 >60 mL/min   Anion gap 8 5 - 15  CBC  Result Value Ref Range   WBC 6.7 4.0 - 10.5 K/uL   RBC 3.43 (L) 4.22 - 5.81 MIL/uL   Hemoglobin 11.4 (L) 13.0 - 17.0 g/dL   HCT 36.1 (L) 39.0 - 52.0 %  MCV 105.2 (H) 80.0 - 100.0 fL   MCH 33.2 26.0 - 34.0 pg   MCHC 31.6 30.0 - 36.0 g/dL   RDW 15.1 11.5 - 15.5 %   Platelets 173 150 - 400 K/uL   nRBC 0.0 0.0 - 0.2 %  Urinalysis, Routine w reflex microscopic  Result Value Ref Range   Color, Urine YELLOW YELLOW   APPearance CLOUDY (A) CLEAR   Specific Gravity, Urine 1.016 1.005 - 1.030   pH 6.0 5.0 - 8.0   Glucose, UA NEGATIVE NEGATIVE mg/dL   Hgb urine dipstick LARGE (A) NEGATIVE   Bilirubin Urine NEGATIVE NEGATIVE   Ketones, ur NEGATIVE NEGATIVE mg/dL   Protein, ur 100 (A) NEGATIVE mg/dL   Nitrite NEGATIVE NEGATIVE   Leukocytes, UA LARGE (A) NEGATIVE   RBC / HPF >50 (H) 0 - 5 RBC/hpf   WBC, UA >50 (H) 0 - 5 WBC/hpf   Bacteria, UA RARE (A) NONE SEEN   Squamous Epithelial / LPF 0-5 0 - 5   Mucus PRESENT    Hyaline Casts, UA PRESENT    Ca Oxalate Crys, UA PRESENT   Differential  Result Value Ref Range   Neutrophils Relative % 71 %   Neutro Abs 4.7 1.7 - 7.7 K/uL   Lymphocytes Relative 13 %   Lymphs Abs 0.9 0.7 - 4.0 K/uL   Monocytes Relative 10 %   Monocytes Absolute 0.7 0.1 - 1.0 K/uL   Eosinophils Relative 4 %   Eosinophils Absolute 0.3 0.0 - 0.5 K/uL   Basophils Relative 1 %   Basophils Absolute 0.0 0.0 - 0.1 K/uL   Immature Granulocytes 1 %   Abs Immature Granulocytes 0.06 0.00 - 0.07 K/uL  Troponin I - Once  Result Value Ref Range   Troponin I 0.04 (HH) <0.03 ng/mL  Lactic acid, plasma    Result Value Ref Range   Lactic Acid, Venous 1.4 0.5 - 1.9 mmol/L  CBG monitoring, ED  Result Value Ref Range   Glucose-Capillary 89 70 - 99 mg/dL    Dg Chest 2 View Result Date: 11/08/2018 CLINICAL DATA:  Weakness. EXAM: CHEST - 2 VIEW COMPARISON:  Radiographs of December 11, 2016. FINDINGS: The heart size and mediastinal contours are within normal limits. Both lungs are clear. Interval placement of right internal jugular Port-A-Cath with distal tip in expected position of the SVC. The visualized skeletal structures are unremarkable. IMPRESSION: No active cardiopulmonary disease. Electronically Signed   By: Marijo Conception, M.D.   On: 11/08/2018 21:38   Ct Head Wo Contrast Result Date: 11/08/2018 CLINICAL DATA:  Worsening generalized weakness. On treatment for lymphoma. EXAM: CT HEAD WITHOUT CONTRAST TECHNIQUE: Contiguous axial images were obtained from the base of the skull through the vertex without intravenous contrast. COMPARISON:  PET-CT August 22, 2018 FINDINGS: BRAIN: No intraparenchymal hemorrhage, mass effect nor midline shift. No parenchymal brain volume loss for age. No hydrocephalus. Patchy supratentorial white matter hypodensities within normal range for patient's age, though non-specific are most compatible with chronic small vessel ischemic disease. No acute large vascular territory infarcts. No abnormal extra-axial fluid collections. Basal cisterns are patent. VASCULAR: Mild calcific atherosclerosis of the carotid siphons. SKULL: No skull fracture. 1 cm probable bone island occipital calvarium. No significant scalp soft tissue swelling. SINUSES/ORBITS: Trace paranasal sinus mucosal thickening. Mastoid air cells are well aerated.The included ocular globes and orbital contents are non-suspicious. Status post bilateral ocular lens implants. OTHER: None. IMPRESSION: Normal CT HEAD for age. Electronically Signed   By: Sandie Ano  Bloomer M.D.   On: 11/08/2018 22:34   Results for KAMAURY, CUTBIRTH (MRN [id number]) as of 11/08/2018 23:55  Ref. Range 10/02/2018 11:14 10/12/2018 09:09 11/08/2018 20:43  BUN Latest Ref Range: 8 - 23 mg/dL 22 24 (H) 24 (H)  Creatinine Latest Ref Range: 0.61 - 1.24 mg/dL 1.11 1.10 1.25 (H)    2355:  Troponin with mild elevation, no hx of same, but EKG unchanged from previous and pt denies CP. +UTI, UC pending; IV rocephin given while in the ED as previous UC from 10/03/2018 with multi-drug resistant Ecoli and Proteus: Pt and family states he was NOT tx with abx for that UTI in 09/2018.  Pt is unable to stand for orthostatic VS (nearly fell down per ED staff) due to generalized weakness. Pt BP however did not drop. Judicious IVF given for mildly elevated BUN/Cr. Pt lives at home; feel pt unsafe to return home at this time.  T/C returned from [facility], case discussed, including:  HPI, pertinent PM/SHx, VS/PE, dx testing, ED course and treatment:  Agreeable to admit.     Final Clinical Impressions(s) / ED Diagnoses   Final diagnoses:  None    ED Discharge Orders    None       Francine Graven, DO 11/10/18 2050

## 2018-11-08 NOTE — ED Notes (Signed)
Ginger ale given  Will walk after po challenge

## 2018-11-09 ENCOUNTER — Encounter (HOSPITAL_COMMUNITY): Payer: Self-pay | Admitting: Internal Medicine

## 2018-11-09 ENCOUNTER — Encounter: Payer: Medicare Other | Admitting: Physical Therapy

## 2018-11-09 DIAGNOSIS — R319 Hematuria, unspecified: Secondary | ICD-10-CM | POA: Diagnosis not present

## 2018-11-09 DIAGNOSIS — I480 Paroxysmal atrial fibrillation: Secondary | ICD-10-CM

## 2018-11-09 DIAGNOSIS — R531 Weakness: Secondary | ICD-10-CM | POA: Diagnosis not present

## 2018-11-09 DIAGNOSIS — N390 Urinary tract infection, site not specified: Secondary | ICD-10-CM

## 2018-11-09 LAB — TROPONIN I
Troponin I: 0.04 ng/mL (ref <0.03)
Troponin I: 0.04 ng/mL (ref <0.03)
Troponin I: 0.03 ng/mL (ref <0.03)

## 2018-11-09 LAB — BASIC METABOLIC PANEL
Anion gap: 8 (ref 5–15)
BUN: 23 mg/dL (ref 8–23)
CO2: 26 mmol/L (ref 22–32)
Calcium: 8.7 mg/dL — ABNORMAL LOW (ref 8.9–10.3)
Chloride: 102 mmol/L (ref 98–111)
Creatinine, Ser: 1.04 mg/dL (ref 0.61–1.24)
GFR calc Af Amer: >60 mL/min (ref >60)
GFR calc non Af Amer: >60 mL/min (ref >60)
Glucose, Bld: 102 mg/dL — ABNORMAL HIGH (ref 70–99)
Potassium: 3.5 mmol/L (ref 3.5–5.1)
Sodium: 136 mmol/L (ref 135–145)

## 2018-11-09 LAB — CBC
HEMATOCRIT: 34.6 % — AB (ref 39.0–52.0)
Hemoglobin: 10.9 g/dL — ABNORMAL LOW (ref 13.0–17.0)
MCH: 33.1 pg (ref 26.0–34.0)
MCHC: 31.5 g/dL (ref 30.0–36.0)
MCV: 105.2 fL — ABNORMAL HIGH (ref 80.0–100.0)
Platelets: 166 10*3/uL (ref 150–400)
RBC: 3.29 MIL/uL — ABNORMAL LOW (ref 4.22–5.81)
RDW: 14.9 % (ref 11.5–15.5)
WBC: 6.4 10*3/uL (ref 4.0–10.5)
nRBC: 0 % (ref 0.0–0.2)

## 2018-11-09 LAB — LACTIC ACID, PLASMA: Lactic Acid, Venous: 1 mmol/L (ref 0.5–1.9)

## 2018-11-09 LAB — CK: Total CK: 64 U/L (ref 49–397)

## 2018-11-09 LAB — TSH: TSH: 0.13 u[IU]/mL — ABNORMAL LOW (ref 0.350–4.500)

## 2018-11-09 MED ORDER — TIZANIDINE HCL 4 MG PO TABS
4.0000 mg | ORAL_TABLET | Freq: Four times a day (QID) | ORAL | Status: DC | PRN
  Administered 2018-11-09 – 2018-11-11 (×6): 4 mg via ORAL
  Filled 2018-11-09 (×6): qty 1

## 2018-11-09 MED ORDER — ALFUZOSIN HCL ER 10 MG PO TB24
10.0000 mg | ORAL_TABLET | Freq: Every day | ORAL | Status: DC
  Administered 2018-11-09 – 2018-11-10 (×2): 10 mg via ORAL
  Filled 2018-11-09 (×2): qty 1

## 2018-11-09 MED ORDER — RIVAROXABAN 10 MG PO TABS
10.0000 mg | ORAL_TABLET | Freq: Every day | ORAL | Status: DC
  Administered 2018-11-09 – 2018-11-11 (×3): 10 mg via ORAL
  Filled 2018-11-09 (×3): qty 1

## 2018-11-09 MED ORDER — DULOXETINE HCL 30 MG PO CPEP
60.0000 mg | ORAL_CAPSULE | Freq: Two times a day (BID) | ORAL | Status: DC
  Administered 2018-11-09 – 2018-11-11 (×5): 60 mg via ORAL
  Filled 2018-11-09 (×5): qty 2

## 2018-11-09 MED ORDER — DILTIAZEM HCL ER COATED BEADS 240 MG PO CP24
240.0000 mg | ORAL_CAPSULE | Freq: Every day | ORAL | Status: DC
  Administered 2018-11-09 – 2018-11-11 (×3): 240 mg via ORAL
  Filled 2018-11-09 (×3): qty 1

## 2018-11-09 MED ORDER — ONDANSETRON HCL 4 MG PO TABS: 4.0000 mg | ORAL_TABLET | Freq: Four times a day (QID) | ORAL | Status: DC | PRN

## 2018-11-09 MED ORDER — ACETAMINOPHEN 325 MG PO TABS: 650.0000 mg | ORAL_TABLET | Freq: Four times a day (QID) | ORAL | Status: DC | PRN

## 2018-11-09 MED ORDER — ACETAMINOPHEN 650 MG RE SUPP: 650.0000 mg | Freq: Four times a day (QID) | RECTAL | Status: DC | PRN

## 2018-11-09 MED ORDER — ATORVASTATIN CALCIUM 40 MG PO TABS
80.0000 mg | ORAL_TABLET | Freq: Every day | ORAL | Status: DC
  Administered 2018-11-09 – 2018-11-11 (×3): 80 mg via ORAL
  Filled 2018-11-09 (×2): qty 2
  Filled 2018-11-09: qty 1

## 2018-11-09 MED ORDER — BUPROPION HCL ER (XL) 300 MG PO TB24
300.0000 mg | ORAL_TABLET | Freq: Every day | ORAL | Status: DC
  Administered 2018-11-09 – 2018-11-11 (×3): 300 mg via ORAL
  Filled 2018-11-09 (×2): qty 1
  Filled 2018-11-09: qty 2

## 2018-11-09 MED ORDER — NITROGLYCERIN 0.4 MG SL SUBL: 0.4000 mg | SUBLINGUAL_TABLET | SUBLINGUAL | Status: DC | PRN

## 2018-11-09 MED ORDER — DOXYCYCLINE HYCLATE 100 MG PO TABS
100.0000 mg | ORAL_TABLET | Freq: Every day | ORAL | Status: DC
  Administered 2018-11-09 – 2018-11-11 (×3): 100 mg via ORAL
  Filled 2018-11-09 (×3): qty 1

## 2018-11-09 MED ORDER — LEVOTHYROXINE SODIUM 75 MCG PO TABS
225.0000 ug | ORAL_TABLET | Freq: Every day | ORAL | Status: DC
  Administered 2018-11-09 – 2018-11-11 (×3): 225 ug via ORAL
  Filled 2018-11-09 (×3): qty 1

## 2018-11-09 MED ORDER — FERROUS SULFATE 325 (65 FE) MG PO TABS
325.0000 mg | ORAL_TABLET | Freq: Two times a day (BID) | ORAL | Status: DC
  Administered 2018-11-09 – 2018-11-11 (×4): 325 mg via ORAL
  Filled 2018-11-09 (×5): qty 1

## 2018-11-09 MED ORDER — ALBUTEROL SULFATE HFA 108 (90 BASE) MCG/ACT IN AERS: 1.0000 | INHALATION_SPRAY | RESPIRATORY_TRACT | Status: DC | PRN

## 2018-11-09 MED ORDER — MAGNESIUM OXIDE 400 (241.3 MG) MG PO TABS
400.0000 mg | ORAL_TABLET | Freq: Every day | ORAL | Status: DC
  Administered 2018-11-09 – 2018-11-11 (×3): 400 mg via ORAL
  Filled 2018-11-09 (×3): qty 1

## 2018-11-09 MED ORDER — HYDROCODONE-ACETAMINOPHEN 7.5-325 MG PO TABS
1.0000 | ORAL_TABLET | Freq: Four times a day (QID) | ORAL | Status: DC | PRN
  Administered 2018-11-09 – 2018-11-11 (×6): 1 via ORAL
  Filled 2018-11-09 (×6): qty 1

## 2018-11-09 MED ORDER — MIRABEGRON ER 25 MG PO TB24
50.0000 mg | ORAL_TABLET | Freq: Every day | ORAL | Status: DC
  Administered 2018-11-09 – 2018-11-10 (×2): 50 mg via ORAL
  Filled 2018-11-09 (×2): qty 2

## 2018-11-09 MED ORDER — EZETIMIBE 10 MG PO TABS
10.0000 mg | ORAL_TABLET | Freq: Every day | ORAL | Status: DC
  Administered 2018-11-09 – 2018-11-11 (×3): 10 mg via ORAL
  Filled 2018-11-09 (×3): qty 1

## 2018-11-09 MED ORDER — METOPROLOL TARTRATE 25 MG PO TABS
25.0000 mg | ORAL_TABLET | Freq: Two times a day (BID) | ORAL | Status: DC
  Administered 2018-11-09 – 2018-11-11 (×6): 25 mg via ORAL
  Filled 2018-11-09 (×6): qty 1

## 2018-11-09 MED ORDER — VITAMIN C 500 MG PO TABS
500.0000 mg | ORAL_TABLET | Freq: Every day | ORAL | Status: DC
  Administered 2018-11-09 – 2018-11-11 (×3): 500 mg via ORAL
  Filled 2018-11-09 (×3): qty 1

## 2018-11-09 MED ORDER — PANTOPRAZOLE SODIUM 40 MG PO TBEC
40.0000 mg | DELAYED_RELEASE_TABLET | Freq: Every day | ORAL | Status: DC
  Administered 2018-11-09 – 2018-11-11 (×3): 40 mg via ORAL
  Filled 2018-11-09 (×3): qty 1

## 2018-11-09 MED ORDER — ALBUTEROL SULFATE (2.5 MG/3ML) 0.083% IN NEBU: 2.5000 mg | INHALATION_SOLUTION | RESPIRATORY_TRACT | Status: DC | PRN

## 2018-11-09 MED ORDER — CLONAZEPAM 1 MG PO TABS
1.0000 mg | ORAL_TABLET | Freq: Four times a day (QID) | ORAL | Status: DC | PRN
  Administered 2018-11-09 – 2018-11-11 (×7): 1 mg via ORAL
  Filled 2018-11-09: qty 1
  Filled 2018-11-09: qty 2
  Filled 2018-11-09 (×5): qty 1

## 2018-11-09 MED ORDER — LORATADINE 10 MG PO TABS
10.0000 mg | ORAL_TABLET | Freq: Every day | ORAL | Status: DC
  Administered 2018-11-09 – 2018-11-11 (×3): 10 mg via ORAL
  Filled 2018-11-09 (×3): qty 1

## 2018-11-09 MED ORDER — ADULT MULTIVITAMIN W/MINERALS CH
1.0000 | ORAL_TABLET | Freq: Every day | ORAL | Status: DC
  Administered 2018-11-09 – 2018-11-11 (×3): 1 via ORAL
  Filled 2018-11-09 (×3): qty 1

## 2018-11-09 MED ORDER — ONDANSETRON HCL 4 MG/2ML IJ SOLN: 4.0000 mg | Freq: Four times a day (QID) | INTRAMUSCULAR | Status: DC | PRN

## 2018-11-09 NOTE — NC FL2 (Signed)
[place] LEVEL OF CARE SCREENING TOOL     IDENTIFICATION  Patient Name: [name] Birthdate: Jun 19, 1946 Sex: male Admission Date (Current Location): 11/08/2018  [facility] and [place] Number:  Herbalist and Address:  [facility],  [place] [address], [place]      Provider Number: [id number]  Attending Physician Name and Address:  Albertine Patricia, MD  Relative Name and Phone Number:  Rush Farmer [phone]    Current Level of Care: Hospital Recommended Level of Care: [place] Prior Approval Number:    Date Approved/Denied:   PASRR Number:  [id number]   Discharge Plan: SNF    Current Diagnoses: Patient Active Problem List   Diagnosis Date Noted  . Generalized weakness 11/08/2018  . Well adult exam 10/02/2018  . Counseling regarding advance care planning and goals of care 07/09/2018  . Diffuse large B-cell lymphoma ([place]) 06/23/2018  . Port-A-Cath in place 06/23/2018  . Hypogonadism in male 11/22/2017  . Nephrolithiasis 10/24/2017  . Recurrent dislocation of left patella 07/15/2017  . Recurrent subluxation of patella, left 07/08/2017  . S/P revision of total knee 04/11/2017  . Failed total knee, left, initial encounter ([place]) 04/09/2017  . Renal calculus, right 02/08/2017  . Protein-calorie malnutrition, severe ([place]) 12/08/2016  . Chronic obstructive pulmonary disease ([place]) 12/08/2016  . Staghorn renal calculus 11/05/2016  . Wound drainage right hip 11/15/2013  . Expected blood loss anemia 09/18/2013  . Hyponatremia 09/18/2013  . S/P right TH revision 09/17/2013  . Nonunion, fracture, Right tibia  07/03/2013  . UTI (urinary tract infection) 07/03/2013  . Pathological fracture due to osteoporosis with delayed healing 04/24/2013  . Osteoporosis with fracture 04/24/2013  . Rash and nonspecific skin eruption 03/08/2013  . Syncope 02/21/2013  . PAF (paroxysmal atrial fibrillation) ([place])  02/21/2013  . Chronic anticoagulation 02/21/2013  . Fall 02/08/2013  . Physical deconditioning 02/08/2013  . Low back pain radiating to both legs 02/08/2013  . Compression fracture of L1 lumbar vertebra (HCC) 02/08/2013  . Right tibial fracture 02/05/2013  . Obesity, unspecified 02/05/2013  . Osteomyelitis of right wrist ([place]) 11/02/2012  . Anemia 10/06/2012  . HTN (hypertension) 10/03/2012  . Psoriasis 10/03/2012  . Dyslipidemia 10/03/2012  . Vitamin D deficiency 10/03/2012  . GERD (gastroesophageal reflux disease) 10/03/2012  . Hyperglycemia 10/03/2012  . Anxiety 10/03/2012  . Depression 10/03/2012  . CAD (coronary artery disease) 10/03/2012  . DJD (degenerative joint disease) 10/03/2012  . Chronic gingivitis 10/03/2012  . Low testosterone, possible hypogonadism 10/01/2012  . Normocytic anemia 09/30/2012  . Right femoral fracture ([place]) 09/29/2012  . Right forearm fracture 09/29/2012  . Atrial fibrillation ([place]) 09/29/2012  . Hypothyroidism 09/29/2012  . History of recurrent UTIs 09/29/2012    Orientation RESPIRATION BLADDER Height & Weight     Self, Time, Situation, Place  Normal Continent Weight: 300 lb (136.1 kg) Height:  6\' 2"  (188 cm)  BEHAVIORAL SYMPTOMS/MOOD NEUROLOGICAL BOWEL NUTRITION STATUS      Continent Diet(Heart Healthy)  AMBULATORY STATUS COMMUNICATION OF NEEDS Skin   Limited Assist Verbally Normal                       Personal Care Assistance Level of Assistance  Bathing, Feeding, Dressing Bathing Assistance: Limited assistance Feeding assistance: Independent Dressing Assistance: Limited assistance     Functional Limitations Info             SPECIAL CARE FACTORS FREQUENCY  PT (By licensed  PT), OT (By licensed OT)     PT Frequency: 5 OT Frequency: 5            Contractures      Additional Factors Info  Code Status Code Status Info: Full             Current Medications (11/09/2018):  This is the current hospital active  medication list Current Facility-Administered Medications  Medication Dose Route Frequency Provider Last Rate Last Dose  . acetaminophen (TYLENOL) tablet 650 mg  650 mg Oral Q6H PRN Rise Patience, MD       Or  . acetaminophen (TYLENOL) suppository 650 mg  650 mg Rectal Q6H PRN Rise Patience, MD      . albuterol (PROVENTIL) (2.5 MG/3ML) 0.083% nebulizer solution 2.5 mg  2.5 mg Nebulization Q4H PRN Rise Patience, MD      . alfuzosin (UROXATRAL) 24 hr tablet 10 mg  10 mg Oral QHS Rise Patience, MD      . atorvastatin (LIPITOR) tablet 80 mg  80 mg Oral Daily Rise Patience, MD   80 mg at 11/09/18 1046  . buPROPion (WELLBUTRIN XL) 24 hr tablet 300 mg  300 mg Oral Daily Rise Patience, MD   300 mg at 11/09/18 1046  . clonazePAM (KLONOPIN) tablet 1 mg  1 mg Oral Q6H PRN Rise Patience, MD   1 mg at 11/09/18 0422  . diltiazem (CARDIZEM CD) 24 hr capsule 240 mg  240 mg Oral Daily Rise Patience, MD   240 mg at 11/09/18 1046  . doxycycline (VIBRA-TABS) tablet 100 mg  100 mg Oral Daily Rise Patience, MD   100 mg at 11/09/18 1059  . DULoxetine (CYMBALTA) DR capsule 60 mg  60 mg Oral BID Rise Patience, MD   60 mg at 11/09/18 1044  . ezetimibe (ZETIA) tablet 10 mg  10 mg Oral Daily Rise Patience, MD   10 mg at 11/09/18 1046  . ferrous sulfate tablet 325 mg  325 mg Oral BID WC Rise Patience, MD   325 mg at 11/09/18 1478  . HYDROcodone-acetaminophen (NORCO) 7.5-325 MG per tablet 1 tablet  1 tablet Oral QID PRN Rise Patience, MD      . levothyroxine (SYNTHROID, LEVOTHROID) tablet 225 mcg  225 mcg Oral Q0600 Rise Patience, MD   225 mcg at 11/09/18 2956  . loratadine (CLARITIN) tablet 10 mg  10 mg Oral Daily Rise Patience, MD   10 mg at 11/09/18 1059  . magnesium oxide (MAG-OX) tablet 400 mg  400 mg Oral Daily Rise Patience, MD   400 mg at 11/09/18 1059  . metoprolol tartrate (LOPRESSOR) tablet 25 mg  25 mg  Oral BID Rise Patience, MD   25 mg at 11/09/18 1046  . mirabegron ER (MYRBETRIQ) tablet 50 mg  50 mg Oral QHS Rise Patience, MD      . multivitamin with minerals tablet 1 tablet  1 tablet Oral Daily Rise Patience, MD   1 tablet at 11/09/18 1045  . nitroGLYCERIN (NITROSTAT) SL tablet 0.4 mg  0.4 mg Sublingual Q5 min PRN Rise Patience, MD      . ondansetron [facility]) tablet 4 mg  4 mg Oral Q6H PRN Rise Patience, MD       Or  . ondansetron [facility]) injection 4 mg  4 mg Intravenous Q6H PRN Rise Patience, MD      .  pantoprazole (PROTONIX) EC tablet 40 mg  40 mg Oral Daily Rise Patience, MD   40 mg at 11/09/18 1045  . rivaroxaban (XARELTO) tablet 10 mg  10 mg Oral Daily Rise Patience, MD   10 mg at 11/09/18 1046  . sodium chloride flush (NS) 0.9 % injection 3 mL  3 mL Intravenous Once Rise Patience, MD      . tiZANidine (ZANAFLEX) tablet 4 mg  4 mg Oral Q6H PRN Rise Patience, MD      . vitamin C (ASCORBIC ACID) tablet 500 mg  500 mg Oral Daily Rise Patience, MD   500 mg at 11/09/18 1046   Current Outpatient Medications  Medication Sig Dispense Refill  . acetaminophen (TYLENOL) 500 MG tablet Take 1,000 mg by mouth daily as needed for moderate pain.    [name] alfuzosin (UROXATRAL) 10 MG 24 hr tablet Take 10 mg by mouth at bedtime.    [name] atorvastatin (LIPITOR) 80 MG tablet Take 80 mg by mouth daily.    [name] buPROPion (WELLBUTRIN XL) 300 MG 24 hr tablet TAKE 1 TABLET DAILY WITH  BREAKFAST BY MOUTH. (Patient taking differently: Take 300 mg by mouth daily. ) 90 tablet 3  . Calcium Citrate-Vitamin D (CALCIUM CITRATE + D PO) Take 1 tablet by mouth 2 (two) times daily.     . Cholecalciferol (VITAMIN D-3 PO) Take 2,000 Units by mouth 2 (two) times daily.     . clonazePAM (KLONOPIN) 1 MG tablet Take 1 tablet (1 mg total) by mouth every 6 (six) hours as needed for anxiety. TAKE 1 TABLET BY MOUTH  EVERY 6 HOURS prn 360 tablet 1  . denosumab  (PROLIA) 60 MG/ML SOLN injection Inject 60 mg into the skin every 6 (six) months. Administer in upper arm, thigh, or abdomen    . diltiazem (CARDIZEM CD) 240 MG 24 hr capsule TAKE 1 CAPSULE BY MOUTH  DAILY 90 capsule 3  . doxycycline (VIBRA-TABS) 100 MG tablet TAKE 1 TABLET BY MOUTH  DAILY 90 tablet 1  . DULoxetine (CYMBALTA) 60 MG capsule TAKE 1 CAPSULE BY MOUTH  TWICE DAILY 180 capsule 1  . ezetimibe (ZETIA) 10 MG tablet Take 1 tablet (10 mg total) by mouth daily. 90 tablet 3  . ferrous sulfate 325 (65 FE) MG tablet Take 325 mg by mouth 2 (two) times daily with a meal.    . HYDROcodone-acetaminophen (NORCO) 7.5-325 MG tablet Take 1 tablet by mouth 4 (four) times daily as needed for moderate pain. Please fill on 11/02/18 120 tablet 0  . levothyroxine (SYNTHROID, LEVOTHROID) 200 MCG tablet TAKE 1 TABLET DAILY AT 6 AM BY MOUTH. (Patient taking differently: Take 225 mcg by mouth daily before breakfast. ) 90 tablet 3  . levothyroxine (SYNTHROID, LEVOTHROID) 25 MCG tablet TAKE 1 TABLET BY MOUTH  DAILY BEFORE BREAKFAST. (Patient taking differently: Take 225 mcg by mouth daily before breakfast. ) 90 tablet 3  . loratadine (CLARITIN) 10 MG tablet Take 10 mg by mouth daily.     . Magnesium 500 MG CAPS Take 1 capsule (500 mg total) by mouth daily. 30 capsule 1  . metoprolol tartrate (LOPRESSOR) 25 MG tablet Take 1 tablet (25 mg total) by mouth 2 (two) times daily. 180 tablet 1  . mirabegron ER (MYRBETRIQ) 50 MG TB24 tablet Take 50 mg by mouth at bedtime.     . Multiple Vitamin (MULTIVITAMIN WITH MINERALS) TABS tablet Take 1 tablet by mouth daily. Men's One-A-Day 82+    .  omeprazole (PRILOSEC) 40 MG capsule TAKE 1 CAPSULE DAILY BEFORE BREAKFAST BY MOUTH. (Patient taking differently: Take 40 mg by mouth daily. ) 90 capsule 3  . PROAIR HFA 108 (90 Base) MCG/ACT inhaler Inhale 1 puff into the lungs every 4 (four) hours as needed.    . Probiotic Product (PROBIOTIC PO) Take 1 capsule by mouth daily with breakfast.     . testosterone enanthate (DELATESTRYL) 200 MG/ML injection INJECT 1ML (200MG  TOTAL) INTO THE MUSCLE EVERY 14 DAYS. FOR INTRAMUSCULAR USE ONLY. DISCARD VIAL AFTER 28 DAYS. (Patient taking differently: Inject 100 mg into the muscle every 14 (fourteen) days. ) 5 mL 4  . tiZANidine (ZANAFLEX) 4 MG tablet TAKE 1 TABLET BY MOUTH  EVERY 6 HOURS AS NEEDED FOR MUSCLE SPASM(S) (Patient taking differently: Take 4 mg by mouth every 6 (six) hours as needed for muscle spasms. ) 360 tablet 1  . vitamin C (ASCORBIC ACID) 500 MG tablet Take 500 mg by mouth daily.    Alveda Reasons 10 MG TABS tablet TAKE 1 TABLET DAILY BY  MOUTH. 90 tablet 3  . BELSOMRA 15 MG TABS Take 1 tablet by mouth daily. (Patient not taking: Reported on 11/08/2018) 90 tablet 1  . EPINEPHrine (EPIPEN 2-PAK) 0.3 mg/0.3 mL IJ SOAJ injection Inject 0.3 mg into the muscle daily as needed (for anaphylactic reaction).    [name] lidocaine-prilocaine (EMLA) cream Apply to affected area once (Patient not taking: Reported on 11/08/2018) 30 g 3  . LORazepam (ATIVAN) 0.5 MG tablet Take 1 tablet (0.5 mg total) by mouth every 6 (six) hours as needed (Nausea or vomiting). (Patient not taking: Reported on 11/08/2018) 30 tablet 0  . nitroGLYCERIN (NITROSTAT) 0.4 MG SL tablet Place 0.4 mg under the tongue every 5 (five) minutes as needed for chest pain. x3 doses as needed for chest pain    . ondansetron (ZOFRAN) 8 MG tablet Take 1 tablet (8 mg total) by mouth 2 (two) times daily as needed for refractory nausea / vomiting. chemotherapy. (Patient not taking: Reported on 11/08/2018) 30 tablet 1  . predniSONE (DELTASONE) 20 MG tablet Take 3 tablets (60 mg total) by mouth daily. Take on days 1-5 of chemotherapy. (Patient not taking: Reported on 11/08/2018) 50 tablet 0  . prochlorperazine (COMPAZINE) 10 MG tablet Take 1 tablet (10 mg total) by mouth every 6 (six) hours as needed (Nausea or vomiting). (Patient not taking: Reported on 11/08/2018) 30 tablet 6  . triamcinolone ointment  (KENALOG) 0.5 % APPLY TO AFFECTED AREA TWICE A DAY. (Patient not taking: Reported on 11/08/2018) 30 g 0     Discharge Medications: Please see discharge summary for a list of discharge medications.  Relevant Imaging Results:  Relevant Lab Results:   Additional Information SSN: [id number]   Patient is 6'2'' and 300 lbs.   Ollen Barges, LCSWA

## 2018-11-09 NOTE — Progress Notes (Signed)
PROGRESS NOTE                                                                                                                                                                                                             Patient Demographics:    [name], is a 73 y.o. male, DOB - 1945-10-19, IHW:[id number]  Admit date - 11/08/2018   Admitting Physician No admitting provider for patient encounter.  Outpatient Primary MD for the patient is Plotnikov, Evie Lacks, MD  LOS - 0   Chief Complaint  Patient presents with  . Weakness       Brief Narrative    This is a no charge note, as patient admitted earlier today, chart, imaging and labs were reviewed  HPI: [name] is a 73 y.o. male with history of lymphoma presently on chemotherapy last 1 week ago, atrial fibrillation on Xarelto, sleep apnea, anxiety depression, CAD presents to the ER with complaints of increasing weakness and difficulty ambulating   Subjective:    [name] today has, No headache, No chest pain, No abdominal pain - No Nausea, patient reports he is generally weak  Assessment  & Plan :    Principal Problem:   Generalized weakness Active Problems:   Hypothyroidism   HTN (hypertension)   CAD (coronary artery disease)   PAF (paroxysmal atrial fibrillation) ([place])   1. Generalized weakness mostly in the lower extremities which patient states used to be there previously which improved but started getting worse again after chemotherapy.  We will get physical therapy again.  Continue to monitor.  Denies any back pain no incontinence of urine at this time so no further imaging at this time is been ordered. 2. UTI on ceftriaxone.  Follow urine cultures. 3. Hypothyroidism on Synthroid.  Check TSH. 4. Paroxysmal atrial fibrillation on Cardizem metoprolol and Xarelto. 5. Hypertension on metoprolol and Cardizem. 6. Hyperlipidemia on statins.  Check CK levels. 7. Anemia  could be from chemotherapy follow CBC. 8. Sleep apnea on CPAP. 9. Lymphoma being followed by Dr. Irene Limbo. 10. Elevated troponin denies any chest pain. 11. History of BPH.    Code Status : Full  Family Communication  : None at bedside  Disposition Plan  : will need SNF   Consults  :  None  Procedures  : None  DVT Prophylaxis  :  ON XARELTO  Lab Results  Component Value Date   PLT 166 11/09/2018    Antibiotics  :    Anti-infectives (From admission, onward)   Start     Dose/Rate Route Frequency Ordered Stop   11/09/18 1000  doxycycline (VIBRA-TABS) tablet 100 mg     100 mg Oral Daily 11/09/18 0316     11/08/18 2345  cefTRIAXone (ROCEPHIN) 1 g in sodium chloride 0.9 % 100 mL IVPB     1 g 200 mL/hr over 30 Minutes Intravenous  Once 11/08/18 2338 11/09/18 0339        Objective:   Vitals:   11/09/18 0742 11/09/18 0900 11/09/18 0920 11/09/18 1000  BP: (!) 150/66 (!) 141/76 (!) 141/76 (!) 169/70  Pulse: (!) 121 83 95 73  Resp: (!) 26  18   Temp:      TempSrc:      SpO2: 95% 91% 97% 95%  Weight:      Height:        Wt Readings from Last 3 Encounters:  11/08/18 136.1 kg  10/12/18 (!) 139.1 kg  10/02/18 (!) 137 kg     Intake/Output Summary (Last 24 hours) at 11/09/2018 1305 Last data filed at 11/09/2018 1100 Gross per 24 hour  Intake -  Output 500 ml  Net -500 ml     Physical Exam  Awake Alert, Oriented X 3, No new F.N deficits, Normal affect Symmetrical Chest wall movement, Good air movement bilaterally, CTAB RRR,No Gallops,Rubs or new Murmurs, No Parasternal Heave +ve B.Sounds, Abd Soft, No tenderness,No rebound - guarding or rigidity. No Cyanosis, Clubbing or edema, No new Rash or bruise     Data Review:    CBC Recent Labs  Lab 11/08/18 2043 11/08/18 2104 11/09/18 0500  WBC 6.7  --  6.4  HGB 11.4*  --  10.9*  HCT 36.1*  --  34.6*  PLT 173  --  166  MCV 105.2*  --  105.2*  MCH 33.2  --  33.1  MCHC 31.6  --  31.5  RDW 15.1  --  14.9    LYMPHSABS  --  0.9  --   MONOABS  --  0.7  --   EOSABS  --  0.3  --   BASOSABS  --  0.0  --     Chemistries  Recent Labs  Lab 11/08/18 2043 11/09/18 0500  NA 136 136  K 4.9 3.5  CL 101 102  CO2 27 26  GLUCOSE 100* 102*  BUN 24* 23  CREATININE 1.25* 1.04  CALCIUM 9.3 8.7*   ------------------------------------------------------------------------------------------------------------------ No results for input(s): CHOL, HDL, LDLCALC, TRIG, CHOLHDL, LDLDIRECT in the last 72 hours.  Lab Results  Component Value Date   HGBA1C 5.8 (H) 02/06/2013   ------------------------------------------------------------------------------------------------------------------ Recent Labs    11/09/18 0425  TSH 0.130*   ------------------------------------------------------------------------------------------------------------------ No results for input(s): VITAMINB12, FOLATE, FERRITIN, TIBC, IRON, RETICCTPCT in the last 72 hours.  Coagulation profile No results for input(s): INR, PROTIME in the last 168 hours.  No results for input(s): DDIMER in the last 72 hours.  Cardiac Enzymes Recent Labs  Lab 11/08/18 2104 11/09/18 0424 11/09/18 0917  TROPONINI 0.04* 0.04* 0.04*   ------------------------------------------------------------------------------------------------------------------    Component Value Date/Time   BNP 94.5 12/11/2016 1532    Inpatient Medications  Scheduled Meds: . alfuzosin  10 mg Oral QHS  . atorvastatin  80 mg Oral Daily  . buPROPion  300 mg Oral Daily  . diltiazem  240 mg Oral  Daily  . doxycycline  100 mg Oral Daily  . DULoxetine  60 mg Oral BID  . ezetimibe  10 mg Oral Daily  . ferrous sulfate  325 mg Oral BID WC  . levothyroxine  225 mcg Oral Q0600  . loratadine  10 mg Oral Daily  . magnesium oxide  400 mg Oral Daily  . metoprolol tartrate  25 mg Oral BID  . mirabegron ER  50 mg Oral QHS  . multivitamin with minerals  1 tablet Oral Daily  .  pantoprazole  40 mg Oral Daily  . rivaroxaban  10 mg Oral Daily  . sodium chloride flush  3 mL Intravenous Once  . vitamin C  500 mg Oral Daily   Continuous Infusions: PRN Meds:.acetaminophen **OR** acetaminophen, albuterol, clonazePAM, HYDROcodone-acetaminophen, nitroGLYCERIN, ondansetron **OR** ondansetron (ZOFRAN) IV, tiZANidine  Micro Results No results found for this or any previous visit (from the past 240 hour(s)).  Radiology Reports Dg Chest 2 View  Result Date: 11/08/2018 CLINICAL DATA:  Weakness. EXAM: CHEST - 2 VIEW COMPARISON:  Radiographs of December 11, 2016. FINDINGS: The heart size and mediastinal contours are within normal limits. Both lungs are clear. Interval placement of right internal jugular Port-A-Cath with distal tip in expected position of the SVC. The visualized skeletal structures are unremarkable. IMPRESSION: No active cardiopulmonary disease. Electronically Signed   By: Marijo Conception, M.D.   On: 11/08/2018 21:38   Ct Head Wo Contrast  Result Date: 11/08/2018 CLINICAL DATA:  Worsening generalized weakness. On treatment for lymphoma. EXAM: CT HEAD WITHOUT CONTRAST TECHNIQUE: Contiguous axial images were obtained from the base of the skull through the vertex without intravenous contrast. COMPARISON:  PET-CT August 22, 2018 FINDINGS: BRAIN: No intraparenchymal hemorrhage, mass effect nor midline shift. No parenchymal brain volume loss for age. No hydrocephalus. Patchy supratentorial white matter hypodensities within normal range for patient's age, though non-specific are most compatible with chronic small vessel ischemic disease. No acute large vascular territory infarcts. No abnormal extra-axial fluid collections. Basal cisterns are patent. VASCULAR: Mild calcific atherosclerosis of the carotid siphons. SKULL: No skull fracture. 1 cm probable bone island occipital calvarium. No significant scalp soft tissue swelling. SINUSES/ORBITS: Trace paranasal sinus mucosal  thickening. Mastoid air cells are well aerated.The included ocular globes and orbital contents are non-suspicious. Status post bilateral ocular lens implants. OTHER: None. IMPRESSION: Normal CT HEAD for age. Electronically Signed   By: Elon Alas M.D.   On: 11/08/2018 22:34     Phillips Climes M.D on 11/09/2018 at 1:05 PM  Between 7am to 7pm - Pager - [phone]  After 7pm go to [email or web address] - password [facility]  [facility] -  Office  [phone]

## 2018-11-09 NOTE — ED Notes (Signed)
I have just given report to Andee Poles, RN on [place]. Will transport shortly. Pt. Remains in no distress.

## 2018-11-09 NOTE — NC FL2 (Deleted)
[place] LEVEL OF CARE SCREENING TOOL     IDENTIFICATION  Patient Name: [name] Birthdate: 08-Sep-1946 Sex: male Admission Date (Current Location): 11/08/2018  [facility] and [place] Number:  Herbalist and Address:  [facility],  [place] [address] [place]      Provider Number: [id number]  Attending Physician Name and Address:  Albertine Patricia, MD  Relative Name and Phone Number:  Rush Farmer [phone]    Current Level of Care: Hospital Recommended Level of Care: [place] Prior Approval Number:    Date Approved/Denied:   PASRR Number:  [id number]   Discharge Plan: SNF    Current Diagnoses: Patient Active Problem List   Diagnosis Date Noted  . Generalized weakness 11/08/2018  . Well adult exam 10/02/2018  . Counseling regarding advance care planning and goals of care 07/09/2018  . Diffuse large B-cell lymphoma ([place]) 06/23/2018  . Port-A-Cath in place 06/23/2018  . Hypogonadism in male 11/22/2017  . Nephrolithiasis 10/24/2017  . Recurrent dislocation of left patella 07/15/2017  . Recurrent subluxation of patella, left 07/08/2017  . S/P revision of total knee 04/11/2017  . Failed total knee, left, initial encounter ([place]) 04/09/2017  . Renal calculus, right 02/08/2017  . Protein-calorie malnutrition, severe ([place]) 12/08/2016  . Chronic obstructive pulmonary disease ([place]) 12/08/2016  . Staghorn renal calculus 11/05/2016  . Wound drainage right hip 11/15/2013  . Expected blood loss anemia 09/18/2013  . Hyponatremia 09/18/2013  . S/P right TH revision 09/17/2013  . Nonunion, fracture, Right tibia  07/03/2013  . UTI (urinary tract infection) 07/03/2013  . Pathological fracture due to osteoporosis with delayed healing 04/24/2013  . Osteoporosis with fracture 04/24/2013  . Rash and nonspecific skin eruption 03/08/2013  . Syncope 02/21/2013  . PAF (paroxysmal atrial fibrillation) ([place])  02/21/2013  . Chronic anticoagulation 02/21/2013  . Fall 02/08/2013  . Physical deconditioning 02/08/2013  . Low back pain radiating to both legs 02/08/2013  . Compression fracture of L1 lumbar vertebra (HCC) 02/08/2013  . Right tibial fracture 02/05/2013  . Obesity, unspecified 02/05/2013  . Osteomyelitis of right wrist ([place]) 11/02/2012  . Anemia 10/06/2012  . HTN (hypertension) 10/03/2012  . Psoriasis 10/03/2012  . Dyslipidemia 10/03/2012  . Vitamin D deficiency 10/03/2012  . GERD (gastroesophageal reflux disease) 10/03/2012  . Hyperglycemia 10/03/2012  . Anxiety 10/03/2012  . Depression 10/03/2012  . CAD (coronary artery disease) 10/03/2012  . DJD (degenerative joint disease) 10/03/2012  . Chronic gingivitis 10/03/2012  . Low testosterone, possible hypogonadism 10/01/2012  . Normocytic anemia 09/30/2012  . Right femoral fracture ([place]) 09/29/2012  . Right forearm fracture 09/29/2012  . Atrial fibrillation ([place]) 09/29/2012  . Hypothyroidism 09/29/2012  . History of recurrent UTIs 09/29/2012    Orientation RESPIRATION BLADDER Height & Weight     Self, Time, Situation, Place  Normal Continent Weight: 300 lb (136.1 kg) Height:  6\' 2"  (188 cm)  BEHAVIORAL SYMPTOMS/MOOD NEUROLOGICAL BOWEL NUTRITION STATUS      Continent Diet(Heart Healthy)  AMBULATORY STATUS COMMUNICATION OF NEEDS Skin   Limited Assist Verbally Normal                       Personal Care Assistance Level of Assistance  Bathing, Feeding, Dressing Bathing Assistance: Limited assistance Feeding assistance: Independent Dressing Assistance: Limited assistance     Functional Limitations Info             SPECIAL CARE FACTORS FREQUENCY  PT (By licensed  PT), OT (By licensed OT)     PT Frequency: 5 OT Frequency: 5            Contractures      Additional Factors Info  Code Status Code Status Info: Full             Current Medications (11/09/2018):  This is the current hospital active  medication list Current Facility-Administered Medications  Medication Dose Route Frequency Provider Last Rate Last Dose  . acetaminophen (TYLENOL) tablet 650 mg  650 mg Oral Q6H PRN Rise Patience, MD       Or  . acetaminophen (TYLENOL) suppository 650 mg  650 mg Rectal Q6H PRN Rise Patience, MD      . albuterol (PROVENTIL) (2.5 MG/3ML) 0.083% nebulizer solution 2.5 mg  2.5 mg Nebulization Q4H PRN Rise Patience, MD      . alfuzosin (UROXATRAL) 24 hr tablet 10 mg  10 mg Oral QHS Rise Patience, MD      . atorvastatin (LIPITOR) tablet 80 mg  80 mg Oral Daily Rise Patience, MD   80 mg at 11/09/18 1046  . buPROPion (WELLBUTRIN XL) 24 hr tablet 300 mg  300 mg Oral Daily Rise Patience, MD   300 mg at 11/09/18 1046  . clonazePAM (KLONOPIN) tablet 1 mg  1 mg Oral Q6H PRN Rise Patience, MD   1 mg at 11/09/18 0422  . diltiazem (CARDIZEM CD) 24 hr capsule 240 mg  240 mg Oral Daily Rise Patience, MD   240 mg at 11/09/18 1046  . doxycycline (VIBRA-TABS) tablet 100 mg  100 mg Oral Daily Rise Patience, MD   100 mg at 11/09/18 1059  . DULoxetine (CYMBALTA) DR capsule 60 mg  60 mg Oral BID Rise Patience, MD   60 mg at 11/09/18 1044  . ezetimibe (ZETIA) tablet 10 mg  10 mg Oral Daily Rise Patience, MD   10 mg at 11/09/18 1046  . ferrous sulfate tablet 325 mg  325 mg Oral BID WC Rise Patience, MD   325 mg at 11/09/18 7619  . HYDROcodone-acetaminophen (NORCO) 7.5-325 MG per tablet 1 tablet  1 tablet Oral QID PRN Rise Patience, MD      . levothyroxine (SYNTHROID, LEVOTHROID) tablet 225 mcg  225 mcg Oral Q0600 Rise Patience, MD   225 mcg at 11/09/18 5093  . loratadine (CLARITIN) tablet 10 mg  10 mg Oral Daily Rise Patience, MD   10 mg at 11/09/18 1059  . magnesium oxide (MAG-OX) tablet 400 mg  400 mg Oral Daily Rise Patience, MD   400 mg at 11/09/18 1059  . metoprolol tartrate (LOPRESSOR) tablet 25 mg  25 mg  Oral BID Rise Patience, MD   25 mg at 11/09/18 1046  . mirabegron ER (MYRBETRIQ) tablet 50 mg  50 mg Oral QHS Rise Patience, MD      . multivitamin with minerals tablet 1 tablet  1 tablet Oral Daily Rise Patience, MD   1 tablet at 11/09/18 1045  . nitroGLYCERIN (NITROSTAT) SL tablet 0.4 mg  0.4 mg Sublingual Q5 min PRN Rise Patience, MD      . ondansetron [facility]) tablet 4 mg  4 mg Oral Q6H PRN Rise Patience, MD       Or  . ondansetron [facility]) injection 4 mg  4 mg Intravenous Q6H PRN Rise Patience, MD      .  pantoprazole (PROTONIX) EC tablet 40 mg  40 mg Oral Daily Rise Patience, MD   40 mg at 11/09/18 1045  . rivaroxaban (XARELTO) tablet 10 mg  10 mg Oral Daily Rise Patience, MD   10 mg at 11/09/18 1046  . sodium chloride flush (NS) 0.9 % injection 3 mL  3 mL Intravenous Once Rise Patience, MD      . tiZANidine (ZANAFLEX) tablet 4 mg  4 mg Oral Q6H PRN Rise Patience, MD      . vitamin C (ASCORBIC ACID) tablet 500 mg  500 mg Oral Daily Rise Patience, MD   500 mg at 11/09/18 1046   Current Outpatient Medications  Medication Sig Dispense Refill  . acetaminophen (TYLENOL) 500 MG tablet Take 1,000 mg by mouth daily as needed for moderate pain.    [name] alfuzosin (UROXATRAL) 10 MG 24 hr tablet Take 10 mg by mouth at bedtime.    [name] atorvastatin (LIPITOR) 80 MG tablet Take 80 mg by mouth daily.    [name] buPROPion (WELLBUTRIN XL) 300 MG 24 hr tablet TAKE 1 TABLET DAILY WITH  BREAKFAST BY MOUTH. (Patient taking differently: Take 300 mg by mouth daily. ) 90 tablet 3  . Calcium Citrate-Vitamin D (CALCIUM CITRATE + D PO) Take 1 tablet by mouth 2 (two) times daily.     . Cholecalciferol (VITAMIN D-3 PO) Take 2,000 Units by mouth 2 (two) times daily.     . clonazePAM (KLONOPIN) 1 MG tablet Take 1 tablet (1 mg total) by mouth every 6 (six) hours as needed for anxiety. TAKE 1 TABLET BY MOUTH  EVERY 6 HOURS prn 360 tablet 1  . denosumab  (PROLIA) 60 MG/ML SOLN injection Inject 60 mg into the skin every 6 (six) months. Administer in upper arm, thigh, or abdomen    . diltiazem (CARDIZEM CD) 240 MG 24 hr capsule TAKE 1 CAPSULE BY MOUTH  DAILY 90 capsule 3  . doxycycline (VIBRA-TABS) 100 MG tablet TAKE 1 TABLET BY MOUTH  DAILY 90 tablet 1  . DULoxetine (CYMBALTA) 60 MG capsule TAKE 1 CAPSULE BY MOUTH  TWICE DAILY 180 capsule 1  . ezetimibe (ZETIA) 10 MG tablet Take 1 tablet (10 mg total) by mouth daily. 90 tablet 3  . ferrous sulfate 325 (65 FE) MG tablet Take 325 mg by mouth 2 (two) times daily with a meal.    . HYDROcodone-acetaminophen (NORCO) 7.5-325 MG tablet Take 1 tablet by mouth 4 (four) times daily as needed for moderate pain. Please fill on 11/02/18 120 tablet 0  . levothyroxine (SYNTHROID, LEVOTHROID) 200 MCG tablet TAKE 1 TABLET DAILY AT 6 AM BY MOUTH. (Patient taking differently: Take 225 mcg by mouth daily before breakfast. ) 90 tablet 3  . levothyroxine (SYNTHROID, LEVOTHROID) 25 MCG tablet TAKE 1 TABLET BY MOUTH  DAILY BEFORE BREAKFAST. (Patient taking differently: Take 225 mcg by mouth daily before breakfast. ) 90 tablet 3  . loratadine (CLARITIN) 10 MG tablet Take 10 mg by mouth daily.     . Magnesium 500 MG CAPS Take 1 capsule (500 mg total) by mouth daily. 30 capsule 1  . metoprolol tartrate (LOPRESSOR) 25 MG tablet Take 1 tablet (25 mg total) by mouth 2 (two) times daily. 180 tablet 1  . mirabegron ER (MYRBETRIQ) 50 MG TB24 tablet Take 50 mg by mouth at bedtime.     . Multiple Vitamin (MULTIVITAMIN WITH MINERALS) TABS tablet Take 1 tablet by mouth daily. Men's One-A-Day 37+    .  omeprazole (PRILOSEC) 40 MG capsule TAKE 1 CAPSULE DAILY BEFORE BREAKFAST BY MOUTH. (Patient taking differently: Take 40 mg by mouth daily. ) 90 capsule 3  . PROAIR HFA 108 (90 Base) MCG/ACT inhaler Inhale 1 puff into the lungs every 4 (four) hours as needed.    . Probiotic Product (PROBIOTIC PO) Take 1 capsule by mouth daily with breakfast.     . testosterone enanthate (DELATESTRYL) 200 MG/ML injection INJECT 1ML (200MG  TOTAL) INTO THE MUSCLE EVERY 14 DAYS. FOR INTRAMUSCULAR USE ONLY. DISCARD VIAL AFTER 28 DAYS. (Patient taking differently: Inject 100 mg into the muscle every 14 (fourteen) days. ) 5 mL 4  . tiZANidine (ZANAFLEX) 4 MG tablet TAKE 1 TABLET BY MOUTH  EVERY 6 HOURS AS NEEDED FOR MUSCLE SPASM(S) (Patient taking differently: Take 4 mg by mouth every 6 (six) hours as needed for muscle spasms. ) 360 tablet 1  . vitamin C (ASCORBIC ACID) 500 MG tablet Take 500 mg by mouth daily.    Alveda Reasons 10 MG TABS tablet TAKE 1 TABLET DAILY BY  MOUTH. 90 tablet 3  . BELSOMRA 15 MG TABS Take 1 tablet by mouth daily. (Patient not taking: Reported on 11/08/2018) 90 tablet 1  . EPINEPHrine (EPIPEN 2-PAK) 0.3 mg/0.3 mL IJ SOAJ injection Inject 0.3 mg into the muscle daily as needed (for anaphylactic reaction).    [name] lidocaine-prilocaine (EMLA) cream Apply to affected area once (Patient not taking: Reported on 11/08/2018) 30 g 3  . LORazepam (ATIVAN) 0.5 MG tablet Take 1 tablet (0.5 mg total) by mouth every 6 (six) hours as needed (Nausea or vomiting). (Patient not taking: Reported on 11/08/2018) 30 tablet 0  . nitroGLYCERIN (NITROSTAT) 0.4 MG SL tablet Place 0.4 mg under the tongue every 5 (five) minutes as needed for chest pain. x3 doses as needed for chest pain    . ondansetron (ZOFRAN) 8 MG tablet Take 1 tablet (8 mg total) by mouth 2 (two) times daily as needed for refractory nausea / vomiting. chemotherapy. (Patient not taking: Reported on 11/08/2018) 30 tablet 1  . predniSONE (DELTASONE) 20 MG tablet Take 3 tablets (60 mg total) by mouth daily. Take on days 1-5 of chemotherapy. (Patient not taking: Reported on 11/08/2018) 50 tablet 0  . prochlorperazine (COMPAZINE) 10 MG tablet Take 1 tablet (10 mg total) by mouth every 6 (six) hours as needed (Nausea or vomiting). (Patient not taking: Reported on 11/08/2018) 30 tablet 6  . triamcinolone ointment  (KENALOG) 0.5 % APPLY TO AFFECTED AREA TWICE A DAY. (Patient not taking: Reported on 11/08/2018) 30 g 0     Discharge Medications: Please see discharge summary for a list of discharge medications.  Relevant Imaging Results:  Relevant Lab Results:   Additional Information SSN: [id number]   Patient is 6'2'' and 300lbs.  Ollen Barges, LCSWA

## 2018-11-09 NOTE — Evaluation (Signed)
Physical Therapy Evaluation Patient Details Name: [name] MRN: [id number] DOB: 09-Apr-1946 Today's Date: 11/09/2018   History of Present Illness  73 y.o. male with history of lymphoma presently on chemotherapy last 1 week ago, atrial fibrillation on Xarelto, sleep apnea, anxiety depression, CAD, and multiple orthopaedic surgeries presents to the ER with complaints of increasing weakness   Clinical Impression  Pt admitted with above diagnosis. Pt currently with functional limitations due to the deficits listed below (see PT Problem List).  Pt will benefit from skilled PT to increase their independence and safety with mobility to allow discharge to the venue listed below.   Pt reports weakness and LEs buckling since recent chemo treatment.  Pt agreeable to SNF for rehab and states he has been to a few in the past.  Currently recommend SNF upon d/c for improving strength, coordination and mobility to assist with eventual safe d/c home with spouse.     Follow Up Recommendations SNF    Equipment Recommendations  None recommended by PT    Recommendations for Other Services       Precautions / Restrictions Precautions Precautions: Fall Restrictions Weight Bearing Restrictions: No      Mobility  Bed Mobility Overal bed mobility: Needs Assistance Bed Mobility: Supine to Sit;Sit to Supine     Supine to sit: Min guard;HOB elevated Sit to supine: Min guard;HOB elevated   General bed mobility comments: utilized momentum to self assist LEs  Transfers Overall transfer level: Needs assistance Equipment used: Rolling walker (2 wheeled) Transfers: Sit to/from Stand Sit to Stand: Mod assist;From elevated surface         General transfer comment: verbal cues for weight shift and locking knees in extension (pt reports frequent LEs buckling prior to admission), unable to perform first 2 attempts however able to rise with assist on third; pt marched in place however very unsteady so  did not move away from bed for safety  Ambulation/Gait                Stairs            Wheelchair Mobility    Modified Rankin (Stroke Patients Only)       Balance Overall balance assessment: History of Falls;Needs assistance         Standing balance support: Bilateral upper extremity supported Standing balance-Leahy Scale: Poor Standing balance comment: requires UE support                             Pertinent Vitals/Pain Pain Assessment: No/denies pain    Home Living Family/patient expects to be discharged to:: Private residence Living Arrangements: Spouse/significant other   Type of Home: House Home Access: Stairs to enter Entrance Stairs-Rails: Right Entrance Stairs-Number of Steps: 4 Home Layout: Two level Home Equipment: Walker - 2 wheels;Cane - single point;Bedside commode;Wheelchair - manual      Prior Function Level of Independence: Independent with assistive device(s)         Comments: was using RW due to chemo     Hand Dominance        Extremity/Trunk Assessment        Lower Extremity Assessment Lower Extremity Assessment: Generalized weakness;RLE deficits/detail;LLE deficits/detail RLE Deficits / Details: R LE presents grossly 3/5 throughout (pt reports this is weaker leg at baseline due to multiple orthopaedic surgeries) LLE Deficits / Details: grossly 4-/5 throughout       Communication   Communication: No difficulties  Cognition Arousal/Alertness: Awake/alert Behavior During Therapy: WFL for tasks assessed/performed Overall Cognitive Status: Within Functional Limits for tasks assessed                                        General Comments      Exercises     Assessment/Plan    PT Assessment Patient needs continued PT services  PT Problem List Decreased strength;Decreased mobility;Decreased balance;Decreased activity tolerance;Decreased knowledge of use of DME;Decreased  coordination       PT Treatment Interventions DME instruction;Functional mobility training;Gait training;Therapeutic activities;Neuromuscular re-education;Patient/family education;Balance training;Wheelchair mobility training;Therapeutic exercise    PT Goals (Current goals can be found in the Care Plan section)  Acute Rehab PT Goals PT Goal Formulation: With patient Time For Goal Achievement: 11/23/18 Potential to Achieve Goals: Good    Frequency Min 2X/week   Barriers to discharge        Co-evaluation               AM-PAC PT "6 Clicks" Mobility  Outcome Measure Help needed turning from your back to your side while in a flat bed without using bedrails?: A Little Help needed moving from lying on your back to sitting on the side of a flat bed without using bedrails?: A Lot Help needed moving to and from a bed to a chair (including a wheelchair)?: A Lot Help needed standing up from a chair using your arms (e.g., wheelchair or bedside chair)?: Total Help needed to walk in hospital room?: Total Help needed climbing 3-5 steps with a railing? : Total 6 Click Score: 10    End of Session Equipment Utilized During Treatment: Gait belt Activity Tolerance: Patient tolerated treatment well Patient left: in bed;with call bell/phone within reach Nurse Communication: Mobility status PT Visit Diagnosis: Other abnormalities of gait and mobility (R26.89)    Time: 0960-4540 PT Time Calculation (min) (ACUTE ONLY): 23 min   Charges:   PT Evaluation $PT Eval Low Complexity: [place], PT, DPT Acute Rehabilitation Services Office: [phone] Pager: [phone]  Trena Platt 11/09/2018, 4:44 PM

## 2018-11-09 NOTE — ED Notes (Signed)
ED TO INPATIENT HANDOFF REPORT  Name/Age/Gender [name] 73 y.o. male  Code Status    Code Status Orders  (From admission, onward)         Start     Ordered   11/09/18 0316  Full code  Continuous     11/09/18 0316        Code Status History    Date Active Date Inactive Code Status Order ID Comments User Context   10/24/2017 1459 10/25/2017 1630 Full Code [id number]  Cleon Gustin, MD Inpatient   07/15/2017 1221 07/16/2017 1707 Full Code [id number]  Leighton Parody, PA-C Inpatient   04/11/2017 1716 04/13/2017 1724 Full Code [id number]  Leighton Parody, PA-C Inpatient   02/08/2017 1123 02/09/2017 1722 Full Code [id number]  Cleon Gustin, MD Inpatient   11/03/2016 0027 11/05/2016 2205 Full Code [id number]  Ivor Costa, MD ED   11/15/2013 1700 11/19/2013 1824 Full Code [id number]  Pricilla Loveless, PA-C Inpatient   10/26/2013 1709 10/30/2013 1739 Full Code [id number]  Pricilla Loveless, PA-C Inpatient   09/17/2013 1856 09/20/2013 1622 Full Code [id number]  Pricilla Loveless, PA-C Inpatient   02/21/2013 0322 02/24/2013 1707 Full Code [id number]  [name], MD Inpatient   02/03/2013 0049 02/09/2013 1825 Full Code [id number]  [name] Schooling, MD ED   09/29/2012 0311 10/10/2012 1814 Full Code [id number]  Abbott Pao, RN Inpatient    Advance Directive Documentation     Most Recent Value  Type of Advance Directive  Healthcare Power of Attorney, Living will  Pre-existing out of facility DNR order (yellow form or pink MOST form)  -  "MOST" Form in Place?  -      Home/SNF/Other Home  Chief Complaint Weakness  Level of Care/Admitting Diagnosis ED Disposition    ED Disposition Condition [place]: [facility] [100102]  Level of Care: Telemetry [5]  Admit to tele based on following criteria: Monitor for Ischemic changes  Diagnosis: Weakness [700174]  Admitting Physician: Rise Patience [phone]  Attending Physician: Rise Patience Lei.Right  PT Class (Do Not Modify): Observation [104]  PT Acc Code (Do Not Modify): Observation [10022]       Medical History Past Medical History:  Diagnosis Date  . A-fib (HCC)    hx.- sinus rhythm now  . Acid reflux   . AKI (acute kidney injury) (HCC)    mild  . Anxiety   . Arthritis   . Atrial dilatation, bilateral    Severly, enlargement  per ECHO 11/03/16  . BPH (benign prostatic hyperplasia)   . CAD (coronary artery disease)   . Cancer ([place])   . Depression   . Dislocation of internal right hip prosthesis, sequela   . Dysrhythmia    hx a fib  . Elevated brain natriuretic peptide (BNP) level   . Environmental allergies   . Fracture of wrist 10/23/13   LEFT  . High cholesterol   . History of Graves' disease   . History of kidney stones    surg. removal 02/2017  . History of syncope   . HLD (hyperlipidemia)   . Hypertension   . Hypothyroidism    thyroid removed  . Left knee injury    cap dislocated  . Low testosterone, possible hypogonadism 10/01/2012  . Lumbago   . Metabolic bone disease   . Multiple falls   . Osteoporosis    Severe  . Other abnormalities of gait and  mobility   . Plaque psoriasis   . Sleep apnea    cpap machine-settings 17  . Thrombocytopenia ([place])   . Thyroid disease    HX GRAVES DISEASE  . Transfusion history    s/p 12'13 hip surgery  . Unsteady gait   . UTI (urinary tract infection)   . Vertebral compression fracture (HCC)    L1-wears brace    Allergies Allergies  Allergen Reactions  . Short Ragweed Pollen Ext Cough    IV Location/Drains/Wounds Patient Lines/Drains/Airways Status   Active Line/Drains/Airways    Name:   Placement date:   Placement time:   Site:   Days:   Implanted Port 06/22/18 Right Chest   06/22/18    1239    Chest   140   Peripheral IV 11/08/18 Left Hand   11/08/18    2038    Hand   1   Peripheral IV 11/08/18 Left Wrist   11/08/18    2224    Wrist   1   Ureteral Drain/Stent Left ureter 6 Fr.   10/24/17     1316    Left ureter   381   Ureteral Drain/Stent Left ureter 6 Fr.   11/07/17    -    Left ureter   367          Labs/Imaging Results for orders placed or performed during the hospital encounter of 11/08/18 (from the past 48 hour(s))  Basic metabolic panel     Status: Abnormal   Collection Time: 11/08/18  8:43 PM  Result Value Ref Range   Sodium 136 135 - 145 mmol/L   Potassium 4.9 3.5 - 5.1 mmol/L   Chloride 101 98 - 111 mmol/L   CO2 27 22 - 32 mmol/L   Glucose, Bld 100 (H) 70 - 99 mg/dL   BUN 24 (H) 8 - 23 mg/dL   Creatinine, Ser 1.25 (H) 0.61 - 1.24 mg/dL   Calcium 9.3 8.9 - 10.3 mg/dL   GFR calc non Af Amer 57 (L) >60 mL/min   GFR calc Af Amer >60 >60 mL/min   Anion gap 8 5 - 15    Comment: Performed at [facility], [place] [address], [place], [place] [address]  CBC     Status: Abnormal   Collection Time: 11/08/18  8:43 PM  Result Value Ref Range   WBC 6.7 4.0 - 10.5 K/uL   RBC 3.43 (L) 4.22 - 5.81 MIL/uL   Hemoglobin 11.4 (L) 13.0 - 17.0 g/dL   HCT 36.1 (L) 39.0 - 52.0 %   MCV 105.2 (H) 80.0 - 100.0 fL   MCH 33.2 26.0 - 34.0 pg   MCHC 31.6 30.0 - 36.0 g/dL   RDW 15.1 11.5 - 15.5 %   Platelets 173 150 - 400 K/uL   nRBC 0.0 0.0 - 0.2 %    Comment: Performed at [facility], [place] [address] [place], [place] [address]  Urinalysis, Routine w reflex microscopic     Status: Abnormal   Collection Time: 11/08/18  8:43 PM  Result Value Ref Range   Color, Urine YELLOW YELLOW   APPearance CLOUDY (A) CLEAR   Specific Gravity, Urine 1.016 1.005 - 1.030   pH 6.0 5.0 - 8.0   Glucose, UA NEGATIVE NEGATIVE mg/dL   Hgb urine dipstick LARGE (A) NEGATIVE   Bilirubin Urine NEGATIVE NEGATIVE   Ketones, ur NEGATIVE NEGATIVE mg/dL   Protein, ur 100 (A) NEGATIVE mg/dL   Nitrite NEGATIVE  NEGATIVE   Leukocytes, UA LARGE (A) NEGATIVE   RBC / HPF >50 (H) 0 - 5 RBC/hpf   WBC, UA >50 (H) 0 - 5 WBC/hpf   Bacteria, UA RARE (A) NONE SEEN   Squamous  Epithelial / LPF 0-5 0 - 5   Mucus PRESENT    Hyaline Casts, UA PRESENT    Ca Oxalate Crys, UA PRESENT     Comment: Performed at [facility], [place] [address] [place], [place] [address]  CBG monitoring, ED     Status: None   Collection Time: 11/08/18  9:00 PM  Result Value Ref Range   Glucose-Capillary 89 70 - 99 mg/dL  Differential     Status: None   Collection Time: 11/08/18  9:04 PM  Result Value Ref Range   Neutrophils Relative % 71 %   Neutro Abs 4.7 1.7 - 7.7 K/uL   Lymphocytes Relative 13 %   Lymphs Abs 0.9 0.7 - 4.0 K/uL   Monocytes Relative 10 %   Monocytes Absolute 0.7 0.1 - 1.0 K/uL   Eosinophils Relative 4 %   Eosinophils Absolute 0.3 0.0 - 0.5 K/uL   Basophils Relative 1 %   Basophils Absolute 0.0 0.0 - 0.1 K/uL   Immature Granulocytes 1 %   Abs Immature Granulocytes 0.06 0.00 - 0.07 K/uL    Comment: Performed at [facility], [place] [address] [place], [place] [address]  Troponin I - Once     Status: Abnormal   Collection Time: 11/08/18  9:04 PM  Result Value Ref Range   Troponin I 0.04 (HH) <0.03 ng/mL    Comment: CRITICAL RESULT CALLED TO, READ BACK BY AND VERIFIED WITH: RN [facility] AT 2347 11/08/18 CRUICKSHANK A Performed at [facility], [place] [address] [place], [place] [address]   Lactic acid, plasma     Status: None   Collection Time: 11/08/18  9:04 PM  Result Value Ref Range   Lactic Acid, Venous 1.4 0.5 - 1.9 mmol/L    Comment: Performed at [facility], [place] [address] [place], [place] [address]  Lactic acid, plasma     Status: None   Collection Time: 11/08/18 11:04 PM  Result Value Ref Range   Lactic Acid, Venous 1.0 0.5 - 1.9 mmol/L    Comment: Performed at [facility], [place] [address], [place], [place] [address]  Troponin I - Now Then Q6H     Status: Abnormal   Collection Time: 11/09/18  4:24 AM  Result Value Ref Range   Troponin I 0.04 (HH) <0.03 ng/mL     Comment: CRITICAL VALUE NOTED.  VALUE IS CONSISTENT WITH PREVIOUSLY REPORTED AND CALLED VALUE. Performed at [facility], [place] [address] [place], [place] [address]   TSH     Status: Abnormal   Collection Time: 11/09/18  4:25 AM  Result Value Ref Range   TSH 0.130 (L) 0.350 - 4.500 uIU/mL    Comment: Performed by a 3rd Generation assay with a functional sensitivity of <=0.01 uIU/mL. Performed at [facility], [place] [address] [place],  [address]   Basic metabolic panel     Status: Abnormal   Collection Time: 11/09/18  5:00 AM  Result Value Ref Range   Sodium 136 135 - 145 mmol/L   Potassium 3.5 3.5 - 5.1 mmol/L    Comment: DELTA CHECK NOTED   Chloride 102 98 - 111 mmol/L   CO2 26 22 - 32  mmol/L   Glucose, Bld 102 (H) 70 - 99 mg/dL   BUN 23 8 - 23 mg/dL   Creatinine, Ser 1.04 0.61 - 1.24 mg/dL   Calcium 8.7 (L) 8.9 - 10.3 mg/dL   GFR calc non Af Amer >60 >60 mL/min   GFR calc Af Amer >60 >60 mL/min   Anion gap 8 5 - 15    Comment: Performed at [facility], [place] [address], [place], [place] [address]  CBC     Status: Abnormal   Collection Time: 11/09/18  5:00 AM  Result Value Ref Range   WBC 6.4 4.0 - 10.5 K/uL   RBC 3.29 (L) 4.22 - 5.81 MIL/uL   Hemoglobin 10.9 (L) 13.0 - 17.0 g/dL   HCT 34.6 (L) 39.0 - 52.0 %   MCV 105.2 (H) 80.0 - 100.0 fL   MCH 33.1 26.0 - 34.0 pg   MCHC 31.5 30.0 - 36.0 g/dL   RDW 14.9 11.5 - 15.5 %   Platelets 166 150 - 400 K/uL   nRBC 0.0 0.0 - 0.2 %    Comment: Performed at [facility], [place] [address], [place], [place] [address]  Troponin I - Now Then Q6H     Status: Abnormal   Collection Time: 11/09/18  9:17 AM  Result Value Ref Range   Troponin I 0.04 (HH) <0.03 ng/mL    Comment: CRITICAL VALUE NOTED.  VALUE IS CONSISTENT WITH PREVIOUSLY REPORTED AND CALLED VALUE. Performed at [facility], [place] [address] [place], [place] [address]   CK      Status: None   Collection Time: 11/09/18  9:17 AM  Result Value Ref Range   Total CK 64 49 - 397 U/L    Comment: Performed at [facility], [place] [address] [place], [place] [address]   Dg Chest 2 View  Result Date: 11/08/2018 CLINICAL DATA:  Weakness. EXAM: CHEST - 2 VIEW COMPARISON:  Radiographs of December 11, 2016. FINDINGS: The heart size and mediastinal contours are within normal limits. Both lungs are clear. Interval placement of right internal jugular Port-A-Cath with distal tip in expected position of the SVC. The visualized skeletal structures are unremarkable. IMPRESSION: No active cardiopulmonary disease. Electronically Signed   By: Marijo Conception, M.D.   On: 11/08/2018 21:38   Ct Head Wo Contrast  Result Date: 11/08/2018 CLINICAL DATA:  Worsening generalized weakness. On treatment for lymphoma. EXAM: CT HEAD WITHOUT CONTRAST TECHNIQUE: Contiguous axial images were obtained from the base of the skull through the vertex without intravenous contrast. COMPARISON:  PET-CT August 22, 2018 FINDINGS: BRAIN: No intraparenchymal hemorrhage, mass effect nor midline shift. No parenchymal brain volume loss for age. No hydrocephalus. Patchy supratentorial white matter hypodensities within normal range for patient's age, though non-specific are most compatible with chronic small vessel ischemic disease. No acute large vascular territory infarcts. No abnormal extra-axial fluid collections. Basal cisterns are patent. VASCULAR: Mild calcific atherosclerosis of the carotid siphons. SKULL: No skull fracture. 1 cm probable bone island occipital calvarium. No significant scalp soft tissue swelling. SINUSES/ORBITS: Trace paranasal sinus mucosal thickening. Mastoid air cells are well aerated.The included ocular globes and orbital contents are non-suspicious. Status post bilateral ocular lens implants. OTHER: None. IMPRESSION: Normal CT HEAD for age. Electronically Signed   By: Elon Alas  M.D.   On: 11/08/2018 22:34   EKG Interpretation  Date/Time:  Wednesday November 08 2018 20:58:23 EST Ventricular Rate:  83 PR Interval:  QRS Duration: 116 QT Interval:  421 QTC Calculation: 498 R Axis:   -45 Text Interpretation:  Atrial fibrillation Ventricular premature complex LAD, consider left anterior fascicular block Low voltage, precordial leads When compared with ECG of 12/11/2016 No significant change was found Confirmed by Francine Graven [phone]) on 11/08/2018 9:30:12 PM Also confirmed by Francine Graven [phone]), editor Shon Hale [phone])  on 11/09/2018 8:58:04 AM   Pending Labs Unresulted Labs (From admission, onward)    Start     Ordered   11/09/18 0316  Troponin I - Now Then Q6H  Now then every 6 hours,   R     11/09/18 0316   11/08/18 2104  Urine culture  ONCE - STAT,   STAT     11/08/18 2104          Vitals/Pain Today's Vitals   11/09/18 0920 11/09/18 1000 11/09/18 1200 11/09/18 1300  BP: (!) 141/76 (!) 169/70 (!) 176/81 (!) 162/79  Pulse: 95 73 72 79  Resp: 18     Temp:      TempSrc:      SpO2: 97% 95% 94% 92%  Weight:      Height:      PainSc:        Isolation Precautions No active isolations  Medications Medications  sodium chloride flush (NS) 0.9 % injection 3 mL (3 mLs Intravenous Not Given 11/09/18 0425)  HYDROcodone-acetaminophen (NORCO) 7.5-325 MG per tablet 1 tablet (has no administration in time range)  doxycycline (VIBRA-TABS) tablet 100 mg (100 mg Oral Given 11/09/18 1059)  atorvastatin (LIPITOR) tablet 80 mg (80 mg Oral Given 11/09/18 1046)  diltiazem (CARDIZEM CD) 24 hr capsule 240 mg (240 mg Oral Given 11/09/18 1046)  ezetimibe (ZETIA) tablet 10 mg (10 mg Oral Given 11/09/18 1046)  metoprolol tartrate (LOPRESSOR) tablet 25 mg (25 mg Oral Given 11/09/18 1046)  nitroGLYCERIN (NITROSTAT) SL tablet 0.4 mg (has no administration in time range)  buPROPion (WELLBUTRIN XL) 24 hr tablet 300 mg (300 mg Oral Given 11/09/18 1046)  DULoxetine  (CYMBALTA) DR capsule 60 mg (60 mg Oral Given 11/09/18 1044)  levothyroxine (SYNTHROID, LEVOTHROID) tablet 225 mcg (225 mcg Oral Given 11/09/18 0728)  pantoprazole (PROTONIX) EC tablet 40 mg (40 mg Oral Given 11/09/18 1045)  alfuzosin (UROXATRAL) 24 hr tablet 10 mg (has no administration in time range)  mirabegron ER (MYRBETRIQ) tablet 50 mg (has no administration in time range)  ferrous sulfate tablet 325 mg (325 mg Oral Given 11/09/18 0907)  rivaroxaban (XARELTO) tablet 10 mg (10 mg Oral Given 11/09/18 1046)  clonazePAM (KLONOPIN) tablet 1 mg (1 mg Oral Given 11/09/18 0422)  tiZANidine (ZANAFLEX) tablet 4 mg (has no administration in time range)  magnesium oxide (MAG-OX) tablet 400 mg (400 mg Oral Given 11/09/18 1059)  multivitamin with minerals tablet 1 tablet (1 tablet Oral Given 11/09/18 1045)  vitamin C (ASCORBIC ACID) tablet 500 mg (500 mg Oral Given 11/09/18 1046)  loratadine (CLARITIN) tablet 10 mg (10 mg Oral Given 11/09/18 1059)  acetaminophen (TYLENOL) tablet 650 mg (has no administration in time range)    Or  acetaminophen (TYLENOL) suppository 650 mg (has no administration in time range)  ondansetron (ZOFRAN) tablet 4 mg (has no administration in time range)    Or  ondansetron (ZOFRAN) injection 4 mg (has no administration in time range)  albuterol (PROVENTIL) (2.5 MG/3ML) 0.083% nebulizer solution 2.5 mg (has no administration in time range)  sodium chloride 0.9 % bolus 500 mL (0 mLs Intravenous Stopped 11/09/18 0340)  cefTRIAXone (ROCEPHIN) 1 g in sodium chloride 0.9 % 100 mL IVPB (0 g Intravenous Stopped 11/09/18 0339)    Mobility walks with device

## 2018-11-09 NOTE — H&P (Signed)
History and Physical    [name] OZD:[id number] DOB: Feb 11, 1946 DOA: 11/08/2018  PCP: Cassandria Anger, MD  Patient coming from: Home.  Chief Complaint: Weakness of the lower extremity.  HPI: [name] is a 73 y.o. male with history of lymphoma presently on chemotherapy last 1 week ago, atrial fibrillation on Xarelto, sleep apnea, anxiety depression, CAD presents to the ER with complaints of increasing weakness and difficulty ambulating.  Patient states since patient has been started on chemo patient has become more weak.  Patient states he had a multiple surgeries on his right lower extremity and also few surgeries on the left lower extremity about 7 years ago following which patient has become very weak in the extremities but eventually has gained more strength but getting weak again after he was started on chemo.  Denies any incontinence of urine but has been having frequency and some foul-smelling urine recently.  Has had recurrent UTI previously.  Denies chest pain or shortness of breath.  Has some fatigue.  ED Course: In the ER patient UA is compatible with UTI.  CT head is unremarkable chest x-ray unremarkable.  EKG shows A. fib.  Troponin mildly elevated.  On trying to ambulate patient finds it very difficult to ambulate.  Recent physical therapy notes shows that patient has difficulty ambulating.  Patient admitted for further work-up of weakness UTI mildly elevated troponin.  Review of Systems: As per HPI, rest all negative.   Past Medical History:  Diagnosis Date  . A-fib (HCC)    hx.- sinus rhythm now  . Acid reflux   . AKI (acute kidney injury) (HCC)    mild  . Anxiety   . Arthritis   . Atrial dilatation, bilateral    Severly, enlargement  per ECHO 11/03/16  . BPH (benign prostatic hyperplasia)   . CAD (coronary artery disease)   . Cancer ([place])   . Depression   . Dislocation of internal right hip prosthesis, sequela   . Dysrhythmia    hx a fib  .  Elevated brain natriuretic peptide (BNP) level   . Environmental allergies   . Fracture of wrist 10/23/13   LEFT  . High cholesterol   . History of Graves' disease   . History of kidney stones    surg. removal 02/2017  . History of syncope   . HLD (hyperlipidemia)   . Hypertension   . Hypothyroidism    thyroid removed  . Left knee injury    cap dislocated  . Low testosterone, possible hypogonadism 10/01/2012  . Lumbago   . Metabolic bone disease   . Multiple falls   . Osteoporosis    Severe  . Other abnormalities of gait and mobility   . Plaque psoriasis   . Sleep apnea    cpap machine-settings 17  . Thrombocytopenia ([place])   . Thyroid disease    HX GRAVES DISEASE  . Transfusion history    s/p 12'13 hip surgery  . Unsteady gait   . UTI (urinary tract infection)   . Vertebral compression fracture (HCC)    L1-wears brace    Past Surgical History:  Procedure Laterality Date  . APPENDECTOMY    . CARDIAC CATHETERIZATION  2013  . CATARACT EXTRACTION, BILATERAL    . COLONOSCOPY    . CYSTOSCOPY WITH RETROGRADE PYELOGRAM, URETEROSCOPY AND STENT PLACEMENT Left 11/07/2017   Procedure: CYSTOSCOPY WITH RETROGRADE PYELOGRAM, URETEROSCOPY AND STENT PLACEMENT;  Surgeon: Cleon Gustin, MD;  Location: [facility];  Service: Urology;  Laterality: Left;  . CYSTOSCOPY/URETEROSCOPY/HOLMIUM LASER/STENT PLACEMENT Right 03/11/2017   Procedure: CYSTOSCOPY/URETEROSCOPYSTENT PLACEMENT right ureter retrograde pylegram;  Surgeon: Cleon Gustin, MD;  Location: [facility];  Service: Urology;  Laterality: Right;  . HARDWARE REMOVAL  10/05/2012   Procedure: HARDWARE REMOVAL;  Surgeon: [name], MD;  Location: [facility];  Service: Orthopedics;  Laterality: Right;  REMOVING  STRYKER  GAMMA NAIL  . HARDWARE REMOVAL Right 07/03/2013   Procedure: HARDWARE REMOVAL RIGHT TIBIA ;  Surgeon: Rozanna Box, MD;  Location: [place];  Service: Orthopedics;  Laterality: Right;  . HIP CLOSED REDUCTION Right 10/15/2013     Procedure: CLOSED MANIPULATION HIP;  Surgeon: [name], MD;  [facility]: [facility];  Service: Orthopedics;  Laterality: Right;  . hip revision     June 2017  . HOLMIUM LASER APPLICATION Right 7/0/2637   Procedure: HOLMIUM LASER APPLICATION;  Surgeon: Cleon Gustin, MD;  [facility];  Service: Urology;  Laterality: Right;  . INCISION AND DRAINAGE HIP Right 11/16/2013   Procedure: IRRIGATION AND DEBRIDEMENT RIGHT HIP;  Surgeon: [name], MD;  [facility];  Service: Orthopedics;  Laterality: Right;  . IR IMAGING GUIDED PORT INSERTION  06/22/2018  . IR URETERAL STENT LEFT NEW ACCESS W/O SEP NEPHROSTOMY CATH  10/24/2017  . JOINT REPLACEMENT     hip-right x2  . KNEE SURGERY Bilateral 2012   Total knee replacements  . NEPHROLITHOTOMY Right 02/08/2017   Procedure: NEPHROLITHOTOMY PERCUTANEOUS WITH SURGEON ACCESS;  Surgeon: Cleon Gustin, MD;  [facility];  Service: Urology;  Laterality: Right;  . NEPHROLITHOTOMY Left 10/24/2017   Procedure: NEPHROLITHOTOMY PERCUTANEOUS;  Surgeon: Cleon Gustin, MD;  [facility];  Service: Urology;  Laterality: Left;  . ORIF TIBIA FRACTURE Right 02/06/2013   Procedure: OPEN REDUCTION INTERNAL FIXATION (ORIF) TIBIA FRACTURE WITH IM ROD FIBULA;  Surgeon: Rozanna Box, MD;  Location: [place];  Service: Orthopedics;  Laterality: Right;  . ORIF TIBIA FRACTURE Right 07/03/2013   Procedure: RIGHT TIBIA NON UNION REPAIR ;  Surgeon: Rozanna Box, MD;  Location: [place];  Service: Orthopedics;  Laterality: Right;  . ORIF WRIST FRACTURE  10/02/2012   Procedure: OPEN REDUCTION INTERNAL FIXATION (ORIF) WRIST FRACTURE;  Surgeon: Roseanne Kaufman, MD;  [facility];  Service: Orthopedics;  Laterality: Right;  WITH   ANTIBIOTIC  CEMENT  . ORIF WRIST FRACTURE Left 10/28/2013   Procedure: OPEN REDUCTION INTERNAL FIXATION (ORIF) WRIST FRACTURE with allograft;  Surgeon: Roseanne Kaufman, MD;  [facility];  Service: Orthopedics;  Laterality:  Left;  DVR Plate  . QUADRICEPS TENDON REPAIR Left 07/15/2017   Procedure: REPAIR QUADRICEP TENDON;  Surgeon: Frederik Pear, MD;  Location: [place];  Service: Orthopedics;  Laterality: Left;  . right femur surgery  05/2012  . THYROIDECTOMY  02/1986  . TOTAL HIP REVISION  10/05/2012   Procedure: TOTAL HIP REVISION;  Surgeon: [name], MD;  [facility];  Service: Orthopedics;  Laterality: Right;  RIGHT TOTAL HIP REVISION  . TOTAL HIP REVISION Right 09/17/2013   Procedure: REVISION RIGHT TOTAL HIP ARTHROPLASTY ;  Surgeon: [name], MD;  [facility];  Service: Orthopedics;  Laterality: Right;  . TOTAL HIP REVISION Right 10/26/2013   Procedure: REVISION RIGHT TOTAL HIP ARTHROPLASTY;  Surgeon: [name], MD;  [facility];  Service: Orthopedics;  Laterality: Right;  . TOTAL HIP REVISION  03/2016  . TOTAL KNEE REVISION Left 04/11/2017  Procedure: TOTAL KNEE REVISION PATELLA and TIBIA;  Surgeon: Frederik Pear, MD;  Location: [place];  Service: Orthopedics;  Laterality: Left;  . WRIST FRACTURE SURGERY  05/2012     reports that he quit smoking about 6 years ago. He has never used smokeless tobacco. He reports current alcohol use. He reports that he does not use drugs.  Allergies  Allergen Reactions  . Short Ragweed Pollen Ext Cough    Family History  Problem Relation Age of Onset  . CAD Father 96  . Asthma Father   . Alcohol abuse Father   . Arthritis Mother   . Alcohol abuse Sister     Prior to Admission medications   Medication Sig Start Date End Date Taking? Authorizing Provider  acetaminophen (TYLENOL) 500 MG tablet Take 1,000 mg by mouth daily as needed for moderate pain.   Yes [provider]  alfuzosin (UROXATRAL) 10 MG 24 hr tablet Take 10 mg by mouth at bedtime.   Yes [provider]  atorvastatin (LIPITOR) 80 MG tablet Take 80 mg by mouth daily.   Yes [provider]  buPROPion (WELLBUTRIN XL) 300 MG 24 hr tablet TAKE 1 TABLET DAILY  WITH  BREAKFAST BY MOUTH. Patient taking differently: Take 300 mg by mouth daily.  05/04/18  Yes Plotnikov, Evie Lacks, MD  Calcium Citrate-Vitamin D (CALCIUM CITRATE + D PO) Take 1 tablet by mouth 2 (two) times daily.    Yes [provider]  Cholecalciferol (VITAMIN D-3 PO) Take 2,000 Units by mouth 2 (two) times daily.    Yes [provider]  clonazePAM (KLONOPIN) 1 MG tablet Take 1 tablet (1 mg total) by mouth every 6 (six) hours as needed for anxiety. TAKE 1 TABLET BY MOUTH  EVERY 6 HOURS prn 06/25/18  Yes Plotnikov, Evie Lacks, MD  denosumab (PROLIA) 60 MG/ML SOLN injection Inject 60 mg into the skin every 6 (six) months. Administer in upper arm, thigh, or abdomen   Yes [provider]  diltiazem (CARDIZEM CD) 240 MG 24 hr capsule TAKE 1 CAPSULE BY MOUTH  DAILY 06/13/18  Yes Plotnikov, Evie Lacks, MD  doxycycline (VIBRA-TABS) 100 MG tablet TAKE 1 TABLET BY MOUTH  DAILY 06/25/18  Yes Plotnikov, Evie Lacks, MD  DULoxetine (CYMBALTA) 60 MG capsule TAKE 1 CAPSULE BY MOUTH  TWICE DAILY 06/25/18  Yes Plotnikov, Evie Lacks, MD  ezetimibe (ZETIA) 10 MG tablet Take 1 tablet (10 mg total) by mouth daily. 08/04/18 11/08/18 Yes Skeet Latch, MD  ferrous sulfate 325 (65 FE) MG tablet Take 325 mg by mouth 2 (two) times daily with a meal.   Yes [provider]  HYDROcodone-acetaminophen (NORCO) 7.5-325 MG tablet Take 1 tablet by mouth 4 (four) times daily as needed for moderate pain. Please fill on 11/02/18 09/06/18  Yes Plotnikov, Evie Lacks, MD  levothyroxine (SYNTHROID, LEVOTHROID) 200 MCG tablet TAKE 1 TABLET DAILY AT 6 AM BY MOUTH. Patient taking differently: Take 225 mcg by mouth daily before breakfast.  05/04/18  Yes Plotnikov, Evie Lacks, MD  levothyroxine (SYNTHROID, LEVOTHROID) 25 MCG tablet TAKE 1 TABLET BY MOUTH  DAILY BEFORE BREAKFAST. Patient taking differently: Take 225 mcg by mouth daily before breakfast.  05/04/18  Yes Plotnikov, Evie Lacks, MD  loratadine (CLARITIN) 10  MG tablet Take 10 mg by mouth daily.    Yes [provider]  Magnesium 500 MG CAPS Take 1 capsule (500 mg total) by mouth daily. 10/13/18  Yes Brunetta Genera, MD  metoprolol tartrate (LOPRESSOR) 25  MG tablet Take 1 tablet (25 mg total) by mouth 2 (two) times daily. 10/23/18 01/21/19 Yes Skeet Latch, MD  mirabegron ER (MYRBETRIQ) 50 MG TB24 tablet Take 50 mg by mouth at bedtime.    Yes [provider]  Multiple Vitamin (MULTIVITAMIN WITH MINERALS) TABS tablet Take 1 tablet by mouth daily. Men's One-A-Day 50+   Yes [provider]  omeprazole (PRILOSEC) 40 MG capsule TAKE 1 CAPSULE DAILY BEFORE BREAKFAST BY MOUTH. Patient taking differently: Take 40 mg by mouth daily.  05/04/18  Yes Plotnikov, Evie Lacks, MD  PROAIR HFA 108 [phone] Base) MCG/ACT inhaler Inhale 1 puff into the lungs every 4 (four) hours as needed. 04/21/18  Yes [provider]  Probiotic Product (PROBIOTIC PO) Take 1 capsule by mouth daily with breakfast.   Yes [provider]  testosterone enanthate (DELATESTRYL) 200 MG/ML injection INJECT 1ML (200MG  TOTAL) INTO THE MUSCLE EVERY 14 DAYS. FOR INTRAMUSCULAR USE ONLY. DISCARD VIAL AFTER 28 DAYS. Patient taking differently: Inject 100 mg into the muscle every 14 (fourteen) days.  08/03/18  Yes Plotnikov, Evie Lacks, MD  tiZANidine (ZANAFLEX) 4 MG tablet TAKE 1 TABLET BY MOUTH  EVERY 6 HOURS AS NEEDED FOR MUSCLE SPASM(S) Patient taking differently: Take 4 mg by mouth every 6 (six) hours as needed for muscle spasms.  08/31/18  Yes Plotnikov, Evie Lacks, MD  vitamin C (ASCORBIC ACID) 500 MG tablet Take 500 mg by mouth daily.   Yes [provider]  XARELTO 10 MG TABS tablet TAKE 1 TABLET DAILY BY  MOUTH. 03/20/18  Yes Plotnikov, Evie Lacks, MD  BELSOMRA 15 MG TABS Take 1 tablet by mouth daily. Patient not taking: Reported on 11/08/2018 10/06/18   Plotnikov, Evie Lacks, MD  EPINEPHrine (EPIPEN 2-PAK) 0.3 mg/0.3 mL IJ SOAJ injection Inject 0.3 mg  into the muscle daily as needed (for anaphylactic reaction).    [provider]  lidocaine-prilocaine (EMLA) cream Apply to affected area once Patient not taking: Reported on 11/08/2018 06/23/18   Brunetta Genera, MD  LORazepam (ATIVAN) 0.5 MG tablet Take 1 tablet (0.5 mg total) by mouth every 6 (six) hours as needed (Nausea or vomiting). Patient not taking: Reported on 11/08/2018 06/23/18   Brunetta Genera, MD  nitroGLYCERIN (NITROSTAT) 0.4 MG SL tablet Place 0.4 mg under the tongue every 5 (five) minutes as needed for chest pain. x3 doses as needed for chest pain    [provider]  ondansetron (ZOFRAN) 8 MG tablet Take 1 tablet (8 mg total) by mouth 2 (two) times daily as needed for refractory nausea / vomiting. chemotherapy. Patient not taking: Reported on 11/08/2018 06/23/18   Brunetta Genera, MD  predniSONE (DELTASONE) 20 MG tablet Take 3 tablets (60 mg total) by mouth daily. Take on days 1-5 of chemotherapy. Patient not taking: Reported on 11/08/2018 08/04/18   Brunetta Genera, MD  prochlorperazine (COMPAZINE) 10 MG tablet Take 1 tablet (10 mg total) by mouth every 6 (six) hours as needed (Nausea or vomiting). Patient not taking: Reported on 11/08/2018 06/23/18   Brunetta Genera, MD  triamcinolone ointment (KENALOG) 0.5 % APPLY TO AFFECTED AREA TWICE A DAY. Patient not taking: Reported on 11/08/2018 08/01/18   Brunetta Genera, MD    Physical Exam: Vitals:   11/08/18 2141 11/08/18 2215 11/08/18 2330 11/09/18 0100  BP: 131/80 (!) 145/79 (!) 158/98 (!) 160/94  Pulse: 79 75 91 (!) 50  Resp: (!) 22 20 20    Temp:   98.6 F (37  C)   TempSrc:   Oral   SpO2: 97% 97% 96% 94%  Weight:      Height:          Constitutional: Moderately built and nourished. Vitals:   11/08/18 2141 11/08/18 2215 11/08/18 2330 11/09/18 0100  BP: 131/80 (!) 145/79 (!) 158/98 (!) 160/94  Pulse: 79 75 91 (!) 50  Resp: (!) 22 20 20    Temp:   98.6 F (37 C)   TempSrc:    Oral   SpO2: 97% 97% 96% 94%  Weight:      Height:       Eyes: Anicteric no pallor. ENMT: No discharge from the ears eyes nose and mouth. Neck: No mass felt.  No neck rigidity no JVD appreciated. Respiratory: No rhonchi or crepitations. Cardiovascular: S1-S2 heard. Abdomen: Soft nontender bowel sounds present. Musculoskeletal: No edema.  No joint effusion. Skin: No rash. Neurologic: Alert awake oriented to time place and person.  Moves both upper extremities 5 x 5.  Lower extremity has difficulty moving from previous arthritis and also weakness. Psychiatric: Appears normal.  Normal affect.   Labs on Admission: I have personally reviewed following labs and imaging studies  CBC: Recent Labs  Lab 11/08/18 2043 11/08/18 2104  WBC 6.7  --   NEUTROABS  --  4.7  HGB 11.4*  --   HCT 36.1*  --   MCV 105.2*  --   PLT 173  --    Basic Metabolic Panel: Recent Labs  Lab 11/08/18 2043  NA 136  K 4.9  CL 101  CO2 27  GLUCOSE 100*  BUN 24*  CREATININE 1.25*  CALCIUM 9.3   GFR: Estimated Creatinine Clearance: 77.3 mL/min (A) (by C-G formula based on SCr of 1.25 mg/dL (H)). Liver Function Tests: No results for input(s): AST, ALT, ALKPHOS, BILITOT, PROT, ALBUMIN in the last 168 hours. No results for input(s): LIPASE, AMYLASE in the last 168 hours. No results for input(s): AMMONIA in the last 168 hours. Coagulation Profile: No results for input(s): INR, PROTIME in the last 168 hours. Cardiac Enzymes: Recent Labs  Lab 11/08/18 2104  TROPONINI 0.04*   BNP (last 3 results) No results for input(s): PROBNP in the last 8760 hours. HbA1C: No results for input(s): HGBA1C in the last 72 hours. CBG: Recent Labs  Lab 11/08/18 2100  GLUCAP 89   Lipid Profile: No results for input(s): CHOL, HDL, LDLCALC, TRIG, CHOLHDL, LDLDIRECT in the last 72 hours. Thyroid Function Tests: No results for input(s): TSH, T4TOTAL, FREET4, T3FREE, THYROIDAB in the last 72 hours. Anemia Panel: No  results for input(s): VITAMINB12, FOLATE, FERRITIN, TIBC, IRON, RETICCTPCT in the last 72 hours. Urine analysis:    Component Value Date/Time   COLORURINE YELLOW 11/08/2018 2043   APPEARANCEUR CLOUDY (A) 11/08/2018 2043   LABSPEC 1.016 11/08/2018 2043   PHURINE 6.0 11/08/2018 2043   GLUCOSEU NEGATIVE 11/08/2018 2043   GLUCOSEU NEGATIVE 10/02/2018 1235   HGBUR LARGE (A) 11/08/2018 2043   BILIRUBINUR NEGATIVE 11/08/2018 2043   [place] 11/08/2018 2043   PROTEINUR 100 (A) 11/08/2018 2043   UROBILINOGEN 0.2 10/02/2018 1235   NITRITE NEGATIVE 11/08/2018 2043   LEUKOCYTESUR LARGE (A) 11/08/2018 2043   Sepsis Labs: @LABRCNTIP (procalcitonin:4,lacticidven:4) )No results found for this or any previous visit (from the past 240 hour(s)).   Radiological Exams on Admission: Dg Chest 2 View  Result Date: 11/08/2018 CLINICAL DATA:  Weakness. EXAM: CHEST - 2 VIEW COMPARISON:  Radiographs of December 11, 2016. FINDINGS: The heart  size and mediastinal contours are within normal limits. Both lungs are clear. Interval placement of right internal jugular Port-A-Cath with distal tip in expected position of the SVC. The visualized skeletal structures are unremarkable. IMPRESSION: No active cardiopulmonary disease. Electronically Signed   By: Marijo Conception, M.D.   On: 11/08/2018 21:38   Ct Head Wo Contrast  Result Date: 11/08/2018 CLINICAL DATA:  Worsening generalized weakness. On treatment for lymphoma. EXAM: CT HEAD WITHOUT CONTRAST TECHNIQUE: Contiguous axial images were obtained from the base of the skull through the vertex without intravenous contrast. COMPARISON:  PET-CT August 22, 2018 FINDINGS: BRAIN: No intraparenchymal hemorrhage, mass effect nor midline shift. No parenchymal brain volume loss for age. No hydrocephalus. Patchy supratentorial white matter hypodensities within normal range for patient's age, though non-specific are most compatible with chronic small vessel ischemic disease. No  acute large vascular territory infarcts. No abnormal extra-axial fluid collections. Basal cisterns are patent. VASCULAR: Mild calcific atherosclerosis of the carotid siphons. SKULL: No skull fracture. 1 cm probable bone island occipital calvarium. No significant scalp soft tissue swelling. SINUSES/ORBITS: Trace paranasal sinus mucosal thickening. Mastoid air cells are well aerated.The included ocular globes and orbital contents are non-suspicious. Status post bilateral ocular lens implants. OTHER: None. IMPRESSION: Normal CT HEAD for age. Electronically Signed   By: Elon Alas M.D.   On: 11/08/2018 22:34    EKG: Independently reviewed.  A. fib.  Assessment/Plan Principal Problem:   Generalized weakness Active Problems:   Hypothyroidism   HTN (hypertension)   CAD (coronary artery disease)   PAF (paroxysmal atrial fibrillation) (HCC)    1. Generalized weakness mostly in the lower extremities which patient states used to be there previously which improved but started getting worse again after chemotherapy.  We will get physical therapy again.  Continue to monitor.  Denies any back pain no incontinence of urine at this time so no further imaging at this time is been ordered. 2. UTI on ceftriaxone.  Follow urine cultures. 3. Hypothyroidism on Synthroid.  Check TSH. 4. Paroxysmal atrial fibrillation on Cardizem metoprolol and Xarelto. 5. Hypertension on metoprolol and Cardizem. 6. Hyperlipidemia on statins.  Check CK levels. 7. Anemia could be from chemotherapy follow CBC. 8. Sleep apnea on CPAP. 9. Lymphoma being followed by Dr. Irene Limbo. 10. Elevated troponin denies any chest pain. 11. History of BPH.   DVT prophylaxis: Xarelto. Code Status: Full code. Family Communication: Discussed with patient. Disposition Plan: To be determined. Consults called: Physical therapy. Admission status: Observation.   Rise Patience MD [facility] Pager [phone].  If 7PM-7AM,  please contact night-coverage [email or web address] Password [email or web address]  11/09/2018, 3:17 AM

## 2018-11-09 NOTE — Clinical Social Work Note (Signed)
Clinical Social Work Assessment  Patient Details  Name: [name] MRN: [id number] Date of Birth: [birth year]  Date of referral:  11/09/18               Reason for consult:  Facility Placement, Discharge Planning                Permission sought to share information with:  Family Supports Permission granted to share information::  Yes, Verbal Permission Granted  Name::     Rush Farmer  Agency::     Relationship::  Spouse  Contact Information:  [phone]  Housing/Transportation Living arrangements for the past 2 months:  [place] of Information:  Patient Patient Interpreter Needed:  None Criminal Activity/Legal Involvement Pertinent to Current Situation/Hospitalization:  No - Comment as needed Significant Relationships:  Church, Industrial/product designer, [employer], Friend, Spouse Lives with:  Self, Spouse Do you feel safe going back to the place where you live?    Need for family participation in patient care:     Care giving concerns:  CSW consulted as patient is from home but has had difficulty ambulating since he finished chemotherapy a week ago.    Social Worker assessment / plan:  CSW spoke with patient at bedside at the request of Hospitalist as patient is likely needing SNF placement. Patient reported he completed his chemotherapy a week ago and that his last treatment was the worst one he had. Per patient, he had been getting Outpatient PT and had noticed an improvement until his last treatment. Patient reported he and his spouse had returned from church and he tried to climb the stairs but was unable to and required a 3 man assist and was brought to hospital. Patient stated his home is a 2-story home and that he needs to be able to get up the stairs. Patient reports he has a chair lift for in the home but does not for the 5 brick stairs outside.  Patient reported he has been to many SNFs in the past and would like to go to one to get strong enough to return  home.   Employment status:  Retired Nurse, adult PT Recommendations:  Not assessed at this time [place] / Referral to community resources:  [place]  Patient/Family's Response to care:  Patient engaged throughout conversation and reported he wants to get stronger so he can return home. Patient agreeable to go to SNF.   Patient/Family's Understanding of and Emotional Response to Diagnosis, Current Treatment, and Prognosis:  Patient stated he is ultimately wanting to go home once he is strong enough. Patient stated he is scared because he has had so many diagnoses but is optimistic he will be strong enough to return home.   Emotional Assessment Appearance:  Appears stated age Attitude/Demeanor/Rapport:  Engaged, Gracious Affect (typically observed):  Accepting, Calm, Pleasant, Anxious, Appropriate Orientation:  Oriented to Self, Oriented to Place, Oriented to  Time, Oriented to Situation Alcohol / Substance use:    Psych involvement (Current and /or in the community):  No (Comment)  Discharge Needs  Concerns to be addressed:  Home Safety Concerns Readmission within the last 30 days:  No Current discharge risk:  None Barriers to Discharge:  [place], [place] 11/09/2018, 2:17 PM

## 2018-11-09 NOTE — Progress Notes (Signed)
Pt decline the use of CPAP this evening.  RT will continue to monitor pt and will place if needed or requested.

## 2018-11-10 ENCOUNTER — Telehealth: Payer: Self-pay | Admitting: *Deleted

## 2018-11-10 DIAGNOSIS — R531 Weakness: Secondary | ICD-10-CM | POA: Diagnosis not present

## 2018-11-10 NOTE — Progress Notes (Signed)
Pt. Declines cpap, states he does not wear at home, advised ordered while inpatient, refuses at this time. Pt. Verbalizes understanding r/g purpose. Neomia Dear RN

## 2018-11-10 NOTE — Progress Notes (Signed)
PROGRESS NOTE                                                                                                                                                                                                             Patient Demographics:    [name], is a 73 y.o. male, DOB - 10/25/1945, YIF:[id number]  Admit date - 11/08/2018   Admitting Physician Rise Patience, MD  Outpatient Primary MD for the patient is Plotnikov, Evie Lacks, MD  LOS - 0   Chief Complaint  Patient presents with  . Weakness       Brief Narrative    This is a no charge note, as patient admitted earlier today, chart, imaging and labs were reviewed  HPI: [name] is a 73 y.o. male with history of lymphoma presently on chemotherapy last 1 week ago, atrial fibrillation on Xarelto, sleep apnea, anxiety depression, CAD presents to the ER with complaints of increasing weakness and difficulty ambulating   Subjective:    [name] today has, No headache, No chest pain, No abdominal pain - No Nausea, patient reports he is generally weak  Assessment  & Plan :    Principal Problem:   Generalized weakness Active Problems:   Hypothyroidism   HTN (hypertension)   CAD (coronary artery disease)   PAF (paroxysmal atrial fibrillation) (HCC)  Generalized weakness mostly in the lower extremities which patient states used to be there previously which improved but started getting worse again after chemotherapy.  Physical therapy consulted, will need SNF placement, social worker involved  UTI on ceftriaxone.  Urine culture growing Proteus mirabilis, awaiting sensitivity  Hypothyroidism on Synthroid.  Check TSH.  Paroxysmal atrial fibrillation on Cardizem metoprolol and Xarelto.  Hypertension on metoprolol and Cardizem.  Hyperlipidemia on statins.  Check CK levels.  Anemia could be from chemotherapy follow CBC.  Sleep apnea on CPAP.  Lymphoma being followed  by Dr. Irene Limbo.  Elevated troponin denies any chest pain.  History of BPH.    Code Status : Full  Family Communication  : None at bedside  Disposition Plan  : will need SNF   Consults  :  None  Procedures  : None  DVT Prophylaxis  :  ON XARELTO  Lab Results  Component Value Date   PLT 166 11/09/2018    Antibiotics  :  Anti-infectives (From admission, onward)   Start     Dose/Rate Route Frequency Ordered Stop   11/09/18 1000  doxycycline (VIBRA-TABS) tablet 100 mg     100 mg Oral Daily 11/09/18 0316     11/08/18 2345  cefTRIAXone (ROCEPHIN) 1 g in sodium chloride 0.9 % 100 mL IVPB     1 g 200 mL/hr over 30 Minutes Intravenous  Once 11/08/18 2338 11/09/18 0339        Objective:   Vitals:   11/09/18 2013 11/10/18 0504 11/10/18 0832 11/10/18 1329  BP: (!) 161/82 (!) 145/73 139/78 (!) 149/79  Pulse: 84 69 70 84  Resp: (!) 22 20 20 20   Temp: 97.6 F (36.4 C) (!) 97.3 F (36.3 C) (!) 97.3 F (36.3 C) 97.6 F (36.4 C)  TempSrc: Oral Oral Oral Oral  SpO2: 100% 98% 96% 95%  Weight:  134.4 kg    Height:        Wt Readings from Last 3 Encounters:  11/10/18 134.4 kg  10/12/18 (!) 139.1 kg  10/02/18 (!) 137 kg     Intake/Output Summary (Last 24 hours) at 11/10/2018 1354 Last data filed at 11/10/2018 1043 Gross per 24 hour  Intake 480 ml  Output 1950 ml  Net -1470 ml     Physical Exam  Awake Alert, Oriented X 3, No new F.N deficits, Normal affect Symmetrical Chest wall movement, Good air movement bilaterally, CTAB RRR,No Gallops,Rubs or new Murmurs, No Parasternal Heave +ve B.Sounds, Abd Soft, No tenderness, No rebound - guarding or rigidity. No Cyanosis, Clubbing or edema, No new Rash or bruise       Data Review:    CBC Recent Labs  Lab 11/08/18 2043 11/08/18 2104 11/09/18 0500  WBC 6.7  --  6.4  HGB 11.4*  --  10.9*  HCT 36.1*  --  34.6*  PLT 173  --  166  MCV 105.2*  --  105.2*  MCH 33.2  --  33.1  MCHC 31.6  --  31.5  RDW 15.1  --   14.9  LYMPHSABS  --  0.9  --   MONOABS  --  0.7  --   EOSABS  --  0.3  --   BASOSABS  --  0.0  --     Chemistries  Recent Labs  Lab 11/08/18 2043 11/09/18 0500  NA 136 136  K 4.9 3.5  CL 101 102  CO2 27 26  GLUCOSE 100* 102*  BUN 24* 23  CREATININE 1.25* 1.04  CALCIUM 9.3 8.7*   ------------------------------------------------------------------------------------------------------------------ No results for input(s): CHOL, HDL, LDLCALC, TRIG, CHOLHDL, LDLDIRECT in the last 72 hours.  Lab Results  Component Value Date   HGBA1C 5.8 (H) 02/06/2013   ------------------------------------------------------------------------------------------------------------------ Recent Labs    11/09/18 0425  TSH 0.130*   ------------------------------------------------------------------------------------------------------------------ No results for input(s): VITAMINB12, FOLATE, FERRITIN, TIBC, IRON, RETICCTPCT in the last 72 hours.  Coagulation profile No results for input(s): INR, PROTIME in the last 168 hours.  No results for input(s): DDIMER in the last 72 hours.  Cardiac Enzymes Recent Labs  Lab 11/09/18 0424 11/09/18 0917 11/09/18 1459  TROPONINI 0.04* 0.04* 0.03*   ------------------------------------------------------------------------------------------------------------------    Component Value Date/Time   BNP 94.5 12/11/2016 1532    Inpatient Medications  Scheduled Meds: . alfuzosin  10 mg Oral QHS  . atorvastatin  80 mg Oral Daily  . buPROPion  300 mg Oral Daily  . diltiazem  240 mg Oral Daily  . doxycycline  100 mg  Oral Daily  . DULoxetine  60 mg Oral BID  . ezetimibe  10 mg Oral Daily  . ferrous sulfate  325 mg Oral BID WC  . levothyroxine  225 mcg Oral Q0600  . loratadine  10 mg Oral Daily  . magnesium oxide  400 mg Oral Daily  . metoprolol tartrate  25 mg Oral BID  . mirabegron ER  50 mg Oral QHS  . multivitamin with minerals  1 tablet Oral Daily  .  pantoprazole  40 mg Oral Daily  . rivaroxaban  10 mg Oral Daily  . sodium chloride flush  3 mL Intravenous Once  . vitamin C  500 mg Oral Daily   Continuous Infusions: PRN Meds:.acetaminophen **OR** acetaminophen, albuterol, clonazePAM, HYDROcodone-acetaminophen, nitroGLYCERIN, ondansetron **OR** ondansetron (ZOFRAN) IV, tiZANidine  Micro Results Recent Results (from the past 240 hour(s))  Urine culture     Status: Abnormal (Preliminary result)   Collection Time: 11/08/18  9:04 PM  Result Value Ref Range Status   Specimen Description   Final    URINE, RANDOM Performed at [facility] [address] [place], [place] [address]    Special Requests   Final    NONE Performed at [facility], [place] [address], [place], [place] [address]    Culture 20,000 COLONIES/mL PROTEUS MIRABILIS (A)  Final   Report Status PENDING  Incomplete    Radiology Reports Dg Chest 2 View  Result Date: 11/08/2018 CLINICAL DATA:  Weakness. EXAM: CHEST - 2 VIEW COMPARISON:  Radiographs of December 11, 2016. FINDINGS: The heart size and mediastinal contours are within normal limits. Both lungs are clear. Interval placement of right internal jugular Port-A-Cath with distal tip in expected position of the SVC. The visualized skeletal structures are unremarkable. IMPRESSION: No active cardiopulmonary disease. Electronically Signed   By: Marijo Conception, M.D.   On: 11/08/2018 21:38   Ct Head Wo Contrast  Result Date: 11/08/2018 CLINICAL DATA:  Worsening generalized weakness. On treatment for lymphoma. EXAM: CT HEAD WITHOUT CONTRAST TECHNIQUE: Contiguous axial images were obtained from the base of the skull through the vertex without intravenous contrast. COMPARISON:  PET-CT August 22, 2018 FINDINGS: BRAIN: No intraparenchymal hemorrhage, mass effect nor midline shift. No parenchymal brain volume loss for age. No hydrocephalus. Patchy supratentorial white matter hypodensities within  normal range for patient's age, though non-specific are most compatible with chronic small vessel ischemic disease. No acute large vascular territory infarcts. No abnormal extra-axial fluid collections. Basal cisterns are patent. VASCULAR: Mild calcific atherosclerosis of the carotid siphons. SKULL: No skull fracture. 1 cm probable bone island occipital calvarium. No significant scalp soft tissue swelling. SINUSES/ORBITS: Trace paranasal sinus mucosal thickening. Mastoid air cells are well aerated.The included ocular globes and orbital contents are non-suspicious. Status post bilateral ocular lens implants. OTHER: None. IMPRESSION: Normal CT HEAD for age. Electronically Signed   By: Elon Alas M.D.   On: 11/08/2018 22:34     Phillips Climes M.D on 11/10/2018 at 1:54 PM  Between 7am to 7pm - Pager - [phone]  After 7pm go to [email or web address] - password [facility]  [facility] -  Office  [phone]

## 2018-11-10 NOTE — Progress Notes (Signed)
Patient awaiting auth.   Weekend LCSW will follow up.   [name] [place]

## 2018-11-10 NOTE — Telephone Encounter (Signed)
Husband called - patient has been in hospital following fall. Need to reschedule appt for 2/6. Also rescheduling PET originally scheduled for Monday. Advised him that scheduling message will be sent to set up Lab/MD appt for 2 weeks from 2/6. He verbalized understanding.

## 2018-11-10 NOTE — Progress Notes (Signed)
Patient chose bed at [facility]. Facility started [employer].  Patient awaiting insurance auth.  LCSW will continue to follow.   Carolin Coy [place]

## 2018-11-11 DIAGNOSIS — R531 Weakness: Secondary | ICD-10-CM | POA: Diagnosis not present

## 2018-11-11 LAB — URINE CULTURE: Culture: 20000 — AB

## 2018-11-11 MED ORDER — CLONAZEPAM 1 MG PO TABS: 1.0000 mg | ORAL_TABLET | Freq: Three times a day (TID) | ORAL | 1 refills | Status: DC | PRN

## 2018-11-11 MED ORDER — ONDANSETRON HCL 4 MG PO TABS: 4.0000 mg | ORAL_TABLET | Freq: Four times a day (QID) | ORAL | 0 refills | Status: DC | PRN

## 2018-11-11 MED ORDER — ALBUTEROL SULFATE (2.5 MG/3ML) 0.083% IN NEBU: 2.5000 mg | INHALATION_SOLUTION | RESPIRATORY_TRACT | 12 refills | Status: DC | PRN

## 2018-11-11 MED ORDER — HYDROCODONE-ACETAMINOPHEN 7.5-325 MG PO TABS: 1.0000 | ORAL_TABLET | Freq: Four times a day (QID) | ORAL | 0 refills | Status: DC | PRN

## 2018-11-11 NOTE — Clinical Social Work Placement (Addendum)
Patient discharging to [facility] room 908P. Confirmed bed, insurance auth, and faxed required docs. Patient and husband, [name], aware of plan. Patient will transport via husband.  RN call report: [phone]  CLINICAL SOCIAL WORK PLACEMENT  NOTE  Date:  11/11/2018  Patient Details  Name: [name] MRN: [id number] Date of Birth: 06/25/1946  Clinical Social Work is seeking post-discharge placement for this patient at the [place] level of care (*CSW will initial, date and re-position this form in  chart as items are completed):  Yes   Patient/family provided with [place] Work Department's list of facilities offering this level of care within the geographic area requested by the patient (or if unable, by the patient's family).  Yes   Patient/family informed of their freedom to choose among providers that offer the needed level of care, that participate in Medicare, Medicaid or managed care program needed by the patient, have an available bed and are willing to accept the patient.      Patient/family informed of [facility]'s ownership interest in [facility] and [facility], as well as of the fact that they are under no obligation to receive care at these facilities.  PASRR submitted to EDS on       PASRR number received on       Existing PASRR number confirmed on 11/09/18     FL2 transmitted to all facilities in geographic area requested by pt/family on       FL2 transmitted to all facilities within larger geographic area on 11/10/18     Patient informed that his/her managed care company has contracts with or will negotiate with certain facilities, including the following:        Yes   Patient/family informed of bed offers received.  Patient chooses bed at [facility]     Physician recommends and patient chooses bed at      Patient to be transferred to [facility] on 11/11/18.  Patient to be transferred to  facility by Family     Patient family notified on 11/11/18 of transfer.  Name of family member notified:  [name]     PHYSICIAN Please prepare priority discharge summary, including medications     Additional Comment:    _______________________________________________ Pricilla Holm, [place] 11/11/2018, 11:34 AM

## 2018-11-11 NOTE — Progress Notes (Signed)
Pt leaving this afternoon with his spouse, headed to [facility] for rehab. Pt alert, oriented, and without c/o. Discharge instructions placed in Discharge Packet for [facility]. Pt and his spouse will deliver on pt arrival to [facility].

## 2018-11-11 NOTE — Discharge Summary (Signed)
[name], is a 73 y.o. male  DOB [birth year]  MRN [id number].  Admission date:  11/08/2018  Admitting Physician  Rise Patience, MD  Discharge Date:  11/11/2018   Primary MD  Plotnikov, Evie Lacks, MD  Recommendations for primary care physician for things to follow:  - please check CBC, BMP during next visit   Admission Diagnosis  Generalized weakness [R53.1] Urinary tract infection with hematuria, site unspecified [N39.0, R31.9] Weakness [R53.1]   Discharge Diagnosis  Generalized weakness [R53.1] Urinary tract infection with hematuria, site unspecified [N39.0, R31.9] Weakness [R53.1]    Principal Problem:   Generalized weakness Active Problems:   Hypothyroidism   HTN (hypertension)   CAD (coronary artery disease)   PAF (paroxysmal atrial fibrillation) (HCC)      Past Medical History:  Diagnosis Date  . A-fib (HCC)    hx.- sinus rhythm now  . Acid reflux   . AKI (acute kidney injury) (HCC)    mild  . Anxiety   . Arthritis   . Atrial dilatation, bilateral    Severly, enlargement  per ECHO 11/03/16  . BPH (benign prostatic hyperplasia)   . CAD (coronary artery disease)   . Cancer ([place])   . Depression   . Dislocation of internal right hip prosthesis, sequela   . Dysrhythmia    hx a fib  . Elevated brain natriuretic peptide (BNP) level   . Environmental allergies   . Fracture of wrist 10/23/13   LEFT  . High cholesterol   . History of Graves' disease   . History of kidney stones    surg. removal 02/2017  . History of syncope   . HLD (hyperlipidemia)   . Hypertension   . Hypothyroidism    thyroid removed  . Left knee injury    cap dislocated  . Low testosterone, possible hypogonadism 10/01/2012  . Lumbago   . Metabolic bone disease   . Multiple falls   . Osteoporosis    Severe  . Other abnormalities of gait and mobility   . Plaque psoriasis   . Sleep apnea    cpap machine-settings 17  . Thrombocytopenia ([place])   . Thyroid disease    HX GRAVES DISEASE  . Transfusion history    s/p 12'13 hip surgery  . Unsteady gait   . UTI (urinary tract infection)   . Vertebral compression fracture (HCC)    L1-wears brace    Past Surgical History:  Procedure Laterality Date  . APPENDECTOMY    . CARDIAC CATHETERIZATION  2013  . CATARACT EXTRACTION, BILATERAL    . COLONOSCOPY    . CYSTOSCOPY WITH RETROGRADE PYELOGRAM, URETEROSCOPY AND STENT PLACEMENT Left 11/07/2017   Procedure: CYSTOSCOPY WITH RETROGRADE PYELOGRAM, URETEROSCOPY AND STENT PLACEMENT;  Surgeon: Cleon Gustin, MD;  Location: [facility];  Service: Urology;  Laterality: Left;  . CYSTOSCOPY/URETEROSCOPY/HOLMIUM LASER/STENT PLACEMENT Right 03/11/2017   Procedure: CYSTOSCOPY/URETEROSCOPYSTENT PLACEMENT right ureter retrograde pylegram;  Surgeon: Cleon Gustin, MD;  Location: [facility];  Service: Urology;  Laterality: Right;  .  HARDWARE REMOVAL  10/05/2012   Procedure: HARDWARE REMOVAL;  Surgeon: [name], MD;  Location[facility];  Service: Orthopedics;  Laterality: Right;  REMOVING  STRYKER  GAMMA NAIL  . HARDWARE REMOVAL Right 07/03/2013   Procedure: HARDWARE REMOVAL RIGHT TIBIA ;  Surgeon: Rozanna Box, MD;  Location: [facility];  Service: Orthopedics;  Laterality: Right;  . HIP CLOSED REDUCTION Right 10/15/2013   Procedure: CLOSED MANIPULATION HIP;  Surgeon: [name], MD;  [facility];  Service: Orthopedics;  Laterality: Right;  . hip revision     June 2017  . HOLMIUM LASER APPLICATION Right 11/17/9483   Procedure: HOLMIUM LASER APPLICATION;  Surgeon: Cleon Gustin, MD;  [facility];  Service: Urology;  Laterality: Right;  . INCISION AND DRAINAGE HIP Right 11/16/2013   Procedure: IRRIGATION AND DEBRIDEMENT RIGHT HIP;  Surgeon: [name], MD;  [facility];  Service: Orthopedics;  Laterality: Right;  . IR IMAGING GUIDED PORT INSERTION  06/22/2018  . IR URETERAL STENT  LEFT NEW ACCESS W/O SEP NEPHROSTOMY CATH  10/24/2017  . JOINT REPLACEMENT     hip-right x2  . KNEE SURGERY Bilateral 2012   Total knee replacements  . NEPHROLITHOTOMY Right 02/08/2017   Procedure: NEPHROLITHOTOMY PERCUTANEOUS WITH SURGEON ACCESS;  Surgeon: Cleon Gustin, MD;  [facility];  Service: Urology;  Laterality: Right;  . NEPHROLITHOTOMY Left 10/24/2017   Procedure: NEPHROLITHOTOMY PERCUTANEOUS;  Surgeon: Cleon Gustin, MD;  [facility];  Service: Urology;  Laterality: Left;  . ORIF TIBIA FRACTURE Right 02/06/2013   Procedure: OPEN REDUCTION INTERNAL FIXATION (ORIF) TIBIA FRACTURE WITH IM ROD FIBULA;  Surgeon: Rozanna Box, MD;  Location: [place];  Service: Orthopedics;  Laterality: Right;  . ORIF TIBIA FRACTURE Right 07/03/2013   Procedure: RIGHT TIBIA NON UNION REPAIR ;  Surgeon: Rozanna Box, MD;  Location: [place];  Service: Orthopedics;  Laterality: Right;  . ORIF WRIST FRACTURE  10/02/2012   Procedure: OPEN REDUCTION INTERNAL FIXATION (ORIF) WRIST FRACTURE;  Surgeon: Roseanne Kaufman, MD;  [facility];  Service: Orthopedics;  Laterality: Right;  WITH   ANTIBIOTIC  CEMENT  . ORIF WRIST FRACTURE Left 10/28/2013   Procedure: OPEN REDUCTION INTERNAL FIXATION (ORIF) WRIST FRACTURE with allograft;  Surgeon: Roseanne Kaufman, MD;  [facility];  Service: Orthopedics;  Laterality: Left;  DVR Plate  . QUADRICEPS TENDON REPAIR Left 07/15/2017   Procedure: REPAIR QUADRICEP TENDON;  Surgeon: Frederik Pear, MD;  Location: [place];  Service: Orthopedics;  Laterality: Left;  . right femur surgery  05/2012  . THYROIDECTOMY  02/1986  . TOTAL HIP REVISION  10/05/2012   Procedure: TOTAL HIP REVISION;  Surgeon: [name], MD;  [facility];  Service: Orthopedics;  Laterality: Right;  RIGHT TOTAL HIP REVISION  . TOTAL HIP REVISION Right 09/17/2013   Procedure: REVISION RIGHT TOTAL HIP ARTHROPLASTY ;  Surgeon: [name], MD;  [facility];  Service: Orthopedics;   Laterality: Right;  . TOTAL HIP REVISION Right 10/26/2013   Procedure: REVISION RIGHT TOTAL HIP ARTHROPLASTY;  Surgeon: [name], MD;  [facility];  Service: Orthopedics;  Laterality: Right;  . TOTAL HIP REVISION  03/2016  . TOTAL KNEE REVISION Left 04/11/2017   Procedure: TOTAL KNEE REVISION PATELLA and TIBIA;  Surgeon: Frederik Pear, MD;  Location: [place] [facility];  Service: Orthopedics;  Laterality: Left;  . WRIST FRACTURE SURGERY  05/2012       History of present illness  and  Hospital Course:     Kindly see H&P for history of present illness and admission details, please review complete Labs, Consult reports and Test reports for all details in brief  HPI  from the history and physical done on the day of admission 11/09/2018  HPI: [name] is a 73 y.o. male with history of lymphoma presently on chemotherapy last 1 week ago, atrial fibrillation on Xarelto, sleep apnea, anxiety depression, CAD presents to the ER with complaints of increasing weakness and difficulty ambulating.  Patient states since patient has been started on chemo patient has become more weak.  Patient states he had a multiple surgeries on his right lower extremity and also few surgeries on the left lower extremity about 7 years ago following which patient has become very weak in the extremities but eventually has gained more strength but getting weak again after he was started on chemo.  Denies any incontinence of urine but has been having frequency and some foul-smelling urine recently.  Has had recurrent UTI previously.  Denies chest pain or shortness of breath.  Has some fatigue.  ED Course: In the ER patient UA is compatible with UTI.  CT head is unremarkable chest x-ray unremarkable.  EKG shows A. fib.  Troponin mildly elevated.  On trying to ambulate patient finds it very difficult to ambulate.  Recent physical therapy notes shows that patient has difficulty ambulating.  Patient admitted for further work-up of  weakness UTI mildly elevated troponin.  Hospital Course   [name] a 73 y.o.malewithhistory of lymphoma presently on chemotherapy last 1 week ago, atrial fibrillation on Xarelto, sleep apnea, anxiety depression, CAD presents to the ER with complaints of increasing weakness and difficulty ambulating  Generalized weakness mostly in the lower extremities which patient states used to be there previously which improved but started getting worse again after chemotherapy.  Physical therapy consulted, will need SNF placement, social worker involved  Abnormal urine analysis : Patient denies any GI symptoms, urine culture growing Proteus mirabilis, only 20,000 colonies, received IV Rocephin during hospital stay, no indication for antibiotics on discharge .  Hypothyroidism on Synthroid. Check TSH.  Paroxysmal atrial fibrillation on Cardizem metoprolol and Xarelto.  Hypertension on metoprolol and Cardizem.  Hyperlipidemia on statins. Check CK levels.  Anemia of chronic illness: could be from malignancy, chemotherapy   Lymphoma being followed by Dr. Irene Limbo.  Elevated troponin denies any chest pain.  And ACS pattern  History of BPH.     Discharge Condition:  stable   Follow UP  Contact information for after-discharge care    Destination    [facility]erred [facility] .   Service:  Skilled Nursing Contact information: [place] [phone]                Discharge Instructions  and  Discharge Medications    Discharge Instructions    Discharge instructions   Complete by:  As directed    Follow with Primary MD Plotnikov, Evie Lacks, MD in 7 days   Get CBC, CMP,  checked  by Primary MD next visit.    Activity: As tolerated with Full fall precautions use walker/cane & assistance as needed   Disposition SNF   Diet: Regular diet   On your next visit with your primary care physician please Get  Medicines reviewed and adjusted.   Please request your Prim.MD to go over all Hospital Tests and Procedure/Radiological results at the follow up, please  get all Hospital records sent to your Prim MD by signing hospital release before you go home.   If you experience worsening of your admission symptoms, develop shortness of breath, life threatening emergency, suicidal or homicidal thoughts you must seek medical attention immediately by calling 911 or calling your MD immediately  if symptoms less severe.  You Must read complete instructions/literature along with all the possible adverse reactions/side effects for all the Medicines you take and that have been prescribed to you. Take any new Medicines after you have completely understood and accpet all the possible adverse reactions/side effects.   Do not drive, operating heavy machinery, perform activities at heights, swimming or participation in water activities or provide baby sitting services if your were admitted for syncope or siezures until you have seen by Primary MD or a Neurologist and advised to do so again.  Do not drive when taking Pain medications.    Do not take more than prescribed Pain, Sleep and Anxiety Medications  Special Instructions: If you have smoked or chewed Tobacco  in the last 2 yrs please stop smoking, stop any regular Alcohol  and or any Recreational drug use.  Wear Seat belts while driving.   Please note  You were cared for by a hospitalist during your hospital stay. If you have any questions about your discharge medications or the care you received while you were in the hospital after you are discharged, you can call the unit and asked to speak with the hospitalist on call if the hospitalist that took care of you is not available. Once you are discharged, your primary care physician will handle any further medical issues. Please note that NO REFILLS for any discharge medications will be authorized once you are  discharged, as it is imperative that you return to your primary care physician (or establish a relationship with a primary care physician if you do not have one) for your aftercare needs so that they can reassess your need for medications and monitor your lab values.   Increase activity slowly   Complete by:  As directed      Allergies as of 11/11/2018      Reactions   Short Ragweed Pollen Ext Cough      Medication List    STOP taking these medications   BELSOMRA 15 MG Tabs Generic drug:  Suvorexant   denosumab 60 MG/ML Soln injection Commonly known as:  PROLIA   lidocaine-prilocaine cream Commonly known as:  EMLA   LORazepam 0.5 MG tablet Commonly known as:  ATIVAN   predniSONE 20 MG tablet Commonly known as:  DELTASONE   PROAIR HFA 108 (90 Base) MCG/ACT inhaler Generic drug:  albuterol Replaced by:  albuterol (2.5 MG/3ML) 0.083% nebulizer solution   testosterone enanthate 200 MG/ML injection Commonly known as:  DELATESTRYL   triamcinolone ointment 0.5 % Commonly known as:  KENALOG     TAKE these medications   acetaminophen 500 MG tablet Commonly known as:  TYLENOL Take 1,000 mg by mouth daily as needed for moderate pain.   albuterol (2.5 MG/3ML) 0.083% nebulizer solution Commonly known as:  PROVENTIL Take 3 mLs (2.5 mg total) by nebulization every 4 (four) hours as needed for wheezing or shortness of breath. Replaces:  PROAIR HFA 108 (90 Base) MCG/ACT inhaler   alfuzosin 10 MG 24 hr tablet Commonly known as:  UROXATRAL Take 10 mg by mouth at bedtime.   atorvastatin 80 MG tablet Commonly known as:  LIPITOR Take 80 mg by mouth  daily.   buPROPion 300 MG 24 hr tablet Commonly known as:  WELLBUTRIN XL TAKE 1 TABLET DAILY WITH  BREAKFAST BY MOUTH. What changed:  See the new instructions.   CALCIUM CITRATE + D PO Take 1 tablet by mouth 2 (two) times daily.   clonazePAM 1 MG tablet Commonly known as:  KLONOPIN Take 1 tablet (1 mg total) by mouth 3 (three)  times daily as needed for anxiety. TAKE 1 TABLET BY MOUTH  EVERY 6 HOURS prn What changed:  when to take this   diltiazem 240 MG 24 hr capsule Commonly known as:  CARDIZEM CD TAKE 1 CAPSULE BY MOUTH  DAILY   doxycycline 100 MG tablet Commonly known as:  VIBRA-TABS TAKE 1 TABLET BY MOUTH  DAILY   DULoxetine 60 MG capsule Commonly known as:  CYMBALTA TAKE 1 CAPSULE BY MOUTH  TWICE DAILY   EPIPEN 2-PAK 0.3 mg/0.3 mL Soaj injection Generic drug:  EPINEPHrine Inject 0.3 mg into the muscle daily as needed (for anaphylactic reaction).   ezetimibe 10 MG tablet Commonly known as:  ZETIA Take 1 tablet (10 mg total) by mouth daily.   ferrous sulfate 325 (65 FE) MG tablet Take 325 mg by mouth 2 (two) times daily with a meal.   HYDROcodone-acetaminophen 7.5-325 MG tablet Commonly known as:  NORCO Take 1 tablet by mouth 4 (four) times daily as needed for moderate pain. Please fill on 11/02/18   levothyroxine 25 MCG tablet Commonly known as:  SYNTHROID, LEVOTHROID TAKE 1 TABLET BY MOUTH  DAILY BEFORE BREAKFAST. What changed:  See the new instructions.   levothyroxine 200 MCG tablet Commonly known as:  SYNTHROID, LEVOTHROID TAKE 1 TABLET DAILY AT 6 AM BY MOUTH. What changed:  See the new instructions.   loratadine 10 MG tablet Commonly known as:  CLARITIN Take 10 mg by mouth daily.   Magnesium 500 MG Caps Take 1 capsule (500 mg total) by mouth daily.   metoprolol tartrate 25 MG tablet Commonly known as:  LOPRESSOR Take 1 tablet (25 mg total) by mouth 2 (two) times daily.   multivitamin with minerals Tabs tablet Take 1 tablet by mouth daily. Men's One-A-Day 50+   MYRBETRIQ 50 MG Tb24 tablet Generic drug:  mirabegron ER Take 50 mg by mouth at bedtime.   nitroGLYCERIN 0.4 MG SL tablet Commonly known as:  NITROSTAT Place 0.4 mg under the tongue every 5 (five) minutes as needed for chest pain. x3 doses as needed for chest pain   omeprazole 40 MG capsule Commonly known as:   PRILOSEC TAKE 1 CAPSULE DAILY BEFORE BREAKFAST BY MOUTH. What changed:  See the new instructions.   ondansetron 4 MG tablet Commonly known as:  ZOFRAN Take 1 tablet (4 mg total) by mouth every 6 (six) hours as needed for nausea. What changed:    medication strength  how much to take  when to take this  reasons to take this  additional instructions   PROBIOTIC PO Take 1 capsule by mouth daily with breakfast.   prochlorperazine 10 MG tablet Commonly known as:  COMPAZINE Take 1 tablet (10 mg total) by mouth every 6 (six) hours as needed (Nausea or vomiting).   tiZANidine 4 MG tablet Commonly known as:  ZANAFLEX TAKE 1 TABLET BY MOUTH  EVERY 6 HOURS AS NEEDED FOR MUSCLE SPASM(S) What changed:  See the new instructions.   vitamin C 500 MG tablet Commonly known as:  ASCORBIC ACID Take 500 mg by mouth daily.   VITAMIN D-3  PO Take 2,000 Units by mouth 2 (two) times daily.   XARELTO 10 MG Tabs tablet Generic drug:  rivaroxaban TAKE 1 TABLET DAILY BY  MOUTH.         Diet and Activity recommendation: See Discharge Instructions above   Consults obtained -  None   Major procedures and Radiology Reports - PLEASE review detailed and final reports for all details, in brief -      Dg Chest 2 View  Result Date: 11/08/2018 CLINICAL DATA:  Weakness. EXAM: CHEST - 2 VIEW COMPARISON:  Radiographs of December 11, 2016. FINDINGS: The heart size and mediastinal contours are within normal limits. Both lungs are clear. Interval placement of right internal jugular Port-A-Cath with distal tip in expected position of the SVC. The visualized skeletal structures are unremarkable. IMPRESSION: No active cardiopulmonary disease. Electronically Signed   By: Marijo Conception, M.D.   On: 11/08/2018 21:38   Ct Head Wo Contrast  Result Date: 11/08/2018 CLINICAL DATA:  Worsening generalized weakness. On treatment for lymphoma. EXAM: CT HEAD WITHOUT CONTRAST TECHNIQUE: Contiguous axial images were  obtained from the base of the skull through the vertex without intravenous contrast. COMPARISON:  PET-CT August 22, 2018 FINDINGS: BRAIN: No intraparenchymal hemorrhage, mass effect nor midline shift. No parenchymal brain volume loss for age. No hydrocephalus. Patchy supratentorial white matter hypodensities within normal range for patient's age, though non-specific are most compatible with chronic small vessel ischemic disease. No acute large vascular territory infarcts. No abnormal extra-axial fluid collections. Basal cisterns are patent. VASCULAR: Mild calcific atherosclerosis of the carotid siphons. SKULL: No skull fracture. 1 cm probable bone island occipital calvarium. No significant scalp soft tissue swelling. SINUSES/ORBITS: Trace paranasal sinus mucosal thickening. Mastoid air cells are well aerated.The included ocular globes and orbital contents are non-suspicious. Status post bilateral ocular lens implants. OTHER: None. IMPRESSION: Normal CT HEAD for age. Electronically Signed   By: Elon Alas M.D.   On: 11/08/2018 22:34    Micro Results    Recent Results (from the past 240 hour(s))  Urine culture     Status: Abnormal   Collection Time: 11/08/18  9:04 PM  Result Value Ref Range Status   Specimen Description   Final    URINE, RANDOM Performed at [facility] [address] [place],  [address]    Special Requests   Final    NONE Performed at [facility] [place] [address] [place], [place] [address]    Culture 20,000 COLONIES/mL PROTEUS MIRABILIS (A)  Final   Report Status 11/11/2018 FINAL  Final   Organism ID, Bacteria PROTEUS MIRABILIS (A)  Final      Susceptibility   Proteus mirabilis - MIC*    AMPICILLIN <=2 SENSITIVE Sensitive     CEFAZOLIN <=4 SENSITIVE Sensitive     CEFTRIAXONE <=1 SENSITIVE Sensitive     CIPROFLOXACIN >=4 RESISTANT Resistant     GENTAMICIN >=16 RESISTANT Resistant     IMIPENEM 2 SENSITIVE Sensitive      NITROFURANTOIN RESISTANT Resistant     TRIMETH/SULFA >=320 RESISTANT Resistant     AMPICILLIN/SULBACTAM <=2 SENSITIVE Sensitive     PIP/TAZO <=4 SENSITIVE Sensitive     * 20,000 COLONIES/mL PROTEUS MIRABILIS       Today   Subjective:   [name] today has no headache,no chest or abdominal pain,he reports generalized weakness.  Objective:   Blood pressure 136/69, pulse 69, temperature (!) 97.4 F (36.3 C), resp. rate 16, height 6\' 2"  (1.88  m), weight 134.3 kg, SpO2 94 %.   Intake/Output Summary (Last 24 hours) at 11/11/2018 1126 Last data filed at 11/11/2018 0819 Gross per 24 hour  Intake 840 ml  Output 1475 ml  Net -635 ml    Exam Awake Alert, Oriented x 3, No new F.N deficits, Normal affect Symmetrical Chest wall movement, Good air movement bilaterally, CTAB RRR,No Gallops,Rubs or new Murmurs, No Parasternal Heave +ve B.Sounds, Abd Soft, Non tender, , No rebound -guarding or rigidity. No Cyanosis, Clubbing or edema, No new Rash or bruise  Data Review   CBC w Diff:  Lab Results  Component Value Date   WBC 6.4 11/09/2018   HGB 10.9 (L) 11/09/2018   HGB 13.5 08/29/2018   HCT 34.6 (L) 11/09/2018   PLT 166 11/09/2018   PLT 181 08/29/2018   LYMPHOPCT 13 11/08/2018   MONOPCT 10 11/08/2018   EOSPCT 4 11/08/2018   BASOPCT 1 11/08/2018    CMP:  Lab Results  Component Value Date   NA 136 11/09/2018   NA 140 12/29/2017   K 3.5 11/09/2018   CL 102 11/09/2018   CO2 26 11/09/2018   BUN 23 11/09/2018   BUN 19 12/29/2017   CREATININE 1.04 11/09/2018   CREATININE 1.10 10/12/2018   GLU 98 03/23/2016   PROT 6.2 (L) 10/12/2018   PROT 7.0 12/29/2017   ALBUMIN 3.2 (L) 10/12/2018   ALBUMIN 3.9 12/29/2017   BILITOT 0.7 10/12/2018   ALKPHOS 52 10/12/2018   AST 20 10/12/2018   ALT 26 10/12/2018  .   Total Time in preparing paper work, data evaluation and todays exam - 91 minutes  Phillips Climes M.D on 11/11/2018 at 11:26 AM  [facility]   Office   [phone]

## 2018-11-11 NOTE — Progress Notes (Signed)
Writer has given report to receiving nurse at [facility], "Ramona", at this time.

## 2018-11-11 NOTE — Discharge Instructions (Signed)
Follow with Primary MD Plotnikov, Evie Lacks, MD in 7 days   Get CBC, CMP,  checked  by Primary MD next visit.    Activity: As tolerated with Full fall precautions use walker/cane & assistance as needed   Disposition SNF   Diet: Regular diet   On your next visit with your primary care physician please Get Medicines reviewed and adjusted.   Please request your Prim.MD to go over all Hospital Tests and Procedure/Radiological results at the follow up, please get all Hospital records sent to your Prim MD by signing hospital release before you go home.   If you experience worsening of your admission symptoms, develop shortness of breath, life threatening emergency, suicidal or homicidal thoughts you must seek medical attention immediately by calling 911 or calling your MD immediately  if symptoms less severe.  You Must read complete instructions/literature along with all the possible adverse reactions/side effects for all the Medicines you take and that have been prescribed to you. Take any new Medicines after you have completely understood and accpet all the possible adverse reactions/side effects.   Do not drive, operating heavy machinery, perform activities at heights, swimming or participation in water activities or provide baby sitting services if your were admitted for syncope or siezures until you have seen by Primary MD or a Neurologist and advised to do so again.  Do not drive when taking Pain medications.    Do not take more than prescribed Pain, Sleep and Anxiety Medications  Special Instructions: If you have smoked or chewed Tobacco  in the last 2 yrs please stop smoking, stop any regular Alcohol  and or any Recreational drug use.  Wear Seat belts while driving.   Please note  You were cared for by a hospitalist during your hospital stay. If you have any questions about your discharge medications or the care you received while you were in the hospital after you are  discharged, you can call the unit and asked to speak with the hospitalist on call if the hospitalist that took care of you is not available. Once you are discharged, your primary care physician will handle any further medical issues. Please note that NO REFILLS for any discharge medications will be authorized once you are discharged, as it is imperative that you return to your primary care physician (or establish a relationship with a primary care physician if you do not have one) for your aftercare needs so that they can reassess your need for medications and monitor your lab values.

## 2018-11-13 ENCOUNTER — Telehealth: Payer: Self-pay | Admitting: *Deleted

## 2018-11-13 ENCOUNTER — Encounter (HOSPITAL_COMMUNITY): Payer: Self-pay

## 2018-11-13 ENCOUNTER — Telehealth: Payer: Self-pay | Admitting: Hematology

## 2018-11-13 ENCOUNTER — Ambulatory Visit (HOSPITAL_COMMUNITY): Payer: Medicare Other

## 2018-11-13 NOTE — Telephone Encounter (Signed)
Will reschedule appt after spouse calls with date of PET

## 2018-11-13 NOTE — Telephone Encounter (Signed)
Pt was on TCM report admitted 11/08/18 for increasing weakness and difficulty ambulating. Pt has history of lymphoma, had chemotherapy last  week , atrial fibrillation on Xarelto, sleep apnea, anxiety depression, and CAD. Physical therapy consulted, will need SNF placement, social worker involved. Pt D/C to SNF 11/11/18, per summary will need to see PCP 1 week after discharge.Marland KitchenJohny Chess

## 2018-11-13 NOTE — Telephone Encounter (Signed)
R/s appt per sch message - left message for patient with new appt date and time

## 2018-11-14 ENCOUNTER — Ambulatory Visit: Payer: Medicare Other | Admitting: Physical Therapy

## 2018-11-16 ENCOUNTER — Other Ambulatory Visit: Payer: Medicare Other

## 2018-11-16 ENCOUNTER — Ambulatory Visit: Payer: Medicare Other | Admitting: Hematology

## 2018-11-17 ENCOUNTER — Encounter: Payer: Medicare Other | Admitting: Physical Therapy

## 2018-11-21 ENCOUNTER — Encounter: Payer: Medicare Other | Admitting: Physical Therapy

## 2018-11-21 DIAGNOSIS — C8590 Non-Hodgkin lymphoma, unspecified, unspecified site: Secondary | ICD-10-CM | POA: Insufficient documentation

## 2018-11-21 DIAGNOSIS — Z9221 Personal history of antineoplastic chemotherapy: Secondary | ICD-10-CM | POA: Insufficient documentation

## 2018-11-21 DIAGNOSIS — C859A Non-Hodgkin lymphoma, unspecified, in remission: Secondary | ICD-10-CM | POA: Insufficient documentation

## 2018-11-22 ENCOUNTER — Encounter (HOSPITAL_COMMUNITY)
Admission: RE | Admit: 2018-11-22 | Discharge: 2018-11-22 | Disposition: A | Discharge status: Home / Self Care | Payer: Medicare Other | Source: Ambulatory Visit | Attending: Hematology | Admitting: Hematology

## 2018-11-22 DIAGNOSIS — C8330 Diffuse large B-cell lymphoma, unspecified site: Secondary | ICD-10-CM

## 2018-11-22 LAB — GLUCOSE, CAPILLARY: Glucose-Capillary: 116 mg/dL — ABNORMAL HIGH (ref 70–99)

## 2018-11-22 MED ORDER — FLUDEOXYGLUCOSE F - 18 (FDG) INJECTION
14.8000 | Freq: Once | INTRAVENOUS | Status: AC | PRN
  Administered 2018-11-22: 14.8 via INTRAVENOUS

## 2018-11-23 ENCOUNTER — Encounter: Payer: Medicare Other | Admitting: Physical Therapy

## 2018-11-24 ENCOUNTER — Other Ambulatory Visit: Payer: Self-pay | Admitting: Internal Medicine

## 2018-11-28 ENCOUNTER — Encounter: Payer: Medicare Other | Admitting: Physical Therapy

## 2018-11-29 NOTE — Progress Notes (Signed)
HEMATOLOGY/ONCOLOGY CLINIC NOTE  Date of Service: 11/30/2018  Patient Care Team: Cassandria Anger, MD as PCP - General (Internal Medicine) Brunetta Genera, MD as Consulting Physician (Hematology) McKenzie, Candee Furbish, MD as Consulting Physician (Urology) Jackquline Denmark, MD as Consulting Physician (Gastroenterology)  CHIEF COMPLAINTS/PURPOSE OF CONSULTATION:  Follow Up for Diffuse Large B-Cell Lymphoma   HISTORY OF PRESENTING ILLNESS:   [name] is a wonderful 73 y.o. male who has been referred to [place] by Dr. Lew Dawes for evaluation and management of Diffuse Large B-Cell Lymphoma. He is accompanied today by his partner. The pt reports that he is doing well overall.   The pt reports that he presented to ENT Dr. Pollie Friar care after developing a cough and a new nodule in his mouth. He notes that the symptoms presented about a month ago and have not progressed or significantly worsened. He notes that he has frequent sinus drainage, and felt that this enlargement could initially be related to this. He denies any changes in his voice and difficulty breathing.   The pt notes that he is finishing three months of PT tomorrow after surgeries on his right leg and left knee. He notes that he takes blood thinners for Afib and uses his CPAP regularly now. The pt also receives testosterone injections, 200mg  every other week. The pt notes that he had several surgeries bilaterally to remove large kidney stones in the past. He notes that his thyroid replacement has been stable. His vertebral compression fracture was in 2013, after a fall in which he also broke his right femur and wrist. The pt also takes Prolia every 6 months for his osteoporosis. He has had 18 surgeries on his right leg.   He notes that his psoriasis has been stable and takes Enbrel twice a week, and has taken this for 5 years. He was on 10 years of Humara before this. He was on Methotrexate prior to [facility].   The pt  notes that he functions okay at home and is able to drive himself and cook for himself and feels that he can function independently with limitations on lifting heavier objects.   On review of systems, pt reports recent cough, left tonsil mass, stable energy levels, and denies fevers, chills, night sweats, unexpected weight loss, difficulty breathing, difficulty swallowing, noticing any other lumps or bumps, pain along the spine, recent infections, abdominal pains, difficulty urinating, leg swelling, and any other symptoms.  . On Social Hx the pt reports that he smoked cigarettes for many years until he quit 6 years ago, and drinks ETOH socially.    INTERVAL HISTORY   [name] is here for management and evaluation, after completing 6 planned cycles of R-CHOP treatment of his Diffuse Large B-Cell Lymphoma. The patient's last visit with [place] was on 10/12/18. He is accompanied today by his partner. The pt reports that he is doing well overall.   The pt reports that while walking up the stairs 3 weeks ago, he developed acute loss of strength in his lower extremities. He was then admitted to the hospital and notes that he was treated for a UTI. He was then with inpatient rehab for two weeks, and is now going to continue with outpatient PT. The pt denies any fevers or chills.  The pt notes that several years ago he developed a swelling in the right side of his neck, which decreased in size on its own.  Of note since the patient's last visit,  pt has had a PET/CT completed on 11/22/18 with results revealing The tonsillar mass has essentially resolved. The large presumed lymph node superficial to the right sternocleidomastoid is stable in size at 2.3 cm in short axis, with maximum SUV of 3.3 (formerly 2.4), representing Deauville 3 activity, previously Deauville 2. 2. Other imaging findings of potential clinical significance: Aortic Atherosclerosis and Emphysema. Coronary atherosclerosis. Bilateral  nonobstructive nephrolithiasis. Umbilical hernia contains adipose tissue. Chronic high activity posterior to the right hip implant along a region of bony irregularity and deformity probably reflecting chronic inflammation. L1 vertebra plana.  Lab results today (11/30/18) of CBC w/diff and CMP is as follows: all values are WNL except for MCV at 100.9, BUN at 33, Creatinine at 1.34, GFR at 52. 11/30/18 LDH is WNL at 192.  On review of systems, pt reports good energy levels, and denies fevers, chills, and any other symptoms.    MEDICAL HISTORY:  Past Medical History:  Diagnosis Date  . A-fib (HCC)    hx.- sinus rhythm now  . Acid reflux   . AKI (acute kidney injury) (HCC)    mild  . Anxiety   . Arthritis   . Atrial dilatation, bilateral    Severly, enlargement  per ECHO 11/03/16  . BPH (benign prostatic hyperplasia)   . CAD (coronary artery disease)   . Cancer ([place])   . Depression   . Dislocation of internal right hip prosthesis, sequela   . Dysrhythmia    hx a fib  . Elevated brain natriuretic peptide (BNP) level   . Environmental allergies   . Fracture of wrist 10/23/13   LEFT  . High cholesterol   . History of Graves' disease   . History of kidney stones    surg. removal 02/2017  . History of syncope   . HLD (hyperlipidemia)   . Hypertension   . Hypothyroidism    thyroid removed  . Left knee injury    cap dislocated  . Low testosterone, possible hypogonadism 10/01/2012  . Lumbago   . Metabolic bone disease   . Multiple falls   . Osteoporosis    Severe  . Other abnormalities of gait and mobility   . Plaque psoriasis   . Sleep apnea    cpap machine-settings 17  . Thrombocytopenia ([place])   . Thyroid disease    HX GRAVES DISEASE  . Transfusion history    s/p 12'13 hip surgery  . Unsteady gait   . UTI (urinary tract infection)   . Vertebral compression fracture (HCC)    L1-wears brace    SURGICAL HISTORY: Past Surgical History:  Procedure Laterality Date  .  APPENDECTOMY    . CARDIAC CATHETERIZATION  2013  . CATARACT EXTRACTION, BILATERAL    . COLONOSCOPY    . CYSTOSCOPY WITH RETROGRADE PYELOGRAM, URETEROSCOPY AND STENT PLACEMENT Left 11/07/2017   Procedure: CYSTOSCOPY WITH RETROGRADE PYELOGRAM, URETEROSCOPY AND STENT PLACEMENT;  Surgeon: Cleon Gustin, MD;  Location: [facility];  Service: Urology;  Laterality: Left;  . CYSTOSCOPY/URETEROSCOPY/HOLMIUM LASER/STENT PLACEMENT Right 03/11/2017   Procedure: CYSTOSCOPY/URETEROSCOPYSTENT PLACEMENT right ureter retrograde pylegram;  Surgeon: Cleon Gustin, MD;  Location: [facility];  Service: Urology;  Laterality: Right;  . HARDWARE REMOVAL  10/05/2012   Procedure: HARDWARE REMOVAL;  Surgeon: [name], MD;  Location: [facility];  Service: Orthopedics;  Laterality: Right;  REMOVING  STRYKER  GAMMA NAIL  . HARDWARE REMOVAL Right 07/03/2013   Procedure: HARDWARE REMOVAL RIGHT TIBIA ;  Surgeon: Rozanna Box, MD;  Location: [place];  Service: Orthopedics;  Laterality: Right;  . HIP CLOSED REDUCTION Right 10/15/2013   Procedure: CLOSED MANIPULATION HIP;  Surgeon: [name], MD;  Location: [facility];  Service: Orthopedics;  Laterality: Right;  . hip revision     June 2017  . HOLMIUM LASER APPLICATION Right 10/16/6061   Procedure: HOLMIUM LASER APPLICATION;  Surgeon: Cleon Gustin, MD;  [facility];  Service: Urology;  Laterality: Right;  . INCISION AND DRAINAGE HIP Right 11/16/2013   Procedure: IRRIGATION AND DEBRIDEMENT RIGHT HIP;  Surgeon: [name], MD;  [facility];  Service: Orthopedics;  Laterality: Right;  . IR IMAGING GUIDED PORT INSERTION  06/22/2018  . IR URETERAL STENT LEFT NEW ACCESS W/O SEP NEPHROSTOMY CATH  10/24/2017  . JOINT REPLACEMENT     hip-right x2  . KNEE SURGERY Bilateral 2012   Total knee replacements  . NEPHROLITHOTOMY Right 02/08/2017   Procedure: NEPHROLITHOTOMY PERCUTANEOUS WITH SURGEON ACCESS;  Surgeon: Cleon Gustin, MD;  [facility];  Service: Urology;   Laterality: Right;  . NEPHROLITHOTOMY Left 10/24/2017   Procedure: NEPHROLITHOTOMY PERCUTANEOUS;  Surgeon: Cleon Gustin, MD;  [facility];  Service: Urology;  Laterality: Left;  . ORIF TIBIA FRACTURE Right 02/06/2013   Procedure: OPEN REDUCTION INTERNAL FIXATION (ORIF) TIBIA FRACTURE WITH IM ROD FIBULA;  Surgeon: Rozanna Box, MD;  Location: [place];  Service: Orthopedics;  Laterality: Right;  . ORIF TIBIA FRACTURE Right 07/03/2013   Procedure: RIGHT TIBIA NON UNION REPAIR ;  Surgeon: Rozanna Box, MD;  Location: [place];  Service: Orthopedics;  Laterality: Right;  . ORIF WRIST FRACTURE  10/02/2012   Procedure: OPEN REDUCTION INTERNAL FIXATION (ORIF) WRIST FRACTURE;  Surgeon: Roseanne Kaufman, MD;  [facility];  Service: Orthopedics;  Laterality: Right;  WITH   ANTIBIOTIC  CEMENT  . ORIF WRIST FRACTURE Left 10/28/2013   Procedure: OPEN REDUCTION INTERNAL FIXATION (ORIF) WRIST FRACTURE with allograft;  Surgeon: Roseanne Kaufman, MD;  [facility];  Service: Orthopedics;  Laterality: Left;  DVR Plate  . QUADRICEPS TENDON REPAIR Left 07/15/2017   Procedure: REPAIR QUADRICEP TENDON;  Surgeon: Frederik Pear, MD;  Location: [place];  Service: Orthopedics;  Laterality: Left;  . right femur surgery  05/2012  . THYROIDECTOMY  02/1986  . TOTAL HIP REVISION  10/05/2012   Procedure: TOTAL HIP REVISION;  Surgeon: [name], MD;  [facility];  Service: Orthopedics;  Laterality: Right;  RIGHT TOTAL HIP REVISION  . TOTAL HIP REVISION Right 09/17/2013   Procedure: REVISION RIGHT TOTAL HIP ARTHROPLASTY ;  Surgeon: [name], MD;  [facility];  Service: Orthopedics;  Laterality: Right;  . TOTAL HIP REVISION Right 10/26/2013   Procedure: REVISION RIGHT TOTAL HIP ARTHROPLASTY;  Surgeon: [name], MD;  [facility];  Service: Orthopedics;  Laterality: Right;  . TOTAL HIP REVISION  03/2016  . TOTAL KNEE REVISION Left 04/11/2017   Procedure: TOTAL KNEE REVISION PATELLA and TIBIA;   Surgeon: Frederik Pear, MD;  Location: [place];  Service: Orthopedics;  Laterality: Left;  . WRIST FRACTURE SURGERY  05/2012    SOCIAL HISTORY: Social History   Socioeconomic History  . Marital status: Married    Spouse name: Not on file  . Number of children: Not on file  . Years of education: Not on file  . Highest education level: Not on file  Occupational History  . Not on file  Social Needs  . Emergency planning/management officer  strain: Not on file  . Food insecurity:    Worry: Not on file    Inability: Not on file  . Transportation needs:    Medical: Not on file    Non-medical: Not on file  Tobacco Use  . Smoking status: Former Smoker    Last attempt to quit: 06/05/2012    Years since quitting: 6.4  . Smokeless tobacco: Never Used  Substance and Sexual Activity  . Alcohol use: Yes    Comment: occasional-social  . Drug use: No  . Sexual activity: Yes  Lifestyle  . Physical activity:    Days per week: Not on file    Minutes per session: Not on file  . Stress: Not on file  Relationships  . Social connections:    Talks on phone: Not on file    Gets together: Not on file    Attends religious service: Not on file    Active member of club or organization: Not on file    Attends meetings of clubs or organizations: Not on file    Relationship status: Not on file  . Intimate partner violence:    Fear of current or ex partner: Not on file    Emotionally abused: Not on file    Physically abused: Not on file    Forced sexual activity: Not on file  Other Topics Concern  . Not on file  Social History Narrative   [place] 2.5 months, went home Feb 21st slipped and fell on back and developed.  Home PT/OT.  Just started outpatient physical therapy.  Friday night, misstepped.      FAMILY HISTORY: Family History  Problem Relation Age of Onset  . CAD Father 73  . Asthma Father   . Alcohol abuse Father   . Arthritis Mother   . Alcohol abuse Sister     ALLERGIES:  is allergic to short  ragweed pollen ext.  MEDICATIONS:  Current Outpatient Medications  Medication Sig Dispense Refill  . acetaminophen (TYLENOL) 500 MG tablet Take 1,000 mg by mouth daily as needed for moderate pain.    [name] albuterol (PROVENTIL) (2.5 MG/3ML) 0.083% nebulizer solution Take 3 mLs (2.5 mg total) by nebulization every 4 (four) hours as needed for wheezing or shortness of breath. 75 mL 12  . alfuzosin (UROXATRAL) 10 MG 24 hr tablet Take 10 mg by mouth at bedtime.    [name] atorvastatin (LIPITOR) 80 MG tablet Take 80 mg by mouth daily.    [name] buPROPion (WELLBUTRIN XL) 300 MG 24 hr tablet TAKE 1 TABLET DAILY WITH  BREAKFAST BY MOUTH. (Patient taking differently: Take 300 mg by mouth daily. ) 90 tablet 3  . Calcium Citrate-Vitamin D (CALCIUM CITRATE + D PO) Take 1 tablet by mouth 2 (two) times daily.     . Cholecalciferol (VITAMIN D-3 PO) Take 2,000 Units by mouth 2 (two) times daily.     . clonazePAM (KLONOPIN) 1 MG tablet Take 1 tablet (1 mg total) by mouth 3 (three) times daily as needed for anxiety. TAKE 1 TABLET BY MOUTH  EVERY 6 HOURS prn 5 tablet 1  . diltiazem (CARDIZEM CD) 240 MG 24 hr capsule TAKE 1 CAPSULE BY MOUTH  DAILY 90 capsule 3  . doxycycline (VIBRA-TABS) 100 MG tablet TAKE 1 TABLET BY MOUTH  DAILY 90 tablet 1  . DULoxetine (CYMBALTA) 60 MG capsule TAKE 1 CAPSULE BY MOUTH  TWICE DAILY 180 capsule 1  . EPINEPHrine (EPIPEN 2-PAK) 0.3 mg/0.3 mL IJ SOAJ injection Inject 0.3  mg into the muscle daily as needed (for anaphylactic reaction).    . ferrous sulfate 325 (65 FE) MG tablet Take 325 mg by mouth 2 (two) times daily with a meal.    . HYDROcodone-acetaminophen (NORCO) 7.5-325 MG tablet Take 1 tablet by mouth 4 (four) times daily as needed for moderate pain. Please fill on 11/02/18 6 tablet 0  . levothyroxine (SYNTHROID, LEVOTHROID) 200 MCG tablet TAKE 1 TABLET DAILY AT 6 AM BY MOUTH. (Patient taking differently: Take 225 mcg by mouth daily before breakfast. ) 90 tablet 3  . levothyroxine (SYNTHROID,  LEVOTHROID) 25 MCG tablet TAKE 1 TABLET BY MOUTH  DAILY BEFORE BREAKFAST. (Patient taking differently: Take 225 mcg by mouth daily before breakfast. ) 90 tablet 3  . loratadine (CLARITIN) 10 MG tablet Take 10 mg by mouth daily.     . Magnesium 500 MG CAPS Take 1 capsule (500 mg total) by mouth daily. 30 capsule 1  . metoprolol tartrate (LOPRESSOR) 25 MG tablet Take 1 tablet (25 mg total) by mouth 2 (two) times daily. 180 tablet 1  . mirabegron ER (MYRBETRIQ) 50 MG TB24 tablet Take 50 mg by mouth at bedtime.     . Multiple Vitamin (MULTIVITAMIN WITH MINERALS) TABS tablet Take 1 tablet by mouth daily. Men's One-A-Day 32+    . nitroGLYCERIN (NITROSTAT) 0.4 MG SL tablet Place 0.4 mg under the tongue every 5 (five) minutes as needed for chest pain. x3 doses as needed for chest pain    . omeprazole (PRILOSEC) 40 MG capsule TAKE 1 CAPSULE DAILY BEFORE BREAKFAST BY MOUTH. (Patient taking differently: Take 40 mg by mouth daily. ) 90 capsule 3  . Probiotic Product (PROBIOTIC PO) Take 1 capsule by mouth daily with breakfast.    . testosterone enanthate (DELATESTRYL) 200 MG/ML injection INJECT 1ML (200MG ) INTO THE MUSCLE EVERY 14 DAYS. DISCARD VIAL AFTER 28 DAYS. 5 mL 3  . tiZANidine (ZANAFLEX) 4 MG tablet TAKE 1 TABLET BY MOUTH  EVERY 6 HOURS AS NEEDED FOR MUSCLE SPASM(S) (Patient taking differently: Take 4 mg by mouth every 6 (six) hours as needed for muscle spasms. ) 360 tablet 1  . vitamin C (ASCORBIC ACID) 500 MG tablet Take 500 mg by mouth daily.    Alveda Reasons 10 MG TABS tablet TAKE 1 TABLET DAILY BY  MOUTH. 90 tablet 3  . ezetimibe (ZETIA) 10 MG tablet Take 1 tablet (10 mg total) by mouth daily. 90 tablet 3  . ondansetron (ZOFRAN) 4 MG tablet Take 1 tablet (4 mg total) by mouth every 6 (six) hours as needed for nausea. 20 tablet 0  . prochlorperazine (COMPAZINE) 10 MG tablet Take 1 tablet (10 mg total) by mouth every 6 (six) hours as needed (Nausea or vomiting). (Patient not taking: Reported on 11/08/2018)  30 tablet 6   No current facility-administered medications for this visit.     REVIEW OF SYSTEMS:    A 10+ POINT REVIEW OF SYSTEMS WAS OBTAINED including neurology, dermatology, psychiatry, cardiac, respiratory, lymph, extremities, GI, GU, Musculoskeletal, constitutional, breasts, reproductive, HEENT.  All pertinent positives are noted in the HPI.  All others are negative.   PHYSICAL EXAMINATION: ECOG PERFORMANCE STATUS: 2 - Symptomatic, <50% confined to bed  . Vitals:   11/30/18 1429  BP: 129/61  Pulse: 95  Resp: 18  Temp: 98 F (36.7 C)  SpO2: 100%   Filed Weights   11/30/18 1429  Weight: 294 lb 6.4 oz (133.5 kg)   .Body mass index is 37.8 kg/m.  GENERAL:alert, in no acute distress and comfortable SKIN: chronic venous stasis changes in BLE, psoriasis rashes on elbows EYES: conjunctiva are pink and non-injected, sclera anicteric OROPHARYNX: MMM, no exudates, no oropharyngeal erythema or ulceration NECK: supple, no JVD LYMPH:  no palpable lymphadenopathy in the cervical, axillary or inguinal regions LUNGS: clear to auscultation b/l with normal respiratory effort HEART: regular rate & rhythm ABDOMEN:  normoactive bowel sounds , non tender, not distended. No palpable hepatosplenomegaly.  Extremity: no pedal edema  PSYCH: alert & oriented x 3 with fluent speech NEURO: no focal motor/sensory deficits   LABORATORY DATA:  I have reviewed the data as listed  . CBC Latest Ref Rng & Units 11/30/2018 11/09/2018 11/08/2018  WBC 4.0 - 10.5 K/uL 4.9 6.4 6.7  Hemoglobin 13.0 - 17.0 g/dL 13.6 10.9(L) 11.4(L)  Hematocrit 39.0 - 52.0 % 42.7 34.6(L) 36.1(L)  Platelets 150 - 400 K/uL 151 166 173    . CMP Latest Ref Rng & Units 11/30/2018 11/09/2018 11/08/2018  Glucose 70 - 99 mg/dL 86 102(H) 100(H)  BUN 8 - 23 mg/dL 33(H) 23 24(H)  Creatinine 0.61 - 1.24 mg/dL 1.34(H) 1.04 1.25(H)  Sodium 135 - 145 mmol/L 139 136 136  Potassium 3.5 - 5.1 mmol/L 4.5 3.5 4.9  Chloride 98 - 111 mmol/L  106 102 101  CO2 22 - 32 mmol/L 24 26 27   Calcium 8.9 - 10.3 mg/dL 9.0 8.7(L) 9.3  Total Protein 6.5 - 8.1 g/dL 6.7 - -  Total Bilirubin 0.3 - 1.2 mg/dL 0.5 - -  Alkaline Phos 38 - 126 U/L 83 - -  AST 15 - 41 U/L 25 - -  ALT 0 - 44 U/L 34 - -   05/29/18 Left tonsil biopsy:   RADIOGRAPHIC STUDIES: I have personally reviewed the radiological images as listed and agreed with the findings in the report. Dg Chest 2 View  Result Date: 11/08/2018 CLINICAL DATA:  Weakness. EXAM: CHEST - 2 VIEW COMPARISON:  Radiographs of December 11, 2016. FINDINGS: The heart size and mediastinal contours are within normal limits. Both lungs are clear. Interval placement of right internal jugular Port-A-Cath with distal tip in expected position of the SVC. The visualized skeletal structures are unremarkable. IMPRESSION: No active cardiopulmonary disease. Electronically Signed   By: Marijo Conception, M.D.   On: 11/08/2018 21:38   Ct Head Wo Contrast  Result Date: 11/08/2018 CLINICAL DATA:  Worsening generalized weakness. On treatment for lymphoma. EXAM: CT HEAD WITHOUT CONTRAST TECHNIQUE: Contiguous axial images were obtained from the base of the skull through the vertex without intravenous contrast. COMPARISON:  PET-CT August 22, 2018 FINDINGS: BRAIN: No intraparenchymal hemorrhage, mass effect nor midline shift. No parenchymal brain volume loss for age. No hydrocephalus. Patchy supratentorial white matter hypodensities within normal range for patient's age, though non-specific are most compatible with chronic small vessel ischemic disease. No acute large vascular territory infarcts. No abnormal extra-axial fluid collections. Basal cisterns are patent. VASCULAR: Mild calcific atherosclerosis of the carotid siphons. SKULL: No skull fracture. 1 cm probable bone island occipital calvarium. No significant scalp soft tissue swelling. SINUSES/ORBITS: Trace paranasal sinus mucosal thickening. Mastoid air cells are well aerated.The  included ocular globes and orbital contents are non-suspicious. Status post bilateral ocular lens implants. OTHER: None. IMPRESSION: Normal CT HEAD for age. Electronically Signed   By: Elon Alas M.D.   On: 11/08/2018 22:34   Nm Pet Image Restag (ps) Skull Base To Thigh  Result Date: 11/22/2018 CLINICAL DATA:  Subsequent treatment strategy for  diffuse large B-lymphoma. EXAM: NUCLEAR MEDICINE PET SKULL BASE TO THIGH TECHNIQUE: 14.8 mCi F-18 FDG was injected intravenously. Full-ring PET imaging was performed from the skull base to thigh after the radiotracer. CT data was obtained and used for attenuation correction and anatomic localization. Fasting blood glucose: 116 mg/dl COMPARISON:  Multiple exams, including 08/22/2018 FINDINGS: Mediastinal blood pool activity: SUV max 2.9 Background hepatic activity: SUV max 4.2 NECK: The prior left tonsillar mass is no longer observed, and maximum SUV along the left palatine tonsillar region is 3.2 (the right side is 3.4). This is further reduced compared to the prior exam and not necessarily abnormal for this location. The large lymph node superficial to the right sternocleidomastoid measures 2.3 cm in short axis on image 40/4 (formerly the same) and has a maximum SUV of 3.3 (formerly 2.4), representing Deauville 3 activity. Incidental CT findings: Bilateral common carotid artery atherosclerotic calcification. CHEST: No significant abnormal hypermetabolic activity in this region. Incidental CT findings: Coronary, aortic arch, and branch vessel atherosclerotic vascular disease. Paraseptal emphysema. Right Port-A-Cath tip: SVC. ABDOMEN/PELVIS: No significant abnormal hypermetabolic activity in this region. Incidental CT findings: Aortoiliac atherosclerotic vascular disease. Bilateral nonobstructive renal calculi including a 2.6 cm staghorn calculus along the left renal collecting system. Umbilical hernia contains adipose tissue. Atrophic right iloipsoas and right  gluteal musculature. SKELETON: Continued high activity posterior to the deformity in the right proximal femur, with some osteoid in this vicinity, cerclage wire, and right total hip prosthesis. Maximum SUV in this vicinity is 13.8, formerly 12.3 this is likely benign. Incidental CT findings: Chronic vertebra plana at L1. Old healed right rib fractures. IMPRESSION: 1. The tonsillar mass has essentially resolved. The large presumed lymph node superficial to the right sternocleidomastoid is stable in size at 2.3 cm in short axis, with maximum SUV of 3.3 (formerly 2.4), representing Deauville 3 activity, previously Deauville 2. 2. Other imaging findings of potential clinical significance: Aortic Atherosclerosis (ICD10-I70.0) and Emphysema (ICD10-J43.9). Coronary atherosclerosis. Bilateral nonobstructive nephrolithiasis. Umbilical hernia contains adipose tissue. Chronic high activity posterior to the right hip implant along a region of bony irregularity and deformity probably reflecting chronic inflammation. L1 vertebra plana. Electronically Signed   By: Van Clines M.D.   On: 11/22/2018 14:14    ASSESSMENT & PLAN:  73 y.o. male with  1. Diffuse Large B-Cell Lymphoma,- Stage III Left tonsil -05/29/18 Left tonsil biopsy which revealed Diffuse Large B-Cell Lymphoma, which requires treatment  -06/07/18 LDH at 214, will monitor change through treatment.  -Baseline ECHO 06/14/18: EF 50-55% -Standard regimen of R-CHOP with G-CSF support for 6 cycles starting 06/23/18  06/16/18 PET/CT revealed 1. Deauville 5 hypermetabolic lesions in the palatine tonsils, left internal jugular chain, and involving a right inguinal lymph node. Deauville 4 involvement of a left inguinal lymph node and an enlarged subcutaneous lesion favoring lymph node superficial to the lower right sternocleidomastoid muscle in the neck. 2. Other imaging findings of potential clinical significance: Aortic Atherosclerosis. Coronary atherosclerosis.  Emphysema. Bilateral nonobstructive nephrolithiasis. Umbilical hernia contains adipose tissues. Old right rib fractures some of which are nonunited. 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   08/22/18 PET/CT revealed Interval response to therapy. Overall Deauville criteria 3. Interval decrease in size and FDG uptake associated with palatine tonsil lesions. There has been resolution of previous left level-II and bilateral inguinal lymph nodes. 2. Enlarged subcutaneous lesion superficial to the lower right sternocleidomastoid muscle exhibits decreased FDG uptake. Currently Deauville criteria 2. 3. No new or progressive sites of disease identified. 4.  Aortic  Atherosclerosis. 5. Multi vessel coronary artery atherosclerotic calcifications. 6. Unchanged vertebra plana deformity. 7. Kidney stones.   S/p 6 cycles of R-CHOP completed on 10/12/18  PLAN:   -Discussed pt labwork today, 11/30/18; HGB normalized to 13.6, WBC normal at 4.9k, PLT normal at 151k. Some dehydration, other chemistries are stable. LDH is WNL at 192. -Discussed the 11/22/18 PET/CT which revealed The tonsillar mass has essentially resolved. The large presumed lymph node superficial to the right sternocleidomastoid is stable in size at 2.3 cm in short axis, with maximum SUV of 3.3 (formerly 2.4), representing Deauville 3 activity, previously Deauville 2. 2. Other imaging findings of potential clinical significance: Aortic Atherosclerosis and Emphysema. Coronary atherosclerosis. Bilateral nonobstructive nephrolithiasis. Umbilical hernia contains adipose tissue. Chronic high activity posterior to the right hip implant along a region of bony irregularity and deformity probably reflecting chronic inflammation. L1 vertebra plana. -Recommend that the pt stay well hydrated -Will continue to monitor the stable subcutaneous lymph node superficial to right lower sternocleidomastoid muscle, which did not appear to change in character throughout the extent of treatment. Pt will  let me know if he senses any changes in this. -Will repeat CT Neck in 4-6 months -Continue following up with PT -Will order PO Magnesium replacement for one month -Discussed that if other less suppressive options are available, recommend these over Enbrel for future psoriasis management  -Continue with Physical Therapy, this has been very helpful for the patient  -Continue salt and baking soda mouthwashes 3-4 times each day -Will continue to monitor mild neuropathy in fingertips -Pt holding Enbrel. I previously prescribed Kenalog for topical use for now.  -Pt is up to speed with flu and pneumonia vaccines. Recommend continuing this with PCP. No live viruses.  -Will see the pt back in 2 months  2.  Patient Active Problem List   Diagnosis Date Noted  . Generalized weakness 11/08/2018  . Well adult exam 10/02/2018  . Counseling regarding advance care planning and goals of care 07/09/2018  . Diffuse large B-cell lymphoma ([place]) 06/23/2018  . Port-A-Cath in place 06/23/2018  . Hypogonadism in male 11/22/2017  . Nephrolithiasis 10/24/2017  . Recurrent dislocation of left patella 07/15/2017  . Recurrent subluxation of patella, left 07/08/2017  . S/P revision of total knee 04/11/2017  . Failed total knee, left, initial encounter ([place]) 04/09/2017  . Renal calculus, right 02/08/2017  . Protein-calorie malnutrition, severe ([place]) 12/08/2016  . Chronic obstructive pulmonary disease ([place]) 12/08/2016  . Staghorn renal calculus 11/05/2016  . Wound drainage right hip 11/15/2013  . Expected blood loss anemia 09/18/2013  . Hyponatremia 09/18/2013  . S/P right TH revision 09/17/2013  . Nonunion, fracture, Right tibia  07/03/2013  . UTI (urinary tract infection) 07/03/2013  . Pathological fracture due to osteoporosis with delayed healing 04/24/2013  . Osteoporosis with fracture 04/24/2013  . Rash and nonspecific skin eruption 03/08/2013  . Syncope 02/21/2013  . PAF (paroxysmal atrial fibrillation)  ([place]) 02/21/2013  . Chronic anticoagulation 02/21/2013  . Fall 02/08/2013  . Physical deconditioning 02/08/2013  . Low back pain radiating to both legs 02/08/2013  . Compression fracture of L1 lumbar vertebra (HCC) 02/08/2013  . Right tibial fracture 02/05/2013  . Obesity, unspecified 02/05/2013  . Osteomyelitis of right wrist ([place]) 11/02/2012  . Anemia 10/06/2012  . HTN (hypertension) 10/03/2012  . Psoriasis 10/03/2012  . Dyslipidemia 10/03/2012  . Vitamin D deficiency 10/03/2012  . GERD (gastroesophageal reflux disease) 10/03/2012  . Hyperglycemia 10/03/2012  . Anxiety 10/03/2012  . Depression 10/03/2012  .  CAD (coronary artery disease) 10/03/2012  . DJD (degenerative joint disease) 10/03/2012  . Chronic gingivitis 10/03/2012  . Low testosterone, possible hypogonadism 10/01/2012  . Normocytic anemia 09/30/2012  . Right femoral fracture ([place]) 09/29/2012  . Right forearm fracture 09/29/2012  . Atrial fibrillation ([place]) 09/29/2012  . Hypothyroidism 09/29/2012  . History of recurrent UTIs 09/29/2012   -continue mx of medical co-morbidities with PCP   RTC with Dr Irene Limbo with labs in 2 months   All of the patients questions were answered with apparent satisfaction. The patient knows to call the clinic with any problems, questions or concerns.  The total time spent in the appt was 25 minutes and more than 50% was on counseling and direct patient cares.    Sullivan Lone MD MS AAHIVMS [facility] Hematology/Oncology Physician [facility]  (Office):       [phone] (Work cell):  [phone] (Fax):           [phone]  11/30/2018 3:22 PM  I, Baldwin [place], am acting as a scribe for Dr. Sullivan Lone.   .I have reviewed the above documentation for accuracy and completeness, and I agree with the above. Brunetta Genera MD

## 2018-11-30 ENCOUNTER — Encounter: Payer: Medicare Other | Admitting: Physical Therapy

## 2018-11-30 ENCOUNTER — Telehealth: Payer: Self-pay | Admitting: Hematology

## 2018-11-30 ENCOUNTER — Inpatient Hospital Stay: Payer: Medicare Other | Attending: Hematology

## 2018-11-30 ENCOUNTER — Inpatient Hospital Stay (HOSPITAL_BASED_OUTPATIENT_CLINIC_OR_DEPARTMENT_OTHER): Payer: Medicare Other | Admitting: Hematology

## 2018-11-30 VITALS — BP 129/61 | HR 95 | Temp 98.0°F | Resp 18 | Ht 74.0 in | Wt 294.4 lb

## 2018-11-30 DIAGNOSIS — E860 Dehydration: Secondary | ICD-10-CM | POA: Insufficient documentation

## 2018-11-30 DIAGNOSIS — C8339 Diffuse large B-cell lymphoma, extranodal and solid organ sites: Secondary | ICD-10-CM

## 2018-11-30 DIAGNOSIS — C8330 Diffuse large B-cell lymphoma, unspecified site: Secondary | ICD-10-CM

## 2018-11-30 LAB — CBC WITH DIFFERENTIAL/PLATELET
Abs Immature Granulocytes: 0.02 10*3/uL (ref 0.00–0.07)
Basophils Absolute: 0 10*3/uL (ref 0.0–0.1)
Basophils Relative: 1 %
Eosinophils Absolute: 0.4 10*3/uL (ref 0.0–0.5)
Eosinophils Relative: 8 %
HCT: 42.7 % (ref 39.0–52.0)
HEMOGLOBIN: 13.6 g/dL (ref 13.0–17.0)
Immature Granulocytes: 0 %
LYMPHS PCT: 32 %
Lymphs Abs: 1.6 10*3/uL (ref 0.7–4.0)
MCH: 32.2 pg (ref 26.0–34.0)
MCHC: 31.9 g/dL (ref 30.0–36.0)
MCV: 100.9 fL — ABNORMAL HIGH (ref 80.0–100.0)
Monocytes Absolute: 0.5 10*3/uL (ref 0.1–1.0)
Monocytes Relative: 11 %
Neutro Abs: 2.4 10*3/uL (ref 1.7–7.7)
Neutrophils Relative %: 48 %
Platelets: 151 10*3/uL (ref 150–400)
RBC: 4.23 MIL/uL (ref 4.22–5.81)
RDW: 13.1 % (ref 11.5–15.5)
WBC: 4.9 10*3/uL (ref 4.0–10.5)
nRBC: 0 % (ref 0.0–0.2)

## 2018-11-30 LAB — CMP (CANCER CENTER ONLY)
ALT: 34 U/L (ref 0–44)
AST: 25 U/L (ref 15–41)
Albumin: 3.5 g/dL (ref 3.5–5.0)
Alkaline Phosphatase: 83 U/L (ref 38–126)
Anion gap: 9 (ref 5–15)
BUN: 33 mg/dL — ABNORMAL HIGH (ref 8–23)
CHLORIDE: 106 mmol/L (ref 98–111)
CO2: 24 mmol/L (ref 22–32)
CREATININE: 1.34 mg/dL — AB (ref 0.61–1.24)
Calcium: 9 mg/dL (ref 8.9–10.3)
GFR, Est AFR Am: >60 mL/min (ref >60)
GFR, Estimated: 52 mL/min — ABNORMAL LOW (ref >60)
Glucose, Bld: 86 mg/dL (ref 70–99)
Potassium: 4.5 mmol/L (ref 3.5–5.1)
Sodium: 139 mmol/L (ref 135–145)
Total Bilirubin: 0.5 mg/dL (ref 0.3–1.2)
Total Protein: 6.7 g/dL (ref 6.5–8.1)

## 2018-11-30 LAB — LACTATE DEHYDROGENASE: LDH: 192 U/L (ref 98–192)

## 2018-11-30 NOTE — Telephone Encounter (Signed)
Scheduled appt per 2/20 los - gave patient AVS and calender per los.

## 2018-12-05 ENCOUNTER — Ambulatory Visit: Payer: Medicare Other | Attending: Hematology | Admitting: Physical Therapy

## 2018-12-05 ENCOUNTER — Encounter: Payer: Medicare Other | Admitting: Physical Therapy

## 2018-12-05 DIAGNOSIS — Z9181 History of falling: Secondary | ICD-10-CM | POA: Diagnosis present

## 2018-12-05 DIAGNOSIS — R2689 Other abnormalities of gait and mobility: Secondary | ICD-10-CM | POA: Diagnosis present

## 2018-12-05 DIAGNOSIS — M6281 Muscle weakness (generalized): Secondary | ICD-10-CM | POA: Diagnosis present

## 2018-12-05 NOTE — Therapy (Signed)
[place], [place], [address] Phone: [phone]   Fax:  [phone]  Physical Therapy Re- Evaluation  Patient Details  Name: [name] MRN: [id number] Date of Birth: 10-Feb-1946 Referring Provider (PT): Dr. Irene Limbo   Encounter Date: 12/05/2018  PT End of Session - 12/05/18 1906    Visit Number  18    Number of Visits  24    Date for PT Re-Evaluation  02/03/19    PT Start Time  1515    PT Stop Time  1600    PT Time Calculation (min)  45 min    Activity Tolerance  Other (comment)   limited by anxiety and decrased balance   Behavior During Therapy  [facility] for tasks assessed/performed       Past Medical History:  Diagnosis Date  . A-fib (HCC)    hx.- sinus rhythm now  . Acid reflux   . AKI (acute kidney injury) (HCC)    mild  . Anxiety   . Arthritis   . Atrial dilatation, bilateral    Severly, enlargement  per ECHO 11/03/16  . BPH (benign prostatic hyperplasia)   . CAD (coronary artery disease)   . Cancer ([place])   . Depression   . Dislocation of internal right hip prosthesis, sequela   . Dysrhythmia    hx a fib  . Elevated brain natriuretic peptide (BNP) level   . Environmental allergies   . Fracture of wrist 10/23/13   LEFT  . High cholesterol   . History of Graves' disease   . History of kidney stones    surg. removal 02/2017  . History of syncope   . HLD (hyperlipidemia)   . Hypertension   . Hypothyroidism    thyroid removed  . Left knee injury    cap dislocated  . Low testosterone, possible hypogonadism 10/01/2012  . Lumbago   . Metabolic bone disease   . Multiple falls   . Osteoporosis    Severe  . Other abnormalities of gait and mobility   . Plaque psoriasis   . Sleep apnea    cpap machine-settings 17  . Thrombocytopenia ([place])   . Thyroid disease    HX GRAVES DISEASE  . Transfusion history    s/p 12'13 hip surgery  . Unsteady gait   . UTI (urinary tract infection)    . Vertebral compression fracture (HCC)    L1-wears brace    Past Surgical History:  Procedure Laterality Date  . APPENDECTOMY    . CARDIAC CATHETERIZATION  2013  . CATARACT EXTRACTION, BILATERAL    . COLONOSCOPY    . CYSTOSCOPY WITH RETROGRADE PYELOGRAM, URETEROSCOPY AND STENT PLACEMENT Left 11/07/2017   Procedure: CYSTOSCOPY WITH RETROGRADE PYELOGRAM, URETEROSCOPY AND STENT PLACEMENT;  Surgeon: Cleon Gustin, MD;  Location: [facility];  Service: Urology;  Laterality: Left;  . CYSTOSCOPY/URETEROSCOPY/HOLMIUM LASER/STENT PLACEMENT Right 03/11/2017   Procedure: CYSTOSCOPY/URETEROSCOPYSTENT PLACEMENT right ureter retrograde pylegram;  Surgeon: Cleon Gustin, MD;  [facility];  Service: Urology;  Laterality: Right;  . HARDWARE REMOVAL  10/05/2012   Procedure: HARDWARE REMOVAL;  Surgeon: [name], MD;  [facility];  Service: Orthopedics;  Laterality: Right;  REMOVING  STRYKER  GAMMA NAIL  . HARDWARE REMOVAL Right 07/03/2013   Procedure: HARDWARE REMOVAL RIGHT TIBIA ;  Surgeon: Rozanna Box, MD;  Location: [place];  Service: Orthopedics;  Laterality: Right;  . HIP CLOSED REDUCTION Right 10/15/2013   Procedure: CLOSED MANIPULATION HIP;  Surgeon: [name], MD;  [facility];  Service: Orthopedics;  Laterality: Right;  . hip revision     June 2017  . HOLMIUM LASER APPLICATION Right 11/15/971   Procedure: HOLMIUM LASER APPLICATION;  Surgeon: Cleon Gustin, MD;  [facility];  Service: Urology;  Laterality: Right;  . INCISION AND DRAINAGE HIP Right 11/16/2013   Procedure: IRRIGATION AND DEBRIDEMENT RIGHT HIP;  Surgeon: [name], MD;  [facility];  Service: Orthopedics;  Laterality: Right;  . IR IMAGING GUIDED PORT INSERTION  06/22/2018  . IR URETERAL STENT LEFT NEW ACCESS W/O SEP NEPHROSTOMY CATH  10/24/2017  . JOINT REPLACEMENT     hip-right x2  . KNEE SURGERY Bilateral 2012   Total knee replacements  . NEPHROLITHOTOMY Right 02/08/2017   Procedure:  NEPHROLITHOTOMY PERCUTANEOUS WITH SURGEON ACCESS;  Surgeon: Cleon Gustin, MD;  [facility];  Service: Urology;  Laterality: Right;  . NEPHROLITHOTOMY Left 10/24/2017   Procedure: NEPHROLITHOTOMY PERCUTANEOUS;  Surgeon: Cleon Gustin, MD;  [facility];  Service: Urology;  Laterality: Left;  . ORIF TIBIA FRACTURE Right 02/06/2013   Procedure: OPEN REDUCTION INTERNAL FIXATION (ORIF) TIBIA FRACTURE WITH IM ROD FIBULA;  Surgeon: Rozanna Box, MD;  Location: [place];  Service: Orthopedics;  Laterality: Right;  . ORIF TIBIA FRACTURE Right 07/03/2013   Procedure: RIGHT TIBIA NON UNION REPAIR ;  Surgeon: Rozanna Box, MD;  Location: [place];  Service: Orthopedics;  Laterality: Right;  . ORIF WRIST FRACTURE  10/02/2012   Procedure: OPEN REDUCTION INTERNAL FIXATION (ORIF) WRIST FRACTURE;  Surgeon: Roseanne Kaufman, MD;  [facility];  Service: Orthopedics;  Laterality: Right;  WITH   ANTIBIOTIC  CEMENT  . ORIF WRIST FRACTURE Left 10/28/2013   Procedure: OPEN REDUCTION INTERNAL FIXATION (ORIF) WRIST FRACTURE with allograft;  Surgeon: Roseanne Kaufman, MD;  [facility];  Service: Orthopedics;  Laterality: Left;  DVR Plate  . QUADRICEPS TENDON REPAIR Left 07/15/2017   Procedure: REPAIR QUADRICEP TENDON;  Surgeon: Frederik Pear, MD;  Location: [place];  Service: Orthopedics;  Laterality: Left;  . right femur surgery  05/2012  . THYROIDECTOMY  02/1986  . TOTAL HIP REVISION  10/05/2012   Procedure: TOTAL HIP REVISION;  Surgeon: [name], MD;  [facility];  Service: Orthopedics;  Laterality: Right;  RIGHT TOTAL HIP REVISION  . TOTAL HIP REVISION Right 09/17/2013   Procedure: REVISION RIGHT TOTAL HIP ARTHROPLASTY ;  Surgeon: [name], MD;  [facility];  Service: Orthopedics;  Laterality: Right;  . TOTAL HIP REVISION Right 10/26/2013   Procedure: REVISION RIGHT TOTAL HIP ARTHROPLASTY;  Surgeon: [name], MD;  [facility];  Service: Orthopedics;  Laterality: Right;   . TOTAL HIP REVISION  03/2016  . TOTAL KNEE REVISION Left 04/11/2017   Procedure: TOTAL KNEE REVISION PATELLA and TIBIA;  Surgeon: Frederik Pear, MD;  Location: [place];  Service: Orthopedics;  Laterality: Left;  . WRIST FRACTURE SURGERY  05/2012    There were no vitals filed for this visit.   Subjective Assessment - 12/05/18 1522    Subjective  Pt had a hospital admission due to a fall with possible UTI and had a 2 week stay at [facility].  His chemo is completed and he is in remission.  He comes back to PT for continue leg strengthening and balance work and to regain his confidence in gait.  He has has some problems with buckling in both knees.  He has episode of knee buckling more than once a day     Pertinent History  2013 fall resulting in a femur break on the Rt leg resulting in multiple surgeries due to failures including hip replacements another broken tibia leading to the discovery of osteoporosis and a metabolic bone disease.  After forteo injections x 2 years he is now in the osteopenia category.  Has a new scan coming up in a few weeks.  prior to the cancer diagnosis he was able to be confident with a cane.  Then was diagnosed with diffuse non-hogkin large cell B-lymphoma 07/09/18 located in tonsils and Rt groin. Treatment consists of an infusion every 3 weeks hopefully for a total of 5, A-fib controlled, HTN controlled with medication    Limitations  Walking;Standing;House hold activities    Patient Stated Goals  get my balance back    Currently in Pain?  Yes    Pain Score  3     Pain Location  Hip    Pain Orientation  Right    Pain Descriptors / Indicators  Aching   enough to be annoying    Pain Type  Chronic pain    Pain Onset  More than a month ago    Pain Frequency  Intermittent    Aggravating Factors   its never the same thing     Pain Relieving Factors  sitting , getting the weight off it          [facility] PT Assessment - 12/05/18 0001      Assessment   Medical Diagnosis   diffuse large B cell lymphoma    Referring Provider (PT)  Dr. Irene Limbo    Onset Date/Surgical Date  07/06/18    Hand Dominance  Right      Precautions   Precaution Comments  fall, osteoporosis, lymphoma      Restrictions   Weight Bearing Restrictions  No      Balance Screen   Has the patient fallen in the past 6 months  Yes      [place] residence    Living Arrangements  Spouse/significant other      Prior Function   Vocation  Retired    Biomedical scientist  retired Economist    Leisure  theater      Cognition   Overall Cognitive Status  Within Functional Limits for tasks assessed   pt seems more anxious today      Observation/Other Assessments   Observations  Pt appears to be more apprehensive with movement today, especailly with turning and with proglonged standing.  He admits to being very fearful of falling       Coordination   Gross Motor Movements are Fluid and Coordinated  No   movements are guarded/ easily becomes dyspneic with exertion     Functional Tests   Functional tests  Sit to Stand      Sit to Stand   Comments  5 reps, pt short of breath, pt has decreased control of descdent and than was unable to do anymore       Posture/Postural Control   Posture/Postural Control  Postural limitations    Postural Limitations  Rounded Shoulders;Forward head    Posture Comments  Rt LE shorter than Lt. Pt obese      ROM / Strength   AROM / PROM / Strength  AROM;Strength      Strength   Overall Strength Comments  pt appears deconditioned     Right/Left hand  Right;Left    Right Hand Grip (lbs)  20/20/15/    Left Hand Grip (lbs)  44/42/30    Right Hip Flexion  2/5    Right Hip Extension  2/5    Right Hip External Rotation   2/5    Right Hip ABduction  2/5    Left Hip Extension  3/5    Left Hip External Rotation  3/5    Left Hip ABduction  3/5    Right Knee Flexion  4/5    Right Knee Extension  4/5   difficult to  contol, able to break    Left Knee Flexion  5/5    Left Knee Extension  4/5      Bed Mobility   Bed Mobility  Rolling Right;Rolling Left;Supine to Sit;Sit to Supine    Rolling Right  Independent    Rolling Left  Independent    Supine to Sit  Independent   needs extra time   Sit to Supine  Independent      Transfers   Comments  needs to use hands for sit to stand       Ambulation/Gait   Ambulation/Gait  Yes    Ambulation/Gait Assistance  6: Modified independent (Device/Increase time)    Ambulation Distance (Feet)  50 Feet    Assistive device  Rolling walker    Gait Pattern  Step-through pattern;Left hip hike;Trendelenburg;Antalgic    Ambulation Surface  Level    Gait velocity  decreased    Gait Comments  pt dyspneic with exertion, limited ability to negotiate obstacles       Berg Balance Test   Sit to Stand  Able to stand  independently using hands    Standing Unsupported  Able to stand safely 2 minutes    Sitting with Back Unsupported but Feet Supported on Floor or Stool  Able to sit safely and securely 2 minutes    Stand to Sit  Sits safely with minimal use of hands    Transfers  Able to transfer safely, definite need of hands    Standing Unsupported with Eyes Closed  Able to stand 10 seconds safely    Standing Ubsupported with Feet Together  Needs help to attain position but able to stand for 30 seconds with feet together    From Standing, Reach Forward with Outstretched Arm  Can reach forward >12 cm safely (5")    From Standing Position, Pick up Object from Floor  Unable to pick up shoe, but reaches 2-5 cm (1-2") from shoe and balances independently    From Standing Position, Turn to Look Behind Over each Shoulder  Needs supervision when turning    Turn 360 Degrees  Needs assistance while turning    Standing Unsupported, Alternately Place Feet on Step/Stool  Needs assistance to keep from falling or unable to try    Standing Unsupported, One Foot in [employer] balance while  stepping or standing    Standing on One Leg  Unable to try or needs assist to prevent fall    Total Score  29    Berg comment:  pt unable to attempt tasks after attempting to turn 360 degrees.  Pt very anxious about falling                 Objective measurements completed on examination: See above findings.  PT Short Term Goals - 12/05/18 1549      PT SHORT TERM GOAL #1   Title  Episodes of "buckling" will decrease to one time a day.     Time  4    Period  Weeks    Status  New      PT SHORT TERM GOAL #2   Title  Pt will report he is doing strengthening exercise daily.     Time  4    Period  Weeks    Status  New      PT SHORT TERM GOAL #3   Title  Pt will be able to complete or attempt to complete all tasks fo teh BERG balace assessment indicating a decrease in movment anxiety         PT Long Term Goals - 12/05/18 1551      PT LONG TERM GOAL #1   Title  Pt will improve sit to stand x 30sec from 21" surface to 8 times     Baseline  09/28/2018  7 times in 30 sec. , 5 times on 12/04/2018     Time  8    Period  Weeks    Status  New      PT LONG TERM GOAL #2   Title  Pt will improve Berg Balance Assessment  to at least 35    Baseline  29 on re-eval       PT LONG TERM GOAL #3   Title  Pt will report he is able to go up and down the steps to enter his home without supervision     Time  8    Period  Weeks    Status  New      PT LONG TERM GOAL #4   Title  pt will be able to walk for 6 minutes without a rest break     Time  8    Period  Weeks             Plan - 12/05/18 1908    Clinical Impression Statement  Pt returns to PT after hospitalization for possible UTI and short term rehab stay at [facility].  Though he has completed his chemotherapy, he appears to be deconditioned with decreased balance during walking, decreased activity tolerance with noticeable dyspnea on exertion.  He now complains of "buckling" of both legs during  walking, but his right leg is stronger than his left. He has decreased performance in functional outcome measures and has decreased grip strength in both hands indicating overall decreased body strength. Recertification for 8 weeks sent today     Clinical Impairments Affecting Rehab Potential  history of Rt hip fractures and functional impairment     PT Frequency  2x / week    PT Duration  8 weeks    PT Treatment/Interventions  ADLs/Self Care Home Management;Therapeutic exercise;Patient/family education;Gait training;Stair training;Balance training;Neuromuscular re-education    PT Next Visit Plan   Do 6 minute walk test and document # of rest breaks required Progresive Strengthening to UE, core and LE especially hip abduction bilaterally ,Consider: Standing balance and weight shift. short range sit to stand from high table Nu step ( may need to transport him there in W/C) UBE, and UE strengthening with cable machine    Consulted and Agree with Plan of Care  Patient       Patient will benefit from skilled therapeutic intervention in order to improve the following deficits and impairments:  Abnormal  gait, Decreased activity tolerance, Decreased endurance, Decreased strength, Decreased balance, Difficulty walking, Cardiopulmonary status limiting activity, Impaired perceived functional ability, Obesity, Postural dysfunction  Visit Diagnosis: Other abnormalities of gait and mobility - Plan: PT plan of care cert/re-cert  History of falling - Plan: PT plan of care cert/re-cert  Muscle weakness (generalized) - Plan: PT plan of care cert/re-cert     Problem List Patient Active Problem List   Diagnosis Date Noted  . Generalized weakness 11/08/2018  . Well adult exam 10/02/2018  . Counseling regarding advance care planning and goals of care 07/09/2018  . Diffuse large B-cell lymphoma ([place]) 06/23/2018  . Port-A-Cath in place 06/23/2018  . Hypogonadism in male 11/22/2017  . Nephrolithiasis  10/24/2017  . Recurrent dislocation of left patella 07/15/2017  . Recurrent subluxation of patella, left 07/08/2017  . S/P revision of total knee 04/11/2017  . Failed total knee, left, initial encounter ([place]) 04/09/2017  . Renal calculus, right 02/08/2017  . Protein-calorie malnutrition, severe ([place]) 12/08/2016  . Chronic obstructive pulmonary disease ([place]) 12/08/2016  . Staghorn renal calculus 11/05/2016  . Wound drainage right hip 11/15/2013  . Expected blood loss anemia 09/18/2013  . Hyponatremia 09/18/2013  . S/P right TH revision 09/17/2013  . Nonunion, fracture, Right tibia  07/03/2013  . UTI (urinary tract infection) 07/03/2013  . Pathological fracture due to osteoporosis with delayed healing 04/24/2013  . Osteoporosis with fracture 04/24/2013  . Rash and nonspecific skin eruption 03/08/2013  . Syncope 02/21/2013  . PAF (paroxysmal atrial fibrillation) ([place]) 02/21/2013  . Chronic anticoagulation 02/21/2013  . Fall 02/08/2013  . Physical deconditioning 02/08/2013  . Low back pain radiating to both legs 02/08/2013  . Compression fracture of L1 lumbar vertebra (HCC) 02/08/2013  . Right tibial fracture 02/05/2013  . Obesity, unspecified 02/05/2013  . Osteomyelitis of right wrist ([place]) 11/02/2012  . Anemia 10/06/2012  . HTN (hypertension) 10/03/2012  . Psoriasis 10/03/2012  . Dyslipidemia 10/03/2012  . Vitamin D deficiency 10/03/2012  . GERD (gastroesophageal reflux disease) 10/03/2012  . Hyperglycemia 10/03/2012  . Anxiety 10/03/2012  . Depression 10/03/2012  . CAD (coronary artery disease) 10/03/2012  . DJD (degenerative joint disease) 10/03/2012  . Chronic gingivitis 10/03/2012  . Low testosterone, possible hypogonadism 10/01/2012  . Normocytic anemia 09/30/2012  . Right femoral fracture () 09/29/2012  . Right forearm fracture 09/29/2012  . Atrial fibrillation ([place]) 09/29/2012  . Hypothyroidism 09/29/2012  . History of recurrent UTIs 09/29/2012   Donato Heinz.  [facility] PT   Norwood Levo 12/05/2018, 7:32 PM  [place], [place], [address] Phone: [phone]   Fax:  [phone]  Name: ALEXANDRO LINE MRN: [id number] Date of Birth: 09/21/1946

## 2018-12-07 ENCOUNTER — Encounter: Payer: Medicare Other | Admitting: Physical Therapy

## 2018-12-13 ENCOUNTER — Ambulatory Visit: Payer: Medicare Other | Attending: Hematology

## 2018-12-13 DIAGNOSIS — Z9181 History of falling: Secondary | ICD-10-CM | POA: Insufficient documentation

## 2018-12-13 DIAGNOSIS — M6281 Muscle weakness (generalized): Secondary | ICD-10-CM | POA: Diagnosis present

## 2018-12-13 DIAGNOSIS — C8330 Diffuse large B-cell lymphoma, unspecified site: Secondary | ICD-10-CM

## 2018-12-13 DIAGNOSIS — R2689 Other abnormalities of gait and mobility: Secondary | ICD-10-CM | POA: Diagnosis not present

## 2018-12-13 NOTE — Therapy (Signed)
[place], [place], [address] Phone: [phone]   Fax:  [phone]  Physical Therapy Treatment  Patient Details  Name: [name] MRN: [id number] Date of Birth: [birth year] Referring Provider (PT): Dr. Irene Limbo   Encounter Date: 12/13/2018  PT End of Session - 12/13/18 1151    Visit Number  19    Number of Visits  40    Date for PT Re-Evaluation  02/03/19    PT Start Time  1102    PT Stop Time  1150    PT Time Calculation (min)  48 min    Activity Tolerance  Patient tolerated treatment well;Patient limited by fatigue    Behavior During Therapy  [facility] for tasks assessed/performed       Past Medical History:  Diagnosis Date  . A-fib (HCC)    hx.- sinus rhythm now  . Acid reflux   . AKI (acute kidney injury) (HCC)    mild  . Anxiety   . Arthritis   . Atrial dilatation, bilateral    Severly, enlargement  per ECHO 11/03/16  . BPH (benign prostatic hyperplasia)   . CAD (coronary artery disease)   . Cancer ([place])   . Depression   . Dislocation of internal right hip prosthesis, sequela   . Dysrhythmia    hx a fib  . Elevated brain natriuretic peptide (BNP) level   . Environmental allergies   . Fracture of wrist 10/23/13   LEFT  . High cholesterol   . History of Graves' disease   . History of kidney stones    surg. removal 02/2017  . History of syncope   . HLD (hyperlipidemia)   . Hypertension   . Hypothyroidism    thyroid removed  . Left knee injury    cap dislocated  . Low testosterone, possible hypogonadism 10/01/2012  . Lumbago   . Metabolic bone disease   . Multiple falls   . Osteoporosis    Severe  . Other abnormalities of gait and mobility   . Plaque psoriasis   . Sleep apnea    cpap machine-settings 17  . Thrombocytopenia ([place])   . Thyroid disease    HX GRAVES DISEASE  . Transfusion history    s/p 12'13 hip surgery  . Unsteady gait   . UTI (urinary tract infection)   .  Vertebral compression fracture (HCC)    L1-wears brace    Past Surgical History:  Procedure Laterality Date  . APPENDECTOMY    . CARDIAC CATHETERIZATION  2013  . CATARACT EXTRACTION, BILATERAL    . COLONOSCOPY    . CYSTOSCOPY WITH RETROGRADE PYELOGRAM, URETEROSCOPY AND STENT PLACEMENT Left 11/07/2017   Procedure: CYSTOSCOPY WITH RETROGRADE PYELOGRAM, URETEROSCOPY AND STENT PLACEMENT;  Surgeon: Cleon Gustin, MD;  Location: [facility];  Service: Urology;  Laterality: Left;  . CYSTOSCOPY/URETEROSCOPY/HOLMIUM LASER/STENT PLACEMENT Right 03/11/2017   Procedure: CYSTOSCOPY/URETEROSCOPYSTENT PLACEMENT right ureter retrograde pylegram;  Surgeon: Cleon Gustin, MD;  Location: [facility];  Service: Urology;  Laterality: Right;  . HARDWARE REMOVAL  10/05/2012   Procedure: HARDWARE REMOVAL;  Surgeon: [name], MD;  Location: [facility];  Service: Orthopedics;  Laterality: Right;  REMOVING  STRYKER  GAMMA NAIL  . HARDWARE REMOVAL Right 07/03/2013   Procedure: HARDWARE REMOVAL RIGHT TIBIA ;  Surgeon: Rozanna Box, MD;  Location: [place];  Service: Orthopedics;  Laterality: Right;  . HIP CLOSED REDUCTION Right 10/15/2013   Procedure: CLOSED MANIPULATION HIP;  Surgeon: Rodman Key  Marian Sorrow, MD;  Location: [facility];  Service: Orthopedics;  Laterality: Right;  . hip revision     June 2017  . HOLMIUM LASER APPLICATION Right 0/10/930   Procedure: HOLMIUM LASER APPLICATION;  Surgeon: Cleon Gustin, MD;  Location: [facility];  Service: Urology;  Laterality: Right;  . INCISION AND DRAINAGE HIP Right 11/16/2013   Procedure: IRRIGATION AND DEBRIDEMENT RIGHT HIP;  Surgeon: [name], MD;  Location: [facility];  Service: Orthopedics;  Laterality: Right;  . IR IMAGING GUIDED PORT INSERTION  06/22/2018  . IR URETERAL STENT LEFT NEW ACCESS W/O SEP NEPHROSTOMY CATH  10/24/2017  . JOINT REPLACEMENT     hip-right x2  . KNEE SURGERY Bilateral 2012   Total knee replacements  . NEPHROLITHOTOMY Right 02/08/2017   Procedure:  NEPHROLITHOTOMY PERCUTANEOUS WITH SURGEON ACCESS;  Surgeon: Cleon Gustin, MD;  [facility];  Service: Urology;  Laterality: Right;  . NEPHROLITHOTOMY Left 10/24/2017   Procedure: NEPHROLITHOTOMY PERCUTANEOUS;  Surgeon: Cleon Gustin, MD;  [facility];  Service: Urology;  Laterality: Left;  . ORIF TIBIA FRACTURE Right 02/06/2013   Procedure: OPEN REDUCTION INTERNAL FIXATION (ORIF) TIBIA FRACTURE WITH IM ROD FIBULA;  Surgeon: Rozanna Box, MD;  Location: [place];  Service: Orthopedics;  Laterality: Right;  . ORIF TIBIA FRACTURE Right 07/03/2013   Procedure: RIGHT TIBIA NON UNION REPAIR ;  Surgeon: Rozanna Box, MD;  Location: [place];  Service: Orthopedics;  Laterality: Right;  . ORIF WRIST FRACTURE  10/02/2012   Procedure: OPEN REDUCTION INTERNAL FIXATION (ORIF) WRIST FRACTURE;  Surgeon: Roseanne Kaufman, MD;  Location[facility];  Service: Orthopedics;  Laterality: Right;  WITH   ANTIBIOTIC  CEMENT  . ORIF WRIST FRACTURE Left 10/28/2013   Procedure: OPEN REDUCTION INTERNAL FIXATION (ORIF) WRIST FRACTURE with allograft;  Surgeon: Roseanne Kaufman, MD;  Location[facility];  Service: Orthopedics;  Laterality: Left;  DVR Plate  . QUADRICEPS TENDON REPAIR Left 07/15/2017   Procedure: REPAIR QUADRICEP TENDON;  Surgeon: Frederik Pear, MD;  Location: [place];  Service: Orthopedics;  Laterality: Left;  . right femur surgery  05/2012  . THYROIDECTOMY  02/1986  . TOTAL HIP REVISION  10/05/2012   Procedure: TOTAL HIP REVISION;  Surgeon: [name], MD;  [facility];  Service: Orthopedics;  Laterality: Right;  RIGHT TOTAL HIP REVISION  . TOTAL HIP REVISION Right 09/17/2013   Procedure: REVISION RIGHT TOTAL HIP ARTHROPLASTY ;  Surgeon: [name], MD;  [facility];  Service: Orthopedics;  Laterality: Right;  . TOTAL HIP REVISION Right 10/26/2013   Procedure: REVISION RIGHT TOTAL HIP ARTHROPLASTY;  Surgeon: [name], MD;  [facility];  Service: Orthopedics;  Laterality: Right;   . TOTAL HIP REVISION  03/2016  . TOTAL KNEE REVISION Left 04/11/2017   Procedure: TOTAL KNEE REVISION PATELLA and TIBIA;  Surgeon: Frederik Pear, MD;  Location: [place];  Service: Orthopedics;  Laterality: Left;  . WRIST FRACTURE SURGERY  05/2012    There were no vitals filed for this visit.      [facility] PT Assessment - 12/13/18 0001      6 minute walk test results    Aerobic Endurance Distance Walked  315    Endurance additional comments  1 seated rest break for 1:15 mins, used rolling walker. Then prolonged seated rest break required after                   [facility] Adult PT Treatment/Exercise -  12/13/18 0001      Neuro Re-ed    Neuro Re-ed Details   In // bars: Slow, controlled high knee marching 2x, then bil sidestepping 1x each way using +2 HHA leaning heavily on bars      Knee/Hip Exercises: Aerobic   Nustep  Level 4, x 10 mins monitoring ot throughout      Knee/Hip Exercises: Standing   Other Standing Knee Exercises  Up stairs step to, then tried walking down steps backwards to see if pt felt better control with quad but reports just going backwards made pt too nervous, used 4" steps. Required prolonged seated rest break after due to LE fatigue               PT Short Term Goals - 12/05/18 1549      PT SHORT TERM GOAL #1   Title  Episodes of "buckling" will decrease to one time a day.     Time  4    Period  Weeks    Status  New      PT SHORT TERM GOAL #2   Title  Pt will report he is doing strengthening exercise daily.     Time  4    Period  Weeks    Status  New      PT SHORT TERM GOAL #3   Title  Pt will be able to complete or attempt to complete all tasks fo teh BERG balace assessment indicating a decrease in movment anxiety         PT Long Term Goals - 12/05/18 1551      PT LONG TERM GOAL #1   Title  Pt will improve sit to stand x 30sec from 21" surface to 8 times     Baseline  09/28/2018  7 times in 30 sec. , 5 times on 12/04/2018     Time   8    Period  Weeks    Status  New      PT LONG TERM GOAL #2   Title  Pt will improve [place] Balance Assessment  to at least 35    Baseline  29 on re-eval       PT LONG TERM GOAL #3   Title  Pt will report he is able to go up and down the steps to enter his home without supervision     Time  8    Period  Weeks    Status  New      PT LONG TERM GOAL #4   Title  pt will be able to walk for 6 minutes without a rest break     Time  8    Period  Weeks            Plan - 12/13/18 1247    Clinical Impression Statement  6 min walk test done today, see flowsheet for results. Resumed treatment of balance with standing activities in // bars which pt did well with but did require multiple seated rest breaks due to LE fatigue. Also pt was able to perform 10 mins on Nustep. he reported legs feeling looser after session and encouraged by his efforts today.     Rehab Potential  Good    Clinical Impairments Affecting Rehab Potential  history of Rt hip fractures and functional impairment     PT Frequency  2x / week    PT Duration  8 weeks    PT Treatment/Interventions  ADLs/Self Care Home Management;Therapeutic exercise;Patient/family  education;Gait training;Stair training;Balance training;Neuromuscular re-education    PT Next Visit Plan  Progresive Strengthening to UE, core and LE especially hip abduction bilaterally; cont standing balance and weight shift. short range sit to stand from high table, cont Nu step and can add UBE, and UE strengthening with cable machine    Consulted and Agree with Plan of Care  Patient       Patient will benefit from skilled therapeutic intervention in order to improve the following deficits and impairments:  Abnormal gait, Decreased activity tolerance, Decreased endurance, Decreased strength, Decreased balance, Difficulty walking, Cardiopulmonary status limiting activity, Impaired perceived functional ability, Obesity, Postural dysfunction  Visit Diagnosis: Other  abnormalities of gait and mobility  History of falling  Muscle weakness (generalized)  Diffuse large B-cell lymphoma, unspecified body region [facility])     Problem List Patient Active Problem List   Diagnosis Date Noted  . Generalized weakness 11/08/2018  . Well adult exam 10/02/2018  . Counseling regarding advance care planning and goals of care 07/09/2018  . Diffuse large B-cell lymphoma ([place]) 06/23/2018  . Port-A-Cath in place 06/23/2018  . Hypogonadism in male 11/22/2017  . Nephrolithiasis 10/24/2017  . Recurrent dislocation of left patella 07/15/2017  . Recurrent subluxation of patella, left 07/08/2017  . S/P revision of total knee 04/11/2017  . Failed total knee, left, initial encounter ([place]) 04/09/2017  . Renal calculus, right 02/08/2017  . Protein-calorie malnutrition, severe ([place]) 12/08/2016  . Chronic obstructive pulmonary disease ([place]) 12/08/2016  . [place]ag[place] renal calculus 11/05/2016  . Wound drainage right hip 11/15/2013  . Expected blood loss anemia 09/18/2013  . Hyponatremia 09/18/2013  . S/P right TH revision 09/17/2013  . Nonunion, fracture, Right tibia  07/03/2013  . UTI (urinary tract infection) 07/03/2013  . Pathological fracture due to osteoporosis with delayed healing 04/24/2013  . Osteoporosis with fracture 04/24/2013  . Rash and nonspecific skin eruption 03/08/2013  . Syncope 02/21/2013  . PAF (paroxysmal atrial fibrillation) ([place]) 02/21/2013  . Chronic anticoagulation 02/21/2013  . Fall 02/08/2013  . Physical deconditioning 02/08/2013  . Low back pain radiating to both legs 02/08/2013  . Compression fracture of L1 lumbar vertebra (HCC) 02/08/2013  . Right tibial fracture 02/05/2013  . Obesity, unspecified 02/05/2013  . Osteomyelitis of right wrist ([place]) 11/02/2012  . Anemia 10/06/2012  . HTN (hypertension) 10/03/2012  . Psoriasis 10/03/2012  . Dyslipidemia 10/03/2012  . Vitamin D deficiency 10/03/2012  . GERD (gastroesophageal reflux disease)  10/03/2012  . Hyperglycemia 10/03/2012  . Anxiety 10/03/2012  . Depression 10/03/2012  . CAD (coronary artery disease) 10/03/2012  . DJD (degenerative joint disease) 10/03/2012  . Chronic gingivitis 10/03/2012  . Low testosterone, possible hypogonadism 10/01/2012  . Normocytic anemia 09/30/2012  . Right femoral fracture ([place]) 09/29/2012  . Right forearm fracture 09/29/2012  . Atrial fibrillation ([place]) 09/29/2012  . Hypothyroidism 09/29/2012  . History of recurrent UTIs 09/29/2012    Otelia Limes, PTA 12/13/2018, 1:01 PM  [place], [place], [address] Phone: [phone]   Fax:  [phone]  Name: [name] MRN: [id number] Date of Birth: Apr 21, 1946

## 2018-12-14 ENCOUNTER — Ambulatory Visit: Payer: Medicare Other

## 2018-12-14 DIAGNOSIS — M6281 Muscle weakness (generalized): Secondary | ICD-10-CM

## 2018-12-14 DIAGNOSIS — Z9181 History of falling: Secondary | ICD-10-CM

## 2018-12-14 DIAGNOSIS — R2689 Other abnormalities of gait and mobility: Secondary | ICD-10-CM | POA: Diagnosis not present

## 2018-12-14 DIAGNOSIS — C8330 Diffuse large B-cell lymphoma, unspecified site: Secondary | ICD-10-CM

## 2018-12-14 NOTE — Therapy (Addendum)
[place], [place], [address] Phone: [phone]   Fax:  [phone]  Physical Therapy Treatment   Progress Note Reporting Period 10/08/2018  to 12/14/2018  See note below for Objective Data and Assessment of Progress/Goals.       Patient Details  Name: [name] MRN: [id number] Date of Birth: 08-30-1946 Referring Provider (PT): Dr. Irene Limbo   Encounter Date: 12/14/2018  PT End of Session - 12/14/18 1018    Visit Number  20    Number of Visits  40    Date for PT Re-Evaluation  02/03/19    PT Start Time  0933    PT Stop Time  1013    PT Time Calculation (min)  40 min    Activity Tolerance  Patient tolerated treatment well;Patient limited by fatigue    Behavior During Therapy  [facility] for tasks assessed/performed       Past Medical History:  Diagnosis Date  . A-fib (HCC)    hx.- sinus rhythm now  . Acid reflux   . AKI (acute kidney injury) (HCC)    mild  . Anxiety   . Arthritis   . Atrial dilatation, bilateral    Severly, enlargement  per ECHO 11/03/16  . BPH (benign prostatic hyperplasia)   . CAD (coronary artery disease)   . Cancer ([place])   . Depression   . Dislocation of internal right hip prosthesis, sequela   . Dysrhythmia    hx a fib  . Elevated brain natriuretic peptide (BNP) level   . Environmental allergies   . Fracture of wrist 10/23/13   LEFT  . High cholesterol   . History of Graves' disease   . History of kidney stones    surg. removal 02/2017  . History of syncope   . HLD (hyperlipidemia)   . Hypertension   . Hypothyroidism    thyroid removed  . Left knee injury    cap dislocated  . Low testosterone, possible hypogonadism 10/01/2012  . Lumbago   . Metabolic bone disease   . Multiple falls   . Osteoporosis    Severe  . Other abnormalities of gait and mobility   . Plaque psoriasis   . Sleep apnea    cpap machine-settings 17  . Thrombocytopenia ([place])   .  Thyroid disease    HX GRAVES DISEASE  . Transfusion history    s/p 12'13 hip surgery  . Unsteady gait   . UTI (urinary tract infection)   . Vertebral compression fracture (HCC)    L1-wears brace    Past Surgical History:  Procedure Laterality Date  . APPENDECTOMY    . CARDIAC CATHETERIZATION  2013  . CATARACT EXTRACTION, BILATERAL    . COLONOSCOPY    . CYSTOSCOPY WITH RETROGRADE PYELOGRAM, URETEROSCOPY AND STENT PLACEMENT Left 11/07/2017   Procedure: CYSTOSCOPY WITH RETROGRADE PYELOGRAM, URETEROSCOPY AND STENT PLACEMENT;  Surgeon: Cleon Gustin, MD;  Location: [facility];  Service: Urology;  Laterality: Left;  . CYSTOSCOPY/URETEROSCOPY/HOLMIUM LASER/STENT PLACEMENT Right 03/11/2017   Procedure: CYSTOSCOPY/URETEROSCOPYSTENT PLACEMENT right ureter retrograde pylegram;  Surgeon: Cleon Gustin, MD;  Location: [facility];  Service: Urology;  Laterality: Right;  . HARDWARE REMOVAL  10/05/2012   Procedure: HARDWARE REMOVAL;  Surgeon: [name], MD;  Location: [facility];  Service: Orthopedics;  Laterality: Right;  REMOVING  STRYKER  GAMMA NAIL  . HARDWARE REMOVAL Right 07/03/2013   Procedure: HARDWARE REMOVAL RIGHT TIBIA ;  Surgeon: Rozanna Box, MD;  Location: [facility];  Service: Orthopedics;  Laterality: Right;  . HIP CLOSED REDUCTION Right 10/15/2013   Procedure: CLOSED MANIPULATION HIP;  Surgeon: [name], MD;  Location: [facility];  Service: Orthopedics;  Laterality: Right;  . hip revision     June 2017  . HOLMIUM LASER APPLICATION Right 05/11/1913   Procedure: HOLMIUM LASER APPLICATION;  Surgeon: Cleon Gustin, MD;  Location: [facility];  Service: Urology;  Laterality: Right;  . INCISION AND DRAINAGE HIP Right 11/16/2013   Procedure: IRRIGATION AND DEBRIDEMENT RIGHT HIP;  Surgeon: [name], MD;  Location: [facility];  Service: Orthopedics;  Laterality: Right;  . IR IMAGING GUIDED PORT INSERTION  06/22/2018  . IR URETERAL STENT LEFT NEW ACCESS W/O SEP NEPHROSTOMY CATH  10/24/2017  .  JOINT REPLACEMENT     hip-right x2  . KNEE SURGERY Bilateral 2012   Total knee replacements  . NEPHROLITHOTOMY Right 02/08/2017   Procedure: NEPHROLITHOTOMY PERCUTANEOUS WITH SURGEON ACCESS;  Surgeon: Cleon Gustin, MD;  [facility];  Service: Urology;  Laterality: Right;  . NEPHROLITHOTOMY Left 10/24/2017   Procedure: NEPHROLITHOTOMY PERCUTANEOUS;  Surgeon: Cleon Gustin, MD;  [facility];  Service: Urology;  Laterality: Left;  . ORIF TIBIA FRACTURE Right 02/06/2013   Procedure: OPEN REDUCTION INTERNAL FIXATION (ORIF) TIBIA FRACTURE WITH IM ROD FIBULA;  Surgeon: Rozanna Box, MD;  Location: [facility];  Service: Orthopedics;  Laterality: Right;  . ORIF TIBIA FRACTURE Right 07/03/2013   Procedure: RIGHT TIBIA NON UNION REPAIR ;  Surgeon: Rozanna Box, MD;  Location: [facility];  Service: Orthopedics;  Laterality: Right;  . ORIF WRIST FRACTURE  10/02/2012   Procedure: OPEN REDUCTION INTERNAL FIXATION (ORIF) WRIST FRACTURE;  Surgeon: Roseanne Kaufman, MD;  Location[facility];  Service: Orthopedics;  Laterality: Right;  WITH   ANTIBIOTIC  CEMENT  . ORIF WRIST FRACTURE Left 10/28/2013   Procedure: OPEN REDUCTION INTERNAL FIXATION (ORIF) WRIST FRACTURE with allograft;  Surgeon: Roseanne Kaufman, MD;  Location[facility];  Service: Orthopedics;  Laterality: Left;  DVR Plate  . QUADRICEPS TENDON REPAIR Left 07/15/2017   Procedure: REPAIR QUADRICEP TENDON;  Surgeon: Frederik Pear, MD;  Location: [facility];  Service: Orthopedics;  Laterality: Left;  . right femur surgery  05/2012  . THYROIDECTOMY  02/1986  . TOTAL HIP REVISION  10/05/2012   Procedure: TOTAL HIP REVISION;  Surgeon: [name], MD;  [facility];  Service: Orthopedics;  Laterality: Right;  RIGHT TOTAL HIP REVISION  . TOTAL HIP REVISION Right 09/17/2013   Procedure: REVISION RIGHT TOTAL HIP ARTHROPLASTY ;  Surgeon: [name], MD;  [facility];  Service: Orthopedics;  Laterality: Right;  . TOTAL HIP REVISION Right 10/26/2013    Procedure: REVISION RIGHT TOTAL HIP ARTHROPLASTY;  Surgeon: [name], MD;  Location[facility];  Service: Orthopedics;  Laterality: Right;  . TOTAL HIP REVISION  03/2016  . TOTAL KNEE REVISION Left 04/11/2017   Procedure: TOTAL KNEE REVISION PATELLA and TIBIA;  Surgeon: Frederik Pear, MD;  Location: [facility];  Service: Orthopedics;  Laterality: Left;  . WRIST FRACTURE SURGERY  05/2012    There were no vitals filed for this visit.  Subjective Assessment - 12/14/18 0939    Subjective  I was sore after last visit but not unreasonably so. Don't feel like I've quite woken up yet this morning.     Pertinent History  2013 fall resulting in a femur break on the Rt leg resulting in multiple  surgeries due to failures including hip replacements another broken tibia leading to the discovery of osteoporosis and a metabolic bone disease.  After forteo injections x 2 years he is now in the osteopenia category.  Has a new scan coming up in a few weeks.  prior to the cancer diagnosis he was able to be confident with a cane.  Then was diagnosed with diffuse non-hogkin large cell B-lymphoma 07/09/18 located in tonsils and Rt groin. Treatment consists of an infusion every 3 weeks hopefully for a total of 5, A-fib controlled, HTN controlled with medication    Patient Stated Goals  get my balance back    Currently in Pain?  Yes    Pain Score  4     Pain Location  Hip   ITB   Pain Orientation  Right    Pain Descriptors / Indicators  Aching    Pain Type  Chronic pain    Pain Onset  More than a month ago    Pain Frequency  Intermittent    Aggravating Factors   cold and damp weather    Pain Relieving Factors  sitting, getting the weight off of it                       [facility] Adult PT Treatment/Exercise - 12/14/18 0001      Neuro Re-ed    Neuro Re-ed Details   In // bars: Slow, controlled high knee marching 2x, then bil sidestepping with squats 2x each way with seated rest between sets using +2 HHA  leaning heavily on bars; then toe walking 1x      Knee/Hip Exercises: Aerobic   Nustep  Level 5, x 10 mins      Shoulder Exercises: ROM/Strengthening   Lat Pull  20 reps   2x10, 25#   Cybex Row  Other (comment)   3x10 at 25#   Cybex Row Limitations  tactile cues for correct scapular engagement at end of ROM               PT Short Term Goals - 12/05/18 1549      PT SHORT TERM GOAL #1   Title  Episodes of "buckling" will decrease to one time a day.     Time  4    Period  Weeks    Status  New      PT SHORT TERM GOAL #2   Title  Pt will report he is doing strengthening exercise daily.     Time  4    Period  Weeks    Status  New      PT SHORT TERM GOAL #3   Title  Pt will be able to complete or attempt to complete all tasks fo teh BERG balace assessment indicating a decrease in movment anxiety         PT Long Term Goals - 12/05/18 1551      PT LONG TERM GOAL #1   Title  Pt will improve sit to stand x 30sec from 21" surface to 8 times     Baseline  09/28/2018  7 times in 30 sec. , 5 times on 12/04/2018     Time  8    Period  Weeks    Status  New      PT LONG TERM GOAL #2   Title  Pt will improve Berg Balance Assessment  to at least 35    Baseline  29 on re-eval  PT LONG TERM GOAL #3   Title  Pt will report he is able to go up and down the steps to enter his home without supervision     Time  8    Period  Weeks    Status  New      PT LONG TERM GOAL #4   Title  pt will be able to walk for 6 minutes without a rest break     Time  8    Period  Weeks            Plan - 12/14/18 1021    Clinical Impression Statement  Continued with NuStep which pt was able to tolerate longer and with increased resistance. Also progressed pt to include weighted UE exercises which he reported feeling very beneficial. Pt did require multiple seated rest breaks due to LE faitgue, but rests were not as long today.     Rehab Potential  Good    Clinical Impairments  Affecting Rehab Potential  history of Rt hip fractures and functional impairment     PT Frequency  2x / week    PT Duration  8 weeks    PT Treatment/Interventions  ADLs/Self Care Home Management;Therapeutic exercise;Patient/family education;Gait training;Stair training;Balance training;Neuromuscular re-education    PT Next Visit Plan  Progresive Strengthening to UE, core and LE especially hip abduction bilaterally; cont standing balance and weight shift. short range sit to stand from high table, cont Nu step and can add UBE, and UE strengthening with cable machine    Consulted and Agree with Plan of Care  Patient       Patient will benefit from skilled therapeutic intervention in order to improve the following deficits and impairments:  Abnormal gait, Decreased activity tolerance, Decreased endurance, Decreased strength, Decreased balance, Difficulty walking, Cardiopulmonary status limiting activity, Impaired perceived functional ability, Obesity, Postural dysfunction  Visit Diagnosis: Other abnormalities of gait and mobility  History of falling  Muscle weakness (generalized)  Diffuse large B-cell lymphoma, unspecified body region [facility])     Problem List Patient Active Problem List   Diagnosis Date Noted  . Generalized weakness 11/08/2018  . Well adult exam 10/02/2018  . Counseling regarding advance care planning and goals of care 07/09/2018  . Diffuse large B-cell lymphoma ([place]) 06/23/2018  . Port-A-Cath in place 06/23/2018  . Hypogonadism in male 11/22/2017  . Nephrolithiasis 10/24/2017  . Recurrent dislocation of left patella 07/15/2017  . Recurrent subluxation of patella, left 07/08/2017  . S/P revision of total knee 04/11/2017  . Failed total knee, left, initial encounter ([place]) 04/09/2017  . Renal calculus, right 02/08/2017  . Protein-calorie malnutrition, severe ([place]) 12/08/2016  . Chronic obstructive pulmonary disease ([place]) 12/08/2016  . Staghorn renal calculus  11/05/2016  . Wound drainage right hip 11/15/2013  . Expected blood loss anemia 09/18/2013  . Hyponatremia 09/18/2013  . S/P right TH revision 09/17/2013  . Nonunion, fracture, Right tibia  07/03/2013  . UTI (urinary tract infection) 07/03/2013  . Pathological fracture due to osteoporosis with delayed healing 04/24/2013  . Osteoporosis with fracture 04/24/2013  . Rash and nonspecific skin eruption 03/08/2013  . Syncope 02/21/2013  . PAF (paroxysmal atrial fibrillation) ([place]) 02/21/2013  . Chronic anticoagulation 02/21/2013  . Fall 02/08/2013  . Physical deconditioning 02/08/2013  . Low back pain radiating to both legs 02/08/2013  . Compression fracture of L1 lumbar vertebra (HCC) 02/08/2013  . Right tibial fracture 02/05/2013  . Obesity, unspecified 02/05/2013  . Osteomyelitis of right wrist ([place]) 11/02/2012  .  Anemia 10/06/2012  . HTN (hypertension) 10/03/2012  . Psoriasis 10/03/2012  . Dyslipidemia 10/03/2012  . Vitamin D deficiency 10/03/2012  . GERD (gastroesophageal reflux disease) 10/03/2012  . Hyperglycemia 10/03/2012  . Anxiety 10/03/2012  . Depression 10/03/2012  . CAD (coronary artery disease) 10/03/2012  . DJD (degenerative joint disease) 10/03/2012  . Chronic gingivitis 10/03/2012  . Low testosterone, possible hypogonadism 10/01/2012  . Normocytic anemia 09/30/2012  . Right femoral fracture ([place]) 09/29/2012  . Right forearm fracture 09/29/2012  . Atrial fibrillation ([place]) 09/29/2012  . Hypothyroidism 09/29/2012  . History of recurrent UTIs 09/29/2012    Otelia Limes, PTA 12/14/2018, 10:23 AM  [place], [place], [address] Phone: [phone]   Fax:  [phone]  Name: [name] MRN: [id number] Date of Birth: 15-May-1946  Donato Heinz. Owens Shark, PT 12/14/2018

## 2018-12-17 ENCOUNTER — Encounter: Payer: Self-pay | Admitting: Hematology

## 2018-12-18 ENCOUNTER — Telehealth: Payer: Self-pay | Admitting: *Deleted

## 2018-12-18 NOTE — Telephone Encounter (Signed)
Attempted to contact patient in response to message in MyChart regarding resuming Enbrel. Per Dr. Irene Limbo, inform patient he would not advise the Enbrel as first choice for treatment if there is an alternative. Enbrel can cause lymphoma, so could cause a recurrence.  No answer and voice mail not named. Left message for patient to contact office at his convenience. No further information on message.

## 2018-12-19 ENCOUNTER — Encounter: Payer: Medicare Other | Admitting: Physical Therapy

## 2018-12-19 NOTE — Telephone Encounter (Signed)
Contacted by patient/partner.Gave information from Dr. Irene Limbo. They verbalized understanding and will discuss with dermatologist.

## 2018-12-20 ENCOUNTER — Encounter: Payer: Self-pay | Admitting: Physical Therapy

## 2018-12-20 ENCOUNTER — Other Ambulatory Visit: Payer: Self-pay

## 2018-12-20 ENCOUNTER — Ambulatory Visit: Payer: Medicare Other | Admitting: Physical Therapy

## 2018-12-20 DIAGNOSIS — M6281 Muscle weakness (generalized): Secondary | ICD-10-CM

## 2018-12-20 DIAGNOSIS — R2689 Other abnormalities of gait and mobility: Secondary | ICD-10-CM

## 2018-12-20 DIAGNOSIS — Z9181 History of falling: Secondary | ICD-10-CM

## 2018-12-20 NOTE — Therapy (Signed)
[place], [place], [address] Phone: [phone]   Fax:  [phone]  Physical Therapy Treatment  Patient Details  Name: [name] MRN: [id number] Date of Birth: 03-01-1946 Referring Provider (PT): Dr. Irene Limbo   Encounter Date: 12/20/2018  PT End of Session - 12/20/18 1204    Visit Number  21    Number of Visits  40    Date for PT Re-Evaluation  02/03/19    PT Start Time  2423    PT Stop Time  1100    PT Time Calculation (min)  45 min    Activity Tolerance  Patient tolerated treatment well    Behavior During Therapy  [facility] for tasks assessed/performed       Past Medical History:  Diagnosis Date  . A-fib (HCC)    hx.- sinus rhythm now  . Acid reflux   . AKI (acute kidney injury) (HCC)    mild  . Anxiety   . Arthritis   . Atrial dilatation, bilateral    Severly, enlargement  per ECHO 11/03/16  . BPH (benign prostatic hyperplasia)   . CAD (coronary artery disease)   . Cancer ([place])   . Depression   . Dislocation of internal right hip prosthesis, sequela   . Dysrhythmia    hx a fib  . Elevated brain natriuretic peptide (BNP) level   . Environmental allergies   . Fracture of wrist 10/23/13   LEFT  . High cholesterol   . History of Graves' disease   . History of kidney stones    surg. removal 02/2017  . History of syncope   . HLD (hyperlipidemia)   . Hypertension   . Hypothyroidism    thyroid removed  . Left knee injury    cap dislocated  . Low testosterone, possible hypogonadism 10/01/2012  . Lumbago   . Metabolic bone disease   . Multiple falls   . Osteoporosis    Severe  . Other abnormalities of gait and mobility   . Plaque psoriasis   . Sleep apnea    cpap machine-settings 17  . Thrombocytopenia ([place])   . Thyroid disease    HX GRAVES DISEASE  . Transfusion history    s/p 12'13 hip surgery  . Unsteady gait   . UTI (urinary tract infection)   . Vertebral compression  fracture (HCC)    L1-wears brace    Past Surgical History:  Procedure Laterality Date  . APPENDECTOMY    . CARDIAC CATHETERIZATION  2013  . CATARACT EXTRACTION, BILATERAL    . COLONOSCOPY    . CYSTOSCOPY WITH RETROGRADE PYELOGRAM, URETEROSCOPY AND STENT PLACEMENT Left 11/07/2017   Procedure: CYSTOSCOPY WITH RETROGRADE PYELOGRAM, URETEROSCOPY AND STENT PLACEMENT;  Surgeon: Cleon Gustin, MD;  Location: [facility];  Service: Urology;  Laterality: Left;  . CYSTOSCOPY/URETEROSCOPY/HOLMIUM LASER/STENT PLACEMENT Right 03/11/2017   Procedure: CYSTOSCOPY/URETEROSCOPYSTENT PLACEMENT right ureter retrograde pylegram;  Surgeon: Cleon Gustin, MD;  Location: [facility];  Service: Urology;  Laterality: Right;  . HARDWARE REMOVAL  10/05/2012   Procedure: HARDWARE REMOVAL;  Surgeon: [name], MD;  Location: [facility];  Service: Orthopedics;  Laterality: Right;  REMOVING  STRYKER  GAMMA NAIL  . HARDWARE REMOVAL Right 07/03/2013   Procedure: HARDWARE REMOVAL RIGHT TIBIA ;  Surgeon: Rozanna Box, MD;  Location: [place];  Service: Orthopedics;  Laterality: Right;  . HIP CLOSED REDUCTION Right 10/15/2013   Procedure: CLOSED MANIPULATION HIP;  Surgeon: [name], MD;  [facility];  Service: Orthopedics;  Laterality: Right;  . hip revision     June 2017  . HOLMIUM LASER APPLICATION Right 10/16/1094   Procedure: HOLMIUM LASER APPLICATION;  Surgeon: Cleon Gustin, MD;  [facility];  Service: Urology;  Laterality: Right;  . INCISION AND DRAINAGE HIP Right 11/16/2013   Procedure: IRRIGATION AND DEBRIDEMENT RIGHT HIP;  Surgeon: [name], MD;  Location[facility];  Service: Orthopedics;  Laterality: Right;  . IR IMAGING GUIDED PORT INSERTION  06/22/2018  . IR URETERAL STENT LEFT NEW ACCESS W/O SEP NEPHROSTOMY CATH  10/24/2017  . JOINT REPLACEMENT     hip-right x2  . KNEE SURGERY Bilateral 2012   Total knee replacements  . NEPHROLITHOTOMY Right 02/08/2017   Procedure: NEPHROLITHOTOMY  PERCUTANEOUS WITH SURGEON ACCESS;  Surgeon: Cleon Gustin, MD;  [facility];  Service: Urology;  Laterality: Right;  . NEPHROLITHOTOMY Left 10/24/2017   Procedure: NEPHROLITHOTOMY PERCUTANEOUS;  Surgeon: Cleon Gustin, MD;  [facility];  Service: Urology;  Laterality: Left;  . ORIF TIBIA FRACTURE Right 02/06/2013   Procedure: OPEN REDUCTION INTERNAL FIXATION (ORIF) TIBIA FRACTURE WITH IM ROD FIBULA;  Surgeon: Rozanna Box, MD;  Location: [place];  Service: Orthopedics;  Laterality: Right;  . ORIF TIBIA FRACTURE Right 07/03/2013   Procedure: RIGHT TIBIA NON UNION REPAIR ;  Surgeon: Rozanna Box, MD;  Location: [place];  Service: Orthopedics;  Laterality: Right;  . ORIF WRIST FRACTURE  10/02/2012   Procedure: OPEN REDUCTION INTERNAL FIXATION (ORIF) WRIST FRACTURE;  Surgeon: Roseanne Kaufman, MD;  [facility];  Service: Orthopedics;  Laterality: Right;  WITH   ANTIBIOTIC  CEMENT  . ORIF WRIST FRACTURE Left 10/28/2013   Procedure: OPEN REDUCTION INTERNAL FIXATION (ORIF) WRIST FRACTURE with allograft;  Surgeon: Roseanne Kaufman, MD;  [facility];  Service: Orthopedics;  Laterality: Left;  DVR Plate  . QUADRICEPS TENDON REPAIR Left 07/15/2017   Procedure: REPAIR QUADRICEP TENDON;  Surgeon: Frederik Pear, MD;  Location: [facility];  Service: Orthopedics;  Laterality: Left;  . right femur surgery  05/2012  . THYROIDECTOMY  02/1986  . TOTAL HIP REVISION  10/05/2012   Procedure: TOTAL HIP REVISION;  Surgeon: [name], MD;  [facility];  Service: Orthopedics;  Laterality: Right;  RIGHT TOTAL HIP REVISION  . TOTAL HIP REVISION Right 09/17/2013   Procedure: REVISION RIGHT TOTAL HIP ARTHROPLASTY ;  Surgeon: [name], MD;  [facility];  Service: Orthopedics;  Laterality: Right;  . TOTAL HIP REVISION Right 10/26/2013   Procedure: REVISION RIGHT TOTAL HIP ARTHROPLASTY;  Surgeon: [name], MD;  [facility];  Service: Orthopedics;  Laterality: Right;  . TOTAL HIP  REVISION  03/2016  . TOTAL KNEE REVISION Left 04/11/2017   Procedure: TOTAL KNEE REVISION PATELLA and TIBIA;  Surgeon: Frederik Pear, MD;  Location: [place];  Service: Orthopedics;  Laterality: Left;  . WRIST FRACTURE SURGERY  05/2012    There were no vitals filed for this visit.  Subjective Assessment - 12/20/18 1016    Subjective  Pt reports his legs are sore today as he went to a show in [place] last night .  He is continuing to have more falls and says it is because he is not paying attention and when his legs start to buckle he goes down     Pertinent History  2013 fall resulting in a femur break on the Rt leg resulting in multiple surgeries due to  failures including hip replacements another broken tibia leading to the discovery of osteoporosis and a metabolic bone disease.  After forteo injections x 2 years he is now in the osteopenia category.  Has a new scan coming up in a few weeks.  prior to the cancer diagnosis he was able to be confident with a cane.  Then was diagnosed with diffuse non-hogkin large cell B-lymphoma 07/09/18 located in tonsils and Rt groin. Treatment consists of an infusion every 3 weeks hopefully for a total of 5, A-fib controlled, HTN controlled with medication    Patient Stated Goals  get my balance back    Currently in Pain?  Yes    Pain Score  1     Pain Location  Leg    Pain Orientation  Right;Left    Pain Descriptors / Indicators  Aching   post exercise soreness                       [facility] Adult PT Treatment/Exercise - 12/20/18 0001      Lumbar Exercises: Standing   Other Standing Lumbar Exercises  standing partial dead lifts with dowel along back as cue for posture and attention to keep knees soft    4 sets of 8 reps    Other Standing Lumbar Exercises  staggered stance weight shifts 20 reps in with each foot forward       Lumbar Exercises: Seated   Long Arc Quad on Chair  AAROM;Right;Left;20 reps    Other Seated Lumbar Exercises  manual  resistance to trunk for alternating stabalization isometrics       Knee/Hip Exercises: Seated   Hamstring Curl  Strengthening;Right;Left;3 sets;10 reps    Hamstring Limitations  green theraband     Sit to Sand  10 reps      Shoulder Exercises: Seated   Elevation  Strengthening;Right;Left;Weights    Elevation Weight (lbs)  6   4 sets of 8    Row  Strengthening;Theraband    Theraband Level (Shoulder Row)  Level 3 (Green)    Horizontal ABduction  Strengthening;Right;Left;20 reps;Theraband    Theraband Level (Shoulder Horizontal ABduction)  Level 3 (Green)    Diagonals  Strengthening;Right;Left;10 reps;Theraband    Theraband Level (Shoulder Diagonals)  Level 3 (Green)    Other Seated Exercises  bicep curls x 20 reps with 6 #               PT Short Term Goals - 12/05/18 1549      PT SHORT TERM GOAL #1   Title  Episodes of "buckling" will decrease to one time a day.     Time  4    Period  Weeks    Status  New      PT SHORT TERM GOAL #2   Title  Pt will report he is doing strengthening exercise daily.     Time  4    Period  Weeks    Status  New      PT SHORT TERM GOAL #3   Title  Pt will be able to complete or attempt to complete all tasks fo teh BERG balace assessment indicating a decrease in movment anxiety         PT Long Term Goals - 12/05/18 1551      PT LONG TERM GOAL #1   Title  Pt will improve sit to stand x 30sec from 21" surface to 8 times     Baseline  09/28/2018  7 times in 30 sec. , 5 times on 12/04/2018     Time  8    Period  Weeks    Status  New      PT LONG TERM GOAL #2   Title  Pt will improve Berg Balance Assessment  to at least 35    Baseline  29 on re-eval       PT LONG TERM GOAL #3   Title  Pt will report he is able to go up and down the steps to enter his home without supervision     Time  8    Period  Weeks    Status  New      PT LONG TERM GOAL #4   Title  pt will be able to walk for 6 minutes without a rest break     Time  8     Period  Weeks            Plan - 12/20/18 1205    Clinical Impression Statement  Pt was able to participate well with exercise today and seemed to require fewer rest breaks though barbell weight was increased.  Good performance of hip hinge and benefitted from the cues from dowel on his back and cognitively was able to keep knees from locking out. 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 Progresive Strengthening to UE, core and LE especially hip abduction bilaterally; cont standing balance and weight shift. short range sit to stand from high table, cont Nu step and can add UBE, and UE strengthening with cable machine       Patient will benefit from skilled therapeutic intervention in order to improve the following deficits and impairments:  Abnormal gait, Decreased activity tolerance, Decreased endurance, Decreased strength, Decreased balance, Difficulty walking, Cardiopulmonary status limiting activity, Impaired perceived functional ability, Obesity, Postural dysfunction  Visit Diagnosis: Other abnormalities of gait and mobility  History of falling  Muscle weakness (generalized)     Problem List Patient Active Problem List   Diagnosis Date Noted  . Generalized weakness 11/08/2018  . Well adult exam 10/02/2018  . Counseling regarding advance care planning and  goals of care 07/09/2018  . Diffuse large B-cell lymphoma ([place]) 06/23/2018  . Port-A-Cath in place 06/23/2018  . Hypogonadism in male 11/22/2017  . Nephrolithiasis 10/24/2017  . Recurrent dislocation of left patella 07/15/2017  . Recurrent subluxation of patella, left 07/08/2017  . S/P revision of total knee 04/11/2017  . Failed total knee, left, initial encounter ([place]) 04/09/2017  . Renal calculus, right 02/08/2017  . Protein-calorie malnutrition, severe ([place]) 12/08/2016  . Chronic obstructive pulmonary disease ([place]) 12/08/2016  . Staghorn renal calculus 11/05/2016  . Wound drainage right hip 11/15/2013  . Expected blood loss anemia 09/18/2013  . Hyponatremia 09/18/2013  . S/P right TH revision 09/17/2013  . Nonunion, fracture, Right tibia  07/03/2013  . UTI (urinary tract infection) 07/03/2013  . Pathological fracture due to osteoporosis with delayed healing 04/24/2013  . Osteoporosis with fracture 04/24/2013  . Rash and nonspecific skin eruption 03/08/2013  . Syncope 02/21/2013  . PAF (paroxysmal atrial fibrillation) ([place]) 02/21/2013  . Chronic anticoagulation 02/21/2013  . Fall 02/08/2013  . Physical deconditioning 02/08/2013  . Low back pain radiating to both legs 02/08/2013  . Compression fracture of L1 lumbar vertebra (HCC) 02/08/2013  . Right tibial fracture 02/05/2013  . Obesity, unspecified 02/05/2013  . Osteomyelitis of right wrist ([place]) 11/02/2012  . Anemia 10/06/2012  . HTN (hypertension) 10/03/2012  . Psoriasis 10/03/2012  . Dyslipidemia 10/03/2012  . Vitamin D deficiency 10/03/2012  . GERD (gastroesophageal reflux disease) 10/03/2012  . Hyperglycemia 10/03/2012  . Anxiety 10/03/2012  . Depression 10/03/2012  . CAD (coronary artery disease) 10/03/2012  . DJD (degenerative joint disease) 10/03/2012  . Chronic gingivitis 10/03/2012  . Low testosterone, possible hypogonadism 10/01/2012  . Normocytic anemia 09/30/2012  . Right femoral fracture ([place]) 09/29/2012   . Right forearm fracture 09/29/2012  . Atrial fibrillation ([place]) 09/29/2012  . Hypothyroidism 09/29/2012  . History of recurrent UTIs 09/29/2012   [name] PT  [name] 12/20/2018, 12:10 PM  [place], [place], [address] Phone: [phone]   Fax:  [phone]  Name: [name] MRN: [id number] Date of Birth: 10/15/1945

## 2018-12-22 ENCOUNTER — Ambulatory Visit: Payer: Medicare Other | Admitting: Physical Therapy

## 2018-12-22 ENCOUNTER — Encounter: Payer: Self-pay | Admitting: Physical Therapy

## 2018-12-22 ENCOUNTER — Other Ambulatory Visit: Payer: Self-pay

## 2018-12-22 DIAGNOSIS — M6281 Muscle weakness (generalized): Secondary | ICD-10-CM

## 2018-12-22 DIAGNOSIS — R2689 Other abnormalities of gait and mobility: Secondary | ICD-10-CM | POA: Diagnosis not present

## 2018-12-22 DIAGNOSIS — Z9181 History of falling: Secondary | ICD-10-CM

## 2018-12-22 NOTE — Therapy (Signed)
[place], [place], [address] Phone: [phone]   Fax:  [phone]  Physical Therapy Treatment  Patient Details  Name: [name] MRN: [id number] Date of Birth: [birth year] Referring Provider (PT): Dr. Irene Limbo   Encounter Date: 12/22/2018  PT End of Session - 12/22/18 1155    Visit Number  22    Number of Visits  40    Date for PT Re-Evaluation  02/03/19    PT Start Time  1100    PT Stop Time  1145    PT Time Calculation (min)  45 min    Activity Tolerance  Patient tolerated treatment well    Behavior During Therapy  [facility] for tasks assessed/performed       Past Medical History:  Diagnosis Date  . A-fib (HCC)    hx.- sinus rhythm now  . Acid reflux   . AKI (acute kidney injury) (HCC)    mild  . Anxiety   . Arthritis   . Atrial dilatation, bilateral    Severly, enlargement  per ECHO 11/03/16  . BPH (benign prostatic hyperplasia)   . CAD (coronary artery disease)   . Cancer ([place])   . Depression   . Dislocation of internal right hip prosthesis, sequela   . Dysrhythmia    hx a fib  . Elevated brain natriuretic peptide (BNP) level   . Environmental allergies   . Fracture of wrist 10/23/13   LEFT  . High cholesterol   . History of Graves' disease   . History of kidney stones    surg. removal 02/2017  . History of syncope   . HLD (hyperlipidemia)   . Hypertension   . Hypothyroidism    thyroid removed  . Left knee injury    cap dislocated  . Low testosterone, possible hypogonadism 10/01/2012  . Lumbago   . Metabolic bone disease   . Multiple falls   . Osteoporosis    Severe  . Other abnormalities of gait and mobility   . Plaque psoriasis   . Sleep apnea    cpap machine-settings 17  . Thrombocytopenia ([place])   . Thyroid disease    HX GRAVES DISEASE  . Transfusion history    s/p 12'13 hip surgery  . Unsteady gait   . UTI (urinary tract infection)   . Vertebral compression  fracture (HCC)    L1-wears brace    Past Surgical History:  Procedure Laterality Date  . APPENDECTOMY    . CARDIAC CATHETERIZATION  2013  . CATARACT EXTRACTION, BILATERAL    . COLONOSCOPY    . CYSTOSCOPY WITH RETROGRADE PYELOGRAM, URETEROSCOPY AND STENT PLACEMENT Left 11/07/2017   Procedure: CYSTOSCOPY WITH RETROGRADE PYELOGRAM, URETEROSCOPY AND STENT PLACEMENT;  Surgeon: Cleon Gustin, MD;  Location: [facility];  Service: Urology;  Laterality: Left;  . CYSTOSCOPY/URETEROSCOPY/HOLMIUM LASER/STENT PLACEMENT Right 03/11/2017   Procedure: CYSTOSCOPY/URETEROSCOPYSTENT PLACEMENT right ureter retrograde pylegram;  Surgeon: Cleon Gustin, MD;  Location: [facility];  Service: Urology;  Laterality: Right;  . HARDWARE REMOVAL  10/05/2012   Procedure: HARDWARE REMOVAL;  Surgeon: [name], MD;  Location: [facility];  Service: Orthopedics;  Laterality: Right;  REMOVING  STRYKER  GAMMA NAIL  . HARDWARE REMOVAL Right 07/03/2013   Procedure: HARDWARE REMOVAL RIGHT TIBIA ;  Surgeon: Rozanna Box, MD;  Location: [place];  Service: Orthopedics;  Laterality: Right;  . HIP CLOSED REDUCTION Right 10/15/2013   Procedure: CLOSED MANIPULATION HIP;  Surgeon: [name], MD;  [facility];  Service: Orthopedics;  Laterality: Right;  . hip revision     June 2017  . HOLMIUM LASER APPLICATION Right 03/15/7845   Procedure: HOLMIUM LASER APPLICATION;  Surgeon: Cleon Gustin, MD;  Location: [facility];  Service: Urology;  Laterality: Right;  . INCISION AND DRAINAGE HIP Right 11/16/2013   Procedure: IRRIGATION AND DEBRIDEMENT RIGHT HIP;  Surgeon: [name], MD;  Location[facility];  Service: Orthopedics;  Laterality: Right;  . IR IMAGING GUIDED PORT INSERTION  06/22/2018  . IR URETERAL STENT LEFT NEW ACCESS W/O SEP NEPHROSTOMY CATH  10/24/2017  . JOINT REPLACEMENT     hip-right x2  . KNEE SURGERY Bilateral 2012   Total knee replacements  . NEPHROLITHOTOMY Right 02/08/2017   Procedure: NEPHROLITHOTOMY  PERCUTANEOUS WITH SURGEON ACCESS;  Surgeon: Cleon Gustin, MD;  [facility];  Service: Urology;  Laterality: Right;  . NEPHROLITHOTOMY Left 10/24/2017   Procedure: NEPHROLITHOTOMY PERCUTANEOUS;  Surgeon: Cleon Gustin, MD;  [facility];  Service: Urology;  Laterality: Left;  . ORIF TIBIA FRACTURE Right 02/06/2013   Procedure: OPEN REDUCTION INTERNAL FIXATION (ORIF) TIBIA FRACTURE WITH IM ROD FIBULA;  Surgeon: Rozanna Box, MD;  Location: [place];  Service: Orthopedics;  Laterality: Right;  . ORIF TIBIA FRACTURE Right 07/03/2013   Procedure: RIGHT TIBIA NON UNION REPAIR ;  Surgeon: Rozanna Box, MD;  Location: [place];  Service: Orthopedics;  Laterality: Right;  . ORIF WRIST FRACTURE  10/02/2012   Procedure: OPEN REDUCTION INTERNAL FIXATION (ORIF) WRIST FRACTURE;  Surgeon: Roseanne Kaufman, MD;  [facility];  Service: Orthopedics;  Laterality: Right;  WITH   ANTIBIOTIC  CEMENT  . ORIF WRIST FRACTURE Left 10/28/2013   Procedure: OPEN REDUCTION INTERNAL FIXATION (ORIF) WRIST FRACTURE with allograft;  Surgeon: Roseanne Kaufman, MD;  [facility];  Service: Orthopedics;  Laterality: Left;  DVR Plate  . QUADRICEPS TENDON REPAIR Left 07/15/2017   Procedure: REPAIR QUADRICEP TENDON;  Surgeon: Frederik Pear, MD;  Location: [facility];  Service: Orthopedics;  Laterality: Left;  . right femur surgery  05/2012  . THYROIDECTOMY  02/1986  . TOTAL HIP REVISION  10/05/2012   Procedure: TOTAL HIP REVISION;  Surgeon: [name], MD;  [facility];  Service: Orthopedics;  Laterality: Right;  RIGHT TOTAL HIP REVISION  . TOTAL HIP REVISION Right 09/17/2013   Procedure: REVISION RIGHT TOTAL HIP ARTHROPLASTY ;  Surgeon: [name], MD;  [facility];  Service: Orthopedics;  Laterality: Right;  . TOTAL HIP REVISION Right 10/26/2013   Procedure: REVISION RIGHT TOTAL HIP ARTHROPLASTY;  Surgeon: [name], MD;  [facility];  Service: Orthopedics;  Laterality: Right;  . TOTAL HIP  REVISION  03/2016  . TOTAL KNEE REVISION Left 04/11/2017   Procedure: TOTAL KNEE REVISION PATELLA and TIBIA;  Surgeon: Frederik Pear, MD;  Location: [place];  Service: Orthopedics;  Laterality: Left;  . WRIST FRACTURE SURGERY  05/2012    There were no vitals filed for this visit.  Subjective Assessment - 12/22/18 1154    Subjective  "just the usual"  pt states he had some short term muscle soreness after exercise     Pertinent History  2013 fall resulting in a femur break on the Rt leg resulting in multiple surgeries due to failures including hip replacements another broken tibia leading to the discovery of osteoporosis and a metabolic bone disease.  After forteo injections x 2 years he is now in the osteopenia  category.  Has a new scan coming up in a few weeks.  prior to the cancer diagnosis he was able to be confident with a cane.  Then was diagnosed with diffuse non-hogkin large cell B-lymphoma 07/09/18 located in tonsils and Rt groin. Treatment consists of an infusion every 3 weeks hopefully for a total of 5, A-fib controlled, HTN controlled with medication    Patient Stated Goals  get my balance back    Currently in Pain?  Yes    Pain Score  --   did not rate                       [facility] Adult PT Treatment/Exercise - 12/22/18 0001      High Level Balance   High Level Balance Activities  Backward walking;Sudden stops;Marching forwards;Other (comment)   staggered stance weight shifting 3 rounds of 20 sec    High Level Balance Comments  had pt walk with croc on right foot to increase leg length and sock on left foot . Pelvis was much more equal and pt felt that gait was easier with less pain in the back. He had looked into a built up shoe       Lumbar Exercises: Aerobic   UBE (Upper Arm Bike)  6 minutes    pt did well with UE, but prefers to exercise on the NuStep      Knee/Hip Exercises: Aerobic   Nustep  Level 5, x 10 mins               PT Short Term Goals -  12/05/18 1549      PT SHORT TERM GOAL #1   Title  Episodes of "buckling" will decrease to one time a day.     Time  4    Period  Weeks    Status  New      PT SHORT TERM GOAL #2   Title  Pt will report he is doing strengthening exercise daily.     Time  4    Period  Weeks    Status  New      PT SHORT TERM GOAL #3   Title  Pt will be able to complete or attempt to complete all tasks fo teh BERG balace assessment indicating a decrease in movment anxiety         PT Long Term Goals - 12/05/18 1551      PT LONG TERM GOAL #1   Title  Pt will improve sit to stand x 30sec from 21" surface to 8 times     Baseline  09/28/2018  7 times in 30 sec. , 5 times on 12/04/2018     Time  8    Period  Weeks    Status  New      PT LONG TERM GOAL #2   Title  Pt will improve Berg Balance Assessment  to at least 35    Baseline  29 on re-eval       PT LONG TERM GOAL #3   Title  Pt will report he is able to go up and down the steps to enter his home without supervision     Time  8    Period  Weeks    Status  New      PT LONG TERM GOAL #4   Title  pt will be able to walk for 6 minutes without a rest break     Time  8    Period  Weeks            Plan - 12/22/18 1155    Clinical Impression Statement  Pt did well with exercise today and was able to sustain acivitiy longer without rest break.  He found much benefit with walking with shoe on right leg and only sock on left to even out leg lengths.  He will look into insurance coverage and getting a built up shoe .    PT Next Visit Plan  continue with UBE, nu step and walking with even leg length cont staggered stand weight shift, hip hinge, overhead elevation with weight . Progresive Strengthening to UE, core and LE especially hip abduction bilaterally; cont standing balance and weight shift. short range sit to stand from high table, cont Nu step and can add UBE, and UE strengthening with cable machine    Consulted and Agree with Plan of Care   Patient       Patient will benefit from skilled therapeutic intervention in order to improve the following deficits and impairments:  Abnormal gait, Decreased activity tolerance, Decreased endurance, Decreased strength, Decreased balance, Difficulty walking, Cardiopulmonary status limiting activity, Impaired perceived functional ability, Obesity, Postural dysfunction  Visit Diagnosis: Other abnormalities of gait and mobility  History of falling  Muscle weakness (generalized)     Problem List Patient Active Problem List   Diagnosis Date Noted  . Generalized weakness 11/08/2018  . Well adult exam 10/02/2018  . Counseling regarding advance care planning and goals of care 07/09/2018  . Diffuse large B-cell lymphoma ([place]) 06/23/2018  . Port-A-Cath in place 06/23/2018  . Hypogonadism in male 11/22/2017  . Nephrolithiasis 10/24/2017  . Recurrent dislocation of left patella 07/15/2017  . Recurrent subluxation of patella, left 07/08/2017  . S/P revision of total knee 04/11/2017  . Failed total knee, left, initial encounter ([place]) 04/09/2017  . Renal calculus, right 02/08/2017  . Protein-calorie malnutrition, severe ([place]) 12/08/2016  . Chronic obstructive pulmonary disease ([place]) 12/08/2016  . Staghorn renal calculus 11/05/2016  . Wound drainage right hip 11/15/2013  . Expected blood loss anemia 09/18/2013  . Hyponatremia 09/18/2013  . S/P right TH revision 09/17/2013  . Nonunion, fracture, Right tibia  07/03/2013  . UTI (urinary tract infection) 07/03/2013  . Pathological fracture due to osteoporosis with delayed healing 04/24/2013  . Osteoporosis with fracture 04/24/2013  . Rash and nonspecific skin eruption 03/08/2013  . Syncope 02/21/2013  . PAF (paroxysmal atrial fibrillation) ([place]) 02/21/2013  . Chronic anticoagulation 02/21/2013  . Fall 02/08/2013  . Physical deconditioning 02/08/2013  . Low back pain radiating to both legs 02/08/2013  . Compression fracture of L1 lumbar  vertebra (HCC) 02/08/2013  . Right tibial fracture 02/05/2013  . Obesity, unspecified 02/05/2013  . Osteomyelitis of right wrist ([place]) 11/02/2012  . Anemia 10/06/2012  . HTN (hypertension) 10/03/2012  . Psoriasis 10/03/2012  . Dyslipidemia 10/03/2012  . Vitamin D deficiency 10/03/2012  . GERD (gastroesophageal reflux disease) 10/03/2012  . Hyperglycemia 10/03/2012  . Anxiety 10/03/2012  . Depression 10/03/2012  . CAD (coronary artery disease) 10/03/2012  . DJD (degenerative joint disease) 10/03/2012  . Chronic gingivitis 10/03/2012  . Low testosterone, possible hypogonadism 10/01/2012  . Normocytic anemia 09/30/2012  . Right femoral fracture ([place]) 09/29/2012  . Right forearm fracture 09/29/2012  . Atrial fibrillation ([place]) 09/29/2012  . Hypothyroidism 09/29/2012  . History of recurrent UTIs 09/29/2012   Donato Heinz. Owens Shark, PT  [place] Levo 12/22/2018, 11:58 AM  [place]  [place], [place], [address] Phone: [phone]   Fax:  [phone]  Name: ERROL ALA MRN: [id number] Date of Birth: September 24, 1946

## 2018-12-26 ENCOUNTER — Encounter: Payer: Medicare Other | Admitting: Physical Therapy

## 2018-12-27 ENCOUNTER — Ambulatory Visit: Payer: Medicare Other | Admitting: Rehabilitation

## 2018-12-27 ENCOUNTER — Other Ambulatory Visit: Payer: Self-pay

## 2018-12-27 ENCOUNTER — Encounter: Payer: Self-pay | Admitting: Rehabilitation

## 2018-12-27 DIAGNOSIS — Z9181 History of falling: Secondary | ICD-10-CM

## 2018-12-27 DIAGNOSIS — R2689 Other abnormalities of gait and mobility: Secondary | ICD-10-CM | POA: Diagnosis not present

## 2018-12-27 DIAGNOSIS — C8330 Diffuse large B-cell lymphoma, unspecified site: Secondary | ICD-10-CM

## 2018-12-27 DIAGNOSIS — M6281 Muscle weakness (generalized): Secondary | ICD-10-CM

## 2018-12-27 NOTE — Therapy (Signed)
[place], [place], [address] Phone: [phone]   Fax:  [phone]  Physical Therapy Treatment  Patient Details  Name: [name] MRN: [id number] Date of Birth: [birth year] Referring Provider (PT): Dr. Irene Limbo   Encounter Date: 12/27/2018  PT End of Session - 12/27/18 0937    Visit Number  23    Number of Visits  40    Date for PT Re-Evaluation  02/03/19    PT Start Time  0933    PT Stop Time  1015    PT Time Calculation (min)  42 min    Activity Tolerance  Patient tolerated treatment well    Behavior During Therapy  [facility] for tasks assessed/performed       Past Medical History:  Diagnosis Date  . A-fib (HCC)    hx.- sinus rhythm now  . Acid reflux   . AKI (acute kidney injury) (HCC)    mild  . Anxiety   . Arthritis   . Atrial dilatation, bilateral    Severly, enlargement  per ECHO 11/03/16  . BPH (benign prostatic hyperplasia)   . CAD (coronary artery disease)   . Cancer ([place])   . Depression   . Dislocation of internal right hip prosthesis, sequela   . Dysrhythmia    hx a fib  . Elevated brain natriuretic peptide (BNP) level   . Environmental allergies   . Fracture of wrist 10/23/13   LEFT  . High cholesterol   . History of Graves' disease   . History of kidney stones    surg. removal 02/2017  . History of syncope   . HLD (hyperlipidemia)   . Hypertension   . Hypothyroidism    thyroid removed  . Left knee injury    cap dislocated  . Low testosterone, possible hypogonadism 10/01/2012  . Lumbago   . Metabolic bone disease   . Multiple falls   . Osteoporosis    Severe  . Other abnormalities of gait and mobility   . Plaque psoriasis   . Sleep apnea    cpap machine-settings 17  . Thrombocytopenia ([place])   . Thyroid disease    HX GRAVES DISEASE  . Transfusion history    s/p 12'13 hip surgery  . Unsteady gait   . UTI (urinary tract infection)   . Vertebral compression  fracture (HCC)    L1-wears brace    Past Surgical History:  Procedure Laterality Date  . APPENDECTOMY    . CARDIAC CATHETERIZATION  2013  . CATARACT EXTRACTION, BILATERAL    . COLONOSCOPY    . CYSTOSCOPY WITH RETROGRADE PYELOGRAM, URETEROSCOPY AND STENT PLACEMENT Left 11/07/2017   Procedure: CYSTOSCOPY WITH RETROGRADE PYELOGRAM, URETEROSCOPY AND STENT PLACEMENT;  Surgeon: Cleon Gustin, MD;  Location: [facility];  Service: Urology;  Laterality: Left;  . CYSTOSCOPY/URETEROSCOPY/HOLMIUM LASER/STENT PLACEMENT Right 03/11/2017   Procedure: CYSTOSCOPY/URETEROSCOPYSTENT PLACEMENT right ureter retrograde pylegram;  Surgeon: Cleon Gustin, MD;  Location: [facility];  Service: Urology;  Laterality: Right;  . HARDWARE REMOVAL  10/05/2012   Procedure: HARDWARE REMOVAL;  Surgeon: [name], MD;  Location: [facility];  Service: Orthopedics;  Laterality: Right;  REMOVING  STRYKER  GAMMA NAIL  . HARDWARE REMOVAL Right 07/03/2013   Procedure: HARDWARE REMOVAL RIGHT TIBIA ;  Surgeon: Rozanna Box, MD;  Location: [place];  Service: Orthopedics;  Laterality: Right;  . HIP CLOSED REDUCTION Right 10/15/2013   Procedure: CLOSED MANIPULATION HIP;  Surgeon: [name], MD;  Location: [facility];  Service: Orthopedics;  Laterality: Right;  . hip revision     June 2017  . HOLMIUM LASER APPLICATION Right 12/17/7562   Procedure: HOLMIUM LASER APPLICATION;  Surgeon: Cleon Gustin, MD;  Location: [facility];  Service: Urology;  Laterality: Right;  . INCISION AND DRAINAGE HIP Right 11/16/2013   Procedure: IRRIGATION AND DEBRIDEMENT RIGHT HIP;  Surgeon: [name], MD;  Location: [facility];  Service: Orthopedics;  Laterality: Right;  . IR IMAGING GUIDED PORT INSERTION  06/22/2018  . IR URETERAL STENT LEFT NEW ACCESS W/O SEP NEPHROSTOMY CATH  10/24/2017  . JOINT REPLACEMENT     hip-right x2  . KNEE SURGERY Bilateral 2012   Total knee replacements  . NEPHROLITHOTOMY Right 02/08/2017   Procedure: NEPHROLITHOTOMY  PERCUTANEOUS WITH SURGEON ACCESS;  Surgeon: Cleon Gustin, MD;  [facility];  Service: Urology;  Laterality: Right;  . NEPHROLITHOTOMY Left 10/24/2017   Procedure: NEPHROLITHOTOMY PERCUTANEOUS;  Surgeon: Cleon Gustin, MD;  [facility];  Service: Urology;  Laterality: Left;  . ORIF TIBIA FRACTURE Right 02/06/2013   Procedure: OPEN REDUCTION INTERNAL FIXATION (ORIF) TIBIA FRACTURE WITH IM ROD FIBULA;  Surgeon: Rozanna Box, MD;  Location: [place];  Service: Orthopedics;  Laterality: Right;  . ORIF TIBIA FRACTURE Right 07/03/2013   Procedure: RIGHT TIBIA NON UNION REPAIR ;  Surgeon: Rozanna Box, MD;  Location: [place];  Service: Orthopedics;  Laterality: Right;  . ORIF WRIST FRACTURE  10/02/2012   Procedure: OPEN REDUCTION INTERNAL FIXATION (ORIF) WRIST FRACTURE;  Surgeon: Roseanne Kaufman, MD;  [facility];  Service: Orthopedics;  Laterality: Right;  WITH   ANTIBIOTIC  CEMENT  . ORIF WRIST FRACTURE Left 10/28/2013   Procedure: OPEN REDUCTION INTERNAL FIXATION (ORIF) WRIST FRACTURE with allograft;  Surgeon: Roseanne Kaufman, MD;  [facility];  Service: Orthopedics;  Laterality: Left;  DVR Plate  . QUADRICEPS TENDON REPAIR Left 07/15/2017   Procedure: REPAIR QUADRICEP TENDON;  Surgeon: Frederik Pear, MD;  Location: [place];  Service: Orthopedics;  Laterality: Left;  . right femur surgery  05/2012  . THYROIDECTOMY  02/1986  . TOTAL HIP REVISION  10/05/2012   Procedure: TOTAL HIP REVISION;  Surgeon: [name], MD;  [facility];  Service: Orthopedics;  Laterality: Right;  RIGHT TOTAL HIP REVISION  . TOTAL HIP REVISION Right 09/17/2013   Procedure: REVISION RIGHT TOTAL HIP ARTHROPLASTY ;  Surgeon: [name], MD;  [facility];  Service: Orthopedics;  Laterality: Right;  . TOTAL HIP REVISION Right 10/26/2013   Procedure: REVISION RIGHT TOTAL HIP ARTHROPLASTY;  Surgeon: [name], MD;  Location[facility];  Service: Orthopedics;  Laterality: Right;  . TOTAL HIP  REVISION  03/2016  . TOTAL KNEE REVISION Left 04/11/2017   Procedure: TOTAL KNEE REVISION PATELLA and TIBIA;  Surgeon: Frederik Pear, MD;  Location: [place];  Service: Orthopedics;  Laterality: Left;  . WRIST FRACTURE SURGERY  05/2012    There were no vitals filed for this visit.  Subjective Assessment - 12/27/18 0931    Subjective  This is just early for me     Pertinent History  2013 fall resulting in a femur break on the Rt leg resulting in multiple surgeries due to failures including hip replacements another broken tibia leading to the discovery of osteoporosis and a metabolic bone disease.  After forteo injections x 2 years he is now in the osteopenia category.  Has a new scan coming up in  a few weeks.  prior to the cancer diagnosis he was able to be confident with a cane.  Then was diagnosed with diffuse non-hogkin large cell B-lymphoma 07/09/18 located in tonsils and Rt groin. Treatment consists of an infusion every 3 weeks hopefully for a total of 5, A-fib controlled, HTN controlled with medication    Patient Stated Goals  get my balance back    Currently in Pain?  Yes    Pain Score  3     Pain Location  Leg   bil   Pain Orientation  Right;Left    Pain Descriptors / Indicators  Aching    Pain Type  Chronic pain    Pain Onset  More than a month ago    Pain Frequency  Intermittent                       [facility] Adult PT Treatment/Exercise - 12/27/18 0001      Ambulation/Gait   Ambulation/Gait  Yes    Ambulation/Gait Assistance  6: Modified independent (Device/Increase time)    Ambulation Distance (Feet)  300 Feet    Assistive device  Rolling walker    Pre-Gait Activities  in parallel bars: standing without holding 3x30", staggered stance weight shift Fwd/Back x 10 each holding on and vcs for less weight on the hands, toe taps onto cone x 10 Lt/Rt and lateral taps R/L x 10 with consistent vcs to stand upright and to not use ankle motion to touch vs hip flexion, lateral steps  x 2 lengths holding 2 hands,     Gait Comments  focus on gait endurance and standing upright      Knee/Hip Exercises: Aerobic   Nustep  Level 5x10 min with cueing to increase knee extension      Knee/Hip Exercises: Seated   Sit to Sand  10 reps   on ortho high table vcs to slow descent               PT Short Term Goals - 12/05/18 1549      PT SHORT TERM GOAL #1   Title  Episodes of "buckling" will decrease to one time a day.     Time  4    Period  Weeks    Status  New      PT SHORT TERM GOAL #2   Title  Pt will report he is doing strengthening exercise daily.     Time  4    Period  Weeks    Status  New      PT SHORT TERM GOAL #3   Title  Pt will be able to complete or attempt to complete all tasks fo teh BERG balace assessment indicating a decrease in movment anxiety         PT Long Term Goals - 12/05/18 1551      PT LONG TERM GOAL #1   Title  Pt will improve sit to stand x 30sec from 21" surface to 8 times     Baseline  09/28/2018  7 times in 30 sec. , 5 times on 12/04/2018     Time  8    Period  Weeks    Status  New      PT LONG TERM GOAL #2   Title  Pt will improve Berg Balance Assessment  to at least 35    Baseline  29 on re-eval       PT LONG TERM GOAL #3  Title  Pt will report he is able to go up and down the steps to enter his home without supervision     Time  8    Period  Weeks    Status  New      PT LONG TERM GOAL #4   Title  pt will be able to walk for 6 minutes without a rest break     Time  8    Period  Weeks            Plan - 12/27/18 1006    Clinical Impression Statement  Pt tired today but did all required exercises rest breaks x 3 throughout.  Focus on LE movement with core and balance. leg giving away with fatigue during side steps and when attempting to balance without hands        Patient will benefit from skilled therapeutic intervention in order to improve the following deficits and impairments:  Abnormal gait,  Decreased activity tolerance, Decreased endurance, Decreased strength, Decreased balance, Difficulty walking, Cardiopulmonary status limiting activity, Impaired perceived functional ability, Obesity, Postural dysfunction  Visit Diagnosis: Other abnormalities of gait and mobility  History of falling  Muscle weakness (generalized)  Diffuse large B-cell lymphoma, unspecified body region [facility])     Problem List Patient Active Problem List   Diagnosis Date Noted  . Generalized weakness 11/08/2018  . Well adult exam 10/02/2018  . Counseling regarding advance care planning and goals of care 07/09/2018  . Diffuse large B-cell lymphoma ([place]) 06/23/2018  . Port-A-Cath in place 06/23/2018  . Hypogonadism in male 11/22/2017  . Nephrolithiasis 10/24/2017  . Recurrent dislocation of left patella 07/15/2017  . Recurrent subluxation of patella, left 07/08/2017  . S/P revision of total knee 04/11/2017  . Failed total knee, left, initial encounter ([place]) 04/09/2017  . Renal calculus, right 02/08/2017  . Protein-calorie malnutrition, severe ([place]) 12/08/2016  . Chronic obstructive pulmonary disease ([place]) 12/08/2016  . Staghorn renal calculus 11/05/2016  . Wound drainage right hip 11/15/2013  . Expected blood loss anemia 09/18/2013  . Hyponatremia 09/18/2013  . S/P right TH revision 09/17/2013  . Nonunion, fracture, Right tibia  07/03/2013  . UTI (urinary tract infection) 07/03/2013  . Pathological fracture due to osteoporosis with delayed healing 04/24/2013  . Osteoporosis with fracture 04/24/2013  . Rash and nonspecific skin eruption 03/08/2013  . Syncope 02/21/2013  . PAF (paroxysmal atrial fibrillation) ([place]) 02/21/2013  . Chronic anticoagulation 02/21/2013  . Fall 02/08/2013  . Physical deconditioning 02/08/2013  . Low back pain radiating to both legs 02/08/2013  . Compression fracture of L1 lumbar vertebra (HCC) 02/08/2013  . Right tibial fracture 02/05/2013  . Obesity, unspecified  02/05/2013  . Osteomyelitis of right wrist ([place]) 11/02/2012  . Anemia 10/06/2012  . HTN (hypertension) 10/03/2012  . Psoriasis 10/03/2012  . Dyslipidemia 10/03/2012  . Vitamin D deficiency 10/03/2012  . GERD (gastroesophageal reflux disease) 10/03/2012  . Hyperglycemia 10/03/2012  . Anxiety 10/03/2012  . Depression 10/03/2012  . CAD (coronary artery disease) 10/03/2012  . DJD (degenerative joint disease) 10/03/2012  . Chronic gingivitis 10/03/2012  . Low testosterone, possible hypogonadism 10/01/2012  . Normocytic anemia 09/30/2012  . Right femoral fracture ([place]) 09/29/2012  . Right forearm fracture 09/29/2012  . Atrial fibrillation ([place]) 09/29/2012  . Hypothyroidism 09/29/2012  . History of recurrent UTIs 09/29/2012    Shan Levans, PT 12/27/2018, 10:24 AM  [place], [place], [address] Phone: [phone]   Fax:  [phone]  Name: [name] MRN: [id number] Date of Birth: 02/24/1946

## 2018-12-28 ENCOUNTER — Encounter: Payer: Medicare Other | Admitting: Physical Therapy

## 2018-12-28 ENCOUNTER — Ambulatory Visit: Payer: Medicare Other

## 2018-12-28 ENCOUNTER — Other Ambulatory Visit: Payer: Self-pay

## 2018-12-28 DIAGNOSIS — R2689 Other abnormalities of gait and mobility: Secondary | ICD-10-CM

## 2018-12-28 DIAGNOSIS — M6281 Muscle weakness (generalized): Secondary | ICD-10-CM

## 2018-12-28 DIAGNOSIS — Z9181 History of falling: Secondary | ICD-10-CM

## 2018-12-28 DIAGNOSIS — C8330 Diffuse large B-cell lymphoma, unspecified site: Secondary | ICD-10-CM

## 2018-12-28 NOTE — Therapy (Signed)
[place], [place], [address] Phone: [phone]   Fax:  [phone]  Physical Therapy Treatment  Patient Details  Name: [name] MRN: [id number] Date of Birth: September 28, 1946 Referring Provider (PT): Dr. Irene Limbo   Encounter Date: 12/28/2018  PT End of Session - 12/28/18 1056    Visit Number  24    Number of Visits  40    Date for PT Re-Evaluation  02/03/19    PT Start Time  0623    PT Stop Time  1057   pt reports needed to leave early for another appt   PT Time Calculation (min)  34 min    Activity Tolerance  Patient tolerated treatment well    Behavior During Therapy  [facility] for tasks assessed/performed       Past Medical History:  Diagnosis Date  . A-fib (HCC)    hx.- sinus rhythm now  . Acid reflux   . AKI (acute kidney injury) (HCC)    mild  . Anxiety   . Arthritis   . Atrial dilatation, bilateral    Severly, enlargement  per ECHO 11/03/16  . BPH (benign prostatic hyperplasia)   . CAD (coronary artery disease)   . Cancer ([place])   . Depression   . Dislocation of internal right hip prosthesis, sequela   . Dysrhythmia    hx a fib  . Elevated brain natriuretic peptide (BNP) level   . Environmental allergies   . Fracture of wrist 10/23/13   LEFT  . High cholesterol   . History of Graves' disease   . History of kidney stones    surg. removal 02/2017  . History of syncope   . HLD (hyperlipidemia)   . Hypertension   . Hypothyroidism    thyroid removed  . Left knee injury    cap dislocated  . Low testosterone, possible hypogonadism 10/01/2012  . Lumbago   . Metabolic bone disease   . Multiple falls   . Osteoporosis    Severe  . Other abnormalities of gait and mobility   . Plaque psoriasis   . Sleep apnea    cpap machine-settings 17  . Thrombocytopenia ([place])   . Thyroid disease    HX GRAVES DISEASE  . Transfusion history    s/p 12'13 hip surgery  . Unsteady gait   . UTI  (urinary tract infection)   . Vertebral compression fracture (HCC)    L1-wears brace    Past Surgical History:  Procedure Laterality Date  . APPENDECTOMY    . CARDIAC CATHETERIZATION  2013  . CATARACT EXTRACTION, BILATERAL    . COLONOSCOPY    . CYSTOSCOPY WITH RETROGRADE PYELOGRAM, URETEROSCOPY AND STENT PLACEMENT Left 11/07/2017   Procedure: CYSTOSCOPY WITH RETROGRADE PYELOGRAM, URETEROSCOPY AND STENT PLACEMENT;  Surgeon: Cleon Gustin, MD;  Location: [facility];  Service: Urology;  Laterality: Left;  . CYSTOSCOPY/URETEROSCOPY/HOLMIUM LASER/STENT PLACEMENT Right 03/11/2017   Procedure: CYSTOSCOPY/URETEROSCOPYSTENT PLACEMENT right ureter retrograde pylegram;  Surgeon: Cleon Gustin, MD;  Location: [facility];  Service: Urology;  Laterality: Right;  . HARDWARE REMOVAL  10/05/2012   Procedure: HARDWARE REMOVAL;  Surgeon: [name], MD;  Location[facility];  Service: Orthopedics;  Laterality: Right;  REMOVING  STRYKER  GAMMA NAIL  . HARDWARE REMOVAL Right 07/03/2013   Procedure: HARDWARE REMOVAL RIGHT TIBIA ;  Surgeon: Rozanna Box, MD;  Location: [place];  Service: Orthopedics;  Laterality: Right;  . HIP CLOSED REDUCTION Right 10/15/2013  Procedure: CLOSED MANIPULATION HIP;  Surgeon: [name], MD;  [facility];  Service: Orthopedics;  Laterality: Right;  . hip revision     June 2017  . HOLMIUM LASER APPLICATION Right 0/11/7739   Procedure: HOLMIUM LASER APPLICATION;  Surgeon: Cleon Gustin, MD;  [facility];  Service: Urology;  Laterality: Right;  . INCISION AND DRAINAGE HIP Right 11/16/2013   Procedure: IRRIGATION AND DEBRIDEMENT RIGHT HIP;  Surgeon: [name], MD;  [facility];  Service: Orthopedics;  Laterality: Right;  . IR IMAGING GUIDED PORT INSERTION  06/22/2018  . IR URETERAL STENT LEFT NEW ACCESS W/O SEP NEPHROSTOMY CATH  10/24/2017  . JOINT REPLACEMENT     hip-right x2  . KNEE SURGERY Bilateral 2012   Total knee replacements  . NEPHROLITHOTOMY  Right 02/08/2017   Procedure: NEPHROLITHOTOMY PERCUTANEOUS WITH SURGEON ACCESS;  Surgeon: Cleon Gustin, MD;  [facility];  Service: Urology;  Laterality: Right;  . NEPHROLITHOTOMY Left 10/24/2017   Procedure: NEPHROLITHOTOMY PERCUTANEOUS;  Surgeon: Cleon Gustin, MD;  [facility];  Service: Urology;  Laterality: Left;  . ORIF TIBIA FRACTURE Right 02/06/2013   Procedure: OPEN REDUCTION INTERNAL FIXATION (ORIF) TIBIA FRACTURE WITH IM ROD FIBULA;  Surgeon: Rozanna Box, MD;  Location: [place];  Service: Orthopedics;  Laterality: Right;  . ORIF TIBIA FRACTURE Right 07/03/2013   Procedure: RIGHT TIBIA NON UNION REPAIR ;  Surgeon: Rozanna Box, MD;  Location: [place];  Service: Orthopedics;  Laterality: Right;  . ORIF WRIST FRACTURE  10/02/2012   Procedure: OPEN REDUCTION INTERNAL FIXATION (ORIF) WRIST FRACTURE;  Surgeon: Roseanne Kaufman, MD;  [facility];  Service: Orthopedics;  Laterality: Right;  WITH   ANTIBIOTIC  CEMENT  . ORIF WRIST FRACTURE Left 10/28/2013   Procedure: OPEN REDUCTION INTERNAL FIXATION (ORIF) WRIST FRACTURE with allograft;  Surgeon: Roseanne Kaufman, MD;  [facility];  Service: Orthopedics;  Laterality: Left;  DVR Plate  . QUADRICEPS TENDON REPAIR Left 07/15/2017   Procedure: REPAIR QUADRICEP TENDON;  Surgeon: Frederik Pear, MD;  Location: [place];  Service: Orthopedics;  Laterality: Left;  . right femur surgery  05/2012  . THYROIDECTOMY  02/1986  . TOTAL HIP REVISION  10/05/2012   Procedure: TOTAL HIP REVISION;  Surgeon: [name], MD;  [facility];  Service: Orthopedics;  Laterality: Right;  RIGHT TOTAL HIP REVISION  . TOTAL HIP REVISION Right 09/17/2013   Procedure: REVISION RIGHT TOTAL HIP ARTHROPLASTY ;  Surgeon: [name], MD;  [facility];  Service: Orthopedics;  Laterality: Right;  . TOTAL HIP REVISION Right 10/26/2013   Procedure: REVISION RIGHT TOTAL HIP ARTHROPLASTY;  Surgeon: [name], MD;  [facility];  Service:  Orthopedics;  Laterality: Right;  . TOTAL HIP REVISION  03/2016  . TOTAL KNEE REVISION Left 04/11/2017   Procedure: TOTAL KNEE REVISION PATELLA and TIBIA;  Surgeon: Frederik Pear, MD;  Location: [place];  Service: Orthopedics;  Laterality: Left;  . WRIST FRACTURE SURGERY  05/2012    There were no vitals filed for this visit.  Subjective Assessment - 12/28/18 1025    Subjective  I'm really feeling the new exercises (standing) exercises we did yesterday.     Pertinent History  2013 fall resulting in a femur break on the Rt leg resulting in multiple surgeries due to failures including hip replacements another broken tibia leading to the discovery of osteoporosis and a metabolic bone disease.  After forteo injections x 2  years he is now in the osteopenia category.  Has a new scan coming up in a few weeks.  prior to the cancer diagnosis he was able to be confident with a cane.  Then was diagnosed with diffuse non-hogkin large cell B-lymphoma 07/09/18 located in tonsils and Rt groin. Treatment consists of an infusion every 3 weeks hopefully for a total of 5, A-fib controlled, HTN controlled with medication    Patient Stated Goals  get my balance back    Currently in Pain?  Yes    Pain Score  4     Pain Location  Leg    Pain Orientation  Right    Pain Descriptors / Indicators  Aching;Sore    Pain Type  Chronic pain    Pain Onset  More than a month ago    Pain Frequency  Intermittent    Aggravating Factors   new exercises just made me sore    Pain Relieving Factors  getting weight off it                       [facility] Adult PT Treatment/Exercise - 12/28/18 0001      Neuro Re-ed    Neuro Re-ed Details   In // bars: Slow, controlled high knee marching 2x, then bil sidestepping 2x each way with seated rest between sets using +2 HHA leaning heavily on bars; tried toe walking but pt unable today; standing hip 3 way raises on blue airex x10 each, then mini squats 3x10 with demo and VCs for  correct technique. 3 seated rests during      Knee/Hip Exercises: Aerobic   Nustep  Level 5, x 10 mins               PT Short Term Goals - 12/05/18 1549      PT SHORT TERM GOAL #1   Title  Episodes of "buckling" will decrease to one time a day.     Time  4    Period  Weeks    Status  New      PT SHORT TERM GOAL #2   Title  Pt will report he is doing strengthening exercise daily.     Time  4    Period  Weeks    Status  New      PT SHORT TERM GOAL #3   Title  Pt will be able to complete or attempt to complete all tasks fo teh BERG balace assessment indicating a decrease in movment anxiety         PT Long Term Goals - 12/05/18 1551      PT LONG TERM GOAL #1   Title  Pt will improve sit to stand x 30sec from 21" surface to 8 times     Baseline  09/28/2018  7 times in 30 sec. , 5 times on 12/04/2018     Time  8    Period  Weeks    Status  New      PT LONG TERM GOAL #2   Title  Pt will improve Berg Balance Assessment  to at least 35    Baseline  29 on re-eval       PT LONG TERM GOAL #3   Title  Pt will report he is able to go up and down the steps to enter his home without supervision     Time  8    Period  Weeks    Status  New      PT LONG TERM GOAL #4   Title  pt will be able to walk for 6 minutes without a rest break     Time  8    Period  Weeks            Plan - 12/28/18 1057    Clinical Impression Statement  Pt had some residual soreness from new sit to stand exercise yesterday but repotrs overall feeling good. Pt was unable to stay longer at end of session to get a full session due to having another appt to get too, but he worked hard and reported was fatigued and reay to end session anyways due to fatigue. Continued with balance activities in gym and and NuStep. Pt will be on  hold over next 2 (at least) weeks due to clinic wil be closed due to [place].     Rehab Potential  Good    Clinical Impairments Affecting Rehab Potential  history of Rt hip  fractures and functional impairment     PT Frequency  2x / week    PT Duration  8 weeks    PT Treatment/Interventions  ADLs/Self Care Home Management;Therapeutic exercise;Patient/family education;Gait training;Stair training;Balance training;Neuromuscular re-education    PT Next Visit Plan  Reassess goals; continue with UBE, nu step and walking with even leg length cont staggered stand weight shift, hip hinge, overhead elevation with weight . Progresive Strengthening to UE, core and LE especially hip abduction bilaterally; cont standing balance and weight shift. short range sit to stand from high table, cont Nu step and can add UBE, and UE strengthening with cable machine    Consulted and Agree with Plan of Care  Patient       Patient will benefit from skilled therapeutic intervention in order to improve the following deficits and impairments:  Abnormal gait, Decreased activity tolerance, Decreased endurance, Decreased strength, Decreased balance, Difficulty walking, Cardiopulmonary status limiting activity, Impaired perceived functional ability, Obesity, Postural dysfunction  Visit Diagnosis: Other abnormalities of gait and mobility  History of falling  Muscle weakness (generalized)  Diffuse large B-cell lymphoma, unspecified body region [facility])     Problem List Patient Active Problem List   Diagnosis Date Noted  . Generalized weakness 11/08/2018  . Well adult exam 10/02/2018  . Counseling regarding advance care planning and goals of care 07/09/2018  . Diffuse large B-cell lymphoma ([place]) 06/23/2018  . Port-A-Cath in place 06/23/2018  . Hypogonadism in male 11/22/2017  . Nephrolithiasis 10/24/2017  . Recurrent dislocation of left patella 07/15/2017  . Recurrent subluxation of patella, left 07/08/2017  . S/P revision of total knee 04/11/2017  . Failed total knee, left, initial encounter ([place]) 04/09/2017  . Renal calculus, right 02/08/2017  . Protein-calorie malnutrition, severe  ([place]) 12/08/2016  . Chronic obstructive pulmonary disease ([place]) 12/08/2016  . Staghorn renal calculus 11/05/2016  . Wound drainage right hip 11/15/2013  . Expected blood loss anemia 09/18/2013  . Hyponatremia 09/18/2013  . S/P right TH revision 09/17/2013  . Nonunion, fracture, Right tibia  07/03/2013  . UTI (urinary tract infection) 07/03/2013  . Pathological fracture due to osteoporosis with delayed healing 04/24/2013  . Osteoporosis with fracture 04/24/2013  . Rash and nonspecific skin eruption 03/08/2013  . Syncope 02/21/2013  . PAF (paroxysmal atrial fibrillation) ([place]) 02/21/2013  . Chronic anticoagulation 02/21/2013  . Fall 02/08/2013  . Physical deconditioning 02/08/2013  . Low back pain radiating to both legs 02/08/2013  . Compression fracture of L1 lumbar vertebra (HCC)  02/08/2013  . Right tibial fracture 02/05/2013  . Obesity, unspecified 02/05/2013  . Osteomyelitis of right wrist ([place]) 11/02/2012  . Anemia 10/06/2012  . HTN (hypertension) 10/03/2012  . Psoriasis 10/03/2012  . Dyslipidemia 10/03/2012  . Vitamin D deficiency 10/03/2012  . GERD (gastroesophageal reflux disease) 10/03/2012  . Hyperglycemia 10/03/2012  . Anxiety 10/03/2012  . Depression 10/03/2012  . CAD (coronary artery disease) 10/03/2012  . DJD (degenerative joint disease) 10/03/2012  . Chronic gingivitis 10/03/2012  . Low testosterone, possible hypogonadism 10/01/2012  . Normocytic anemia 09/30/2012  . Right femoral fracture ([place]) 09/29/2012  . Right forearm fracture 09/29/2012  . Atrial fibrillation ([place]) 09/29/2012  . Hypothyroidism 09/29/2012  . History of recurrent UTIs 09/29/2012    Otelia Limes, PTA 12/28/2018, 11:04 AM  [place], [place], [address] Phone: [phone]   Fax:  [phone]  Name: [name] MRN: [id number] Date of Birth: [birth year]

## 2018-12-29 ENCOUNTER — Ambulatory Visit: Payer: Medicare Other | Admitting: Physical Therapy

## 2019-01-01 ENCOUNTER — Ambulatory Visit: Payer: Medicare Other | Admitting: Physical Therapy

## 2019-01-02 ENCOUNTER — Encounter: Payer: Medicare Other | Admitting: Physical Therapy

## 2019-01-03 ENCOUNTER — Other Ambulatory Visit: Payer: Self-pay | Admitting: *Deleted

## 2019-01-03 NOTE — Telephone Encounter (Signed)
I called pt Re: 01/04/19 3 mo f/u with PCP and due to COVID-19, patient wants to reschedule. OV r/s to 03/07/19 @ 3:00pm.   Patient is requesting refills x 3 months if possible for Hydrocodone/APAP 7.5-325 mg sent to [facility]. Please advise.

## 2019-01-04 ENCOUNTER — Encounter: Payer: Medicare Other | Admitting: Physical Therapy

## 2019-01-04 ENCOUNTER — Ambulatory Visit: Payer: Medicare Other | Admitting: Internal Medicine

## 2019-01-04 MED ORDER — HYDROCODONE-ACETAMINOPHEN 7.5-325 MG PO TABS: 1.0000 | ORAL_TABLET | Freq: Four times a day (QID) | ORAL | 0 refills | Status: DC | PRN

## 2019-01-04 NOTE — Telephone Encounter (Signed)
Pt informed Rx sent to [facility].

## 2019-01-08 ENCOUNTER — Encounter: Payer: Medicare Other | Admitting: Rehabilitation

## 2019-01-09 ENCOUNTER — Telehealth: Payer: Self-pay | Admitting: Physical Therapy

## 2019-01-09 ENCOUNTER — Encounter: Payer: Medicare Other | Admitting: Physical Therapy

## 2019-01-09 NOTE — Telephone Encounter (Signed)
Left message for pt saying we would call later in the week to talk about how his exercise was going.  Maudry Diego, PT 01/09/2019@ 2:05 PM

## 2019-01-10 ENCOUNTER — Telehealth: Payer: Self-pay | Admitting: Physical Therapy

## 2019-01-10 NOTE — Telephone Encounter (Signed)
Called pt again today and left a message letting him know we were thinking of him and encouraging him to continue his home program. I explained to him that we were closed due to the coronavirus and that we were working the possibility of telehealth visits.  I asked him to call our office at [phone] if had any questions or would like to do a telehealth visit.  If not, we will contact him again when we reopen  Maudry Diego, PT 01/10/2019@ 11:51 AM

## 2019-01-11 ENCOUNTER — Encounter: Payer: Medicare Other | Admitting: Physical Therapy

## 2019-01-15 ENCOUNTER — Ambulatory Visit: Payer: Medicare Other

## 2019-01-15 ENCOUNTER — Other Ambulatory Visit: Payer: Self-pay | Admitting: Cardiovascular Disease

## 2019-01-15 MED ORDER — ATORVASTATIN CALCIUM 80 MG PO TABS: 80.0000 mg | ORAL_TABLET | Freq: Every day | ORAL | 1 refills | Status: DC

## 2019-01-15 NOTE — Telephone Encounter (Signed)
° ° °*  STAT* If patient is at the pharmacy, call can be transferred to refill team.   1. Which medications need to be refilled? (please list name of each medication and dose if known) atorvastatin (LIPITOR) 80 MG tablet  2. Which pharmacy/location (including street and city if local pharmacy) is medication to be sent to? [facility]  3. Do they need a 30 day or 90 day supply? [place]

## 2019-01-15 NOTE — Telephone Encounter (Signed)
Atorvastatin refilled to [place] per patient request.

## 2019-01-16 ENCOUNTER — Encounter: Payer: Medicare Other | Admitting: Physical Therapy

## 2019-01-17 ENCOUNTER — Encounter: Payer: Medicare Other | Admitting: Physical Therapy

## 2019-01-23 ENCOUNTER — Encounter: Payer: Medicare Other | Admitting: Physical Therapy

## 2019-01-24 ENCOUNTER — Ambulatory Visit: Payer: Medicare Other | Admitting: Physical Therapy

## 2019-01-25 ENCOUNTER — Other Ambulatory Visit: Payer: Self-pay | Admitting: Urology

## 2019-01-25 ENCOUNTER — Other Ambulatory Visit (HOSPITAL_COMMUNITY): Payer: Self-pay | Admitting: Urology

## 2019-01-25 ENCOUNTER — Telehealth: Payer: Self-pay | Admitting: Internal Medicine

## 2019-01-25 ENCOUNTER — Encounter: Payer: Medicare Other | Admitting: Physical Therapy

## 2019-01-25 DIAGNOSIS — N200 Calculus of kidney: Secondary | ICD-10-CM

## 2019-01-25 NOTE — Telephone Encounter (Signed)
Recv'd records from [facility] forwarded 5 pages to Dr. Alain Marion [facility] 4/16/20fbg

## 2019-01-30 ENCOUNTER — Encounter: Payer: Medicare Other | Admitting: Physical Therapy

## 2019-01-31 ENCOUNTER — Ambulatory Visit: Payer: Medicare Other | Admitting: Physical Therapy

## 2019-01-31 NOTE — Progress Notes (Signed)
HEMATOLOGY/ONCOLOGY CLINIC NOTE  Date of Service: 02/01/2019  Patient Care Team: Plotnikov, Evie Lacks, MD as PCP - General (Internal Medicine) Brunetta Genera, MD as Consulting Physician (Hematology) McKenzie, Candee Furbish, MD as Consulting Physician (Urology) Jackquline Denmark, MD as Consulting Physician (Gastroenterology)  CHIEF COMPLAINTS/PURPOSE OF CONSULTATION:  Follow Up for Diffuse Large B-Cell Lymphoma   HISTORY OF PRESENTING ILLNESS:   [name] is a wonderful 73 y.o. male who has been referred to [place] by Dr. Lew Dawes for evaluation and management of Diffuse Large B-Cell Lymphoma. He is accompanied today by his partner. The pt reports that he is doing well overall.   The pt reports that he presented to ENT Dr. Pollie Friar care after developing a cough and a new nodule in his mouth. He notes that the symptoms presented about a month ago and have not progressed or significantly worsened. He notes that he has frequent sinus drainage, and felt that this enlargement could initially be related to this. He denies any changes in his voice and difficulty breathing.   The pt notes that he is finishing three months of PT tomorrow after surgeries on his right leg and left knee. He notes that he takes blood thinners for Afib and uses his CPAP regularly now. The pt also receives testosterone injections, 200mg  every other week. The pt notes that he had several surgeries bilaterally to remove large kidney stones in the past. He notes that his thyroid replacement has been stable. His vertebral compression fracture was in 2013, after a fall in which he also broke his right femur and wrist. The pt also takes Prolia every 6 months for his osteoporosis. He has had 18 surgeries on his right leg.   He notes that his psoriasis has been stable and takes Enbrel twice a week, and has taken this for 5 years. He was on 10 years of Humara before this. He was on Methotrexate prior to [facility].   The pt  notes that he functions okay at home and is able to drive himself and cook for himself and feels that he can function independently with limitations on lifting heavier objects.   On review of systems, pt reports recent cough, left tonsil mass, stable energy levels, and denies fevers, chills, night sweats, unexpected weight loss, difficulty breathing, difficulty swallowing, noticing any other lumps or bumps, pain along the spine, recent infections, abdominal pains, difficulty urinating, leg swelling, and any other symptoms.  . On Social Hx the pt reports that he smoked cigarettes for many years until he quit 6 years ago, and drinks ETOH socially.    INTERVAL HISTORY   [name] is here for management and evaluation, after completing 6 planned cycles of R-CHOP treatment of his Diffuse Large B-Cell Lymphoma. The patient's last visit with [place] was on 11/30/18. The pt reports that he is doing well overall.  The pt reports that he had another fall about one month ago without sustaining any injuries. He is continuing to use his walker and has begun walking in his neighborhood. He is anticipating a video session with PT later today. He notes that he has been eating well and endorses stable weight.   The pt notes that he has followed up with his dermatologist Dr. Pablo Ledger and will began Otezla BID, three weeks ago. He denies changes in mood nor diarrhea.   The pt is also anticipating a nephrolithotomy on 03/26/19 and denies UTIs since our last visit.   He denies fevers,  chills, night sweat, or new lumps or bumps.  Lab results today (02/01/19) of CBC w/diff and CMP is as follows: all values are WNL except for RBC at 4.20, HGB at 12.6, Glucose at 131, Calcium at 8.8, Albumin at 3.2. 02/01/19 LDH is . Lab Results  Component Value Date   LDH 197 (H) 02/01/2019   On review of systems, pt reports good energy levels, eating well, stable weight, good appetite, and denies diarrhea, changes in mood, fevers,  chills, night sweats, new lumps or bumps, abdominal pains, and any other symptoms.   MEDICAL HISTORY:  Past Medical History:  Diagnosis Date  . A-fib (HCC)    hx.- sinus rhythm now  . Acid reflux   . AKI (acute kidney injury) (HCC)    mild  . Anxiety   . Arthritis   . Atrial dilatation, bilateral    Severly, enlargement  per ECHO 11/03/16  . BPH (benign prostatic hyperplasia)   . CAD (coronary artery disease)   . Cancer ([place])   . Depression   . Dislocation of internal right hip prosthesis, sequela   . Dysrhythmia    hx a fib  . Elevated brain natriuretic peptide (BNP) level   . Environmental allergies   . Fracture of wrist 10/23/13   LEFT  . High cholesterol   . History of Graves' disease   . History of kidney stones    surg. removal 02/2017  . History of syncope   . HLD (hyperlipidemia)   . Hypertension   . Hypothyroidism    thyroid removed  . Left knee injury    cap dislocated  . Low testosterone, possible hypogonadism 10/01/2012  . Lumbago   . Metabolic bone disease   . Multiple falls   . Osteoporosis    Severe  . Other abnormalities of gait and mobility   . Plaque psoriasis   . Sleep apnea    cpap machine-settings 17  . Thrombocytopenia ([place])   . Thyroid disease    HX GRAVES DISEASE  . Transfusion history    s/p 12'13 hip surgery  . Unsteady gait   . UTI (urinary tract infection)   . Vertebral compression fracture (HCC)    L1-wears brace    SURGICAL HISTORY: Past Surgical History:  Procedure Laterality Date  . APPENDECTOMY    . CARDIAC CATHETERIZATION  2013  . CATARACT EXTRACTION, BILATERAL    . COLONOSCOPY    . CYSTOSCOPY WITH RETROGRADE PYELOGRAM, URETEROSCOPY AND STENT PLACEMENT Left 11/07/2017   Procedure: CYSTOSCOPY WITH RETROGRADE PYELOGRAM, URETEROSCOPY AND STENT PLACEMENT;  Surgeon: Cleon Gustin, MD;  Location: [facility];  Service: Urology;  Laterality: Left;  . CYSTOSCOPY/URETEROSCOPY/HOLMIUM LASER/STENT PLACEMENT Right 03/11/2017    Procedure: CYSTOSCOPY/URETEROSCOPYSTENT PLACEMENT right ureter retrograde pylegram;  Surgeon: Cleon Gustin, MD;  [facility];  Service: Urology;  Laterality: Right;  . HARDWARE REMOVAL  10/05/2012   Procedure: HARDWARE REMOVAL;  Surgeon: [name], MD;  [facility];  Service: Orthopedics;  Laterality: Right;  REMOVING  STRYKER  GAMMA NAIL  . HARDWARE REMOVAL Right 07/03/2013   Procedure: HARDWARE REMOVAL RIGHT TIBIA ;  Surgeon: Rozanna Box, MD;  Location: [place];  Service: Orthopedics;  Laterality: Right;  . HIP CLOSED REDUCTION Right 10/15/2013   Procedure: CLOSED MANIPULATION HIP;  Surgeon: [name], MD;  [facility];  Service: Orthopedics;  Laterality: Right;  . hip revision     June 2017  . HOLMIUM LASER APPLICATION Right 01/15/8294   Procedure:  HOLMIUM LASER APPLICATION;  Surgeon: Cleon Gustin, MD;  [facility];  Service: Urology;  Laterality: Right;  . INCISION AND DRAINAGE HIP Right 11/16/2013   Procedure: IRRIGATION AND DEBRIDEMENT RIGHT HIP;  Surgeon: [name], MD;  [facility];  Service: Orthopedics;  Laterality: Right;  . IR IMAGING GUIDED PORT INSERTION  06/22/2018  . IR URETERAL STENT LEFT NEW ACCESS W/O SEP NEPHROSTOMY CATH  10/24/2017  . JOINT REPLACEMENT     hip-right x2  . KNEE SURGERY Bilateral 2012   Total knee replacements  . NEPHROLITHOTOMY Right 02/08/2017   Procedure: NEPHROLITHOTOMY PERCUTANEOUS WITH SURGEON ACCESS;  Surgeon: Cleon Gustin, MD;  [facility];  Service: Urology;  Laterality: Right;  . NEPHROLITHOTOMY Left 10/24/2017   Procedure: NEPHROLITHOTOMY PERCUTANEOUS;  Surgeon: Cleon Gustin, MD;  [facility];  Service: Urology;  Laterality: Left;  . ORIF TIBIA FRACTURE Right 02/06/2013   Procedure: OPEN REDUCTION INTERNAL FIXATION (ORIF) TIBIA FRACTURE WITH IM ROD FIBULA;  Surgeon: Rozanna Box, MD;  [facility];  Service: Orthopedics;  Laterality: Right;  . ORIF TIBIA FRACTURE Right  07/03/2013   Procedure: RIGHT TIBIA NON UNION REPAIR ;  Surgeon: Rozanna Box, MD;  Location: [place];  Service: Orthopedics;  Laterality: Right;  . ORIF WRIST FRACTURE  10/02/2012   Procedure: OPEN REDUCTION INTERNAL FIXATION (ORIF) WRIST FRACTURE;  Surgeon: Roseanne Kaufman, MD;  [facility];  Service: Orthopedics;  Laterality: Right;  WITH   ANTIBIOTIC  CEMENT  . ORIF WRIST FRACTURE Left 10/28/2013   Procedure: OPEN REDUCTION INTERNAL FIXATION (ORIF) WRIST FRACTURE with allograft;  Surgeon: Roseanne Kaufman, MD;  [facility];  Service: Orthopedics;  Laterality: Left;  DVR Plate  . QUADRICEPS TENDON REPAIR Left 07/15/2017   Procedure: REPAIR QUADRICEP TENDON;  Surgeon: Frederik Pear, MD;  Location: [place];  Service: Orthopedics;  Laterality: Left;  . right femur surgery  05/2012  . THYROIDECTOMY  02/1986  . TOTAL HIP REVISION  10/05/2012   Procedure: TOTAL HIP REVISION;  Surgeon: [name], MD;  [facility];  Service: Orthopedics;  Laterality: Right;  RIGHT TOTAL HIP REVISION  . TOTAL HIP REVISION Right 09/17/2013   Procedure: REVISION RIGHT TOTAL HIP ARTHROPLASTY ;  Surgeon: [name], MD;  [facility];  Service: Orthopedics;  Laterality: Right;  . TOTAL HIP REVISION Right 10/26/2013   Procedure: REVISION RIGHT TOTAL HIP ARTHROPLASTY;  Surgeon: [name], MD;  [facility];  Service: Orthopedics;  Laterality: Right;  . TOTAL HIP REVISION  03/2016  . TOTAL KNEE REVISION Left 04/11/2017   Procedure: TOTAL KNEE REVISION PATELLA and TIBIA;  Surgeon: Frederik Pear, MD;  Location: [place];  Service: Orthopedics;  Laterality: Left;  . WRIST FRACTURE SURGERY  05/2012    SOCIAL HISTORY: Social History   Socioeconomic History  . Marital status: Married    Spouse name: Not on file  . Number of children: Not on file  . Years of education: Not on file  . Highest education level: Not on file  Occupational History  . Not on file  Social Needs  . Financial resource strain:  Not on file  . Food insecurity:    Worry: Not on file    Inability: Not on file  . Transportation needs:    Medical: Not on file    Non-medical: Not on file  Tobacco Use  . Smoking status: Former Smoker    Last attempt to quit: 06/05/2012  Years since quitting: 6.6  . Smokeless tobacco: Never Used  Substance and Sexual Activity  . Alcohol use: Yes    Comment: occasional-social  . Drug use: No  . Sexual activity: Yes  Lifestyle  . Physical activity:    Days per week: Not on file    Minutes per session: Not on file  . Stress: Not on file  Relationships  . Social connections:    Talks on phone: Not on file    Gets together: Not on file    Attends religious service: Not on file    Active member of club or organization: Not on file    Attends meetings of clubs or organizations: Not on file    Relationship status: Not on file  . Intimate partner violence:    Fear of current or ex partner: Not on file    Emotionally abused: Not on file    Physically abused: Not on file    Forced sexual activity: Not on file  Other Topics Concern  . Not on file  Social History Narrative   [place] 2.5 months, went home Feb 21st slipped and fell on back and developed.  Home PT/OT.  Just started outpatient physical therapy.  Friday night, misstepped.      FAMILY HISTORY: Family History  Problem Relation Age of Onset  . CAD Father 69  . Asthma Father   . Alcohol abuse Father   . Arthritis Mother   . Alcohol abuse Sister     ALLERGIES:  is allergic to short ragweed pollen ext.  MEDICATIONS:  Current Outpatient Medications  Medication Sig Dispense Refill  . acetaminophen (TYLENOL) 500 MG tablet Take 1,000 mg by mouth daily as needed for moderate pain.    [name] albuterol (PROVENTIL) (2.5 MG/3ML) 0.083% nebulizer solution Take 3 mLs (2.5 mg total) by nebulization every 4 (four) hours as needed for wheezing or shortness of breath. 75 mL 12  . alfuzosin (UROXATRAL) 10 MG 24 hr tablet Take 10 mg by  mouth at bedtime.    [name] atorvastatin (LIPITOR) 80 MG tablet Take 1 tablet (80 mg total) by mouth daily. 90 tablet 1  . buPROPion (WELLBUTRIN XL) 300 MG 24 hr tablet TAKE 1 TABLET DAILY WITH  BREAKFAST BY MOUTH. (Patient taking differently: Take 300 mg by mouth daily. ) 90 tablet 3  . Calcium Citrate-Vitamin D (CALCIUM CITRATE + D PO) Take 1 tablet by mouth 2 (two) times daily.     . Cholecalciferol (VITAMIN D-3 PO) Take 2,000 Units by mouth 2 (two) times daily.     . clonazePAM (KLONOPIN) 1 MG tablet Take 1 tablet (1 mg total) by mouth 3 (three) times daily as needed for anxiety. TAKE 1 TABLET BY MOUTH  EVERY 6 HOURS prn 5 tablet 1  . diltiazem (CARDIZEM CD) 240 MG 24 hr capsule TAKE 1 CAPSULE BY MOUTH  DAILY 90 capsule 3  . doxycycline (VIBRA-TABS) 100 MG tablet TAKE 1 TABLET BY MOUTH  DAILY 90 tablet 1  . DULoxetine (CYMBALTA) 60 MG capsule TAKE 1 CAPSULE BY MOUTH  TWICE DAILY 180 capsule 1  . EPINEPHrine (EPIPEN 2-PAK) 0.3 mg/0.3 mL IJ SOAJ injection Inject 0.3 mg into the muscle daily as needed (for anaphylactic reaction).    . ezetimibe (ZETIA) 10 MG tablet Take 1 tablet (10 mg total) by mouth daily. 90 tablet 3  . ferrous sulfate 325 (65 FE) MG tablet Take 325 mg by mouth 2 (two) times daily with a meal.    .  HYDROcodone-acetaminophen (NORCO) 7.5-325 MG tablet Take 1 tablet by mouth 4 (four) times daily as needed for moderate pain. Please fill on 11/02/18 6 tablet 0  . HYDROcodone-acetaminophen (NORCO) 7.5-325 MG tablet Take 1 tablet by mouth 4 (four) times daily as needed for moderate pain. Please fill on 01/04/19 120 tablet 0  . levothyroxine (SYNTHROID, LEVOTHROID) 200 MCG tablet TAKE 1 TABLET DAILY AT 6 AM BY MOUTH. (Patient taking differently: Take 225 mcg by mouth daily before breakfast. ) 90 tablet 3  . levothyroxine (SYNTHROID, LEVOTHROID) 25 MCG tablet TAKE 1 TABLET BY MOUTH  DAILY BEFORE BREAKFAST. (Patient taking differently: Take 225 mcg by mouth daily before breakfast. ) 90 tablet 3   . loratadine (CLARITIN) 10 MG tablet Take 10 mg by mouth daily.     . Magnesium 500 MG CAPS Take 1 capsule (500 mg total) by mouth daily. 30 capsule 1  . metoprolol tartrate (LOPRESSOR) 25 MG tablet Take 1 tablet (25 mg total) by mouth 2 (two) times daily. 180 tablet 1  . mirabegron ER (MYRBETRIQ) 50 MG TB24 tablet Take 50 mg by mouth at bedtime.     . Multiple Vitamin (MULTIVITAMIN WITH MINERALS) TABS tablet Take 1 tablet by mouth daily. Men's One-A-Day 33+    . nitroGLYCERIN (NITROSTAT) 0.4 MG SL tablet Place 0.4 mg under the tongue every 5 (five) minutes as needed for chest pain. x3 doses as needed for chest pain    . omeprazole (PRILOSEC) 40 MG capsule TAKE 1 CAPSULE DAILY BEFORE BREAKFAST BY MOUTH. (Patient taking differently: Take 40 mg by mouth daily. ) 90 capsule 3  . ondansetron (ZOFRAN) 4 MG tablet Take 1 tablet (4 mg total) by mouth every 6 (six) hours as needed for nausea. 20 tablet 0  . Probiotic Product (PROBIOTIC PO) Take 1 capsule by mouth daily with breakfast.    . prochlorperazine (COMPAZINE) 10 MG tablet Take 1 tablet (10 mg total) by mouth every 6 (six) hours as needed (Nausea or vomiting). (Patient not taking: Reported on 11/08/2018) 30 tablet 6  . testosterone enanthate (DELATESTRYL) 200 MG/ML injection INJECT 1ML (200MG ) INTO THE MUSCLE EVERY 14 DAYS. DISCARD VIAL AFTER 28 DAYS. 5 mL 3  . tiZANidine (ZANAFLEX) 4 MG tablet TAKE 1 TABLET BY MOUTH  EVERY 6 HOURS AS NEEDED FOR MUSCLE SPASM(S) (Patient taking differently: Take 4 mg by mouth every 6 (six) hours as needed for muscle spasms. ) 360 tablet 1  . vitamin C (ASCORBIC ACID) 500 MG tablet Take 500 mg by mouth daily.    Alveda Reasons 10 MG TABS tablet TAKE 1 TABLET DAILY BY  MOUTH. 90 tablet 3   No current facility-administered medications for this visit.     REVIEW OF SYSTEMS:    A 10+ POINT REVIEW OF SYSTEMS WAS OBTAINED including neurology, dermatology, psychiatry, cardiac, respiratory, lymph, extremities, GI, GU,  Musculoskeletal, constitutional, breasts, reproductive, HEENT.  All pertinent positives are noted in the HPI.  All others are negative.   PHYSICAL EXAMINATION: ECOG PERFORMANCE STATUS: 2 - Symptomatic, <50% confined to bed  . Vitals:   02/01/19 1105  BP: 116/67  Pulse: 95  Resp: 18  Temp: 98.4 F (36.9 C)  SpO2: 99%   Filed Weights   02/01/19 1105  Weight: 298 lb 11.2 oz (135.5 kg)   .Body mass index is 38.35 kg/m.  GENERAL:alert, in no acute distress and comfortable SKIN: chronic venous stasis changes in BLE, psoriasis rashes on elbows EYES: conjunctiva are pink and non-injected, sclera anicteric OROPHARYNX: MMM,  no exudates, no oropharyngeal erythema or ulceration NECK: supple, no JVD LYMPH:  no palpable lymphadenopathy in the cervical, axillary or inguinal regions LUNGS: clear to auscultation b/l with normal respiratory effort HEART: regular rate & rhythm ABDOMEN:  normoactive bowel sounds , non tender, not distended. No palpable hepatosplenomegaly.  Extremity: no pedal edema PSYCH: alert & oriented x 3 with fluent speech NEURO: no focal motor/sensory deficits   LABORATORY DATA:  I have reviewed the data as listed  . CBC Latest Ref Rng & Units 02/01/2019 11/30/2018 11/09/2018  WBC 4.0 - 10.5 K/uL 4.4 4.9 6.4  Hemoglobin 13.0 - 17.0 g/dL 12.6(L) 13.6 10.9(L)  Hematocrit 39.0 - 52.0 % 39.8 42.7 34.6(L)  Platelets 150 - 400 K/uL 155 151 166    . CMP Latest Ref Rng & Units 02/01/2019 11/30/2018 11/09/2018  Glucose 70 - 99 mg/dL 131(H) 86 102(H)  BUN 8 - 23 mg/dL 22 33(H) 23  Creatinine 0.61 - 1.24 mg/dL 1.07 1.34(H) 1.04  Sodium 135 - 145 mmol/L 140 139 136  Potassium 3.5 - 5.1 mmol/L 4.8 4.5 3.5  Chloride 98 - 111 mmol/L 105 106 102  CO2 22 - 32 mmol/L 26 24 26   Calcium 8.9 - 10.3 mg/dL 8.8(L) 9.0 8.7(L)  Total Protein 6.5 - 8.1 g/dL 6.6 6.7 -  Total Bilirubin 0.3 - 1.2 mg/dL 0.4 0.5 -  Alkaline Phos 38 - 126 U/L 68 83 -  AST 15 - 41 U/L 31 25 -  ALT 0 - 44 U/L  35 34 -   . Lab Results  Component Value Date   LDH 197 (H) 02/01/2019    05/29/18 Left tonsil biopsy:   RADIOGRAPHIC STUDIES: I have personally reviewed the radiological images as listed and agreed with the findings in the report. No results found.  ASSESSMENT & PLAN:  73 y.o. male with  1. Diffuse Large B-Cell Lymphoma,- Stage III Left tonsil -05/29/18 Left tonsil biopsy which revealed Diffuse Large B-Cell Lymphoma, which requires treatment  -06/07/18 LDH at 214, will monitor change through treatment.  -Baseline ECHO 06/14/18: EF 50-55% -Standard regimen of R-CHOP with G-CSF support for 6 cycles starting 06/23/18  06/16/18 PET/CT revealed 1. Deauville 5 hypermetabolic lesions in the palatine tonsils, left internal jugular chain, and involving a right inguinal lymph node. Deauville 4 involvement of a left inguinal lymph node and an enlarged subcutaneous lesion favoring lymph node superficial to the lower right sternocleidomastoid muscle in the neck. 2. Other imaging findings of potential clinical significance: Aortic Atherosclerosis. Coronary atherosclerosis. Emphysema. Bilateral nonobstructive nephrolithiasis. Umbilical hernia contains adipose tissues. Old right rib fractures some of which are nonunited. 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   08/22/18 PET/CT revealed Interval response to therapy. Overall Deauville criteria 3. Interval decrease in size and FDG uptake associated with palatine tonsil lesions. There has been resolution of previous left level-II and bilateral inguinal lymph nodes. 2. Enlarged subcutaneous lesion superficial to the lower right sternocleidomastoid muscle exhibits decreased FDG uptake. Currently Deauville criteria 2. 3. No new or progressive sites of disease identified. 4.  Aortic Atherosclerosis. 5. Multi vessel coronary artery atherosclerotic calcifications. 6. Unchanged vertebra plana deformity. 7. Kidney stones.   S/p 6 cycles of R-CHOP completed on 10/12/18  11/22/18 PET/CT  revealed The tonsillar mass has essentially resolved. The large presumed lymph node superficial to the right sternocleidomastoid is stable in size at 2.3 cm in short axis, with maximum SUV of 3.3 (formerly 2.4), representing Deauville 3 activity, previously Deauville 2. 2. Other imaging findings of potential clinical  significance: Aortic Atherosclerosis and Emphysema. Coronary atherosclerosis. Bilateral nonobstructive nephrolithiasis. Umbilical hernia contains adipose tissue. Chronic high activity posterior to the right hip implant along a region of bony irregularity and deformity probably reflecting chronic inflammation. L1 vertebra plana.  PLAN:  -Discussed pt labwork today, 02/01/19; blood counts and chemistries are stable. -02/01/19 LDH is wnl -The pt shows no clinical or lab progression/return of his DLBCL at this time.  -No indication for further treatment of his lymphoma at this time.  -Will continue to monitor the stable subcutaneous lymph node superficial to right lower sternocleidomastoid muscle, which did not appear to change in character throughout the extent of treatment. Pt will let me know if he senses any changes in this. -Will repeat CT Neck in 6 months after last imaging in february -Continue following up with PT -Discussed that if other less suppressive options are available, recommend these over Enbrel for future psoriasis management- pt is now on Otezla BID -Pt is up to speed with flu and pneumonia vaccines. Recommend continuing this with PCP. No live viruses for 2 yrs -Will see the pt back in 3 months  2.  Patient Active Problem List   Diagnosis Date Noted  . Generalized weakness 11/08/2018  . Well adult exam 10/02/2018  . Counseling regarding advance care planning and goals of care 07/09/2018  . Diffuse large B-cell lymphoma ([place]) 06/23/2018  . Port-A-Cath in place 06/23/2018  . Hypogonadism in male 11/22/2017  . Nephrolithiasis 10/24/2017  . Recurrent dislocation of left  patella 07/15/2017  . Recurrent subluxation of patella, left 07/08/2017  . S/P revision of total knee 04/11/2017  . Failed total knee, left, initial encounter ([place]) 04/09/2017  . Renal calculus, right 02/08/2017  . Protein-calorie malnutrition, severe ([place]) 12/08/2016  . Chronic obstructive pulmonary disease ([place]) 12/08/2016  . Staghorn renal calculus 11/05/2016  . Wound drainage right hip 11/15/2013  . Expected blood loss anemia 09/18/2013  . Hyponatremia 09/18/2013  . S/P right TH revision 09/17/2013  . Nonunion, fracture, Right tibia  07/03/2013  . UTI (urinary tract infection) 07/03/2013  . Pathological fracture due to osteoporosis with delayed healing 04/24/2013  . Osteoporosis with fracture 04/24/2013  . Rash and nonspecific skin eruption 03/08/2013  . Syncope 02/21/2013  . PAF (paroxysmal atrial fibrillation) ([place]) 02/21/2013  . Chronic anticoagulation 02/21/2013  . Fall 02/08/2013  . Physical deconditioning 02/08/2013  . Low back pain radiating to both legs 02/08/2013  . Compression fracture of L1 lumbar vertebra (HCC) 02/08/2013  . Right tibial fracture 02/05/2013  . Obesity, unspecified 02/05/2013  . Osteomyelitis of right wrist ([place]) 11/02/2012  . Anemia 10/06/2012  . HTN (hypertension) 10/03/2012  . Psoriasis 10/03/2012  . Dyslipidemia 10/03/2012  . Vitamin D deficiency 10/03/2012  . GERD (gastroesophageal reflux disease) 10/03/2012  . Hyperglycemia 10/03/2012  . Anxiety 10/03/2012  . Depression 10/03/2012  . CAD (coronary artery disease) 10/03/2012  . DJD (degenerative joint disease) 10/03/2012  . Chronic gingivitis 10/03/2012  . Low testosterone, possible hypogonadism 10/01/2012  . Normocytic anemia 09/30/2012  . Right femoral fracture ([place]) 09/29/2012  . Right forearm fracture 09/29/2012  . Atrial fibrillation ([place]) 09/29/2012  . Hypothyroidism 09/29/2012  . History of recurrent UTIs 09/29/2012   -continue mx of medical co-morbidities with PCP   RTC  with Dr Irene Limbo with labs in 3 months   All of the patients questions were answered with apparent satisfaction. The patient knows to call the clinic with any problems, questions or concerns.  The total time spent in the  appt was 25 minutes and more than 50% was on counseling and direct patient cares.    Sullivan Lone MD MS AAHIVMS [facility] Hematology/Oncology Physician [facility]  (Office):       [phone] (Work cell):  [phone] (Fax):           [phone]  02/01/2019 11:34 AM  I, Baldwin [place], am acting as a scribe for Dr. Sullivan Lone.   .I have reviewed the above documentation for accuracy and completeness, and I agree with the above. Brunetta Genera MD

## 2019-02-01 ENCOUNTER — Ambulatory Visit: Payer: Medicare Other | Attending: Hematology | Admitting: Physical Therapy

## 2019-02-01 ENCOUNTER — Encounter: Payer: Self-pay | Admitting: Physical Therapy

## 2019-02-01 ENCOUNTER — Telehealth: Payer: Self-pay | Admitting: Physical Therapy

## 2019-02-01 ENCOUNTER — Inpatient Hospital Stay: Payer: Medicare Other | Attending: Hematology | Admitting: Hematology

## 2019-02-01 ENCOUNTER — Inpatient Hospital Stay: Payer: Medicare Other

## 2019-02-01 ENCOUNTER — Encounter: Payer: Medicare Other | Admitting: Physical Therapy

## 2019-02-01 ENCOUNTER — Other Ambulatory Visit: Payer: Self-pay

## 2019-02-01 ENCOUNTER — Telehealth: Payer: Self-pay | Admitting: Hematology

## 2019-02-01 VITALS — BP 116/67 | HR 95 | Temp 98.4°F | Resp 18 | Ht 74.0 in | Wt 298.7 lb

## 2019-02-01 DIAGNOSIS — Z9181 History of falling: Secondary | ICD-10-CM | POA: Insufficient documentation

## 2019-02-01 DIAGNOSIS — R2689 Other abnormalities of gait and mobility: Secondary | ICD-10-CM | POA: Diagnosis present

## 2019-02-01 DIAGNOSIS — C8339 Diffuse large B-cell lymphoma, extranodal and solid organ sites: Secondary | ICD-10-CM | POA: Diagnosis present

## 2019-02-01 DIAGNOSIS — C8330 Diffuse large B-cell lymphoma, unspecified site: Secondary | ICD-10-CM

## 2019-02-01 DIAGNOSIS — M6281 Muscle weakness (generalized): Secondary | ICD-10-CM | POA: Diagnosis present

## 2019-02-01 LAB — CBC WITH DIFFERENTIAL/PLATELET
Abs Immature Granulocytes: 0.03 10*3/uL (ref 0.00–0.07)
Basophils Absolute: 0 10*3/uL (ref 0.0–0.1)
Basophils Relative: 1 %
Eosinophils Absolute: 0.2 10*3/uL (ref 0.0–0.5)
Eosinophils Relative: 4 %
HCT: 39.8 % (ref 39.0–52.0)
Hemoglobin: 12.6 g/dL — ABNORMAL LOW (ref 13.0–17.0)
Immature Granulocytes: 1 %
Lymphocytes Relative: 29 %
Lymphs Abs: 1.3 10*3/uL (ref 0.7–4.0)
MCH: 30 pg (ref 26.0–34.0)
MCHC: 31.7 g/dL (ref 30.0–36.0)
MCV: 94.8 fL (ref 80.0–100.0)
Monocytes Absolute: 0.4 10*3/uL (ref 0.1–1.0)
Monocytes Relative: 9 %
Neutro Abs: 2.5 10*3/uL (ref 1.7–7.7)
Neutrophils Relative %: 56 %
Platelets: 155 10*3/uL (ref 150–400)
RBC: 4.2 MIL/uL — ABNORMAL LOW (ref 4.22–5.81)
RDW: 13.5 % (ref 11.5–15.5)
WBC: 4.4 10*3/uL (ref 4.0–10.5)
nRBC: 0 % (ref 0.0–0.2)

## 2019-02-01 LAB — CMP (CANCER CENTER ONLY)
ALT: 35 U/L (ref 0–44)
AST: 31 U/L (ref 15–41)
Albumin: 3.2 g/dL — ABNORMAL LOW (ref 3.5–5.0)
Alkaline Phosphatase: 68 U/L (ref 38–126)
Anion gap: 9 (ref 5–15)
BUN: 22 mg/dL (ref 8–23)
CO2: 26 mmol/L (ref 22–32)
Calcium: 8.8 mg/dL — ABNORMAL LOW (ref 8.9–10.3)
Chloride: 105 mmol/L (ref 98–111)
Creatinine: 1.07 mg/dL (ref 0.61–1.24)
GFR, Est AFR Am: >60 mL/min (ref >60)
GFR, Estimated: >60 mL/min (ref >60)
Glucose, Bld: 131 mg/dL — ABNORMAL HIGH (ref 70–99)
Potassium: 4.8 mmol/L (ref 3.5–5.1)
Sodium: 140 mmol/L (ref 135–145)
Total Bilirubin: 0.4 mg/dL (ref 0.3–1.2)
Total Protein: 6.6 g/dL (ref 6.5–8.1)

## 2019-02-01 LAB — LACTATE DEHYDROGENASE: LDH: 197 U/L — ABNORMAL HIGH (ref 98–192)

## 2019-02-01 NOTE — Telephone Encounter (Signed)
Scheduled appt per 4/23 los. °

## 2019-02-01 NOTE — Patient Instructions (Addendum)
Access Code: [email or web address]  URL: [email or web address]  Date: 02/01/2019  Prepared by: Maudry Diego   Exercises  Tandem Stance in Corner - 10 reps - 1 sets - 30 hold - 2x daily - 7x weekly  Single Leg Stance with Support - 10 reps - 1 sets - 10 hold - 2x daily - 7x weekly  Standing Marching - 10 reps - 1 sets - 3 hold - 2x daily - 7x weekly  Romberg Stance - 10 reps - 1 sets - 60 hold - 2x daily - 7x weekly  Side Stepping with Unilateral Counter Support - 10 reps - 3 sets - 1x daily - 7x weekly  Lower Quarter Posteriolateral Reach - 10 reps - 3 sets - 1x daily - 7x weekly  Staggered Stance Weight Shift with Arms Reaching - 10 reps - 3 sets - 1x daily - 7x weekly

## 2019-02-01 NOTE — Telephone Encounter (Signed)
Talked to patient and he agreed to a telehealth for 02/01/2019 @2 :30 pm

## 2019-02-01 NOTE — Therapy (Signed)
[place], [place], [address] Phone: [phone]   Fax:  [phone]  Physical Therapy Treatment  Patient Details  Name: [name] MRN: [id number] Date of Birth: [birth year] Referring Provider (PT): Dr. Irene Limbo  Therapy Telehealth Visit:  I connected with [name]  today at 2:28 pm by [facility] video conference and verified that I am speaking with the correct person using two identifiers.  I discussed the limitations, risks, security and privacy concerns of performing an evaluation and management service by Webex and the availability of in person appointments.  I also discussed with the patient that there may be a patient responsible charge related to this service. The patient expressed understanding and agreed to proceed.    The patient's address was confirmed.  Identified to the patient that therapist is a licensed PT  in the state of .  Verified phone #  to call in case of technical difficulties.   Encounter Date: 02/01/2019  PT End of Session - 02/01/19 1600    Visit Number  25    Number of Visits  48    Date for PT Re-Evaluation  04/05/19    PT Start Time  2025    PT Stop Time  1518    PT Time Calculation (min)  50 min    Activity Tolerance  Patient tolerated treatment well    Behavior During Therapy  [facility] for tasks assessed/performed       Past Medical History:  Diagnosis Date  . A-fib (HCC)    hx.- sinus rhythm now  . Acid reflux   . AKI (acute kidney injury) (HCC)    mild  . Anxiety   . Arthritis   . Atrial dilatation, bilateral    Severly, enlargement  per ECHO 11/03/16  . BPH (benign prostatic hyperplasia)   . CAD (coronary artery disease)   . Cancer ([place])   . Depression   . Dislocation of internal right hip prosthesis, sequela   . Dysrhythmia    hx a fib  . Elevated brain natriuretic peptide (BNP) level   . Environmental allergies   . Fracture of wrist 10/23/13   LEFT   . High cholesterol   . History of Graves' disease   . History of kidney stones    surg. removal 02/2017  . History of syncope   . HLD (hyperlipidemia)   . Hypertension   . Hypothyroidism    thyroid removed  . Left knee injury    cap dislocated  . Low testosterone, possible hypogonadism 10/01/2012  . Lumbago   . Metabolic bone disease   . Multiple falls   . Osteoporosis    Severe  . Other abnormalities of gait and mobility   . Plaque psoriasis   . Sleep apnea    cpap machine-settings 17  . Thrombocytopenia ([place])   . Thyroid disease    HX GRAVES DISEASE  . Transfusion history    s/p 12'13 hip surgery  . Unsteady gait   . UTI (urinary tract infection)   . Vertebral compression fracture (HCC)    L1-wears brace    Past Surgical History:  Procedure Laterality Date  . APPENDECTOMY    . CARDIAC CATHETERIZATION  2013  . CATARACT EXTRACTION, BILATERAL    . COLONOSCOPY    . CYSTOSCOPY WITH RETROGRADE PYELOGRAM, URETEROSCOPY AND STENT PLACEMENT Left 11/07/2017   Procedure: CYSTOSCOPY WITH RETROGRADE PYELOGRAM, URETEROSCOPY AND STENT PLACEMENT;  Surgeon: Cleon Gustin, MD;  Location: [facility];  Service: Urology;  Laterality: Left;  . CYSTOSCOPY/URETEROSCOPY/HOLMIUM LASER/STENT PLACEMENT Right 03/11/2017   Procedure: CYSTOSCOPY/URETEROSCOPYSTENT PLACEMENT right ureter retrograde pylegram;  Surgeon: Cleon Gustin, MD;  [facility]: [facility];  Service: Urology;  Laterality: Right;  . HARDWARE REMOVAL  10/05/2012   Procedure: HARDWARE REMOVAL;  Surgeon: [name], MD;  [facility];  Service: Orthopedics;  Laterality: Right;  REMOVING  STRYKER  GAMMA NAIL  . HARDWARE REMOVAL Right 07/03/2013   Procedure: HARDWARE REMOVAL RIGHT TIBIA ;  Surgeon: Rozanna Box, MD;  Location: [facility];  Service: Orthopedics;  Laterality: Right;  . HIP CLOSED REDUCTION Right 10/15/2013   Procedure: CLOSED MANIPULATION HIP;  Surgeon: [name], MD;  [facility];  Service: Orthopedics;   Laterality: Right;  . hip revision     June 2017  . HOLMIUM LASER APPLICATION Right 7/0/6237   Procedure: HOLMIUM LASER APPLICATION;  Surgeon: Cleon Gustin, MD;  [facility];  Service: Urology;  Laterality: Right;  . INCISION AND DRAINAGE HIP Right 11/16/2013   Procedure: IRRIGATION AND DEBRIDEMENT RIGHT HIP;  Surgeon: [name], MD;  [facility];  Service: Orthopedics;  Laterality: Right;  . IR IMAGING GUIDED PORT INSERTION  06/22/2018  . IR URETERAL STENT LEFT NEW ACCESS W/O SEP NEPHROSTOMY CATH  10/24/2017  . JOINT REPLACEMENT     hip-right x2  . KNEE SURGERY Bilateral 2012   Total knee replacements  . NEPHROLITHOTOMY Right 02/08/2017   Procedure: NEPHROLITHOTOMY PERCUTANEOUS WITH SURGEON ACCESS;  Surgeon: Cleon Gustin, MD;  [facility];  Service: Urology;  Laterality: Right;  . NEPHROLITHOTOMY Left 10/24/2017   Procedure: NEPHROLITHOTOMY PERCUTANEOUS;  Surgeon: Cleon Gustin, MD;  [facility];  Service: Urology;  Laterality: Left;  . ORIF TIBIA FRACTURE Right 02/06/2013   Procedure: OPEN REDUCTION INTERNAL FIXATION (ORIF) TIBIA FRACTURE WITH IM ROD FIBULA;  Surgeon: Rozanna Box, MD;  Location: [place];  Service: Orthopedics;  Laterality: Right;  . ORIF TIBIA FRACTURE Right 07/03/2013   Procedure: RIGHT TIBIA NON UNION REPAIR ;  Surgeon: Rozanna Box, MD;  Location: [facility];  Service: Orthopedics;  Laterality: Right;  . ORIF WRIST FRACTURE  10/02/2012   Procedure: OPEN REDUCTION INTERNAL FIXATION (ORIF) WRIST FRACTURE;  Surgeon: Roseanne Kaufman, MD;  [facility];  Service: Orthopedics;  Laterality: Right;  WITH   ANTIBIOTIC  CEMENT  . ORIF WRIST FRACTURE Left 10/28/2013   Procedure: OPEN REDUCTION INTERNAL FIXATION (ORIF) WRIST FRACTURE with allograft;  Surgeon: Roseanne Kaufman, MD;  [facility];  Service: Orthopedics;  Laterality: Left;  DVR Plate  . QUADRICEPS TENDON REPAIR Left 07/15/2017   Procedure: REPAIR QUADRICEP TENDON;  Surgeon: Frederik Pear, MD;  Location: [facility];  Service: Orthopedics;  Laterality: Left;  . right femur surgery  05/2012  . THYROIDECTOMY  02/1986  . TOTAL HIP REVISION  10/05/2012   Procedure: TOTAL HIP REVISION;  Surgeon: [name], MD;  [facility];  Service: Orthopedics;  Laterality: Right;  RIGHT TOTAL HIP REVISION  . TOTAL HIP REVISION Right 09/17/2013   Procedure: REVISION RIGHT TOTAL HIP ARTHROPLASTY ;  Surgeon: [name], MD;  [facility];  Service: Orthopedics;  Laterality: Right;  . TOTAL HIP REVISION Right 10/26/2013   Procedure: REVISION RIGHT TOTAL HIP ARTHROPLASTY;  Surgeon: [name], MD;  [facility];  Service: Orthopedics;  Laterality: Right;  . TOTAL HIP REVISION  03/2016  . TOTAL KNEE REVISION Left 04/11/2017  Procedure: TOTAL KNEE REVISION PATELLA and TIBIA;  Surgeon: Frederik Pear, MD;  Location: [place];  Service: Orthopedics;  Laterality: Left;  . WRIST FRACTURE SURGERY  05/2012    There were no vitals filed for this visit.  Subjective Assessment - 02/01/19 1548    Subjective  Pt states that he had a visit with Dr. Irene Limbo this morning and did alot of walking to get into the cancer center. He states he has been walking in the neighborhood 5-8 minutes with no rest breaks several times a week. He reports he is doing the exercises that he learned in PT often throughout the day.  He reports his knees still feel like they might buckle several times a day and is happens with both legs.  But, he is able to identify it and control it earlier so he has not have the episodes of falls that he had been having.     Patient is accompained by:  Family member    Pertinent History  2013 fall resulting in a femur break on the Rt leg resulting in multiple surgeries due to failures including hip replacements another broken tibia leading to the discovery of osteoporosis and a metabolic bone disease.  After forteo injections x 2 years he is now in the osteopenia category.  Has a new scan  coming up in a few weeks.  prior to the cancer diagnosis he was able to be confident with a cane.  Then was diagnosed with diffuse non-hogkin large cell B-lymphoma 07/09/18 located in tonsils and Rt groin. Treatment consists of an infusion every 3 weeks hopefully for a total of 5, A-fib controlled, HTN controlled with medication    Patient Stated Goals  to get balance back and continue with strengthening.     Currently in Pain?  Yes    Pain Score  --   did not rate    Pain Location  Leg    Pain Orientation  Right;Left    Pain Descriptors / Indicators  Aching    Pain Type  Chronic pain         [facility] PT Assessment - 02/01/19 0001      Assessment   Medical Diagnosis  diffuse large B cell lymphoma    Referring Provider (PT)  Dr. Irene Limbo    Onset Date/Surgical Date  07/06/18      Prior Function   Level of Independence  Independent      Observation/Other Assessments   Observations  observed pt stand from chair and walk in home with walker with partner holding phone, for video.  Pt appeared to be very stable and was offerred a cue to keep chest up to decrease weight bearing through arms with more weight through legs.                    [facility] Adult PT Treatment/Exercise - 02/01/19 0001      Self-Care   Self-Care  Other Self-Care Comments    Other Self-Care Comments   discussed shoe lift.  Pt plans to order Arsenio Loader Adjustable heel lift to see if that will help him improve his gait technique, and , if it does, he plans to get a permanent shoe lift applied to his athletic shoe at [employer]   Other Exercises   upgraded home exercise program on Medbridge with pt and PT viewing each exercise and adding modifications.  See Patient Instructions section for each exercise.  PT Education - 02/01/19 1558    Education Details  upgraded home exercise for more progressive balance activities medbridge Access Code: [email or web address]    Person(s) Educated   Patient    Methods  Explanation;Handout   emailed medbridge link   Comprehension  Verbalized understanding       PT Short Term Goals - 02/01/19 1611      PT SHORT TERM GOAL #1   Title  Episodes of "buckling" will decrease to one time a day.     Time  8    Period  Weeks    Status  On-going      PT SHORT TERM GOAL #2   Title  Pt will report he is doing strengthening exercise daily.     Status  Achieved      PT SHORT TERM GOAL #3   Title  Pt will be able to complete or attempt to complete all tasks fo teh BERG balace assessment indicating a decrease in movment anxiety     Time  8    Period  Weeks    Status  On-going        PT Long Term Goals - 02/01/19 1611      PT LONG TERM GOAL #1   Title  Pt will improve sit to stand x 30sec from 21" surface to 8 times     Baseline  09/28/2018  7 times in 30 sec. , 5 times on 12/04/2018     Time  8    Period  Weeks    Status  On-going      PT LONG TERM GOAL #2   Title  Pt will improve Berg Balance Assessment  to at least 35    Baseline  29 on re-eval     Time  8    Period  Weeks    Status  On-going      PT LONG TERM GOAL #3   Title  Pt will report he is able to go up and down the steps to enter his home without supervision     Status  Achieved      PT LONG TERM GOAL #4   Title  pt will be able to walk for 6 minutes without a rest break     Baseline  on 09/28/2018  18.5 sec , 12/03/2018  Pt reports he is walking 5-8 minutes at home in his neighborhood, some which is uphill     Status  Achieved   per pt report      PT LONG TERM GOAL #5   Title  Pt will report no instances of Rt LE giving out x 5 days at home     Baseline  still has difficulty with both legs buckling, but he feels he can control it better     Time  8    Status  On-going      PT LONG TERM GOAL #6   Title  Pt be able to perform one SLR on left leg to 8 inches with no terminal extension lag     Time  8    Period  Weeks    Status  On-going             Plan - 02/01/19 1600    Clinical Impression Statement  Pt has made steady improvement since he was last seen in our clinic before closings due to the coronavirus. He reports he is doing more walking and exercising at home. He continues to  use his walker and has knee buckling, but is now able to control it better. He is able to enter his home without supervision. He feels his mobility confidence is better.  He has made progress toward his goals and is ready to progress to more advanced balance and strengthening. He plans to get a shoe lift to even out his leg length discrepency and that should improve his gait.  Pt wants to continue with weekly telehealth visits until the clinic is reopened.  Agree with this approach in light of his recent chemotherapy.  Will send recert to Dr. Irene Limbo today.    Clinical Impairments Affecting Rehab Potential  history of Rt hip fractures and functional impairment     PT Next Visit Plan  focus on UE strengthening with upgraded Medibridge program     PT Home Exercise Plan  [email or web address], Access Code: [email or web address] on 4/23/20202    Consulted and Agree with Plan of Care  Patient       Patient will benefit from skilled therapeutic intervention in order to improve the following deficits and impairments:  Abnormal gait, Decreased activity tolerance, Decreased endurance, Decreased strength, Decreased balance, Difficulty walking, Cardiopulmonary status limiting activity, Impaired perceived functional ability, Obesity, Postural dysfunction  Visit Diagnosis: Other abnormalities of gait and mobility - Plan: PT plan of care cert/re-cert  History of falling - Plan: PT plan of care cert/re-cert  Muscle weakness (generalized) - Plan: PT plan of care cert/re-cert     Problem List Patient Active Problem List   Diagnosis Date Noted  . Generalized weakness 11/08/2018  . Well adult exam 10/02/2018  . Counseling regarding advance care planning and goals of care 07/09/2018  .  Diffuse large B-cell lymphoma ([place]) 06/23/2018  . Port-A-Cath in place 06/23/2018  . Hypogonadism in male 11/22/2017  . Nephrolithiasis 10/24/2017  . Recurrent dislocation of left patella 07/15/2017  . Recurrent subluxation of patella, left 07/08/2017  . S/P revision of total knee 04/11/2017  . Failed total knee, left, initial encounter ([place]) 04/09/2017  . Renal calculus, right 02/08/2017  . Protein-calorie malnutrition, severe ([place]) 12/08/2016  . Chronic obstructive pulmonary disease ([place]) 12/08/2016  . [place] renal calculus 11/05/2016  . Wound drainage right hip 11/15/2013  . Expected blood loss anemia 09/18/2013  . Hyponatremia 09/18/2013  . S/P right TH revision 09/17/2013  . Nonunion, fracture, Right tibia  07/03/2013  . UTI (urinary tract infection) 07/03/2013  . Pathological fracture due to osteoporosis with delayed healing 04/24/2013  . Osteoporosis with fracture 04/24/2013  . Rash and nonspecific skin eruption 03/08/2013  . Syncope 02/21/2013  . PAF (paroxysmal atrial fibrillation) ([place]) 02/21/2013  . Chronic anticoagulation 02/21/2013  . Fall 02/08/2013  . Physical deconditioning 02/08/2013  . Low back pain radiating to both legs 02/08/2013  . Compression fracture of L1 lumbar vertebra (HCC) 02/08/2013  . Right tibial fracture 02/05/2013  . Obesity, unspecified 02/05/2013  . Osteomyelitis of right wrist ([place]) 11/02/2012  . Anemia 10/06/2012  . HTN (hypertension) 10/03/2012  . Psoriasis 10/03/2012  . Dyslipidemia 10/03/2012  . Vitamin D deficiency 10/03/2012  . GERD (gastroesophageal reflux disease) 10/03/2012  . Hyperglycemia 10/03/2012  . Anxiety 10/03/2012  . Depression 10/03/2012  . CAD (coronary artery disease) 10/03/2012  . DJD (degenerative joint disease) 10/03/2012  . Chronic gingivitis 10/03/2012  . Low testosterone, possible hypogonadism 10/01/2012  . Normocytic anemia 09/30/2012  . Right femoral fracture ([place]) 09/29/2012  . Right forearm fracture  09/29/2012  . Atrial fibrillation ([place]) 09/29/2012  . Hypothyroidism 09/29/2012  .  History of recurrent UTIs 09/29/2012   Donato Heinz. Owens Shark PT  [place] [facility] 02/01/2019, [place], [place], [address] Phone: [phone]   Fax:  [phone]  Name: [name] MRN: [id number] Date of Birth: January 14, 1946

## 2019-02-05 ENCOUNTER — Other Ambulatory Visit: Payer: Self-pay | Admitting: Internal Medicine

## 2019-02-05 MED ORDER — HYDROCODONE-ACETAMINOPHEN 7.5-325 MG PO TABS: 1.0000 | ORAL_TABLET | Freq: Four times a day (QID) | ORAL | 0 refills | Status: DC | PRN

## 2019-02-06 ENCOUNTER — Encounter: Payer: Medicare Other | Admitting: Physical Therapy

## 2019-02-08 ENCOUNTER — Other Ambulatory Visit: Payer: Self-pay

## 2019-02-08 ENCOUNTER — Ambulatory Visit: Payer: Medicare Other | Admitting: Physical Therapy

## 2019-02-08 ENCOUNTER — Encounter: Payer: Self-pay | Admitting: Physical Therapy

## 2019-02-08 ENCOUNTER — Encounter: Payer: Medicare Other | Admitting: Physical Therapy

## 2019-02-08 DIAGNOSIS — R2689 Other abnormalities of gait and mobility: Secondary | ICD-10-CM

## 2019-02-08 DIAGNOSIS — M6281 Muscle weakness (generalized): Secondary | ICD-10-CM

## 2019-02-08 DIAGNOSIS — Z9181 History of falling: Secondary | ICD-10-CM

## 2019-02-08 NOTE — Therapy (Signed)
[place], [place], [address] Phone: [phone]   Fax:  [phone]  Physical Therapy Treatment  Patient Details  Name: [name] MRN: [id number] Date of Birth: 14-Feb-1946 Referring Provider (PT): Dr. Irene Limbo   Encounter Date: 02/08/2019   Therapy Telehealth Visit:  I connected with [name]  (patient name) today at 1:22 pm  (time) by [facility] video conference and verified that I am speaking with the correct person using two identifiers.  I discussed the limitations, risks, security and privacy concerns of performing an evaluation and management service by Webex and the availability of in person appointments.  I also discussed with the patient that there may be a patient responsible charge related to this service. The patient expressed understanding and agreed to proceed.    The patient's address was confirmed.  Identified to the patient that therapist is a licensed PT  in the state of [place].  Verified phone # as [phone] to call in case of technical difficulties.    Past Medical History:  Diagnosis Date  . A-fib (HCC)    hx.- sinus rhythm now  . Acid reflux   . AKI (acute kidney injury) (HCC)    mild  . Anxiety   . Arthritis   . Atrial dilatation, bilateral    Severly, enlargement  per ECHO 11/03/16  . BPH (benign prostatic hyperplasia)   . CAD (coronary artery disease)   . Cancer ([facility])   . Depression   . Dislocation of internal right hip prosthesis, sequela   . Dysrhythmia    hx a fib  . Elevated brain natriuretic peptide (BNP) level   . Environmental allergies   . Fracture of wrist 10/23/13   LEFT  . High cholesterol   . History of Graves' disease   . History of kidney stones    surg. removal 02/2017  . History of syncope   . HLD (hyperlipidemia)   . Hypertension   . Hypothyroidism    thyroid removed  . Left knee injury    cap dislocated  . Low testosterone, possible  hypogonadism 10/01/2012  . Lumbago   . Metabolic bone disease   . Multiple falls   . Osteoporosis    Severe  . Other abnormalities of gait and mobility   . Plaque psoriasis   . Sleep apnea    cpap machine-settings 17  . Thrombocytopenia ([facility])   . Thyroid disease    HX GRAVES DISEASE  . Transfusion history    s/p 12'13 hip surgery  . Unsteady gait   . UTI (urinary tract infection)   . Vertebral compression fracture (HCC)    L1-wears brace    Past Surgical History:  Procedure Laterality Date  . APPENDECTOMY    . CARDIAC CATHETERIZATION  2013  . CATARACT EXTRACTION, BILATERAL    . COLONOSCOPY    . CYSTOSCOPY WITH RETROGRADE PYELOGRAM, URETEROSCOPY AND STENT PLACEMENT Left 11/07/2017   Procedure: CYSTOSCOPY WITH RETROGRADE PYELOGRAM, URETEROSCOPY AND STENT PLACEMENT;  Surgeon: Cleon Gustin, MD;  Location: [facility];  Service: Urology;  Laterality: Left;  . CYSTOSCOPY/URETEROSCOPY/HOLMIUM LASER/STENT PLACEMENT Right 03/11/2017   Procedure: CYSTOSCOPY/URETEROSCOPYSTENT PLACEMENT right ureter retrograde pylegram;  Surgeon: Cleon Gustin, MD;  Location: [facility];  Service: Urology;  Laterality: Right;  . HARDWARE REMOVAL  10/05/2012   Procedure: HARDWARE REMOVAL;  Surgeon: [name], MD;  Location: [facility];  Service: Orthopedics;  Laterality: Right;  REMOVING  STRYKER  GAMMA NAIL  .  HARDWARE REMOVAL Right 07/03/2013   Procedure: HARDWARE REMOVAL RIGHT TIBIA ;  Surgeon: Rozanna Box, MD;  Location: [facility];  Service: Orthopedics;  Laterality: Right;  . HIP CLOSED REDUCTION Right 10/15/2013   Procedure: CLOSED MANIPULATION HIP;  Surgeon: [name], MD;  Location: [facility];  Service: Orthopedics;  Laterality: Right;  . hip revision     June 2017  . HOLMIUM LASER APPLICATION Right 05/12/5052   Procedure: HOLMIUM LASER APPLICATION;  Surgeon: Cleon Gustin, MD;  [facility];  Service: Urology;  Laterality: Right;  . INCISION AND DRAINAGE HIP Right 11/16/2013   Procedure:  IRRIGATION AND DEBRIDEMENT RIGHT HIP;  Surgeon: [name], MD;  Location: [facility];  Service: Orthopedics;  Laterality: Right;  . IR IMAGING GUIDED PORT INSERTION  06/22/2018  . IR URETERAL STENT LEFT NEW ACCESS W/O SEP NEPHROSTOMY CATH  10/24/2017  . JOINT REPLACEMENT     hip-right x2  . KNEE SURGERY Bilateral 2012   Total knee replacements  . NEPHROLITHOTOMY Right 02/08/2017   Procedure: NEPHROLITHOTOMY PERCUTANEOUS WITH SURGEON ACCESS;  Surgeon: Cleon Gustin, MD;  [facility];  Service: Urology;  Laterality: Right;  . NEPHROLITHOTOMY Left 10/24/2017   Procedure: NEPHROLITHOTOMY PERCUTANEOUS;  Surgeon: Cleon Gustin, MD;  [facility];  Service: Urology;  Laterality: Left;  . ORIF TIBIA FRACTURE Right 02/06/2013   Procedure: OPEN REDUCTION INTERNAL FIXATION (ORIF) TIBIA FRACTURE WITH IM ROD FIBULA;  Surgeon: Rozanna Box, MD;  Location: [facility];  Service: Orthopedics;  Laterality: Right;  . ORIF TIBIA FRACTURE Right 07/03/2013   Procedure: RIGHT TIBIA NON UNION REPAIR ;  Surgeon: Rozanna Box, MD;  Location: [facility];  Service: Orthopedics;  Laterality: Right;  . ORIF WRIST FRACTURE  10/02/2012   Procedure: OPEN REDUCTION INTERNAL FIXATION (ORIF) WRIST FRACTURE;  Surgeon: Roseanne Kaufman, MD;  [facility];  Service: Orthopedics;  Laterality: Right;  WITH   ANTIBIOTIC  CEMENT  . ORIF WRIST FRACTURE Left 10/28/2013   Procedure: OPEN REDUCTION INTERNAL FIXATION (ORIF) WRIST FRACTURE with allograft;  Surgeon: Roseanne Kaufman, MD;  [facility];  Service: Orthopedics;  Laterality: Left;  DVR Plate  . QUADRICEPS TENDON REPAIR Left 07/15/2017   Procedure: REPAIR QUADRICEP TENDON;  Surgeon: Frederik Pear, MD;  Location: [facility];  Service: Orthopedics;  Laterality: Left;  . right femur surgery  05/2012  . THYROIDECTOMY  02/1986  . TOTAL HIP REVISION  10/05/2012   Procedure: TOTAL HIP REVISION;  Surgeon: [name], MD;  [facility];  Service: Orthopedics;  Laterality:  Right;  RIGHT TOTAL HIP REVISION  . TOTAL HIP REVISION Right 09/17/2013   Procedure: REVISION RIGHT TOTAL HIP ARTHROPLASTY ;  Surgeon: [name], MD;  [facility];  Service: Orthopedics;  Laterality: Right;  . TOTAL HIP REVISION Right 10/26/2013   Procedure: REVISION RIGHT TOTAL HIP ARTHROPLASTY;  Surgeon: [name], MD;  [facility];  Service: Orthopedics;  Laterality: Right;  . TOTAL HIP REVISION  03/2016  . TOTAL KNEE REVISION Left 04/11/2017   Procedure: TOTAL KNEE REVISION PATELLA and TIBIA;  Surgeon: Frederik Pear, MD;  Location: [facility];  Service: Orthopedics;  Laterality: Left;  . WRIST FRACTURE SURGERY  05/2012    There were no vitals filed for this visit.  Subjective Assessment - 02/08/19 1446    Subjective  "I had a tumble yesterday"  Pt states his foot slipped on the carpet getting trying to get to his stairlift and  he went down on his left knee.  He was able to stand up pulling up with his arms.  He states he is continuing to walk outside when weather permits.  He has been using his [place], but wasn't able to get into it today.     Pertinent History  2013 fall resulting in a femur break on the Rt leg resulting in multiple surgeries due to failures including hip replacements another broken tibia leading to the discovery of osteoporosis and a metabolic bone disease.  After forteo injections x 2 years he is now in the osteopenia category.  Has a new scan coming up in a few weeks.  prior to the cancer diagnosis he was able to be confident with a cane.  Then was diagnosed with diffuse non-hogkin large cell B-lymphoma 07/09/18 located in tonsils and Rt groin. Treatment consists of an infusion every 3 weeks hopefully for a total of 5, A-fib controlled, HTN controlled with medication    Patient Stated Goals  to get balance back and continue with strengthening.     Currently in Pain?  No/denies                       [facility] Adult PT Treatment/Exercise -  02/08/19 0001      High Level Balance   High Level Balance Comments  standing with walker nearby, but hands not on walker for 4 mintues total while moving head in all directions, arms up across chest and down, small gradual weight shifts from great toe to heel and back.       Exercises   Other Exercises   upgraded home exercise program on [employer] to include the UE exercise done today and resent the whole program       Lumbar Exercises: Seated   Other Seated Lumbar Exercises   seated roll over to stretch low back then roll up to restack the spine, reach overhead for thoracic extension and shoulder ROM       Knee/Hip Exercises: Seated   Other Seated Knee/Hip Exercises  green theraband on foot for resisted knee to chest with press outs/ also resisted ankle dorsi and plantarflexion     Marching  Strengthening;Right;Left;10 reps    Abduction/Adduction   Strengthening;Right;Left;10 reps   with green theraband, unilalteral each leg,bilateral x 10   Sit to Sand  10 reps;with UE support   stong push up from armrests of chair      Shoulder Exercises: Seated   Extension  Strengthening;Right;Left;10 reps;Theraband    Theraband Level (Shoulder Extension)  Level 3 (Green)    Horizontal ABduction  Strengthening;Right;Left;10 reps;Theraband    Theraband Level (Shoulder Horizontal ABduction)  Level 3 (Green)    Flexion  Strengthening;Right;Left;10 reps;Theraband    Theraband Level (Shoulder Flexion)  Level 3 (Green)    Diagonals  Strengthening;Right;Left;10 reps;Theraband    Theraband Level (Shoulder Diagonals)  Level 3 (Green)      Ankle Exercises: Seated   Heel Raises  Right;Left;10 reps   cues to keep right great toe down MCPjoint extension   Other Seated Ankle Exercises  toe flexion and extension     Other Seated Ankle Exercises  inversion and eversion                PT Short Term Goals - 02/01/19 1611      PT SHORT TERM GOAL #1   Title  Episodes of "buckling" will decrease to  one time a day.  Time  8    Period  Weeks    Status  On-going      PT SHORT TERM GOAL #2   Title  Pt will report he is doing strengthening exercise daily.     Status  Achieved      PT SHORT TERM GOAL #3   Title  Pt will be able to complete or attempt to complete all tasks fo teh BERG balace assessment indicating a decrease in movment anxiety     Time  8    Period  Weeks    Status  On-going        PT Long Term Goals - 02/01/19 1611      PT LONG TERM GOAL #1   Title  Pt will improve sit to stand x 30sec from 21" surface to 8 times     Baseline  09/28/2018  7 times in 30 sec. , 5 times on 12/04/2018     Time  8    Period  Weeks    Status  On-going      PT LONG TERM GOAL #2   Title  Pt will improve Berg Balance Assessment  to at least 35    Baseline  29 on re-eval     Time  8    Period  Weeks    Status  On-going      PT LONG TERM GOAL #3   Title  Pt will report he is able to go up and down the steps to enter his home without supervision     Status  Achieved      PT LONG TERM GOAL #4   Title  pt will be able to walk for 6 minutes without a rest break     Baseline  on 09/28/2018  18.5 sec , 12/03/2018  Pt reports he is walking 5-8 minutes at home in his neighborhood, some which is up[place]     Status  Achieved   per pt report      PT LONG TERM GOAL #5   Title  Pt will report no instances of Rt LE giving out x 5 days at home     Baseline  still has difficulty with both legs buckling, but he feels he can control it better     Time  8    Status  On-going      PT LONG TERM GOAL #6   Title  Pt be able to perform one SLR on left leg to 8 inches with no terminal extension lag     Time  8    Period  Weeks    Status  On-going            Plan - 02/08/19 1515    Clinical Impression Statement  Pt continues to make improvment.  Today was focused on UE strengthening with frequent cues for  core alignment and activation. Pt did well with good support from [name] today to  hold the camera for telehealth visit and assit with placement of theraband as needed.     Clinical Impairments Affecting Rehab Potential  history of Rt hip fractures and functional impairment     PT Frequency  2x / week    PT Duration  8 weeks    PT Treatment/Interventions  ADLs/Self Care Home Management;Therapeutic exercise;Patient/family education;Gait training;Stair training;Balance training;Neuromuscular re-education    PT Next Visit Plan  check to see if pt has tried out his heel lifts. If so assess gait and continue  with standing work.  Or, do supine, sidelying. prone work on bed, or, practice controlled falling and getting up from the floor.     PT Home Exercise Plan  [email or web address], Access Code: [email or web address] on 4/23/20202; [email or web address] upgraded to add shoulder resistance exercise 02/08/2019    Consulted and Agree with Plan of Care  Patient;Family member/caregiver    Family Member Consulted  [name]       Patient will benefit from skilled therapeutic intervention in order to improve the following deficits and impairments:  Abnormal gait, Decreased activity tolerance, Decreased endurance, Decreased strength, Decreased balance, Difficulty walking, Cardiopulmonary status limiting activity, Impaired perceived functional ability, Obesity, Postural dysfunction  Visit Diagnosis: Other abnormalities of gait and mobility  History of falling  Muscle weakness (generalized)     Problem List Patient Active Problem List   Diagnosis Date Noted  . Generalized weakness 11/08/2018  . Well adult exam 10/02/2018  . Counseling regarding advance care planning and goals of care 07/09/2018  . Diffuse large B-cell lymphoma ([facility]) 06/23/2018  . Port-A-Cath in place 06/23/2018  . Hypogonadism in male 11/22/2017  . Nephrolithiasis 10/24/2017  . Recurrent dislocation of left patella 07/15/2017  . Recurrent subluxation of patella, left 07/08/2017  . S/P revision of total knee 04/11/2017  . Failed total knee, left,  initial encounter ([facility]) 04/09/2017  . Renal calculus, right 02/08/2017  . Protein-calorie malnutrition, severe ([facility]) 12/08/2016  . Chronic obstructive pulmonary disease ([facility]) 12/08/2016  . Staghorn renal calculus 11/05/2016  . Wound drainage right hip 11/15/2013  . Expected blood loss anemia 09/18/2013  . Hyponatremia 09/18/2013  . S/P right TH revision 09/17/2013  . Nonunion, fracture, Right tibia  07/03/2013  . UTI (urinary tract infection) 07/03/2013  . Pathological fracture due to osteoporosis with delayed healing 04/24/2013  . Osteoporosis with fracture 04/24/2013  . Rash and nonspecific skin eruption 03/08/2013  . Syncope 02/21/2013  . PAF (paroxysmal atrial fibrillation) ([facility]) 02/21/2013  . Chronic anticoagulation 02/21/2013  . Fall 02/08/2013  . Physical deconditioning 02/08/2013  . Low back pain radiating to both legs 02/08/2013  . Compression fracture of L1 lumbar vertebra (HCC) 02/08/2013  . Right tibial fracture 02/05/2013  . Obesity, unspecified 02/05/2013  . Osteomyelitis of right wrist ([facility]) 11/02/2012  . Anemia 10/06/2012  . HTN (hypertension) 10/03/2012  . Psoriasis 10/03/2012  . Dyslipidemia 10/03/2012  . Vitamin D deficiency 10/03/2012  . GERD (gastroesophageal reflux disease) 10/03/2012  . Hyperglycemia 10/03/2012  . Anxiety 10/03/2012  . Depression 10/03/2012  . CAD (coronary artery disease) 10/03/2012  . DJD (degenerative joint disease) 10/03/2012  . Chronic gingivitis 10/03/2012  . Low testosterone, possible hypogonadism 10/01/2012  . Normocytic anemia 09/30/2012  . Right femoral fracture ([facility]) 09/29/2012  . Right forearm fracture 09/29/2012  . Atrial fibrillation ([facility]) 09/29/2012  . Hypothyroidism 09/29/2012  . History of recurrent UTIs 09/29/2012   Joseph Heinz. Owens Shark PT  Norwood Levo 02/08/2019, 3:20 PM  [place], [place], [address] Phone: [phone]   Fax:   [phone]  Name: [name] MRN: [id number] Date of Birth: 08-27-1946

## 2019-02-08 NOTE — Patient Instructions (Signed)
Access Code: [email or web address]  URL: [email or web address]  Date: 02/08/2019  Prepared by: Maudry Diego   Exercises  Seated Shoulder Press with Resistance - 10 reps - 1-3 sets - 1x daily - 3-5x weekly  Seated Shoulder Extension with Self-Anchored Resistance - 10 reps - 1-3 sets - 1x daily - 3-5x weekly  Seated Shoulder Horizontal Abduction with Resistance - Palms Down - 10 reps - 1-3 sets - 1x daily - 3-5x weekly

## 2019-02-09 ENCOUNTER — Ambulatory Visit: Payer: Medicare Other | Admitting: Physical Therapy

## 2019-02-15 ENCOUNTER — Encounter: Payer: Self-pay | Admitting: Physical Therapy

## 2019-02-15 ENCOUNTER — Ambulatory Visit: Payer: Medicare Other | Attending: Hematology | Admitting: Physical Therapy

## 2019-02-15 DIAGNOSIS — Z9181 History of falling: Secondary | ICD-10-CM | POA: Diagnosis present

## 2019-02-15 DIAGNOSIS — M6281 Muscle weakness (generalized): Secondary | ICD-10-CM | POA: Diagnosis present

## 2019-02-15 DIAGNOSIS — R2689 Other abnormalities of gait and mobility: Secondary | ICD-10-CM | POA: Insufficient documentation

## 2019-02-15 NOTE — Therapy (Signed)
[place] [place], [place], [address] Phone: [phone]   Fax:  [phone]  Physical Therapy Treatment  Patient Details  Name: [name] MRN: [id number] Date of Birth: October 11, 1946 Referring Provider (PT): Dr. Irene Limbo   Encounter Date: 02/15/2019 Therapy Telehealth Visit:  I connected with [name]  today at 13:15 by Webex video conference and verified that I am speaking with the correct person using two identifiers.  I discussed the limitations, risks, security and privacy concerns of performing an evaluation and management service by Webex and the availability of in person appointments.  I also discussed with the patient that there may be a patient responsible charge related to this service. The patient expressed understanding and agreed to proceed.    The patient's address was confirmed.  Identified to the patient that therapist is a licensed PT  in the state of [place].  Verified phone # as [phone]  to call in case of technical difficulties.   PT End of Session - 02/15/19 1508    Visit Number  26    Number of Visits  48    Date for PT Re-Evaluation  04/05/19    PT Start Time  1320    PT Stop Time  1400    PT Time Calculation (min)  40 min    Activity Tolerance  Patient tolerated treatment well       Past Medical History:  Diagnosis Date  . A-fib (HCC)    hx.- sinus rhythm now  . Acid reflux   . AKI (acute kidney injury) (HCC)    mild  . Anxiety   . Arthritis   . Atrial dilatation, bilateral    Severly, enlargement  per ECHO 11/03/16  . BPH (benign prostatic hyperplasia)   . CAD (coronary artery disease)   . Cancer ([place])   . Depression   . Dislocation of internal right hip prosthesis, sequela   . Dysrhythmia    hx a fib  . Elevated brain natriuretic peptide (BNP) level   . Environmental allergies   . Fracture of wrist 10/23/13   LEFT  . High cholesterol   . History of Graves'  disease   . History of kidney stones    surg. removal 02/2017  . History of syncope   . HLD (hyperlipidemia)   . Hypertension   . Hypothyroidism    thyroid removed  . Left knee injury    cap dislocated  . Low testosterone, possible hypogonadism 10/01/2012  . Lumbago   . Metabolic bone disease   . Multiple falls   . Osteoporosis    Severe  . Other abnormalities of gait and mobility   . Plaque psoriasis   . Sleep apnea    cpap machine-settings 17  . Thrombocytopenia ([place])   . Thyroid disease    HX GRAVES DISEASE  . Transfusion history    s/p 12'13 hip surgery  . Unsteady gait   . UTI (urinary tract infection)   . Vertebral compression fracture (HCC)    L1-wears brace    Past Surgical History:  Procedure Laterality Date  . APPENDECTOMY    . CARDIAC CATHETERIZATION  2013  . CATARACT EXTRACTION, BILATERAL    . COLONOSCOPY    . CYSTOSCOPY WITH RETROGRADE PYELOGRAM, URETEROSCOPY AND STENT PLACEMENT Left 11/07/2017   Procedure: CYSTOSCOPY WITH RETROGRADE PYELOGRAM, URETEROSCOPY AND STENT PLACEMENT;  Surgeon: Cleon Gustin, MD;  Location: [facility];  Service: Urology;  Laterality: Left;  . CYSTOSCOPY/URETEROSCOPY/HOLMIUM  LASER/STENT PLACEMENT Right 03/11/2017   Procedure: CYSTOSCOPY/URETEROSCOPYSTENT PLACEMENT right ureter retrograde pylegram;  Surgeon: Cleon Gustin, MD;  Location: [facility];  Service: Urology;  Laterality: Right;  . HARDWARE REMOVAL  10/05/2012   Procedure: HARDWARE REMOVAL;  Surgeon: [name], MD;  Location: [facility];  Service: Orthopedics;  Laterality: Right;  REMOVING  STRYKER  GAMMA NAIL  . HARDWARE REMOVAL Right 07/03/2013   Procedure: HARDWARE REMOVAL RIGHT TIBIA ;  Surgeon: Rozanna Box, MD;  Location: [facility];  Service: Orthopedics;  Laterality: Right;  . HIP CLOSED REDUCTION Right 10/15/2013   Procedure: CLOSED MANIPULATION HIP;  Surgeon: [name], MD;  Location: [facility];  Service: Orthopedics;  Laterality: Right;  . hip revision     June  2017  . HOLMIUM LASER APPLICATION Right 10/12/8784   Procedure: HOLMIUM LASER APPLICATION;  Surgeon: Cleon Gustin, MD;  Location: [facility];  Service: Urology;  Laterality: Right;  . INCISION AND DRAINAGE HIP Right 11/16/2013   Procedure: IRRIGATION AND DEBRIDEMENT RIGHT HIP;  Surgeon: [name], MD;  Location: [facility];  Service: Orthopedics;  Laterality: Right;  . IR IMAGING GUIDED PORT INSERTION  06/22/2018  . IR URETERAL STENT LEFT NEW ACCESS W/O SEP NEPHROSTOMY CATH  10/24/2017  . JOINT REPLACEMENT     hip-right x2  . KNEE SURGERY Bilateral 2012   Total knee replacements  . NEPHROLITHOTOMY Right 02/08/2017   Procedure: NEPHROLITHOTOMY PERCUTANEOUS WITH SURGEON ACCESS;  Surgeon: Cleon Gustin, MD;  Location[facility];  Service: Urology;  Laterality: Right;  . NEPHROLITHOTOMY Left 10/24/2017   Procedure: NEPHROLITHOTOMY PERCUTANEOUS;  Surgeon: Cleon Gustin, MD;  Location[facility];  Service: Urology;  Laterality: Left;  . ORIF TIBIA FRACTURE Right 02/06/2013   Procedure: OPEN REDUCTION INTERNAL FIXATION (ORIF) TIBIA FRACTURE WITH IM ROD FIBULA;  Surgeon: Rozanna Box, MD;  Location: [facility];  Service: Orthopedics;  Laterality: Right;  . ORIF TIBIA FRACTURE Right 07/03/2013   Procedure: RIGHT TIBIA NON UNION REPAIR ;  Surgeon: Rozanna Box, MD;  Location: [facility];  Service: Orthopedics;  Laterality: Right;  . ORIF WRIST FRACTURE  10/02/2012   Procedure: OPEN REDUCTION INTERNAL FIXATION (ORIF) WRIST FRACTURE;  Surgeon: Roseanne Kaufman, MD;  Location: [facility];  Service: Orthopedics;  Laterality: Right;  WITH   ANTIBIOTIC  CEMENT  . ORIF WRIST FRACTURE Left 10/28/2013   Procedure: OPEN REDUCTION INTERNAL FIXATION (ORIF) WRIST FRACTURE with allograft;  Surgeon: Roseanne Kaufman, MD;  Location[facility];  Service: Orthopedics;  Laterality: Left;  DVR Plate  . QUADRICEPS TENDON REPAIR Left 07/15/2017   Procedure: REPAIR QUADRICEP TENDON;  Surgeon: Frederik Pear, MD;  Location: [facility];  Service:  Orthopedics;  Laterality: Left;  . right femur surgery  05/2012  . THYROIDECTOMY  02/1986  . TOTAL HIP REVISION  10/05/2012   Procedure: TOTAL HIP REVISION;  Surgeon: [name], MD;  Location: [facility];  Service: Orthopedics;  Laterality: Right;  RIGHT TOTAL HIP REVISION  . TOTAL HIP REVISION Right 09/17/2013   Procedure: REVISION RIGHT TOTAL HIP ARTHROPLASTY ;  Surgeon: [name], MD;  Location: [facility];  Service: Orthopedics;  Laterality: Right;  . TOTAL HIP REVISION Right 10/26/2013   Procedure: REVISION RIGHT TOTAL HIP ARTHROPLASTY;  Surgeon: [name], MD;  Location: [facility];  Service: Orthopedics;  Laterality: Right;  . TOTAL HIP REVISION  03/2016  . TOTAL KNEE REVISION Left 04/11/2017   Procedure: TOTAL KNEE REVISION PATELLA and TIBIA;  Surgeon: Frederik Pear, MD;  Location: [facility];  Service: Orthopedics;  Laterality: Left;  . WRIST FRACTURE SURGERY  05/2012    There were no vitals filed for this visit.  Subjective Assessment - 02/15/19 1449    Subjective  Pt states he is doing well.  He has been doing his home exercise and feels that he can tell he is getting some better overall but is still challenged by advanced balance where he is in a staggered stance and reaches with the opposite arm.  He did not have a chance to try the heel lift on his shoe yet     Patient is accompained by:  Family member   husband [name] assists with exercise and holding phone camera   Pertinent History  2013 fall resulting in a femur break on the Rt leg resulting in multiple surgeries due to failures including hip replacements another broken tibia leading to the discovery of osteoporosis and a metabolic bone disease.  After forteo injections x 2 years he is now in the osteopenia category.  Has a new scan coming up in a few weeks.  prior to the cancer diagnosis he was able to be confident with a cane.  Then was diagnosed with diffuse non-hogkin large cell B-lymphoma 07/09/18 located in tonsils and Rt groin.  Treatment consists of an infusion every 3 weeks hopefully for a total of 5, A-fib controlled, HTN controlled with medication    Limitations  Walking;Standing;House hold activities    Patient Stated Goals  to get balance back and continue with strengthening.     Currently in Pain?  Yes    Pain Score  4     Pain Location  Hip    Pain Orientation  Right    Pain Descriptors / Indicators  Aching    Pain Type  Chronic pain    Aggravating Factors   pt states it is from lying on right side while sleeping with head of bed elevated.  He will try to lower head of bed so he is lying flat in sidelying to take pressure off of hip     Pain Relieving Factors  He will try to lower head of bed so he is lying flat in sidelying to take pressure off of hip                        [facility] Adult PT Treatment/Exercise - 02/15/19 0001      Bed Mobility   Bed Mobility  Rolling Right;Rolling Left;Supine to Sit    Rolling Right  Independent    Rolling Left  Independent    Sit to Supine  Independent      High Level Balance   High Level Balance Comments  standing  in front of bed with walker nearby, but hands not on walker for 5 mintues total while doing deep breathing, shoulder rolls., head to each side and diagonally up, arms out the side and up unilatelly and bialterally.  Pt had only one instance of balance loss and c/o hip pain in last 30 seconds       Knee/Hip Exercises: Supine   Bridges  Strengthening;Right;Left;10 reps    Other Supine Knee/Hip Exercises  knee drop with hold at 3 points and drag it back in  10 with each leg     Other Supine Knee/Hip Exercises  bent knee to chest x 5 reps with each leg       Knee/Hip Exercises: Sidelying  Hip ABduction  Strengthening;Right;Left;10 reps    Other Sidelying Knee/Hip Exercises  knee to chest with strong push out    pt had difficulty doing this with right leg      Knee/Hip Exercises: Prone   Hamstring Curl  10 reps    Other Prone Exercises  with  knee flexed to 90 and foot toward ceiling for ankle dorsi and plantarflexion.  Pt had more difficulty with right leg     Other Prone Exercises  pt was able to push up into quadruped position and do weight shift back and forth.    good use of arms, pain in knee               PT Short Term Goals - 02/01/19 1611      PT SHORT TERM GOAL #1   Title  Episodes of "buckling" will decrease to one time a day.     Time  8    Period  Weeks    Status  On-going      PT SHORT TERM GOAL #2   Title  Pt will report he is doing strengthening exercise daily.     Status  Achieved      PT SHORT TERM GOAL #3   Title  Pt will be able to complete or attempt to complete all tasks fo teh BERG balace assessment indicating a decrease in movment anxiety     Time  8    Period  Weeks    Status  On-going        PT Long Term Goals - 02/01/19 1611      PT LONG TERM GOAL #1   Title  Pt will improve sit to stand x 30sec from 21" surface to 8 times     Baseline  09/28/2018  7 times in 30 sec. , 5 times on 12/04/2018     Time  8    Period  Weeks    Status  On-going      PT LONG TERM GOAL #2   Title  Pt will improve Berg Balance Assessment  to at least 35    Baseline  29 on re-eval     Time  8    Period  Weeks    Status  On-going      PT LONG TERM GOAL #3   Title  Pt will report he is able to go up and down the steps to enter his home without supervision     Status  Achieved      PT LONG TERM GOAL #4   Title  pt will be able to walk for 6 minutes without a rest break     Baseline  on 09/28/2018  18.5 sec , 12/03/2018  Pt reports he is walking 5-8 minutes at home in his neighborhood, some which is up[place]     Status  Achieved   per pt report      PT LONG TERM GOAL #5   Title  Pt will report no instances of Rt LE giving out x 5 days at home     Baseline  still has difficulty with both legs buckling, but he feels he can control it better     Time  8    Status  On-going      PT LONG TERM GOAL #6    Title  Pt be able to perform one SLR on left leg to 8 inches with no terminal extension lag  Time  8    Period  Weeks    Status  On-going            Plan - 02/15/19 1508    Clinical Impression Statement  Taevin continues to make improvment weekly.  It is clear he is doing home exercise for strengthening and endurance.  Most impresssive is his body confidence and willingness to get in new positions (prone and quadruped) and more ease in transitional movmements in bed mobility.  He is also increasing in standing balance without a device and reports quicker response times to correct balance if he begins to "wobble"    Clinical Impairments Affecting Rehab Potential  history of Rt hip fractures and functional impairment     PT Frequency  2x / week    PT Duration  8 weeks    PT Treatment/Interventions  ADLs/Self Care Home Management;Therapeutic exercise;Patient/family education;Gait training;Stair training;Balance training;Neuromuscular re-education    PT Next Visit Plan  check to see if pt has tried out his heel lifts. If so assess gait and continue with standing work especially with advanced balance work at the rail in his home.  Consider suing a ball or meltal bottlw to stretch arches of feet  Or, do supine, sidelying. prone work on bed, or, practice controlled falling and getting up from the floor.     Consulted and Agree with Plan of Care  Patient    Family Member Consulted  [name]       Patient will benefit from skilled therapeutic intervention in order to improve the following deficits and impairments:  Abnormal gait, Decreased activity tolerance, Decreased endurance, Decreased strength, Decreased balance, Difficulty walking, Cardiopulmonary status limiting activity, Impaired perceived functional ability, Obesity, Postural dysfunction  Visit Diagnosis: Other abnormalities of gait and mobility  History of falling  Muscle weakness (generalized)     Problem List Patient  Active Problem List   Diagnosis Date Noted  . Generalized weakness 11/08/2018  . Well adult exam 10/02/2018  . Counseling regarding advance care planning and goals of care 07/09/2018  . Diffuse large B-cell lymphoma ([place]) 06/23/2018  . Port-A-Cath in place 06/23/2018  . Hypogonadism in male 11/22/2017  . Nephrolithiasis 10/24/2017  . Recurrent dislocation of left patella 07/15/2017  . Recurrent subluxation of patella, left 07/08/2017  . S/P revision of total knee 04/11/2017  . Failed total knee, left, initial encounter ([place]) 04/09/2017  . Renal calculus, right 02/08/2017  . Protein-calorie malnutrition, severe ([place]) 12/08/2016  . Chronic obstructive pulmonary disease ([place]) 12/08/2016  . Staghorn renal calculus 11/05/2016  . Wound drainage right hip 11/15/2013  . Expected blood loss anemia 09/18/2013  . Hyponatremia 09/18/2013  . S/P right TH revision 09/17/2013  . Nonunion, fracture, Right tibia  07/03/2013  . UTI (urinary tract infection) 07/03/2013  . Pathological fracture due to osteoporosis with delayed healing 04/24/2013  . Osteoporosis with fracture 04/24/2013  . Rash and nonspecific skin eruption 03/08/2013  . Syncope 02/21/2013  . PAF (paroxysmal atrial fibrillation) ([place]) 02/21/2013  . Chronic anticoagulation 02/21/2013  . Fall 02/08/2013  . Physical deconditioning 02/08/2013  . Low back pain radiating to both legs 02/08/2013  . Compression fracture of L1 lumbar vertebra (HCC) 02/08/2013  . Right tibial fracture 02/05/2013  . Obesity, unspecified 02/05/2013  . Osteomyelitis of right wrist ([place]) 11/02/2012  . Anemia 10/06/2012  . HTN (hypertension) 10/03/2012  . Psoriasis 10/03/2012  . Dyslipidemia 10/03/2012  . Vitamin D deficiency 10/03/2012  . GERD (gastroesophageal reflux disease) 10/03/2012  . Hyperglycemia  10/03/2012  . Anxiety 10/03/2012  . Depression 10/03/2012  . CAD (coronary artery disease) 10/03/2012  . DJD (degenerative joint disease) 10/03/2012  .  Chronic gingivitis 10/03/2012  . Low testosterone, possible hypogonadism 10/01/2012  . Normocytic anemia 09/30/2012  . Right femoral fracture ([place]) 09/29/2012  . Right forearm fracture 09/29/2012  . Atrial fibrillation ([place]) 09/29/2012  . Hypothyroidism 09/29/2012  . History of recurrent UTIs 09/29/2012   Donato Heinz. Owens Shark PT  Norwood Levo 02/15/2019, 3:23 PM  [place], [place], [address] Phone: [phone]   Fax:  [phone]  Name: CORDARRIUS COAD MRN: [id number] Date of Birth: 12-28-1945

## 2019-02-20 ENCOUNTER — Ambulatory Visit: Payer: Medicare Other | Admitting: Cardiovascular Disease

## 2019-02-21 ENCOUNTER — Other Ambulatory Visit: Payer: Self-pay | Admitting: Urology

## 2019-02-22 ENCOUNTER — Encounter: Payer: Self-pay | Admitting: Physical Therapy

## 2019-02-22 ENCOUNTER — Other Ambulatory Visit: Payer: Self-pay

## 2019-02-22 ENCOUNTER — Ambulatory Visit: Payer: Medicare Other | Admitting: Physical Therapy

## 2019-02-22 DIAGNOSIS — R2689 Other abnormalities of gait and mobility: Secondary | ICD-10-CM | POA: Diagnosis not present

## 2019-02-22 DIAGNOSIS — M6281 Muscle weakness (generalized): Secondary | ICD-10-CM

## 2019-02-22 DIAGNOSIS — Z9181 History of falling: Secondary | ICD-10-CM

## 2019-02-22 NOTE — Therapy (Signed)
[place], [place], [address] Phone: [phone]   Fax:  [phone]  Physical Therapy Treatment  Patient Details  Name: [name] MRN: [id number] Date of Birth: 07-13-1946 Referring Provider (PT): Dr. Irene Limbo  Physical Therapy Telehealth Visit:  I connected with [name]  today at 1:10 pm by [facility] video conference and verified that I am speaking with the correct person using two identifiers.  I discussed the limitations, risks, security and privacy concerns of performing an evaluation and management service by Webex and the availability of in person appointments.  I also discussed with the patient that there may be a patient responsible charge related to this service. The patient expressed understanding and agreed to proceed.    The patient's address was confirmed.  Identified to the patient that therapist is a licensed PT in the state of [place].  Verified phone # as [phone] to call in case of technical difficulties.   Encounter Date: 02/22/2019  PT End of Session - 02/22/19 1434    Visit Number  27    Number of Visits  48    Date for PT Re-Evaluation  04/05/19    PT Start Time  1310    PT Stop Time  1355    PT Time Calculation (min)  45 min    Activity Tolerance  Patient tolerated treatment well    Behavior During Therapy  WFL for tasks assessed/performed       Past Medical History:  Diagnosis Date  . A-fib (HCC)    hx.- sinus rhythm now  . Acid reflux   . AKI (acute kidney injury) (HCC)    mild  . Anxiety   . Arthritis   . Atrial dilatation, bilateral    Severly, enlargement  per ECHO 11/03/16  . BPH (benign prostatic hyperplasia)   . CAD (coronary artery disease)   . Cancer ([place])   . Depression   . Dislocation of internal right hip prosthesis, sequela   . Dysrhythmia    hx a fib  . Elevated brain natriuretic peptide (BNP) level   . Environmental allergies   . Fracture of  wrist 10/23/13   LEFT  . High cholesterol   . History of Graves' disease   . History of kidney stones    surg. removal 02/2017  . History of syncope   . HLD (hyperlipidemia)   . Hypertension   . Hypothyroidism    thyroid removed  . Left knee injury    cap dislocated  . Low testosterone, possible hypogonadism 10/01/2012  . Lumbago   . Metabolic bone disease   . Multiple falls   . Osteoporosis    Severe  . Other abnormalities of gait and mobility   . Plaque psoriasis   . Sleep apnea    cpap machine-settings 17  . Thrombocytopenia ([place])   . Thyroid disease    HX GRAVES DISEASE  . Transfusion history    s/p 12'13 hip surgery  . Unsteady gait   . UTI (urinary tract infection)   . Vertebral compression fracture (HCC)    L1-wears brace    Past Surgical History:  Procedure Laterality Date  . APPENDECTOMY    . CARDIAC CATHETERIZATION  2013  . CATARACT EXTRACTION, BILATERAL    . COLONOSCOPY    . CYSTOSCOPY WITH RETROGRADE PYELOGRAM, URETEROSCOPY AND STENT PLACEMENT Left 11/07/2017   Procedure: CYSTOSCOPY WITH RETROGRADE PYELOGRAM, URETEROSCOPY AND STENT PLACEMENT;  Surgeon: Cleon Gustin, MD;  Location:  [facility];  Service: Urology;  Laterality: Left;  . CYSTOSCOPY/URETEROSCOPY/HOLMIUM LASER/STENT PLACEMENT Right 03/11/2017   Procedure: CYSTOSCOPY/URETEROSCOPYSTENT PLACEMENT right ureter retrograde pylegram;  Surgeon: Cleon Gustin, MD;  Location: [facility];  Service: Urology;  Laterality: Right;  . HARDWARE REMOVAL  10/05/2012   Procedure: HARDWARE REMOVAL;  Surgeon: [name], MD;  Location: [facility];  Service: Orthopedics;  Laterality: Right;  REMOVING  STRYKER  GAMMA NAIL  . HARDWARE REMOVAL Right 07/03/2013   Procedure: HARDWARE REMOVAL RIGHT TIBIA ;  Surgeon: Rozanna Box, MD;  Location: [facility];  Service: Orthopedics;  Laterality: Right;  . HIP CLOSED REDUCTION Right 10/15/2013   Procedure: CLOSED MANIPULATION HIP;  Surgeon: [name], MD;  Location: [facility];   Service: Orthopedics;  Laterality: Right;  . hip revision     June 2017  . HOLMIUM LASER APPLICATION Right 12/14/91   Procedure: HOLMIUM LASER APPLICATION;  Surgeon: Cleon Gustin, MD;  Location: [facility];  Service: Urology;  Laterality: Right;  . INCISION AND DRAINAGE HIP Right 11/16/2013   Procedure: IRRIGATION AND DEBRIDEMENT RIGHT HIP;  Surgeon: [name], MD;  Location: [facility];  Service: Orthopedics;  Laterality: Right;  . IR IMAGING GUIDED PORT INSERTION  06/22/2018  . IR URETERAL STENT LEFT NEW ACCESS W/O SEP NEPHROSTOMY CATH  10/24/2017  . JOINT REPLACEMENT     hip-right x2  . KNEE SURGERY Bilateral 2012   Total knee replacements  . NEPHROLITHOTOMY Right 02/08/2017   Procedure: NEPHROLITHOTOMY PERCUTANEOUS WITH SURGEON ACCESS;  Surgeon: Cleon Gustin, MD;  Location: [facility];  Service: Urology;  Laterality: Right;  . NEPHROLITHOTOMY Left 10/24/2017   Procedure: NEPHROLITHOTOMY PERCUTANEOUS;  Surgeon: Cleon Gustin, MD;  Location[facility];  Service: Urology;  Laterality: Left;  . ORIF TIBIA FRACTURE Right 02/06/2013   Procedure: OPEN REDUCTION INTERNAL FIXATION (ORIF) TIBIA FRACTURE WITH IM ROD FIBULA;  Surgeon: Rozanna Box, MD;  Location: [place] [facility];  Service: Orthopedics;  Laterality: Right;  . ORIF TIBIA FRACTURE Right 07/03/2013   Procedure: RIGHT TIBIA NON UNION REPAIR ;  Surgeon: Rozanna Box, MD;  Location: [place];  Service: Orthopedics;  Laterality: Right;  . ORIF WRIST FRACTURE  10/02/2012   Procedure: OPEN REDUCTION INTERNAL FIXATION (ORIF) WRIST FRACTURE;  Surgeon: Roseanne Kaufman, MD;  Location[facility];  Service: Orthopedics;  Laterality: Right;  WITH   ANTIBIOTIC  CEMENT  . ORIF WRIST FRACTURE Left 10/28/2013   Procedure: OPEN REDUCTION INTERNAL FIXATION (ORIF) WRIST FRACTURE with allograft;  Surgeon: Roseanne Kaufman, MD;  Location[facility];  Service: Orthopedics;  Laterality: Left;  DVR Plate  . QUADRICEPS TENDON REPAIR Left 07/15/2017   Procedure: REPAIR QUADRICEP  TENDON;  Surgeon: Frederik Pear, MD;  Location: [facility];  Service: Orthopedics;  Laterality: Left;  . right femur surgery  05/2012  . THYROIDECTOMY  02/1986  . TOTAL HIP REVISION  10/05/2012   Procedure: TOTAL HIP REVISION;  Surgeon: [name], MD;  Location: [facility];  Service: Orthopedics;  Laterality: Right;  RIGHT TOTAL HIP REVISION  . TOTAL HIP REVISION Right 09/17/2013   Procedure: REVISION RIGHT TOTAL HIP ARTHROPLASTY ;  Surgeon: [name], MD;  Location[facility];  Service: Orthopedics;  Laterality: Right;  . TOTAL HIP REVISION Right 10/26/2013   Procedure: REVISION RIGHT TOTAL HIP ARTHROPLASTY;  Surgeon: [name], MD;  Location[facility];  Service: Orthopedics;  Laterality: Right;  . TOTAL HIP REVISION  03/2016  . TOTAL KNEE REVISION Left  04/11/2017   Procedure: TOTAL KNEE REVISION PATELLA and TIBIA;  Surgeon: Frederik Pear, MD;  Location: [facility];  Service: Orthopedics;  Laterality: Left;  . WRIST FRACTURE SURGERY  05/2012    There were no vitals filed for this visit.  Subjective Assessment - 02/22/19 1426    Subjective  Pt states he really "upped" doing his exercises last week and was afraid he was doing too much.  He had 2 episodes of falls on the steps last week, but was able to get himself up.  He said he had "jelly legs"  and it "just came out of nowhere"     Pertinent History  2013 fall resulting in a femur break on the Rt leg resulting in multiple surgeries due to failures including hip replacements another broken tibia leading to the discovery of osteoporosis and a metabolic bone disease.  After forteo injections x 2 years he is now in the osteopenia category.  Has a new scan coming up in a few weeks.  prior to the cancer diagnosis he was able to be confident with a cane.  Then was diagnosed with diffuse non-hogkin large cell B-lymphoma 07/09/18 located in tonsils and Rt groin. Treatment consists of an infusion every 3 weeks hopefully for a total of 5, A-fib controlled, HTN  controlled with medication    Patient Stated Goals  to get balance back and continue with strengthening.     Currently in Pain?  Yes    Pain Score  --   did not rate    Pain Location  Buttocks   from his fall                       [facility] Adult PT Treatment/Exercise - 02/22/19 0001      High Level Balance   High Level Balance Comments  standing  in front of chair  with walker nearby, but hands not on walker for 6 mintues total while doing deep breathing, shoulder rolls., head to each side and diagonally up, arms out the side and up unilatelly and bialterally.  Pt had one episode of balance loss resulting in fall backward onto chair when he has quick contraction and release of gluteal set       Elbow Exercises   Elbow Flexion  Strengthening;Right;Left;10 reps;Theraband    Theraband Level (Elbow Flexion)  Level 3 (Green)      Lumbar Exercises: Seated   Other Seated Lumbar Exercises  forward hinge with flat low back x 10 reps       Shoulder Exercises: Seated   Diagonals  Strengthening;Right;Left;10 reps;Theraband    Theraband Level (Shoulder Diagonals)  Level 3 (Green)      Ankle Exercises: Seated   Heel Raises  Right;Left;10 reps   cues to keep right great toe down MCPjoint extension   Other Seated Ankle Exercises  toe flexion and extension                PT Short Term Goals - 02/01/19 1611      PT SHORT TERM GOAL #1   Title  Episodes of "buckling" will decrease to one time a day.     Time  8    Period  Weeks    Status  On-going      PT SHORT TERM GOAL #2   Title  Pt will report he is doing strengthening exercise daily.     Status  Achieved      PT SHORT TERM GOAL #  3   Title  Pt will be able to complete or attempt to complete all tasks fo teh BERG balace assessment indicating a decrease in movment anxiety     Time  8    Period  Weeks    Status  On-going        PT Long Term Goals - 02/01/19 1611      PT LONG TERM GOAL #1   Title  Pt will  improve sit to stand x 30sec from 21" surface to 8 times     Baseline  09/28/2018  7 times in 30 sec. , 5 times on 12/04/2018     Time  8    Period  Weeks    Status  On-going      PT LONG TERM GOAL #2   Title  Pt will improve Berg Balance Assessment  to at least 35    Baseline  29 on re-eval     Time  8    Period  Weeks    Status  On-going      PT LONG TERM GOAL #3   Title  Pt will report he is able to go up and down the steps to enter his home without supervision     Status  Achieved      PT LONG TERM GOAL #4   Title  pt will be able to walk for 6 minutes without a rest break     Baseline  on 09/28/2018  18.5 sec , 12/03/2018  Pt reports he is walking 5-8 minutes at home in his neighborhood, some which is up[place]     Status  Achieved   per pt report      PT LONG TERM GOAL #5   Title  Pt will report no instances of Rt LE giving out x 5 days at home     Baseline  still has difficulty with both legs buckling, but he feels he can control it better     Time  8    Status  On-going      PT LONG TERM GOAL #6   Title  Pt be able to perform one SLR on left leg to 8 inches with no terminal extension lag     Time  8    Period  Weeks    Status  On-going            Plan - 02/22/19 1434    Clinical Impression Statement  Pt has had 2 episodes of falls this week despite working on strengthening exercises in legs.  After talking it through, he acknowledged he may not be focusing completely on what he is doing a the time of his falls.  He will try to slow down and be more mindful of his body position and control this week to avoid falls.  We also talked about having a rest day between exercise sessions or doing bed exercises one day and standing exercises the next day.  It is clear he is trying to exercise more and follow through at home and seems to be improving in strength in his upper body.  He continues to struggle with proximal hip and leg strenght affecting his walking.  He is trying out  his heel lift this week and plans to walk outside with it in the better weather due this weekend.     Clinical Impairments Affecting Rehab Potential  history of Rt hip fractures and functional impairment     PT Treatment/Interventions  ADLs/Self Care Home Management;Therapeutic exercise;Patient/family education;Gait training;Stair training;Balance training;Neuromuscular re-education    PT Next Visit Plan  check to see if pt has tried out his heel lifts. If so assess gait and continue with standing work especially with advanced balance work at the rail in his home.  Consider suing a ball or meltal bottlw to stretch arches of feet  Or, do supine, sidelying. prone work on bed, or, practice controlled falling and getting up from the floor.     PT Home Exercise Plan  [email or web address], Access Code: [email or web address] on 4/23/20202; [email or web address] upgraded to add shoulder resistance exercise 02/08/2019       Patient will benefit from skilled therapeutic intervention in order to improve the following deficits and impairments:  Abnormal gait, Decreased activity tolerance, Decreased endurance, Decreased strength, Decreased balance, Difficulty walking, Cardiopulmonary status limiting activity, Impaired perceived functional ability, Obesity, Postural dysfunction  Visit Diagnosis: Other abnormalities of gait and mobility  History of falling  Muscle weakness (generalized)     Problem List Patient Active Problem List   Diagnosis Date Noted  . Generalized weakness 11/08/2018  . Well adult exam 10/02/2018  . Counseling regarding advance care planning and goals of care 07/09/2018  . Diffuse large B-cell lymphoma ([place]) 06/23/2018  . Port-A-Cath in place 06/23/2018  . Hypogonadism in male 11/22/2017  . Nephrolithiasis 10/24/2017  . Recurrent dislocation of left patella 07/15/2017  . Recurrent subluxation of patella, left 07/08/2017  . S/P revision of total knee 04/11/2017  . Failed total knee, left, initial encounter ([place])  04/09/2017  . Renal calculus, right 02/08/2017  . Protein-calorie malnutrition, severe ([place]) 12/08/2016  . Chronic obstructive pulmonary disease ([place]) 12/08/2016  . Staghorn renal calculus 11/05/2016  . Wound drainage right hip 11/15/2013  . Expected blood loss anemia 09/18/2013  . Hyponatremia 09/18/2013  . S/P right TH revision 09/17/2013  . Nonunion, fracture, Right tibia  07/03/2013  . UTI (urinary tract infection) 07/03/2013  . Pathological fracture due to osteoporosis with delayed healing 04/24/2013  . Osteoporosis with fracture 04/24/2013  . Rash and nonspecific skin eruption 03/08/2013  . Syncope 02/21/2013  . PAF (paroxysmal atrial fibrillation) ([place]) 02/21/2013  . Chronic anticoagulation 02/21/2013  . Fall 02/08/2013  . Physical deconditioning 02/08/2013  . Low back pain radiating to both legs 02/08/2013  . Compression fracture of L1 lumbar vertebra (HCC) 02/08/2013  . Right tibial fracture 02/05/2013  . Obesity, unspecified 02/05/2013  . Osteomyelitis of right wrist ([place]) 11/02/2012  . Anemia 10/06/2012  . HTN (hypertension) 10/03/2012  . Psoriasis 10/03/2012  . Dyslipidemia 10/03/2012  . Vitamin D deficiency 10/03/2012  . GERD (gastroesophageal reflux disease) 10/03/2012  . Hyperglycemia 10/03/2012  . Anxiety 10/03/2012  . Depression 10/03/2012  . CAD (coronary artery disease) 10/03/2012  . DJD (degenerative joint disease) 10/03/2012  . Chronic gingivitis 10/03/2012  . Low testosterone, possible hypogonadism 10/01/2012  . Normocytic anemia 09/30/2012  . Right femoral fracture ([place]) 09/29/2012  . Right forearm fracture 09/29/2012  . Atrial fibrillation ([place]) 09/29/2012  . Hypothyroidism 09/29/2012  . History of recurrent UTIs 09/29/2012   [name]. [name] Shark PT  [name] 02/22/2019, 2:51 PM  [place] [name], [place], [address] Phone: [phone]   Fax:  [phone]  Name:  [name] MRN: [id number] Date of Birth: 1946-08-10

## 2019-03-01 ENCOUNTER — Ambulatory Visit: Payer: Medicare Other | Admitting: Physical Therapy

## 2019-03-01 ENCOUNTER — Other Ambulatory Visit: Payer: Self-pay

## 2019-03-01 ENCOUNTER — Encounter: Payer: Self-pay | Admitting: Physical Therapy

## 2019-03-01 DIAGNOSIS — M6281 Muscle weakness (generalized): Secondary | ICD-10-CM

## 2019-03-01 DIAGNOSIS — R2689 Other abnormalities of gait and mobility: Secondary | ICD-10-CM | POA: Diagnosis not present

## 2019-03-01 DIAGNOSIS — Z9181 History of falling: Secondary | ICD-10-CM

## 2019-03-01 NOTE — Therapy (Signed)
[place], [place], [address] Phone: [phone]   Fax:  [phone]  Physical Therapy Treatment  Patient Details  Name: [name] MRN: [id number] Date of Birth: 1946/02/19 Referring Provider (PT): Dr. Irene Limbo  Physical Therapy Telehealth Visit:  I connected with [name]  today at 1:00 pm by [facility] video conference and verified that I am speaking with the correct person using two identifiers.  I discussed the limitations, risks, security and privacy concerns of performing an evaluation and management service by Webex and the availability of in person appointments.  I also discussed with the patient that there may be a patient responsible charge related to this service. The patient expressed understanding and agreed to proceed.    The patient's address was confirmed.  Identified to the patient that therapist is a licensed PT in the state of [place].  Verified phone # as [phone] to call in case of technical difficulties.   Encounter Date: 03/01/2019  PT End of Session - 03/01/19 1403    Visit Number  28    Number of Visits  48    Date for PT Re-Evaluation  04/05/19    PT Start Time  1302    PT Stop Time  1350    PT Time Calculation (min)  48 min    Activity Tolerance  Patient tolerated treatment well    Behavior During Therapy  [facility] for tasks assessed/performed       Past Medical History:  Diagnosis Date  . A-fib (HCC)    hx.- sinus rhythm now  . Acid reflux   . AKI (acute kidney injury) (HCC)    mild  . Anxiety   . Arthritis   . Atrial dilatation, bilateral    Severly, enlargement  per ECHO 11/03/16  . BPH (benign prostatic hyperplasia)   . CAD (coronary artery disease)   . Cancer ([place])   . Depression   . Dislocation of internal right hip prosthesis, sequela   . Dysrhythmia    hx a fib  . Elevated brain natriuretic peptide (BNP) level   . Environmental allergies   . Fracture of  wrist 10/23/13   LEFT  . High cholesterol   . History of Graves' disease   . History of kidney stones    surg. removal 02/2017  . History of syncope   . HLD (hyperlipidemia)   . Hypertension   . Hypothyroidism    thyroid removed  . Left knee injury    cap dislocated  . Low testosterone, possible hypogonadism 10/01/2012  . Lumbago   . Metabolic bone disease   . Multiple falls   . Osteoporosis    Severe  . Other abnormalities of gait and mobility   . Plaque psoriasis   . Sleep apnea    cpap machine-settings 17  . Thrombocytopenia ([place])   . Thyroid disease    HX GRAVES DISEASE  . Transfusion history    s/p 12'13 hip surgery  . Unsteady gait   . UTI (urinary tract infection)   . Vertebral compression fracture (HCC)    L1-wears brace    Past Surgical History:  Procedure Laterality Date  . APPENDECTOMY    . CARDIAC CATHETERIZATION  2013  . CATARACT EXTRACTION, BILATERAL    . COLONOSCOPY    . CYSTOSCOPY WITH RETROGRADE PYELOGRAM, URETEROSCOPY AND STENT PLACEMENT Left 11/07/2017   Procedure: CYSTOSCOPY WITH RETROGRADE PYELOGRAM, URETEROSCOPY AND STENT PLACEMENT;  Surgeon: Cleon Gustin, MD;  Location:  [facility];  Service: Urology;  Laterality: Left;  . CYSTOSCOPY/URETEROSCOPY/HOLMIUM LASER/STENT PLACEMENT Right 03/11/2017   Procedure: CYSTOSCOPY/URETEROSCOPYSTENT PLACEMENT right ureter retrograde pylegram;  Surgeon: Cleon Gustin, MD;  Location: [facility];  Service: Urology;  Laterality: Right;  . HARDWARE REMOVAL  10/05/2012   Procedure: HARDWARE REMOVAL;  Surgeon: [name], MD;  Location: [facility];  Service: Orthopedics;  Laterality: Right;  REMOVING  STRYKER  GAMMA NAIL  . HARDWARE REMOVAL Right 07/03/2013   Procedure: HARDWARE REMOVAL RIGHT TIBIA ;  Surgeon: Rozanna Box, MD;  Location: [place];  Service: Orthopedics;  Laterality: Right;  . HIP CLOSED REDUCTION Right 10/15/2013   Procedure: CLOSED MANIPULATION HIP;  Surgeon: [name], MD;  Location: [facility];   Service: Orthopedics;  Laterality: Right;  . hip revision     June 2017  . HOLMIUM LASER APPLICATION Right 11/11/3084   Procedure: HOLMIUM LASER APPLICATION;  Surgeon: Cleon Gustin, MD;  Location: [facility];  Service: Urology;  Laterality: Right;  . INCISION AND DRAINAGE HIP Right 11/16/2013   Procedure: IRRIGATION AND DEBRIDEMENT RIGHT HIP;  Surgeon: [name], MD;  Location: [facility];  Service: Orthopedics;  Laterality: Right;  . IR IMAGING GUIDED PORT INSERTION  06/22/2018  . IR URETERAL STENT LEFT NEW ACCESS W/O SEP NEPHROSTOMY CATH  10/24/2017  . JOINT REPLACEMENT     hip-right x2  . KNEE SURGERY Bilateral 2012   Total knee replacements  . NEPHROLITHOTOMY Right 02/08/2017   Procedure: NEPHROLITHOTOMY PERCUTANEOUS WITH SURGEON ACCESS;  Surgeon: Cleon Gustin, MD;  Location[facility];  Service: Urology;  Laterality: Right;  . NEPHROLITHOTOMY Left 10/24/2017   Procedure: NEPHROLITHOTOMY PERCUTANEOUS;  Surgeon: Cleon Gustin, MD;  [facility];  Service: Urology;  Laterality: Left;  . ORIF TIBIA FRACTURE Right 02/06/2013   Procedure: OPEN REDUCTION INTERNAL FIXATION (ORIF) TIBIA FRACTURE WITH IM ROD FIBULA;  Surgeon: Rozanna Box, MD;  Location: [place];  Service: Orthopedics;  Laterality: Right;  . ORIF TIBIA FRACTURE Right 07/03/2013   Procedure: RIGHT TIBIA NON UNION REPAIR ;  Surgeon: Rozanna Box, MD;  Location: [place];  Service: Orthopedics;  Laterality: Right;  . ORIF WRIST FRACTURE  10/02/2012   Procedure: OPEN REDUCTION INTERNAL FIXATION (ORIF) WRIST FRACTURE;  Surgeon: Roseanne Kaufman, MD;  Location[facility];  Service: Orthopedics;  Laterality: Right;  WITH   ANTIBIOTIC  CEMENT  . ORIF WRIST FRACTURE Left 10/28/2013   Procedure: OPEN REDUCTION INTERNAL FIXATION (ORIF) WRIST FRACTURE with allograft;  Surgeon: Roseanne Kaufman, MD;  Location[facility];  Service: Orthopedics;  Laterality: Left;  DVR Plate  . QUADRICEPS TENDON REPAIR Left 07/15/2017   Procedure: REPAIR QUADRICEP  TENDON;  Surgeon: Frederik Pear, MD;  Location: [place];  Service: Orthopedics;  Laterality: Left;  . right femur surgery  05/2012  . THYROIDECTOMY  02/1986  . TOTAL HIP REVISION  10/05/2012   Procedure: TOTAL HIP REVISION;  Surgeon: [name], MD;  Location: [facility];  Service: Orthopedics;  Laterality: Right;  RIGHT TOTAL HIP REVISION  . TOTAL HIP REVISION Right 09/17/2013   Procedure: REVISION RIGHT TOTAL HIP ARTHROPLASTY ;  Surgeon: [name], MD;  [facility];  Service: Orthopedics;  Laterality: Right;  . TOTAL HIP REVISION Right 10/26/2013   Procedure: REVISION RIGHT TOTAL HIP ARTHROPLASTY;  Surgeon: [name], MD;  [facility];  Service: Orthopedics;  Laterality: Right;  . TOTAL HIP REVISION  03/2016  . TOTAL KNEE REVISION Left  04/11/2017   Procedure: TOTAL KNEE REVISION PATELLA and TIBIA;  Surgeon: Frederik Pear, MD;  Location: [place];  Service: Orthopedics;  Laterality: Left;  . WRIST FRACTURE SURGERY  05/2012    There were no vitals filed for this visit.  Subjective Assessment - 03/01/19 1354    Subjective  Pt states he has been doing the exercises every other day and walking every day and he feels this is doing better.  He did have one fall last week when he took his hand off the walker.  He was able to scoot to a bench and push up with his arms to get onto the bench and then stand up from there.  He also is able to describe how he get up if she falls on the steps.  He has not followed up with getting the heel lift in his shoe     Pertinent History  2013 fall resulting in a femur break on the Rt leg resulting in multiple surgeries due to failures including hip replacements another broken tibia leading to the discovery of osteoporosis and a metabolic bone disease.  After forteo injections x 2 years he is now in the osteopenia category.  Has a new scan coming up in a few weeks.  prior to the cancer diagnosis he was able to be confident with a cane.  Then was diagnosed with  diffuse non-hogkin large cell B-lymphoma 07/09/18 located in tonsils and Rt groin. Treatment consists of an infusion every 3 weeks hopefully for a total of 5, A-fib controlled, HTN controlled with medication    Patient Stated Goals  to get balance back and continue with strengthening.     Currently in Pain?  No/denies                       [facility] Adult PT Treatment/Exercise - 03/01/19 0001      High Level Balance   High Level Balance Comments  standing  in front of chair  with walker nearby, but hands intermittently on walker for 6 mintues total while doing deep breathing, shoulder rolls., head to each side and diagonally up, arms out the side and up unilatelly and bialterally. pt had diffciulty with shitfing weight back onto heels    occasional one finger on walker, different speeds of arms      Exercises   Exercises  Shoulder;Lumbar;Knee/Hip;Ankle      Lumbar Exercises: Seated   Other Seated Lumbar Exercises  forward hinge with flat low back x 10 reps    pt able to keep good posture    Other Seated Lumbar Exercises  cat/cow with deep breathing for warm up and core activation       Knee/Hip Exercises: Standing   Hip Flexion  Stengthening;Right;Left;5 reps    Hip Flexion Limitations  only able to lift right heel to maintain control of left knee, if he tried to lift right foot off floor, left knee hyperextended and pt had balance loss       Shoulder Exercises: Seated   Other Seated Exercises  tricep push ups from chair 2 sets of 10     Other Seated Exercises  seated "dead bug" with alternating arm raise and knee lift x 10 reps.       Ankle Exercises: Seated   Heel Raises  Left;10 reps               PT Short Term Goals - 02/01/19 1611  PT SHORT TERM GOAL #1   Title  Episodes of "buckling" will decrease to one time a day.     Time  8    Period  Weeks    Status  On-going      PT SHORT TERM GOAL #2   Title  Pt will report he is doing strengthening  exercise daily.     Status  Achieved      PT SHORT TERM GOAL #3   Title  Pt will be able to complete or attempt to complete all tasks fo teh BERG balace assessment indicating a decrease in movment anxiety     Time  8    Period  Weeks    Status  On-going        PT Long Term Goals - 02/01/19 1611      PT LONG TERM GOAL #1   Title  Pt will improve sit to stand x 30sec from 21" surface to 8 times     Baseline  09/28/2018  7 times in 30 sec. , 5 times on 12/04/2018     Time  8    Period  Weeks    Status  On-going      PT LONG TERM GOAL #2   Title  Pt will improve Berg Balance Assessment  to at least 35    Baseline  29 on re-eval     Time  8    Period  Weeks    Status  On-going      PT LONG TERM GOAL #3   Title  Pt will report he is able to go up and down the steps to enter his home without supervision     Status  Achieved      PT LONG TERM GOAL #4   Title  pt will be able to walk for 6 minutes without a rest break     Baseline  on 09/28/2018  18.5 sec , 12/03/2018  Pt reports he is walking 5-8 minutes at home in his neighborhood, some which is uphill     Status  Achieved   per pt report      PT LONG TERM GOAL #5   Title  Pt will report no instances of Rt LE giving out x 5 days at home     Baseline  still has difficulty with both legs buckling, but he feels he can control it better     Time  8    Status  On-going      PT LONG TERM GOAL #6   Title  Pt be able to perform one SLR on left leg to 8 inches with no terminal extension lag     Time  8    Period  Weeks    Status  On-going            Plan - 03/01/19 1404    Clinical Impression Statement  Pt is showing progress in UE and core strength.  He still has difficulty with knee control of left knee is stance. this seems to be the issue that contributes to his falls.  Pt was able to work on controlling his knee by just raising his heel to increase weight bearing on his left leg.  He is working hard in weekly therapy and  wants to continue PT     Clinical Impairments Affecting Rehab Potential  history of Rt hip fractures and functional impairment     PT Frequency  2x / week  PT Duration  8 weeks    PT Next Visit Plan  Review goals. check to see if pt has tried out his heel lifts. If so assess gait and continue with standing work especially with advanced balance work at the rail in his home.  Consider suing a ball or meltal bottlw to stretch arches of feet  Or, do supine, sidelying. prone work on bed, or, practice controlled falling and getting up from the floor.     Consulted and Agree with Plan of Care  Patient       Patient will benefit from skilled therapeutic intervention in order to improve the following deficits and impairments:  Abnormal gait, Decreased activity tolerance, Decreased endurance, Decreased strength, Decreased balance, Difficulty walking, Cardiopulmonary status limiting activity, Impaired perceived functional ability, Obesity, Postural dysfunction  Visit Diagnosis: Other abnormalities of gait and mobility  History of falling  Muscle weakness (generalized)     Problem List Patient Active Problem List   Diagnosis Date Noted  . Generalized weakness 11/08/2018  . Well adult exam 10/02/2018  . Counseling regarding advance care planning and goals of care 07/09/2018  . Diffuse large B-cell lymphoma ([place]) 06/23/2018  . Port-A-Cath in place 06/23/2018  . Hypogonadism in male 11/22/2017  . Nephrolithiasis 10/24/2017  . Recurrent dislocation of left patella 07/15/2017  . Recurrent subluxation of patella, left 07/08/2017  . S/P revision of total knee 04/11/2017  . Failed total knee, left, initial encounter ([place]) 04/09/2017  . Renal calculus, right 02/08/2017  . Protein-calorie malnutrition, severe ([place]) 12/08/2016  . Chronic obstructive pulmonary disease ([place]) 12/08/2016  . Staghorn renal calculus 11/05/2016  . Wound drainage right hip 11/15/2013  . Expected blood loss anemia  09/18/2013  . Hyponatremia 09/18/2013  . S/P right TH revision 09/17/2013  . Nonunion, fracture, Right tibia  07/03/2013  . UTI (urinary tract infection) 07/03/2013  . Pathological fracture due to osteoporosis with delayed healing 04/24/2013  . Osteoporosis with fracture 04/24/2013  . Rash and nonspecific skin eruption 03/08/2013  . Syncope 02/21/2013  . PAF (paroxysmal atrial fibrillation) ([place]) 02/21/2013  . Chronic anticoagulation 02/21/2013  . Fall 02/08/2013  . Physical deconditioning 02/08/2013  . Low back pain radiating to both legs 02/08/2013  . Compression fracture of L1 lumbar vertebra (HCC) 02/08/2013  . Right tibial fracture 02/05/2013  . Obesity, unspecified 02/05/2013  . Osteomyelitis of right wrist ([place]) 11/02/2012  . Anemia 10/06/2012  . HTN (hypertension) 10/03/2012  . Psoriasis 10/03/2012  . Dyslipidemia 10/03/2012  . Vitamin D deficiency 10/03/2012  . GERD (gastroesophageal reflux disease) 10/03/2012  . Hyperglycemia 10/03/2012  . Anxiety 10/03/2012  . Depression 10/03/2012  . CAD (coronary artery disease) 10/03/2012  . DJD (degenerative joint disease) 10/03/2012  . Chronic gingivitis 10/03/2012  . Low testosterone, possible hypogonadism 10/01/2012  . Normocytic anemia 09/30/2012  . Right femoral fracture ([place]) 09/29/2012  . Right forearm fracture 09/29/2012  . Atrial fibrillation ([place]) 09/29/2012  . Hypothyroidism 09/29/2012  . History of recurrent UTIs 09/29/2012   Donato Heinz. Owens Shark PT  Norwood Levo 03/01/2019, 2:13 PM  [place], [place], [address] Phone: [phone]   Fax:  [phone]  Name: MOISE FRIDAY MRN: [id number] Date of Birth: Feb 14, 1946

## 2019-03-07 ENCOUNTER — Ambulatory Visit (INDEPENDENT_AMBULATORY_CARE_PROVIDER_SITE_OTHER): Payer: Medicare Other | Admitting: Internal Medicine

## 2019-03-07 ENCOUNTER — Encounter: Payer: Self-pay | Admitting: Internal Medicine

## 2019-03-07 DIAGNOSIS — C8330 Diffuse large B-cell lymphoma, unspecified site: Secondary | ICD-10-CM | POA: Diagnosis not present

## 2019-03-07 DIAGNOSIS — M545 Low back pain, unspecified: Secondary | ICD-10-CM

## 2019-03-07 DIAGNOSIS — E559 Vitamin D deficiency, unspecified: Secondary | ICD-10-CM | POA: Diagnosis not present

## 2019-03-07 DIAGNOSIS — I480 Paroxysmal atrial fibrillation: Secondary | ICD-10-CM | POA: Diagnosis not present

## 2019-03-07 DIAGNOSIS — E291 Testicular hypofunction: Secondary | ICD-10-CM

## 2019-03-07 MED ORDER — HYDROCODONE-ACETAMINOPHEN 7.5-325 MG PO TABS: 1.0000 | ORAL_TABLET | Freq: Four times a day (QID) | ORAL | 0 refills | Status: DC | PRN

## 2019-03-07 MED ORDER — DOXYCYCLINE HYCLATE 100 MG PO TABS: 100.0000 mg | ORAL_TABLET | Freq: Every day | ORAL | 3 refills | Status: DC

## 2019-03-07 NOTE — Assessment & Plan Note (Signed)
Norco prn  Potential benefits of a long term opioids use as well as potential risks (i.e. addiction risk, apnea etc) and complications (i.e. Somnolence, constipation and others) were explained to the patient and were aknowledged. PT exercises at home

## 2019-03-07 NOTE — Progress Notes (Signed)
Virtual Visit via Video Note  I connected with [name] on 03/07/19 at  3:00 PM EDT by a video enabled telemedicine application and verified that I am speaking with the correct person using two identifiers.   I discussed the limitations of evaluation and management by telemedicine and the availability of in person appointments. The patient expressed understanding and agreed to proceed.  History of Present Illness: We need to follow-up on atrial fibrillation, chronic low back pain, chronic hypogonadism  There has been no runny nose, cough, chest pain,  abdominal pain, diarrhea, constipation, skin rashes.   Observations/Objective: The patient appears to be in no acute distress, looks well.  Assessment and Plan:  See my Assessment and Plan. Follow Up Instructions:    I discussed the assessment and treatment plan with the patient. The patient was provided an opportunity to ask questions and all were answered. The patient agreed with the plan and demonstrated an understanding of the instructions.   The patient was advised to call back or seek an in-person evaluation if the symptoms worsen or if the condition fails to improve as anticipated.  I provided face-to-face time during this encounter. We were at different locations.   Walker Kehr, MD

## 2019-03-07 NOTE — Assessment & Plan Note (Signed)
On vitamin D 

## 2019-03-07 NOTE — Assessment & Plan Note (Signed)
On Xarelto 

## 2019-03-07 NOTE — Assessment & Plan Note (Signed)
On chemotherapy 

## 2019-03-07 NOTE — Assessment & Plan Note (Signed)
On Testosterone  Potential benefits of a long term testosterone use as well as potential risks (BPH, MI, OSA, cancer)  and complications were explained to the patient and were aknowledged. 

## 2019-03-08 ENCOUNTER — Encounter: Payer: Self-pay | Admitting: Physical Therapy

## 2019-03-08 ENCOUNTER — Other Ambulatory Visit: Payer: Self-pay

## 2019-03-08 ENCOUNTER — Ambulatory Visit: Payer: Medicare Other | Admitting: Physical Therapy

## 2019-03-08 DIAGNOSIS — R2689 Other abnormalities of gait and mobility: Secondary | ICD-10-CM | POA: Diagnosis not present

## 2019-03-08 DIAGNOSIS — Z9181 History of falling: Secondary | ICD-10-CM

## 2019-03-08 DIAGNOSIS — M6281 Muscle weakness (generalized): Secondary | ICD-10-CM

## 2019-03-08 NOTE — Patient Instructions (Signed)
Access Code: [email or web address]  URL: [email or web address]  Date: 03/08/2019  Prepared by: Maudry Diego   Exercises Standing Partial Lunge - 10 reps - 1 sets - 1x daily - 3-5x weekly Standing Partial Squat - 10 reps - 1 sets - 1x daily - 3 - 5x weekly Standing Hip Hinge with Dowel - 10 reps - 1 sets - 1x daily - 3-5x weekly Standing Bilateral Low Shoulder Row with Anchored Resistance - 10 reps - 3 sets - 1x daily - 3-5x weekly  Program added to previous program and sent via email to [email or web address] per pt direction

## 2019-03-08 NOTE — Therapy (Signed)
[place], [place], [address] Phone: [phone]   Fax:  [phone]  Physical Therapy Treatment  Patient Details  Name: [name] MRN: [id number] Date of Birth: Dec 09, 1945 Referring Provider (PT): Dr. Irene Limbo  Physical Therapy Telehealth Visit:  I connected with [name]  today at 1:00 pm  by [facility] video conference and verified that I am speaking with the correct person using two identifiers.  I discussed the limitations, risks, security and privacy concerns of performing an evaluation and management service by Webex and the availability of in person appointments.  I also discussed with the patient that there may be a patient responsible charge related to this service. The patient expressed understanding and agreed to proceed.    The patient's address was confirmed.  Identified to the patient that therapist is a licensed PT in the state of [place].     Encounter Date: 03/08/2019  PT End of Session - 03/08/19 1734    Visit Number  29    Number of Visits  48    Date for PT Re-Evaluation  04/05/19    PT Start Time  1300    PT Stop Time  1345    PT Time Calculation (min)  45 min    Activity Tolerance  Patient tolerated treatment well    Behavior During Therapy  [facility] for tasks assessed/performed       Past Medical History:  Diagnosis Date  . A-fib (HCC)    hx.- sinus rhythm now  . Acid reflux   . AKI (acute kidney injury) (HCC)    mild  . Anxiety   . Arthritis   . Atrial dilatation, bilateral    Severly, enlargement  per ECHO 11/03/16  . BPH (benign prostatic hyperplasia)   . CAD (coronary artery disease)   . Cancer ([place])   . Depression   . Dislocation of internal right hip prosthesis, sequela   . Dysrhythmia    hx a fib  . Elevated brain natriuretic peptide (BNP) level   . Environmental allergies   . Fracture of wrist 10/23/13   LEFT  . High cholesterol   . History of Graves'  disease   . History of kidney stones    surg. removal 02/2017  . History of syncope   . HLD (hyperlipidemia)   . Hypertension   . Hypothyroidism    thyroid removed  . Left knee injury    cap dislocated  . Low testosterone, possible hypogonadism 10/01/2012  . Lumbago   . Metabolic bone disease   . Multiple falls   . Osteoporosis    Severe  . Other abnormalities of gait and mobility   . Plaque psoriasis   . Sleep apnea    cpap machine-settings 17  . Thrombocytopenia ([place])   . Thyroid disease    HX GRAVES DISEASE  . Transfusion history    s/p 12'13 hip surgery  . Unsteady gait   . UTI (urinary tract infection)   . Vertebral compression fracture (HCC)    L1-wears brace    Past Surgical History:  Procedure Laterality Date  . APPENDECTOMY    . CARDIAC CATHETERIZATION  2013  . CATARACT EXTRACTION, BILATERAL    . COLONOSCOPY    . CYSTOSCOPY WITH RETROGRADE PYELOGRAM, URETEROSCOPY AND STENT PLACEMENT Left 11/07/2017   Procedure: CYSTOSCOPY WITH RETROGRADE PYELOGRAM, URETEROSCOPY AND STENT PLACEMENT;  Surgeon: Cleon Gustin, MD;  Location: [facility];  Service: Urology;  Laterality: Left;  .  CYSTOSCOPY/URETEROSCOPY/HOLMIUM LASER/STENT PLACEMENT Right 03/11/2017   Procedure: CYSTOSCOPY/URETEROSCOPYSTENT PLACEMENT right ureter retrograde pylegram;  Surgeon: Cleon Gustin, MD;  Location: [facility];  Service: Urology;  Laterality: Right;  . HARDWARE REMOVAL  10/05/2012   Procedure: HARDWARE REMOVAL;  Surgeon: [name], MD;  Location: [facility];  Service: Orthopedics;  Laterality: Right;  REMOVING  STRYKER  GAMMA NAIL  . HARDWARE REMOVAL Right 07/03/2013   Procedure: HARDWARE REMOVAL RIGHT TIBIA ;  Surgeon: Rozanna Box, MD;  Location: [place];  Service: Orthopedics;  Laterality: Right;  . HIP CLOSED REDUCTION Right 10/15/2013   Procedure: CLOSED MANIPULATION HIP;  Surgeon: [name], MD;  [facility];  Service: Orthopedics;  Laterality: Right;  . hip revision     June  2017  . HOLMIUM LASER APPLICATION Right 03/16/629   Procedure: HOLMIUM LASER APPLICATION;  Surgeon: Cleon Gustin, MD;  [facility];  Service: Urology;  Laterality: Right;  . INCISION AND DRAINAGE HIP Right 11/16/2013   Procedure: IRRIGATION AND DEBRIDEMENT RIGHT HIP;  Surgeon: [name], MD;  [facility];  Service: Orthopedics;  Laterality: Right;  . IR IMAGING GUIDED PORT INSERTION  06/22/2018  . IR URETERAL STENT LEFT NEW ACCESS W/O SEP NEPHROSTOMY CATH  10/24/2017  . JOINT REPLACEMENT     hip-right x2  . KNEE SURGERY Bilateral 2012   Total knee replacements  . NEPHROLITHOTOMY Right 02/08/2017   Procedure: NEPHROLITHOTOMY PERCUTANEOUS WITH SURGEON ACCESS;  Surgeon: Cleon Gustin, MD;  [facility];  Service: Urology;  Laterality: Right;  . NEPHROLITHOTOMY Left 10/24/2017   Procedure: NEPHROLITHOTOMY PERCUTANEOUS;  Surgeon: Cleon Gustin, MD;  [facility];  Service: Urology;  Laterality: Left;  . ORIF TIBIA FRACTURE Right 02/06/2013   Procedure: OPEN REDUCTION INTERNAL FIXATION (ORIF) TIBIA FRACTURE WITH IM ROD FIBULA;  Surgeon: Rozanna Box, MD;  Location: [place];  Service: Orthopedics;  Laterality: Right;  . ORIF TIBIA FRACTURE Right 07/03/2013   Procedure: RIGHT TIBIA NON UNION REPAIR ;  Surgeon: Rozanna Box, MD;  Location: [place];  Service: Orthopedics;  Laterality: Right;  . ORIF WRIST FRACTURE  10/02/2012   Procedure: OPEN REDUCTION INTERNAL FIXATION (ORIF) WRIST FRACTURE;  Surgeon: Roseanne Kaufman, MD;  [facility];  Service: Orthopedics;  Laterality: Right;  WITH   ANTIBIOTIC  CEMENT  . ORIF WRIST FRACTURE Left 10/28/2013   Procedure: OPEN REDUCTION INTERNAL FIXATION (ORIF) WRIST FRACTURE with allograft;  Surgeon: Roseanne Kaufman, MD;  [facility];  Service: Orthopedics;  Laterality: Left;  DVR Plate  . QUADRICEPS TENDON REPAIR Left 07/15/2017   Procedure: REPAIR QUADRICEP TENDON;  Surgeon: Frederik Pear, MD;  Location: [place];  Service:  Orthopedics;  Laterality: Left;  . right femur surgery  05/2012  . THYROIDECTOMY  02/1986  . TOTAL HIP REVISION  10/05/2012   Procedure: TOTAL HIP REVISION;  Surgeon: [name], MD;  [facility];  Service: Orthopedics;  Laterality: Right;  RIGHT TOTAL HIP REVISION  . TOTAL HIP REVISION Right 09/17/2013   Procedure: REVISION RIGHT TOTAL HIP ARTHROPLASTY ;  Surgeon: [name], MD;  [facility];  Service: Orthopedics;  Laterality: Right;  . TOTAL HIP REVISION Right 10/26/2013   Procedure: REVISION RIGHT TOTAL HIP ARTHROPLASTY;  Surgeon: [name], MD;  [facility];  Service: Orthopedics;  Laterality: Right;  . TOTAL HIP REVISION  03/2016  . TOTAL KNEE REVISION Left 04/11/2017   Procedure: TOTAL KNEE REVISION PATELLA and TIBIA;  Surgeon: Frederik Pear, MD;  Location: [place];  Service: Orthopedics;  Laterality: Left;  . WRIST FRACTURE SURGERY  05/2012    There were no vitals filed for this visit.  Subjective Assessment - 03/08/19 1356    Subjective  Pt states he is doing well.  He had a good week, doing the exercises every other day and is finding that he can do more with less pain.  He did not have a fall.  He had one episode of balance loss, but was able to correct himself and did not fall     Pertinent History  2013 fall resulting in a femur break on the Rt leg resulting in multiple surgeries due to failures including hip replacements another broken tibia leading to the discovery of osteoporosis and a metabolic bone disease.  After forteo injections x 2 years he is now in the osteopenia category.  Has a new scan coming up in a few weeks.  prior to the cancer diagnosis he was able to be confident with a cane.  Then was diagnosed with diffuse non-hogkin large cell B-lymphoma 07/09/18 located in tonsils and Rt groin. Treatment consists of an infusion every 3 weeks hopefully for a total of 5, A-fib controlled, HTN controlled with medication    Patient Stated Goals  to get balance  back and continue with strengthening.     Currently in Pain?  No/denies                       [facility] Adult PT Treatment/Exercise - 03/08/19 0001      High Level Balance   High Level Balance Comments  static standing with walker after doing hip hinge exercise for additional 6 minutes.       Exercises   Exercises  Knee/Hip;Other Exercises    Other Exercises   deep breathing with extra sip at the top an audible exhale for lung expansion x 5 reps.  4 count box breathing for one minute       Knee/Hip Exercises: Standing   Forward Lunges  Right;Left;10 reps    Forward Lunges Limitations  mini lunge with frequent cues for form and for knee control of left knee. Pt had dificulty with this exercisd     Other Standing Knee Exercises  mini squats x 10 reps with cues to reach back with hips.     Other Standing Knee Exercises  hip hinge x 10 Pt able to keep back straight              PT Education - 03/08/19 1404    Education Details  upgraded home exercise again to include standing LE exercise for lunges, squats and hip hinge     Person(s) Educated  Patient    Methods  Handout    Comprehension  Verbalized understanding;Returned demonstration       PT Short Term Goals - 03/08/19 1736      PT SHORT TERM GOAL #1   Title  Episodes of "buckling" will decrease to one time a day.     Status  Achieved      PT SHORT TERM GOAL #2   Title  Pt will report he is doing strengthening exercise daily.     Status  Achieved        PT Long Term Goals - 03/08/19 1736      PT LONG TERM GOAL #1   Title  Pt will improve sit to stand x 30sec from 21" surface  to 8 times     Baseline  09/28/2018  7 times in 30 sec. , 5 times on 12/04/2018     Status  On-going      PT LONG TERM GOAL #2   Title  Pt will improve Berg Balance Assessment  to at least 35    Baseline  29 on re-eval     Status  On-going      PT LONG TERM GOAL #3   Title  Pt will report he is able to go up and down the  steps to enter his home without supervision     Baseline  09/28/2018 pt continues to use a RW , he has had episode of fall without hands on walker; still with use of walker 100% of time-11/07/18     Status  Achieved      PT LONG TERM GOAL #4   Title  pt will be able to walk for 6 minutes without a rest break     Baseline  on 09/28/2018  18.5 sec , 12/03/2018  Pt reports he is walking 5-8 minutes at home in his neighborhood, some which is uphill     Status  Achieved      PT LONG TERM GOAL #5   Title  Pt will report no instances of Rt LE giving out x 5 days at home     Baseline  still has difficulty with both legs buckling, but he feels he can control it better     Status  On-going      PT LONG TERM GOAL #6   Title  Pt be able to perform one SLR on left leg to 8 inches with no terminal extension lag     Status  On-going            Plan - 03/08/19 1735    Clinical Impression Statement  Pt continues to improve. Upgraded home exercise today and added it to [place].  Gave him information about [employer] streaming exercise programs in preparation to DC from PT in 2 weeks     Clinical Impairments Affecting Rehab Potential  history of Rt hip fractures and functional impairment     PT Frequency  2x / week    PT Duration  8 weeks    PT Next Visit Plan  Review goals. check to see if pt has tried out his heel lifts. If so assess gait and continue with standing work especially with advanced balance work at the rail in his home.  Consider suing a ball or meltal bottlw to stretch arches of feet  Or, do supine, sidelying. prone work on bed, or, practice controlled falling and getting up from the floor.     PT Home Exercise Plan  [email or web address], Access Code: [email or web address] on 4/23/20202; [email or web address] upgraded to add shoulder resistance exercise 02/08/2019       Patient will benefit from skilled therapeutic intervention in order to improve the following deficits and impairments:  Abnormal gait, Decreased activity  tolerance, Decreased endurance, Decreased strength, Decreased balance, Difficulty walking, Cardiopulmonary status limiting activity, Impaired perceived functional ability, Obesity, Postural dysfunction  Visit Diagnosis: Other abnormalities of gait and mobility  History of falling  Muscle weakness (generalized)     Problem List Patient Active Problem List   Diagnosis Date Noted  . Generalized weakness 11/08/2018  . Well adult exam 10/02/2018  . Counseling regarding advance care planning and goals of care 07/09/2018  . Diffuse large B-cell lymphoma ([place]) 06/23/2018  . Port-A-Cath  in place 06/23/2018  . Hypogonadism in male 11/22/2017  . Nephrolithiasis 10/24/2017  . Recurrent dislocation of left patella 07/15/2017  . Recurrent subluxation of patella, left 07/08/2017  . S/P revision of total knee 04/11/2017  . Failed total knee, left, initial encounter ([place]) 04/09/2017  . Renal calculus, right 02/08/2017  . Protein-calorie malnutrition, severe ([place]) 12/08/2016  . Chronic obstructive pulmonary disease ([place]) 12/08/2016  . Staghorn renal calculus 11/05/2016  . Wound drainage right hip 11/15/2013  . Expected blood loss anemia 09/18/2013  . Hyponatremia 09/18/2013  . S/P right TH revision 09/17/2013  . Nonunion, fracture, Right tibia  07/03/2013  . UTI (urinary tract infection) 07/03/2013  . Pathological fracture due to osteoporosis with delayed healing 04/24/2013  . Osteoporosis with fracture 04/24/2013  . Rash and nonspecific skin eruption 03/08/2013  . Syncope 02/21/2013  . PAF (paroxysmal atrial fibrillation) ([place]) 02/21/2013  . Chronic anticoagulation 02/21/2013  . Fall 02/08/2013  . Physical deconditioning 02/08/2013  . Low back pain radiating to both legs 02/08/2013  . Compression fracture of L1 lumbar vertebra (HCC) 02/08/2013  . Right tibial fracture 02/05/2013  . Obesity, unspecified 02/05/2013  . Osteomyelitis of right wrist ([place]) 11/02/2012  . Anemia 10/06/2012   . HTN (hypertension) 10/03/2012  . Psoriasis 10/03/2012  . Dyslipidemia 10/03/2012  . Vitamin D deficiency 10/03/2012  . GERD (gastroesophageal reflux disease) 10/03/2012  . Hyperglycemia 10/03/2012  . Anxiety 10/03/2012  . Depression 10/03/2012  . CAD (coronary artery disease) 10/03/2012  . DJD (degenerative joint disease) 10/03/2012  . Chronic gingivitis 10/03/2012  . Low testosterone, possible hypogonadism 10/01/2012  . Normocytic anemia 09/30/2012  . Right femoral fracture ([place]) 09/29/2012  . Right forearm fracture 09/29/2012  . Atrial fibrillation ([place]) 09/29/2012  . Hypothyroidism 09/29/2012  . History of recurrent UTIs 09/29/2012   Donato Heinz. Owens Shark PT  Norwood Levo 03/08/2019, 5:39 PM  [place], [place], [address] Phone: [phone]   Fax:  [phone]  Name: [name] MRN: [id number] Date of Birth: Feb 26, 1946

## 2019-03-13 ENCOUNTER — Other Ambulatory Visit: Payer: Self-pay

## 2019-03-13 ENCOUNTER — Ambulatory Visit: Payer: Medicare Other | Attending: Hematology | Admitting: Physical Therapy

## 2019-03-13 ENCOUNTER — Encounter: Payer: Self-pay | Admitting: Physical Therapy

## 2019-03-13 DIAGNOSIS — R2689 Other abnormalities of gait and mobility: Secondary | ICD-10-CM | POA: Insufficient documentation

## 2019-03-13 DIAGNOSIS — M6281 Muscle weakness (generalized): Secondary | ICD-10-CM

## 2019-03-13 DIAGNOSIS — Z9181 History of falling: Secondary | ICD-10-CM | POA: Diagnosis present

## 2019-03-13 NOTE — Therapy (Addendum)
[place], [place], [address] Phone: [phone]   Fax:  [phone]  Physical Therapy Treatment Progress Note Reporting Period 12/20/2018 to 03/13/2019  See note below for Objective Data and Assessment of Progress/Goals.     Physical Therapy Telehealth Visit:  I connected with [name]  today at 10:30 by [facility] video conference and verified that I am speaking with the correct person using two identifiers.  I discussed the limitations, risks, security and privacy concerns of performing an evaluation and management service by Webex and the availability of in person appointments.  I also discussed with the patient that there may be a patient responsible charge related to this service. The patient expressed understanding and agreed to proceed.    The patient's address was confirmed.  Identified to the patient that therapist is a licensed [place] in the state of [place].  Verified phone # as [phone] to call in case of technical difficulties.    Patient Details  Name: [name] MRN: [id number] Date of Birth: 1946-02-26 Referring Provider (PT): Dr. Irene Limbo   Encounter Date: 03/13/2019  PT End of Session - 03/13/19 1137    Visit Number  30    Number of Visits  48    Date for PT Re-Evaluation  04/05/19    PT Start Time  1030    PT Stop Time  1115    PT Time Calculation (min)  45 min    Activity Tolerance  Patient tolerated treatment well    Behavior During Therapy  [facility] for tasks assessed/performed       Past Medical History:  Diagnosis Date  . A-fib (HCC)    hx.- sinus rhythm now  . Acid reflux   . AKI (acute kidney injury) (HCC)    mild  . Anxiety   . Arthritis   . Atrial dilatation, bilateral    Severly, enlargement  per ECHO 11/03/16  . BPH (benign prostatic hyperplasia)   . CAD (coronary artery disease)   . Cancer ([place])   . Depression   . Dislocation of internal right hip prosthesis,  sequela   . Dysrhythmia    hx a fib  . Elevated brain natriuretic peptide (BNP) level   . Environmental allergies   . Fracture of wrist 10/23/13   LEFT  . High cholesterol   . History of Graves' disease   . History of kidney stones    surg. removal 02/2017  . History of syncope   . HLD (hyperlipidemia)   . Hypertension   . Hypothyroidism    thyroid removed  . Left knee injury    cap dislocated  . Low testosterone, possible hypogonadism 10/01/2012  . Lumbago   . Metabolic bone disease   . Multiple falls   . Osteoporosis    Severe  . Other abnormalities of gait and mobility   . Plaque psoriasis   . Sleep apnea    cpap machine-settings 17  . Thrombocytopenia ([place])   . Thyroid disease    HX GRAVES DISEASE  . Transfusion history    s/p 12'13 hip surgery  . Unsteady gait   . UTI (urinary tract infection)   . Vertebral compression fracture (HCC)    L1-wears brace    Past Surgical History:  Procedure Laterality Date  . APPENDECTOMY    . CARDIAC CATHETERIZATION  2013  . CATARACT EXTRACTION, BILATERAL    . COLONOSCOPY    . CYSTOSCOPY WITH RETROGRADE PYELOGRAM, URETEROSCOPY AND STENT  PLACEMENT Left 11/07/2017   Procedure: CYSTOSCOPY WITH RETROGRADE PYELOGRAM, URETEROSCOPY AND STENT PLACEMENT;  Surgeon: Cleon Gustin, MD;  Location: [facility];  Service: Urology;  Laterality: Left;  . CYSTOSCOPY/URETEROSCOPY/HOLMIUM LASER/STENT PLACEMENT Right 03/11/2017   Procedure: CYSTOSCOPY/URETEROSCOPYSTENT PLACEMENT right ureter retrograde pylegram;  Surgeon: Cleon Gustin, MD;  [facility];  Service: Urology;  Laterality: Right;  . HARDWARE REMOVAL  10/05/2012   Procedure: HARDWARE REMOVAL;  Surgeon: [name], MD;  [facility];  Service: Orthopedics;  Laterality: Right;  REMOVING  STRYKER  GAMMA NAIL  . HARDWARE REMOVAL Right 07/03/2013   Procedure: HARDWARE REMOVAL RIGHT TIBIA ;  Surgeon: Rozanna Box, MD;  Location: [facility];  Service: Orthopedics;  Laterality:  Right;  . HIP CLOSED REDUCTION Right 10/15/2013   Procedure: CLOSED MANIPULATION HIP;  Surgeon: [name], MD;  [facility];  Service: Orthopedics;  Laterality: Right;  . hip revision     June 2017  . HOLMIUM LASER APPLICATION Right 11/13/4495   Procedure: HOLMIUM LASER APPLICATION;  Surgeon: Cleon Gustin, MD;  [facility];  Service: Urology;  Laterality: Right;  . INCISION AND DRAINAGE HIP Right 11/16/2013   Procedure: IRRIGATION AND DEBRIDEMENT RIGHT HIP;  Surgeon: [name], MD;  [facility];  Service: Orthopedics;  Laterality: Right;  . IR IMAGING GUIDED PORT INSERTION  06/22/2018  . IR URETERAL STENT LEFT NEW ACCESS W/O SEP NEPHROSTOMY CATH  10/24/2017  . JOINT REPLACEMENT     hip-right x2  . KNEE SURGERY Bilateral 2012   Total knee replacements  . NEPHROLITHOTOMY Right 02/08/2017   Procedure: NEPHROLITHOTOMY PERCUTANEOUS WITH SURGEON ACCESS;  Surgeon: Cleon Gustin, MD;  [facility];  Service: Urology;  Laterality: Right;  . NEPHROLITHOTOMY Left 10/24/2017   Procedure: NEPHROLITHOTOMY PERCUTANEOUS;  Surgeon: Cleon Gustin, MD;  [facility];  Service: Urology;  Laterality: Left;  . ORIF TIBIA FRACTURE Right 02/06/2013   Procedure: OPEN REDUCTION INTERNAL FIXATION (ORIF) TIBIA FRACTURE WITH IM ROD FIBULA;  Surgeon: Rozanna Box, MD;  Location: [facility];  Service: Orthopedics;  Laterality: Right;  . ORIF TIBIA FRACTURE Right 07/03/2013   Procedure: RIGHT TIBIA NON UNION REPAIR ;  Surgeon: Rozanna Box, MD;  Location: [place];  Service: Orthopedics;  Laterality: Right;  . ORIF WRIST FRACTURE  10/02/2012   Procedure: OPEN REDUCTION INTERNAL FIXATION (ORIF) WRIST FRACTURE;  Surgeon: Roseanne Kaufman, MD;  [facility];  Service: Orthopedics;  Laterality: Right;  WITH   ANTIBIOTIC  CEMENT  . ORIF WRIST FRACTURE Left 10/28/2013   Procedure: OPEN REDUCTION INTERNAL FIXATION (ORIF) WRIST FRACTURE with allograft;  Surgeon: Roseanne Kaufman, MD;  [facility];  Service: Orthopedics;  Laterality: Left;  DVR Plate  . QUADRICEPS TENDON REPAIR Left 07/15/2017   Procedure: REPAIR QUADRICEP TENDON;  Surgeon: Frederik Pear, MD;  [facility];  Service: Orthopedics;  Laterality: Left;  . right femur surgery  05/2012  . THYROIDECTOMY  02/1986  . TOTAL HIP REVISION  10/05/2012   Procedure: TOTAL HIP REVISION;  Surgeon: [name], MD;  [facility];  Service: Orthopedics;  Laterality: Right;  RIGHT TOTAL HIP REVISION  . TOTAL HIP REVISION Right 09/17/2013   Procedure: REVISION RIGHT TOTAL HIP ARTHROPLASTY ;  Surgeon: [name], MD;  [facility];  Service: Orthopedics;  Laterality: Right;  . TOTAL HIP REVISION Right 10/26/2013   Procedure: REVISION RIGHT TOTAL HIP ARTHROPLASTY;  Surgeon: [name], MD;  [facility];  Service: Orthopedics;  Laterality: Right;  . TOTAL HIP REVISION  03/2016  . TOTAL KNEE REVISION Left 04/11/2017   Procedure: TOTAL KNEE REVISION PATELLA and TIBIA;  Surgeon: Frederik Pear, MD;  Location: [facility];  Service: Orthopedics;  Laterality: Left;  . WRIST FRACTURE SURGERY  05/2012    There were no vitals filed for this visit.  Subjective Assessment - 03/13/19 1118    Subjective  Pt continues to do well.  He is doing daily exercise and is pleased with his new routines and positive changes he has made.  He now actively focuses on what he does every time he goes up and down the stairs instead of talking and has not had another fall. He still has episodes of left knee "buckling" 3 to 4 times a day.  He is able to articulate how he is adding more activity into his daily activities.     Patient is accompained by:  Family member   Husband [name] present and supportive druing telehealth visit    Pertinent History  2013 fall resulting in a femur break on the Rt leg resulting in multiple surgeries due to failures including hip replacements another broken tibia leading to the discovery of osteoporosis and a metabolic bone  disease.  After forteo injections x 2 years he is now in the osteopenia category.  Has a new scan coming up in a few weeks.  prior to the cancer diagnosis he was able to be confident with a cane.  Then was diagnosed with diffuse non-hogkin large cell B-lymphoma 07/09/18 located in tonsils and Rt groin. Treatment consists of an infusion every 3 weeks hopefully for a total of 5, A-fib controlled, HTN controlled with medication    Patient Stated Goals  to get balance back and continue with strengthening.     Currently in Pain?  No/denies         [facility] PT Assessment - 03/13/19 0001      Sit to Stand   Comments  8 reps in 30 sec from armless chair at home pushing up on sidelways walker, pt with obvious great effort                    [facility] Adult PT Treatment/Exercise - 03/13/19 0001      High Level Balance   High Level Balance Comments  static standing with walker for 6 1/2 minutes while doing weight shift and balance activites with no episode of balance loss or back pain       Lumbar Exercises: Seated   Other Seated Lumbar Exercises  sitting tall with core activated for stablity with 10 reps of chop/lift to each side.     Other Seated Lumbar Exercises  deep breathing "4 count box breathing" x 5 reps       Knee/Hip Exercises: Standing   Other Standing Knee Exercises  mini squats x 10 reps with cues to reach back with hips.     Other Standing Knee Exercises  hip hinge x 10 Pt able to keep back straight       Knee/Hip Exercises: Seated   Other Seated Knee/Hip Exercises  heel raises x 10 reps     Sit to Sand  --   8 reps in 30 sec pushing off on sideways stabalized walker              PT Short Term Goals - 03/13/19 1131      PT SHORT TERM GOAL #  1   Title  Episodes of "buckling" will decrease to one time a day.     Baseline  03/13/2019 Pt states he is having bulking 3-4 times a day mostly with left knee     Time  8    Period  Weeks    Status  On-going      PT SHORT  TERM GOAL #2   Title  Pt will report he is doing strengthening exercise daily.     Status  Achieved      PT SHORT TERM GOAL #3   Title  Pt will be able to complete or attempt to complete all tasks fo teh BERG balace assessment indicating a decrease in movment anxiety     Baseline  deferred due to diffulties to administer test remotely     Status  Deferred        PT Long Term Goals - 03/13/19 1133      PT LONG TERM GOAL #1   Title  Pt will improve sit to stand x 30sec from 21" surface to 8 times     Baseline  09/28/2018  7 times in 30 sec. , 5 times on 12/04/2018 , 8 times with difficulty from wicker chair with no armrests at home ( chair lower height and used sideway walker for support.  Pt with great difficutly     Time  8    Period  Weeks    Status  Achieved      PT LONG TERM GOAL #2   Title  Pt will improve Berg Balance Assessment  to at least 35    Baseline  29 on re-eval     Status  Deferred      PT LONG TERM GOAL #3   Title  Pt will report he is able to go up and down the steps to enter his home without supervision     Baseline  09/28/2018 pt continues to use a RW , he has had episode of fall without hands on walker; still with use of walker 100% of time-11/07/18     Status  Achieved      PT LONG TERM GOAL #4   Title  pt will be able to walk for 6 minutes without a rest break     Baseline  on 09/28/2018  18.5 sec , 12/03/2018  Pt reports he is walking 5-8 minutes at home in his neighborhood, some which is uphill     Status  Achieved      PT LONG TERM GOAL #5   Title  Pt will report no instances of Rt LE giving out x 5 days at home     Baseline  still has difficulty especially with left  leg buckling, but he feels he can control it better     Time  8    Period  Weeks    Status  On-going      PT LONG TERM GOAL #6   Title  Pt be able to perform one SLR on left leg to 8 inches with no terminal extension lag     Status  Achieved            Plan - 03/13/19 1137     Clinical Impression Statement  Tarig continues to improve in strength and endurance, but continues to have problems with knee buckling especailly on the left.  Focus today was on balance and strengthening with left knee control.  This problem could be exacerbated by leg  length discrepency with right leg shorter.  He is wearing his temporarty shoe lift on the right when he walks outside an dis considering getting a more permanent lift on on one of his shoes. Goals updated and 10 th visit note sent to Dr. Irene Limbo to inform him of [place] progress and plan to take a few month off from PT after next visit ( pt will have a medical procedure and then work on his exercise at home     Rehab Potential  Good    Clinical Impairments Affecting Rehab Potential  history of Rt hip fractures and functional impairment     PT Frequency  1x / week    PT Duration  8 weeks    PT Treatment/Interventions  ADLs/Self Care Home Management;Therapeutic exercise;Patient/family education;Gait training;Stair training;Balance training;Neuromuscular re-education    PT Next Visit Plan  assess pt posture and gait with shoe lift.  determine any more needs and make a plan for reassessment in September is all is ok     Consulted and Agree with Plan of Care  Patient    Family Member Consulted  [name]       Patient will benefit from skilled therapeutic intervention in order to improve the following deficits and impairments:  Abnormal gait, Decreased activity tolerance, Decreased endurance, Decreased strength, Decreased balance, Difficulty walking, Cardiopulmonary status limiting activity, Impaired perceived functional ability, Obesity, Postural dysfunction  Visit Diagnosis: Other abnormalities of gait and mobility  History of falling  Muscle weakness (generalized)     Problem List Patient Active Problem List   Diagnosis Date Noted  . Generalized weakness 11/08/2018  . Well adult exam 10/02/2018  . Counseling regarding advance  care planning and goals of care 07/09/2018  . Diffuse large B-cell lymphoma ([place]) 06/23/2018  . Port-A-Cath in place 06/23/2018  . Hypogonadism in male 11/22/2017  . Nephrolithiasis 10/24/2017  . Recurrent dislocation of left patella 07/15/2017  . Recurrent subluxation of patella, left 07/08/2017  . S/P revision of total knee 04/11/2017  . Failed total knee, left, initial encounter ([place]) 04/09/2017  . Renal calculus, right 02/08/2017  . Protein-calorie malnutrition, severe ([place]) 12/08/2016  . Chronic obstructive pulmonary disease ([place]) 12/08/2016  . Staghorn renal calculus 11/05/2016  . Wound drainage right hip 11/15/2013  . Expected blood loss anemia 09/18/2013  . Hyponatremia 09/18/2013  . S/P right TH revision 09/17/2013  . Nonunion, fracture, Right tibia  07/03/2013  . UTI (urinary tract infection) 07/03/2013  . Pathological fracture due to osteoporosis with delayed healing 04/24/2013  . Osteoporosis with fracture 04/24/2013  . Rash and nonspecific skin eruption 03/08/2013  . Syncope 02/21/2013  . PAF (paroxysmal atrial fibrillation) ([place]) 02/21/2013  . Chronic anticoagulation 02/21/2013  . Fall 02/08/2013  . Physical deconditioning 02/08/2013  . Low back pain radiating to both legs 02/08/2013  . Compression fracture of L1 lumbar vertebra (HCC) 02/08/2013  . Right tibial fracture 02/05/2013  . Obesity, unspecified 02/05/2013  . Osteomyelitis of right wrist ([place]) 11/02/2012  . Anemia 10/06/2012  . HTN (hypertension) 10/03/2012  . Psoriasis 10/03/2012  . Dyslipidemia 10/03/2012  . Vitamin D deficiency 10/03/2012  . GERD (gastroesophageal reflux disease) 10/03/2012  . Hyperglycemia 10/03/2012  . Anxiety 10/03/2012  . Depression 10/03/2012  . CAD (coronary artery disease) 10/03/2012  . DJD (degenerative joint disease) 10/03/2012  . Chronic gingivitis 10/03/2012  . Low testosterone, possible hypogonadism 10/01/2012  . Normocytic anemia 09/30/2012  . Right femoral fracture  ([place]) 09/29/2012  . Right forearm fracture 09/29/2012  .  Atrial fibrillation ([place]) 09/29/2012  . Hypothyroidism 09/29/2012  . History of recurrent UTIs 09/29/2012   Donato Heinz. Owens Shark PT  Norwood Levo 03/13/2019, 11:43 AM  [place], [place], [address] Phone: [phone]   Fax:  [phone]  Name: AMAURIE WANDEL MRN: [id number] Date of Birth: Jan 20, 1946  PHYSICAL THERAPY DISCHARGE SUMMARY  Visits from Start of Care: 30  Current functional level related to goals / functional outcomes: unknown   Remaining deficits: unknown   Education / Equipment: Home exercise  Plan: Patient agrees to discharge.  Patient goals were partially met. Patient is being discharged due to not returning since the last visit.  ?????    Donato Heinz. Owens Shark, PT

## 2019-03-16 NOTE — Progress Notes (Signed)
02/01/2019- noted in [place] visit with Dr. Irene Limbo, Oncology  11/22/2018- noted in [facility] PET Image Rest[facility]  11/08/2018- noted in Epic-CXR 2 view and EKG  08/27/2018- noted in Epic-office note with Dr. Skeet Latch  06/24/2018- noted in [place]

## 2019-03-16 NOTE — Progress Notes (Signed)
Called Selita, scheduler for Dr. Alyson Ingles requesting if she had a Cardiac clearance and to Fax it to [place] if she did have one

## 2019-03-16 NOTE — Patient Instructions (Addendum)
[name]  03/16/2019   Your procedure is scheduled on: Monday 03/26/2019  Report to [facility] Main  Entrance              Report to  RADIOLOGY   at   1000  AM               YOU NEED TO HAVE A COVID 19 TEST ON 03/22/19  @ 11:00 AM, THIS TEST MUST BE DONE BEFORE SURGERY, COME TO [facility] ENTRANCE. ONCE YOUR TEST IS COMPLETED, PLEASE BEGIN THE QUARANTINE INSTRUCTIONS AS PROVIDED IN YOUR HANDOUT.   Call this number if you have problems the morning of surgery [phone]               Please bring your CPAP mask and tubing with you to the hospital !   Remember: Do not eat food or drink liquids :After Midnight.               BRUSH YOUR TEETH MORNING OF SURGERY AND RINSE YOUR MOUTH OUT, NO CHEWING GUM CANDY OR MINTS.     Take these medicines the morning of surgery with A SIP OF WATER: Bupropion (Wellbutrin XL), Diltiazem (Cardiazem), Duloxetine (Cymbalta), Levothyroxine (Synthroid), Metoprolol tartrate (Lopressor), Omeprazole (Prilosec), use Albuterol nebulizer if needed                                You may not have any metal on your body including hair pins and              piercings     Do not wear jewelry, cologne, lotions, powders or perfumes, deodorant                      Men may shave face and neck.   Do not bring valuables to the hospital. [place].  Contacts, dentures or bridgework may not be worn into surgery. .                  Please read over the following fact sheets you were given: _____________________________________________________________________             [facility] - Preparing for Surgery Before surgery, you can play an important role.  Because skin is not sterile, your skin needs to be as free of germs as possible.  You can reduce the number of germs on your skin by washing with CHG (chlorahexidine gluconate) soap before surgery.  CHG is an  antiseptic cleaner which kills germs and bonds with the skin to continue killing germs even after washing. Please DO NOT use if you have an allergy to CHG or antibacterial soaps.  If your skin becomes reddened/irritated stop using the CHG and inform your nurse when you arrive at Short Stay. Do not shave (including legs and underarms) for at least 48 hours prior to the first CHG shower.  You may shave your face/neck. Please follow these instructions carefully:  1.  Shower with CHG Soap the night before surgery and the  morning of Surgery.  2.  If you choose to wash your hair, wash your hair first as usual with your  normal  shampoo.  3.  After you  shampoo, rinse your hair and body thoroughly to remove the  shampoo.                           4.  Use CHG as you would any other liquid soap.  You can apply chg directly  to the skin and wash                       Gently with a scrungie or clean washcloth.  5.  Apply the CHG Soap to your body ONLY FROM THE NECK DOWN.   Do not use on face/ open                           Wound or open sores. Avoid contact with eyes, ears mouth and genitals (private parts).                       Wash face,  Genitals (private parts) with your normal soap.             6.  Wash thoroughly, paying special attention to the area where your surgery  will be performed.  7.  Thoroughly rinse your body with warm water from the neck down.  8.  DO NOT shower/wash with your normal soap after using and rinsing off  the CHG Soap.                9.  Pat yourself dry with a clean towel.            10.  Wear clean pajamas.            11.  Place clean sheets on your bed the night of your first shower and do not  sleep with pets. Day of Surgery : Do not apply any lotions/deodorants the morning of surgery.  Please wear clean clothes to the hospital/surgery center.  FAILURE TO FOLLOW THESE INSTRUCTIONS MAY RESULT IN THE CANCELLATION OF YOUR SURGERY PATIENT  SIGNATURE_________________________________  NURSE SIGNATURE__________________________________  ________________________________________________________________________

## 2019-03-19 ENCOUNTER — Encounter (HOSPITAL_COMMUNITY): Payer: Self-pay

## 2019-03-19 ENCOUNTER — Encounter (HOSPITAL_COMMUNITY)
Admission: RE | Admit: 2019-03-19 | Discharge: 2019-03-19 | Disposition: A | Discharge status: Home / Self Care | Payer: Medicare Other | Source: Ambulatory Visit | Attending: Urology | Admitting: Urology

## 2019-03-19 ENCOUNTER — Other Ambulatory Visit: Payer: Self-pay

## 2019-03-19 DIAGNOSIS — Z01812 Encounter for preprocedural laboratory examination: Secondary | ICD-10-CM | POA: Insufficient documentation

## 2019-03-19 DIAGNOSIS — N200 Calculus of kidney: Secondary | ICD-10-CM | POA: Diagnosis not present

## 2019-03-19 DIAGNOSIS — Z1159 Encounter for screening for other viral diseases: Secondary | ICD-10-CM | POA: Insufficient documentation

## 2019-03-19 LAB — CBC
HCT: 38.8 % — ABNORMAL LOW (ref 39.0–52.0)
Hemoglobin: 11.9 g/dL — ABNORMAL LOW (ref 13.0–17.0)
MCH: 29.5 pg (ref 26.0–34.0)
MCHC: 30.7 g/dL (ref 30.0–36.0)
MCV: 96 fL (ref 80.0–100.0)
Platelets: 175 10*3/uL (ref 150–400)
RBC: 4.04 MIL/uL — ABNORMAL LOW (ref 4.22–5.81)
RDW: 15.6 % — ABNORMAL HIGH (ref 11.5–15.5)
WBC: 5.1 10*3/uL (ref 4.0–10.5)
nRBC: 0 % (ref 0.0–0.2)

## 2019-03-19 LAB — BASIC METABOLIC PANEL
Anion gap: 7 (ref 5–15)
BUN: 23 mg/dL (ref 8–23)
CO2: 29 mmol/L (ref 22–32)
Calcium: 9.3 mg/dL (ref 8.9–10.3)
Chloride: 102 mmol/L (ref 98–111)
Creatinine, Ser: 1.11 mg/dL (ref 0.61–1.24)
GFR calc Af Amer: >60 mL/min (ref >60)
GFR calc non Af Amer: >60 mL/min (ref >60)
Glucose, Bld: 101 mg/dL — ABNORMAL HIGH (ref 70–99)
Potassium: 3.9 mmol/L (ref 3.5–5.1)
Sodium: 138 mmol/L (ref 135–145)

## 2019-03-19 NOTE — Progress Notes (Signed)
01-25-19 Surgical Clearance from Dr. Tollie Eth on chart w/ Xarelto instructions.

## 2019-03-21 NOTE — Anesthesia Preprocedure Evaluation (Addendum)
Anesthesia Evaluation  Patient identified by MRN, date of birth, ID band Patient awake    Reviewed: Allergy & Precautions, NPO status , Patient's Chart, lab work & pertinent test results, reviewed documented beta blocker date and time   History of Anesthesia Complications Negative for: history of anesthetic complications  Airway Mallampati: II  TM Distance: >3 FB Neck ROM: Full    Dental  (+) Dental Advisory Given, Teeth Intact, Caps   Pulmonary sleep apnea and Continuous Positive Airway Pressure Ventilation , COPD,  COPD inhaler, former smoker,    breath sounds clear to auscultation       Cardiovascular hypertension, Pt. on medications and Pt. on home beta blockers + CAD and + Past MI  + dysrhythmias Atrial Fibrillation  Rhythm:Irregular Rate:Normal   Echo 06/14/18: - Left ventricle: Apical hypokinesis abnormal GLS -13.9. The cavitysize was normal. Wall thickness was normal. Systolic function wasnormal. The estimated ejection fraction was in the range of 50%to 55%. Left ventricular diastolic function parameters werenormal. - Mitral valve: There was mild regurgitation. - Left atrium: The atrium was moderately dilated. - Atrial septum: No defect or patent foramen ovale was identified.    Neuro/Psych PSYCHIATRIC DISORDERS Anxiety Depression negative neurological ROS     GI/Hepatic Neg liver ROS, GERD  Medicated and Controlled,  Endo/Other  Hypothyroidism  Obesity   Renal/GU  Kidney stones      Musculoskeletal  (+) Arthritis ,   Abdominal   Peds  Hematology negative hematology ROS (+)   Anesthesia Other Findings   Reproductive/Obstetrics                           Anesthesia Physical Anesthesia Plan  ASA: III  Anesthesia Plan: General   Post-op Pain Management:    Induction: Intravenous  PONV Risk Score and Plan: 2 and Treatment may vary due to age or medical condition,  Ondansetron and Dexamethasone  Airway Management Planned: Oral ETT  Additional Equipment: None  Intra-op Plan:   Post-operative Plan: Extubation in OR  Informed Consent: I have reviewed the patients History and Physical, chart, labs and discussed the procedure including the risks, benefits and alternatives for the proposed anesthesia with the patient or authorized representative who has indicated his/her understanding and acceptance.     Dental advisory given  Plan Discussed with: CRNA and Anesthesiologist  Anesthesia Plan Comments:       Anesthesia Quick Evaluation

## 2019-03-21 NOTE — Progress Notes (Signed)
Anesthesia Chart Review   Case:  [id number] Date/Time:  03/26/19 1230   Procedures:      NEPHROLITHOTOMY PERCUTANEOUS (Left ) - 2 HRS     HOLMIUM LASER APPLICATION (Left )   Anesthesia type:  General   Pre-op diagnosis:  LEFT RENAL CALCULUS   Location:  [place] [facility]   Surgeon:  Cleon Gustin, MD      DISCUSSION: 73 yo former smoker (quit 06/05/12) with h/o depression, anxiety, a-fib (on Xarelto), HTN, sleep apnea w/CPAP, Graves disease, CAD, HLD, thrombocytopenia, hypothyroidism s/p thyroidectomy, diffuse large B-cell lymphoma on chemotherapy, left renal calculus scheduled for above procedure 03/26/2019 with Dr. [name].    Pt last seen by PCP, Dr. Lew Dawes, 03/07/2019 via telemedicine, stable at this visit.  Clearance from Dr. Alain Marion on chart which states pt is ok to stop Xarelto 3 days prior to procedure, ok to remain on Aspirin per Dr. Alyson Ingles.    Last seen by cardiologist, Dr. Skeet Latch, 08/17/18.  Stable at this visit.   Anticipate pt can proceed with planned procedure barring acute status change.   VS: BP (!) 144/71 (BP Location: Left Arm)   Pulse 84   Resp 18   Ht 6\' 3"  (1.905 m)   Wt (!) 138.8 kg   SpO2 99%   BMI 38.26 kg/m   PROVIDERS: Plotnikov, Evie Lacks, MD is PCP   Sullivan Lone, MD is Medical Oncologist   Skeet Latch, MD is Cardiologist  LABS: Labs reviewed: Acceptable for surgery. (all labs ordered are listed, but only abnormal results are displayed)  Labs Reviewed  BASIC METABOLIC PANEL - Abnormal; Notable for the following components:      Result Value   Glucose, Bld 101 (*)    All other components within normal limits  CBC - Abnormal; Notable for the following components:   RBC 4.04 (*)    Hemoglobin 11.9 (*)    HCT 38.8 (*)    RDW 15.6 (*)    All other components within normal limits     IMAGES: Carotid Doppler 02/21/13 Summary: No significant extracranial carotid artery stenosis demonstrated.  Vertebrals are patent with antegrade flow.  EKG: 11/08/2018 Rate 83 bpm Atrial fibrillation Ventricular premature complex LAD, consider left anterior fascicular block Low voltage, precordial leads When compared with ECG of 12/11/2016 No significant change was found  CV: Echo 06/14/18 Study Conclusions  - Left ventricle: Apical hypokinesis abnormal GLS -13.9. The cavity   size was normal. Wall thickness was normal. Systolic function was   normal. The estimated ejection fraction was in the range of 50%   to 55%. Left ventricular diastolic function parameters were   normal. - Mitral valve: There was mild regurgitation. - Left atrium: The atrium was moderately dilated. - Atrial septum: No defect or patent foramen ovale was identified. Past Medical History:  Diagnosis Date  . A-fib (HCC)    hx.- sinus rhythm now  . Acid reflux   . AKI (acute kidney injury) (HCC)    mild  . Anxiety   . Arthritis   . Atrial dilatation, bilateral    Severly, enlargement  per ECHO 11/03/16  . BPH (benign prostatic hyperplasia)   . CAD (coronary artery disease)   . Cancer ([place])   . Depression   . Dislocation of internal right hip prosthesis, sequela   . Dysrhythmia    hx a fib  . Elevated brain natriuretic peptide (BNP) level   . Environmental allergies   .  Fracture of wrist 10/23/13   LEFT  . High cholesterol   . History of Graves' disease   . History of kidney stones    surg. removal 02/2017  . History of syncope   . HLD (hyperlipidemia)   . Hypertension   . Hypothyroidism    thyroid removed  . Left knee injury    cap dislocated  . Low testosterone, possible hypogonadism 10/01/2012  . Lumbago   . Metabolic bone disease   . Multiple falls   . Osteoporosis    Severe  . Other abnormalities of gait and mobility   . Plaque psoriasis   . Sleep apnea    cpap machine-settings 17  . Thrombocytopenia ([place])   . Thyroid disease    HX GRAVES DISEASE  . Transfusion history    s/p 12'13 hip  surgery  . Unsteady gait   . UTI (urinary tract infection)   . Vertebral compression fracture (HCC)    L1-wears brace    Past Surgical History:  Procedure Laterality Date  . APPENDECTOMY    . CARDIAC CATHETERIZATION  2013  . CATARACT EXTRACTION, BILATERAL    . COLONOSCOPY    . CYSTOSCOPY WITH RETROGRADE PYELOGRAM, URETEROSCOPY AND STENT PLACEMENT Left 11/07/2017   Procedure: CYSTOSCOPY WITH RETROGRADE PYELOGRAM, URETEROSCOPY AND STENT PLACEMENT;  Surgeon: Cleon Gustin, MD;  Location: [facility];  Service: Urology;  Laterality: Left;  . CYSTOSCOPY/URETEROSCOPY/HOLMIUM LASER/STENT PLACEMENT Right 03/11/2017   Procedure: CYSTOSCOPY/URETEROSCOPYSTENT PLACEMENT right ureter retrograde pylegram;  Surgeon: Cleon Gustin, MD;  Location: [facility];  Service: Urology;  Laterality: Right;  . HARDWARE REMOVAL  10/05/2012   Procedure: HARDWARE REMOVAL;  Surgeon: [name], MD;  Location: [facility];  Service: Orthopedics;  Laterality: Right;  REMOVING  STRYKER  GAMMA NAIL  . HARDWARE REMOVAL Right 07/03/2013   Procedure: HARDWARE REMOVAL RIGHT TIBIA ;  Surgeon: Rozanna Box, MD;  Location: [place];  Service: Orthopedics;  Laterality: Right;  . HIP CLOSED REDUCTION Right 10/15/2013   Procedure: CLOSED MANIPULATION HIP;  Surgeon: [name], MD;  [facility];  Service: Orthopedics;  Laterality: Right;  . hip revision     June 2017  . HOLMIUM LASER APPLICATION Right 3/0/8657   Procedure: HOLMIUM LASER APPLICATION;  Surgeon: Cleon Gustin, MD;  [facility];  Service: Urology;  Laterality: Right;  . INCISION AND DRAINAGE HIP Right 11/16/2013   Procedure: IRRIGATION AND DEBRIDEMENT RIGHT HIP;  Surgeon: [name], MD;  [facility];  Service: Orthopedics;  Laterality: Right;  . IR IMAGING GUIDED PORT INSERTION  06/22/2018  . IR URETERAL STENT LEFT NEW ACCESS W/O SEP NEPHROSTOMY CATH  10/24/2017  . JOINT REPLACEMENT     hip-right x2  . KNEE SURGERY Bilateral 2012   Total knee  replacements  . NEPHROLITHOTOMY Right 02/08/2017   Procedure: NEPHROLITHOTOMY PERCUTANEOUS WITH SURGEON ACCESS;  Surgeon: Cleon Gustin, MD;  [facility];  Service: Urology;  Laterality: Right;  . NEPHROLITHOTOMY Left 10/24/2017   Procedure: NEPHROLITHOTOMY PERCUTANEOUS;  Surgeon: Cleon Gustin, MD;  [facility];  Service: Urology;  Laterality: Left;  . ORIF TIBIA FRACTURE Right 02/06/2013   Procedure: OPEN REDUCTION INTERNAL FIXATION (ORIF) TIBIA FRACTURE WITH IM ROD FIBULA;  Surgeon: Rozanna Box, MD;  Location: [place];  Service: Orthopedics;  Laterality: Right;  . ORIF TIBIA FRACTURE Right 07/03/2013   Procedure: RIGHT TIBIA NON UNION REPAIR ;  Surgeon: Rozanna Box, MD;  Location: [place];  Service:  Orthopedics;  Laterality: Right;  . ORIF WRIST FRACTURE  10/02/2012   Procedure: OPEN REDUCTION INTERNAL FIXATION (ORIF) WRIST FRACTURE;  Surgeon: Roseanne Kaufman, MD;  [facility];  Service: Orthopedics;  Laterality: Right;  WITH   ANTIBIOTIC  CEMENT  . ORIF WRIST FRACTURE Left 10/28/2013   Procedure: OPEN REDUCTION INTERNAL FIXATION (ORIF) WRIST FRACTURE with allograft;  Surgeon: Roseanne Kaufman, MD;  [facility];  Service: Orthopedics;  Laterality: Left;  DVR Plate  . QUADRICEPS TENDON REPAIR Left 07/15/2017   Procedure: REPAIR QUADRICEP TENDON;  Surgeon: Frederik Pear, MD;  Location: [place];  Service: Orthopedics;  Laterality: Left;  . right femur surgery  05/2012  . THYROIDECTOMY  02/1986  . TOTAL HIP REVISION  10/05/2012   Procedure: TOTAL HIP REVISION;  Surgeon: [name], MD;  [facility];  Service: Orthopedics;  Laterality: Right;  RIGHT TOTAL HIP REVISION  . TOTAL HIP REVISION Right 09/17/2013   Procedure: REVISION RIGHT TOTAL HIP ARTHROPLASTY ;  Surgeon: [name], MD;  [facility];  Service: Orthopedics;  Laterality: Right;  . TOTAL HIP REVISION Right 10/26/2013   Procedure: REVISION RIGHT TOTAL HIP ARTHROPLASTY;  Surgeon: [name], MD;   [facility];  Service: Orthopedics;  Laterality: Right;  . TOTAL HIP REVISION  03/2016  . TOTAL KNEE REVISION Left 04/11/2017   Procedure: TOTAL KNEE REVISION PATELLA and TIBIA;  Surgeon: Frederik Pear, MD;  Location: [place];  Service: Orthopedics;  Laterality: Left;  . WRIST FRACTURE SURGERY  05/2012    MEDICATIONS: . acetaminophen (TYLENOL) 500 MG tablet  . albuterol (PROVENTIL) (2.5 MG/3ML) 0.083% nebulizer solution  . alfuzosin (UROXATRAL) 10 MG 24 hr tablet  . atorvastatin (LIPITOR) 80 MG tablet  . buPROPion (WELLBUTRIN XL) 300 MG 24 hr tablet  . Calcium Citrate-Vitamin D (CALCIUM CITRATE + D PO)  . Cholecalciferol (VITAMIN D-3 PO)  . clonazePAM (KLONOPIN) 1 MG tablet  . denosumab (PROLIA) 60 MG/ML SOSY injection  . diltiazem (CARDIZEM CD) 240 MG 24 hr capsule  . doxycycline (VIBRA-TABS) 100 MG tablet  . DULoxetine (CYMBALTA) 60 MG capsule  . EPINEPHrine (EPIPEN 2-PAK) 0.3 mg/0.3 mL IJ SOAJ injection  . ezetimibe (ZETIA) 10 MG tablet  . ferrous sulfate 325 (65 FE) MG tablet  . HYDROcodone-acetaminophen (NORCO) 7.5-325 MG tablet  . HYDROcodone-acetaminophen (NORCO) 7.5-325 MG tablet  . HYDROcodone-acetaminophen (NORCO) 7.5-325 MG tablet  . levothyroxine (SYNTHROID, LEVOTHROID) 200 MCG tablet  . levothyroxine (SYNTHROID, LEVOTHROID) 25 MCG tablet  . loratadine (CLARITIN) 10 MG tablet  . Magnesium 500 MG CAPS  . metoprolol tartrate (LOPRESSOR) 25 MG tablet  . mirabegron ER (MYRBETRIQ) 25 MG TB24 tablet  . Multiple Vitamin (MULTIVITAMIN WITH MINERALS) TABS tablet  . nitroGLYCERIN (NITROSTAT) 0.4 MG SL tablet  . omeprazole (PRILOSEC) 40 MG capsule  . ondansetron (ZOFRAN) 4 MG tablet  . OTEZLA 30 MG TABS  . Probiotic Product (PROBIOTIC PO)  . prochlorperazine (COMPAZINE) 10 MG tablet  . testosterone enanthate (DELATESTRYL) 200 MG/ML injection  . tiZANidine (ZANAFLEX) 4 MG tablet  . vitamin C (ASCORBIC ACID) 500 MG tablet  . XARELTO 10 MG TABS tablet   No current  facility-administered medications for this encounter.     Maia Plan [facility] [phone] 03/21/19 1:18 PM

## 2019-03-22 ENCOUNTER — Telehealth: Payer: Self-pay | Admitting: Physical Therapy

## 2019-03-22 ENCOUNTER — Other Ambulatory Visit: Payer: Self-pay

## 2019-03-22 ENCOUNTER — Ambulatory Visit: Payer: Medicare Other | Admitting: Physical Therapy

## 2019-03-22 ENCOUNTER — Other Ambulatory Visit (HOSPITAL_COMMUNITY)
Admission: RE | Admit: 2019-03-22 | Discharge: 2019-03-22 | Disposition: A | Discharge status: Home / Self Care | Payer: Medicare Other | Source: Ambulatory Visit | Attending: Urology | Admitting: Urology

## 2019-03-22 ENCOUNTER — Other Ambulatory Visit: Payer: Self-pay | Admitting: Radiology

## 2019-03-22 DIAGNOSIS — Z01812 Encounter for preprocedural laboratory examination: Secondary | ICD-10-CM | POA: Diagnosis not present

## 2019-03-22 NOTE — Telephone Encounter (Signed)
Tried to connect with Rayvon today, but he had internet problems so we spoke over the phone.  Today was to be his last visit before a procedure next week and then taking the summer off from PT.  He has had some difficulties this week ( dental work, husband not feeling well) but otherwise is doing his exercises.  He finds that his left knee is still buckling but he knows now how to catch himself from falling. We talked about continuing to do the exercises that will promote knee stability without locking out, but that the main concern is safety especially on the steps.  He finds he locks in to the steps to keep his knee stable. He will continue to wear the shoe lift in his crocs and consider getting a more permanent shoe lift.    Pt reminded to call if he has questions.  He will call to reschedule in September.  The phone number was emailed to him as he requested.  Maudry Diego, PT 03/22/2019@ 4:04 PM

## 2019-03-23 LAB — NOVEL CORONAVIRUS, NAA (HOSP ORDER, SEND-OUT TO REF LAB; TAT 18-24 HRS): SARS-CoV-2, NAA: NOT DETECTED

## 2019-03-23 NOTE — Progress Notes (Signed)
SPOKE W/  _patient     SCREENING SYMPTOMS OF COVID 19:   COUGH--no  RUNNY NOSE--- no  SORE THROAT---no  NASAL CONGESTION----no  SNEEZING----no  SHORTNESS OF BREATH---no  DIFFICULTY BREATHING---no  TEMP >100.0 -----no  UNEXPLAINED BODY ACHES------no  CHILLS --------no   HEADACHES ---------no  LOSS OF SMELL/ TASTE --------no    HAVE YOU OR ANY FAMILY MEMBER TRAVELLED PAST 14 DAYS OUT OF THE   COUNTY---no STATE----no COUNTRY----no  HAVE YOU OR ANY FAMILY MEMBER BEEN EXPOSED TO ANYONE WITH COVID 19? no    

## 2019-03-26 ENCOUNTER — Encounter (HOSPITAL_COMMUNITY): Payer: Self-pay | Admitting: *Deleted

## 2019-03-26 ENCOUNTER — Ambulatory Visit (HOSPITAL_COMMUNITY): Payer: Medicare Other

## 2019-03-26 ENCOUNTER — Other Ambulatory Visit: Payer: Self-pay

## 2019-03-26 ENCOUNTER — Ambulatory Visit (HOSPITAL_COMMUNITY): Payer: Medicare Other | Admitting: Anesthesiology

## 2019-03-26 ENCOUNTER — Encounter (HOSPITAL_COMMUNITY)
Admission: RE | Disposition: A | Discharge status: Home / Self Care | Payer: Self-pay | Source: Home / Self Care | Attending: Urology

## 2019-03-26 ENCOUNTER — Ambulatory Visit (HOSPITAL_COMMUNITY): Payer: Medicare Other | Admitting: Physician Assistant

## 2019-03-26 ENCOUNTER — Encounter (HOSPITAL_COMMUNITY): Payer: Self-pay

## 2019-03-26 ENCOUNTER — Observation Stay (HOSPITAL_COMMUNITY)
Admission: RE | Admit: 2019-03-26 | Discharge: 2019-03-29 | Disposition: A | Discharge status: Home / Self Care | Payer: Medicare Other | Attending: Urology | Admitting: Urology

## 2019-03-26 ENCOUNTER — Ambulatory Visit (HOSPITAL_COMMUNITY)
Admission: RE | Admit: 2019-03-26 | Discharge: 2019-03-26 | Disposition: A | Discharge status: Home / Self Care | Payer: Medicare Other | Source: Ambulatory Visit | Attending: Urology | Admitting: Urology

## 2019-03-26 DIAGNOSIS — M1990 Unspecified osteoarthritis, unspecified site: Secondary | ICD-10-CM | POA: Insufficient documentation

## 2019-03-26 DIAGNOSIS — I4891 Unspecified atrial fibrillation: Secondary | ICD-10-CM | POA: Diagnosis not present

## 2019-03-26 DIAGNOSIS — R2689 Other abnormalities of gait and mobility: Secondary | ICD-10-CM | POA: Diagnosis not present

## 2019-03-26 DIAGNOSIS — G4730 Sleep apnea, unspecified: Secondary | ICD-10-CM | POA: Diagnosis not present

## 2019-03-26 DIAGNOSIS — Z96653 Presence of artificial knee joint, bilateral: Secondary | ICD-10-CM | POA: Diagnosis not present

## 2019-03-26 DIAGNOSIS — N200 Calculus of kidney: Secondary | ICD-10-CM

## 2019-03-26 DIAGNOSIS — R2681 Unsteadiness on feet: Secondary | ICD-10-CM | POA: Diagnosis not present

## 2019-03-26 DIAGNOSIS — Z79890 Hormone replacement therapy (postmenopausal): Secondary | ICD-10-CM | POA: Diagnosis not present

## 2019-03-26 DIAGNOSIS — Z79899 Other long term (current) drug therapy: Secondary | ICD-10-CM | POA: Diagnosis not present

## 2019-03-26 DIAGNOSIS — N400 Enlarged prostate without lower urinary tract symptoms: Secondary | ICD-10-CM | POA: Diagnosis not present

## 2019-03-26 DIAGNOSIS — E039 Hypothyroidism, unspecified: Secondary | ICD-10-CM | POA: Insufficient documentation

## 2019-03-26 DIAGNOSIS — K219 Gastro-esophageal reflux disease without esophagitis: Secondary | ICD-10-CM | POA: Diagnosis not present

## 2019-03-26 DIAGNOSIS — M810 Age-related osteoporosis without current pathological fracture: Secondary | ICD-10-CM | POA: Diagnosis not present

## 2019-03-26 DIAGNOSIS — E669 Obesity, unspecified: Secondary | ICD-10-CM | POA: Insufficient documentation

## 2019-03-26 DIAGNOSIS — Z6838 Body mass index (BMI) 38.0-38.9, adult: Secondary | ICD-10-CM | POA: Insufficient documentation

## 2019-03-26 DIAGNOSIS — I2510 Atherosclerotic heart disease of native coronary artery without angina pectoris: Secondary | ICD-10-CM | POA: Diagnosis not present

## 2019-03-26 DIAGNOSIS — Z87891 Personal history of nicotine dependence: Secondary | ICD-10-CM | POA: Insufficient documentation

## 2019-03-26 DIAGNOSIS — I10 Essential (primary) hypertension: Secondary | ICD-10-CM | POA: Insufficient documentation

## 2019-03-26 DIAGNOSIS — Z87442 Personal history of urinary calculi: Secondary | ICD-10-CM

## 2019-03-26 DIAGNOSIS — J449 Chronic obstructive pulmonary disease, unspecified: Secondary | ICD-10-CM | POA: Insufficient documentation

## 2019-03-26 DIAGNOSIS — E785 Hyperlipidemia, unspecified: Secondary | ICD-10-CM | POA: Diagnosis not present

## 2019-03-26 DIAGNOSIS — Z9889 Other specified postprocedural states: Secondary | ICD-10-CM

## 2019-03-26 DIAGNOSIS — Z7901 Long term (current) use of anticoagulants: Secondary | ICD-10-CM | POA: Insufficient documentation

## 2019-03-26 DIAGNOSIS — N132 Hydronephrosis with renal and ureteral calculous obstruction: Secondary | ICD-10-CM

## 2019-03-26 DIAGNOSIS — N1330 Unspecified hydronephrosis: Secondary | ICD-10-CM

## 2019-03-26 HISTORY — PX: IR URETERAL STENT LEFT NEW ACCESS W/O SEP NEPHROSTOMY CATH: IMG6075

## 2019-03-26 HISTORY — PX: HOLMIUM LASER APPLICATION: SHX5852

## 2019-03-26 HISTORY — PX: NEPHROLITHOTOMY: SHX5134

## 2019-03-26 LAB — BASIC METABOLIC PANEL
Anion gap: 7 (ref 5–15)
BUN: 24 mg/dL — ABNORMAL HIGH (ref 8–23)
CO2: 24 mmol/L (ref 22–32)
Calcium: 9 mg/dL (ref 8.9–10.3)
Chloride: 109 mmol/L (ref 98–111)
Creatinine, Ser: 1.2 mg/dL (ref 0.61–1.24)
GFR calc Af Amer: >60 mL/min (ref >60)
GFR calc non Af Amer: 60 mL/min — ABNORMAL LOW (ref >60)
Glucose, Bld: 115 mg/dL — ABNORMAL HIGH (ref 70–99)
Potassium: 4.1 mmol/L (ref 3.5–5.1)
Sodium: 140 mmol/L (ref 135–145)

## 2019-03-26 LAB — CBC WITH DIFFERENTIAL/PLATELET
Abs Immature Granulocytes: 0.02 10*3/uL (ref 0.00–0.07)
Basophils Absolute: 0 10*3/uL (ref 0.0–0.1)
Basophils Relative: 1 %
Eosinophils Absolute: 0.3 10*3/uL (ref 0.0–0.5)
Eosinophils Relative: 5 %
HCT: 41.1 % (ref 39.0–52.0)
Hemoglobin: 13.2 g/dL (ref 13.0–17.0)
Immature Granulocytes: 0 %
Lymphocytes Relative: 28 %
Lymphs Abs: 1.5 10*3/uL (ref 0.7–4.0)
MCH: 30.6 pg (ref 26.0–34.0)
MCHC: 32.1 g/dL (ref 30.0–36.0)
MCV: 95.4 fL (ref 80.0–100.0)
Monocytes Absolute: 0.4 10*3/uL (ref 0.1–1.0)
Monocytes Relative: 8 %
Neutro Abs: 3.1 10*3/uL (ref 1.7–7.7)
Neutrophils Relative %: 58 %
Platelets: 209 10*3/uL (ref 150–400)
RBC: 4.31 MIL/uL (ref 4.22–5.81)
RDW: 15.9 % — ABNORMAL HIGH (ref 11.5–15.5)
WBC: 5.3 10*3/uL (ref 4.0–10.5)
nRBC: 0 % (ref 0.0–0.2)

## 2019-03-26 LAB — PROTIME-INR
INR: 1 (ref 0.8–1.2)
Prothrombin Time: 13.3 seconds (ref 11.4–15.2)

## 2019-03-26 SURGERY — NEPHROLITHOTOMY PERCUTANEOUS
Anesthesia: General | Laterality: Left

## 2019-03-26 MED ORDER — FENTANYL CITRATE (PF) 100 MCG/2ML IJ SOLN
INTRAMUSCULAR | Status: DC | PRN
  Administered 2019-03-26 (×4): 50 ug via INTRAVENOUS

## 2019-03-26 MED ORDER — LEVOTHYROXINE SODIUM 25 MCG PO TABS: 25.0000 ug | ORAL_TABLET | Freq: Every day | ORAL | Status: DC

## 2019-03-26 MED ORDER — DEXAMETHASONE SODIUM PHOSPHATE 10 MG/ML IJ SOLN
INTRAMUSCULAR | Status: AC
  Filled 2019-03-26: qty 1

## 2019-03-26 MED ORDER — OXYCODONE HCL 5 MG PO TABS
5.0000 mg | ORAL_TABLET | ORAL | Status: DC | PRN
  Administered 2019-03-26 – 2019-03-29 (×10): 5 mg via ORAL
  Filled 2019-03-26 (×10): qty 1

## 2019-03-26 MED ORDER — PROPOFOL 10 MG/ML IV BOLUS
INTRAVENOUS | Status: AC
  Filled 2019-03-26: qty 20

## 2019-03-26 MED ORDER — CIPROFLOXACIN IN D5W 400 MG/200ML IV SOLN
INTRAVENOUS | Status: AC
  Filled 2019-03-26: qty 200

## 2019-03-26 MED ORDER — ALBUTEROL SULFATE (2.5 MG/3ML) 0.083% IN NEBU: 2.5000 mg | INHALATION_SOLUTION | RESPIRATORY_TRACT | Status: DC | PRN

## 2019-03-26 MED ORDER — MIDAZOLAM HCL 2 MG/2ML IJ SOLN
INTRAMUSCULAR | Status: AC
  Filled 2019-03-26: qty 4

## 2019-03-26 MED ORDER — EZETIMIBE 10 MG PO TABS
10.0000 mg | ORAL_TABLET | Freq: Every day | ORAL | Status: DC
  Administered 2019-03-27 – 2019-03-29 (×3): 10 mg via ORAL
  Filled 2019-03-26 (×3): qty 1

## 2019-03-26 MED ORDER — LIDOCAINE 2% (20 MG/ML) 5 ML SYRINGE
INTRAMUSCULAR | Status: AC
  Filled 2019-03-26: qty 5

## 2019-03-26 MED ORDER — MIDAZOLAM HCL 2 MG/2ML IJ SOLN
INTRAMUSCULAR | Status: AC | PRN
  Administered 2019-03-26 (×3): 1 mg via INTRAVENOUS

## 2019-03-26 MED ORDER — ONDANSETRON HCL 4 MG/2ML IJ SOLN: 4.0000 mg | INTRAMUSCULAR | Status: DC | PRN

## 2019-03-26 MED ORDER — SODIUM CHLORIDE 0.9 % IV SOLN: 1.0000 g | Freq: Once | INTRAVENOUS | Status: DC

## 2019-03-26 MED ORDER — DIPHENHYDRAMINE HCL 50 MG/ML IJ SOLN: 12.5000 mg | Freq: Four times a day (QID) | INTRAMUSCULAR | Status: DC | PRN

## 2019-03-26 MED ORDER — FENTANYL CITRATE (PF) 100 MCG/2ML IJ SOLN
25.0000 ug | INTRAMUSCULAR | Status: DC | PRN
  Administered 2019-03-26 (×3): 50 ug via INTRAVENOUS

## 2019-03-26 MED ORDER — PANTOPRAZOLE SODIUM 40 MG PO TBEC
40.0000 mg | DELAYED_RELEASE_TABLET | Freq: Every day | ORAL | Status: DC
  Administered 2019-03-27 – 2019-03-29 (×3): 40 mg via ORAL
  Filled 2019-03-26 (×3): qty 1

## 2019-03-26 MED ORDER — BUPROPION HCL ER (XL) 150 MG PO TB24
300.0000 mg | ORAL_TABLET | Freq: Every day | ORAL | Status: DC
  Administered 2019-03-27 – 2019-03-29 (×3): 300 mg via ORAL
  Filled 2019-03-26 (×3): qty 2

## 2019-03-26 MED ORDER — ROCURONIUM BROMIDE 10 MG/ML (PF) SYRINGE
PREFILLED_SYRINGE | INTRAVENOUS | Status: DC | PRN
  Administered 2019-03-26: 10 mg via INTRAVENOUS
  Administered 2019-03-26: 60 mg via INTRAVENOUS
  Administered 2019-03-26 (×3): 10 mg via INTRAVENOUS
  Administered 2019-03-26: 20 mg via INTRAVENOUS
  Administered 2019-03-26 (×3): 10 mg via INTRAVENOUS

## 2019-03-26 MED ORDER — FENTANYL CITRATE (PF) 100 MCG/2ML IJ SOLN
INTRAMUSCULAR | Status: AC
  Filled 2019-03-26: qty 2

## 2019-03-26 MED ORDER — APREMILAST 30 MG PO TABS: 30.0000 mg | ORAL_TABLET | Freq: Two times a day (BID) | ORAL | Status: DC

## 2019-03-26 MED ORDER — CIPROFLOXACIN IN D5W 400 MG/200ML IV SOLN
400.0000 mg | Freq: Once | INTRAVENOUS | Status: AC
  Administered 2019-03-26: 400 mg via INTRAVENOUS

## 2019-03-26 MED ORDER — PROPOFOL 10 MG/ML IV BOLUS
INTRAVENOUS | Status: DC | PRN
  Administered 2019-03-26: 130 mg via INTRAVENOUS

## 2019-03-26 MED ORDER — ACETAMINOPHEN 325 MG PO TABS
650.0000 mg | ORAL_TABLET | ORAL | Status: DC | PRN
  Administered 2019-03-28: 650 mg via ORAL
  Filled 2019-03-26: qty 2

## 2019-03-26 MED ORDER — ZOLPIDEM TARTRATE 5 MG PO TABS
5.0000 mg | ORAL_TABLET | Freq: Every evening | ORAL | Status: DC | PRN
  Administered 2019-03-28: 5 mg via ORAL
  Filled 2019-03-26: qty 1

## 2019-03-26 MED ORDER — LEVOTHYROXINE SODIUM 25 MCG PO TABS
225.0000 ug | ORAL_TABLET | Freq: Every day | ORAL | Status: DC
  Administered 2019-03-27 – 2019-03-29 (×2): 225 ug via ORAL
  Filled 2019-03-26 (×2): qty 1

## 2019-03-26 MED ORDER — FENTANYL CITRATE (PF) 100 MCG/2ML IJ SOLN
INTRAMUSCULAR | Status: AC | PRN
  Administered 2019-03-26 (×2): 50 ug via INTRAVENOUS

## 2019-03-26 MED ORDER — LORATADINE 10 MG PO TABS
10.0000 mg | ORAL_TABLET | Freq: Every day | ORAL | Status: DC
  Administered 2019-03-27 – 2019-03-29 (×3): 10 mg via ORAL
  Filled 2019-03-26 (×3): qty 1

## 2019-03-26 MED ORDER — DEXAMETHASONE SODIUM PHOSPHATE 10 MG/ML IJ SOLN
INTRAMUSCULAR | Status: DC | PRN
  Administered 2019-03-26: 10 mg via INTRAVENOUS

## 2019-03-26 MED ORDER — SODIUM CHLORIDE 0.9 % IR SOLN
Status: DC | PRN
  Administered 2019-03-26: 6000 mL

## 2019-03-26 MED ORDER — PROCHLORPERAZINE MALEATE 10 MG PO TABS: 10.0000 mg | ORAL_TABLET | Freq: Four times a day (QID) | ORAL | Status: DC | PRN

## 2019-03-26 MED ORDER — SUGAMMADEX SODIUM 500 MG/5ML IV SOLN
INTRAVENOUS | Status: AC
  Filled 2019-03-26: qty 5

## 2019-03-26 MED ORDER — OXYCODONE HCL 5 MG PO TABS: 5.0000 mg | ORAL_TABLET | Freq: Once | ORAL | Status: DC | PRN

## 2019-03-26 MED ORDER — IOHEXOL 300 MG/ML  SOLN
INTRAMUSCULAR | Status: DC | PRN
  Administered 2019-03-26: 30 mL

## 2019-03-26 MED ORDER — MIRABEGRON ER 25 MG PO TB24
25.0000 mg | ORAL_TABLET | Freq: Every day | ORAL | Status: DC
  Administered 2019-03-26 – 2019-03-28 (×3): 25 mg via ORAL
  Filled 2019-03-26 (×3): qty 1

## 2019-03-26 MED ORDER — HYDROMORPHONE HCL 1 MG/ML IJ SOLN
0.5000 mg | INTRAMUSCULAR | Status: DC | PRN
  Administered 2019-03-26: 1 mg via INTRAVENOUS
  Filled 2019-03-26 (×2): qty 1

## 2019-03-26 MED ORDER — LACTATED RINGERS IV SOLN
INTRAVENOUS | Status: DC
  Administered 2019-03-26 (×2): via INTRAVENOUS

## 2019-03-26 MED ORDER — SUGAMMADEX SODIUM 200 MG/2ML IV SOLN
INTRAVENOUS | Status: DC | PRN
  Administered 2019-03-26: 500 mg via INTRAVENOUS

## 2019-03-26 MED ORDER — LIDOCAINE HCL 1 % IJ SOLN
INTRAMUSCULAR | Status: AC
  Filled 2019-03-26: qty 20

## 2019-03-26 MED ORDER — ONDANSETRON HCL 4 MG/2ML IJ SOLN: 4.0000 mg | Freq: Once | INTRAMUSCULAR | Status: DC | PRN

## 2019-03-26 MED ORDER — TIZANIDINE HCL 4 MG PO TABS
4.0000 mg | ORAL_TABLET | Freq: Four times a day (QID) | ORAL | Status: DC | PRN
  Administered 2019-03-26 – 2019-03-29 (×7): 4 mg via ORAL
  Filled 2019-03-26 (×7): qty 1

## 2019-03-26 MED ORDER — ONDANSETRON HCL 4 MG/2ML IJ SOLN
INTRAMUSCULAR | Status: DC | PRN
  Administered 2019-03-26: 4 mg via INTRAVENOUS

## 2019-03-26 MED ORDER — SODIUM CHLORIDE 0.9 % IV SOLN
INTRAVENOUS | Status: DC
  Administered 2019-03-26 – 2019-03-28 (×3): via INTRAVENOUS

## 2019-03-26 MED ORDER — METOPROLOL TARTRATE 25 MG PO TABS
25.0000 mg | ORAL_TABLET | Freq: Two times a day (BID) | ORAL | Status: DC
  Administered 2019-03-26 – 2019-03-29 (×6): 25 mg via ORAL
  Filled 2019-03-26 (×6): qty 1

## 2019-03-26 MED ORDER — SODIUM CHLORIDE 0.9 % IV SOLN
2.0000 g | INTRAVENOUS | Status: AC
  Administered 2019-03-26: 14:00:00 2 g via INTRAVENOUS
  Filled 2019-03-26: qty 2

## 2019-03-26 MED ORDER — DIPHENHYDRAMINE HCL 12.5 MG/5ML PO ELIX: 12.5000 mg | ORAL_SOLUTION | Freq: Four times a day (QID) | ORAL | Status: DC | PRN

## 2019-03-26 MED ORDER — ONDANSETRON HCL 4 MG/2ML IJ SOLN
INTRAMUSCULAR | Status: AC
  Filled 2019-03-26: qty 2

## 2019-03-26 MED ORDER — LIDOCAINE 2% (20 MG/ML) 5 ML SYRINGE
INTRAMUSCULAR | Status: DC | PRN
  Administered 2019-03-26: 60 mg via INTRAVENOUS

## 2019-03-26 MED ORDER — ROCURONIUM BROMIDE 10 MG/ML (PF) SYRINGE
PREFILLED_SYRINGE | INTRAVENOUS | Status: AC
  Filled 2019-03-26: qty 10

## 2019-03-26 MED ORDER — ALFUZOSIN HCL ER 10 MG PO TB24
10.0000 mg | ORAL_TABLET | Freq: Every day | ORAL | Status: DC
  Administered 2019-03-26 – 2019-03-28 (×3): 10 mg via ORAL
  Filled 2019-03-26 (×4): qty 1

## 2019-03-26 MED ORDER — 0.9 % SODIUM CHLORIDE (POUR BTL) OPTIME
TOPICAL | Status: DC | PRN
  Administered 2019-03-26: 1000 mL

## 2019-03-26 MED ORDER — IOHEXOL 300 MG/ML  SOLN
50.0000 mL | Freq: Once | INTRAMUSCULAR | Status: AC | PRN
  Administered 2019-03-26: 15 mL

## 2019-03-26 MED ORDER — LEVOTHYROXINE SODIUM 200 MCG PO TABS: 200.0000 ug | ORAL_TABLET | Freq: Every day | ORAL | Status: DC

## 2019-03-26 MED ORDER — DULOXETINE HCL 60 MG PO CPEP
60.0000 mg | ORAL_CAPSULE | Freq: Two times a day (BID) | ORAL | Status: DC
  Administered 2019-03-26 – 2019-03-29 (×6): 60 mg via ORAL
  Filled 2019-03-26 (×6): qty 1

## 2019-03-26 MED ORDER — ATORVASTATIN CALCIUM 40 MG PO TABS
80.0000 mg | ORAL_TABLET | Freq: Every day | ORAL | Status: DC
  Administered 2019-03-27 – 2019-03-29 (×3): 80 mg via ORAL
  Filled 2019-03-26 (×3): qty 2

## 2019-03-26 MED ORDER — CLONAZEPAM 1 MG PO TABS
1.0000 mg | ORAL_TABLET | Freq: Three times a day (TID) | ORAL | Status: DC | PRN
  Administered 2019-03-26 – 2019-03-29 (×8): 1 mg via ORAL
  Filled 2019-03-26 (×8): qty 1

## 2019-03-26 MED ORDER — OXYCODONE HCL 5 MG/5ML PO SOLN: 5.0000 mg | Freq: Once | ORAL | Status: DC | PRN

## 2019-03-26 MED ORDER — BELLADONNA ALKALOIDS-OPIUM 16.2-60 MG RE SUPP: 1.0000 | Freq: Four times a day (QID) | RECTAL | Status: DC | PRN

## 2019-03-26 MED ORDER — DILTIAZEM HCL ER COATED BEADS 240 MG PO CP24
240.0000 mg | ORAL_CAPSULE | Freq: Every day | ORAL | Status: DC
  Administered 2019-03-27 – 2019-03-29 (×3): 240 mg via ORAL
  Filled 2019-03-26 (×3): qty 1

## 2019-03-26 SURGICAL SUPPLY — 73 items
APL SKNCLS STERI-STRIP NONHPOA (GAUZE/BANDAGES/DRESSINGS) ×1
BAG URINE DRAINAGE (UROLOGICAL SUPPLIES) ×6 IMPLANT
BASKET STONE NITINOL 3FRX115MB (UROLOGICAL SUPPLIES) IMPLANT
BASKET ZERO TIP NITINOL 2.4FR (BASKET) IMPLANT
BENZOIN TINCTURE PRP APPL 2/3 (GAUZE/BANDAGES/DRESSINGS) ×4 IMPLANT
BLADE SURG 15 STRL LF DISP TIS (BLADE) ×1 IMPLANT
BLADE SURG 15 STRL SS (BLADE) ×3
BSKT STON RTRVL ZERO TP 2.4FR (BASKET)
CATH DILATION NEPHR BALL 15X10 (CATHETERS) ×2 IMPLANT
CATH FOLEY 2W COUNCIL 20FR 5CC (CATHETERS) IMPLANT
CATH FOLEY 2WAY SLVR  5CC 16FR (CATHETERS)
CATH FOLEY 2WAY SLVR  5CC 18FR (CATHETERS)
CATH FOLEY 2WAY SLVR 5CC 16FR (CATHETERS) ×1 IMPLANT
CATH FOLEY 2WAY SLVR 5CC 18FR (CATHETERS) ×1 IMPLANT
CATH IMAGER II 65CM (CATHETERS) ×3 IMPLANT
CATH INTERMIT  6FR 70CM (CATHETERS) ×1 IMPLANT
CATH ROBINSON RED A/P 20FR (CATHETERS) IMPLANT
CATH ULTRATHANE 14FR (CATHETERS) ×2 IMPLANT
CATH URET 5FR 28IN CONE TIP (BALLOONS) ×2
CATH URET 5FR 28IN OPEN ENDED (CATHETERS) ×2 IMPLANT
CATH URET 5FR 70CM CONE TIP (BALLOONS) IMPLANT
CATH URET DUAL LUMEN 6-10FR 50 (CATHETERS) ×2 IMPLANT
CATH URET WHISTLE 5FR 28IN (CATHETERS) ×2 IMPLANT
CATH X-FORCE N30 NEPHROSTOMY (TUBING) ×3 IMPLANT
COVER SURGICAL LIGHT HANDLE (MISCELLANEOUS) ×3 IMPLANT
COVER WAND RF STERILE (DRAPES) IMPLANT
DRAPE C-ARM 42X120 X-RAY (DRAPES) ×3 IMPLANT
DRAPE LINGEMAN PERC (DRAPES) ×3 IMPLANT
DRAPE SHEET LG 3/4 BI-LAMINATE (DRAPES) ×1 IMPLANT
DRAPE SURG IRRIG POUCH 19X23 (DRAPES) ×3 IMPLANT
DRSG PAD ABDOMINAL 8X10 ST (GAUZE/BANDAGES/DRESSINGS) ×6 IMPLANT
DRSG TEGADERM 4X4.75 (GAUZE/BANDAGES/DRESSINGS) ×2 IMPLANT
DRSG TEGADERM 8X12 (GAUZE/BANDAGES/DRESSINGS) ×6 IMPLANT
FIBER LASER FLEXIVA 1000 (UROLOGICAL SUPPLIES) IMPLANT
FIBER LASER FLEXIVA 365 (UROLOGICAL SUPPLIES) IMPLANT
FIBER LASER FLEXIVA 550 (UROLOGICAL SUPPLIES) IMPLANT
FIBER LASER TRAC TIP (UROLOGICAL SUPPLIES) IMPLANT
GAUZE SPONGE 4X4 12PLY STRL (GAUZE/BANDAGES/DRESSINGS) ×3 IMPLANT
GLOVE BIO SURGEON STRL SZ8 (GLOVE) ×5 IMPLANT
GLOVE BIOGEL PI IND STRL 8 (GLOVE) IMPLANT
GLOVE BIOGEL PI INDICATOR 8 (GLOVE) ×2
GOWN STRL REUS W/TWL LRG LVL3 (GOWN DISPOSABLE) ×2 IMPLANT
GOWN STRL REUS W/TWL XL LVL3 (GOWN DISPOSABLE) ×3 IMPLANT
GUIDEWIRE AMPLAZ .035X145 (WIRE) ×6 IMPLANT
GUIDEWIRE STR DUAL SENSOR (WIRE) ×8 IMPLANT
IV SET EXTENSION CATH 6 NF (IV SETS) ×1 IMPLANT
KIT BASIN OR (CUSTOM PROCEDURE TRAY) ×3 IMPLANT
KIT PROBE 340X3.4XDISP GRN (MISCELLANEOUS) IMPLANT
KIT PROBE TRILOGY 3.4X340 (MISCELLANEOUS)
KIT PROBE TRILOGY 3.9X350 (MISCELLANEOUS) IMPLANT
KIT TURNOVER KIT A (KITS) ×2 IMPLANT
MANIFOLD NEPTUNE II (INSTRUMENTS) ×3 IMPLANT
NDL TROCAR 18X15 ECHO (NEEDLE) IMPLANT
NDL TROCAR 18X20 (NEEDLE) IMPLANT
NEEDLE TROCAR 18X15 ECHO (NEEDLE) IMPLANT
NEEDLE TROCAR 18X20 (NEEDLE) IMPLANT
NS IRRIG 1000ML POUR BTL (IV SOLUTION) ×1 IMPLANT
PACK CYSTO (CUSTOM PROCEDURE TRAY) ×3 IMPLANT
SET AMPLATZ RENAL DILATOR (MISCELLANEOUS) ×2 IMPLANT
SET IRRIG Y TYPE TUR BLADDER L (SET/KITS/TRAYS/PACK) ×3 IMPLANT
SHEATH PEELAWAY SET 9 (SHEATH) ×4 IMPLANT
SPONGE LAP 4X18 RFD (DISPOSABLE) ×3 IMPLANT
STENT URET 6FRX26 CONTOUR (STENTS) IMPLANT
SUT SILK 2 0 30  PSL (SUTURE) ×2
SUT SILK 2 0 30 PSL (SUTURE) ×1 IMPLANT
SYR 10ML LL (SYRINGE) ×3 IMPLANT
SYR 20CC LL (SYRINGE) ×6 IMPLANT
SYR 50ML LL SCALE MARK (SYRINGE) ×3 IMPLANT
TOWEL OR 17X26 10 PK STRL BLUE (TOWEL DISPOSABLE) ×3 IMPLANT
TUBE CONNECTING VINYL 14FR 30C (TUBING) ×2 IMPLANT
TUBING CONNECTING 10 (TUBING) ×4 IMPLANT
TUBING CONNECTING 10' (TUBING) ×2
WATER STERILE IRR 1000ML POUR (IV SOLUTION) ×1 IMPLANT

## 2019-03-26 NOTE — Discharge Instructions (Signed)
Percutaneous Nephrostomy Home Guide  Percutaneous nephrostomy is a procedure to insert a flexible tube into your kidney so that urine can leave your body. This procedure may be done if a medical condition prevents urine from leaving your kidney in the usual way. During the procedure, the nephrostomy tube is inserted in the right or left side of your lower back and is connected to an external drainage bag.  After you have a nephrostomy tube placed, urine will collect in the drainage bag outside of your body. You will need to empty and change the drainage bag as needed. You will also need to take steps to care for the area where the nephrostomy tube was inserted (tube insertion site).  How do I care for my nephrostomy tube?   Always keep your tubing, the leg bag, or the bedside drainage bag below the level of your kidney so that your urine drains freely.   Avoid activities that would cause bending or pulling of your tubing. Ask your health care provider what activities are safe for you.   When connecting your nephrostomy tube to a drainage bag, make sure that there are no kinks in the tubing and that your urine is draining freely. You may want to gently wrap an elastic bandage over the tubing. This will help keep the tubing in place and prevent it from kinking. Make sure there is no tension on the tubing so it does not become dislodged.   At night, you may want to connect your nephrostomy tube or the leg bag to a larger bedside drainage bag.  How do I empty the drainage bag?  Empty the leg bag or bedside drainage bag whenever it becomes ? full. Also empty it before you go to sleep. Most drainage bags have a drain at the bottom that allows urine to be emptied. Follow these basic steps:  1. Hold the drainage bag over a toilet or collection container. Use a measuring container if your health care provider told you to measure your urine.  2. Open the drain of the bag and allow the urine to drain out.  3. After all the  urine has drained from the drainage bag, close the drain fully.  4. Flush the urine down the toilet. If a collection container was used, rinse the container.  How do I change the dressing around the nephrostomy tube?  Change your dressing and clean your tube exit site as told by your health care provider. You may need to change the dressing every day for the first 2 weeks after having a nephrostomy tube inserted. After the first 2 weeks, you may be told to change the dressing two times a week.  Supplies needed:   Mild soap and water.   Split gauze pads, 4  4 inches (10 x 10 cm).   Gauze pads, 4  4 inches (10 x 10 cm).   Paper tape.  How to change the dressing:  Because of the location of your nephrostomy tube, you may need help from another person to complete dressing changes. Follow these basic steps:  1. Wash hands with soap and water.  2. Gently remove the tape and dressing from around the nephrostomy tube. Be careful not to pull on the tube while removing the dressing. Avoid using scissors because they may damage the tube.  3. Wash the skin around the tube with mild soap and water, rinse well, and pat the skin dry with a clean cloth.  4. Check the skin   that it is still anchored in the skin. 6. Place two split gauze pads in and around the tube exit site. Do not apply ointments or alcohol to the site. 7. Place a gauze pad on top of the split gauze pad. 8. Coil the tube on top of the gauze. The tubing should rest on the gauze, not on the skin. 9. Place tape around each edge of the gauze pad. 10. Secure the nephrostomy tubing. Make sure that the tube does not kink or become pinched. The tubing should rest on the gauze pad, not on the skin. 11. Dispose of used supplies properly.  How do I flush my nephrostomy tube? Use a saline syringe to rinse out (flush) your  nephrostomy tube as told by your health care provider. Flushing is easier if a three-way stopcock is placed between the tube and the drainage bag. One connection of the stopcock connects to your tube, the second connects to the drainage bag, and the third is usually covered with a cap. The lever on the stopcock points to the direction on the stopcock that is closed to flow. Normally, the lever points in the direction of the cap to allow urine to drain from the tube to the drainage bag. Supplies needed:  Rubbing alcohol wipe.  10 mL 0.9% saline syringe. How to flush the tube: 1. Move the lever of the three-way stopcock so it points toward the drainage bag. 2. Clean the cap with a rubbing alcohol wipe. 3. Screw the tip of a 10 mL 0.9% saline syringe onto the cap. 4. Using the syringe plunger, slowly push the 10 mL 0.9% saline in the syringe over 5-10 seconds. If resistance is met or pain occurs while pushing, stop pushing the saline. 5. Remove the syringe from the cap. 6. Return the stopcock lever to the usual position, pointing in the direction of the cap. 7. Dispose of used supplies properly. How do I replace the drainage bag? Replace the drainage bag, three-way stopcock, and any extension tubing as told by your health care provider. Make sure you always have an extra drainage bag and connecting tubing available. 1. Empty urine from your drainage bag. 2. Gather a new drainage bag, three-way stopcock, and any extension tubing. 3. Remove the drainage bag, three-way stopcock, and any extension tubing from the nephrostomy tube. 4. Attach the new leg bag or bedside drainage bag, three-way stopcock, and any extension tubing to the nephrostomy tube. 5. Dispose of the used drainage bag, three-way stopcock, and any extension. Contact a health care provider if:  You have problems with any of the valves or tubing.  You have persistent pain or soreness in your back.  You have more redness, swelling,  or pain around your tube insertion site.  You have more fluid or blood coming from your tube insertion site.  Your tube insertion site feels warm to the touch.  You have pus or a bad smell coming from your tube insertion site.  You have increased urine output or you feel burning when urinating. Get help right away if:  You have pain in your abdomen during the first week.  You have chest pain or have trouble breathing.  You have a new appearance of blood in your urine.  You have a fever or chills.  You have back pain that is not relieved by your medicine.  You have decreased urine output.  Your nephrostomy tube comes out. This information is not intended to replace advice given to you by  your health care provider. Make sure you discuss any questions you have with your health care provider. °Document Released: 07/18/2013 Document Revised: 07/09/2016 Document Reviewed: 07/09/2016 °Elsevier Interactive Patient Education © 2019 [employer] ° ° ° °Moderate Conscious Sedation, Adult, Care After °These instructions provide you with information about caring for yourself after your procedure. Your health care provider may also give you more specific instructions. Your treatment has been planned according to current medical practices, but problems sometimes occur. Call your health care provider if you have any problems or questions after your procedure. °What can I expect after the procedure? °After your procedure, it is common: °· To feel sleepy for several hours. °· To feel clumsy and have poor balance for several hours. °· To have poor judgment for several hours. °· To vomit if you eat too soon. °Follow these instructions at home: °For at least 24 hours after the procedure: ° °· Do not: °? Participate in activities where you could fall or become injured. °? Drive. °? Use heavy machinery. °? Drink alcohol. °? Take sleeping pills or medicines that cause drowsiness. °? Make important decisions or sign  legal documents. °? Take care of children on your own. °· Rest. °Eating and drinking °· Follow the diet recommended by your health care provider. °· If you vomit: °? Drink water, juice, or soup when you can drink without vomiting. °? Make sure you have little or no nausea before eating solid foods. °General instructions °· Have a responsible adult stay with you until you are awake and alert. °· Take over-the-counter and prescription medicines only as told by your health care provider. °· If you smoke, do not smoke without supervision. °· Keep all follow-up visits as told by your health care provider. This is important. °Contact a health care provider if: °· You keep feeling nauseous or you keep vomiting. °· You feel light-headed. °· You develop a rash. °· You have a fever. °Get help right away if: °· You have trouble breathing. °This information is not intended to replace advice given to you by your health care provider. Make sure you discuss any questions you have with your health care provider. °Document Released: 07/18/2013 Document Revised: 03/01/2016 Document Reviewed: 01/17/2016 °Elsevier Interactive Patient Education © 2019 [employer] Inc[employer] ° ° °

## 2019-03-26 NOTE — Procedures (Signed)
Interventional Radiology Procedure Note  Procedure: Left renal access and ureteral catheter placement  Complications: None  Estimated Blood Loss: < 10 mL  Findings: Access performed of LP left renal calyx at level of calculus. 5 Fr catheter advanced beyond staghorn calculus in renal pelvis, down left ureter and into bladder. For PCNL in OR to follow.  Venetia Night. Kathlene Cote, M.D Pager:  [phone]

## 2019-03-26 NOTE — Anesthesia Postprocedure Evaluation (Signed)
Anesthesia Post Note  Patient: [name]  Procedure(s) Performed: NEPHROLITHOTOMY PERCUTANEOUS (Left ) HOLMIUM LASER APPLICATION (Left )     Patient location during evaluation: PACU Anesthesia Type: General Level of consciousness: awake and alert Pain management: pain level controlled Vital Signs Assessment: post-procedure vital signs reviewed and stable Respiratory status: spontaneous breathing, nonlabored ventilation and respiratory function stable Cardiovascular status: blood pressure returned to baseline and stable Postop Assessment: no apparent nausea or vomiting Anesthetic complications: no    Last Vitals:  Vitals:   03/26/19 1630 03/26/19 1645  BP: (!) 146/73 137/81  Pulse: 77 80  Resp: 11 12  Temp:    SpO2: 100% 94%    Last Pain:  Vitals:   03/26/19 1608  TempSrc:   PainSc: [place]

## 2019-03-26 NOTE — Transfer of Care (Signed)
Immediate Anesthesia Transfer of Care Note  Patient: [name]  Procedure(s) Performed: NEPHROLITHOTOMY PERCUTANEOUS (Left ) HOLMIUM LASER APPLICATION (Left )  Patient Location: PACU  Anesthesia Type:General  Level of Consciousness: awake  Airway & Oxygen Therapy: Patient Spontanous Breathing and Patient connected to face mask oxygen  Post-op Assessment: Report given to RN and Post -op Vital signs reviewed and stable  Post vital signs: Reviewed and stable  Last Vitals:  Vitals Value Taken Time  BP 139/85 03/26/19 1608  Temp    Pulse 88 03/26/19 1610  Resp 15 03/26/19 1610  SpO2 100 % 03/26/19 1610  Vitals shown include unvalidated device data.  Last Pain:  Vitals:   03/26/19 1015  TempSrc: Oral         Complications: No apparent anesthesia complications

## 2019-03-26 NOTE — Progress Notes (Signed)
Pt. has brought in own CPAP-Remstar along with hose and mask, currently on room air, made aware to notify if needed.

## 2019-03-26 NOTE — Brief Op Note (Signed)
03/26/2019  4:59 PM  PATIENT:  [name]  73 y.o. male  PRE-OPERATIVE DIAGNOSIS:  LEFT RENAL CALCULUS  POST-OPERATIVE DIAGNOSIS:  LEFT RENAL CALCULUS  PROCEDURE:  Procedure(s) with comments: NEPHROLITHOTOMY PERCUTANEOUS (Left) - 2 HRS HOLMIUM LASER APPLICATION (Left)  SURGEON:  Surgeon(s) and Role:    * Katianne Barre, Candee Furbish, MD - Primary  PHYSICIAN ASSISTANT:   ASSISTANTS: none   ANESTHESIA:   general  EBL:  100cc   BLOOD ADMINISTERED:none  DRAINS: Urinary Catheter (Foley)   LOCAL MEDICATIONS USED:  NONE  SPECIMEN:  Source of Specimen:  left renal calculus  DISPOSITION OF SPECIMEN:  PATHOLOGY  COUNTS:  YES  TOURNIQUET:  * No tourniquets in log *  DICTATION: .Note written in EPIC  PLAN OF CARE: Admit for overnight observation  PATIENT DISPOSITION:  PACU - hemodynamically stable.   Delay start of Pharmacological VTE agent (>24hrs) due to surgical blood loss or risk of bleeding: not applicable

## 2019-03-26 NOTE — H&P (Signed)
Urology Admission H&P  Chief Complaint: left renal calculus  History of Present Illness: Mr [name] is a 73yo with a hx of nephrolithiasis who developed recurrent proteus UTIs and was found on abdominal imaging to have a large left renal calculus. He denies any LUTS. No recent hematuria. No current flank apin  Past Medical History:  Diagnosis Date  . A-fib (HCC)    hx.- sinus rhythm now  . Acid reflux   . AKI (acute kidney injury) (HCC)    mild  . Anxiety   . Arthritis   . Atrial dilatation, bilateral    Severly, enlargement  per ECHO 11/03/16  . BPH (benign prostatic hyperplasia)   . CAD (coronary artery disease)   . Cancer ([place])   . Depression   . Dislocation of internal right hip prosthesis, sequela   . Dysrhythmia    hx a fib  . Elevated brain natriuretic peptide (BNP) level   . Environmental allergies   . Fracture of wrist 10/23/13   LEFT  . High cholesterol   . History of Graves' disease   . History of kidney stones    surg. removal 02/2017  . History of syncope   . HLD (hyperlipidemia)   . Hypertension   . Hypothyroidism    thyroid removed  . Left knee injury    cap dislocated  . Low testosterone, possible hypogonadism 10/01/2012  . Lumbago   . Metabolic bone disease   . Multiple falls   . Osteoporosis    Severe  . Other abnormalities of gait and mobility   . Plaque psoriasis   . Sleep apnea    cpap machine-settings 17  . Thrombocytopenia ([place])   . Thyroid disease    HX GRAVES DISEASE  . Transfusion history    s/p 12'13 hip surgery  . Unsteady gait   . UTI (urinary tract infection)   . Vertebral compression fracture (HCC)    L1-wears brace   Past Surgical History:  Procedure Laterality Date  . APPENDECTOMY    . CARDIAC CATHETERIZATION  2013  . CATARACT EXTRACTION, BILATERAL    . COLONOSCOPY    . CYSTOSCOPY WITH RETROGRADE PYELOGRAM, URETEROSCOPY AND STENT PLACEMENT Left 11/07/2017   Procedure: CYSTOSCOPY WITH RETROGRADE PYELOGRAM, URETEROSCOPY AND  STENT PLACEMENT;  Surgeon: Cleon Gustin, MD;  Location: [facility];  Service: Urology;  Laterality: Left;  . CYSTOSCOPY/URETEROSCOPY/HOLMIUM LASER/STENT PLACEMENT Right 03/11/2017   Procedure: CYSTOSCOPY/URETEROSCOPYSTENT PLACEMENT right ureter retrograde pylegram;  Surgeon: Cleon Gustin, MD;  Location: [facility];  Service: Urology;  Laterality: Right;  . HARDWARE REMOVAL  10/05/2012   Procedure: HARDWARE REMOVAL;  Surgeon: [name], MD;  Location: [facility];  Service: Orthopedics;  Laterality: Right;  REMOVING  STRYKER  GAMMA NAIL  . HARDWARE REMOVAL Right 07/03/2013   Procedure: HARDWARE REMOVAL RIGHT TIBIA ;  Surgeon: Rozanna Box, MD;  Location: [place];  Service: Orthopedics;  Laterality: Right;  . HIP CLOSED REDUCTION Right 10/15/2013   Procedure: CLOSED MANIPULATION HIP;  Surgeon: [name], MD;  Location: [facility];  Service: Orthopedics;  Laterality: Right;  . hip revision     June 2017  . HOLMIUM LASER APPLICATION Right 01/12/9674   Procedure: HOLMIUM LASER APPLICATION;  Surgeon: Cleon Gustin, MD;  Location: [facility];  Service: Urology;  Laterality: Right;  . INCISION AND DRAINAGE HIP Right 11/16/2013   Procedure: IRRIGATION AND DEBRIDEMENT RIGHT HIP;  Surgeon: [name], MD;  Location: [facility];  Service: Orthopedics;  Laterality:  Right;  . IR IMAGING GUIDED PORT INSERTION  06/22/2018  . IR URETERAL STENT LEFT NEW ACCESS W/O SEP NEPHROSTOMY CATH  10/24/2017  . JOINT REPLACEMENT     hip-right x2  . KNEE SURGERY Bilateral 2012   Total knee replacements  . NEPHROLITHOTOMY Right 02/08/2017   Procedure: NEPHROLITHOTOMY PERCUTANEOUS WITH SURGEON ACCESS;  Surgeon: Cleon Gustin, MD;  [facility];  Service: Urology;  Laterality: Right;  . NEPHROLITHOTOMY Left 10/24/2017   Procedure: NEPHROLITHOTOMY PERCUTANEOUS;  Surgeon: Cleon Gustin, MD;  [facility];  Service: Urology;  Laterality: Left;  . ORIF TIBIA FRACTURE Right 02/06/2013   Procedure: OPEN REDUCTION  INTERNAL FIXATION (ORIF) TIBIA FRACTURE WITH IM ROD FIBULA;  Surgeon: Rozanna Box, MD;  Location: [place];  Service: Orthopedics;  Laterality: Right;  . ORIF TIBIA FRACTURE Right 07/03/2013   Procedure: RIGHT TIBIA NON UNION REPAIR ;  Surgeon: Rozanna Box, MD;  Location: [place];  Service: Orthopedics;  Laterality: Right;  . ORIF WRIST FRACTURE  10/02/2012   Procedure: OPEN REDUCTION INTERNAL FIXATION (ORIF) WRIST FRACTURE;  Surgeon: Roseanne Kaufman, MD;  Location[facility];  Service: Orthopedics;  Laterality: Right;  WITH   ANTIBIOTIC  CEMENT  . ORIF WRIST FRACTURE Left 10/28/2013   Procedure: OPEN REDUCTION INTERNAL FIXATION (ORIF) WRIST FRACTURE with allograft;  Surgeon: Roseanne Kaufman, MD;  Location[facility];  Service: Orthopedics;  Laterality: Left;  DVR Plate  . QUADRICEPS TENDON REPAIR Left 07/15/2017   Procedure: REPAIR QUADRICEP TENDON;  Surgeon: Frederik Pear, MD;  Location: [place];  Service: Orthopedics;  Laterality: Left;  . right femur surgery  05/2012  . THYROIDECTOMY  02/1986  . TOTAL HIP REVISION  10/05/2012   Procedure: TOTAL HIP REVISION;  Surgeon: [name], MD;  Location[facility];  Service: Orthopedics;  Laterality: Right;  RIGHT TOTAL HIP REVISION  . TOTAL HIP REVISION Right 09/17/2013   Procedure: REVISION RIGHT TOTAL HIP ARTHROPLASTY ;  Surgeon: [name], MD;  Location[facility];  Service: Orthopedics;  Laterality: Right;  . TOTAL HIP REVISION Right 10/26/2013   Procedure: REVISION RIGHT TOTAL HIP ARTHROPLASTY;  Surgeon: [name], MD;  Location[facility];  Service: Orthopedics;  Laterality: Right;  . TOTAL HIP REVISION  03/2016  . TOTAL KNEE REVISION Left 04/11/2017   Procedure: TOTAL KNEE REVISION PATELLA and TIBIA;  Surgeon: Frederik Pear, MD;  Location: [place];  Service: Orthopedics;  Laterality: Left;  . WRIST FRACTURE SURGERY  05/2012    Home Medications:  No current facility-administered medications for this encounter.    Facility-Administered Medications Ordered  in Other Encounters  Medication Dose Route Frequency Provider Last Rate Last Dose  . cefTRIAXone (ROCEPHIN) 2 g in sodium chloride 0.9 % 100 mL IVPB  2 g Intravenous 30 min Pre-Op Advay Volante, Candee Furbish, MD      . ciprofloxacin (CIPRO) 400 MG/200ML IVPB           . ciprofloxacin (CIPRO) IVPB 400 mg  400 mg Intravenous Once Ardis Rowan, PA-C 200 mL/hr at 03/26/19 1205 400 mg at 03/26/19 1205  . fentaNYL (SUBLIMAZE) 100 MCG/2ML injection           . lactated ringers infusion   Intravenous Continuous Allred, Darrell K, PA-C 75 mL/hr at 03/26/19 1035    . lidocaine (XYLOCAINE) 1 % (with pres) injection           . midazolam (VERSED) 2 MG/2ML injection  Allergies:  Allergies  Allergen Reactions  . Short Ragweed Pollen Ext Cough    Family History  Problem Relation Age of Onset  . CAD Father 81  . Asthma Father   . Alcohol abuse Father   . Arthritis Mother   . Alcohol abuse Sister    Social History:  reports that he quit smoking about 6 years ago. He has never used smokeless tobacco. He reports current alcohol use. He reports that he does not use drugs.  Review of Systems  All other systems reviewed and are negative.   Physical Exam:  Vital signs in last 24 hours: Temp:  [98.5 F (36.9 C)] 98.5 F (36.9 C) (06/15 1015) Pulse Rate:  [77-95] 77 (06/15 1235) Resp:  [18-24] 24 (06/15 1235) BP: (132-163)/(75-113) 149/113 (06/15 1235) SpO2:  [98 %-99 %] 98 % (06/15 1235) Weight:  [136.1 kg] 136.1 kg (06/15 1102) Physical Exam  Constitutional: He is oriented to person, place, and time. He appears well-developed and well-nourished.  HENT:  Head: Normocephalic and atraumatic.  Eyes: Pupils are equal, round, and reactive to light. EOM are normal.  Neck: Normal range of motion. No thyromegaly present.  Cardiovascular: Normal rate and regular rhythm.  Respiratory: Effort normal. No respiratory distress.  GI: Soft. He exhibits no distension.  Musculoskeletal: Normal range  of motion.        General: No edema.  Neurological: He is alert and oriented to person, place, and time.  Skin: Skin is warm and dry.  Psychiatric: He has a normal mood and affect. His behavior is normal. Judgment and thought content normal.    Laboratory Data:  Results for orders placed or performed during the hospital encounter of 03/26/19 (from the past 24 hour(s))  Basic metabolic panel     Status: Abnormal   Collection Time: 03/26/19 10:09 AM  Result Value Ref Range   Sodium 140 135 - 145 mmol/L   Potassium 4.1 3.5 - 5.1 mmol/L   Chloride 109 98 - 111 mmol/L   CO2 24 22 - 32 mmol/L   Glucose, Bld 115 (H) 70 - 99 mg/dL   BUN 24 (H) 8 - 23 mg/dL   Creatinine, Ser 1.20 0.61 - 1.24 mg/dL   Calcium 9.0 8.9 - 10.3 mg/dL   GFR calc non Af Amer 60 (L) >60 mL/min   GFR calc Af Amer >60 >60 mL/min   Anion gap 7 5 - 15  CBC with Differential/Platelet     Status: Abnormal   Collection Time: 03/26/19 10:09 AM  Result Value Ref Range   WBC 5.3 4.0 - 10.5 K/uL   RBC 4.31 4.22 - 5.81 MIL/uL   Hemoglobin 13.2 13.0 - 17.0 g/dL   HCT 41.1 39.0 - 52.0 %   MCV 95.4 80.0 - 100.0 fL   MCH 30.6 26.0 - 34.0 pg   MCHC 32.1 30.0 - 36.0 g/dL   RDW 15.9 (H) 11.5 - 15.5 %   Platelets 209 150 - 400 K/uL   nRBC 0.0 0.0 - 0.2 %   Neutrophils Relative % 58 %   Neutro Abs 3.1 1.7 - 7.7 K/uL   Lymphocytes Relative 28 %   Lymphs Abs 1.5 0.7 - 4.0 K/uL   Monocytes Relative 8 %   Monocytes Absolute 0.4 0.1 - 1.0 K/uL   Eosinophils Relative 5 %   Eosinophils Absolute 0.3 0.0 - 0.5 K/uL   Basophils Relative 1 %   Basophils Absolute 0.0 0.0 - 0.1 K/uL   Immature Granulocytes 0 %  Abs Immature Granulocytes 0.02 0.00 - 0.07 K/uL  Protime-INR     Status: None   Collection Time: 03/26/19 10:09 AM  Result Value Ref Range   Prothrombin Time 13.3 11.4 - 15.2 seconds   INR 1.0 0.8 - 1.2   Recent Results (from the past 240 hour(s))  Novel Coronavirus, NAA (hospital order; send-out to ref lab)     Status:  None   Collection Time: 03/22/19 10:59 AM   Specimen: Nasopharyngeal Swab; Respiratory  Result Value Ref Range Status   SARS-CoV-2, NAA NOT DETECTED NOT DETECTED Final    Comment: (NOTE) This test was developed and its performance characteristics determined by [employer]. This test has not been FDA cleared or approved. This test has been authorized by FDA under an Emergency Use Authorization (EUA). This test is only authorized for the duration of time the declaration that circumstances exist justifying the authorization of the emergency use of in vitro diagnostic tests for detection of SARS-CoV-2 virus and/or diagnosis of COVID-19 infection under section 564(b)(1) of the Act, 21 U.S.C. 330QTM-2(U)(6), unless the authorization is terminated or revoked sooner. When diagnostic testing is negative, the possibility of a false negative result should be considered in the context of a patient's recent exposures and the presence of clinical signs and symptoms consistent with COVID-19. An individual without symptoms of COVID-19 and who is not shedding SARS-CoV-2 virus would expect to have a negative (not detected) result in this assay. Performed  At: [facility] [address] [place], [place] [id number] Rush Farmer MD [facility]:[id number]    [place]  Final    Comment: Performed at [facility], [place] [address], [place], [place] [address]   Creatinine: Recent Labs    03/26/19 1009  CREATININE 1.20   Baseline Creatinine: 1.2  Impression/Assessment:  72yo with left staghorn renal calculus  Plan:  The risks/benefits/alternatives to Left PCNL was explained to the patient and he understands and wishes to proceed with surgery  [name] 03/26/2019, 12:49 PM

## 2019-03-26 NOTE — H&P (Signed)
Chief Complaint: Left renal calculi  Referring Physician(s): Marion L  Supervising Physician: Aletta Edouard  Patient Status: [facility] - Out-pt  History of Present Illness: [name] is a 73 y.o. male with history of renal calculi.  He tells me this is his "third surgery for kidney stones".  He is here today for placement of a PCN and then he will go to the OR and urology will perform nephrolithotomy.  He is known to our service. Most recently he had a Port A Cath by Dr. Earleen Newport to treat B-cell lymphoma.  He is NPO. He takes Xarelto for Afib and he has held this medication.   Past Medical History:  Diagnosis Date  . A-fib (HCC)    hx.- sinus rhythm now  . Acid reflux   . AKI (acute kidney injury) (HCC)    mild  . Anxiety   . Arthritis   . Atrial dilatation, bilateral    Severly, enlargement  per ECHO 11/03/16  . BPH (benign prostatic hyperplasia)   . CAD (coronary artery disease)   . Cancer ([place])   . Depression   . Dislocation of internal right hip prosthesis, sequela   . Dysrhythmia    hx a fib  . Elevated brain natriuretic peptide (BNP) level   . Environmental allergies   . Fracture of wrist 10/23/13   LEFT  . High cholesterol   . History of Graves' disease   . History of kidney stones    surg. removal 02/2017  . History of syncope   . HLD (hyperlipidemia)   . Hypertension   . Hypothyroidism    thyroid removed  . Left knee injury    cap dislocated  . Low testosterone, possible hypogonadism 10/01/2012  . Lumbago   . Metabolic bone disease   . Multiple falls   . Osteoporosis    Severe  . Other abnormalities of gait and mobility   . Plaque psoriasis   . Sleep apnea    cpap machine-settings 17  . Thrombocytopenia ([place])   . Thyroid disease    HX GRAVES DISEASE  . Transfusion history    s/p 12'13 hip surgery  . Unsteady gait   . UTI (urinary tract infection)   . Vertebral compression fracture (HCC)    L1-wears brace    Past  Surgical History:  Procedure Laterality Date  . APPENDECTOMY    . CARDIAC CATHETERIZATION  2013  . CATARACT EXTRACTION, BILATERAL    . COLONOSCOPY    . CYSTOSCOPY WITH RETROGRADE PYELOGRAM, URETEROSCOPY AND STENT PLACEMENT Left 11/07/2017   Procedure: CYSTOSCOPY WITH RETROGRADE PYELOGRAM, URETEROSCOPY AND STENT PLACEMENT;  Surgeon: Cleon Gustin, MD;  Location: [facility];  Service: Urology;  Laterality: Left;  . CYSTOSCOPY/URETEROSCOPY/HOLMIUM LASER/STENT PLACEMENT Right 03/11/2017   Procedure: CYSTOSCOPY/URETEROSCOPYSTENT PLACEMENT right ureter retrograde pylegram;  Surgeon: Cleon Gustin, MD;  Location: [facility];  Service: Urology;  Laterality: Right;  . HARDWARE REMOVAL  10/05/2012   Procedure: HARDWARE REMOVAL;  Surgeon: [name], MD;  Location: [facility];  Service: Orthopedics;  Laterality: Right;  REMOVING  STRYKER  GAMMA NAIL  . HARDWARE REMOVAL Right 07/03/2013   Procedure: HARDWARE REMOVAL RIGHT TIBIA ;  Surgeon: Rozanna Box, MD;  Location: [place];  Service: Orthopedics;  Laterality: Right;  . HIP CLOSED REDUCTION Right 10/15/2013   Procedure: CLOSED MANIPULATION HIP;  Surgeon: [name], MD;  Location: [facility];  Service: Orthopedics;  Laterality: Right;  . hip revision  June 2017  . HOLMIUM LASER APPLICATION Right 04/13/1286   Procedure: HOLMIUM LASER APPLICATION;  Surgeon: Cleon Gustin, MD;  Location: [facility];  Service: Urology;  Laterality: Right;  . INCISION AND DRAINAGE HIP Right 11/16/2013   Procedure: IRRIGATION AND DEBRIDEMENT RIGHT HIP;  Surgeon: [name], MD;  Location: [facility];  Service: Orthopedics;  Laterality: Right;  . IR IMAGING GUIDED PORT INSERTION  06/22/2018  . IR URETERAL STENT LEFT NEW ACCESS W/O SEP NEPHROSTOMY CATH  10/24/2017  . JOINT REPLACEMENT     hip-right x2  . KNEE SURGERY Bilateral 2012   Total knee replacements  . NEPHROLITHOTOMY Right 02/08/2017   Procedure: NEPHROLITHOTOMY PERCUTANEOUS WITH SURGEON ACCESS;  Surgeon: Cleon Gustin, MD;  [facility];  Service: Urology;  Laterality: Right;  . NEPHROLITHOTOMY Left 10/24/2017   Procedure: NEPHROLITHOTOMY PERCUTANEOUS;  Surgeon: Cleon Gustin, MD;  [facility];  Service: Urology;  Laterality: Left;  . ORIF TIBIA FRACTURE Right 02/06/2013   Procedure: OPEN REDUCTION INTERNAL FIXATION (ORIF) TIBIA FRACTURE WITH IM ROD FIBULA;  Surgeon: Rozanna Box, MD;  Location: [place];  Service: Orthopedics;  Laterality: Right;  . ORIF TIBIA FRACTURE Right 07/03/2013   Procedure: RIGHT TIBIA NON UNION REPAIR ;  Surgeon: Rozanna Box, MD;  Location: [place];  Service: Orthopedics;  Laterality: Right;  . ORIF WRIST FRACTURE  10/02/2012   Procedure: OPEN REDUCTION INTERNAL FIXATION (ORIF) WRIST FRACTURE;  Surgeon: Roseanne Kaufman, MD;  [facility];  Service: Orthopedics;  Laterality: Right;  WITH   ANTIBIOTIC  CEMENT  . ORIF WRIST FRACTURE Left 10/28/2013   Procedure: OPEN REDUCTION INTERNAL FIXATION (ORIF) WRIST FRACTURE with allograft;  Surgeon: Roseanne Kaufman, MD;  [facility];  Service: Orthopedics;  Laterality: Left;  DVR Plate  . QUADRICEPS TENDON REPAIR Left 07/15/2017   Procedure: REPAIR QUADRICEP TENDON;  Surgeon: Frederik Pear, MD;  Location: [place];  Service: Orthopedics;  Laterality: Left;  . right femur surgery  05/2012  . THYROIDECTOMY  02/1986  . TOTAL HIP REVISION  10/05/2012   Procedure: TOTAL HIP REVISION;  Surgeon: [name], MD;  [facility];  Service: Orthopedics;  Laterality: Right;  RIGHT TOTAL HIP REVISION  . TOTAL HIP REVISION Right 09/17/2013   Procedure: REVISION RIGHT TOTAL HIP ARTHROPLASTY ;  Surgeon: [name], MD;  [facility];  Service: Orthopedics;  Laterality: Right;  . TOTAL HIP REVISION Right 10/26/2013   Procedure: REVISION RIGHT TOTAL HIP ARTHROPLASTY;  Surgeon: [name], MD;  [facility];  Service: Orthopedics;  Laterality: Right;  . TOTAL HIP REVISION  03/2016  . TOTAL KNEE REVISION Left 04/11/2017    Procedure: TOTAL KNEE REVISION PATELLA and TIBIA;  Surgeon: Frederik Pear, MD;  Location: [place];  Service: Orthopedics;  Laterality: Left;  . WRIST FRACTURE SURGERY  05/2012    Allergies: Short ragweed pollen ext  Medications: Prior to Admission medications   Medication Sig Start Date End Date Taking? Authorizing Provider  acetaminophen (TYLENOL) 500 MG tablet Take 1,000 mg by mouth daily as needed for moderate pain.    [provider]  albuterol (PROVENTIL) (2.5 MG/3ML) 0.083% nebulizer solution Take 3 mLs (2.5 mg total) by nebulization every 4 (four) hours as needed for wheezing or shortness of breath. 11/11/18   Elgergawy, Silver Huguenin, MD  alfuzosin (UROXATRAL) 10 MG 24 hr tablet Take 10 mg by mouth at bedtime.    [provider]  atorvastatin (LIPITOR) 80 MG  tablet Take 1 tablet (80 mg total) by mouth daily. 01/15/19   Skeet Latch, MD  buPROPion (WELLBUTRIN XL) 300 MG 24 hr tablet TAKE 1 TABLET DAILY WITH  BREAKFAST BY MOUTH. Patient taking differently: Take 300 mg by mouth daily.  05/04/18   Plotnikov, Evie Lacks, MD  Calcium Citrate-Vitamin D (CALCIUM CITRATE + D PO) Take 1 tablet by mouth 2 (two) times daily.     [provider]  Cholecalciferol (VITAMIN D-3 PO) Take 2,000 Units by mouth 2 (two) times daily.     [provider]  clonazePAM (KLONOPIN) 1 MG tablet Take 1 tablet (1 mg total) by mouth 3 (three) times daily as needed for anxiety. TAKE 1 TABLET BY MOUTH  EVERY 6 HOURS prn Patient taking differently: Take 1 mg by mouth every 6 (six) hours as needed for anxiety.  11/11/18   Elgergawy, Silver Huguenin, MD  denosumab (PROLIA) 60 MG/ML SOSY injection Inject 60 mg as directed every 6 (six) months. Last injection: Feb 2020 Next injection: August 2020 12/10/15   [provider]  diltiazem (CARDIZEM CD) 240 MG 24 hr capsule TAKE 1 CAPSULE BY MOUTH  DAILY Patient taking differently: Take 240 mg by mouth daily.  06/13/18   Plotnikov, Evie Lacks, MD   doxycycline (VIBRA-TABS) 100 MG tablet Take 1 tablet (100 mg total) by mouth daily. 03/07/19   Plotnikov, Evie Lacks, MD  DULoxetine (CYMBALTA) 60 MG capsule TAKE 1 CAPSULE BY MOUTH  TWICE DAILY Patient taking differently: Take 60 mg by mouth 2 (two) times daily.  06/25/18   Plotnikov, Evie Lacks, MD  EPINEPHrine (EPIPEN 2-PAK) 0.3 mg/0.3 mL IJ SOAJ injection Inject 0.3 mg into the muscle daily as needed (for anaphylactic reaction).    [provider]  ezetimibe (ZETIA) 10 MG tablet Take 1 tablet (10 mg total) by mouth daily. 08/04/18 03/14/19  Skeet Latch, MD  ferrous sulfate 325 (65 FE) MG tablet Take 325 mg by mouth 2 (two) times daily with a meal.    [provider]  HYDROcodone-acetaminophen (NORCO) 7.5-325 MG tablet Take 1 tablet by mouth 4 (four) times daily as needed for moderate pain. Please fill on 04/06/19 Patient not taking: Reported on 03/14/2019 03/07/19   Plotnikov, Evie Lacks, MD  HYDROcodone-acetaminophen (NORCO) 7.5-325 MG tablet Take 1 tablet by mouth 4 (four) times daily as needed for moderate pain. Patient not taking: Reported on 03/14/2019 03/07/19   Plotnikov, Evie Lacks, MD  HYDROcodone-acetaminophen (NORCO) 7.5-325 MG tablet Take 1 tablet by mouth every 6 (six) hours as needed for severe pain. 03/07/19   Plotnikov, Evie Lacks, MD  levothyroxine (SYNTHROID, LEVOTHROID) 200 MCG tablet TAKE 1 TABLET DAILY AT 6 AM BY MOUTH. Patient taking differently: Take 200 mcg by mouth daily before breakfast.  05/04/18   Plotnikov, Evie Lacks, MD  levothyroxine (SYNTHROID, LEVOTHROID) 25 MCG tablet TAKE 1 TABLET BY MOUTH  DAILY BEFORE BREAKFAST. Patient taking differently: Take 25 mcg by mouth daily before breakfast.  05/04/18   Plotnikov, Evie Lacks, MD  loratadine (CLARITIN) 10 MG tablet Take 10 mg by mouth daily.     [provider]  Magnesium 500 MG CAPS Take 1 capsule (500 mg total) by mouth daily. 10/13/18   Brunetta Genera, MD  metoprolol tartrate (LOPRESSOR) 25 MG  tablet Take 1 tablet (25 mg total) by mouth 2 (two) times daily. 10/23/18 03/14/19  Skeet Latch, MD  mirabegron ER (MYRBETRIQ) 25 MG TB24 tablet Take 25 mg by mouth at bedtime.  [provider]  Multiple Vitamin (MULTIVITAMIN WITH MINERALS) TABS tablet Take 1 tablet by mouth daily. Men's One-A-Day 50+    [provider]  nitroGLYCERIN (NITROSTAT) 0.4 MG SL tablet Place 0.4 mg under the tongue every 5 (five) minutes as needed for chest pain. x3 doses as needed for chest pain    [provider]  omeprazole (PRILOSEC) 40 MG capsule TAKE 1 CAPSULE DAILY BEFORE BREAKFAST BY MOUTH. Patient taking differently: Take 40 mg by mouth daily.  05/04/18   Plotnikov, Evie Lacks, MD  ondansetron (ZOFRAN) 4 MG tablet Take 1 tablet (4 mg total) by mouth every 6 (six) hours as needed for nausea. 11/11/18   Elgergawy, Silver Huguenin, MD  OTEZLA 30 MG TABS Take 30 mg by mouth 2 (two) times a day. 01/24/19   [provider]  Probiotic Product (PROBIOTIC PO) Take 1 capsule by mouth daily with breakfast.    [provider]  prochlorperazine (COMPAZINE) 10 MG tablet Take 1 tablet (10 mg total) by mouth every 6 (six) hours as needed (Nausea or vomiting). Patient not taking: Reported on 11/08/2018 06/23/18   Brunetta Genera, MD  testosterone enanthate (DELATESTRYL) 200 MG/ML injection INJECT 1ML (200MG ) INTO THE MUSCLE EVERY 14 DAYS. DISCARD VIAL AFTER 28 DAYS. Patient taking differently: Inject 200 mg into the muscle every 14 (fourteen) days. Last injection: 03/10/2019 11/28/18   Plotnikov, Evie Lacks, MD  tiZANidine (ZANAFLEX) 4 MG tablet TAKE 1 TABLET BY MOUTH  EVERY 6 HOURS AS NEEDED FOR MUSCLE SPASM(S) Patient taking differently: Take 4 mg by mouth every 6 (six) hours as needed for muscle spasms.  08/31/18   Plotnikov, Evie Lacks, MD  vitamin C (ASCORBIC ACID) 500 MG tablet Take 500 mg by mouth daily.    [provider]  XARELTO 10 MG TABS tablet TAKE 1 TABLET DAILY BY   MOUTH. Patient taking differently: Take 10 mg by mouth daily.  03/20/18   Plotnikov, Evie Lacks, MD     Family History  Problem Relation Age of Onset  . CAD Father 70  . Asthma Father   . Alcohol abuse Father   . Arthritis Mother   . Alcohol abuse Sister     Social History   Socioeconomic History  . Marital status: Married    Spouse name: Not on file  . Number of children: Not on file  . Years of education: Not on file  . Highest education level: Not on file  Occupational History  . Not on file  Social Needs  . Financial resource strain: Not on file  . Food insecurity    Worry: Not on file    Inability: Not on file  . Transportation needs    Medical: Not on file    Non-medical: Not on file  Tobacco Use  . Smoking status: Former Smoker    Quit date: 06/05/2012    Years since quitting: 6.8  . Smokeless tobacco: Never Used  Substance and Sexual Activity  . Alcohol use: Yes    Comment: occasional-social  . Drug use: No  . Sexual activity: Yes  Lifestyle  . Physical activity    Days per week: Not on file    Minutes per session: Not on file  . Stress: Not on file  Relationships  . Social Herbalist on phone: Not on file    Gets together: Not on file    Attends religious service: Not on file    Active member of club or  organization: Not on file    Attends meetings of clubs or organizations: Not on file    Relationship status: Not on file  Other Topics Concern  . Not on file  Social History Narrative   [place] 2.5 months, went home Feb 21st slipped and fell on back and developed.  Home PT/OT.  Just started outpatient physical therapy.  Friday night, misstepped.       Review of Systems: A 12 point ROS discussed and pertinent positives are indicated in the HPI above.  All other systems are negative.  Review of Systems  Vital Signs: There were no vitals taken for this visit.  Physical Exam Vitals signs reviewed.  Constitutional:      Appearance: He is  obese.  HENT:     Head: Normocephalic and atraumatic.  Eyes:     Extraocular Movements: Extraocular movements intact.  Neck:     Musculoskeletal: Normal range of motion.  Cardiovascular:     Rate and Rhythm: Normal rate and regular rhythm.  Pulmonary:     Effort: Pulmonary effort is normal.     Breath sounds: Normal breath sounds.  Abdominal:     Palpations: Abdomen is soft.  Musculoskeletal: Normal range of motion.  Skin:    General: Skin is warm and dry.  Neurological:     General: No focal deficit present.     Mental Status: He is alert and oriented to person, place, and time.  Psychiatric:        Mood and Affect: Mood normal.        Behavior: Behavior normal.        Thought Content: Thought content normal.        Judgment: Judgment normal.     Imaging: No results found.  Labs:  CBC: Recent Labs    11/09/18 0500 11/30/18 1403 02/01/19 1037 03/19/19 1222  WBC 6.4 4.9 4.4 5.1  HGB 10.9* 13.6 12.6* 11.9*  HCT 34.6* 42.7 39.8 38.8*  PLT 166 151 155 175    COAGS: Recent Labs    06/22/18 0903  INR 1.02    BMP: Recent Labs    11/09/18 0500 11/30/18 1403 02/01/19 1037 03/19/19 1222  NA 136 139 140 138  K 3.5 4.5 4.8 3.9  CL 102 106 105 102  CO2 26 24 26 29   GLUCOSE 102* 86 131* 101*  BUN 23 33* 22 23  CALCIUM 8.7* 9.0 8.8* 9.3  CREATININE 1.04 1.34* 1.07 1.11  GFRNONAA >60 52* >60 >60  GFRAA >60 >60 >60 >60    LIVER FUNCTION TESTS: Recent Labs    10/02/18 1114 10/12/18 0909 11/30/18 1403 02/01/19 1037  BILITOT 0.6 0.7 0.5 0.4  AST 16 20 25 31   ALT 23 26 34 35  ALKPHOS 58 52 83 68  PROT 5.9* 6.2* 6.7 6.6  ALBUMIN 3.6 3.2* 3.5 3.2*    TUMOR MARKERS: No results for input(s): AFPTM, CEA, CA199, CHROMGRNA in the last 8760 hours.  Assessment and Plan:  Nephrolithiasis  Will proceed with image guided placement of percutaneous nephrostomy tube on the left today by Dr. Kathlene Cote.  Risks and benefits of left PCN placement was discussed  with the patient including, but not limited to, infection, bleeding, significant bleeding causing loss or decrease in renal function or damage to adjacent structures.   All of the patient's questions were answered, patient is agreeable to proceed.  Consent signed and in chart.  Thank you for this interesting consult.  I greatly enjoyed meeting Joseph Dejaynes  Lane and look forward to participating in their care.  A copy of this report was sent to the requesting provider on this date.  Electronically Signed: Murrell Redden, PA-C   03/26/2019, 10:39 AM      I spent a total of    25 Minutes in face to face in clinical consultation, greater than 50% of which was counseling/coordinating care for PCN.

## 2019-03-26 NOTE — Anesthesia Procedure Notes (Signed)
Procedure Name: Intubation Date/Time: 03/26/2019 1:08 PM Performed by: Sharlette Dense, CRNA Patient Re-evaluated:Patient Re-evaluated prior to induction Oxygen Delivery Method: Circle system utilized Preoxygenation: Pre-oxygenation with 100% oxygen Induction Type: IV induction Ventilation: Mask ventilation without difficulty and Oral airway inserted - appropriate to patient size Laryngoscope Size: Miller and 3 Grade View: Grade I Tube type: Oral Tube size: 8.0 mm Number of attempts: 1 Airway Equipment and Method: Stylet Placement Confirmation: ETT inserted through vocal cords under direct vision,  positive ETCO2 and breath sounds checked- equal and bilateral Secured at: 23 cm Tube secured with: Tape Dental Injury: Teeth and Oropharynx as per pre-operative assessment

## 2019-03-26 NOTE — H&P (View-Only) (Signed)
Urology Admission H&P  Chief Complaint: left renal calculus  History of Present Illness: Mr [name] is a 73yo with a hx of nephrolithiasis who developed recurrent proteus UTIs and was found on abdominal imaging to have a large left renal calculus. He denies any LUTS. No recent hematuria. No current flank apin  Past Medical History:  Diagnosis Date  . A-fib (HCC)    hx.- sinus rhythm now  . Acid reflux   . AKI (acute kidney injury) (HCC)    mild  . Anxiety   . Arthritis   . Atrial dilatation, bilateral    Severly, enlargement  per ECHO 11/03/16  . BPH (benign prostatic hyperplasia)   . CAD (coronary artery disease)   . Cancer ([place])   . Depression   . Dislocation of internal right hip prosthesis, sequela   . Dysrhythmia    hx a fib  . Elevated brain natriuretic peptide (BNP) level   . Environmental allergies   . Fracture of wrist 10/23/13   LEFT  . High cholesterol   . History of Graves' disease   . History of kidney stones    surg. removal 02/2017  . History of syncope   . HLD (hyperlipidemia)   . Hypertension   . Hypothyroidism    thyroid removed  . Left knee injury    cap dislocated  . Low testosterone, possible hypogonadism 10/01/2012  . Lumbago   . Metabolic bone disease   . Multiple falls   . Osteoporosis    Severe  . Other abnormalities of gait and mobility   . Plaque psoriasis   . Sleep apnea    cpap machine-settings 17  . Thrombocytopenia ([place])   . Thyroid disease    HX GRAVES DISEASE  . Transfusion history    s/p 12'13 hip surgery  . Unsteady gait   . UTI (urinary tract infection)   . Vertebral compression fracture (HCC)    L1-wears brace   Past Surgical History:  Procedure Laterality Date  . APPENDECTOMY    . CARDIAC CATHETERIZATION  2013  . CATARACT EXTRACTION, BILATERAL    . COLONOSCOPY    . CYSTOSCOPY WITH RETROGRADE PYELOGRAM, URETEROSCOPY AND STENT PLACEMENT Left 11/07/2017   Procedure: CYSTOSCOPY WITH RETROGRADE PYELOGRAM, URETEROSCOPY AND  STENT PLACEMENT;  Surgeon: Cleon Gustin, MD;  Location: [facility];  Service: Urology;  Laterality: Left;  . CYSTOSCOPY/URETEROSCOPY/HOLMIUM LASER/STENT PLACEMENT Right 03/11/2017   Procedure: CYSTOSCOPY/URETEROSCOPYSTENT PLACEMENT right ureter retrograde pylegram;  Surgeon: Cleon Gustin, MD;  Location: [facility];  Service: Urology;  Laterality: Right;  . HARDWARE REMOVAL  10/05/2012   Procedure: HARDWARE REMOVAL;  Surgeon: [name], MD;  Location: [facility];  Service: Orthopedics;  Laterality: Right;  REMOVING  STRYKER  GAMMA NAIL  . HARDWARE REMOVAL Right 07/03/2013   Procedure: HARDWARE REMOVAL RIGHT TIBIA ;  Surgeon: Rozanna Box, MD;  Location: [facility];  Service: Orthopedics;  Laterality: Right;  . HIP CLOSED REDUCTION Right 10/15/2013   Procedure: CLOSED MANIPULATION HIP;  Surgeon: [name], MD;  Location: [facility];  Service: Orthopedics;  Laterality: Right;  . hip revision     June 2017  . HOLMIUM LASER APPLICATION Right 12/16/9022   Procedure: HOLMIUM LASER APPLICATION;  Surgeon: Cleon Gustin, MD;  Location: [facility];  Service: Urology;  Laterality: Right;  . INCISION AND DRAINAGE HIP Right 11/16/2013   Procedure: IRRIGATION AND DEBRIDEMENT RIGHT HIP;  Surgeon: [name], MD;  Location: [facility];  Service: Orthopedics;  Laterality:  Right;  . IR IMAGING GUIDED PORT INSERTION  06/22/2018  . IR URETERAL STENT LEFT NEW ACCESS W/O SEP NEPHROSTOMY CATH  10/24/2017  . JOINT REPLACEMENT     hip-right x2  . KNEE SURGERY Bilateral 2012   Total knee replacements  . NEPHROLITHOTOMY Right 02/08/2017   Procedure: NEPHROLITHOTOMY PERCUTANEOUS WITH SURGEON ACCESS;  Surgeon: Cleon Gustin, MD;  Location: [facility];  Service: Urology;  Laterality: Right;  . NEPHROLITHOTOMY Left 10/24/2017   Procedure: NEPHROLITHOTOMY PERCUTANEOUS;  Surgeon: Cleon Gustin, MD;  Location: [facility];  Service: Urology;  Laterality: Left;  . ORIF TIBIA FRACTURE Right 02/06/2013   Procedure: OPEN REDUCTION  INTERNAL FIXATION (ORIF) TIBIA FRACTURE WITH IM ROD FIBULA;  Surgeon: Rozanna Box, MD;  Location: [place];  Service: Orthopedics;  Laterality: Right;  . ORIF TIBIA FRACTURE Right 07/03/2013   Procedure: RIGHT TIBIA NON UNION REPAIR ;  Surgeon: Rozanna Box, MD;  Location: [facility];  Service: Orthopedics;  Laterality: Right;  . ORIF WRIST FRACTURE  10/02/2012   Procedure: OPEN REDUCTION INTERNAL FIXATION (ORIF) WRIST FRACTURE;  Surgeon: Roseanne Kaufman, MD;  Location: [facility];  Service: Orthopedics;  Laterality: Right;  WITH   ANTIBIOTIC  CEMENT  . ORIF WRIST FRACTURE Left 10/28/2013   Procedure: OPEN REDUCTION INTERNAL FIXATION (ORIF) WRIST FRACTURE with allograft;  Surgeon: Roseanne Kaufman, MD;  Location: [facility];  Service: Orthopedics;  Laterality: Left;  DVR Plate  . QUADRICEPS TENDON REPAIR Left 07/15/2017   Procedure: REPAIR QUADRICEP TENDON;  Surgeon: Frederik Pear, MD;  Location: [facility];  Service: Orthopedics;  Laterality: Left;  . right femur surgery  05/2012  . THYROIDECTOMY  02/1986  . TOTAL HIP REVISION  10/05/2012   Procedure: TOTAL HIP REVISION;  Surgeon: [name], MD;  Location: [facility];  Service: Orthopedics;  Laterality: Right;  RIGHT TOTAL HIP REVISION  . TOTAL HIP REVISION Right 09/17/2013   Procedure: REVISION RIGHT TOTAL HIP ARTHROPLASTY ;  Surgeon: [name], MD;  Location: [facility];  Service: Orthopedics;  Laterality: Right;  . TOTAL HIP REVISION Right 10/26/2013   Procedure: REVISION RIGHT TOTAL HIP ARTHROPLASTY;  Surgeon: [name], MD;  Location: [facility];  Service: Orthopedics;  Laterality: Right;  . TOTAL HIP REVISION  03/2016  . TOTAL KNEE REVISION Left 04/11/2017   Procedure: TOTAL KNEE REVISION PATELLA and TIBIA;  Surgeon: Frederik Pear, MD;  Location: [facility];  Service: Orthopedics;  Laterality: Left;  . WRIST FRACTURE SURGERY  05/2012    Home Medications:  No current facility-administered medications for this encounter.    Facility-Administered Medications Ordered  in Other Encounters  Medication Dose Route Frequency Provider Last Rate Last Dose  . cefTRIAXone (ROCEPHIN) 2 g in sodium chloride 0.9 % 100 mL IVPB  2 g Intravenous 30 min Pre-Op [name], Candee Furbish, MD      . ciprofloxacin (CIPRO) 400 MG/200ML IVPB           . ciprofloxacin (CIPRO) IVPB 400 mg  400 mg Intravenous Once Ardis Rowan, PA-C 200 mL/hr at 03/26/19 1205 400 mg at 03/26/19 1205  . fentaNYL (SUBLIMAZE) 100 MCG/2ML injection           . lactated ringers infusion   Intravenous Continuous Allred, Darrell K, PA-C 75 mL/hr at 03/26/19 1035    . lidocaine (XYLOCAINE) 1 % (with pres) injection           . midazolam (VERSED) 2 MG/2ML injection  Allergies:  Allergies  Allergen Reactions  . Short Ragweed Pollen Ext Cough    Family History  Problem Relation Age of Onset  . CAD Father 33  . Asthma Father   . Alcohol abuse Father   . Arthritis Mother   . Alcohol abuse Sister    Social History:  reports that he quit smoking about 6 years ago. He has never used smokeless tobacco. He reports current alcohol use. He reports that he does not use drugs.  Review of Systems  All other systems reviewed and are negative.   Physical Exam:  Vital signs in last 24 hours: Temp:  [98.5 F (36.9 C)] 98.5 F (36.9 C) (06/15 1015) Pulse Rate:  [77-95] 77 (06/15 1235) Resp:  [18-24] 24 (06/15 1235) BP: (132-163)/(75-113) 149/113 (06/15 1235) SpO2:  [98 %-99 %] 98 % (06/15 1235) Weight:  [136.1 kg] 136.1 kg (06/15 1102) Physical Exam  Constitutional: He is oriented to person, place, and time. He appears well-developed and well-nourished.  HENT:  Head: Normocephalic and atraumatic.  Eyes: Pupils are equal, round, and reactive to light. EOM are normal.  Neck: Normal range of motion. No thyromegaly present.  Cardiovascular: Normal rate and regular rhythm.  Respiratory: Effort normal. No respiratory distress.  GI: Soft. He exhibits no distension.  Musculoskeletal: Normal range  of motion.        General: No edema.  Neurological: He is alert and oriented to person, place, and time.  Skin: Skin is warm and dry.  Psychiatric: He has a normal mood and affect. His behavior is normal. Judgment and thought content normal.    Laboratory Data:  Results for orders placed or performed during the hospital encounter of 03/26/19 (from the past 24 hour(s))  Basic metabolic panel     Status: Abnormal   Collection Time: 03/26/19 10:09 AM  Result Value Ref Range   Sodium 140 135 - 145 mmol/L   Potassium 4.1 3.5 - 5.1 mmol/L   Chloride 109 98 - 111 mmol/L   CO2 24 22 - 32 mmol/L   Glucose, Bld 115 (H) 70 - 99 mg/dL   BUN 24 (H) 8 - 23 mg/dL   Creatinine, Ser 1.20 0.61 - 1.24 mg/dL   Calcium 9.0 8.9 - 10.3 mg/dL   GFR calc non Af Amer 60 (L) >60 mL/min   GFR calc Af Amer >60 >60 mL/min   Anion gap 7 5 - 15  CBC with Differential/Platelet     Status: Abnormal   Collection Time: 03/26/19 10:09 AM  Result Value Ref Range   WBC 5.3 4.0 - 10.5 K/uL   RBC 4.31 4.22 - 5.81 MIL/uL   Hemoglobin 13.2 13.0 - 17.0 g/dL   HCT 41.1 39.0 - 52.0 %   MCV 95.4 80.0 - 100.0 fL   MCH 30.6 26.0 - 34.0 pg   MCHC 32.1 30.0 - 36.0 g/dL   RDW 15.9 (H) 11.5 - 15.5 %   Platelets 209 150 - 400 K/uL   nRBC 0.0 0.0 - 0.2 %   Neutrophils Relative % 58 %   Neutro Abs 3.1 1.7 - 7.7 K/uL   Lymphocytes Relative 28 %   Lymphs Abs 1.5 0.7 - 4.0 K/uL   Monocytes Relative 8 %   Monocytes Absolute 0.4 0.1 - 1.0 K/uL   Eosinophils Relative 5 %   Eosinophils Absolute 0.3 0.0 - 0.5 K/uL   Basophils Relative 1 %   Basophils Absolute 0.0 0.0 - 0.1 K/uL   Immature Granulocytes 0 %  Abs Immature Granulocytes 0.02 0.00 - 0.07 K/uL  Protime-INR     Status: None   Collection Time: 03/26/19 10:09 AM  Result Value Ref Range   Prothrombin Time 13.3 11.4 - 15.2 seconds   INR 1.0 0.8 - 1.2   Recent Results (from the past 240 hour(s))  Novel Coronavirus, NAA (hospital order; send-out to ref lab)     Status:  None   Collection Time: 03/22/19 10:59 AM   Specimen: Nasopharyngeal Swab; Respiratory  Result Value Ref Range Status   SARS-CoV-2, NAA NOT DETECTED NOT DETECTED Final    Comment: (NOTE) This test was developed and its performance characteristics determined by [employer]. This test has not been FDA cleared or approved. This test has been authorized by FDA under an Emergency Use Authorization (EUA). This test is only authorized for the duration of time the declaration that circumstances exist justifying the authorization of the emergency use of in vitro diagnostic tests for detection of SARS-CoV-2 virus and/or diagnosis of COVID-19 infection under section 564(b)(1) of the Act, 21 U.S.C. 585IDP-8(E)(4), unless the authorization is terminated or revoked sooner. When diagnostic testing is negative, the possibility of a false negative result should be considered in the context of a patient's recent exposures and the presence of clinical signs and symptoms consistent with COVID-19. An individual without symptoms of COVID-19 and who is not shedding SARS-CoV-2 virus would expect to have a negative (not detected) result in this assay. Performed  At: [facility] [address] [place], [place] [id number] Rush Farmer MD [id number]    [place]  Final    Comment: Performed at [facility], [place] [address] [place], [place] [address]   Creatinine: Recent Labs    03/26/19 1009  CREATININE 1.20   Baseline Creatinine: 1.2  Impression/Assessment:  72yo with left staghorn renal calculus  Plan:  The risks/benefits/alternatives to Left PCNL was explained to the patient and he understands and wishes to proceed with surgery  [name] 03/26/2019, 12:49 PM

## 2019-03-27 ENCOUNTER — Encounter (HOSPITAL_COMMUNITY): Payer: Self-pay | Admitting: Urology

## 2019-03-27 DIAGNOSIS — N200 Calculus of kidney: Secondary | ICD-10-CM | POA: Diagnosis not present

## 2019-03-27 LAB — BASIC METABOLIC PANEL
Anion gap: 11 (ref 5–15)
BUN: 25 mg/dL — ABNORMAL HIGH (ref 8–23)
CO2: 24 mmol/L (ref 22–32)
Calcium: 8.6 mg/dL — ABNORMAL LOW (ref 8.9–10.3)
Chloride: 104 mmol/L (ref 98–111)
Creatinine, Ser: 1.29 mg/dL — ABNORMAL HIGH (ref 0.61–1.24)
GFR calc Af Amer: >60 mL/min (ref >60)
GFR calc non Af Amer: 55 mL/min — ABNORMAL LOW (ref >60)
Glucose, Bld: 109 mg/dL — ABNORMAL HIGH (ref 70–99)
Potassium: 4.7 mmol/L (ref 3.5–5.1)
Sodium: 139 mmol/L (ref 135–145)

## 2019-03-27 LAB — CBC
HCT: 39.7 % (ref 39.0–52.0)
Hemoglobin: 12.3 g/dL — ABNORMAL LOW (ref 13.0–17.0)
MCH: 29.4 pg (ref 26.0–34.0)
MCHC: 31 g/dL (ref 30.0–36.0)
MCV: 94.7 fL (ref 80.0–100.0)
Platelets: 194 10*3/uL (ref 150–400)
RBC: 4.19 MIL/uL — ABNORMAL LOW (ref 4.22–5.81)
RDW: 15.9 % — ABNORMAL HIGH (ref 11.5–15.5)
WBC: 9.9 10*3/uL (ref 4.0–10.5)
nRBC: 0 % (ref 0.0–0.2)

## 2019-03-27 MED ORDER — DEXTROSE 5 % IV SOLN: 3.0000 g | INTRAVENOUS | Status: DC

## 2019-03-27 MED ORDER — SODIUM CHLORIDE 0.9 % IV SOLN
1.0000 g | Freq: Once | INTRAVENOUS | Status: AC
  Administered 2019-03-28: 1 g via INTRAVENOUS
  Filled 2019-03-27: qty 10
  Filled 2019-03-27: qty 1

## 2019-03-27 NOTE — Plan of Care (Signed)
  Problem: Education: Goal: Knowledge of General Education information will improve Description: Including pain rating scale, medication(s)/side effects and non-pharmacologic comfort measures Outcome: Completed/Met   Problem: Clinical Measurements: Goal: Ability to maintain clinical measurements within normal limits will improve Outcome: Completed/Met Goal: Respiratory complications will improve Outcome: Completed/Met   Problem: Activity: Goal: Risk for activity intolerance will decrease Outcome: Progressing   Problem: Nutrition: Goal: Adequate nutrition will be maintained Outcome: Progressing

## 2019-03-27 NOTE — Progress Notes (Signed)
Referring Physician(s): [place]  Supervising Physician: Aletta Edouard  Patient Status:  [facility] - In-pt  Chief Complaint:  Left flank pain, left renal stone  Subjective: Patient seen by IR staff yesterday for left nephroureteral catheter placement prior to nephrolithotomy.  Access was lost during nephrolithotomy with significant residual stone disease and patient presents again today for new left nephrostomy placement prior to nephrolithotomy at later date.  He currently denies fever, headache, chest pain, dyspnea, cough, abdominal pain, nausea, vomiting.  He does have continued left flank discomfort.  Foley catheter in place draining tea colored urine.  Past Medical History:  Diagnosis Date  . A-fib (HCC)    hx.- sinus rhythm now  . Acid reflux   . AKI (acute kidney injury) (HCC)    mild  . Anxiety   . Arthritis   . Atrial dilatation, bilateral    Severly, enlargement  per ECHO 11/03/16  . BPH (benign prostatic hyperplasia)   . CAD (coronary artery disease)   . Cancer ([place])   . Depression   . Dislocation of internal right hip prosthesis, sequela   . Dysrhythmia    hx a fib  . Elevated brain natriuretic peptide (BNP) level   . Environmental allergies   . Fracture of wrist 10/23/13   LEFT  . High cholesterol   . History of Graves' disease   . History of kidney stones    surg. removal 02/2017  . History of syncope   . HLD (hyperlipidemia)   . Hypertension   . Hypothyroidism    thyroid removed  . Left knee injury    cap dislocated  . Low testosterone, possible hypogonadism 10/01/2012  . Lumbago   . Metabolic bone disease   . Multiple falls   . Osteoporosis    Severe  . Other abnormalities of gait and mobility   . Plaque psoriasis   . Sleep apnea    cpap machine-settings 17  . Thrombocytopenia ([place])   . Thyroid disease    HX GRAVES DISEASE  . Transfusion history    s/p 12'13 hip surgery  . Unsteady gait   . UTI (urinary tract infection)   . Vertebral  compression fracture (HCC)    L1-wears brace   Past Surgical History:  Procedure Laterality Date  . APPENDECTOMY    . CARDIAC CATHETERIZATION  2013  . CATARACT EXTRACTION, BILATERAL    . COLONOSCOPY    . CYSTOSCOPY WITH RETROGRADE PYELOGRAM, URETEROSCOPY AND STENT PLACEMENT Left 11/07/2017   Procedure: CYSTOSCOPY WITH RETROGRADE PYELOGRAM, URETEROSCOPY AND STENT PLACEMENT;  Surgeon: Cleon Gustin, MD;  Location: [facility];  Service: Urology;  Laterality: Left;  . CYSTOSCOPY/URETEROSCOPY/HOLMIUM LASER/STENT PLACEMENT Right 03/11/2017   Procedure: CYSTOSCOPY/URETEROSCOPYSTENT PLACEMENT right ureter retrograde pylegram;  Surgeon: Cleon Gustin, MD;  Location: [facility];  Service: Urology;  Laterality: Right;  . HARDWARE REMOVAL  10/05/2012   Procedure: HARDWARE REMOVAL;  Surgeon: [name], MD;  Location: [facility];  Service: Orthopedics;  Laterality: Right;  REMOVING  STRYKER  GAMMA NAIL  . HARDWARE REMOVAL Right 07/03/2013   Procedure: HARDWARE REMOVAL RIGHT TIBIA ;  Surgeon: Rozanna Box, MD;  Location: [place];  Service: Orthopedics;  Laterality: Right;  . HIP CLOSED REDUCTION Right 10/15/2013   Procedure: CLOSED MANIPULATION HIP;  Surgeon: [name], MD;  [facility]: [facility];  Service: Orthopedics;  Laterality: Right;  . hip revision     June 2017  . HOLMIUM LASER APPLICATION Right 06/15/6386   Procedure: HOLMIUM LASER  APPLICATION;  Surgeon: Cleon Gustin, MD;  [facility];  Service: Urology;  Laterality: Right;  . HOLMIUM LASER APPLICATION Left 12/30/252   Procedure: HOLMIUM LASER APPLICATION;  Surgeon: Cleon Gustin, MD;  [facility];  Service: Urology;  Laterality: Left;  . INCISION AND DRAINAGE HIP Right 11/16/2013   Procedure: IRRIGATION AND DEBRIDEMENT RIGHT HIP;  Surgeon: [name], MD;  [facility];  Service: Orthopedics;  Laterality: Right;  . IR IMAGING GUIDED PORT INSERTION  06/22/2018  . IR URETERAL STENT LEFT NEW ACCESS W/O SEP NEPHROSTOMY  CATH  10/24/2017  . IR URETERAL STENT LEFT NEW ACCESS W/O SEP NEPHROSTOMY CATH  03/26/2019  . JOINT REPLACEMENT     hip-right x2  . KNEE SURGERY Bilateral 2012   Total knee replacements  . NEPHROLITHOTOMY Right 02/08/2017   Procedure: NEPHROLITHOTOMY PERCUTANEOUS WITH SURGEON ACCESS;  Surgeon: Cleon Gustin, MD;  [facility];  Service: Urology;  Laterality: Right;  . NEPHROLITHOTOMY Left 10/24/2017   Procedure: NEPHROLITHOTOMY PERCUTANEOUS;  Surgeon: Cleon Gustin, MD;  [facility];  Service: Urology;  Laterality: Left;  . NEPHROLITHOTOMY Left 03/26/2019   Procedure: NEPHROLITHOTOMY PERCUTANEOUS;  Surgeon: Cleon Gustin, MD;  [facility];  Service: Urology;  Laterality: Left;  2 HRS  . ORIF TIBIA FRACTURE Right 02/06/2013   Procedure: OPEN REDUCTION INTERNAL FIXATION (ORIF) TIBIA FRACTURE WITH IM ROD FIBULA;  Surgeon: Rozanna Box, MD;  Location: [place];  Service: Orthopedics;  Laterality: Right;  . ORIF TIBIA FRACTURE Right 07/03/2013   Procedure: RIGHT TIBIA NON UNION REPAIR ;  Surgeon: Rozanna Box, MD;  Location: [place];  Service: Orthopedics;  Laterality: Right;  . ORIF WRIST FRACTURE  10/02/2012   Procedure: OPEN REDUCTION INTERNAL FIXATION (ORIF) WRIST FRACTURE;  Surgeon: Roseanne Kaufman, MD;  [facility];  Service: Orthopedics;  Laterality: Right;  WITH   ANTIBIOTIC  CEMENT  . ORIF WRIST FRACTURE Left 10/28/2013   Procedure: OPEN REDUCTION INTERNAL FIXATION (ORIF) WRIST FRACTURE with allograft;  Surgeon: Roseanne Kaufman, MD;  [facility];  Service: Orthopedics;  Laterality: Left;  DVR Plate  . QUADRICEPS TENDON REPAIR Left 07/15/2017   Procedure: REPAIR QUADRICEP TENDON;  Surgeon: Frederik Pear, MD;  Location: [place];  Service: Orthopedics;  Laterality: Left;  . right femur surgery  05/2012  . THYROIDECTOMY  02/1986  . TOTAL HIP REVISION  10/05/2012   Procedure: TOTAL HIP REVISION;  Surgeon: [name], MD;  [facility];  Service: Orthopedics;   Laterality: Right;  RIGHT TOTAL HIP REVISION  . TOTAL HIP REVISION Right 09/17/2013   Procedure: REVISION RIGHT TOTAL HIP ARTHROPLASTY ;  Surgeon: [name], MD;  [facility];  Service: Orthopedics;  Laterality: Right;  . TOTAL HIP REVISION Right 10/26/2013   Procedure: REVISION RIGHT TOTAL HIP ARTHROPLASTY;  Surgeon: [name], MD;  [facility];  Service: Orthopedics;  Laterality: Right;  . TOTAL HIP REVISION  03/2016  . TOTAL KNEE REVISION Left 04/11/2017   Procedure: TOTAL KNEE REVISION PATELLA and TIBIA;  Surgeon: Frederik Pear, MD;  Location: [place];  Service: Orthopedics;  Laterality: Left;  . WRIST FRACTURE SURGERY  05/2012      Allergies: Short ragweed pollen ext  Medications: Prior to Admission medications   Medication Sig Start Date End Date Taking? Authorizing Provider  acetaminophen (TYLENOL) 500 MG tablet Take 1,000 mg by mouth daily as needed for moderate pain.   Yes [provider]  albuterol (PROVENTIL) (2.5 MG/3ML) 0.083% nebulizer solution Take 3 mLs (2.5 mg total) by nebulization every 4 (four) hours as needed for wheezing or shortness of breath. 11/11/18  Yes Elgergawy, Silver Huguenin, MD  alfuzosin (UROXATRAL) 10 MG 24 hr tablet Take 10 mg by mouth at bedtime.   Yes [provider]  atorvastatin (LIPITOR) 80 MG tablet Take 1 tablet (80 mg total) by mouth daily. 01/15/19  Yes Skeet Latch, MD  buPROPion (WELLBUTRIN XL) 300 MG 24 hr tablet TAKE 1 TABLET DAILY WITH  BREAKFAST BY MOUTH. Patient taking differently: Take 300 mg by mouth daily.  05/04/18  Yes Plotnikov, Evie Lacks, MD  Calcium Citrate-Vitamin D (CALCIUM CITRATE + D PO) Take 1 tablet by mouth 2 (two) times daily.    Yes [provider]  Cholecalciferol (VITAMIN D-3 PO) Take 2,000 Units by mouth 2 (two) times daily.    Yes [provider]  clonazePAM (KLONOPIN) 1 MG tablet Take 1 tablet (1 mg total) by mouth 3 (three) times daily as needed for anxiety. TAKE 1 TABLET BY  MOUTH  EVERY 6 HOURS prn Patient taking differently: Take 1 mg by mouth every 6 (six) hours as needed for anxiety.  11/11/18  Yes Elgergawy, Silver Huguenin, MD  denosumab (PROLIA) 60 MG/ML SOSY injection Inject 60 mg as directed every 6 (six) months. Last injection: Feb 2020 Next injection: August 2020 12/10/15  Yes [provider]  diltiazem (CARDIZEM CD) 240 MG 24 hr capsule TAKE 1 CAPSULE BY MOUTH  DAILY Patient taking differently: Take 240 mg by mouth daily.  06/13/18  Yes Plotnikov, Evie Lacks, MD  doxycycline (VIBRA-TABS) 100 MG tablet Take 1 tablet (100 mg total) by mouth daily. 03/07/19  Yes Plotnikov, Evie Lacks, MD  DULoxetine (CYMBALTA) 60 MG capsule TAKE 1 CAPSULE BY MOUTH  TWICE DAILY Patient taking differently: Take 60 mg by mouth 2 (two) times daily.  06/25/18  Yes Plotnikov, Evie Lacks, MD  EPINEPHrine (EPIPEN 2-PAK) 0.3 mg/0.3 mL IJ SOAJ injection Inject 0.3 mg into the muscle daily as needed (for anaphylactic reaction).   Yes [provider]  ezetimibe (ZETIA) 10 MG tablet Take 1 tablet (10 mg total) by mouth daily. 08/04/18 03/14/19 Yes Skeet Latch, MD  ferrous sulfate 325 (65 FE) MG tablet Take 325 mg by mouth 2 (two) times daily with a meal.   Yes [provider]  HYDROcodone-acetaminophen (NORCO) 7.5-325 MG tablet Take 1 tablet by mouth every 6 (six) hours as needed for severe pain. 03/07/19  Yes Plotnikov, Evie Lacks, MD  levothyroxine (SYNTHROID, LEVOTHROID) 200 MCG tablet TAKE 1 TABLET DAILY AT 6 AM BY MOUTH. Patient taking differently: Take 200 mcg by mouth daily before breakfast.  05/04/18  Yes Plotnikov, Evie Lacks, MD  levothyroxine (SYNTHROID, LEVOTHROID) 25 MCG tablet TAKE 1 TABLET BY MOUTH  DAILY BEFORE BREAKFAST. Patient taking differently: Take 25 mcg by mouth daily before breakfast.  05/04/18  Yes Plotnikov, Evie Lacks, MD  loratadine (CLARITIN) 10 MG tablet Take 10 mg by mouth daily.    Yes [provider]  Magnesium 500 MG CAPS Take 1 capsule  (500 mg total) by mouth daily. 10/13/18  Yes Brunetta Genera, MD  metoprolol tartrate (LOPRESSOR) 25 MG tablet Take 1 tablet (25 mg total) by mouth 2 (two) times daily. 10/23/18 03/14/19 Yes Skeet Latch, MD  mirabegron ER (MYRBETRIQ) 25 MG TB24 tablet Take 25 mg by mouth at bedtime.    Yes [provider]  Multiple Vitamin (MULTIVITAMIN WITH MINERALS) TABS  tablet Take 1 tablet by mouth daily. Men's One-A-Day 50+   Yes [provider]  nitroGLYCERIN (NITROSTAT) 0.4 MG SL tablet Place 0.4 mg under the tongue every 5 (five) minutes as needed for chest pain. x3 doses as needed for chest pain   Yes [provider]  omeprazole (PRILOSEC) 40 MG capsule TAKE 1 CAPSULE DAILY BEFORE BREAKFAST BY MOUTH. Patient taking differently: Take 40 mg by mouth daily.  05/04/18  Yes Plotnikov, Evie Lacks, MD  OTEZLA 30 MG TABS Take 30 mg by mouth 2 (two) times a day. 01/24/19  Yes [provider]  Probiotic Product (PROBIOTIC PO) Take 1 capsule by mouth daily with breakfast.   Yes [provider]  testosterone enanthate (DELATESTRYL) 200 MG/ML injection INJECT 1ML (200MG ) INTO THE MUSCLE EVERY 14 DAYS. DISCARD VIAL AFTER 28 DAYS. Patient taking differently: Inject 200 mg into the muscle every 14 (fourteen) days. Last injection: 03/10/2019 11/28/18  Yes Plotnikov, Evie Lacks, MD  tiZANidine (ZANAFLEX) 4 MG tablet TAKE 1 TABLET BY MOUTH  EVERY 6 HOURS AS NEEDED FOR MUSCLE SPASM(S) Patient taking differently: Take 4 mg by mouth every 6 (six) hours as needed for muscle spasms.  08/31/18  Yes Plotnikov, Evie Lacks, MD  vitamin C (ASCORBIC ACID) 500 MG tablet Take 500 mg by mouth daily.   Yes [provider]  XARELTO 10 MG TABS tablet TAKE 1 TABLET DAILY BY  MOUTH. Patient taking differently: Take 10 mg by mouth daily.  03/20/18  Yes Plotnikov, Evie Lacks, MD  HYDROcodone-acetaminophen (NORCO) 7.5-325 MG tablet Take 1 tablet by mouth 4 (four) times daily as needed for moderate  pain. Please fill on 04/06/19 Patient not taking: Reported on 03/14/2019 03/07/19   Plotnikov, Evie Lacks, MD  HYDROcodone-acetaminophen (NORCO) 7.5-325 MG tablet Take 1 tablet by mouth 4 (four) times daily as needed for moderate pain. Patient not taking: Reported on 03/14/2019 03/07/19   Plotnikov, Evie Lacks, MD  ondansetron (ZOFRAN) 4 MG tablet Take 1 tablet (4 mg total) by mouth every 6 (six) hours as needed for nausea. 11/11/18   Elgergawy, Silver Huguenin, MD  prochlorperazine (COMPAZINE) 10 MG tablet Take 1 tablet (10 mg total) by mouth every 6 (six) hours as needed (Nausea or vomiting). Patient not taking: Reported on 11/08/2018 06/23/18   Brunetta Genera, MD     Vital Signs: BP 138/73 (BP Location: Left Arm)   Pulse 86   Temp 97.7 F (36.5 C) (Oral)   Resp 18   Ht 6\' 3"  (1.905 m)   Wt 299 lb 13.2 oz (136 kg)   SpO2 95%   BMI 37.48 kg/m   Physical Exam awake, alert.  Chest clear to auscultation bilaterally.  Heart with nl rate, irregular rhythm.  Abdomen obese, soft, positive bowel sounds, nontender.  Dressing over previous access site left flank, mildly tender to palpation. Ext with full range of motion.  Imaging: Dg C-arm 1-60 Min-no Report  Result Date: 03/26/2019 Fluoroscopy was utilized by the requesting physician.  No radiographic interpretation.   Ir Ureteral Stent Left New Access W/o Sep Nephrostomy Cath  Result Date: 03/26/2019 INDICATION: Left-sided staghorn renal calculus and need for percutaneous access prior to operative nephrolithotomy. EXAM: PERCUTANEOUS LEFT RENAL ACCESS AND URETERAL CATHETER PLACEMENT UNDER FLUOROSCOPY COMPARISON:  CT of the abdomen and pelvis without contrast on 01/23/2019 MEDICATIONS: 400 mg IV Cipro; The antibiotic was administered in an appropriate time frame prior to skin puncture. ANESTHESIA/SEDATION: Fentanyl 100 mcg IV; Versed 3.0 mg IV Moderate Sedation Time:  26 minutes. The patient was continuously monitored during the procedure by the  interventional radiology nurse under my direct supervision. CONTRAST:  15 mL Omnipaque 300-administered into the collecting system(s) FLUOROSCOPY TIME:  Fluoroscopy Time: 9 minutes and 54 seconds. 422.1 mGy. COMPLICATIONS: None immediate. PROCEDURE: Informed written consent was obtained from the patient after a thorough discussion of the procedural risks, benefits and alternatives. All questions were addressed. Maximal Sterile Barrier Technique was utilized including caps, mask, sterile gowns, sterile gloves, sterile drape, hand hygiene and skin antiseptic. A timeout was performed prior to the initiation of the procedure. The left flank region was prepped with chlorhexidine in a prone position. Initial 21 gauge needle pass was made under ultrasound guidance into the lower pole of the left kidney. Aspiration was performed through the needle and the needle removed. Under fluoroscopy, a lower pole calyx containing a calculus was targeted with a 21 gauge needle. Aspiration was performed followed by contrast injection. A guidewire was advanced into the collecting system. A transitional dilator was placed. Over a standard guidewire, a 5 French catheter was placed. The catheter was further directed into the proximal ureter over a hydrophilic wire. The catheter was then further advanced down the ureter and into the bladder. The catheter was capped and secured at the skin with a silk retention suture. FINDINGS: Ultrasound demonstrates no evidence of hydronephrosis. There is intense shadowing in the central collecting system due to the known staghorn calculus occupying the renal pelvis. With a single pass into the lower pole of the left kidney, there was inability to access the collecting system. A smaller calculus located in a lower pole calyx was targeted under fluoroscopy and accessed allowing injection of contrast into the collecting system. After obtaining access in the collecting system, catheter access was then able to  be directed centrally and down the left ureter into the bladder. Five French ureteral catheter access will be utilized during operative percutaneous nephrolithotomy. IMPRESSION: Percutaneous left renal access at the level of a lower pole calyx containing a calculus with advancement of a 5 French catheter past the large central staghorn calculus occupying the renal pelvis and down the ureter and into the bladder. This access will be utilized for percutaneous nephrolithotomy later today. Electronically Signed   By: Aletta Edouard M.D.   On: 03/26/2019 14:58    Labs:  CBC: Recent Labs    02/01/19 1037 03/19/19 1222 03/26/19 1009 03/27/19 0419  WBC 4.4 5.1 5.3 9.9  HGB 12.6* 11.9* 13.2 12.3*  HCT 39.8 38.8* 41.1 39.7  PLT 155 175 209 194    COAGS: Recent Labs    06/22/18 0903 03/26/19 1009  INR 1.02 1.0    BMP: Recent Labs    02/01/19 1037 03/19/19 1222 03/26/19 1009 03/27/19 0419  NA 140 138 140 139  K 4.8 3.9 4.1 4.7  CL 105 102 109 104  CO2 26 29 24 24   GLUCOSE 131* 101* 115* 109*  BUN 22 23 24* 25*  CALCIUM 8.8* 9.3 9.0 8.6*  CREATININE 1.07 1.11 1.20 1.29*  GFRNONAA >60 >60 60* 55*  GFRAA >60 >60 >60 >60    LIVER FUNCTION TESTS: Recent Labs    10/02/18 1114 10/12/18 0909 11/30/18 1403 02/01/19 1037  BILITOT 0.6 0.7 0.5 0.4  AST 16 20 25 31   ALT 23 26 34 35  ALKPHOS 58 52 83 68  PROT 5.9* 6.2* 6.7 6.6  ALBUMIN 3.6 3.2* 3.5 3.2*    Assessment and Plan: Patient with history of lymphoma,  afib, CAD, HTN, BPH, OSA, HLD, hypothyroidism, nephrolithiasis, recurrent Proteus UTIs, large left renal stone, status post left percutaneous nephrostomy yesterday prior to nephrolithotomy; left renal access lost during nephrolithotomy and request now made for replacement of left nephrostomy prior to additional nephrolithotomy at later date.Risks and benefits of left PCN placement was discussed with the patient including, but not limited to, infection, bleeding, significant  bleeding causing loss or decrease in renal function or damage to adjacent structures.   All of the patient's questions were answered, patient is agreeable to proceed.  Consent signed and in chart. Creat 1.29  WBC NL, HGB 12.3, PLTS 194K  Procedure scheduled for today    Electronically Signed: D. Rowe Robert, PA-C 03/27/2019, 11:53 AM   I spent a total of 20 minutes at the the patient's bedside AND on the patient's hospital floor or unit, greater than 50% of which was counseling/coordinating care for left percutaneous nephrostomy    Patient ID: [name], male   DOB: [birth year], 73 y.o.   MRN: [id number]

## 2019-03-28 ENCOUNTER — Observation Stay (HOSPITAL_COMMUNITY): Payer: Medicare Other

## 2019-03-28 DIAGNOSIS — N200 Calculus of kidney: Secondary | ICD-10-CM | POA: Diagnosis not present

## 2019-03-28 HISTORY — PX: IR NEPHROSTOMY PLACEMENT LEFT: IMG6063

## 2019-03-28 MED ORDER — MIDAZOLAM HCL 2 MG/2ML IJ SOLN
INTRAMUSCULAR | Status: AC | PRN
  Administered 2019-03-28: 1 mg via INTRAVENOUS
  Administered 2019-03-28: 2 mg via INTRAVENOUS
  Administered 2019-03-28 (×3): 1 mg via INTRAVENOUS

## 2019-03-28 MED ORDER — LIDOCAINE HCL 1 % IJ SOLN
INTRAMUSCULAR | Status: AC
  Filled 2019-03-28: qty 20

## 2019-03-28 MED ORDER — MIDAZOLAM HCL 2 MG/2ML IJ SOLN
INTRAMUSCULAR | Status: AC
  Filled 2019-03-28: qty 4

## 2019-03-28 MED ORDER — FENTANYL CITRATE (PF) 100 MCG/2ML IJ SOLN
INTRAMUSCULAR | Status: AC
  Filled 2019-03-28: qty 2

## 2019-03-28 MED ORDER — LIDOCAINE HCL (PF) 1 % IJ SOLN
INTRAMUSCULAR | Status: AC | PRN
  Administered 2019-03-28: 10 mL

## 2019-03-28 MED ORDER — IOHEXOL 300 MG/ML  SOLN
50.0000 mL | Freq: Once | INTRAMUSCULAR | Status: AC | PRN
  Administered 2019-03-28: 25 mL

## 2019-03-28 MED ORDER — FENTANYL CITRATE (PF) 100 MCG/2ML IJ SOLN
INTRAMUSCULAR | Status: AC | PRN
  Administered 2019-03-28 (×4): 50 ug via INTRAVENOUS

## 2019-03-28 MED ORDER — MIDAZOLAM HCL 2 MG/2ML IJ SOLN
INTRAMUSCULAR | Status: AC
  Filled 2019-03-28: qty 2

## 2019-03-28 NOTE — Progress Notes (Signed)
2 Days Post-Op Subjective: Patient reports no flank pain. Nephrostomy tube unable to be placed yesterday and patient is on schedule today for neph tube placement  Objective: Vital signs in last 24 hours: Temp:  [98 F (36.7 C)-100.7 F (38.2 C)] 98 F (36.7 C) (06/17 1650) Pulse Rate:  [73-105] 103 (06/17 1650) Resp:  [19-30] 20 (06/17 1650) BP: (140-165)/(60-105) 148/86 (06/17 1650) SpO2:  [86 %-99 %] 94 % (06/17 1650)  Intake/Output from previous day: 06/16 0701 - 06/17 0700 In: 2153.3 [P.O.:480; I.V.:1673.3] Out: 2525 [Urine:2525] Intake/Output this shift: Total I/O In: 1076.2 [P.O.:240; I.V.:736.2; IV Piggyback:100] Out: 1050 [Urine:1050]  Physical Exam:  General:alert, cooperative and appears stated age GI: soft, non tender, normal bowel sounds, no palpable masses, no organomegaly, no inguinal hernia Male genitalia: not done Extremities: extremities normal, atraumatic, no cyanosis or edema  Lab Results: Recent Labs    03/26/19 1009 03/27/19 0419  HGB 13.2 12.3*  HCT 41.1 39.7   BMET Recent Labs    03/26/19 1009 03/27/19 0419  NA 140 139  K 4.1 4.7  CL 109 104  CO2 24 24  GLUCOSE 115* 109*  BUN 24* 25*  CREATININE 1.20 1.29*  CALCIUM 9.0 8.6*   Recent Labs    03/26/19 1009  INR 1.0   No results for input(s): LABURIN in the last 72 hours. Results for orders placed or performed during the hospital encounter of 03/22/19  Novel Coronavirus, NAA (hospital order; send-out to ref lab)     Status: None   Collection Time: 03/22/19 10:59 AM   Specimen: Nasopharyngeal Swab; Respiratory  Result Value Ref Range Status   SARS-CoV-2, NAA NOT DETECTED NOT DETECTED Final    Comment: (NOTE) This test was developed and its performance characteristics determined by [employer]. This test has not been FDA cleared or approved. This test has been authorized by FDA under an Emergency Use Authorization (EUA). This test is only authorized for the duration of  time the declaration that circumstances exist justifying the authorization of the emergency use of in vitro diagnostic tests for detection of SARS-CoV-2 virus and/or diagnosis of COVID-19 infection under section 564(b)(1) of the Act, 21 U.S.C. 622WLN-9(G)(9), unless the authorization is terminated or revoked sooner. When diagnostic testing is negative, the possibility of a false negative result should be considered in the context of a patient's recent exposures and the presence of clinical signs and symptoms consistent with COVID-19. An individual without symptoms of COVID-19 and who is not shedding SARS-CoV-2 virus would expect to have a negative (not detected) result in this assay. Performed  At: [facility] [address] [place], [place] [id number] Rush Farmer MD CX:[id number]    [place]  Final    Comment: Performed at [facility], [place] [address], [place] [address]    Studies/Results: No results found.  Assessment/Plan: POD#2 PCNL  1. Nephrostomy tube to be placed today. Patient will be discharged tomorrow    LOS: 0 days   [name] 03/28/2019, 6:26 PM

## 2019-03-28 NOTE — Progress Notes (Signed)
2 Days Post-Op Subjective: Patient reports pain control good.  Objective: Vital signs in last 24 hours: Temp:  [98.6 F (37 C)-99.6 F (37.6 C)] 98.6 F (37 C) (06/17 0504) Pulse Rate:  [88-95] 89 (06/17 0504) Resp:  [20] 20 (06/17 0504) BP: (112-158)/(60-73) 146/60 (06/17 0504) SpO2:  [92 %-97 %] 93 % (06/17 0504)  Intake/Output from previous day: 06/16 0701 - 06/17 0700 In: 2153.3 [P.O.:480; I.V.:1673.3] Out: 2525 [Urine:2525] Intake/Output this shift: Total I/O In: 371.2 [I.V.:371.2] Out: 650 [Urine:650]  Physical Exam:  General:alert, cooperative and appears stated age GI: soft, non tender, normal bowel sounds, no palpable masses, no organomegaly, no inguinal hernia Male genitalia: not done Extremities: extremities normal, atraumatic, no cyanosis or edema  Lab Results: Recent Labs    03/26/19 1009 03/27/19 0419  HGB 13.2 12.3*  HCT 41.1 39.7   BMET Recent Labs    03/26/19 1009 03/27/19 0419  NA 140 139  K 4.1 4.7  CL 109 104  CO2 24 24  GLUCOSE 115* 109*  BUN 24* 25*  CREATININE 1.20 1.29*  CALCIUM 9.0 8.6*   Recent Labs    03/26/19 1009  INR 1.0   No results for input(s): LABURIN in the last 72 hours. Results for orders placed or performed during the hospital encounter of 03/22/19  Novel Coronavirus, NAA (hospital order; send-out to ref lab)     Status: None   Collection Time: 03/22/19 10:59 AM   Specimen: Nasopharyngeal Swab; Respiratory  Result Value Ref Range Status   SARS-CoV-2, NAA NOT DETECTED NOT DETECTED Final    Comment: (NOTE) This test was developed and its performance characteristics determined by [employer]. This test has not been FDA cleared or approved. This test has been authorized by FDA under an Emergency Use Authorization (EUA). This test is only authorized for the duration of time the declaration that circumstances exist justifying the authorization of the emergency use of in vitro diagnostic tests for detection  of SARS-CoV-2 virus and/or diagnosis of COVID-19 infection under section 564(b)(1) of the Act, 21 U.S.C. 315VVO-1(Y)(0), unless the authorization is terminated or revoked sooner. When diagnostic testing is negative, the possibility of a false negative result should be considered in the context of a patient's recent exposures and the presence of clinical signs and symptoms consistent with COVID-19. An individual without symptoms of COVID-19 and who is not shedding SARS-CoV-2 virus would expect to have a negative (not detected) result in this assay. Performed  At: [facility] [address] [place], [place] [id number] Rush Farmer MD SW:[id number]    Peck  Final    Comment: Performed at [facility], [place] [address], [place], [place] [address]    Studies/Results: Dg C-arm 1-60 Min-no Report  Result Date: 03/26/2019 Fluoroscopy was utilized by the requesting physician.  No radiographic interpretation.   Ir Ureteral Stent Left New Access W/o Sep Nephrostomy Cath  Result Date: 03/26/2019 INDICATION: Left-sided staghorn renal calculus and need for percutaneous access prior to operative nephrolithotomy. EXAM: PERCUTANEOUS LEFT RENAL ACCESS AND URETERAL CATHETER PLACEMENT UNDER FLUOROSCOPY COMPARISON:  CT of the abdomen and pelvis without contrast on 01/23/2019 MEDICATIONS: 400 mg IV Cipro; The antibiotic was administered in an appropriate time frame prior to skin puncture. ANESTHESIA/SEDATION: Fentanyl 100 mcg IV; Versed 3.0 mg IV Moderate Sedation Time:  26 minutes. The patient was continuously monitored during the procedure by the interventional radiology nurse under my direct supervision. CONTRAST:  15 mL Omnipaque 300-administered into the collecting system(s) FLUOROSCOPY  TIME:  Fluoroscopy Time: 9 minutes and 54 seconds. 422.1 mGy. COMPLICATIONS: None immediate. PROCEDURE: Informed written consent was obtained from the patient after a thorough  discussion of the procedural risks, benefits and alternatives. All questions were addressed. Maximal Sterile Barrier Technique was utilized including caps, mask, sterile gowns, sterile gloves, sterile drape, hand hygiene and skin antiseptic. A timeout was performed prior to the initiation of the procedure. The left flank region was prepped with chlorhexidine in a prone position. Initial 21 gauge needle pass was made under ultrasound guidance into the lower pole of the left kidney. Aspiration was performed through the needle and the needle removed. Under fluoroscopy, a lower pole calyx containing a calculus was targeted with a 21 gauge needle. Aspiration was performed followed by contrast injection. A guidewire was advanced into the collecting system. A transitional dilator was placed. Over a standard guidewire, a 5 French catheter was placed. The catheter was further directed into the proximal ureter over a hydrophilic wire. The catheter was then further advanced down the ureter and into the bladder. The catheter was capped and secured at the skin with a silk retention suture. FINDINGS: Ultrasound demonstrates no evidence of hydronephrosis. There is intense shadowing in the central collecting system due to the known staghorn calculus occupying the renal pelvis. With a single pass into the lower pole of the left kidney, there was inability to access the collecting system. A smaller calculus located in a lower pole calyx was targeted under fluoroscopy and accessed allowing injection of contrast into the collecting system. After obtaining access in the collecting system, catheter access was then able to be directed centrally and down the left ureter into the bladder. Five French ureteral catheter access will be utilized during operative percutaneous nephrolithotomy. IMPRESSION: Percutaneous left renal access at the level of a lower pole calyx containing a calculus with advancement of a 5 French catheter past the large  central staghorn calculus occupying the renal pelvis and down the ureter and into the bladder. This access will be utilized for percutaneous nephrolithotomy later today. Electronically Signed   By: Aletta Edouard M.D.   On: 03/26/2019 14:58    Assessment/Plan: POD#1 PCNL:  NPO for nephrostomy tube placement today   LOS: 0 days   [name] 03/28/2019, 12:39 PM

## 2019-03-28 NOTE — Procedures (Signed)
Interventional Radiology Procedure Note  Procedure: left PCN placement.  [name] .  Complications: None Recommendations:  - To gravity drain - Do not submerge - Routine care   Signed,  Dulcy Fanny. Earleen Newport, DO

## 2019-03-28 NOTE — Plan of Care (Signed)
  Problem: Health Behavior/Discharge Planning: Goal: Ability to manage health-related needs will improve Outcome: Progressing   Problem: Clinical Measurements: Goal: Will remain free from infection Outcome: Progressing   Problem: Activity: Goal: Risk for activity intolerance will decrease Outcome: Progressing   Problem: Nutrition: Goal: Adequate nutrition will be maintained Outcome: Progressing   Problem: Elimination: Goal: Will not experience complications related to bowel motility Outcome: Progressing

## 2019-03-29 ENCOUNTER — Encounter (HOSPITAL_COMMUNITY): Payer: Self-pay | Admitting: Interventional Radiology

## 2019-03-29 DIAGNOSIS — N200 Calculus of kidney: Secondary | ICD-10-CM | POA: Diagnosis not present

## 2019-03-29 MED ORDER — CEFUROXIME AXETIL 500 MG PO TABS: 500.0000 mg | ORAL_TABLET | Freq: Two times a day (BID) | ORAL | 0 refills | Status: AC

## 2019-03-29 NOTE — Discharge Instructions (Signed)
Percutaneous Nephrostomy, Care After  This sheet gives you information about how to care for yourself after your procedure. Your health care provider may also give you more specific instructions. If you have problems or questions, contact your health care provider.  What can I expect after the procedure?  After the procedure, it is common to have:  Some soreness where the nephrostomy tube was inserted (tube insertion site).  Blood-tinged drainage from the nephrostomy tube for the first 24 hours.  Follow these instructions at home:  Activity  Return to your normal activities as told by your health care provider. Ask your health care provider what activities are safe for you.  Avoid activities that may cause the nephrostomy tubing to bend.  Do not take baths, swim, or use a hot tub until your health care provider approves. Ask your health care provider if you can take showers. Cover the nephrostomy tube dressing with a watertight covering when you take a shower.  Do not drive for 24 hours if you were given a medicine to help you relax (sedative).  Care of the tube insertion site    Follow instructions from your health care provider about how to take care of your tube insertion site. Make sure you:  Wash your hands with soap and water before you change your bandage (dressing). If soap and water are not available, use hand sanitizer.  Change your dressing as told by your health care provider. Be careful not to pull on the tube while removing the dressing.  When you change the dressing, wash the skin around the tube, rinse well, and pat the skin dry.  Check the tube insertion area every day for signs of infection. Check for:  More redness, swelling, or pain.  More fluid or blood.  Warmth.  Pus or a bad smell.  Care of the nephrostomy tube and drainage bag  Always keep the tubing, the leg bag, or the bedside drainage bags below the level of the kidney so that your urine drains freely.  When connecting your nephrostomy  tube to a drainage bag, make sure that there are no kinks in the tubing and that your urine is draining freely. You may want to use an elastic bandage to wrap any exposed tubing that goes from the nephrostomy tube to any of the connecting tubes.  At night, you may want to connect your nephrostomy tube or the leg bag to a larger bedside drainage bag.  Follow instructions from your health care provider about how to empty or change the drainage bag.  Empty the drainage bag when it becomes ? full.  Replace the drainage bag and any extension tubing that is connected to your nephrostomy tube every 3 weeks or as often as told by your health care provider. Your health care provider will explain how to change the drainage bag and extension tubing.  General instructions  Take over-the-counter and prescription medicines only as told by your health care provider.  Keep all follow-up visits as told by your health care provider. This is important.  Contact a health care provider if:  You have problems with any of the valves or tubing.  You have persistent pain or soreness in your back.  You have more redness, swelling, or pain around your tube insertion site.  You have more fluid or blood coming from your tube insertion site.  Your tube insertion site feels warm to the touch.  You have pus or a bad smell coming from your tube   insertion site.  You have increased urine output or you feel burning when urinating.  Get help right away if:  You have pain in your abdomen during the first week.  You have chest pain or have trouble breathing.  You have a new appearance of blood in your urine.  You have a fever or chills.  You have back pain that is not relieved by your medicine.  You have decreased urine output.  Your nephrostomy tube comes out.  This information is not intended to replace advice given to you by your health care provider. Make sure you discuss any questions you have with your health care provider.  Document Released:  05/20/2004 Document Revised: 07/09/2016 Document Reviewed: 07/09/2016  [employer] Interactive Patient Education © 2019 [employer]

## 2019-03-29 NOTE — Discharge Summary (Signed)
Physician Discharge Summary  Patient ID: [name] MRN: [id number] DOB/AGE: 1946/01/24 73 y.o.  Admit date: 03/26/2019 Discharge date: 04/01/2019  Admission Diagnoses:  Renal calculus Discharge Diagnoses:  Active Problems:   H/O nephrolithotomy with removal of calculi   Renal calculus   Past Medical History:  Diagnosis Date  . A-fib (HCC)    hx.- sinus rhythm now  . Acid reflux   . AKI (acute kidney injury) (HCC)    mild  . Anxiety   . Arthritis   . Atrial dilatation, bilateral    Severly, enlargement  per ECHO 11/03/16  . BPH (benign prostatic hyperplasia)   . CAD (coronary artery disease)   . Cancer ([place])   . Depression   . Dislocation of internal right hip prosthesis, sequela   . Dysrhythmia    hx a fib  . Elevated brain natriuretic peptide (BNP) level   . Environmental allergies   . Fracture of wrist 10/23/13   LEFT  . High cholesterol   . History of Graves' disease   . History of kidney stones    surg. removal 02/2017  . History of syncope   . HLD (hyperlipidemia)   . Hypertension   . Hypothyroidism    thyroid removed  . Left knee injury    cap dislocated  . Low testosterone, possible hypogonadism 10/01/2012  . Lumbago   . Metabolic bone disease   . Multiple falls   . Osteoporosis    Severe  . Other abnormalities of gait and mobility   . Plaque psoriasis   . Sleep apnea    cpap machine-settings 17  . Thrombocytopenia ([place])   . Thyroid disease    HX GRAVES DISEASE  . Transfusion history    s/p 12'13 hip surgery  . Unsteady gait   . UTI (urinary tract infection)   . Vertebral compression fracture (HCC)    L1-wears brace    Surgeries: Procedure(s): NEPHROLITHOTOMY PERCUTANEOUS HOLMIUM LASER APPLICATION on 4/48/1856   Consultants (if any):   Discharged Condition: Improved  Hospital Course: [name] is an 73 y.o. male who was admitted 03/26/2019 with a diagnosis of renal calculus and went to the operating room on 03/26/2019 and  underwent the above named procedures.  He had a new nephrostomy tube placed after the procedure  He was given perioperative antibiotics:  Anti-infectives (From admission, onward)   Start     Dose/Rate Route Frequency Ordered Stop   03/29/19 0000  cefUROXime (CEFTIN) 500 MG tablet     500 mg Oral 2 times daily 03/29/19 0801 04/08/19 2359   03/27/19 1300  ceFAZolin (ANCEF) 3 g in dextrose 5 % 50 mL IVPB  Status:  Discontinued     3 g 100 mL/hr over 30 Minutes Intravenous To Radiology 03/27/19 1209 03/27/19 1210   03/27/19 1300  cefTRIAXone (ROCEPHIN) 1 g in sodium chloride 0.9 % 100 mL IVPB     1 g 200 mL/hr over 30 Minutes Intravenous  Once 03/27/19 1211 03/28/19 1700    .  He was given sequential compression devices, early ambulation, and scds for DVT prophylaxis.  He benefited maximally from the hospital stay and there were no complications.    Recent vital signs:  Vitals:   03/28/19 2149 03/29/19 0236  BP:  (!) 144/70  Pulse:  99  Resp: (!) 25 (!) 24  Temp:  99 F (37.2 C)  SpO2:  97%    Recent laboratory studies:  Lab Results  Component Value Date  HGB 12.3 (L) 03/27/2019   HGB 13.2 03/26/2019   HGB 11.9 (L) 03/19/2019   Lab Results  Component Value Date   WBC 9.9 03/27/2019   PLT 194 03/27/2019   Lab Results  Component Value Date   INR 1.0 03/26/2019   Lab Results  Component Value Date   NA 139 03/27/2019   K 4.7 03/27/2019   CL 104 03/27/2019   CO2 24 03/27/2019   BUN 25 (H) 03/27/2019   CREATININE 1.29 (H) 03/27/2019   GLUCOSE 109 (H) 03/27/2019    Discharge Medications:   Allergies as of 03/29/2019      Reactions   Short Ragweed Pollen Ext Cough      Medication List    TAKE these medications   acetaminophen 500 MG tablet Commonly known as: TYLENOL Take 1,000 mg by mouth daily as needed for moderate pain. Notes to patient: 03/29/19   albuterol (2.5 MG/3ML) 0.083% nebulizer solution Commonly known as: PROVENTIL Take 3 mLs (2.5 mg total) by  nebulization every 4 (four) hours as needed for wheezing or shortness of breath. Notes to patient: 03/29/19   alfuzosin 10 MG 24 hr tablet Commonly known as: UROXATRAL Take 10 mg by mouth at bedtime. Notes to patient: 03/29/19   atorvastatin 80 MG tablet Commonly known as: LIPITOR Take 1 tablet (80 mg total) by mouth daily. Notes to patient: 03/30/19   buPROPion 300 MG 24 hr tablet Commonly known as: WELLBUTRIN XL TAKE 1 TABLET DAILY WITH  BREAKFAST BY MOUTH. What changed: See the new instructions. Notes to patient: 03/30/19   CALCIUM CITRATE + D PO Take 1 tablet by mouth 2 (two) times daily. Notes to patient: 03/29/19   cefUROXime 500 MG tablet Commonly known as: CEFTIN Take 1 tablet (500 mg total) by mouth 2 (two) times daily for 10 days. Notes to patient: 03/29/19   clonazePAM 1 MG tablet Commonly known as: KLONOPIN Take 1 tablet (1 mg total) by mouth 3 (three) times daily as needed for anxiety. TAKE 1 TABLET BY MOUTH  EVERY 6 HOURS prn What changed:   when to take this  additional instructions Notes to patient: 03/29/19, afternoon & evening   diltiazem 240 MG 24 hr capsule Commonly known as: CARDIZEM CD TAKE 1 CAPSULE BY MOUTH  DAILY Notes to patient: 03/30/19   doxycycline 100 MG tablet Commonly known as: VIBRA-TABS Take 1 tablet (100 mg total) by mouth daily. Notes to patient: 03/29/19   DULoxetine 60 MG capsule Commonly known as: CYMBALTA TAKE 1 CAPSULE BY MOUTH  TWICE DAILY Notes to patient: 03/30/19   EpiPen 2-Pak 0.3 mg/0.3 mL Soaj injection Generic drug: EPINEPHrine Inject 0.3 mg into the muscle daily as needed (for anaphylactic reaction). Notes to patient: 03/29/19   ezetimibe 10 MG tablet Commonly known as: ZETIA Take 1 tablet (10 mg total) by mouth daily. Notes to patient: 03/30/19   ferrous sulfate 325 (65 FE) MG tablet Take 325 mg by mouth 2 (two) times daily with a meal. Notes to patient: 03/29/19   HYDROcodone-acetaminophen 7.5-325 MG  tablet Commonly known as: NORCO Take 1 tablet by mouth 4 (four) times daily as needed for moderate pain. Please fill on 04/06/19 Notes to patient: 03/29/19   HYDROcodone-acetaminophen 7.5-325 MG tablet Commonly known as: NORCO Take 1 tablet by mouth 4 (four) times daily as needed for moderate pain. Notes to patient: 03/29/19   HYDROcodone-acetaminophen 7.5-325 MG tablet Commonly known as: NORCO Take 1 tablet by mouth every 6 (six) hours as needed for  severe pain. Notes to patient: 03/29/19   levothyroxine 25 MCG tablet Commonly known as: SYNTHROID TAKE 1 TABLET BY MOUTH  DAILY BEFORE BREAKFAST. What changed: See the new instructions. Notes to patient: 03/30/19   levothyroxine 200 MCG tablet Commonly known as: SYNTHROID TAKE 1 TABLET DAILY AT 6 AM BY MOUTH. What changed: See the new instructions. Notes to patient: 03/30/19   loratadine 10 MG tablet Commonly known as: CLARITIN Take 10 mg by mouth daily. Notes to patient: 03/30/19   Magnesium 500 MG Caps Take 1 capsule (500 mg total) by mouth daily. Notes to patient: 03/29/19   metoprolol tartrate 25 MG tablet Commonly known as: LOPRESSOR Take 1 tablet (25 mg total) by mouth 2 (two) times daily. Notes to patient: 03/29/19, evening   multivitamin with minerals Tabs tablet Take 1 tablet by mouth daily. Men's One-A-Day 50+ Notes to patient: 03/29/19   Myrbetriq 25 MG Tb24 tablet Generic drug: mirabegron ER Take 25 mg by mouth at bedtime. Notes to patient: 03/29/19   nitroGLYCERIN 0.4 MG SL tablet Commonly known as: NITROSTAT Place 0.4 mg under the tongue every 5 (five) minutes as needed for chest pain. x3 doses as needed for chest pain Notes to patient: 03/29/19   omeprazole 40 MG capsule Commonly known as: PRILOSEC TAKE 1 CAPSULE DAILY BEFORE BREAKFAST BY MOUTH. What changed: See the new instructions. Notes to patient: 03/30/19   ondansetron 4 MG tablet Commonly known as: ZOFRAN Take 1 tablet (4 mg total) by mouth every 6  (six) hours as needed for nausea. Notes to patient: 03/29/19   Otezla 30 MG Tabs Generic drug: Apremilast Take 30 mg by mouth 2 (two) times a day.   PROBIOTIC PO Take 1 capsule by mouth daily with breakfast. Notes to patient: 03/30/19   prochlorperazine 10 MG tablet Commonly known as: COMPAZINE Take 1 tablet (10 mg total) by mouth every 6 (six) hours as needed (Nausea or vomiting). Notes to patient: 03/29/19   Prolia 60 MG/ML Sosy injection Generic drug: denosumab Inject 60 mg as directed every 6 (six) months. Last injection: Feb 2020 Next injection: August 2020 Notes to patient: Resume at home schedule   testosterone enanthate 200 MG/ML injection Commonly known as: DELATESTRYL INJECT 1ML (200MG ) INTO THE MUSCLE EVERY 14 DAYS. DISCARD VIAL AFTER 28 DAYS. What changed: See the new instructions. Notes to patient: Resume at home schedule   tiZANidine 4 MG tablet Commonly known as: ZANAFLEX TAKE 1 TABLET BY MOUTH  EVERY 6 HOURS AS NEEDED FOR MUSCLE SPASM(S) What changed: See the new instructions. Notes to patient: 03/29/19, 1500   vitamin C 500 MG tablet Commonly known as: ASCORBIC ACID Take 500 mg by mouth daily. Notes to patient: 03/29/19   VITAMIN D-3 PO Take 2,000 Units by mouth 2 (two) times daily.   Xarelto 10 MG Tabs tablet Generic drug: rivaroxaban TAKE 1 TABLET DAILY BY  MOUTH. What changed: how much to take Notes to patient: 03/29/19       Diagnostic Studies: Dg C-arm 1-60 Min-no Report  Result Date: 03/26/2019 Fluoroscopy was utilized by the requesting physician.  No radiographic interpretation.   Ir Nephrostomy Placement Left  Result Date: 03/29/2019 INDICATION: 72 year old male with a history nephrolithiasis on the left. He has been referred for percutaneous access. EXAM: IR NEPHROSTOMY PLACEMENT LEFT COMPARISON:  03/26/2019, CT 01/23/2019 MEDICATIONS: 1 g Rocephin; The antibiotic was administered in an appropriate time frame prior to skin puncture.  ANESTHESIA/SEDATION: Fentanyl 6 mcg IV; Versed 200 mg IV Moderate Sedation Time:  44 minutes The patient was continuously monitored during the procedure by the interventional radiology nurse under my direct supervision. CONTRAST:  40 cc-administered into the collecting system(s) FLUOROSCOPY TIME:  Fluoroscopy Time: 14 minutes 30 seconds (676 mGy). COMPLICATIONS: None PROCEDURE: Informed written consent was obtained from the patient after a thorough discussion of the procedural risks, benefits and alternatives. All questions were addressed. Maximal Sterile Barrier Technique was utilized including caps, mask, sterile gowns, sterile gloves, sterile drape, hand hygiene and skin antiseptic. A timeout was performed prior to the initiation of the procedure. Patient positioned prone position on the fluoroscopy table. Ultrasound survey of the left flank was performed with images stored and sent to PACs. The patient was then prepped and draped in the usual sterile fashion. 1% lidocaine was used to anesthetize the skin and subcutaneous tissues for local anesthesia. A Chiba needle was then used to access the central collecting system from from [place] approach targeting calcium in the collecting system. Once the needle was in position, contrast injected confirmed location within the renal pelvis. A second needle was then used to target and inferior posterior calyx with an oblique position of the image intensifier. Needle tip was confirmed with injection of contrast at the second needle site. Passage of an 018 micro wire into the collecting system was performed under fluoroscopy. A small incision was made with an 11 blade scalpel, and the needle was removed from the wire. An Accustick system was then advanced over the wire into the collecting system under fluoroscopy. The metal stiffener and inner dilator were removed, and then a sample of fluid was aspirated through the 4 French outer sheath. Bentson wire was passed into the  collecting system and the sheath removed. Ten French dilation of the soft tissues was performed. At this point the first needle puncture was abandoned. The initial 10 French standard pigtail drain catheter would not formed in the decompressed collecting system. Various techniques were used included in exchange of wire for a stiff Amplatz wire, the metal stiffener, and attempted replacement. This catheter became damaged and we again attempted a second 10 French drain catheter. Ultimately we abandoned this size catheter and placed a [place]. Wire and inner stiffener removed, and the pigtail was formed in the collecting system. Small amount of contrast confirmed position of the catheter. Patient tolerated the procedure well and remained hemodynamically stable throughout. No complications were encountered and no significant blood loss encountered IMPRESSION: Status post placement of left-sided percutaneous nephrostomy. Signed, Dulcy Fanny. Dellia Nims, RPVI [facility] Electronically Signed   By: Corrie Mckusick D.O.   On: 03/29/2019 07:12   Ir Ureteral Stent Left New Access W/o Sep Nephrostomy Cath  Result Date: 03/26/2019 INDICATION: Left-sided staghorn renal calculus and need for percutaneous access prior to operative nephrolithotomy. EXAM: PERCUTANEOUS LEFT RENAL ACCESS AND URETERAL CATHETER PLACEMENT UNDER FLUOROSCOPY COMPARISON:  CT of the abdomen and pelvis without contrast on 01/23/2019 MEDICATIONS: 400 mg IV Cipro; The antibiotic was administered in an appropriate time frame prior to skin puncture. ANESTHESIA/SEDATION: Fentanyl 100 mcg IV; Versed 3.0 mg IV Moderate Sedation Time:  26 minutes. The patient was continuously monitored during the procedure by the interventional radiology nurse under my direct supervision. CONTRAST:  15 mL Omnipaque 300-administered into the collecting system(s) FLUOROSCOPY TIME:  Fluoroscopy Time: 9 minutes and  54 seconds. 422.1 mGy. COMPLICATIONS: None immediate. PROCEDURE: Informed written consent was obtained from the patient after a thorough discussion of the procedural risks,  benefits and alternatives. All questions were addressed. Maximal Sterile Barrier Technique was utilized including caps, mask, sterile gowns, sterile gloves, sterile drape, hand hygiene and skin antiseptic. A timeout was performed prior to the initiation of the procedure. The left flank region was prepped with chlorhexidine in a prone position. Initial 21 gauge needle pass was made under ultrasound guidance into the lower pole of the left kidney. Aspiration was performed through the needle and the needle removed. Under fluoroscopy, a lower pole calyx containing a calculus was targeted with a 21 gauge needle. Aspiration was performed followed by contrast injection. A guidewire was advanced into the collecting system. A transitional dilator was placed. Over a standard guidewire, a 5 French catheter was placed. The catheter was further directed into the proximal ureter over a hydrophilic wire. The catheter was then further advanced down the ureter and into the bladder. The catheter was capped and secured at the skin with a silk retention suture. FINDINGS: Ultrasound demonstrates no evidence of hydronephrosis. There is intense shadowing in the central collecting system due to the known staghorn calculus occupying the renal pelvis. With a single pass into the lower pole of the left kidney, there was inability to access the collecting system. A smaller calculus located in a lower pole calyx was targeted under fluoroscopy and accessed allowing injection of contrast into the collecting system. After obtaining access in the collecting system, catheter access was then able to be directed centrally and down the left ureter into the bladder. Five French ureteral catheter access will be utilized during operative percutaneous nephrolithotomy. IMPRESSION:  Percutaneous left renal access at the level of a lower pole calyx containing a calculus with advancement of a 5 French catheter past the large central staghorn calculus occupying the renal pelvis and down the ureter and into the bladder. This access will be utilized for percutaneous nephrolithotomy later today. Electronically Signed   By: Aletta Edouard M.D.   On: 03/26/2019 14:58    Disposition:     Follow-up Information    Chandell Attridge, Candee Furbish, MD. Call in 2 week(s).   Specialty: Urology Contact information: [address] [place] [address] [phone]            Signed: [name] 04/01/2019, 11:22 AM

## 2019-04-01 NOTE — Op Note (Signed)
Preoperative diagnosis: Left renal stone  Postoperative diagnosis: Same  Procedure 1.  Left percutaneous nephrostolithotomy for stone less than 2 cm 2.  Left nephrostogram 3.  Intraoperative fluoroscopy, under 1 hour, with interpretation 4. Dilation of percutaneous tract  Attending: Dr. Alyson Ingles  Anesthesia: General  Estimated blood loss: 100cc  Antibiotics: rocephin  Drains: 1.  37 French Foley catheter   Specimens: Stone for analysis  Findings: complete staghorn calculus. Access was unable to be used due compression of the tract by the calculus.  Indications: Patient is a 73 year old male with a history of large left renal stones.  After discussing treatment options and decided she was left percutaneous nephrostolithotomy.  Patient already has a nephrostomy tube.  Procedure in detail: Prior to procedure consent was obtained.  Patient was brought to the operating room debridement was done to ensure correct patient, correct procedure, and correct site.  General anesthesia was administered.  A 16 French Foley catheter was in place.  The patient was then placed in the prone position.  His nephrostomy tube and left flank was then prepped and draped in usual sterile fashion.  A nephrostogram was obtained and findings noted above.  Through the nephrostomy tube we then placed a sensor wire.  Sensor wire was coiled in the renal pelvis we then removed the nephrostomy tube.  We then made an incision at the level of the skin and over the wire we then placed a NephroMax dilator.  We dilated the nephrostomy tract to 30 [place] and held this 18 cm of water for 1 minute.  We then  placed the access sheath over the balloon.  The balloon was then deflated.  We then used a flexible ureteroscope to perform nephroscopy. We used a 200nm laser fiber to fragment the stone in an attempt to place a wire down the left ureter. This was unsuccessful due to bleeding. We then elected to place a nephrostomy tube. We then  placed a 14 French nephrostomy tube through the sheath into the renal pelvis.  The nephrostmy tube coiled. We then removed the access sheath in and obtain another nephrostogram.  We noted the tube to no longer be in the kidney.  We then removed the nephrostomy tube and terminated the procedure. This then concluded the procedure was well-tolerated by the patient.  Complications: None  Condition: Stable, extubated, transferred to PACU  Plan: Patient is to be admitted overnight for observation.  His Foley catheter through the morning. He will have another nephrostomy tube placed by interventional radiology proior to discharge  He is then to be discharged home and followup in 2 weeks for repeat PCNL

## 2019-04-02 ENCOUNTER — Other Ambulatory Visit: Payer: Self-pay | Admitting: Urology

## 2019-04-09 NOTE — Patient Instructions (Addendum)
YOU NEED TO HAVE A COVID 19 TEST ON__Friday 7/3_____ @_11 :20______, THIS TEST MUST BE DONE BEFORE SURGERY, COME TO [facility] ENTRANCE. ONCE YOUR COVID TEST IS COMPLETED, PLEASE BEGIN THE QUARANTINE INSTRUCTIONS AS OUTLINED IN YOUR HANDOUT.                KAESON KLEINERT     Your procedure is scheduled on: 04-17-2019   Report to [facility] Main  Entrance Report to admitting at 10:00 AM      Call this number if you have problems the morning of surgery [phone]     Remember: Do not eat food or drink liquids :After Midnight. BRUSH YOUR TEETH MORNING OF SURGERY AND RINSE YOUR MOUTH OUT, NO CHEWING GUM CANDY OR MINTS.     Take these medicines the morning of surgery with A SIP OF WATER: [place] Nail, Klonopin, welbutrin,Cymbalta, Lopressor, Cardiazem                               You may not have any metal on your body including piercings                     Bring mask and tubing to hospital              Do not wear jewelry, , lotions, powders or deodorant                       Men may shave face and neck.                                                                Bring inhaler   Do not bring valuables to the hospital. [place].  Contacts, dentures or bridgework may not be worn into surgery.    _____________________________________________________________________             [facility] - Preparing for Surgery  Before surgery, you can play an important role.   Because skin is not sterile, your skin needs to be as free of germs as possible .  You can reduce the number of germs on your skin by washing with CHG (chlorahexidine gluconate) soap before surgery.   CHG is an antiseptic cleaner which kills germs and bonds with the skin to continue killing germs even after washing. Please DO NOT use if you have an allergy to CHG or antibacterial soaps.   If your skin becomes reddened/irritated stop  using the CHG and inform your nurse when you arrive at Short Stay.   You may shave your face/neck. Please follow these instructions carefully:   1.  Shower with CHG Soap the night before surgery and the  morning of Surgery.  2.  If you choose to wash your hair, wash your hair first as usual with your  normal  shampoo.  3.  After you shampoo, rinse your hair and body thoroughly to remove the  shampoo.  4.  Use CHG as you would any other liquid soap.  You can apply chg directly  to the skin and wash                       Gently with a scrungie or clean washcloth.  5.  Apply the CHG Soap to your body ONLY FROM THE NECK DOWN.   Do not use on face/ open                           Wound or open sores. Avoid contact with eyes, ears mouth and genitals (private parts).                       Wash face,  Genitals (private parts) with your normal soap.             6.  Wash thoroughly, paying special attention to the area where your surgery  will be performed.  7.  Thoroughly rinse your body with warm water from the neck down.  8.  DO NOT shower/wash with your normal soap after using and rinsing off  the CHG Soap.             9.  Pat yourself dry with a clean towel.            10.  Wear clean pajamas.            11.  Place clean sheets on your bed the night of your first shower and do not  sleep with pets.  Day of Surgery : Do not apply any lotions/deodorants the morning of surgery.  Please wear clean clothes to the hospital/surgery center.   FAILURE TO FOLLOW THESE INSTRUCTIONS MAY RESULT IN THE CANCELLATION OF YOUR SURGERY PATIENT SIGNATURE_________________________________  NURSE SIGNATURE__________________________________  ________________________________________________________________________

## 2019-04-10 ENCOUNTER — Other Ambulatory Visit: Payer: Self-pay

## 2019-04-10 ENCOUNTER — Encounter (HOSPITAL_COMMUNITY): Payer: Self-pay

## 2019-04-10 ENCOUNTER — Encounter (HOSPITAL_COMMUNITY)
Admission: RE | Admit: 2019-04-10 | Discharge: 2019-04-10 | Disposition: A | Discharge status: Home / Self Care | Payer: Medicare Other | Source: Ambulatory Visit | Attending: Urology | Admitting: Urology

## 2019-04-10 DIAGNOSIS — I4891 Unspecified atrial fibrillation: Secondary | ICD-10-CM | POA: Diagnosis not present

## 2019-04-10 DIAGNOSIS — E039 Hypothyroidism, unspecified: Secondary | ICD-10-CM | POA: Diagnosis not present

## 2019-04-10 DIAGNOSIS — E0500 Thyrotoxicosis with diffuse goiter without thyrotoxic crisis or storm: Secondary | ICD-10-CM | POA: Insufficient documentation

## 2019-04-10 DIAGNOSIS — I10 Essential (primary) hypertension: Secondary | ICD-10-CM | POA: Diagnosis not present

## 2019-04-10 DIAGNOSIS — E785 Hyperlipidemia, unspecified: Secondary | ICD-10-CM | POA: Insufficient documentation

## 2019-04-10 DIAGNOSIS — N400 Enlarged prostate without lower urinary tract symptoms: Secondary | ICD-10-CM | POA: Insufficient documentation

## 2019-04-10 DIAGNOSIS — N200 Calculus of kidney: Secondary | ICD-10-CM | POA: Diagnosis not present

## 2019-04-10 DIAGNOSIS — G4730 Sleep apnea, unspecified: Secondary | ICD-10-CM | POA: Diagnosis not present

## 2019-04-10 DIAGNOSIS — Z01812 Encounter for preprocedural laboratory examination: Secondary | ICD-10-CM | POA: Insufficient documentation

## 2019-04-10 DIAGNOSIS — Z79890 Hormone replacement therapy (postmenopausal): Secondary | ICD-10-CM | POA: Insufficient documentation

## 2019-04-10 DIAGNOSIS — C8330 Diffuse large B-cell lymphoma, unspecified site: Secondary | ICD-10-CM | POA: Insufficient documentation

## 2019-04-10 DIAGNOSIS — I2510 Atherosclerotic heart disease of native coronary artery without angina pectoris: Secondary | ICD-10-CM | POA: Diagnosis not present

## 2019-04-10 DIAGNOSIS — Z7901 Long term (current) use of anticoagulants: Secondary | ICD-10-CM | POA: Insufficient documentation

## 2019-04-10 DIAGNOSIS — Z1159 Encounter for screening for other viral diseases: Secondary | ICD-10-CM | POA: Insufficient documentation

## 2019-04-10 DIAGNOSIS — Z87891 Personal history of nicotine dependence: Secondary | ICD-10-CM | POA: Insufficient documentation

## 2019-04-10 DIAGNOSIS — Z79899 Other long term (current) drug therapy: Secondary | ICD-10-CM | POA: Insufficient documentation

## 2019-04-10 DIAGNOSIS — K219 Gastro-esophageal reflux disease without esophagitis: Secondary | ICD-10-CM | POA: Insufficient documentation

## 2019-04-10 DIAGNOSIS — J449 Chronic obstructive pulmonary disease, unspecified: Secondary | ICD-10-CM | POA: Insufficient documentation

## 2019-04-10 LAB — BASIC METABOLIC PANEL
Anion gap: 8 (ref 5–15)
BUN: 32 mg/dL — ABNORMAL HIGH (ref 8–23)
CO2: 26 mmol/L (ref 22–32)
Calcium: 8.8 mg/dL — ABNORMAL LOW (ref 8.9–10.3)
Chloride: 105 mmol/L (ref 98–111)
Creatinine, Ser: 1.11 mg/dL (ref 0.61–1.24)
GFR calc Af Amer: >60 mL/min (ref >60)
GFR calc non Af Amer: >60 mL/min (ref >60)
Glucose, Bld: 118 mg/dL — ABNORMAL HIGH (ref 70–99)
Potassium: 4.6 mmol/L (ref 3.5–5.1)
Sodium: 139 mmol/L (ref 135–145)

## 2019-04-10 LAB — CBC
HCT: 38.2 % — ABNORMAL LOW (ref 39.0–52.0)
Hemoglobin: 12 g/dL — ABNORMAL LOW (ref 13.0–17.0)
MCH: 30.2 pg (ref 26.0–34.0)
MCHC: 31.4 g/dL (ref 30.0–36.0)
MCV: 96 fL (ref 80.0–100.0)
Platelets: 225 10*3/uL (ref 150–400)
RBC: 3.98 MIL/uL — ABNORMAL LOW (ref 4.22–5.81)
RDW: 15.3 % (ref 11.5–15.5)
WBC: 5.7 10*3/uL (ref 4.0–10.5)
nRBC: 0 % (ref 0.0–0.2)

## 2019-04-10 NOTE — Progress Notes (Signed)
Konrad Felix PA   Dr. Oval Linsey instructed Patient to stop his Xarelto 04/13/19. He is the Pt's cardiologist.  Dr. Alain Marion is his PCP. Pt reports that he is always in and out of A-fib. EKG in Epic 11/08/18 shows A-fib.

## 2019-04-11 NOTE — Progress Notes (Signed)
Anesthesia Chart Review   Case: [id number] Date/Time: 04/17/19 1145   Procedures:      NEPHROLITHOTOMY PERCUTANEOUS (Left ) - 2 HRS     HOLMIUM LASER APPLICATION (Left )   Anesthesia type: General   Pre-op diagnosis: LEFT RENAL CALCULUS   Location: [facility]   Surgeon: Cleon Gustin, MD      DISCUSSION:72 y.o. former smoker (quit 06/05/12) with h/o GERD, depression, anxiety, A-fib (on Xarelto), HTN, sleep apnea w/cpap, COPD, Graves disease, CAD (MI), HLD, thrombocytopenia, hypothyroidism s/p thyroidectomy, diffuse large B-cell lymphoma on chemo, BPH, left renal calculus scheduled for above procedure 04/17/19 with Dr. [name].    Pt last seen by PCP, Dr. Lew Dawes, 03/07/2019 via telemedicine, stable at this visit.  Clearance from Dr. Alain Marion on chart which states pt is ok to stop Xarelto 3 days prior to procedure, ok to remain on Aspirin per Dr. Alyson Ingles.  Pt reports he has been advised to take last dose of Xarelto 04/13/2019 per Dr. Oval Linsey.   Last seen by cardiologist, Dr. Skeet Latch, 08/17/18.  Stable at this visit.   S/p percutaneous nephrolithotomy 03/26/2019 with no anesthesia complications noted.   Anticipate pt can proceed with planned procedure barring acute status change.   VS: BP (!) 132/47   Pulse 97   Temp 36.8 C   Ht 6\' 3"  (1.905 m)   Wt 132.5 kg   BMI 36.50 kg/m   PROVIDERS: Plotnikov, Evie Lacks, MD is PCP   Skeet Latch, MD is Cardiologist  LABS: Labs reviewed: Acceptable for surgery. (all labs ordered are listed, but only abnormal results are displayed)  Labs Reviewed  BASIC METABOLIC PANEL - Abnormal; Notable for the following components:      Result Value   Glucose, Bld 118 (*)    BUN 32 (*)    Calcium 8.8 (*)    All other components within normal limits  CBC - Abnormal; Notable for the following components:   RBC 3.98 (*)    Hemoglobin 12.0 (*)    HCT 38.2 (*)    All other components within normal  limits     IMAGES:   EKG: 11/08/2018 Rate 83 bpm Atrial fibrillation  Ventricular premature complex LAD, consider left anterior fascicular block  Low voltage, precordial leads No significant change was found  CV: Echo 06/14/2018 Study Conclusions  - Left ventricle: Apical hypokinesis abnormal GLS -13.9. The cavity   size was normal. Wall thickness was normal. Systolic function was   normal. The estimated ejection fraction was in the range of 50%   to 55%. Left ventricular diastolic function parameters were   normal. - Mitral valve: There was mild regurgitation. - Left atrium: The atrium was moderately dilated. - Atrial septum: No defect or patent foramen ovale was identified. Past Medical History:  Diagnosis Date  . A-fib (HCC)    hx.- sinus rhythm now  . Acid reflux   . AKI (acute kidney injury) (HCC)    mild  . Anxiety   . Arthritis   . Atrial dilatation, bilateral    Severly, enlargement  per ECHO 11/03/16  . BPH (benign prostatic hyperplasia)   . CAD (coronary artery disease)   . Cancer ([place])   . Depression   . Dislocation of internal right hip prosthesis, sequela   . Dysrhythmia    hx a fib  . Elevated brain natriuretic peptide (BNP) level   . Environmental allergies   . Fracture of wrist 10/23/13  LEFT  . High cholesterol   . History of Graves' disease   . History of kidney stones    surg. removal 02/2017  . History of syncope   . HLD (hyperlipidemia)   . Hypertension   . Hypothyroidism    thyroid removed  . Left knee injury    cap dislocated  . Low testosterone, possible hypogonadism 10/01/2012  . Lumbago   . Metabolic bone disease   . Multiple falls   . Osteoporosis    Severe  . Other abnormalities of gait and mobility   . Plaque psoriasis   . Sleep apnea    cpap machine-settings 17  . Thrombocytopenia ([place])   . Thyroid disease    HX GRAVES DISEASE  . Transfusion history    s/p 12'13 hip surgery  . Unsteady gait   . UTI (urinary tract  infection)   . Vertebral compression fracture (HCC)    L1-wears brace    Past Surgical History:  Procedure Laterality Date  . APPENDECTOMY    . CARDIAC CATHETERIZATION  2013  . CATARACT EXTRACTION, BILATERAL    . COLONOSCOPY    . CYSTOSCOPY WITH RETROGRADE PYELOGRAM, URETEROSCOPY AND STENT PLACEMENT Left 11/07/2017   Procedure: CYSTOSCOPY WITH RETROGRADE PYELOGRAM, URETEROSCOPY AND STENT PLACEMENT;  Surgeon: Cleon Gustin, MD;  [facility];  Service: Urology;  Laterality: Left;  . CYSTOSCOPY/URETEROSCOPY/HOLMIUM LASER/STENT PLACEMENT Right 03/11/2017   Procedure: CYSTOSCOPY/URETEROSCOPYSTENT PLACEMENT right ureter retrograde pylegram;  Surgeon: Cleon Gustin, MD;  [facility];  Service: Urology;  Laterality: Right;  . HARDWARE REMOVAL  10/05/2012   Procedure: HARDWARE REMOVAL;  Surgeon: [name], MD;  [facility];  Service: Orthopedics;  Laterality: Right;  REMOVING  STRYKER  GAMMA NAIL  . HARDWARE REMOVAL Right 07/03/2013   Procedure: HARDWARE REMOVAL RIGHT TIBIA ;  Surgeon: Rozanna Box, MD;  Location: [place];  Service: Orthopedics;  Laterality: Right;  . HIP CLOSED REDUCTION Right 10/15/2013   Procedure: CLOSED MANIPULATION HIP;  Surgeon: [name], MD;  [facility];  Service: Orthopedics;  Laterality: Right;  . hip revision     June 2017  . HOLMIUM LASER APPLICATION Right 10/14/4816   Procedure: HOLMIUM LASER APPLICATION;  Surgeon: Cleon Gustin, MD;  [facility];  Service: Urology;  Laterality: Right;  . HOLMIUM LASER APPLICATION Left 5/63/1497   Procedure: HOLMIUM LASER APPLICATION;  Surgeon: Cleon Gustin, MD;  [facility];  Service: Urology;  Laterality: Left;  . INCISION AND DRAINAGE HIP Right 11/16/2013   Procedure: IRRIGATION AND DEBRIDEMENT RIGHT HIP;  Surgeon: [name], MD;  [facility];  Service: Orthopedics;  Laterality: Right;  . IR IMAGING GUIDED PORT INSERTION  06/22/2018  . IR NEPHROSTOMY PLACEMENT LEFT   03/28/2019  . IR URETERAL STENT LEFT NEW ACCESS W/O SEP NEPHROSTOMY CATH  10/24/2017  . IR URETERAL STENT LEFT NEW ACCESS W/O SEP NEPHROSTOMY CATH  03/26/2019  . JOINT REPLACEMENT     hip-right x2  . KNEE SURGERY Bilateral 2012   Total knee replacements  . NEPHROLITHOTOMY Right 02/08/2017   Procedure: NEPHROLITHOTOMY PERCUTANEOUS WITH SURGEON ACCESS;  Surgeon: Cleon Gustin, MD;  [facility];  Service: Urology;  Laterality: Right;  . NEPHROLITHOTOMY Left 10/24/2017   Procedure: NEPHROLITHOTOMY PERCUTANEOUS;  Surgeon: Cleon Gustin, MD;  [facility];  Service: Urology;  Laterality: Left;  . NEPHROLITHOTOMY Left 03/26/2019   Procedure: NEPHROLITHOTOMY PERCUTANEOUS;  Surgeon: Cleon Gustin, MD;  [facility];  Service: Urology;  Laterality: Left;  2 HRS  . ORIF TIBIA FRACTURE Right 02/06/2013   Procedure: OPEN REDUCTION INTERNAL FIXATION (ORIF) TIBIA FRACTURE WITH IM ROD FIBULA;  Surgeon: Rozanna Box, MD;  Location: [place];  Service: Orthopedics;  Laterality: Right;  . ORIF TIBIA FRACTURE Right 07/03/2013   Procedure: RIGHT TIBIA NON UNION REPAIR ;  Surgeon: Rozanna Box, MD;  Location: [employer];  Service: Orthopedics;  Laterality: Right;  . ORIF WRIST FRACTURE  10/02/2012   Procedure: OPEN REDUCTION INTERNAL FIXATION (ORIF) WRIST FRACTURE;  Surgeon: Roseanne Kaufman, MD;  [facility];  Service: Orthopedics;  Laterality: Right;  WITH   ANTIBIOTIC  CEMENT  . ORIF WRIST FRACTURE Left 10/28/2013   Procedure: OPEN REDUCTION INTERNAL FIXATION (ORIF) WRIST FRACTURE with allograft;  Surgeon: Roseanne Kaufman, MD;  [facility];  Service: Orthopedics;  Laterality: Left;  DVR Plate  . QUADRICEPS TENDON REPAIR Left 07/15/2017   Procedure: REPAIR QUADRICEP TENDON;  Surgeon: Frederik Pear, MD;  Location: [place];  Service: Orthopedics;  Laterality: Left;  . right femur surgery  05/2012  . THYROIDECTOMY  02/1986  . TOTAL HIP REVISION  10/05/2012   Procedure: TOTAL HIP REVISION;   Surgeon: [name], MD;  [facility];  Service: Orthopedics;  Laterality: Right;  RIGHT TOTAL HIP REVISION  . TOTAL HIP REVISION Right 09/17/2013   Procedure: REVISION RIGHT TOTAL HIP ARTHROPLASTY ;  Surgeon: [name], MD;  [facility];  Service: Orthopedics;  Laterality: Right;  . TOTAL HIP REVISION Right 10/26/2013   Procedure: REVISION RIGHT TOTAL HIP ARTHROPLASTY;  Surgeon: [name], MD;  [facility];  Service: Orthopedics;  Laterality: Right;  . TOTAL HIP REVISION  03/2016  . TOTAL KNEE REVISION Left 04/11/2017   Procedure: TOTAL KNEE REVISION PATELLA and TIBIA;  Surgeon: Frederik Pear, MD;  Location: [place];  Service: Orthopedics;  Laterality: Left;  . WRIST FRACTURE SURGERY  05/2012    MEDICATIONS: . acetaminophen (TYLENOL) 500 MG tablet  . albuterol (PROVENTIL) (2.5 MG/3ML) 0.083% nebulizer solution  . alfuzosin (UROXATRAL) 10 MG 24 hr tablet  . atorvastatin (LIPITOR) 80 MG tablet  . buPROPion (WELLBUTRIN XL) 300 MG 24 hr tablet  . Calcium Citrate-Vitamin D (CALCIUM CITRATE + D PO)  . Cholecalciferol (VITAMIN D-3 PO)  . clonazePAM (KLONOPIN) 1 MG tablet  . denosumab (PROLIA) 60 MG/ML SOSY injection  . diltiazem (CARDIZEM CD) 240 MG 24 hr capsule  . doxycycline (VIBRA-TABS) 100 MG tablet  . DULoxetine (CYMBALTA) 60 MG capsule  . EPINEPHrine (EPIPEN 2-PAK) 0.3 mg/0.3 mL IJ SOAJ injection  . ezetimibe (ZETIA) 10 MG tablet  . ferrous sulfate 325 (65 FE) MG tablet  . HYDROcodone-acetaminophen (NORCO) 7.5-325 MG tablet  . HYDROcodone-acetaminophen (NORCO) 7.5-325 MG tablet  . HYDROcodone-acetaminophen (NORCO) 7.5-325 MG tablet  . levothyroxine (SYNTHROID, LEVOTHROID) 200 MCG tablet  . levothyroxine (SYNTHROID, LEVOTHROID) 25 MCG tablet  . loratadine (CLARITIN) 10 MG tablet  . Magnesium 500 MG CAPS  . metoprolol tartrate (LOPRESSOR) 25 MG tablet  . mirabegron ER (MYRBETRIQ) 25 MG TB24 tablet  . Multiple Vitamin (MULTIVITAMIN WITH MINERALS) TABS tablet  .  nitroGLYCERIN (NITROSTAT) 0.4 MG SL tablet  . omeprazole (PRILOSEC) 40 MG capsule  . ondansetron (ZOFRAN) 4 MG tablet  . OTEZLA 30 MG TABS  . Probiotic Product (PROBIOTIC PO)  . prochlorperazine (COMPAZINE) 10 MG tablet  . testosterone enanthate (DELATESTRYL) 200 MG/ML injection  . tiZANidine (ZANAFLEX) 4 MG tablet  . vitamin C (  ASCORBIC ACID) 500 MG tablet  . XARELTO 10 MG TABS tablet   No current facility-administered medications for this encounter.       Maia Plan [facility] Pre-Surgical Testing [phone] 04/11/19  12:29 PM

## 2019-04-11 NOTE — Anesthesia Preprocedure Evaluation (Addendum)
Anesthesia Evaluation  Patient identified by MRN, date of birth, ID band Patient awake    Reviewed: Allergy & Precautions, NPO status , Patient's Chart, lab work & pertinent test results, reviewed documented beta blocker date and time   Airway Mallampati: I  TM Distance: >3 FB Neck ROM: Full    Dental no notable dental hx. (+) Teeth Intact, Dental Advisory Given   Pulmonary sleep apnea and Continuous Positive Airway Pressure Ventilation , COPD,  COPD inhaler, former smoker,    Pulmonary exam normal breath sounds clear to auscultation       Cardiovascular hypertension, Pt. on home beta blockers and Pt. on medications + CAD and + Past MI  Normal cardiovascular exam+ dysrhythmias Atrial Fibrillation  Rhythm:Regular Rate:Normal  EKG: 11/08/2018 Rate 83 bpm Atrial fibrillation  Ventricular premature complex LAD, consider left anterior fascicular block  Low voltage, precordial leads No significant change was found  Echo 06/14/2018 Study Conclusions - Left ventricle: Apical hypokinesis abnormal GLS -13.9. The cavitysize was normal. Wall thickness was normal. Systolic function was normal. The estimated ejection fraction was in the range of 50%to 55%. Left ventricular diastolic function parameters were normal. - Mitral valve: There was mild regurgitation. - Left atrium: The atrium was moderately dilated. - Atrial septum: No defect or patent foramen ovale was identified.   Neuro/Psych PSYCHIATRIC DISORDERS Anxiety Depression negative neurological ROS     GI/Hepatic Neg liver ROS, GERD  ,  Endo/Other  Hypothyroidism (Graves disease s/p thyroidectomy)   Renal/GU negative Renal ROS  negative genitourinary   Musculoskeletal negative musculoskeletal ROS (+)   Abdominal   Peds  Hematology  (+) Blood dyscrasia (on xarelto), , Diffuse large B cell lymphoma on chemo   Anesthesia Other Findings Left renal calculus   Reproductive/Obstetrics                           Anesthesia Physical Anesthesia Plan  ASA: III  Anesthesia Plan: General   Post-op Pain Management:    Induction: Intravenous  PONV Risk Score and Plan: 2 and Dexamethasone and Ondansetron  Airway Management Planned: Oral ETT  Additional Equipment:   Intra-op Plan:   Post-operative Plan: Extubation in OR  Informed Consent: I have reviewed the patients History and Physical, chart, labs and discussed the procedure including the risks, benefits and alternatives for the proposed anesthesia with the patient or authorized representative who has indicated his/her understanding and acceptance.     Dental advisory given  Plan Discussed with: CRNA  Anesthesia Plan Comments:        Anesthesia Quick Evaluation

## 2019-04-13 ENCOUNTER — Other Ambulatory Visit (HOSPITAL_COMMUNITY)
Admission: RE | Admit: 2019-04-13 | Discharge: 2019-04-13 | Disposition: A | Discharge status: Home / Self Care | Payer: Medicare Other | Source: Ambulatory Visit | Attending: Urology | Admitting: Urology

## 2019-04-13 ENCOUNTER — Other Ambulatory Visit: Payer: Self-pay | Admitting: Cardiovascular Disease

## 2019-04-13 DIAGNOSIS — Z01812 Encounter for preprocedural laboratory examination: Secondary | ICD-10-CM | POA: Diagnosis not present

## 2019-04-13 LAB — SARS CORONAVIRUS 2 (TAT 6-24 HRS): SARS Coronavirus 2: NEGATIVE

## 2019-04-17 ENCOUNTER — Ambulatory Visit (HOSPITAL_COMMUNITY): Payer: Medicare Other | Admitting: Physician Assistant

## 2019-04-17 ENCOUNTER — Encounter (HOSPITAL_COMMUNITY): Payer: Self-pay

## 2019-04-17 ENCOUNTER — Observation Stay (HOSPITAL_COMMUNITY)
Admission: RE | Admit: 2019-04-17 | Discharge: 2019-04-18 | Disposition: A | Discharge status: Home / Self Care | Payer: Medicare Other | Source: Other Acute Inpatient Hospital | Attending: Urology | Admitting: Urology

## 2019-04-17 ENCOUNTER — Encounter (HOSPITAL_COMMUNITY)
Admission: RE | Disposition: A | Discharge status: Home / Self Care | Payer: Self-pay | Source: Other Acute Inpatient Hospital | Attending: Urology

## 2019-04-17 ENCOUNTER — Other Ambulatory Visit: Payer: Self-pay

## 2019-04-17 ENCOUNTER — Ambulatory Visit (HOSPITAL_COMMUNITY): Payer: Medicare Other

## 2019-04-17 ENCOUNTER — Ambulatory Visit (HOSPITAL_COMMUNITY): Payer: Medicare Other | Admitting: Anesthesiology

## 2019-04-17 ENCOUNTER — Telehealth: Payer: Self-pay | Admitting: Cardiovascular Disease

## 2019-04-17 DIAGNOSIS — Z87891 Personal history of nicotine dependence: Secondary | ICD-10-CM | POA: Diagnosis not present

## 2019-04-17 DIAGNOSIS — Z87442 Personal history of urinary calculi: Secondary | ICD-10-CM | POA: Diagnosis not present

## 2019-04-17 DIAGNOSIS — Z96653 Presence of artificial knee joint, bilateral: Secondary | ICD-10-CM | POA: Insufficient documentation

## 2019-04-17 DIAGNOSIS — I2510 Atherosclerotic heart disease of native coronary artery without angina pectoris: Secondary | ICD-10-CM | POA: Diagnosis not present

## 2019-04-17 DIAGNOSIS — I10 Essential (primary) hypertension: Secondary | ICD-10-CM | POA: Insufficient documentation

## 2019-04-17 DIAGNOSIS — G4730 Sleep apnea, unspecified: Secondary | ICD-10-CM | POA: Insufficient documentation

## 2019-04-17 DIAGNOSIS — I4891 Unspecified atrial fibrillation: Secondary | ICD-10-CM | POA: Insufficient documentation

## 2019-04-17 DIAGNOSIS — L400 Psoriasis vulgaris: Secondary | ICD-10-CM | POA: Insufficient documentation

## 2019-04-17 DIAGNOSIS — E890 Postprocedural hypothyroidism: Secondary | ICD-10-CM | POA: Diagnosis not present

## 2019-04-17 DIAGNOSIS — E7800 Pure hypercholesterolemia, unspecified: Secondary | ICD-10-CM | POA: Diagnosis not present

## 2019-04-17 DIAGNOSIS — Z87440 Personal history of urinary (tract) infections: Secondary | ICD-10-CM | POA: Insufficient documentation

## 2019-04-17 DIAGNOSIS — E785 Hyperlipidemia, unspecified: Secondary | ICD-10-CM | POA: Diagnosis not present

## 2019-04-17 DIAGNOSIS — K219 Gastro-esophageal reflux disease without esophagitis: Secondary | ICD-10-CM | POA: Insufficient documentation

## 2019-04-17 DIAGNOSIS — Z79890 Hormone replacement therapy (postmenopausal): Secondary | ICD-10-CM | POA: Insufficient documentation

## 2019-04-17 DIAGNOSIS — N200 Calculus of kidney: Secondary | ICD-10-CM | POA: Diagnosis not present

## 2019-04-17 DIAGNOSIS — C8330 Diffuse large B-cell lymphoma, unspecified site: Secondary | ICD-10-CM | POA: Insufficient documentation

## 2019-04-17 DIAGNOSIS — F419 Anxiety disorder, unspecified: Secondary | ICD-10-CM | POA: Diagnosis not present

## 2019-04-17 DIAGNOSIS — Z7901 Long term (current) use of anticoagulants: Secondary | ICD-10-CM | POA: Insufficient documentation

## 2019-04-17 DIAGNOSIS — F329 Major depressive disorder, single episode, unspecified: Secondary | ICD-10-CM | POA: Insufficient documentation

## 2019-04-17 DIAGNOSIS — J449 Chronic obstructive pulmonary disease, unspecified: Secondary | ICD-10-CM | POA: Insufficient documentation

## 2019-04-17 DIAGNOSIS — I252 Old myocardial infarction: Secondary | ICD-10-CM | POA: Diagnosis not present

## 2019-04-17 DIAGNOSIS — Z96641 Presence of right artificial hip joint: Secondary | ICD-10-CM | POA: Insufficient documentation

## 2019-04-17 HISTORY — PX: NEPHROLITHOTOMY: SHX5134

## 2019-04-17 HISTORY — PX: HOLMIUM LASER APPLICATION: SHX5852

## 2019-04-17 LAB — BASIC METABOLIC PANEL
Anion gap: 7 (ref 5–15)
BUN: 25 mg/dL — ABNORMAL HIGH (ref 8–23)
CO2: 28 mmol/L (ref 22–32)
Calcium: 8.7 mg/dL — ABNORMAL LOW (ref 8.9–10.3)
Chloride: 104 mmol/L (ref 98–111)
Creatinine, Ser: 1.18 mg/dL (ref 0.61–1.24)
GFR calc Af Amer: >60 mL/min (ref >60)
GFR calc non Af Amer: >60 mL/min (ref >60)
Glucose, Bld: 115 mg/dL — ABNORMAL HIGH (ref 70–99)
Potassium: 4.1 mmol/L (ref 3.5–5.1)
Sodium: 139 mmol/L (ref 135–145)

## 2019-04-17 LAB — CBC
HCT: 38.8 % — ABNORMAL LOW (ref 39.0–52.0)
Hemoglobin: 12.2 g/dL — ABNORMAL LOW (ref 13.0–17.0)
MCH: 30 pg (ref 26.0–34.0)
MCHC: 31.4 g/dL (ref 30.0–36.0)
MCV: 95.3 fL (ref 80.0–100.0)
Platelets: 167 10*3/uL (ref 150–400)
RBC: 4.07 MIL/uL — ABNORMAL LOW (ref 4.22–5.81)
RDW: 15.1 % (ref 11.5–15.5)
WBC: 5 10*3/uL (ref 4.0–10.5)
nRBC: 0 % (ref 0.0–0.2)

## 2019-04-17 SURGERY — NEPHROLITHOTOMY PERCUTANEOUS
Anesthesia: General | Laterality: Left

## 2019-04-17 MED ORDER — FENTANYL CITRATE (PF) 100 MCG/2ML IJ SOLN
INTRAMUSCULAR | Status: AC
  Filled 2019-04-17: qty 2

## 2019-04-17 MED ORDER — ALFUZOSIN HCL ER 10 MG PO TB24
10.0000 mg | ORAL_TABLET | Freq: Every day | ORAL | Status: DC
  Administered 2019-04-17: 10 mg via ORAL
  Filled 2019-04-17: qty 1

## 2019-04-17 MED ORDER — BELLADONNA ALKALOIDS-OPIUM 16.2-60 MG RE SUPP: 1.0000 | Freq: Four times a day (QID) | RECTAL | Status: DC | PRN

## 2019-04-17 MED ORDER — LACTATED RINGERS IV SOLN
INTRAVENOUS | Status: DC
  Administered 2019-04-17: 11:00:00 via INTRAVENOUS

## 2019-04-17 MED ORDER — ACETAMINOPHEN 325 MG PO TABS: 650.0000 mg | ORAL_TABLET | ORAL | Status: DC | PRN

## 2019-04-17 MED ORDER — PROPOFOL 10 MG/ML IV BOLUS
INTRAVENOUS | Status: DC | PRN
  Administered 2019-04-17: 150 mg via INTRAVENOUS

## 2019-04-17 MED ORDER — METOPROLOL TARTRATE 25 MG PO TABS
25.0000 mg | ORAL_TABLET | Freq: Two times a day (BID) | ORAL | Status: DC
  Administered 2019-04-17 – 2019-04-18 (×2): 25 mg via ORAL
  Filled 2019-04-17 (×2): qty 1

## 2019-04-17 MED ORDER — ATORVASTATIN CALCIUM 40 MG PO TABS
80.0000 mg | ORAL_TABLET | Freq: Every day | ORAL | Status: DC
  Administered 2019-04-17 – 2019-04-18 (×2): 80 mg via ORAL
  Filled 2019-04-17 (×2): qty 2

## 2019-04-17 MED ORDER — APREMILAST 30 MG PO TABS: 30.0000 mg | ORAL_TABLET | Freq: Two times a day (BID) | ORAL | Status: DC

## 2019-04-17 MED ORDER — ACETAMINOPHEN 500 MG PO TABS
1000.0000 mg | ORAL_TABLET | Freq: Once | ORAL | Status: AC
  Administered 2019-04-17: 1000 mg via ORAL
  Filled 2019-04-17: qty 2

## 2019-04-17 MED ORDER — FENTANYL CITRATE (PF) 100 MCG/2ML IJ SOLN
25.0000 ug | INTRAMUSCULAR | Status: DC | PRN
  Administered 2019-04-17 (×4): 50 ug via INTRAVENOUS

## 2019-04-17 MED ORDER — BUPROPION HCL ER (XL) 300 MG PO TB24
300.0000 mg | ORAL_TABLET | Freq: Every day | ORAL | Status: DC
  Administered 2019-04-18: 300 mg via ORAL
  Filled 2019-04-17: qty 1

## 2019-04-17 MED ORDER — CLONAZEPAM 1 MG PO TABS
1.0000 mg | ORAL_TABLET | Freq: Four times a day (QID) | ORAL | Status: DC | PRN
  Administered 2019-04-17 – 2019-04-18 (×4): 1 mg via ORAL
  Filled 2019-04-17 (×4): qty 1

## 2019-04-17 MED ORDER — ONDANSETRON HCL 4 MG/2ML IJ SOLN
INTRAMUSCULAR | Status: AC
  Filled 2019-04-17: qty 2

## 2019-04-17 MED ORDER — MIRABEGRON ER 25 MG PO TB24
25.0000 mg | ORAL_TABLET | Freq: Every day | ORAL | Status: DC
  Administered 2019-04-17: 25 mg via ORAL
  Filled 2019-04-17: qty 1

## 2019-04-17 MED ORDER — LIDOCAINE 2% (20 MG/ML) 5 ML SYRINGE
INTRAMUSCULAR | Status: DC | PRN
  Administered 2019-04-17: 80 mg via INTRAVENOUS

## 2019-04-17 MED ORDER — DIPHENHYDRAMINE HCL 50 MG/ML IJ SOLN: 12.5000 mg | Freq: Four times a day (QID) | INTRAMUSCULAR | Status: DC | PRN

## 2019-04-17 MED ORDER — SUGAMMADEX SODIUM 200 MG/2ML IV SOLN
INTRAVENOUS | Status: DC | PRN
  Administered 2019-04-17: 300 mg via INTRAVENOUS

## 2019-04-17 MED ORDER — DILTIAZEM HCL ER COATED BEADS 240 MG PO CP24
240.0000 mg | ORAL_CAPSULE | Freq: Every day | ORAL | Status: DC
  Administered 2019-04-18: 240 mg via ORAL
  Filled 2019-04-17: qty 1

## 2019-04-17 MED ORDER — HYDROMORPHONE HCL 1 MG/ML IJ SOLN: 0.5000 mg | INTRAMUSCULAR | Status: DC | PRN

## 2019-04-17 MED ORDER — DULOXETINE HCL 60 MG PO CPEP
60.0000 mg | ORAL_CAPSULE | Freq: Two times a day (BID) | ORAL | Status: DC
  Administered 2019-04-17 – 2019-04-18 (×2): 60 mg via ORAL
  Filled 2019-04-17 (×2): qty 1

## 2019-04-17 MED ORDER — PANTOPRAZOLE SODIUM 40 MG PO TBEC
40.0000 mg | DELAYED_RELEASE_TABLET | Freq: Every day | ORAL | Status: DC
  Administered 2019-04-17 – 2019-04-18 (×2): 40 mg via ORAL
  Filled 2019-04-17 (×2): qty 1

## 2019-04-17 MED ORDER — GLYCOPYRROLATE PF 0.2 MG/ML IJ SOSY
PREFILLED_SYRINGE | INTRAMUSCULAR | Status: AC
  Filled 2019-04-17: qty 1

## 2019-04-17 MED ORDER — EPHEDRINE 5 MG/ML INJ
INTRAVENOUS | Status: AC
  Filled 2019-04-17: qty 10

## 2019-04-17 MED ORDER — ONDANSETRON HCL 4 MG/2ML IJ SOLN
INTRAMUSCULAR | Status: DC | PRN
  Administered 2019-04-17: 4 mg via INTRAVENOUS

## 2019-04-17 MED ORDER — FENTANYL CITRATE (PF) 100 MCG/2ML IJ SOLN
INTRAMUSCULAR | Status: DC | PRN
  Administered 2019-04-17 (×2): 50 ug via INTRAVENOUS

## 2019-04-17 MED ORDER — LEVOTHYROXINE SODIUM 25 MCG PO TABS: 25.0000 ug | ORAL_TABLET | Freq: Every day | ORAL | Status: DC

## 2019-04-17 MED ORDER — ONDANSETRON HCL 4 MG/2ML IJ SOLN: 4.0000 mg | INTRAMUSCULAR | Status: DC | PRN

## 2019-04-17 MED ORDER — ROCURONIUM BROMIDE 10 MG/ML (PF) SYRINGE
PREFILLED_SYRINGE | INTRAVENOUS | Status: DC | PRN
  Administered 2019-04-17: 60 mg via INTRAVENOUS
  Administered 2019-04-17: 10 mg via INTRAVENOUS

## 2019-04-17 MED ORDER — ZOLPIDEM TARTRATE 5 MG PO TABS: 5.0000 mg | ORAL_TABLET | Freq: Every evening | ORAL | Status: DC | PRN

## 2019-04-17 MED ORDER — SODIUM CHLORIDE 0.9 % IV SOLN
INTRAVENOUS | Status: DC
  Administered 2019-04-17: 17:00:00 via INTRAVENOUS

## 2019-04-17 MED ORDER — LORATADINE 10 MG PO TABS
10.0000 mg | ORAL_TABLET | Freq: Every day | ORAL | Status: DC
  Administered 2019-04-18: 10 mg via ORAL
  Filled 2019-04-17: qty 1

## 2019-04-17 MED ORDER — TIZANIDINE HCL 4 MG PO TABS
4.0000 mg | ORAL_TABLET | Freq: Four times a day (QID) | ORAL | Status: DC | PRN
  Administered 2019-04-17 – 2019-04-18 (×4): 4 mg via ORAL
  Filled 2019-04-17 (×4): qty 1

## 2019-04-17 MED ORDER — ALBUTEROL SULFATE (2.5 MG/3ML) 0.083% IN NEBU: 2.5000 mg | INHALATION_SOLUTION | RESPIRATORY_TRACT | Status: DC | PRN

## 2019-04-17 MED ORDER — SODIUM CHLORIDE 0.9 % IV SOLN
2.0000 g | INTRAVENOUS | Status: AC
  Administered 2019-04-17: 2 g via INTRAVENOUS
  Filled 2019-04-17: qty 20

## 2019-04-17 MED ORDER — SODIUM CHLORIDE 0.9 % IR SOLN
Status: DC | PRN
  Administered 2019-04-17: 6000 mL

## 2019-04-17 MED ORDER — EZETIMIBE 10 MG PO TABS
10.0000 mg | ORAL_TABLET | Freq: Every day | ORAL | Status: DC
  Administered 2019-04-17 – 2019-04-18 (×2): 10 mg via ORAL
  Filled 2019-04-17 (×2): qty 1

## 2019-04-17 MED ORDER — PROPOFOL 10 MG/ML IV BOLUS
INTRAVENOUS | Status: AC
  Filled 2019-04-17: qty 20

## 2019-04-17 MED ORDER — IOHEXOL 300 MG/ML  SOLN
INTRAMUSCULAR | Status: DC | PRN
  Administered 2019-04-17: 10 mL

## 2019-04-17 MED ORDER — DIPHENHYDRAMINE HCL 12.5 MG/5ML PO ELIX: 12.5000 mg | ORAL_SOLUTION | Freq: Four times a day (QID) | ORAL | Status: DC | PRN

## 2019-04-17 MED ORDER — SUGAMMADEX SODIUM 500 MG/5ML IV SOLN
INTRAVENOUS | Status: AC
  Filled 2019-04-17: qty 5

## 2019-04-17 MED ORDER — OXYCODONE-ACETAMINOPHEN 5-325 MG PO TABS
1.0000 | ORAL_TABLET | ORAL | Status: DC | PRN
  Administered 2019-04-17: 2 via ORAL
  Administered 2019-04-17: 1 via ORAL
  Administered 2019-04-18: 2 via ORAL
  Administered 2019-04-18: 1 via ORAL
  Filled 2019-04-17 (×2): qty 2
  Filled 2019-04-17 (×2): qty 1

## 2019-04-17 MED ORDER — LEVOTHYROXINE SODIUM 25 MCG PO TABS
225.0000 ug | ORAL_TABLET | Freq: Every day | ORAL | Status: DC
  Administered 2019-04-18: 225 ug via ORAL
  Filled 2019-04-17: qty 1

## 2019-04-17 MED ORDER — LEVOTHYROXINE SODIUM 100 MCG PO TABS: 200.0000 ug | ORAL_TABLET | Freq: Every day | ORAL | Status: DC

## 2019-04-17 SURGICAL SUPPLY — 66 items
APL SKNCLS STERI-STRIP NONHPOA (GAUZE/BANDAGES/DRESSINGS) ×1
BAG URINE DRAINAGE (UROLOGICAL SUPPLIES) ×4 IMPLANT
BASKET STONE NITINOL 3FRX115MB (UROLOGICAL SUPPLIES) IMPLANT
BASKET ZERO TIP NITINOL 2.4FR (BASKET) IMPLANT
BENZOIN TINCTURE PRP APPL 2/3 (GAUZE/BANDAGES/DRESSINGS) ×4 IMPLANT
BLADE SURG 15 STRL LF DISP TIS (BLADE) ×1 IMPLANT
BLADE SURG 15 STRL SS (BLADE) ×3
BSKT STON RTRVL ZERO TP 2.4FR (BASKET)
CATH FOLEY 2W COUNCIL 20FR 5CC (CATHETERS) IMPLANT
CATH FOLEY 2W COUNCIL 5CC 18FR (CATHETERS) ×2 IMPLANT
CATH FOLEY 2WAY SLVR  5CC 16FR (CATHETERS)
CATH FOLEY 2WAY SLVR  5CC 18FR (CATHETERS)
CATH FOLEY 2WAY SLVR 5CC 16FR (CATHETERS) ×1 IMPLANT
CATH FOLEY 2WAY SLVR 5CC 18FR (CATHETERS) ×1 IMPLANT
CATH IMAGER II 65CM (CATHETERS) ×3 IMPLANT
CATH INTERMIT  6FR 70CM (CATHETERS) ×1 IMPLANT
CATH ROBINSON RED A/P 20FR (CATHETERS) IMPLANT
CATH URET DUAL LUMEN 6-10FR 50 (CATHETERS) IMPLANT
CATH X-FORCE N30 NEPHROSTOMY (TUBING) ×3 IMPLANT
COVER SURGICAL LIGHT HANDLE (MISCELLANEOUS) ×1 IMPLANT
COVER WAND RF STERILE (DRAPES) IMPLANT
DRAPE C-ARM 42X120 X-RAY (DRAPES) ×3 IMPLANT
DRAPE LINGEMAN PERC (DRAPES) ×3 IMPLANT
DRAPE SHEET LG 3/4 BI-LAMINATE (DRAPES) ×3 IMPLANT
DRAPE SURG IRRIG POUCH 19X23 (DRAPES) ×3 IMPLANT
DRSG PAD ABDOMINAL 8X10 ST (GAUZE/BANDAGES/DRESSINGS) ×4 IMPLANT
DRSG TEGADERM 8X12 (GAUZE/BANDAGES/DRESSINGS) ×4 IMPLANT
FIBER LASER FLEXIVA 1000 (UROLOGICAL SUPPLIES) IMPLANT
FIBER LASER FLEXIVA 365 (UROLOGICAL SUPPLIES) IMPLANT
FIBER LASER FLEXIVA 550 (UROLOGICAL SUPPLIES) IMPLANT
FIBER LASER TRAC TIP (UROLOGICAL SUPPLIES) ×2 IMPLANT
GAUZE SPONGE 4X4 12PLY STRL (GAUZE/BANDAGES/DRESSINGS) ×3 IMPLANT
GLOVE BIO SURGEON STRL SZ8 (GLOVE) ×5 IMPLANT
GLOVE BIOGEL PI IND STRL 8 (GLOVE) IMPLANT
GLOVE BIOGEL PI INDICATOR 8 (GLOVE)
GOWN STRL REUS W/TWL LRG LVL3 (GOWN DISPOSABLE) ×4 IMPLANT
GOWN STRL REUS W/TWL XL LVL3 (GOWN DISPOSABLE) ×3 IMPLANT
GUIDEWIRE AMPLAZ .035X145 (WIRE) ×6 IMPLANT
GUIDEWIRE STR DUAL SENSOR (WIRE) ×4 IMPLANT
IV SET EXTENSION CATH 6 NF (IV SETS) ×1 IMPLANT
KIT BASIN OR (CUSTOM PROCEDURE TRAY) ×3 IMPLANT
KIT PROBE 340X3.4XDISP GRN (MISCELLANEOUS) IMPLANT
KIT PROBE TRILOGY 3.4X340 (MISCELLANEOUS)
KIT PROBE TRILOGY 3.9X350 (MISCELLANEOUS) IMPLANT
KIT TURNOVER KIT A (KITS) IMPLANT
MANIFOLD NEPTUNE II (INSTRUMENTS) ×3 IMPLANT
NDL TROCAR 18X15 ECHO (NEEDLE) IMPLANT
NDL TROCAR 18X20 (NEEDLE) IMPLANT
NEEDLE TROCAR 18X15 ECHO (NEEDLE) IMPLANT
NEEDLE TROCAR 18X20 (NEEDLE) IMPLANT
NS IRRIG 1000ML POUR BTL (IV SOLUTION) ×3 IMPLANT
PACK CYSTO (CUSTOM PROCEDURE TRAY) ×3 IMPLANT
SET AMPLATZ RENAL DILATOR (MISCELLANEOUS) IMPLANT
SET IRRIG Y TYPE TUR BLADDER L (SET/KITS/TRAYS/PACK) ×1 IMPLANT
SHEATH PEELAWAY SET 9 (SHEATH) ×2 IMPLANT
SPONGE LAP 4X18 RFD (DISPOSABLE) ×3 IMPLANT
STENT URET 6FRX26 CONTOUR (STENTS) ×2 IMPLANT
SUT SILK 2 0 30  PSL (SUTURE) ×2
SUT SILK 2 0 30 PSL (SUTURE) ×1 IMPLANT
SYR 10ML LL (SYRINGE) ×1 IMPLANT
SYR 20CC LL (SYRINGE) ×4 IMPLANT
SYR 50ML LL SCALE MARK (SYRINGE) ×1 IMPLANT
TOWEL OR 17X26 10 PK STRL BLUE (TOWEL DISPOSABLE) ×3 IMPLANT
TUBING CONNECTING 10 (TUBING) ×4 IMPLANT
TUBING CONNECTING 10' (TUBING) ×2
WATER STERILE IRR 1000ML POUR (IV SOLUTION) ×3 IMPLANT

## 2019-04-17 NOTE — Transfer of Care (Signed)
Immediate Anesthesia Transfer of Care Note  Patient: [name]  Procedure(s) Performed: NEPHROLITHOTOMY PERCUTANEOUS (Left ) HOLMIUM LASER APPLICATION (Left )  Patient Location: PACU  Anesthesia Type:General  Level of Consciousness: awake, alert  and oriented  Airway & Oxygen Therapy: Patient Spontanous Breathing and Patient connected to face mask oxygen  Post-op Assessment: Report given to RN and Post -op Vital signs reviewed and stable  Post vital signs: Reviewed and stable  Last Vitals:  Vitals Value Taken Time  BP    Temp    Pulse 56 04/17/19 1428  Resp 14 04/17/19 1428  SpO2 100 % 04/17/19 1428  Vitals shown include unvalidated device data.  Last Pain:  Vitals:   04/17/19 1028  TempSrc:   PainSc: 0-No pain         Complications: No apparent anesthesia complications

## 2019-04-17 NOTE — Telephone Encounter (Signed)
Patient states that Dr. Oval Linsey is no longer covered by his health insurance. He has [employer].   He would like to clarify that Dr. Oval Linsey does in fact take his insurance. If she does not take his insurance he would like to switch to a doctor that does.

## 2019-04-17 NOTE — Op Note (Signed)
Preoperative diagnosis: Left renal stone  Postoperative diagnosis: Same  Procedure 1.  Left percutaneous nephrostolithotomy for stone greater than 2 cm 2.  Left nephrostogram 3.  Intraoperative fluoroscopy, under 1 hour, with interpretation 4.  Placement of a 6 x 26 double-J ureteral stent. 5.  Left ureteroscopy with lithotripsy and stone extraction 6.  Placement of a 56 French nephrostomy tube 7.  Dilation of percutaneous tract  Attending: Dr. Alyson Ingles  Anesthesia: General  Estimated blood loss: Minimal  Antibiotics: rocephin  Drains: 1.  89 French Foley catheter 2.  6 x 26 left double-J ureteral stent 3.  18 French nephrostomy tube  Specimens: Stone for analysis  Findings: multiple 1-1.5cm renal pelvis calculi. Multiple fragments in the left distal ureter.  Indications: Patient is a 73 year old male with a history of large left renal stones.  After discussing treatment options and decided she was left percutaneous nephrostolithotomy.  Patient already has a nephrostomy tube.  Procedure in detail: Prior to procedure consent was obtained.  Patient was brought to the operating room debridement was done to ensure correct patient, correct procedure, and correct site.  General anesthesia was administered.  A 16 French Foley catheter was in place.  The patient was then placed in the prone position.  His nephrostomy tube and left flank was then prepped and draped in usual sterile fashion.  A nephrostogram was obtained and findings noted above.  Through the nephrostomy tube we then placed a sensor wire.  Sensor wire was coiled in the renal pelvis we then removed the nephrostomy tube. We advanced ureteroscope through the nephrostomy access and to the renal pelvis. We then used a 200 nm laser fiber to fragment the stones at the proximal ureter in to multiple pieces.  Using an in [facility] removed stone fragments and sent for composition analysis.   We then advanced the ureteroscope down to the  level of bladder.  We then placed a second wire through the ureteroscope into the bladder. We then removed the flexible ureteroscope.   We then made an incision at the level of the skin and over the wire we then placed a NephroMax dilator.  We dilated the nephrostomy tract to 30 [place] and held this 18 cm of water for 1 minute.  We then  placed the access sheath over the balloon.  The balloon was then deflated.  We then used a rigid nephroscope to perform nephroscopy.  We encountered a large renal pelvis stones at the ureteropelvic junction.  Using the Trilogy the stones were fragmented and evacuated. Once the stones were evacuated we then placed a stent.  We then removed thenephroscope and over the sensor wire placed a 6 x 26 double-J ureteral stent.  The wire was then removed and good coil was noted in the renal pelvis under direct vision in the bladder under fluoroscopy.  We then placed a 18 French nephrostomy tube through the sheath into the renal pelvis.  The balloon was inflated with 3 ml of contrast.  We then removed the access sheath in and obtain another nephrostogram.  We noted minimal extravasation of contrast.  We then secured the nephrostomy tubes with 0 silks in interrupted fashion.  Dressing was placed over the nephrostomy tube site and this then concluded the procedure was well-tolerated by the patient.  Complications: None  Condition: Stable, extubated, transferred to PACU  Plan: Patient is to be admitted overnight for observation.  His Foley catheter through the morning.  He is then to be discharged home  and followup in 5-7 days for nephrostomy tube removal

## 2019-04-17 NOTE — Interval H&P Note (Signed)
History and Physical Interval Note:  04/17/2019 12:03 PM  [name]  has presented today for surgery, with the diagnosis of LEFT RENAL CALCULUS.  The various methods of treatment have been discussed with the patient and family. After consideration of risks, benefits and other options for treatment, the patient has consented to  Procedure(s) with comments: NEPHROLITHOTOMY PERCUTANEOUS (Left) - 2 HRS HOLMIUM LASER APPLICATION (Left) as a surgical intervention.  The patient's history has been reviewed, patient examined, no change in status, stable for surgery.  I have reviewed the patient's chart and labs.  Questions were answered to the patient's satisfaction.     [name]

## 2019-04-17 NOTE — Anesthesia Procedure Notes (Signed)
Procedure Name: Intubation Date/Time: 04/17/2019 12:47 PM Performed by: [place] Chaquana Nichols C, CRNA Pre-anesthesia Checklist: Patient identified, Emergency Drugs available, Suction available and Patient being monitored Patient Re-evaluated:Patient Re-evaluated prior to induction Oxygen Delivery Method: Circle system utilized Preoxygenation: Pre-oxygenation with 100% oxygen Induction Type: IV induction Ventilation: Mask ventilation without difficulty Laryngoscope Size: Mac and 4 Grade View: Grade II Tube type: Oral Tube size: 7.5 mm Number of attempts: 1 Airway Equipment and Method: Stylet and Oral airway Placement Confirmation: ETT inserted through vocal cords under direct vision,  positive ETCO2 and breath sounds checked- equal and bilateral Secured at: 22 cm Tube secured with: Tape Dental Injury: Teeth and Oropharynx as per pre-operative assessment

## 2019-04-17 NOTE — Anesthesia Postprocedure Evaluation (Signed)
Anesthesia Post Note  Patient: [name]  Procedure(s) Performed: NEPHROLITHOTOMY PERCUTANEOUS (Left ) HOLMIUM LASER APPLICATION (Left )     Patient location during evaluation: PACU Anesthesia Type: General Level of consciousness: awake and alert Pain management: pain level controlled Vital Signs Assessment: post-procedure vital signs reviewed and stable Respiratory status: spontaneous breathing, nonlabored ventilation, respiratory function stable and patient connected to nasal cannula oxygen Cardiovascular status: blood pressure returned to baseline and stable Postop Assessment: no apparent nausea or vomiting Anesthetic complications: no    Last Vitals:  Vitals:   04/17/19 1430 04/17/19 1445  BP: 115/86 (!) 145/67  Pulse: 65 62  Resp: 17 12  Temp:    SpO2: 100% 96%    Last Pain:  Vitals:   04/17/19 1445  TempSrc:   PainSc: 9                  Montez Hageman

## 2019-04-18 ENCOUNTER — Encounter (HOSPITAL_COMMUNITY): Payer: Self-pay | Admitting: Urology

## 2019-04-18 DIAGNOSIS — N200 Calculus of kidney: Secondary | ICD-10-CM | POA: Diagnosis not present

## 2019-04-18 LAB — CBC
HCT: 38.1 % — ABNORMAL LOW (ref 39.0–52.0)
Hemoglobin: 11.5 g/dL — ABNORMAL LOW (ref 13.0–17.0)
MCH: 28.8 pg (ref 26.0–34.0)
MCHC: 30.2 g/dL (ref 30.0–36.0)
MCV: 95.3 fL (ref 80.0–100.0)
Platelets: 166 10*3/uL (ref 150–400)
RBC: 4 MIL/uL — ABNORMAL LOW (ref 4.22–5.81)
RDW: 15 % (ref 11.5–15.5)
WBC: 6.9 10*3/uL (ref 4.0–10.5)
nRBC: 0 % (ref 0.0–0.2)

## 2019-04-18 LAB — BASIC METABOLIC PANEL
Anion gap: 6 (ref 5–15)
BUN: 23 mg/dL (ref 8–23)
CO2: 26 mmol/L (ref 22–32)
Calcium: 8.6 mg/dL — ABNORMAL LOW (ref 8.9–10.3)
Chloride: 104 mmol/L (ref 98–111)
Creatinine, Ser: 1.06 mg/dL (ref 0.61–1.24)
GFR calc Af Amer: >60 mL/min (ref >60)
GFR calc non Af Amer: >60 mL/min (ref >60)
Glucose, Bld: 99 mg/dL (ref 70–99)
Potassium: 4.1 mmol/L (ref 3.5–5.1)
Sodium: 136 mmol/L (ref 135–145)

## 2019-04-18 MED ORDER — OXYCODONE-ACETAMINOPHEN 5-325 MG PO TABS: 1.0000 | ORAL_TABLET | ORAL | 0 refills | Status: DC | PRN

## 2019-04-18 NOTE — Telephone Encounter (Signed)
Please advise. Thank you

## 2019-04-18 NOTE — Discharge Instructions (Signed)
Percutaneous Nephrostomy, Care After °This sheet gives you information about how to care for yourself after your procedure. Your health care provider may also give you more specific instructions. If you have problems or questions, contact your health care provider. °What can I expect after the procedure? °After the procedure, it is common to have: °· Some soreness where the nephrostomy tube was inserted (tube insertion site). °· Blood-tinged drainage from the nephrostomy tube for the first 24 hours. °Follow these instructions at home: °Activity °· Return to your normal activities as told by your health care provider. Ask your health care provider what activities are safe for you. °· Avoid activities that may cause the nephrostomy tubing to bend. °· Do not take baths, swim, or use a hot tub until your health care provider approves. Ask your health care provider if you can take showers. Cover the nephrostomy tube dressing with a watertight covering when you take a shower. °· Do not drive for 24 hours if you were given a medicine to help you relax (sedative). °Care of the tube insertion site ° °· Follow instructions from your health care provider about how to take care of your tube insertion site. Make sure you: °? Wash your hands with soap and water before you change your bandage (dressing). If soap and water are not available, use hand sanitizer. °? Change your dressing as told by your health care provider. Be careful not to pull on the tube while removing the dressing. °? When you change the dressing, wash the skin around the tube, rinse well, and pat the skin dry. °· Check the tube insertion area every day for signs of infection. Check for: °? More redness, swelling, or pain. °? More fluid or blood. °? Warmth. °? Pus or a bad smell. °Care of the nephrostomy tube and drainage bag °· Always keep the tubing, the leg bag, or the bedside drainage bags below the level of the kidney so that your urine drains  freely. °· When connecting your nephrostomy tube to a drainage bag, make sure that there are no kinks in the tubing and that your urine is draining freely. You may want to use an elastic bandage to wrap any exposed tubing that goes from the nephrostomy tube to any of the connecting tubes. °· At night, you may want to connect your nephrostomy tube or the leg bag to a larger bedside drainage bag. °· Follow instructions from your health care provider about how to empty or change the drainage bag. °· Empty the drainage bag when it becomes ? full. °· Replace the drainage bag and any extension tubing that is connected to your nephrostomy tube every 3 weeks or as often as told by your health care provider. Your health care provider will explain how to change the drainage bag and extension tubing. °General instructions °· Take over-the-counter and prescription medicines only as told by your health care provider. °· Keep all follow-up visits as told by your health care provider. This is important. °Contact a health care provider if: °· You have problems with any of the valves or tubing. °· You have persistent pain or soreness in your back. °· You have more redness, swelling, or pain around your tube insertion site. °· You have more fluid or blood coming from your tube insertion site. °· Your tube insertion site feels warm to the touch. °· You have pus or a bad smell coming from your tube insertion site. °· You have increased urine output or you feel   burning when urinating. °Get help right away if: °· You have pain in your abdomen during the first week. °· You have chest pain or have trouble breathing. °· You have a new appearance of blood in your urine. °· You have a fever or chills. °· You have back pain that is not relieved by your medicine. °· You have decreased urine output. °· Your nephrostomy tube comes out. °This information is not intended to replace advice given to you by your health care provider. Make sure you  discuss any questions you have with your health care provider. °Document Released: 05/20/2004 Document Revised: 09/09/2017 Document Reviewed: 07/09/2016 °Elsevier Patient Education © 2020 [employer] ° °

## 2019-04-19 ENCOUNTER — Telehealth: Payer: Self-pay | Admitting: *Deleted

## 2019-04-19 NOTE — Discharge Summary (Signed)
Physician Discharge Summary  Patient ID: [name] MRN: [id number] DOB/AGE: 1945/12/12 73 y.o.  Admit date: 04/17/2019 Discharge date: 04/19/2019  Admission Diagnoses:  Renal calculus  Discharge Diagnoses:  Active Problems:   Renal calculus   Past Medical History:  Diagnosis Date  . A-fib (HCC)    hx.- sinus rhythm now  . Acid reflux   . AKI (acute kidney injury) (HCC)    mild  . Anxiety   . Arthritis   . Atrial dilatation, bilateral    Severly, enlargement  per ECHO 11/03/16  . BPH (benign prostatic hyperplasia)   . CAD (coronary artery disease)   . Cancer ([place])   . Depression   . Dislocation of internal right hip prosthesis, sequela   . Dysrhythmia    hx a fib  . Elevated brain natriuretic peptide (BNP) level   . Environmental allergies   . Fracture of wrist 10/23/13   LEFT  . High cholesterol   . History of Graves' disease   . History of kidney stones    surg. removal 02/2017  . History of syncope   . HLD (hyperlipidemia)   . Hypertension   . Hypothyroidism    thyroid removed  . Left knee injury    cap dislocated  . Low testosterone, possible hypogonadism 10/01/2012  . Lumbago   . Metabolic bone disease   . Multiple falls   . Osteoporosis    Severe  . Other abnormalities of gait and mobility   . Plaque psoriasis   . Sleep apnea    cpap machine-settings 17  . Thrombocytopenia ([place])   . Thyroid disease    HX GRAVES DISEASE  . Transfusion history    s/p 12'13 hip surgery  . Unsteady gait   . UTI (urinary tract infection)   . Vertebral compression fracture (HCC)    L1-wears brace    Surgeries: Procedure(s): NEPHROLITHOTOMY PERCUTANEOUS HOLMIUM LASER APPLICATION on 10/14/863   Consultants (if any):   Discharged Condition: Improved  Hospital Course: [name] is an 73 y.o. male who was admitted 04/17/2019 with a diagnosis of staghorn renal calculus and went to the operating room on 04/17/2019 and underwent the above named procedures.  He did  we post-operatively and was discharged home on POD#1. Foley was removed prior to discharge.  He was given perioperative antibiotics:  Anti-infectives (From admission, onward)   Start     Dose/Rate Route Frequency Ordered Stop   04/17/19 0948  cefTRIAXone (ROCEPHIN) 2 g in sodium chloride 0.9 % 100 mL IVPB     2 g 200 mL/hr over 30 Minutes Intravenous 30 min pre-op 04/17/19 0948 04/17/19 1250    .  He was given sequential compression devices, early ambulation, and SCDs for DVT prophylaxis.  He benefited maximally from the hospital stay and there were no complications.    Recent vital signs:  Vitals:   04/18/19 0521 04/18/19 1108  BP: (!) 156/72 128/68  Pulse: 67 64  Resp: 18 18  Temp: (!) 97.5 F (36.4 C) 97.8 F (36.6 C)  SpO2: 95% 95%    Recent laboratory studies:  Lab Results  Component Value Date   HGB 11.5 (L) 04/18/2019   HGB 12.2 (L) 04/17/2019   HGB 12.0 (L) 04/10/2019   Lab Results  Component Value Date   WBC 6.9 04/18/2019   PLT 166 04/18/2019   Lab Results  Component Value Date   INR 1.0 03/26/2019   Lab Results  Component Value Date   NA  136 04/18/2019   K 4.1 04/18/2019   CL 104 04/18/2019   CO2 26 04/18/2019   BUN 23 04/18/2019   CREATININE 1.06 04/18/2019   GLUCOSE 99 04/18/2019    Discharge Medications:   Allergies as of 04/18/2019      Reactions   Short Ragweed Pollen Ext Cough      Medication List    TAKE these medications   acetaminophen 500 MG tablet Commonly known as: TYLENOL Take 1,000 mg by mouth daily as needed for moderate pain.   albuterol (2.5 MG/3ML) 0.083% nebulizer solution Commonly known as: PROVENTIL Take 3 mLs (2.5 mg total) by nebulization every 4 (four) hours as needed for wheezing or shortness of breath.   alfuzosin 10 MG 24 hr tablet Commonly known as: UROXATRAL Take 10 mg by mouth at bedtime.   atorvastatin 80 MG tablet Commonly known as: LIPITOR Take 1 tablet (80 mg total) by mouth daily.   buPROPion  300 MG 24 hr tablet Commonly known as: WELLBUTRIN XL TAKE 1 TABLET DAILY WITH  BREAKFAST BY MOUTH. What changed: See the new instructions.   CALCIUM CITRATE + D PO Take 1 tablet by mouth 2 (two) times daily.   clonazePAM 1 MG tablet Commonly known as: KLONOPIN Take 1 tablet (1 mg total) by mouth 3 (three) times daily as needed for anxiety. TAKE 1 TABLET BY MOUTH  EVERY 6 HOURS prn What changed:   when to take this  additional instructions   diltiazem 240 MG 24 hr capsule Commonly known as: CARDIZEM CD TAKE 1 CAPSULE BY MOUTH  DAILY   doxycycline 100 MG tablet Commonly known as: VIBRA-TABS Take 1 tablet (100 mg total) by mouth daily.   DULoxetine 60 MG capsule Commonly known as: CYMBALTA TAKE 1 CAPSULE BY MOUTH  TWICE DAILY   EpiPen 2-Pak 0.3 mg/0.3 mL Soaj injection Generic drug: EPINEPHrine Inject 0.3 mg into the muscle daily as needed (for anaphylactic reaction).   ezetimibe 10 MG tablet Commonly known as: ZETIA Take 1 tablet (10 mg total) by mouth daily.   ferrous sulfate 325 (65 FE) MG tablet Take 325 mg by mouth 2 (two) times daily with a meal.   HYDROcodone-acetaminophen 7.5-325 MG tablet Commonly known as: NORCO Take 1 tablet by mouth 4 (four) times daily as needed for moderate pain. Please fill on 04/06/19   HYDROcodone-acetaminophen 7.5-325 MG tablet Commonly known as: NORCO Take 1 tablet by mouth 4 (four) times daily as needed for moderate pain.   HYDROcodone-acetaminophen 7.5-325 MG tablet Commonly known as: NORCO Take 1 tablet by mouth every 6 (six) hours as needed for severe pain.   levothyroxine 25 MCG tablet Commonly known as: SYNTHROID TAKE 1 TABLET BY MOUTH  DAILY BEFORE BREAKFAST. What changed: See the new instructions.   levothyroxine 200 MCG tablet Commonly known as: SYNTHROID TAKE 1 TABLET DAILY AT 6 AM BY MOUTH. What changed: See the new instructions.   loratadine 10 MG tablet Commonly known as: CLARITIN Take 10 mg by mouth daily.    Magnesium 500 MG Caps Take 1 capsule (500 mg total) by mouth daily.   metoprolol tartrate 25 MG tablet Commonly known as: LOPRESSOR TAKE 1 TABLET BY MOUTH TWO  TIMES DAILY   multivitamin with minerals Tabs tablet Take 1 tablet by mouth daily. Men's One-A-Day 50+   Myrbetriq 25 MG Tb24 tablet Generic drug: mirabegron ER Take 25 mg by mouth at bedtime.   nitroGLYCERIN 0.4 MG SL tablet Commonly known as: NITROSTAT Place 0.4 mg under  the tongue every 5 (five) minutes as needed for chest pain. x3 doses as needed for chest pain   omeprazole 40 MG capsule Commonly known as: PRILOSEC TAKE 1 CAPSULE DAILY BEFORE BREAKFAST BY MOUTH. What changed: See the new instructions.   ondansetron 4 MG tablet Commonly known as: ZOFRAN Take 1 tablet (4 mg total) by mouth every 6 (six) hours as needed for nausea.   Otezla 30 MG Tabs Generic drug: Apremilast Take 30 mg by mouth 2 (two) times a day.   oxyCODONE-acetaminophen 5-325 MG tablet Commonly known as: Percocet Take 1 tablet by mouth every 4 (four) hours as needed for moderate pain or severe pain.   PROBIOTIC PO Take 1 capsule by mouth daily with breakfast.   prochlorperazine 10 MG tablet Commonly known as: COMPAZINE Take 1 tablet (10 mg total) by mouth every 6 (six) hours as needed (Nausea or vomiting).   Prolia 60 MG/ML Sosy injection Generic drug: denosumab Inject 60 mg as directed every 6 (six) months. Last injection: Feb 2020 Next injection: August 2020   testosterone enanthate 200 MG/ML injection Commonly known as: DELATESTRYL INJECT 1ML (200MG ) INTO THE MUSCLE EVERY 14 DAYS. DISCARD VIAL AFTER 28 DAYS. What changed: See the new instructions.   tiZANidine 4 MG tablet Commonly known as: ZANAFLEX TAKE 1 TABLET BY MOUTH  EVERY 6 HOURS AS NEEDED FOR MUSCLE SPASM(S) What changed: See the new instructions.   vitamin C 500 MG tablet Commonly known as: ASCORBIC ACID Take 500 mg by mouth daily.   VITAMIN D-3 PO Take 2,000  Units by mouth 2 (two) times daily.   Xarelto 10 MG Tabs tablet Generic drug: rivaroxaban TAKE 1 TABLET DAILY BY  MOUTH. What changed: how much to take       Diagnostic Studies: Dg C-arm 1-60 Min-no Report  Result Date: 04/17/2019 Fluoroscopy was utilized by the requesting physician.  No radiographic interpretation.   Dg C-arm 1-60 Min-no Report  Result Date: 03/26/2019 Fluoroscopy was utilized by the requesting physician.  No radiographic interpretation.   Ir Nephrostomy Placement Left  Result Date: 03/29/2019 INDICATION: 73 year old male with a history nephrolithiasis on the left. He has been referred for percutaneous access. EXAM: IR NEPHROSTOMY PLACEMENT LEFT COMPARISON:  03/26/2019, CT 01/23/2019 MEDICATIONS: 1 g Rocephin; The antibiotic was administered in an appropriate time frame prior to skin puncture. ANESTHESIA/SEDATION: Fentanyl 6 mcg IV; Versed 200 mg IV Moderate Sedation Time:  44 minutes The patient was continuously monitored during the procedure by the interventional radiology nurse under my direct supervision. CONTRAST:  40 cc-administered into the collecting system(s) FLUOROSCOPY TIME:  Fluoroscopy Time: 14 minutes 30 seconds (676 mGy). COMPLICATIONS: None PROCEDURE: Informed written consent was obtained from the patient after a thorough discussion of the procedural risks, benefits and alternatives. All questions were addressed. Maximal Sterile Barrier Technique was utilized including caps, mask, sterile gowns, sterile gloves, sterile drape, hand hygiene and skin antiseptic. A timeout was performed prior to the initiation of the procedure. Patient positioned prone position on the fluoroscopy table. Ultrasound survey of the left flank was performed with images stored and sent to PACs. The patient was then prepped and draped in the usual sterile fashion. 1% lidocaine was used to anesthetize the skin and subcutaneous tissues for local anesthesia. A Chiba needle was then used to  access the central collecting system from from [place] approach targeting calcium in the collecting system. Once the needle was in position, contrast injected confirmed location within the renal pelvis. A second needle was then  used to target and inferior posterior calyx with an oblique position of the image intensifier. Needle tip was confirmed with injection of contrast at the second needle site. Passage of an 018 micro wire into the collecting system was performed under fluoroscopy. A small incision was made with an 11 blade scalpel, and the needle was removed from the wire. An Accustick system was then advanced over the wire into the collecting system under fluoroscopy. The metal stiffener and inner dilator were removed, and then a sample of fluid was aspirated through the 4 French outer sheath. Bentson wire was passed into the collecting system and the sheath removed. Ten French dilation of the soft tissues was performed. At this point the first needle puncture was abandoned. The initial 10 French standard pigtail drain catheter would not formed in the decompressed collecting system. Various techniques were used included in exchange of wire for a stiff Amplatz wire, the metal stiffener, and attempted replacement. This catheter became damaged and we again attempted a second 10 French drain catheter. Ultimately we abandoned this size catheter and placed a [place]. Wire and inner stiffener removed, and the pigtail was formed in the collecting system. Small amount of contrast confirmed position of the catheter. Patient tolerated the procedure well and remained hemodynamically stable throughout. No complications were encountered and no significant blood loss encountered IMPRESSION: Status post placement of left-sided percutaneous nephrostomy. Signed, Dulcy Fanny. Dellia Nims, RPVI [facility] Radiology Electronically Signed   By: Corrie Mckusick D.O.   On:  03/29/2019 07:12   Ir Ureteral Stent Left New Access W/o Sep Nephrostomy Cath  Result Date: 03/26/2019 INDICATION: Left-sided staghorn renal calculus and need for percutaneous access prior to operative nephrolithotomy. EXAM: PERCUTANEOUS LEFT RENAL ACCESS AND URETERAL CATHETER PLACEMENT UNDER FLUOROSCOPY COMPARISON:  CT of the abdomen and pelvis without contrast on 01/23/2019 MEDICATIONS: 400 mg IV Cipro; The antibiotic was administered in an appropriate time frame prior to skin puncture. ANESTHESIA/SEDATION: Fentanyl 100 mcg IV; Versed 3.0 mg IV Moderate Sedation Time:  26 minutes. The patient was continuously monitored during the procedure by the interventional radiology nurse under my direct supervision. CONTRAST:  15 mL Omnipaque 300-administered into the collecting system(s) FLUOROSCOPY TIME:  Fluoroscopy Time: 9 minutes and 54 seconds. 422.1 mGy. COMPLICATIONS: None immediate. PROCEDURE: Informed written consent was obtained from the patient after a thorough discussion of the procedural risks, benefits and alternatives. All questions were addressed. Maximal Sterile Barrier Technique was utilized including caps, mask, sterile gowns, sterile gloves, sterile drape, hand hygiene and skin antiseptic. A timeout was performed prior to the initiation of the procedure. The left flank region was prepped with chlorhexidine in a prone position. Initial 21 gauge needle pass was made under ultrasound guidance into the lower pole of the left kidney. Aspiration was performed through the needle and the needle removed. Under fluoroscopy, a lower pole calyx containing a calculus was targeted with a 21 gauge needle. Aspiration was performed followed by contrast injection. A guidewire was advanced into the collecting system. A transitional dilator was placed. Over a standard guidewire, a 5 French catheter was placed. The catheter was further directed into the proximal ureter over a hydrophilic wire. The catheter was then  further advanced down the ureter and into the bladder. The catheter was capped and secured at the skin with a silk retention suture. FINDINGS: Ultrasound demonstrates no evidence of hydronephrosis. There is intense shadowing in the central collecting system due to the known staghorn  calculus occupying the renal pelvis. With a single pass into the lower pole of the left kidney, there was inability to access the collecting system. A smaller calculus located in a lower pole calyx was targeted under fluoroscopy and accessed allowing injection of contrast into the collecting system. After obtaining access in the collecting system, catheter access was then able to be directed centrally and down the left ureter into the bladder. Five French ureteral catheter access will be utilized during operative percutaneous nephrolithotomy. IMPRESSION: Percutaneous left renal access at the level of a lower pole calyx containing a calculus with advancement of a 5 French catheter past the large central staghorn calculus occupying the renal pelvis and down the ureter and into the bladder. This access will be utilized for percutaneous nephrolithotomy later today. Electronically Signed   By: Aletta Edouard M.D.   On: 03/26/2019 14:58    Disposition: Discharge disposition: 01-Home or Self Care       Discharge Instructions    Discharge patient   Complete by: As directed    Discharge disposition: 01-Home or Self Care   Discharge patient date: 04/18/2019         Signed: [name] 04/19/2019, 12:01 PM

## 2019-04-19 NOTE — Telephone Encounter (Signed)
Pt was on TCM report admitted 04/17/19 for Renal calculus. Pt underwent a NEPHROLITHOTOMY PERCUTANEOUS HOLMIUM LASER APPLICATION which tolerating procedure well. Pt D/C 04/19/19, and will follow=up urologist McKenzie, Saralyn Pilar  in 2 wks.Marland KitchenJohny Chess

## 2019-05-02 NOTE — Progress Notes (Signed)
HEMATOLOGY/ONCOLOGY CLINIC NOTE  Date of Service: 05/03/2019  Patient Care Team: Plotnikov, Evie Lacks, MD as PCP - General (Internal Medicine) Brunetta Genera, MD as Consulting Physician (Hematology) McKenzie, Candee Furbish, MD as Consulting Physician (Urology) Jackquline Denmark, MD as Consulting Physician (Gastroenterology)  CHIEF COMPLAINTS/PURPOSE OF CONSULTATION:  Follow Up for Diffuse Large B-Cell Lymphoma   HISTORY OF PRESENTING ILLNESS:   [name] is a wonderful 73 y.o. male who has been referred to [place] by Dr. Lew Dawes for evaluation and management of Diffuse Large B-Cell Lymphoma. He is accompanied today by his partner. The pt reports that he is doing well overall.   The pt reports that he presented to ENT Dr. Pollie Friar care after developing a cough and a new nodule in his mouth. He notes that the symptoms presented about a month ago and have not progressed or significantly worsened. He notes that he has frequent sinus drainage, and felt that this enlargement could initially be related to this. He denies any changes in his voice and difficulty breathing.   The pt notes that he is finishing three months of PT tomorrow after surgeries on his right leg and left knee. He notes that he takes blood thinners for Afib and uses his CPAP regularly now. The pt also receives testosterone injections, 200mg  every other week. The pt notes that he had several surgeries bilaterally to remove large kidney stones in the past. He notes that his thyroid replacement has been stable. His vertebral compression fracture was in 2013, after a fall in which he also broke his right femur and wrist. The pt also takes Prolia every 6 months for his osteoporosis. He has had 18 surgeries on his right leg.   He notes that his psoriasis has been stable and takes Enbrel twice a week, and has taken this for 5 years. He was on 10 years of Humara before this. He was on Methotrexate prior to [facility].   The pt  notes that he functions okay at home and is able to drive himself and cook for himself and feels that he can function independently with limitations on lifting heavier objects.   On review of systems, pt reports recent cough, left tonsil mass, stable energy levels, and denies fevers, chills, night sweats, unexpected weight loss, difficulty breathing, difficulty swallowing, noticing any other lumps or bumps, pain along the spine, recent infections, abdominal pains, difficulty urinating, leg swelling, and any other symptoms.  . On Social Hx the pt reports that he smoked cigarettes for many years until he quit 6 years ago, and drinks ETOH socially.    INTERVAL HISTORY   [name] is here for management and evaluation, after completing 6 planned cycles of R-CHOP treatment of his Diffuse Large B-Cell Lymphoma. The patient's last visit with [place] was on 02/01/2019. The pt reports that he is doing well overall.  The pt reports that he had a kidney stone removed since his last visit so he had to stop walking for a while. He is getting back into his walking routine now. He drank a lot of soda in the past, so now he tries to limit his soda intake to 16 oz daily. He drinks a lot of water and has been eating well. He has some occasional falls because he sometimes loses his balance.  The pt notes that everything is okay at home but that he has been missing socialization. However, he has been avoiding crowds and staying home because he does  not want to end up in the hospital.  He notes that the [place] is keeping his psoriasis away. He has been taking depression medication for a long time and has been managing it well. He sees a psychiatrist and is very happy with the relationship that they have.  Lab results today (05/03/2019) of CBC w/diff and CMP is as follows: all values are WNL except for HGB at 12.5, glucose at 137, BUN at 31, Creatinine at 1.27, GFR at 56 05/03/2019 LDH is 176  On review of systems,  pt reports a recent kidney stone, eating well, intentional weight loss and denies any other symptoms.   MEDICAL HISTORY:  Past Medical History:  Diagnosis Date  . A-fib (HCC)    hx.- sinus rhythm now  . Acid reflux   . AKI (acute kidney injury) (HCC)    mild  . Anxiety   . Arthritis   . Atrial dilatation, bilateral    Severly, enlargement  per ECHO 11/03/16  . BPH (benign prostatic hyperplasia)   . CAD (coronary artery disease)   . Cancer ([place])   . Depression   . Dislocation of internal right hip prosthesis, sequela   . Dysrhythmia    hx a fib  . Elevated brain natriuretic peptide (BNP) level   . Environmental allergies   . Fracture of wrist 10/23/13   LEFT  . High cholesterol   . History of Graves' disease   . History of kidney stones    surg. removal 02/2017  . History of syncope   . HLD (hyperlipidemia)   . Hypertension   . Hypothyroidism    thyroid removed  . Left knee injury    cap dislocated  . Low testosterone, possible hypogonadism 10/01/2012  . Lumbago   . Metabolic bone disease   . Multiple falls   . Osteoporosis    Severe  . Other abnormalities of gait and mobility   . Plaque psoriasis   . Sleep apnea    cpap machine-settings 17  . Thrombocytopenia ([place])   . Thyroid disease    HX GRAVES DISEASE  . Transfusion history    s/p 12'13 hip surgery  . Unsteady gait   . UTI (urinary tract infection)   . Vertebral compression fracture (HCC)    L1-wears brace    SURGICAL HISTORY: Past Surgical History:  Procedure Laterality Date  . APPENDECTOMY    . CARDIAC CATHETERIZATION  2013  . CATARACT EXTRACTION, BILATERAL    . COLONOSCOPY    . CYSTOSCOPY WITH RETROGRADE PYELOGRAM, URETEROSCOPY AND STENT PLACEMENT Left 11/07/2017   Procedure: CYSTOSCOPY WITH RETROGRADE PYELOGRAM, URETEROSCOPY AND STENT PLACEMENT;  Surgeon: Cleon Gustin, MD;  Location: [facility];  Service: Urology;  Laterality: Left;  . CYSTOSCOPY/URETEROSCOPY/HOLMIUM LASER/STENT PLACEMENT Right  03/11/2017   Procedure: CYSTOSCOPY/URETEROSCOPYSTENT PLACEMENT right ureter retrograde pylegram;  Surgeon: Cleon Gustin, MD;  Location: [facility];  Service: Urology;  Laterality: Right;  . HARDWARE REMOVAL  10/05/2012   Procedure: HARDWARE REMOVAL;  Surgeon: [name], MD;  [facility]: [facility];  Service: Orthopedics;  Laterality: Right;  REMOVING  STRYKER  GAMMA NAIL  . HARDWARE REMOVAL Right 07/03/2013   Procedure: HARDWARE REMOVAL RIGHT TIBIA ;  Surgeon: Rozanna Box, MD;  Location: [place];  Service: Orthopedics;  Laterality: Right;  . HIP CLOSED REDUCTION Right 10/15/2013   Procedure: CLOSED MANIPULATION HIP;  Surgeon: [name], MD;  [facility];  Service: Orthopedics;  Laterality: Right;  . hip revision  June 2017  . HOLMIUM LASER APPLICATION Right 12/17/2503   Procedure: HOLMIUM LASER APPLICATION;  Surgeon: Cleon Gustin, MD;  [facility];  Service: Urology;  Laterality: Right;  . HOLMIUM LASER APPLICATION Left 3/97/6734   Procedure: HOLMIUM LASER APPLICATION;  Surgeon: Cleon Gustin, MD;  [facility];  Service: Urology;  Laterality: Left;  . HOLMIUM LASER APPLICATION Left 10/19/3788   Procedure: HOLMIUM LASER APPLICATION;  Surgeon: Cleon Gustin, MD;  [facility];  Service: Urology;  Laterality: Left;  . INCISION AND DRAINAGE HIP Right 11/16/2013   Procedure: IRRIGATION AND DEBRIDEMENT RIGHT HIP;  Surgeon: [name], MD;  [facility];  Service: Orthopedics;  Laterality: Right;  . IR IMAGING GUIDED PORT INSERTION  06/22/2018  . IR NEPHROSTOMY PLACEMENT LEFT  03/28/2019  . IR URETERAL STENT LEFT NEW ACCESS W/O SEP NEPHROSTOMY CATH  10/24/2017  . IR URETERAL STENT LEFT NEW ACCESS W/O SEP NEPHROSTOMY CATH  03/26/2019  . JOINT REPLACEMENT     hip-right x2  . KNEE SURGERY Bilateral 2012   Total knee replacements  . NEPHROLITHOTOMY Right 02/08/2017   Procedure: NEPHROLITHOTOMY PERCUTANEOUS WITH SURGEON ACCESS;  Surgeon: Cleon Gustin, MD;   [facility];  Service: Urology;  Laterality: Right;  . NEPHROLITHOTOMY Left 10/24/2017   Procedure: NEPHROLITHOTOMY PERCUTANEOUS;  Surgeon: Cleon Gustin, MD;  [facility];  Service: Urology;  Laterality: Left;  . NEPHROLITHOTOMY Left 03/26/2019   Procedure: NEPHROLITHOTOMY PERCUTANEOUS;  Surgeon: Cleon Gustin, MD;  [facility];  Service: Urology;  Laterality: Left;  2 HRS  . NEPHROLITHOTOMY Left 04/17/2019   Procedure: NEPHROLITHOTOMY PERCUTANEOUS;  Surgeon: Cleon Gustin, MD;  [facility];  Service: Urology;  Laterality: Left;  2 HRS  . ORIF TIBIA FRACTURE Right 02/06/2013   Procedure: OPEN REDUCTION INTERNAL FIXATION (ORIF) TIBIA FRACTURE WITH IM ROD FIBULA;  Surgeon: Rozanna Box, MD;  Location: [place];  Service: Orthopedics;  Laterality: Right;  . ORIF TIBIA FRACTURE Right 07/03/2013   Procedure: RIGHT TIBIA NON UNION REPAIR ;  Surgeon: Rozanna Box, MD;  Location: [place];  Service: Orthopedics;  Laterality: Right;  . ORIF WRIST FRACTURE  10/02/2012   Procedure: OPEN REDUCTION INTERNAL FIXATION (ORIF) WRIST FRACTURE;  Surgeon: Roseanne Kaufman, MD;  [facility];  Service: Orthopedics;  Laterality: Right;  WITH   ANTIBIOTIC  CEMENT  . ORIF WRIST FRACTURE Left 10/28/2013   Procedure: OPEN REDUCTION INTERNAL FIXATION (ORIF) WRIST FRACTURE with allograft;  Surgeon: Roseanne Kaufman, MD;  [facility];  Service: Orthopedics;  Laterality: Left;  DVR Plate  . QUADRICEPS TENDON REPAIR Left 07/15/2017   Procedure: REPAIR QUADRICEP TENDON;  Surgeon: Frederik Pear, MD;  Location: [place];  Service: Orthopedics;  Laterality: Left;  . right femur surgery  05/2012  . THYROIDECTOMY  02/1986  . TOTAL HIP REVISION  10/05/2012   Procedure: TOTAL HIP REVISION;  Surgeon: [name], MD;  [facility];  Service: Orthopedics;  Laterality: Right;  RIGHT TOTAL HIP REVISION  . TOTAL HIP REVISION Right 09/17/2013   Procedure: REVISION RIGHT TOTAL HIP ARTHROPLASTY ;   Surgeon: [name], MD;  [facility];  Service: Orthopedics;  Laterality: Right;  . TOTAL HIP REVISION Right 10/26/2013   Procedure: REVISION RIGHT TOTAL HIP ARTHROPLASTY;  Surgeon: [name], MD;  [facility];  Service: Orthopedics;  Laterality: Right;  . TOTAL HIP REVISION  03/2016  . TOTAL KNEE REVISION Left 04/11/2017  Procedure: TOTAL KNEE REVISION PATELLA and TIBIA;  Surgeon: Frederik Pear, MD;  Location: [place];  Service: Orthopedics;  Laterality: Left;  . WRIST FRACTURE SURGERY  05/2012    SOCIAL HISTORY: Social History   Socioeconomic History  . Marital status: Married    Spouse name: Not on file  . Number of children: Not on file  . Years of education: Not on file  . Highest education level: Not on file  Occupational History  . Not on file  Social Needs  . Financial resource strain: Not on file  . Food insecurity    Worry: Not on file    Inability: Not on file  . Transportation needs    Medical: Not on file    Non-medical: Not on file  Tobacco Use  . Smoking status: Former Smoker    Quit date: 06/05/2012    Years since quitting: 6.9  . Smokeless tobacco: Never Used  Substance and Sexual Activity  . Alcohol use: Yes    Comment: occasional-social  . Drug use: No  . Sexual activity: Yes  Lifestyle  . Physical activity    Days per week: Not on file    Minutes per session: Not on file  . Stress: Not on file  Relationships  . Social Herbalist on phone: Not on file    Gets together: Not on file    Attends religious service: Not on file    Active member of club or organization: Not on file    Attends meetings of clubs or organizations: Not on file    Relationship status: Not on file  . Intimate partner violence    Fear of current or ex partner: Not on file    Emotionally abused: Not on file    Physically abused: Not on file    Forced sexual activity: Not on file  Other Topics Concern  . Not on file  Social History Narrative    [place] 2.5 months, went home Feb 21st slipped and fell on back and developed.  Home PT/OT.  Just started outpatient physical therapy.  Friday night, misstepped.      FAMILY HISTORY: Family History  Problem Relation Age of Onset  . CAD Father 3  . Asthma Father   . Alcohol abuse Father   . Arthritis Mother   . Alcohol abuse Sister     ALLERGIES:  is allergic to short ragweed pollen ext.  MEDICATIONS:  Current Outpatient Medications  Medication Sig Dispense Refill  . acetaminophen (TYLENOL) 500 MG tablet Take 1,000 mg by mouth daily as needed for moderate pain.    [name] albuterol (PROVENTIL) (2.5 MG/3ML) 0.083% nebulizer solution Take 3 mLs (2.5 mg total) by nebulization every 4 (four) hours as needed for wheezing or shortness of breath. 75 mL 12  . alfuzosin (UROXATRAL) 10 MG 24 hr tablet Take 10 mg by mouth at bedtime.    [name] atorvastatin (LIPITOR) 80 MG tablet Take 1 tablet (80 mg total) by mouth daily. 90 tablet 1  . buPROPion (WELLBUTRIN XL) 300 MG 24 hr tablet TAKE 1 TABLET DAILY WITH  BREAKFAST BY MOUTH. (Patient taking differently: Take 300 mg by mouth daily. ) 90 tablet 3  . Calcium Citrate-Vitamin D (CALCIUM CITRATE + D PO) Take 1 tablet by mouth 2 (two) times daily.     . Cholecalciferol (VITAMIN D-3 PO) Take 2,000 Units by mouth 2 (two) times daily.     . clonazePAM (KLONOPIN) 1 MG tablet Take 1 tablet (  1 mg total) by mouth 3 (three) times daily as needed for anxiety. TAKE 1 TABLET BY MOUTH  EVERY 6 HOURS prn (Patient taking differently: Take 1 mg by mouth every 6 (six) hours as needed for anxiety. ) 5 tablet 1  . denosumab (PROLIA) 60 MG/ML SOSY injection Inject 60 mg as directed every 6 (six) months. Last injection: Feb 2020 Next injection: August 2020    . diltiazem (CARDIZEM CD) 240 MG 24 hr capsule TAKE 1 CAPSULE BY MOUTH  DAILY (Patient taking differently: Take 240 mg by mouth daily. ) 90 capsule 3  . doxycycline (VIBRA-TABS) 100 MG tablet Take 1 tablet (100 mg total) by mouth  daily. 90 tablet 3  . DULoxetine (CYMBALTA) 60 MG capsule TAKE 1 CAPSULE BY MOUTH  TWICE DAILY (Patient taking differently: Take 60 mg by mouth 2 (two) times daily. ) 180 capsule 1  . EPINEPHrine (EPIPEN 2-PAK) 0.3 mg/0.3 mL IJ SOAJ injection Inject 0.3 mg into the muscle daily as needed (for anaphylactic reaction).    . ezetimibe (ZETIA) 10 MG tablet Take 1 tablet (10 mg total) by mouth daily. 90 tablet 3  . ferrous sulfate 325 (65 FE) MG tablet Take 325 mg by mouth 2 (two) times daily with a meal.    . HYDROcodone-acetaminophen (NORCO) 7.5-325 MG tablet Take 1 tablet by mouth 4 (four) times daily as needed for moderate pain. Please fill on 04/06/19 120 tablet 0  . HYDROcodone-acetaminophen (NORCO) 7.5-325 MG tablet Take 1 tablet by mouth 4 (four) times daily as needed for moderate pain. (Patient not taking: Reported on 03/14/2019) 120 tablet 0  . HYDROcodone-acetaminophen (NORCO) 7.5-325 MG tablet Take 1 tablet by mouth every 6 (six) hours as needed for severe pain. 120 tablet 0  . levothyroxine (SYNTHROID, LEVOTHROID) 200 MCG tablet TAKE 1 TABLET DAILY AT 6 AM BY MOUTH. (Patient taking differently: Take 200 mcg by mouth daily before breakfast. ) 90 tablet 3  . levothyroxine (SYNTHROID, LEVOTHROID) 25 MCG tablet TAKE 1 TABLET BY MOUTH  DAILY BEFORE BREAKFAST. (Patient taking differently: Take 25 mcg by mouth daily before breakfast. ) 90 tablet 3  . loratadine (CLARITIN) 10 MG tablet Take 10 mg by mouth daily.     . Magnesium 500 MG CAPS Take 1 capsule (500 mg total) by mouth daily. 30 capsule 1  . metoprolol tartrate (LOPRESSOR) 25 MG tablet TAKE 1 TABLET BY MOUTH TWO  TIMES DAILY 180 tablet 2  . mirabegron ER (MYRBETRIQ) 25 MG TB24 tablet Take 25 mg by mouth at bedtime.     . Multiple Vitamin (MULTIVITAMIN WITH MINERALS) TABS tablet Take 1 tablet by mouth daily. Men's One-A-Day 41+    . nitroGLYCERIN (NITROSTAT) 0.4 MG SL tablet Place 0.4 mg under the tongue every 5 (five) minutes as needed for chest  pain. x3 doses as needed for chest pain    . omeprazole (PRILOSEC) 40 MG capsule TAKE 1 CAPSULE DAILY BEFORE BREAKFAST BY MOUTH. (Patient taking differently: Take 40 mg by mouth daily. ) 90 capsule 3  . ondansetron (ZOFRAN) 4 MG tablet Take 1 tablet (4 mg total) by mouth every 6 (six) hours as needed for nausea. 20 tablet 0  . OTEZLA 30 MG TABS Take 30 mg by mouth 2 (two) times a day.    . oxyCODONE-acetaminophen (PERCOCET) 5-325 MG tablet Take 1 tablet by mouth every 4 (four) hours as needed for moderate pain or severe pain. 30 tablet 0  . Probiotic Product (PROBIOTIC PO) Take 1 capsule by mouth  daily with breakfast.    . prochlorperazine (COMPAZINE) 10 MG tablet Take 1 tablet (10 mg total) by mouth every 6 (six) hours as needed (Nausea or vomiting). (Patient not taking: Reported on 11/08/2018) 30 tablet 6  . testosterone enanthate (DELATESTRYL) 200 MG/ML injection INJECT 1ML (200MG ) INTO THE MUSCLE EVERY 14 DAYS. DISCARD VIAL AFTER 28 DAYS. (Patient taking differently: Inject 200 mg into the muscle every 14 (fourteen) days. Last injection: 03/10/2019) 5 mL 3  . tiZANidine (ZANAFLEX) 4 MG tablet TAKE 1 TABLET BY MOUTH  EVERY 6 HOURS AS NEEDED FOR MUSCLE SPASM(S) (Patient taking differently: Take 4 mg by mouth every 6 (six) hours as needed for muscle spasms. ) 360 tablet 1  . vitamin C (ASCORBIC ACID) 500 MG tablet Take 500 mg by mouth daily.    Alveda Reasons 10 MG TABS tablet TAKE 1 TABLET DAILY BY  MOUTH. (Patient taking differently: Take 10 mg by mouth daily. ) 90 tablet 3   No current facility-administered medications for this visit.     REVIEW OF SYSTEMS:    A 10+ POINT REVIEW OF SYSTEMS WAS OBTAINED including neurology, dermatology, psychiatry, cardiac, respiratory, lymph, extremities, GI, GU, Musculoskeletal, constitutional, breasts, reproductive, HEENT.  All pertinent positives are noted in the HPI.  All others are negative.   PHYSICAL EXAMINATION: ECOG PERFORMANCE STATUS: 2 - Symptomatic, <50%  confined to bed  . Vitals:   05/03/19 1058  BP: 135/79  Pulse: 88  Resp: 18  Temp: (!) 97.1 F (36.2 C)  SpO2: 96%   Filed Weights   05/03/19 1058  Weight: 291 lb 8 oz (132.2 kg)   .Body mass index is 36.44 kg/m.  GENERAL:alert, in no acute distress and comfortable SKIN: chronic venous stasis changes in BLE, psoriasis rashes on elbows have improved with Otezla  EYES: conjunctiva are pink and non-injected, sclera anicteric OROPHARYNX: MMM, no exudates, no oropharyngeal erythema or ulceration NECK: supple, no JVD LYMPH:  no palpable lymphadenopathy in the cervical, axillary or inguinal regions LUNGS: clear to auscultation b/l with normal respiratory effort HEART: regular rate & rhythm ABDOMEN:  normoactive bowel sounds , non tender, not distended. No palpable hepatosplenomegaly.  Extremity: no pedal edema PSYCH: alert & oriented x 3 with fluent speech NEURO: no focal motor/sensory deficits   LABORATORY DATA:  I have reviewed the data as listed  . CBC Latest Ref Rng & Units 05/03/2019 04/18/2019 04/17/2019  WBC 4.0 - 10.5 K/uL 5.8 6.9 5.0  Hemoglobin 13.0 - 17.0 g/dL 12.5(L) 11.5(L) 12.2(L)  Hematocrit 39.0 - 52.0 % 39.5 38.1(L) 38.8(L)  Platelets 150 - 400 K/uL 169 166 167    . CMP Latest Ref Rng & Units 05/03/2019 04/18/2019 04/17/2019  Glucose 70 - 99 mg/dL 137(H) 99 115(H)  BUN 8 - 23 mg/dL 31(H) 23 25(H)  Creatinine 0.61 - 1.24 mg/dL 1.27(H) 1.06 1.18  Sodium 135 - 145 mmol/L 142 136 139  Potassium 3.5 - 5.1 mmol/L 4.4 4.1 4.1  Chloride 98 - 111 mmol/L 104 104 104  CO2 22 - 32 mmol/L 29 26 28   Calcium 8.9 - 10.3 mg/dL 9.7 8.6(L) 8.7(L)  Total Protein 6.5 - 8.1 g/dL 7.0 - -  Total Bilirubin 0.3 - 1.2 mg/dL 0.5 - -  Alkaline Phos 38 - 126 U/L 71 - -  AST 15 - 41 U/L 24 - -  ALT 0 - 44 U/L 28 - -   . Lab Results  Component Value Date   LDH 176 05/03/2019    05/29/18 Left  tonsil biopsy:   RADIOGRAPHIC STUDIES: I have personally reviewed the radiological images  as listed and agreed with the findings in the report. Dg C-arm 1-60 Min-no Report  Result Date: 04/17/2019 Fluoroscopy was utilized by the requesting physician.  No radiographic interpretation.    ASSESSMENT & PLAN:  73 y.o. male with  1. Diffuse Large B-Cell Lymphoma,- Stage III Left tonsil -05/29/18 Left tonsil biopsy which revealed Diffuse Large B-Cell Lymphoma, which requires treatment  -06/07/18 LDH at 214, will monitor change through treatment.  -Baseline ECHO 06/14/18: EF 50-55% -Standard regimen of R-CHOP with G-CSF support for 6 cycles starting 06/23/18  06/16/18 PET/CT revealed 1. Deauville 5 hypermetabolic lesions in the palatine tonsils, left internal jugular chain, and involving a right inguinal lymph node. Deauville 4 involvement of a left inguinal lymph node and an enlarged subcutaneous lesion favoring lymph node superficial to the lower right sternocleidomastoid muscle in the neck. 2. Other imaging findings of potential clinical significance: Aortic Atherosclerosis. Coronary atherosclerosis. Emphysema. Bilateral nonobstructive nephrolithiasis. Umbilical hernia contains adipose tissues. Old right rib fractures some of which are nonunited. 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   08/22/18 PET/CT revealed Interval response to therapy. Overall Deauville criteria 3. Interval decrease in size and FDG uptake associated with palatine tonsil lesions. There has been resolution of previous left level-II and bilateral inguinal lymph nodes. 2. Enlarged subcutaneous lesion superficial to the lower right sternocleidomastoid muscle exhibits decreased FDG uptake. Currently Deauville criteria 2. 3. No new or progressive sites of disease identified. 4.  Aortic Atherosclerosis. 5. Multi vessel coronary artery atherosclerotic calcifications. 6. Unchanged vertebra plana deformity. 7. Kidney stones.   S/p 6 cycles of R-CHOP completed on 10/12/18  11/22/18 PET/CT revealed The tonsillar mass has essentially resolved. The large  presumed lymph node superficial to the right sternocleidomastoid is stable in size at 2.3 cm in short axis, with maximum SUV of 3.3 (formerly 2.4), representing Deauville 3 activity, previously Deauville 2. 2. Other imaging findings of potential clinical significance: Aortic Atherosclerosis and Emphysema. Coronary atherosclerosis. Bilateral nonobstructive nephrolithiasis. Umbilical hernia contains adipose tissue. Chronic high activity posterior to the right hip implant along a region of bony irregularity and deformity probably reflecting chronic inflammation. L1 vertebra plana.  PLAN:  -Discussed pt labwork today, 05/03/2019; LDH has normalized, blood counts are normal. Pt is slightly dehydrated -Discussed the patient's hx of 3 kidney stones. Recommend that patient decreases his soda intake -Discussed side effects of Otezla such as depression and diarrhea. Recommend that the pt monitors his depression. -Recommend that the pt continue to eat well, drink at least 48-64 oz of water each day, and walk 20-30 minutes each day.  -The pt shows no clinical or lab progression/return of his DLBCL at this time.  -No indication for further treatment of his lymphoma at this time.  -Will continue to monitor the stable subcutaneous lymph node superficial to right lower sternocleidomastoid muscle, which did not appear to change in character throughout the extent of treatment. Pt will let me know if he senses any changes in this. -Continue following up with PT -Continue Otezla use per dermatology. -Will see the pt back in 3 months  2.  Patient Active Problem List   Diagnosis Date Noted  . H/O nephrolithotomy with removal of calculi 03/26/2019  . Renal calculus 03/26/2019  . Generalized weakness 11/08/2018  . Well adult exam 10/02/2018  . Counseling regarding advance care planning and goals of care 07/09/2018  . Diffuse large B-cell lymphoma ([place]) 06/23/2018  . Port-A-Cath in place 06/23/2018  .  Hypogonadism in  male 11/22/2017  . Nephrolithiasis 10/24/2017  . Recurrent dislocation of left patella 07/15/2017  . Recurrent subluxation of patella, left 07/08/2017  . S/P revision of total knee 04/11/2017  . Failed total knee, left, initial encounter ([place]) 04/09/2017  . Renal calculus, right 02/08/2017  . Protein-calorie malnutrition, severe ([place]) 12/08/2016  . Chronic obstructive pulmonary disease ([place]) 12/08/2016  . Staghorn renal calculus 11/05/2016  . Wound drainage right hip 11/15/2013  . Expected blood loss anemia 09/18/2013  . Hyponatremia 09/18/2013  . S/P right TH revision 09/17/2013  . Nonunion, fracture, Right tibia  07/03/2013  . UTI (urinary tract infection) 07/03/2013  . Pathological fracture due to osteoporosis with delayed healing 04/24/2013  . Osteoporosis with fracture 04/24/2013  . Rash and nonspecific skin eruption 03/08/2013  . Syncope 02/21/2013  . PAF (paroxysmal atrial fibrillation) ([place]) 02/21/2013  . Chronic anticoagulation 02/21/2013  . Fall 02/08/2013  . Physical deconditioning 02/08/2013  . Low back pain radiating to both legs 02/08/2013  . Compression fracture of L1 lumbar vertebra (HCC) 02/08/2013  . Right tibial fracture 02/05/2013  . Obesity, unspecified 02/05/2013  . Osteomyelitis of right wrist ([place]) 11/02/2012  . Anemia 10/06/2012  . HTN (hypertension) 10/03/2012  . Psoriasis 10/03/2012  . Dyslipidemia 10/03/2012  . Vitamin D deficiency 10/03/2012  . GERD (gastroesophageal reflux disease) 10/03/2012  . Hyperglycemia 10/03/2012  . Anxiety 10/03/2012  . Depression 10/03/2012  . CAD (coronary artery disease) 10/03/2012  . DJD (degenerative joint disease) 10/03/2012  . Chronic gingivitis 10/03/2012  . Low testosterone, possible hypogonadism 10/01/2012  . Normocytic anemia 09/30/2012  . Right femoral fracture ([place]) 09/29/2012  . Right forearm fracture 09/29/2012  . Atrial fibrillation ([place]) 09/29/2012  . Hypothyroidism 09/29/2012  . History of  recurrent UTIs 09/29/2012   -continue mx of medical co-morbidities with PCP   RTC with Dr Irene Limbo with labs in 3 months   All of the patients questions were answered with apparent satisfaction. The patient knows to call the clinic with any problems, questions or concerns.  The total time spent in the appt was 20 minutes and more than 50% was on counseling and direct patient cares.  [place] Lone MD MS AAHIVMS [facility] Hematology/Oncology Physician [facility]  (Office):       [phone] (Work cell):  [phone] (Fax):           [phone]  05/03/2019 12:09 PM  I, De Burrs, am acting as a scribe for Dr. Irene Limbo  .I have reviewed the above documentation for accuracy and completeness, and I agree with the above. Brunetta Genera MD

## 2019-05-03 ENCOUNTER — Telehealth: Payer: Self-pay | Admitting: Hematology

## 2019-05-03 ENCOUNTER — Other Ambulatory Visit: Payer: Self-pay

## 2019-05-03 ENCOUNTER — Inpatient Hospital Stay (HOSPITAL_BASED_OUTPATIENT_CLINIC_OR_DEPARTMENT_OTHER): Payer: Medicare Other | Admitting: Hematology

## 2019-05-03 ENCOUNTER — Inpatient Hospital Stay: Payer: Medicare Other | Attending: Hematology

## 2019-05-03 VITALS — BP 135/79 | HR 88 | Temp 97.1°F | Resp 18 | Ht 75.0 in | Wt 291.5 lb

## 2019-05-03 DIAGNOSIS — E860 Dehydration: Secondary | ICD-10-CM | POA: Diagnosis not present

## 2019-05-03 DIAGNOSIS — C8330 Diffuse large B-cell lymphoma, unspecified site: Secondary | ICD-10-CM

## 2019-05-03 DIAGNOSIS — Z8572 Personal history of non-Hodgkin lymphomas: Secondary | ICD-10-CM | POA: Diagnosis present

## 2019-05-03 LAB — CBC WITH DIFFERENTIAL/PLATELET
Abs Immature Granulocytes: 0.03 10*3/uL (ref 0.00–0.07)
Basophils Absolute: 0 10*3/uL (ref 0.0–0.1)
Basophils Relative: 1 %
Eosinophils Absolute: 0.2 10*3/uL (ref 0.0–0.5)
Eosinophils Relative: 3 %
HCT: 39.5 % (ref 39.0–52.0)
Hemoglobin: 12.5 g/dL — ABNORMAL LOW (ref 13.0–17.0)
Immature Granulocytes: 1 %
Lymphocytes Relative: 31 %
Lymphs Abs: 1.8 10*3/uL (ref 0.7–4.0)
MCH: 29.1 pg (ref 26.0–34.0)
MCHC: 31.6 g/dL (ref 30.0–36.0)
MCV: 91.9 fL (ref 80.0–100.0)
Monocytes Absolute: 0.4 10*3/uL (ref 0.1–1.0)
Monocytes Relative: 6 %
Neutro Abs: 3.5 10*3/uL (ref 1.7–7.7)
Neutrophils Relative %: 58 %
Platelets: 169 10*3/uL (ref 150–400)
RBC: 4.3 MIL/uL (ref 4.22–5.81)
RDW: 15.1 % (ref 11.5–15.5)
WBC: 5.8 10*3/uL (ref 4.0–10.5)
nRBC: 0 % (ref 0.0–0.2)

## 2019-05-03 LAB — CMP (CANCER CENTER ONLY)
ALT: 28 U/L (ref 0–44)
AST: 24 U/L (ref 15–41)
Albumin: 3.5 g/dL (ref 3.5–5.0)
Alkaline Phosphatase: 71 U/L (ref 38–126)
Anion gap: 9 (ref 5–15)
BUN: 31 mg/dL — ABNORMAL HIGH (ref 8–23)
CO2: 29 mmol/L (ref 22–32)
Calcium: 9.7 mg/dL (ref 8.9–10.3)
Chloride: 104 mmol/L (ref 98–111)
Creatinine: 1.27 mg/dL — ABNORMAL HIGH (ref 0.61–1.24)
GFR, Est AFR Am: >60 mL/min (ref >60)
GFR, Estimated: 56 mL/min — ABNORMAL LOW (ref >60)
Glucose, Bld: 137 mg/dL — ABNORMAL HIGH (ref 70–99)
Potassium: 4.4 mmol/L (ref 3.5–5.1)
Sodium: 142 mmol/L (ref 135–145)
Total Bilirubin: 0.5 mg/dL (ref 0.3–1.2)
Total Protein: 7 g/dL (ref 6.5–8.1)

## 2019-05-03 LAB — LACTATE DEHYDROGENASE: LDH: 176 U/L (ref 98–192)

## 2019-05-03 NOTE — Telephone Encounter (Signed)
Scheduled appt per 7/23 los.  Spoke with patient and he is aware of his appt date and time.

## 2019-05-08 ENCOUNTER — Other Ambulatory Visit: Payer: Self-pay | Admitting: Internal Medicine

## 2019-05-11 ENCOUNTER — Other Ambulatory Visit: Payer: Self-pay | Admitting: Internal Medicine

## 2019-05-11 ENCOUNTER — Other Ambulatory Visit: Payer: Self-pay | Admitting: Cardiovascular Disease

## 2019-05-30 ENCOUNTER — Ambulatory Visit (INDEPENDENT_AMBULATORY_CARE_PROVIDER_SITE_OTHER): Payer: Medicare Other | Admitting: Internal Medicine

## 2019-05-30 ENCOUNTER — Encounter: Payer: Self-pay | Admitting: Internal Medicine

## 2019-05-30 DIAGNOSIS — I2510 Atherosclerotic heart disease of native coronary artery without angina pectoris: Secondary | ICD-10-CM

## 2019-05-30 DIAGNOSIS — M545 Low back pain: Secondary | ICD-10-CM

## 2019-05-30 DIAGNOSIS — N200 Calculus of kidney: Secondary | ICD-10-CM | POA: Diagnosis not present

## 2019-05-30 DIAGNOSIS — N390 Urinary tract infection, site not specified: Secondary | ICD-10-CM | POA: Diagnosis not present

## 2019-05-30 DIAGNOSIS — I480 Paroxysmal atrial fibrillation: Secondary | ICD-10-CM | POA: Diagnosis not present

## 2019-05-30 DIAGNOSIS — M79605 Pain in left leg: Secondary | ICD-10-CM

## 2019-05-30 NOTE — Assessment & Plan Note (Addendum)
Norco prn [facility]

## 2019-05-30 NOTE — Assessment & Plan Note (Signed)
No angina 

## 2019-05-30 NOTE — Assessment & Plan Note (Signed)
No recent relapse 

## 2019-05-30 NOTE — Assessment & Plan Note (Signed)
s/p removal

## 2019-05-30 NOTE — Assessment & Plan Note (Signed)
On chronic abx per [facility]rol

## 2019-05-30 NOTE — Progress Notes (Signed)
Virtual Visit via Telephone Note  I connected with [name] on 05/30/19 at 10:40 AM EDT by telephone and verified that I am speaking with the correct person using two identifiers.   I discussed the limitations, risks, security and privacy concerns of performing an evaluation and management service by telephone and the availability of in person appointments. I also discussed with the patient that there may be a patient responsible charge related to this service. The patient expressed understanding and agreed to proceed.   History of Present Illness:  F/u chronic pain, UTIs/stones, cancer f/u    Observations/Objective: Sounds normal  Assessment and Plan:  See plan Follow Up Instructions:    I discussed the assessment and treatment plan with the patient. The patient was provided an opportunity to ask questions and all were answered. The patient agreed with the plan and demonstrated an understanding of the instructions.   The patient was advised to call back or seek an in-person evaluation if the symptoms worsen or if the condition fails to improve as anticipated.  I provided 11 minutes of non-face-to-face time during this encounter.   [place] Kehr, MD

## 2019-06-05 ENCOUNTER — Other Ambulatory Visit: Payer: Self-pay

## 2019-06-05 NOTE — Telephone Encounter (Signed)
Check [place] registry last filled 05/06/2019.Marland KitchenJohny Lane

## 2019-06-06 ENCOUNTER — Other Ambulatory Visit: Payer: Self-pay | Admitting: Internal Medicine

## 2019-06-06 MED ORDER — TESTOSTERONE ENANTHATE 200 MG/ML IM SOLN: INTRAMUSCULAR | 3 refills | Status: DC

## 2019-06-07 MED ORDER — HYDROCODONE-ACETAMINOPHEN 7.5-325 MG PO TABS: 1.0000 | ORAL_TABLET | Freq: Four times a day (QID) | ORAL | 0 refills | Status: DC | PRN

## 2019-06-09 ENCOUNTER — Encounter (HOSPITAL_BASED_OUTPATIENT_CLINIC_OR_DEPARTMENT_OTHER): Payer: Self-pay | Admitting: Emergency Medicine

## 2019-06-09 ENCOUNTER — Other Ambulatory Visit: Payer: Self-pay

## 2019-06-09 ENCOUNTER — Emergency Department (HOSPITAL_BASED_OUTPATIENT_CLINIC_OR_DEPARTMENT_OTHER)
Admission: EM | Admit: 2019-06-09 | Discharge: 2019-06-09 | Disposition: A | Discharge status: Home / Self Care | Payer: Medicare Other | Attending: Emergency Medicine | Admitting: Emergency Medicine

## 2019-06-09 ENCOUNTER — Emergency Department (HOSPITAL_BASED_OUTPATIENT_CLINIC_OR_DEPARTMENT_OTHER): Payer: Medicare Other

## 2019-06-09 DIAGNOSIS — L03115 Cellulitis of right lower limb: Secondary | ICD-10-CM | POA: Insufficient documentation

## 2019-06-09 DIAGNOSIS — R609 Edema, unspecified: Secondary | ICD-10-CM

## 2019-06-09 DIAGNOSIS — Z87891 Personal history of nicotine dependence: Secondary | ICD-10-CM | POA: Insufficient documentation

## 2019-06-09 DIAGNOSIS — I2510 Atherosclerotic heart disease of native coronary artery without angina pectoris: Secondary | ICD-10-CM | POA: Diagnosis not present

## 2019-06-09 DIAGNOSIS — W1830XA Fall on same level, unspecified, initial encounter: Secondary | ICD-10-CM | POA: Diagnosis not present

## 2019-06-09 DIAGNOSIS — Z96641 Presence of right artificial hip joint: Secondary | ICD-10-CM | POA: Diagnosis not present

## 2019-06-09 DIAGNOSIS — E039 Hypothyroidism, unspecified: Secondary | ICD-10-CM | POA: Diagnosis not present

## 2019-06-09 DIAGNOSIS — Z79899 Other long term (current) drug therapy: Secondary | ICD-10-CM | POA: Diagnosis not present

## 2019-06-09 DIAGNOSIS — Z96652 Presence of left artificial knee joint: Secondary | ICD-10-CM | POA: Diagnosis not present

## 2019-06-09 DIAGNOSIS — Y999 Unspecified external cause status: Secondary | ICD-10-CM | POA: Diagnosis not present

## 2019-06-09 DIAGNOSIS — R600 Localized edema: Secondary | ICD-10-CM | POA: Diagnosis not present

## 2019-06-09 DIAGNOSIS — I10 Essential (primary) hypertension: Secondary | ICD-10-CM | POA: Insufficient documentation

## 2019-06-09 DIAGNOSIS — Y929 Unspecified place or not applicable: Secondary | ICD-10-CM | POA: Insufficient documentation

## 2019-06-09 DIAGNOSIS — J449 Chronic obstructive pulmonary disease, unspecified: Secondary | ICD-10-CM | POA: Diagnosis not present

## 2019-06-09 DIAGNOSIS — Y939 Activity, unspecified: Secondary | ICD-10-CM | POA: Insufficient documentation

## 2019-06-09 DIAGNOSIS — Z7901 Long term (current) use of anticoagulants: Secondary | ICD-10-CM | POA: Diagnosis not present

## 2019-06-09 DIAGNOSIS — M79661 Pain in right lower leg: Secondary | ICD-10-CM | POA: Diagnosis present

## 2019-06-09 DIAGNOSIS — W19XXXA Unspecified fall, initial encounter: Secondary | ICD-10-CM

## 2019-06-09 MED ORDER — CEPHALEXIN 500 MG PO CAPS: 500.0000 mg | ORAL_CAPSULE | Freq: Four times a day (QID) | ORAL | 0 refills | Status: DC

## 2019-06-09 NOTE — ED Triage Notes (Signed)
He fell 2 weeks ago. Ongoing R leg swelling and pain.

## 2019-06-09 NOTE — ED Provider Notes (Signed)
[place] EMERGENCY DEPARTMENT Provider Note   CSN: [id number] Arrival date & time: 06/09/19  1144     History   Chief Complaint Chief Complaint  Patient presents with  . Fall    HPI [name] is a 73 y.o. male.     HPI Patient presents with right leg swelling and pain.  Fell around 2 weeks ago.  States the pain is moved around somewhat mostly around the knee medially.  States that the leg is now become more swollen.  States he has had previous surgeries on this knee.  No fevers.  No cough.  States it is now more red also.  No hip pain.  No head or neck pain.  No difficulty breathing. Past Medical History:  Diagnosis Date  . A-fib (HCC)    hx.- sinus rhythm now  . Acid reflux   . AKI (acute kidney injury) (HCC)    mild  . Anxiety   . Arthritis   . Atrial dilatation, bilateral    Severly, enlargement  per ECHO 11/03/16  . BPH (benign prostatic hyperplasia)   . CAD (coronary artery disease)   . Cancer ([place])   . Depression   . Dislocation of internal right hip prosthesis, sequela   . Dysrhythmia    hx a fib  . Elevated brain natriuretic peptide (BNP) level   . Environmental allergies   . Fracture of wrist 10/23/13   LEFT  . High cholesterol   . History of Graves' disease   . History of kidney stones    surg. removal 02/2017  . History of syncope   . HLD (hyperlipidemia)   . Hypertension   . Hypothyroidism    thyroid removed  . Left knee injury    cap dislocated  . Low testosterone, possible hypogonadism 10/01/2012  . Lumbago   . Metabolic bone disease   . Multiple falls   . Osteoporosis    Severe  . Other abnormalities of gait and mobility   . Plaque psoriasis   . Sleep apnea    cpap machine-settings 17  . Thrombocytopenia ([place])   . Thyroid disease    HX GRAVES DISEASE  . Transfusion history    s/p 12'13 hip surgery  . Unsteady gait   . UTI (urinary tract infection)   . Vertebral compression fracture (HCC)    L1-wears brace     Patient Active Problem List   Diagnosis Date Noted  . H/O nephrolithotomy with removal of calculi 03/26/2019  . Renal calculus 03/26/2019  . Generalized weakness 11/08/2018  . Well adult exam 10/02/2018  . Counseling regarding advance care planning and goals of care 07/09/2018  . Diffuse large B-cell lymphoma ([place]) 06/23/2018  . Port-A-Cath in place 06/23/2018  . Hypogonadism in male 11/22/2017  . Nephrolithiasis 10/24/2017  . Recurrent dislocation of left patella 07/15/2017  . Recurrent subluxation of patella, left 07/08/2017  . S/P revision of total knee 04/11/2017  . Failed total knee, left, initial encounter ([place]) 04/09/2017  . Renal calculus, right 02/08/2017  . Protein-calorie malnutrition, severe ([place]) 12/08/2016  . Chronic obstructive pulmonary disease ([place]) 12/08/2016  . Staghorn renal calculus 11/05/2016  . Wound drainage right hip 11/15/2013  . Expected blood loss anemia 09/18/2013  . Hyponatremia 09/18/2013  . S/P right TH revision 09/17/2013  . Nonunion, fracture, Right tibia  07/03/2013  . UTI (urinary tract infection) 07/03/2013  . Pathological fracture due to osteoporosis with delayed healing 04/24/2013  . Osteoporosis with fracture 04/24/2013  .  Rash and nonspecific skin eruption 03/08/2013  . Syncope 02/21/2013  . PAF (paroxysmal atrial fibrillation) ([place]) 02/21/2013  . Chronic anticoagulation 02/21/2013  . Fall 02/08/2013  . Physical deconditioning 02/08/2013  . Low back pain radiating to both legs 02/08/2013  . Compression fracture of L1 lumbar vertebra (HCC) 02/08/2013  . Right tibial fracture 02/05/2013  . Obesity, unspecified 02/05/2013  . Osteomyelitis of right wrist ([place]) 11/02/2012  . Anemia 10/06/2012  . HTN (hypertension) 10/03/2012  . Psoriasis 10/03/2012  . Dyslipidemia 10/03/2012  . Vitamin D deficiency 10/03/2012  . GERD (gastroesophageal reflux disease) 10/03/2012  . Hyperglycemia 10/03/2012  . Anxiety 10/03/2012  . Depression 10/03/2012   . CAD (coronary artery disease) 10/03/2012  . DJD (degenerative joint disease) 10/03/2012  . Chronic gingivitis 10/03/2012  . Low testosterone, possible hypogonadism 10/01/2012  . Normocytic anemia 09/30/2012  . Right femoral fracture ([place]) 09/29/2012  . Right forearm fracture 09/29/2012  . Atrial fibrillation ([place]) 09/29/2012  . Hypothyroidism 09/29/2012  . History of recurrent UTIs 09/29/2012    Past Surgical History:  Procedure Laterality Date  . APPENDECTOMY    . CARDIAC CATHETERIZATION  2013  . CATARACT EXTRACTION, BILATERAL    . COLONOSCOPY    . CYSTOSCOPY WITH RETROGRADE PYELOGRAM, URETEROSCOPY AND STENT PLACEMENT Left 11/07/2017   Procedure: CYSTOSCOPY WITH RETROGRADE PYELOGRAM, URETEROSCOPY AND STENT PLACEMENT;  Surgeon: Cleon Gustin, MD;  Location: [facility];  Service: Urology;  Laterality: Left;  . CYSTOSCOPY/URETEROSCOPY/HOLMIUM LASER/STENT PLACEMENT Right 03/11/2017   Procedure: CYSTOSCOPY/URETEROSCOPYSTENT PLACEMENT right ureter retrograde pylegram;  Surgeon: Cleon Gustin, MD;  Location: [facility];  Service: Urology;  Laterality: Right;  . HARDWARE REMOVAL  10/05/2012   Procedure: HARDWARE REMOVAL;  Surgeon: [name], MD;  Location: [facility];  Service: Orthopedics;  Laterality: Right;  REMOVING  STRYKER  GAMMA NAIL  . HARDWARE REMOVAL Right 07/03/2013   Procedure: HARDWARE REMOVAL RIGHT TIBIA ;  Surgeon: Rozanna Box, MD;  Location: [place];  Service: Orthopedics;  Laterality: Right;  . HIP CLOSED REDUCTION Right 10/15/2013   Procedure: CLOSED MANIPULATION HIP;  Surgeon: [name], MD;  [facility];  Service: Orthopedics;  Laterality: Right;  . hip revision     June 2017  . HOLMIUM LASER APPLICATION Right [id number]   Procedure: HOLMIUM LASER APPLICATION;  Surgeon: Cleon Gustin, MD;  [facility];  Service: Urology;  Laterality: Right;  . HOLMIUM LASER APPLICATION Left [id number]   Procedure: HOLMIUM LASER APPLICATION;  Surgeon: Cleon Gustin, MD;  [facility];  Service: Urology;  Laterality: Left;  . HOLMIUM LASER APPLICATION Left [id number]   Procedure: HOLMIUM LASER APPLICATION;  Surgeon: Cleon Gustin, MD;  [facility];  Service: Urology;  Laterality: Left;  . INCISION AND DRAINAGE HIP Right 11/16/2013   Procedure: IRRIGATION AND DEBRIDEMENT RIGHT HIP;  Surgeon: [name], MD;  [facility];  Service: Orthopedics;  Laterality: Right;  . IR IMAGING GUIDED PORT INSERTION  06/22/2018  . IR NEPHROSTOMY PLACEMENT LEFT  03/28/2019  . IR URETERAL STENT LEFT NEW ACCESS W/O SEP NEPHROSTOMY CATH  10/24/2017  . IR URETERAL STENT LEFT NEW ACCESS W/O SEP NEPHROSTOMY CATH  03/26/2019  . JOINT REPLACEMENT     hip-right x2  . KNEE SURGERY Bilateral 2012   Total knee replacements  . NEPHROLITHOTOMY Right 02/08/2017   Procedure: NEPHROLITHOTOMY PERCUTANEOUS WITH SURGEON ACCESS;  Surgeon: Cleon Gustin, MD;  [facility];  Service: Urology;  Laterality: Right;  . NEPHROLITHOTOMY  Left 10/24/2017   Procedure: NEPHROLITHOTOMY PERCUTANEOUS;  Surgeon: Cleon Gustin, MD;  [facility];  Service: Urology;  Laterality: Left;  . NEPHROLITHOTOMY Left 03/26/2019   Procedure: NEPHROLITHOTOMY PERCUTANEOUS;  Surgeon: Cleon Gustin, MD;  [facility];  Service: Urology;  Laterality: Left;  2 HRS  . NEPHROLITHOTOMY Left 04/17/2019   Procedure: NEPHROLITHOTOMY PERCUTANEOUS;  Surgeon: Cleon Gustin, MD;  [facility];  Service: Urology;  Laterality: Left;  2 HRS  . ORIF TIBIA FRACTURE Right 02/06/2013   Procedure: OPEN REDUCTION INTERNAL FIXATION (ORIF) TIBIA FRACTURE WITH IM ROD FIBULA;  Surgeon: Rozanna Box, MD;  Location: [place];  Service: Orthopedics;  Laterality: Right;  . ORIF TIBIA FRACTURE Right 07/03/2013   Procedure: RIGHT TIBIA NON UNION REPAIR ;  Surgeon: Rozanna Box, MD;  Location: [place];  Service: Orthopedics;  Laterality: Right;  . ORIF WRIST FRACTURE  10/02/2012   Procedure: OPEN  REDUCTION INTERNAL FIXATION (ORIF) WRIST FRACTURE;  Surgeon: Roseanne Kaufman, MD;  [facility];  Service: Orthopedics;  Laterality: Right;  WITH   ANTIBIOTIC  CEMENT  . ORIF WRIST FRACTURE Left 10/28/2013   Procedure: OPEN REDUCTION INTERNAL FIXATION (ORIF) WRIST FRACTURE with allograft;  Surgeon: Roseanne Kaufman, MD;  [facility];  Service: Orthopedics;  Laterality: Left;  DVR Plate  . QUADRICEPS TENDON REPAIR Left 07/15/2017   Procedure: REPAIR QUADRICEP TENDON;  Surgeon: Frederik Pear, MD;  Location: [place];  Service: Orthopedics;  Laterality: Left;  . right femur surgery  05/2012  . THYROIDECTOMY  02/1986  . TOTAL HIP REVISION  10/05/2012   Procedure: TOTAL HIP REVISION;  Surgeon: [name], MD;  [facility];  Service: Orthopedics;  Laterality: Right;  RIGHT TOTAL HIP REVISION  . TOTAL HIP REVISION Right 09/17/2013   Procedure: REVISION RIGHT TOTAL HIP ARTHROPLASTY ;  Surgeon: [name], MD;  [facility];  Service: Orthopedics;  Laterality: Right;  . TOTAL HIP REVISION Right 10/26/2013   Procedure: REVISION RIGHT TOTAL HIP ARTHROPLASTY;  Surgeon: [name], MD;  [facility];  Service: Orthopedics;  Laterality: Right;  . TOTAL HIP REVISION  03/2016  . TOTAL KNEE REVISION Left 04/11/2017   Procedure: TOTAL KNEE REVISION PATELLA and TIBIA;  Surgeon: Frederik Pear, MD;  Location: [place];  Service: Orthopedics;  Laterality: Left;  . WRIST FRACTURE SURGERY  05/2012        Home Medications    Prior to Admission medications   Medication Sig Start Date End Date Taking? Authorizing Provider  acetaminophen (TYLENOL) 500 MG tablet Take 1,000 mg by mouth daily as needed for moderate pain.    [provider]  albuterol (PROVENTIL) (2.5 MG/3ML) 0.083% nebulizer solution Take 3 mLs (2.5 mg total) by nebulization every 4 (four) hours as needed for wheezing or shortness of breath. 11/11/18   Elgergawy, Silver Huguenin, MD  alfuzosin (UROXATRAL) 10 MG 24 hr tablet Take 10 mg by  mouth at bedtime.    [provider]  atorvastatin (LIPITOR) 80 MG tablet TAKE 1 TABLET BY MOUTH  DAILY 05/14/19   Skeet Latch, MD  BELSOMRA 15 MG TABS TAKE 1 TABLET BY MOUTH  DAILY 05/08/19   Plotnikov, Evie Lacks, MD  buPROPion (WELLBUTRIN XL) 300 MG 24 hr tablet TAKE 1 TABLET BY MOUTH  DAILY WITH BREAKFAST 05/08/19   Plotnikov, Evie Lacks, MD  Calcium Citrate-Vitamin D (CALCIUM CITRATE + D PO) Take 1 tablet by mouth 2 (two) times daily.  [provider]  cephALEXin (KEFLEX) 500 MG capsule Take 1 capsule (500 mg total) by mouth 4 (four) times daily. 06/09/19   Davonna Belling, MD  Cholecalciferol (VITAMIN D-3 PO) Take 2,000 Units by mouth 2 (two) times daily.     [provider]  clonazePAM (KLONOPIN) 1 MG tablet TAKE 1 TABLET BY MOUTH  EVERY 6 HOURS AS NEEDED FOR ANXIETY 05/08/19   Plotnikov, Evie Lacks, MD  denosumab (PROLIA) 60 MG/ML SOSY injection Inject 60 mg as directed every 6 (six) months. Last injection: Feb 2020 Next injection: August 2020 12/10/15   [provider]  diltiazem (CARDIZEM CD) 240 MG 24 hr capsule TAKE 1 CAPSULE BY MOUTH  DAILY 05/13/19   Plotnikov, Evie Lacks, MD  doxycycline (VIBRA-TABS) 100 MG tablet Take 1 tablet (100 mg total) by mouth daily. 03/07/19   Plotnikov, Evie Lacks, MD  DULoxetine (CYMBALTA) 60 MG capsule TAKE 1 CAPSULE BY MOUTH  TWICE DAILY 05/13/19   Plotnikov, Evie Lacks, MD  EPINEPHrine (EPIPEN 2-PAK) 0.3 mg/0.3 mL IJ SOAJ injection Inject 0.3 mg into the muscle daily as needed (for anaphylactic reaction).    [provider]  ezetimibe (ZETIA) 10 MG tablet TAKE 1 TABLET BY MOUTH  DAILY 05/14/19   Skeet Latch, MD  ferrous sulfate 325 (65 FE) MG tablet Take 325 mg by mouth 2 (two) times daily with a meal.    [provider]  HYDROcodone-acetaminophen (NORCO) 7.5-325 MG tablet Take 1 tablet by mouth 4 (four) times daily as needed for moderate pain. Please fill on 04/06/19 03/07/19   Plotnikov, Evie Lacks, MD   HYDROcodone-acetaminophen (NORCO) 7.5-325 MG tablet Take 1 tablet by mouth every 6 (six) hours as needed for severe pain. 03/07/19   Plotnikov, Evie Lacks, MD  HYDROcodone-acetaminophen (NORCO) 7.5-325 MG tablet Take 1 tablet by mouth 4 (four) times daily as needed for moderate pain. 06/07/19   Plotnikov, Evie Lacks, MD  levothyroxine (SYNTHROID) 200 MCG tablet TAKE 1 TABLET BY MOUTH  DAILY AT 6 AM 05/08/19   Plotnikov, Evie Lacks, MD  levothyroxine (SYNTHROID) 25 MCG tablet TAKE 1 TABLET BY MOUTH  DAILY BEFORE BREAKFAST 05/08/19   Plotnikov, Evie Lacks, MD  loratadine (CLARITIN) 10 MG tablet Take 10 mg by mouth daily.     [provider]  Magnesium 500 MG CAPS Take 1 capsule (500 mg total) by mouth daily. 10/13/18   Brunetta Genera, MD  metoprolol tartrate (LOPRESSOR) 25 MG tablet TAKE 1 TABLET BY MOUTH TWO  TIMES DAILY 04/15/19   Skeet Latch, MD  mirabegron ER (MYRBETRIQ) 25 MG TB24 tablet Take 25 mg by mouth at bedtime.     [provider]  Multiple Vitamin (MULTIVITAMIN WITH MINERALS) TABS tablet Take 1 tablet by mouth daily. Men's One-A-Day 50+    [provider]  nitroGLYCERIN (NITROSTAT) 0.4 MG SL tablet Place 0.4 mg under the tongue every 5 (five) minutes as needed for chest pain. x3 doses as needed for chest pain    [provider]  omeprazole (PRILOSEC) 40 MG capsule TAKE 1 CAPSULE BY MOUTH  DAILY BEFORE BREAKFAST 05/08/19   Plotnikov, Evie Lacks, MD  ondansetron (ZOFRAN) 4 MG tablet Take 1 tablet (4 mg total) by mouth every 6 (six) hours as needed for nausea. 11/11/18   Elgergawy, Silver Huguenin, MD  OTEZLA 30 MG TABS Take 30 mg by mouth 2 (two) times a day. 01/24/19   [provider]  oxyCODONE-acetaminophen (PERCOCET) 5-325 MG tablet Take 1 tablet by mouth every  4 (four) hours as needed for moderate pain or severe pain. 04/18/19 04/17/20  Cleon Gustin, MD  Probiotic Product (PROBIOTIC PO) Take 1 capsule by mouth daily with breakfast.    [provider]  prochlorperazine (COMPAZINE) 10 MG tablet Take 1 tablet (10 mg total) by mouth every 6 (six) hours as needed (Nausea or vomiting). Patient not taking: Reported on 11/08/2018 06/23/18   Brunetta Genera, MD  testosterone enanthate (DELATESTRYL) 200 MG/ML injection INJECT 1ML (200MG ) INTO THE MUSCLE EVERY 14 DAYS. DISCARD VIAL AFTER 28 DAYS. 06/06/19   Plotnikov, Evie Lacks, MD  tiZANidine (ZANAFLEX) 4 MG tablet TAKE 1 TABLET BY MOUTH  EVERY 6 HOURS AS NEEDED FOR MUSCLE SPASM(S) Patient taking differently: Take 4 mg by mouth every 6 (six) hours as needed for muscle spasms.  08/31/18   Plotnikov, Evie Lacks, MD  vitamin C (ASCORBIC ACID) 500 MG tablet Take 500 mg by mouth daily.    [provider]  XARELTO 10 MG TABS tablet TAKE 1 TABLET BY MOUTH  DAILY 05/08/19   Plotnikov, Evie Lacks, MD    Family History Family History  Problem Relation Age of Onset  . CAD Father 60  . Asthma Father   . Alcohol abuse Father   . Arthritis Mother   . Alcohol abuse Sister     Social History Social History   Tobacco Use  . Smoking status: Former Smoker    Quit date: 06/05/2012    Years since quitting: 7.0  . Smokeless tobacco: Never Used  Substance Use Topics  . Alcohol use: Yes    Comment: occasional-social  . Drug use: No     Allergies   Short ragweed pollen ext   Review of Systems Review of Systems  Constitutional: Negative for appetite change.  HENT: Negative for congestion.   Respiratory: Negative for shortness of breath.   Cardiovascular: Positive for leg swelling.  Gastrointestinal: Negative for abdominal pain.  Genitourinary: Negative for flank pain.  Musculoskeletal: Negative for back pain.       Right lower extremity pain.  Skin: Positive for color change.  Neurological: Negative for weakness.  Psychiatric/Behavioral: Negative for confusion.     Physical Exam Updated Vital Signs BP (!) 154/69 (BP Location: Right Arm)   Pulse 79   Temp 99.1 F (37.3  C) (Oral)   Resp 20   Ht 6\' 3"  (1.905 m)   Wt 132.5 kg   SpO2 95%   BMI 36.50 kg/m   Physical Exam Vitals signs and nursing note reviewed.  HENT:     Head: Normocephalic.  Eyes:     Pupils: Pupils are equal, round, and reactive to light.  Neck:     Musculoskeletal: Neck supple.  Cardiovascular:     Rate and Rhythm: Regular rhythm.  Pulmonary:     Effort: Pulmonary effort is normal.  Abdominal:     Tenderness: There is no abdominal tenderness.  Musculoskeletal:     Right lower leg: Edema present.     Left lower leg: Edema present.     Comments: Moderate pitting edema to bilateral lower legs.  Scars on bilateral knees from previous surgeries.  Some more erythematous edema medially just proximal to the knee on the right.  Ecchymosis on left distal medial thigh.  Somewhat decreased range of motion right knee.  Sensation intact in bilateral feet.  Skin:    General: Skin is warm.  Neurological:     Mental Status: He is alert. Mental status is at  baseline.      ED Treatments / Results  Labs (all labs ordered are listed, but only abnormal results are displayed) Labs Reviewed - No data to display  EKG None  Radiology US Venous Img Lower Unilateral Right  Result Date: 06/09/2019 CLINICAL DATA:  73 year old male with RIGHT LOWER extremity pain and swelling. EXAM: RIGHT LOWER EXTREMITY VENOUS DOPPLER ULTRASOUND TECHNIQUE: Gray-scale sonography with graded compression, as well as color Doppler and duplex ultrasound were performed to evaluate the lower extremity deep venous systems from the level of the common femoral vein and including the common femoral, femoral, profunda femoral, popliteal and calf veins including the posterior tibial, peroneal and gastrocnemius veins when visible. The superficial great saphenous vein was also interrogated. Spectral Doppler was utilized to evaluate flow at rest and with distal augmentation maneuvers in the common femoral, femoral and popliteal  veins. COMPARISON:  None. FINDINGS: Contralateral Common Femoral Vein: Respiratory phasicity is normal and symmetric with the symptomatic side. No evidence of thrombus. Normal compressibility. Normal flow, compressibility, and augmentation within the RIGHT distal common femoral, proximal profunda femoral, proximal greater saphenous, entire femoral and popliteal veins. The calf veins are difficult to visualized. RIGHT LOWER extremity subcutaneous edema noted. IMPRESSION: No evidence of RIGHT LOWER extremity deep venous thrombosis. Calf veins difficult to visualize due to body habitus. RIGHT LOWER extremity subcutaneous edema. Electronically Signed   By: Margarette Canada M.D.   On: 06/09/2019 13:41   Dg Knee Complete 4 Views Right  Result Date: 06/09/2019 CLINICAL DATA:  Acute RIGHT knee pain following fall 2 weeks ago. History of prior knee replacement. EXAM: RIGHT KNEE - COMPLETE 4+ VIEW COMPARISON:  02/06/2013 FINDINGS: LEFT total knee arthroplasty changes again noted. No acute abnormality or complicating features noted. No acute fracture, subluxation or dislocation. Diffuse soft tissue swelling is noted. IMPRESSION: Soft tissue swelling without acute bony abnormality. Total knee arthroplasty without complicating features. Electronically Signed   By: Margarette Canada M.D.   On: 06/09/2019 13:39    Procedures Procedures (including critical care time)  Medications Ordered in ED Medications - No data to display   Initial Impression / Assessment and Plan / ED Course  I have reviewed the triage vital signs and the nursing notes.  Pertinent labs & imaging results that were available during my care of the patient were reviewed by me and considered in my medical decision making (see chart for details).        Patient with right lower extremity pain after fall couple weeks ago.  X-ray reassuring.  Doppler done due to swelling of the leg.  Doppler somewhat limited but reassuring.  Also is already on  anticoagulation.  Does have some redness of the edema.  Will treat with antibiotics.  Discharge home.  Final Clinical Impressions(s) / ED Diagnoses   Final diagnoses:  Fall, initial encounter  Peripheral edema  Cellulitis of right lower extremity    ED Discharge Orders         Ordered    cephALEXin (KEFLEX) 500 MG capsule  4 times daily     06/09/19 1355           Davonna Belling, MD 06/09/19 [phone]

## 2019-06-15 ENCOUNTER — Emergency Department (HOSPITAL_BASED_OUTPATIENT_CLINIC_OR_DEPARTMENT_OTHER): Payer: Medicare Other

## 2019-06-15 ENCOUNTER — Other Ambulatory Visit: Payer: Self-pay

## 2019-06-15 ENCOUNTER — Encounter (HOSPITAL_BASED_OUTPATIENT_CLINIC_OR_DEPARTMENT_OTHER): Payer: Self-pay | Admitting: *Deleted

## 2019-06-15 ENCOUNTER — Emergency Department (HOSPITAL_BASED_OUTPATIENT_CLINIC_OR_DEPARTMENT_OTHER)
Admission: EM | Admit: 2019-06-15 | Discharge: 2019-06-15 | Disposition: A | Discharge status: Home / Self Care | Payer: Medicare Other | Attending: Emergency Medicine | Admitting: Emergency Medicine

## 2019-06-15 DIAGNOSIS — I2510 Atherosclerotic heart disease of native coronary artery without angina pectoris: Secondary | ICD-10-CM | POA: Insufficient documentation

## 2019-06-15 DIAGNOSIS — I110 Hypertensive heart disease with heart failure: Secondary | ICD-10-CM | POA: Diagnosis not present

## 2019-06-15 DIAGNOSIS — Z87891 Personal history of nicotine dependence: Secondary | ICD-10-CM | POA: Insufficient documentation

## 2019-06-15 DIAGNOSIS — R0602 Shortness of breath: Secondary | ICD-10-CM | POA: Diagnosis present

## 2019-06-15 DIAGNOSIS — E039 Hypothyroidism, unspecified: Secondary | ICD-10-CM | POA: Insufficient documentation

## 2019-06-15 DIAGNOSIS — Z96641 Presence of right artificial hip joint: Secondary | ICD-10-CM | POA: Insufficient documentation

## 2019-06-15 DIAGNOSIS — I509 Heart failure, unspecified: Secondary | ICD-10-CM | POA: Insufficient documentation

## 2019-06-15 DIAGNOSIS — Z96653 Presence of artificial knee joint, bilateral: Secondary | ICD-10-CM | POA: Diagnosis not present

## 2019-06-15 LAB — POCT I-STAT EG7
Acid-Base Excess: 3 mmol/L — ABNORMAL HIGH (ref 0.0–2.0)
Bicarbonate: 28.1 mmol/L — ABNORMAL HIGH (ref 20.0–28.0)
Calcium, Ion: 1.25 mmol/L (ref 1.15–1.40)
HCT: 34 % — ABNORMAL LOW (ref 39.0–52.0)
Hemoglobin: 11.6 g/dL — ABNORMAL LOW (ref 13.0–17.0)
O2 Saturation: 84 %
Potassium: 3.8 mmol/L (ref 3.5–5.1)
Sodium: 139 mmol/L (ref 135–145)
TCO2: 29 mmol/L (ref 22–32)
pCO2, Ven: 45.8 mmHg (ref 44.0–60.0)
pH, Ven: 7.395 (ref 7.250–7.430)
pO2, Ven: 49 mmHg — ABNORMAL HIGH (ref 32.0–45.0)

## 2019-06-15 LAB — CBC
HCT: 33.4 % — ABNORMAL LOW (ref 39.0–52.0)
Hemoglobin: 10.4 g/dL — ABNORMAL LOW (ref 13.0–17.0)
MCH: 28.7 pg (ref 26.0–34.0)
MCHC: 31.1 g/dL (ref 30.0–36.0)
MCV: 92 fL (ref 80.0–100.0)
Platelets: 205 10*3/uL (ref 150–400)
RBC: 3.63 MIL/uL — ABNORMAL LOW (ref 4.22–5.81)
RDW: 16.8 % — ABNORMAL HIGH (ref 11.5–15.5)
WBC: 5.3 10*3/uL (ref 4.0–10.5)
nRBC: 0 % (ref 0.0–0.2)

## 2019-06-15 LAB — COMPREHENSIVE METABOLIC PANEL
ALT: 19 U/L (ref 0–44)
AST: 22 U/L (ref 15–41)
Albumin: 3.4 g/dL — ABNORMAL LOW (ref 3.5–5.0)
Alkaline Phosphatase: 69 U/L (ref 38–126)
Anion gap: 10 (ref 5–15)
BUN: 17 mg/dL (ref 8–23)
CO2: 25 mmol/L (ref 22–32)
Calcium: 8.8 mg/dL — ABNORMAL LOW (ref 8.9–10.3)
Chloride: 104 mmol/L (ref 98–111)
Creatinine, Ser: 1.05 mg/dL (ref 0.61–1.24)
GFR calc Af Amer: >60 mL/min (ref >60)
GFR calc non Af Amer: >60 mL/min (ref >60)
Glucose, Bld: 117 mg/dL — ABNORMAL HIGH (ref 70–99)
Potassium: 3.8 mmol/L (ref 3.5–5.1)
Sodium: 139 mmol/L (ref 135–145)
Total Bilirubin: 1.2 mg/dL (ref 0.3–1.2)
Total Protein: 6.6 g/dL (ref 6.5–8.1)

## 2019-06-15 LAB — BRAIN NATRIURETIC PEPTIDE: B Natriuretic Peptide: 283.8 pg/mL — ABNORMAL HIGH (ref 0.0–100.0)

## 2019-06-15 MED ORDER — FUROSEMIDE 10 MG/ML IJ SOLN
20.0000 mg | Freq: Once | INTRAMUSCULAR | Status: AC
  Administered 2019-06-15: 20 mg via INTRAVENOUS
  Filled 2019-06-15: qty 2

## 2019-06-15 MED ORDER — FUROSEMIDE 20 MG PO TABS: 20.0000 mg | ORAL_TABLET | Freq: Every day | ORAL | 0 refills | Status: DC

## 2019-06-15 NOTE — ED Triage Notes (Signed)
Chronic Sob worse after falling 2 weeks ago. Hx of COPD. He is here with c.o SOB.

## 2019-06-15 NOTE — ED Notes (Signed)
Discharge delayed due to pt having to void every 5 minutes. Pt advised to call out when the urge to void slows down.

## 2019-06-15 NOTE — Discharge Instructions (Addendum)
You were evaluated in the Emergency Department and after careful evaluation, we did not find any emergent condition requiring admission or further testing in the hospital.  Your symptoms seem to be due to a mild case of congestive heart failure.  This condition can be managed with medications and close follow-up with a primary care doctor and/or cardiologist.  We are providing you with a prescription for Lasix, which will help you pee out extra fluid that you have on the body.  We gave you a dose today through the IV.  Please fill this prescription and take your first tablet tomorrow.  As discussed, it will be important to follow-up with your regular doctor for repeat evaluation and testing within the next few days.  Please return to the Emergency Department if you experience any worsening of your condition.  We encourage you to follow up with a primary care provider.  Thank you for allowing [place] to be a part of your care.

## 2019-06-15 NOTE — ED Provider Notes (Signed)
[facility] Emergency Department Provider Note MRN:  [id number]  Arrival date & time: 06/15/19     Chief Complaint   Shortness of Breath   History of Present Illness   [name] is a 73 y.o. year-old male with a history of A. fib, AKI, COPD, lymphoma presenting to the ED with chief complaint of shortness of breath.  Continued persistent shortness of breath for the past 2 weeks.  Worse with minimal exertion.  Had a fall 2 weeks ago down some stairs, was evaluated in the emergency department.  Denies pain.  No chest pain, no pain with deep breaths.  Denies headache or vision change, no nausea or vomiting, no abdominal pain.  Review of Systems  A complete 10 system review of systems was obtained and all systems are negative except as noted in the HPI and PMH.   Patient's Health History    Past Medical History:  Diagnosis Date   A-fib ([place])    hx.- sinus rhythm now   Acid reflux    AKI (acute kidney injury) ([place])    mild   Anxiety    Arthritis    Atrial dilatation, bilateral    Severly, enlargement  per ECHO 11/03/16   BPH (benign prostatic hyperplasia)    CAD (coronary artery disease)    Cancer (HCC)    Depression    Dislocation of internal right hip prosthesis, sequela    Dysrhythmia    hx a fib   Elevated brain natriuretic peptide (BNP) level    Environmental allergies    Fracture of wrist 10/23/13   LEFT   High cholesterol    History of Graves' disease    History of kidney stones    surg. removal 02/2017   History of syncope    HLD (hyperlipidemia)    Hypertension    Hypothyroidism    thyroid removed   Left knee injury    cap dislocated   Low testosterone, possible hypogonadism 10/01/2012   Lumbago    Metabolic bone disease    Multiple falls    Osteoporosis    Severe   Other abnormalities of gait and mobility    Plaque psoriasis    Sleep apnea    cpap machine-settings 17   Thrombocytopenia  (HCC)    Thyroid disease    HX GRAVES DISEASE   Transfusion history    s/p 12'13 hip surgery   Unsteady gait    UTI (urinary tract infection)    Vertebral compression fracture (HCC)    L1-wears brace    Past Surgical History:  Procedure Laterality Date   APPENDECTOMY     CARDIAC CATHETERIZATION  2013   CATARACT EXTRACTION, BILATERAL     COLONOSCOPY     CYSTOSCOPY WITH RETROGRADE PYELOGRAM, URETEROSCOPY AND STENT PLACEMENT Left 11/07/2017   Procedure: CYSTOSCOPY WITH RETROGRADE PYELOGRAM, URETEROSCOPY AND STENT PLACEMENT;  Surgeon: Cleon Gustin, MD;  Location: [facility];  Service: Urology;  Laterality: Left;   CYSTOSCOPY/URETEROSCOPY/HOLMIUM LASER/STENT PLACEMENT Right 03/11/2017   Procedure: CYSTOSCOPY/URETEROSCOPYSTENT PLACEMENT right ureter retrograde pylegram;  Surgeon: Cleon Gustin, MD;  Location: [facility];  Service: Urology;  Laterality: Right;   HARDWARE REMOVAL  10/05/2012   Procedure: HARDWARE REMOVAL;  Surgeon: [name], MD;  Location: [facility];  Service: Orthopedics;  Laterality: Right;  REMOVING  STRYKER  GAMMA NAIL   HARDWARE REMOVAL Right 07/03/2013   Procedure: HARDWARE REMOVAL RIGHT TIBIA ;  Surgeon: Rozanna Box, MD;  Location: [place];  Service: Orthopedics;  Laterality: Right;   HIP CLOSED REDUCTION Right 10/15/2013   Procedure: CLOSED MANIPULATION HIP;  Surgeon: [name], MD;  Location: [facility];  Service: Orthopedics;  Laterality: Right;   hip revision     June 2017   HOLMIUM LASER APPLICATION Right [id number]   Procedure: HOLMIUM LASER APPLICATION;  Surgeon: Cleon Gustin, MD;  [facility]: [facility];  Service: Urology;  Laterality: Right;   HOLMIUM LASER APPLICATION Left [id number]   Procedure: HOLMIUM LASER APPLICATION;  Surgeon: Cleon Gustin, MD;  [facility];  Service: Urology;  Laterality: Left;   HOLMIUM LASER APPLICATION Left [id number]   Procedure: HOLMIUM LASER APPLICATION;  Surgeon: Cleon Gustin, MD;  Location:  [facility];  Service: Urology;  Laterality: Left;   INCISION AND DRAINAGE HIP Right 11/16/2013   Procedure: IRRIGATION AND DEBRIDEMENT RIGHT HIP;  Surgeon: [name], MD;  [facility];  Service: Orthopedics;  Laterality: Right;   IR IMAGING GUIDED PORT INSERTION  06/22/2018   IR NEPHROSTOMY PLACEMENT LEFT  03/28/2019   IR URETERAL STENT LEFT NEW ACCESS W/O SEP NEPHROSTOMY CATH  10/24/2017   IR URETERAL STENT LEFT NEW ACCESS W/O SEP NEPHROSTOMY CATH  03/26/2019   JOINT REPLACEMENT     hip-right x2   KNEE SURGERY Bilateral 2012   Total knee replacements   NEPHROLITHOTOMY Right 02/08/2017   Procedure: NEPHROLITHOTOMY PERCUTANEOUS WITH SURGEON ACCESS;  Surgeon: Cleon Gustin, MD;  [facility];  Service: Urology;  Laterality: Right;   NEPHROLITHOTOMY Left 10/24/2017   Procedure: NEPHROLITHOTOMY PERCUTANEOUS;  Surgeon: Cleon Gustin, MD;  [facility];  Service: Urology;  Laterality: Left;   NEPHROLITHOTOMY Left 03/26/2019   Procedure: NEPHROLITHOTOMY PERCUTANEOUS;  Surgeon: Cleon Gustin, MD;  [facility];  Service: Urology;  Laterality: Left;  2 HRS   NEPHROLITHOTOMY Left 04/17/2019   Procedure: NEPHROLITHOTOMY PERCUTANEOUS;  Surgeon: Cleon Gustin, MD;  [facility];  Service: Urology;  Laterality: Left;  2 HRS   ORIF TIBIA FRACTURE Right 02/06/2013   Procedure: OPEN REDUCTION INTERNAL FIXATION (ORIF) TIBIA FRACTURE WITH IM ROD FIBULA;  Surgeon: Rozanna Box, MD;  Location: [place];  Service: Orthopedics;  Laterality: Right;   ORIF TIBIA FRACTURE Right 07/03/2013   Procedure: RIGHT TIBIA NON UNION REPAIR ;  Surgeon: Rozanna Box, MD;  Location: [place];  Service: Orthopedics;  Laterality: Right;   ORIF WRIST FRACTURE  10/02/2012   Procedure: OPEN REDUCTION INTERNAL FIXATION (ORIF) WRIST FRACTURE;  Surgeon: Roseanne Kaufman, MD;  [facility];  Service: Orthopedics;  Laterality: Right;  WITH   ANTIBIOTIC  CEMENT   ORIF WRIST FRACTURE Left  10/28/2013   Procedure: OPEN REDUCTION INTERNAL FIXATION (ORIF) WRIST FRACTURE with allograft;  Surgeon: Roseanne Kaufman, MD;  Location[facility];  Service: Orthopedics;  Laterality: Left;  DVR Plate   QUADRICEPS TENDON REPAIR Left 07/15/2017   Procedure: REPAIR QUADRICEP TENDON;  Surgeon: Frederik Pear, MD;  Location: [place];  Service: Orthopedics;  Laterality: Left;   right femur surgery  05/2012   THYROIDECTOMY  02/1986   TOTAL HIP REVISION  10/05/2012   Procedure: TOTAL HIP REVISION;  Surgeon: [name], MD;  [facility];  Service: Orthopedics;  Laterality: Right;  RIGHT TOTAL HIP REVISION   TOTAL HIP REVISION Right 09/17/2013   Procedure: REVISION RIGHT TOTAL HIP ARTHROPLASTY ;  Surgeon: [name], MD;  [facility];  Service: Orthopedics;  Laterality: Right;   TOTAL HIP REVISION  Right 10/26/2013   Procedure: REVISION RIGHT TOTAL HIP ARTHROPLASTY;  Surgeon: [name], MD;  [facility];  Service: Orthopedics;  Laterality: Right;   TOTAL HIP REVISION  03/2016   TOTAL KNEE REVISION Left 04/11/2017   Procedure: TOTAL KNEE REVISION PATELLA and TIBIA;  Surgeon: Frederik Pear, MD;  Location: [place];  Service: Orthopedics;  Laterality: Left;   WRIST FRACTURE SURGERY  05/2012    Family History  Problem Relation Age of Onset   CAD Father 19   Asthma Father    Alcohol abuse Father    Arthritis Mother    Alcohol abuse Sister     Social History   Socioeconomic History   Marital status: Married    Spouse name: Not on file   Number of children: Not on file   Years of education: Not on file   Highest education level: Not on file  Occupational History   Not on file  Social Needs   Financial resource strain: Not on file   Food insecurity    Worry: Not on file    Inability: Not on file   Transportation needs    Medical: Not on file    Non-medical: Not on file  Tobacco Use   Smoking status: Former Smoker    Quit date: 06/05/2012    Years since  quitting: 7.0   Smokeless tobacco: Never Used  Substance and Sexual Activity   Alcohol use: Yes    Comment: occasional-social   Drug use: No   Sexual activity: Yes  Lifestyle   Physical activity    Days per week: Not on file    Minutes per session: Not on file   Stress: Not on file  Relationships   Social connections    Talks on phone: Not on file    Gets together: Not on file    Attends religious service: Not on file    Active member of club or organization: Not on file    Attends meetings of clubs or organizations: Not on file    Relationship status: Not on file   Intimate partner violence    Fear of current or ex partner: Not on file    Emotionally abused: Not on file    Physically abused: Not on file    Forced sexual activity: Not on file  Other Topics Concern   Not on file  Social History Narrative   [place] 2.5 months, went home Feb 21st slipped and fell on back and developed.  Home PT/OT.  Just started outpatient physical therapy.  Friday night, misstepped.       Physical Exam  Vital Signs and Nursing Notes reviewed Vitals:   06/15/19 1113 06/15/19 1148  BP: 136/86   Pulse: 79   Resp: 20   Temp: 98.6 F (37 C)   SpO2: 97% 98%    CONSTITUTIONAL: Chronically ill-appearing, NAD NEURO:  Alert and oriented x 3, no focal deficits EYES:  eyes equal and reactive ENT/NECK:  no LAD, no JVD CARDIO: Regular rate, well-perfused, normal S1 and S2 PULM:  CTAB no wheezing or rhonchi GI/GU:  normal bowel sounds, non-distended, non-tender MSK/SPINE:  No gross deformities, 2+ pitting edema to bilateral lower extremities SKIN:  no rash, atraumatic PSYCH:  Appropriate speech and behavior  Diagnostic and Interventional Summary    EKG Interpretation  Date/Time:  Friday June 15 2019 11:24:30 EDT Ventricular Rate:  89 PR Interval:    QRS Duration: 122 QT Interval:  395 QTC Calculation: 481 R  Axis:   -26 Text Interpretation:  Atrial fibrillation Nonspecific  intraventricular conduction delay Anterior infarct, old Baseline wander in lead(s) V4 V5 V6 Confirmed by Gerlene Fee [phone]) on 06/15/2019 11:27:23 AM      Labs Reviewed  CBC - Abnormal; Notable for the following components:      Result Value   RBC 3.63 (*)    Hemoglobin 10.4 (*)    HCT 33.4 (*)    RDW 16.8 (*)    All other components within normal limits  COMPREHENSIVE METABOLIC PANEL - Abnormal; Notable for the following components:   Glucose, Bld 117 (*)    Calcium 8.8 (*)    Albumin 3.4 (*)    All other components within normal limits  BRAIN NATRIURETIC PEPTIDE - Abnormal; Notable for the following components:   B Natriuretic Peptide 283.8 (*)    All other components within normal limits  POCT I-STAT EG7 - Abnormal; Notable for the following components:   pO2, Ven 49.0 (*)    Bicarbonate 28.1 (*)    Acid-Base Excess 3.0 (*)    HCT 34.0 (*)    Hemoglobin 11.6 (*)    All other components within normal limits    DG Chest 2 View  Final Result      Medications  furosemide (LASIX) injection 20 mg (20 mg Intravenous Given 06/15/19 1249)     Procedures Critical Care  ED Course and Medical Decision Making  I have reviewed the triage vital signs and the nursing notes.  Pertinent labs & imaging results that were available during my care of the patient were reviewed by me and considered in my medical decision making (see below for details).  COPD versus CHF versus deconditioning in this 73 year old male with multiple comorbidities.  Doubt PE given the chronic/gradual nature, and the fact the patient is already anticoagulated.  Awaiting labs and chest x-ray results.  Clinical Course as of Jun 14 1354  Fri Jun 15, 2019  1246 Work-up reveals likely mild episode of congestive heart failure.  Would be a new diagnosis for this patient.  He is Lasix nave.  He is able to perform all ADLs at home, he has normal vital signs, if able to respond to Lasix here, will be able to discharge  home with 20 mg Lasix daily.  He is able to follow-up with his primary care doctor within the next few days for repeat evaluation and testing.   [MB]    Clinical Course User Index [MB] Maudie Flakes, MD    1:30 PM update: Excellent fluid response after IV Lasix, getting out of bed and ambulating in the emergency department without issue.  He is appropriate for discharge with close follow-up.  Barth Kirks. [place] Small, [place] [email or web address]  Final Clinical Impressions(s) / ED Diagnoses     ICD-10-CM   1. Acute congestive heart failure, unspecified heart failure type ([place])  I50.9   2. SOB (shortness of breath)  R06.02 DG Chest 2 View    DG Chest 2 View    ED Discharge Orders         Ordered    furosemide (LASIX) 20 MG tablet  Daily     06/15/19 1353          Discharge Instructions Discussed with and Provided to Patient:   Discharge Instructions     You were evaluated in the Emergency Department and after careful evaluation, we did not find any emergent condition requiring admission  or further testing in the hospital.  Your symptoms seem to be due to a mild case of congestive heart failure.  This condition can be managed with medications and close follow-up with a primary care doctor and/or cardiologist.  We are providing you with a prescription for Lasix, which will help you pee out extra fluid that you have on the body.  We gave you a dose today through the IV.  Please fill this prescription and take your first tablet tomorrow.  As discussed, it will be important to follow-up with your regular doctor for repeat evaluation and testing within the next few days.  Please return to the Emergency Department if you experience any worsening of your condition.  We encourage you to follow up with a primary care provider.  Thank you for allowing [place] to be a part of your care.        Maudie Flakes, MD 06/15/19 1356

## 2019-06-19 ENCOUNTER — Telehealth: Payer: Self-pay | Admitting: Cardiovascular Disease

## 2019-06-19 NOTE — Telephone Encounter (Signed)
New Message   Patient states he received a letter stating the Dr. Oval Linsey can't see him anymore, but he no longer has the letter.  Theres no letter in patient chart.  Please call patient to discuss.

## 2019-06-19 NOTE — Telephone Encounter (Signed)
Routed to primary nurse 

## 2019-06-19 NOTE — Telephone Encounter (Signed)
Left message to call back  

## 2019-06-20 ENCOUNTER — Inpatient Hospital Stay: Payer: Medicare Other | Admitting: Internal Medicine

## 2019-06-20 NOTE — Telephone Encounter (Signed)
  Mr [name] is returning call from [place]

## 2019-06-20 NOTE — Telephone Encounter (Signed)
Spoke with Mr [name] and he was not sure if the letter was from insurance or what. Assured him Dr Oval Linsey had not dismissed him and sent message to billing to make sure she still participates with his plan

## 2019-06-21 ENCOUNTER — Other Ambulatory Visit: Payer: Self-pay

## 2019-06-21 ENCOUNTER — Ambulatory Visit (INDEPENDENT_AMBULATORY_CARE_PROVIDER_SITE_OTHER): Payer: Medicare Other | Admitting: Internal Medicine

## 2019-06-21 ENCOUNTER — Encounter: Payer: Self-pay | Admitting: Internal Medicine

## 2019-06-21 DIAGNOSIS — N200 Calculus of kidney: Secondary | ICD-10-CM

## 2019-06-21 DIAGNOSIS — C8330 Diffuse large B-cell lymphoma, unspecified site: Secondary | ICD-10-CM

## 2019-06-21 DIAGNOSIS — I5022 Chronic systolic (congestive) heart failure: Secondary | ICD-10-CM | POA: Insufficient documentation

## 2019-06-21 DIAGNOSIS — I509 Heart failure, unspecified: Secondary | ICD-10-CM | POA: Insufficient documentation

## 2019-06-21 DIAGNOSIS — I5021 Acute systolic (congestive) heart failure: Secondary | ICD-10-CM | POA: Diagnosis not present

## 2019-06-21 DIAGNOSIS — I5032 Chronic diastolic (congestive) heart failure: Secondary | ICD-10-CM | POA: Insufficient documentation

## 2019-06-21 NOTE — Patient Instructions (Signed)
These suggestions will probably help you to improve your metabolism if you are not overweight and to lose weight if you are overweight: 1.  Reduce your consumption of sugars and starches.  Eliminate high fructose corn syrup from your diet.  Reduce your consumption of processed foods.  For desserts try to have seasonal fruits, berries, nuts, cheeses or dark chocolate with more than 70% cacao. 2.  Do not snack 3.  You do not have to eat breakfast.  If you choose to have breakfast-eat plain greek yogurt, eggs, oatmeal (without sugar) 4.  Drink water, freshly brewed unsweetened tea (green, black or herbal) or coffee.  Do not drink sodas including diet sodas , juices, beverages sweetened with artificial sweeteners. 5.  Reduce your consumption of refined grains. 6.  Avoid protein drinks such as Optifast, Slim fast etc. Eat chicken, fish, meat, dairy and beans for your sources of protein 7.  Natural unprocessed fats like cold pressed virgin olive oil, butter, coconut oil are good for you.  Eat avocados 8.  Increase your consumption of fiber.  Fruits, berries, vegetables, whole grains, flaxseeds, Chia seeds, beans, popcorn, nuts, oatmeal are good sources of fiber 9.  Use vinegar in your diet, i.e. apple cider vinegar, red wine or balsamic vinegar 10.  You can try fasting.  For example you can skip breakfast and lunch every other day (24-hour fast) 11.  Stress reduction, good night sleep, relaxation, meditation, yoga and other physical activity is likely to help you to maintain low weight too. 12.  If you drink alcohol, limit your alcohol intake to no more than 2 drinks a day.   Mediterranean diet is good for you. ([employer] has a typical Mediterranean cuisine menu) The Mediterranean diet is a way of eating based on the traditional cuisine of countries bordering the [place] Sea. While there is no single definition of the Mediterranean diet, it is typically high in vegetables, fruits, whole grains,  beans, nut and seeds, and olive oil. The main components of Mediterranean diet include: . Daily consumption of vegetables, fruits, whole grains and healthy fats  . Weekly intake of fish, poultry, beans and eggs  . Moderate portions of dairy products  . Limited intake of red meat Other important elements of the Mediterranean diet are sharing meals with family and friends, enjoying a glass of red wine and being physically active. Health benefits of a Mediterranean diet: A traditional Mediterranean diet consisting of large quantities of fresh fruits and vegetables, nuts, fish and olive oil-coupled with physical activity-can reduce your risk of serious mental and physical health problems by: Preventing heart disease and strokes. Following a Mediterranean diet limits your intake of refined breads, processed foods, and red meat, and encourages drinking red wine instead of hard liquor-all factors that can help prevent heart disease and stroke. Keeping you agile. If you're an older adult, the nutrients gained with a Mediterranean diet may reduce your risk of developing muscle weakness and other signs of frailty by about 70 percent. Reducing the risk of Alzheimer's. Research suggests that the Mediterranean diet may improve cholesterol, blood sugar levels, and overall blood vessel health, which in turn may reduce your risk of Alzheimer's disease or dementia. Halving the risk of Parkinson's disease. The high levels of antioxidants in the Mediterranean diet can prevent cells from undergoing a damaging process called oxidative stress, thereby cutting the risk of Parkinson's disease in half. Increasing longevity. By reducing your risk of developing heart disease or cancer with the Mediterranean diet,   you're reducing your risk of death at any age by 20%. Protecting against type 2 diabetes. A Mediterranean diet is rich in fiber which digests slowly, prevents huge swings in blood sugar, and can help you maintain a  healthy weight.    Cabbage soup recipe that will not make you gain weight: Take 1 small head of cabbage, 1 average pack of celery, 4 green peppers, 4 onions, 2 cans diced tomatoes (they are not available without salt), salt and spices to taste.  Chop cabbage, celery, peppers and onions.  And tomatoes and 2-2.5 liters (2.5 quarts) of water so that it would just cover the vegetables.  Bring to boil.  Add spices and salt.  Turn heat to low/medium and simmer for 20-25 minutes.  Naturally, you can make a smaller batch and change some of the ingredients.  

## 2019-06-21 NOTE — Assessment & Plan Note (Signed)
S/p procedure

## 2019-06-21 NOTE — Assessment & Plan Note (Addendum)
Poss due to chemo vs other Card appt -- Dr Oval Linsey Sept 26 Cont w/CPAP Lasix  BMET ECHO

## 2019-06-21 NOTE — Progress Notes (Signed)
Subjective:  Patient ID: [name], male    DOB: [birth year]  Age: 73 y.o. MRN: [id number]  CC: No chief complaint on file.   HPI [name] presents for SOB, edema x 2 weeks and CHF dx'ed on 9/4 by Dr Sedonia Small - s/p ER visit. Better on PO Lasix. On NAS diet C/o wt gain  Outpatient Medications Prior to Visit  Medication Sig Dispense Refill  . acetaminophen (TYLENOL) 500 MG tablet Take 1,000 mg by mouth daily as needed for moderate pain.    [name] albuterol (PROVENTIL) (2.5 MG/3ML) 0.083% nebulizer solution Take 3 mLs (2.5 mg total) by nebulization every 4 (four) hours as needed for wheezing or shortness of breath. 75 mL 12  . alfuzosin (UROXATRAL) 10 MG 24 hr tablet Take 10 mg by mouth at bedtime.    [name] atorvastatin (LIPITOR) 80 MG tablet TAKE 1 TABLET BY MOUTH  DAILY 90 tablet 0  . BELSOMRA 15 MG TABS TAKE 1 TABLET BY MOUTH  DAILY 90 tablet 1  . buPROPion (WELLBUTRIN XL) 300 MG 24 hr tablet TAKE 1 TABLET BY MOUTH  DAILY WITH BREAKFAST 90 tablet 3  . Calcium Citrate-Vitamin D (CALCIUM CITRATE + D PO) Take 1 tablet by mouth 2 (two) times daily.     . cephALEXin (KEFLEX) 500 MG capsule Take 1 capsule (500 mg total) by mouth 4 (four) times daily. 28 capsule 0  . Cholecalciferol (VITAMIN D-3 PO) Take 2,000 Units by mouth 2 (two) times daily.     . clonazePAM (KLONOPIN) 1 MG tablet TAKE 1 TABLET BY MOUTH  EVERY 6 HOURS AS NEEDED FOR ANXIETY 360 tablet 1  . denosumab (PROLIA) 60 MG/ML SOSY injection Inject 60 mg as directed every 6 (six) months. Last injection: Feb 2020 Next injection: August 2020    . diltiazem (CARDIZEM CD) 240 MG 24 hr capsule TAKE 1 CAPSULE BY MOUTH  DAILY 90 capsule 3  . doxycycline (VIBRA-TABS) 100 MG tablet Take 1 tablet (100 mg total) by mouth daily. 90 tablet 3  . DULoxetine (CYMBALTA) 60 MG capsule TAKE 1 CAPSULE BY MOUTH  TWICE DAILY 180 capsule 3  . EPINEPHrine (EPIPEN 2-PAK) 0.3 mg/0.3 mL IJ SOAJ injection Inject 0.3 mg into the muscle daily as needed (for  anaphylactic reaction).    . ezetimibe (ZETIA) 10 MG tablet TAKE 1 TABLET BY MOUTH  DAILY 90 tablet 0  . ferrous sulfate 325 (65 FE) MG tablet Take 325 mg by mouth 2 (two) times daily with a meal.    . furosemide (LASIX) 20 MG tablet Take 1 tablet (20 mg total) by mouth daily. 30 tablet 0  . HYDROcodone-acetaminophen (NORCO) 7.5-325 MG tablet Take 1 tablet by mouth 4 (four) times daily as needed for moderate pain. Please fill on 04/06/19 120 tablet 0  . HYDROcodone-acetaminophen (NORCO) 7.5-325 MG tablet Take 1 tablet by mouth every 6 (six) hours as needed for severe pain. 120 tablet 0  . HYDROcodone-acetaminophen (NORCO) 7.5-325 MG tablet Take 1 tablet by mouth 4 (four) times daily as needed for moderate pain. 120 tablet 0  . levothyroxine (SYNTHROID) 200 MCG tablet TAKE 1 TABLET BY MOUTH  DAILY AT 6 AM 90 tablet 3  . levothyroxine (SYNTHROID) 25 MCG tablet TAKE 1 TABLET BY MOUTH  DAILY BEFORE BREAKFAST 90 tablet 3  . loratadine (CLARITIN) 10 MG tablet Take 10 mg by mouth daily.     . Magnesium 500 MG CAPS Take 1 capsule (500 mg total) by mouth daily. 30 capsule  1  . metoprolol tartrate (LOPRESSOR) 25 MG tablet TAKE 1 TABLET BY MOUTH TWO  TIMES DAILY 180 tablet 2  . mirabegron ER (MYRBETRIQ) 25 MG TB24 tablet Take 25 mg by mouth at bedtime.     . Multiple Vitamin (MULTIVITAMIN WITH MINERALS) TABS tablet Take 1 tablet by mouth daily. Men's One-A-Day 90+    . nitroGLYCERIN (NITROSTAT) 0.4 MG SL tablet Place 0.4 mg under the tongue every 5 (five) minutes as needed for chest pain. x3 doses as needed for chest pain    . omeprazole (PRILOSEC) 40 MG capsule TAKE 1 CAPSULE BY MOUTH  DAILY BEFORE BREAKFAST 90 capsule 3  . ondansetron (ZOFRAN) 4 MG tablet Take 1 tablet (4 mg total) by mouth every 6 (six) hours as needed for nausea. 20 tablet 0  . OTEZLA 30 MG TABS Take 30 mg by mouth 2 (two) times a day.    . oxyCODONE-acetaminophen (PERCOCET) 5-325 MG tablet Take 1 tablet by mouth every 4 (four) hours as  needed for moderate pain or severe pain. 30 tablet 0  . Probiotic Product (PROBIOTIC PO) Take 1 capsule by mouth daily with breakfast.    . prochlorperazine (COMPAZINE) 10 MG tablet Take 1 tablet (10 mg total) by mouth every 6 (six) hours as needed (Nausea or vomiting). 30 tablet 6  . testosterone enanthate (DELATESTRYL) 200 MG/ML injection INJECT 1ML (200MG ) INTO THE MUSCLE EVERY 14 DAYS. DISCARD VIAL AFTER 28 DAYS. 5 mL 3  . tiZANidine (ZANAFLEX) 4 MG tablet TAKE 1 TABLET BY MOUTH  EVERY 6 HOURS AS NEEDED FOR MUSCLE SPASM(S) (Patient taking differently: Take 4 mg by mouth every 6 (six) hours as needed for muscle spasms. ) 360 tablet 1  . vitamin C (ASCORBIC ACID) 500 MG tablet Take 500 mg by mouth daily.    Alveda Reasons 10 MG TABS tablet TAKE 1 TABLET BY MOUTH  DAILY 90 tablet 3   No facility-administered medications prior to visit.     ROS: Review of Systems  Objective:  BP 132/76 (BP Location: Right Arm, Patient Position: Sitting, Cuff Size: Large)   Pulse (!) 105   Temp 98.1 F (36.7 C) (Oral)   Ht 6\' 3"  (1.905 m)   Wt (!) 313 lb (142 kg)   SpO2 96%   BMI 39.12 kg/m   BP Readings from Last 3 Encounters:  06/21/19 132/76  06/15/19 (!) 164/93  06/09/19 (!) 154/69    Wt Readings from Last 3 Encounters:  06/21/19 (!) 313 lb (142 kg)  06/15/19 291 lb (132 kg)  06/09/19 292 lb (132.5 kg)    Physical Exam Constitutional:      General: He is not in acute distress.    Appearance: He is well-developed. He is obese.     Comments: NAD  Eyes:     Conjunctiva/sclera: Conjunctivae normal.     Pupils: Pupils are equal, round, and reactive to light.  Neck:     Musculoskeletal: Normal range of motion.     Thyroid: No thyromegaly.     Vascular: No JVD.  Cardiovascular:     Rate and Rhythm: Normal rate. Rhythm irregular.     Heart sounds: Normal heart sounds. No murmur. No friction rub. No gallop.   Pulmonary:     Effort: Pulmonary effort is normal. No respiratory distress.      Breath sounds: Normal breath sounds. No wheezing or rales.  Chest:     Chest wall: No tenderness.  Abdominal:     General: Bowel sounds  are normal. There is no distension.     Palpations: Abdomen is soft. There is no mass.     Tenderness: There is no abdominal tenderness. There is no guarding or rebound.  Musculoskeletal: Normal range of motion.        General: No tenderness.     Right lower leg: Edema present.     Left lower leg: Edema present.  Lymphadenopathy:     Cervical: No cervical adenopathy.  Skin:    General: Skin is warm and dry.     Findings: Bruising present. No rash.  Neurological:     Mental Status: He is alert and oriented to person, place, and time.     Cranial Nerves: No cranial nerve deficit.     Motor: Weakness present. No abnormal muscle tone.     Coordination: Coordination normal.     Gait: Gait abnormal.     Deep Tendon Reflexes: Reflexes are normal and symmetric.  Psychiatric:        Behavior: Behavior normal.        Thought Content: Thought content normal.        Judgment: Judgment normal.   walker Firm edema b  Lab Results  Component Value Date   WBC 5.3 06/15/2019   HGB 11.6 (L) 06/15/2019   HCT 34.0 (L) 06/15/2019   PLT 205 06/15/2019   GLUCOSE 117 (H) 06/15/2019   CHOL 111 10/12/2018   TRIG 64 10/12/2018   HDL 56 10/12/2018   LDLCALC 42 10/12/2018   ALT 19 06/15/2019   AST 22 06/15/2019   NA 139 06/15/2019   K 3.8 06/15/2019   CL 104 06/15/2019   CREATININE 1.05 06/15/2019   BUN 17 06/15/2019   CO2 25 06/15/2019   TSH 0.130 (L) 11/09/2018   PSA 1.01 10/02/2018   INR 1.0 03/26/2019   HGBA1C 5.8 (H) 02/06/2013    Dg Chest 2 View  Result Date: 06/15/2019 CLINICAL DATA:  Worsening shortness of breath after fall in 2 weeks ago. History of COPD. EXAM: CHEST - 2 VIEW COMPARISON:  11/08/2018 FINDINGS: Mild hyperinflation. Right Port-A-Cath tip at mid to low SVC. Midline trachea. Mild cardiomegaly. Atherosclerosis in the transverse aorta.  Trace bilateral pleural fluid. Interval progression of interstitial thickening with septal thickening and thickening of the right minor fissure. No lobar consolidation. IMPRESSION: Findings of mild congestive heart failure with small bilateral pleural effusions and septal/fissural thickening. This is superimposed upon hyperinflation and chronic bronchitis of COPD. Aortic Atherosclerosis (ICD10-I70.0). Electronically Signed   By: Abigail Miyamoto M.D.   On: 06/15/2019 12:34    Assessment & Plan:   There are no diagnoses linked to this encounter.   No orders of the defined types were placed in this encounter.    Follow-up: No follow-ups on file.  Walker Kehr, MD

## 2019-06-21 NOTE — Assessment & Plan Note (Signed)
finished w/chemo - in remission

## 2019-06-28 ENCOUNTER — Ambulatory Visit (HOSPITAL_COMMUNITY): Payer: Medicare Other | Attending: Cardiovascular Disease

## 2019-06-28 ENCOUNTER — Other Ambulatory Visit: Payer: Self-pay

## 2019-06-28 DIAGNOSIS — I5021 Acute systolic (congestive) heart failure: Secondary | ICD-10-CM

## 2019-06-28 MED ORDER — PERFLUTREN LIPID MICROSPHERE
1.0000 mL | INTRAVENOUS | Status: AC | PRN
  Administered 2019-06-28: 2 mL via INTRAVENOUS

## 2019-07-11 ENCOUNTER — Ambulatory Visit: Payer: Medicare Other | Admitting: Cardiovascular Disease

## 2019-07-15 ENCOUNTER — Other Ambulatory Visit: Payer: Self-pay | Admitting: Cardiovascular Disease

## 2019-07-18 ENCOUNTER — Other Ambulatory Visit: Payer: Self-pay | Admitting: Internal Medicine

## 2019-07-25 ENCOUNTER — Other Ambulatory Visit: Payer: Self-pay

## 2019-07-27 MED ORDER — HYDROCODONE-ACETAMINOPHEN 7.5-325 MG PO TABS: 1.0000 | ORAL_TABLET | Freq: Four times a day (QID) | ORAL | 0 refills | Status: DC | PRN

## 2019-08-02 NOTE — Progress Notes (Signed)
HEMATOLOGY/ONCOLOGY CLINIC NOTE  Date of Service: 08/03/2019  Patient Care Team: Plotnikov, Evie Lacks, MD as PCP - General (Internal Medicine) Skeet Latch, MD as PCP - Cardiology (Cardiology) Brunetta Genera, MD as Consulting Physician (Hematology) McKenzie, Candee Furbish, MD as Consulting Physician (Urology) Jackquline Denmark, MD as Consulting Physician (Gastroenterology) Skeet Latch, MD as Attending Physician (Cardiology)  CHIEF COMPLAINTS/PURPOSE OF CONSULTATION:  Follow Up for Diffuse Large B-Cell Lymphoma   HISTORY OF PRESENTING ILLNESS:   [name] is a wonderful 73 y.o. male who has been referred to [place] by Dr. Lew Dawes for evaluation and management of Diffuse Large B-Cell Lymphoma. He is accompanied today by his partner. The pt reports that he is doing well overall.   The pt reports that he presented to ENT Dr. Pollie Friar care after developing a cough and a new nodule in his mouth. He notes that the symptoms presented about a month ago and have not progressed or significantly worsened. He notes that he has frequent sinus drainage, and felt that this enlargement could initially be related to this. He denies any changes in his voice and difficulty breathing.   The pt notes that he is finishing three months of PT tomorrow after surgeries on his right leg and left knee. He notes that he takes blood thinners for Afib and uses his CPAP regularly now. The pt also receives testosterone injections, 200mg  every other week. The pt notes that he had several surgeries bilaterally to remove large kidney stones in the past. He notes that his thyroid replacement has been stable. His vertebral compression fracture was in 2013, after a fall in which he also broke his right femur and wrist. The pt also takes Prolia every 6 months for his osteoporosis. He has had 18 surgeries on his right leg.   He notes that his psoriasis has been stable and takes Enbrel twice a week, and has  taken this for 5 years. He was on 10 years of Humara before this. He was on Methotrexate prior to [facility].   The pt notes that he functions okay at home and is able to drive himself and cook for himself and feels that he can function independently with limitations on lifting heavier objects.   On review of systems, pt reports recent cough, left tonsil mass, stable energy levels, and denies fevers, chills, night sweats, unexpected weight loss, difficulty breathing, difficulty swallowing, noticing any other lumps or bumps, pain along the spine, recent infections, abdominal pains, difficulty urinating, leg swelling, and any other symptoms.  . On Social Hx the pt reports that he smoked cigarettes for many years until he quit 6 years ago, and drinks ETOH socially.    INTERVAL HISTORY   [name] is here for management and evaluation, after completing 6 planned cycles of R-CHOP treatmenfollowup and active surveillance of his Diffuse Large B-Cell Lymphoma. The patient's last visit with [place] was on 05/03/2019. The pt reports that he is doing well overall.  The pt reports his ability to breath is getting worse. He was admitted to the hospital 06/15/2019 and it was determine that he is in acute congestive heart failure.    He is taking precautions to stay safe from COVID-19.  Of note since the patient's last visit, pt has had 2D Echo, Cardiac Doppler, Color Doppler and Intracardiac Opacification Agent completed on 06/28/2019 with results revealing 1. Left ventricular ejection fraction, by visual estimation, is 50 to 55%. The left ventricle has normal function. Left  ventricular septal wall thickness was normal. Normal left ventricular posterior wall thickness. There is no left ventricular hypertrophy  Lab results today (08/03/19) of CBC w/diff and CMP is as follows: all values are WNL except for  Hemoglobin at 12.8, RDW at 17.1, Platelets at 134, Glucose Bld at 116.  On review of systems, pt reports  breathing difficulties and denies lumps or bumps, abdominal pain and any other symptoms.    MEDICAL HISTORY:  Past Medical History:  Diagnosis Date   A-fib ([place])    hx.- sinus rhythm now   Acid reflux    AKI (acute kidney injury) ([place])    mild   Anxiety    Arthritis    Atrial dilatation, bilateral    Severly, enlargement  per ECHO 11/03/16   BPH (benign prostatic hyperplasia)    CAD (coronary artery disease)    Cancer (HCC)    Depression    Dislocation of internal right hip prosthesis, sequela    Dysrhythmia    hx a fib   Elevated brain natriuretic peptide (BNP) level    Environmental allergies    Fracture of wrist 10/23/13   LEFT   High cholesterol    History of Graves' disease    History of kidney stones    surg. removal 02/2017   History of syncope    HLD (hyperlipidemia)    Hypertension    Hypothyroidism    thyroid removed   Left knee injury    cap dislocated   Low testosterone, possible hypogonadism 10/01/2012   Lumbago    Metabolic bone disease    Multiple falls    Osteoporosis    Severe   Other abnormalities of gait and mobility    Plaque psoriasis    Sleep apnea    cpap machine-settings 17   Thrombocytopenia (HCC)    Thyroid disease    HX GRAVES DISEASE   Transfusion history    s/p 12'13 hip surgery   Unsteady gait    UTI (urinary tract infection)    Vertebral compression fracture ([place])    L1-wears brace    SURGICAL HISTORY: Past Surgical History:  Procedure Laterality Date   APPENDECTOMY     CARDIAC CATHETERIZATION  2013   CATARACT EXTRACTION, BILATERAL     COLONOSCOPY     CYSTOSCOPY WITH RETROGRADE PYELOGRAM, URETEROSCOPY AND STENT PLACEMENT Left 11/07/2017   Procedure: CYSTOSCOPY WITH RETROGRADE PYELOGRAM, URETEROSCOPY AND STENT PLACEMENT;  Surgeon: Cleon Gustin, MD;  Location: [facility];  Service: Urology;  Laterality: Left;   CYSTOSCOPY/URETEROSCOPY/HOLMIUM LASER/STENT PLACEMENT Right 03/11/2017     Procedure: CYSTOSCOPY/URETEROSCOPYSTENT PLACEMENT right ureter retrograde pylegram;  Surgeon: Cleon Gustin, MD;  [facility];  Service: Urology;  Laterality: Right;   HARDWARE REMOVAL  10/05/2012   Procedure: HARDWARE REMOVAL;  Surgeon: [name], MD;  [facility];  Service: Orthopedics;  Laterality: Right;  REMOVING  STRYKER  GAMMA NAIL   HARDWARE REMOVAL Right 07/03/2013   Procedure: HARDWARE REMOVAL RIGHT TIBIA ;  Surgeon: Rozanna Box, MD;  Location: [place];  Service: Orthopedics;  Laterality: Right;   HIP CLOSED REDUCTION Right 10/15/2013   Procedure: CLOSED MANIPULATION HIP;  Surgeon: [name], MD;  [facility];  Service: Orthopedics;  Laterality: Right;   hip revision     June 2017   HOLMIUM LASER APPLICATION Right [id number]   Procedure: HOLMIUM LASER APPLICATION;  Surgeon: Cleon Gustin, MD;  [facility];  Service: Urology;  Laterality: Right;   HOLMIUM  LASER APPLICATION Left [id number]   Procedure: HOLMIUM LASER APPLICATION;  Surgeon: Cleon Gustin, MD;  [facility];  Service: Urology;  Laterality: Left;   HOLMIUM LASER APPLICATION Left [id number]   Procedure: HOLMIUM LASER APPLICATION;  Surgeon: Cleon Gustin, MD;  [facility];  Service: Urology;  Laterality: Left;   INCISION AND DRAINAGE HIP Right 11/16/2013   Procedure: IRRIGATION AND DEBRIDEMENT RIGHT HIP;  Surgeon: [name], MD;  [facility];  Service: Orthopedics;  Laterality: Right;   IR IMAGING GUIDED PORT INSERTION  06/22/2018   IR NEPHROSTOMY PLACEMENT LEFT  03/28/2019   IR URETERAL STENT LEFT NEW ACCESS W/O SEP NEPHROSTOMY CATH  10/24/2017   IR URETERAL STENT LEFT NEW ACCESS W/O SEP NEPHROSTOMY CATH  03/26/2019   JOINT REPLACEMENT     hip-right x2   KNEE SURGERY Bilateral 2012   Total knee replacements   NEPHROLITHOTOMY Right 02/08/2017   Procedure: NEPHROLITHOTOMY PERCUTANEOUS WITH SURGEON ACCESS;  Surgeon: Cleon Gustin, MD;   [facility];  Service: Urology;  Laterality: Right;   NEPHROLITHOTOMY Left 10/24/2017   Procedure: NEPHROLITHOTOMY PERCUTANEOUS;  Surgeon: Cleon Gustin, MD;  [facility];  Service: Urology;  Laterality: Left;   NEPHROLITHOTOMY Left 03/26/2019   Procedure: NEPHROLITHOTOMY PERCUTANEOUS;  Surgeon: Cleon Gustin, MD;  [facility];  Service: Urology;  Laterality: Left;  2 HRS   NEPHROLITHOTOMY Left 04/17/2019   Procedure: NEPHROLITHOTOMY PERCUTANEOUS;  Surgeon: Cleon Gustin, MD;  [facility];  Service: Urology;  Laterality: Left;  2 HRS   ORIF TIBIA FRACTURE Right 02/06/2013   Procedure: OPEN REDUCTION INTERNAL FIXATION (ORIF) TIBIA FRACTURE WITH IM ROD FIBULA;  Surgeon: Rozanna Box, MD;  Location: [place];  Service: Orthopedics;  Laterality: Right;   ORIF TIBIA FRACTURE Right 07/03/2013   Procedure: RIGHT TIBIA NON UNION REPAIR ;  Surgeon: Rozanna Box, MD;  Location: [place];  Service: Orthopedics;  Laterality: Right;   ORIF WRIST FRACTURE  10/02/2012   Procedure: OPEN REDUCTION INTERNAL FIXATION (ORIF) WRIST FRACTURE;  Surgeon: Roseanne Kaufman, MD;  [facility];  Service: Orthopedics;  Laterality: Right;  WITH   ANTIBIOTIC  CEMENT   ORIF WRIST FRACTURE Left 10/28/2013   Procedure: OPEN REDUCTION INTERNAL FIXATION (ORIF) WRIST FRACTURE with allograft;  Surgeon: Roseanne Kaufman, MD;  [facility];  Service: Orthopedics;  Laterality: Left;  DVR Plate   QUADRICEPS TENDON REPAIR Left 07/15/2017   Procedure: REPAIR QUADRICEP TENDON;  Surgeon: Frederik Pear, MD;  Location: [place];  Service: Orthopedics;  Laterality: Left;   right femur surgery  05/2012   THYROIDECTOMY  02/1986   TOTAL HIP REVISION  10/05/2012   Procedure: TOTAL HIP REVISION;  Surgeon: [name], MD;  [facility];  Service: Orthopedics;  Laterality: Right;  RIGHT TOTAL HIP REVISION   TOTAL HIP REVISION Right 09/17/2013   Procedure: REVISION RIGHT TOTAL HIP ARTHROPLASTY ;   Surgeon: [name], MD;  [facility];  Service: Orthopedics;  Laterality: Right;   TOTAL HIP REVISION Right 10/26/2013   Procedure: REVISION RIGHT TOTAL HIP ARTHROPLASTY;  Surgeon: [name], MD;  [facility];  Service: Orthopedics;  Laterality: Right;   TOTAL HIP REVISION  03/2016   TOTAL KNEE REVISION Left 04/11/2017   Procedure: TOTAL KNEE REVISION PATELLA and TIBIA;  Surgeon: Frederik Pear, MD;  Location: [place];  Service: Orthopedics;  Laterality: Left;   WRIST FRACTURE SURGERY  05/2012    SOCIAL HISTORY:  Social History   Socioeconomic History   Marital status: Married    Spouse name: Not on file   Number of children: Not on file   Years of education: Not on file   Highest education level: Not on file  Occupational History   Not on file  Social Needs   Financial resource strain: Not on file   Food insecurity    Worry: Not on file    Inability: Not on file   Transportation needs    Medical: Not on file    Non-medical: Not on file  Tobacco Use   Smoking status: Former Smoker    Quit date: 06/05/2012    Years since quitting: 7.1   Smokeless tobacco: Never Used  Substance and Sexual Activity   Alcohol use: Yes    Comment: occasional-social   Drug use: No   Sexual activity: Yes  Lifestyle   Physical activity    Days per week: Not on file    Minutes per session: Not on file   Stress: Not on file  Relationships   Social connections    Talks on phone: Not on file    Gets together: Not on file    Attends religious service: Not on file    Active member of club or organization: Not on file    Attends meetings of clubs or organizations: Not on file    Relationship status: Not on file   Intimate partner violence    Fear of current or ex partner: Not on file    Emotionally abused: Not on file    Physically abused: Not on file    Forced sexual activity: Not on file  Other Topics Concern   Not on file  Social History Narrative    [place] 2.5 months, went home Feb 21st slipped and fell on back and developed.  Home PT/OT.  Just started outpatient physical therapy.  Friday night, misstepped.      FAMILY HISTORY: Family History  Problem Relation Age of Onset   CAD Father 36   Asthma Father    Alcohol abuse Father    Arthritis Mother    Alcohol abuse Sister     ALLERGIES:  is allergic to short ragweed pollen ext.  MEDICATIONS:  Current Outpatient Medications  Medication Sig Dispense Refill   acetaminophen (TYLENOL) 500 MG tablet Take 1,000 mg by mouth daily as needed for moderate pain.     albuterol (PROVENTIL) (2.5 MG/3ML) 0.083% nebulizer solution Take 3 mLs (2.5 mg total) by nebulization every 4 (four) hours as needed for wheezing or shortness of breath. 75 mL 12   alfuzosin (UROXATRAL) 10 MG 24 hr tablet Take 10 mg by mouth at bedtime.     atorvastatin (LIPITOR) 80 MG tablet TAKE 1 TABLET BY MOUTH  DAILY 90 tablet 0   BELSOMRA 15 MG TABS TAKE 1 TABLET BY MOUTH  DAILY 90 tablet 1   buPROPion (WELLBUTRIN XL) 300 MG 24 hr tablet TAKE 1 TABLET BY MOUTH  DAILY WITH BREAKFAST 90 tablet 3   Calcium Citrate-Vitamin D (CALCIUM CITRATE + D PO) Take 1 tablet by mouth 2 (two) times daily.      cephALEXin (KEFLEX) 500 MG capsule Take 1 capsule (500 mg total) by mouth 4 (four) times daily. 28 capsule 0   Cholecalciferol (VITAMIN D-3 PO) Take 2,000 Units by mouth 2 (two) times daily.      clonazePAM (KLONOPIN) 1 MG tablet TAKE 1 TABLET BY MOUTH  EVERY 6 HOURS AS NEEDED  FOR ANXIETY 360 tablet 1   denosumab (PROLIA) 60 MG/ML SOSY injection Inject 60 mg as directed every 6 (six) months. Last injection: Feb 2020 Next injection: August 2020     diltiazem (CARDIZEM CD) 240 MG 24 hr capsule TAKE 1 CAPSULE BY MOUTH  DAILY 90 capsule 3   doxycycline (VIBRA-TABS) 100 MG tablet Take 1 tablet (100 mg total) by mouth daily. 90 tablet 3   DULoxetine (CYMBALTA) 60 MG capsule TAKE 1 CAPSULE BY MOUTH  TWICE DAILY 180 capsule  3   EPINEPHrine (EPIPEN 2-PAK) 0.3 mg/0.3 mL IJ SOAJ injection Inject 0.3 mg into the muscle daily as needed (for anaphylactic reaction).     ezetimibe (ZETIA) 10 MG tablet TAKE 1 TABLET BY MOUTH  DAILY 90 tablet 3   ferrous sulfate 325 (65 FE) MG tablet Take 325 mg by mouth 2 (two) times daily with a meal.     furosemide (LASIX) 20 MG tablet Take 1 tablet (20 mg total) by mouth daily. 30 tablet 0   HYDROcodone-acetaminophen (NORCO) 7.5-325 MG tablet Take 1 tablet by mouth 4 (four) times daily as needed for moderate pain. Please fill on 04/06/19 120 tablet 0   HYDROcodone-acetaminophen (NORCO) 7.5-325 MG tablet Take 1 tablet by mouth every 6 (six) hours as needed for severe pain. 120 tablet 0   HYDROcodone-acetaminophen (NORCO) 7.5-325 MG tablet Take 1 tablet by mouth 4 (four) times daily as needed for moderate pain. 120 tablet 0   levothyroxine (SYNTHROID) 200 MCG tablet TAKE 1 TABLET BY MOUTH  DAILY AT 6 AM 90 tablet 3   levothyroxine (SYNTHROID) 25 MCG tablet TAKE 1 TABLET BY MOUTH  DAILY BEFORE BREAKFAST 90 tablet 3   loratadine (CLARITIN) 10 MG tablet Take 10 mg by mouth daily.      Magnesium 500 MG CAPS Take 1 capsule (500 mg total) by mouth daily. 30 capsule 1   metoprolol tartrate (LOPRESSOR) 25 MG tablet TAKE 1 TABLET BY MOUTH TWO  TIMES DAILY 180 tablet 2   mirabegron ER (MYRBETRIQ) 25 MG TB24 tablet Take 25 mg by mouth at bedtime.      Multiple Vitamin (MULTIVITAMIN WITH MINERALS) TABS tablet Take 1 tablet by mouth daily. Men's One-A-Day 50+     nitroGLYCERIN (NITROSTAT) 0.4 MG SL tablet Place 0.4 mg under the tongue every 5 (five) minutes as needed for chest pain. x3 doses as needed for chest pain     omeprazole (PRILOSEC) 40 MG capsule TAKE 1 CAPSULE BY MOUTH  DAILY BEFORE BREAKFAST 90 capsule 3   ondansetron (ZOFRAN) 4 MG tablet Take 1 tablet (4 mg total) by mouth every 6 (six) hours as needed for nausea. 20 tablet 0   OTEZLA 30 MG TABS Take 30 mg by mouth 2 (two)  times a day.     oxyCODONE-acetaminophen (PERCOCET) 5-325 MG tablet Take 1 tablet by mouth every 4 (four) hours as needed for moderate pain or severe pain. 30 tablet 0   Probiotic Product (PROBIOTIC PO) Take 1 capsule by mouth daily with breakfast.     prochlorperazine (COMPAZINE) 10 MG tablet Take 1 tablet (10 mg total) by mouth every 6 (six) hours as needed (Nausea or vomiting). 30 tablet 6   testosterone enanthate (DELATESTRYL) 200 MG/ML injection INJECT 1ML (200MG ) INTO THE MUSCLE EVERY 14 DAYS. DISCARD VIAL AFTER 28 DAYS. 5 mL 3   tiZANidine (ZANAFLEX) 4 MG tablet TAKE 1 TABLET BY MOUTH  EVERY 6 HOURS AS NEEDED FOR MUSCLE SPASM(S) 360 tablet 0   vitamin  C (ASCORBIC ACID) 500 MG tablet Take 500 mg by mouth daily.     XARELTO 10 MG TABS tablet TAKE 1 TABLET BY MOUTH  DAILY 90 tablet 3   No current facility-administered medications for this visit.     REVIEW OF SYSTEMS:    A 10+ POINT REVIEW OF SYSTEMS WAS OBTAINED including neurology, dermatology, psychiatry, cardiac, respiratory, lymph, extremities, GI, GU, Musculoskeletal, constitutional, breasts, reproductive, HEENT.  All pertinent positives are noted in the HPI.  All others are negative.     PHYSICAL EXAMINATION: SKIN: chronic venous stasis changes in BLE, psoriasis rashes on elbows have improved with Otezla   ECOG FS:3 - Symptomatic, >50% confined to bed  There were no vitals filed for this visit. Wt Readings from Last 3 Encounters:  06/21/19 (!) 313 lb (142 kg)  06/15/19 291 lb (132 kg)  06/09/19 292 lb (132.5 kg)    There is no height or weight on file to calculate BMI.    Review was done from a chair  GENERAL:alert, in no acute distress and comfortable SKIN: area of psoariasis, no significant lesions EYES: conjunctiva are pink and non-injected, sclera anicteric OROPHARYNX: MMM, no exudates, no oropharyngeal erythema or ulceration NECK: supple, no JVD LYMPH:  no palpable lymphadenopathy in the cervical, axillary  or inguinal regions LUNGS: clear to auscultation b/l with normal respiratory effort HEART: regular rate & rhythm ABDOMEN:  normoactive bowel sounds , non tender, not distended. Extremity: +1 bilateral pedal edema PSYCH: alert & oriented x 3 with fluent speech NEURO: no focal motor/sensory deficits  LABORATORY DATA:  I have reviewed the data as listed  . CBC Latest Ref Rng & Units 08/09/2019 08/03/2019 06/15/2019  WBC 3.4 - 10.8 x10E3/uL 4.4 4.9 -  Hemoglobin 13.0 - 17.7 g/dL 13.0 12.8(L) 11.6(L)  Hematocrit 37.5 - 51.0 % 40.7 41.0 34.0(L)  Platelets 150 - 450 x10E3/uL 141(L) 134(L) -    . CMP Latest Ref Rng & Units 08/09/2019 08/03/2019 06/15/2019  Glucose 65 - 99 mg/dL 104(H) 116(H) -  BUN 8 - 27 mg/dL 18 20 -  Creatinine 0.76 - 1.27 mg/dL 1.12 1.17 -  Sodium 134 - 144 mmol/L 138 137 139  Potassium 3.5 - 5.2 mmol/L 3.9 3.8 3.8  Chloride 96 - 106 mmol/L 98 102 -  CO2 20 - 29 mmol/L 25 26 -  Calcium 8.6 - 10.2 mg/dL 9.6 9.1 -  Total Protein 6.5 - 8.1 g/dL - 7.1 -  Total Bilirubin 0.3 - 1.2 mg/dL - 1.1 -  Alkaline Phos 38 - 126 U/L - 75 -  AST 15 - 41 U/L - 23 -  ALT 0 - 44 U/L - 20 -   . Lab Results  Component Value Date   LDH 182 08/03/2019   06/28/2019 ECHOCARDIOGRAM COMPLETE (Accession [id number])   05/29/18 Left tonsil biopsy:   RADIOGRAPHIC STUDIES: I have personally reviewed the radiological images as listed and agreed with the findings in the report. No results found.  ASSESSMENT & PLAN:  73 y.o. male with  1. Diffuse Large B-Cell Lymphoma,- Stage III Left tonsil -05/29/18 Left tonsil biopsy which revealed Diffuse Large B-Cell Lymphoma, which requires treatment  -06/07/18 LDH at 214, will monitor change through treatment.  -Baseline ECHO 06/14/18: EF 50-55% -Standard regimen of R-CHOP with G-CSF support for 6 cycles starting 06/23/18  06/16/18 PET/CT revealed 1. Deauville 5 hypermetabolic lesions in the palatine tonsils, left internal jugular chain, and involving  a right inguinal lymph node. Deauville 4 involvement of a left  inguinal lymph node and an enlarged subcutaneous lesion favoring lymph node superficial to the lower right sternocleidomastoid muscle in the neck. 2. Other imaging findings of potential clinical significance: Aortic Atherosclerosis. Coronary atherosclerosis. Emphysema. Bilateral nonobstructive nephrolithiasis. Umbilical hernia contains adipose tissues. Old right rib fractures some of which are nonunited. 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   08/22/18 PET/CT revealed Interval response to therapy. Overall Deauville criteria 3. Interval decrease in size and FDG uptake associated with palatine tonsil lesions. There has been resolution of previous left level-II and bilateral inguinal lymph nodes. 2. Enlarged subcutaneous lesion superficial to the lower right sternocleidomastoid muscle exhibits decreased FDG uptake. Currently Deauville criteria 2. 3. No new or progressive sites of disease identified. 4.  Aortic Atherosclerosis. 5. Multi vessel coronary artery atherosclerotic calcifications. 6. Unchanged vertebra plana deformity. 7. Kidney stones.   S/p 6 cycles of R-CHOP completed on 10/12/18  11/22/18 PET/CT revealed The tonsillar mass has essentially resolved. The large presumed lymph node superficial to the right sternocleidomastoid is stable in size at 2.3 cm in short axis, with maximum SUV of 3.3 (formerly 2.4), representing Deauville 3 activity, previously Deauville 2. 2. Other imaging findings of potential clinical significance: Aortic Atherosclerosis and Emphysema. Coronary atherosclerosis. Bilateral nonobstructive nephrolithiasis. Umbilical hernia contains adipose tissue. Chronic high activity posterior to the right hip implant along a region of bony irregularity and deformity probably reflecting chronic inflammation. L1 vertebra plana.  2.  Patient Active Problem List   Diagnosis Date Noted   CHF (congestive heart failure) ([place]) 06/21/2019   H/O  nephrolithotomy with removal of calculi 03/26/2019   Renal calculus 03/26/2019   Generalized weakness 11/08/2018   Well adult exam 10/02/2018   Counseling regarding advance care planning and goals of care 07/09/2018   Diffuse large B-cell lymphoma ([place]) 06/23/2018   Port-A-Cath in place 06/23/2018   Hypogonadism in male 11/22/2017   Nephrolithiasis 10/24/2017   Recurrent dislocation of left patella 07/15/2017   Recurrent subluxation of patella, left 07/08/2017   S/P revision of total knee 04/11/2017   Failed total knee, left, initial encounter ([place]) 04/09/2017   Renal calculus, right 02/08/2017   Protein-calorie malnutrition, severe ([place]) 12/08/2016   Chronic obstructive pulmonary disease ([place]) 12/08/2016   Staghorn renal calculus 11/05/2016   Wound drainage right hip 11/15/2013   Expected blood loss anemia 09/18/2013   Hyponatremia 09/18/2013   S/P right TH revision 09/17/2013   Nonunion, fracture, Right tibia  07/03/2013   UTI (urinary tract infection) 07/03/2013   Pathological fracture due to osteoporosis with delayed healing 04/24/2013   Osteoporosis with fracture 04/24/2013   Rash and nonspecific skin eruption 03/08/2013   Syncope 02/21/2013   PAF (paroxysmal atrial fibrillation) ([place]) 02/21/2013   Chronic anticoagulation 02/21/2013   Fall 02/08/2013   Physical deconditioning 02/08/2013   Low back pain radiating to both legs 02/08/2013   Compression fracture of L1 lumbar vertebra ([place]) 02/08/2013   Right tibial fracture 02/05/2013   Obesity, unspecified 02/05/2013   Osteomyelitis of right wrist ([place]) 11/02/2012   Anemia 10/06/2012   HTN (hypertension) 10/03/2012   Psoriasis 10/03/2012   Dyslipidemia 10/03/2012   Vitamin D deficiency 10/03/2012   GERD (gastroesophageal reflux disease) 10/03/2012   Hyperglycemia 10/03/2012   Anxiety 10/03/2012   Depression 10/03/2012   CAD (coronary artery disease) 10/03/2012   DJD  (degenerative joint disease) 10/03/2012   Chronic gingivitis 10/03/2012   Low testosterone, possible hypogonadism 10/01/2012   Normocytic anemia 09/30/2012   Right femoral fracture ([place]) 09/29/2012   Right forearm fracture 09/29/2012  Atrial fibrillation ([place]) 09/29/2012   Hypothyroidism 09/29/2012   History of recurrent UTIs 09/29/2012   PLAN:   -Discussed pt labwork today, 08/03/19; CBC w/diff and CMP is as follows: all values are WNL except for  Hemoglobin at 12.8, RDW at 17.1, Platelets at 134, Glucose Bld at 116. -Discussed Blood chemistries have improved -Discussed LDH is normal  -Discussed that his hemoglobin has improved -no clinical or lab evidence of lymphoma recurrence/progression. -Discussed patients appointment with his PCP on Monday -Advised pt to still go outside but remain cautious by wearing mask and avoiding large crowds   FOLLOW UP: RTC with Dr Irene Limbo with labs in 26months  The total time spent in the appt was 20 minutes and more than 50% was on counseling and direct patient cares.  All of the patient's questions were answered with apparent satisfaction. The patient knows to call the clinic with any problems, questions or concerns.   Sullivan Lone MD [place] AAHIVMS [facility] Hematology/Oncology Physician [facility]  (Office):       [phone] (Work cell):  [phone] (Fax):           [phone]  08/02/2019 11:49 PM

## 2019-08-03 ENCOUNTER — Inpatient Hospital Stay (HOSPITAL_BASED_OUTPATIENT_CLINIC_OR_DEPARTMENT_OTHER): Payer: Medicare Other | Admitting: Hematology

## 2019-08-03 ENCOUNTER — Inpatient Hospital Stay: Payer: Medicare Other | Attending: Hematology

## 2019-08-03 ENCOUNTER — Other Ambulatory Visit: Payer: Self-pay

## 2019-08-03 ENCOUNTER — Telehealth: Payer: Self-pay | Admitting: Hematology

## 2019-08-03 VITALS — BP 153/86 | HR 82 | Temp 98.2°F | Resp 19 | Ht 75.0 in | Wt 301.1 lb

## 2019-08-03 DIAGNOSIS — C8330 Diffuse large B-cell lymphoma, unspecified site: Secondary | ICD-10-CM

## 2019-08-03 DIAGNOSIS — Z8572 Personal history of non-Hodgkin lymphomas: Secondary | ICD-10-CM | POA: Insufficient documentation

## 2019-08-03 LAB — CBC WITH DIFFERENTIAL/PLATELET
Abs Immature Granulocytes: 0.01 10*3/uL (ref 0.00–0.07)
Basophils Absolute: 0 10*3/uL (ref 0.0–0.1)
Basophils Relative: 1 %
Eosinophils Absolute: 0.1 10*3/uL (ref 0.0–0.5)
Eosinophils Relative: 3 %
HCT: 41 % (ref 39.0–52.0)
Hemoglobin: 12.8 g/dL — ABNORMAL LOW (ref 13.0–17.0)
Immature Granulocytes: 0 %
Lymphocytes Relative: 25 %
Lymphs Abs: 1.2 10*3/uL (ref 0.7–4.0)
MCH: 28.9 pg (ref 26.0–34.0)
MCHC: 31.2 g/dL (ref 30.0–36.0)
MCV: 92.6 fL (ref 80.0–100.0)
Monocytes Absolute: 0.4 10*3/uL (ref 0.1–1.0)
Monocytes Relative: 9 %
Neutro Abs: 3.1 10*3/uL (ref 1.7–7.7)
Neutrophils Relative %: 62 %
Platelets: 134 10*3/uL — ABNORMAL LOW (ref 150–400)
RBC: 4.43 MIL/uL (ref 4.22–5.81)
RDW: 17.1 % — ABNORMAL HIGH (ref 11.5–15.5)
WBC: 4.9 10*3/uL (ref 4.0–10.5)
nRBC: 0 % (ref 0.0–0.2)

## 2019-08-03 LAB — CMP (CANCER CENTER ONLY)
ALT: 20 U/L (ref 0–44)
AST: 23 U/L (ref 15–41)
Albumin: 3.7 g/dL (ref 3.5–5.0)
Alkaline Phosphatase: 75 U/L (ref 38–126)
Anion gap: 9 (ref 5–15)
BUN: 20 mg/dL (ref 8–23)
CO2: 26 mmol/L (ref 22–32)
Calcium: 9.1 mg/dL (ref 8.9–10.3)
Chloride: 102 mmol/L (ref 98–111)
Creatinine: 1.17 mg/dL (ref 0.61–1.24)
GFR, Est AFR Am: >60 mL/min (ref >60)
GFR, Estimated: >60 mL/min (ref >60)
Glucose, Bld: 116 mg/dL — ABNORMAL HIGH (ref 70–99)
Potassium: 3.8 mmol/L (ref 3.5–5.1)
Sodium: 137 mmol/L (ref 135–145)
Total Bilirubin: 1.1 mg/dL (ref 0.3–1.2)
Total Protein: 7.1 g/dL (ref 6.5–8.1)

## 2019-08-03 LAB — LACTATE DEHYDROGENASE: LDH: 182 U/L (ref 98–192)

## 2019-08-03 NOTE — Telephone Encounter (Signed)
Scheduled appt per 10/23 los.  Spoke with pt and he is aware of his appt date and time.

## 2019-08-06 ENCOUNTER — Telehealth: Payer: Self-pay | Admitting: Cardiovascular Disease

## 2019-08-06 ENCOUNTER — Other Ambulatory Visit: Payer: Self-pay

## 2019-08-06 ENCOUNTER — Ambulatory Visit: Payer: Medicare Other | Admitting: Cardiovascular Disease

## 2019-08-06 ENCOUNTER — Encounter: Payer: Self-pay | Admitting: Cardiovascular Disease

## 2019-08-06 VITALS — BP 164/85 | HR 86 | Ht 75.0 in | Wt 299.4 lb

## 2019-08-06 DIAGNOSIS — Z01812 Encounter for preprocedural laboratory examination: Secondary | ICD-10-CM

## 2019-08-06 DIAGNOSIS — I25110 Atherosclerotic heart disease of native coronary artery with unstable angina pectoris: Secondary | ICD-10-CM

## 2019-08-06 DIAGNOSIS — I209 Angina pectoris, unspecified: Secondary | ICD-10-CM

## 2019-08-06 DIAGNOSIS — Z5181 Encounter for therapeutic drug level monitoring: Secondary | ICD-10-CM | POA: Diagnosis not present

## 2019-08-06 DIAGNOSIS — I4819 Other persistent atrial fibrillation: Secondary | ICD-10-CM | POA: Diagnosis not present

## 2019-08-06 MED ORDER — HYDROCHLOROTHIAZIDE 25 MG PO TABS: 25.0000 mg | ORAL_TABLET | Freq: Every day | ORAL | 3 refills | Status: DC

## 2019-08-06 MED ORDER — SODIUM CHLORIDE 0.9% FLUSH: 3.0000 mL | Freq: Two times a day (BID) | INTRAVENOUS | Status: DC

## 2019-08-06 NOTE — Telephone Encounter (Signed)
New Message    Patient calling to schedule Heart Cath.

## 2019-08-06 NOTE — H&P (View-Only) (Signed)
Cardiology Office Note   Date:  08/06/2019   ID:  Joseph, Lane 09-22-1946, MRN [id number]  PCP:  Cassandria Anger, MD  Cardiologist:   Skeet Latch, MD   No chief complaint on file.   History of Present Illness: [name] is a 73 y.o. male with CAD, long-standing-persistent atrial fibrillation, hypertension, hyperlipidemia, sleep apnea on CPAP, diffuse large B cell lymphoma, hypothyroidism and prior tobacco abuse here for follow-up.  He was initially seen 05/2017 to establish care.  Mr. Lankford was previously a patient of [name] and last saw him 01/2016.  At that time he was preparing for left hip surgery.  [name] recommended stress testing but he declined.  He underwent surgery with no complications.  He was initially diagnosed with atrial fibrillation around 2012.  He was started on diltiazem and amiodarone.  He was noted to be in persistent atrial fibrillation.  Therefore amiodarone was discontinued. At the time of his atrial fibrillation diagnosis he also underwent a heart catheterization. He reportedly had 90% obstruction of an unknown vessel that had developed collaterals. He was started on Ranexa.  He had an echo 11/03/16 revealed LVEF of 60-65%. Severe left and right atrial enlargement was noted and the IVC was dilated.  He had no angina and so Ranexa was discontinued.  Consideration was made for starting a beta-blocker instead of a calcium channel blocker.  However we decided not to make too many changes at one time.  Mr. Branscome was diagnosed with diffuse large B cell lymphoma on 05/2018.  He completed 6 cycles of chemotherapy with rituximab, cyclophosphamide, doxorubicin, vincristine, and prednisone.  He had a baseline echo 06/2018 that revealed LVEF 50 to 55% and normal diastolic function.  He had apical hypokinesis and GLS was -13.9. Heart rate was poorly controlled so metoprolol was added to his regimen.  Lately he notices that his breathing has been  worse.  He was seen in he ED and BNP was mildly elevated to 283.  He was started on lasix 20mg  daily and has noticed an improvement in his breathing.  He was referred for an echo 06/28/19 that revealed LVEF 50-55%.  GLS was -10.1%. The RV had mildly reduced systolic function.   He sometimes has a dull ache in the epigastrum when he is short of breath.  Symptms are worse when he tries to go up stairs.  He denies nausea or diaphoresis.  He does not check his BP at home.    Past Medical History:  Diagnosis Date   A-fib ([place])    hx.- sinus rhythm now   Acid reflux    AKI (acute kidney injury) ([place])    mild   Anxiety    Arthritis    Atrial dilatation, bilateral    Severly, enlargement  per ECHO 11/03/16   BPH (benign prostatic hyperplasia)    CAD (coronary artery disease)    Cancer (HCC)    Depression    Dislocation of internal right hip prosthesis, sequela    Dysrhythmia    hx a fib   Elevated brain natriuretic peptide (BNP) level    Environmental allergies    Fracture of wrist 10/23/13   LEFT   High cholesterol    History of Graves' disease    History of kidney stones    surg. removal 02/2017   History of syncope    HLD (hyperlipidemia)    Hypertension    Hypothyroidism    thyroid removed  Left knee injury    cap dislocated   Low testosterone, possible hypogonadism 10/01/2012   Lumbago    Metabolic bone disease    Multiple falls    Osteoporosis    Severe   Other abnormalities of gait and mobility    Plaque psoriasis    Sleep apnea    cpap machine-settings 17   Thrombocytopenia (HCC)    Thyroid disease    HX GRAVES DISEASE   Transfusion history    s/p 12'13 hip surgery   Unsteady gait    UTI (urinary tract infection)    Vertebral compression fracture (HCC)    L1-wears brace    Past Surgical History:  Procedure Laterality Date   APPENDECTOMY     CARDIAC CATHETERIZATION  2013   CATARACT EXTRACTION, BILATERAL     COLONOSCOPY      CYSTOSCOPY WITH RETROGRADE PYELOGRAM, URETEROSCOPY AND STENT PLACEMENT Left 11/07/2017   Procedure: CYSTOSCOPY WITH RETROGRADE PYELOGRAM, URETEROSCOPY AND STENT PLACEMENT;  Surgeon: Cleon Gustin, MD;  Location: [facility];  Service: Urology;  Laterality: Left;   CYSTOSCOPY/URETEROSCOPY/HOLMIUM LASER/STENT PLACEMENT Right 03/11/2017   Procedure: CYSTOSCOPY/URETEROSCOPYSTENT PLACEMENT right ureter retrograde pylegram;  Surgeon: Cleon Gustin, MD;  Location: [facility];  Service: Urology;  Laterality: Right;   HARDWARE REMOVAL  10/05/2012   Procedure: HARDWARE REMOVAL;  Surgeon: [name], MD;  [facility];  Service: Orthopedics;  Laterality: Right;  REMOVING  STRYKER  GAMMA NAIL   HARDWARE REMOVAL Right 07/03/2013   Procedure: HARDWARE REMOVAL RIGHT TIBIA ;  Surgeon: Rozanna Box, MD;  Location: [place];  Service: Orthopedics;  Laterality: Right;   HIP CLOSED REDUCTION Right 10/15/2013   Procedure: CLOSED MANIPULATION HIP;  Surgeon: [name], MD;  [facility];  Service: Orthopedics;  Laterality: Right;   hip revision     June 2017   HOLMIUM LASER APPLICATION Right [id number]   Procedure: HOLMIUM LASER APPLICATION;  Surgeon: Cleon Gustin, MD;  [facility];  Service: Urology;  Laterality: Right;   HOLMIUM LASER APPLICATION Left [id number]   Procedure: HOLMIUM LASER APPLICATION;  Surgeon: Cleon Gustin, MD;  [facility];  Service: Urology;  Laterality: Left;   HOLMIUM LASER APPLICATION Left [id number]   Procedure: HOLMIUM LASER APPLICATION;  Surgeon: Cleon Gustin, MD;  [facility];  Service: Urology;  Laterality: Left;   INCISION AND DRAINAGE HIP Right 11/16/2013   Procedure: IRRIGATION AND DEBRIDEMENT RIGHT HIP;  Surgeon: [name], MD;  [facility];  Service: Orthopedics;  Laterality: Right;   IR IMAGING GUIDED PORT INSERTION  06/22/2018   IR NEPHROSTOMY PLACEMENT LEFT  03/28/2019   IR URETERAL STENT LEFT NEW ACCESS W/O SEP  NEPHROSTOMY CATH  10/24/2017   IR URETERAL STENT LEFT NEW ACCESS W/O SEP NEPHROSTOMY CATH  03/26/2019   JOINT REPLACEMENT     hip-right x2   KNEE SURGERY Bilateral 2012   Total knee replacements   NEPHROLITHOTOMY Right 02/08/2017   Procedure: NEPHROLITHOTOMY PERCUTANEOUS WITH SURGEON ACCESS;  Surgeon: Cleon Gustin, MD;  [facility];  Service: Urology;  Laterality: Right;   NEPHROLITHOTOMY Left 10/24/2017   Procedure: NEPHROLITHOTOMY PERCUTANEOUS;  Surgeon: Cleon Gustin, MD;  [facility];  Service: Urology;  Laterality: Left;   NEPHROLITHOTOMY Left 03/26/2019   Procedure: NEPHROLITHOTOMY PERCUTANEOUS;  Surgeon: Cleon Gustin, MD;  [facility];  Service: Urology;  Laterality: Left;  2 HRS   NEPHROLITHOTOMY Left 04/17/2019   Procedure: NEPHROLITHOTOMY PERCUTANEOUS;  Surgeon:  McKenzie, Candee Furbish, MD;  [facility];  Service: Urology;  Laterality: Left;  2 HRS   ORIF TIBIA FRACTURE Right 02/06/2013   Procedure: OPEN REDUCTION INTERNAL FIXATION (ORIF) TIBIA FRACTURE WITH IM ROD FIBULA;  Surgeon: Rozanna Box, MD;  Location: [place];  Service: Orthopedics;  Laterality: Right;   ORIF TIBIA FRACTURE Right 07/03/2013   Procedure: RIGHT TIBIA NON UNION REPAIR ;  Surgeon: Rozanna Box, MD;  Location: [place];  Service: Orthopedics;  Laterality: Right;   ORIF WRIST FRACTURE  10/02/2012   Procedure: OPEN REDUCTION INTERNAL FIXATION (ORIF) WRIST FRACTURE;  Surgeon: Roseanne Kaufman, MD;  [facility];  Service: Orthopedics;  Laterality: Right;  WITH   ANTIBIOTIC  CEMENT   ORIF WRIST FRACTURE Left 10/28/2013   Procedure: OPEN REDUCTION INTERNAL FIXATION (ORIF) WRIST FRACTURE with allograft;  Surgeon: Roseanne Kaufman, MD;  [facility];  Service: Orthopedics;  Laterality: Left;  DVR Plate   QUADRICEPS TENDON REPAIR Left 07/15/2017   Procedure: REPAIR QUADRICEP TENDON;  Surgeon: Frederik Pear, MD;  Location: [place];  Service: Orthopedics;  Laterality: Left;   right  femur surgery  05/2012   THYROIDECTOMY  02/1986   TOTAL HIP REVISION  10/05/2012   Procedure: TOTAL HIP REVISION;  Surgeon: [name], MD;  [facility];  Service: Orthopedics;  Laterality: Right;  RIGHT TOTAL HIP REVISION   TOTAL HIP REVISION Right 09/17/2013   Procedure: REVISION RIGHT TOTAL HIP ARTHROPLASTY ;  Surgeon: [name], MD;  [facility];  Service: Orthopedics;  Laterality: Right;   TOTAL HIP REVISION Right 10/26/2013   Procedure: REVISION RIGHT TOTAL HIP ARTHROPLASTY;  Surgeon: [name], MD;  [facility];  Service: Orthopedics;  Laterality: Right;   TOTAL HIP REVISION  03/2016   TOTAL KNEE REVISION Left 04/11/2017   Procedure: TOTAL KNEE REVISION PATELLA and TIBIA;  Surgeon: Frederik Pear, MD;  Location: [place];  Service: Orthopedics;  Laterality: Left;   WRIST FRACTURE SURGERY  05/2012     Current Outpatient Medications  Medication Sig Dispense Refill   acetaminophen (TYLENOL) 500 MG tablet Take 1,000 mg by mouth daily as needed for moderate pain.     albuterol (PROVENTIL) (2.5 MG/3ML) 0.083% nebulizer solution Take 3 mLs (2.5 mg total) by nebulization every 4 (four) hours as needed for wheezing or shortness of breath. 75 mL 12   alfuzosin (UROXATRAL) 10 MG 24 hr tablet Take 10 mg by mouth at bedtime.     atorvastatin (LIPITOR) 80 MG tablet TAKE 1 TABLET BY MOUTH  DAILY 90 tablet 0   BELSOMRA 15 MG TABS TAKE 1 TABLET BY MOUTH  DAILY 90 tablet 1   buPROPion (WELLBUTRIN XL) 300 MG 24 hr tablet TAKE 1 TABLET BY MOUTH  DAILY WITH BREAKFAST 90 tablet 3   Calcium Citrate-Vitamin D (CALCIUM CITRATE + D PO) Take 1 tablet by mouth 2 (two) times daily.      Cholecalciferol (VITAMIN D-3 PO) Take 2,000 Units by mouth 2 (two) times daily.      clonazePAM (KLONOPIN) 1 MG tablet TAKE 1 TABLET BY MOUTH  EVERY 6 HOURS AS NEEDED FOR ANXIETY 360 tablet 1   denosumab (PROLIA) 60 MG/ML SOSY injection Inject 60 mg as directed every 6 (six) months. Last  injection: Feb 2020 Next injection: August 2020     diltiazem (CARDIZEM CD) 240 MG 24 hr capsule TAKE 1 CAPSULE BY MOUTH  DAILY 90 capsule 3   doxycycline (VIBRA-TABS) 100 MG tablet  Take 1 tablet (100 mg total) by mouth daily. 90 tablet 3   DULoxetine (CYMBALTA) 60 MG capsule TAKE 1 CAPSULE BY MOUTH  TWICE DAILY 180 capsule 3   EPINEPHrine (EPIPEN 2-PAK) 0.3 mg/0.3 mL IJ SOAJ injection Inject 0.3 mg into the muscle daily as needed (for anaphylactic reaction).     ezetimibe (ZETIA) 10 MG tablet TAKE 1 TABLET BY MOUTH  DAILY 90 tablet 3   ferrous sulfate 325 (65 FE) MG tablet Take 325 mg by mouth 2 (two) times daily with a meal.     HYDROcodone-acetaminophen (NORCO) 7.5-325 MG tablet Take 1 tablet by mouth 4 (four) times daily as needed for moderate pain. 120 tablet 0   levothyroxine (SYNTHROID) 200 MCG tablet TAKE 1 TABLET BY MOUTH  DAILY AT 6 AM 90 tablet 3   levothyroxine (SYNTHROID) 25 MCG tablet TAKE 1 TABLET BY MOUTH  DAILY BEFORE BREAKFAST 90 tablet 3   loratadine (CLARITIN) 10 MG tablet Take 10 mg by mouth daily.      Magnesium 500 MG CAPS Take 1 capsule (500 mg total) by mouth daily. 30 capsule 1   metoprolol tartrate (LOPRESSOR) 25 MG tablet TAKE 1 TABLET BY MOUTH TWO  TIMES DAILY 180 tablet 2   mirabegron ER (MYRBETRIQ) 25 MG TB24 tablet Take 25 mg by mouth at bedtime.      Multiple Vitamin (MULTIVITAMIN WITH MINERALS) TABS tablet Take 1 tablet by mouth daily. Men's One-A-Day 50+     nitroGLYCERIN (NITROSTAT) 0.4 MG SL tablet Place 0.4 mg under the tongue every 5 (five) minutes as needed for chest pain. x3 doses as needed for chest pain     omeprazole (PRILOSEC) 40 MG capsule TAKE 1 CAPSULE BY MOUTH  DAILY BEFORE BREAKFAST 90 capsule 3   OTEZLA 30 MG TABS Take 30 mg by mouth 2 (two) times a day.     Probiotic Product (PROBIOTIC PO) Take 1 capsule by mouth daily with breakfast.     testosterone enanthate (DELATESTRYL) 200 MG/ML injection INJECT 1ML (200MG ) INTO THE  MUSCLE EVERY 14 DAYS. DISCARD VIAL AFTER 28 DAYS. 5 mL 3   tiZANidine (ZANAFLEX) 4 MG tablet TAKE 1 TABLET BY MOUTH  EVERY 6 HOURS AS NEEDED FOR MUSCLE SPASM(S) 360 tablet 0   vitamin C (ASCORBIC ACID) 500 MG tablet Take 500 mg by mouth daily.     XARELTO 10 MG TABS tablet TAKE 1 TABLET BY MOUTH  DAILY 90 tablet 3   hydrochlorothiazide (HYDRODIURIL) 25 MG tablet Take 1 tablet (25 mg total) by mouth daily. 90 tablet 3   No current facility-administered medications for this visit.     Allergies:   Short ragweed pollen ext    Social History:  The patient  reports that he quit smoking about 7 years ago. He has never used smokeless tobacco. He reports current alcohol use. He reports that he does not use drugs.   Family History:  The patient's family history includes Alcohol abuse in his father and sister; Arthritis in his mother; Asthma in his father; CAD (age of onset: 3) in his father.    ROS:  Please see the history of present illness.   Otherwise, review of systems are positive for none.   All other systems are reviewed and negative.    PHYSICAL EXAM: VS:  BP (!) 164/85    Pulse 86    Ht 6\' 3"  (1.905 m)    Wt 299 lb 6.4 oz (135.8 kg)    SpO2 93%  BMI 37.42 kg/m  , BMI Body mass index is 37.42 kg/m. GENERAL:  Well appearing HEENT: Pupils equal round and reactive, fundi not visualized, oral mucosa unremarkable NECK:  No jugular venous distention, waveform within normal limits, carotid upstroke brisk and symmetric, no bruits, no thyromegaly LYMPHATICS:  No cervical adenopathy LUNGS:  Clear to auscultation bilaterally HEART:  Irregularly irregular.  PMI not displaced or sustained,S1 and S2 within normal limits, no S3, no S4, no clicks, no rubs, no murmurs ABD:  Flat, positive bowel sounds normal in frequency in pitch, no bruits, no rebound, no guarding, no midline pulsatile mass, no hepatomegaly, no splenomegaly EXT:  2 plus pulses throughout, 1+ LE edema, no cyanosis no  clubbing SKIN:  No rashes no nodules NEURO:  Cranial nerves II through XII grossly intact, motor grossly intact throughout PSYCH:  Cognitively intact, oriented to person place and time   EKG:  EKG is ordered today. The ekg ordered today demonstrates Atrial fibrillation. Rate 71 bpm. Left axis deviation. Nonspecific IVCD. 12/26/17: Atrial fibrillation.  Rate 95 bpm.  Cannot rule out prior anterior infarct. 07/04/18: Atrial fibrillation.  Rate 118 bpm.  Right axis deviation. 08/06/19: Atrial fibrillation.  Rate 86 bpm.  Prior anteroseptal infarct.  Poor R wave progression.   Echo 11/03/16: Study Conclusions  - Left ventricle: Systolic function was normal. The estimated   ejection fraction was in the range of 60% to 65%. The study is   not technically sufficient to allow evaluation of LV diastolic   function. - Left atrium: Severely dilated. - Right atrium: Severely dilated.  Impressions:  - Technically difficult study with poor echo windows. The LVEF is   grossly normal at 60-65%, there is severe biatrial enlargment,   the IVC is dilated.   Recent Labs: 10/12/2018: Magnesium 1.6 11/09/2018: TSH 0.130 06/15/2019: B Natriuretic Peptide 283.8 08/03/2019: ALT 20; BUN 20; Creatinine 1.17; Hemoglobin 12.8; Platelets 134; Potassium 3.8; Sodium 137    Lipid Panel    Component Value Date/Time   CHOL 111 10/12/2018 0919   TRIG 64 10/12/2018 0919   HDL 56 10/12/2018 0919   CHOLHDL 2 10/02/2018 1114   VLDL 16.8 10/02/2018 1114   LDLCALC 42 10/12/2018 0919      Wt Readings from Last 3 Encounters:  08/06/19 299 lb 6.4 oz (135.8 kg)  08/03/19 (!) 301 lb 1.6 oz (136.6 kg)  06/21/19 (!) 313 lb (142 kg)      ASSESSMENT AND PLAN:  # Angina: # CAD: # Hyperlipidemia: Mr. Lojek reportedly has obstructive coronary artery disease with evidence of collateralization. He now has epigastric pain that occurs with exertional and is associated with exertional dyspnea.  We will get a left  heart catheterization to assess for progressive disease.  Ranexa was previously discontinued.  Continue atorvastatin.   His LDL remained above goal so Zetia was added.  Lipids well-controlled.  Hold Xarelto 3 days prior to cath.  Risks and benefits of cardiac catheterization have been discussed with the patient.  The patient understands that risks included but are not limited to stroke (1 in 1000), death (1 in 65), kidney failure [usually temporary] (1 in 500), bleeding (1 in 200), allergic reaction [possibly serious] (1 in 200). The patient understands and agrees to proceed.   # Hypertension:   # LE Edema: We will get a RHC at the time of his LHC.  He complains of urinating too much and BP is poorly controlled.  Switch lasix to HCTZ 25mg  daily.  Continue diltiazem and  metoprolol.  # Longstanding persistent atrial fibrillation: Rates are better controlled since adding metoprolol.  Continue diltiazem and Xarelto.  He will hold Xarelto for 3 days prior to cath.  # Morbid obesity:  Encouraged him to work with physical therapy as prescribed and try to walk when able.  # OSA: Continue CPAP   Current medicines are reviewed at length with the patient today.  The patient does not have concerns regarding medicines.  The following changes have been made: None  Labs/ tests ordered today include:   Orders Placed This Encounter  Procedures   CBC with Differential/Platelet   Basic metabolic panel   EKG [id number]     Disposition:   FU with Rondel Episcopo C. Oval Linsey, MD, [facility] in 3 months.   APP in 1 month.      Signed, Stesha Neyens C. Oval Linsey, MD, [facility]  08/06/2019 5:41 PM    [place]

## 2019-08-06 NOTE — Patient Instructions (Signed)
Medication Instructions:  STOP FUROSEMIDE   START HYDROCHLOROTHIAZIDE 25 MG DAILY   HOLD YOUR XARELTO 3 DAYS PRIOR TO YOUR CATH   *If you need a refill on your cardiac medications before your next appointment, please call your pharmacy*  Lab Work: CBC/BMET 3 DAYS PRIOR TO YOUR CATH BEFORE YOU GO FOR YOUR COVID SCREENING  COVID SCREENING 3 DAYS PRIOR TO YOUR CATH AT [facility]   If you have labs (blood work) drawn today and your tests are completely normal, you will receive your results only by: [name] MyChart Message (if you have MyChart) OR . A paper copy in the mail If you have any lab test that is abnormal or we need to change your treatment, we will call you to review the results.  Testing/Procedures: Your physician has requested that you have a cardiac catheterization. Cardiac catheterization is used to diagnose and/or treat various heart conditions. Doctors may recommend this procedure for a number of different reasons. The most common reason is to evaluate chest pain. Chest pain can be a symptom of coronary artery disease (CAD), and cardiac catheterization can show whether plaque is narrowing or blocking your heart's arteries. This procedure is also used to evaluate the valves, as well as measure the blood flow and oxygen levels in different parts of your heart. For further information please visit [email or web address]. Please follow instruction sheet, as given. CALL THE OFFICE AT [phone] WHEN YOU HAVE DATES THAT WILL WORK FOR YOU   Follow-Up: At [facility], you and your health needs are our priority.  As part of our continuing mission to provide you with exceptional heart care, we have created designated Provider Care Teams.  These Care Teams include your primary Cardiologist (physician) and Advanced Practice Providers (APPs -  Physician Assistants and Nurse Practitioners) who all work together to provide you with the care you need, when you need it.  Your next  appointment:   Your physician recommends that you schedule a follow-up appointment in: 1 MONTH WITH PA/NP   3 months IN PERSON WITH DR [facility]

## 2019-08-06 NOTE — Progress Notes (Signed)
Cardiology Office Note   Date:  08/06/2019   ID:  [name], [name] October 31, 1945, MRN [id number]  PCP:  Cassandria Anger, MD  Cardiologist:   Skeet Latch, MD   No chief complaint on file.   History of Present Illness: [name] is a 73 y.o. male with CAD, long-standing-persistent atrial fibrillation, hypertension, hyperlipidemia, sleep apnea on CPAP, diffuse large B cell lymphoma, hypothyroidism and prior tobacco abuse here for follow-up.  He was initially seen 05/2017 to establish care.  [name] was previously a patient of [name] and last saw him 01/2016.  At that time he was preparing for left hip surgery.  [name] recommended stress testing but he declined.  He underwent surgery with no complications.  He was initially diagnosed with atrial fibrillation around 2012.  He was started on diltiazem and amiodarone.  He was noted to be in persistent atrial fibrillation.  Therefore amiodarone was discontinued. At the time of his atrial fibrillation diagnosis he also underwent a heart catheterization. He reportedly had 90% obstruction of an unknown vessel that had developed collaterals. He was started on Ranexa.  He had an echo 11/03/16 revealed LVEF of 60-65%. Severe left and right atrial enlargement was noted and the IVC was dilated.  He had no angina and so Ranexa was discontinued.  Consideration was made for starting a beta-blocker instead of a calcium channel blocker.  However we decided not to make too many changes at one time.  [name] was diagnosed with diffuse large B cell lymphoma on 05/2018.  He completed 6 cycles of chemotherapy with rituximab, cyclophosphamide, doxorubicin, vincristine, and prednisone.  He had a baseline echo 06/2018 that revealed LVEF 50 to 55% and normal diastolic function.  He had apical hypokinesis and GLS was -13.9. Heart rate was poorly controlled so metoprolol was added to his regimen.  Lately he notices that his breathing has been  worse.  He was seen in he ED and BNP was mildly elevated to 283.  He was started on lasix 20mg  daily and has noticed an improvement in his breathing.  He was referred for an echo 06/28/19 that revealed LVEF 50-55%.  GLS was -10.1%. The RV had mildly reduced systolic function.   He sometimes has a dull ache in the epigastrum when he is short of breath.  Symptms are worse when he tries to go up stairs.  He denies nausea or diaphoresis.  He does not check his BP at home.    Past Medical History:  Diagnosis Date   A-fib ([place])    hx.- sinus rhythm now   Acid reflux    AKI (acute kidney injury) ([place])    mild   Anxiety    Arthritis    Atrial dilatation, bilateral    Severly, enlargement  per ECHO 11/03/16   BPH (benign prostatic hyperplasia)    CAD (coronary artery disease)    Cancer (HCC)    Depression    Dislocation of internal right hip prosthesis, sequela    Dysrhythmia    hx a fib   Elevated brain natriuretic peptide (BNP) level    Environmental allergies    Fracture of wrist 10/23/13   LEFT   High cholesterol    History of Graves' disease    History of kidney stones    surg. removal 02/2017   History of syncope    HLD (hyperlipidemia)    Hypertension    Hypothyroidism    thyroid removed  Left knee injury    cap dislocated   Low testosterone, possible hypogonadism 10/01/2012   Lumbago    Metabolic bone disease    Multiple falls    Osteoporosis    Severe   Other abnormalities of gait and mobility    Plaque psoriasis    Sleep apnea    cpap machine-settings 17   Thrombocytopenia (HCC)    Thyroid disease    HX GRAVES DISEASE   Transfusion history    s/p 12'13 hip surgery   Unsteady gait    UTI (urinary tract infection)    Vertebral compression fracture (HCC)    L1-wears brace    Past Surgical History:  Procedure Laterality Date   APPENDECTOMY     CARDIAC CATHETERIZATION  2013   CATARACT EXTRACTION, BILATERAL     COLONOSCOPY      CYSTOSCOPY WITH RETROGRADE PYELOGRAM, URETEROSCOPY AND STENT PLACEMENT Left 11/07/2017   Procedure: CYSTOSCOPY WITH RETROGRADE PYELOGRAM, URETEROSCOPY AND STENT PLACEMENT;  Surgeon: Cleon Gustin, MD;  Location: [facility];  Service: Urology;  Laterality: Left;   CYSTOSCOPY/URETEROSCOPY/HOLMIUM LASER/STENT PLACEMENT Right 03/11/2017   Procedure: CYSTOSCOPY/URETEROSCOPYSTENT PLACEMENT right ureter retrograde pylegram;  Surgeon: Cleon Gustin, MD;  Location: [facility];  Service: Urology;  Laterality: Right;   HARDWARE REMOVAL  10/05/2012   Procedure: HARDWARE REMOVAL;  Surgeon: [name], MD;  [facility];  Service: Orthopedics;  Laterality: Right;  REMOVING  STRYKER  GAMMA NAIL   HARDWARE REMOVAL Right 07/03/2013   Procedure: HARDWARE REMOVAL RIGHT TIBIA ;  Surgeon: Rozanna Box, MD;  Location: [place];  Service: Orthopedics;  Laterality: Right;   HIP CLOSED REDUCTION Right 10/15/2013   Procedure: CLOSED MANIPULATION HIP;  Surgeon: [name], MD;  [facility];  Service: Orthopedics;  Laterality: Right;   hip revision     June 2017   HOLMIUM LASER APPLICATION Right [id number]   Procedure: HOLMIUM LASER APPLICATION;  Surgeon: Cleon Gustin, MD;  [facility];  Service: Urology;  Laterality: Right;   HOLMIUM LASER APPLICATION Left [id number]   Procedure: HOLMIUM LASER APPLICATION;  Surgeon: Cleon Gustin, MD;  [facility];  Service: Urology;  Laterality: Left;   HOLMIUM LASER APPLICATION Left [id number]   Procedure: HOLMIUM LASER APPLICATION;  Surgeon: Cleon Gustin, MD;  [facility];  Service: Urology;  Laterality: Left;   INCISION AND DRAINAGE HIP Right 11/16/2013   Procedure: IRRIGATION AND DEBRIDEMENT RIGHT HIP;  Surgeon: [name], MD;  [facility];  Service: Orthopedics;  Laterality: Right;   IR IMAGING GUIDED PORT INSERTION  06/22/2018   IR NEPHROSTOMY PLACEMENT LEFT  03/28/2019   IR URETERAL STENT LEFT NEW ACCESS W/O SEP  NEPHROSTOMY CATH  10/24/2017   IR URETERAL STENT LEFT NEW ACCESS W/O SEP NEPHROSTOMY CATH  03/26/2019   JOINT REPLACEMENT     hip-right x2   KNEE SURGERY Bilateral 2012   Total knee replacements   NEPHROLITHOTOMY Right 02/08/2017   Procedure: NEPHROLITHOTOMY PERCUTANEOUS WITH SURGEON ACCESS;  Surgeon: Cleon Gustin, MD;  [facility];  Service: Urology;  Laterality: Right;   NEPHROLITHOTOMY Left 10/24/2017   Procedure: NEPHROLITHOTOMY PERCUTANEOUS;  Surgeon: Cleon Gustin, MD;  [facility];  Service: Urology;  Laterality: Left;   NEPHROLITHOTOMY Left 03/26/2019   Procedure: NEPHROLITHOTOMY PERCUTANEOUS;  Surgeon: Cleon Gustin, MD;  [facility];  Service: Urology;  Laterality: Left;  2 HRS   NEPHROLITHOTOMY Left 04/17/2019   Procedure: NEPHROLITHOTOMY PERCUTANEOUS;  Surgeon:  McKenzie, Candee Furbish, MD;  [facility];  Service: Urology;  Laterality: Left;  2 HRS   ORIF TIBIA FRACTURE Right 02/06/2013   Procedure: OPEN REDUCTION INTERNAL FIXATION (ORIF) TIBIA FRACTURE WITH IM ROD FIBULA;  Surgeon: Rozanna Box, MD;  Location: [place];  Service: Orthopedics;  Laterality: Right;   ORIF TIBIA FRACTURE Right 07/03/2013   Procedure: RIGHT TIBIA NON UNION REPAIR ;  Surgeon: Rozanna Box, MD;  Location: [place];  Service: Orthopedics;  Laterality: Right;   ORIF WRIST FRACTURE  10/02/2012   Procedure: OPEN REDUCTION INTERNAL FIXATION (ORIF) WRIST FRACTURE;  Surgeon: Roseanne Kaufman, MD;  [facility];  Service: Orthopedics;  Laterality: Right;  WITH   ANTIBIOTIC  CEMENT   ORIF WRIST FRACTURE Left 10/28/2013   Procedure: OPEN REDUCTION INTERNAL FIXATION (ORIF) WRIST FRACTURE with allograft;  Surgeon: Roseanne Kaufman, MD;  [facility];  Service: Orthopedics;  Laterality: Left;  DVR Plate   QUADRICEPS TENDON REPAIR Left 07/15/2017   Procedure: REPAIR QUADRICEP TENDON;  Surgeon: Frederik Pear, MD;  Location: [place];  Service: Orthopedics;  Laterality: Left;   right  femur surgery  05/2012   THYROIDECTOMY  02/1986   TOTAL HIP REVISION  10/05/2012   Procedure: TOTAL HIP REVISION;  Surgeon: [name], MD;  [facility];  Service: Orthopedics;  Laterality: Right;  RIGHT TOTAL HIP REVISION   TOTAL HIP REVISION Right 09/17/2013   Procedure: REVISION RIGHT TOTAL HIP ARTHROPLASTY ;  Surgeon: [name], MD;  [facility];  Service: Orthopedics;  Laterality: Right;   TOTAL HIP REVISION Right 10/26/2013   Procedure: REVISION RIGHT TOTAL HIP ARTHROPLASTY;  Surgeon: [name], MD;  [facility];  Service: Orthopedics;  Laterality: Right;   TOTAL HIP REVISION  03/2016   TOTAL KNEE REVISION Left 04/11/2017   Procedure: TOTAL KNEE REVISION PATELLA and TIBIA;  Surgeon: Frederik Pear, MD;  Location: [place];  Service: Orthopedics;  Laterality: Left;   WRIST FRACTURE SURGERY  05/2012     Current Outpatient Medications  Medication Sig Dispense Refill   acetaminophen (TYLENOL) 500 MG tablet Take 1,000 mg by mouth daily as needed for moderate pain.     albuterol (PROVENTIL) (2.5 MG/3ML) 0.083% nebulizer solution Take 3 mLs (2.5 mg total) by nebulization every 4 (four) hours as needed for wheezing or shortness of breath. 75 mL 12   alfuzosin (UROXATRAL) 10 MG 24 hr tablet Take 10 mg by mouth at bedtime.     atorvastatin (LIPITOR) 80 MG tablet TAKE 1 TABLET BY MOUTH  DAILY 90 tablet 0   BELSOMRA 15 MG TABS TAKE 1 TABLET BY MOUTH  DAILY 90 tablet 1   buPROPion (WELLBUTRIN XL) 300 MG 24 hr tablet TAKE 1 TABLET BY MOUTH  DAILY WITH BREAKFAST 90 tablet 3   Calcium Citrate-Vitamin D (CALCIUM CITRATE + D PO) Take 1 tablet by mouth 2 (two) times daily.      Cholecalciferol (VITAMIN D-3 PO) Take 2,000 Units by mouth 2 (two) times daily.      clonazePAM (KLONOPIN) 1 MG tablet TAKE 1 TABLET BY MOUTH  EVERY 6 HOURS AS NEEDED FOR ANXIETY 360 tablet 1   denosumab (PROLIA) 60 MG/ML SOSY injection Inject 60 mg as directed every 6 (six) months. Last  injection: Feb 2020 Next injection: August 2020     diltiazem (CARDIZEM CD) 240 MG 24 hr capsule TAKE 1 CAPSULE BY MOUTH  DAILY 90 capsule 3   doxycycline (VIBRA-TABS) 100 MG tablet  Take 1 tablet (100 mg total) by mouth daily. 90 tablet 3   DULoxetine (CYMBALTA) 60 MG capsule TAKE 1 CAPSULE BY MOUTH  TWICE DAILY 180 capsule 3   EPINEPHrine (EPIPEN 2-PAK) 0.3 mg/0.3 mL IJ SOAJ injection Inject 0.3 mg into the muscle daily as needed (for anaphylactic reaction).     ezetimibe (ZETIA) 10 MG tablet TAKE 1 TABLET BY MOUTH  DAILY 90 tablet 3   ferrous sulfate 325 (65 FE) MG tablet Take 325 mg by mouth 2 (two) times daily with a meal.     HYDROcodone-acetaminophen (NORCO) 7.5-325 MG tablet Take 1 tablet by mouth 4 (four) times daily as needed for moderate pain. 120 tablet 0   levothyroxine (SYNTHROID) 200 MCG tablet TAKE 1 TABLET BY MOUTH  DAILY AT 6 AM 90 tablet 3   levothyroxine (SYNTHROID) 25 MCG tablet TAKE 1 TABLET BY MOUTH  DAILY BEFORE BREAKFAST 90 tablet 3   loratadine (CLARITIN) 10 MG tablet Take 10 mg by mouth daily.      Magnesium 500 MG CAPS Take 1 capsule (500 mg total) by mouth daily. 30 capsule 1   metoprolol tartrate (LOPRESSOR) 25 MG tablet TAKE 1 TABLET BY MOUTH TWO  TIMES DAILY 180 tablet 2   mirabegron ER (MYRBETRIQ) 25 MG TB24 tablet Take 25 mg by mouth at bedtime.      Multiple Vitamin (MULTIVITAMIN WITH MINERALS) TABS tablet Take 1 tablet by mouth daily. Men's One-A-Day 50+     nitroGLYCERIN (NITROSTAT) 0.4 MG SL tablet Place 0.4 mg under the tongue every 5 (five) minutes as needed for chest pain. x3 doses as needed for chest pain     omeprazole (PRILOSEC) 40 MG capsule TAKE 1 CAPSULE BY MOUTH  DAILY BEFORE BREAKFAST 90 capsule 3   OTEZLA 30 MG TABS Take 30 mg by mouth 2 (two) times a day.     Probiotic Product (PROBIOTIC PO) Take 1 capsule by mouth daily with breakfast.     testosterone enanthate (DELATESTRYL) 200 MG/ML injection INJECT 1ML (200MG ) INTO THE  MUSCLE EVERY 14 DAYS. DISCARD VIAL AFTER 28 DAYS. 5 mL 3   tiZANidine (ZANAFLEX) 4 MG tablet TAKE 1 TABLET BY MOUTH  EVERY 6 HOURS AS NEEDED FOR MUSCLE SPASM(S) 360 tablet 0   vitamin C (ASCORBIC ACID) 500 MG tablet Take 500 mg by mouth daily.     XARELTO 10 MG TABS tablet TAKE 1 TABLET BY MOUTH  DAILY 90 tablet 3   hydrochlorothiazide (HYDRODIURIL) 25 MG tablet Take 1 tablet (25 mg total) by mouth daily. 90 tablet 3   No current facility-administered medications for this visit.     Allergies:   Short ragweed pollen ext    Social History:  The patient  reports that he quit smoking about 7 years ago. He has never used smokeless tobacco. He reports current alcohol use. He reports that he does not use drugs.   Family History:  The patient's family history includes Alcohol abuse in his father and sister; Arthritis in his mother; Asthma in his father; CAD (age of onset: 2) in his father.    ROS:  Please see the history of present illness.   Otherwise, review of systems are positive for none.   All other systems are reviewed and negative.    PHYSICAL EXAM: VS:  BP (!) 164/85    Pulse 86    Ht 6\' 3"  (1.905 m)    Wt 299 lb 6.4 oz (135.8 kg)    SpO2 93%  BMI 37.42 kg/m  , BMI Body mass index is 37.42 kg/m. GENERAL:  Well appearing HEENT: Pupils equal round and reactive, fundi not visualized, oral mucosa unremarkable NECK:  No jugular venous distention, waveform within normal limits, carotid upstroke brisk and symmetric, no bruits, no thyromegaly LYMPHATICS:  No cervical adenopathy LUNGS:  Clear to auscultation bilaterally HEART:  Irregularly irregular.  PMI not displaced or sustained,S1 and S2 within normal limits, no S3, no S4, no clicks, no rubs, no murmurs ABD:  Flat, positive bowel sounds normal in frequency in pitch, no bruits, no rebound, no guarding, no midline pulsatile mass, no hepatomegaly, no splenomegaly EXT:  2 plus pulses throughout, 1+ LE edema, no cyanosis no  clubbing SKIN:  No rashes no nodules NEURO:  Cranial nerves II through XII grossly intact, motor grossly intact throughout PSYCH:  Cognitively intact, oriented to person place and time   EKG:  EKG is ordered today. The ekg ordered today demonstrates Atrial fibrillation. Rate 71 bpm. Left axis deviation. Nonspecific IVCD. 12/26/17: Atrial fibrillation.  Rate 95 bpm.  Cannot rule out prior anterior infarct. 07/04/18: Atrial fibrillation.  Rate 118 bpm.  Right axis deviation. 08/06/19: Atrial fibrillation.  Rate 86 bpm.  Prior anteroseptal infarct.  Poor R wave progression.   Echo 11/03/16: Study Conclusions  - Left ventricle: Systolic function was normal. The estimated   ejection fraction was in the range of 60% to 65%. The study is   not technically sufficient to allow evaluation of LV diastolic   function. - Left atrium: Severely dilated. - Right atrium: Severely dilated.  Impressions:  - Technically difficult study with poor echo windows. The LVEF is   grossly normal at 60-65%, there is severe biatrial enlargment,   the IVC is dilated.   Recent Labs: 10/12/2018: Magnesium 1.6 11/09/2018: TSH 0.130 06/15/2019: B Natriuretic Peptide 283.8 08/03/2019: ALT 20; BUN 20; Creatinine 1.17; Hemoglobin 12.8; Platelets 134; Potassium 3.8; Sodium 137    Lipid Panel    Component Value Date/Time   CHOL 111 10/12/2018 0919   TRIG 64 10/12/2018 0919   HDL 56 10/12/2018 0919   CHOLHDL 2 10/02/2018 1114   VLDL 16.8 10/02/2018 1114   LDLCALC 42 10/12/2018 0919      Wt Readings from Last 3 Encounters:  08/06/19 299 lb 6.4 oz (135.8 kg)  08/03/19 (!) 301 lb 1.6 oz (136.6 kg)  06/21/19 (!) 313 lb (142 kg)      ASSESSMENT AND PLAN:  # Angina: # CAD: # Hyperlipidemia: [name] reportedly has obstructive coronary artery disease with evidence of collateralization. He now has epigastric pain that occurs with exertional and is associated with exertional dyspnea.  We will get a left  heart catheterization to assess for progressive disease.  Ranexa was previously discontinued.  Continue atorvastatin.   His LDL remained above goal so Zetia was added.  Lipids well-controlled.  Hold Xarelto 3 days prior to cath.  Risks and benefits of cardiac catheterization have been discussed with the patient.  The patient understands that risks included but are not limited to stroke (1 in 1000), death (1 in 55), kidney failure [usually temporary] (1 in 500), bleeding (1 in 200), allergic reaction [possibly serious] (1 in 200). The patient understands and agrees to proceed.   # Hypertension:   # LE Edema: We will get a RHC at the time of his LHC.  He complains of urinating too much and BP is poorly controlled.  Switch lasix to HCTZ 25mg  daily.  Continue diltiazem and  metoprolol.  # Longstanding persistent atrial fibrillation: Rates are better controlled since adding metoprolol.  Continue diltiazem and Xarelto.  He will hold Xarelto for 3 days prior to cath.  # Morbid obesity:  Encouraged him to work with physical therapy as prescribed and try to walk when able.  # OSA: Continue CPAP   Current medicines are reviewed at length with the patient today.  The patient does not have concerns regarding medicines.  The following changes have been made: None  Labs/ tests ordered today include:   Orders Placed This Encounter  Procedures   CBC with Differential/Platelet   Basic metabolic panel   EKG [id number]     Disposition:   FU with Jamyah Folk C. Oval Linsey, MD, [facility] in 3 months.   APP in 1 month.      Signed, Jaisean Monteforte C. Oval Linsey, MD, [facility]  08/06/2019 5:41 PM    [place]

## 2019-08-06 NOTE — Telephone Encounter (Signed)
Spoke with patient and he is good with cath any day next week  Will call tomorrow to arrange

## 2019-08-07 ENCOUNTER — Encounter (INDEPENDENT_AMBULATORY_CARE_PROVIDER_SITE_OTHER): Payer: Self-pay

## 2019-08-07 NOTE — Telephone Encounter (Signed)
Patient is scheduled on Monday 08/13/19 arrive at 8:30 for 10:30 cath with Dr End. Patient will have COVID 19 screening and labs on Thursday 10/29. Patient aware of date, time, location, and COVID screening prior

## 2019-08-07 NOTE — Telephone Encounter (Signed)
Left message to call back  

## 2019-08-09 ENCOUNTER — Telehealth: Payer: Self-pay | Admitting: *Deleted

## 2019-08-09 ENCOUNTER — Inpatient Hospital Stay (HOSPITAL_COMMUNITY)
Admission: RE | Admit: 2019-08-09 | Discharge: 2019-08-09 | Disposition: A | Discharge status: Home / Self Care | Payer: Medicare Other | Source: Ambulatory Visit

## 2019-08-09 ENCOUNTER — Telehealth: Payer: Self-pay | Admitting: Cardiovascular Disease

## 2019-08-09 LAB — CBC WITH DIFFERENTIAL/PLATELET
Basophils Absolute: 0 10*3/uL (ref 0.0–0.2)
Basos: 1 %
EOS (ABSOLUTE): 0.1 10*3/uL (ref 0.0–0.4)
Eos: 3 %
Hematocrit: 40.7 % (ref 37.5–51.0)
Hemoglobin: 13 g/dL (ref 13.0–17.7)
Immature Grans (Abs): 0 10*3/uL (ref 0.0–0.1)
Immature Granulocytes: 0 %
Lymphocytes Absolute: 1.5 10*3/uL (ref 0.7–3.1)
Lymphs: 33 %
MCH: 29.1 pg (ref 26.6–33.0)
MCHC: 31.9 g/dL (ref 31.5–35.7)
MCV: 91 fL (ref 79–97)
Monocytes Absolute: 0.3 10*3/uL (ref 0.1–0.9)
Monocytes: 8 %
Neutrophils Absolute: 2.4 10*3/uL (ref 1.4–7.0)
Neutrophils: 55 %
Platelets: 141 10*3/uL — ABNORMAL LOW (ref 150–450)
RBC: 4.47 x10E6/uL (ref 4.14–5.80)
RDW: 15.5 % — ABNORMAL HIGH (ref 11.6–15.4)
WBC: 4.4 10*3/uL (ref 3.4–10.8)

## 2019-08-09 LAB — BASIC METABOLIC PANEL
BUN/Creatinine Ratio: 16 (ref 10–24)
BUN: 18 mg/dL (ref 8–27)
CO2: 25 mmol/L (ref 20–29)
Calcium: 9.6 mg/dL (ref 8.6–10.2)
Chloride: 98 mmol/L (ref 96–106)
Creatinine, Ser: 1.12 mg/dL (ref 0.76–1.27)
GFR calc Af Amer: 75 mL/min/{1.73_m2} (ref >59)
GFR calc non Af Amer: 65 mL/min/{1.73_m2} (ref >59)
Glucose: 104 mg/dL — ABNORMAL HIGH (ref 65–99)
Potassium: 3.9 mmol/L (ref 3.5–5.2)
Sodium: 138 mmol/L (ref 134–144)

## 2019-08-09 NOTE — Telephone Encounter (Signed)
  Patient showed up for his covid screening and it had already closed. He states no one has called him regarding rescheduling. Please advise.

## 2019-08-09 NOTE — Progress Notes (Signed)
Attempted to call patient to inform that [facility] covid site has closed due to inclement weather. Left voicemail to call back to reschedule for 10/30 or 10/31.

## 2019-08-09 NOTE — Telephone Encounter (Signed)
Pt contacted pre-catheterization scheduled at [facility] for: Monday August 13, 2019 10:30 AM Verified arrival time and place: [place] [facility]) at: 8:30 AM   No solid food after midnight prior to cath, clear liquids until 5 AM day of procedure. Contrast allergy: no  Hold: Xarelto-3 days prior to procedure per Dr Oval Linsey. HCTZ-AM of procedure  Except hold medications AM meds can be  taken pre-cath with sip of water including: ASA 81 mg   Confirmed patient has responsible adult to drive home post procedure and observe 24 hours after arriving home: yes  Currently, due to Covid-19 pandemic, only one support person will be allowed with patient. Must be the same support person for that patient's entire stay, will be screened and required to wear a mask. They will be asked to wait in the waiting room for the duration of the patient's stay.  Patients are required to wear a mask when they enter the hospital.      COVID-19 Pre-Screening Questions:  . In the past 7 to 10 days have you had a cough,  shortness of breath, headache, congestion, fever (100 or greater) body aches, chills, sore throat, or sudden loss of taste or sense of smell? no . Have you been around anyone with known Covid 19? no . Have you been around anyone who is awaiting Covid 19 test results in the past 7 to 10 days? no . Have you been around anyone who has been exposed to Covid 19, or has mentioned symptoms of Covid 19 within the past 7 to 10 days? no  I reviewed procedure/mask/visitor instructions, Covid-19 screening questions with patient, he verbalized understanding.

## 2019-08-09 NOTE — Telephone Encounter (Signed)
The patient has been notified that the site had to close down due to the weather. He has been rescheduled for 10/30 at 10:40 am. He has verbalized his understanding.

## 2019-08-10 ENCOUNTER — Telehealth: Payer: Self-pay | Admitting: *Deleted

## 2019-08-10 ENCOUNTER — Other Ambulatory Visit (HOSPITAL_COMMUNITY)
Admission: RE | Admit: 2019-08-10 | Discharge: 2019-08-10 | Disposition: A | Discharge status: Home / Self Care | Payer: Medicare Other | Source: Ambulatory Visit | Attending: Internal Medicine | Admitting: Internal Medicine

## 2019-08-10 DIAGNOSIS — Z01812 Encounter for preprocedural laboratory examination: Secondary | ICD-10-CM | POA: Diagnosis present

## 2019-08-10 DIAGNOSIS — Z20828 Contact with and (suspected) exposure to other viral communicable diseases: Secondary | ICD-10-CM | POA: Insufficient documentation

## 2019-08-10 NOTE — Telephone Encounter (Signed)
Message received from precert that the patient's cardiac cath for Monday had been denied from insurance.  Dr. Oval Linsey called insurance to have a peer to peer and it has now been approved. The patient verbalized his understanding.

## 2019-08-11 LAB — NOVEL CORONAVIRUS, NAA (HOSP ORDER, SEND-OUT TO REF LAB; TAT 18-24 HRS): SARS-CoV-2, NAA: NOT DETECTED

## 2019-08-13 ENCOUNTER — Other Ambulatory Visit: Payer: Self-pay

## 2019-08-13 ENCOUNTER — Ambulatory Visit (HOSPITAL_COMMUNITY)
Admission: RE | Admit: 2019-08-13 | Discharge: 2019-08-13 | Disposition: A | Discharge status: Home / Self Care | Payer: Medicare Other | Attending: Internal Medicine | Admitting: Internal Medicine

## 2019-08-13 ENCOUNTER — Encounter (HOSPITAL_COMMUNITY)
Admission: RE | Disposition: A | Discharge status: Home / Self Care | Payer: Self-pay | Source: Home / Self Care | Attending: Internal Medicine

## 2019-08-13 DIAGNOSIS — E039 Hypothyroidism, unspecified: Secondary | ICD-10-CM | POA: Diagnosis not present

## 2019-08-13 DIAGNOSIS — N400 Enlarged prostate without lower urinary tract symptoms: Secondary | ICD-10-CM | POA: Diagnosis not present

## 2019-08-13 DIAGNOSIS — I4819 Other persistent atrial fibrillation: Secondary | ICD-10-CM | POA: Insufficient documentation

## 2019-08-13 DIAGNOSIS — Z87891 Personal history of nicotine dependence: Secondary | ICD-10-CM | POA: Diagnosis not present

## 2019-08-13 DIAGNOSIS — Z7901 Long term (current) use of anticoagulants: Secondary | ICD-10-CM | POA: Diagnosis not present

## 2019-08-13 DIAGNOSIS — K219 Gastro-esophageal reflux disease without esophagitis: Secondary | ICD-10-CM | POA: Insufficient documentation

## 2019-08-13 DIAGNOSIS — E7800 Pure hypercholesterolemia, unspecified: Secondary | ICD-10-CM | POA: Insufficient documentation

## 2019-08-13 DIAGNOSIS — Z79899 Other long term (current) drug therapy: Secondary | ICD-10-CM | POA: Insufficient documentation

## 2019-08-13 DIAGNOSIS — Z79890 Hormone replacement therapy (postmenopausal): Secondary | ICD-10-CM | POA: Insufficient documentation

## 2019-08-13 DIAGNOSIS — C8330 Diffuse large B-cell lymphoma, unspecified site: Secondary | ICD-10-CM | POA: Insufficient documentation

## 2019-08-13 DIAGNOSIS — Z9221 Personal history of antineoplastic chemotherapy: Secondary | ICD-10-CM | POA: Insufficient documentation

## 2019-08-13 DIAGNOSIS — E785 Hyperlipidemia, unspecified: Secondary | ICD-10-CM | POA: Diagnosis not present

## 2019-08-13 DIAGNOSIS — F329 Major depressive disorder, single episode, unspecified: Secondary | ICD-10-CM | POA: Diagnosis not present

## 2019-08-13 DIAGNOSIS — I110 Hypertensive heart disease with heart failure: Secondary | ICD-10-CM | POA: Insufficient documentation

## 2019-08-13 DIAGNOSIS — G4730 Sleep apnea, unspecified: Secondary | ICD-10-CM | POA: Diagnosis not present

## 2019-08-13 DIAGNOSIS — I25119 Atherosclerotic heart disease of native coronary artery with unspecified angina pectoris: Secondary | ICD-10-CM | POA: Insufficient documentation

## 2019-08-13 DIAGNOSIS — Z6837 Body mass index (BMI) 37.0-37.9, adult: Secondary | ICD-10-CM | POA: Insufficient documentation

## 2019-08-13 DIAGNOSIS — I5032 Chronic diastolic (congestive) heart failure: Secondary | ICD-10-CM | POA: Diagnosis not present

## 2019-08-13 DIAGNOSIS — Z01812 Encounter for preprocedural laboratory examination: Secondary | ICD-10-CM

## 2019-08-13 DIAGNOSIS — I5022 Chronic systolic (congestive) heart failure: Secondary | ICD-10-CM

## 2019-08-13 DIAGNOSIS — I509 Heart failure, unspecified: Secondary | ICD-10-CM

## 2019-08-13 HISTORY — PX: INTRAVASCULAR PRESSURE WIRE/FFR STUDY: CATH118243

## 2019-08-13 HISTORY — PX: CORONARY PRESSURE/FFR STUDY: CATH118243

## 2019-08-13 HISTORY — PX: RIGHT/LEFT HEART CATH AND CORONARY ANGIOGRAPHY: CATH118266

## 2019-08-13 LAB — POCT I-STAT EG7
Acid-Base Excess: 2 mmol/L (ref 0.0–2.0)
Bicarbonate: 27.5 mmol/L (ref 20.0–28.0)
Calcium, Ion: 1.22 mmol/L (ref 1.15–1.40)
HCT: 39 % (ref 39.0–52.0)
Hemoglobin: 13.3 g/dL (ref 13.0–17.0)
O2 Saturation: 70 %
Potassium: 3.5 mmol/L (ref 3.5–5.1)
Sodium: 142 mmol/L (ref 135–145)
TCO2: 29 mmol/L (ref 22–32)
pCO2, Ven: 46.9 mmHg (ref 44.0–60.0)
pH, Ven: 7.375 (ref 7.250–7.430)
pO2, Ven: 38 mmHg (ref 32.0–45.0)

## 2019-08-13 LAB — POCT I-STAT 7, (LYTES, BLD GAS, ICA,H+H)
Acid-Base Excess: 5 mmol/L — ABNORMAL HIGH (ref 0.0–2.0)
Bicarbonate: 29.6 mmol/L — ABNORMAL HIGH (ref 20.0–28.0)
Calcium, Ion: 1.21 mmol/L (ref 1.15–1.40)
HCT: 40 % (ref 39.0–52.0)
Hemoglobin: 13.6 g/dL (ref 13.0–17.0)
O2 Saturation: 93 %
Potassium: 3.7 mmol/L (ref 3.5–5.1)
Sodium: 140 mmol/L (ref 135–145)
TCO2: 31 mmol/L (ref 22–32)
pCO2 arterial: 44 mmHg (ref 32.0–48.0)
pH, Arterial: 7.436 (ref 7.350–7.450)
pO2, Arterial: 66 mmHg — ABNORMAL LOW (ref 83.0–108.0)

## 2019-08-13 LAB — POCT ACTIVATED CLOTTING TIME: Activated Clotting Time: 230 seconds

## 2019-08-13 SURGERY — RIGHT/LEFT HEART CATH AND CORONARY ANGIOGRAPHY
Anesthesia: LOCAL

## 2019-08-13 MED ORDER — IOHEXOL 350 MG/ML SOLN
INTRAVENOUS | Status: DC | PRN
  Administered 2019-08-13: 110 mL via INTRA_ARTERIAL

## 2019-08-13 MED ORDER — SODIUM CHLORIDE 0.9% FLUSH: 3.0000 mL | INTRAVENOUS | Status: DC | PRN

## 2019-08-13 MED ORDER — FENTANYL CITRATE (PF) 100 MCG/2ML IJ SOLN
INTRAMUSCULAR | Status: AC
  Filled 2019-08-13: qty 2

## 2019-08-13 MED ORDER — ASPIRIN 81 MG PO CHEW: 81.0000 mg | CHEWABLE_TABLET | Freq: Every day | ORAL | Status: DC

## 2019-08-13 MED ORDER — NITROGLYCERIN 1 MG/10 ML FOR IR/CATH LAB
INTRA_ARTERIAL | Status: AC
  Filled 2019-08-13: qty 10

## 2019-08-13 MED ORDER — SODIUM CHLORIDE 0.9% FLUSH: 3.0000 mL | Freq: Two times a day (BID) | INTRAVENOUS | Status: DC

## 2019-08-13 MED ORDER — VERAPAMIL HCL 2.5 MG/ML IV SOLN
INTRAVENOUS | Status: AC
  Filled 2019-08-13: qty 2

## 2019-08-13 MED ORDER — HEPARIN (PORCINE) IN NACL 1000-0.9 UT/500ML-% IV SOLN
INTRAVENOUS | Status: AC
  Filled 2019-08-13: qty 1000

## 2019-08-13 MED ORDER — LIDOCAINE HCL (PF) 1 % IJ SOLN
INTRAMUSCULAR | Status: DC | PRN
  Administered 2019-08-13 (×2): 2 mL

## 2019-08-13 MED ORDER — RIVAROXABAN 10 MG PO TABS: 20.0000 mg | ORAL_TABLET | Freq: Every day | ORAL | Status: DC

## 2019-08-13 MED ORDER — FENTANYL CITRATE (PF) 100 MCG/2ML IJ SOLN
INTRAMUSCULAR | Status: DC | PRN
  Administered 2019-08-13: 25 ug via INTRAVENOUS

## 2019-08-13 MED ORDER — SODIUM CHLORIDE 0.9 % IV SOLN: 250.0000 mL | INTRAVENOUS | Status: DC | PRN

## 2019-08-13 MED ORDER — MIDAZOLAM HCL 2 MG/2ML IJ SOLN
INTRAMUSCULAR | Status: DC | PRN
  Administered 2019-08-13: 1 mg via INTRAVENOUS

## 2019-08-13 MED ORDER — SODIUM CHLORIDE 0.9 % IV SOLN: INTRAVENOUS | Status: DC

## 2019-08-13 MED ORDER — HEPARIN SODIUM (PORCINE) 1000 UNIT/ML IJ SOLN
INTRAMUSCULAR | Status: AC
  Filled 2019-08-13: qty 1

## 2019-08-13 MED ORDER — SODIUM CHLORIDE 0.9 % IV SOLN
INTRAVENOUS | Status: DC
  Administered 2019-08-13: 10:00:00 via INTRAVENOUS

## 2019-08-13 MED ORDER — HEPARIN (PORCINE) IN NACL 1000-0.9 UT/500ML-% IV SOLN
INTRAVENOUS | Status: DC | PRN
  Administered 2019-08-13 (×2): 500 mL

## 2019-08-13 MED ORDER — VERAPAMIL HCL 2.5 MG/ML IV SOLN
INTRAVENOUS | Status: DC | PRN
  Administered 2019-08-13: 10 mL via INTRA_ARTERIAL

## 2019-08-13 MED ORDER — LABETALOL HCL 5 MG/ML IV SOLN: 10.0000 mg | INTRAVENOUS | Status: DC | PRN

## 2019-08-13 MED ORDER — ACETAMINOPHEN 325 MG PO TABS: 650.0000 mg | ORAL_TABLET | ORAL | Status: DC | PRN

## 2019-08-13 MED ORDER — HEPARIN SODIUM (PORCINE) 1000 UNIT/ML IJ SOLN
INTRAMUSCULAR | Status: DC | PRN
  Administered 2019-08-13: 3000 [IU] via INTRAVENOUS
  Administered 2019-08-13: 7000 [IU] via INTRAVENOUS
  Administered 2019-08-13: 5000 [IU] via INTRAVENOUS

## 2019-08-13 MED ORDER — LIDOCAINE HCL (PF) 1 % IJ SOLN
INTRAMUSCULAR | Status: AC
  Filled 2019-08-13: qty 30

## 2019-08-13 MED ORDER — MIDAZOLAM HCL 2 MG/2ML IJ SOLN
INTRAMUSCULAR | Status: AC
  Filled 2019-08-13: qty 2

## 2019-08-13 MED ORDER — ASPIRIN 81 MG PO CHEW
81.0000 mg | CHEWABLE_TABLET | ORAL | Status: AC
  Administered 2019-08-13: 09:00:00 81 mg via ORAL
  Filled 2019-08-13: qty 1

## 2019-08-13 MED ORDER — ONDANSETRON HCL 4 MG/2ML IJ SOLN: 4.0000 mg | Freq: Four times a day (QID) | INTRAMUSCULAR | Status: DC | PRN

## 2019-08-13 MED ORDER — HYDRALAZINE HCL 20 MG/ML IJ SOLN: 10.0000 mg | INTRAMUSCULAR | Status: DC | PRN

## 2019-08-13 MED ORDER — NITROGLYCERIN 1 MG/10 ML FOR IR/CATH LAB
INTRA_ARTERIAL | Status: DC | PRN
  Administered 2019-08-13: 200 ug via INTRACORONARY

## 2019-08-13 SURGICAL SUPPLY — 17 items
CATH 5FR JL3.5 JR4 ANG PIG MP (CATHETERS) ×1 IMPLANT
CATH BALLN WEDGE 5F 110CM (CATHETERS) ×1 IMPLANT
CATH LAUNCHER 6FR EBU3.5 (CATHETERS) ×1 IMPLANT
DEVICE RAD COMP TR BAND LRG (VASCULAR PRODUCTS) ×1 IMPLANT
ELECT DEFIB PAD ADLT CADENCE (PAD) ×1 IMPLANT
GLIDESHEATH SLEND A-KIT 6F 22G (SHEATH) ×1 IMPLANT
GLIDESHEATH SLEND SS 6F .021 (SHEATH) ×1 IMPLANT
GUIDEWIRE INQWIRE 1.5J.035X260 (WIRE) IMPLANT
GUIDEWIRE PRESSURE COMET II (WIRE) ×1 IMPLANT
INQWIRE 1.5J .035X260CM (WIRE) ×2
KIT ESSENTIALS PG (KITS) ×1 IMPLANT
KIT HEART LEFT (KITS) ×2 IMPLANT
PACK CARDIAC CATHETERIZATION (CUSTOM PROCEDURE TRAY) ×2 IMPLANT
SHEATH GLIDE SLENDER 4/5FR (SHEATH) ×1 IMPLANT
TRANSDUCER W/STOPCOCK (MISCELLANEOUS) ×2 IMPLANT
TUBING CIL FLEX 10 FLL-RA (TUBING) ×2 IMPLANT
WIRE HI TORQ VERSACORE-J 145CM (WIRE) ×1 IMPLANT

## 2019-08-13 NOTE — Progress Notes (Signed)
T[facility] consulted for outpt CABG evaluation. [facility] office will call pt with an appointment.

## 2019-08-13 NOTE — Progress Notes (Signed)
Post procedure EKG not done in cath lab. Short Stay RN, Lisabeth Pick, called and informed of this and to do an EKG before patient discharge. RN verbally agreed to obtain post-procedure EKG before patient is discharged.

## 2019-08-13 NOTE — Brief Op Note (Addendum)
BRIEF CARDIAC CATHETERIZATION NOTE  08/13/2019  11:20 AM  PATIENT:  [name]  73 y.o. male  PRE-OPERATIVE DIAGNOSIS:  Angina and diastolic heart failure  POST-OPERATIVE DIAGNOSIS:  Same  PROCEDURE:  Procedure(s): RIGHT/LEFT HEART CATH AND CORONARY ANGIOGRAPHY (N/A) INTRAVASCULAR PRESSURE WIRE/FFR STUDY (N/A)  SURGEON:  Surgeon(s) and Role:    Nelva Bush, MD - Primary  FINDINGS: 1. Significant 2-vessel coronary artery disease, including 70% proximal LAD disease involving bifurcation with large D1 branch that his hemodynamically significant in both branches by DFR as well chronic total occlusion of the mid RCA. 2. Mildly elevated left and right heart filling pressures. 3. Normal Fick cardiac output/index.  RECOMMENDATIONS: 1. Outpatient cardiac surgery consultation based on anatomy of 2-vessel CAD involving LAD/D1 bifurcation.  If the patient is not a reasonable surgical candidate due to his comorbidities, I would favor medical therapy and complex PCI to proximal LAD/D1 for refractory symptoms. 2. Aggressive secondary prevention.  Nelva Bush, MD [facility] Pager: [phone]

## 2019-08-13 NOTE — Interval H&P Note (Signed)
History and Physical Interval Note:  08/13/2019 9:44 AM  [name]  has presented today for cardiac catheterization, with the diagnosis of angina.  The various methods of treatment have been discussed with the patient and family. After consideration of risks, benefits and other options for treatment, the patient has consented to  Procedure(s): RIGHT/LEFT HEART CATH AND CORONARY ANGIOGRAPHY (N/A) as a surgical intervention.  The patient's history has been reviewed, patient examined, no change in status, stable for surgery.  I have reviewed the patient's chart and labs.  Questions were answered to the patient's satisfaction.    Cath Lab Visit (complete for each Cath Lab visit)  Clinical Evaluation Leading to the Procedure:   ACS: No.  Non-ACS:    Anginal Classification: CCS III  Anti-ischemic medical therapy: Maximal Therapy (2 or more classes of medications)  Non-Invasive Test Results: No non-invasive testing performed  Prior CABG: No previous CABG  Ricky Doan

## 2019-08-13 NOTE — Progress Notes (Signed)
Discharge instructions reviewed with pt and his husband (via telephone) both voice understanding.

## 2019-08-13 NOTE — Discharge Instructions (Signed)
Radial Site Care ° °This sheet gives you information about how to care for yourself after your procedure. Your health care provider may also give you more specific instructions. If you have problems or questions, contact your health care provider. °What can I expect after the procedure? °After the procedure, it is common to have: °· Bruising and tenderness at the catheter insertion area. °Follow these instructions at home: °Medicines °· Take over-the-counter and prescription medicines only as told by your health care provider. °Insertion site care °· Follow instructions from your health care provider about how to take care of your insertion site. Make sure you: °? Wash your hands with soap and water before you change your bandage (dressing). If soap and water are not available, use hand sanitizer. °? Change your dressing as told by your health care provider. °? Leave stitches (sutures), skin glue, or adhesive strips in place. These skin closures may need to stay in place for 2 weeks or longer. If adhesive strip edges start to loosen and curl up, you may trim the loose edges. Do not remove adhesive strips completely unless your health care provider tells you to do that. °· Check your insertion site every day for signs of infection. Check for: °? Redness, swelling, or pain. °? Fluid or blood. °? Pus or a bad smell. °? Warmth. °· Do not take baths, swim, or use a hot tub until your health care provider approves. °· You may shower 24-48 hours after the procedure, or as directed by your health care provider. °? Remove the dressing and gently wash the site with plain soap and water. °? Pat the area dry with a clean towel. °? Do not rub the site. That could cause bleeding. °· Do not apply powder or lotion to the site. °Activity ° °· For 24 hours after the procedure, or as directed by your health care provider: °? Do not flex or bend the affected arm. °? Do not push or pull heavy objects with the affected arm. °? Do not  drive yourself home from the hospital or clinic. You may drive 24 hours after the procedure unless your health care provider tells you not to. °? Do not operate machinery or power tools. °· Do not lift anything that is heavier than 10 lb (4.5 kg), or the limit that you are told, until your health care provider says that it is safe. °· Ask your health care provider when it is okay to: °? Return to work or school. °? Resume usual physical activities or sports. °? Resume sexual activity. °General instructions °· If the catheter site starts to bleed, raise your arm and put firm pressure on the site. If the bleeding does not stop, get help right away. This is a medical emergency. °· If you went home on the same day as your procedure, a responsible adult should be with you for the first 24 hours after you arrive home. °· Keep all follow-up visits as told by your health care provider. This is important. °Contact a health care provider if: °· You have a fever. °· You have redness, swelling, or yellow drainage around your insertion site. °Get help right away if: °· You have unusual pain at the radial site. °· The catheter insertion area swells very fast. °· The insertion area is bleeding, and the bleeding does not stop when you hold steady pressure on the area. °· Your arm or hand becomes pale, cool, tingly, or numb. °These symptoms may represent a serious problem   that is an emergency. Do not wait to see if the symptoms will go away. Get medical help right away. Call your local emergency services (911 in the [place] Do not drive yourself to the hospital. °Summary °· After the procedure, it is common to have bruising and tenderness at the site. °· Follow instructions from your health care provider about how to take care of your radial site wound. Check the wound every day for signs of infection. °· Do not lift anything that is heavier than 10 lb (4.5 kg), or the limit that you are told, until your health care provider says  that it is safe. °This information is not intended to replace advice given to you by your health care provider. Make sure you discuss any questions you have with your health care provider. °Document Released: 10/30/2010 Document Revised: 11/02/2017 Document Reviewed: 11/02/2017 °Elsevier Patient Education © 2020 [employer] ° °

## 2019-08-14 ENCOUNTER — Encounter (HOSPITAL_COMMUNITY): Payer: Self-pay | Admitting: Internal Medicine

## 2019-08-14 NOTE — Telephone Encounter (Signed)
Approval number: [id number]

## 2019-08-15 ENCOUNTER — Other Ambulatory Visit: Payer: Self-pay

## 2019-08-15 ENCOUNTER — Institutional Professional Consult (permissible substitution) (INDEPENDENT_AMBULATORY_CARE_PROVIDER_SITE_OTHER): Payer: Medicare Other | Admitting: Cardiothoracic Surgery

## 2019-08-15 ENCOUNTER — Encounter: Payer: Self-pay | Admitting: Cardiothoracic Surgery

## 2019-08-15 VITALS — BP 133/75 | HR 96 | Temp 97.8°F | Resp 20 | Ht 75.0 in | Wt 298.0 lb

## 2019-08-15 DIAGNOSIS — I2510 Atherosclerotic heart disease of native coronary artery without angina pectoris: Secondary | ICD-10-CM

## 2019-08-15 NOTE — Progress Notes (Signed)
PCP is Plotnikov, Evie Lacks, MD Referring Provider is End, Harrell Gave, MD  Chief Complaint  Patient presents with  . Coronary Artery Disease    Surgical eval, Cardiac Cath 08/13/19, ECHO 06/28/19  Patient examined, images of coronary angiogram, echocardiogram, right heart cath data, and most recent PET scan Personally reviewed and counseled with patient and husband. HPI: 73 year old morbidly obese reformed smoker with sleep apnea and poor mobility and history of frequent falls recently diagnosed with two-vessel coronary disease.  His preceding symptoms were shortness of breath and reduced exercise tolerance.  His weight is over 300 pounds and has been stable.  He has normal LV systolic function but fairly significant LV diastolic dysfunction with elevated PA pressures and wedge.  He has evidence of chronic lung disease on his chest CT scan but has not had PFTs.  His room air oxygen saturation is 92-93% and drops minimally with exercise.  He denies any significant history of angina.  The patient earlier this year completed extensive chemotherapy for widespread B-cell lymphoma.  Last PET scan earlier this year shows residual tumor still in his head neck spine and lower extremities.  The patient is poorly mobile and requires a rolling walker and stays mainly at home.  Coronary anatomy demonstrates mild-moderate disease of the LAD diagonals and chronically occluded RCA reconstituted via collaterals.  RCA has been occluded probably for 20 years.  The patient is a poor candidate for surgical coronary revascularization.  I do not believe that surgical coronary revascularization would significantly impact his shortness of breath which is multifactorial--probably due to LV diastolic dysfunction, chronic A. fib, obesity, deconditioning, and underlying lung disease.  I would recommend continue medical therapy of his heart disease and optimizing diuretic therapy.  Past Medical History:  Diagnosis Date  .  A-fib (HCC)    hx.- sinus rhythm now  . Acid reflux   . AKI (acute kidney injury) (HCC)    mild  . Anxiety   . Arthritis   . Atrial dilatation, bilateral    Severly, enlargement  per ECHO 11/03/16  . BPH (benign prostatic hyperplasia)   . CAD (coronary artery disease)   . Cancer ([place])   . Depression   . Dislocation of internal right hip prosthesis, sequela   . Dysrhythmia    hx a fib  . Elevated brain natriuretic peptide (BNP) level   . Environmental allergies   . Fracture of wrist 10/23/13   LEFT  . High cholesterol   . History of Graves' disease   . History of kidney stones    surg. removal 02/2017  . History of syncope   . HLD (hyperlipidemia)   . Hypertension   . Hypothyroidism    thyroid removed  . Left knee injury    cap dislocated  . Low testosterone, possible hypogonadism 10/01/2012  . Lumbago   . Metabolic bone disease   . Multiple falls   . Osteoporosis    Severe  . Other abnormalities of gait and mobility   . Plaque psoriasis   . Sleep apnea    cpap machine-settings 17  . Thrombocytopenia ([place])   . Thyroid disease    HX GRAVES DISEASE  . Transfusion history    s/p 12'13 hip surgery  . Unsteady gait   . UTI (urinary tract infection)   . Vertebral compression fracture (HCC)    L1-wears brace    Past Surgical History:  Procedure Laterality Date  . APPENDECTOMY    . CARDIAC CATHETERIZATION  2013  .  CATARACT EXTRACTION, BILATERAL    . COLONOSCOPY    . CYSTOSCOPY WITH RETROGRADE PYELOGRAM, URETEROSCOPY AND STENT PLACEMENT Left 11/07/2017   Procedure: CYSTOSCOPY WITH RETROGRADE PYELOGRAM, URETEROSCOPY AND STENT PLACEMENT;  Surgeon: Cleon Gustin, MD;  Location: [facility];  Service: Urology;  Laterality: Left;  . CYSTOSCOPY/URETEROSCOPY/HOLMIUM LASER/STENT PLACEMENT Right 03/11/2017   Procedure: CYSTOSCOPY/URETEROSCOPYSTENT PLACEMENT right ureter retrograde pylegram;  Surgeon: Cleon Gustin, MD;  Location: [facility];  Service: Urology;  Laterality:  Right;  . HARDWARE REMOVAL  10/05/2012   Procedure: HARDWARE REMOVAL;  Surgeon: [name], MD;  Location: [facility];  Service: Orthopedics;  Laterality: Right;  REMOVING  STRYKER  GAMMA NAIL  . HARDWARE REMOVAL Right 07/03/2013   Procedure: HARDWARE REMOVAL RIGHT TIBIA ;  Surgeon: Rozanna Box, MD;  Location: [place];  Service: Orthopedics;  Laterality: Right;  . HIP CLOSED REDUCTION Right 10/15/2013   Procedure: CLOSED MANIPULATION HIP;  Surgeon: [name], MD;  [facility]: [facility];  Service: Orthopedics;  Laterality: Right;  . hip revision     June 2017  . HOLMIUM LASER APPLICATION Right [id number]   Procedure: HOLMIUM LASER APPLICATION;  Surgeon: Cleon Gustin, MD;  [facility];  Service: Urology;  Laterality: Right;  . HOLMIUM LASER APPLICATION Left [id number]   Procedure: HOLMIUM LASER APPLICATION;  Surgeon: Cleon Gustin, MD;  [facility]: [facility];  Service: Urology;  Laterality: Left;  . HOLMIUM LASER APPLICATION Left [id number]   Procedure: HOLMIUM LASER APPLICATION;  Surgeon: Cleon Gustin, MD;  [facility];  Service: Urology;  Laterality: Left;  . INCISION AND DRAINAGE HIP Right 11/16/2013   Procedure: IRRIGATION AND DEBRIDEMENT RIGHT HIP;  Surgeon: [name], MD;  [facility]: [facility];  Service: Orthopedics;  Laterality: Right;  . INTRAVASCULAR PRESSURE WIRE/FFR STUDY N/A 08/13/2019   Procedure: INTRAVASCULAR PRESSURE WIRE/FFR STUDY;  Surgeon: Nelva Bush, MD;  Location: [facility];  Service: Cardiovascular;  Laterality: N/A;  . IR IMAGING GUIDED PORT INSERTION  06/22/2018  . IR NEPHROSTOMY PLACEMENT LEFT  03/28/2019  . IR URETERAL STENT LEFT NEW ACCESS W/O SEP NEPHROSTOMY CATH  10/24/2017  . IR URETERAL STENT LEFT NEW ACCESS W/O SEP NEPHROSTOMY CATH  03/26/2019  . JOINT REPLACEMENT     hip-right x2  . KNEE SURGERY Bilateral 2012   Total knee replacements  . NEPHROLITHOTOMY Right 02/08/2017   Procedure: NEPHROLITHOTOMY PERCUTANEOUS WITH SURGEON ACCESS;   Surgeon: Cleon Gustin, MD;  [facility];  Service: Urology;  Laterality: Right;  . NEPHROLITHOTOMY Left 10/24/2017   Procedure: NEPHROLITHOTOMY PERCUTANEOUS;  Surgeon: Cleon Gustin, MD;  [facility];  Service: Urology;  Laterality: Left;  . NEPHROLITHOTOMY Left 03/26/2019   Procedure: NEPHROLITHOTOMY PERCUTANEOUS;  Surgeon: Cleon Gustin, MD;  [facility];  Service: Urology;  Laterality: Left;  2 HRS  . NEPHROLITHOTOMY Left 04/17/2019   Procedure: NEPHROLITHOTOMY PERCUTANEOUS;  Surgeon: Cleon Gustin, MD;  [facility];  Service: Urology;  Laterality: Left;  2 HRS  . ORIF TIBIA FRACTURE Right 02/06/2013   Procedure: OPEN REDUCTION INTERNAL FIXATION (ORIF) TIBIA FRACTURE WITH IM ROD FIBULA;  Surgeon: Rozanna Box, MD;  Location: [place];  Service: Orthopedics;  Laterality: Right;  . ORIF TIBIA FRACTURE Right 07/03/2013   Procedure: RIGHT TIBIA NON UNION REPAIR ;  Surgeon: Rozanna Box, MD;  Location: [place];  Service: Orthopedics;  Laterality: Right;  . ORIF WRIST FRACTURE  10/02/2012   Procedure: OPEN REDUCTION INTERNAL FIXATION (ORIF)  WRIST FRACTURE;  Surgeon: Roseanne Kaufman, MD;  [facility];  Service: Orthopedics;  Laterality: Right;  WITH   ANTIBIOTIC  CEMENT  . ORIF WRIST FRACTURE Left 10/28/2013   Procedure: OPEN REDUCTION INTERNAL FIXATION (ORIF) WRIST FRACTURE with allograft;  Surgeon: Roseanne Kaufman, MD;  [facility];  Service: Orthopedics;  Laterality: Left;  DVR Plate  . QUADRICEPS TENDON REPAIR Left 07/15/2017   Procedure: REPAIR QUADRICEP TENDON;  Surgeon: Frederik Pear, MD;  Location: [place];  Service: Orthopedics;  Laterality: Left;  . right femur surgery  05/2012  . RIGHT/LEFT HEART CATH AND CORONARY ANGIOGRAPHY N/A 08/13/2019   Procedure: RIGHT/LEFT HEART CATH AND CORONARY ANGIOGRAPHY;  Surgeon: Nelva Bush, MD;  Location: [facility];  Service: Cardiovascular;  Laterality: N/A;  . THYROIDECTOMY  02/1986  . TOTAL HIP  REVISION  10/05/2012   Procedure: TOTAL HIP REVISION;  Surgeon: [name], MD;  [facility];  Service: Orthopedics;  Laterality: Right;  RIGHT TOTAL HIP REVISION  . TOTAL HIP REVISION Right 09/17/2013   Procedure: REVISION RIGHT TOTAL HIP ARTHROPLASTY ;  Surgeon: [name], MD;  [facility];  Service: Orthopedics;  Laterality: Right;  . TOTAL HIP REVISION Right 10/26/2013   Procedure: REVISION RIGHT TOTAL HIP ARTHROPLASTY;  Surgeon: [name], MD;  [facility];  Service: Orthopedics;  Laterality: Right;  . TOTAL HIP REVISION  03/2016  . TOTAL KNEE REVISION Left 04/11/2017   Procedure: TOTAL KNEE REVISION PATELLA and TIBIA;  Surgeon: Frederik Pear, MD;  Location: [place];  Service: Orthopedics;  Laterality: Left;  . WRIST FRACTURE SURGERY  05/2012    Family History  Problem Relation Age of Onset  . CAD Father 72  . Asthma Father   . Alcohol abuse Father   . Arthritis Mother   . Alcohol abuse Sister     Social History Social History   Tobacco Use  . Smoking status: Former Smoker    Quit date: 06/05/2012    Years since quitting: 7.1  . Smokeless tobacco: Never Used  Substance Use Topics  . Alcohol use: Yes    Comment: occasional-social  . Drug use: No    Current Outpatient Medications  Medication Sig Dispense Refill  . acetaminophen (TYLENOL) 500 MG tablet Take 1,000 mg by mouth daily as needed for moderate pain.    [name] albuterol (PROVENTIL) (2.5 MG/3ML) 0.083% nebulizer solution Take 3 mLs (2.5 mg total) by nebulization every 4 (four) hours as needed for wheezing or shortness of breath. 75 mL 12  . alfuzosin (UROXATRAL) 10 MG 24 hr tablet Take 10 mg by mouth at bedtime.    [name] amoxicillin (AMOXIL) 500 MG capsule Take 2,000 mg by mouth See admin instructions. Take 4 capsules (2000 mg) by mouth 1 hour prior to dental work    . amoxicillin-clavulanate (AUGMENTIN) 500-125 MG tablet Take 1 tablet by mouth daily with supper.    [name] aspirin EC 81 MG tablet Take 81 mg  by mouth daily.    [name] atorvastatin (LIPITOR) 80 MG tablet TAKE 1 TABLET BY MOUTH  DAILY (Patient taking differently: Take 80 mg by mouth at bedtime. ) 90 tablet 0  . BELSOMRA 15 MG TABS TAKE 1 TABLET BY MOUTH  DAILY (Patient taking differently: Take 15 mg by mouth at bedtime as needed (sleep.). ) 90 tablet 1  . buPROPion (WELLBUTRIN XL) 300 MG 24 hr tablet TAKE 1 TABLET BY MOUTH  DAILY WITH BREAKFAST (Patient taking differently: Take 300 mg  by mouth daily before breakfast. ) 90 tablet 3  . Calcium Citrate-Vitamin D (CALCIUM CITRATE + D PO) Take 1 tablet by mouth 2 (two) times daily.     . Cholecalciferol (VITAMIN D-3 PO) Take 2,000 Units by mouth 2 (two) times daily.     . clonazePAM (KLONOPIN) 1 MG tablet TAKE 1 TABLET BY MOUTH  EVERY 6 HOURS AS NEEDED FOR ANXIETY (Patient taking differently: Take 1 mg by mouth every 6 (six) hours as needed for anxiety. ) 360 tablet 1  . denosumab (PROLIA) 60 MG/ML SOSY injection Inject 60 mg as directed every 6 (six) months. Last injection: Feb 2020 Next injection: August 2020    . diltiazem (CARDIZEM CD) 240 MG 24 hr capsule TAKE 1 CAPSULE BY MOUTH  DAILY (Patient taking differently: Take 240 mg by mouth daily. ) 90 capsule 3  . doxycycline (VIBRA-TABS) 100 MG tablet Take 1 tablet (100 mg total) by mouth daily. 90 tablet 3  . DULoxetine (CYMBALTA) 60 MG capsule TAKE 1 CAPSULE BY MOUTH  TWICE DAILY (Patient taking differently: Take 60 mg by mouth 2 (two) times daily. ) 180 capsule 3  . EPINEPHrine (EPIPEN 2-PAK) 0.3 mg/0.3 mL IJ SOAJ injection Inject 0.3 mg into the muscle daily as needed (for anaphylactic reaction).    . ezetimibe (ZETIA) 10 MG tablet TAKE 1 TABLET BY MOUTH  DAILY (Patient taking differently: Take 10 mg by mouth every evening. ) 90 tablet 3  . ferrous sulfate 325 (65 FE) MG tablet Take 325 mg by mouth 2 (two) times daily with a meal.    . hydrochlorothiazide (HYDRODIURIL) 25 MG tablet Take 1 tablet (25 mg total) by mouth daily. 90 tablet 3  .  HYDROcodone-acetaminophen (NORCO) 7.5-325 MG tablet Take 1 tablet by mouth 4 (four) times daily as needed for moderate pain. (Patient taking differently: Take 1 tablet by mouth every 6 (six) hours as needed for moderate pain. ) 120 tablet 0  . levothyroxine (SYNTHROID) 200 MCG tablet TAKE 1 TABLET BY MOUTH  DAILY AT 6 AM (Patient taking differently: Take 200 mcg by mouth daily before breakfast. ) 90 tablet 3  . levothyroxine (SYNTHROID) 25 MCG tablet TAKE 1 TABLET BY MOUTH  DAILY BEFORE BREAKFAST (Patient taking differently: Take 25 mcg by mouth daily before breakfast. ) 90 tablet 3  . loratadine (CLARITIN) 10 MG tablet Take 10 mg by mouth daily.     . Magnesium 500 MG CAPS Take 1 capsule (500 mg total) by mouth daily. (Patient taking differently: Take 500 mg by mouth every evening. ) 30 capsule 1  . metoprolol tartrate (LOPRESSOR) 25 MG tablet TAKE 1 TABLET BY MOUTH TWO  TIMES DAILY 180 tablet 2  . mirabegron ER (MYRBETRIQ) 25 MG TB24 tablet Take 25 mg by mouth at bedtime.     . Multiple Vitamin (MULTIVITAMIN WITH MINERALS) TABS tablet Take 1 tablet by mouth daily. Men's One-A-Day 3+    . nitroGLYCERIN (NITROSTAT) 0.4 MG SL tablet Place 0.4 mg under the tongue every 5 (five) minutes as needed for chest pain. x3 doses as needed for chest pain    . omeprazole (PRILOSEC) 40 MG capsule TAKE 1 CAPSULE BY MOUTH  DAILY BEFORE BREAKFAST (Patient taking differently: Take 40 mg by mouth daily before breakfast. ) 90 capsule 3  . OTEZLA 30 MG TABS Take 30 mg by mouth 2 (two) times a day.    . Probiotic Product (PROBIOTIC PO) Take 1 capsule by mouth daily with breakfast.    .  rivaroxaban (XARELTO) 10 MG TABS tablet Take 2 tablets (20 mg total) by mouth daily.    [name] testosterone enanthate (DELATESTRYL) 200 MG/ML injection INJECT 1ML (200MG ) INTO THE MUSCLE EVERY 14 DAYS. DISCARD VIAL AFTER 28 DAYS. (Patient taking differently: Inject 200 mg into the muscle every 14 (fourteen) days. INJECT 1ML (200MG ) INTO THE MUSCLE  EVERY 14 DAYS. DISCARD VIAL AFTER 28 DAYS.) 5 mL 3  . tiZANidine (ZANAFLEX) 4 MG tablet TAKE 1 TABLET BY MOUTH  EVERY 6 HOURS AS NEEDED FOR MUSCLE SPASM(S) (Patient taking differently: Take 4 mg by mouth every 6 (six) hours as needed (spasms.). ) 360 tablet 0  . vitamin C (ASCORBIC ACID) 500 MG tablet Take 500 mg by mouth daily.     Current Facility-Administered Medications  Medication Dose Route Frequency Provider Last Rate Last Dose  . sodium chloride flush (NS) 0.9 % injection 3 mL  3 mL Intravenous Q12H Skeet Latch, MD        Allergies  Allergen Reactions  . Short Ragweed Pollen Ext Cough    Review of Systems  Left-hand-dominant Sheltering in place for the Covid pandemic since he has had recent chemotherapy No history of stroke No active dental complaints or difficulty swallowing No history of DVT or pulmonary emboli No history of diabetes, dysuria or polyuria hematuria  BP 133/75   Pulse 96   Temp 97.8 F (36.6 C) (Skin)   Resp 20   Ht 6\' 3"  (1.905 m)   Wt 298 lb (135.2 kg)   SpO2 92% Comment: RA  BMI 37.25 kg/m  Physical Exam       Exam    General- alert and comfortable, no acute distress    Neck- no JVD, no cervical adenopathy palpable, no carotid bruit   Lungs- clear without rales, wheezes   Cor-irregular  rate and rhythm, no murmur , gallop   Abdomen- soft, non-tender   Extremities - warm, non-tender, 2+ ankle edema   Neuro- oriented, appropriate, no focal weakness Poor ambulation requires rolling walker Oxygen saturation at rest 92% drops to 91% with ambulation of 100 feet  Diagnostic Tests: Chronically occluded RCA with moderate LAD diagonal disease LV diastolic dysfunction Sleep apnea morbid obesity and deconditioning Diffuse B-cell lymphoma status post recent chemotherapy  Impression: Would not recommend surgical coronary revascularization as it would be at very high risk due to his comorbid medical problems and would probably not benefit his  symptoms. Plan recommend medical therapy of his diastolic dysfunction, deconditioning, A. fib and sleep apnea.  Recommend pulmonary consult for optimization of his pulmonary status.  Len Childs, MD [facility] [phone]

## 2019-08-18 ENCOUNTER — Other Ambulatory Visit: Payer: Self-pay | Admitting: Internal Medicine

## 2019-08-20 ENCOUNTER — Telehealth: Payer: Self-pay | Admitting: *Deleted

## 2019-08-20 NOTE — Telephone Encounter (Signed)
Left message to call back  

## 2019-08-20 NOTE — Telephone Encounter (Signed)
-----   Message from Skeet Latch, MD sent at 08/16/2019  5:03 PM EST ----- Will try to manage his CAD with medications.  Recommend adding Imdur 30mg  daily.  Keep follow up with Curt Bears later this month to see how he is doing.  If symptoms worsen before then let us know and we will need to see him sooner.  ----- Message ----- From: Ivin Poot, MD Sent: 08/15/2019  11:14 AM EST To: Nelva Bush, MD, Skeet Latch, MD, #

## 2019-08-21 MED ORDER — ISOSORBIDE MONONITRATE ER 30 MG PO TB24: 30.0000 mg | ORAL_TABLET | Freq: Every day | ORAL | 5 refills | Status: DC

## 2019-08-21 NOTE — Telephone Encounter (Signed)
[name], ok per [facility]

## 2019-08-21 NOTE — Telephone Encounter (Signed)
Follow up ° ° °Patient is returning call. Please call. °

## 2019-09-09 NOTE — Progress Notes (Signed)
Cardiology Office Note   Date:  09/10/2019   ID:  [name] March 22, 1946, MRN [id number]  PCP:  Cassandria Anger, MD  Cardiologist: Dr. Oval Linsey CC: Hospital Follow Up    History of Present Illness: [name] is a 73 y.o. male who presents for ongoing assessment and management of persistent atrial fibrillation, hypertension, hyperlipidemia, with other history to include OSA on CPAP, hypothyroidism, prior tobacco abuse, and history of diffuse large B cell lymphoma diagnosed in 2019 requiring 6 cycles of chemotherapy with rituximab, cyclophosphamide, doxorubicin, vincristine, and prednisone..  Most recent cardiac catheterization in 2012 revealed a 90% obstruction of an unknown vessel that had developed collaterals.  He was placed on Ranexa at that time.  Echocardiogram in January 2018 revealed an LVEF of 60% to 65%, with severe left and right atrial enlargement with IVC dilatation.  The patient was asymptomatic and Ranexa was discontinued.  On last office visit with Dr. Oval Linsey on 08/06/2019 the patient was complaining of epigastric pain that occurred with exertion and associated with exertional dyspnea.  He was planned for a right and left heart cardiac catheterization.  This was completed by Dr. Harrell Gave End on 08/13/2019:  He recommended outpatient cardiac surgery evaluation, given significant two vessel coronary artery disease including proximal LAD involvement and CTO of the RCA.  If comorbidities pose an excessively high perioperative risk, I would favor maximizing medical therapy.  If the patient has refractory symptoms, PCI to the proximal/mid LAD and D2 could be considered.  He was seen by CVTS, Dr. Nils Pyle on 08/15/2019 and was deemed a poor candidate for surgical coronary revascularization, finding that coronary revascularization would not significantly impact his shortness of breath which was felt to be multifactorial in the setting of LV diastolic dysfunction,  chronic atrial fibrillation, obesity, deconditioning, and underlying lung disease.  He was recommended for continued medical therapy of his heart disease optimizing diuretic therapy.  On review of these recommendations from CVTS, Dr. Oval Linsey began Imdur 30 mg daily.  He is here for follow-up to discuss his symptoms.  [name] comes today without any significant complaints.  He speaks at length about his past medical history.  He denies any recurrent discomfort in his chest with use of isosorbide.  He denies any dizziness, PND, or dyspnea on exertion.  He continues to use a walker for ambulation.  He feels very deconditioned.  He has not been able to go to the [facility] to swim or workout due to COVID restrictions, as well as being a high risk patient.  He has been medically compliant.  He has recently had to change insurance companies and now is on the [employer] and requires new prescriptions to be sent to allow him refill at 90 days supply at a time.  Past Medical History:  Diagnosis Date  . A-fib (HCC)    hx.- sinus rhythm now  . Acid reflux   . AKI (acute kidney injury) (HCC)    mild  . Anxiety   . Arthritis   . Atrial dilatation, bilateral    Severly, enlargement  per ECHO 11/03/16  . BPH (benign prostatic hyperplasia)   . CAD (coronary artery disease)   . Cancer ([place])   . Depression   . Dislocation of internal right hip prosthesis, sequela   . Dysrhythmia    hx a fib  . Elevated brain natriuretic peptide (BNP) level   . Environmental allergies   . Fracture of wrist 10/23/13   LEFT  .  High cholesterol   . History of Graves' disease   . History of kidney stones    surg. removal 02/2017  . History of syncope   . HLD (hyperlipidemia)   . Hypertension   . Hypothyroidism    thyroid removed  . Left knee injury    cap dislocated  . Low testosterone, possible hypogonadism 10/01/2012  . Lumbago   . Metabolic bone disease   . Multiple falls   . Osteoporosis    Severe  . Other  abnormalities of gait and mobility   . Plaque psoriasis   . Sleep apnea    cpap machine-settings 17  . Thrombocytopenia ([place])   . Thyroid disease    HX GRAVES DISEASE  . Transfusion history    s/p 12'13 hip surgery  . Unsteady gait   . UTI (urinary tract infection)   . Vertebral compression fracture (HCC)    L1-wears brace    Past Surgical History:  Procedure Laterality Date  . APPENDECTOMY    . CARDIAC CATHETERIZATION  2013  . CATARACT EXTRACTION, BILATERAL    . COLONOSCOPY    . CYSTOSCOPY WITH RETROGRADE PYELOGRAM, URETEROSCOPY AND STENT PLACEMENT Left 11/07/2017   Procedure: CYSTOSCOPY WITH RETROGRADE PYELOGRAM, URETEROSCOPY AND STENT PLACEMENT;  Surgeon: Cleon Gustin, MD;  Location: [facility];  Service: Urology;  Laterality: Left;  . CYSTOSCOPY/URETEROSCOPY/HOLMIUM LASER/STENT PLACEMENT Right 03/11/2017   Procedure: CYSTOSCOPY/URETEROSCOPYSTENT PLACEMENT right ureter retrograde pylegram;  Surgeon: Cleon Gustin, MD;  [facility]: [facility];  Service: Urology;  Laterality: Right;  . HARDWARE REMOVAL  10/05/2012   Procedure: HARDWARE REMOVAL;  Surgeon: [name], MD;  [facility];  Service: Orthopedics;  Laterality: Right;  REMOVING  STRYKER  GAMMA NAIL  . HARDWARE REMOVAL Right 07/03/2013   Procedure: HARDWARE REMOVAL RIGHT TIBIA ;  Surgeon: Rozanna Box, MD;  Location: [place];  Service: Orthopedics;  Laterality: Right;  . HIP CLOSED REDUCTION Right 10/15/2013   Procedure: CLOSED MANIPULATION HIP;  Surgeon: [name], MD;  [facility];  Service: Orthopedics;  Laterality: Right;  . hip revision     June 2017  . HOLMIUM LASER APPLICATION Right [id number]   Procedure: HOLMIUM LASER APPLICATION;  Surgeon: Cleon Gustin, MD;  [facility];  Service: Urology;  Laterality: Right;  . HOLMIUM LASER APPLICATION Left [id number]   Procedure: HOLMIUM LASER APPLICATION;  Surgeon: Cleon Gustin, MD;  [facility];  Service: Urology;  Laterality: Left;  .  HOLMIUM LASER APPLICATION Left [id number]   Procedure: HOLMIUM LASER APPLICATION;  Surgeon: Cleon Gustin, MD;  [facility];  Service: Urology;  Laterality: Left;  . INCISION AND DRAINAGE HIP Right 11/16/2013   Procedure: IRRIGATION AND DEBRIDEMENT RIGHT HIP;  Surgeon: [name], MD;  [facility];  Service: Orthopedics;  Laterality: Right;  . INTRAVASCULAR PRESSURE WIRE/FFR STUDY N/A 08/13/2019   Procedure: INTRAVASCULAR PRESSURE WIRE/FFR STUDY;  Surgeon: Nelva Bush, MD;  Location: [place] [facility];  Service: Cardiovascular;  Laterality: N/A;  . IR IMAGING GUIDED PORT INSERTION  06/22/2018  . IR NEPHROSTOMY PLACEMENT LEFT  03/28/2019  . IR URETERAL STENT LEFT NEW ACCESS W/O SEP NEPHROSTOMY CATH  10/24/2017  . IR URETERAL STENT LEFT NEW ACCESS W/O SEP NEPHROSTOMY CATH  03/26/2019  . JOINT REPLACEMENT     hip-right x2  . KNEE SURGERY Bilateral 2012   Total knee replacements  . NEPHROLITHOTOMY Right 02/08/2017   Procedure: NEPHROLITHOTOMY PERCUTANEOUS WITH SURGEON ACCESS;  Surgeon: Candee Furbish  McKenzie, MD;  [facility];  Service: Urology;  Laterality: Right;  . NEPHROLITHOTOMY Left 10/24/2017   Procedure: NEPHROLITHOTOMY PERCUTANEOUS;  Surgeon: Cleon Gustin, MD;  [facility];  Service: Urology;  Laterality: Left;  . NEPHROLITHOTOMY Left 03/26/2019   Procedure: NEPHROLITHOTOMY PERCUTANEOUS;  Surgeon: Cleon Gustin, MD;  [facility];  Service: Urology;  Laterality: Left;  2 HRS  . NEPHROLITHOTOMY Left 04/17/2019   Procedure: NEPHROLITHOTOMY PERCUTANEOUS;  Surgeon: Cleon Gustin, MD;  [facility];  Service: Urology;  Laterality: Left;  2 HRS  . ORIF TIBIA FRACTURE Right 02/06/2013   Procedure: OPEN REDUCTION INTERNAL FIXATION (ORIF) TIBIA FRACTURE WITH IM ROD FIBULA;  Surgeon: Rozanna Box, MD;  Location: [place];  Service: Orthopedics;  Laterality: Right;  . ORIF TIBIA FRACTURE Right 07/03/2013   Procedure: RIGHT TIBIA NON UNION REPAIR ;   Surgeon: Rozanna Box, MD;  Location: [place];  Service: Orthopedics;  Laterality: Right;  . ORIF WRIST FRACTURE  10/02/2012   Procedure: OPEN REDUCTION INTERNAL FIXATION (ORIF) WRIST FRACTURE;  Surgeon: Roseanne Kaufman, MD;  [facility];  Service: Orthopedics;  Laterality: Right;  WITH   ANTIBIOTIC  CEMENT  . ORIF WRIST FRACTURE Left 10/28/2013   Procedure: OPEN REDUCTION INTERNAL FIXATION (ORIF) WRIST FRACTURE with allograft;  Surgeon: Roseanne Kaufman, MD;  [facility];  Service: Orthopedics;  Laterality: Left;  DVR Plate  . QUADRICEPS TENDON REPAIR Left 07/15/2017   Procedure: REPAIR QUADRICEP TENDON;  Surgeon: Frederik Pear, MD;  Location: [place];  Service: Orthopedics;  Laterality: Left;  . right femur surgery  05/2012  . RIGHT/LEFT HEART CATH AND CORONARY ANGIOGRAPHY N/A 08/13/2019   Procedure: RIGHT/LEFT HEART CATH AND CORONARY ANGIOGRAPHY;  Surgeon: Nelva Bush, MD;  Location: [place] [facility];  Service: Cardiovascular;  Laterality: N/A;  . THYROIDECTOMY  02/1986  . TOTAL HIP REVISION  10/05/2012   Procedure: TOTAL HIP REVISION;  Surgeon: [name], MD;  [facility];  Service: Orthopedics;  Laterality: Right;  RIGHT TOTAL HIP REVISION  . TOTAL HIP REVISION Right 09/17/2013   Procedure: REVISION RIGHT TOTAL HIP ARTHROPLASTY ;  Surgeon: [name], MD;  [facility];  Service: Orthopedics;  Laterality: Right;  . TOTAL HIP REVISION Right 10/26/2013   Procedure: REVISION RIGHT TOTAL HIP ARTHROPLASTY;  Surgeon: [name], MD;  [facility];  Service: Orthopedics;  Laterality: Right;  . TOTAL HIP REVISION  03/2016  . TOTAL KNEE REVISION Left 04/11/2017   Procedure: TOTAL KNEE REVISION PATELLA and TIBIA;  Surgeon: Frederik Pear, MD;  Location: [place];  Service: Orthopedics;  Laterality: Left;  . WRIST FRACTURE SURGERY  05/2012     Current Outpatient Medications  Medication Sig Dispense Refill  . acetaminophen (TYLENOL) 500 MG tablet Take 1,000 mg by mouth  daily as needed for moderate pain.    [name] albuterol (PROVENTIL) (2.5 MG/3ML) 0.083% nebulizer solution Take 3 mLs (2.5 mg total) by nebulization every 4 (four) hours as needed for wheezing or shortness of breath. 75 mL 12  . alfuzosin (UROXATRAL) 10 MG 24 hr tablet Take 10 mg by mouth at bedtime.    [name] amoxicillin (AMOXIL) 500 MG capsule Take 2,000 mg by mouth See admin instructions. Take 4 capsules (2000 mg) by mouth 1 hour prior to dental work    . amoxicillin-clavulanate (AUGMENTIN) 500-125 MG tablet Take 1 tablet by mouth daily with supper.    [name] aspirin EC 81 MG tablet Take 81 mg  by mouth daily.    [name] atorvastatin (LIPITOR) 80 MG tablet TAKE 1 TABLET BY MOUTH  DAILY (Patient taking differently: Take 80 mg by mouth at bedtime. ) 90 tablet 0  . BELSOMRA 15 MG TABS TAKE 1 TABLET BY MOUTH  DAILY (Patient taking differently: Take 15 mg by mouth at bedtime as needed (sleep.). ) 90 tablet 1  . buPROPion (WELLBUTRIN XL) 300 MG 24 hr tablet TAKE 1 TABLET BY MOUTH  DAILY WITH BREAKFAST (Patient taking differently: Take 300 mg by mouth daily before breakfast. ) 90 tablet 3  . Calcium Citrate-Vitamin D (CALCIUM CITRATE + D PO) Take 1 tablet by mouth 2 (two) times daily.     . Cholecalciferol (VITAMIN D-3 PO) Take 2,000 Units by mouth 2 (two) times daily.     . clonazePAM (KLONOPIN) 1 MG tablet TAKE 1 TABLET BY MOUTH  EVERY 6 HOURS AS NEEDED FOR ANXIETY 360 tablet 1  . denosumab (PROLIA) 60 MG/ML SOSY injection Inject 60 mg as directed every 6 (six) months. Last injection: Feb 2020 Next injection: August 2020    . diltiazem (CARDIZEM CD) 240 MG 24 hr capsule TAKE 1 CAPSULE BY MOUTH  DAILY (Patient taking differently: Take 240 mg by mouth daily. ) 90 capsule 3  . DULoxetine (CYMBALTA) 60 MG capsule TAKE 1 CAPSULE BY MOUTH  TWICE DAILY (Patient taking differently: Take 60 mg by mouth 2 (two) times daily. ) 180 capsule 3  . EPINEPHrine (EPIPEN 2-PAK) 0.3 mg/0.3 mL IJ SOAJ injection Inject 0.3 mg into the muscle daily  as needed (for anaphylactic reaction).    . ezetimibe (ZETIA) 10 MG tablet TAKE 1 TABLET BY MOUTH  DAILY (Patient taking differently: Take 10 mg by mouth every evening. ) 90 tablet 3  . ferrous sulfate 325 (65 FE) MG tablet Take 325 mg by mouth 2 (two) times daily with a meal.    . hydrochlorothiazide (HYDRODIURIL) 25 MG tablet Take 1 tablet (25 mg total) by mouth daily. 90 tablet 3  . HYDROcodone-acetaminophen (NORCO) 7.5-325 MG tablet Take 1 tablet by mouth 4 (four) times daily as needed for moderate pain. (Patient taking differently: Take 1 tablet by mouth every 6 (six) hours as needed for moderate pain. ) 120 tablet 0  . isosorbide mononitrate (IMDUR) 30 MG 24 hr tablet Take 1 tablet (30 mg total) by mouth daily. 90 tablet 3  . levothyroxine (SYNTHROID) 200 MCG tablet TAKE 1 TABLET BY MOUTH  DAILY AT 6 AM (Patient taking differently: Take 200 mcg by mouth daily before breakfast. ) 90 tablet 3  . levothyroxine (SYNTHROID) 25 MCG tablet TAKE 1 TABLET BY MOUTH  DAILY BEFORE BREAKFAST (Patient taking differently: Take 25 mcg by mouth daily before breakfast. ) 90 tablet 3  . loratadine (CLARITIN) 10 MG tablet Take 10 mg by mouth daily.     . Magnesium 500 MG CAPS Take 1 capsule (500 mg total) by mouth daily. (Patient taking differently: Take 500 mg by mouth every evening. ) 30 capsule 1  . metoprolol tartrate (LOPRESSOR) 25 MG tablet TAKE 1 TABLET BY MOUTH TWO  TIMES DAILY 180 tablet 2  . mirabegron ER (MYRBETRIQ) 25 MG TB24 tablet Take 25 mg by mouth at bedtime.     . Multiple Vitamin (MULTIVITAMIN WITH MINERALS) TABS tablet Take 1 tablet by mouth daily. Men's One-A-Day 64+    . nitroGLYCERIN (NITROSTAT) 0.4 MG SL tablet Place 0.4 mg under the tongue every 5 (five) minutes as needed for chest pain. x3 doses  as needed for chest pain    . omeprazole (PRILOSEC) 40 MG capsule TAKE 1 CAPSULE BY MOUTH  DAILY BEFORE BREAKFAST (Patient taking differently: Take 40 mg by mouth daily before breakfast. ) 90  capsule 3  . OTEZLA 30 MG TABS Take 30 mg by mouth 2 (two) times a day.    . Probiotic Product (PROBIOTIC PO) Take 1 capsule by mouth daily with breakfast.    . rivaroxaban (XARELTO) 10 MG TABS tablet Take 2 tablets (20 mg total) by mouth daily.    [name] testosterone enanthate (DELATESTRYL) 200 MG/ML injection INJECT 1ML (200MG ) INTO THE MUSCLE EVERY 14 DAYS. DISCARD VIAL AFTER 28 DAYS. (Patient taking differently: Inject 200 mg into the muscle every 14 (fourteen) days. INJECT 1ML (200MG ) INTO THE MUSCLE EVERY 14 DAYS. DISCARD VIAL AFTER 28 DAYS.) 5 mL 3  . tiZANidine (ZANAFLEX) 4 MG tablet TAKE 1 TABLET BY MOUTH  EVERY 6 HOURS AS NEEDED FOR MUSCLE SPASM(S) (Patient taking differently: Take 4 mg by mouth every 6 (six) hours as needed (spasms.). ) 360 tablet 0  . vitamin C (ASCORBIC ACID) 500 MG tablet Take 500 mg by mouth daily.    [name] doxycycline (VIBRA-TABS) 100 MG tablet Take 1 tablet (100 mg total) by mouth daily. 90 tablet 3   Current Facility-Administered Medications  Medication Dose Route Frequency Provider Last Rate Last Dose  . sodium chloride flush (NS) 0.9 % injection 3 mL  3 mL Intravenous Q12H Skeet Latch, MD        Allergies:   Short ragweed pollen ext    Social History:  The patient  reports that he quit smoking about 7 years ago. He has never used smokeless tobacco. He reports current alcohol use. He reports that he does not use drugs.   Family History:  The patient's family history includes Alcohol abuse in his father and sister; Arthritis in his mother; Asthma in his father; CAD (age of onset: 33) in his father.    ROS: All other systems are reviewed and negative. Unless otherwise mentioned in H&P    PHYSICAL EXAM: VS:  BP (!) 150/74   Pulse (!) 59   Temp (!) 97 F (36.1 C)   Ht 6\' 3"  (1.905 m)   Wt 290 lb 9.6 oz (131.8 kg)   SpO2 95%   BMI 36.32 kg/m  , BMI Body mass index is 36.32 kg/m. GEN: Well nourished, well developed, in no acute distress HEENT: normal  Neck: no JVD, carotid bruits, or masses Cardiac: RRR; no murmurs, rubs, or gallops,no edema  Respiratory:  Clear to auscultation bilaterally, normal work of breathing GI: soft, nontender, nondistended, + BS MS: no deformity or atrophy Skin: warm and dry, no rash Neuro:  Strength and sensation are intact Psych: euthymic mood, full affect   EKG: Not completed during this office visit  Recent Labs: 10/12/2018: Magnesium 1.6 11/09/2018: TSH 0.130 06/15/2019: B Natriuretic Peptide 283.8 08/03/2019: ALT 20 08/09/2019: BUN 18; Creatinine, Ser 1.12; Platelets 141 08/13/2019: Hemoglobin 13.6; Potassium 3.7; Sodium 140    Lipid Panel    Component Value Date/Time   CHOL 111 10/12/2018 0919   TRIG 64 10/12/2018 0919   HDL 56 10/12/2018 0919   CHOLHDL 2 10/02/2018 1114   VLDL 16.8 10/02/2018 1114   LDLCALC 42 10/12/2018 0919      Wt Readings from Last 3 Encounters:  09/10/19 290 lb 9.6 oz (131.8 kg)  08/15/19 298 lb (135.2 kg)  08/13/19 298 lb (135.2 kg)  Other studies Reviewed: Right and Left Cardiac Catheterization 08/13/2019  1. Significant two-vessel coronary artery disease, including sequential 60% proximal and 70% mid LAD stenosis, which are hemodynamically significant by DFR and also involve large D2 branch, as well as chronic total occlusion of the mid RCA with bridging and left-to-right collaterals. 2. Mildly elevated left heart and pulmonary artery pressures.  Prominent v-waves noted on PCWP; question if mitral regurgitation is more severe than appreciated on recent transthoracic echocardiogram. 3. Moderately elevated right heart filling pressures. 4. Normal Fick cardiac output/index.  Recommendations: 1. Outpatient cardiac surgery evaluation, given significant two vessel coronary artery disease including proximal LAD involvement and CTO of the RCA.  If comorbidities pose an excessively high perioperative risk, I would favor maximizing medical therapy.  If the patient has  refractory symptoms, PCI to the proximal/mid LAD and D2 could be considered.  I would defer PCI to CTO of the RCA, as this has reportedly been present for 20+ years. 2. Consider gentle diuresis and continued medical management of chronic HFpEF and persistent atrial fibrillation. 3. Aggressive secondary prevention.  ASSESSMENT AND PLAN:  1. CAD: Lengthy history as above.  He continues on medical therapy at this time with statin, metoprolol, isosorbide.  He is completely asymptomatic at this time.  He admits to not being very active, as he normally likes to go to the [facility] and swim.  Now he is walking outside for several feet, trying to work back up to 30 minutes a day.  There is no discomfort with exertion.  For now continue current medication regimen.  He is to report any new symptoms of shortness of breath chest pressure or profound fatigue.  If this does occur, may need to revisit intervention as discussed above.  2.  Paroxysmal atrial fibrillation: He remains on rivaroxaban 20 mg daily, heart rate is well controlled currently on metoprolol and diltiazem.  He offers no complaints of bleeding issues.  3.  Hypertension: Blood pressure slightly elevated today.  I have rechecked it in the office and found it to be 142/68.  Although not optimal for patient with CAD he does have significant anxiety about coming to a provider's office.  May need to adjust medications for optimal blood pressure control of 130/70 on follow-up should this be necessary.  Continue HCTZ, isosorbide, diltiazem, and metoprolol.  4.  Hypercholesterolemia: Remains on atorvastatin 80 mg daily.  He has not had lipids and LFTs drawn since January 2020.  I have ordered these.  He may be seeing his PCP this month which will allow him to have these drawn on that visit.  If not,  he will have them drawn at least by January 2020.   Current medicines are reviewed at length with the patient today.    Labs/ tests ordered today include:  Fasting lipids and LFTs (can be completed through PCP on December 10 appointment).  Phill Myron. West Pugh, ANP, AACC   09/10/2019 3:59 PM    [facility] [place] [address] Office [phone] Fax [phone]  Notice: This dictation was prepared with Dragon dictation along with smaller phrase technology. Any transcriptional errors that result from this process are unintentional and may not be corrected upon review.

## 2019-09-10 ENCOUNTER — Encounter: Payer: Self-pay | Admitting: Adult Health

## 2019-09-10 ENCOUNTER — Ambulatory Visit: Payer: Medicare Other | Admitting: Adult Health

## 2019-09-10 ENCOUNTER — Other Ambulatory Visit: Payer: Self-pay

## 2019-09-10 VITALS — BP 150/74 | HR 59 | Temp 97.0°F | Ht 75.0 in | Wt 290.6 lb

## 2019-09-10 DIAGNOSIS — I2510 Atherosclerotic heart disease of native coronary artery without angina pectoris: Secondary | ICD-10-CM | POA: Diagnosis not present

## 2019-09-10 DIAGNOSIS — Z79899 Other long term (current) drug therapy: Secondary | ICD-10-CM

## 2019-09-10 DIAGNOSIS — I10 Essential (primary) hypertension: Secondary | ICD-10-CM | POA: Diagnosis not present

## 2019-09-10 DIAGNOSIS — E785 Hyperlipidemia, unspecified: Secondary | ICD-10-CM

## 2019-09-10 DIAGNOSIS — I480 Paroxysmal atrial fibrillation: Secondary | ICD-10-CM

## 2019-09-10 DIAGNOSIS — E7800 Pure hypercholesterolemia, unspecified: Secondary | ICD-10-CM

## 2019-09-10 MED ORDER — ISOSORBIDE MONONITRATE ER 30 MG PO TB24: 30.0000 mg | ORAL_TABLET | Freq: Every day | ORAL | 3 refills | Status: DC

## 2019-09-10 MED ORDER — HYDROCHLOROTHIAZIDE 25 MG PO TABS: 25.0000 mg | ORAL_TABLET | Freq: Every day | ORAL | 3 refills | Status: DC

## 2019-09-10 NOTE — Patient Instructions (Signed)
Medication Instructions:  Joseph Sims, DNP recommends that you continue on your current medications as directed. Please refer to the Current Medication list given to you today.  *If you need a refill on your cardiac medications before your next appointment, please call your pharmacy*  Lab Work: Your physician recommends that you return for [name] lab work in JANUARY 2021.  If you have labs (blood work) drawn today and your tests are completely normal, you will receive your results only by: [name] MyChart Message (if you have MyChart) OR . A paper copy in the mail If you have any lab test that is abnormal or we need to change your treatment, we will call you to review the results.  Follow-Up: At [facility], you and your health needs are our priority.  As part of our continuing mission to provide you with exceptional heart care, we have created designated Provider Care Teams.  These Care Teams include your primary Cardiologist (physician) and Advanced Practice Providers (APPs -  Physician Assistants and Nurse Practitioners) who all work together to provide you with the care you need, when you need it.  Your next appointment:   3 month(s)  The format for your next appointment:   Either In Person or Virtual  Provider:   You may see Skeet Latch, MD or one of the following Advanced Practice Providers on your designated Care Team:    Kerin Ransom, PA-C  [place], [place]  Coletta Memos, [place]

## 2019-09-12 ENCOUNTER — Other Ambulatory Visit: Payer: Self-pay | Admitting: Cardiovascular Disease

## 2019-09-15 ENCOUNTER — Other Ambulatory Visit: Payer: Self-pay | Admitting: Internal Medicine

## 2019-09-17 ENCOUNTER — Other Ambulatory Visit: Payer: Self-pay | Admitting: Internal Medicine

## 2019-09-17 NOTE — Telephone Encounter (Signed)
[place] Controlled Database Checked Last filled: 07/30/19 # 90 LOV w/you: 05/30/19 Next appt w/you: 09/20/19

## 2019-09-18 ENCOUNTER — Other Ambulatory Visit: Payer: Self-pay | Admitting: Urology

## 2019-09-20 ENCOUNTER — Ambulatory Visit (INDEPENDENT_AMBULATORY_CARE_PROVIDER_SITE_OTHER): Payer: Medicare Other | Admitting: Internal Medicine

## 2019-09-20 ENCOUNTER — Other Ambulatory Visit: Payer: Self-pay

## 2019-09-20 ENCOUNTER — Encounter: Payer: Self-pay | Admitting: Internal Medicine

## 2019-09-20 DIAGNOSIS — M1990 Unspecified osteoarthritis, unspecified site: Secondary | ICD-10-CM | POA: Diagnosis not present

## 2019-09-20 DIAGNOSIS — E559 Vitamin D deficiency, unspecified: Secondary | ICD-10-CM

## 2019-09-20 DIAGNOSIS — I5032 Chronic diastolic (congestive) heart failure: Secondary | ICD-10-CM

## 2019-09-20 DIAGNOSIS — I10 Essential (primary) hypertension: Secondary | ICD-10-CM

## 2019-09-20 DIAGNOSIS — M79605 Pain in left leg: Secondary | ICD-10-CM

## 2019-09-20 DIAGNOSIS — M545 Low back pain: Secondary | ICD-10-CM

## 2019-09-20 MED ORDER — HYDROCODONE-ACETAMINOPHEN 7.5-325 MG PO TABS: 1.0000 | ORAL_TABLET | Freq: Four times a day (QID) | ORAL | 0 refills | Status: DC | PRN

## 2019-09-20 NOTE — Progress Notes (Signed)
Subjective:  Patient ID: [name], male    DOB: 24-Nov-1945  Age: 73 y.o. MRN: [id number]  CC: No chief complaint on file.   HPI [name] presents for chronic pain, anxiety, OAB, CHF f/u  Outpatient Medications Prior to Visit  Medication Sig Dispense Refill  . acetaminophen (TYLENOL) 500 MG tablet Take 1,000 mg by mouth daily as needed for moderate pain.    [name] albuterol (PROVENTIL) (2.5 MG/3ML) 0.083% nebulizer solution Take 3 mLs (2.5 mg total) by nebulization every 4 (four) hours as needed for wheezing or shortness of breath. 75 mL 12  . alfuzosin (UROXATRAL) 10 MG 24 hr tablet Take 10 mg by mouth at bedtime.    [name] amoxicillin (AMOXIL) 500 MG capsule Take 2,000 mg by mouth See admin instructions. Take 4 capsules (2000 mg) by mouth 1 hour prior to dental work    . amoxicillin-clavulanate (AUGMENTIN) 500-125 MG tablet Take 1 tablet by mouth daily with supper.    [name] aspirin EC 81 MG tablet Take 81 mg by mouth daily.    [name] atorvastatin (LIPITOR) 80 MG tablet TAKE 1 TABLET BY MOUTH  DAILY 90 tablet 0  . BELSOMRA 15 MG TABS TAKE 1 TABLET BY MOUTH  DAILY 90 tablet 1  . buPROPion (WELLBUTRIN XL) 300 MG 24 hr tablet TAKE 1 TABLET BY MOUTH  DAILY WITH BREAKFAST (Patient taking differently: Take 300 mg by mouth daily before breakfast. ) 90 tablet 3  . Calcium Citrate-Vitamin D (CALCIUM CITRATE + D PO) Take 1 tablet by mouth 2 (two) times daily.     . Cholecalciferol (VITAMIN D-3 PO) Take 2,000 Units by mouth 2 (two) times daily.     . clonazePAM (KLONOPIN) 1 MG tablet TAKE 1 TABLET BY MOUTH  EVERY 6 HOURS AS NEEDED FOR ANXIETY 360 tablet 1  . denosumab (PROLIA) 60 MG/ML SOSY injection Inject 60 mg as directed every 6 (six) months. Last injection: Feb 2020 Next injection: August 2020    . diltiazem (CARDIZEM CD) 240 MG 24 hr capsule TAKE 1 CAPSULE BY MOUTH  DAILY (Patient taking differently: Take 240 mg by mouth daily. ) 90 capsule 3  . DULoxetine (CYMBALTA) 60 MG capsule TAKE 1 CAPSULE  BY MOUTH  TWICE DAILY (Patient taking differently: Take 60 mg by mouth 2 (two) times daily. ) 180 capsule 3  . EPINEPHrine (EPIPEN 2-PAK) 0.3 mg/0.3 mL IJ SOAJ injection Inject 0.3 mg into the muscle daily as needed (for anaphylactic reaction).    . ezetimibe (ZETIA) 10 MG tablet TAKE 1 TABLET BY MOUTH  DAILY (Patient taking differently: Take 10 mg by mouth every evening. ) 90 tablet 3  . ferrous sulfate 325 (65 FE) MG tablet Take 325 mg by mouth 2 (two) times daily with a meal.    . hydrochlorothiazide (HYDRODIURIL) 25 MG tablet Take 1 tablet (25 mg total) by mouth daily. 90 tablet 3  . HYDROcodone-acetaminophen (NORCO) 7.5-325 MG tablet Take 1 tablet by mouth 4 (four) times daily as needed for moderate pain. (Patient taking differently: Take 1 tablet by mouth every 6 (six) hours as needed for moderate pain. ) 120 tablet 0  . isosorbide mononitrate (IMDUR) 30 MG 24 hr tablet Take 1 tablet (30 mg total) by mouth daily. 90 tablet 3  . levothyroxine (SYNTHROID) 200 MCG tablet TAKE 1 TABLET BY MOUTH  DAILY AT 6 AM (Patient taking differently: Take 200 mcg by mouth daily before breakfast. ) 90 tablet 3  . levothyroxine (SYNTHROID) 25 MCG  tablet TAKE 1 TABLET BY MOUTH  DAILY BEFORE BREAKFAST (Patient taking differently: Take 25 mcg by mouth daily before breakfast. ) 90 tablet 3  . loratadine (CLARITIN) 10 MG tablet Take 10 mg by mouth daily.     . Magnesium 500 MG CAPS Take 1 capsule (500 mg total) by mouth daily. (Patient taking differently: Take 500 mg by mouth every evening. ) 30 capsule 1  . metoprolol tartrate (LOPRESSOR) 25 MG tablet TAKE 1 TABLET BY MOUTH TWO  TIMES DAILY 180 tablet 2  . mirabegron ER (MYRBETRIQ) 25 MG TB24 tablet Take 25 mg by mouth at bedtime.     . Multiple Vitamin (MULTIVITAMIN WITH MINERALS) TABS tablet Take 1 tablet by mouth daily. Men's One-A-Day 52+    . nitroGLYCERIN (NITROSTAT) 0.4 MG SL tablet Place 0.4 mg under the tongue every 5 (five) minutes as needed for chest pain.  x3 doses as needed for chest pain    . omeprazole (PRILOSEC) 40 MG capsule TAKE 1 CAPSULE BY MOUTH  DAILY BEFORE BREAKFAST (Patient taking differently: Take 40 mg by mouth daily before breakfast. ) 90 capsule 3  . OTEZLA 30 MG TABS Take 30 mg by mouth 2 (two) times a day.    . Probiotic Product (PROBIOTIC PO) Take 1 capsule by mouth daily with breakfast.    . rivaroxaban (XARELTO) 10 MG TABS tablet Take 2 tablets (20 mg total) by mouth daily.    [name] testosterone enanthate (DELATESTRYL) 200 MG/ML injection INJECT 1ML (200MG ) INTO THE MUSCLE EVERY 14 DAYS. DISCARD VIAL AFTER 28 DAYS. (Patient taking differently: Inject 200 mg into the muscle every 14 (fourteen) days. INJECT 1ML (200MG ) INTO THE MUSCLE EVERY 14 DAYS. DISCARD VIAL AFTER 28 DAYS.) 5 mL 3  . tiZANidine (ZANAFLEX) 4 MG tablet TAKE 1 TABLET BY MOUTH  EVERY 6 HOURS AS NEEDED FOR MUSCLE SPASM(S) 360 tablet 0  . vitamin C (ASCORBIC ACID) 500 MG tablet Take 500 mg by mouth daily.    [name] doxycycline (VIBRA-TABS) 100 MG tablet Take 1 tablet (100 mg total) by mouth daily. 90 tablet 3   Facility-Administered Medications Prior to Visit  Medication Dose Route Frequency Provider Last Rate Last Admin  . sodium chloride flush (NS) 0.9 % injection 3 mL  3 mL Intravenous Q12H Skeet Latch, MD        ROS: Review of Systems  Constitutional: Positive for fatigue. Negative for appetite change and unexpected weight change.  HENT: Negative for congestion, nosebleeds, sneezing, sore throat and trouble swallowing.   Eyes: Negative for itching and visual disturbance.  Respiratory: Negative for cough.   Cardiovascular: Negative for chest pain, palpitations and leg swelling.  Gastrointestinal: Negative for abdominal distention, blood in stool, diarrhea and nausea.  Genitourinary: Positive for decreased urine volume and urgency. Negative for frequency and hematuria.  Musculoskeletal: Positive for arthralgias, back pain and gait problem. Negative for joint  swelling and neck pain.  Skin: Negative for rash.  Neurological: Negative for dizziness, tremors, speech difficulty and weakness.  Psychiatric/Behavioral: Negative for agitation, dysphoric mood and sleep disturbance. The patient is nervous/anxious.     Objective:  BP (!) 146/82 (BP Location: Left Arm, Patient Position: Sitting, Cuff Size: Large)   Pulse 87   Temp 98 F (36.7 C) (Oral)   Ht 6\' 3"  (1.905 m)   Wt 293 lb (132.9 kg)   SpO2 96%   BMI 36.62 kg/m   BP Readings from Last 3 Encounters:  09/20/19 (!) 146/82  09/10/19 (!) 150/74  08/15/19  133/75    Wt Readings from Last 3 Encounters:  09/20/19 293 lb (132.9 kg)  09/10/19 290 lb 9.6 oz (131.8 kg)  08/15/19 298 lb (135.2 kg)    Physical Exam Constitutional:      General: He is not in acute distress.    Appearance: He is well-developed.     Comments: NAD  Eyes:     Conjunctiva/sclera: Conjunctivae normal.     Pupils: Pupils are equal, round, and reactive to light.  Neck:     Thyroid: No thyromegaly.     Vascular: No JVD.  Cardiovascular:     Rate and Rhythm: Normal rate and regular rhythm.     Heart sounds: Normal heart sounds. No murmur. No friction rub. No gallop.   Pulmonary:     Effort: Pulmonary effort is normal. No respiratory distress.     Breath sounds: Normal breath sounds. No wheezing or rales.  Chest:     Chest wall: No tenderness.  Abdominal:     General: Bowel sounds are normal. There is no distension.     Palpations: Abdomen is soft. There is no mass.     Tenderness: There is no abdominal tenderness. There is no guarding or rebound.  Musculoskeletal:        General: Tenderness and deformity present. Normal range of motion.     Cervical back: Normal range of motion.     Right lower leg: Edema present.     Left lower leg: Edema present.  Lymphadenopathy:     Cervical: No cervical adenopathy.  Skin:    General: Skin is warm and dry.     Findings: No rash.  Neurological:     Mental Status: He  is alert and oriented to person, place, and time.     Cranial Nerves: No cranial nerve deficit.     Motor: Weakness present. No abnormal muscle tone.     Coordination: Coordination normal.     Gait: Gait abnormal.     Deep Tendon Reflexes: Reflexes are normal and symmetric.  Psychiatric:        Behavior: Behavior normal.        Thought Content: Thought content normal.        Judgment: Judgment normal.   walker Extensive OA  Lab Results  Component Value Date   WBC 4.4 08/09/2019   HGB 13.6 08/13/2019   HCT 40.0 08/13/2019   PLT 141 (L) 08/09/2019   GLUCOSE 104 (H) 08/09/2019   CHOL 111 10/12/2018   TRIG 64 10/12/2018   HDL 56 10/12/2018   LDLCALC 42 10/12/2018   ALT 20 08/03/2019   AST 23 08/03/2019   NA 140 08/13/2019   K 3.7 08/13/2019   CL 98 08/09/2019   CREATININE 1.12 08/09/2019   BUN 18 08/09/2019   CO2 25 08/09/2019   TSH 0.130 (L) 11/09/2018   PSA 1.01 10/02/2018   INR 1.0 03/26/2019   HGBA1C 5.8 (H) 02/06/2013    No results found.  Assessment & Plan:   There are no diagnoses linked to this encounter.   No orders of the defined types were placed in this encounter.    Follow-up: No follow-ups on file.  Walker Kehr, MD

## 2019-09-20 NOTE — Patient Instructions (Signed)
Wt Readings from Last 3 Encounters:  09/20/19 293 lb (132.9 kg)  09/10/19 290 lb 9.6 oz (131.8 kg)  08/15/19 298 lb (135.2 kg)

## 2019-09-20 NOTE — Assessment & Plan Note (Signed)
Lasix

## 2019-09-20 NOTE — Assessment & Plan Note (Signed)
BP Readings from Last 3 Encounters:  09/20/19 (!) 146/82  09/10/19 (!) 150/74  08/15/19 133/75

## 2019-09-20 NOTE — Assessment & Plan Note (Signed)
Norco  Potential benefits of a long term opioids use as well as potential risks (i.e. addiction risk, apnea etc) and complications (i.e. Somnolence, constipation and others) were explained to the patient and were aknowledged. 

## 2019-09-20 NOTE — Assessment & Plan Note (Signed)
Vit D  po 

## 2019-09-22 MED ORDER — TESTOSTERONE ENANTHATE 200 MG/ML IM SOLN: INTRAMUSCULAR | 3 refills | Status: DC

## 2019-10-16 DIAGNOSIS — N200 Calculus of kidney: Secondary | ICD-10-CM | POA: Diagnosis not present

## 2019-10-17 ENCOUNTER — Encounter (HOSPITAL_BASED_OUTPATIENT_CLINIC_OR_DEPARTMENT_OTHER): Payer: Self-pay | Admitting: Urology

## 2019-10-17 ENCOUNTER — Other Ambulatory Visit: Payer: Self-pay

## 2019-10-17 DIAGNOSIS — E785 Hyperlipidemia, unspecified: Secondary | ICD-10-CM | POA: Diagnosis not present

## 2019-10-17 DIAGNOSIS — Z79899 Other long term (current) drug therapy: Secondary | ICD-10-CM | POA: Diagnosis not present

## 2019-10-17 LAB — HEPATIC FUNCTION PANEL
ALT: 44 IU/L (ref 0–44)
AST: 31 IU/L (ref 0–40)
Albumin: 4.3 g/dL (ref 3.7–4.7)
Alkaline Phosphatase: 88 IU/L (ref 39–117)
Bilirubin Total: 0.7 mg/dL (ref 0.0–1.2)
Bilirubin, Direct: 0.29 mg/dL (ref 0.00–0.40)
Total Protein: 7.1 g/dL (ref 6.0–8.5)

## 2019-10-17 LAB — LIPID PANEL
Chol/HDL Ratio: 2.1 ratio (ref 0.0–5.0)
Cholesterol, Total: 115 mg/dL (ref 100–199)
HDL: 55 mg/dL (ref >39)
LDL Chol Calc (NIH): 49 mg/dL (ref 0–99)
Triglycerides: 47 mg/dL (ref 0–149)
VLDL Cholesterol Cal: 11 mg/dL (ref 5–40)

## 2019-10-17 NOTE — Progress Notes (Addendum)
ADDENDUM:  Chart reviewed by anesthesia, Konrad Felix PA, ok to proceed, refer to her progress note.   Spoke w/ via phone for pre-op interview--- PT  Lab needs dos----  Istat 8             Lab results------ current cxr/ ekg in chart/ epic COVID test ------ 10-18-2019 @ 1030  Arrive at ------- 0900 NPO after ------  MN Medications to take morning of surgery ----- Synthroid, Prilosec, Wellbutrin, Imdur, Lopressor, Claritin, Diltiazem, Cymbalta w/ sips of water  Diabetic medication ----- n/a Patient Special Instructions ----- asked to bring cpap mask / tubing dos  Pre-Op special Istructions ----- pt has pcp clearance to stop xarelto dated 09-26-2019 with chart  Patient verbalized understanding of instructions that were given at this phone interview. Patient denies shortness of breath, chest pain, fever, cough a this phone interview.   Anesthesia Review:  Hx CAD , Persistant AFib, CHF diastolic, hx lymphoma in remission , OSA (uses cpap every night), IDA, GERD.  Pt denies any cardiac s&s, SOB, or peripheral swelling  PCP:  Dr Konrad Dolores (lov 09-20-2019 epic)  Cardiologist : Dr t. Oval Linsey (lov 09-10-2019 epic) Chest x-ray :  06-15-2019 epic EKG : 08-13-2019 epic Echo : 06-28-2019 epic Stress test:  no Cardiac Cath :  Last one in epic 08-13-2019 Sleep Study/ CPAP : Yes/ YES  Blood Thinner/ Instructions Maryjane Hurter Dose:  Xarelto/  Per pt was given instruction from his pcp to stop 72 hours prior to surgery (clearance with chart)  ASA / Instructions/ Last Dose :  ASA 81mg /  Per Dr Alyson Ingles pt to continue taking asa. Pt stated given instructions to continue Dr Alyson Ingles office

## 2019-10-18 ENCOUNTER — Other Ambulatory Visit (HOSPITAL_COMMUNITY)
Admission: RE | Admit: 2019-10-18 | Discharge: 2019-10-18 | Disposition: A | Discharge status: Home / Self Care | Payer: Medicare PPO | Source: Ambulatory Visit | Attending: Urology | Admitting: Urology

## 2019-10-18 DIAGNOSIS — Z01812 Encounter for preprocedural laboratory examination: Secondary | ICD-10-CM | POA: Insufficient documentation

## 2019-10-18 DIAGNOSIS — Z20822 Contact with and (suspected) exposure to covid-19: Secondary | ICD-10-CM | POA: Diagnosis not present

## 2019-10-19 NOTE — Anesthesia Preprocedure Evaluation (Addendum)
Anesthesia Evaluation  Patient identified by MRN, date of birth, ID band Patient awake    Reviewed: Allergy & Precautions, NPO status , Patient's Chart, lab work & pertinent test results, reviewed documented beta blocker date and time   Airway Mallampati: I  TM Distance: >3 FB Neck ROM: Full    Dental  (+) Dental Advisory Given, Poor Dentition, Chipped,    Pulmonary shortness of breath, sleep apnea and Continuous Positive Airway Pressure Ventilation , COPD,  COPD inhaler, former smoker,    Pulmonary exam normal breath sounds clear to auscultation       Cardiovascular hypertension, Pt. on medications and Pt. on home beta blockers + CAD and +CHF (diastolic CHF)  Normal cardiovascular exam+ dysrhythmias Atrial Fibrillation  Rhythm:Regular Rate:Normal  EKG: 08/13/2019 Rate 75bpm  Atrial fibrillation  Left axis deviation  Septal infarct, age undetermined   CV: Cardiac Cath 08/13/2019 1.Significant two-vessel coronary artery disease, including sequential 60% proximal and 70% mid LAD stenosis, which are hemodynamically significant by DFR and also involve large D2 branch, as well as chronic total occlusion of the mid RCA with bridging and left-to-right collaterals. 2.Mildly elevated left heart and pulmonary artery pressures. Prominent v-waves noted on PCWP; question if mitral regurgitation is more severe than appreciated on recent transthoracic echocardiogram. 3.Moderately elevated right heart filling pressures. 4.Normal Fick cardiac output/index.  Recommendations: 1.Outpatient cardiac surgery evaluation, given significant two vessel coronary artery disease including proximal LAD involvement and CTO of the RCA. If comorbidities pose an excessively high perioperative risk, I would favor maximizing medical therapy. If the patient has refractory symptoms, PCI to the proximal/mid LAD and D2 could be considered. I would defer PCI to CTO of  the RCA, as this has reportedly been present for 20+ years. 2.Consider gentle diuresis and continued medical management of chronic HFpEF and persistent atrial fibrillation. 3.Aggressive secondary prevention.  TTE 06/2019 EF 50-55%, mildly reduced RV function, mild MR   Neuro/Psych PSYCHIATRIC DISORDERS Anxiety Depression negative neurological ROS  negative psych ROS   GI/Hepatic Neg liver ROS, GERD  ,  Endo/Other  Hypothyroidism (grave's disease s/p thyroidectomy)   Renal/GU negative Renal ROS  negative genitourinary   Musculoskeletal negative musculoskeletal ROS (+) Arthritis , Osteoarthritis,    Abdominal   Peds  Hematology  (+) Blood dyscrasia (on xarelto), ,   Anesthesia Other Findings Cardiac clearance on 09/10/19, pt asymptomatic from cardiac standpoint  Reproductive/Obstetrics                                                         Anesthesia Evaluation  Patient identified by MRN, date of birth, ID band Patient awake    Reviewed: Allergy & Precautions, NPO status , Patient's Chart, lab work & pertinent test results, reviewed documented beta blocker date and time   Airway Mallampati: I  TM Distance: >3 FB Neck ROM: Full    Dental no notable dental hx. (+) Teeth Intact, Dental Advisory Given   Pulmonary sleep apnea and Continuous Positive Airway Pressure Ventilation , COPD,  COPD inhaler, former smoker,    Pulmonary exam normal breath sounds clear to auscultation       Cardiovascular hypertension, Pt. on home beta blockers and Pt. on medications + CAD and + Past MI  Normal cardiovascular exam+ dysrhythmias Atrial Fibrillation  Rhythm:Regular Rate:Normal  EKG: 11/08/2018 Rate 83  bpm Atrial fibrillation  Ventricular premature complex LAD, consider left anterior fascicular block  Low voltage, precordial leads No significant change was found  Echo 06/14/2018 Study Conclusions - Left ventricle: Apical hypokinesis abnormal  GLS -13.9. The cavitysize was normal. Wall thickness was normal. Systolic function was normal. The estimated ejection fraction was in the range of 50%to 55%. Left ventricular diastolic function parameters were normal. - Mitral valve: There was mild regurgitation. - Left atrium: The atrium was moderately dilated. - Atrial septum: No defect or patent foramen ovale was identified.   Neuro/Psych PSYCHIATRIC DISORDERS Anxiety Depression negative neurological ROS     GI/Hepatic Neg liver ROS, GERD  ,  Endo/Other  Hypothyroidism (Graves disease s/p thyroidectomy)   Renal/GU negative Renal ROS  negative genitourinary   Musculoskeletal negative musculoskeletal ROS (+)   Abdominal   Peds  Hematology  (+) Blood dyscrasia (on xarelto), , Diffuse large B cell lymphoma on chemo   Anesthesia Other Findings Left renal calculus  Reproductive/Obstetrics                           Anesthesia Physical Anesthesia Plan  ASA: III  Anesthesia Plan: General   Post-op Pain Management:    Induction: Intravenous  PONV Risk Score and Plan: 2 and Dexamethasone and Ondansetron  Airway Management Planned: Oral ETT  Additional Equipment:   Intra-op Plan:   Post-operative Plan: Extubation in OR  Informed Consent: I have reviewed the patients History and Physical, chart, labs and discussed the procedure including the risks, benefits and alternatives for the proposed anesthesia with the patient or authorized representative who has indicated his/her understanding and acceptance.     Dental advisory given  Plan Discussed with: CRNA  Anesthesia Plan Comments:        Anesthesia Quick Evaluation                                   Anesthesia Evaluation  Patient identified by MRN, date of birth, ID band Patient awake    Reviewed: Allergy & Precautions, NPO status , Patient's Chart, lab work & pertinent test results, reviewed documented beta blocker date and time    Airway Mallampati: I  TM Distance: >3 FB Neck ROM: Full    Dental no notable dental hx. (+) Teeth Intact, Dental Advisory Given   Pulmonary sleep apnea and Continuous Positive Airway Pressure Ventilation , COPD,  COPD inhaler, former smoker,    Pulmonary exam normal breath sounds clear to auscultation       Cardiovascular hypertension, Pt. on home beta blockers and Pt. on medications + CAD and + Past MI  Normal cardiovascular exam+ dysrhythmias Atrial Fibrillation  Rhythm:Regular Rate:Normal  EKG: 11/08/2018 Rate 83 bpm Atrial fibrillation  Ventricular premature complex LAD, consider left anterior fascicular block  Low voltage, precordial leads No significant change was found  Echo 06/14/2018 Study Conclusions - Left ventricle: Apical hypokinesis abnormal GLS -13.9. The cavitysize was normal. Wall thickness was normal. Systolic function was normal. The estimated ejection fraction was in the range of 50%to 55%. Left ventricular diastolic function parameters were normal. - Mitral valve: There was mild regurgitation. - Left atrium: The atrium was moderately dilated. - Atrial septum: No defect or patent foramen ovale was identified.   Neuro/Psych PSYCHIATRIC DISORDERS Anxiety Depression negative neurological ROS     GI/Hepatic Neg liver ROS, GERD  ,  Endo/Other  Hypothyroidism (Graves  disease s/p thyroidectomy)   Renal/GU negative Renal ROS  negative genitourinary   Musculoskeletal negative musculoskeletal ROS (+)   Abdominal   Peds  Hematology  (+) Blood dyscrasia (on xarelto), , Diffuse large B cell lymphoma on chemo   Anesthesia Other Findings Left renal calculus  Reproductive/Obstetrics                           Anesthesia Physical Anesthesia Plan  ASA: III  Anesthesia Plan: General   Post-op Pain Management:    Induction: Intravenous  PONV Risk Score and Plan: 2 and Dexamethasone and Ondansetron  Airway  Management Planned: Oral ETT  Additional Equipment:   Intra-op Plan:   Post-operative Plan: Extubation in OR  Informed Consent: I have reviewed the patients History and Physical, chart, labs and discussed the procedure including the risks, benefits and alternatives for the proposed anesthesia with the patient or authorized representative who has indicated his/her understanding and acceptance.     Dental advisory given  Plan Discussed with: CRNA  Anesthesia Plan Comments:        Anesthesia Quick Evaluation                                   Anesthesia Evaluation  Patient identified by MRN, date of birth, ID band Patient awake    Reviewed: Allergy & Precautions, NPO status , Patient's Chart, lab work & pertinent test results, reviewed documented beta blocker date and time   Airway Mallampati: I  TM Distance: >3 FB Neck ROM: Full    Dental no notable dental hx. (+) Teeth Intact, Dental Advisory Given   Pulmonary sleep apnea and Continuous Positive Airway Pressure Ventilation , COPD,  COPD inhaler, former smoker,    Pulmonary exam normal breath sounds clear to auscultation       Cardiovascular hypertension, Pt. on home beta blockers and Pt. on medications + CAD and + Past MI  Normal cardiovascular exam+ dysrhythmias Atrial Fibrillation  Rhythm:Regular Rate:Normal  EKG: 11/08/2018 Rate 83 bpm Atrial fibrillation  Ventricular premature complex LAD, consider left anterior fascicular block  Low voltage, precordial leads No significant change was found  Echo 06/14/2018 Study Conclusions - Left ventricle: Apical hypokinesis abnormal GLS -13.9. The cavitysize was normal. Wall thickness was normal. Systolic function was normal. The estimated ejection fraction was in the range of 50%to 55%. Left ventricular diastolic function parameters were normal. - Mitral valve: There was mild regurgitation. - Left atrium: The atrium was moderately dilated. - Atrial  septum: No defect or patent foramen ovale was identified.   Neuro/Psych PSYCHIATRIC DISORDERS Anxiety Depression negative neurological ROS     GI/Hepatic Neg liver ROS, GERD  ,  Endo/Other  Hypothyroidism (Graves disease s/p thyroidectomy)   Renal/GU negative Renal ROS  negative genitourinary   Musculoskeletal negative musculoskeletal ROS (+)   Abdominal   Peds  Hematology  (+) Blood dyscrasia (on xarelto), , Diffuse large B cell lymphoma on chemo   Anesthesia Other Findings Left renal calculus  Reproductive/Obstetrics                           Anesthesia Physical Anesthesia Plan  ASA: III  Anesthesia Plan: General   Post-op Pain Management:    Induction: Intravenous  PONV Risk Score and Plan: 2 and Dexamethasone and Ondansetron  Airway Management Planned: Oral ETT  Additional Equipment:  Intra-op Plan:   Post-operative Plan: Extubation in OR  Informed Consent: I have reviewed the patients History and Physical, chart, labs and discussed the procedure including the risks, benefits and alternatives for the proposed anesthesia with the patient or authorized representative who has indicated his/her understanding and acceptance.     Dental advisory given  Plan Discussed with: CRNA  Anesthesia Plan Comments:        Anesthesia Quick Evaluation  Anesthesia Physical Anesthesia Plan  ASA: III  Anesthesia Plan: General   Post-op Pain Management:    Induction: Intravenous  PONV Risk Score and Plan: 2 and Ondansetron, Dexamethasone and Midazolam  Airway Management Planned: LMA  Additional Equipment:   Intra-op Plan:   Post-operative Plan: Extubation in OR  Informed Consent: I have reviewed the patients History and Physical, chart, labs and discussed the procedure including the risks, benefits and alternatives for the proposed anesthesia with the patient or authorized representative who has indicated his/her understanding  and acceptance.     Dental advisory given  Plan Discussed with: CRNA  Anesthesia Plan Comments:        Anesthesia Quick Evaluation

## 2019-10-19 NOTE — Progress Notes (Signed)
Anesthesia Chart Review   Case: [id number] Date/Time: 10/22/19 1045   Procedures:      CYSTOSCOPY WITH RETROGRADE PYELOGRAM, URETEROSCOPY AND STENT PLACEMENT (Right ) - 2 HRS     HOLMIUM LASER APPLICATION (Right )   Anesthesia type: General   Pre-op diagnosis: RIGHT RENAL CALCULI   Location: [facility] OR ROOM 2 / [place]   Surgeons: Cleon Gustin, MD      DISCUSSION:73 y.o. former smoker (50 pack years, quit 01/12/12) with h/o OSA on CPAP, HLD, s/p total thyroidectomy, HTN, GERD, HLD, A-fib (on Xarelto), CAD, diastolic CHF, diffuse large B cell lymphoma 2019 w/o 6 cycles of chemo, right renal calculi scheduled for above procedure 11/02/2019 with Dr. [name].    Cardiac Cath 08/13/2019 with significant two vessel CAD including proximal LAD involvement and CTO of the RCA.  Medical therapy recommended as revascularization would not significantly impact shortness of breath which is felt to be multifactorial.   Per cardio if he experiences refractory symptoms PCA to the proximal/mid LAD and D2 could be considered.  Pt last seen by cardiology 09/10/2019. Per OV note, "Lengthy history as above.  He continues on medical therapy at this time with statin, metoprolol, isosorbide.  He is completely asymptomatic at this time.  He admits to not being very active, as he normally likes to go to the [facility] and swim.  Now he is walking outside for several feet, trying to work back up to 30 minutes a day.  There is no discomfort with exertion.  For now continue current medication regimen.  He is to report any new symptoms of shortness of breath chest pressure or profound fatigue.  If this does occur, may need to revisit intervention as discussed above."  Stable without recurrence of sx per PAT nurse interview.   Cleared by PCP and advised to hold Xarelto, on chart.   Anticipate pt can proceed with planned procedure barring acute status change and after evaluation DOS.  VS: Ht 6\' 3"  (1.905 m)    Wt 129.3 kg   BMI 35.62 kg/m   PROVIDERS: Plotnikov, Evie Lacks, MD is PCP   Fransico Him, MD is Cardiologist  LABS: labs DOS (all labs ordered are listed, but only abnormal results are displayed)  Labs Reviewed - No data to display   IMAGES:   EKG: 08/13/2019 Rate 75bpm  Atrial fibrillation  Left axis deviation  Septal infarct, age undetermined   CV: Cardiac Cath 08/13/2019 Conclusions: 1. Significant two-vessel coronary artery disease, including sequential 60% proximal and 70% mid LAD stenosis, which are hemodynamically significant by DFR and also involve large D2 branch, as well as chronic total occlusion of the mid RCA with bridging and left-to-right collaterals. 2. Mildly elevated left heart and pulmonary artery pressures.  Prominent v-waves noted on PCWP; question if mitral regurgitation is more severe than appreciated on recent transthoracic echocardiogram. 3. Moderately elevated right heart filling pressures. 4. Normal Fick cardiac output/index.  Recommendations: 1. Outpatient cardiac surgery evaluation, given significant two vessel coronary artery disease including proximal LAD involvement and CTO of the RCA.  If comorbidities pose an excessively high perioperative risk, I would favor maximizing medical therapy.  If the patient has refractory symptoms, PCI to the proximal/mid LAD and D2 could be considered.  I would defer PCI to CTO of the RCA, as this has reportedly been present for 20+ years. 2. Consider gentle diuresis and continued medical management of chronic HFpEF and persistent atrial fibrillation. 3. Aggressive secondary prevention.  Echo 06/28/2019 IMPRESSIONS    1. Left ventricular ejection fraction, by visual estimation, is 50 to 55%. The left ventricle has normal function. Left ventricular septal wall thickness was normal. Normal left ventricular posterior wall thickness. There is no left ventricular  hypertrophy.  2. Left ventricular diastolic  function could not be evaluated pattern of LV diastolic filling.  3. GLS = -10.1%.  4. Global right ventricle has mildly reduced systolic function.The right ventricular size is normal. No increase in right ventricular wall thickness.  5. Left atrial size was moderately dilated.  6. Right atrial size was moderately dilated.  7. The mitral valve is normal in structure. Mild mitral valve regurgitation.  8. The tricuspid valve is normal in structure. Tricuspid valve regurgitation was not visualized by color flow Doppler.  9. The aortic valve The aortic valve is grossly normal Aortic valve regurgitation was not visualized by color flow Doppler. Structurally normal aortic valve, with no evidence of sclerosis or stenosis. 10. The pulmonic valve was grossly normal. Pulmonic valve regurgitation is not visualized by color flow Doppler. 11. The atrial septum is grossly normal. Past Medical History:  Diagnosis Date  . Anticoagulant long-term use    xarelto--- takes for AFib,  managed by pcp, Dr Alain Marion  . Benign localized prostatic hyperplasia with lower urinary tract symptoms (LUTS)   . CAD (coronary artery disease) cardiologist--  dr t. Oval Linsey   hx of known CAD obstruction w/ collaterals (cath done @ [facility] 12-31-2011) ;   last cardiac cath 08-13-2019  showed sig. 2V CAD involving proxLAD and CTO of the RCA/  chronic total occlusion midRCA w/ bridging and L>R collaterals  . Diastolic CHF ([place])    dx [id number] hospital admission (followed by pcp)  . Diffuse large B cell lymphoma [facility]) oncologist-- dr Irene Limbo--- in remission   dx 08/ 2019 -- bx left tonsill mass-- involving lymph nodes--- completed chemotherapy 10-12-2018  . DJD (degenerative joint disease)   . Dysthymic disorder   . Elevated brain natriuretic peptide (BNP) level   . Environmental allergies   . GAD (generalized anxiety disorder)   . GERD (gastroesophageal reflux disease)   . History of falling    multiple times  . History of Graves'  disease    1987 s/p total thyroidectomy  . History of kidney stones   . History of scabies    07/ 2014  resolved  . History of syncope    per D/C note in epic 02-21-2013 ?vasovagal  . History of vertebral compression fracture    05/ 2014 --  L1, no surgical intervention  . HLD (hyperlipidemia)   . Hyperlipidemia   . Hypertension   . Hypogonadism in male   . IDA (iron deficiency anemia)   . Lumbago   . OA (osteoarthritis)   . OSA on CPAP    cpap machine-settings 17  . Osteoporosis    Severe  . Persistent atrial fibrillation [facility])    cardiologist--- dr r. Oval Linsey  . Plaque psoriasis    dermatologist--- dr Harriett Sine--- currently taking otezla  . Post-surgical hypothyroidism    followed by pcp---  s/p total thyroidectomy for graves disease in 1987  . Renal calculus, right   . Thrombocytopenia ([place])   . Unsteady gait    uses walker  . Wears hearing aid in both ears     Past Surgical History:  Procedure Laterality Date  . APPENDECTOMY  2011 approx.   [name] CARDIAC CATHETERIZATION  12-31-2011   dr Lenna Sciara. Beatrix Fetters @[facility]   normal LVF w/ multivessel CAD,  occluded RCA w/ colleterals  . CATARACT EXTRACTION W/ INTRAOCULAR LENS  IMPLANT, BILATERAL  2016 approx.  . COLONOSCOPY    . CYSTOSCOPY WITH RETROGRADE PYELOGRAM, URETEROSCOPY AND STENT PLACEMENT Left 11/07/2017   Procedure: CYSTOSCOPY WITH RETROGRADE PYELOGRAM, URETEROSCOPY AND STENT PLACEMENT;  Surgeon: Cleon Gustin, MD;  Location: [facility];  Service: Urology;  Laterality: Left;  . CYSTOSCOPY/URETEROSCOPY/HOLMIUM LASER/STENT PLACEMENT Right 03/11/2017   Procedure: CYSTOSCOPY/URETEROSCOPYSTENT PLACEMENT right ureter retrograde pylegram;  Surgeon: Cleon Gustin, MD;  Location: [facility];  Service: Urology;  Laterality: Right;  . HARDWARE REMOVAL  10/05/2012   Procedure: HARDWARE REMOVAL;  Surgeon: [name], MD;  [facility];  Service: Orthopedics;  Laterality: Right;  REMOVING  STRYKER  GAMMA NAIL  . HARDWARE  REMOVAL Right 07/03/2013   Procedure: HARDWARE REMOVAL RIGHT TIBIA ;  Surgeon: Rozanna Box, MD;  Location: [facility];  Service: Orthopedics;  Laterality: Right;  . HIP CLOSED REDUCTION Right 10/15/2013   Procedure: CLOSED MANIPULATION HIP;  Surgeon: [name], MD;  [facility];  Service: Orthopedics;  Laterality: Right;  . HOLMIUM LASER APPLICATION Right [id number]   Procedure: HOLMIUM LASER APPLICATION;  Surgeon: Cleon Gustin, MD;  [facility];  Service: Urology;  Laterality: Right;  . HOLMIUM LASER APPLICATION Left [id number]   Procedure: HOLMIUM LASER APPLICATION;  Surgeon: Cleon Gustin, MD;  [facility];  Service: Urology;  Laterality: Left;  . HOLMIUM LASER APPLICATION Left [id number]   Procedure: HOLMIUM LASER APPLICATION;  Surgeon: Cleon Gustin, MD;  [facility];  Service: Urology;  Laterality: Left;  . INCISION AND DRAINAGE HIP Right 11/16/2013   Procedure: IRRIGATION AND DEBRIDEMENT RIGHT HIP;  Surgeon: [name], MD;  [facility];  Service: Orthopedics;  Laterality: Right;  . INTRAVASCULAR PRESSURE WIRE/FFR STUDY N/A 08/13/2019   Procedure: INTRAVASCULAR PRESSURE WIRE/FFR STUDY;  Surgeon: Nelva Bush, MD;  Location: [facility];  Service: Cardiovascular;  Laterality: N/A;  . IR IMAGING GUIDED PORT INSERTION  06/22/2018  . IR NEPHROSTOMY PLACEMENT LEFT  03/28/2019  . IR URETERAL STENT LEFT NEW ACCESS W/O SEP NEPHROSTOMY CATH  10/24/2017  . IR URETERAL STENT LEFT NEW ACCESS W/O SEP NEPHROSTOMY CATH  03/26/2019  . NEPHROLITHOTOMY Right 02/08/2017   Procedure: NEPHROLITHOTOMY PERCUTANEOUS WITH SURGEON ACCESS;  Surgeon: Cleon Gustin, MD;  [facility];  Service: Urology;  Laterality: Right;  . NEPHROLITHOTOMY Left 10/24/2017   Procedure: NEPHROLITHOTOMY PERCUTANEOUS;  Surgeon: Cleon Gustin, MD;  [facility];  Service: Urology;  Laterality: Left;  . NEPHROLITHOTOMY Left 03/26/2019   Procedure: NEPHROLITHOTOMY  PERCUTANEOUS;  Surgeon: Cleon Gustin, MD;  [facility];  Service: Urology;  Laterality: Left;  2 HRS  . NEPHROLITHOTOMY Left 04/17/2019   Procedure: NEPHROLITHOTOMY PERCUTANEOUS;  Surgeon: Cleon Gustin, MD;  [facility];  Service: Urology;  Laterality: Left;  2 HRS  . ORIF TIBIA FRACTURE Right 02/06/2013   Procedure: OPEN REDUCTION INTERNAL FIXATION (ORIF) TIBIA FRACTURE WITH IM ROD FIBULA;  Surgeon: Rozanna Box, MD;  Location: [place];  Service: Orthopedics;  Laterality: Right;  . ORIF TIBIA FRACTURE Right 07/03/2013   Procedure: RIGHT TIBIA NON UNION REPAIR ;  Surgeon: Rozanna Box, MD;  Location: [facility];  Service: Orthopedics;  Laterality: Right;  . ORIF WRIST FRACTURE  10/02/2012   Procedure: OPEN REDUCTION INTERNAL FIXATION (ORIF) WRIST FRACTURE;  Surgeon: Roseanne Kaufman, MD;  [facility];  Service:  Orthopedics;  Laterality: Right;  WITH   ANTIBIOTIC  CEMENT  . ORIF WRIST FRACTURE Left 10/28/2013   Procedure: OPEN REDUCTION INTERNAL FIXATION (ORIF) WRIST FRACTURE with allograft;  Surgeon: Roseanne Kaufman, MD;  [facility];  Service: Orthopedics;  Laterality: Left;  DVR Plate  . QUADRICEPS TENDON REPAIR Left 07/15/2017   Procedure: REPAIR QUADRICEP TENDON;  Surgeon: Frederik Pear, MD;  Location: [place];  Service: Orthopedics;  Laterality: Left;  . RIGHT/LEFT HEART CATH AND CORONARY ANGIOGRAPHY N/A 08/13/2019   Procedure: RIGHT/LEFT HEART CATH AND CORONARY ANGIOGRAPHY;  Surgeon: Nelva Bush, MD;  Location: [facility];  Service: Cardiovascular;  Laterality: N/A;  . THYROIDECTOMY  02/1986  . TOTAL HIP ARTHROPLASTY Right 03-16-2016   @[facility]   . TOTAL HIP REVISION  10/05/2012   Procedure: TOTAL HIP REVISION;  Surgeon: [name], MD;  [facility];  Service: Orthopedics;  Laterality: Right;  RIGHT TOTAL HIP REVISION  . TOTAL HIP REVISION Right 09/17/2013   Procedure: REVISION RIGHT TOTAL HIP ARTHROPLASTY ;  Surgeon: [name], MD;  [facility];  Service: Orthopedics;  Laterality: Right;  . TOTAL HIP REVISION Right 10/26/2013   Procedure: REVISION RIGHT TOTAL HIP ARTHROPLASTY;  Surgeon: [name], MD;  [facility];  Service: Orthopedics;  Laterality: Right;  . TOTAL KNEE ARTHROPLASTY Bilateral right 03-15-2011;  left 06-30-2011  . TOTAL KNEE REVISION Left 04/11/2017   Procedure: TOTAL KNEE REVISION PATELLA and TIBIA;  Surgeon: Frederik Pear, MD;  Location: [place];  Service: Orthopedics;  Laterality: Left;    MEDICATIONS: No current facility-administered medications for this encounter.   [name] acetaminophen (TYLENOL) 500 MG tablet  . alfuzosin (UROXATRAL) 10 MG 24 hr tablet  . amoxicillin-clavulanate (AUGMENTIN) 500-125 MG tablet  . aspirin EC 81 MG tablet  . atorvastatin (LIPITOR) 80 MG tablet  . BELSOMRA 15 MG TABS  . buPROPion (WELLBUTRIN XL) 300 MG 24 hr tablet  . Calcium Citrate-Vitamin D (CALCIUM CITRATE + D PO)  . Cholecalciferol (VITAMIN D-3 PO)  . clonazePAM (KLONOPIN) 1 MG tablet  . diltiazem (CARDIZEM CD) 240 MG 24 hr capsule  . DULoxetine (CYMBALTA) 60 MG capsule  . EPINEPHrine (EPIPEN 2-PAK) 0.3 mg/0.3 mL IJ SOAJ injection  . ezetimibe (ZETIA) 10 MG tablet  . ferrous sulfate 325 (65 FE) MG tablet  . HYDROcodone-acetaminophen (NORCO) 7.5-325 MG tablet  . isosorbide mononitrate (IMDUR) 30 MG 24 hr tablet  . levothyroxine (SYNTHROID) 200 MCG tablet  . levothyroxine (SYNTHROID) 25 MCG tablet  . loratadine (CLARITIN) 10 MG tablet  . Magnesium 500 MG CAPS  . metoprolol tartrate (LOPRESSOR) 25 MG tablet  . mirabegron ER (MYRBETRIQ) 25 MG TB24 tablet  . Multiple Vitamin (MULTIVITAMIN WITH MINERALS) TABS tablet  . omeprazole (PRILOSEC) 40 MG capsule  . OTEZLA 30 MG TABS  . Probiotic Product (PROBIOTIC PO)  . rivaroxaban (XARELTO) 10 MG TABS tablet  . testosterone enanthate (DELATESTRYL) 200 MG/ML injection  . tiZANidine (ZANAFLEX) 4 MG tablet  . vitamin C (ASCORBIC ACID) 500 MG tablet  . amoxicillin  (AMOXIL) 500 MG capsule  . denosumab (PROLIA) 60 MG/ML SOSY injection  . hydrochlorothiazide (HYDRODIURIL) 25 MG tablet  . HYDROcodone-acetaminophen (NORCO) 7.5-325 MG tablet  . HYDROcodone-acetaminophen (NORCO) 7.5-325 MG tablet  . nitroGLYCERIN (NITROSTAT) 0.4 MG SL tablet     Maia Plan [facility] Pre-Surgical Testing [phone] 10/19/19  12:20 PM

## 2019-10-20 LAB — NOVEL CORONAVIRUS, NAA (HOSP ORDER, SEND-OUT TO REF LAB; TAT 18-24 HRS): SARS-CoV-2, NAA: NOT DETECTED

## 2019-10-22 ENCOUNTER — Ambulatory Visit (HOSPITAL_BASED_OUTPATIENT_CLINIC_OR_DEPARTMENT_OTHER): Payer: Medicare PPO | Admitting: Physician Assistant

## 2019-10-22 ENCOUNTER — Ambulatory Visit (HOSPITAL_BASED_OUTPATIENT_CLINIC_OR_DEPARTMENT_OTHER)
Admission: RE | Admit: 2019-10-22 | Discharge: 2019-10-22 | Disposition: A | Discharge status: Home / Self Care | Payer: Medicare PPO | Attending: Urology | Admitting: Urology

## 2019-10-22 ENCOUNTER — Encounter (HOSPITAL_BASED_OUTPATIENT_CLINIC_OR_DEPARTMENT_OTHER): Payer: Self-pay | Admitting: Urology

## 2019-10-22 ENCOUNTER — Encounter (HOSPITAL_BASED_OUTPATIENT_CLINIC_OR_DEPARTMENT_OTHER)
Admission: RE | Disposition: A | Discharge status: Home / Self Care | Payer: Self-pay | Source: Home / Self Care | Attending: Urology

## 2019-10-22 ENCOUNTER — Other Ambulatory Visit: Payer: Self-pay

## 2019-10-22 DIAGNOSIS — Z7982 Long term (current) use of aspirin: Secondary | ICD-10-CM | POA: Diagnosis not present

## 2019-10-22 DIAGNOSIS — Z8572 Personal history of non-Hodgkin lymphomas: Secondary | ICD-10-CM | POA: Insufficient documentation

## 2019-10-22 DIAGNOSIS — E890 Postprocedural hypothyroidism: Secondary | ICD-10-CM | POA: Insufficient documentation

## 2019-10-22 DIAGNOSIS — Z87891 Personal history of nicotine dependence: Secondary | ICD-10-CM | POA: Diagnosis not present

## 2019-10-22 DIAGNOSIS — I4891 Unspecified atrial fibrillation: Secondary | ICD-10-CM | POA: Insufficient documentation

## 2019-10-22 DIAGNOSIS — Z87442 Personal history of urinary calculi: Secondary | ICD-10-CM | POA: Diagnosis not present

## 2019-10-22 DIAGNOSIS — N200 Calculus of kidney: Secondary | ICD-10-CM | POA: Diagnosis not present

## 2019-10-22 DIAGNOSIS — D509 Iron deficiency anemia, unspecified: Secondary | ICD-10-CM | POA: Diagnosis not present

## 2019-10-22 DIAGNOSIS — Z79899 Other long term (current) drug therapy: Secondary | ICD-10-CM | POA: Insufficient documentation

## 2019-10-22 DIAGNOSIS — I252 Old myocardial infarction: Secondary | ICD-10-CM | POA: Diagnosis not present

## 2019-10-22 DIAGNOSIS — Z7901 Long term (current) use of anticoagulants: Secondary | ICD-10-CM | POA: Insufficient documentation

## 2019-10-22 DIAGNOSIS — L400 Psoriasis vulgaris: Secondary | ICD-10-CM | POA: Diagnosis not present

## 2019-10-22 DIAGNOSIS — E785 Hyperlipidemia, unspecified: Secondary | ICD-10-CM | POA: Insufficient documentation

## 2019-10-22 DIAGNOSIS — J449 Chronic obstructive pulmonary disease, unspecified: Secondary | ICD-10-CM | POA: Insufficient documentation

## 2019-10-22 DIAGNOSIS — Z79891 Long term (current) use of opiate analgesic: Secondary | ICD-10-CM | POA: Insufficient documentation

## 2019-10-22 DIAGNOSIS — Z96641 Presence of right artificial hip joint: Secondary | ICD-10-CM | POA: Insufficient documentation

## 2019-10-22 DIAGNOSIS — Z96653 Presence of artificial knee joint, bilateral: Secondary | ICD-10-CM | POA: Diagnosis not present

## 2019-10-22 DIAGNOSIS — I110 Hypertensive heart disease with heart failure: Secondary | ICD-10-CM | POA: Insufficient documentation

## 2019-10-22 DIAGNOSIS — F411 Generalized anxiety disorder: Secondary | ICD-10-CM | POA: Diagnosis not present

## 2019-10-22 DIAGNOSIS — I5032 Chronic diastolic (congestive) heart failure: Secondary | ICD-10-CM | POA: Insufficient documentation

## 2019-10-22 DIAGNOSIS — G4733 Obstructive sleep apnea (adult) (pediatric): Secondary | ICD-10-CM | POA: Diagnosis not present

## 2019-10-22 DIAGNOSIS — F329 Major depressive disorder, single episode, unspecified: Secondary | ICD-10-CM | POA: Insufficient documentation

## 2019-10-22 DIAGNOSIS — Z79890 Hormone replacement therapy (postmenopausal): Secondary | ICD-10-CM | POA: Insufficient documentation

## 2019-10-22 DIAGNOSIS — K219 Gastro-esophageal reflux disease without esophagitis: Secondary | ICD-10-CM | POA: Insufficient documentation

## 2019-10-22 DIAGNOSIS — I2510 Atherosclerotic heart disease of native coronary artery without angina pectoris: Secondary | ICD-10-CM | POA: Insufficient documentation

## 2019-10-22 DIAGNOSIS — Z9221 Personal history of antineoplastic chemotherapy: Secondary | ICD-10-CM | POA: Diagnosis not present

## 2019-10-22 HISTORY — DX: Unspecified diastolic (congestive) heart failure: I50.30

## 2019-10-22 HISTORY — DX: Hyperlipidemia, unspecified: E78.5

## 2019-10-22 HISTORY — PX: CYSTOSCOPY WITH RETROGRADE PYELOGRAM, URETEROSCOPY AND STENT PLACEMENT: SHX5789

## 2019-10-22 HISTORY — DX: Gastro-esophageal reflux disease without esophagitis: K21.9

## 2019-10-22 HISTORY — DX: Calculus of kidney: N20.0

## 2019-10-22 HISTORY — DX: Obstructive sleep apnea (adult) (pediatric): G47.33

## 2019-10-22 HISTORY — DX: Diffuse large B-cell lymphoma, unspecified site: C83.30

## 2019-10-22 HISTORY — DX: History of falling: Z91.81

## 2019-10-22 HISTORY — DX: Personal history of (healed) traumatic fracture: Z87.81

## 2019-10-22 HISTORY — DX: Testicular hypofunction: E29.1

## 2019-10-22 HISTORY — DX: Presence of external hearing-aid: Z97.4

## 2019-10-22 HISTORY — DX: Postprocedural hypothyroidism: E89.0

## 2019-10-22 HISTORY — DX: Personal history of other infectious and parasitic diseases: Z86.19

## 2019-10-22 HISTORY — DX: Iron deficiency anemia, unspecified: D50.9

## 2019-10-22 HISTORY — DX: Long term (current) use of anticoagulants: Z79.01

## 2019-10-22 HISTORY — DX: Dysthymic disorder: F34.1

## 2019-10-22 HISTORY — DX: Generalized anxiety disorder: F41.1

## 2019-10-22 HISTORY — DX: Unspecified osteoarthritis, unspecified site: M19.90

## 2019-10-22 HISTORY — DX: Benign prostatic hyperplasia with lower urinary tract symptoms: N40.1

## 2019-10-22 HISTORY — DX: Other persistent atrial fibrillation: I48.19

## 2019-10-22 HISTORY — PX: HOLMIUM LASER APPLICATION: SHX5852

## 2019-10-22 LAB — POCT I-STAT, CHEM 8
BUN: 27 mg/dL — ABNORMAL HIGH (ref 8–23)
Calcium, Ion: 1.15 mmol/L (ref 1.15–1.40)
Chloride: 99 mmol/L (ref 98–111)
Creatinine, Ser: 1.3 mg/dL — ABNORMAL HIGH (ref 0.61–1.24)
Glucose, Bld: 127 mg/dL — ABNORMAL HIGH (ref 70–99)
HCT: 46 % (ref 39.0–52.0)
Hemoglobin: 15.6 g/dL (ref 13.0–17.0)
Potassium: 3.6 mmol/L (ref 3.5–5.1)
Sodium: 138 mmol/L (ref 135–145)
TCO2: 27 mmol/L (ref 22–32)

## 2019-10-22 SURGERY — CYSTOURETEROSCOPY, WITH RETROGRADE PYELOGRAM AND STENT INSERTION
Anesthesia: General | Site: Ureter | Laterality: Right

## 2019-10-22 MED ORDER — DEXAMETHASONE SODIUM PHOSPHATE 10 MG/ML IJ SOLN
INTRAMUSCULAR | Status: DC | PRN
  Administered 2019-10-22: 5 mg via INTRAVENOUS

## 2019-10-22 MED ORDER — ACETAMINOPHEN 500 MG PO TABS
ORAL_TABLET | ORAL | Status: AC
  Filled 2019-10-22: qty 2

## 2019-10-22 MED ORDER — ONDANSETRON HCL 4 MG/2ML IJ SOLN
INTRAMUSCULAR | Status: AC
  Filled 2019-10-22: qty 2

## 2019-10-22 MED ORDER — PROPOFOL 10 MG/ML IV BOLUS
INTRAVENOUS | Status: AC
  Filled 2019-10-22: qty 40

## 2019-10-22 MED ORDER — PROPOFOL 10 MG/ML IV BOLUS
INTRAVENOUS | Status: DC | PRN
  Administered 2019-10-22: 200 mg via INTRAVENOUS

## 2019-10-22 MED ORDER — ONDANSETRON HCL 4 MG/2ML IJ SOLN
INTRAMUSCULAR | Status: DC | PRN
  Administered 2019-10-22: 4 mg via INTRAVENOUS

## 2019-10-22 MED ORDER — FENTANYL CITRATE (PF) 100 MCG/2ML IJ SOLN
INTRAMUSCULAR | Status: DC | PRN
  Administered 2019-10-22: 25 ug via INTRAVENOUS
  Administered 2019-10-22: 50 ug via INTRAVENOUS
  Administered 2019-10-22: 25 ug via INTRAVENOUS

## 2019-10-22 MED ORDER — SODIUM CHLORIDE 0.9 % IV SOLN
INTRAVENOUS | Status: DC
  Filled 2019-10-22: qty 1000

## 2019-10-22 MED ORDER — PROPOFOL 10 MG/ML IV BOLUS
INTRAVENOUS | Status: AC
  Filled 2019-10-22: qty 20

## 2019-10-22 MED ORDER — CEFUROXIME AXETIL 500 MG PO TABS: 500.0000 mg | ORAL_TABLET | Freq: Two times a day (BID) | ORAL | 0 refills | Status: DC

## 2019-10-22 MED ORDER — SCOPOLAMINE 1 MG/3DAYS TD PT72
MEDICATED_PATCH | TRANSDERMAL | Status: AC
  Filled 2019-10-22: qty 1

## 2019-10-22 MED ORDER — FENTANYL CITRATE (PF) 100 MCG/2ML IJ SOLN
25.0000 ug | INTRAMUSCULAR | Status: DC | PRN
  Administered 2019-10-22: 50 ug via INTRAVENOUS
  Filled 2019-10-22: qty 1

## 2019-10-22 MED ORDER — LIDOCAINE 2% (20 MG/ML) 5 ML SYRINGE
INTRAMUSCULAR | Status: AC
  Filled 2019-10-22: qty 5

## 2019-10-22 MED ORDER — SODIUM CHLORIDE 0.9 % IV SOLN
2.0000 g | INTRAVENOUS | Status: AC
  Administered 2019-10-22: 11:00:00 2 g via INTRAVENOUS
  Filled 2019-10-22: qty 20

## 2019-10-22 MED ORDER — ARTIFICIAL TEARS OPHTHALMIC OINT
TOPICAL_OINTMENT | OPHTHALMIC | Status: AC
  Filled 2019-10-22: qty 3.5

## 2019-10-22 MED ORDER — IOHEXOL 300 MG/ML  SOLN
INTRAMUSCULAR | Status: DC | PRN
  Administered 2019-10-22: 11:00:00 10 mL via URETHRAL

## 2019-10-22 MED ORDER — LIDOCAINE 2% (20 MG/ML) 5 ML SYRINGE
INTRAMUSCULAR | Status: DC | PRN
  Administered 2019-10-22: 60 mg via INTRAVENOUS

## 2019-10-22 MED ORDER — ACETAMINOPHEN 500 MG PO TABS
1000.0000 mg | ORAL_TABLET | Freq: Once | ORAL | Status: AC
  Administered 2019-10-22: 11:00:00 1000 mg via ORAL
  Filled 2019-10-22: qty 2

## 2019-10-22 MED ORDER — PHENYLEPHRINE 40 MCG/ML (10ML) SYRINGE FOR IV PUSH (FOR BLOOD PRESSURE SUPPORT)
PREFILLED_SYRINGE | INTRAVENOUS | Status: AC
  Filled 2019-10-22: qty 20

## 2019-10-22 MED ORDER — KETOROLAC TROMETHAMINE 30 MG/ML IJ SOLN
INTRAMUSCULAR | Status: AC
  Filled 2019-10-22: qty 1

## 2019-10-22 MED ORDER — HYDROCODONE-ACETAMINOPHEN 7.5-325 MG PO TABS: 1.0000 | ORAL_TABLET | Freq: Four times a day (QID) | ORAL | 0 refills | Status: DC | PRN

## 2019-10-22 MED ORDER — CEFTRIAXONE SODIUM 2 G IJ SOLR
INTRAMUSCULAR | Status: AC
  Filled 2019-10-22: qty 20

## 2019-10-22 MED ORDER — DEXAMETHASONE SODIUM PHOSPHATE 10 MG/ML IJ SOLN
INTRAMUSCULAR | Status: AC
  Filled 2019-10-22: qty 1

## 2019-10-22 MED ORDER — MIDAZOLAM HCL 2 MG/2ML IJ SOLN
INTRAMUSCULAR | Status: AC
  Filled 2019-10-22: qty 2

## 2019-10-22 MED ORDER — LACTATED RINGERS IV SOLN
INTRAVENOUS | Status: DC
  Filled 2019-10-22: qty 1000

## 2019-10-22 MED ORDER — FENTANYL CITRATE (PF) 100 MCG/2ML IJ SOLN
INTRAMUSCULAR | Status: AC
  Filled 2019-10-22: qty 4

## 2019-10-22 MED ORDER — PHENYLEPHRINE HCL (PRESSORS) 10 MG/ML IV SOLN
INTRAVENOUS | Status: DC | PRN
  Administered 2019-10-22 (×2): 120 ug via INTRAVENOUS
  Administered 2019-10-22: 80 ug via INTRAVENOUS
  Administered 2019-10-22: 120 ug via INTRAVENOUS
  Administered 2019-10-22: 80 ug via INTRAVENOUS

## 2019-10-22 MED ORDER — SODIUM CHLORIDE 0.9 % IV SOLN
INTRAVENOUS | Status: AC
  Filled 2019-10-22: qty 100

## 2019-10-22 MED ORDER — FENTANYL CITRATE (PF) 100 MCG/2ML IJ SOLN
INTRAMUSCULAR | Status: AC
  Filled 2019-10-22: qty 2

## 2019-10-22 SURGICAL SUPPLY — 33 items
BAG DRAIN URO-CYSTO SKYTR STRL (DRAIN) ×3 IMPLANT
BAG DRN UROCATH (DRAIN) ×1
BASKET STONE 1.7 NGAGE (UROLOGICAL SUPPLIES) ×2 IMPLANT
CATH INTERMIT  6FR 70CM (CATHETERS) ×2 IMPLANT
CLOTH BEACON ORANGE TIMEOUT ST (SAFETY) ×3 IMPLANT
DRSG TEGADERM 2-3/8X2-3/4 SM (GAUZE/BANDAGES/DRESSINGS) ×2 IMPLANT
FIBER LASER TRAC TIP (UROLOGICAL SUPPLIES) ×2 IMPLANT
GLOVE BIO SURGEON STRL SZ 6.5 (GLOVE) ×1 IMPLANT
GLOVE BIO SURGEON STRL SZ8 (GLOVE) ×3 IMPLANT
GLOVE BIO SURGEONS STRL SZ 6.5 (GLOVE) ×1
GLOVE BIOGEL PI IND STRL 6.5 (GLOVE) IMPLANT
GLOVE BIOGEL PI IND STRL 8.5 (GLOVE) IMPLANT
GLOVE BIOGEL PI INDICATOR 6.5 (GLOVE) ×2
GLOVE BIOGEL PI INDICATOR 8.5 (GLOVE) ×2
GLOVE SURG SS PI 8.5 STRL IVOR (GLOVE) ×4
GLOVE SURG SS PI 8.5 STRL STRW (GLOVE) IMPLANT
GOWN STRL REUS W/TWL LRG LVL3 (GOWN DISPOSABLE) ×2 IMPLANT
GOWN STRL REUS W/TWL XL LVL3 (GOWN DISPOSABLE) ×7 IMPLANT
GUIDEWIRE STR DUAL SENSOR (WIRE) IMPLANT
GUIDEWIRE ZIPWRE .038 STRAIGHT (WIRE) ×3 IMPLANT
IV NS 1000ML (IV SOLUTION)
IV NS 1000ML BAXH (IV SOLUTION) ×1 IMPLANT
IV NS IRRIG 3000ML ARTHROMATIC (IV SOLUTION) ×3 IMPLANT
KIT TURNOVER CYSTO (KITS) ×3 IMPLANT
MANIFOLD NEPTUNE II (INSTRUMENTS) ×3 IMPLANT
NS IRRIG 500ML POUR BTL (IV SOLUTION) ×3 IMPLANT
PACK CYSTO (CUSTOM PROCEDURE TRAY) ×3 IMPLANT
SHEATH URET ACCESS 12FR/35CM (UROLOGICAL SUPPLIES) ×2 IMPLANT
STENT URET 6FRX26 CONTOUR (STENTS) ×2 IMPLANT
SYR 10ML LL (SYRINGE) ×3 IMPLANT
TUBE CONNECTING 12'X1/4 (SUCTIONS) ×1
TUBE CONNECTING 12X1/4 (SUCTIONS) ×1 IMPLANT
TUBING UROLOGY SET (TUBING) ×3 IMPLANT

## 2019-10-22 NOTE — Anesthesia Postprocedure Evaluation (Signed)
Anesthesia Post Note  Patient: [name]  Procedure(s) Performed: CYSTOSCOPY WITH RETROGRADE PYELOGRAM, URETEROSCOPY AND STENT PLACEMENT (Right Ureter) HOLMIUM LASER APPLICATION (Right Ureter)     Patient location during evaluation: PACU Anesthesia Type: General Level of consciousness: awake and alert and oriented Pain management: pain level controlled Vital Signs Assessment: post-procedure vital signs reviewed and stable Respiratory status: spontaneous breathing, nonlabored ventilation and respiratory function stable Cardiovascular status: blood pressure returned to baseline and stable Postop Assessment: no apparent nausea or vomiting Anesthetic complications: no    Last Vitals:  Vitals:   10/22/19 1315 10/22/19 1421  BP: 123/61 122/71  Pulse: 80 80  Resp: 16 14  Temp:    SpO2: 94% 95%    Last Pain:  Vitals:   10/22/19 1421  TempSrc:   PainSc: 2                  Joseph Quintela A.

## 2019-10-22 NOTE — Op Note (Signed)
Preoperative diagnosis: Right renal stone  Postoperative diagnosis: Same  Procedure: 1 cystoscopy 2 right retrograde pyelography 3.  Intraoperative fluoroscopy, under one hour, with interpretation 4.  Right ureteroscopic stone manipulation with laser lithotripsy 5.  Right 6 x 26 JJ stent placement  Attending: Rosie Fate  Anesthesia: General  Estimated blood loss: None  Drains: Right 6 x 26 JJ ureteral stent with tether  Specimens: stone for analysis  Antibiotics: ancef  Findings: Right lower polestone. No hydronephrosis. No masses/lesions in the bladder. Ureteral orifices in normal anatomic location.  Indications: Patient is a 74 year old male with a history of right renal stone and who has persistent right flank pain.  After discussing treatment options, he decided proceed with right ureteroscopic stone manipulation.  Procedure her in detail: The patient was brought to the operating room and a brief timeout was done to ensure correct patient, correct procedure, correct site.  General anesthesia was administered patient was placed in dorsal lithotomy position.  Her genitalia was then prepped and draped in usual sterile fashion.  A rigid 47 French cystoscope was passed in the urethra and the bladder.  Bladder was inspected free masses or lesions.  the right ureteral orifices were in the normal orthotopic locations.  a 6 french ureteral catheter was then instilled into the right ureter orifice.  a gentle retrograde was obtained and findings noted above.  we then placed a zip wire through the ureteral catheter and advanced up to the renal pelvis.  we then removed the cystoscope and cannulated the right ureteral orifice with a semirigid ureteroscope.  No stone was found in the ureter. Once we reached the UPJ a sensor wire was advanced in to the renal pelvis. We then removed the ureteroscope and advanced a  12/14 x35cm access sheath up to the renal pelvis. We used the flexible  ureteroscope t perform nephroscopy. We encountered stones in the lower.  using a 200 nm laser fiber and fragmented the stone into smaller pieces.  the pieces were then removed with a engage basket.once all stone fragments were removed we then removed the access sheath under direct vision and noted no injury to the ureter. We then placed a 6 x 26 double-j ureteral stent over the original zip wire.    We then removed the wire and good coil was noted in the the renal pelvis under fluoroscopy and the bladder under direct vision.      the bladder was then drained and this concluded the procedure which was well tolerated by patient.  Complications: None  Condition: Stable, extubated, transferred to PACU  Plan: Patient is to be discharged home as to follow-up in one week. He s to remove his stent by pulling the string in 3 days

## 2019-10-22 NOTE — Anesthesia Procedure Notes (Signed)
Procedure Name: LMA Insertion Date/Time: 10/22/2019 11:25 AM Performed by: Wanita Chamberlain, CRNA Pre-anesthesia Checklist: Patient identified, Timeout performed, Emergency Drugs available, Suction available and Patient being monitored Patient Re-evaluated:Patient Re-evaluated prior to induction Oxygen Delivery Method: Circle system utilized Preoxygenation: Pre-oxygenation with 100% oxygen Induction Type: IV induction Ventilation: Mask ventilation without difficulty LMA: LMA inserted LMA Size: 5.0 Number of attempts: 1 Placement Confirmation: breath sounds checked- equal and bilateral and CO2 detector Tube secured with: Tape Dental Injury: Teeth and Oropharynx as per pre-operative assessment

## 2019-10-22 NOTE — H&P (Signed)
Urology Admission H&P  Chief Complaint: right renal calculus  History of Present Illness: Mr [name] is a 74yo with a hx of recurrent nephrolithiasis who dveloped a 1cm right renal calculus. He has intermittent right flank pain. No LUTS. No hematuria  Past Medical History:  Diagnosis Date  . Anticoagulant long-term use    xarelto--- takes for AFib,  managed by pcp, Dr Alain Marion  . Benign localized prostatic hyperplasia with lower urinary tract symptoms (LUTS)   . CAD (coronary artery disease) cardiologist--  dr t. Oval Linsey   hx of known CAD obstruction w/ collaterals (cath done @ [facility] 12-31-2011) ;   last cardiac cath 08-13-2019  showed sig. 2V CAD involving proxLAD and CTO of the RCA/  chronic total occlusion midRCA w/ bridging and L>R collaterals  . Diastolic CHF ([place])    dx [id number] hospital admission (followed by pcp)  . Diffuse large B cell lymphoma [facility]) oncologist-- dr Irene Limbo--- in remission   dx 08/ 2019 -- bx left tonsill mass-- involving lymph nodes--- completed chemotherapy 10-12-2018  . DJD (degenerative joint disease)   . Dysthymic disorder   . Elevated brain natriuretic peptide (BNP) level   . Environmental allergies   . GAD (generalized anxiety disorder)   . GERD (gastroesophageal reflux disease)   . History of falling    multiple times  . History of Graves' disease    1987 s/p total thyroidectomy  . History of kidney stones   . History of scabies    07/ 2014  resolved  . History of syncope    per D/C note in epic 02-21-2013 ?vasovagal  . History of vertebral compression fracture    05/ 2014 --  L1, no surgical intervention  . HLD (hyperlipidemia)   . Hyperlipidemia   . Hypertension   . Hypogonadism in male   . IDA (iron deficiency anemia)   . Lumbago   . OA (osteoarthritis)   . OSA on CPAP    cpap machine-settings 17  . Osteoporosis    Severe  . Persistent atrial fibrillation [facility])    cardiologist--- dr r. Oval Linsey  . Plaque psoriasis     dermatologist--- dr Harriett Sine--- currently taking otezla  . Post-surgical hypothyroidism    followed by pcp---  s/p total thyroidectomy for graves disease in 1987  . Renal calculus, right   . Thrombocytopenia ([place])   . Unsteady gait    uses walker  . Wears hearing aid in both ears    Past Surgical History:  Procedure Laterality Date  . APPENDECTOMY  2011 approx.   [name] CARDIAC CATHETERIZATION  12-31-2011   dr j. Beatrix Fetters @[facility]    normal LVF w/ multivessel CAD,  occluded RCA w/ colleterals  . CATARACT EXTRACTION W/ INTRAOCULAR LENS  IMPLANT, BILATERAL  2016 approx.  . COLONOSCOPY    . CYSTOSCOPY WITH RETROGRADE PYELOGRAM, URETEROSCOPY AND STENT PLACEMENT Left 11/07/2017   Procedure: CYSTOSCOPY WITH RETROGRADE PYELOGRAM, URETEROSCOPY AND STENT PLACEMENT;  Surgeon: Cleon Gustin, MD;  Location: [facility];  Service: Urology;  Laterality: Left;  . CYSTOSCOPY/URETEROSCOPY/HOLMIUM LASER/STENT PLACEMENT Right 03/11/2017   Procedure: CYSTOSCOPY/URETEROSCOPYSTENT PLACEMENT right ureter retrograde pylegram;  Surgeon: Cleon Gustin, MD;  Location: [facility];  Service: Urology;  Laterality: Right;  . HARDWARE REMOVAL  10/05/2012   Procedure: HARDWARE REMOVAL;  Surgeon: [name], MD;  [facility];  Service: Orthopedics;  Laterality: Right;  REMOVING  STRYKER  GAMMA NAIL  . HARDWARE REMOVAL Right 07/03/2013   Procedure: HARDWARE REMOVAL RIGHT TIBIA ;  Surgeon: Rozanna Box, MD;  Location: [place];  Service: Orthopedics;  Laterality: Right;  . HIP CLOSED REDUCTION Right 10/15/2013   Procedure: CLOSED MANIPULATION HIP;  Surgeon: [name], MD;  [facility];  Service: Orthopedics;  Laterality: Right;  . HOLMIUM LASER APPLICATION Right [id number]   Procedure: HOLMIUM LASER APPLICATION;  Surgeon: Cleon Gustin, MD;  [facility];  Service: Urology;  Laterality: Right;  . HOLMIUM LASER APPLICATION Left [id number]   Procedure: HOLMIUM LASER APPLICATION;  Surgeon: Cleon Gustin, MD;  [facility];  Service: Urology;  Laterality: Left;  . HOLMIUM LASER APPLICATION Left [id number]   Procedure: HOLMIUM LASER APPLICATION;  Surgeon: Cleon Gustin, MD;  [facility];  Service: Urology;  Laterality: Left;  . INCISION AND DRAINAGE HIP Right 11/16/2013   Procedure: IRRIGATION AND DEBRIDEMENT RIGHT HIP;  Surgeon: [name], MD;  [facility];  Service: Orthopedics;  Laterality: Right;  . INTRAVASCULAR PRESSURE WIRE/FFR STUDY N/A 08/13/2019   Procedure: INTRAVASCULAR PRESSURE WIRE/FFR STUDY;  Surgeon: Nelva Bush, MD;  Location: [place] [facility];  Service: Cardiovascular;  Laterality: N/A;  . IR IMAGING GUIDED PORT INSERTION  06/22/2018  . IR NEPHROSTOMY PLACEMENT LEFT  03/28/2019  . IR URETERAL STENT LEFT NEW ACCESS W/O SEP NEPHROSTOMY CATH  10/24/2017  . IR URETERAL STENT LEFT NEW ACCESS W/O SEP NEPHROSTOMY CATH  03/26/2019  . NEPHROLITHOTOMY Right 02/08/2017   Procedure: NEPHROLITHOTOMY PERCUTANEOUS WITH SURGEON ACCESS;  Surgeon: Cleon Gustin, MD;  [facility];  Service: Urology;  Laterality: Right;  . NEPHROLITHOTOMY Left 10/24/2017   Procedure: NEPHROLITHOTOMY PERCUTANEOUS;  Surgeon: Cleon Gustin, MD;  [facility];  Service: Urology;  Laterality: Left;  . NEPHROLITHOTOMY Left 03/26/2019   Procedure: NEPHROLITHOTOMY PERCUTANEOUS;  Surgeon: Cleon Gustin, MD;  [facility];  Service: Urology;  Laterality: Left;  2 HRS  . NEPHROLITHOTOMY Left 04/17/2019   Procedure: NEPHROLITHOTOMY PERCUTANEOUS;  Surgeon: Cleon Gustin, MD;  [facility];  Service: Urology;  Laterality: Left;  2 HRS  . ORIF TIBIA FRACTURE Right 02/06/2013   Procedure: OPEN REDUCTION INTERNAL FIXATION (ORIF) TIBIA FRACTURE WITH IM ROD FIBULA;  Surgeon: Rozanna Box, MD;  Location: [place];  Service: Orthopedics;  Laterality: Right;  . ORIF TIBIA FRACTURE Right 07/03/2013   Procedure: RIGHT TIBIA NON UNION REPAIR ;  Surgeon: Rozanna Box, MD;   Location: [place];  Service: Orthopedics;  Laterality: Right;  . ORIF WRIST FRACTURE  10/02/2012   Procedure: OPEN REDUCTION INTERNAL FIXATION (ORIF) WRIST FRACTURE;  Surgeon: Roseanne Kaufman, MD;  [facility];  Service: Orthopedics;  Laterality: Right;  WITH   ANTIBIOTIC  CEMENT  . ORIF WRIST FRACTURE Left 10/28/2013   Procedure: OPEN REDUCTION INTERNAL FIXATION (ORIF) WRIST FRACTURE with allograft;  Surgeon: Roseanne Kaufman, MD;  [facility];  Service: Orthopedics;  Laterality: Left;  DVR Plate  . QUADRICEPS TENDON REPAIR Left 07/15/2017   Procedure: REPAIR QUADRICEP TENDON;  Surgeon: Frederik Pear, MD;  Location: [place];  Service: Orthopedics;  Laterality: Left;  . RIGHT/LEFT HEART CATH AND CORONARY ANGIOGRAPHY N/A 08/13/2019   Procedure: RIGHT/LEFT HEART CATH AND CORONARY ANGIOGRAPHY;  Surgeon: Nelva Bush, MD;  Location: [place] [facility];  Service: Cardiovascular;  Laterality: N/A;  . THYROIDECTOMY  02/1986  . TOTAL HIP ARTHROPLASTY Right 03-16-2016   @[facility]   . TOTAL HIP REVISION  10/05/2012   Procedure: TOTAL HIP REVISION;  Surgeon: [name], MD;  [facility];  Service: Orthopedics;  Laterality: Right;  RIGHT TOTAL HIP REVISION  . TOTAL HIP REVISION Right 09/17/2013   Procedure: REVISION RIGHT TOTAL HIP ARTHROPLASTY ;  Surgeon: [name], MD;  [facility];  Service: Orthopedics;  Laterality: Right;  . TOTAL HIP REVISION Right 10/26/2013   Procedure: REVISION RIGHT TOTAL HIP ARTHROPLASTY;  Surgeon: [name], MD;  [facility];  Service: Orthopedics;  Laterality: Right;  . TOTAL KNEE ARTHROPLASTY Bilateral right 03-15-2011;  left 06-30-2011  . TOTAL KNEE REVISION Left 04/11/2017   Procedure: TOTAL KNEE REVISION PATELLA and TIBIA;  Surgeon: Frederik Pear, MD;  Location: [place];  Service: Orthopedics;  Laterality: Left;    Home Medications:  Current Facility-Administered Medications  Medication Dose Route Frequency Provider Last Rate Last Admin  . 0.9 %   sodium chloride infusion   Intravenous Continuous Freddrick March, MD 125 mL/hr at 10/22/19 0950 New Bag at 10/22/19 0950  . cefTRIAXone (ROCEPHIN) 2 g in sodium chloride 0.9 % 100 mL IVPB  2 g Intravenous 30 min Pre-Op Cagney Degrace, Candee Furbish, MD       Allergies:  Allergies  Allergen Reactions  . Short Ragweed Pollen Ext Cough    Family History  Problem Relation Age of Onset  . CAD Father 54  . Asthma Father   . Alcohol abuse Father   . Arthritis Mother   . Alcohol abuse Sister    Social History:  reports that he quit smoking about 7 years ago. His smoking use included cigarettes. He has a 50.00 pack-year smoking history. He has never used smokeless tobacco. He reports previous alcohol use. He reports that he does not use drugs.  Review of Systems  All other systems reviewed and are negative.   Physical Exam:  Vital signs in last 24 hours: Temp:  [97.6 F (36.4 C)] 97.6 F (36.4 C) (01/11 0918) Pulse Rate:  [79] 79 (01/11 0918) Resp:  [18] 18 (01/11 0918) BP: (152)/(87) 152/87 (01/11 0918) SpO2:  [97 %] 97 % (01/11 0918) Weight:  [129.2 kg] 129.2 kg (01/11 0930) Physical Exam  Constitutional: He is oriented to person, place, and time. He appears well-developed and well-nourished.  HENT:  Head: Normocephalic and atraumatic.  Eyes: Pupils are equal, round, and reactive to light. EOM are normal.  Neck: No thyromegaly present.  Cardiovascular: Normal rate and regular rhythm.  Respiratory: Effort normal. No respiratory distress.  GI: Soft. He exhibits no distension.  Musculoskeletal:        General: No edema. Normal range of motion.     Cervical back: Normal range of motion.  Neurological: He is alert and oriented to person, place, and time.  Skin: Skin is warm and dry.  Psychiatric: He has a normal mood and affect. His behavior is normal. Judgment and thought content normal.    Laboratory Data:  Results for orders placed or performed during the hospital encounter of  10/22/19 (from the past 24 hour(s))  I-STAT, chem 8     Status: Abnormal   Collection Time: 10/22/19  9:54 AM  Result Value Ref Range   Sodium 138 135 - 145 mmol/L   Potassium 3.6 3.5 - 5.1 mmol/L   Chloride 99 98 - 111 mmol/L   BUN 27 (H) 8 - 23 mg/dL   Creatinine, Ser 1.30 (H) 0.61 - 1.24 mg/dL   Glucose, Bld 127 (H) 70 - 99 mg/dL   Calcium, Ion 1.15 1.15 - 1.40 mmol/L   TCO2 27 22 -  32 mmol/L   Hemoglobin 15.6 13.0 - 17.0 g/dL   HCT 46.0 39.0 - 52.0 %   Recent Results (from the past 240 hour(s))  Novel Coronavirus, NAA (Hosp order, Send-out to Ref Lab; TAT 18-24 hrs     Status: None   Collection Time: 10/18/19 10:33 AM   Specimen: Nasopharyngeal Swab; Respiratory  Result Value Ref Range Status   SARS-CoV-2, NAA NOT DETECTED NOT DETECTED Final    Comment: (NOTE) This nucleic acid amplification test was developed and its performance characteristics determined by [employer]. Nucleic acid amplification tests include PCR and TMA. This test has not been FDA cleared or approved. This test has been authorized by FDA under an Emergency Use Authorization (EUA). This test is only authorized for the duration of time the declaration that circumstances exist justifying the authorization of the emergency use of in vitro diagnostic tests for detection of SARS-CoV-2 virus and/or diagnosis of COVID-19 infection under section 564(b)(1) of the Act, 21 U.S.C. [id number]) (1), unless the authorization is terminated or revoked sooner. When diagnostic testing is negative, the possibility of a false negative result should be considered in the context of a patient's recent exposures and the presence of clinical signs and symptoms consistent with COVID-19. An individual without symptoms of COVID- 19 and who is not shedding SARS-CoV-2 vi rus would expect to have a negative (not detected) result in this assay. Performed At: [facility] [address] [place], [place]  [id number] Rush Farmer MD [id number]    [place]  Final    Comment: Performed at [facility], [place] [address], [place], [place] [address]   Creatinine: Recent Labs    10/22/19 0954  CREATININE 1.30*   Baseline Creatinine: 1.3  Impression/Assessment:  73yo with right renal calculus  Plan:  The risks/benefits/alterantives to right URS was explained to the patient and he understands and wishes to proceed with surgery  [name] 10/22/2019, 10:58 AM

## 2019-10-22 NOTE — Discharge Instructions (Signed)
Ureteral Stent Implantation, Care After This sheet gives you information about how to care for yourself after your procedure. Your health care provider may also give you more specific instructions. If you have problems or questions, contact your health care provider. What can I expect after the procedure? After the procedure, it is common to have:  Nausea.  Mild pain when you urinate. You may feel this pain in your lower back or lower abdomen. The pain should stop within a few minutes after you urinate. This may last for up to 1 week.  A small amount of blood in your urine for several days. Follow these instructions at home: Medicines  Take over-the-counter and prescription medicines only as told by your health care provider.  If you were prescribed an antibiotic medicine, take it as told by your health care provider. Do not stop taking the antibiotic even if you start to feel better.  Do not drive for 24 hours if you were given a sedative during your procedure.  Ask your health care provider if the medicine prescribed to you requires you to avoid driving or using heavy machinery. Activity  Rest as told by your health care provider.  Avoid sitting for a long time without moving. Get up to take short walks every 1-2 hours. This is important to improve blood flow and breathing. Ask for help if you feel weak or unsteady.  Return to your normal activities as told by your health care provider. Ask your health care provider what activities are safe for you. General instructions   Watch for any blood in your urine. Call your health care provider if the amount of blood in your urine increases.  If you have a catheter: ? Follow instructions from your health care provider about taking care of your catheter and collection bag. ? Do not take baths, swim, or use a hot tub until your health care provider approves. Ask your health care provider if you may take showers. You may only be allowed to  take sponge baths.  Drink enough fluid to keep your urine pale yellow.  Do not use any products that contain nicotine or tobacco, such as cigarettes, e-cigarettes, and chewing tobacco. These can delay healing after surgery. If you need help quitting, ask your health care provider.  Keep all follow-up visits as told by your health care provider. This is important. Contact a health care provider if:  You have pain that gets worse or does not get better with medicine, especially pain when you urinate.  You have difficulty urinating.  You feel nauseous or you vomit repeatedly during a period of more than 2 days after the procedure. Get help right away if:  Your urine is dark red or has blood clots in it.  You are leaking urine (have incontinence).  The end of the stent comes out of your urethra.  You cannot urinate.  You have sudden, sharp, or severe pain in your abdomen or lower back.  You have a fever.  You have swelling or pain in your legs.  You have difficulty breathing. Summary  After the procedure, it is common to have mild pain when you urinate that goes away within a few minutes after you urinate. This may last for up to 1 week.  Watch for any blood in your urine. Call your health care provider if the amount of blood in your urine increases.  Take over-the-counter and prescription medicines only as told by your health care provider.  Drink   enough fluid to keep your urine pale yellow. This information is not intended to replace advice given to you by your health care provider. Make sure you discuss any questions you have with your health care provider. Document Revised: 07/04/2018 Document Reviewed: 07/05/2018 Elsevier Patient Education  [place] STENT IN 72 HOURS BY GENTLY PULLING THE STRING  May restart Xarelto today  Post Anesthesia Home Care Instructions  Activity: Get plenty of rest for the remainder of the day. A responsible  individual must stay with you for 24 hours following the procedure.  For the next 24 hours, DO NOT: -Drive a car -Paediatric nurse -Drink alcoholic beverages -Take any medication unless instructed by your physician -Make any legal decisions or sign important papers.  Meals: Start with liquid foods such as gelatin or soup. Progress to regular foods as tolerated. Avoid greasy, spicy, heavy foods. If nausea and/or vomiting occur, drink only clear liquids until the nausea and/or vomiting subsides. Call your physician if vomiting continues.  Special Instructions/Symptoms: Your throat may feel dry or sore from the anesthesia or the breathing tube placed in your throat during surgery. If this causes discomfort, gargle with warm salt water. The discomfort should disappear within 24 hours.  If you had a scopolamine patch placed behind your ear for the management of post- operative nausea and/or vomiting:  1. The medication in the patch is effective for 72 hours, after which it should be removed.  Wrap patch in a tissue and discard in the trash. Wash hands thoroughly with soap and water. 2. You may remove the patch earlier than 72 hours if you experience unpleasant side effects which may include dry mouth, dizziness or visual disturbances. 3. Avoid touching the patch. Wash your hands with soap and water after contact with the patch.

## 2019-10-22 NOTE — Anesthesia Postprocedure Evaluation (Signed)
Anesthesia Post Note  Patient: [name]  Procedure(s) Performed: CYSTOSCOPY WITH RETROGRADE PYELOGRAM, URETEROSCOPY AND STENT PLACEMENT (Right Ureter) HOLMIUM LASER APPLICATION (Right Ureter)     Patient location during evaluation: PACU Anesthesia Type: General Level of consciousness: awake and alert Pain management: pain level controlled Vital Signs Assessment: post-procedure vital signs reviewed and stable Respiratory status: spontaneous breathing, nonlabored ventilation, respiratory function stable and patient connected to nasal cannula oxygen Cardiovascular status: blood pressure returned to baseline and stable Postop Assessment: no apparent nausea or vomiting Anesthetic complications: no    Last Vitals:  Vitals:   10/22/19 1315 10/22/19 1421  BP: 123/61 122/71  Pulse: 80 80  Resp: 16 14  Temp:  36.9 C  SpO2: 94% 95%    Last Pain:  Vitals:   10/22/19 1421  TempSrc:   PainSc: 2                  Kyilee Gregg L Aundraya Dripps

## 2019-10-22 NOTE — Transfer of Care (Addendum)
Immediate Anesthesia Transfer of Care Note  Patient: [name]  Procedure(s) Performed: CYSTOSCOPY WITH RETROGRADE PYELOGRAM, URETEROSCOPY AND STENT PLACEMENT (Right Ureter) HOLMIUM LASER APPLICATION (Right Ureter)  Patient Location: PACU  Anesthesia Type:General  Level of Consciousness: awake, alert , oriented and patient cooperative  Airway & Oxygen Therapy: Patient Spontanous Breathing and Patient connected to nasal cannula oxygen  Post-op Assessment: Report given to RN and Post -op Vital signs reviewed and stable  Post vital signs: Reviewed and stable  Last Vitals:  Vitals Value Taken Time  BP    Temp    Pulse 75 10/22/19 1234  Resp    SpO2 97 % 10/22/19 1234  Vitals shown include unvalidated device data.  Last Pain:  Vitals:   10/22/19 0918  TempSrc: Oral         Complications: No apparent anesthesia complications

## 2019-10-26 ENCOUNTER — Telehealth: Payer: Self-pay

## 2019-10-26 ENCOUNTER — Other Ambulatory Visit: Payer: Self-pay

## 2019-10-26 MED ORDER — METOPROLOL TARTRATE 25 MG PO TABS: 25.0000 mg | ORAL_TABLET | Freq: Two times a day (BID) | ORAL | 2 refills | Status: DC

## 2019-10-26 MED ORDER — EZETIMIBE 10 MG PO TABS: 10.0000 mg | ORAL_TABLET | Freq: Every day | ORAL | 2 refills | Status: DC

## 2019-10-26 MED ORDER — ATORVASTATIN CALCIUM 80 MG PO TABS: 80.0000 mg | ORAL_TABLET | Freq: Every day | ORAL | 2 refills | Status: DC

## 2019-10-26 NOTE — Telephone Encounter (Signed)
Pt states he is using the [facility]. Please advise.

## 2019-10-26 NOTE — Telephone Encounter (Signed)
LMTCB.  Received fax from [facility] about RX refills. Is patient now using this pharmacy?

## 2019-10-30 DIAGNOSIS — N200 Calculus of kidney: Secondary | ICD-10-CM | POA: Diagnosis not present

## 2019-10-30 DIAGNOSIS — N3000 Acute cystitis without hematuria: Secondary | ICD-10-CM | POA: Diagnosis not present

## 2019-10-30 MED ORDER — OMEPRAZOLE 40 MG PO CPDR: DELAYED_RELEASE_CAPSULE | ORAL | 3 refills | Status: DC

## 2019-10-30 MED ORDER — TIZANIDINE HCL 4 MG PO TABS: ORAL_TABLET | ORAL | 1 refills | Status: DC

## 2019-10-30 MED ORDER — RIVAROXABAN 10 MG PO TABS: 10.0000 mg | ORAL_TABLET | Freq: Every day | ORAL | 1 refills | Status: DC

## 2019-10-30 NOTE — Addendum Note (Signed)
Addended by: Karren Cobble on: 10/30/2019 11:40 AM   Modules accepted: Orders

## 2019-10-30 NOTE — Telephone Encounter (Signed)
RXs sent.

## 2019-10-31 DIAGNOSIS — F410 Panic disorder [episodic paroxysmal anxiety] without agoraphobia: Secondary | ICD-10-CM | POA: Diagnosis not present

## 2019-10-31 DIAGNOSIS — F331 Major depressive disorder, recurrent, moderate: Secondary | ICD-10-CM | POA: Diagnosis not present

## 2019-11-01 DIAGNOSIS — I872 Venous insufficiency (chronic) (peripheral): Secondary | ICD-10-CM | POA: Diagnosis not present

## 2019-11-01 DIAGNOSIS — B351 Tinea unguium: Secondary | ICD-10-CM | POA: Diagnosis not present

## 2019-11-03 DIAGNOSIS — N200 Calculus of kidney: Secondary | ICD-10-CM | POA: Diagnosis not present

## 2019-11-05 ENCOUNTER — Inpatient Hospital Stay: Payer: Medicare PPO | Attending: Hematology

## 2019-11-05 ENCOUNTER — Other Ambulatory Visit: Payer: Self-pay

## 2019-11-05 ENCOUNTER — Inpatient Hospital Stay (HOSPITAL_BASED_OUTPATIENT_CLINIC_OR_DEPARTMENT_OTHER): Payer: Medicare PPO | Admitting: Hematology

## 2019-11-05 VITALS — BP 143/85 | HR 93 | Temp 98.7°F | Resp 18 | Ht 76.0 in | Wt 285.2 lb

## 2019-11-05 DIAGNOSIS — C8330 Diffuse large B-cell lymphoma, unspecified site: Secondary | ICD-10-CM

## 2019-11-05 DIAGNOSIS — Z8572 Personal history of non-Hodgkin lymphomas: Secondary | ICD-10-CM | POA: Insufficient documentation

## 2019-11-05 LAB — CBC WITH DIFFERENTIAL/PLATELET
Abs Immature Granulocytes: 0.02 10*3/uL (ref 0.00–0.07)
Basophils Absolute: 0 10*3/uL (ref 0.0–0.1)
Basophils Relative: 1 %
Eosinophils Absolute: 0.1 10*3/uL (ref 0.0–0.5)
Eosinophils Relative: 2 %
HCT: 43.3 % (ref 39.0–52.0)
Hemoglobin: 13.9 g/dL (ref 13.0–17.0)
Immature Granulocytes: 0 %
Lymphocytes Relative: 24 %
Lymphs Abs: 1.5 10*3/uL (ref 0.7–4.0)
MCH: 29 pg (ref 26.0–34.0)
MCHC: 32.1 g/dL (ref 30.0–36.0)
MCV: 90.2 fL (ref 80.0–100.0)
Monocytes Absolute: 0.4 10*3/uL (ref 0.1–1.0)
Monocytes Relative: 6 %
Neutro Abs: 4 10*3/uL (ref 1.7–7.7)
Neutrophils Relative %: 67 %
Platelets: 198 10*3/uL (ref 150–400)
RBC: 4.8 MIL/uL (ref 4.22–5.81)
RDW: 15.6 % — ABNORMAL HIGH (ref 11.5–15.5)
WBC: 6 10*3/uL (ref 4.0–10.5)
nRBC: 0 % (ref 0.0–0.2)

## 2019-11-05 LAB — CMP (CANCER CENTER ONLY)
ALT: 30 U/L (ref 0–44)
AST: 22 U/L (ref 15–41)
Albumin: 3 g/dL — ABNORMAL LOW (ref 3.5–5.0)
Alkaline Phosphatase: 82 U/L (ref 38–126)
Anion gap: 9 (ref 5–15)
BUN: 22 mg/dL (ref 8–23)
CO2: 30 mmol/L (ref 22–32)
Calcium: 9.3 mg/dL (ref 8.9–10.3)
Chloride: 101 mmol/L (ref 98–111)
Creatinine: 1.14 mg/dL (ref 0.61–1.24)
GFR, Est AFR Am: >60 mL/min (ref >60)
GFR, Estimated: >60 mL/min (ref >60)
Glucose, Bld: 123 mg/dL — ABNORMAL HIGH (ref 70–99)
Potassium: 4.1 mmol/L (ref 3.5–5.1)
Sodium: 140 mmol/L (ref 135–145)
Total Bilirubin: 0.6 mg/dL (ref 0.3–1.2)
Total Protein: 7 g/dL (ref 6.5–8.1)

## 2019-11-05 LAB — LACTATE DEHYDROGENASE: LDH: 145 U/L (ref 98–192)

## 2019-11-05 NOTE — Progress Notes (Signed)
HEMATOLOGY/ONCOLOGY CLINIC NOTE  Date of Service: 11/05/2019  Patient Care Team: Plotnikov, Evie Lacks, MD as PCP - General (Internal Medicine) Skeet Latch, MD as PCP - Cardiology (Cardiology) Brunetta Genera, MD as Consulting Physician (Hematology) McKenzie, Candee Furbish, MD as Consulting Physician (Urology) Jackquline Denmark, MD as Consulting Physician (Gastroenterology) Skeet Latch, MD as Attending Physician (Cardiology)  CHIEF COMPLAINTS/PURPOSE OF CONSULTATION:  Follow Up for Diffuse Large B-Cell Lymphoma   HISTORY OF PRESENTING ILLNESS:   [name] is a wonderful 74 y.o. male who has been referred to [place] by Dr. Lew Dawes for evaluation and management of Diffuse Large B-Cell Lymphoma. He is accompanied today by his partner. The pt reports that he is doing well overall.   The pt reports that he presented to ENT Dr. Pollie Friar care after developing a cough and a new nodule in his mouth. He notes that the symptoms presented about a month ago and have not progressed or significantly worsened. He notes that he has frequent sinus drainage, and felt that this enlargement could initially be related to this. He denies any changes in his voice and difficulty breathing.   The pt notes that he is finishing three months of PT tomorrow after surgeries on his right leg and left knee. He notes that he takes blood thinners for Afib and uses his CPAP regularly now. The pt also receives testosterone injections, 200mg  every other week. The pt notes that he had several surgeries bilaterally to remove large kidney stones in the past. He notes that his thyroid replacement has been stable. His vertebral compression fracture was in 2013, after a fall in which he also broke his right femur and wrist. The pt also takes Prolia every 6 months for his osteoporosis. He has had 18 surgeries on his right leg.   He notes that his psoriasis has been stable and takes Enbrel twice a week, and has  taken this for 5 years. He was on 10 years of Humara before this. He was on Methotrexate prior to [facility].   The pt notes that he functions okay at home and is able to drive himself and cook for himself and feels that he can function independently with limitations on lifting heavier objects.   On review of systems, pt reports recent cough, left tonsil mass, stable energy levels, and denies fevers, chills, night sweats, unexpected weight loss, difficulty breathing, difficulty swallowing, noticing any other lumps or bumps, pain along the spine, recent infections, abdominal pains, difficulty urinating, leg swelling, and any other symptoms.  . On Social Hx the pt reports that he smoked cigarettes for many years until he quit 6 years ago, and drinks ETOH socially.    INTERVAL HISTORY  [name] is here for management and evaluation, after completing 6 planned cycles of R-CHOP treatmenfollowup and active surveillance of his Diffuse Large B-Cell Lymphoma. The patient's last visit with [place] was on 08/03/2019. The pt reports that he is doing well overall.  The pt reports that he had to have a kidney stone removed less than a month ago. He has had a couple of falls over the last three months. He has continued to work on improving his walking abilities. Pt's PT was discontinued due to [place] but he plans on returning as soon as possible and going to the gym when it opens up. Pt has been trying to get the COVID19 vaccine but has found getting an appointment difficult. He has had several psoriasis flare ups and  his rash is currently confined to his elbows. He has an upcoming appointment with his Dermatologist on Monday.  He was diagnosed with CHF in the ED and was placed on a diuretic. He as seen his Cardiologist, Dr. Skeet Latch since this incident. His breathing has improved since being placed on a diuretic.   Lab results today (11/05/19) of CBC w/diff and CMP is as follows: all values are WNL except  for RDW at 15.6, Glucose at 123, Albumin at 3.0. 11/05/2019 LDH at 145  On review of systems, pt reports skin rash and denies chest pain, SOB and any other symptoms.   MEDICAL HISTORY:  Past Medical History:  Diagnosis Date  . Anticoagulant long-term use    xarelto--- takes for AFib,  managed by pcp, Dr Alain Marion  . Benign localized prostatic hyperplasia with lower urinary tract symptoms (LUTS)   . CAD (coronary artery disease) cardiologist--  dr t. Oval Linsey   hx of known CAD obstruction w/ collaterals (cath done @ [facility] 12-31-2011) ;   last cardiac cath 08-13-2019  showed sig. 2V CAD involving proxLAD and CTO of the RCA/  chronic total occlusion midRCA w/ bridging and L>R collaterals  . Diastolic CHF ([place])    dx [id number] hospital admission (followed by pcp)  . Diffuse large B cell lymphoma [facility]) oncologist-- dr Irene Limbo--- in remission   dx 08/ 2019 -- bx left tonsill mass-- involving lymph nodes--- completed chemotherapy 10-12-2018  . DJD (degenerative joint disease)   . Dysthymic disorder   . Elevated brain natriuretic peptide (BNP) level   . Environmental allergies   . GAD (generalized anxiety disorder)   . GERD (gastroesophageal reflux disease)   . History of falling    multiple times  . History of Graves' disease    1987 s/p total thyroidectomy  . History of kidney stones   . History of scabies    07/ 2014  resolved  . History of syncope    per D/C note in epic 02-21-2013 ?vasovagal  . History of vertebral compression fracture    05/ 2014 --  L1, no surgical intervention  . HLD (hyperlipidemia)   . Hyperlipidemia   . Hypertension   . Hypogonadism in male   . IDA (iron deficiency anemia)   . Lumbago   . OA (osteoarthritis)   . OSA on CPAP    cpap machine-settings 17  . Osteoporosis    Severe  . Persistent atrial fibrillation [facility])    cardiologist--- dr r. Oval Linsey  . Plaque psoriasis    dermatologist--- dr Harriett Sine--- currently taking otezla  . Post-surgical  hypothyroidism    followed by pcp---  s/p total thyroidectomy for graves disease in 1987  . Renal calculus, right   . Thrombocytopenia ([place])   . Unsteady gait    uses walker  . Wears hearing aid in both ears     SURGICAL HISTORY: Past Surgical History:  Procedure Laterality Date  . APPENDECTOMY  2011 approx.   [name] CARDIAC CATHETERIZATION  12-31-2011   dr j. Beatrix Fetters @[facility]    normal LVF w/ multivessel CAD,  occluded RCA w/ colleterals  . CATARACT EXTRACTION W/ INTRAOCULAR LENS  IMPLANT, BILATERAL  2016 approx.  . COLONOSCOPY    . CYSTOSCOPY WITH RETROGRADE PYELOGRAM, URETEROSCOPY AND STENT PLACEMENT Left 11/07/2017   Procedure: CYSTOSCOPY WITH RETROGRADE PYELOGRAM, URETEROSCOPY AND STENT PLACEMENT;  Surgeon: Cleon Gustin, MD;  Location: [facility];  Service: Urology;  Laterality: Left;  . CYSTOSCOPY WITH RETROGRADE PYELOGRAM, URETEROSCOPY AND STENT PLACEMENT  Right 10/22/2019   Procedure: CYSTOSCOPY WITH RETROGRADE PYELOGRAM, URETEROSCOPY AND STENT PLACEMENT;  Surgeon: Cleon Gustin, MD;  Location: [facility];  Service: Urology;  Laterality: Right;  . CYSTOSCOPY/URETEROSCOPY/HOLMIUM LASER/STENT PLACEMENT Right 03/11/2017   Procedure: CYSTOSCOPY/URETEROSCOPYSTENT PLACEMENT right ureter retrograde pylegram;  Surgeon: Cleon Gustin, MD;  [facility];  Service: Urology;  Laterality: Right;  . HARDWARE REMOVAL  10/05/2012   Procedure: HARDWARE REMOVAL;  Surgeon: [name], MD;  [facility];  Service: Orthopedics;  Laterality: Right;  REMOVING  STRYKER  GAMMA NAIL  . HARDWARE REMOVAL Right 07/03/2013   Procedure: HARDWARE REMOVAL RIGHT TIBIA ;  Surgeon: Rozanna Box, MD;  Location: [place];  Service: Orthopedics;  Laterality: Right;  . HIP CLOSED REDUCTION Right 10/15/2013   Procedure: CLOSED MANIPULATION HIP;  Surgeon: [name], MD;  [facility];  Service: Orthopedics;  Laterality: Right;  . HOLMIUM LASER APPLICATION Right [id number]   Procedure:  HOLMIUM LASER APPLICATION;  Surgeon: Cleon Gustin, MD;  [facility];  Service: Urology;  Laterality: Right;  . HOLMIUM LASER APPLICATION Left [id number]   Procedure: HOLMIUM LASER APPLICATION;  Surgeon: Cleon Gustin, MD;  [facility];  Service: Urology;  Laterality: Left;  . HOLMIUM LASER APPLICATION Left [id number]   Procedure: HOLMIUM LASER APPLICATION;  Surgeon: Cleon Gustin, MD;  [facility];  Service: Urology;  Laterality: Left;  . HOLMIUM LASER APPLICATION Right [id number]   Procedure: HOLMIUM LASER APPLICATION;  Surgeon: Cleon Gustin, MD;  Location: [facility];  Service: Urology;  Laterality: Right;  . INCISION AND DRAINAGE HIP Right 11/16/2013   Procedure: IRRIGATION AND DEBRIDEMENT RIGHT HIP;  Surgeon: [name], MD;  [facility];  Service: Orthopedics;  Laterality: Right;  . INTRAVASCULAR PRESSURE WIRE/FFR STUDY N/A 08/13/2019   Procedure: INTRAVASCULAR PRESSURE WIRE/FFR STUDY;  Surgeon: Nelva Bush, MD;  Location: [place] [facility];  Service: Cardiovascular;  Laterality: N/A;  . IR IMAGING GUIDED PORT INSERTION  06/22/2018  . IR NEPHROSTOMY PLACEMENT LEFT  03/28/2019  . IR URETERAL STENT LEFT NEW ACCESS W/O SEP NEPHROSTOMY CATH  10/24/2017  . IR URETERAL STENT LEFT NEW ACCESS W/O SEP NEPHROSTOMY CATH  03/26/2019  . NEPHROLITHOTOMY Right 02/08/2017   Procedure: NEPHROLITHOTOMY PERCUTANEOUS WITH SURGEON ACCESS;  Surgeon: Cleon Gustin, MD;  [facility];  Service: Urology;  Laterality: Right;  . NEPHROLITHOTOMY Left 10/24/2017   Procedure: NEPHROLITHOTOMY PERCUTANEOUS;  Surgeon: Cleon Gustin, MD;  [facility];  Service: Urology;  Laterality: Left;  . NEPHROLITHOTOMY Left 03/26/2019   Procedure: NEPHROLITHOTOMY PERCUTANEOUS;  Surgeon: Cleon Gustin, MD;  [facility];  Service: Urology;  Laterality: Left;  2 HRS  . NEPHROLITHOTOMY Left 04/17/2019   Procedure: NEPHROLITHOTOMY PERCUTANEOUS;  Surgeon:  Cleon Gustin, MD;  [facility];  Service: Urology;  Laterality: Left;  2 HRS  . ORIF TIBIA FRACTURE Right 02/06/2013   Procedure: OPEN REDUCTION INTERNAL FIXATION (ORIF) TIBIA FRACTURE WITH IM ROD FIBULA;  Surgeon: Rozanna Box, MD;  Location: [place];  Service: Orthopedics;  Laterality: Right;  . ORIF TIBIA FRACTURE Right 07/03/2013   Procedure: RIGHT TIBIA NON UNION REPAIR ;  Surgeon: Rozanna Box, MD;  Location: [place];  Service: Orthopedics;  Laterality: Right;  . ORIF WRIST FRACTURE  10/02/2012   Procedure: OPEN REDUCTION INTERNAL FIXATION (ORIF) WRIST FRACTURE;  Surgeon: Roseanne Kaufman, MD;  [facility];  Service: Orthopedics;  Laterality: Right;  WITH   ANTIBIOTIC  CEMENT  . ORIF WRIST FRACTURE Left 10/28/2013   Procedure: OPEN REDUCTION INTERNAL FIXATION (ORIF) WRIST FRACTURE with allograft;  Surgeon: Roseanne Kaufman, MD;  [facility];  Service: Orthopedics;  Laterality: Left;  DVR Plate  . QUADRICEPS TENDON REPAIR Left 07/15/2017   Procedure: REPAIR QUADRICEP TENDON;  Surgeon: Frederik Pear, MD;  Location: [place];  Service: Orthopedics;  Laterality: Left;  . RIGHT/LEFT HEART CATH AND CORONARY ANGIOGRAPHY N/A 08/13/2019   Procedure: RIGHT/LEFT HEART CATH AND CORONARY ANGIOGRAPHY;  Surgeon: Nelva Bush, MD;  Location: [place] [facility];  Service: Cardiovascular;  Laterality: N/A;  . THYROIDECTOMY  02/1986  . TOTAL HIP ARTHROPLASTY Right 03-16-2016   @[facility]   . TOTAL HIP REVISION  10/05/2012   Procedure: TOTAL HIP REVISION;  Surgeon: [name], MD;  [facility];  Service: Orthopedics;  Laterality: Right;  RIGHT TOTAL HIP REVISION  . TOTAL HIP REVISION Right 09/17/2013   Procedure: REVISION RIGHT TOTAL HIP ARTHROPLASTY ;  Surgeon: [name], MD;  [facility];  Service: Orthopedics;  Laterality: Right;  . TOTAL HIP REVISION Right 10/26/2013   Procedure: REVISION RIGHT TOTAL HIP ARTHROPLASTY;  Surgeon: [name], MD;  [facility];  Service:  Orthopedics;  Laterality: Right;  . TOTAL KNEE ARTHROPLASTY Bilateral right 03-15-2011;  left 06-30-2011  . TOTAL KNEE REVISION Left 04/11/2017   Procedure: TOTAL KNEE REVISION PATELLA and TIBIA;  Surgeon: Frederik Pear, MD;  Location: [place];  Service: Orthopedics;  Laterality: Left;    SOCIAL HISTORY: Social History   Socioeconomic History  . Marital status: Married    Spouse name: Not on file  . Number of children: Not on file  . Years of education: Not on file  . Highest education level: Not on file  Occupational History  . Not on file  Tobacco Use  . Smoking status: Former Smoker    Packs/day: 1.00    Years: 50.00    Pack years: 50.00    Types: Cigarettes    Quit date: 01/12/2012    Years since quitting: 7.8  . Smokeless tobacco: Never Used  Substance and Sexual Activity  . Alcohol use: Not Currently    Comment: occasional-social  . Drug use: Never  . Sexual activity: Yes  Other Topics Concern  . Not on file  Social History Narrative   [place] 2.5 months, went home Feb 21st slipped and fell on back and developed.  Home PT/OT.  Just started outpatient physical therapy.  Friday night, misstepped.     Social Determinants of Health   Financial Resource Strain:   . Difficulty of Paying Living Expenses: Not on file  Food Insecurity:   . Worried About Charity fundraiser in the Last Year: Not on file  . Ran Out of Food in the Last Year: Not on file  Transportation Needs:   . Lack of Transportation (Medical): Not on file  . Lack of Transportation (Non-Medical): Not on file  Physical Activity:   . Days of Exercise per Week: Not on file  . Minutes of Exercise per Session: Not on file  Stress:   . Feeling of Stress : Not on file  Social Connections:   . Frequency of Communication with Friends and Family: Not on file  . Frequency of Social Gatherings with Friends and Family: Not on file  . Attends Religious Services: Not on file  . Active Member of Clubs or Organizations: Not  on file  .  Attends Archivist Meetings: Not on file  . Marital Status: Not on file  Intimate Partner Violence:   . Fear of Current or Ex-Partner: Not on file  . Emotionally Abused: Not on file  . Physically Abused: Not on file  . Sexually Abused: Not on file    FAMILY HISTORY: Family History  Problem Relation Age of Onset  . CAD Father 90  . Asthma Father   . Alcohol abuse Father   . Arthritis Mother   . Alcohol abuse Sister     ALLERGIES:  is allergic to short ragweed pollen ext.  MEDICATIONS:  Current Outpatient Medications  Medication Sig Dispense Refill  . acetaminophen (TYLENOL) 500 MG tablet Take 1,000 mg by mouth daily as needed for moderate pain.    [name] alfuzosin (UROXATRAL) 10 MG 24 hr tablet Take 10 mg by mouth at bedtime.     [name] amoxicillin (AMOXIL) 500 MG capsule Take 2,000 mg by mouth See admin instructions. Take 4 capsules (2000 mg) by mouth 1 hour prior to dental work    . amoxicillin-clavulanate (AUGMENTIN) 500-125 MG tablet Take 1 tablet by mouth daily with supper.    [name] aspirin EC 81 MG tablet Take 81 mg by mouth daily.    [name] atorvastatin (LIPITOR) 80 MG tablet Take 1 tablet (80 mg total) by mouth daily. 90 tablet 2  . BELSOMRA 15 MG TABS TAKE 1 TABLET BY MOUTH  DAILY (Patient taking differently: Take 1 tablet by mouth at bedtime as needed. ) 90 tablet 1  . buPROPion (WELLBUTRIN XL) 300 MG 24 hr tablet TAKE 1 TABLET BY MOUTH  DAILY WITH BREAKFAST (Patient taking differently: Take 300 mg by mouth daily before breakfast. ) 90 tablet 3  . Calcium Citrate-Vitamin D (CALCIUM CITRATE + D PO) Take 1 tablet by mouth 2 (two) times daily.     . cefUROXime (CEFTIN) 500 MG tablet Take 1 tablet (500 mg total) by mouth 2 (two) times daily with a meal. 8 tablet 0  . Cholecalciferol (VITAMIN D-3 PO) Take 2,000 Units by mouth 2 (two) times daily.     . clonazePAM (KLONOPIN) 1 MG tablet TAKE 1 TABLET BY MOUTH  EVERY 6 HOURS AS NEEDED FOR ANXIETY (Patient taking differently:  Take 1 mg by mouth every 6 (six) hours as needed. ) 360 tablet 1  . denosumab (PROLIA) 60 MG/ML SOSY injection Inject 60 mg as directed every 6 (six) months. Last injection: Feb 2020 Next injection: August 2020    . diltiazem (CARDIZEM CD) 240 MG 24 hr capsule TAKE 1 CAPSULE BY MOUTH  DAILY (Patient taking differently: Take 240 mg by mouth daily. ) 90 capsule 3  . DULoxetine (CYMBALTA) 60 MG capsule TAKE 1 CAPSULE BY MOUTH  TWICE DAILY (Patient taking differently: Take 60 mg by mouth 2 (two) times daily. ) 180 capsule 3  . EPINEPHrine (EPIPEN 2-PAK) 0.3 mg/0.3 mL IJ SOAJ injection Inject 0.3 mg into the muscle daily as needed (for anaphylactic reaction).    . ezetimibe (ZETIA) 10 MG tablet Take 1 tablet (10 mg total) by mouth daily. 90 tablet 2  . ferrous sulfate 325 (65 FE) MG tablet Take 325 mg by mouth 2 (two) times daily with a meal.     . hydrochlorothiazide (HYDRODIURIL) 25 MG tablet Take 1 tablet (25 mg total) by mouth daily. (Patient taking differently: Take 25 mg by mouth daily. ) 90 tablet 3  . HYDROcodone-acetaminophen (NORCO) 7.5-325 MG tablet Take 1 tablet by mouth every 6 (  six) hours as needed for severe pain. 120 tablet 0  . HYDROcodone-acetaminophen (NORCO) 7.5-325 MG tablet Take 1 tablet by mouth 4 (four) times daily as needed for moderate pain. 120 tablet 0  . HYDROcodone-acetaminophen (NORCO) 7.5-325 MG tablet Take 1 tablet by mouth every 6 (six) hours as needed for severe pain. 30 tablet 0  . isosorbide mononitrate (IMDUR) 30 MG 24 hr tablet Take 1 tablet (30 mg total) by mouth daily. (Patient taking differently: Take 30 mg by mouth daily with lunch. ) 90 tablet 3  . levothyroxine (SYNTHROID) 200 MCG tablet TAKE 1 TABLET BY MOUTH  DAILY AT 6 AM (Patient taking differently: Take 200 mcg by mouth daily before breakfast. Pt takes w/ the 68mcg to = 234mcg every morning at 0500) 90 tablet 3  . levothyroxine (SYNTHROID) 25 MCG tablet TAKE 1 TABLET BY MOUTH  DAILY BEFORE BREAKFAST (Patient  taking differently: Take 25 mcg by mouth daily before breakfast. ) 90 tablet 3  . loratadine (CLARITIN) 10 MG tablet Take 10 mg by mouth daily.     . Magnesium 500 MG CAPS Take 1 capsule (500 mg total) by mouth daily. (Patient taking differently: Take 500 mg by mouth every evening. ) 30 capsule 1  . metoprolol tartrate (LOPRESSOR) 25 MG tablet Take 1 tablet (25 mg total) by mouth 2 (two) times daily. 180 tablet 2  . mirabegron ER (MYRBETRIQ) 25 MG TB24 tablet Take 25 mg by mouth at bedtime.     . Multiple Vitamin (MULTIVITAMIN WITH MINERALS) TABS tablet Take 1 tablet by mouth daily. Men's One-A-Day 16+    . nitroGLYCERIN (NITROSTAT) 0.4 MG SL tablet Place 0.4 mg under the tongue every 5 (five) minutes as needed for chest pain. x3 doses as needed for chest pain    . omeprazole (PRILOSEC) 40 MG capsule TAKE 1 CAPSULE BY MOUTH  DAILY BEFORE BREAKFAST 90 capsule 3  . OTEZLA 30 MG TABS Take 30 mg by mouth 2 (two) times a day.     . Probiotic Product (PROBIOTIC PO) Take 1 capsule by mouth daily with breakfast.    . rivaroxaban (XARELTO) 10 MG TABS tablet Take 1 tablet (10 mg total) by mouth daily. 90 tablet 1  . testosterone enanthate (DELATESTRYL) 200 MG/ML injection INJECT 1ML (200MG ) INTO THE MUSCLE EVERY 14 DAYS. DISCARD VIAL AFTER 28 DAYS. 5 mL 3  . tiZANidine (ZANAFLEX) 4 MG tablet TAKE 1 TABLET BY MOUTH;EVERY 6 HOURS AS NEEDED FOR MUSCLE SPASM(S) 360 tablet 1  . vitamin C (ASCORBIC ACID) 500 MG tablet Take 500 mg by mouth daily.     No current facility-administered medications for this visit.    REVIEW OF SYSTEMS:   A 10+ POINT REVIEW OF SYSTEMS WAS OBTAINED including neurology, dermatology, psychiatry, cardiac, respiratory, lymph, extremities, GI, GU, Musculoskeletal, constitutional, breasts, reproductive, HEENT.  All pertinent positives are noted in the HPI.  All others are negative.   PHYSICAL EXAMINATION: SKIN: chronic venous stasis changes in BLE, psoriasis rashes on elbows have improved  with Otezla   ECOG FS:3 - Symptomatic, >50% confined to bed  Vitals:   11/05/19 1226  BP: (!) 143/85  Pulse: 93  Resp: 18  Temp: 98.7 F (37.1 C)  SpO2: 94%   Wt Readings from Last 3 Encounters:  11/05/19 285 lb 3.2 oz (129.4 kg)  10/22/19 284 lb 12.8 oz (129.2 kg)  09/20/19 293 lb (132.9 kg)    Body mass index is 34.72 kg/m.    Exam was given in a  chair   GENERAL:alert, in no acute distress and comfortable SKIN: no acute rashes, no significant lesions EYES: conjunctiva are pink and non-injected, sclera anicteric OROPHARYNX: MMM, no exudates, no oropharyngeal erythema or ulceration NECK: supple, no JVD LYMPH:  no palpable lymphadenopathy in the cervical, axillary or inguinal regions LUNGS: clear to auscultation b/l with normal respiratory effort HEART: regular rate & rhythm ABDOMEN:  normoactive bowel sounds , non tender, not distended. No palpable hepatosplenomegaly.  Extremity: no pedal edema PSYCH: alert & oriented x 3 with fluent speech NEURO: no focal motor/sensory deficits  LABORATORY DATA:  I have reviewed the data as listed  . CBC Latest Ref Rng & Units 11/05/2019 10/22/2019 08/13/2019  WBC 4.0 - 10.5 K/uL 6.0 - -  Hemoglobin 13.0 - 17.0 g/dL 13.9 15.6 13.6  Hematocrit 39.0 - 52.0 % 43.3 46.0 40.0  Platelets 150 - 400 K/uL 198 - -    . CMP Latest Ref Rng & Units 11/05/2019 10/22/2019 10/17/2019  Glucose 70 - 99 mg/dL 123(H) 127(H) -  BUN 8 - 23 mg/dL 22 27(H) -  Creatinine 0.61 - 1.24 mg/dL 1.14 1.30(H) -  Sodium 135 - 145 mmol/L 140 138 -  Potassium 3.5 - 5.1 mmol/L 4.1 3.6 -  Chloride 98 - 111 mmol/L 101 99 -  CO2 22 - 32 mmol/L 30 - -  Calcium 8.9 - 10.3 mg/dL 9.3 - -  Total Protein 6.5 - 8.1 g/dL 7.0 - 7.1  Total Bilirubin 0.3 - 1.2 mg/dL 0.6 - 0.7  Alkaline Phos 38 - 126 U/L 82 - 88  AST 15 - 41 U/L 22 - 31  ALT 0 - 44 U/L 30 - 44   . Lab Results  Component Value Date   LDH 145 11/05/2019   06/28/2019 ECHOCARDIOGRAM COMPLETE (Accession  [id number])   05/29/18 Left tonsil biopsy:   RADIOGRAPHIC STUDIES: I have personally reviewed the radiological images as listed and agreed with the findings in the report. No results found.  ASSESSMENT & PLAN:  74 y.o. male with  1. Diffuse Large B-Cell Lymphoma,- Stage III Left tonsil -05/29/18 Left tonsil biopsy which revealed Diffuse Large B-Cell Lymphoma, which requires treatment  -06/07/18 LDH at 214, will monitor change through treatment.  -Baseline ECHO 06/14/18: EF 50-55% -Standard regimen of R-CHOP with G-CSF support for 6 cycles starting 06/23/18  06/16/18 PET/CT revealed 1. Deauville 5 hypermetabolic lesions in the palatine tonsils, left internal jugular chain, and involving a right inguinal lymph node. Deauville 4 involvement of a left inguinal lymph node and an enlarged subcutaneous lesion favoring lymph node superficial to the lower right sternocleidomastoid muscle in the neck. 2. Other imaging findings of potential clinical significance: Aortic Atherosclerosis. Coronary atherosclerosis. Emphysema. Bilateral nonobstructive nephrolithiasis. Umbilical hernia contains adipose tissues. Old right rib fractures some of which are nonunited. 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   08/22/18 PET/CT revealed Interval response to therapy. Overall Deauville criteria 3. Interval decrease in size and FDG uptake associated with palatine tonsil lesions. There has been resolution of previous left level-II and bilateral inguinal lymph nodes. 2. Enlarged subcutaneous lesion superficial to the lower right sternocleidomastoid muscle exhibits decreased FDG uptake. Currently Deauville criteria 2. 3. No new or progressive sites of disease identified. 4.  Aortic Atherosclerosis. 5. Multi vessel coronary artery atherosclerotic calcifications. 6. Unchanged vertebra plana deformity. 7. Kidney stones.   S/p 6 cycles of R-CHOP completed on 10/12/18  11/22/18 PET/CT revealed The tonsillar mass has essentially resolved. The large  presumed lymph node superficial to the right sternocleidomastoid  is stable in size at 2.3 cm in short axis, with maximum SUV of 3.3 (formerly 2.4), representing Deauville 3 activity, previously Deauville 2. 2. Other imaging findings of potential clinical significance: Aortic Atherosclerosis and Emphysema. Coronary atherosclerosis. Bilateral nonobstructive nephrolithiasis. Umbilical hernia contains adipose tissue. Chronic high activity posterior to the right hip implant along a region of bony irregularity and deformity probably reflecting chronic inflammation. L1 vertebra plana.  2.  Patient Active Problem List   Diagnosis Date Noted  . CHF (congestive heart failure) ([place]) 06/21/2019  . H/O nephrolithotomy with removal of calculi 03/26/2019  . Renal calculus 03/26/2019  . Generalized weakness 11/08/2018  . Well adult exam 10/02/2018  . Counseling regarding advance care planning and goals of care 07/09/2018  . Diffuse large B-cell lymphoma ([place]) 06/23/2018  . Port-A-Cath in place 06/23/2018  . Hypogonadism in male 11/22/2017  . Nephrolithiasis 10/24/2017  . Recurrent dislocation of left patella 07/15/2017  . Recurrent subluxation of patella, left 07/08/2017  . S/P revision of total knee 04/11/2017  . Failed total knee, left, initial encounter ([place]) 04/09/2017  . Renal calculus, right 02/08/2017  . Protein-calorie malnutrition, severe ([place]) 12/08/2016  . Chronic obstructive pulmonary disease ([place]) 12/08/2016  . Staghorn renal calculus 11/05/2016  . Wound drainage right hip 11/15/2013  . Expected blood loss anemia 09/18/2013  . Hyponatremia 09/18/2013  . S/P right TH revision 09/17/2013  . Nonunion, fracture, Right tibia  07/03/2013  . UTI (urinary tract infection) 07/03/2013  . Pathological fracture due to osteoporosis with delayed healing 04/24/2013  . Osteoporosis with fracture 04/24/2013  . Rash and nonspecific skin eruption 03/08/2013  . Syncope 02/21/2013  . PAF (paroxysmal atrial  fibrillation) ([place]) 02/21/2013  . Chronic anticoagulation 02/21/2013  . Fall 02/08/2013  . Physical deconditioning 02/08/2013  . Low back pain radiating to both legs 02/08/2013  . Compression fracture of L1 lumbar vertebra ([facility]) 02/08/2013  . Right tibial fracture 02/05/2013  . Obesity, unspecified 02/05/2013  . Osteomyelitis of right wrist ([place]) 11/02/2012  . Anemia 10/06/2012  . HTN (hypertension) 10/03/2012  . Psoriasis 10/03/2012  . Dyslipidemia 10/03/2012  . Vitamin D deficiency 10/03/2012  . GERD (gastroesophageal reflux disease) 10/03/2012  . Hyperglycemia 10/03/2012  . Anxiety 10/03/2012  . Depression 10/03/2012  . CAD (coronary artery disease) 10/03/2012  . DJD (degenerative joint disease) 10/03/2012  . Chronic gingivitis 10/03/2012  . Low testosterone, possible hypogonadism 10/01/2012  . Normocytic anemia 09/30/2012  . Right femoral fracture ([place]) 09/29/2012  . Right forearm fracture 09/29/2012  . Atrial fibrillation ([place]) 09/29/2012  . Hypothyroidism 09/29/2012  . History of recurrent UTIs 09/29/2012   PLAN:  -Discussed pt labwork today, 11/05/19; blood counts look good, blood chemistries are steady  -Discussed 11/05/2019 LDH is WNL at 145 -Pt is now one year out from treatment (10/12/2018) -No clinical or lab evidence of lymphoma recurrence/progression. -Will continue 3 month follow-ups, will consider moving to 6 month follow-ups in one year if labs/exams are stable -Recommended pt receive COVID19 vaccine when available -Continue to f/u with Dr. Oval Linsey for CHF and diuretic management -Continue to f/u with Dermatology as schduled -Will see back in 3 months with labs    FOLLOW UP: RTC with Dr Irene Limbo with labs in 3 months   The total time spent in the appt was 20 minutes and more than 50% was on counseling and direct patient cares.  All of the patient's questions were answered with apparent satisfaction. The patient knows to call the clinic with any problems,  questions  or concerns.   Sullivan Lone MD [place] AAHIVMS [facility] Hematology/Oncology Physician [facility]  (Office):       [phone] (Work cell):  [phone] (Fax):           [phone]  11/05/2019 1:22 PM  I, Yevette Edwards, am acting as a scribe for Dr. Sullivan Lone.   .I have reviewed the above documentation for accuracy and completeness, and I agree with the above. Brunetta Genera MD

## 2019-11-06 ENCOUNTER — Ambulatory Visit: Payer: Medicare PPO | Admitting: Cardiovascular Disease

## 2019-11-06 ENCOUNTER — Encounter: Payer: Self-pay | Admitting: Cardiovascular Disease

## 2019-11-06 VITALS — BP 130/78 | HR 72 | Temp 97.1°F | Ht 76.0 in | Wt 283.8 lb

## 2019-11-06 DIAGNOSIS — Z79899 Other long term (current) drug therapy: Secondary | ICD-10-CM | POA: Diagnosis not present

## 2019-11-06 DIAGNOSIS — E7800 Pure hypercholesterolemia, unspecified: Secondary | ICD-10-CM

## 2019-11-06 DIAGNOSIS — I2510 Atherosclerotic heart disease of native coronary artery without angina pectoris: Secondary | ICD-10-CM

## 2019-11-06 DIAGNOSIS — I4819 Other persistent atrial fibrillation: Secondary | ICD-10-CM

## 2019-11-06 MED ORDER — HYDROCHLOROTHIAZIDE 12.5 MG PO TABS: 12.5000 mg | ORAL_TABLET | Freq: Every day | ORAL | 3 refills | Status: DC

## 2019-11-06 MED ORDER — LOSARTAN POTASSIUM 50 MG PO TABS: 50.0000 mg | ORAL_TABLET | Freq: Every day | ORAL | 3 refills | Status: DC

## 2019-11-06 NOTE — Progress Notes (Signed)
Cardiology Office Note   Date:  11/06/2019   ID:  Waqas, Largent [birth year], MRN [id number]  PCP:  Cassandria Anger, MD  Cardiologist:   Skeet Latch, MD   No chief complaint on file.   History of Present Illness: [name] is a 74 y.o. male with CAD, long-standing-persistent atrial fibrillation, hypertension, hyperlipidemia, sleep apnea on CPAP, diffuse large B cell lymphoma, hypothyroidism and prior tobacco abuse here for follow-up.  He was initially seen 05/2017 to establish care.  [name] was previously a patient of [name] and last saw him 01/2016.  At that time he was preparing for left hip surgery.  [name] recommended stress testing but he declined.  He underwent surgery with no complications.  He was initially diagnosed with atrial fibrillation around 2012.  He was started on diltiazem and amiodarone.  He was noted to be in persistent atrial fibrillation.  Therefore amiodarone was discontinued. At the time of his atrial fibrillation diagnosis he also underwent a heart catheterization. He reportedly had 90% obstruction of an unknown vessel that had developed collaterals. He was started on Ranexa.  He had an echo 11/03/16 revealed LVEF of 60-65%. Severe left and right atrial enlargement was noted and the IVC was dilated.  He had no angina and so Ranexa was discontinued.  Consideration was made for starting a beta-blocker instead of a calcium channel blocker.  However we decided not to make too many changes at one time.  [name] was diagnosed with diffuse large B cell lymphoma on 05/2018.  He completed 6 cycles of chemotherapy with rituximab, cyclophosphamide, doxorubicin, vincristine, and prednisone.  He had a baseline echo 06/2018 that revealed LVEF 50 to 55% and normal diastolic function.  He had apical hypokinesis and GLS was -13.9. Heart rate was poorly controlled so metoprolol was added to his regimen.   He was seen in he ED and BNP was mildly  elevated to 283.  He was started on lasix 20mg  daily and has noticed an improvement in his breathing.  He was referred for an echo 06/28/19 that revealed LVEF 50-55%.  GLS was -10.1%. The RV had mildly reduced systolic function.  He complained of frequent urination and his blood pressure was poorly controlled so Lasix was switched to hydrochlorothiazide.  There is concerned that his symptoms could be due to ischemia.  He underwent left heart catheterization 08/2019.  He was found to have significant two-vessel CAD including sequential 60 and 70% mid LAD lesions and chronic total occlusion of the mid RCA.  There were bridging left to right collaterals.  Right atrial pressure was 13.  Pulmonary capillary wedge pressure was 20 to 25 mmHg.  There was a consideration for two-vessel CABG versus medical management.  He was not deemed to be a good candidate for surgery.  He was started on Imdur.  Since then he has not had any chest pain and his breathing has been stable.  He struggled with 3 kidney stones.  He is not getting much exercise lately due to the weather.  He plans to start walking whenever it is over 50 degrees.  He continues to have frequent urination every hour.  He has not experienced any lower extremity edema, orthopnea, or PND lately.   Past Medical History:  Diagnosis Date  . Anticoagulant long-term use    xarelto--- takes for AFib,  managed by pcp, Dr Alain Marion  . Benign localized prostatic hyperplasia with lower urinary tract symptoms (LUTS)   .  CAD (coronary artery disease) cardiologist--  dr t. Oval Linsey   hx of known CAD obstruction w/ collaterals (cath done @ [facility] 12-31-2011) ;   last cardiac cath 08-13-2019  showed sig. 2V CAD involving proxLAD and CTO of the RCA/  chronic total occlusion midRCA w/ bridging and L>R collaterals  . Diastolic CHF ([place])    dx [id number] hospital admission (followed by pcp)  . Diffuse large B cell lymphoma [facility]) oncologist-- dr Irene Limbo--- in remission   dx 08/ 2019  -- bx left tonsill mass-- involving lymph nodes--- completed chemotherapy 10-12-2018  . DJD (degenerative joint disease)   . Dysthymic disorder   . Elevated brain natriuretic peptide (BNP) level   . Environmental allergies   . GAD (generalized anxiety disorder)   . GERD (gastroesophageal reflux disease)   . History of falling    multiple times  . History of Graves' disease    1987 s/p total thyroidectomy  . History of kidney stones   . History of scabies    07/ 2014  resolved  . History of syncope    per D/C note in epic 02-21-2013 ?vasovagal  . History of vertebral compression fracture    05/ 2014 --  L1, no surgical intervention  . HLD (hyperlipidemia)   . Hyperlipidemia   . Hypertension   . Hypogonadism in male   . IDA (iron deficiency anemia)   . Lumbago   . OA (osteoarthritis)   . OSA on CPAP    cpap machine-settings 17  . Osteoporosis    Severe  . Persistent atrial fibrillation [facility])    cardiologist--- dr r. Oval Linsey  . Plaque psoriasis    dermatologist--- dr Harriett Sine--- currently taking otezla  . Post-surgical hypothyroidism    followed by pcp---  s/p total thyroidectomy for graves disease in 1987  . Renal calculus, right   . Thrombocytopenia ([place])   . Unsteady gait    uses walker  . Wears hearing aid in both ears     Past Surgical History:  Procedure Laterality Date  . APPENDECTOMY  2011 approx.   [name] CARDIAC CATHETERIZATION  12-31-2011   dr Lenna Sciara. Beatrix Fetters @[facility]    normal LVF w/ multivessel CAD,  occluded RCA w/ colleterals  . CATARACT EXTRACTION W/ INTRAOCULAR LENS  IMPLANT, BILATERAL  2016 approx.  . COLONOSCOPY    . CYSTOSCOPY WITH RETROGRADE PYELOGRAM, URETEROSCOPY AND STENT PLACEMENT Left 11/07/2017   Procedure: CYSTOSCOPY WITH RETROGRADE PYELOGRAM, URETEROSCOPY AND STENT PLACEMENT;  Surgeon: Cleon Gustin, MD;  Location: [facility];  Service: Urology;  Laterality: Left;  . CYSTOSCOPY WITH RETROGRADE PYELOGRAM, URETEROSCOPY AND STENT PLACEMENT Right  10/22/2019   Procedure: CYSTOSCOPY WITH RETROGRADE PYELOGRAM, URETEROSCOPY AND STENT PLACEMENT;  Surgeon: Cleon Gustin, MD;  Location[facility];  Service: Urology;  Laterality: Right;  . CYSTOSCOPY/URETEROSCOPY/HOLMIUM LASER/STENT PLACEMENT Right 03/11/2017   Procedure: CYSTOSCOPY/URETEROSCOPYSTENT PLACEMENT right ureter retrograde pylegram;  Surgeon: Cleon Gustin, MD;  [facility];  Service: Urology;  Laterality: Right;  . HARDWARE REMOVAL  10/05/2012   Procedure: HARDWARE REMOVAL;  Surgeon: [name], MD;  [facility];  Service: Orthopedics;  Laterality: Right;  REMOVING  STRYKER  GAMMA NAIL  . HARDWARE REMOVAL Right 07/03/2013   Procedure: HARDWARE REMOVAL RIGHT TIBIA ;  Surgeon: Rozanna Box, MD;  Location: [place];  Service: Orthopedics;  Laterality: Right;  . HIP CLOSED REDUCTION Right 10/15/2013   Procedure: CLOSED MANIPULATION HIP;  Surgeon: [name], MD;  [facility];  Service: Orthopedics;  Laterality: Right;  . HOLMIUM LASER APPLICATION Right [id number]   Procedure: HOLMIUM LASER APPLICATION;  Surgeon: Cleon Gustin, MD;  [facility];  Service: Urology;  Laterality: Right;  . HOLMIUM LASER APPLICATION Left [id number]   Procedure: HOLMIUM LASER APPLICATION;  Surgeon: Cleon Gustin, MD;  [facility];  Service: Urology;  Laterality: Left;  . HOLMIUM LASER APPLICATION Left [id number]   Procedure: HOLMIUM LASER APPLICATION;  Surgeon: Cleon Gustin, MD;  [facility];  Service: Urology;  Laterality: Left;  . HOLMIUM LASER APPLICATION Right [id number]   Procedure: HOLMIUM LASER APPLICATION;  Surgeon: Cleon Gustin, MD;  Location: [facility];  Service: Urology;  Laterality: Right;  . INCISION AND DRAINAGE HIP Right 11/16/2013   Procedure: IRRIGATION AND DEBRIDEMENT RIGHT HIP;  Surgeon: [name], MD;  [facility];  Service: Orthopedics;  Laterality: Right;  . INTRAVASCULAR PRESSURE  WIRE/FFR STUDY N/A 08/13/2019   Procedure: INTRAVASCULAR PRESSURE WIRE/FFR STUDY;  Surgeon: Nelva Bush, MD;  Location: [place] [facility];  Service: Cardiovascular;  Laterality: N/A;  . IR IMAGING GUIDED PORT INSERTION  06/22/2018  . IR NEPHROSTOMY PLACEMENT LEFT  03/28/2019  . IR URETERAL STENT LEFT NEW ACCESS W/O SEP NEPHROSTOMY CATH  10/24/2017  . IR URETERAL STENT LEFT NEW ACCESS W/O SEP NEPHROSTOMY CATH  03/26/2019  . NEPHROLITHOTOMY Right 02/08/2017   Procedure: NEPHROLITHOTOMY PERCUTANEOUS WITH SURGEON ACCESS;  Surgeon: Cleon Gustin, MD;  [facility];  Service: Urology;  Laterality: Right;  . NEPHROLITHOTOMY Left 10/24/2017   Procedure: NEPHROLITHOTOMY PERCUTANEOUS;  Surgeon: Cleon Gustin, MD;  [facility];  Service: Urology;  Laterality: Left;  . NEPHROLITHOTOMY Left 03/26/2019   Procedure: NEPHROLITHOTOMY PERCUTANEOUS;  Surgeon: Cleon Gustin, MD;  [facility];  Service: Urology;  Laterality: Left;  2 HRS  . NEPHROLITHOTOMY Left 04/17/2019   Procedure: NEPHROLITHOTOMY PERCUTANEOUS;  Surgeon: Cleon Gustin, MD;  [facility];  Service: Urology;  Laterality: Left;  2 HRS  . ORIF TIBIA FRACTURE Right 02/06/2013   Procedure: OPEN REDUCTION INTERNAL FIXATION (ORIF) TIBIA FRACTURE WITH IM ROD FIBULA;  Surgeon: Rozanna Box, MD;  Location: [place];  Service: Orthopedics;  Laterality: Right;  . ORIF TIBIA FRACTURE Right 07/03/2013   Procedure: RIGHT TIBIA NON UNION REPAIR ;  Surgeon: Rozanna Box, MD;  Location: [place];  Service: Orthopedics;  Laterality: Right;  . ORIF WRIST FRACTURE  10/02/2012   Procedure: OPEN REDUCTION INTERNAL FIXATION (ORIF) WRIST FRACTURE;  Surgeon: Roseanne Kaufman, MD;  [facility];  Service: Orthopedics;  Laterality: Right;  WITH   ANTIBIOTIC  CEMENT  . ORIF WRIST FRACTURE Left 10/28/2013   Procedure: OPEN REDUCTION INTERNAL FIXATION (ORIF) WRIST FRACTURE with allograft;  Surgeon: Roseanne Kaufman, MD;  [facility];   Service: Orthopedics;  Laterality: Left;  DVR Plate  . QUADRICEPS TENDON REPAIR Left 07/15/2017   Procedure: REPAIR QUADRICEP TENDON;  Surgeon: Frederik Pear, MD;  Location: [place];  Service: Orthopedics;  Laterality: Left;  . RIGHT/LEFT HEART CATH AND CORONARY ANGIOGRAPHY N/A 08/13/2019   Procedure: RIGHT/LEFT HEART CATH AND CORONARY ANGIOGRAPHY;  Surgeon: Nelva Bush, MD;  Location: [place] [facility];  Service: Cardiovascular;  Laterality: N/A;  . THYROIDECTOMY  02/1986  . TOTAL HIP ARTHROPLASTY Right 03-16-2016   @[facility]   . TOTAL HIP REVISION  10/05/2012   Procedure: TOTAL HIP REVISION;  Surgeon: [name], MD;  [facility];  Service: Orthopedics;  Laterality: Right;  RIGHT TOTAL HIP REVISION  . TOTAL HIP REVISION Right 09/17/2013   Procedure: REVISION RIGHT TOTAL HIP ARTHROPLASTY ;  Surgeon: [name], MD;  [facility];  Service: Orthopedics;  Laterality: Right;  . TOTAL HIP REVISION Right 10/26/2013   Procedure: REVISION RIGHT TOTAL HIP ARTHROPLASTY;  Surgeon: [name], MD;  [facility];  Service: Orthopedics;  Laterality: Right;  . TOTAL KNEE ARTHROPLASTY Bilateral right 03-15-2011;  left 06-30-2011  . TOTAL KNEE REVISION Left 04/11/2017   Procedure: TOTAL KNEE REVISION PATELLA and TIBIA;  Surgeon: Frederik Pear, MD;  Location: [place];  Service: Orthopedics;  Laterality: Left;     Current Outpatient Medications  Medication Sig Dispense Refill  . acetaminophen (TYLENOL) 500 MG tablet Take 1,000 mg by mouth daily as needed for moderate pain.    [name] alfuzosin (UROXATRAL) 10 MG 24 hr tablet Take 10 mg by mouth at bedtime.     [name] amoxicillin (AMOXIL) 500 MG capsule Take 2,000 mg by mouth See admin instructions. Take 4 capsules (2000 mg) by mouth 1 hour prior to dental work    . amoxicillin-clavulanate (AUGMENTIN) 500-125 MG tablet Take 1 tablet by mouth daily with supper.    [name] aspirin EC 81 MG tablet Take 81 mg by mouth daily.    [name] atorvastatin (LIPITOR) 80 MG tablet  Take 1 tablet (80 mg total) by mouth daily. 90 tablet 2  . BELSOMRA 15 MG TABS TAKE 1 TABLET BY MOUTH  DAILY (Patient taking differently: Take 1 tablet by mouth at bedtime as needed. ) 90 tablet 1  . buPROPion (WELLBUTRIN XL) 300 MG 24 hr tablet TAKE 1 TABLET BY MOUTH  DAILY WITH BREAKFAST (Patient taking differently: Take 300 mg by mouth daily before breakfast. ) 90 tablet 3  . Calcium Citrate-Vitamin D (CALCIUM CITRATE + D PO) Take 1 tablet by mouth 2 (two) times daily.     . cefUROXime (CEFTIN) 500 MG tablet Take 1 tablet (500 mg total) by mouth 2 (two) times daily with a meal. 8 tablet 0  . Cholecalciferol (VITAMIN D-3 PO) Take 2,000 Units by mouth 2 (two) times daily.     . clonazePAM (KLONOPIN) 1 MG tablet TAKE 1 TABLET BY MOUTH  EVERY 6 HOURS AS NEEDED FOR ANXIETY (Patient taking differently: Take 1 mg by mouth every 6 (six) hours as needed. ) 360 tablet 1  . denosumab (PROLIA) 60 MG/ML SOSY injection Inject 60 mg as directed every 6 (six) months. Last injection: Feb 2020 Next injection: August 2020    . diltiazem (CARDIZEM CD) 240 MG 24 hr capsule TAKE 1 CAPSULE BY MOUTH  DAILY (Patient taking differently: Take 240 mg by mouth daily. ) 90 capsule 3  . DULoxetine (CYMBALTA) 60 MG capsule TAKE 1 CAPSULE BY MOUTH  TWICE DAILY (Patient taking differently: Take 60 mg by mouth 2 (two) times daily. ) 180 capsule 3  . EPINEPHrine (EPIPEN 2-PAK) 0.3 mg/0.3 mL IJ SOAJ injection Inject 0.3 mg into the muscle daily as needed (for anaphylactic reaction).    . ezetimibe (ZETIA) 10 MG tablet Take 1 tablet (10 mg total) by mouth daily. 90 tablet 2  . ferrous sulfate 325 (65 FE) MG tablet Take 325 mg by mouth 2 (two) times daily with a meal.     . hydrochlorothiazide (HYDRODIURIL) 25 MG tablet Take 1 tablet (25 mg total) by mouth daily. (Patient taking differently: Take 25 mg by mouth daily. ) 90 tablet 3  . HYDROcodone-acetaminophen (NORCO) 7.5-325 MG tablet  Take 1 tablet by mouth every 6 (six) hours as needed  for severe pain. 120 tablet 0  . HYDROcodone-acetaminophen (NORCO) 7.5-325 MG tablet Take 1 tablet by mouth 4 (four) times daily as needed for moderate pain. 120 tablet 0  . HYDROcodone-acetaminophen (NORCO) 7.5-325 MG tablet Take 1 tablet by mouth every 6 (six) hours as needed for severe pain. 30 tablet 0  . isosorbide mononitrate (IMDUR) 30 MG 24 hr tablet Take 1 tablet (30 mg total) by mouth daily. (Patient taking differently: Take 30 mg by mouth daily with lunch. ) 90 tablet 3  . levothyroxine (SYNTHROID) 200 MCG tablet TAKE 1 TABLET BY MOUTH  DAILY AT 6 AM (Patient taking differently: Take 200 mcg by mouth daily before breakfast. Pt takes w/ the 20mcg to = 24mcg every morning at 0500) 90 tablet 3  . levothyroxine (SYNTHROID) 25 MCG tablet TAKE 1 TABLET BY MOUTH  DAILY BEFORE BREAKFAST (Patient taking differently: Take 25 mcg by mouth daily before breakfast. ) 90 tablet 3  . loratadine (CLARITIN) 10 MG tablet Take 10 mg by mouth daily.     . Magnesium 500 MG CAPS Take 1 capsule (500 mg total) by mouth daily. (Patient taking differently: Take 500 mg by mouth every evening. ) 30 capsule 1  . metoprolol tartrate (LOPRESSOR) 25 MG tablet Take 1 tablet (25 mg total) by mouth 2 (two) times daily. 180 tablet 2  . mirabegron ER (MYRBETRIQ) 25 MG TB24 tablet Take 25 mg by mouth at bedtime.     . Multiple Vitamin (MULTIVITAMIN WITH MINERALS) TABS tablet Take 1 tablet by mouth daily. Men's One-A-Day 76+    . nitroGLYCERIN (NITROSTAT) 0.4 MG SL tablet Place 0.4 mg under the tongue every 5 (five) minutes as needed for chest pain. x3 doses as needed for chest pain    . omeprazole (PRILOSEC) 40 MG capsule TAKE 1 CAPSULE BY MOUTH  DAILY BEFORE BREAKFAST 90 capsule 3  . OTEZLA 30 MG TABS Take 30 mg by mouth 2 (two) times a day.     . Probiotic Product (PROBIOTIC PO) Take 1 capsule by mouth daily with breakfast.    . rivaroxaban (XARELTO) 10 MG TABS tablet Take 1 tablet (10 mg total) by mouth daily. 90 tablet 1    . testosterone enanthate (DELATESTRYL) 200 MG/ML injection INJECT 1ML (200MG ) INTO THE MUSCLE EVERY 14 DAYS. DISCARD VIAL AFTER 28 DAYS. 5 mL 3  . tiZANidine (ZANAFLEX) 4 MG tablet TAKE 1 TABLET BY MOUTH;EVERY 6 HOURS AS NEEDED FOR MUSCLE SPASM(S) 360 tablet 1  . vitamin C (ASCORBIC ACID) 500 MG tablet Take 500 mg by mouth daily.     No current facility-administered medications for this visit.    Allergies:   Short ragweed pollen ext    Social History:  The patient  reports that he quit smoking about 7 years ago. His smoking use included cigarettes. He has a 50.00 pack-year smoking history. He has never used smokeless tobacco. He reports previous alcohol use. He reports that he does not use drugs.   Family History:  The patient's family history includes Alcohol abuse in his father and sister; Arthritis in his mother; Asthma in his father; CAD (age of onset: 79) in his father.    ROS:  Please see the history of present illness.   Otherwise, review of systems are positive for none.   All other systems are reviewed and negative.    PHYSICAL EXAM: VS:  BP 130/78   Pulse 72  Temp (!) 97.1 F (36.2 C)   Ht 6\' 4"  (1.93 m)   Wt 283 lb 12.8 oz (128.7 kg)   SpO2 95%   BMI 34.55 kg/m  , BMI Body mass index is 34.55 kg/m. GENERAL:  Well appearing HEENT: Pupils equal round and reactive, fundi not visualized, oral mucosa unremarkable NECK:  No jugular venous distention, waveform within normal limits, carotid upstroke brisk and symmetric, no bruits LUNGS:  Clear to auscultation bilaterally HEART:  Irregularly irregular.  PMI not displaced or sustained,S1 and S2 within normal limits, no S3, no S4, no clicks, no rubs, no murmurs ABD:  Flat, positive bowel sounds normal in frequency in pitch, no bruits, no rebound, no guarding, no midline pulsatile mass, no hepatomegaly, no splenomegaly EXT:  2 plus pulses throughout, trace edema, no cyanosis no clubbing SKIN:  No rashes no nodules NEURO:   Cranial nerves II through XII grossly intact, motor grossly intact throughout PSYCH:  Cognitively intact, oriented to person place and time   EKG:  EKG is ordered today. The ekg ordered today demonstrates Atrial fibrillation. Rate 71 bpm. Left axis deviation. Nonspecific IVCD. 12/26/17: Atrial fibrillation.  Rate 95 bpm.  Cannot rule out prior anterior infarct. 07/04/18: Atrial fibrillation.  Rate 118 bpm.  Right axis deviation. 08/06/19: Atrial fibrillation.  Rate 86 bpm.  Prior anteroseptal infarct.  Poor R wave progression.   Echo 11/03/16: Study Conclusions  - Left ventricle: Systolic function was normal. The estimated   ejection fraction was in the range of 60% to 65%. The study is   not technically sufficient to allow evaluation of LV diastolic   function. - Left atrium: Severely dilated. - Right atrium: Severely dilated.  Impressions:  - Technically difficult study with poor echo windows. The LVEF is   grossly normal at 60-65%, there is severe biatrial enlargment,   the IVC is dilated.  LHC 08/13/19: Conclusions: 1. Significant two-vessel coronary artery disease, including sequential 60% proximal and 70% mid LAD stenosis, which are hemodynamically significant by DFR and also involve large D2 branch, as well as chronic total occlusion of the mid RCA with bridging and left-to-right collaterals. 2. Mildly elevated left heart and pulmonary artery pressures.  Prominent v-waves noted on PCWP; question if mitral regurgitation is more severe than appreciated on recent transthoracic echocardiogram. 3. Moderately elevated right heart filling pressures. 4. Normal Fick cardiac output/index.  Diagnostic Dominance: Right   RHC RA (mean): 13 mmHg RV (S/EDP): 49/14 mmHg PA (S/D, mean): 49/22 (31) mmHg PCWP (mean): 20-25 mmHg with prominent v-waves  Ao sat: 93% PA sat: 70%  Fick CO: 85 L/min Fick CI: 3.3 L/min/m^2  Recent Labs: 11/09/2018: TSH 0.130 06/15/2019: B Natriuretic  Peptide 283.8 11/05/2019: ALT 30; BUN 22; Creatinine 1.14; Hemoglobin 13.9; Platelets 198; Potassium 4.1; Sodium 140    Lipid Panel    Component Value Date/Time   CHOL 115 10/17/2019 1127   TRIG 47 10/17/2019 1127   HDL 55 10/17/2019 1127   CHOLHDL 2.1 10/17/2019 1127   CHOLHDL 2 10/02/2018 1114   VLDL 16.8 10/02/2018 1114   LDLCALC 49 10/17/2019 1127      Wt Readings from Last 3 Encounters:  11/06/19 283 lb 12.8 oz (128.7 kg)  11/05/19 285 lb 3.2 oz (129.4 kg)  10/22/19 284 lb 12.8 oz (129.2 kg)      ASSESSMENT AND PLAN:  # Angina: # CAD: # Hyperlipidemia: [name] reportedly has obstructive coronary artery disease with evidence of collateralization. He is doing well with the addition  of Imdur.  No plan for CABG.  Continue metoprolol, aspirin, atorvastatin, Zetia, Imdur and metoprolol.  LDL goal <70.  # Hypertension:   # LE Edema:  Blood pressures well-controlled.  However he is struggling with frequent urination.  Right atrial pressure was 11 mmHg.  He still needs a diuretic but did not tolerate Lasix.  We will try reducing hydrochlorothiazide to 12.5 mg and see how he does.  This means his blood pressure will be elevated.  We will add losartan 50 mg daily which will help with afterload reduction.  Continue metoprolol, hydrochlorothiazide 12.5 mg, Imdur, and diltiazem.  # Longstanding persistent atrial fibrillation: Rates are better controlled since adding metoprolol.  Continue diltiazem and Xarelto.    # Morbid obesity:  Encouraged him to work with physical therapy as prescribed and try to walk when able.  # OSA: Continue CPAP   Current medicines are reviewed at length with the patient today.  The patient does not have concerns regarding medicines.  The following changes have been made: None  Labs/ tests ordered today include:   No orders of the defined types were placed in this encounter.    Disposition:   FU with Courtany Mcmurphy C. Oval Linsey, MD, [facility] in 3 months.         Signed, Jesika Men C. Oval Linsey, MD, [facility]  11/06/2019 2:47 PM    [place]

## 2019-11-06 NOTE — Patient Instructions (Addendum)
COVID-19 Vaccine Information can be found at: [email or web address].uk For questions related to vaccine distribution or appointments, please email [email or web address] or call [phone].   Medication Instructions:  DECREASE- Hydrochlorothiazide 12.5 mg by mouth daily START- Losartan 50 mg by mouth daily  *If you need a refill on your cardiac medications before your next appointment, please call your pharmacy*  Lab Work: BNP   If you have labs (blood work) drawn today and your tests are completely normal, you will receive your results only by: [name] MyChart Message (if you have MyChart) OR . A paper copy in the mail If you have any lab test that is abnormal or we need to change your treatment, we will call you to review the results.  Testing/Procedures: None Ordered  Follow-Up: At [facility], you and your health needs are our priority.  As part of our continuing mission to provide you with exceptional heart care, we have created designated Provider Care Teams.  These Care Teams include your primary Cardiologist (physician) and Advanced Practice Providers (APPs -  Physician Assistants and Nurse Practitioners) who all work together to provide you with the care you need, when you need it.  Your next appointment:   3 month(s)  The format for your next appointment:   In Person  Provider:   Skeet Latch, MD

## 2019-11-09 ENCOUNTER — Telehealth: Payer: Self-pay | Admitting: Hematology

## 2019-11-09 NOTE — Telephone Encounter (Signed)
Scheduled per 01/25 los, patient has been called and notified. 

## 2019-11-12 DIAGNOSIS — J3089 Other allergic rhinitis: Secondary | ICD-10-CM | POA: Diagnosis not present

## 2019-11-12 DIAGNOSIS — J301 Allergic rhinitis due to pollen: Secondary | ICD-10-CM | POA: Diagnosis not present

## 2019-11-18 ENCOUNTER — Other Ambulatory Visit: Payer: Self-pay | Admitting: Cardiovascular Disease

## 2019-11-21 DIAGNOSIS — F410 Panic disorder [episodic paroxysmal anxiety] without agoraphobia: Secondary | ICD-10-CM | POA: Diagnosis not present

## 2019-11-21 DIAGNOSIS — F331 Major depressive disorder, recurrent, moderate: Secondary | ICD-10-CM | POA: Diagnosis not present

## 2019-11-22 DIAGNOSIS — Z8719 Personal history of other diseases of the digestive system: Secondary | ICD-10-CM | POA: Diagnosis not present

## 2019-11-22 DIAGNOSIS — K219 Gastro-esophageal reflux disease without esophagitis: Secondary | ICD-10-CM | POA: Diagnosis not present

## 2019-11-22 DIAGNOSIS — E291 Testicular hypofunction: Secondary | ICD-10-CM | POA: Diagnosis not present

## 2019-11-22 DIAGNOSIS — F329 Major depressive disorder, single episode, unspecified: Secondary | ICD-10-CM | POA: Diagnosis not present

## 2019-11-22 DIAGNOSIS — Z5181 Encounter for therapeutic drug level monitoring: Secondary | ICD-10-CM | POA: Diagnosis not present

## 2019-11-22 DIAGNOSIS — C8590 Non-Hodgkin lymphoma, unspecified, unspecified site: Secondary | ICD-10-CM | POA: Diagnosis not present

## 2019-11-22 DIAGNOSIS — Z9221 Personal history of antineoplastic chemotherapy: Secondary | ICD-10-CM | POA: Diagnosis not present

## 2019-11-22 DIAGNOSIS — M810 Age-related osteoporosis without current pathological fracture: Secondary | ICD-10-CM | POA: Diagnosis not present

## 2019-11-22 DIAGNOSIS — Z79899 Other long term (current) drug therapy: Secondary | ICD-10-CM | POA: Diagnosis not present

## 2019-11-22 DIAGNOSIS — Z9181 History of falling: Secondary | ICD-10-CM | POA: Diagnosis not present

## 2019-11-22 DIAGNOSIS — Z8679 Personal history of other diseases of the circulatory system: Secondary | ICD-10-CM | POA: Diagnosis not present

## 2019-11-22 DIAGNOSIS — E039 Hypothyroidism, unspecified: Secondary | ICD-10-CM | POA: Diagnosis not present

## 2019-11-22 DIAGNOSIS — R7989 Other specified abnormal findings of blood chemistry: Secondary | ICD-10-CM | POA: Diagnosis not present

## 2019-11-26 DIAGNOSIS — D2271 Melanocytic nevi of right lower limb, including hip: Secondary | ICD-10-CM | POA: Diagnosis not present

## 2019-11-26 DIAGNOSIS — L821 Other seborrheic keratosis: Secondary | ICD-10-CM | POA: Diagnosis not present

## 2019-11-26 DIAGNOSIS — Z85828 Personal history of other malignant neoplasm of skin: Secondary | ICD-10-CM | POA: Diagnosis not present

## 2019-11-26 DIAGNOSIS — D485 Neoplasm of uncertain behavior of skin: Secondary | ICD-10-CM | POA: Diagnosis not present

## 2019-11-26 DIAGNOSIS — L814 Other melanin hyperpigmentation: Secondary | ICD-10-CM | POA: Diagnosis not present

## 2019-11-26 DIAGNOSIS — L400 Psoriasis vulgaris: Secondary | ICD-10-CM | POA: Diagnosis not present

## 2019-11-26 DIAGNOSIS — D225 Melanocytic nevi of trunk: Secondary | ICD-10-CM | POA: Diagnosis not present

## 2019-12-07 DIAGNOSIS — N200 Calculus of kidney: Secondary | ICD-10-CM | POA: Diagnosis not present

## 2019-12-07 DIAGNOSIS — J3089 Other allergic rhinitis: Secondary | ICD-10-CM | POA: Diagnosis not present

## 2019-12-07 DIAGNOSIS — J301 Allergic rhinitis due to pollen: Secondary | ICD-10-CM | POA: Diagnosis not present

## 2019-12-12 DIAGNOSIS — F410 Panic disorder [episodic paroxysmal anxiety] without agoraphobia: Secondary | ICD-10-CM | POA: Diagnosis not present

## 2019-12-12 DIAGNOSIS — F331 Major depressive disorder, recurrent, moderate: Secondary | ICD-10-CM | POA: Diagnosis not present

## 2019-12-19 DIAGNOSIS — N200 Calculus of kidney: Secondary | ICD-10-CM | POA: Diagnosis not present

## 2019-12-20 DIAGNOSIS — N200 Calculus of kidney: Secondary | ICD-10-CM | POA: Diagnosis not present

## 2019-12-26 ENCOUNTER — Ambulatory Visit: Payer: Medicare PPO | Admitting: Internal Medicine

## 2019-12-26 ENCOUNTER — Other Ambulatory Visit: Payer: Self-pay

## 2019-12-26 ENCOUNTER — Encounter: Payer: Self-pay | Admitting: Internal Medicine

## 2019-12-26 DIAGNOSIS — M79604 Pain in right leg: Secondary | ICD-10-CM

## 2019-12-26 DIAGNOSIS — I480 Paroxysmal atrial fibrillation: Secondary | ICD-10-CM | POA: Diagnosis not present

## 2019-12-26 DIAGNOSIS — I10 Essential (primary) hypertension: Secondary | ICD-10-CM

## 2019-12-26 DIAGNOSIS — M545 Low back pain: Secondary | ICD-10-CM

## 2019-12-26 DIAGNOSIS — F339 Major depressive disorder, recurrent, unspecified: Secondary | ICD-10-CM | POA: Diagnosis not present

## 2019-12-26 DIAGNOSIS — C8330 Diffuse large B-cell lymphoma, unspecified site: Secondary | ICD-10-CM | POA: Diagnosis not present

## 2019-12-26 MED ORDER — HYDROCODONE-ACETAMINOPHEN 7.5-325 MG PO TABS: 1.0000 | ORAL_TABLET | Freq: Four times a day (QID) | ORAL | 0 refills | Status: DC | PRN

## 2019-12-26 NOTE — Progress Notes (Signed)
Subjective:  Patient ID: [name], male    DOB: 1946/03/02  Age: 74 y.o. MRN: [id number]  CC: Follow-up (3 MONTH )   HPI JOJI KIRCHGESSNER presents for CAD, LBP, depression f/u  Outpatient Medications Prior to Visit  Medication Sig Dispense Refill  . acetaminophen (TYLENOL) 500 MG tablet Take 1,000 mg by mouth daily as needed for moderate pain.    [name] alfuzosin (UROXATRAL) 10 MG 24 hr tablet Take 10 mg by mouth at bedtime.     [name] amoxicillin (AMOXIL) 500 MG capsule Take 2,000 mg by mouth See admin instructions. Take 4 capsules (2000 mg) by mouth 1 hour prior to dental work    . amoxicillin-clavulanate (AUGMENTIN) 500-125 MG tablet Take 1 tablet by mouth daily with supper.    [name] aspirin EC 81 MG tablet Take 81 mg by mouth daily.    [name] atorvastatin (LIPITOR) 80 MG tablet Take 1 tablet (80 mg total) by mouth daily. 90 tablet 2  . BELSOMRA 15 MG TABS TAKE 1 TABLET BY MOUTH  DAILY (Patient taking differently: Take 1 tablet by mouth at bedtime as needed. ) 90 tablet 1  . buPROPion (WELLBUTRIN XL) 300 MG 24 hr tablet TAKE 1 TABLET BY MOUTH  DAILY WITH BREAKFAST (Patient taking differently: Take 300 mg by mouth daily before breakfast. ) 90 tablet 3  . Calcium Citrate-Vitamin D (CALCIUM CITRATE + D PO) Take 1 tablet by mouth 2 (two) times daily.     . Cholecalciferol (VITAMIN D-3 PO) Take 2,000 Units by mouth 2 (two) times daily.     . clonazePAM (KLONOPIN) 1 MG tablet TAKE 1 TABLET BY MOUTH  EVERY 6 HOURS AS NEEDED FOR ANXIETY (Patient taking differently: Take 1 mg by mouth every 6 (six) hours as needed. ) 360 tablet 1  . denosumab (PROLIA) 60 MG/ML SOSY injection Inject 60 mg as directed every 6 (six) months. Last injection: Feb 2020 Next injection: August 2020    . diltiazem (CARDIZEM CD) 240 MG 24 hr capsule TAKE 1 CAPSULE BY MOUTH  DAILY (Patient taking differently: Take 240 mg by mouth daily. ) 90 capsule 3  . DULoxetine (CYMBALTA) 60 MG capsule TAKE 1 CAPSULE BY MOUTH  TWICE DAILY (Patient  taking differently: Take 60 mg by mouth 2 (two) times daily. ) 180 capsule 3  . ezetimibe (ZETIA) 10 MG tablet Take 1 tablet (10 mg total) by mouth daily. 90 tablet 2  . ferrous sulfate 325 (65 FE) MG tablet Take 325 mg by mouth 2 (two) times daily with a meal.     . hydrochlorothiazide (HYDRODIURIL) 12.5 MG tablet Take 1 tablet (12.5 mg total) by mouth daily. 90 tablet 3  . HYDROcodone-acetaminophen (NORCO) 7.5-325 MG tablet Take 1 tablet by mouth every 6 (six) hours as needed for severe pain. 30 tablet 0  . isosorbide mononitrate (IMDUR) 30 MG 24 hr tablet Take 1 tablet (30 mg total) by mouth daily. (Patient taking differently: Take 30 mg by mouth daily with lunch. ) 90 tablet 3  . levothyroxine (SYNTHROID) 200 MCG tablet TAKE 1 TABLET BY MOUTH  DAILY AT 6 AM (Patient taking differently: Take 200 mcg by mouth daily before breakfast. Pt takes w/ the 62mcg to = 246mcg every morning at 0500) 90 tablet 3  . levothyroxine (SYNTHROID) 25 MCG tablet TAKE 1 TABLET BY MOUTH  DAILY BEFORE BREAKFAST (Patient taking differently: Take 25 mcg by mouth daily before breakfast. ) 90 tablet 3  . loratadine (CLARITIN) 10 MG tablet  Take 10 mg by mouth daily.     [name] losartan (COZAAR) 50 MG tablet Take 1 tablet (50 mg total) by mouth daily. 90 tablet 3  . Magnesium 500 MG CAPS Take 1 capsule (500 mg total) by mouth daily. (Patient taking differently: Take 500 mg by mouth every evening. ) 30 capsule 1  . metoprolol tartrate (LOPRESSOR) 25 MG tablet Take 1 tablet (25 mg total) by mouth 2 (two) times daily. 180 tablet 2  . mirabegron ER (MYRBETRIQ) 25 MG TB24 tablet Take 25 mg by mouth at bedtime.     . Multiple Vitamin (MULTIVITAMIN WITH MINERALS) TABS tablet Take 1 tablet by mouth daily. Men's One-A-Day 52+    . nitroGLYCERIN (NITROSTAT) 0.4 MG SL tablet Place 0.4 mg under the tongue every 5 (five) minutes as needed for chest pain. x3 doses as needed for chest pain    . omeprazole (PRILOSEC) 40 MG capsule TAKE 1 CAPSULE BY  MOUTH&nbsp;&nbsp;DAILY BEFORE BREAKFAST 90 capsule 3  . OTEZLA 30 MG TABS Take 30 mg by mouth 2 (two) times a day.     . Probiotic Product (PROBIOTIC PO) Take 1 capsule by mouth daily with breakfast.    . rivaroxaban (XARELTO) 10 MG TABS tablet Take 1 tablet (10 mg total) by mouth daily. 90 tablet 1  . testosterone enanthate (DELATESTRYL) 200 MG/ML injection INJECT 1ML (200MG ) INTO THE MUSCLE EVERY 14 DAYS. DISCARD VIAL AFTER 28 DAYS. 5 mL 3  . tiZANidine (ZANAFLEX) 4 MG tablet TAKE 1 TABLET BY MOUTH;EVERY 6 HOURS AS NEEDED FOR MUSCLE SPASM(S) 360 tablet 1  . vitamin C (ASCORBIC ACID) 500 MG tablet Take 500 mg by mouth daily.    [name] EPINEPHrine (EPIPEN 2-PAK) 0.3 mg/0.3 mL IJ SOAJ injection Inject 0.3 mg into the muscle daily as needed (for anaphylactic reaction).    . cefUROXime (CEFTIN) 500 MG tablet Take 1 tablet (500 mg total) by mouth 2 (two) times daily with a meal. 8 tablet 0   No facility-administered medications prior to visit.    ROS: Review of Systems  Constitutional: Negative for appetite change, fatigue and unexpected weight change.  HENT: Negative for congestion, nosebleeds, sneezing, sore throat and trouble swallowing.   Eyes: Negative for itching and visual disturbance.  Respiratory: Negative for cough.   Cardiovascular: Negative for chest pain, palpitations and leg swelling.  Gastrointestinal: Negative for abdominal distention, blood in stool, diarrhea and nausea.  Genitourinary: Negative for frequency and hematuria.  Musculoskeletal: Positive for arthralgias, back pain and gait problem. Negative for joint swelling and neck pain.  Skin: Negative for rash.  Neurological: Negative for dizziness, tremors, speech difficulty and weakness.  Psychiatric/Behavioral: Negative for agitation, dysphoric mood and sleep disturbance. The patient is not nervous/anxious.     Objective:  BP (!) 144/78 (BP Location: Right Arm)   Pulse 77   Temp 97.8 F (36.6 C) (Oral)   Wt 285 lb 3.2 oz  (129.4 kg)   SpO2 98%   BMI 34.72 kg/m   BP Readings from Last 3 Encounters:  12/26/19 (!) 144/78  11/06/19 130/78  11/05/19 (!) 143/85    Wt Readings from Last 3 Encounters:  12/26/19 285 lb 3.2 oz (129.4 kg)  11/06/19 283 lb 12.8 oz (128.7 kg)  11/05/19 285 lb 3.2 oz (129.4 kg)    Physical Exam Constitutional:      General: He is not in acute distress.    Appearance: He is well-developed. He is obese.     Comments: NAD  Eyes:  Conjunctiva/sclera: Conjunctivae normal.     Pupils: Pupils are equal, round, and reactive to light.  Neck:     Thyroid: No thyromegaly.     Vascular: No JVD.  Cardiovascular:     Rate and Rhythm: Normal rate and regular rhythm.     Heart sounds: Normal heart sounds. No murmur. No friction rub. No gallop.   Pulmonary:     Effort: Pulmonary effort is normal. No respiratory distress.     Breath sounds: Normal breath sounds. No wheezing or rales.  Chest:     Chest wall: No tenderness.  Abdominal:     General: Bowel sounds are normal. There is no distension.     Palpations: Abdomen is soft. There is no mass.     Tenderness: There is no abdominal tenderness. There is no guarding or rebound.  Musculoskeletal:        General: Tenderness and deformity present. Normal range of motion.     Cervical back: Normal range of motion.     Right lower leg: Edema present.     Left lower leg: Edema present.  Lymphadenopathy:     Cervical: No cervical adenopathy.  Skin:    General: Skin is warm and dry.     Findings: No rash.  Neurological:     Mental Status: He is alert and oriented to person, place, and time.     Cranial Nerves: No cranial nerve deficit.     Motor: No abnormal muscle tone.     Coordination: Coordination normal.     Gait: Gait abnormal.     Deep Tendon Reflexes: Reflexes are normal and symmetric.  Psychiatric:        Behavior: Behavior normal.        Thought Content: Thought content normal.        Judgment: Judgment normal.    LS  spine w/pain walker  Lab Results  Component Value Date   WBC 6.0 11/05/2019   HGB 13.9 11/05/2019   HCT 43.3 11/05/2019   PLT 198 11/05/2019   GLUCOSE 123 (H) 11/05/2019   CHOL 115 10/17/2019   TRIG 47 10/17/2019   HDL 55 10/17/2019   LDLCALC 49 10/17/2019   ALT 30 11/05/2019   AST 22 11/05/2019   NA 140 11/05/2019   K 4.1 11/05/2019   CL 101 11/05/2019   CREATININE 1.14 11/05/2019   BUN 22 11/05/2019   CO2 30 11/05/2019   TSH 0.130 (L) 11/09/2018   PSA 1.01 10/02/2018   INR 1.0 03/26/2019   HGBA1C 5.8 (H) 02/06/2013    No results found.  Assessment & Plan:    Walker Kehr, MD

## 2019-12-26 NOTE — Patient Instructions (Signed)
Merrell slip ons

## 2019-12-31 NOTE — Assessment & Plan Note (Signed)
Xarelto

## 2019-12-31 NOTE — Assessment & Plan Note (Signed)
Cymbalta 

## 2019-12-31 NOTE — Assessment & Plan Note (Signed)
In remission.

## 2019-12-31 NOTE — Assessment & Plan Note (Signed)
Norco prn  Potential benefits of a long term opioids use as well as potential risks (i.e. addiction risk, apnea etc) and complications (i.e. Somnolence, constipation and others) were explained to the patient and were aknowledged. 

## 2019-12-31 NOTE — Assessment & Plan Note (Signed)
Losartan 

## 2020-01-04 DIAGNOSIS — J3089 Other allergic rhinitis: Secondary | ICD-10-CM | POA: Diagnosis not present

## 2020-01-04 DIAGNOSIS — J301 Allergic rhinitis due to pollen: Secondary | ICD-10-CM | POA: Diagnosis not present

## 2020-01-09 DIAGNOSIS — F331 Major depressive disorder, recurrent, moderate: Secondary | ICD-10-CM | POA: Diagnosis not present

## 2020-01-09 DIAGNOSIS — F410 Panic disorder [episodic paroxysmal anxiety] without agoraphobia: Secondary | ICD-10-CM | POA: Diagnosis not present

## 2020-01-10 DIAGNOSIS — B351 Tinea unguium: Secondary | ICD-10-CM | POA: Diagnosis not present

## 2020-01-10 DIAGNOSIS — I872 Venous insufficiency (chronic) (peripheral): Secondary | ICD-10-CM | POA: Diagnosis not present

## 2020-02-04 ENCOUNTER — Inpatient Hospital Stay: Payer: Medicare PPO | Admitting: Hematology

## 2020-02-04 ENCOUNTER — Inpatient Hospital Stay: Payer: Medicare PPO | Attending: Hematology

## 2020-02-04 ENCOUNTER — Other Ambulatory Visit: Payer: Self-pay | Admitting: Cardiovascular Disease

## 2020-02-04 ENCOUNTER — Other Ambulatory Visit: Payer: Self-pay

## 2020-02-04 VITALS — BP 148/78 | HR 74 | Temp 98.7°F | Resp 20 | Ht 76.0 in | Wt 286.4 lb

## 2020-02-04 DIAGNOSIS — Z8572 Personal history of non-Hodgkin lymphomas: Secondary | ICD-10-CM | POA: Insufficient documentation

## 2020-02-04 DIAGNOSIS — C8330 Diffuse large B-cell lymphoma, unspecified site: Secondary | ICD-10-CM

## 2020-02-04 DIAGNOSIS — J3089 Other allergic rhinitis: Secondary | ICD-10-CM | POA: Diagnosis not present

## 2020-02-04 DIAGNOSIS — J301 Allergic rhinitis due to pollen: Secondary | ICD-10-CM | POA: Diagnosis not present

## 2020-02-04 LAB — CBC WITH DIFFERENTIAL/PLATELET
Abs Immature Granulocytes: 0.02 10*3/uL (ref 0.00–0.07)
Basophils Absolute: 0 10*3/uL (ref 0.0–0.1)
Basophils Relative: 0 %
Eosinophils Absolute: 0.1 10*3/uL (ref 0.0–0.5)
Eosinophils Relative: 2 %
HCT: 49 % (ref 39.0–52.0)
Hemoglobin: 15.9 g/dL (ref 13.0–17.0)
Immature Granulocytes: 0 %
Lymphocytes Relative: 27 %
Lymphs Abs: 1.4 10*3/uL (ref 0.7–4.0)
MCH: 30.1 pg (ref 26.0–34.0)
MCHC: 32.4 g/dL (ref 30.0–36.0)
MCV: 92.6 fL (ref 80.0–100.0)
Monocytes Absolute: 0.4 10*3/uL (ref 0.1–1.0)
Monocytes Relative: 7 %
Neutro Abs: 3.2 10*3/uL (ref 1.7–7.7)
Neutrophils Relative %: 64 %
Platelets: 142 10*3/uL — ABNORMAL LOW (ref 150–400)
RBC: 5.29 MIL/uL (ref 4.22–5.81)
RDW: 15.7 % — ABNORMAL HIGH (ref 11.5–15.5)
WBC: 5.1 10*3/uL (ref 4.0–10.5)
nRBC: 0 % (ref 0.0–0.2)

## 2020-02-04 LAB — CMP (CANCER CENTER ONLY)
ALT: 40 U/L (ref 0–44)
AST: 30 U/L (ref 15–41)
Albumin: 3.6 g/dL (ref 3.5–5.0)
Alkaline Phosphatase: 57 U/L (ref 38–126)
Anion gap: 10 (ref 5–15)
BUN: 23 mg/dL (ref 8–23)
CO2: 28 mmol/L (ref 22–32)
Calcium: 9.2 mg/dL (ref 8.9–10.3)
Chloride: 103 mmol/L (ref 98–111)
Creatinine: 1.1 mg/dL (ref 0.61–1.24)
GFR, Est AFR Am: >60 mL/min (ref >60)
GFR, Estimated: >60 mL/min (ref >60)
Glucose, Bld: 126 mg/dL — ABNORMAL HIGH (ref 70–99)
Potassium: 4.3 mmol/L (ref 3.5–5.1)
Sodium: 141 mmol/L (ref 135–145)
Total Bilirubin: 1.2 mg/dL (ref 0.3–1.2)
Total Protein: 6.9 g/dL (ref 6.5–8.1)

## 2020-02-04 LAB — LACTATE DEHYDROGENASE: LDH: 159 U/L (ref 98–192)

## 2020-02-04 NOTE — Progress Notes (Signed)
HEMATOLOGY/ONCOLOGY CLINIC NOTE  Date of Service: 02/04/2020  Patient Care Team: Plotnikov, Evie Lacks, MD as PCP - General (Internal Medicine) Skeet Latch, MD as PCP - Cardiology (Cardiology) Brunetta Genera, MD as Consulting Physician (Hematology) McKenzie, Candee Furbish, MD as Consulting Physician (Urology) Jackquline Denmark, MD as Consulting Physician (Gastroenterology) Skeet Latch, MD as Attending Physician (Cardiology)  CHIEF COMPLAINTS/PURPOSE OF CONSULTATION:  Follow Up for Diffuse Large B-Cell Lymphoma   HISTORY OF PRESENTING ILLNESS:   [name] is a wonderful 74 y.o. male who has been referred to [place] by Dr. Lew Dawes for evaluation and management of Diffuse Large B-Cell Lymphoma. He is accompanied today by his partner. The pt reports that he is doing well overall.   The pt reports that he presented to ENT Dr. Pollie Friar care after developing a cough and a new nodule in his mouth. He notes that the symptoms presented about a month ago and have not progressed or significantly worsened. He notes that he has frequent sinus drainage, and felt that this enlargement could initially be related to this. He denies any changes in his voice and difficulty breathing.   The pt notes that he is finishing three months of PT tomorrow after surgeries on his right leg and left knee. He notes that he takes blood thinners for Afib and uses his CPAP regularly now. The pt also receives testosterone injections, 200mg  every other week. The pt notes that he had several surgeries bilaterally to remove large kidney stones in the past. He notes that his thyroid replacement has been stable. His vertebral compression fracture was in 2013, after a fall in which he also broke his right femur and wrist. The pt also takes Prolia every 6 months for his osteoporosis. He has had 18 surgeries on his right leg.   He notes that his psoriasis has been stable and takes Enbrel twice a week, and has  taken this for 5 years. He was on 10 years of Humara before this. He was on Methotrexate prior to [facility].   The pt notes that he functions okay at home and is able to drive himself and cook for himself and feels that he can function independently with limitations on lifting heavier objects.   On review of systems, pt reports recent cough, left tonsil mass, stable energy levels, and denies fevers, chills, night sweats, unexpected weight loss, difficulty breathing, difficulty swallowing, noticing any other lumps or bumps, pain along the spine, recent infections, abdominal pains, difficulty urinating, leg swelling, and any other symptoms.  . On Social Hx the pt reports that he smoked cigarettes for many years until he quit 6 years ago, and drinks ETOH socially.    INTERVAL HISTORY   [name] is here for management and evaluation, after completing 6 planned cycles of R-CHOP treatmenfollowup and active surveillance of his Diffuse Large B-Cell Lymphoma. The patient's last visit with [place] was on 11/05/2019. The pt reports that he is doing well overall.  The pt reports that he has lost 50 lbs with effort and is walking almost every day. He denies any falls in the last 3 months. Pt is still using his walker in his home, but is working on moving independently. Pt has been using his CPAP more regularly. He has had both doses of the COVID19 vaccine and denies any issues.   Pt has been having some issues with his Psoriasis because he is having difficulty remembering to use his topical twice per day, but  has continued taking Otezla as prescribed.   Lab results today (02/04/20) of CBC w/diff and CMP is as follows: all values are WNL except for RDW at 15.7, PLT at 142K, Glucose at 126. 02/04/2020 LDH at 159  On review of systems, pt denies unexpected weight loss, abdominal pain, joint pain, leg swelling and any other symptoms.    MEDICAL HISTORY:  Past Medical History:  Diagnosis Date  .  Anticoagulant long-term use    xarelto--- takes for AFib,  managed by pcp, Dr Alain Marion  . Benign localized prostatic hyperplasia with lower urinary tract symptoms (LUTS)   . CAD (coronary artery disease) cardiologist--  dr t. Oval Linsey   hx of known CAD obstruction w/ collaterals (cath done @ [facility] 12-31-2011) ;   last cardiac cath 08-13-2019  showed sig. 2V CAD involving proxLAD and CTO of the RCA/  chronic total occlusion midRCA w/ bridging and L>R collaterals  . Diastolic CHF ([place])    dx [id number] hospital admission (followed by pcp)  . Diffuse large B cell lymphoma [facility]) oncologist-- dr Irene Limbo--- in remission   dx 08/ 2019 -- bx left tonsill mass-- involving lymph nodes--- completed chemotherapy 10-12-2018  . DJD (degenerative joint disease)   . Dysthymic disorder   . Elevated brain natriuretic peptide (BNP) level   . Environmental allergies   . GAD (generalized anxiety disorder)   . GERD (gastroesophageal reflux disease)   . History of falling    multiple times  . History of Graves' disease    1987 s/p total thyroidectomy  . History of kidney stones   . History of scabies    07/ 2014  resolved  . History of syncope    per D/C note in epic 02-21-2013 ?vasovagal  . History of vertebral compression fracture    05/ 2014 --  L1, no surgical intervention  . HLD (hyperlipidemia)   . Hyperlipidemia   . Hypertension   . Hypogonadism in male   . IDA (iron deficiency anemia)   . Lumbago   . OA (osteoarthritis)   . OSA on CPAP    cpap machine-settings 17  . Osteoporosis    Severe  . Persistent atrial fibrillation [facility])    cardiologist--- dr r. Oval Linsey  . Plaque psoriasis    dermatologist--- dr Harriett Sine--- currently taking otezla  . Post-surgical hypothyroidism    followed by pcp---  s/p total thyroidectomy for graves disease in 1987  . Renal calculus, right   . Thrombocytopenia ([place])   . Unsteady gait    uses walker  . Wears hearing aid in both ears     SURGICAL  HISTORY: Past Surgical History:  Procedure Laterality Date  . APPENDECTOMY  2011 approx.   [name] CARDIAC CATHETERIZATION  12-31-2011   dr j. Beatrix Fetters @[facility]    normal LVF w/ multivessel CAD,  occluded RCA w/ colleterals  . CATARACT EXTRACTION W/ INTRAOCULAR LENS  IMPLANT, BILATERAL  2016 approx.  . COLONOSCOPY    . CYSTOSCOPY WITH RETROGRADE PYELOGRAM, URETEROSCOPY AND STENT PLACEMENT Left 11/07/2017   Procedure: CYSTOSCOPY WITH RETROGRADE PYELOGRAM, URETEROSCOPY AND STENT PLACEMENT;  Surgeon: Cleon Gustin, MD;  Location: [facility];  Service: Urology;  Laterality: Left;  . CYSTOSCOPY WITH RETROGRADE PYELOGRAM, URETEROSCOPY AND STENT PLACEMENT Right 10/22/2019   Procedure: CYSTOSCOPY WITH RETROGRADE PYELOGRAM, URETEROSCOPY AND STENT PLACEMENT;  Surgeon: Cleon Gustin, MD;  Location: [facility];  Service: Urology;  Laterality: Right;  . CYSTOSCOPY/URETEROSCOPY/HOLMIUM LASER/STENT PLACEMENT Right 03/11/2017   Procedure: CYSTOSCOPY/URETEROSCOPYSTENT PLACEMENT right ureter  retrograde pylegram;  Surgeon: Cleon Gustin, MD;  [facility];  Service: Urology;  Laterality: Right;  . HARDWARE REMOVAL  10/05/2012   Procedure: HARDWARE REMOVAL;  Surgeon: [name], MD;  [facility];  Service: Orthopedics;  Laterality: Right;  REMOVING  STRYKER  GAMMA NAIL  . HARDWARE REMOVAL Right 07/03/2013   Procedure: HARDWARE REMOVAL RIGHT TIBIA ;  Surgeon: Rozanna Box, MD;  Location: [place];  Service: Orthopedics;  Laterality: Right;  . HIP CLOSED REDUCTION Right 10/15/2013   Procedure: CLOSED MANIPULATION HIP;  Surgeon: [name], MD;  [facility];  Service: Orthopedics;  Laterality: Right;  . HOLMIUM LASER APPLICATION Right [id number]   Procedure: HOLMIUM LASER APPLICATION;  Surgeon: Cleon Gustin, MD;  [facility];  Service: Urology;  Laterality: Right;  . HOLMIUM LASER APPLICATION Left [id number]   Procedure: HOLMIUM LASER APPLICATION;  Surgeon: Cleon Gustin, MD;  [facility];  Service: Urology;  Laterality: Left;  . HOLMIUM LASER APPLICATION Left [id number]   Procedure: HOLMIUM LASER APPLICATION;  Surgeon: Cleon Gustin, MD;  [facility];  Service: Urology;  Laterality: Left;  . HOLMIUM LASER APPLICATION Right [id number]   Procedure: HOLMIUM LASER APPLICATION;  Surgeon: Cleon Gustin, MD;  Location: [facility];  Service: Urology;  Laterality: Right;  . INCISION AND DRAINAGE HIP Right 11/16/2013   Procedure: IRRIGATION AND DEBRIDEMENT RIGHT HIP;  Surgeon: [name], MD;  [facility];  Service: Orthopedics;  Laterality: Right;  . INTRAVASCULAR PRESSURE WIRE/FFR STUDY N/A 08/13/2019   Procedure: INTRAVASCULAR PRESSURE WIRE/FFR STUDY;  Surgeon: Nelva Bush, MD;  Location: [place] [facility];  Service: Cardiovascular;  Laterality: N/A;  . IR IMAGING GUIDED PORT INSERTION  06/22/2018  . IR NEPHROSTOMY PLACEMENT LEFT  03/28/2019  . IR URETERAL STENT LEFT NEW ACCESS W/O SEP NEPHROSTOMY CATH  10/24/2017  . IR URETERAL STENT LEFT NEW ACCESS W/O SEP NEPHROSTOMY CATH  03/26/2019  . NEPHROLITHOTOMY Right 02/08/2017   Procedure: NEPHROLITHOTOMY PERCUTANEOUS WITH SURGEON ACCESS;  Surgeon: Cleon Gustin, MD;  [facility];  Service: Urology;  Laterality: Right;  . NEPHROLITHOTOMY Left 10/24/2017   Procedure: NEPHROLITHOTOMY PERCUTANEOUS;  Surgeon: Cleon Gustin, MD;  [facility];  Service: Urology;  Laterality: Left;  . NEPHROLITHOTOMY Left 03/26/2019   Procedure: NEPHROLITHOTOMY PERCUTANEOUS;  Surgeon: Cleon Gustin, MD;  [facility];  Service: Urology;  Laterality: Left;  2 HRS  . NEPHROLITHOTOMY Left 04/17/2019   Procedure: NEPHROLITHOTOMY PERCUTANEOUS;  Surgeon: Cleon Gustin, MD;  [facility];  Service: Urology;  Laterality: Left;  2 HRS  . ORIF TIBIA FRACTURE Right 02/06/2013   Procedure: OPEN REDUCTION INTERNAL FIXATION (ORIF) TIBIA FRACTURE WITH IM ROD FIBULA;  Surgeon:  Rozanna Box, MD;  Location: [place];  Service: Orthopedics;  Laterality: Right;  . ORIF TIBIA FRACTURE Right 07/03/2013   Procedure: RIGHT TIBIA NON UNION REPAIR ;  Surgeon: Rozanna Box, MD;  Location: [place];  Service: Orthopedics;  Laterality: Right;  . ORIF WRIST FRACTURE  10/02/2012   Procedure: OPEN REDUCTION INTERNAL FIXATION (ORIF) WRIST FRACTURE;  Surgeon: Roseanne Kaufman, MD;  [facility];  Service: Orthopedics;  Laterality: Right;  WITH   ANTIBIOTIC  CEMENT  . ORIF WRIST FRACTURE Left 10/28/2013   Procedure: OPEN REDUCTION INTERNAL FIXATION (ORIF) WRIST FRACTURE with allograft;  Surgeon: Roseanne Kaufman, MD;  [facility];  Service: Orthopedics;  Laterality: Left;  DVR Plate  .  QUADRICEPS TENDON REPAIR Left 07/15/2017   Procedure: REPAIR QUADRICEP TENDON;  Surgeon: Frederik Pear, MD;  Location: [place];  Service: Orthopedics;  Laterality: Left;  . RIGHT/LEFT HEART CATH AND CORONARY ANGIOGRAPHY N/A 08/13/2019   Procedure: RIGHT/LEFT HEART CATH AND CORONARY ANGIOGRAPHY;  Surgeon: Nelva Bush, MD;  Location: [place] [facility];  Service: Cardiovascular;  Laterality: N/A;  . THYROIDECTOMY  02/1986  . TOTAL HIP ARTHROPLASTY Right 03-16-2016   @[facility]   . TOTAL HIP REVISION  10/05/2012   Procedure: TOTAL HIP REVISION;  Surgeon: [name], MD;  [facility];  Service: Orthopedics;  Laterality: Right;  RIGHT TOTAL HIP REVISION  . TOTAL HIP REVISION Right 09/17/2013   Procedure: REVISION RIGHT TOTAL HIP ARTHROPLASTY ;  Surgeon: [name], MD;  [facility];  Service: Orthopedics;  Laterality: Right;  . TOTAL HIP REVISION Right 10/26/2013   Procedure: REVISION RIGHT TOTAL HIP ARTHROPLASTY;  Surgeon: [name], MD;  [facility];  Service: Orthopedics;  Laterality: Right;  . TOTAL KNEE ARTHROPLASTY Bilateral right 03-15-2011;  left 06-30-2011  . TOTAL KNEE REVISION Left 04/11/2017   Procedure: TOTAL KNEE REVISION PATELLA and TIBIA;  Surgeon: Frederik Pear, MD;   Location: [place];  Service: Orthopedics;  Laterality: Left;    SOCIAL HISTORY: Social History   Socioeconomic History  . Marital status: Married    Spouse name: Not on file  . Number of children: Not on file  . Years of education: Not on file  . Highest education level: Not on file  Occupational History  . Not on file  Tobacco Use  . Smoking status: Former Smoker    Packs/day: 1.00    Years: 50.00    Pack years: 50.00    Types: Cigarettes    Quit date: 01/12/2012    Years since quitting: 8.0  . Smokeless tobacco: Never Used  Substance and Sexual Activity  . Alcohol use: Not Currently    Comment: occasional-social  . Drug use: Never  . Sexual activity: Yes  Other Topics Concern  . Not on file  Social History Narrative   [place] 2.5 months, went home Feb 21st slipped and fell on back and developed.  Home PT/OT.  Just started outpatient physical therapy.  Friday night, misstepped.     Social Determinants of Health   Financial Resource Strain:   . Difficulty of Paying Living Expenses:   Food Insecurity:   . Worried About Charity fundraiser in the Last Year:   . Arboriculturist in the Last Year:   Transportation Needs:   . Film/video editor (Medical):   [name] Lack of Transportation (Non-Medical):   Physical Activity:   . Days of Exercise per Week:   . Minutes of Exercise per Session:   Stress:   . Feeling of Stress :   Social Connections:   . Frequency of Communication with Friends and Family:   . Frequency of Social Gatherings with Friends and Family:   . Attends Religious Services:   . Active Member of Clubs or Organizations:   . Attends Archivist Meetings:   [name] Marital Status:   Intimate Partner Violence:   . Fear of Current or Ex-Partner:   . Emotionally Abused:   [name] Physically Abused:   . Sexually Abused:     FAMILY HISTORY: Family History  Problem Relation Age of Onset  . CAD Father 76  . Asthma Father   . Alcohol abuse Father   .  Arthritis  Mother   . Alcohol abuse Sister     ALLERGIES:  is allergic to short ragweed pollen ext.  MEDICATIONS:  Current Outpatient Medications  Medication Sig Dispense Refill  . acetaminophen (TYLENOL) 500 MG tablet Take 1,000 mg by mouth daily as needed for moderate pain.    [name] alfuzosin (UROXATRAL) 10 MG 24 hr tablet Take 10 mg by mouth at bedtime.     [name] amoxicillin (AMOXIL) 500 MG capsule Take 2,000 mg by mouth See admin instructions. Take 4 capsules (2000 mg) by mouth 1 hour prior to dental work    . amoxicillin-clavulanate (AUGMENTIN) 500-125 MG tablet Take 1 tablet by mouth daily with supper.    [name] aspirin EC 81 MG tablet Take 81 mg by mouth daily.    [name] atorvastatin (LIPITOR) 80 MG tablet Take 1 tablet (80 mg total) by mouth daily. 90 tablet 2  . BELSOMRA 15 MG TABS TAKE 1 TABLET BY MOUTH  DAILY (Patient taking differently: Take 1 tablet by mouth at bedtime as needed. ) 90 tablet 1  . buPROPion (WELLBUTRIN XL) 300 MG 24 hr tablet TAKE 1 TABLET BY MOUTH  DAILY WITH BREAKFAST (Patient taking differently: Take 300 mg by mouth daily before breakfast. ) 90 tablet 3  . Calcium Citrate-Vitamin D (CALCIUM CITRATE + D PO) Take 1 tablet by mouth 2 (two) times daily.     . Cholecalciferol (VITAMIN D-3 PO) Take 2,000 Units by mouth 2 (two) times daily.     . clonazePAM (KLONOPIN) 1 MG tablet TAKE 1 TABLET BY MOUTH  EVERY 6 HOURS AS NEEDED FOR ANXIETY (Patient taking differently: Take 1 mg by mouth every 6 (six) hours as needed. ) 360 tablet 1  . denosumab (PROLIA) 60 MG/ML SOSY injection Inject 60 mg as directed every 6 (six) months. Last injection: Feb 2020 Next injection: August 2020    . diltiazem (CARDIZEM CD) 240 MG 24 hr capsule TAKE 1 CAPSULE BY MOUTH  DAILY (Patient taking differently: Take 240 mg by mouth daily. ) 90 capsule 3  . DULoxetine (CYMBALTA) 60 MG capsule TAKE 1 CAPSULE BY MOUTH  TWICE DAILY (Patient taking differently: Take 60 mg by mouth 2 (two) times daily. ) 180 capsule 3  . ezetimibe  (ZETIA) 10 MG tablet Take 1 tablet (10 mg total) by mouth daily. 90 tablet 2  . ferrous sulfate 325 (65 FE) MG tablet Take 325 mg by mouth 2 (two) times daily with a meal.     . hydrochlorothiazide (HYDRODIURIL) 12.5 MG tablet Take 1 tablet (12.5 mg total) by mouth daily. 90 tablet 3  . isosorbide mononitrate (IMDUR) 30 MG 24 hr tablet Take 1 tablet (30 mg total) by mouth daily. (Patient taking differently: Take 30 mg by mouth daily with lunch. ) 90 tablet 3  . levothyroxine (SYNTHROID) 200 MCG tablet TAKE 1 TABLET BY MOUTH  DAILY AT 6 AM (Patient taking differently: Take 200 mcg by mouth daily before breakfast. Pt takes w/ the 34mcg to = 233mcg every morning at 0500) 90 tablet 3  . levothyroxine (SYNTHROID) 25 MCG tablet TAKE 1 TABLET BY MOUTH  DAILY BEFORE BREAKFAST (Patient taking differently: Take 25 mcg by mouth daily before breakfast. ) 90 tablet 3  . loratadine (CLARITIN) 10 MG tablet Take 10 mg by mouth daily.     [name] losartan (COZAAR) 50 MG tablet Take 1 tablet (50 mg total) by mouth daily. 90 tablet 3  . Magnesium 500 MG CAPS Take 1 capsule (500 mg total)  by mouth daily. (Patient taking differently: Take 500 mg by mouth every evening. ) 30 capsule 1  . metoprolol tartrate (LOPRESSOR) 25 MG tablet Take 1 tablet (25 mg total) by mouth 2 (two) times daily. 180 tablet 2  . mirabegron ER (MYRBETRIQ) 25 MG TB24 tablet Take 25 mg by mouth at bedtime.     . Multiple Vitamin (MULTIVITAMIN WITH MINERALS) TABS tablet Take 1 tablet by mouth daily. Men's One-A-Day 36+    . omeprazole (PRILOSEC) 40 MG capsule TAKE 1 CAPSULE BY MOUTH  DAILY BEFORE BREAKFAST 90 capsule 3  . OTEZLA 30 MG TABS Take 30 mg by mouth 2 (two) times a day.     . Probiotic Product (PROBIOTIC PO) Take 1 capsule by mouth daily with breakfast.    . rivaroxaban (XARELTO) 10 MG TABS tablet Take 1 tablet (10 mg total) by mouth daily. 90 tablet 1  . tiZANidine (ZANAFLEX) 4 MG tablet TAKE 1 TABLET BY MOUTH;EVERY 6 HOURS AS NEEDED FOR MUSCLE  SPASM(S) 360 tablet 1  . vitamin C (ASCORBIC ACID) 500 MG tablet Take 500 mg by mouth daily.    [name] EPINEPHrine (EPIPEN 2-PAK) 0.3 mg/0.3 mL IJ SOAJ injection Inject 0.3 mg into the muscle daily as needed (for anaphylactic reaction).    [name] HYDROcodone-acetaminophen (NORCO) 7.5-325 MG tablet Take 1 tablet by mouth every 6 (six) hours as needed for severe pain. (Patient not taking: Reported on 02/04/2020) 120 tablet 0  . nitroGLYCERIN (NITROSTAT) 0.4 MG SL tablet Place 0.4 mg under the tongue every 5 (five) minutes as needed for chest pain. x3 doses as needed for chest pain    . testosterone enanthate (DELATESTRYL) 200 MG/ML injection INJECT 1ML (200MG ) INTO THE MUSCLE EVERY 14 DAYS. DISCARD VIAL AFTER 28 DAYS. 5 mL 3   No current facility-administered medications for this visit.    REVIEW OF SYSTEMS:   A 10+ POINT REVIEW OF SYSTEMS WAS OBTAINED including neurology, dermatology, psychiatry, cardiac, respiratory, lymph, extremities, GI, GU, Musculoskeletal, constitutional, breasts, reproductive, HEENT.  All pertinent positives are noted in the HPI.  All others are negative.   PHYSICAL EXAMINATION: SKIN: chronic venous stasis changes in BLE, psoriasis rashes on elbows have improved with Otezla   ECOG FS:3 - Symptomatic, >50% confined to bed  Vitals:   02/04/20 1159  BP: (!) 148/78  Pulse: 74  Resp: 20  Temp: 98.7 F (37.1 C)  SpO2: 96%   Wt Readings from Last 3 Encounters:  02/04/20 286 lb 6.4 oz (129.9 kg)  12/26/19 285 lb 3.2 oz (129.4 kg)  11/06/19 283 lb 12.8 oz (128.7 kg)    Body mass index is 34.86 kg/m.    Exam was given in a chair   GENERAL:alert, in no acute distress and comfortable SKIN: no acute rashes, no significant lesions EYES: conjunctiva are pink and non-injected, sclera anicteric OROPHARYNX: MMM, no exudates, no oropharyngeal erythema or ulceration NECK: supple, no JVD LYMPH:  no palpable lymphadenopathy in the cervical, axillary or inguinal regions LUNGS: clear  to auscultation b/l with normal respiratory effort HEART: regular rate & rhythm ABDOMEN:  normoactive bowel sounds , non tender, not distended. No palpable hepatosplenomegaly.  Extremity: no pedal edema PSYCH: alert & oriented x 3 with fluent speech NEURO: no focal motor/sensory deficits  LABORATORY DATA:  I have reviewed the data as listed  . CBC Latest Ref Rng & Units 02/04/2020 11/05/2019 10/22/2019  WBC 4.0 - 10.5 K/uL 5.1 6.0 -  Hemoglobin 13.0 - 17.0 g/dL 15.9 13.9 15.6  Hematocrit 39.0 - 52.0 % 49.0 43.3 46.0  Platelets 150 - 400 K/uL 142(L) 198 -    . CMP Latest Ref Rng & Units 02/04/2020 11/05/2019 10/22/2019  Glucose 70 - 99 mg/dL 126(H) 123(H) 127(H)  BUN 8 - 23 mg/dL 23 22 27(H)  Creatinine 0.61 - 1.24 mg/dL 1.10 1.14 1.30(H)  Sodium 135 - 145 mmol/L 141 140 138  Potassium 3.5 - 5.1 mmol/L 4.3 4.1 3.6  Chloride 98 - 111 mmol/L 103 101 99  CO2 22 - 32 mmol/L 28 30 -  Calcium 8.9 - 10.3 mg/dL 9.2 9.3 -  Total Protein 6.5 - 8.1 g/dL 6.9 7.0 -  Total Bilirubin 0.3 - 1.2 mg/dL 1.2 0.6 -  Alkaline Phos 38 - 126 U/L 57 82 -  AST 15 - 41 U/L 30 22 -  ALT 0 - 44 U/L 40 30 -   . Lab Results  Component Value Date   LDH 159 02/04/2020   06/28/2019 ECHOCARDIOGRAM COMPLETE (Accession [id number])   05/29/18 Left tonsil biopsy:   RADIOGRAPHIC STUDIES: I have personally reviewed the radiological images as listed and agreed with the findings in the report. No results found.  ASSESSMENT & PLAN:  74 y.o. male with  1. Diffuse Large B-Cell Lymphoma,- Stage III Left tonsil -05/29/18 Left tonsil biopsy which revealed Diffuse Large B-Cell Lymphoma, which requires treatment  -06/07/18 LDH at 214, will monitor change through treatment.  -Baseline ECHO 06/14/18: EF 50-55% -Standard regimen of R-CHOP with G-CSF support for 6 cycles starting 06/23/18  06/16/18 PET/CT revealed 1. Deauville 5 hypermetabolic lesions in the palatine tonsils, left internal jugular chain, and involving a right  inguinal lymph node. Deauville 4 involvement of a left inguinal lymph node and an enlarged subcutaneous lesion favoring lymph node superficial to the lower right sternocleidomastoid muscle in the neck. 2. Other imaging findings of potential clinical significance: Aortic Atherosclerosis. Coronary atherosclerosis. Emphysema. Bilateral nonobstructive nephrolithiasis. Umbilical hernia contains adipose tissues. Old right rib fractures some of which are nonunited. 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   08/22/18 PET/CT revealed Interval response to therapy. Overall Deauville criteria 3. Interval decrease in size and FDG uptake associated with palatine tonsil lesions. There has been resolution of previous left level-II and bilateral inguinal lymph nodes. 2. Enlarged subcutaneous lesion superficial to the lower right sternocleidomastoid muscle exhibits decreased FDG uptake. Currently Deauville criteria 2. 3. No new or progressive sites of disease identified. 4.  Aortic Atherosclerosis. 5. Multi vessel coronary artery atherosclerotic calcifications. 6. Unchanged vertebra plana deformity. 7. Kidney stones.   S/p 6 cycles of R-CHOP completed on 10/12/18  11/22/18 PET/CT revealed The tonsillar mass has essentially resolved. The large presumed lymph node superficial to the right sternocleidomastoid is stable in size at 2.3 cm in short axis, with maximum SUV of 3.3 (formerly 2.4), representing Deauville 3 activity, previously Deauville 2. 2. Other imaging findings of potential clinical significance: Aortic Atherosclerosis and Emphysema. Coronary atherosclerosis. Bilateral nonobstructive nephrolithiasis. Umbilical hernia contains adipose tissue. Chronic high activity posterior to the right hip implant along a region of bony irregularity and deformity probably reflecting chronic inflammation. L1 vertebra plana.  2.  Patient Active Problem List   Diagnosis Date Noted  . CHF (congestive heart failure) ([place]) 06/21/2019  . H/O nephrolithotomy  with removal of calculi 03/26/2019  . Renal calculus 03/26/2019  . Generalized weakness 11/08/2018  . Well adult exam 10/02/2018  . Counseling regarding advance care planning and goals of care 07/09/2018  . Diffuse large B-cell lymphoma ([place]) 06/23/2018  .  Port-A-Cath in place 06/23/2018  . Hypogonadism in male 11/22/2017  . Nephrolithiasis 10/24/2017  . Recurrent dislocation of left patella 07/15/2017  . Recurrent subluxation of patella, left 07/08/2017  . S/P revision of total knee 04/11/2017  . Failed total knee, left, initial encounter ([place]) 04/09/2017  . Renal calculus, right 02/08/2017  . Protein-calorie malnutrition, severe ([place]) 12/08/2016  . Chronic obstructive pulmonary disease ([place]) 12/08/2016  . Staghorn renal calculus 11/05/2016  . Wound drainage right hip 11/15/2013  . Expected blood loss anemia 09/18/2013  . Hyponatremia 09/18/2013  . S/P right TH revision 09/17/2013  . Nonunion, fracture, Right tibia  07/03/2013  . UTI (urinary tract infection) 07/03/2013  . Pathological fracture due to osteoporosis with delayed healing 04/24/2013  . Osteoporosis with fracture 04/24/2013  . Rash and nonspecific skin eruption 03/08/2013  . Syncope 02/21/2013  . PAF (paroxysmal atrial fibrillation) ([place]) 02/21/2013  . Chronic anticoagulation 02/21/2013  . Fall 02/08/2013  . Physical deconditioning 02/08/2013  . Low back pain radiating to both legs 02/08/2013  . Compression fracture of L1 lumbar vertebra ([facility]) 02/08/2013  . Right tibial fracture 02/05/2013  . Obesity, unspecified 02/05/2013  . Osteomyelitis of right wrist ([place]) 11/02/2012  . Anemia 10/06/2012  . HTN (hypertension) 10/03/2012  . Psoriasis 10/03/2012  . Dyslipidemia 10/03/2012  . Vitamin D deficiency 10/03/2012  . GERD (gastroesophageal reflux disease) 10/03/2012  . Hyperglycemia 10/03/2012  . Anxiety 10/03/2012  . Depression 10/03/2012  . CAD (coronary artery disease) 10/03/2012  . DJD (degenerative joint  disease) 10/03/2012  . Chronic gingivitis 10/03/2012  . Low testosterone, possible hypogonadism 10/01/2012  . Normocytic anemia 09/30/2012  . Right femoral fracture ([place]) 09/29/2012  . Right forearm fracture 09/29/2012  . Atrial fibrillation ([place]) 09/29/2012  . Hypothyroidism 09/29/2012  . History of recurrent UTIs 09/29/2012   PLAN:  -Discussed pt labwork today, 02/04/20; blood counts look good, blood chemistries are normal, LDH is WNL  -Pt is now nearly 16 months out from treatment (10/12/2018) -No clinical or lab evidence of lymphoma recurrence/progression. -Will continue 3-4 month follow-ups. Plan to move to 6 month follow-ups if labs/exams remain stable for 2 years post-treatment. -Continue to f/u with Dr. Oval Linsey for CHF and diuretic management -Continue to f/u with Dermatology as scheduled -Will see back in 4 months with labs    FOLLOW UP: RTC with Dr Irene Limbo with labs in 4 months   The total time spent in the appt was 20 minutes and more than 50% was on counseling and direct patient cares.  All of the patient's questions were answered with apparent satisfaction. The patient knows to call the clinic with any problems, questions or concerns.   Sullivan Lone MD [place] AAHIVMS [facility] Hematology/Oncology Physician [facility]  (Office):       [phone] (Work cell):  [phone] (Fax):           [phone]  02/04/2020 12:46 PM  I, Yevette Edwards, am acting as a scribe for Dr. Sullivan Lone.   .I have reviewed the above documentation for accuracy and completeness, and I agree with the above. Brunetta Genera MD

## 2020-02-05 ENCOUNTER — Telehealth: Payer: Self-pay | Admitting: Hematology

## 2020-02-05 LAB — BRAIN NATRIURETIC PEPTIDE: BNP: 138.1 pg/mL — ABNORMAL HIGH (ref 0.0–100.0)

## 2020-02-05 NOTE — Telephone Encounter (Signed)
Scheduled per 04/26 los, patient has been called and notified. 

## 2020-02-06 DIAGNOSIS — F331 Major depressive disorder, recurrent, moderate: Secondary | ICD-10-CM | POA: Diagnosis not present

## 2020-02-06 DIAGNOSIS — F410 Panic disorder [episodic paroxysmal anxiety] without agoraphobia: Secondary | ICD-10-CM | POA: Diagnosis not present

## 2020-02-08 ENCOUNTER — Other Ambulatory Visit: Payer: Self-pay

## 2020-02-08 ENCOUNTER — Encounter: Payer: Self-pay | Admitting: Cardiovascular Disease

## 2020-02-08 ENCOUNTER — Ambulatory Visit: Payer: Medicare PPO | Admitting: Cardiovascular Disease

## 2020-02-08 VITALS — BP 128/64 | HR 79 | Temp 97.7°F | Ht 76.0 in | Wt 282.0 lb

## 2020-02-08 DIAGNOSIS — I10 Essential (primary) hypertension: Secondary | ICD-10-CM

## 2020-02-08 DIAGNOSIS — I480 Paroxysmal atrial fibrillation: Secondary | ICD-10-CM | POA: Diagnosis not present

## 2020-02-08 DIAGNOSIS — I2510 Atherosclerotic heart disease of native coronary artery without angina pectoris: Secondary | ICD-10-CM

## 2020-02-08 NOTE — Progress Notes (Signed)
Cardiology Office Note   Date:  02/08/2020   ID:  Sheena, Wurm May 17, 1946, MRN [id number]  PCP:  Cassandria Anger, MD  Cardiologist:   Skeet Latch, MD   Chief Complaint  Patient presents with  . Follow-up    3 months.    History of Present Illness: KINGSLY SCURRY is a 74 y.o. male with medically managed moderate to severe CAD, long-standing-persistent atrial fibrillation, hypertension, hyperlipidemia, sleep apnea on CPAP, diffuse large B cell lymphoma (remission), hypothyroidism and prior tobacco abuse here for follow-up.  He was initially seen 05/2017 to establish care.  Mr. Mongar was previously a patient of [name] and last saw him 01/2016.  At that time he was preparing for left hip surgery.  [name] recommended stress testing but he declined.  He underwent surgery with no complications.  He was initially diagnosed with atrial fibrillation around 2012.  He was started on diltiazem and amiodarone.  He was noted to be in persistent atrial fibrillation.  Therefore amiodarone was discontinued. At the time of his atrial fibrillation diagnosis he also underwent a heart catheterization. He reportedly had 90% obstruction of an unknown vessel that had developed collaterals. He was started on Ranexa.  He had an echo 11/03/16 revealed LVEF of 60-65%. Severe left and right atrial enlargement was noted and the IVC was dilated.  He had no angina and so Ranexa was discontinued.  Consideration was made for starting a beta-blocker instead of a calcium channel blocker.  However we decided not to make too many changes at one time.  Mr. Cantu was diagnosed with diffuse large B cell lymphoma on 05/2018.  He completed 6 cycles of chemotherapy with rituximab, cyclophosphamide, doxorubicin, vincristine, and prednisone and is now in surveillance.  He had a baseline echo 06/2018 that revealed LVEF 50 to 55% and normal diastolic function.  He had apical hypokinesis and GLS was -13.9.  Heart rate was poorly controlled so metoprolol was added to his regimen.   He was seen in he ED and BNP was mildly elevated to 283.  He was started on lasix 20mg  daily and has noticed an improvement in his breathing.  He was referred for an echo 06/28/19 that revealed LVEF 50-55%.  GLS was -10.1%. The RV had mildly reduced systolic function.  He complained of frequent urination and his blood pressure was poorly controlled so Lasix was switched to hydrochlorothiazide.  There was concern that his symptoms could be due to ischemia.  He underwent left heart catheterization 08/2019.  He was found to have significant two-vessel CAD including sequential 60 and 70% mid LAD lesions and chronic total occlusion of the mid RCA.  There were bridging left to right collaterals.  Right atrial pressure was 13.  Pulmonary capillary wedge pressure was 20 to 25 mmHg.  There was a consideration for two-vessel CABG versus medical management.  He was not deemed to be a good candidate for surgery.  He was started on Imdur.  Since then he has not had any chest pain and his breathing has been stable.  He struggled with 3 kidney stones.    At his last appointment Mr. Dwain Sarna was struggling with frequent urination. Hydrochlorothiazide was reduced to 12.5 mg and losartan was increased.  He has now lost 42 lb which he attributes to the diuretic and walking more.  He hasn't had any falls since his last visit and he feels stronger on his feet.  His goal is to get off  the walker and using a cane.  He walks daily for about 20 minutes.  He continues to increase the time regularly. Overall he is feeling well.  He has no chest pain or shortness of breath.  He denies lower extremity edema, orthopnea or PND.  He continues to have nocturia.  He goes every hour.  He had both coronavirus vaccines and is happy to start getting bck to normal.  He saw Dr. Alain Marion and his BP was in the 140s.  When he checks it at home it is typically well-controlled.    Past  Medical History:  Diagnosis Date  . Anticoagulant long-term use    xarelto--- takes for AFib,  managed by pcp, Dr Alain Marion  . Benign localized prostatic hyperplasia with lower urinary tract symptoms (LUTS)   . CAD (coronary artery disease) cardiologist--  dr t. Oval Linsey   hx of known CAD obstruction w/ collaterals (cath done @ [facility] 12-31-2011) ;   last cardiac cath 08-13-2019  showed sig. 2V CAD involving proxLAD and CTO of the RCA/  chronic total occlusion midRCA w/ bridging and L>R collaterals  . Diastolic CHF ([place])    dx [id number] hospital admission (followed by pcp)  . Diffuse large B cell lymphoma [facility]) oncologist-- dr Irene Limbo--- in remission   dx 08/ 2019 -- bx left tonsill mass-- involving lymph nodes--- completed chemotherapy 10-12-2018  . DJD (degenerative joint disease)   . Dysthymic disorder   . Elevated brain natriuretic peptide (BNP) level   . Environmental allergies   . GAD (generalized anxiety disorder)   . GERD (gastroesophageal reflux disease)   . History of falling    multiple times  . History of Graves' disease    1987 s/p total thyroidectomy  . History of kidney stones   . History of scabies    07/ 2014  resolved  . History of syncope    per D/C note in epic 02-21-2013 ?vasovagal  . History of vertebral compression fracture    05/ 2014 --  L1, no surgical intervention  . HLD (hyperlipidemia)   . Hyperlipidemia   . Hypertension   . Hypogonadism in male   . IDA (iron deficiency anemia)   . Lumbago   . OA (osteoarthritis)   . OSA on CPAP    cpap machine-settings 17  . Osteoporosis    Severe  . Persistent atrial fibrillation [facility])    cardiologist--- dr r. Oval Linsey  . Plaque psoriasis    dermatologist--- dr Harriett Sine--- currently taking otezla  . Post-surgical hypothyroidism    followed by pcp---  s/p total thyroidectomy for graves disease in 1987  . Renal calculus, right   . Thrombocytopenia ([place])   . Unsteady gait    uses walker  . Wears hearing  aid in both ears     Past Surgical History:  Procedure Laterality Date  . APPENDECTOMY  2011 approx.   [name] CARDIAC CATHETERIZATION  12-31-2011   dr Lenna Sciara. Beatrix Fetters @[facility]    normal LVF w/ multivessel CAD,  occluded RCA w/ colleterals  . CATARACT EXTRACTION W/ INTRAOCULAR LENS  IMPLANT, BILATERAL  2016 approx.  . COLONOSCOPY    . CYSTOSCOPY WITH RETROGRADE PYELOGRAM, URETEROSCOPY AND STENT PLACEMENT Left 11/07/2017   Procedure: CYSTOSCOPY WITH RETROGRADE PYELOGRAM, URETEROSCOPY AND STENT PLACEMENT;  Surgeon: Cleon Gustin, MD;  Location: [facility];  Service: Urology;  Laterality: Left;  . CYSTOSCOPY WITH RETROGRADE PYELOGRAM, URETEROSCOPY AND STENT PLACEMENT Right 10/22/2019   Procedure: CYSTOSCOPY WITH RETROGRADE PYELOGRAM, URETEROSCOPY AND STENT PLACEMENT;  Surgeon: Cleon Gustin, MD;  Location: [facility];  Service: Urology;  Laterality: Right;  . CYSTOSCOPY/URETEROSCOPY/HOLMIUM LASER/STENT PLACEMENT Right 03/11/2017   Procedure: CYSTOSCOPY/URETEROSCOPYSTENT PLACEMENT right ureter retrograde pylegram;  Surgeon: Cleon Gustin, MD;  [facility];  Service: Urology;  Laterality: Right;  . HARDWARE REMOVAL  10/05/2012   Procedure: HARDWARE REMOVAL;  Surgeon: [name], MD;  [facility];  Service: Orthopedics;  Laterality: Right;  REMOVING  STRYKER  GAMMA NAIL  . HARDWARE REMOVAL Right 07/03/2013   Procedure: HARDWARE REMOVAL RIGHT TIBIA ;  Surgeon: Rozanna Box, MD;  Location: [place];  Service: Orthopedics;  Laterality: Right;  . HIP CLOSED REDUCTION Right 10/15/2013   Procedure: CLOSED MANIPULATION HIP;  Surgeon: [name], MD;  [facility];  Service: Orthopedics;  Laterality: Right;  . HOLMIUM LASER APPLICATION Right [id number]   Procedure: HOLMIUM LASER APPLICATION;  Surgeon: Cleon Gustin, MD;  [facility];  Service: Urology;  Laterality: Right;  . HOLMIUM LASER APPLICATION Left [id number]   Procedure: HOLMIUM LASER APPLICATION;  Surgeon:  Cleon Gustin, MD;  [facility];  Service: Urology;  Laterality: Left;  . HOLMIUM LASER APPLICATION Left [id number]   Procedure: HOLMIUM LASER APPLICATION;  Surgeon: Cleon Gustin, MD;  [facility];  Service: Urology;  Laterality: Left;  . HOLMIUM LASER APPLICATION Right [id number]   Procedure: HOLMIUM LASER APPLICATION;  Surgeon: Cleon Gustin, MD;  Location: [facility];  Service: Urology;  Laterality: Right;  . INCISION AND DRAINAGE HIP Right 11/16/2013   Procedure: IRRIGATION AND DEBRIDEMENT RIGHT HIP;  Surgeon: [name], MD;  [facility];  Service: Orthopedics;  Laterality: Right;  . INTRAVASCULAR PRESSURE WIRE/FFR STUDY N/A 08/13/2019   Procedure: INTRAVASCULAR PRESSURE WIRE/FFR STUDY;  Surgeon: Nelva Bush, MD;  Location: [place] [facility];  Service: Cardiovascular;  Laterality: N/A;  . IR IMAGING GUIDED PORT INSERTION  06/22/2018  . IR NEPHROSTOMY PLACEMENT LEFT  03/28/2019  . IR URETERAL STENT LEFT NEW ACCESS W/O SEP NEPHROSTOMY CATH  10/24/2017  . IR URETERAL STENT LEFT NEW ACCESS W/O SEP NEPHROSTOMY CATH  03/26/2019  . NEPHROLITHOTOMY Right 02/08/2017   Procedure: NEPHROLITHOTOMY PERCUTANEOUS WITH SURGEON ACCESS;  Surgeon: Cleon Gustin, MD;  [facility];  Service: Urology;  Laterality: Right;  . NEPHROLITHOTOMY Left 10/24/2017   Procedure: NEPHROLITHOTOMY PERCUTANEOUS;  Surgeon: Cleon Gustin, MD;  [facility];  Service: Urology;  Laterality: Left;  . NEPHROLITHOTOMY Left 03/26/2019   Procedure: NEPHROLITHOTOMY PERCUTANEOUS;  Surgeon: Cleon Gustin, MD;  [facility];  Service: Urology;  Laterality: Left;  2 HRS  . NEPHROLITHOTOMY Left 04/17/2019   Procedure: NEPHROLITHOTOMY PERCUTANEOUS;  Surgeon: Cleon Gustin, MD;  [facility];  Service: Urology;  Laterality: Left;  2 HRS  . ORIF TIBIA FRACTURE Right 02/06/2013   Procedure: OPEN REDUCTION INTERNAL FIXATION (ORIF) TIBIA FRACTURE WITH IM ROD  FIBULA;  Surgeon: Rozanna Box, MD;  Location: [place];  Service: Orthopedics;  Laterality: Right;  . ORIF TIBIA FRACTURE Right 07/03/2013   Procedure: RIGHT TIBIA NON UNION REPAIR ;  Surgeon: Rozanna Box, MD;  Location: [place];  Service: Orthopedics;  Laterality: Right;  . ORIF WRIST FRACTURE  10/02/2012   Procedure: OPEN REDUCTION INTERNAL FIXATION (ORIF) WRIST FRACTURE;  Surgeon: Roseanne Kaufman, MD;  [facility];  Service: Orthopedics;  Laterality: Right;  WITH   ANTIBIOTIC  CEMENT  . ORIF WRIST FRACTURE Left 10/28/2013  Procedure: OPEN REDUCTION INTERNAL FIXATION (ORIF) WRIST FRACTURE with allograft;  Surgeon: Roseanne Kaufman, MD;  [facility];  Service: Orthopedics;  Laterality: Left;  DVR Plate  . QUADRICEPS TENDON REPAIR Left 07/15/2017   Procedure: REPAIR QUADRICEP TENDON;  Surgeon: Frederik Pear, MD;  Location: [place];  Service: Orthopedics;  Laterality: Left;  . RIGHT/LEFT HEART CATH AND CORONARY ANGIOGRAPHY N/A 08/13/2019   Procedure: RIGHT/LEFT HEART CATH AND CORONARY ANGIOGRAPHY;  Surgeon: Nelva Bush, MD;  Location: [place] [facility];  Service: Cardiovascular;  Laterality: N/A;  . THYROIDECTOMY  02/1986  . TOTAL HIP ARTHROPLASTY Right 03-16-2016   @[facility]   . TOTAL HIP REVISION  10/05/2012   Procedure: TOTAL HIP REVISION;  Surgeon: [name], MD;  [facility];  Service: Orthopedics;  Laterality: Right;  RIGHT TOTAL HIP REVISION  . TOTAL HIP REVISION Right 09/17/2013   Procedure: REVISION RIGHT TOTAL HIP ARTHROPLASTY ;  Surgeon: [name], MD;  [facility];  Service: Orthopedics;  Laterality: Right;  . TOTAL HIP REVISION Right 10/26/2013   Procedure: REVISION RIGHT TOTAL HIP ARTHROPLASTY;  Surgeon: [name], MD;  [facility];  Service: Orthopedics;  Laterality: Right;  . TOTAL KNEE ARTHROPLASTY Bilateral right 03-15-2011;  left 06-30-2011  . TOTAL KNEE REVISION Left 04/11/2017   Procedure: TOTAL KNEE REVISION PATELLA and TIBIA;  Surgeon: Frederik Pear, MD;  Location: [place];  Service: Orthopedics;  Laterality: Left;     Current Outpatient Medications  Medication Sig Dispense Refill  . acetaminophen (TYLENOL) 500 MG tablet Take 1,000 mg by mouth daily as needed for moderate pain.    [name] alfuzosin (UROXATRAL) 10 MG 24 hr tablet Take 10 mg by mouth at bedtime.     [name] amoxicillin (AMOXIL) 500 MG capsule Take 2,000 mg by mouth See admin instructions. Take 4 capsules (2000 mg) by mouth 1 hour prior to dental work    . amoxicillin-clavulanate (AUGMENTIN) 500-125 MG tablet Take 1 tablet by mouth daily with supper.    [name] aspirin EC 81 MG tablet Take 81 mg by mouth daily.    [name] atorvastatin (LIPITOR) 80 MG tablet Take 1 tablet (80 mg total) by mouth daily. 90 tablet 2  . BELSOMRA 15 MG TABS TAKE 1 TABLET BY MOUTH  DAILY (Patient taking differently: Take 1 tablet by mouth at bedtime as needed. ) 90 tablet 1  . buPROPion (WELLBUTRIN XL) 300 MG 24 hr tablet TAKE 1 TABLET BY MOUTH  DAILY WITH BREAKFAST (Patient taking differently: Take 300 mg by mouth daily before breakfast. ) 90 tablet 3  . Calcium Citrate-Vitamin D (CALCIUM CITRATE + D PO) Take 1 tablet by mouth 2 (two) times daily.     . Cholecalciferol (VITAMIN D-3 PO) Take 2,000 Units by mouth 2 (two) times daily.     . clonazePAM (KLONOPIN) 1 MG tablet TAKE 1 TABLET BY MOUTH  EVERY 6 HOURS AS NEEDED FOR ANXIETY (Patient taking differently: Take 1 mg by mouth every 6 (six) hours as needed. ) 360 tablet 1  . denosumab (PROLIA) 60 MG/ML SOSY injection Inject 60 mg as directed every 6 (six) months. Last injection: Feb 2020 Next injection: August 2020    . diltiazem (CARDIZEM CD) 240 MG 24 hr capsule TAKE 1 CAPSULE BY MOUTH  DAILY (Patient taking differently: Take 240 mg by mouth daily. ) 90 capsule 3  . DULoxetine (CYMBALTA) 60 MG capsule TAKE 1 CAPSULE BY MOUTH  TWICE DAILY (Patient taking differently: Take 60 mg by mouth  2 (two) times daily. ) 180 capsule 3  . ezetimibe (ZETIA) 10 MG tablet Take 1  tablet (10 mg total) by mouth daily. 90 tablet 2  . ferrous sulfate 325 (65 FE) MG tablet Take 325 mg by mouth 2 (two) times daily with a meal.     . hydrochlorothiazide (HYDRODIURIL) 12.5 MG tablet Take 1 tablet (12.5 mg total) by mouth daily. 90 tablet 3  . HYDROcodone-acetaminophen (NORCO) 7.5-325 MG tablet Take 1 tablet by mouth every 6 (six) hours as needed for severe pain. 120 tablet 0  . isosorbide mononitrate (IMDUR) 30 MG 24 hr tablet Take 1 tablet (30 mg total) by mouth daily. (Patient taking differently: Take 30 mg by mouth daily with lunch. ) 90 tablet 3  . levothyroxine (SYNTHROID) 200 MCG tablet TAKE 1 TABLET BY MOUTH  DAILY AT 6 AM (Patient taking differently: Take 200 mcg by mouth daily before breakfast. Pt takes w/ the 20mcg to = 243mcg every morning at 0500) 90 tablet 3  . levothyroxine (SYNTHROID) 25 MCG tablet TAKE 1 TABLET BY MOUTH  DAILY BEFORE BREAKFAST (Patient taking differently: Take 25 mcg by mouth daily before breakfast. ) 90 tablet 3  . loratadine (CLARITIN) 10 MG tablet Take 10 mg by mouth daily.     . Magnesium 500 MG CAPS Take 1 capsule (500 mg total) by mouth daily. (Patient taking differently: Take 500 mg by mouth every evening. ) 30 capsule 1  . metoprolol tartrate (LOPRESSOR) 25 MG tablet Take 1 tablet (25 mg total) by mouth 2 (two) times daily. 180 tablet 2  . mirabegron ER (MYRBETRIQ) 25 MG TB24 tablet Take 25 mg by mouth at bedtime.     . Multiple Vitamin (MULTIVITAMIN WITH MINERALS) TABS tablet Take 1 tablet by mouth daily. Men's One-A-Day 53+    . nitroGLYCERIN (NITROSTAT) 0.4 MG SL tablet Place 0.4 mg under the tongue every 5 (five) minutes as needed for chest pain. x3 doses as needed for chest pain    . omeprazole (PRILOSEC) 40 MG capsule TAKE 1 CAPSULE BY MOUTH  DAILY BEFORE BREAKFAST 90 capsule 3  . OTEZLA 30 MG TABS Take 30 mg by mouth 2 (two) times a day.     . Probiotic Product (PROBIOTIC PO) Take 1 capsule by mouth daily with breakfast.    .  rivaroxaban (XARELTO) 10 MG TABS tablet Take 1 tablet (10 mg total) by mouth daily. 90 tablet 1  . testosterone enanthate (DELATESTRYL) 200 MG/ML injection INJECT 1ML (200MG ) INTO THE MUSCLE EVERY 14 DAYS. DISCARD VIAL AFTER 28 DAYS. 5 mL 3  . tiZANidine (ZANAFLEX) 4 MG tablet TAKE 1 TABLET BY MOUTH;EVERY 6 HOURS AS NEEDED FOR MUSCLE SPASM(S) 360 tablet 1  . vitamin C (ASCORBIC ACID) 500 MG tablet Take 500 mg by mouth daily.    [name] losartan (COZAAR) 50 MG tablet Take 1 tablet (50 mg total) by mouth daily. 90 tablet 3   No current facility-administered medications for this visit.    Allergies:   Short ragweed pollen ext    Social History:  The patient  reports that he quit smoking about 8 years ago. His smoking use included cigarettes. He has a 50.00 pack-year smoking history. He has never used smokeless tobacco. He reports previous alcohol use. He reports that he does not use drugs.   Family History:  The patient's family history includes Alcohol abuse in his father and sister; Arthritis in his mother; Asthma in his father; CAD (age of onset: 64) in his  father.    ROS:  Please see the history of present illness.   Otherwise, review of systems are positive for none.   All other systems are reviewed and negative.    PHYSICAL EXAM: VS:  BP 128/64 (BP Location: Left Arm, Patient Position: Sitting, Cuff Size: Large)   Pulse 79   Temp 97.7 F (36.5 C)   Ht 6\' 4"  (1.93 m)   Wt 282 lb (127.9 kg)   BMI 34.33 kg/m  , BMI Body mass index is 34.33 kg/m. GENERAL:  Well appearing HEENT: Pupils equal round and reactive, fundi not visualized, oral mucosa unremarkable NECK:  No jugular venous distention, waveform within normal limits, carotid upstroke brisk and symmetric, no bruits LUNGS:  Clear to auscultation bilaterally HEART:  Irregularly irregular.  PMI not displaced or sustained,S1 and S2 within normal limits, no S3, no S4, no clicks, no rubs, no murmurs ABD:  Flat, positive bowel sounds normal  in frequency in pitch, no bruits, no rebound, no guarding, no midline pulsatile mass, no hepatomegaly, no splenomegaly EXT:  2 plus pulses throughout, trace edema, no cyanosis no clubbing SKIN:  No rashes no nodules NEURO:  Cranial nerves II through XII grossly intact, motor grossly intact throughout PSYCH:  Cognitively intact, oriented to person place and time   EKG:  EKG is ordered today. The ekg ordered today demonstrates Atrial fibrillation. Rate 71 bpm. Left axis deviation. Nonspecific IVCD. 12/26/17: Atrial fibrillation.  Rate 95 bpm.  Cannot rule out prior anterior infarct. 07/04/18: Atrial fibrillation.  Rate 118 bpm.  Right axis deviation. 08/06/19: Atrial fibrillation.  Rate 86 bpm.  Prior anteroseptal infarct.  Poor R wave progression.  02/08/2020: Atrial fibrillation.  Rate 79 bpm.  LAD.  Prior anterior infarct.  Echo 11/03/16: Study Conclusions  - Left ventricle: Systolic function was normal. The estimated   ejection fraction was in the range of 60% to 65%. The study is   not technically sufficient to allow evaluation of LV diastolic   function. - Left atrium: Severely dilated. - Right atrium: Severely dilated.  Impressions:  - Technically difficult study with poor echo windows. The LVEF is   grossly normal at 60-65%, there is severe biatrial enlargment,   the IVC is dilated.  LHC 08/13/19: Conclusions: 1. Significant two-vessel coronary artery disease, including sequential 60% proximal and 70% mid LAD stenosis, which are hemodynamically significant by DFR and also involve large D2 branch, as well as chronic total occlusion of the mid RCA with bridging and left-to-right collaterals. 2. Mildly elevated left heart and pulmonary artery pressures.  Prominent v-waves noted on PCWP; question if mitral regurgitation is more severe than appreciated on recent transthoracic echocardiogram. 3. Moderately elevated right heart filling pressures. 4. Normal Fick cardiac  output/index.  Diagnostic Dominance: Right   RHC RA (mean): 13 mmHg RV (S/EDP): 49/14 mmHg PA (S/D, mean): 49/22 (31) mmHg PCWP (mean): 20-25 mmHg with prominent v-waves  Ao sat: 93% PA sat: 70%  Fick CO: 85 L/min Fick CI: 3.3 L/min/m^2  Recent Labs: 02/04/2020: ALT 40; BNP 138.1; BUN 23; Creatinine 1.10; Hemoglobin 15.9; Platelets 142; Potassium 4.3; Sodium 141    Lipid Panel    Component Value Date/Time   CHOL 115 10/17/2019 1127   TRIG 47 10/17/2019 1127   HDL 55 10/17/2019 1127   CHOLHDL 2.1 10/17/2019 1127   CHOLHDL 2 10/02/2018 1114   VLDL 16.8 10/02/2018 1114   LDLCALC 49 10/17/2019 1127      Wt Readings from Last 3 Encounters:  02/08/20 282 lb (127.9 kg)  02/04/20 286 lb 6.4 oz (129.9 kg)  12/26/19 285 lb 3.2 oz (129.4 kg)      ASSESSMENT AND PLAN:  # Angina: # CAD: # Hyperlipidemia: Mr. Delconte has obstructive coronary artery disease with evidence of collateralization. He is doing well with the addition of Imdur.  Continue with medical management.  He is on aspirin, atorvastatin, Zetia, metoprolol, and Imdur and doing well.  Lipids are at goal 10/2019.  # Hypertension:   # LE Edema:  Blood pressure stable.  Continue diltiazem, hydrochlorothiazide, Imdur, and metoprolol.  # Longstanding persistent atrial fibrillation:  He remains in atrial fibrillation with well-controlled rates.  Continue metoprolol, diltiazem, and Xarelto.  # Morbid obesity:   Mr. Kraszewski was congratulated on his exercise and weight loss.  He is doing fantastic.  # OSA: Continue CPAP   Current medicines are reviewed at length with the patient today.  The patient does not have concerns regarding medicines.  The following changes have been made: None  Labs/ tests ordered today include:   No orders of the defined types were placed in this encounter.    Disposition:   FU with Sherril Shipman C. Oval Linsey, MD, [facility] in 6 months.       Signed, Lorin Hauck C. Oval Linsey, MD, [facility]   02/08/2020 2:06 PM    [facility]

## 2020-02-08 NOTE — Patient Instructions (Signed)
Medication Instructions:  Your physician recommends that you continue on your current medications as directed. Please refer to the Current Medication list given to you today.  *If you need a refill on your cardiac medications before your next appointment, please call your pharmacy*  Lab Work: NONE  Testing/Procedures: NONE  Follow-Up: At [facility], you and your health needs are our priority.  As part of our continuing mission to provide you with exceptional heart care, we have created designated Provider Care Teams.  These Care Teams include your primary Cardiologist (physician) and Advanced Practice Providers (APPs -  Physician Assistants and Nurse Practitioners) who all work together to provide you with the care you need, when you need it.  We recommend signing up for the patient portal called "MyChart".  Sign up information is provided on this After Visit Summary.  MyChart is used to connect with patients for Virtual Visits (Telemedicine).  Patients are able to view lab/test results, encounter notes, upcoming appointments, etc.  Non-urgent messages can be sent to your provider as well.   To learn more about what you can do with MyChart, go to [email or web address].    Your next appointment:   6 month(s)  The format for your next appointment:   In Person  Provider:   You may see Tiffany Graham, MD or one of the following Advanced Practice Providers on your designated Care Team:    Luke Kilroy, PA-C  Callie Goodrich, PA-C  Jesse Cleaver, FNP   

## 2020-03-04 DIAGNOSIS — F410 Panic disorder [episodic paroxysmal anxiety] without agoraphobia: Secondary | ICD-10-CM | POA: Diagnosis not present

## 2020-03-04 DIAGNOSIS — J301 Allergic rhinitis due to pollen: Secondary | ICD-10-CM | POA: Diagnosis not present

## 2020-03-04 DIAGNOSIS — R3915 Urgency of urination: Secondary | ICD-10-CM | POA: Diagnosis not present

## 2020-03-04 DIAGNOSIS — J3089 Other allergic rhinitis: Secondary | ICD-10-CM | POA: Diagnosis not present

## 2020-03-04 DIAGNOSIS — N200 Calculus of kidney: Secondary | ICD-10-CM | POA: Diagnosis not present

## 2020-03-04 DIAGNOSIS — F331 Major depressive disorder, recurrent, moderate: Secondary | ICD-10-CM | POA: Diagnosis not present

## 2020-03-18 DIAGNOSIS — R3915 Urgency of urination: Secondary | ICD-10-CM | POA: Diagnosis not present

## 2020-03-25 DIAGNOSIS — R3915 Urgency of urination: Secondary | ICD-10-CM | POA: Diagnosis not present

## 2020-03-26 ENCOUNTER — Ambulatory Visit: Payer: Medicare PPO | Admitting: Internal Medicine

## 2020-03-26 ENCOUNTER — Encounter: Payer: Self-pay | Admitting: Internal Medicine

## 2020-03-26 ENCOUNTER — Other Ambulatory Visit: Payer: Self-pay

## 2020-03-26 VITALS — BP 136/60 | HR 84 | Temp 98.7°F | Ht 76.0 in | Wt 288.0 lb

## 2020-03-26 DIAGNOSIS — W19XXXA Unspecified fall, initial encounter: Secondary | ICD-10-CM

## 2020-03-26 DIAGNOSIS — I480 Paroxysmal atrial fibrillation: Secondary | ICD-10-CM | POA: Diagnosis not present

## 2020-03-26 DIAGNOSIS — E291 Testicular hypofunction: Secondary | ICD-10-CM

## 2020-03-26 DIAGNOSIS — N320 Bladder-neck obstruction: Secondary | ICD-10-CM | POA: Diagnosis not present

## 2020-03-26 DIAGNOSIS — I5032 Chronic diastolic (congestive) heart failure: Secondary | ICD-10-CM

## 2020-03-26 NOTE — Assessment & Plan Note (Signed)
On Testosterone  Potential benefits of a long term testosterone use as well as potential risks (BPH, MI, OSA, cancer)  and complications were explained to the patient and were aknowledged.

## 2020-03-26 NOTE — Progress Notes (Signed)
Subjective:  Patient ID: [name], male    DOB: 04-13-1946  Age: 74 y.o. MRN: [id number]  CC: No chief complaint on file.   HPI [name] presents for chronic pain/OA, BPH, dyslipidemia f/u  Outpatient Medications Prior to Visit  Medication Sig Dispense Refill  . acetaminophen (TYLENOL) 500 MG tablet Take 1,000 mg by mouth daily as needed for moderate pain.    [name] alfuzosin (UROXATRAL) 10 MG 24 hr tablet Take 10 mg by mouth at bedtime.     [name] amoxicillin (AMOXIL) 500 MG capsule Take 2,000 mg by mouth See admin instructions. Take 4 capsules (2000 mg) by mouth 1 hour prior to dental work    . amoxicillin-clavulanate (AUGMENTIN) 500-125 MG tablet Take 1 tablet by mouth daily with supper.    [name] aspirin EC 81 MG tablet Take 81 mg by mouth daily.    [name] atorvastatin (LIPITOR) 80 MG tablet Take 1 tablet (80 mg total) by mouth daily. 90 tablet 2  . BELSOMRA 15 MG TABS TAKE 1 TABLET BY MOUTH  DAILY (Patient taking differently: Take 1 tablet by mouth at bedtime as needed. ) 90 tablet 1  . buPROPion (WELLBUTRIN XL) 300 MG 24 hr tablet TAKE 1 TABLET BY MOUTH  DAILY WITH BREAKFAST (Patient taking differently: Take 300 mg by mouth daily before breakfast. ) 90 tablet 3  . Calcium Citrate-Vitamin D (CALCIUM CITRATE + D PO) Take 1 tablet by mouth 2 (two) times daily.     . Cholecalciferol (VITAMIN D-3 PO) Take 2,000 Units by mouth 2 (two) times daily.     . clonazePAM (KLONOPIN) 1 MG tablet TAKE 1 TABLET BY MOUTH  EVERY 6 HOURS AS NEEDED FOR ANXIETY (Patient taking differently: Take 1 mg by mouth every 6 (six) hours as needed. ) 360 tablet 1  . CRANBERRY PO Take 2 each by mouth in the morning, at noon, and at bedtime.    [name] denosumab (PROLIA) 60 MG/ML SOSY injection Inject 60 mg as directed every 6 (six) months. Last injection: Feb 2020 Next injection: August 2020    . diltiazem (CARDIZEM CD) 240 MG 24 hr capsule TAKE 1 CAPSULE BY MOUTH  DAILY (Patient taking differently: Take 240 mg by mouth daily.  ) 90 capsule 3  . DULoxetine (CYMBALTA) 60 MG capsule TAKE 1 CAPSULE BY MOUTH  TWICE DAILY (Patient taking differently: Take 60 mg by mouth 2 (two) times daily. ) 180 capsule 3  . ezetimibe (ZETIA) 10 MG tablet Take 1 tablet (10 mg total) by mouth daily. 90 tablet 2  . ferrous sulfate 325 (65 FE) MG tablet Take 325 mg by mouth 2 (two) times daily with a meal.     . hydrochlorothiazide (HYDRODIURIL) 12.5 MG tablet Take 1 tablet (12.5 mg total) by mouth daily. 90 tablet 3  . HYDROcodone-acetaminophen (NORCO) 7.5-325 MG tablet Take 1 tablet by mouth every 6 (six) hours as needed for severe pain. 120 tablet 0  . ipratropium (ATROVENT) 0.03 % nasal spray as needed.    . isosorbide mononitrate (IMDUR) 30 MG 24 hr tablet Take 1 tablet (30 mg total) by mouth daily. (Patient taking differently: Take 30 mg by mouth daily with lunch. ) 90 tablet 3  . levothyroxine (SYNTHROID) 200 MCG tablet TAKE 1 TABLET BY MOUTH  DAILY AT 6 AM (Patient taking differently: Take 200 mcg by mouth daily before breakfast. Pt takes w/ the 64mcg to = 254mcg every morning at 0500) 90 tablet 3  . levothyroxine (SYNTHROID) 25  MCG tablet TAKE 1 TABLET BY MOUTH  DAILY BEFORE BREAKFAST (Patient taking differently: Take 25 mcg by mouth daily before breakfast. ) 90 tablet 3  . loratadine (CLARITIN) 10 MG tablet Take 10 mg by mouth daily.     . Magnesium 500 MG CAPS Take 1 capsule (500 mg total) by mouth daily. (Patient taking differently: Take 500 mg by mouth every evening. ) 30 capsule 1  . metoprolol tartrate (LOPRESSOR) 25 MG tablet Take 1 tablet (25 mg total) by mouth 2 (two) times daily. 180 tablet 2  . mirabegron ER (MYRBETRIQ) 25 MG TB24 tablet Take 25 mg by mouth at bedtime.     . Multiple Vitamin (MULTIVITAMIN WITH MINERALS) TABS tablet Take 1 tablet by mouth daily. Men's One-A-Day 47+    . nitroGLYCERIN (NITROSTAT) 0.4 MG SL tablet Place 0.4 mg under the tongue every 5 (five) minutes as needed for chest pain. x3 doses as needed for  chest pain    . omeprazole (PRILOSEC) 40 MG capsule TAKE 1 CAPSULE BY MOUTH&nbsp;&nbsp;DAILY BEFORE BREAKFAST 90 capsule 3  . OTEZLA 30 MG TABS Take 30 mg by mouth 2 (two) times a day.     . Potassium Citrate 15 MEQ (1620 MG) TBCR Take 1 tablet by mouth daily.    . Probiotic Product (PROBIOTIC PO) Take 1 capsule by mouth daily with breakfast.    . rivaroxaban (XARELTO) 10 MG TABS tablet Take 1 tablet (10 mg total) by mouth daily. 90 tablet 1  . testosterone enanthate (DELATESTRYL) 200 MG/ML injection INJECT 1ML (200MG ) INTO THE MUSCLE EVERY 14 DAYS. DISCARD VIAL AFTER 28 DAYS. 5 mL 3  . tiZANidine (ZANAFLEX) 4 MG tablet TAKE 1 TABLET BY MOUTH;EVERY 6 HOURS AS NEEDED FOR MUSCLE SPASM(S) 360 tablet 1  . vitamin C (ASCORBIC ACID) 500 MG tablet Take 500 mg by mouth daily.    [name] losartan (COZAAR) 50 MG tablet Take 1 tablet (50 mg total) by mouth daily. 90 tablet 3   No facility-administered medications prior to visit.    ROS: Review of Systems  Constitutional: Negative for appetite change, fatigue and unexpected weight change.  HENT: Negative for congestion, nosebleeds, sneezing, sore throat and trouble swallowing.   Eyes: Negative for itching and visual disturbance.  Respiratory: Negative for cough.   Cardiovascular: Negative for chest pain, palpitations and leg swelling.  Gastrointestinal: Negative for abdominal distention, blood in stool, diarrhea and nausea.  Genitourinary: Negative for frequency and hematuria.  Musculoskeletal: Positive for arthralgias, back pain and gait problem. Negative for joint swelling and neck pain.  Skin: Negative for rash.  Neurological: Negative for dizziness, tremors, speech difficulty and weakness.  Psychiatric/Behavioral: Negative for agitation, dysphoric mood and sleep disturbance. The patient is not nervous/anxious.     Objective:  BP 136/60 (BP Location: Left Arm, Patient Position: Sitting, Cuff Size: Large)   Pulse 84   Temp 98.7 F (37.1 C) (Oral)    Ht 6\' 4"  (1.93 m)   Wt 288 lb (130.6 kg)   SpO2 95%   BMI 35.06 kg/m   BP Readings from Last 3 Encounters:  03/26/20 136/60  02/08/20 128/64  02/04/20 (!) 148/78    Wt Readings from Last 3 Encounters:  03/26/20 288 lb (130.6 kg)  02/08/20 282 lb (127.9 kg)  02/04/20 286 lb 6.4 oz (129.9 kg)    Physical Exam Constitutional:      General: He is not in acute distress.    Appearance: He is well-developed. He is obese.  Comments: NAD  Eyes:     Conjunctiva/sclera: Conjunctivae normal.     Pupils: Pupils are equal, round, and reactive to light.  Neck:     Thyroid: No thyromegaly.     Vascular: No JVD.  Cardiovascular:     Rate and Rhythm: Normal rate and regular rhythm.     Heart sounds: Normal heart sounds. No murmur heard.  No friction rub. No gallop.   Pulmonary:     Effort: Pulmonary effort is normal. No respiratory distress.     Breath sounds: Normal breath sounds. No wheezing or rales.  Chest:     Chest wall: No tenderness.  Abdominal:     General: Bowel sounds are normal. There is no distension.     Palpations: Abdomen is soft. There is no mass.     Tenderness: There is no abdominal tenderness. There is no guarding or rebound.  Musculoskeletal:        General: Swelling, tenderness and deformity present. Normal range of motion.     Cervical back: Normal range of motion.     Right lower leg: Edema present.     Left lower leg: Edema present.  Lymphadenopathy:     Cervical: No cervical adenopathy.  Skin:    General: Skin is warm and dry.     Findings: No rash.  Neurological:     Mental Status: He is alert and oriented to person, place, and time.     Cranial Nerves: No cranial nerve deficit.     Motor: No abnormal muscle tone.     Coordination: Coordination abnormal.     Gait: Gait normal.     Deep Tendon Reflexes: Reflexes are normal and symmetric.  Psychiatric:        Behavior: Behavior normal.        Thought Content: Thought content normal.         Judgment: Judgment normal.   walker  Lab Results  Component Value Date   WBC 5.1 02/04/2020   HGB 15.9 02/04/2020   HCT 49.0 02/04/2020   PLT 142 (L) 02/04/2020   GLUCOSE 126 (H) 02/04/2020   CHOL 115 10/17/2019   TRIG 47 10/17/2019   HDL 55 10/17/2019   LDLCALC 49 10/17/2019   ALT 40 02/04/2020   AST 30 02/04/2020   NA 141 02/04/2020   K 4.3 02/04/2020   CL 103 02/04/2020   CREATININE 1.10 02/04/2020   BUN 23 02/04/2020   CO2 28 02/04/2020   TSH 0.130 (L) 11/09/2018   PSA 1.01 10/02/2018   INR 1.0 03/26/2019   HGBA1C 5.8 (H) 02/06/2013    No results found.  Assessment & Plan:    Walker Kehr, MD

## 2020-03-26 NOTE — Assessment & Plan Note (Signed)
On Xarelto 

## 2020-03-26 NOTE — Assessment & Plan Note (Signed)
No relapse Better

## 2020-03-26 NOTE — Assessment & Plan Note (Signed)
Cont w/CPAP Lasix

## 2020-03-27 ENCOUNTER — Ambulatory Visit: Payer: Medicare PPO | Admitting: Internal Medicine

## 2020-03-31 ENCOUNTER — Other Ambulatory Visit: Payer: Self-pay

## 2020-03-31 MED ORDER — LEVOTHYROXINE SODIUM 200 MCG PO TABS: ORAL_TABLET | ORAL | 3 refills | Status: DC

## 2020-03-31 MED ORDER — LEVOTHYROXINE SODIUM 25 MCG PO TABS: ORAL_TABLET | ORAL | 3 refills | Status: DC

## 2020-04-01 DIAGNOSIS — R3915 Urgency of urination: Secondary | ICD-10-CM | POA: Diagnosis not present

## 2020-04-02 DIAGNOSIS — F410 Panic disorder [episodic paroxysmal anxiety] without agoraphobia: Secondary | ICD-10-CM | POA: Diagnosis not present

## 2020-04-02 DIAGNOSIS — F331 Major depressive disorder, recurrent, moderate: Secondary | ICD-10-CM | POA: Diagnosis not present

## 2020-04-08 DIAGNOSIS — J3089 Other allergic rhinitis: Secondary | ICD-10-CM | POA: Diagnosis not present

## 2020-04-08 DIAGNOSIS — R3915 Urgency of urination: Secondary | ICD-10-CM | POA: Diagnosis not present

## 2020-04-08 DIAGNOSIS — J301 Allergic rhinitis due to pollen: Secondary | ICD-10-CM | POA: Diagnosis not present

## 2020-04-10 DIAGNOSIS — B351 Tinea unguium: Secondary | ICD-10-CM | POA: Diagnosis not present

## 2020-04-15 DIAGNOSIS — R3915 Urgency of urination: Secondary | ICD-10-CM | POA: Diagnosis not present

## 2020-04-18 DIAGNOSIS — R3915 Urgency of urination: Secondary | ICD-10-CM | POA: Diagnosis not present

## 2020-04-24 ENCOUNTER — Other Ambulatory Visit: Payer: Self-pay

## 2020-04-24 ENCOUNTER — Telehealth: Payer: Self-pay

## 2020-04-24 NOTE — Telephone Encounter (Signed)
[place] Controlled Database Checked Last filled: 07/27/2019 (360) LOV w/you: 03/26/2020 Next appt w/you: 06/26/2020

## 2020-04-24 NOTE — Telephone Encounter (Signed)
Medication request already forwarded to PCP

## 2020-04-24 NOTE — Telephone Encounter (Signed)
New message    1.Medication Requested:clonazePAM (KLONOPIN) 1 MG tablet  2. Pharmacy (Name, [facility], [place], [place]  3. On Med List: Yes   4. Last Visit with PCP: 6.16.2021   5. Next visit date with PCP: 9.16.2021    Agent: Please be advised that RX refills may take up to 3 business days. We ask that you follow-up with your pharmacy.

## 2020-04-27 MED ORDER — BUPROPION HCL ER (XL) 300 MG PO TB24: 300.0000 mg | ORAL_TABLET | Freq: Every day | ORAL | 3 refills | Status: DC

## 2020-04-27 MED ORDER — CLONAZEPAM 1 MG PO TABS: 1.0000 mg | ORAL_TABLET | Freq: Four times a day (QID) | ORAL | 1 refills | Status: DC | PRN

## 2020-04-28 DIAGNOSIS — F410 Panic disorder [episodic paroxysmal anxiety] without agoraphobia: Secondary | ICD-10-CM | POA: Diagnosis not present

## 2020-04-28 DIAGNOSIS — F3341 Major depressive disorder, recurrent, in partial remission: Secondary | ICD-10-CM | POA: Diagnosis not present

## 2020-04-28 MED ORDER — DILTIAZEM HCL ER COATED BEADS 240 MG PO CP24: 240.0000 mg | ORAL_CAPSULE | Freq: Every day | ORAL | 3 refills | Status: DC

## 2020-04-28 MED ORDER — DULOXETINE HCL 60 MG PO CPEP: 60.0000 mg | ORAL_CAPSULE | Freq: Two times a day (BID) | ORAL | 3 refills | Status: DC

## 2020-05-06 DIAGNOSIS — J3089 Other allergic rhinitis: Secondary | ICD-10-CM | POA: Diagnosis not present

## 2020-05-06 DIAGNOSIS — J301 Allergic rhinitis due to pollen: Secondary | ICD-10-CM | POA: Diagnosis not present

## 2020-05-06 DIAGNOSIS — R3915 Urgency of urination: Secondary | ICD-10-CM | POA: Diagnosis not present

## 2020-05-08 DIAGNOSIS — R3915 Urgency of urination: Secondary | ICD-10-CM | POA: Diagnosis not present

## 2020-05-08 DIAGNOSIS — J3089 Other allergic rhinitis: Secondary | ICD-10-CM | POA: Diagnosis not present

## 2020-05-08 DIAGNOSIS — J301 Allergic rhinitis due to pollen: Secondary | ICD-10-CM | POA: Diagnosis not present

## 2020-05-08 IMAGING — PT NM PET TUM IMG INITIAL (PI) SKULL BASE T - THIGH
1 of 8 series · 2 of 25 positions shown · non-contrast
Comparison: Multiple exams, including CT abdomen from 03/10/2018

CLINICAL DATA: Initial treatment strategy for B-cell lymphoma.

EXAM:
NUCLEAR MEDICINE PET SKULL BASE TO THIGH
TECHNIQUE: 15.3 mCi F-18 FDG was injected intravenously. Full-ring PET imaging
was performed from the skull base to thigh after the radiotracer. CT
data was obtained and used for attenuation correction and anatomic
localization.
Fasting blood glucose: 94 mg/dl

[Series 4: ct sk_thigh 5.0 hd_fov · axial · 5.0mm · 1.27mm/px · z∈[-833,-321]mm · 2 of 242 slices shown]
[im 121/242  brain]
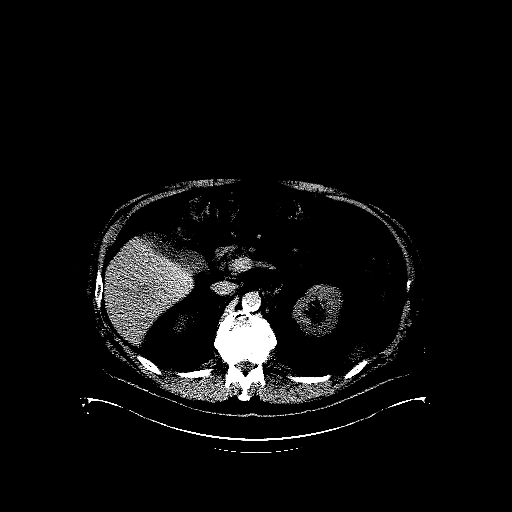
[im 242/242  brain]
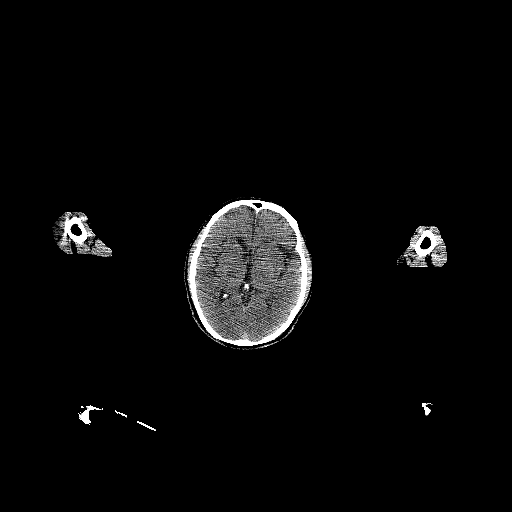

[2 of 25 positions shown; findings below may reference images not displayed]

FINDINGS: Mediastinal blood pool activity: SUV max

Background hepatic parenchymal activity: SUV max

NECK: Left palatine tonsillar mass 3.1 cm in diameter, maximum SUV
19.8. This may extend to involve the left lingual tonsil/left tongue
base.

Hypermetabolic right tonsillar activity with maximum SUV 9.8,
measuring 1.8 cm in long axis.

Left level IIa lymph node measures 1.0 cm in short axis on image
33/4 with maximum SUV 11.7.

Left level IIb lymph node measures 1.0 cm in short axis on image
32/4 with maximum SUV 13.0.

A presumed lymph node in the subcutaneous tissues superficial to the
right lower sternocleidomastoid muscle measures 2.3 cm in short axis
on image 41/4 and has a maximum SUV of 2.9.

Accentuated activity in the nose is likely incidental.

Incidental CT findings: Bilateral common carotid atherosclerotic
calcification.

CHEST: No significant abnormal hypermetabolic activity in this
region.

Incidental CT findings: Coronary, aortic arch, and branch vessel
atherosclerotic vascular disease. Paraseptal emphysema.

ABDOMEN/PELVIS: No splenomegaly or focal splenic lesion.

A right inguinal lymph node measuring 1.4 cm in short axis on image
219/4 has a maximum SUV of 13.3. A left inguinal lymph node
measuring 9 mm in short axis on image 220/4 has a maximum SUV of
4.4.

Incidental CT findings: Bilateral nonobstructive nephrolithiasis.
Aortoiliac atherosclerotic vascular disease. Umbilical hernia
contains adipose tissues.

SKELETON: There is a right total hip prosthesis with cerclage in
somewhat exuberant bone along the proximal stem, in addition to
intermediate density along the proximal stem above the bony region
with accentuated metabolic activity, maximum SUV in this vicinity
13.4. Strictly speaking I cannot exclude lymphomatous involvement in
this region.

Incidental CT findings: Old right rib fractures, some of which are
nonunited. Several scattered small sclerotic lesions in the skeleton
are nonspecific but not appreciably hypermetabolic. Vertebra plana
at L1.
IMPRESSION: 1. [HOSPITAL] 5 hypermetabolic lesions in the palatine tonsils, left
internal jugular chain, and involving a right inguinal lymph node.
[HOSPITAL] 4 involvement of a left inguinal lymph node and an
enlarged subcutaneous lesion favoring lymph node superficial to the
lower right sternocleidomastoid muscle in the neck.
2. Other imaging findings of potential clinical significance: Aortic
Atherosclerosis (RUQNF-88W.W). Coronary atherosclerosis. Emphysema
(RUQNF-UEF.7). Bilateral nonobstructive nephrolithiasis. Umbilical
hernia contains adipose tissues. Old right rib fractures some of
which are nonunited. Vertebra plana at L1.

## 2020-05-12 DIAGNOSIS — L309 Dermatitis, unspecified: Secondary | ICD-10-CM | POA: Diagnosis not present

## 2020-05-12 DIAGNOSIS — J300 Vasomotor rhinitis: Secondary | ICD-10-CM | POA: Diagnosis not present

## 2020-05-12 DIAGNOSIS — J4520 Mild intermittent asthma, uncomplicated: Secondary | ICD-10-CM | POA: Diagnosis not present

## 2020-05-12 DIAGNOSIS — J301 Allergic rhinitis due to pollen: Secondary | ICD-10-CM | POA: Diagnosis not present

## 2020-05-12 DIAGNOSIS — J3089 Other allergic rhinitis: Secondary | ICD-10-CM | POA: Diagnosis not present

## 2020-05-13 DIAGNOSIS — R3915 Urgency of urination: Secondary | ICD-10-CM | POA: Diagnosis not present

## 2020-05-15 DIAGNOSIS — J301 Allergic rhinitis due to pollen: Secondary | ICD-10-CM | POA: Diagnosis not present

## 2020-05-15 DIAGNOSIS — J3089 Other allergic rhinitis: Secondary | ICD-10-CM | POA: Diagnosis not present

## 2020-05-20 DIAGNOSIS — R3915 Urgency of urination: Secondary | ICD-10-CM | POA: Diagnosis not present

## 2020-05-20 DIAGNOSIS — J3081 Allergic rhinitis due to animal (cat) (dog) hair and dander: Secondary | ICD-10-CM | POA: Diagnosis not present

## 2020-05-20 DIAGNOSIS — J3089 Other allergic rhinitis: Secondary | ICD-10-CM | POA: Diagnosis not present

## 2020-05-20 DIAGNOSIS — J301 Allergic rhinitis due to pollen: Secondary | ICD-10-CM | POA: Diagnosis not present

## 2020-05-21 DIAGNOSIS — M810 Age-related osteoporosis without current pathological fracture: Secondary | ICD-10-CM | POA: Diagnosis not present

## 2020-05-27 DIAGNOSIS — J301 Allergic rhinitis due to pollen: Secondary | ICD-10-CM | POA: Diagnosis not present

## 2020-05-27 DIAGNOSIS — J3089 Other allergic rhinitis: Secondary | ICD-10-CM | POA: Diagnosis not present

## 2020-05-27 DIAGNOSIS — R3915 Urgency of urination: Secondary | ICD-10-CM | POA: Diagnosis not present

## 2020-05-27 DIAGNOSIS — R350 Frequency of micturition: Secondary | ICD-10-CM | POA: Diagnosis not present

## 2020-05-28 DIAGNOSIS — N200 Calculus of kidney: Secondary | ICD-10-CM | POA: Diagnosis not present

## 2020-05-28 DIAGNOSIS — R350 Frequency of micturition: Secondary | ICD-10-CM | POA: Diagnosis not present

## 2020-05-29 DIAGNOSIS — F3341 Major depressive disorder, recurrent, in partial remission: Secondary | ICD-10-CM | POA: Diagnosis not present

## 2020-05-29 DIAGNOSIS — J301 Allergic rhinitis due to pollen: Secondary | ICD-10-CM | POA: Diagnosis not present

## 2020-05-29 DIAGNOSIS — F410 Panic disorder [episodic paroxysmal anxiety] without agoraphobia: Secondary | ICD-10-CM | POA: Diagnosis not present

## 2020-05-29 DIAGNOSIS — J3089 Other allergic rhinitis: Secondary | ICD-10-CM | POA: Diagnosis not present

## 2020-06-03 DIAGNOSIS — R3915 Urgency of urination: Secondary | ICD-10-CM | POA: Diagnosis not present

## 2020-06-05 ENCOUNTER — Other Ambulatory Visit: Payer: Self-pay

## 2020-06-05 ENCOUNTER — Inpatient Hospital Stay: Payer: Medicare PPO

## 2020-06-05 ENCOUNTER — Inpatient Hospital Stay: Payer: Medicare PPO | Attending: Hematology

## 2020-06-05 ENCOUNTER — Inpatient Hospital Stay: Payer: Medicare PPO | Admitting: Hematology

## 2020-06-05 VITALS — BP 117/63 | HR 85 | Temp 95.7°F | Resp 18 | Ht 76.0 in | Wt 285.6 lb

## 2020-06-05 DIAGNOSIS — Z8572 Personal history of non-Hodgkin lymphomas: Secondary | ICD-10-CM | POA: Diagnosis not present

## 2020-06-05 DIAGNOSIS — E860 Dehydration: Secondary | ICD-10-CM | POA: Insufficient documentation

## 2020-06-05 DIAGNOSIS — C8330 Diffuse large B-cell lymphoma, unspecified site: Secondary | ICD-10-CM | POA: Diagnosis not present

## 2020-06-05 DIAGNOSIS — Z23 Encounter for immunization: Secondary | ICD-10-CM | POA: Diagnosis not present

## 2020-06-05 LAB — CMP (CANCER CENTER ONLY)
ALT: 53 U/L — ABNORMAL HIGH (ref 0–44)
AST: 38 U/L (ref 15–41)
Albumin: 3.2 g/dL — ABNORMAL LOW (ref 3.5–5.0)
Alkaline Phosphatase: 87 U/L (ref 38–126)
Anion gap: 6 (ref 5–15)
BUN: 35 mg/dL — ABNORMAL HIGH (ref 8–23)
CO2: 29 mmol/L (ref 22–32)
Calcium: 10.2 mg/dL (ref 8.9–10.3)
Chloride: 101 mmol/L (ref 98–111)
Creatinine: 1.45 mg/dL — ABNORMAL HIGH (ref 0.61–1.24)
GFR, Est AFR Am: 55 mL/min — ABNORMAL LOW (ref >60)
GFR, Estimated: 47 mL/min — ABNORMAL LOW (ref >60)
Glucose, Bld: 133 mg/dL — ABNORMAL HIGH (ref 70–99)
Potassium: 4.4 mmol/L (ref 3.5–5.1)
Sodium: 136 mmol/L (ref 135–145)
Total Bilirubin: 0.9 mg/dL (ref 0.3–1.2)
Total Protein: 6.9 g/dL (ref 6.5–8.1)

## 2020-06-05 LAB — CBC WITH DIFFERENTIAL/PLATELET
Abs Immature Granulocytes: 0.06 10*3/uL (ref 0.00–0.07)
Basophils Absolute: 0 10*3/uL (ref 0.0–0.1)
Basophils Relative: 1 %
Eosinophils Absolute: 0.3 10*3/uL (ref 0.0–0.5)
Eosinophils Relative: 4 %
HCT: 47 % (ref 39.0–52.0)
Hemoglobin: 15.5 g/dL (ref 13.0–17.0)
Immature Granulocytes: 1 %
Lymphocytes Relative: 24 %
Lymphs Abs: 2 10*3/uL (ref 0.7–4.0)
MCH: 30.9 pg (ref 26.0–34.0)
MCHC: 33 g/dL (ref 30.0–36.0)
MCV: 93.6 fL (ref 80.0–100.0)
Monocytes Absolute: 0.6 10*3/uL (ref 0.1–1.0)
Monocytes Relative: 8 %
Neutro Abs: 5.3 10*3/uL (ref 1.7–7.7)
Neutrophils Relative %: 62 %
Platelets: 160 10*3/uL (ref 150–400)
RBC: 5.02 MIL/uL (ref 4.22–5.81)
RDW: 13.7 % (ref 11.5–15.5)
WBC: 8.3 10*3/uL (ref 4.0–10.5)
nRBC: 0 % (ref 0.0–0.2)

## 2020-06-05 LAB — LACTATE DEHYDROGENASE: LDH: 242 U/L — ABNORMAL HIGH (ref 98–192)

## 2020-06-05 NOTE — Progress Notes (Signed)
HEMATOLOGY/ONCOLOGY CLINIC NOTE  Date of Service: 06/05/2020  Patient Care Team: Plotnikov, Evie Lacks, MD as PCP - General (Internal Medicine) Skeet Latch, MD as PCP - Cardiology (Cardiology) Brunetta Genera, MD as Consulting Physician (Hematology) McKenzie, Candee Furbish, MD as Consulting Physician (Urology) Jackquline Denmark, MD as Consulting Physician (Gastroenterology) Skeet Latch, MD as Attending Physician (Cardiology)  CHIEF COMPLAINTS/PURPOSE OF CONSULTATION:  Follow Up for Diffuse Large B-Cell Lymphoma   HISTORY OF PRESENTING ILLNESS:   [name] is a wonderful 74 y.o. male who has been referred to [place] by Dr. Lew Dawes for evaluation and management of Diffuse Large B-Cell Lymphoma. He is accompanied today by his partner. The pt reports that he is doing well overall.   The pt reports that he presented to ENT Dr. Pollie Friar care after developing a cough and a new nodule in his mouth. He notes that the symptoms presented about a month ago and have not progressed or significantly worsened. He notes that he has frequent sinus drainage, and felt that this enlargement could initially be related to this. He denies any changes in his voice and difficulty breathing.   The pt notes that he is finishing three months of PT tomorrow after surgeries on his right leg and left knee. He notes that he takes blood thinners for Afib and uses his CPAP regularly now. The pt also receives testosterone injections, 200mg  every other week. The pt notes that he had several surgeries bilaterally to remove large kidney stones in the past. He notes that his thyroid replacement has been stable. His vertebral compression fracture was in 2013, after a fall in which he also broke his right femur and wrist. The pt also takes Prolia every 6 months for his osteoporosis. He has had 18 surgeries on his right leg.   He notes that his psoriasis has been stable and takes Enbrel twice a week, and has  taken this for 5 years. He was on 10 years of Humara before this. He was on Methotrexate prior to [facility].   The pt notes that he functions okay at home and is able to drive himself and cook for himself and feels that he can function independently with limitations on lifting heavier objects.   On review of systems, pt reports recent cough, left tonsil mass, stable energy levels, and denies fevers, chills, night sweats, unexpected weight loss, difficulty breathing, difficulty swallowing, noticing any other lumps or bumps, pain along the spine, recent infections, abdominal pains, difficulty urinating, leg swelling, and any other symptoms.  . On Social Hx the pt reports that he smoked cigarettes for many years until he quit 6 years ago, and drinks ETOH socially.    INTERVAL HISTORY  [name] is here for management and evaluation, after completing 6 planned cycles of R-CHOP, for followup and active surveillance of his Diffuse Large B-Cell Lymphoma. The patient's last visit with [place] was on 02/04/2020. The pt reports that he is doing well overall.  The pt reports that he has felt well and continues to walk 3-4 times per week. He has noticed that the distance of his walks have gotten shorter recently, but attributes this to the heat. Pt is currently on a diuretic. Pt does not feel that his Psoriasis is well controlled at this time. He is taking [place]. He has an appointment with his Rheumatologist in a week.   Lab results today (06/05/20) of CBC w/diff and CMP is as follows: all values are WNL except  for Glucose at 133, BUN at 35, Creatinine at 1.45, Albumin at 3.2, ALT at 53, GFR Est Non Af Am at 47. 06/05/2020 LDH at 242  On review of systems, pt denies fevers, chills, night sweats, new lumps/bumps, new leg swelling and any other symptoms.   MEDICAL HISTORY:  Past Medical History:  Diagnosis Date  . Anticoagulant long-term use    xarelto--- takes for AFib,  managed by pcp, Dr Alain Marion  .  Benign localized prostatic hyperplasia with lower urinary tract symptoms (LUTS)   . CAD (coronary artery disease) cardiologist--  dr t. Oval Linsey   hx of known CAD obstruction w/ collaterals (cath done @ [facility] 12-31-2011) ;   last cardiac cath 08-13-2019  showed sig. 2V CAD involving proxLAD and CTO of the RCA/  chronic total occlusion midRCA w/ bridging and L>R collaterals  . Diastolic CHF ([place])    dx [phone] hospital admission (followed by pcp)  . Diffuse large B cell lymphoma [facility]) oncologist-- dr Irene Limbo--- in remission   dx 08/ 2019 -- bx left tonsill mass-- involving lymph nodes--- completed chemotherapy 10-12-2018  . DJD (degenerative joint disease)   . Dysthymic disorder   . Elevated brain natriuretic peptide (BNP) level   . Environmental allergies   . GAD (generalized anxiety disorder)   . GERD (gastroesophageal reflux disease)   . History of falling    multiple times  . History of Graves' disease    1987 s/p total thyroidectomy  . History of kidney stones   . History of scabies    07/ 2014  resolved  . History of syncope    per D/C note in epic 02-21-2013 ?vasovagal  . History of vertebral compression fracture    05/ 2014 --  L1, no surgical intervention  . HLD (hyperlipidemia)   . Hyperlipidemia   . Hypertension   . Hypogonadism in male   . IDA (iron deficiency anemia)   . Lumbago   . OA (osteoarthritis)   . OSA on CPAP    cpap machine-settings 17  . Osteoporosis    Severe  . Persistent atrial fibrillation [facility])    cardiologist--- dr r. Oval Linsey  . Plaque psoriasis    dermatologist--- dr Harriett Sine--- currently taking otezla  . Post-surgical hypothyroidism    followed by pcp---  s/p total thyroidectomy for graves disease in 1987  . Renal calculus, right   . Thrombocytopenia ([place])   . Unsteady gait    uses walker  . Wears hearing aid in both ears     SURGICAL HISTORY: Past Surgical History:  Procedure Laterality Date  . APPENDECTOMY  2011 approx.   [name]  CARDIAC CATHETERIZATION  12-31-2011   dr j. Beatrix Fetters @[facility]    normal LVF w/ multivessel CAD,  occluded RCA w/ colleterals  . CATARACT EXTRACTION W/ INTRAOCULAR LENS  IMPLANT, BILATERAL  2016 approx.  . COLONOSCOPY    . CYSTOSCOPY WITH RETROGRADE PYELOGRAM, URETEROSCOPY AND STENT PLACEMENT Left 11/07/2017   Procedure: CYSTOSCOPY WITH RETROGRADE PYELOGRAM, URETEROSCOPY AND STENT PLACEMENT;  Surgeon: Cleon Gustin, MD;  Location: [facility];  Service: Urology;  Laterality: Left;  . CYSTOSCOPY WITH RETROGRADE PYELOGRAM, URETEROSCOPY AND STENT PLACEMENT Right 10/22/2019   Procedure: CYSTOSCOPY WITH RETROGRADE PYELOGRAM, URETEROSCOPY AND STENT PLACEMENT;  Surgeon: Cleon Gustin, MD;  Location: [facility];  Service: Urology;  Laterality: Right;  . CYSTOSCOPY/URETEROSCOPY/HOLMIUM LASER/STENT PLACEMENT Right 03/11/2017   Procedure: CYSTOSCOPY/URETEROSCOPYSTENT PLACEMENT right ureter retrograde pylegram;  Surgeon: Cleon Gustin, MD;  [facility];  Service: Urology;  Laterality: Right;  . HARDWARE REMOVAL  10/05/2012   Procedure: HARDWARE REMOVAL;  Surgeon: [name], MD;  [facility];  Service: Orthopedics;  Laterality: Right;  REMOVING  STRYKER  GAMMA NAIL  . HARDWARE REMOVAL Right 07/03/2013   Procedure: HARDWARE REMOVAL RIGHT TIBIA ;  Surgeon: Rozanna Box, MD;  Location: [place];  Service: Orthopedics;  Laterality: Right;  . HIP CLOSED REDUCTION Right 10/15/2013   Procedure: CLOSED MANIPULATION HIP;  Surgeon: [name], MD;  [facility];  Service: Orthopedics;  Laterality: Right;  . HOLMIUM LASER APPLICATION Right 05/19/3809   Procedure: HOLMIUM LASER APPLICATION;  Surgeon: Cleon Gustin, MD;  [facility];  Service: Urology;  Laterality: Right;  . HOLMIUM LASER APPLICATION Left 1/75/1025   Procedure: HOLMIUM LASER APPLICATION;  Surgeon: Cleon Gustin, MD;  [facility];  Service: Urology;  Laterality: Left;  . HOLMIUM LASER APPLICATION  Left 05/15/2777   Procedure: HOLMIUM LASER APPLICATION;  Surgeon: Cleon Gustin, MD;  [facility];  Service: Urology;  Laterality: Left;  . HOLMIUM LASER APPLICATION Right 2/42/3536   Procedure: HOLMIUM LASER APPLICATION;  Surgeon: Cleon Gustin, MD;  Location: [facility];  Service: Urology;  Laterality: Right;  . INCISION AND DRAINAGE HIP Right 11/16/2013   Procedure: IRRIGATION AND DEBRIDEMENT RIGHT HIP;  Surgeon: [name], MD;  [facility];  Service: Orthopedics;  Laterality: Right;  . INTRAVASCULAR PRESSURE WIRE/FFR STUDY N/A 08/13/2019   Procedure: INTRAVASCULAR PRESSURE WIRE/FFR STUDY;  Surgeon: Nelva Bush, MD;  Location: [place] [facility];  Service: Cardiovascular;  Laterality: N/A;  . IR IMAGING GUIDED PORT INSERTION  06/22/2018  . IR NEPHROSTOMY PLACEMENT LEFT  03/28/2019  . IR URETERAL STENT LEFT NEW ACCESS W/O SEP NEPHROSTOMY CATH  10/24/2017  . IR URETERAL STENT LEFT NEW ACCESS W/O SEP NEPHROSTOMY CATH  03/26/2019  . NEPHROLITHOTOMY Right 02/08/2017   Procedure: NEPHROLITHOTOMY PERCUTANEOUS WITH SURGEON ACCESS;  Surgeon: Cleon Gustin, MD;  [facility];  Service: Urology;  Laterality: Right;  . NEPHROLITHOTOMY Left 10/24/2017   Procedure: NEPHROLITHOTOMY PERCUTANEOUS;  Surgeon: Cleon Gustin, MD;  [facility];  Service: Urology;  Laterality: Left;  . NEPHROLITHOTOMY Left 03/26/2019   Procedure: NEPHROLITHOTOMY PERCUTANEOUS;  Surgeon: Cleon Gustin, MD;  [facility];  Service: Urology;  Laterality: Left;  2 HRS  . NEPHROLITHOTOMY Left 04/17/2019   Procedure: NEPHROLITHOTOMY PERCUTANEOUS;  Surgeon: Cleon Gustin, MD;  [facility];  Service: Urology;  Laterality: Left;  2 HRS  . ORIF TIBIA FRACTURE Right 02/06/2013   Procedure: OPEN REDUCTION INTERNAL FIXATION (ORIF) TIBIA FRACTURE WITH IM ROD FIBULA;  Surgeon: Rozanna Box, MD;  Location: [place];  Service: Orthopedics;  Laterality: Right;  . ORIF TIBIA  FRACTURE Right 07/03/2013   Procedure: RIGHT TIBIA NON UNION REPAIR ;  Surgeon: Rozanna Box, MD;  Location: [place];  Service: Orthopedics;  Laterality: Right;  . ORIF WRIST FRACTURE  10/02/2012   Procedure: OPEN REDUCTION INTERNAL FIXATION (ORIF) WRIST FRACTURE;  Surgeon: Roseanne Kaufman, MD;  [facility];  Service: Orthopedics;  Laterality: Right;  WITH   ANTIBIOTIC  CEMENT  . ORIF WRIST FRACTURE Left 10/28/2013   Procedure: OPEN REDUCTION INTERNAL FIXATION (ORIF) WRIST FRACTURE with allograft;  Surgeon: Roseanne Kaufman, MD;  [facility];  Service: Orthopedics;  Laterality: Left;  DVR Plate  . QUADRICEPS TENDON REPAIR Left 07/15/2017   Procedure: REPAIR QUADRICEP TENDON;  Surgeon:  Frederik Pear, MD;  Location: [place];  Service: Orthopedics;  Laterality: Left;  . RIGHT/LEFT HEART CATH AND CORONARY ANGIOGRAPHY N/A 08/13/2019   Procedure: RIGHT/LEFT HEART CATH AND CORONARY ANGIOGRAPHY;  Surgeon: Nelva Bush, MD;  Location: [place] [facility];  Service: Cardiovascular;  Laterality: N/A;  . THYROIDECTOMY  02/1986  . TOTAL HIP ARTHROPLASTY Right 03-16-2016   @[facility]   . TOTAL HIP REVISION  10/05/2012   Procedure: TOTAL HIP REVISION;  Surgeon: [name], MD;  [facility];  Service: Orthopedics;  Laterality: Right;  RIGHT TOTAL HIP REVISION  . TOTAL HIP REVISION Right 09/17/2013   Procedure: REVISION RIGHT TOTAL HIP ARTHROPLASTY ;  Surgeon: [name], MD;  [facility];  Service: Orthopedics;  Laterality: Right;  . TOTAL HIP REVISION Right 10/26/2013   Procedure: REVISION RIGHT TOTAL HIP ARTHROPLASTY;  Surgeon: [name], MD;  [facility];  Service: Orthopedics;  Laterality: Right;  . TOTAL KNEE ARTHROPLASTY Bilateral right 03-15-2011;  left 06-30-2011  . TOTAL KNEE REVISION Left 04/11/2017   Procedure: TOTAL KNEE REVISION PATELLA and TIBIA;  Surgeon: Frederik Pear, MD;  Location: [place];  Service: Orthopedics;  Laterality: Left;    SOCIAL HISTORY: Social History    Socioeconomic History  . Marital status: Married    Spouse name: Not on file  . Number of children: Not on file  . Years of education: Not on file  . Highest education level: Not on file  Occupational History  . Not on file  Tobacco Use  . Smoking status: Former Smoker    Packs/day: 1.00    Years: 50.00    Pack years: 50.00    Types: Cigarettes    Quit date: 01/12/2012    Years since quitting: 8.4  . Smokeless tobacco: Never Used  Vaping Use  . Vaping Use: Never used  Substance and Sexual Activity  . Alcohol use: Not Currently    Comment: occasional-social  . Drug use: Never  . Sexual activity: Yes  Other Topics Concern  . Not on file  Social History Narrative   [place] 2.5 months, went home Feb 21st slipped and fell on back and developed.  Home PT/OT.  Just started outpatient physical therapy.  Friday night, misstepped.     Social Determinants of Health   Financial Resource Strain:   . Difficulty of Paying Living Expenses: Not on file  Food Insecurity:   . Worried About Charity fundraiser in the Last Year: Not on file  . Ran Out of Food in the Last Year: Not on file  Transportation Needs:   . Lack of Transportation (Medical): Not on file  . Lack of Transportation (Non-Medical): Not on file  Physical Activity:   . Days of Exercise per Week: Not on file  . Minutes of Exercise per Session: Not on file  Stress:   . Feeling of Stress : Not on file  Social Connections:   . Frequency of Communication with Friends and Family: Not on file  . Frequency of Social Gatherings with Friends and Family: Not on file  . Attends Religious Services: Not on file  . Active Member of Clubs or Organizations: Not on file  . Attends Archivist Meetings: Not on file  . Marital Status: Not on file  Intimate Partner Violence:   . Fear of Current or Ex-Partner: Not on file  . Emotionally Abused: Not on file  . Physically Abused: Not on file  . Sexually Abused: Not on  file     FAMILY HISTORY: Family History  Problem Relation Age of Onset  . CAD Father 61  . Asthma Father   . Alcohol abuse Father   . Arthritis Mother   . Alcohol abuse Sister     ALLERGIES:  is allergic to short ragweed pollen ext.  MEDICATIONS:  Current Outpatient Medications  Medication Sig Dispense Refill  . acetaminophen (TYLENOL) 500 MG tablet Take 1,000 mg by mouth daily as needed for moderate pain.    [name] alfuzosin (UROXATRAL) 10 MG 24 hr tablet Take 10 mg by mouth at bedtime.     [name] amoxicillin (AMOXIL) 500 MG capsule Take 2,000 mg by mouth See admin instructions. Take 4 capsules (2000 mg) by mouth 1 hour prior to dental work    . aspirin EC 81 MG tablet Take 81 mg by mouth daily.    [name] atorvastatin (LIPITOR) 80 MG tablet Take 1 tablet (80 mg total) by mouth daily. 90 tablet 2  . BELSOMRA 15 MG TABS TAKE 1 TABLET BY MOUTH  DAILY (Patient taking differently: Take 1 tablet by mouth at bedtime as needed. ) 90 tablet 1  . buPROPion (WELLBUTRIN XL) 300 MG 24 hr tablet Take 1 tablet (300 mg total) by mouth daily with breakfast. 90 tablet 3  . Calcium Citrate-Vitamin D (CALCIUM CITRATE + D PO) Take 1 tablet by mouth 2 (two) times daily.     . Cholecalciferol (VITAMIN D-3 PO) Take 2,000 Units by mouth 2 (two) times daily.     . clonazePAM (KLONOPIN) 1 MG tablet Take 1 tablet (1 mg total) by mouth every 6 (six) hours as needed. for anxiety 360 tablet 1  . CRANBERRY PO Take 2 each by mouth in the morning, at noon, and at bedtime.    [name] denosumab (PROLIA) 60 MG/ML SOSY injection Inject 60 mg as directed every 6 (six) months. Last injection: Feb 2020 Next injection: August 2020    . diltiazem (CARDIZEM CD) 240 MG 24 hr capsule Take 1 capsule (240 mg total) by mouth daily. 90 capsule 3  . DULoxetine (CYMBALTA) 60 MG capsule Take 1 capsule (60 mg total) by mouth 2 (two) times daily. 180 capsule 3  . ezetimibe (ZETIA) 10 MG tablet Take 1 tablet (10 mg total) by mouth daily. 90 tablet 2  . ferrous  sulfate 325 (65 FE) MG tablet Take 325 mg by mouth 2 (two) times daily with a meal.     . hydrochlorothiazide (HYDRODIURIL) 12.5 MG tablet Take 1 tablet (12.5 mg total) by mouth daily. 90 tablet 3  . HYDROcodone-acetaminophen (NORCO) 7.5-325 MG tablet Take 1 tablet by mouth every 6 (six) hours as needed for severe pain. 120 tablet 0  . ipratropium (ATROVENT) 0.03 % nasal spray as needed.    . isosorbide mononitrate (IMDUR) 30 MG 24 hr tablet Take 1 tablet (30 mg total) by mouth daily. (Patient taking differently: Take 30 mg by mouth daily with lunch. ) 90 tablet 3  . levothyroxine (SYNTHROID) 200 MCG tablet TAKE 1 TABLET BY MOUTH  DAILY AT 6 AM 90 tablet 3  . levothyroxine (SYNTHROID) 25 MCG tablet TAKE 1 TABLET BY MOUTH  DAILY BEFORE BREAKFAST 90 tablet 3  . loratadine (CLARITIN) 10 MG tablet Take 10 mg by mouth daily.     . Magnesium 500 MG CAPS Take 1 capsule (500 mg total) by mouth daily. (Patient taking differently: Take 500 mg by mouth every evening. ) 30 capsule 1  . metoprolol tartrate (LOPRESSOR) 25  MG tablet Take 1 tablet (25 mg total) by mouth 2 (two) times daily. 180 tablet 2  . mirabegron ER (MYRBETRIQ) 25 MG TB24 tablet Take 25 mg by mouth at bedtime.     . Multiple Vitamin (MULTIVITAMIN WITH MINERALS) TABS tablet Take 1 tablet by mouth daily. Men's One-A-Day 67+    . nitroGLYCERIN (NITROSTAT) 0.4 MG SL tablet Place 0.4 mg under the tongue every 5 (five) minutes as needed for chest pain. x3 doses as needed for chest pain    . omeprazole (PRILOSEC) 40 MG capsule TAKE 1 CAPSULE BY MOUTH  DAILY BEFORE BREAKFAST 90 capsule 3  . OTEZLA 30 MG TABS Take 30 mg by mouth 2 (two) times a day.     . Potassium Citrate 15 MEQ (1620 MG) TBCR Take 1 tablet by mouth daily.    . Probiotic Product (PROBIOTIC PO) Take 1 capsule by mouth daily with breakfast.    . rivaroxaban (XARELTO) 10 MG TABS tablet Take 1 tablet (10 mg total) by mouth daily. 90 tablet 1  . testosterone enanthate (DELATESTRYL) 200  MG/ML injection INJECT 1ML (200MG ) INTO THE MUSCLE EVERY 14 DAYS. DISCARD VIAL AFTER 28 DAYS. 5 mL 3  . tiZANidine (ZANAFLEX) 4 MG tablet TAKE 1 TABLET BY MOUTH;EVERY 6 HOURS AS NEEDED FOR MUSCLE SPASM(S) 360 tablet 1  . vitamin C (ASCORBIC ACID) 500 MG tablet Take 500 mg by mouth daily.    [name] losartan (COZAAR) 50 MG tablet Take 1 tablet (50 mg total) by mouth daily. 90 tablet 3   No current facility-administered medications for this visit.    REVIEW OF SYSTEMS:   A 10+ POINT REVIEW OF SYSTEMS WAS OBTAINED including neurology, dermatology, psychiatry, cardiac, respiratory, lymph, extremities, GI, GU, Musculoskeletal, constitutional, breasts, reproductive, HEENT.  All pertinent positives are noted in the HPI.  All others are negative.   PHYSICAL EXAMINATION: SKIN: chronic venous stasis changes in BLE, psoriasis rashes on elbows have improved with Otezla   ECOG FS:3 - Symptomatic, >50% confined to bed  Vitals:   06/05/20 1027  BP: 117/63  Pulse: 85  Resp: 18  Temp: (!) 95.7 F (35.4 C)  SpO2: 95%   Wt Readings from Last 3 Encounters:  06/05/20 285 lb 9.6 oz (129.5 kg)  03/26/20 288 lb (130.6 kg)  02/08/20 282 lb (127.9 kg)    Body mass index is 34.76 kg/m.    Exam was given in a chair   GENERAL:alert, in no acute distress and comfortable SKIN: no acute rashes, no significant lesions EYES: conjunctiva are pink and non-injected, sclera anicteric OROPHARYNX: MMM, no exudates, no oropharyngeal erythema or ulceration NECK: supple, no JVD LYMPH:  no palpable lymphadenopathy in the cervical, axillary or inguinal regions LUNGS: clear to auscultation b/l with normal respiratory effort HEART: regular rate & rhythm ABDOMEN:  normoactive bowel sounds , non tender, not distended. No palpable hepatosplenomegaly.  Extremity: no pedal edema PSYCH: alert & oriented x 3 with fluent speech NEURO: no focal motor/sensory deficits  LABORATORY DATA:  I have reviewed the data as  listed  . CBC Latest Ref Rng & Units 06/05/2020 02/04/2020 11/05/2019  WBC 4.0 - 10.5 K/uL 8.3 5.1 6.0  Hemoglobin 13.0 - 17.0 g/dL 15.5 15.9 13.9  Hematocrit 39 - 52 % 47.0 49.0 43.3  Platelets 150 - 400 K/uL 160 142(L) 198    . CMP Latest Ref Rng & Units 06/05/2020 02/04/2020 11/05/2019  Glucose 70 - 99 mg/dL 133(H) 126(H) 123(H)  BUN 8 - 23 mg/dL 35(H)  23 22  Creatinine 0.61 - 1.24 mg/dL 1.45(H) 1.10 1.14  Sodium 135 - 145 mmol/L 136 141 140  Potassium 3.5 - 5.1 mmol/L 4.4 4.3 4.1  Chloride 98 - 111 mmol/L 101 103 101  CO2 22 - 32 mmol/L 29 28 30   Calcium 8.9 - 10.3 mg/dL 10.2 9.2 9.3  Total Protein 6.5 - 8.1 g/dL 6.9 6.9 7.0  Total Bilirubin 0.3 - 1.2 mg/dL 0.9 1.2 0.6  Alkaline Phos 38 - 126 U/L 87 57 82  AST 15 - 41 U/L 38 30 22  ALT 0 - 44 U/L 53(H) 40 30   . Lab Results  Component Value Date   LDH 242 (H) 06/05/2020   06/28/2019 ECHOCARDIOGRAM COMPLETE (Accession [id number])   05/29/18 Left tonsil biopsy:   RADIOGRAPHIC STUDIES: I have personally reviewed the radiological images as listed and agreed with the findings in the report. No results found.  ASSESSMENT & PLAN:  74 y.o. male with  1. Diffuse Large B-Cell Lymphoma,- Stage III Left tonsil -05/29/18 Left tonsil biopsy which revealed Diffuse Large B-Cell Lymphoma, which requires treatment  -06/07/18 LDH at 214, will monitor change through treatment.  -Baseline ECHO 06/14/18: EF 50-55% -Standard regimen of R-CHOP with G-CSF support for 6 cycles starting 06/23/18  06/16/18 PET/CT revealed 1. Deauville 5 hypermetabolic lesions in the palatine tonsils, left internal jugular chain, and involving a right inguinal lymph node. Deauville 4 involvement of a left inguinal lymph node and an enlarged subcutaneous lesion favoring lymph node superficial to the lower right sternocleidomastoid muscle in the neck. 2. Other imaging findings of potential clinical significance: Aortic Atherosclerosis. Coronary atherosclerosis. Emphysema.  Bilateral nonobstructive nephrolithiasis. Umbilical hernia contains adipose tissues. Old right rib fractures some of which are nonunited. 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   08/22/18 PET/CT revealed Interval response to therapy. Overall Deauville criteria 3. Interval decrease in size and FDG uptake associated with palatine tonsil lesions. There has been resolution of previous left level-II and bilateral inguinal lymph nodes. 2. Enlarged subcutaneous lesion superficial to the lower right sternocleidomastoid muscle exhibits decreased FDG uptake. Currently Deauville criteria 2. 3. No new or progressive sites of disease identified. 4.  Aortic Atherosclerosis. 5. Multi vessel coronary artery atherosclerotic calcifications. 6. Unchanged vertebra plana deformity. 7. Kidney stones.   S/p 6 cycles of R-CHOP completed on 10/12/18  11/22/18 PET/CT revealed The tonsillar mass has essentially resolved. The large presumed lymph node superficial to the right sternocleidomastoid is stable in size at 2.3 cm in short axis, with maximum SUV of 3.3 (formerly 2.4), representing Deauville 3 activity, previously Deauville 2. 2. Other imaging findings of potential clinical significance: Aortic Atherosclerosis and Emphysema. Coronary atherosclerosis. Bilateral nonobstructive nephrolithiasis. Umbilical hernia contains adipose tissue. Chronic high activity posterior to the right hip implant along a region of bony irregularity and deformity probably reflecting chronic inflammation. L1 vertebra plana.  2.  Patient Active Problem List   Diagnosis Date Noted  . CHF (congestive heart failure) ([place]) 06/21/2019  . H/O nephrolithotomy with removal of calculi 03/26/2019  . Renal calculus 03/26/2019  . Generalized weakness 11/08/2018  . Well adult exam 10/02/2018  . Counseling regarding advance care planning and goals of care 07/09/2018  . Diffuse large B-cell lymphoma ([place]) 06/23/2018  . Port-A-Cath in place 06/23/2018  . Hypogonadism in male  11/22/2017  . Nephrolithiasis 10/24/2017  . Recurrent dislocation of left patella 07/15/2017  . Recurrent subluxation of patella, left 07/08/2017  . S/P revision of total knee 04/11/2017  . Failed total knee, left, initial  encounter ([place]) 04/09/2017  . Renal calculus, right 02/08/2017  . Protein-calorie malnutrition, severe ([place]) 12/08/2016  . Chronic obstructive pulmonary disease ([place]) 12/08/2016  . Staghorn renal calculus 11/05/2016  . Wound drainage right hip 11/15/2013  . Expected blood loss anemia 09/18/2013  . Hyponatremia 09/18/2013  . S/P right TH revision 09/17/2013  . Nonunion, fracture, Right tibia  07/03/2013  . UTI (urinary tract infection) 07/03/2013  . Pathological fracture due to osteoporosis with delayed healing 04/24/2013  . Osteoporosis with fracture 04/24/2013  . Rash and nonspecific skin eruption 03/08/2013  . Syncope 02/21/2013  . PAF (paroxysmal atrial fibrillation) ([place]) 02/21/2013  . Chronic anticoagulation 02/21/2013  . Fall 02/08/2013  . Physical deconditioning 02/08/2013  . Low back pain radiating to both legs 02/08/2013  . Compression fracture of L1 lumbar vertebra ([facility]) 02/08/2013  . Right tibial fracture 02/05/2013  . Obesity, unspecified 02/05/2013  . Osteomyelitis of right wrist ([place]) 11/02/2012  . Anemia 10/06/2012  . HTN (hypertension) 10/03/2012  . Psoriasis 10/03/2012  . Dyslipidemia 10/03/2012  . Vitamin D deficiency 10/03/2012  . GERD (gastroesophageal reflux disease) 10/03/2012  . Hyperglycemia 10/03/2012  . Anxiety 10/03/2012  . Depression 10/03/2012  . CAD (coronary artery disease) 10/03/2012  . DJD (degenerative joint disease) 10/03/2012  . Chronic gingivitis 10/03/2012  . Low testosterone, possible hypogonadism 10/01/2012  . Normocytic anemia 09/30/2012  . Right femoral fracture ([place]) 09/29/2012  . Right forearm fracture 09/29/2012  . Atrial fibrillation ([place]) 09/29/2012  . Hypothyroidism 09/29/2012  . History of recurrent  UTIs 09/29/2012   PLAN:  -Discussed pt labwork today, 06/05/20; blood counts are nml, blood chemistries reflect dehydration, liver enzymes are elevated, LDH is mildly elevated.  -Pt is now nearly 20 months out from treatment (10/12/2018). No clinical or lab evidence of lymphoma recurrence/progression.  -Will continue 3-4 month follow-ups. Plan to move to 6 month follow-ups if labs/exams remain stable for 2 years post-treatment. -Recommended that the pt continue to eat well, drink at least 48-64 oz of water each day, and walk 20-30 minutes each day.  -Discussed CDC guidelines regarding the COVID19 vaccine booster. Will give in clinic today.  -Recommend pt f/u with Rheumatology as scheduled.  -Will see back in 3 months with labs    FOLLOW UP: RTC with Dr Irene Limbo with labs in 3 months   The total time spent in the appt was 20 minutes and more than 50% was on counseling and direct patient cares.  All of the patient's questions were answered with apparent satisfaction. The patient knows to call the clinic with any problems, questions or concerns.   Sullivan Lone MD [place] AAHIVMS [facility] Hematology/Oncology Physician [facility]  (Office):       [phone] (Work cell):  [phone] (Fax):           [phone]  06/05/2020 11:24 AM  I, Yevette Edwards, am acting as a scribe for Dr. Sullivan Lone.   .I have reviewed the above documentation for accuracy and completeness, and I agree with the above. Brunetta Genera MD

## 2020-06-09 DIAGNOSIS — D225 Melanocytic nevi of trunk: Secondary | ICD-10-CM | POA: Diagnosis not present

## 2020-06-09 DIAGNOSIS — L821 Other seborrheic keratosis: Secondary | ICD-10-CM | POA: Diagnosis not present

## 2020-06-09 DIAGNOSIS — Z85828 Personal history of other malignant neoplasm of skin: Secondary | ICD-10-CM | POA: Diagnosis not present

## 2020-06-09 DIAGNOSIS — L400 Psoriasis vulgaris: Secondary | ICD-10-CM | POA: Diagnosis not present

## 2020-06-10 ENCOUNTER — Encounter: Payer: Self-pay | Admitting: Hematology

## 2020-06-10 DIAGNOSIS — R350 Frequency of micturition: Secondary | ICD-10-CM | POA: Diagnosis not present

## 2020-06-10 DIAGNOSIS — R3915 Urgency of urination: Secondary | ICD-10-CM | POA: Diagnosis not present

## 2020-06-25 DIAGNOSIS — B351 Tinea unguium: Secondary | ICD-10-CM | POA: Diagnosis not present

## 2020-06-25 DIAGNOSIS — I739 Peripheral vascular disease, unspecified: Secondary | ICD-10-CM | POA: Diagnosis not present

## 2020-06-26 ENCOUNTER — Ambulatory Visit: Payer: Medicare PPO | Admitting: Internal Medicine

## 2020-06-26 ENCOUNTER — Other Ambulatory Visit: Payer: Self-pay

## 2020-06-26 ENCOUNTER — Encounter: Payer: Self-pay | Admitting: Internal Medicine

## 2020-06-26 VITALS — BP 130/68 | HR 71 | Temp 98.4°F | Ht 76.0 in | Wt 291.0 lb

## 2020-06-26 DIAGNOSIS — F339 Major depressive disorder, recurrent, unspecified: Secondary | ICD-10-CM | POA: Diagnosis not present

## 2020-06-26 DIAGNOSIS — N320 Bladder-neck obstruction: Secondary | ICD-10-CM | POA: Diagnosis not present

## 2020-06-26 DIAGNOSIS — I480 Paroxysmal atrial fibrillation: Secondary | ICD-10-CM | POA: Diagnosis not present

## 2020-06-26 DIAGNOSIS — M545 Low back pain, unspecified: Secondary | ICD-10-CM

## 2020-06-26 DIAGNOSIS — E559 Vitamin D deficiency, unspecified: Secondary | ICD-10-CM

## 2020-06-26 DIAGNOSIS — Z23 Encounter for immunization: Secondary | ICD-10-CM | POA: Diagnosis not present

## 2020-06-26 DIAGNOSIS — E291 Testicular hypofunction: Secondary | ICD-10-CM

## 2020-06-26 DIAGNOSIS — E871 Hypo-osmolality and hyponatremia: Secondary | ICD-10-CM

## 2020-06-26 DIAGNOSIS — I10 Essential (primary) hypertension: Secondary | ICD-10-CM | POA: Diagnosis not present

## 2020-06-26 NOTE — Addendum Note (Signed)
Addended by: Hazle Quant on: 06/26/2020 11:11 AM   Modules accepted: Orders

## 2020-06-26 NOTE — Assessment & Plan Note (Signed)
Losartan 

## 2020-06-26 NOTE — Assessment & Plan Note (Signed)
Xarelto

## 2020-06-26 NOTE — Assessment & Plan Note (Signed)
Vit D 

## 2020-06-26 NOTE — Addendum Note (Signed)
Addended by: Hazle Quant on: 06/26/2020 11:07 AM   Modules accepted: Orders

## 2020-06-26 NOTE — Progress Notes (Signed)
Subjective:  Patient ID: [name], male    DOB: 04-Sep-1946  Age: 74 y.o. MRN: [id number]  CC: No chief complaint on file.   HPI [name] presents for LBP, OA, lymphoma, dyslipidemia f/u    Outpatient Medications Prior to Visit  Medication Sig Dispense Refill   acetaminophen (TYLENOL) 500 MG tablet Take 1,000 mg by mouth daily as needed for moderate pain.     alfuzosin (UROXATRAL) 10 MG 24 hr tablet Take 10 mg by mouth at bedtime.      amoxicillin (AMOXIL) 500 MG capsule Take 2,000 mg by mouth See admin instructions. Take 4 capsules (2000 mg) by mouth 1 hour prior to dental work     aspirin EC 81 MG tablet Take 81 mg by mouth daily.     atorvastatin (LIPITOR) 80 MG tablet Take 1 tablet (80 mg total) by mouth daily. 90 tablet 2   BELSOMRA 15 MG TABS TAKE 1 TABLET BY MOUTH  DAILY (Patient taking differently: Take 1 tablet by mouth at bedtime as needed. ) 90 tablet 1   buPROPion (WELLBUTRIN XL) 300 MG 24 hr tablet Take 1 tablet (300 mg total) by mouth daily with breakfast. 90 tablet 3   Calcium Citrate-Vitamin D (CALCIUM CITRATE + D PO) Take 1 tablet by mouth 2 (two) times daily.      Cholecalciferol (VITAMIN D-3 PO) Take 2,000 Units by mouth 2 (two) times daily.      clonazePAM (KLONOPIN) 1 MG tablet Take 1 tablet (1 mg total) by mouth every 6 (six) hours as needed. for anxiety 360 tablet 1   CRANBERRY PO Take 2 each by mouth in the morning, at noon, and at bedtime.     denosumab (PROLIA) 60 MG/ML SOSY injection Inject 60 mg as directed every 6 (six) months. Last injection: Feb 2020 Next injection: August 2020     diltiazem (CARDIZEM CD) 240 MG 24 hr capsule Take 1 capsule (240 mg total) by mouth daily. 90 capsule 3   DULoxetine (CYMBALTA) 60 MG capsule Take 1 capsule (60 mg total) by mouth 2 (two) times daily. 180 capsule 3   ezetimibe (ZETIA) 10 MG tablet Take 1 tablet (10 mg total) by mouth daily. 90 tablet 2   ferrous sulfate 325 (65 FE) MG tablet Take  325 mg by mouth 2 (two) times daily with a meal.      hydrochlorothiazide (HYDRODIURIL) 12.5 MG tablet Take 1 tablet (12.5 mg total) by mouth daily. 90 tablet 3   HYDROcodone-acetaminophen (NORCO) 7.5-325 MG tablet Take 1 tablet by mouth every 6 (six) hours as needed for severe pain. 120 tablet 0   ipratropium (ATROVENT) 0.03 % nasal spray as needed.     isosorbide mononitrate (IMDUR) 30 MG 24 hr tablet Take 1 tablet (30 mg total) by mouth daily. (Patient taking differently: Take 30 mg by mouth daily with lunch. ) 90 tablet 3   levothyroxine (SYNTHROID) 200 MCG tablet TAKE 1 TABLET BY MOUTH&nbsp;&nbsp;DAILY AT 6 AM 90 tablet 3   levothyroxine (SYNTHROID) 25 MCG tablet TAKE 1 TABLET BY MOUTH&nbsp;&nbsp;DAILY BEFORE BREAKFAST 90 tablet 3   loratadine (CLARITIN) 10 MG tablet Take 10 mg by mouth daily.      losartan (COZAAR) 50 MG tablet Take 1 tablet (50 mg total) by mouth daily. 90 tablet 3   Magnesium 500 MG CAPS Take 1 capsule (500 mg total) by mouth daily. (Patient taking differently: Take 500 mg by mouth every evening. ) 30 capsule 1   metoprolol  tartrate (LOPRESSOR) 25 MG tablet Take 1 tablet (25 mg total) by mouth 2 (two) times daily. 180 tablet 2   mirabegron ER (MYRBETRIQ) 25 MG TB24 tablet Take 25 mg by mouth at bedtime.      Multiple Vitamin (MULTIVITAMIN WITH MINERALS) TABS tablet Take 1 tablet by mouth daily. Men's One-A-Day 50+     nitroGLYCERIN (NITROSTAT) 0.4 MG SL tablet Place 0.4 mg under the tongue every 5 (five) minutes as needed for chest pain. x3 doses as needed for chest pain     omeprazole (PRILOSEC) 40 MG capsule TAKE 1 CAPSULE BY MOUTH&nbsp;&nbsp;DAILY BEFORE BREAKFAST 90 capsule 3   OTEZLA 30 MG TABS Take 30 mg by mouth 2 (two) times a day.      Potassium Citrate 15 MEQ (1620 MG) TBCR Take 1 tablet by mouth daily.     Probiotic Product (PROBIOTIC PO) Take 1 capsule by mouth daily with breakfast.     rivaroxaban (XARELTO) 10 MG TABS tablet Take 1 tablet (10  mg total) by mouth daily. 90 tablet 1   testosterone enanthate (DELATESTRYL) 200 MG/ML injection INJECT 1ML (200MG ) INTO THE MUSCLE EVERY 14 DAYS. DISCARD VIAL AFTER 28 DAYS. 5 mL 3   tiZANidine (ZANAFLEX) 4 MG tablet TAKE 1 TABLET BY MOUTH;EVERY 6 HOURS AS NEEDED FOR MUSCLE SPASM(S) 360 tablet 1   vitamin C (ASCORBIC ACID) 500 MG tablet Take 500 mg by mouth daily.     No facility-administered medications prior to visit.    ROS: Review of Systems  Constitutional: Negative for appetite change, fatigue and unexpected weight change.  HENT: Negative for congestion, nosebleeds, sneezing, sore throat and trouble swallowing.   Eyes: Negative for itching and visual disturbance.  Respiratory: Negative for cough.   Cardiovascular: Negative for chest pain, palpitations and leg swelling.  Gastrointestinal: Negative for abdominal distention, blood in stool, diarrhea and nausea.  Genitourinary: Negative for frequency and hematuria.  Musculoskeletal: Positive for arthralgias, back pain and gait problem. Negative for joint swelling and neck pain.  Skin: Negative for rash.  Neurological: Negative for dizziness, tremors, speech difficulty and weakness.  Psychiatric/Behavioral: Negative for agitation, dysphoric mood, sleep disturbance and suicidal ideas. The patient is not nervous/anxious.     Objective:  There were no vitals taken for this visit.  BP Readings from Last 3 Encounters:  06/05/20 117/63  03/26/20 136/60  02/08/20 128/64    Wt Readings from Last 3 Encounters:  06/05/20 285 lb 9.6 oz (129.5 kg)  03/26/20 288 lb (130.6 kg)  02/08/20 282 lb (127.9 kg)    Physical Exam Constitutional:      General: He is not in acute distress.    Appearance: He is well-developed.     Comments: NAD  Eyes:     Conjunctiva/sclera: Conjunctivae normal.     Pupils: Pupils are equal, round, and reactive to light.  Neck:     Thyroid: No thyromegaly.     Vascular: No JVD.  Cardiovascular:     Rate  and Rhythm: Normal rate and regular rhythm.     Heart sounds: Normal heart sounds. No murmur heard.  No friction rub. No gallop.   Pulmonary:     Effort: Pulmonary effort is normal. No respiratory distress.     Breath sounds: Normal breath sounds. No wheezing or rales.  Chest:     Chest wall: No tenderness.  Abdominal:     General: Bowel sounds are normal. There is no distension.     Palpations: Abdomen is soft. There is  no mass.     Tenderness: There is no abdominal tenderness. There is no guarding or rebound.  Musculoskeletal:        General: Tenderness present. Normal range of motion.     Cervical back: Normal range of motion.  Lymphadenopathy:     Cervical: No cervical adenopathy.  Skin:    General: Skin is warm and dry.     Findings: No rash.  Neurological:     Mental Status: He is alert and oriented to person, place, and time.     Cranial Nerves: No cranial nerve deficit.     Motor: No abnormal muscle tone.     Coordination: Coordination abnormal.     Gait: Gait abnormal.     Deep Tendon Reflexes: Reflexes are normal and symmetric.  Psychiatric:        Behavior: Behavior normal.        Thought Content: Thought content normal.        Judgment: Judgment normal.   walker LS stiff Knees w/pain on ROM   Lab Results  Component Value Date   WBC 8.3 06/05/2020   HGB 15.5 06/05/2020   HCT 47.0 06/05/2020   PLT 160 06/05/2020   GLUCOSE 133 (H) 06/05/2020   CHOL 115 10/17/2019   TRIG 47 10/17/2019   HDL 55 10/17/2019   LDLCALC 49 10/17/2019   ALT 53 (H) 06/05/2020   AST 38 06/05/2020   NA 136 06/05/2020   K 4.4 06/05/2020   CL 101 06/05/2020   CREATININE 1.45 (H) 06/05/2020   BUN 35 (H) 06/05/2020   CO2 29 06/05/2020   TSH 0.130 (L) 11/09/2018   PSA 1.01 10/02/2018   INR 1.0 03/26/2019   HGBA1C 5.8 (H) 02/06/2013    No results found.  Assessment & Plan:    Walker Kehr, MD

## 2020-06-26 NOTE — Assessment & Plan Note (Signed)
Cymbalta 

## 2020-06-26 NOTE — Patient Instructions (Signed)
Labs today

## 2020-06-26 NOTE — Addendum Note (Signed)
Addended by: Hazle Quant on: 06/26/2020 11:06 AM   Modules accepted: Orders

## 2020-06-26 NOTE — Assessment & Plan Note (Signed)
Norco prn  Potential benefits of a long term opioids use as well as potential risks (i.e. addiction risk, apnea etc) and complications (i.e. Somnolence, constipation and others) were explained to the patient and were aknowledged. 

## 2020-06-26 NOTE — Assessment & Plan Note (Signed)
Labs

## 2020-06-27 LAB — HEPATIC FUNCTION PANEL
AG Ratio: 1.5 (calc) (ref 1.0–2.5)
ALT: 35 U/L (ref 9–46)
AST: 26 U/L (ref 10–35)
Albumin: 3.8 g/dL (ref 3.6–5.1)
Alkaline phosphatase (APISO): 57 U/L (ref 35–144)
Bilirubin, Direct: 0.2 mg/dL (ref 0.0–0.2)
Globulin: 2.5 g/dL (calc) (ref 1.9–3.7)
Indirect Bilirubin: 0.7 mg/dL (calc) (ref 0.2–1.2)
Total Bilirubin: 0.9 mg/dL (ref 0.2–1.2)
Total Protein: 6.3 g/dL (ref 6.1–8.1)

## 2020-06-27 LAB — BASIC METABOLIC PANEL
BUN/Creatinine Ratio: 24 (calc) — ABNORMAL HIGH (ref 6–22)
BUN: 30 mg/dL — ABNORMAL HIGH (ref 7–25)
CO2: 29 mmol/L (ref 20–32)
Calcium: 9.1 mg/dL (ref 8.6–10.3)
Chloride: 102 mmol/L (ref 98–110)
Creat: 1.26 mg/dL — ABNORMAL HIGH (ref 0.70–1.18)
Glucose, Bld: 114 mg/dL — ABNORMAL HIGH (ref 65–99)
Potassium: 4.6 mmol/L (ref 3.5–5.3)
Sodium: 137 mmol/L (ref 135–146)

## 2020-06-27 LAB — PSA: PSA: 1.08 ng/mL (ref <=4.0)

## 2020-06-27 LAB — TESTOSTERONE: Testosterone: 623 ng/dL (ref 250–827)

## 2020-06-27 LAB — TSH: TSH: 0.02 mIU/L — ABNORMAL LOW (ref 0.40–4.50)

## 2020-07-02 ENCOUNTER — Other Ambulatory Visit: Payer: Self-pay | Admitting: Internal Medicine

## 2020-07-02 DIAGNOSIS — R7989 Other specified abnormal findings of blood chemistry: Secondary | ICD-10-CM

## 2020-07-08 ENCOUNTER — Other Ambulatory Visit (INDEPENDENT_AMBULATORY_CARE_PROVIDER_SITE_OTHER): Payer: Medicare PPO

## 2020-07-08 DIAGNOSIS — J301 Allergic rhinitis due to pollen: Secondary | ICD-10-CM | POA: Diagnosis not present

## 2020-07-08 DIAGNOSIS — R946 Abnormal results of thyroid function studies: Secondary | ICD-10-CM

## 2020-07-08 DIAGNOSIS — R7989 Other specified abnormal findings of blood chemistry: Secondary | ICD-10-CM

## 2020-07-08 DIAGNOSIS — J3089 Other allergic rhinitis: Secondary | ICD-10-CM | POA: Diagnosis not present

## 2020-07-08 LAB — TSH: TSH: 0.02 u[IU]/mL — ABNORMAL LOW (ref 0.35–4.50)

## 2020-07-08 LAB — T3, FREE: T3, Free: 3.1 pg/mL (ref 2.3–4.2)

## 2020-07-08 LAB — T4, FREE: Free T4: 1.62 ng/dL — ABNORMAL HIGH (ref 0.60–1.60)

## 2020-07-09 ENCOUNTER — Other Ambulatory Visit: Payer: Self-pay | Admitting: Internal Medicine

## 2020-07-09 DIAGNOSIS — F410 Panic disorder [episodic paroxysmal anxiety] without agoraphobia: Secondary | ICD-10-CM | POA: Diagnosis not present

## 2020-07-09 DIAGNOSIS — E039 Hypothyroidism, unspecified: Secondary | ICD-10-CM

## 2020-07-09 DIAGNOSIS — F3341 Major depressive disorder, recurrent, in partial remission: Secondary | ICD-10-CM | POA: Diagnosis not present

## 2020-07-10 ENCOUNTER — Other Ambulatory Visit: Payer: Self-pay | Admitting: Internal Medicine

## 2020-07-11 ENCOUNTER — Telehealth: Payer: Self-pay | Admitting: Internal Medicine

## 2020-07-11 NOTE — Telephone Encounter (Signed)
[name] on behalf of [facility] called and said that the other number for the pharmacy is [phone] for [facility].

## 2020-07-12 ENCOUNTER — Other Ambulatory Visit: Payer: Self-pay | Admitting: Cardiovascular Disease

## 2020-07-14 ENCOUNTER — Other Ambulatory Visit: Payer: Self-pay | Admitting: Internal Medicine

## 2020-07-14 MED ORDER — TESTOSTERONE ENANTHATE 200 MG/ML IM SOLN
INTRAMUSCULAR | 3 refills | Status: DC
Start: 2020-07-14 — End: 2020-12-31

## 2020-07-15 NOTE — Telephone Encounter (Signed)
Ok Addressed thx

## 2020-07-24 ENCOUNTER — Other Ambulatory Visit: Payer: Self-pay | Admitting: Adult Health

## 2020-07-24 ENCOUNTER — Other Ambulatory Visit: Payer: Self-pay | Admitting: Urology

## 2020-07-24 ENCOUNTER — Other Ambulatory Visit: Payer: Self-pay | Admitting: Cardiovascular Disease

## 2020-08-04 MED ORDER — METOPROLOL TARTRATE 25 MG PO TABS
25.0000 mg | ORAL_TABLET | Freq: Two times a day (BID) | ORAL | 3 refills | Status: DC
Start: 2020-08-04 — End: 2021-08-14

## 2020-08-04 MED ORDER — EZETIMIBE 10 MG PO TABS: 10.0000 mg | ORAL_TABLET | Freq: Every day | ORAL | 3 refills | Status: DC

## 2020-08-04 MED ORDER — ATORVASTATIN CALCIUM 80 MG PO TABS
80.0000 mg | ORAL_TABLET | Freq: Every day | ORAL | 3 refills | Status: DC
Start: 2020-08-04 — End: 2020-09-30

## 2020-08-04 MED ORDER — ISOSORBIDE MONONITRATE ER 30 MG PO TB24: 30.0000 mg | ORAL_TABLET | Freq: Every day | ORAL | 3 refills | Status: DC

## 2020-08-04 MED ORDER — HYDROCHLOROTHIAZIDE 12.5 MG PO TABS
12.5000 mg | ORAL_TABLET | Freq: Every day | ORAL | 3 refills | Status: DC
Start: 2020-08-04 — End: 2021-06-23

## 2020-08-06 DIAGNOSIS — J301 Allergic rhinitis due to pollen: Secondary | ICD-10-CM | POA: Diagnosis not present

## 2020-08-06 DIAGNOSIS — J3089 Other allergic rhinitis: Secondary | ICD-10-CM | POA: Diagnosis not present

## 2020-08-07 DIAGNOSIS — F410 Panic disorder [episodic paroxysmal anxiety] without agoraphobia: Secondary | ICD-10-CM | POA: Diagnosis not present

## 2020-08-07 DIAGNOSIS — F3341 Major depressive disorder, recurrent, in partial remission: Secondary | ICD-10-CM | POA: Diagnosis not present

## 2020-08-11 ENCOUNTER — Encounter: Payer: Self-pay | Admitting: Cardiovascular Disease

## 2020-08-11 ENCOUNTER — Other Ambulatory Visit: Payer: Self-pay

## 2020-08-11 ENCOUNTER — Ambulatory Visit: Payer: Medicare PPO | Admitting: Cardiovascular Disease

## 2020-08-11 VITALS — BP 124/70 | HR 84 | Ht 76.0 in | Wt 302.0 lb

## 2020-08-11 DIAGNOSIS — I480 Paroxysmal atrial fibrillation: Secondary | ICD-10-CM

## 2020-08-11 DIAGNOSIS — I2510 Atherosclerotic heart disease of native coronary artery without angina pectoris: Secondary | ICD-10-CM

## 2020-08-11 DIAGNOSIS — I5032 Chronic diastolic (congestive) heart failure: Secondary | ICD-10-CM

## 2020-08-11 NOTE — Progress Notes (Signed)
Cardiology Office Note  Date:  08/11/2020   ID:  Joseph, Lane 09-09-1946, MRN [id number]  PCP:  Cassandria Anger, MD  Cardiologist:   Skeet Latch, MD   No chief complaint on file.   History of Present Illness: [name] is a 74 y.o. male with medically managed moderate to severe CAD, long-standing-persistent atrial fibrillation, chronic diastolic heart failure, hypertension, hyperlipidemia, sleep apnea on CPAP, diffuse large B cell lymphoma (remission), hypothyroidism and prior tobacco abuse here for follow-up.  He was initially seen 05/2017 to establish care.  [name] was previously a patient of [name] and last saw him 01/2016.  At that time he was preparing for left hip surgery.  [name] recommended stress testing but he declined.  He underwent surgery with no complications.  He was initially diagnosed with atrial fibrillation around 2012.  He was started on diltiazem and amiodarone.  He was noted to be in persistent atrial fibrillation.  Therefore amiodarone was discontinued. At the time of his atrial fibrillation diagnosis he also underwent a heart catheterization. He reportedly had 90% obstruction of an unknown vessel that had developed collaterals. He was started on Ranexa.  He had an echo 11/03/16 revealed LVEF of 60-65%. Severe left and right atrial enlargement was noted and the IVC was dilated.  He had no angina and so Ranexa was discontinued.  Consideration was made for starting a beta-blocker instead of a calcium channel blocker.  However we decided not to make too many changes at one time.  [name] was diagnosed with diffuse large B cell lymphoma on 05/2018.  He completed 6 cycles of chemotherapy with rituximab, cyclophosphamide, doxorubicin, vincristine, and prednisone and is now in surveillance.  He had a baseline echo 06/2018 that revealed LVEF 50 to 55% and normal diastolic function.  He had apical hypokinesis and GLS was -13.9. Heart rate  was poorly controlled so metoprolol was added to his regimen.   He was seen in he ED and BNP was mildly elevated to 283.  He was started on lasix 20mg  daily and has noticed an improvement in his breathing.  He was referred for an echo 06/28/19 that revealed LVEF 50-55%.  GLS was -10.1%. The RV had mildly reduced systolic function.  He noted frequent urination and his blood pressure was poorly controlled so Lasix was switched to hydrochlorothiazide.  There was concern that his symptoms could be due to ischemia.  He underwent left heart catheterization 08/2019.  He was found to have significant two-vessel CAD including sequential 60 and 70% mid LAD lesions and chronic total occlusion of the mid RCA.  There were bridging left to right collaterals.  Right atrial pressure was 13.  Pulmonary capillary wedge pressure was 20 to 25 mmHg.  There was a consideration for two-vessel CABG versus medical management.  He was not deemed to be a good candidate for surgery.  He was started on Imdur.  Since then he has not had any chest pain and his breathing has been stable.  He struggled with 3 kidney stones.    [name] was struggling with frequent urination. Hydrochlorothiazide was reduced to 12.5 mg and losartan was increased.  He has been using his walker and has not had any more falls.  Lately he has been doing well.  He is happy now that he can get back to theatre shows.  He still has season tickets to [place] and has been to the new [employer] as well.  He has started back walking more.  He still gets tired quickly.  He has no exertional chest pain.  His edema has been controlled.  He denies orthopnea or PND.  He is back on a diet and trying to lose weight.  He celebrated 40 years of his relationship with [name] 04/2020.  Past Medical History:  Diagnosis Date  . Anticoagulant long-term use    xarelto--- takes for AFib,  managed by pcp, Dr Alain Marion  . Benign localized prostatic hyperplasia with lower urinary  tract symptoms (LUTS)   . CAD (coronary artery disease) cardiologist--  dr t. Oval Linsey   hx of known CAD obstruction w/ collaterals (cath done @ [facility] 12-31-2011) ;   last cardiac cath 08-13-2019  showed sig. 2V CAD involving proxLAD and CTO of the RCA/  chronic total occlusion midRCA w/ bridging and L>R collaterals  . Diastolic CHF ([place])    dx 07-17-1218 hospital admission (followed by pcp)  . Diffuse large B cell lymphoma [facility]) oncologist-- dr Irene Limbo--- in remission   dx 08/ 2019 -- bx left tonsill mass-- involving lymph nodes--- completed chemotherapy 10-12-2018  . DJD (degenerative joint disease)   . Dysthymic disorder   . Elevated brain natriuretic peptide (BNP) level   . Environmental allergies   . GAD (generalized anxiety disorder)   . GERD (gastroesophageal reflux disease)   . History of falling    multiple times  . History of Graves' disease    1987 s/p total thyroidectomy  . History of kidney stones   . History of scabies    07/ 2014  resolved  . History of syncope    per D/C note in epic 02-21-2013 ?vasovagal  . History of vertebral compression fracture    05/ 2014 --  L1, no surgical intervention  . HLD (hyperlipidemia)   . Hyperlipidemia   . Hypertension   . Hypogonadism in male   . IDA (iron deficiency anemia)   . Lumbago   . OA (osteoarthritis)   . OSA on CPAP    cpap machine-settings 17  . Osteoporosis    Severe  . Persistent atrial fibrillation [facility])    cardiologist--- dr r. Oval Linsey  . Plaque psoriasis    dermatologist--- dr Harriett Sine--- currently taking otezla  . Post-surgical hypothyroidism    followed by pcp---  s/p total thyroidectomy for graves disease in 1987  . Renal calculus, right   . Thrombocytopenia ([place])   . Unsteady gait    uses walker  . Wears hearing aid in both ears     Past Surgical History:  Procedure Laterality Date  . APPENDECTOMY  2011 approx.   [name] CARDIAC CATHETERIZATION  12-31-2011   dr Lenna Sciara. Beatrix Fetters @[facility]    normal LVF w/  multivessel CAD,  occluded RCA w/ colleterals  . CATARACT EXTRACTION W/ INTRAOCULAR LENS  IMPLANT, BILATERAL  2016 approx.  . COLONOSCOPY    . CYSTOSCOPY WITH RETROGRADE PYELOGRAM, URETEROSCOPY AND STENT PLACEMENT Left 11/07/2017   Procedure: CYSTOSCOPY WITH RETROGRADE PYELOGRAM, URETEROSCOPY AND STENT PLACEMENT;  Surgeon: Cleon Gustin, MD;  Location: [facility];  Service: Urology;  Laterality: Left;  . CYSTOSCOPY WITH RETROGRADE PYELOGRAM, URETEROSCOPY AND STENT PLACEMENT Right 10/22/2019   Procedure: CYSTOSCOPY WITH RETROGRADE PYELOGRAM, URETEROSCOPY AND STENT PLACEMENT;  Surgeon: Cleon Gustin, MD;  Location: [facility];  Service: Urology;  Laterality: Right;  . CYSTOSCOPY/URETEROSCOPY/HOLMIUM LASER/STENT PLACEMENT Right 03/11/2017   Procedure: CYSTOSCOPY/URETEROSCOPYSTENT PLACEMENT right ureter retrograde pylegram;  Surgeon: Cleon Gustin, MD;  [facility];  Service: Urology;  Laterality: Right;  . HARDWARE REMOVAL  10/05/2012   Procedure: HARDWARE REMOVAL;  Surgeon: [name], MD;  [facility];  Service: Orthopedics;  Laterality: Right;  REMOVING  STRYKER  GAMMA NAIL  . HARDWARE REMOVAL Right 07/03/2013   Procedure: HARDWARE REMOVAL RIGHT TIBIA ;  Surgeon: Rozanna Box, MD;  Location: [place];  Service: Orthopedics;  Laterality: Right;  . HIP CLOSED REDUCTION Right 10/15/2013   Procedure: CLOSED MANIPULATION HIP;  Surgeon: [name], MD;  [facility];  Service: Orthopedics;  Laterality: Right;  . HOLMIUM LASER APPLICATION Right 10/18/5629   Procedure: HOLMIUM LASER APPLICATION;  Surgeon: Cleon Gustin, MD;  [facility];  Service: Urology;  Laterality: Right;  . HOLMIUM LASER APPLICATION Left 4/97/0263   Procedure: HOLMIUM LASER APPLICATION;  Surgeon: Cleon Gustin, MD;  [facility];  Service: Urology;  Laterality: Left;  . HOLMIUM LASER APPLICATION Left 04/17/5884   Procedure: HOLMIUM LASER APPLICATION;  Surgeon: Cleon Gustin, MD;  [facility];  Service: Urology;  Laterality: Left;  . HOLMIUM LASER APPLICATION Right 0/27/7412   Procedure: HOLMIUM LASER APPLICATION;  Surgeon: Cleon Gustin, MD;  Location: [facility];  Service: Urology;  Laterality: Right;  . INCISION AND DRAINAGE HIP Right 11/16/2013   Procedure: IRRIGATION AND DEBRIDEMENT RIGHT HIP;  Surgeon: [name], MD;  [facility];  Service: Orthopedics;  Laterality: Right;  . INTRAVASCULAR PRESSURE WIRE/FFR STUDY N/A 08/13/2019   Procedure: INTRAVASCULAR PRESSURE WIRE/FFR STUDY;  Surgeon: Nelva Bush, MD;  Location: [place] [facility];  Service: Cardiovascular;  Laterality: N/A;  . IR IMAGING GUIDED PORT INSERTION  06/22/2018  . IR NEPHROSTOMY PLACEMENT LEFT  03/28/2019  . IR URETERAL STENT LEFT NEW ACCESS W/O SEP NEPHROSTOMY CATH  10/24/2017  . IR URETERAL STENT LEFT NEW ACCESS W/O SEP NEPHROSTOMY CATH  03/26/2019  . NEPHROLITHOTOMY Right 02/08/2017   Procedure: NEPHROLITHOTOMY PERCUTANEOUS WITH SURGEON ACCESS;  Surgeon: Cleon Gustin, MD;  [facility];  Service: Urology;  Laterality: Right;  . NEPHROLITHOTOMY Left 10/24/2017   Procedure: NEPHROLITHOTOMY PERCUTANEOUS;  Surgeon: Cleon Gustin, MD;  [facility];  Service: Urology;  Laterality: Left;  . NEPHROLITHOTOMY Left 03/26/2019   Procedure: NEPHROLITHOTOMY PERCUTANEOUS;  Surgeon: Cleon Gustin, MD;  [facility];  Service: Urology;  Laterality: Left;  2 HRS  . NEPHROLITHOTOMY Left 04/17/2019   Procedure: NEPHROLITHOTOMY PERCUTANEOUS;  Surgeon: Cleon Gustin, MD;  [facility];  Service: Urology;  Laterality: Left;  2 HRS  . ORIF TIBIA FRACTURE Right 02/06/2013   Procedure: OPEN REDUCTION INTERNAL FIXATION (ORIF) TIBIA FRACTURE WITH IM ROD FIBULA;  Surgeon: Rozanna Box, MD;  Location: [place];  Service: Orthopedics;  Laterality: Right;  . ORIF TIBIA FRACTURE Right 07/03/2013   Procedure: RIGHT TIBIA NON UNION REPAIR ;   Surgeon: Rozanna Box, MD;  Location: [place];  Service: Orthopedics;  Laterality: Right;  . ORIF WRIST FRACTURE  10/02/2012   Procedure: OPEN REDUCTION INTERNAL FIXATION (ORIF) WRIST FRACTURE;  Surgeon: Roseanne Kaufman, MD;  [facility];  Service: Orthopedics;  Laterality: Right;  WITH   ANTIBIOTIC  CEMENT  . ORIF WRIST FRACTURE Left 10/28/2013   Procedure: OPEN REDUCTION INTERNAL FIXATION (ORIF) WRIST FRACTURE with allograft;  Surgeon: Roseanne Kaufman, MD;  [facility];  Service: Orthopedics;  Laterality: Left;  DVR Plate  . QUADRICEPS TENDON REPAIR Left 07/15/2017   Procedure: REPAIR QUADRICEP TENDON;  Surgeon:  Frederik Pear, MD;  Location: [place];  Service: Orthopedics;  Laterality: Left;  . RIGHT/LEFT HEART CATH AND CORONARY ANGIOGRAPHY N/A 08/13/2019   Procedure: RIGHT/LEFT HEART CATH AND CORONARY ANGIOGRAPHY;  Surgeon: Nelva Bush, MD;  Location: [place] [facility];  Service: Cardiovascular;  Laterality: N/A;  . THYROIDECTOMY  02/1986  . TOTAL HIP ARTHROPLASTY Right 03-16-2016   @[facility]   . TOTAL HIP REVISION  10/05/2012   Procedure: TOTAL HIP REVISION;  Surgeon: [name], MD;  [facility];  Service: Orthopedics;  Laterality: Right;  RIGHT TOTAL HIP REVISION  . TOTAL HIP REVISION Right 09/17/2013   Procedure: REVISION RIGHT TOTAL HIP ARTHROPLASTY ;  Surgeon: [name], MD;  [facility];  Service: Orthopedics;  Laterality: Right;  . TOTAL HIP REVISION Right 10/26/2013   Procedure: REVISION RIGHT TOTAL HIP ARTHROPLASTY;  Surgeon: [name], MD;  [facility];  Service: Orthopedics;  Laterality: Right;  . TOTAL KNEE ARTHROPLASTY Bilateral right 03-15-2011;  left 06-30-2011  . TOTAL KNEE REVISION Left 04/11/2017   Procedure: TOTAL KNEE REVISION PATELLA and TIBIA;  Surgeon: Frederik Pear, MD;  Location: [place];  Service: Orthopedics;  Laterality: Left;     Current Outpatient Medications  Medication Sig Dispense Refill  . acetaminophen (TYLENOL) 500 MG tablet  Take 1,000 mg by mouth daily as needed for moderate pain.    [name] alfuzosin (UROXATRAL) 10 MG 24 hr tablet Take 10 mg by mouth at bedtime.     [name] amoxicillin (AMOXIL) 500 MG capsule Take 2,000 mg by mouth See admin instructions. Take 4 capsules (2000 mg) by mouth 1 hour prior to dental work    . aspirin EC 81 MG tablet Take 81 mg by mouth daily.    [name] atorvastatin (LIPITOR) 80 MG tablet Take 1 tablet (80 mg total) by mouth daily. 90 tablet 3  . BELSOMRA 15 MG TABS TAKE 1 TABLET BY MOUTH  DAILY (Patient taking differently: Take 1 tablet by mouth at bedtime as needed. ) 90 tablet 1  . buPROPion (WELLBUTRIN XL) 300 MG 24 hr tablet Take 1 tablet (300 mg total) by mouth daily with breakfast. 90 tablet 3  . Calcium Citrate-Vitamin D (CALCIUM CITRATE + D PO) Take 1 tablet by mouth 2 (two) times daily.     . Cholecalciferol (VITAMIN D-3 PO) Take 2,000 Units by mouth 2 (two) times daily.     . clonazePAM (KLONOPIN) 1 MG tablet Take 1 tablet (1 mg total) by mouth every 6 (six) hours as needed. for anxiety 360 tablet 1  . CRANBERRY PO Take 2 each by mouth in the morning, at noon, and at bedtime.    [name] denosumab (PROLIA) 60 MG/ML SOSY injection Inject 60 mg as directed every 6 (six) months. Last injection: Feb 2020 Next injection: August 2020    . diltiazem (CARDIZEM CD) 240 MG 24 hr capsule Take 1 capsule (240 mg total) by mouth daily. 90 capsule 3  . DULoxetine (CYMBALTA) 60 MG capsule Take 1 capsule (60 mg total) by mouth 2 (two) times daily. 180 capsule 3  . ezetimibe (ZETIA) 10 MG tablet Take 1 tablet (10 mg total) by mouth daily. 90 tablet 3  . ferrous sulfate 325 (65 FE) MG tablet Take 325 mg by mouth 2 (two) times daily with a meal.     . hydrochlorothiazide (HYDRODIURIL) 12.5 MG tablet Take 1 tablet (12.5 mg total) by mouth daily. 90 tablet 3  . HYDROcodone-acetaminophen (NORCO) 7.5-325 MG tablet Take 1 tablet  by mouth every 6 (six) hours as needed for severe pain. 120 tablet 0  . ipratropium (ATROVENT)  0.03 % nasal spray as needed.    . isosorbide mononitrate (IMDUR) 30 MG 24 hr tablet Take 1 tablet (30 mg total) by mouth daily. 90 tablet 3  . levothyroxine (SYNTHROID) 200 MCG tablet TAKE 1 TABLET BY MOUTH  DAILY AT 6 AM 90 tablet 3  . loratadine (CLARITIN) 10 MG tablet Take 10 mg by mouth daily.     [name] losartan (COZAAR) 50 MG tablet TAKE 1 TABLET EVERY DAY 90 tablet 2  . Magnesium 500 MG CAPS Take 1 capsule (500 mg total) by mouth daily. (Patient taking differently: Take 500 mg by mouth every evening. ) 30 capsule 1  . metoprolol tartrate (LOPRESSOR) 25 MG tablet Take 1 tablet (25 mg total) by mouth 2 (two) times daily. 180 tablet 3  . mirabegron ER (MYRBETRIQ) 25 MG TB24 tablet Take 25 mg by mouth at bedtime.     . Multiple Vitamin (MULTIVITAMIN WITH MINERALS) TABS tablet Take 1 tablet by mouth daily. Men's One-A-Day 58+    . nitroGLYCERIN (NITROSTAT) 0.4 MG SL tablet Place 0.4 mg under the tongue every 5 (five) minutes as needed for chest pain. x3 doses as needed for chest pain    . omeprazole (PRILOSEC) 40 MG capsule TAKE 1 CAPSULE BY MOUTH  DAILY BEFORE BREAKFAST 90 capsule 3  . Potassium Citrate 15 MEQ (1620 MG) TBCR Take 1 tablet by mouth daily.    . Probiotic Product (PROBIOTIC PO) Take 1 capsule by mouth daily with breakfast.    . Risankizumab-rzaa (SKYRIZI PEN [place]) Inject into the skin.    . rivaroxaban (XARELTO) 10 MG TABS tablet Take 1 tablet (10 mg total) by mouth daily. 90 tablet 1  . testosterone enanthate (DELATESTRYL) 200 MG/ML injection INJECT 1ML (200MG ) INTO THE MUSCLE EVERY 14 DAYS. DISCARD VIAL AFTER 28 DAYS. 5 mL 3  . tiZANidine (ZANAFLEX) 4 MG tablet TAKE 1 TABLET BY MOUTH;EVERY 6 HOURS AS NEEDED FOR MUSCLE SPASM(S) 360 tablet 1  . vitamin C (ASCORBIC ACID) 500 MG tablet Take 500 mg by mouth daily.     No current facility-administered medications for this visit.    Allergies:   Short ragweed pollen ext    Social History:  The patient  reports that he quit smoking  about 8 years ago. His smoking use included cigarettes. He has a 50.00 pack-year smoking history. He has never used smokeless tobacco. He reports previous alcohol use. He reports that he does not use drugs.   Family History:  The patient's family history includes Alcohol abuse in his father and sister; Arthritis in his mother; Asthma in his father; CAD (age of onset: 35) in his father.    ROS:  Please see the history of present illness.   Otherwise, review of systems are positive for none.   All other systems are reviewed and negative.    PHYSICAL EXAM: VS:  BP 124/70   Pulse 84   Ht 6\' 4"  (1.93 m)   Wt (!) 302 lb (137 kg)   SpO2 98%   BMI 36.76 kg/m  , BMI Body mass index is 36.76 kg/m. GENERAL:  Well appearing HEENT: Pupils equal round and reactive, fundi not visualized, oral mucosa unremarkable NECK:  No jugular venous distention, waveform within normal limits, carotid upstroke brisk and symmetric, no bruits LUNGS:  Clear to auscultation bilaterally HEART:  Irregularly irregular.  PMI not displaced or sustained,S1 and  S2 within normal limits, no S3, no S4, no clicks, no rubs, no murmurs ABD:  Flat, positive bowel sounds normal in frequency in pitch, no bruits, no rebound, no guarding, no midline pulsatile mass, no hepatomegaly, no splenomegaly EXT:  2 plus pulses throughout, trace edema, no cyanosis no clubbing SKIN:  No rashes no nodules NEURO:  Cranial nerves II through XII grossly intact, motor grossly intact throughout PSYCH:  Cognitively intact, oriented to person place and time   EKG:  EKG is ordered today. The ekg ordered today demonstrates Atrial fibrillation. Rate 71 bpm. Left axis deviation. Nonspecific IVCD. 12/26/17: Atrial fibrillation.  Rate 95 bpm.  Cannot rule out prior anterior infarct. 07/04/18: Atrial fibrillation.  Rate 118 bpm.  Right axis deviation. 08/06/19: Atrial fibrillation.  Rate 86 bpm.  Prior anteroseptal infarct.  Poor R wave progression.  02/08/2020:  Atrial fibrillation.  Rate 79 bpm.  LAD.  Prior anterior infarct. 11/1/121: Atrial fibrillation.  Rate 84 bpm. R axis deviation.    Echo 11/03/16: Study Conclusions  - Left ventricle: Systolic function was normal. The estimated   ejection fraction was in the range of 60% to 65%. The study is   not technically sufficient to allow evaluation of LV diastolic   function. - Left atrium: Severely dilated. - Right atrium: Severely dilated.  Impressions:  - Technically difficult study with poor echo windows. The LVEF is   grossly normal at 60-65%, there is severe biatrial enlargment,   the IVC is dilated.  LHC 08/13/19: Conclusions: 1. Significant two-vessel coronary artery disease, including sequential 60% proximal and 70% mid LAD stenosis, which are hemodynamically significant by DFR and also involve large D2 branch, as well as chronic total occlusion of the mid RCA with bridging and left-to-right collaterals. 2. Mildly elevated left heart and pulmonary artery pressures.  Prominent v-waves noted on PCWP; question if mitral regurgitation is more severe than appreciated on recent transthoracic echocardiogram. 3. Moderately elevated right heart filling pressures. 4. Normal Fick cardiac output/index.  Diagnostic Dominance: Right   RHC RA (mean): 13 mmHg RV (S/EDP): 49/14 mmHg PA (S/D, mean): 49/22 (31) mmHg PCWP (mean): 20-25 mmHg with prominent v-waves  Ao sat: 93% PA sat: 70%  Fick CO: 85 L/min Fick CI: 3.3 L/min/m^2  Recent Labs: 02/04/2020: BNP 138.1 06/05/2020: Hemoglobin 15.5; Platelets 160 06/26/2020: ALT 35; BUN 30; Creat 1.26; Potassium 4.6; Sodium 137 07/08/2020: TSH 0.02 Repeated and verified X2.    Lipid Panel    Component Value Date/Time   CHOL 115 10/17/2019 1127   TRIG 47 10/17/2019 1127   HDL 55 10/17/2019 1127   CHOLHDL 2.1 10/17/2019 1127   CHOLHDL 2 10/02/2018 1114   VLDL 16.8 10/02/2018 1114   LDLCALC 49 10/17/2019 1127      Wt Readings from Last  3 Encounters:  08/11/20 (!) 302 lb (137 kg)  06/26/20 291 lb (132 kg)  06/05/20 285 lb 9.6 oz (129.5 kg)      ASSESSMENT AND PLAN:  # Angina: # CAD: # Hyperlipidemia: Mr. Funari has obstructive coronary artery disease with evidence of collateralization. He is doing well with the addition of Imdur.  Continue with medical management.  He is on aspirin, atorvastatin, Zetia, metoprolol, and Imdur and doing well.  Lipids are at goal 10/2019.  # Hypertension:   # LE Edema: Blood pressure stable.  Continue diltiazem, hydrochlorothiazide, Imdur, and metoprolol.  # Longstanding persistent atrial fibrillation:  Asymptomatic.  He remains in atrial fibrillation with well-controlled rates.  Continue metoprolol, diltiazem, and  Xarelto.  # Morbid obesity:   Mr. Cacioppo is exercising more and starting on a diet.   # OSA: Continue CPAP   Current medicines are reviewed at length with the patient today.  The patient does not have concerns regarding medicines.  The following changes have been made: None  Labs/ tests ordered today include:   Orders Placed This Encounter  Procedures  . EKG 12-Lead     Disposition:   FU with Alysiah Suppa C. Oval Linsey, MD, [facility] in 6 months.       Signed, Georgene Kopper C. Oval Linsey, MD, [facility]  08/11/2020 11:03 AM    [place]

## 2020-08-11 NOTE — Patient Instructions (Signed)
Medication Instructions:  Your physician recommends that you continue on your current medications as directed. Please refer to the Current Medication list given to you today.  *If you need a refill on your cardiac medications before your next appointment, please call your pharmacy*  Lab Work: NONE   Testing/Procedures: NONE   Follow-Up: At [facility], you and your health needs are our priority.  As part of our continuing mission to provide you with exceptional heart care, we have created designated Provider Care Teams.  These Care Teams include your primary Cardiologist (physician) and Advanced Practice Providers (APPs -  Physician Assistants and Nurse Practitioners) who all work together to provide you with the care you need, when you need it.  We recommend signing up for the patient portal called "MyChart".  Sign up information is provided on this After Visit Summary.  MyChart is used to connect with patients for Virtual Visits (Telemedicine).  Patients are able to view lab/test results, encounter notes, upcoming appointments, etc.  Non-urgent messages can be sent to your provider as well.   To learn more about what you can do with MyChart, go to [email or web address].    Your next appointment:   6 month(s)  You will receive a reminder letter in the mail two months in advance. If you don't receive a letter, please call our office to schedule the follow-up appointment.  The format for your next appointment:   In Person  Provider:   You may see Tiffany [place], MD or one of the following Advanced Practice Providers on your designated Care Team:    Luke Kilroy, PA-C  Callie Goodrich, PA-C  Jesse Cleaver, FNP     

## 2020-08-29 ENCOUNTER — Other Ambulatory Visit: Payer: Self-pay | Admitting: Cardiovascular Disease

## 2020-09-01 DIAGNOSIS — I739 Peripheral vascular disease, unspecified: Secondary | ICD-10-CM | POA: Diagnosis not present

## 2020-09-01 DIAGNOSIS — B351 Tinea unguium: Secondary | ICD-10-CM | POA: Diagnosis not present

## 2020-09-01 DIAGNOSIS — I872 Venous insufficiency (chronic) (peripheral): Secondary | ICD-10-CM | POA: Diagnosis not present

## 2020-09-03 DIAGNOSIS — J3089 Other allergic rhinitis: Secondary | ICD-10-CM | POA: Diagnosis not present

## 2020-09-03 DIAGNOSIS — J301 Allergic rhinitis due to pollen: Secondary | ICD-10-CM | POA: Diagnosis not present

## 2020-09-08 DIAGNOSIS — M8589 Other specified disorders of bone density and structure, multiple sites: Secondary | ICD-10-CM | POA: Diagnosis not present

## 2020-09-08 NOTE — Progress Notes (Signed)
HEMATOLOGY/ONCOLOGY CLINIC NOTE  Date of Service: 09/09/2020  Patient Care Team: Plotnikov, Evie Lacks, MD as PCP - General (Internal Medicine) Skeet Latch, MD as PCP - Cardiology (Cardiology) Brunetta Genera, MD as Consulting Physician (Hematology) McKenzie, Candee Furbish, MD as Consulting Physician (Urology) Jackquline Denmark, MD as Consulting Physician (Gastroenterology) Skeet Latch, MD as Attending Physician (Cardiology)  CHIEF COMPLAINTS/PURPOSE OF CONSULTATION:  Follow Up for Diffuse Large B-Cell Lymphoma   HISTORY OF PRESENTING ILLNESS:   [name] is a wonderful 74 y.o. male who has been referred to [place] by Dr. Lew Dawes for evaluation and management of Diffuse Large B-Cell Lymphoma. He is accompanied today by his partner. The pt reports that he is doing well overall.   The pt reports that he presented to ENT Dr. Pollie Friar care after developing a cough and a new nodule in his mouth. He notes that the symptoms presented about a month ago and have not progressed or significantly worsened. He notes that he has frequent sinus drainage, and felt that this enlargement could initially be related to this. He denies any changes in his voice and difficulty breathing.   The pt notes that he is finishing three months of PT tomorrow after surgeries on his right leg and left knee. He notes that he takes blood thinners for Afib and uses his CPAP regularly now. The pt also receives testosterone injections, 200mg  every other week. The pt notes that he had several surgeries bilaterally to remove large kidney stones in the past. He notes that his thyroid replacement has been stable. His vertebral compression fracture was in 2013, after a fall in which he also broke his right femur and wrist. The pt also takes Prolia every 6 months for his osteoporosis. He has had 18 surgeries on his right leg.   He notes that his psoriasis has been stable and takes Enbrel twice a week, and has  taken this for 5 years. He was on 10 years of Humara before this. He was on Methotrexate prior to [facility].   The pt notes that he functions okay at home and is able to drive himself and cook for himself and feels that he can function independently with limitations on lifting heavier objects.   On review of systems, pt reports recent cough, left tonsil mass, stable energy levels, and denies fevers, chills, night sweats, unexpected weight loss, difficulty breathing, difficulty swallowing, noticing any other lumps or bumps, pain along the spine, recent infections, abdominal pains, difficulty urinating, leg swelling, and any other symptoms.  . On Social Hx the pt reports that he smoked cigarettes for many years until he quit 6 years ago, and drinks ETOH socially.    INTERVAL HISTORY  [name] is here for management and evaluation, after completing 6 planned cycles of R-CHOP, for followup and active surveillance of his Diffuse Large B-Cell Lymphoma. The patient's last visit with [place] was on 06/05/2020. The pt reports that he is doing well overall.  The pt reports that he has been able to get out of the house more and participate in his favorite activities again. He denies any falls in the interim. He has had no new symptoms or concerns. Pt continues to experience frequent urination.   Lab results today (09/09/20) of CBC w/diff and CMP is as follows: all values are WNL except for PLT at 131K, Glucose at 128, BUN at 25, Creatinine at 1.25. 09/09/2020 LDH at 222  On review of systems, pt reports polyuria  and denies fevers, chills, night sweats, unexplained weight loss, falls, back pain and any other symptoms.   MEDICAL HISTORY:  Past Medical History:  Diagnosis Date  . Anticoagulant long-term use    xarelto--- takes for AFib,  managed by pcp, Dr Alain Marion  . Benign localized prostatic hyperplasia with lower urinary tract symptoms (LUTS)   . CAD (coronary artery disease) cardiologist--  dr t.  Oval Linsey   hx of known CAD obstruction w/ collaterals (cath done @ [facility] 12-31-2011) ;   last cardiac cath 08-13-2019  showed sig. 2V CAD involving proxLAD and CTO of the RCA/  chronic total occlusion midRCA w/ bridging and L>R collaterals  . Diastolic CHF ([place])    dx [id number] hospital admission (followed by pcp)  . Diffuse large B cell lymphoma [facility]) oncologist-- dr Irene Limbo--- in remission   dx 08/ 2019 -- bx left tonsill mass-- involving lymph nodes--- completed chemotherapy 10-12-2018  . DJD (degenerative joint disease)   . Dysthymic disorder   . Elevated brain natriuretic peptide (BNP) level   . Environmental allergies   . GAD (generalized anxiety disorder)   . GERD (gastroesophageal reflux disease)   . History of falling    multiple times  . History of Graves' disease    1987 s/p total thyroidectomy  . History of kidney stones   . History of scabies    07/ 2014  resolved  . History of syncope    per D/C note in epic 02-21-2013 ?vasovagal  . History of vertebral compression fracture    05/ 2014 --  L1, no surgical intervention  . HLD (hyperlipidemia)   . Hyperlipidemia   . Hypertension   . Hypogonadism in male   . IDA (iron deficiency anemia)   . Lumbago   . OA (osteoarthritis)   . OSA on CPAP    cpap machine-settings 17  . Osteoporosis    Severe  . Persistent atrial fibrillation [facility])    cardiologist--- dr r. Oval Linsey  . Plaque psoriasis    dermatologist--- dr Harriett Sine--- currently taking otezla  . Post-surgical hypothyroidism    followed by pcp---  s/p total thyroidectomy for graves disease in 1987  . Renal calculus, right   . Thrombocytopenia ([place])   . Unsteady gait    uses walker  . Wears hearing aid in both ears     SURGICAL HISTORY: Past Surgical History:  Procedure Laterality Date  . APPENDECTOMY  2011 approx.   [name] CARDIAC CATHETERIZATION  12-31-2011   dr Lenna Sciara. Beatrix Fetters @[facility]    normal LVF w/ multivessel CAD,  occluded RCA w/ colleterals  . CATARACT  EXTRACTION W/ INTRAOCULAR LENS  IMPLANT, BILATERAL  2016 approx.  . COLONOSCOPY    . CYSTOSCOPY WITH RETROGRADE PYELOGRAM, URETEROSCOPY AND STENT PLACEMENT Left 11/07/2017   Procedure: CYSTOSCOPY WITH RETROGRADE PYELOGRAM, URETEROSCOPY AND STENT PLACEMENT;  Surgeon: Cleon Gustin, MD;  [facility];  Service: Urology;  Laterality: Left;  . CYSTOSCOPY WITH RETROGRADE PYELOGRAM, URETEROSCOPY AND STENT PLACEMENT Right 10/22/2019   Procedure: CYSTOSCOPY WITH RETROGRADE PYELOGRAM, URETEROSCOPY AND STENT PLACEMENT;  Surgeon: Cleon Gustin, MD;  Location: [facility];  Service: Urology;  Laterality: Right;  . CYSTOSCOPY/URETEROSCOPY/HOLMIUM LASER/STENT PLACEMENT Right 03/11/2017   Procedure: CYSTOSCOPY/URETEROSCOPYSTENT PLACEMENT right ureter retrograde pylegram;  Surgeon: Cleon Gustin, MD;  [facility];  Service: Urology;  Laterality: Right;  . HARDWARE REMOVAL  10/05/2012   Procedure: HARDWARE REMOVAL;  Surgeon: [name], MD;  [facility];  Service: Orthopedics;  Laterality:  Right;  REMOVING  STRYKER  GAMMA NAIL  . HARDWARE REMOVAL Right 07/03/2013   Procedure: HARDWARE REMOVAL RIGHT TIBIA ;  Surgeon: Rozanna Box, MD;  Location: [place];  Service: Orthopedics;  Laterality: Right;  . HIP CLOSED REDUCTION Right 10/15/2013   Procedure: CLOSED MANIPULATION HIP;  Surgeon: [name], MD;  [facility];  Service: Orthopedics;  Laterality: Right;  . HOLMIUM LASER APPLICATION Right 01/10/7407   Procedure: HOLMIUM LASER APPLICATION;  Surgeon: Cleon Gustin, MD;  [facility];  Service: Urology;  Laterality: Right;  . HOLMIUM LASER APPLICATION Left 1/44/8185   Procedure: HOLMIUM LASER APPLICATION;  Surgeon: Cleon Gustin, MD;  [facility];  Service: Urology;  Laterality: Left;  . HOLMIUM LASER APPLICATION Left 03/13/1496   Procedure: HOLMIUM LASER APPLICATION;  Surgeon: Cleon Gustin, MD;  [facility];  Service: Urology;   Laterality: Left;  . HOLMIUM LASER APPLICATION Right 0/26/3785   Procedure: HOLMIUM LASER APPLICATION;  Surgeon: Cleon Gustin, MD;  Location: [facility];  Service: Urology;  Laterality: Right;  . INCISION AND DRAINAGE HIP Right 11/16/2013   Procedure: IRRIGATION AND DEBRIDEMENT RIGHT HIP;  Surgeon: [name], MD;  [facility];  Service: Orthopedics;  Laterality: Right;  . INTRAVASCULAR PRESSURE WIRE/FFR STUDY N/A 08/13/2019   Procedure: INTRAVASCULAR PRESSURE WIRE/FFR STUDY;  Surgeon: Nelva Bush, MD;  Location: [place] [facility];  Service: Cardiovascular;  Laterality: N/A;  . IR IMAGING GUIDED PORT INSERTION  06/22/2018  . IR NEPHROSTOMY PLACEMENT LEFT  03/28/2019  . IR URETERAL STENT LEFT NEW ACCESS W/O SEP NEPHROSTOMY CATH  10/24/2017  . IR URETERAL STENT LEFT NEW ACCESS W/O SEP NEPHROSTOMY CATH  03/26/2019  . NEPHROLITHOTOMY Right 02/08/2017   Procedure: NEPHROLITHOTOMY PERCUTANEOUS WITH SURGEON ACCESS;  Surgeon: Cleon Gustin, MD;  [facility];  Service: Urology;  Laterality: Right;  . NEPHROLITHOTOMY Left 10/24/2017   Procedure: NEPHROLITHOTOMY PERCUTANEOUS;  Surgeon: Cleon Gustin, MD;  [facility];  Service: Urology;  Laterality: Left;  . NEPHROLITHOTOMY Left 03/26/2019   Procedure: NEPHROLITHOTOMY PERCUTANEOUS;  Surgeon: Cleon Gustin, MD;  [facility];  Service: Urology;  Laterality: Left;  2 HRS  . NEPHROLITHOTOMY Left 04/17/2019   Procedure: NEPHROLITHOTOMY PERCUTANEOUS;  Surgeon: Cleon Gustin, MD;  [facility];  Service: Urology;  Laterality: Left;  2 HRS  . ORIF TIBIA FRACTURE Right 02/06/2013   Procedure: OPEN REDUCTION INTERNAL FIXATION (ORIF) TIBIA FRACTURE WITH IM ROD FIBULA;  Surgeon: Rozanna Box, MD;  Location: [place];  Service: Orthopedics;  Laterality: Right;  . ORIF TIBIA FRACTURE Right 07/03/2013   Procedure: RIGHT TIBIA NON UNION REPAIR ;  Surgeon: Rozanna Box, MD;  Location: [place];  Service:  Orthopedics;  Laterality: Right;  . ORIF WRIST FRACTURE  10/02/2012   Procedure: OPEN REDUCTION INTERNAL FIXATION (ORIF) WRIST FRACTURE;  Surgeon: Roseanne Kaufman, MD;  [facility];  Service: Orthopedics;  Laterality: Right;  WITH   ANTIBIOTIC  CEMENT  . ORIF WRIST FRACTURE Left 10/28/2013   Procedure: OPEN REDUCTION INTERNAL FIXATION (ORIF) WRIST FRACTURE with allograft;  Surgeon: Roseanne Kaufman, MD;  [facility];  Service: Orthopedics;  Laterality: Left;  DVR Plate  . QUADRICEPS TENDON REPAIR Left 07/15/2017   Procedure: REPAIR QUADRICEP TENDON;  Surgeon: Frederik Pear, MD;  Location: [place];  Service: Orthopedics;  Laterality: Left;  . RIGHT/LEFT HEART CATH AND CORONARY ANGIOGRAPHY N/A 08/13/2019   Procedure: RIGHT/LEFT HEART CATH AND CORONARY  ANGIOGRAPHY;  Surgeon: Nelva Bush, MD;  Location: [place] [facility];  Service: Cardiovascular;  Laterality: N/A;  . THYROIDECTOMY  02/1986  . TOTAL HIP ARTHROPLASTY Right 03-16-2016   @[facility]   . TOTAL HIP REVISION  10/05/2012   Procedure: TOTAL HIP REVISION;  Surgeon: [name], MD;  [facility];  Service: Orthopedics;  Laterality: Right;  RIGHT TOTAL HIP REVISION  . TOTAL HIP REVISION Right 09/17/2013   Procedure: REVISION RIGHT TOTAL HIP ARTHROPLASTY ;  Surgeon: [name], MD;  [facility];  Service: Orthopedics;  Laterality: Right;  . TOTAL HIP REVISION Right 10/26/2013   Procedure: REVISION RIGHT TOTAL HIP ARTHROPLASTY;  Surgeon: [name], MD;  [facility];  Service: Orthopedics;  Laterality: Right;  . TOTAL KNEE ARTHROPLASTY Bilateral right 03-15-2011;  left 06-30-2011  . TOTAL KNEE REVISION Left 04/11/2017   Procedure: TOTAL KNEE REVISION PATELLA and TIBIA;  Surgeon: Frederik Pear, MD;  Location: [place];  Service: Orthopedics;  Laterality: Left;    SOCIAL HISTORY: Social History   Socioeconomic History  . Marital status: Married    Spouse name: Not on file  . Number of children: Not on file  . Years of  education: Not on file  . Highest education level: Not on file  Occupational History  . Not on file  Tobacco Use  . Smoking status: Former Smoker    Packs/day: 1.00    Years: 50.00    Pack years: 50.00    Types: Cigarettes    Quit date: 01/12/2012    Years since quitting: 8.6  . Smokeless tobacco: Never Used  Vaping Use  . Vaping Use: Never used  Substance and Sexual Activity  . Alcohol use: Not Currently    Comment: occasional-social  . Drug use: Never  . Sexual activity: Yes  Other Topics Concern  . Not on file  Social History Narrative   [place] 2.5 months, went home Feb 21st slipped and fell on back and developed.  Home PT/OT.  Just started outpatient physical therapy.  Friday night, misstepped.     Social Determinants of Health   Financial Resource Strain:   . Difficulty of Paying Living Expenses: Not on file  Food Insecurity:   . Worried About Charity fundraiser in the Last Year: Not on file  . Ran Out of Food in the Last Year: Not on file  Transportation Needs:   . Lack of Transportation (Medical): Not on file  . Lack of Transportation (Non-Medical): Not on file  Physical Activity:   . Days of Exercise per Week: Not on file  . Minutes of Exercise per Session: Not on file  Stress:   . Feeling of Stress : Not on file  Social Connections:   . Frequency of Communication with Friends and Family: Not on file  . Frequency of Social Gatherings with Friends and Family: Not on file  . Attends Religious Services: Not on file  . Active Member of Clubs or Organizations: Not on file  . Attends Archivist Meetings: Not on file  . Marital Status: Not on file  Intimate Partner Violence:   . Fear of Current or Ex-Partner: Not on file  . Emotionally Abused: Not on file  . Physically Abused: Not on file  . Sexually Abused: Not on file    FAMILY HISTORY: Family History  Problem Relation Age of Onset  . CAD Father 17  . Asthma Father   . Alcohol abuse Father   .  Arthritis Mother   . Alcohol abuse Sister     ALLERGIES:  is allergic to short ragweed pollen ext.  MEDICATIONS:  Current Outpatient Medications  Medication Sig Dispense Refill  . acetaminophen (TYLENOL) 500 MG tablet Take 1,000 mg by mouth daily as needed for moderate pain.    [name] alfuzosin (UROXATRAL) 10 MG 24 hr tablet Take 10 mg by mouth at bedtime.     [name] amoxicillin (AMOXIL) 500 MG capsule Take 2,000 mg by mouth See admin instructions. Take 4 capsules (2000 mg) by mouth 1 hour prior to dental work    . aspirin EC 81 MG tablet Take 81 mg by mouth daily.    [name] atorvastatin (LIPITOR) 80 MG tablet Take 1 tablet (80 mg total) by mouth daily. 90 tablet 3  . BELSOMRA 15 MG TABS TAKE 1 TABLET BY MOUTH  DAILY (Patient taking differently: Take 1 tablet by mouth at bedtime as needed. ) 90 tablet 1  . buPROPion (WELLBUTRIN XL) 300 MG 24 hr tablet Take 1 tablet (300 mg total) by mouth daily with breakfast. 90 tablet 3  . Calcium Citrate-Vitamin D (CALCIUM CITRATE + D PO) Take 1 tablet by mouth 2 (two) times daily.     . Cholecalciferol (VITAMIN D-3 PO) Take 2,000 Units by mouth 2 (two) times daily.     . clonazePAM (KLONOPIN) 1 MG tablet Take 1 tablet (1 mg total) by mouth every 6 (six) hours as needed. for anxiety 360 tablet 1  . CRANBERRY PO Take 2 each by mouth in the morning, at noon, and at bedtime.    [name] denosumab (PROLIA) 60 MG/ML SOSY injection Inject 60 mg as directed every 6 (six) months. Last injection: Feb 2020 Next injection: August 2020    . diltiazem (CARDIZEM CD) 240 MG 24 hr capsule Take 1 capsule (240 mg total) by mouth daily. 90 capsule 3  . DULoxetine (CYMBALTA) 60 MG capsule Take 1 capsule (60 mg total) by mouth 2 (two) times daily. 180 capsule 3  . ezetimibe (ZETIA) 10 MG tablet TAKE 1 TABLET EVERY DAY. 90 tablet 3  . ferrous sulfate 325 (65 FE) MG tablet Take 325 mg by mouth 2 (two) times daily with a meal.     . hydrochlorothiazide (HYDRODIURIL) 12.5 MG tablet Take 1 tablet (12.5  mg total) by mouth daily. 90 tablet 3  . HYDROcodone-acetaminophen (NORCO) 7.5-325 MG tablet Take 1 tablet by mouth every 6 (six) hours as needed for severe pain. 120 tablet 0  . ipratropium (ATROVENT) 0.03 % nasal spray as needed.    . isosorbide mononitrate (IMDUR) 30 MG 24 hr tablet Take 1 tablet (30 mg total) by mouth daily. 90 tablet 3  . levothyroxine (SYNTHROID) 200 MCG tablet TAKE 1 TABLET BY MOUTH  DAILY AT 6 AM 90 tablet 3  . loratadine (CLARITIN) 10 MG tablet Take 10 mg by mouth daily.     [name] losartan (COZAAR) 50 MG tablet TAKE 1 TABLET EVERY DAY 90 tablet 2  . Magnesium 500 MG CAPS Take 1 capsule (500 mg total) by mouth daily. (Patient taking differently: Take 500 mg by mouth every evening. ) 30 capsule 1  . metoprolol tartrate (LOPRESSOR) 25 MG tablet Take 1 tablet (25 mg total) by mouth 2 (two) times daily. 180 tablet 3  . mirabegron ER (MYRBETRIQ) 25 MG TB24 tablet Take 25 mg by mouth at bedtime.     . Multiple Vitamin (MULTIVITAMIN WITH MINERALS) TABS tablet Take 1 tablet by mouth daily. Men's One-A-Day  50+    . nitroGLYCERIN (NITROSTAT) 0.4 MG SL tablet Place 0.4 mg under the tongue every 5 (five) minutes as needed for chest pain. x3 doses as needed for chest pain    . omeprazole (PRILOSEC) 40 MG capsule TAKE 1 CAPSULE BY MOUTH  DAILY BEFORE BREAKFAST 90 capsule 3  . Potassium Citrate 15 MEQ (1620 MG) TBCR Take 1 tablet by mouth daily.    . Probiotic Product (PROBIOTIC PO) Take 1 capsule by mouth daily with breakfast.    . Risankizumab-rzaa (SKYRIZI PEN [place]) Inject into the skin.    . rivaroxaban (XARELTO) 10 MG TABS tablet Take 1 tablet (10 mg total) by mouth daily. 90 tablet 1  . testosterone enanthate (DELATESTRYL) 200 MG/ML injection INJECT 1ML (200MG ) INTO THE MUSCLE EVERY 14 DAYS. DISCARD VIAL AFTER 28 DAYS. 5 mL 3  . tiZANidine (ZANAFLEX) 4 MG tablet TAKE 1 TABLET BY MOUTH;EVERY 6 HOURS AS NEEDED FOR MUSCLE SPASM(S) 360 tablet 1  . vitamin C (ASCORBIC ACID) 500 MG tablet Take  500 mg by mouth daily.     No current facility-administered medications for this visit.    REVIEW OF SYSTEMS:   A 10+ POINT REVIEW OF SYSTEMS WAS OBTAINED including neurology, dermatology, psychiatry, cardiac, respiratory, lymph, extremities, GI, GU, Musculoskeletal, constitutional, breasts, reproductive, HEENT.  All pertinent positives are noted in the HPI.  All others are negative.   PHYSICAL EXAMINATION: SKIN: chronic venous stasis changes in BLE, psoriasis rashes on elbows have improved with Otezla   ECOG FS:3 - Symptomatic, >50% confined to bed  Vitals:   09/09/20 0928  BP: 140/75  Pulse: 84  Resp: 18  Temp: (!) 96.8 F (36 C)  SpO2: 96%   Wt Readings from Last 3 Encounters:  09/09/20 (!) 307 lb 9.6 oz (139.5 kg)  08/11/20 (!) 302 lb (137 kg)  06/26/20 291 lb (132 kg)    Body mass index is 37.44 kg/m.    Exam was given in a chair   GENERAL:alert, in no acute distress and comfortable SKIN: no acute rashes, no significant lesions EYES: conjunctiva are pink and non-injected, sclera anicteric OROPHARYNX: MMM, no exudates, no oropharyngeal erythema or ulceration NECK: supple, no JVD LYMPH:  no palpable lymphadenopathy in the cervical, axillary or inguinal regions LUNGS: clear to auscultation b/l with normal respiratory effort HEART: regular rate & rhythm ABDOMEN:  normoactive bowel sounds , non tender, not distended. No palpable hepatosplenomegaly.  Extremity: no pedal edema PSYCH: alert & oriented x 3 with fluent speech NEURO: no focal motor/sensory deficits  LABORATORY DATA:  I have reviewed the data as listed  . CBC Latest Ref Rng & Units 09/09/2020 06/05/2020 02/04/2020  WBC 4.0 - 10.5 K/uL 7.3 8.3 5.1  Hemoglobin 13.0 - 17.0 g/dL 16.7 15.5 15.9  Hematocrit 39 - 52 % 51.3 47.0 49.0  Platelets 150 - 400 K/uL 131(L) 160 142(L)    . CMP Latest Ref Rng & Units 09/09/2020 06/26/2020 06/05/2020  Glucose 70 - 99 mg/dL 128(H) 114(H) 133(H)  BUN 8 - 23 mg/dL 25(H)  30(H) 35(H)  Creatinine 0.61 - 1.24 mg/dL 1.25(H) 1.26(H) 1.45(H)  Sodium 135 - 145 mmol/L 136 137 136  Potassium 3.5 - 5.1 mmol/L 4.8 4.6 4.4  Chloride 98 - 111 mmol/L 100 102 101  CO2 22 - 32 mmol/L 28 29 29   Calcium 8.9 - 10.3 mg/dL 10.0 9.1 10.2  Total Protein 6.5 - 8.1 g/dL 6.9 6.3 6.9  Total Bilirubin 0.3 - 1.2 mg/dL 1.1 0.9 0.9  Alkaline Phos 38 - 126 U/L 54 - 87  AST 15 - 41 U/L 23 26 38  ALT 0 - 44 U/L 31 35 53(H)   . Lab Results  Component Value Date   LDH 222 (H) 09/09/2020   06/28/2019 ECHOCARDIOGRAM COMPLETE (Accession [id number])   05/29/18 Left tonsil biopsy:   RADIOGRAPHIC STUDIES: I have personally reviewed the radiological images as listed and agreed with the findings in the report. No results found.  ASSESSMENT & PLAN:  74 y.o. male with  1. Diffuse Large B-Cell Lymphoma,- Stage III Left tonsil -05/29/18 Left tonsil biopsy which revealed Diffuse Large B-Cell Lymphoma, which requires treatment  -06/07/18 LDH at 214, will monitor change through treatment.  -Baseline ECHO 06/14/18: EF 50-55% -Standard regimen of R-CHOP with G-CSF support for 6 cycles starting 06/23/18  06/16/18 PET/CT revealed 1. Deauville 5 hypermetabolic lesions in the palatine tonsils, left internal jugular chain, and involving a right inguinal lymph node. Deauville 4 involvement of a left inguinal lymph node and an enlarged subcutaneous lesion favoring lymph node superficial to the lower right sternocleidomastoid muscle in the neck. 2. Other imaging findings of potential clinical significance: Aortic Atherosclerosis. Coronary atherosclerosis. Emphysema. Bilateral nonobstructive nephrolithiasis. Umbilical hernia contains adipose tissues. Old right rib fractures some of which are nonunited. 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   08/22/18 PET/CT revealed Interval response to therapy. Overall Deauville criteria 3. Interval decrease in size and FDG uptake associated with palatine tonsil lesions. There has been  resolution of previous left level-II and bilateral inguinal lymph nodes. 2. Enlarged subcutaneous lesion superficial to the lower right sternocleidomastoid muscle exhibits decreased FDG uptake. Currently Deauville criteria 2. 3. No new or progressive sites of disease identified. 4.  Aortic Atherosclerosis. 5. Multi vessel coronary artery atherosclerotic calcifications. 6. Unchanged vertebra plana deformity. 7. Kidney stones.   S/p 6 cycles of R-CHOP completed on 10/12/18  11/22/18 PET/CT revealed The tonsillar mass has essentially resolved. The large presumed lymph node superficial to the right sternocleidomastoid is stable in size at 2.3 cm in short axis, with maximum SUV of 3.3 (formerly 2.4), representing Deauville 3 activity, previously Deauville 2. 2. Other imaging findings of potential clinical significance: Aortic Atherosclerosis and Emphysema. Coronary atherosclerosis. Bilateral nonobstructive nephrolithiasis. Umbilical hernia contains adipose tissue. Chronic high activity posterior to the right hip implant along a region of bony irregularity and deformity probably reflecting chronic inflammation. L1 vertebra plana.  2.  Patient Active Problem List   Diagnosis Date Noted  . Chronic diastolic heart failure ([place]) 06/21/2019  . H/O nephrolithotomy with removal of calculi 03/26/2019  . Renal calculus 03/26/2019  . Generalized weakness 11/08/2018  . Well adult exam 10/02/2018  . Counseling regarding advance care planning and goals of care 07/09/2018  . Diffuse large B-cell lymphoma ([place]) 06/23/2018  . Port-A-Cath in place 06/23/2018  . Hypogonadism in male 11/22/2017  . Nephrolithiasis 10/24/2017  . Recurrent dislocation of left patella 07/15/2017  . Recurrent subluxation of patella, left 07/08/2017  . S/P revision of total knee 04/11/2017  . Failed total knee, left, initial encounter ([place]) 04/09/2017  . Renal calculus, right 02/08/2017  . Protein-calorie malnutrition, severe ([place]) 12/08/2016   . Chronic obstructive pulmonary disease ([place]) 12/08/2016  . Staghorn renal calculus 11/05/2016  . Wound drainage right hip 11/15/2013  . Expected blood loss anemia 09/18/2013  . Hyponatremia 09/18/2013  . S/P right TH revision 09/17/2013  . Nonunion, fracture, Right tibia  07/03/2013  . UTI (urinary tract infection) 07/03/2013  . Pathological fracture due to osteoporosis  with delayed healing 04/24/2013  . Osteoporosis with fracture 04/24/2013  . Rash and nonspecific skin eruption 03/08/2013  . Syncope 02/21/2013  . PAF (paroxysmal atrial fibrillation) ([place]) 02/21/2013  . Chronic anticoagulation 02/21/2013  . Fall 02/08/2013  . Physical deconditioning 02/08/2013  . Low back pain radiating to both legs 02/08/2013  . Compression fracture of L1 lumbar vertebra ([facility]) 02/08/2013  . Right tibial fracture 02/05/2013  . Obesity, unspecified 02/05/2013  . Osteomyelitis of right wrist ([place]) 11/02/2012  . Anemia 10/06/2012  . HTN (hypertension) 10/03/2012  . Psoriasis 10/03/2012  . Dyslipidemia 10/03/2012  . Vitamin D deficiency 10/03/2012  . GERD (gastroesophageal reflux disease) 10/03/2012  . Hyperglycemia 10/03/2012  . Anxiety 10/03/2012  . Depression 10/03/2012  . CAD (coronary artery disease) 10/03/2012  . DJD (degenerative joint disease) 10/03/2012  . Chronic gingivitis 10/03/2012  . Low testosterone, possible hypogonadism 10/01/2012  . Normocytic anemia 09/30/2012  . Right femoral fracture ([place]) 09/29/2012  . Right forearm fracture 09/29/2012  . Atrial fibrillation ([place]) 09/29/2012  . Hypothyroidism 09/29/2012  . History of recurrent UTIs 09/29/2012   PLAN:  -Discussed pt labwork today, 09/09/20; PLT slightly low, other blood counts and chemistries are stable, LDH is elevated but trending down. -No lab or clinical evidence of DLBCL recurrence at this time. Will continue watchful observation. -Pt is now nearly 24 months out from treatment (10/12/2018). If labs and clinical  exam stable at next visit will move f/u to every 6 months. -Will see back in 3 months with labs    FOLLOW UP: RTC with Dr Irene Limbo with labs in 3 months   The total time spent in the appt was 20 minutes and more than 50% was on counseling and direct patient cares.  All of the patient's questions were answered with apparent satisfaction. The patient knows to call the clinic with any problems, questions or concerns.   Sullivan Lone MD [place] AAHIVMS [facility] Hematology/Oncology Physician [facility]  (Office):       [phone] (Work cell):  [phone] (Fax):           [phone]  09/09/2020 10:41 AM  I, Yevette Edwards, am acting as a scribe for Dr. Sullivan Lone.   .I have reviewed the above documentation for accuracy and completeness, and I agree with the above. Brunetta Genera MD

## 2020-09-09 ENCOUNTER — Inpatient Hospital Stay (HOSPITAL_BASED_OUTPATIENT_CLINIC_OR_DEPARTMENT_OTHER): Payer: Medicare PPO | Admitting: Hematology

## 2020-09-09 ENCOUNTER — Other Ambulatory Visit: Payer: Self-pay

## 2020-09-09 ENCOUNTER — Inpatient Hospital Stay: Payer: Medicare PPO | Attending: Hematology

## 2020-09-09 VITALS — BP 140/75 | HR 84 | Temp 96.8°F | Resp 18 | Ht 76.0 in | Wt 307.6 lb

## 2020-09-09 DIAGNOSIS — Z8572 Personal history of non-Hodgkin lymphomas: Secondary | ICD-10-CM | POA: Insufficient documentation

## 2020-09-09 DIAGNOSIS — C8330 Diffuse large B-cell lymphoma, unspecified site: Secondary | ICD-10-CM

## 2020-09-09 LAB — CMP (CANCER CENTER ONLY)
ALT: 31 U/L (ref 0–44)
AST: 23 U/L (ref 15–41)
Albumin: 3.5 g/dL (ref 3.5–5.0)
Alkaline Phosphatase: 54 U/L (ref 38–126)
Anion gap: 8 (ref 5–15)
BUN: 25 mg/dL — ABNORMAL HIGH (ref 8–23)
CO2: 28 mmol/L (ref 22–32)
Calcium: 10 mg/dL (ref 8.9–10.3)
Chloride: 100 mmol/L (ref 98–111)
Creatinine: 1.25 mg/dL — ABNORMAL HIGH (ref 0.61–1.24)
GFR, Estimated: >60 mL/min (ref >60)
Glucose, Bld: 128 mg/dL — ABNORMAL HIGH (ref 70–99)
Potassium: 4.8 mmol/L (ref 3.5–5.1)
Sodium: 136 mmol/L (ref 135–145)
Total Bilirubin: 1.1 mg/dL (ref 0.3–1.2)
Total Protein: 6.9 g/dL (ref 6.5–8.1)

## 2020-09-09 LAB — CBC WITH DIFFERENTIAL/PLATELET
Abs Immature Granulocytes: 0.03 10*3/uL (ref 0.00–0.07)
Basophils Absolute: 0 10*3/uL (ref 0.0–0.1)
Basophils Relative: 0 %
Eosinophils Absolute: 0.2 10*3/uL (ref 0.0–0.5)
Eosinophils Relative: 2 %
HCT: 51.3 % (ref 39.0–52.0)
Hemoglobin: 16.7 g/dL (ref 13.0–17.0)
Immature Granulocytes: 0 %
Lymphocytes Relative: 25 %
Lymphs Abs: 1.8 10*3/uL (ref 0.7–4.0)
MCH: 31.4 pg (ref 26.0–34.0)
MCHC: 32.6 g/dL (ref 30.0–36.0)
MCV: 96.4 fL (ref 80.0–100.0)
Monocytes Absolute: 0.6 10*3/uL (ref 0.1–1.0)
Monocytes Relative: 8 %
Neutro Abs: 4.7 10*3/uL (ref 1.7–7.7)
Neutrophils Relative %: 65 %
Platelets: 131 10*3/uL — ABNORMAL LOW (ref 150–400)
RBC: 5.32 MIL/uL (ref 4.22–5.81)
RDW: 14.3 % (ref 11.5–15.5)
WBC: 7.3 10*3/uL (ref 4.0–10.5)
nRBC: 0 % (ref 0.0–0.2)

## 2020-09-09 LAB — LACTATE DEHYDROGENASE: LDH: 222 U/L — ABNORMAL HIGH (ref 98–192)

## 2020-09-11 DIAGNOSIS — F331 Major depressive disorder, recurrent, moderate: Secondary | ICD-10-CM | POA: Diagnosis not present

## 2020-09-11 DIAGNOSIS — F410 Panic disorder [episodic paroxysmal anxiety] without agoraphobia: Secondary | ICD-10-CM | POA: Diagnosis not present

## 2020-09-18 DIAGNOSIS — H524 Presbyopia: Secondary | ICD-10-CM | POA: Diagnosis not present

## 2020-09-18 DIAGNOSIS — H35443 Age-related reticular degeneration of retina, bilateral: Secondary | ICD-10-CM | POA: Diagnosis not present

## 2020-09-18 DIAGNOSIS — Z961 Presence of intraocular lens: Secondary | ICD-10-CM | POA: Diagnosis not present

## 2020-09-18 DIAGNOSIS — H16223 Keratoconjunctivitis sicca, not specified as Sjogren's, bilateral: Secondary | ICD-10-CM | POA: Diagnosis not present

## 2020-09-29 ENCOUNTER — Other Ambulatory Visit: Payer: Self-pay

## 2020-09-30 ENCOUNTER — Encounter: Payer: Self-pay | Admitting: Hematology

## 2020-09-30 ENCOUNTER — Encounter: Payer: Self-pay | Admitting: Internal Medicine

## 2020-09-30 ENCOUNTER — Ambulatory Visit (INDEPENDENT_AMBULATORY_CARE_PROVIDER_SITE_OTHER): Payer: Medicare PPO

## 2020-09-30 ENCOUNTER — Ambulatory Visit: Payer: Medicare PPO | Admitting: Internal Medicine

## 2020-09-30 VITALS — BP 120/60 | HR 72 | Temp 97.9°F | Ht 76.0 in | Wt 312.4 lb

## 2020-09-30 DIAGNOSIS — I480 Paroxysmal atrial fibrillation: Secondary | ICD-10-CM

## 2020-09-30 DIAGNOSIS — Z0000 Encounter for general adult medical examination without abnormal findings: Secondary | ICD-10-CM | POA: Diagnosis not present

## 2020-09-30 DIAGNOSIS — I10 Essential (primary) hypertension: Secondary | ICD-10-CM | POA: Diagnosis not present

## 2020-09-30 DIAGNOSIS — E559 Vitamin D deficiency, unspecified: Secondary | ICD-10-CM

## 2020-09-30 DIAGNOSIS — C8330 Diffuse large B-cell lymphoma, unspecified site: Secondary | ICD-10-CM | POA: Diagnosis not present

## 2020-09-30 DIAGNOSIS — Z7901 Long term (current) use of anticoagulants: Secondary | ICD-10-CM | POA: Diagnosis not present

## 2020-09-30 MED ORDER — HYDROCODONE-ACETAMINOPHEN 7.5-325 MG PO TABS: 1.0000 | ORAL_TABLET | Freq: Four times a day (QID) | ORAL | 0 refills | Status: DC | PRN

## 2020-09-30 NOTE — Assessment & Plan Note (Signed)
Losartan 

## 2020-09-30 NOTE — Patient Instructions (Addendum)
[name] , Thank you for taking time to come for your Medicare Wellness Visit. I appreciate your ongoing commitment to your health goals. Please review the following plan we discussed and let me know if I can assist you in the future.   Screening recommendations/referrals: Colonoscopy: never done Recommended yearly ophthalmology/optometry visit for glaucoma screening and checkup Recommended yearly dental visit for hygiene and checkup  Vaccinations: Influenza vaccine: 06/26/2020 Pneumococcal vaccine: up to date  Tdap vaccine: 10/11/2012; due very 10 years (2024) Shingles vaccine: never done Covid-19: up to date  Advanced directives: Please bring a copy of your health care power of attorney and living will to the office at your convenience.  Conditions/risks identified: Yes; Reviewed health maintenance screenings with patient today and relevant education, vaccines, and/or referrals were provided. Please continue to do your personal lifestyle choices by: daily care of teeth and gums, regular physical activity (goal should be 5 days a week for 30 minutes), eat a healthy diet, avoid tobacco and drug use, limiting any alcohol intake, taking a low-dose aspirin (if not allergic or have been advised by your provider otherwise) and taking vitamins and minerals as recommended by your provider. Continue doing brain stimulating activities (puzzles, reading, adult coloring books, staying active) to keep memory sharp. Continue to eat heart healthy diet (full of fruits, vegetables, whole grains, lean protein, water--limit salt, fat, and sugar intake) and increase physical activity as tolerated.  Next appointment: Please schedule your next Medicare Wellness Visit with your Nurse Health Advisor in 1 year by calling [phone].  Preventive Care 75 Years and Older, Male Preventive care refers to lifestyle choices and visits with your health care provider that can promote health and wellness. What does  preventive care include?  A yearly physical exam. This is also called an annual well check.  Dental exams once or twice a year.  Routine eye exams. Ask your health care provider how often you should have your eyes checked.  Personal lifestyle choices, including:  Daily care of your teeth and gums.  Regular physical activity.  Eating a healthy diet.  Avoiding tobacco and drug use.  Limiting alcohol use.  Practicing safe sex.  Taking low doses of aspirin every day.  Taking vitamin and mineral supplements as recommended by your health care provider. What happens during an annual well check? The services and screenings done by your health care provider during your annual well check will depend on your age, overall health, lifestyle risk factors, and family history of disease. Counseling  Your health care provider may ask you questions about your:  Alcohol use.  Tobacco use.  Drug use.  Emotional well-being.  Home and relationship well-being.  Sexual activity.  Eating habits.  History of falls.  Memory and ability to understand (cognition).  Work and work Statistician. Screening  You may have the following tests or measurements:  Height, weight, and BMI.  Blood pressure.  Lipid and cholesterol levels. These may be checked every 5 years, or more frequently if you are over 37 years old.  Skin check.  Lung cancer screening. You may have this screening every year starting at age 60 if you have a 30-pack-year history of smoking and currently smoke or have quit within the past 15 years.  Fecal occult blood test (FOBT) of the stool. You may have this test every year starting at age 13.  Flexible sigmoidoscopy or colonoscopy. You may have a sigmoidoscopy every 5 years or a colonoscopy every 10 years starting at age  50.  Prostate cancer screening. Recommendations will vary depending on your family history and other risks.  Hepatitis C blood test.  Hepatitis B  blood test.  Sexually transmitted disease (STD) testing.  Diabetes screening. This is done by checking your blood sugar (glucose) after you have not eaten for a while (fasting). You may have this done every 1-3 years.  Abdominal aortic aneurysm (AAA) screening. You may need this if you are a current or former smoker.  Osteoporosis. You may be screened starting at age 29 if you are at high risk. Talk with your health care provider about your test results, treatment options, and if necessary, the need for more tests. Vaccines  Your health care provider may recommend certain vaccines, such as:  Influenza vaccine. This is recommended every year.  Tetanus, diphtheria, and acellular pertussis (Tdap, Td) vaccine. You may need a Td booster every 10 years.  Zoster vaccine. You may need this after age 20.  Pneumococcal 13-valent conjugate (PCV13) vaccine. One dose is recommended after age 2.  Pneumococcal polysaccharide (PPSV23) vaccine. One dose is recommended after age 23. Talk to your health care provider about which screenings and vaccines you need and how often you need them. This information is not intended to replace advice given to you by your health care provider. Make sure you discuss any questions you have with your health care provider. Document Released: 10/24/2015 Document Revised: 06/16/2016 Document Reviewed: 07/29/2015 Elsevier Interactive Patient Education  2017 [place] Prevention in the Home Falls can cause injuries. They can happen to people of all ages. There are many things you can do to make your home safe and to help prevent falls. What can I do on the outside of my home?  Regularly fix the edges of walkways and driveways and fix any cracks.  Remove anything that might make you trip as you walk through a door, such as a raised step or threshold.  Trim any bushes or trees on the path to your home.  Use bright outdoor lighting.  Clear any walking paths  of anything that might make someone trip, such as rocks or tools.  Regularly check to see if handrails are loose or broken. Make sure that both sides of any steps have handrails.  Any raised decks and porches should have guardrails on the edges.  Have any leaves, snow, or ice cleared regularly.  Use sand or salt on walking paths during winter.  Clean up any spills in your garage right away. This includes oil or grease spills. What can I do in the bathroom?  Use night lights.  Install grab bars by the toilet and in the tub and shower. Do not use towel bars as grab bars.  Use non-skid mats or decals in the tub or shower.  If you need to sit down in the shower, use a plastic, non-slip stool.  Keep the floor dry. Clean up any water that spills on the floor as soon as it happens.  Remove soap buildup in the tub or shower regularly.  Attach bath mats securely with double-sided non-slip rug tape.  Do not have throw rugs and other things on the floor that can make you trip. What can I do in the bedroom?  Use night lights.  Make sure that you have a light by your bed that is easy to reach.  Do not use any sheets or blankets that are too big for your bed. They should not hang down onto the floor.  Have a firm chair that has side arms. You can use this for support while you get dressed.  Do not have throw rugs and other things on the floor that can make you trip. What can I do in the kitchen?  Clean up any spills right away.  Avoid walking on wet floors.  Keep items that you use a lot in easy-to-reach places.  If you need to reach something above you, use a strong step stool that has a grab bar.  Keep electrical cords out of the way.  Do not use floor polish or wax that makes floors slippery. If you must use wax, use non-skid floor wax.  Do not have throw rugs and other things on the floor that can make you trip. What can I do with my stairs?  Do not leave any items on  the stairs.  Make sure that there are handrails on both sides of the stairs and use them. Fix handrails that are broken or loose. Make sure that handrails are as long as the stairways.  Check any carpeting to make sure that it is firmly attached to the stairs. Fix any carpet that is loose or worn.  Avoid having throw rugs at the top or bottom of the stairs. If you do have throw rugs, attach them to the floor with carpet tape.  Make sure that you have a light switch at the top of the stairs and the bottom of the stairs. If you do not have them, ask someone to add them for you. What else can I do to help prevent falls?  Wear shoes that:  Do not have high heels.  Have rubber bottoms.  Are comfortable and fit you well.  Are closed at the toe. Do not wear sandals.  If you use a stepladder:  Make sure that it is fully opened. Do not climb a closed stepladder.  Make sure that both sides of the stepladder are locked into place.  Ask someone to hold it for you, if possible.  Clearly mark and make sure that you can see:  Any grab bars or handrails.  First and last steps.  Where the edge of each step is.  Use tools that help you move around (mobility aids) if they are needed. These include:  Canes.  Walkers.  Scooters.  Crutches.  Turn on the lights when you go into a dark area. Replace any light bulbs as soon as they burn out.  Set up your furniture so you have a clear path. Avoid moving your furniture around.  If any of your floors are uneven, fix them.  If there are any pets around you, be aware of where they are.  Review your medicines with your doctor. Some medicines can make you feel dizzy. This can increase your chance of falling. Ask your doctor what other things that you can do to help prevent falls. This information is not intended to replace advice given to you by your health care provider. Make sure you discuss any questions you have with your health care  provider. Document Released: 07/24/2009 Document Revised: 03/04/2016 Document Reviewed: 11/01/2014 Elsevier Interactive Patient Education  2017 [employer].

## 2020-09-30 NOTE — Assessment & Plan Note (Signed)
On Vit D 

## 2020-09-30 NOTE — Assessment & Plan Note (Signed)
Xarelto

## 2020-09-30 NOTE — Progress Notes (Signed)
Subjective:  Patient ID: [name], male    DOB: [birth year]  Age: 74 y.o. MRN: [id number]  CC: Follow-up   HPI TIMOTEO CARREIRO presents for LBP, gait disorder, lymphoma follow-up  Outpatient Medications Prior to Visit  Medication Sig Dispense Refill  . acetaminophen (TYLENOL) 500 MG tablet Take 1,000 mg by mouth daily as needed for moderate pain.    [name] alfuzosin (UROXATRAL) 10 MG 24 hr tablet Take 10 mg by mouth at bedtime.     [name] amoxicillin (AMOXIL) 500 MG capsule Take 2,000 mg by mouth See admin instructions. Take 4 capsules (2000 mg) by mouth 1 hour prior to dental work    . aspirin EC 81 MG tablet Take 81 mg by mouth daily.    . BELSOMRA 15 MG TABS TAKE 1 TABLET BY MOUTH  DAILY (Patient taking differently: Take 1 tablet by mouth at bedtime as needed.) 90 tablet 1  . buPROPion (WELLBUTRIN XL) 300 MG 24 hr tablet Take 1 tablet (300 mg total) by mouth daily with breakfast. 90 tablet 3  . Calcium Citrate-Vitamin D (CALCIUM CITRATE + D PO) Take 1 tablet by mouth 2 (two) times daily.     . Cholecalciferol (VITAMIN D-3 PO) Take 2,000 Units by mouth 2 (two) times daily.     . clonazePAM (KLONOPIN) 1 MG tablet Take 1 tablet (1 mg total) by mouth every 6 (six) hours as needed. for anxiety 360 tablet 1  . CRANBERRY PO Take 2 each by mouth in the morning, at noon, and at bedtime.    [name] denosumab (PROLIA) 60 MG/ML SOSY injection Inject 60 mg as directed every 6 (six) months. Last injection: Feb 2020 Next injection: August 2020    . diltiazem (CARDIZEM CD) 240 MG 24 hr capsule Take 1 capsule (240 mg total) by mouth daily. 90 capsule 3  . DULoxetine (CYMBALTA) 60 MG capsule Take 1 capsule (60 mg total) by mouth 2 (two) times daily. 180 capsule 3  . ezetimibe (ZETIA) 10 MG tablet TAKE 1 TABLET EVERY DAY. 90 tablet 3  . ferrous sulfate 325 (65 FE) MG tablet Take 325 mg by mouth 2 (two) times daily with a meal.     . hydrochlorothiazide (HYDRODIURIL) 12.5 MG tablet Take 1 tablet (12.5 mg total)  by mouth daily. 90 tablet 3  . HYDROcodone-acetaminophen (NORCO) 7.5-325 MG tablet Take 1 tablet by mouth every 6 (six) hours as needed for severe pain. 120 tablet 0  . ipratropium (ATROVENT) 0.03 % nasal spray as needed.    . isosorbide mononitrate (IMDUR) 30 MG 24 hr tablet Take 1 tablet (30 mg total) by mouth daily. 90 tablet 3  . levothyroxine (SYNTHROID) 200 MCG tablet TAKE 1 TABLET BY MOUTH&nbsp;&nbsp;DAILY AT 6 AM 90 tablet 3  . loratadine (CLARITIN) 10 MG tablet Take 10 mg by mouth daily.     [name] losartan (COZAAR) 50 MG tablet TAKE 1 TABLET EVERY DAY 90 tablet 2  . Magnesium 500 MG CAPS Take 1 capsule (500 mg total) by mouth daily. (Patient taking differently: Take 500 mg by mouth every evening.) 30 capsule 1  . metoprolol tartrate (LOPRESSOR) 25 MG tablet Take 1 tablet (25 mg total) by mouth 2 (two) times daily. 180 tablet 3  . mirabegron ER (MYRBETRIQ) 25 MG TB24 tablet Take 25 mg by mouth at bedtime.     . Multiple Vitamin (MULTIVITAMIN WITH MINERALS) TABS tablet Take 1 tablet by mouth daily. Men's One-A-Day 42+    . nitroGLYCERIN (NITROSTAT) 0.4  MG SL tablet Place 0.4 mg under the tongue every 5 (five) minutes as needed for chest pain. x3 doses as needed for chest pain    . omeprazole (PRILOSEC) 40 MG capsule TAKE 1 CAPSULE BY MOUTH&nbsp;&nbsp;DAILY BEFORE BREAKFAST 90 capsule 3  . Potassium Citrate 15 MEQ (1620 MG) TBCR Take 1 tablet by mouth daily.    . Probiotic Product (PROBIOTIC PO) Take 1 capsule by mouth daily with breakfast.    . Risankizumab-rzaa (SKYRIZI PEN [place]) Inject into the skin.    . rivaroxaban (XARELTO) 10 MG TABS tablet Take 1 tablet (10 mg total) by mouth daily. 90 tablet 1  . testosterone enanthate (DELATESTRYL) 200 MG/ML injection INJECT 1ML (200MG ) INTO THE MUSCLE EVERY 14 DAYS. DISCARD VIAL AFTER 28 DAYS. 5 mL 3  . tiZANidine (ZANAFLEX) 4 MG tablet TAKE 1 TABLET BY MOUTH;EVERY 6 HOURS AS NEEDED FOR MUSCLE SPASM(S) 360 tablet 1  . vitamin C (ASCORBIC ACID) 500 MG  tablet Take 500 mg by mouth daily.    [name] atorvastatin (LIPITOR) 80 MG tablet Take 1 tablet (80 mg total) by mouth daily. 90 tablet 3   No facility-administered medications prior to visit.    ROS: Review of Systems  Constitutional: Negative for appetite change, fatigue and unexpected weight change.  HENT: Negative for congestion, nosebleeds, sneezing, sore throat and trouble swallowing.   Eyes: Negative for itching and visual disturbance.  Respiratory: Negative for cough.   Cardiovascular: Negative for chest pain, palpitations and leg swelling.  Gastrointestinal: Negative for abdominal distention, blood in stool, diarrhea and nausea.  Genitourinary: Negative for frequency and hematuria.  Musculoskeletal: Positive for back pain. Negative for gait problem, joint swelling and neck pain.  Skin: Negative for rash.  Neurological: Negative for dizziness, tremors, speech difficulty and weakness.  Psychiatric/Behavioral: Negative for agitation, dysphoric mood and sleep disturbance. The patient is not nervous/anxious.     Objective:  BP 120/60 (BP Location: Left Arm)   Pulse 72   Temp 97.9 F (36.6 C) (Oral)   Wt (!) 312 lb 6.4 oz (141.7 kg)   SpO2 97%   BMI 38.03 kg/m   BP Readings from Last 3 Encounters:  09/30/20 120/60  09/30/20 120/60  09/09/20 140/75    Wt Readings from Last 3 Encounters:  09/30/20 (!) 312 lb 6.4 oz (141.7 kg)  09/30/20 (!) 312 lb 6.4 oz (141.7 kg)  09/09/20 (!) 307 lb 9.6 oz (139.5 kg)    Physical Exam Constitutional:      General: He is not in acute distress.    Appearance: He is well-developed. He is obese.     Comments: NAD  HENT:     Mouth/Throat:     Mouth: Oropharynx is clear and moist.  Eyes:     Conjunctiva/sclera: Conjunctivae normal.     Pupils: Pupils are equal, round, and reactive to light.  Neck:     Thyroid: No thyromegaly.     Vascular: No JVD.  Cardiovascular:     Rate and Rhythm: Normal rate and regular rhythm.     Pulses: Intact  distal pulses.     Heart sounds: Normal heart sounds. No murmur heard. No friction rub. No gallop.   Pulmonary:     Effort: Pulmonary effort is normal. No respiratory distress.     Breath sounds: Normal breath sounds. No wheezing or rales.  Chest:     Chest wall: No tenderness.  Abdominal:     General: Bowel sounds are normal. There is no distension.  Palpations: Abdomen is soft. There is no mass.     Tenderness: There is no abdominal tenderness. There is no guarding or rebound.  Musculoskeletal:        General: Tenderness present. No edema. Normal range of motion.     Cervical back: Normal range of motion.  Lymphadenopathy:     Cervical: No cervical adenopathy.  Skin:    General: Skin is warm and dry.     Findings: No rash.  Neurological:     Mental Status: He is alert and oriented to person, place, and time.     Cranial Nerves: No cranial nerve deficit.     Motor: No abnormal muscle tone.     Coordination: He displays a negative Romberg sign. Coordination normal.     Gait: Gait normal.     Deep Tendon Reflexes: Reflexes are normal and symmetric.  Psychiatric:        Mood and Affect: Mood and affect normal.        Behavior: Behavior normal.        Thought Content: Thought content normal.        Judgment: Judgment normal.    Walker Lumbar spine, knees with painful range of motion, stiff Lab Results  Component Value Date   WBC 7.3 09/09/2020   HGB 16.7 09/09/2020   HCT 51.3 09/09/2020   PLT 131 (L) 09/09/2020   GLUCOSE 128 (H) 09/09/2020   CHOL 115 10/17/2019   TRIG 47 10/17/2019   HDL 55 10/17/2019   LDLCALC 49 10/17/2019   ALT 31 09/09/2020   AST 23 09/09/2020   NA 136 09/09/2020   K 4.8 09/09/2020   CL 100 09/09/2020   CREATININE 1.25 (H) 09/09/2020   BUN 25 (H) 09/09/2020   CO2 28 09/09/2020   TSH 0.02 Repeated and verified X2. (L) 07/08/2020   PSA 1.08 06/26/2020   INR 1.0 03/26/2019   HGBA1C 5.8 (H) 02/06/2013    No results found.  Assessment &  Plan:    Walker Kehr, MD

## 2020-09-30 NOTE — Assessment & Plan Note (Signed)
Cont w/Xarelto °

## 2020-09-30 NOTE — Assessment & Plan Note (Signed)
In remission.

## 2020-09-30 NOTE — Progress Notes (Addendum)
Subjective:   [name] is a 74 y.o. male who presents for Medicare Annual/Subsequent preventive examination.  Review of Systems    No ROS. Medicare Wellness Visit. Additional risk factors are reflected in social history. Cardiac Risk Factors include: advanced age (>44men, >34 women);dyslipidemia;hypertension;male gender;obesity (BMI >30kg/m2) Sleep Patterns: No sleep issues, feels rested on waking and sleeps 6-8 hours nightly. Home Safety/Smoke Alarms: Feels safe in home; uses home alarm. Smoke alarms in place. Living environment: 2-story home; Lives with spouse; needs for DME; good support system. Seat Belt Safety/Bike Helmet: Wears seat belt.    Objective:    Today's Vitals   09/30/20 1305 09/30/20 1310  BP: 120/60   Pulse: 72   Temp: 97.9 F (36.6 C)   SpO2: 97%   Weight: (!) 312 lb 6.4 oz (141.7 kg)   Height: 6\' 4"  (1.93 m)   PainSc: 0-No pain 0-No pain   Body mass index is 38.03 kg/m.  Advanced Directives 09/30/2020 09/09/2020 06/05/2020 02/04/2020 10/22/2019 08/13/2019 06/15/2019  Does Patient Have a Medical Advance Directive? Yes Yes Yes Yes Yes Yes No  Type of Advance Directive Living will;Healthcare Power of [place];Living will [place];Living will [place];Living will [place];Living will [place];Living will -  Does patient want to make changes to medical advance directive? - - - No - Patient declined No - Patient declined No - Patient declined -  Copy of [place] in Chart? - Yes - validated most recent copy scanned in chart (See row information) - Yes - validated most recent copy scanned in chart (See row information) No - copy requested No - copy requested -  Would patient like information on creating a medical advance directive? - - - - - - No - Patient declined  Pre-existing out of facility DNR order (yellow form or pink MOST form)  - - - - - - -    Current Medications (verified) Outpatient Encounter Medications as of 09/30/2020  Medication Sig   acetaminophen (TYLENOL) 500 MG tablet Take 1,000 mg by mouth daily as needed for moderate pain.   alfuzosin (UROXATRAL) 10 MG 24 hr tablet Take 10 mg by mouth at bedtime.    amoxicillin (AMOXIL) 500 MG capsule Take 2,000 mg by mouth See admin instructions. Take 4 capsules (2000 mg) by mouth 1 hour prior to dental work   aspirin EC 81 MG tablet Take 81 mg by mouth daily.   BELSOMRA 15 MG TABS TAKE 1 TABLET BY MOUTH  DAILY (Patient taking differently: Take 1 tablet by mouth at bedtime as needed.)   buPROPion (WELLBUTRIN XL) 300 MG 24 hr tablet Take 1 tablet (300 mg total) by mouth daily with breakfast.   Calcium Citrate-Vitamin D (CALCIUM CITRATE + D PO) Take 1 tablet by mouth 2 (two) times daily.    Cholecalciferol (VITAMIN D-3 PO) Take 2,000 Units by mouth 2 (two) times daily.    clonazePAM (KLONOPIN) 1 MG tablet Take 1 tablet (1 mg total) by mouth every 6 (six) hours as needed. for anxiety   CRANBERRY PO Take 2 each by mouth in the morning, at noon, and at bedtime.   denosumab (PROLIA) 60 MG/ML SOSY injection Inject 60 mg as directed every 6 (six) months. Last injection: Feb 2020 Next injection: August 2020   diltiazem (CARDIZEM CD) 240 MG 24 hr capsule Take 1 capsule (240 mg total) by mouth daily.   DULoxetine (CYMBALTA)  60 MG capsule Take 1 capsule (60 mg total) by mouth 2 (two) times daily.   ezetimibe (ZETIA) 10 MG tablet TAKE 1 TABLET EVERY DAY.   ferrous sulfate 325 (65 FE) MG tablet Take 325 mg by mouth 2 (two) times daily with a meal.    hydrochlorothiazide (HYDRODIURIL) 12.5 MG tablet Take 1 tablet (12.5 mg total) by mouth daily.   HYDROcodone-acetaminophen (NORCO) 7.5-325 MG tablet Take 1 tablet by mouth every 6 (six) hours as needed for severe pain.   ipratropium (ATROVENT) 0.03 % nasal spray as needed.   isosorbide mononitrate (IMDUR) 30 MG 24 hr tablet Take 1  tablet (30 mg total) by mouth daily.   levothyroxine (SYNTHROID) 200 MCG tablet TAKE 1 TABLET BY MOUTH&nbsp;&nbsp;DAILY AT 6 AM   loratadine (CLARITIN) 10 MG tablet Take 10 mg by mouth daily.    losartan (COZAAR) 50 MG tablet TAKE 1 TABLET EVERY DAY   Magnesium 500 MG CAPS Take 1 capsule (500 mg total) by mouth daily. (Patient taking differently: Take 500 mg by mouth every evening.)   metoprolol tartrate (LOPRESSOR) 25 MG tablet Take 1 tablet (25 mg total) by mouth 2 (two) times daily.   mirabegron ER (MYRBETRIQ) 25 MG TB24 tablet Take 25 mg by mouth at bedtime.    Multiple Vitamin (MULTIVITAMIN WITH MINERALS) TABS tablet Take 1 tablet by mouth daily. Men's One-A-Day 50+   nitroGLYCERIN (NITROSTAT) 0.4 MG SL tablet Place 0.4 mg under the tongue every 5 (five) minutes as needed for chest pain. x3 doses as needed for chest pain   omeprazole (PRILOSEC) 40 MG capsule TAKE 1 CAPSULE BY MOUTH&nbsp;&nbsp;DAILY BEFORE BREAKFAST   Potassium Citrate 15 MEQ (1620 MG) TBCR Take 1 tablet by mouth daily.   Probiotic Product (PROBIOTIC PO) Take 1 capsule by mouth daily with breakfast.   Risankizumab-rzaa (SKYRIZI PEN [place]) Inject into the skin.   rivaroxaban (XARELTO) 10 MG TABS tablet Take 1 tablet (10 mg total) by mouth daily.   testosterone enanthate (DELATESTRYL) 200 MG/ML injection INJECT 1ML (200MG ) INTO THE MUSCLE EVERY 14 DAYS. DISCARD VIAL AFTER 28 DAYS.   tiZANidine (ZANAFLEX) 4 MG tablet TAKE 1 TABLET BY MOUTH;EVERY 6 HOURS AS NEEDED FOR MUSCLE SPASM(S)   vitamin C (ASCORBIC ACID) 500 MG tablet Take 500 mg by mouth daily.   [DISCONTINUED] atorvastatin (LIPITOR) 80 MG tablet Take 1 tablet (80 mg total) by mouth daily.   No facility-administered encounter medications on file as of 09/30/2020.    Allergies (verified) Short ragweed pollen ext   History: Past Medical History:  Diagnosis Date   Anticoagulant long-term use    xarelto--- takes for AFib,  managed by pcp, Dr Alain Marion   Benign  localized prostatic hyperplasia with lower urinary tract symptoms (LUTS)    CAD (coronary artery disease) cardiologist--  dr t. Oval Linsey   hx of known CAD obstruction w/ collaterals (cath done @ [facility] 12-31-2011) ;   last cardiac cath 08-13-2019  showed sig. 2V CAD involving proxLAD and CTO of the RCA/  chronic total occlusion midRCA w/ bridging and L>R collaterals   Diastolic CHF ([place])    dx [id number] hospital admission (followed by pcp)   Diffuse large B cell lymphoma [facility]) oncologist-- dr Irene Limbo--- in remission   dx 08/ 2019 -- bx left tonsill mass-- involving lymph nodes--- completed chemotherapy 10-12-2018   DJD (degenerative joint disease)    Dysthymic disorder    Elevated brain natriuretic peptide (BNP) level    Environmental allergies    GAD (generalized anxiety  disorder)    GERD (gastroesophageal reflux disease)    History of falling    multiple times   History of Graves' disease    1987 s/p total thyroidectomy   History of kidney stones    History of scabies    07/ 2014  resolved   History of syncope    per D/C note in epic 02-21-2013 ?vasovagal   History of vertebral compression fracture    05/ 2014 --  L1, no surgical intervention   HLD (hyperlipidemia)    Hyperlipidemia    Hypertension    Hypogonadism in male    IDA (iron deficiency anemia)    Lumbago    OA (osteoarthritis)    OSA on CPAP    cpap machine-settings 17   Osteoporosis    Severe   Persistent atrial fibrillation [facility])    cardiologist--- dr Alfonso Patten. [place]   Plaque psoriasis    dermatologist--- dr Harriett Sine--- currently taking otezla   Post-surgical hypothyroidism    followed by pcp---  s/p total thyroidectomy for graves disease in 1987   Renal calculus, right    Thrombocytopenia ([place])    Unsteady gait    uses walker   Wears hearing aid in both ears    Past Surgical History:  Procedure Laterality Date   APPENDECTOMY  2011 approx.    CARDIAC CATHETERIZATION  12-31-2011   dr Lenna Sciara. Beatrix Fetters @[facility]     normal LVF w/ multivessel CAD,  occluded RCA w/ colleterals   CATARACT EXTRACTION W/ INTRAOCULAR LENS  IMPLANT, BILATERAL  2016 approx.   COLONOSCOPY     CYSTOSCOPY WITH RETROGRADE PYELOGRAM, URETEROSCOPY AND STENT PLACEMENT Left 11/07/2017   Procedure: CYSTOSCOPY WITH RETROGRADE PYELOGRAM, URETEROSCOPY AND STENT PLACEMENT;  Surgeon: Cleon Gustin, MD;  Location: [facility];  Service: Urology;  Laterality: Left;   CYSTOSCOPY WITH RETROGRADE PYELOGRAM, URETEROSCOPY AND STENT PLACEMENT Right 10/22/2019   Procedure: CYSTOSCOPY WITH RETROGRADE PYELOGRAM, URETEROSCOPY AND STENT PLACEMENT;  Surgeon: Cleon Gustin, MD;  [facility];  Service: Urology;  Laterality: Right;   CYSTOSCOPY/URETEROSCOPY/HOLMIUM LASER/STENT PLACEMENT Right 03/11/2017   Procedure: CYSTOSCOPY/URETEROSCOPYSTENT PLACEMENT right ureter retrograde pylegram;  Surgeon: Cleon Gustin, MD;  [facility];  Service: Urology;  Laterality: Right;   HARDWARE REMOVAL  10/05/2012   Procedure: HARDWARE REMOVAL;  Surgeon: [name], MD;  [facility];  Service: Orthopedics;  Laterality: Right;  REMOVING  STRYKER  GAMMA NAIL   HARDWARE REMOVAL Right 07/03/2013   Procedure: HARDWARE REMOVAL RIGHT TIBIA ;  Surgeon: Rozanna Box, MD;  Location: [place];  Service: Orthopedics;  Laterality: Right;   HIP CLOSED REDUCTION Right 10/15/2013   Procedure: CLOSED MANIPULATION HIP;  Surgeon: [name], MD;  [facility];  Service: Orthopedics;  Laterality: Right;   HOLMIUM LASER APPLICATION Right 05/18/4165   Procedure: HOLMIUM LASER APPLICATION;  Surgeon: Cleon Gustin, MD;  [facility];  Service: Urology;  Laterality: Right;   HOLMIUM LASER APPLICATION Left 0/63/0160   Procedure: HOLMIUM LASER APPLICATION;  Surgeon: Cleon Gustin, MD;  [facility];  Service: Urology;  Laterality: Left;   HOLMIUM LASER APPLICATION Left 1/0/9323   Procedure: HOLMIUM LASER APPLICATION;  Surgeon: Cleon Gustin, MD;  [facility];  Service: Urology;  Laterality: Left;   HOLMIUM LASER APPLICATION Right 5/57/3220   Procedure: HOLMIUM LASER APPLICATION;  Surgeon: Cleon Gustin, MD;  Location: [facility];  Service: Urology;  Laterality: Right;   INCISION AND DRAINAGE  HIP Right 11/16/2013   Procedure: IRRIGATION AND DEBRIDEMENT RIGHT HIP;  Surgeon: [name], MD;  [facility];  Service: Orthopedics;  Laterality: Right;   INTRAVASCULAR PRESSURE WIRE/FFR STUDY N/A 08/13/2019   Procedure: INTRAVASCULAR PRESSURE WIRE/FFR STUDY;  Surgeon: Nelva Bush, MD;  Location: [place] [facility];  Service: Cardiovascular;  Laterality: N/A;   IR IMAGING GUIDED PORT INSERTION  06/22/2018   IR NEPHROSTOMY PLACEMENT LEFT  03/28/2019   IR URETERAL STENT LEFT NEW ACCESS W/O SEP NEPHROSTOMY CATH  10/24/2017   IR URETERAL STENT LEFT NEW ACCESS W/O SEP NEPHROSTOMY CATH  03/26/2019   NEPHROLITHOTOMY Right 02/08/2017   Procedure: NEPHROLITHOTOMY PERCUTANEOUS WITH SURGEON ACCESS;  Surgeon: Cleon Gustin, MD;  [facility];  Service: Urology;  Laterality: Right;   NEPHROLITHOTOMY Left 10/24/2017   Procedure: NEPHROLITHOTOMY PERCUTANEOUS;  Surgeon: Cleon Gustin, MD;  [facility];  Service: Urology;  Laterality: Left;   NEPHROLITHOTOMY Left 03/26/2019   Procedure: NEPHROLITHOTOMY PERCUTANEOUS;  Surgeon: Cleon Gustin, MD;  [facility];  Service: Urology;  Laterality: Left;  2 HRS   NEPHROLITHOTOMY Left 04/17/2019   Procedure: NEPHROLITHOTOMY PERCUTANEOUS;  Surgeon: Cleon Gustin, MD;  [facility];  Service: Urology;  Laterality: Left;  2 HRS   ORIF TIBIA FRACTURE Right 02/06/2013   Procedure: OPEN REDUCTION INTERNAL FIXATION (ORIF) TIBIA FRACTURE WITH IM ROD FIBULA;  Surgeon: Rozanna Box, MD;  Location: [place];  Service: Orthopedics;  Laterality: Right;   ORIF TIBIA FRACTURE Right 07/03/2013   Procedure: RIGHT TIBIA NON UNION REPAIR ;  Surgeon: Rozanna Box, MD;  Location: [place];  Service: Orthopedics;  Laterality: Right;   ORIF WRIST FRACTURE  10/02/2012   Procedure: OPEN REDUCTION INTERNAL FIXATION (ORIF) WRIST FRACTURE;  Surgeon: Roseanne Kaufman, MD;  [facility];  Service: Orthopedics;  Laterality: Right;  WITH   ANTIBIOTIC  CEMENT   ORIF WRIST FRACTURE Left 10/28/2013   Procedure: OPEN REDUCTION INTERNAL FIXATION (ORIF) WRIST FRACTURE with allograft;  Surgeon: Roseanne Kaufman, MD;  [facility];  Service: Orthopedics;  Laterality: Left;  DVR Plate   QUADRICEPS TENDON REPAIR Left 07/15/2017   Procedure: REPAIR QUADRICEP TENDON;  Surgeon: Frederik Pear, MD;  Location: [place];  Service: Orthopedics;  Laterality: Left;   RIGHT/LEFT HEART CATH AND CORONARY ANGIOGRAPHY N/A 08/13/2019   Procedure: RIGHT/LEFT HEART CATH AND CORONARY ANGIOGRAPHY;  Surgeon: Nelva Bush, MD;  Location: [place] [facility];  Service: Cardiovascular;  Laterality: N/A;   THYROIDECTOMY  02/1986   TOTAL HIP ARTHROPLASTY Right 03-16-2016   @[facility]    TOTAL HIP REVISION  10/05/2012   Procedure: TOTAL HIP REVISION;  Surgeon: [name], MD;  [facility];  Service: Orthopedics;  Laterality: Right;  RIGHT TOTAL HIP REVISION   TOTAL HIP REVISION Right 09/17/2013   Procedure: REVISION RIGHT TOTAL HIP ARTHROPLASTY ;  Surgeon: [name], MD;  [facility];  Service: Orthopedics;  Laterality: Right;   TOTAL HIP REVISION Right 10/26/2013   Procedure: REVISION RIGHT TOTAL HIP ARTHROPLASTY;  Surgeon: [name], MD;  [facility];  Service: Orthopedics;  Laterality: Right;   TOTAL KNEE ARTHROPLASTY Bilateral right 03-15-2011;  left 06-30-2011   TOTAL KNEE REVISION Left 04/11/2017   Procedure: TOTAL KNEE REVISION PATELLA and TIBIA;  Surgeon: Frederik Pear, MD;  Location: [place];  Service: Orthopedics;  Laterality: Left;   Family History  Problem Relation Age of Onset   CAD Father 31   Asthma Father  Alcohol abuse Father    Arthritis Mother    Alcohol  abuse Sister    Social History   Socioeconomic History   Marital status: Married    Spouse name: Not on file   Number of children: Not on file   Years of education: Not on file   Highest education level: Not on file  Occupational History   Not on file  Tobacco Use   Smoking status: Former Smoker    Packs/day: 1.00    Years: 50.00    Pack years: 50.00    Types: Cigarettes    Quit date: 01/12/2012    Years since quitting: 8.7   Smokeless tobacco: Never Used  Vaping Use   Vaping Use: Never used  Substance and Sexual Activity   Alcohol use: Not Currently    Comment: occasional-social   Drug use: Never   Sexual activity: Yes  Other Topics Concern   Not on file  Social History Narrative   [place] 2.5 months, went home Feb 21st slipped and fell on back and developed.  Home PT/OT.  Just started outpatient physical therapy.  Friday night, misstepped.     Social Determinants of Health   Financial Resource Strain: Low Risk    Difficulty of Paying Living Expenses: Not hard at all  Food Insecurity: No Food Insecurity   Worried About Charity fundraiser in the Last Year: Never true   [place] in the Last Year: Never true  Transportation Needs: No Transportation Needs   Lack of Transportation (Medical): No   Lack of Transportation (Non-Medical): No  Physical Activity: Sufficiently Active   Days of Exercise per Week: 5 days   Minutes of Exercise per Session: 30 min  Stress: No Stress Concern Present   Feeling of Stress : Not at all  Social Connections: Socially Integrated   Frequency of Communication with Friends and Family: More than three times a week   Frequency of Social Gatherings with Friends and Family: More than three times a week   Attends Religious Services: 1 to 4 times per year   Active Member of [employer] or Organizations: No   Attends Music therapist: 1 to 4 times per year   Marital Status: Married    Tobacco Counseling Counseling given: Not  Answered   Clinical Intake:  Pre-visit preparation completed: Yes  Pain : No/denies pain Pain Score: 0-No pain     BMI - recorded: 38.03 Nutritional Status: BMI > 30  Obese Nutritional Risks: None Diabetes: No  How often do you need to have someone help you when you read instructions, pamphlets, or other written materials from your doctor or pharmacy?: 1 - Never What is the last grade level you completed in school?: 3 Graduate Degrees  Diabetic? no  Interpreter Needed?: No  Information entered by :: Lisette Abu, LPN   Activities of Daily Living In your present state of health, do you have any difficulty performing the following activities: 09/30/2020 10/22/2019  Hearing? Tempie Donning  Comment wears hearing aids -  Vision? N N  Difficulty concentrating or making decisions? N N  Walking or climbing stairs? Y Y  Comment has a stairlift -  Dressing or bathing? N Y  Doing errands, shopping? N -  Preparing Food and eating ? N -  Using the Toilet? N -  In the past six months, have you accidently leaked urine? N -  Do you have problems with loss of bowel control? N -  Managing your Medications? N -  Managing your Finances? N -  Housekeeping or managing your Housekeeping? N -  Some recent data might be hidden    Patient Care Team: Plotnikov, Evie Lacks, MD as PCP - General (Internal Medicine) Skeet Latch, MD as PCP - Cardiology (Cardiology) Brunetta Genera, MD as Consulting Physician (Hematology) Alyson Ingles Candee Furbish, MD as Consulting Physician (Urology) Jackquline Denmark, MD as Consulting Physician (Gastroenterology) Skeet Latch, MD as Attending Physician (Cardiology)  Indicate any recent Medical Services you may have received from other than [facility] providers in the past year (date may be approximate).     Assessment:   This is a routine wellness examination for [place].  Hearing/Vision screen No exam data present  Dietary issues and exercise activities  discussed: Current Exercise Habits: Home exercise routine, Type of exercise: walking, Time (Minutes): 30, Frequency (Times/Week): 5, Weekly Exercise (Minutes/Week): 150, Intensity: Mild, Exercise limited by: orthopedic condition(s);neurologic condition(s);cardiac condition(s);respiratory conditions(s);psychological condition(s)  Goals       Patient Stated (pt-stated)      My goal is continue to walk to strengthen my legs and get back to being active.       Depression Screen PHQ 2/9 Scores 09/30/2020 08/16/2017 05/22/2013 03/19/2013  PHQ - 2 Score 0 0 1 0    Fall Risk Fall Risk  09/30/2020 03/26/2020 08/16/2017 05/22/2013 03/19/2013  Falls in the past year? 0 0 Yes Yes Yes  Number falls in past yr: 0 0 2 or more 2 or more 1  Injury with Fall? 0 0 Yes - -  Risk Factor Category  - - - High Fall Risk -  Risk for fall due to : No Fall Risks;Other (Comment) - - Impaired balance/gait;History of fall(s);Impaired mobility History of fall(s);Impaired mobility  Follow up Falls evaluation completed - - - -    FALL RISK PREVENTION PERTAINING TO THE HOME:  Any stairs in or around the home? Yes  If so, are there any without handrails? No  Home free of loose throw rugs in walkways, pet beds, electrical cords, etc? Yes  Adequate lighting in your home to reduce risk of falls? Yes   ASSISTIVE DEVICES UTILIZED TO PREVENT FALLS:  Life alert? No  Use of a cane, walker or w/c? Yes  Grab bars in the bathroom? Yes  Shower chair or bench in shower? Yes  Elevated toilet seat or a handicapped toilet? Yes   TIMED UP AND GO:  Was the test performed? No .  Length of time to ambulate 10 feet: 0 sec.   Gait slow and steady with assistive device  Cognitive Function: Normal cognitive status assessed by direct observation by this Nurse Health Advisor. No abnormalities found.         Immunizations Immunization History  Administered Date(s) Administered   Fluad Quad(high Dose 65+) 06/26/2020   Influenza,  High Dose Seasonal PF 07/12/2015, 07/29/2015, 04/28/2017, 08/16/2017, 09/06/2018   Influenza,inj,Quad PF,6+ Mos 07/05/2013, 07/17/2018   PFIZER SARS-COV-2 Vaccination 12/04/2019, 12/25/2019, 05/12/2020, 06/05/2020   PPD Test 03/19/2016, 11/05/2016, 11/18/2016   Pneumococcal Conjugate-13 12/03/2015   Pneumococcal Polysaccharide-23 10/11/2009, 08/07/2015, 04/28/2017, 08/16/2017   Td 10/11/2012    TDAP status: Up to date  Flu Vaccine status: Up to date  Pneumococcal vaccine status: Up to date  Covid-19 vaccine status: Completed vaccines  Qualifies for Shingles Vaccine? Yes   Zostavax completed No   Shingrix Completed?: No.    Education has been provided regarding the importance of this vaccine. Patient has been advised to call  insurance company to determine out of pocket expense if they have not yet received this vaccine. Advised may also receive vaccine at local pharmacy or Health Dept. Verbalized acceptance and understanding.  Screening Tests Health Maintenance  Topic Date Due   COLONOSCOPY  Never done   COVID-19 Vaccine (4 - Booster for Pfizer series) 12/06/2020   TETANUS/TDAP  10/11/2022   INFLUENZA VACCINE  Completed   Hepatitis C Screening  Completed   PNA vac Low Risk Adult  Completed    Health Maintenance  Health Maintenance Due  Topic Date Due   COLONOSCOPY  Never done    Colorectal cancer screening: No longer required.   Lung Cancer Screening: (Low Dose CT Chest recommended if Age 15-80 years, 30 pack-year currently smoking OR have quit w/in 15years.) does not qualify.   Lung Cancer Screening Referral: no  Additional Screening:  Hepatitis C Screening: does qualify; Completed yes  Vision Screening: Recommended annual ophthalmology exams for early detection of glaucoma and other disorders of the eye. Is the patient up to date with their annual eye exam?  Yes  Who is the provider or what is the name of the office in which the patient attends annual eye exams?  Cleon Gustin, OD If pt is not established with a provider, would they like to be referred to a provider to establish care? No .   Dental Screening: Recommended annual dental exams for proper oral hygiene  Community Resource Referral / Chronic Care Management: CRR required this visit?  No   CCM required this visit?  No      Plan:     I have personally reviewed and noted the following in the patient's chart:   Medical and social history Use of alcohol, tobacco or illicit drugs  Current medications and supplements Functional ability and status Nutritional status Physical activity Advanced directives List of other physicians Hospitalizations, surgeries, and ER visits in previous 12 months Vitals Screenings to include cognitive, depression, and falls Referrals and appointments  In addition, I have reviewed and discussed with patient certain preventive protocols, quality metrics, and best practice recommendations. A written personalized care plan for preventive services as well as general preventive health recommendations were provided to patient.     Sheral Flow, LPN   70/96/2836   Nurse Notes: n/a  Medical screening examination/treatment/procedure(s) were performed by non-physician practitioner and as supervising physician I was immediately available for consultation/collaboration.  I agree with above. Lew Dawes, MD

## 2020-10-06 DIAGNOSIS — J3089 Other allergic rhinitis: Secondary | ICD-10-CM | POA: Diagnosis not present

## 2020-10-06 DIAGNOSIS — J301 Allergic rhinitis due to pollen: Secondary | ICD-10-CM | POA: Diagnosis not present

## 2020-10-20 DIAGNOSIS — F410 Panic disorder [episodic paroxysmal anxiety] without agoraphobia: Secondary | ICD-10-CM | POA: Diagnosis not present

## 2020-10-20 DIAGNOSIS — F331 Major depressive disorder, recurrent, moderate: Secondary | ICD-10-CM | POA: Diagnosis not present

## 2020-10-27 ENCOUNTER — Ambulatory Visit (INDEPENDENT_AMBULATORY_CARE_PROVIDER_SITE_OTHER): Payer: Medicare PPO | Admitting: Otolaryngology

## 2020-11-06 DIAGNOSIS — J301 Allergic rhinitis due to pollen: Secondary | ICD-10-CM | POA: Diagnosis not present

## 2020-11-06 DIAGNOSIS — J3089 Other allergic rhinitis: Secondary | ICD-10-CM | POA: Diagnosis not present

## 2020-11-10 ENCOUNTER — Other Ambulatory Visit: Payer: Self-pay

## 2020-11-10 ENCOUNTER — Ambulatory Visit (INDEPENDENT_AMBULATORY_CARE_PROVIDER_SITE_OTHER): Payer: Medicare PPO | Admitting: Otolaryngology

## 2020-11-10 VITALS — Temp 97.5°F

## 2020-11-10 DIAGNOSIS — H903 Sensorineural hearing loss, bilateral: Secondary | ICD-10-CM

## 2020-11-10 DIAGNOSIS — H6123 Impacted cerumen, bilateral: Secondary | ICD-10-CM

## 2020-11-10 NOTE — Progress Notes (Signed)
HPI: [name] is a 75 y.o. male who presents for evaluation of ear cleaning prior to getting new hearing aids. He has a long history of recurrent wax buildup.[name]  Past Medical History:  Diagnosis Date  . Anticoagulant long-term use    xarelto--- takes for AFib,  managed by pcp, Dr Alain Marion  . Benign localized prostatic hyperplasia with lower urinary tract symptoms (LUTS)   . CAD (coronary artery disease) cardiologist--  dr t. Oval Linsey   hx of known CAD obstruction w/ collaterals (cath done @ [facility] 12-31-2011) ;   last cardiac cath 08-13-2019  showed sig. 2V CAD involving proxLAD and CTO of the RCA/  chronic total occlusion midRCA w/ bridging and L>R collaterals  . Diastolic CHF ([place])    dx [id number] hospital admission (followed by pcp)  . Diffuse large B cell lymphoma [facility]) oncologist-- dr Irene Limbo--- in remission   dx 08/ 2019 -- bx left tonsill mass-- involving lymph nodes--- completed chemotherapy 10-12-2018  . DJD (degenerative joint disease)   . Dysthymic disorder   . Elevated brain natriuretic peptide (BNP) level   . Environmental allergies   . GAD (generalized anxiety disorder)   . GERD (gastroesophageal reflux disease)   . History of falling    multiple times  . History of Graves' disease    1987 s/p total thyroidectomy  . History of kidney stones   . History of scabies    07/ 2014  resolved  . History of syncope    per D/C note in epic 02-21-2013 ?vasovagal  . History of vertebral compression fracture    05/ 2014 --  L1, no surgical intervention  . HLD (hyperlipidemia)   . Hyperlipidemia   . Hypertension   . Hypogonadism in male   . IDA (iron deficiency anemia)   . Lumbago   . OA (osteoarthritis)   . OSA on CPAP    cpap machine-settings 17  . Osteoporosis    Severe  . Persistent atrial fibrillation [facility])    cardiologist--- dr r. Oval Linsey  . Plaque psoriasis    dermatologist--- dr Harriett Sine--- currently taking otezla  . Post-surgical hypothyroidism     followed by pcp---  s/p total thyroidectomy for graves disease in 1987  . Renal calculus, right   . Thrombocytopenia (Accident)   . Unsteady gait    uses walker  . Wears hearing aid in both ears    Past Surgical History:  Procedure Laterality Date  . APPENDECTOMY  2011 approx.   [name] CARDIAC CATHETERIZATION  12-31-2011   dr Lenna Sciara. Beatrix Fetters @[facility]    normal LVF w/ multivessel CAD,  occluded RCA w/ colleterals  . CATARACT EXTRACTION W/ INTRAOCULAR LENS  IMPLANT, BILATERAL  2016 approx.  . COLONOSCOPY    . CYSTOSCOPY WITH RETROGRADE PYELOGRAM, URETEROSCOPY AND STENT PLACEMENT Left 11/07/2017   Procedure: CYSTOSCOPY WITH RETROGRADE PYELOGRAM, URETEROSCOPY AND STENT PLACEMENT;  Surgeon: Cleon Gustin, MD;  Location: [facility];  Service: Urology;  Laterality: Left;  . CYSTOSCOPY WITH RETROGRADE PYELOGRAM, URETEROSCOPY AND STENT PLACEMENT Right 10/22/2019   Procedure: CYSTOSCOPY WITH RETROGRADE PYELOGRAM, URETEROSCOPY AND STENT PLACEMENT;  Surgeon: Cleon Gustin, MD;  Location: [facility];  Service: Urology;  Laterality: Right;  . CYSTOSCOPY/URETEROSCOPY/HOLMIUM LASER/STENT PLACEMENT Right 03/11/2017   Procedure: CYSTOSCOPY/URETEROSCOPYSTENT PLACEMENT right ureter retrograde pylegram;  Surgeon: Cleon Gustin, MD;  [facility];  Service: Urology;  Laterality: Right;  . HARDWARE REMOVAL  10/05/2012   Procedure: HARDWARE REMOVAL;  Surgeon: [name], MD;  Location:  [facility];  Service: Orthopedics;  Laterality: Right;  REMOVING  STRYKER  GAMMA NAIL  . HARDWARE REMOVAL Right 07/03/2013   Procedure: HARDWARE REMOVAL RIGHT TIBIA ;  Surgeon: Rozanna Box, MD;  Location: [place];  Service: Orthopedics;  Laterality: Right;  . HIP CLOSED REDUCTION Right 10/15/2013   Procedure: CLOSED MANIPULATION HIP;  Surgeon: [name], MD;  [facility];  Service: Orthopedics;  Laterality: Right;  . HOLMIUM LASER APPLICATION Right [id number]   Procedure: HOLMIUM LASER APPLICATION;  Surgeon:  Cleon Gustin, MD;  [facility];  Service: Urology;  Laterality: Right;  . HOLMIUM LASER APPLICATION Left [id number]   Procedure: HOLMIUM LASER APPLICATION;  Surgeon: Cleon Gustin, MD;  [facility];  Service: Urology;  Laterality: Left;  . HOLMIUM LASER APPLICATION Left [id number]   Procedure: HOLMIUM LASER APPLICATION;  Surgeon: Cleon Gustin, MD;  [facility];  Service: Urology;  Laterality: Left;  . HOLMIUM LASER APPLICATION Right [id number]   Procedure: HOLMIUM LASER APPLICATION;  Surgeon: Cleon Gustin, MD;  Location: [facility];  Service: Urology;  Laterality: Right;  . INCISION AND DRAINAGE HIP Right 11/16/2013   Procedure: IRRIGATION AND DEBRIDEMENT RIGHT HIP;  Surgeon: [name], MD;  [facility];  Service: Orthopedics;  Laterality: Right;  . INTRAVASCULAR PRESSURE WIRE/FFR STUDY N/A 08/13/2019   Procedure: INTRAVASCULAR PRESSURE WIRE/FFR STUDY;  Surgeon: Nelva Bush, MD;  Location: [place] [facility];  Service: Cardiovascular;  Laterality: N/A;  . IR IMAGING GUIDED PORT INSERTION  06/22/2018  . IR NEPHROSTOMY PLACEMENT LEFT  03/28/2019  . IR URETERAL STENT LEFT NEW ACCESS W/O SEP NEPHROSTOMY CATH  10/24/2017  . IR URETERAL STENT LEFT NEW ACCESS W/O SEP NEPHROSTOMY CATH  03/26/2019  . NEPHROLITHOTOMY Right 02/08/2017   Procedure: NEPHROLITHOTOMY PERCUTANEOUS WITH SURGEON ACCESS;  Surgeon: Cleon Gustin, MD;  [facility];  Service: Urology;  Laterality: Right;  . NEPHROLITHOTOMY Left 10/24/2017   Procedure: NEPHROLITHOTOMY PERCUTANEOUS;  Surgeon: Cleon Gustin, MD;  [facility];  Service: Urology;  Laterality: Left;  . NEPHROLITHOTOMY Left 03/26/2019   Procedure: NEPHROLITHOTOMY PERCUTANEOUS;  Surgeon: Cleon Gustin, MD;  [facility];  Service: Urology;  Laterality: Left;  2 HRS  . NEPHROLITHOTOMY Left 04/17/2019   Procedure: NEPHROLITHOTOMY PERCUTANEOUS;  Surgeon: Cleon Gustin, MD;  [facility];  Service: Urology;  Laterality: Left;  2 HRS  . ORIF TIBIA FRACTURE Right 02/06/2013   Procedure: OPEN REDUCTION INTERNAL FIXATION (ORIF) TIBIA FRACTURE WITH IM ROD FIBULA;  Surgeon: Rozanna Box, MD;  Location: [place];  Service: Orthopedics;  Laterality: Right;  . ORIF TIBIA FRACTURE Right 07/03/2013   Procedure: RIGHT TIBIA NON UNION REPAIR ;  Surgeon: Rozanna Box, MD;  Location: [place];  Service: Orthopedics;  Laterality: Right;  . ORIF WRIST FRACTURE  10/02/2012   Procedure: OPEN REDUCTION INTERNAL FIXATION (ORIF) WRIST FRACTURE;  Surgeon: Roseanne Kaufman, MD;  [facility];  Service: Orthopedics;  Laterality: Right;  WITH   ANTIBIOTIC  CEMENT  . ORIF WRIST FRACTURE Left 10/28/2013   Procedure: OPEN REDUCTION INTERNAL FIXATION (ORIF) WRIST FRACTURE with allograft;  Surgeon: Roseanne Kaufman, MD;  [facility];  Service: Orthopedics;  Laterality: Left;  DVR Plate  . QUADRICEPS TENDON REPAIR Left 07/15/2017   Procedure: REPAIR QUADRICEP TENDON;  Surgeon: Frederik Pear, MD;  Location: [place];  Service: Orthopedics;  Laterality: Left;  . RIGHT/LEFT HEART CATH AND CORONARY ANGIOGRAPHY N/A 08/13/2019  Procedure: RIGHT/LEFT HEART CATH AND CORONARY ANGIOGRAPHY;  Surgeon: Nelva Bush, MD;  Location: [place] [facility];  Service: Cardiovascular;  Laterality: N/A;  . THYROIDECTOMY  02/1986  . TOTAL HIP ARTHROPLASTY Right 03-16-2016   @[facility]   . TOTAL HIP REVISION  10/05/2012   Procedure: TOTAL HIP REVISION;  Surgeon: [name], MD;  [facility];  Service: Orthopedics;  Laterality: Right;  RIGHT TOTAL HIP REVISION  . TOTAL HIP REVISION Right 09/17/2013   Procedure: REVISION RIGHT TOTAL HIP ARTHROPLASTY ;  Surgeon: [name], MD;  [facility];  Service: Orthopedics;  Laterality: Right;  . TOTAL HIP REVISION Right 10/26/2013   Procedure: REVISION RIGHT TOTAL HIP ARTHROPLASTY;  Surgeon: [name], MD;  [facility];  Service: Orthopedics;  Laterality: Right;  . TOTAL  KNEE ARTHROPLASTY Bilateral right 03-15-2011;  left 06-30-2011  . TOTAL KNEE REVISION Left 04/11/2017   Procedure: TOTAL KNEE REVISION PATELLA and TIBIA;  Surgeon: Frederik Pear, MD;  Location: [place];  Service: Orthopedics;  Laterality: Left;   Social History   Socioeconomic History  . Marital status: Married    Spouse name: Not on file  . Number of children: Not on file  . Years of education: Not on file  . Highest education level: Not on file  Occupational History  . Not on file  Tobacco Use  . Smoking status: Former Smoker    Packs/day: 1.00    Years: 50.00    Pack years: 50.00    Types: Cigarettes    Quit date: 01/12/2012    Years since quitting: 8.8  . Smokeless tobacco: Never Used  Vaping Use  . Vaping Use: Never used  Substance and Sexual Activity  . Alcohol use: Not Currently    Comment: occasional-social  . Drug use: Never  . Sexual activity: Yes  Other Topics Concern  . Not on file  Social History Narrative   [place] 2.5 months, went home Feb 21st slipped and fell on back and developed.  Home PT/OT.  Just started outpatient physical therapy.  Friday night, misstepped.     Social Determinants of Health   Financial Resource Strain: Low Risk   . Difficulty of Paying Living Expenses: Not hard at all  Food Insecurity: No Food Insecurity  . Worried About Charity fundraiser in the Last Year: Never true  . Ran Out of Food in the Last Year: Never true  Transportation Needs: No Transportation Needs  . Lack of Transportation (Medical): No  . Lack of Transportation (Non-Medical): No  Physical Activity: Sufficiently Active  . Days of Exercise per Week: 5 days  . Minutes of Exercise per Session: 30 min  Stress: No Stress Concern Present  . Feeling of Stress : Not at all  Social Connections: Socially Integrated  . Frequency of Communication with Friends and Family: More than three times a week  . Frequency of Social Gatherings with Friends and Family: More than three times a  week  . Attends Religious Services: 1 to 4 times per year  . Active Member of Clubs or Organizations: No  . Attends Archivist Meetings: 1 to 4 times per year  . Marital Status: Married   Family History  Problem Relation Age of Onset  . CAD Father 77  . Asthma Father   . Alcohol abuse Father   . Arthritis Mother   . Alcohol abuse Sister    Allergies  Allergen Reactions  . Short Ragweed Pollen Ext Cough  Prior to Admission medications   Medication Sig Start Date End Date Taking? Authorizing Provider  acetaminophen (TYLENOL) 500 MG tablet Take 1,000 mg by mouth daily as needed for moderate pain.    [provider]  alfuzosin (UROXATRAL) 10 MG 24 hr tablet Take 10 mg by mouth at bedtime.     [provider]  amoxicillin (AMOXIL) 500 MG capsule Take 2,000 mg by mouth See admin instructions. Take 4 capsules (2000 mg) by mouth 1 hour prior to dental work    [provider]  aspirin EC 81 MG tablet Take 81 mg by mouth daily.    [provider]  BELSOMRA 15 MG TABS TAKE 1 TABLET BY MOUTH  DAILY Patient taking differently: Take 1 tablet by mouth at bedtime as needed. 09/18/19   Plotnikov, Evie Lacks, MD  buPROPion (WELLBUTRIN XL) 300 MG 24 hr tablet Take 1 tablet (300 mg total) by mouth daily with breakfast. 04/27/20   Plotnikov, Evie Lacks, MD  Calcium Citrate-Vitamin D (CALCIUM CITRATE + D PO) Take 1 tablet by mouth 2 (two) times daily.     [provider]  Cholecalciferol (VITAMIN D-3 PO) Take 2,000 Units by mouth 2 (two) times daily.     [provider]  clonazePAM (KLONOPIN) 1 MG tablet Take 1 tablet (1 mg total) by mouth every 6 (six) hours as needed. for anxiety 04/27/20   Plotnikov, Evie Lacks, MD  CRANBERRY PO Take 2 each by mouth in the morning, at noon, and at bedtime.    [provider]  denosumab (PROLIA) 60 MG/ML SOSY injection Inject 60 mg as directed every 6 (six) months. Last injection: Feb 2020 Next injection:  August 2020 12/10/15   [provider]  diltiazem (CARDIZEM CD) 240 MG 24 hr capsule Take 1 capsule (240 mg total) by mouth daily. 04/28/20   Plotnikov, Evie Lacks, MD  DULoxetine (CYMBALTA) 60 MG capsule Take 1 capsule (60 mg total) by mouth 2 (two) times daily. 04/28/20   Plotnikov, Evie Lacks, MD  ezetimibe (ZETIA) 10 MG tablet TAKE 1 TABLET EVERY DAY. 09/01/20   Skeet Latch, MD  ferrous sulfate 325 (65 FE) MG tablet Take 325 mg by mouth 2 (two) times daily with a meal.     [provider]  hydrochlorothiazide (HYDRODIURIL) 12.5 MG tablet Take 1 tablet (12.5 mg total) by mouth daily. 08/04/20   Skeet Latch, MD  HYDROcodone-acetaminophen (NORCO) 7.5-325 MG tablet Take 1 tablet by mouth every 6 (six) hours as needed for severe pain. 09/30/20   Plotnikov, Evie Lacks, MD  ipratropium (ATROVENT) 0.03 % nasal spray as needed. 02/23/20   [provider]  isosorbide mononitrate (IMDUR) 30 MG 24 hr tablet Take 1 tablet (30 mg total) by mouth daily. 08/04/20   Skeet Latch, MD  levothyroxine (SYNTHROID) 200 MCG tablet TAKE 1 TABLET BY MOUTH&nbsp;&nbsp;DAILY AT 6 AM 03/31/20   Plotnikov, Evie Lacks, MD  loratadine (CLARITIN) 10 MG tablet Take 10 mg by mouth daily.     [provider]  losartan (COZAAR) 50 MG tablet TAKE 1 TABLET EVERY DAY 07/14/20   Skeet Latch, MD  Magnesium 500 MG CAPS Take 1 capsule (500 mg total) by mouth daily. Patient taking differently: Take 500 mg by mouth every evening. 10/13/18   Brunetta Genera, MD  metoprolol tartrate (LOPRESSOR) 25 MG tablet Take 1 tablet (25 mg total) by mouth 2 (two) times daily. 08/04/20   Skeet Latch, MD  mirabegron ER (MYRBETRIQ) 25 MG TB24 tablet Take  25 mg by mouth at bedtime.     [provider]  Multiple Vitamin (MULTIVITAMIN WITH MINERALS) TABS tablet Take 1 tablet by mouth daily. Men's One-A-Day 50+    [provider]  nitroGLYCERIN (NITROSTAT) 0.4 MG SL tablet Place 0.4 mg  under the tongue every 5 (five) minutes as needed for chest pain. x3 doses as needed for chest pain    [provider]  omeprazole (PRILOSEC) 40 MG capsule TAKE 1 CAPSULE BY MOUTH&nbsp;&nbsp;DAILY BEFORE BREAKFAST 10/30/19   Plotnikov, Evie Lacks, MD  Potassium Citrate 15 MEQ (1620 MG) TBCR Take 1 tablet by mouth daily. 03/04/20   [provider]  Probiotic Product (PROBIOTIC PO) Take 1 capsule by mouth daily with breakfast.    [provider]  Risankizumab-rzaa (SKYRIZI PEN [place]) Inject into the skin.    [provider]  rivaroxaban (XARELTO) 10 MG TABS tablet Take 1 tablet (10 mg total) by mouth daily. 10/30/19   Plotnikov, Evie Lacks, MD  testosterone enanthate (DELATESTRYL) 200 MG/ML injection INJECT 1ML (200MG ) INTO THE MUSCLE EVERY 14 DAYS. DISCARD VIAL AFTER 28 DAYS. 07/14/20   Plotnikov, Evie Lacks, MD  tiZANidine (ZANAFLEX) 4 MG tablet TAKE 1 TABLET BY MOUTH;EVERY 6 HOURS AS NEEDED FOR MUSCLE SPASM(S) 10/30/19   Plotnikov, Evie Lacks, MD  vitamin C (ASCORBIC ACID) 500 MG tablet Take 500 mg by mouth daily.    [provider]     Positive ROS: Otherwise negative  All other systems have been reviewed and were otherwise negative with the exception of those mentioned in the HPI and as above.  Physical Exam: Constitutional: Alert, well-appearing, no acute distress Ears: External ears without lesions or tenderness. Ear canals large amount of wax obstructing the right ear canal mild amount of wax obstructing the left ear canal. Ear canals were cleaned with curette and forceps. TMs were clear bilaterally.. Nasal: External nose without lesions. Clear nasal passages Oral: Oropharynx clear. Neck: No palpable adenopathy or masses Respiratory: Breathing comfortably  Skin: No facial/neck lesions or rash noted.  Cerumen impaction removal  Date/Time: 11/10/2020 2:08 PM Performed by: Rozetta Nunnery, MD Authorized by: Rozetta Nunnery, MD   Consent:     Consent obtained:  Verbal   Consent given by:  Patient   Risks discussed:  Pain and bleeding Procedure details:    Location:  L ear and R ear   Procedure type: curette and forceps   Post-procedure details:    Inspection:  TM intact and canal normal   Hearing quality:  Improved   Patient tolerance of procedure:  Tolerated well, no immediate complications Comments:     TMs are clear bilaterally.    Assessment: Bilateral wax buildup in patient getting new hearing aids.  Plan: This was cleaned in the office. He will follow-up as needed.  Radene Journey, MD

## 2020-11-11 DIAGNOSIS — F410 Panic disorder [episodic paroxysmal anxiety] without agoraphobia: Secondary | ICD-10-CM | POA: Diagnosis not present

## 2020-11-11 DIAGNOSIS — F331 Major depressive disorder, recurrent, moderate: Secondary | ICD-10-CM | POA: Diagnosis not present

## 2020-11-12 DIAGNOSIS — L814 Other melanin hyperpigmentation: Secondary | ICD-10-CM | POA: Diagnosis not present

## 2020-11-12 DIAGNOSIS — Z85828 Personal history of other malignant neoplasm of skin: Secondary | ICD-10-CM | POA: Diagnosis not present

## 2020-11-12 DIAGNOSIS — L720 Epidermal cyst: Secondary | ICD-10-CM | POA: Diagnosis not present

## 2020-11-12 DIAGNOSIS — D1801 Hemangioma of skin and subcutaneous tissue: Secondary | ICD-10-CM | POA: Diagnosis not present

## 2020-11-12 DIAGNOSIS — L570 Actinic keratosis: Secondary | ICD-10-CM | POA: Diagnosis not present

## 2020-11-12 DIAGNOSIS — D045 Carcinoma in situ of skin of trunk: Secondary | ICD-10-CM | POA: Diagnosis not present

## 2020-11-12 DIAGNOSIS — L400 Psoriasis vulgaris: Secondary | ICD-10-CM | POA: Diagnosis not present

## 2020-11-12 DIAGNOSIS — L821 Other seborrheic keratosis: Secondary | ICD-10-CM | POA: Diagnosis not present

## 2020-11-12 DIAGNOSIS — D225 Melanocytic nevi of trunk: Secondary | ICD-10-CM | POA: Diagnosis not present

## 2020-11-20 DIAGNOSIS — H903 Sensorineural hearing loss, bilateral: Secondary | ICD-10-CM | POA: Diagnosis not present

## 2020-11-24 ENCOUNTER — Other Ambulatory Visit: Payer: Self-pay | Admitting: Internal Medicine

## 2020-11-26 DIAGNOSIS — R7989 Other specified abnormal findings of blood chemistry: Secondary | ICD-10-CM | POA: Diagnosis not present

## 2020-11-26 DIAGNOSIS — E039 Hypothyroidism, unspecified: Secondary | ICD-10-CM | POA: Diagnosis not present

## 2020-11-26 DIAGNOSIS — Z5181 Encounter for therapeutic drug level monitoring: Secondary | ICD-10-CM | POA: Diagnosis not present

## 2020-11-26 DIAGNOSIS — Z8719 Personal history of other diseases of the digestive system: Secondary | ICD-10-CM | POA: Diagnosis not present

## 2020-11-26 DIAGNOSIS — K219 Gastro-esophageal reflux disease without esophagitis: Secondary | ICD-10-CM | POA: Diagnosis not present

## 2020-11-26 DIAGNOSIS — Z9221 Personal history of antineoplastic chemotherapy: Secondary | ICD-10-CM | POA: Diagnosis not present

## 2020-11-26 DIAGNOSIS — M810 Age-related osteoporosis without current pathological fracture: Secondary | ICD-10-CM | POA: Diagnosis not present

## 2020-11-26 DIAGNOSIS — F32A Depression, unspecified: Secondary | ICD-10-CM | POA: Diagnosis not present

## 2020-11-26 DIAGNOSIS — C8590 Non-Hodgkin lymphoma, unspecified, unspecified site: Secondary | ICD-10-CM | POA: Diagnosis not present

## 2020-11-26 DIAGNOSIS — I480 Paroxysmal atrial fibrillation: Secondary | ICD-10-CM | POA: Diagnosis not present

## 2020-11-26 DIAGNOSIS — Z9181 History of falling: Secondary | ICD-10-CM | POA: Diagnosis not present

## 2020-11-27 ENCOUNTER — Telehealth: Payer: Self-pay | Admitting: Internal Medicine

## 2020-11-27 DIAGNOSIS — R3121 Asymptomatic microscopic hematuria: Secondary | ICD-10-CM | POA: Diagnosis not present

## 2020-11-27 DIAGNOSIS — N200 Calculus of kidney: Secondary | ICD-10-CM | POA: Diagnosis not present

## 2020-11-27 DIAGNOSIS — R350 Frequency of micturition: Secondary | ICD-10-CM | POA: Diagnosis not present

## 2020-11-27 MED ORDER — CLONAZEPAM 1 MG PO TABS
1.0000 mg | ORAL_TABLET | Freq: Four times a day (QID) | ORAL | 1 refills | Status: DC | PRN
Start: 2020-11-27 — End: 2021-02-20

## 2020-11-27 NOTE — Telephone Encounter (Signed)
[employer] calling  to request refill for clonazePAM (KLONOPIN) 1 MG tablet

## 2020-11-27 NOTE — Telephone Encounter (Signed)
Thanks, okay.

## 2020-12-01 DIAGNOSIS — B351 Tinea unguium: Secondary | ICD-10-CM | POA: Diagnosis not present

## 2020-12-01 DIAGNOSIS — I739 Peripheral vascular disease, unspecified: Secondary | ICD-10-CM | POA: Diagnosis not present

## 2020-12-05 ENCOUNTER — Other Ambulatory Visit: Payer: Self-pay

## 2020-12-05 DIAGNOSIS — C8330 Diffuse large B-cell lymphoma, unspecified site: Secondary | ICD-10-CM

## 2020-12-07 NOTE — Progress Notes (Signed)
HEMATOLOGY/ONCOLOGY CLINIC NOTE  Date of Service: 12/08/2020  Patient Care Team: Cassandria Anger, MD as PCP - General (Internal Medicine) Skeet Latch, MD as PCP - Cardiology (Cardiology) Brunetta Genera, MD as Consulting Physician (Hematology) McKenzie, Candee Furbish, MD as Consulting Physician (Urology) Jackquline Denmark, MD as Consulting Physician (Gastroenterology) Skeet Latch, MD as Attending Physician (Cardiology) Lynnell Dike, OD as Consulting Physician (Optometry)  CHIEF COMPLAINTS/PURPOSE OF CONSULTATION:  Follow Up for Diffuse Large B-Cell Lymphoma   HISTORY OF PRESENTING ILLNESS:   [name] is a wonderful 75 y.o. male who has been referred to [place] by Dr. Lew Dawes for evaluation and management of Diffuse Large B-Cell Lymphoma. He is accompanied today by his partner. The pt reports that he is doing well overall.   The pt reports that he presented to ENT Dr. Pollie Friar care after developing a cough and a new nodule in his mouth. He notes that the symptoms presented about a month ago and have not progressed or significantly worsened. He notes that he has frequent sinus drainage, and felt that this enlargement could initially be related to this. He denies any changes in his voice and difficulty breathing.   The pt notes that he is finishing three months of PT tomorrow after surgeries on his right leg and left knee. He notes that he takes blood thinners for Afib and uses his CPAP regularly now. The pt also receives testosterone injections, 200mg  every other week. The pt notes that he had several surgeries bilaterally to remove large kidney stones in the past. He notes that his thyroid replacement has been stable. His vertebral compression fracture was in 2013, after a fall in which he also broke his right femur and wrist. The pt also takes Prolia every 6 months for his osteoporosis. He has had 18 surgeries on his right leg.   He notes that his psoriasis  has been stable and takes Enbrel twice a week, and has taken this for 5 years. He was on 10 years of Humara before this. He was on Methotrexate prior to [facility].   The pt notes that he functions okay at home and is able to drive himself and cook for himself and feels that he can function independently with limitations on lifting heavier objects.   On review of systems, pt reports recent cough, left tonsil mass, stable energy levels, and denies fevers, chills, night sweats, unexpected weight loss, difficulty breathing, difficulty swallowing, noticing any other lumps or bumps, pain along the spine, recent infections, abdominal pains, difficulty urinating, leg swelling, and any other symptoms.  . On Social Hx the pt reports that he smoked cigarettes for many years until he quit 6 years ago, and drinks ETOH socially.    INTERVAL HISTORY   [name] is here for management and evaluation, after completing 6 planned cycles of R-CHOP, for followup and active surveillance of his Diffuse Large B-Cell Lymphoma. The patient's last visit with [place] was on 09/08/2020. The pt reports that he is doing well overall.  The pt reports no new symptoms or concerns. He noes some weight loss due to decreased activity. The pt notes he has been able to do all activities as needed at this time with use of his walker. The pt notes one spot on his neck that goes down with palpitation. The pt is transitioning to a new Urologist for his frequent urination and is monitoring that. The pt notes he stays tired a lot due to this.  The pt experienced one UTI two weeks ago and has finished the antibiotics since. The pt notes Orson Ape is controlling his Psoriasis very well.  Lab results today 12/08/2020 of CBC w/diff and CMP is as follows: all values are WNL except for Glucose of 166, BUN of 26, Creatinine of 1.41, GFR est of 52. 12/08/2020 LDH of 236.  On review of systems, pt reports small mass in neck, urinary issues, minor fatigue  and denies changes in bowel habits, abdominal pain, new bone pains, back pain, leg swelling, infection issues, joint pain, and any other symptoms.  MEDICAL HISTORY:  Past Medical History:  Diagnosis Date   Anticoagulant long-term use    xarelto--- takes for AFib,  managed by pcp, Dr Alain Marion   Benign localized prostatic hyperplasia with lower urinary tract symptoms (LUTS)    CAD (coronary artery disease) cardiologist--  dr t. Oval Linsey   hx of known CAD obstruction w/ collaterals (cath done @ [facility] 12-31-2011) ;   last cardiac cath 08-13-2019  showed sig. 2V CAD involving proxLAD and CTO of the RCA/  chronic total occlusion midRCA w/ bridging and L>R collaterals   Diastolic CHF ([place])    dx 16-10-930 hospital admission (followed by pcp)   Diffuse large B cell lymphoma [facility]) oncologist-- dr Irene Limbo--- in remission   dx 08/ 2019 -- bx left tonsill mass-- involving lymph nodes--- completed chemotherapy 10-12-2018   DJD (degenerative joint disease)    Dysthymic disorder    Elevated brain natriuretic peptide (BNP) level    Environmental allergies    GAD (generalized anxiety disorder)    GERD (gastroesophageal reflux disease)    History of falling    multiple times   History of Graves' disease    1987 s/p total thyroidectomy   History of kidney stones    History of scabies    07/ 2014  resolved   History of syncope    per D/C note in epic 02-21-2013 ?vasovagal   History of vertebral compression fracture    05/ 2014 --  L1, no surgical intervention   HLD (hyperlipidemia)    Hyperlipidemia    Hypertension    Hypogonadism in male    IDA (iron deficiency anemia)    Lumbago    OA (osteoarthritis)    OSA on CPAP    cpap machine-settings 17   Osteoporosis    Severe   Persistent atrial fibrillation [facility])    cardiologist--- dr Alfonso Patten. [place]   Plaque psoriasis    dermatologist--- dr Harriett Sine--- currently taking otezla   Post-surgical hypothyroidism     followed by pcp---  s/p total thyroidectomy for graves disease in 1987   Renal calculus, right    Thrombocytopenia ([place])    Unsteady gait    uses walker   Wears hearing aid in both ears     SURGICAL HISTORY: Past Surgical History:  Procedure Laterality Date   APPENDECTOMY  2011 approx.    CARDIAC CATHETERIZATION  12-31-2011   dr j. Beatrix Fetters @[facility]    normal LVF w/ multivessel CAD,  occluded RCA w/ colleterals   CATARACT EXTRACTION W/ INTRAOCULAR LENS  IMPLANT, BILATERAL  2016 approx.   COLONOSCOPY     CYSTOSCOPY WITH RETROGRADE PYELOGRAM, URETEROSCOPY AND STENT PLACEMENT Left 11/07/2017   Procedure: CYSTOSCOPY WITH RETROGRADE PYELOGRAM, URETEROSCOPY AND STENT PLACEMENT;  Surgeon: Cleon Gustin, MD;  Location: [facility];  Service: Urology;  Laterality: Left;   CYSTOSCOPY WITH RETROGRADE PYELOGRAM, URETEROSCOPY AND STENT PLACEMENT Right 10/22/2019   Procedure: CYSTOSCOPY WITH RETROGRADE  PYELOGRAM, URETEROSCOPY AND STENT PLACEMENT;  Surgeon: Cleon Gustin, MD;  Location: [facility];  Service: Urology;  Laterality: Right;   CYSTOSCOPY/URETEROSCOPY/HOLMIUM LASER/STENT PLACEMENT Right 03/11/2017   Procedure: CYSTOSCOPY/URETEROSCOPYSTENT PLACEMENT right ureter retrograde pylegram;  Surgeon: Cleon Gustin, MD;  Location: [facility];  Service: Urology;  Laterality: Right;   HARDWARE REMOVAL  10/05/2012   Procedure: HARDWARE REMOVAL;  Surgeon: [name], MD;  [facility];  Service: Orthopedics;  Laterality: Right;  REMOVING  STRYKER  GAMMA NAIL   HARDWARE REMOVAL Right 07/03/2013   Procedure: HARDWARE REMOVAL RIGHT TIBIA ;  Surgeon: Rozanna Box, MD;  Location: [place];  Service: Orthopedics;  Laterality: Right;   HIP CLOSED REDUCTION Right 10/15/2013   Procedure: CLOSED MANIPULATION HIP;  Surgeon: [name], MD;  [facility];  Service: Orthopedics;  Laterality: Right;   HOLMIUM LASER APPLICATION Right 12/11/6710   Procedure: HOLMIUM LASER  APPLICATION;  Surgeon: Cleon Gustin, MD;  [facility];  Service: Urology;  Laterality: Right;   HOLMIUM LASER APPLICATION Left 4/58/0998   Procedure: HOLMIUM LASER APPLICATION;  Surgeon: Cleon Gustin, MD;  [facility];  Service: Urology;  Laterality: Left;   HOLMIUM LASER APPLICATION Left 12/11/8248   Procedure: HOLMIUM LASER APPLICATION;  Surgeon: Cleon Gustin, MD;  [facility];  Service: Urology;  Laterality: Left;   HOLMIUM LASER APPLICATION Right 5/39/7673   Procedure: HOLMIUM LASER APPLICATION;  Surgeon: Cleon Gustin, MD;  Location: [facility];  Service: Urology;  Laterality: Right;   INCISION AND DRAINAGE HIP Right 11/16/2013   Procedure: IRRIGATION AND DEBRIDEMENT RIGHT HIP;  Surgeon: [name], MD;  [facility];  Service: Orthopedics;  Laterality: Right;   INTRAVASCULAR PRESSURE WIRE/FFR STUDY N/A 08/13/2019   Procedure: INTRAVASCULAR PRESSURE WIRE/FFR STUDY;  Surgeon: Nelva Bush, MD;  Location: [place] [facility];  Service: Cardiovascular;  Laterality: N/A;   IR IMAGING GUIDED PORT INSERTION  06/22/2018   IR NEPHROSTOMY PLACEMENT LEFT  03/28/2019   IR URETERAL STENT LEFT NEW ACCESS W/O SEP NEPHROSTOMY CATH  10/24/2017   IR URETERAL STENT LEFT NEW ACCESS W/O SEP NEPHROSTOMY CATH  03/26/2019   NEPHROLITHOTOMY Right 02/08/2017   Procedure: NEPHROLITHOTOMY PERCUTANEOUS WITH SURGEON ACCESS;  Surgeon: Cleon Gustin, MD;  [facility];  Service: Urology;  Laterality: Right;   NEPHROLITHOTOMY Left 10/24/2017   Procedure: NEPHROLITHOTOMY PERCUTANEOUS;  Surgeon: Cleon Gustin, MD;  [facility];  Service: Urology;  Laterality: Left;   NEPHROLITHOTOMY Left 03/26/2019   Procedure: NEPHROLITHOTOMY PERCUTANEOUS;  Surgeon: Cleon Gustin, MD;  [facility];  Service: Urology;  Laterality: Left;  2 HRS   NEPHROLITHOTOMY Left 04/17/2019   Procedure: NEPHROLITHOTOMY PERCUTANEOUS;  Surgeon: Cleon Gustin, MD;  [facility];  Service: Urology;  Laterality: Left;  2 HRS   ORIF TIBIA FRACTURE Right 02/06/2013   Procedure: OPEN REDUCTION INTERNAL FIXATION (ORIF) TIBIA FRACTURE WITH IM ROD FIBULA;  Surgeon: Rozanna Box, MD;  Location: [place];  Service: Orthopedics;  Laterality: Right;   ORIF TIBIA FRACTURE Right 07/03/2013   Procedure: RIGHT TIBIA NON UNION REPAIR ;  Surgeon: Rozanna Box, MD;  Location: [place];  Service: Orthopedics;  Laterality: Right;   ORIF WRIST FRACTURE  10/02/2012   Procedure: OPEN REDUCTION INTERNAL FIXATION (ORIF) WRIST FRACTURE;  Surgeon: Roseanne Kaufman, MD;  [facility];  Service: Orthopedics;  Laterality: Right;  WITH   ANTIBIOTIC  CEMENT  ORIF WRIST FRACTURE Left 10/28/2013   Procedure: OPEN REDUCTION INTERNAL FIXATION (ORIF) WRIST FRACTURE with allograft;  Surgeon: Roseanne Kaufman, MD;  [facility];  Service: Orthopedics;  Laterality: Left;  DVR Plate   QUADRICEPS TENDON REPAIR Left 07/15/2017   Procedure: REPAIR QUADRICEP TENDON;  Surgeon: Frederik Pear, MD;  Location: [place];  Service: Orthopedics;  Laterality: Left;   RIGHT/LEFT HEART CATH AND CORONARY ANGIOGRAPHY N/A 08/13/2019   Procedure: RIGHT/LEFT HEART CATH AND CORONARY ANGIOGRAPHY;  Surgeon: Nelva Bush, MD;  Location: [place] [facility];  Service: Cardiovascular;  Laterality: N/A;   THYROIDECTOMY  02/1986   TOTAL HIP ARTHROPLASTY Right 03-16-2016   @[facility]    TOTAL HIP REVISION  10/05/2012   Procedure: TOTAL HIP REVISION;  Surgeon: [name], MD;  [facility];  Service: Orthopedics;  Laterality: Right;  RIGHT TOTAL HIP REVISION   TOTAL HIP REVISION Right 09/17/2013   Procedure: REVISION RIGHT TOTAL HIP ARTHROPLASTY ;  Surgeon: [name], MD;  [facility];  Service: Orthopedics;  Laterality: Right;   TOTAL HIP REVISION Right 10/26/2013   Procedure: REVISION RIGHT TOTAL HIP ARTHROPLASTY;  Surgeon: [name], MD;  [facility];  Service: Orthopedics;   Laterality: Right;   TOTAL KNEE ARTHROPLASTY Bilateral right 03-15-2011;  left 06-30-2011   TOTAL KNEE REVISION Left 04/11/2017   Procedure: TOTAL KNEE REVISION PATELLA and TIBIA;  Surgeon: Frederik Pear, MD;  Location: [place];  Service: Orthopedics;  Laterality: Left;    SOCIAL HISTORY: Social History   Socioeconomic History   Marital status: Married    Spouse name: Not on file   Number of children: Not on file   Years of education: Not on file   Highest education level: Not on file  Occupational History   Not on file  Tobacco Use   Smoking status: Former Smoker    Packs/day: 1.00    Years: 50.00    Pack years: 50.00    Types: Cigarettes    Quit date: 01/12/2012    Years since quitting: 8.9   Smokeless tobacco: Never Used  Vaping Use   Vaping Use: Never used  Substance and Sexual Activity   Alcohol use: Not Currently    Comment: occasional-social   Drug use: Never   Sexual activity: Yes  Other Topics Concern   Not on file  Social History Narrative   [place] 2.5 months, went home Feb 21st slipped and fell on back and developed.  Home PT/OT.  Just started outpatient physical therapy.  Friday night, misstepped.     Social Determinants of Health   Financial Resource Strain: Low Risk    Difficulty of Paying Living Expenses: Not hard at all  Food Insecurity: No Food Insecurity   Worried About Charity fundraiser in the Last Year: Never true   [place] in the Last Year: Never true  Transportation Needs: No Transportation Needs   Lack of Transportation (Medical): No   Lack of Transportation (Non-Medical): No  Physical Activity: Sufficiently Active   Days of Exercise per Week: 5 days   Minutes of Exercise per Session: 30 min  Stress: No Stress Concern Present   Feeling of Stress : Not at all  Social Connections: Socially Integrated   Frequency of Communication with Friends and Family: More than three times a week   Frequency of Social Gatherings  with Friends and Family: More than three times a week   Attends Religious Services: 1 to 4 times per  year   Active Member of Clubs or Organizations: No   Attends Music therapist: 1 to 4 times per year   Marital Status: Married  Human resources officer Violence: Not on file    FAMILY HISTORY: Family History  Problem Relation Age of Onset   CAD Father 92   Asthma Father    Alcohol abuse Father    Arthritis Mother    Alcohol abuse Sister     ALLERGIES:  is allergic to short ragweed pollen ext.  MEDICATIONS:  Current Outpatient Medications  Medication Sig Dispense Refill   acetaminophen (TYLENOL) 500 MG tablet Take 1,000 mg by mouth daily as needed for moderate pain.     alfuzosin (UROXATRAL) 10 MG 24 hr tablet Take 10 mg by mouth at bedtime.      amoxicillin (AMOXIL) 500 MG capsule Take 2,000 mg by mouth See admin instructions. Take 4 capsules (2000 mg) by mouth 1 hour prior to dental work     aspirin EC 81 MG tablet Take 81 mg by mouth daily.     BELSOMRA 15 MG TABS TAKE 1 TABLET BY MOUTH  DAILY (Patient taking differently: Take 1 tablet by mouth at bedtime as needed.) 90 tablet 1   buPROPion (WELLBUTRIN XL) 300 MG 24 hr tablet Take 1 tablet (300 mg total) by mouth daily with breakfast. 90 tablet 3   Calcium Citrate-Vitamin D (CALCIUM CITRATE + D PO) Take 1 tablet by mouth 2 (two) times daily.      Cholecalciferol (VITAMIN D-3 PO) Take 2,000 Units by mouth 2 (two) times daily.      clonazePAM (KLONOPIN) 1 MG tablet Take 1 tablet (1 mg total) by mouth every 6 (six) hours as needed. for anxiety 360 tablet 1   CRANBERRY PO Take 2 each by mouth in the morning, at noon, and at bedtime.     denosumab (PROLIA) 60 MG/ML SOSY injection Inject 60 mg as directed every 6 (six) months. Last injection: Feb 2020 Next injection: August 2020     diltiazem (CARDIZEM CD) 240 MG 24 hr capsule Take 1 capsule (240 mg total) by mouth daily. 90 capsule 3   DULoxetine (CYMBALTA)  60 MG capsule Take 1 capsule (60 mg total) by mouth 2 (two) times daily. 180 capsule 3   ezetimibe (ZETIA) 10 MG tablet TAKE 1 TABLET EVERY DAY. 90 tablet 3   ferrous sulfate 325 (65 FE) MG tablet Take 325 mg by mouth 2 (two) times daily with a meal.      hydrochlorothiazide (HYDRODIURIL) 12.5 MG tablet Take 1 tablet (12.5 mg total) by mouth daily. 90 tablet 3   HYDROcodone-acetaminophen (NORCO) 7.5-325 MG tablet Take 1 tablet by mouth every 6 (six) hours as needed for severe pain. 120 tablet 0   ipratropium (ATROVENT) 0.03 % nasal spray as needed.     isosorbide mononitrate (IMDUR) 30 MG 24 hr tablet Take 1 tablet (30 mg total) by mouth daily. 90 tablet 3   levothyroxine (SYNTHROID) 200 MCG tablet TAKE 1 TABLET BY MOUTH  DAILY AT 6 AM 90 tablet 3   loratadine (CLARITIN) 10 MG tablet Take 10 mg by mouth daily.      losartan (COZAAR) 50 MG tablet TAKE 1 TABLET EVERY DAY 90 tablet 2   Magnesium 500 MG CAPS Take 1 capsule (500 mg total) by mouth daily. (Patient taking differently: Take 500 mg by mouth every evening.) 30 capsule 1   metoprolol tartrate (LOPRESSOR) 25 MG tablet Take 1 tablet (25 mg total)  by mouth 2 (two) times daily. 180 tablet 3   mirabegron ER (MYRBETRIQ) 25 MG TB24 tablet Take 25 mg by mouth at bedtime.      Multiple Vitamin (MULTIVITAMIN WITH MINERALS) TABS tablet Take 1 tablet by mouth daily. Men's One-A-Day 50+     nitroGLYCERIN (NITROSTAT) 0.4 MG SL tablet Place 0.4 mg under the tongue every 5 (five) minutes as needed for chest pain. x3 doses as needed for chest pain     omeprazole (PRILOSEC) 40 MG capsule TAKE 1 CAPSULE BY MOUTH  DAILY BEFORE BREAKFAST 90 capsule 3   Potassium Citrate 15 MEQ (1620 MG) TBCR Take 1 tablet by mouth daily.     Probiotic Product (PROBIOTIC PO) Take 1 capsule by mouth daily with breakfast.     Risankizumab-rzaa (SKYRIZI PEN [place]) Inject into the skin.     testosterone enanthate (DELATESTRYL) 200 MG/ML injection INJECT 1ML (200MG )  INTO THE MUSCLE EVERY 14 DAYS. DISCARD VIAL AFTER 28 DAYS. 5 mL 3   tiZANidine (ZANAFLEX) 4 MG tablet TAKE 1 TABLET EVERY 6 HOURS AS NEEDED FOR MUSCLE SPASM(S) 360 tablet 1   vitamin C (ASCORBIC ACID) 500 MG tablet Take 500 mg by mouth daily.     XARELTO 10 MG TABS tablet TAKE 1 TABLET EVERY DAY 90 tablet 1   No current facility-administered medications for this visit.    REVIEW OF SYSTEMS:   10 Point review of Systems was done is negative except as noted above.  PHYSICAL EXAMINATION: SKIN: chronic venous stasis changes in BLE, psoriasis rashes on elbows have improved with Otezla   ECOG FS:3 - Symptomatic, >50% confined to bed  Vitals:   12/08/20 1440  BP: (!) 145/70  Pulse: 81  Resp: (!) 70  Temp: (!) 97 F (36.1 C)  SpO2: 96%   Wt Readings from Last 3 Encounters:  12/08/20 (!) 318 lb 6.4 oz (144.4 kg)  09/30/20 (!) 312 lb 6.4 oz (141.7 kg)  09/30/20 (!) 312 lb 6.4 oz (141.7 kg)    Body mass index is 39.8 kg/m.    Exam was given in a chair.   GENERAL:alert, in no acute distress and comfortable SKIN: no acute rashes, no significant lesions EYES: conjunctiva are pink and non-injected, sclera anicteric OROPHARYNX: MMM, no exudates, no oropharyngeal erythema or ulceration NECK: supple, no JVD LYMPH:  no palpable lymphadenopathy in the cervical, axillary or inguinal regions LUNGS: clear to auscultation b/l with normal respiratory effort HEART: regular rate & rhythm ABDOMEN:  normoactive bowel sounds , non tender, not distended. Extremity: no pedal edema PSYCH: alert & oriented x 3 with fluent speech NEURO: no focal motor/sensory deficits  LABORATORY DATA:  I have reviewed the data as listed  CBC Latest Ref Rng & Units 12/08/2020 09/09/2020 06/05/2020  WBC 4.0 - 10.5 K/uL 7.2 7.3 8.3  Hemoglobin 13.0 - 17.0 g/dL 16.6 16.7 15.5  Hematocrit 39.0 - 52.0 % 51.3 51.3 47.0  Platelets 150 - 400 K/uL 158 131(L) 160    CMP Latest Ref Rng & Units 12/08/2020 09/09/2020  06/26/2020  Glucose 70 - 99 mg/dL 166(H) 128(H) 114(H)  BUN 8 - 23 mg/dL 26(H) 25(H) 30(H)  Creatinine 0.61 - 1.24 mg/dL 1.41(H) 1.25(H) 1.26(H)  Sodium 135 - 145 mmol/L 138 136 137  Potassium 3.5 - 5.1 mmol/L 4.7 4.8 4.6  Chloride 98 - 111 mmol/L 103 100 102  CO2 22 - 32 mmol/L 26 28 29   Calcium 8.9 - 10.3 mg/dL 9.1 10.0 9.1  Total Protein 6.5 - 8.1  g/dL 7.0 6.9 6.3  Total Bilirubin 0.3 - 1.2 mg/dL 0.9 1.1 0.9  Alkaline Phos 38 - 126 U/L 71 54 -  AST 15 - 41 U/L 27 23 26   ALT 0 - 44 U/L 39 31 35   . Lab Results  Component Value Date   LDH 236 (H) 12/08/2020   06/28/2019 ECHOCARDIOGRAM COMPLETE (Accession [id number])   05/29/18 Left tonsil biopsy:   RADIOGRAPHIC STUDIES: I have personally reviewed the radiological images as listed and agreed with the findings in the report. No results found.  ASSESSMENT & PLAN:  75 y.o. male with  1. Diffuse Large B-Cell Lymphoma,- Stage III Left tonsil -05/29/18 Left tonsil biopsy which revealed Diffuse Large B-Cell Lymphoma, which requires treatment  -06/07/18 LDH at 214, will monitor change through treatment.  -Baseline ECHO 06/14/18: EF 50-55% -Standard regimen of R-CHOP with G-CSF support for 6 cycles starting 06/23/18  06/16/18 PET/CT revealed 1. Deauville 5 hypermetabolic lesions in the palatine tonsils, left internal jugular chain, and involving a right inguinal lymph node. Deauville 4 involvement of a left inguinal lymph node and an enlarged subcutaneous lesion favoring lymph node superficial to the lower right sternocleidomastoid muscle in the neck. 2. Other imaging findings of potential clinical significance: Aortic Atherosclerosis. Coronary atherosclerosis. Emphysema. Bilateral nonobstructive nephrolithiasis. Umbilical hernia contains adipose tissues. Old right rib fractures some of which are nonunited. 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   08/22/18 PET/CT revealed Interval response to therapy. Overall Deauville criteria 3. Interval decrease in size and  FDG uptake associated with palatine tonsil lesions. There has been resolution of previous left level-II and bilateral inguinal lymph nodes. 2. Enlarged subcutaneous lesion superficial to the lower right sternocleidomastoid muscle exhibits decreased FDG uptake. Currently Deauville criteria 2. 3. No new or progressive sites of disease identified. 4.  Aortic Atherosclerosis. 5. Multi vessel coronary artery atherosclerotic calcifications. 6. Unchanged vertebra plana deformity. 7. Kidney stones.   S/p 6 cycles of R-CHOP completed on 10/12/18  11/22/18 PET/CT revealed The tonsillar mass has essentially resolved. The large presumed lymph node superficial to the right sternocleidomastoid is stable in size at 2.3 cm in short axis, with maximum SUV of 3.3 (formerly 2.4), representing Deauville 3 activity, previously Deauville 2. 2. Other imaging findings of potential clinical significance: Aortic Atherosclerosis and Emphysema. Coronary atherosclerosis. Bilateral nonobstructive nephrolithiasis. Umbilical hernia contains adipose tissue. Chronic high activity posterior to the right hip implant along a region of bony irregularity and deformity probably reflecting chronic inflammation. L1 vertebra plana.  2.  Patient Active Problem List   Diagnosis Date Noted   Chronic diastolic heart failure ([place]) 06/21/2019   H/O nephrolithotomy with removal of calculi 03/26/2019   Renal calculus 03/26/2019   Generalized weakness 11/08/2018   Well adult exam 10/02/2018   Counseling regarding advance care planning and goals of care 07/09/2018   Diffuse large B-cell lymphoma ([place]) 06/23/2018   Port-A-Cath in place 06/23/2018   Hypogonadism in male 11/22/2017   Nephrolithiasis 10/24/2017   Recurrent dislocation of left patella 07/15/2017   Recurrent subluxation of patella, left 07/08/2017   S/P revision of total knee 04/11/2017   Failed total knee, left, initial encounter ([place]) 04/09/2017   Renal calculus, right  02/08/2017   Protein-calorie malnutrition, severe ([place]) 12/08/2016   Chronic obstructive pulmonary disease ([place]) 12/08/2016   [place]aghorn renal calculus 11/05/2016   Wound drainage right hip 11/15/2013   Expected blood loss anemia 09/18/2013   Hyponatremia 09/18/2013   S/P right TH revision 09/17/2013   Nonunion, fracture, Right tibia  07/03/2013   UTI (urinary tract infection) 07/03/2013   Pathological fracture due to osteoporosis with delayed healing 04/24/2013   Osteoporosis with fracture 04/24/2013   Rash and nonspecific skin eruption 03/08/2013   Syncope 02/21/2013   PAF (paroxysmal atrial fibrillation) ([place]) 02/21/2013   Chronic anticoagulation 02/21/2013   Fall 02/08/2013   Physical deconditioning 02/08/2013   Low back pain radiating to both legs 02/08/2013   Compression fracture of L1 lumbar vertebra ([place]) 02/08/2013   Right tibial fracture 02/05/2013   Obesity, unspecified 02/05/2013   Osteomyelitis of right wrist ([place]) 11/02/2012   Anemia 10/06/2012   HTN (hypertension) 10/03/2012   Psoriasis 10/03/2012   Dyslipidemia 10/03/2012   Vitamin D deficiency 10/03/2012   GERD (gastroesophageal reflux disease) 10/03/2012   Hyperglycemia 10/03/2012   Anxiety 10/03/2012   Depression 10/03/2012   CAD (coronary artery disease) 10/03/2012   DJD (degenerative joint disease) 10/03/2012   Chronic gingivitis 10/03/2012   Low testosterone, possible hypogonadism 10/01/2012   Normocytic anemia 09/30/2012   Right femoral fracture ([place]) 09/29/2012   Right forearm fracture 09/29/2012   Atrial fibrillation ([place]) 09/29/2012   Hypothyroidism 09/29/2012   History of recurrent UTIs 09/29/2012    PLAN:  -Discussed pt labwork today, 12/08/2020; blood counts normal, slightly dehydrated, chemistries stable, LDH stable at borderline elevated. -Advised pt there is no strong inclination to repeat imaging at this time. Will continue to monitor.  -No lab or  clinical evidence of DLBCL recurrence at this time. Will continue watchful observation. -Advised pt we will now move to six month follow ups at this time. -Pt is now over 25 months out from treatment (10/12/2018). -Will see back in 6 months with labs.   FOLLOW UP: RTC with Dr Irene Limbo with labs in 6 months   The total time spent in the appointment was 20 minutes and more than 50% was on counseling and direct patient cares.   All of the patient's questions were answered with apparent satisfaction. The patient knows to call the clinic with any problems, questions or concerns.   Sullivan Lone MD [place] AAHIVMS [facility] Hematology/Oncology Physician [facility]  (Office):       [phone] (Work cell):  [phone] (Fax):           [phone]  12/08/2020 3:13 PM  I, Reinaldo Raddle, am acting as scribe for Dr. Sullivan Lone, MD.    .I have reviewed the above documentation for accuracy and completeness, and I agree with the above. Brunetta Genera MD

## 2020-12-08 ENCOUNTER — Inpatient Hospital Stay: Payer: Medicare PPO | Attending: Hematology

## 2020-12-08 ENCOUNTER — Other Ambulatory Visit: Payer: Self-pay

## 2020-12-08 ENCOUNTER — Inpatient Hospital Stay: Payer: Medicare PPO | Admitting: Hematology

## 2020-12-08 VITALS — BP 145/70 | HR 81 | Temp 97.0°F | Resp 70 | Ht 75.0 in | Wt 318.4 lb

## 2020-12-08 DIAGNOSIS — C8330 Diffuse large B-cell lymphoma, unspecified site: Secondary | ICD-10-CM

## 2020-12-08 DIAGNOSIS — F331 Major depressive disorder, recurrent, moderate: Secondary | ICD-10-CM | POA: Diagnosis not present

## 2020-12-08 DIAGNOSIS — Z8572 Personal history of non-Hodgkin lymphomas: Secondary | ICD-10-CM | POA: Diagnosis not present

## 2020-12-08 DIAGNOSIS — J301 Allergic rhinitis due to pollen: Secondary | ICD-10-CM | POA: Diagnosis not present

## 2020-12-08 DIAGNOSIS — J3089 Other allergic rhinitis: Secondary | ICD-10-CM | POA: Diagnosis not present

## 2020-12-08 DIAGNOSIS — F410 Panic disorder [episodic paroxysmal anxiety] without agoraphobia: Secondary | ICD-10-CM | POA: Diagnosis not present

## 2020-12-08 LAB — CMP (CANCER CENTER ONLY)
ALT: 39 U/L (ref 0–44)
AST: 27 U/L (ref 15–41)
Albumin: 3.5 g/dL (ref 3.5–5.0)
Alkaline Phosphatase: 71 U/L (ref 38–126)
Anion gap: 9 (ref 5–15)
BUN: 26 mg/dL — ABNORMAL HIGH (ref 8–23)
CO2: 26 mmol/L (ref 22–32)
Calcium: 9.1 mg/dL (ref 8.9–10.3)
Chloride: 103 mmol/L (ref 98–111)
Creatinine: 1.41 mg/dL — ABNORMAL HIGH (ref 0.61–1.24)
GFR, Estimated: 52 mL/min — ABNORMAL LOW (ref >60)
Glucose, Bld: 166 mg/dL — ABNORMAL HIGH (ref 70–99)
Potassium: 4.7 mmol/L (ref 3.5–5.1)
Sodium: 138 mmol/L (ref 135–145)
Total Bilirubin: 0.9 mg/dL (ref 0.3–1.2)
Total Protein: 7 g/dL (ref 6.5–8.1)

## 2020-12-08 LAB — CBC WITH DIFFERENTIAL (CANCER CENTER ONLY)
Abs Immature Granulocytes: 0.05 10*3/uL (ref 0.00–0.07)
Basophils Absolute: 0 10*3/uL (ref 0.0–0.1)
Basophils Relative: 0 %
Eosinophils Absolute: 0.2 10*3/uL (ref 0.0–0.5)
Eosinophils Relative: 2 %
HCT: 51.3 % (ref 39.0–52.0)
Hemoglobin: 16.6 g/dL (ref 13.0–17.0)
Immature Granulocytes: 1 %
Lymphocytes Relative: 33 %
Lymphs Abs: 2.4 10*3/uL (ref 0.7–4.0)
MCH: 31 pg (ref 26.0–34.0)
MCHC: 32.4 g/dL (ref 30.0–36.0)
MCV: 95.9 fL (ref 80.0–100.0)
Monocytes Absolute: 0.5 10*3/uL (ref 0.1–1.0)
Monocytes Relative: 7 %
Neutro Abs: 4.1 10*3/uL (ref 1.7–7.7)
Neutrophils Relative %: 57 %
Platelet Count: 158 10*3/uL (ref 150–400)
RBC: 5.35 MIL/uL (ref 4.22–5.81)
RDW: 14.6 % (ref 11.5–15.5)
WBC Count: 7.2 10*3/uL (ref 4.0–10.5)
nRBC: 0 % (ref 0.0–0.2)

## 2020-12-08 LAB — LACTATE DEHYDROGENASE: LDH: 236 U/L — ABNORMAL HIGH (ref 98–192)

## 2020-12-27 ENCOUNTER — Other Ambulatory Visit: Payer: Self-pay | Admitting: Internal Medicine

## 2020-12-29 DIAGNOSIS — F331 Major depressive disorder, recurrent, moderate: Secondary | ICD-10-CM | POA: Diagnosis not present

## 2020-12-29 DIAGNOSIS — F410 Panic disorder [episodic paroxysmal anxiety] without agoraphobia: Secondary | ICD-10-CM | POA: Diagnosis not present

## 2020-12-30 ENCOUNTER — Other Ambulatory Visit: Payer: Self-pay

## 2020-12-31 ENCOUNTER — Other Ambulatory Visit: Payer: Self-pay

## 2020-12-31 ENCOUNTER — Ambulatory Visit: Payer: Medicare PPO | Admitting: Internal Medicine

## 2020-12-31 ENCOUNTER — Encounter: Payer: Self-pay | Admitting: Internal Medicine

## 2020-12-31 DIAGNOSIS — I480 Paroxysmal atrial fibrillation: Secondary | ICD-10-CM

## 2020-12-31 DIAGNOSIS — E785 Hyperlipidemia, unspecified: Secondary | ICD-10-CM | POA: Diagnosis not present

## 2020-12-31 DIAGNOSIS — M1990 Unspecified osteoarthritis, unspecified site: Secondary | ICD-10-CM

## 2020-12-31 DIAGNOSIS — I2510 Atherosclerotic heart disease of native coronary artery without angina pectoris: Secondary | ICD-10-CM | POA: Diagnosis not present

## 2020-12-31 DIAGNOSIS — E559 Vitamin D deficiency, unspecified: Secondary | ICD-10-CM | POA: Diagnosis not present

## 2020-12-31 MED ORDER — TESTOSTERONE ENANTHATE 200 MG/ML IM SOLN: INTRAMUSCULAR | 3 refills | Status: DC

## 2020-12-31 NOTE — Assessment & Plan Note (Signed)
Dr Oval Linsey Cont w/Xarelto

## 2020-12-31 NOTE — Assessment & Plan Note (Signed)
Cont w/Lipitor 

## 2020-12-31 NOTE — Progress Notes (Signed)
Subjective:  Patient ID: [name], male    DOB: March 12, 1946  Age: 75 y.o. MRN: [id number]  CC: Follow-up (3 month f/u- Req refill on Testosterone need to go to [facility])   HPI [name] presents for OAB, LBP, OA  Outpatient Medications Prior to Visit  Medication Sig Dispense Refill  . acetaminophen (TYLENOL) 500 MG tablet Take 1,000 mg by mouth daily as needed for moderate pain.    [name] alfuzosin (UROXATRAL) 10 MG 24 hr tablet Take 10 mg by mouth at bedtime.     [name] amoxicillin (AMOXIL) 500 MG capsule Take 2,000 mg by mouth See admin instructions. Take 4 capsules (2000 mg) by mouth 1 hour prior to dental work    . aspirin EC 81 MG tablet Take 81 mg by mouth daily.    . BELSOMRA 15 MG TABS TAKE 1 TABLET BY MOUTH  DAILY (Patient taking differently: Take 1 tablet by mouth at bedtime as needed.) 90 tablet 1  . buPROPion (WELLBUTRIN XL) 300 MG 24 hr tablet Take 1 tablet (300 mg total) by mouth daily with breakfast. 90 tablet 3  . Calcium Citrate-Vitamin D (CALCIUM CITRATE + D PO) Take 1 tablet by mouth 2 (two) times daily.     . Cholecalciferol (VITAMIN D-3 PO) Take 2,000 Units by mouth 2 (two) times daily.     . clonazePAM (KLONOPIN) 1 MG tablet Take 1 tablet (1 mg total) by mouth every 6 (six) hours as needed. for anxiety 360 tablet 1  . denosumab (PROLIA) 60 MG/ML SOSY injection Inject 60 mg as directed every 6 (six) months. Last injection: Feb 2020 Next injection: August 2020    . diltiazem (CARDIZEM CD) 240 MG 24 hr capsule Take 1 capsule (240 mg total) by mouth daily. 90 capsule 3  . DULoxetine (CYMBALTA) 60 MG capsule Take 1 capsule (60 mg total) by mouth 2 (two) times daily. 180 capsule 3  . ezetimibe (ZETIA) 10 MG tablet TAKE 1 TABLET EVERY DAY. 90 tablet 3  . ferrous sulfate 325 (65 FE) MG tablet Take 325 mg by mouth 2 (two) times daily with a meal.     . hydrochlorothiazide (HYDRODIURIL) 12.5 MG tablet Take 1 tablet (12.5 mg total) by mouth daily. 90 tablet 3  .  HYDROcodone-acetaminophen (NORCO) 7.5-325 MG tablet Take 1 tablet by mouth every 6 (six) hours as needed for severe pain. 120 tablet 0  . ipratropium (ATROVENT) 0.03 % nasal spray as needed.    . isosorbide mononitrate (IMDUR) 30 MG 24 hr tablet Take 1 tablet (30 mg total) by mouth daily. 90 tablet 3  . levothyroxine (SYNTHROID) 200 MCG tablet TAKE 1 TABLET BY MOUTH&nbsp;&nbsp;DAILY AT 6 AM 90 tablet 3  . loratadine (CLARITIN) 10 MG tablet Take 10 mg by mouth daily.     [name] losartan (COZAAR) 50 MG tablet TAKE 1 TABLET EVERY DAY 90 tablet 2  . Magnesium 500 MG CAPS Take 1 capsule (500 mg total) by mouth daily. (Patient taking differently: Take 500 mg by mouth every evening.) 30 capsule 1  . metoprolol tartrate (LOPRESSOR) 25 MG tablet Take 1 tablet (25 mg total) by mouth 2 (two) times daily. 180 tablet 3  . mirabegron ER (MYRBETRIQ) 25 MG TB24 tablet Take 25 mg by mouth at bedtime.     . Multiple Vitamin (MULTIVITAMIN WITH MINERALS) TABS tablet Take 1 tablet by mouth daily. Men's One-A-Day 32+    . nitroGLYCERIN (NITROSTAT) 0.4 MG SL tablet Place 0.4 mg under the  tongue every 5 (five) minutes as needed for chest pain. x3 doses as needed for chest pain    . omeprazole (PRILOSEC) 40 MG capsule TAKE 1 CAPSULE EVERY DAY BEFORE BREAKFAST 90 capsule 2  . Potassium Citrate 15 MEQ (1620 MG) TBCR Take 1 tablet by mouth daily.    . Probiotic Product (PROBIOTIC PO) Take 1 capsule by mouth daily with breakfast.    . Risankizumab-rzaa (SKYRIZI PEN [place]) Inject into the skin.    [name] testosterone enanthate (DELATESTRYL) 200 MG/ML injection INJECT 1ML (200MG ) INTO THE MUSCLE EVERY 14 DAYS. DISCARD VIAL AFTER 28 DAYS. 5 mL 3  . tiZANidine (ZANAFLEX) 4 MG tablet TAKE 1 TABLET EVERY 6 HOURS AS NEEDED FOR MUSCLE SPASM(S) 360 tablet 1  . vitamin C (ASCORBIC ACID) 500 MG tablet Take 500 mg by mouth daily.    Alveda Reasons 10 MG TABS tablet TAKE 1 TABLET EVERY DAY 90 tablet 1  . CRANBERRY PO Take 2 each by mouth in the morning, at  noon, and at bedtime.     No facility-administered medications prior to visit.    ROS: Review of Systems  Constitutional: Negative for appetite change, fatigue and unexpected weight change.  HENT: Negative for congestion, nosebleeds, sneezing, sore throat and trouble swallowing.   Eyes: Negative for itching and visual disturbance.  Respiratory: Negative for cough.   Cardiovascular: Negative for chest pain, palpitations and leg swelling.  Gastrointestinal: Negative for abdominal distention, blood in stool, diarrhea and nausea.  Genitourinary: Positive for frequency and urgency. Negative for hematuria.  Musculoskeletal: Positive for arthralgias, back pain and gait problem. Negative for joint swelling and neck pain.  Skin: Negative for rash.  Neurological: Negative for dizziness, tremors, speech difficulty and weakness.  Psychiatric/Behavioral: Negative for agitation, dysphoric mood, sleep disturbance and suicidal ideas. The patient is not nervous/anxious.     Objective:  BP (!) 158/72 (BP Location: Left Arm)   Pulse 76   Temp 98 F (36.7 C) (Oral)   Ht 6\' 3"  (1.905 m)   Wt (!) 317 lb 9.6 oz (144.1 kg)   SpO2 95%   BMI 39.70 kg/m   BP Readings from Last 3 Encounters:  12/31/20 (!) 158/72  12/08/20 (!) 145/70  09/30/20 120/60    Wt Readings from Last 3 Encounters:  12/31/20 (!) 317 lb 9.6 oz (144.1 kg)  12/08/20 (!) 318 lb 6.4 oz (144.4 kg)  09/30/20 (!) 312 lb 6.4 oz (141.7 kg)    Physical Exam Constitutional:      Appearance: He is obese.  Musculoskeletal:        General: Tenderness present.  Neurological:     Mental Status: He is oriented to person, place, and time.     Motor: Weakness present.     Coordination: Coordination abnormal.     Gait: Gait abnormal.    Using a walker  Lab Results  Component Value Date   WBC 7.2 12/08/2020   HGB 16.6 12/08/2020   HCT 51.3 12/08/2020   PLT 158 12/08/2020   GLUCOSE 166 (H) 12/08/2020   CHOL 115 10/17/2019   TRIG  47 10/17/2019   HDL 55 10/17/2019   LDLCALC 49 10/17/2019   ALT 39 12/08/2020   AST 27 12/08/2020   NA 138 12/08/2020   K 4.7 12/08/2020   CL 103 12/08/2020   CREATININE 1.41 (H) 12/08/2020   BUN 26 (H) 12/08/2020   CO2 26 12/08/2020   TSH 0.02 Repeated and verified X2. (L) 07/08/2020   PSA 1.08 06/26/2020  INR 1.0 03/26/2019   HGBA1C 5.8 (H) 02/06/2013    No results found.  Assessment & Plan:    Walker Kehr, MD

## 2020-12-31 NOTE — Assessment & Plan Note (Signed)
On Vit D 

## 2020-12-31 NOTE — Assessment & Plan Note (Signed)
Cont w/Xarelto °

## 2020-12-31 NOTE — Assessment & Plan Note (Signed)
F/u w/Dr Oval Linsey Cont w/Xarelto, Zetia, Imdur

## 2020-12-31 NOTE — Assessment & Plan Note (Signed)
Norco  Potential benefits of a long term opioids use as well as potential risks (i.e. addiction risk, apnea etc) and complications (i.e. Somnolence, constipation and others) were explained to the patient and were aknowledged. 

## 2021-01-01 DIAGNOSIS — J301 Allergic rhinitis due to pollen: Secondary | ICD-10-CM | POA: Diagnosis not present

## 2021-01-01 DIAGNOSIS — J3089 Other allergic rhinitis: Secondary | ICD-10-CM | POA: Diagnosis not present

## 2021-01-10 ENCOUNTER — Other Ambulatory Visit: Payer: Self-pay | Admitting: Urology

## 2021-01-15 DIAGNOSIS — F331 Major depressive disorder, recurrent, moderate: Secondary | ICD-10-CM | POA: Diagnosis not present

## 2021-01-15 DIAGNOSIS — F410 Panic disorder [episodic paroxysmal anxiety] without agoraphobia: Secondary | ICD-10-CM | POA: Diagnosis not present

## 2021-02-02 DIAGNOSIS — F410 Panic disorder [episodic paroxysmal anxiety] without agoraphobia: Secondary | ICD-10-CM | POA: Diagnosis not present

## 2021-02-02 DIAGNOSIS — F331 Major depressive disorder, recurrent, moderate: Secondary | ICD-10-CM | POA: Diagnosis not present

## 2021-02-05 DIAGNOSIS — J301 Allergic rhinitis due to pollen: Secondary | ICD-10-CM | POA: Diagnosis not present

## 2021-02-05 DIAGNOSIS — J3089 Other allergic rhinitis: Secondary | ICD-10-CM | POA: Diagnosis not present

## 2021-02-10 ENCOUNTER — Encounter: Payer: Self-pay | Admitting: Cardiovascular Disease

## 2021-02-10 ENCOUNTER — Ambulatory Visit: Payer: Medicare PPO | Admitting: Cardiovascular Disease

## 2021-02-10 ENCOUNTER — Other Ambulatory Visit: Payer: Self-pay

## 2021-02-10 VITALS — BP 134/60 | HR 83 | Ht 75.0 in | Wt 313.0 lb

## 2021-02-10 DIAGNOSIS — I4811 Longstanding persistent atrial fibrillation: Secondary | ICD-10-CM

## 2021-02-10 DIAGNOSIS — I5032 Chronic diastolic (congestive) heart failure: Secondary | ICD-10-CM | POA: Diagnosis not present

## 2021-02-10 DIAGNOSIS — I10 Essential (primary) hypertension: Secondary | ICD-10-CM

## 2021-02-10 DIAGNOSIS — E7800 Pure hypercholesterolemia, unspecified: Secondary | ICD-10-CM | POA: Diagnosis not present

## 2021-02-10 DIAGNOSIS — I2510 Atherosclerotic heart disease of native coronary artery without angina pectoris: Secondary | ICD-10-CM | POA: Diagnosis not present

## 2021-02-10 HISTORY — DX: Pure hypercholesterolemia, unspecified: E78.00

## 2021-02-10 NOTE — Assessment & Plan Note (Signed)
Moderate to severe obstructive CAD.  He has exertional dyspnea but no chest pain.  This is likely multifactorial as he has gained some weight and is no longer exercising.  Given his body habitus the best way to truly rule out further obstruction would be cardiac catheterization.  He is going to work on trying to increase his exercise and see if this helps with his symptoms.  If not, we will need to repeat his cath.  Continue aspirin, Imdur, metoprolol, Zetia.

## 2021-02-10 NOTE — Patient Instructions (Signed)
Medication Instructions:  Your physician recommends that you continue on your current medications as directed. Please refer to the Current Medication list given to you today.   *If you need a refill on your cardiac medications before your next appointment, please call your pharmacy*  Lab Work: NONE   Testing/Procedures: NONE  Follow-Up: At [facility], you and your health needs are our priority.  As part of our continuing mission to provide you with exceptional heart care, we have created designated Provider Care Teams.  These Care Teams include your primary Cardiologist (physician) and Advanced Practice Providers (APPs -  Physician Assistants and Nurse Practitioners) who all work together to provide you with the care you need, when you need it.  We recommend signing up for the patient portal called "MyChart".  Sign up information is provided on this After Visit Summary.  MyChart is used to connect with patients for Virtual Visits (Telemedicine).  Patients are able to view lab/test results, encounter notes, upcoming appointments, etc.  Non-urgent messages can be sent to your provider as well.   To learn more about what you can do with MyChart, go to [email or web address].    Your next appointment:   6-8 WEEKS  The format for your next appointment:   In Person  Provider:   You may see Skeet Latch, MD   or one of the following Advanced Practice Providers on your designated Care Team:    Sande Rives, PA-C  Coletta Memos, FNP  SOMEONE FROM THE PREP TEAM WILL BE IN [facility] SOON   Other Instructions  INCREASE YOUR EXERCISE

## 2021-02-10 NOTE — Progress Notes (Signed)
Cardiology Office Note  Date:  02/10/2021   ID:  Joseph, Lane 1946-09-07, MRN [id number]  PCP:  Cassandria Anger, MD  Cardiologist:   Skeet Latch, MD   Chief Complaint  Patient presents with  . Follow-up    6 months.  . Shortness of Breath    History of Present Illness: [name] is a 75 y.o. male with medically managed moderate to severe CAD, long-standing-persistent atrial fibrillation, chronic diastolic heart failure, hypertension, hyperlipidemia, sleep apnea on CPAP, diffuse large B cell lymphoma (remission), hypothyroidism and prior tobacco abuse here for follow-up.  He was initially seen 05/2017 to establish care.  [name] was previously a patient of [name] and last saw him 01/2016.  At that time he was preparing for left hip surgery.  [name] recommended stress testing but he declined.  He underwent surgery with no complications.  He was initially diagnosed with atrial fibrillation around 2012.  He was started on diltiazem and amiodarone.  He was noted to be in persistent atrial fibrillation.  Therefore amiodarone was discontinued. At the time of his atrial fibrillation diagnosis he also underwent a heart catheterization. He reportedly had 90% obstruction of an unknown vessel that had developed collaterals. He was started on Ranexa.  He had an echo 11/03/16 revealed LVEF of 60-65%. Severe left and right atrial enlargement was noted and the IVC was dilated.  He had no angina and so Ranexa was discontinued.  Consideration was made for starting a beta-blocker instead of a calcium channel blocker.  However we decided not to make too many changes at one time.  [name] was diagnosed with diffuse large B cell lymphoma on 05/2018.  He completed 6 cycles of chemotherapy with rituximab, cyclophosphamide, doxorubicin, vincristine, and prednisone and is now in surveillance.  He had a baseline echo 06/2018 that revealed LVEF 50 to 55% and normal diastolic  function.  He had apical hypokinesis and GLS was -13.9. Heart rate was poorly controlled so metoprolol was added to his regimen.   He was seen in he ED and BNP was mildly elevated to 283.  He was started on lasix 20mg  daily and has noticed an improvement in his breathing.  He was referred for an echo 06/28/19 that revealed LVEF 50-55%.  GLS was -10.1%. The RV had mildly reduced systolic function.  He noted frequent urination and his blood pressure was poorly controlled so Lasix was switched to hydrochlorothiazide.  There was concern that his symptoms could be due to ischemia.  He underwent left heart catheterization 08/2019.  He was found to have significant two-vessel CAD including sequential 60 and 70% mid LAD lesions and chronic total occlusion of the mid RCA.  There were bridging left to right collaterals.  Right atrial pressure was 13.  Pulmonary capillary wedge pressure was 20 to 25 mmHg.  There was a consideration for two-vessel CABG versus medical management.  He was not deemed to be a good candidate for surgery.  He was started on Imdur.  Since then he has not had any chest pain and his breathing has been stable.  He struggled with 3 kidney stones and frequent urination. HCTZ was reduced and Losartan was increased.  Today, he is feeling ok overall. He notes he is gaining more weight recently and knows he needs to improve his diet and exercise. Pre-COVID he would walk in the pool and do water aerobics at the [facility], but has not been able to since. He  may go to the [facility] soon. Lately, it is easier for him to get short of breath, but he does not have any chest pain or pressure.  He has to huff and puff with minimal activity.  He denies any LE edema, orthopnea or PND. He continues to work with his urologist for his frequent urination, and has noticed some improvement.  Since taking furosemide he has no more lower extremity edema and is pleased that he has skinny ankles.   Past Medical History:   Diagnosis Date  . Anticoagulant long-term use    xarelto--- takes for AFib,  managed by pcp, Dr Alain Marion  . Benign localized prostatic hyperplasia with lower urinary tract symptoms (LUTS)   . CAD (coronary artery disease) cardiologist--  dr t. Oval Linsey   hx of known CAD obstruction w/ collaterals (cath done @ [facility] 12-31-2011) ;   last cardiac cath 08-13-2019  showed sig. 2V CAD involving proxLAD and CTO of the RCA/  chronic total occlusion midRCA w/ bridging and L>R collaterals  . Diastolic CHF ([place])    dx [id number] hospital admission (followed by pcp)  . Diffuse large B cell lymphoma [facility]) oncologist-- dr Irene Limbo--- in remission   dx 08/ 2019 -- bx left tonsill mass-- involving lymph nodes--- completed chemotherapy 10-12-2018  . DJD (degenerative joint disease)   . Dysthymic disorder   . Elevated brain natriuretic peptide (BNP) level   . Environmental allergies   . GAD (generalized anxiety disorder)   . GERD (gastroesophageal reflux disease)   . History of falling    multiple times  . History of Graves' disease    1987 s/p total thyroidectomy  . History of kidney stones   . History of scabies    07/ 2014  resolved  . History of syncope    per D/C note in epic 02-21-2013 ?vasovagal  . History of vertebral compression fracture    05/ 2014 --  L1, no surgical intervention  . HLD (hyperlipidemia)   . Hyperlipidemia   . Hypertension   . Hypogonadism in male   . IDA (iron deficiency anemia)   . Lumbago   . OA (osteoarthritis)   . OSA on CPAP    cpap machine-settings 17  . Osteoporosis    Severe  . Persistent atrial fibrillation [facility])    cardiologist--- dr r. Oval Linsey  . Plaque psoriasis    dermatologist--- dr Harriett Sine--- currently taking otezla  . Post-surgical hypothyroidism    followed by pcp---  s/p total thyroidectomy for graves disease in 1987  . Pure hypercholesterolemia 02/10/2021  . Renal calculus, right   . Thrombocytopenia ([place])   . Unsteady gait    uses  walker  . Wears hearing aid in both ears     Past Surgical History:  Procedure Laterality Date  . APPENDECTOMY  2011 approx.   [name] CARDIAC CATHETERIZATION  12-31-2011   dr Lenna Sciara. Beatrix Fetters @[facility]    normal LVF w/ multivessel CAD,  occluded RCA w/ colleterals  . CATARACT EXTRACTION W/ INTRAOCULAR LENS  IMPLANT, BILATERAL  2016 approx.  . COLONOSCOPY    . CYSTOSCOPY WITH RETROGRADE PYELOGRAM, URETEROSCOPY AND STENT PLACEMENT Left 11/07/2017   Procedure: CYSTOSCOPY WITH RETROGRADE PYELOGRAM, URETEROSCOPY AND STENT PLACEMENT;  Surgeon: Cleon Gustin, MD;  [facility];  Service: Urology;  Laterality: Left;  . CYSTOSCOPY WITH RETROGRADE PYELOGRAM, URETEROSCOPY AND STENT PLACEMENT Right 10/22/2019   Procedure: CYSTOSCOPY WITH RETROGRADE PYELOGRAM, URETEROSCOPY AND STENT PLACEMENT;  Surgeon: Cleon Gustin, MD;  Location: [facility];  Service: Urology;  Laterality: Right;  . CYSTOSCOPY/URETEROSCOPY/HOLMIUM LASER/STENT PLACEMENT Right 03/11/2017   Procedure: CYSTOSCOPY/URETEROSCOPYSTENT PLACEMENT right ureter retrograde pylegram;  Surgeon: Cleon Gustin, MD;  [facility];  Service: Urology;  Laterality: Right;  . HARDWARE REMOVAL  10/05/2012   Procedure: HARDWARE REMOVAL;  Surgeon: [name], MD;  [facility];  Service: Orthopedics;  Laterality: Right;  REMOVING  STRYKER  GAMMA NAIL  . HARDWARE REMOVAL Right 07/03/2013   Procedure: HARDWARE REMOVAL RIGHT TIBIA ;  Surgeon: Rozanna Box, MD;  Location: [place];  Service: Orthopedics;  Laterality: Right;  . HIP CLOSED REDUCTION Right 10/15/2013   Procedure: CLOSED MANIPULATION HIP;  Surgeon: [name], MD;  [facility];  Service: Orthopedics;  Laterality: Right;  . HOLMIUM LASER APPLICATION Right [id number]   Procedure: HOLMIUM LASER APPLICATION;  Surgeon: Cleon Gustin, MD;  [facility];  Service: Urology;  Laterality: Right;  . HOLMIUM LASER APPLICATION Left [id number]   Procedure: HOLMIUM LASER  APPLICATION;  Surgeon: Cleon Gustin, MD;  [facility];  Service: Urology;  Laterality: Left;  . HOLMIUM LASER APPLICATION Left [id number]   Procedure: HOLMIUM LASER APPLICATION;  Surgeon: Cleon Gustin, MD;  [facility];  Service: Urology;  Laterality: Left;  . HOLMIUM LASER APPLICATION Right [id number]   Procedure: HOLMIUM LASER APPLICATION;  Surgeon: Cleon Gustin, MD;  Location[facility];  Service: Urology;  Laterality: Right;  . INCISION AND DRAINAGE HIP Right 11/16/2013   Procedure: IRRIGATION AND DEBRIDEMENT RIGHT HIP;  Surgeon: [name], MD;  [facility];  Service: Orthopedics;  Laterality: Right;  . INTRAVASCULAR PRESSURE WIRE/FFR STUDY N/A 08/13/2019   Procedure: INTRAVASCULAR PRESSURE WIRE/FFR STUDY;  Surgeon: Nelva Bush, MD;  Location: [place] [facility];  Service: Cardiovascular;  Laterality: N/A;  . IR IMAGING GUIDED PORT INSERTION  06/22/2018  . IR NEPHROSTOMY PLACEMENT LEFT  03/28/2019  . IR URETERAL STENT LEFT NEW ACCESS W/O SEP NEPHROSTOMY CATH  10/24/2017  . IR URETERAL STENT LEFT NEW ACCESS W/O SEP NEPHROSTOMY CATH  03/26/2019  . NEPHROLITHOTOMY Right 02/08/2017   Procedure: NEPHROLITHOTOMY PERCUTANEOUS WITH SURGEON ACCESS;  Surgeon: Cleon Gustin, MD;  [facility];  Service: Urology;  Laterality: Right;  . NEPHROLITHOTOMY Left 10/24/2017   Procedure: NEPHROLITHOTOMY PERCUTANEOUS;  Surgeon: Cleon Gustin, MD;  [facility];  Service: Urology;  Laterality: Left;  . NEPHROLITHOTOMY Left 03/26/2019   Procedure: NEPHROLITHOTOMY PERCUTANEOUS;  Surgeon: Cleon Gustin, MD;  [facility];  Service: Urology;  Laterality: Left;  2 HRS  . NEPHROLITHOTOMY Left 04/17/2019   Procedure: NEPHROLITHOTOMY PERCUTANEOUS;  Surgeon: Cleon Gustin, MD;  [facility];  Service: Urology;  Laterality: Left;  2 HRS  . ORIF TIBIA FRACTURE Right 02/06/2013   Procedure: OPEN REDUCTION INTERNAL FIXATION (ORIF) TIBIA  FRACTURE WITH IM ROD FIBULA;  Surgeon: Rozanna Box, MD;  Location: [place];  Service: Orthopedics;  Laterality: Right;  . ORIF TIBIA FRACTURE Right 07/03/2013   Procedure: RIGHT TIBIA NON UNION REPAIR ;  Surgeon: Rozanna Box, MD;  Location: [place];  Service: Orthopedics;  Laterality: Right;  . ORIF WRIST FRACTURE  10/02/2012   Procedure: OPEN REDUCTION INTERNAL FIXATION (ORIF) WRIST FRACTURE;  Surgeon: Roseanne Kaufman, MD;  [facility];  Service: Orthopedics;  Laterality: Right;  WITH   ANTIBIOTIC  CEMENT  . ORIF WRIST FRACTURE Left 10/28/2013   Procedure: OPEN REDUCTION INTERNAL FIXATION (ORIF) WRIST FRACTURE  with allograft;  Surgeon: Roseanne Kaufman, MD;  [facility];  Service: Orthopedics;  Laterality: Left;  DVR Plate  . QUADRICEPS TENDON REPAIR Left 07/15/2017   Procedure: REPAIR QUADRICEP TENDON;  Surgeon: Frederik Pear, MD;  Location: [place];  Service: Orthopedics;  Laterality: Left;  . RIGHT/LEFT HEART CATH AND CORONARY ANGIOGRAPHY N/A 08/13/2019   Procedure: RIGHT/LEFT HEART CATH AND CORONARY ANGIOGRAPHY;  Surgeon: Nelva Bush, MD;  Location: [place] [facility];  Service: Cardiovascular;  Laterality: N/A;  . THYROIDECTOMY  02/1986  . TOTAL HIP ARTHROPLASTY Right 03-16-2016   @[facility]   . TOTAL HIP REVISION  10/05/2012   Procedure: TOTAL HIP REVISION;  Surgeon: [name], MD;  [facility];  Service: Orthopedics;  Laterality: Right;  RIGHT TOTAL HIP REVISION  . TOTAL HIP REVISION Right 09/17/2013   Procedure: REVISION RIGHT TOTAL HIP ARTHROPLASTY ;  Surgeon: [name], MD;  [facility];  Service: Orthopedics;  Laterality: Right;  . TOTAL HIP REVISION Right 10/26/2013   Procedure: REVISION RIGHT TOTAL HIP ARTHROPLASTY;  Surgeon: [name], MD;  [facility];  Service: Orthopedics;  Laterality: Right;  . TOTAL KNEE ARTHROPLASTY Bilateral right 03-15-2011;  left 06-30-2011  . TOTAL KNEE REVISION Left 04/11/2017   Procedure: TOTAL KNEE REVISION PATELLA and  TIBIA;  Surgeon: Frederik Pear, MD;  Location: [place];  Service: Orthopedics;  Laterality: Left;     Current Outpatient Medications  Medication Sig Dispense Refill  . acetaminophen (TYLENOL) 500 MG tablet Take 1,000 mg by mouth daily as needed for moderate pain.    [name] alfuzosin (UROXATRAL) 10 MG 24 hr tablet TAKE 1 TABLET AT BEDTIME 90 tablet 3  . amoxicillin (AMOXIL) 500 MG capsule Take 2,000 mg by mouth See admin instructions. Take 4 capsules (2000 mg) by mouth 1 hour prior to dental work    . aspirin EC 81 MG tablet Take 81 mg by mouth daily.    . BELSOMRA 15 MG TABS TAKE 1 TABLET BY MOUTH  DAILY (Patient taking differently: Take 1 tablet by mouth at bedtime as needed.) 90 tablet 1  . buPROPion (WELLBUTRIN XL) 300 MG 24 hr tablet Take 1 tablet (300 mg total) by mouth daily with breakfast. 90 tablet 3  . Calcium Citrate-Vitamin D (CALCIUM CITRATE + D PO) Take 1 tablet by mouth 2 (two) times daily.     . Cholecalciferol (VITAMIN D-3 PO) Take 2,000 Units by mouth 2 (two) times daily.     . clonazePAM (KLONOPIN) 1 MG tablet Take 1 tablet (1 mg total) by mouth every 6 (six) hours as needed. for anxiety 360 tablet 1  . denosumab (PROLIA) 60 MG/ML SOSY injection Inject 60 mg as directed every 6 (six) months. Last injection: Feb 2020 Next injection: August 2020    . diltiazem (CARDIZEM CD) 240 MG 24 hr capsule Take 1 capsule (240 mg total) by mouth daily. 90 capsule 3  . DULoxetine (CYMBALTA) 60 MG capsule Take 1 capsule (60 mg total) by mouth 2 (two) times daily. 180 capsule 3  . ezetimibe (ZETIA) 10 MG tablet TAKE 1 TABLET EVERY DAY. 90 tablet 3  . ferrous sulfate 325 (65 FE) MG tablet Take 325 mg by mouth 2 (two) times daily with a meal.     . hydrochlorothiazide (HYDRODIURIL) 12.5 MG tablet Take 1 tablet (12.5 mg total) by mouth daily. 90 tablet 3  . HYDROcodone-acetaminophen (NORCO) 7.5-325 MG tablet Take 1 tablet by mouth every 6 (six) hours as needed for severe  pain. 120 tablet 0  . ipratropium  (ATROVENT) 0.03 % nasal spray as needed.    . isosorbide mononitrate (IMDUR) 30 MG 24 hr tablet Take 1 tablet (30 mg total) by mouth daily. 90 tablet 3  . levothyroxine (SYNTHROID) 200 MCG tablet TAKE 1 TABLET BY MOUTH  DAILY AT 6 AM 90 tablet 3  . loratadine (CLARITIN) 10 MG tablet Take 10 mg by mouth daily.     [name] losartan (COZAAR) 50 MG tablet TAKE 1 TABLET EVERY DAY 90 tablet 2  . Magnesium 500 MG CAPS Take 1 capsule (500 mg total) by mouth daily. (Patient taking differently: Take 500 mg by mouth every evening.) 30 capsule 1  . metoprolol tartrate (LOPRESSOR) 25 MG tablet Take 1 tablet (25 mg total) by mouth 2 (two) times daily. 180 tablet 3  . mirabegron ER (MYRBETRIQ) 25 MG TB24 tablet Take 25 mg by mouth at bedtime.     . Multiple Vitamin (MULTIVITAMIN WITH MINERALS) TABS tablet Take 1 tablet by mouth daily. Men's One-A-Day 27+    . nitroGLYCERIN (NITROSTAT) 0.4 MG SL tablet Place 0.4 mg under the tongue every 5 (five) minutes as needed for chest pain. x3 doses as needed for chest pain    . omeprazole (PRILOSEC) 40 MG capsule TAKE 1 CAPSULE EVERY DAY BEFORE BREAKFAST 90 capsule 2  . Potassium Citrate 15 MEQ (1620 MG) TBCR Take 1 tablet by mouth daily.    . Probiotic Product (PROBIOTIC PO) Take 1 capsule by mouth daily with breakfast.    . Risankizumab-rzaa (SKYRIZI PEN [place]) Inject into the skin.    [name] testosterone enanthate (DELATESTRYL) 200 MG/ML injection INJECT 1ML (200MG ) INTO THE MUSCLE EVERY 14 DAYS. DISCARD VIAL AFTER 28 DAYS. 5 mL 3  . tiZANidine (ZANAFLEX) 4 MG tablet TAKE 1 TABLET EVERY 6 HOURS AS NEEDED FOR MUSCLE SPASM(S) 360 tablet 1  . vitamin C (ASCORBIC ACID) 500 MG tablet Take 500 mg by mouth daily.    Alveda Reasons 10 MG TABS tablet TAKE 1 TABLET EVERY DAY 90 tablet 1   No current facility-administered medications for this visit.    Allergies:   Short ragweed pollen ext    Social History:  The patient  reports that he quit smoking about 9 years ago. His smoking use included  cigarettes. He has a 50.00 pack-year smoking history. He has never used smokeless tobacco. He reports previous alcohol use. He reports that he does not use drugs.   Family History:  The patient's family history includes Alcohol abuse in his father and sister; Arthritis in his mother; Asthma in his father; CAD (age of onset: 45) in his father.    ROS:   Please see the history of present illness. (+) Shortness of breath (+) Polyuria All other systems are reviewed and negative.    PHYSICAL EXAM: VS:  BP 134/60 (BP Location: Left Arm, Patient Position: Sitting, Cuff Size: Large)   Pulse 83   Ht 6\' 3"  (1.905 m)   Wt (!) 313 lb (142 kg)   BMI 39.12 kg/m  , BMI Body mass index is 39.12 kg/m. GENERAL:  Well appearing HEENT: Pupils equal round and reactive, fundi not visualized, oral mucosa unremarkable NECK:  No jugular venous distention, waveform within normal limits, carotid upstroke brisk and symmetric, no bruits LUNGS:  Mild wheezing HEART:  Irregularly irregular.  PMI not displaced or sustained,S1 and S2 within normal limits, no S3, no S4, no clicks, no rubs, no murmurs ABD:  Flat, positive bowel sounds  normal in frequency in pitch, no bruits, no rebound, no guarding, no midline pulsatile mass, no hepatomegaly, no splenomegaly EXT:  2 plus pulses throughout, trace edema, no cyanosis no clubbing SKIN:  No rashes no nodules NEURO:  Cranial nerves II through XII grossly intact, motor grossly intact throughout PSYCH:  Cognitively intact, oriented to person place and time   EKG:   06/08/2017: Atrial fibrillation. Rate 71 bpm. Left axis deviation. Nonspecific IVCD. 12/26/17: Atrial fibrillation.  Rate 95 bpm.  Cannot rule out prior anterior infarct. 07/04/18: Atrial fibrillation.  Rate 118 bpm.  Right axis deviation. 08/17/2018: EKG was not ordered. 08/06/19: Atrial fibrillation.  Rate 86 bpm.  Prior anteroseptal infarct.  Poor R wave progression.  02/08/2020: Atrial fibrillation.  Rate 79  bpm.  LAD.  Prior anterior infarct. 08/11/2020: Atrial fibrillation.  Rate 84 bpm. R axis deviation.   02/10/2021: Atrial fibrillation. Rate 83 bpm. Prior anteroseptal infarct, LAD.  Echo 11/03/16: Study Conclusions  - Left ventricle: Systolic function was normal. The estimated   ejection fraction was in the range of 60% to 65%. The study is   not technically sufficient to allow evaluation of LV diastolic   function. - Left atrium: Severely dilated. - Right atrium: Severely dilated.  Impressions:  - Technically difficult study with poor echo windows. The LVEF is   grossly normal at 60-65%, there is severe biatrial enlargment,   the IVC is dilated.  LHC 08/13/19: Conclusions: 1. Significant two-vessel coronary artery disease, including sequential 60% proximal and 70% mid LAD stenosis, which are hemodynamically significant by DFR and also involve large D2 branch, as well as chronic total occlusion of the mid RCA with bridging and left-to-right collaterals. 2. Mildly elevated left heart and pulmonary artery pressures.  Prominent v-waves noted on PCWP; question if mitral regurgitation is more severe than appreciated on recent transthoracic echocardiogram. 3. Moderately elevated right heart filling pressures. 4. Normal Fick cardiac output/index.  Diagnostic Dominance: Right   RHC RA (mean): 13 mmHg RV (S/EDP): 49/14 mmHg PA (S/D, mean): 49/22 (31) mmHg PCWP (mean): 20-25 mmHg with prominent v-waves  Ao sat: 93% PA sat: 70%  Fick CO: 85 L/min Fick CI: 3.3 L/min/m^2  Recent Labs: 07/08/2020: TSH 0.02 Repeated and verified X2. 12/08/2020: ALT 39; BUN 26; Creatinine 1.41; Hemoglobin 16.6; Platelet Count 158; Potassium 4.7; Sodium 138    Lipid Panel    Component Value Date/Time   CHOL 115 10/17/2019 1127   TRIG 47 10/17/2019 1127   HDL 55 10/17/2019 1127   CHOLHDL 2.1 10/17/2019 1127   CHOLHDL 2 10/02/2018 1114   VLDL 16.8 10/02/2018 1114   LDLCALC 49 10/17/2019 1127       Wt Readings from Last 3 Encounters:  02/10/21 (!) 313 lb (142 kg)  12/31/20 (!) 317 lb 9.6 oz (144.1 kg)  12/08/20 (!) 318 lb 6.4 oz (144.4 kg)      ASSESSMENT AND PLAN: Longstanding persistent atrial fibrillation ([place]) He continues to be asymptomatic.  Rate is well-controlled on diltiazem and metoprolol.  Anticoagulated with Xarelto.  CAD (coronary artery disease) Moderate to severe obstructive CAD.  He has exertional dyspnea but no chest pain.  This is likely multifactorial as he has gained some weight and is no longer exercising.  Given his body habitus the best way to truly rule out further obstruction would be cardiac catheterization.  He is going to work on trying to increase his exercise and see if this helps with his symptoms.  If not, we will need to  repeat his cath.  Continue aspirin, Imdur, metoprolol, Zetia.    Pure hypercholesterolemia Given his CAD, LDL goal is less than 70.  Continue Zetia.  He will come back for fasting lipids and a CMP.  Essential hypertension Blood pressures controlled on losartan, HCTZ, diltiazem, and metoprolol.  Morbid obesity ([place]) He is going to work on getting back into diet and exercise.  We will also referred him to the PREP program through the [facility].     Current medicines are reviewed at length with the patient today.  The patient does not have concerns regarding medicines.  The following changes have been made: None  Labs/ tests ordered today include:   No orders of the defined types were placed in this encounter.   Disposition:   FU with Ellanora Rayborn C. Oval Linsey, MD, [facility] in 6-8 weeks.      I,Mathew Stumpf,acting as a Education administrator for Skeet Latch, MD.,have documented all relevant documentation on the behalf of Skeet Latch, MD,as directed by  Skeet Latch, MD while in the presence of Skeet Latch, MD.  I, [place] Oval Linsey, MD have reviewed all documentation for this visit.  The documentation of the exam, diagnosis,  procedures, and orders on 02/10/2021 are all accurate and complete.   Signed, Shakyla Nolley C. Oval Linsey, MD, [facility]  02/10/2021 1:16 PM    [facility]

## 2021-02-10 NOTE — Assessment & Plan Note (Signed)
He is going to work on getting back into diet and exercise.  We will also referred him to the PREP program through the [facility].

## 2021-02-10 NOTE — Assessment & Plan Note (Signed)
Blood pressures controlled on losartan, HCTZ, diltiazem, and metoprolol.

## 2021-02-10 NOTE — Assessment & Plan Note (Signed)
He continues to be asymptomatic.  Rate is well-controlled on diltiazem and metoprolol.  Anticoagulated with Xarelto.

## 2021-02-10 NOTE — Assessment & Plan Note (Signed)
Given his CAD, LDL goal is less than 70.  Continue Zetia.  He will come back for fasting lipids and a CMP.

## 2021-02-19 ENCOUNTER — Telehealth: Payer: Self-pay | Admitting: Internal Medicine

## 2021-02-19 NOTE — Telephone Encounter (Signed)
Requesting short supply to local pharmacy while waiting on [employer] to delivery next week  1.Medication Requested: clonazePAM (KLONOPIN) 1 MG tablet  2. Pharmacy (Name, [place], [facility] [place], [address]  3. On Med List: yes  4. Last Visit with PCP: 12/31/20  5. Next visit date with PCP: 04/02/21   Agent: Please be advised that RX refills may take up to 3 business days. We ask that you follow-up with your pharmacy.

## 2021-02-19 NOTE — Progress Notes (Signed)
Spoke with [name] May 6 to discuss PREP Class; wants to attend the next June T/Th class 1pm beginning in mid June at [place]; Will call him back 1st of June to confirm class start date and schedule assessment visit.

## 2021-02-20 ENCOUNTER — Other Ambulatory Visit: Payer: Self-pay | Admitting: Internal Medicine

## 2021-02-20 MED ORDER — CLONAZEPAM 1 MG PO TABS: 1.0000 mg | ORAL_TABLET | Freq: Four times a day (QID) | ORAL | 1 refills | Status: DC | PRN

## 2021-02-20 NOTE — Telephone Encounter (Signed)
Okay.  Done.  Thanks 

## 2021-02-24 ENCOUNTER — Other Ambulatory Visit: Payer: Self-pay | Admitting: *Deleted

## 2021-02-24 ENCOUNTER — Telehealth: Payer: Self-pay | Admitting: Cardiovascular Disease

## 2021-02-24 ENCOUNTER — Telehealth: Payer: Self-pay | Admitting: *Deleted

## 2021-02-24 ENCOUNTER — Other Ambulatory Visit (HOSPITAL_BASED_OUTPATIENT_CLINIC_OR_DEPARTMENT_OTHER): Payer: Self-pay | Admitting: Cardiovascular Disease

## 2021-02-24 ENCOUNTER — Other Ambulatory Visit (HOSPITAL_BASED_OUTPATIENT_CLINIC_OR_DEPARTMENT_OTHER)
Admission: RE | Admit: 2021-02-24 | Discharge: 2021-02-24 | Disposition: A | Discharge status: Home / Self Care | Payer: Medicare PPO | Source: Ambulatory Visit | Attending: Cardiovascular Disease | Admitting: Cardiovascular Disease

## 2021-02-24 ENCOUNTER — Other Ambulatory Visit: Payer: Self-pay

## 2021-02-24 DIAGNOSIS — E785 Hyperlipidemia, unspecified: Secondary | ICD-10-CM

## 2021-02-24 DIAGNOSIS — I2510 Atherosclerotic heart disease of native coronary artery without angina pectoris: Secondary | ICD-10-CM

## 2021-02-24 LAB — LIPID PANEL
Cholesterol: 101 mg/dL (ref 0–200)
HDL: 47 mg/dL (ref >40)
LDL Cholesterol: 36 mg/dL (ref 0–99)
Total CHOL/HDL Ratio: 2.1 RATIO
Triglycerides: 91 mg/dL (ref <150)
VLDL: 18 mg/dL (ref 0–40)

## 2021-02-24 LAB — COMPREHENSIVE METABOLIC PANEL
ALT: 29 U/L (ref 0–44)
AST: 26 U/L (ref 15–41)
Albumin: 3.9 g/dL (ref 3.5–5.0)
Alkaline Phosphatase: 45 U/L (ref 38–126)
Anion gap: 7 (ref 5–15)
BUN: 31 mg/dL — ABNORMAL HIGH (ref 8–23)
CO2: 29 mmol/L (ref 22–32)
Calcium: 9.2 mg/dL (ref 8.9–10.3)
Chloride: 102 mmol/L (ref 98–111)
Creatinine, Ser: 1.37 mg/dL — ABNORMAL HIGH (ref 0.61–1.24)
GFR, Estimated: 54 mL/min — ABNORMAL LOW (ref >60)
Glucose, Bld: 129 mg/dL — ABNORMAL HIGH (ref 70–99)
Potassium: 4.7 mmol/L (ref 3.5–5.1)
Sodium: 138 mmol/L (ref 135–145)
Total Bilirubin: 0.9 mg/dL (ref 0.3–1.2)
Total Protein: 6.7 g/dL (ref 6.5–8.1)

## 2021-02-24 NOTE — Telephone Encounter (Signed)
Did not need this encounter °

## 2021-02-24 NOTE — Telephone Encounter (Signed)
Receive message from  [facility] operator-  lab tech calling - sates patient is at the [place] lab station - to have lab work drawn.  No orders are placed.  patient was seen by Dr Oval Linsey on 02/10/21-   Would like CMP ,lipid at fasting state.  orders placed.

## 2021-02-24 NOTE — Progress Notes (Signed)
Open error 

## 2021-03-02 DIAGNOSIS — I739 Peripheral vascular disease, unspecified: Secondary | ICD-10-CM | POA: Diagnosis not present

## 2021-03-02 DIAGNOSIS — B351 Tinea unguium: Secondary | ICD-10-CM | POA: Diagnosis not present

## 2021-03-04 DIAGNOSIS — R350 Frequency of micturition: Secondary | ICD-10-CM | POA: Diagnosis not present

## 2021-03-04 DIAGNOSIS — N281 Cyst of kidney, acquired: Secondary | ICD-10-CM | POA: Diagnosis not present

## 2021-03-04 DIAGNOSIS — N401 Enlarged prostate with lower urinary tract symptoms: Secondary | ICD-10-CM | POA: Diagnosis not present

## 2021-03-05 DIAGNOSIS — J301 Allergic rhinitis due to pollen: Secondary | ICD-10-CM | POA: Diagnosis not present

## 2021-03-05 DIAGNOSIS — J3089 Other allergic rhinitis: Secondary | ICD-10-CM | POA: Diagnosis not present

## 2021-03-10 DIAGNOSIS — F410 Panic disorder [episodic paroxysmal anxiety] without agoraphobia: Secondary | ICD-10-CM | POA: Diagnosis not present

## 2021-03-10 DIAGNOSIS — F331 Major depressive disorder, recurrent, moderate: Secondary | ICD-10-CM | POA: Diagnosis not present

## 2021-03-17 ENCOUNTER — Telehealth: Payer: Self-pay

## 2021-03-17 NOTE — Telephone Encounter (Signed)
PREP Assessment visit scheduled June 16 at [facility] for PREP class beginning June 21; class schedule is every Tues/Thurs from  12:30-1:45 at [facility]

## 2021-03-19 ENCOUNTER — Other Ambulatory Visit: Payer: Self-pay | Admitting: Urology

## 2021-03-19 ENCOUNTER — Other Ambulatory Visit: Payer: Self-pay | Admitting: Internal Medicine

## 2021-03-19 NOTE — Telephone Encounter (Signed)
Patient is requesting a refill of the following medications: Requested Prescriptions   Pending Prescriptions Disp Refills   DULoxetine (CYMBALTA) 60 MG capsule [Pharmacy Med Name: DULOXETINE HYDROCHLORIDE 60 MG Capsule Delayed Release Particles] 180 capsule 3    Sig: TAKE 1 CAPSULE TWICE DAILY    Date of patient request: 03/19/21  Last office visit: 12/31/20  Date of last refill: 04/28/20  Last refill amount: 180, 3   Follow up time period per chart: 04/02/21

## 2021-03-23 ENCOUNTER — Other Ambulatory Visit: Payer: Self-pay | Admitting: Internal Medicine

## 2021-03-23 DIAGNOSIS — M8588 Other specified disorders of bone density and structure, other site: Secondary | ICD-10-CM | POA: Diagnosis not present

## 2021-03-23 DIAGNOSIS — Z471 Aftercare following joint replacement surgery: Secondary | ICD-10-CM | POA: Diagnosis not present

## 2021-03-23 DIAGNOSIS — M461 Sacroiliitis, not elsewhere classified: Secondary | ICD-10-CM | POA: Diagnosis not present

## 2021-03-23 DIAGNOSIS — M1612 Unilateral primary osteoarthritis, left hip: Secondary | ICD-10-CM | POA: Diagnosis not present

## 2021-03-23 DIAGNOSIS — Z96641 Presence of right artificial hip joint: Secondary | ICD-10-CM | POA: Diagnosis not present

## 2021-03-25 DIAGNOSIS — F331 Major depressive disorder, recurrent, moderate: Secondary | ICD-10-CM | POA: Diagnosis not present

## 2021-03-25 DIAGNOSIS — F410 Panic disorder [episodic paroxysmal anxiety] without agoraphobia: Secondary | ICD-10-CM | POA: Diagnosis not present

## 2021-03-26 NOTE — Progress Notes (Signed)
[place] Report   Patient Details  Name: [name] MRN: [id number] Date of Birth: 1946/06/07 Age: 75 y.o. PCP: Cassandria Anger, MD  Vitals:   03/26/21 1115  BP: (!) 142/80  Pulse: 90  SpO2: 95%  Weight: (!) 307 lb 9.6 oz (139.5 kg)      [facility] Eval - 03/26/21 1100       Referral    Referring Provider --   [place]/HTN/CHF   Reason for referral Hypertension;Inactivity;Obesitity/Overweight    Program Start Date 03/31/21      Measurement   Neck measurement 17 Inches    Hip Circumference 56 inches    Body fat 37.5 percent      Information for Trainer   Goals --   Lose 20 pounds by end of program; re-establish exercise program   Current Exercise none    Orthopedic Concerns --   multiple operative procedures to R leg/hip/femur; osteoporosis, bilat TKR   Pertinent Medical History --   HTN, A fib, CHF, COPD   Restrictions/Precautions Fall risk;Assistive device    Medications that affect exercise Beta blocker;Medication causing dizziness/drowsiness      Timed Up and Go (TUGS)   Timed Up and Go High risk >13 seconds      Mobility and Daily Activities   I find it easy to walk up or down two or more flights of stairs. 1    I have no trouble taking out the trash. 1    I do housework such as vacuuming and dusting on my own without difficulty. 1    I can easily lift a gallon of milk (8lbs). 4    I can easily walk a mile. 1    I have no trouble reaching into high cupboards or reaching down to pick up something from the floor. 1    I do not have trouble doing out-door work such as Armed forces logistics/support/administrative officer, raking leaves, or gardening. 1      Mobility and Daily Activities   I feel younger than my age. 3    I feel independent. 3    I feel energetic. 2    I live an active life.  1    I feel strong. 1    I feel healthy. 2    I feel active as other people my age. 1      How fit and strong are you.   Fit and Strong Total Score 23            Past  Medical History:  Diagnosis Date   Anticoagulant long-term use    xarelto--- takes for AFib,  managed by pcp, Dr Alain Marion   Benign localized prostatic hyperplasia with lower urinary tract symptoms (LUTS)    CAD (coronary artery disease) cardiologist--  dr t. Oval Linsey   hx of known CAD obstruction w/ collaterals (cath done @ [facility] 12-31-2011) ;   last cardiac cath 08-13-2019  showed sig. 2V CAD involving proxLAD and CTO of the RCA/  chronic total occlusion midRCA w/ bridging and L>R collaterals   Diastolic CHF ([place])    dx [id number] hospital admission (followed by pcp)   Diffuse large B cell lymphoma [facility]) oncologist-- dr Irene Limbo--- in remission   dx 08/ 2019 -- bx left tonsill mass-- involving lymph nodes--- completed chemotherapy 10-12-2018   DJD (degenerative joint disease)    Dysthymic disorder    Elevated brain natriuretic peptide (BNP) level    Environmental allergies    GAD (  generalized anxiety disorder)    GERD (gastroesophageal reflux disease)    History of falling    multiple times   History of Graves' disease    1987 s/p total thyroidectomy   History of kidney stones    History of scabies    07/ 2014  resolved   History of syncope    per D/C note in epic 02-21-2013 ?vasovagal   History of vertebral compression fracture    05/ 2014 --  L1, no surgical intervention   HLD (hyperlipidemia)    Hyperlipidemia    Hypertension    Hypogonadism in male    IDA (iron deficiency anemia)    Lumbago    OA (osteoarthritis)    OSA on CPAP    cpap machine-settings 17   Osteoporosis    Severe   Persistent atrial fibrillation [facility])    cardiologist--- dr Alfonso Patten. [place]   Plaque psoriasis    dermatologist--- dr Harriett Sine--- currently taking otezla   Post-surgical hypothyroidism    followed by pcp---  s/p total thyroidectomy for graves disease in 1987   Pure hypercholesterolemia 02/10/2021   Renal calculus, right    Thrombocytopenia ([place])    Unsteady gait    uses walker   Wears  hearing aid in both ears    Past Surgical History:  Procedure Laterality Date   APPENDECTOMY  2011 approx.    CARDIAC CATHETERIZATION  12-31-2011   dr j. Beatrix Fetters @[facility]    normal LVF w/ multivessel CAD,  occluded RCA w/ colleterals   CATARACT EXTRACTION W/ INTRAOCULAR LENS  IMPLANT, BILATERAL  2016 approx.   COLONOSCOPY     CYSTOSCOPY WITH RETROGRADE PYELOGRAM, URETEROSCOPY AND STENT PLACEMENT Left 11/07/2017   Procedure: CYSTOSCOPY WITH RETROGRADE PYELOGRAM, URETEROSCOPY AND STENT PLACEMENT;  Surgeon: Cleon Gustin, MD;  Location: [facility];  Service: Urology;  Laterality: Left;   CYSTOSCOPY WITH RETROGRADE PYELOGRAM, URETEROSCOPY AND STENT PLACEMENT Right 10/22/2019   Procedure: CYSTOSCOPY WITH RETROGRADE PYELOGRAM, URETEROSCOPY AND STENT PLACEMENT;  Surgeon: Cleon Gustin, MD;  Location: [facility];  Service: Urology;  Laterality: Right;   CYSTOSCOPY/URETEROSCOPY/HOLMIUM LASER/STENT PLACEMENT Right 03/11/2017   Procedure: CYSTOSCOPY/URETEROSCOPYSTENT PLACEMENT right ureter retrograde pylegram;  Surgeon: Cleon Gustin, MD;  Location: [facility];  Service: Urology;  Laterality: Right;   HARDWARE REMOVAL  10/05/2012   Procedure: HARDWARE REMOVAL;  Surgeon: [name], MD;  Location: [facility];  Service: Orthopedics;  Laterality: Right;  REMOVING  STRYKER  GAMMA NAIL   HARDWARE REMOVAL Right 07/03/2013   Procedure: HARDWARE REMOVAL RIGHT TIBIA ;  Surgeon: Rozanna Box, MD;  Location: [place];  Service: Orthopedics;  Laterality: Right;   HIP CLOSED REDUCTION Right 10/15/2013   Procedure: CLOSED MANIPULATION HIP;  Surgeon: [name], MD;  Location: [facility];  Service: Orthopedics;  Laterality: Right;   HOLMIUM LASER APPLICATION Right 02/11/2991   Procedure: HOLMIUM LASER APPLICATION;  Surgeon: Cleon Gustin, MD;  Location: [facility];  Service: Urology;  Laterality: Right;   HOLMIUM LASER APPLICATION Left 02/04/8340   Procedure: HOLMIUM LASER APPLICATION;  Surgeon: Cleon Gustin, MD;  [facility];  Service: Urology;  Laterality: Left;   HOLMIUM LASER APPLICATION Left 06/16/2228   Procedure: HOLMIUM LASER APPLICATION;  Surgeon: Cleon Gustin, MD;  [facility];  Service: Urology;  Laterality: Left;   HOLMIUM LASER APPLICATION Right 7/98/9211   Procedure: HOLMIUM LASER APPLICATION;  Surgeon: Cleon Gustin, MD;  Location: [facility];  Service: Urology;  Laterality: Right;   INCISION AND DRAINAGE HIP Right 11/16/2013   Procedure: IRRIGATION AND DEBRIDEMENT RIGHT HIP;  Surgeon: [name], MD;  [facility];  Service: Orthopedics;  Laterality: Right;   INTRAVASCULAR PRESSURE WIRE/FFR STUDY N/A 08/13/2019   Procedure: INTRAVASCULAR PRESSURE WIRE/FFR STUDY;  Surgeon: Nelva Bush, MD;  Location: [place] [facility];  Service: Cardiovascular;  Laterality: N/A;   IR IMAGING GUIDED PORT INSERTION  06/22/2018   IR NEPHROSTOMY PLACEMENT LEFT  03/28/2019   IR URETERAL STENT LEFT NEW ACCESS W/O SEP NEPHROSTOMY CATH  10/24/2017   IR URETERAL STENT LEFT NEW ACCESS W/O SEP NEPHROSTOMY CATH  03/26/2019   NEPHROLITHOTOMY Right 02/08/2017   Procedure: NEPHROLITHOTOMY PERCUTANEOUS WITH SURGEON ACCESS;  Surgeon: Cleon Gustin, MD;  [facility];  Service: Urology;  Laterality: Right;   NEPHROLITHOTOMY Left 10/24/2017   Procedure: NEPHROLITHOTOMY PERCUTANEOUS;  Surgeon: Cleon Gustin, MD;  [facility];  Service: Urology;  Laterality: Left;   NEPHROLITHOTOMY Left 03/26/2019   Procedure: NEPHROLITHOTOMY PERCUTANEOUS;  Surgeon: Cleon Gustin, MD;  [facility];  Service: Urology;  Laterality: Left;  2 HRS   NEPHROLITHOTOMY Left 04/17/2019   Procedure: NEPHROLITHOTOMY PERCUTANEOUS;  Surgeon: Cleon Gustin, MD;  Location[facility];  Service: Urology;  Laterality: Left;  2 HRS   ORIF TIBIA FRACTURE Right 02/06/2013   Procedure: OPEN REDUCTION INTERNAL FIXATION (ORIF) TIBIA FRACTURE WITH IM ROD FIBULA;  Surgeon: Rozanna Box, MD;  Location: [place];  Service: Orthopedics;  Laterality: Right;   ORIF TIBIA FRACTURE Right 07/03/2013   Procedure: RIGHT TIBIA NON UNION REPAIR ;  Surgeon: Rozanna Box, MD;  Location: [place];  Service: Orthopedics;  Laterality: Right;   ORIF WRIST FRACTURE  10/02/2012   Procedure: OPEN REDUCTION INTERNAL FIXATION (ORIF) WRIST FRACTURE;  Surgeon: Roseanne Kaufman, MD;  Location[facility];  Service: Orthopedics;  Laterality: Right;  WITH   ANTIBIOTIC  CEMENT   ORIF WRIST FRACTURE Left 10/28/2013   Procedure: OPEN REDUCTION INTERNAL FIXATION (ORIF) WRIST FRACTURE with allograft;  Surgeon: Roseanne Kaufman, MD;  Location[facility];  Service: Orthopedics;  Laterality: Left;  DVR Plate   QUADRICEPS TENDON REPAIR Left 07/15/2017   Procedure: REPAIR QUADRICEP TENDON;  Surgeon: Frederik Pear, MD;  Location: [place];  Service: Orthopedics;  Laterality: Left;   RIGHT/LEFT HEART CATH AND CORONARY ANGIOGRAPHY N/A 08/13/2019   Procedure: RIGHT/LEFT HEART CATH AND CORONARY ANGIOGRAPHY;  Surgeon: Nelva Bush, MD;  Location: [place] [facility];  Service: Cardiovascular;  Laterality: N/A;   THYROIDECTOMY  02/1986   TOTAL HIP ARTHROPLASTY Right 03-16-2016   @[facility]    TOTAL HIP REVISION  10/05/2012   Procedure: TOTAL HIP REVISION;  Surgeon: [name], MD;  Location[facility];  Service: Orthopedics;  Laterality: Right;  RIGHT TOTAL HIP REVISION   TOTAL HIP REVISION Right 09/17/2013   Procedure: REVISION RIGHT TOTAL HIP ARTHROPLASTY ;  Surgeon: [name], MD;  [facility];  Service: Orthopedics;  Laterality: Right;   TOTAL HIP REVISION Right 10/26/2013   Procedure: REVISION RIGHT TOTAL HIP ARTHROPLASTY;  Surgeon: [name], MD;  [facility];  Service: Orthopedics;  Laterality: Right;   TOTAL KNEE ARTHROPLASTY Bilateral right 03-15-2011;  left 06-30-2011   TOTAL KNEE REVISION Left 04/11/2017   Procedure: TOTAL KNEE REVISION PATELLA and TIBIA;  Surgeon: Frederik Pear, MD;  Location: [place];  Service:  Orthopedics;  Laterality: Left;   Social History   Tobacco Use  Smoking Status  Former   Packs/day: 1.00   Years: 50.00   Pack years: 50.00   Types: Cigarettes   Quit date: 01/12/2012   Years since quitting: 9.2  Smokeless Tobacco Never    PREP Class T/Th 12:30-1:45 starting June 21.  Yevonne Aline 03/26/2021, 11:24 AM

## 2021-03-31 NOTE — Progress Notes (Signed)
[facility] PREP Weekly Session   Patient Details  Name: [name] MRN: [id number] Date of Birth: [birth year] Age: 75 y.o. PCP: Plotnikov, Evie Lacks, MD  There were no vitals filed for this visit.   [facility] Weekly seesion - 03/31/21 1500       Weekly Session   Topic Discussed --   First meeting: introductions, distribution and review PREP book/program, tour of facility; offered option to exercise on cardio machines   Classes attended to date [place] 03/31/2021, 3:57 PM

## 2021-04-01 ENCOUNTER — Other Ambulatory Visit: Payer: Self-pay

## 2021-04-01 ENCOUNTER — Ambulatory Visit: Payer: Medicare PPO | Admitting: Cardiovascular Disease

## 2021-04-01 ENCOUNTER — Encounter: Payer: Self-pay | Admitting: Cardiovascular Disease

## 2021-04-01 DIAGNOSIS — I4811 Longstanding persistent atrial fibrillation: Secondary | ICD-10-CM | POA: Diagnosis not present

## 2021-04-01 DIAGNOSIS — E7800 Pure hypercholesterolemia, unspecified: Secondary | ICD-10-CM

## 2021-04-01 DIAGNOSIS — I5032 Chronic diastolic (congestive) heart failure: Secondary | ICD-10-CM

## 2021-04-01 NOTE — Assessment & Plan Note (Signed)
Rates are well-controlled.  Continue metoprolol and Xarelto.

## 2021-04-01 NOTE — Assessment & Plan Note (Signed)
Volume status is stable.  Blood pressure is well-controlled.

## 2021-04-01 NOTE — Assessment & Plan Note (Signed)
He has been moving more and recently started in the prep exercise program.  He is feeling well and breathing is improving.  He was congratulated on his weight loss.

## 2021-04-01 NOTE — Assessment & Plan Note (Signed)
Medically managed.  He is on aspirin, metoprolol, Imdur, and Zetia.  Lipids are well-controlled.

## 2021-04-01 NOTE — Patient Instructions (Signed)
Medication Instructions:  Your physician recommends that you continue on your current medications as directed. Please refer to the Current Medication list given to you today.   *If you need a refill on your cardiac medications before your next appointment, please call your pharmacy*  Lab Work: NONE  Testing/Procedures: NONE  Follow-Up: At [facility], you and your health needs are our priority.  As part of our continuing mission to provide you with exceptional heart care, we have created designated Provider Care Teams.  These Care Teams include your primary Cardiologist (physician) and Advanced Practice Providers (APPs -  Physician Assistants and Nurse Practitioners) who all work together to provide you with the care you need, when you need it.  We recommend signing up for the patient portal called "MyChart".  Sign up information is provided on this After Visit Summary.  MyChart is used to connect with patients for Virtual Visits (Telemedicine).  Patients are able to view lab/test results, encounter notes, upcoming appointments, etc.  Non-urgent messages can be sent to your provider as well.   To learn more about what you can do with MyChart, go to [email or web address].    Your next appointment:   6 month(s)  The format for your next appointment:   In Person  Provider:   DR [facility] [place]

## 2021-04-01 NOTE — Assessment & Plan Note (Signed)
Lipids are well-controlled.  LDL was 36 on 02/2021.  Continue Zetia.

## 2021-04-01 NOTE — Progress Notes (Signed)
Cardiology Office Note  Date:  04/01/2021   ID:  Ly, Bacchi [birth year], MRN [id number]  PCP:  Cassandria Anger, MD  Cardiologist:   Skeet Latch, MD   No chief complaint on file.   History of Present Illness: [name] is a 75 y.o. male with medically managed moderate to severe CAD, long-standing-persistent atrial fibrillation, chronic diastolic heart failure, hypertension, hyperlipidemia, sleep apnea on CPAP, diffuse large B cell lymphoma (remission), hypothyroidism and prior tobacco abuse here for follow-up.  He was initially seen 05/2017 to establish care.  Mr. Afzal was previously a patient of [name] and last saw him 01/2016.  At that time he was preparing for left hip surgery.  [name] recommended stress testing but he declined.  He underwent surgery with no complications.  He was initially diagnosed with atrial fibrillation around 2012.  He was started on diltiazem and amiodarone.  He was noted to be in persistent atrial fibrillation.  Therefore amiodarone was discontinued. At the time of his atrial fibrillation diagnosis he also underwent a heart catheterization. He reportedly had 90% obstruction of an unknown vessel that had developed collaterals. He was started on Ranexa.  He had an echo 11/03/16 revealed LVEF of 60-65%. Severe left and right atrial enlargement was noted and the IVC was dilated.  He had no angina and so Ranexa was discontinued.  Consideration was made for starting a beta-blocker instead of a calcium channel blocker.  However we decided not to make too many changes at one time.  Mr. Shuman was diagnosed with diffuse large B cell lymphoma on 05/2018.  He completed 6 cycles of chemotherapy with rituximab, cyclophosphamide, doxorubicin, vincristine, and prednisone and is now in surveillance.  He had a baseline echo 06/2018 that revealed LVEF 50 to 55% and normal diastolic function.  He had apical hypokinesis and GLS was -13.9. Heart rate  was poorly controlled so metoprolol was added to his regimen.   He was seen in he ED and BNP was mildly elevated to 283.  He was started on lasix 20mg  daily and has noticed an improvement in his breathing.  He was referred for an echo 06/28/19 that revealed LVEF 50-55%.  GLS was -10.1%. The RV had mildly reduced systolic function.  He noted frequent urination and his blood pressure was poorly controlled so Lasix was switched to hydrochlorothiazide.  There was concern that his symptoms could be due to ischemia.  He underwent left heart catheterization 08/2019.  He was found to have significant two-vessel CAD including sequential 60 and 70% mid LAD lesions and chronic total occlusion of the mid RCA.  There were bridging left to right collaterals.  Right atrial pressure was 13.  Pulmonary capillary wedge pressure was 20 to 25 mmHg.  There was a consideration for two-vessel CABG versus medical management.  He was not deemed to be a good candidate for surgery.  He was started on Imdur.  Since then he has not had any chest pain and his breathing has been stable.  He struggled with 3 kidney stones and frequent urination. HCTZ was reduced and Losartan was increased.  On 02/10/2021, he was feeling ok overall. He notes he is gaining more weight recently and knows he needs to improve his diet and exercise. Pre-COVID he would walk in the pool and do water aerobics at the [facility], but has not been able to since.  Today, he reports having his first class with the PREP program. He enjoyed it  and is looking forward to learning how to use all of the exercise machines. He continues to have some shortness of breath, however he believes that some of this is due to being overweight and at other times forgetting to breathe during activity. There is also some bilateral LE edema that is typically mild. Recently he went to see [employer] in a [place]. He walked in and out of the building without feeling short of breath. Of late, he is  feeling a little more confident and will try to get himself outside of the house more often. He denies any chest pain, palpitations, or exertional symptoms. No headaches, lightheadedness, or syncope to report. Also has no orthopnea or PND.   Past Medical History:  Diagnosis Date   Anticoagulant long-term use    xarelto--- takes for AFib,  managed by pcp, Dr Alain Marion   Benign localized prostatic hyperplasia with lower urinary tract symptoms (LUTS)    CAD (coronary artery disease) cardiologist--  dr t. Oval Linsey   hx of known CAD obstruction w/ collaterals (cath done @ [facility] 12-31-2011) ;   last cardiac cath 08-13-2019  showed sig. 2V CAD involving proxLAD and CTO of the RCA/  chronic total occlusion midRCA w/ bridging and L>R collaterals   Diastolic CHF ([place])    dx 08-07-2535 hospital admission (followed by pcp)   Diffuse large B cell lymphoma [facility]) oncologist-- dr Irene Limbo--- in remission   dx 08/ 2019 -- bx left tonsill mass-- involving lymph nodes--- completed chemotherapy 10-12-2018   DJD (degenerative joint disease)    Dysthymic disorder    Elevated brain natriuretic peptide (BNP) level    Environmental allergies    GAD (generalized anxiety disorder)    GERD (gastroesophageal reflux disease)    History of falling    multiple times   History of Graves' disease    1987 s/p total thyroidectomy   History of kidney stones    History of scabies    07/ 2014  resolved   History of syncope    per D/C note in epic 02-21-2013 ?vasovagal   History of vertebral compression fracture    05/ 2014 --  L1, no surgical intervention   HLD (hyperlipidemia)    Hyperlipidemia    Hypertension    Hypogonadism in male    IDA (iron deficiency anemia)    Lumbago    OA (osteoarthritis)    OSA on CPAP    cpap machine-settings 17   Osteoporosis    Severe   Persistent atrial fibrillation [facility])    cardiologist--- dr Alfonso Patten. [place]   Plaque psoriasis    dermatologist--- dr Harriett Sine--- currently taking  otezla   Post-surgical hypothyroidism    followed by pcp---  s/p total thyroidectomy for graves disease in 1987   Pure hypercholesterolemia 02/10/2021   Renal calculus, right    Thrombocytopenia ([place])    Unsteady gait    uses walker   Wears hearing aid in both ears     Past Surgical History:  Procedure Laterality Date   APPENDECTOMY  2011 approx.    CARDIAC CATHETERIZATION  12-31-2011   dr j. Beatrix Fetters @[facility]    normal LVF w/ multivessel CAD,  occluded RCA w/ colleterals   CATARACT EXTRACTION W/ INTRAOCULAR LENS  IMPLANT, BILATERAL  2016 approx.   COLONOSCOPY     CYSTOSCOPY WITH RETROGRADE PYELOGRAM, URETEROSCOPY AND STENT PLACEMENT Left 11/07/2017   Procedure: CYSTOSCOPY WITH RETROGRADE PYELOGRAM, URETEROSCOPY AND STENT PLACEMENT;  Surgeon: Cleon Gustin, MD;  Location: [facility];  Service: Urology;  Laterality: Left;   CYSTOSCOPY WITH RETROGRADE PYELOGRAM, URETEROSCOPY AND STENT PLACEMENT Right 10/22/2019   Procedure: CYSTOSCOPY WITH RETROGRADE PYELOGRAM, URETEROSCOPY AND STENT PLACEMENT;  Surgeon: Cleon Gustin, MD;  [facility];  Service: Urology;  Laterality: Right;   CYSTOSCOPY/URETEROSCOPY/HOLMIUM LASER/STENT PLACEMENT Right 03/11/2017   Procedure: CYSTOSCOPY/URETEROSCOPYSTENT PLACEMENT right ureter retrograde pylegram;  Surgeon: Cleon Gustin, MD;  [facility];  Service: Urology;  Laterality: Right;   HARDWARE REMOVAL  10/05/2012   Procedure: HARDWARE REMOVAL;  Surgeon: [name], MD;  [facility];  Service: Orthopedics;  Laterality: Right;  REMOVING  STRYKER  GAMMA NAIL   HARDWARE REMOVAL Right 07/03/2013   Procedure: HARDWARE REMOVAL RIGHT TIBIA ;  Surgeon: Rozanna Box, MD;  Location: [place];  Service: Orthopedics;  Laterality: Right;   HIP CLOSED REDUCTION Right 10/15/2013   Procedure: CLOSED MANIPULATION HIP;  Surgeon: [name], MD;  [facility];  Service: Orthopedics;  Laterality: Right;   HOLMIUM LASER APPLICATION  Right 03/12/7034   Procedure: HOLMIUM LASER APPLICATION;  Surgeon: Cleon Gustin, MD;  [facility];  Service: Urology;  Laterality: Right;   HOLMIUM LASER APPLICATION Left 0/06/3817   Procedure: HOLMIUM LASER APPLICATION;  Surgeon: Cleon Gustin, MD;  [facility];  Service: Urology;  Laterality: Left;   HOLMIUM LASER APPLICATION Left 11/19/9369   Procedure: HOLMIUM LASER APPLICATION;  Surgeon: Cleon Gustin, MD;  [facility];  Service: Urology;  Laterality: Left;   HOLMIUM LASER APPLICATION Right 6/96/7893   Procedure: HOLMIUM LASER APPLICATION;  Surgeon: Cleon Gustin, MD;  Location[facility];  Service: Urology;  Laterality: Right;   INCISION AND DRAINAGE HIP Right 11/16/2013   Procedure: IRRIGATION AND DEBRIDEMENT RIGHT HIP;  Surgeon: [name], MD;  [facility];  Service: Orthopedics;  Laterality: Right;   INTRAVASCULAR PRESSURE WIRE/FFR STUDY N/A 08/13/2019   Procedure: INTRAVASCULAR PRESSURE WIRE/FFR STUDY;  Surgeon: Nelva Bush, MD;  Location: [place] [facility];  Service: Cardiovascular;  Laterality: N/A;   IR IMAGING GUIDED PORT INSERTION  06/22/2018   IR NEPHROSTOMY PLACEMENT LEFT  03/28/2019   IR URETERAL STENT LEFT NEW ACCESS W/O SEP NEPHROSTOMY CATH  10/24/2017   IR URETERAL STENT LEFT NEW ACCESS W/O SEP NEPHROSTOMY CATH  03/26/2019   NEPHROLITHOTOMY Right 02/08/2017   Procedure: NEPHROLITHOTOMY PERCUTANEOUS WITH SURGEON ACCESS;  Surgeon: Cleon Gustin, MD;  [facility];  Service: Urology;  Laterality: Right;   NEPHROLITHOTOMY Left 10/24/2017   Procedure: NEPHROLITHOTOMY PERCUTANEOUS;  Surgeon: Cleon Gustin, MD;  [facility];  Service: Urology;  Laterality: Left;   NEPHROLITHOTOMY Left 03/26/2019   Procedure: NEPHROLITHOTOMY PERCUTANEOUS;  Surgeon: Cleon Gustin, MD;  [facility];  Service: Urology;  Laterality: Left;  2 HRS   NEPHROLITHOTOMY Left 04/17/2019   Procedure: NEPHROLITHOTOMY  PERCUTANEOUS;  Surgeon: Cleon Gustin, MD;  [facility];  Service: Urology;  Laterality: Left;  2 HRS   ORIF TIBIA FRACTURE Right 02/06/2013   Procedure: OPEN REDUCTION INTERNAL FIXATION (ORIF) TIBIA FRACTURE WITH IM ROD FIBULA;  Surgeon: Rozanna Box, MD;  Location: [place];  Service: Orthopedics;  Laterality: Right;   ORIF TIBIA FRACTURE Right 07/03/2013   Procedure: RIGHT TIBIA NON UNION REPAIR ;  Surgeon: Rozanna Box, MD;  Location: [place];  Service: Orthopedics;  Laterality: Right;   ORIF WRIST FRACTURE  10/02/2012   Procedure: OPEN REDUCTION INTERNAL FIXATION (ORIF) WRIST FRACTURE;  Surgeon: Roseanne Kaufman, MD;  [facility];  Service: Orthopedics;  Laterality: Right;  WITH   ANTIBIOTIC  CEMENT   ORIF WRIST FRACTURE Left 10/28/2013   Procedure: OPEN REDUCTION INTERNAL FIXATION (ORIF) WRIST FRACTURE with allograft;  Surgeon: Roseanne Kaufman, MD;  [facility];  Service: Orthopedics;  Laterality: Left;  DVR Plate   QUADRICEPS TENDON REPAIR Left 07/15/2017   Procedure: REPAIR QUADRICEP TENDON;  Surgeon: Frederik Pear, MD;  Location: [place];  Service: Orthopedics;  Laterality: Left;   RIGHT/LEFT HEART CATH AND CORONARY ANGIOGRAPHY N/A 08/13/2019   Procedure: RIGHT/LEFT HEART CATH AND CORONARY ANGIOGRAPHY;  Surgeon: Nelva Bush, MD;  Location: [place] [facility];  Service: Cardiovascular;  Laterality: N/A;   THYROIDECTOMY  02/1986   TOTAL HIP ARTHROPLASTY Right 03-16-2016   @[facility]    TOTAL HIP REVISION  10/05/2012   Procedure: TOTAL HIP REVISION;  Surgeon: [name], MD;  [facility];  Service: Orthopedics;  Laterality: Right;  RIGHT TOTAL HIP REVISION   TOTAL HIP REVISION Right 09/17/2013   Procedure: REVISION RIGHT TOTAL HIP ARTHROPLASTY ;  Surgeon: [name], MD;  [facility];  Service: Orthopedics;  Laterality: Right;   TOTAL HIP REVISION Right 10/26/2013   Procedure: REVISION RIGHT TOTAL HIP ARTHROPLASTY;  Surgeon: [name], MD;  [facility];   Service: Orthopedics;  Laterality: Right;   TOTAL KNEE ARTHROPLASTY Bilateral right 03-15-2011;  left 06-30-2011   TOTAL KNEE REVISION Left 04/11/2017   Procedure: TOTAL KNEE REVISION PATELLA and TIBIA;  Surgeon: Frederik Pear, MD;  Location: [place];  Service: Orthopedics;  Laterality: Left;     Current Outpatient Medications  Medication Sig Dispense Refill   acetaminophen (TYLENOL) 500 MG tablet Take 1,000 mg by mouth daily as needed for moderate pain.     alfuzosin (UROXATRAL) 10 MG 24 hr tablet TAKE 1 TABLET AT BEDTIME 90 tablet 3   amoxicillin (AMOXIL) 500 MG capsule Take 2,000 mg by mouth See admin instructions. Take 4 capsules (2000 mg) by mouth 1 hour prior to dental work     aspirin EC 81 MG tablet Take 81 mg by mouth daily.     BELSOMRA 15 MG TABS TAKE 1 TABLET BY MOUTH  DAILY (Patient taking differently: Take 1 tablet by mouth at bedtime as needed.) 90 tablet 1   buPROPion (WELLBUTRIN XL) 300 MG 24 hr tablet TAKE 1 TABLET EVERY DAY WITH BREAKFAST 90 tablet 3   Calcium Citrate-Vitamin D (CALCIUM CITRATE + D PO) Take 1 tablet by mouth 2 (two) times daily.      Cholecalciferol (VITAMIN D-3 PO) Take 2,000 Units by mouth 2 (two) times daily.      clonazePAM (KLONOPIN) 1 MG tablet Take 1 tablet (1 mg total) by mouth every 6 (six) hours as needed. for anxiety 360 tablet 1   denosumab (PROLIA) 60 MG/ML SOSY injection Inject 60 mg as directed every 6 (six) months. Last injection: Feb 2020 Next injection: August 2020     diltiazem (CARDIZEM CD) 240 MG 24 hr capsule TAKE 1 CAPSULE EVERY DAY 90 capsule 3   DULoxetine (CYMBALTA) 60 MG capsule TAKE 1 CAPSULE TWICE DAILY 180 capsule 3   ezetimibe (ZETIA) 10 MG tablet TAKE 1 TABLET EVERY DAY. 90 tablet 3   ferrous sulfate 325 (65 FE) MG tablet Take 325 mg by mouth 2 (two) times daily with a meal.      hydrochlorothiazide (HYDRODIURIL) 12.5 MG tablet Take 1 tablet (12.5 mg total) by mouth daily.  90 tablet 3   HYDROcodone-acetaminophen (NORCO) 7.5-325 MG  tablet Take 1 tablet by mouth every 6 (six) hours as needed for severe pain. 120 tablet 0   ipratropium (ATROVENT) 0.03 % nasal spray as needed.     isosorbide mononitrate (IMDUR) 30 MG 24 hr tablet Take 1 tablet (30 mg total) by mouth daily. 90 tablet 3   levothyroxine (SYNTHROID) 200 MCG tablet TAKE 1 TABLET EVERY DAY AT 6AM 90 tablet 3   loratadine (CLARITIN) 10 MG tablet Take 10 mg by mouth daily.      losartan (COZAAR) 50 MG tablet TAKE 1 TABLET EVERY DAY 90 tablet 2   Magnesium 500 MG CAPS Take 1 capsule (500 mg total) by mouth daily. (Patient taking differently: Take 500 mg by mouth every evening.) 30 capsule 1   metoprolol tartrate (LOPRESSOR) 25 MG tablet Take 1 tablet (25 mg total) by mouth 2 (two) times daily. 180 tablet 3   mirabegron ER (MYRBETRIQ) 25 MG TB24 tablet Take 25 mg by mouth at bedtime.      Multiple Vitamin (MULTIVITAMIN WITH MINERALS) TABS tablet Take 1 tablet by mouth daily. Men's One-A-Day 50+     nitroGLYCERIN (NITROSTAT) 0.4 MG SL tablet Place 0.4 mg under the tongue every 5 (five) minutes as needed for chest pain. x3 doses as needed for chest pain     omeprazole (PRILOSEC) 40 MG capsule TAKE 1 CAPSULE EVERY DAY BEFORE BREAKFAST 90 capsule 2   Potassium Citrate 15 MEQ (1620 MG) TBCR TAKE 1 TABLET TWICE DAILY 180 tablet 3   Probiotic Product (PROBIOTIC PO) Take 1 capsule by mouth daily with breakfast.     Risankizumab-rzaa (SKYRIZI PEN [place]) Inject into the skin.     testosterone enanthate (DELATESTRYL) 200 MG/ML injection INJECT 1ML (200MG ) INTO THE MUSCLE EVERY 14 DAYS. DISCARD VIAL AFTER 28 DAYS. 5 mL 3   tiZANidine (ZANAFLEX) 4 MG tablet TAKE 1 TABLET EVERY 6 HOURS AS NEEDED FOR MUSCLE SPASM(S) 360 tablet 1   vitamin C (ASCORBIC ACID) 500 MG tablet Take 500 mg by mouth daily.     XARELTO 10 MG TABS tablet TAKE 1 TABLET EVERY DAY 90 tablet 1   No current facility-administered medications for this visit.    Allergies:   Short ragweed pollen ext    Social  History:  The patient  reports that he quit smoking about 9 years ago. His smoking use included cigarettes. He has a 50.00 pack-year smoking history. He has never used smokeless tobacco. He reports previous alcohol use. He reports that he does not use drugs.   Family History:  The patient's family history includes Alcohol abuse in his father and sister; Arthritis in his mother; Asthma in his father; CAD (age of onset: 53) in his father.    ROS:   Please see the history of present illness. (+) Shortness of breath (+) Bilateral LE edema All other systems are reviewed and negative.    PHYSICAL EXAM: VS:  BP 112/60   Pulse 81   Ht 6\' 3"  (1.905 m)   Wt (!) 311 lb 9.6 oz (141.3 kg)   SpO2 95%   BMI 38.95 kg/m  , BMI Body mass index is 38.95 kg/m. GENERAL:  Well appearing HEENT: Pupils equal round and reactive, fundi not visualized, oral mucosa unremarkable NECK:  No jugular venous distention, waveform within normal limits, carotid upstroke brisk and symmetric, no bruits LUNGS:  CTAB HEART:  Irregularly irregular.  PMI not displaced or sustained,S1 and S2 within normal  limits, no S3, no S4, no clicks, no rubs, no murmurs ABD:  Flat, positive bowel sounds normal in frequency in pitch, no bruits, no rebound, no guarding, no midline pulsatile mass, no hepatomegaly, no splenomegaly EXT:  2 plus pulses throughout, trace edema, no cyanosis no clubbing SKIN:  No rashes no nodules NEURO:  Cranial nerves II through XII grossly intact, motor grossly intact throughout PSYCH:  Cognitively intact, oriented to person place and time   EKG:   06/08/2017: Atrial fibrillation. Rate 71 bpm. Left axis deviation. Nonspecific IVCD. 12/26/17: Atrial fibrillation.  Rate 95 bpm.  Cannot rule out prior anterior infarct. 07/04/18: Atrial fibrillation.  Rate 118 bpm.  Right axis deviation. 08/17/2018: EKG was not ordered. 08/06/19: Atrial fibrillation.  Rate 86 bpm.  Prior anteroseptal infarct.  Poor R wave  progression.  02/08/2020: Atrial fibrillation.  Rate 79 bpm.  LAD.  Prior anterior infarct. 08/11/2020: Atrial fibrillation.  Rate 84 bpm. R axis deviation.   02/10/2021: Atrial fibrillation. Rate 83 bpm. Prior anteroseptal infarct, LAD. 04/01/2021: EKG is not ordered today.   Echo 11/03/16: Study Conclusions   - Left ventricle: Systolic function was normal. The estimated   ejection fraction was in the range of 60% to 65%. The study is   not technically sufficient to allow evaluation of LV diastolic   function. - Left atrium: Severely dilated. - Right atrium: Severely dilated.   Impressions:   - Technically difficult study with poor echo windows. The LVEF is   grossly normal at 60-65%, there is severe biatrial enlargment,   the IVC is dilated.  LHC 08/13/19: Conclusions: Significant two-vessel coronary artery disease, including sequential 60% proximal and 70% mid LAD stenosis, which are hemodynamically significant by DFR and also involve large D2 branch, as well as chronic total occlusion of the mid RCA with bridging and left-to-right collaterals. Mildly elevated left heart and pulmonary artery pressures.  Prominent v-waves noted on PCWP; question if mitral regurgitation is more severe than appreciated on recent transthoracic echocardiogram. Moderately elevated right heart filling pressures. Normal Fick cardiac output/index.  Diagnostic Dominance: Right   RHC RA (mean): 13 mmHg RV (S/EDP): 49/14 mmHg PA (S/D, mean): 49/22 (31) mmHg PCWP (mean): 20-25 mmHg with prominent v-waves  Ao sat: 93% PA sat: 70%  Fick CO: 85 L/min Fick CI: 3.3 L/min/m^2   Recent Labs: 07/08/2020: TSH 0.02 Repeated and verified X2. 12/08/2020: Hemoglobin 16.6; Platelet Count 158 02/24/2021: ALT 29; BUN 31; Creatinine, Ser 1.37; Potassium 4.7; Sodium 138    Lipid Panel    Component Value Date/Time   CHOL 101 02/24/2021 1037   CHOL 115 10/17/2019 1127   TRIG 91 02/24/2021 1037   HDL 47 02/24/2021  1037   HDL 55 10/17/2019 1127   CHOLHDL 2.1 02/24/2021 1037   VLDL 18 02/24/2021 1037   LDLCALC 36 02/24/2021 1037   LDLCALC 49 10/17/2019 1127      Wt Readings from Last 3 Encounters:  04/01/21 (!) 311 lb 9.6 oz (141.3 kg)  03/26/21 (!) 307 lb 9.6 oz (139.5 kg)  02/10/21 (!) 313 lb (142 kg)      ASSESSMENT AND PLAN: Chronic diastolic heart failure (HCC) Volume status is stable.  Blood pressure is well-controlled.  Pure hypercholesterolemia Lipids are well-controlled.  LDL was 36 on 02/2021.  Continue Zetia.  Morbid obesity ([place]) He has been moving more and recently started in the prep exercise program.  He is feeling well and breathing is improving.  He was congratulated on his weight loss.  CAD (coronary artery disease) Medically managed.  He is on aspirin, metoprolol, Imdur, and Zetia.  Lipids are well-controlled.  Longstanding persistent atrial fibrillation (HCC) Rates are well-controlled.  Continue metoprolol and Xarelto.     Current medicines are reviewed at length with the patient today.  The patient does not have concerns regarding medicines.  The following changes have been made: None  Labs/ tests ordered today include:   No orders of the defined types were placed in this encounter.   Disposition:   FU with Hawkins Seaman C. Oval Linsey, MD, [facility] in 6 months.      I,Mathew Stumpf,acting as a Education administrator for Skeet Latch, MD.,have documented all relevant documentation on the behalf of Skeet Latch, MD,as directed by  Skeet Latch, MD while in the presence of Skeet Latch, MD.  I, Frederickson Oval Linsey, MD have reviewed all documentation for this visit.  The documentation of the exam, diagnosis, procedures, and orders on 04/01/2021 are all accurate and complete.   Signed, Maddilynn Esperanza C. Oval Linsey, MD, [facility]  04/01/2021 11:20 AM    [place]

## 2021-04-02 ENCOUNTER — Encounter: Payer: Self-pay | Admitting: Internal Medicine

## 2021-04-02 ENCOUNTER — Ambulatory Visit: Payer: Medicare PPO | Admitting: Internal Medicine

## 2021-04-02 DIAGNOSIS — M79604 Pain in right leg: Secondary | ICD-10-CM | POA: Diagnosis not present

## 2021-04-02 DIAGNOSIS — M545 Low back pain, unspecified: Secondary | ICD-10-CM

## 2021-04-02 DIAGNOSIS — M79605 Pain in left leg: Secondary | ICD-10-CM

## 2021-04-02 DIAGNOSIS — F339 Major depressive disorder, recurrent, unspecified: Secondary | ICD-10-CM | POA: Diagnosis not present

## 2021-04-02 DIAGNOSIS — Z7901 Long term (current) use of anticoagulants: Secondary | ICD-10-CM | POA: Diagnosis not present

## 2021-04-02 DIAGNOSIS — I10 Essential (primary) hypertension: Secondary | ICD-10-CM | POA: Diagnosis not present

## 2021-04-02 DIAGNOSIS — I2510 Atherosclerotic heart disease of native coronary artery without angina pectoris: Secondary | ICD-10-CM | POA: Diagnosis not present

## 2021-04-02 NOTE — Assessment & Plan Note (Signed)
No angina 

## 2021-04-02 NOTE — Assessment & Plan Note (Signed)
On Xarelto 

## 2021-04-02 NOTE — Patient Instructions (Signed)
[place]

## 2021-04-02 NOTE — Assessment & Plan Note (Signed)
Norco prn  Potential benefits of a long term opioids use as well as potential risks (i.e. addiction risk, apnea etc) and complications (i.e. Somnolence, constipation and others) were explained to the patient and were aknowledged. 

## 2021-04-02 NOTE — Assessment & Plan Note (Addendum)
Pt lost wt on diet Starting a PREP program

## 2021-04-02 NOTE — Assessment & Plan Note (Signed)
Losartan 

## 2021-04-02 NOTE — Progress Notes (Signed)
Subjective:  Patient ID: [name], male    DOB: [birth year]  Age: 75 y.o. MRN: [id number]  CC: Follow-up (3 month f/u)   HPI EDRIS FRIEDT presents for LBP, HTN, dyslipidemia f/u  Outpatient Medications Prior to Visit  Medication Sig Dispense Refill   acetaminophen (TYLENOL) 500 MG tablet Take 1,000 mg by mouth daily as needed for moderate pain.     alfuzosin (UROXATRAL) 10 MG 24 hr tablet TAKE 1 TABLET AT BEDTIME 90 tablet 3   amoxicillin (AMOXIL) 500 MG capsule Take 2,000 mg by mouth See admin instructions. Take 4 capsules (2000 mg) by mouth 1 hour prior to dental work     aspirin EC 81 MG tablet Take 81 mg by mouth daily.     BELSOMRA 15 MG TABS TAKE 1 TABLET BY MOUTH  DAILY (Patient taking differently: Take 1 tablet by mouth at bedtime as needed.) 90 tablet 1   buPROPion (WELLBUTRIN XL) 300 MG 24 hr tablet TAKE 1 TABLET EVERY DAY WITH BREAKFAST 90 tablet 3   Calcium Citrate-Vitamin D (CALCIUM CITRATE + D PO) Take 1 tablet by mouth 2 (two) times daily.      Cholecalciferol (VITAMIN D-3 PO) Take 2,000 Units by mouth 2 (two) times daily.      clonazePAM (KLONOPIN) 1 MG tablet Take 1 tablet (1 mg total) by mouth every 6 (six) hours as needed. for anxiety 360 tablet 1   denosumab (PROLIA) 60 MG/ML SOSY injection Inject 60 mg as directed every 6 (six) months. Last injection: Feb 2020 Next injection: August 2020     diltiazem (CARDIZEM CD) 240 MG 24 hr capsule TAKE 1 CAPSULE EVERY DAY 90 capsule 3   DULoxetine (CYMBALTA) 60 MG capsule TAKE 1 CAPSULE TWICE DAILY 180 capsule 3   ezetimibe (ZETIA) 10 MG tablet TAKE 1 TABLET EVERY DAY. 90 tablet 3   ferrous sulfate 325 (65 FE) MG tablet Take 325 mg by mouth 2 (two) times daily with a meal.      hydrochlorothiazide (HYDRODIURIL) 12.5 MG tablet Take 1 tablet (12.5 mg total) by mouth daily. 90 tablet 3   HYDROcodone-acetaminophen (NORCO) 7.5-325 MG tablet Take 1 tablet by mouth every 6 (six) hours as needed for severe pain. 120 tablet 0    isosorbide mononitrate (IMDUR) 30 MG 24 hr tablet Take 1 tablet (30 mg total) by mouth daily. 90 tablet 3   levothyroxine (SYNTHROID) 200 MCG tablet TAKE 1 TABLET EVERY DAY AT 6AM 90 tablet 3   loratadine (CLARITIN) 10 MG tablet Take 10 mg by mouth daily.      losartan (COZAAR) 50 MG tablet TAKE 1 TABLET EVERY DAY 90 tablet 2   Magnesium 500 MG CAPS Take 1 capsule (500 mg total) by mouth daily. (Patient taking differently: Take 500 mg by mouth every evening.) 30 capsule 1   metoprolol tartrate (LOPRESSOR) 25 MG tablet Take 1 tablet (25 mg total) by mouth 2 (two) times daily. 180 tablet 3   mirabegron ER (MYRBETRIQ) 25 MG TB24 tablet Take 25 mg by mouth at bedtime.      Multiple Vitamin (MULTIVITAMIN WITH MINERALS) TABS tablet Take 1 tablet by mouth daily. Men's One-A-Day 50+     nitroGLYCERIN (NITROSTAT) 0.4 MG SL tablet Place 0.4 mg under the tongue every 5 (five) minutes as needed for chest pain. x3 doses as needed for chest pain     omeprazole (PRILOSEC) 40 MG capsule TAKE 1 CAPSULE EVERY DAY BEFORE BREAKFAST 90 capsule 2   Potassium  Citrate 15 MEQ (1620 MG) TBCR TAKE 1 TABLET TWICE DAILY 180 tablet 3   Probiotic Product (PROBIOTIC PO) Take 1 capsule by mouth daily with breakfast.     Risankizumab-rzaa (SKYRIZI PEN [place]) Inject into the skin.     testosterone enanthate (DELATESTRYL) 200 MG/ML injection INJECT 1ML (200MG ) INTO THE MUSCLE EVERY 14 DAYS. DISCARD VIAL AFTER 28 DAYS. 5 mL 3   tiZANidine (ZANAFLEX) 4 MG tablet TAKE 1 TABLET EVERY 6 HOURS AS NEEDED FOR MUSCLE SPASM(S) 360 tablet 1   vitamin C (ASCORBIC ACID) 500 MG tablet Take 500 mg by mouth daily.     XARELTO 10 MG TABS tablet TAKE 1 TABLET EVERY DAY 90 tablet 1   ipratropium (ATROVENT) 0.03 % nasal spray as needed.     No facility-administered medications prior to visit.    ROS: Review of Systems  Constitutional:  Negative for appetite change, fatigue and unexpected weight change.  HENT:  Negative for congestion, nosebleeds,  sneezing, sore throat and trouble swallowing.   Eyes:  Negative for itching and visual disturbance.  Respiratory:  Negative for cough.   Cardiovascular:  Negative for chest pain, palpitations and leg swelling.  Gastrointestinal:  Negative for abdominal distention, blood in stool, diarrhea and nausea.  Genitourinary:  Negative for frequency and hematuria.  Musculoskeletal:  Positive for arthralgias, back pain and gait problem. Negative for joint swelling and neck pain.  Skin:  Negative for rash.  Neurological:  Negative for dizziness, tremors, speech difficulty and weakness.  Psychiatric/Behavioral:  Negative for agitation, dysphoric mood, sleep disturbance and suicidal ideas. The patient is not nervous/anxious.    Objective:  BP 138/82 (BP Location: Left Arm)   Pulse 81   Temp 98.2 F (36.8 C) (Oral)   Ht 6\' 3"  (1.905 m)   Wt (!) 308 lb 12.8 oz (140.1 kg)   SpO2 95%   BMI 38.60 kg/m   BP Readings from Last 3 Encounters:  04/02/21 138/82  04/01/21 112/60  03/26/21 (!) 142/80    Wt Readings from Last 3 Encounters:  04/02/21 (!) 308 lb 12.8 oz (140.1 kg)  04/01/21 (!) 311 lb 9.6 oz (141.3 kg)  03/26/21 (!) 307 lb 9.6 oz (139.5 kg)    Physical Exam Constitutional:      General: He is not in acute distress.    Appearance: He is well-developed. He is obese.     Comments: NAD  Eyes:     Conjunctiva/sclera: Conjunctivae normal.     Pupils: Pupils are equal, round, and reactive to light.  Neck:     Thyroid: No thyromegaly.     Vascular: No JVD.  Cardiovascular:     Rate and Rhythm: Normal rate and regular rhythm.     Heart sounds: Normal heart sounds. No murmur heard.   No friction rub. No gallop.  Pulmonary:     Effort: Pulmonary effort is normal. No respiratory distress.     Breath sounds: Normal breath sounds. No wheezing or rales.  Chest:     Chest wall: No tenderness.  Abdominal:     General: Bowel sounds are normal. There is no distension.     Palpations: Abdomen  is soft. There is no mass.     Tenderness: There is no abdominal tenderness. There is no guarding or rebound.  Musculoskeletal:        General: No tenderness. Normal range of motion.     Cervical back: Normal range of motion.  Lymphadenopathy:     Cervical: No cervical adenopathy.  Skin:    General: Skin is warm and dry.     Findings: No rash.  Neurological:     Mental Status: He is alert and oriented to person, place, and time.     Cranial Nerves: No cranial nerve deficit.     Motor: No abnormal muscle tone.     Coordination: Coordination normal.     Gait: Gait normal.     Deep Tendon Reflexes: Reflexes are normal and symmetric.  Psychiatric:        Behavior: Behavior normal.        Thought Content: Thought content normal.        Judgment: Judgment normal.   Using a walker Lab Results  Component Value Date   WBC 7.2 12/08/2020   HGB 16.6 12/08/2020   HCT 51.3 12/08/2020   PLT 158 12/08/2020   GLUCOSE 129 (H) 02/24/2021   CHOL 101 02/24/2021   TRIG 91 02/24/2021   HDL 47 02/24/2021   LDLCALC 36 02/24/2021   ALT 29 02/24/2021   AST 26 02/24/2021   NA 138 02/24/2021   K 4.7 02/24/2021   CL 102 02/24/2021   CREATININE 1.37 (H) 02/24/2021   BUN 31 (H) 02/24/2021   CO2 29 02/24/2021   TSH 0.02 Repeated and verified X2. (L) 07/08/2020   PSA 1.08 06/26/2020   INR 1.0 03/26/2019   HGBA1C 5.8 (H) 02/06/2013    No results found.  Assessment & Plan:     Walker Kehr, MD

## 2021-04-02 NOTE — Assessment & Plan Note (Signed)
On Cymbalta 

## 2021-04-07 NOTE — Progress Notes (Signed)
[facility] PREP Weekly Session   Patient Details  Name: [name] MRN: [id number] Date of Birth: 05/29/1946 Age: 75 y.o. PCP: Cassandria Anger, MD  Vitals:   04/07/21 1449  Weight: (!) 312 lb (141.5 kg)     [facility] Weekly seesion - 04/07/21 1400       Weekly Session   Topic Discussed Importance of resistance training;Other ways to be active    Minutes exercised this week 20 minutes    Classes attended to date [place] 04/07/2021, 2:50 PM

## 2021-04-08 DIAGNOSIS — J301 Allergic rhinitis due to pollen: Secondary | ICD-10-CM | POA: Diagnosis not present

## 2021-04-08 DIAGNOSIS — J3089 Other allergic rhinitis: Secondary | ICD-10-CM | POA: Diagnosis not present

## 2021-04-14 NOTE — Progress Notes (Signed)
[facility] PREP Weekly Session   Patient Details  Name: [name] MRN: [id number] Date of Birth: March 19, 1946 Age: 75 y.o. PCP: Cassandria Anger, MD  Vitals:   04/14/21 1433  Weight: (!) 312 lb (141.5 kg)     [facility] Weekly seesion - 04/14/21 1400       Weekly Session   Topic Discussed Healthy eating tips   sugar demo   Classes attended to date [place] 04/14/2021, 2:34 PM

## 2021-04-20 DIAGNOSIS — R3915 Urgency of urination: Secondary | ICD-10-CM | POA: Diagnosis not present

## 2021-04-20 DIAGNOSIS — R350 Frequency of micturition: Secondary | ICD-10-CM | POA: Diagnosis not present

## 2021-04-20 DIAGNOSIS — N3941 Urge incontinence: Secondary | ICD-10-CM | POA: Diagnosis not present

## 2021-04-21 NOTE — Progress Notes (Signed)
[facility] PREP Weekly Session   Patient Details  Name: [name] MRN: [id number] Date of Birth: [birth year] Age: 74 y.o. PCP: Cassandria Anger, MD  Vitals:   04/21/21 1355  Weight: (!) 310 lb (140.6 kg)     [facility] Weekly seesion - 04/21/21 1300       Weekly Session   Topic Discussed Health habits    Minutes exercised this week 60 minutes    Classes attended to date [place] 04/21/2021, 1:56 PM

## 2021-04-22 DIAGNOSIS — F410 Panic disorder [episodic paroxysmal anxiety] without agoraphobia: Secondary | ICD-10-CM | POA: Diagnosis not present

## 2021-04-22 DIAGNOSIS — F331 Major depressive disorder, recurrent, moderate: Secondary | ICD-10-CM | POA: Diagnosis not present

## 2021-04-23 DIAGNOSIS — G4733 Obstructive sleep apnea (adult) (pediatric): Secondary | ICD-10-CM | POA: Diagnosis not present

## 2021-04-28 NOTE — Progress Notes (Signed)
[facility] PREP Weekly Session   Patient Details  Name: STAN CANTAVE MRN: [id number] Date of Birth: 1945-12-31 Age: 75 y.o. PCP: Cassandria Anger, MD  Vitals:   04/28/21 1402  Weight: (!) 310 lb (140.6 kg)     [facility] Weekly seesion - 04/28/21 1400       Weekly Session   Topic Discussed Restaurant Eating   salt demo; reviewed food/nutrition labels brought in by each member for ingredients, sodium, serving size, added sugars to determine nutritional value of that food/product   Minutes exercised this week 120 minutes    Classes attended to date [place] 04/28/2021, 2:03 PM

## 2021-05-05 ENCOUNTER — Encounter: Payer: Self-pay | Admitting: Hematology

## 2021-05-05 NOTE — Progress Notes (Signed)
[facility] PREP Weekly Session   Patient Details  Name: [name] MRN: [id number] Date of Birth: [birth year] Age: 75 y.o. PCP: Plotnikov, Evie Lacks, MD  Vitals:   05/05/21 1438  Weight: (!) 308 lb (139.7 kg)     [facility] Weekly seesion - 05/05/21 1400       Weekly Session   Topic Discussed Stress management and problem solving    Minutes exercised this week 120 minutes    Classes attended to date [place] 05/05/2021, 2:40 PM

## 2021-05-12 DIAGNOSIS — J4520 Mild intermittent asthma, uncomplicated: Secondary | ICD-10-CM | POA: Diagnosis not present

## 2021-05-12 DIAGNOSIS — J3089 Other allergic rhinitis: Secondary | ICD-10-CM | POA: Diagnosis not present

## 2021-05-12 DIAGNOSIS — L309 Dermatitis, unspecified: Secondary | ICD-10-CM | POA: Diagnosis not present

## 2021-05-12 DIAGNOSIS — J301 Allergic rhinitis due to pollen: Secondary | ICD-10-CM | POA: Diagnosis not present

## 2021-05-12 DIAGNOSIS — J300 Vasomotor rhinitis: Secondary | ICD-10-CM | POA: Diagnosis not present

## 2021-05-12 NOTE — Progress Notes (Signed)
[facility] PREP Weekly Session   Patient Details  Name: [name] MRN: [id number] Date of Birth: [birth year] Age: 75 y.o. PCP: Cassandria Anger, MD  Vitals:   05/12/21 1428  Weight: (!) 308 lb (139.7 kg)     [facility] Weekly seesion - 05/12/21 1400       Weekly Session   Topic Discussed Expectations and non-scale victories    Minutes exercised this week 80 minutes    Classes attended to date [place] 05/12/2021, 2:29 PM

## 2021-05-13 DIAGNOSIS — L570 Actinic keratosis: Secondary | ICD-10-CM | POA: Diagnosis not present

## 2021-05-13 DIAGNOSIS — D485 Neoplasm of uncertain behavior of skin: Secondary | ICD-10-CM | POA: Diagnosis not present

## 2021-05-13 DIAGNOSIS — Z85828 Personal history of other malignant neoplasm of skin: Secondary | ICD-10-CM | POA: Diagnosis not present

## 2021-05-13 DIAGNOSIS — L400 Psoriasis vulgaris: Secondary | ICD-10-CM | POA: Diagnosis not present

## 2021-05-13 DIAGNOSIS — D1801 Hemangioma of skin and subcutaneous tissue: Secondary | ICD-10-CM | POA: Diagnosis not present

## 2021-05-13 DIAGNOSIS — L821 Other seborrheic keratosis: Secondary | ICD-10-CM | POA: Diagnosis not present

## 2021-05-14 DIAGNOSIS — J301 Allergic rhinitis due to pollen: Secondary | ICD-10-CM | POA: Diagnosis not present

## 2021-05-18 DIAGNOSIS — J301 Allergic rhinitis due to pollen: Secondary | ICD-10-CM | POA: Diagnosis not present

## 2021-05-18 DIAGNOSIS — J3089 Other allergic rhinitis: Secondary | ICD-10-CM | POA: Diagnosis not present

## 2021-05-19 ENCOUNTER — Emergency Department (HOSPITAL_BASED_OUTPATIENT_CLINIC_OR_DEPARTMENT_OTHER)
Admission: EM | Admit: 2021-05-19 | Discharge: 2021-05-19 | Disposition: A | Discharge status: Home / Self Care | Payer: Medicare PPO | Attending: Emergency Medicine | Admitting: Emergency Medicine

## 2021-05-19 ENCOUNTER — Emergency Department (HOSPITAL_BASED_OUTPATIENT_CLINIC_OR_DEPARTMENT_OTHER): Payer: Medicare PPO

## 2021-05-19 ENCOUNTER — Encounter (HOSPITAL_BASED_OUTPATIENT_CLINIC_OR_DEPARTMENT_OTHER): Payer: Self-pay

## 2021-05-19 ENCOUNTER — Other Ambulatory Visit: Payer: Self-pay

## 2021-05-19 DIAGNOSIS — I517 Cardiomegaly: Secondary | ICD-10-CM | POA: Diagnosis not present

## 2021-05-19 DIAGNOSIS — Z8572 Personal history of non-Hodgkin lymphomas: Secondary | ICD-10-CM | POA: Diagnosis not present

## 2021-05-19 DIAGNOSIS — Z96641 Presence of right artificial hip joint: Secondary | ICD-10-CM | POA: Diagnosis not present

## 2021-05-19 DIAGNOSIS — Z20822 Contact with and (suspected) exposure to covid-19: Secondary | ICD-10-CM | POA: Diagnosis not present

## 2021-05-19 DIAGNOSIS — I2510 Atherosclerotic heart disease of native coronary artery without angina pectoris: Secondary | ICD-10-CM | POA: Insufficient documentation

## 2021-05-19 DIAGNOSIS — R600 Localized edema: Secondary | ICD-10-CM | POA: Insufficient documentation

## 2021-05-19 DIAGNOSIS — J441 Chronic obstructive pulmonary disease with (acute) exacerbation: Secondary | ICD-10-CM | POA: Diagnosis not present

## 2021-05-19 DIAGNOSIS — I110 Hypertensive heart disease with heart failure: Secondary | ICD-10-CM | POA: Insufficient documentation

## 2021-05-19 DIAGNOSIS — Z96653 Presence of artificial knee joint, bilateral: Secondary | ICD-10-CM | POA: Insufficient documentation

## 2021-05-19 DIAGNOSIS — E039 Hypothyroidism, unspecified: Secondary | ICD-10-CM | POA: Insufficient documentation

## 2021-05-19 DIAGNOSIS — I5032 Chronic diastolic (congestive) heart failure: Secondary | ICD-10-CM | POA: Diagnosis not present

## 2021-05-19 DIAGNOSIS — Z87891 Personal history of nicotine dependence: Secondary | ICD-10-CM | POA: Diagnosis not present

## 2021-05-19 DIAGNOSIS — Z7982 Long term (current) use of aspirin: Secondary | ICD-10-CM | POA: Insufficient documentation

## 2021-05-19 DIAGNOSIS — Z79899 Other long term (current) drug therapy: Secondary | ICD-10-CM | POA: Insufficient documentation

## 2021-05-19 DIAGNOSIS — R0602 Shortness of breath: Secondary | ICD-10-CM | POA: Diagnosis not present

## 2021-05-19 DIAGNOSIS — R0600 Dyspnea, unspecified: Secondary | ICD-10-CM | POA: Diagnosis not present

## 2021-05-19 DIAGNOSIS — Z7901 Long term (current) use of anticoagulants: Secondary | ICD-10-CM | POA: Diagnosis not present

## 2021-05-19 DIAGNOSIS — R059 Cough, unspecified: Secondary | ICD-10-CM | POA: Diagnosis present

## 2021-05-19 DIAGNOSIS — I4891 Unspecified atrial fibrillation: Secondary | ICD-10-CM | POA: Insufficient documentation

## 2021-05-19 LAB — CBC WITH DIFFERENTIAL/PLATELET
Abs Immature Granulocytes: 0.05 10*3/uL (ref 0.00–0.07)
Basophils Absolute: 0 10*3/uL (ref 0.0–0.1)
Basophils Relative: 0 %
Eosinophils Absolute: 0.2 10*3/uL (ref 0.0–0.5)
Eosinophils Relative: 2 %
HCT: 44.7 % (ref 39.0–52.0)
Hemoglobin: 14.8 g/dL (ref 13.0–17.0)
Immature Granulocytes: 1 %
Lymphocytes Relative: 19 %
Lymphs Abs: 1.7 10*3/uL (ref 0.7–4.0)
MCH: 31 pg (ref 26.0–34.0)
MCHC: 33.1 g/dL (ref 30.0–36.0)
MCV: 93.7 fL (ref 80.0–100.0)
Monocytes Absolute: 0.9 10*3/uL (ref 0.1–1.0)
Monocytes Relative: 10 %
Neutro Abs: 6 10*3/uL (ref 1.7–7.7)
Neutrophils Relative %: 68 %
Platelets: 134 10*3/uL — ABNORMAL LOW (ref 150–400)
RBC: 4.77 MIL/uL (ref 4.22–5.81)
RDW: 14.7 % (ref 11.5–15.5)
WBC: 8.8 10*3/uL (ref 4.0–10.5)
nRBC: 0 % (ref 0.0–0.2)

## 2021-05-19 LAB — BASIC METABOLIC PANEL
Anion gap: 10 (ref 5–15)
BUN: 33 mg/dL — ABNORMAL HIGH (ref 8–23)
CO2: 28 mmol/L (ref 22–32)
Calcium: 9 mg/dL (ref 8.9–10.3)
Chloride: 99 mmol/L (ref 98–111)
Creatinine, Ser: 1.44 mg/dL — ABNORMAL HIGH (ref 0.61–1.24)
GFR, Estimated: 51 mL/min — ABNORMAL LOW (ref >60)
Glucose, Bld: 99 mg/dL (ref 70–99)
Potassium: 4.1 mmol/L (ref 3.5–5.1)
Sodium: 137 mmol/L (ref 135–145)

## 2021-05-19 LAB — BRAIN NATRIURETIC PEPTIDE: B Natriuretic Peptide: 124.1 pg/mL — ABNORMAL HIGH (ref 0.0–100.0)

## 2021-05-19 LAB — RESP PANEL BY RT-PCR (FLU A&B, COVID) ARPGX2
Influenza A by PCR: NEGATIVE
Influenza B by PCR: NEGATIVE
SARS Coronavirus 2 by RT PCR: NEGATIVE

## 2021-05-19 MED ORDER — BENZONATATE 100 MG PO CAPS: 100.0000 mg | ORAL_CAPSULE | Freq: Three times a day (TID) | ORAL | 0 refills | Status: DC

## 2021-05-19 MED ORDER — PREDNISONE 50 MG PO TABS
60.0000 mg | ORAL_TABLET | Freq: Once | ORAL | Status: AC
  Administered 2021-05-19: 60 mg via ORAL
  Filled 2021-05-19: qty 1

## 2021-05-19 MED ORDER — ALBUTEROL SULFATE (2.5 MG/3ML) 0.083% IN NEBU
2.5000 mg | INHALATION_SOLUTION | Freq: Once | RESPIRATORY_TRACT | Status: AC
  Administered 2021-05-19: 2.5 mg via RESPIRATORY_TRACT
  Filled 2021-05-19: qty 3

## 2021-05-19 MED ORDER — IPRATROPIUM-ALBUTEROL 0.5-2.5 (3) MG/3ML IN SOLN
3.0000 mL | Freq: Once | RESPIRATORY_TRACT | Status: AC
  Administered 2021-05-19: 3 mL via RESPIRATORY_TRACT
  Filled 2021-05-19: qty 3

## 2021-05-19 MED ORDER — PREDNISONE 20 MG PO TABS: 60.0000 mg | ORAL_TABLET | Freq: Every day | ORAL | 0 refills | Status: AC

## 2021-05-19 MED ORDER — DOXYCYCLINE HYCLATE 100 MG PO CAPS: 100.0000 mg | ORAL_CAPSULE | Freq: Two times a day (BID) | ORAL | 0 refills | Status: AC

## 2021-05-19 NOTE — ED Provider Notes (Signed)
[place] EMERGENCY DEPT Provider Note   CSN: [id number] Arrival date & time: 05/19/21  [id number]     History Chief Complaint  Patient presents with   Shortness of Breath   Cough    [name] is a 75 y.o. male.  The history is provided by the patient.  Cough Cough characteristics:  Productive Sputum characteristics:  White Severity:  Moderate Onset quality:  Gradual Duration:  4 days Timing:  Intermittent Progression:  Waxing and waning Chronicity:  New Smoker: no (former)   Context: not sick contacts   Context comment:  Has seasonal allergies, on treatment Relieved by: cough drops (partial relief) Worsened by:  Nothing Ineffective treatments: Has a rescue inhaler but does not use it. Associated symptoms: chest pain (when coughing) and shortness of breath (at times)   Associated symptoms: no chills, no ear pain, no fever, no myalgias, no rash and no sore throat       Past Medical History:  Diagnosis Date   Anticoagulant long-term use    xarelto--- takes for AFib,  managed by pcp, Dr Alain Marion   Benign localized prostatic hyperplasia with lower urinary tract symptoms (LUTS)    CAD (coronary artery disease) cardiologist--  dr t. Oval Linsey   hx of known CAD obstruction w/ collaterals (cath done @ [facility] 12-31-2011) ;   last cardiac cath 08-13-2019  showed sig. 2V CAD involving proxLAD and CTO of the RCA/  chronic total occlusion midRCA w/ bridging and L>R collaterals   Diastolic CHF ([place])    dx [id number] hospital admission (followed by pcp)   Diffuse large B cell lymphoma [facility]) oncologist-- dr Irene Limbo--- in remission   dx 08/ 2019 -- bx left tonsill mass-- involving lymph nodes--- completed chemotherapy 10-12-2018   DJD (degenerative joint disease)    Dysthymic disorder    Elevated brain natriuretic peptide (BNP) level    Environmental allergies    GAD (generalized anxiety disorder)    GERD (gastroesophageal reflux disease)    History of falling     multiple times   History of Graves' disease    1987 s/p total thyroidectomy   History of kidney stones    History of scabies    07/ 2014  resolved   History of syncope    per D/C note in epic 02-21-2013 ?vasovagal   History of vertebral compression fracture    05/ 2014 --  L1, no surgical intervention   HLD (hyperlipidemia)    Hyperlipidemia    Hypertension    Hypogonadism in male    IDA (iron deficiency anemia)    Lumbago    OA (osteoarthritis)    OSA on CPAP    cpap machine-settings 17   Osteoporosis    Severe   Persistent atrial fibrillation [facility])    cardiologist--- dr Alfonso Patten. [place]   Plaque psoriasis    dermatologist--- dr Harriett Sine--- currently taking otezla   Post-surgical hypothyroidism    followed by pcp---  s/p total thyroidectomy for graves disease in 1987   Pure hypercholesterolemia 02/10/2021   Renal calculus, right    Thrombocytopenia ([place])    Unsteady gait    uses walker   Wears hearing aid in both ears     Patient Active Problem List   Diagnosis Date Noted   Pure hypercholesterolemia 02/10/2021   Chronic diastolic heart failure ([place]) 06/21/2019   H/O nephrolithotomy with removal of calculi 03/26/2019   Renal calculus 03/26/2019   Generalized weakness 11/08/2018   Well adult exam 10/02/2018  Counseling regarding advance care planning and goals of care 07/09/2018   Diffuse large B-cell lymphoma ([place]) 06/23/2018   Port-A-Cath in place 06/23/2018   Hypogonadism in male 11/22/2017   Nephrolithiasis 10/24/2017   Recurrent dislocation of left patella 07/15/2017   Recurrent subluxation of patella, left 07/08/2017   S/P revision of total knee 04/11/2017   Failed total knee, left, initial encounter ([place]) 04/09/2017   Renal calculus, right 02/08/2017   Protein-calorie malnutrition, severe ([place]) 12/08/2016   Chronic obstructive pulmonary disease ([place]) 12/08/2016   [place] renal calculus 11/05/2016   Wound drainage right hip 11/15/2013   Expected blood  loss anemia 09/18/2013   Hyponatremia 09/18/2013   S/P right TH revision 09/17/2013   Nonunion, fracture, Right tibia  07/03/2013   UTI (urinary tract infection) 07/03/2013   Pathological fracture due to osteoporosis with delayed healing 04/24/2013   Osteoporosis with fracture 04/24/2013   Rash and nonspecific skin eruption 03/08/2013   Syncope 02/21/2013   Chronic anticoagulation 02/21/2013   Fall 02/08/2013   Physical deconditioning 02/08/2013   Low back pain radiating to both legs 02/08/2013   Compression fracture of L1 lumbar vertebra ([place]) 02/08/2013   Right tibial fracture 02/05/2013   Morbid obesity ([place]) 02/05/2013   Osteomyelitis of right wrist ([place]) 11/02/2012   Anemia 10/06/2012   Essential hypertension 10/03/2012   Psoriasis 10/03/2012   Vitamin D deficiency 10/03/2012   GERD (gastroesophageal reflux disease) 10/03/2012   Hyperglycemia 10/03/2012   Anxiety 10/03/2012   Depression 10/03/2012   CAD (coronary artery disease) 10/03/2012   DJD (degenerative joint disease) 10/03/2012   Chronic gingivitis 10/03/2012   Low testosterone, possible hypogonadism 10/01/2012   Normocytic anemia 09/30/2012   Right femoral fracture ([place]) 09/29/2012   Right forearm fracture 09/29/2012   Longstanding persistent atrial fibrillation ([place]) 09/29/2012   Hypothyroidism 09/29/2012   History of recurrent UTIs 09/29/2012    Past Surgical History:  Procedure Laterality Date   APPENDECTOMY  2011 approx.    CARDIAC CATHETERIZATION  12-31-2011   dr j. Beatrix Fetters '@[facility]'$    normal LVF w/ multivessel CAD,  occluded RCA w/ colleterals   CATARACT EXTRACTION W/ INTRAOCULAR LENS  IMPLANT, BILATERAL  2016 approx.   COLONOSCOPY     CYSTOSCOPY WITH RETROGRADE PYELOGRAM, URETEROSCOPY AND STENT PLACEMENT Left 11/07/2017   Procedure: CYSTOSCOPY WITH RETROGRADE PYELOGRAM, URETEROSCOPY AND STENT PLACEMENT;  Surgeon: Cleon Gustin, MD;  Location[facility];  Service: Urology;  Laterality: Left;   CYSTOSCOPY  WITH RETROGRADE PYELOGRAM, URETEROSCOPY AND STENT PLACEMENT Right 10/22/2019   Procedure: CYSTOSCOPY WITH RETROGRADE PYELOGRAM, URETEROSCOPY AND STENT PLACEMENT;  Surgeon: Cleon Gustin, MD;  Location: [facility];  Service: Urology;  Laterality: Right;   CYSTOSCOPY/URETEROSCOPY/HOLMIUM LASER/STENT PLACEMENT Right 03/11/2017   Procedure: CYSTOSCOPY/URETEROSCOPYSTENT PLACEMENT right ureter retrograde pylegram;  Surgeon: Cleon Gustin, MD;  [facility];  Service: Urology;  Laterality: Right;   HARDWARE REMOVAL  10/05/2012   Procedure: HARDWARE REMOVAL;  Surgeon: [name], MD;  [facility];  Service: Orthopedics;  Laterality: Right;  REMOVING  STRYKER  GAMMA NAIL   HARDWARE REMOVAL Right 07/03/2013   Procedure: HARDWARE REMOVAL RIGHT TIBIA ;  Surgeon: Rozanna Box, MD;  Location: [place];  Service: Orthopedics;  Laterality: Right;   HIP CLOSED REDUCTION Right 10/15/2013   Procedure: CLOSED MANIPULATION HIP;  Surgeon: [name], MD;  [facility];  Service: Orthopedics;  Laterality: Right;   HOLMIUM LASER APPLICATION Right [id number]   Procedure: HOLMIUM LASER APPLICATION;  Surgeon:  Cleon Gustin, MD;  [facility];  Service: Urology;  Laterality: Right;   HOLMIUM LASER APPLICATION Left [id number]   Procedure: HOLMIUM LASER APPLICATION;  Surgeon: Cleon Gustin, MD;  [facility];  Service: Urology;  Laterality: Left;   HOLMIUM LASER APPLICATION Left [id number]   Procedure: HOLMIUM LASER APPLICATION;  Surgeon: Cleon Gustin, MD;  [facility];  Service: Urology;  Laterality: Left;   HOLMIUM LASER APPLICATION Right [id number]   Procedure: HOLMIUM LASER APPLICATION;  Surgeon: Cleon Gustin, MD;  Location: [facility];  Service: Urology;  Laterality: Right;   INCISION AND DRAINAGE HIP Right 11/16/2013   Procedure: IRRIGATION AND DEBRIDEMENT RIGHT HIP;  Surgeon: [name], MD;  [facility];  Service:  Orthopedics;  Laterality: Right;   INTRAVASCULAR PRESSURE WIRE/FFR STUDY N/A 08/13/2019   Procedure: INTRAVASCULAR PRESSURE WIRE/FFR STUDY;  Surgeon: Nelva Bush, MD;  Location: [place] [facility];  Service: Cardiovascular;  Laterality: N/A;   IR IMAGING GUIDED PORT INSERTION  06/22/2018   IR NEPHROSTOMY PLACEMENT LEFT  03/28/2019   IR URETERAL STENT LEFT NEW ACCESS W/O SEP NEPHROSTOMY CATH  10/24/2017   IR URETERAL STENT LEFT NEW ACCESS W/O SEP NEPHROSTOMY CATH  03/26/2019   NEPHROLITHOTOMY Right 02/08/2017   Procedure: NEPHROLITHOTOMY PERCUTANEOUS WITH SURGEON ACCESS;  Surgeon: Cleon Gustin, MD;  [facility];  Service: Urology;  Laterality: Right;   NEPHROLITHOTOMY Left 10/24/2017   Procedure: NEPHROLITHOTOMY PERCUTANEOUS;  Surgeon: Cleon Gustin, MD;  [facility];  Service: Urology;  Laterality: Left;   NEPHROLITHOTOMY Left 03/26/2019   Procedure: NEPHROLITHOTOMY PERCUTANEOUS;  Surgeon: Cleon Gustin, MD;  [facility];  Service: Urology;  Laterality: Left;  2 HRS   NEPHROLITHOTOMY Left 04/17/2019   Procedure: NEPHROLITHOTOMY PERCUTANEOUS;  Surgeon: Cleon Gustin, MD;  [facility];  Service: Urology;  Laterality: Left;  2 HRS   ORIF TIBIA FRACTURE Right 02/06/2013   Procedure: OPEN REDUCTION INTERNAL FIXATION (ORIF) TIBIA FRACTURE WITH IM ROD FIBULA;  Surgeon: Rozanna Box, MD;  Location: [place];  Service: Orthopedics;  Laterality: Right;   ORIF TIBIA FRACTURE Right 07/03/2013   Procedure: RIGHT TIBIA NON UNION REPAIR ;  Surgeon: Rozanna Box, MD;  Location: [place];  Service: Orthopedics;  Laterality: Right;   ORIF WRIST FRACTURE  10/02/2012   Procedure: OPEN REDUCTION INTERNAL FIXATION (ORIF) WRIST FRACTURE;  Surgeon: Roseanne Kaufman, MD;  [facility];  Service: Orthopedics;  Laterality: Right;  WITH   ANTIBIOTIC  CEMENT   ORIF WRIST FRACTURE Left 10/28/2013   Procedure: OPEN REDUCTION INTERNAL FIXATION (ORIF) WRIST FRACTURE with allograft;   Surgeon: Roseanne Kaufman, MD;  [facility];  Service: Orthopedics;  Laterality: Left;  DVR Plate   QUADRICEPS TENDON REPAIR Left 07/15/2017   Procedure: REPAIR QUADRICEP TENDON;  Surgeon: Frederik Pear, MD;  Location: [place];  Service: Orthopedics;  Laterality: Left;   RIGHT/LEFT HEART CATH AND CORONARY ANGIOGRAPHY N/A 08/13/2019   Procedure: RIGHT/LEFT HEART CATH AND CORONARY ANGIOGRAPHY;  Surgeon: Nelva Bush, MD;  Location: [place] [facility];  Service: Cardiovascular;  Laterality: N/A;   THYROIDECTOMY  02/1986   TOTAL HIP ARTHROPLASTY Right 03-16-2016   '@[facility]'$    TOTAL HIP REVISION  10/05/2012   Procedure: TOTAL HIP REVISION;  Surgeon: [name], MD;  [facility];  Service: Orthopedics;  Laterality: Right;  RIGHT TOTAL HIP REVISION   TOTAL HIP REVISION Right 09/17/2013   Procedure: REVISION RIGHT TOTAL HIP ARTHROPLASTY ;  Surgeon: [name], MD;  [facility];  Service: Orthopedics;  Laterality: Right;   TOTAL HIP REVISION Right 10/26/2013   Procedure: REVISION RIGHT TOTAL HIP ARTHROPLASTY;  Surgeon: [name], MD;  [facility];  Service: Orthopedics;  Laterality: Right;   TOTAL KNEE ARTHROPLASTY Bilateral right 03-15-2011;  left 06-30-2011   TOTAL KNEE REVISION Left 04/11/2017   Procedure: TOTAL KNEE REVISION PATELLA and TIBIA;  Surgeon: Frederik Pear, MD;  Location: [place];  Service: Orthopedics;  Laterality: Left;       Family History  Problem Relation Age of Onset   CAD Father 3   Asthma Father    Alcohol abuse Father    Arthritis Mother    Alcohol abuse Sister     Social History   Tobacco Use   Smoking status: Former    Packs/day: 1.00    Years: 50.00    Pack years: 50.00    Types: Cigarettes    Quit date: 01/12/2012    Years since quitting: 9.3   Smokeless tobacco: Never  Vaping Use   Vaping Use: Never used  Substance Use Topics   Alcohol use: Not Currently    Comment: occasional-social   Drug use: Never    Home Medications Prior to  Admission medications   Medication Sig Start Date End Date Taking? Authorizing Provider  acetaminophen (TYLENOL) 500 MG tablet Take 1,000 mg by mouth daily as needed for moderate pain.    [provider]  alfuzosin (UROXATRAL) 10 MG 24 hr tablet TAKE 1 TABLET AT BEDTIME 03/19/21   McKenzie, Candee Furbish, MD  amoxicillin (AMOXIL) 500 MG capsule Take 2,000 mg by mouth See admin instructions. Take 4 capsules (2000 mg) by mouth 1 hour prior to dental work    [provider]  aspirin EC 81 MG tablet Take 81 mg by mouth daily.    [provider]  BELSOMRA 15 MG TABS TAKE 1 TABLET BY MOUTH  DAILY Patient taking differently: Take 1 tablet by mouth at bedtime as needed. 09/18/19   Plotnikov, Evie Lacks, MD  buPROPion (WELLBUTRIN XL) 300 MG 24 hr tablet TAKE 1 TABLET EVERY DAY WITH BREAKFAST 02/20/21   Plotnikov, Evie Lacks, MD  Calcium Citrate-Vitamin D (CALCIUM CITRATE + D PO) Take 1 tablet by mouth 2 (two) times daily.     [provider]  Cholecalciferol (VITAMIN D-3 PO) Take 2,000 Units by mouth 2 (two) times daily.     [provider]  clonazePAM (KLONOPIN) 1 MG tablet Take 1 tablet (1 mg total) by mouth every 6 (six) hours as needed. for anxiety 02/20/21   Plotnikov, Evie Lacks, MD  denosumab (PROLIA) 60 MG/ML SOSY injection Inject 60 mg as directed every 6 (six) months. Last injection: Feb 2020 Next injection: August 2020 12/10/15   [provider]  diltiazem (CARDIZEM CD) 240 MG 24 hr capsule TAKE 1 CAPSULE EVERY DAY 03/23/21   Plotnikov, Evie Lacks, MD  DULoxetine (CYMBALTA) 60 MG capsule TAKE 1 CAPSULE TWICE DAILY 03/23/21   Plotnikov, Evie Lacks, MD  ezetimibe (ZETIA) 10 MG tablet TAKE 1 TABLET EVERY DAY. 09/01/20   Skeet Latch, MD  ferrous sulfate 325 (65 FE) MG tablet Take 325 mg by mouth 2 (two) times daily with a meal.     [provider]  hydrochlorothiazide (HYDRODIURIL) 12.5 MG tablet Take 1 tablet (12.5 mg total) by mouth daily. 08/04/20    Skeet Latch, MD  HYDROcodone-acetaminophen [facility]) 7.5-325 MG tablet Take 1 tablet by  mouth every 6 (six) hours as needed for severe pain. 09/30/20   Plotnikov, Evie Lacks, MD  isosorbide mononitrate (IMDUR) 30 MG 24 hr tablet Take 1 tablet (30 mg total) by mouth daily. 08/04/20   Skeet Latch, MD  levothyroxine (SYNTHROID) 200 MCG tablet TAKE 1 TABLET EVERY DAY AT 6AM 03/23/21   Plotnikov, Evie Lacks, MD  loratadine (CLARITIN) 10 MG tablet Take 10 mg by mouth daily.     [provider]  losartan (COZAAR) 50 MG tablet TAKE 1 TABLET EVERY DAY 07/14/20   Skeet Latch, MD  Magnesium 500 MG CAPS Take 1 capsule (500 mg total) by mouth daily. Patient taking differently: Take 500 mg by mouth every evening. 10/13/18   Brunetta Genera, MD  metoprolol tartrate (LOPRESSOR) 25 MG tablet Take 1 tablet (25 mg total) by mouth 2 (two) times daily. 08/04/20   Skeet Latch, MD  mirabegron ER (MYRBETRIQ) 25 MG TB24 tablet Take 25 mg by mouth at bedtime.     [provider]  Multiple Vitamin (MULTIVITAMIN WITH MINERALS) TABS tablet Take 1 tablet by mouth daily. Men's One-A-Day 50+    [provider]  nitroGLYCERIN (NITROSTAT) 0.4 MG SL tablet Place 0.4 mg under the tongue every 5 (five) minutes as needed for chest pain. x3 doses as needed for chest pain    [provider]  omeprazole (PRILOSEC) 40 MG capsule TAKE 1 CAPSULE EVERY DAY BEFORE BREAKFAST 12/29/20   Plotnikov, Evie Lacks, MD  Potassium Citrate 15 MEQ (1620 MG) TBCR TAKE 1 TABLET TWICE DAILY 03/19/21   McKenzie, Candee Furbish, MD  Probiotic Product (PROBIOTIC PO) Take 1 capsule by mouth daily with breakfast.    [provider]  Risankizumab-rzaa ([place] PEN [place]) Inject into the skin.    [provider]  testosterone enanthate (DELATESTRYL) 200 MG/ML injection INJECT 1ML ('200MG'$ ) INTO THE MUSCLE EVERY 14 DAYS. DISCARD VIAL AFTER 28 DAYS. 12/31/20   Plotnikov, Evie Lacks, MD  tiZANidine (ZANAFLEX)  4 MG tablet TAKE 1 TABLET EVERY 6 HOURS AS NEEDED FOR MUSCLE SPASM(S) 11/25/20   Plotnikov, Evie Lacks, MD  vitamin C (ASCORBIC ACID) 500 MG tablet Take 500 mg by mouth daily.    [provider]  XARELTO 10 MG TABS tablet TAKE 1 TABLET EVERY DAY 11/25/20   Plotnikov, Evie Lacks, MD    Allergies    Short ragweed pollen ext  Review of Systems   Review of Systems  Constitutional:  Negative for chills and fever.  HENT:  Negative for ear pain and sore throat.   Eyes:  Negative for pain and visual disturbance.  Respiratory:  Positive for shortness of breath (at times). Negative for cough.   Cardiovascular:  Positive for chest pain (when coughing). Negative for palpitations.  Gastrointestinal:  Negative for abdominal pain and vomiting.  Genitourinary:  Negative for dysuria and hematuria.  Musculoskeletal:  Negative for arthralgias, back pain and myalgias.  Skin:  Negative for color change and rash.  Neurological:  Negative for seizures and syncope.  All other systems reviewed and are negative.  Physical Exam Updated Vital Signs BP 127/66 (BP Location: Left Arm)   Pulse 88   Temp 98.6 F (37 C) (Oral)   Resp 16   Ht '6\' 3"'$  (1.905 m)   Wt (!) 138.3 kg   SpO2 95%   BMI 38.12 kg/m   Physical Exam Vitals and nursing note reviewed.  Constitutional:      Appearance: Normal appearance.  HENT:     Head:  Normocephalic and atraumatic.  Cardiovascular:     Rate and Rhythm: Normal rate and regular rhythm.     Heart sounds: Normal heart sounds.  Pulmonary:     Effort: Pulmonary effort is normal.     Breath sounds: Examination of the right-upper field reveals wheezing. Examination of the left-upper field reveals wheezing. Examination of the right-middle field reveals wheezing. Examination of the left-middle field reveals wheezing. Examination of the right-lower field reveals wheezing. Examination of the left-lower field reveals wheezing. Wheezing present.  Abdominal:     General: There  is no distension.     Tenderness: There is no abdominal tenderness. There is no guarding.  Musculoskeletal:     Cervical back: Normal range of motion.     Right lower leg: Edema present.     Left lower leg: Edema present.     Comments: Mild, nonpitting edema to the ankles  Skin:    General: Skin is warm and dry.  Neurological:     General: No focal deficit present.     Mental Status: He is alert and oriented to person, place, and time.  Psychiatric:        Mood and Affect: Mood normal.        Behavior: Behavior normal.    ED Results / Procedures / Treatments   Labs (all labs ordered are listed, but only abnormal results are displayed) Labs Reviewed  BASIC METABOLIC PANEL - Abnormal; Notable for the following components:      Result Value   BUN 33 (*)    Creatinine, Ser 1.44 (*)    GFR, Estimated 51 (*)    All other components within normal limits  CBC WITH DIFFERENTIAL/PLATELET - Abnormal; Notable for the following components:   Platelets 134 (*)    All other components within normal limits  BRAIN NATRIURETIC PEPTIDE - Abnormal; Notable for the following components:   B Natriuretic Peptide 124.1 (*)    All other components within normal limits  RESP PANEL BY RT-PCR (FLU A&B, COVID) [id number]    EKG EKG Interpretation  Date/Time:  Tuesday May 19 2021 11:04:02 EDT Ventricular Rate:  92 PR Interval:    QRS Duration: 117 QT Interval:  374 QTC Calculation: 463 R Axis:   -41 Text Interpretation: Atrial fibrillation Nonspecific IVCD with LAD Anterior infarct, old No acute ischemia Confirmed by Lorre Munroe ([id number]) on 05/19/2021 11:10:59 AM  Radiology DG Chest Port 1 View  Result Date: 05/19/2021 CLINICAL DATA:  Dyspnea, history of CHF and shortness of breath EXAM: PORTABLE CHEST 1 VIEW COMPARISON:  Chest radiograph 06/15/2019 FINDINGS: A right-sided central venous catheter is in place terminating in the SVC. The heart is enlarged. The mediastinal contours are within normal  limits. There is vascular congestion with interstitial prominence suggesting pulmonary interstitial edema. There may be trace bilateral pleural effusions. There is no focal consolidation. There is no pneumothorax. There is a 1.0 cm nodular opacity projecting over the left upper lobe not seen on the prior study. There is no acute osseous abnormality. IMPRESSION: 1. Cardiomegaly with mild pulmonary interstitial edema and possible trace bilateral pleural effusions. 2. 1.0 cm nodular opacity projecting over the left upper lobe not seen on the prior study. This may be artifactual; however, recommend nonemergent CT chest to exclude a true parenchymal nodule Electronically Signed   By: Valetta Mole MD   On: 05/19/2021 09:55    Procedures Procedures   Medications Ordered in ED Medications  predniSONE (DELTASONE) tablet 60 mg (has no administration  in time range)  ipratropium-albuterol (DUONEB) 0.5-2.5 (3) MG/3ML nebulizer solution 3 mL (3 mLs Nebulization Given 05/19/21 0907)  albuterol (PROVENTIL) (2.5 MG/3ML) 0.083% nebulizer solution 2.5 mg (2.5 mg Nebulization Given 05/19/21 0907)    ED Course  I have reviewed the triage vital signs and the nursing notes.  Pertinent labs & imaging results that were available during my care of the patient were reviewed by me and considered in my medical decision making (see chart for details).    MDM Rules/Calculators/A&P                           ZELIG DOOLAN presents with several days of a cough.  He does have a history of COPD, and this is mild without frequent exacerbations.  He also has a history of CAD and CHF.  Differential diagnosis included pneumonia, COPD exacerbation, bronchitis, COVID-19, CHF exacerbation, ACS.  He is on anticoagulation, and I considered PE less likely than other etiologies of his symptoms.  ED evaluation was largely normal.  Chest x-ray showed possible mild pulmonary edema, but BNP was not elevated.  I recommended treatment with  steroids and doxycycline.  Final Clinical Impression(s) / ED Diagnoses Final diagnoses:  Chronic obstructive pulmonary disease with acute exacerbation ([place])    Rx / DC Orders ED Discharge Orders          Ordered    doxycycline (VIBRAMYCIN) 100 MG capsule  2 times daily        05/19/21 1145    predniSONE (DELTASONE) 20 MG tablet  Daily        05/19/21 1145    benzonatate (TESSALON) 100 MG capsule  Every 8 hours        05/19/21 1145             Arnaldo Natal, MD 05/19/21 1148

## 2021-05-19 NOTE — ED Triage Notes (Signed)
Pt reports history of congestive heart failure and shortness of breath.  Pt gets short of breath walking from ED waiting room to exam room.  Uses rescue inhaler when he becomes short of breath, but he does not know the name of the inhaler.  States he started coughing Friday night. States he is coughing up white, thick phlegm.  Home Covid test negative yesterday.  Denies any fevers.

## 2021-05-22 DIAGNOSIS — J3089 Other allergic rhinitis: Secondary | ICD-10-CM | POA: Diagnosis not present

## 2021-05-22 DIAGNOSIS — J301 Allergic rhinitis due to pollen: Secondary | ICD-10-CM | POA: Diagnosis not present

## 2021-05-26 ENCOUNTER — Other Ambulatory Visit: Payer: Self-pay | Admitting: Internal Medicine

## 2021-05-26 NOTE — Progress Notes (Signed)
[facility] [employer] PREP Weekly Session   Patient Details  Name: [name] MRN: [id number] Date of Birth: May 16, 1946 Age: 75 y.o. PCP: Cassandria Anger, MD  Vitals:   05/26/21 1414  Weight: (!) 306 lb (138.8 kg)     [facility] [employer] Weekly seesion - 05/26/21 1400       Weekly Session   Topic Discussed Finding support    Minutes exercised this week 50 minutes    Classes attended to date [place] 05/26/2021, 2:22 PM  05/26/2021, 2:21 PM

## 2021-05-27 DIAGNOSIS — M810 Age-related osteoporosis without current pathological fracture: Secondary | ICD-10-CM | POA: Diagnosis not present

## 2021-06-01 DIAGNOSIS — B351 Tinea unguium: Secondary | ICD-10-CM | POA: Diagnosis not present

## 2021-06-01 DIAGNOSIS — I739 Peripheral vascular disease, unspecified: Secondary | ICD-10-CM | POA: Diagnosis not present

## 2021-06-02 DIAGNOSIS — F410 Panic disorder [episodic paroxysmal anxiety] without agoraphobia: Secondary | ICD-10-CM | POA: Diagnosis not present

## 2021-06-02 DIAGNOSIS — F331 Major depressive disorder, recurrent, moderate: Secondary | ICD-10-CM | POA: Diagnosis not present

## 2021-06-02 NOTE — Progress Notes (Signed)
[facility] [employer] PREP Weekly Session   Patient Details  Name: JESEAN WICHERT MRN: [id number] Date of Birth: Aug 09, 1946 Age: 75 y.o. PCP: Plotnikov, Evie Lacks, MD  Vitals:   06/02/21 1646  Weight: 299 lb (135.6 kg)     [facility] [employer] Weekly seesion - 06/02/21 1600       Weekly Session   Topic Discussed Calorie breakdown   Fat/muscle comparison; [employer] membership talk given by Merrilee Seashore, Development worker, community   Minutes exercised this week 210 minutes    Classes attended to date [place] 06/02/2021, 4:51 PM

## 2021-06-04 ENCOUNTER — Telehealth: Payer: Self-pay | Admitting: Lab

## 2021-06-04 NOTE — Chronic Care Management (AMB) (Signed)
  Chronic Care Management   Note  06/04/2021 Name: [name] MRN: [id number] DOB: 18-Apr-1946  [name] is a 75 y.o. year old male who is a primary care patient of Plotnikov, Evie Lacks, MD. I reached out to [name] by phone today in response to a referral sent by [name]'s PCP, Plotnikov, Evie Lacks, MD.   [name] was given information about Chronic Care Management services today including:  CCM service includes personalized support from designated clinical staff supervised by his physician, including individualized plan of care and coordination with other care providers 24/7 contact phone numbers for assistance for urgent and routine care needs. Service will only be billed when office clinical staff spend 20 minutes or more in a month to coordinate care. Only one practitioner may furnish and bill the service in a calendar month. The patient may stop CCM services at any time (effective at the end of the month) by phone call to the office staff.   Patient agreed to services and verbal consent obtained.   Follow up plan:   [place]

## 2021-06-08 ENCOUNTER — Inpatient Hospital Stay: Payer: Medicare PPO | Attending: Hematology

## 2021-06-08 ENCOUNTER — Inpatient Hospital Stay (HOSPITAL_BASED_OUTPATIENT_CLINIC_OR_DEPARTMENT_OTHER): Payer: Medicare PPO | Admitting: Hematology

## 2021-06-08 ENCOUNTER — Other Ambulatory Visit: Payer: Self-pay

## 2021-06-08 VITALS — BP 139/68 | HR 90 | Temp 98.7°F | Resp 19 | Ht 75.0 in | Wt 302.1 lb

## 2021-06-08 DIAGNOSIS — C8330 Diffuse large B-cell lymphoma, unspecified site: Secondary | ICD-10-CM

## 2021-06-08 DIAGNOSIS — Z8572 Personal history of non-Hodgkin lymphomas: Secondary | ICD-10-CM | POA: Diagnosis not present

## 2021-06-08 LAB — CBC WITH DIFFERENTIAL/PLATELET
Abs Immature Granulocytes: 0.02 10*3/uL (ref 0.00–0.07)
Basophils Absolute: 0 10*3/uL (ref 0.0–0.1)
Basophils Relative: 0 %
Eosinophils Absolute: 0.2 10*3/uL (ref 0.0–0.5)
Eosinophils Relative: 3 %
HCT: 52.3 % — ABNORMAL HIGH (ref 39.0–52.0)
Hemoglobin: 17.4 g/dL — ABNORMAL HIGH (ref 13.0–17.0)
Immature Granulocytes: 0 %
Lymphocytes Relative: 27 %
Lymphs Abs: 1.8 10*3/uL (ref 0.7–4.0)
MCH: 31.8 pg (ref 26.0–34.0)
MCHC: 33.3 g/dL (ref 30.0–36.0)
MCV: 95.6 fL (ref 80.0–100.0)
Monocytes Absolute: 0.5 10*3/uL (ref 0.1–1.0)
Monocytes Relative: 7 %
Neutro Abs: 4 10*3/uL (ref 1.7–7.7)
Neutrophils Relative %: 63 %
Platelets: 130 10*3/uL — ABNORMAL LOW (ref 150–400)
RBC: 5.47 MIL/uL (ref 4.22–5.81)
RDW: 15.6 % — ABNORMAL HIGH (ref 11.5–15.5)
WBC: 6.5 10*3/uL (ref 4.0–10.5)
nRBC: 0 % (ref 0.0–0.2)

## 2021-06-08 LAB — CMP (CANCER CENTER ONLY)
ALT: 42 U/L (ref 0–44)
AST: 27 U/L (ref 15–41)
Albumin: 3.4 g/dL — ABNORMAL LOW (ref 3.5–5.0)
Alkaline Phosphatase: 61 U/L (ref 38–126)
Anion gap: 7 (ref 5–15)
BUN: 26 mg/dL — ABNORMAL HIGH (ref 8–23)
CO2: 29 mmol/L (ref 22–32)
Calcium: 9.3 mg/dL (ref 8.9–10.3)
Chloride: 104 mmol/L (ref 98–111)
Creatinine: 1.38 mg/dL — ABNORMAL HIGH (ref 0.61–1.24)
GFR, Estimated: 53 mL/min — ABNORMAL LOW (ref >60)
Glucose, Bld: 141 mg/dL — ABNORMAL HIGH (ref 70–99)
Potassium: 4.7 mmol/L (ref 3.5–5.1)
Sodium: 140 mmol/L (ref 135–145)
Total Bilirubin: 1.2 mg/dL (ref 0.3–1.2)
Total Protein: 6.5 g/dL (ref 6.5–8.1)

## 2021-06-08 LAB — LACTATE DEHYDROGENASE: LDH: 223 U/L — ABNORMAL HIGH (ref 98–192)

## 2021-06-09 ENCOUNTER — Telehealth: Payer: Self-pay | Admitting: Hematology

## 2021-06-09 NOTE — Progress Notes (Signed)
[facility] [employer] PREP Weekly Session   Patient Details  Name: JONNY SPRAGG MRN: [id number] Date of Birth: July 22, 1946 Age: 75 y.o. PCP: Plotnikov, Evie Lacks, MD  Vitals:   06/09/21 1509  Weight: 298 lb (135.2 kg)     [facility] [employer] Weekly seesion - 06/09/21 1500       Weekly Session   Topic Discussed Hitting roadblocks   Reviewed goals and activity plan document and PREP survey; asked both be completed and bring to final assessment visit on September 8   Minutes exercised this week 130 minutes    Classes attended to date [place] 06/09/2021, 3:10 PM

## 2021-06-09 NOTE — Telephone Encounter (Signed)
Scheduled follow-up appointment per 8/29 los. Patient is aware. 

## 2021-06-12 DIAGNOSIS — J301 Allergic rhinitis due to pollen: Secondary | ICD-10-CM | POA: Diagnosis not present

## 2021-06-12 DIAGNOSIS — J3089 Other allergic rhinitis: Secondary | ICD-10-CM | POA: Diagnosis not present

## 2021-06-14 ENCOUNTER — Encounter: Payer: Self-pay | Admitting: Hematology

## 2021-06-14 NOTE — Progress Notes (Signed)
HEMATOLOGY/ONCOLOGY CLINIC NOTE  Date of Service: .06/08/2021   Patient Care Team: Cassandria Anger, MD as PCP - General (Internal Medicine) Skeet Latch, MD as PCP - Cardiology (Cardiology) Brunetta Genera, MD as Consulting Physician (Hematology) McKenzie, Candee Furbish, MD as Consulting Physician (Urology) Jackquline Denmark, MD as Consulting Physician (Gastroenterology) Skeet Latch, MD as Attending Physician (Cardiology) Lynnell Dike, [place] as Consulting Physician (Optometry) Szabat, Darnelle Maffucci, [facility] as Pharmacist (Pharmacist)  CHIEF COMPLAINTS/PURPOSE OF CONSULTATION:  Follow Up for Diffuse Large B-Cell Lymphoma   HISTORY OF PRESENTING ILLNESS:   [name] is a wonderful 75 y.o. male who has been referred to [place] by Dr. Lew Dawes for evaluation and management of Diffuse Large B-Cell Lymphoma. He is accompanied today by his partner. The pt reports that he is doing well overall.   The pt reports that he presented to ENT Dr. Pollie Friar care after developing a cough and a new nodule in his mouth. He notes that the symptoms presented about a month ago and have not progressed or significantly worsened. He notes that he has frequent sinus drainage, and felt that this enlargement could initially be related to this. He denies any changes in his voice and difficulty breathing.   The pt notes that he is finishing three months of PT tomorrow after surgeries on his right leg and left knee. He notes that he takes blood thinners for Afib and uses his CPAP regularly now. The pt also receives testosterone injections, '200mg'$  every other week. The pt notes that he had several surgeries bilaterally to remove large kidney stones in the past. He notes that his thyroid replacement has been stable. His vertebral compression fracture was in 2013, after a fall in which he also broke his right femur and wrist. The pt also takes Prolia every 6 months for his osteoporosis. He has had 18  surgeries on his right leg.   He notes that his psoriasis has been stable and takes Enbrel twice a week, and has taken this for 5 years. He was on 10 years of Humara before this. He was on Methotrexate prior to [facility].   The pt notes that he functions okay at home and is able to drive himself and cook for himself and feels that he can function independently with limitations on lifting heavier objects.   On review of systems, pt reports recent cough, left tonsil mass, stable energy levels, and denies fevers, chills, night sweats, unexpected weight loss, difficulty breathing, difficulty swallowing, noticing any other lumps or bumps, pain along the spine, recent infections, abdominal pains, difficulty urinating, leg swelling, and any other symptoms.  . On Social Hx the pt reports that he smoked cigarettes for many years until he quit 6 years ago, and drinks ETOH socially.    INTERVAL HISTORY   [name] is here for followup and active surveillance of his Diffuse Large B-Cell Lymphoma. The patient's last visit with [place] was  was about 6 months ago .the pt reports that he is doing well overall.  The pt reports no new symptoms or concerns.  He notes he has been doing well and had significant benefits in working with physical therapy.  Feels stronger and is more mobile.  No new falls.  Lab results today .  CBC shows no anemia.  Mild polycythemia noted.  Chronic stable mild thrombocytopenia with platelets of 130k  CMP stable LDH stable at 223  Patient notes no fevers no chills no night sweats no  unexpected weight loss no new lumps or bumps.  MEDICAL HISTORY:  Past Medical History:  Diagnosis Date   Anticoagulant long-term use    xarelto--- takes for AFib,  managed by pcp, Dr Alain Marion   Benign localized prostatic hyperplasia with lower urinary tract symptoms (LUTS)    CAD (coronary artery disease) cardiologist--  dr t. Oval Linsey   hx of known CAD obstruction w/ collaterals (cath done @  [facility] 12-31-2011) ;   last cardiac cath 08-13-2019  showed sig. 2V CAD involving proxLAD and CTO of the RCA/  chronic total occlusion midRCA w/ bridging and L>R collaterals   Diastolic CHF ([place])    dx [id number] hospital admission (followed by pcp)   Diffuse large B cell lymphoma [facility]) oncologist-- dr Irene Limbo--- in remission   dx 08/ 2019 -- bx left tonsill mass-- involving lymph nodes--- completed chemotherapy 10-12-2018   DJD (degenerative joint disease)    Dysthymic disorder    Elevated brain natriuretic peptide (BNP) level    Environmental allergies    GAD (generalized anxiety disorder)    GERD (gastroesophageal reflux disease)    History of falling    multiple times   History of Graves' disease    1987 s/p total thyroidectomy   History of kidney stones    History of scabies    07/ 2014  resolved   History of syncope    per D/C note in epic 02-21-2013 ?vasovagal   History of vertebral compression fracture    05/ 2014 --  L1, no surgical intervention   HLD (hyperlipidemia)    Hyperlipidemia    Hypertension    Hypogonadism in male    IDA (iron deficiency anemia)    Lumbago    OA (osteoarthritis)    OSA on CPAP    cpap machine-settings 17   Osteoporosis    Severe   Persistent atrial fibrillation [facility])    cardiologist--- dr Alfonso Patten.    Plaque psoriasis    dermatologist--- dr Harriett Sine--- currently taking otezla   Post-surgical hypothyroidism    followed by pcp---  s/p total thyroidectomy for graves disease in 1987   Pure hypercholesterolemia 02/10/2021   Renal calculus, right    Thrombocytopenia ([place])    Unsteady gait    uses walker   Wears hearing aid in both ears     SURGICAL HISTORY: Past Surgical History:  Procedure Laterality Date   APPENDECTOMY  2011 approx.    CARDIAC CATHETERIZATION  12-31-2011   dr j. Beatrix Fetters '@[facility]'$    normal LVF w/ multivessel CAD,  occluded RCA w/ colleterals   CATARACT EXTRACTION W/ INTRAOCULAR LENS  IMPLANT, BILATERAL  2016 approx.    COLONOSCOPY     CYSTOSCOPY WITH RETROGRADE PYELOGRAM, URETEROSCOPY AND STENT PLACEMENT Left 11/07/2017   Procedure: CYSTOSCOPY WITH RETROGRADE PYELOGRAM, URETEROSCOPY AND STENT PLACEMENT;  Surgeon: Cleon Gustin, MD;  Location: [facility];  Service: Urology;  Laterality: Left;   CYSTOSCOPY WITH RETROGRADE PYELOGRAM, URETEROSCOPY AND STENT PLACEMENT Right 10/22/2019   Procedure: CYSTOSCOPY WITH RETROGRADE PYELOGRAM, URETEROSCOPY AND STENT PLACEMENT;  Surgeon: Cleon Gustin, MD;  Location: [facility];  Service: Urology;  Laterality: Right;   CYSTOSCOPY/URETEROSCOPY/HOLMIUM LASER/STENT PLACEMENT Right 03/11/2017   Procedure: CYSTOSCOPY/URETEROSCOPYSTENT PLACEMENT right ureter retrograde pylegram;  Surgeon: Cleon Gustin, MD;  [facility];  Service: Urology;  Laterality: Right;   HARDWARE REMOVAL  10/05/2012   Procedure: HARDWARE REMOVAL;  Surgeon: [name], MD;  [facility];  Service: Orthopedics;  Laterality: Right;  REMOVING  STRYKER  GAMMA NAIL   HARDWARE REMOVAL Right 07/03/2013   Procedure: HARDWARE REMOVAL RIGHT TIBIA ;  Surgeon: Rozanna Box, MD;  Location: [place];  Service: Orthopedics;  Laterality: Right;   HIP CLOSED REDUCTION Right 10/15/2013   Procedure: CLOSED MANIPULATION HIP;  Surgeon: [name], MD;  [facility];  Service: Orthopedics;  Laterality: Right;   HOLMIUM LASER APPLICATION Right [id number]   Procedure: HOLMIUM LASER APPLICATION;  Surgeon: Cleon Gustin, MD;  [facility];  Service: Urology;  Laterality: Right;   HOLMIUM LASER APPLICATION Left [id number]   Procedure: HOLMIUM LASER APPLICATION;  Surgeon: Cleon Gustin, MD;  [facility];  Service: Urology;  Laterality: Left;   HOLMIUM LASER APPLICATION Left [id number]   Procedure: HOLMIUM LASER APPLICATION;  Surgeon: Cleon Gustin, MD;  [facility];  Service: Urology;  Laterality: Left;   HOLMIUM LASER APPLICATION Right [id number]   Procedure:  HOLMIUM LASER APPLICATION;  Surgeon: Cleon Gustin, MD;  Location: [facility];  Service: Urology;  Laterality: Right;   INCISION AND DRAINAGE HIP Right 11/16/2013   Procedure: IRRIGATION AND DEBRIDEMENT RIGHT HIP;  Surgeon: [name], MD;  [facility];  Service: Orthopedics;  Laterality: Right;   INTRAVASCULAR PRESSURE WIRE/FFR STUDY N/A 08/13/2019   Procedure: INTRAVASCULAR PRESSURE WIRE/FFR STUDY;  Surgeon: Nelva Bush, MD;  Location: [place] [facility];  Service: Cardiovascular;  Laterality: N/A;   IR IMAGING GUIDED PORT INSERTION  06/22/2018   IR NEPHROSTOMY PLACEMENT LEFT  03/28/2019   IR URETERAL STENT LEFT NEW ACCESS W/O SEP NEPHROSTOMY CATH  10/24/2017   IR URETERAL STENT LEFT NEW ACCESS W/O SEP NEPHROSTOMY CATH  03/26/2019   NEPHROLITHOTOMY Right 02/08/2017   Procedure: NEPHROLITHOTOMY PERCUTANEOUS WITH SURGEON ACCESS;  Surgeon: Cleon Gustin, MD;  [facility];  Service: Urology;  Laterality: Right;   NEPHROLITHOTOMY Left 10/24/2017   Procedure: NEPHROLITHOTOMY PERCUTANEOUS;  Surgeon: Cleon Gustin, MD;  [facility];  Service: Urology;  Laterality: Left;   NEPHROLITHOTOMY Left 03/26/2019   Procedure: NEPHROLITHOTOMY PERCUTANEOUS;  Surgeon: Cleon Gustin, MD;  [facility];  Service: Urology;  Laterality: Left;  2 HRS   NEPHROLITHOTOMY Left 04/17/2019   Procedure: NEPHROLITHOTOMY PERCUTANEOUS;  Surgeon: Cleon Gustin, MD;  [facility];  Service: Urology;  Laterality: Left;  2 HRS   ORIF TIBIA FRACTURE Right 02/06/2013   Procedure: OPEN REDUCTION INTERNAL FIXATION (ORIF) TIBIA FRACTURE WITH IM ROD FIBULA;  Surgeon: Rozanna Box, MD;  Location: [place];  Service: Orthopedics;  Laterality: Right;   ORIF TIBIA FRACTURE Right 07/03/2013   Procedure: RIGHT TIBIA NON UNION REPAIR ;  Surgeon: Rozanna Box, MD;  Location: [place];  Service: Orthopedics;  Laterality: Right;   ORIF WRIST FRACTURE  10/02/2012   Procedure: OPEN  REDUCTION INTERNAL FIXATION (ORIF) WRIST FRACTURE;  Surgeon: Roseanne Kaufman, MD;  [facility];  Service: Orthopedics;  Laterality: Right;  WITH   ANTIBIOTIC  CEMENT   ORIF WRIST FRACTURE Left 10/28/2013   Procedure: OPEN REDUCTION INTERNAL FIXATION (ORIF) WRIST FRACTURE with allograft;  Surgeon: Roseanne Kaufman, MD;  [facility];  Service: Orthopedics;  Laterality: Left;  DVR Plate   QUADRICEPS TENDON REPAIR Left 07/15/2017   Procedure: REPAIR QUADRICEP TENDON;  Surgeon: Frederik Pear, MD;  Location: [place];  Service: Orthopedics;  Laterality: Left;   RIGHT/LEFT HEART CATH AND CORONARY ANGIOGRAPHY N/A 08/13/2019   Procedure: RIGHT/LEFT HEART CATH AND CORONARY ANGIOGRAPHY;  Surgeon: End,  Harrell Gave, MD;  Location: [place] [facility];  Service: Cardiovascular;  Laterality: N/A;   THYROIDECTOMY  02/1986   TOTAL HIP ARTHROPLASTY Right 03-16-2016   '@[facility]'$    TOTAL HIP REVISION  10/05/2012   Procedure: TOTAL HIP REVISION;  Surgeon: [name], MD;  [facility];  Service: Orthopedics;  Laterality: Right;  RIGHT TOTAL HIP REVISION   TOTAL HIP REVISION Right 09/17/2013   Procedure: REVISION RIGHT TOTAL HIP ARTHROPLASTY ;  Surgeon: [name], MD;  [facility];  Service: Orthopedics;  Laterality: Right;   TOTAL HIP REVISION Right 10/26/2013   Procedure: REVISION RIGHT TOTAL HIP ARTHROPLASTY;  Surgeon: [name], MD;  [facility];  Service: Orthopedics;  Laterality: Right;   TOTAL KNEE ARTHROPLASTY Bilateral right 03-15-2011;  left 06-30-2011   TOTAL KNEE REVISION Left 04/11/2017   Procedure: TOTAL KNEE REVISION PATELLA and TIBIA;  Surgeon: Frederik Pear, MD;  Location: [place];  Service: Orthopedics;  Laterality: Left;    SOCIAL HISTORY: Social History   Socioeconomic History   Marital status: Married    Spouse name: Not on file   Number of children: Not on file   Years of education: Not on file   Highest education level: Not on file  Occupational History   Not on file   Tobacco Use   Smoking status: Former    Packs/day: 1.00    Years: 50.00    Pack years: 50.00    Types: Cigarettes    Quit date: 01/12/2012    Years since quitting: 9.4   Smokeless tobacco: Never  Vaping Use   Vaping Use: Never used  Substance and Sexual Activity   Alcohol use: Not Currently    Comment: occasional-social   Drug use: Never   Sexual activity: Yes  Other Topics Concern   Not on file  Social History Narrative   [place] 2.5 months, went home Feb 21st slipped and fell on back and developed.  Home PT/OT.  Just started outpatient physical therapy.  Friday night, misstepped.     Social Determinants of Health   Financial Resource Strain: Low Risk    Difficulty of Paying Living Expenses: Not hard at all  Food Insecurity: No Food Insecurity   Worried About Charity fundraiser in the Last Year: Never true   [place] in the Last Year: Never true  Transportation Needs: No Transportation Needs   Lack of Transportation (Medical): No   Lack of Transportation (Non-Medical): No  Physical Activity: Sufficiently Active   Days of Exercise per Week: 5 days   Minutes of Exercise per Session: 30 min  Stress: No Stress Concern Present   Feeling of Stress : Not at all  Social Connections: Socially Integrated   Frequency of Communication with Friends and Family: More than three times a week   Frequency of Social Gatherings with Friends and Family: More than three times a week   Attends Religious Services: 1 to 4 times per year   Active Member of [employer] or Organizations: No   Attends Music therapist: 1 to 4 times per year   Marital Status: Married  Human resources officer Violence: Not on file    FAMILY HISTORY: Family History  Problem Relation Age of Onset   CAD Father 42   Asthma Father    Alcohol abuse Father    Arthritis Mother    Alcohol abuse Sister     ALLERGIES:  is allergic to short ragweed pollen ext.  MEDICATIONS:  Current Outpatient Medications   Medication Sig Dispense Refill   acetaminophen (TYLENOL) 500 MG tablet Take 1,000 mg by mouth daily as needed for moderate pain.     alfuzosin (UROXATRAL) 10 MG 24 hr tablet TAKE 1 TABLET AT BEDTIME 90 tablet 3   amoxicillin (AMOXIL) 500 MG capsule Take 2,000 mg by mouth See admin instructions. Take 4 capsules (2000 mg) by mouth 1 hour prior to dental work     aspirin EC 81 MG tablet Take 81 mg by mouth daily.     BELSOMRA 15 MG TABS TAKE 1 TABLET BY MOUTH  DAILY (Patient taking differently: Take 1 tablet by mouth at bedtime as needed.) 90 tablet 1   benzonatate (TESSALON) 100 MG capsule Take 1 capsule (100 mg total) by mouth every 8 (eight) hours. 21 capsule 0   buPROPion (WELLBUTRIN XL) 300 MG 24 hr tablet TAKE 1 TABLET EVERY DAY WITH BREAKFAST 90 tablet 3   Calcium Citrate-Vitamin D (CALCIUM CITRATE + D PO) Take 1 tablet by mouth 2 (two) times daily.      Cholecalciferol (VITAMIN D-3 PO) Take 2,000 Units by mouth 2 (two) times daily.      clonazePAM (KLONOPIN) 1 MG tablet TAKE 1 TABLET EVERY 6 HOURS AS NEEDED FOR ANXIETY 120 tablet 3   denosumab (PROLIA) 60 MG/ML SOSY injection Inject 60 mg as directed every 6 (six) months. Last injection: Feb 2020 Next injection: August 2020     diltiazem (CARDIZEM CD) 240 MG 24 hr capsule TAKE 1 CAPSULE EVERY DAY 90 capsule 3   DULoxetine (CYMBALTA) 60 MG capsule TAKE 1 CAPSULE TWICE DAILY 180 capsule 3   ezetimibe (ZETIA) 10 MG tablet TAKE 1 TABLET EVERY DAY. 90 tablet 3   ferrous sulfate 325 (65 FE) MG tablet Take 325 mg by mouth 2 (two) times daily with a meal.      hydrochlorothiazide (HYDRODIURIL) 12.5 MG tablet Take 1 tablet (12.5 mg total) by mouth daily. 90 tablet 3   HYDROcodone-acetaminophen (NORCO) 7.5-325 MG tablet Take 1 tablet by mouth every 6 (six) hours as needed for severe pain. 120 tablet 0   isosorbide mononitrate (IMDUR) 30 MG 24 hr tablet Take 1 tablet (30 mg total) by mouth daily. 90 tablet 3   levothyroxine (SYNTHROID) 200 MCG tablet  TAKE 1 TABLET EVERY DAY AT 6AM 90 tablet 3   loratadine (CLARITIN) 10 MG tablet Take 10 mg by mouth daily.      losartan (COZAAR) 50 MG tablet TAKE 1 TABLET EVERY DAY 90 tablet 2   Magnesium 500 MG CAPS Take 1 capsule (500 mg total) by mouth daily. (Patient taking differently: Take 500 mg by mouth every evening.) 30 capsule 1   metoprolol tartrate (LOPRESSOR) 25 MG tablet Take 1 tablet (25 mg total) by mouth 2 (two) times daily. 180 tablet 3   mirabegron ER (MYRBETRIQ) 25 MG TB24 tablet Take 25 mg by mouth at bedtime.      Multiple Vitamin (MULTIVITAMIN WITH MINERALS) TABS tablet Take 1 tablet by mouth daily. Men's One-A-Day 50+     nitroGLYCERIN (NITROSTAT) 0.4 MG SL tablet Place 0.4 mg under the tongue every 5 (five) minutes as needed for chest pain. x3 doses as needed for chest pain     omeprazole (PRILOSEC) 40 MG capsule TAKE 1 CAPSULE EVERY DAY BEFORE BREAKFAST 90 capsule 2   Potassium Citrate 15 MEQ (1620 MG) TBCR TAKE 1 TABLET TWICE DAILY 180 tablet 3   Probiotic Product (PROBIOTIC PO) Take 1  capsule by mouth daily with breakfast.     Risankizumab-rzaa (SKYRIZI PEN [place]) Inject into the skin.     testosterone enanthate (DELATESTRYL) 200 MG/ML injection INJECT 1ML ('200MG'$ ) INTO THE MUSCLE EVERY 14 DAYS. DISCARD VIAL AFTER 28 DAYS. 5 mL 3   tiZANidine (ZANAFLEX) 4 MG tablet TAKE 1 TABLET EVERY 6 HOURS AS NEEDED FOR MUSCLE SPASM(S) 360 tablet 1   vitamin C (ASCORBIC ACID) 500 MG tablet Take 500 mg by mouth daily.     XARELTO 10 MG TABS tablet TAKE 1 TABLET EVERY DAY 90 tablet 1   No current facility-administered medications for this visit.    REVIEW OF SYSTEMS:   .10 Point review of Systems was done is negative except as noted above.   PHYSICAL EXAMINATION: SKIN: chronic venous stasis changes in BLE, psoriasis rashes on elbows have improved with Otezla   ECOG FS:3 - Symptomatic, >50% confined to bed  Vitals:   06/08/21 1407  BP: 139/68  Pulse: 90  Resp: 19  Temp: 98.7 F (37.1 C)   SpO2: 96%   Wt Readings from Last 3 Encounters:  06/09/21 298 lb (135.2 kg)  06/08/21 (!) 302 lb 1.6 oz (137 kg)  06/02/21 299 lb (135.6 kg)    Body mass index is 37.76 kg/m.    [name] GENERAL:alert, in no acute distress and comfortable SKIN: no acute rashes, no significant lesions EYES: conjunctiva are pink and non-injected, sclera anicteric OROPHARYNX: MMM, no exudates, no oropharyngeal erythema or ulceration NECK: supple, no JVD LYMPH:  no palpable lymphadenopathy in the cervical, axillary or inguinal regions LUNGS: clear to auscultation b/l with normal respiratory effort HEART: regular rate & rhythm ABDOMEN:  normoactive bowel sounds , non tender, not distended. Extremity: no pedal edema PSYCH: alert & oriented x 3 with fluent speech NEURO: no focal motor/sensory deficits  LABORATORY DATA:  I have reviewed the data as listed  CBC Latest Ref Rng & Units 06/08/2021 05/19/2021 12/08/2020  WBC 4.0 - 10.5 K/uL 6.5 8.8 7.2  Hemoglobin 13.0 - 17.0 g/dL 17.4(H) 14.8 16.6  Hematocrit 39.0 - 52.0 % 52.3(H) 44.7 51.3  Platelets 150 - 400 K/uL 130(L) 134(L) 158    CMP Latest Ref Rng & Units 06/08/2021 05/19/2021 02/24/2021  Glucose 70 - 99 mg/dL 141(H) 99 129(H)  BUN 8 - 23 mg/dL 26(H) 33(H) 31(H)  Creatinine 0.61 - 1.24 mg/dL 1.38(H) 1.44(H) 1.37(H)  Sodium 135 - 145 mmol/L 140 137 138  Potassium 3.5 - 5.1 mmol/L 4.7 4.1 4.7  Chloride 98 - 111 mmol/L 104 99 102  CO2 22 - 32 mmol/L '29 28 29  '$ Calcium 8.9 - 10.3 mg/dL 9.3 9.0 9.2  Total Protein 6.5 - 8.1 g/dL 6.5 - 6.7  Total Bilirubin 0.3 - 1.2 mg/dL 1.2 - 0.9  Alkaline Phos 38 - 126 U/L 61 - 45  AST 15 - 41 U/L 27 - 26  ALT 0 - 44 U/L 42 - 29   . Lab Results  Component Value Date   LDH 223 (H) 06/08/2021   06/28/2019 ECHOCARDIOGRAM COMPLETE (Accession [id number])   05/29/18 Left tonsil biopsy:   RADIOGRAPHIC STUDIES: I have personally reviewed the radiological images as listed and agreed with the findings in the report. DG  Chest Port 1 View  Result Date: 05/19/2021 CLINICAL DATA:  Dyspnea, history of CHF and shortness of breath EXAM: PORTABLE CHEST 1 VIEW COMPARISON:  Chest radiograph 06/15/2019 FINDINGS: A right-sided central venous catheter is in place terminating in the SVC. The heart is enlarged. The  mediastinal contours are within normal limits. There is vascular congestion with interstitial prominence suggesting pulmonary interstitial edema. There may be trace bilateral pleural effusions. There is no focal consolidation. There is no pneumothorax. There is a 1.0 cm nodular opacity projecting over the left upper lobe not seen on the prior study. There is no acute osseous abnormality. IMPRESSION: 1. Cardiomegaly with mild pulmonary interstitial edema and possible trace bilateral pleural effusions. 2. 1.0 cm nodular opacity projecting over the left upper lobe not seen on the prior study. This may be artifactual; however, recommend nonemergent CT chest to exclude a true parenchymal nodule Electronically Signed   By: Valetta Mole MD   On: 05/19/2021 09:55    ASSESSMENT & PLAN:  75 y.o. male with  1. Diffuse Large B-Cell Lymphoma,- Stage III Left tonsil -05/29/18 Left tonsil biopsy which revealed Diffuse Large B-Cell Lymphoma, which requires treatment  -06/07/18 LDH at 214, will monitor change through treatment.  -Baseline ECHO 06/14/18: EF 50-55% -Standard regimen of R-CHOP with G-CSF support for 6 cycles starting 06/23/18  06/16/18 PET/CT revealed 1. Deauville 5 hypermetabolic lesions in the palatine tonsils, left internal jugular chain, and involving a right inguinal lymph node. Deauville 4 involvement of a left inguinal lymph node and an enlarged subcutaneous lesion favoring lymph node superficial to the lower right sternocleidomastoid muscle in the neck. 2. Other imaging findings of potential clinical significance: Aortic Atherosclerosis. Coronary atherosclerosis. Emphysema. Bilateral nonobstructive nephrolithiasis. Umbilical  hernia contains adipose tissues. Old right rib fractures some of which are nonunited. 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   08/22/18 PET/CT revealed Interval response to therapy. Overall Deauville criteria 3. Interval decrease in size and FDG uptake associated with palatine tonsil lesions. There has been resolution of previous left level-II and bilateral inguinal lymph nodes. 2. Enlarged subcutaneous lesion superficial to the lower right sternocleidomastoid muscle exhibits decreased FDG uptake. Currently Deauville criteria 2. 3. No new or progressive sites of disease identified. 4.  Aortic Atherosclerosis. 5. Multi vessel coronary artery atherosclerotic calcifications. 6. Unchanged vertebra plana deformity. 7. Kidney stones.   S/p 6 cycles of R-CHOP completed on 10/12/18  11/22/18 PET/CT revealed The tonsillar mass has essentially resolved. The large presumed lymph node superficial to the right sternocleidomastoid is stable in size at 2.3 cm in short axis, with maximum SUV of 3.3 (formerly 2.4), representing Deauville 3 activity, previously Deauville 2. 2. Other imaging findings of potential clinical significance: Aortic Atherosclerosis and Emphysema. Coronary atherosclerosis. Bilateral nonobstructive nephrolithiasis. Umbilical hernia contains adipose tissue. Chronic high activity posterior to the right hip implant along a region of bony irregularity and deformity probably reflecting chronic inflammation. L1 vertebra plana.  2.  Patient Active Problem List   Diagnosis Date Noted   Pure hypercholesterolemia 02/10/2021   Chronic diastolic heart failure ([place]) 06/21/2019   H/O nephrolithotomy with removal of calculi 03/26/2019   Renal calculus 03/26/2019   Generalized weakness 11/08/2018   Well adult exam 10/02/2018   Counseling regarding advance care planning and goals of care 07/09/2018   Diffuse large B-cell lymphoma ([place]) 06/23/2018   Port-A-Cath in place 06/23/2018   Hypogonadism in male 11/22/2017    Nephrolithiasis 10/24/2017   Recurrent dislocation of left patella 07/15/2017   Recurrent subluxation of patella, left 07/08/2017   S/P revision of total knee 04/11/2017   Failed total knee, left, initial encounter ([place]) 04/09/2017   Renal calculus, right 02/08/2017   Protein-calorie malnutrition, severe ([place]) 12/08/2016   Chronic obstructive pulmonary disease ([place]) 12/08/2016   Staghorn renal calculus 11/05/2016  Wound drainage right hip 11/15/2013   Expected blood loss anemia 09/18/2013   Hyponatremia 09/18/2013   S/P right TH revision 09/17/2013   Nonunion, fracture, Right tibia  07/03/2013   UTI (urinary tract infection) 07/03/2013   Pathological fracture due to osteoporosis with delayed healing 04/24/2013   Osteoporosis with fracture 04/24/2013   Rash and nonspecific skin eruption 03/08/2013   Syncope 02/21/2013   Chronic anticoagulation 02/21/2013   Fall 02/08/2013   Physical deconditioning 02/08/2013   Low back pain radiating to both legs 02/08/2013   Compression fracture of L1 lumbar vertebra ([place]) 02/08/2013   Right tibial fracture 02/05/2013   Morbid obesity ([place]) 02/05/2013   Osteomyelitis of right wrist ([place]) 11/02/2012   Anemia 10/06/2012   Essential hypertension 10/03/2012   Psoriasis 10/03/2012   Vitamin D deficiency 10/03/2012   GERD (gastroesophageal reflux disease) 10/03/2012   Hyperglycemia 10/03/2012   Anxiety 10/03/2012   Depression 10/03/2012   CAD (coronary artery disease) 10/03/2012   DJD (degenerative joint disease) 10/03/2012   Chronic gingivitis 10/03/2012   Low testosterone, possible hypogonadism 10/01/2012   Normocytic anemia 09/30/2012   Right femoral fracture ([place]) 09/29/2012   Right forearm fracture 09/29/2012   Longstanding persistent atrial fibrillation ([place]) 09/29/2012   Hypothyroidism 09/29/2012   History of recurrent UTIs 09/29/2012    PLAN:  -Discussed pt labwork today, 06/08/2021 -No lab or clinical evidence of DLBCL recurrence  at this time. Will continue watchful observation. -Discussed need for vaccinations including new COVID booster and flu shot when available . -Will see back in 6 months with labs.   FOLLOW UP: RTC with Dr Irene Limbo with labs in 6 months  . The total time spent in the appointment was 20 minutes and more than 50% was on counseling and direct patient cares.  All of the patient's questions were answered with apparent satisfaction. The patient knows to call the clinic with any problems, questions or concerns.   Sullivan Lone MD [place] AAHIVMS [facility] Hematology/Oncology [facility]  (Office):       [phone] (Work cell):  [phone] (Fax):           [phone]

## 2021-06-16 DIAGNOSIS — J3089 Other allergic rhinitis: Secondary | ICD-10-CM | POA: Diagnosis not present

## 2021-06-16 DIAGNOSIS — J301 Allergic rhinitis due to pollen: Secondary | ICD-10-CM | POA: Diagnosis not present

## 2021-06-16 NOTE — Progress Notes (Signed)
[facility] [employer] PREP Weekly Session   Patient Details  Name: TOLUWANIMI MCCALEB MRN: [id number] Date of Birth: 1946-05-04 Age: 75 y.o. PCP: Plotnikov, Evie Lacks, MD  Vitals:   06/16/21 1433  Weight: 298 lb (135.2 kg)     [facility] Weekly seesion - 06/16/21 1400       Weekly Session   Topic Discussed Other   Fit testing completed, how fit and strong survey completed; asked to completed goals and activity plan and PREP survey and bring to final assessment visit on Thursday.   Minutes exercised this week 50 minutes    Classes attended to date [place] 06/16/2021, 2:35 PM

## 2021-06-18 ENCOUNTER — Other Ambulatory Visit: Payer: Self-pay | Admitting: Cardiovascular Disease

## 2021-06-18 ENCOUNTER — Other Ambulatory Visit: Payer: Self-pay | Admitting: Internal Medicine

## 2021-06-18 DIAGNOSIS — J301 Allergic rhinitis due to pollen: Secondary | ICD-10-CM | POA: Diagnosis not present

## 2021-06-18 DIAGNOSIS — J3089 Other allergic rhinitis: Secondary | ICD-10-CM | POA: Diagnosis not present

## 2021-06-18 NOTE — Progress Notes (Signed)
[facility]   Patient Details  Name: [name] MRN: [id number] Date of Birth: 1946/06/02 Age: 75 y.o. PCP: Plotnikov, Evie Lacks, MD  Vitals:   06/18/21 1518  BP: 124/62  Pulse: 66  SpO2: 98%  Weight: 295 lb (133.8 kg)      [facility] [employer] Eval - 06/18/21 1500       Mobility and Daily Activities   I find it easy to walk up or down two or more flights of stairs. 1    I have no trouble taking out the trash. 1    I do housework such as vacuuming and dusting on my own without difficulty. 1    I can easily lift a gallon of milk (8lbs). 4    I can easily walk a mile. 2    I have no trouble reaching into high cupboards or reaching down to pick up something from the floor. 4    I do not have trouble doing out-door work such as Armed forces logistics/support/administrative officer, raking leaves, or gardening. 1      Mobility and Daily Activities   I feel younger than my age. 4    I feel independent. 4    I feel energetic. 2    I live an active life.  3    I feel strong. 3    I feel healthy. 3    I feel active as other people my age. 3      How fit and strong are you.   Fit and Strong Total Score 36            Past Medical History:  Diagnosis Date   Anticoagulant long-term use    xarelto--- takes for AFib,  managed by pcp, Dr Alain Marion   Benign localized prostatic hyperplasia with lower urinary tract symptoms (LUTS)    CAD (coronary artery disease) cardiologist--  dr t. Oval Linsey   hx of known CAD obstruction w/ collaterals (cath done @ [facility] 12-31-2011) ;   last cardiac cath 08-13-2019  showed sig. 2V CAD involving proxLAD and CTO of the RCA/  chronic total occlusion midRCA w/ bridging and L>R collaterals   Diastolic CHF ([place])    dx [id number] hospital admission (followed by pcp)   Diffuse large B cell lymphoma [facility]) oncologist-- dr Irene Limbo--- in remission   dx 08/ 2019 -- bx left tonsill mass-- involving lymph nodes--- completed chemotherapy 10-12-2018   DJD (degenerative joint disease)     Dysthymic disorder    Elevated brain natriuretic peptide (BNP) level    Environmental allergies    GAD (generalized anxiety disorder)    GERD (gastroesophageal reflux disease)    History of falling    multiple times   History of Graves' disease    1987 s/p total thyroidectomy   History of kidney stones    History of scabies    07/ 2014  resolved   History of syncope    per D/C note in epic 02-21-2013 ?vasovagal   History of vertebral compression fracture    05/ 2014 --  L1, no surgical intervention   HLD (hyperlipidemia)    Hyperlipidemia    Hypertension    Hypogonadism in male    IDA (iron deficiency anemia)    Lumbago    OA (osteoarthritis)    OSA on CPAP    cpap machine-settings 17   Osteoporosis    Severe   Persistent atrial fibrillation [facility])    cardiologist--- dr Alfonso Patten. Oval Linsey  Plaque psoriasis    dermatologist--- dr Harriett Sine--- currently taking otezla   Post-surgical hypothyroidism    followed by pcp---  s/p total thyroidectomy for graves disease in 1987   Pure hypercholesterolemia 02/10/2021   Renal calculus, right    Thrombocytopenia (HCC)    Unsteady gait    uses walker   Wears hearing aid in both ears    Past Surgical History:  Procedure Laterality Date   APPENDECTOMY  2011 approx.    CARDIAC CATHETERIZATION  12-31-2011   dr j. Beatrix Fetters '@[facility]'$    normal LVF w/ multivessel CAD,  occluded RCA w/ colleterals   CATARACT EXTRACTION W/ INTRAOCULAR LENS  IMPLANT, BILATERAL  2016 approx.   COLONOSCOPY     CYSTOSCOPY WITH RETROGRADE PYELOGRAM, URETEROSCOPY AND STENT PLACEMENT Left 11/07/2017   Procedure: CYSTOSCOPY WITH RETROGRADE PYELOGRAM, URETEROSCOPY AND STENT PLACEMENT;  Surgeon: Cleon Gustin, MD;  Location: [facility];  Service: Urology;  Laterality: Left;   CYSTOSCOPY WITH RETROGRADE PYELOGRAM, URETEROSCOPY AND STENT PLACEMENT Right 10/22/2019   Procedure: CYSTOSCOPY WITH RETROGRADE PYELOGRAM, URETEROSCOPY AND STENT PLACEMENT;  Surgeon: Cleon Gustin, MD;  Location: [facility];  Service: Urology;  Laterality: Right;   CYSTOSCOPY/URETEROSCOPY/HOLMIUM LASER/STENT PLACEMENT Right 03/11/2017   Procedure: CYSTOSCOPY/URETEROSCOPYSTENT PLACEMENT right ureter retrograde pylegram;  Surgeon: Cleon Gustin, MD;  [facility];  Service: Urology;  Laterality: Right;   HARDWARE REMOVAL  10/05/2012   Procedure: HARDWARE REMOVAL;  Surgeon: [name], MD;  [facility];  Service: Orthopedics;  Laterality: Right;  REMOVING  STRYKER  GAMMA NAIL   HARDWARE REMOVAL Right 07/03/2013   Procedure: HARDWARE REMOVAL RIGHT TIBIA ;  Surgeon: Rozanna Box, MD;  Location: [place];  Service: Orthopedics;  Laterality: Right;   HIP CLOSED REDUCTION Right 10/15/2013   Procedure: CLOSED MANIPULATION HIP;  Surgeon: [name], MD;  [facility];  Service: Orthopedics;  Laterality: Right;   HOLMIUM LASER APPLICATION Right [id number]   Procedure: HOLMIUM LASER APPLICATION;  Surgeon: Cleon Gustin, MD;  [facility];  Service: Urology;  Laterality: Right;   HOLMIUM LASER APPLICATION Left [id number]   Procedure: HOLMIUM LASER APPLICATION;  Surgeon: Cleon Gustin, MD;  [facility];  Service: Urology;  Laterality: Left;   HOLMIUM LASER APPLICATION Left [id number]   Procedure: HOLMIUM LASER APPLICATION;  Surgeon: Cleon Gustin, MD;  [facility];  Service: Urology;  Laterality: Left;   HOLMIUM LASER APPLICATION Right [id number]   Procedure: HOLMIUM LASER APPLICATION;  Surgeon: Cleon Gustin, MD;  Location: [facility];  Service: Urology;  Laterality: Right;   INCISION AND DRAINAGE HIP Right 11/16/2013   Procedure: IRRIGATION AND DEBRIDEMENT RIGHT HIP;  Surgeon: [name], MD;  [facility];  Service: Orthopedics;  Laterality: Right;   INTRAVASCULAR PRESSURE WIRE/FFR STUDY N/A 08/13/2019   Procedure: INTRAVASCULAR PRESSURE WIRE/FFR STUDY;  Surgeon: Nelva Bush, MD;  Location: [place];  Service: Cardiovascular;  Laterality: N/A;   IR IMAGING GUIDED PORT INSERTION  06/22/2018   IR NEPHROSTOMY PLACEMENT LEFT  03/28/2019   IR URETERAL STENT LEFT NEW ACCESS W/O SEP NEPHROSTOMY CATH  10/24/2017   IR URETERAL STENT LEFT NEW ACCESS W/O SEP NEPHROSTOMY CATH  03/26/2019   NEPHROLITHOTOMY Right 02/08/2017   Procedure: NEPHROLITHOTOMY PERCUTANEOUS WITH SURGEON ACCESS;  Surgeon: Cleon Gustin, MD;  [facility];  Service: Urology;  Laterality: Right;   NEPHROLITHOTOMY Left 10/24/2017   Procedure: NEPHROLITHOTOMY PERCUTANEOUS;  Surgeon: [name]  L, MD;  [facility];  Service: Urology;  Laterality: Left;   NEPHROLITHOTOMY Left 03/26/2019   Procedure: NEPHROLITHOTOMY PERCUTANEOUS;  Surgeon: Cleon Gustin, MD;  [facility];  Service: Urology;  Laterality: Left;  2 HRS   NEPHROLITHOTOMY Left 04/17/2019   Procedure: NEPHROLITHOTOMY PERCUTANEOUS;  Surgeon: Cleon Gustin, MD;  [facility];  Service: Urology;  Laterality: Left;  2 HRS   ORIF TIBIA FRACTURE Right 02/06/2013   Procedure: OPEN REDUCTION INTERNAL FIXATION (ORIF) TIBIA FRACTURE WITH IM ROD FIBULA;  Surgeon: Rozanna Box, MD;  Location: [place];  Service: Orthopedics;  Laterality: Right;   ORIF TIBIA FRACTURE Right 07/03/2013   Procedure: RIGHT TIBIA NON UNION REPAIR ;  Surgeon: Rozanna Box, MD;  Location: [place];  Service: Orthopedics;  Laterality: Right;   ORIF WRIST FRACTURE  10/02/2012   Procedure: OPEN REDUCTION INTERNAL FIXATION (ORIF) WRIST FRACTURE;  Surgeon: Roseanne Kaufman, MD;  [facility];  Service: Orthopedics;  Laterality: Right;  WITH   ANTIBIOTIC  CEMENT   ORIF WRIST FRACTURE Left 10/28/2013   Procedure: OPEN REDUCTION INTERNAL FIXATION (ORIF) WRIST FRACTURE with allograft;  Surgeon: Roseanne Kaufman, MD;  [facility];  Service: Orthopedics;  Laterality: Left;  DVR Plate   QUADRICEPS TENDON REPAIR Left 07/15/2017   Procedure: REPAIR QUADRICEP TENDON;  Surgeon:  Frederik Pear, MD;  Location: [place];  Service: Orthopedics;  Laterality: Left;   RIGHT/LEFT HEART CATH AND CORONARY ANGIOGRAPHY N/A 08/13/2019   Procedure: RIGHT/LEFT HEART CATH AND CORONARY ANGIOGRAPHY;  Surgeon: Nelva Bush, MD;  Location: [place] [facility];  Service: Cardiovascular;  Laterality: N/A;   THYROIDECTOMY  02/1986   TOTAL HIP ARTHROPLASTY Right 03-16-2016   '@[facility]'$    TOTAL HIP REVISION  10/05/2012   Procedure: TOTAL HIP REVISION;  Surgeon: [name], MD;  [facility];  Service: Orthopedics;  Laterality: Right;  RIGHT TOTAL HIP REVISION   TOTAL HIP REVISION Right 09/17/2013   Procedure: REVISION RIGHT TOTAL HIP ARTHROPLASTY ;  Surgeon: [name], MD;  [facility];  Service: Orthopedics;  Laterality: Right;   TOTAL HIP REVISION Right 10/26/2013   Procedure: REVISION RIGHT TOTAL HIP ARTHROPLASTY;  Surgeon: [name], MD;  [facility];  Service: Orthopedics;  Laterality: Right;   TOTAL KNEE ARTHROPLASTY Bilateral right 03-15-2011;  left 06-30-2011   TOTAL KNEE REVISION Left 04/11/2017   Procedure: TOTAL KNEE REVISION PATELLA and TIBIA;  Surgeon: Frederik Pear, MD;  Location: [place];  Service: Orthopedics;  Laterality: Left;   Social History   Tobacco Use  Smoking Status Former   Packs/day: 1.00   Years: 50.00   Pack years: 50.00   Types: Cigarettes   Quit date: 01/12/2012   Years since quitting: 9.4  Smokeless Tobacco Never  Total wt loss: 12.6 pounds Inches lost: 4 Workouts complete: 11 Education sessions completed: [address] job!!   [place] 06/18/2021, 3:19 PM

## 2021-06-22 ENCOUNTER — Other Ambulatory Visit: Payer: Self-pay | Admitting: Cardiovascular Disease

## 2021-06-23 DIAGNOSIS — J301 Allergic rhinitis due to pollen: Secondary | ICD-10-CM | POA: Diagnosis not present

## 2021-06-23 DIAGNOSIS — J3089 Other allergic rhinitis: Secondary | ICD-10-CM | POA: Diagnosis not present

## 2021-06-25 DIAGNOSIS — J3089 Other allergic rhinitis: Secondary | ICD-10-CM | POA: Diagnosis not present

## 2021-06-25 DIAGNOSIS — J301 Allergic rhinitis due to pollen: Secondary | ICD-10-CM | POA: Diagnosis not present

## 2021-06-29 ENCOUNTER — Other Ambulatory Visit: Payer: Self-pay | Admitting: *Deleted

## 2021-06-29 MED ORDER — TIZANIDINE HCL 4 MG PO TABS: ORAL_TABLET | ORAL | 1 refills | Status: DC

## 2021-06-29 MED ORDER — RIVAROXABAN 10 MG PO TABS: 10.0000 mg | ORAL_TABLET | Freq: Every day | ORAL | 3 refills | Status: DC

## 2021-07-07 DIAGNOSIS — F331 Major depressive disorder, recurrent, moderate: Secondary | ICD-10-CM | POA: Diagnosis not present

## 2021-07-07 DIAGNOSIS — F410 Panic disorder [episodic paroxysmal anxiety] without agoraphobia: Secondary | ICD-10-CM | POA: Diagnosis not present

## 2021-07-08 ENCOUNTER — Encounter: Payer: Self-pay | Admitting: Internal Medicine

## 2021-07-08 ENCOUNTER — Other Ambulatory Visit: Payer: Self-pay

## 2021-07-08 ENCOUNTER — Ambulatory Visit: Payer: Medicare PPO | Admitting: Internal Medicine

## 2021-07-08 VITALS — BP 132/70 | HR 69 | Temp 97.7°F | Ht 75.0 in | Wt 290.2 lb

## 2021-07-08 DIAGNOSIS — M79605 Pain in left leg: Secondary | ICD-10-CM | POA: Diagnosis not present

## 2021-07-08 DIAGNOSIS — M545 Low back pain, unspecified: Secondary | ICD-10-CM

## 2021-07-08 DIAGNOSIS — I10 Essential (primary) hypertension: Secondary | ICD-10-CM | POA: Diagnosis not present

## 2021-07-08 DIAGNOSIS — M79604 Pain in right leg: Secondary | ICD-10-CM | POA: Diagnosis not present

## 2021-07-08 DIAGNOSIS — I5032 Chronic diastolic (congestive) heart failure: Secondary | ICD-10-CM | POA: Diagnosis not present

## 2021-07-08 DIAGNOSIS — I2510 Atherosclerotic heart disease of native coronary artery without angina pectoris: Secondary | ICD-10-CM | POA: Diagnosis not present

## 2021-07-08 DIAGNOSIS — Z23 Encounter for immunization: Secondary | ICD-10-CM

## 2021-07-08 NOTE — Assessment & Plan Note (Signed)
Cont w/Xarelto, Zetia, Imdur

## 2021-07-08 NOTE — Assessment & Plan Note (Signed)
Cont on Losartan °

## 2021-07-08 NOTE — Assessment & Plan Note (Signed)
In PREP program - doing well

## 2021-07-08 NOTE — Progress Notes (Signed)
Subjective:  Patient ID: [name], male    DOB: November 03, 1945  Age: 75 y.o. MRN: [id number]  CC: Follow-up (3 month f/u- flu shot)   [name] presents for chronic pain, CHF, anxiety [facility]  Outpatient Medications Prior to Visit  Medication Sig Dispense Refill   acetaminophen (TYLENOL) 500 MG tablet Take 1,000 mg by mouth daily as needed for moderate pain.     alfuzosin (UROXATRAL) 10 MG 24 hr tablet TAKE 1 TABLET AT BEDTIME 90 tablet 3   amoxicillin (AMOXIL) 500 MG capsule Take 2,000 mg by mouth See admin instructions. Take 4 capsules (2000 mg) by mouth 1 hour prior to dental work     aspirin EC 81 MG tablet Take 81 mg by mouth daily.     BELSOMRA 15 MG TABS TAKE 1 TABLET BY MOUTH  DAILY (Patient taking differently: Take 1 tablet by mouth at bedtime as needed.) 90 tablet 1   benzonatate (TESSALON) 100 MG capsule Take 1 capsule (100 mg total) by mouth every 8 (eight) hours. 21 capsule 0   buPROPion (WELLBUTRIN XL) 300 MG 24 hr tablet TAKE 1 TABLET EVERY DAY WITH BREAKFAST 90 tablet 3   Calcium Citrate-Vitamin D (CALCIUM CITRATE + D PO) Take 1 tablet by mouth 2 (two) times daily.      Cholecalciferol (VITAMIN D-3 PO) Take 2,000 Units by mouth 2 (two) times daily.      clonazePAM (KLONOPIN) 1 MG tablet TAKE 1 TABLET EVERY 6 HOURS AS NEEDED FOR ANXIETY 120 tablet 3   denosumab (PROLIA) 60 MG/ML SOSY injection Inject 60 mg as directed every 6 (six) months. Last injection: Feb 2020 Next injection: August 2020     diltiazem (CARDIZEM CD) 240 MG 24 hr capsule TAKE 1 CAPSULE EVERY DAY 90 capsule 3   DULoxetine (CYMBALTA) 60 MG capsule TAKE 1 CAPSULE TWICE DAILY 180 capsule 3   ezetimibe (ZETIA) 10 MG tablet TAKE 1 TABLET EVERY DAY. 90 tablet 3   ferrous sulfate 325 (65 FE) MG tablet Take 325 mg by mouth 2 (two) times daily with a meal.      hydrochlorothiazide (HYDRODIURIL) 12.5 MG tablet TAKE 1 TABLET (12.5 MG TOTAL) BY MOUTH DAILY. 90  tablet 3   HYDROcodone-acetaminophen (NORCO) 7.5-325 MG tablet Take 1 tablet by mouth every 6 (six) hours as needed for severe pain. 120 tablet 0   isosorbide mononitrate (IMDUR) 30 MG 24 hr tablet Take 1 tablet (30 mg total) by mouth daily. 90 tablet 3   levothyroxine (SYNTHROID) 200 MCG tablet TAKE 1 TABLET EVERY DAY AT 6AM 90 tablet 3   loratadine (CLARITIN) 10 MG tablet Take 10 mg by mouth daily.      losartan (COZAAR) 50 MG tablet TAKE 1 TABLET EVERY DAY 90 tablet 2   Magnesium 500 MG CAPS Take 1 capsule (500 mg total) by mouth daily. (Patient taking differently: Take 500 mg by mouth every evening.) 30 capsule 1   metoprolol tartrate (LOPRESSOR) 25 MG tablet Take 1 tablet (25 mg total) by mouth 2 (two) times daily. 180 tablet 3   mirabegron ER (MYRBETRIQ) 25 MG TB24 tablet Take 25 mg by mouth at bedtime.      Multiple Vitamin (MULTIVITAMIN WITH MINERALS) TABS tablet Take 1 tablet by mouth daily. Men's One-A-Day 50+     nitroGLYCERIN (NITROSTAT) 0.4 MG SL tablet Place 0.4 mg under the tongue every 5 (five) minutes as needed for  chest pain. x3 doses as needed for chest pain     omeprazole (PRILOSEC) 40 MG capsule TAKE 1 CAPSULE EVERY DAY BEFORE BREAKFAST 90 capsule 3   Potassium Citrate 15 MEQ (1620 MG) TBCR TAKE 1 TABLET TWICE DAILY 180 tablet 3   Probiotic Product (PROBIOTIC PO) Take 1 capsule by mouth daily with breakfast.     Risankizumab-rzaa (SKYRIZI PEN [place]) Inject into the skin.     rivaroxaban (XARELTO) 10 MG TABS tablet Take 1 tablet (10 mg total) by mouth daily. 90 tablet 3   testosterone enanthate (DELATESTRYL) 200 MG/ML injection INJECT 1ML (200MG ) INTO THE MUSCLE EVERY 14 DAYS. DISCARD VIAL AFTER 28 DAYS. 5 mL 3   tiZANidine (ZANAFLEX) 4 MG tablet TAKE 1 TABLET EVERY 6 HOURS AS NEEDED FOR MUSCLE SPASM(S) 360 tablet 1   vitamin C (ASCORBIC ACID) 500 MG tablet Take 500 mg by mouth daily.     No facility-administered medications prior to visit.    ROS: Review of Systems   Constitutional:  Negative for appetite change, fatigue and unexpected weight change.  HENT:  Negative for congestion, nosebleeds, sneezing, sore throat and trouble swallowing.   Eyes:  Negative for itching and visual disturbance.  Respiratory:  Negative for cough.   Cardiovascular:  Negative for chest pain, palpitations and leg swelling.  Gastrointestinal:  Negative for abdominal distention, blood in stool, diarrhea and nausea.  Genitourinary:  Negative for frequency and hematuria.  Musculoskeletal:  Positive for arthralgias, back pain and gait problem. Negative for joint swelling and neck pain.  Skin:  Negative for rash.  Neurological:  Negative for dizziness, tremors, speech difficulty and weakness.  Psychiatric/Behavioral:  Negative for agitation, dysphoric mood and sleep disturbance. The patient is not nervous/anxious.    Objective:  BP 132/70 (BP Location: Left Arm)   Pulse 69   Temp 97.7 F (36.5 C) (Oral)   Ht 6\' 3"  (1.905 m)   Wt 290 lb 3.2 oz (131.6 kg)   SpO2 96%   BMI 36.27 kg/m   BP Readings from Last 3 Encounters:  07/08/21 132/70  06/18/21 124/62  06/08/21 139/68    Wt Readings from Last 3 Encounters:  07/08/21 290 lb 3.2 oz (131.6 kg)  06/18/21 295 lb (133.8 kg)  06/16/21 298 lb (135.2 kg)    Physical Exam Constitutional:      General: He is not in acute distress.    Appearance: He is well-developed. He is obese.     Comments: NAD  Eyes:     Conjunctiva/sclera: Conjunctivae normal.     Pupils: Pupils are equal, round, and reactive to light.  Neck:     Thyroid: No thyromegaly.     Vascular: No JVD.  Cardiovascular:     Rate and Rhythm: Normal rate and regular rhythm.     Heart sounds: Normal heart sounds. No murmur heard.   No friction rub. No gallop.  Pulmonary:     Effort: Pulmonary effort is normal. No respiratory distress.     Breath sounds: Normal breath sounds. No wheezing or rales.  Chest:     Chest wall: No tenderness.  Abdominal:      General: Bowel sounds are normal. There is no distension.     Palpations: Abdomen is soft. There is no mass.     Tenderness: no abdominal tenderness There is no guarding or rebound.  Musculoskeletal:        General: No tenderness. Normal range of motion.     Cervical back: Normal range of  motion.  Lymphadenopathy:     Cervical: No cervical adenopathy.  Skin:    General: Skin is warm and dry.     Findings: No rash.  Neurological:     Mental Status: He is alert and oriented to person, place, and time.     Cranial Nerves: No cranial nerve deficit.     Motor: No abnormal muscle tone.     Coordination: Coordination abnormal.     Gait: Gait abnormal.     Deep Tendon Reflexes: Reflexes are normal and symmetric.  Psychiatric:        Behavior: Behavior normal.        Thought Content: Thought content normal.        Judgment: Judgment normal.   Using a walker Antalgic gait  Lab Results  Component Value Date   WBC 6.5 06/08/2021   HGB 17.4 (H) 06/08/2021   HCT 52.3 (H) 06/08/2021   PLT 130 (L) 06/08/2021   GLUCOSE 141 (H) 06/08/2021   CHOL 101 02/24/2021   TRIG 91 02/24/2021   HDL 47 02/24/2021   LDLCALC 36 02/24/2021   ALT 42 06/08/2021   AST 27 06/08/2021   NA 140 06/08/2021   K 4.7 06/08/2021   CL 104 06/08/2021   CREATININE 1.38 (H) 06/08/2021   BUN 26 (H) 06/08/2021   CO2 29 06/08/2021   TSH 0.02 Repeated and verified X2. (L) 07/08/2020   PSA 1.08 06/26/2020   INR 1.0 03/26/2019   HGBA1C 5.8 (H) 02/06/2013    DG Chest Port 1 View  Result Date: 05/19/2021 CLINICAL DATA:  Dyspnea, history of CHF and shortness of breath EXAM: PORTABLE CHEST 1 VIEW COMPARISON:  Chest radiograph 06/15/2019 FINDINGS: A right-sided central venous catheter is in place terminating in the SVC. The heart is enlarged. The mediastinal contours are within normal limits. There is vascular congestion with interstitial prominence suggesting pulmonary interstitial edema. There may be trace bilateral  pleural effusions. There is no focal consolidation. There is no pneumothorax. There is a 1.0 cm nodular opacity projecting over the left upper lobe not seen on the prior study. There is no acute osseous abnormality. IMPRESSION: 1. Cardiomegaly with mild pulmonary interstitial edema and possible trace bilateral pleural effusions. 2. 1.0 cm nodular opacity projecting over the left upper lobe not seen on the prior study. This may be artifactual; however, recommend nonemergent CT chest to exclude a true parenchymal nodule Electronically Signed   By: Valetta Mole MD   On: 05/19/2021 09:55    Assessment & Plan:   Problem List Items Addressed This Visit     Low back pain radiating to both legs (Chronic)    Norco prn  Potential benefits of a long term opioids use as well as potential risks (i.e. addiction risk, apnea etc) and complications (i.e. Somnolence, constipation and others) were explained to the patient and were aknowledged.      CAD (coronary artery disease)    Cont w/Xarelto, Zetia, Imdur      Chronic diastolic heart failure ([place])    In PREP program - doing well      Essential hypertension    Cont on Losartan      Other Visit Diagnoses     Needs flu shot    -  Primary   Relevant Orders   Flu Vaccine QUAD High Dose(Fluad) (Completed)         Follow-up: Return in about 3 months (around 10/07/2021) for a follow-up visit.  Walker Kehr, MD

## 2021-07-08 NOTE — Assessment & Plan Note (Signed)
Norco prn  Potential benefits of a long term opioids use as well as potential risks (i.e. addiction risk, apnea etc) and complications (i.e. Somnolence, constipation and others) were explained to the patient and were aknowledged. 

## 2021-07-10 DIAGNOSIS — N401 Enlarged prostate with lower urinary tract symptoms: Secondary | ICD-10-CM | POA: Diagnosis not present

## 2021-07-10 DIAGNOSIS — R350 Frequency of micturition: Secondary | ICD-10-CM | POA: Diagnosis not present

## 2021-07-14 ENCOUNTER — Telehealth: Payer: Self-pay

## 2021-07-14 NOTE — Chronic Care Management (AMB) (Signed)
Chronic Care Management Pharmacy Assistant   Name: [name]  MRN: [id number] DOB: 07/13/1946  [name] is an 75 y.o. year old male who presents for his initial CCM visit with the clinical pharmacist.  Recent office visits:  07/08/21-Aleksei V. Plotnikov, MD (PCP) 3 month follow up visit. Flu vaccine given. Follow up in 3 months. 04/02/21-Aleksei V. Plotnikov, MD (PCP) 3 month follow up visit. Follow up in 3 months.  Recent consult visits:  06/08/21-Gautam Juleen China, MD (Oncology) Follow up visit for diffuse large B-cell lymphoma. Starting a PREP program. Follow in 6 months with labs. 04/01/21-Tiffany Oval Linsey, MD (Cardiology) Cardiac general follow up. Follow up in 6 months. 03/23/21-Maxwell Carles Collet, MD (Orthopedic surgery) Seen for post-operative follow up visit. Ordered. Amoxicillin 500 mg take 4 cap 1 hour prior to dental appointment. Follow up with the clinic for clinical and radiographic evaluation per total joint protocol 3 years.  02/10/21-Tiffany Oval Linsey, MD (Cardiology) 6 month follow up. Follow up in 6-8 weeks.  Hospital visits:  Medication Reconciliation was completed by comparing discharge summary, patient's EMR and Pharmacy list, and upon discussion with patient.  Admitted to the hospital on 05/19/21 due to shortness of breath. Discharge date was 05/19/21. Discharged from [facility]?Medications Started at [facility] Discharge:?? -started  Doxycycline 100 mg cap twice daily Prednisone 20 mg daily Benzonatate 100 mg every 8 hours  Medication Changes at Hospital Discharge: -Changed None noted  Medications Discontinued at Hospital Discharge: -Stopped None noted  Medications that remain the same after Hospital Discharge:??  -All other medications will remain the same.    Medications: Outpatient Encounter Medications as of 07/14/2021  Medication Sig Note   acetaminophen (TYLENOL) 500 MG tablet Take 1,000 mg by mouth  daily as needed for moderate pain.    alfuzosin (UROXATRAL) 10 MG 24 hr tablet TAKE 1 TABLET AT BEDTIME    amoxicillin (AMOXIL) 500 MG capsule Take 2,000 mg by mouth See admin instructions. Take 4 capsules (2000 mg) by mouth 1 hour prior to dental work    aspirin EC 81 MG tablet Take 81 mg by mouth daily.    BELSOMRA 15 MG TABS TAKE 1 TABLET BY MOUTH  DAILY (Patient taking differently: Take 1 tablet by mouth at bedtime as needed.)    benzonatate (TESSALON) 100 MG capsule Take 1 capsule (100 mg total) by mouth every 8 (eight) hours.    buPROPion (WELLBUTRIN XL) 300 MG 24 hr tablet TAKE 1 TABLET EVERY DAY WITH BREAKFAST    Calcium Citrate-Vitamin D (CALCIUM CITRATE + D PO) Take 1 tablet by mouth 2 (two) times daily.     Cholecalciferol (VITAMIN D-3 PO) Take 2,000 Units by mouth 2 (two) times daily.     clonazePAM (KLONOPIN) 1 MG tablet TAKE 1 TABLET EVERY 6 HOURS AS NEEDED FOR ANXIETY    denosumab (PROLIA) 60 MG/ML SOSY injection Inject 60 mg as directed every 6 (six) months. Last injection: Feb 2020 Next injection: August 2020 08/07/2019: Next dose due:11/2019   diltiazem (CARDIZEM CD) 240 MG 24 hr capsule TAKE 1 CAPSULE EVERY DAY    DULoxetine (CYMBALTA) 60 MG capsule TAKE 1 CAPSULE TWICE DAILY    ezetimibe (ZETIA) 10 MG tablet TAKE 1 TABLET EVERY DAY.    ferrous sulfate 325 (65 FE) MG tablet Take 325 mg by mouth 2 (two) times daily with a meal.     hydrochlorothiazide (HYDRODIURIL) 12.5 MG tablet TAKE 1 TABLET (12.5 MG TOTAL) BY MOUTH DAILY.  HYDROcodone-acetaminophen (NORCO) 7.5-325 MG tablet Take 1 tablet by mouth every 6 (six) hours as needed for severe pain.    isosorbide mononitrate (IMDUR) 30 MG 24 hr tablet Take 1 tablet (30 mg total) by mouth daily.    levothyroxine (SYNTHROID) 200 MCG tablet TAKE 1 TABLET EVERY DAY AT 6AM    loratadine (CLARITIN) 10 MG tablet Take 10 mg by mouth daily.     losartan (COZAAR) 50 MG tablet TAKE 1 TABLET EVERY DAY    Magnesium 500 MG CAPS Take 1 capsule  (500 mg total) by mouth daily. (Patient taking differently: Take 500 mg by mouth every evening.)    metoprolol tartrate (LOPRESSOR) 25 MG tablet Take 1 tablet (25 mg total) by mouth 2 (two) times daily.    mirabegron ER (MYRBETRIQ) 25 MG TB24 tablet Take 25 mg by mouth at bedtime.     Multiple Vitamin (MULTIVITAMIN WITH MINERALS) TABS tablet Take 1 tablet by mouth daily. Men's One-A-Day 50+    nitroGLYCERIN (NITROSTAT) 0.4 MG SL tablet Place 0.4 mg under the tongue every 5 (five) minutes as needed for chest pain. x3 doses as needed for chest pain 10/17/2019: Per pt has never one since filled Rx   omeprazole (PRILOSEC) 40 MG capsule TAKE 1 CAPSULE EVERY DAY BEFORE BREAKFAST    Potassium Citrate 15 MEQ (1620 MG) TBCR TAKE 1 TABLET TWICE DAILY    Probiotic Product (PROBIOTIC PO) Take 1 capsule by mouth daily with breakfast.    Risankizumab-rzaa (SKYRIZI PEN [place]) Inject into the skin.    rivaroxaban (XARELTO) 10 MG TABS tablet Take 1 tablet (10 mg total) by mouth daily.    testosterone enanthate (DELATESTRYL) 200 MG/ML injection INJECT 1ML (200MG ) INTO THE MUSCLE EVERY 14 DAYS. DISCARD VIAL AFTER 28 DAYS.    tiZANidine (ZANAFLEX) 4 MG tablet TAKE 1 TABLET EVERY 6 HOURS AS NEEDED FOR MUSCLE SPASM(S)    vitamin C (ASCORBIC ACID) 500 MG tablet Take 500 mg by mouth daily.    No facility-administered encounter medications on file as of 07/14/2021.   Acetaminophen (TYLENOL) 500 MG tablet Last filled:None noted Alfuzosin (UROXATRAL) 10 MG 24 hr tablet Last filled:03/22/21 90 DS Amoxicillin (AMOXIL) 500 MG capsule Last filled:03/23/21 30 DS Aspirin EC 81 MG tablet Last filled:None noted BELSOMRA 15 MG TABS Last filled:None noted Benzonatate (TESSALON) 100 MG capsule Last filled:None noted BuPROPion (WELLBUTRIN XL) 300 MG 24 hr tablet Last filled:03/19/21 90 DS Calcium Citrate-Vitamin D (CALCIUM CITRATE + D PO) Last filled:None noted Cholecalciferol (VITAMIN D-3 PO) Last filled:None noted ClonazePAM  (KLONOPIN) 1 MG tablet Last filled:03/05/21 85 DS  Denosumab (PROLIA) 60 MG/ML SOSY injection Last filled:None noted Diltiazem (CARDIZEM CD) 240 MG 24 hr capsule Last filled:04/09/21 90 DS DULoxetine (CYMBALTA) 60 MG capsule Last filled:04/30/21 90 DS Ezetimibe (ZETIA) 10 MG tablet Last filled:02/13/21 90 DS Ferrous sulfate 325 (65 FE) MG tablet Last filled:None noted Hydrochlorothiazide (HYDRODIURIL) 12.5 MG tablet Last filled:03/28/21 90 DS HYDROcodone-acetaminophen (NORCO) 7.5-325 MG tablet Last filled:09/30/20 30 DS Isosorbide mononitrate (IMDUR) 30 MG 24 hr tablet Last filled:01/31/21 90 DS Levothyroxine (SYNTHROID) 200 MCG tablet Last filled:03/23/21 90 DS Loratadine (CLARITIN) 10 MG tablet Last filled:None noted Losartan (COZAAR) 50 MG tablet Last filled:03/28/21 90 DS Magnesium 500 MG CAPS Last filled:None noted Metoprolol tartrate (LOPRESSOR) 25 MG tablet Last filled:02/21/21 90 DS Mirabegron ER (MYRBETRIQ) 25 MG TB24 tablet Last filled:08/12/20 90 DS NitroGLYCERIN (NITROSTAT) 0.4 MG SL tablet Last filled:None noted Omeprazole (PRILOSEC) 40 MG capsule Last filled:03/14/21 90 DS Potassium Citrate 15 MEQ (1620  MG) TBCR Last filled:04/09/21 90 DS Risankizumab-rzaa (SKYRIZI PEN [place]) Last filled:03/12/21 84 DS Rivaroxaban (XARELTO) 10 MG TABS tablet Last filled:03/19/21 90 DS Testosterone enanthate (DELATESTRYL) 200 MG/ML injection Last filled:02/06/21 28 DS TiZANidine (ZANAFLEX) 4 MG tablet Last filled:11/27/20 90 DS Vitamin C (ASCORBIC ACID) 500 MG tablet Last filled:None noted   Care Gaps: Zoster Vaccines- Shingrix:Never done COLONOSCOPY:Never done COVID-19 Vaccine:Last completed: Jun 05, 2020  Star Rating Drugs: Losartan (COZAAR) 50 MG tablet Last filled:03/28/21 90 DS  Myriam Elta [place], [place]

## 2021-07-15 ENCOUNTER — Ambulatory Visit (INDEPENDENT_AMBULATORY_CARE_PROVIDER_SITE_OTHER): Payer: Medicare PPO

## 2021-07-15 ENCOUNTER — Ambulatory Visit: Payer: Medicare PPO

## 2021-07-15 ENCOUNTER — Other Ambulatory Visit: Payer: Self-pay

## 2021-07-15 DIAGNOSIS — E039 Hypothyroidism, unspecified: Secondary | ICD-10-CM

## 2021-07-15 DIAGNOSIS — M545 Low back pain, unspecified: Secondary | ICD-10-CM

## 2021-07-15 DIAGNOSIS — I5032 Chronic diastolic (congestive) heart failure: Secondary | ICD-10-CM

## 2021-07-15 DIAGNOSIS — I480 Paroxysmal atrial fibrillation: Secondary | ICD-10-CM

## 2021-07-15 DIAGNOSIS — I10 Essential (primary) hypertension: Secondary | ICD-10-CM

## 2021-07-15 DIAGNOSIS — Z7901 Long term (current) use of anticoagulants: Secondary | ICD-10-CM

## 2021-07-15 DIAGNOSIS — I2510 Atherosclerotic heart disease of native coronary artery without angina pectoris: Secondary | ICD-10-CM

## 2021-07-15 NOTE — Progress Notes (Addendum)
Chronic Care Management Pharmacy Note  07/15/2021 Name:  [name] MRN:  [id number] DOB:  01-06-1946  Summary: -spoke with patient's spouse [name] who manages his medications - notes that patient has been doing well as of late, has not current issues or concerns at this time -Does not have a blood pressure cuff at home to help monitor BP, does report that patient does not monitor sodium intake / does not watch cholesterol intake -Has recently started at [employer] program - increasing activity, notes that he has to use albuterol inhaler on higher intensity workout days, otherwise he is not having any issues with breathing  -Pain is well controlled on norco, tizanidine, and APAP regimen -Noted that depression and anxiety are well controlled on current medications, follows with [facility] at least once monthly  -No issues with abnormal bruising on bleeding on current Xarelto dosing  -will reach out to PCP for confirmation on dosing   Recommendations/Changes made from today's visit: -Recommending no changes to current medications , advised for patient to reduce sodium intake to help better control BP/ CHF, and advised for patient to reduce high cholesterol foods to help keep cholesterol under control -Would recommend for patient to have updated TSH level with next PCP appointment, adjust levothyroxine dosing if necessary -Reviewed with patient/ patient spouse safety concerns with use of opioid/benzodiazepine, and muscle relaxer - emphasized extreme caution be taken with these medications, and only taking when needed   Subjective: [name] is an 75 y.o. year old male who is a primary patient of Lane, Joseph Lacks, MD.  The CCM team was consulted for assistance with disease management and care coordination needs.    Engaged with patient by telephone for initial visit in response to provider referral for pharmacy case management and/or care coordination services.    Consent to Services:  The patient was given the following information about Chronic Care Management services today, agreed to services, and gave verbal consent: 1. CCM service includes personalized support from designated clinical staff supervised by the primary care provider, including individualized plan of care and coordination with other care providers 2. 24/7 contact phone numbers for assistance for urgent and routine care needs. 3. Service will only be billed when office clinical staff spend 20 minutes or more in a month to coordinate care. 4. Only one practitioner may furnish and bill the service in a calendar month. 5.The patient may stop CCM services at any time (effective at the end of the month) by phone call to the office staff. 6. The patient will be responsible for cost sharing (co-pay) of up to 20% of the service fee (after annual deductible is met). Patient agreed to services and consent obtained.  Patient Care Team: Lane, Joseph Lacks, MD as PCP - General (Internal Medicine) Joseph Latch, MD as PCP - Cardiology (Cardiology) Joseph Genera, MD as Consulting Physician (Hematology) McKenzie, Joseph Furbish, MD as Consulting Physician (Urology) Joseph Denmark, MD as Consulting Physician (Gastroenterology) Joseph Latch, MD as Attending Physician (Cardiology) [name], [place] as Consulting Physician (Optometry) [name], Darnelle Maffucci, [facility] as Pharmacist (Pharmacist)  Recent office visits:  07/08/21-Joseph V. Plotnikov, MD (PCP) 3 month follow up visit. Flu vaccine given. Follow up in 3 months. 04/02/21-Joseph V. Plotnikov, MD (PCP) 3 month follow up visit. Follow up in 3 months.   Recent consult visits:  06/08/21-Joseph Juleen China, MD (Oncology) Follow up visit for diffuse large B-cell lymphoma. no changes at this time. Follow in 6  months with labs. 04/01/21-Joseph Oval Linsey, MD (Cardiology) Cardiac general follow up. Follow up in 6 months. 03/23/21-Joseph Carles Collet, MD (Orthopedic surgery) Seen for post-operative follow up visit (5 years post status Right revision THA. Ordered. Amoxicillin 500 mg take 4 cap 1 hour prior to dental appointment. Follow up with the clinic for clinical and radiographic evaluation per total joint protocol 3 years.  02/10/21-Joseph Oval Linsey, MD (Cardiology) 6 month follow up. Follow up in 6-8 weeks.   Hospital visits:  Medication Reconciliation was completed by comparing discharge summary, patient's EMR and Pharmacy list, and upon discussion with patient.   Admitted to the hospital on 05/19/21 due to shortness of breath/ cough - dx of COPD exacerbation. Discharge date was 05/19/21. Discharged from [place]?Medications Started at [facility] Discharge:?? -started  Doxycycline 100 mg cap twice daily Prednisone 20 mg daily Benzonatate 100 mg every 8 hours   Medication Changes at Hospital Discharge: -Changed None noted   Medications Discontinued at Hospital Discharge: -Stopped None noted   Medications that remain the same after Hospital Discharge:??  -All other medications will remain the same.    Objective:  Lab Results  Component Value Date   CREATININE 1.38 (H) 06/08/2021   BUN 26 (H) 06/08/2021   GFR 69.01 10/02/2018   GFRNONAA 53 (L) 06/08/2021   GFRAA 55 (L) 06/05/2020   NA 140 06/08/2021   K 4.7 06/08/2021   CALCIUM 9.3 06/08/2021   CO2 29 06/08/2021   GLUCOSE 141 (H) 06/08/2021    Lab Results  Component Value Date/Time   HGBA1C 5.8 (H) 02/06/2013 05:46 AM   HGBA1C (H) 03/15/2011 06:06 AM    6.4 (NOTE)                                                                       According to the ADA Clinical Practice Recommendations for 2011, when HbA1c is used as a screening test:   >=6.5%   Diagnostic of Diabetes Mellitus           (if abnormal result  is confirmed)  5.7-6.4%   Increased risk of developing Diabetes Mellitus  References:Diagnosis and Classification of Diabetes  [place],Diabetes HFWY,6378,58(IFOYD 1):S62-S69 and Standards of Medical Care in         Diabetes - 2011,Diabetes Care,2011,34  (Suppl 1):S11-S61.   GFR 69.01 10/02/2018 11:14 AM   GFR 59.18 (L) 02/20/2018 10:53 AM    Last diabetic Eye exam:  No results found for: HMDIABEYEEXA  Last diabetic Foot exam:  No results found for: HMDIABFOOTEX   Lab Results  Component Value Date   CHOL 101 02/24/2021   HDL 47 02/24/2021   LDLCALC 36 02/24/2021   TRIG 91 02/24/2021   CHOLHDL 2.1 02/24/2021    Hepatic Function Latest Ref Rng & Units 06/08/2021 02/24/2021 12/08/2020  Total Protein 6.5 - 8.1 g/dL 6.5 6.7 7.0  Albumin 3.5 - 5.0 g/dL 3.4(L) 3.9 3.5  AST 15 - 41 U/L '27 26 27  ' ALT 0 - 44 U/L 42 29 39  Alk Phosphatase 38 - 126 U/L 61 45 71  Total Bilirubin 0.3 - 1.2 mg/dL 1.2 0.9 0.9  Bilirubin, Direct 0.0 - 0.2 mg/dL - - -    Lab Results  Component Value Date/Time   TSH 0.02 Repeated and verified X2. (L) 07/08/2020 11:39 AM   TSH 0.02 (L) 06/26/2020 11:12 AM   FREET4 1.62 (H) 07/08/2020 11:39 AM    CBC Latest Ref Rng & Units 06/08/2021 05/19/2021 12/08/2020  WBC 4.0 - 10.5 K/uL 6.5 8.8 7.2  Hemoglobin 13.0 - 17.0 g/dL 17.4(H) 14.8 16.6  Hematocrit 39.0 - 52.0 % 52.3(H) 44.7 51.3  Platelets 150 - 400 K/uL 130(L) 134(L) 158    Lab Results  Component Value Date/Time   VD25OH 56.45 08/16/2017 03:07 PM   VD25OH 35 02/06/2013 05:46 AM    Clinical ASCVD: No  The ASCVD Risk score (Arnett DK, et al., 2019) failed to calculate for the following reasons:   The valid total cholesterol range is 130 to 320 mg/dL    Depression screen [facility] 2/9 07/08/2021 04/02/2021 12/31/2020  Decreased Interest 0 0 1  Down, Depressed, Hopeless 0 1 0  PHQ - 2 Score 0 1 1  Altered sleeping '2 1 2  ' Tired, decreased energy '1 1 1  ' Change in appetite 0 0 0  Feeling bad or failure about yourself  0 0 0  Trouble concentrating 0 0 0  Moving slowly or fidgety/restless 0 0 0  Suicidal thoughts 0 0 0  PHQ-9 Score '3 3 4   ' Difficult doing work/chores Not difficult at all Not difficult at all Not difficult at all  Some recent data might be hidden   Social History   Tobacco Use  Smoking Status Former   Packs/day: 1.00   Years: 50.00   Pack years: 50.00   Types: Cigarettes   Quit date: 01/12/2012   Years since quitting: 9.5  Smokeless Tobacco Never   BP Readings from Last 3 Encounters:  07/08/21 132/70  06/18/21 124/62  06/08/21 139/68   Pulse Readings from Last 3 Encounters:  07/08/21 69  06/18/21 66  06/08/21 90   Wt Readings from Last 3 Encounters:  07/08/21 290 lb 3.2 oz (131.6 kg)  06/18/21 295 lb (133.8 kg)  06/16/21 298 lb (135.2 kg)   BMI Readings from Last 3 Encounters:  07/08/21 36.27 kg/m  06/18/21 36.87 kg/m  06/16/21 37.25 kg/m    Assessment/Interventions: Review of patient past medical history, allergies, medications, health status, including review of consultants reports, laboratory and other test data, was performed as part of comprehensive evaluation and provision of chronic care management services.   SDOH:  (Social Determinants of Health) assessments and interventions performed: Yes  SDOH Screenings   Alcohol Screen: Low Risk    Last Alcohol Screening Score (AUDIT): 0  Depression (PHQ2-9): Low Risk    PHQ-2 Score: 3  Financial Resource Strain: Low Risk    Difficulty of Paying Living Expenses: Not hard at all  Food Insecurity: No Food Insecurity   Worried About Charity fundraiser in the Last Year: Never true   Ran Out of Food in the Last Year: Never true  Housing: Low Risk    Last Housing Risk Score: 0  Physical Activity: Sufficiently Active   Days of Exercise per Week: 5 days   Minutes of Exercise per Session: 30 min  Social Connections: Engineer, building services of Communication with Friends and Family: More than three times a week   Frequency of Social Gatherings with Friends and Family: More than three times a week   Attends Religious Services: 1  to 4 times per year   Active Member of Clubs or Organizations: No   Attends  Club or Organization Meetings: 1 to 4 times per year   Marital Status: Married  Stress: No Stress Concern Present   Feeling of Stress : Not at all  Tobacco Use: Medium Risk   Smoking Tobacco Use: Former   Smokeless Tobacco Use: Never  Transportation Needs: No Data processing manager (Medical): No   Lack of Transportation (Non-Medical): No    CCM Care Plan  Allergies  Allergen Reactions   Short Ragweed Pollen Ext Cough    Medications Reviewed Today     Reviewed by [name], [facility] (Pharmacist) on 07/15/21 at 1224  Med List Status: <None>   Medication Order Taking? Sig Documenting Provider Last Dose Status Informant  acetaminophen (TYLENOL) 500 MG tablet [id number] Yes Take 1,000 mg by mouth daily as needed for moderate pain. [provider] Taking Active Self           Med Note Kenton Kingfisher, Enrique Sack T   Tue Aug 07, 2019  6:00 PM)    albuterol (VENTOLIN HFA) 108 (90 Base) MCG/ACT inhaler [id number] Yes Inhale 2 puffs into the lungs every 6 (six) hours as needed for wheezing or shortness of breath. [provider] Taking Active   alfuzosin (UROXATRAL) 10 MG 24 hr tablet [id number] Yes TAKE 1 TABLET AT BEDTIME McKenzie, Joseph Furbish, MD Taking Active   amoxicillin (AMOXIL) 500 MG capsule [id number] No Take 2,000 mg by mouth See admin instructions. Take 4 capsules (2000 mg) by mouth 1 hour prior to dental work  Patient not taking: Reported on 07/15/2021   [provider] Not Taking Active Self  aspirin EC 81 MG tablet [id number] Yes Take 81 mg by mouth daily. [provider] Taking Active Self  buPROPion (WELLBUTRIN XL) 300 MG 24 hr tablet [id number] Yes TAKE 1 TABLET EVERY DAY WITH BREAKFAST Lane, Joseph Lacks, MD Taking Active   Calcium Citrate-Vitamin D (CALCIUM CITRATE + D PO) [id number] Yes Take 1 tablet by mouth 2 (two) times daily. 1297m of Calcium and  1000units vitamin D3 [provider] Taking Active Self           Med Note (Nyoka Cowden FELICIA D   Sat Feb 12, 2017  1:35 AM)    Cholecalciferol (VITAMIN D-3 PO) [id number] Take 2,000 Units by mouth 2 (two) times daily.  [provider] Taking Active Self  clonazePAM (KLONOPIN) 1 MG tablet [id number] TAKE 1 TABLET EVERY 6 HOURS AS NEEDED FOR ANXIETY Lane, AEvie Lacks MD Taking Active   denosumab (PROLIA) 60 MG/ML SOSY injection [id number] Inject 60 mg as directed every 6 (six) months. Last injection: Feb 2020 Next injection: August 2020 [provider] Taking Active Self           Med Note (Vinie SillOct 27, 2020  5:57 PM) Next dose due:11/2019  diltiazem (CARDIZEM CD) 240 MG 24 hr capsule [id number] TAKE 1 CAPSULE EVERY DAY Lane, AEvie Lacks MD Taking Active   DULoxetine (CYMBALTA) 60 MG capsule [id number] TAKE 1 CAPSULE TWICE DAILY Lane, AEvie Lacks MD Taking Active   ezetimibe (ZETIA) 10 MG tablet [id number] TAKE 1 TABLET EVERY DAY. RSkeet Latch MD Taking Active   ferrous sulfate 325 (65 FE) MG tablet [id number] Take 325 mg by mouth 2 (two) times daily with a meal.  [provider] Taking Active Self  hydrochlorothiazide (HYDRODIURIL) 12.5 MG tablet [id number] TAKE 1 TABLET (12.5 MG TOTAL) BY MOUTH DAILY. WLoel Dubonnet NP  Taking Active   HYDROcodone-acetaminophen (NORCO) 7.5-325 MG tablet [id number] Yes Take 1 tablet by mouth every 6 (six) hours as needed for severe pain. Lane, Joseph Lacks, MD Taking Active   isosorbide mononitrate (IMDUR) 30 MG 24 hr tablet [id number] Yes Take 1 tablet (30 mg total) by mouth daily. Joseph Latch, MD Taking Active   levothyroxine (SYNTHROID) 200 MCG tablet [id number] Yes TAKE 1 TABLET EVERY DAY AT 6AM Lane, Joseph Lacks, MD Taking Active   loratadine (CLARITIN) 10 MG tablet [id number] Yes Take 10 mg by mouth daily.  [provider] Taking Active Self  losartan  (COZAAR) 50 MG tablet [id number] Yes TAKE 1 TABLET EVERY DAY Joseph Latch, MD Taking Active   Magnesium 500 MG CAPS [id number] Yes Take 1 capsule (500 mg total) by mouth daily.  Patient taking differently: Take 500 mg by mouth every evening.   Joseph Genera, MD Taking Active   metoprolol tartrate (LOPRESSOR) 25 MG tablet [id number] Yes Take 1 tablet (25 mg total) by mouth 2 (two) times daily. Joseph Latch, MD Taking Active   mirabegron ER [facility]) 25 MG TB24 tablet [id number] Yes Take 25 mg by mouth at bedtime.  [provider] Taking Active Self  Multiple Vitamin (MULTIVITAMIN WITH MINERALS) TABS tablet [id number] Yes Take 1 tablet by mouth daily. Men's One-A-Day 50+ [provider] Taking Active Self           Med Note Nyoka Cowden, FELICIA D   Sat Feb 12, 2017  1:34 AM)    nitroGLYCERIN (NITROSTAT) 0.4 MG SL tablet [id number] No Place 0.4 mg under the tongue every 5 (five) minutes as needed for chest pain. x3 doses as needed for chest pain  Patient not taking: Reported on 07/15/2021   [provider] Not Taking Active Self           Med Note Delice Bison, Darnelle Maffucci   Wed Jul 15, 2021 11:15 AM)    omeprazole (PRILOSEC) 40 MG capsule [id number] Yes TAKE 1 CAPSULE EVERY DAY BEFORE BREAKFAST Lane, Joseph Lacks, MD Taking Active   Potassium Citrate 15 MEQ (1620 MG) TBCR [id number] Yes TAKE 1 TABLET TWICE DAILY McKenzie, Joseph Furbish, MD Taking Active   Probiotic Product (PROBIOTIC PO) [id number] Yes Take 1 capsule by mouth daily with breakfast. [provider] Taking Active Self           Med Note Erick Colace D   Sat Feb 12, 2017  1:34 AM)    [employer] [facility] PEN [place]) [id number] Yes Inject 150 mg into the skin every 3 (three) months. [provider] Taking Active   rivaroxaban (XARELTO) 10 MG TABS tablet [id number] Yes Take 1 tablet (10 mg total) by mouth daily. Lane, Joseph Lacks, MD Taking Active   testosterone enanthate (DELATESTRYL) 200 MG/ML  injection [id number] Yes INJECT 1ML (200MG) INTO THE MUSCLE EVERY 14 DAYS. DISCARD VIAL AFTER 28 DAYS. Lane, Joseph Lacks, MD Taking Active   tiZANidine (ZANAFLEX) 4 MG tablet [id number] Yes TAKE 1 TABLET EVERY 6 HOURS AS NEEDED FOR MUSCLE SPASM(S) Lane, Joseph Lacks, MD Taking Active   vitamin C (ASCORBIC ACID) 500 MG tablet [id number] Yes Take 500 mg by mouth daily. [provider] Taking Active Self           Med Note Dorothy Puffer   XTA Feb 12, 2017  1:34 AM)              Patient Active Problem List   Diagnosis Date Noted   Pure hypercholesterolemia 02/10/2021  Chronic diastolic heart failure ([place]) 06/21/2019   H/O nephrolithotomy with removal of calculi 03/26/2019   Renal calculus 03/26/2019   Generalized weakness 11/08/2018   Well adult exam 10/02/2018   Counseling regarding advance care planning and goals of care 07/09/2018   Diffuse large B-cell lymphoma ([place]) 06/23/2018   Port-A-Cath in place 06/23/2018   Hypogonadism in male 11/22/2017   Nephrolithiasis 10/24/2017   Recurrent dislocation of left patella 07/15/2017   Recurrent subluxation of patella, left 07/08/2017   S/P revision of total knee 04/11/2017   Failed total knee, left, initial encounter ([place]) 04/09/2017   Renal calculus, right 02/08/2017   Protein-calorie malnutrition, severe ([place]) 12/08/2016   Chronic obstructive pulmonary disease ([place]) 12/08/2016   Staghorn renal calculus 11/05/2016   Wound drainage right hip 11/15/2013   Expected blood loss anemia 09/18/2013   Hyponatremia 09/18/2013   S/P right TH revision 09/17/2013   Nonunion, fracture, Right tibia  07/03/2013   UTI (urinary tract infection) 07/03/2013   Pathological fracture due to osteoporosis with delayed healing 04/24/2013   Osteoporosis with fracture 04/24/2013   Rash and nonspecific skin eruption 03/08/2013   Syncope 02/21/2013   Chronic anticoagulation 02/21/2013   Fall 02/08/2013   Physical deconditioning 02/08/2013   Low  back pain radiating to both legs 02/08/2013   Compression fracture of L1 lumbar vertebra ([place]) 02/08/2013   Right tibial fracture 02/05/2013   Morbid obesity ([place]) 02/05/2013   Osteomyelitis of right wrist ([place]) 11/02/2012   Anemia 10/06/2012   Essential hypertension 10/03/2012   Psoriasis 10/03/2012   Vitamin D deficiency 10/03/2012   GERD (gastroesophageal reflux disease) 10/03/2012   Hyperglycemia 10/03/2012   Anxiety 10/03/2012   Depression 10/03/2012   CAD (coronary artery disease) 10/03/2012   DJD (degenerative joint disease) 10/03/2012   Chronic gingivitis 10/03/2012   Low testosterone, possible hypogonadism 10/01/2012   Normocytic anemia 09/30/2012   Right femoral fracture ([place]) 09/29/2012   Right forearm fracture 09/29/2012   Longstanding persistent atrial fibrillation ([place]) 09/29/2012   Hypothyroidism 09/29/2012   History of recurrent UTIs 09/29/2012    Immunization History  Administered Date(s) Administered   Fluad Quad(high Dose 65+) 06/26/2020, 07/08/2021   Influenza, High Dose Seasonal PF 07/12/2015, 07/29/2015, 04/28/2017, 08/16/2017, 09/06/2018   Influenza,inj,Quad PF,6+ Mos 07/05/2013, 07/17/2018   PFIZER(Purple Top)SARS-COV-2 Vaccination 12/04/2019, 12/25/2019, 05/12/2020, 06/05/2020   PPD Test 03/19/2016, 11/05/2016, 11/18/2016   Pneumococcal Conjugate-13 12/03/2015   Pneumococcal Polysaccharide-23 10/11/2009, 08/07/2015, 04/28/2017, 08/16/2017   Td 10/11/2012    Conditions to be addressed/monitored:  Hypertension, Hyperlipidemia, Atrial Fibrillation, Heart Failure, Coronary Artery Disease, GERD, Asthma, Hypothyroidism, Depression, Anxiety, Osteoporosis, Overactive Bladder, and BPH  Care Plan : CCM Care Plan  Updates made by [name], RPH since 07/15/2021 12:00 AM     Problem: Hypertension, Hyperlipidemia, Atrial Fibrillation, Heart Failure, Coronary Artery Disease, GERD, Asthma, Hypothyroidism, Depression, Anxiety, Osteoporosis, Overactive  Bladder, and BPH   Priority: High  Onset Date: 07/15/2021     Long-Range Goal: Disease Management   Start Date: 07/15/2021  Expected End Date: 01/13/2022  This Visit's Progress: On track  Priority: High  Note:   Current Barriers:  Unable to independently monitor therapeutic efficacy  Pharmacist Clinical Goal(s):  Patient will achieve adherence to monitoring guidelines and medication adherence to achieve therapeutic efficacy through collaboration with PharmD and provider.   Interventions: 1:1 collaboration with Lane, Joseph Lacks, MD regarding development and update of comprehensive plan of care as evidenced by provider attestation and co-signature Inter-disciplinary care team collaboration (see longitudinal plan of  care) Comprehensive medication review performed; medication list updated in electronic medical record  Hyperlipidemia/ Coronary Artery Disease: (LDL goal < 70) -Controlled Lab Results  Component Value Date   LDLCALC 36 02/24/2021  -Current treatment: Ezetimibe 82m - 1 tablet daily  Aspirin 851m- 1 tablet daily  Nitroglycerin 0.60m13mL tablets - 1 tablet every 5 minutes as needed x 3 doses  -Medications previously tried: atorvastatin   -Current dietary patterns: noted that patient can at times have a diet that is elevated in high cholesterol foods -Current exercise habits: recently has started PREP program - increasing activity levels  -Educated on Cholesterol goals;  Importance of limiting foods high in cholesterol; Exercise goal of 150 minutes per week; -Counseled on diet and exercise extensively Recommended to continue current medication  Atrial Fibrillation (Goal: prevent stroke and major bleeding) -Controlled - CHADS2VASc score: Age (2 points), Heart failure history (1 point), Hypertension history (1 point), and Vascular Disease history (prior MI, peripheral artery disease, or aortic plaque) (1 point) -Current treatment: Rate control: Metoprolol Tartrate 25m5m 1 tablet twice daily / Diltiazem 240mg57m capsule daily Anticoagulation: Xarelto 10mg 70mtablet daily - has been on for at least the last 2 years, could not recall why his dose was changed from 20 and 15mg d43m - denies previous bleed history  -Medications previously tried: xarelto 20mg , 33mlto 15mg, am21mrone,  -Home BP and HR readings: n/a - patient does not have BP cuff / hr monitor at home  -Counseled on increased risk of stroke due to Afib and benefits of anticoagulation for stroke prevention; importance of adherence to anticoagulant exactly as prescribed; bleeding risk associated with Xarelto and importance of self-monitoring for signs/symptoms of bleeding; avoidance of NSAIDs due to increased bleeding risk with anticoagulants; importance of regular laboratory monitoring; seeking medical attention after a head injury or if there is blood in the urine/stool; -Counseled on diet and exercise extensively Recommended to continue current medication  Heart Failure (Goal: manage symptoms and prevent exacerbations)/ Hypertension (BP goal <130/80) -Controlled -Last ejection fraction: 50-55% (Date: 06/28/2019) -HF type: Diastolic -Current treatment: Losartan 50mg - 1 55met daily  HCTZ 12.5mg - 1 ta93mt daily  Diltiazem 240mg 24hr- 56mpsule daily  Isosorbide mononitrate 30mg 24hr- 151mlet daily  Metoprolol Tartrate 25mg - 1 tabl1mwice daily  -Medications previously tried: ranolazine,   -Current home BP/HR readings: n/a - patient does not have BP cuff / hr monitor at home  BP Readings from Last 3 Encounters:  07/08/21 132/70  06/18/21 124/62  06/08/21 139/68  -Current dietary habits: reports that patient does add salt to his food, rarely drinks caffeinated beverages -Current exercise habits : recently has started PREP program - increasing activity levels  -Educated on Benefits of medications for managing symptoms and prolonging life Proper diuretic administration and potassium  supplementation Importance of blood pressure control -Counseled on diet and exercise extensively Recommended to continue current medication  Depression/Anxiety (Goal: Promotion of positive mood) -Controlled -Current treatment: Duloxetine 60mg - 1 capsu89mwice daily  Bupropion XL 300mg - 1 tablet77mly  Clonazepam 1mg - 1 tablet e56my 6 hours as needed  -Medications previously tried/failed: clorazepate, lorazepam, -PHQ9: n/a - unable to be completed - spoke with patient's spouse Michael who managLegrand Comocations  -GAD7: n/a - unable to be completed - spoke with patient's spouse Michael who managLegrand Comocations  -Patient follows with [facility] with Joan Fraifeld  -EDemetrios Isaacsnefits of medication for symptom control  Benefits of cognitive-behavioral therapy with or without medication -Recommended to continue current medication  Osteoporosis (Goal: Prevention of fractures) -Controlled -Last DEXA Scan: 08/19/2016   T-Score left femoral neck: -1.5  T-Score lumbar spine: -1.8 -Current treatment  Calcium 1275m /Vitamin D3 1000units - 1 tablet twice  Vitamin D3 2000units -  1 tablet twice daily  Prolia 6102mmL injection - 1 injection every 6 months  -Medications previously tried: n/a  -Recommend weight-bearing and muscle strengthening exercises for building and maintaining bone density. -Counseled on diet and exercise extensively Recommended to continue current medication  Hypothyroidism (Goal: Maintenance of euthyroid levels) -Unknown - due for updated TSH level -Current treatment  Levothyroxine 2006m- 1 tablet daily  -Medications previously tried: n/a  -Recommended to continue current medication  GERD (Goal: Prevention of acid relux/ heartburn) -Controlled -Current treatment  Omeprazole 62m28m1 capsule daily  -Medications previously tried: n/a  -Counseled on diet and exercise extensively Recommended to continue current medication  COPD (Goal:  control symptoms and prevent exacerbations) -Controlled -Current treatment  Albuterol 108mc39mt - 2 puffs every 6 hours as needed - typically uses 3 times per week (due to increase in activity over the past few months) -Medications previously tried: n/a  -Gold Grade: unsure -unable to find spirometry tests on file  -Exacerbations requiring treatment in last 6 months: 1 -Frequency of rescue inhaler use: 3 times per week -Counseled on Proper inhaler technique; When to use rescue inhaler -Recommended to continue current medication  Overactive Bladder / BPH (Goal: Prevention of urinary issues) -Controlled -Current treatment  Myrbetriq 25mg 67mtablet daily  Alfuzosin 10mg -11mablet daily  -Medications previously tried: tamsulosin   -Recommended to continue current medication  Chronic Pain (hip and back)(Goal: Pain control ) -Controlled -Current treatment  Hydrocodone/APAP 7.5-325mg - 1 tablet every 6 hours as needed (typically takes 3 tablets daily)  Tizanidine 4mg - 150mblet every 6 hours as needed (typically takes 3 tablets daily)  Acetaminophen 500mg - 239mlet daily as needed  -Medications previously tried: ketorolac, percocet,    -Recommended to continue current medication -counseled patient/spouse on risk of respiratory depression due to use of opioid, benzodiazepine, and muscle relaxer, advised only to use medications as needed and not to take together, voiced understanding of safety/risks   Health Maintenance -Vaccine gaps: shingles, COVID booster  -Current therapy:  Magnesium 500mg - 1 59met dialy  Ferrous Sulfate 325mg - 1 t29mt twice daily  Vitamin C 500mg - 1 ta39m daily  Probiotic - 1 capsule daily  Multivitamin - 1 tablet daily  Loratadine 10mg - 1 tab44mdaily  Testosterone 200mg injectio48m1 injection every 14 days  Skyrizi 150mg - 1 injec33m every 12 weeks- follows with [facility] who manages and prescribes  -Educated on Cost vs benefit of each product  must be carefully weighed by individual consumer -Patient is satisfied with current therapy and denies issues -Recommended to continue current medication  Patient Goals/Self-Care Activities Patient will:  - take medications as prescribed focus on medication adherence by not missing any doses of prescribed medications target a minimum of 150 minutes of moderate intensity exercise weekly engage in dietary modifications by reducing sodium intake / reducing high cholesterol food intake   Follow Up Plan: Telephone follow up appointment with care management team member scheduled for: 3 months  The patient has been provided with contact information for the care management team and has been advised to call with any health related questions or concerns.  Medication Assistance: None required.  Patient affirms current coverage meets needs.  Patient's preferred pharmacy is:  [id number], [place] [address] Phone: [phone] Fax: [place], [place] [address] Phone: [phone] Fax: [phone]  [place], [place] [address] Phone: [phone] Fax: [phone]   Uses pill box? Yes Pt endorses 100% compliance  Care Plan and Follow Up Patient Decision:  Patient agrees to Care Plan and Follow-up.  Plan: Telephone follow up appointment with care management team member scheduled for:  6 months and The patient has been provided with contact information for the care management team and has been advised to call with any health related questions or concerns.   [name], PharmD Clinical Pharmacist, [place] screening examination/treatment/procedure(s) were performed by non-physician  practitioner and as supervising physician I was immediately available for consultation/collaboration.  I agree with above. Lew Dawes, MD

## 2021-07-15 NOTE — Patient Instructions (Signed)
[name],  It was great to talk to you today!  Please call me with any questions or concerns.  Visit Information   PATIENT GOALS:   Goals Addressed             This Visit's Progress    Prevent Falls and Broken Bones-Osteoporosis       Timeframe:  Long-Range Goal Priority:  High Start Date:  07/15/2021                          Expected End Date: 01/13/2022                      Follow Up Date 01/13/2022   - always use handrails on the stairs - always wear shoes or slippers with non-slip sole - get at least 10 minutes of activity every day - keep cell phone with me always - make an emergency alert plan in case I fall - pick up clutter from the floors - remove, or use a non-slip pad, with my throw rugs - use a nightlight in the bathroom - wear low heeled or flat shoes with non-skid soles    Why is this important?   When you fall, there are 3 things that control if a bone breaks or not.  These are the fall itself, how hard and the direction that you fall and how fragile your bones are.  Preventing falls is very important for you because of fragile bones.       Track and Manage My Blood Pressure / Heart Rate-Hypertension/CHF/Afib       Timeframe:  Long-Range Goal Priority:  High Start Date:  07/15/2021                          Expected End Date: 01/13/2022                      Follow Up Date 01/13/2022   - check blood pressure weekly - choose a place to take my blood pressure (home, clinic or office, retail store) - write blood pressure results in a log or diary    Why is this important?   You won't feel high blood pressure, but it can still hurt your blood vessels.  High blood pressure can cause heart or kidney problems. It can also cause a stroke.  Making lifestyle changes like losing a little weight or eating less salt will help.  Checking your blood pressure at home and at different times of the day can help to control blood pressure.  If the doctor prescribes  medicine remember to take it the way the doctor ordered.  Call the office if you cannot afford the medicine or if there are questions about it.          Consent to CCM Services: Mr. Forget was given information about Chronic Care Management services including:  CCM service includes personalized support from designated clinical staff supervised by his physician, including individualized plan of care and coordination with other care providers 24/7 contact phone numbers for assistance for urgent and routine care needs. Service will only be billed when office clinical staff spend 20 minutes or more in a month to coordinate care. Only one practitioner may furnish and bill the service in a calendar month. The patient may stop CCM services at any time (effective at the end of the month) by phone call  to the office staff. The patient will be responsible for cost sharing (co-pay) of up to 20% of the service fee (after annual deductible is met).  Patient agreed to services and verbal consent obtained.   Patient verbalizes understanding of instructions provided today and agrees to view in [employer].   Telephone follow up appointment with care management team member scheduled for: 6 months  The patient has been provided with contact information for the care management team and has been advised to call with any health related questions or concerns.   [name], PharmD Clinical Pharmacist, [place]    CLINICAL CARE PLAN: Patient Care Plan: CCM Care Plan     Problem Identified: Hypertension, Hyperlipidemia, Atrial Fibrillation, Heart Failure, Coronary Artery Disease, GERD, Asthma, Hypothyroidism, Depression, Anxiety, Osteoporosis, Overactive Bladder, and BPH   Priority: High  Onset Date: 07/15/2021     Long-Range Goal: Disease Management   Start Date: 07/15/2021  Expected End Date: 01/13/2022  This Visit's Progress: On track  Priority: High  Note:   Current Barriers:  Unable  to independently monitor therapeutic efficacy  Pharmacist Clinical Goal(s):  Patient will achieve adherence to monitoring guidelines and medication adherence to achieve therapeutic efficacy through collaboration with PharmD and provider.   Interventions: 1:1 collaboration with Plotnikov, Evie Lacks, MD regarding development and update of comprehensive plan of care as evidenced by provider attestation and co-signature Inter-disciplinary care team collaboration (see longitudinal plan of care) Comprehensive medication review performed; medication list updated in electronic medical record  Hyperlipidemia/ Coronary Artery Disease: (LDL goal < 70) -Controlled Lab Results  Component Value Date   LDLCALC 36 02/24/2021  -Current treatment: Ezetimibe 13m - 1 tablet daily  Aspirin 866m- 1 tablet daily  Nitroglycerin 0.91m86mL tablets - 1 tablet every 5 minutes as needed x 3 doses  -Medications previously tried: atorvastatin   -Current dietary patterns: noted that patient can at times have a diet that is elevated in high cholesterol foods -Current exercise habits: recently has started PRECitigroupogram - increasing activity levels  -Educated on Cholesterol goals;  Importance of limiting foods high in cholesterol; Exercise goal of 150 minutes per week; -Counseled on diet and exercise extensively Recommended to continue current medication  Atrial Fibrillation (Goal: prevent stroke and major bleeding) -Controlled - CHADS2VASc score: Age (2 points), Heart failure history (1 point), Hypertension history (1 point), and Vascular Disease history (prior MI, peripheral artery disease, or aortic plaque) (1 point) -Current treatment: Rate control: Metoprolol Tartrate 67m60m1 tablet twice daily / Diltiazem 240mg2m capsule daily Anticoagulation: Xarelto 10mg 8mtablet daily - has been on for at least the last 2 years, could not recall why his dose was changed from 20 and 15mg d79m - denies previous bleed  history  -Medications previously tried: xarelto 20mg , 58mlto 15mg, am791mrone,  -Home BP and HR readings: n/a - patient does not have BP cuff / hr monitor at home  -Counseled on increased risk of stroke due to Afib and benefits of anticoagulation for stroke prevention; importance of adherence to anticoagulant exactly as prescribed; bleeding risk associated with Xarelto and importance of self-monitoring for signs/symptoms of bleeding; avoidance of NSAIDs due to increased bleeding risk with anticoagulants; importance of regular laboratory monitoring; seeking medical attention after a head injury or if there is blood in the urine/stool; -Counseled on diet and exercise extensively Recommended to continue current medication  Heart Failure (Goal: manage symptoms and prevent exacerbations)/ Hypertension (BP goal <130/80) -Controlled -Last  ejection fraction: 50-55% (Date: 06/28/2019) -HF type: Diastolic -Current treatment: Losartan 36m - 1 tablet daily  HCTZ 12.52m- 1 tablet daily  Diltiazem 24065m4hr- 1 capsule daily  Isosorbide mononitrate 63m68mhr- 1 tablet daily  Metoprolol Tartrate 25mg76m tablet twice daily  -Medications previously tried: ranolazine,   -Current home BP/HR readings: n/a - patient does not have BP cuff / hr monitor at home  BP Readings from Last 3 Encounters:  07/08/21 132/70  06/18/21 124/62  06/08/21 139/68  -Current dietary habits: reports that patient does add salt to his food, rarely drinks caffeinated beverages -Current exercise habits : recently has started PREP program - increasing activity levels  -Educated on Benefits of medications for managing symptoms and prolonging life Proper diuretic administration and potassium supplementation Importance of blood pressure control -Counseled on diet and exercise extensively Recommended to continue current medication  Depression/Anxiety (Goal: Promotion of positive mood) -Controlled -Current  treatment: Duloxetine 60mg 22mcapsule twice daily  Bupropion XL 300mg -69mablet daily  Clonazepam 1mg - 186mblet every 6 hours as needed  -Medications previously tried/failed: clorazepate, lorazepam, -PHQ9: n/a - unable to be completed - spoke with patient's spouse Michael Legrand Comoages medications  -GAD7: n/a - unable to be completed - spoke with patient's spouse Michael Legrand Comoages medications  -Patient [facility] with [facility] with Joan FraDemetrios Isaacsted on Benefits of medication for symptom control Benefits of cognitive-behavioral therapy with or without medication -Recommended to continue current medication  Osteoporosis (Goal: Prevention of fractures) -Controlled -Last DEXA Scan: 08/19/2016   T-Score left femoral neck: -1.5  T-Score lumbar spine: -1.8 -Current treatment  Calcium 1200mg /Vi72mn D3 1000units - 1 tablet twice  Vitamin D3 2000units -  1 tablet twice daily  Prolia 60mg/mL i25mtion - 1 injection every 6 months  -Medications previously tried: n/a  -Recommend weight-bearing and muscle strengthening exercises for building and maintaining bone density. -Counseled on diet and exercise extensively Recommended to continue current medication  Hypothyroidism (Goal: Maintenance of euthyroid levels) -Unknown - due for updated TSH level -Current treatment  Levothyroxine 200mcg - 1 40met daily  -Medications previously tried: n/a  -Recommended to continue current medication  GERD (Goal: Prevention of acid relux/ heartburn) -Controlled -Current treatment  Omeprazole 40mg - 1 ca79me daily  -Medications previously tried: n/a  -Counseled on diet and exercise extensively Recommended to continue current medication  COPD (Goal: control symptoms and prevent exacerbations) -Controlled -Current treatment  Albuterol 108mcg/act - 44mffs every 6 hours as needed - typically uses 3 times per week (due to increase in activity over the past few  months) -Medications previously tried: n/a  -Gold Grade: unsure -unable to find spirometry tests on file  -Exacerbations requiring treatment in last 6 months: 1 -Frequency of rescue inhaler use: 3 times per week -Counseled on Proper inhaler technique; When to use rescue inhaler -Recommended to continue current medication  Overactive Bladder / BPH (Goal: Prevention of urinary issues) -Controlled -Current treatment  Myrbetriq 25mg - 1 tabl109maily  Alfuzosin 10mg - 1 table50mily  -Medications previously tried: tamsulosin   -Recommended to continue current medication  Chronic Pain (hip and back)(Goal: Pain control ) -Controlled -Current treatment  Hydrocodone/APAP 7.5-325mg - 1 tablet every 6 hours as needed (typically takes 3 tablets daily)  Tizanidine 4mg - 1 tablet 6mry 6 hours as needed (typically takes 3 tablets daily)  Acetaminophen 500mg - 2 tablet 74my as needed  -Medications previously tried: ketorolac, percocet,    -  Recommended to continue current medication -counseled patient/spouse on risk of respiratory depression due to use of opioid, benzodiazepine, and muscle relaxer, advised only to use medications as needed and not to take together, voiced understanding of safety/risks   Health Maintenance -Vaccine gaps: shingles, COVID booster  -Current therapy:  Magnesium 518m - 1 tablet dialy  Ferrous Sulfate 3259m- 1 tablet twice daily  Vitamin C 50016m 1 tablet daily  Probiotic - 1 capsule daily  Multivitamin - 1 tablet daily  Loratadine 67m75m1 tablet daily  Testosterone 200mg81mection - 1 injection every 14 days  Skyrizi 150mg 61minjection every 12 weeks- [facility]  -Educated on Cost vs benefit of each product must be carefully weighed by individual consumer -Patient is satisfied with current therapy and denies issues -Recommended to continue current medication  Patient Goals/Self-Care Activities Patient will:  -  take medications as prescribed focus on medication adherence by not missing any doses of prescribed medications target a minimum of 150 minutes of moderate intensity exercise weekly engage in dietary modifications by reducing sodium intake / reducing high cholesterol food intake   Follow Up Plan: Telephone follow up appointment with care management team member scheduled for: 3 months  The patient has been provided with contact information for the care management team and has been advised to call with any health related questions or concerns.

## 2021-07-21 DIAGNOSIS — R3915 Urgency of urination: Secondary | ICD-10-CM | POA: Diagnosis not present

## 2021-07-21 DIAGNOSIS — M6289 Other specified disorders of muscle: Secondary | ICD-10-CM | POA: Diagnosis not present

## 2021-07-21 DIAGNOSIS — N3941 Urge incontinence: Secondary | ICD-10-CM | POA: Diagnosis not present

## 2021-07-21 DIAGNOSIS — R350 Frequency of micturition: Secondary | ICD-10-CM | POA: Diagnosis not present

## 2021-07-21 DIAGNOSIS — M6281 Muscle weakness (generalized): Secondary | ICD-10-CM | POA: Diagnosis not present

## 2021-07-21 DIAGNOSIS — M62838 Other muscle spasm: Secondary | ICD-10-CM | POA: Diagnosis not present

## 2021-07-27 DIAGNOSIS — J301 Allergic rhinitis due to pollen: Secondary | ICD-10-CM | POA: Diagnosis not present

## 2021-07-27 DIAGNOSIS — J3089 Other allergic rhinitis: Secondary | ICD-10-CM | POA: Diagnosis not present

## 2021-08-04 DIAGNOSIS — M6281 Muscle weakness (generalized): Secondary | ICD-10-CM | POA: Diagnosis not present

## 2021-08-04 DIAGNOSIS — M62838 Other muscle spasm: Secondary | ICD-10-CM | POA: Diagnosis not present

## 2021-08-04 DIAGNOSIS — N3941 Urge incontinence: Secondary | ICD-10-CM | POA: Diagnosis not present

## 2021-08-06 DIAGNOSIS — F331 Major depressive disorder, recurrent, moderate: Secondary | ICD-10-CM | POA: Diagnosis not present

## 2021-08-06 DIAGNOSIS — F410 Panic disorder [episodic paroxysmal anxiety] without agoraphobia: Secondary | ICD-10-CM | POA: Diagnosis not present

## 2021-08-10 ENCOUNTER — Other Ambulatory Visit: Payer: Self-pay | Admitting: Cardiovascular Disease

## 2021-08-10 DIAGNOSIS — I2510 Atherosclerotic heart disease of native coronary artery without angina pectoris: Secondary | ICD-10-CM | POA: Diagnosis not present

## 2021-08-10 DIAGNOSIS — I480 Paroxysmal atrial fibrillation: Secondary | ICD-10-CM | POA: Diagnosis not present

## 2021-08-10 DIAGNOSIS — I10 Essential (primary) hypertension: Secondary | ICD-10-CM

## 2021-08-10 DIAGNOSIS — E039 Hypothyroidism, unspecified: Secondary | ICD-10-CM | POA: Diagnosis not present

## 2021-08-10 DIAGNOSIS — I5032 Chronic diastolic (congestive) heart failure: Secondary | ICD-10-CM | POA: Diagnosis not present

## 2021-08-12 ENCOUNTER — Other Ambulatory Visit: Payer: Self-pay | Admitting: Cardiovascular Disease

## 2021-08-13 ENCOUNTER — Telehealth: Payer: Self-pay | Admitting: Cardiovascular Disease

## 2021-08-13 ENCOUNTER — Other Ambulatory Visit: Payer: Self-pay | Admitting: Cardiovascular Disease

## 2021-08-13 DIAGNOSIS — E7800 Pure hypercholesterolemia, unspecified: Secondary | ICD-10-CM

## 2021-08-13 DIAGNOSIS — Z5181 Encounter for therapeutic drug level monitoring: Secondary | ICD-10-CM

## 2021-08-13 NOTE — Telephone Encounter (Signed)
*  STAT* If patient is at the pharmacy, call can be transferred to refill team.   1. Which medications need to be refilled? (please list name of each medication and dose if known) ATORVASTATIN CALCIUM 80 MG   2. Which pharmacy/location (including street and city if local pharmacy) is medication to be sent to?[place], [place]  3. Do they need a 30 day or 90 day supply? 90 day

## 2021-08-17 ENCOUNTER — Other Ambulatory Visit: Payer: Self-pay | Admitting: Cardiovascular Disease

## 2021-08-17 NOTE — Telephone Encounter (Signed)
Sent you and Dr. Oval Linsey a pt call on this rx request. I have not been able to find recent documentation that says he should be taking this medication. Please advise on whether we should refill or not. Thank you!

## 2021-08-18 ENCOUNTER — Other Ambulatory Visit: Payer: Self-pay | Admitting: Cardiovascular Disease

## 2021-08-18 DIAGNOSIS — M6281 Muscle weakness (generalized): Secondary | ICD-10-CM | POA: Diagnosis not present

## 2021-08-18 DIAGNOSIS — N3941 Urge incontinence: Secondary | ICD-10-CM | POA: Diagnosis not present

## 2021-08-18 DIAGNOSIS — M62838 Other muscle spasm: Secondary | ICD-10-CM | POA: Diagnosis not present

## 2021-08-18 MED ORDER — ATORVASTATIN CALCIUM 80 MG PO TABS: 80.0000 mg | ORAL_TABLET | Freq: Every day | ORAL | 3 refills | Status: DC

## 2021-08-18 MED ORDER — METOPROLOL TARTRATE 25 MG PO TABS: 25.0000 mg | ORAL_TABLET | Freq: Two times a day (BID) | ORAL | 3 refills | Status: DC

## 2021-08-18 NOTE — Telephone Encounter (Signed)
Skeet Latch, MD to Ernie Hew D  Me   5:32 PM I don't have record of this either, but I'm OK with him taking it if he has been.  OK to refill and repeat lipids/CMP in 3 months.   TCR    Confirmed taking and sent to [facility]

## 2021-08-24 DIAGNOSIS — M25562 Pain in left knee: Secondary | ICD-10-CM | POA: Diagnosis not present

## 2021-08-26 DIAGNOSIS — J301 Allergic rhinitis due to pollen: Secondary | ICD-10-CM | POA: Diagnosis not present

## 2021-08-26 DIAGNOSIS — J3089 Other allergic rhinitis: Secondary | ICD-10-CM | POA: Diagnosis not present

## 2021-08-28 ENCOUNTER — Other Ambulatory Visit: Payer: Self-pay | Admitting: Internal Medicine

## 2021-08-28 ENCOUNTER — Telehealth: Payer: Self-pay | Admitting: Internal Medicine

## 2021-08-28 NOTE — Telephone Encounter (Signed)
MD is out of the office until Dec 1st. Pls advise on refill.Marland KitchenJohny Lane

## 2021-08-28 NOTE — Telephone Encounter (Signed)
Done erx 

## 2021-08-28 NOTE — Telephone Encounter (Signed)
Patient calling in to make sure we received refill request

## 2021-09-01 DIAGNOSIS — B351 Tinea unguium: Secondary | ICD-10-CM | POA: Diagnosis not present

## 2021-09-01 DIAGNOSIS — I739 Peripheral vascular disease, unspecified: Secondary | ICD-10-CM | POA: Diagnosis not present

## 2021-09-01 DIAGNOSIS — L603 Nail dystrophy: Secondary | ICD-10-CM | POA: Diagnosis not present

## 2021-09-16 DIAGNOSIS — M62838 Other muscle spasm: Secondary | ICD-10-CM | POA: Diagnosis not present

## 2021-09-16 DIAGNOSIS — N3941 Urge incontinence: Secondary | ICD-10-CM | POA: Diagnosis not present

## 2021-09-16 DIAGNOSIS — M6281 Muscle weakness (generalized): Secondary | ICD-10-CM | POA: Diagnosis not present

## 2021-09-18 ENCOUNTER — Encounter: Payer: Self-pay | Admitting: Internal Medicine

## 2021-09-18 DIAGNOSIS — F410 Panic disorder [episodic paroxysmal anxiety] without agoraphobia: Secondary | ICD-10-CM | POA: Diagnosis not present

## 2021-09-18 DIAGNOSIS — F331 Major depressive disorder, recurrent, moderate: Secondary | ICD-10-CM | POA: Diagnosis not present

## 2021-09-22 ENCOUNTER — Other Ambulatory Visit: Payer: Self-pay | Admitting: Internal Medicine

## 2021-09-22 MED ORDER — TESTOSTERONE ENANTHATE 200 MG/ML IM SOLN: INTRAMUSCULAR | 3 refills | Status: DC

## 2021-09-24 DIAGNOSIS — J301 Allergic rhinitis due to pollen: Secondary | ICD-10-CM | POA: Diagnosis not present

## 2021-09-24 DIAGNOSIS — J3089 Other allergic rhinitis: Secondary | ICD-10-CM | POA: Diagnosis not present

## 2021-09-28 ENCOUNTER — Ambulatory Visit (HOSPITAL_BASED_OUTPATIENT_CLINIC_OR_DEPARTMENT_OTHER): Payer: Medicare PPO | Admitting: Cardiovascular Disease

## 2021-10-05 ENCOUNTER — Other Ambulatory Visit: Payer: Self-pay | Admitting: Urology

## 2021-10-07 ENCOUNTER — Other Ambulatory Visit: Payer: Self-pay

## 2021-10-07 ENCOUNTER — Ambulatory Visit: Payer: Medicare PPO | Admitting: Internal Medicine

## 2021-10-07 ENCOUNTER — Encounter: Payer: Self-pay | Admitting: Internal Medicine

## 2021-10-07 DIAGNOSIS — I2510 Atherosclerotic heart disease of native coronary artery without angina pectoris: Secondary | ICD-10-CM | POA: Diagnosis not present

## 2021-10-07 DIAGNOSIS — C8330 Diffuse large B-cell lymphoma, unspecified site: Secondary | ICD-10-CM

## 2021-10-07 DIAGNOSIS — I5032 Chronic diastolic (congestive) heart failure: Secondary | ICD-10-CM | POA: Diagnosis not present

## 2021-10-07 DIAGNOSIS — M79605 Pain in left leg: Secondary | ICD-10-CM | POA: Diagnosis not present

## 2021-10-07 DIAGNOSIS — I10 Essential (primary) hypertension: Secondary | ICD-10-CM | POA: Diagnosis not present

## 2021-10-07 DIAGNOSIS — M545 Low back pain, unspecified: Secondary | ICD-10-CM | POA: Diagnosis not present

## 2021-10-07 DIAGNOSIS — M79604 Pain in right leg: Secondary | ICD-10-CM | POA: Diagnosis not present

## 2021-10-07 NOTE — Assessment & Plan Note (Signed)
Cont w/Xarelto, Zetia, Imdur

## 2021-10-07 NOTE — Assessment & Plan Note (Signed)
Cont on Losartan °

## 2021-10-07 NOTE — Progress Notes (Signed)
Subjective:  Patient ID: [name], male    DOB: 12-18-1945  Age: 75 y.o. MRN: [id number]  CC: Follow-up (3 month follow up)   HPI [name] presents for LBP, BPH, HTN, hypothyroidism f/u  Outpatient Medications Prior to Visit  Medication Sig Dispense Refill   acetaminophen (TYLENOL) 500 MG tablet Take 1,000 mg by mouth daily as needed for moderate pain.     albuterol (VENTOLIN HFA) 108 (90 Base) MCG/ACT inhaler Inhale 2 puffs into the lungs every 6 (six) hours as needed for wheezing or shortness of breath.     alfuzosin (UROXATRAL) 10 MG 24 hr tablet TAKE 1 TABLET AT BEDTIME 90 tablet 3   aspirin EC 81 MG tablet Take 81 mg by mouth daily.     atorvastatin (LIPITOR) 80 MG tablet Take 1 tablet (80 mg total) by mouth daily. 90 tablet 3   buPROPion (WELLBUTRIN XL) 300 MG 24 hr tablet TAKE 1 TABLET EVERY DAY WITH BREAKFAST 90 tablet 3   Calcium Citrate-Vitamin D (CALCIUM CITRATE + D PO) Take 1 tablet by mouth 2 (two) times daily. 1200mg  of Calcium and 1000units vitamin D3     Cholecalciferol (VITAMIN D-3 PO) Take 2,000 Units by mouth 2 (two) times daily.      clonazePAM (KLONOPIN) 1 MG tablet Take 1 tablet (1 mg total) by mouth every 6 (six) hours as needed. for anxiety 120 tablet 0   denosumab (PROLIA) 60 MG/ML SOSY injection Inject 60 mg as directed every 6 (six) months. Last injection: Feb 2020 Next injection: August 2020     diltiazem (CARDIZEM CD) 240 MG 24 hr capsule TAKE 1 CAPSULE EVERY DAY 90 capsule 3   DULoxetine (CYMBALTA) 60 MG capsule TAKE 1 CAPSULE TWICE DAILY 180 capsule 3   EPINEPHrine 0.3 mg/0.3 mL IJ SOAJ injection See admin instructions.     ezetimibe (ZETIA) 10 MG tablet TAKE 1 TABLET EVERY DAY 90 tablet 2   ferrous sulfate 325 (65 FE) MG tablet Take 325 mg by mouth 2 (two) times daily with a meal.      hydrochlorothiazide (HYDRODIURIL) 12.5 MG tablet TAKE 1 TABLET (12.5 MG TOTAL) BY MOUTH DAILY. 90 tablet 3   HYDROcodone-acetaminophen (NORCO) 7.5-325 MG  tablet Take 1 tablet by mouth every 6 (six) hours as needed for severe pain. 120 tablet 0   isosorbide mononitrate (IMDUR) 30 MG 24 hr tablet TAKE 1 TABLET (30 MG TOTAL) BY MOUTH DAILY. 90 tablet 2   levothyroxine (SYNTHROID) 200 MCG tablet TAKE 1 TABLET EVERY DAY AT 6AM 90 tablet 3   loratadine (CLARITIN) 10 MG tablet Take 10 mg by mouth daily.      loratadine (CLARITIN) 10 MG tablet 1 tablet     losartan (COZAAR) 50 MG tablet TAKE 1 TABLET EVERY DAY 90 tablet 2   Magnesium 500 MG CAPS Take 1 capsule (500 mg total) by mouth daily. (Patient taking differently: Take 500 mg by mouth every evening.) 30 capsule 1   metoprolol tartrate (LOPRESSOR) 25 MG tablet Take 1 tablet (25 mg total) by mouth 2 (two) times daily. 180 tablet 3   Multiple Vitamin (MULTIVITAMIN WITH MINERALS) TABS tablet Take 1 tablet by mouth daily. Men's One-A-Day 50+     MYRBETRIQ 25 MG TB24 tablet TAKE 1 TABLET EVERY DAY 90 tablet 3   nitroGLYCERIN (NITROSTAT) 0.4 MG SL tablet Place 0.4 mg under the tongue every 5 (five) minutes as needed for chest pain. x3 doses as needed for chest pain  omeprazole (PRILOSEC) 40 MG capsule TAKE 1 CAPSULE EVERY DAY BEFORE BREAKFAST 90 capsule 3   Potassium Citrate 15 MEQ (1620 MG) TBCR TAKE 1 TABLET TWICE DAILY 180 tablet 3   Probiotic Product (PROBIOTIC PO) Take 1 capsule by mouth daily with breakfast.     Risankizumab-rzaa (SKYRIZI PEN [place]) Inject 150 mg into the skin every 3 (three) months.     rivaroxaban (XARELTO) 10 MG TABS tablet Take 1 tablet (10 mg total) by mouth daily. 90 tablet 3   testosterone enanthate (DELATESTRYL) 200 MG/ML injection INJECT 1ML (200MG ) INTO THE MUSCLE EVERY 14 DAYS. DISCARD VIAL AFTER 28 DAYS. 5 mL 3   tiZANidine (ZANAFLEX) 4 MG tablet TAKE 1 TABLET EVERY 6 HOURS AS NEEDED FOR MUSCLE SPASM(S) 360 tablet 1   vitamin C (ASCORBIC ACID) 500 MG tablet Take 500 mg by mouth daily.     amoxicillin (AMOXIL) 500 MG capsule Take 2,000 mg by mouth See admin instructions.  Take 4 capsules (2000 mg) by mouth 1 hour prior to dental work (Patient not taking: Reported on 07/15/2021)     SKYRIZI PEN 150 MG/ML SOAJ      solifenacin (VESICARE) 5 MG tablet      tacrolimus (PROTOPIC) 0.1 % ointment See admin instructions.     No facility-administered medications prior to visit.    ROS: Review of Systems  Constitutional:  Negative for appetite change, fatigue and unexpected weight change.  HENT:  Negative for congestion, nosebleeds, sneezing, sore throat and trouble swallowing.   Eyes:  Negative for itching and visual disturbance.  Respiratory:  Negative for cough.   Cardiovascular:  Negative for chest pain, palpitations and leg swelling.  Gastrointestinal:  Negative for abdominal distention, blood in stool, diarrhea and nausea.  Genitourinary:  Negative for frequency and hematuria.  Musculoskeletal:  Positive for arthralgias, back pain and gait problem. Negative for joint swelling and neck pain.  Skin:  Negative for rash.  Neurological:  Negative for dizziness, tremors, speech difficulty and weakness.  Psychiatric/Behavioral:  Negative for agitation, dysphoric mood and sleep disturbance. The patient is nervous/anxious.    Objective:  BP 130/78 (BP Location: Left Arm, Patient Position: Sitting, Cuff Size: Normal)    Pulse 89    Temp 98.2 F (36.8 C) (Oral)    Ht 6\' 3"  (1.905 m)    Wt 287 lb (130.2 kg)    SpO2 99%    BMI 35.87 kg/m   BP Readings from Last 3 Encounters:  10/07/21 130/78  07/08/21 132/70  06/18/21 124/62    Wt Readings from Last 3 Encounters:  10/07/21 287 lb (130.2 kg)  07/08/21 290 lb 3.2 oz (131.6 kg)  06/18/21 295 lb (133.8 kg)    Physical Exam Constitutional:      General: He is not in acute distress.    Appearance: He is well-developed.     Comments: NAD  Eyes:     Conjunctiva/sclera: Conjunctivae normal.     Pupils: Pupils are equal, round, and reactive to light.  Neck:     Thyroid: No thyromegaly.     Vascular: No JVD.   Cardiovascular:     Rate and Rhythm: Normal rate and regular rhythm.     Heart sounds: Normal heart sounds. No murmur heard.   No friction rub. No gallop.  Pulmonary:     Effort: Pulmonary effort is normal. No respiratory distress.     Breath sounds: Normal breath sounds. No wheezing or rales.  Chest:     Chest wall: No  tenderness.  Abdominal:     General: Bowel sounds are normal. There is no distension.     Palpations: Abdomen is soft. There is no mass.     Tenderness: There is no abdominal tenderness. There is no guarding or rebound.  Musculoskeletal:        General: No tenderness. Normal range of motion.     Cervical back: Normal range of motion.  Lymphadenopathy:     Cervical: No cervical adenopathy.  Skin:    General: Skin is warm and dry.     Findings: No rash.  Neurological:     Mental Status: He is alert and oriented to person, place, and time.     Cranial Nerves: No cranial nerve deficit.     Motor: No abnormal muscle tone.     Coordination: Coordination normal.     Gait: Gait normal.     Deep Tendon Reflexes: Reflexes are normal and symmetric.  Psychiatric:        Behavior: Behavior normal.        Thought Content: Thought content normal.        Judgment: Judgment normal.    Lab Results  Component Value Date   WBC 6.5 06/08/2021   HGB 17.4 (H) 06/08/2021   HCT 52.3 (H) 06/08/2021   PLT 130 (L) 06/08/2021   GLUCOSE 141 (H) 06/08/2021   CHOL 101 02/24/2021   TRIG 91 02/24/2021   HDL 47 02/24/2021   LDLCALC 36 02/24/2021   ALT 42 06/08/2021   AST 27 06/08/2021   NA 140 06/08/2021   K 4.7 06/08/2021   CL 104 06/08/2021   CREATININE 1.38 (H) 06/08/2021   BUN 26 (H) 06/08/2021   CO2 29 06/08/2021   TSH 0.02 Repeated and verified X2. (L) 07/08/2020   PSA 1.08 06/26/2020   INR 1.0 03/26/2019   HGBA1C 5.8 (H) 02/06/2013    DG Chest Port 1 View  Result Date: 05/19/2021 CLINICAL DATA:  Dyspnea, history of CHF and shortness of breath EXAM: PORTABLE CHEST 1  VIEW COMPARISON:  Chest radiograph 06/15/2019 FINDINGS: A right-sided central venous catheter is in place terminating in the SVC. The heart is enlarged. The mediastinal contours are within normal limits. There is vascular congestion with interstitial prominence suggesting pulmonary interstitial edema. There may be trace bilateral pleural effusions. There is no focal consolidation. There is no pneumothorax. There is a 1.0 cm nodular opacity projecting over the left upper lobe not seen on the prior study. There is no acute osseous abnormality. IMPRESSION: 1. Cardiomegaly with mild pulmonary interstitial edema and possible trace bilateral pleural effusions. 2. 1.0 cm nodular opacity projecting over the left upper lobe not seen on the prior study. This may be artifactual; however, recommend nonemergent CT chest to exclude a true parenchymal nodule Electronically Signed   By: Valetta Mole MD   On: 05/19/2021 09:55    Assessment & Plan:   Problem List Items Addressed This Visit     Low back pain radiating to both legs (Chronic)    Cont on Norco prn  Potential benefits of a long term opioids use as well as potential risks (i.e. addiction risk, apnea etc) and complications (i.e. Somnolence, constipation and others) were explained to the patient and were aknowledged.       CAD (coronary artery disease)    Cont w/Xarelto, Zetia, Imdur      Relevant Medications   EPINEPHrine 0.3 mg/0.3 mL IJ SOAJ injection   Other Relevant Orders   PSA  T4, free   Testosterone   TSH   Chronic diastolic heart failure (HCC)    Cont w/CPAP On Lasix F/u w/Card - Dr Oval Linsey  In [place] program - doing well      Relevant Medications   EPINEPHrine 0.3 mg/0.3 mL IJ SOAJ injection   Other Relevant Orders   PSA   T4, free   Testosterone   TSH   Diffuse large B-cell lymphoma ([place])    Now in remission      Relevant Medications   loratadine (CLARITIN) 10 MG tablet   Essential hypertension    Cont on Losartan       Relevant Medications   EPINEPHrine 0.3 mg/0.3 mL IJ SOAJ injection   Other Relevant Orders   PSA   T4, free   Testosterone   TSH   Morbid obesity ([place])    In a PREP program         No orders of the defined types were placed in this encounter.     Follow-up: Return in about 3 months (around 01/05/2022) for a follow-up visit.  Walker Kehr, MD

## 2021-10-07 NOTE — Assessment & Plan Note (Signed)
In a PREP program

## 2021-10-07 NOTE — Assessment & Plan Note (Signed)
Now in remission

## 2021-10-07 NOTE — Assessment & Plan Note (Signed)
Cont on Norco prn ° Potential benefits of a long term opioids use as well as potential risks (i.e. addiction risk, apnea etc) and complications (i.e. Somnolence, constipation and others) were explained to the patient and were aknowledged. °

## 2021-10-07 NOTE — Assessment & Plan Note (Signed)
Cont w/CPAP On Lasix F/u w/Card - Dr Oval Linsey  In [place] program - doing well

## 2021-10-13 ENCOUNTER — Encounter (HOSPITAL_BASED_OUTPATIENT_CLINIC_OR_DEPARTMENT_OTHER): Payer: Self-pay

## 2021-10-13 ENCOUNTER — Emergency Department (HOSPITAL_BASED_OUTPATIENT_CLINIC_OR_DEPARTMENT_OTHER)
Admission: EM | Admit: 2021-10-13 | Discharge: 2021-10-13 | Disposition: A | Discharge status: Home / Self Care | Payer: Medicare PPO | Attending: Emergency Medicine | Admitting: Emergency Medicine

## 2021-10-13 ENCOUNTER — Other Ambulatory Visit: Payer: Self-pay

## 2021-10-13 ENCOUNTER — Emergency Department (HOSPITAL_BASED_OUTPATIENT_CLINIC_OR_DEPARTMENT_OTHER): Payer: Medicare PPO | Admitting: Radiology

## 2021-10-13 DIAGNOSIS — Z79899 Other long term (current) drug therapy: Secondary | ICD-10-CM | POA: Diagnosis not present

## 2021-10-13 DIAGNOSIS — I10 Essential (primary) hypertension: Secondary | ICD-10-CM | POA: Insufficient documentation

## 2021-10-13 DIAGNOSIS — J189 Pneumonia, unspecified organism: Secondary | ICD-10-CM

## 2021-10-13 DIAGNOSIS — Z7901 Long term (current) use of anticoagulants: Secondary | ICD-10-CM | POA: Diagnosis not present

## 2021-10-13 DIAGNOSIS — J1282 Pneumonia due to coronavirus disease 2019: Secondary | ICD-10-CM | POA: Diagnosis not present

## 2021-10-13 DIAGNOSIS — R059 Cough, unspecified: Secondary | ICD-10-CM | POA: Diagnosis not present

## 2021-10-13 DIAGNOSIS — Z7982 Long term (current) use of aspirin: Secondary | ICD-10-CM | POA: Insufficient documentation

## 2021-10-13 DIAGNOSIS — U071 COVID-19: Secondary | ICD-10-CM | POA: Insufficient documentation

## 2021-10-13 DIAGNOSIS — J439 Emphysema, unspecified: Secondary | ICD-10-CM | POA: Diagnosis not present

## 2021-10-13 LAB — RESP PANEL BY RT-PCR (FLU A&B, COVID) ARPGX2
Influenza A by PCR: NEGATIVE
Influenza B by PCR: NEGATIVE
SARS Coronavirus 2 by RT PCR: POSITIVE — AB

## 2021-10-13 MED ORDER — BENZONATATE 100 MG PO CAPS: 100.0000 mg | ORAL_CAPSULE | Freq: Three times a day (TID) | ORAL | 0 refills | Status: DC

## 2021-10-13 MED ORDER — DOXYCYCLINE HYCLATE 100 MG PO CAPS: 100.0000 mg | ORAL_CAPSULE | Freq: Two times a day (BID) | ORAL | 0 refills | Status: AC

## 2021-10-13 NOTE — Discharge Instructions (Addendum)
You tested positive for COVID today.  You also have pneumonia.  I have sent antibiotics to the pharmacy.  There is also a cough medication at the pharmacy as well.  Your medications have been sent to [facility].  I spoke with your husband and he reported that you guys will pick up the medications together in the morning.  Follow-up with your primary care provider within the next week to make sure that your symptoms are improving.

## 2021-10-13 NOTE — ED Triage Notes (Signed)
Patient here POV from Home with Flu-Like Symptoms.   Patient has been having Body Aches, Cough, Fatigue, Sore Throat for approximately 4 days.   Significant Other is COVID-19 Positive.   NAD Noted during Triage. A&Ox4. GCS 15. Ambulatory with Gilford Rile.

## 2021-10-13 NOTE — ED Notes (Signed)
ED Provider at bedside. 

## 2021-10-13 NOTE — ED Provider Notes (Signed)
[facility] Provider Note   CSN: [id number] Arrival date & time: 10/13/21  1328     History  Chief Complaint  Patient presents with   Generalized Body Aches    [name] is a 76 y.o. male presenting with a complaint of URI symptoms for the past week.  Reports that his husband tested positive for COVID today.  He has had a congestion, cough and sore throat since the symptom onset.  Denies difficulty breathing or chest pain.  History of A. fib that has never caused problems.  No GI symptoms.   Home Medications Prior to Admission medications   Medication Sig Start Date End Date Taking? Authorizing Provider  acetaminophen (TYLENOL) 500 MG tablet Take 1,000 mg by mouth daily as needed for moderate pain.    [provider]  albuterol (VENTOLIN HFA) 108 (90 Base) MCG/ACT inhaler Inhale 2 puffs into the lungs every 6 (six) hours as needed for wheezing or shortness of breath.    [provider]  alfuzosin (UROXATRAL) 10 MG 24 hr tablet TAKE 1 TABLET AT BEDTIME 03/19/21   McKenzie, Candee Furbish, MD  amoxicillin (AMOXIL) 500 MG capsule Take 2,000 mg by mouth See admin instructions. Take 4 capsules (2000 mg) by mouth 1 hour prior to dental work Patient not taking: Reported on 07/15/2021    [provider]  aspirin EC 81 MG tablet Take 81 mg by mouth daily.    [provider]  atorvastatin (LIPITOR) 80 MG tablet Take 1 tablet (80 mg total) by mouth daily. 08/18/21 11/16/21  Skeet Latch, MD  buPROPion (WELLBUTRIN XL) 300 MG 24 hr tablet TAKE 1 TABLET EVERY DAY WITH BREAKFAST 02/20/21   Plotnikov, Evie Lacks, MD  Calcium Citrate-Vitamin D (CALCIUM CITRATE + D PO) Take 1 tablet by mouth 2 (two) times daily. 1200mg  of Calcium and 1000units vitamin D3    [provider]  Cholecalciferol (VITAMIN D-3 PO) Take 2,000 Units by mouth 2 (two) times daily.     [provider]  clonazePAM (KLONOPIN) 1 MG tablet Take 1 tablet (1  mg total) by mouth every 6 (six) hours as needed. for anxiety 08/28/21   Biagio Borg, MD  denosumab (PROLIA) 60 MG/ML SOSY injection Inject 60 mg as directed every 6 (six) months. Last injection: Feb 2020 Next injection: August 2020 12/10/15   [provider]  diltiazem (CARDIZEM CD) 240 MG 24 hr capsule TAKE 1 CAPSULE EVERY DAY 03/23/21   Plotnikov, Evie Lacks, MD  DULoxetine (CYMBALTA) 60 MG capsule TAKE 1 CAPSULE TWICE DAILY 03/23/21   Plotnikov, Evie Lacks, MD  EPINEPHrine 0.3 mg/0.3 mL IJ SOAJ injection See admin instructions. 07/31/21   [provider]  ezetimibe (ZETIA) 10 MG tablet TAKE 1 TABLET EVERY DAY 08/10/21   Skeet Latch, MD  ferrous sulfate 325 (65 FE) MG tablet Take 325 mg by mouth 2 (two) times daily with a meal.     [provider]  hydrochlorothiazide (HYDRODIURIL) 12.5 MG tablet TAKE 1 TABLET (12.5 MG TOTAL) BY MOUTH DAILY. 06/23/21   Loel Dubonnet, NP  HYDROcodone-acetaminophen (NORCO) 7.5-325 MG tablet Take 1 tablet by mouth every 6 (six) hours as needed for severe pain. 09/30/20   Plotnikov, Evie Lacks, MD  isosorbide mononitrate (IMDUR) 30 MG 24 hr tablet TAKE 1 TABLET (30 MG TOTAL) BY MOUTH DAILY. 08/10/21   Skeet Latch, MD  levothyroxine (SYNTHROID) 200 MCG tablet TAKE 1 TABLET EVERY DAY AT 6AM 03/23/21   Plotnikov, [employer]  V, MD  loratadine (CLARITIN) 10 MG tablet Take 10 mg by mouth daily.     [provider]  loratadine (CLARITIN) 10 MG tablet 1 tablet    [provider]  losartan (COZAAR) 50 MG tablet TAKE 1 TABLET EVERY DAY 06/18/21   Skeet Latch, MD  Magnesium 500 MG CAPS Take 1 capsule (500 mg total) by mouth daily. Patient taking differently: Take 500 mg by mouth every evening. 10/13/18   Brunetta Genera, MD  metoprolol tartrate (LOPRESSOR) 25 MG tablet Take 1 tablet (25 mg total) by mouth 2 (two) times daily. 08/18/21   Skeet Latch, MD  Multiple Vitamin (MULTIVITAMIN WITH MINERALS) TABS tablet Take  1 tablet by mouth daily. Men's One-A-Day 50+    [provider]  MYRBETRIQ 25 MG TB24 tablet TAKE 1 TABLET EVERY DAY 10/06/21   McKenzie, Candee Furbish, MD  nitroGLYCERIN (NITROSTAT) 0.4 MG SL tablet Place 0.4 mg under the tongue every 5 (five) minutes as needed for chest pain. x3 doses as needed for chest pain    [provider]  omeprazole (PRILOSEC) 40 MG capsule TAKE 1 CAPSULE EVERY DAY BEFORE BREAKFAST 06/18/21   Plotnikov, Evie Lacks, MD  Potassium Citrate 15 MEQ (1620 MG) TBCR TAKE 1 TABLET TWICE DAILY 03/19/21   McKenzie, Candee Furbish, MD  Probiotic Product (PROBIOTIC PO) Take 1 capsule by mouth daily with breakfast.    [provider]  Risankizumab-rzaa (SKYRIZI PEN [place]) Inject 150 mg into the skin every 3 (three) months.    [provider]  rivaroxaban (XARELTO) 10 MG TABS tablet Take 1 tablet (10 mg total) by mouth daily. 06/29/21   Plotnikov, Evie Lacks, MD  SKYRIZI PEN 150 MG/ML SOAJ  08/24/21   [provider]  solifenacin (VESICARE) 5 MG tablet  08/24/21   [provider]  tacrolimus (PROTOPIC) 0.1 % ointment See admin instructions.    [provider]  testosterone enanthate (DELATESTRYL) 200 MG/ML injection INJECT 1ML (200MG ) INTO THE MUSCLE EVERY 14 DAYS. DISCARD VIAL AFTER 28 DAYS. 09/22/21   Plotnikov, Evie Lacks, MD  tiZANidine (ZANAFLEX) 4 MG tablet TAKE 1 TABLET EVERY 6 HOURS AS NEEDED FOR MUSCLE SPASM(S) 06/29/21   Plotnikov, Evie Lacks, MD  vitamin C (ASCORBIC ACID) 500 MG tablet Take 500 mg by mouth daily.    [provider]      Allergies    Short ragweed pollen ext    Review of Systems   Review of Systems  Constitutional:  Positive for fatigue. Negative for chills and fever.  HENT:  Positive for congestion and sore throat.   Respiratory:  Positive for cough. Negative for shortness of breath.   Gastrointestinal:  Negative for vomiting.   Physical Exam Updated Vital Signs BP 128/74 (BP Location: Right Arm)     Pulse 72    Temp (!) 97.3 F (36.3 C)    Resp 20    Ht 6\' 3"  (1.905 m)    Wt 130.2 kg    SpO2 99%    BMI 35.88 kg/m  Physical Exam Vitals and nursing note reviewed.  Constitutional:      General: He is not in acute distress.    Appearance: Normal appearance. He is not ill-appearing.  HENT:     Head: Normocephalic and atraumatic.     Nose: Nose normal.     Mouth/Throat:     Mouth: Mucous membranes are moist.     Pharynx: Oropharynx is clear.  Eyes:     General:  No scleral icterus.    Conjunctiva/sclera: Conjunctivae normal.  Cardiovascular:     Rate and Rhythm: Normal rate. Rhythm irregular.  Pulmonary:     Effort: Pulmonary effort is normal. No respiratory distress.     Breath sounds: Wheezing (RLL) present.  Chest:     Chest wall: No tenderness.  Abdominal:     Tenderness: There is no abdominal tenderness.  Skin:    General: Skin is warm and dry.     Findings: No rash.  Neurological:     Mental Status: He is alert.  Psychiatric:        Mood and Affect: Mood normal.    ED Results / Procedures / Treatments   Labs (all labs ordered are listed, but only abnormal results are displayed) Labs Reviewed  RESP PANEL BY RT-PCR (FLU A&B, COVID) ARPGX2 - Abnormal; Notable for the following components:      Result Value   SARS Coronavirus 2 by RT PCR POSITIVE (*)    All other components within normal limits    EKG None  Radiology DG Chest 2 View  Result Date: 10/13/2021 CLINICAL DATA:  COVID.  Cough. EXAM: CHEST - 2 VIEW COMPARISON:  05/19/2021 FINDINGS: Right chest port in place. The heart is normal in size with normal mediastinal contours, previous cardiomegaly has improved. Aortic atherosclerosis. Emphysema with small blebs at the right lung apex. Minimal ill-defined opacity at the left lung base. No confluent consolidation. No pleural effusion or pneumothorax. No pulmonary edema. No findings of pulmonary edema. Mild anterior wedging of upper thoracic vertebra, chronic.  IMPRESSION: 1. Minimal ill-defined opacity at the left lung base may be atelectasis or pneumonia. 2. Emphysema. Electronically Signed   By: Keith Rake M.D.   On: 10/13/2021 17:32    Procedures Procedures   Medications Ordered in ED Medications - No data to display  ED Course/ Medical Decision Making/ A&P                           Medical Decision Making  Patient presents to the ED for concern of COVID-19 after his husband tested positive.  Co morbidities that complicate the patient evaluation  Hypertension and A. fib   Imaging Studies ordered:  I ordered imaging studies including chest x-ray. I independently visualized and interpreted imaging which showed a likely pneumonia   Problem List / ED Course:  COVID-19 and pneumonia.  Potentially COVID-pneumonia however due to longevity of symptoms, will treat as bacterial  Dispostion:  After consideration of the diagnostic results and the patients response to treatment, I feel that the patent would benefit from [facility] and doxycycline for any potential superimposed bacterial pneumonia.   Final Clinical Impression(s) / ED Diagnoses Final diagnoses:  COVID-19  Pneumonia of left lower lobe due to infectious organism    Rx / DC Orders Results and diagnoses were explained to the patient. Return precautions discussed in full. Patient had no additional questions and expressed complete understanding.   This chart was dictated using voice recognition software.  Despite best efforts to proofread,  errors can occur which can change the documentation meaning.      Rhae Hammock, PA-C 10/13/21 Lyons, [place], DO 10/13/21 [id number]

## 2021-10-13 NOTE — ED Notes (Signed)
EMT-P provided AVS using Teachback Method. Patient verbalizes understanding of Discharge Instructions. Opportunity for Questioning and Answers were provided by EMT-P. Patient Discharged from ED.  ? ?

## 2021-10-26 DIAGNOSIS — D224 Melanocytic nevi of scalp and neck: Secondary | ICD-10-CM | POA: Diagnosis not present

## 2021-10-26 DIAGNOSIS — D225 Melanocytic nevi of trunk: Secondary | ICD-10-CM | POA: Diagnosis not present

## 2021-10-26 DIAGNOSIS — D2261 Melanocytic nevi of right upper limb, including shoulder: Secondary | ICD-10-CM | POA: Diagnosis not present

## 2021-10-26 DIAGNOSIS — L821 Other seborrheic keratosis: Secondary | ICD-10-CM | POA: Diagnosis not present

## 2021-10-26 DIAGNOSIS — D1801 Hemangioma of skin and subcutaneous tissue: Secondary | ICD-10-CM | POA: Diagnosis not present

## 2021-10-26 DIAGNOSIS — M62838 Other muscle spasm: Secondary | ICD-10-CM | POA: Diagnosis not present

## 2021-10-26 DIAGNOSIS — Z85828 Personal history of other malignant neoplasm of skin: Secondary | ICD-10-CM | POA: Diagnosis not present

## 2021-10-26 DIAGNOSIS — J301 Allergic rhinitis due to pollen: Secondary | ICD-10-CM | POA: Diagnosis not present

## 2021-10-26 DIAGNOSIS — N3941 Urge incontinence: Secondary | ICD-10-CM | POA: Diagnosis not present

## 2021-10-26 DIAGNOSIS — L400 Psoriasis vulgaris: Secondary | ICD-10-CM | POA: Diagnosis not present

## 2021-10-26 DIAGNOSIS — L570 Actinic keratosis: Secondary | ICD-10-CM | POA: Diagnosis not present

## 2021-10-26 DIAGNOSIS — L853 Xerosis cutis: Secondary | ICD-10-CM | POA: Diagnosis not present

## 2021-10-26 DIAGNOSIS — M6281 Muscle weakness (generalized): Secondary | ICD-10-CM | POA: Diagnosis not present

## 2021-10-26 DIAGNOSIS — J3089 Other allergic rhinitis: Secondary | ICD-10-CM | POA: Diagnosis not present

## 2021-10-28 ENCOUNTER — Other Ambulatory Visit (HOSPITAL_BASED_OUTPATIENT_CLINIC_OR_DEPARTMENT_OTHER): Payer: Self-pay | Admitting: *Deleted

## 2021-10-28 ENCOUNTER — Other Ambulatory Visit: Payer: Self-pay

## 2021-10-28 ENCOUNTER — Ambulatory Visit (HOSPITAL_BASED_OUTPATIENT_CLINIC_OR_DEPARTMENT_OTHER): Payer: Medicare PPO | Admitting: Cardiovascular Disease

## 2021-10-28 ENCOUNTER — Encounter (HOSPITAL_BASED_OUTPATIENT_CLINIC_OR_DEPARTMENT_OTHER): Payer: Self-pay | Admitting: Cardiovascular Disease

## 2021-10-28 DIAGNOSIS — I5032 Chronic diastolic (congestive) heart failure: Secondary | ICD-10-CM

## 2021-10-28 DIAGNOSIS — I2510 Atherosclerotic heart disease of native coronary artery without angina pectoris: Secondary | ICD-10-CM | POA: Diagnosis not present

## 2021-10-28 DIAGNOSIS — E039 Hypothyroidism, unspecified: Secondary | ICD-10-CM | POA: Diagnosis not present

## 2021-10-28 DIAGNOSIS — I10 Essential (primary) hypertension: Secondary | ICD-10-CM | POA: Diagnosis not present

## 2021-10-28 DIAGNOSIS — I4811 Longstanding persistent atrial fibrillation: Secondary | ICD-10-CM | POA: Diagnosis not present

## 2021-10-28 NOTE — Assessment & Plan Note (Signed)
Medically managed moderate to severe CAD.  He is doing extremely well and exercising regularly.  He has no angina.  Continue aspirin, atorvastatin, and metoprolol.  Check fasting lipids and a CMP today.  Continue Imdur.

## 2021-10-28 NOTE — Progress Notes (Signed)
Cardiology Office Note:    Date:  10/28/2021   ID:  [name], DOB [birth year], MRN [id number]  PCP:  Joseph Anger, MD  Cardiologist:  Joseph Latch, MD  Referring MD: Joseph Anger, MD   No chief complaint on file.  History of Present Illness: [name] is a 76 y.o. male with medically managed moderate to severe CAD, long-standing-persistent atrial fibrillation, chronic diastolic heart failure, hypertension, hyperlipidemia, sleep apnea on CPAP, diffuse large B cell lymphoma (remission), hypothyroidism and prior tobacco abuse here for follow-up.  He was initially seen 05/2017 to establish care.  [name] was previously a patient of [name] and last saw him 01/2016.  At that time he was preparing for left hip surgery.  [name] recommended stress testing but he declined.  He underwent surgery with no complications.  He was initially diagnosed with atrial fibrillation around 2012.  He was started on diltiazem and amiodarone.  He was noted to be in persistent atrial fibrillation.  Therefore amiodarone was discontinued. At the time of his atrial fibrillation diagnosis he also underwent a heart catheterization. He reportedly had 90% obstruction of an unknown vessel that had developed collaterals. He was started on Ranexa.  He had an echo 11/03/16 revealed LVEF of 60-65%. Severe left and right atrial enlargement was noted and the IVC was dilated.  He had no angina and so Ranexa was discontinued.  Consideration was made for starting a beta-blocker instead of a calcium channel blocker.  However we decided not to make too many changes at one time.  [name] was diagnosed with diffuse large B cell lymphoma on 05/2018.  He completed 6 cycles of chemotherapy with rituximab, cyclophosphamide, doxorubicin, vincristine, and prednisone and is now in surveillance.  He had a baseline echo 06/2018 that revealed LVEF 50 to 55% and normal diastolic function.  He had apical  hypokinesis and GLS was -13.9. Heart rate was poorly controlled so metoprolol was added to his regimen.   He was seen in he ED and BNP was mildly elevated to 283.  He was started on lasix 20mg  daily and has noticed an improvement in his breathing.  He was referred for an echo 06/28/19 that revealed LVEF 50-55%.  GLS was -10.1%. The RV had mildly reduced systolic function.  He noted frequent urination and his blood pressure was poorly controlled so Lasix was switched to hydrochlorothiazide.  There was concern that his symptoms could be due to ischemia.  He underwent left heart catheterization 08/2019.  He was found to have significant two-vessel CAD including sequential 60 and 70% mid LAD lesions and chronic total occlusion of the mid RCA.  There were bridging left to right collaterals.  Right atrial pressure was 13.  Pulmonary capillary wedge pressure was 20 to 25 mmHg.  There was a consideration for two-vessel CABG versus medical management.  He was not deemed to be a good candidate for surgery.  He was started on Imdur.  Since then he has not had any chest pain and his breathing has been stable.  He struggled with 3 kidney stones and frequent urination. HCTZ was reduced and Losartan was increased.  At his last appointment he noted gaining more weight, but felt more confident about increasing his exercise. He was enjoying the PREP program. He continued to have shortness of breath at times that he attributed to his weight and to forgetting to breathe during activity. He presented to the ED 05/19/21 following several days of a  cough. CXR showed possible mild pulmonary edema, but BNP was not elevated. Treatment with steroids and doxycycline was recommended. On 10/13/2021 he was seen in the ED with URI symptoms for one week prior, and his partner tested positive for COVID. CXR showed likely pneumonia. He potentially had COVID-pneumonia, but was treated as bacterial due to longevity of symptoms.  Today, he is feeling  well. At this time he feels his breathing has returned to baseline, and he denies experiencing issues with edema. He has successfully completed the PREP program and lost 14 lbs and 4 inches off his waist. He continues to work out 2-3 times a week and lost 12 more lbs. While exercising he has no chest pain. He now has several friends who also work out and support each other. Lately he follows his restructured diet. His main focus is cutting calories. He denies any palpitations, lightheadedness, headaches, syncope, orthopnea, or PND.   Past Medical History:  Diagnosis Date   Anticoagulant long-term use    xarelto--- takes for AFib,  managed by pcp, Joseph Alain Lane   Benign localized prostatic hyperplasia with lower urinary tract symptoms (LUTS)    CAD (coronary artery disease) cardiologist--  [name]   hx of known CAD obstruction w/ collaterals (cath done @ [facility] 12-31-2011) ;   last cardiac cath 08-13-2019  showed sig. 2V CAD involving proxLAD and CTO of the RCA/  chronic total occlusion midRCA w/ bridging and L>R collaterals   Diastolic CHF ([place])    dx [id number] hospital admission (followed by pcp)   Diffuse large B cell lymphoma [facility]) oncologist-- Joseph [name]--- in remission   dx 08/ 2019 -- bx left tonsill mass-- involving lymph nodes--- completed chemotherapy 10-12-2018   DJD (degenerative joint disease)    Dysthymic disorder    Elevated brain natriuretic peptide (BNP) level    Environmental allergies    GAD (generalized anxiety disorder)    GERD (gastroesophageal reflux disease)    History of falling    multiple times   History of Graves' disease    1987 s/p total thyroidectomy   History of kidney stones    History of scabies    07/ 2014  resolved   History of syncope    per D/C note in epic 02-21-2013 ?vasovagal   History of vertebral compression fracture    05/ 2014 --  L1, no surgical intervention   HLD (hyperlipidemia)    Hyperlipidemia    Hypertension    Hypogonadism in  male    IDA (iron deficiency anemia)    Lumbago    OA (osteoarthritis)    OSA on CPAP    cpap machine-settings 17   Osteoporosis    Severe   Persistent atrial fibrillation [facility])    cardiologist--- Joseph Alfonso Patten.    Plaque psoriasis    dermatologist--- Joseph Harriett Sine--- currently taking otezla   Post-surgical hypothyroidism    followed by pcp---  s/p total thyroidectomy for graves disease in 1987   Pure hypercholesterolemia 02/10/2021   Renal calculus, right    Thrombocytopenia ([place])    Unsteady gait    uses walker   Wears hearing aid in both ears     Past Surgical History:  Procedure Laterality Date   APPENDECTOMY  2011 approx.    CARDIAC CATHETERIZATION  12-31-2011   Joseph j. Beatrix Fetters @[facility]    normal LVF w/ multivessel CAD,  occluded RCA w/ colleterals   CATARACT EXTRACTION W/ INTRAOCULAR LENS  IMPLANT, BILATERAL  2016 approx.   COLONOSCOPY  CYSTOSCOPY WITH RETROGRADE PYELOGRAM, URETEROSCOPY AND STENT PLACEMENT Left 11/07/2017   Procedure: CYSTOSCOPY WITH RETROGRADE PYELOGRAM, URETEROSCOPY AND STENT PLACEMENT;  Surgeon: Cleon Gustin, MD;  Location: [facility];  Service: Urology;  Laterality: Left;   CYSTOSCOPY WITH RETROGRADE PYELOGRAM, URETEROSCOPY AND STENT PLACEMENT Right 10/22/2019   Procedure: CYSTOSCOPY WITH RETROGRADE PYELOGRAM, URETEROSCOPY AND STENT PLACEMENT;  Surgeon: Cleon Gustin, MD;  Location: [facility];  Service: Urology;  Laterality: Right;   CYSTOSCOPY/URETEROSCOPY/HOLMIUM LASER/STENT PLACEMENT Right 03/11/2017   Procedure: CYSTOSCOPY/URETEROSCOPYSTENT PLACEMENT right ureter retrograde pylegram;  Surgeon: Cleon Gustin, MD;  Location: [facility];  Service: Urology;  Laterality: Right;   HARDWARE REMOVAL  10/05/2012   Procedure: HARDWARE REMOVAL;  Surgeon: [name], MD;  [facility]: [facility];  Service: Orthopedics;  Laterality: Right;  REMOVING  STRYKER  GAMMA NAIL   HARDWARE REMOVAL Right 07/03/2013   Procedure: HARDWARE REMOVAL RIGHT  TIBIA ;  Surgeon: Rozanna Box, MD;  Location: [place];  Service: Orthopedics;  Laterality: Right;   HIP CLOSED REDUCTION Right 10/15/2013   Procedure: CLOSED MANIPULATION HIP;  Surgeon: [name], MD;  [facility];  Service: Orthopedics;  Laterality: Right;   HOLMIUM LASER APPLICATION Right 05/13/1516   Procedure: HOLMIUM LASER APPLICATION;  Surgeon: Cleon Gustin, MD;  [facility];  Service: Urology;  Laterality: Right;   HOLMIUM LASER APPLICATION Left 03/26/736   Procedure: HOLMIUM LASER APPLICATION;  Surgeon: Cleon Gustin, MD;  [facility];  Service: Urology;  Laterality: Left;   HOLMIUM LASER APPLICATION Left 1/0/6269   Procedure: HOLMIUM LASER APPLICATION;  Surgeon: Cleon Gustin, MD;  [facility];  Service: Urology;  Laterality: Left;   HOLMIUM LASER APPLICATION Right 4/85/4627   Procedure: HOLMIUM LASER APPLICATION;  Surgeon: Cleon Gustin, MD;  Location: [facility];  Service: Urology;  Laterality: Right;   INCISION AND DRAINAGE HIP Right 11/16/2013   Procedure: IRRIGATION AND DEBRIDEMENT RIGHT HIP;  Surgeon: [name], MD;  [facility];  Service: Orthopedics;  Laterality: Right;   INTRAVASCULAR PRESSURE WIRE/FFR STUDY N/A 08/13/2019   Procedure: INTRAVASCULAR PRESSURE WIRE/FFR STUDY;  Surgeon: Nelva Bush, MD;  Location: [place] [facility];  Service: Cardiovascular;  Laterality: N/A;   IR IMAGING GUIDED PORT INSERTION  06/22/2018   IR NEPHROSTOMY PLACEMENT LEFT  03/28/2019   IR URETERAL STENT LEFT NEW ACCESS W/O SEP NEPHROSTOMY CATH  10/24/2017   IR URETERAL STENT LEFT NEW ACCESS W/O SEP NEPHROSTOMY CATH  03/26/2019   NEPHROLITHOTOMY Right 02/08/2017   Procedure: NEPHROLITHOTOMY PERCUTANEOUS WITH SURGEON ACCESS;  Surgeon: Cleon Gustin, MD;  [facility];  Service: Urology;  Laterality: Right;   NEPHROLITHOTOMY Left 10/24/2017   Procedure: NEPHROLITHOTOMY PERCUTANEOUS;  Surgeon: Cleon Gustin, MD;   [facility];  Service: Urology;  Laterality: Left;   NEPHROLITHOTOMY Left 03/26/2019   Procedure: NEPHROLITHOTOMY PERCUTANEOUS;  Surgeon: Cleon Gustin, MD;  [facility];  Service: Urology;  Laterality: Left;  2 HRS   NEPHROLITHOTOMY Left 04/17/2019   Procedure: NEPHROLITHOTOMY PERCUTANEOUS;  Surgeon: Cleon Gustin, MD;  [facility];  Service: Urology;  Laterality: Left;  2 HRS   ORIF TIBIA FRACTURE Right 02/06/2013   Procedure: OPEN REDUCTION INTERNAL FIXATION (ORIF) TIBIA FRACTURE WITH IM ROD FIBULA;  Surgeon: Rozanna Box, MD;  Location: [place];  Service: Orthopedics;  Laterality: Right;   ORIF TIBIA FRACTURE Right 07/03/2013   Procedure: RIGHT TIBIA NON UNION REPAIR ;  Surgeon:  Rozanna Box, MD;  Location: [place];  Service: Orthopedics;  Laterality: Right;   ORIF WRIST FRACTURE  10/02/2012   Procedure: OPEN REDUCTION INTERNAL FIXATION (ORIF) WRIST FRACTURE;  Surgeon: Roseanne Kaufman, MD;  [facility];  Service: Orthopedics;  Laterality: Right;  WITH   ANTIBIOTIC  CEMENT   ORIF WRIST FRACTURE Left 10/28/2013   Procedure: OPEN REDUCTION INTERNAL FIXATION (ORIF) WRIST FRACTURE with allograft;  Surgeon: Roseanne Kaufman, MD;  [facility];  Service: Orthopedics;  Laterality: Left;  DVR Plate   QUADRICEPS TENDON REPAIR Left 07/15/2017   Procedure: REPAIR QUADRICEP TENDON;  Surgeon: Frederik Pear, MD;  Location: [place];  Service: Orthopedics;  Laterality: Left;   RIGHT/LEFT HEART CATH AND CORONARY ANGIOGRAPHY N/A 08/13/2019   Procedure: RIGHT/LEFT HEART CATH AND CORONARY ANGIOGRAPHY;  Surgeon: Nelva Bush, MD;  Location: [place] [facility];  Service: Cardiovascular;  Laterality: N/A;   THYROIDECTOMY  02/1986   TOTAL HIP ARTHROPLASTY Right 03-16-2016   @[facility]    TOTAL HIP REVISION  10/05/2012   Procedure: TOTAL HIP REVISION;  Surgeon: [name], MD;  [facility];  Service: Orthopedics;  Laterality: Right;  RIGHT TOTAL HIP REVISION   TOTAL HIP REVISION Right  09/17/2013   Procedure: REVISION RIGHT TOTAL HIP ARTHROPLASTY ;  Surgeon: [name], MD;  [facility];  Service: Orthopedics;  Laterality: Right;   TOTAL HIP REVISION Right 10/26/2013   Procedure: REVISION RIGHT TOTAL HIP ARTHROPLASTY;  Surgeon: [name], MD;  [facility];  Service: Orthopedics;  Laterality: Right;   TOTAL KNEE ARTHROPLASTY Bilateral right 03-15-2011;  left 06-30-2011   TOTAL KNEE REVISION Left 04/11/2017   Procedure: TOTAL KNEE REVISION PATELLA and TIBIA;  Surgeon: Frederik Pear, MD;  Location: [place];  Service: Orthopedics;  Laterality: Left;     Current Outpatient Medications  Medication Sig Dispense Refill   acetaminophen (TYLENOL) 500 MG tablet Take 1,000 mg by mouth daily as needed for moderate pain.     albuterol (VENTOLIN HFA) 108 (90 Base) MCG/ACT inhaler Inhale 2 puffs into the lungs every 6 (six) hours as needed for wheezing or shortness of breath.     alfuzosin (UROXATRAL) 10 MG 24 hr tablet TAKE 1 TABLET AT BEDTIME 90 tablet 3   aspirin EC 81 MG tablet Take 81 mg by mouth daily.     atorvastatin (LIPITOR) 80 MG tablet Take 1 tablet (80 mg total) by mouth daily. 90 tablet 3   benzonatate (TESSALON) 100 MG capsule Take 1 capsule (100 mg total) by mouth every 8 (eight) hours. 21 capsule 0   buPROPion (WELLBUTRIN XL) 300 MG 24 hr tablet TAKE 1 TABLET EVERY DAY WITH BREAKFAST 90 tablet 3   Calcium Citrate-Vitamin D (CALCIUM CITRATE + D PO) Take 1 tablet by mouth 2 (two) times daily. 1200mg  of Calcium and 1000units vitamin D3     Cholecalciferol (VITAMIN D-3 PO) Take 2,000 Units by mouth 2 (two) times daily.      clonazePAM (KLONOPIN) 1 MG tablet Take 1 tablet (1 mg total) by mouth every 6 (six) hours as needed. for anxiety 120 tablet 0   denosumab (PROLIA) 60 MG/ML SOSY injection Inject 60 mg as directed every 6 (six) months. Last injection: Feb 2020 Next injection: August 2020     diltiazem (CARDIZEM CD) 240 MG 24 hr capsule TAKE 1 CAPSULE EVERY DAY 90  capsule 3   DULoxetine (CYMBALTA) 60 MG capsule TAKE 1 CAPSULE TWICE DAILY 180 capsule 3   EPINEPHrine  0.3 mg/0.3 mL IJ SOAJ injection See admin instructions.     ezetimibe (ZETIA) 10 MG tablet TAKE 1 TABLET EVERY DAY 90 tablet 2   ferrous sulfate 325 (65 FE) MG tablet Take 325 mg by mouth 2 (two) times daily with a meal.      hydrochlorothiazide (HYDRODIURIL) 12.5 MG tablet TAKE 1 TABLET (12.5 MG TOTAL) BY MOUTH DAILY. 90 tablet 3   HYDROcodone-acetaminophen (NORCO) 7.5-325 MG tablet Take 1 tablet by mouth every 6 (six) hours as needed for severe pain. 120 tablet 0   isosorbide mononitrate (IMDUR) 30 MG 24 hr tablet TAKE 1 TABLET (30 MG TOTAL) BY MOUTH DAILY. 90 tablet 2   levothyroxine (SYNTHROID) 200 MCG tablet TAKE 1 TABLET EVERY DAY AT 6AM 90 tablet 3   loratadine (CLARITIN) 10 MG tablet Take 10 mg by mouth daily.      loratadine (CLARITIN) 10 MG tablet 1 tablet     losartan (COZAAR) 50 MG tablet TAKE 1 TABLET EVERY DAY 90 tablet 2   Magnesium 500 MG CAPS Take 1 capsule (500 mg total) by mouth daily. (Patient taking differently: Take 500 mg by mouth every evening.) 30 capsule 1   metoprolol tartrate (LOPRESSOR) 25 MG tablet Take 1 tablet (25 mg total) by mouth 2 (two) times daily. 180 tablet 3   Multiple Vitamin (MULTIVITAMIN WITH MINERALS) TABS tablet Take 1 tablet by mouth daily. Men's One-A-Day 50+     MYRBETRIQ 25 MG TB24 tablet TAKE 1 TABLET EVERY DAY 90 tablet 3   nitroGLYCERIN (NITROSTAT) 0.4 MG SL tablet Place 0.4 mg under the tongue every 5 (five) minutes as needed for chest pain. x3 doses as needed for chest pain     omeprazole (PRILOSEC) 40 MG capsule TAKE 1 CAPSULE EVERY DAY BEFORE BREAKFAST 90 capsule 3   Potassium Citrate 15 MEQ (1620 MG) TBCR TAKE 1 TABLET TWICE DAILY 180 tablet 3   Probiotic Product (PROBIOTIC PO) Take 1 capsule by mouth daily with breakfast.     Risankizumab-rzaa (SKYRIZI PEN [place]) Inject 150 mg into the skin every 3 (three) months.     rivaroxaban (XARELTO)  10 MG TABS tablet Take 1 tablet (10 mg total) by mouth daily. 90 tablet 3   SKYRIZI PEN 150 MG/ML SOAJ      solifenacin (VESICARE) 5 MG tablet      tacrolimus (PROTOPIC) 0.1 % ointment See admin instructions.     testosterone enanthate (DELATESTRYL) 200 MG/ML injection INJECT 1ML (200MG ) INTO THE MUSCLE EVERY 14 DAYS. DISCARD VIAL AFTER 28 DAYS. 5 mL 3   tiZANidine (ZANAFLEX) 4 MG tablet TAKE 1 TABLET EVERY 6 HOURS AS NEEDED FOR MUSCLE SPASM(S) 360 tablet 1   vitamin C (ASCORBIC ACID) 500 MG tablet Take 500 mg by mouth daily.     amoxicillin (AMOXIL) 500 MG capsule Take 2,000 mg by mouth See admin instructions. Take 4 capsules (2000 mg) by mouth 1 hour prior to dental work (Patient not taking: Reported on 10/28/2021)     No current facility-administered medications for this visit.    Allergies:   Short ragweed pollen ext    Social History:  The patient  reports that he quit smoking about 9 years ago. His smoking use included cigarettes. He has a 50.00 pack-year smoking history. He has never used smokeless tobacco. He reports that he does not currently use alcohol. He reports that he does not use drugs.   Family History:  The patient's family history includes Alcohol abuse in his father and  sister; Arthritis in his mother; Asthma in his father; CAD (age of onset: 60) in his father.    ROS:   Please see the history of present illness. All other systems are reviewed and negative.    PHYSICAL EXAM: VS:  BP 122/64 (BP Location: Left Arm, Patient Position: Sitting, Cuff Size: Large)    Pulse 70    Ht 6\' 3"  (1.905 m)    Wt 287 lb 8 oz (130.4 kg)    BMI 35.94 kg/m  , BMI Body mass index is 35.94 kg/m. GENERAL:  Well appearing HEENT: Pupils equal round and reactive, fundi not visualized, oral mucosa unremarkable NECK:  No jugular venous distention, waveform within normal limits, carotid upstroke brisk and symmetric, no bruits LUNGS:  CTAB HEART:  Irregularly irregular.  PMI not displaced or  sustained,S1 and S2 within normal limits, no S3, no S4, no clicks, no rubs, no murmurs ABD:  Flat, positive bowel sounds normal in frequency in pitch, no bruits, no rebound, no guarding, no midline pulsatile mass, no hepatomegaly, no splenomegaly EXT:  2 plus pulses throughout, trace edema, no cyanosis no clubbing SKIN:  No rashes no nodules NEURO:  Cranial nerves II through XII grossly intact, motor grossly intact throughout PSYCH:  Cognitively intact, oriented to person place and time   EKG:   10/28/2021: Atrial fibrillation. Rate 70 bpm. LAD. Prior anteroseptal infarct. 04/01/2021: EKG was not ordered. 02/10/2021: Atrial fibrillation. Rate 83 bpm. Prior anteroseptal infarct, LAD. 08/11/2020: Atrial fibrillation.  Rate 84 bpm. R axis deviation.  02/08/2020: Atrial fibrillation.  Rate 79 bpm.  LAD.  Prior anterior infarct. 08/06/19: Atrial fibrillation.  Rate 86 bpm.  Prior anteroseptal infarct.  Poor R wave progression.  08/17/2018: EKG was not ordered. 07/04/18: Atrial fibrillation.  Rate 118 bpm.  Right axis deviation. 12/26/17: Atrial fibrillation.  Rate 95 bpm.  Cannot rule out prior anterior infarct. 06/08/2017: Atrial fibrillation. Rate 71 bpm. Left axis deviation. Nonspecific IVCD.  LHC 08/13/19: Conclusions: Significant two-vessel coronary artery disease, including sequential 60% proximal and 70% mid LAD stenosis, which are hemodynamically significant by DFR and also involve large D2 branch, as well as chronic total occlusion of the mid RCA with bridging and left-to-right collaterals. Mildly elevated left heart and pulmonary artery pressures.  Prominent v-waves noted on PCWP; question if mitral regurgitation is more severe than appreciated on recent transthoracic echocardiogram. Moderately elevated right heart filling pressures. Normal Fick cardiac output/index.  Diagnostic Dominance: Right   RHC RA (mean): 13 mmHg RV (S/EDP): 49/14 mmHg PA (S/D, mean): 49/22 (31) mmHg PCWP (mean):  20-25 mmHg with prominent v-waves  Ao sat: 93% PA sat: 70%  Fick CO: 85 L/min Fick CI: 3.3 L/min/m^2  Echo 06/28/2019:  1. Left ventricular ejection fraction, by visual estimation, is 50 to  55%. The left ventricle has normal function. Left ventricular septal wall  thickness was normal. Normal left ventricular posterior wall thickness.  There is no left ventricular hypertrophy.   2. Left ventricular diastolic function could not be evaluated pattern of  LV diastolic filling.   3. GLS = -10.1%.   4. Global right ventricle has mildly reduced systolic function.The right  ventricular size is normal. No increase in right ventricular wall  thickness.   5. Left atrial size was moderately dilated.   6. Right atrial size was moderately dilated.   7. The mitral valve is normal in structure. Mild mitral valve  regurgitation.   8. The tricuspid valve is normal in structure. Tricuspid valve  regurgitation  was not visualized by color flow Doppler.   9. The aortic valve The aortic valve is grossly normal Aortic valve  regurgitation was not visualized by color flow Doppler. Structurally  normal aortic valve, with no evidence of sclerosis or stenosis.  10. The pulmonic valve was grossly normal. Pulmonic valve regurgitation is not visualized by color flow Doppler.  11. The atrial septum is grossly normal.   Right LE Venous Doppler 06/09/2019: FINDINGS: Contralateral Common Femoral Vein: Respiratory phasicity is normal and symmetric with the symptomatic side. No evidence of thrombus. Normal compressibility.   Normal flow, compressibility, and augmentation within the RIGHT distal common femoral, proximal profunda femoral, proximal greater saphenous, entire femoral and popliteal veins. The calf veins are difficult to visualized.   RIGHT LOWER extremity subcutaneous edema noted.   IMPRESSION: No evidence of RIGHT LOWER extremity deep venous thrombosis. Calf veins difficult to visualize due to  body habitus.   RIGHT LOWER extremity subcutaneous edema.  Echo 11/03/16: Study Conclusions   - Left ventricle: Systolic function was normal. The estimated   ejection fraction was in the range of 60% to 65%. The study is   not technically sufficient to allow evaluation of LV diastolic   function. - Left atrium: Severely dilated. - Right atrium: Severely dilated.   Impressions:   - Technically difficult study with poor echo windows. The LVEF is   grossly normal at 60-65%, there is severe biatrial enlargment,   the IVC is dilated.   Recent Labs: 05/19/2021: B Natriuretic Peptide 124.1 06/08/2021: ALT 42; BUN 26; Creatinine 1.38; Hemoglobin 17.4; Platelets 130; Potassium 4.7; Sodium 140    Lipid Panel    Component Value Date/Time   CHOL 101 02/24/2021 1037   CHOL 115 10/17/2019 1127   TRIG 91 02/24/2021 1037   HDL 47 02/24/2021 1037   HDL 55 10/17/2019 1127   CHOLHDL 2.1 02/24/2021 1037   VLDL 18 02/24/2021 1037   LDLCALC 36 02/24/2021 1037   LDLCALC 49 10/17/2019 1127    Wt Readings from Last 3 Encounters:  10/28/21 287 lb 8 oz (130.4 kg)  10/13/21 287 lb 0.6 oz (130.2 kg)  10/07/21 287 lb (130.2 kg)     ASSESSMENT AND PLAN:  Longstanding persistent atrial fibrillation ([place]) He remains in atrial fibrillation.  Rates are well-controlled and he is asymptomatic.  Continue diltiazem, metoprolol, and Xarelto.  Hypothyroidism Thyroid function to be checked today for his PCP.  CAD (coronary artery disease) Medically managed moderate to severe CAD.  He is doing extremely well and exercising regularly.  He has no angina.  Continue aspirin, atorvastatin, and metoprolol.  Check fasting lipids and a CMP today.  Continue Imdur.  Chronic diastolic heart failure ([place]) He is euvolemic and doing well.  Blood pressures well-controlled.  Continue current regimen.  Essential hypertension He is euvolemic and doing well.  Blood pressures well-controlled.  Continue current  regimen.    Current medicines are reviewed at length with the patient today.  The patient does not have concerns regarding medicines.  The following changes have been made: None  Labs/ tests ordered today include:   Orders Placed This Encounter  Procedures   T4, free   TSH   Lipid panel   Comprehensive metabolic panel   Testosterone   PSA   EKG 12-Lead    Disposition:   FU with Jossiah Smoak C. Oval Linsey, MD, [facility] in 6 months.     I,Mathew Stumpf,acting as a Education administrator for Joseph Latch, MD.,have documented all relevant documentation on the  behalf of Joseph Latch, MD,as directed by  Joseph Latch, MD while in the presence of Joseph Latch, MD.  I, Edison Oval Linsey, MD have reviewed all documentation for this visit.  The documentation of the exam, diagnosis, procedures, and orders on 10/28/2021 are all accurate and complete.  Signed, Jessup Ogas C. Oval Linsey, MD, [facility]  10/28/2021 10:36 AM    [place]

## 2021-10-28 NOTE — Assessment & Plan Note (Signed)
He is euvolemic and doing well.  Blood pressures well-controlled.  Continue current regimen.

## 2021-10-28 NOTE — Assessment & Plan Note (Signed)
Thyroid function to be checked today for his PCP.

## 2021-10-28 NOTE — Assessment & Plan Note (Signed)
He remains in atrial fibrillation.  Rates are well-controlled and he is asymptomatic.  Continue diltiazem, metoprolol, and Xarelto.

## 2021-10-28 NOTE — Patient Instructions (Signed)
Medication Instructions:  Your physician recommends that you continue on your current medications as directed. Please refer to the Current Medication list given to you today.   *If you need a refill on your cardiac medications before your next appointment, please call your pharmacy*   Lab Work: LP/CMET/TSH/FT4/TESTOSTERONE TODAY   If you have labs (blood work) drawn today and your tests are completely normal, you will receive your results only by: [place] (if you have MyChart) OR A paper copy in the mail If you have any lab test that is abnormal or we need to change your treatment, we will call you to review the results.   Testing/Procedures: NONE   Follow-Up: At [facility], you and your health needs are our priority.  As part of our continuing mission to provide you with exceptional heart care, we have created designated Provider Care Teams.  These Care Teams include your primary Cardiologist (physician) and Advanced Practice Providers (APPs -  Physician Assistants and Nurse Practitioners) who all work together to provide you with the care you need, when you need it.  We recommend signing up for the patient portal called "MyChart".  Sign up information is provided on this After Visit Summary.  MyChart is used to connect with patients for Virtual Visits (Telemedicine).  Patients are able to view lab/test results, encounter notes, upcoming appointments, etc.  Non-urgent messages can be sent to your provider as well.   To learn more about what you can do with MyChart, go to [email or web address].    Your next appointment:   6 month(s)  The format for your next appointment:   In Person  Provider:   Skeet Latch, MD

## 2021-10-29 LAB — COMPREHENSIVE METABOLIC PANEL
ALT: 37 IU/L (ref 0–44)
AST: 34 IU/L (ref 0–40)
Albumin/Globulin Ratio: 1.6 (ref 1.2–2.2)
Albumin: 3.9 g/dL (ref 3.7–4.7)
Alkaline Phosphatase: 65 IU/L (ref 44–121)
BUN/Creatinine Ratio: 20 (ref 10–24)
BUN: 30 mg/dL — ABNORMAL HIGH (ref 8–27)
Bilirubin Total: 0.8 mg/dL (ref 0.0–1.2)
CO2: 26 mmol/L (ref 20–29)
Calcium: 9.8 mg/dL (ref 8.6–10.2)
Chloride: 97 mmol/L (ref 96–106)
Creatinine, Ser: 1.51 mg/dL — ABNORMAL HIGH (ref 0.76–1.27)
Globulin, Total: 2.5 g/dL (ref 1.5–4.5)
Glucose: 95 mg/dL (ref 70–99)
Potassium: 4.8 mmol/L (ref 3.5–5.2)
Sodium: 138 mmol/L (ref 134–144)
Total Protein: 6.4 g/dL (ref 6.0–8.5)
eGFR: 48 mL/min/{1.73_m2} — ABNORMAL LOW (ref >59)

## 2021-10-29 LAB — PSA: Prostate Specific Ag, Serum: 4 ng/mL (ref 0.0–4.0)

## 2021-10-29 LAB — LIPID PANEL
Chol/HDL Ratio: 2.3 ratio (ref 0.0–5.0)
Cholesterol, Total: 102 mg/dL (ref 100–199)
HDL: 45 mg/dL (ref >39)
LDL Chol Calc (NIH): 43 mg/dL (ref 0–99)
Triglycerides: 64 mg/dL (ref 0–149)
VLDL Cholesterol Cal: 14 mg/dL (ref 5–40)

## 2021-10-29 LAB — TESTOSTERONE: Testosterone: 1030 ng/dL — ABNORMAL HIGH (ref 264–916)

## 2021-10-29 LAB — T4, FREE: Free T4: 2.19 ng/dL — ABNORMAL HIGH (ref 0.82–1.77)

## 2021-10-29 LAB — TSH: TSH: 0.185 u[IU]/mL — ABNORMAL LOW (ref 0.450–4.500)

## 2021-11-04 DIAGNOSIS — F410 Panic disorder [episodic paroxysmal anxiety] without agoraphobia: Secondary | ICD-10-CM | POA: Diagnosis not present

## 2021-11-04 DIAGNOSIS — F331 Major depressive disorder, recurrent, moderate: Secondary | ICD-10-CM | POA: Diagnosis not present

## 2021-11-24 DIAGNOSIS — F410 Panic disorder [episodic paroxysmal anxiety] without agoraphobia: Secondary | ICD-10-CM | POA: Diagnosis not present

## 2021-11-24 DIAGNOSIS — F331 Major depressive disorder, recurrent, moderate: Secondary | ICD-10-CM | POA: Diagnosis not present

## 2021-11-25 DIAGNOSIS — I739 Peripheral vascular disease, unspecified: Secondary | ICD-10-CM | POA: Diagnosis not present

## 2021-11-25 DIAGNOSIS — J301 Allergic rhinitis due to pollen: Secondary | ICD-10-CM | POA: Diagnosis not present

## 2021-11-25 DIAGNOSIS — B351 Tinea unguium: Secondary | ICD-10-CM | POA: Diagnosis not present

## 2021-11-25 DIAGNOSIS — J3089 Other allergic rhinitis: Secondary | ICD-10-CM | POA: Diagnosis not present

## 2021-12-02 DIAGNOSIS — Z8572 Personal history of non-Hodgkin lymphomas: Secondary | ICD-10-CM | POA: Diagnosis not present

## 2021-12-02 DIAGNOSIS — F32A Depression, unspecified: Secondary | ICD-10-CM | POA: Diagnosis not present

## 2021-12-02 DIAGNOSIS — G4733 Obstructive sleep apnea (adult) (pediatric): Secondary | ICD-10-CM | POA: Diagnosis not present

## 2021-12-02 DIAGNOSIS — C8590 Non-Hodgkin lymphoma, unspecified, unspecified site: Secondary | ICD-10-CM | POA: Diagnosis not present

## 2021-12-02 DIAGNOSIS — Z9181 History of falling: Secondary | ICD-10-CM | POA: Diagnosis not present

## 2021-12-02 DIAGNOSIS — Z5181 Encounter for therapeutic drug level monitoring: Secondary | ICD-10-CM | POA: Diagnosis not present

## 2021-12-02 DIAGNOSIS — M810 Age-related osteoporosis without current pathological fracture: Secondary | ICD-10-CM | POA: Diagnosis not present

## 2021-12-02 DIAGNOSIS — E039 Hypothyroidism, unspecified: Secondary | ICD-10-CM | POA: Diagnosis not present

## 2021-12-02 DIAGNOSIS — E291 Testicular hypofunction: Secondary | ICD-10-CM | POA: Diagnosis not present

## 2021-12-02 DIAGNOSIS — R7989 Other specified abnormal findings of blood chemistry: Secondary | ICD-10-CM | POA: Diagnosis not present

## 2021-12-02 DIAGNOSIS — K219 Gastro-esophageal reflux disease without esophagitis: Secondary | ICD-10-CM | POA: Diagnosis not present

## 2021-12-02 DIAGNOSIS — Z9221 Personal history of antineoplastic chemotherapy: Secondary | ICD-10-CM | POA: Diagnosis not present

## 2021-12-02 DIAGNOSIS — Z8719 Personal history of other diseases of the digestive system: Secondary | ICD-10-CM | POA: Diagnosis not present

## 2021-12-02 DIAGNOSIS — I4891 Unspecified atrial fibrillation: Secondary | ICD-10-CM | POA: Diagnosis not present

## 2021-12-08 ENCOUNTER — Inpatient Hospital Stay: Payer: Medicare PPO

## 2021-12-08 ENCOUNTER — Inpatient Hospital Stay: Payer: Medicare PPO | Attending: Hematology | Admitting: Hematology

## 2021-12-08 ENCOUNTER — Other Ambulatory Visit: Payer: Self-pay

## 2021-12-08 VITALS — BP 118/60 | HR 76 | Temp 98.2°F | Resp 18 | Ht 75.0 in | Wt 285.9 lb

## 2021-12-08 DIAGNOSIS — Z9221 Personal history of antineoplastic chemotherapy: Secondary | ICD-10-CM | POA: Insufficient documentation

## 2021-12-08 DIAGNOSIS — C8330 Diffuse large B-cell lymphoma, unspecified site: Secondary | ICD-10-CM

## 2021-12-08 DIAGNOSIS — Z8572 Personal history of non-Hodgkin lymphomas: Secondary | ICD-10-CM | POA: Insufficient documentation

## 2021-12-08 DIAGNOSIS — Z87891 Personal history of nicotine dependence: Secondary | ICD-10-CM | POA: Insufficient documentation

## 2021-12-08 LAB — CBC WITH DIFFERENTIAL/PLATELET
Abs Immature Granulocytes: 0.02 10*3/uL (ref 0.00–0.07)
Basophils Absolute: 0 10*3/uL (ref 0.0–0.1)
Basophils Relative: 0 %
Eosinophils Absolute: 0.1 10*3/uL (ref 0.0–0.5)
Eosinophils Relative: 2 %
HCT: 51 % (ref 39.0–52.0)
Hemoglobin: 16.9 g/dL (ref 13.0–17.0)
Immature Granulocytes: 0 %
Lymphocytes Relative: 26 %
Lymphs Abs: 1.6 10*3/uL (ref 0.7–4.0)
MCH: 31 pg (ref 26.0–34.0)
MCHC: 33.1 g/dL (ref 30.0–36.0)
MCV: 93.4 fL (ref 80.0–100.0)
Monocytes Absolute: 0.4 10*3/uL (ref 0.1–1.0)
Monocytes Relative: 7 %
Neutro Abs: 4 10*3/uL (ref 1.7–7.7)
Neutrophils Relative %: 65 %
Platelets: 139 10*3/uL — ABNORMAL LOW (ref 150–400)
RBC: 5.46 MIL/uL (ref 4.22–5.81)
RDW: 14.7 % (ref 11.5–15.5)
WBC: 6.1 10*3/uL (ref 4.0–10.5)
nRBC: 0 % (ref 0.0–0.2)

## 2021-12-08 LAB — CMP (CANCER CENTER ONLY)
ALT: 30 U/L (ref 0–44)
AST: 31 U/L (ref 15–41)
Albumin: 3.9 g/dL (ref 3.5–5.0)
Alkaline Phosphatase: 57 U/L (ref 38–126)
Anion gap: 5 (ref 5–15)
BUN: 25 mg/dL — ABNORMAL HIGH (ref 8–23)
CO2: 30 mmol/L (ref 22–32)
Calcium: 9.6 mg/dL (ref 8.9–10.3)
Chloride: 101 mmol/L (ref 98–111)
Creatinine: 1.35 mg/dL — ABNORMAL HIGH (ref 0.61–1.24)
GFR, Estimated: 54 mL/min — ABNORMAL LOW (ref >60)
Glucose, Bld: 99 mg/dL (ref 70–99)
Potassium: 4.6 mmol/L (ref 3.5–5.1)
Sodium: 136 mmol/L (ref 135–145)
Total Bilirubin: 1.3 mg/dL — ABNORMAL HIGH (ref 0.3–1.2)
Total Protein: 6.9 g/dL (ref 6.5–8.1)

## 2021-12-08 LAB — LACTATE DEHYDROGENASE: LDH: 195 U/L — ABNORMAL HIGH (ref 98–192)

## 2021-12-08 NOTE — Progress Notes (Signed)
HEMATOLOGY/ONCOLOGY CLINIC NOTE  Date of Service: 12/08/2021   Patient Care Team: Cassandria Anger, MD as PCP - General (Internal Medicine) Skeet Latch, MD as PCP - Cardiology (Cardiology) Brunetta Genera, MD as Consulting Physician (Hematology) McKenzie, Candee Furbish, MD as Consulting Physician (Urology) Jackquline Denmark, MD as Consulting Physician (Gastroenterology) Skeet Latch, MD as Attending Physician (Cardiology) Lynnell Dike, [place] as Consulting Physician (Optometry) Szabat, Darnelle Maffucci, [facility] as Pharmacist (Pharmacist)  CHIEF COMPLAINTS/PURPOSE OF CONSULTATION:  Follow Up for Diffuse Large B-Cell Lymphoma   HISTORY OF PRESENTING ILLNESS:   Please see previous note for details on initial presentation.   INTERVAL HISTORY   [name] is 76 y.o. male is here for continued evaluation and management of his large B-cell lymphoma.  He notes no acute new symptoms, No fevers no chills no night sweats no unexpected weight loss. No new fatigue. Labs done today reviewed.  MEDICAL HISTORY:  Past Medical History:  Diagnosis Date   Anticoagulant long-term use    xarelto--- takes for AFib,  managed by pcp, Dr Alain Marion   Benign localized prostatic hyperplasia with lower urinary tract symptoms (LUTS)    CAD (coronary artery disease) cardiologist--  dr t. Oval Linsey   hx of known CAD obstruction w/ collaterals (cath done @ [facility] 12-31-2011) ;   last cardiac cath 08-13-2019  showed sig. 2V CAD involving proxLAD and CTO of the RCA/  chronic total occlusion midRCA w/ bridging and L>R collaterals   Diastolic CHF ([place])    dx [phone] hospital admission (followed by pcp)   Diffuse large B cell lymphoma [facility]) oncologist-- dr Irene Limbo--- in remission   dx 08/ 2019 -- bx left tonsill mass-- involving lymph nodes--- completed chemotherapy 10-12-2018   DJD (degenerative joint disease)    Dysthymic disorder    Elevated brain natriuretic peptide (BNP) level    Environmental  allergies    GAD (generalized anxiety disorder)    GERD (gastroesophageal reflux disease)    History of falling    multiple times   History of Graves' disease    1987 s/p total thyroidectomy   History of kidney stones    History of scabies    07/ 2014  resolved   History of syncope    per D/C note in epic 02-21-2013 ?vasovagal   History of vertebral compression fracture    05/ 2014 --  L1, no surgical intervention   HLD (hyperlipidemia)    Hyperlipidemia    Hypertension    Hypogonadism in male    IDA (iron deficiency anemia)    Lumbago    OA (osteoarthritis)    OSA on CPAP    cpap machine-settings 17   Osteoporosis    Severe   Persistent atrial fibrillation [facility])    cardiologist--- dr Alfonso Patten. [place]   Plaque psoriasis    dermatologist--- dr Harriett Sine--- currently taking otezla   Post-surgical hypothyroidism    followed by pcp---  s/p total thyroidectomy for graves disease in 1987   Pure hypercholesterolemia 02/10/2021   Renal calculus, right    Thrombocytopenia ([place])    Unsteady gait    uses walker   Wears hearing aid in both ears     SURGICAL HISTORY: Past Surgical History:  Procedure Laterality Date   APPENDECTOMY  2011 approx.    CARDIAC CATHETERIZATION  12-31-2011   dr j. Beatrix Fetters @[facility]    normal LVF w/ multivessel CAD,  occluded RCA w/ colleterals   CATARACT EXTRACTION W/ INTRAOCULAR LENS  IMPLANT, BILATERAL  2016 approx.   COLONOSCOPY     CYSTOSCOPY WITH RETROGRADE PYELOGRAM, URETEROSCOPY AND STENT PLACEMENT Left 11/07/2017   Procedure: CYSTOSCOPY WITH RETROGRADE PYELOGRAM, URETEROSCOPY AND STENT PLACEMENT;  Surgeon: Cleon Gustin, MD;  Location: [facility];  Service: Urology;  Laterality: Left;   CYSTOSCOPY WITH RETROGRADE PYELOGRAM, URETEROSCOPY AND STENT PLACEMENT Right 10/22/2019   Procedure: CYSTOSCOPY WITH RETROGRADE PYELOGRAM, URETEROSCOPY AND STENT PLACEMENT;  Surgeon: Cleon Gustin, MD;  Location: [facility];  Service: Urology;   Laterality: Right;   CYSTOSCOPY/URETEROSCOPY/HOLMIUM LASER/STENT PLACEMENT Right 03/11/2017   Procedure: CYSTOSCOPY/URETEROSCOPYSTENT PLACEMENT right ureter retrograde pylegram;  Surgeon: Cleon Gustin, MD;  [facility];  Service: Urology;  Laterality: Right;   HARDWARE REMOVAL  10/05/2012   Procedure: HARDWARE REMOVAL;  Surgeon: [name], MD;  [facility];  Service: Orthopedics;  Laterality: Right;  REMOVING  STRYKER  GAMMA NAIL   HARDWARE REMOVAL Right 07/03/2013   Procedure: HARDWARE REMOVAL RIGHT TIBIA ;  Surgeon: Rozanna Box, MD;  Location: [place];  Service: Orthopedics;  Laterality: Right;   HIP CLOSED REDUCTION Right 10/15/2013   Procedure: CLOSED MANIPULATION HIP;  Surgeon: [name], MD;  [facility];  Service: Orthopedics;  Laterality: Right;   HOLMIUM LASER APPLICATION Right 06/13/2670   Procedure: HOLMIUM LASER APPLICATION;  Surgeon: Cleon Gustin, MD;  [facility];  Service: Urology;  Laterality: Right;   HOLMIUM LASER APPLICATION Left 2/45/8099   Procedure: HOLMIUM LASER APPLICATION;  Surgeon: Cleon Gustin, MD;  [facility];  Service: Urology;  Laterality: Left;   HOLMIUM LASER APPLICATION Left 05/13/3824   Procedure: HOLMIUM LASER APPLICATION;  Surgeon: Cleon Gustin, MD;  [facility];  Service: Urology;  Laterality: Left;   HOLMIUM LASER APPLICATION Right 0/53/9767   Procedure: HOLMIUM LASER APPLICATION;  Surgeon: Cleon Gustin, MD;  Location: [facility];  Service: Urology;  Laterality: Right;   INCISION AND DRAINAGE HIP Right 11/16/2013   Procedure: IRRIGATION AND DEBRIDEMENT RIGHT HIP;  Surgeon: [name], MD;  [facility];  Service: Orthopedics;  Laterality: Right;   INTRAVASCULAR PRESSURE WIRE/FFR STUDY N/A 08/13/2019   Procedure: INTRAVASCULAR PRESSURE WIRE/FFR STUDY;  Surgeon: Nelva Bush, MD;  Location: [place] [facility];  Service: Cardiovascular;  Laterality: N/A;   IR IMAGING  GUIDED PORT INSERTION  06/22/2018   IR NEPHROSTOMY PLACEMENT LEFT  03/28/2019   IR URETERAL STENT LEFT NEW ACCESS W/O SEP NEPHROSTOMY CATH  10/24/2017   IR URETERAL STENT LEFT NEW ACCESS W/O SEP NEPHROSTOMY CATH  03/26/2019   NEPHROLITHOTOMY Right 02/08/2017   Procedure: NEPHROLITHOTOMY PERCUTANEOUS WITH SURGEON ACCESS;  Surgeon: Cleon Gustin, MD;  [facility];  Service: Urology;  Laterality: Right;   NEPHROLITHOTOMY Left 10/24/2017   Procedure: NEPHROLITHOTOMY PERCUTANEOUS;  Surgeon: Cleon Gustin, MD;  [facility];  Service: Urology;  Laterality: Left;   NEPHROLITHOTOMY Left 03/26/2019   Procedure: NEPHROLITHOTOMY PERCUTANEOUS;  Surgeon: Cleon Gustin, MD;  [facility];  Service: Urology;  Laterality: Left;  2 HRS   NEPHROLITHOTOMY Left 04/17/2019   Procedure: NEPHROLITHOTOMY PERCUTANEOUS;  Surgeon: Cleon Gustin, MD;  [facility];  Service: Urology;  Laterality: Left;  2 HRS   ORIF TIBIA FRACTURE Right 02/06/2013   Procedure: OPEN REDUCTION INTERNAL FIXATION (ORIF) TIBIA FRACTURE WITH IM ROD FIBULA;  Surgeon: Rozanna Box, MD;  Location: [place];  Service: Orthopedics;  Laterality: Right;   ORIF TIBIA FRACTURE Right 07/03/2013  Procedure: RIGHT TIBIA NON UNION REPAIR ;  Surgeon: Rozanna Box, MD;  Location: [place];  Service: Orthopedics;  Laterality: Right;   ORIF WRIST FRACTURE  10/02/2012   Procedure: OPEN REDUCTION INTERNAL FIXATION (ORIF) WRIST FRACTURE;  Surgeon: Roseanne Kaufman, MD;  [facility];  Service: Orthopedics;  Laterality: Right;  WITH   ANTIBIOTIC  CEMENT   ORIF WRIST FRACTURE Left 10/28/2013   Procedure: OPEN REDUCTION INTERNAL FIXATION (ORIF) WRIST FRACTURE with allograft;  Surgeon: Roseanne Kaufman, MD;  [facility];  Service: Orthopedics;  Laterality: Left;  DVR Plate   QUADRICEPS TENDON REPAIR Left 07/15/2017   Procedure: REPAIR QUADRICEP TENDON;  Surgeon: Frederik Pear, MD;  Location: [place];  Service: Orthopedics;  Laterality:  Left;   RIGHT/LEFT HEART CATH AND CORONARY ANGIOGRAPHY N/A 08/13/2019   Procedure: RIGHT/LEFT HEART CATH AND CORONARY ANGIOGRAPHY;  Surgeon: Nelva Bush, MD;  Location: [place] [facility];  Service: Cardiovascular;  Laterality: N/A;   THYROIDECTOMY  02/1986   TOTAL HIP ARTHROPLASTY Right 03-16-2016   @[facility]    TOTAL HIP REVISION  10/05/2012   Procedure: TOTAL HIP REVISION;  Surgeon: [name], MD;  [facility];  Service: Orthopedics;  Laterality: Right;  RIGHT TOTAL HIP REVISION   TOTAL HIP REVISION Right 09/17/2013   Procedure: REVISION RIGHT TOTAL HIP ARTHROPLASTY ;  Surgeon: [name], MD;  [facility];  Service: Orthopedics;  Laterality: Right;   TOTAL HIP REVISION Right 10/26/2013   Procedure: REVISION RIGHT TOTAL HIP ARTHROPLASTY;  Surgeon: [name], MD;  [facility];  Service: Orthopedics;  Laterality: Right;   TOTAL KNEE ARTHROPLASTY Bilateral right 03-15-2011;  left 06-30-2011   TOTAL KNEE REVISION Left 04/11/2017   Procedure: TOTAL KNEE REVISION PATELLA and TIBIA;  Surgeon: Frederik Pear, MD;  Location: [place];  Service: Orthopedics;  Laterality: Left;    SOCIAL HISTORY: Social History   Socioeconomic History   Marital status: Married    Spouse name: Not on file   Number of children: Not on file   Years of education: Not on file   Highest education level: Not on file  Occupational History   Not on file  Tobacco Use   Smoking status: Former    Packs/day: 1.00    Years: 50.00    Pack years: 50.00    Types: Cigarettes    Quit date: 01/12/2012    Years since quitting: 9.9   Smokeless tobacco: Never  Vaping Use   Vaping Use: Never used  Substance and Sexual Activity   Alcohol use: Not Currently    Comment: occasional-social   Drug use: Never   Sexual activity: Yes  Other Topics Concern   Not on file  Social History Narrative   [place] 2.5 months, went home Feb 21st slipped and fell on back and developed.  Home PT/OT.  Just started outpatient  physical therapy.  Friday night, misstepped.     Social Determinants of Health   Financial Resource Strain: Not on file  Food Insecurity: Not on file  Transportation Needs: Not on file  Physical Activity: Not on file  Stress: Not on file  Social Connections: Not on file  Intimate Partner Violence: Not on file    FAMILY HISTORY: Family History  Problem Relation Age of Onset   CAD Father 80   Asthma Father    Alcohol abuse Father    Arthritis Mother    Alcohol abuse Sister     ALLERGIES:  is allergic to short  ragweed pollen ext.  MEDICATIONS:  Current Outpatient Medications  Medication Sig Dispense Refill   acetaminophen (TYLENOL) 500 MG tablet Take 1,000 mg by mouth daily as needed for moderate pain.     albuterol (VENTOLIN HFA) 108 (90 Base) MCG/ACT inhaler Inhale 2 puffs into the lungs every 6 (six) hours as needed for wheezing or shortness of breath.     alfuzosin (UROXATRAL) 10 MG 24 hr tablet TAKE 1 TABLET AT BEDTIME 90 tablet 3   amoxicillin (AMOXIL) 500 MG capsule Take 2,000 mg by mouth See admin instructions. Take 4 capsules (2000 mg) by mouth 1 hour prior to dental work (Patient not taking: Reported on 10/28/2021)     aspirin EC 81 MG tablet Take 81 mg by mouth daily.     atorvastatin (LIPITOR) 80 MG tablet Take 1 tablet (80 mg total) by mouth daily. 90 tablet 3   benzonatate (TESSALON) 100 MG capsule Take 1 capsule (100 mg total) by mouth every 8 (eight) hours. 21 capsule 0   buPROPion (WELLBUTRIN XL) 300 MG 24 hr tablet TAKE 1 TABLET EVERY DAY WITH BREAKFAST 90 tablet 3   Calcium Citrate-Vitamin D (CALCIUM CITRATE + D PO) Take 1 tablet by mouth 2 (two) times daily. 1200mg  of Calcium and 1000units vitamin D3     Cholecalciferol (VITAMIN D-3 PO) Take 2,000 Units by mouth 2 (two) times daily.      clonazePAM (KLONOPIN) 1 MG tablet Take 1 tablet (1 mg total) by mouth every 6 (six) hours as needed. for anxiety 120 tablet 0   denosumab (PROLIA) 60 MG/ML SOSY injection Inject  60 mg as directed every 6 (six) months. Last injection: Feb 2020 Next injection: August 2020     diltiazem (CARDIZEM CD) 240 MG 24 hr capsule TAKE 1 CAPSULE EVERY DAY 90 capsule 3   DULoxetine (CYMBALTA) 60 MG capsule TAKE 1 CAPSULE TWICE DAILY 180 capsule 3   EPINEPHrine 0.3 mg/0.3 mL IJ SOAJ injection See admin instructions.     ezetimibe (ZETIA) 10 MG tablet TAKE 1 TABLET EVERY DAY 90 tablet 2   ferrous sulfate 325 (65 FE) MG tablet Take 325 mg by mouth 2 (two) times daily with a meal.      hydrochlorothiazide (HYDRODIURIL) 12.5 MG tablet TAKE 1 TABLET (12.5 MG TOTAL) BY MOUTH DAILY. 90 tablet 3   HYDROcodone-acetaminophen (NORCO) 7.5-325 MG tablet Take 1 tablet by mouth every 6 (six) hours as needed for severe pain. 120 tablet 0   isosorbide mononitrate (IMDUR) 30 MG 24 hr tablet TAKE 1 TABLET (30 MG TOTAL) BY MOUTH DAILY. 90 tablet 2   levothyroxine (SYNTHROID) 200 MCG tablet TAKE 1 TABLET EVERY DAY AT 6AM 90 tablet 3   loratadine (CLARITIN) 10 MG tablet Take 10 mg by mouth daily.      loratadine (CLARITIN) 10 MG tablet 1 tablet     losartan (COZAAR) 50 MG tablet TAKE 1 TABLET EVERY DAY 90 tablet 2   Magnesium 500 MG CAPS Take 1 capsule (500 mg total) by mouth daily. (Patient taking differently: Take 500 mg by mouth every evening.) 30 capsule 1   metoprolol tartrate (LOPRESSOR) 25 MG tablet Take 1 tablet (25 mg total) by mouth 2 (two) times daily. 180 tablet 3   Multiple Vitamin (MULTIVITAMIN WITH MINERALS) TABS tablet Take 1 tablet by mouth daily. Men's One-A-Day 50+     MYRBETRIQ 25 MG TB24 tablet TAKE 1 TABLET EVERY DAY 90 tablet 3   nitroGLYCERIN (NITROSTAT) 0.4 MG SL tablet Place 0.4 mg  under the tongue every 5 (five) minutes as needed for chest pain. x3 doses as needed for chest pain     omeprazole (PRILOSEC) 40 MG capsule TAKE 1 CAPSULE EVERY DAY BEFORE BREAKFAST 90 capsule 3   Potassium Citrate 15 MEQ (1620 MG) TBCR TAKE 1 TABLET TWICE DAILY 180 tablet 3   Probiotic Product  (PROBIOTIC PO) Take 1 capsule by mouth daily with breakfast.     Risankizumab-rzaa (SKYRIZI PEN [place]) Inject 150 mg into the skin every 3 (three) months.     rivaroxaban (XARELTO) 10 MG TABS tablet Take 1 tablet (10 mg total) by mouth daily. 90 tablet 3   SKYRIZI PEN 150 MG/ML SOAJ      solifenacin (VESICARE) 5 MG tablet      tacrolimus (PROTOPIC) 0.1 % ointment See admin instructions.     testosterone enanthate (DELATESTRYL) 200 MG/ML injection INJECT 1ML (200MG ) INTO THE MUSCLE EVERY 14 DAYS. DISCARD VIAL AFTER 28 DAYS. 5 mL 3   tiZANidine (ZANAFLEX) 4 MG tablet TAKE 1 TABLET EVERY 6 HOURS AS NEEDED FOR MUSCLE SPASM(S) 360 tablet 1   vitamin C (ASCORBIC ACID) 500 MG tablet Take 500 mg by mouth daily.     No current facility-administered medications for this visit.    REVIEW OF SYSTEMS:   .10 Point review of Systems was done is negative except as noted above.   PHYSICAL EXAMINATION: SKIN: chronic venous stasis changes in BLE, psoriasis rashes on elbows have improved with Otezla   ECOG FS:3 - Symptomatic, >50% confined to bed  Vitals:   12/08/21 1355  BP: 118/60  Pulse: 76  Resp: 18  Temp: 98.2 F (36.8 C)  SpO2: 99%   Wt Readings from Last 3 Encounters:  12/08/21 285 lb 14.4 oz (129.7 kg)  10/28/21 287 lb 8 oz (130.4 kg)  10/13/21 287 lb 0.6 oz (130.2 kg)    Body mass index is 35.74 kg/m.    GENERAL:alert, in no acute distress and comfortable SKIN: no acute rashes, no significant lesions EYES: conjunctiva are pink and non-injected, sclera anicteric NECK: supple, no JVD LYMPH:  no palpable lymphadenopathy in the cervical, axillary or inguinal regions LUNGS: clear to auscultation b/l with normal respiratory effort HEART: regular rate & rhythm ABDOMEN:  normoactive bowel sounds , non tender, not distended. Extremity: no pedal edema PSYCH: alert & oriented x 3 with fluent speech NEURO: no focal motor/sensory deficits  LABORATORY DATA:  I have reviewed the data as  listed  CBC Latest Ref Rng & Units 12/08/2021 06/08/2021 05/19/2021  WBC 4.0 - 10.5 K/uL 6.1 6.5 8.8  Hemoglobin 13.0 - 17.0 g/dL 16.9 17.4(H) 14.8  Hematocrit 39.0 - 52.0 % 51.0 52.3(H) 44.7  Platelets 150 - 400 K/uL 139(L) 130(L) 134(L)    CMP Latest Ref Rng & Units 10/28/2021 06/08/2021 05/19/2021  Glucose 70 - 99 mg/dL 95 141(H) 99  BUN 8 - 27 mg/dL 30(H) 26(H) 33(H)  Creatinine 0.76 - 1.27 mg/dL 1.51(H) 1.38(H) 1.44(H)  Sodium 134 - 144 mmol/L 138 140 137  Potassium 3.5 - 5.2 mmol/L 4.8 4.7 4.1  Chloride 96 - 106 mmol/L 97 104 99  CO2 20 - 29 mmol/L 26 29 28   Calcium 8.6 - 10.2 mg/dL 9.8 9.3 9.0  Total Protein 6.0 - 8.5 g/dL 6.4 6.5 -  Total Bilirubin 0.0 - 1.2 mg/dL 0.8 1.2 -  Alkaline Phos 44 - 121 IU/L 65 61 -  AST 0 - 40 IU/L 34 27 -  ALT 0 - 44 IU/L 37  42 -   . Lab Results  Component Value Date   LDH 223 (H) 06/08/2021   06/28/2019 ECHOCARDIOGRAM COMPLETE (Accession [id number])   05/29/18 Left tonsil biopsy:   RADIOGRAPHIC STUDIES: I have personally reviewed the radiological images as listed and agreed with the findings in the report. No results found.  ASSESSMENT & PLAN:  76 y.o. male with  1. Diffuse Large B-Cell Lymphoma,- Stage III Left tonsil -05/29/18 Left tonsil biopsy which revealed Diffuse Large B-Cell Lymphoma, which requires treatment  -06/07/18 LDH at 214, will monitor change through treatment.  -Baseline ECHO 06/14/18: EF 50-55% -Standard regimen of R-CHOP with G-CSF support for 6 cycles starting 06/23/18  06/16/18 PET/CT revealed 1. Deauville 5 hypermetabolic lesions in the palatine tonsils, left internal jugular chain, and involving a right inguinal lymph node. Deauville 4 involvement of a left inguinal lymph node and an enlarged subcutaneous lesion favoring lymph node superficial to the lower right sternocleidomastoid muscle in the neck. 2. Other imaging findings of potential clinical significance: Aortic Atherosclerosis. Coronary atherosclerosis. Emphysema.  Bilateral nonobstructive nephrolithiasis. Umbilical hernia contains adipose tissues. Old right rib fractures some of which are nonunited. 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   08/22/18 PET/CT revealed Interval response to therapy. Overall Deauville criteria 3. Interval decrease in size and FDG uptake associated with palatine tonsil lesions. There has been resolution of previous left level-II and bilateral inguinal lymph nodes. 2. Enlarged subcutaneous lesion superficial to the lower right sternocleidomastoid muscle exhibits decreased FDG uptake. Currently Deauville criteria 2. 3. No new or progressive sites of disease identified. 4.  Aortic Atherosclerosis. 5. Multi vessel coronary artery atherosclerotic calcifications. 6. Unchanged vertebra plana deformity. 7. Kidney stones.   S/p 6 cycles of R-CHOP completed on 10/12/18  11/22/18 PET/CT revealed The tonsillar mass has essentially resolved. The large presumed lymph node superficial to the right sternocleidomastoid is stable in size at 2.3 cm in short axis, with maximum SUV of 3.3 (formerly 2.4), representing Deauville 3 activity, previously Deauville 2. 2. Other imaging findings of potential clinical significance: Aortic Atherosclerosis and Emphysema. Coronary atherosclerosis. Bilateral nonobstructive nephrolithiasis. Umbilical hernia contains adipose tissue. Chronic high activity posterior to the right hip implant along a region of bony irregularity and deformity probably reflecting chronic inflammation. L1 vertebra plana.  2.  Patient Active Problem List   Diagnosis Date Noted   Pure hypercholesterolemia 02/10/2021   Chronic diastolic heart failure ([place]) 06/21/2019   H/O nephrolithotomy with removal of calculi 03/26/2019   Renal calculus 03/26/2019   Generalized weakness 11/08/2018   Well adult exam 10/02/2018   Counseling regarding advance care planning and goals of care 07/09/2018   Diffuse large B-cell lymphoma ([place]) 06/23/2018   Port-A-Cath in place  06/23/2018   Hypogonadism in male 11/22/2017   Nephrolithiasis 10/24/2017   Recurrent dislocation of left patella 07/15/2017   Recurrent subluxation of patella, left 07/08/2017   S/P revision of total knee 04/11/2017   Failed total knee, left, initial encounter ([place]) 04/09/2017   Renal calculus, right 02/08/2017   Protein-calorie malnutrition, severe ([place]) 12/08/2016   Chronic obstructive pulmonary disease ([place]) 12/08/2016   Staghorn renal calculus 11/05/2016   Wound drainage right hip 11/15/2013   Expected blood loss anemia 09/18/2013   Hyponatremia 09/18/2013   S/P right TH revision 09/17/2013   Nonunion, fracture, Right tibia  07/03/2013   UTI (urinary tract infection) 07/03/2013   Pathological fracture due to osteoporosis with delayed healing 04/24/2013   Osteoporosis with fracture 04/24/2013   Rash and nonspecific skin eruption 03/08/2013  Syncope 02/21/2013   Chronic anticoagulation 02/21/2013   Fall 02/08/2013   Physical deconditioning 02/08/2013   Low back pain radiating to both legs 02/08/2013   Compression fracture of L1 lumbar vertebra ([place]) 02/08/2013   Right tibial fracture 02/05/2013   Morbid obesity ([place]) 02/05/2013   Osteomyelitis of right wrist ([place]) 11/02/2012   Anemia 10/06/2012   Essential hypertension 10/03/2012   Psoriasis 10/03/2012   Vitamin D deficiency 10/03/2012   GERD (gastroesophageal reflux disease) 10/03/2012   Hyperglycemia 10/03/2012   Anxiety 10/03/2012   Depression 10/03/2012   CAD (coronary artery disease) 10/03/2012   DJD (degenerative joint disease) 10/03/2012   Chronic gingivitis 10/03/2012   Low testosterone, possible hypogonadism 10/01/2012   Normocytic anemia 09/30/2012   Right femoral fracture ([place]) 09/29/2012   Right forearm fracture 09/29/2012   Longstanding persistent atrial fibrillation ([place]) 09/29/2012   Hypothyroidism 09/29/2012   History of recurrent UTIs 09/29/2012    PLAN:  -Discussed lab results in detail with the  patient.  CBC within normal limits except platelet count of 139k Chemistries stable and unremarkable - -LDH within normal limits at 139 - has no lab or clinical evidence of recurrence/progression of his large B-cell lymphoma at this time  FOLLOW UP: RTC with Dr Irene Limbo with labs in 6 months

## 2021-12-09 ENCOUNTER — Telehealth: Payer: Self-pay | Admitting: Hematology

## 2021-12-09 DIAGNOSIS — N401 Enlarged prostate with lower urinary tract symptoms: Secondary | ICD-10-CM | POA: Diagnosis not present

## 2021-12-09 DIAGNOSIS — R351 Nocturia: Secondary | ICD-10-CM | POA: Diagnosis not present

## 2021-12-09 DIAGNOSIS — R972 Elevated prostate specific antigen [PSA]: Secondary | ICD-10-CM | POA: Diagnosis not present

## 2021-12-09 NOTE — Telephone Encounter (Signed)
Left message with follow-up appointment per 2/28 los. ?

## 2021-12-14 ENCOUNTER — Encounter: Payer: Self-pay | Admitting: Hematology

## 2021-12-23 DIAGNOSIS — J301 Allergic rhinitis due to pollen: Secondary | ICD-10-CM | POA: Diagnosis not present

## 2021-12-23 DIAGNOSIS — F410 Panic disorder [episodic paroxysmal anxiety] without agoraphobia: Secondary | ICD-10-CM | POA: Diagnosis not present

## 2021-12-23 DIAGNOSIS — F331 Major depressive disorder, recurrent, moderate: Secondary | ICD-10-CM | POA: Diagnosis not present

## 2021-12-23 DIAGNOSIS — J3089 Other allergic rhinitis: Secondary | ICD-10-CM | POA: Diagnosis not present

## 2022-01-06 ENCOUNTER — Emergency Department (HOSPITAL_BASED_OUTPATIENT_CLINIC_OR_DEPARTMENT_OTHER)
Admission: EM | Admit: 2022-01-06 | Discharge: 2022-01-06 | Disposition: A | Discharge status: Home / Self Care | Payer: Medicare PPO | Attending: Emergency Medicine | Admitting: Emergency Medicine

## 2022-01-06 ENCOUNTER — Encounter (HOSPITAL_BASED_OUTPATIENT_CLINIC_OR_DEPARTMENT_OTHER): Payer: Self-pay

## 2022-01-06 ENCOUNTER — Other Ambulatory Visit: Payer: Self-pay

## 2022-01-06 DIAGNOSIS — Z7982 Long term (current) use of aspirin: Secondary | ICD-10-CM | POA: Insufficient documentation

## 2022-01-06 DIAGNOSIS — I4891 Unspecified atrial fibrillation: Secondary | ICD-10-CM | POA: Insufficient documentation

## 2022-01-06 DIAGNOSIS — Z79899 Other long term (current) drug therapy: Secondary | ICD-10-CM | POA: Diagnosis not present

## 2022-01-06 DIAGNOSIS — I110 Hypertensive heart disease with heart failure: Secondary | ICD-10-CM | POA: Insufficient documentation

## 2022-01-06 DIAGNOSIS — I2510 Atherosclerotic heart disease of native coronary artery without angina pectoris: Secondary | ICD-10-CM | POA: Diagnosis not present

## 2022-01-06 DIAGNOSIS — E039 Hypothyroidism, unspecified: Secondary | ICD-10-CM | POA: Diagnosis not present

## 2022-01-06 DIAGNOSIS — I5030 Unspecified diastolic (congestive) heart failure: Secondary | ICD-10-CM | POA: Diagnosis not present

## 2022-01-06 DIAGNOSIS — Z20822 Contact with and (suspected) exposure to covid-19: Secondary | ICD-10-CM | POA: Diagnosis not present

## 2022-01-06 DIAGNOSIS — L989 Disorder of the skin and subcutaneous tissue, unspecified: Secondary | ICD-10-CM | POA: Diagnosis not present

## 2022-01-06 DIAGNOSIS — Z7901 Long term (current) use of anticoagulants: Secondary | ICD-10-CM | POA: Insufficient documentation

## 2022-01-06 DIAGNOSIS — R059 Cough, unspecified: Secondary | ICD-10-CM | POA: Diagnosis not present

## 2022-01-06 DIAGNOSIS — M7989 Other specified soft tissue disorders: Secondary | ICD-10-CM | POA: Insufficient documentation

## 2022-01-06 DIAGNOSIS — R0981 Nasal congestion: Secondary | ICD-10-CM | POA: Insufficient documentation

## 2022-01-06 LAB — RESP PANEL BY RT-PCR (FLU A&B, COVID) ARPGX2
Influenza A by PCR: NEGATIVE
Influenza B by PCR: NEGATIVE
SARS Coronavirus 2 by RT PCR: NEGATIVE

## 2022-01-06 NOTE — ED Triage Notes (Signed)
Patient here POV from Home. ? ?Patient endorses approximately 1 week PTA he noticed a "Lump" to Left Hip. Since then the Swelling has grown, it has become purple and became painful today. Lump is Firm.  ? ?Also endorses Productive Cough approximately 2-3 days PTA.  ? ?No Fevers. No Congestion.  ? ?NAD Noted during Triage. A&Ox4. GCS 15. Ambulatory with Gilford Rile. ?

## 2022-01-06 NOTE — ED Provider Notes (Signed)
?[facility] ?Provider Note ? ? ?CSN: [id number] ?Arrival date & time: 01/06/22  1929 ? ?  ? ?History ? ?Chief Complaint  ?Patient presents with  ? Cough  ? Mass  ? ? ?[name] is a 76 y.o. male. ? ?HPI ? ?  ? ?76 year old male with a history of diffuse large B-cell lymphoma, CHF, coronary artery disease, atrial fibrillation on Xarelto, hypothyroidism, presents with concern for lesion over his left hip.  Reports approximately 3 weeks ago he noted an area over his left hip that was fairly swelling.  Today it became purple in color and painful.  He receives testosterone shots in an area nearby, but not directly over this area.  Denies any history of trauma.  Denies any fever, chills.  He otherwise has been feeling well with exception of some mild cough over the last couple of days that he feels may be secondary to allergies.  Also notes some nasal congestion.  No new or worsening shortness of breath, no chest pain today.  No nausea, vomiting.  Has never had a lesion like this before.  Next to the lesion that is painful, he notes new development of other areas of swelling nearby.  Pain is worse when laying on it and relieved if he lays on the other side, just a mild soreness unless he puts pressure on it. ? ?Past Medical History:  ?Diagnosis Date  ? Anticoagulant long-term use   ? xarelto--- takes for AFib,  managed by pcp, Dr Alain Marion  ? Benign localized prostatic hyperplasia with lower urinary tract symptoms (LUTS)   ? CAD (coronary artery disease) cardiologist--  dr t. Oval Linsey  ? hx of known CAD obstruction w/ collaterals (cath done @ [facility] 12-31-2011) ;   last cardiac cath 08-13-2019  showed sig. 2V CAD involving proxLAD and CTO of the RCA/  chronic total occlusion midRCA w/ bridging and L>R collaterals  ? Diastolic CHF ([place])   ? dx 06-15-2019 hospital admission (followed by pcp)  ? Diffuse large B cell lymphoma [facility]) oncologist-- dr Irene Limbo--- in remission  ? dx 08/ 2019 -- bx left  tonsill mass-- involving lymph nodes--- completed chemotherapy 10-12-2018  ? DJD (degenerative joint disease)   ? Dysthymic disorder   ? Elevated brain natriuretic peptide (BNP) level   ? Environmental allergies   ? GAD (generalized anxiety disorder)   ? GERD (gastroesophageal reflux disease)   ? History of falling   ? multiple times  ? History of Graves' disease   ? 1987 s/p total thyroidectomy  ? History of kidney stones   ? History of scabies   ? 07/ 2014  resolved  ? History of syncope   ? per D/C note in epic 02-21-2013 ?vasovagal  ? History of vertebral compression fracture   ? 05/ 2014 --  L1, no surgical intervention  ? HLD (hyperlipidemia)   ? Hyperlipidemia   ? Hypertension   ? Hypogonadism in male   ? IDA (iron deficiency anemia)   ? Lumbago   ? OA (osteoarthritis)   ? OSA on CPAP   ? cpap machine-settings 17  ? Osteoporosis   ? Severe  ? Persistent atrial fibrillation ([place])   ? cardiologist--- dr Alfonso Patten. Oval Linsey  ? Plaque psoriasis   ? dermatologist--- dr Harriett Sine--- currently taking otezla  ? Post-surgical hypothyroidism   ? followed by pcp---  s/p total thyroidectomy for graves disease in 1987  ? Pure hypercholesterolemia 02/10/2021  ? Renal calculus, right   ?  Thrombocytopenia ([place])   ? Unsteady gait   ? uses walker  ? Wears hearing aid in both ears   ?  ?Home Medications ?Prior to Admission medications   ?Medication Sig Start Date End Date Taking? Authorizing Provider  ?acetaminophen (TYLENOL) 500 MG tablet Take 1,000 mg by mouth daily as needed for moderate pain.    [provider]  ?albuterol (VENTOLIN HFA) 108 (90 Base) MCG/ACT inhaler Inhale 2 puffs into the lungs every 6 (six) hours as needed for wheezing or shortness of breath.    [provider]  ?alfuzosin (UROXATRAL) 10 MG 24 hr tablet TAKE 1 TABLET AT BEDTIME 03/19/21   McKenzie, Candee Furbish, MD  ?amoxicillin (AMOXIL) 500 MG capsule Take 2,000 mg by mouth See admin instructions. Take 4 capsules (2000 mg) by mouth 1 hour  prior to dental work ?Patient not taking: Reported on 10/28/2021    [provider]  ?aspirin EC 81 MG tablet Take 81 mg by mouth daily.    [provider]  ?atorvastatin (LIPITOR) 80 MG tablet Take 1 tablet (80 mg total) by mouth daily. 08/18/21 11/16/21  Skeet Latch, MD  ?benzonatate (TESSALON) 100 MG capsule Take 1 capsule (100 mg total) by mouth every 8 (eight) hours. 10/13/21   Redwine, Madison A, PA-C  ?buPROPion (WELLBUTRIN XL) 300 MG 24 hr tablet TAKE 1 TABLET EVERY DAY WITH BREAKFAST 02/20/21   Plotnikov, Evie Lacks, MD  ?Calcium Citrate-Vitamin D (CALCIUM CITRATE + D PO) Take 1 tablet by mouth 2 (two) times daily. '1200mg'$  of Calcium and 1000units vitamin D3    [provider]  ?Cholecalciferol (VITAMIN D-3 PO) Take 2,000 Units by mouth 2 (two) times daily.     [provider]  ?clonazePAM (KLONOPIN) 1 MG tablet Take 1 tablet (1 mg total) by mouth every 6 (six) hours as needed. for anxiety 08/28/21   Biagio Borg, MD  ?denosumab (PROLIA) 60 MG/ML SOSY injection Inject 60 mg as directed every 6 (six) months. Last injection: Feb 2020 Next injection: August 2020 12/10/15   [provider]  ?diltiazem (CARDIZEM CD) 240 MG 24 hr capsule TAKE 1 CAPSULE EVERY DAY 03/23/21   Plotnikov, Evie Lacks, MD  ?DULoxetine (CYMBALTA) 60 MG capsule TAKE 1 CAPSULE TWICE DAILY 03/23/21   Plotnikov, Evie Lacks, MD  ?EPINEPHrine 0.3 mg/0.3 mL IJ SOAJ injection See admin instructions. 07/31/21   [provider]  ?ezetimibe (ZETIA) 10 MG tablet TAKE 1 TABLET EVERY DAY 08/10/21   Skeet Latch, MD  ?ferrous sulfate 325 (65 FE) MG tablet Take 325 mg by mouth 2 (two) times daily with a meal.     [provider]  ?hydrochlorothiazide (HYDRODIURIL) 12.5 MG tablet TAKE 1 TABLET (12.5 MG TOTAL) BY MOUTH DAILY. 06/23/21   Loel Dubonnet, NP  ?HYDROcodone-acetaminophen (NORCO) 7.5-325 MG tablet Take 1 tablet by mouth every 6 (six) hours as needed for severe pain. 09/30/20    Plotnikov, Evie Lacks, MD  ?isosorbide mononitrate (IMDUR) 30 MG 24 hr tablet TAKE 1 TABLET (30 MG TOTAL) BY MOUTH DAILY. 08/10/21   Skeet Latch, MD  ?levothyroxine (SYNTHROID) 200 MCG tablet TAKE 1 TABLET EVERY DAY AT 6AM 03/23/21   Plotnikov, Evie Lacks, MD  ?loratadine (CLARITIN) 10 MG tablet Take 10 mg by mouth daily.     [provider]  ?losartan (COZAAR) 50 MG tablet TAKE 1 TABLET EVERY DAY 06/18/21   Skeet Latch, MD  ?Magnesium 500 MG CAPS Take 1 capsule (500 mg total) by mouth daily. ?Patient  taking differently: Take 500 mg by mouth every evening. 10/13/18   Brunetta Genera, MD  ?metoprolol tartrate (LOPRESSOR) 25 MG tablet Take 1 tablet (25 mg total) by mouth 2 (two) times daily. 08/18/21   Skeet Latch, MD  ?Multiple Vitamin (MULTIVITAMIN WITH MINERALS) TABS tablet Take 1 tablet by mouth daily. Men's One-A-Day 50+    [provider]  ?MYRBETRIQ 25 MG TB24 tablet TAKE 1 TABLET EVERY DAY 10/06/21   McKenzie, Candee Furbish, MD  ?nitroGLYCERIN (NITROSTAT) 0.4 MG SL tablet Place 0.4 mg under the tongue every 5 (five) minutes as needed for chest pain. x3 doses as needed for chest pain    [provider]  ?omeprazole (PRILOSEC) 40 MG capsule TAKE 1 CAPSULE EVERY DAY BEFORE BREAKFAST 06/18/21   Plotnikov, Evie Lacks, MD  ?Potassium Citrate 15 MEQ (1620 MG) TBCR TAKE 1 TABLET TWICE DAILY 03/19/21   McKenzie, Candee Furbish, MD  ?Probiotic Product (PROBIOTIC PO) Take 1 capsule by mouth daily with breakfast.    [provider]  ?Risankizumab-rzaa (SKYRIZI PEN [place]) Inject 150 mg into the skin every 3 (three) months.    [provider]  ?rivaroxaban (XARELTO) 10 MG TABS tablet Take 1 tablet (10 mg total) by mouth daily. 06/29/21   Plotnikov, Evie Lacks, MD  ?solifenacin (VESICARE) 5 MG tablet  08/24/21   [provider]  ?testosterone enanthate (DELATESTRYL) 200 MG/ML injection INJECT 1ML ('200MG'$ ) INTO THE MUSCLE EVERY 14 DAYS. DISCARD VIAL AFTER 28 DAYS. 09/22/21    Plotnikov, Evie Lacks, MD  ?tiZANidine (ZANAFLEX) 4 MG tablet TAKE 1 TABLET EVERY 6 HOURS AS NEEDED FOR MUSCLE SPASM(S) 06/29/21   Plotnikov, Evie Lacks, MD  ?vitamin C (ASCORBIC ACID) 500 MG tablet Take 500 mg by

## 2022-01-07 ENCOUNTER — Ambulatory Visit: Payer: Medicare PPO | Admitting: Internal Medicine

## 2022-01-07 ENCOUNTER — Ambulatory Visit (INDEPENDENT_AMBULATORY_CARE_PROVIDER_SITE_OTHER): Payer: Medicare PPO

## 2022-01-07 DIAGNOSIS — Z0000 Encounter for general adult medical examination without abnormal findings: Secondary | ICD-10-CM | POA: Diagnosis not present

## 2022-01-07 NOTE — Progress Notes (Signed)
?I connected with [name] today by telephone and verified that I am speaking with the correct person using two identifiers. ?Location patient: home ?Location provider: work ?Persons participating in the virtual visit: patient, [name] (spouse) and provider. ?  ?I discussed the limitations, risks, security and privacy concerns of performing an evaluation and management service by telephone and the availability of in person appointments. I also discussed with the patient that there may be a patient responsible charge related to this service. The patient expressed understanding and verbally consented to this telephonic visit.  ?  ?Interactive audio and video telecommunications were attempted between this provider and patient, however failed, due to patient having technical difficulties OR patient did not have access to video capability.  We continued and completed visit with audio only. ? ?Some vital signs may be absent or patient reported.  ? ?Time Spent with patient on telephone encounter: 30 minutes ? ?Subjective:  ? [name] is a 76 y.o. male who presents for Medicare Annual/Subsequent preventive examination. ? ?Review of Systems    ? ?Cardiac Risk Factors include: advanced age (>53mn, >>61women);dyslipidemia;hypertension;male gender;obesity (BMI >30kg/m2) ? ?   ?Objective:  ?  ?Today's Vitals  ? 01/07/22 1004  ?PainSc: 0-No pain  ? ?There is no height or weight on file to calculate BMI. ? ? ?  01/07/2022  ?  9:51 AM 01/06/2022  ?  7:53 PM 10/13/2021  ?  1:33 PM 05/19/2021  ?  8:44 AM 09/30/2020  ?  1:51 PM 09/09/2020  ?  9:35 AM 06/05/2020  ? 10:28 AM  ?Advanced Directives  ?Does Patient Have a Medical Advance Directive? Yes No No No Yes Yes Yes  ?Type of Advance Directive Living will;Healthcare Power of Attorney    Living will;[place]  ?Does patient want to make changes to medical advance directive? No  - Patient declined        ?Copy of [place] Chart? No - copy requested     Yes - validated most recent copy scanned in chart (See row information)   ?Would patient like information on creating a medical advance directive? No - Patient declined No - Patient declined No - Patient declined      ? ? ?Current Medications (verified) ?Outpatient Encounter Medications as of 01/07/2022  ?Medication Sig  ? acetaminophen (TYLENOL) 500 MG tablet Take 1,000 mg by mouth daily as needed for moderate pain.  ? albuterol (VENTOLIN HFA) 108 (90 Base) MCG/ACT inhaler Inhale 2 puffs into the lungs every 6 (six) hours as needed for wheezing or shortness of breath.  ? alfuzosin (UROXATRAL) 10 MG 24 hr tablet TAKE 1 TABLET AT BEDTIME  ? amoxicillin (AMOXIL) 500 MG capsule Take 2,000 mg by mouth See admin instructions. Take 4 capsules (2000 mg) by mouth 1 hour prior to dental work (Patient not taking: Reported on 10/28/2021)  ? aspirin EC 81 MG tablet Take 81 mg by mouth daily.  ? atorvastatin (LIPITOR) 80 MG tablet Take 1 tablet (80 mg total) by mouth daily.  ? benzonatate (TESSALON) 100 MG capsule Take 1 capsule (100 mg total) by mouth every 8 (eight) hours.  ? buPROPion (WELLBUTRIN XL) 300 MG 24 hr tablet TAKE 1 TABLET EVERY DAY WITH BREAKFAST  ? Calcium Citrate-Vitamin D (CALCIUM CITRATE + D PO) Take 1 tablet by mouth 2 (two) times daily. '1200mg'$  of Calcium and 1000units vitamin D3  ? Cholecalciferol (VITAMIN  D-3 PO) Take 2,000 Units by mouth 2 (two) times daily.   ? clonazePAM (KLONOPIN) 1 MG tablet Take 1 tablet (1 mg total) by mouth every 6 (six) hours as needed. for anxiety  ? denosumab (PROLIA) 60 MG/ML SOSY injection Inject 60 mg as directed every 6 (six) months. Last injection: Feb 2020 Next injection: August 2020  ? diltiazem (CARDIZEM CD) 240 MG 24 hr capsule TAKE 1 CAPSULE EVERY DAY  ? DULoxetine (CYMBALTA) 60 MG capsule TAKE 1 CAPSULE TWICE DAILY  ? EPINEPHrine 0.3 mg/0.3 mL IJ SOAJ injection See admin  instructions.  ? ezetimibe (ZETIA) 10 MG tablet TAKE 1 TABLET EVERY DAY  ? ferrous sulfate 325 (65 FE) MG tablet Take 325 mg by mouth 2 (two) times daily with a meal.   ? hydrochlorothiazide (HYDRODIURIL) 12.5 MG tablet TAKE 1 TABLET (12.5 MG TOTAL) BY MOUTH DAILY.  ? HYDROcodone-acetaminophen (NORCO) 7.5-325 MG tablet Take 1 tablet by mouth every 6 (six) hours as needed for severe pain.  ? isosorbide mononitrate (IMDUR) 30 MG 24 hr tablet TAKE 1 TABLET (30 MG TOTAL) BY MOUTH DAILY.  ? levothyroxine (SYNTHROID) 200 MCG tablet TAKE 1 TABLET EVERY DAY AT 6AM  ? loratadine (CLARITIN) 10 MG tablet Take 10 mg by mouth daily.   ? losartan (COZAAR) 50 MG tablet TAKE 1 TABLET EVERY DAY  ? Magnesium 500 MG CAPS Take 1 capsule (500 mg total) by mouth daily. (Patient taking differently: Take 500 mg by mouth every evening.)  ? metoprolol tartrate (LOPRESSOR) 25 MG tablet Take 1 tablet (25 mg total) by mouth 2 (two) times daily.  ? Multiple Vitamin (MULTIVITAMIN WITH MINERALS) TABS tablet Take 1 tablet by mouth daily. Men's One-A-Day 50+  ? MYRBETRIQ 25 MG TB24 tablet TAKE 1 TABLET EVERY DAY  ? nitroGLYCERIN (NITROSTAT) 0.4 MG SL tablet Place 0.4 mg under the tongue every 5 (five) minutes as needed for chest pain. x3 doses as needed for chest pain  ? omeprazole (PRILOSEC) 40 MG capsule TAKE 1 CAPSULE EVERY DAY BEFORE BREAKFAST  ? Potassium Citrate 15 MEQ (1620 MG) TBCR TAKE 1 TABLET TWICE DAILY  ? Probiotic Product (PROBIOTIC PO) Take 1 capsule by mouth daily with breakfast.  ? Risankizumab-rzaa (SKYRIZI PEN [place]) Inject 150 mg into the skin every 3 (three) months.  ? rivaroxaban (XARELTO) 10 MG TABS tablet Take 1 tablet (10 mg total) by mouth daily.  ? solifenacin (VESICARE) 5 MG tablet   ? testosterone enanthate (DELATESTRYL) 200 MG/ML injection INJECT 1ML ('200MG'$ ) INTO THE MUSCLE EVERY 14 DAYS. DISCARD VIAL AFTER 28 DAYS.  ? tiZANidine (ZANAFLEX) 4 MG tablet TAKE 1 TABLET EVERY 6 HOURS AS NEEDED FOR MUSCLE SPASM(S)  ? vitamin C  (ASCORBIC ACID) 500 MG tablet Take 500 mg by mouth daily.  ? [DISCONTINUED] loratadine (CLARITIN) 10 MG tablet 1 tablet  ? [DISCONTINUED] SKYRIZI PEN 150 MG/ML SOAJ   ? [DISCONTINUED] tacrolimus (PROTOPIC) 0.1 % ointment See admin instructions.  ? ?No facility-administered encounter medications on file as of 01/07/2022.  ? ? ?Allergies (verified) ?Short ragweed pollen ext  ? ?History: ?Past Medical History:  ?Diagnosis Date  ? Anticoagulant long-term use   ? xarelto--- takes for AFib,  managed by pcp, Dr Alain Marion  ? Benign localized prostatic hyperplasia with lower urinary tract symptoms (LUTS)   ? CAD (coronary artery disease) cardiologist--  dr t. Oval Linsey  ? hx of known CAD obstruction w/ collaterals (cath done @ [facility] 12-31-2011) ;   last cardiac cath 08-13-2019  showed sig. 2V  CAD involving proxLAD and CTO of the RCA/  chronic total occlusion midRCA w/ bridging and L>R collaterals  ? Diastolic CHF ([place])   ? dx 06-15-2019 hospital admission (followed by pcp)  ? Diffuse large B cell lymphoma [facility]) oncologist-- dr Irene Limbo--- in remission  ? dx 08/ 2019 -- bx left tonsill mass-- involving lymph nodes--- completed chemotherapy 10-12-2018  ? DJD (degenerative joint disease)   ? Dysthymic disorder   ? Elevated brain natriuretic peptide (BNP) level   ? Environmental allergies   ? GAD (generalized anxiety disorder)   ? GERD (gastroesophageal reflux disease)   ? History of falling   ? multiple times  ? History of Graves' disease   ? 1987 s/p total thyroidectomy  ? History of kidney stones   ? History of scabies   ? 07/ 2014  resolved  ? History of syncope   ? per D/C note in epic 02-21-2013 ?vasovagal  ? History of vertebral compression fracture   ? 05/ 2014 --  L1, no surgical intervention  ? HLD (hyperlipidemia)   ? Hyperlipidemia   ? Hypertension   ? Hypogonadism in male   ? IDA (iron deficiency anemia)   ? Lumbago   ? OA (osteoarthritis)   ? OSA on CPAP   ? cpap machine-settings 17  ? Osteoporosis   ? Severe  ?  Persistent atrial fibrillation ([place])   ? cardiologist--- dr Alfonso Patten. Oval Linsey  ? Plaque psoriasis   ? dermatologist--- dr Harriett Sine--- currently taking otezla  ? Post-surgical hypothyroidism   ? followed by pcp

## 2022-01-07 NOTE — Patient Instructions (Addendum)
[name] , ?Thank you for taking time to come for your Medicare Wellness Visit. I appreciate your ongoing commitment to your health goals. Please review the following plan we discussed and let me know if I can assist you in the future.  ? ?Screening recommendations/referrals: ?Colonoscopy: Not a candidate due to age ?Recommended yearly ophthalmology/optometry visit for glaucoma screening and checkup ?Recommended yearly dental visit for hygiene and checkup ? ?Vaccinations: ?Influenza vaccine: 07/08/2021 ?Pneumococcal vaccine: 12/03/2015, 08/16/2017 ?Tdap vaccine: 10/11/2012; due every 10 years ?Shingles vaccine: never done   ?Covid-19: 12/04/2019, 12/25/2019, 06/05/2020, 01/23/2021, 08/06/2021 ? ?Advanced directives: Please bring a copy of your health care power of attorney and living will to the office at your convenience. ? ?Conditions/risks identified: Yes ? ?Next appointment: Please schedule your next Medicare Wellness Visit with your Nurse Health Advisor in 1 year or 366 days by calling [phone]. ? ?Preventive Care 45 Years and Older, Male ?Preventive care refers to lifestyle choices and visits with your health care provider that can promote health and wellness. ?What does preventive care include? ?A yearly physical exam. This is also called an annual well check. ?Dental exams once or twice a year. ?Routine eye exams. Ask your health care provider how often you should have your eyes checked. ?Personal lifestyle choices, including: ?Daily care of your teeth and gums. ?Regular physical activity. ?Eating a healthy diet. ?Avoiding tobacco and drug use. ?Limiting alcohol use. ?Practicing safe sex. ?Taking low doses of aspirin every day. ?Taking vitamin and mineral supplements as recommended by your health care provider. ?What happens during an annual well check? ?The services and screenings done by your health care provider during your annual well check will depend on your age, overall health, lifestyle risk factors,  and family history of disease. ?Counseling  ?Your health care provider may ask you questions about your: ?Alcohol use. ?Tobacco use. ?Drug use. ?Emotional well-being. ?Home and relationship well-being. ?Sexual activity. ?Eating habits. ?History of falls. ?Memory and ability to understand (cognition). ?Work and work Statistician. ?Screening  ?You may have the following tests or measurements: ?Height, weight, and BMI. ?Blood pressure. ?Lipid and cholesterol levels. These may be checked every 5 years, or more frequently if you are over 17 years old. ?Skin check. ?Lung cancer screening. You may have this screening every year starting at age 43 if you have a 30-pack-year history of smoking and currently smoke or have quit within the past 15 years. ?Fecal occult blood test (FOBT) of the stool. You may have this test every year starting at age 87. ?Flexible sigmoidoscopy or colonoscopy. You may have a sigmoidoscopy every 5 years or a colonoscopy every 10 years starting at age 31. ?Prostate cancer screening. Recommendations will vary depending on your family history and other risks. ?Hepatitis C blood test. ?Hepatitis B blood test. ?Sexually transmitted disease (STD) testing. ?Diabetes screening. This is done by checking your blood sugar (glucose) after you have not eaten for a while (fasting). You may have this done every 1-3 years. ?Abdominal aortic aneurysm (AAA) screening. You may need this if you are a current or former smoker. ?Osteoporosis. You may be screened starting at age 4 if you are at high risk. ?Talk with your health care provider about your test results, treatment options, and if necessary, the need for more tests. ?Vaccines  ?Your health care provider may recommend certain vaccines, such as: ?Influenza vaccine. This is recommended every year. ?Tetanus, diphtheria, and acellular pertussis (Tdap, Td) vaccine. You may need a Td booster every 10 years. ?  Zoster vaccine. You may need this after age  26. ?Pneumococcal 13-valent conjugate (PCV13) vaccine. One dose is recommended after age 1. ?Pneumococcal polysaccharide (PPSV23) vaccine. One dose is recommended after age 38. ?Talk to your health care provider about which screenings and vaccines you need and how often you need them. ?This information is not intended to replace advice given to you by your health care provider. Make sure you discuss any questions you have with your health care provider. ?Document Released: 10/24/2015 Document Revised: 06/16/2016 Document Reviewed: 07/29/2015 ?Elsevier Interactive Patient Education ? 2017 [place]. ? ?Fall Prevention in the Home ?Falls can cause injuries. They can happen to people of all ages. There are many things you can do to make your home safe and to help prevent falls. ?What can I do on the outside of my home? ?Regularly fix the edges of walkways and driveways and fix any cracks. ?Remove anything that might make you trip as you walk through a door, such as a raised step or threshold. ?Trim any bushes or trees on the path to your home. ?Use bright outdoor lighting. ?Clear any walking paths of anything that might make someone trip, such as rocks or tools. ?Regularly check to see if handrails are loose or broken. Make sure that both sides of any steps have handrails. ?Any raised decks and porches should have guardrails on the edges. ?Have any leaves, snow, or ice cleared regularly. ?Use sand or salt on walking paths during winter. ?Clean up any spills in your garage right away. This includes oil or grease spills. ?What can I do in the bathroom? ?Use night lights. ?Install grab bars by the toilet and in the tub and shower. Do not use towel bars as grab bars. ?Use non-skid mats or decals in the tub or shower. ?If you need to sit down in the shower, use a plastic, non-slip stool. ?Keep the floor dry. Clean up any water that spills on the floor as soon as it happens. ?Remove soap buildup in the tub or shower  regularly. ?Attach bath mats securely with double-sided non-slip rug tape. ?Do not have throw rugs and other things on the floor that can make you trip. ?What can I do in the bedroom? ?Use night lights. ?Make sure that you have a light by your bed that is easy to reach. ?Do not use any sheets or blankets that are too big for your bed. They should not hang down onto the floor. ?Have a firm chair that has side arms. You can use this for support while you get dressed. ?Do not have throw rugs and other things on the floor that can make you trip. ?What can I do in the kitchen? ?Clean up any spills right away. ?Avoid walking on wet floors. ?Keep items that you use a lot in easy-to-reach places. ?If you need to reach something above you, use a strong step stool that has a grab bar. ?Keep electrical cords out of the way. ?Do not use floor polish or wax that makes floors slippery. If you must use wax, use non-skid floor wax. ?Do not have throw rugs and other things on the floor that can make you trip. ?What can I do with my stairs? ?Do not leave any items on the stairs. ?Make sure that there are handrails on both sides of the stairs and use them. Fix handrails that are broken or loose. Make sure that handrails are as long as the stairways. ?Check any carpeting to make sure that  it is firmly attached to the stairs. Fix any carpet that is loose or worn. ?Avoid having throw rugs at the top or bottom of the stairs. If you do have throw rugs, attach them to the floor with carpet tape. ?Make sure that you have a light switch at the top of the stairs and the bottom of the stairs. If you do not have them, ask someone to add them for you. ?What else can I do to help prevent falls? ?Wear shoes that: ?Do not have high heels. ?Have rubber bottoms. ?Are comfortable and fit you well. ?Are closed at the toe. Do not wear sandals. ?If you use a stepladder: ?Make sure that it is fully opened. Do not climb a closed stepladder. ?Make sure that  both sides of the stepladder are locked into place. ?Ask someone to hold it for you, if possible. ?Clearly mark and make sure that you can see: ?Any grab bars or handrails. ?First and last steps. ?Where the edge of each

## 2022-01-12 ENCOUNTER — Telehealth: Payer: Self-pay

## 2022-01-12 NOTE — Telephone Encounter (Signed)
Contacted pt/pt's spouse per Dr Caesar Bookman pt know ?If these are skin lesions he needs to see the dermatologist first for possible biopsy.  ?He could also f/u with PCP for further evaluation. If it looks like lymphoma then we can see him sooner.  ? ?Pt has an appointment with Dermatologist and has requested one with PCP. Will call us back with update ?Pt's spouse verbalized understanding of above information.  ?

## 2022-01-13 ENCOUNTER — Ambulatory Visit (INDEPENDENT_AMBULATORY_CARE_PROVIDER_SITE_OTHER): Payer: Medicare PPO

## 2022-01-13 DIAGNOSIS — I10 Essential (primary) hypertension: Secondary | ICD-10-CM

## 2022-01-13 DIAGNOSIS — I5032 Chronic diastolic (congestive) heart failure: Secondary | ICD-10-CM

## 2022-01-13 DIAGNOSIS — R7989 Other specified abnormal findings of blood chemistry: Secondary | ICD-10-CM

## 2022-01-13 DIAGNOSIS — I2510 Atherosclerotic heart disease of native coronary artery without angina pectoris: Secondary | ICD-10-CM

## 2022-01-13 DIAGNOSIS — I480 Paroxysmal atrial fibrillation: Secondary | ICD-10-CM

## 2022-01-13 NOTE — Progress Notes (Signed)
? ?Chronic Care Management ?Pharmacy Note ? ?01/13/2022 ?Name:  [name] MRN:  [id number] DOB:  [birth year] ? ?Summary: ?-Spoke with patient and spouse, reports that patient is stable, reports no issues or concerns with current medications  ?-Notes to compliance to medications, patient's spouse [name] helps patient organize medications and ensure he does not miss any doses of medication  ? ?Recommendations/Changes made from today's visit: ?-Recommending no changes to current medications  patient will reach out to office with any medication questions  ?-follow up with PCP in 1 week as planned  ? ?Subjective: ?[name] is an 76 y.o. year old male who is a primary patient of Plotnikov, Evie Lacks, MD.  The CCM team was consulted for assistance with disease management and care coordination needs.   ? ?Engaged with patient by telephone for follow up visit in response to provider referral for pharmacy case management and/or care coordination services.  ? ?Consent to Services:  ?The patient was given the following information about Chronic Care Management services today, agreed to services, and gave verbal consent: 1. CCM service includes personalized support from designated clinical staff supervised by the primary care provider, including individualized plan of care and coordination with other care providers 2. 24/7 contact phone numbers for assistance for urgent and routine care needs. 3. Service will only be billed when office clinical staff spend 20 minutes or more in a month to coordinate care. 4. Only one practitioner may furnish and bill the service in a calendar month. 5.The patient may stop CCM services at any time (effective at the end of the month) by phone call to the office staff. 6. The patient will be responsible for cost sharing (co-pay) of up to 20% of the service fee (after annual deductible is met). Patient agreed to services and consent obtained. ? ?Patient Care Team: ?Plotnikov, Evie Lacks,  MD as PCP - General (Internal Medicine) ?Skeet Latch, MD as PCP - Cardiology (Cardiology) ?Brunetta Genera, MD as Consulting Physician (Hematology) ?McKenzie, Candee Furbish, MD as Consulting Physician (Urology) ?Jackquline Denmark, MD as Consulting Physician (Gastroenterology) ?Skeet Latch, MD as Attending Physician (Cardiology) ?Lynnell Dike, OD as Consulting Physician (Optometry) ?[name], [facility] as Pharmacist (Pharmacist) ? ?Recent office visits:  ?10/07/2021 - Dr. Alain Marion - no changes to medications - f/u in 3 months  ?  ?Recent consult visits:  ?12/08/2021 - Dr. Irene Limbo - Oncology - f/u for diffuse Large B-Cell Lymphoma - no lab or clinical evidence of recurrent / progression of large B cell lymphoma - f/u in 6 months ?12/02/2021 - Emelda Brothers NP - Ortho -prolia injection  ?10/28/2021 - Dr. Oval Linsey - Cardiology - successfully completed PREP program - lost 14lbs and 4 inches off waist - f/u in 6 months  ?  ?Hospital visits:  ?01/06/2022 - ED visit  - lesion over left hip - recommend follow up with oncologist and dermatologist  ?10/13/2021 - ED visit - generalized body aches / PNA - rx'd benzonatate and doxycycline  ? ?Objective: ? ?Lab Results  ?Component Value Date  ? CREATININE 1.35 (H) 12/08/2021  ? BUN 25 (H) 12/08/2021  ? GFR 69.01 10/02/2018  ? GFRNONAA 54 (L) 12/08/2021  ? GFRAA 55 (L) 06/05/2020  ? NA 136 12/08/2021  ? K 4.6 12/08/2021  ? CALCIUM 9.6 12/08/2021  ? CO2 30 12/08/2021  ? GLUCOSE 99 12/08/2021  ? ? ?Lab Results  ?Component Value Date/Time  ? HGBA1C 5.8 (H) 02/06/2013 05:46 AM  ? HGBA1C (H) 03/15/2011  06:06 AM  ?  6.4 ?(NOTE)                                                                       According to the ADA Clinical Practice Recommendations for 2011, when HbA1c is used as a screening test:   >=6.5%   Diagnostic of Diabetes Mellitus           (if abnormal result ? is confirmed)  5.7-6.4%   Increased risk of developing Diabetes Mellitus  References:Diagnosis and Classification  of Diabetes Mellitus,Diabetes WUJW,1191,47(WGNFA 1):S62-S69 and Standards of Medical Care in         Diabetes - 2011,Diabetes OZHY,8657,84  ?(Suppl 1):S11-S61.  ? GFR 69.01 10/02/2018 11:14 AM  ? GFR 59.18 (L) 02/20/2018 10:53 AM  ?  ?Last diabetic Eye exam:  ?No results found for: HMDIABEYEEXA  ?Last diabetic Foot exam:  ?No results found for: HMDIABFOOTEX  ? ?Lab Results  ?Component Value Date  ? CHOL 102 10/28/2021  ? HDL 45 10/28/2021  ? [place] 43 10/28/2021  ? TRIG 64 10/28/2021  ? CHOLHDL 2.3 10/28/2021  ? ? ? ?  Latest Ref Rng & Units 12/08/2021  ?  1:40 PM 10/28/2021  ? 10:01 AM 06/08/2021  ?  1:56 PM  ?Hepatic Function  ?Total Protein 6.5 - 8.1 g/dL 6.9   6.4   6.5    ?Albumin 3.5 - 5.0 g/dL 3.9   3.9   3.4    ?AST 15 - 41 U/L 31   34   27    ?ALT 0 - 44 U/L 30   37   42    ?Alk Phosphatase 38 - 126 U/L 57   65   61    ?Total Bilirubin 0.3 - 1.2 mg/dL 1.3   0.8   1.2    ? ? ?Lab Results  ?Component Value Date/Time  ? TSH 0.185 (L) 10/28/2021 10:01 AM  ? TSH 0.02 Repeated and verified X2. (L) 07/08/2020 11:39 AM  ? FREET4 2.19 (H) 10/28/2021 10:01 AM  ? FREET4 1.62 (H) 07/08/2020 11:39 AM  ? ? ? ?  Latest Ref Rng & Units 12/08/2021  ?  1:40 PM 06/08/2021  ?  1:56 PM 05/19/2021  ?  8:45 AM  ?CBC  ?WBC 4.0 - 10.5 K/uL 6.1   6.5   8.8    ?Hemoglobin 13.0 - 17.0 g/dL 16.9   17.4   14.8    ?Hematocrit 39.0 - 52.0 % 51.0   52.3   44.7    ?Platelets 150 - 400 K/uL 139   130   134    ? ? ?Lab Results  ?Component Value Date/Time  ? VD25OH 56.45 08/16/2017 03:07 PM  ? VD25OH 35 02/06/2013 05:46 AM  ? ? ?Clinical ASCVD: No  ?The ASCVD Risk score (Arnett DK, et al., 2019) failed to calculate for the following reasons: ?  The valid total cholesterol range is 130 to 320 mg/dL   ? ? ?  01/07/2022  ? 10:07 AM 07/08/2021  ? 10:35 AM 04/02/2021  ? 10:34 AM  ?Depression screen PHQ 2/9  ?Decreased Interest 0 0 0  ?Down, Depressed, Hopeless 0 0 1  ?PHQ - 2 Score 0 0 1  ?Altered sleeping  2 1  ?Tired, decreased energy  1 1  ?Change in  appetite  0 0  ?Feeling bad or failure about yourself   0 0  ?Trouble concentrating  0 0  ?Moving slowly or fidgety/restless  0 0  ?Suicidal thoughts  0 0  ?PHQ-9 Score  3 3  ?Difficult doing work/chores  Not difficult at all Not difficult at all  ? ?Social History  ? ?Tobacco Use  ?Smoking Status Former  ? Packs/day: 1.00  ? Years: 50.00  ? Pack years: 50.00  ? Types: Cigarettes  ? Quit date: 01/12/2012  ? Years since quitting: 10.0  ?Smokeless Tobacco Never  ? ?BP Readings from Last 3 Encounters:  ?01/06/22 122/82  ?12/08/21 118/60  ?10/28/21 122/64  ? ?Pulse Readings from Last 3 Encounters:  ?01/06/22 84  ?12/08/21 76  ?10/28/21 70  ? ?Wt Readings from Last 3 Encounters:  ?01/06/22 285 lb 15 oz (129.7 kg)  ?12/08/21 285 lb 14.4 oz (129.7 kg)  ?10/28/21 287 lb 8 oz (130.4 kg)  ? ?BMI Readings from Last 3 Encounters:  ?01/06/22 35.74 kg/m?  ?12/08/21 35.74 kg/m?  ?10/28/21 35.94 kg/m?  ? ? ?Assessment/Interventions: Review of patient past medical history, allergies, medications, health status, including review of consultants reports, laboratory and other test data, was performed as part of comprehensive evaluation and provision of chronic care management services.  ? ?SDOH:  (Social Determinants of Health) assessments and interventions performed: Yes ? ?SDOH Screenings  ? ?Alcohol Screen: Low Risk   ? Last Alcohol Screening Score (AUDIT): 0  ?Depression (PHQ2-9): Low Risk   ? PHQ-2 Score: 0  ?Financial Resource Strain: Low Risk   ? Difficulty of Paying Living Expenses: Not hard at all  ?Food Insecurity: No Food Insecurity  ? Worried About Charity fundraiser in the Last Year: Never true  ? Ran Out of Food in the Last Year: Never true  ?Housing: Low Risk   ? Last Housing Risk Score: 0  ?Physical Activity: Sufficiently Active  ? Days of Exercise per Week: 3 days  ? Minutes of Exercise per Session: 60 min  ?Social Connections: Socially Integrated  ? Frequency of Communication with Friends and Family: More than three  times a week  ? Frequency of Social Gatherings with Friends and Family: More than three times a week  ? Attends Religious Services: 1 to 4 times per year  ? Active Member of Clubs or Organizations: No  ? Att

## 2022-01-13 NOTE — Patient Instructions (Signed)
Visit Information ? ?Following are the goals we discussed today:  ? ?Manage My Medicine  ? ?Timeframe:  Long-Range Goal ?Priority:  Medium ?Start Date:      01/13/2022                       ?Expected End Date:    01/14/2023                  ? ?Follow Up Date 07/2022 ?  ?- call for medicine refill 2 or 3 days before it runs out ?- call if I am sick and can't take my medicine ?- keep a list of all the medicines I take; vitamins and herbals too ?- learn to read medicine labels ?- use a pillbox to sort medicine  ?  ?Why is this important?   ?These steps will help you keep on track with your medicines. ? ?Plan: Telephone follow up appointment with care management team member scheduled for:  6 months ?The patient has been provided with contact information for the care management team and has been advised to call with any health related questions or concerns.  ? ?[name], PharmD ?Clinical Pharmacist, [place]  ? ?Please call the care guide team at [phone] if you need to cancel or reschedule your appointment.  ? ?Patient verbalizes understanding of instructions and care plan provided today and agrees to view in [place]. Active MyChart status confirmed with patient.   ? ?

## 2022-01-25 ENCOUNTER — Ambulatory Visit (INDEPENDENT_AMBULATORY_CARE_PROVIDER_SITE_OTHER): Payer: Medicare PPO | Admitting: Internal Medicine

## 2022-01-25 ENCOUNTER — Encounter: Payer: Self-pay | Admitting: Internal Medicine

## 2022-01-25 DIAGNOSIS — R229 Localized swelling, mass and lump, unspecified: Secondary | ICD-10-CM | POA: Diagnosis not present

## 2022-01-25 DIAGNOSIS — C8330 Diffuse large B-cell lymphoma, unspecified site: Secondary | ICD-10-CM | POA: Diagnosis not present

## 2022-01-25 DIAGNOSIS — J301 Allergic rhinitis due to pollen: Secondary | ICD-10-CM | POA: Insufficient documentation

## 2022-01-25 DIAGNOSIS — J4520 Mild intermittent asthma, uncomplicated: Secondary | ICD-10-CM | POA: Insufficient documentation

## 2022-01-25 DIAGNOSIS — R634 Abnormal weight loss: Secondary | ICD-10-CM | POA: Insufficient documentation

## 2022-01-25 DIAGNOSIS — J3081 Allergic rhinitis due to animal (cat) (dog) hair and dander: Secondary | ICD-10-CM | POA: Insufficient documentation

## 2022-01-25 DIAGNOSIS — J300 Vasomotor rhinitis: Secondary | ICD-10-CM | POA: Insufficient documentation

## 2022-01-25 DIAGNOSIS — J309 Allergic rhinitis, unspecified: Secondary | ICD-10-CM | POA: Insufficient documentation

## 2022-01-25 DIAGNOSIS — J3089 Other allergic rhinitis: Secondary | ICD-10-CM | POA: Diagnosis not present

## 2022-01-25 LAB — CBC WITH DIFFERENTIAL/PLATELET
Basophils Absolute: 0 10*3/uL (ref 0.0–0.1)
Basophils Relative: 0.5 % (ref 0.0–3.0)
Eosinophils Absolute: 0.1 10*3/uL (ref 0.0–0.7)
Eosinophils Relative: 1.7 % (ref 0.0–5.0)
HCT: 49.1 % (ref 39.0–52.0)
Hemoglobin: 16.3 g/dL (ref 13.0–17.0)
Lymphocytes Relative: 23.3 % (ref 12.0–46.0)
Lymphs Abs: 1.4 10*3/uL (ref 0.7–4.0)
MCHC: 33.3 g/dL (ref 30.0–36.0)
MCV: 93.8 fl (ref 78.0–100.0)
Monocytes Absolute: 0.4 10*3/uL (ref 0.1–1.0)
Monocytes Relative: 6.1 % (ref 3.0–12.0)
Neutro Abs: 4 10*3/uL (ref 1.4–7.7)
Neutrophils Relative %: 68.4 % (ref 43.0–77.0)
Platelets: 142 10*3/uL — ABNORMAL LOW (ref 150.0–400.0)
RBC: 5.24 Mil/uL (ref 4.22–5.81)
RDW: 15.2 % (ref 11.5–15.5)
WBC: 5.9 10*3/uL (ref 4.0–10.5)

## 2022-01-25 LAB — COMPREHENSIVE METABOLIC PANEL
ALT: 29 U/L (ref 0–53)
AST: 29 U/L (ref 0–37)
Albumin: 3.8 g/dL (ref 3.5–5.2)
Alkaline Phosphatase: 58 U/L (ref 39–117)
BUN: 27 mg/dL — ABNORMAL HIGH (ref 6–23)
CO2: 28 mEq/L (ref 19–32)
Calcium: 9.6 mg/dL (ref 8.4–10.5)
Chloride: 100 mEq/L (ref 96–112)
Creatinine, Ser: 1.34 mg/dL (ref 0.40–1.50)
GFR: 51.51 mL/min — ABNORMAL LOW (ref >60.00)
Glucose, Bld: 92 mg/dL (ref 70–99)
Potassium: 4.2 mEq/L (ref 3.5–5.1)
Sodium: 138 mEq/L (ref 135–145)
Total Bilirubin: 1.3 mg/dL — ABNORMAL HIGH (ref 0.2–1.2)
Total Protein: 6.7 g/dL (ref 6.0–8.3)

## 2022-01-25 LAB — TSH: TSH: 0.15 u[IU]/mL — ABNORMAL LOW (ref 0.35–5.50)

## 2022-01-25 MED ORDER — MUPIROCIN 2 % EX OINT: TOPICAL_OINTMENT | CUTANEOUS | 0 refills | Status: DC

## 2022-01-25 MED ORDER — DOXYCYCLINE HYCLATE 100 MG PO TABS: 100.0000 mg | ORAL_TABLET | Freq: Two times a day (BID) | ORAL | 0 refills | Status: DC

## 2022-01-25 NOTE — Assessment & Plan Note (Signed)
3/23 ?Intentional vs non-intentional wt loss ?New skin nodules on L buttock. R/o lymphoma mets vs other. ?Pt will needs skin bx w/Dr Elvera Lennox ASAP ?F/u w/Dr Irene Limbo - pending. ?Empiric Doxy and Bactroban ?Intentional vs non-ntentional ?New skin nodules on L buttock. R/o lymphoma mets vs other. ?Pt will needs skin bx w/Dr Elvera Lennox ASAP ?F/u w/Dr Irene Limbo - pending. ?Empiric Doxy and Bactroban ?

## 2022-01-25 NOTE — Assessment & Plan Note (Signed)
New on L buttock. R/o lymphoma mets vs other. ?Pt will needs skin bx w/Dr Elvera Lennox ASAP ?F/u w/Dr Irene Limbo - pending. ?Empiric Doxy and Bactroban ? ?

## 2022-01-25 NOTE — Progress Notes (Signed)
? ?Subjective:  ?Patient ID: [name], male    DOB: Aug 20, 1946  Age: 76 y.o. MRN: [id number] ? ?CC: No chief complaint on file. ? ? ?HPI ?[name] presents for skin lesions x 2-3 weeks, NT - enlarging. ? ? ?Changing color; one w/ulceration now... ? ?No c/o sweats, fever. Pt lost wt - not eating breakfast. ? ?Outpatient Medications Prior to Visit  ?Medication Sig Dispense Refill  ? acetaminophen (TYLENOL) 500 MG tablet Take 1,000 mg by mouth daily as needed for moderate pain.    ? albuterol (VENTOLIN HFA) 108 (90 Base) MCG/ACT inhaler Inhale 2 puffs into the lungs every 6 (six) hours as needed for wheezing or shortness of breath.    ? alfuzosin (UROXATRAL) 10 MG 24 hr tablet TAKE 1 TABLET AT BEDTIME 90 tablet 3  ? aspirin EC 81 MG tablet Take 81 mg by mouth daily.    ? buPROPion (WELLBUTRIN XL) 300 MG 24 hr tablet TAKE 1 TABLET EVERY DAY WITH BREAKFAST 90 tablet 3  ? Calcium Citrate-Vitamin D (CALCIUM CITRATE + D PO) Take 1 tablet by mouth 2 (two) times daily. '1200mg'$  of Calcium and 1000units vitamin D3    ? Cholecalciferol (VITAMIN D-3 PO) Take 2,000 Units by mouth 2 (two) times daily.     ? clonazePAM (KLONOPIN) 1 MG tablet Take 1 tablet (1 mg total) by mouth every 6 (six) hours as needed. for anxiety 120 tablet 0  ? denosumab (PROLIA) 60 MG/ML SOSY injection Inject 60 mg as directed every 6 (six) months. Last injection: Feb 2020 Next injection: August 2020    ? diltiazem (CARDIZEM CD) 240 MG 24 hr capsule TAKE 1 CAPSULE EVERY DAY 90 capsule 3  ? DULoxetine (CYMBALTA) 60 MG capsule TAKE 1 CAPSULE TWICE DAILY 180 capsule 3  ? EPINEPHrine 0.3 mg/0.3 mL IJ SOAJ injection See admin instructions.    ? ezetimibe (ZETIA) 10 MG tablet TAKE 1 TABLET EVERY DAY 90 tablet 2  ? ferrous sulfate 325 (65 FE) MG tablet Take 325 mg by mouth 2 (two) times daily with a meal.     ? hydrochlorothiazide (HYDRODIURIL) 12.5 MG tablet TAKE 1 TABLET (12.5 MG TOTAL) BY MOUTH DAILY. 90 tablet 3  ? HYDROcodone-acetaminophen  (NORCO) 7.5-325 MG tablet Take 1 tablet by mouth every 6 (six) hours as needed for severe pain. 120 tablet 0  ? isosorbide mononitrate (IMDUR) 30 MG 24 hr tablet TAKE 1 TABLET (30 MG TOTAL) BY MOUTH DAILY. 90 tablet 2  ? levothyroxine (SYNTHROID) 200 MCG tablet TAKE 1 TABLET EVERY DAY AT 6AM 90 tablet 3  ? loratadine (CLARITIN) 10 MG tablet Take 10 mg by mouth daily.     ? losartan (COZAAR) 50 MG tablet TAKE 1 TABLET EVERY DAY 90 tablet 2  ? Magnesium 500 MG CAPS Take 1 capsule (500 mg total) by mouth daily. (Patient taking differently: Take 500 mg by mouth every evening.) 30 capsule 1  ? metoprolol tartrate (LOPRESSOR) 25 MG tablet Take 1 tablet (25 mg total) by mouth 2 (two) times daily. 180 tablet 3  ? Multiple Vitamin (MULTIVITAMIN WITH MINERALS) TABS tablet Take 1 tablet by mouth daily. Men's One-A-Day 50+    ? MYRBETRIQ 25 MG TB24 tablet TAKE 1 TABLET EVERY DAY 90 tablet 3  ? nitroGLYCERIN (NITROSTAT) 0.4 MG SL tablet Place 0.4 mg under the tongue every 5 (five) minutes as needed for chest pain. x3 doses as needed for chest pain    ? omeprazole (PRILOSEC) 40 MG capsule TAKE  1 CAPSULE EVERY DAY BEFORE BREAKFAST 90 capsule 3  ? Potassium Citrate 15 MEQ (1620 MG) TBCR TAKE 1 TABLET TWICE DAILY 180 tablet 3  ? Probiotic Product (PROBIOTIC PO) Take 1 capsule by mouth daily with breakfast.    ? Risankizumab-rzaa (SKYRIZI PEN [place]) Inject 150 mg into the skin every 3 (three) months.    ? rivaroxaban (XARELTO) 10 MG TABS tablet Take 1 tablet (10 mg total) by mouth daily. 90 tablet 3  ? solifenacin (VESICARE) 10 MG tablet Take 10 mg by mouth daily.    ? testosterone enanthate (DELATESTRYL) 200 MG/ML injection INJECT 1ML ('200MG'$ ) INTO THE MUSCLE EVERY 14 DAYS. DISCARD VIAL AFTER 28 DAYS. 5 mL 3  ? tiZANidine (ZANAFLEX) 4 MG tablet TAKE 1 TABLET EVERY 6 HOURS AS NEEDED FOR MUSCLE SPASM(S) 360 tablet 1  ? vitamin C (ASCORBIC ACID) 500 MG tablet Take 500 mg by mouth daily.    ? atorvastatin (LIPITOR) 80 MG tablet Take 1  tablet (80 mg total) by mouth daily. 90 tablet 3  ? amoxicillin (AMOXIL) 500 MG capsule Take 2,000 mg by mouth See admin instructions. Take 4 capsules (2000 mg) by mouth 1 hour prior to dental work (Patient not taking: Reported on 10/28/2021)    ? ?No facility-administered medications prior to visit.  ? ? ?ROS: ?Review of Systems  ?Constitutional:  Positive for fatigue and unexpected weight change. Negative for appetite change, diaphoresis and fever.  ?HENT:  Negative for congestion, nosebleeds, sneezing, sore throat and trouble swallowing.   ?Eyes:  Negative for itching and visual disturbance.  ?Respiratory:  Negative for cough.   ?Cardiovascular:  Negative for chest pain, palpitations and leg swelling.  ?Gastrointestinal:  Negative for abdominal distention, blood in stool, diarrhea and nausea.  ?Genitourinary:  Negative for frequency and hematuria.  ?Musculoskeletal:  Positive for arthralgias, back pain and gait problem. Negative for joint swelling and neck pain.  ?Skin:  Positive for color change and wound. Negative for rash.  ?Neurological:  Negative for dizziness, tremors, speech difficulty and weakness.  ?Psychiatric/Behavioral:  Negative for agitation, dysphoric mood and sleep disturbance. The patient is not nervous/anxious.   ? ?Objective:  ?BP 110/68 (BP Location: Left Arm, Patient Position: Sitting, Cuff Size: Large)   Pulse 78   Temp 98.7 ?F (37.1 ?C) (Oral)   Ht '6\' 3"'$  (1.905 m)   Wt 275 lb (124.7 kg)   SpO2 94%   BMI 34.37 kg/m?  ? ?BP Readings from Last 3 Encounters:  ?01/25/22 110/68  ?01/06/22 122/82  ?12/08/21 118/60  ? ? ?Wt Readings from Last 3 Encounters:  ?01/25/22 275 lb (124.7 kg)  ?01/06/22 285 lb 15 oz (129.7 kg)  ?12/08/21 285 lb 14.4 oz (129.7 kg)  ? ? ?Physical Exam ?Constitutional:   ?   General: He is not in acute distress. ?   Appearance: He is well-developed.  ?   Comments: NAD  ?Eyes:  ?   Conjunctiva/sclera: Conjunctivae normal.  ?   Pupils: Pupils are equal, round, and reactive  to light.  ?Neck:  ?   Thyroid: No thyromegaly.  ?   Vascular: No JVD.  ?Cardiovascular:  ?   Rate and Rhythm: Normal rate and regular rhythm.  ?   Heart sounds: Normal heart sounds. No murmur heard. ?  No friction rub. No gallop.  ?Pulmonary:  ?   Effort: Pulmonary effort is normal. No respiratory distress.  ?   Breath sounds: Normal breath sounds. No wheezing or rales.  ?Chest:  ?  Chest wall: No tenderness.  ?Abdominal:  ?   General: Bowel sounds are normal. There is no distension.  ?   Palpations: Abdomen is soft. There is no mass.  ?   Tenderness: There is no abdominal tenderness. There is no guarding or rebound.  ?Musculoskeletal:     ?   General: No tenderness. Normal range of motion.  ?   Cervical back: Normal range of motion.  ?Lymphadenopathy:  ?   Cervical: No cervical adenopathy.  ?Skin: ?   General: Skin is warm and dry.  ?   Findings: No rash.  ?Neurological:  ?   Mental Status: He is alert and oriented to person, place, and time.  ?   Cranial Nerves: No cranial nerve deficit.  ?   Motor: No abnormal muscle tone.  ?   Coordination: Coordination normal.  ?   Gait: Gait normal.  ?   Deep Tendon Reflexes: Reflexes are normal and symmetric.  ?Psychiatric:     ?   Behavior: Behavior normal.     ?   Thought Content: Thought content normal.     ?   Judgment: Judgment normal.  ? ? ?Lab Results  ?Component Value Date  ? WBC 6.1 12/08/2021  ? HGB 16.9 12/08/2021  ? HCT 51.0 12/08/2021  ? PLT 139 (L) 12/08/2021  ? GLUCOSE 99 12/08/2021  ? CHOL 102 10/28/2021  ? TRIG 64 10/28/2021  ? HDL 45 10/28/2021  ? [place] 43 10/28/2021  ? ALT 30 12/08/2021  ? AST 31 12/08/2021  ? NA 136 12/08/2021  ? K 4.6 12/08/2021  ? CL 101 12/08/2021  ? CREATININE 1.35 (H) 12/08/2021  ? BUN 25 (H) 12/08/2021  ? CO2 30 12/08/2021  ? TSH 0.185 (L) 10/28/2021  ? PSA 1.08 06/26/2020  ? INR 1.0 03/26/2019  ? HGBA1C 5.8 (H) 02/06/2013  ? ? ?No results found. ? ?Assessment & Plan:  ? ?Problem List Items Addressed This Visit   ? ? Diffuse  large B-cell lymphoma ([place])  ?  3/23 ?Intentional vs non-intentional wt loss ?New skin nodules on L buttock. R/o lymphoma mets vs other. ?Pt will needs skin bx w/Dr Elvera Lennox ASAP ?F/u w/Dr Irene Limbo - pending. ?Empiric Doxy

## 2022-01-25 NOTE — Assessment & Plan Note (Addendum)
Intentional vs non-ntentional ?New skin nodules on L buttock. R/o lymphoma mets vs other. ?Pt will needs skin bx w/Dr Elvera Lennox ASAP ?F/u w/Dr Irene Limbo - pending. ?Empiric Doxy and Bactroban ?

## 2022-01-26 ENCOUNTER — Other Ambulatory Visit (INDEPENDENT_AMBULATORY_CARE_PROVIDER_SITE_OTHER): Payer: Medicare PPO

## 2022-01-26 DIAGNOSIS — R634 Abnormal weight loss: Secondary | ICD-10-CM

## 2022-01-26 LAB — T4, FREE: Free T4: 2.08 ng/dL — ABNORMAL HIGH (ref 0.60–1.60)

## 2022-01-26 LAB — LACTATE DEHYDROGENASE: LDH: 251 U/L — ABNORMAL HIGH (ref 120–250)

## 2022-01-26 NOTE — Progress Notes (Signed)
Faxed [facility] add-on slip for FT4../lm,b

## 2022-01-27 ENCOUNTER — Other Ambulatory Visit: Payer: Self-pay | Admitting: Internal Medicine

## 2022-01-27 DIAGNOSIS — E039 Hypothyroidism, unspecified: Secondary | ICD-10-CM

## 2022-01-27 MED ORDER — LEVOTHYROXINE SODIUM 200 MCG PO TABS: ORAL_TABLET | ORAL | 3 refills | Status: DC

## 2022-01-28 DIAGNOSIS — F331 Major depressive disorder, recurrent, moderate: Secondary | ICD-10-CM | POA: Diagnosis not present

## 2022-01-28 DIAGNOSIS — F410 Panic disorder [episodic paroxysmal anxiety] without agoraphobia: Secondary | ICD-10-CM | POA: Diagnosis not present

## 2022-01-29 DIAGNOSIS — Z85828 Personal history of other malignant neoplasm of skin: Secondary | ICD-10-CM | POA: Diagnosis not present

## 2022-01-29 DIAGNOSIS — C8339 Diffuse large B-cell lymphoma, extranodal and solid organ sites: Secondary | ICD-10-CM | POA: Diagnosis not present

## 2022-01-29 DIAGNOSIS — D485 Neoplasm of uncertain behavior of skin: Secondary | ICD-10-CM | POA: Diagnosis not present

## 2022-02-07 DIAGNOSIS — I110 Hypertensive heart disease with heart failure: Secondary | ICD-10-CM

## 2022-02-07 DIAGNOSIS — I2510 Atherosclerotic heart disease of native coronary artery without angina pectoris: Secondary | ICD-10-CM | POA: Diagnosis not present

## 2022-02-07 DIAGNOSIS — I480 Paroxysmal atrial fibrillation: Secondary | ICD-10-CM

## 2022-02-11 ENCOUNTER — Telehealth: Payer: Self-pay | Admitting: Hematology

## 2022-02-11 NOTE — Telephone Encounter (Signed)
Scheduled appt per 5/4 referral. Pt is aware of appt date and time. Pt is aware to arrive 15 mins prior to appt time and to bring and updated insurance card. Pt is aware of appt location.   ?

## 2022-02-22 DIAGNOSIS — J3089 Other allergic rhinitis: Secondary | ICD-10-CM | POA: Diagnosis not present

## 2022-02-22 DIAGNOSIS — J301 Allergic rhinitis due to pollen: Secondary | ICD-10-CM | POA: Diagnosis not present

## 2022-02-24 ENCOUNTER — Emergency Department (HOSPITAL_COMMUNITY)
Admission: EM | Admit: 2022-02-24 | Discharge: 2022-02-24 | Disposition: A | Discharge status: Home / Self Care | Payer: Medicare PPO | Attending: Emergency Medicine | Admitting: Emergency Medicine

## 2022-02-24 ENCOUNTER — Emergency Department (HOSPITAL_COMMUNITY): Payer: Medicare PPO

## 2022-02-24 ENCOUNTER — Other Ambulatory Visit (INDEPENDENT_AMBULATORY_CARE_PROVIDER_SITE_OTHER): Payer: Medicare PPO

## 2022-02-24 ENCOUNTER — Other Ambulatory Visit: Payer: Self-pay

## 2022-02-24 ENCOUNTER — Encounter (HOSPITAL_COMMUNITY): Payer: Self-pay | Admitting: Emergency Medicine

## 2022-02-24 DIAGNOSIS — R4182 Altered mental status, unspecified: Secondary | ICD-10-CM | POA: Insufficient documentation

## 2022-02-24 DIAGNOSIS — R410 Disorientation, unspecified: Secondary | ICD-10-CM | POA: Insufficient documentation

## 2022-02-24 DIAGNOSIS — Z7982 Long term (current) use of aspirin: Secondary | ICD-10-CM | POA: Insufficient documentation

## 2022-02-24 DIAGNOSIS — S0990XA Unspecified injury of head, initial encounter: Secondary | ICD-10-CM | POA: Diagnosis not present

## 2022-02-24 DIAGNOSIS — Z7901 Long term (current) use of anticoagulants: Secondary | ICD-10-CM | POA: Insufficient documentation

## 2022-02-24 DIAGNOSIS — I4819 Other persistent atrial fibrillation: Secondary | ICD-10-CM | POA: Diagnosis not present

## 2022-02-24 DIAGNOSIS — L03031 Cellulitis of right toe: Secondary | ICD-10-CM | POA: Diagnosis not present

## 2022-02-24 DIAGNOSIS — I2510 Atherosclerotic heart disease of native coronary artery without angina pectoris: Secondary | ICD-10-CM | POA: Insufficient documentation

## 2022-02-24 DIAGNOSIS — E039 Hypothyroidism, unspecified: Secondary | ICD-10-CM

## 2022-02-24 DIAGNOSIS — I110 Hypertensive heart disease with heart failure: Secondary | ICD-10-CM | POA: Diagnosis not present

## 2022-02-24 DIAGNOSIS — R2681 Unsteadiness on feet: Secondary | ICD-10-CM | POA: Diagnosis not present

## 2022-02-24 DIAGNOSIS — B351 Tinea unguium: Secondary | ICD-10-CM | POA: Diagnosis not present

## 2022-02-24 DIAGNOSIS — Z79899 Other long term (current) drug therapy: Secondary | ICD-10-CM | POA: Diagnosis not present

## 2022-02-24 DIAGNOSIS — I739 Peripheral vascular disease, unspecified: Secondary | ICD-10-CM | POA: Diagnosis not present

## 2022-02-24 DIAGNOSIS — N390 Urinary tract infection, site not specified: Secondary | ICD-10-CM | POA: Diagnosis not present

## 2022-02-24 DIAGNOSIS — R269 Unspecified abnormalities of gait and mobility: Secondary | ICD-10-CM | POA: Diagnosis not present

## 2022-02-24 DIAGNOSIS — I5030 Unspecified diastolic (congestive) heart failure: Secondary | ICD-10-CM | POA: Insufficient documentation

## 2022-02-24 DIAGNOSIS — M205X1 Other deformities of toe(s) (acquired), right foot: Secondary | ICD-10-CM | POA: Diagnosis not present

## 2022-02-24 DIAGNOSIS — M205X2 Other deformities of toe(s) (acquired), left foot: Secondary | ICD-10-CM | POA: Diagnosis not present

## 2022-02-24 LAB — COMPREHENSIVE METABOLIC PANEL
ALT: 28 U/L (ref 0–44)
AST: 30 U/L (ref 15–41)
Albumin: 3.6 g/dL (ref 3.5–5.0)
Alkaline Phosphatase: 73 U/L (ref 38–126)
Anion gap: 9 (ref 5–15)
BUN: 37 mg/dL — ABNORMAL HIGH (ref 8–23)
CO2: 28 mmol/L (ref 22–32)
Calcium: 10.1 mg/dL (ref 8.9–10.3)
Chloride: 98 mmol/L (ref 98–111)
Creatinine, Ser: 1.61 mg/dL — ABNORMAL HIGH (ref 0.61–1.24)
GFR, Estimated: 44 mL/min — ABNORMAL LOW (ref >60)
Glucose, Bld: 106 mg/dL — ABNORMAL HIGH (ref 70–99)
Potassium: 4.9 mmol/L (ref 3.5–5.1)
Sodium: 135 mmol/L (ref 135–145)
Total Bilirubin: 1.5 mg/dL — ABNORMAL HIGH (ref 0.3–1.2)
Total Protein: 7.1 g/dL (ref 6.5–8.1)

## 2022-02-24 LAB — URINALYSIS, ROUTINE W REFLEX MICROSCOPIC
Bilirubin Urine: NEGATIVE
Glucose, UA: NEGATIVE mg/dL
Hgb urine dipstick: NEGATIVE
Ketones, ur: 5 mg/dL — AB
Nitrite: NEGATIVE
Protein, ur: NEGATIVE mg/dL
Specific Gravity, Urine: 1.02 (ref 1.005–1.030)
WBC, UA: >50 WBC/hpf — ABNORMAL HIGH (ref 0–5)
pH: 5 (ref 5.0–8.0)

## 2022-02-24 LAB — CBC WITH DIFFERENTIAL/PLATELET
Abs Immature Granulocytes: 0.04 10*3/uL (ref 0.00–0.07)
Basophils Absolute: 0 10*3/uL (ref 0.0–0.1)
Basophils Relative: 0 %
Eosinophils Absolute: 0.1 10*3/uL (ref 0.0–0.5)
Eosinophils Relative: 1 %
HCT: 51.4 % (ref 39.0–52.0)
Hemoglobin: 16.8 g/dL (ref 13.0–17.0)
Immature Granulocytes: 1 %
Lymphocytes Relative: 19 %
Lymphs Abs: 1.5 10*3/uL (ref 0.7–4.0)
MCH: 31 pg (ref 26.0–34.0)
MCHC: 32.7 g/dL (ref 30.0–36.0)
MCV: 94.8 fL (ref 80.0–100.0)
Monocytes Absolute: 0.6 10*3/uL (ref 0.1–1.0)
Monocytes Relative: 7 %
Neutro Abs: 5.6 10*3/uL (ref 1.7–7.7)
Neutrophils Relative %: 72 %
Platelets: 165 10*3/uL (ref 150–400)
RBC: 5.42 MIL/uL (ref 4.22–5.81)
RDW: 15.1 % (ref 11.5–15.5)
WBC: 7.8 10*3/uL (ref 4.0–10.5)
nRBC: 0 % (ref 0.0–0.2)

## 2022-02-24 LAB — T4, FREE: Free T4: 0.87 ng/dL (ref 0.60–1.60)

## 2022-02-24 LAB — TSH: TSH: 14.36 u[IU]/mL — ABNORMAL HIGH (ref 0.35–5.50)

## 2022-02-24 LAB — CBG MONITORING, ED: Glucose-Capillary: 97 mg/dL (ref 70–99)

## 2022-02-24 MED ORDER — CEPHALEXIN 500 MG PO CAPS: 500.0000 mg | ORAL_CAPSULE | Freq: Three times a day (TID) | ORAL | 0 refills | Status: AC

## 2022-02-24 MED ORDER — SODIUM CHLORIDE 0.9 % IV SOLN
1.0000 g | Freq: Once | INTRAVENOUS | Status: AC
  Administered 2022-02-24: 1 g via INTRAVENOUS
  Filled 2022-02-24: qty 10

## 2022-02-24 NOTE — ED Provider Triage Note (Signed)
Emergency Medicine Provider Triage Evaluation Note  [name] , a 76 y.o. male  was evaluated in triage.  Pt complains of mild confusion. History of recurrent UTI. History of non-hodgkin lymphoma, with recent diagnosis of hodgkin lymphoma.  Review of Systems  Positive: confusion Negative: Chest pain, shortness of breath, focal weakness  Physical Exam  BP 130/81 (BP Location: Left Arm)   Pulse (!) 56   Temp 97.9 F (36.6 C) (Oral)   Resp 18   Ht '6\' 3"'$  (1.905 m)   Wt 124.7 kg   SpO2 94%   BMI 34.37 kg/m  Gen:   Awake, no distress   Resp:  Normal effort  MSK:   Moves extremities without difficulty  Other:  Negative stroke screen  Medical Decision Making  Medically screening exam initiated at 2:34 PM.  Appropriate orders placed.  MEGAN HAYDUK was informed that the remainder of the evaluation will be completed by another provider, this initial triage assessment does not replace that evaluation, and the importance of remaining in the ED until their evaluation is complete.     Etta Quill, NP 02/24/22 1642

## 2022-02-24 NOTE — ED Notes (Signed)
Pt. IV removed with needle intact. ?

## 2022-02-24 NOTE — ED Notes (Signed)
Called lab to add on urine culture - they advise they will run it. ?

## 2022-02-24 NOTE — ED Notes (Signed)
Pt transported to CT ?

## 2022-02-24 NOTE — ED Provider Notes (Signed)
[facility] DEPT Provider Note   CSN: [id number] Arrival date & time: 02/24/22  1232     History  Chief Complaint  Patient presents with   Altered Mental Status    [name] is a 76 y.o. male.  HPI    4-5 weeks ago, confusion and memory problems. Confused about the date.  Progressed over last few weeks. Fell down, the way he was walking.  Not having lightheadedness, no syncope. Didn't hit head.  Denies numbness, weakness, difficulty talking or walking, visual changes or facial droop.  Progressive confusion  No fever, chills, nausea, vomiting, abdominal pain, flank  Sometimes back hurts if up walking for a while Headaches, some not severe but the other day had one, not consistent    Past Medical History:  Diagnosis Date   Anticoagulant long-term use    xarelto--- takes for AFib,  managed by pcp, Dr Alain Marion   Benign localized prostatic hyperplasia with lower urinary tract symptoms (LUTS)    CAD (coronary artery disease) cardiologist--  dr t. Oval Linsey   hx of known CAD obstruction w/ collaterals (cath done @ [facility] 12-31-2011) ;   last cardiac cath 08-13-2019  showed sig. 2V CAD involving proxLAD and CTO of the RCA/  chronic total occlusion midRCA w/ bridging and L>R collaterals   Diastolic CHF ([place])    dx [phone] hospital admission (followed by pcp)   Diffuse large B cell lymphoma [facility]) oncologist-- dr Irene Limbo--- in remission   dx 08/ 2019 -- bx left tonsill mass-- involving lymph nodes--- completed chemotherapy 10-12-2018   DJD (degenerative joint disease)    Dysthymic disorder    Elevated brain natriuretic peptide (BNP) level    Environmental allergies    GAD (generalized anxiety disorder)    GERD (gastroesophageal reflux disease)    History of falling    multiple times   History of Graves' disease    1987 s/p total thyroidectomy   History of kidney stones    History of scabies    07/ 2014  resolved   History of syncope    per  D/C note in epic 02-21-2013 ?vasovagal   History of vertebral compression fracture    05/ 2014 --  L1, no surgical intervention   HLD (hyperlipidemia)    Hyperlipidemia    Hypertension    Hypogonadism in male    IDA (iron deficiency anemia)    Lumbago    OA (osteoarthritis)    OSA on CPAP    cpap machine-settings 17   Osteoporosis    Severe   Persistent atrial fibrillation [facility])    cardiologist--- dr Alfonso Patten. [place]   Plaque psoriasis    dermatologist--- dr Harriett Sine--- currently taking otezla   Post-surgical hypothyroidism    followed by pcp---  s/p total thyroidectomy for graves disease in 1987   Pure hypercholesterolemia 02/10/2021   Renal calculus, right    Thrombocytopenia (HCC)    Unsteady gait    uses walker   Wears hearing aid in both ears      Home Medications Prior to Admission medications   Medication Sig Start Date End Date Taking? Authorizing Provider  cephALEXin (KEFLEX) 500 MG capsule Take 1 capsule (500 mg total) by mouth 3 (three) times daily for 7 days. 02/24/22 03/03/22 Yes Gareth Morgan, MD  acetaminophen (TYLENOL) 500 MG tablet Take 1,000 mg by mouth daily as needed for moderate pain.    [provider]  albuterol (VENTOLIN HFA) 108 (90 Base) MCG/ACT inhaler Inhale 2  puffs into the lungs every 6 (six) hours as needed for wheezing or shortness of breath.    [provider]  alfuzosin (UROXATRAL) 10 MG 24 hr tablet TAKE 1 TABLET AT BEDTIME 03/19/21   McKenzie, Candee Furbish, MD  aspirin EC 81 MG tablet Take 81 mg by mouth daily.    [provider]  atorvastatin (LIPITOR) 80 MG tablet Take 1 tablet (80 mg total) by mouth daily. 08/18/21 11/16/21  Skeet Latch, MD  buPROPion (WELLBUTRIN XL) 300 MG 24 hr tablet TAKE 1 TABLET EVERY DAY WITH BREAKFAST 02/20/21   Plotnikov, Evie Lacks, MD  Calcium Citrate-Vitamin D (CALCIUM CITRATE + D PO) Take 1 tablet by mouth 2 (two) times daily. '1200mg'$  of Calcium and 1000units vitamin D3    [provider]  Cholecalciferol (VITAMIN D-3 PO) Take 2,000 Units by mouth 2 (two) times daily.     [provider]  clonazePAM (KLONOPIN) 1 MG tablet Take 1 tablet (1 mg total) by mouth every 6 (six) hours as needed. for anxiety 08/28/21   Biagio Borg, MD  denosumab (PROLIA) 60 MG/ML SOSY injection Inject 60 mg as directed every 6 (six) months. Last injection: Feb 2020 Next injection: August 2020 12/10/15   [provider]  diltiazem (CARDIZEM CD) 240 MG 24 hr capsule TAKE 1 CAPSULE EVERY DAY 03/23/21   Plotnikov, Evie Lacks, MD  doxycycline (VIBRA-TABS) 100 MG tablet Take 1 tablet (100 mg total) by mouth 2 (two) times daily. 01/25/22   Plotnikov, Evie Lacks, MD  DULoxetine (CYMBALTA) 60 MG capsule TAKE 1 CAPSULE TWICE DAILY 03/23/21   Plotnikov, Evie Lacks, MD  EPINEPHrine 0.3 mg/0.3 mL IJ SOAJ injection See admin instructions. 07/31/21   [provider]  ezetimibe (ZETIA) 10 MG tablet TAKE 1 TABLET EVERY DAY 08/10/21   Skeet Latch, MD  ferrous sulfate 325 (65 FE) MG tablet Take 325 mg by mouth 2 (two) times daily with a meal.     [provider]  hydrochlorothiazide (HYDRODIURIL) 12.5 MG tablet TAKE 1 TABLET (12.5 MG TOTAL) BY MOUTH DAILY. 06/23/21   Loel Dubonnet, NP  HYDROcodone-acetaminophen (NORCO) 7.5-325 MG tablet Take 1 tablet by mouth every 6 (six) hours as needed for severe pain. 09/30/20   Plotnikov, Evie Lacks, MD  isosorbide mononitrate (IMDUR) 30 MG 24 hr tablet TAKE 1 TABLET (30 MG TOTAL) BY MOUTH DAILY. 08/10/21   Skeet Latch, MD  levothyroxine (SYNTHROID) 200 MCG tablet Take 100 mcg every other day alternating with 200 mcg every other day 01/27/22   Plotnikov, Evie Lacks, MD  loratadine (CLARITIN) 10 MG tablet Take 10 mg by mouth daily.     [provider]  losartan (COZAAR) 50 MG tablet TAKE 1 TABLET EVERY DAY 06/18/21   Skeet Latch, MD  Magnesium 500 MG CAPS Take 1 capsule (500 mg total) by mouth daily. Patient taking  differently: Take 500 mg by mouth every evening. 10/13/18   Brunetta Genera, MD  metoprolol tartrate (LOPRESSOR) 25 MG tablet Take 1 tablet (25 mg total) by mouth 2 (two) times daily. 08/18/21   Skeet Latch, MD  Multiple Vitamin (MULTIVITAMIN WITH MINERALS) TABS tablet Take 1 tablet by mouth daily. Men's One-A-Day 50+    [provider]  mupirocin ointment (BACTROBAN) 2 % On leg wound w/dressing change qd or bid 01/25/22   Plotnikov, Evie Lacks, MD  MYRBETRIQ 25 MG TB24 tablet TAKE 1 TABLET EVERY DAY 10/06/21   McKenzie, Candee Furbish, MD  nitroGLYCERIN (NITROSTAT) 0.4 MG  SL tablet Place 0.4 mg under the tongue every 5 (five) minutes as needed for chest pain. x3 doses as needed for chest pain    [provider]  omeprazole (PRILOSEC) 40 MG capsule TAKE 1 CAPSULE EVERY DAY BEFORE BREAKFAST 06/18/21   Plotnikov, Evie Lacks, MD  Potassium Citrate 15 MEQ (1620 MG) TBCR TAKE 1 TABLET TWICE DAILY 03/19/21   McKenzie, Candee Furbish, MD  Probiotic Product (PROBIOTIC PO) Take 1 capsule by mouth daily with breakfast.    [provider]  Risankizumab-rzaa (SKYRIZI PEN [place]) Inject 150 mg into the skin every 3 (three) months.    [provider]  rivaroxaban (XARELTO) 10 MG TABS tablet Take 1 tablet (10 mg total) by mouth daily. 06/29/21   Plotnikov, Evie Lacks, MD  solifenacin (VESICARE) 10 MG tablet Take 10 mg by mouth daily. 08/24/21   [provider]  testosterone enanthate (DELATESTRYL) 200 MG/ML injection INJECT 1ML ('200MG'$ ) INTO THE MUSCLE EVERY 14 DAYS. DISCARD VIAL AFTER 28 DAYS. 09/22/21   Plotnikov, Evie Lacks, MD  tiZANidine (ZANAFLEX) 4 MG tablet TAKE 1 TABLET EVERY 6 HOURS AS NEEDED FOR MUSCLE SPASM(S) 06/29/21   Plotnikov, Evie Lacks, MD  vitamin C (ASCORBIC ACID) 500 MG tablet Take 500 mg by mouth daily.    [provider]      Allergies    Short ragweed pollen ext    Review of Systems   Review of Systems  Physical Exam Updated Vital Signs BP 136/86    Pulse 67   Temp 97.9 F (36.6 C) (Oral)   Resp 19   Ht '6\' 3"'$  (1.905 m)   Wt 124.7 kg   SpO2 97%   BMI 34.37 kg/m  Physical Exam Vitals and nursing note reviewed.  Constitutional:      General: He is not in acute distress.    Appearance: He is well-developed. He is not diaphoretic.  HENT:     Head: Normocephalic and atraumatic.  Eyes:     Conjunctiva/sclera: Conjunctivae normal.  Cardiovascular:     Rate and Rhythm: Normal rate and regular rhythm.     Heart sounds: Normal heart sounds. No murmur heard.   No friction rub. No gallop.  Pulmonary:     Effort: Pulmonary effort is normal. No respiratory distress.     Breath sounds: Normal breath sounds. No wheezing or rales.  Abdominal:     General: There is no distension.     Palpations: Abdomen is soft.     Tenderness: There is no abdominal tenderness. There is no guarding.  Musculoskeletal:     Cervical back: Normal range of motion.     Comments: Wound to right great toe 5cm lesion left buttock with central ulceration, no erythema or fluctuance  Skin:    General: Skin is warm and dry.  Neurological:     Mental Status: He is alert and oriented to person, place, and time.    ED Results / Procedures / Treatments   Labs (all labs ordered are listed, but only abnormal results are displayed) Labs Reviewed  COMPREHENSIVE METABOLIC PANEL - Abnormal; Notable for the following components:      Result Value   Glucose, Bld 106 (*)    BUN 37 (*)    Creatinine, Ser 1.61 (*)    Total Bilirubin 1.5 (*)    GFR, Estimated 44 (*)    All other components within normal limits  URINALYSIS, ROUTINE W REFLEX MICROSCOPIC - Abnormal; Notable for the following components:  Color, Urine AMBER (*)    APPearance CLOUDY (*)    Ketones, ur 5 (*)    Leukocytes,Ua MODERATE (*)    WBC, UA >50 (*)    Bacteria, UA MANY (*)    All other components within normal limits  URINE CULTURE  CBC WITH DIFFERENTIAL/PLATELET  CBG MONITORING, ED     EKG None  Radiology CT Head Wo Contrast  Result Date: 02/24/2022 CLINICAL DATA:  Head trauma. EXAM: CT HEAD WITHOUT CONTRAST TECHNIQUE: Contiguous axial images were obtained from the base of the skull through the vertex without intravenous contrast. RADIATION DOSE REDUCTION: This exam was performed according to the departmental dose-optimization program which includes automated exposure control, adjustment of the mA and/or kV according to patient size and/or use of iterative reconstruction technique. COMPARISON:  Head CT dated 11/08/2018. FINDINGS: Brain: Mild age-related atrophy and chronic microvascular ischemic changes. There is no acute intracranial hemorrhage. No mass effect or midline shift. No extra-axial fluid collection. Vascular: No hyperdense vessel or unexpected calcification. Skull: Normal. Negative for fracture or focal lesion. Sinuses/Orbits: The visualized paranasal sinuses and the left mastoid air cells are clear. Mild right mastoid effusions. Other: None IMPRESSION: 1. No acute intracranial pathology. 2. Mild age-related atrophy and chronic microvascular ischemic changes. Electronically Signed   By: Anner Crete M.D.   On: 02/24/2022 20:35    Procedures Procedures    Medications Ordered in ED Medications  cefTRIAXone (ROCEPHIN) 1 g in sodium chloride 0.9 % 100 mL IVPB (0 g Intravenous Stopped 02/24/22 2126)    ED Course/ Medical Decision Making/ A&P                           Medical Decision Making Amount and/or Complexity of Data Reviewed Independent Historian: spouse External Data Reviewed: labs and notes. Labs: ordered. Decision-making details documented in ED Course. Radiology: ordered and independent interpretation performed. Decision-making details documented in ED Course.  Risk Prescription drug management.   76 year old male with history of diffuse large B-cell lymphoma, CHF, coronary artery disease, atrial fibrillation on Xarelto, hypothyroidism,  recent diagnosis of cutaneous B-cell lymphoma who presents to concern for 5 weeks of confusion.  Differential diagnosis includes anemia, electrolyte abnormality, cardiac abnormality, infection, hypothyroidism, other toxic/metabolic abnormalities.  No focal neurologic concerns on history or exam to suggest CVA, ICH or other central etiology.  Pt hemodynamically stable, afebrile, and without hx of infectious symptoms.  Urinalysis consistent with UTI. CBC showed no sign of anemia.  Electrolytes without significant abnormalities.  CT head completed and evaluated by me without acute itntracranial pathology.  Symptoms likely secondary to urinary tract infection.  Consider other intracranial etiologies/metastases/dementia on differential.  He is seeing oncologist tomorrow and further work up/outpt MRI could be discussed.     Given does of rocephin, rx for keflex for UTI.  No vital sign abnormalities, leukocytosis to suggest need for admission for sepsis.  Patient discharged in stable condition with understanding of reasons to return.           Final Clinical Impression(s) / ED Diagnoses Final diagnoses:  Altered mental status, unspecified altered mental status type  Urinary tract infection without hematuria, site unspecified    Rx / DC Orders ED Discharge Orders          Ordered    cephALEXin (KEFLEX) 500 MG capsule  3 times daily        02/24/22 2040  Gareth Morgan, MD 02/25/22 [phone]

## 2022-02-24 NOTE — ED Triage Notes (Signed)
Pt states he is feeling confused and having frequent falls. Pt states he thinks he has a UTI. Pt also reports he had a biopsy to the right hip about 2 weeks ago and it is now bleeding. ?

## 2022-02-24 NOTE — ED Notes (Signed)
Discharge instructions reviewed, questions answered. Rx education provided. Pt states understanding and no further questions. Pt ambulatory with steady gait with walker upon discharge. No s/s of distress noted. ? ?

## 2022-02-25 ENCOUNTER — Encounter: Payer: Self-pay | Admitting: Internal Medicine

## 2022-02-25 ENCOUNTER — Inpatient Hospital Stay: Payer: Medicare PPO | Attending: Hematology | Admitting: Hematology

## 2022-02-25 VITALS — BP 139/58 | HR 89 | Temp 97.2°F | Resp 20 | Wt 261.2 lb

## 2022-02-25 DIAGNOSIS — Z08 Encounter for follow-up examination after completed treatment for malignant neoplasm: Secondary | ICD-10-CM | POA: Diagnosis not present

## 2022-02-25 DIAGNOSIS — Z8572 Personal history of non-Hodgkin lymphomas: Secondary | ICD-10-CM | POA: Diagnosis not present

## 2022-02-25 DIAGNOSIS — C8330 Diffuse large B-cell lymphoma, unspecified site: Secondary | ICD-10-CM

## 2022-02-26 DIAGNOSIS — J3089 Other allergic rhinitis: Secondary | ICD-10-CM | POA: Diagnosis not present

## 2022-02-26 DIAGNOSIS — J301 Allergic rhinitis due to pollen: Secondary | ICD-10-CM | POA: Diagnosis not present

## 2022-02-26 LAB — URINE CULTURE: Culture: >=100000 — AB

## 2022-02-27 ENCOUNTER — Telehealth (HOSPITAL_BASED_OUTPATIENT_CLINIC_OR_DEPARTMENT_OTHER): Payer: Self-pay | Admitting: *Deleted

## 2022-02-27 NOTE — Telephone Encounter (Signed)
Post ED Visit - Positive Culture Follow-up  Culture report reviewed by antimicrobial stewardship pharmacist: [place] Team '[]'$  Elenor Quinones, Pharm.D. '[]'$  Heide Guile, Pharm.D., BCPS AQ-ID '[]'$  Parks Neptune, Pharm.D., BCPS '[]'$  Alycia Rossetti, Pharm.D., BCPS '[]'$  [place]ainwright, Pharm.D., BCPS, AAHIVP '[]'$  [name], Pharm.D., BCPS, AAHIVP '[]'$  Salome Arnt, PharmD, BCPS '[]'$  Johnnette Gourd, PharmD, BCPS '[]'$  Hughes Better, PharmD, BCPS '[]'$  Leeroy Cha, PharmD '[]'$  Laqueta Linden, PharmD, BCPS '[]'$  Albertina Parr, PharmD  [place] Team '[]'$  Leodis Sias, PharmD '[]'$  Lindell Spar, PharmD '[]'$  Royetta Asal, PharmD '[]'$  Graylin Shiver, Rph '[]'$  Rema Fendt) Glennon Mac, PharmD '[]'$  Arlyn Dunning, PharmD '[]'$  Netta Cedars, PharmD '[]'$  Dia Sitter, PharmD '[]'$  Leone Haven, PharmD '[]'$  Gretta Arab, PharmD '[]'$  Theodis Shove, PharmD '[]'$  Peggyann Juba, PharmD '[x]'$  Reuel Boom, PharmD   Positive urine culture Treated with Cephalexin, organism sensitive to the same and no further patient follow-up is required at this time.  Rosie Fate 02/27/2022, 11:54 AM

## 2022-03-01 ENCOUNTER — Telehealth: Payer: Self-pay | Admitting: Hematology

## 2022-03-01 NOTE — Telephone Encounter (Signed)
Scheduled follow-up appointment per 5/18 los. Patient is aware. 

## 2022-03-02 ENCOUNTER — Ambulatory Visit (HOSPITAL_COMMUNITY)
Admission: RE | Admit: 2022-03-02 | Discharge: 2022-03-02 | Disposition: A | Discharge status: Home / Self Care | Payer: Medicare PPO | Source: Ambulatory Visit | Attending: Hematology | Admitting: Hematology

## 2022-03-02 DIAGNOSIS — R918 Other nonspecific abnormal finding of lung field: Secondary | ICD-10-CM | POA: Insufficient documentation

## 2022-03-02 DIAGNOSIS — R590 Localized enlarged lymph nodes: Secondary | ICD-10-CM | POA: Diagnosis not present

## 2022-03-02 DIAGNOSIS — C8330 Diffuse large B-cell lymphoma, unspecified site: Secondary | ICD-10-CM | POA: Diagnosis not present

## 2022-03-02 LAB — GLUCOSE, CAPILLARY: Glucose-Capillary: 107 mg/dL — ABNORMAL HIGH (ref 70–99)

## 2022-03-02 MED ORDER — FLUDEOXYGLUCOSE F - 18 (FDG) INJECTION
12.8000 | Freq: Once | INTRAVENOUS | Status: AC
  Administered 2022-03-02: 12.8 via INTRAVENOUS

## 2022-03-03 ENCOUNTER — Encounter: Payer: Self-pay | Admitting: Hematology

## 2022-03-03 NOTE — Progress Notes (Signed)
HEMATOLOGY/ONCOLOGY CLINIC NOTE  Date of Service: .02/25/2022    Patient Care Team: Cassandria Anger, MD as PCP - General (Internal Medicine) Skeet Latch, MD as PCP - Cardiology (Cardiology) Brunetta Genera, MD as Consulting Physician (Hematology) McKenzie, Candee Furbish, MD as Consulting Physician (Urology) Jackquline Denmark, MD as Consulting Physician (Gastroenterology) Skeet Latch, MD as Attending Physician (Cardiology) Lynnell Dike, [place] as Consulting Physician (Optometry) Szabat, Darnelle Maffucci, [facility] as Pharmacist (Pharmacist) Harriett Sine, MD as Consulting Physician (Dermatology)  CHIEF COMPLAINTS/PURPOSE OF CONSULTATION:  Follow-up for concerns of recurrent lymphoma.  HISTORY OF PRESENTING ILLNESS:   Please see previous note for details on initial presentation.   INTERVAL HISTORY   [name] is 76 y.o. male has been referred back to [place] for concerns of recurrent large B-cell lymphoma.  He noted a subcutaneous mass growing over the left thigh.  This has been progressively enlarging in the last 1 to 2 months and he has also developed some skin ulceration.  Patient was seen by his dermatologist and had a shave biopsy that showed concern for large B-cell lymphoma double tissue was not available for flow cytometry and other molecular studies. He was recently in the hospital for altered mental status and had a CT of the head which showed no acute intracranial lesions.  He was noted to have a urinary tract infection and has received antibiotics to treat this. Labs done today were discussed in detail.  MEDICAL HISTORY:  Past Medical History:  Diagnosis Date   Anticoagulant long-term use    xarelto--- takes for AFib,  managed by pcp, Dr Alain Marion   Benign localized prostatic hyperplasia with lower urinary tract symptoms (LUTS)    CAD (coronary artery disease) cardiologist--  dr t. Oval Linsey   hx of known CAD obstruction w/ collaterals (cath done @ [facility]  12-31-2011) ;   last cardiac cath 08-13-2019  showed sig. 2V CAD involving proxLAD and CTO of the RCA/  chronic total occlusion midRCA w/ bridging and L>R collaterals   Diastolic CHF ([place])    dx [phone] hospital admission (followed by pcp)   Diffuse large B cell lymphoma [facility]) oncologist-- dr Irene Limbo--- in remission   dx 08/ 2019 -- bx left tonsill mass-- involving lymph nodes--- completed chemotherapy 10-12-2018   DJD (degenerative joint disease)    Dysthymic disorder    Elevated brain natriuretic peptide (BNP) level    Environmental allergies    GAD (generalized anxiety disorder)    GERD (gastroesophageal reflux disease)    History of falling    multiple times   History of Graves' disease    1987 s/p total thyroidectomy   History of kidney stones    History of scabies    07/ 2014  resolved   History of syncope    per D/C note in epic 02-21-2013 ?vasovagal   History of vertebral compression fracture    05/ 2014 --  L1, no surgical intervention   HLD (hyperlipidemia)    Hyperlipidemia    Hypertension    Hypogonadism in male    IDA (iron deficiency anemia)    Lumbago    OA (osteoarthritis)    OSA on CPAP    cpap machine-settings 17   Osteoporosis    Severe   Persistent atrial fibrillation [facility])    cardiologist--- dr Alfonso Patten. [place]   Plaque psoriasis    dermatologist--- dr Harriett Sine--- currently taking otezla   Post-surgical hypothyroidism    followed by pcp---  s/p total thyroidectomy for  graves disease in 32   Pure hypercholesterolemia 02/10/2021   Renal calculus, right    Thrombocytopenia (HCC)    Unsteady gait    uses walker   Wears hearing aid in both ears     SURGICAL HISTORY: Past Surgical History:  Procedure Laterality Date   APPENDECTOMY  2011 approx.    CARDIAC CATHETERIZATION  12-31-2011   dr j. Beatrix Fetters '@[facility]'$    normal LVF w/ multivessel CAD,  occluded RCA w/ colleterals   CATARACT EXTRACTION W/ INTRAOCULAR LENS  IMPLANT, BILATERAL  2016 approx.    COLONOSCOPY     CYSTOSCOPY WITH RETROGRADE PYELOGRAM, URETEROSCOPY AND STENT PLACEMENT Left 11/07/2017   Procedure: CYSTOSCOPY WITH RETROGRADE PYELOGRAM, URETEROSCOPY AND STENT PLACEMENT;  Surgeon: Cleon Gustin, MD;  [facility];  Service: Urology;  Laterality: Left;   CYSTOSCOPY WITH RETROGRADE PYELOGRAM, URETEROSCOPY AND STENT PLACEMENT Right 10/22/2019   Procedure: CYSTOSCOPY WITH RETROGRADE PYELOGRAM, URETEROSCOPY AND STENT PLACEMENT;  Surgeon: Cleon Gustin, MD;  Location: [facility];  Service: Urology;  Laterality: Right;   CYSTOSCOPY/URETEROSCOPY/HOLMIUM LASER/STENT PLACEMENT Right 03/11/2017   Procedure: CYSTOSCOPY/URETEROSCOPYSTENT PLACEMENT right ureter retrograde pylegram;  Surgeon: Cleon Gustin, MD;  [facility];  Service: Urology;  Laterality: Right;   HARDWARE REMOVAL  10/05/2012   Procedure: HARDWARE REMOVAL;  Surgeon: [name], MD;  [facility];  Service: Orthopedics;  Laterality: Right;  REMOVING  STRYKER  GAMMA NAIL   HARDWARE REMOVAL Right 07/03/2013   Procedure: HARDWARE REMOVAL RIGHT TIBIA ;  Surgeon: Rozanna Box, MD;  Location: [place];  Service: Orthopedics;  Laterality: Right;   HIP CLOSED REDUCTION Right 10/15/2013   Procedure: CLOSED MANIPULATION HIP;  Surgeon: [name], MD;  [facility];  Service: Orthopedics;  Laterality: Right;   HOLMIUM LASER APPLICATION Right 04/13/813   Procedure: HOLMIUM LASER APPLICATION;  Surgeon: Cleon Gustin, MD;  [facility];  Service: Urology;  Laterality: Right;   HOLMIUM LASER APPLICATION Left 4/81/8563   Procedure: HOLMIUM LASER APPLICATION;  Surgeon: Cleon Gustin, MD;  [facility];  Service: Urology;  Laterality: Left;   HOLMIUM LASER APPLICATION Left 10/14/9700   Procedure: HOLMIUM LASER APPLICATION;  Surgeon: Cleon Gustin, MD;  [facility];  Service: Urology;  Laterality: Left;   HOLMIUM LASER APPLICATION Right 6/37/8588   Procedure: HOLMIUM  LASER APPLICATION;  Surgeon: Cleon Gustin, MD;  Location: [facility];  Service: Urology;  Laterality: Right;   INCISION AND DRAINAGE HIP Right 11/16/2013   Procedure: IRRIGATION AND DEBRIDEMENT RIGHT HIP;  Surgeon: [name], MD;  [facility];  Service: Orthopedics;  Laterality: Right;   INTRAVASCULAR PRESSURE WIRE/FFR STUDY N/A 08/13/2019   Procedure: INTRAVASCULAR PRESSURE WIRE/FFR STUDY;  Surgeon: Nelva Bush, MD;  Location: [place] [facility];  Service: Cardiovascular;  Laterality: N/A;   IR IMAGING GUIDED PORT INSERTION  06/22/2018   IR NEPHROSTOMY PLACEMENT LEFT  03/28/2019   IR URETERAL STENT LEFT NEW ACCESS W/O SEP NEPHROSTOMY CATH  10/24/2017   IR URETERAL STENT LEFT NEW ACCESS W/O SEP NEPHROSTOMY CATH  03/26/2019   NEPHROLITHOTOMY Right 02/08/2017   Procedure: NEPHROLITHOTOMY PERCUTANEOUS WITH SURGEON ACCESS;  Surgeon: Cleon Gustin, MD;  [facility];  Service: Urology;  Laterality: Right;   NEPHROLITHOTOMY Left 10/24/2017   Procedure: NEPHROLITHOTOMY PERCUTANEOUS;  Surgeon: Cleon Gustin, MD;  [facility];  Service: Urology;  Laterality: Left;   NEPHROLITHOTOMY Left 03/26/2019   Procedure: NEPHROLITHOTOMY PERCUTANEOUS;  Surgeon:  McKenzie, Candee Furbish, MD;  [facility];  Service: Urology;  Laterality: Left;  2 HRS   NEPHROLITHOTOMY Left 04/17/2019   Procedure: NEPHROLITHOTOMY PERCUTANEOUS;  Surgeon: Cleon Gustin, MD;  [facility];  Service: Urology;  Laterality: Left;  2 HRS   ORIF TIBIA FRACTURE Right 02/06/2013   Procedure: OPEN REDUCTION INTERNAL FIXATION (ORIF) TIBIA FRACTURE WITH IM ROD FIBULA;  Surgeon: Rozanna Box, MD;  Location: [place];  Service: Orthopedics;  Laterality: Right;   ORIF TIBIA FRACTURE Right 07/03/2013   Procedure: RIGHT TIBIA NON UNION REPAIR ;  Surgeon: Rozanna Box, MD;  Location: [place];  Service: Orthopedics;  Laterality: Right;   ORIF WRIST FRACTURE  10/02/2012   Procedure: OPEN REDUCTION  INTERNAL FIXATION (ORIF) WRIST FRACTURE;  Surgeon: Roseanne Kaufman, MD;  [facility];  Service: Orthopedics;  Laterality: Right;  WITH   ANTIBIOTIC  CEMENT   ORIF WRIST FRACTURE Left 10/28/2013   Procedure: OPEN REDUCTION INTERNAL FIXATION (ORIF) WRIST FRACTURE with allograft;  Surgeon: Roseanne Kaufman, MD;  [facility];  Service: Orthopedics;  Laterality: Left;  DVR Plate   QUADRICEPS TENDON REPAIR Left 07/15/2017   Procedure: REPAIR QUADRICEP TENDON;  Surgeon: Frederik Pear, MD;  Location: [place];  Service: Orthopedics;  Laterality: Left;   RIGHT/LEFT HEART CATH AND CORONARY ANGIOGRAPHY N/A 08/13/2019   Procedure: RIGHT/LEFT HEART CATH AND CORONARY ANGIOGRAPHY;  Surgeon: Nelva Bush, MD;  Location: [place] [facility];  Service: Cardiovascular;  Laterality: N/A;   THYROIDECTOMY  02/1986   TOTAL HIP ARTHROPLASTY Right 03-16-2016   '@[facility]'$    TOTAL HIP REVISION  10/05/2012   Procedure: TOTAL HIP REVISION;  Surgeon: [name], MD;  [facility];  Service: Orthopedics;  Laterality: Right;  RIGHT TOTAL HIP REVISION   TOTAL HIP REVISION Right 09/17/2013   Procedure: REVISION RIGHT TOTAL HIP ARTHROPLASTY ;  Surgeon: [name], MD;  [facility];  Service: Orthopedics;  Laterality: Right;   TOTAL HIP REVISION Right 10/26/2013   Procedure: REVISION RIGHT TOTAL HIP ARTHROPLASTY;  Surgeon: [name], MD;  [facility];  Service: Orthopedics;  Laterality: Right;   TOTAL KNEE ARTHROPLASTY Bilateral right 03-15-2011;  left 06-30-2011   TOTAL KNEE REVISION Left 04/11/2017   Procedure: TOTAL KNEE REVISION PATELLA and TIBIA;  Surgeon: Frederik Pear, MD;  Location: [place];  Service: Orthopedics;  Laterality: Left;    SOCIAL HISTORY: Social History   Socioeconomic History   Marital status: Married    Spouse name: Not on file   Number of children: Not on file   Years of education: Not on file   Highest education level: Not on file  Occupational History   Not on file  Tobacco  Use   Smoking status: Former    Packs/day: 1.00    Years: 50.00    Pack years: 50.00    Types: Cigarettes    Quit date: 01/12/2012    Years since quitting: 10.1   Smokeless tobacco: Never  Vaping Use   Vaping Use: Never used  Substance and Sexual Activity   Alcohol use: Not Currently    Comment: occasional-social   Drug use: Never   Sexual activity: Yes  Other Topics Concern   Not on file  Social History Narrative   [place] 2.5 months, went home Feb 21st slipped and fell on back and developed.  Home PT/OT.  Just started outpatient physical therapy.  Friday night, misstepped.     Social Determinants of Health  Financial Resource Strain: Low Risk    Difficulty of Paying Living Expenses: Not hard at all  Food Insecurity: No Food Insecurity   Worried About Charity fundraiser in the Last Year: Never true   Ran Out of Food in the Last Year: Never true  Transportation Needs: No Transportation Needs   Lack of Transportation (Medical): No   Lack of Transportation (Non-Medical): No  Physical Activity: Sufficiently Active   Days of Exercise per Week: 3 days   Minutes of Exercise per Session: 60 min  Stress: No Stress Concern Present   Feeling of Stress : Not at all  Social Connections: Socially Integrated   Frequency of Communication with Friends and Family: More than three times a week   Frequency of Social Gatherings with Friends and Family: More than three times a week   Attends Religious Services: 1 to 4 times per year   Active Member of [employer] or Organizations: No   Attends Music therapist: 1 to 4 times per year   Marital Status: Married  Human resources officer Violence: Not At Risk   Fear of Current or Ex-Partner: No   Emotionally Abused: No   Physically Abused: No   Sexually Abused: No    FAMILY HISTORY: Family History  Problem Relation Age of Onset   CAD Father 24   Asthma Father    Alcohol abuse Father    Arthritis Mother    Alcohol abuse Sister      ALLERGIES:  is allergic to short ragweed pollen ext.  MEDICATIONS:  Current Outpatient Medications  Medication Sig Dispense Refill   acetaminophen (TYLENOL) 500 MG tablet Take 1,000 mg by mouth daily as needed for moderate pain.     albuterol (VENTOLIN HFA) 108 (90 Base) MCG/ACT inhaler Inhale 2 puffs into the lungs every 6 (six) hours as needed for wheezing or shortness of breath.     alfuzosin (UROXATRAL) 10 MG 24 hr tablet TAKE 1 TABLET AT BEDTIME 90 tablet 3   aspirin EC 81 MG tablet Take 81 mg by mouth daily.     atorvastatin (LIPITOR) 80 MG tablet Take 1 tablet (80 mg total) by mouth daily. 90 tablet 3   buPROPion (WELLBUTRIN XL) 300 MG 24 hr tablet TAKE 1 TABLET EVERY DAY WITH BREAKFAST 90 tablet 3   Calcium Citrate-Vitamin D (CALCIUM CITRATE + D PO) Take 1 tablet by mouth 2 (two) times daily. '1200mg'$  of Calcium and 1000units vitamin D3     cephALEXin (KEFLEX) 500 MG capsule Take 1 capsule (500 mg total) by mouth 3 (three) times daily for 7 days. 21 capsule 0   Cholecalciferol (VITAMIN D-3 PO) Take 2,000 Units by mouth 2 (two) times daily.      clonazePAM (KLONOPIN) 1 MG tablet Take 1 tablet (1 mg total) by mouth every 6 (six) hours as needed. for anxiety 120 tablet 0   denosumab (PROLIA) 60 MG/ML SOSY injection Inject 60 mg as directed every 6 (six) months. Last injection: Feb 2020 Next injection: August 2020     diltiazem (CARDIZEM CD) 240 MG 24 hr capsule TAKE 1 CAPSULE EVERY DAY 90 capsule 3   doxycycline (VIBRA-TABS) 100 MG tablet Take 1 tablet (100 mg total) by mouth 2 (two) times daily. 28 tablet 0   DULoxetine (CYMBALTA) 60 MG capsule TAKE 1 CAPSULE TWICE DAILY 180 capsule 3   EPINEPHrine 0.3 mg/0.3 mL IJ SOAJ injection See admin instructions.     ezetimibe (ZETIA) 10 MG tablet TAKE 1 TABLET EVERY  DAY 90 tablet 2   ferrous sulfate 325 (65 FE) MG tablet Take 325 mg by mouth 2 (two) times daily with a meal.      hydrochlorothiazide (HYDRODIURIL) 12.5 MG tablet TAKE 1 TABLET  (12.5 MG TOTAL) BY MOUTH DAILY. 90 tablet 3   HYDROcodone-acetaminophen (NORCO) 7.5-325 MG tablet Take 1 tablet by mouth every 6 (six) hours as needed for severe pain. 120 tablet 0   isosorbide mononitrate (IMDUR) 30 MG 24 hr tablet TAKE 1 TABLET (30 MG TOTAL) BY MOUTH DAILY. 90 tablet 2   levothyroxine (SYNTHROID) 200 MCG tablet Take 100 mcg every other day alternating with 200 mcg every other day 90 tablet 3   loratadine (CLARITIN) 10 MG tablet Take 10 mg by mouth daily.      losartan (COZAAR) 50 MG tablet TAKE 1 TABLET EVERY DAY 90 tablet 2   Magnesium 500 MG CAPS Take 1 capsule (500 mg total) by mouth daily. (Patient taking differently: Take 500 mg by mouth every evening.) 30 capsule 1   metoprolol tartrate (LOPRESSOR) 25 MG tablet Take 1 tablet (25 mg total) by mouth 2 (two) times daily. 180 tablet 3   Multiple Vitamin (MULTIVITAMIN WITH MINERALS) TABS tablet Take 1 tablet by mouth daily. Men's One-A-Day 50+     mupirocin ointment (BACTROBAN) 2 % On leg wound w/dressing change qd or bid 30 g 0   MYRBETRIQ 25 MG TB24 tablet TAKE 1 TABLET EVERY DAY 90 tablet 3   nitroGLYCERIN (NITROSTAT) 0.4 MG SL tablet Place 0.4 mg under the tongue every 5 (five) minutes as needed for chest pain. x3 doses as needed for chest pain     omeprazole (PRILOSEC) 40 MG capsule TAKE 1 CAPSULE EVERY DAY BEFORE BREAKFAST 90 capsule 3   Potassium Citrate 15 MEQ (1620 MG) TBCR TAKE 1 TABLET TWICE DAILY 180 tablet 3   Probiotic Product (PROBIOTIC PO) Take 1 capsule by mouth daily with breakfast.     Risankizumab-rzaa (SKYRIZI PEN [place]) Inject 150 mg into the skin every 3 (three) months.     rivaroxaban (XARELTO) 10 MG TABS tablet Take 1 tablet (10 mg total) by mouth daily. 90 tablet 3   solifenacin (VESICARE) 10 MG tablet Take 10 mg by mouth daily.     testosterone enanthate (DELATESTRYL) 200 MG/ML injection INJECT 1ML ('200MG'$ ) INTO THE MUSCLE EVERY 14 DAYS. DISCARD VIAL AFTER 28 DAYS. 5 mL 3   tiZANidine (ZANAFLEX) 4 MG  tablet TAKE 1 TABLET EVERY 6 HOURS AS NEEDED FOR MUSCLE SPASM(S) 360 tablet 1   vitamin C (ASCORBIC ACID) 500 MG tablet Take 500 mg by mouth daily.     No current facility-administered medications for this visit.    REVIEW OF SYSTEMS:   10 Point review of Systems was done is negative except as noted above.   PHYSICAL EXAMINATION: SKIN: chronic venous stasis changes in BLE, psoriasis rashes on elbows have improved with Otezla   ECOG FS:3 - Symptomatic, >50% confined to bed  Vitals:   02/25/22 1100  BP: (!) 139/58  Pulse: 89  Resp: 20  Temp: (!) 97.2 F (36.2 C)  SpO2: 96%   Wt Readings from Last 3 Encounters:  02/25/22 261 lb 3.2 oz (118.5 kg)  02/24/22 275 lb (124.7 kg)  01/25/22 275 lb (124.7 kg)    Body mass index is 32.65 kg/m.    NAD GENERAL:alert, in no acute distress and comfortable SKIN: no acute rashes, ulcerated firm lesion over the left buttocks EYES: conjunctiva are pink and  non-injected, sclera anicteric OROPHARYNX: MMM, no exudates, no oropharyngeal erythema or ulceration NECK: supple, no JVD LYMPH:  no palpable lymphadenopathy in the cervical, axillary or inguinal regions LUNGS: clear to auscultation b/l with normal respiratory effort HEART: regular rate & rhythm ABDOMEN:  normoactive bowel sounds , non tender, not distended. Extremity: no pedal edema PSYCH: alert & oriented x 3 with fluent speech NEURO: no focal motor/sensory deficits    LABORATORY DATA:  I have reviewed the data as listed     Latest Ref Rng & Units 02/24/2022    3:02 PM 01/25/2022    2:00 PM 12/08/2021    1:40 PM  CBC  WBC 4.0 - 10.5 K/uL 7.8   5.9   6.1    Hemoglobin 13.0 - 17.0 g/dL 16.8   16.3   16.9    Hematocrit 39.0 - 52.0 % 51.4   49.1   51.0    Platelets 150 - 400 K/uL 165   142.0   139         Latest Ref Rng & Units 02/24/2022    3:02 PM 01/25/2022    2:00 PM 12/08/2021    1:40 PM  CMP  Glucose 70 - 99 mg/dL 106   92   99    BUN 8 - 23 mg/dL 37   27   25     Creatinine 0.61 - 1.24 mg/dL 1.61   1.34   1.35    Sodium 135 - 145 mmol/L 135   138   136    Potassium 3.5 - 5.1 mmol/L 4.9   4.2   4.6    Chloride 98 - 111 mmol/L 98   100   101    CO2 22 - 32 mmol/L '28   28   30    '$ Calcium 8.9 - 10.3 mg/dL 10.1   9.6   9.6    Total Protein 6.5 - 8.1 g/dL 7.1   6.7   6.9    Total Bilirubin 0.3 - 1.2 mg/dL 1.5   1.3   1.3    Alkaline Phos 38 - 126 U/L 73   58   57    AST 15 - 41 U/L '30   29   31    '$ ALT 0 - 44 U/L '28   29   30     '$ . Lab Results  Component Value Date   LDH 251 (H) 01/25/2022   06/28/2019 ECHOCARDIOGRAM COMPLETE (Accession [id number])   05/29/18 Left tonsil biopsy:   RADIOGRAPHIC STUDIES: I have personally reviewed the radiological images as listed and agreed with the findings in the report. ASSESSMENT & PLAN:  76 y.o. male with  1. Diffuse Large B-Cell Lymphoma,- Stage III Left tonsil -05/29/18 Left tonsil biopsy which revealed Diffuse Large B-Cell Lymphoma, which requires treatment  -06/07/18 LDH at 214, will monitor change through treatment.  -Baseline ECHO 06/14/18: EF 50-55% -Standard regimen of R-CHOP with G-CSF support for 6 cycles starting 06/23/18  06/16/18 PET/CT revealed 1. Deauville 5 hypermetabolic lesions in the palatine tonsils, left internal jugular chain, and involving a right inguinal lymph node. Deauville 4 involvement of a left inguinal lymph node and an enlarged subcutaneous lesion favoring lymph node superficial to the lower right sternocleidomastoid muscle in the neck. 2. Other imaging findings of potential clinical significance: Aortic Atherosclerosis. Coronary atherosclerosis. Emphysema. Bilateral nonobstructive nephrolithiasis. Umbilical hernia contains adipose tissues. Old right rib fractures some of which are nonunited. 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   08/22/18 PET/CT revealed  Interval response to therapy. Overall Deauville criteria 3. Interval decrease in size and FDG uptake associated with palatine tonsil lesions.  There has been resolution of previous left level-II and bilateral inguinal lymph nodes. 2. Enlarged subcutaneous lesion superficial to the lower right sternocleidomastoid muscle exhibits decreased FDG uptake. Currently Deauville criteria 2. 3. No new or progressive sites of disease identified. 4.  Aortic Atherosclerosis. 5. Multi vessel coronary artery atherosclerotic calcifications. 6. Unchanged vertebra plana deformity. 7. Kidney stones.   S/p 6 cycles of R-CHOP completed on 10/12/18  11/22/18 PET/CT revealed The tonsillar mass has essentially resolved. The large presumed lymph node superficial to the right sternocleidomastoid is stable in size at 2.3 cm in short axis, with maximum SUV of 3.3 (formerly 2.4), representing Deauville 3 activity, previously Deauville 2. 2. Other imaging findings of potential clinical significance: Aortic Atherosclerosis and Emphysema. Coronary atherosclerosis. Bilateral nonobstructive nephrolithiasis. Umbilical hernia contains adipose tissue. Chronic high activity posterior to the right hip implant along a region of bony irregularity and deformity probably reflecting chronic inflammation. L1 vertebra plana.  2.  Recurrent large B-cell lymphoma with the left buttocks  Plan -We will get PET CT scan for restaging -Discussed results from his recent shave biopsy concerning for large B-cell lymphoma however sample is limited and flow cytometric could not be accurately done. -We will order ultrasound-guided repeat biopsy of the mass over the left buttocks. -Further treatment based on stage of the disease. -Patient with complete antibiotics for his urinary tract infection  Patient Active Problem List   Diagnosis Date Noted   Allergic rhinitis 01/25/2022   Allergic rhinitis due to animal (cat) (dog) hair and dander 01/25/2022   Allergic rhinitis due to pollen 01/25/2022   Mild intermittent asthma 01/25/2022   Vasomotor rhinitis 01/25/2022   Multiple skin nodules 01/25/2022    Weight loss 01/25/2022   Pure hypercholesterolemia 02/10/2021   Chronic diastolic heart failure ([place]) 06/21/2019   H/O nephrolithotomy with removal of calculi 03/26/2019   Renal calculus 03/26/2019   History of chemotherapy 11/21/2018   Lymphoma in remission ([place]) 11/21/2018   Generalized weakness 11/08/2018   Well adult exam 10/02/2018   Counseling regarding advance care planning and goals of care 07/09/2018   Diffuse large B-cell lymphoma ([place]) 06/23/2018   Port-A-Cath in place 06/23/2018   Hypogonadism in male 11/22/2017   Nephrolithiasis 10/24/2017   Recurrent dislocation of left patella 07/15/2017   Recurrent subluxation of patella, left 07/08/2017   At high risk for falls 05/17/2017   S/P revision of total knee 04/11/2017   Failed total knee, left, initial encounter ([place]) 04/09/2017   Renal calculus, right 02/08/2017   Protein-calorie malnutrition, severe ([place]) 12/08/2016   Chronic obstructive pulmonary disease ([place]) 12/08/2016   Staghorn renal calculus 11/05/2016   Senile osteoporosis 06/08/2016   Dyslipidemia 01/12/2016   Insomnia secondary to depression with anxiety 12/02/2015   Generalized anxiety disorder 11/19/2015   Wound drainage right hip 11/15/2013   Expected blood loss anemia 09/18/2013   Hyponatremia 09/18/2013   S/P right TH revision 09/17/2013   Benign prostatic hyperplasia without lower urinary tract symptoms 08/03/2013   History of gastroesophageal reflux (GERD) 08/03/2013   OSA (obstructive sleep apnea) 08/03/2013   Paroxysmal atrial fibrillation ([place]) 08/03/2013   Presence of unspecified artificial hip joint 08/03/2013   Nonunion, fracture, Right tibia  07/03/2013   UTI (urinary tract infection) 07/03/2013   Pathological fracture due to osteoporosis with delayed healing 04/24/2013   Osteoporosis with fracture 04/24/2013   Rash and nonspecific skin  eruption 03/08/2013   Syncope 02/21/2013   Chronic anticoagulation 02/21/2013   Fall 02/08/2013    Physical deconditioning 02/08/2013   Low back pain radiating to both legs 02/08/2013   Compression fracture of L1 lumbar vertebra ([place]) 02/08/2013   Right tibial fracture 02/05/2013   Morbid obesity ([place]) 02/05/2013   Osteomyelitis of right wrist ([place]) 11/02/2012   Anemia 10/06/2012   Essential hypertension 10/03/2012   Psoriasis 10/03/2012   Vitamin D deficiency 10/03/2012   GERD (gastroesophageal reflux disease) 10/03/2012   Anxiety 10/03/2012   Depression 10/03/2012   CAD (coronary artery disease) 10/03/2012   DJD (degenerative joint disease) 10/03/2012   Chronic gingivitis 10/03/2012   Low testosterone, possible hypogonadism 10/01/2012   Normocytic anemia 09/30/2012   Right femoral fracture ([place]) 09/29/2012   Right forearm fracture 09/29/2012   Longstanding persistent atrial fibrillation ([place]) 09/29/2012   Hypothyroidism 09/29/2012   History of recurrent UTIs 09/29/2012   FOLLOW UP: Pet/ct in 5 days US guided biopsy of left lateral gluteal lesion in 3 days Return to clinic with Dr. Irene Limbo in 2 weeks  The total time spent in the appointment was 31 minutes* All of the patient's questions were answered with apparent satisfaction. The patient knows to call the clinic with any problems, questions or concerns.   Sullivan Lone MD MS AAHIVMS [facility] Hematology/Oncology Physician [facility]  .*Total Encounter Time as defined by the Centers for Medicare and Medicaid Services includes, in addition to the face-to-face time of a patient visit (documented in the note above) non-face-to-face time: obtaining and reviewing outside history, ordering and reviewing medications, tests or procedures, care coordination (communications with other health care professionals or caregivers) and documentation in the medical record.

## 2022-03-04 DIAGNOSIS — F410 Panic disorder [episodic paroxysmal anxiety] without agoraphobia: Secondary | ICD-10-CM | POA: Diagnosis not present

## 2022-03-04 DIAGNOSIS — F331 Major depressive disorder, recurrent, moderate: Secondary | ICD-10-CM | POA: Diagnosis not present

## 2022-03-06 ENCOUNTER — Encounter (HOSPITAL_BASED_OUTPATIENT_CLINIC_OR_DEPARTMENT_OTHER): Payer: Self-pay

## 2022-03-06 ENCOUNTER — Emergency Department (HOSPITAL_BASED_OUTPATIENT_CLINIC_OR_DEPARTMENT_OTHER): Payer: Medicare PPO

## 2022-03-06 ENCOUNTER — Other Ambulatory Visit: Payer: Self-pay

## 2022-03-06 ENCOUNTER — Inpatient Hospital Stay (HOSPITAL_BASED_OUTPATIENT_CLINIC_OR_DEPARTMENT_OTHER)
Admission: EM | Admit: 2022-03-06 | Discharge: 2022-03-08 | DRG: 689 | Disposition: A | Discharge status: Home / Self Care | Payer: Medicare PPO | Attending: Internal Medicine | Admitting: Internal Medicine

## 2022-03-06 DIAGNOSIS — Z79890 Hormone replacement therapy (postmenopausal): Secondary | ICD-10-CM

## 2022-03-06 DIAGNOSIS — M810 Age-related osteoporosis without current pathological fracture: Secondary | ICD-10-CM | POA: Diagnosis present

## 2022-03-06 DIAGNOSIS — Z96653 Presence of artificial knee joint, bilateral: Secondary | ICD-10-CM | POA: Diagnosis present

## 2022-03-06 DIAGNOSIS — Z8249 Family history of ischemic heart disease and other diseases of the circulatory system: Secondary | ICD-10-CM | POA: Diagnosis not present

## 2022-03-06 DIAGNOSIS — Z96641 Presence of right artificial hip joint: Secondary | ICD-10-CM | POA: Diagnosis present

## 2022-03-06 DIAGNOSIS — Z7982 Long term (current) use of aspirin: Secondary | ICD-10-CM

## 2022-03-06 DIAGNOSIS — E890 Postprocedural hypothyroidism: Secondary | ICD-10-CM | POA: Diagnosis present

## 2022-03-06 DIAGNOSIS — I6782 Cerebral ischemia: Secondary | ICD-10-CM | POA: Diagnosis not present

## 2022-03-06 DIAGNOSIS — I5032 Chronic diastolic (congestive) heart failure: Secondary | ICD-10-CM

## 2022-03-06 DIAGNOSIS — N390 Urinary tract infection, site not specified: Principal | ICD-10-CM

## 2022-03-06 DIAGNOSIS — N1831 Chronic kidney disease, stage 3a: Secondary | ICD-10-CM | POA: Diagnosis not present

## 2022-03-06 DIAGNOSIS — R4182 Altered mental status, unspecified: Principal | ICD-10-CM

## 2022-03-06 DIAGNOSIS — G9520 Unspecified cord compression: Secondary | ICD-10-CM | POA: Diagnosis not present

## 2022-03-06 DIAGNOSIS — I10 Essential (primary) hypertension: Secondary | ICD-10-CM | POA: Diagnosis not present

## 2022-03-06 DIAGNOSIS — N3000 Acute cystitis without hematuria: Secondary | ICD-10-CM | POA: Diagnosis not present

## 2022-03-06 DIAGNOSIS — Z91048 Other nonmedicinal substance allergy status: Secondary | ICD-10-CM | POA: Diagnosis not present

## 2022-03-06 DIAGNOSIS — I480 Paroxysmal atrial fibrillation: Secondary | ICD-10-CM

## 2022-03-06 DIAGNOSIS — K219 Gastro-esophageal reflux disease without esophagitis: Secondary | ICD-10-CM | POA: Diagnosis present

## 2022-03-06 DIAGNOSIS — N400 Enlarged prostate without lower urinary tract symptoms: Secondary | ICD-10-CM | POA: Diagnosis present

## 2022-03-06 DIAGNOSIS — Z9221 Personal history of antineoplastic chemotherapy: Secondary | ICD-10-CM | POA: Diagnosis not present

## 2022-03-06 DIAGNOSIS — Z6830 Body mass index (BMI) 30.0-30.9, adult: Secondary | ICD-10-CM

## 2022-03-06 DIAGNOSIS — E785 Hyperlipidemia, unspecified: Secondary | ICD-10-CM | POA: Diagnosis present

## 2022-03-06 DIAGNOSIS — G9341 Metabolic encephalopathy: Secondary | ICD-10-CM | POA: Diagnosis not present

## 2022-03-06 DIAGNOSIS — I130 Hypertensive heart and chronic kidney disease with heart failure and stage 1 through stage 4 chronic kidney disease, or unspecified chronic kidney disease: Secondary | ICD-10-CM | POA: Diagnosis not present

## 2022-03-06 DIAGNOSIS — Z7901 Long term (current) use of anticoagulants: Secondary | ICD-10-CM | POA: Diagnosis not present

## 2022-03-06 DIAGNOSIS — N200 Calculus of kidney: Secondary | ICD-10-CM | POA: Diagnosis not present

## 2022-03-06 DIAGNOSIS — Z87891 Personal history of nicotine dependence: Secondary | ICD-10-CM

## 2022-03-06 DIAGNOSIS — K429 Umbilical hernia without obstruction or gangrene: Secondary | ICD-10-CM | POA: Diagnosis not present

## 2022-03-06 DIAGNOSIS — I2510 Atherosclerotic heart disease of native coronary artery without angina pectoris: Secondary | ICD-10-CM | POA: Diagnosis present

## 2022-03-06 DIAGNOSIS — Z87440 Personal history of urinary (tract) infections: Secondary | ICD-10-CM

## 2022-03-06 DIAGNOSIS — N183 Chronic kidney disease, stage 3 unspecified: Secondary | ICD-10-CM | POA: Diagnosis present

## 2022-03-06 DIAGNOSIS — E039 Hypothyroidism, unspecified: Secondary | ICD-10-CM | POA: Diagnosis not present

## 2022-03-06 DIAGNOSIS — E669 Obesity, unspecified: Secondary | ICD-10-CM | POA: Diagnosis not present

## 2022-03-06 DIAGNOSIS — R220 Localized swelling, mass and lump, head: Secondary | ICD-10-CM | POA: Diagnosis not present

## 2022-03-06 DIAGNOSIS — G4733 Obstructive sleep apnea (adult) (pediatric): Secondary | ICD-10-CM | POA: Diagnosis present

## 2022-03-06 DIAGNOSIS — R918 Other nonspecific abnormal finding of lung field: Secondary | ICD-10-CM | POA: Diagnosis present

## 2022-03-06 DIAGNOSIS — I4811 Longstanding persistent atrial fibrillation: Secondary | ICD-10-CM | POA: Diagnosis not present

## 2022-03-06 DIAGNOSIS — C8330 Diffuse large B-cell lymphoma, unspecified site: Secondary | ICD-10-CM | POA: Diagnosis present

## 2022-03-06 DIAGNOSIS — Z79899 Other long term (current) drug therapy: Secondary | ICD-10-CM | POA: Diagnosis not present

## 2022-03-06 DIAGNOSIS — H748X3 Other specified disorders of middle ear and mastoid, bilateral: Secondary | ICD-10-CM | POA: Diagnosis not present

## 2022-03-06 DIAGNOSIS — N281 Cyst of kidney, acquired: Secondary | ICD-10-CM | POA: Diagnosis not present

## 2022-03-06 DIAGNOSIS — Z0389 Encounter for observation for other suspected diseases and conditions ruled out: Secondary | ICD-10-CM | POA: Diagnosis not present

## 2022-03-06 DIAGNOSIS — I5022 Chronic systolic (congestive) heart failure: Secondary | ICD-10-CM | POA: Diagnosis present

## 2022-03-06 LAB — PROTIME-INR
INR: 1.1 (ref 0.8–1.2)
Prothrombin Time: 14.5 seconds (ref 11.4–15.2)

## 2022-03-06 LAB — CBC WITH DIFFERENTIAL/PLATELET
Abs Immature Granulocytes: 0.02 10*3/uL (ref 0.00–0.07)
Basophils Absolute: 0 10*3/uL (ref 0.0–0.1)
Basophils Relative: 0 %
Eosinophils Absolute: 0.1 10*3/uL (ref 0.0–0.5)
Eosinophils Relative: 1 %
HCT: 51 % (ref 39.0–52.0)
Hemoglobin: 17.1 g/dL — ABNORMAL HIGH (ref 13.0–17.0)
Immature Granulocytes: 0 %
Lymphocytes Relative: 15 %
Lymphs Abs: 1 10*3/uL (ref 0.7–4.0)
MCH: 30.9 pg (ref 26.0–34.0)
MCHC: 33.5 g/dL (ref 30.0–36.0)
MCV: 92.1 fL (ref 80.0–100.0)
Monocytes Absolute: 0.5 10*3/uL (ref 0.1–1.0)
Monocytes Relative: 7 %
Neutro Abs: 5.4 10*3/uL (ref 1.7–7.7)
Neutrophils Relative %: 77 %
Platelets: 164 10*3/uL (ref 150–400)
RBC: 5.54 MIL/uL (ref 4.22–5.81)
RDW: 15.8 % — ABNORMAL HIGH (ref 11.5–15.5)
WBC: 7 10*3/uL (ref 4.0–10.5)
nRBC: 0 % (ref 0.0–0.2)

## 2022-03-06 LAB — COMPREHENSIVE METABOLIC PANEL
ALT: 28 U/L (ref 0–44)
AST: 36 U/L (ref 15–41)
Albumin: 3.8 g/dL (ref 3.5–5.0)
Alkaline Phosphatase: 71 U/L (ref 38–126)
Anion gap: 12 (ref 5–15)
BUN: 28 mg/dL — ABNORMAL HIGH (ref 8–23)
CO2: 28 mmol/L (ref 22–32)
Calcium: 9.5 mg/dL (ref 8.9–10.3)
Chloride: 95 mmol/L — ABNORMAL LOW (ref 98–111)
Creatinine, Ser: 1.63 mg/dL — ABNORMAL HIGH (ref 0.61–1.24)
GFR, Estimated: 43 mL/min — ABNORMAL LOW (ref >60)
Glucose, Bld: 85 mg/dL (ref 70–99)
Potassium: 3.7 mmol/L (ref 3.5–5.1)
Sodium: 135 mmol/L (ref 135–145)
Total Bilirubin: 1.4 mg/dL — ABNORMAL HIGH (ref 0.3–1.2)
Total Protein: 7.1 g/dL (ref 6.5–8.1)

## 2022-03-06 LAB — URINALYSIS, ROUTINE W REFLEX MICROSCOPIC
Bilirubin Urine: NEGATIVE
Glucose, UA: NEGATIVE mg/dL
Hgb urine dipstick: NEGATIVE
Ketones, ur: NEGATIVE mg/dL
Nitrite: NEGATIVE
Specific Gravity, Urine: 1.015 (ref 1.005–1.030)
WBC, UA: >50 WBC/hpf — ABNORMAL HIGH (ref 0–5)
pH: 7 (ref 5.0–8.0)

## 2022-03-06 LAB — APTT: aPTT: 37 seconds — ABNORMAL HIGH (ref 24–36)

## 2022-03-06 LAB — LACTIC ACID, PLASMA: Lactic Acid, Venous: 1.3 mmol/L (ref 0.5–1.9)

## 2022-03-06 MED ORDER — RIVAROXABAN 10 MG PO TABS: 10.0000 mg | ORAL_TABLET | Freq: Every day | ORAL | Status: DC

## 2022-03-06 MED ORDER — RIVAROXABAN 10 MG PO TABS
10.0000 mg | ORAL_TABLET | Freq: Every day | ORAL | Status: DC
  Administered 2022-03-07 – 2022-03-08 (×2): 10 mg via ORAL
  Filled 2022-03-06 (×2): qty 1

## 2022-03-06 MED ORDER — ACETAMINOPHEN 325 MG PO TABS: 650.0000 mg | ORAL_TABLET | Freq: Four times a day (QID) | ORAL | Status: DC | PRN

## 2022-03-06 MED ORDER — SODIUM CHLORIDE 0.9 % IV SOLN
1.0000 g | INTRAVENOUS | Status: DC
  Filled 2022-03-06: qty 10

## 2022-03-06 MED ORDER — CEFTRIAXONE SODIUM 1 G IJ SOLR
1.0000 g | Freq: Once | INTRAMUSCULAR | Status: AC
  Administered 2022-03-06: 1 g via INTRAVENOUS
  Filled 2022-03-06: qty 10

## 2022-03-06 MED ORDER — IOHEXOL 300 MG/ML  SOLN
100.0000 mL | Freq: Once | INTRAMUSCULAR | Status: AC | PRN
  Administered 2022-03-06: 100 mL via INTRAVENOUS

## 2022-03-06 MED ORDER — ONDANSETRON HCL 4 MG PO TABS: 4.0000 mg | ORAL_TABLET | Freq: Four times a day (QID) | ORAL | Status: DC | PRN

## 2022-03-06 MED ORDER — METOPROLOL TARTRATE 25 MG PO TABS
25.0000 mg | ORAL_TABLET | Freq: Two times a day (BID) | ORAL | Status: DC
  Administered 2022-03-06 – 2022-03-08 (×4): 25 mg via ORAL
  Filled 2022-03-06 (×4): qty 1

## 2022-03-06 MED ORDER — DILTIAZEM HCL ER COATED BEADS 240 MG PO CP24
240.0000 mg | ORAL_CAPSULE | Freq: Every day | ORAL | Status: DC
  Administered 2022-03-07 – 2022-03-08 (×2): 240 mg via ORAL
  Filled 2022-03-06 (×2): qty 1

## 2022-03-06 MED ORDER — ONDANSETRON HCL 4 MG/2ML IJ SOLN: 4.0000 mg | Freq: Four times a day (QID) | INTRAMUSCULAR | Status: DC | PRN

## 2022-03-06 MED ORDER — ACETAMINOPHEN 650 MG RE SUPP: 650.0000 mg | Freq: Four times a day (QID) | RECTAL | Status: DC | PRN

## 2022-03-06 NOTE — Assessment & Plan Note (Signed)
Med rec pending 1. Hold diuretics for the moment.

## 2022-03-06 NOTE — Assessment & Plan Note (Addendum)
Not previously documented but looks like baseline creat is about 1.4-1.5 in 2022. 1.6 on 02/24/22 and again today.

## 2022-03-06 NOTE — Plan of Care (Signed)
Plan of care discussed. Attending paged for service.

## 2022-03-06 NOTE — Assessment & Plan Note (Addendum)
Home med rec pending: 1. Cont home metoprolol, cardizem 2. Cont home Xarelto

## 2022-03-06 NOTE — ED Provider Notes (Signed)
Care transferred to me.  Patient's lab work is overall unremarkable though he does have what appears to be a recurrent urinary tract infection.  He was given Rocephin by Dr. Kathrynn Humble.  CT abdomen pelvis images viewed/interpreted by myself and there is no obvious kidney obstruction/ureteral stone.  No renal abscess.  At this point, 6 bowels indicates that the patient is confused and this is getting worse and he is having urinary incontinence.  Sound like he will need an MRI.  For similar situation he had a CT head that was negative about a week ago.  Given the confusion I think he will need admission and I have consulted the hospitalist. Dr. Erlinda Hong accepts patient for admission to [facility]   Sherwood Gambler, MD 03/06/22 1745

## 2022-03-06 NOTE — ED Triage Notes (Signed)
Being treated for UTI.  States not getting better  Urinary frequency.  Denies pain

## 2022-03-06 NOTE — ED Notes (Signed)
[facility] at bedside and report given.

## 2022-03-06 NOTE — ED Notes (Signed)
Attempt to call report  Floor RN unavailable. Will call back

## 2022-03-06 NOTE — Assessment & Plan Note (Addendum)
Med rec pending. 1. TSH last week is elevated, though T4 is WNL 1. Doubt myxedema given nl T4 2. May want to increase synthroid dose when med rec results.

## 2022-03-06 NOTE — Assessment & Plan Note (Signed)
Home med rec pending still.

## 2022-03-06 NOTE — ED Provider Notes (Signed)
[facility] EMERGENCY DEPT Provider Note   CSN: [id number] Arrival date & time: 03/06/22  1305     History {Add pertinent medical, surgical, social history, OB history to HPI:1} Chief Complaint  Patient presents with   Urinary Frequency    [name] is a 76 y.o. male.  HPI    Patient comes in with chief complaint of altered mental status and increased urinary frequency.  Patient has history of B-cell lymphoma and prostatitis.  He was recently seen in the hospital for altered mental status.  He was diagnosed with UTI and started on antibiotics.  Patient's spouse indicates that patient improved with antibiotics.  However he finished the course on Wednesday and started having confusion again and some urinary frequency.  Patient has no specific complaints.  Confusion is described as inability to carry on conversation or giving incoherent response.   Review of system is positive for some back pain, on urinary frequency.  No nausea, vomiting, fevers, chills.  Patient has no focal weakness, numbness, vision change.  He has no new pain meds.  Home Medications Prior to Admission medications   Medication Sig Start Date End Date Taking? Authorizing Provider  acetaminophen (TYLENOL) 500 MG tablet Take 1,000 mg by mouth daily as needed for moderate pain.    [provider]  albuterol (VENTOLIN HFA) 108 (90 Base) MCG/ACT inhaler Inhale 2 puffs into the lungs every 6 (six) hours as needed for wheezing or shortness of breath.    [provider]  alfuzosin (UROXATRAL) 10 MG 24 hr tablet TAKE 1 TABLET AT BEDTIME 03/19/21   McKenzie, Candee Furbish, MD  aspirin EC 81 MG tablet Take 81 mg by mouth daily.    [provider]  atorvastatin (LIPITOR) 80 MG tablet Take 1 tablet (80 mg total) by mouth daily. 08/18/21 11/16/21  Skeet Latch, MD  buPROPion (WELLBUTRIN XL) 300 MG 24 hr tablet TAKE 1 TABLET EVERY DAY WITH BREAKFAST 02/20/21   Plotnikov, Evie Lacks, MD   Calcium Citrate-Vitamin D (CALCIUM CITRATE + D PO) Take 1 tablet by mouth 2 (two) times daily. '1200mg'$  of Calcium and 1000units vitamin D3    [provider]  Cholecalciferol (VITAMIN D-3 PO) Take 2,000 Units by mouth 2 (two) times daily.     [provider]  clonazePAM (KLONOPIN) 1 MG tablet Take 1 tablet (1 mg total) by mouth every 6 (six) hours as needed. for anxiety 08/28/21   Biagio Borg, MD  denosumab (PROLIA) 60 MG/ML SOSY injection Inject 60 mg as directed every 6 (six) months. Last injection: Feb 2020 Next injection: August 2020 12/10/15   [provider]  diltiazem (CARDIZEM CD) 240 MG 24 hr capsule TAKE 1 CAPSULE EVERY DAY 03/23/21   Plotnikov, Evie Lacks, MD  doxycycline (VIBRA-TABS) 100 MG tablet Take 1 tablet (100 mg total) by mouth 2 (two) times daily. 01/25/22   Plotnikov, Evie Lacks, MD  DULoxetine (CYMBALTA) 60 MG capsule TAKE 1 CAPSULE TWICE DAILY 03/23/21   Plotnikov, Evie Lacks, MD  EPINEPHrine 0.3 mg/0.3 mL IJ SOAJ injection See admin instructions. 07/31/21   [provider]  ezetimibe (ZETIA) 10 MG tablet TAKE 1 TABLET EVERY DAY 08/10/21   Skeet Latch, MD  ferrous sulfate 325 (65 FE) MG tablet Take 325 mg by mouth 2 (two) times daily with a meal.     [provider]  hydrochlorothiazide (HYDRODIURIL) 12.5 MG tablet TAKE 1 TABLET (12.5 MG TOTAL) BY MOUTH DAILY. 06/23/21   Loel Dubonnet, NP  HYDROcodone-acetaminophen (NORCO) 7.5-325 MG tablet Take 1 tablet by mouth every 6 (six) hours as needed for severe pain. 09/30/20   Plotnikov, Evie Lacks, MD  isosorbide mononitrate (IMDUR) 30 MG 24 hr tablet TAKE 1 TABLET (30 MG TOTAL) BY MOUTH DAILY. 08/10/21   Skeet Latch, MD  levothyroxine (SYNTHROID) 200 MCG tablet Take 100 mcg every other day alternating with 200 mcg every other day 01/27/22   Plotnikov, Evie Lacks, MD  loratadine (CLARITIN) 10 MG tablet Take 10 mg by mouth daily.     [provider]  losartan (COZAAR) 50 MG  tablet TAKE 1 TABLET EVERY DAY 06/18/21   Skeet Latch, MD  Magnesium 500 MG CAPS Take 1 capsule (500 mg total) by mouth daily. Patient taking differently: Take 500 mg by mouth every evening. 10/13/18   Brunetta Genera, MD  metoprolol tartrate (LOPRESSOR) 25 MG tablet Take 1 tablet (25 mg total) by mouth 2 (two) times daily. 08/18/21   Skeet Latch, MD  Multiple Vitamin (MULTIVITAMIN WITH MINERALS) TABS tablet Take 1 tablet by mouth daily. Men's One-A-Day 50+    [provider]  mupirocin ointment (BACTROBAN) 2 % On leg wound w/dressing change qd or bid 01/25/22   Plotnikov, Evie Lacks, MD  MYRBETRIQ 25 MG TB24 tablet TAKE 1 TABLET EVERY DAY 10/06/21   McKenzie, Candee Furbish, MD  nitroGLYCERIN (NITROSTAT) 0.4 MG SL tablet Place 0.4 mg under the tongue every 5 (five) minutes as needed for chest pain. x3 doses as needed for chest pain    [provider]  omeprazole (PRILOSEC) 40 MG capsule TAKE 1 CAPSULE EVERY DAY BEFORE BREAKFAST 06/18/21   Plotnikov, Evie Lacks, MD  Potassium Citrate 15 MEQ (1620 MG) TBCR TAKE 1 TABLET TWICE DAILY 03/19/21   McKenzie, Candee Furbish, MD  Probiotic Product (PROBIOTIC PO) Take 1 capsule by mouth daily with breakfast.    [provider]  Risankizumab-rzaa (SKYRIZI PEN [place]) Inject 150 mg into the skin every 3 (three) months.    [provider]  rivaroxaban (XARELTO) 10 MG TABS tablet Take 1 tablet (10 mg total) by mouth daily. 06/29/21   Plotnikov, Evie Lacks, MD  solifenacin (VESICARE) 10 MG tablet Take 10 mg by mouth daily. 08/24/21   [provider]  testosterone enanthate (DELATESTRYL) 200 MG/ML injection INJECT 1ML ('200MG'$ ) INTO THE MUSCLE EVERY 14 DAYS. DISCARD VIAL AFTER 28 DAYS. 09/22/21   Plotnikov, Evie Lacks, MD  tiZANidine (ZANAFLEX) 4 MG tablet TAKE 1 TABLET EVERY 6 HOURS AS NEEDED FOR MUSCLE SPASM(S) 06/29/21   Plotnikov, Evie Lacks, MD  vitamin C (ASCORBIC ACID) 500 MG tablet Take 500 mg by mouth daily.    [provider]      Allergies    Short ragweed pollen ext    Review of Systems   Review of Systems  Physical Exam Updated Vital Signs BP 115/62   Pulse 88   Temp 98 F (36.7 C) (Oral)   Resp 13   Ht '6\' 4"'$  (1.93 m)   Wt 122.9 kg   SpO2 96%   BMI 32.99 kg/m  Physical Exam Vitals and nursing note reviewed.  Constitutional:      Appearance: He is well-developed.  HENT:     Head: Atraumatic.  Eyes:     Extraocular Movements: Extraocular movements intact.     Pupils: Pupils are equal, round, and reactive to light.     Comments: No nystagmus  Cardiovascular:     Rate and Rhythm: Normal rate.  Pulmonary:  Effort: Pulmonary effort is normal.  Musculoskeletal:     Cervical back: Neck supple.  Skin:    General: Skin is warm.  Neurological:     Mental Status: He is alert and oriented to person, place, and time.     Cranial Nerves: No cranial nerve deficit.     Sensory: No sensory deficit.     Motor: No weakness.     Coordination: Coordination normal.     Comments: Patient has bilateral knee replacement, unable to elicit patellar reflex.  Patient has no rigidity over his extremities    ED Results / Procedures / Treatments   Labs (all labs ordered are listed, but only abnormal results are displayed) Labs Reviewed  URINALYSIS, ROUTINE W REFLEX MICROSCOPIC - Abnormal; Notable for the following components:      Result Value   APPearance HAZY (*)    Protein, ur TRACE (*)    Leukocytes,Ua LARGE (*)    WBC, UA >50 (*)    Bacteria, UA RARE (*)    All other components within normal limits  COMPREHENSIVE METABOLIC PANEL - Abnormal; Notable for the following components:   Chloride 95 (*)    BUN 28 (*)    Creatinine, Ser 1.63 (*)    Total Bilirubin 1.4 (*)    GFR, Estimated 43 (*)    All other components within normal limits  CBC WITH DIFFERENTIAL/PLATELET - Abnormal; Notable for the following components:   Hemoglobin 17.1 (*)    RDW 15.8 (*)    All other components  within normal limits  APTT - Abnormal; Notable for the following components:   aPTT 37 (*)    All other components within normal limits  CULTURE, BLOOD (ROUTINE X 2)  CULTURE, BLOOD (ROUTINE X 2)  URINE CULTURE  LACTIC ACID, PLASMA  PROTIME-INR  LACTIC ACID, PLASMA    EKG None  Radiology DG Chest Port 1 View  Result Date: 03/06/2022 CLINICAL DATA:  Possible sepsis. EXAM: PORTABLE CHEST 1 VIEW COMPARISON:  10/13/2021. FINDINGS: Cardiac silhouette is normal in size. No mediastinal or hilar masses. Right anterior chest wall Port-A-Cath is stable, tip in the mid superior vena cava. Chronic prominence of the interstitial markings bilaterally. Lungs otherwise clear. No pleural effusion or pneumothorax. Skeletal structures are grossly intact. IMPRESSION: No active disease. Electronically Signed   By: Lajean Manes M.D.   On: 03/06/2022 15:04    Procedures Procedures  {Document cardiac monitor, telemetry assessment procedure when appropriate:1}  Medications Ordered in ED Medications  cefTRIAXone (ROCEPHIN) 1 g in sodium chloride 0.9 % 100 mL IVPB (1 g Intravenous New Bag/Given 03/06/22 1534)  iohexol (OMNIPAQUE) 300 MG/ML solution 100 mL (has no administration in time range)    ED Course/ Medical Decision Making/ A&P Clinical Course as of 03/06/22 1548  Sat Mar 06, 2022  1546 Urinalysis, Routine w reflex microscopic(!) UA has positive markers for UTI.  Discussed case with Dr. Irene Limbo, oncology.  Indicates that patient had proved with antibiotic when he saw him in the clinic.  He states that if there is lack of clarity as to to the cause of altered mental status, then MRI can be considered for the patient.  If patient is admitted, they will see the patient as they might want to also get a biopsy. [AN]  1548 Urinalysis, Routine w reflex microscopic(!) Given that patient is UTI was pansensitive, questioning if patient has some perinephric abscess/pyelonephritis or even prostatitis.  We will  get CT abdomen and pelvis with contrast. [AN]  Clinical Course User Index [AN] Varney Biles, MD                           Medical Decision Making Amount and/or Complexity of Data Reviewed Labs: ordered. Radiology: ordered. ECG/medicine tests: ordered.   This patient presents to the ED with chief complaint(s) of altered mental status and urinary frequency with pertinent past medical history of relapse of B-cell lymphoma, prostatitis and recent treatment for UTI which further complicates the presenting complaint. The complaint involves an extensive differential diagnosis and also carries with it a high risk of complications and morbidity.    The differential diagnosis includes  Pyelonephritis, cystitis, encephalopathy due to cancer, medication side effects, brain mass.  The initial plan is to order basic blood work-up including urine analysis.   Additional history obtained: Additional history obtained from spouse Records reviewed previous admission documents and previous CT scan, which is negative for mass.  Independent labs interpretation:  The following labs were independently interpreted: UA that is positive for markers of infection.  Independent visualization of imaging: - I independently visualized the following imaging with scope of interpretation limited to determining acute life threatening conditions related to emergency care: ***, which revealed ***  Treatment and Reassessment: ***  Consultation: - Consulted or discussed management/test interpretation w/ external professional: ***  Consideration for admission or further workup: Social Determinants of health:   Final Clinical Impression(s) / ED Diagnoses Final diagnoses:  None    Rx / DC Orders ED Discharge Orders     None

## 2022-03-06 NOTE — Assessment & Plan Note (Addendum)
Recently recurrent with skin lesion on buttock. Followed by oncology. 1. Getting MRI brain to r/o CNS lymphoma given ongoing acute metabolic encephalopathy. 2. Wound care consult

## 2022-03-06 NOTE — Assessment & Plan Note (Signed)
Not sure why he keeps getting recurrent UTIs.  Though it looks like this has been ongoing for atleast 10 years from what I can tell, and definitely was ongoing prior to development of B-cell lymphoma in 2019, so dont think its related to his recurrent lymphoma.

## 2022-03-06 NOTE — Assessment & Plan Note (Addendum)
Either a primary lung neoplasm or lymphoma involvement of the lung. 1. May want to call oncologist in AM and see if they want [place] to get this biopsied while patient is here?

## 2022-03-06 NOTE — Progress Notes (Signed)
Plan of Care Note for accepted transfer   Patient: [name] MRN: [id number]   Stout: 03/06/2022  Facility requesting transfer: [facility] Requesting Provider: EDP Reason for transfer: altered mental status Facility course:    76 yr male with h/o medically managed moderate to severe CAD, long-standing-persistent atrial fibrillation on Xarelto, chronic diastolic heart failure, hypertension, hyperlipidemia, sleep apnea on CPAP, past h/o diffuse large B cell lymphoma with concerns of recent recurrence under evaluation, presents to [facility] with altered mental status, was seen in the ED for the same on 5/17, was diagnosed with E. coli UTI, was discharged home on antibiotics, however confusion did not improve, return to the ED today.  Blood pressure 136/80, pulse 89, temperature 98 F (36.7 C), temperature source Oral, resp. rate 15, height '6\' 4"'$  (1.93 m), weight 122.9 kg, SpO2 95 %.  EDP talked to oncology who recommend MRI of the brain to rule out CNS lymphoma  Repeat UA continue to concern of UTI, urine culture and blood culture obtained, received Rocephin, accepted to med telemetry   Plan of care: The patient is accepted for admission to Telemetry unit, at [facility]    Author: Florencia Reasons, MD PhD FACP 03/06/2022  Check [email or web address] for on-call coverage.  Nursing staff, Please call [place] number on Amion as soon as patient's arrival, so appropriate admitting provider can evaluate the pt.

## 2022-03-06 NOTE — Assessment & Plan Note (Addendum)
UA today suggestive of recurrent UTI despite recent ABx treatment as outpt with bactrim and pan-sensitive e.coli culture 10 days ago. 1. Empiric rocephin 2. Repeat culture pending 3. CT AP = nephrolithiasis but no obstructing stones, renal abscesses, etc. 1. Extensive history of obstructing stones in past requiring intervention though, seen extensively by [facility] in past. 4. Looks like hes had trouble with recurrent UTIs in past, had to be on chronic ABx in past too it looks like (see Dr. Judeen Hammans overview above this note).

## 2022-03-06 NOTE — ED Notes (Signed)
Pt attempted to provide a urine sample, but unable to go at this time. Pt urinated, prior to coming to the ED. Pt will try later. Informed Zenia Resides - RN.

## 2022-03-06 NOTE — Assessment & Plan Note (Addendum)
Confusion recurrent after treatment of recent UTI. 1. UA today suspicious for recurrent UTI, possibly cause of recurrent confusion. 2. Getting MRI brain to r/o CNS lymphoma 3. See below

## 2022-03-06 NOTE — H&P (Signed)
History and Physical    Patient: [name] XBD:[id number] DOB: 1946-02-12 DOA: 03/06/2022 DOS: the patient was seen and examined on 03/06/2022 PCP: Plotnikov, Evie Lacks, MD  Patient coming from: Home  Chief Complaint:  Chief Complaint  Patient presents with   Urinary Frequency   HPI: [name] is a 76 y.o. male with medical history significant of CAD, diastolic CHF, A.Fib on Xarelto, DLBCL with recent recurrence in the form of a skin lesion on buttock recently saw oncology (see office note from 5/18).  Prior recurrent UTIs requring chronic ABx previously.  Pt with recent confusion / AMS, seen in ED on 5/17; diagnosed with UTI, put on bactrim and discharged home.  Culture grew out pan-sensitive E.coli.  CT head at that time was neg for acute findings.  Confusion persistent / recurrent, back to ED today.  EDP spoke with onc who recd MRI brain to r/o CNS lymphoma.  Repeat UA has again showed suspicious for UTI.  Started on rocephin.  Review of Systems: As mentioned in the history of present illness. All other systems reviewed and are negative. Past Medical History:  Diagnosis Date   Anticoagulant long-term use    xarelto--- takes for AFib,  managed by pcp, Dr Alain Marion   Benign localized prostatic hyperplasia with lower urinary tract symptoms (LUTS)    CAD (coronary artery disease) cardiologist--  dr t. Oval Linsey   hx of known CAD obstruction w/ collaterals (cath done @ [facility] 12-31-2011) ;   last cardiac cath 08-13-2019  showed sig. 2V CAD involving proxLAD and CTO of the RCA/  chronic total occlusion midRCA w/ bridging and L>R collaterals   Diastolic CHF ([place])    dx [id number] hospital admission (followed by pcp)   Diffuse large B cell lymphoma [facility]) oncologist-- dr Irene Limbo--- in remission   dx 08/ 2019 -- bx left tonsill mass-- involving lymph nodes--- completed chemotherapy 10-12-2018   DJD (degenerative joint disease)    Dysthymic disorder    Elevated brain  natriuretic peptide (BNP) level    Environmental allergies    GAD (generalized anxiety disorder)    GERD (gastroesophageal reflux disease)    History of falling    multiple times   History of Graves' disease    1987 s/p total thyroidectomy   History of kidney stones    History of scabies    07/ 2014  resolved   History of syncope    per D/C note in epic 02-21-2013 ?vasovagal   History of vertebral compression fracture    05/ 2014 --  L1, no surgical intervention   HLD (hyperlipidemia)    Hyperlipidemia    Hypertension    Hypogonadism in male    IDA (iron deficiency anemia)    Lumbago    OA (osteoarthritis)    OSA on CPAP    cpap machine-settings 17   Osteoporosis    Severe   Persistent atrial fibrillation [facility])    cardiologist--- dr Alfonso Patten. [place]   Plaque psoriasis    dermatologist--- dr Harriett Sine--- currently taking otezla   Post-surgical hypothyroidism    followed by pcp---  s/p total thyroidectomy for graves disease in 1987   Pure hypercholesterolemia 02/10/2021   Renal calculus, right    Thrombocytopenia ([place])    Unsteady gait    uses walker   Wears hearing aid in both ears    Past Surgical History:  Procedure Laterality Date   APPENDECTOMY  2011 approx.    CARDIAC CATHETERIZATION  12-31-2011   dr  j. mcgukin '@[facility]'$    normal LVF w/ multivessel CAD,  occluded RCA w/ colleterals   CATARACT EXTRACTION W/ INTRAOCULAR LENS  IMPLANT, BILATERAL  2016 approx.   COLONOSCOPY     CYSTOSCOPY WITH RETROGRADE PYELOGRAM, URETEROSCOPY AND STENT PLACEMENT Left 11/07/2017   Procedure: CYSTOSCOPY WITH RETROGRADE PYELOGRAM, URETEROSCOPY AND STENT PLACEMENT;  Surgeon: Cleon Gustin, MD;  Location: [facility];  Service: Urology;  Laterality: Left;   CYSTOSCOPY WITH RETROGRADE PYELOGRAM, URETEROSCOPY AND STENT PLACEMENT Right 10/22/2019   Procedure: CYSTOSCOPY WITH RETROGRADE PYELOGRAM, URETEROSCOPY AND STENT PLACEMENT;  Surgeon: Cleon Gustin, MD;  Location: [facility];  Service: Urology;  Laterality: Right;   CYSTOSCOPY/URETEROSCOPY/HOLMIUM LASER/STENT PLACEMENT Right 03/11/2017   Procedure: CYSTOSCOPY/URETEROSCOPYSTENT PLACEMENT right ureter retrograde pylegram;  Surgeon: Cleon Gustin, MD;  Location: [facility];  Service: Urology;  Laterality: Right;   HARDWARE REMOVAL  10/05/2012   Procedure: HARDWARE REMOVAL;  Surgeon: [name], MD;  Location: [facility];  Service: Orthopedics;  Laterality: Right;  REMOVING  STRYKER  GAMMA NAIL   HARDWARE REMOVAL Right 07/03/2013   Procedure: HARDWARE REMOVAL RIGHT TIBIA ;  Surgeon: Rozanna Box, MD;  Location: [place];  Service: Orthopedics;  Laterality: Right;   HIP CLOSED REDUCTION Right 10/15/2013   Procedure: CLOSED MANIPULATION HIP;  Surgeon: [name], MD;  Location: [facility];  Service: Orthopedics;  Laterality: Right;   HOLMIUM LASER APPLICATION Right 05/12/7077   Procedure: HOLMIUM LASER APPLICATION;  Surgeon: Cleon Gustin, MD;  Location[facility];  Service: Urology;  Laterality: Right;   HOLMIUM LASER APPLICATION Left 6/75/4492   Procedure: HOLMIUM LASER APPLICATION;  Surgeon: Cleon Gustin, MD;  [facility];  Service: Urology;  Laterality: Left;   HOLMIUM LASER APPLICATION Left 0/10/69   Procedure: HOLMIUM LASER APPLICATION;  Surgeon: Cleon Gustin, MD;  [facility];  Service: Urology;  Laterality: Left;   HOLMIUM LASER APPLICATION Right 11/29/7586   Procedure: HOLMIUM LASER APPLICATION;  Surgeon: Cleon Gustin, MD;  Location: [facility];  Service: Urology;  Laterality: Right;   INCISION AND DRAINAGE HIP Right 11/16/2013   Procedure: IRRIGATION AND DEBRIDEMENT RIGHT HIP;  Surgeon: [name], MD;  [facility];  Service: Orthopedics;  Laterality: Right;   INTRAVASCULAR PRESSURE WIRE/FFR STUDY N/A 08/13/2019   Procedure: INTRAVASCULAR PRESSURE WIRE/FFR STUDY;  Surgeon: Nelva Bush, MD;  Location: [place] [facility];  Service: Cardiovascular;   Laterality: N/A;   IR IMAGING GUIDED PORT INSERTION  06/22/2018   IR NEPHROSTOMY PLACEMENT LEFT  03/28/2019   IR URETERAL STENT LEFT NEW ACCESS W/O SEP NEPHROSTOMY CATH  10/24/2017   IR URETERAL STENT LEFT NEW ACCESS W/O SEP NEPHROSTOMY CATH  03/26/2019   NEPHROLITHOTOMY Right 02/08/2017   Procedure: NEPHROLITHOTOMY PERCUTANEOUS WITH SURGEON ACCESS;  Surgeon: Cleon Gustin, MD;  [facility];  Service: Urology;  Laterality: Right;   NEPHROLITHOTOMY Left 10/24/2017   Procedure: NEPHROLITHOTOMY PERCUTANEOUS;  Surgeon: Cleon Gustin, MD;  [facility];  Service: Urology;  Laterality: Left;   NEPHROLITHOTOMY Left 03/26/2019   Procedure: NEPHROLITHOTOMY PERCUTANEOUS;  Surgeon: Cleon Gustin, MD;  [facility];  Service: Urology;  Laterality: Left;  2 HRS   NEPHROLITHOTOMY Left 04/17/2019   Procedure: NEPHROLITHOTOMY PERCUTANEOUS;  Surgeon: Cleon Gustin, MD;  [facility];  Service: Urology;  Laterality: Left;  2 HRS   ORIF TIBIA FRACTURE Right 02/06/2013   Procedure: OPEN REDUCTION INTERNAL FIXATION (ORIF) TIBIA FRACTURE WITH IM ROD  FIBULA;  Surgeon: Rozanna Box, MD;  Location: [place];  Service: Orthopedics;  Laterality: Right;   ORIF TIBIA FRACTURE Right 07/03/2013   Procedure: RIGHT TIBIA NON UNION REPAIR ;  Surgeon: Rozanna Box, MD;  Location: [place];  Service: Orthopedics;  Laterality: Right;   ORIF WRIST FRACTURE  10/02/2012   Procedure: OPEN REDUCTION INTERNAL FIXATION (ORIF) WRIST FRACTURE;  Surgeon: Roseanne Kaufman, MD;  [facility];  Service: Orthopedics;  Laterality: Right;  WITH   ANTIBIOTIC  CEMENT   ORIF WRIST FRACTURE Left 10/28/2013   Procedure: OPEN REDUCTION INTERNAL FIXATION (ORIF) WRIST FRACTURE with allograft;  Surgeon: Roseanne Kaufman, MD;  Location[facility];  Service: Orthopedics;  Laterality: Left;  DVR Plate   QUADRICEPS TENDON REPAIR Left 07/15/2017   Procedure: REPAIR QUADRICEP TENDON;  Surgeon: Frederik Pear, MD;  Location: [place];  Service:  Orthopedics;  Laterality: Left;   RIGHT/LEFT HEART CATH AND CORONARY ANGIOGRAPHY N/A 08/13/2019   Procedure: RIGHT/LEFT HEART CATH AND CORONARY ANGIOGRAPHY;  Surgeon: Nelva Bush, MD;  Location: [place] [facility];  Service: Cardiovascular;  Laterality: N/A;   THYROIDECTOMY  02/1986   TOTAL HIP ARTHROPLASTY Right 03-16-2016   '@[facility]'$    TOTAL HIP REVISION  10/05/2012   Procedure: TOTAL HIP REVISION;  Surgeon: [name], MD;  [facility];  Service: Orthopedics;  Laterality: Right;  RIGHT TOTAL HIP REVISION   TOTAL HIP REVISION Right 09/17/2013   Procedure: REVISION RIGHT TOTAL HIP ARTHROPLASTY ;  Surgeon: [name], MD;  [facility];  Service: Orthopedics;  Laterality: Right;   TOTAL HIP REVISION Right 10/26/2013   Procedure: REVISION RIGHT TOTAL HIP ARTHROPLASTY;  Surgeon: [name], MD;  [facility];  Service: Orthopedics;  Laterality: Right;   TOTAL KNEE ARTHROPLASTY Bilateral right 03-15-2011;  left 06-30-2011   TOTAL KNEE REVISION Left 04/11/2017   Procedure: TOTAL KNEE REVISION PATELLA and TIBIA;  Surgeon: Frederik Pear, MD;  Location: [place];  Service: Orthopedics;  Laterality: Left;   Social History:  reports that he quit smoking about 10 years ago. His smoking use included cigarettes. He has a 50.00 pack-year smoking history. He has never used smokeless tobacco. He reports that he does not currently use alcohol. He reports that he does not use drugs.  Allergies  Allergen Reactions   Short Ragweed Pollen Ext Cough    Family History  Problem Relation Age of Onset   CAD Father 84   Asthma Father    Alcohol abuse Father    Arthritis Mother    Alcohol abuse Sister     Prior to Admission medications   Medication Sig Start Date End Date Taking? Authorizing Provider  acetaminophen (TYLENOL) 500 MG tablet Take 1,000 mg by mouth daily as needed for moderate pain.   Yes [provider]  albuterol (VENTOLIN HFA) 108 (90 Base) MCG/ACT inhaler Inhale 2  puffs into the lungs every 6 (six) hours as needed for wheezing or shortness of breath.    [provider]  alfuzosin (UROXATRAL) 10 MG 24 hr tablet TAKE 1 TABLET AT BEDTIME 03/19/21   McKenzie, Candee Furbish, MD  aspirin EC 81 MG tablet Take 81 mg by mouth daily.    [provider]  atorvastatin (LIPITOR) 80 MG tablet Take 1 tablet (80 mg total) by mouth daily. 08/18/21 11/16/21  Skeet Latch, MD  buPROPion (WELLBUTRIN XL) 300 MG 24 hr tablet TAKE 1 TABLET EVERY DAY WITH BREAKFAST 02/20/21   Plotnikov, Evie Lacks, MD  Calcium Citrate-Vitamin D (CALCIUM CITRATE + D PO) Take 1 tablet by mouth 2 (two) times daily. '1200mg'$  of Calcium and 1000units vitamin D3    [provider]  Cholecalciferol (VITAMIN D-3 PO) Take 2,000 Units by mouth 2 (two) times daily.     [provider]  clonazePAM (KLONOPIN) 1 MG tablet Take 1 tablet (1 mg total) by mouth every 6 (six) hours as needed. for anxiety 08/28/21   Biagio Borg, MD  denosumab (PROLIA) 60 MG/ML SOSY injection Inject 60 mg as directed every 6 (six) months. Last injection: Feb 2020 Next injection: August 2020 12/10/15   [provider]  diltiazem (CARDIZEM CD) 240 MG 24 hr capsule TAKE 1 CAPSULE EVERY DAY 03/23/21   Plotnikov, Evie Lacks, MD  doxycycline (VIBRA-TABS) 100 MG tablet Take 1 tablet (100 mg total) by mouth 2 (two) times daily. 01/25/22   Plotnikov, Evie Lacks, MD  DULoxetine (CYMBALTA) 60 MG capsule TAKE 1 CAPSULE TWICE DAILY 03/23/21   Plotnikov, Evie Lacks, MD  EPINEPHrine 0.3 mg/0.3 mL IJ SOAJ injection See admin instructions. 07/31/21   [provider]  ezetimibe (ZETIA) 10 MG tablet TAKE 1 TABLET EVERY DAY 08/10/21   Skeet Latch, MD  ferrous sulfate 325 (65 FE) MG tablet Take 325 mg by mouth 2 (two) times daily with a meal.     [provider]  hydrochlorothiazide (HYDRODIURIL) 12.5 MG tablet TAKE 1 TABLET (12.5 MG TOTAL) BY MOUTH DAILY. 06/23/21   Loel Dubonnet, NP   HYDROcodone-acetaminophen (NORCO) 7.5-325 MG tablet Take 1 tablet by mouth every 6 (six) hours as needed for severe pain. 09/30/20   Plotnikov, Evie Lacks, MD  isosorbide mononitrate (IMDUR) 30 MG 24 hr tablet TAKE 1 TABLET (30 MG TOTAL) BY MOUTH DAILY. 08/10/21   Skeet Latch, MD  levothyroxine (SYNTHROID) 200 MCG tablet Take 100 mcg every other day alternating with 200 mcg every other day 01/27/22   Plotnikov, Evie Lacks, MD  loratadine (CLARITIN) 10 MG tablet Take 10 mg by mouth daily.     [provider]  losartan (COZAAR) 50 MG tablet TAKE 1 TABLET EVERY DAY 06/18/21   Skeet Latch, MD  Magnesium 500 MG CAPS Take 1 capsule (500 mg total) by mouth daily. Patient taking differently: Take 500 mg by mouth every evening. 10/13/18   Brunetta Genera, MD  metoprolol tartrate (LOPRESSOR) 25 MG tablet Take 1 tablet (25 mg total) by mouth 2 (two) times daily. 08/18/21   Skeet Latch, MD  Multiple Vitamin (MULTIVITAMIN WITH MINERALS) TABS tablet Take 1 tablet by mouth daily. Men's One-A-Day 50+    [provider]  mupirocin ointment (BACTROBAN) 2 % On leg wound w/dressing change qd or bid 01/25/22   Plotnikov, Evie Lacks, MD  MYRBETRIQ 25 MG TB24 tablet TAKE 1 TABLET EVERY DAY 10/06/21   McKenzie, Candee Furbish, MD  nitroGLYCERIN (NITROSTAT) 0.4 MG SL tablet Place 0.4 mg under the tongue every 5 (five) minutes as needed for chest pain. x3 doses as needed for chest pain    [provider]  omeprazole (PRILOSEC) 40 MG capsule TAKE 1 CAPSULE EVERY DAY BEFORE BREAKFAST 06/18/21   Plotnikov, Evie Lacks, MD  Potassium Citrate 15 MEQ (1620 MG) TBCR TAKE 1 TABLET TWICE DAILY 03/19/21   McKenzie, Candee Furbish, MD  Probiotic Product (PROBIOTIC PO) Take 1 capsule by mouth daily with breakfast.    [provider]  Risankizumab-rzaa (SKYRIZI PEN ) Inject 150 mg into the skin every 3 (three) months.  [provider]  rivaroxaban (XARELTO) 10 MG TABS tablet Take 1 tablet  (10 mg total) by mouth daily. 06/29/21   Plotnikov, Evie Lacks, MD  SKYRIZI PEN 150 MG/ML SOAJ Inject into the skin. 01/22/22   [provider]  solifenacin (VESICARE) 10 MG tablet Take 10 mg by mouth daily. 08/24/21   [provider]  sulfamethoxazole-trimethoprim (BACTRIM) 400-80 MG tablet Take 1 tablet by mouth 2 (two) times daily. 02/24/22   [provider]  testosterone enanthate (DELATESTRYL) 200 MG/ML injection INJECT 1ML ('200MG'$ ) INTO THE MUSCLE EVERY 14 DAYS. DISCARD VIAL AFTER 28 DAYS. 09/22/21   Plotnikov, Evie Lacks, MD  tiZANidine (ZANAFLEX) 4 MG tablet TAKE 1 TABLET EVERY 6 HOURS AS NEEDED FOR MUSCLE SPASM(S) 06/29/21   Plotnikov, Evie Lacks, MD  vitamin C (ASCORBIC ACID) 500 MG tablet Take 500 mg by mouth daily.    [provider]    Physical Exam: Vitals:   03/06/22 1630 03/06/22 1730 03/06/22 1930 03/06/22 2032  BP: 135/67 136/80 138/83 126/81  Pulse: 83 89 80 91  Resp: (!) 22 15 (!) 22 20  Temp:   98.6 F (37 C) 98.7 F (37.1 C)  TempSrc:   Oral Oral  SpO2: 93% 95% 96% 99%  Weight:      Height:       Constitutional: NAD, calm, comfortable Eyes: PERRL, lids and conjunctivae normal ENMT: Mucous membranes are moist. Posterior pharynx clear of any exudate or lesions.Normal dentition.  Neck: normal, supple, no masses, no thyromegaly Respiratory: clear to auscultation bilaterally, no wheezing, no crackles. Normal respiratory effort. No accessory muscle use.  Cardiovascular: Regular rate and rhythm, no murmurs / rubs / gallops. No extremity edema. 2+ pedal pulses. No carotid bruits.  Abdomen: no tenderness, no masses palpated. No hepatosplenomegaly. Bowel sounds positive.  Musculoskeletal: no clubbing / cyanosis. No joint deformity upper and lower extremities. Good ROM, no contractures. Normal muscle tone.  Skin: Ulcer on L buttocks with serous drainage Neurologic: CN 2-12 grossly intact. Sensation intact, DTR normal. Strength 5/5 in all 4.   Psychiatric: Mild confusion  Data Reviewed:    CBC    Latest Ref Rng & Units 03/06/2022    2:46 PM 02/24/2022    3:02 PM 01/25/2022    2:00 PM  CMP  Glucose 70 - 99 mg/dL 85   106   92    BUN 8 - 23 mg/dL 28   37   27    Creatinine 0.61 - 1.24 mg/dL 1.63   1.61   1.34    Sodium 135 - 145 mmol/L 135   135   138    Potassium 3.5 - 5.1 mmol/L 3.7   4.9   4.2    Chloride 98 - 111 mmol/L 95   98   100    CO2 22 - 32 mmol/L '28   28   28    '$ Calcium 8.9 - 10.3 mg/dL 9.5   10.1   9.6    Total Protein 6.5 - 8.1 g/dL 7.1   7.1   6.7    Total Bilirubin 0.3 - 1.2 mg/dL 1.4   1.5   1.3    Alkaline Phos 38 - 126 U/L 71   73   58    AST 15 - 41 U/L 36   30   29    ALT 0 - 44 U/L '28   28   29     '$ Urinalysis    Component Value Date/Time  COLORURINE YELLOW 03/06/2022 1320   APPEARANCEUR HAZY (A) 03/06/2022 1320   LABSPEC 1.015 03/06/2022 1320   PHURINE 7.0 03/06/2022 1320   GLUCOSEU NEGATIVE 03/06/2022 1320   GLUCOSEU NEGATIVE 10/02/2018 1235   HGBUR NEGATIVE 03/06/2022 1320   BILIRUBINUR NEGATIVE 03/06/2022 1320   KETONESUR NEGATIVE 03/06/2022 1320   PROTEINUR TRACE (A) 03/06/2022 1320   UROBILINOGEN 0.2 10/02/2018 1235   NITRITE NEGATIVE 03/06/2022 1320   LEUKOCYTESUR LARGE (A) 03/06/2022 1320    CT AP: IMPRESSION: 1. No renal mass or abscess. 2. Small, nonobstructing calculus in each kidney. 3. Mild increase in size of the previously demonstrated right lower lobe hypermetabolic mass with an interval small satellite nodule posteriorly. This is concerning for a primary lung neoplasm or lymphoma involving the lung. 4. Recently demonstrated left pelvic and inguinal adenopathy, most likely due to the patient's known lymphoma. 5. Stable small to moderate-sized supraumbilical hernia containing herniated fat. 6. Stable right adrenal probable adenoma.  Assessment and Plan: * Altered mental state Confusion recurrent after treatment of recent UTI. UA today suspicious for recurrent  UTI, possibly cause of recurrent confusion. Getting MRI brain to r/o CNS lymphoma See below  UTI (urinary tract infection) UA today suggestive of recurrent UTI despite recent ABx treatment as outpt with bactrim and pan-sensitive e.coli culture 10 days ago. Empiric rocephin Repeat culture pending CT AP = nephrolithiasis but no obstructing stones, renal abscesses, etc. Extensive history of obstructing stones in past requiring intervention though, seen extensively by [facility] in past. Looks like hes had trouble with recurrent UTIs in past, had to be on chronic ABx in past too it looks like (see Dr. Judeen Hammans overview above this note).  Diffuse large B-cell lymphoma ([place]) Recently recurrent with skin lesion on buttock. Followed by oncology. Getting MRI brain to r/o CNS lymphoma given ongoing acute metabolic encephalopathy. Wound care consult  Lung mass Either a primary lung neoplasm or lymphoma involvement of the lung. May want to call oncologist in AM and see if they want [place] to get this biopsied while patient is here?  History of recurrent UTIs Not sure why he keeps getting recurrent UTIs.  Though it looks like this has been ongoing for atleast 10 years from what I can tell, and definitely was ongoing prior to development of B-cell lymphoma in 2019, so dont think its related to his recurrent lymphoma.  Paroxysmal atrial fibrillation ([place]) Home med rec pending: Cont home metoprolol, cardizem Cont home Xarelto  CKD (chronic kidney disease) stage 3, GFR 30-59 ml/min (H[facility]) Not previously documented but looks like baseline creat is about 1.4-1.5 in 2022. 1.6 on 02/24/22 and again today.  Essential hypertension Home med rec pending still.  Chronic diastolic heart failure ([place]) Med rec pending Hold diuretics for the moment.  Hypothyroidism Med rec pending. TSH last week is elevated, though T4 is WNL Doubt myxedema given nl T4 May want to increase synthroid dose when med  rec results.      Advance Care Planning:   Code Status: Full Code  Consults: EDP spoke with oncology  Family Communication: No family in room  Severity of Illness: The appropriate patient status for this patient is OBSERVATION. Observation status is judged to be reasonable and necessary in order to provide the required intensity of service to ensure the patient's safety. The patient's presenting symptoms, physical exam findings, and initial radiographic and laboratory data in the context of their medical condition is felt to place them at decreased risk for further clinical deterioration. Furthermore,  it is anticipated that the patient will be medically stable for discharge from the hospital within 2 midnights of admission.   Author: Etta Quill., DO 03/06/2022 9:17 PM  For on call review [email or web address].

## 2022-03-07 ENCOUNTER — Encounter (HOSPITAL_COMMUNITY): Payer: Self-pay | Admitting: Internal Medicine

## 2022-03-07 ENCOUNTER — Inpatient Hospital Stay (HOSPITAL_COMMUNITY): Payer: Medicare PPO

## 2022-03-07 DIAGNOSIS — Z87440 Personal history of urinary (tract) infections: Secondary | ICD-10-CM | POA: Diagnosis not present

## 2022-03-07 DIAGNOSIS — G9341 Metabolic encephalopathy: Secondary | ICD-10-CM

## 2022-03-07 DIAGNOSIS — R4182 Altered mental status, unspecified: Secondary | ICD-10-CM

## 2022-03-07 DIAGNOSIS — C8330 Diffuse large B-cell lymphoma, unspecified site: Secondary | ICD-10-CM

## 2022-03-07 DIAGNOSIS — I5032 Chronic diastolic (congestive) heart failure: Secondary | ICD-10-CM | POA: Diagnosis not present

## 2022-03-07 DIAGNOSIS — R918 Other nonspecific abnormal finding of lung field: Secondary | ICD-10-CM | POA: Diagnosis not present

## 2022-03-07 DIAGNOSIS — I4811 Longstanding persistent atrial fibrillation: Secondary | ICD-10-CM | POA: Diagnosis not present

## 2022-03-07 DIAGNOSIS — N1831 Chronic kidney disease, stage 3a: Secondary | ICD-10-CM | POA: Diagnosis not present

## 2022-03-07 LAB — CBC
HCT: 48.7 % (ref 39.0–52.0)
Hemoglobin: 16.6 g/dL (ref 13.0–17.0)
MCH: 31.9 pg (ref 26.0–34.0)
MCHC: 34.1 g/dL (ref 30.0–36.0)
MCV: 93.5 fL (ref 80.0–100.0)
Platelets: 152 10*3/uL (ref 150–400)
RBC: 5.21 MIL/uL (ref 4.22–5.81)
RDW: 15.8 % — ABNORMAL HIGH (ref 11.5–15.5)
WBC: 7.5 10*3/uL (ref 4.0–10.5)
nRBC: 0 % (ref 0.0–0.2)

## 2022-03-07 LAB — BASIC METABOLIC PANEL
Anion gap: 10 (ref 5–15)
BUN: 22 mg/dL (ref 8–23)
CO2: 25 mmol/L (ref 22–32)
Calcium: 8.6 mg/dL — ABNORMAL LOW (ref 8.9–10.3)
Chloride: 98 mmol/L (ref 98–111)
Creatinine, Ser: 1.38 mg/dL — ABNORMAL HIGH (ref 0.61–1.24)
GFR, Estimated: 53 mL/min — ABNORMAL LOW (ref >60)
Glucose, Bld: 82 mg/dL (ref 70–99)
Potassium: 3.7 mmol/L (ref 3.5–5.1)
Sodium: 133 mmol/L — ABNORMAL LOW (ref 135–145)

## 2022-03-07 MED ORDER — TIZANIDINE HCL 4 MG PO TABS: 4.0000 mg | ORAL_TABLET | Freq: Four times a day (QID) | ORAL | Status: DC | PRN

## 2022-03-07 MED ORDER — MIRABEGRON ER 25 MG PO TB24
25.0000 mg | ORAL_TABLET | Freq: Every day | ORAL | Status: DC
  Administered 2022-03-07 – 2022-03-08 (×2): 25 mg via ORAL
  Filled 2022-03-07 (×2): qty 1

## 2022-03-07 MED ORDER — ALBUTEROL SULFATE (2.5 MG/3ML) 0.083% IN NEBU: 3.0000 mL | INHALATION_SOLUTION | Freq: Four times a day (QID) | RESPIRATORY_TRACT | Status: DC | PRN

## 2022-03-07 MED ORDER — ISOSORBIDE MONONITRATE ER 30 MG PO TB24
30.0000 mg | ORAL_TABLET | Freq: Every day | ORAL | Status: DC
  Administered 2022-03-07 – 2022-03-08 (×2): 30 mg via ORAL
  Filled 2022-03-07 (×2): qty 1

## 2022-03-07 MED ORDER — ALFUZOSIN HCL ER 10 MG PO TB24
10.0000 mg | ORAL_TABLET | Freq: Every day | ORAL | Status: DC
  Administered 2022-03-07: 10 mg via ORAL
  Filled 2022-03-07: qty 1

## 2022-03-07 MED ORDER — SODIUM CHLORIDE 0.9 % IV SOLN
2.0000 g | INTRAVENOUS | Status: DC
  Administered 2022-03-07: 2 g via INTRAVENOUS
  Filled 2022-03-07: qty 20

## 2022-03-07 MED ORDER — MAGNESIUM OXIDE -MG SUPPLEMENT 400 (240 MG) MG PO TABS
400.0000 mg | ORAL_TABLET | Freq: Every evening | ORAL | Status: DC
  Administered 2022-03-07: 400 mg via ORAL
  Filled 2022-03-07: qty 1

## 2022-03-07 MED ORDER — EZETIMIBE 10 MG PO TABS
10.0000 mg | ORAL_TABLET | Freq: Every day | ORAL | Status: DC
  Administered 2022-03-07 – 2022-03-08 (×2): 10 mg via ORAL
  Filled 2022-03-07 (×2): qty 1

## 2022-03-07 MED ORDER — PANTOPRAZOLE SODIUM 40 MG PO TBEC
80.0000 mg | DELAYED_RELEASE_TABLET | Freq: Every day | ORAL | Status: DC
  Administered 2022-03-07 – 2022-03-08 (×2): 80 mg via ORAL
  Filled 2022-03-07 (×2): qty 2

## 2022-03-07 MED ORDER — LEVOTHYROXINE SODIUM 100 MCG PO TABS
100.0000 ug | ORAL_TABLET | Freq: Every day | ORAL | Status: DC
  Administered 2022-03-07 – 2022-03-08 (×2): 100 ug via ORAL
  Filled 2022-03-07 (×2): qty 1

## 2022-03-07 MED ORDER — LORATADINE 10 MG PO TABS
10.0000 mg | ORAL_TABLET | Freq: Every day | ORAL | Status: DC
  Administered 2022-03-07 – 2022-03-08 (×2): 10 mg via ORAL
  Filled 2022-03-07 (×2): qty 1

## 2022-03-07 MED ORDER — HYDROCODONE-ACETAMINOPHEN 7.5-325 MG PO TABS: 1.0000 | ORAL_TABLET | Freq: Four times a day (QID) | ORAL | Status: DC | PRN

## 2022-03-07 MED ORDER — FERROUS SULFATE 325 (65 FE) MG PO TABS
325.0000 mg | ORAL_TABLET | Freq: Two times a day (BID) | ORAL | Status: DC
  Administered 2022-03-07 – 2022-03-08 (×3): 325 mg via ORAL
  Filled 2022-03-07 (×3): qty 1

## 2022-03-07 MED ORDER — DULOXETINE HCL 30 MG PO CPEP
60.0000 mg | ORAL_CAPSULE | Freq: Two times a day (BID) | ORAL | Status: DC
  Administered 2022-03-07 – 2022-03-08 (×3): 60 mg via ORAL
  Filled 2022-03-07 (×3): qty 2

## 2022-03-07 MED ORDER — LOSARTAN POTASSIUM 50 MG PO TABS
50.0000 mg | ORAL_TABLET | Freq: Every day | ORAL | Status: DC
  Administered 2022-03-07: 50 mg via ORAL
  Filled 2022-03-07: qty 1

## 2022-03-07 MED ORDER — POLYETHYLENE GLYCOL 3350 17 G PO PACK
17.0000 g | PACK | Freq: Every day | ORAL | Status: DC
  Filled 2022-03-07 (×2): qty 1

## 2022-03-07 MED ORDER — DARIFENACIN HYDROBROMIDE ER 15 MG PO TB24
15.0000 mg | ORAL_TABLET | Freq: Every day | ORAL | Status: DC
  Administered 2022-03-07 – 2022-03-08 (×2): 15 mg via ORAL
  Filled 2022-03-07 (×2): qty 1

## 2022-03-07 MED ORDER — SENNOSIDES-DOCUSATE SODIUM 8.6-50 MG PO TABS
1.0000 | ORAL_TABLET | Freq: Two times a day (BID) | ORAL | Status: DC
Start: 2022-03-07 — End: 2022-03-08
  Filled 2022-03-07 (×2): qty 1

## 2022-03-07 MED ORDER — GADOBUTROL 1 MMOL/ML IV SOLN
10.0000 mL | Freq: Once | INTRAVENOUS | Status: AC | PRN
  Administered 2022-03-07: 10 mL via INTRAVENOUS

## 2022-03-07 MED ORDER — BUPROPION HCL ER (XL) 300 MG PO TB24
300.0000 mg | ORAL_TABLET | Freq: Every day | ORAL | Status: DC
  Administered 2022-03-07 – 2022-03-08 (×2): 300 mg via ORAL
  Filled 2022-03-07 (×2): qty 1

## 2022-03-07 MED ORDER — ASPIRIN 81 MG PO TBEC
81.0000 mg | DELAYED_RELEASE_TABLET | Freq: Every day | ORAL | Status: DC
Start: 2022-03-07 — End: 2022-03-08
  Administered 2022-03-07 – 2022-03-08 (×2): 81 mg via ORAL
  Filled 2022-03-07 (×2): qty 1

## 2022-03-07 MED ORDER — CLONAZEPAM 1 MG PO TABS: 1.0000 mg | ORAL_TABLET | Freq: Four times a day (QID) | ORAL | Status: DC | PRN

## 2022-03-07 MED ORDER — ADULT MULTIVITAMIN W/MINERALS CH
1.0000 | ORAL_TABLET | Freq: Every day | ORAL | Status: DC
  Administered 2022-03-07 – 2022-03-08 (×2): 1 via ORAL
  Filled 2022-03-07 (×2): qty 1

## 2022-03-07 MED ORDER — ATORVASTATIN CALCIUM 40 MG PO TABS
80.0000 mg | ORAL_TABLET | Freq: Every day | ORAL | Status: DC
  Administered 2022-03-07 – 2022-03-08 (×2): 80 mg via ORAL
  Filled 2022-03-07 (×2): qty 2

## 2022-03-07 NOTE — Progress Notes (Addendum)
PROGRESS NOTE    [name]  ZOX:[id number] DOB: [birth year] DOA: 03/06/2022 PCP: Cassandria Anger, MD     Brief Narrative:   managed moderate to severe CAD, long-standing-persistent atrial fibrillation on Xarelto, chronic diastolic heart failure, hypertension, hyperlipidemia, sleep apnea on CPAP, past h/o diffuse large B cell lymphoma with concerns of recent recurrence under evaluation, presents to [facility] with altered mental status, was seen in the ED for the same on 5/17, was diagnosed with E. coli UTI, was discharged home on antibiotics, however confusion did not improve, return to the [facility] ED  Subjective:  He is sitting up in chair, does not appear to be in acute distress, he is alert and oriented x3 currently, husband still think he is confused as patient does not understand concepts, he could not engage which is not his baseline  Denies any urinary symptom No fever  Assessment & Plan:  Principal Problem:   Altered mental state Active Problems:   UTI (urinary tract infection)   Diffuse large B-cell lymphoma ([place])   Lung mass   History of recurrent UTIs   Paroxysmal atrial fibrillation (HCC)   CKD (chronic kidney disease) stage 3, GFR 30-59 ml/min (HCC)   Essential hypertension   Chronic diastolic heart failure ([place])   Hypothyroidism    Assessment and Plan:  Altered mental state , acute metabolic encephalopathy likely from recurrent UTI, reports h/o urinary incontinence, wearing depends at home -MRI brain pending to rule out CNS lymphoma -CT AP = nephrolithiasis but no obstructing stones, renal abscesses. extensive history of obstructing stones in past requiring intervention by [facility] -Looks like hes had trouble with recurrent UTIs in past, had to be on chronic ABx doxycycline in the past , per pcp Dr. Judeen Hammans ntoe -Blood culture no growth -Urine culture pending -Continue current abx   Diffuse large B-cell lymphoma ([place]) Recently recurrent with  skin lesion on buttock getting MRI brain to r/o CNS lymphoma given ongoing acute metabolic encephalopathy. Wound care consulted Oncology consulted  Lung mass Either a primary lung neoplasm or lymphoma involvement of the lung. Biopsy to be decided by oncology  AKI on CKD stage 3a Cr 1.3 in 01/2022, was  1.4-1.5 in 2022. cr1.6 on 02/24/22 and 5/27. Cr 1.38 today, hold losartan Treat UTI Check bladder scan to rule out urinary retention Repeat BMP in the morning  Persistent atrial fibrillation /chronic diastolic CHF Currently in afib , rate controlled  Cont home metoprolol, cardizem Cont home Xarelto Does not appear to be on diuretic at home, does not appear to be volume overloaded. Monitor    Essential hypertension Hold losartan, continue metoprolol, Cardizem, Imdur   Hypothyroidism Continue Synthroid, repeat tsh in 4-6 weeks     Body mass index is 30.54 kg/m., meet obesity criteria OSA on cpap   I have Reviewed nursing notes, Vitals, pain scores, I/o's, Lab results and  imaging results since pt's last encounter, details please see discussion above  I ordered the following labs:  Unresulted Labs (From admission, onward)     Start     Ordered   03/08/22 8119  Basic metabolic panel  Tomorrow morning,   R        03/07/22 1831             DVT prophylaxis: rivaroxaban (XARELTO) tablet 10 mg Start: 03/07/22 1000 rivaroxaban (XARELTO) tablet 10 mg   Code Status:   Code Status: Full Code  Family Communication: husband over the phone Disposition:   Dispo: The patient  is from: home              Anticipated d/c is to: home              Anticipated d/c date is: >24hrs, awaiting  mri brain, urine culture, mental status improvement  Antimicrobials:    Anti-infectives (From admission, onward)    Start     Dose/Rate Route Frequency Ordered Stop   03/07/22 1400  cefTRIAXone (ROCEPHIN) 1 g in sodium chloride 0.9 % 100 mL IVPB  Status:  Discontinued        1 g 200  mL/hr over 30 Minutes Intravenous Every 24 hours 03/06/22 2045 03/07/22 0917   03/07/22 1400  cefTRIAXone (ROCEPHIN) 2 g in sodium chloride 0.9 % 100 mL IVPB        2 g 200 mL/hr over 30 Minutes Intravenous Every 24 hours 03/07/22 0917     03/06/22 1515  cefTRIAXone (ROCEPHIN) 1 g in sodium chloride 0.9 % 100 mL IVPB        1 g 200 mL/hr over 30 Minutes Intravenous  Once 03/06/22 1513 03/06/22 1613          Objective: Vitals:   03/07/22 0230 03/07/22 0530 03/07/22 0900 03/07/22 1512  BP: (!) 143/80 125/89 134/83 (!) 97/55  Pulse: 85 89 90 87  Resp: '18 18  18  '$ Temp: 98.2 F (36.8 C) (!) 97.4 F (36.3 C)  97.7 F (36.5 C)  TempSrc: Oral Oral  Oral  SpO2: 95% 96%  95%  Weight:      Height:        Intake/Output Summary (Last 24 hours) at 03/07/2022 1831 Last data filed at 03/07/2022 1700 Gross per 24 hour  Intake 1540.11 ml  Output 200 ml  Net 1340.11 ml   Filed Weights   03/06/22 1338 03/06/22 2032  Weight: 122.9 kg 113.8 kg    Examination:  General exam: aaox3, does appear slightly confused  Respiratory system: Clear to auscultation. Respiratory effort normal. Cardiovascular system:  RRR.  Gastrointestinal system: Abdomen is nondistended, soft and nontender.  Normal bowel sounds heard. Central nervous system: Alert and orientedx3. No focal neurological deficits. Extremities:  no edema Skin: know left buttock skin lesion Psychiatry: calm and cooperative     Data Reviewed: I have personally reviewed  labs and visualized  imaging studies since the last encounter and formulate the plan        Scheduled Meds:  alfuzosin  10 mg Oral QHS   aspirin EC  81 mg Oral Daily   atorvastatin  80 mg Oral Daily   buPROPion  300 mg Oral Daily   darifenacin  15 mg Oral Daily   diltiazem  240 mg Oral Daily   DULoxetine  60 mg Oral BID   ezetimibe  10 mg Oral Daily   ferrous sulfate  325 mg Oral BID WC   isosorbide mononitrate  30 mg Oral Daily   levothyroxine  100 mcg  Oral Q0600   loratadine  10 mg Oral Daily   magnesium oxide  400 mg Oral QPM   metoprolol tartrate  25 mg Oral BID   mirabegron ER  25 mg Oral Daily   multivitamin with minerals  1 tablet Oral Daily   pantoprazole  80 mg Oral Daily   polyethylene glycol  17 g Oral Daily   rivaroxaban  10 mg Oral Daily   senna-docusate  1 tablet Oral BID   Continuous Infusions:  cefTRIAXone (ROCEPHIN)  IV 2 g (03/07/22  1445)     LOS: 1 day    Florencia Reasons, MD PhD FACP [facility]  Available via Epic secure chat 7am-7pm for nonurgent issues Please page for urgent issues To page the attending provider between 7A-7P or the covering provider during after hours 7P-7A, please log into the web site [email or web address] and access using universal [place] password for that web site. If you do not have the password, please call the hospital operator.    03/07/2022, 6:31 PM

## 2022-03-07 NOTE — Progress Notes (Signed)
Pt has declined use of CPAP QHS at this time.

## 2022-03-07 NOTE — Progress Notes (Signed)
[name]  HEMATOLOGY/ONCOLOGY INPATIENT PROGRESS NOTE  Date of Service: 03/07/2022  Inpatient Attending: .Florencia Reasons, MD   SUBJECTIVE  [name] was seen with his husband at bedside.  He is well-known to me from clinic.  He recently relapsed large B-cell lymphoma based on shave biopsy from his lesion on the left buttocks.  He recently had a PET CT scan as outpatient and was to follow-up with [place] in clinic but has been readmitted with altered mental status for a second time.  First and altered mental status was shocked out for UTI and he was treated with Bactrim.  His urine still shows concern for urinary tract infection in the context of bladder issues. He had a CT abdomen which showed no stones or urinary obstruction or renal abscesses.  He has been started on IV ceftriaxone pending urine culture results. I discussed his recent PET/CT results with him in detail which show a sizable lung mass in addition to lymphadenopathy in the neck chest abdomen pelvis as well as his left buttocks lesion. We discussed in detail the pros and cons of considering a bronchoscopic biopsy of his lung lesion to rule out lung cancer and the fact that it is unlikely that he has 2 malignancies at the same time and that his lung lesion is likely related to lymphoma. He is also on Xarelto which makes his pulmonary biopsy more complicated. Patient and his husband at bedside decided not to proceed with the pulmonary mass biopsy and treat this like diffuse large B-cell lymphoma. We did discuss getting a core needle biopsy of his right buttocks mass for some additional testing and to confirm the diagnosis and also to do some additional molecular testing on his large B-cell lymphoma. We discussed that we shall discuss second line chemoimmunotherapy treatment options with him in the next couple of days once his UTI settles down. He had an MRI of the brain as per our recommendations which did not show any obvious lymphomatous  involvement of the meninges or intraparenchymal lymphoma lesions.    OBJECTIVE: NAD  PHYSICAL EXAMINATION: . Vitals:   03/07/22 0026 03/07/22 0230 03/07/22 0530 03/07/22 0900  BP: (!) 148/79 (!) 143/80 125/89 134/83  Pulse: 86 85 89 90  Resp: '18 18 18   '$ Temp: 98.8 F (37.1 C) 98.2 F (36.8 C) (!) 97.4 F (36.3 C)   TempSrc: Oral Oral Oral   SpO2: 94% 95% 96%   Weight:      Height:       Filed Weights   03/06/22 1338 03/06/22 2032  Weight: 271 lb (122.9 kg) 250 lb 14.1 oz (113.8 kg)   .Body mass index is 30.54 kg/m.  GENERAL:alert, in no acute distress a NECK: supple, no JVD, thyroid normal size, non-tender, without nodularity LYMPH:  no palpable lymphadenopathy in the cervical, axillary or inguinal LUNGS: clear to auscultation with normal respiratory effort HEART: regular rate & rhythm,  no murmurs and no lower extremity edema ABDOMEN: abdomen soft, non-tender, normoactive bowel sounds  Musculoskeletal: no cyanosis of digits and no clubbing  PSYCH: alert & oriented x 3 with fluent speech NEURO: no focal motor/sensory deficits  MEDICAL HISTORY:  Past Medical History:  Diagnosis Date   Anticoagulant long-term use    xarelto--- takes for AFib,  managed by pcp, Dr Alain Marion   Benign localized prostatic hyperplasia with lower urinary tract symptoms (LUTS)    CAD (coronary artery disease) cardiologist--  dr t. Oval Linsey   hx of known CAD obstruction w/ collaterals (  cath done @ [facility] 12-31-2011) ;   last cardiac cath 08-13-2019  showed sig. 2V CAD involving proxLAD and CTO of the RCA/  chronic total occlusion midRCA w/ bridging and L>R collaterals   Diastolic CHF ([place])    dx [phone] hospital admission (followed by pcp)   Diffuse large B cell lymphoma [facility]) oncologist-- dr Irene Limbo--- in remission   dx 08/ 2019 -- bx left tonsill mass-- involving lymph nodes--- completed chemotherapy 10-12-2018   DJD (degenerative joint disease)    Dysthymic disorder    Elevated brain  natriuretic peptide (BNP) level    Environmental allergies    GAD (generalized anxiety disorder)    GERD (gastroesophageal reflux disease)    History of falling    multiple times   History of Graves' disease    1987 s/p total thyroidectomy   History of kidney stones    History of scabies    07/ 2014  resolved   History of syncope    per D/C note in epic 02-21-2013 ?vasovagal   History of vertebral compression fracture    05/ 2014 --  L1, no surgical intervention   HLD (hyperlipidemia)    Hyperlipidemia    Hypertension    Hypogonadism in male    IDA (iron deficiency anemia)    Lumbago    OA (osteoarthritis)    OSA on CPAP    cpap machine-settings 17   Osteoporosis    Severe   Persistent atrial fibrillation [facility])    cardiologist--- dr Alfonso Patten. [place]   Plaque psoriasis    dermatologist--- dr Harriett Sine--- currently taking otezla   Post-surgical hypothyroidism    followed by pcp---  s/p total thyroidectomy for graves disease in 1987   Pure hypercholesterolemia 02/10/2021   Renal calculus, right    Thrombocytopenia ([place])    Unsteady gait    uses walker   Wears hearing aid in both ears     SURGICAL HISTORY: Past Surgical History:  Procedure Laterality Date   APPENDECTOMY  2011 approx.    CARDIAC CATHETERIZATION  12-31-2011   dr j. Beatrix Fetters '@[facility]'$    normal LVF w/ multivessel CAD,  occluded RCA w/ colleterals   CATARACT EXTRACTION W/ INTRAOCULAR LENS  IMPLANT, BILATERAL  2016 approx.   COLONOSCOPY     CYSTOSCOPY WITH RETROGRADE PYELOGRAM, URETEROSCOPY AND STENT PLACEMENT Left 11/07/2017   Procedure: CYSTOSCOPY WITH RETROGRADE PYELOGRAM, URETEROSCOPY AND STENT PLACEMENT;  Surgeon: Cleon Gustin, MD;  Location: [facility];  Service: Urology;  Laterality: Left;   CYSTOSCOPY WITH RETROGRADE PYELOGRAM, URETEROSCOPY AND STENT PLACEMENT Right 10/22/2019   Procedure: CYSTOSCOPY WITH RETROGRADE PYELOGRAM, URETEROSCOPY AND STENT PLACEMENT;  Surgeon: Cleon Gustin, MD;  Location:  [facility];  Service: Urology;  Laterality: Right;   CYSTOSCOPY/URETEROSCOPY/HOLMIUM LASER/STENT PLACEMENT Right 03/11/2017   Procedure: CYSTOSCOPY/URETEROSCOPYSTENT PLACEMENT right ureter retrograde pylegram;  Surgeon: Cleon Gustin, MD;  Location: [facility];  Service: Urology;  Laterality: Right;   HARDWARE REMOVAL  10/05/2012   Procedure: HARDWARE REMOVAL;  Surgeon: [name], MD;  Location: [facility];  Service: Orthopedics;  Laterality: Right;  REMOVING  STRYKER  GAMMA NAIL   HARDWARE REMOVAL Right 07/03/2013   Procedure: HARDWARE REMOVAL RIGHT TIBIA ;  Surgeon: Rozanna Box, MD;  Location: [place];  Service: Orthopedics;  Laterality: Right;   HIP CLOSED REDUCTION Right 10/15/2013   Procedure: CLOSED MANIPULATION HIP;  Surgeon: [name], MD;  [facility]: [facility];  Service: Orthopedics;  Laterality: Right;   HOLMIUM LASER APPLICATION Right  02/08/2017   Procedure: HOLMIUM LASER APPLICATION;  Surgeon: Cleon Gustin, MD;  [facility];  Service: Urology;  Laterality: Right;   HOLMIUM LASER APPLICATION Left 1/61/0960   Procedure: HOLMIUM LASER APPLICATION;  Surgeon: Cleon Gustin, MD;  [facility];  Service: Urology;  Laterality: Left;   HOLMIUM LASER APPLICATION Left 01/13/4097   Procedure: HOLMIUM LASER APPLICATION;  Surgeon: Cleon Gustin, MD;  [facility];  Service: Urology;  Laterality: Left;   HOLMIUM LASER APPLICATION Right 10/29/1476   Procedure: HOLMIUM LASER APPLICATION;  Surgeon: Cleon Gustin, MD;  Location: [facility];  Service: Urology;  Laterality: Right;   INCISION AND DRAINAGE HIP Right 11/16/2013   Procedure: IRRIGATION AND DEBRIDEMENT RIGHT HIP;  Surgeon: [name], MD;  [facility];  Service: Orthopedics;  Laterality: Right;   INTRAVASCULAR PRESSURE WIRE/FFR STUDY N/A 08/13/2019   Procedure: INTRAVASCULAR PRESSURE WIRE/FFR STUDY;  Surgeon: Nelva Bush, MD;  Location: [place] [facility];  Service:  Cardiovascular;  Laterality: N/A;   IR IMAGING GUIDED PORT INSERTION  06/22/2018   IR NEPHROSTOMY PLACEMENT LEFT  03/28/2019   IR URETERAL STENT LEFT NEW ACCESS W/O SEP NEPHROSTOMY CATH  10/24/2017   IR URETERAL STENT LEFT NEW ACCESS W/O SEP NEPHROSTOMY CATH  03/26/2019   NEPHROLITHOTOMY Right 02/08/2017   Procedure: NEPHROLITHOTOMY PERCUTANEOUS WITH SURGEON ACCESS;  Surgeon: Cleon Gustin, MD;  [facility];  Service: Urology;  Laterality: Right;   NEPHROLITHOTOMY Left 10/24/2017   Procedure: NEPHROLITHOTOMY PERCUTANEOUS;  Surgeon: Cleon Gustin, MD;  [facility];  Service: Urology;  Laterality: Left;   NEPHROLITHOTOMY Left 03/26/2019   Procedure: NEPHROLITHOTOMY PERCUTANEOUS;  Surgeon: Cleon Gustin, MD;  [facility];  Service: Urology;  Laterality: Left;  2 HRS   NEPHROLITHOTOMY Left 04/17/2019   Procedure: NEPHROLITHOTOMY PERCUTANEOUS;  Surgeon: Cleon Gustin, MD;  [facility];  Service: Urology;  Laterality: Left;  2 HRS   ORIF TIBIA FRACTURE Right 02/06/2013   Procedure: OPEN REDUCTION INTERNAL FIXATION (ORIF) TIBIA FRACTURE WITH IM ROD FIBULA;  Surgeon: Rozanna Box, MD;  Location: [place];  Service: Orthopedics;  Laterality: Right;   ORIF TIBIA FRACTURE Right 07/03/2013   Procedure: RIGHT TIBIA NON UNION REPAIR ;  Surgeon: Rozanna Box, MD;  Location: [place];  Service: Orthopedics;  Laterality: Right;   ORIF WRIST FRACTURE  10/02/2012   Procedure: OPEN REDUCTION INTERNAL FIXATION (ORIF) WRIST FRACTURE;  Surgeon: Roseanne Kaufman, MD;  [facility];  Service: Orthopedics;  Laterality: Right;  WITH   ANTIBIOTIC  CEMENT   ORIF WRIST FRACTURE Left 10/28/2013   Procedure: OPEN REDUCTION INTERNAL FIXATION (ORIF) WRIST FRACTURE with allograft;  Surgeon: Roseanne Kaufman, MD;  [facility];  Service: Orthopedics;  Laterality: Left;  DVR Plate   QUADRICEPS TENDON REPAIR Left 07/15/2017   Procedure: REPAIR QUADRICEP TENDON;  Surgeon: Frederik Pear, MD;  Location:  [place];  Service: Orthopedics;  Laterality: Left;   RIGHT/LEFT HEART CATH AND CORONARY ANGIOGRAPHY N/A 08/13/2019   Procedure: RIGHT/LEFT HEART CATH AND CORONARY ANGIOGRAPHY;  Surgeon: Nelva Bush, MD;  Location: [place] [facility];  Service: Cardiovascular;  Laterality: N/A;   THYROIDECTOMY  02/1986   TOTAL HIP ARTHROPLASTY Right 03-16-2016   '@[facility]'$    TOTAL HIP REVISION  10/05/2012   Procedure: TOTAL HIP REVISION;  Surgeon: [name], MD;  [facility];  Service: Orthopedics;  Laterality: Right;  RIGHT TOTAL HIP REVISION   TOTAL HIP REVISION Right  09/17/2013   Procedure: REVISION RIGHT TOTAL HIP ARTHROPLASTY ;  Surgeon: [name], MD;  [facility];  Service: Orthopedics;  Laterality: Right;   TOTAL HIP REVISION Right 10/26/2013   Procedure: REVISION RIGHT TOTAL HIP ARTHROPLASTY;  Surgeon: [name], MD;  [facility];  Service: Orthopedics;  Laterality: Right;   TOTAL KNEE ARTHROPLASTY Bilateral right 03-15-2011;  left 06-30-2011   TOTAL KNEE REVISION Left 04/11/2017   Procedure: TOTAL KNEE REVISION PATELLA and TIBIA;  Surgeon: Frederik Pear, MD;  Location: [place];  Service: Orthopedics;  Laterality: Left;    SOCIAL HISTORY: Social History   Socioeconomic History   Marital status: Married    Spouse name: Not on file   Number of children: Not on file   Years of education: Not on file   Highest education level: Not on file  Occupational History   Not on file  Tobacco Use   Smoking status: Former    Packs/day: 1.00    Years: 50.00    Pack years: 50.00    Types: Cigarettes    Quit date: 01/12/2012    Years since quitting: 10.1   Smokeless tobacco: Never  Vaping Use   Vaping Use: Never used  Substance and Sexual Activity   Alcohol use: Not Currently    Comment: occasional-social   Drug use: Never   Sexual activity: Yes  Other Topics Concern   Not on file  Social History Narrative   [place] 2.5 months, went home Feb 21st slipped and fell on back and  developed.  Home PT/OT.  Just started outpatient physical therapy.  Friday night, misstepped.     Social Determinants of Health   Financial Resource Strain: Low Risk    Difficulty of Paying Living Expenses: Not hard at all  Food Insecurity: No Food Insecurity   Worried About Charity fundraiser in the Last Year: Never true   [place] in the Last Year: Never true  Transportation Needs: No Transportation Needs   Lack of Transportation (Medical): No   Lack of Transportation (Non-Medical): No  Physical Activity: Sufficiently Active   Days of Exercise per Week: 3 days   Minutes of Exercise per Session: 60 min  Stress: No Stress Concern Present   Feeling of Stress : Not at all  Social Connections: Socially Integrated   Frequency of Communication with Friends and Family: More than three times a week   Frequency of Social Gatherings with Friends and Family: More than three times a week   Attends Religious Services: 1 to 4 times per year   Active Member of [employer] or Organizations: No   Attends Music therapist: 1 to 4 times per year   Marital Status: Married  Human resources officer Violence: Not At Risk   Fear of Current or Ex-Partner: No   Emotionally Abused: No   Physically Abused: No   Sexually Abused: No    FAMILY HISTORY: Family History  Problem Relation Age of Onset   CAD Father 22   Asthma Father    Alcohol abuse Father    Arthritis Mother    Alcohol abuse Sister     ALLERGIES:  is allergic to short ragweed pollen ext.  MEDICATIONS:  Scheduled Meds:  alfuzosin  10 mg Oral QHS   aspirin EC  81 mg Oral Daily   atorvastatin  80 mg Oral Daily   buPROPion  300 mg Oral Daily   darifenacin  15 mg Oral Daily  diltiazem  240 mg Oral Daily   DULoxetine  60 mg Oral BID   ezetimibe  10 mg Oral Daily   ferrous sulfate  325 mg Oral BID WC   isosorbide mononitrate  30 mg Oral Daily   levothyroxine  100 mcg Oral Q0600   loratadine  10 mg Oral Daily   losartan  50  mg Oral Daily   magnesium oxide  400 mg Oral QPM   metoprolol tartrate  25 mg Oral BID   mirabegron ER  25 mg Oral Daily   multivitamin with minerals  1 tablet Oral Daily   pantoprazole  80 mg Oral Daily   rivaroxaban  10 mg Oral Daily   Continuous Infusions:  cefTRIAXone (ROCEPHIN)  IV     PRN Meds:.acetaminophen **OR** acetaminophen, albuterol, clonazePAM, HYDROcodone-acetaminophen, ondansetron **OR** ondansetron (ZOFRAN) IV, tiZANidine  REVIEW OF SYSTEMS:    10 Point review of Systems was done is negative except as noted above.   LABORATORY DATA:  I have reviewed the data as listed  .    Latest Ref Rng & Units 03/07/2022    5:32 AM 03/06/2022    2:46 PM 02/24/2022    3:02 PM  CBC  WBC 4.0 - 10.5 K/uL 7.5   7.0   7.8    Hemoglobin 13.0 - 17.0 g/dL 16.6   17.1   16.8    Hematocrit 39.0 - 52.0 % 48.7   51.0   51.4    Platelets 150 - 400 K/uL 152   164   165      .    Latest Ref Rng & Units 03/07/2022    5:32 AM 03/06/2022    2:46 PM 02/24/2022    3:02 PM  CMP  Glucose 70 - 99 mg/dL 82   85   106    BUN 8 - 23 mg/dL 22   28   37    Creatinine 0.61 - 1.24 mg/dL 1.38   1.63   1.61    Sodium 135 - 145 mmol/L 133   135   135    Potassium 3.5 - 5.1 mmol/L 3.7   3.7   4.9    Chloride 98 - 111 mmol/L 98   95   98    CO2 22 - 32 mmol/L '25   28   28    '$ Calcium 8.9 - 10.3 mg/dL 8.6   9.5   10.1    Total Protein 6.5 - 8.1 g/dL  7.1   7.1    Total Bilirubin 0.3 - 1.2 mg/dL  1.4   1.5    Alkaline Phos 38 - 126 U/L  71   73    AST 15 - 41 U/L  36   30    ALT 0 - 44 U/L  28   28     . Lab Results  Component Value Date   LDH 251 (H) 01/25/2022     RADIOGRAPHIC STUDIES: I have personally reviewed the radiological images as listed and agreed with the findings in the report. CT Head Wo Contrast  Result Date: 02/24/2022 CLINICAL DATA:  Head trauma. EXAM: CT HEAD WITHOUT CONTRAST TECHNIQUE: Contiguous axial images were obtained from the base of the skull through the vertex  without intravenous contrast. RADIATION DOSE REDUCTION: This exam was performed according to the departmental dose-optimization program which includes automated exposure control, adjustment of the mA and/or kV according to patient size and/or use of iterative reconstruction technique. COMPARISON:  Head CT  dated 11/08/2018. FINDINGS: Brain: Mild age-related atrophy and chronic microvascular ischemic changes. There is no acute intracranial hemorrhage. No mass effect or midline shift. No extra-axial fluid collection. Vascular: No hyperdense vessel or unexpected calcification. Skull: Normal. Negative for fracture or focal lesion. Sinuses/Orbits: The visualized paranasal sinuses and the left mastoid air cells are clear. Mild right mastoid effusions. Other: None IMPRESSION: 1. No acute intracranial pathology. 2. Mild age-related atrophy and chronic microvascular ischemic changes. Electronically Signed   By: Anner Crete M.D.   On: 02/24/2022 20:35   MR BRAIN W WO CONTRAST  Result Date: 03/07/2022 CLINICAL DATA:  Brain metastases suspected EXAM: MRI HEAD WITHOUT AND WITH CONTRAST TECHNIQUE: Multiplanar, multiecho pulse sequences of the brain and surrounding structures were obtained without and with intravenous contrast. CONTRAST:  98m GADAVIST GADOBUTROL 1 MMOL/ML IV SOLN COMPARISON:  PET CT from 5 days ago FINDINGS: Brain: No enhancement or swelling to suggest metastatic disease. Small FLAIR hyperintensities in the cerebral white matter attributed to chronic small vessel ischemia. No acute infarct, hydrocephalus, or collection. Cerebral volume loss which is generalized. Vascular: Slow flow in the right transverse and sigmoid dural sinuses when comparing T2 and postcontrast imaging, attributed to tumoral compression in the right neck. Skull and upper cervical spine: No skull base erosion or visible bony metastasis. Sinuses/Orbits: Right mastoid opacification associated with a nasopharyngeal mass which is right  eccentric. Other: Nasopharyngeal mass crossing midline but bulky towards the right with length of 6 cm and thickness of 18 mm. There is contact of the carotid and parapharyngeal spaces on the right. No evidence of intracranial extension IMPRESSION: 1. 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 No skull base erosion or intracranial extension. 2. Right mastoid effusion. Electronically Signed   By: JJorje GuildM.D.   On: 03/07/2022 11:53   CT ABDOMEN PELVIS W CONTRAST  Result Date: 03/06/2022 CLINICAL DATA:  Urinary frequency. Undergoing treatment for urinary tract infection without improvement. Clinical concern for renal mass or abscess. Undergoing chemotherapy for diffuse large B cell lymphoma. EXAM: CT ABDOMEN AND PELVIS WITH CONTRAST TECHNIQUE: Multidetector CT imaging of the abdomen and pelvis was performed using the standard protocol following bolus administration of intravenous contrast. RADIATION DOSE REDUCTION: This exam was performed according to the departmental dose-optimization program which includes automated exposure control, adjustment of the mA and/or kV according to patient size and/or use of iterative reconstruction technique. CONTRAST:  1087mOMNIPAQUE IOHEXOL 300 MG/ML  SOLN COMPARISON:  PET-CT dated 03/02/2022 and abdomen and pelvis CT dated 09/05/2019. FINDINGS: Lower chest: The 4.3 x 3.9 cm right lower lobe lung mass seen on 03/02/2022 currently measures 4.7 x 4.4 cm on image number 15/4. There is interval visualization of a 5 mm similar appearing nodule adjacent to this mass posteriorly and superiorly on image number 13/4. There is also bronchial mucous plugging at the posterior, inferior aspect of the mass, without significant change. The heart remains mildly enlarged. Minimal pericardial effusion with a maximum thickness of 6 mm. Atheromatous calcifications, including the coronary arteries and aorta. Hepatobiliary: Unremarkable liver. Mild  sludge or noncalcified gallstones in the dependent portion of the gallbladder. No gallbladder wall thickening or pericholecystic fluid. Pancreas: Moderate diffuse pancreatic atrophy. Spleen: Normal in size without focal abnormality. Adrenals/Urinary Tract: Low-density right adrenal nodule with medium density thin internal septations in the superior aspect of the right adrenal gland, measuring 2.9 x 1.5 cm on coronal image number 47, without significant change since 09/05/2019. No increased activity on the recent  PET-CT. Unremarkable left adrenal gland. Tiny lower pole right renal calculus. 4 mm mid left renal calculus. Small mid left renal cyst. No bladder or ureteral calculi no hydronephrosis. The distal right ureter is obscured by streak artifacts from patient's right hip prosthesis. Stomach/Bowel: Distended rectum containing stool and gas. Moderate stool throughout the remainder of the colon. Unremarkable stomach and small bowel. The appendix is not visualized and surgically absent by history. Vascular/Lymphatic: Atheromatous arterial calcifications without aneurysm. Enlarged, recently hypermetabolic left inguinal lymph node with a short axis diameter 19 mm on image number 98/2. Multiple additional enlarged left pelvic lymph nodes, including a common femoral node with a short axis diameter of 31 mm on image number 84/2 and no obturator node with a short axis diameter of 27 mm on image number 81/2. Reproductive: Prostate is unremarkable. Other: Small to moderate-sized ventral hernia containing fat small to moderate-sized infraumbilical hernia containing a small to moderate-sized supraumbilical hernia containing fat without change. Small left inguinal hernia containing fat. Musculoskeletal: Right hip prosthesis with associated streak artifact. Severe L1 vertebral compression deformity with bony retropulsion causing moderate to marked canal stenosis without significant change. Associated acute kyphosis without  significant change. No acute fractures or acute subluxations. Mild anterior spur formation at multiple levels in the lower thoracic spine and moderate to large anterior spurs at the anterior T12 and L2 levels on both sides of the severely compressed L1 vertebra. Stable anterior subluxation of the lumbar spine inferior to the L1 fracture relative to the spine above the fracture. Marked right psoas, iliacus and iliopsoas muscle atrophy. IMPRESSION: 1. No renal mass or abscess. 2. Small, nonobstructing calculus in each kidney. 3. Mild increase in size of the previously demonstrated right lower lobe hypermetabolic mass with an interval small satellite nodule posteriorly. This is concerning for a primary lung neoplasm or lymphoma involving the lung. 4. Recently demonstrated left pelvic and inguinal adenopathy, most likely due to the patient's known lymphoma. 5. Stable small to moderate-sized supraumbilical hernia containing herniated fat. 6. Stable right adrenal probable adenoma. Electronically Signed   By: Claudie Revering M.D.   On: 03/06/2022 16:39   NM PET Image Restag (PS) Skull Base To Thigh  Result Date: 03/02/2022 CLINICAL DATA:  Follow-up treatment strategy for diffuse large B-cell lymphoma. EXAM: NUCLEAR MEDICINE PET SKULL BASE TO THIGH TECHNIQUE: 0.8 mCi F-18 FDG was injected intravenously. Full-ring PET imaging was performed from the skull base to thigh after the radiotracer. CT data was obtained and used for attenuation correction and anatomic localization. Fasting blood glucose: 107 mg/dl COMPARISON:  PET-CT dated November 22, 2018; PET-CT dated August 22, 2018 FINDINGS: Mediastinal blood pool activity: SUV max 2.8 Liver activity: SUV max 3.8 NECK: New marked hypermetabolic soft tissue thickening of the right posterior nasopharynx with SUV max of 20.5. Large lymph node located anterior to the right sternocleidomastoid muscle unchanged in size, measuring 2.2 cm in short axis, and demonstrates similar mild  FDG uptake, SUV max of 2.6, previously 2.4. New hypermetabolic subcentimeter right level IIb lymph node measuring 5 mm on series 4, image 34. Incidental CT findings: none CHEST: New hypermetabolic right lower lobe lung mass measuring 3.9 x 4.3 cm with SUV max of 12.4. Incidental CT findings: Left main and three-vessel coronary artery calcifications. Atherosclerotic disease of the thoracic aorta. Right chest wall port with tip in the mid SVC. ABDOMEN/PELVIS: Enlarged hypermetabolic left pelvic and left inguinal lymph nodes. Reference left pelvic sidewall lymph node measuring 2.8 cm in short axis on series 4,  image 190 with SUV max of 33.5. Enlarged left inguinal lymph node measuring 1.9 cm in short axis on series 4, image 207 with SUV max of hypermetabolic soft tissue nodules and masses of the left buttocks. Largest measures 7.7 x 4.1 cm on series 4, image 189 with SUV max of 18.5. Incidental CT findings: Simple appearing cyst of the left kidney, no further follow-up imaging is recommended for this lesion. Nonobstructing stone of the left kidney. Atherosclerotic disease of the abdominal aorta. Moderate fat containing umbilical hernia. Air is seen in the urinary bladder. SKELETON: No suspicious focal uptake to suggest osseous metastatic disease. Incidental CT findings: Prior total left hip arthroplasty with cerclage wires and surrounding hypermetabolic activity, similar to prior exam. Unchanged severe compression deformity at L1. Spinous process fractures with associated FDG uptake extending from approximately the T8-T1 levels. IMPRESSION: 1. New marked hypermetabolic soft tissue thickening of the right posterior nasopharynx. 2. New hypermetabolic subcentimeterright level IIb lymph node. 3. New hypermetabolic right lower lobe lung mass. 4. New enlarged and hypermetabolic left pelvic and left inguinal lymph nodes. 5. New hypermetabolic soft tissue nodules and masses of the left buttocks. 6. Enlarged lymph node located  anterior to the right sternocleidomastoid muscle is unchanged in size and demonstrates similar FDG uptake, likely treated disease. 7. Spinous process fractures with associated FDG uptake extending from approximately the T8-T1 levels. Correlate for history of recent trauma. 8. Air is seen in the urinary bladder, possibly due to recent instrumentation or infection. Correlate with urinalysis. Electronically Signed   By: Yetta Glassman M.D.   On: 03/02/2022 15:53   DG Chest Port 1 View  Result Date: 03/06/2022 CLINICAL DATA:  Possible sepsis. EXAM: PORTABLE CHEST 1 VIEW COMPARISON:  10/13/2021. FINDINGS: Cardiac silhouette is normal in size. No mediastinal or hilar masses. Right anterior chest wall Port-A-Cath is stable, tip in the mid superior vena cava. Chronic prominence of the interstitial markings bilaterally. Lungs otherwise clear. No pleural effusion or pneumothorax. Skeletal structures are grossly intact. IMPRESSION: No active disease. Electronically Signed   By: Lajean Manes M.D.   On: 03/06/2022 15:04    ASSESSMENT & PLAN:   76 y.o. male with   1. Diffuse Large B-Cell Lymphoma,- Stage III Left tonsil -05/29/18 Left tonsil biopsy which revealed Diffuse Large B-Cell Lymphoma, which requires treatment  -06/07/18 LDH at 214, will monitor change through treatment.  -Baseline ECHO 06/14/18: EF 50-55% -Standard regimen of R-CHOP with G-CSF support for 6 cycles starting 06/23/18   06/16/18 PET/CT revealed 1. Deauville 5 hypermetabolic lesions in the palatine tonsils, left internal jugular chain, and involving a right inguinal lymph node. Deauville 4 involvement of a left inguinal lymph node and an enlarged subcutaneous lesion favoring lymph node superficial to the lower right sternocleidomastoid muscle in the neck. 2. Other imaging findings of potential clinical significance: Aortic Atherosclerosis. Coronary atherosclerosis. Emphysema. Bilateral nonobstructive nephrolithiasis. Umbilical hernia contains  adipose tissues. Old right rib fractures some of which are nonunited. 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    08/22/18 PET/CT revealed Interval response to therapy. Overall Deauville criteria 3. Interval decrease in size and FDG uptake associated with palatine tonsil lesions. There has been resolution of previous left level-II and bilateral inguinal lymph nodes. 2. Enlarged subcutaneous lesion superficial to the lower right sternocleidomastoid muscle exhibits decreased FDG uptake. Currently Deauville criteria 2. 3. No new or progressive sites of disease identified. 4.  Aortic Atherosclerosis. 5. Multi vessel coronary artery atherosclerotic calcifications. 6. Unchanged vertebra plana deformity. 7. Kidney stones.  S/p 6 cycles of R-CHOP completed on 10/12/18   11/22/18 PET/CT revealed The tonsillar mass has essentially resolved. The large presumed lymph node superficial to the right sternocleidomastoid is stable in size at 2.3 cm in short axis, with maximum SUV of 3.3 (formerly 2.4), representing Deauville 3 activity, previously Deauville 2. 2. Other imaging findings of potential clinical significance: Aortic Atherosclerosis and Emphysema. Coronary atherosclerosis. Bilateral nonobstructive nephrolithiasis. Umbilical hernia contains adipose tissue. Chronic high activity posterior to the right hip implant along a region of bony irregularity and deformity probably reflecting chronic inflammation. L1 vertebra plana.   2.  Relapsed large B-cell lymphoma with extensive involvement including lung mass lesion over the left buttocks and lymphadenopathy.  3.  Altered mental status likely due to septic encephalopathy related to his UTI. MRI brain with no overt evidence of involvement of lymphoma intraparenchymally or leptomeningeal involvement.  Plan -I discussed his recent PET/CT results with him in detail which show a sizable lung mass in addition to lymphadenopathy in the neck chest abdomen pelvis as well as his left buttocks  lesion. We discussed in detail the pros and cons of considering a bronchoscopic biopsy of his lung lesion to rule out lung cancer and the fact that it is unlikely that he has 2 malignancies at the same time and that his lung lesion is likely related to lymphoma. He is also on Xarelto which makes his pulmonary biopsy more complicated. Patient and his husband at bedside decided not to proceed with the pulmonary mass biopsy and treat this like diffuse large B-cell lymphoma. We did discuss getting a core needle biopsy of his right buttocks mass for some additional testing and to confirm the diagnosis and also to do some additional molecular testing on his large B-cell lymphoma. We discussed that we shall discuss second line chemoimmunotherapy treatment options with him in the next couple of days once his UTI settles down. He had an MRI of the brain as per our recommendations which did not show any obvious lymphomatous involvement of the meninges or intraparenchymal lymphoma lesions.   I spent 25 minutes more counseling the patient face to face. The total time spent in the appointment was 40 minutes and more than 50% was on counseling and direct patient cares.    Sullivan Lone MD [place] AAHIVMS [facility] Hematology/Oncology Physician [facility]  (Office):       [phone] (Work cell):  [phone] (Fax):           [phone]  03/07/2022 2:00 PM

## 2022-03-08 DIAGNOSIS — N3000 Acute cystitis without hematuria: Secondary | ICD-10-CM

## 2022-03-08 LAB — BASIC METABOLIC PANEL
Anion gap: 8 (ref 5–15)
BUN: 30 mg/dL — ABNORMAL HIGH (ref 8–23)
CO2: 26 mmol/L (ref 22–32)
Calcium: 9.2 mg/dL (ref 8.9–10.3)
Chloride: 104 mmol/L (ref 98–111)
Creatinine, Ser: 1.51 mg/dL — ABNORMAL HIGH (ref 0.61–1.24)
GFR, Estimated: 48 mL/min — ABNORMAL LOW (ref >60)
Glucose, Bld: 119 mg/dL — ABNORMAL HIGH (ref 70–99)
Potassium: 3.7 mmol/L (ref 3.5–5.1)
Sodium: 138 mmol/L (ref 135–145)

## 2022-03-08 LAB — URINE CULTURE: Culture: 70000 — AB

## 2022-03-08 MED ORDER — ENSURE ENLIVE PO LIQD: 237.0000 mL | Freq: Two times a day (BID) | ORAL | Status: DC

## 2022-03-08 MED ORDER — CIPROFLOXACIN HCL 500 MG PO TABS: 500.0000 mg | ORAL_TABLET | Freq: Two times a day (BID) | ORAL | 0 refills | Status: AC

## 2022-03-08 MED ORDER — SODIUM CHLORIDE 0.9 % IV SOLN
2.0000 g | Freq: Two times a day (BID) | INTRAVENOUS | Status: DC
  Filled 2022-03-08: qty 12.5

## 2022-03-08 NOTE — Progress Notes (Signed)
Initial Nutrition Assessment  DOCUMENTATION CODES:   Not applicable  INTERVENTION:  - will order Ensure Plus High Protein BID, each supplement provides 350 kcal and 20 grams of protein.  - will enter smart phrase in AVS for patient to drink 2 bottles of oral nutrition supplement (Ensure Plus or equivalent) BID for at least 1 month.  - recommend serum vitamin D and zinc be checked outpatient during next lab draw.   - complete NFPE if patient unable to d/c home today.  - will route note to [place] [facility].   NUTRITION DIAGNOSIS:   Increased nutrient needs related to cancer and cancer related treatments, catabolic illness as evidenced by estimated needs.  GOAL:   Patient will meet greater than or equal to 90% of their needs  MONITOR:   PO intake, Supplement acceptance, Labs, Weight trends  REASON FOR ASSESSMENT:   Malnutrition Screening Tool  ASSESSMENT:   76 y.o. male with medical history of CAD, diastolic CHF, A.Fib on Xarelto, DLBCL with recent recurrence in the form of a skin lesion on L buttock/hip region recently saw oncology (see office note from 5/18). Patient presented to the ED on 02/24/22 due to AMS and was found to have a UTI. He was discharged home from the ED with abx. He returned to the ED on 5/27 with ongoing AMS. In the ED on 5/27 repeat UA indicated UTI. Patient was admitted for AMS. MRI brain completed 5/28 which did not show any indications of metastatic disease.  Patient sitting up in the chair. He was noted to have altered mental status on admission but is now a/o x4. No visitors present at the time of RD visit. RN present at bedside.   Flow sheet documentation indicates he ate 100% of breakfast and 100% of lunch yesterday (total of 1179 kcal and 49 grams protein), dinner intake not documented, and 100% of breakfast this AM (490 kcal and 22 grams protein).  Patient shares that he is going home. RN confirms. Patient is very excited about this. He is very  complimentary of the food and room service set up.  He denies any nutrition-related questions or concerns for once he returns home. He confirms that he has been losing weight. Unable to obtain further information at this time but encouraged patient to let RN know if he would like RD to return prior to him going home.  Weight on 5/27 was 251 lb and weight on 4/17 was 274 lb. This indicates 23 lb weight loss (8.4% body weight) in ~5 weeks; significant.    Labs reviewed; BUN: 30 mg/dl, creatinine: 1.51 mg/dl, GFR: 48 ml/min.  Medications reviewed; 325 mg ferrous sulfate BID, 100 mcg oral synthroid/day, 400 mg mag-ox/day, 1 tablet multivitamin with minerals/day, 80 mg oral protonix/day, 17 g miralax/day, 1 tablet senokot BID.    NUTRITION - FOCUSED PHYSICAL EXAM:  Deferred.   Diet Order:   Diet Order             Diet Heart Room service appropriate? Yes; Fluid consistency: Thin  Diet effective now                   EDUCATION NEEDS:   Not appropriate for education at this time  Skin:  Skin Assessment: Skin Integrity Issues: Skin Integrity Issues:: Other (Comment) Other: full thickness lesion d/t lymphoma mass to L buttock/hip region  Last BM:  5/28 per nursing flow sheet  Height:   Ht Readings from Last 1 Encounters:  03/06/22 '6\' 4"'$  (  1.93 m)    Weight:   Wt Readings from Last 1 Encounters:  03/06/22 113.8 kg    BMI:  Body mass index is 30.54 kg/m.  Estimated Nutritional Needs:  Kcal:  2350-2600 kcal Protein:  115-130 grams Fluid:  >/= 2.5 L/day     Joseph Matin, MS, RD, LDN Registered Dietitian II Inpatient Clinical Nutrition RD pager # and on-call/weekend pager # available in [facility]

## 2022-03-08 NOTE — Discharge Summary (Signed)
Physician Discharge Summary  [name] MVH:[id number] DOB: 1945-11-07 DOA: 03/06/2022  PCP: Cassandria Anger, MD  Admit date: 03/06/2022 Discharge date: 03/08/2022  Admitted From: Home Disposition:  Home   Recommendations for Outpatient Follow-up:  Follow up with PCP in 1 week Follow up with Oncology Dr. Irene Limbo   Discharge Condition: Stable CODE STATUS: Full  Diet recommendation:  Diet Orders (From admission, onward)     Start     Ordered   03/08/22 0000  Diet - low sodium heart healthy        03/08/22 1141   03/06/22 2125  Diet Heart Room service appropriate? Yes; Fluid consistency: Thin  Diet effective now       Question Answer Comment  Room service appropriate? Yes   Fluid consistency: Thin      03/06/22 2124           Brief/Interim Summary: From H&P by Dr. Alcario Drought: "[name] is a 76 y.o. male with medical history significant of CAD, diastolic CHF, A.Fib on Xarelto, DLBCL with recent recurrence in the form of a skin lesion on buttock recently saw oncology (see office note from 5/18).  Prior recurrent UTIs requring chronic ABx previously.   Pt with recent confusion / AMS, seen in ED on 5/17; diagnosed with UTI, put on bactrim and discharged home.   Culture grew out pan-sensitive E.coli.   CT head at that time was neg for acute findings.   Confusion persistent / recurrent, back to ED today.   EDP spoke with onc who recd MRI brain to r/o CNS lymphoma.   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 No skull base erosion or intracranial extension."  CT abdomen pelvis revealed "Mild increase in size of the previously demonstrated right lower lobe hypermetabolic mass with an interval small satellite nodule posteriorly. This is concerning for a primary lung neoplasm or lymphoma involving the lung." Oncology was consulted for further recommendations.   Patient was initially treated for UTI with Rocephin.  Urine culture resulted with Pseudomonas and antibiotics switched to cefepime and subsequently Cipro for discharge home.  Blood cultures were negative to date.  His encephalopathy resolved prior to discharge.  Discharge Diagnoses:   Principal Problem:   UTI (urinary tract infection) Active Problems:   Acute metabolic encephalopathy   Diffuse large B-cell lymphoma (HCC)   Lung mass   History of recurrent UTIs   Paroxysmal atrial fibrillation (HCC)   Stage 3a chronic kidney disease (CKD) (HCC)   Essential hypertension   Chronic diastolic heart failure ([place])   Hypothyroidism    Discharge Instructions  Discharge Instructions     Call MD for:  extreme fatigue   Complete by: As directed    Call MD for:  hives   Complete by: As directed    Call MD for:  persistant dizziness or light-headedness   Complete by: As directed    Call MD for:  persistant nausea and vomiting   Complete by: As directed    Call MD for:  severe uncontrolled pain   Complete by: As directed    Call MD for:  temperature >100.4   Complete by: As directed    Diet - low sodium heart healthy   Complete by: As directed    Discharge instructions   Complete by: As directed    You were cared for by a hospitalist during your hospital stay. If  you have any questions about your discharge medications or the care you received while you were in the hospital after you are discharged, you can call the unit and ask to speak with the hospitalist on call if the hospitalist that took care of you is not available. Once you are discharged, your primary care physician will handle any further medical issues. Please note that NO REFILLS for any discharge medications will be authorized once you are discharged, as it is imperative that you return to your primary care physician (or establish a relationship with a primary care physician if you do not have one) for your aftercare needs so  that they can reassess your need for medications and monitor your lab values.   Discharge wound care:   Complete by: As directed    Apply Vaseline gauze to left hip wound Q day, then cover with ABD pad and tape.   Increase activity slowly   Complete by: As directed       Allergies as of 03/08/2022       Reactions   Short Ragweed Pollen Ext Cough        Medication List     TAKE these medications    acetaminophen 500 MG tablet Commonly known as: TYLENOL Take 1,000 mg by mouth daily as needed for moderate pain.   albuterol 108 (90 Base) MCG/ACT inhaler Commonly known as: VENTOLIN HFA Inhale 2 puffs into the lungs every 6 (six) hours as needed for wheezing or shortness of breath.   alfuzosin 10 MG 24 hr tablet Commonly known as: UROXATRAL TAKE 1 TABLET AT BEDTIME What changed: how to take this   amoxicillin 500 MG capsule Commonly known as: AMOXIL Take 2,000 mg by mouth daily as needed (dental appointments).   aspirin EC 81 MG tablet Take 81 mg by mouth daily.   atorvastatin 80 MG tablet Commonly known as: LIPITOR Take 80 mg by mouth daily. What changed: Another medication with the same name was removed. Continue taking this medication, and follow the directions you see here.   buPROPion 300 MG 24 hr tablet Commonly known as: WELLBUTRIN XL TAKE 1 TABLET EVERY DAY WITH BREAKFAST   CALCIUM CITRATE + D PO Take 1 tablet by mouth 2 (two) times daily. '1200mg'$  of Calcium and 1000units vitamin D3   ciprofloxacin 500 MG tablet Commonly known as: Cipro Take 1 tablet (500 mg total) by mouth 2 (two) times daily for 10 days.   clonazePAM 1 MG tablet Commonly known as: KLONOPIN Take 1 tablet (1 mg total) by mouth every 6 (six) hours as needed. for anxiety What changed: See the new instructions.   denosumab 60 MG/ML Sosy injection Commonly known as: PROLIA Inject 60 mg as directed every 6 (six) months. Last injection: Feb 2020 Next injection: August 2020   diltiazem 240  MG 24 hr capsule Commonly known as: CARDIZEM CD TAKE 1 CAPSULE EVERY DAY   DULoxetine 60 MG capsule Commonly known as: CYMBALTA TAKE 1 CAPSULE TWICE DAILY   EPINEPHrine 0.3 mg/0.3 mL Soaj injection Commonly known as: EPI-PEN Inject 0.3 mg into the muscle as needed for anaphylaxis.   ezetimibe 10 MG tablet Commonly known as: ZETIA TAKE 1 TABLET EVERY DAY   ferrous sulfate 325 (65 FE) MG tablet Take 325 mg by mouth 2 (two) times daily with a meal.   hydrochlorothiazide 12.5 MG tablet Commonly known as: HYDRODIURIL TAKE 1 TABLET (12.5 MG TOTAL) BY MOUTH DAILY.   HYDROcodone-acetaminophen 7.5-325 MG tablet Commonly known as: NORCO  Take 1 tablet by mouth every 6 (six) hours as needed for severe pain.   isosorbide mononitrate 30 MG 24 hr tablet Commonly known as: IMDUR TAKE 1 TABLET (30 MG TOTAL) BY MOUTH DAILY.   levothyroxine 100 MCG tablet Commonly known as: SYNTHROID Take 100 mcg by mouth daily before breakfast.   loratadine 10 MG tablet Commonly known as: CLARITIN Take 10 mg by mouth daily.   losartan 50 MG tablet Commonly known as: COZAAR TAKE 1 TABLET EVERY DAY   Magnesium 500 MG Caps Take 1 capsule (500 mg total) by mouth daily. What changed: when to take this   metoprolol tartrate 25 MG tablet Commonly known as: LOPRESSOR Take 1 tablet (25 mg total) by mouth 2 (two) times daily.   multivitamin with minerals Tabs tablet Take 1 tablet by mouth daily. Men's One-A-Day 50+   mupirocin ointment 2 % Commonly known as: BACTROBAN On leg wound w/dressing change qd or bid What changed:  how much to take how to take this when to take this reasons to take this additional instructions   Myrbetriq 25 MG Tb24 tablet Generic drug: mirabegron ER TAKE 1 TABLET EVERY DAY   nitroGLYCERIN 0.4 MG SL tablet Commonly known as: NITROSTAT Place 0.4 mg under the tongue every 5 (five) minutes as needed for chest pain. x3 doses as needed for chest pain   omeprazole 40 MG  capsule Commonly known as: PRILOSEC TAKE 1 CAPSULE EVERY DAY BEFORE BREAKFAST   Potassium Citrate 15 MEQ (1620 MG) Tbcr TAKE 1 TABLET TWICE DAILY   PROBIOTIC PO Take 1 capsule by mouth daily with breakfast.   rivaroxaban 10 MG Tabs tablet Commonly known as: Xarelto Take 1 tablet (10 mg total) by mouth daily.   Skyrizi Pen 150 MG/ML Soaj Generic drug: Risankizumab-rzaa Inject 150 mg into the skin. Every 4 Months   solifenacin 10 MG tablet Commonly known as: VESICARE Take 10 mg by mouth daily.   testosterone enanthate 200 MG/ML injection Commonly known as: DELATESTRYL INJECT 1ML ('200MG'$ ) INTO THE MUSCLE EVERY 14 DAYS. DISCARD VIAL AFTER 28 DAYS.   tiZANidine 4 MG tablet Commonly known as: ZANAFLEX TAKE 1 TABLET EVERY 6 HOURS AS NEEDED FOR MUSCLE SPASM(S)   vitamin C 500 MG tablet Commonly known as: ASCORBIC ACID Take 500 mg by mouth daily.   VITAMIN D-3 PO Take 2,000 Units by mouth 2 (two) times daily.               Discharge Care Instructions  (From admission, onward)           Start     Ordered   03/08/22 0000  Discharge wound care:       Comments: Apply Vaseline gauze to left hip wound Q day, then cover with ABD pad and tape.   03/08/22 1141            Follow-up Information     Plotnikov, Evie Lacks, MD Follow up.   Specialty: Internal Medicine Contact information: [place] [address] [phone]         Brunetta Genera, MD Follow up.   Specialties: Hematology, Oncology Contact information: [place] [address] [phone]                Allergies  Allergen Reactions   Short Ragweed Pollen Ext Cough    Consultations: Oncology    Procedures/Studies: CT Head Wo Contrast  Result Date: 02/24/2022 CLINICAL DATA:  Head trauma. EXAM: CT HEAD  WITHOUT CONTRAST TECHNIQUE: Contiguous axial images were obtained from the base of the skull through the vertex without intravenous  contrast. RADIATION DOSE REDUCTION: This exam was performed according to the departmental dose-optimization program which includes automated exposure control, adjustment of the mA and/or kV according to patient size and/or use of iterative reconstruction technique. COMPARISON:  Head CT dated 11/08/2018. FINDINGS: Brain: Mild age-related atrophy and chronic microvascular ischemic changes. There is no acute intracranial hemorrhage. No mass effect or midline shift. No extra-axial fluid collection. Vascular: No hyperdense vessel or unexpected calcification. Skull: Normal. Negative for fracture or focal lesion. Sinuses/Orbits: The visualized paranasal sinuses and the left mastoid air cells are clear. Mild right mastoid effusions. Other: None IMPRESSION: 1. No acute intracranial pathology. 2. Mild age-related atrophy and chronic microvascular ischemic changes. Electronically Signed   By: Anner Crete M.D.   On: 02/24/2022 20:35   MR BRAIN W WO CONTRAST  Result Date: 03/07/2022 CLINICAL DATA:  Brain metastases suspected EXAM: MRI HEAD WITHOUT AND WITH CONTRAST TECHNIQUE: Multiplanar, multiecho pulse sequences of the brain and surrounding structures were obtained without and with intravenous contrast. CONTRAST:  20m GADAVIST GADOBUTROL 1 MMOL/ML IV SOLN COMPARISON:  PET CT from 5 days ago FINDINGS: Brain: No enhancement or swelling to suggest metastatic disease. Small FLAIR hyperintensities in the cerebral white matter attributed to chronic small vessel ischemia. No acute infarct, hydrocephalus, or collection. Cerebral volume loss which is generalized. Vascular: Slow flow in the right transverse and sigmoid dural sinuses when comparing T2 and postcontrast imaging, attributed to tumoral compression in the right neck. Skull and upper cervical spine: No skull base erosion or visible bony metastasis. Sinuses/Orbits: Right mastoid opacification associated with a nasopharyngeal mass which is right eccentric. Other:  Nasopharyngeal mass crossing midline but bulky towards the right with length of 6 cm and thickness of 18 mm. There is contact of the carotid and parapharyngeal spaces on the right. No evidence of intracranial extension IMPRESSION: 1. 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 No skull base erosion or intracranial extension. 2. Right mastoid effusion. Electronically Signed   By: JJorje GuildM.D.   On: 03/07/2022 11:53   CT ABDOMEN PELVIS W CONTRAST  Result Date: 03/06/2022 CLINICAL DATA:  Urinary frequency. Undergoing treatment for urinary tract infection without improvement. Clinical concern for renal mass or abscess. Undergoing chemotherapy for diffuse large B cell lymphoma. EXAM: CT ABDOMEN AND PELVIS WITH CONTRAST TECHNIQUE: Multidetector CT imaging of the abdomen and pelvis was performed using the standard protocol following bolus administration of intravenous contrast. RADIATION DOSE REDUCTION: This exam was performed according to the departmental dose-optimization program which includes automated exposure control, adjustment of the mA and/or kV according to patient size and/or use of iterative reconstruction technique. CONTRAST:  1038mOMNIPAQUE IOHEXOL 300 MG/ML  SOLN COMPARISON:  PET-CT dated 03/02/2022 and abdomen and pelvis CT dated 09/05/2019. FINDINGS: Lower chest: The 4.3 x 3.9 cm right lower lobe lung mass seen on 03/02/2022 currently measures 4.7 x 4.4 cm on image number 15/4. There is interval visualization of a 5 mm similar appearing nodule adjacent to this mass posteriorly and superiorly on image number 13/4. There is also bronchial mucous plugging at the posterior, inferior aspect of the mass, without significant change. The heart remains mildly enlarged. Minimal pericardial effusion with a maximum thickness of 6 mm. Atheromatous calcifications, including the coronary arteries and aorta. Hepatobiliary: Unremarkable liver. Mild sludge or  noncalcified gallstones in the dependent portion of the gallbladder. No gallbladder  wall thickening or pericholecystic fluid. Pancreas: Moderate diffuse pancreatic atrophy. Spleen: Normal in size without focal abnormality. Adrenals/Urinary Tract: Low-density right adrenal nodule with medium density thin internal septations in the superior aspect of the right adrenal gland, measuring 2.9 x 1.5 cm on coronal image number 47, without significant change since 09/05/2019. No increased activity on the recent PET-CT. Unremarkable left adrenal gland. Tiny lower pole right renal calculus. 4 mm mid left renal calculus. Small mid left renal cyst. No bladder or ureteral calculi no hydronephrosis. The distal right ureter is obscured by streak artifacts from patient's right hip prosthesis. Stomach/Bowel: Distended rectum containing stool and gas. Moderate stool throughout the remainder of the colon. Unremarkable stomach and small bowel. The appendix is not visualized and surgically absent by history. Vascular/Lymphatic: Atheromatous arterial calcifications without aneurysm. Enlarged, recently hypermetabolic left inguinal lymph node with a short axis diameter 19 mm on image number 98/2. Multiple additional enlarged left pelvic lymph nodes, including a common femoral node with a short axis diameter of 31 mm on image number 84/2 and no obturator node with a short axis diameter of 27 mm on image number 81/2. Reproductive: Prostate is unremarkable. Other: Small to moderate-sized ventral hernia containing fat small to moderate-sized infraumbilical hernia containing a small to moderate-sized supraumbilical hernia containing fat without change. Small left inguinal hernia containing fat. Musculoskeletal: Right hip prosthesis with associated streak artifact. Severe L1 vertebral compression deformity with bony retropulsion causing moderate to marked canal stenosis without significant change. Associated acute kyphosis without significant  change. No acute fractures or acute subluxations. Mild anterior spur formation at multiple levels in the lower thoracic spine and moderate to large anterior spurs at the anterior T12 and L2 levels on both sides of the severely compressed L1 vertebra. Stable anterior subluxation of the lumbar spine inferior to the L1 fracture relative to the spine above the fracture. Marked right psoas, iliacus and iliopsoas muscle atrophy. IMPRESSION: 1. No renal mass or abscess. 2. Small, nonobstructing calculus in each kidney. 3. Mild increase in size of the previously demonstrated right lower lobe hypermetabolic mass with an interval small satellite nodule posteriorly. This is concerning for a primary lung neoplasm or lymphoma involving the lung. 4. Recently demonstrated left pelvic and inguinal adenopathy, most likely due to the patient's known lymphoma. 5. Stable small to moderate-sized supraumbilical hernia containing herniated fat. 6. Stable right adrenal probable adenoma. Electronically Signed   By: Claudie Revering M.D.   On: 03/06/2022 16:39   NM PET Image Restag (PS) Skull Base To Thigh  Result Date: 03/02/2022 CLINICAL DATA:  Follow-up treatment strategy for diffuse large B-cell lymphoma. EXAM: NUCLEAR MEDICINE PET SKULL BASE TO THIGH TECHNIQUE: 0.8 mCi F-18 FDG was injected intravenously. Full-ring PET imaging was performed from the skull base to thigh after the radiotracer. CT data was obtained and used for attenuation correction and anatomic localization. Fasting blood glucose: 107 mg/dl COMPARISON:  PET-CT dated November 22, 2018; PET-CT dated August 22, 2018 FINDINGS: Mediastinal blood pool activity: SUV max 2.8 Liver activity: SUV max 3.8 NECK: New marked hypermetabolic soft tissue thickening of the right posterior nasopharynx with SUV max of 20.5. Large lymph node located anterior to the right sternocleidomastoid muscle unchanged in size, measuring 2.2 cm in short axis, and demonstrates similar mild FDG uptake,  SUV max of 2.6, previously 2.4. New hypermetabolic subcentimeter right level IIb lymph node measuring 5 mm on series 4, image 34. Incidental CT findings: none CHEST: New hypermetabolic right lower lobe lung mass measuring 3.9  x 4.3 cm with SUV max of 12.4. Incidental CT findings: Left main and three-vessel coronary artery calcifications. Atherosclerotic disease of the thoracic aorta. Right chest wall port with tip in the mid SVC. ABDOMEN/PELVIS: Enlarged hypermetabolic left pelvic and left inguinal lymph nodes. Reference left pelvic sidewall lymph node measuring 2.8 cm in short axis on series 4, image 190 with SUV max of 33.5. Enlarged left inguinal lymph node measuring 1.9 cm in short axis on series 4, image 207 with SUV max of hypermetabolic soft tissue nodules and masses of the left buttocks. Largest measures 7.7 x 4.1 cm on series 4, image 189 with SUV max of 18.5. Incidental CT findings: Simple appearing cyst of the left kidney, no further follow-up imaging is recommended for this lesion. Nonobstructing stone of the left kidney. Atherosclerotic disease of the abdominal aorta. Moderate fat containing umbilical hernia. Air is seen in the urinary bladder. SKELETON: No suspicious focal uptake to suggest osseous metastatic disease. Incidental CT findings: Prior total left hip arthroplasty with cerclage wires and surrounding hypermetabolic activity, similar to prior exam. Unchanged severe compression deformity at L1. Spinous process fractures with associated FDG uptake extending from approximately the T8-T1 levels. IMPRESSION: 1. New marked hypermetabolic soft tissue thickening of the right posterior nasopharynx. 2. New hypermetabolic subcentimeterright level IIb lymph node. 3. New hypermetabolic right lower lobe lung mass. 4. New enlarged and hypermetabolic left pelvic and left inguinal lymph nodes. 5. New hypermetabolic soft tissue nodules and masses of the left buttocks. 6. Enlarged lymph node located anterior to  the right sternocleidomastoid muscle is unchanged in size and demonstrates similar FDG uptake, likely treated disease. 7. Spinous process fractures with associated FDG uptake extending from approximately the T8-T1 levels. Correlate for history of recent trauma. 8. Air is seen in the urinary bladder, possibly due to recent instrumentation or infection. Correlate with urinalysis. Electronically Signed   By: Yetta Glassman M.D.   On: 03/02/2022 15:53   DG Chest Port 1 View  Result Date: 03/06/2022 CLINICAL DATA:  Possible sepsis. EXAM: PORTABLE CHEST 1 VIEW COMPARISON:  10/13/2021. FINDINGS: Cardiac silhouette is normal in size. No mediastinal or hilar masses. Right anterior chest wall Port-A-Cath is stable, tip in the mid superior vena cava. Chronic prominence of the interstitial markings bilaterally. Lungs otherwise clear. No pleural effusion or pneumothorax. Skeletal structures are grossly intact. IMPRESSION: No active disease. Electronically Signed   By: Lajean Manes M.D.   On: 03/06/2022 15:04       Discharge Exam: Vitals:   03/07/22 2152 03/08/22 0649  BP: 121/69 128/65  Pulse: 71 69  Resp:  18  Temp:  (!) 97.5 F (36.4 C)  SpO2:  97%    General: Pt is alert, awake, not in acute distress Cardiovascular: RRR, S1/S2 +, no edema Respiratory: CTA bilaterally, no wheezing, no rhonchi, no respiratory distress, no conversational dyspnea  Abdominal: Soft, NT, ND, bowel sounds + Extremities: no edema, no cyanosis Psych: Normal mood and affect, stable judgement and insight     The results of significant diagnostics from this hospitalization (including imaging, microbiology, ancillary and laboratory) are listed below for reference.     Microbiology: Recent Results (from the past 240 hour(s))  Blood Culture (routine x 2)     Status: None (Preliminary result)   Collection Time: 03/06/22  2:46 PM   Specimen: BLOOD LEFT ARM  Result Value Ref Range Status   Specimen Description   Final     BLOOD LEFT ARM Performed at [facility]  [facility], [address], [place], [place] [address]    Special Requests   Final    BOTTLES DRAWN AEROBIC AND ANAEROBIC Blood Culture adequate volume Performed at [facility], [address] [place], [place] [address]    Culture   Final    NO GROWTH 2 DAYS Performed at [facility], [place] [address], [place], [place] [address]    Report Status PENDING  Incomplete  Blood Culture (routine x 2)     Status: None (Preliminary result)   Collection Time: 03/06/22  2:46 PM   Specimen: BLOOD  Result Value Ref Range Status   Specimen Description   Final    BLOOD RIGHT ANTECUBITAL Performed at [facility], [address], [place], [place] [address]    Special Requests   Final    BOTTLES DRAWN AEROBIC AND ANAEROBIC Blood Culture adequate volume Performed at [facility], [address] [place], [place] [address]    Culture   Final    NO GROWTH 2 DAYS Performed at [facility], [place] [address] [place], [place] [address]    Report Status PENDING  Incomplete  Urine Culture     Status: Abnormal   Collection Time: 03/06/22  2:46 PM   Specimen: In/Out Cath Urine  Result Value Ref Range Status   Specimen Description   Final    IN/OUT CATH URINE Performed at [facility], [address], [place],  [address]    Special Requests   Final    NONE Performed at [facility], [address], [place],  [address]    Culture 70,000 COLONIES/mL PSEUDOMONAS AERUGINOSA (A)  Final   Report Status 03/08/2022 FINAL  Final   Organism ID, Bacteria PSEUDOMONAS AERUGINOSA (A)  Final      Susceptibility   Pseudomonas aeruginosa - MIC*    CEFTAZIDIME 4 SENSITIVE Sensitive     CIPROFLOXACIN 0.5 SENSITIVE Sensitive     GENTAMICIN 8 INTERMEDIATE Intermediate     IMIPENEM 2 SENSITIVE Sensitive     PIP/TAZO <=4 SENSITIVE Sensitive     CEFEPIME 4  SENSITIVE Sensitive     * 70,000 COLONIES/mL PSEUDOMONAS AERUGINOSA     Labs: BNP (last 3 results) Recent Labs    05/19/21 0845  BNP 193.7*   Basic Metabolic Panel: Recent Labs  Lab 03/06/22 1446 03/07/22 0532 03/08/22 0438  NA 135 133* 138  K 3.7 3.7 3.7  CL 95* 98 104  CO2 '28 25 26  '$ GLUCOSE 85 82 119*  BUN 28* 22 30*  CREATININE 1.63* 1.38* 1.51*  CALCIUM 9.5 8.6* 9.2   Liver Function Tests: Recent Labs  Lab 03/06/22 1446  AST 36  ALT 28  ALKPHOS 71  BILITOT 1.4*  PROT 7.1  ALBUMIN 3.8   No results for input(s): LIPASE, AMYLASE in the last 168 hours. No results for input(s): AMMONIA in the last 168 hours. CBC: Recent Labs  Lab 03/06/22 1446 03/07/22 0532  WBC 7.0 7.5  NEUTROABS 5.4  --   HGB 17.1* 16.6  HCT 51.0 48.7  MCV 92.1 93.5  PLT 164 152   Cardiac Enzymes: No results for input(s): CKTOTAL, CKMB, CKMBINDEX, TROPONINI in the last 168 hours. BNP: Invalid input(s): POCBNP CBG: Recent Labs  Lab 03/02/22 1325  GLUCAP 107*   D-Dimer No results for input(s): DDIMER in the last 72 hours. Hgb A1c No results for input(s): HGBA1C in the last 72 hours. Lipid Profile No results for input(s): CHOL, HDL,  LDLCALC, TRIG, CHOLHDL, LDLDIRECT in the last 72 hours. Thyroid function studies No results for input(s): TSH, T4TOTAL, T3FREE, THYROIDAB in the last 72 hours.  Invalid input(s): FREET3 Anemia work up No results for input(s): VITAMINB12, FOLATE, FERRITIN, TIBC, IRON, RETICCTPCT in the last 72 hours. Urinalysis    Component Value Date/Time   COLORURINE YELLOW 03/06/2022 1320   APPEARANCEUR HAZY (A) 03/06/2022 1320   LABSPEC 1.015 03/06/2022 1320   PHURINE 7.0 03/06/2022 1320   GLUCOSEU NEGATIVE 03/06/2022 1320   GLUCOSEU NEGATIVE 10/02/2018 1235   HGBUR NEGATIVE 03/06/2022 1320   BILIRUBINUR NEGATIVE 03/06/2022 1320   KETONESUR NEGATIVE 03/06/2022 1320   PROTEINUR TRACE (A) 03/06/2022 1320   UROBILINOGEN 0.2 10/02/2018 1235   NITRITE  NEGATIVE 03/06/2022 1320   LEUKOCYTESUR LARGE (A) 03/06/2022 1320   Sepsis Labs Invalid input(s): PROCALCITONIN,  WBC,  LACTICIDVEN Microbiology Recent Results (from the past 240 hour(s))  Blood Culture (routine x 2)     Status: None (Preliminary result)   Collection Time: 03/06/22  2:46 PM   Specimen: BLOOD LEFT ARM  Result Value Ref Range Status   Specimen Description   Final    BLOOD LEFT ARM Performed at [facility], [place] [address] [place], [place] [address]    Special Requests   Final    BOTTLES DRAWN AEROBIC AND ANAEROBIC Blood Culture adequate volume Performed at [facility], [address] [place], [place] [address]    Culture   Final    NO GROWTH 2 DAYS Performed at [facility], [place] [address], [place], [place] [address]    Report Status PENDING  Incomplete  Blood Culture (routine x 2)     Status: None (Preliminary result)   Collection Time: 03/06/22  2:46 PM   Specimen: BLOOD  Result Value Ref Range Status   Specimen Description   Final    BLOOD RIGHT ANTECUBITAL Performed at [facility], [address], [place], [place] [address]    Special Requests   Final    BOTTLES DRAWN AEROBIC AND ANAEROBIC Blood Culture adequate volume Performed at [facility], [address] [place], [place] [address]    Culture   Final    NO GROWTH 2 DAYS Performed at [facility], [place] [address] [place], [place] [address]    Report Status PENDING  Incomplete  Urine Culture     Status: Abnormal   Collection Time: 03/06/22  2:46 PM   Specimen: In/Out Cath Urine  Result Value Ref Range Status   Specimen Description   Final    IN/OUT CATH URINE Performed at [facility], [address], [place], [place] [address]    Special Requests   Final    NONE Performed at [facility], [place], [place] [address]    Culture 70,000 COLONIES/mL  PSEUDOMONAS AERUGINOSA (A)  Final   Report Status 03/08/2022 FINAL  Final   Organism ID, Bacteria PSEUDOMONAS AERUGINOSA (A)  Final      Susceptibility   Pseudomonas aeruginosa - MIC*    CEFTAZIDIME 4 SENSITIVE Sensitive     CIPROFLOXACIN 0.5 SENSITIVE Sensitive     GENTAMICIN 8 INTERMEDIATE Intermediate     IMIPENEM 2 SENSITIVE Sensitive     PIP/TAZO <=4 SENSITIVE Sensitive     CEFEPIME 4 SENSITIVE Sensitive     * 70,000 COLONIES/mL PSEUDOMONAS AERUGINOSA     Patient was seen and examined on the day of discharge and was found to  be in stable condition. Time coordinating discharge: 35 minutes including assessment and coordination of care, as well as examination of the patient.   SIGNED:  Dessa Phi, DO [facility] 03/08/2022, 11:42 AM

## 2022-03-08 NOTE — Discharge Instructions (Signed)

## 2022-03-08 NOTE — TOC CM/SW Note (Signed)
  Transition of Care [facility]) Screening Note   Patient Details  Name: [name] Date of Birth: 1946-05-10   Transition of Care [facility]) CM/SW Contact:    Ross Ludwig, LCSW Phone Number: 03/08/2022, 11:47 AM    Transition of Care Department [facility]) has reviewed patient and no TOC needs have been identified at this time. We will continue to monitor patient advancement through interdisciplinary progression rounds. If new patient transition needs arise, please place a TOC consult.

## 2022-03-08 NOTE — Consult Note (Addendum)
WOC Nurse Consult Note: Reason for Consult: Consult requested for left hip/posterior thigh lesion related to lymphoma, according to progress notes.  Wound type: Full thickness lesion, approx 5X5X.1cm, raised above skin level, dark purple-red, painful to touch.  Pinhole opening in the center draining mod amt old bloody drainage.  Dressing procedure/placement/frequency: Topical treatment will not be effective to promote healing and will be directed to decreasing adherence of dressing and absorbing drainage.  Orders provided for bedside nurses to perform as follows: Apply Vaseline gauze to left hip wound Q day, then cover with ABD pad and tape.  Notify primary team if wound develops excessive bleeding. Pt should follow-up with his oncologist for further plan of care after discharge.  Please re-consult if further assistance is needed.  Thank-you,  Julien Girt MSN, [place], [place], [place], [place]

## 2022-03-08 NOTE — Progress Notes (Signed)
Care resumed for this patient around 10 AM. Agree with previous assessment. Patient discharged home as ordered- husband at bedside and understands instructions as well. Paperwork printed and given to patient. No home needs at this time.

## 2022-03-11 ENCOUNTER — Encounter (HOSPITAL_BASED_OUTPATIENT_CLINIC_OR_DEPARTMENT_OTHER): Payer: Self-pay | Admitting: Cardiovascular Disease

## 2022-03-11 LAB — CULTURE, BLOOD (ROUTINE X 2)
Culture: NO GROWTH
Culture: NO GROWTH
Special Requests: ADEQUATE
Special Requests: ADEQUATE

## 2022-03-12 ENCOUNTER — Encounter: Payer: Self-pay | Admitting: Hematology

## 2022-03-15 ENCOUNTER — Other Ambulatory Visit: Payer: Self-pay | Admitting: Radiology

## 2022-03-15 DIAGNOSIS — C8330 Diffuse large B-cell lymphoma, unspecified site: Secondary | ICD-10-CM

## 2022-03-15 NOTE — H&P (Signed)
Chief Complaint: Patient was seen in consultation today for gluteal mass biopsy at the request of Brunetta Genera  Referring Physician(s): Brunetta Genera  Supervising Physician: Michaelle Birks  Patient Status: [facility] - Out-pt  History of Present Illness: [name] is a 76 y.o. male with past medical history significant for A-fib on Xarelto, localized prostatic hyperplasia with LUTS, CAD, CHF diffuse large B-cell lymphoma, GAD, GERD, Graves' disease status post thyroidectomy 1987, OSA on CPAP, HTN, HLD, IDA, plaque psoriasis, thrombocytopenia and renal calculi.  Patient had PET/CT scan that showed sizable lung mass in addition to lymphadenopathy in neck chest abdomen pelvis and left buttock lesion.  Patient refused suggestion for pulmonary biopsy due to Xarelto use.  Patient had shave biopsy from left lesion on left buttocks that showed relapse and large B-cell lymphoma.  Dr. Sullivan Lone has referred patient to IR for repeat core needle biopsy of left gluteal mass for additional molecular testing for large B-cell lymphoma.  PET CT 03/02/2022: IMPRESSION: 1. New marked hypermetabolic soft tissue thickening of the right posterior nasopharynx. 2. New hypermetabolic subcentimeterright level IIb lymph node. 3. New hypermetabolic right lower lobe lung mass. 4. New enlarged and hypermetabolic left pelvic and left inguinal lymph nodes. 5. New hypermetabolic soft tissue nodules and masses of the left buttocks. 6. Enlarged lymph node located anterior to the right sternocleidomastoid muscle is unchanged in size and demonstrates similar FDG uptake, likely treated disease. 7. Spinous process fractures with associated FDG uptake extending from approximately the T8-T1 levels. Correlate for history of recent trauma. 8. Air is seen in the urinary bladder, possibly due to recent instrumentation or infection. Correlate with urinalysis.    Past Medical History:  Diagnosis Date    Anticoagulant long-term use    xarelto--- takes for AFib,  managed by pcp, Dr Alain Marion   Benign localized prostatic hyperplasia with lower urinary tract symptoms (LUTS)    CAD (coronary artery disease) cardiologist--  dr t. Oval Linsey   hx of known CAD obstruction w/ collaterals (cath done @ [facility] 12-31-2011) ;   last cardiac cath 08-13-2019  showed sig. 2V CAD involving proxLAD and CTO of the RCA/  chronic total occlusion midRCA w/ bridging and L>R collaterals   Diastolic CHF ([place])    dx [id number] hospital admission (followed by pcp)   Diffuse large B cell lymphoma [facility]) oncologist-- dr Irene Limbo--- in remission   dx 08/ 2019 -- bx left tonsill mass-- involving lymph nodes--- completed chemotherapy 10-12-2018   DJD (degenerative joint disease)    Dysthymic disorder    Elevated brain natriuretic peptide (BNP) level    Environmental allergies    GAD (generalized anxiety disorder)    GERD (gastroesophageal reflux disease)    History of falling    multiple times   History of Graves' disease    1987 s/p total thyroidectomy   History of kidney stones    History of scabies    07/ 2014  resolved   History of syncope    per D/C note in epic 02-21-2013 ?vasovagal   History of vertebral compression fracture    05/ 2014 --  L1, no surgical intervention   HLD (hyperlipidemia)    Hyperlipidemia    Hypertension    Hypogonadism in male    IDA (iron deficiency anemia)    Lumbago    OA (osteoarthritis)    OSA on CPAP    cpap machine-settings 17   Osteoporosis    Severe   Persistent atrial fibrillation ([place])  cardiologist--- dr Alfonso Patten. [place]   Plaque psoriasis    dermatologist--- dr Harriett Sine--- currently taking otezla   Post-surgical hypothyroidism    followed by pcp---  s/p total thyroidectomy for graves disease in 1987   Pure hypercholesterolemia 02/10/2021   Renal calculus, right    Thrombocytopenia ([place])    Unsteady gait    uses walker   Wears hearing aid in both ears     Past  Surgical History:  Procedure Laterality Date   APPENDECTOMY  2011 approx.    CARDIAC CATHETERIZATION  12-31-2011   dr j. Beatrix Fetters '@[facility]'$    normal LVF w/ multivessel CAD,  occluded RCA w/ colleterals   CATARACT EXTRACTION W/ INTRAOCULAR LENS  IMPLANT, BILATERAL  2016 approx.   COLONOSCOPY     CYSTOSCOPY WITH RETROGRADE PYELOGRAM, URETEROSCOPY AND STENT PLACEMENT Left 11/07/2017   Procedure: CYSTOSCOPY WITH RETROGRADE PYELOGRAM, URETEROSCOPY AND STENT PLACEMENT;  Surgeon: Cleon Gustin, MD;  Location: [facility];  Service: Urology;  Laterality: Left;   CYSTOSCOPY WITH RETROGRADE PYELOGRAM, URETEROSCOPY AND STENT PLACEMENT Right 10/22/2019   Procedure: CYSTOSCOPY WITH RETROGRADE PYELOGRAM, URETEROSCOPY AND STENT PLACEMENT;  Surgeon: Cleon Gustin, MD;  Location: [facility];  Service: Urology;  Laterality: Right;   CYSTOSCOPY/URETEROSCOPY/HOLMIUM LASER/STENT PLACEMENT Right 03/11/2017   Procedure: CYSTOSCOPY/URETEROSCOPYSTENT PLACEMENT right ureter retrograde pylegram;  Surgeon: Cleon Gustin, MD;  Location[facility];  Service: Urology;  Laterality: Right;   HARDWARE REMOVAL  10/05/2012   Procedure: HARDWARE REMOVAL;  Surgeon: [name], MD;  [facility]: [facility];  Service: Orthopedics;  Laterality: Right;  REMOVING  STRYKER  GAMMA NAIL   HARDWARE REMOVAL Right 07/03/2013   Procedure: HARDWARE REMOVAL RIGHT TIBIA ;  Surgeon: Rozanna Box, MD;  Location: [place];  Service: Orthopedics;  Laterality: Right;   HIP CLOSED REDUCTION Right 10/15/2013   Procedure: CLOSED MANIPULATION HIP;  Surgeon: [name], MD;  [facility]: [facility];  Service: Orthopedics;  Laterality: Right;   HOLMIUM LASER APPLICATION Right 12/10/9516   Procedure: HOLMIUM LASER APPLICATION;  Surgeon: Cleon Gustin, MD;  [facility];  Service: Urology;  Laterality: Right;   HOLMIUM LASER APPLICATION Left 8/41/6606   Procedure: HOLMIUM LASER APPLICATION;  Surgeon: Cleon Gustin, MD;  [facility];   Service: Urology;  Laterality: Left;   HOLMIUM LASER APPLICATION Left 3/0/1601   Procedure: HOLMIUM LASER APPLICATION;  Surgeon: Cleon Gustin, MD;  [facility];  Service: Urology;  Laterality: Left;   HOLMIUM LASER APPLICATION Right 0/93/2355   Procedure: HOLMIUM LASER APPLICATION;  Surgeon: Cleon Gustin, MD;  Location: [facility];  Service: Urology;  Laterality: Right;   INCISION AND DRAINAGE HIP Right 11/16/2013   Procedure: IRRIGATION AND DEBRIDEMENT RIGHT HIP;  Surgeon: [name], MD;  [facility];  Service: Orthopedics;  Laterality: Right;   INTRAVASCULAR PRESSURE WIRE/FFR STUDY N/A 08/13/2019   Procedure: INTRAVASCULAR PRESSURE WIRE/FFR STUDY;  Surgeon: Nelva Bush, MD;  Location: [place] [facility];  Service: Cardiovascular;  Laterality: N/A;   IR IMAGING GUIDED PORT INSERTION  06/22/2018   IR NEPHROSTOMY PLACEMENT LEFT  03/28/2019   IR URETERAL STENT LEFT NEW ACCESS W/O SEP NEPHROSTOMY CATH  10/24/2017   IR URETERAL STENT LEFT NEW ACCESS W/O SEP NEPHROSTOMY CATH  03/26/2019   NEPHROLITHOTOMY Right 02/08/2017   Procedure: NEPHROLITHOTOMY PERCUTANEOUS WITH SURGEON ACCESS;  Surgeon: Cleon Gustin, MD;  [facility];  Service: Urology;  Laterality: Right;   NEPHROLITHOTOMY Left 10/24/2017  Procedure: NEPHROLITHOTOMY PERCUTANEOUS;  Surgeon: Cleon Gustin, MD;  [facility];  Service: Urology;  Laterality: Left;   NEPHROLITHOTOMY Left 03/26/2019   Procedure: NEPHROLITHOTOMY PERCUTANEOUS;  Surgeon: Cleon Gustin, MD;  [facility];  Service: Urology;  Laterality: Left;  2 HRS   NEPHROLITHOTOMY Left 04/17/2019   Procedure: NEPHROLITHOTOMY PERCUTANEOUS;  Surgeon: Cleon Gustin, MD;  [facility];  Service: Urology;  Laterality: Left;  2 HRS   ORIF TIBIA FRACTURE Right 02/06/2013   Procedure: OPEN REDUCTION INTERNAL FIXATION (ORIF) TIBIA FRACTURE WITH IM ROD FIBULA;  Surgeon: Rozanna Box, MD;  Location: [place];   Service: Orthopedics;  Laterality: Right;   ORIF TIBIA FRACTURE Right 07/03/2013   Procedure: RIGHT TIBIA NON UNION REPAIR ;  Surgeon: Rozanna Box, MD;  Location: [place];  Service: Orthopedics;  Laterality: Right;   ORIF WRIST FRACTURE  10/02/2012   Procedure: OPEN REDUCTION INTERNAL FIXATION (ORIF) WRIST FRACTURE;  Surgeon: Roseanne Kaufman, MD;  [facility];  Service: Orthopedics;  Laterality: Right;  WITH   ANTIBIOTIC  CEMENT   ORIF WRIST FRACTURE Left 10/28/2013   Procedure: OPEN REDUCTION INTERNAL FIXATION (ORIF) WRIST FRACTURE with allograft;  Surgeon: Roseanne Kaufman, MD;  [facility];  Service: Orthopedics;  Laterality: Left;  DVR Plate   QUADRICEPS TENDON REPAIR Left 07/15/2017   Procedure: REPAIR QUADRICEP TENDON;  Surgeon: Frederik Pear, MD;  Location: [place];  Service: Orthopedics;  Laterality: Left;   RIGHT/LEFT HEART CATH AND CORONARY ANGIOGRAPHY N/A 08/13/2019   Procedure: RIGHT/LEFT HEART CATH AND CORONARY ANGIOGRAPHY;  Surgeon: Nelva Bush, MD;  Location: [place] [facility];  Service: Cardiovascular;  Laterality: N/A;   THYROIDECTOMY  02/1986   TOTAL HIP ARTHROPLASTY Right 03-16-2016   '@[facility]'$    TOTAL HIP REVISION  10/05/2012   Procedure: TOTAL HIP REVISION;  Surgeon: [name], MD;  [facility];  Service: Orthopedics;  Laterality: Right;  RIGHT TOTAL HIP REVISION   TOTAL HIP REVISION Right 09/17/2013   Procedure: REVISION RIGHT TOTAL HIP ARTHROPLASTY ;  Surgeon: [name], MD;  [facility];  Service: Orthopedics;  Laterality: Right;   TOTAL HIP REVISION Right 10/26/2013   Procedure: REVISION RIGHT TOTAL HIP ARTHROPLASTY;  Surgeon: [name], MD;  [facility];  Service: Orthopedics;  Laterality: Right;   TOTAL KNEE ARTHROPLASTY Bilateral right 03-15-2011;  left 06-30-2011   TOTAL KNEE REVISION Left 04/11/2017   Procedure: TOTAL KNEE REVISION PATELLA and TIBIA;  Surgeon: Frederik Pear, MD;  Location: [place];  Service: Orthopedics;  Laterality:  Left;    Allergies: Short ragweed pollen ext  Medications: Prior to Admission medications   Medication Sig Start Date End Date Taking? Authorizing Provider  acetaminophen (TYLENOL) 500 MG tablet Take 1,000 mg by mouth daily as needed for moderate pain.    [provider]  albuterol (VENTOLIN HFA) 108 (90 Base) MCG/ACT inhaler Inhale 2 puffs into the lungs every 6 (six) hours as needed for wheezing or shortness of breath.    [provider]  alfuzosin (UROXATRAL) 10 MG 24 hr tablet TAKE 1 TABLET AT BEDTIME Patient taking differently: 10 mg at bedtime. 03/19/21   McKenzie, Candee Furbish, MD  amoxicillin (AMOXIL) 500 MG capsule Take 2,000 mg by mouth daily as needed (dental appointments).    [provider]  aspirin EC 81 MG tablet Take 81 mg by mouth daily.    [provider]  atorvastatin (LIPITOR) 80 MG tablet Take 80 mg by  mouth daily.    [provider]  buPROPion (WELLBUTRIN XL) 300 MG 24 hr tablet TAKE 1 TABLET EVERY DAY WITH BREAKFAST 02/20/21   Plotnikov, Evie Lacks, MD  Calcium Citrate-Vitamin D (CALCIUM CITRATE + D PO) Take 1 tablet by mouth 2 (two) times daily. '1200mg'$  of Calcium and 1000units vitamin D3    [provider]  Cholecalciferol (VITAMIN D-3 PO) Take 2,000 Units by mouth 2 (two) times daily.     [provider]  ciprofloxacin (CIPRO) 500 MG tablet Take 1 tablet (500 mg total) by mouth 2 (two) times daily for 10 days. 03/08/22 03/18/22  Dessa Phi, DO  clonazePAM (KLONOPIN) 1 MG tablet Take 1 tablet (1 mg total) by mouth every 6 (six) hours as needed. for anxiety Patient taking differently: Take 1 mg by mouth every 6 (six) hours as needed for anxiety. 08/28/21   Biagio Borg, MD  denosumab (PROLIA) 60 MG/ML SOSY injection Inject 60 mg as directed every 6 (six) months. Last injection: Feb 2020 Next injection: August 2020 12/10/15   [provider]  diltiazem (CARDIZEM CD) 240 MG 24 hr capsule TAKE 1 CAPSULE EVERY  DAY 03/23/21   Plotnikov, Evie Lacks, MD  DULoxetine (CYMBALTA) 60 MG capsule TAKE 1 CAPSULE TWICE DAILY 03/23/21   Plotnikov, Evie Lacks, MD  EPINEPHrine 0.3 mg/0.3 mL IJ SOAJ injection Inject 0.3 mg into the muscle as needed for anaphylaxis. 07/31/21   [provider]  ezetimibe (ZETIA) 10 MG tablet TAKE 1 TABLET EVERY DAY 08/10/21   Skeet Latch, MD  ferrous sulfate 325 (65 FE) MG tablet Take 325 mg by mouth 2 (two) times daily with a meal.     [provider]  hydrochlorothiazide (HYDRODIURIL) 12.5 MG tablet TAKE 1 TABLET (12.5 MG TOTAL) BY MOUTH DAILY. 06/23/21   Loel Dubonnet, NP  HYDROcodone-acetaminophen (NORCO) 7.5-325 MG tablet Take 1 tablet by mouth every 6 (six) hours as needed for severe pain. 09/30/20   Plotnikov, Evie Lacks, MD  isosorbide mononitrate (IMDUR) 30 MG 24 hr tablet TAKE 1 TABLET (30 MG TOTAL) BY MOUTH DAILY. 08/10/21   Skeet Latch, MD  levothyroxine (SYNTHROID) 100 MCG tablet Take 100 mcg by mouth daily before breakfast.    [provider]  loratadine (CLARITIN) 10 MG tablet Take 10 mg by mouth daily.     [provider]  losartan (COZAAR) 50 MG tablet TAKE 1 TABLET EVERY DAY 06/18/21   Skeet Latch, MD  Magnesium 500 MG CAPS Take 1 capsule (500 mg total) by mouth daily. Patient taking differently: Take 500 mg by mouth every evening. 10/13/18   Brunetta Genera, MD  metoprolol tartrate (LOPRESSOR) 25 MG tablet Take 1 tablet (25 mg total) by mouth 2 (two) times daily. 08/18/21   Skeet Latch, MD  Multiple Vitamin (MULTIVITAMIN WITH MINERALS) TABS tablet Take 1 tablet by mouth daily. Men's One-A-Day 50+    [provider]  mupirocin ointment (BACTROBAN) 2 % On leg wound w/dressing change qd or bid Patient taking differently: Apply 1 application. topically 2 (two) times daily as needed (rash). 01/25/22   Plotnikov, Evie Lacks, MD  MYRBETRIQ 25 MG TB24 tablet TAKE 1 TABLET EVERY DAY 10/06/21   McKenzie, Candee Furbish,  MD  nitroGLYCERIN (NITROSTAT) 0.4 MG SL tablet Place 0.4 mg under the tongue every 5 (five) minutes as needed for chest pain. x3 doses as needed for chest pain    [provider]  omeprazole (PRILOSEC) 40 MG capsule TAKE 1 CAPSULE EVERY  DAY BEFORE BREAKFAST 06/18/21   Plotnikov, Evie Lacks, MD  Potassium Citrate 15 MEQ (1620 MG) TBCR TAKE 1 TABLET TWICE DAILY 03/19/21   McKenzie, Candee Furbish, MD  Probiotic Product (PROBIOTIC PO) Take 1 capsule by mouth daily with breakfast.    [provider]  rivaroxaban (XARELTO) 10 MG TABS tablet Take 1 tablet (10 mg total) by mouth daily. 06/29/21   Plotnikov, Evie Lacks, MD  SKYRIZI PEN 150 MG/ML SOAJ Inject 150 mg into the skin. Every 4 Months 01/22/22   [provider]  solifenacin (VESICARE) 10 MG tablet Take 10 mg by mouth daily. 08/24/21   [provider]  testosterone enanthate (DELATESTRYL) 200 MG/ML injection INJECT 1ML ('200MG'$ ) INTO THE MUSCLE EVERY 14 DAYS. DISCARD VIAL AFTER 28 DAYS. 09/22/21   Plotnikov, Evie Lacks, MD  tiZANidine (ZANAFLEX) 4 MG tablet TAKE 1 TABLET EVERY 6 HOURS AS NEEDED FOR MUSCLE SPASM(S) 06/29/21   Plotnikov, Evie Lacks, MD  vitamin C (ASCORBIC ACID) 500 MG tablet Take 500 mg by mouth daily.    [provider]     Family History  Problem Relation Age of Onset   CAD Father 72   Asthma Father    Alcohol abuse Father    Arthritis Mother    Alcohol abuse Sister     Social History   Socioeconomic History   Marital status: Married    Spouse name: Not on file   Number of children: Not on file   Years of education: Not on file   Highest education level: Not on file  Occupational History   Not on file  Tobacco Use   Smoking status: Former    Packs/day: 1.00    Years: 50.00    Pack years: 50.00    Types: Cigarettes    Quit date: 01/12/2012    Years since quitting: 10.1   Smokeless tobacco: Never  Vaping Use   Vaping Use: Never used  Substance and Sexual Activity   Alcohol use: Not  Currently    Comment: occasional-social   Drug use: Never   Sexual activity: Yes  Other Topics Concern   Not on file  Social History Narrative   [place] 2.5 months, went home Feb 21st slipped and fell on back and developed.  Home PT/OT.  Just started outpatient physical therapy.  Friday night, misstepped.     Social Determinants of Health   Financial Resource Strain: Low Risk    Difficulty of Paying Living Expenses: Not hard at all  Food Insecurity: No Food Insecurity   Worried About Charity fundraiser in the Last Year: Never true   [place] in the Last Year: Never true  Transportation Needs: No Transportation Needs   Lack of Transportation (Medical): No   Lack of Transportation (Non-Medical): No  Physical Activity: Sufficiently Active   Days of Exercise per Week: 3 days   Minutes of Exercise per Session: 60 min  Stress: No Stress Concern Present   Feeling of Stress : Not at all  Social Connections: Socially Integrated   Frequency of Communication with Friends and Family: More than three times a week   Frequency of Social Gatherings with Friends and Family: More than three times a week   Attends Religious Services: 1 to 4 times per year   Active Member of [employer] or Organizations: No   Attends Music therapist: 1 to 4 times per year   Marital Status: Married    ECOG Status: {CHL ONC ECOG  YQ:[id number]}  Review of Systems: A 12 point ROS discussed and pertinent positives are indicated in the HPI above.  All other systems are negative.  Review of Systems  Vital Signs: There were no vitals taken for this visit.  Physical Exam  Imaging: CT Head Wo Contrast  Result Date: 02/24/2022 CLINICAL DATA:  Head trauma. EXAM: CT HEAD WITHOUT CONTRAST TECHNIQUE: Contiguous axial images were obtained from the base of the skull through the vertex without intravenous contrast. RADIATION DOSE REDUCTION: This exam was performed according to the departmental  dose-optimization program which includes automated exposure control, adjustment of the mA and/or kV according to patient size and/or use of iterative reconstruction technique. COMPARISON:  Head CT dated 11/08/2018. FINDINGS: Brain: Mild age-related atrophy and chronic microvascular ischemic changes. There is no acute intracranial hemorrhage. No mass effect or midline shift. No extra-axial fluid collection. Vascular: No hyperdense vessel or unexpected calcification. Skull: Normal. Negative for fracture or focal lesion. Sinuses/Orbits: The visualized paranasal sinuses and the left mastoid air cells are clear. Mild right mastoid effusions. Other: None IMPRESSION: 1. No acute intracranial pathology. 2. Mild age-related atrophy and chronic microvascular ischemic changes. Electronically Signed   By: Anner Crete M.D.   On: 02/24/2022 20:35   MR BRAIN W WO CONTRAST  Result Date: 03/07/2022 CLINICAL DATA:  Brain metastases suspected EXAM: MRI HEAD WITHOUT AND WITH CONTRAST TECHNIQUE: Multiplanar, multiecho pulse sequences of the brain and surrounding structures were obtained without and with intravenous contrast. CONTRAST:  73m GADAVIST GADOBUTROL 1 MMOL/ML IV SOLN COMPARISON:  PET CT from 5 days ago FINDINGS: Brain: No enhancement or swelling to suggest metastatic disease. Small FLAIR hyperintensities in the cerebral white matter attributed to chronic small vessel ischemia. No acute infarct, hydrocephalus, or collection. Cerebral volume loss which is generalized. Vascular: Slow flow in the right transverse and sigmoid dural sinuses when comparing T2 and postcontrast imaging, attributed to tumoral compression in the right neck. Skull and upper cervical spine: No skull base erosion or visible bony metastasis. Sinuses/Orbits: Right mastoid opacification associated with a nasopharyngeal mass which is right eccentric. Other: Nasopharyngeal mass crossing midline but bulky towards the right with length of 6 cm and  thickness of 18 mm. There is contact of the carotid and parapharyngeal spaces on the right. No evidence of intracranial extension IMPRESSION: 1. 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 No skull base erosion or intracranial extension. 2. Right mastoid effusion. Electronically Signed   By: JJorje GuildM.D.   On: 03/07/2022 11:53   CT ABDOMEN PELVIS W CONTRAST  Result Date: 03/06/2022 CLINICAL DATA:  Urinary frequency. Undergoing treatment for urinary tract infection without improvement. Clinical concern for renal mass or abscess. Undergoing chemotherapy for diffuse large B cell lymphoma. EXAM: CT ABDOMEN AND PELVIS WITH CONTRAST TECHNIQUE: Multidetector CT imaging of the abdomen and pelvis was performed using the standard protocol following bolus administration of intravenous contrast. RADIATION DOSE REDUCTION: This exam was performed according to the departmental dose-optimization program which includes automated exposure control, adjustment of the mA and/or kV according to patient size and/or use of iterative reconstruction technique. CONTRAST:  1080mOMNIPAQUE IOHEXOL 300 MG/ML  SOLN COMPARISON:  PET-CT dated 03/02/2022 and abdomen and pelvis CT dated 09/05/2019. FINDINGS: Lower chest: The 4.3 x 3.9 cm right lower lobe lung mass seen on 03/02/2022 currently measures 4.7 x 4.4 cm on image number 15/4. There is interval visualization of a 5 mm similar appearing nodule adjacent to this mass posteriorly and superiorly on  image number 13/4. There is also bronchial mucous plugging at the posterior, inferior aspect of the mass, without significant change. The heart remains mildly enlarged. Minimal pericardial effusion with a maximum thickness of 6 mm. Atheromatous calcifications, including the coronary arteries and aorta. Hepatobiliary: Unremarkable liver. Mild sludge or noncalcified gallstones in the dependent portion of the gallbladder. No gallbladder wall  thickening or pericholecystic fluid. Pancreas: Moderate diffuse pancreatic atrophy. Spleen: Normal in size without focal abnormality. Adrenals/Urinary Tract: Low-density right adrenal nodule with medium density thin internal septations in the superior aspect of the right adrenal gland, measuring 2.9 x 1.5 cm on coronal image number 47, without significant change since 09/05/2019. No increased activity on the recent PET-CT. Unremarkable left adrenal gland. Tiny lower pole right renal calculus. 4 mm mid left renal calculus. Small mid left renal cyst. No bladder or ureteral calculi no hydronephrosis. The distal right ureter is obscured by streak artifacts from patient's right hip prosthesis. Stomach/Bowel: Distended rectum containing stool and gas. Moderate stool throughout the remainder of the colon. Unremarkable stomach and small bowel. The appendix is not visualized and surgically absent by history. Vascular/Lymphatic: Atheromatous arterial calcifications without aneurysm. Enlarged, recently hypermetabolic left inguinal lymph node with a short axis diameter 19 mm on image number 98/2. Multiple additional enlarged left pelvic lymph nodes, including a common femoral node with a short axis diameter of 31 mm on image number 84/2 and no obturator node with a short axis diameter of 27 mm on image number 81/2. Reproductive: Prostate is unremarkable. Other: Small to moderate-sized ventral hernia containing fat small to moderate-sized infraumbilical hernia containing a small to moderate-sized supraumbilical hernia containing fat without change. Small left inguinal hernia containing fat. Musculoskeletal: Right hip prosthesis with associated streak artifact. Severe L1 vertebral compression deformity with bony retropulsion causing moderate to marked canal stenosis without significant change. Associated acute kyphosis without significant change. No acute fractures or acute subluxations. Mild anterior spur formation at multiple  levels in the lower thoracic spine and moderate to large anterior spurs at the anterior T12 and L2 levels on both sides of the severely compressed L1 vertebra. Stable anterior subluxation of the lumbar spine inferior to the L1 fracture relative to the spine above the fracture. Marked right psoas, iliacus and iliopsoas muscle atrophy. IMPRESSION: 1. No renal mass or abscess. 2. Small, nonobstructing calculus in each kidney. 3. Mild increase in size of the previously demonstrated right lower lobe hypermetabolic mass with an interval small satellite nodule posteriorly. This is concerning for a primary lung neoplasm or lymphoma involving the lung. 4. Recently demonstrated left pelvic and inguinal adenopathy, most likely due to the patient's known lymphoma. 5. Stable small to moderate-sized supraumbilical hernia containing herniated fat. 6. Stable right adrenal probable adenoma. Electronically Signed   By: Claudie Revering M.D.   On: 03/06/2022 16:39   NM PET Image Restag (PS) Skull Base To Thigh  Result Date: 03/02/2022 CLINICAL DATA:  Follow-up treatment strategy for diffuse large B-cell lymphoma. EXAM: NUCLEAR MEDICINE PET SKULL BASE TO THIGH TECHNIQUE: 0.8 mCi F-18 FDG was injected intravenously. Full-ring PET imaging was performed from the skull base to thigh after the radiotracer. CT data was obtained and used for attenuation correction and anatomic localization. Fasting blood glucose: 107 mg/dl COMPARISON:  PET-CT dated November 22, 2018; PET-CT dated August 22, 2018 FINDINGS: Mediastinal blood pool activity: SUV max 2.8 Liver activity: SUV max 3.8 NECK: New marked hypermetabolic soft tissue thickening of the right posterior nasopharynx with SUV max of 20.5. Large  lymph node located anterior to the right sternocleidomastoid muscle unchanged in size, measuring 2.2 cm in short axis, and demonstrates similar mild FDG uptake, SUV max of 2.6, previously 2.4. New hypermetabolic subcentimeter right level IIb lymph node  measuring 5 mm on series 4, image 34. Incidental CT findings: none CHEST: New hypermetabolic right lower lobe lung mass measuring 3.9 x 4.3 cm with SUV max of 12.4. Incidental CT findings: Left main and three-vessel coronary artery calcifications. Atherosclerotic disease of the thoracic aorta. Right chest wall port with tip in the mid SVC. ABDOMEN/PELVIS: Enlarged hypermetabolic left pelvic and left inguinal lymph nodes. Reference left pelvic sidewall lymph node measuring 2.8 cm in short axis on series 4, image 190 with SUV max of 33.5. Enlarged left inguinal lymph node measuring 1.9 cm in short axis on series 4, image 207 with SUV max of hypermetabolic soft tissue nodules and masses of the left buttocks. Largest measures 7.7 x 4.1 cm on series 4, image 189 with SUV max of 18.5. Incidental CT findings: Simple appearing cyst of the left kidney, no further follow-up imaging is recommended for this lesion. Nonobstructing stone of the left kidney. Atherosclerotic disease of the abdominal aorta. Moderate fat containing umbilical hernia. Air is seen in the urinary bladder. SKELETON: No suspicious focal uptake to suggest osseous metastatic disease. Incidental CT findings: Prior total left hip arthroplasty with cerclage wires and surrounding hypermetabolic activity, similar to prior exam. Unchanged severe compression deformity at L1. Spinous process fractures with associated FDG uptake extending from approximately the T8-T1 levels. IMPRESSION: 1. New marked hypermetabolic soft tissue thickening of the right posterior nasopharynx. 2. New hypermetabolic subcentimeterright level IIb lymph node. 3. New hypermetabolic right lower lobe lung mass. 4. New enlarged and hypermetabolic left pelvic and left inguinal lymph nodes. 5. New hypermetabolic soft tissue nodules and masses of the left buttocks. 6. Enlarged lymph node located anterior to the right sternocleidomastoid muscle is unchanged in size and demonstrates similar FDG  uptake, likely treated disease. 7. Spinous process fractures with associated FDG uptake extending from approximately the T8-T1 levels. Correlate for history of recent trauma. 8. Air is seen in the urinary bladder, possibly due to recent instrumentation or infection. Correlate with urinalysis. Electronically Signed   By: Yetta Glassman M.D.   On: 03/02/2022 15:53   DG Chest Port 1 View  Result Date: 03/06/2022 CLINICAL DATA:  Possible sepsis. EXAM: PORTABLE CHEST 1 VIEW COMPARISON:  10/13/2021. FINDINGS: Cardiac silhouette is normal in size. No mediastinal or hilar masses. Right anterior chest wall Port-A-Cath is stable, tip in the mid superior vena cava. Chronic prominence of the interstitial markings bilaterally. Lungs otherwise clear. No pleural effusion or pneumothorax. Skeletal structures are grossly intact. IMPRESSION: No active disease. Electronically Signed   By: Lajean Manes M.D.   On: 03/06/2022 15:04    Labs:  CBC: Recent Labs    01/25/22 1400 02/24/22 1502 03/06/22 1446 03/07/22 0532  WBC 5.9 7.8 7.0 7.5  HGB 16.3 16.8 17.1* 16.6  HCT 49.1 51.4 51.0 48.7  PLT 142.0* 165 164 152    COAGS: Recent Labs    03/06/22 1446  INR 1.1  APTT 37*    BMP: Recent Labs    02/24/22 1502 03/06/22 1446 03/07/22 0532 03/08/22 0438  NA 135 135 133* 138  K 4.9 3.7 3.7 3.7  CL 98 95* 98 104  CO2 '28 28 25 26  '$ GLUCOSE 106* 85 82 119*  BUN 37* 28* 22 30*  CALCIUM 10.1 9.5 8.6* 9.2  CREATININE 1.61* 1.63* 1.38* 1.51*  GFRNONAA 44* 43* 53* 48*    LIVER FUNCTION TESTS: Recent Labs    12/08/21 1340 01/25/22 1400 02/24/22 1502 03/06/22 1446  BILITOT 1.3* 1.3* 1.5* 1.4*  AST '31 29 30 '$ 36  ALT '30 29 28 28  '$ ALKPHOS 57 58 73 71  PROT 6.9 6.7 7.1 7.1  ALBUMIN 3.9 3.8 3.6 3.8    TUMOR MARKERS: No results for input(s): AFPTM, CEA, CA199, CHROMGRNA in the last 8760 hours.  Assessment and Plan:  Risks and benefits of was left lateral gluteal soft tissue mass biopsy with  moderate sedation discussed with the patient and/or patient's family including, but not limited to bleeding, infection, damage to adjacent structures or low yield requiring additional tests.  All of the questions were answered and there is agreement to proceed.  Consent signed and in chart.   Thank you for this interesting consult.  I greatly enjoyed meeting [name] and look forward to participating in their care.  A copy of this report was sent to the requesting provider on this date.  Electronically Signed: Tyson Alias, NP 03/15/2022, 7:36 PM   I spent a total of {New LAGT:[id number]} {New Out-Pt:[id number]}  {Established Out-Pt:[id number]} in face to face in clinical consultation, greater than 50% of which was counseling/coordinating care for ***

## 2022-03-16 ENCOUNTER — Ambulatory Visit (HOSPITAL_COMMUNITY)
Admission: RE | Admit: 2022-03-16 | Discharge: 2022-03-16 | Disposition: A | Discharge status: Home / Self Care | Payer: Medicare PPO | Source: Ambulatory Visit | Attending: Hematology | Admitting: Hematology

## 2022-03-16 ENCOUNTER — Encounter: Payer: Self-pay | Admitting: Hematology

## 2022-03-16 ENCOUNTER — Encounter (HOSPITAL_COMMUNITY): Payer: Self-pay

## 2022-03-16 DIAGNOSIS — L400 Psoriasis vulgaris: Secondary | ICD-10-CM | POA: Insufficient documentation

## 2022-03-16 DIAGNOSIS — Z7901 Long term (current) use of anticoagulants: Secondary | ICD-10-CM | POA: Diagnosis not present

## 2022-03-16 DIAGNOSIS — E0500 Thyrotoxicosis with diffuse goiter without thyrotoxic crisis or storm: Secondary | ICD-10-CM | POA: Diagnosis not present

## 2022-03-16 DIAGNOSIS — C8339 Diffuse large B-cell lymphoma, extranodal and solid organ sites: Secondary | ICD-10-CM | POA: Diagnosis not present

## 2022-03-16 DIAGNOSIS — G4733 Obstructive sleep apnea (adult) (pediatric): Secondary | ICD-10-CM | POA: Diagnosis not present

## 2022-03-16 DIAGNOSIS — E785 Hyperlipidemia, unspecified: Secondary | ICD-10-CM | POA: Diagnosis not present

## 2022-03-16 DIAGNOSIS — I2510 Atherosclerotic heart disease of native coronary artery without angina pectoris: Secondary | ICD-10-CM | POA: Diagnosis not present

## 2022-03-16 DIAGNOSIS — I4891 Unspecified atrial fibrillation: Secondary | ICD-10-CM | POA: Diagnosis not present

## 2022-03-16 DIAGNOSIS — N200 Calculus of kidney: Secondary | ICD-10-CM | POA: Insufficient documentation

## 2022-03-16 DIAGNOSIS — C8330 Diffuse large B-cell lymphoma, unspecified site: Secondary | ICD-10-CM | POA: Diagnosis not present

## 2022-03-16 DIAGNOSIS — E8809 Other disorders of plasma-protein metabolism, not elsewhere classified: Secondary | ICD-10-CM | POA: Diagnosis not present

## 2022-03-16 DIAGNOSIS — N1831 Chronic kidney disease, stage 3a: Secondary | ICD-10-CM | POA: Diagnosis not present

## 2022-03-16 DIAGNOSIS — K219 Gastro-esophageal reflux disease without esophagitis: Secondary | ICD-10-CM | POA: Diagnosis not present

## 2022-03-16 DIAGNOSIS — Z66 Do not resuscitate: Secondary | ICD-10-CM | POA: Diagnosis not present

## 2022-03-16 DIAGNOSIS — R2242 Localized swelling, mass and lump, left lower limb: Secondary | ICD-10-CM | POA: Diagnosis not present

## 2022-03-16 DIAGNOSIS — I509 Heart failure, unspecified: Secondary | ICD-10-CM | POA: Insufficient documentation

## 2022-03-16 DIAGNOSIS — N179 Acute kidney failure, unspecified: Secondary | ICD-10-CM | POA: Diagnosis not present

## 2022-03-16 DIAGNOSIS — I110 Hypertensive heart disease with heart failure: Secondary | ICD-10-CM | POA: Diagnosis not present

## 2022-03-16 DIAGNOSIS — D696 Thrombocytopenia, unspecified: Secondary | ICD-10-CM | POA: Insufficient documentation

## 2022-03-16 DIAGNOSIS — E890 Postprocedural hypothyroidism: Secondary | ICD-10-CM | POA: Diagnosis not present

## 2022-03-16 DIAGNOSIS — F411 Generalized anxiety disorder: Secondary | ICD-10-CM | POA: Insufficient documentation

## 2022-03-16 DIAGNOSIS — I130 Hypertensive heart and chronic kidney disease with heart failure and stage 1 through stage 4 chronic kidney disease, or unspecified chronic kidney disease: Secondary | ICD-10-CM | POA: Diagnosis not present

## 2022-03-16 DIAGNOSIS — I5032 Chronic diastolic (congestive) heart failure: Secondary | ICD-10-CM | POA: Diagnosis not present

## 2022-03-16 HISTORY — DX: Dyspnea, unspecified: R06.00

## 2022-03-16 LAB — CBC WITH DIFFERENTIAL/PLATELET
Abs Immature Granulocytes: 0.04 10*3/uL (ref 0.00–0.07)
Basophils Absolute: 0 10*3/uL (ref 0.0–0.1)
Basophils Relative: 1 %
Eosinophils Absolute: 0.1 10*3/uL (ref 0.0–0.5)
Eosinophils Relative: 1 %
HCT: 48.7 % (ref 39.0–52.0)
Hemoglobin: 16.5 g/dL (ref 13.0–17.0)
Immature Granulocytes: 1 %
Lymphocytes Relative: 21 %
Lymphs Abs: 1.3 10*3/uL (ref 0.7–4.0)
MCH: 31.4 pg (ref 26.0–34.0)
MCHC: 33.9 g/dL (ref 30.0–36.0)
MCV: 92.8 fL (ref 80.0–100.0)
Monocytes Absolute: 0.5 10*3/uL (ref 0.1–1.0)
Monocytes Relative: 7 %
Neutro Abs: 4.3 10*3/uL (ref 1.7–7.7)
Neutrophils Relative %: 69 %
Platelets: 163 10*3/uL (ref 150–400)
RBC: 5.25 MIL/uL (ref 4.22–5.81)
RDW: 16 % — ABNORMAL HIGH (ref 11.5–15.5)
WBC: 6.3 10*3/uL (ref 4.0–10.5)
nRBC: 0 % (ref 0.0–0.2)

## 2022-03-16 MED ORDER — GELATIN ABSORBABLE 12-7 MM EX MISC
CUTANEOUS | Status: AC
  Administered 2022-03-16: 1
  Filled 2022-03-16: qty 1

## 2022-03-16 MED ORDER — FENTANYL CITRATE (PF) 100 MCG/2ML IJ SOLN
INTRAMUSCULAR | Status: AC | PRN
  Administered 2022-03-16: 50 ug via INTRAVENOUS

## 2022-03-16 MED ORDER — SODIUM CHLORIDE 0.9 % IV SOLN: INTRAVENOUS | Status: DC

## 2022-03-16 MED ORDER — LIDOCAINE HCL 1 % IJ SOLN
INTRAMUSCULAR | Status: AC
  Filled 2022-03-16: qty 20

## 2022-03-16 MED ORDER — LIDOCAINE HCL 1 % IJ SOLN
INTRAMUSCULAR | Status: AC | PRN
  Administered 2022-03-16: 10 mL via INTRADERMAL

## 2022-03-16 MED ORDER — FENTANYL CITRATE (PF) 100 MCG/2ML IJ SOLN
INTRAMUSCULAR | Status: AC
  Filled 2022-03-16: qty 2

## 2022-03-16 NOTE — Discharge Instructions (Signed)
Please call Interventional Radiology clinic [phone] with any questions or concerns.   You may remove your dressing and shower tomorrow.    Needle Biopsy, Care After These instructions tell you how to care for yourself after your procedure. Your doctor may also give you more specific instructions. Call your doctor if youhave any problems or questions. What can I expect after the procedure? After the procedure, it is common to have: Soreness. Bruising. Mild pain. Follow these instructions at home:  Return to your normal activities as told by your doctor. Ask your doctor what activities are safe for you. Take over-the-counter and prescription medicines only as told by your doctor. Wash your hands with soap and water before you change your bandage (dressing). If you cannot use soap and water, use hand sanitizer. Follow instructions from your doctor about: How to take care of your puncture site. When and how to change your bandage. When to remove your bandage. Check your puncture site every day for signs of infection. Watch for: Redness, swelling, or pain. Fluid or blood. Pus or a bad smell. Warmth. Do not take baths, swim, or use a hot tub until your doctor approves. Ask your doctor if you may take showers. You may only be allowed to take sponge baths. Keep all follow-up visits as told by your doctor. This is important. Contact a doctor if you have: A fever. Redness, swelling, or pain at the puncture site, and it lasts longer than a few days. Fluid, blood, or pus coming from the puncture site. Warmth coming from the puncture site. Get help right away if: You have a lot of bleeding from the puncture site. Summary After the procedure, it is common to have soreness, bruising, or mild pain at the puncture site. Check your puncture site every day for signs of infection, such as redness, swelling, or pain. Get help right away if you have severe bleeding from your puncture  site. This information is not intended to replace advice given to you by your health care provider. Make sure you discuss any questions you have with your healthcare provider. Document Revised: 03/27/2020 Document Reviewed: 03/27/2020 Elsevier Patient Education  2022 [employer]   Moderate Conscious Sedation, Adult, Care After This sheet gives you information about how to care for yourself after your procedure. Your health care provider may also give you more specific instructions. If you have problems or questions, contact your health care provider. What can I expect after the procedure? After the procedure, it is common to have: Sleepiness for several hours. Impaired judgment for several hours. Difficulty with balance. Vomiting if you eat too soon. Follow these instructions at home: For the time period you were told by your health care provider: Rest. Do not participate in activities where you could fall or become injured. Do not drive or use machinery. Do not drink alcohol. Do not take sleeping pills or medicines that cause drowsiness. Do not make important decisions or sign legal documents. Do not take care of children on your own.      Eating and drinking Follow the diet recommended by your health care provider. Drink enough fluid to keep your urine pale yellow. If you vomit: Drink water, juice, or soup when you can drink without vomiting. Make sure you have little or no nausea before eating solid foods.   General instructions Take over-the-counter and prescription medicines only as told by your health care provider. Have a responsible adult stay with you for the time you are told.   It is important to have someone help care for you until you are awake and alert. Do not smoke. Keep all follow-up visits as told by your health care provider. This is important. Contact a health care provider if: You are still sleepy or having trouble with balance after 24 hours. You feel  light-headed. You keep feeling nauseous or you keep vomiting. You develop a rash. You have a fever. You have redness or swelling around the IV site. Get help right away if: You have trouble breathing. You have new-onset confusion at home. Summary After the procedure, it is common to feel sleepy, have impaired judgment, or feel nauseous if you eat too soon. Rest after you get home. Know the things you should not do after the procedure. Follow the diet recommended by your health care provider and drink enough fluid to keep your urine pale yellow. Get help right away if you have trouble breathing or new-onset confusion at home. This information is not intended to replace advice given to you by your health care provider. Make sure you discuss any questions you have with your health care provider. Document Revised: 01/25/2020 Document Reviewed: 08/23/2019 Elsevier Patient Education  2021 Else[employer] Inc.     

## 2022-03-16 NOTE — Procedures (Signed)
Vascular and Interventional Radiology Procedure Note  Patient: [name] DOB: 05-30-1946 Medical Record Number: [id number] Note Date/Time: 03/16/22 1:15 PM   Performing Physician: Michaelle Birks, MD Assistant(s): None  Diagnosis: Hx DLBCL. Hypermetabolic L gluteal soft tissues.  Procedure: LEFT GLUTEAL SOFT TISSUE MASS BIOPSY  Anesthesia: Conscious Sedation Complications: None Estimated Blood Loss: Minimal Specimens: Sent for Pathology  Findings:  Successful Ultrasound-guided biopsy of L gluteal SQ mass . A total of 4 samples were obtained. Hemostasis of the tract was achieved using Gelfoam Pledget Embolization.  Plan: Bed rest for 1 hours.  See detailed procedure note with images in PACS. The patient tolerated the procedure well without incident or complication and was returned to Recovery in stable condition.    Michaelle Birks, MD [facility]   Pager. [place]

## 2022-03-17 ENCOUNTER — Other Ambulatory Visit: Payer: Self-pay

## 2022-03-17 DIAGNOSIS — C8330 Diffuse large B-cell lymphoma, unspecified site: Secondary | ICD-10-CM

## 2022-03-17 NOTE — Progress Notes (Signed)
urine

## 2022-03-18 ENCOUNTER — Other Ambulatory Visit: Payer: Self-pay

## 2022-03-18 DIAGNOSIS — C8330 Diffuse large B-cell lymphoma, unspecified site: Secondary | ICD-10-CM

## 2022-03-18 LAB — SURGICAL PATHOLOGY

## 2022-03-19 ENCOUNTER — Encounter (HOSPITAL_COMMUNITY): Payer: Self-pay | Admitting: Internal Medicine

## 2022-03-19 ENCOUNTER — Inpatient Hospital Stay: Payer: Medicare PPO | Attending: Hematology | Admitting: Hematology

## 2022-03-19 ENCOUNTER — Inpatient Hospital Stay: Payer: Medicare PPO

## 2022-03-19 ENCOUNTER — Other Ambulatory Visit: Payer: Self-pay

## 2022-03-19 ENCOUNTER — Inpatient Hospital Stay (HOSPITAL_COMMUNITY)
Admission: AD | Admit: 2022-03-19 | Discharge: 2022-03-26 | DRG: 683 | Disposition: A | Discharge status: Skilled Nursing Facility | Payer: Medicare PPO | Source: Ambulatory Visit | Attending: Internal Medicine | Admitting: Internal Medicine

## 2022-03-19 VITALS — BP 98/59 | HR 93 | Temp 97.0°F | Resp 17 | Ht 76.0 in | Wt 251.7 lb

## 2022-03-19 DIAGNOSIS — Z7189 Other specified counseling: Secondary | ICD-10-CM

## 2022-03-19 DIAGNOSIS — I130 Hypertensive heart and chronic kidney disease with heart failure and stage 1 through stage 4 chronic kidney disease, or unspecified chronic kidney disease: Secondary | ICD-10-CM | POA: Diagnosis present

## 2022-03-19 DIAGNOSIS — Z66 Do not resuscitate: Secondary | ICD-10-CM

## 2022-03-19 DIAGNOSIS — E8809 Other disorders of plasma-protein metabolism, not elsewhere classified: Secondary | ICD-10-CM | POA: Diagnosis present

## 2022-03-19 DIAGNOSIS — I2582 Chronic total occlusion of coronary artery: Secondary | ICD-10-CM | POA: Diagnosis present

## 2022-03-19 DIAGNOSIS — G4733 Obstructive sleep apnea (adult) (pediatric): Secondary | ICD-10-CM | POA: Diagnosis present

## 2022-03-19 DIAGNOSIS — W19XXXA Unspecified fall, initial encounter: Secondary | ICD-10-CM | POA: Diagnosis present

## 2022-03-19 DIAGNOSIS — Z6831 Body mass index (BMI) 31.0-31.9, adult: Secondary | ICD-10-CM

## 2022-03-19 DIAGNOSIS — E039 Hypothyroidism, unspecified: Secondary | ICD-10-CM | POA: Diagnosis present

## 2022-03-19 DIAGNOSIS — L400 Psoriasis vulgaris: Secondary | ICD-10-CM | POA: Diagnosis present

## 2022-03-19 DIAGNOSIS — E669 Obesity, unspecified: Secondary | ICD-10-CM | POA: Diagnosis present

## 2022-03-19 DIAGNOSIS — I4819 Other persistent atrial fibrillation: Secondary | ICD-10-CM | POA: Diagnosis present

## 2022-03-19 DIAGNOSIS — Z79890 Hormone replacement therapy (postmenopausal): Secondary | ICD-10-CM

## 2022-03-19 DIAGNOSIS — Z974 Presence of external hearing-aid: Secondary | ICD-10-CM | POA: Diagnosis not present

## 2022-03-19 DIAGNOSIS — W1830XA Fall on same level, unspecified, initial encounter: Secondary | ICD-10-CM | POA: Diagnosis present

## 2022-03-19 DIAGNOSIS — N390 Urinary tract infection, site not specified: Secondary | ICD-10-CM | POA: Diagnosis not present

## 2022-03-19 DIAGNOSIS — Z87440 Personal history of urinary (tract) infections: Secondary | ICD-10-CM | POA: Insufficient documentation

## 2022-03-19 DIAGNOSIS — M810 Age-related osteoporosis without current pathological fracture: Secondary | ICD-10-CM | POA: Diagnosis present

## 2022-03-19 DIAGNOSIS — N138 Other obstructive and reflux uropathy: Secondary | ICD-10-CM | POA: Diagnosis not present

## 2022-03-19 DIAGNOSIS — I2510 Atherosclerotic heart disease of native coronary artery without angina pectoris: Secondary | ICD-10-CM | POA: Diagnosis present

## 2022-03-19 DIAGNOSIS — N400 Enlarged prostate without lower urinary tract symptoms: Secondary | ICD-10-CM | POA: Diagnosis not present

## 2022-03-19 DIAGNOSIS — C8330 Diffuse large B-cell lymphoma, unspecified site: Secondary | ICD-10-CM | POA: Diagnosis not present

## 2022-03-19 DIAGNOSIS — Z96653 Presence of artificial knee joint, bilateral: Secondary | ICD-10-CM | POA: Diagnosis present

## 2022-03-19 DIAGNOSIS — J449 Chronic obstructive pulmonary disease, unspecified: Secondary | ICD-10-CM | POA: Diagnosis present

## 2022-03-19 DIAGNOSIS — N179 Acute kidney failure, unspecified: Principal | ICD-10-CM | POA: Diagnosis present

## 2022-03-19 DIAGNOSIS — I5032 Chronic diastolic (congestive) heart failure: Secondary | ICD-10-CM | POA: Diagnosis present

## 2022-03-19 DIAGNOSIS — N1831 Chronic kidney disease, stage 3a: Secondary | ICD-10-CM | POA: Insufficient documentation

## 2022-03-19 DIAGNOSIS — K219 Gastro-esophageal reflux disease without esophagitis: Secondary | ICD-10-CM | POA: Diagnosis present

## 2022-03-19 DIAGNOSIS — Z961 Presence of intraocular lens: Secondary | ICD-10-CM | POA: Diagnosis present

## 2022-03-19 DIAGNOSIS — Z8249 Family history of ischemic heart disease and other diseases of the circulatory system: Secondary | ICD-10-CM

## 2022-03-19 DIAGNOSIS — Z5111 Encounter for antineoplastic chemotherapy: Secondary | ICD-10-CM

## 2022-03-19 DIAGNOSIS — I480 Paroxysmal atrial fibrillation: Secondary | ICD-10-CM | POA: Diagnosis not present

## 2022-03-19 DIAGNOSIS — N401 Enlarged prostate with lower urinary tract symptoms: Secondary | ICD-10-CM | POA: Diagnosis present

## 2022-03-19 DIAGNOSIS — R42 Dizziness and giddiness: Secondary | ICD-10-CM | POA: Diagnosis not present

## 2022-03-19 DIAGNOSIS — E890 Postprocedural hypothyroidism: Secondary | ICD-10-CM | POA: Diagnosis present

## 2022-03-19 DIAGNOSIS — Z9221 Personal history of antineoplastic chemotherapy: Secondary | ICD-10-CM

## 2022-03-19 DIAGNOSIS — Z87891 Personal history of nicotine dependence: Secondary | ICD-10-CM

## 2022-03-19 DIAGNOSIS — I10 Essential (primary) hypertension: Secondary | ICD-10-CM | POA: Diagnosis not present

## 2022-03-19 DIAGNOSIS — F32A Depression, unspecified: Secondary | ICD-10-CM | POA: Diagnosis present

## 2022-03-19 DIAGNOSIS — F411 Generalized anxiety disorder: Secondary | ICD-10-CM | POA: Diagnosis present

## 2022-03-19 DIAGNOSIS — Z9181 History of falling: Secondary | ICD-10-CM

## 2022-03-19 DIAGNOSIS — R591 Generalized enlarged lymph nodes: Secondary | ICD-10-CM | POA: Diagnosis not present

## 2022-03-19 DIAGNOSIS — C8339 Diffuse large B-cell lymphoma, extranodal and solid organ sites: Secondary | ICD-10-CM | POA: Diagnosis present

## 2022-03-19 DIAGNOSIS — Z7901 Long term (current) use of anticoagulants: Secondary | ICD-10-CM

## 2022-03-19 DIAGNOSIS — R4182 Altered mental status, unspecified: Secondary | ICD-10-CM | POA: Diagnosis not present

## 2022-03-19 DIAGNOSIS — Z7982 Long term (current) use of aspirin: Secondary | ICD-10-CM

## 2022-03-19 DIAGNOSIS — Z5189 Encounter for other specified aftercare: Secondary | ICD-10-CM | POA: Insufficient documentation

## 2022-03-19 DIAGNOSIS — Z8261 Family history of arthritis: Secondary | ICD-10-CM

## 2022-03-19 DIAGNOSIS — Z96643 Presence of artificial hip joint, bilateral: Secondary | ICD-10-CM | POA: Diagnosis present

## 2022-03-19 DIAGNOSIS — M2550 Pain in unspecified joint: Secondary | ICD-10-CM | POA: Diagnosis not present

## 2022-03-19 DIAGNOSIS — R627 Adult failure to thrive: Secondary | ICD-10-CM | POA: Insufficient documentation

## 2022-03-19 DIAGNOSIS — D696 Thrombocytopenia, unspecified: Secondary | ICD-10-CM | POA: Diagnosis present

## 2022-03-19 DIAGNOSIS — Z7401 Bed confinement status: Secondary | ICD-10-CM | POA: Diagnosis not present

## 2022-03-19 DIAGNOSIS — N178 Other acute kidney failure: Secondary | ICD-10-CM | POA: Diagnosis not present

## 2022-03-19 DIAGNOSIS — I5022 Chronic systolic (congestive) heart failure: Secondary | ICD-10-CM | POA: Diagnosis present

## 2022-03-19 DIAGNOSIS — C8338 Diffuse large B-cell lymphoma, lymph nodes of multiple sites: Secondary | ICD-10-CM | POA: Diagnosis not present

## 2022-03-19 DIAGNOSIS — R590 Localized enlarged lymph nodes: Secondary | ICD-10-CM | POA: Diagnosis present

## 2022-03-19 DIAGNOSIS — N139 Obstructive and reflux uropathy, unspecified: Secondary | ICD-10-CM | POA: Diagnosis not present

## 2022-03-19 DIAGNOSIS — Z5112 Encounter for antineoplastic immunotherapy: Secondary | ICD-10-CM | POA: Insufficient documentation

## 2022-03-19 DIAGNOSIS — R296 Repeated falls: Secondary | ICD-10-CM | POA: Diagnosis present

## 2022-03-19 DIAGNOSIS — N189 Chronic kidney disease, unspecified: Secondary | ICD-10-CM | POA: Diagnosis not present

## 2022-03-19 DIAGNOSIS — R918 Other nonspecific abnormal finding of lung field: Secondary | ICD-10-CM | POA: Diagnosis not present

## 2022-03-19 DIAGNOSIS — Z960 Presence of urogenital implants: Secondary | ICD-10-CM | POA: Diagnosis present

## 2022-03-19 DIAGNOSIS — R6251 Failure to thrive (child): Secondary | ICD-10-CM | POA: Diagnosis not present

## 2022-03-19 DIAGNOSIS — Z79899 Other long term (current) drug therapy: Secondary | ICD-10-CM

## 2022-03-19 LAB — CBC WITH DIFFERENTIAL (CANCER CENTER ONLY)
Abs Immature Granulocytes: 0.06 10*3/uL (ref 0.00–0.07)
Basophils Absolute: 0 10*3/uL (ref 0.0–0.1)
Basophils Relative: 1 %
Eosinophils Absolute: 0.1 10*3/uL (ref 0.0–0.5)
Eosinophils Relative: 1 %
HCT: 48.1 % (ref 39.0–52.0)
Hemoglobin: 16.7 g/dL (ref 13.0–17.0)
Immature Granulocytes: 1 %
Lymphocytes Relative: 24 %
Lymphs Abs: 2 10*3/uL (ref 0.7–4.0)
MCH: 31.8 pg (ref 26.0–34.0)
MCHC: 34.7 g/dL (ref 30.0–36.0)
MCV: 91.6 fL (ref 80.0–100.0)
Monocytes Absolute: 0.6 10*3/uL (ref 0.1–1.0)
Monocytes Relative: 7 %
Neutro Abs: 5.7 10*3/uL (ref 1.7–7.7)
Neutrophils Relative %: 66 %
Platelet Count: 173 10*3/uL (ref 150–400)
RBC: 5.25 MIL/uL (ref 4.22–5.81)
RDW: 15.9 % — ABNORMAL HIGH (ref 11.5–15.5)
WBC Count: 8.4 10*3/uL (ref 4.0–10.5)
nRBC: 0 % (ref 0.0–0.2)

## 2022-03-19 LAB — CMP (CANCER CENTER ONLY)
ALT: 32 U/L (ref 0–44)
AST: 41 U/L (ref 15–41)
Albumin: 3.7 g/dL (ref 3.5–5.0)
Alkaline Phosphatase: 74 U/L (ref 38–126)
Anion gap: 12 (ref 5–15)
BUN: 38 mg/dL — ABNORMAL HIGH (ref 8–23)
CO2: 24 mmol/L (ref 22–32)
Calcium: 10.5 mg/dL — ABNORMAL HIGH (ref 8.9–10.3)
Chloride: 99 mmol/L (ref 98–111)
Creatinine: 2.17 mg/dL — ABNORMAL HIGH (ref 0.61–1.24)
GFR, Estimated: 31 mL/min — ABNORMAL LOW (ref >60)
Glucose, Bld: 111 mg/dL — ABNORMAL HIGH (ref 70–99)
Potassium: 4.1 mmol/L (ref 3.5–5.1)
Sodium: 135 mmol/L (ref 135–145)
Total Bilirubin: 1.4 mg/dL — ABNORMAL HIGH (ref 0.3–1.2)
Total Protein: 6.7 g/dL (ref 6.5–8.1)

## 2022-03-19 LAB — LACTATE DEHYDROGENASE: LDH: 343 U/L — ABNORMAL HIGH (ref 98–192)

## 2022-03-19 MED ORDER — PROCHLORPERAZINE MALEATE 10 MG PO TABS: 10.0000 mg | ORAL_TABLET | Freq: Four times a day (QID) | ORAL | 1 refills | Status: DC | PRN

## 2022-03-19 MED ORDER — SODIUM CHLORIDE 0.9 % IV SOLN: INTRAVENOUS | Status: AC

## 2022-03-19 MED ORDER — ACETAMINOPHEN 650 MG RE SUPP: 650.0000 mg | Freq: Four times a day (QID) | RECTAL | Status: DC | PRN

## 2022-03-19 MED ORDER — ONDANSETRON HCL 4 MG/2ML IJ SOLN: 4.0000 mg | Freq: Four times a day (QID) | INTRAMUSCULAR | Status: DC | PRN

## 2022-03-19 MED ORDER — DEXAMETHASONE 4 MG PO TABS: 20.0000 mg | ORAL_TABLET | Freq: Every day | ORAL | 3 refills | Status: DC

## 2022-03-19 MED ORDER — ONDANSETRON HCL 8 MG PO TABS: 8.0000 mg | ORAL_TABLET | Freq: Two times a day (BID) | ORAL | 1 refills | Status: DC | PRN

## 2022-03-19 MED ORDER — ONDANSETRON HCL 4 MG PO TABS: 4.0000 mg | ORAL_TABLET | Freq: Four times a day (QID) | ORAL | Status: DC | PRN

## 2022-03-19 MED ORDER — ALLOPURINOL 100 MG PO TABS: 100.0000 mg | ORAL_TABLET | Freq: Two times a day (BID) | ORAL | 0 refills | Status: DC

## 2022-03-19 MED ORDER — SODIUM CHLORIDE 0.9 % IV SOLN
2.0000 g | Freq: Two times a day (BID) | INTRAVENOUS | Status: DC
  Administered 2022-03-19 – 2022-03-20 (×3): 2 g via INTRAVENOUS
  Filled 2022-03-19 (×4): qty 12.5

## 2022-03-19 MED ORDER — ENSURE ENLIVE PO LIQD
237.0000 mL | Freq: Two times a day (BID) | ORAL | Status: DC
  Administered 2022-03-20 – 2022-03-26 (×8): 237 mL via ORAL

## 2022-03-19 MED ORDER — ACETAMINOPHEN 325 MG PO TABS: 650.0000 mg | ORAL_TABLET | Freq: Four times a day (QID) | ORAL | Status: DC | PRN

## 2022-03-19 NOTE — Assessment & Plan Note (Signed)
Stable. Continue synthroid 100 mcg daily. 

## 2022-03-19 NOTE — Assessment & Plan Note (Signed)
Stable. Not volume overloaded at this point. May be dehydrated given AKI.

## 2022-03-19 NOTE — Assessment & Plan Note (Signed)
Verified with pt and his husband michael at bedside.

## 2022-03-19 NOTE — Progress Notes (Signed)
Bladder scan with pt : 107 mL

## 2022-03-19 NOTE — H&P (Signed)
History and Physical    [name] WRU:[id number] DOB: [birth year] DOA: 03/19/2022  DOS: the patient was seen and examined on 03/19/2022  PCP: Plotnikov, Evie Lacks, MD   Patient coming from: Home  I have personally briefly reviewed patient's old medical records in [place]  CC: direct admission for AKI HPI: 76 year old white male history of B-cell lymphoma, hypertension, BPH, hypothyroidism, paroxysmal atrial fibrillation on Xarelto, chronic diastolic heart failure, CKD stage IIIa baseline creatinine 1.5-1.6, presents to the hospital as a direct admission.  Patient seen in oncology clinic today.  Patient has been having chills at home for couple days.  No documented fevers.  He has been having increasing urinary frequency going about every hour.  Occasional dysuria.  Patient does have a history of BPH.  Is on Uroxatrol.  Was seen in clinic today.  He complained to his oncologist about fatigue and failure to thrive.  He has been having falls at home.  Recent PET scan showed widespread pulmonary involvement of his B-cell lymphoma.  Labs are drawn in clinic.  Creatinine was 2.17.  Baseline 1.5-1.6.  BUN of 38 baseline approximately 22-37.  White count 8.4, hemoglobin 16.7, platelets 173.  Direct admission was requested by oncology.  Patient went home.  Patient's husband [name] states they were called around 4 to 5:00 PM today that the bed is ready.  He showed up to the hospital around 8 PM for admission.  His husband [name] states that after he got home from oncology clinic, he had another fall at home.  They did call EMS in order to pick him up and placed him into the car to bring him to the hospital for direct admission.   Review of Systems:  Review of Systems  Constitutional:  Positive for chills and malaise/fatigue.  HENT: Negative.    Eyes: Negative.   Respiratory: Negative.    Cardiovascular: Negative.   Gastrointestinal: Negative.   Genitourinary:  Positive for  dysuria, frequency and urgency.  Musculoskeletal:  Positive for falls and myalgias.  Skin: Negative.   Neurological:  Positive for weakness.  Endo/Heme/Allergies: Negative.   Psychiatric/Behavioral: Negative.    All other systems reviewed and are negative.   Past Medical History:  Diagnosis Date   Anticoagulant long-term use    xarelto--- takes for AFib,  managed by pcp, Dr Alain Marion   Benign localized prostatic hyperplasia with lower urinary tract symptoms (LUTS)    CAD (coronary artery disease) cardiologist--  dr t. Oval Linsey   hx of known CAD obstruction w/ collaterals (cath done @ [facility] 12-31-2011) ;   last cardiac cath 08-13-2019  showed sig. 2V CAD involving proxLAD and CTO of the RCA/  chronic total occlusion midRCA w/ bridging and L>R collaterals   Diastolic CHF ([place])    dx [phone] hospital admission (followed by pcp)   Diffuse large B cell lymphoma [facility]) oncologist-- dr Irene Limbo--- in remission   dx 08/ 2019 -- bx left tonsill mass-- involving lymph nodes--- completed chemotherapy 10-12-2018   DJD (degenerative joint disease)    Dyspnea    Dysthymic disorder    Elevated brain natriuretic peptide (BNP) level    Environmental allergies    GAD (generalized anxiety disorder)    GERD (gastroesophageal reflux disease)    History of falling    multiple times   History of Graves' disease    1987 s/p total thyroidectomy   History of kidney stones    History of scabies    07/ 2014  resolved  History of syncope    per D/C note in epic 02-21-2013 ?vasovagal   History of vertebral compression fracture    05/ 2014 --  L1, no surgical intervention   HLD (hyperlipidemia)    Hyperlipidemia    Hypertension    Hypogonadism in male    IDA (iron deficiency anemia)    Lumbago    OA (osteoarthritis)    OSA on CPAP    cpap machine-settings 17   Osteomyelitis of right wrist ([place]) 11/02/2012   Osteoporosis    Severe   Osteoporosis with fracture 04/24/2013   Pathological fracture due to  osteoporosis with delayed healing 04/24/2013   Persistent atrial fibrillation [facility])    cardiologist--- dr Alfonso Patten. [place]   Plaque psoriasis    dermatologist--- dr Harriett Sine--- currently taking otezla   Post-surgical hypothyroidism    followed by pcp---  s/p total thyroidectomy for graves disease in 1987   Pure hypercholesterolemia 02/10/2021   Renal calculus, right    Right femoral fracture ([place]) 09/29/2012   Right forearm fracture 09/29/2012   Right tibial fracture 02/05/2013   Thrombocytopenia (HCC)    Unsteady gait    uses walker   Wears hearing aid in both ears     Past Surgical History:  Procedure Laterality Date   APPENDECTOMY  2011 approx.    CARDIAC CATHETERIZATION  12-31-2011   dr j. Beatrix Fetters '@[facility]'$    normal LVF w/ multivessel CAD,  occluded RCA w/ colleterals   CATARACT EXTRACTION W/ INTRAOCULAR LENS  IMPLANT, BILATERAL  2016 approx.   COLONOSCOPY     CYSTOSCOPY WITH RETROGRADE PYELOGRAM, URETEROSCOPY AND STENT PLACEMENT Left 11/07/2017   Procedure: CYSTOSCOPY WITH RETROGRADE PYELOGRAM, URETEROSCOPY AND STENT PLACEMENT;  Surgeon: Cleon Gustin, MD;  Location[facility];  Service: Urology;  Laterality: Left;   CYSTOSCOPY WITH RETROGRADE PYELOGRAM, URETEROSCOPY AND STENT PLACEMENT Right 10/22/2019   Procedure: CYSTOSCOPY WITH RETROGRADE PYELOGRAM, URETEROSCOPY AND STENT PLACEMENT;  Surgeon: Cleon Gustin, MD;  Location: [facility];  Service: Urology;  Laterality: Right;   CYSTOSCOPY/URETEROSCOPY/HOLMIUM LASER/STENT PLACEMENT Right 03/11/2017   Procedure: CYSTOSCOPY/URETEROSCOPYSTENT PLACEMENT right ureter retrograde pylegram;  Surgeon: Cleon Gustin, MD;  [facility];  Service: Urology;  Laterality: Right;   HARDWARE REMOVAL  10/05/2012   Procedure: HARDWARE REMOVAL;  Surgeon: [name], MD;  [facility];  Service: Orthopedics;  Laterality: Right;  REMOVING  STRYKER  GAMMA NAIL   HARDWARE REMOVAL Right 07/03/2013   Procedure: HARDWARE  REMOVAL RIGHT TIBIA ;  Surgeon: Rozanna Box, MD;  Location: [place];  Service: Orthopedics;  Laterality: Right;   HIP CLOSED REDUCTION Right 10/15/2013   Procedure: CLOSED MANIPULATION HIP;  Surgeon: [name], MD;  [facility];  Service: Orthopedics;  Laterality: Right;   HOLMIUM LASER APPLICATION Right 0/10/270   Procedure: HOLMIUM LASER APPLICATION;  Surgeon: Cleon Gustin, MD;  [facility];  Service: Urology;  Laterality: Right;   HOLMIUM LASER APPLICATION Left 5/36/6440   Procedure: HOLMIUM LASER APPLICATION;  Surgeon: Cleon Gustin, MD;  [facility];  Service: Urology;  Laterality: Left;   HOLMIUM LASER APPLICATION Left 12/13/7423   Procedure: HOLMIUM LASER APPLICATION;  Surgeon: Cleon Gustin, MD;  [facility];  Service: Urology;  Laterality: Left;   HOLMIUM LASER APPLICATION Right 9/56/3875   Procedure: HOLMIUM LASER APPLICATION;  Surgeon: Cleon Gustin, MD;  Location: [facility];  Service: Urology;  Laterality: Right;   INCISION AND DRAINAGE HIP Right 11/16/2013  Procedure: IRRIGATION AND DEBRIDEMENT RIGHT HIP;  Surgeon: [name], MD;  [facility];  Service: Orthopedics;  Laterality: Right;   INTRAVASCULAR PRESSURE WIRE/FFR STUDY N/A 08/13/2019   Procedure: INTRAVASCULAR PRESSURE WIRE/FFR STUDY;  Surgeon: Nelva Bush, MD;  Location: [place] [facility];  Service: Cardiovascular;  Laterality: N/A;   IR IMAGING GUIDED PORT INSERTION  06/22/2018   IR NEPHROSTOMY PLACEMENT LEFT  03/28/2019   IR URETERAL STENT LEFT NEW ACCESS W/O SEP NEPHROSTOMY CATH  10/24/2017   IR URETERAL STENT LEFT NEW ACCESS W/O SEP NEPHROSTOMY CATH  03/26/2019   NEPHROLITHOTOMY Right 02/08/2017   Procedure: NEPHROLITHOTOMY PERCUTANEOUS WITH SURGEON ACCESS;  Surgeon: Cleon Gustin, MD;  [facility];  Service: Urology;  Laterality: Right;   NEPHROLITHOTOMY Left 10/24/2017   Procedure: NEPHROLITHOTOMY PERCUTANEOUS;  Surgeon: Cleon Gustin, MD;  [facility];  Service: Urology;  Laterality: Left;   NEPHROLITHOTOMY Left 03/26/2019   Procedure: NEPHROLITHOTOMY PERCUTANEOUS;  Surgeon: Cleon Gustin, MD;  [facility];  Service: Urology;  Laterality: Left;  2 HRS   NEPHROLITHOTOMY Left 04/17/2019   Procedure: NEPHROLITHOTOMY PERCUTANEOUS;  Surgeon: Cleon Gustin, MD;  [facility];  Service: Urology;  Laterality: Left;  2 HRS   ORIF TIBIA FRACTURE Right 02/06/2013   Procedure: OPEN REDUCTION INTERNAL FIXATION (ORIF) TIBIA FRACTURE WITH IM ROD FIBULA;  Surgeon: Rozanna Box, MD;  Location: [place];  Service: Orthopedics;  Laterality: Right;   ORIF TIBIA FRACTURE Right 07/03/2013   Procedure: RIGHT TIBIA NON UNION REPAIR ;  Surgeon: Rozanna Box, MD;  Location: [place];  Service: Orthopedics;  Laterality: Right;   ORIF WRIST FRACTURE  10/02/2012   Procedure: OPEN REDUCTION INTERNAL FIXATION (ORIF) WRIST FRACTURE;  Surgeon: Roseanne Kaufman, MD;  [facility];  Service: Orthopedics;  Laterality: Right;  WITH   ANTIBIOTIC  CEMENT   ORIF WRIST FRACTURE Left 10/28/2013   Procedure: OPEN REDUCTION INTERNAL FIXATION (ORIF) WRIST FRACTURE with allograft;  Surgeon: Roseanne Kaufman, MD;  [facility];  Service: Orthopedics;  Laterality: Left;  DVR Plate   QUADRICEPS TENDON REPAIR Left 07/15/2017   Procedure: REPAIR QUADRICEP TENDON;  Surgeon: Frederik Pear, MD;  Location: [place];  Service: Orthopedics;  Laterality: Left;   RIGHT/LEFT HEART CATH AND CORONARY ANGIOGRAPHY N/A 08/13/2019   Procedure: RIGHT/LEFT HEART CATH AND CORONARY ANGIOGRAPHY;  Surgeon: Nelva Bush, MD;  Location: [place] [facility];  Service: Cardiovascular;  Laterality: N/A;   THYROIDECTOMY  02/1986   TOTAL HIP ARTHROPLASTY Right 03-16-2016   '@[facility]'$    TOTAL HIP REVISION  10/05/2012   Procedure: TOTAL HIP REVISION;  Surgeon: [name], MD;  [facility];  Service: Orthopedics;  Laterality: Right;  RIGHT TOTAL HIP REVISION   TOTAL HIP  REVISION Right 09/17/2013   Procedure: REVISION RIGHT TOTAL HIP ARTHROPLASTY ;  Surgeon: [name], MD;  [facility];  Service: Orthopedics;  Laterality: Right;   TOTAL HIP REVISION Right 10/26/2013   Procedure: REVISION RIGHT TOTAL HIP ARTHROPLASTY;  Surgeon: [name], MD;  [facility];  Service: Orthopedics;  Laterality: Right;   TOTAL KNEE ARTHROPLASTY Bilateral right 03-15-2011;  left 06-30-2011   TOTAL KNEE REVISION Left 04/11/2017   Procedure: TOTAL KNEE REVISION PATELLA and TIBIA;  Surgeon: Frederik Pear, MD;  Location: [place];  Service: Orthopedics;  Laterality: Left;     reports that he quit smoking about 10 years ago. His smoking use included cigarettes. He has a 50.00 pack-year  smoking history. He has never used smokeless tobacco. He reports that he does not currently use alcohol. He reports that he does not use drugs.  Allergies  Allergen Reactions   Short Ragweed Pollen Ext Cough    Family History  Problem Relation Age of Onset   CAD Father 48   Asthma Father    Alcohol abuse Father    Arthritis Mother    Alcohol abuse Sister     Prior to Admission medications   Medication Sig Start Date End Date Taking? Authorizing Provider  acetaminophen (TYLENOL) 500 MG tablet Take 1,000 mg by mouth daily as needed for moderate pain.   Yes [provider]  albuterol (VENTOLIN HFA) 108 (90 Base) MCG/ACT inhaler Inhale 2 puffs into the lungs every 6 (six) hours as needed for wheezing or shortness of breath.   Yes [provider]  alfuzosin (UROXATRAL) 10 MG 24 hr tablet TAKE 1 TABLET AT BEDTIME Patient taking differently: 10 mg at bedtime. 03/19/21  Yes McKenzie, Candee Furbish, MD  aspirin EC 81 MG tablet Take 81 mg by mouth daily.   Yes [provider]  atorvastatin (LIPITOR) 80 MG tablet Take 80 mg by mouth daily.   Yes [provider]  buPROPion (WELLBUTRIN XL) 300 MG 24 hr tablet TAKE 1 TABLET EVERY DAY WITH BREAKFAST 02/20/21  Yes  Plotnikov, Evie Lacks, MD  Calcium Citrate-Vitamin D (CALCIUM CITRATE + D PO) Take 1 tablet by mouth 2 (two) times daily. '1200mg'$  of Calcium and 1000units vitamin D3   Yes [provider]  Cholecalciferol (VITAMIN D-3 PO) Take 2,000 Units by mouth 2 (two) times daily.    Yes [provider]  clonazePAM (KLONOPIN) 1 MG tablet Take 1 tablet (1 mg total) by mouth every 6 (six) hours as needed. for anxiety Patient taking differently: Take 1 mg by mouth every 6 (six) hours as needed for anxiety. 08/28/21  Yes Biagio Borg, MD  diltiazem (CARDIZEM CD) 240 MG 24 hr capsule TAKE 1 CAPSULE EVERY DAY 03/23/21  Yes Plotnikov, Evie Lacks, MD  DULoxetine (CYMBALTA) 60 MG capsule TAKE 1 CAPSULE TWICE DAILY 03/23/21  Yes Plotnikov, Evie Lacks, MD  ezetimibe (ZETIA) 10 MG tablet TAKE 1 TABLET EVERY DAY 08/10/21  Yes Skeet Latch, MD  ferrous sulfate 325 (65 FE) MG tablet Take 325 mg by mouth 2 (two) times daily with a meal.    Yes [provider]  hydrochlorothiazide (HYDRODIURIL) 12.5 MG tablet TAKE 1 TABLET (12.5 MG TOTAL) BY MOUTH DAILY. 06/23/21  Yes Loel Dubonnet, NP  HYDROcodone-acetaminophen (NORCO) 7.5-325 MG tablet Take 1 tablet by mouth every 6 (six) hours as needed for severe pain. 09/30/20  Yes Plotnikov, Evie Lacks, MD  levothyroxine (SYNTHROID) 100 MCG tablet Take 100 mcg by mouth daily before breakfast.   Yes [provider]  loratadine (CLARITIN) 10 MG tablet Take 10 mg by mouth daily.    Yes [provider]  losartan (COZAAR) 50 MG tablet TAKE 1 TABLET EVERY DAY 06/18/21  Yes Skeet Latch, MD  Magnesium 500 MG CAPS Take 1 capsule (500 mg total) by mouth daily. Patient taking differently: Take 500 mg by mouth every evening. 10/13/18  Yes Brunetta Genera, MD  metoprolol tartrate (LOPRESSOR) 25 MG tablet Take 1 tablet (25 mg total) by mouth 2 (two) times daily. 08/18/21  Yes Skeet Latch, MD  Multiple Vitamin (MULTIVITAMIN WITH MINERALS) TABS  tablet Take 1 tablet by mouth daily. Men's One-A-Day 50+   Yes [provider]  MYRBETRIQ 25 MG TB24 tablet TAKE 1 TABLET EVERY DAY 10/06/21  Yes McKenzie, Candee Furbish, MD  omeprazole (PRILOSEC) 40 MG capsule TAKE 1 CAPSULE EVERY DAY BEFORE BREAKFAST 06/18/21  Yes Plotnikov, Evie Lacks, MD  Potassium Citrate 15 MEQ (1620 MG) TBCR TAKE 1 TABLET TWICE DAILY 03/19/21  Yes McKenzie, Candee Furbish, MD  Probiotic Product (PROBIOTIC PO) Take 1 capsule by mouth daily with breakfast.   Yes [provider]  rivaroxaban (XARELTO) 10 MG TABS tablet Take 1 tablet (10 mg total) by mouth daily. 06/29/21  Yes Plotnikov, Evie Lacks, MD  solifenacin (VESICARE) 10 MG tablet Take 10 mg by mouth daily. 08/24/21  Yes [provider]  vitamin C (ASCORBIC ACID) 500 MG tablet Take 500 mg by mouth daily.   Yes [provider]  allopurinol (ZYLOPRIM) 100 MG tablet Take 1 tablet (100 mg total) by mouth 2 (two) times daily. 03/19/22   Brunetta Genera, MD  amoxicillin (AMOXIL) 500 MG capsule Take 2,000 mg by mouth daily as needed (dental appointments).    [provider]  denosumab (PROLIA) 60 MG/ML SOSY injection Inject 60 mg as directed every 6 (six) months. Last injection: Feb 2020 Next injection: August 2020 12/10/15   [provider]  dexamethasone (DECADRON) 4 MG tablet Take 5 tablets (20 mg total) by mouth daily. Start the day after carboplatin chemotherapy for 3 days. 03/19/22   Brunetta Genera, MD  EPINEPHrine 0.3 mg/0.3 mL IJ SOAJ injection Inject 0.3 mg into the muscle as needed for anaphylaxis. 07/31/21   [provider]  isosorbide mononitrate (IMDUR) 30 MG 24 hr tablet TAKE 1 TABLET (30 MG TOTAL) BY MOUTH DAILY. 08/10/21   Skeet Latch, MD  mupirocin ointment (BACTROBAN) 2 % On leg wound w/dressing change qd or bid Patient taking differently: Apply 1 application  topically 2 (two) times daily as needed (rash). 01/25/22   Plotnikov, Evie Lacks, MD   nitroGLYCERIN (NITROSTAT) 0.4 MG SL tablet Place 0.4 mg under the tongue every 5 (five) minutes as needed for chest pain. x3 doses as needed for chest pain    [provider]  ondansetron (ZOFRAN) 8 MG tablet Take 1 tablet (8 mg total) by mouth 2 (two) times daily as needed. Start on the 3rd day after cisplatin chemotherapy. 03/19/22   Brunetta Genera, MD  prochlorperazine (COMPAZINE) 10 MG tablet Take 1 tablet (10 mg total) by mouth every 6 (six) hours as needed (Nausea or vomiting). 03/19/22   Brunetta Genera, MD  testosterone enanthate (DELATESTRYL) 200 MG/ML injection INJECT 1ML ('200MG'$ ) INTO THE MUSCLE EVERY 14 DAYS. DISCARD VIAL AFTER 28 DAYS. 09/22/21   Plotnikov, Evie Lacks, MD    Physical Exam: Vitals:   03/19/22 2001  BP: 100/65  Pulse: 92  Resp: 18  Temp: (!) 97.5 F (36.4 C)  TempSrc: Oral  SpO2: 94%  Weight: 111.2 kg  Height: 6' 3.5" (1.918 m)    Physical Exam Vitals and nursing note reviewed.  Constitutional:      General: He is not in acute distress.    Appearance: He is obese. He is not toxic-appearing or diaphoretic.     Comments: Appears chronically ill.  HENT:     Head: Normocephalic and atraumatic.  Cardiovascular:     Rate and Rhythm: Normal rate and regular rhythm.  Pulmonary:     Effort: Pulmonary effort is normal. No respiratory distress.     Breath sounds: No wheezing or rales.  Abdominal:  General: Bowel sounds are normal. There is no distension.     Tenderness: There is no abdominal tenderness. There is no guarding or rebound.     Hernia: No hernia is present.  Musculoskeletal:     Right lower leg: Edema present.     Left lower leg: Edema present.     Comments: Trace bilateral pedal and ankle edema  Skin:    General: Skin is warm and dry.     Capillary Refill: Capillary refill takes less than 2 seconds.  Neurological:     Mental Status: He is alert and oriented to person, place, and time.      Labs on Admission: I have  personally reviewed following labs and imaging studies  CBC: Recent Labs  Lab 03/16/22 1130 03/19/22 1233  WBC 6.3 8.4  NEUTROABS 4.3 5.7  HGB 16.5 16.7  HCT 48.7 48.1  MCV 92.8 91.6  PLT 163 008   Basic Metabolic Panel: Recent Labs  Lab 03/19/22 1233  NA 135  K 4.1  CL 99  CO2 24  GLUCOSE 111*  BUN 38*  CREATININE 2.17*  CALCIUM 10.5*   GFR: Estimated Creatinine Clearance: 39.3 mL/min (A) (by C-G formula based on SCr of 2.17 mg/dL (H)). Liver Function Tests: Recent Labs  Lab 03/19/22 1233  AST 41  ALT 32  ALKPHOS 74  BILITOT 1.4*  PROT 6.7  ALBUMIN 3.7   No results for input(s): "LIPASE", "AMYLASE" in the last 168 hours. No results for input(s): "AMMONIA" in the last 168 hours. Coagulation Profile: No results for input(s): "INR", "PROTIME" in the last 168 hours. Cardiac Enzymes: No results for input(s): "CKTOTAL", "CKMB", "CKMBINDEX", "TROPONINI", "TROPONINIHS" in the last 168 hours. BNP (last 3 results) No results for input(s): "PROBNP" in the last 8760 hours. HbA1C: No results for input(s): "HGBA1C" in the last 72 hours. CBG: No results for input(s): "GLUCAP" in the last 168 hours. Lipid Profile: No results for input(s): "CHOL", "HDL", "LDLCALC", "TRIG", "CHOLHDL", "LDLDIRECT" in the last 72 hours. Thyroid Function Tests: No results for input(s): "TSH", "T4TOTAL", "FREET4", "T3FREE", "THYROIDAB" in the last 72 hours. Anemia Panel: No results for input(s): "VITAMINB12", "FOLATE", "FERRITIN", "TIBC", "IRON", "RETICCTPCT" in the last 72 hours. Urine analysis:    Component Value Date/Time   COLORURINE YELLOW 03/06/2022 1320   APPEARANCEUR HAZY (A) 03/06/2022 1320   LABSPEC 1.015 03/06/2022 1320   PHURINE 7.0 03/06/2022 1320   GLUCOSEU NEGATIVE 03/06/2022 1320   GLUCOSEU NEGATIVE 10/02/2018 1235   HGBUR NEGATIVE 03/06/2022 1320   BILIRUBINUR NEGATIVE 03/06/2022 1320   KETONESUR NEGATIVE 03/06/2022 1320   PROTEINUR TRACE (A) 03/06/2022 1320    UROBILINOGEN 0.2 10/02/2018 1235   NITRITE NEGATIVE 03/06/2022 1320   LEUKOCYTESUR LARGE (A) 03/06/2022 1320    Radiological Exams on Admission: I have personally reviewed images No results found.  EKG: My personal interpretation of EKG shows: no EKG  Assessment/Plan Principal Problem:   AKI (acute kidney injury) ([place]) Active Problems:   Hypothyroidism   Essential hypertension   Morbid obesity ([place])   Fall   Chronic anticoagulation   Chronic obstructive pulmonary disease (HCC)   Diffuse large B-cell lymphoma (HCC)   Chronic diastolic heart failure (HCC)   BPH with obstruction/lower urinary tract symptoms   Paroxysmal atrial fibrillation (HCC)   Stage 3a chronic kidney disease (CKD) (HCC) - Baseline Scr 1.5 - 1.6   DNR (do not resuscitate)/DNI(Do Not Intubate)    Assessment and Plan: * AKI (acute kidney injury) ([place]) Admit to observation bed.  Bladder scan showed only 107 ml in his bladder. Doubt obstructive process the cause of his AKI. Check UA and culture, blood cx x 2. Pt with recent pseudomonas UTI last month.  Start empiric cefepime. Gentle IVF with NS at 100 ml/hr. Repeat BMP in AM.   AKI (acute kidney injury) ([place]) is a Acute illness/condition that poses a threat to life or bodily function.   DNR (do not resuscitate)/DNI(Do Not Intubate) Verified with pt and his husband michael at bedside.  Stage 3a chronic kidney disease (CKD) (HCC) - Baseline Scr 1.5 - 1.6 Acutely worse. Baseline Scr 1.5 - 1.6. hydrate overnight with IVF. Hold HCTZ and ARB.  Paroxysmal atrial fibrillation (HCC) Stable. On cardizem cd 240 mg. On xarelto 10 mg.  BPH with obstruction/lower urinary tract symptoms Chronic. Bladder scan for only 107 ml. Continue uroxatral 10 mg  Chronic diastolic heart failure (HCC) Stable. Not volume overloaded at this point. May be dehydrated given AKI.  Diffuse large B-cell lymphoma ([place]) Pt actively followed by heme/onc. Undergoing chemo.  Chronic  obstructive pulmonary disease (HCC) Stable. On RA.  Chronic anticoagulation Continue with Xarelto. May need to renally adjust this or change to Eliquis. Defer to heme/onc.  Fall Pt with frequent falls at home. PT consult.  Morbid obesity (HCC) Chronic.  Essential hypertension Stable. Hold HCTZ, losartan due to AKI. Continue cardizem CD 240 mg, imdur 30 mg  Hypothyroidism Stable. Continue synthroid 100 mcg daily   DVT prophylaxis: Xarelto Code Status: DNR/DNI(Do NOT Intubate) Family Communication: discussed with pt and pt's husband michael at bedside. Discussed pt's DNR/DNI status and verified that DNR/DNI still applies  Disposition Plan: return home  Consults called: none  Admission status: Observation, Med-Surg   Kristopher Oppenheim, DO [facility] 03/19/2022, 9:22 PM

## 2022-03-19 NOTE — Assessment & Plan Note (Signed)
Pt actively followed by heme/onc. Undergoing chemo.

## 2022-03-19 NOTE — Assessment & Plan Note (Addendum)
Acutely worse. Baseline Scr 1.5 - 1.6. hydrate overnight with IVF. Hold HCTZ and ARB.

## 2022-03-19 NOTE — Assessment & Plan Note (Signed)
Chronic. Bladder scan for only 107 ml. Continue uroxatral 10 mg

## 2022-03-19 NOTE — Progress Notes (Signed)
HEMATOLOGY/ONCOLOGY CLINIC NOTE  Date of Service: .03/19/2022    Patient Care Team: Cassandria Anger, MD as PCP - General (Internal Medicine) Skeet Latch, MD as PCP - Cardiology (Cardiology) Brunetta Genera, MD as Consulting Physician (Hematology) McKenzie, Candee Furbish, MD as Consulting Physician (Urology) Jackquline Denmark, MD as Consulting Physician (Gastroenterology) Skeet Latch, MD as Attending Physician (Cardiology) Lynnell Dike, [place] as Consulting Physician (Optometry) Szabat, Darnelle Maffucci, [facility] as Pharmacist (Pharmacist) Harriett Sine, MD as Consulting Physician (Dermatology)  CHIEF COMPLAINTS/PURPOSE OF CONSULTATION:  Follow-up to discuss management of relapsed large B-cell lymphoma  HISTORY OF PRESENTING ILLNESS:   Please see previous note for details on initial presentation.   INTERVAL HISTORY   [name] is 76 y.o. male is here with his husband for follow-up of his labs large B-cell lymphoma to discuss further evaluation and management. He was recently in the hospital for a pseudomonal UTI with metabolic encephalopathy and altered mental status.  During that hospitalization he did have an MRI of the brain which showed no overt evidence of involvement by lymphoma. Recent PET CT scan shows widespread disease including pulmonary involvement and lymphadenopathy and soft tissue mass over the left buttocks. Patient notes that at home he was discharged with ciprofloxacin which did cover his fluoroquinolone sensitive Pseudomonas UTI. His husband notes that he has gotten confused again after being significantly improved after IV fluids and IV antibiotics. No new headaches.  Increasing fatigue and failure to thrive.  He apparently had to falls in the last 24 hours and is unable to get up the steps into his house. Labs today show acute on chronic kidney injury likely from dehydration versus urinary retention. Patient notes some shortness of breath. We  discussed the urgency of starting him on treatment for his large B-cell lymphoma and to rule out infection. We discussed that if he does not have an obvious reason for continued altered mental status he might need a lumbar puncture.   MEDICAL HISTORY:  Past Medical History:  Diagnosis Date   Anticoagulant long-term use    xarelto--- takes for AFib,  managed by pcp, Dr Alain Marion   Benign localized prostatic hyperplasia with lower urinary tract symptoms (LUTS)    CAD (coronary artery disease) cardiologist--  dr t. Oval Linsey   hx of known CAD obstruction w/ collaterals (cath done @ [facility] 12-31-2011) ;   last cardiac cath 08-13-2019  showed sig. 2V CAD involving proxLAD and CTO of the RCA/  chronic total occlusion midRCA w/ bridging and L>R collaterals   Diastolic CHF ([place])    dx [id number] hospital admission (followed by pcp)   Diffuse large B cell lymphoma [facility]) oncologist-- dr Irene Limbo--- in remission   dx 08/ 2019 -- bx left tonsill mass-- involving lymph nodes--- completed chemotherapy 10-12-2018   DJD (degenerative joint disease)    Dyspnea    Dysthymic disorder    Elevated brain natriuretic peptide (BNP) level    Environmental allergies    GAD (generalized anxiety disorder)    GERD (gastroesophageal reflux disease)    History of falling    multiple times   History of Graves' disease    1987 s/p total thyroidectomy   History of kidney stones    History of scabies    07/ 2014  resolved   History of syncope    per D/C note in epic 02-21-2013 ?vasovagal   History of vertebral compression fracture    05/ 2014 --  L1, no surgical intervention   HLD (  hyperlipidemia)    Hyperlipidemia    Hypertension    Hypogonadism in male    IDA (iron deficiency anemia)    Lumbago    OA (osteoarthritis)    OSA on CPAP    cpap machine-settings 17   Osteoporosis    Severe   Persistent atrial fibrillation [facility])    cardiologist--- dr Alfonso Patten. [place]   Plaque psoriasis    dermatologist--- dr Harriett Sine--- currently taking otezla   Post-surgical hypothyroidism    followed by pcp---  s/p total thyroidectomy for graves disease in 1987   Pure hypercholesterolemia 02/10/2021   Renal calculus, right    Thrombocytopenia ([place])    Unsteady gait    uses walker   Wears hearing aid in both ears     SURGICAL HISTORY: Past Surgical History:  Procedure Laterality Date   APPENDECTOMY  2011 approx.    CARDIAC CATHETERIZATION  12-31-2011   dr j. Beatrix Fetters '@[facility]'$    normal LVF w/ multivessel CAD,  occluded RCA w/ colleterals   CATARACT EXTRACTION W/ INTRAOCULAR LENS  IMPLANT, BILATERAL  2016 approx.   COLONOSCOPY     CYSTOSCOPY WITH RETROGRADE PYELOGRAM, URETEROSCOPY AND STENT PLACEMENT Left 11/07/2017   Procedure: CYSTOSCOPY WITH RETROGRADE PYELOGRAM, URETEROSCOPY AND STENT PLACEMENT;  Surgeon: Cleon Gustin, MD;  Location: [facility];  Service: Urology;  Laterality: Left;   CYSTOSCOPY WITH RETROGRADE PYELOGRAM, URETEROSCOPY AND STENT PLACEMENT Right 10/22/2019   Procedure: CYSTOSCOPY WITH RETROGRADE PYELOGRAM, URETEROSCOPY AND STENT PLACEMENT;  Surgeon: Cleon Gustin, MD;  Location: [facility];  Service: Urology;  Laterality: Right;   CYSTOSCOPY/URETEROSCOPY/HOLMIUM LASER/STENT PLACEMENT Right 03/11/2017   Procedure: CYSTOSCOPY/URETEROSCOPYSTENT PLACEMENT right ureter retrograde pylegram;  Surgeon: Cleon Gustin, MD;  Location: [facility];  Service: Urology;  Laterality: Right;   HARDWARE REMOVAL  10/05/2012   Procedure: HARDWARE REMOVAL;  Surgeon: [name], MD;  Location: [facility];  Service: Orthopedics;  Laterality: Right;  REMOVING  STRYKER  GAMMA NAIL   HARDWARE REMOVAL Right 07/03/2013   Procedure: HARDWARE REMOVAL RIGHT TIBIA ;  Surgeon: Rozanna Box, MD;  Location: [place];  Service: Orthopedics;  Laterality: Right;   HIP CLOSED REDUCTION Right 10/15/2013   Procedure: CLOSED MANIPULATION HIP;  Surgeon: [name], MD;  Location: [facility];  Service: Orthopedics;   Laterality: Right;   HOLMIUM LASER APPLICATION Right 03/15/7845   Procedure: HOLMIUM LASER APPLICATION;  Surgeon: Cleon Gustin, MD;  [facility];  Service: Urology;  Laterality: Right;   HOLMIUM LASER APPLICATION Left 9/62/9528   Procedure: HOLMIUM LASER APPLICATION;  Surgeon: Cleon Gustin, MD;  [facility];  Service: Urology;  Laterality: Left;   HOLMIUM LASER APPLICATION Left 01/10/3243   Procedure: HOLMIUM LASER APPLICATION;  Surgeon: Cleon Gustin, MD;  [facility];  Service: Urology;  Laterality: Left;   HOLMIUM LASER APPLICATION Right 0/07/2724   Procedure: HOLMIUM LASER APPLICATION;  Surgeon: Cleon Gustin, MD;  Location: [facility];  Service: Urology;  Laterality: Right;   INCISION AND DRAINAGE HIP Right 11/16/2013   Procedure: IRRIGATION AND DEBRIDEMENT RIGHT HIP;  Surgeon: [name], MD;  [facility];  Service: Orthopedics;  Laterality: Right;   INTRAVASCULAR PRESSURE WIRE/FFR STUDY N/A 08/13/2019   Procedure: INTRAVASCULAR PRESSURE WIRE/FFR STUDY;  Surgeon: Nelva Bush, MD;  Location: [place] [facility];  Service: Cardiovascular;  Laterality: N/A;   IR IMAGING GUIDED PORT INSERTION  06/22/2018   IR NEPHROSTOMY PLACEMENT LEFT  03/28/2019   IR  URETERAL STENT LEFT NEW ACCESS W/O SEP NEPHROSTOMY CATH  10/24/2017   IR URETERAL STENT LEFT NEW ACCESS W/O SEP NEPHROSTOMY CATH  03/26/2019   NEPHROLITHOTOMY Right 02/08/2017   Procedure: NEPHROLITHOTOMY PERCUTANEOUS WITH SURGEON ACCESS;  Surgeon: Cleon Gustin, MD;  [facility];  Service: Urology;  Laterality: Right;   NEPHROLITHOTOMY Left 10/24/2017   Procedure: NEPHROLITHOTOMY PERCUTANEOUS;  Surgeon: Cleon Gustin, MD;  [facility];  Service: Urology;  Laterality: Left;   NEPHROLITHOTOMY Left 03/26/2019   Procedure: NEPHROLITHOTOMY PERCUTANEOUS;  Surgeon: Cleon Gustin, MD;  [facility];  Service: Urology;  Laterality: Left;  2 HRS   NEPHROLITHOTOMY Left  04/17/2019   Procedure: NEPHROLITHOTOMY PERCUTANEOUS;  Surgeon: Cleon Gustin, MD;  [facility];  Service: Urology;  Laterality: Left;  2 HRS   ORIF TIBIA FRACTURE Right 02/06/2013   Procedure: OPEN REDUCTION INTERNAL FIXATION (ORIF) TIBIA FRACTURE WITH IM ROD FIBULA;  Surgeon: Rozanna Box, MD;  Location: [place];  Service: Orthopedics;  Laterality: Right;   ORIF TIBIA FRACTURE Right 07/03/2013   Procedure: RIGHT TIBIA NON UNION REPAIR ;  Surgeon: Rozanna Box, MD;  Location: [place];  Service: Orthopedics;  Laterality: Right;   ORIF WRIST FRACTURE  10/02/2012   Procedure: OPEN REDUCTION INTERNAL FIXATION (ORIF) WRIST FRACTURE;  Surgeon: Roseanne Kaufman, MD;  [facility];  Service: Orthopedics;  Laterality: Right;  WITH   ANTIBIOTIC  CEMENT   ORIF WRIST FRACTURE Left 10/28/2013   Procedure: OPEN REDUCTION INTERNAL FIXATION (ORIF) WRIST FRACTURE with allograft;  Surgeon: Roseanne Kaufman, MD;  Location[facility];  Service: Orthopedics;  Laterality: Left;  DVR Plate   QUADRICEPS TENDON REPAIR Left 07/15/2017   Procedure: REPAIR QUADRICEP TENDON;  Surgeon: Frederik Pear, MD;  Location: [place];  Service: Orthopedics;  Laterality: Left;   RIGHT/LEFT HEART CATH AND CORONARY ANGIOGRAPHY N/A 08/13/2019   Procedure: RIGHT/LEFT HEART CATH AND CORONARY ANGIOGRAPHY;  Surgeon: Nelva Bush, MD;  Location: [place] [facility];  Service: Cardiovascular;  Laterality: N/A;   THYROIDECTOMY  02/1986   TOTAL HIP ARTHROPLASTY Right 03-16-2016   '@[facility]'$    TOTAL HIP REVISION  10/05/2012   Procedure: TOTAL HIP REVISION;  Surgeon: [name], MD;  [facility];  Service: Orthopedics;  Laterality: Right;  RIGHT TOTAL HIP REVISION   TOTAL HIP REVISION Right 09/17/2013   Procedure: REVISION RIGHT TOTAL HIP ARTHROPLASTY ;  Surgeon: [name], MD;  [facility];  Service: Orthopedics;  Laterality: Right;   TOTAL HIP REVISION Right 10/26/2013   Procedure: REVISION RIGHT TOTAL HIP ARTHROPLASTY;  Surgeon:  [name], MD;  [facility];  Service: Orthopedics;  Laterality: Right;   TOTAL KNEE ARTHROPLASTY Bilateral right 03-15-2011;  left 06-30-2011   TOTAL KNEE REVISION Left 04/11/2017   Procedure: TOTAL KNEE REVISION PATELLA and TIBIA;  Surgeon: Frederik Pear, MD;  Location: [place];  Service: Orthopedics;  Laterality: Left;    SOCIAL HISTORY: Social History   Socioeconomic History   Marital status: Married    Spouse name: Not on file   Number of children: Not on file   Years of education: Not on file   Highest education level: Not on file  Occupational History   Not on file  Tobacco Use   Smoking status: Former    Packs/day: 1.00    Years: 50.00    Total pack years: 50.00    Types: Cigarettes    Quit date: 01/12/2012    Years since  quitting: 10.1   Smokeless tobacco: Never  Vaping Use   Vaping Use: Never used  Substance and Sexual Activity   Alcohol use: Not Currently    Comment: occasional-social   Drug use: Never   Sexual activity: Yes  Other Topics Concern   Not on file  Social History Narrative   [place] 2.5 months, went home Feb 21st slipped and fell on back and developed.  Home PT/OT.  Just started outpatient physical therapy.  Friday night, misstepped.     Social Determinants of Health   Financial Resource Strain: Low Risk  (01/07/2022)   Overall Financial Resource Strain (CARDIA)    Difficulty of Paying Living Expenses: Not hard at all  Food Insecurity: No Food Insecurity (01/07/2022)   Hunger Vital Sign    Worried About Running Out of Food in the Last Year: Never true    Ran Out of Food in the Last Year: Never true  Transportation Needs: No Transportation Needs (01/07/2022)   PRAPARE - Hydrologist (Medical): No    Lack of Transportation (Non-Medical): No  Physical Activity: Sufficiently Active (01/07/2022)   Exercise Vital Sign    Days of Exercise per Week: 3 days    Minutes of Exercise per Session: 60 min  Stress: No Stress  Concern Present (01/07/2022)   [place]    Feeling of Stress : Not at all  Social Connections: [employer] [employer] (01/07/2022)   Social Connection and Isolation Panel [NHANES]    Frequency of Communication with Friends and Family: More than three times a week    Frequency of Social Gatherings with Friends and Family: More than three times a week    Attends Religious Services: 1 to 4 times per year    Active Member of [employer] or Organizations: No    Attends Music therapist: 1 to 4 times per year    Marital Status: Married  Human resources officer Violence: Not At Risk (01/07/2022)   Humiliation, Afraid, Rape, and Kick questionnaire    Fear of Current or Ex-Partner: No    Emotionally Abused: No    Physically Abused: No    Sexually Abused: No    FAMILY HISTORY: Family History  Problem Relation Age of Onset   CAD Father 46   Asthma Father    Alcohol abuse Father    Arthritis Mother    Alcohol abuse Sister     ALLERGIES:  is allergic to short ragweed pollen ext.  MEDICATIONS:  No current outpatient medications on file.   No current facility-administered medications for this visit.    REVIEW OF SYSTEMS:   Fall with injury to the left anterior forehead   PHYSICAL EXAMINATION:  ECOG FS:3 - Symptomatic, >50% confined to bed  Vitals:   03/19/22 1300  BP: (!) 98/59  Pulse: 93  Resp: 17  Temp: (!) 97 F (36.1 C)  SpO2: 91%   Wt Readings from Last 3 Encounters:  03/19/22 251 lb 11.2 oz (114.2 kg)  03/06/22 250 lb 14.1 oz (113.8 kg)  02/25/22 261 lb 3.2 oz (118.5 kg)    Body mass index is 30.64 kg/m.   NAD GENERAL: Fatigued appearing and intermittently confused SKIN: no acute rashes EYES: conjunctiva are pink and non-injected, sclera anicteric OROPHARYNX: MMM, no exudates, no oropharyngeal erythema or ulceration NECK: supple, no JVD LYMPH: Palpable lymphadenopathy in the neck and inguinal  areas LUNGS: clear to auscultation b/l with normal respiratory effort HEART:  regular rate & rhythm ABDOMEN:  normoactive bowel sounds , non tender, not distended. Extremity: Bilateral 2+ chronic pedal edema PSYCH: alert & oriented x 3 with intermittent cognitive slowing NEURO: no focal motor/sensory deficits    LABORATORY DATA:  I have reviewed the data as listed     Latest Ref Rng & Units 03/19/2022   12:33 PM 03/16/2022   11:30 AM 03/07/2022    5:32 AM  CBC  WBC 4.0 - 10.5 K/uL 8.4  6.3  7.5   Hemoglobin 13.0 - 17.0 g/dL 16.7  16.5  16.6   Hematocrit 39.0 - 52.0 % 48.1  48.7  48.7   Platelets 150 - 400 K/uL 173  163  152        Latest Ref Rng & Units 03/19/2022   12:33 PM 03/08/2022    4:38 AM 03/07/2022    5:32 AM  CMP  Glucose 70 - 99 mg/dL 111  119  82   BUN 8 - 23 mg/dL 38  30  22   Creatinine 0.61 - 1.24 mg/dL 2.17  1.51  1.38   Sodium 135 - 145 mmol/L 135  138  133   Potassium 3.5 - 5.1 mmol/L 4.1  3.7  3.7   Chloride 98 - 111 mmol/L 99  104  98   CO2 22 - 32 mmol/L '24  26  25   '$ Calcium 8.9 - 10.3 mg/dL 10.5  9.2  8.6   Total Protein 6.5 - 8.1 g/dL 6.7     Total Bilirubin 0.3 - 1.2 mg/dL 1.4     Alkaline Phos 38 - 126 U/L 74     AST 15 - 41 U/L 41     ALT 0 - 44 U/L 32      . Lab Results  Component Value Date   LDH 343 (H) 03/19/2022   06/28/2019 ECHOCARDIOGRAM COMPLETE (Accession [id number])   05/29/18 Left tonsil biopsy:   RADIOGRAPHIC STUDIES: I have personally reviewed the radiological images as listed and agreed with the findings in the report.   .[place] CORE BIOPSY (SOFT TISSUE)  Result Date: 03/17/2022 INDICATION: History of DLBCL with hypermetabolic LEFT gluteal/flank mass EXAM: ULTRASOUND-GUIDED LEFT GLUTEAL SOFT TISSUE MASS BIOPSY COMPARISON:  CT AP, 03/06/2022.  PET-CT, 03/02/2022 MEDICATIONS: None ANESTHESIA/SEDATION: Local anesthetic and single agent sedation was employed during this procedure. A total of fentanyl 50 mcg was administered  intravenously. The patient's level of consciousness and vital signs were monitored continuously by radiology nursing throughout the procedure under my direct supervision. COMPLICATIONS: None immediate. TECHNIQUE: Informed written consent was obtained from the patient and/or patient's representative after a discussion of the risks, benefits and alternatives to treatment. Questions regarding the procedure were encouraged and answered. Initial ultrasound scanning demonstrated hypervascular subcutaneous LEFT gluteal soft tissue mass. An ultrasound image was saved for documentation purposes. The procedure was planned. A timeout was performed prior to the initiation of the procedure. The operative was prepped and draped in the usual sterile fashion, and a sterile drape was applied covering the operative field. A timeout was performed prior to the initiation of the procedure. Local anesthesia was provided with 1% lidocaine with epinephrine. Under direct ultrasound guidance, an 18 gauge core needle device was utilized to obtain to obtain 4 core needle biopsies of the LEFT gluteal soft tissue mass. The samples were placed in saline and submitted to pathology. The needle was removed and hemostasis was achieved with manual compression. Post procedure scan was negative for significant hematoma. A dressing was  placed. The patient tolerated the procedure well without immediate postprocedural complication. IMPRESSION: Successful ultrasound guided biopsy of LEFT gluteal soft tissue mass, as above. Michaelle Birks, MD [facility] Electronically Signed   By: Michaelle Birks M.D.   On: 03/17/2022 09:32   MR BRAIN W WO CONTRAST  Result Date: 03/07/2022 CLINICAL DATA:  Brain metastases suspected EXAM: MRI HEAD WITHOUT AND WITH CONTRAST TECHNIQUE: Multiplanar, multiecho pulse sequences of the brain and surrounding structures were obtained without and with intravenous contrast.  CONTRAST:  57m GADAVIST GADOBUTROL 1 MMOL/ML IV SOLN COMPARISON:  PET CT from 5 days ago FINDINGS: Brain: No enhancement or swelling to suggest metastatic disease. Small FLAIR hyperintensities in the cerebral white matter attributed to chronic small vessel ischemia. No acute infarct, hydrocephalus, or collection. Cerebral volume loss which is generalized. Vascular: Slow flow in the right transverse and sigmoid dural sinuses when comparing T2 and postcontrast imaging, attributed to tumoral compression in the right neck. Skull and upper cervical spine: No skull base erosion or visible bony metastasis. Sinuses/Orbits: Right mastoid opacification associated with a nasopharyngeal mass which is right eccentric. Other: Nasopharyngeal mass crossing midline but bulky towards the right with length of 6 cm and thickness of 18 mm. There is contact of the carotid and parapharyngeal spaces on the right. No evidence of intracranial extension IMPRESSION: 1. 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 No skull base erosion or intracranial extension. 2. Right mastoid effusion. Electronically Signed   By: JJorje GuildM.D.   On: 03/07/2022 11:53   CT ABDOMEN PELVIS W CONTRAST  Result Date: 03/06/2022 CLINICAL DATA:  Urinary frequency. Undergoing treatment for urinary tract infection without improvement. Clinical concern for renal mass or abscess. Undergoing chemotherapy for diffuse large B cell lymphoma. EXAM: CT ABDOMEN AND PELVIS WITH CONTRAST TECHNIQUE: Multidetector CT imaging of the abdomen and pelvis was performed using the standard protocol following bolus administration of intravenous contrast. RADIATION DOSE REDUCTION: This exam was performed according to the departmental dose-optimization program which includes automated exposure control, adjustment of the mA and/or kV according to patient size and/or use of iterative reconstruction technique. CONTRAST:  1035mOMNIPAQUE  IOHEXOL 300 MG/ML  SOLN COMPARISON:  PET-CT dated 03/02/2022 and abdomen and pelvis CT dated 09/05/2019. FINDINGS: Lower chest: The 4.3 x 3.9 cm right lower lobe lung mass seen on 03/02/2022 currently measures 4.7 x 4.4 cm on image number 15/4. There is interval visualization of a 5 mm similar appearing nodule adjacent to this mass posteriorly and superiorly on image number 13/4. There is also bronchial mucous plugging at the posterior, inferior aspect of the mass, without significant change. The heart remains mildly enlarged. Minimal pericardial effusion with a maximum thickness of 6 mm. Atheromatous calcifications, including the coronary arteries and aorta. Hepatobiliary: Unremarkable liver. Mild sludge or noncalcified gallstones in the dependent portion of the gallbladder. No gallbladder wall thickening or pericholecystic fluid. Pancreas: Moderate diffuse pancreatic atrophy. Spleen: Normal in size without focal abnormality. Adrenals/Urinary Tract: Low-density right adrenal nodule with medium density thin internal septations in the superior aspect of the right adrenal gland, measuring 2.9 x 1.5 cm on coronal image number 47, without significant change since 09/05/2019. No increased activity on the recent PET-CT. Unremarkable left adrenal gland. Tiny lower pole right renal calculus. 4 mm mid left renal calculus. Small mid left renal cyst. No bladder or ureteral calculi no hydronephrosis. The distal right ureter is obscured by streak artifacts from patient's right hip prosthesis.  Stomach/Bowel: Distended rectum containing stool and gas. Moderate stool throughout the remainder of the colon. Unremarkable stomach and small bowel. The appendix is not visualized and surgically absent by history. Vascular/Lymphatic: Atheromatous arterial calcifications without aneurysm. Enlarged, recently hypermetabolic left inguinal lymph node with a short axis diameter 19 mm on image number 98/2. Multiple additional enlarged left pelvic  lymph nodes, including a common femoral node with a short axis diameter of 31 mm on image number 84/2 and no obturator node with a short axis diameter of 27 mm on image number 81/2. Reproductive: Prostate is unremarkable. Other: Small to moderate-sized ventral hernia containing fat small to moderate-sized infraumbilical hernia containing a small to moderate-sized supraumbilical hernia containing fat without change. Small left inguinal hernia containing fat. Musculoskeletal: Right hip prosthesis with associated streak artifact. Severe L1 vertebral compression deformity with bony retropulsion causing moderate to marked canal stenosis without significant change. Associated acute kyphosis without significant change. No acute fractures or acute subluxations. Mild anterior spur formation at multiple levels in the lower thoracic spine and moderate to large anterior spurs at the anterior T12 and L2 levels on both sides of the severely compressed L1 vertebra. Stable anterior subluxation of the lumbar spine inferior to the L1 fracture relative to the spine above the fracture. Marked right psoas, iliacus and iliopsoas muscle atrophy. IMPRESSION: 1. No renal mass or abscess. 2. Small, nonobstructing calculus in each kidney. 3. Mild increase in size of the previously demonstrated right lower lobe hypermetabolic mass with an interval small satellite nodule posteriorly. This is concerning for a primary lung neoplasm or lymphoma involving the lung. 4. Recently demonstrated left pelvic and inguinal adenopathy, most likely due to the patient's known lymphoma. 5. Stable small to moderate-sized supraumbilical hernia containing herniated fat. 6. Stable right adrenal probable adenoma. Electronically Signed   By: Claudie Revering M.D.   On: 03/06/2022 16:39   DG Chest Port 1 View  Result Date: 03/06/2022 CLINICAL DATA:  Possible sepsis. EXAM: PORTABLE CHEST 1 VIEW COMPARISON:  10/13/2021. FINDINGS: Cardiac silhouette is normal in size. No  mediastinal or hilar masses. Right anterior chest wall Port-A-Cath is stable, tip in the mid superior vena cava. Chronic prominence of the interstitial markings bilaterally. Lungs otherwise clear. No pleural effusion or pneumothorax. Skeletal structures are grossly intact. IMPRESSION: No active disease. Electronically Signed   By: Lajean Manes M.D.   On: 03/06/2022 15:04   NM PET Image Restag (PS) Skull Base To Thigh  Result Date: 03/02/2022 CLINICAL DATA:  Follow-up treatment strategy for diffuse large B-cell lymphoma. EXAM: NUCLEAR MEDICINE PET SKULL BASE TO THIGH TECHNIQUE: 0.8 mCi F-18 FDG was injected intravenously. Full-ring PET imaging was performed from the skull base to thigh after the radiotracer. CT data was obtained and used for attenuation correction and anatomic localization. Fasting blood glucose: 107 mg/dl COMPARISON:  PET-CT dated November 22, 2018; PET-CT dated August 22, 2018 FINDINGS: Mediastinal blood pool activity: SUV max 2.8 Liver activity: SUV max 3.8 NECK: New marked hypermetabolic soft tissue thickening of the right posterior nasopharynx with SUV max of 20.5. Large lymph node located anterior to the right sternocleidomastoid muscle unchanged in size, measuring 2.2 cm in short axis, and demonstrates similar mild FDG uptake, SUV max of 2.6, previously 2.4. New hypermetabolic subcentimeter right level IIb lymph node measuring 5 mm on series 4, image 34. Incidental CT findings: none CHEST: New hypermetabolic right lower lobe lung mass measuring 3.9 x 4.3 cm with SUV max of 12.4. Incidental CT findings: Left main and  three-vessel coronary artery calcifications. Atherosclerotic disease of the thoracic aorta. Right chest wall port with tip in the mid SVC. ABDOMEN/PELVIS: Enlarged hypermetabolic left pelvic and left inguinal lymph nodes. Reference left pelvic sidewall lymph node measuring 2.8 cm in short axis on series 4, image 190 with SUV max of 33.5. Enlarged left inguinal lymph node  measuring 1.9 cm in short axis on series 4, image 207 with SUV max of hypermetabolic soft tissue nodules and masses of the left buttocks. Largest measures 7.7 x 4.1 cm on series 4, image 189 with SUV max of 18.5. Incidental CT findings: Simple appearing cyst of the left kidney, no further follow-up imaging is recommended for this lesion. Nonobstructing stone of the left kidney. Atherosclerotic disease of the abdominal aorta. Moderate fat containing umbilical hernia. Air is seen in the urinary bladder. SKELETON: No suspicious focal uptake to suggest osseous metastatic disease. Incidental CT findings: Prior total left hip arthroplasty with cerclage wires and surrounding hypermetabolic activity, similar to prior exam. Unchanged severe compression deformity at L1. Spinous process fractures with associated FDG uptake extending from approximately the T8-T1 levels. IMPRESSION: 1. New marked hypermetabolic soft tissue thickening of the right posterior nasopharynx. 2. New hypermetabolic subcentimeterright level IIb lymph node. 3. New hypermetabolic right lower lobe lung mass. 4. New enlarged and hypermetabolic left pelvic and left inguinal lymph nodes. 5. New hypermetabolic soft tissue nodules and masses of the left buttocks. 6. Enlarged lymph node located anterior to the right sternocleidomastoid muscle is unchanged in size and demonstrates similar FDG uptake, likely treated disease. 7. Spinous process fractures with associated FDG uptake extending from approximately the T8-T1 levels. Correlate for history of recent trauma. 8. Air is seen in the urinary bladder, possibly due to recent instrumentation or infection. Correlate with urinalysis. Electronically Signed   By: Yetta Glassman M.D.   On: 03/02/2022 15:53   CT Head Wo Contrast  Result Date: 02/24/2022 CLINICAL DATA:  Head trauma. EXAM: CT HEAD WITHOUT CONTRAST TECHNIQUE: Contiguous axial images were obtained from the base of the skull through the vertex without  intravenous contrast. RADIATION DOSE REDUCTION: This exam was performed according to the departmental dose-optimization program which includes automated exposure control, adjustment of the mA and/or kV according to patient size and/or use of iterative reconstruction technique. COMPARISON:  Head CT dated 11/08/2018. FINDINGS: Brain: Mild age-related atrophy and chronic microvascular ischemic changes. There is no acute intracranial hemorrhage. No mass effect or midline shift. No extra-axial fluid collection. Vascular: No hyperdense vessel or unexpected calcification. Skull: Normal. Negative for fracture or focal lesion. Sinuses/Orbits: The visualized paranasal sinuses and the left mastoid air cells are clear. Mild right mastoid effusions. Other: None IMPRESSION: 1. No acute intracranial pathology. 2. Mild age-related atrophy and chronic microvascular ischemic changes. Electronically Signed   By: Anner Crete M.D.   On: 02/24/2022 20:35    ASSESSMENT & PLAN:  76 y.o. male with  1. Diffuse Large B-Cell Lymphoma,- Stage III Left tonsil -05/29/18 Left tonsil biopsy which revealed Diffuse Large B-Cell Lymphoma, which requires treatment  -06/07/18 LDH at 214, will monitor change through treatment.  -Baseline ECHO 06/14/18: EF 50-55% -Standard regimen of R-CHOP with G-CSF support for 6 cycles starting 06/23/18  06/16/18 PET/CT revealed 1. Deauville 5 hypermetabolic lesions in the palatine tonsils, left internal jugular chain, and involving a right inguinal lymph node. Deauville 4 involvement of a left inguinal lymph node and an enlarged subcutaneous lesion favoring lymph node superficial to the lower right sternocleidomastoid muscle in the neck. 2.  Other imaging findings of potential clinical significance: Aortic Atherosclerosis. Coronary atherosclerosis. Emphysema. Bilateral nonobstructive nephrolithiasis. Umbilical hernia contains adipose tissues. Old right rib fractures some of which are nonunited. Vertebra plana  at L1.   08/22/18 PET/CT revealed Interval response to therapy. Overall Deauville criteria 3. Interval decrease in size and FDG uptake associated with palatine tonsil lesions. There has been resolution of previous left level-II and bilateral inguinal lymph nodes. 2. Enlarged subcutaneous lesion superficial to the lower right sternocleidomastoid muscle exhibits decreased FDG uptake. Currently Deauville criteria 2. 3. No new or progressive sites of disease identified. 4.  Aortic Atherosclerosis. 5. Multi vessel coronary artery atherosclerotic calcifications. 6. Unchanged vertebra plana deformity. 7. Kidney stones.   S/p 6 cycles of R-CHOP completed on 10/12/18  11/22/18 PET/CT revealed The tonsillar mass has essentially resolved. The large presumed lymph node superficial to the right sternocleidomastoid is stable in size at 2.3 cm in short axis, with maximum SUV of 3.3 (formerly 2.4), representing Deauville 3 activity, previously Deauville 2. 2. Other imaging findings of potential clinical significance: Aortic Atherosclerosis and Emphysema. Coronary atherosclerosis. Bilateral nonobstructive nephrolithiasis. Umbilical hernia contains adipose tissue. Chronic high activity posterior to the right hip implant along a region of bony irregularity and deformity probably reflecting chronic inflammation. L1 vertebra plana.  2.  Recurrent large B-cell lymphoma with left buttock soft tissue mass, pulmonary mass, widespread lymphadenopathy  #3 acute on chronic renal insufficiency likely from dehydration cannot rule out urinary retention given patient's chronic bladder issues.  Patient follows with Dr. Junious Silk of urology  #4 altered mental status and failure to thrive in the context of poor p.o. intake and recent recurrent UTI  Recent MRI of the brain showed no evidence of lymphoma involvement  Plan -Labs done today were discussed in detail with the patient and show acute on chronic renal insufficiency. -Recent  PET/CT scan showing widespread recurrence of patient's lymphoma was again reviewed with them -Core needle biopsy of the soft tissue mass on the left buttocks confirms recurrence of large B-cell lymphoma. -Patient has failure to thrive with decreased p.o. intake and inability get up the steps into his house and recurrent falls with some injury to his left forehead. -We will admit patient to the hospitalist service.  Discussed the patient in detail with Dr. Marylyn Ishihara. -He will need IV fluids to address his acute renal injury as well as consideration of bladder scan and possible Foley's catheter placement due to concerns for possible urine retention. -Repeat UA UC -Monitoring for delirium. -Chest x-ray to evaluate for postobstructive pneumonia. -If patient has cognitive issues despite control of infection might need to consider lumbar puncture. -Please consider CT head without contrast to rule out fall and head injury related trauma. -Please consider starting the patient on dexamethasone 20 mg p.o. daily over the weekend for his lymphoma.  No contraindications. -Once infectious issues are ruled out and the patient has fluids over the weekend we will plan to start the patient on    R-GCD treatment for relapsed large B-cell lymphoma.  Plan to do first treatment as inpatient. -Will need evaluation by PT and OT for evaluation of discharge needs. -Concerning situation and limitations to lymphoma treatments were discussed and addressed in detail with the patient and his husband. -Oncology will continue to follow Patient is being admitted directly to the hospitalist service with oncology consulting  The total time spent in the appointment was 40 minutes*.  All of the patient's questions were answered with apparent satisfaction. The patient knows to  call the clinic with any problems, questions or concerns.   Sullivan Lone MD MS AAHIVMS [facility] Hematology/Oncology Physician [facility]  .*Total  Encounter Time as defined by the Centers for Medicare and Medicaid Services includes, in addition to the face-to-face time of a patient visit (documented in the note above) non-face-to-face time: obtaining and reviewing outside history, ordering and reviewing medications, tests or procedures, care coordination (communications with other health care professionals or caregivers) and documentation in the medical record.        Patient Active Problem List   Diagnosis Date Noted   Acute metabolic encephalopathy 75/17/0017   Lung mass 03/06/2022   Stage 3a chronic kidney disease (CKD) ([place]) 03/06/2022   Allergic rhinitis 01/25/2022   Allergic rhinitis due to animal (cat) (dog) hair and dander 01/25/2022   Allergic rhinitis due to pollen 01/25/2022   Mild intermittent asthma 01/25/2022   Vasomotor rhinitis 01/25/2022   Multiple skin nodules 01/25/2022   Weight loss 01/25/2022   Pure hypercholesterolemia 02/10/2021   Chronic diastolic heart failure ([place]) 06/21/2019   H/O nephrolithotomy with removal of calculi 03/26/2019   Renal calculus 03/26/2019   History of chemotherapy 11/21/2018   Lymphoma in remission ([place]) 11/21/2018   Generalized weakness 11/08/2018   Well adult exam 10/02/2018   Counseling regarding advance care planning and goals of care 07/09/2018   Diffuse large B-cell lymphoma ([place]) 06/23/2018   Port-A-Cath in place 06/23/2018   Hypogonadism in male 11/22/2017   Nephrolithiasis 10/24/2017   Recurrent dislocation of left patella 07/15/2017   Recurrent subluxation of patella, left 07/08/2017   At high risk for falls 05/17/2017   S/P revision of total knee 04/11/2017   Failed total knee, left, initial encounter ([place]) 04/09/2017   Renal calculus, right 02/08/2017   Protein-calorie malnutrition, severe ([place]) 12/08/2016   Chronic obstructive pulmonary disease ([place]) 12/08/2016   Staghorn renal calculus 11/05/2016   Senile osteoporosis 06/08/2016   Dyslipidemia 01/12/2016    Insomnia secondary to depression with anxiety 12/02/2015   Generalized anxiety disorder 11/19/2015   Wound drainage right hip 11/15/2013   Expected blood loss anemia 09/18/2013   Hyponatremia 09/18/2013   S/P right TH revision 09/17/2013   Benign prostatic hyperplasia without lower urinary tract symptoms 08/03/2013   History of gastroesophageal reflux (GERD) 08/03/2013   OSA (obstructive sleep apnea) 08/03/2013   Paroxysmal atrial fibrillation ([place]) 08/03/2013   Presence of unspecified artificial hip joint 08/03/2013   Nonunion, fracture, Right tibia  07/03/2013   UTI (urinary tract infection) 07/03/2013   Pathological fracture due to osteoporosis with delayed healing 04/24/2013   Osteoporosis with fracture 04/24/2013   Rash and nonspecific skin eruption 03/08/2013   Syncope 02/21/2013   Chronic anticoagulation 02/21/2013   Fall 02/08/2013   Physical deconditioning 02/08/2013   Low back pain radiating to both legs 02/08/2013   Compression fracture of L1 lumbar vertebra ([place]) 02/08/2013   Right tibial fracture 02/05/2013   Morbid obesity ([place]) 02/05/2013   Osteomyelitis of right wrist ([place]) 11/02/2012   Anemia 10/06/2012   Essential hypertension 10/03/2012   Psoriasis 10/03/2012   Vitamin D deficiency 10/03/2012   GERD (gastroesophageal reflux disease) 10/03/2012   Anxiety 10/03/2012   Depression 10/03/2012   CAD (coronary artery disease) 10/03/2012   DJD (degenerative joint disease) 10/03/2012   Chronic gingivitis 10/03/2012   Low testosterone, possible hypogonadism 10/01/2012   Normocytic anemia 09/30/2012   Right femoral fracture ([place]) 09/29/2012   Right forearm fracture 09/29/2012   Longstanding persistent atrial fibrillation (  [place]) 09/29/2012   Hypothyroidism 09/29/2012   History of recurrent UTIs 09/29/2012   FOLLOW UP: Pet/ct in 5 days US guided biopsy of left lateral gluteal lesion in 3 days Return to clinic with Dr. Irene Limbo in 2 weeks  The total time spent in the  appointment was 31 minutes* All of the patient's questions were answered with apparent satisfaction. The patient knows to call the clinic with any problems, questions or concerns.   Sullivan Lone MD MS AAHIVMS [facility] Hematology/Oncology Physician [facility]  .*Total Encounter Time as defined by the Centers for Medicare and Medicaid Services includes, in addition to the face-to-face time of a patient visit (documented in the note above) non-face-to-face time: obtaining and reviewing outside history, ordering and reviewing medications, tests or procedures, care coordination (communications with other health care professionals or caregivers) and documentation in the medical record.

## 2022-03-19 NOTE — Progress Notes (Signed)
DISCONTINUE ON PATHWAY REGIMEN - Lymphoma and CLL     A cycle is every 21 days:     Prednisone      Rituximab      Cyclophosphamide      Doxorubicin      Vincristine   **Always confirm dose/schedule in your pharmacy ordering system**  REASON: Disease Progression PRIOR TREATMENT: [id number]: R(IV)-CHOP q21 Days x 6 Cycles TREATMENT RESPONSE: Complete Response (CR)  START OFF PATHWAY REGIMEN - Lymphoma and CLL   [id number]:R-GDP (Rituximab IV + Gemcitabine IV + Dexamethasone PO + Cisplatin IV) q21 Days:   A cycle is every 21 days:     Dexamethasone      Rituximab-xxxx      Gemcitabine      Cisplatin   **Always confirm dose/schedule in your pharmacy ordering system**  Patient Characteristics: Diffuse Large B-Cell Lymphoma or Follicular Lymphoma, Grade 3B, Relapsed / Refractory, All Stages,  Second Line, Relapse > 12 Months From Prior Chemoimmunotherapy, Not a Transplant Candidate, Candidate for CAR T-Cell Therapy, Treating as Specialist or  Consulting with Specialist Disease Type: Not Applicable Disease Type: Diffuse Large B-Cell Lymphoma Disease Type: Not Applicable Line of therapy: Relapsed / Refractory - Second Line Time to Relapse: Relapse > 12 Months from Prior Chemoimmunotherapy Patient Characteristics: Not a Transplant Candidate Patient Characteristics: Candidate for CAR T-Cell Therapy Please indicate whether you are: A specialist Intent of Therapy: Curative Intent, Discussed with Patient

## 2022-03-19 NOTE — Subjective & Objective (Signed)
CC: direct admission for AKI HPI: 76 year old white male history of B-cell lymphoma, hypertension, BPH, hypothyroidism, paroxysmal atrial fibrillation on Xarelto, chronic diastolic heart failure, CKD stage IIIa baseline creatinine 1.5-1.6, presents to the hospital as a direct admission.  Patient seen in oncology clinic today.  Patient has been having chills at home for couple days.  No documented fevers.  He has been having increasing urinary frequency going about every hour.  Occasional dysuria.  Patient does have a history of BPH.  Is on Uroxatrol.  Was seen in clinic today.  He complained to his oncologist about fatigue and failure to thrive.  He has been having falls at home.  Recent PET scan showed widespread pulmonary involvement of his B-cell lymphoma.  Labs are drawn in clinic.  Creatinine was 2.17.  Baseline 1.5-1.6.  BUN of 38 baseline approximately 22-37.  White count 8.4, hemoglobin 16.7, platelets 173.  Direct admission was requested by oncology.  Patient went home.  Patient's husband [name] states they were called around 4 to 5:00 PM today that the bed is ready.  He showed up to the hospital around 8 PM for admission.  His husband [name] states that after he got home from oncology clinic, he had another fall at home.  They did call EMS in order to pick him up and placed him into the car to bring him to the hospital for direct admission.

## 2022-03-19 NOTE — Assessment & Plan Note (Signed)
Chronic. 

## 2022-03-19 NOTE — Progress Notes (Signed)
Pharmacy Antibiotic Note  [name] is a 76 y.o. male admitted on 03/19/2022 with AKI.  History significant for pseudomonas UTI last month. Pharmacy has been consulted for cefepime dosing.  Plan: Cefepime 2 g IV every 12 hours Monitor clinical progress, renal function F/U C&S, abx deescalation / LOT   Height: 6' 3.5" (191.8 cm) Weight: 111.2 kg (245 lb 2.4 oz) IBW/kg (Calculated) : 85.65  Temp (24hrs), Avg:97.3 F (36.3 C), Min:97 F (36.1 C), Max:97.5 F (36.4 C)  Recent Labs  Lab 03/16/22 1130 03/19/22 1233  WBC 6.3 8.4  CREATININE  --  2.17*    Estimated Creatinine Clearance: 39.3 mL/min (A) (by C-G formula based on SCr of 2.17 mg/dL (H)).    Allergies  Allergen Reactions   Short Ragweed Pollen Ext Cough    Antimicrobials this admission: 6/9 cefepime >>   Dose adjustments this admission:   Microbiology results: 6/9 BCx: ordered 6/9 UCx: ordered    Thank you for allowing pharmacy to be a part of this patient's care.  Royetta Asal, PharmD, BCPS Clinical Pharmacist [place] Please utilize Amion for appropriate phone number to reach the unit pharmacist ([place]) 03/19/2022 9:31 PM

## 2022-03-19 NOTE — Assessment & Plan Note (Signed)
Stable. Hold HCTZ, losartan due to AKI. Continue cardizem CD 240 mg, imdur 30 mg

## 2022-03-19 NOTE — Assessment & Plan Note (Signed)
Pt with frequent falls at home. PT consult.

## 2022-03-19 NOTE — Assessment & Plan Note (Addendum)
Admit to observation bed. Bladder scan showed only 107 ml in his bladder. Doubt obstructive process the cause of his AKI. Check UA and culture, blood cx x 2. Pt with recent pseudomonas UTI last month.  Start empiric cefepime. Gentle IVF with NS at 100 ml/hr. Repeat BMP in AM.   AKI (acute kidney injury) ([place]) is a Acute illness/condition that poses a threat to life or bodily function.

## 2022-03-19 NOTE — Assessment & Plan Note (Signed)
Continue with Xarelto. May need to renally adjust this or change to Eliquis. Defer to heme/onc.

## 2022-03-19 NOTE — Assessment & Plan Note (Signed)
Stable. On RA. ?

## 2022-03-19 NOTE — Assessment & Plan Note (Signed)
Stable. On cardizem cd 240 mg. On xarelto 10 mg.

## 2022-03-20 ENCOUNTER — Encounter (HOSPITAL_COMMUNITY): Payer: Self-pay | Admitting: Internal Medicine

## 2022-03-20 DIAGNOSIS — N401 Enlarged prostate with lower urinary tract symptoms: Secondary | ICD-10-CM | POA: Diagnosis present

## 2022-03-20 DIAGNOSIS — W1830XA Fall on same level, unspecified, initial encounter: Secondary | ICD-10-CM | POA: Diagnosis present

## 2022-03-20 DIAGNOSIS — I4819 Other persistent atrial fibrillation: Secondary | ICD-10-CM | POA: Diagnosis present

## 2022-03-20 DIAGNOSIS — I2582 Chronic total occlusion of coronary artery: Secondary | ICD-10-CM | POA: Diagnosis present

## 2022-03-20 DIAGNOSIS — M810 Age-related osteoporosis without current pathological fracture: Secondary | ICD-10-CM | POA: Diagnosis present

## 2022-03-20 DIAGNOSIS — G4733 Obstructive sleep apnea (adult) (pediatric): Secondary | ICD-10-CM | POA: Diagnosis present

## 2022-03-20 DIAGNOSIS — F32A Depression, unspecified: Secondary | ICD-10-CM | POA: Diagnosis present

## 2022-03-20 DIAGNOSIS — R627 Adult failure to thrive: Secondary | ICD-10-CM | POA: Diagnosis present

## 2022-03-20 DIAGNOSIS — L400 Psoriasis vulgaris: Secondary | ICD-10-CM | POA: Diagnosis present

## 2022-03-20 DIAGNOSIS — E039 Hypothyroidism, unspecified: Secondary | ICD-10-CM

## 2022-03-20 DIAGNOSIS — E669 Obesity, unspecified: Secondary | ICD-10-CM | POA: Diagnosis present

## 2022-03-20 DIAGNOSIS — C8330 Diffuse large B-cell lymphoma, unspecified site: Secondary | ICD-10-CM | POA: Diagnosis not present

## 2022-03-20 DIAGNOSIS — Z5111 Encounter for antineoplastic chemotherapy: Secondary | ICD-10-CM | POA: Diagnosis not present

## 2022-03-20 DIAGNOSIS — R296 Repeated falls: Secondary | ICD-10-CM | POA: Diagnosis present

## 2022-03-20 DIAGNOSIS — I2510 Atherosclerotic heart disease of native coronary artery without angina pectoris: Secondary | ICD-10-CM | POA: Diagnosis present

## 2022-03-20 DIAGNOSIS — R918 Other nonspecific abnormal finding of lung field: Secondary | ICD-10-CM | POA: Diagnosis not present

## 2022-03-20 DIAGNOSIS — Z7901 Long term (current) use of anticoagulants: Secondary | ICD-10-CM | POA: Diagnosis not present

## 2022-03-20 DIAGNOSIS — C8339 Diffuse large B-cell lymphoma, extranodal and solid organ sites: Secondary | ICD-10-CM | POA: Diagnosis present

## 2022-03-20 DIAGNOSIS — N390 Urinary tract infection, site not specified: Secondary | ICD-10-CM | POA: Diagnosis not present

## 2022-03-20 DIAGNOSIS — R4182 Altered mental status, unspecified: Secondary | ICD-10-CM | POA: Diagnosis not present

## 2022-03-20 DIAGNOSIS — N1831 Chronic kidney disease, stage 3a: Secondary | ICD-10-CM | POA: Diagnosis present

## 2022-03-20 DIAGNOSIS — R590 Localized enlarged lymph nodes: Secondary | ICD-10-CM | POA: Diagnosis present

## 2022-03-20 DIAGNOSIS — E8809 Other disorders of plasma-protein metabolism, not elsewhere classified: Secondary | ICD-10-CM | POA: Diagnosis present

## 2022-03-20 DIAGNOSIS — N189 Chronic kidney disease, unspecified: Secondary | ICD-10-CM | POA: Diagnosis not present

## 2022-03-20 DIAGNOSIS — K219 Gastro-esophageal reflux disease without esophagitis: Secondary | ICD-10-CM | POA: Diagnosis present

## 2022-03-20 DIAGNOSIS — E890 Postprocedural hypothyroidism: Secondary | ICD-10-CM | POA: Diagnosis present

## 2022-03-20 DIAGNOSIS — J449 Chronic obstructive pulmonary disease, unspecified: Secondary | ICD-10-CM | POA: Diagnosis not present

## 2022-03-20 DIAGNOSIS — I130 Hypertensive heart and chronic kidney disease with heart failure and stage 1 through stage 4 chronic kidney disease, or unspecified chronic kidney disease: Secondary | ICD-10-CM | POA: Diagnosis present

## 2022-03-20 DIAGNOSIS — Z974 Presence of external hearing-aid: Secondary | ICD-10-CM | POA: Diagnosis not present

## 2022-03-20 DIAGNOSIS — N179 Acute kidney failure, unspecified: Secondary | ICD-10-CM | POA: Diagnosis present

## 2022-03-20 DIAGNOSIS — Z66 Do not resuscitate: Secondary | ICD-10-CM | POA: Diagnosis present

## 2022-03-20 DIAGNOSIS — F411 Generalized anxiety disorder: Secondary | ICD-10-CM | POA: Diagnosis present

## 2022-03-20 DIAGNOSIS — I5032 Chronic diastolic (congestive) heart failure: Secondary | ICD-10-CM | POA: Diagnosis present

## 2022-03-20 DIAGNOSIS — R591 Generalized enlarged lymph nodes: Secondary | ICD-10-CM | POA: Diagnosis not present

## 2022-03-20 DIAGNOSIS — D696 Thrombocytopenia, unspecified: Secondary | ICD-10-CM | POA: Diagnosis present

## 2022-03-20 LAB — CBC WITH DIFFERENTIAL/PLATELET
Abs Immature Granulocytes: 0.05 10*3/uL (ref 0.00–0.07)
Basophils Absolute: 0 10*3/uL (ref 0.0–0.1)
Basophils Relative: 1 %
Eosinophils Absolute: 0.1 10*3/uL (ref 0.0–0.5)
Eosinophils Relative: 2 %
HCT: 45.6 % (ref 39.0–52.0)
Hemoglobin: 15.4 g/dL (ref 13.0–17.0)
Immature Granulocytes: 1 %
Lymphocytes Relative: 17 %
Lymphs Abs: 1.2 10*3/uL (ref 0.7–4.0)
MCH: 31.7 pg (ref 26.0–34.0)
MCHC: 33.8 g/dL (ref 30.0–36.0)
MCV: 93.8 fL (ref 80.0–100.0)
Monocytes Absolute: 0.6 10*3/uL (ref 0.1–1.0)
Monocytes Relative: 9 %
Neutro Abs: 4.9 10*3/uL (ref 1.7–7.7)
Neutrophils Relative %: 70 %
Platelets: 143 10*3/uL — ABNORMAL LOW (ref 150–400)
RBC: 4.86 MIL/uL (ref 4.22–5.81)
RDW: 16.1 % — ABNORMAL HIGH (ref 11.5–15.5)
WBC: 7 10*3/uL (ref 4.0–10.5)
nRBC: 0 % (ref 0.0–0.2)

## 2022-03-20 LAB — URINALYSIS, ROUTINE W REFLEX MICROSCOPIC
Bilirubin Urine: NEGATIVE
Glucose, UA: NEGATIVE mg/dL
Hgb urine dipstick: NEGATIVE
Ketones, ur: NEGATIVE mg/dL
Leukocytes,Ua: NEGATIVE
Nitrite: NEGATIVE
Protein, ur: NEGATIVE mg/dL
Specific Gravity, Urine: 1.017 (ref 1.005–1.030)
pH: 6 (ref 5.0–8.0)

## 2022-03-20 LAB — COMPREHENSIVE METABOLIC PANEL
ALT: 29 U/L (ref 0–44)
AST: 39 U/L (ref 15–41)
Albumin: 3.1 g/dL — ABNORMAL LOW (ref 3.5–5.0)
Alkaline Phosphatase: 64 U/L (ref 38–126)
Anion gap: 9 (ref 5–15)
BUN: 41 mg/dL — ABNORMAL HIGH (ref 8–23)
CO2: 25 mmol/L (ref 22–32)
Calcium: 10.1 mg/dL (ref 8.9–10.3)
Chloride: 104 mmol/L (ref 98–111)
Creatinine, Ser: 2.1 mg/dL — ABNORMAL HIGH (ref 0.61–1.24)
GFR, Estimated: 32 mL/min — ABNORMAL LOW (ref >60)
Glucose, Bld: 108 mg/dL — ABNORMAL HIGH (ref 70–99)
Potassium: 3.8 mmol/L (ref 3.5–5.1)
Sodium: 138 mmol/L (ref 135–145)
Total Bilirubin: 1.8 mg/dL — ABNORMAL HIGH (ref 0.3–1.2)
Total Protein: 6.1 g/dL — ABNORMAL LOW (ref 6.5–8.1)

## 2022-03-20 LAB — MAGNESIUM: Magnesium: 1.8 mg/dL (ref 1.7–2.4)

## 2022-03-20 LAB — LACTATE DEHYDROGENASE: LDH: 331 U/L — ABNORMAL HIGH (ref 98–192)

## 2022-03-20 MED ORDER — SODIUM CHLORIDE 0.9 % IV SOLN
INTRAVENOUS | Status: AC
Start: 2022-03-20 — End: 2022-03-21

## 2022-03-20 NOTE — Progress Notes (Signed)
[facility]  PROGRESS NOTE  [name] IFO:[id number] DOB: July 20, 1946 DOA: 03/19/2022 PCP: Cassandria Anger, MD   Brief HPI:   76 year old male with history of B-cell lymphoma, hypertension, BPH, hypothyroidism, paroxysmal atrial fibrillation on Xarelto, chronic diastolic heart failure, CKD stage III a with baseline creatinine 1.5-1.6 presented to hospital as a direct admission from oncology clinic. Patient was having chills at home for past few days.  Also has been having increasing urinary frequency at home.  Complains of occasional dysuria.  He does have a history of BPH. He was seen at oncology clinic with fatigue and failure to thrive. Recent PET scan showed widespread pulmonary involvement of his B-cell lymphoma. Labs were drawn in the clinic, creatinine was 2.17, baseline creatinine 1.5-1.6.   Subjective   Patient feels better this morning.  Denies dysuria.  No abdominal pain   Assessment/Plan:    Acute kidney injury on CKD stage IIIa -Bladder scan showed 107 mL in the bladder. -Started on gentle IV normal saline at 100 mL/h -Creatinine is down to 2.10 this morning -HCTZ and ARB on hold  ?  UTI --Urine culture and blood culture obtained. -Started on empiric cefepime. -Follow culture results  Paroxysmal atrial fibrillation -Stable -Patient started on Cardizem CD2 40 mg daily -Continue Xarelto  BPH -Bladder scan only showed 107 ml -Continue Uroxatrol 10 mg daily  Chronic diastolic heart failure -Euvolemic  Diffuse large B-cell lymphoma -Followed by hematology oncology -Undergoing chemotherapy  COPD -Stable -On room air  Chronic anticoagulation -Continue Xarelto  Hypertension -Blood pressure is stable -Losartan HCTZ on hold due to acute kidney injury -Continue Cardizem, Imdur  Hypothyroidism -Continue Synthroid     Medications     feeding supplement  237 mL Oral BID BM     Data Reviewed:   CBG:  No results for input(s):  "GLUCAP" in the last 168 hours.  SpO2: 94 %    Vitals:   03/20/22 0012 03/20/22 0400 03/20/22 0440 03/20/22 0823  BP: (!) 97/55 112/63  131/82  Pulse: 80 85  77  Resp: '19 20  20  '$ Temp: 98.1 F (36.7 C) 97.6 F (36.4 C)  98.3 F (36.8 C)  TempSrc: Oral Oral    SpO2: 100% 100%  94%  Weight:   112.8 kg   Height:          Data Reviewed:  Basic Metabolic Panel: Recent Labs  Lab 03/19/22 1233 03/20/22 0547  NA 135 138  K 4.1 3.8  CL 99 104  CO2 24 25  GLUCOSE 111* 108*  BUN 38* 41*  CREATININE 2.17* 2.10*  CALCIUM 10.5* 10.1  MG  --  1.8    CBC: Recent Labs  Lab 03/16/22 1130 03/19/22 1233 03/20/22 0547  WBC 6.3 8.4 7.0  NEUTROABS 4.3 5.7 4.9  HGB 16.5 16.7 15.4  HCT 48.7 48.1 45.6  MCV 92.8 91.6 93.8  PLT 163 173 143*    LFT Recent Labs  Lab 03/19/22 1233 03/20/22 0547  AST 41 39  ALT 32 29  ALKPHOS 74 64  BILITOT 1.4* 1.8*  PROT 6.7 6.1*  ALBUMIN 3.7 3.1*     Antibiotics: Anti-infectives (From admission, onward)    Start     Dose/Rate Route Frequency Ordered Stop   03/19/22 2215  ceFEPIme (MAXIPIME) 2 g in sodium chloride 0.9 % 100 mL IVPB        2 g 200 mL/hr over 30 Minutes Intravenous Every 12 hours 03/19/22 2127  DVT prophylaxis: Xarelto  Code Status: Full code  Family Communication: No family at bedside   CONSULTS    Objective    Physical Examination:   General-appears in no acute distress Heart-S1-S2, regular, no murmur auscultated Lungs-clear to auscultation bilaterally, no wheezing or crackles auscultated Abdomen-soft, nontender, no organomegaly Extremities-no edema in the lower extremities Neuro-alert, oriented x3, no focal deficit noted   Status is: Inpatient: Acute kidney injury      [facility] If 7PM-7AM, please contact night-coverage at [email or web address], Office  [phone]   03/20/2022, 3:59 PM  LOS: 1 day

## 2022-03-21 DIAGNOSIS — J449 Chronic obstructive pulmonary disease, unspecified: Secondary | ICD-10-CM | POA: Diagnosis not present

## 2022-03-21 DIAGNOSIS — N179 Acute kidney failure, unspecified: Secondary | ICD-10-CM | POA: Diagnosis not present

## 2022-03-21 DIAGNOSIS — I5032 Chronic diastolic (congestive) heart failure: Secondary | ICD-10-CM | POA: Diagnosis not present

## 2022-03-21 DIAGNOSIS — N401 Enlarged prostate with lower urinary tract symptoms: Secondary | ICD-10-CM | POA: Diagnosis not present

## 2022-03-21 LAB — BASIC METABOLIC PANEL WITH GFR
Anion gap: 9 (ref 5–15)
BUN: 30 mg/dL — ABNORMAL HIGH (ref 8–23)
CO2: 26 mmol/L (ref 22–32)
Calcium: 9.6 mg/dL (ref 8.9–10.3)
Chloride: 103 mmol/L (ref 98–111)
Creatinine, Ser: 1.32 mg/dL — ABNORMAL HIGH (ref 0.61–1.24)
GFR, Estimated: 56 mL/min — ABNORMAL LOW
Glucose, Bld: 101 mg/dL — ABNORMAL HIGH (ref 70–99)
Potassium: 3.7 mmol/L (ref 3.5–5.1)
Sodium: 138 mmol/L (ref 135–145)

## 2022-03-21 LAB — CBC
HCT: 46.6 % (ref 39.0–52.0)
Hemoglobin: 15.6 g/dL (ref 13.0–17.0)
MCH: 31.3 pg (ref 26.0–34.0)
MCHC: 33.5 g/dL (ref 30.0–36.0)
MCV: 93.6 fL (ref 80.0–100.0)
Platelets: 142 K/uL — ABNORMAL LOW (ref 150–400)
RBC: 4.98 MIL/uL (ref 4.22–5.81)
RDW: 16 % — ABNORMAL HIGH (ref 11.5–15.5)
WBC: 6 K/uL (ref 4.0–10.5)
nRBC: 0 % (ref 0.0–0.2)

## 2022-03-21 LAB — URINE CULTURE: Culture: NO GROWTH

## 2022-03-21 MED ORDER — SODIUM CHLORIDE 0.9 % IV SOLN
2.0000 g | Freq: Three times a day (TID) | INTRAVENOUS | Status: DC
  Administered 2022-03-21 – 2022-03-22 (×4): 2 g via INTRAVENOUS
  Filled 2022-03-21 (×5): qty 12.5

## 2022-03-21 NOTE — Evaluation (Signed)
Physical Therapy Evaluation Patient Details Name: [name] MRN: [id number] DOB: [birth year] Today's Date: 03/21/2022  History of Present Illness  76 year old male presented to hospital as a direct admission from oncology clinic, admitted with AKI.  Recent PET scan showed widespread pulmonary involvement of his B-cell lymphoma.  PMH: B-cell lymphoma, hypertension, BPH, hypothyroidism, paroxysmal atrial fibrillation on Xarelto, chronic diastolic heart failure, CKD stage III  Clinical Impression  Pt admitted with above diagnosis.  Pt agreeable to PT, reports mostly mod I at baseline, husband able to assist as needed. Today pt presents with balance deficits and very unsteady gait. Gait stability does improve with distance with pt requiring frequent safety cues. Pt has all DME and has had HH in past, does not feel he will need f/u. Will follow in acute setting to maximize mobility, balance and safety.   Pt currently with functional limitations due to the deficits listed below (see PT Problem List). Pt will benefit from skilled PT to increase their independence and safety with mobility to allow discharge to the venue listed below.          Recommendations for follow up therapy are one component of a multi-disciplinary discharge planning process, led by the attending physician.  Recommendations may be updated based on patient status, additional functional criteria and insurance authorization.  Follow Up Recommendations No PT follow up    Assistance Recommended at Discharge Intermittent Supervision/Assistance  Patient can return home with the following  Assist for transportation;Help with stairs or ramp for entrance;Assistance with cooking/housework    Equipment Recommendations None recommended by PT  Recommendations for Other Services       Functional Status Assessment Patient has had a recent decline in their functional status and demonstrates the ability to make significant improvements  in function in a reasonable and predictable amount of time.     Precautions / Restrictions Precautions Precautions: Fall Restrictions Weight Bearing Restrictions: No      Mobility  Bed Mobility Overal bed mobility: Needs Assistance Bed Mobility: Supine to Sit, Sit to Supine     Supine to sit: Supervision Sit to supine: Supervision   General bed mobility comments: for safety and lines    Transfers Overall transfer level: Needs assistance Equipment used: Rolling walker (2 wheels) Transfers: Sit to/from Stand Sit to Stand: Min assist           General transfer comment: repeated x2 for pad change and repositioning. min assist to rise and steady with transition to RW    Ambulation/Gait Ambulation/Gait assistance: Min assist, Min guard Gait Distance (Feet): 120 Feet Assistive device: Rolling walker (2 wheels) Gait Pattern/deviations: Narrow base of support, Step-through pattern, Decreased stride length Gait velocity: decr     General Gait Details: pt maintains bil LEs in external rotation, decr pf bil with wt shifted to heels and  limited push-off. overall unsteady, improved stability with incr distance; no overt LOB; verbal cues for safety with turns and to keep feet inside RW as well as proximity to [employer]    Modified Rankin (Stroke Patients Only)       Balance Overall balance assessment: Needs assistance Sitting-balance support: Feet supported, No upper extremity supported Sitting balance-Leahy Scale: Good     Standing balance support: Reliant on assistive device for balance, During functional activity, Bilateral upper extremity supported Standing balance-Leahy Scale: Poor  High level balance activites: Side stepping High Level Balance Comments: reliant on UEs             Pertinent Vitals/Pain Pain Assessment Pain Assessment: No/denies pain    Home Living Family/patient expects to be  discharged to:: Private residence Living Arrangements: Spouse/significant other Available Help at Discharge: Available 24 hours/day Type of Home: House Home Access: Stairs to enter Entrance Stairs-Rails: Right Entrance Stairs-Number of Steps: 4 Alternate Level Stairs-Number of Steps: has chair lift Home Layout: Two level Home Equipment: Conservation officer, nature (2 wheels);Cane - single point;Wheelchair - manual      Prior Function               Mobility Comments: amb with RW; reports a fall ~ 1 month ago, reports being unsteady andlosing his balance when he moves too quickly       Hand Dominance        Extremity/Trunk Assessment   Upper Extremity Assessment Upper Extremity Assessment: Defer to OT evaluation    Lower Extremity Assessment Lower Extremity Assessment: Generalized weakness       Communication   Communication: No difficulties  Cognition Arousal/Alertness: Awake/alert Behavior During Therapy: WFL for tasks assessed/performed Overall Cognitive Status: Within Functional Limits for tasks assessed                                          General Comments      Exercises     Assessment/Plan    PT Assessment Patient needs continued PT services  PT Problem List Decreased strength;Decreased mobility;Decreased balance;Decreased coordination       PT Treatment Interventions DME instruction;Therapeutic exercise;Gait training;Functional mobility training;Therapeutic activities;Patient/family education;Balance training    PT Goals (Current goals can be found in the Care Plan section)  Acute Rehab PT Goals Patient Stated Goal: home soon PT Goal Formulation: With patient Time For Goal Achievement: 04/04/22 Potential to Achieve Goals: Good    Frequency Min 3X/week     Co-evaluation               AM-PAC PT "6 Clicks" Mobility  Outcome Measure Help needed turning from your back to your side while in a flat bed without using bedrails?: A  Little Help needed moving from lying on your back to sitting on the side of a flat bed without using bedrails?: A Little Help needed moving to and from a bed to a chair (including a wheelchair)?: A Little Help needed standing up from a chair using your arms (e.g., wheelchair or bedside chair)?: A Little Help needed to walk in hospital room?: A Little Help needed climbing 3-5 steps with a railing? : A Lot 6 Click Score: 17    End of Session Equipment Utilized During Treatment: Gait belt Activity Tolerance: Patient tolerated treatment well Patient left: in bed;with call bell/phone within reach;with bed alarm set   PT Visit Diagnosis: Other abnormalities of gait and mobility (R26.89);Difficulty in walking, not elsewhere classified (R26.2)    Time: 5400-8676 PT Time Calculation (min) (ACUTE ONLY): 19 min   Charges:   PT Evaluation $PT Eval Low Complexity: [place], PT  Acute Rehab Dept ([facility]) [phone] Pager [phone]  03/21/2022   [facility] 03/21/2022, 4:31 PM

## 2022-03-21 NOTE — Progress Notes (Signed)
[facility]  PROGRESS NOTE  [name] SKA:[id number] DOB: 1946-08-14 DOA: 03/19/2022 PCP: Cassandria Anger, MD   Brief HPI:   76 year old male with history of B-cell lymphoma, hypertension, BPH, hypothyroidism, paroxysmal atrial fibrillation on Xarelto, chronic diastolic heart failure, CKD stage III a with baseline creatinine 1.5-1.6 presented to hospital as a direct admission from oncology clinic. Patient was having chills at home for past few days.  Also has been having increasing urinary frequency at home.  Complains of occasional dysuria.  He does have a history of BPH. He was seen at oncology clinic with fatigue and failure to thrive. Recent PET scan showed widespread pulmonary involvement of his B-cell lymphoma. Labs were drawn in the clinic, creatinine was 2.17, baseline creatinine 1.5-1.6.   Subjective   Patient seen and examined, feels better this morning.   Assessment/Plan:    Acute kidney injury on CKD stage IIIa -Bladder scan showed 107 mL in the bladder. -Started on gentle IV normal saline at 100 mL/h -Creatinine is down to 1.32 this morning -HCTZ and ARB on hold  ?  UTI --Urine culture and blood culture obtained. -Started on empiric cefepime. -Follow culture results  Paroxysmal atrial fibrillation -Stable -Patient started on Cardizem CD 240 mg daily -Continue Xarelto  BPH -Bladder scan only showed 107 ml -Continue Uroxatrol 10 mg daily  Chronic diastolic heart failure -Euvolemic  Diffuse large B-cell lymphoma -Followed by hematology oncology -Undergoing chemotherapy  COPD -Stable -On room air  Chronic anticoagulation -Continue Xarelto  Hypertension -Blood pressure is stable -Losartan HCTZ on hold due to acute kidney injury -Continue Cardizem, Imdur  Hypothyroidism -Continue Synthroid     Medications     feeding supplement  237 mL Oral BID BM     Data Reviewed:   CBG:  No results for input(s): "GLUCAP" in the  last 168 hours.  SpO2: 98 %    Vitals:   03/20/22 1619 03/20/22 2050 03/21/22 0500 03/21/22 0616  BP: 140/85 131/77  138/76  Pulse: 88 91  98  Resp: '20 19  20  '$ Temp: (!) 97.4 F (36.3 C) 98.5 F (36.9 C)  98.2 F (36.8 C)  TempSrc: Oral Oral  Oral  SpO2: 94% 97%  98%  Weight:   115.9 kg   Height:          Data Reviewed:  Basic Metabolic Panel: Recent Labs  Lab 03/19/22 1233 03/20/22 0547 03/21/22 0608  NA 135 138 138  K 4.1 3.8 3.7  CL 99 104 103  CO2 '24 25 26  '$ GLUCOSE 111* 108* 101*  BUN 38* 41* 30*  CREATININE 2.17* 2.10* 1.32*  CALCIUM 10.5* 10.1 9.6  MG  --  1.8  --     CBC: Recent Labs  Lab 03/16/22 1130 03/19/22 1233 03/20/22 0547 03/21/22 0608  WBC 6.3 8.4 7.0 6.0  NEUTROABS 4.3 5.7 4.9  --   HGB 16.5 16.7 15.4 15.6  HCT 48.7 48.1 45.6 46.6  MCV 92.8 91.6 93.8 93.6  PLT 163 173 143* 142*    LFT Recent Labs  Lab 03/19/22 1233 03/20/22 0547  AST 41 39  ALT 32 29  ALKPHOS 74 64  BILITOT 1.4* 1.8*  PROT 6.7 6.1*  ALBUMIN 3.7 3.1*     Antibiotics: Anti-infectives (From admission, onward)    Start     Dose/Rate Route Frequency Ordered Stop   03/21/22 1000  ceFEPIme (MAXIPIME) 2 g in sodium chloride 0.9 % 100 mL IVPB  2 g 200 mL/hr over 30 Minutes Intravenous Every 8 hours 03/21/22 0930     03/19/22 2215  ceFEPIme (MAXIPIME) 2 g in sodium chloride 0.9 % 100 mL IVPB  Status:  Discontinued        2 g 200 mL/hr over 30 Minutes Intravenous Every 12 hours 03/19/22 2127 03/21/22 0930        DVT prophylaxis: Xarelto  Code Status: Full code  Family Communication: No family at bedside   CONSULTS    Objective    Physical Examination:   General-appears in no acute distress Heart-S1-S2, regular, no murmur auscultated Lungs-clear to auscultation bilaterally, no wheezing or crackles auscultated Abdomen-soft, nontender, no organomegaly Extremities-no edema in the lower extremities Neuro-alert, oriented x3, no focal  deficit noted   Status is: Inpatient: Acute kidney injury      [facility] If 7PM-7AM, please contact night-coverage at [email or web address], Office  [phone]   03/21/2022, 12:55 PM  LOS: 2 days

## 2022-03-21 NOTE — Progress Notes (Signed)
Pharmacy Antibiotic Note  [name] is a 76 y.o. male admitted on 03/19/2022 with UTI.  Pharmacy has been consulted for Cefepime dosing.  ID: Possible pseudomonas UTI. Afebrile. WBC WNL, Scr down to 1.32  Antimicrobials this admission: 6/9 cefepime >>    Microbiology results: 6/9 BCx: NGTD 6/9 UCx: oneg 5/27 UCx: pseudomonas 5/17 UCx; E. Coli, pan sensitive  Plan: Increase Cefepime 2 g IV q8 (hx pseudomonas UTI)   Height: 6' 3.5" (191.8 cm) Weight: 115.9 kg (255 lb 8.2 oz) IBW/kg (Calculated) : 85.65  Temp (24hrs), Avg:98 F (36.7 C), Min:97.4 F (36.3 C), Max:98.5 F (36.9 C)  Recent Labs  Lab 03/16/22 1130 03/19/22 1233 03/20/22 0547 03/21/22 0608  WBC 6.3 8.4 7.0 6.0  CREATININE  --  2.17* 2.10* 1.32*    Estimated Creatinine Clearance: 65.9 mL/min (A) (by C-G formula based on SCr of 1.32 mg/dL (H)).    Allergies  Allergen Reactions   Short Ragweed Pollen Ext Cough    Yolonda Purtle S. Alford Highland, PharmD, BCPS Clinical Staff Pharmacist [email or web address]  Wayland [place] 03/21/2022 9:31 AM

## 2022-03-22 ENCOUNTER — Inpatient Hospital Stay (HOSPITAL_COMMUNITY): Payer: Medicare PPO

## 2022-03-22 ENCOUNTER — Encounter (HOSPITAL_COMMUNITY): Payer: Self-pay | Admitting: Family Medicine

## 2022-03-22 DIAGNOSIS — N401 Enlarged prostate with lower urinary tract symptoms: Secondary | ICD-10-CM | POA: Diagnosis not present

## 2022-03-22 DIAGNOSIS — N179 Acute kidney failure, unspecified: Secondary | ICD-10-CM | POA: Diagnosis not present

## 2022-03-22 DIAGNOSIS — J449 Chronic obstructive pulmonary disease, unspecified: Secondary | ICD-10-CM | POA: Diagnosis not present

## 2022-03-22 DIAGNOSIS — I5032 Chronic diastolic (congestive) heart failure: Secondary | ICD-10-CM | POA: Diagnosis not present

## 2022-03-22 DIAGNOSIS — Z5111 Encounter for antineoplastic chemotherapy: Secondary | ICD-10-CM

## 2022-03-22 DIAGNOSIS — R6251 Failure to thrive (child): Secondary | ICD-10-CM

## 2022-03-22 DIAGNOSIS — C8330 Diffuse large B-cell lymphoma, unspecified site: Secondary | ICD-10-CM | POA: Diagnosis not present

## 2022-03-22 LAB — BASIC METABOLIC PANEL
Anion gap: 8 (ref 5–15)
BUN: 25 mg/dL — ABNORMAL HIGH (ref 8–23)
CO2: 24 mmol/L (ref 22–32)
Calcium: 9.1 mg/dL (ref 8.9–10.3)
Chloride: 106 mmol/L (ref 98–111)
Creatinine, Ser: 1.21 mg/dL (ref 0.61–1.24)
GFR, Estimated: >60 mL/min (ref >60)
Glucose, Bld: 107 mg/dL — ABNORMAL HIGH (ref 70–99)
Potassium: 3.5 mmol/L (ref 3.5–5.1)
Sodium: 138 mmol/L (ref 135–145)

## 2022-03-22 MED ORDER — RIVAROXABAN 10 MG PO TABS: 10.0000 mg | ORAL_TABLET | Freq: Every day | ORAL | Status: DC

## 2022-03-22 MED ORDER — ACETAMINOPHEN 325 MG PO TABS
650.0000 mg | ORAL_TABLET | Freq: Once | ORAL | Status: AC
  Administered 2022-03-22: 650 mg via ORAL
  Filled 2022-03-22: qty 2

## 2022-03-22 MED ORDER — SODIUM CHLORIDE 0.9 % IV SOLN
150.0000 mg | Freq: Once | INTRAVENOUS | Status: AC
  Administered 2022-03-22: 150 mg via INTRAVENOUS
  Filled 2022-03-22: qty 5

## 2022-03-22 MED ORDER — DARIFENACIN HYDROBROMIDE ER 15 MG PO TB24
15.0000 mg | ORAL_TABLET | Freq: Every day | ORAL | Status: DC
  Administered 2022-03-22 – 2022-03-26 (×5): 15 mg via ORAL
  Filled 2022-03-22 (×5): qty 1

## 2022-03-22 MED ORDER — SODIUM CHLORIDE 0.9 % IV SOLN: Freq: Once | INTRAVENOUS | Status: DC

## 2022-03-22 MED ORDER — SODIUM CHLORIDE 0.9 % IV SOLN
435.6000 mg | Freq: Once | INTRAVENOUS | Status: AC
  Administered 2022-03-22: 440 mg via INTRAVENOUS
  Filled 2022-03-22: qty 44

## 2022-03-22 MED ORDER — LEVOTHYROXINE SODIUM 100 MCG PO TABS
100.0000 ug | ORAL_TABLET | Freq: Every day | ORAL | Status: DC
  Administered 2022-03-23 – 2022-03-26 (×4): 100 ug via ORAL
  Filled 2022-03-22 (×4): qty 1

## 2022-03-22 MED ORDER — BUPROPION HCL ER (XL) 300 MG PO TB24
300.0000 mg | ORAL_TABLET | Freq: Every day | ORAL | Status: DC
  Administered 2022-03-22 – 2022-03-26 (×5): 300 mg via ORAL
  Filled 2022-03-22 (×5): qty 1

## 2022-03-22 MED ORDER — SODIUM CHLORIDE 0.9 % IV SOLN
800.0000 mg/m2 | Freq: Once | INTRAVENOUS | Status: AC
  Administered 2022-03-22: 1976 mg via INTRAVENOUS
  Filled 2022-03-22: qty 51.97

## 2022-03-22 MED ORDER — DIPHENHYDRAMINE HCL 25 MG PO CAPS
50.0000 mg | ORAL_CAPSULE | Freq: Once | ORAL | Status: AC
  Administered 2022-03-22: 50 mg via ORAL
  Filled 2022-03-22: qty 2

## 2022-03-22 MED ORDER — CLONAZEPAM 1 MG PO TABS: 1.0000 mg | ORAL_TABLET | Freq: Four times a day (QID) | ORAL | Status: DC | PRN

## 2022-03-22 MED ORDER — SODIUM CHLORIDE 0.9 % IV SOLN
375.0000 mg/m2 | Freq: Once | INTRAVENOUS | Status: AC
  Administered 2022-03-22: 900 mg via INTRAVENOUS
  Filled 2022-03-22: qty 90

## 2022-03-22 MED ORDER — ALFUZOSIN HCL ER 10 MG PO TB24
10.0000 mg | ORAL_TABLET | Freq: Every day | ORAL | Status: DC
  Administered 2022-03-22 – 2022-03-25 (×4): 10 mg via ORAL
  Filled 2022-03-22 (×4): qty 1

## 2022-03-22 MED ORDER — CHLORHEXIDINE GLUCONATE CLOTH 2 % EX PADS
6.0000 | MEDICATED_PAD | Freq: Every day | CUTANEOUS | Status: DC
  Administered 2022-03-23 – 2022-03-26 (×4): 6 via TOPICAL

## 2022-03-22 MED ORDER — SODIUM CHLORIDE 0.9% FLUSH
10.0000 mL | INTRAVENOUS | Status: DC | PRN
  Administered 2022-03-26: 10 mL

## 2022-03-22 MED ORDER — ATORVASTATIN CALCIUM 40 MG PO TABS
80.0000 mg | ORAL_TABLET | Freq: Every day | ORAL | Status: DC
  Administered 2022-03-22 – 2022-03-26 (×5): 80 mg via ORAL
  Filled 2022-03-22 (×5): qty 2

## 2022-03-22 MED ORDER — SODIUM CHLORIDE 0.9 % IV SOLN: Freq: Once | INTRAVENOUS | Status: AC

## 2022-03-22 MED ORDER — SODIUM CHLORIDE 0.9 % IV SOLN
10.0000 mg | Freq: Once | INTRAVENOUS | Status: AC
  Administered 2022-03-22: 10 mg via INTRAVENOUS
  Filled 2022-03-22: qty 1

## 2022-03-22 MED ORDER — ISOSORBIDE MONONITRATE ER 30 MG PO TB24
30.0000 mg | ORAL_TABLET | Freq: Every day | ORAL | Status: DC
  Administered 2022-03-22 – 2022-03-26 (×5): 30 mg via ORAL
  Filled 2022-03-22 (×5): qty 1

## 2022-03-22 MED ORDER — RIVAROXABAN 10 MG PO TABS
10.0000 mg | ORAL_TABLET | Freq: Every day | ORAL | Status: DC
  Administered 2022-03-22 – 2022-03-26 (×5): 10 mg via ORAL
  Filled 2022-03-22 (×5): qty 1

## 2022-03-22 MED ORDER — DILTIAZEM HCL ER COATED BEADS 240 MG PO CP24
240.0000 mg | ORAL_CAPSULE | Freq: Every day | ORAL | Status: DC
  Administered 2022-03-22 – 2022-03-26 (×5): 240 mg via ORAL
  Filled 2022-03-22 (×5): qty 1

## 2022-03-22 MED ORDER — PALONOSETRON HCL INJECTION 0.25 MG/5ML
0.2500 mg | Freq: Once | INTRAVENOUS | Status: AC
  Administered 2022-03-22: 0.25 mg via INTRAVENOUS
  Filled 2022-03-22: qty 5

## 2022-03-22 NOTE — Progress Notes (Signed)
Ok to tx with bili=1.8 per Dr Irene Limbo

## 2022-03-22 NOTE — Progress Notes (Signed)
Xarelto was held at the time of admission due to recurrent falls at home.  Discussed with patient, he fell about 5 times in past 24 hours before coming to the hospital.  Denies hitting head or loss of consciousness.  Will obtain CT head without contrast before resuming Xarelto today.

## 2022-03-22 NOTE — Progress Notes (Signed)
  Transition of Care [facility]) Screening Note   Patient Details  Name: NEGAN GRUDZIEN Date of Birth: 16-Jun-1946   Transition of Care [facility]) CM/SW Contact:    Dessa Phi, RN Phone Number: 03/22/2022, 10:07 AM    Transition of Care Department [facility]) has reviewed patient and no TOC needs have been identified at this time. We will continue to monitor patient advancement through interdisciplinary progression rounds. If new patient transition needs arise, please place a TOC consult.

## 2022-03-22 NOTE — Progress Notes (Signed)
Patient received Chemotherapy/ Immunotherapy. Patient tolerated treatment well with no issues. Vital signs stable through treatment and after. Floor Nurse was notified after treatment was completed.

## 2022-03-22 NOTE — Progress Notes (Signed)
Initial Nutrition Assessment  DOCUMENTATION CODES:   Not applicable  INTERVENTION:  - continue Ensure Plus High Protein BID, each supplement provides 350 kcal and 20 grams of protein.   NUTRITION DIAGNOSIS:   Increased nutrient needs related to acute illness, chronic illness, cancer and cancer related treatments as evidenced by estimated needs.  GOAL:   Patient will meet greater than or equal to 90% of their needs  MONITOR:   PO intake, Supplement acceptance, Labs, Weight trends  REASON FOR ASSESSMENT:   Malnutrition Screening Tool  ASSESSMENT:   76 year old male with history of B-cell lymphoma on chemo, HTN, BPH, hypothyroidism, afib on Xarelto, CHF, stage 3 CKD, HLD, OSA on CPAP, GERD, osteoarthritis, DJD, and iron deficiency anemia. He presented to the ED due to having chills for several days, increased urinary frequency, and occasional dysuria. At the [facility] he was noted to be fatigued and have failure to thrive. Recent PET scan showed widespread pulmonary involvement.  Per flow sheet documentation, patient ate 25% of breakfast and 75% of lunch yesterday and 100% of breakfast this AM.  Patient seen by this RD on 5/29.  Patient laying in bed with RN from infusion at bedside as patient was receiving chemo. Ordered lunch after visit per patient request.   He recounts how he was previously interested in losing weight and had met with a doctor about this and was encouraged to eat once/day or even skip days of eating. He was doing this for an unknown amount of time and losing 5-6 lb/week. He states that over the course of several months he had lost 60 lb.   He shares that since dx of lymphoma healthcare professionals have been encouraging him to eat and to not lose weight and that he is agreeable with this.  He denies pain to L hip unless he applies direct pressure to the area. He uses a walker at home. He denies any pain or difficulties with chewing or swallowing.    Patient is ordered Ensure BID and has accepted it each time offered. He is agreeable to continuing to receive supplement.  Weight today is 252 lb and weight on 02/25/22 was 261 lb. This indicates 9 lb weight loss (3.4% body weight).   Labs reviewed; BUN: 25 mg/dl. Medications reviewed; 100 mcg oral synthroid/day.    NUTRITION - FOCUSED PHYSICAL EXAM:  Flowsheet Row Most Recent Value  Orbital Region No depletion  Upper Arm Region No depletion  Thoracic and Lumbar Region Unable to assess  Buccal Region Mild depletion  Temple Region No depletion  Clavicle Bone Region Mild depletion  Clavicle and Acromion Bone Region Mild depletion  Scapular Bone Region No depletion  Dorsal Hand No depletion  Patellar Region No depletion  Anterior Thigh Region No depletion  Posterior Calf Region No depletion  Edema (RD Assessment) Mild  [BLE]  Hair Reviewed  Eyes Reviewed  Mouth Reviewed  Skin Reviewed  Nails Reviewed       Diet Order:   Diet Order             Diet regular Room service appropriate? Yes; Fluid consistency: Thin  Diet effective now                   EDUCATION NEEDS:   Education needs have been addressed  Skin:  Skin Integrity Issues:: Other (Comment) Other: full thickness lesion/tumor to L hip d/t lymphoma mass  Last BM:  PTA/unknown  Height:   Ht Readings from Last 1 Encounters:  03/19/22 6' 3.5" (1.918 m)    Weight:   Wt Readings from Last 1 Encounters:  03/22/22 114.3 kg     BMI:  Body mass index is 31.07 kg/m.  Estimated Nutritional Needs:  Kcal:  2350-2600 kcal Protein:  120-130 grams Fluid:  >/= 2.5 L/day     Jarome Matin, MS, RD, LDN Registered Dietitian II Inpatient Clinical Nutrition RD pager # and on-call/weekend pager # available in [facility]

## 2022-03-22 NOTE — Progress Notes (Signed)
[facility]  PROGRESS NOTE  [name] NKN:[id number] DOB: [birth year] DOA: 03/19/2022 PCP: Cassandria Anger, MD   Brief HPI:   76 year old male with history of B-cell lymphoma, hypertension, BPH, hypothyroidism, paroxysmal atrial fibrillation on Xarelto, chronic diastolic heart failure, CKD stage III a with baseline creatinine 1.5-1.6 presented to hospital as a direct admission from oncology clinic. Patient was having chills at home for past few days.  Also has been having increasing urinary frequency at home.  Complains of occasional dysuria.  He does have a history of BPH. He was seen at oncology clinic with fatigue and failure to thrive. Recent PET scan showed widespread pulmonary involvement of his B-cell lymphoma. Labs were drawn in the clinic, creatinine was 2.17, baseline creatinine 1.5-1.6.   Subjective   Patient seen and examined, denies any complaints.    Assessment/Plan:    Acute kidney injury on CKD stage IIIa -Bladder scan showed 107 mL in the bladder. -Started on gentle IV normal saline at 100 mL/h -Creatinine is down to 1.21  this morning -HCTZ and ARB on hold  Multiple falls at home -As per patient husband at bedside, patient had about 5 falls at home and 24 hours prior to coming to the ED -Patient denies hitting his head or losing consciousness -He was on Xarelto which was on hold at the time of admission -CT head obtained today was negative for any acute bleed -We will restart Xarelto today  History of B-cell lymphoma -Started on chemotherapy per oncology  ?  UTI --Urine culture and blood culture obtained. -Started on empiric cefepime. -Urine culture is negative -We will discontinue cefepime  Paroxysmal atrial fibrillation -Stable -Patient was taking Cardizem CD 240 mg daily at home  -We will restart Cardizem -Continue Xarelto as above  BPH -Bladder scan only showed 107 ml -Continue Uroxatrol 10 mg daily  Chronic diastolic heart  failure -Euvolemic  Diffuse large B-cell lymphoma -Followed by hematology oncology -Undergoing chemotherapy  COPD -Stable -On room air    Hypertension -Blood pressure is stable -Losartan HCTZ on hold due to acute kidney injury -Continue Cardizem, Imdur  Hypothyroidism -Continue Synthroid     Medications     alfuzosin  10 mg Oral QHS   atorvastatin  80 mg Oral Daily   buPROPion  300 mg Oral Daily   CARBOplatin  440 mg Intravenous Once   Chlorhexidine Gluconate Cloth  6 each Topical Daily   darifenacin  15 mg Oral Daily   diltiazem  240 mg Oral Daily   feeding supplement  237 mL Oral BID BM   gemcitabine  800 mg/m2 (Treatment Plan Recorded) Intravenous Once   isosorbide mononitrate  30 mg Oral Daily   [START ON 03/23/2022] levothyroxine  100 mcg Oral QAC breakfast   rivaroxaban  10 mg Oral Daily     Data Reviewed:   CBG:  No results for input(s): "GLUCAP" in the last 168 hours.  SpO2: 98 %    Vitals:   03/22/22 1200 03/22/22 1308 03/22/22 1339 03/22/22 1409  BP: 126/80 138/72 (!) 148/78 115/61  Pulse: (!) 101 68 61 74  Resp: '18 18 18 18  '$ Temp: 98 F (36.7 C) 98.2 F (36.8 C) 98.2 F (36.8 C) 98.2 F (36.8 C)  TempSrc: Oral Oral Oral Oral  SpO2: 98% 98% 98% 98%  Weight:      Height:          Data Reviewed:  Basic Metabolic Panel: Recent Labs  Lab 03/19/22 1233 03/20/22  4481 03/21/22 0608 03/22/22 0502  NA 135 138 138 138  K 4.1 3.8 3.7 3.5  CL 99 104 103 106  CO2 '24 25 26 24  '$ GLUCOSE 111* 108* 101* 107*  BUN 38* 41* 30* 25*  CREATININE 2.17* 2.10* 1.32* 1.21  CALCIUM 10.5* 10.1 9.6 9.1  MG  --  1.8  --   --     CBC: Recent Labs  Lab 03/16/22 1130 03/19/22 1233 03/20/22 0547 03/21/22 0608  WBC 6.3 8.4 7.0 6.0  NEUTROABS 4.3 5.7 4.9  --   HGB 16.5 16.7 15.4 15.6  HCT 48.7 48.1 45.6 46.6  MCV 92.8 91.6 93.8 93.6  PLT 163 173 143* 142*    LFT Recent Labs  Lab 03/19/22 1233 03/20/22 0547  AST 41 39  ALT 32 29   ALKPHOS 74 64  BILITOT 1.4* 1.8*  PROT 6.7 6.1*  ALBUMIN 3.7 3.1*     Antibiotics: Anti-infectives (From admission, onward)    Start     Dose/Rate Route Frequency Ordered Stop   03/21/22 1000  ceFEPIme (MAXIPIME) 2 g in sodium chloride 0.9 % 100 mL IVPB        2 g 200 mL/hr over 30 Minutes Intravenous Every 8 hours 03/21/22 0930     03/19/22 2215  ceFEPIme (MAXIPIME) 2 g in sodium chloride 0.9 % 100 mL IVPB  Status:  Discontinued        2 g 200 mL/hr over 30 Minutes Intravenous Every 12 hours 03/19/22 2127 03/21/22 0930        DVT prophylaxis: Xarelto  Code Status: Full code  Family Communication: Patient's husband at bedside   CONSULTS    Objective    Physical Examination:   General-appears in no acute distress Heart-S1-S2, regular, no murmur auscultated Lungs-clear to auscultation bilaterally, no wheezing or crackles auscultated Abdomen-soft, nontender, no organomegaly Extremities-no edema in the lower extremities Neuro-alert, oriented x3, no focal deficit noted   Status is: Inpatient: Acute kidney injury      [place]   [facility] If 7PM-7AM, please contact night-coverage at [email or web address], Office  [phone]   03/22/2022, 3:09 PM  LOS: 2 days

## 2022-03-23 DIAGNOSIS — I130 Hypertensive heart and chronic kidney disease with heart failure and stage 1 through stage 4 chronic kidney disease, or unspecified chronic kidney disease: Secondary | ICD-10-CM

## 2022-03-23 DIAGNOSIS — R627 Adult failure to thrive: Secondary | ICD-10-CM

## 2022-03-23 DIAGNOSIS — R296 Repeated falls: Secondary | ICD-10-CM

## 2022-03-23 DIAGNOSIS — Z5111 Encounter for antineoplastic chemotherapy: Secondary | ICD-10-CM | POA: Diagnosis not present

## 2022-03-23 DIAGNOSIS — N400 Enlarged prostate without lower urinary tract symptoms: Secondary | ICD-10-CM

## 2022-03-23 DIAGNOSIS — N179 Acute kidney failure, unspecified: Secondary | ICD-10-CM | POA: Diagnosis not present

## 2022-03-23 DIAGNOSIS — C8339 Diffuse large B-cell lymphoma, extranodal and solid organ sites: Secondary | ICD-10-CM

## 2022-03-23 DIAGNOSIS — R918 Other nonspecific abnormal finding of lung field: Secondary | ICD-10-CM

## 2022-03-23 DIAGNOSIS — R591 Generalized enlarged lymph nodes: Secondary | ICD-10-CM

## 2022-03-23 DIAGNOSIS — R4182 Altered mental status, unspecified: Secondary | ICD-10-CM

## 2022-03-23 DIAGNOSIS — N390 Urinary tract infection, site not specified: Secondary | ICD-10-CM

## 2022-03-23 DIAGNOSIS — C8330 Diffuse large B-cell lymphoma, unspecified site: Secondary | ICD-10-CM | POA: Diagnosis not present

## 2022-03-23 DIAGNOSIS — N189 Chronic kidney disease, unspecified: Secondary | ICD-10-CM

## 2022-03-23 MED ORDER — DEXAMETHASONE 4 MG PO TABS
20.0000 mg | ORAL_TABLET | Freq: Every day | ORAL | Status: AC
  Administered 2022-03-23 – 2022-03-25 (×3): 20 mg via ORAL
  Filled 2022-03-23 (×3): qty 5

## 2022-03-23 NOTE — Progress Notes (Signed)
Physical Therapy Treatment Patient Details Name: [name] MRN: [id number] DOB: 11-11-1945 Today's Date: 03/23/2022   History of Present Illness 76 year old male presented to hospital as a direct admission from oncology clinic, admitted with AKI.  Recent PET scan showed widespread pulmonary involvement of his B-cell lymphoma.  PMH: B-cell lymphoma, hypertension, BPH, hypothyroidism, paroxysmal atrial fibrillation on Xarelto, chronic diastolic heart failure, CKD stage III    PT Comments    General Comments: AxO x 3 very pleasant and willing but feeling really weak.  Pt in bed with Spouse at bed side.  Assisted OOB to amb was difficult.  General bed mobility comments: pt required increased assist this session due to increased c/o weakness/fatigue.  General transfer comment: required increased assist this session due to increased c/o weakness/fatigue from CHEMO Tx. General Gait Details: VERY unsteady gait with decreased amb distance due to increased c/o fatigue/weakness most likely from [facility]  Pt present with ataxia, unsteady short shuffled steps.  75% VC's on proper walker to self distance as pt tends to push too far front.  "my knees give out", pt present with B knee buckle as during sit to stand and stand to sit.  Increased tremors throughout with fatigue.  VERY unsteady with turns with near LOB as pt incorrectly advanced walker to side.  HIGH FALL RISK.  Recliner following for safety. Will consult and update LPT pt's decline in mobility.  Pt will need ST Rehab at SNF prior to safely returning home.    Recommendations for follow up therapy are one component of a multi-disciplinary discharge planning process, led by the attending physician.  Recommendations may be updated based on patient status, additional functional criteria and insurance authorization.  Follow Up Recommendations  Skilled nursing-short term rehab (<3 hours/day)     Assistance Recommended at Discharge Intermittent  Supervision/Assistance  Patient can return home with the following Assist for transportation;Help with stairs or ramp for entrance;Assistance with cooking/housework   Equipment Recommendations  None recommended by PT    Recommendations for Other Services       Precautions / Restrictions Precautions Precautions: Fall Restrictions Weight Bearing Restrictions: No     Mobility  Bed Mobility Overal bed mobility: Needs Assistance Bed Mobility: Supine to Sit, Sit to Supine     Supine to sit: Mod assist Sit to supine: Mod assist   General bed mobility comments: pt required increased assist this session due to increased c/o weakness/fatigue    Transfers Overall transfer level: Needs assistance Equipment used: Rolling walker (2 wheels) Transfers: Sit to/from Stand Sit to Stand: Mod assist           General transfer comment: required increased assist this session due to increased c/o weakness/fatigue from CHEMO Tx.    Ambulation/Gait Ambulation/Gait assistance: Mod assist, Max assist Gait Distance (Feet): 18 Feet Assistive device: Rolling walker (2 wheels) Gait Pattern/deviations: Narrow base of support, Step-through pattern, Decreased stride length Gait velocity: decreased     General Gait Details: VERY unsteady gait with decreased amb distance due to increased c/o fatigue/weakness most likely from CHEMO.  Pt present with ataxia, unsteady short shuffled steps.  75% VC's on proper walker to self distance as pt tends to push too far front.  "my knees give out", pt present with B knee buckle as during sit to stand and stand to sit.  Increased tremors throughout with fatigue.  VERY unsteady with turns with near LOB as pt incorrectly advanced walker to side.  HIGH FALL RISK.  Recliner following  for safety.   Stairs             Wheelchair Mobility    Modified Rankin (Stroke Patients Only)       Balance                                             Cognition Arousal/Alertness: Awake/alert Behavior During Therapy: WFL for tasks assessed/performed Overall Cognitive Status: Within Functional Limits for tasks assessed                                 General Comments: AxO x 3 very pleasant and willing but feeling really weak        Exercises      General Comments        Pertinent Vitals/Pain Pain Assessment Pain Assessment: No/denies pain    Home Living                          Prior Function            PT Goals (current goals can now be found in the care plan section) Progress towards PT goals: Progressing toward goals    Frequency    Min 3X/week      PT Plan Current plan remains appropriate    Co-evaluation              AM-PAC PT "6 Clicks" Mobility   Outcome Measure  Help needed turning from your back to your side while in a flat bed without using bedrails?: A Lot Help needed moving from lying on your back to sitting on the side of a flat bed without using bedrails?: A Lot Help needed moving to and from a bed to a chair (including a wheelchair)?: A Lot Help needed standing up from a chair using your arms (e.g., wheelchair or bedside chair)?: A Lot Help needed to walk in hospital room?: A Lot Help needed climbing 3-5 steps with a railing? : Total 6 Click Score: 11    End of Session Equipment Utilized During Treatment: Gait belt Activity Tolerance: Patient tolerated treatment well Patient left: in bed;with call bell/phone within reach;with bed alarm set;with family/visitor present Nurse Communication: Mobility status PT Visit Diagnosis: Other abnormalities of gait and mobility (R26.89);Difficulty in walking, not elsewhere classified (R26.2)     Time: 3154-0086 PT Time Calculation (min) (ACUTE ONLY): 18 min  Charges:  $Gait Training: 8-22 mins                     {Taquilla Downum  PTA Acute  Rehabilitation [employer]      [phone] Office       [phone]

## 2022-03-23 NOTE — Progress Notes (Signed)
[facility]  PROGRESS NOTE  [name] GGY:[id number] DOB: 10/28/1945 DOA: 03/19/2022 PCP: Cassandria Anger, MD   Brief HPI:   76 year old male with history of B-cell lymphoma, hypertension, BPH, hypothyroidism, paroxysmal atrial fibrillation on Xarelto, chronic diastolic heart failure, CKD stage III a with baseline creatinine 1.5-1.6 presented to hospital as a direct admission from oncology clinic. Patient was having chills at home for past few days.  Also has been having increasing urinary frequency at home.  Complains of occasional dysuria.  He does have a history of BPH. He was seen at oncology clinic with fatigue and failure to thrive. Recent PET scan showed widespread pulmonary involvement of his B-cell lymphoma. Labs were drawn in the clinic, creatinine was 2.17, baseline creatinine 1.5-1.6.   Subjective   Patient seen and examined, he is more alert and appears to have more energy while communicating.  S/p first cycle of chemotherapy for large B-cell lymphoma on 03/22/2022.   Assessment/Plan:    Acute kidney injury on CKD stage IIIa -Bladder scan showed 107 mL in the bladder. -Started on gentle IV normal saline at 100 mL/h -Creatinine is down to 1.21  this morning -HCTZ and ARB on hold  Multiple falls at home -As per patient husband at bedside, patient had about 5 falls at home and 24 hours prior to coming to the ED -Patient denies hitting his head or losing consciousness -He was on Xarelto which was on hold at the time of admission -CT head obtained today was negative for any acute bleed -Xarelto was restarted on 03/22/2022  History of B-cell lymphoma -Started on chemotherapy per oncology  ?  UTI --Urine culture and blood culture obtained. -Started on empiric cefepime. -Urine culture is negative -Cefepime has been discontinued  Paroxysmal atrial fibrillation -Stable -Patient was taking Cardizem CD 240 mg daily at home  -We will restart  Cardizem -Continue Xarelto as above  BPH -Bladder scan only showed 107 ml -Continue Uroxatrol 10 mg daily  Chronic diastolic heart failure -Euvolemic  Diffuse large B-cell lymphoma -Followed by hematology oncology -Undergoing chemotherapy  COPD -Stable -On room air    Hypertension -Blood pressure is stable -Losartan HCTZ on hold due to acute kidney injury -Continue Cardizem, Imdur  Hypothyroidism -Continue Synthroid     Medications     alfuzosin  10 mg Oral QHS   atorvastatin  80 mg Oral Daily   buPROPion  300 mg Oral Daily   Chlorhexidine Gluconate Cloth  6 each Topical Daily   darifenacin  15 mg Oral Daily   dexamethasone  20 mg Oral Daily   diltiazem  240 mg Oral Daily   feeding supplement  237 mL Oral BID BM   isosorbide mononitrate  30 mg Oral Daily   levothyroxine  100 mcg Oral QAC breakfast   rivaroxaban  10 mg Oral Daily     Data Reviewed:   CBG:  No results for input(s): "GLUCAP" in the last 168 hours.  SpO2: 94 %    Vitals:   03/22/22 2207 03/23/22 0500 03/23/22 0602 03/23/22 1220  BP: 115/69  124/70 122/67  Pulse: 60  (!) 110 (!) 110  Resp: '20  20 16  '$ Temp: 98.8 F (37.1 C)  98.4 F (36.9 C) 97.9 F (36.6 C)  TempSrc: Oral  Oral Oral  SpO2: 95%  97% 94%  Weight:  114.2 kg    Height:          Data Reviewed:  Basic Metabolic Panel: Recent Labs  Lab 03/19/22 1233 03/20/22 0547 03/21/22 0608 03/22/22 0502  NA 135 138 138 138  K 4.1 3.8 3.7 3.5  CL 99 104 103 106  CO2 '24 25 26 24  '$ GLUCOSE 111* 108* 101* 107*  BUN 38* 41* 30* 25*  CREATININE 2.17* 2.10* 1.32* 1.21  CALCIUM 10.5* 10.1 9.6 9.1  MG  --  1.8  --   --     CBC: Recent Labs  Lab 03/19/22 1233 03/20/22 0547 03/21/22 0608  WBC 8.4 7.0 6.0  NEUTROABS 5.7 4.9  --   HGB 16.7 15.4 15.6  HCT 48.1 45.6 46.6  MCV 91.6 93.8 93.6  PLT 173 143* 142*    LFT Recent Labs  Lab 03/19/22 1233 03/20/22 0547  AST 41 39  ALT 32 29  ALKPHOS 74 64  BILITOT 1.4*  1.8*  PROT 6.7 6.1*  ALBUMIN 3.7 3.1*     Antibiotics: Anti-infectives (From admission, onward)    Start     Dose/Rate Route Frequency Ordered Stop   03/21/22 1000  ceFEPIme (MAXIPIME) 2 g in sodium chloride 0.9 % 100 mL IVPB  Status:  Discontinued        2 g 200 mL/hr over 30 Minutes Intravenous Every 8 hours 03/21/22 0930 03/22/22 1512   03/19/22 2215  ceFEPIme (MAXIPIME) 2 g in sodium chloride 0.9 % 100 mL IVPB  Status:  Discontinued        2 g 200 mL/hr over 30 Minutes Intravenous Every 12 hours 03/19/22 2127 03/21/22 0930        DVT prophylaxis: Xarelto  Code Status: Full code  Family Communication: Patient's husband at bedside   CONSULTS    Objective    Physical Examination:   General-appears in no acute distress Heart-S1-S2, regular, no murmur auscultated Lungs-clear to auscultation bilaterally, no wheezing or crackles auscultated Abdomen-soft, nontender, no organomegaly Extremities-no edema in the lower extremities Neuro-alert, oriented x3, no focal deficit noted   Status is: Inpatient: Acute kidney injury      [place]   [facility] If 7PM-7AM, please contact night-coverage at [email or web address], Office  [phone]   03/23/2022, 5:47 PM  LOS: 3 days

## 2022-03-23 NOTE — Care Management Important Message (Signed)
Important Message  Patient Details IM Letter placed in Patients room Name: [name] MRN: [id number] Date of Birth: 03/17/1946   Medicare Important Message Given:  Yes     Kerin Salen 03/23/2022, 10:49 AM

## 2022-03-23 NOTE — Progress Notes (Signed)
[name]  HEMATOLOGY/ONCOLOGY INPATIENT PROGRESS NOTE  Date of Service: 03/22/2022  Inpatient Attending: .Oswald Hillock, MD   SUBJECTIVE  Patient was seen in oncologic follow-up for management of his diffuse large B-cell lymphoma. His creatinine has improved after hydration and he looks more awake and alert. He was evaluated by physical therapy.  His husband still feels he is likely would not be able to function at home and does have to climb several stairs to get inside the house.  Also the bedroom is on the first floor and he was not able to sit on there chair lift.  We discussed in detail and the patient and his husband would like to proceed with R-GCD second-line chemotherapy for his relapsed large B-cell lymphoma. No dysuria.  No fevers.  OBJECTIVE:  NAD  PHYSICAL EXAMINATION: . Vitals:   03/22/22 1619 03/22/22 2207 03/23/22 0500 03/23/22 0602  BP: 108/62 115/69  124/70  Pulse: (!) 107 60  (!) 110  Resp: '16 20  20  '$ Temp: 98.7 F (37.1 C) 98.8 F (37.1 C)  98.4 F (36.9 C)  TempSrc: Oral Oral  Oral  SpO2: 94% 95%  97%  Weight:   251 lb 12.3 oz (114.2 kg)   Height:       Filed Weights   03/21/22 0500 03/22/22 0500 03/23/22 0500  Weight: 255 lb 8.2 oz (115.9 kg) 251 lb 14.4 oz (114.3 kg) 251 lb 12.3 oz (114.2 kg)   .Body mass index is 31.05 kg/m.  GENERAL:alert, in no acute distress and comfortable SKIN: skin color, texture, turgor are normal, no rashes or significant lesions EYES: normal, conjunctiva are pink and non-injected, sclera clear OROPHARYNX:no exudate, no erythema and lips, buccal mucosa, and tongue normal  NECK: supple, no JVD, thyroid normal size, non-tender, without nodularity LYMPH:  no palpable lymphadenopathy in the cervical, axillary or inguinal LUNGS: clear to auscultation with normal respiratory effort HEART: regular rate & rhythm,  no murmurs and no lower extremity edema ABDOMEN: abdomen soft, non-tender, normoactive bowel sounds  Musculoskeletal:  no cyanosis of digits and no clubbing  PSYCH: alert & oriented x 3 with fluent speech, intermittent delayed recall. NEURO: no focal motor/sensory deficits  MEDICAL HISTORY:  Past Medical History:  Diagnosis Date   Anticoagulant long-term use    xarelto--- takes for AFib,  managed by pcp, Dr Alain Marion   Benign localized prostatic hyperplasia with lower urinary tract symptoms (LUTS)    CAD (coronary artery disease) cardiologist--  dr t. Oval Linsey   hx of known CAD obstruction w/ collaterals (cath done @ [facility] 12-31-2011) ;   last cardiac cath 08-13-2019  showed sig. 2V CAD involving proxLAD and CTO of the RCA/  chronic total occlusion midRCA w/ bridging and L>R collaterals   Diastolic CHF ([place])    dx [id number] hospital admission (followed by pcp)   Diffuse large B cell lymphoma [facility]) oncologist-- dr Irene Limbo--- in remission   dx 08/ 2019 -- bx left tonsill mass-- involving lymph nodes--- completed chemotherapy 10-12-2018   DJD (degenerative joint disease)    Dyspnea    Dysthymic disorder    Elevated brain natriuretic peptide (BNP) level    Environmental allergies    GAD (generalized anxiety disorder)    GERD (gastroesophageal reflux disease)    History of falling    multiple times   History of Graves' disease    1987 s/p total thyroidectomy   History of kidney stones    History of scabies    07/ 2014  resolved  History of syncope    per D/C note in epic 02-21-2013 ?vasovagal   History of vertebral compression fracture    05/ 2014 --  L1, no surgical intervention   HLD (hyperlipidemia)    Hyperlipidemia    Hypertension    Hypogonadism in male    IDA (iron deficiency anemia)    Lumbago    OA (osteoarthritis)    OSA on CPAP    cpap machine-settings 17   Osteomyelitis of right wrist ([place]) 11/02/2012   Osteoporosis    Severe   Osteoporosis with fracture 04/24/2013   Pathological fracture due to osteoporosis with delayed healing 04/24/2013   Persistent atrial fibrillation [facility])     cardiologist--- dr Alfonso Patten. [place]   Plaque psoriasis    dermatologist--- dr Harriett Sine--- currently taking otezla   Post-surgical hypothyroidism    followed by pcp---  s/p total thyroidectomy for graves disease in 1987   Pure hypercholesterolemia 02/10/2021   Renal calculus, right    Right femoral fracture ([place]) 09/29/2012   Right forearm fracture 09/29/2012   Right tibial fracture 02/05/2013   Thrombocytopenia ([facility])    Unsteady gait    uses walker   Wears hearing aid in both ears     SURGICAL HISTORY: Past Surgical History:  Procedure Laterality Date   APPENDECTOMY  2011 approx.    CARDIAC CATHETERIZATION  12-31-2011   dr Lenna Sciara. Beatrix Fetters '@[facility]'$    normal LVF w/ multivessel CAD,  occluded RCA w/ colleterals   CATARACT EXTRACTION W/ INTRAOCULAR LENS  IMPLANT, BILATERAL  2016 approx.   COLONOSCOPY     CYSTOSCOPY WITH RETROGRADE PYELOGRAM, URETEROSCOPY AND STENT PLACEMENT Left 11/07/2017   Procedure: CYSTOSCOPY WITH RETROGRADE PYELOGRAM, URETEROSCOPY AND STENT PLACEMENT;  Surgeon: Cleon Gustin, MD;  Location[facility];  Service: Urology;  Laterality: Left;   CYSTOSCOPY WITH RETROGRADE PYELOGRAM, URETEROSCOPY AND STENT PLACEMENT Right 10/22/2019   Procedure: CYSTOSCOPY WITH RETROGRADE PYELOGRAM, URETEROSCOPY AND STENT PLACEMENT;  Surgeon: Cleon Gustin, MD;  Location: [facility];  Service: Urology;  Laterality: Right;   CYSTOSCOPY/URETEROSCOPY/HOLMIUM LASER/STENT PLACEMENT Right 03/11/2017   Procedure: CYSTOSCOPY/URETEROSCOPYSTENT PLACEMENT right ureter retrograde pylegram;  Surgeon: Cleon Gustin, MD;  [facility];  Service: Urology;  Laterality: Right;   HARDWARE REMOVAL  10/05/2012   Procedure: HARDWARE REMOVAL;  Surgeon: [name], MD;  [facility];  Service: Orthopedics;  Laterality: Right;  REMOVING  STRYKER  GAMMA NAIL   HARDWARE REMOVAL Right 07/03/2013   Procedure: HARDWARE REMOVAL RIGHT TIBIA ;  Surgeon: Rozanna Box, MD;  Location: [place];  Service: Orthopedics;  Laterality: Right;   HIP CLOSED REDUCTION Right 10/15/2013   Procedure: CLOSED MANIPULATION HIP;  Surgeon: [name], MD;  [facility];  Service: Orthopedics;  Laterality: Right;   HOLMIUM LASER APPLICATION Right 03/16/6811   Procedure: HOLMIUM LASER APPLICATION;  Surgeon: Cleon Gustin, MD;  [facility];  Service: Urology;  Laterality: Right;   HOLMIUM LASER APPLICATION Left 7/51/7001   Procedure: HOLMIUM LASER APPLICATION;  Surgeon: Cleon Gustin, MD;  [facility];  Service: Urology;  Laterality: Left;   HOLMIUM LASER APPLICATION Left 04/13/9448   Procedure: HOLMIUM LASER APPLICATION;  Surgeon: Cleon Gustin, MD;  [facility];  Service: Urology;  Laterality: Left;   HOLMIUM LASER APPLICATION Right 6/75/9163   Procedure: HOLMIUM LASER APPLICATION;  Surgeon: Cleon Gustin, MD;  Location: [facility];  Service: Urology;  Laterality: Right;   INCISION AND DRAINAGE HIP  Right 11/16/2013   Procedure: IRRIGATION AND DEBRIDEMENT RIGHT HIP;  Surgeon: [name], MD;  [facility];  Service: Orthopedics;  Laterality: Right;   INTRAVASCULAR PRESSURE WIRE/FFR STUDY N/A 08/13/2019   Procedure: INTRAVASCULAR PRESSURE WIRE/FFR STUDY;  Surgeon: Nelva Bush, MD;  Location: [place] [facility];  Service: Cardiovascular;  Laterality: N/A;   IR IMAGING GUIDED PORT INSERTION  06/22/2018   IR NEPHROSTOMY PLACEMENT LEFT  03/28/2019   IR URETERAL STENT LEFT NEW ACCESS W/O SEP NEPHROSTOMY CATH  10/24/2017   IR URETERAL STENT LEFT NEW ACCESS W/O SEP NEPHROSTOMY CATH  03/26/2019   NEPHROLITHOTOMY Right 02/08/2017   Procedure: NEPHROLITHOTOMY PERCUTANEOUS WITH SURGEON ACCESS;  Surgeon: Cleon Gustin, MD;  [facility];  Service: Urology;  Laterality: Right;   NEPHROLITHOTOMY Left 10/24/2017   Procedure: NEPHROLITHOTOMY PERCUTANEOUS;  Surgeon: Cleon Gustin, MD;  [facility];  Service: Urology;  Laterality: Left;    NEPHROLITHOTOMY Left 03/26/2019   Procedure: NEPHROLITHOTOMY PERCUTANEOUS;  Surgeon: Cleon Gustin, MD;  [facility];  Service: Urology;  Laterality: Left;  2 HRS   NEPHROLITHOTOMY Left 04/17/2019   Procedure: NEPHROLITHOTOMY PERCUTANEOUS;  Surgeon: Cleon Gustin, MD;  [facility];  Service: Urology;  Laterality: Left;  2 HRS   ORIF TIBIA FRACTURE Right 02/06/2013   Procedure: OPEN REDUCTION INTERNAL FIXATION (ORIF) TIBIA FRACTURE WITH IM ROD FIBULA;  Surgeon: Rozanna Box, MD;  Location: [place];  Service: Orthopedics;  Laterality: Right;   ORIF TIBIA FRACTURE Right 07/03/2013   Procedure: RIGHT TIBIA NON UNION REPAIR ;  Surgeon: Rozanna Box, MD;  Location: [place];  Service: Orthopedics;  Laterality: Right;   ORIF WRIST FRACTURE  10/02/2012   Procedure: OPEN REDUCTION INTERNAL FIXATION (ORIF) WRIST FRACTURE;  Surgeon: Roseanne Kaufman, MD;  [facility];  Service: Orthopedics;  Laterality: Right;  WITH   ANTIBIOTIC  CEMENT   ORIF WRIST FRACTURE Left 10/28/2013   Procedure: OPEN REDUCTION INTERNAL FIXATION (ORIF) WRIST FRACTURE with allograft;  Surgeon: Roseanne Kaufman, MD;  [facility];  Service: Orthopedics;  Laterality: Left;  DVR Plate   QUADRICEPS TENDON REPAIR Left 07/15/2017   Procedure: REPAIR QUADRICEP TENDON;  Surgeon: Frederik Pear, MD;  Location: [place];  Service: Orthopedics;  Laterality: Left;   RIGHT/LEFT HEART CATH AND CORONARY ANGIOGRAPHY N/A 08/13/2019   Procedure: RIGHT/LEFT HEART CATH AND CORONARY ANGIOGRAPHY;  Surgeon: Nelva Bush, MD;  Location: [place] [facility];  Service: Cardiovascular;  Laterality: N/A;   THYROIDECTOMY  02/1986   TOTAL HIP ARTHROPLASTY Right 03-16-2016   '@[facility]'$    TOTAL HIP REVISION  10/05/2012   Procedure: TOTAL HIP REVISION;  Surgeon: [name], MD;  [facility];  Service: Orthopedics;  Laterality: Right;  RIGHT TOTAL HIP REVISION   TOTAL HIP REVISION Right 09/17/2013   Procedure: REVISION RIGHT TOTAL HIP  ARTHROPLASTY ;  Surgeon: [name], MD;  [facility];  Service: Orthopedics;  Laterality: Right;   TOTAL HIP REVISION Right 10/26/2013   Procedure: REVISION RIGHT TOTAL HIP ARTHROPLASTY;  Surgeon: [name], MD;  [facility];  Service: Orthopedics;  Laterality: Right;   TOTAL KNEE ARTHROPLASTY Bilateral right 03-15-2011;  left 06-30-2011   TOTAL KNEE REVISION Left 04/11/2017   Procedure: TOTAL KNEE REVISION PATELLA and TIBIA;  Surgeon: Frederik Pear, MD;  Location: [place];  Service: Orthopedics;  Laterality: Left;    SOCIAL HISTORY: Social History   Socioeconomic History   Marital status: Married  Spouse name: Not on file   Number of children: Not on file   Years of education: Not on file   Highest education level: Not on file  Occupational History   Not on file  Tobacco Use   Smoking status: Former    Packs/day: 1.00    Years: 50.00    Total pack years: 50.00    Types: Cigarettes    Quit date: 01/12/2012    Years since quitting: 10.2   Smokeless tobacco: Never  Vaping Use   Vaping Use: Never used  Substance and Sexual Activity   Alcohol use: Not Currently    Comment: occasional-social   Drug use: Never   Sexual activity: Yes  Other Topics Concern   Not on file  Social History Narrative   [place] 2.5 months, went home Feb 21st slipped and fell on back and developed.  Home PT/OT.  Just started outpatient physical therapy.  Friday night, misstepped.       Pt spouse verbalized pt had fallen 4 times within the last 24 hours     Social Determinants of Health   Financial Resource Strain: Low Risk  (01/07/2022)   Overall Financial Resource Strain (CARDIA)    Difficulty of Paying Living Expenses: Not hard at all  Food Insecurity: No Food Insecurity (01/07/2022)   Hunger Vital Sign    Worried About Running Out of Food in the Last Year: Never true    Ran Out of Food in the Last Year: Never true  Transportation Needs: No Transportation Needs (01/07/2022)   PRAPARE  - Hydrologist (Medical): No    Lack of Transportation (Non-Medical): No  Physical Activity: Sufficiently Active (01/07/2022)   Exercise Vital Sign    Days of Exercise per Week: 3 days    Minutes of Exercise per Session: 60 min  Stress: No Stress Concern Present (01/07/2022)   [place]    Feeling of Stress : Not at all  Social Connections: [place] (01/07/2022)   Social Connection and Isolation Panel [NHANES]    Frequency of Communication with Friends and Family: More than three times a week    Frequency of Social Gatherings with Friends and Family: More than three times a week    Attends Religious Services: 1 to 4 times per year    Active Member of [employer] or Organizations: No    Attends Music therapist: 1 to 4 times per year    Marital Status: Married  Human resources officer Violence: Not At Risk (01/07/2022)   Humiliation, Afraid, Rape, and Kick questionnaire    Fear of Current or Ex-Partner: No    Emotionally Abused: No    Physically Abused: No    Sexually Abused: No    FAMILY HISTORY: Family History  Problem Relation Age of Onset   CAD Father 26   Asthma Father    Alcohol abuse Father    Arthritis Mother    Alcohol abuse Sister     ALLERGIES:  is allergic to short ragweed pollen ext.  MEDICATIONS:  Scheduled Meds:  alfuzosin  10 mg Oral QHS   atorvastatin  80 mg Oral Daily   buPROPion  300 mg Oral Daily   Chlorhexidine Gluconate Cloth  6 each Topical Daily   darifenacin  15 mg Oral Daily   diltiazem  240 mg Oral Daily   feeding supplement  237 mL Oral BID BM   isosorbide mononitrate  30 mg  Oral Daily   levothyroxine  100 mcg Oral QAC breakfast   rivaroxaban  10 mg Oral Daily   Continuous Infusions:  sodium chloride     PRN Meds:.acetaminophen **OR** acetaminophen, clonazePAM, ondansetron **OR** ondansetron (ZOFRAN) IV, sodium chloride flush  REVIEW  OF SYSTEMS:    10 Point review of Systems was done is negative except as noted above.   LABORATORY DATA:  I have reviewed the data as listed  .    Latest Ref Rng & Units 03/21/2022    6:08 AM 03/20/2022    5:47 AM 03/19/2022   12:33 PM  CBC  WBC 4.0 - 10.5 K/uL 6.0  7.0  8.4   Hemoglobin 13.0 - 17.0 g/dL 15.6  15.4  16.7   Hematocrit 39.0 - 52.0 % 46.6  45.6  48.1   Platelets 150 - 400 K/uL 142  143  173     .    Latest Ref Rng & Units 03/22/2022    5:02 AM 03/21/2022    6:08 AM 03/20/2022    5:47 AM  CMP  Glucose 70 - 99 mg/dL 107  101  108   BUN 8 - 23 mg/dL 25  30  41   Creatinine 0.61 - 1.24 mg/dL 1.21  1.32  2.10   Sodium 135 - 145 mmol/L 138  138  138   Potassium 3.5 - 5.1 mmol/L 3.5  3.7  3.8   Chloride 98 - 111 mmol/L 106  103  104   CO2 22 - 32 mmol/L '24  26  25   '$ Calcium 8.9 - 10.3 mg/dL 9.1  9.6  10.1   Total Protein 6.5 - 8.1 g/dL   6.1   Total Bilirubin 0.3 - 1.2 mg/dL   1.8   Alkaline Phos 38 - 126 U/L   64   AST 15 - 41 U/L   39   ALT 0 - 44 U/L   29      RADIOGRAPHIC STUDIES: I have personally reviewed the radiological images as listed and agreed with the findings in the report. CT HEAD WO CONTRAST (5MM)  Result Date: 03/22/2022 CLINICAL DATA:  Dizziness, persistent/recurrent, cardiac or vascular cause suspected EXAM: CT HEAD WITHOUT CONTRAST TECHNIQUE: Contiguous axial images were obtained from the base of the skull through the vertex without intravenous contrast. RADIATION DOSE REDUCTION: This exam was performed according to the departmental dose-optimization program which includes automated exposure control, adjustment of the mA and/or kV according to patient size and/or use of iterative reconstruction technique. COMPARISON:  MRI head Feb 27, 2022. FINDINGS: Brain: No evidence of acute infarction, hemorrhage, hydrocephalus, extra-axial collection or mass lesion/mass effect. Patchy white matter hypodensities, nonspecific but compatible with chronic  microvascular disease. Vascular: No hyperdense vessel identified. Skull: No acute fracture. Sinuses/Orbits: Clear sinuses.  No acute orbital findings. Other: Incompletely imaged known nasopharyngeal mass. Small right mastoid effusion. IMPRESSION: 1. No evidence of acute intracranial abnormality. 2. Incompletely imaged known nasopharyngeal mass. Electronically Signed   By: Margaretha Sheffield M.D.   On: 03/22/2022 11:20   [place] CORE BIOPSY (SOFT TISSUE)  Result Date: 03/17/2022 INDICATION: History of DLBCL with hypermetabolic LEFT gluteal/flank mass EXAM: ULTRASOUND-GUIDED LEFT GLUTEAL SOFT TISSUE MASS BIOPSY COMPARISON:  CT AP, 03/06/2022.  PET-CT, 03/02/2022 MEDICATIONS: None ANESTHESIA/SEDATION: Local anesthetic and single agent sedation was employed during this procedure. A total of fentanyl 50 mcg was administered intravenously. The patient's level of consciousness and vital signs were monitored continuously by radiology nursing throughout the procedure under my  direct supervision. COMPLICATIONS: None immediate. TECHNIQUE: Informed written consent was obtained from the patient and/or patient's representative after a discussion of the risks, benefits and alternatives to treatment. Questions regarding the procedure were encouraged and answered. Initial ultrasound scanning demonstrated hypervascular subcutaneous LEFT gluteal soft tissue mass. An ultrasound image was saved for documentation purposes. The procedure was planned. A timeout was performed prior to the initiation of the procedure. The operative was prepped and draped in the usual sterile fashion, and a sterile drape was applied covering the operative field. A timeout was performed prior to the initiation of the procedure. Local anesthesia was provided with 1% lidocaine with epinephrine. Under direct ultrasound guidance, an 18 gauge core needle device was utilized to obtain to obtain 4 core needle biopsies of the LEFT gluteal soft tissue mass. The samples  were placed in saline and submitted to pathology. The needle was removed and hemostasis was achieved with manual compression. Post procedure scan was negative for significant hematoma. A dressing was placed. The patient tolerated the procedure well without immediate postprocedural complication. IMPRESSION: Successful ultrasound guided biopsy of LEFT gluteal soft tissue mass, as above. Michaelle Birks, MD [facility] Electronically Signed   By: Michaelle Birks M.D.   On: 03/17/2022 09:32   MR BRAIN W WO CONTRAST  Result Date: 03/07/2022 CLINICAL DATA:  Brain metastases suspected EXAM: MRI HEAD WITHOUT AND WITH CONTRAST TECHNIQUE: Multiplanar, multiecho pulse sequences of the brain and surrounding structures were obtained without and with intravenous contrast. CONTRAST:  47m GADAVIST GADOBUTROL 1 MMOL/ML IV SOLN COMPARISON:  PET CT from 5 days ago FINDINGS: Brain: No enhancement or swelling to suggest metastatic disease. Small FLAIR hyperintensities in the cerebral white matter attributed to chronic small vessel ischemia. No acute infarct, hydrocephalus, or collection. Cerebral volume loss which is generalized. Vascular: Slow flow in the right transverse and sigmoid dural sinuses when comparing T2 and postcontrast imaging, attributed to tumoral compression in the right neck. Skull and upper cervical spine: No skull base erosion or visible bony metastasis. Sinuses/Orbits: Right mastoid opacification associated with a nasopharyngeal mass which is right eccentric. Other: Nasopharyngeal mass crossing midline but bulky towards the right with length of 6 cm and thickness of 18 mm. There is contact of the carotid and parapharyngeal spaces on the right. No evidence of intracranial extension IMPRESSION: 1. 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 No skull base erosion or intracranial extension. 2. Right mastoid effusion.  Electronically Signed   By: JJorje GuildM.D.   On: 03/07/2022 11:53   CT ABDOMEN PELVIS W CONTRAST  Result Date: 03/06/2022 CLINICAL DATA:  Urinary frequency. Undergoing treatment for urinary tract infection without improvement. Clinical concern for renal mass or abscess. Undergoing chemotherapy for diffuse large B cell lymphoma. EXAM: CT ABDOMEN AND PELVIS WITH CONTRAST TECHNIQUE: Multidetector CT imaging of the abdomen and pelvis was performed using the standard protocol following bolus administration of intravenous contrast. RADIATION DOSE REDUCTION: This exam was performed according to the departmental dose-optimization program which includes automated exposure control, adjustment of the mA and/or kV according to patient size and/or use of iterative reconstruction technique. CONTRAST:  1019mOMNIPAQUE IOHEXOL 300 MG/ML  SOLN COMPARISON:  PET-CT dated 03/02/2022 and abdomen and pelvis CT dated 09/05/2019. FINDINGS: Lower chest: The 4.3 x 3.9 cm right lower lobe lung mass seen on 03/02/2022 currently measures 4.7 x 4.4 cm on image number 15/4. There is interval visualization of a 5 mm similar appearing nodule  adjacent to this mass posteriorly and superiorly on image number 13/4. There is also bronchial mucous plugging at the posterior, inferior aspect of the mass, without significant change. The heart remains mildly enlarged. Minimal pericardial effusion with a maximum thickness of 6 mm. Atheromatous calcifications, including the coronary arteries and aorta. Hepatobiliary: Unremarkable liver. Mild sludge or noncalcified gallstones in the dependent portion of the gallbladder. No gallbladder wall thickening or pericholecystic fluid. Pancreas: Moderate diffuse pancreatic atrophy. Spleen: Normal in size without focal abnormality. Adrenals/Urinary Tract: Low-density right adrenal nodule with medium density thin internal septations in the superior aspect of the right adrenal gland, measuring 2.9 x 1.5 cm on  coronal image number 47, without significant change since 09/05/2019. No increased activity on the recent PET-CT. Unremarkable left adrenal gland. Tiny lower pole right renal calculus. 4 mm mid left renal calculus. Small mid left renal cyst. No bladder or ureteral calculi no hydronephrosis. The distal right ureter is obscured by streak artifacts from patient's right hip prosthesis. Stomach/Bowel: Distended rectum containing stool and gas. Moderate stool throughout the remainder of the colon. Unremarkable stomach and small bowel. The appendix is not visualized and surgically absent by history. Vascular/Lymphatic: Atheromatous arterial calcifications without aneurysm. Enlarged, recently hypermetabolic left inguinal lymph node with a short axis diameter 19 mm on image number 98/2. Multiple additional enlarged left pelvic lymph nodes, including a common femoral node with a short axis diameter of 31 mm on image number 84/2 and no obturator node with a short axis diameter of 27 mm on image number 81/2. Reproductive: Prostate is unremarkable. Other: Small to moderate-sized ventral hernia containing fat small to moderate-sized infraumbilical hernia containing a small to moderate-sized supraumbilical hernia containing fat without change. Small left inguinal hernia containing fat. Musculoskeletal: Right hip prosthesis with associated streak artifact. Severe L1 vertebral compression deformity with bony retropulsion causing moderate to marked canal stenosis without significant change. Associated acute kyphosis without significant change. No acute fractures or acute subluxations. Mild anterior spur formation at multiple levels in the lower thoracic spine and moderate to large anterior spurs at the anterior T12 and L2 levels on both sides of the severely compressed L1 vertebra. Stable anterior subluxation of the lumbar spine inferior to the L1 fracture relative to the spine above the fracture. Marked right psoas, iliacus and  iliopsoas muscle atrophy. IMPRESSION: 1. No renal mass or abscess. 2. Small, nonobstructing calculus in each kidney. 3. Mild increase in size of the previously demonstrated right lower lobe hypermetabolic mass with an interval small satellite nodule posteriorly. This is concerning for a primary lung neoplasm or lymphoma involving the lung. 4. Recently demonstrated left pelvic and inguinal adenopathy, most likely due to the patient's known lymphoma. 5. Stable small to moderate-sized supraumbilical hernia containing herniated fat. 6. Stable right adrenal probable adenoma. Electronically Signed   By: Claudie Revering M.D.   On: 03/06/2022 16:39   DG Chest Port 1 View  Result Date: 03/06/2022 CLINICAL DATA:  Possible sepsis. EXAM: PORTABLE CHEST 1 VIEW COMPARISON:  10/13/2021. FINDINGS: Cardiac silhouette is normal in size. No mediastinal or hilar masses. Right anterior chest wall Port-A-Cath is stable, tip in the mid superior vena cava. Chronic prominence of the interstitial markings bilaterally. Lungs otherwise clear. No pleural effusion or pneumothorax. Skeletal structures are grossly intact. IMPRESSION: No active disease. Electronically Signed   By: Lajean Manes M.D.   On: 03/06/2022 15:04   NM PET Image Restag (PS) Skull Base To Thigh  Result Date: 03/02/2022 CLINICAL DATA:  Follow-up treatment  strategy for diffuse large B-cell lymphoma. EXAM: NUCLEAR MEDICINE PET SKULL BASE TO THIGH TECHNIQUE: 0.8 mCi F-18 FDG was injected intravenously. Full-ring PET imaging was performed from the skull base to thigh after the radiotracer. CT data was obtained and used for attenuation correction and anatomic localization. Fasting blood glucose: 107 mg/dl COMPARISON:  PET-CT dated November 22, 2018; PET-CT dated August 22, 2018 FINDINGS: Mediastinal blood pool activity: SUV max 2.8 Liver activity: SUV max 3.8 NECK: New marked hypermetabolic soft tissue thickening of the right posterior nasopharynx with SUV max of 20.5.  Large lymph node located anterior to the right sternocleidomastoid muscle unchanged in size, measuring 2.2 cm in short axis, and demonstrates similar mild FDG uptake, SUV max of 2.6, previously 2.4. New hypermetabolic subcentimeter right level IIb lymph node measuring 5 mm on series 4, image 34. Incidental CT findings: none CHEST: New hypermetabolic right lower lobe lung mass measuring 3.9 x 4.3 cm with SUV max of 12.4. Incidental CT findings: Left main and three-vessel coronary artery calcifications. Atherosclerotic disease of the thoracic aorta. Right chest wall port with tip in the mid SVC. ABDOMEN/PELVIS: Enlarged hypermetabolic left pelvic and left inguinal lymph nodes. Reference left pelvic sidewall lymph node measuring 2.8 cm in short axis on series 4, image 190 with SUV max of 33.5. Enlarged left inguinal lymph node measuring 1.9 cm in short axis on series 4, image 207 with SUV max of hypermetabolic soft tissue nodules and masses of the left buttocks. Largest measures 7.7 x 4.1 cm on series 4, image 189 with SUV max of 18.5. Incidental CT findings: Simple appearing cyst of the left kidney, no further follow-up imaging is recommended for this lesion. Nonobstructing stone of the left kidney. Atherosclerotic disease of the abdominal aorta. Moderate fat containing umbilical hernia. Air is seen in the urinary bladder. SKELETON: No suspicious focal uptake to suggest osseous metastatic disease. Incidental CT findings: Prior total left hip arthroplasty with cerclage wires and surrounding hypermetabolic activity, similar to prior exam. Unchanged severe compression deformity at L1. Spinous process fractures with associated FDG uptake extending from approximately the T8-T1 levels. IMPRESSION: 1. New marked hypermetabolic soft tissue thickening of the right posterior nasopharynx. 2. New hypermetabolic subcentimeterright level IIb lymph node. 3. New hypermetabolic right lower lobe lung mass. 4. New enlarged and  hypermetabolic left pelvic and left inguinal lymph nodes. 5. New hypermetabolic soft tissue nodules and masses of the left buttocks. 6. Enlarged lymph node located anterior to the right sternocleidomastoid muscle is unchanged in size and demonstrates similar FDG uptake, likely treated disease. 7. Spinous process fractures with associated FDG uptake extending from approximately the T8-T1 levels. Correlate for history of recent trauma. 8. Air is seen in the urinary bladder, possibly due to recent instrumentation or infection. Correlate with urinalysis. Electronically Signed   By: Yetta Glassman M.D.   On: 03/02/2022 15:53   CT Head Wo Contrast  Result Date: 02/24/2022 CLINICAL DATA:  Head trauma. EXAM: CT HEAD WITHOUT CONTRAST TECHNIQUE: Contiguous axial images were obtained from the base of the skull through the vertex without intravenous contrast. RADIATION DOSE REDUCTION: This exam was performed according to the departmental dose-optimization program which includes automated exposure control, adjustment of the mA and/or kV according to patient size and/or use of iterative reconstruction technique. COMPARISON:  Head CT dated 11/08/2018. FINDINGS: Brain: Mild age-related atrophy and chronic microvascular ischemic changes. There is no acute intracranial hemorrhage. No mass effect or midline shift. No extra-axial fluid collection. Vascular: No hyperdense vessel or unexpected calcification.  Skull: Normal. Negative for fracture or focal lesion. Sinuses/Orbits: The visualized paranasal sinuses and the left mastoid air cells are clear. Mild right mastoid effusions. Other: None IMPRESSION: 1. No acute intracranial pathology. 2. Mild age-related atrophy and chronic microvascular ischemic changes. Electronically Signed   By: Anner Crete M.D.   On: 02/24/2022 20:35    ASSESSMENT & PLAN:   76 y.o. male with   1. Diffuse Large B-Cell Lymphoma,- Stage III Left tonsil -05/29/18 Left tonsil biopsy which revealed  Diffuse Large B-Cell Lymphoma, which requires treatment  -06/07/18 LDH at 214, will monitor change through treatment.  -Baseline ECHO 06/14/18: EF 50-55% -Standard regimen of R-CHOP with G-CSF support for 6 cycles starting 06/23/18   06/16/18 PET/CT revealed 1. Deauville 5 hypermetabolic lesions in the palatine tonsils, left internal jugular chain, and involving a right inguinal lymph node. Deauville 4 involvement of a left inguinal lymph node and an enlarged subcutaneous lesion favoring lymph node superficial to the lower right sternocleidomastoid muscle in the neck. 2. Other imaging findings of potential clinical significance: Aortic Atherosclerosis. Coronary atherosclerosis. Emphysema. Bilateral nonobstructive nephrolithiasis. Umbilical hernia contains adipose tissues. Old right rib fractures some of which are nonunited. 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    08/22/18 PET/CT revealed Interval response to therapy. Overall Deauville criteria 3. Interval decrease in size and FDG uptake associated with palatine tonsil lesions. There has been resolution of previous left level-II and bilateral inguinal lymph nodes. 2. Enlarged subcutaneous lesion superficial to the lower right sternocleidomastoid muscle exhibits decreased FDG uptake. Currently Deauville criteria 2. 3. No new or progressive sites of disease identified. 4.  Aortic Atherosclerosis. 5. Multi vessel coronary artery atherosclerotic calcifications. 6. Unchanged vertebra plana deformity. 7. Kidney stones.    S/p 6 cycles of R-CHOP completed on 10/12/18   11/22/18 PET/CT revealed The tonsillar mass has essentially resolved. The large presumed lymph node superficial to the right sternocleidomastoid is stable in size at 2.3 cm in short axis, with maximum SUV of 3.3 (formerly 2.4), representing Deauville 3 activity, previously Deauville 2. 2. Other imaging findings of potential clinical significance: Aortic Atherosclerosis and Emphysema. Coronary atherosclerosis. Bilateral  nonobstructive nephrolithiasis. Umbilical hernia contains adipose tissue. Chronic high activity posterior to the right hip implant along a region of bony irregularity and deformity probably reflecting chronic inflammation. L1 vertebra plana.   2.  Recurrent large B-cell lymphoma with left buttock soft tissue mass, pulmonary mass, widespread lymphadenopathy   #3 acute on chronic renal insufficiency likely from dehydration cannot rule out urinary retention given patient's chronic bladder issues.  Patient follows with Dr. Junious Silk of urology Creatinine back at baseline at 1.2 after IV fluids.  #4 altered mental status and failure to thrive in the context of poor p.o. intake and recent recurrent UTI-appears to be resolving with treatment of UTI and hydration.   Recent MRI of the brain showed no evidence of lymphoma involvement  PLAN -Patient's mental status is improved and so has his creatinine after hydration. At this time he has no fevers or urinary symptoms to suggest uncontrolled UTI.  We will continue to finish his IV antibiotics per hospital medicine. -We discussed with patient and his husband are agreeable to proceed with first cycle of R GCD chemotherapy for his relapsed large B-cell lymphoma today. -PT and OT evaluation to determine discharge needs since he has had multiple falls. -Given his fall with head trauma he did have a repeat CT head which showed no intracranial issues chronic anticoagulation. -We will continue to follow. -He will  be scheduled for cycle 1 day 8 of gemcitabine and G-CSF as outpatient. -Discharge needs to be determined based on evaluation by therapies. -Oncology will continue to follow  The total time spent in the appointment was 35 minutes*.  All of the patient's questions were answered with apparent satisfaction. The patient knows to call the clinic with any problems, questions or concerns.   Sullivan Lone MD MS AAHIVMS [facility] Hematology/Oncology  Physician [facility]  .*Total Encounter Time as defined by the Centers for Medicare and Medicaid Services includes, in addition to the face-to-face time of a patient visit (documented in the note above) non-face-to-face time: obtaining and reviewing outside history, ordering and reviewing medications, tests or procedures, care coordination (communications with other health care professionals or caregivers) and documentation in the medical record.

## 2022-03-23 NOTE — Progress Notes (Cosign Needed)
[name]  HEMATOLOGY/ONCOLOGY INPATIENT PROGRESS NOTE  Inpatient Attending: .Oswald Hillock, MD  SUBJECTIVE  Patient was seen in oncologic follow-up for management of his diffuse large B-cell lymphoma. Received first cycle of R-GCD on 03/22/2022.  Overall tolerated well.  He is a little bit more alert today but is still having some mild confusion according to his husband.  More talkative and interactive today. According nursing, he ate well this morning. He is not having any abdominal pain, nausea, vomiting. PT to reevaluate her today. No fevers.  OBJECTIVE:  NAD  PHYSICAL EXAMINATION: . Vitals:   03/22/22 1619 03/22/22 2207 03/23/22 0500 03/23/22 0602  BP: 108/62 115/69  124/70  Pulse: (!) 107 60  (!) 110  Resp: '16 20  20  '$ Temp: 98.7 F (37.1 C) 98.8 F (37.1 C)  98.4 F (36.9 C)  TempSrc: Oral Oral  Oral  SpO2: 94% 95%  97%  Weight:   114.2 kg   Height:       Filed Weights   03/21/22 0500 03/22/22 0500 03/23/22 0500  Weight: 115.9 kg 114.3 kg 114.2 kg   .Body mass index is 31.05 kg/m.  GENERAL:alert, in no acute distress and comfortable SKIN: skin color, texture, turgor are normal, no rashes or significant lesions.  Lesion to left hip covered with dressing but feels softer and smaller. EYES: normal, conjunctiva are pink and non-injected, sclera clear OROPHARYNX:no exudate, no erythema and lips, buccal mucosa, and tongue normal  NECK: supple, no JVD, thyroid normal size, non-tender, without nodularity LYMPH:  no palpable lymphadenopathy in the cervical, axillary or inguinal LUNGS: clear to auscultation with normal respiratory effort HEART: regular rate & rhythm,  no murmurs and no lower extremity edema ABDOMEN: abdomen soft, non-tender, normoactive bowel sounds  Musculoskeletal: no cyanosis of digits and no clubbing  PSYCH: alert & oriented x 3 with fluent speech, intermittent delayed recall. NEURO: no focal motor/sensory deficits  MEDICAL HISTORY:  Past Medical  History:  Diagnosis Date   Anticoagulant long-term use    xarelto--- takes for AFib,  managed by pcp, Dr Alain Marion   Benign localized prostatic hyperplasia with lower urinary tract symptoms (LUTS)    CAD (coronary artery disease) cardiologist--  dr t. Oval Linsey   hx of known CAD obstruction w/ collaterals (cath done @ [facility] 12-31-2011) ;   last cardiac cath 08-13-2019  showed sig. 2V CAD involving proxLAD and CTO of the RCA/  chronic total occlusion midRCA w/ bridging and L>R collaterals   Diastolic CHF ([place])    dx [phone] hospital admission (followed by pcp)   Diffuse large B cell lymphoma [facility]) oncologist-- dr Irene Limbo--- in remission   dx 08/ 2019 -- bx left tonsill mass-- involving lymph nodes--- completed chemotherapy 10-12-2018   DJD (degenerative joint disease)    Dyspnea    Dysthymic disorder    Elevated brain natriuretic peptide (BNP) level    Environmental allergies    GAD (generalized anxiety disorder)    GERD (gastroesophageal reflux disease)    History of falling    multiple times   History of Graves' disease    1987 s/p total thyroidectomy   History of kidney stones    History of scabies    07/ 2014  resolved   History of syncope    per D/C note in epic 02-21-2013 ?vasovagal   History of vertebral compression fracture    05/ 2014 --  L1, no surgical intervention   HLD (hyperlipidemia)    Hyperlipidemia    Hypertension  Hypogonadism in male    IDA (iron deficiency anemia)    Lumbago    OA (osteoarthritis)    OSA on CPAP    cpap machine-settings 17   Osteomyelitis of right wrist ([place]) 11/02/2012   Osteoporosis    Severe   Osteoporosis with fracture 04/24/2013   Pathological fracture due to osteoporosis with delayed healing 04/24/2013   Persistent atrial fibrillation [facility])    cardiologist--- dr Alfonso Patten. [place]   Plaque psoriasis    dermatologist--- dr Harriett Sine--- currently taking otezla   Post-surgical hypothyroidism    followed by pcp---  s/p total  thyroidectomy for graves disease in 1987   Pure hypercholesterolemia 02/10/2021   Renal calculus, right    Right femoral fracture ([place]) 09/29/2012   Right forearm fracture 09/29/2012   Right tibial fracture 02/05/2013   Thrombocytopenia ([facility])    Unsteady gait    uses walker   Wears hearing aid in both ears     SURGICAL HISTORY: Past Surgical History:  Procedure Laterality Date   APPENDECTOMY  2011 approx.    CARDIAC CATHETERIZATION  12-31-2011   dr j. Beatrix Fetters '@[facility]'$    normal LVF w/ multivessel CAD,  occluded RCA w/ colleterals   CATARACT EXTRACTION W/ INTRAOCULAR LENS  IMPLANT, BILATERAL  2016 approx.   COLONOSCOPY     CYSTOSCOPY WITH RETROGRADE PYELOGRAM, URETEROSCOPY AND STENT PLACEMENT Left 11/07/2017   Procedure: CYSTOSCOPY WITH RETROGRADE PYELOGRAM, URETEROSCOPY AND STENT PLACEMENT;  Surgeon: Cleon Gustin, MD;  Location: [facility];  Service: Urology;  Laterality: Left;   CYSTOSCOPY WITH RETROGRADE PYELOGRAM, URETEROSCOPY AND STENT PLACEMENT Right 10/22/2019   Procedure: CYSTOSCOPY WITH RETROGRADE PYELOGRAM, URETEROSCOPY AND STENT PLACEMENT;  Surgeon: Cleon Gustin, MD;  Location: [facility];  Service: Urology;  Laterality: Right;   CYSTOSCOPY/URETEROSCOPY/HOLMIUM LASER/STENT PLACEMENT Right 03/11/2017   Procedure: CYSTOSCOPY/URETEROSCOPYSTENT PLACEMENT right ureter retrograde pylegram;  Surgeon: Cleon Gustin, MD;  [facility];  Service: Urology;  Laterality: Right;   HARDWARE REMOVAL  10/05/2012   Procedure: HARDWARE REMOVAL;  Surgeon: [name], MD;  [facility];  Service: Orthopedics;  Laterality: Right;  REMOVING  STRYKER  GAMMA NAIL   HARDWARE REMOVAL Right 07/03/2013   Procedure: HARDWARE REMOVAL RIGHT TIBIA ;  Surgeon: Rozanna Box, MD;  Location: [place];  Service: Orthopedics;  Laterality: Right;   HIP CLOSED REDUCTION Right 10/15/2013   Procedure: CLOSED MANIPULATION HIP;  Surgeon: [name], MD;  [facility];  Service:  Orthopedics;  Laterality: Right;   HOLMIUM LASER APPLICATION Right 10/18/5629   Procedure: HOLMIUM LASER APPLICATION;  Surgeon: Cleon Gustin, MD;  [facility];  Service: Urology;  Laterality: Right;   HOLMIUM LASER APPLICATION Left 4/97/0263   Procedure: HOLMIUM LASER APPLICATION;  Surgeon: Cleon Gustin, MD;  [facility];  Service: Urology;  Laterality: Left;   HOLMIUM LASER APPLICATION Left 04/17/5884   Procedure: HOLMIUM LASER APPLICATION;  Surgeon: Cleon Gustin, MD;  [facility];  Service: Urology;  Laterality: Left;   HOLMIUM LASER APPLICATION Right 0/27/7412   Procedure: HOLMIUM LASER APPLICATION;  Surgeon: Cleon Gustin, MD;  Location: [facility];  Service: Urology;  Laterality: Right;   INCISION AND DRAINAGE HIP Right 11/16/2013   Procedure: IRRIGATION AND DEBRIDEMENT RIGHT HIP;  Surgeon: [name], MD;  [facility];  Service: Orthopedics;  Laterality: Right;   INTRAVASCULAR PRESSURE WIRE/FFR STUDY N/A 08/13/2019   Procedure: INTRAVASCULAR PRESSURE WIRE/FFR STUDY;  Surgeon: Nelva Bush, MD;  Location: [place] [facility];  Service: Cardiovascular;  Laterality: N/A;   IR IMAGING GUIDED PORT INSERTION  06/22/2018   IR NEPHROSTOMY PLACEMENT LEFT  03/28/2019   IR URETERAL STENT LEFT NEW ACCESS W/O SEP NEPHROSTOMY CATH  10/24/2017   IR URETERAL STENT LEFT NEW ACCESS W/O SEP NEPHROSTOMY CATH  03/26/2019   NEPHROLITHOTOMY Right 02/08/2017   Procedure: NEPHROLITHOTOMY PERCUTANEOUS WITH SURGEON ACCESS;  Surgeon: Cleon Gustin, MD;  [facility];  Service: Urology;  Laterality: Right;   NEPHROLITHOTOMY Left 10/24/2017   Procedure: NEPHROLITHOTOMY PERCUTANEOUS;  Surgeon: Cleon Gustin, MD;  [facility];  Service: Urology;  Laterality: Left;   NEPHROLITHOTOMY Left 03/26/2019   Procedure: NEPHROLITHOTOMY PERCUTANEOUS;  Surgeon: Cleon Gustin, MD;  [facility];  Service: Urology;  Laterality: Left;  2 HRS    NEPHROLITHOTOMY Left 04/17/2019   Procedure: NEPHROLITHOTOMY PERCUTANEOUS;  Surgeon: Cleon Gustin, MD;  [facility];  Service: Urology;  Laterality: Left;  2 HRS   ORIF TIBIA FRACTURE Right 02/06/2013   Procedure: OPEN REDUCTION INTERNAL FIXATION (ORIF) TIBIA FRACTURE WITH IM ROD FIBULA;  Surgeon: Rozanna Box, MD;  Location: [place];  Service: Orthopedics;  Laterality: Right;   ORIF TIBIA FRACTURE Right 07/03/2013   Procedure: RIGHT TIBIA NON UNION REPAIR ;  Surgeon: Rozanna Box, MD;  Location: [place];  Service: Orthopedics;  Laterality: Right;   ORIF WRIST FRACTURE  10/02/2012   Procedure: OPEN REDUCTION INTERNAL FIXATION (ORIF) WRIST FRACTURE;  Surgeon: Roseanne Kaufman, MD;  [facility];  Service: Orthopedics;  Laterality: Right;  WITH   ANTIBIOTIC  CEMENT   ORIF WRIST FRACTURE Left 10/28/2013   Procedure: OPEN REDUCTION INTERNAL FIXATION (ORIF) WRIST FRACTURE with allograft;  Surgeon: Roseanne Kaufman, MD;  [facility];  Service: Orthopedics;  Laterality: Left;  DVR Plate   QUADRICEPS TENDON REPAIR Left 07/15/2017   Procedure: REPAIR QUADRICEP TENDON;  Surgeon: Frederik Pear, MD;  Location: [place];  Service: Orthopedics;  Laterality: Left;   RIGHT/LEFT HEART CATH AND CORONARY ANGIOGRAPHY N/A 08/13/2019   Procedure: RIGHT/LEFT HEART CATH AND CORONARY ANGIOGRAPHY;  Surgeon: Nelva Bush, MD;  Location: [place] [facility];  Service: Cardiovascular;  Laterality: N/A;   THYROIDECTOMY  02/1986   TOTAL HIP ARTHROPLASTY Right 03-16-2016   '@[facility]'$    TOTAL HIP REVISION  10/05/2012   Procedure: TOTAL HIP REVISION;  Surgeon: [name], MD;  [facility];  Service: Orthopedics;  Laterality: Right;  RIGHT TOTAL HIP REVISION   TOTAL HIP REVISION Right 09/17/2013   Procedure: REVISION RIGHT TOTAL HIP ARTHROPLASTY ;  Surgeon: [name], MD;  [facility];  Service: Orthopedics;  Laterality: Right;   TOTAL HIP REVISION Right 10/26/2013   Procedure: REVISION RIGHT TOTAL HIP  ARTHROPLASTY;  Surgeon: [name], MD;  [facility];  Service: Orthopedics;  Laterality: Right;   TOTAL KNEE ARTHROPLASTY Bilateral right 03-15-2011;  left 06-30-2011   TOTAL KNEE REVISION Left 04/11/2017   Procedure: TOTAL KNEE REVISION PATELLA and TIBIA;  Surgeon: Frederik Pear, MD;  Location: [place];  Service: Orthopedics;  Laterality: Left;    SOCIAL HISTORY: Social History   Socioeconomic History   Marital status: Married    Spouse name: Not on file   Number of children: Not on file   Years of education: Not on file   Highest education level: Not on file  Occupational History   Not on file  Tobacco Use   Smoking status: Former  Packs/day: 1.00    Years: 50.00    Total pack years: 50.00    Types: Cigarettes    Quit date: 01/12/2012    Years since quitting: 10.2   Smokeless tobacco: Never  Vaping Use   Vaping Use: Never used  Substance and Sexual Activity   Alcohol use: Not Currently    Comment: occasional-social   Drug use: Never   Sexual activity: Yes  Other Topics Concern   Not on file  Social History Narrative   [place] 2.5 months, went home Feb 21st slipped and fell on back and developed.  Home PT/OT.  Just started outpatient physical therapy.  Friday night, misstepped.       Pt spouse verbalized pt had fallen 4 times within the last 24 hours     Social Determinants of Health   Financial Resource Strain: Low Risk  (01/07/2022)   Overall Financial Resource Strain (CARDIA)    Difficulty of Paying Living Expenses: Not hard at all  Food Insecurity: No Food Insecurity (01/07/2022)   Hunger Vital Sign    Worried About Running Out of Food in the Last Year: Never true    Ran Out of Food in the Last Year: Never true  Transportation Needs: No Transportation Needs (01/07/2022)   PRAPARE - Hydrologist (Medical): No    Lack of Transportation (Non-Medical): No  Physical Activity: Sufficiently Active (01/07/2022)   Exercise Vital Sign     Days of Exercise per Week: 3 days    Minutes of Exercise per Session: 60 min  Stress: No Stress Concern Present (01/07/2022)   [place]    Feeling of Stress : Not at all  Social Connections: [place] (01/07/2022)   Social Connection and Isolation Panel [NHANES]    Frequency of Communication with Friends and Family: More than three times a week    Frequency of Social Gatherings with Friends and Family: More than three times a week    Attends Religious Services: 1 to 4 times per year    Active Member of [employer] or Organizations: No    Attends Music therapist: 1 to 4 times per year    Marital Status: Married  Human resources officer Violence: Not At Risk (01/07/2022)   Humiliation, Afraid, Rape, and Kick questionnaire    Fear of Current or Ex-Partner: No    Emotionally Abused: No    Physically Abused: No    Sexually Abused: No    FAMILY HISTORY: Family History  Problem Relation Age of Onset   CAD Father 15   Asthma Father    Alcohol abuse Father    Arthritis Mother    Alcohol abuse Sister     ALLERGIES:  is allergic to short ragweed pollen ext.  MEDICATIONS:  Scheduled Meds:  alfuzosin  10 mg Oral QHS   atorvastatin  80 mg Oral Daily   buPROPion  300 mg Oral Daily   Chlorhexidine Gluconate Cloth  6 each Topical Daily   darifenacin  15 mg Oral Daily   dexamethasone  20 mg Oral Daily   diltiazem  240 mg Oral Daily   feeding supplement  237 mL Oral BID BM   isosorbide mononitrate  30 mg Oral Daily   levothyroxine  100 mcg Oral QAC breakfast   rivaroxaban  10 mg Oral Daily   Continuous Infusions:  sodium chloride     PRN Meds:.acetaminophen **OR** acetaminophen, clonazePAM, ondansetron **OR** ondansetron (ZOFRAN) IV,  sodium chloride flush  REVIEW OF SYSTEMS:    10 Point review of Systems was done is negative except as noted above.   LABORATORY DATA:  I have reviewed the data as  listed  .    Latest Ref Rng & Units 03/21/2022    6:08 AM 03/20/2022    5:47 AM 03/19/2022   12:33 PM  CBC  WBC 4.0 - 10.5 K/uL 6.0  7.0  8.4   Hemoglobin 13.0 - 17.0 g/dL 15.6  15.4  16.7   Hematocrit 39.0 - 52.0 % 46.6  45.6  48.1   Platelets 150 - 400 K/uL 142  143  173     .    Latest Ref Rng & Units 03/22/2022    5:02 AM 03/21/2022    6:08 AM 03/20/2022    5:47 AM  CMP  Glucose 70 - 99 mg/dL 107  101  108   BUN 8 - 23 mg/dL 25  30  41   Creatinine 0.61 - 1.24 mg/dL 1.21  1.32  2.10   Sodium 135 - 145 mmol/L 138  138  138   Potassium 3.5 - 5.1 mmol/L 3.5  3.7  3.8   Chloride 98 - 111 mmol/L 106  103  104   CO2 22 - 32 mmol/L '24  26  25   '$ Calcium 8.9 - 10.3 mg/dL 9.1  9.6  10.1   Total Protein 6.5 - 8.1 g/dL   6.1   Total Bilirubin 0.3 - 1.2 mg/dL   1.8   Alkaline Phos 38 - 126 U/L   64   AST 15 - 41 U/L   39   ALT 0 - 44 U/L   29      RADIOGRAPHIC STUDIES: I have personally reviewed the radiological images as listed and agreed with the findings in the report. CT HEAD WO CONTRAST (5MM)  Result Date: 03/22/2022 CLINICAL DATA:  Dizziness, persistent/recurrent, cardiac or vascular cause suspected EXAM: CT HEAD WITHOUT CONTRAST TECHNIQUE: Contiguous axial images were obtained from the base of the skull through the vertex without intravenous contrast. RADIATION DOSE REDUCTION: This exam was performed according to the departmental dose-optimization program which includes automated exposure control, adjustment of the mA and/or kV according to patient size and/or use of iterative reconstruction technique. COMPARISON:  MRI head Feb 27, 2022. FINDINGS: Brain: No evidence of acute infarction, hemorrhage, hydrocephalus, extra-axial collection or mass lesion/mass effect. Patchy white matter hypodensities, nonspecific but compatible with chronic microvascular disease. Vascular: No hyperdense vessel identified. Skull: No acute fracture. Sinuses/Orbits: Clear sinuses.  No acute orbital  findings. Other: Incompletely imaged known nasopharyngeal mass. Small right mastoid effusion. IMPRESSION: 1. No evidence of acute intracranial abnormality. 2. Incompletely imaged known nasopharyngeal mass. Electronically Signed   By: Margaretha Sheffield M.D.   On: 03/22/2022 11:20   [place] CORE BIOPSY (SOFT TISSUE)  Result Date: 03/17/2022 INDICATION: History of DLBCL with hypermetabolic LEFT gluteal/flank mass EXAM: ULTRASOUND-GUIDED LEFT GLUTEAL SOFT TISSUE MASS BIOPSY COMPARISON:  CT AP, 03/06/2022.  PET-CT, 03/02/2022 MEDICATIONS: None ANESTHESIA/SEDATION: Local anesthetic and single agent sedation was employed during this procedure. A total of fentanyl 50 mcg was administered intravenously. The patient's level of consciousness and vital signs were monitored continuously by radiology nursing throughout the procedure under my direct supervision. COMPLICATIONS: None immediate. TECHNIQUE: Informed written consent was obtained from the patient and/or patient's representative after a discussion of the risks, benefits and alternatives to treatment. Questions regarding the procedure were encouraged and answered. Initial ultrasound scanning demonstrated  hypervascular subcutaneous LEFT gluteal soft tissue mass. An ultrasound image was saved for documentation purposes. The procedure was planned. A timeout was performed prior to the initiation of the procedure. The operative was prepped and draped in the usual sterile fashion, and a sterile drape was applied covering the operative field. A timeout was performed prior to the initiation of the procedure. Local anesthesia was provided with 1% lidocaine with epinephrine. Under direct ultrasound guidance, an 18 gauge core needle device was utilized to obtain to obtain 4 core needle biopsies of the LEFT gluteal soft tissue mass. The samples were placed in saline and submitted to pathology. The needle was removed and hemostasis was achieved with manual compression. Post procedure  scan was negative for significant hematoma. A dressing was placed. The patient tolerated the procedure well without immediate postprocedural complication. IMPRESSION: Successful ultrasound guided biopsy of LEFT gluteal soft tissue mass, as above. Michaelle Birks, MD [facility] Radiology Electronically Signed   By: Michaelle Birks M.D.   On: 03/17/2022 09:32   MR BRAIN W WO CONTRAST  Result Date: 03/07/2022 CLINICAL DATA:  Brain metastases suspected EXAM: MRI HEAD WITHOUT AND WITH CONTRAST TECHNIQUE: Multiplanar, multiecho pulse sequences of the brain and surrounding structures were obtained without and with intravenous contrast. CONTRAST:  36m GADAVIST GADOBUTROL 1 MMOL/ML IV SOLN COMPARISON:  PET CT from 5 days ago FINDINGS: Brain: No enhancement or swelling to suggest metastatic disease. Small FLAIR hyperintensities in the cerebral white matter attributed to chronic small vessel ischemia. No acute infarct, hydrocephalus, or collection. Cerebral volume loss which is generalized. Vascular: Slow flow in the right transverse and sigmoid dural sinuses when comparing T2 and postcontrast imaging, attributed to tumoral compression in the right neck. Skull and upper cervical spine: No skull base erosion or visible bony metastasis. Sinuses/Orbits: Right mastoid opacification associated with a nasopharyngeal mass which is right eccentric. Other: Nasopharyngeal mass crossing midline but bulky towards the right with length of 6 cm and thickness of 18 mm. There is contact of the carotid and parapharyngeal spaces on the right. No evidence of intracranial extension IMPRESSION: 1. 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 No skull base erosion or intracranial extension. 2. Right mastoid effusion. Electronically Signed   By: JJorje GuildM.D.   On: 03/07/2022 11:53   CT ABDOMEN PELVIS W CONTRAST  Result Date: 03/06/2022 CLINICAL  DATA:  Urinary frequency. Undergoing treatment for urinary tract infection without improvement. Clinical concern for renal mass or abscess. Undergoing chemotherapy for diffuse large B cell lymphoma. EXAM: CT ABDOMEN AND PELVIS WITH CONTRAST TECHNIQUE: Multidetector CT imaging of the abdomen and pelvis was performed using the standard protocol following bolus administration of intravenous contrast. RADIATION DOSE REDUCTION: This exam was performed according to the departmental dose-optimization program which includes automated exposure control, adjustment of the mA and/or kV according to patient size and/or use of iterative reconstruction technique. CONTRAST:  1044mOMNIPAQUE IOHEXOL 300 MG/ML  SOLN COMPARISON:  PET-CT dated 03/02/2022 and abdomen and pelvis CT dated 09/05/2019. FINDINGS: Lower chest: The 4.3 x 3.9 cm right lower lobe lung mass seen on 03/02/2022 currently measures 4.7 x 4.4 cm on image number 15/4. There is interval visualization of a 5 mm similar appearing nodule adjacent to this mass posteriorly and superiorly on image number 13/4. There is also bronchial mucous plugging at the posterior, inferior aspect of the mass, without significant change. The heart remains mildly enlarged. Minimal pericardial effusion with a maximum thickness  of 6 mm. Atheromatous calcifications, including the coronary arteries and aorta. Hepatobiliary: Unremarkable liver. Mild sludge or noncalcified gallstones in the dependent portion of the gallbladder. No gallbladder wall thickening or pericholecystic fluid. Pancreas: Moderate diffuse pancreatic atrophy. Spleen: Normal in size without focal abnormality. Adrenals/Urinary Tract: Low-density right adrenal nodule with medium density thin internal septations in the superior aspect of the right adrenal gland, measuring 2.9 x 1.5 cm on coronal image number 47, without significant change since 09/05/2019. No increased activity on the recent PET-CT. Unremarkable left adrenal  gland. Tiny lower pole right renal calculus. 4 mm mid left renal calculus. Small mid left renal cyst. No bladder or ureteral calculi no hydronephrosis. The distal right ureter is obscured by streak artifacts from patient's right hip prosthesis. Stomach/Bowel: Distended rectum containing stool and gas. Moderate stool throughout the remainder of the colon. Unremarkable stomach and small bowel. The appendix is not visualized and surgically absent by history. Vascular/Lymphatic: Atheromatous arterial calcifications without aneurysm. Enlarged, recently hypermetabolic left inguinal lymph node with a short axis diameter 19 mm on image number 98/2. Multiple additional enlarged left pelvic lymph nodes, including a common femoral node with a short axis diameter of 31 mm on image number 84/2 and no obturator node with a short axis diameter of 27 mm on image number 81/2. Reproductive: Prostate is unremarkable. Other: Small to moderate-sized ventral hernia containing fat small to moderate-sized infraumbilical hernia containing a small to moderate-sized supraumbilical hernia containing fat without change. Small left inguinal hernia containing fat. Musculoskeletal: Right hip prosthesis with associated streak artifact. Severe L1 vertebral compression deformity with bony retropulsion causing moderate to marked canal stenosis without significant change. Associated acute kyphosis without significant change. No acute fractures or acute subluxations. Mild anterior spur formation at multiple levels in the lower thoracic spine and moderate to large anterior spurs at the anterior T12 and L2 levels on both sides of the severely compressed L1 vertebra. Stable anterior subluxation of the lumbar spine inferior to the L1 fracture relative to the spine above the fracture. Marked right psoas, iliacus and iliopsoas muscle atrophy. IMPRESSION: 1. No renal mass or abscess. 2. Small, nonobstructing calculus in each kidney. 3. Mild increase in size of  the previously demonstrated right lower lobe hypermetabolic mass with an interval small satellite nodule posteriorly. This is concerning for a primary lung neoplasm or lymphoma involving the lung. 4. Recently demonstrated left pelvic and inguinal adenopathy, most likely due to the patient's known lymphoma. 5. Stable small to moderate-sized supraumbilical hernia containing herniated fat. 6. Stable right adrenal probable adenoma. Electronically Signed   By: Claudie Revering M.D.   On: 03/06/2022 16:39   DG Chest Port 1 View  Result Date: 03/06/2022 CLINICAL DATA:  Possible sepsis. EXAM: PORTABLE CHEST 1 VIEW COMPARISON:  10/13/2021. FINDINGS: Cardiac silhouette is normal in size. No mediastinal or hilar masses. Right anterior chest wall Port-A-Cath is stable, tip in the mid superior vena cava. Chronic prominence of the interstitial markings bilaterally. Lungs otherwise clear. No pleural effusion or pneumothorax. Skeletal structures are grossly intact. IMPRESSION: No active disease. Electronically Signed   By: Lajean Manes M.D.   On: 03/06/2022 15:04   NM PET Image Restag (PS) Skull Base To Thigh  Result Date: 03/02/2022 CLINICAL DATA:  Follow-up treatment strategy for diffuse large B-cell lymphoma. EXAM: NUCLEAR MEDICINE PET SKULL BASE TO THIGH TECHNIQUE: 0.8 mCi F-18 FDG was injected intravenously. Full-ring PET imaging was performed from the skull base to thigh after the radiotracer. CT data was obtained  and used for attenuation correction and anatomic localization. Fasting blood glucose: 107 mg/dl COMPARISON:  PET-CT dated November 22, 2018; PET-CT dated August 22, 2018 FINDINGS: Mediastinal blood pool activity: SUV max 2.8 Liver activity: SUV max 3.8 NECK: New marked hypermetabolic soft tissue thickening of the right posterior nasopharynx with SUV max of 20.5. Large lymph node located anterior to the right sternocleidomastoid muscle unchanged in size, measuring 2.2 cm in short axis, and demonstrates similar  mild FDG uptake, SUV max of 2.6, previously 2.4. New hypermetabolic subcentimeter right level IIb lymph node measuring 5 mm on series 4, image 34. Incidental CT findings: none CHEST: New hypermetabolic right lower lobe lung mass measuring 3.9 x 4.3 cm with SUV max of 12.4. Incidental CT findings: Left main and three-vessel coronary artery calcifications. Atherosclerotic disease of the thoracic aorta. Right chest wall port with tip in the mid SVC. ABDOMEN/PELVIS: Enlarged hypermetabolic left pelvic and left inguinal lymph nodes. Reference left pelvic sidewall lymph node measuring 2.8 cm in short axis on series 4, image 190 with SUV max of 33.5. Enlarged left inguinal lymph node measuring 1.9 cm in short axis on series 4, image 207 with SUV max of hypermetabolic soft tissue nodules and masses of the left buttocks. Largest measures 7.7 x 4.1 cm on series 4, image 189 with SUV max of 18.5. Incidental CT findings: Simple appearing cyst of the left kidney, no further follow-up imaging is recommended for this lesion. Nonobstructing stone of the left kidney. Atherosclerotic disease of the abdominal aorta. Moderate fat containing umbilical hernia. Air is seen in the urinary bladder. SKELETON: No suspicious focal uptake to suggest osseous metastatic disease. Incidental CT findings: Prior total left hip arthroplasty with cerclage wires and surrounding hypermetabolic activity, similar to prior exam. Unchanged severe compression deformity at L1. Spinous process fractures with associated FDG uptake extending from approximately the T8-T1 levels. IMPRESSION: 1. New marked hypermetabolic soft tissue thickening of the right posterior nasopharynx. 2. New hypermetabolic subcentimeterright level IIb lymph node. 3. New hypermetabolic right lower lobe lung mass. 4. New enlarged and hypermetabolic left pelvic and left inguinal lymph nodes. 5. New hypermetabolic soft tissue nodules and masses of the left buttocks. 6. Enlarged lymph node  located anterior to the right sternocleidomastoid muscle is unchanged in size and demonstrates similar FDG uptake, likely treated disease. 7. Spinous process fractures with associated FDG uptake extending from approximately the T8-T1 levels. Correlate for history of recent trauma. 8. Air is seen in the urinary bladder, possibly due to recent instrumentation or infection. Correlate with urinalysis. Electronically Signed   By: Yetta Glassman M.D.   On: 03/02/2022 15:53   CT Head Wo Contrast  Result Date: 02/24/2022 CLINICAL DATA:  Head trauma. EXAM: CT HEAD WITHOUT CONTRAST TECHNIQUE: Contiguous axial images were obtained from the base of the skull through the vertex without intravenous contrast. RADIATION DOSE REDUCTION: This exam was performed according to the departmental dose-optimization program which includes automated exposure control, adjustment of the mA and/or kV according to patient size and/or use of iterative reconstruction technique. COMPARISON:  Head CT dated 11/08/2018. FINDINGS: Brain: Mild age-related atrophy and chronic microvascular ischemic changes. There is no acute intracranial hemorrhage. No mass effect or midline shift. No extra-axial fluid collection. Vascular: No hyperdense vessel or unexpected calcification. Skull: Normal. Negative for fracture or focal lesion. Sinuses/Orbits: The visualized paranasal sinuses and the left mastoid air cells are clear. Mild right mastoid effusions. Other: None IMPRESSION: 1. No acute intracranial pathology. 2. Mild age-related atrophy and chronic microvascular  ischemic changes. Electronically Signed   By: Anner Crete M.D.   On: 02/24/2022 20:35    ASSESSMENT & PLAN:   76 y.o. male with   1. Diffuse Large B-Cell Lymphoma,- Stage III Left tonsil -05/29/18 Left tonsil biopsy which revealed Diffuse Large B-Cell Lymphoma, which requires treatment  -06/07/18 LDH at 214, will monitor change through treatment.  -Baseline ECHO 06/14/18: EF  50-55% -Standard regimen of R-CHOP with G-CSF support for 6 cycles starting 06/23/18   06/16/18 PET/CT revealed 1. Deauville 5 hypermetabolic lesions in the palatine tonsils, left internal jugular chain, and involving a right inguinal lymph node. Deauville 4 involvement of a left inguinal lymph node and an enlarged subcutaneous lesion favoring lymph node superficial to the lower right sternocleidomastoid muscle in the neck. 2. Other imaging findings of potential clinical significance: Aortic Atherosclerosis. Coronary atherosclerosis. Emphysema. Bilateral nonobstructive nephrolithiasis. Umbilical hernia contains adipose tissues. Old right rib fractures some of which are nonunited. 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    08/22/18 PET/CT revealed Interval response to therapy. Overall Deauville criteria 3. Interval decrease in size and FDG uptake associated with palatine tonsil lesions. There has been resolution of previous left level-II and bilateral inguinal lymph nodes. 2. Enlarged subcutaneous lesion superficial to the lower right sternocleidomastoid muscle exhibits decreased FDG uptake. Currently Deauville criteria 2. 3. No new or progressive sites of disease identified. 4.  Aortic Atherosclerosis. 5. Multi vessel coronary artery atherosclerotic calcifications. 6. Unchanged vertebra plana deformity. 7. Kidney stones.    S/p 6 cycles of R-CHOP completed on 10/12/18   11/22/18 PET/CT revealed The tonsillar mass has essentially resolved. The large presumed lymph node superficial to the right sternocleidomastoid is stable in size at 2.3 cm in short axis, with maximum SUV of 3.3 (formerly 2.4), representing Deauville 3 activity, previously Deauville 2. 2. Other imaging findings of potential clinical significance: Aortic Atherosclerosis and Emphysema. Coronary atherosclerosis. Bilateral nonobstructive nephrolithiasis. Umbilical hernia contains adipose tissue. Chronic high activity posterior to the right hip implant along a region of  bony irregularity and deformity probably reflecting chronic inflammation. L1 vertebra plana.   2.  Recurrent large B-cell lymphoma with left buttock soft tissue mass, pulmonary mass, widespread lymphadenopathy   #3 acute on chronic renal insufficiency likely from dehydration cannot rule out urinary retention given patient's chronic bladder issues.  Patient follows with Dr. Junious Silk of urology Creatinine back at baseline at 1.2 after IV fluids.  #4 altered mental status and failure to thrive in the context of poor p.o. intake and recent recurrent UTI-appears to be resolving with treatment of UTI and hydration.   Recent MRI of the brain showed no evidence of lymphoma involvement  PLAN -Patient's mental status is improved and so has his creatinine (after yesterday's labs) after hydration. -We will obtain labs today including a CBC with differential, CMET, uric acid level. -CT head showed no intracranial issues and his Xarelto has been resumed. -PT and OT evaluation to determine discharge needs since he has had multiple falls.  He would be agreeable to going to SNF for short-term rehab. -He will be scheduled for cycle 1 day 8 of gemcitabine and G-CSF as outpatient. -Discharge needs to be determined based on evaluation by therapies. -Oncology will continue to follow  [place]  .Patient was Personally and independently interviewed, examined and relevant elements of the history of present illness were reviewed in details and an assessment and plan was created. All elements of the patient's history of present illness , assessment and plan  were discussed in details with Mikey Bussing DNP. The above documentation reflects our combined findings assessment and plan.   Sullivan Lone MD MS

## 2022-03-24 DIAGNOSIS — E669 Obesity, unspecified: Secondary | ICD-10-CM

## 2022-03-24 DIAGNOSIS — N179 Acute kidney failure, unspecified: Secondary | ICD-10-CM | POA: Diagnosis not present

## 2022-03-24 DIAGNOSIS — Z7901 Long term (current) use of anticoagulants: Secondary | ICD-10-CM | POA: Diagnosis not present

## 2022-03-24 DIAGNOSIS — I5032 Chronic diastolic (congestive) heart failure: Secondary | ICD-10-CM | POA: Diagnosis not present

## 2022-03-24 DIAGNOSIS — C8338 Diffuse large B-cell lymphoma, lymph nodes of multiple sites: Secondary | ICD-10-CM

## 2022-03-24 DIAGNOSIS — J449 Chronic obstructive pulmonary disease, unspecified: Secondary | ICD-10-CM | POA: Diagnosis not present

## 2022-03-24 LAB — CBC WITH DIFFERENTIAL/PLATELET
Abs Immature Granulocytes: 0.03 10*3/uL (ref 0.00–0.07)
Basophils Absolute: 0 10*3/uL (ref 0.0–0.1)
Basophils Relative: 0 %
Eosinophils Absolute: 0 10*3/uL (ref 0.0–0.5)
Eosinophils Relative: 0 %
HCT: 44.1 % (ref 39.0–52.0)
Hemoglobin: 15 g/dL (ref 13.0–17.0)
Immature Granulocytes: 0 %
Lymphocytes Relative: 5 %
Lymphs Abs: 0.4 10*3/uL — ABNORMAL LOW (ref 0.7–4.0)
MCH: 31.7 pg (ref 26.0–34.0)
MCHC: 34 g/dL (ref 30.0–36.0)
MCV: 93.2 fL (ref 80.0–100.0)
Monocytes Absolute: 0.2 10*3/uL (ref 0.1–1.0)
Monocytes Relative: 2 %
Neutro Abs: 8.3 10*3/uL — ABNORMAL HIGH (ref 1.7–7.7)
Neutrophils Relative %: 93 %
Platelets: 142 10*3/uL — ABNORMAL LOW (ref 150–400)
RBC: 4.73 MIL/uL (ref 4.22–5.81)
RDW: 15.6 % — ABNORMAL HIGH (ref 11.5–15.5)
WBC: 8.9 10*3/uL (ref 4.0–10.5)
nRBC: 0 % (ref 0.0–0.2)

## 2022-03-24 LAB — COMPREHENSIVE METABOLIC PANEL
ALT: 35 U/L (ref 0–44)
AST: 36 U/L (ref 15–41)
Albumin: 3 g/dL — ABNORMAL LOW (ref 3.5–5.0)
Alkaline Phosphatase: 57 U/L (ref 38–126)
Anion gap: 8 (ref 5–15)
BUN: 37 mg/dL — ABNORMAL HIGH (ref 8–23)
CO2: 25 mmol/L (ref 22–32)
Calcium: 9 mg/dL (ref 8.9–10.3)
Chloride: 105 mmol/L (ref 98–111)
Creatinine, Ser: 1.15 mg/dL (ref 0.61–1.24)
GFR, Estimated: >60 mL/min (ref >60)
Glucose, Bld: 147 mg/dL — ABNORMAL HIGH (ref 70–99)
Potassium: 4.4 mmol/L (ref 3.5–5.1)
Sodium: 138 mmol/L (ref 135–145)
Total Bilirubin: 1.1 mg/dL (ref 0.3–1.2)
Total Protein: 6.1 g/dL — ABNORMAL LOW (ref 6.5–8.1)

## 2022-03-24 LAB — MAGNESIUM: Magnesium: 1.9 mg/dL (ref 1.7–2.4)

## 2022-03-24 LAB — PHOSPHORUS: Phosphorus: 3.1 mg/dL (ref 2.5–4.6)

## 2022-03-24 MED ORDER — ORAL CARE MOUTH RINSE: 15.0000 mL | OROMUCOSAL | Status: DC | PRN

## 2022-03-24 NOTE — NC FL2 (Signed)
[place] LEVEL OF CARE SCREENING TOOL     IDENTIFICATION  Patient Name: [name] Birthdate: [birth year] Sex: male Admission Date (Current Location): 03/19/2022  [facility] and [place] Number:      Facility and Address:  [facility],  [address], [place]      Provider Number: [id number]  Attending Physician Name and Address:  Kerney Elbe, DO  Relative Name and Phone Number:  [name] Ruggles(spouse)[phone]    Current Level of Care: Hospital Recommended Level of Care:   Prior Approval Number:    Date Approved/Denied:   PASRR Number: [id number]  Discharge Plan: SNF    Current Diagnoses: Patient Active Problem List   Diagnosis Date Noted   Encounter for antineoplastic chemotherapy    AKI (acute kidney injury) ([place]) 03/19/2022   DNR (do not resuscitate)/DNI(Do Not Intubate) 03/19/2022   Lung mass 03/06/2022   Stage 3a chronic kidney disease (CKD) ([place]) - Baseline Scr 1.5 - 1.6 03/06/2022   Allergic rhinitis 01/25/2022   Allergic rhinitis due to animal (cat) (dog) hair and dander 01/25/2022   Allergic rhinitis due to pollen 01/25/2022   Mild intermittent asthma 01/25/2022   Vasomotor rhinitis 01/25/2022   Multiple skin nodules 01/25/2022   Weight loss 01/25/2022   Pure hypercholesterolemia 02/10/2021   Chronic diastolic heart failure ([place]) 06/21/2019   H/O nephrolithotomy with removal of calculi 03/26/2019   Renal calculus 03/26/2019   History of chemotherapy 11/21/2018   Lymphoma in remission ([place]) 11/21/2018   Generalized weakness 11/08/2018   Well adult exam 10/02/2018   Counseling regarding advance care planning and goals of care 07/09/2018   Diffuse large B-cell lymphoma ([place]) 06/23/2018   Port-A-Cath in place 06/23/2018   Hypogonadism in male 11/22/2017   Nephrolithiasis 10/24/2017   Recurrent dislocation of left patella 07/15/2017   Recurrent subluxation of patella, left 07/08/2017   At high risk  for falls 05/17/2017   S/P revision of total knee 04/11/2017   Failed total knee, left, initial encounter ([place]) 04/09/2017   Renal calculus, right 02/08/2017   Protein-calorie malnutrition, severe ([place]) 12/08/2016   Chronic obstructive pulmonary disease ([place]) 12/08/2016   Staghorn renal calculus 11/05/2016   Senile osteoporosis 06/08/2016   Dyslipidemia 01/12/2016   Insomnia secondary to depression with anxiety 12/02/2015   Generalized anxiety disorder 11/19/2015   Wound drainage right hip 11/15/2013   Expected blood loss anemia 09/18/2013   S/P right TH revision 09/17/2013   BPH with obstruction/lower urinary tract symptoms 08/03/2013   History of gastroesophageal reflux (GERD) 08/03/2013   OSA (obstructive sleep apnea) 08/03/2013   Paroxysmal atrial fibrillation ([place]) 08/03/2013   Presence of unspecified artificial hip joint 08/03/2013   Nonunion, fracture, Right tibia  07/03/2013   Chronic anticoagulation 02/21/2013   Fall 02/08/2013   Physical deconditioning 02/08/2013   Low back pain radiating to both legs 02/08/2013   Compression fracture of L1 lumbar vertebra ([place]) 02/08/2013   Morbid obesity ([place]) 02/05/2013   Anemia 10/06/2012   Essential hypertension 10/03/2012   Psoriasis 10/03/2012   Vitamin D deficiency 10/03/2012   GERD (gastroesophageal reflux disease) 10/03/2012   Anxiety 10/03/2012   Depression 10/03/2012   CAD (coronary artery disease) 10/03/2012   DJD (degenerative joint disease) 10/03/2012   Chronic gingivitis 10/03/2012   Low testosterone, possible hypogonadism 10/01/2012   Normocytic anemia 09/30/2012   Longstanding persistent atrial fibrillation ([place]) 09/29/2012   Hypothyroidism 09/29/2012   History of recurrent UTIs 09/29/2012    Orientation RESPIRATION  BLADDER Height & Weight     Self, Time, Situation, Place    Continent Weight: 113.2 kg Height:  6' 3.5" (191.8 cm)  BEHAVIORAL SYMPTOMS/MOOD NEUROLOGICAL BOWEL NUTRITION STATUS      Continent  Diet (Regular)  AMBULATORY STATUS COMMUNICATION OF NEEDS Skin   Limited Assist Verbally Skin abrasions (L buttock-vaseline)                       Personal Care Assistance Level of Assistance  Bathing, Feeding, Dressing Bathing Assistance: Limited assistance Feeding assistance: Limited assistance Dressing Assistance: Limited assistance     Functional Limitations Info  Sight, Hearing, Speech Sight Info: Impaired (readers) Hearing Info: Impaired (Bilateral Hearing aids) Speech Info: Impaired    SPECIAL CARE FACTORS FREQUENCY  PT (By licensed PT), OT (By licensed OT)     PT Frequency:  (5x week) OT Frequency:  (5x week)            Contractures Contractures Info: Not present    Additional Factors Info  Code Status, Allergies, Psychotropic Code Status Info:  (DNR) Allergies Info:  (Short Ragweed Pollen) Psychotropic Info:  (Wellbutrin/Klonopin)         Current Medications (03/24/2022):  This is the current hospital active medication list Current Facility-Administered Medications  Medication Dose Route Frequency Provider Last Rate Last Admin   0.9 %  sodium chloride infusion   Intravenous Once Brunetta Genera, MD       acetaminophen (TYLENOL) tablet 650 mg  650 mg Oral Q6H PRN Kristopher Oppenheim, DO       Or   acetaminophen (TYLENOL) suppository 650 mg  650 mg Rectal Q6H PRN Kristopher Oppenheim, DO       alfuzosin (UROXATRAL) 24 hr tablet 10 mg  10 mg Oral QHS Oswald Hillock, MD   10 mg at 03/23/22 2202   atorvastatin (LIPITOR) tablet 80 mg  80 mg Oral Daily Oswald Hillock, MD   80 mg at 03/24/22 0951   buPROPion (WELLBUTRIN XL) 24 hr tablet 300 mg  300 mg Oral Daily Oswald Hillock, MD   300 mg at 03/24/22 1610   Chlorhexidine Gluconate Cloth 2 % PADS 6 each  6 each Topical Daily Oswald Hillock, MD   6 each at 03/23/22 1015   clonazePAM (KLONOPIN) tablet 1 mg  1 mg Oral Q6H PRN Oswald Hillock, MD       darifenacin (ENABLEX) 24 hr tablet 15 mg  15 mg Oral Daily Oswald Hillock, MD   15  mg at 03/24/22 0951   dexamethasone (DECADRON) tablet 20 mg  20 mg Oral Daily Brunetta Genera, MD   20 mg at 03/24/22 0951   diltiazem (CARDIZEM CD) 24 hr capsule 240 mg  240 mg Oral Daily Oswald Hillock, MD   240 mg at 03/24/22 0952   feeding supplement (ENSURE ENLIVE / ENSURE PLUS) liquid 237 mL  237 mL Oral BID BM Kristopher Oppenheim, DO   237 mL at 03/24/22 9604   isosorbide mononitrate (IMDUR) 24 hr tablet 30 mg  30 mg Oral Daily Oswald Hillock, MD   30 mg at 03/24/22 0951   levothyroxine (SYNTHROID) tablet 100 mcg  100 mcg Oral QAC breakfast Oswald Hillock, MD   100 mcg at 03/24/22 0634   ondansetron (ZOFRAN) tablet 4 mg  4 mg Oral Q6H PRN Kristopher Oppenheim, DO       Or   ondansetron [facility]) injection 4 mg  4 mg  Intravenous Q6H PRN Kristopher Oppenheim, DO       rivaroxaban Alveda Reasons) tablet 10 mg  10 mg Oral Daily Oswald Hillock, MD   10 mg at 03/24/22 0951   sodium chloride flush (NS) 0.9 % injection 10-40 mL  10-40 mL Intracatheter PRN Brunetta Genera, MD         Discharge Medications: Please see discharge summary for a list of discharge medications.  Relevant Imaging Results:  Relevant Lab Results:   Additional Information SSN: [id number];QOHCOB x2/Booster x2  Dessa Phi, RN

## 2022-03-24 NOTE — Hospital Course (Addendum)
The patient is a 76 year old Caucasian male with a past medical history significant for but not limited to B-cell lymphoma, hypertension, BPH, hypothyroidism, proximal atrial fibrillation on anticoagulation with Xarelto, chronic diastolic CHF, history of CKD stage IIIa with a baseline creatinine of 1.5-1.6 as well as other comorbidities who presented to the hospital as a direct admission from the oncology clinic.  Patient was having chills at home for few days and had been increasing having urinary symptoms at home with occasional dysuria.  He does have a history of BPH.  He was seen at oncology clinic with failure to thrive and fatigue and a recent PET scan was done and showed widespread pulmonary involvement of his B-cell lymphoma.  Labs were drawn in clinic and he had a creatinine of 2.17.  He was admitted and initiated on the first cycle of chemotherapy for his B-cell lymphoma with R-GCD on 03/22/2022.  He was a little confused but then his confusion is improved and he is more awake and alert and talkative.  PT OT evaluated and recommending SNF.  He has a history of diffuse large B-cell lymphoma stage III of the left tonsil and he underwent R-CHOP with G-CSF support for 6 cycles starting 06/23/2018 and his 6 cycles of R-CHOP completed on 10/12/2018.  He now has recurrent B-cell lymphoma with left buttock soft tissue masses, pulmonary medicine widespread lymphadenopathy as well as acute on chronic renal insufficiency and creatinine is improved now after IV fluid hydration.  Given that his mentation is improved and PT OT recommending SNF he is medically stable and will need to be scheduled for outpatient gemcitabine and G-CSF.  His labs have improved however he does have slightly abnormal LFTs in the setting of chemotherapy and dexamethasone use that will need to be followed outpatient setting and work-up further if necessary.

## 2022-03-24 NOTE — Progress Notes (Signed)
PROGRESS NOTE    [name]  CVE:[id number] DOB: 12/13/1945 DOA: 03/19/2022 PCP: Cassandria Anger, MD   Brief Narrative:  The patient is a 76 year old Caucasian male with a past medical history significant for but not limited to B-cell lymphoma, hypertension, BPH, hypothyroidism, proximal atrial fibrillation on anticoagulation with Xarelto, chronic diastolic CHF, history of CKD stage IIIa with a baseline creatinine of 1.5-1.6 as well as other comorbidities who presented to the hospital as a direct admission from the oncology clinic.  Patient was having chills at home for few days and had been increasing having urinary symptoms at home with occasional dysuria.  He does have a history of BPH.  He was seen at oncology clinic with failure to thrive and fatigue and a recent PET scan was done and showed widespread pulmonary involvement of his B-cell lymphoma.  Labs were drawn in clinic and he had a creatinine of 2.17.  He was admitted and initiated on the first cycle of chemotherapy for his B-cell lymphoma with R-GCD on 03/22/2022.  He was a little confused but then his confusion is improved and he is more awake and alert and talkative.  PT OT evaluated and recommending SNF.  He has a history of diffuse large B-cell lymphoma stage III of the left tonsil and he underwent R-CHOP with G-CSF support for 6 cycles starting 06/23/2018 and his 6 cycles of R-CHOP completed on 10/12/2018.  He now has recurrent B-cell lymphoma with left buttock soft tissue masses, pulmonary medicine widespread lymphadenopathy as well as acute on chronic renal insufficiency and creatinine is improved now after IV fluid hydration.  Given that his mentation is improved and PT OT recommending SNF he is medically stable and will need to be scheduled for outpatient gemcitabine and G-CSF as an outpatient.   Assessment and Plan:  History of B-cell lymphoma with Recurrent Large B-Cell Lymphoma with left buttock soft tissue mass, pulmonary  mass and was per lymphadenopathy -Started on chemotherapy per oncology -Oncology feels that he will need continued outpatient chemotherapy and he has been scheduled for cycle 1 day 8 of gemcitabine and G-CSF as an outpatient -Continue with dexamethasone 12 mg p.o. daily for 3 doses -PT OT recommending SNF  Acute kidney injury on CKD stage IIIa -Bladder scan showed 107 mL in the bladder. -Started on gentle IV normal saline at 100 mL/h and this is now stopped -Patient's BUN/Cr went from 41/2.10 -> 37/1.15 -HCTZ and ARB on hold -Continue to avoid nephrotoxic medications, contrast dyes, hypotension and dehydration to ensure adequate renal perfusion and renally dose medications -Repeat CMP in a.m.   Multiple falls at home -As per patient husband at bedside, patient had about 5 falls at home and 24 hours prior to coming to the ED -Patient denies hitting his head or losing consciousness -He was on Xarelto which was on hold at the time of admission -CT head obtained yesterday given worsened confusion was negative for any acute bleed -Was resumed on Xarelto on 03/23/22  Hyperbilirubinemia -Patient's T Bili went from 1.8 -> 1.1 -Continue to Monitor and Trend and Repeat CMP in the AM   Hypoalbuminemia -Patient's Albumin Level went from 3.1 -> 3.0 -Continue to Monitor and Trend -Repeat CMP in the AM   Thrombocytopenia -Patient's Platelet Count went from 163 -> 142 -Continue to Monitor of S/Sx of Bleeding -No overt Bleeding noted -Repeat CBC in the AM     ?  UTI -Urine culture and blood culture obtained. -Patient was Started on empiric  cefepime and sinceUrine culture is negative -Will discontinue Cefepime  Depression and anxiety -New with bupropion 300 g p.o. daily as well as clonazepam 1 mg p.o. every 6 as needed for anxiety   Paroxysmal atrial fibrillation -Stable -Patient was taking Cardizem CD 240 mg daily at home and this has been resumed -Continue Rivaroxaban 10 mg po Daily as  above   BPH -Bladder scan only showed 107 ml -Continue Alfuzosin 10 mg p.o. nightly as well as Darifenacin 15 mg p.o. daily 10 mg daily   Chronic diastolic heart failure -Euvolemic -Continue to monitor for signs and symptoms of volume overload -His losartan and his hydrochlorothiazide has been on hold -Continue with his isosorbide mononitrate 30 mg p.o. daily   Diffuse large B-cell lymphoma -Followed by hematology oncology -Undergoing chemotherapy as above   COPD -Stable -On room air   Hypertension -Blood pressure is stable -Losartan and HCTZ on hold due to acute kidney injury -Continue Diltiazem 240 mg po daily and Isosorbide Mononitrate 30 mg po Daily    Hypothyroidism -Continue Levothyroxine 100 mcg po Daily    Obesity -Complicates overall prognosis and care -Estimated body mass index is 30.78 kg/m as calculated from the following:   Height as of this encounter: 6' 3.5" (1.918 m).   Weight as of this encounter: 113.2 kg.  -Weight Loss and Dietary Counseling given  GOC -DNR  DVT prophylaxis: rivaroxaban (XARELTO) tablet 10 mg Start: 03/22/22 1400 SCDs Start: 03/19/22 2059 rivaroxaban (XARELTO) tablet 10 mg    Code Status: DNR Family Communication: Discussed with husband at bedside  Disposition Plan:  Level of care: Med-Surg Status is: Inpatient Remains inpatient appropriate because: Needs SNF placement and insurance authorization   Consultants:  Medical Oncology  Procedures:  Chemotherapy CT Scan  Antimicrobials:  Anti-infectives (From admission, onward)    Start     Dose/Rate Route Frequency Ordered Stop   03/21/22 1000  ceFEPIme (MAXIPIME) 2 g in sodium chloride 0.9 % 100 mL IVPB  Status:  Discontinued        2 g 200 mL/hr over 30 Minutes Intravenous Every 8 hours 03/21/22 0930 03/22/22 1512   03/19/22 2215  ceFEPIme (MAXIPIME) 2 g in sodium chloride 0.9 % 100 mL IVPB  Status:  Discontinued        2 g 200 mL/hr over 30 Minutes Intravenous Every 12  hours 03/19/22 2127 03/21/22 0930       Subjective: Seen and examined at bedside and is much more awake and alert and talkative.  Per his husband he seems so much better.  Patient still feels weak.  Denies any lightheadedness or dizziness.  No other concerns or close this time and understand that he will need chemotherapy and needs to go to SNF given his extreme weakness.  Objective: Vitals:   03/23/22 1220 03/23/22 2047 03/24/22 0500 03/24/22 0633  BP: 122/67 139/81  124/90  Pulse: (!) 110 (!) 104  (!) 106  Resp: '16 20  20  '$ Temp: 97.9 F (36.6 C) 97.7 F (36.5 C)  98.4 F (36.9 C)  TempSrc: Oral Oral    SpO2: 94% 98%  99%  Weight:   113.2 kg   Height:        Intake/Output Summary (Last 24 hours) at 03/24/2022 1202 Last data filed at 03/23/2022 2045 Gross per 24 hour  Intake 360 ml  Output 500 ml  Net -140 ml   Filed Weights   03/22/22 0500 03/23/22 0500 03/24/22 0500  Weight: 114.3 kg 114.2 kg  113.2 kg   Examination: Physical Exam:  Constitutional: WN/WD obese elderly chronically ill-appearing Caucasian male currently no acute distress Respiratory: Diminished to auscultation bilaterally with coarse breath sounds, no wheezing, rales, rhonchi or crackles. Normal respiratory effort and patient is not tachypenic. No accessory muscle use.  Unlabored breathing Cardiovascular: RRR, no murmurs / rubs / gallops. S1 and S2 auscultated.  Abdomen: Soft, non-tender, distended secondary body habitus. Bowel sounds positive.  GU: Deferred. Musculoskeletal: No clubbing / cyanosis of digits/nails. No joint deformity upper and lower extremities.  Neurologic: CN 2-12 grossly intact with no focal deficits. Romberg sign and cerebellar reflexes not assessed.  Psychiatric: Normal judgment and insight. Alert and oriented x 3. Normal mood and appropriate affect.   Data Reviewed: I have personally reviewed following labs and imaging studies  CBC: Recent Labs  Lab 03/19/22 1233 03/20/22 0547  03/21/22 0608 03/24/22 0853  WBC 8.4 7.0 6.0 8.9  NEUTROABS 5.7 4.9  --  8.3*  HGB 16.7 15.4 15.6 15.0  HCT 48.1 45.6 46.6 44.1  MCV 91.6 93.8 93.6 93.2  PLT 173 143* 142* 841*   Basic Metabolic Panel: Recent Labs  Lab 03/19/22 1233 03/20/22 0547 03/21/22 0608 03/22/22 0502 03/24/22 0853  NA 135 138 138 138 138  K 4.1 3.8 3.7 3.5 4.4  CL 99 104 103 106 105  CO2 '24 25 26 24 25  '$ GLUCOSE 111* 108* 101* 107* 147*  BUN 38* 41* 30* 25* 37*  CREATININE 2.17* 2.10* 1.32* 1.21 1.15  CALCIUM 10.5* 10.1 9.6 9.1 9.0  MG  --  1.8  --   --  1.9  PHOS  --   --   --   --  3.1   GFR: Estimated Creatinine Clearance: 74.7 mL/min (by C-G formula based on SCr of 1.15 mg/dL). Liver Function Tests: Recent Labs  Lab 03/19/22 1233 03/20/22 0547 03/24/22 0853  AST 41 39 36  ALT 32 29 35  ALKPHOS 74 64 57  BILITOT 1.4* 1.8* 1.1  PROT 6.7 6.1* 6.1*  ALBUMIN 3.7 3.1* 3.0*   No results for input(s): "LIPASE", "AMYLASE" in the last 168 hours. No results for input(s): "AMMONIA" in the last 168 hours. Coagulation Profile: No results for input(s): "INR", "PROTIME" in the last 168 hours. Cardiac Enzymes: No results for input(s): "CKTOTAL", "CKMB", "CKMBINDEX", "TROPONINI" in the last 168 hours. BNP (last 3 results) No results for input(s): "PROBNP" in the last 8760 hours. HbA1C: No results for input(s): "HGBA1C" in the last 72 hours. CBG: No results for input(s): "GLUCAP" in the last 168 hours. Lipid Profile: No results for input(s): "CHOL", "HDL", "LDLCALC", "TRIG", "CHOLHDL", "LDLDIRECT" in the last 72 hours. Thyroid Function Tests: No results for input(s): "TSH", "T4TOTAL", "FREET4", "T3FREE", "THYROIDAB" in the last 72 hours. Anemia Panel: No results for input(s): "VITAMINB12", "FOLATE", "FERRITIN", "TIBC", "IRON", "RETICCTPCT" in the last 72 hours. Sepsis Labs: No results for input(s): "PROCALCITON", "LATICACIDVEN" in the last 168 hours.  Recent Results (from the past 240 hour(s))   Culture, blood (Routine X 2) w Reflex to ID Panel     Status: None (Preliminary result)   Collection Time: 03/19/22 10:20 PM   Specimen: BLOOD  Result Value Ref Range Status   Specimen Description   Final    BLOOD RIGHT ANTECUBITAL Performed at [facility] [address], [place], [place] [address]    Special Requests   Final    BOTTLES DRAWN AEROBIC AND ANAEROBIC Blood Culture adequate volume Performed at [facility],  [place] [address], [place], [place] [address]    Culture   Final    NO GROWTH 4 DAYS Performed at [facility], [place] [address] [place], [place] [address]    Report Status PENDING  Incomplete  Culture, blood (Routine X 2) w Reflex to ID Panel     Status: None (Preliminary result)   Collection Time: 03/19/22 10:21 PM   Specimen: BLOOD  Result Value Ref Range Status   Specimen Description   Final    BLOOD LEFT ANTECUBITAL Performed at [facility] [address], [place], [place] [address]    Special Requests   Final    BOTTLES DRAWN AEROBIC ONLY Blood Culture adequate volume Performed at [facility] [address] [place], [place] [address]    Culture   Final    NO GROWTH 4 DAYS Performed at [facility], [place] [address], [place], [place] [address]    Report Status PENDING  Incomplete  Urine Culture     Status: None   Collection Time: 03/20/22 12:55 AM   Specimen: In/Out Cath Urine  Result Value Ref Range Status   Specimen Description   Final    IN/OUT CATH URINE Performed at [facility] [address] [place], [place] [address]    Special Requests   Final    Immunocompromised Performed at [facility], [place] [address] [place], [place] [address]    Culture   Final    NO GROWTH Performed at [facility], [place] [address] [place],  [address]    Report Status 03/21/2022 FINAL  Final    Radiology Studies: No  results found.  Scheduled Meds:  alfuzosin  10 mg Oral QHS   atorvastatin  80 mg Oral Daily   buPROPion  300 mg Oral Daily   Chlorhexidine Gluconate Cloth  6 each Topical Daily   darifenacin  15 mg Oral Daily   dexamethasone  20 mg Oral Daily   diltiazem  240 mg Oral Daily   feeding supplement  237 mL Oral BID BM   isosorbide mononitrate  30 mg Oral Daily   levothyroxine  100 mcg Oral QAC breakfast   rivaroxaban  10 mg Oral Daily   Continuous Infusions:  sodium chloride      LOS: 4 days   Raiford Noble, DO [facility] Available via Epic secure chat 7am-7pm After these hours, please refer to coverage provider listed on [email or web address] 03/24/2022, 12:02 PM

## 2022-03-24 NOTE — TOC Initial Note (Signed)
Transition of Care [facility]) - Initial/Assessment Note    Patient Details  Name: [name] MRN: [id number] Date of Birth: [birth year]  Transition of [facility]) CM/SW Contact:    Dessa Phi, RN Phone Number: 03/24/2022, 3:03 PM  Clinical NarrativePatient/spouse agree to SNF-faxed out await bed offers. [facility].                  Expected Discharge Plan: Skilled Nursing Facility Barriers to Discharge: Continued Medical Work up   Patient Goals and CMS Choice Patient states their goals for this hospitalization and ongoing recovery are:: Rehab CMS Medicare.gov Compare Post Acute Care list provided to:: Patient Choice offered to / list presented to : Patient  Expected Discharge Plan and Services Expected Discharge Plan: [place]   Discharge Planning Services: CM Consult Post Acute Care Choice: [place] Living arrangements for the past 2 months: Single Family Home                                      Prior Living Arrangements/Services Living arrangements for the past 2 months: Single Family Home Lives with:: Spouse Patient language and need for interpreter reviewed:: Yes Do you feel safe going back to the place where you live?: Yes      Need for Family Participation in Patient Care: Yes (Comment) Care giver support system in place?: Yes (comment)   Criminal Activity/Legal Involvement Pertinent to Current Situation/Hospitalization: No - Comment as needed  Activities of Daily Living Home Assistive Devices/Equipment: Environmental consultant (specify type), Shower chair with back ADL Screening (condition at time of admission) Patient's cognitive ability adequate to safely complete daily activities?: Yes Is the patient deaf or have difficulty hearing?: Yes Does the patient have difficulty seeing, even when wearing glasses/contacts?: Yes (reading glasses) Does the patient have difficulty concentrating, remembering, or making  decisions?: No Patient able to express need for assistance with ADLs?: Yes Does the patient have difficulty dressing or bathing?: No Independently performs ADLs?: Yes (appropriate for developmental age) Does the patient have difficulty walking or climbing stairs?: Yes Weakness of Legs: Both Weakness of Arms/Hands: Both  Permission Sought/Granted Permission sought to share information with : Case Manager Permission granted to share information with : Yes, Verbal Permission Granted  Share Information with NAME: Case Manager           Emotional Assessment Appearance:: Appears stated age Attitude/Demeanor/Rapport: Gracious Affect (typically observed): Accepting Orientation: : Oriented to Self, Oriented to Place, Oriented to  Time, Oriented to Situation Alcohol / Substance Use: Not Applicable Psych Involvement: No (comment)  Admission diagnosis:  AKI (acute kidney injury) ([place]) [N17.9] Patient Active Problem List   Diagnosis Date Noted   Encounter for antineoplastic chemotherapy    AKI (acute kidney injury) ([place]) 03/19/2022   DNR (do not resuscitate)/DNI(Do Not Intubate) 03/19/2022   Lung mass 03/06/2022   Stage 3a chronic kidney disease (CKD) (HCC) - Baseline Scr 1.5 - 1.6 03/06/2022   Allergic rhinitis 01/25/2022   Allergic rhinitis due to animal (cat) (dog) hair and dander 01/25/2022   Allergic rhinitis due to pollen 01/25/2022   Mild intermittent asthma 01/25/2022   Vasomotor rhinitis 01/25/2022   Multiple skin nodules 01/25/2022   Weight loss 01/25/2022   Pure hypercholesterolemia 02/10/2021   Chronic diastolic heart failure ([place]) 06/21/2019   H/O nephrolithotomy with removal of calculi 03/26/2019   Renal calculus 03/26/2019   History of chemotherapy 11/21/2018  Lymphoma in remission ([place]) 11/21/2018   Generalized weakness 11/08/2018   Well adult exam 10/02/2018   Counseling regarding advance care planning and goals of care 07/09/2018   Diffuse large B-cell lymphoma  ([place]) 06/23/2018   Port-A-Cath in place 06/23/2018   Hypogonadism in male 11/22/2017   Nephrolithiasis 10/24/2017   Recurrent dislocation of left patella 07/15/2017   Recurrent subluxation of patella, left 07/08/2017   At high risk for falls 05/17/2017   S/P revision of total knee 04/11/2017   Failed total knee, left, initial encounter ([place]) 04/09/2017   Renal calculus, right 02/08/2017   Protein-calorie malnutrition, severe ([place]) 12/08/2016   Chronic obstructive pulmonary disease ([place]) 12/08/2016   Staghorn renal calculus 11/05/2016   Senile osteoporosis 06/08/2016   Dyslipidemia 01/12/2016   Insomnia secondary to depression with anxiety 12/02/2015   Generalized anxiety disorder 11/19/2015   Wound drainage right hip 11/15/2013   Expected blood loss anemia 09/18/2013   S/P right TH revision 09/17/2013   BPH with obstruction/lower urinary tract symptoms 08/03/2013   History of gastroesophageal reflux (GERD) 08/03/2013   OSA (obstructive sleep apnea) 08/03/2013   Paroxysmal atrial fibrillation ([place]) 08/03/2013   Presence of unspecified artificial hip joint 08/03/2013   Nonunion, fracture, Right tibia  07/03/2013   Chronic anticoagulation 02/21/2013   Fall 02/08/2013   Physical deconditioning 02/08/2013   Low back pain radiating to both legs 02/08/2013   Compression fracture of L1 lumbar vertebra ([place]) 02/08/2013   Morbid obesity ([place]) 02/05/2013   Anemia 10/06/2012   Essential hypertension 10/03/2012   Psoriasis 10/03/2012   Vitamin D deficiency 10/03/2012   GERD (gastroesophageal reflux disease) 10/03/2012   Anxiety 10/03/2012   Depression 10/03/2012   CAD (coronary artery disease) 10/03/2012   DJD (degenerative joint disease) 10/03/2012   Chronic gingivitis 10/03/2012   Low testosterone, possible hypogonadism 10/01/2012   Normocytic anemia 09/30/2012   Longstanding persistent atrial fibrillation ([place]) 09/29/2012   Hypothyroidism 09/29/2012   History of recurrent UTIs  09/29/2012   PCP:  Cassandria Anger, MD Pharmacy:   [id number] [place], [place]. [place] [address] Phone: [phone] Fax: [place], [place] [address] Phone: [phone] Fax: [phone]  [facility] [place], [place] [address] Phone: [phone] Fax: [phone]     Social Determinants of Health (SDOH) Interventions    Readmission Risk Interventions    03/22/2022   10:07 AM  Readmission Risk Prevention Plan  Transportation Screening Complete  PCP or Specialist Appt within 3-5 Days Complete  HRI or [place] Complete  Social Work Consult for [place] Planning/Counseling Complete  Palliative Care Screening Complete  Medication Review Press photographer) Complete

## 2022-03-25 DIAGNOSIS — N401 Enlarged prostate with lower urinary tract symptoms: Secondary | ICD-10-CM | POA: Diagnosis not present

## 2022-03-25 DIAGNOSIS — I5032 Chronic diastolic (congestive) heart failure: Secondary | ICD-10-CM | POA: Diagnosis not present

## 2022-03-25 DIAGNOSIS — J449 Chronic obstructive pulmonary disease, unspecified: Secondary | ICD-10-CM | POA: Diagnosis not present

## 2022-03-25 DIAGNOSIS — N179 Acute kidney failure, unspecified: Secondary | ICD-10-CM | POA: Diagnosis not present

## 2022-03-25 LAB — CULTURE, BLOOD (ROUTINE X 2)
Culture: NO GROWTH
Culture: NO GROWTH
Special Requests: ADEQUATE
Special Requests: ADEQUATE

## 2022-03-25 LAB — CBC WITH DIFFERENTIAL/PLATELET
Abs Immature Granulocytes: 0.05 10*3/uL (ref 0.00–0.07)
Basophils Absolute: 0 10*3/uL (ref 0.0–0.1)
Basophils Relative: 0 %
Eosinophils Absolute: 0 10*3/uL (ref 0.0–0.5)
Eosinophils Relative: 0 %
HCT: 43.4 % (ref 39.0–52.0)
Hemoglobin: 14.7 g/dL (ref 13.0–17.0)
Immature Granulocytes: 1 %
Lymphocytes Relative: 5 %
Lymphs Abs: 0.4 10*3/uL — ABNORMAL LOW (ref 0.7–4.0)
MCH: 31.7 pg (ref 26.0–34.0)
MCHC: 33.9 g/dL (ref 30.0–36.0)
MCV: 93.5 fL (ref 80.0–100.0)
Monocytes Absolute: 0.1 10*3/uL (ref 0.1–1.0)
Monocytes Relative: 1 %
Neutro Abs: 7.6 10*3/uL (ref 1.7–7.7)
Neutrophils Relative %: 93 %
Platelets: 148 10*3/uL — ABNORMAL LOW (ref 150–400)
RBC: 4.64 MIL/uL (ref 4.22–5.81)
RDW: 15.8 % — ABNORMAL HIGH (ref 11.5–15.5)
WBC: 8.1 10*3/uL (ref 4.0–10.5)
nRBC: 0 % (ref 0.0–0.2)

## 2022-03-25 LAB — COMPREHENSIVE METABOLIC PANEL
ALT: 57 U/L — ABNORMAL HIGH (ref 0–44)
AST: 59 U/L — ABNORMAL HIGH (ref 15–41)
Albumin: 2.9 g/dL — ABNORMAL LOW (ref 3.5–5.0)
Alkaline Phosphatase: 51 U/L (ref 38–126)
Anion gap: 7 (ref 5–15)
BUN: 47 mg/dL — ABNORMAL HIGH (ref 8–23)
CO2: 26 mmol/L (ref 22–32)
Calcium: 9.1 mg/dL (ref 8.9–10.3)
Chloride: 105 mmol/L (ref 98–111)
Creatinine, Ser: 1.1 mg/dL (ref 0.61–1.24)
GFR, Estimated: >60 mL/min (ref >60)
Glucose, Bld: 146 mg/dL — ABNORMAL HIGH (ref 70–99)
Potassium: 4.7 mmol/L (ref 3.5–5.1)
Sodium: 138 mmol/L (ref 135–145)
Total Bilirubin: 1 mg/dL (ref 0.3–1.2)
Total Protein: 6 g/dL — ABNORMAL LOW (ref 6.5–8.1)

## 2022-03-25 LAB — PHOSPHORUS: Phosphorus: 2.9 mg/dL (ref 2.5–4.6)

## 2022-03-25 LAB — MAGNESIUM: Magnesium: 1.8 mg/dL (ref 1.7–2.4)

## 2022-03-25 MED ORDER — HYDROCODONE-ACETAMINOPHEN 7.5-325 MG PO TABS
1.0000 | ORAL_TABLET | Freq: Four times a day (QID) | ORAL | 0 refills | Status: DC | PRN
Start: 2022-03-25 — End: 2022-03-26

## 2022-03-25 MED ORDER — ENSURE ENLIVE PO LIQD: 237.0000 mL | Freq: Two times a day (BID) | ORAL | 12 refills | Status: DC

## 2022-03-25 MED ORDER — CLONAZEPAM 1 MG PO TABS: 1.0000 mg | ORAL_TABLET | Freq: Four times a day (QID) | ORAL | 0 refills | Status: DC | PRN

## 2022-03-25 MED ORDER — MAGNESIUM SULFATE 2 GM/50ML IV SOLN
2.0000 g | Freq: Once | INTRAVENOUS | Status: AC
  Administered 2022-03-25: 2 g via INTRAVENOUS
  Filled 2022-03-25: qty 50

## 2022-03-25 MED ORDER — ACETAMINOPHEN 325 MG PO TABS: 650.0000 mg | ORAL_TABLET | Freq: Four times a day (QID) | ORAL | Status: DC | PRN

## 2022-03-25 NOTE — Consult Note (Signed)
[facility] Inpatient Consult  03/25/2022  JESSUP OGAS Sep 03, 1946 [id number]  [place] Management [facility] CM)   Patient chart has been reviewed with noted high risk score for unplanned readmissions and unplanned 30 day readmission.  Patient assessed for community [facility] Management follow up needs. Per review, current recommendation is for SNF. No [facility] CM needs.   Of note, [facility] Care Management services does not replace or interfere with any services that are arranged by inpatient case management or social work.    Netta Cedars, MSN, RN [place] [facility] Liaison Toll free office [phone]

## 2022-03-25 NOTE — Plan of Care (Signed)

## 2022-03-25 NOTE — Progress Notes (Signed)
Physical Therapy Treatment Patient Details Name: [name] MRN: [id number] DOB: [birth year] Today's Date: 03/25/2022   History of Present Illness 76 year old male presented to hospital as a direct admission from oncology clinic, admitted with AKI.  Recent PET scan showed widespread pulmonary involvement of his B-cell lymphoma.  PMH: B-cell lymphoma, hypertension, BPH, hypothyroidism, paroxysmal atrial fibrillation on Xarelto, chronic diastolic heart failure, CKD stage III    PT Comments    Pt AXO x 3 very pleasant.  Feeling "really tired" this afternoon.  Pt OOB in recliner with Spouse at bedside.  Assisted with amb in hallway was difficult and limited.  General Gait Details: VERY unsteady gait with decreased amb distance due to increased c/o fatigue/weakness most likely from [facility].  Pt present with ataxia, unsteady short shuffled steps.  50% VC's on proper walker to self distance as pt tends to push too far front.  "my knees give out", pt present with B knee buckle as during sit to stand and stand to sit.  Increased tremors throughout with fatigue.  VERY unsteady with turns.  also present with 3/4 dyspnea. Pt present with deconditioning and gait instability even with use of the walker.  Pt is a HIGH FALL.  Pt will need ST Rehab at SNF prior to safely returning home with Spouse.   Recommendations for follow up therapy are one component of a multi-disciplinary discharge planning process, led by the attending physician.  Recommendations may be updated based on patient status, additional functional criteria and insurance authorization.  Follow Up Recommendations  Skilled nursing-short term rehab (<3 hours/day)     Assistance Recommended at Discharge Intermittent Supervision/Assistance  Patient can return home with the following Assist for transportation;Help with stairs or ramp for entrance;Assistance with cooking/housework;A little help with walking and/or transfers;A little help with  bathing/dressing/bathroom   Equipment Recommendations  None recommended by PT    Recommendations for Other Services       Precautions / Restrictions Precautions Precautions: Fall Restrictions Weight Bearing Restrictions: No     Mobility  Bed Mobility               General bed mobility comments: OOB in recliner    Transfers Overall transfer level: Needs assistance Equipment used: Rolling walker (2 wheels) Transfers: Sit to/from Stand Sit to Stand: Mod assist           General transfer comment: required increased assist this session due to increased c/o weakness/fatigue from CHEMO Tx.    Ambulation/Gait Ambulation/Gait assistance: Mod assist, Max assist Gait Distance (Feet): 22 Feet Assistive device: Rolling walker (2 wheels) Gait Pattern/deviations: Narrow base of support, Step-through pattern, Decreased stride length Gait velocity: decreased     General Gait Details: VERY unsteady gait with decreased amb distance due to increased c/o fatigue/weakness most likely from CHEMO.  Pt present with ataxia, unsteady short shuffled steps.  50% VC's on proper walker to self distance as pt tends to push too far front.  "my knees give out", pt present with B knee buckle as during sit to stand and stand to sit.  Increased tremors throughout with fatigue.  VERY unsteady with turns.  also present with 3/4 dyspnea.   Stairs             Wheelchair Mobility    Modified Rankin (Stroke Patients Only)       Balance Overall balance assessment: Needs assistance           Standing balance-Leahy Scale: Poor Standing balance comment: attempted "marching"  however unable due to opposite knee buckle               High Level Balance Comments: performed backward and lateral gait limited distance as B knees tends to buckle.            Cognition Arousal/Alertness: Awake/alert Behavior During Therapy: WFL for tasks assessed/performed Overall Cognitive Status:  Within Functional Limits for tasks assessed                                 General Comments: AxO x 3 very pleasant and willing but feeling really weak        Exercises      General Comments        Pertinent Vitals/Pain Pain Assessment Pain Assessment: Faces Faces Pain Scale: Hurts a little bit Pain Location: general Pain Descriptors / Indicators: Aching Pain Intervention(s): Monitored during session    Home Living                          Prior Function            PT Goals (current goals can now be found in the care plan section) Progress towards PT goals: Progressing toward goals    Frequency    Min 3X/week      PT Plan Current plan remains appropriate    Co-evaluation              AM-PAC PT "6 Clicks" Mobility   Outcome Measure  Help needed turning from your back to your side while in a flat bed without using bedrails?: A Lot Help needed moving from lying on your back to sitting on the side of a flat bed without using bedrails?: A Lot Help needed moving to and from a bed to a chair (including a wheelchair)?: A Lot Help needed standing up from a chair using your arms (e.g., wheelchair or bedside chair)?: A Lot Help needed to walk in hospital room?: A Lot Help needed climbing 3-5 steps with a railing? : Total 6 Click Score: 11    End of Session Equipment Utilized During Treatment: Gait belt Activity Tolerance: Patient limited by fatigue Patient left: in chair;with call bell/phone within reach;with family/visitor present Nurse Communication: Mobility status PT Visit Diagnosis: Other abnormalities of gait and mobility (R26.89);Difficulty in walking, not elsewhere classified (R26.2)     Time: 5643-3295 PT Time Calculation (min) (ACUTE ONLY): 13 min  Charges:  $Gait Training: 8-22 mins                       Rica Koyanagi  PTA Acute  Rehabilitation Services Pager      [phone] Office      [phone]

## 2022-03-25 NOTE — TOC Progression Note (Signed)
Transition of Care [facility]) - Progression Note   Patient Details  Name: [name] MRN: [id number] Date of Birth: 1946/08/14  Transition [facility] Care [facility]) CM/SW [place], LCSW Phone Number: 03/25/2022, 2:31 PM  Clinical Narrative: CSW reviewed bed offers with patient's husband and confirmed [facility] was one of the patient's first choices. Husband confirmed he will be able to transport the patient to his chemo appointments from the facility. CSW confirmed bed with [facility] at [facility], which will be available tomorrow. CSW completed insurance authorization on the [employer] portal. Reference ID # is: [id number]. Patient has been approved for 03/26/2022-03/30/2022.  Expected Discharge Plan: [place] Barriers to Discharge: Continued Medical Work up  Expected Discharge Plan and Services Expected Discharge Plan: [place] Discharge Planning Services: CM Consult Post Acute Care Choice: [place] Living arrangements for the past 2 months: Single Family Home Expected Discharge Date: 03/25/22                Readmission Risk Interventions    03/22/2022   10:07 AM  Readmission Risk Prevention Plan  Transportation Screening Complete  PCP or Specialist Appt within 3-5 Days Complete  HRI or [place] Complete  Social Work Consult for [place] Planning/Counseling Complete  Palliative Care Screening Complete  Medication Review Press photographer) Complete

## 2022-03-25 NOTE — Evaluation (Signed)
Occupational Therapy Evaluation Patient Details Name: [name] MRN: [id number] DOB: 1946-06-30 Today's Date: 03/25/2022   History of Present Illness 76 year old male presented to hospital as a direct admission from oncology clinic, admitted with AKI.  Recent PET scan showed widespread pulmonary involvement of his B-cell lymphoma.  PMH: B-cell lymphoma, hypertension, BPH, hypothyroidism, paroxysmal atrial fibrillation on Xarelto, chronic diastolic heart failure, CKD stage III   Clinical Impression   [name] is a 76 year old man who presents with impaired balance, generalized weakness and decreased activity tolerance. Patient is typically independent with ADLs and ambulation. He reports frequent falls recently. On evaluation he exhibited good upper body strength and ability to perform ADLs with min guard when needing to stand. He had one major loss of balance with in room ambulation that therapist had to correct to stop a fall and verbal cues for safety. He walks and turns too quickly. He is breathing heavy with just ambulation to bathroom and back to be seated in recliner. Patient will benefit from skilled OT services while in hospital to improve deficits and learn compensatory strategies as needed in order to return to PLOF.  Patient will benefit from short term rehab to maximize physical abilities and improve safety prior to return home.     Recommendations for follow up therapy are one component of a multi-disciplinary discharge planning process, led by the attending physician.  Recommendations may be updated based on patient status, additional functional criteria and insurance authorization.   Follow Up Recommendations  Skilled nursing-short term rehab (<3 hours/day)    Assistance Recommended at Discharge Frequent or constant Supervision/Assistance  Patient can return home with the following A little help with walking and/or transfers;A little help with  bathing/dressing/bathroom;Assistance with cooking/housework;Help with stairs or ramp for entrance    Functional Status Assessment  Patient has had a recent decline in their functional status and demonstrates the ability to make significant improvements in function in a reasonable and predictable amount of time.  Equipment Recommendations  None recommended by OT    Recommendations for Other Services       Precautions / Restrictions Precautions Precautions: Fall Restrictions Weight Bearing Restrictions: No      Mobility Bed Mobility Overal bed mobility: Modified Independent                  Transfers Overall transfer level: Needs assistance Equipment used: Rolling walker (2 wheels) Transfers: Sit to/from Stand Sit to Stand: Min guard           General transfer comment: Min guard to stand with RW from bed, min guard to ambulate to bathroom. Verbal cues for hand placement and safety. Patient had major LOB taking step back to sit in chair that therapist had to correct.      Balance Overall balance assessment: Needs assistance Sitting-balance support: Feet supported, No upper extremity supported Sitting balance-Leahy Scale: Good     Standing balance support: Reliant on assistive device for balance, During functional activity, Bilateral upper extremity supported Standing balance-Leahy Scale: Poor                             ADL either performed or assessed with clinical judgement   ADL Overall ADL's : Needs assistance/impaired Eating/Feeding: Independent   Grooming: Standing;Min guard   Upper Body Bathing: Set up;Sitting   Lower Body Bathing: Sit to/from stand;Min guard   Upper Body Dressing : Set up;Sitting   Lower  Body Dressing: Min guard;Sit to/from stand   Toilet Transfer: Min guard;Rolling walker (2 wheels);Cueing for safety;Grab Environmental education officer and Hygiene: Min guard;Sit to/from stand        Functional mobility during ADLs: Min guard;Rolling walker (2 wheels)       Vision Patient Visual Report: No change from baseline       Perception     Praxis      Pertinent Vitals/Pain Pain Assessment Pain Assessment: No/denies pain     Hand Dominance Left   Extremity/Trunk Assessment Upper Extremity Assessment Upper Extremity Assessment: RUE deficits/detail;LUE deficits/detail RUE Deficits / Details: WFL ROM, 5/5 strength RUE Sensation: WNL RUE Coordination: WNL LUE Deficits / Details: WFL ROm, 5/5 strength LUE Sensation: WNL LUE Coordination: WNL   Lower Extremity Assessment Lower Extremity Assessment: Defer to PT evaluation   Cervical / Trunk Assessment Cervical / Trunk Assessment: Normal   Communication Communication Communication: No difficulties   Cognition Arousal/Alertness: Awake/alert Behavior During Therapy: WFL for tasks assessed/performed Overall Cognitive Status: Within Functional Limits for tasks assessed                                       General Comments       Exercises     Shoulder Instructions      Home Living Family/patient expects to be discharged to:: Private residence Living Arrangements: Spouse/significant other Available Help at Discharge: Available 24 hours/day Type of Home: House Home Access: Stairs to enter Technical brewer of Steps: 4 Entrance Stairs-Rails: Right Home Layout: Two level Alternate Level Stairs-Number of Steps: has chair lift - still needs to get up 4 steps to chair lift       Bathroom Toilet: Handicapped height     Home Equipment: Conservation officer, nature (2 wheels);Cane - single point;Wheelchair - manual          Prior Functioning/Environment               Mobility Comments: amb with RW; reports a fall ~ 1 month ago, reports being unsteady andlosing his balance when he moves too quickly ADLs Comments: independent with ADLs        OT Problem List: Decreased  strength;Decreased range of motion;Decreased activity tolerance;Impaired balance (sitting and/or standing);Decreased knowledge of precautions;Decreased safety awareness      OT Treatment/Interventions: Self-care/ADL training;Therapeutic exercise;DME and/or AE instruction;Therapeutic activities;Balance training;Patient/family education    OT Goals(Current goals can be found in the care plan section) Acute Rehab OT Goals Patient Stated Goal: to get stronger OT Goal Formulation: With patient Time For Goal Achievement: 04/08/22 Potential to Achieve Goals: Good  OT Frequency: Min 2X/week    Co-evaluation              AM-PAC OT "6 Clicks" Daily Activity     Outcome Measure Help from another person eating meals?: None Help from another person taking care of personal grooming?: A Little Help from another person toileting, which includes using toliet, bedpan, or urinal?: A Little Help from another person bathing (including washing, rinsing, drying)?: A Little Help from another person to put on and taking off regular upper body clothing?: A Little Help from another person to put on and taking off regular lower body clothing?: A Little 6 Click Score: 19   End of Session Equipment Utilized During Treatment: Rolling walker (2 wheels) Nurse Communication: Mobility status  Activity Tolerance: Patient tolerated treatment well  Patient left: in chair;with call bell/phone within reach;with family/visitor present  OT Visit Diagnosis: Unsteadiness on feet (R26.81);Muscle weakness (generalized) (M62.81)                Time: 6431-4276 OT Time Calculation (min): 24 min Charges:  OT General Charges $OT Visit: 1 Visit OT Evaluation $OT Eval Low Complexity: 1 Low  Crucita Lacorte, OTR/L [place]  Office [phone] Pager: [place] 03/25/2022, 3:43 PM

## 2022-03-25 NOTE — Discharge Summary (Signed)
Physician Discharge Summary   Patient: [name] MRN: [id number] DOB: Apr 23, 1946  Admit date:     03/19/2022  Discharge date: 03/25/22  Discharge Physician: Raiford Noble, DO    PCP: Cassandria Anger, MD   Recommendations at discharge:   Follow-up with PCP within 1 to 2 weeks and repeat CBC, CMP, mag, Phos within 1 week Follow-up with medical oncology within 1 to 2 weeks for continued systemic chemotherapy  Discharge Diagnoses: Principal Problem:   AKI (acute kidney injury) ([place]) Active Problems:   Hypothyroidism   Essential hypertension   Morbid obesity ([place])   Fall   Chronic anticoagulation   Chronic obstructive pulmonary disease (HCC)   Diffuse large B-cell lymphoma (HCC)   Chronic diastolic heart failure (HCC)   BPH with obstruction/lower urinary tract symptoms   Paroxysmal atrial fibrillation (HCC)   Stage 3a chronic kidney disease (CKD) (HCC) - Baseline Scr 1.5 - 1.6   DNR (do not resuscitate)/DNI(Do Not Intubate)   Encounter for antineoplastic chemotherapy  Resolved Problems:   * No resolved hospital problems. [facility] Course: The patient is a 76 year old Caucasian male with a past medical history significant for but not limited to B-cell lymphoma, hypertension, BPH, hypothyroidism, proximal atrial fibrillation on anticoagulation with Xarelto, chronic diastolic CHF, history of CKD stage IIIa with a baseline creatinine of 1.5-1.6 as well as other comorbidities who presented to the hospital as a direct admission from the oncology clinic.  Patient was having chills at home for few days and had been increasing having urinary symptoms at home with occasional dysuria.  He does have a history of BPH.  He was seen at oncology clinic with failure to thrive and fatigue and a recent PET scan was done and showed widespread pulmonary involvement of his B-cell lymphoma.  Labs were drawn in clinic and he had a creatinine of 2.17.  He was admitted and initiated on the first  cycle of chemotherapy for his B-cell lymphoma with R-GCD on 03/22/2022.  He was a little confused but then his confusion is improved and he is more awake and alert and talkative.  PT OT evaluated and recommending SNF.  He has a history of diffuse large B-cell lymphoma stage III of the left tonsil and he underwent R-CHOP with G-CSF support for 6 cycles starting 06/23/2018 and his 6 cycles of R-CHOP completed on 10/12/2018.  He now has recurrent B-cell lymphoma with left buttock soft tissue masses, pulmonary medicine widespread lymphadenopathy as well as acute on chronic renal insufficiency and creatinine is improved now after IV fluid hydration.  Given that his mentation is improved and PT OT recommending SNF he is medically stable and will need to be scheduled for outpatient gemcitabine and G-CSF.  His labs have improved however he does have slightly abnormal LFTs in the setting of chemotherapy and dexamethasone use that will need to be followed outpatient setting and work-up further if necessary.  Assessment and Plan:  History of B-cell lymphoma with Recurrent Large B-Cell Lymphoma with left buttock soft tissue mass, pulmonary mass and was per lymphadenopathy -Started on chemotherapy per oncology -Oncology feels that he will need continued outpatient chemotherapy and he has been scheduled for cycle 1 day 8 of gemcitabine and G-CSF as an outpatient -Continue with dexamethasone 12 mg p.o. daily for 3 doses and now is being completed  -PT OT recommending SNF patient is medically stable for discharge   Acute kidney injury on CKD stage IIIa -Bladder scan showed 107 mL in the  bladder. -Started on gentle IV normal saline at 100 mL/h and this is now stopped -Patient's BUN/Cr went from 41/2.10 -> 37/1.15 and is now 47/12.10 -HCTZ and ARB on hold but can be resumed now -Continue to avoid nephrotoxic medications, contrast dyes, hypotension and dehydration to ensure adequate renal perfusion and renally dose  medications -Repeat CMP within 1 week   Multiple falls at home -As per patient husband at bedside, patient had about 5 falls at home and 24 hours prior to coming to the ED -Patient denies hitting his head or losing consciousness -He was on Xarelto which was on hold at the time of admission -CT head obtained yesterday given worsened confusion was negative for any acute bleed -Was resumed on Xarelto on 03/23/22   Hyperbilirubinemia -Patient's T Bili went from 1.8 -> 1.1 -> 1.0 -Continue to Monitor and Trend and Repeat CMP in the AM    Hypoalbuminemia -Patient's Albumin Level went from 3.1 -> 3.0 -> 2.9 -Continue to Monitor and Trend -Repeat CMP in the AM    Thrombocytopenia -Patient's Platelet Count went from 163 -> 142 -> 148 -Continue to Monitor of S/Sx of Bleeding -No overt Bleeding noted -Repeat CBC in the AM   Abnormal LFTs -Mildly elevated in the setting of dexamethasone and chemotherapy -AST went from 39 -> 36 -> 59  -ALT went from 29 -> 35 -> 57 -Continue to monitor and trend LFTs in the outpatient setting and if necessary will need an outpatient right upper quadrant ultrasound and an acute hepatitis panel    ?  UTI -Urine culture and blood culture obtained. -Patient was Started on empiric cefepime and sinceUrine culture is negative -Will discontinue Cefepime now   Depression and anxiety -New with bupropion 300 g p.o. daily as well as clonazepam 1 mg p.o. every 6 as needed for anxiety   Paroxysmal atrial fibrillation -Stable -Patient was taking Cardizem CD 240 mg daily at home and this has been resumed -Continue Rivaroxaban 10 mg po Daily as above   BPH -Bladder scan only showed 107 ml -Continue Alfuzosin 10 mg p.o. nightly as well as Darifenacin 15 mg p.o. daily 10 mg daily   Chronic diastolic heart failure -Euvolemic -Continue to monitor for signs and symptoms of volume overload -His losartan and his hydrochlorothiazide has been on hold -Continue with his  isosorbide mononitrate 30 mg p.o. daily -Continue to monitor for signs and symptoms of volume overload   Diffuse large B-cell lymphoma -Followed by hematology oncology -Undergoing chemotherapy as above   COPD -Stable -SpO2: 96 % -On room air   Hypertension -Blood pressure is stable -Losartan and HCTZ on hold due to acute kidney injury but can resume now that BUN/Cr is improved -Continue Diltiazem 240 mg po daily and Isosorbide Mononitrate 30 mg po Daily  -Continue to monitor blood pressures per protocol -Last BP reading is 146/81   Hypothyroidism -Continue Levothyroxine 100 mcg po Daily    Obesity -Complicates overall prognosis and care -Estimated body mass index is 31.13 kg/m as calculated from the following:   Height as of this encounter: 6' 3.5" (1.918 m).   Weight as of this encounter: 114.5 kg.  -Weight Loss and Dietary Counseling given   [place] -DNR  Nutrition Documentation    [facility] Admission (Current) from 03/19/2022 in [facility] [place] [facility]  Nutrition Problem Increased nutrient needs  Etiology acute illness, chronic illness, cancer and cancer related treatments  Nutrition Goal Patient will meet greater than or equal to 90% of  their needs  Interventions Ensure Enlive (each supplement provides 350kcal and 20 grams of protein)      Consultants: Medical oncology Procedures performed: CT of the head Disposition: Skilled nursing facility Diet recommendation:  Cardiac diet  DISCHARGE MEDICATION: Allergies as of 03/25/2022       Reactions   Short Ragweed Pollen Ext Cough        Medication List     STOP taking these medications    mupirocin ointment 2 % Commonly known as: BACTROBAN       TAKE these medications    acetaminophen 325 MG tablet Commonly known as: TYLENOL Take 2 tablets (650 mg total) by mouth every 6 (six) hours as needed for mild pain (or Fever >/= 101). What changed:  medication strength how much to take when to take  this reasons to take this   albuterol 108 (90 Base) MCG/ACT inhaler Commonly known as: VENTOLIN HFA Inhale 2 puffs into the lungs every 6 (six) hours as needed for wheezing or shortness of breath.   alfuzosin 10 MG 24 hr tablet Commonly known as: UROXATRAL TAKE 1 TABLET AT BEDTIME What changed: how to take this   allopurinol 100 MG tablet Commonly known as: ZYLOPRIM Take 1 tablet (100 mg total) by mouth 2 (two) times daily.   amoxicillin 500 MG capsule Commonly known as: AMOXIL Take 2,000 mg by mouth daily as needed (dental appointments).   aspirin EC 81 MG tablet Take 81 mg by mouth daily.   atorvastatin 80 MG tablet Commonly known as: LIPITOR Take 80 mg by mouth daily.   buPROPion 300 MG 24 hr tablet Commonly known as: WELLBUTRIN XL TAKE 1 TABLET EVERY DAY WITH BREAKFAST What changed: See the new instructions.   CALCIUM CITRATE + D PO Take 1 tablet by mouth 2 (two) times daily. '1200mg'$  of Calcium and 1000units vitamin D3   clonazePAM 1 MG tablet Commonly known as: KLONOPIN Take 1 tablet (1 mg total) by mouth every 6 (six) hours as needed for anxiety. Take 1 tablet (1 mg total) by mouth every 6 (six) hours as needed. for anxiety Strength: 1 mg What changed: See the new instructions.   CRANBERRY PO Take 2 tablets by mouth daily.   denosumab 60 MG/ML Sosy injection Commonly known as: PROLIA Inject 60 mg as directed every 6 (six) months. Last injection: Feb 2020 Next injection: August 2020   dexamethasone 4 MG tablet Commonly known as: DECADRON Take 5 tablets (20 mg total) by mouth daily. Start the day after carboplatin chemotherapy for 3 days.   diltiazem 240 MG 24 hr capsule Commonly known as: CARDIZEM CD TAKE 1 CAPSULE EVERY DAY   Docusate Sodium 100 MG capsule Take 100 mg by mouth 2 (two) times daily.   DULoxetine 60 MG capsule Commonly known as: CYMBALTA TAKE 1 CAPSULE TWICE DAILY   EPINEPHrine 0.3 mg/0.3 mL Soaj injection Commonly known as:  EPI-PEN Inject 0.3 mg into the muscle as needed for anaphylaxis.   ezetimibe 10 MG tablet Commonly known as: ZETIA TAKE 1 TABLET EVERY DAY   feeding supplement Liqd Take 237 mLs by mouth 2 (two) times daily between meals.   ferrous sulfate 325 (65 FE) MG tablet Take 325 mg by mouth 2 (two) times daily with a meal.   hydrochlorothiazide 12.5 MG tablet Commonly known as: HYDRODIURIL TAKE 1 TABLET (12.5 MG TOTAL) BY MOUTH DAILY.   HYDROcodone-acetaminophen 7.5-325 MG tablet Commonly known as: NORCO Take 1 tablet by mouth every 6 (six) hours as  needed for severe pain.   isosorbide mononitrate 30 MG 24 hr tablet Commonly known as: IMDUR TAKE 1 TABLET (30 MG TOTAL) BY MOUTH DAILY.   levothyroxine 100 MCG tablet Commonly known as: SYNTHROID Take 100 mcg by mouth daily before breakfast.   loratadine 10 MG tablet Commonly known as: CLARITIN Take 10 mg by mouth daily.   losartan 50 MG tablet Commonly known as: COZAAR TAKE 1 TABLET EVERY DAY   Magnesium 500 MG Caps Take 1 capsule (500 mg total) by mouth daily. What changed: when to take this   metoprolol tartrate 25 MG tablet Commonly known as: LOPRESSOR Take 1 tablet (25 mg total) by mouth 2 (two) times daily.   multivitamin with minerals Tabs tablet Take 1 tablet by mouth daily. Men's One-A-Day 50+   Myrbetriq 25 MG Tb24 tablet Generic drug: mirabegron ER TAKE 1 TABLET EVERY DAY What changed: how much to take   nitroGLYCERIN 0.4 MG SL tablet Commonly known as: NITROSTAT Place 0.4 mg under the tongue every 5 (five) minutes as needed for chest pain. x3 doses as needed for chest pain   omeprazole 40 MG capsule Commonly known as: PRILOSEC TAKE 1 CAPSULE EVERY DAY BEFORE BREAKFAST What changed: See the new instructions.   ondansetron 8 MG tablet Commonly known as: Zofran Take 1 tablet (8 mg total) by mouth 2 (two) times daily as needed. Start on the 3rd day after cisplatin chemotherapy.   Potassium Citrate 15 MEQ  (1620 MG) Tbcr TAKE 1 TABLET TWICE DAILY What changed: when to take this   PROBIOTIC PO Take 1 capsule by mouth daily with breakfast.   prochlorperazine 10 MG tablet Commonly known as: COMPAZINE Take 1 tablet (10 mg total) by mouth every 6 (six) hours as needed (Nausea or vomiting).   rivaroxaban 10 MG Tabs tablet Commonly known as: Xarelto Take 1 tablet (10 mg total) by mouth daily.   solifenacin 10 MG tablet Commonly known as: VESICARE Take 10 mg by mouth daily.   testosterone enanthate 200 MG/ML injection Commonly known as: DELATESTRYL INJECT 1ML ('200MG'$ ) INTO THE MUSCLE EVERY 14 DAYS. DISCARD VIAL AFTER 28 DAYS. What changed:  how much to take when to take this   vitamin C 500 MG tablet Commonly known as: ASCORBIC ACID Take 500 mg by mouth daily.   VITAMIN D-3 PO Take 2,000 Units by mouth 2 (two) times daily.        Contact information for after-discharge care     Destination     [facility] Preferred SNF .   Service: Skilled Nursing Contact information: [place] [phone]                    Discharge Exam: Filed Weights   03/23/22 0500 03/24/22 0500 03/25/22 0500  Weight: 114.2 kg 113.2 kg 114.5 kg   Vitals:   03/24/22 2044 03/25/22 0508  BP: (!) 124/93 (!) 146/81  Pulse: 99 (!) 109  Resp: 18 18  Temp: 98.3 F (36.8 C) 97.9 F (36.6 C)  SpO2: 91% 96%   Examination: Physical Exam:  Constitutional: WN/WD obese Caucasian male, in no acute distress appears calm and comfortable and he is Awake and is oriented Respiratory: Diminished to auscultation bilaterally, no wheezing, rales, rhonchi or crackles. Normal respiratory effort and patient is not tachypenic. No accessory muscle use.  Unlabored breathing Cardiovascular: RRR, no murmurs / rubs / gallops. S1 and S2 auscultated. No extremity edema.  Abdomen: Soft, non-tender, distended  secondary body habitus. Bowel sounds  positive.  GU: Deferred. Musculoskeletal: No clubbing / cyanosis of digits/nails. No joint deformity upper and lower extremities.  Neurologic: CN 2-12 grossly intact with no focal deficits. Romberg sign and cerebellar reflexes not assessed.  Psychiatric: Normal judgment and insight. Alert and oriented x 3. Normal mood and appropriate affect.   Condition at discharge: stable  The results of significant diagnostics from this hospitalization (including imaging, microbiology, ancillary and laboratory) are listed below for reference.   Imaging Studies: CT HEAD WO CONTRAST (5MM)  Result Date: 03/22/2022 CLINICAL DATA:  Dizziness, persistent/recurrent, cardiac or vascular cause suspected EXAM: CT HEAD WITHOUT CONTRAST TECHNIQUE: Contiguous axial images were obtained from the base of the skull through the vertex without intravenous contrast. RADIATION DOSE REDUCTION: This exam was performed according to the departmental dose-optimization program which includes automated exposure control, adjustment of the mA and/or kV according to patient size and/or use of iterative reconstruction technique. COMPARISON:  MRI head Feb 27, 2022. FINDINGS: Brain: No evidence of acute infarction, hemorrhage, hydrocephalus, extra-axial collection or mass lesion/mass effect. Patchy white matter hypodensities, nonspecific but compatible with chronic microvascular disease. Vascular: No hyperdense vessel identified. Skull: No acute fracture. Sinuses/Orbits: Clear sinuses.  No acute orbital findings. Other: Incompletely imaged known nasopharyngeal mass. Small right mastoid effusion. IMPRESSION: 1. No evidence of acute intracranial abnormality. 2. Incompletely imaged known nasopharyngeal mass. Electronically Signed   By: Margaretha Sheffield M.D.   On: 03/22/2022 11:20   [place] CORE BIOPSY (SOFT TISSUE)  Result Date: 03/17/2022 INDICATION: History of DLBCL with hypermetabolic LEFT gluteal/flank mass EXAM: ULTRASOUND-GUIDED LEFT GLUTEAL SOFT  TISSUE MASS BIOPSY COMPARISON:  CT AP, 03/06/2022.  PET-CT, 03/02/2022 MEDICATIONS: None ANESTHESIA/SEDATION: Local anesthetic and single agent sedation was employed during this procedure. A total of fentanyl 50 mcg was administered intravenously. The patient's level of consciousness and vital signs were monitored continuously by radiology nursing throughout the procedure under my direct supervision. COMPLICATIONS: None immediate. TECHNIQUE: Informed written consent was obtained from the patient and/or patient's representative after a discussion of the risks, benefits and alternatives to treatment. Questions regarding the procedure were encouraged and answered. Initial ultrasound scanning demonstrated hypervascular subcutaneous LEFT gluteal soft tissue mass. An ultrasound image was saved for documentation purposes. The procedure was planned. A timeout was performed prior to the initiation of the procedure. The operative was prepped and draped in the usual sterile fashion, and a sterile drape was applied covering the operative field. A timeout was performed prior to the initiation of the procedure. Local anesthesia was provided with 1% lidocaine with epinephrine. Under direct ultrasound guidance, an 18 gauge core needle device was utilized to obtain to obtain 4 core needle biopsies of the LEFT gluteal soft tissue mass. The samples were placed in saline and submitted to pathology. The needle was removed and hemostasis was achieved with manual compression. Post procedure scan was negative for significant hematoma. A dressing was placed. The patient tolerated the procedure well without immediate postprocedural complication. IMPRESSION: Successful ultrasound guided biopsy of LEFT gluteal soft tissue mass, as above. Michaelle Birks, MD [facility] Electronically Signed   By: Michaelle Birks M.D.   On: 03/17/2022 09:32   MR BRAIN W WO CONTRAST  Result Date:  03/07/2022 CLINICAL DATA:  Brain metastases suspected EXAM: MRI HEAD WITHOUT AND WITH CONTRAST TECHNIQUE: Multiplanar, multiecho pulse sequences of the brain and surrounding structures were obtained without and with intravenous contrast. CONTRAST:  89m GADAVIST GADOBUTROL 1 MMOL/ML IV SOLN  COMPARISON:  PET CT from 5 days ago FINDINGS: Brain: No enhancement or swelling to suggest metastatic disease. Small FLAIR hyperintensities in the cerebral white matter attributed to chronic small vessel ischemia. No acute infarct, hydrocephalus, or collection. Cerebral volume loss which is generalized. Vascular: Slow flow in the right transverse and sigmoid dural sinuses when comparing T2 and postcontrast imaging, attributed to tumoral compression in the right neck. Skull and upper cervical spine: No skull base erosion or visible bony metastasis. Sinuses/Orbits: Right mastoid opacification associated with a nasopharyngeal mass which is right eccentric. Other: Nasopharyngeal mass crossing midline but bulky towards the right with length of 6 cm and thickness of 18 mm. There is contact of the carotid and parapharyngeal spaces on the right. No evidence of intracranial extension IMPRESSION: 1. 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 No skull base erosion or intracranial extension. 2. Right mastoid effusion. Electronically Signed   By: Jorje Guild M.D.   On: 03/07/2022 11:53   CT ABDOMEN PELVIS W CONTRAST  Result Date: 03/06/2022 CLINICAL DATA:  Urinary frequency. Undergoing treatment for urinary tract infection without improvement. Clinical concern for renal mass or abscess. Undergoing chemotherapy for diffuse large B cell lymphoma. EXAM: CT ABDOMEN AND PELVIS WITH CONTRAST TECHNIQUE: Multidetector CT imaging of the abdomen and pelvis was performed using the standard protocol following bolus administration of intravenous contrast. RADIATION DOSE REDUCTION: This exam was performed  according to the departmental dose-optimization program which includes automated exposure control, adjustment of the mA and/or kV according to patient size and/or use of iterative reconstruction technique. CONTRAST:  183m OMNIPAQUE IOHEXOL 300 MG/ML  SOLN COMPARISON:  PET-CT dated 03/02/2022 and abdomen and pelvis CT dated 09/05/2019. FINDINGS: Lower chest: The 4.3 x 3.9 cm right lower lobe lung mass seen on 03/02/2022 currently measures 4.7 x 4.4 cm on image number 15/4. There is interval visualization of a 5 mm similar appearing nodule adjacent to this mass posteriorly and superiorly on image number 13/4. There is also bronchial mucous plugging at the posterior, inferior aspect of the mass, without significant change. The heart remains mildly enlarged. Minimal pericardial effusion with a maximum thickness of 6 mm. Atheromatous calcifications, including the coronary arteries and aorta. Hepatobiliary: Unremarkable liver. Mild sludge or noncalcified gallstones in the dependent portion of the gallbladder. No gallbladder wall thickening or pericholecystic fluid. Pancreas: Moderate diffuse pancreatic atrophy. Spleen: Normal in size without focal abnormality. Adrenals/Urinary Tract: Low-density right adrenal nodule with medium density thin internal septations in the superior aspect of the right adrenal gland, measuring 2.9 x 1.5 cm on coronal image number 47, without significant change since 09/05/2019. No increased activity on the recent PET-CT. Unremarkable left adrenal gland. Tiny lower pole right renal calculus. 4 mm mid left renal calculus. Small mid left renal cyst. No bladder or ureteral calculi no hydronephrosis. The distal right ureter is obscured by streak artifacts from patient's right hip prosthesis. Stomach/Bowel: Distended rectum containing stool and gas. Moderate stool throughout the remainder of the colon. Unremarkable stomach and small bowel. The appendix is not visualized and surgically absent by  history. Vascular/Lymphatic: Atheromatous arterial calcifications without aneurysm. Enlarged, recently hypermetabolic left inguinal lymph node with a short axis diameter 19 mm on image number 98/2. Multiple additional enlarged left pelvic lymph nodes, including a common femoral node with a short axis diameter of 31 mm on image number 84/2 and no obturator node with a short axis diameter of 27 mm on image number 81/2. Reproductive: Prostate is unremarkable. Other:  Small to moderate-sized ventral hernia containing fat small to moderate-sized infraumbilical hernia containing a small to moderate-sized supraumbilical hernia containing fat without change. Small left inguinal hernia containing fat. Musculoskeletal: Right hip prosthesis with associated streak artifact. Severe L1 vertebral compression deformity with bony retropulsion causing moderate to marked canal stenosis without significant change. Associated acute kyphosis without significant change. No acute fractures or acute subluxations. Mild anterior spur formation at multiple levels in the lower thoracic spine and moderate to large anterior spurs at the anterior T12 and L2 levels on both sides of the severely compressed L1 vertebra. Stable anterior subluxation of the lumbar spine inferior to the L1 fracture relative to the spine above the fracture. Marked right psoas, iliacus and iliopsoas muscle atrophy. IMPRESSION: 1. No renal mass or abscess. 2. Small, nonobstructing calculus in each kidney. 3. Mild increase in size of the previously demonstrated right lower lobe hypermetabolic mass with an interval small satellite nodule posteriorly. This is concerning for a primary lung neoplasm or lymphoma involving the lung. 4. Recently demonstrated left pelvic and inguinal adenopathy, most likely due to the patient's known lymphoma. 5. Stable small to moderate-sized supraumbilical hernia containing herniated fat. 6. Stable right adrenal probable adenoma. Electronically  Signed   By: Claudie Revering M.D.   On: 03/06/2022 16:39   DG Chest Port 1 View  Result Date: 03/06/2022 CLINICAL DATA:  Possible sepsis. EXAM: PORTABLE CHEST 1 VIEW COMPARISON:  10/13/2021. FINDINGS: Cardiac silhouette is normal in size. No mediastinal or hilar masses. Right anterior chest wall Port-A-Cath is stable, tip in the mid superior vena cava. Chronic prominence of the interstitial markings bilaterally. Lungs otherwise clear. No pleural effusion or pneumothorax. Skeletal structures are grossly intact. IMPRESSION: No active disease. Electronically Signed   By: Lajean Manes M.D.   On: 03/06/2022 15:04   NM PET Image Restag (PS) Skull Base To Thigh  Result Date: 03/02/2022 CLINICAL DATA:  Follow-up treatment strategy for diffuse large B-cell lymphoma. EXAM: NUCLEAR MEDICINE PET SKULL BASE TO THIGH TECHNIQUE: 0.8 mCi F-18 FDG was injected intravenously. Full-ring PET imaging was performed from the skull base to thigh after the radiotracer. CT data was obtained and used for attenuation correction and anatomic localization. Fasting blood glucose: 107 mg/dl COMPARISON:  PET-CT dated November 22, 2018; PET-CT dated August 22, 2018 FINDINGS: Mediastinal blood pool activity: SUV max 2.8 Liver activity: SUV max 3.8 NECK: New marked hypermetabolic soft tissue thickening of the right posterior nasopharynx with SUV max of 20.5. Large lymph node located anterior to the right sternocleidomastoid muscle unchanged in size, measuring 2.2 cm in short axis, and demonstrates similar mild FDG uptake, SUV max of 2.6, previously 2.4. New hypermetabolic subcentimeter right level IIb lymph node measuring 5 mm on series 4, image 34. Incidental CT findings: none CHEST: New hypermetabolic right lower lobe lung mass measuring 3.9 x 4.3 cm with SUV max of 12.4. Incidental CT findings: Left main and three-vessel coronary artery calcifications. Atherosclerotic disease of the thoracic aorta. Right chest wall port with tip in the mid  SVC. ABDOMEN/PELVIS: Enlarged hypermetabolic left pelvic and left inguinal lymph nodes. Reference left pelvic sidewall lymph node measuring 2.8 cm in short axis on series 4, image 190 with SUV max of 33.5. Enlarged left inguinal lymph node measuring 1.9 cm in short axis on series 4, image 207 with SUV max of hypermetabolic soft tissue nodules and masses of the left buttocks. Largest measures 7.7 x 4.1 cm on series 4, image 189 with SUV max of 18.5. Incidental  CT findings: Simple appearing cyst of the left kidney, no further follow-up imaging is recommended for this lesion. Nonobstructing stone of the left kidney. Atherosclerotic disease of the abdominal aorta. Moderate fat containing umbilical hernia. Air is seen in the urinary bladder. SKELETON: No suspicious focal uptake to suggest osseous metastatic disease. Incidental CT findings: Prior total left hip arthroplasty with cerclage wires and surrounding hypermetabolic activity, similar to prior exam. Unchanged severe compression deformity at L1. Spinous process fractures with associated FDG uptake extending from approximately the T8-T1 levels. IMPRESSION: 1. New marked hypermetabolic soft tissue thickening of the right posterior nasopharynx. 2. New hypermetabolic subcentimeterright level IIb lymph node. 3. New hypermetabolic right lower lobe lung mass. 4. New enlarged and hypermetabolic left pelvic and left inguinal lymph nodes. 5. New hypermetabolic soft tissue nodules and masses of the left buttocks. 6. Enlarged lymph node located anterior to the right sternocleidomastoid muscle is unchanged in size and demonstrates similar FDG uptake, likely treated disease. 7. Spinous process fractures with associated FDG uptake extending from approximately the T8-T1 levels. Correlate for history of recent trauma. 8. Air is seen in the urinary bladder, possibly due to recent instrumentation or infection. Correlate with urinalysis. Electronically Signed   By: Yetta Glassman  M.D.   On: 03/02/2022 15:53   CT Head Wo Contrast  Result Date: 02/24/2022 CLINICAL DATA:  Head trauma. EXAM: CT HEAD WITHOUT CONTRAST TECHNIQUE: Contiguous axial images were obtained from the base of the skull through the vertex without intravenous contrast. RADIATION DOSE REDUCTION: This exam was performed according to the departmental dose-optimization program which includes automated exposure control, adjustment of the mA and/or kV according to patient size and/or use of iterative reconstruction technique. COMPARISON:  Head CT dated 11/08/2018. FINDINGS: Brain: Mild age-related atrophy and chronic microvascular ischemic changes. There is no acute intracranial hemorrhage. No mass effect or midline shift. No extra-axial fluid collection. Vascular: No hyperdense vessel or unexpected calcification. Skull: Normal. Negative for fracture or focal lesion. Sinuses/Orbits: The visualized paranasal sinuses and the left mastoid air cells are clear. Mild right mastoid effusions. Other: None IMPRESSION: 1. No acute intracranial pathology. 2. Mild age-related atrophy and chronic microvascular ischemic changes. Electronically Signed   By: Anner Crete M.D.   On: 02/24/2022 20:35    Microbiology: Results for orders placed or performed during the hospital encounter of 03/19/22  Culture, blood (Routine X 2) w Reflex to ID Panel     Status: None   Collection Time: 03/19/22 10:20 PM   Specimen: BLOOD  Result Value Ref Range Status   Specimen Description   Final    BLOOD RIGHT ANTECUBITAL Performed at [facility] [address] [place], [place] [address]    Special Requests   Final    BOTTLES DRAWN AEROBIC AND ANAEROBIC Blood Culture adequate volume Performed at [facility] [address], [place], [place] [address]    Culture   Final    NO GROWTH 5 DAYS Performed at [facility], [place] [address], [place], [place] [address]    Report Status 03/25/2022 FINAL   Final  Culture, blood (Routine X 2) w Reflex to ID Panel     Status: None   Collection Time: 03/19/22 10:21 PM   Specimen: BLOOD  Result Value Ref Range Status   Specimen Description   Final    BLOOD LEFT ANTECUBITAL Performed at [facility] [address] [place], [place] [address]    Special Requests   Final  BOTTLES DRAWN AEROBIC ONLY Blood Culture adequate volume Performed at [facility] [address], [place], [place] [address]    Culture   Final    NO GROWTH 5 DAYS Performed at [facility], [place] [address], [place], [place] [address]    Report Status 03/25/2022 FINAL  Final  Urine Culture     Status: None   Collection Time: 03/20/22 12:55 AM   Specimen: In/Out Cath Urine  Result Value Ref Range Status   Specimen Description   Final    IN/OUT CATH URINE Performed at [facility] [address] [place], [place] [address]    Special Requests   Final    Immunocompromised Performed at [facility], [place] [address] [place], [place] [address]    Culture   Final    NO GROWTH Performed at [facility], [place] [address] [place] [address] [place], [place] [address]    Report Status 03/21/2022 FINAL  Final    Labs: CBC: Recent Labs  Lab 03/19/22 1233 03/20/22 0547 03/21/22 0608 03/24/22 0853 03/25/22 0220  WBC 8.4 7.0 6.0 8.9 8.1  NEUTROABS 5.7 4.9  --  8.3* 7.6  HGB 16.7 15.4 15.6 15.0 14.7  HCT 48.1 45.6 46.6 44.1 43.4  MCV 91.6 93.8 93.6 93.2 93.5  PLT 173 143* 142* 142* 078*   Basic Metabolic Panel: Recent Labs  Lab 03/20/22 0547 03/21/22 0608 03/22/22 0502 03/24/22 0853 03/25/22 0220  NA 138 138 138 138 138  K 3.8 3.7 3.5 4.4 4.7  CL 104 103 106 105 105  CO2 '25 26 24 25 26  '$ GLUCOSE 108* 101* 107* 147* 146*  BUN 41* 30* 25* 37* 47*  CREATININE 2.10* 1.32* 1.21 1.15 1.10  CALCIUM 10.1 9.6 9.1 9.0 9.1  MG 1.8  --   --  1.9 1.8  PHOS  --   --   --  3.1 2.9   Liver Function  Tests: Recent Labs  Lab 03/19/22 1233 03/20/22 0547 03/24/22 0853 03/25/22 0220  AST 41 39 36 59*  ALT 32 29 35 57*  ALKPHOS 74 64 57 51  BILITOT 1.4* 1.8* 1.1 1.0  PROT 6.7 6.1* 6.1* 6.0*  ALBUMIN 3.7 3.1* 3.0* 2.9*   CBG: No results for input(s): "GLUCAP" in the last 168 hours.  Discharge time spent: greater than 30 minutes.  Signed: Raiford Noble, DO [facility] 03/25/2022

## 2022-03-26 ENCOUNTER — Other Ambulatory Visit: Payer: Self-pay

## 2022-03-26 DIAGNOSIS — I5032 Chronic diastolic (congestive) heart failure: Secondary | ICD-10-CM | POA: Diagnosis not present

## 2022-03-26 DIAGNOSIS — C8510 Unspecified B-cell lymphoma, unspecified site: Secondary | ICD-10-CM | POA: Diagnosis not present

## 2022-03-26 DIAGNOSIS — R627 Adult failure to thrive: Secondary | ICD-10-CM | POA: Diagnosis not present

## 2022-03-26 DIAGNOSIS — Z5112 Encounter for antineoplastic immunotherapy: Secondary | ICD-10-CM | POA: Diagnosis present

## 2022-03-26 DIAGNOSIS — C8339 Diffuse large B-cell lymphoma, extranodal and solid organ sites: Secondary | ICD-10-CM | POA: Diagnosis present

## 2022-03-26 DIAGNOSIS — J301 Allergic rhinitis due to pollen: Secondary | ICD-10-CM | POA: Diagnosis not present

## 2022-03-26 DIAGNOSIS — J3089 Other allergic rhinitis: Secondary | ICD-10-CM | POA: Diagnosis not present

## 2022-03-26 DIAGNOSIS — Z5189 Encounter for other specified aftercare: Secondary | ICD-10-CM | POA: Diagnosis not present

## 2022-03-26 DIAGNOSIS — Z87440 Personal history of urinary (tract) infections: Secondary | ICD-10-CM | POA: Diagnosis not present

## 2022-03-26 DIAGNOSIS — L98419 Non-pressure chronic ulcer of buttock with unspecified severity: Secondary | ICD-10-CM | POA: Diagnosis not present

## 2022-03-26 DIAGNOSIS — N178 Other acute kidney failure: Secondary | ICD-10-CM | POA: Diagnosis not present

## 2022-03-26 DIAGNOSIS — Z7401 Bed confinement status: Secondary | ICD-10-CM | POA: Diagnosis not present

## 2022-03-26 DIAGNOSIS — J3081 Allergic rhinitis due to animal (cat) (dog) hair and dander: Secondary | ICD-10-CM | POA: Diagnosis not present

## 2022-03-26 DIAGNOSIS — N1831 Chronic kidney disease, stage 3a: Secondary | ICD-10-CM | POA: Diagnosis not present

## 2022-03-26 DIAGNOSIS — Z5111 Encounter for antineoplastic chemotherapy: Secondary | ICD-10-CM

## 2022-03-26 DIAGNOSIS — C8330 Diffuse large B-cell lymphoma, unspecified site: Secondary | ICD-10-CM | POA: Diagnosis not present

## 2022-03-26 DIAGNOSIS — N139 Obstructive and reflux uropathy, unspecified: Secondary | ICD-10-CM | POA: Diagnosis not present

## 2022-03-26 DIAGNOSIS — R4182 Altered mental status, unspecified: Secondary | ICD-10-CM | POA: Diagnosis not present

## 2022-03-26 DIAGNOSIS — E039 Hypothyroidism, unspecified: Secondary | ICD-10-CM | POA: Diagnosis not present

## 2022-03-26 DIAGNOSIS — N179 Acute kidney failure, unspecified: Secondary | ICD-10-CM | POA: Diagnosis not present

## 2022-03-26 DIAGNOSIS — M2550 Pain in unspecified joint: Secondary | ICD-10-CM | POA: Diagnosis not present

## 2022-03-26 DIAGNOSIS — J449 Chronic obstructive pulmonary disease, unspecified: Secondary | ICD-10-CM | POA: Diagnosis not present

## 2022-03-26 DIAGNOSIS — I10 Essential (primary) hypertension: Secondary | ICD-10-CM | POA: Diagnosis not present

## 2022-03-26 DIAGNOSIS — N401 Enlarged prostate with lower urinary tract symptoms: Secondary | ICD-10-CM | POA: Diagnosis not present

## 2022-03-26 DIAGNOSIS — I480 Paroxysmal atrial fibrillation: Secondary | ICD-10-CM | POA: Diagnosis not present

## 2022-03-26 DIAGNOSIS — I5022 Chronic systolic (congestive) heart failure: Secondary | ICD-10-CM | POA: Diagnosis not present

## 2022-03-26 DIAGNOSIS — I4891 Unspecified atrial fibrillation: Secondary | ICD-10-CM | POA: Diagnosis not present

## 2022-03-26 MED ORDER — HEPARIN SOD (PORK) LOCK FLUSH 100 UNIT/ML IV SOLN
500.0000 [IU] | INTRAVENOUS | Status: DC | PRN
  Filled 2022-03-26: qty 5

## 2022-03-26 MED ORDER — HYDROCODONE-ACETAMINOPHEN 7.5-325 MG PO TABS: 1.0000 | ORAL_TABLET | Freq: Four times a day (QID) | ORAL | 0 refills | Status: DC | PRN

## 2022-03-26 MED ORDER — CLONAZEPAM 1 MG PO TABS: 1.0000 mg | ORAL_TABLET | Freq: Four times a day (QID) | ORAL | 0 refills | Status: DC | PRN

## 2022-03-26 NOTE — Plan of Care (Signed)
  Problem: Health Behavior/Discharge Planning: Goal: Ability to manage health-related needs will improve Outcome: Progressing   

## 2022-03-26 NOTE — Progress Notes (Signed)
Brief oncology note:  Chart reviewed.  Note plan to discharge to SNF later today.  I have sent a scheduling message to the cancer center to arrange for day 8 of his chemotherapy on Monday 6/19 and for a Udenyca injection on 6/20.  The patient will also have lab work and see Dr. Irene Limbo during his infusion on Monday.  The patient will be contacted by scheduling with the date/time of his appointments.  Mikey Bussing

## 2022-03-26 NOTE — Progress Notes (Signed)
Attempted to call report to [facility] (to nurse Ladora Daniel). Nurse Ladora Daniel was not available. Left the facility know my name and contact number for the nurse to return my call for report.

## 2022-03-26 NOTE — Progress Notes (Signed)
Nurse Ladora Daniel from [facility] returned my call. Report given to Nurse Ladora Daniel at [facility].

## 2022-03-26 NOTE — TOC Transition Note (Signed)
Transition of Care [facility]) - CM/SW Discharge Note  Patient Details  Name: [name] MRN: [id number] Date of Birth: 02/12/1946  Transition of Care [facility]) CM/SW Contact:  Sherie Don, LCSW Phone Number: 03/26/2022, 10:44 AM  Clinical Narrative: Patient to be discharged to [facility] today for rehab. Patient will go to room 3217 and the number for report is [phone]. Discharge summary, discharge orders, and SNF transfer report faxed to facility in hub.  Medical necessity form done; PTAR scheduled. Discharge packet completed. Patient notified of discharge. RN updated. TOC signing off.  Final next level of care: Skilled Nursing Facility Barriers to Discharge: Barriers Resolved  Patient Goals and CMS Choice Patient states their goals for this hospitalization and ongoing recovery are:: Rehab CMS Medicare.gov Compare Post Acute Care list provided to:: Patient Choice offered to / list presented to : Patient  Discharge Placement Existing PASRR number confirmed : 03/24/22          Patient chooses bed at: [facility] Patient to be transferred to facility by: PTAR Patient and family notified of of transfer: 03/26/22  Discharge Plan and Services Discharge Planning Services: CM Consult Post Acute Care Choice: [place]          DME Arranged: N/A DME Agency: NA  Readmission Risk Interventions    03/22/2022   10:07 AM  Readmission Risk Prevention Plan  Transportation Screening Complete  PCP or Specialist Appt within 3-5 Days Complete  HRI or [place] Complete  Social Work Consult for [place] Planning/Counseling Complete  Palliative Care Screening Complete  Medication Review Press photographer) Complete

## 2022-03-26 NOTE — Discharge Summary (Signed)
Physician Discharge Summary   Patient: [name] MRN: [id number] DOB: Feb 21, 1946  Admit date:     03/19/2022  Discharge date: 03/26/22  Discharge Physician: Raiford Noble, DO    PCP: Cassandria Anger, MD   Recommendations at discharge:   Follow-up with PCP within 1 to 2 weeks and repeat CBC, CMP, mag, Phos within 1 week Follow-up with medical oncology within 1 to 2 weeks for continued systemic chemotherapy  Discharge Diagnoses: Principal Problem:   AKI (acute kidney injury) ([place]) Active Problems:   Hypothyroidism   Essential hypertension   Morbid obesity ([place])   Fall   Chronic anticoagulation   Chronic obstructive pulmonary disease (HCC)   Diffuse large B-cell lymphoma (HCC)   Chronic diastolic heart failure (HCC)   BPH with obstruction/lower urinary tract symptoms   Paroxysmal atrial fibrillation (HCC)   Stage 3a chronic kidney disease (CKD) (HCC) - Baseline Scr 1.5 - 1.6   DNR (do not resuscitate)/DNI(Do Not Intubate)   Encounter for antineoplastic chemotherapy  Resolved Problems:   * No resolved hospital problems. [facility] Course: The patient is a 76 year old Caucasian male with a past medical history significant for but not limited to B-cell lymphoma, hypertension, BPH, hypothyroidism, proximal atrial fibrillation on anticoagulation with Xarelto, chronic diastolic CHF, history of CKD stage IIIa with a baseline creatinine of 1.5-1.6 as well as other comorbidities who presented to the hospital as a direct admission from the oncology clinic.  Patient was having chills at home for few days and had been increasing having urinary symptoms at home with occasional dysuria.  He does have a history of BPH.  He was seen at oncology clinic with failure to thrive and fatigue and a recent PET scan was done and showed widespread pulmonary involvement of his B-cell lymphoma.  Labs were drawn in clinic and he had a creatinine of 2.17.  He was admitted and initiated on the first  cycle of chemotherapy for his B-cell lymphoma with R-GCD on 03/22/2022.  He was a little confused but then his confusion is improved and he is more awake and alert and talkative.  PT OT evaluated and recommending SNF.  He has a history of diffuse large B-cell lymphoma stage III of the left tonsil and he underwent R-CHOP with G-CSF support for 6 cycles starting 06/23/2018 and his 6 cycles of R-CHOP completed on 10/12/2018.  He now has recurrent B-cell lymphoma with left buttock soft tissue masses, pulmonary medicine widespread lymphadenopathy as well as acute on chronic renal insufficiency and creatinine is improved now after IV fluid hydration.  Given that his mentation is improved and PT OT recommending SNF he is medically stable and will need to be scheduled for outpatient gemcitabine and G-CSF.  His labs have improved however he does have slightly abnormal LFTs in the setting of chemotherapy and dexamethasone use that will need to be followed outpatient setting and work-up further if necessary.  ADDENDUM 03/26/22: Patient remains medically stable to D/C. Patient was discharged yesterday but unfortunately patient could not go as SNF did not have a bed available until today. No acute issues over night. Patient feels well and is ready to go to Rehab.   Assessment and Plan:  History of B-cell lymphoma with Recurrent Large B-Cell Lymphoma with left buttock soft tissue mass, pulmonary mass and was per lymphadenopathy -Started on chemotherapy per oncology -Oncology feels that he will need continued outpatient chemotherapy and he has been scheduled for cycle 1 day 8 of gemcitabine and G-CSF  as an outpatient -Continue with dexamethasone 12 mg p.o. daily for 3 doses and now is being completed  -PT OT recommending SNF patient is medically stable for discharge   Acute kidney injury on CKD stage IIIa -Bladder scan showed 107 mL in the bladder. -Started on gentle IV normal saline at 100 mL/h and this is now  stopped -Patient's BUN/Cr went from 41/2.10 -> 37/1.15 and is now 47/12.10 -HCTZ and ARB on hold but can be resumed now -Continue to avoid nephrotoxic medications, contrast dyes, hypotension and dehydration to ensure adequate renal perfusion and renally dose medications -Repeat CMP within 1 week   Multiple falls at home -As per patient husband at bedside, patient had about 5 falls at home and 24 hours prior to coming to the ED -Patient denies hitting his head or losing consciousness -He was on Xarelto which was on hold at the time of admission -CT head obtained yesterday given worsened confusion was negative for any acute bleed -Was resumed on Xarelto on 03/23/22   Hyperbilirubinemia -Patient's T Bili went from 1.8 -> 1.1 -> 1.0 -Continue to Monitor and Trend and Repeat CMP in the AM    Hypoalbuminemia -Patient's Albumin Level went from 3.1 -> 3.0 -> 2.9 -Continue to Monitor and Trend -Repeat CMP in the AM    Thrombocytopenia -Patient's Platelet Count went from 163 -> 142 -> 148 -Continue to Monitor of S/Sx of Bleeding -No overt Bleeding noted -Repeat CBC in the AM   Abnormal LFTs -Mildly elevated in the setting of dexamethasone and chemotherapy -AST went from 39 -> 36 -> 59  -ALT went from 29 -> 35 -> 57 -Continue to monitor and trend LFTs in the outpatient setting and if necessary will need an outpatient right upper quadrant ultrasound and an acute hepatitis panel    ?  UTI -Urine culture and blood culture obtained. -Patient was Started on empiric cefepime and sinceUrine culture is negative -Will discontinue Cefepime now   Depression and anxiety -New with bupropion 300 g p.o. daily as well as clonazepam 1 mg p.o. every 6 as needed for anxiety   Paroxysmal atrial fibrillation -Stable -Patient was taking Cardizem CD 240 mg daily at home and this has been resumed -Continue Rivaroxaban 10 mg po Daily as above   BPH -Bladder scan only showed 107 ml -Continue Alfuzosin 10  mg p.o. nightly as well as Darifenacin 15 mg p.o. daily 10 mg daily   Chronic diastolic heart failure -Euvolemic -Continue to monitor for signs and symptoms of volume overload -His losartan and his hydrochlorothiazide has been on hold -Continue with his isosorbide mononitrate 30 mg p.o. daily -Continue to monitor for signs and symptoms of volume overload   Diffuse large B-cell lymphoma -Followed by hematology oncology -Undergoing chemotherapy as above   COPD -Stable -SpO2: 96 % -On room air   Hypertension -Blood pressure is stable -Losartan and HCTZ on hold due to acute kidney injury but can resume now that BUN/Cr is improved -Continue Diltiazem 240 mg po daily and Isosorbide Mononitrate 30 mg po Daily  -Continue to monitor blood pressures per protocol -Last BP reading is 146/81   Hypothyroidism -Continue Levothyroxine 100 mcg po Daily    Obesity -Complicates overall prognosis and care -Estimated body mass index is 31.48 kg/m as calculated from the following:   Height as of this encounter: 6' 3.5" (1.918 m).   Weight as of this encounter: 115.8 kg.  -Weight Loss and Dietary Counseling given   GOC -DNR  Nutrition Documentation  [place] Admission (Current) from 03/19/2022 in [facility]  Nutrition Problem Increased nutrient needs  Etiology acute illness, chronic illness, cancer and cancer related treatments  Nutrition Goal Patient will meet greater than or equal to 90% of their needs  Interventions Ensure Enlive (each supplement provides 350kcal and 20 grams of protein)      Consultants: Medical oncology Procedures performed: CT of the head Disposition: Skilled nursing facility Diet recommendation:  Cardiac diet  DISCHARGE MEDICATION: Allergies as of 03/26/2022       Reactions   Short Ragweed Pollen Ext Cough        Medication List     STOP taking these medications    mupirocin ointment 2 % Commonly known as: BACTROBAN        TAKE these medications    acetaminophen 325 MG tablet Commonly known as: TYLENOL Take 2 tablets (650 mg total) by mouth every 6 (six) hours as needed for mild pain (or Fever >/= 101). What changed:  medication strength how much to take when to take this reasons to take this   albuterol 108 (90 Base) MCG/ACT inhaler Commonly known as: VENTOLIN HFA Inhale 2 puffs into the lungs every 6 (six) hours as needed for wheezing or shortness of breath.   alfuzosin 10 MG 24 hr tablet Commonly known as: UROXATRAL TAKE 1 TABLET AT BEDTIME What changed: how to take this   allopurinol 100 MG tablet Commonly known as: ZYLOPRIM Take 1 tablet (100 mg total) by mouth 2 (two) times daily.   amoxicillin 500 MG capsule Commonly known as: AMOXIL Take 2,000 mg by mouth daily as needed (dental appointments).   aspirin EC 81 MG tablet Take 81 mg by mouth daily.   atorvastatin 80 MG tablet Commonly known as: LIPITOR Take 80 mg by mouth daily.   buPROPion 300 MG 24 hr tablet Commonly known as: WELLBUTRIN XL TAKE 1 TABLET EVERY DAY WITH BREAKFAST What changed: See the new instructions.   CALCIUM CITRATE + D PO Take 1 tablet by mouth 2 (two) times daily. '1200mg'$  of Calcium and 1000units vitamin D3   clonazePAM 1 MG tablet Commonly known as: KLONOPIN Take 1 tablet (1 mg total) by mouth every 6 (six) hours as needed for anxiety. Take 1 tablet (1 mg total) by mouth every 6 (six) hours as needed. for anxiety Strength: 1 mg What changed: See the new instructions.   CRANBERRY PO Take 2 tablets by mouth daily.   denosumab 60 MG/ML Sosy injection Commonly known as: PROLIA Inject 60 mg as directed every 6 (six) months. Last injection: Feb 2020 Next injection: August 2020   dexamethasone 4 MG tablet Commonly known as: DECADRON Take 5 tablets (20 mg total) by mouth daily. Start the day after carboplatin chemotherapy for 3 days.   diltiazem 240 MG 24 hr capsule Commonly known as: CARDIZEM  CD TAKE 1 CAPSULE EVERY DAY   Docusate Sodium 100 MG capsule Take 100 mg by mouth 2 (two) times daily.   DULoxetine 60 MG capsule Commonly known as: CYMBALTA TAKE 1 CAPSULE TWICE DAILY   EPINEPHrine 0.3 mg/0.3 mL Soaj injection Commonly known as: EPI-PEN Inject 0.3 mg into the muscle as needed for anaphylaxis.   ezetimibe 10 MG tablet Commonly known as: ZETIA TAKE 1 TABLET EVERY DAY   feeding supplement Liqd Take 237 mLs by mouth 2 (two) times daily between meals.   ferrous sulfate 325 (65 FE) MG tablet Take 325 mg by mouth 2 (two) times daily  with a meal.   hydrochlorothiazide 12.5 MG tablet Commonly known as: HYDRODIURIL TAKE 1 TABLET (12.5 MG TOTAL) BY MOUTH DAILY.   HYDROcodone-acetaminophen 7.5-325 MG tablet Commonly known as: NORCO Take 1 tablet by mouth every 6 (six) hours as needed for severe pain.   isosorbide mononitrate 30 MG 24 hr tablet Commonly known as: IMDUR TAKE 1 TABLET (30 MG TOTAL) BY MOUTH DAILY.   levothyroxine 100 MCG tablet Commonly known as: SYNTHROID Take 100 mcg by mouth daily before breakfast.   loratadine 10 MG tablet Commonly known as: CLARITIN Take 10 mg by mouth daily.   losartan 50 MG tablet Commonly known as: COZAAR TAKE 1 TABLET EVERY DAY   Magnesium 500 MG Caps Take 1 capsule (500 mg total) by mouth daily. What changed: when to take this   metoprolol tartrate 25 MG tablet Commonly known as: LOPRESSOR Take 1 tablet (25 mg total) by mouth 2 (two) times daily.   multivitamin with minerals Tabs tablet Take 1 tablet by mouth daily. Men's One-A-Day 50+   Myrbetriq 25 MG Tb24 tablet Generic drug: mirabegron ER TAKE 1 TABLET EVERY DAY What changed: how much to take   nitroGLYCERIN 0.4 MG SL tablet Commonly known as: NITROSTAT Place 0.4 mg under the tongue every 5 (five) minutes as needed for chest pain. x3 doses as needed for chest pain   omeprazole 40 MG capsule Commonly known as: PRILOSEC TAKE 1 CAPSULE EVERY DAY  BEFORE BREAKFAST What changed: See the new instructions.   ondansetron 8 MG tablet Commonly known as: Zofran Take 1 tablet (8 mg total) by mouth 2 (two) times daily as needed. Start on the 3rd day after cisplatin chemotherapy.   Potassium Citrate 15 MEQ (1620 MG) Tbcr TAKE 1 TABLET TWICE DAILY What changed: when to take this   PROBIOTIC PO Take 1 capsule by mouth daily with breakfast.   prochlorperazine 10 MG tablet Commonly known as: COMPAZINE Take 1 tablet (10 mg total) by mouth every 6 (six) hours as needed (Nausea or vomiting).   rivaroxaban 10 MG Tabs tablet Commonly known as: Xarelto Take 1 tablet (10 mg total) by mouth daily.   solifenacin 10 MG tablet Commonly known as: VESICARE Take 10 mg by mouth daily.   testosterone enanthate 200 MG/ML injection Commonly known as: DELATESTRYL INJECT 1ML ('200MG'$ ) INTO THE MUSCLE EVERY 14 DAYS. DISCARD VIAL AFTER 28 DAYS. What changed:  how much to take when to take this   vitamin C 500 MG tablet Commonly known as: ASCORBIC ACID Take 500 mg by mouth daily.   VITAMIN D-3 PO Take 2,000 Units by mouth 2 (two) times daily.        Contact information for after-discharge care     Destination     [facility] Preferred SNF .   Service: Skilled Nursing Contact information: [place] [phone]                    Discharge Exam: Filed Weights   03/24/22 0500 03/25/22 0500 03/26/22 0500  Weight: 113.2 kg 114.5 kg 115.8 kg   Vitals:   03/25/22 2158 03/26/22 0554  BP: 137/71 (!) 151/72  Pulse: 92 99  Resp: 18 18  Temp: 98.4 F (36.9 C) 97.8 F (36.6 C)  SpO2: 96% 96%   Examination: Physical Exam:  Constitutional: WN/WD obese Caucasian male, in no acute distress appears calm and comfortable and he is Awake and is oriented Respiratory: Diminished to auscultation bilaterally, no  wheezing, rales, rhonchi or crackles. Normal respiratory effort  and patient is not tachypenic. No accessory muscle use.  Unlabored breathing Cardiovascular: RRR, no murmurs / rubs / gallops. S1 and S2 auscultated. No extremity edema.  Abdomen: Soft, non-tender, distended secondary body habitus. Bowel sounds positive.  GU: Deferred. Musculoskeletal: No clubbing / cyanosis of digits/nails. No joint deformity upper and lower extremities.  Neurologic: CN 2-12 grossly intact with no focal deficits. Romberg sign and cerebellar reflexes not assessed.  Psychiatric: Normal judgment and insight. Alert and oriented x 3. Normal mood and appropriate affect.   Condition at discharge: stable  The results of significant diagnostics from this hospitalization (including imaging, microbiology, ancillary and laboratory) are listed below for reference.   Imaging Studies: CT HEAD WO CONTRAST (5MM)  Result Date: 03/22/2022 CLINICAL DATA:  Dizziness, persistent/recurrent, cardiac or vascular cause suspected EXAM: CT HEAD WITHOUT CONTRAST TECHNIQUE: Contiguous axial images were obtained from the base of the skull through the vertex without intravenous contrast. RADIATION DOSE REDUCTION: This exam was performed according to the departmental dose-optimization program which includes automated exposure control, adjustment of the mA and/or kV according to patient size and/or use of iterative reconstruction technique. COMPARISON:  MRI head Feb 27, 2022. FINDINGS: Brain: No evidence of acute infarction, hemorrhage, hydrocephalus, extra-axial collection or mass lesion/mass effect. Patchy white matter hypodensities, nonspecific but compatible with chronic microvascular disease. Vascular: No hyperdense vessel identified. Skull: No acute fracture. Sinuses/Orbits: Clear sinuses.  No acute orbital findings. Other: Incompletely imaged known nasopharyngeal mass. Small right mastoid effusion. IMPRESSION: 1. No evidence of acute intracranial abnormality. 2. Incompletely imaged known nasopharyngeal mass.  Electronically Signed   By: Margaretha Sheffield M.D.   On: 03/22/2022 11:20   [place] CORE BIOPSY (SOFT TISSUE)  Result Date: 03/17/2022 INDICATION: History of DLBCL with hypermetabolic LEFT gluteal/flank mass EXAM: ULTRASOUND-GUIDED LEFT GLUTEAL SOFT TISSUE MASS BIOPSY COMPARISON:  CT AP, 03/06/2022.  PET-CT, 03/02/2022 MEDICATIONS: None ANESTHESIA/SEDATION: Local anesthetic and single agent sedation was employed during this procedure. A total of fentanyl 50 mcg was administered intravenously. The patient's level of consciousness and vital signs were monitored continuously by radiology nursing throughout the procedure under my direct supervision. COMPLICATIONS: None immediate. TECHNIQUE: Informed written consent was obtained from the patient and/or patient's representative after a discussion of the risks, benefits and alternatives to treatment. Questions regarding the procedure were encouraged and answered. Initial ultrasound scanning demonstrated hypervascular subcutaneous LEFT gluteal soft tissue mass. An ultrasound image was saved for documentation purposes. The procedure was planned. A timeout was performed prior to the initiation of the procedure. The operative was prepped and draped in the usual sterile fashion, and a sterile drape was applied covering the operative field. A timeout was performed prior to the initiation of the procedure. Local anesthesia was provided with 1% lidocaine with epinephrine. Under direct ultrasound guidance, an 18 gauge core needle device was utilized to obtain to obtain 4 core needle biopsies of the LEFT gluteal soft tissue mass. The samples were placed in saline and submitted to pathology. The needle was removed and hemostasis was achieved with manual compression. Post procedure scan was negative for significant hematoma. A dressing was placed. The patient tolerated the procedure well without immediate postprocedural complication. IMPRESSION: Successful ultrasound guided biopsy of  LEFT gluteal soft tissue mass, as above. Michaelle Birks, MD [facility] Electronically Signed   By: Michaelle Birks M.D.   On: 03/17/2022 09:32   MR BRAIN W WO CONTRAST  Result Date: 03/07/2022  CLINICAL DATA:  Brain metastases suspected EXAM: MRI HEAD WITHOUT AND WITH CONTRAST TECHNIQUE: Multiplanar, multiecho pulse sequences of the brain and surrounding structures were obtained without and with intravenous contrast. CONTRAST:  36m GADAVIST GADOBUTROL 1 MMOL/ML IV SOLN COMPARISON:  PET CT from 5 days ago FINDINGS: Brain: No enhancement or swelling to suggest metastatic disease. Small FLAIR hyperintensities in the cerebral white matter attributed to chronic small vessel ischemia. No acute infarct, hydrocephalus, or collection. Cerebral volume loss which is generalized. Vascular: Slow flow in the right transverse and sigmoid dural sinuses when comparing T2 and postcontrast imaging, attributed to tumoral compression in the right neck. Skull and upper cervical spine: No skull base erosion or visible bony metastasis. Sinuses/Orbits: Right mastoid opacification associated with a nasopharyngeal mass which is right eccentric. Other: Nasopharyngeal mass crossing midline but bulky towards the right with length of 6 cm and thickness of 18 mm. There is contact of the carotid and parapharyngeal spaces on the right. No evidence of intracranial extension IMPRESSION: 1. 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 No skull base erosion or intracranial extension. 2. Right mastoid effusion. Electronically Signed   By: JJorje GuildM.D.   On: 03/07/2022 11:53   CT ABDOMEN PELVIS W CONTRAST  Result Date: 03/06/2022 CLINICAL DATA:  Urinary frequency. Undergoing treatment for urinary tract infection without improvement. Clinical concern for renal mass or abscess. Undergoing chemotherapy for diffuse large B cell lymphoma. EXAM: CT  ABDOMEN AND PELVIS WITH CONTRAST TECHNIQUE: Multidetector CT imaging of the abdomen and pelvis was performed using the standard protocol following bolus administration of intravenous contrast. RADIATION DOSE REDUCTION: This exam was performed according to the departmental dose-optimization program which includes automated exposure control, adjustment of the mA and/or kV according to patient size and/or use of iterative reconstruction technique. CONTRAST:  1025mOMNIPAQUE IOHEXOL 300 MG/ML  SOLN COMPARISON:  PET-CT dated 03/02/2022 and abdomen and pelvis CT dated 09/05/2019. FINDINGS: Lower chest: The 4.3 x 3.9 cm right lower lobe lung mass seen on 03/02/2022 currently measures 4.7 x 4.4 cm on image number 15/4. There is interval visualization of a 5 mm similar appearing nodule adjacent to this mass posteriorly and superiorly on image number 13/4. There is also bronchial mucous plugging at the posterior, inferior aspect of the mass, without significant change. The heart remains mildly enlarged. Minimal pericardial effusion with a maximum thickness of 6 mm. Atheromatous calcifications, including the coronary arteries and aorta. Hepatobiliary: Unremarkable liver. Mild sludge or noncalcified gallstones in the dependent portion of the gallbladder. No gallbladder wall thickening or pericholecystic fluid. Pancreas: Moderate diffuse pancreatic atrophy. Spleen: Normal in size without focal abnormality. Adrenals/Urinary Tract: Low-density right adrenal nodule with medium density thin internal septations in the superior aspect of the right adrenal gland, measuring 2.9 x 1.5 cm on coronal image number 47, without significant change since 09/05/2019. No increased activity on the recent PET-CT. Unremarkable left adrenal gland. Tiny lower pole right renal calculus. 4 mm mid left renal calculus. Small mid left renal cyst. No bladder or ureteral calculi no hydronephrosis. The distal right ureter is obscured by streak artifacts from  patient's right hip prosthesis. Stomach/Bowel: Distended rectum containing stool and gas. Moderate stool throughout the remainder of the colon. Unremarkable stomach and small bowel. The appendix is not visualized and surgically absent by history. Vascular/Lymphatic: Atheromatous arterial calcifications without aneurysm. Enlarged, recently hypermetabolic left inguinal lymph node with a short axis diameter 19 mm on image number 98/2. Multiple additional enlarged left pelvic  lymph nodes, including a common femoral node with a short axis diameter of 31 mm on image number 84/2 and no obturator node with a short axis diameter of 27 mm on image number 81/2. Reproductive: Prostate is unremarkable. Other: Small to moderate-sized ventral hernia containing fat small to moderate-sized infraumbilical hernia containing a small to moderate-sized supraumbilical hernia containing fat without change. Small left inguinal hernia containing fat. Musculoskeletal: Right hip prosthesis with associated streak artifact. Severe L1 vertebral compression deformity with bony retropulsion causing moderate to marked canal stenosis without significant change. Associated acute kyphosis without significant change. No acute fractures or acute subluxations. Mild anterior spur formation at multiple levels in the lower thoracic spine and moderate to large anterior spurs at the anterior T12 and L2 levels on both sides of the severely compressed L1 vertebra. Stable anterior subluxation of the lumbar spine inferior to the L1 fracture relative to the spine above the fracture. Marked right psoas, iliacus and iliopsoas muscle atrophy. IMPRESSION: 1. No renal mass or abscess. 2. Small, nonobstructing calculus in each kidney. 3. Mild increase in size of the previously demonstrated right lower lobe hypermetabolic mass with an interval small satellite nodule posteriorly. This is concerning for a primary lung neoplasm or lymphoma involving the lung. 4. Recently  demonstrated left pelvic and inguinal adenopathy, most likely due to the patient's known lymphoma. 5. Stable small to moderate-sized supraumbilical hernia containing herniated fat. 6. Stable right adrenal probable adenoma. Electronically Signed   By: Claudie Revering M.D.   On: 03/06/2022 16:39   DG Chest Port 1 View  Result Date: 03/06/2022 CLINICAL DATA:  Possible sepsis. EXAM: PORTABLE CHEST 1 VIEW COMPARISON:  10/13/2021. FINDINGS: Cardiac silhouette is normal in size. No mediastinal or hilar masses. Right anterior chest wall Port-A-Cath is stable, tip in the mid superior vena cava. Chronic prominence of the interstitial markings bilaterally. Lungs otherwise clear. No pleural effusion or pneumothorax. Skeletal structures are grossly intact. IMPRESSION: No active disease. Electronically Signed   By: Lajean Manes M.D.   On: 03/06/2022 15:04   NM PET Image Restag (PS) Skull Base To Thigh  Result Date: 03/02/2022 CLINICAL DATA:  Follow-up treatment strategy for diffuse large B-cell lymphoma. EXAM: NUCLEAR MEDICINE PET SKULL BASE TO THIGH TECHNIQUE: 0.8 mCi F-18 FDG was injected intravenously. Full-ring PET imaging was performed from the skull base to thigh after the radiotracer. CT data was obtained and used for attenuation correction and anatomic localization. Fasting blood glucose: 107 mg/dl COMPARISON:  PET-CT dated November 22, 2018; PET-CT dated August 22, 2018 FINDINGS: Mediastinal blood pool activity: SUV max 2.8 Liver activity: SUV max 3.8 NECK: New marked hypermetabolic soft tissue thickening of the right posterior nasopharynx with SUV max of 20.5. Large lymph node located anterior to the right sternocleidomastoid muscle unchanged in size, measuring 2.2 cm in short axis, and demonstrates similar mild FDG uptake, SUV max of 2.6, previously 2.4. New hypermetabolic subcentimeter right level IIb lymph node measuring 5 mm on series 4, image 34. Incidental CT findings: none CHEST: New hypermetabolic right  lower lobe lung mass measuring 3.9 x 4.3 cm with SUV max of 12.4. Incidental CT findings: Left main and three-vessel coronary artery calcifications. Atherosclerotic disease of the thoracic aorta. Right chest wall port with tip in the mid SVC. ABDOMEN/PELVIS: Enlarged hypermetabolic left pelvic and left inguinal lymph nodes. Reference left pelvic sidewall lymph node measuring 2.8 cm in short axis on series 4, image 190 with SUV max of 33.5. Enlarged left inguinal lymph node measuring 1.9  cm in short axis on series 4, image 207 with SUV max of hypermetabolic soft tissue nodules and masses of the left buttocks. Largest measures 7.7 x 4.1 cm on series 4, image 189 with SUV max of 18.5. Incidental CT findings: Simple appearing cyst of the left kidney, no further follow-up imaging is recommended for this lesion. Nonobstructing stone of the left kidney. Atherosclerotic disease of the abdominal aorta. Moderate fat containing umbilical hernia. Air is seen in the urinary bladder. SKELETON: No suspicious focal uptake to suggest osseous metastatic disease. Incidental CT findings: Prior total left hip arthroplasty with cerclage wires and surrounding hypermetabolic activity, similar to prior exam. Unchanged severe compression deformity at L1. Spinous process fractures with associated FDG uptake extending from approximately the T8-T1 levels. IMPRESSION: 1. New marked hypermetabolic soft tissue thickening of the right posterior nasopharynx. 2. New hypermetabolic subcentimeterright level IIb lymph node. 3. New hypermetabolic right lower lobe lung mass. 4. New enlarged and hypermetabolic left pelvic and left inguinal lymph nodes. 5. New hypermetabolic soft tissue nodules and masses of the left buttocks. 6. Enlarged lymph node located anterior to the right sternocleidomastoid muscle is unchanged in size and demonstrates similar FDG uptake, likely treated disease. 7. Spinous process fractures with associated FDG uptake extending from  approximately the T8-T1 levels. Correlate for history of recent trauma. 8. Air is seen in the urinary bladder, possibly due to recent instrumentation or infection. Correlate with urinalysis. Electronically Signed   By: Yetta Glassman M.D.   On: 03/02/2022 15:53   CT Head Wo Contrast  Result Date: 02/24/2022 CLINICAL DATA:  Head trauma. EXAM: CT HEAD WITHOUT CONTRAST TECHNIQUE: Contiguous axial images were obtained from the base of the skull through the vertex without intravenous contrast. RADIATION DOSE REDUCTION: This exam was performed according to the departmental dose-optimization program which includes automated exposure control, adjustment of the mA and/or kV according to patient size and/or use of iterative reconstruction technique. COMPARISON:  Head CT dated 11/08/2018. FINDINGS: Brain: Mild age-related atrophy and chronic microvascular ischemic changes. There is no acute intracranial hemorrhage. No mass effect or midline shift. No extra-axial fluid collection. Vascular: No hyperdense vessel or unexpected calcification. Skull: Normal. Negative for fracture or focal lesion. Sinuses/Orbits: The visualized paranasal sinuses and the left mastoid air cells are clear. Mild right mastoid effusions. Other: None IMPRESSION: 1. No acute intracranial pathology. 2. Mild age-related atrophy and chronic microvascular ischemic changes. Electronically Signed   By: Anner Crete M.D.   On: 02/24/2022 20:35    Microbiology: Results for orders placed or performed during the hospital encounter of 03/19/22  Culture, blood (Routine X 2) w Reflex to ID Panel     Status: None   Collection Time: 03/19/22 10:20 PM   Specimen: BLOOD  Result Value Ref Range Status   Specimen Description   Final    BLOOD RIGHT ANTECUBITAL Performed at [facility] [address], [place], [place] [address]    Special Requests   Final    BOTTLES DRAWN AEROBIC AND ANAEROBIC Blood Culture adequate volume Performed  at [facility] [address], [place], [place] [address]    Culture   Final    NO GROWTH 5 DAYS Performed at [facility], [place] [address], [place], [place] [address]    Report Status 03/25/2022 FINAL  Final  Culture, blood (Routine X 2) w Reflex to ID Panel     Status: None   Collection Time: 03/19/22 10:21 PM   Specimen: BLOOD  Result Value Ref Range Status   Specimen Description   Final    BLOOD LEFT ANTECUBITAL Performed at [facility] [address], [place], [place] [address]    Special Requests   Final    BOTTLES DRAWN AEROBIC ONLY Blood Culture adequate volume Performed at [facility] [address] [place], [place] [address]    Culture   Final    NO GROWTH 5 DAYS Performed at [facility], [place] [address] [place], [place] [address]    Report Status 03/25/2022 FINAL  Final  Urine Culture     Status: None   Collection Time: 03/20/22 12:55 AM   Specimen: In/Out Cath Urine  Result Value Ref Range Status   Specimen Description   Final    IN/OUT CATH URINE Performed at [facility] [address] [place], [place] [address]    Special Requests   Final    Immunocompromised Performed at [facility], [place] [address] [place], [place] [address]    Culture   Final    NO GROWTH Performed at [facility], [place] [address], [place], [place] [address]    Report Status 03/21/2022 FINAL  Final    Labs: CBC: Recent Labs  Lab 03/19/22 1233 03/20/22 0547 03/21/22 0608 03/24/22 0853 03/25/22 0220  WBC 8.4 7.0 6.0 8.9 8.1  NEUTROABS 5.7 4.9  --  8.3* 7.6  HGB 16.7 15.4 15.6 15.0 14.7  HCT 48.1 45.6 46.6 44.1 43.4  MCV 91.6 93.8 93.6 93.2 93.5  PLT 173 143* 142* 142* 148*    Basic Metabolic Panel: Recent Labs  Lab 03/20/22 0547 03/21/22 0608 03/22/22 0502 03/24/22 0853 03/25/22 0220  NA 138 138 138 138 138  K 3.8 3.7 3.5 4.4 4.7  CL 104 103 106  105 105  CO2 '25 26 24 25 26  '$ GLUCOSE 108* 101* 107* 147* 146*  BUN 41* 30* 25* 37* 47*  CREATININE 2.10* 1.32* 1.21 1.15 1.10  CALCIUM 10.1 9.6 9.1 9.0 9.1  MG 1.8  --   --  1.9 1.8  PHOS  --   --   --  3.1 2.9    Liver Function Tests: Recent Labs  Lab 03/19/22 1233 03/20/22 0547 03/24/22 0853 03/25/22 0220  AST 41 39 36 59*  ALT 32 29 35 57*  ALKPHOS 74 64 57 51  BILITOT 1.4* 1.8* 1.1 1.0  PROT 6.7 6.1* 6.1* 6.0*  ALBUMIN 3.7 3.1* 3.0* 2.9*    CBG: No results for input(s): "GLUCAP" in the last 168 hours.  Discharge time spent: greater than 30 minutes.  Signed: Raiford Noble, DO [facility] 03/26/2022

## 2022-03-26 NOTE — Progress Notes (Signed)
The patient is alert and oriented and has been seen by his physician. The orders for discharge were written. Peripheral IV has been removed. Port in right chest has been de-accessed by IV team. [name] over discharge instructions with patient. He is being discharged to [facility] via [facility] with all of his belongings.

## 2022-03-27 DIAGNOSIS — N401 Enlarged prostate with lower urinary tract symptoms: Secondary | ICD-10-CM | POA: Diagnosis not present

## 2022-03-27 DIAGNOSIS — J449 Chronic obstructive pulmonary disease, unspecified: Secondary | ICD-10-CM | POA: Diagnosis not present

## 2022-03-27 DIAGNOSIS — E039 Hypothyroidism, unspecified: Secondary | ICD-10-CM | POA: Diagnosis not present

## 2022-03-27 DIAGNOSIS — I4891 Unspecified atrial fibrillation: Secondary | ICD-10-CM | POA: Diagnosis not present

## 2022-03-27 DIAGNOSIS — N1831 Chronic kidney disease, stage 3a: Secondary | ICD-10-CM | POA: Diagnosis not present

## 2022-03-27 DIAGNOSIS — I5032 Chronic diastolic (congestive) heart failure: Secondary | ICD-10-CM | POA: Diagnosis not present

## 2022-03-27 DIAGNOSIS — N179 Acute kidney failure, unspecified: Secondary | ICD-10-CM | POA: Diagnosis not present

## 2022-03-27 DIAGNOSIS — C8510 Unspecified B-cell lymphoma, unspecified site: Secondary | ICD-10-CM | POA: Diagnosis not present

## 2022-03-27 DIAGNOSIS — I10 Essential (primary) hypertension: Secondary | ICD-10-CM | POA: Diagnosis not present

## 2022-03-29 ENCOUNTER — Inpatient Hospital Stay: Payer: Medicare PPO

## 2022-03-29 ENCOUNTER — Ambulatory Visit: Payer: Medicare PPO

## 2022-03-29 ENCOUNTER — Other Ambulatory Visit: Payer: Self-pay

## 2022-03-29 ENCOUNTER — Inpatient Hospital Stay: Payer: Medicare PPO | Admitting: Hematology

## 2022-03-29 VITALS — BP 98/66 | HR 60 | Temp 97.6°F | Resp 16

## 2022-03-29 DIAGNOSIS — E039 Hypothyroidism, unspecified: Secondary | ICD-10-CM | POA: Diagnosis not present

## 2022-03-29 DIAGNOSIS — C8339 Diffuse large B-cell lymphoma, extranodal and solid organ sites: Secondary | ICD-10-CM | POA: Diagnosis not present

## 2022-03-29 DIAGNOSIS — R627 Adult failure to thrive: Secondary | ICD-10-CM | POA: Diagnosis not present

## 2022-03-29 DIAGNOSIS — I5032 Chronic diastolic (congestive) heart failure: Secondary | ICD-10-CM | POA: Diagnosis not present

## 2022-03-29 DIAGNOSIS — C8330 Diffuse large B-cell lymphoma, unspecified site: Secondary | ICD-10-CM | POA: Diagnosis not present

## 2022-03-29 DIAGNOSIS — I10 Essential (primary) hypertension: Secondary | ICD-10-CM | POA: Diagnosis not present

## 2022-03-29 DIAGNOSIS — R4182 Altered mental status, unspecified: Secondary | ICD-10-CM | POA: Diagnosis not present

## 2022-03-29 DIAGNOSIS — I4891 Unspecified atrial fibrillation: Secondary | ICD-10-CM | POA: Diagnosis not present

## 2022-03-29 DIAGNOSIS — N1831 Chronic kidney disease, stage 3a: Secondary | ICD-10-CM | POA: Diagnosis not present

## 2022-03-29 DIAGNOSIS — Z5189 Encounter for other specified aftercare: Secondary | ICD-10-CM | POA: Diagnosis not present

## 2022-03-29 DIAGNOSIS — Z5112 Encounter for antineoplastic immunotherapy: Secondary | ICD-10-CM | POA: Diagnosis not present

## 2022-03-29 DIAGNOSIS — J449 Chronic obstructive pulmonary disease, unspecified: Secondary | ICD-10-CM | POA: Diagnosis not present

## 2022-03-29 DIAGNOSIS — Z7189 Other specified counseling: Secondary | ICD-10-CM

## 2022-03-29 DIAGNOSIS — Z87440 Personal history of urinary (tract) infections: Secondary | ICD-10-CM | POA: Diagnosis not present

## 2022-03-29 MED ORDER — SODIUM CHLORIDE 0.9 % IV SOLN
800.0000 mg/m2 | Freq: Once | INTRAVENOUS | Status: AC
  Administered 2022-03-29: 1976 mg via INTRAVENOUS
  Filled 2022-03-29: qty 51.97

## 2022-03-29 MED ORDER — ACETAMINOPHEN 325 MG PO TABS
650.0000 mg | ORAL_TABLET | Freq: Once | ORAL | Status: AC
  Administered 2022-03-29: 650 mg via ORAL
  Filled 2022-03-29: qty 2

## 2022-03-29 MED ORDER — HEPARIN SOD (PORK) LOCK FLUSH 100 UNIT/ML IV SOLN
500.0000 [IU] | Freq: Once | INTRAVENOUS | Status: AC | PRN
  Administered 2022-03-29: 500 [IU]

## 2022-03-29 MED ORDER — SODIUM CHLORIDE 0.9% FLUSH
10.0000 mL | INTRAVENOUS | Status: DC | PRN
  Administered 2022-03-29: 10 mL

## 2022-03-29 MED ORDER — DIPHENHYDRAMINE HCL 25 MG PO CAPS
50.0000 mg | ORAL_CAPSULE | Freq: Once | ORAL | Status: AC
  Administered 2022-03-29: 50 mg via ORAL
  Filled 2022-03-29: qty 2

## 2022-03-29 MED ORDER — PROCHLORPERAZINE MALEATE 10 MG PO TABS
10.0000 mg | ORAL_TABLET | Freq: Once | ORAL | Status: AC
  Administered 2022-03-29: 10 mg via ORAL
  Filled 2022-03-29: qty 1

## 2022-03-29 MED ORDER — SODIUM CHLORIDE 0.9 % IV SOLN: Freq: Once | INTRAVENOUS | Status: AC

## 2022-03-29 NOTE — Progress Notes (Incomplete)
HEMATOLOGY/ONCOLOGY CLINIC NOTE  Date of Service: 03/29/2022    Patient Care Team: Cassandria Anger, MD as PCP - General (Internal Medicine) Skeet Latch, MD as PCP - Cardiology (Cardiology) Brunetta Genera, MD as Consulting Physician (Hematology) McKenzie, Candee Furbish, MD as Consulting Physician (Urology) Jackquline Denmark, MD as Consulting Physician (Gastroenterology) Skeet Latch, MD as Attending Physician (Cardiology) Lynnell Dike, [place] as Consulting Physician (Optometry) Szabat, Darnelle Maffucci, [facility] (Inactive) as Pharmacist (Pharmacist) Harriett Sine, MD as Consulting Physician (Dermatology)  CHIEF COMPLAINTS/PURPOSE OF CONSULTATION:  Follow-up to discuss management of relapsed large B-cell lymphoma  HISTORY OF PRESENTING ILLNESS:  Please see previous note for details on initial presentation.   INTERVAL HISTORY  [name] is 76 y.o. male is here with his husband for follow-up of his labs large B-cell lymphoma to discuss further evaluation and management. He reports He is doing well with no new symptoms or concerns.  We discussed his recent CT head w/o contrast done 03/22/2022 which revealed no current evidence of disease progression at this time.  No prohibitive toxicities with the Dexamethasone 20 mg p.o daily at this time.  Patient is appropriate to proceed with cycle 2 of R GCD based on today's labs and labs done 5 days prior.  No new headaches.  No other new or acute focal symptoms.  Labs done 03/25/2022 were reviewed in detail.  MEDICAL HISTORY:  Past Medical History:  Diagnosis Date   Anticoagulant long-term use    xarelto--- takes for AFib,  managed by pcp, Dr Alain Marion   Benign localized prostatic hyperplasia with lower urinary tract symptoms (LUTS)    CAD (coronary artery disease) cardiologist--  dr t. Oval Linsey   hx of known CAD obstruction w/ collaterals (cath done @ [facility] 12-31-2011) ;   last cardiac cath 08-13-2019  showed sig. 2V CAD  involving proxLAD and CTO of the RCA/  chronic total occlusion midRCA w/ bridging and L>R collaterals   Diastolic CHF ([place])    dx [id number] hospital admission (followed by pcp)   Diffuse large B cell lymphoma [facility]) oncologist-- dr Irene Limbo--- in remission   dx 08/ 2019 -- bx left tonsill mass-- involving lymph nodes--- completed chemotherapy 10-12-2018   DJD (degenerative joint disease)    Dyspnea    Dysthymic disorder    Elevated brain natriuretic peptide (BNP) level    Environmental allergies    GAD (generalized anxiety disorder)    GERD (gastroesophageal reflux disease)    History of falling    multiple times   History of Graves' disease    1987 s/p total thyroidectomy   History of kidney stones    History of scabies    07/ 2014  resolved   History of syncope    per D/C note in epic 02-21-2013 ?vasovagal   History of vertebral compression fracture    05/ 2014 --  L1, no surgical intervention   HLD (hyperlipidemia)    Hyperlipidemia    Hypertension    Hypogonadism in male    IDA (iron deficiency anemia)    Lumbago    OA (osteoarthritis)    OSA on CPAP    cpap machine-settings 17   Osteomyelitis of right wrist ([place]) 11/02/2012   Osteoporosis    Severe   Osteoporosis with fracture 04/24/2013   Pathological fracture due to osteoporosis with delayed healing 04/24/2013   Persistent atrial fibrillation [facility])    cardiologist--- dr Alfonso Patten. [place]   Plaque psoriasis    dermatologist--- dr Harriett Sine--- currently  taking otezla   Post-surgical hypothyroidism    followed by pcp---  s/p total thyroidectomy for graves disease in 1987   Pure hypercholesterolemia 02/10/2021   Renal calculus, right    Right femoral fracture ([place]) 09/29/2012   Right forearm fracture 09/29/2012   Right tibial fracture 02/05/2013   Thrombocytopenia (H[facility])    Unsteady gait    uses walker   Wears hearing aid in both ears     SURGICAL HISTORY: Past Surgical History:  Procedure Laterality Date    APPENDECTOMY  2011 approx.    CARDIAC CATHETERIZATION  12-31-2011   dr j. Beatrix Fetters '@[facility]'$    normal LVF w/ multivessel CAD,  occluded RCA w/ colleterals   CATARACT EXTRACTION W/ INTRAOCULAR LENS  IMPLANT, BILATERAL  2016 approx.   COLONOSCOPY     CYSTOSCOPY WITH RETROGRADE PYELOGRAM, URETEROSCOPY AND STENT PLACEMENT Left 11/07/2017   Procedure: CYSTOSCOPY WITH RETROGRADE PYELOGRAM, URETEROSCOPY AND STENT PLACEMENT;  Surgeon: Cleon Gustin, MD;  Location: [facility];  Service: Urology;  Laterality: Left;   CYSTOSCOPY WITH RETROGRADE PYELOGRAM, URETEROSCOPY AND STENT PLACEMENT Right 10/22/2019   Procedure: CYSTOSCOPY WITH RETROGRADE PYELOGRAM, URETEROSCOPY AND STENT PLACEMENT;  Surgeon: Cleon Gustin, MD;  Location: [facility];  Service: Urology;  Laterality: Right;   CYSTOSCOPY/URETEROSCOPY/HOLMIUM LASER/STENT PLACEMENT Right 03/11/2017   Procedure: CYSTOSCOPY/URETEROSCOPYSTENT PLACEMENT right ureter retrograde pylegram;  Surgeon: Cleon Gustin, MD;  [facility];  Service: Urology;  Laterality: Right;   HARDWARE REMOVAL  10/05/2012   Procedure: HARDWARE REMOVAL;  Surgeon: [name], MD;  [facility];  Service: Orthopedics;  Laterality: Right;  REMOVING  STRYKER  GAMMA NAIL   HARDWARE REMOVAL Right 07/03/2013   Procedure: HARDWARE REMOVAL RIGHT TIBIA ;  Surgeon: Rozanna Box, MD;  Location: [place];  Service: Orthopedics;  Laterality: Right;   HIP CLOSED REDUCTION Right 10/15/2013   Procedure: CLOSED MANIPULATION HIP;  Surgeon: [name], MD;  [facility];  Service: Orthopedics;  Laterality: Right;   HOLMIUM LASER APPLICATION Right 02/11/6502   Procedure: HOLMIUM LASER APPLICATION;  Surgeon: Cleon Gustin, MD;  [facility];  Service: Urology;  Laterality: Right;   HOLMIUM LASER APPLICATION Left 5/46/5681   Procedure: HOLMIUM LASER APPLICATION;  Surgeon: Cleon Gustin, MD;  [facility];  Service: Urology;  Laterality: Left;   HOLMIUM  LASER APPLICATION Left 11/17/5168   Procedure: HOLMIUM LASER APPLICATION;  Surgeon: Cleon Gustin, MD;  [facility];  Service: Urology;  Laterality: Left;   HOLMIUM LASER APPLICATION Right 0/17/4944   Procedure: HOLMIUM LASER APPLICATION;  Surgeon: Cleon Gustin, MD;  Location: [facility];  Service: Urology;  Laterality: Right;   INCISION AND DRAINAGE HIP Right 11/16/2013   Procedure: IRRIGATION AND DEBRIDEMENT RIGHT HIP;  Surgeon: [name], MD;  [facility];  Service: Orthopedics;  Laterality: Right;   INTRAVASCULAR PRESSURE WIRE/FFR STUDY N/A 08/13/2019   Procedure: INTRAVASCULAR PRESSURE WIRE/FFR STUDY;  Surgeon: Nelva Bush, MD;  Location: [place] [facility];  Service: Cardiovascular;  Laterality: N/A;   IR IMAGING GUIDED PORT INSERTION  06/22/2018   IR NEPHROSTOMY PLACEMENT LEFT  03/28/2019   IR URETERAL STENT LEFT NEW ACCESS W/O SEP NEPHROSTOMY CATH  10/24/2017   IR URETERAL STENT LEFT NEW ACCESS W/O SEP NEPHROSTOMY CATH  03/26/2019   NEPHROLITHOTOMY Right 02/08/2017   Procedure: NEPHROLITHOTOMY PERCUTANEOUS WITH SURGEON ACCESS;  Surgeon: Cleon Gustin, MD;  [facility];  Service: Urology;  Laterality: Right;  NEPHROLITHOTOMY Left 10/24/2017   Procedure: NEPHROLITHOTOMY PERCUTANEOUS;  Surgeon: Cleon Gustin, MD;  [facility];  Service: Urology;  Laterality: Left;   NEPHROLITHOTOMY Left 03/26/2019   Procedure: NEPHROLITHOTOMY PERCUTANEOUS;  Surgeon: Cleon Gustin, MD;  [facility];  Service: Urology;  Laterality: Left;  2 HRS   NEPHROLITHOTOMY Left 04/17/2019   Procedure: NEPHROLITHOTOMY PERCUTANEOUS;  Surgeon: Cleon Gustin, MD;  [facility];  Service: Urology;  Laterality: Left;  2 HRS   ORIF TIBIA FRACTURE Right 02/06/2013   Procedure: OPEN REDUCTION INTERNAL FIXATION (ORIF) TIBIA FRACTURE WITH IM ROD FIBULA;  Surgeon: Rozanna Box, MD;  Location: [place];  Service: Orthopedics;  Laterality: Right;   ORIF  TIBIA FRACTURE Right 07/03/2013   Procedure: RIGHT TIBIA NON UNION REPAIR ;  Surgeon: Rozanna Box, MD;  Location: [place];  Service: Orthopedics;  Laterality: Right;   ORIF WRIST FRACTURE  10/02/2012   Procedure: OPEN REDUCTION INTERNAL FIXATION (ORIF) WRIST FRACTURE;  Surgeon: Roseanne Kaufman, MD;  [facility];  Service: Orthopedics;  Laterality: Right;  WITH   ANTIBIOTIC  CEMENT   ORIF WRIST FRACTURE Left 10/28/2013   Procedure: OPEN REDUCTION INTERNAL FIXATION (ORIF) WRIST FRACTURE with allograft;  Surgeon: Roseanne Kaufman, MD;  [facility];  Service: Orthopedics;  Laterality: Left;  DVR Plate   QUADRICEPS TENDON REPAIR Left 07/15/2017   Procedure: REPAIR QUADRICEP TENDON;  Surgeon: Frederik Pear, MD;  Location: [place];  Service: Orthopedics;  Laterality: Left;   RIGHT/LEFT HEART CATH AND CORONARY ANGIOGRAPHY N/A 08/13/2019   Procedure: RIGHT/LEFT HEART CATH AND CORONARY ANGIOGRAPHY;  Surgeon: Nelva Bush, MD;  Location: [place] [facility];  Service: Cardiovascular;  Laterality: N/A;   THYROIDECTOMY  02/1986   TOTAL HIP ARTHROPLASTY Right 03-16-2016   '@[facility]'$    TOTAL HIP REVISION  10/05/2012   Procedure: TOTAL HIP REVISION;  Surgeon: [name], MD;  [facility];  Service: Orthopedics;  Laterality: Right;  RIGHT TOTAL HIP REVISION   TOTAL HIP REVISION Right 09/17/2013   Procedure: REVISION RIGHT TOTAL HIP ARTHROPLASTY ;  Surgeon: [name], MD;  [facility];  Service: Orthopedics;  Laterality: Right;   TOTAL HIP REVISION Right 10/26/2013   Procedure: REVISION RIGHT TOTAL HIP ARTHROPLASTY;  Surgeon: [name], MD;  [facility];  Service: Orthopedics;  Laterality: Right;   TOTAL KNEE ARTHROPLASTY Bilateral right 03-15-2011;  left 06-30-2011   TOTAL KNEE REVISION Left 04/11/2017   Procedure: TOTAL KNEE REVISION PATELLA and TIBIA;  Surgeon: Frederik Pear, MD;  Location: [place];  Service: Orthopedics;  Laterality: Left;    SOCIAL HISTORY: Social History    Socioeconomic History   Marital status: Married    Spouse name: Not on file   Number of children: Not on file   Years of education: Not on file   Highest education level: Not on file  Occupational History   Not on file  Tobacco Use   Smoking status: Former    Packs/day: 1.00    Years: 50.00    Total pack years: 50.00    Types: Cigarettes    Quit date: 01/12/2012    Years since quitting: 10.2   Smokeless tobacco: Never  Vaping Use   Vaping Use: Never used  Substance and Sexual Activity   Alcohol use: Not Currently    Comment: occasional-social   Drug use: Never   Sexual activity: Yes  Other Topics Concern   Not on file  Social History Narrative  [place] 2.5 months, went home Feb 21st slipped and fell on back and developed.  Home PT/OT.  Just started outpatient physical therapy.  Friday night, misstepped.       Pt spouse verbalized pt had fallen 4 times within the last 24 hours     Social Determinants of Health   Financial Resource Strain: Low Risk  (01/07/2022)   Overall Financial Resource Strain (CARDIA)    Difficulty of Paying Living Expenses: Not hard at all  Food Insecurity: No Food Insecurity (01/07/2022)   Hunger Vital Sign    Worried About Running Out of Food in the Last Year: Never true    Ran Out of Food in the Last Year: Never true  Transportation Needs: No Transportation Needs (01/07/2022)   PRAPARE - Hydrologist (Medical): No    Lack of Transportation (Non-Medical): No  Physical Activity: Sufficiently Active (01/07/2022)   Exercise Vital Sign    Days of Exercise per Week: 3 days    Minutes of Exercise per Session: 60 min  Stress: No Stress Concern Present (01/07/2022)   [place]    Feeling of Stress : Not at all  Social Connections: [place] (01/07/2022)   Social Connection and Isolation Panel [NHANES]    Frequency of Communication with Friends and  Family: More than three times a week    Frequency of Social Gatherings with Friends and Family: More than three times a week    Attends Religious Services: 1 to 4 times per year    Active Member of [employer] or Organizations: No    Attends Music therapist: 1 to 4 times per year    Marital Status: Married  Human resources officer Violence: Not At Risk (01/07/2022)   Humiliation, Afraid, Rape, and Kick questionnaire    Fear of Current or Ex-Partner: No    Emotionally Abused: No    Physically Abused: No    Sexually Abused: No    FAMILY HISTORY: Family History  Problem Relation Age of Onset   CAD Father 63   Asthma Father    Alcohol abuse Father    Arthritis Mother    Alcohol abuse Sister     ALLERGIES:  is allergic to short ragweed pollen ext.  MEDICATIONS:  Current Outpatient Medications  Medication Sig Dispense Refill   acetaminophen (TYLENOL) 325 MG tablet Take 2 tablets (650 mg total) by mouth every 6 (six) hours as needed for mild pain (or Fever >/= 101). 20 tablet    albuterol (VENTOLIN HFA) 108 (90 Base) MCG/ACT inhaler Inhale 2 puffs into the lungs every 6 (six) hours as needed for wheezing or shortness of breath.     alfuzosin (UROXATRAL) 10 MG 24 hr tablet TAKE 1 TABLET AT BEDTIME (Patient taking differently: 10 mg at bedtime.) 90 tablet 3   allopurinol (ZYLOPRIM) 100 MG tablet Take 1 tablet (100 mg total) by mouth 2 (two) times daily. 60 tablet 0   amoxicillin (AMOXIL) 500 MG capsule Take 2,000 mg by mouth daily as needed (dental appointments).     aspirin EC 81 MG tablet Take 81 mg by mouth daily.     atorvastatin (LIPITOR) 80 MG tablet Take 80 mg by mouth daily.     buPROPion (WELLBUTRIN XL) 300 MG 24 hr tablet TAKE 1 TABLET EVERY DAY WITH BREAKFAST (Patient taking differently: Take 300 mg by mouth daily.) 90 tablet 3   Calcium Citrate-Vitamin D (CALCIUM CITRATE + D PO)  Take 1 tablet by mouth 2 (two) times daily. '1200mg'$  of Calcium and 1000units vitamin D3      Cholecalciferol (VITAMIN D-3 PO) Take 2,000 Units by mouth 2 (two) times daily.      clonazePAM (KLONOPIN) 1 MG tablet Take 1 tablet (1 mg total) by mouth every 6 (six) hours as needed for anxiety. Take 1 tablet (1 mg total) by mouth every 6 (six) hours as needed. for anxiety Strength: 1 mg 10 tablet 0   CRANBERRY PO Take 2 tablets by mouth daily.     denosumab (PROLIA) 60 MG/ML SOSY injection Inject 60 mg as directed every 6 (six) months. Last injection: Feb 2020 Next injection: August 2020     dexamethasone (DECADRON) 4 MG tablet Take 5 tablets (20 mg total) by mouth daily. Start the day after carboplatin chemotherapy for 3 days. 30 tablet 3   diltiazem (CARDIZEM CD) 240 MG 24 hr capsule TAKE 1 CAPSULE EVERY DAY (Patient taking differently: Take 240 mg by mouth daily.) 90 capsule 3   Docusate Sodium 100 MG capsule Take 100 mg by mouth 2 (two) times daily.     DULoxetine (CYMBALTA) 60 MG capsule TAKE 1 CAPSULE TWICE DAILY (Patient taking differently: Take 60 mg by mouth 2 (two) times daily.) 180 capsule 3   EPINEPHrine 0.3 mg/0.3 mL IJ SOAJ injection Inject 0.3 mg into the muscle as needed for anaphylaxis.     ezetimibe (ZETIA) 10 MG tablet TAKE 1 TABLET EVERY DAY 90 tablet 2   feeding supplement (ENSURE ENLIVE / ENSURE PLUS) LIQD Take 237 mLs by mouth 2 (two) times daily between meals. 237 mL 12   ferrous sulfate 325 (65 FE) MG tablet Take 325 mg by mouth 2 (two) times daily with a meal.      hydrochlorothiazide (HYDRODIURIL) 12.5 MG tablet TAKE 1 TABLET (12.5 MG TOTAL) BY MOUTH DAILY. 90 tablet 3   HYDROcodone-acetaminophen (NORCO) 7.5-325 MG tablet Take 1 tablet by mouth every 6 (six) hours as needed for severe pain. 10 tablet 0   isosorbide mononitrate (IMDUR) 30 MG 24 hr tablet TAKE 1 TABLET (30 MG TOTAL) BY MOUTH DAILY. 90 tablet 2   levothyroxine (SYNTHROID) 100 MCG tablet Take 100 mcg by mouth daily before breakfast.     loratadine (CLARITIN) 10 MG tablet Take 10 mg by mouth daily.       losartan (COZAAR) 50 MG tablet TAKE 1 TABLET EVERY DAY (Patient taking differently: Take 50 mg by mouth daily.) 90 tablet 2   Magnesium 500 MG CAPS Take 1 capsule (500 mg total) by mouth daily. (Patient taking differently: Take 500 mg by mouth every evening.) 30 capsule 1   metoprolol tartrate (LOPRESSOR) 25 MG tablet Take 1 tablet (25 mg total) by mouth 2 (two) times daily. 180 tablet 3   Multiple Vitamin (MULTIVITAMIN WITH MINERALS) TABS tablet Take 1 tablet by mouth daily. Men's One-A-Day 50+     MYRBETRIQ 25 MG TB24 tablet TAKE 1 TABLET EVERY DAY (Patient taking differently: Take 25 mg by mouth daily.) 90 tablet 3   nitroGLYCERIN (NITROSTAT) 0.4 MG SL tablet Place 0.4 mg under the tongue every 5 (five) minutes as needed for chest pain. x3 doses as needed for chest pain     omeprazole (PRILOSEC) 40 MG capsule TAKE 1 CAPSULE EVERY DAY BEFORE BREAKFAST (Patient taking differently: Take 40 mg by mouth daily. TAKE 1 CAPSULE EVERY DAY BEFORE BREAKFAST) 90 capsule 3   ondansetron (ZOFRAN) 8 MG tablet Take 1 tablet (8 mg  total) by mouth 2 (two) times daily as needed. Start on the 3rd day after cisplatin chemotherapy. 30 tablet 1   Potassium Citrate 15 MEQ (1620 MG) TBCR TAKE 1 TABLET TWICE DAILY (Patient taking differently: Take 1 tablet by mouth in the morning and at bedtime.) 180 tablet 3   Probiotic Product (PROBIOTIC PO) Take 1 capsule by mouth daily with breakfast.     prochlorperazine (COMPAZINE) 10 MG tablet Take 1 tablet (10 mg total) by mouth every 6 (six) hours as needed (Nausea or vomiting). 30 tablet 1   rivaroxaban (XARELTO) 10 MG TABS tablet Take 1 tablet (10 mg total) by mouth daily. 90 tablet 3   solifenacin (VESICARE) 10 MG tablet Take 10 mg by mouth daily.     testosterone enanthate (DELATESTRYL) 200 MG/ML injection INJECT 1ML ('200MG'$ ) INTO THE MUSCLE EVERY 14 DAYS. DISCARD VIAL AFTER 28 DAYS. (Patient taking differently: 200 mg every 14 (fourteen) days. INJECT 1ML ('200MG'$ ) INTO THE MUSCLE  EVERY 14 DAYS. DISCARD VIAL AFTER 28 DAYS.) 5 mL 3   vitamin C (ASCORBIC ACID) 500 MG tablet Take 500 mg by mouth daily.     No current facility-administered medications for this visit.    REVIEW OF SYSTEMS:   Fall with injury to the left anterior forehead   PHYSICAL EXAMINATION:  ECOG FS:3 - Symptomatic, >50% confined to bed  There were no vitals filed for this visit.  Wt Readings from Last 3 Encounters:  03/26/22 255 lb 3.2 oz (115.8 kg)  03/19/22 251 lb 11.2 oz (114.2 kg)  03/06/22 250 lb 14.1 oz (113.8 kg)    There is no height or weight on file to calculate BMI.   NAD GENERAL:alert, in no acute distress and comfortable SKIN: no acute rashes, no significant lesions EYES: conjunctiva are pink and non-injected, sclera anicteric NECK: supple, no JVD LYMPH:  no palpable lymphadenopathy in the cervical, axillary or inguinal regions LUNGS: clear to auscultation b/l with normal respiratory effort HEART: regular rate & rhythm ABDOMEN:  normoactive bowel sounds , non tender, not distended. Extremity: no pedal edema PSYCH: alert & oriented x 3 with fluent speech NEURO: no focal motor/sensory deficits  Exam performed in chair.  LABORATORY DATA:  I have reviewed the data as listed     Latest Ref Rng & Units 03/25/2022    2:20 AM 03/24/2022    8:53 AM 03/21/2022    6:08 AM  CBC  WBC 4.0 - 10.5 K/uL 8.1  8.9  6.0   Hemoglobin 13.0 - 17.0 g/dL 14.7  15.0  15.6   Hematocrit 39.0 - 52.0 % 43.4  44.1  46.6   Platelets 150 - 400 K/uL 148  142  142        Latest Ref Rng & Units 03/25/2022    2:20 AM 03/24/2022    8:53 AM 03/22/2022    5:02 AM  CMP  Glucose 70 - 99 mg/dL 146  147  107   BUN 8 - 23 mg/dL 47  37  25   Creatinine 0.61 - 1.24 mg/dL 1.10  1.15  1.21   Sodium 135 - 145 mmol/L 138  138  138   Potassium 3.5 - 5.1 mmol/L 4.7  4.4  3.5   Chloride 98 - 111 mmol/L 105  105  106   CO2 22 - 32 mmol/L '26  25  24   '$ Calcium 8.9 - 10.3 mg/dL 9.1  9.0  9.1   Total Protein 6.5  - 8.1 g/dL 6.0  6.1    Total Bilirubin 0.3 - 1.2 mg/dL 1.0  1.1    Alkaline Phos 38 - 126 U/L 51  57    AST 15 - 41 U/L 59  36    ALT 0 - 44 U/L 57  35     . Lab Results  Component Value Date   LDH 331 (H) 03/20/2022   06/28/2019 ECHOCARDIOGRAM COMPLETE (Accession [id number])   05/29/18 Left tonsil biopsy:   RADIOGRAPHIC STUDIES: I have personally reviewed the radiological images as listed and agreed with the findings in the report.   .CT HEAD WO CONTRAST (5MM)  Result Date: 03/22/2022 CLINICAL DATA:  Dizziness, persistent/recurrent, cardiac or vascular cause suspected EXAM: CT HEAD WITHOUT CONTRAST TECHNIQUE: Contiguous axial images were obtained from the base of the skull through the vertex without intravenous contrast. RADIATION DOSE REDUCTION: This exam was performed according to the departmental dose-optimization program which includes automated exposure control, adjustment of the mA and/or kV according to patient size and/or use of iterative reconstruction technique. COMPARISON:  MRI head Feb 27, 2022. FINDINGS: Brain: No evidence of acute infarction, hemorrhage, hydrocephalus, extra-axial collection or mass lesion/mass effect. Patchy white matter hypodensities, nonspecific but compatible with chronic microvascular disease. Vascular: No hyperdense vessel identified. Skull: No acute fracture. Sinuses/Orbits: Clear sinuses.  No acute orbital findings. Other: Incompletely imaged known nasopharyngeal mass. Small right mastoid effusion. IMPRESSION: 1. No evidence of acute intracranial abnormality. 2. Incompletely imaged known nasopharyngeal mass. Electronically Signed   By: Margaretha [place] M.D.   On: 03/22/2022 11:20   [place] CORE BIOPSY (SOFT TISSUE)  Result Date: 03/17/2022 INDICATION: History of DLBCL with hypermetabolic LEFT gluteal/flank mass EXAM: ULTRASOUND-GUIDED LEFT GLUTEAL SOFT TISSUE MASS BIOPSY COMPARISON:  CT AP, 03/06/2022.  PET-CT, 03/02/2022 MEDICATIONS: None  ANESTHESIA/SEDATION: Local anesthetic and single agent sedation was employed during this procedure. A total of fentanyl 50 mcg was administered intravenously. The patient's level of consciousness and vital signs were monitored continuously by radiology nursing throughout the procedure under my direct supervision. COMPLICATIONS: None immediate. TECHNIQUE: Informed written consent was obtained from the patient and/or patient's representative after a discussion of the risks, benefits and alternatives to treatment. Questions regarding the procedure were encouraged and answered. Initial ultrasound scanning demonstrated hypervascular subcutaneous LEFT gluteal soft tissue mass. An ultrasound image was saved for documentation purposes. The procedure was planned. A timeout was performed prior to the initiation of the procedure. The operative was prepped and draped in the usual sterile fashion, and a sterile drape was applied covering the operative field. A timeout was performed prior to the initiation of the procedure. Local anesthesia was provided with 1% lidocaine with epinephrine. Under direct ultrasound guidance, an 18 gauge core needle device was utilized to obtain to obtain 4 core needle biopsies of the LEFT gluteal soft tissue mass. The samples were placed in saline and submitted to pathology. The needle was removed and hemostasis was achieved with manual compression. Post procedure scan was negative for significant hematoma. A dressing was placed. The patient tolerated the procedure well without immediate postprocedural complication. IMPRESSION: Successful ultrasound guided biopsy of LEFT gluteal soft tissue mass, as above. Michaelle Birks, MD [facility] Electronically Signed   By: Michaelle Birks M.D.   On: 03/17/2022 09:32   MR BRAIN W WO CONTRAST  Result Date: 03/07/2022 CLINICAL DATA:  Brain metastases suspected EXAM: MRI HEAD WITHOUT AND WITH CONTRAST  TECHNIQUE: Multiplanar, multiecho pulse sequences of the brain and surrounding structures were obtained without  and with intravenous contrast. CONTRAST:  54m GADAVIST GADOBUTROL 1 MMOL/ML IV SOLN COMPARISON:  PET CT from 5 days ago FINDINGS: Brain: No enhancement or swelling to suggest metastatic disease. Small FLAIR hyperintensities in the cerebral white matter attributed to chronic small vessel ischemia. No acute infarct, hydrocephalus, or collection. Cerebral volume loss which is generalized. Vascular: Slow flow in the right transverse and sigmoid dural sinuses when comparing T2 and postcontrast imaging, attributed to tumoral compression in the right neck. Skull and upper cervical spine: No skull base erosion or visible bony metastasis. Sinuses/Orbits: Right mastoid opacification associated with a nasopharyngeal mass which is right eccentric. Other: Nasopharyngeal mass crossing midline but bulky towards the right with length of 6 cm and thickness of 18 mm. There is contact of the carotid and parapharyngeal spaces on the right. No evidence of intracranial extension IMPRESSION: 1. 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 No skull base erosion or intracranial extension. 2. Right mastoid effusion. Electronically Signed   By: JJorje GuildM.D.   On: 03/07/2022 11:53   CT ABDOMEN PELVIS W CONTRAST  Result Date: 03/06/2022 CLINICAL DATA:  Urinary frequency. Undergoing treatment for urinary tract infection without improvement. Clinical concern for renal mass or abscess. Undergoing chemotherapy for diffuse large B cell lymphoma. EXAM: CT ABDOMEN AND PELVIS WITH CONTRAST TECHNIQUE: Multidetector CT imaging of the abdomen and pelvis was performed using the standard protocol following bolus administration of intravenous contrast. RADIATION DOSE REDUCTION: This exam was performed according to the departmental dose-optimization program which includes automated exposure  control, adjustment of the mA and/or kV according to patient size and/or use of iterative reconstruction technique. CONTRAST:  1056mOMNIPAQUE IOHEXOL 300 MG/ML  SOLN COMPARISON:  PET-CT dated 03/02/2022 and abdomen and pelvis CT dated 09/05/2019. FINDINGS: Lower chest: The 4.3 x 3.9 cm right lower lobe lung mass seen on 03/02/2022 currently measures 4.7 x 4.4 cm on image number 15/4. There is interval visualization of a 5 mm similar appearing nodule adjacent to this mass posteriorly and superiorly on image number 13/4. There is also bronchial mucous plugging at the posterior, inferior aspect of the mass, without significant change. The heart remains mildly enlarged. Minimal pericardial effusion with a maximum thickness of 6 mm. Atheromatous calcifications, including the coronary arteries and aorta. Hepatobiliary: Unremarkable liver. Mild sludge or noncalcified gallstones in the dependent portion of the gallbladder. No gallbladder wall thickening or pericholecystic fluid. Pancreas: Moderate diffuse pancreatic atrophy. Spleen: Normal in size without focal abnormality. Adrenals/Urinary Tract: Low-density right adrenal nodule with medium density thin internal septations in the superior aspect of the right adrenal gland, measuring 2.9 x 1.5 cm on coronal image number 47, without significant change since 09/05/2019. No increased activity on the recent PET-CT. Unremarkable left adrenal gland. Tiny lower pole right renal calculus. 4 mm mid left renal calculus. Small mid left renal cyst. No bladder or ureteral calculi no hydronephrosis. The distal right ureter is obscured by streak artifacts from patient's right hip prosthesis. Stomach/Bowel: Distended rectum containing stool and gas. Moderate stool throughout the remainder of the colon. Unremarkable stomach and small bowel. The appendix is not visualized and surgically absent by history. Vascular/Lymphatic: Atheromatous arterial calcifications without aneurysm. Enlarged,  recently hypermetabolic left inguinal lymph node with a short axis diameter 19 mm on image number 98/2. Multiple additional enlarged left pelvic lymph nodes, including a common femoral node with a short axis diameter of 31 mm on image number 84/2 and no obturator node with a short axis  diameter of 27 mm on image number 81/2. Reproductive: Prostate is unremarkable. Other: Small to moderate-sized ventral hernia containing fat small to moderate-sized infraumbilical hernia containing a small to moderate-sized supraumbilical hernia containing fat without change. Small left inguinal hernia containing fat. Musculoskeletal: Right hip prosthesis with associated streak artifact. Severe L1 vertebral compression deformity with bony retropulsion causing moderate to marked canal stenosis without significant change. Associated acute kyphosis without significant change. No acute fractures or acute subluxations. Mild anterior spur formation at multiple levels in the lower thoracic spine and moderate to large anterior spurs at the anterior T12 and L2 levels on both sides of the severely compressed L1 vertebra. Stable anterior subluxation of the lumbar spine inferior to the L1 fracture relative to the spine above the fracture. Marked right psoas, iliacus and iliopsoas muscle atrophy. IMPRESSION: 1. No renal mass or abscess. 2. Small, nonobstructing calculus in each kidney. 3. Mild increase in size of the previously demonstrated right lower lobe hypermetabolic mass with an interval small satellite nodule posteriorly. This is concerning for a primary lung neoplasm or lymphoma involving the lung. 4. Recently demonstrated left pelvic and inguinal adenopathy, most likely due to the patient's known lymphoma. 5. Stable small to moderate-sized supraumbilical hernia containing herniated fat. 6. Stable right adrenal probable adenoma. Electronically Signed   By: Claudie Revering M.D.   On: 03/06/2022 16:39   DG Chest Port 1 View  Result Date:  03/06/2022 CLINICAL DATA:  Possible sepsis. EXAM: PORTABLE CHEST 1 VIEW COMPARISON:  10/13/2021. FINDINGS: Cardiac silhouette is normal in size. No mediastinal or hilar masses. Right anterior chest wall Port-A-Cath is stable, tip in the mid superior vena cava. Chronic prominence of the interstitial markings bilaterally. Lungs otherwise clear. No pleural effusion or pneumothorax. Skeletal structures are grossly intact. IMPRESSION: No active disease. Electronically Signed   By: Lajean Manes M.D.   On: 03/06/2022 15:04   NM PET Image Restag (PS) Skull Base To Thigh  Result Date: 03/02/2022 CLINICAL DATA:  Follow-up treatment strategy for diffuse large B-cell lymphoma. EXAM: NUCLEAR MEDICINE PET SKULL BASE TO THIGH TECHNIQUE: 0.8 mCi F-18 FDG was injected intravenously. Full-ring PET imaging was performed from the skull base to thigh after the radiotracer. CT data was obtained and used for attenuation correction and anatomic localization. Fasting blood glucose: 107 mg/dl COMPARISON:  PET-CT dated November 22, 2018; PET-CT dated August 22, 2018 FINDINGS: Mediastinal blood pool activity: SUV max 2.8 Liver activity: SUV max 3.8 NECK: New marked hypermetabolic soft tissue thickening of the right posterior nasopharynx with SUV max of 20.5. Large lymph node located anterior to the right sternocleidomastoid muscle unchanged in size, measuring 2.2 cm in short axis, and demonstrates similar mild FDG uptake, SUV max of 2.6, previously 2.4. New hypermetabolic subcentimeter right level IIb lymph node measuring 5 mm on series 4, image 34. Incidental CT findings: none CHEST: New hypermetabolic right lower lobe lung mass measuring 3.9 x 4.3 cm with SUV max of 12.4. Incidental CT findings: Left main and three-vessel coronary artery calcifications. Atherosclerotic disease of the thoracic aorta. Right chest wall port with tip in the mid SVC. ABDOMEN/PELVIS: Enlarged hypermetabolic left pelvic and left inguinal lymph nodes.  Reference left pelvic sidewall lymph node measuring 2.8 cm in short axis on series 4, image 190 with SUV max of 33.5. Enlarged left inguinal lymph node measuring 1.9 cm in short axis on series 4, image 207 with SUV max of hypermetabolic soft tissue nodules and masses of the left buttocks. Largest measures 7.7 x  4.1 cm on series 4, image 189 with SUV max of 18.5. Incidental CT findings: Simple appearing cyst of the left kidney, no further follow-up imaging is recommended for this lesion. Nonobstructing stone of the left kidney. Atherosclerotic disease of the abdominal aorta. Moderate fat containing umbilical hernia. Air is seen in the urinary bladder. SKELETON: No suspicious focal uptake to suggest osseous metastatic disease. Incidental CT findings: Prior total left hip arthroplasty with cerclage wires and surrounding hypermetabolic activity, similar to prior exam. Unchanged severe compression deformity at L1. Spinous process fractures with associated FDG uptake extending from approximately the T8-T1 levels. IMPRESSION: 1. New marked hypermetabolic soft tissue thickening of the right posterior nasopharynx. 2. New hypermetabolic subcentimeterright level IIb lymph node. 3. New hypermetabolic right lower lobe lung mass. 4. New enlarged and hypermetabolic left pelvic and left inguinal lymph nodes. 5. New hypermetabolic soft tissue nodules and masses of the left buttocks. 6. Enlarged lymph node located anterior to the right sternocleidomastoid muscle is unchanged in size and demonstrates similar FDG uptake, likely treated disease. 7. Spinous process fractures with associated FDG uptake extending from approximately the T8-T1 levels. Correlate for history of recent trauma. 8. Air is seen in the urinary bladder, possibly due to recent instrumentation or infection. Correlate with urinalysis. Electronically Signed   By: Yetta Glassman M.D.   On: 03/02/2022 15:53    ASSESSMENT & PLAN:  76 y.o. male with  1. Diffuse Large  B-Cell Lymphoma,- Stage III Left tonsil -05/29/18 Left tonsil biopsy which revealed Diffuse Large B-Cell Lymphoma, which requires treatment  -06/07/18 LDH at 214, will monitor change through treatment.  -Baseline ECHO 06/14/18: EF 50-55% -Standard regimen of R-CHOP with G-CSF support for 6 cycles starting 06/23/18  06/16/18 PET/CT revealed 1. Deauville 5 hypermetabolic lesions in the palatine tonsils, left internal jugular chain, and involving a right inguinal lymph node. Deauville 4 involvement of a left inguinal lymph node and an enlarged subcutaneous lesion favoring lymph node superficial to the lower right sternocleidomastoid muscle in the neck. 2. Other imaging findings of potential clinical significance: Aortic Atherosclerosis. Coronary atherosclerosis. Emphysema. Bilateral nonobstructive nephrolithiasis. Umbilical hernia contains adipose tissues. Old right rib fractures some of which are nonunited. 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   08/22/18 PET/CT revealed Interval response to therapy. Overall Deauville criteria 3. Interval decrease in size and FDG uptake associated with palatine tonsil lesions. There has been resolution of previous left level-II and bilateral inguinal lymph nodes. 2. Enlarged subcutaneous lesion superficial to the lower right sternocleidomastoid muscle exhibits decreased FDG uptake. Currently Deauville criteria 2. 3. No new or progressive sites of disease identified. 4.  Aortic Atherosclerosis. 5. Multi vessel coronary artery atherosclerotic calcifications. 6. Unchanged vertebra plana deformity. 7. Kidney stones.   S/p 6 cycles of R-CHOP completed on 10/12/18  11/22/18 PET/CT revealed The tonsillar mass has essentially resolved. The large presumed lymph node superficial to the right sternocleidomastoid is stable in size at 2.3 cm in short axis, with maximum SUV of 3.3 (formerly 2.4), representing Deauville 3 activity, previously Deauville 2. 2. Other imaging findings of potential clinical  significance: Aortic Atherosclerosis and Emphysema. Coronary atherosclerosis. Bilateral nonobstructive nephrolithiasis. Umbilical hernia contains adipose tissue. Chronic high activity posterior to the right hip implant along a region of bony irregularity and deformity probably reflecting chronic inflammation. L1 vertebra plana.  2.  Recurrent large B-cell lymphoma with left buttock soft tissue mass, pulmonary mass, widespread lymphadenopathy  #3 acute on chronic renal insufficiency likely from dehydration cannot rule out urinary retention given patient's chronic  bladder issues.  Patient follows with Dr. Junious Silk of urology  #4 altered mental status and failure to thrive in the context of poor p.o. intake and recent recurrent UTI  Recent MRI of the brain showed no evidence of lymphoma involvement  Plan -Labs done 03/25/2022 were discussed in detail. CBC and CMP stable. Magnesium and phosphorus are WNL. -Patient has failure to thrive with decreased p.o. intake and inability get up the steps into his house and recurrent falls with some injury to his left forehead. -Monitoring for delirium. -If patient has cognitive issues despite control of infection might need to consider lumbar puncture. -Recent CT head w/o contrast done 03/22/2022 revealed no evidence of acute intracranial abnormality at this time. -No prohibitive toxicities with the Dexamethasone 20 mg p.o daily at this time. -Continue dexamethasone 20 mg p.o. daily -Patient is appropriate to proceed with cycle 2 of R GCD based on today's labs and labs done 5 days prior.   FOLLOW UP: Please schedule for cycle 2 of R GCD as per orders. Port flush labs and MD visit with cycle 2-day 1 of treatment  The total time spent in the appointment was *** minutes* All of the patient's questions were answered with apparent satisfaction. The patient knows to call the clinic with any problems, questions or concerns.   Sullivan Lone MD MS AAHIVMS [facility]  [facility] Hematology/Oncology Physician [facility]  .*Total Encounter Time as defined by the Centers for Medicare and Medicaid Services includes, in addition to the face-to-face time of a patient visit (documented in the note above) non-face-to-face time: obtaining and reviewing outside history, ordering and reviewing medications, tests or procedures, care coordination (communications with other health care professionals or caregivers) and documentation in the medical record.  I, Melene Muller, am acting as scribe for Dr. Sullivan Lone, MD.

## 2022-03-29 NOTE — Patient Instructions (Signed)
[facility] MEDICAL ONCOLOGY   Discharge Instructions: Thank you for choosing [facility] to provide your oncology and hematology care.   If you have a lab appointment with the Cancer Center, please go directly to the Cancer Center and check in at the registration area.   Wear comfortable clothing and clothing appropriate for easy access to any Portacath or PICC line.   We strive to give you quality time with your provider. You may need to reschedule your appointment if you arrive late (15 or more minutes).  Arriving late affects you and other patients whose appointments are after yours.  Also, if you miss three or more appointments without notifying the office, you may be dismissed from the clinic at the provider's discretion.      For prescription refill requests, have your pharmacy contact our office and allow 72 hours for refills to be completed.    Today you received the following chemotherapy and/or immunotherapy agents: gemcitabine      To help prevent nausea and vomiting after your treatment, we encourage you to take your nausea medication as directed.  BELOW ARE SYMPTOMS THAT SHOULD BE REPORTED IMMEDIATELY: *FEVER GREATER THAN 100.4 F (38 C) OR HIGHER *CHILLS OR SWEATING *NAUSEA AND VOMITING THAT IS NOT CONTROLLED WITH YOUR NAUSEA MEDICATION *UNUSUAL SHORTNESS OF BREATH *UNUSUAL BRUISING OR BLEEDING *URINARY PROBLEMS (pain or burning when urinating, or frequent urination) *BOWEL PROBLEMS (unusual diarrhea, constipation, pain near the anus) TENDERNESS IN MOUTH AND THROAT WITH OR WITHOUT PRESENCE OF ULCERS (sore throat, sores in mouth, or a toothache) UNUSUAL RASH, SWELLING OR PAIN  UNUSUAL VAGINAL DISCHARGE OR ITCHING   Items with * indicate a potential emergency and should be followed up as soon as possible or go to the Emergency Department if any problems should occur.  Please show the CHEMOTHERAPY ALERT CARD or IMMUNOTHERAPY ALERT CARD at check-in  to the Emergency Department and triage nurse.  Should you have questions after your visit or need to cancel or reschedule your appointment, please contact [facility] MEDICAL ONCOLOG[facility]  Dept: [phone]  and follow the prompts.  Office hours are 8:00 a.m. to 4:30 p.m. Monday - Friday. Please note that voicemails left after 4:00 p.m. may not be returned until the following business day.  We are closed weekends and major holidays. You have access to a nurse at all times for urgent questions. Please call the main number to the clinic Dept: [phone] and follow the prompts.   For any non-urgent questions, you may also contact your provider using MyChart. We now offer e-Visits for anyone 18 and older to request care online for non-urgent symptoms. For details visit [email or web address].   Also download the MyChart app! Go to the app store, search "MyChart", open the app, select [facility], and log in with your MyChart username and password.  Masks are optional in the cancer centers. If you would like for your care team to wear a mask while they are taking care of you, please let them know. For doctor visits, patients may have with them one support person who is at least 77 years old. At this time, visitors are not allowed in the infusion area. 

## 2022-03-30 ENCOUNTER — Inpatient Hospital Stay: Payer: Medicare PPO

## 2022-03-30 VITALS — BP 98/50 | HR 62 | Temp 98.3°F | Resp 18

## 2022-03-30 DIAGNOSIS — Z5112 Encounter for antineoplastic immunotherapy: Secondary | ICD-10-CM | POA: Diagnosis not present

## 2022-03-30 DIAGNOSIS — R627 Adult failure to thrive: Secondary | ICD-10-CM | POA: Diagnosis not present

## 2022-03-30 DIAGNOSIS — N1831 Chronic kidney disease, stage 3a: Secondary | ICD-10-CM | POA: Diagnosis not present

## 2022-03-30 DIAGNOSIS — C8330 Diffuse large B-cell lymphoma, unspecified site: Secondary | ICD-10-CM

## 2022-03-30 DIAGNOSIS — Z87440 Personal history of urinary (tract) infections: Secondary | ICD-10-CM | POA: Diagnosis not present

## 2022-03-30 DIAGNOSIS — Z7189 Other specified counseling: Secondary | ICD-10-CM

## 2022-03-30 DIAGNOSIS — C8339 Diffuse large B-cell lymphoma, extranodal and solid organ sites: Secondary | ICD-10-CM | POA: Diagnosis not present

## 2022-03-30 DIAGNOSIS — Z5189 Encounter for other specified aftercare: Secondary | ICD-10-CM | POA: Diagnosis not present

## 2022-03-30 DIAGNOSIS — R4182 Altered mental status, unspecified: Secondary | ICD-10-CM | POA: Diagnosis not present

## 2022-03-30 MED ORDER — PEGFILGRASTIM-CBQV 6 MG/0.6ML ~~LOC~~ SOSY
6.0000 mg | PREFILLED_SYRINGE | Freq: Once | SUBCUTANEOUS | Status: AC
  Administered 2022-03-30: 6 mg via SUBCUTANEOUS
  Filled 2022-03-30: qty 0.6

## 2022-03-31 ENCOUNTER — Telehealth: Payer: Self-pay | Admitting: Hematology

## 2022-03-31 DIAGNOSIS — I4891 Unspecified atrial fibrillation: Secondary | ICD-10-CM | POA: Diagnosis not present

## 2022-03-31 DIAGNOSIS — N179 Acute kidney failure, unspecified: Secondary | ICD-10-CM | POA: Diagnosis not present

## 2022-03-31 DIAGNOSIS — C8510 Unspecified B-cell lymphoma, unspecified site: Secondary | ICD-10-CM | POA: Diagnosis not present

## 2022-03-31 NOTE — Telephone Encounter (Signed)
Scheduled follow-up appointments per 6/19 los. Patient's husband is aware.

## 2022-03-31 NOTE — Telephone Encounter (Signed)
Rescheduled upcoming appointment due to provider's schedule. Patient's husband is aware of changes.

## 2022-04-01 DIAGNOSIS — L98419 Non-pressure chronic ulcer of buttock with unspecified severity: Secondary | ICD-10-CM | POA: Diagnosis not present

## 2022-04-04 ENCOUNTER — Encounter: Payer: Self-pay | Admitting: Hematology

## 2022-04-05 DIAGNOSIS — J449 Chronic obstructive pulmonary disease, unspecified: Secondary | ICD-10-CM | POA: Diagnosis not present

## 2022-04-05 DIAGNOSIS — I10 Essential (primary) hypertension: Secondary | ICD-10-CM | POA: Diagnosis not present

## 2022-04-05 DIAGNOSIS — I5032 Chronic diastolic (congestive) heart failure: Secondary | ICD-10-CM | POA: Diagnosis not present

## 2022-04-05 DIAGNOSIS — I4891 Unspecified atrial fibrillation: Secondary | ICD-10-CM | POA: Diagnosis not present

## 2022-04-06 DIAGNOSIS — L98419 Non-pressure chronic ulcer of buttock with unspecified severity: Secondary | ICD-10-CM | POA: Diagnosis not present

## 2022-04-06 DIAGNOSIS — J3081 Allergic rhinitis due to animal (cat) (dog) hair and dander: Secondary | ICD-10-CM | POA: Diagnosis not present

## 2022-04-06 DIAGNOSIS — J301 Allergic rhinitis due to pollen: Secondary | ICD-10-CM | POA: Diagnosis not present

## 2022-04-06 DIAGNOSIS — J3089 Other allergic rhinitis: Secondary | ICD-10-CM | POA: Diagnosis not present

## 2022-04-08 ENCOUNTER — Other Ambulatory Visit: Payer: Self-pay

## 2022-04-08 DIAGNOSIS — C8330 Diffuse large B-cell lymphoma, unspecified site: Secondary | ICD-10-CM

## 2022-04-09 DIAGNOSIS — I10 Essential (primary) hypertension: Secondary | ICD-10-CM | POA: Diagnosis not present

## 2022-04-09 DIAGNOSIS — J449 Chronic obstructive pulmonary disease, unspecified: Secondary | ICD-10-CM | POA: Diagnosis not present

## 2022-04-09 DIAGNOSIS — I4891 Unspecified atrial fibrillation: Secondary | ICD-10-CM | POA: Diagnosis not present

## 2022-04-09 DIAGNOSIS — I5032 Chronic diastolic (congestive) heart failure: Secondary | ICD-10-CM | POA: Diagnosis not present

## 2022-04-12 ENCOUNTER — Inpatient Hospital Stay: Payer: Medicare PPO | Attending: Hematology

## 2022-04-12 ENCOUNTER — Other Ambulatory Visit: Payer: Self-pay

## 2022-04-12 ENCOUNTER — Inpatient Hospital Stay: Payer: Medicare PPO

## 2022-04-12 ENCOUNTER — Inpatient Hospital Stay: Payer: Medicare PPO | Admitting: Nutrition

## 2022-04-12 ENCOUNTER — Other Ambulatory Visit: Payer: Self-pay | Admitting: Hematology

## 2022-04-12 ENCOUNTER — Inpatient Hospital Stay (HOSPITAL_BASED_OUTPATIENT_CLINIC_OR_DEPARTMENT_OTHER): Payer: Medicare PPO | Admitting: Physician Assistant

## 2022-04-12 VITALS — BP 107/84 | HR 76 | Temp 98.1°F | Resp 18 | Wt 266.5 lb

## 2022-04-12 DIAGNOSIS — Z95828 Presence of other vascular implants and grafts: Secondary | ICD-10-CM

## 2022-04-12 DIAGNOSIS — Z5189 Encounter for other specified aftercare: Secondary | ICD-10-CM | POA: Insufficient documentation

## 2022-04-12 DIAGNOSIS — Z87440 Personal history of urinary (tract) infections: Secondary | ICD-10-CM | POA: Insufficient documentation

## 2022-04-12 DIAGNOSIS — Z5111 Encounter for antineoplastic chemotherapy: Secondary | ICD-10-CM | POA: Insufficient documentation

## 2022-04-12 DIAGNOSIS — R627 Adult failure to thrive: Secondary | ICD-10-CM | POA: Diagnosis not present

## 2022-04-12 DIAGNOSIS — C8330 Diffuse large B-cell lymphoma, unspecified site: Secondary | ICD-10-CM

## 2022-04-12 DIAGNOSIS — Z5112 Encounter for antineoplastic immunotherapy: Secondary | ICD-10-CM | POA: Diagnosis not present

## 2022-04-12 DIAGNOSIS — N1831 Chronic kidney disease, stage 3a: Secondary | ICD-10-CM | POA: Insufficient documentation

## 2022-04-12 DIAGNOSIS — C8339 Diffuse large B-cell lymphoma, extranodal and solid organ sites: Secondary | ICD-10-CM | POA: Insufficient documentation

## 2022-04-12 DIAGNOSIS — Z7189 Other specified counseling: Secondary | ICD-10-CM

## 2022-04-12 DIAGNOSIS — R4182 Altered mental status, unspecified: Secondary | ICD-10-CM | POA: Insufficient documentation

## 2022-04-12 LAB — CMP (CANCER CENTER ONLY)
ALT: 55 U/L — ABNORMAL HIGH (ref 0–44)
AST: 34 U/L (ref 15–41)
Albumin: 3.1 g/dL — ABNORMAL LOW (ref 3.5–5.0)
Alkaline Phosphatase: 82 U/L (ref 38–126)
Anion gap: 5 (ref 5–15)
BUN: 22 mg/dL (ref 8–23)
CO2: 30 mmol/L (ref 22–32)
Calcium: 8.9 mg/dL (ref 8.9–10.3)
Chloride: 100 mmol/L (ref 98–111)
Creatinine: 1.21 mg/dL (ref 0.61–1.24)
GFR, Estimated: >60 mL/min (ref >60)
Glucose, Bld: 123 mg/dL — ABNORMAL HIGH (ref 70–99)
Potassium: 4.5 mmol/L (ref 3.5–5.1)
Sodium: 135 mmol/L (ref 135–145)
Total Bilirubin: 0.5 mg/dL (ref 0.3–1.2)
Total Protein: 5.8 g/dL — ABNORMAL LOW (ref 6.5–8.1)

## 2022-04-12 LAB — CBC WITH DIFFERENTIAL (CANCER CENTER ONLY)
Abs Immature Granulocytes: 6.05 10*3/uL — ABNORMAL HIGH (ref 0.00–0.07)
Basophils Absolute: 0.1 10*3/uL (ref 0.0–0.1)
Basophils Relative: 0 %
Eosinophils Absolute: 0.1 10*3/uL (ref 0.0–0.5)
Eosinophils Relative: 0 %
HCT: 35.7 % — ABNORMAL LOW (ref 39.0–52.0)
Hemoglobin: 12 g/dL — ABNORMAL LOW (ref 13.0–17.0)
Immature Granulocytes: 28 %
Lymphocytes Relative: 11 %
Lymphs Abs: 2.3 10*3/uL (ref 0.7–4.0)
MCH: 31.5 pg (ref 26.0–34.0)
MCHC: 33.6 g/dL (ref 30.0–36.0)
MCV: 93.7 fL (ref 80.0–100.0)
Monocytes Absolute: 1.5 10*3/uL — ABNORMAL HIGH (ref 0.1–1.0)
Monocytes Relative: 7 %
Neutro Abs: 11.9 10*3/uL — ABNORMAL HIGH (ref 1.7–7.7)
Neutrophils Relative %: 54 %
Platelet Count: 137 10*3/uL — ABNORMAL LOW (ref 150–400)
RBC: 3.81 MIL/uL — ABNORMAL LOW (ref 4.22–5.81)
RDW: 16.6 % — ABNORMAL HIGH (ref 11.5–15.5)
Smear Review: NORMAL
WBC Count: 21.9 10*3/uL — ABNORMAL HIGH (ref 4.0–10.5)
nRBC: 0.1 % (ref 0.0–0.2)

## 2022-04-12 LAB — LACTATE DEHYDROGENASE: LDH: 382 U/L — ABNORMAL HIGH (ref 98–192)

## 2022-04-12 MED ORDER — SODIUM CHLORIDE 0.9% FLUSH
10.0000 mL | Freq: Once | INTRAVENOUS | Status: AC
  Administered 2022-04-12: 10 mL

## 2022-04-12 MED ORDER — ACETAMINOPHEN 325 MG PO TABS
650.0000 mg | ORAL_TABLET | Freq: Once | ORAL | Status: AC
  Administered 2022-04-12: 650 mg via ORAL
  Filled 2022-04-12: qty 2

## 2022-04-12 MED ORDER — SODIUM CHLORIDE 0.9 % IV SOLN: Freq: Once | INTRAVENOUS | Status: AC

## 2022-04-12 MED ORDER — PALONOSETRON HCL INJECTION 0.25 MG/5ML
0.2500 mg | Freq: Once | INTRAVENOUS | Status: AC
  Administered 2022-04-12: 0.25 mg via INTRAVENOUS
  Filled 2022-04-12: qty 5

## 2022-04-12 MED ORDER — DIPHENHYDRAMINE HCL 25 MG PO CAPS
50.0000 mg | ORAL_CAPSULE | Freq: Once | ORAL | Status: AC
  Administered 2022-04-12: 50 mg via ORAL
  Filled 2022-04-12: qty 2

## 2022-04-12 MED ORDER — SODIUM CHLORIDE 0.9 % IV SOLN
375.0000 mg/m2 | Freq: Once | INTRAVENOUS | Status: AC
  Administered 2022-04-12: 900 mg via INTRAVENOUS
  Filled 2022-04-12: qty 50

## 2022-04-12 MED ORDER — SODIUM CHLORIDE 0.9 % IV SOLN
435.6000 mg | Freq: Once | INTRAVENOUS | Status: AC
  Administered 2022-04-12: 440 mg via INTRAVENOUS
  Filled 2022-04-12: qty 44

## 2022-04-12 MED ORDER — SODIUM CHLORIDE 0.9 % IV SOLN
20.0000 mg | Freq: Once | INTRAVENOUS | Status: AC
  Administered 2022-04-12: 20 mg via INTRAVENOUS
  Filled 2022-04-12: qty 20

## 2022-04-12 MED ORDER — SODIUM CHLORIDE 0.9% FLUSH
10.0000 mL | INTRAVENOUS | Status: DC | PRN
  Administered 2022-04-12: 10 mL

## 2022-04-12 MED ORDER — SODIUM CHLORIDE 0.9 % IV SOLN
800.0000 mg/m2 | Freq: Once | INTRAVENOUS | Status: AC
  Administered 2022-04-12: 1976 mg via INTRAVENOUS
  Filled 2022-04-12: qty 51.97

## 2022-04-12 MED ORDER — HEPARIN SOD (PORK) LOCK FLUSH 100 UNIT/ML IV SOLN
500.0000 [IU] | Freq: Once | INTRAVENOUS | Status: AC | PRN
  Administered 2022-04-12: 500 [IU]

## 2022-04-12 MED ORDER — SODIUM CHLORIDE 0.9 % IV SOLN: 375.0000 mg/m2 | Freq: Once | INTRAVENOUS | Status: DC

## 2022-04-12 MED ORDER — SODIUM CHLORIDE 0.9 % IV SOLN
150.0000 mg | Freq: Once | INTRAVENOUS | Status: AC
  Administered 2022-04-12: 150 mg via INTRAVENOUS
  Filled 2022-04-12: qty 150

## 2022-04-12 NOTE — Progress Notes (Signed)
HEMATOLOGY/ONCOLOGY CLINIC NOTE  Date of Service: 04/12/2022  Patient Care Team: Cassandria Anger, MD as PCP - General (Internal Medicine) Skeet Latch, MD as PCP - Cardiology (Cardiology) Brunetta Genera, MD as Consulting Physician (Hematology) McKenzie, Candee Furbish, MD as Consulting Physician (Urology) Jackquline Denmark, MD as Consulting Physician (Gastroenterology) Skeet Latch, MD as Attending Physician (Cardiology) Lynnell Dike, OD as Consulting Physician (Optometry) Szabat, Darnelle Maffucci, [facility] (Inactive) as Pharmacist (Pharmacist) Harriett Sine, MD as Consulting Physician (Dermatology)  CHIEF COMPLAINTS/PURPOSE OF CONSULTATION:  Follow-up to discuss management of relapsed large B-cell lymphoma  HISTORY OF PRESENTING ILLNESS:  Please see previous note for details on initial presentation.   INTERVAL HISTORY  [name] is 76 y.o. male is here with his husband for follow-up of his labs large B-cell lymphoma to discuss further evaluation and management.  He is here for Cycle 2, Day 1 of R-GCD chemotherapy. He is unaccompanied for this visit.   [name] reports that he is feeling significantly better since hospital discharge last month for AKI. He was discharged from SNF yesterday and getting settled back home. He is trying to be more ambulatory using a walker for assistance. His reports his appetite is improving. He denies any nausea, vomiting or abdominal pain. His bowel habits are unchanged with chemotherapy. He denies any recurrent episodes of diarrhea or constipation.He reports some dyspnea on exertion when he is walking around. He feels this is due to being deconditioned. He denies fevers, chills, sweats, chest pain, cough, headaches, dizziness or syncopal episodes. He has no other complaints. Rest of the ROS was reviewed and negative.   MEDICAL HISTORY:  Past Medical History:  Diagnosis Date   Anticoagulant long-term use    xarelto--- takes for AFib,   managed by pcp, Dr Alain Marion   Benign localized prostatic hyperplasia with lower urinary tract symptoms (LUTS)    CAD (coronary artery disease) cardiologist--  dr t. Oval Linsey   hx of known CAD obstruction w/ collaterals (cath done @ [facility] 12-31-2011) ;   last cardiac cath 08-13-2019  showed sig. 2V CAD involving proxLAD and CTO of the RCA/  chronic total occlusion midRCA w/ bridging and L>R collaterals   Diastolic CHF ([place])    dx 27-03-2375 hospital admission (followed by pcp)   Diffuse large B cell lymphoma [facility]) oncologist-- dr Dorisann Schwanke [place]--- in remission   dx 08/ 2019 -- bx left tonsill mass-- involving lymph nodes--- completed chemotherapy 10-12-2018   DJD (degenerative joint disease)    Dyspnea    Dysthymic disorder    Elevated brain natriuretic peptide (BNP) level    Environmental allergies    GAD (generalized anxiety disorder)    GERD (gastroesophageal reflux disease)    History of falling    multiple times   History of Graves' disease    1987 s/p total thyroidectomy   History of kidney stones    History of scabies    07/ 2014  resolved   History of syncope    per D/C note in epic 02-21-2013 ?vasovagal   History of vertebral compression fracture    05/ 2014 --  L1, no surgical intervention   HLD (hyperlipidemia)    Hyperlipidemia    Hypertension    Hypogonadism in male    IDA (iron deficiency anemia)    Lumbago    OA (osteoarthritis)    OSA on CPAP    cpap machine-settings 17   Osteomyelitis of right wrist (HCC) 11/02/2012   Osteoporosis    Severe  Osteoporosis with fracture 04/24/2013   Pathological fracture due to osteoporosis with delayed healing 04/24/2013   Persistent atrial fibrillation [facility])    cardiologist--- dr Alfonso Patten. [place]   Plaque psoriasis    dermatologist--- dr Harriett Sine--- currently taking otezla   Post-surgical hypothyroidism    followed by pcp---  s/p total thyroidectomy for graves disease in 1987   Pure hypercholesterolemia 02/10/2021   Renal  calculus, right    Right femoral fracture ([place]) 09/29/2012   Right forearm fracture 09/29/2012   Right tibial fracture 02/05/2013   Thrombocytopenia (HCC)    Unsteady gait    uses walker   Wears hearing aid in both ears     SURGICAL HISTORY: Past Surgical History:  Procedure Laterality Date   APPENDECTOMY  2011 approx.    CARDIAC CATHETERIZATION  12-31-2011   dr j. Beatrix Fetters '@[facility]'$    normal LVF w/ multivessel CAD,  occluded RCA w/ colleterals   CATARACT EXTRACTION W/ INTRAOCULAR LENS  IMPLANT, BILATERAL  2016 approx.   COLONOSCOPY     CYSTOSCOPY WITH RETROGRADE PYELOGRAM, URETEROSCOPY AND STENT PLACEMENT Left 11/07/2017   Procedure: CYSTOSCOPY WITH RETROGRADE PYELOGRAM, URETEROSCOPY AND STENT PLACEMENT;  Surgeon: Cleon Gustin, MD;  Location: [facility];  Service: Urology;  Laterality: Left;   CYSTOSCOPY WITH RETROGRADE PYELOGRAM, URETEROSCOPY AND STENT PLACEMENT Right 10/22/2019   Procedure: CYSTOSCOPY WITH RETROGRADE PYELOGRAM, URETEROSCOPY AND STENT PLACEMENT;  Surgeon: Cleon Gustin, MD;  Location: [facility];  Service: Urology;  Laterality: Right;   CYSTOSCOPY/URETEROSCOPY/HOLMIUM LASER/STENT PLACEMENT Right 03/11/2017   Procedure: CYSTOSCOPY/URETEROSCOPYSTENT PLACEMENT right ureter retrograde pylegram;  Surgeon: Cleon Gustin, MD;  [facility];  Service: Urology;  Laterality: Right;   HARDWARE REMOVAL  10/05/2012   Procedure: HARDWARE REMOVAL;  Surgeon: [name], MD;  [facility];  Service: Orthopedics;  Laterality: Right;  REMOVING  STRYKER  GAMMA NAIL   HARDWARE REMOVAL Right 07/03/2013   Procedure: HARDWARE REMOVAL RIGHT TIBIA ;  Surgeon: Rozanna Box, MD;  Location: [place];  Service: Orthopedics;  Laterality: Right;   HIP CLOSED REDUCTION Right 10/15/2013   Procedure: CLOSED MANIPULATION HIP;  Surgeon: [name], MD;  [facility];  Service: Orthopedics;  Laterality: Right;   HOLMIUM LASER APPLICATION Right 06/17/262   Procedure:  HOLMIUM LASER APPLICATION;  Surgeon: Cleon Gustin, MD;  [facility];  Service: Urology;  Laterality: Right;   HOLMIUM LASER APPLICATION Left 7/85/8850   Procedure: HOLMIUM LASER APPLICATION;  Surgeon: Cleon Gustin, MD;  [facility];  Service: Urology;  Laterality: Left;   HOLMIUM LASER APPLICATION Left 11/17/7410   Procedure: HOLMIUM LASER APPLICATION;  Surgeon: Cleon Gustin, MD;  [facility];  Service: Urology;  Laterality: Left;   HOLMIUM LASER APPLICATION Right 8/78/6767   Procedure: HOLMIUM LASER APPLICATION;  Surgeon: Cleon Gustin, MD;  Location: [facility];  Service: Urology;  Laterality: Right;   INCISION AND DRAINAGE HIP Right 11/16/2013   Procedure: IRRIGATION AND DEBRIDEMENT RIGHT HIP;  Surgeon: [name], MD;  [facility];  Service: Orthopedics;  Laterality: Right;   INTRAVASCULAR PRESSURE WIRE/FFR STUDY N/A 08/13/2019   Procedure: INTRAVASCULAR PRESSURE WIRE/FFR STUDY;  Surgeon: Nelva Bush, MD;  Location: [place] [facility];  Service: Cardiovascular;  Laterality: N/A;   IR IMAGING GUIDED PORT INSERTION  06/22/2018   IR NEPHROSTOMY PLACEMENT LEFT  03/28/2019   IR URETERAL STENT LEFT NEW ACCESS W/O SEP NEPHROSTOMY CATH  10/24/2017   IR URETERAL STENT  LEFT NEW ACCESS W/O SEP NEPHROSTOMY CATH  03/26/2019   NEPHROLITHOTOMY Right 02/08/2017   Procedure: NEPHROLITHOTOMY PERCUTANEOUS WITH SURGEON ACCESS;  Surgeon: Cleon Gustin, MD;  [facility];  Service: Urology;  Laterality: Right;   NEPHROLITHOTOMY Left 10/24/2017   Procedure: NEPHROLITHOTOMY PERCUTANEOUS;  Surgeon: Cleon Gustin, MD;  [facility];  Service: Urology;  Laterality: Left;   NEPHROLITHOTOMY Left 03/26/2019   Procedure: NEPHROLITHOTOMY PERCUTANEOUS;  Surgeon: Cleon Gustin, MD;  [facility];  Service: Urology;  Laterality: Left;  2 HRS   NEPHROLITHOTOMY Left 04/17/2019   Procedure: NEPHROLITHOTOMY PERCUTANEOUS;  Surgeon: Cleon Gustin, MD;  [facility];  Service: Urology;  Laterality: Left;  2 HRS   ORIF TIBIA FRACTURE Right 02/06/2013   Procedure: OPEN REDUCTION INTERNAL FIXATION (ORIF) TIBIA FRACTURE WITH IM ROD FIBULA;  Surgeon: Rozanna Box, MD;  Location: [place];  Service: Orthopedics;  Laterality: Right;   ORIF TIBIA FRACTURE Right 07/03/2013   Procedure: RIGHT TIBIA NON UNION REPAIR ;  Surgeon: Rozanna Box, MD;  Location: [place];  Service: Orthopedics;  Laterality: Right;   ORIF WRIST FRACTURE  10/02/2012   Procedure: OPEN REDUCTION INTERNAL FIXATION (ORIF) WRIST FRACTURE;  Surgeon: Roseanne Kaufman, MD;  [facility];  Service: Orthopedics;  Laterality: Right;  WITH   ANTIBIOTIC  CEMENT   ORIF WRIST FRACTURE Left 10/28/2013   Procedure: OPEN REDUCTION INTERNAL FIXATION (ORIF) WRIST FRACTURE with allograft;  Surgeon: Roseanne Kaufman, MD;  [facility];  Service: Orthopedics;  Laterality: Left;  DVR Plate   QUADRICEPS TENDON REPAIR Left 07/15/2017   Procedure: REPAIR QUADRICEP TENDON;  Surgeon: Frederik Pear, MD;  Location: [place];  Service: Orthopedics;  Laterality: Left;   RIGHT/LEFT HEART CATH AND CORONARY ANGIOGRAPHY N/A 08/13/2019   Procedure: RIGHT/LEFT HEART CATH AND CORONARY ANGIOGRAPHY;  Surgeon: Nelva Bush, MD;  Location: [place] [facility];  Service: Cardiovascular;  Laterality: N/A;   THYROIDECTOMY  02/1986   TOTAL HIP ARTHROPLASTY Right 03-16-2016   '@[facility]'$    TOTAL HIP REVISION  10/05/2012   Procedure: TOTAL HIP REVISION;  Surgeon: [name], MD;  [facility];  Service: Orthopedics;  Laterality: Right;  RIGHT TOTAL HIP REVISION   TOTAL HIP REVISION Right 09/17/2013   Procedure: REVISION RIGHT TOTAL HIP ARTHROPLASTY ;  Surgeon: [name], MD;  [facility];  Service: Orthopedics;  Laterality: Right;   TOTAL HIP REVISION Right 10/26/2013   Procedure: REVISION RIGHT TOTAL HIP ARTHROPLASTY;  Surgeon: [name], MD;  [facility];  Service: Orthopedics;   Laterality: Right;   TOTAL KNEE ARTHROPLASTY Bilateral right 03-15-2011;  left 06-30-2011   TOTAL KNEE REVISION Left 04/11/2017   Procedure: TOTAL KNEE REVISION PATELLA and TIBIA;  Surgeon: Frederik Pear, MD;  Location: [place];  Service: Orthopedics;  Laterality: Left;    SOCIAL HISTORY: Social History   Socioeconomic History   Marital status: Married    Spouse name: Not on file   Number of children: Not on file   Years of education: Not on file   Highest education level: Not on file  Occupational History   Not on file  Tobacco Use   Smoking status: Former    Packs/day: 1.00    Years: 50.00    Total pack years: 50.00    Types: Cigarettes    Quit date: 01/12/2012    Years since quitting: 10.2   Smokeless tobacco: Never  Vaping Use   Vaping Use: Never used  Substance and Sexual Activity   Alcohol use: Not Currently    Comment: occasional-social   Drug use: Never   Sexual activity: Yes  Other Topics Concern   Not on file  Social History Narrative   [place] 2.5 months, went home Feb 21st slipped and fell on back and developed.  Home PT/OT.  Just started outpatient physical therapy.  Friday night, misstepped.       Pt spouse verbalized pt had fallen 4 times within the last 24 hours     Social Determinants of Health   Financial Resource Strain: Low Risk  (01/07/2022)   Overall Financial Resource Strain (CARDIA)    Difficulty of Paying Living Expenses: Not hard at all  Food Insecurity: No Food Insecurity (01/07/2022)   Hunger Vital Sign    Worried About Running Out of Food in the Last Year: Never true    Ran Out of Food in the Last Year: Never true  Transportation Needs: No Transportation Needs (01/07/2022)   PRAPARE - Hydrologist (Medical): No    Lack of Transportation (Non-Medical): No  Physical Activity: Sufficiently Active (01/07/2022)   Exercise Vital Sign    Days of Exercise per Week: 3 days    Minutes of Exercise per Session: 60 min  Stress:  No Stress Concern Present (01/07/2022)   [place]    Feeling of Stress : Not at all  Social Connections: [place] (01/07/2022)   Social Connection and Isolation Panel [NHANES]    Frequency of Communication with Friends and Family: More than three times a week    Frequency of Social Gatherings with Friends and Family: More than three times a week    Attends Religious Services: 1 to 4 times per year    Active Member of [employer] or Organizations: No    Attends Music therapist: 1 to 4 times per year    Marital Status: Married  Human resources officer Violence: Not At Risk (01/07/2022)   Humiliation, Afraid, Rape, and Kick questionnaire    Fear of Current or Ex-Partner: No    Emotionally Abused: No    Physically Abused: No    Sexually Abused: No    FAMILY HISTORY: Family History  Problem Relation Age of Onset   CAD Father 11   Asthma Father    Alcohol abuse Father    Arthritis Mother    Alcohol abuse Sister     ALLERGIES:  is allergic to short ragweed pollen ext.  MEDICATIONS:  Current Outpatient Medications  Medication Sig Dispense Refill   acetaminophen (TYLENOL) 325 MG tablet Take 2 tablets (650 mg total) by mouth every 6 (six) hours as needed for mild pain (or Fever >/= 101). 20 tablet    albuterol (VENTOLIN HFA) 108 (90 Base) MCG/ACT inhaler Inhale 2 puffs into the lungs every 6 (six) hours as needed for wheezing or shortness of breath.     alfuzosin (UROXATRAL) 10 MG 24 hr tablet TAKE 1 TABLET AT BEDTIME (Patient taking differently: 10 mg at bedtime.) 90 tablet 3   allopurinol (ZYLOPRIM) 100 MG tablet Take 1 tablet (100 mg total) by mouth 2 (two) times daily. 60 tablet 0   amoxicillin (AMOXIL) 500 MG capsule Take 2,000 mg by mouth daily as needed (dental appointments).     aspirin EC 81 MG tablet Take 81 mg by mouth daily.     atorvastatin (LIPITOR) 80 MG tablet Take 80 mg by mouth daily.  buPROPion (WELLBUTRIN XL) 300 MG 24 hr tablet TAKE 1 TABLET EVERY DAY WITH BREAKFAST (Patient taking differently: Take 300 mg by mouth daily.) 90 tablet 3   Calcium Citrate-Vitamin D (CALCIUM CITRATE + D PO) Take 1 tablet by mouth 2 (two) times daily. '1200mg'$  of Calcium and 1000units vitamin D3     Cholecalciferol (VITAMIN D-3 PO) Take 2,000 Units by mouth 2 (two) times daily.      clonazePAM (KLONOPIN) 1 MG tablet Take 1 tablet (1 mg total) by mouth every 6 (six) hours as needed for anxiety. Take 1 tablet (1 mg total) by mouth every 6 (six) hours as needed. for anxiety Strength: 1 mg 10 tablet 0   CRANBERRY PO Take 2 tablets by mouth daily.     denosumab (PROLIA) 60 MG/ML SOSY injection Inject 60 mg as directed every 6 (six) months. Last injection: Feb 2020 Next injection: August 2020     dexamethasone (DECADRON) 4 MG tablet Take 5 tablets (20 mg total) by mouth daily. Start the day after carboplatin chemotherapy for 3 days. 30 tablet 3   diltiazem (CARDIZEM CD) 240 MG 24 hr capsule TAKE 1 CAPSULE EVERY DAY (Patient taking differently: Take 240 mg by mouth daily.) 90 capsule 3   Docusate Sodium 100 MG capsule Take 100 mg by mouth 2 (two) times daily.     DULoxetine (CYMBALTA) 60 MG capsule TAKE 1 CAPSULE TWICE DAILY (Patient taking differently: Take 60 mg by mouth 2 (two) times daily.) 180 capsule 3   EPINEPHrine 0.3 mg/0.3 mL IJ SOAJ injection Inject 0.3 mg into the muscle as needed for anaphylaxis.     ezetimibe (ZETIA) 10 MG tablet TAKE 1 TABLET EVERY DAY 90 tablet 2   feeding supplement (ENSURE ENLIVE / ENSURE PLUS) LIQD Take 237 mLs by mouth 2 (two) times daily between meals. 237 mL 12   ferrous sulfate 325 (65 FE) MG tablet Take 325 mg by mouth 2 (two) times daily with a meal.      hydrochlorothiazide (HYDRODIURIL) 12.5 MG tablet TAKE 1 TABLET (12.5 MG TOTAL) BY MOUTH DAILY. 90 tablet 3   HYDROcodone-acetaminophen (NORCO) 7.5-325 MG tablet Take 1 tablet by mouth every 6 (six) hours as needed  for severe pain. 10 tablet 0   isosorbide mononitrate (IMDUR) 30 MG 24 hr tablet TAKE 1 TABLET (30 MG TOTAL) BY MOUTH DAILY. 90 tablet 2   levothyroxine (SYNTHROID) 100 MCG tablet Take 100 mcg by mouth daily before breakfast.     loratadine (CLARITIN) 10 MG tablet Take 10 mg by mouth daily.      losartan (COZAAR) 50 MG tablet TAKE 1 TABLET EVERY DAY (Patient taking differently: Take 50 mg by mouth daily.) 90 tablet 2   Magnesium 500 MG CAPS Take 1 capsule (500 mg total) by mouth daily. (Patient taking differently: Take 500 mg by mouth every evening.) 30 capsule 1   metoprolol tartrate (LOPRESSOR) 25 MG tablet Take 1 tablet (25 mg total) by mouth 2 (two) times daily. 180 tablet 3   Multiple Vitamin (MULTIVITAMIN WITH MINERALS) TABS tablet Take 1 tablet by mouth daily. Men's One-A-Day 50+     MYRBETRIQ 25 MG TB24 tablet TAKE 1 TABLET EVERY DAY (Patient taking differently: Take 25 mg by mouth daily.) 90 tablet 3   nitroGLYCERIN (NITROSTAT) 0.4 MG SL tablet Place 0.4 mg under the tongue every 5 (five) minutes as needed for chest pain. x3 doses as needed for chest pain     omeprazole (PRILOSEC) 40 MG capsule TAKE 1  CAPSULE EVERY DAY BEFORE BREAKFAST (Patient taking differently: Take 40 mg by mouth daily. TAKE 1 CAPSULE EVERY DAY BEFORE BREAKFAST) 90 capsule 3   ondansetron (ZOFRAN) 8 MG tablet Take 1 tablet (8 mg total) by mouth 2 (two) times daily as needed. Start on the 3rd day after cisplatin chemotherapy. 30 tablet 1   Potassium Citrate 15 MEQ (1620 MG) TBCR TAKE 1 TABLET TWICE DAILY (Patient taking differently: Take 1 tablet by mouth in the morning and at bedtime.) 180 tablet 3   Probiotic Product (PROBIOTIC PO) Take 1 capsule by mouth daily with breakfast.     prochlorperazine (COMPAZINE) 10 MG tablet Take 1 tablet (10 mg total) by mouth every 6 (six) hours as needed (Nausea or vomiting). 30 tablet 1   rivaroxaban (XARELTO) 10 MG TABS tablet Take 1 tablet (10 mg total) by mouth daily. 90 tablet 3    solifenacin (VESICARE) 10 MG tablet Take 10 mg by mouth daily.     testosterone enanthate (DELATESTRYL) 200 MG/ML injection INJECT 1ML ('200MG'$ ) INTO THE MUSCLE EVERY 14 DAYS. DISCARD VIAL AFTER 28 DAYS. (Patient taking differently: 200 mg every 14 (fourteen) days. INJECT 1ML ('200MG'$ ) INTO THE MUSCLE EVERY 14 DAYS. DISCARD VIAL AFTER 28 DAYS.) 5 mL 3   vitamin C (ASCORBIC ACID) 500 MG tablet Take 500 mg by mouth daily.     No current facility-administered medications for this visit.   Facility-Administered Medications Ordered in Other Visits  Medication Dose Route Frequency Provider Last Rate Last Admin   CARBOplatin (PARAPLATIN) 440 mg in sodium chloride 0.9 % 250 mL chemo infusion  440 mg Intravenous Once Brunetta Genera, MD       fosaprepitant (EMEND) 150 mg in sodium chloride 0.9 % 145 mL IVPB  150 mg Intravenous Once Brunetta Genera, MD 450 mL/hr at 04/12/22 1034 150 mg at 04/12/22 1034   gemcitabine (GEMZAR) 1,976 mg in sodium chloride 0.9 % 250 mL chemo infusion  800 mg/m2 (Treatment Plan Recorded) Intravenous Once Brunetta Genera, MD       heparin lock flush 100 unit/mL  500 Units Intracatheter Once PRN Brunetta Genera, MD       riTUXimab-pvvr (RUXIENCE) 900 mg in sodium chloride 0.9 % 160 mL infusion  375 mg/m2 (Treatment Plan Recorded) Intravenous Once Kniyah Khun T, PA-C       sodium chloride flush (NS) 0.9 % injection 10 mL  10 mL Intracatheter PRN Brunetta Genera, MD        REVIEW OF SYSTEMS:   Constitutional: Negative for appetite change, fatigue, chills, fever HENT: Negative for mouth sores, nosebleeds, sore throat and trouble swallowing.   Eyes: Negative for eye problems and icterus.  Respiratory: Negative for cough, hemoptysis, and wheezing.  +SOB with exertion Cardiovascular: Negative for chest pain and leg swelling.  Gastrointestinal: Negative for abdominal pain, constipation, diarrhea, nausea and vomiting.  Genitourinary: Negative for bladder  incontinence, difficulty urinating, dysuria, frequency and hematuria.   Skin:Negative for rash and ulcers Neurological: Negative for dizziness, extremity weakness, gait problem, headaches, light-headedness and seizures.  Hematological: Negative for adenopathy. Does not bruise/bleed easily.  Psychiatric/Behavioral: Negative for confusion, depression and sleep disturbance. The patient is not nervous/anxious.      PHYSICAL EXAMINATION:  ECOG FS:3 - Symptomatic, >50% confined to bed  There were no vitals filed for this visit.  Wt Readings from Last 3 Encounters:  04/12/22 266 lb 8 oz (120.9 kg)  03/26/22 255 lb 3.2 oz (115.8 kg)  03/19/22 251 lb 11.2  oz (114.2 kg)    There is no height or weight on file to calculate BMI.   NAD GENERAL:alert, in no acute distress and comfortable SKIN: no acute rashes, no significant lesions EYES: conjunctiva are pink and non-injected, sclera anicteric LUNGS: clear to auscultation b/l with normal respiratory effort HEART: regular rate & rhythm Extremity: no pedal edema PSYCH: alert & oriented x 3 with fluent speech NEURO: no focal motor/sensory deficits  Exam performed in chair.  LABORATORY DATA:  I have reviewed the data as listed     Latest Ref Rng & Units 04/12/2022    8:57 AM 03/25/2022    2:20 AM 03/24/2022    8:53 AM  CBC  WBC 4.0 - 10.5 K/uL 21.9  8.1  8.9   Hemoglobin 13.0 - 17.0 g/dL 12.0  14.7  15.0   Hematocrit 39.0 - 52.0 % 35.7  43.4  44.1   Platelets 150 - 400 K/uL 137  148  142        Latest Ref Rng & Units 04/12/2022    8:57 AM 03/25/2022    2:20 AM 03/24/2022    8:53 AM  CMP  Glucose 70 - 99 mg/dL 123  146  147   BUN 8 - 23 mg/dL 22  47  37   Creatinine 0.61 - 1.24 mg/dL 1.21  1.10  1.15   Sodium 135 - 145 mmol/L 135  138  138   Potassium 3.5 - 5.1 mmol/L 4.5  4.7  4.4   Chloride 98 - 111 mmol/L 100  105  105   CO2 22 - 32 mmol/L '30  26  25   '$ Calcium 8.9 - 10.3 mg/dL 8.9  9.1  9.0   Total Protein 6.5 - 8.1 g/dL 5.8  6.0   6.1   Total Bilirubin 0.3 - 1.2 mg/dL 0.5  1.0  1.1   Alkaline Phos 38 - 126 U/L 82  51  57   AST 15 - 41 U/L 34  59  36   ALT 0 - 44 U/L 55  57  35    . Lab Results  Component Value Date   LDH 382 (H) 04/12/2022   06/28/2019 ECHOCARDIOGRAM COMPLETE (Accession [id number])   05/29/18 Left tonsil biopsy:   RADIOGRAPHIC STUDIES: I have personally reviewed the radiological images as listed and agreed with the findings in the report.   .CT HEAD WO CONTRAST (5MM)  Result Date: 03/22/2022 CLINICAL DATA:  Dizziness, persistent/recurrent, cardiac or vascular cause suspected EXAM: CT HEAD WITHOUT CONTRAST TECHNIQUE: Contiguous axial images were obtained from the base of the skull through the vertex without intravenous contrast. RADIATION DOSE REDUCTION: This exam was performed according to the departmental dose-optimization program which includes automated exposure control, adjustment of the mA and/or kV according to patient size and/or use of iterative reconstruction technique. COMPARISON:  MRI head Feb 27, 2022. FINDINGS: Brain: No evidence of acute infarction, hemorrhage, hydrocephalus, extra-axial collection or mass lesion/mass effect. Patchy white matter hypodensities, nonspecific but compatible with chronic microvascular disease. Vascular: No hyperdense vessel identified. Skull: No acute fracture. Sinuses/Orbits: Clear sinuses.  No acute orbital findings. Other: Incompletely imaged known nasopharyngeal mass. Small right mastoid effusion. IMPRESSION: 1. No evidence of acute intracranial abnormality. 2. Incompletely imaged known nasopharyngeal mass. Electronically Signed   By: Margaretha Sheffield M.D.   On: 03/22/2022 11:20   [place] CORE BIOPSY (SOFT TISSUE)  Result Date: 03/17/2022 INDICATION: History of DLBCL with hypermetabolic LEFT gluteal/flank mass EXAM: ULTRASOUND-GUIDED LEFT GLUTEAL SOFT TISSUE MASS  BIOPSY COMPARISON:  CT AP, 03/06/2022.  PET-CT, 03/02/2022 MEDICATIONS: None  ANESTHESIA/SEDATION: Local anesthetic and single agent sedation was employed during this procedure. A total of fentanyl 50 mcg was administered intravenously. The patient's level of consciousness and vital signs were monitored continuously by radiology nursing throughout the procedure under my direct supervision. COMPLICATIONS: None immediate. TECHNIQUE: Informed written consent was obtained from the patient and/or patient's representative after a discussion of the risks, benefits and alternatives to treatment. Questions regarding the procedure were encouraged and answered. Initial ultrasound scanning demonstrated hypervascular subcutaneous LEFT gluteal soft tissue mass. An ultrasound image was saved for documentation purposes. The procedure was planned. A timeout was performed prior to the initiation of the procedure. The operative was prepped and draped in the usual sterile fashion, and a sterile drape was applied covering the operative field. A timeout was performed prior to the initiation of the procedure. Local anesthesia was provided with 1% lidocaine with epinephrine. Under direct ultrasound guidance, an 18 gauge core needle device was utilized to obtain to obtain 4 core needle biopsies of the LEFT gluteal soft tissue mass. The samples were placed in saline and submitted to pathology. The needle was removed and hemostasis was achieved with manual compression. Post procedure scan was negative for significant hematoma. A dressing was placed. The patient tolerated the procedure well without immediate postprocedural complication. IMPRESSION: Successful ultrasound guided biopsy of LEFT gluteal soft tissue mass, as above. Michaelle Birks, MD [facility] Electronically Signed   By: Michaelle Birks M.D.   On: 03/17/2022 09:32    ASSESSMENT & PLAN:  76 y.o. male with  1. Diffuse Large B-Cell Lymphoma,- Stage III Left tonsil -05/29/18 Left tonsil biopsy which  revealed Diffuse Large B-Cell Lymphoma, which requires treatment  -06/07/18 LDH at 214, will monitor change through treatment.  -Baseline ECHO 06/14/18: EF 50-55% -Standard regimen of R-CHOP with G-CSF support for 6 cycles starting 06/23/18 -06/16/18 PET/CT revealed 1. Deauville 5 hypermetabolic lesions in the palatine tonsils, left internal jugular chain, and involving a right inguinal lymph node. Deauville 4 involvement of a left inguinal lymph node and an enlarged subcutaneous lesion favoring lymph node superficial to the lower right sternocleidomastoid muscle in the neck. 2. Other imaging findings of potential clinical significance: Aortic Atherosclerosis. Coronary atherosclerosis. Emphysema. Bilateral nonobstructive nephrolithiasis. Umbilical hernia contains adipose tissues. Old right rib fractures some of which are nonunited. 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  -08/22/18 PET/CT revealed Interval response to therapy. Overall Deauville criteria 3. Interval decrease in size and FDG uptake associated with palatine tonsil lesions. There has been resolution of previous left level-II and bilateral inguinal lymph nodes. 2. Enlarged subcutaneous lesion superficial to the lower right sternocleidomastoid muscle exhibits decreased FDG uptake. Currently Deauville criteria 2. 3. No new or progressive sites of disease identified. 4.  Aortic Atherosclerosis. 5. Multi vessel coronary artery atherosclerotic calcifications. 6. Unchanged vertebra plana deformity. 7. Kidney stones.  -S/p 6 cycles of R-CHOP completed on 10/12/18 -11/22/18 PET/CT revealed The tonsillar mass has essentially resolved. The large presumed lymph node superficial to the right sternocleidomastoid is stable in size at 2.3 cm in short axis, with maximum SUV of 3.3 (formerly 2.4), representing Deauville 3 activity, previously Deauville 2. Other imaging findings of potential clinical significance: Aortic Atherosclerosis and Emphysema. Coronary atherosclerosis. Bilateral  nonobstructive nephrolithiasis. Umbilical hernia contains adipose tissue. Chronic high activity posterior to the right hip implant along a region of bony irregularity and deformity probably reflecting chronic inflammation. L1 vertebra plana.  #  2 Recurrent large B-cell lymphoma with left buttock soft tissue mass, pulmonary mass, widespread lymphadenopathy -PET scan from 03/02/2022 showed recurrent disease with new hypermetabolic soft tissue of the right posterior nasopharynx, right level 2B lymph node, right lower lung mass, left pelvic and left inguinal nodes, soft tissue nodules and masses of the left buttocks, lymph nodes located anterior to the right sternocleidomastoid muscle. -Started chemotherapy with R-GDP on 03/22/2022.   #3 H/O AKI on CKD stage IIIa  --Patient follows with Dr. Junious Silk of urology --Recently admitted in June 2023 with AKI.   #4 Altered mental status and failure to thrive in the context of poor p.o. intake and recent recurrent UTI --Symptoms have resolved after recent hospital discharge.  --CT head and MRI was negative for acute intracranial abnormalities   FOLLOW UP: --Labs from today were reviewed and adequate for treatment. WBC 21.9 (due to Udenyca), Hgb 12.0, Plt 137K. Creatinine level is normal at 1.21.  --Proceed with treatment today and return next week for Cycle 2, Day 8.  --RTC on 05/03/2022 with port labs, f/u visit with Dr. Montavius Subramaniam Limbo, treatment with R-GCD chemotherapy  All of the patient's questions were answered with apparent satisfaction. The patient knows to call the clinic with any problems, questions or concerns.   I have spent a total of 30 minutes minutes of face-to-face and non-face-to-face time, preparing to see the patient,  performing a medically appropriate examination, counseling and educating the patient, ordering medications/tests, documenting clinical information in the electronic health record, and care coordination.   Dede Query PA-C Dept of  Hematology and [place] at [facility] Phone: [phone]

## 2022-04-12 NOTE — Patient Instructions (Signed)
[facility]COL[facility]   Discharge Instructions: Thank you for choosing [facility] to provide your oncology and hematology care.   If you have a lab appointment with the [facility], please go directly to the [facility] and check in at the registration area.   Wear comfortable clothing and clothing appropriate for easy access to any Portacath or PICC line.   We strive to give you quality time with your provider. You may need to reschedule your appointment if you arrive late (15 or more minutes).  Arriving late affects you and other patients whose appointments are after yours.  Also, if you miss three or more appointments without notifying the office, you may be dismissed from the clinic at the provider's discretion.      For prescription refill requests, have your pharmacy contact our office and allow 72 hours for refills to be completed.    Today you received the following chemotherapy and/or immunotherapy agents: Rituxan, Carboplatin, & Gemzar      To help prevent nausea and vomiting after your treatment, we encourage you to take your nausea medication as directed.  BELOW ARE SYMPTOMS THAT SHOULD BE REPORTED IMMEDIATELY: *FEVER GREATER THAN 100.4 F (38 C) OR HIGHER *CHILLS OR SWEATING *NAUSEA AND VOMITING THAT IS NOT CONTROLLED WITH YOUR NAUSEA MEDICATION *UNUSUAL SHORTNESS OF BREATH *UNUSUAL BRUISING OR BLEEDING *URINARY PROBLEMS (pain or burning when urinating, or frequent urination) *BOWEL PROBLEMS (unusual diarrhea, constipation, pain near the anus) TENDERNESS IN MOUTH AND THROAT WITH OR WITHOUT PRESENCE OF ULCERS (sore throat, sores in mouth, or a toothache) UNUSUAL RASH, SWELLING OR PAIN  UNUSUAL VAGINAL DISCHARGE OR ITCHING   Items with * indicate a potential emergency and should be followed up as soon as possible or go to the Emergency Department if any problems should occur.  Please show the CHEMOTHERAPY ALERT CARD or IMMUNOTHERAPY  ALERT CARD at check-in to the Emergency Department and triage nurse.  Should you have questions after your visit or need to cancel or reschedule your appointment, please contact [facility]  Dept: [phone]  and follow the prompts.  Office hours are 8:00 a.m. to 4:30 p.m. Monday - Friday. Please note that voicemails left after 4:00 p.m. may not be returned until the following business day.  We are closed weekends and major holidays. You have access to a nurse at all times for urgent questions. Please call the main number to the clinic Dept: [phone] and follow the prompts.   For any non-urgent questions, you may also contact your provider using MyChart. We now offer e-Visits for anyone 109 and older to request care online for non-urgent symptoms. For details visit [email or web address].   Also download the MyChart app! Go to the app store, search "MyChart", open the app, select [facility], and log in with your MyChart username and password.  Masks are optional in the cancer centers. If you would like for your care team to wear a mask while they are taking care of you, please let them know. For doctor visits, patients may have with them one support Amand Lemoine who is at least 77 years old. At this time, visitors are not allowed in the infusion area.

## 2022-04-12 NOTE — Progress Notes (Signed)
76 year old male diagnosed with relapsed large B-cell lymphoma and followed by Dr. Irene Limbo.  Past medical history includes CAD, CHF, left tonsil mass status post chemotherapy, GERD, Graves' disease, hard of hearing with follow-up bilateral hearing aids.  Medications include Wellbutrin, ferrous sulfate, Synthroid, magnesium, multivitamin, Zofran, Prilosec, Compazine, and vitamin C.  Labs include glucose 123, albumin 3.1 on July 3.  Height: 6 foot 3-1/2 inches. Weight documented as 266 pounds 8 ounces on July 3.   This is improved from 255 pounds 3 ounces on June 16.   Patient weighed 285 pounds in March 2023. BMI: 32.87.  Patient reports he weighed 310 pounds last summer.  At first he was trying to lose weight and then he reports the weight just started falling off.  Reports he would like to lose some additional weight but he wants to do what is best for him.  He has questions about having to weigh daily and report back to MD if gaining 1 pound daily. (Likely related to hx of CHF)  He denies nutrition impact symptoms.  Nutrition diagnosis:  Food and nutrition related knowledge deficit related to relapsed B-cell lymphoma as evidenced by no prior need for nutrition related information.  Intervention: Provided active listening as patient shared much of his personal experience over the last few months. Brief education provided on low-sodium, high-protein foods.  Nutrition fact sheet provided. Refer back to MD to clarify reason for daily weights. Consider 1 carton of Ensure max or equivalent daily. Encouraged weight maintenance/slow weight loss of 1 pound or less weekly during treatment. Questions answered.  Teach back method used.  Contact information provided.  Monitoring, evaluation, goals: Patient will tolerate adequate calorie and protein intake to minimize loss of lean body mass.  Next visit: To be scheduled with treatment as needed.  **Disclaimer: This note was dictated with voice  recognition software. Similar sounding words can inadvertently be transcribed and this note may contain transcription errors which may not have been corrected upon publication of note.**

## 2022-04-19 ENCOUNTER — Inpatient Hospital Stay: Payer: Medicare PPO

## 2022-04-19 ENCOUNTER — Other Ambulatory Visit: Payer: Self-pay

## 2022-04-19 VITALS — BP 106/61 | HR 93 | Temp 97.7°F | Resp 18

## 2022-04-19 DIAGNOSIS — Z7901 Long term (current) use of anticoagulants: Secondary | ICD-10-CM

## 2022-04-19 DIAGNOSIS — R4182 Altered mental status, unspecified: Secondary | ICD-10-CM | POA: Diagnosis not present

## 2022-04-19 DIAGNOSIS — I951 Orthostatic hypotension: Secondary | ICD-10-CM | POA: Diagnosis not present

## 2022-04-19 DIAGNOSIS — M1990 Unspecified osteoarthritis, unspecified site: Secondary | ICD-10-CM | POA: Diagnosis present

## 2022-04-19 DIAGNOSIS — Z79890 Hormone replacement therapy (postmenopausal): Secondary | ICD-10-CM

## 2022-04-19 DIAGNOSIS — I4891 Unspecified atrial fibrillation: Secondary | ICD-10-CM | POA: Diagnosis not present

## 2022-04-19 DIAGNOSIS — T3695XA Adverse effect of unspecified systemic antibiotic, initial encounter: Secondary | ICD-10-CM | POA: Diagnosis not present

## 2022-04-19 DIAGNOSIS — R6520 Severe sepsis without septic shock: Secondary | ICD-10-CM | POA: Diagnosis present

## 2022-04-19 DIAGNOSIS — E890 Postprocedural hypothyroidism: Secondary | ICD-10-CM | POA: Diagnosis present

## 2022-04-19 DIAGNOSIS — S22079A Unspecified fracture of T9-T10 vertebra, initial encounter for closed fracture: Secondary | ICD-10-CM | POA: Diagnosis not present

## 2022-04-19 DIAGNOSIS — Y92230 Patient room in hospital as the place of occurrence of the external cause: Secondary | ICD-10-CM | POA: Diagnosis not present

## 2022-04-19 DIAGNOSIS — N390 Urinary tract infection, site not specified: Secondary | ICD-10-CM | POA: Diagnosis not present

## 2022-04-19 DIAGNOSIS — K219 Gastro-esophageal reflux disease without esophagitis: Secondary | ICD-10-CM | POA: Diagnosis present

## 2022-04-19 DIAGNOSIS — D630 Anemia in neoplastic disease: Secondary | ICD-10-CM | POA: Diagnosis present

## 2022-04-19 DIAGNOSIS — Z7982 Long term (current) use of aspirin: Secondary | ICD-10-CM

## 2022-04-19 DIAGNOSIS — G4733 Obstructive sleep apnea (adult) (pediatric): Secondary | ICD-10-CM | POA: Diagnosis present

## 2022-04-19 DIAGNOSIS — R296 Repeated falls: Secondary | ICD-10-CM | POA: Diagnosis present

## 2022-04-19 DIAGNOSIS — Z743 Need for continuous supervision: Secondary | ICD-10-CM | POA: Diagnosis not present

## 2022-04-19 DIAGNOSIS — Z87440 Personal history of urinary (tract) infections: Secondary | ICD-10-CM | POA: Diagnosis not present

## 2022-04-19 DIAGNOSIS — Z5112 Encounter for antineoplastic immunotherapy: Secondary | ICD-10-CM | POA: Diagnosis not present

## 2022-04-19 DIAGNOSIS — I110 Hypertensive heart disease with heart failure: Secondary | ICD-10-CM | POA: Diagnosis present

## 2022-04-19 DIAGNOSIS — D6959 Other secondary thrombocytopenia: Secondary | ICD-10-CM | POA: Diagnosis not present

## 2022-04-19 DIAGNOSIS — I959 Hypotension, unspecified: Secondary | ICD-10-CM | POA: Diagnosis not present

## 2022-04-19 DIAGNOSIS — F339 Major depressive disorder, recurrent, unspecified: Secondary | ICD-10-CM | POA: Diagnosis not present

## 2022-04-19 DIAGNOSIS — I4819 Other persistent atrial fibrillation: Secondary | ICD-10-CM | POA: Diagnosis present

## 2022-04-19 DIAGNOSIS — I2510 Atherosclerotic heart disease of native coronary artery without angina pectoris: Secondary | ICD-10-CM | POA: Diagnosis present

## 2022-04-19 DIAGNOSIS — I5032 Chronic diastolic (congestive) heart failure: Secondary | ICD-10-CM | POA: Diagnosis present

## 2022-04-19 DIAGNOSIS — Z96641 Presence of right artificial hip joint: Secondary | ICD-10-CM | POA: Diagnosis present

## 2022-04-19 DIAGNOSIS — C8330 Diffuse large B-cell lymphoma, unspecified site: Secondary | ICD-10-CM | POA: Diagnosis present

## 2022-04-19 DIAGNOSIS — S22059A Unspecified fracture of T5-T6 vertebra, initial encounter for closed fracture: Secondary | ICD-10-CM | POA: Diagnosis not present

## 2022-04-19 DIAGNOSIS — E7800 Pure hypercholesterolemia, unspecified: Secondary | ICD-10-CM | POA: Diagnosis present

## 2022-04-19 DIAGNOSIS — A419 Sepsis, unspecified organism: Secondary | ICD-10-CM | POA: Diagnosis not present

## 2022-04-19 DIAGNOSIS — F331 Major depressive disorder, recurrent, moderate: Secondary | ICD-10-CM | POA: Diagnosis not present

## 2022-04-19 DIAGNOSIS — Z5111 Encounter for antineoplastic chemotherapy: Secondary | ICD-10-CM | POA: Diagnosis not present

## 2022-04-19 DIAGNOSIS — Z452 Encounter for adjustment and management of vascular access device: Secondary | ICD-10-CM | POA: Diagnosis not present

## 2022-04-19 DIAGNOSIS — Z87891 Personal history of nicotine dependence: Secondary | ICD-10-CM

## 2022-04-19 DIAGNOSIS — Z8261 Family history of arthritis: Secondary | ICD-10-CM

## 2022-04-19 DIAGNOSIS — R531 Weakness: Secondary | ICD-10-CM | POA: Diagnosis not present

## 2022-04-19 DIAGNOSIS — J449 Chronic obstructive pulmonary disease, unspecified: Secondary | ICD-10-CM | POA: Diagnosis present

## 2022-04-19 DIAGNOSIS — Z1612 Extended spectrum beta lactamase (ESBL) resistance: Secondary | ICD-10-CM | POA: Diagnosis present

## 2022-04-19 DIAGNOSIS — F32A Depression, unspecified: Secondary | ICD-10-CM | POA: Diagnosis present

## 2022-04-19 DIAGNOSIS — N400 Enlarged prostate without lower urinary tract symptoms: Secondary | ICD-10-CM | POA: Diagnosis present

## 2022-04-19 DIAGNOSIS — N1831 Chronic kidney disease, stage 3a: Secondary | ICD-10-CM | POA: Diagnosis not present

## 2022-04-19 DIAGNOSIS — Z66 Do not resuscitate: Secondary | ICD-10-CM | POA: Diagnosis present

## 2022-04-19 DIAGNOSIS — L400 Psoriasis vulgaris: Secondary | ICD-10-CM | POA: Diagnosis present

## 2022-04-19 DIAGNOSIS — Z96653 Presence of artificial knee joint, bilateral: Secondary | ICD-10-CM | POA: Diagnosis present

## 2022-04-19 DIAGNOSIS — R627 Adult failure to thrive: Secondary | ICD-10-CM | POA: Diagnosis not present

## 2022-04-19 DIAGNOSIS — N3000 Acute cystitis without hematuria: Secondary | ICD-10-CM | POA: Diagnosis present

## 2022-04-19 DIAGNOSIS — R6889 Other general symptoms and signs: Secondary | ICD-10-CM | POA: Diagnosis not present

## 2022-04-19 DIAGNOSIS — Z79899 Other long term (current) drug therapy: Secondary | ICD-10-CM

## 2022-04-19 DIAGNOSIS — F410 Panic disorder [episodic paroxysmal anxiety] without agoraphobia: Secondary | ICD-10-CM | POA: Diagnosis not present

## 2022-04-19 DIAGNOSIS — R42 Dizziness and giddiness: Secondary | ICD-10-CM | POA: Diagnosis not present

## 2022-04-19 DIAGNOSIS — Z825 Family history of asthma and other chronic lower respiratory diseases: Secondary | ICD-10-CM

## 2022-04-19 DIAGNOSIS — Z91048 Other nonmedicinal substance allergy status: Secondary | ICD-10-CM

## 2022-04-19 DIAGNOSIS — I499 Cardiac arrhythmia, unspecified: Secondary | ICD-10-CM | POA: Diagnosis not present

## 2022-04-19 DIAGNOSIS — M810 Age-related osteoporosis without current pathological fracture: Secondary | ICD-10-CM | POA: Diagnosis present

## 2022-04-19 DIAGNOSIS — Z87442 Personal history of urinary calculi: Secondary | ICD-10-CM

## 2022-04-19 DIAGNOSIS — I480 Paroxysmal atrial fibrillation: Secondary | ICD-10-CM | POA: Diagnosis not present

## 2022-04-19 DIAGNOSIS — C8339 Diffuse large B-cell lymphoma, extranodal and solid organ sites: Secondary | ICD-10-CM | POA: Diagnosis not present

## 2022-04-19 DIAGNOSIS — A4151 Sepsis due to Escherichia coli [E. coli]: Principal | ICD-10-CM | POA: Diagnosis present

## 2022-04-19 DIAGNOSIS — F411 Generalized anxiety disorder: Secondary | ICD-10-CM | POA: Diagnosis present

## 2022-04-19 DIAGNOSIS — R404 Transient alteration of awareness: Secondary | ICD-10-CM | POA: Diagnosis not present

## 2022-04-19 DIAGNOSIS — Z5189 Encounter for other specified aftercare: Secondary | ICD-10-CM | POA: Diagnosis not present

## 2022-04-19 DIAGNOSIS — Z8249 Family history of ischemic heart disease and other diseases of the circulatory system: Secondary | ICD-10-CM

## 2022-04-19 DIAGNOSIS — Z974 Presence of external hearing-aid: Secondary | ICD-10-CM

## 2022-04-19 DIAGNOSIS — I10 Essential (primary) hypertension: Secondary | ICD-10-CM | POA: Diagnosis not present

## 2022-04-19 DIAGNOSIS — Z7189 Other specified counseling: Secondary | ICD-10-CM

## 2022-04-19 LAB — CMP (CANCER CENTER ONLY)
ALT: 78 U/L — ABNORMAL HIGH (ref 0–44)
AST: 29 U/L (ref 15–41)
Albumin: 3.2 g/dL — ABNORMAL LOW (ref 3.5–5.0)
Alkaline Phosphatase: 65 U/L (ref 38–126)
Anion gap: 5 (ref 5–15)
BUN: 35 mg/dL — ABNORMAL HIGH (ref 8–23)
CO2: 30 mmol/L (ref 22–32)
Calcium: 8.7 mg/dL — ABNORMAL LOW (ref 8.9–10.3)
Chloride: 97 mmol/L — ABNORMAL LOW (ref 98–111)
Creatinine: 1.21 mg/dL (ref 0.61–1.24)
GFR, Estimated: >60 mL/min (ref >60)
Glucose, Bld: 136 mg/dL — ABNORMAL HIGH (ref 70–99)
Potassium: 4 mmol/L (ref 3.5–5.1)
Sodium: 132 mmol/L — ABNORMAL LOW (ref 135–145)
Total Bilirubin: 1.5 mg/dL — ABNORMAL HIGH (ref 0.3–1.2)
Total Protein: 5.9 g/dL — ABNORMAL LOW (ref 6.5–8.1)

## 2022-04-19 LAB — CBC WITH DIFFERENTIAL (CANCER CENTER ONLY)
Abs Immature Granulocytes: 0.1 10*3/uL — ABNORMAL HIGH (ref 0.00–0.07)
Basophils Absolute: 0 10*3/uL (ref 0.0–0.1)
Basophils Relative: 0 %
Eosinophils Absolute: 0 10*3/uL (ref 0.0–0.5)
Eosinophils Relative: 0 %
HCT: 35.7 % — ABNORMAL LOW (ref 39.0–52.0)
Hemoglobin: 12.4 g/dL — ABNORMAL LOW (ref 13.0–17.0)
Immature Granulocytes: 1 %
Lymphocytes Relative: 9 %
Lymphs Abs: 0.7 10*3/uL (ref 0.7–4.0)
MCH: 31.4 pg (ref 26.0–34.0)
MCHC: 34.7 g/dL (ref 30.0–36.0)
MCV: 90.4 fL (ref 80.0–100.0)
Monocytes Absolute: 0.3 10*3/uL (ref 0.1–1.0)
Monocytes Relative: 4 %
Neutro Abs: 7 10*3/uL (ref 1.7–7.7)
Neutrophils Relative %: 86 %
Platelet Count: 184 10*3/uL (ref 150–400)
RBC: 3.95 MIL/uL — ABNORMAL LOW (ref 4.22–5.81)
RDW: 15.9 % — ABNORMAL HIGH (ref 11.5–15.5)
WBC Count: 8.1 10*3/uL (ref 4.0–10.5)
nRBC: 0 % (ref 0.0–0.2)

## 2022-04-19 LAB — LACTATE DEHYDROGENASE: LDH: 349 U/L — ABNORMAL HIGH (ref 98–192)

## 2022-04-19 MED ORDER — DIPHENHYDRAMINE HCL 25 MG PO CAPS
50.0000 mg | ORAL_CAPSULE | Freq: Once | ORAL | Status: AC
  Administered 2022-04-19: 50 mg via ORAL
  Filled 2022-04-19: qty 2

## 2022-04-19 MED ORDER — SODIUM CHLORIDE 0.9% FLUSH
10.0000 mL | INTRAVENOUS | Status: DC | PRN
  Administered 2022-04-19: 10 mL

## 2022-04-19 MED ORDER — ACETAMINOPHEN 325 MG PO TABS
650.0000 mg | ORAL_TABLET | Freq: Once | ORAL | Status: AC
  Administered 2022-04-19: 650 mg via ORAL
  Filled 2022-04-19: qty 2

## 2022-04-19 MED ORDER — SODIUM CHLORIDE 0.9 % IV SOLN
800.0000 mg/m2 | Freq: Once | INTRAVENOUS | Status: AC
  Administered 2022-04-19: 1976 mg via INTRAVENOUS
  Filled 2022-04-19: qty 51.97

## 2022-04-19 MED ORDER — SODIUM CHLORIDE 0.9 % IV SOLN: Freq: Once | INTRAVENOUS | Status: AC

## 2022-04-19 MED ORDER — HEPARIN SOD (PORK) LOCK FLUSH 100 UNIT/ML IV SOLN
500.0000 [IU] | Freq: Once | INTRAVENOUS | Status: AC | PRN
  Administered 2022-04-19: 500 [IU]

## 2022-04-19 MED ORDER — PROCHLORPERAZINE MALEATE 10 MG PO TABS
10.0000 mg | ORAL_TABLET | Freq: Once | ORAL | Status: AC
  Administered 2022-04-19: 10 mg via ORAL
  Filled 2022-04-19: qty 1

## 2022-04-19 NOTE — Patient Instructions (Signed)
[facility]  Discharge Instructions: Thank you for choosing [facility] to provide your oncology and hematology care.   If you have a lab appointment with the Cancer Center, please go directly to the Cancer Center and check in at the registration area.   Wear comfortable clothing and clothing appropriate for easy access to any Portacath or PICC line.   We strive to give you quality time with your provider. You may need to reschedule your appointment if you arrive late (15 or more minutes).  Arriving late affects you and other patients whose appointments are after yours.  Also, if you miss three or more appointments without notifying the office, you may be dismissed from the clinic at the provider's discretion.      For prescription refill requests, have your pharmacy contact our office and allow 72 hours for refills to be completed.    Today you received the following chemotherapy and/or immunotherapy agents: Gemzar      To help prevent nausea and vomiting after your treatment, we encourage you to take your nausea medication as directed.  BELOW ARE SYMPTOMS THAT SHOULD BE REPORTED IMMEDIATELY: *FEVER GREATER THAN 100.4 F (38 C) OR HIGHER *CHILLS OR SWEATING *NAUSEA AND VOMITING THAT IS NOT CONTROLLED WITH YOUR NAUSEA MEDICATION *UNUSUAL SHORTNESS OF BREATH *UNUSUAL BRUISING OR BLEEDING *URINARY PROBLEMS (pain or burning when urinating, or frequent urination) *BOWEL PROBLEMS (unusual diarrhea, constipation, pain near the anus) TENDERNESS IN MOUTH AND THROAT WITH OR WITHOUT PRESENCE OF ULCERS (sore throat, sores in mouth, or a toothache) UNUSUAL RASH, SWELLING OR PAIN  UNUSUAL VAGINAL DISCHARGE OR ITCHING   Items with * indicate a potential emergency and should be followed up as soon as possible or go to the Emergency Department if any problems should occur.  Please show the CHEMOTHERAPY ALERT CARD or IMMUNOTHERAPY ALERT CARD at check-in to the  Emergency Department and triage nurse.  Should you have questions after your visit or need to cancel or reschedule your appointment, please contact [facility]  Dept: [phone]  and follow the prompts.  Office hours are 8:00 a.m. to 4:30 p.m. Monday - Friday. Please note that voicemails left after 4:00 p.m. may not be returned until the following business day.  We are closed weekends and major holidays. You have access to a nurse at all times for urgent questions. Please call the main number to the clinic Dept: [phone] and follow the prompts.   For any non-urgent questions, you may also contact your provider using MyChart. We now offer e-Visits for anyone 18 and older to request care online for non-urgent symptoms. For details visit [email or web address].   Also download the MyChart app! Go to the app store, search "MyChart", open the app, select [facility], and log in with your MyChart username and password.  Masks are optional in the cancer centers. If you would like for your care team to wear a mask while they are taking care of you, please let them know. For doctor visits, patients may have with them one support person who is at least 77 years old. At this time, visitors are not allowed in the infusion area. 

## 2022-04-20 ENCOUNTER — Other Ambulatory Visit: Payer: Self-pay

## 2022-04-20 ENCOUNTER — Inpatient Hospital Stay: Payer: Medicare PPO

## 2022-04-20 ENCOUNTER — Telehealth: Payer: Self-pay | Admitting: Internal Medicine

## 2022-04-20 VITALS — BP 86/57 | HR 90 | Temp 97.6°F | Resp 20

## 2022-04-20 DIAGNOSIS — Z5112 Encounter for antineoplastic immunotherapy: Secondary | ICD-10-CM | POA: Diagnosis not present

## 2022-04-20 DIAGNOSIS — Z7189 Other specified counseling: Secondary | ICD-10-CM

## 2022-04-20 DIAGNOSIS — C8330 Diffuse large B-cell lymphoma, unspecified site: Secondary | ICD-10-CM

## 2022-04-20 DIAGNOSIS — Z95828 Presence of other vascular implants and grafts: Secondary | ICD-10-CM

## 2022-04-20 DIAGNOSIS — N1831 Chronic kidney disease, stage 3a: Secondary | ICD-10-CM | POA: Diagnosis not present

## 2022-04-20 DIAGNOSIS — R4182 Altered mental status, unspecified: Secondary | ICD-10-CM | POA: Diagnosis not present

## 2022-04-20 DIAGNOSIS — R627 Adult failure to thrive: Secondary | ICD-10-CM | POA: Diagnosis not present

## 2022-04-20 DIAGNOSIS — Z5111 Encounter for antineoplastic chemotherapy: Secondary | ICD-10-CM | POA: Diagnosis not present

## 2022-04-20 DIAGNOSIS — Z87440 Personal history of urinary (tract) infections: Secondary | ICD-10-CM | POA: Diagnosis not present

## 2022-04-20 DIAGNOSIS — C8339 Diffuse large B-cell lymphoma, extranodal and solid organ sites: Secondary | ICD-10-CM | POA: Diagnosis not present

## 2022-04-20 DIAGNOSIS — Z5189 Encounter for other specified aftercare: Secondary | ICD-10-CM | POA: Diagnosis not present

## 2022-04-20 MED ORDER — PEGFILGRASTIM-CBQV 6 MG/0.6ML ~~LOC~~ SOSY
6.0000 mg | PREFILLED_SYRINGE | Freq: Once | SUBCUTANEOUS | Status: AC
  Administered 2022-04-20: 6 mg via SUBCUTANEOUS
  Filled 2022-04-20: qty 0.6

## 2022-04-20 NOTE — Progress Notes (Signed)
Pt came in for Udenyca injection.  Pts BP 86/57.  Pts symptoms - anxious because he thought he was going to be late & only had a pack of crackers to eat. Baseline BP is normally low. Informed desk nurse.

## 2022-04-20 NOTE — Patient Instructions (Signed)
Pegfilgrastim Injection ?What is this medication? ?PEGFILGRASTIM (PEG fil gra stim) lowers the risk of infection in people who are receiving chemotherapy. It works by helping your body make more white blood cells, which protects your body from infection. It may also be used to help people who have been exposed to high doses of radiation. ?This medicine may be used for other purposes; ask your health care provider or pharmacist if you have questions. ?COMMON BRAND NAME(S): Fulphila, Fylnetra, Neulasta, Nyvepria, Stimufend, UDENYCA, Ziextenzo ?What should I tell my care team before I take this medication? ?They need to know if you have any of these conditions: ?Kidney disease ?Latex allergy ?Ongoing radiation therapy ?Sickle cell disease ?Skin reactions to acrylic adhesives (On-Body Injector only) ?An unusual or allergic reaction to pegfilgrastim, filgrastim, other medications, foods, dyes, or preservatives ?Pregnant or trying to get pregnant ?Breast-feeding ?How should I use this medication? ?This medication is for injection under the skin. If you get this medication at home, you will be taught how to prepare and give the pre-filled syringe or how to use the On-body Injector. Refer to the patient Instructions for Use for detailed instructions. Use exactly as directed. Tell your care team immediately if you suspect that the On-body Injector may not have performed as intended or if you suspect the use of the On-body Injector resulted in a missed or partial dose. ?It is important that you put your used needles and syringes in a special sharps container. Do not put them in a trash can. If you do not have a sharps container, call your pharmacist or care team to get one. ?Talk to your care team about the use of this medication in children. While this medication may be prescribed for selected conditions, precautions do apply. ?Overdosage: If you think you have taken too much of this medicine contact a poison control center  or emergency room at once. ?NOTE: This medicine is only for you. Do not share this medicine with others. ?What if I miss a dose? ?It is important not to miss your dose. Call your care team if you miss your dose. If you miss a dose due to an On-body Injector failure or leakage, a new dose should be administered as soon as possible using a single prefilled syringe for manual use. ?What may interact with this medication? ?Interactions have not been studied. ?This list may not describe all possible interactions. Give your health care provider a list of all the medicines, herbs, non-prescription drugs, or dietary supplements you use. Also tell them if you smoke, drink alcohol, or use illegal drugs. Some items may interact with your medicine. ?What should I watch for while using this medication? ?Your condition will be monitored carefully while you are receiving this medication. ?You may need blood work done while you are taking this medication. ?Talk to your care team about your risk of cancer. You may be more at risk for certain types of cancer if you take this medication. ?If you are going to need a MRI, CT scan, or other procedure, tell your care team that you are using this medication (On-Body Injector only). ?What side effects may I notice from receiving this medication? ?Side effects that you should report to your care team as soon as possible: ?Allergic reactions--skin rash, itching, hives, swelling of the face, lips, tongue, or throat ?Capillary leak syndrome--stomach or muscle pain, unusual weakness or fatigue, feeling faint or lightheaded, decrease in the amount of urine, swelling of the ankles, hands, or feet, trouble   breathing ?High white blood cell level--fever, fatigue, trouble breathing, night sweats, change in vision, weight loss ?Inflammation of the aorta--fever, fatigue, back, chest, or stomach pain, severe headache ?Kidney injury (glomerulonephritis)--decrease in the amount of urine, red or dark brown  urine, foamy or bubbly urine, swelling of the ankles, hands, or feet ?Shortness of breath or trouble breathing ?Spleen injury--pain in upper left stomach or shoulder ?Unusual bruising or bleeding ?Side effects that usually do not require medical attention (report to your care team if they continue or are bothersome): ?Bone pain ?Pain in the hands or feet ?This list may not describe all possible side effects. Call your doctor for medical advice about side effects. You may report side effects to FDA at 1-800-FDA-1088. ?Where should I keep my medication? ?Keep out of the reach of children. ?If you are using this medication at home, you will be instructed on how to store it. Throw away any unused medication after the expiration date on the label. ?NOTE: This sheet is a summary. It may not cover all possible information. If you have questions about this medicine, talk to your doctor, pharmacist, or health care provider. ?? 2023 Elsevier/Gold Standard (2021-08-28 00:00:00) ? ?

## 2022-04-20 NOTE — Progress Notes (Signed)
1L over 2 hours placed under sign and hold.

## 2022-04-20 NOTE — Telephone Encounter (Signed)
Melissa the occupational therapist from [facility] is requesting verbal orders for occupational therapy home health.  Frequency: 2 times a week for 2 weeks. 1 time a week for 3 weeks.  CBLenna Sciara  [phone]. Ok to leave a message.

## 2022-04-20 NOTE — Telephone Encounter (Signed)
Ok thx.

## 2022-04-21 ENCOUNTER — Inpatient Hospital Stay: Payer: Medicare PPO

## 2022-04-21 ENCOUNTER — Other Ambulatory Visit: Payer: Self-pay

## 2022-04-21 ENCOUNTER — Inpatient Hospital Stay: Payer: Medicare PPO | Admitting: Physician Assistant

## 2022-04-21 VITALS — BP 94/63 | HR 80 | Temp 97.7°F | Resp 18 | Wt 250.1 lb

## 2022-04-21 DIAGNOSIS — R627 Adult failure to thrive: Secondary | ICD-10-CM | POA: Diagnosis not present

## 2022-04-21 DIAGNOSIS — F331 Major depressive disorder, recurrent, moderate: Secondary | ICD-10-CM | POA: Diagnosis not present

## 2022-04-21 DIAGNOSIS — Z95828 Presence of other vascular implants and grafts: Secondary | ICD-10-CM

## 2022-04-21 DIAGNOSIS — Z5111 Encounter for antineoplastic chemotherapy: Secondary | ICD-10-CM | POA: Diagnosis not present

## 2022-04-21 DIAGNOSIS — Z5189 Encounter for other specified aftercare: Secondary | ICD-10-CM | POA: Diagnosis not present

## 2022-04-21 DIAGNOSIS — N390 Urinary tract infection, site not specified: Secondary | ICD-10-CM

## 2022-04-21 DIAGNOSIS — Z5112 Encounter for antineoplastic immunotherapy: Secondary | ICD-10-CM | POA: Diagnosis not present

## 2022-04-21 DIAGNOSIS — F410 Panic disorder [episodic paroxysmal anxiety] without agoraphobia: Secondary | ICD-10-CM | POA: Diagnosis not present

## 2022-04-21 DIAGNOSIS — C8330 Diffuse large B-cell lymphoma, unspecified site: Secondary | ICD-10-CM

## 2022-04-21 DIAGNOSIS — N1831 Chronic kidney disease, stage 3a: Secondary | ICD-10-CM | POA: Diagnosis not present

## 2022-04-21 DIAGNOSIS — C8339 Diffuse large B-cell lymphoma, extranodal and solid organ sites: Secondary | ICD-10-CM | POA: Diagnosis not present

## 2022-04-21 DIAGNOSIS — R4182 Altered mental status, unspecified: Secondary | ICD-10-CM | POA: Diagnosis not present

## 2022-04-21 DIAGNOSIS — Z87440 Personal history of urinary (tract) infections: Secondary | ICD-10-CM | POA: Diagnosis not present

## 2022-04-21 DIAGNOSIS — Z7189 Other specified counseling: Secondary | ICD-10-CM

## 2022-04-21 MED ORDER — HEPARIN SOD (PORK) LOCK FLUSH 100 UNIT/ML IV SOLN
500.0000 [IU] | Freq: Once | INTRAVENOUS | Status: AC
  Administered 2022-04-21: 500 [IU]

## 2022-04-21 MED ORDER — SODIUM CHLORIDE 0.9% FLUSH
10.0000 mL | Freq: Once | INTRAVENOUS | Status: AC
  Administered 2022-04-21: 10 mL

## 2022-04-21 MED ORDER — LIDOCAINE-PRILOCAINE 2.5-2.5 % EX CREA: 1.0000 | TOPICAL_CREAM | CUTANEOUS | 0 refills | Status: DC | PRN

## 2022-04-21 MED ORDER — SODIUM CHLORIDE 0.9 % IV SOLN: Freq: Once | INTRAVENOUS | Status: AC

## 2022-04-21 NOTE — Progress Notes (Signed)
Patient declined to stay for full 2 hour IVF infusion as time would conflict with prior appointment. Patient deaccessed and vital signs retaken. Pt d/c in stable condition.

## 2022-04-21 NOTE — Patient Instructions (Signed)
Rehydration, Adult Rehydration is the replacement of body fluids, salts, and minerals (electrolytes) that are lost during dehydration. Dehydration is when there is not enough water or other fluids in the body. This happens when you lose more fluids than you take in. Common causes of dehydration include: Not drinking enough fluids. This can occur when you are ill or doing activities that require a lot of energy, especially in hot weather. Conditions that cause loss of water or other fluids, such as diarrhea, vomiting, sweating, or urinating a lot. Other illnesses, such as fever or infection. Certain medicines, such as those that remove excess fluid from the body (diuretics). Symptoms of mild or moderate dehydration may include thirst, dry lips and mouth, and dizziness. Symptoms of severe dehydration may include increased heart rate, confusion, fainting, and not urinating. For severe dehydration, you may need to get fluids through an IV at the hospital. For mild or moderate dehydration, you can usually rehydrate at home by drinking certain fluids as told by your health care provider. What are the risks? Generally, rehydration is safe. However, taking in too much fluid (overhydration) can be a problem. This is rare. Overhydration can cause an electrolyte imbalance, kidney failure, or a decrease in salt (sodium) levels in the body. Supplies needed You will need an oral rehydration solution (ORS) if your health care provider tells you to use one. This is a drink to treat dehydration. It can be found in pharmacies and retail stores. How to rehydrate Fluids Follow instructions from your health care provider for rehydration. The kind of fluid and the amount you should drink depend on your condition. In general, you should choose drinks that you prefer. If told by your health care provider, drink an ORS. Make an ORS by following instructions on the package. Start by drinking small amounts, about  cup (120  mL) every 5-10 minutes. Slowly increase how much you drink until you have taken the amount recommended by your health care provider. Drink enough clear fluids to keep your urine pale yellow. If you were told to drink an ORS, finish it first, then start slowly drinking other clear fluids. Drink fluids such as: Water. This includes sparkling water and flavored water. Drinking only water can lead to having too little sodium in your body (hyponatremia). Follow the advice of your health care provider. Water from ice chips you suck on. Fruit juice with water you add to it (diluted). Sports drinks. Hot or cold herbal teas. Broth-based soups. Milk or milk products. Food Follow instructions from your health care provider about what to eat while you rehydrate. Your health care provider may recommend that you slowly begin eating regular foods in small amounts. Eat foods that contain a healthy balance of electrolytes, such as bananas, oranges, potatoes, tomatoes, and spinach. Avoid foods that are greasy or contain a lot of sugar. In some cases, you may get nutrition through a feeding tube that is passed through your nose and into your stomach (nasogastric tube, or NG tube). This may be done if you have uncontrolled vomiting or diarrhea. Beverages to avoid  Certain beverages may make dehydration worse. While you rehydrate, avoid drinking alcohol. How to tell if you are recovering from dehydration You may be recovering from dehydration if: You are urinating more often than before you started rehydrating. Your urine is pale yellow. Your energy level improves. You vomit less frequently. You have diarrhea less frequently. Your appetite improves or returns to normal. You feel less dizzy or less light-headed.   Your skin tone and color start to look more normal. Follow these instructions at home: Take over-the-counter and prescription medicines only as told by your health care provider. Do not take sodium  tablets. Doing this can lead to having too much sodium in your body (hypernatremia). Contact a health care provider if: You continue to have symptoms of mild or moderate dehydration, such as: Thirst. Dry lips. Slightly dry mouth. Dizziness. Dark urine or less urine than normal. Muscle cramps. You continue to vomit or have diarrhea. Get help right away if you: Have symptoms of dehydration that get worse. Have a fever. Have a severe headache. Have been vomiting and the following happens: Your vomiting gets worse or does not go away. Your vomit includes blood or green matter (bile). You cannot eat or drink without vomiting. Have problems with urination or bowel movements, such as: Diarrhea that gets worse or does not go away. Blood in your stool (feces). This may cause stool to look black and tarry. Not urinating, or urinating only a small amount of very dark urine, within 6-8 hours. Have trouble breathing. Have symptoms that get worse with treatment. These symptoms may represent a serious problem that is an emergency. Do not wait to see if the symptoms will go away. Get medical help right away. Call your local emergency services (911 in the [place] Do not drive yourself to the hospital. Summary Rehydration is the replacement of body fluids and minerals (electrolytes) that are lost during dehydration. Follow instructions from your health care provider for rehydration. The kind of fluid and amount you should drink depend on your condition. Slowly increase how much you drink until you have taken the amount recommended by your health care provider. Contact your health care provider if you continue to show signs of mild or moderate dehydration. This information is not intended to replace advice given to you by your health care provider. Make sure you discuss any questions you have with your health care provider. Document Revised: 11/28/2019 Document Reviewed: 10/08/2019 [employer] Patient  Education  2023 [employer]  

## 2022-04-22 ENCOUNTER — Emergency Department (HOSPITAL_COMMUNITY): Payer: Medicare PPO

## 2022-04-22 ENCOUNTER — Other Ambulatory Visit: Payer: Self-pay

## 2022-04-22 ENCOUNTER — Encounter (HOSPITAL_COMMUNITY): Payer: Self-pay | Admitting: Emergency Medicine

## 2022-04-22 ENCOUNTER — Inpatient Hospital Stay (HOSPITAL_COMMUNITY)
Admission: EM | Admit: 2022-04-22 | Discharge: 2022-04-27 | DRG: 872 | Disposition: A | Discharge status: Home Health | Payer: Medicare PPO | Attending: Internal Medicine | Admitting: Internal Medicine

## 2022-04-22 DIAGNOSIS — F32A Depression, unspecified: Secondary | ICD-10-CM | POA: Diagnosis present

## 2022-04-22 DIAGNOSIS — Z87891 Personal history of nicotine dependence: Secondary | ICD-10-CM

## 2022-04-22 DIAGNOSIS — N400 Enlarged prostate without lower urinary tract symptoms: Secondary | ICD-10-CM | POA: Diagnosis present

## 2022-04-22 DIAGNOSIS — I480 Paroxysmal atrial fibrillation: Secondary | ICD-10-CM | POA: Diagnosis not present

## 2022-04-22 DIAGNOSIS — N3000 Acute cystitis without hematuria: Principal | ICD-10-CM

## 2022-04-22 DIAGNOSIS — N390 Urinary tract infection, site not specified: Secondary | ICD-10-CM | POA: Diagnosis present

## 2022-04-22 DIAGNOSIS — I110 Hypertensive heart disease with heart failure: Secondary | ICD-10-CM | POA: Diagnosis present

## 2022-04-22 DIAGNOSIS — Z79890 Hormone replacement therapy (postmenopausal): Secondary | ICD-10-CM

## 2022-04-22 DIAGNOSIS — Z7901 Long term (current) use of anticoagulants: Secondary | ICD-10-CM

## 2022-04-22 DIAGNOSIS — Z8249 Family history of ischemic heart disease and other diseases of the circulatory system: Secondary | ICD-10-CM

## 2022-04-22 DIAGNOSIS — Z87440 Personal history of urinary (tract) infections: Secondary | ICD-10-CM

## 2022-04-22 DIAGNOSIS — E890 Postprocedural hypothyroidism: Secondary | ICD-10-CM | POA: Diagnosis present

## 2022-04-22 DIAGNOSIS — D630 Anemia in neoplastic disease: Secondary | ICD-10-CM | POA: Diagnosis present

## 2022-04-22 DIAGNOSIS — D6959 Other secondary thrombocytopenia: Secondary | ICD-10-CM | POA: Diagnosis not present

## 2022-04-22 DIAGNOSIS — I951 Orthostatic hypotension: Secondary | ICD-10-CM

## 2022-04-22 DIAGNOSIS — M810 Age-related osteoporosis without current pathological fracture: Secondary | ICD-10-CM | POA: Diagnosis present

## 2022-04-22 DIAGNOSIS — Z1612 Extended spectrum beta lactamase (ESBL) resistance: Secondary | ICD-10-CM | POA: Diagnosis present

## 2022-04-22 DIAGNOSIS — R6520 Severe sepsis without septic shock: Secondary | ICD-10-CM | POA: Diagnosis present

## 2022-04-22 DIAGNOSIS — Z7982 Long term (current) use of aspirin: Secondary | ICD-10-CM

## 2022-04-22 DIAGNOSIS — A4151 Sepsis due to Escherichia coli [E. coli]: Principal | ICD-10-CM | POA: Diagnosis present

## 2022-04-22 DIAGNOSIS — Y92230 Patient room in hospital as the place of occurrence of the external cause: Secondary | ICD-10-CM | POA: Diagnosis not present

## 2022-04-22 DIAGNOSIS — Z66 Do not resuscitate: Secondary | ICD-10-CM | POA: Diagnosis present

## 2022-04-22 DIAGNOSIS — Z974 Presence of external hearing-aid: Secondary | ICD-10-CM

## 2022-04-22 DIAGNOSIS — F339 Major depressive disorder, recurrent, unspecified: Secondary | ICD-10-CM | POA: Diagnosis not present

## 2022-04-22 DIAGNOSIS — K219 Gastro-esophageal reflux disease without esophagitis: Secondary | ICD-10-CM | POA: Diagnosis present

## 2022-04-22 DIAGNOSIS — Z96641 Presence of right artificial hip joint: Secondary | ICD-10-CM | POA: Diagnosis present

## 2022-04-22 DIAGNOSIS — L400 Psoriasis vulgaris: Secondary | ICD-10-CM | POA: Diagnosis present

## 2022-04-22 DIAGNOSIS — A419 Sepsis, unspecified organism: Secondary | ICD-10-CM | POA: Diagnosis not present

## 2022-04-22 DIAGNOSIS — E7800 Pure hypercholesterolemia, unspecified: Secondary | ICD-10-CM | POA: Diagnosis present

## 2022-04-22 DIAGNOSIS — Z79899 Other long term (current) drug therapy: Secondary | ICD-10-CM

## 2022-04-22 DIAGNOSIS — M1990 Unspecified osteoarthritis, unspecified site: Secondary | ICD-10-CM | POA: Diagnosis present

## 2022-04-22 DIAGNOSIS — I2510 Atherosclerotic heart disease of native coronary artery without angina pectoris: Secondary | ICD-10-CM | POA: Diagnosis present

## 2022-04-22 DIAGNOSIS — F411 Generalized anxiety disorder: Secondary | ICD-10-CM | POA: Diagnosis present

## 2022-04-22 DIAGNOSIS — C8330 Diffuse large B-cell lymphoma, unspecified site: Secondary | ICD-10-CM | POA: Diagnosis present

## 2022-04-22 DIAGNOSIS — E872 Acidosis, unspecified: Secondary | ICD-10-CM | POA: Diagnosis present

## 2022-04-22 DIAGNOSIS — I4819 Other persistent atrial fibrillation: Secondary | ICD-10-CM | POA: Diagnosis present

## 2022-04-22 DIAGNOSIS — R296 Repeated falls: Secondary | ICD-10-CM | POA: Diagnosis present

## 2022-04-22 DIAGNOSIS — Z91048 Other nonmedicinal substance allergy status: Secondary | ICD-10-CM

## 2022-04-22 DIAGNOSIS — Z825 Family history of asthma and other chronic lower respiratory diseases: Secondary | ICD-10-CM

## 2022-04-22 DIAGNOSIS — I10 Essential (primary) hypertension: Secondary | ICD-10-CM | POA: Diagnosis not present

## 2022-04-22 DIAGNOSIS — E039 Hypothyroidism, unspecified: Secondary | ICD-10-CM | POA: Diagnosis present

## 2022-04-22 DIAGNOSIS — I5032 Chronic diastolic (congestive) heart failure: Secondary | ICD-10-CM | POA: Diagnosis present

## 2022-04-22 DIAGNOSIS — Z87442 Personal history of urinary calculi: Secondary | ICD-10-CM

## 2022-04-22 DIAGNOSIS — T3695XA Adverse effect of unspecified systemic antibiotic, initial encounter: Secondary | ICD-10-CM | POA: Diagnosis not present

## 2022-04-22 DIAGNOSIS — G4733 Obstructive sleep apnea (adult) (pediatric): Secondary | ICD-10-CM | POA: Diagnosis present

## 2022-04-22 DIAGNOSIS — I959 Hypotension, unspecified: Secondary | ICD-10-CM | POA: Diagnosis present

## 2022-04-22 DIAGNOSIS — J449 Chronic obstructive pulmonary disease, unspecified: Secondary | ICD-10-CM | POA: Diagnosis present

## 2022-04-22 DIAGNOSIS — Z96653 Presence of artificial knee joint, bilateral: Secondary | ICD-10-CM | POA: Diagnosis present

## 2022-04-22 DIAGNOSIS — Z8261 Family history of arthritis: Secondary | ICD-10-CM

## 2022-04-22 LAB — BASIC METABOLIC PANEL
Anion gap: 8 (ref 5–15)
BUN: 44 mg/dL — ABNORMAL HIGH (ref 8–23)
CO2: 21 mmol/L — ABNORMAL LOW (ref 22–32)
Calcium: 8.2 mg/dL — ABNORMAL LOW (ref 8.9–10.3)
Chloride: 103 mmol/L (ref 98–111)
Creatinine, Ser: 1.39 mg/dL — ABNORMAL HIGH (ref 0.61–1.24)
GFR, Estimated: 53 mL/min — ABNORMAL LOW (ref >60)
Glucose, Bld: 159 mg/dL — ABNORMAL HIGH (ref 70–99)
Potassium: 4.7 mmol/L (ref 3.5–5.1)
Sodium: 132 mmol/L — ABNORMAL LOW (ref 135–145)

## 2022-04-22 LAB — CBC WITH DIFFERENTIAL/PLATELET
Abs Immature Granulocytes: 0 10*3/uL (ref 0.00–0.07)
Band Neutrophils: 0 %
Basophils Absolute: 0 10*3/uL (ref 0.0–0.1)
Basophils Relative: 0 %
Blasts: 0 %
Eosinophils Absolute: 0 10*3/uL (ref 0.0–0.5)
Eosinophils Relative: 0 %
HCT: 29 % — ABNORMAL LOW (ref 39.0–52.0)
Hemoglobin: 10.2 g/dL — ABNORMAL LOW (ref 13.0–17.0)
Lymphocytes Relative: 2 %
Lymphs Abs: 0.5 10*3/uL — ABNORMAL LOW (ref 0.7–4.0)
MCH: 32.8 pg (ref 26.0–34.0)
MCHC: 35.2 g/dL (ref 30.0–36.0)
MCV: 93.2 fL (ref 80.0–100.0)
Metamyelocytes Relative: 0 %
Monocytes Absolute: 0.3 10*3/uL (ref 0.1–1.0)
Monocytes Relative: 1 %
Myelocytes: 0 %
Neutro Abs: 26.1 10*3/uL — ABNORMAL HIGH (ref 1.7–7.7)
Neutrophils Relative %: 97 %
Other: 0 %
Platelets: 126 10*3/uL — ABNORMAL LOW (ref 150–400)
Promyelocytes Relative: 0 %
RBC: 3.11 MIL/uL — ABNORMAL LOW (ref 4.22–5.81)
RDW: 16.5 % — ABNORMAL HIGH (ref 11.5–15.5)
WBC: 26.9 10*3/uL — ABNORMAL HIGH (ref 4.0–10.5)
nRBC: 0 % (ref 0.0–0.2)
nRBC: 0 /100 WBC

## 2022-04-22 LAB — URINALYSIS, ROUTINE W REFLEX MICROSCOPIC
Bilirubin Urine: NEGATIVE
Glucose, UA: NEGATIVE mg/dL
Ketones, ur: NEGATIVE mg/dL
Nitrite: NEGATIVE
Protein, ur: NEGATIVE mg/dL
Specific Gravity, Urine: 1.008 (ref 1.005–1.030)
WBC, UA: >50 WBC/hpf — ABNORMAL HIGH (ref 0–5)
pH: 6 (ref 5.0–8.0)

## 2022-04-22 LAB — LACTIC ACID, PLASMA: Lactic Acid, Venous: 2.6 mmol/L (ref 0.5–1.9)

## 2022-04-22 LAB — POC OCCULT BLOOD, ED: Fecal Occult Bld: NEGATIVE

## 2022-04-22 LAB — HEPATIC FUNCTION PANEL
ALT: 57 U/L — ABNORMAL HIGH (ref 0–44)
AST: 29 U/L (ref 15–41)
Albumin: 2.7 g/dL — ABNORMAL LOW (ref 3.5–5.0)
Alkaline Phosphatase: 60 U/L (ref 38–126)
Bilirubin, Direct: 0.2 mg/dL (ref 0.0–0.2)
Indirect Bilirubin: 0.8 mg/dL (ref 0.3–0.9)
Total Bilirubin: 1 mg/dL (ref 0.3–1.2)
Total Protein: 5.5 g/dL — ABNORMAL LOW (ref 6.5–8.1)

## 2022-04-22 LAB — LIPASE, BLOOD: Lipase: 24 U/L (ref 11–51)

## 2022-04-22 LAB — TROPONIN I (HIGH SENSITIVITY)
Troponin I (High Sensitivity): 6 ng/L (ref <18)
Troponin I (High Sensitivity): 6 ng/L (ref <18)

## 2022-04-22 MED ORDER — SODIUM CHLORIDE 0.9 % IV SOLN
2.0000 g | Freq: Two times a day (BID) | INTRAVENOUS | Status: DC
  Administered 2022-04-23 – 2022-04-24 (×3): 2 g via INTRAVENOUS
  Filled 2022-04-22 (×3): qty 12.5

## 2022-04-22 MED ORDER — LACTATED RINGERS IV BOLUS
1000.0000 mL | Freq: Once | INTRAVENOUS | Status: AC
  Administered 2022-04-22: 1000 mL via INTRAVENOUS

## 2022-04-22 MED ORDER — IOHEXOL 350 MG/ML SOLN
100.0000 mL | Freq: Once | INTRAVENOUS | Status: AC | PRN
Start: 2022-04-22 — End: 2022-04-22
  Administered 2022-04-22: 100 mL via INTRAVENOUS

## 2022-04-22 MED ORDER — SODIUM CHLORIDE 0.9 % IV SOLN
1.0000 g | Freq: Once | INTRAVENOUS | Status: AC
  Administered 2022-04-22: 1 g via INTRAVENOUS
  Filled 2022-04-22: qty 10

## 2022-04-22 MED ORDER — LACTATED RINGERS IV SOLN: INTRAVENOUS | Status: DC

## 2022-04-22 NOTE — Progress Notes (Signed)
Pharmacy Antibiotic Note  [name] is a 76 y.o. male admitted on 04/22/2022 with  generalized weakness, dizziness when standing intermittently the last several days.  History of lymphoma, restarted chemotherapy recently .  Recently was treated for urinary tract infection.  Pharmacy has been consulted to dose cefepime for UTI.  Plan: Cefepime 2gm IV q12h Follow renal function, cultures and clinical course  Height: 6' 3.5" (191.8 cm) Weight: 113.4 kg (250 lb) IBW/kg (Calculated) : 85.65  Temp (24hrs), Avg:98.7 F (37.1 C), Min:98.1 F (36.7 C), Max:99.2 F (37.3 C)  Recent Labs  Lab 04/19/22 1309 04/22/22 1746  WBC 8.1 26.9*  CREATININE 1.21 1.39*  LATICACIDVEN  --  2.6*    Estimated Creatinine Clearance: 61.9 mL/min (A) (by C-G formula based on SCr of 1.39 mg/dL (H)).    Allergies  Allergen Reactions   Short Ragweed Pollen Ext Cough    Antimicrobials this admission: 7/13 CTX x1 7/14 cefepime >>  Dose adjustments this admission:   Microbiology results: 7/13 BCx:  7/13 UCx:   Thank you for allowing pharmacy to be a part of this patient's care.  Dolly Rias RPh 04/22/2022, 11:35 PM

## 2022-04-22 NOTE — ED Provider Triage Note (Signed)
Emergency Medicine Provider Triage Evaluation Note  [name] , a 76 y.o. male  was evaluated in triage.  Pt complains of dizziness.  Recently released from rehab 1 week ago after being there for 2 weeks.  He has been having some headaches, blurred vision, dizziness, near syncope, frequent falls.  Feels generally weak.  Was told he likely had a UTI.  Recently restarted on medication for lymphoma which is a rare occurrence.  He feels overall very poorly.  Received IV fluids with EMS.  intermittent episodes of confusion.  Had intermittent dizziness, frequent falls for quite some time.  He was taken off his blood pressure medication due to similar symptoms.  Seen yesterday for UTI. Not on any current abx per patient  Review of Systems  Positive: Dizziness, blurred vision, near syncope, Negative: Fever, sob  Physical Exam  BP (!) 86/58 (BP Location: Right Arm)   Pulse 81   Temp 98.1 F (36.7 C) (Oral)   Resp 18   Ht 6' 3.5" (1.918 m)   Wt 113.4 kg   SpO2 96%   BMI 30.84 kg/m  Gen:   Awake, no distress   Resp:  Normal effort  MSK:   Moves extremities without difficulty  Other:  CN 2-12 grossly intact, equal hand grip  Medical Decision Making  Medically screening exam initiated at 4:38 PM.  Appropriate orders placed.  [name] was informed that the remainder of the evaluation will be completed by another provider, this initial triage assessment does not replace that evaluation, and the importance of remaining in the ED until their evaluation is complete  Dizziness, blurred vision, headaches  Patient is not a code stroke, no LVO criteria.  Patient is hypotensive.  Suspect this is the cause of his dizziness.  Charge nurse aware patient needs room in back.   [name] A, PA-C 04/22/22 1641

## 2022-04-22 NOTE — ED Triage Notes (Signed)
Per [facility] pt coming from home c/o weakness and exertional dizziness over the past 4 days. Recently admitted to hospital for UTI. Reports intermittent dysuria. EMS vitals 94/50, HR 60, 97% RA CBG 175 and given 150CC NS.

## 2022-04-22 NOTE — ED Notes (Signed)
Pt care taken, no complaints at this time. 

## 2022-04-22 NOTE — ED Provider Notes (Signed)
[facility] DEPT Provider Note   CSN: [id number] Arrival date & time: 04/22/22  1621     History  Chief Complaint  Patient presents with   Weakness    SAEL FURCHES is a 76 y.o. male.  Patient comes in with generalized weakness, dizziness when standing intermittently the last several days.  History of lymphoma, restarted chemotherapy recently with his last infusion on Monday.  Last several days he has been having some intermittent dizziness and weakness especially when he stands.  Has not had any falls.  Denies any fevers or chills or nausea or vomiting.  He has had decreased p.o. intake.  Recently was treated for urinary tract infection.  Denies any abdominal pain, pain with urination.  Denies any cough or sputum production.  No current headache, dizziness or neck pain.  He denies any black or bloody stools.  History of A-fib, heart failure, lymphoma currently on chemotherapy.  The history is provided by the patient.       Home Medications Prior to Admission medications   Medication Sig Start Date End Date Taking? Authorizing Provider  acetaminophen (TYLENOL) 325 MG tablet Take 2 tablets (650 mg total) by mouth every 6 (six) hours as needed for mild pain (or Fever >/= 101). 03/25/22   Raiford Noble Latif, DO  albuterol (VENTOLIN HFA) 108 (90 Base) MCG/ACT inhaler Inhale 2 puffs into the lungs every 6 (six) hours as needed for wheezing or shortness of breath.    [provider]  alfuzosin (UROXATRAL) 10 MG 24 hr tablet TAKE 1 TABLET AT BEDTIME Patient taking differently: 10 mg at bedtime. 03/19/21   McKenzie, Candee Furbish, MD  allopurinol (ZYLOPRIM) 100 MG tablet Take 1 tablet (100 mg total) by mouth 2 (two) times daily. 03/19/22   Brunetta Genera, MD  amoxicillin (AMOXIL) 500 MG capsule Take 2,000 mg by mouth daily as needed (dental appointments).    [provider]  aspirin EC 81 MG tablet Take 81 mg by mouth daily.    [provider]  atorvastatin (LIPITOR) 80 MG tablet Take 80 mg by mouth daily.    [provider]  buPROPion (WELLBUTRIN XL) 300 MG 24 hr tablet TAKE 1 TABLET EVERY DAY WITH BREAKFAST Patient taking differently: Take 300 mg by mouth daily. 02/20/21   Plotnikov, Evie Lacks, MD  Calcium Citrate-Vitamin D (CALCIUM CITRATE + D PO) Take 1 tablet by mouth 2 (two) times daily. '1200mg'$  of Calcium and 1000units vitamin D3    [provider]  Cholecalciferol (VITAMIN D-3 PO) Take 2,000 Units by mouth 2 (two) times daily.     [provider]  clonazePAM (KLONOPIN) 1 MG tablet Take 1 tablet (1 mg total) by mouth every 6 (six) hours as needed for anxiety. Take 1 tablet (1 mg total) by mouth every 6 (six) hours as needed. for anxiety Strength: 1 mg 03/26/22   Sheikh, Omair Latif, DO  CRANBERRY PO Take 2 tablets by mouth daily.    [provider]  denosumab (PROLIA) 60 MG/ML SOSY injection Inject 60 mg as directed every 6 (six) months. Last injection: Feb 2020 Next injection: August 2020 12/10/15   [provider]  dexamethasone (DECADRON) 4 MG tablet Take 5 tablets (20 mg total) by mouth daily. Start the day after carboplatin chemotherapy for 3 days. 03/19/22   Brunetta Genera, MD  diltiazem (CARDIZEM CD) 240 MG 24 hr capsule TAKE 1 CAPSULE EVERY DAY Patient taking differently: Take 240 mg by mouth  daily. 03/23/21   Plotnikov, Evie Lacks, MD  Docusate Sodium 100 MG capsule Take 100 mg by mouth 2 (two) times daily.    [provider]  DULoxetine (CYMBALTA) 60 MG capsule TAKE 1 CAPSULE TWICE DAILY Patient taking differently: Take 60 mg by mouth 2 (two) times daily. 03/23/21   Plotnikov, Evie Lacks, MD  EPINEPHrine 0.3 mg/0.3 mL IJ SOAJ injection Inject 0.3 mg into the muscle as needed for anaphylaxis. 07/31/21   [provider]  ezetimibe (ZETIA) 10 MG tablet TAKE 1 TABLET EVERY DAY 08/10/21   Skeet Latch, MD  feeding supplement (ENSURE ENLIVE / ENSURE  PLUS) LIQD Take 237 mLs by mouth 2 (two) times daily between meals. 03/25/22   Raiford Noble Latif, DO  ferrous sulfate 325 (65 FE) MG tablet Take 325 mg by mouth 2 (two) times daily with a meal.     [provider]  hydrochlorothiazide (HYDRODIURIL) 12.5 MG tablet TAKE 1 TABLET (12.5 MG TOTAL) BY MOUTH DAILY. 06/23/21   Loel Dubonnet, NP  HYDROcodone-acetaminophen (NORCO) 7.5-325 MG tablet Take 1 tablet by mouth every 6 (six) hours as needed for severe pain. 03/26/22   Raiford Noble Latif, DO  isosorbide mononitrate (IMDUR) 30 MG 24 hr tablet TAKE 1 TABLET (30 MG TOTAL) BY MOUTH DAILY. 08/10/21   Skeet Latch, MD  levothyroxine (SYNTHROID) 100 MCG tablet Take 100 mcg by mouth daily before breakfast.    [provider]  lidocaine-prilocaine (EMLA) cream Apply 1 Application topically as needed. 04/21/22   Brunetta Genera, MD  loratadine (CLARITIN) 10 MG tablet Take 10 mg by mouth daily.     [provider]  losartan (COZAAR) 50 MG tablet TAKE 1 TABLET EVERY DAY Patient taking differently: Take 50 mg by mouth daily. 06/18/21   Skeet Latch, MD  Magnesium 500 MG CAPS Take 1 capsule (500 mg total) by mouth daily. Patient taking differently: Take 500 mg by mouth every evening. 10/13/18   Brunetta Genera, MD  metoprolol tartrate (LOPRESSOR) 25 MG tablet Take 1 tablet (25 mg total) by mouth 2 (two) times daily. 08/18/21   Skeet Latch, MD  Multiple Vitamin (MULTIVITAMIN WITH MINERALS) TABS tablet Take 1 tablet by mouth daily. Men's One-A-Day 50+    [provider]  MYRBETRIQ 25 MG TB24 tablet TAKE 1 TABLET EVERY DAY Patient taking differently: Take 25 mg by mouth daily. 10/06/21   McKenzie, Candee Furbish, MD  nitroGLYCERIN (NITROSTAT) 0.4 MG SL tablet Place 0.4 mg under the tongue every 5 (five) minutes as needed for chest pain. x3 doses as needed for chest pain    [provider]  omeprazole (PRILOSEC) 40 MG capsule TAKE 1 [place] BREAKFAST Patient taking differently: Take 40 mg by mouth daily. TAKE 1 CAPSULE EVERY DAY BEFORE BREAKFAST 06/18/21   Plotnikov, Evie Lacks, MD  ondansetron (ZOFRAN) 8 MG tablet Take 1 tablet (8 mg total) by mouth 2 (two) times daily as needed. Start on the 3rd day after cisplatin chemotherapy. 03/19/22   Brunetta Genera, MD  Potassium Citrate 15 MEQ (1620 MG) TBCR TAKE 1 TABLET TWICE DAILY Patient taking differently: Take 1 tablet by mouth in the morning and at bedtime. 03/19/21   McKenzie, Candee Furbish, MD  Probiotic Product (PROBIOTIC PO) Take 1 capsule by mouth daily with breakfast.    [provider]  prochlorperazine (COMPAZINE) 10 MG tablet Take 1 tablet (10 mg total) by mouth every 6 (six) hours as needed (Nausea or vomiting). 03/19/22  Brunetta Genera, MD  rivaroxaban (XARELTO) 10 MG TABS tablet Take 1 tablet (10 mg total) by mouth daily. 06/29/21   Plotnikov, Evie Lacks, MD  solifenacin (VESICARE) 10 MG tablet Take 10 mg by mouth daily. 08/24/21   [provider]  testosterone enanthate (DELATESTRYL) 200 MG/ML injection INJECT 1ML ('200MG'$ ) INTO THE MUSCLE EVERY 14 DAYS. DISCARD VIAL AFTER 28 DAYS. Patient taking differently: 200 mg every 14 (fourteen) days. INJECT 1ML ('200MG'$ ) INTO THE MUSCLE EVERY 14 DAYS. DISCARD VIAL AFTER 28 DAYS. 09/22/21   Plotnikov, Evie Lacks, MD  vitamin C (ASCORBIC ACID) 500 MG tablet Take 500 mg by mouth daily.    [provider]      Allergies    Short ragweed pollen ext    Review of Systems   Review of Systems  Physical Exam Updated Vital Signs BP (!) 108/56   Pulse 76   Temp 99.2 F (37.3 C) (Rectal)   Resp (!) 23   Ht 6' 3.5" (1.918 m)   Wt 113.4 kg   SpO2 94%   BMI 30.84 kg/m  Physical Exam Vitals and nursing note reviewed.  Constitutional:      General: He is not in acute distress.    Appearance: He is well-developed. He is not ill-appearing.  HENT:     Head: Normocephalic and atraumatic.     Nose: Nose  normal.     Mouth/Throat:     Mouth: Mucous membranes are dry.  Eyes:     Extraocular Movements: Extraocular movements intact.     Conjunctiva/sclera: Conjunctivae normal.     Pupils: Pupils are equal, round, and reactive to light.  Cardiovascular:     Rate and Rhythm: Normal rate and regular rhythm.     Heart sounds: No murmur heard. Pulmonary:     Effort: Pulmonary effort is normal. No respiratory distress.     Breath sounds: Normal breath sounds.  Abdominal:     General: Abdomen is flat. There is no distension.     Palpations: Abdomen is soft.     Tenderness: There is no abdominal tenderness.  Genitourinary:    Rectum: Guaiac result negative.  Musculoskeletal:        General: No swelling.     Cervical back: Normal range of motion and neck supple.  Skin:    General: Skin is warm and dry.     Capillary Refill: Capillary refill takes less than 2 seconds.  Neurological:     General: No focal deficit present.     Mental Status: He is alert and oriented to person, place, and time.     Cranial Nerves: No cranial nerve deficit.     Sensory: No sensory deficit.     Motor: No weakness.     Coordination: Coordination normal.  Psychiatric:        Mood and Affect: Mood normal.     ED Results / Procedures / Treatments   Labs (all labs ordered are listed, but only abnormal results are displayed) Labs Reviewed  CBC WITH DIFFERENTIAL/PLATELET - Abnormal; Notable for the following components:      Result Value   WBC 26.9 (*)    RBC 3.11 (*)    Hemoglobin 10.2 (*)    HCT 29.0 (*)    RDW 16.5 (*)    Platelets 126 (*)    Neutro Abs 26.1 (*)    Lymphs Abs 0.5 (*)    All other components within normal limits  BASIC METABOLIC PANEL - Abnormal; Notable  for the following components:   Sodium 132 (*)    CO2 21 (*)    Glucose, Bld 159 (*)    BUN 44 (*)    Creatinine, Ser 1.39 (*)    Calcium 8.2 (*)    GFR, Estimated 53 (*)    All other components within normal limits  LACTIC ACID,  PLASMA - Abnormal; Notable for the following components:   Lactic Acid, Venous 2.6 (*)    All other components within normal limits  URINALYSIS, ROUTINE W REFLEX MICROSCOPIC - Abnormal; Notable for the following components:   APPearance HAZY (*)    Hgb urine dipstick SMALL (*)    Leukocytes,Ua LARGE (*)    WBC, UA >50 (*)    Bacteria, UA RARE (*)    All other components within normal limits  HEPATIC FUNCTION PANEL - Abnormal; Notable for the following components:   Total Protein 5.5 (*)    Albumin 2.7 (*)    ALT 57 (*)    All other components within normal limits  CULTURE, BLOOD (ROUTINE X 2)  CULTURE, BLOOD (ROUTINE X 2)  URINE CULTURE  LIPASE, BLOOD  LACTIC ACID, PLASMA  POC OCCULT BLOOD, ED  TROPONIN I (HIGH SENSITIVITY)  TROPONIN I (HIGH SENSITIVITY)    EKG EKG Interpretation  Date/Time:  Thursday April 22 2022 16:39:55 EDT Ventricular Rate:  70 PR Interval:  188 QRS Duration: 114 QT Interval:  404 QTC Calculation: 436 R Axis:   -47 Text Interpretation: Atrial fibrillation LAD, consider left anterior fascicular block Low voltage, precordial leads Confirmed by Lennice Sites ([id number]) on 04/22/2022 5:21:38 PM  Radiology CT Angio Chest PE W and/or Wo Contrast  Result Date: 04/22/2022 CLINICAL DATA:  Concern for pulmonary embolism. Diffuse large B-cell lymphoma. EXAM: CT ANGIOGRAPHY CHEST WITH CONTRAST TECHNIQUE: Multidetector CT imaging of the chest was performed using the standard protocol during bolus administration of intravenous contrast. Multiplanar CT image reconstructions and MIPs were obtained to evaluate the vascular anatomy. RADIATION DOSE REDUCTION: This exam was performed according to the departmental dose-optimization program which includes automated exposure control, adjustment of the mA and/or kV according to patient size and/or use of iterative reconstruction technique. CONTRAST:  126m OMNIPAQUE IOHEXOL 350 MG/ML SOLN COMPARISON:  Chest radiograph dated 04/22/2022  and CT dated 12/11/2016. PET CT dated 03/02/2022. FINDINGS: Evaluation of this exam is limited due to respiratory motion artifact. Cardiovascular: There is no cardiomegaly or pericardial effusion. There is coronary vascular calcification. Moderate atherosclerotic calcification of the thoracic aorta. No aneurysmal dilatation or dissection. Evaluation of the pulmonary arteries is limited due to respiratory motion artifact and suboptimal visualization of the peripheral branches. No central pulmonary artery embolus identified. Right-sided Port-A-Cath with tip in the central SVC. Mediastinum/Nodes: Mildly enlarged lymph node to the left of the esophagus measures 11 mm short axis (27/4). No hilar or other mediastinal adenopathy. The esophagus is grossly unremarkable. No mediastinal fluid collection. Lungs/Pleura: There is a 2.7 x 2.5 cm right lower lobe nodule concerning for malignancy. No focal consolidation, pleural effusion, or pneumothorax. The central airways are patent. Upper Abdomen: Bilateral adrenal thickening. Musculoskeletal: Osteopenia with degenerative changes of the spine. Old healed right rib fractures. Fractures of the T5-T10 spinous process. Review of the MIP images confirms the above findings. IMPRESSION: 1. No CT evidence of central pulmonary artery embolus. 2. Right lower lobe nodule concerning for malignancy. 3. Mildly enlarged left paraesophageal lymph node. 4. Fractures of the T5-T10 spinous process. 5. Aortic Atherosclerosis (ICD10-I70.0). Electronically Signed   By: AMilas Hock  Radparvar M.D.   On: 04/22/2022 21:11   CT HEAD WO CONTRAST (5MM)  Result Date: 04/22/2022 CLINICAL DATA:  Dizziness EXAM: CT HEAD WITHOUT CONTRAST TECHNIQUE: Contiguous axial images were obtained from the base of the skull through the vertex without intravenous contrast. RADIATION DOSE REDUCTION: This exam was performed according to the departmental dose-optimization program which includes automated exposure control,  adjustment of the mA and/or kV according to patient size and/or use of iterative reconstruction technique. COMPARISON:  03/22/2022 FINDINGS: Brain: There is atrophy and chronic small vessel disease changes. No acute intracranial abnormality. Specifically, no hemorrhage, hydrocephalus, mass lesion, acute infarction, or significant intracranial injury. Vascular: No hyperdense vessel or unexpected calcification. Skull: No acute calvarial abnormality. Sinuses/Orbits: No acute findings Other: None IMPRESSION: Atrophy, chronic microvascular disease. No acute intracranial abnormality. Electronically Signed   By: Rolm Baptise M.D.   On: 04/22/2022 17:16   DG Chest Portable 1 View  Result Date: 04/22/2022 CLINICAL DATA:  dizziness, hypotension EXAM: PORTABLE CHEST 1 VIEW COMPARISON:  Mar 06, 2022 FINDINGS: Cardiomediastinal silhouette is mildly prominent and stable. Thoracic aorta is ectatic. Again seen is the right-sided Port-A-Cath with its tip at the upper SVC region. There is no pulmonary consolidation, pleural effusion or vascular congestion. The visualized skeletal structures are unremarkable. IMPRESSION: No active disease. Electronically Signed   By: Frazier Richards M.D.   On: 04/22/2022 17:04    Procedures Procedures    Medications Ordered in ED Medications  lactated ringers infusion ( Intravenous New Bag/Given 04/22/22 2043)  cefTRIAXone (ROCEPHIN) 1 g in sodium chloride 0.9 % 100 mL IVPB (1 g Intravenous New Bag/Given 04/22/22 2144)  lactated ringers bolus 1,000 mL (0 mLs Intravenous Stopped 04/22/22 1852)  iohexol (OMNIPAQUE) 350 MG/ML injection 100 mL (100 mLs Intravenous Contrast Given 04/22/22 2019)    ED Course/ Medical Decision Making/ A&P                           Medical Decision Making Amount and/or Complexity of Data Reviewed Labs: ordered. Radiology: ordered.  Risk Prescription drug management. Decision regarding hospitalization.   ANIRUDDH CIAVARELLA is here with dizziness and  weakness.  History of atrial fibrillation on Xarelto, history of heart failure, lymphoma currently on chemotherapy.  Blood pressure 80 systolic upon arrival.  Rectal temperature is 99.2.  When patient is lying down/sitting up his blood pressure is normal at 112/80.  When he stands up blood pressure drops into the 80s.  He is orthostatic positive.  He has been having some low blood pressures this week.  Had chemotherapy on Monday and had IV fluids for low blood pressure.  Denies any black stools or bloody stools.  He does admit to decreased p.o. intake.  Differential diagnosis is possibly infectious process versus dehydration/AKI.  Lower suspicion for PE or other significant cardiac or pulmonary process.  We will pursue infectious work-up/cardiopulmonary work-up.  We will get CBC, CMP, lactic acid, blood cultures, troponin, head CT, chest x-ray.  EKG shows sinus rhythm.  No ischemic changes.  Will give IV fluid bolus.  Anticipate admission.  Patient with brown stool on exam.  Hemoccult negative.  Doubt bleed.  Per my review and interpretation of labs white count is 26.9.  Of note he did get growth stimulating factor this week.  Lactic acid is 2.6.  Head CT unremarkable.  Chest x-ray per my review interpretation shows no obvious pneumonia.  Hemoccult is negative.  Troponin within normal limits.  At  this time I do not have any source for infection.  He is not neutropenic.  My suspicion is that this could be more of dehydration driven.  We will get CT scan of chest to further rule out infectious process/PE.  Still awaiting urinalysis.  Blood pressure has stabilized following some IV fluids.  Urinalysis possibly concerning for infection.  We will give a dose IV Rocephin.  CT scan of the chest is unremarkable.  Overall not quite sure if this is sepsis but given his history we will need to admit to rule out out.  Suspect that this is likely dehydration.  This chart was dictated using voice recognition software.   Despite best efforts to proofread,  errors can occur which can change the documentation meaning.         Final Clinical Impression(s) / ED Diagnoses Final diagnoses:  Acute cystitis without hematuria    Rx / DC Orders ED Discharge Orders     None         Lennice Sites, DO 04/22/22 2201

## 2022-04-23 ENCOUNTER — Encounter: Payer: Self-pay | Admitting: Hematology

## 2022-04-23 ENCOUNTER — Encounter (HOSPITAL_COMMUNITY): Payer: Self-pay | Admitting: Family Medicine

## 2022-04-23 DIAGNOSIS — A419 Sepsis, unspecified organism: Secondary | ICD-10-CM | POA: Diagnosis not present

## 2022-04-23 DIAGNOSIS — N390 Urinary tract infection, site not specified: Secondary | ICD-10-CM | POA: Diagnosis not present

## 2022-04-23 LAB — BASIC METABOLIC PANEL
Anion gap: 9 (ref 5–15)
BUN: 34 mg/dL — ABNORMAL HIGH (ref 8–23)
CO2: 24 mmol/L (ref 22–32)
Calcium: 8.3 mg/dL — ABNORMAL LOW (ref 8.9–10.3)
Chloride: 105 mmol/L (ref 98–111)
Creatinine, Ser: 1.03 mg/dL (ref 0.61–1.24)
GFR, Estimated: >60 mL/min (ref >60)
Glucose, Bld: 127 mg/dL — ABNORMAL HIGH (ref 70–99)
Potassium: 4.6 mmol/L (ref 3.5–5.1)
Sodium: 138 mmol/L (ref 135–145)

## 2022-04-23 LAB — CBC
HCT: 29.4 % — ABNORMAL LOW (ref 39.0–52.0)
Hemoglobin: 10.2 g/dL — ABNORMAL LOW (ref 13.0–17.0)
MCH: 32.5 pg (ref 26.0–34.0)
MCHC: 34.7 g/dL (ref 30.0–36.0)
MCV: 93.6 fL (ref 80.0–100.0)
Platelets: 102 10*3/uL — ABNORMAL LOW (ref 150–400)
RBC: 3.14 MIL/uL — ABNORMAL LOW (ref 4.22–5.81)
RDW: 16.3 % — ABNORMAL HIGH (ref 11.5–15.5)
WBC: 24 10*3/uL — ABNORMAL HIGH (ref 4.0–10.5)
nRBC: 0 % (ref 0.0–0.2)

## 2022-04-23 LAB — HEPATIC FUNCTION PANEL
ALT: 54 U/L — ABNORMAL HIGH (ref 0–44)
AST: 25 U/L (ref 15–41)
Albumin: 2.8 g/dL — ABNORMAL LOW (ref 3.5–5.0)
Alkaline Phosphatase: 59 U/L (ref 38–126)
Bilirubin, Direct: 0.2 mg/dL (ref 0.0–0.2)
Indirect Bilirubin: 0.8 mg/dL (ref 0.3–0.9)
Total Bilirubin: 1 mg/dL (ref 0.3–1.2)
Total Protein: 5.4 g/dL — ABNORMAL LOW (ref 6.5–8.1)

## 2022-04-23 MED ORDER — PANTOPRAZOLE SODIUM 40 MG PO TBEC
40.0000 mg | DELAYED_RELEASE_TABLET | Freq: Every day | ORAL | Status: DC
  Administered 2022-04-23 – 2022-04-27 (×5): 40 mg via ORAL
  Filled 2022-04-23 (×5): qty 1

## 2022-04-23 MED ORDER — ONDANSETRON HCL 4 MG/2ML IJ SOLN: 4.0000 mg | Freq: Four times a day (QID) | INTRAMUSCULAR | Status: DC | PRN

## 2022-04-23 MED ORDER — DULOXETINE HCL 60 MG PO CPEP
60.0000 mg | ORAL_CAPSULE | Freq: Two times a day (BID) | ORAL | Status: DC
  Administered 2022-04-23 – 2022-04-27 (×9): 60 mg via ORAL
  Filled 2022-04-23 (×3): qty 1
  Filled 2022-04-23: qty 2
  Filled 2022-04-23 (×5): qty 1

## 2022-04-23 MED ORDER — MIRABEGRON ER 25 MG PO TB24
25.0000 mg | ORAL_TABLET | Freq: Every day | ORAL | Status: DC
  Administered 2022-04-23 – 2022-04-27 (×5): 25 mg via ORAL
  Filled 2022-04-23 (×5): qty 1

## 2022-04-23 MED ORDER — CLONAZEPAM 1 MG PO TABS
1.0000 mg | ORAL_TABLET | Freq: Four times a day (QID) | ORAL | Status: DC | PRN
  Administered 2022-04-23 – 2022-04-26 (×5): 1 mg via ORAL
  Filled 2022-04-23: qty 1
  Filled 2022-04-23: qty 2
  Filled 2022-04-23 (×3): qty 1

## 2022-04-23 MED ORDER — DARIFENACIN HYDROBROMIDE ER 15 MG PO TB24
15.0000 mg | ORAL_TABLET | Freq: Every day | ORAL | Status: DC
  Administered 2022-04-23 – 2022-04-27 (×5): 15 mg via ORAL
  Filled 2022-04-23 (×5): qty 1

## 2022-04-23 MED ORDER — LACTATED RINGERS IV SOLN: INTRAVENOUS | Status: DC

## 2022-04-23 MED ORDER — LEVOTHYROXINE SODIUM 100 MCG PO TABS
100.0000 ug | ORAL_TABLET | Freq: Every day | ORAL | Status: DC
  Administered 2022-04-23 – 2022-04-27 (×5): 100 ug via ORAL
  Filled 2022-04-23 (×5): qty 1

## 2022-04-23 MED ORDER — SENNOSIDES-DOCUSATE SODIUM 8.6-50 MG PO TABS: 1.0000 | ORAL_TABLET | Freq: Every evening | ORAL | Status: DC | PRN

## 2022-04-23 MED ORDER — ALBUTEROL SULFATE (2.5 MG/3ML) 0.083% IN NEBU: 3.0000 mL | INHALATION_SOLUTION | Freq: Four times a day (QID) | RESPIRATORY_TRACT | Status: DC | PRN

## 2022-04-23 MED ORDER — BUPROPION HCL ER (XL) 300 MG PO TB24
300.0000 mg | ORAL_TABLET | Freq: Every day | ORAL | Status: DC
  Administered 2022-04-23 – 2022-04-27 (×5): 300 mg via ORAL
  Filled 2022-04-23: qty 2
  Filled 2022-04-23 (×4): qty 1

## 2022-04-23 MED ORDER — ATORVASTATIN CALCIUM 40 MG PO TABS
80.0000 mg | ORAL_TABLET | Freq: Every day | ORAL | Status: DC
  Administered 2022-04-23 – 2022-04-27 (×5): 80 mg via ORAL
  Filled 2022-04-23 (×5): qty 2

## 2022-04-23 MED ORDER — ASPIRIN 81 MG PO TBEC
81.0000 mg | DELAYED_RELEASE_TABLET | Freq: Every day | ORAL | Status: DC
  Administered 2022-04-23 – 2022-04-26 (×4): 81 mg via ORAL
  Filled 2022-04-23 (×4): qty 1

## 2022-04-23 MED ORDER — SODIUM CHLORIDE 0.9% FLUSH
3.0000 mL | Freq: Two times a day (BID) | INTRAVENOUS | Status: DC
  Administered 2022-04-23 – 2022-04-26 (×7): 3 mL via INTRAVENOUS

## 2022-04-23 MED ORDER — ACETAMINOPHEN 650 MG RE SUPP: 650.0000 mg | Freq: Four times a day (QID) | RECTAL | Status: DC | PRN

## 2022-04-23 MED ORDER — DILTIAZEM HCL ER COATED BEADS 240 MG PO CP24
240.0000 mg | ORAL_CAPSULE | Freq: Every day | ORAL | Status: DC
  Administered 2022-04-23 – 2022-04-24 (×2): 240 mg via ORAL
  Filled 2022-04-23: qty 2
  Filled 2022-04-23: qty 1

## 2022-04-23 MED ORDER — ALFUZOSIN HCL ER 10 MG PO TB24
10.0000 mg | ORAL_TABLET | Freq: Every day | ORAL | Status: DC
Start: 2022-04-23 — End: 2022-04-27
  Administered 2022-04-23 – 2022-04-26 (×4): 10 mg via ORAL
  Filled 2022-04-23 (×4): qty 1

## 2022-04-23 MED ORDER — ONDANSETRON HCL 4 MG PO TABS: 4.0000 mg | ORAL_TABLET | Freq: Four times a day (QID) | ORAL | Status: DC | PRN

## 2022-04-23 MED ORDER — ACETAMINOPHEN 325 MG PO TABS
650.0000 mg | ORAL_TABLET | Freq: Four times a day (QID) | ORAL | Status: DC | PRN
  Administered 2022-04-23: 650 mg via ORAL
  Filled 2022-04-23: qty 2

## 2022-04-23 MED ORDER — RIVAROXABAN 10 MG PO TABS
10.0000 mg | ORAL_TABLET | Freq: Every day | ORAL | Status: DC
  Administered 2022-04-23 – 2022-04-27 (×5): 10 mg via ORAL
  Filled 2022-04-23 (×5): qty 1

## 2022-04-23 NOTE — Progress Notes (Signed)
PT request to not do cpap tonight, and start tomorrow night.

## 2022-04-23 NOTE — ED Notes (Signed)
Meal tray provided at bedside

## 2022-04-23 NOTE — Progress Notes (Signed)
PROGRESS NOTE    [name]  JOI:[id number] DOB: April 05, 1946 DOA: 04/22/2022 PCP: Cassandria Anger, MD   Brief Narrative: 76 year old with past medical history significant for hypertension, depression, anxiety, CAD, recurrent diffuse large B-cell lymphoma presents to the ED complaining of fatigue, generalized malaise similar to when he has UTI in the past.  He reports 4 days of fatigue malaise and lightheadedness.  He was found to be hypotensive when EMS arrive.  He received IV fluids.  Evaluation in the ED he was found to have leukocytosis 26,000, lactic acidosis at 4.6.  He received IV fluids and IV antibiotics.    Assessment & Plan:   Principal Problem:   Sepsis secondary to UTI [facility]) Active Problems:   Hypothyroidism   Essential hypertension   Chronic obstructive pulmonary disease (HCC)   Diffuse large B-cell lymphoma (HCC)   Paroxysmal atrial fibrillation (HCC)   Depression   CAD (coronary artery disease)   OSA (obstructive sleep apnea)   Generalized anxiety disorder   1-Sepsis secondary to urinary tract infection: Patient presented with malaise fatigue, UA with more than 50 white blood cell, leukocytosis hypotension and lactic acidosis -He received Give IV fluids -Continue with IV fluids maintenance -Continue with IV cefepime -Follow urine blood cultures  2-CAD: Denies chest pain.  Continue with aspirin and statins  3-Hypertension: Continue to hold blood pressure medications.  He was initially hypotensive  4-Diffuse large B-cell lymphoma: - Started chemo with R-GDP on 03/22/22 for recurrence     5-Depression, anxiety: Wellbutrin, Cymbalta: Klonopin  6-COPD, OSA: Continue with albuterol, CPAP nightly  PAF: Continue with Cardizem as blood pressure allows and continue, with  Xarelto.   Anemia of malignancy. Monitor hb.  Thrombocytopenia. Suspect related to acute infectious process.   Estimated body mass index is 30.84 kg/m as calculated from the  following:   Height as of this encounter: 6' 3.5" (1.918 m).   Weight as of this encounter: 113.4 kg.   DVT prophylaxis: Xarelto Code Status: DNR Family Communication: Care discussed with patient Disposition Plan:  Status is: Inpatient Remains inpatient appropriate because: needs IV fluid and IV antibiotics.     Consultants:  Dr Irene Limbo added to treatment team.   Procedures:  none  Antimicrobials:  Cefepime   Subjective: He seems tired. He denies pain.  He wants to go home, but he understand he is not ready for discharge   Objective: Vitals:   04/23/22 0300 04/23/22 0400 04/23/22 0500 04/23/22 0600  BP: (!) 119/55 (!) 109/58 123/64 122/73  Pulse: 76 76 78 80  Resp: '16 17 16 18  '$ Temp:      TempSrc:      SpO2: 94% 98% 93% 96%  Weight:      Height:        Intake/Output Summary (Last 24 hours) at 04/23/2022 0837 Last data filed at 04/22/2022 1852 Gross per 24 hour  Intake 1000 ml  Output --  Net 1000 ml   Filed Weights   04/22/22 1637  Weight: 113.4 kg    Examination:  General exam: Appears calm and comfortable  Respiratory system: Clear to auscultation. Respiratory effort normal. Cardiovascular system: S1 & S2 heard, RRR.  Gastrointestinal system: Abdomen is nondistended, soft and nontender. No organomegaly or masses felt. Normal bowel sounds heard. Central nervous system: Alert and oriented Extremities: Symmetric 5 x 5 power.    Data Reviewed: I have personally reviewed following labs and imaging studies  CBC: Recent Labs  Lab 04/19/22 1309 04/22/22 1746 04/23/22  0418  WBC 8.1 26.9* 24.0*  NEUTROABS 7.0 26.1*  --   HGB 12.4* 10.2* 10.2*  HCT 35.7* 29.0* 29.4*  MCV 90.4 93.2 93.6  PLT 184 126* 272*   Basic Metabolic Panel: Recent Labs  Lab 04/19/22 1309 04/22/22 1746 04/23/22 0418  NA 132* 132* 138  K 4.0 4.7 4.6  CL 97* 103 105  CO2 30 21* 24  GLUCOSE 136* 159* 127*  BUN 35* 44* 34*  CREATININE 1.21 1.39* 1.03  CALCIUM 8.7* 8.2* 8.3*    GFR: Estimated Creatinine Clearance: 83.5 mL/min (by C-G formula based on SCr of 1.03 mg/dL). Liver Function Tests: Recent Labs  Lab 04/19/22 1309 04/22/22 1746 04/23/22 0418  AST '29 29 25  '$ ALT 78* 57* 54*  ALKPHOS 65 60 59  BILITOT 1.5* 1.0 1.0  PROT 5.9* 5.5* 5.4*  ALBUMIN 3.2* 2.7* 2.8*   Recent Labs  Lab 04/22/22 1746  LIPASE 24   No results for input(s): "AMMONIA" in the last 168 hours. Coagulation Profile: No results for input(s): "INR", "PROTIME" in the last 168 hours. Cardiac Enzymes: No results for input(s): "CKTOTAL", "CKMB", "CKMBINDEX", "TROPONINI" in the last 168 hours. BNP (last 3 results) No results for input(s): "PROBNP" in the last 8760 hours. HbA1C: No results for input(s): "HGBA1C" in the last 72 hours. CBG: No results for input(s): "GLUCAP" in the last 168 hours. Lipid Profile: No results for input(s): "CHOL", "HDL", "LDLCALC", "TRIG", "CHOLHDL", "LDLDIRECT" in the last 72 hours. Thyroid Function Tests: No results for input(s): "TSH", "T4TOTAL", "FREET4", "T3FREE", "THYROIDAB" in the last 72 hours. Anemia Panel: No results for input(s): "VITAMINB12", "FOLATE", "FERRITIN", "TIBC", "IRON", "RETICCTPCT" in the last 72 hours. Sepsis Labs: Recent Labs  Lab 04/22/22 1746  LATICACIDVEN 2.6*    Recent Results (from the past 240 hour(s))  Blood culture (routine x 2)     Status: None (Preliminary result)   Collection Time: 04/22/22  5:46 PM   Specimen: BLOOD  Result Value Ref Range Status   Specimen Description   Final    BLOOD BLOOD RIGHT FOREARM Performed at [facility] [address], [place], [place] [address]    Special Requests   Final    BOTTLES DRAWN AEROBIC AND ANAEROBIC Blood Culture results may not be optimal due to an excessive volume of blood received in culture bottles Performed at [facility] [address], [place], [place] [address]    Culture   Final    NO GROWTH < 12 HOURS Performed  at [facility] [address] [place], [place] [address]    Report Status PENDING  Incomplete  Blood culture (routine x 2)     Status: None (Preliminary result)   Collection Time: 04/22/22  5:58 PM   Specimen: BLOOD  Result Value Ref Range Status   Specimen Description   Final    BLOOD BLOOD RIGHT WRIST Performed at [facility] [address] [place], [place] [address]    Special Requests   Final    BOTTLES DRAWN AEROBIC AND ANAEROBIC Blood Culture adequate volume Performed at [facility] [address], [place], [place] [address]    Culture   Final    NO GROWTH < 12 HOURS Performed at [facility] [address] [place], [place] [address]    Report Status PENDING  Incomplete         Radiology Studies: CT Angio Chest PE W and/or Wo Contrast  Result Date:  04/22/2022 CLINICAL DATA:  Concern for pulmonary embolism. Diffuse large B-cell lymphoma. EXAM: CT ANGIOGRAPHY CHEST WITH CONTRAST TECHNIQUE: Multidetector CT imaging of the chest was performed using the standard protocol during bolus administration of intravenous contrast. Multiplanar CT image reconstructions and MIPs were obtained to evaluate the vascular anatomy. RADIATION DOSE REDUCTION: This exam was performed according to the departmental dose-optimization program which includes automated exposure control, adjustment of the mA and/or kV according to patient size and/or use of iterative reconstruction technique. CONTRAST:  112m OMNIPAQUE IOHEXOL 350 MG/ML SOLN COMPARISON:  Chest radiograph dated 04/22/2022 and CT dated 12/11/2016. PET CT dated 03/02/2022. FINDINGS: Evaluation of this exam is limited due to respiratory motion artifact. Cardiovascular: There is no cardiomegaly or pericardial effusion. There is coronary vascular calcification. Moderate atherosclerotic calcification of the thoracic aorta. No aneurysmal dilatation or dissection. Evaluation of the pulmonary  arteries is limited due to respiratory motion artifact and suboptimal visualization of the peripheral branches. No central pulmonary artery embolus identified. Right-sided Port-A-Cath with tip in the central SVC. Mediastinum/Nodes: Mildly enlarged lymph node to the left of the esophagus measures 11 mm short axis (27/4). No hilar or other mediastinal adenopathy. The esophagus is grossly unremarkable. No mediastinal fluid collection. Lungs/Pleura: There is a 2.7 x 2.5 cm right lower lobe nodule concerning for malignancy. No focal consolidation, pleural effusion, or pneumothorax. The central airways are patent. Upper Abdomen: Bilateral adrenal thickening. Musculoskeletal: Osteopenia with degenerative changes of the spine. Old healed right rib fractures. Fractures of the T5-T10 spinous process. Review of the MIP images confirms the above findings. IMPRESSION: 1. No CT evidence of central pulmonary artery embolus. 2. Right lower lobe nodule concerning for malignancy. 3. Mildly enlarged left paraesophageal lymph node. 4. Fractures of the T5-T10 spinous process. 5. Aortic Atherosclerosis (ICD10-I70.0). Electronically Signed   By: AAnner CreteM.D.   On: 04/22/2022 21:11   CT HEAD WO CONTRAST (5MM)  Result Date: 04/22/2022 CLINICAL DATA:  Dizziness EXAM: CT HEAD WITHOUT CONTRAST TECHNIQUE: Contiguous axial images were obtained from the base of the skull through the vertex without intravenous contrast. RADIATION DOSE REDUCTION: This exam was performed according to the departmental dose-optimization program which includes automated exposure control, adjustment of the mA and/or kV according to patient size and/or use of iterative reconstruction technique. COMPARISON:  03/22/2022 FINDINGS: Brain: There is atrophy and chronic small vessel disease changes. No acute intracranial abnormality. Specifically, no hemorrhage, hydrocephalus, mass lesion, acute infarction, or significant intracranial injury. Vascular: No  hyperdense vessel or unexpected calcification. Skull: No acute calvarial abnormality. Sinuses/Orbits: No acute findings Other: None IMPRESSION: Atrophy, chronic microvascular disease. No acute intracranial abnormality. Electronically Signed   By: KRolm BaptiseM.D.   On: 04/22/2022 17:16   DG Chest Portable 1 View  Result Date: 04/22/2022 CLINICAL DATA:  dizziness, hypotension EXAM: PORTABLE CHEST 1 VIEW COMPARISON:  Mar 06, 2022 FINDINGS: Cardiomediastinal silhouette is mildly prominent and stable. Thoracic aorta is ectatic. Again seen is the right-sided Port-A-Cath with its tip at the upper SVC region. There is no pulmonary consolidation, pleural effusion or vascular congestion. The visualized skeletal structures are unremarkable. IMPRESSION: No active disease. Electronically Signed   By: AFrazier RichardsM.D.   On: 04/22/2022 17:04        Scheduled Meds:  alfuzosin  10 mg Oral QHS   aspirin EC  81 mg Oral Daily   atorvastatin  80 mg Oral Daily   buPROPion  300 mg Oral Daily   darifenacin  15 mg Oral Daily  diltiazem  240 mg Oral Daily   DULoxetine  60 mg Oral BID   levothyroxine  100 mcg Oral Q0600   mirabegron ER  25 mg Oral Daily   pantoprazole  40 mg Oral Daily   rivaroxaban  10 mg Oral Daily   sodium chloride flush  3 mL Intravenous Q12H   Continuous Infusions:  ceFEPime (MAXIPIME) IV Stopped (04/23/22 0235)   lactated ringers       LOS: 1 day    Time spent: 35 minutes    Joseph Dasch A Lunette Tapp, MD [facility]   If 7PM-7AM, please contact night-coverage [email or web address]  04/23/2022, 8:37 AM

## 2022-04-23 NOTE — Progress Notes (Addendum)
Pt care assumed from previous RN at 1500 this shift. Pt is resting comfortably at this time and is in no acute distress. Will continue to monitor

## 2022-04-23 NOTE — H&P (Signed)
History and Physical    WYETH HOFFER GXQ:[id number] DOB: 1946-04-19 DOA: 04/22/2022  PCP: Cassandria Anger, MD   Patient coming from: Home   Chief Complaint: Fatigue, lightheaded   HPI: [name] is a pleasant 76 y.o. male with medical history significant for hypertension, depression, anxiety, CAD, and recurrent diffuse large B-cell lymphoma, presenting to the emergency department with fatigue and general malaise similar to when he has had UTI in the past.  Patient reports 4 days of fatigue, malaise, and lightheadedness at times.  He denies any fevers or chills, and denies flank pain.  He was hypotensive with EMS and given IV fluids prior to arrival in the ED.  ED Course: Upon arrival to the ED, patient is found to be afebrile and saturating well on room air with tachypnea and SBP in the 80s.  Chemistry panel notable for sodium 132 and creatinine 1.39.  CBC features a leukocytosis to 26,900.  Lactic acid is elevated to 4.6.  Patient was given a liter of LR and 1 g IV Rocephin in the ED.  Review of Systems:  All other systems reviewed and apart from HPI, are negative.  Past Medical History:  Diagnosis Date   Anticoagulant long-term use    xarelto--- takes for AFib,  managed by pcp, Dr Alain Marion   Benign localized prostatic hyperplasia with lower urinary tract symptoms (LUTS)    CAD (coronary artery disease) cardiologist--  dr t. Oval Linsey   hx of known CAD obstruction w/ collaterals (cath done @ [facility] 12-31-2011) ;   last cardiac cath 08-13-2019  showed sig. 2V CAD involving proxLAD and CTO of the RCA/  chronic total occlusion midRCA w/ bridging and L>R collaterals   Diastolic CHF ([place])    dx 14-48-1856 hospital admission (followed by pcp)   Diffuse large B cell lymphoma [facility]) oncologist-- dr Irene Limbo--- in remission   dx 08/ 2019 -- bx left tonsill mass-- involving lymph nodes--- completed chemotherapy 10-12-2018   DJD (degenerative joint disease)    Dyspnea    Dysthymic  disorder    Elevated brain natriuretic peptide (BNP) level    Environmental allergies    GAD (generalized anxiety disorder)    GERD (gastroesophageal reflux disease)    History of falling    multiple times   History of Graves' disease    1987 s/p total thyroidectomy   History of kidney stones    History of scabies    07/ 2014  resolved   History of syncope    per D/C note in epic 02-21-2013 ?vasovagal   History of vertebral compression fracture    05/ 2014 --  L1, no surgical intervention   HLD (hyperlipidemia)    Hyperlipidemia    Hypertension    Hypogonadism in male    IDA (iron deficiency anemia)    Lumbago    OA (osteoarthritis)    OSA on CPAP    cpap machine-settings 17   Osteomyelitis of right wrist ([place]) 11/02/2012   Osteoporosis    Severe   Osteoporosis with fracture 04/24/2013   Pathological fracture due to osteoporosis with delayed healing 04/24/2013   Persistent atrial fibrillation [facility])    cardiologist--- dr Alfonso Patten. [place]   Plaque psoriasis    dermatologist--- dr Harriett Sine--- currently taking otezla   Post-surgical hypothyroidism    followed by pcp---  s/p total thyroidectomy for graves disease in 1987   Pure hypercholesterolemia 02/10/2021   Renal calculus, right    Right femoral fracture ([place]) 09/29/2012  Right forearm fracture 09/29/2012   Right tibial fracture 02/05/2013   Thrombocytopenia (HCC)    Unsteady gait    uses walker   Wears hearing aid in both ears     Past Surgical History:  Procedure Laterality Date   APPENDECTOMY  2011 approx.    CARDIAC CATHETERIZATION  12-31-2011   dr j. Beatrix Fetters '@[facility]'$    normal LVF w/ multivessel CAD,  occluded RCA w/ colleterals   CATARACT EXTRACTION W/ INTRAOCULAR LENS  IMPLANT, BILATERAL  2016 approx.   COLONOSCOPY     CYSTOSCOPY WITH RETROGRADE PYELOGRAM, URETEROSCOPY AND STENT PLACEMENT Left 11/07/2017   Procedure: CYSTOSCOPY WITH RETROGRADE PYELOGRAM, URETEROSCOPY AND STENT PLACEMENT;  Surgeon: Cleon Gustin, MD;  Location[facility];  Service: Urology;  Laterality: Left;   CYSTOSCOPY WITH RETROGRADE PYELOGRAM, URETEROSCOPY AND STENT PLACEMENT Right 10/22/2019   Procedure: CYSTOSCOPY WITH RETROGRADE PYELOGRAM, URETEROSCOPY AND STENT PLACEMENT;  Surgeon: Cleon Gustin, MD;  Location: [facility];  Service: Urology;  Laterality: Right;   CYSTOSCOPY/URETEROSCOPY/HOLMIUM LASER/STENT PLACEMENT Right 03/11/2017   Procedure: CYSTOSCOPY/URETEROSCOPYSTENT PLACEMENT right ureter retrograde pylegram;  Surgeon: Cleon Gustin, MD;  [facility];  Service: Urology;  Laterality: Right;   HARDWARE REMOVAL  10/05/2012   Procedure: HARDWARE REMOVAL;  Surgeon: [name], MD;  [facility];  Service: Orthopedics;  Laterality: Right;  REMOVING  STRYKER  GAMMA NAIL   HARDWARE REMOVAL Right 07/03/2013   Procedure: HARDWARE REMOVAL RIGHT TIBIA ;  Surgeon: Rozanna Box, MD;  [facility];  Service: Orthopedics;  Laterality: Right;   HIP CLOSED REDUCTION Right 10/15/2013   Procedure: CLOSED MANIPULATION HIP;  Surgeon: [name], MD;  [facility];  Service: Orthopedics;  Laterality: Right;   HOLMIUM LASER APPLICATION Right 05/15/8849   Procedure: HOLMIUM LASER APPLICATION;  Surgeon: Cleon Gustin, MD;  [facility];  Service: Urology;  Laterality: Right;   HOLMIUM LASER APPLICATION Left 2/77/4128   Procedure: HOLMIUM LASER APPLICATION;  Surgeon: Cleon Gustin, MD;  [facility];  Service: Urology;  Laterality: Left;   HOLMIUM LASER APPLICATION Left 04/17/6766   Procedure: HOLMIUM LASER APPLICATION;  Surgeon: Cleon Gustin, MD;  [facility];  Service: Urology;  Laterality: Left;   HOLMIUM LASER APPLICATION Right 11/20/4707   Procedure: HOLMIUM LASER APPLICATION;  Surgeon: Cleon Gustin, MD;  Location: [facility];  Service: Urology;  Laterality: Right;   INCISION AND DRAINAGE HIP Right 11/16/2013   Procedure: IRRIGATION AND  DEBRIDEMENT RIGHT HIP;  Surgeon: [name], MD;  [facility];  Service: Orthopedics;  Laterality: Right;   INTRAVASCULAR PRESSURE WIRE/FFR STUDY N/A 08/13/2019   Procedure: INTRAVASCULAR PRESSURE WIRE/FFR STUDY;  Surgeon: Nelva Bush, MD;  Location: [place] [facility];  Service: Cardiovascular;  Laterality: N/A;   IR IMAGING GUIDED PORT INSERTION  06/22/2018   IR NEPHROSTOMY PLACEMENT LEFT  03/28/2019   IR URETERAL STENT LEFT NEW ACCESS W/O SEP NEPHROSTOMY CATH  10/24/2017   IR URETERAL STENT LEFT NEW ACCESS W/O SEP NEPHROSTOMY CATH  03/26/2019   NEPHROLITHOTOMY Right 02/08/2017   Procedure: NEPHROLITHOTOMY PERCUTANEOUS WITH SURGEON ACCESS;  Surgeon: Cleon Gustin, MD;  [facility];  Service: Urology;  Laterality: Right;   NEPHROLITHOTOMY Left 10/24/2017   Procedure: NEPHROLITHOTOMY PERCUTANEOUS;  Surgeon: Cleon Gustin, MD;  [facility];  Service: Urology;  Laterality: Left;   NEPHROLITHOTOMY Left 03/26/2019   Procedure: NEPHROLITHOTOMY PERCUTANEOUS;  Surgeon: Cleon Gustin, MD;  Location: [facility];  Service: Urology;  Laterality: Left;  2 HRS   NEPHROLITHOTOMY Left 04/17/2019   Procedure: NEPHROLITHOTOMY PERCUTANEOUS;  Surgeon: Cleon Gustin, MD;  [facility];  Service: Urology;  Laterality: Left;  2 HRS   ORIF TIBIA FRACTURE Right 02/06/2013   Procedure: OPEN REDUCTION INTERNAL FIXATION (ORIF) TIBIA FRACTURE WITH IM ROD FIBULA;  Surgeon: Rozanna Box, MD;  Location: [place];  Service: Orthopedics;  Laterality: Right;   ORIF TIBIA FRACTURE Right 07/03/2013   Procedure: RIGHT TIBIA NON UNION REPAIR ;  Surgeon: Rozanna Box, MD;  Location: [place];  Service: Orthopedics;  Laterality: Right;   ORIF WRIST FRACTURE  10/02/2012   Procedure: OPEN REDUCTION INTERNAL FIXATION (ORIF) WRIST FRACTURE;  Surgeon: Roseanne Kaufman, MD;  [facility];  Service: Orthopedics;  Laterality: Right;  WITH   ANTIBIOTIC  CEMENT   ORIF WRIST FRACTURE Left 10/28/2013    Procedure: OPEN REDUCTION INTERNAL FIXATION (ORIF) WRIST FRACTURE with allograft;  Surgeon: Roseanne Kaufman, MD;  [facility];  Service: Orthopedics;  Laterality: Left;  DVR Plate   QUADRICEPS TENDON REPAIR Left 07/15/2017   Procedure: REPAIR QUADRICEP TENDON;  Surgeon: Frederik Pear, MD;  Location: [place];  Service: Orthopedics;  Laterality: Left;   RIGHT/LEFT HEART CATH AND CORONARY ANGIOGRAPHY N/A 08/13/2019   Procedure: RIGHT/LEFT HEART CATH AND CORONARY ANGIOGRAPHY;  Surgeon: Nelva Bush, MD;  Location: [place] [facility];  Service: Cardiovascular;  Laterality: N/A;   THYROIDECTOMY  02/1986   TOTAL HIP ARTHROPLASTY Right 03-16-2016   '@[facility]'$    TOTAL HIP REVISION  10/05/2012   Procedure: TOTAL HIP REVISION;  Surgeon: [name], MD;  [facility];  Service: Orthopedics;  Laterality: Right;  RIGHT TOTAL HIP REVISION   TOTAL HIP REVISION Right 09/17/2013   Procedure: REVISION RIGHT TOTAL HIP ARTHROPLASTY ;  Surgeon: [name], MD;  [facility];  Service: Orthopedics;  Laterality: Right;   TOTAL HIP REVISION Right 10/26/2013   Procedure: REVISION RIGHT TOTAL HIP ARTHROPLASTY;  Surgeon: [name], MD;  [facility];  Service: Orthopedics;  Laterality: Right;   TOTAL KNEE ARTHROPLASTY Bilateral right 03-15-2011;  left 06-30-2011   TOTAL KNEE REVISION Left 04/11/2017   Procedure: TOTAL KNEE REVISION PATELLA and TIBIA;  Surgeon: Frederik Pear, MD;  Location: [place];  Service: Orthopedics;  Laterality: Left;    Social History:   reports that he quit smoking about 10 years ago. His smoking use included cigarettes. He has a 50.00 pack-year smoking history. He has never used smokeless tobacco. He reports that he does not currently use alcohol. He reports that he does not use drugs.  Allergies  Allergen Reactions   Short Ragweed Pollen Ext Cough    Family History  Problem Relation Age of Onset   CAD Father 9   Asthma Father    Alcohol abuse Father    Arthritis Mother     Alcohol abuse Sister      Prior to Admission medications   Medication Sig Start Date End Date Taking? Authorizing Provider  acetaminophen (TYLENOL) 325 MG tablet Take 2 tablets (650 mg total) by mouth every 6 (six) hours as needed for mild pain (or Fever >/= 101). 03/25/22   Raiford Noble Latif, DO  albuterol (VENTOLIN HFA) 108 (90 Base) MCG/ACT inhaler Inhale 2 puffs into the lungs every 6 (six) hours as needed for wheezing or shortness of breath.    [provider]  alfuzosin (UROXATRAL) 10 MG 24 hr tablet TAKE  1 TABLET AT BEDTIME Patient taking differently: 10 mg at bedtime. 03/19/21   McKenzie, Candee Furbish, MD  allopurinol (ZYLOPRIM) 100 MG tablet Take 1 tablet (100 mg total) by mouth 2 (two) times daily. 03/19/22   Brunetta Genera, MD  amoxicillin (AMOXIL) 500 MG capsule Take 2,000 mg by mouth daily as needed (dental appointments).    [provider]  aspirin EC 81 MG tablet Take 81 mg by mouth daily.    [provider]  atorvastatin (LIPITOR) 80 MG tablet Take 80 mg by mouth daily.    [provider]  buPROPion (WELLBUTRIN XL) 300 MG 24 hr tablet TAKE 1 TABLET EVERY DAY WITH BREAKFAST Patient taking differently: Take 300 mg by mouth daily. 02/20/21   Plotnikov, Evie Lacks, MD  Calcium Citrate-Vitamin D (CALCIUM CITRATE + D PO) Take 1 tablet by mouth 2 (two) times daily. '1200mg'$  of Calcium and 1000units vitamin D3    [provider]  Cholecalciferol (VITAMIN D-3 PO) Take 2,000 Units by mouth 2 (two) times daily.     [provider]  clonazePAM (KLONOPIN) 1 MG tablet Take 1 tablet (1 mg total) by mouth every 6 (six) hours as needed for anxiety. Take 1 tablet (1 mg total) by mouth every 6 (six) hours as needed. for anxiety Strength: 1 mg 03/26/22   Sheikh, Omair Latif, DO  CRANBERRY PO Take 2 tablets by mouth daily.    [provider]  denosumab (PROLIA) 60 MG/ML SOSY injection Inject 60 mg as directed every 6 (six) months. Last  injection: Feb 2020 Next injection: August 2020 12/10/15   [provider]  dexamethasone (DECADRON) 4 MG tablet Take 5 tablets (20 mg total) by mouth daily. Start the day after carboplatin chemotherapy for 3 days. 03/19/22   Brunetta Genera, MD  diltiazem (CARDIZEM CD) 240 MG 24 hr capsule TAKE 1 CAPSULE EVERY DAY Patient taking differently: Take 240 mg by mouth daily. 03/23/21   Plotnikov, Evie Lacks, MD  Docusate Sodium 100 MG capsule Take 100 mg by mouth 2 (two) times daily.    [provider]  DULoxetine (CYMBALTA) 60 MG capsule TAKE 1 CAPSULE TWICE DAILY Patient taking differently: Take 60 mg by mouth 2 (two) times daily. 03/23/21   Plotnikov, Evie Lacks, MD  EPINEPHrine 0.3 mg/0.3 mL IJ SOAJ injection Inject 0.3 mg into the muscle as needed for anaphylaxis. 07/31/21   [provider]  ezetimibe (ZETIA) 10 MG tablet TAKE 1 TABLET EVERY DAY Patient taking differently: Take 10 mg by mouth daily. 08/10/21   Skeet Latch, MD  feeding supplement (ENSURE ENLIVE / ENSURE PLUS) LIQD Take 237 mLs by mouth 2 (two) times daily between meals. 03/25/22   Raiford Noble Latif, DO  ferrous sulfate 325 (65 FE) MG tablet Take 325 mg by mouth 2 (two) times daily with a meal.     [provider]  hydrochlorothiazide (HYDRODIURIL) 12.5 MG tablet TAKE 1 TABLET (12.5 MG TOTAL) BY MOUTH DAILY. 06/23/21   Loel Dubonnet, NP  HYDROcodone-acetaminophen (NORCO) 7.5-325 MG tablet Take 1 tablet by mouth every 6 (six) hours as needed for severe pain. 03/26/22   Raiford Noble Latif, DO  isosorbide mononitrate (IMDUR) 30 MG 24 hr tablet TAKE 1 TABLET (30 MG TOTAL) BY MOUTH DAILY. 08/10/21   Skeet Latch, MD  levothyroxine (SYNTHROID) 100 MCG tablet Take 100 mcg by mouth daily before breakfast.    [provider]  lidocaine-prilocaine (EMLA) cream Apply 1 Application topically as needed. 04/21/22  Brunetta Genera, MD  loratadine (CLARITIN) 10 MG tablet Take 10 mg by  mouth daily.     [provider]  losartan (COZAAR) 50 MG tablet TAKE 1 TABLET EVERY DAY Patient taking differently: Take 50 mg by mouth daily. 06/18/21   Skeet Latch, MD  Magnesium 500 MG CAPS Take 1 capsule (500 mg total) by mouth daily. Patient taking differently: Take 500 mg by mouth every evening. 10/13/18   Brunetta Genera, MD  metoprolol tartrate (LOPRESSOR) 25 MG tablet Take 1 tablet (25 mg total) by mouth 2 (two) times daily. 08/18/21   Skeet Latch, MD  Multiple Vitamin (MULTIVITAMIN WITH MINERALS) TABS tablet Take 1 tablet by mouth daily. Men's One-A-Day 50+    [provider]  MYRBETRIQ 25 MG TB24 tablet TAKE 1 TABLET EVERY DAY Patient taking differently: Take 25 mg by mouth daily. 10/06/21   McKenzie, Candee Furbish, MD  nitroGLYCERIN (NITROSTAT) 0.4 MG SL tablet Place 0.4 mg under the tongue every 5 (five) minutes as needed for chest pain. x3 doses as needed for chest pain    [provider]  omeprazole (PRILOSEC) 40 MG capsule TAKE 1 [place] BREAKFAST Patient taking differently: Take 40 mg by mouth daily. TAKE 1 CAPSULE EVERY DAY BEFORE BREAKFAST 06/18/21   Plotnikov, Evie Lacks, MD  ondansetron (ZOFRAN) 8 MG tablet Take 1 tablet (8 mg total) by mouth 2 (two) times daily as needed. Start on the 3rd day after cisplatin chemotherapy. 03/19/22   Brunetta Genera, MD  Potassium Citrate 15 MEQ (1620 MG) TBCR TAKE 1 TABLET TWICE DAILY Patient taking differently: Take 1 tablet by mouth in the morning and at bedtime. 03/19/21   McKenzie, Candee Furbish, MD  Probiotic Product (PROBIOTIC PO) Take 1 capsule by mouth daily with breakfast.    [provider]  prochlorperazine (COMPAZINE) 10 MG tablet Take 1 tablet (10 mg total) by mouth every 6 (six) hours as needed (Nausea or vomiting). 03/19/22   Brunetta Genera, MD  rivaroxaban (XARELTO) 10 MG TABS tablet Take 1 tablet (10 mg total) by mouth daily. 06/29/21   Plotnikov, Evie Lacks, MD   solifenacin (VESICARE) 10 MG tablet Take 10 mg by mouth daily. 08/24/21   [provider]  testosterone enanthate (DELATESTRYL) 200 MG/ML injection INJECT 1ML ('200MG'$ ) INTO THE MUSCLE EVERY 14 DAYS. DISCARD VIAL AFTER 28 DAYS. Patient taking differently: 200 mg every 14 (fourteen) days. INJECT 1ML ('200MG'$ ) INTO THE MUSCLE EVERY 14 DAYS. DISCARD VIAL AFTER 28 DAYS. 09/22/21   Plotnikov, Evie Lacks, MD  vitamin C (ASCORBIC ACID) 500 MG tablet Take 500 mg by mouth daily.    [provider]    Physical Exam: Vitals:   04/23/22 0000 04/23/22 0100 04/23/22 0200 04/23/22 0300  BP: 111/65 (!) 111/58 106/61 (!) 119/55  Pulse: 85 72 73 76  Resp: '20 19 17 16  '$ Temp:      TempSrc:      SpO2: 98% 98% 95% 94%  Weight:      Height:        Constitutional: NAD, calm  Eyes: PERTLA, lids and conjunctivae normal ENMT: Mucous membranes are moist. Posterior pharynx clear of any exudate or lesions.   Neck: supple, no masses  Respiratory: no wheezing, no crackles. No accessory muscle use.  Cardiovascular: S1 & S2 heard, regular rate and rhythm. No extremity edema.  Abdomen: No distension, no tenderness, soft. Bowel sounds active.  Musculoskeletal: no clubbing / cyanosis. No joint deformity upper and  lower extremities.   Skin: no significant rashes, lesions, ulcers. Warm, dry, well-perfused. Neurologic: CN 2-12 grossly intact. Moving all extremities. Alert and oriented.  Psychiatric: Pleasant. Cooperative.    Labs and Imaging on Admission: I have personally reviewed following labs and imaging studies  CBC: Recent Labs  Lab 04/19/22 1309 04/22/22 1746  WBC 8.1 26.9*  NEUTROABS 7.0 26.1*  HGB 12.4* 10.2*  HCT 35.7* 29.0*  MCV 90.4 93.2  PLT 184 967*   Basic Metabolic Panel: Recent Labs  Lab 04/19/22 1309 04/22/22 1746  NA 132* 132*  K 4.0 4.7  CL 97* 103  CO2 30 21*  GLUCOSE 136* 159*  BUN 35* 44*  CREATININE 1.21 1.39*  CALCIUM 8.7* 8.2*   GFR: Estimated Creatinine  Clearance: 61.9 mL/min (A) (by C-G formula based on SCr of 1.39 mg/dL (H)). Liver Function Tests: Recent Labs  Lab 04/19/22 1309 04/22/22 1746  AST 29 29  ALT 78* 57*  ALKPHOS 65 60  BILITOT 1.5* 1.0  PROT 5.9* 5.5*  ALBUMIN 3.2* 2.7*   Recent Labs  Lab 04/22/22 1746  LIPASE 24   No results for input(s): "AMMONIA" in the last 168 hours. Coagulation Profile: No results for input(s): "INR", "PROTIME" in the last 168 hours. Cardiac Enzymes: No results for input(s): "CKTOTAL", "CKMB", "CKMBINDEX", "TROPONINI" in the last 168 hours. BNP (last 3 results) No results for input(s): "PROBNP" in the last 8760 hours. HbA1C: No results for input(s): "HGBA1C" in the last 72 hours. CBG: No results for input(s): "GLUCAP" in the last 168 hours. Lipid Profile: No results for input(s): "CHOL", "HDL", "LDLCALC", "TRIG", "CHOLHDL", "LDLDIRECT" in the last 72 hours. Thyroid Function Tests: No results for input(s): "TSH", "T4TOTAL", "FREET4", "T3FREE", "THYROIDAB" in the last 72 hours. Anemia Panel: No results for input(s): "VITAMINB12", "FOLATE", "FERRITIN", "TIBC", "IRON", "RETICCTPCT" in the last 72 hours. Urine analysis:    Component Value Date/Time   COLORURINE YELLOW 04/22/2022 2007   APPEARANCEUR HAZY (A) 04/22/2022 2007   LABSPEC 1.008 04/22/2022 2007   PHURINE 6.0 04/22/2022 2007   GLUCOSEU NEGATIVE 04/22/2022 2007   GLUCOSEU NEGATIVE 10/02/2018 1235   HGBUR SMALL (A) 04/22/2022 2007   BILIRUBINUR NEGATIVE 04/22/2022 2007   [place] NEGATIVE 04/22/2022 2007   PROTEINUR NEGATIVE 04/22/2022 2007   UROBILINOGEN 0.2 10/02/2018 1235   NITRITE NEGATIVE 04/22/2022 2007   LEUKOCYTESUR LARGE (A) 04/22/2022 2007   Sepsis Labs: '@LABRCNTIP'$ (procalcitonin:4,lacticidven:4) )No results found for this or any previous visit (from the past 240 hour(s)).   Radiological Exams on Admission: CT Angio Chest PE W and/or Wo Contrast  Result Date: 04/22/2022 CLINICAL DATA:  Concern for pulmonary  embolism. Diffuse large B-cell lymphoma. EXAM: CT ANGIOGRAPHY CHEST WITH CONTRAST TECHNIQUE: Multidetector CT imaging of the chest was performed using the standard protocol during bolus administration of intravenous contrast. Multiplanar CT image reconstructions and MIPs were obtained to evaluate the vascular anatomy. RADIATION DOSE REDUCTION: This exam was performed according to the departmental dose-optimization program which includes automated exposure control, adjustment of the mA and/or kV according to patient size and/or use of iterative reconstruction technique. CONTRAST:  163m OMNIPAQUE IOHEXOL 350 MG/ML SOLN COMPARISON:  Chest radiograph dated 04/22/2022 and CT dated 12/11/2016. PET CT dated 03/02/2022. FINDINGS: Evaluation of this exam is limited due to respiratory motion artifact. Cardiovascular: There is no cardiomegaly or pericardial effusion. There is coronary vascular calcification. Moderate atherosclerotic calcification of the thoracic aorta. No aneurysmal dilatation or dissection. Evaluation of the pulmonary arteries is limited due to respiratory motion artifact and suboptimal  visualization of the peripheral branches. No central pulmonary artery embolus identified. Right-sided Port-A-Cath with tip in the central SVC. Mediastinum/Nodes: Mildly enlarged lymph node to the left of the esophagus measures 11 mm short axis (27/4). No hilar or other mediastinal adenopathy. The esophagus is grossly unremarkable. No mediastinal fluid collection. Lungs/Pleura: There is a 2.7 x 2.5 cm right lower lobe nodule concerning for malignancy. No focal consolidation, pleural effusion, or pneumothorax. The central airways are patent. Upper Abdomen: Bilateral adrenal thickening. Musculoskeletal: Osteopenia with degenerative changes of the spine. Old healed right rib fractures. Fractures of the T5-T10 spinous process. Review of the MIP images confirms the above findings. IMPRESSION: 1. No CT evidence of central pulmonary  artery embolus. 2. Right lower lobe nodule concerning for malignancy. 3. Mildly enlarged left paraesophageal lymph node. 4. Fractures of the T5-T10 spinous process. 5. Aortic Atherosclerosis (ICD10-I70.0). Electronically Signed   By: Anner Crete M.D.   On: 04/22/2022 21:11   CT HEAD WO CONTRAST (5MM)  Result Date: 04/22/2022 CLINICAL DATA:  Dizziness EXAM: CT HEAD WITHOUT CONTRAST TECHNIQUE: Contiguous axial images were obtained from the base of the skull through the vertex without intravenous contrast. RADIATION DOSE REDUCTION: This exam was performed according to the departmental dose-optimization program which includes automated exposure control, adjustment of the mA and/or kV according to patient size and/or use of iterative reconstruction technique. COMPARISON:  03/22/2022 FINDINGS: Brain: There is atrophy and chronic small vessel disease changes. No acute intracranial abnormality. Specifically, no hemorrhage, hydrocephalus, mass lesion, acute infarction, or significant intracranial injury. Vascular: No hyperdense vessel or unexpected calcification. Skull: No acute calvarial abnormality. Sinuses/Orbits: No acute findings Other: None IMPRESSION: Atrophy, chronic microvascular disease. No acute intracranial abnormality. Electronically Signed   By: Rolm Baptise M.D.   On: 04/22/2022 17:16   DG Chest Portable 1 View  Result Date: 04/22/2022 CLINICAL DATA:  dizziness, hypotension EXAM: PORTABLE CHEST 1 VIEW COMPARISON:  Mar 06, 2022 FINDINGS: Cardiomediastinal silhouette is mildly prominent and stable. Thoracic aorta is ectatic. Again seen is the right-sided Port-A-Cath with its tip at the upper SVC region. There is no pulmonary consolidation, pleural effusion or vascular congestion. The visualized skeletal structures are unremarkable. IMPRESSION: No active disease. Electronically Signed   By: Frazier Richards M.D.   On: 04/22/2022 17:04    EKG: Independently reviewed. Atrial fibrillation.    Assessment/Plan   1. Sepsis d/t UTI  - Presents with fatigue and malaise similar to prior UTIs and found to have multiple SIRS criteria, elevated lactate, and fluid-responsive hypotension  - Culture urine, change antibiotic to cefepime given recent  Pseudomonal infection   2. CAD  - No anginal complaints  - Continue ASA and statin   3. Hypertension  - He was hypotensive initially and antihypertensive held on admission   4. Diffuse large B-cell lymphoma  - Started chemo with [facility] on 03/22/22 for recurrence    5. Depression, anxiety  - Continue Wellbutrin, Cymbalta, Klonopin   6. COPD, OSA  - Continue albuterol as needed, CPAP qHS    7. PAF  - Continue diltiazem as tolerated, continue Xarelto    DVT prophylaxis: Xarelto  Code Status: DNR  Level of Care: Level of care: Progressive Family Communication: none present  Disposition Plan:  Patient is from: Home   Anticipated d/c is to: TBD Anticipated d/c date is: 04/25/22  Patient currently: pending stable BP, improving infection, transition to oral antibiotic  Consults called: none  Admission status: Inpatient     Vianne Bulls, MD  [facility]  04/23/2022, 4:03 AM

## 2022-04-23 NOTE — Plan of Care (Signed)
  Problem: Clinical Measurements: Goal: Signs and symptoms of infection will decrease Outcome: Progressing   Problem: Respiratory: Goal: Ability to maintain adequate ventilation will improve Outcome: Progressing   Problem: Education: Goal: Knowledge of General Education information will improve Description: Including pain rating scale, medication(s)/side effects and non-pharmacologic comfort measures Outcome: Progressing   Problem: Health Behavior/Discharge Planning: Goal: Ability to manage health-related needs will improve Outcome: Progressing

## 2022-04-24 DIAGNOSIS — A419 Sepsis, unspecified organism: Secondary | ICD-10-CM | POA: Diagnosis not present

## 2022-04-24 DIAGNOSIS — N390 Urinary tract infection, site not specified: Secondary | ICD-10-CM | POA: Diagnosis not present

## 2022-04-24 LAB — CBC
HCT: 30.8 % — ABNORMAL LOW (ref 39.0–52.0)
Hemoglobin: 10.5 g/dL — ABNORMAL LOW (ref 13.0–17.0)
MCH: 32.3 pg (ref 26.0–34.0)
MCHC: 34.1 g/dL (ref 30.0–36.0)
MCV: 94.8 fL (ref 80.0–100.0)
Platelets: 90 10*3/uL — ABNORMAL LOW (ref 150–400)
RBC: 3.25 MIL/uL — ABNORMAL LOW (ref 4.22–5.81)
RDW: 16.4 % — ABNORMAL HIGH (ref 11.5–15.5)
WBC: 16.5 10*3/uL — ABNORMAL HIGH (ref 4.0–10.5)
nRBC: 0 % (ref 0.0–0.2)

## 2022-04-24 LAB — BASIC METABOLIC PANEL
Anion gap: 7 (ref 5–15)
BUN: 28 mg/dL — ABNORMAL HIGH (ref 8–23)
CO2: 27 mmol/L (ref 22–32)
Calcium: 8.4 mg/dL — ABNORMAL LOW (ref 8.9–10.3)
Chloride: 103 mmol/L (ref 98–111)
Creatinine, Ser: 1.01 mg/dL (ref 0.61–1.24)
GFR, Estimated: >60 mL/min (ref >60)
Glucose, Bld: 99 mg/dL (ref 70–99)
Potassium: 4.3 mmol/L (ref 3.5–5.1)
Sodium: 137 mmol/L (ref 135–145)

## 2022-04-24 LAB — URINE CULTURE: Culture: >=100000 — AB

## 2022-04-24 MED ORDER — DILTIAZEM HCL ER COATED BEADS 180 MG PO CP24
180.0000 mg | ORAL_CAPSULE | Freq: Every day | ORAL | Status: DC
  Administered 2022-04-25 – 2022-04-27 (×3): 180 mg via ORAL
  Filled 2022-04-24 (×3): qty 1

## 2022-04-24 MED ORDER — ENSURE ENLIVE PO LIQD
237.0000 mL | Freq: Two times a day (BID) | ORAL | Status: DC
  Administered 2022-04-24: 237 mL via ORAL

## 2022-04-24 MED ORDER — SODIUM CHLORIDE 0.9 % IV SOLN: INTRAVENOUS | Status: DC | PRN

## 2022-04-24 MED ORDER — LACTATED RINGERS IV SOLN
INTRAVENOUS | Status: AC
Start: 2022-04-24 — End: 2022-04-26

## 2022-04-24 MED ORDER — SODIUM CHLORIDE 0.9 % IV SOLN
1.0000 g | Freq: Three times a day (TID) | INTRAVENOUS | Status: DC
  Administered 2022-04-24 – 2022-04-27 (×9): 1 g via INTRAVENOUS
  Filled 2022-04-24 (×11): qty 20

## 2022-04-24 MED ORDER — LACTATED RINGERS IV BOLUS
500.0000 mL | Freq: Once | INTRAVENOUS | Status: AC
  Administered 2022-04-24: 500 mL via INTRAVENOUS

## 2022-04-24 NOTE — Progress Notes (Addendum)
   04/24/22 1336  Orthostatic Sitting  BP- Sitting 118/57  Pulse- Sitting 93  Orthostatic Standing at 0 minutes  BP- Standing at 0 minutes (!) 87/51  Pulse- Standing at 0 minutes 113   Patient stood for orthostatic vital signs and within 30 seconds of standing, he began to feel dizzy and he fell back into the chair. BP was taken from the moment he stood. By the time the automatic BP was finished reading, he had fallen back into the chair and his BP resulted to 87/51. MD made aware. See new orders.   Layla Maw, RN

## 2022-04-24 NOTE — Progress Notes (Signed)
PROGRESS NOTE    [name]  NAT:[id number] DOB: 18-Apr-1946 DOA: 04/22/2022 PCP: Cassandria Anger, MD   Brief Narrative: 76 year old with past medical history significant for hypertension, depression, anxiety, CAD, recurrent diffuse large B-cell lymphoma presents to the ED complaining of fatigue, generalized malaise similar to when he has UTI in the past.  He reports 4 days of fatigue malaise and lightheadedness.  He was found to be hypotensive when EMS arrive.  He received IV fluids.  Evaluation in the ED he was found to have leukocytosis 26,000, lactic acidosis at 4.6.  He received IV fluids and IV antibiotics.  Patient admitted with sepsis secondary to urinary tract infection E. coli ESBL.   Assessment & Plan:   Principal Problem:   Sepsis secondary to UTI [facility]) Active Problems:   Hypothyroidism   Essential hypertension   Chronic obstructive pulmonary disease (HCC)   Diffuse large B-cell lymphoma (HCC)   Paroxysmal atrial fibrillation (HCC)   Depression   CAD (coronary artery disease)   OSA (obstructive sleep apnea)   Generalized anxiety disorder   1-Sepsis secondary to urinary tract infection: Patient presented with malaise fatigue, UA with more than 50 white blood cell, leukocytosis hypotension and lactic acidosis -He received Give IV fluids -Continue with IV fluids maintenance -Continue with IV cefepime -Follow urine blood cultures  2-CAD: Denies chest pain.  Continue with aspirin and statins  3-Near syncope-Orthostatic Hypotension. Patient develops near syncope episode while trying to walk in the room with RN. Orthostatic vitals positive.  -Plan to give IV bolus.  -Continue with IV fluids.  -Check Cortisol level in am.  -Reduce Cardizem dose.   4-Diffuse large B-cell lymphoma: - Started chemo with [facility] on 03/22/22 for recurrence  -Needs to follow up with Dr Irene Limbo.      5-Depression, anxiety: Continue with Wellbutrin, Cymbalta: Klonopin  6-COPD, OSA:  Continue with albuterol, CPAP nightly  PAF: Continue with Cardizem as blood pressure allows and continue, with  Xarelto. Will reduce cardizem due to orthostatic hypotension.   Anemia of malignancy. Monitor hb.  Thrombocytopenia. Suspect related to acute infectious process.  Hypertension: Continue to hold blood pressure medications.  He was initially hypotensive. Estimated body mass index is 30.84 kg/m as calculated from the following:   Height as of this encounter: 6' 3.5" (1.918 m).   Weight as of this encounter: 113.4 kg.   DVT prophylaxis: Xarelto Code Status: DNR Family Communication: Care discussed with patient Disposition Plan:  Status is: Inpatient Remains inpatient appropriate because: needs IV fluid and IV antibiotics.     Consultants:  Dr Irene Limbo added to treatment team.   Procedures:  none  Antimicrobials:  Cefepime   Subjective: He seem improved. Sitting recliner.  Had near syncope episode today.   Objective: Vitals:   04/24/22 0125 04/24/22 0401 04/24/22 1122 04/24/22 1310  BP: (!) 114/58 (!) 113/54 132/71 (!) 116/55  Pulse: 81 79 94 89  Resp: '19 19  16  '$ Temp: 97.7 F (36.5 C) 98.2 F (36.8 C)  97.8 F (36.6 C)  TempSrc: Oral   Oral  SpO2: 96% 95% 98% 100%  Weight:      Height:        Intake/Output Summary (Last 24 hours) at 04/24/2022 1405 Last data filed at 04/24/2022 1346 Gross per 24 hour  Intake 2895.46 ml  Output 2800 ml  Net 95.46 ml    Filed Weights   04/22/22 1637  Weight: 113.4 kg    Examination:  General exam: NAD Respiratory  system: CTA Cardiovascular system: S 1, S 2 RRR Gastrointestinal system: BS present, soft, nt Central nervous system: Alert, and oriented.  Extremities: no edema   Data Reviewed: I have personally reviewed following labs and imaging studies  CBC: Recent Labs  Lab 04/19/22 1309 04/22/22 1746 04/23/22 0418 04/24/22 0416  WBC 8.1 26.9* 24.0* 16.5*  NEUTROABS 7.0 26.1*  --   --   HGB 12.4* 10.2*  10.2* 10.5*  HCT 35.7* 29.0* 29.4* 30.8*  MCV 90.4 93.2 93.6 94.8  PLT 184 126* 102* 90*    Basic Metabolic Panel: Recent Labs  Lab 04/19/22 1309 04/22/22 1746 04/23/22 0418 04/24/22 0416  NA 132* 132* 138 137  K 4.0 4.7 4.6 4.3  CL 97* 103 105 103  CO2 30 21* 24 27  GLUCOSE 136* 159* 127* 99  BUN 35* 44* 34* 28*  CREATININE 1.21 1.39* 1.03 1.01  CALCIUM 8.7* 8.2* 8.3* 8.4*    GFR: Estimated Creatinine Clearance: 85.2 mL/min (by C-G formula based on SCr of 1.01 mg/dL). Liver Function Tests: Recent Labs  Lab 04/19/22 1309 04/22/22 1746 04/23/22 0418  AST '29 29 25  '$ ALT 78* 57* 54*  ALKPHOS 65 60 59  BILITOT 1.5* 1.0 1.0  PROT 5.9* 5.5* 5.4*  ALBUMIN 3.2* 2.7* 2.8*    Recent Labs  Lab 04/22/22 1746  LIPASE 24    No results for input(s): "AMMONIA" in the last 168 hours. Coagulation Profile: No results for input(s): "INR", "PROTIME" in the last 168 hours. Cardiac Enzymes: No results for input(s): "CKTOTAL", "CKMB", "CKMBINDEX", "TROPONINI" in the last 168 hours. BNP (last 3 results) No results for input(s): "PROBNP" in the last 8760 hours. HbA1C: No results for input(s): "HGBA1C" in the last 72 hours. CBG: No results for input(s): "GLUCAP" in the last 168 hours. Lipid Profile: No results for input(s): "CHOL", "HDL", "LDLCALC", "TRIG", "CHOLHDL", "LDLDIRECT" in the last 72 hours. Thyroid Function Tests: No results for input(s): "TSH", "T4TOTAL", "FREET4", "T3FREE", "THYROIDAB" in the last 72 hours. Anemia Panel: No results for input(s): "VITAMINB12", "FOLATE", "FERRITIN", "TIBC", "IRON", "RETICCTPCT" in the last 72 hours. Sepsis Labs: Recent Labs  Lab 04/22/22 1746  LATICACIDVEN 2.6*     Recent Results (from the past 240 hour(s))  Blood culture (routine x 2)     Status: None (Preliminary result)   Collection Time: 04/22/22  5:46 PM   Specimen: BLOOD  Result Value Ref Range Status   Specimen Description   Final    BLOOD BLOOD RIGHT  FOREARM Performed at [facility] [address], [place], [place] [address]    Special Requests   Final    BOTTLES DRAWN AEROBIC AND ANAEROBIC Blood Culture results may not be optimal due to an excessive volume of blood received in culture bottles Performed at [facility] [address], [place], [place] [address]    Culture   Final    NO GROWTH 2 DAYS Performed at [facility] [address] [place], [place] [address]    Report Status PENDING  Incomplete  Blood culture (routine x 2)     Status: None (Preliminary result)   Collection Time: 04/22/22  5:58 PM   Specimen: BLOOD  Result Value Ref Range Status   Specimen Description   Final    BLOOD BLOOD RIGHT WRIST Performed at [facility] [address], [place], [place] [address]    Special Requests   Final    BOTTLES DRAWN AEROBIC AND ANAEROBIC Blood  Culture adequate volume Performed at [facility] [address] [place], [place] [address]    Culture   Final    NO GROWTH 2 DAYS Performed at [facility] [address] [place], [place] [address]    Report Status PENDING  Incomplete  Urine Culture     Status: Abnormal   Collection Time: 04/22/22  8:07 PM   Specimen: Urine, Clean Catch  Result Value Ref Range Status   Specimen Description   Final    URINE, CLEAN CATCH Performed at [facility], [place] [address] [place], [place] [address]    Special Requests   Final    NONE Performed at [facility], [place] [address], [place], [place] [address]    Culture (A)  Final    >=100,000 COLONIES/mL ESCHERICHIA COLI Confirmed Extended Spectrum Beta-Lactamase Producer (ESBL).  In bloodstream infections from ESBL organisms, carbapenems are preferred over piperacillin/tazobactam. They are shown to have a lower risk of mortality.    Report Status 04/24/2022 FINAL  Final   Organism ID, Bacteria  ESCHERICHIA COLI (A)  Final      Susceptibility   Escherichia coli - MIC*    AMPICILLIN >=32 RESISTANT Resistant     CEFAZOLIN >=64 RESISTANT Resistant     CEFEPIME 2 SENSITIVE Sensitive     CEFTRIAXONE 32 RESISTANT Resistant     CIPROFLOXACIN >=4 RESISTANT Resistant     GENTAMICIN >=16 RESISTANT Resistant     IMIPENEM <=0.25 SENSITIVE Sensitive     NITROFURANTOIN <=16 SENSITIVE Sensitive     TRIMETH/SULFA >=320 RESISTANT Resistant     AMPICILLIN/SULBACTAM 16 INTERMEDIATE Intermediate     PIP/TAZO <=4 SENSITIVE Sensitive     * >=100,000 COLONIES/mL ESCHERICHIA COLI         Radiology Studies: CT Angio Chest PE W and/or Wo Contrast  Result Date: 04/22/2022 CLINICAL DATA:  Concern for pulmonary embolism. Diffuse large B-cell lymphoma. EXAM: CT ANGIOGRAPHY CHEST WITH CONTRAST TECHNIQUE: Multidetector CT imaging of the chest was performed using the standard protocol during bolus administration of intravenous contrast. Multiplanar CT image reconstructions and MIPs were obtained to evaluate the vascular anatomy. RADIATION DOSE REDUCTION: This exam was performed according to the departmental dose-optimization program which includes automated exposure control, adjustment of the mA and/or kV according to patient size and/or use of iterative reconstruction technique. CONTRAST:  157m OMNIPAQUE IOHEXOL 350 MG/ML SOLN COMPARISON:  Chest radiograph dated 04/22/2022 and CT dated 12/11/2016. PET CT dated 03/02/2022. FINDINGS: Evaluation of this exam is limited due to respiratory motion artifact. Cardiovascular: There is no cardiomegaly or pericardial effusion. There is coronary vascular calcification. Moderate atherosclerotic calcification of the thoracic aorta. No aneurysmal dilatation or dissection. Evaluation of the pulmonary arteries is limited due to respiratory motion artifact and suboptimal visualization of the peripheral branches. No central pulmonary artery embolus identified. Right-sided  Port-A-Cath with tip in the central SVC. Mediastinum/Nodes: Mildly enlarged lymph node to the left of the esophagus measures 11 mm short axis (27/4). No hilar or other mediastinal adenopathy. The esophagus is grossly unremarkable. No mediastinal fluid collection. Lungs/Pleura: There is a 2.7 x 2.5 cm right lower lobe nodule concerning for malignancy. No focal consolidation, pleural effusion, or pneumothorax. The central airways are patent. Upper Abdomen: Bilateral adrenal thickening. Musculoskeletal: Osteopenia with degenerative changes of the spine. Old healed right rib fractures. Fractures of the T5-T10 spinous process. Review of the MIP images confirms the above findings. IMPRESSION: 1. No CT evidence of central pulmonary artery  embolus. 2. Right lower lobe nodule concerning for malignancy. 3. Mildly enlarged left paraesophageal lymph node. 4. Fractures of the T5-T10 spinous process. 5. Aortic Atherosclerosis (ICD10-I70.0). Electronically Signed   By: Anner Crete M.D.   On: 04/22/2022 21:11   CT HEAD WO CONTRAST (5MM)  Result Date: 04/22/2022 CLINICAL DATA:  Dizziness EXAM: CT HEAD WITHOUT CONTRAST TECHNIQUE: Contiguous axial images were obtained from the base of the skull through the vertex without intravenous contrast. RADIATION DOSE REDUCTION: This exam was performed according to the departmental dose-optimization program which includes automated exposure control, adjustment of the mA and/or kV according to patient size and/or use of iterative reconstruction technique. COMPARISON:  03/22/2022 FINDINGS: Brain: There is atrophy and chronic small vessel disease changes. No acute intracranial abnormality. Specifically, no hemorrhage, hydrocephalus, mass lesion, acute infarction, or significant intracranial injury. Vascular: No hyperdense vessel or unexpected calcification. Skull: No acute calvarial abnormality. Sinuses/Orbits: No acute findings Other: None IMPRESSION: Atrophy, chronic microvascular  disease. No acute intracranial abnormality. Electronically Signed   By: Rolm Baptise M.D.   On: 04/22/2022 17:16   DG Chest Portable 1 View  Result Date: 04/22/2022 CLINICAL DATA:  dizziness, hypotension EXAM: PORTABLE CHEST 1 VIEW COMPARISON:  Mar 06, 2022 FINDINGS: Cardiomediastinal silhouette is mildly prominent and stable. Thoracic aorta is ectatic. Again seen is the right-sided Port-A-Cath with its tip at the upper SVC region. There is no pulmonary consolidation, pleural effusion or vascular congestion. The visualized skeletal structures are unremarkable. IMPRESSION: No active disease. Electronically Signed   By: Frazier Richards M.D.   On: 04/22/2022 17:04        Scheduled Meds:  alfuzosin  10 mg Oral QHS   aspirin EC  81 mg Oral Daily   atorvastatin  80 mg Oral Daily   buPROPion  300 mg Oral Daily   darifenacin  15 mg Oral Daily   diltiazem  240 mg Oral Daily   DULoxetine  60 mg Oral BID   feeding supplement  237 mL Oral BID BM   levothyroxine  100 mcg Oral Q0600   mirabegron ER  25 mg Oral Daily   pantoprazole  40 mg Oral Daily   rivaroxaban  10 mg Oral Daily   sodium chloride flush  3 mL Intravenous Q12H   Continuous Infusions:  lactated ringers     lactated ringers     meropenem (MERREM) IV 1 g (04/24/22 1326)     LOS: 2 days    Time spent: 35 minutes    Nephtali Docken A Roosevelt Eimers, MD [facility]   If 7PM-7AM, please contact night-coverage [email or web address]  04/24/2022, 2:05 PM

## 2022-04-24 NOTE — Progress Notes (Signed)
Pt has declined CPAP QHS for tonight. Pt states that he has to have humidity with CPAP or can't use it.  Pt to possibly get someone to bring CPAP from home for tomorrow night.

## 2022-04-24 NOTE — Progress Notes (Signed)
PT Cancellation Note  Patient Details Name: [name] MRN: [id number] DOB: 08-30-1946   Cancelled Treatment:    Reason Eval/Treat Not Completed: Medical issues which prohibited therapy Pt just orthostatic with RN and RN about to give bolus.  Will check back as schedule permits.   Myrtis Hopping [place] 04/24/2022, 2:25 PM Arlyce Dice, DPT Physical Therapist [facility] Preferred contact method: Secure Chat Weekend Pager Only: [phone] Office: [phone]

## 2022-04-25 DIAGNOSIS — A419 Sepsis, unspecified organism: Secondary | ICD-10-CM | POA: Diagnosis not present

## 2022-04-25 DIAGNOSIS — N390 Urinary tract infection, site not specified: Secondary | ICD-10-CM | POA: Diagnosis not present

## 2022-04-25 LAB — CBC
HCT: 30.9 % — ABNORMAL LOW (ref 39.0–52.0)
Hemoglobin: 10.6 g/dL — ABNORMAL LOW (ref 13.0–17.0)
MCH: 32.4 pg (ref 26.0–34.0)
MCHC: 34.3 g/dL (ref 30.0–36.0)
MCV: 94.5 fL (ref 80.0–100.0)
Platelets: 77 10*3/uL — ABNORMAL LOW (ref 150–400)
RBC: 3.27 MIL/uL — ABNORMAL LOW (ref 4.22–5.81)
RDW: 16.3 % — ABNORMAL HIGH (ref 11.5–15.5)
WBC: 12.8 10*3/uL — ABNORMAL HIGH (ref 4.0–10.5)
nRBC: 0.4 % — ABNORMAL HIGH (ref 0.0–0.2)

## 2022-04-25 LAB — BASIC METABOLIC PANEL
Anion gap: 6 (ref 5–15)
BUN: 27 mg/dL — ABNORMAL HIGH (ref 8–23)
CO2: 27 mmol/L (ref 22–32)
Calcium: 8.5 mg/dL — ABNORMAL LOW (ref 8.9–10.3)
Chloride: 100 mmol/L (ref 98–111)
Creatinine, Ser: 1.15 mg/dL (ref 0.61–1.24)
GFR, Estimated: >60 mL/min (ref >60)
Glucose, Bld: 136 mg/dL — ABNORMAL HIGH (ref 70–99)
Potassium: 3.8 mmol/L (ref 3.5–5.1)
Sodium: 133 mmol/L — ABNORMAL LOW (ref 135–145)

## 2022-04-25 LAB — CORTISOL: Cortisol, Plasma: 7.2 ug/dL

## 2022-04-25 MED ORDER — POLYETHYLENE GLYCOL 3350 17 G PO PACK
17.0000 g | PACK | Freq: Every day | ORAL | Status: DC
Start: 2022-04-25 — End: 2022-04-27
  Administered 2022-04-25 – 2022-04-26 (×2): 17 g via ORAL
  Filled 2022-04-25 (×3): qty 1

## 2022-04-25 MED ORDER — ENSURE ENLIVE PO LIQD
237.0000 mL | Freq: Three times a day (TID) | ORAL | Status: DC
  Administered 2022-04-25 – 2022-04-27 (×6): 237 mL via ORAL

## 2022-04-25 MED ORDER — ADULT MULTIVITAMIN W/MINERALS CH
1.0000 | ORAL_TABLET | Freq: Every day | ORAL | Status: DC
  Administered 2022-04-26 – 2022-04-27 (×2): 1 via ORAL
  Filled 2022-04-25 (×2): qty 1

## 2022-04-25 NOTE — Progress Notes (Signed)
Initial Nutrition Assessment  DOCUMENTATION CODES:   Not applicable  INTERVENTION:   Ensure Enlive po TID, each supplement provides 350 kcal and 20 grams of protein.  MVI po daily   Pt at high refeed risk; recommend monitor potassium, magnesium and phosphorus labs daily until stable  NUTRITION DIAGNOSIS:   Unintentional weight loss related to cancer and cancer related treatments as evidenced by 20 percent weight loss in < 1 year.  GOAL:   Patient will meet greater than or equal to 90% of their needs  MONITOR:   PO intake, Supplement acceptance, Labs, Weight trends, Skin, I & O's  REASON FOR ASSESSMENT:   Malnutrition Screening Tool    ASSESSMENT:   76 y/o male with h/o B-cell lymphoma on chemotherapy, CHF, PAF, DJD, CAD, COPD, GERD, anxiety, [place] [phone]) s/p thyroidecomy, HLD, HTN and OSA who is admitted with UTI and sepsis.  RD working remotely.  Called pt's room; RN answered and reports pt unavailable to come to the phone. Per chart review, pt reports poor oral intake for the past several days. Pt is documented to have eaten 100% of his dinner last night. Pt is ordered for strawberry Ensure. Pt is followed by the Dietitian at the [facility]. Pt reports his UBW is ~310lbs. Per chart, pt is down 60lbs(20%) in less than one year; this is significant weight loss. Recommend continue Ensure supplements. RD will add MVI daily. Pt is likely at high feed risk. RD will obtain nutrition related history and exam at follow-up.   Medications reviewed and include: aspirin, synthroid, protonix, LRS '@100ml'$ /hr, meropenem   Labs reviewed: Na 133(L), BUN 27(H) Wbc- 12.8(H), Hgb 10.6(L), Hct 30.9(L)  NUTRITION - FOCUSED PHYSICAL EXAM: Unable to perform a this time   Diet Order:   Diet Order             Diet regular Room service appropriate? Yes; Fluid consistency: Thin  Diet effective now                  EDUCATION NEEDS:   Not appropriate for education at this  time  Skin:  Skin Assessment: Reviewed RN Assessment (ecchymosis)  Last BM:  7/16  Height:   Ht Readings from Last 1 Encounters:  04/22/22 6' 3.5" (1.918 m)    Weight:   Wt Readings from Last 1 Encounters:  04/22/22 113.4 kg    Ideal Body Weight:  90.45 kg  BMI:  Body mass index is 30.84 kg/m.  Estimated Nutritional Needs:   Kcal:  2400-2700kcal/day  Protein:  >120g/day  Fluid:  2.4-2.7L/day  Koleen Distance MS, RD, LDN Please refer to [facility] for RD and/or RD on-call/weekend/after hours pager

## 2022-04-25 NOTE — Progress Notes (Signed)
PROGRESS NOTE    [name]  UDJ:[id number] DOB: 05-27-1946 DOA: 04/22/2022 PCP: Cassandria Anger, MD   Brief Narrative: 76 year old with past medical history significant for hypertension, depression, anxiety, CAD, recurrent diffuse large B-cell lymphoma presents to the ED complaining of fatigue, generalized malaise similar to when he has UTI in the past.  He reports 4 days of fatigue malaise and lightheadedness.  He was found to be hypotensive when EMS arrive.  He received IV fluids.  Evaluation in the ED he was found to have leukocytosis 26,000, lactic acidosis at 4.6.  He received IV fluids and IV antibiotics.  Patient admitted with sepsis secondary to urinary tract infection E. coli ESBL.   Assessment & Plan:   Principal Problem:   Sepsis secondary to UTI [facility]) Active Problems:   Hypothyroidism   Essential hypertension   Chronic obstructive pulmonary disease (HCC)   Diffuse large B-cell lymphoma (HCC)   Paroxysmal atrial fibrillation (HCC)   Depression   CAD (coronary artery disease)   OSA (obstructive sleep apnea)   Generalized anxiety disorder   1-Sepsis secondary to urinary tract infection: Patient presented with malaise fatigue, UA with more than 50 white blood cell, leukocytosis hypotension and lactic acidosis -He received Give IV fluids -Continue with IV fluids maintenance -Urine culture grew E coli ESBL. IV antibiotics change to Meropenem 6/15. -Blood culture; no growth to date  2-CAD: Denies chest pain.  Continue with aspirin and statins  3-Near syncope-Orthostatic Hypotension. Patient develops near syncope episode while trying to walk in the room with RN 6/15. Orthostatic vitals positive.  -Continue with IV fluids.  -Cortisol level 7 -Reduce Cardizem dose.  -less symptomatic today. Continue with IV fluids. Ted hose.   4-Diffuse large B-cell lymphoma: - Started chemo with [facility] on 03/22/22 for recurrence  -Needs to follow up with Dr Irene Limbo.       5-Depression, anxiety: Continue with Wellbutrin, Cymbalta: Klonopin  6-COPD, OSA: Continue with albuterol, CPAP nightly  PAF: Continue with Cardizem as blood pressure allows and continue, with  Xarelto. Will reduce cardizem due to orthostatic hypotension.   Anemia of malignancy. Monitor hb. Hb stable.  Thrombocytopenia. Suspect related to acute infectious process.  Hypertension: Continue to hold blood pressure medications.  He was initially hypotensive. Estimated body mass index is 30.84 kg/m as calculated from the following:   Height as of this encounter: 6' 3.5" (1.918 m).   Weight as of this encounter: 113.4 kg.   DVT prophylaxis: Xarelto Code Status: DNR Family Communication: Care discussed with patient Disposition Plan:  Status is: Inpatient Remains inpatient appropriate because: needs IV fluid and IV antibiotics.     Consultants:  Dr Irene Limbo added to treatment team.   Procedures:  none  Antimicrobials:  Cefepime   Subjective: He is feeling better. He was still orthostatic today, but  asymptomatic.   Objective: Vitals:   04/24/22 1607 04/24/22 1959 04/25/22 0522 04/25/22 1210  BP: (!) 117/53 (!) 110/50 (!) 116/54 99/67  Pulse:  99 94 83  Resp:  '19 19 20  '$ Temp:  98.5 F (36.9 C) 98.4 F (36.9 C) 98.8 F (37.1 C)  TempSrc:    Oral  SpO2:  98% 96% 99%  Weight:      Height:        Intake/Output Summary (Last 24 hours) at 04/25/2022 1435 Last data filed at 04/25/2022 0830 Gross per 24 hour  Intake 3166.23 ml  Output 1400 ml  Net 1766.23 ml    [employer]  04/22/22 1637  Weight: 113.4 kg    Examination:  General exam: NAD Respiratory system: CTA Cardiovascular system: S 1, S 2 RRR Gastrointestinal system: BS present, soft, nt Central nervous system: alert, conversant.  Extremities: no edema   Data Reviewed: I have personally reviewed following labs and imaging studies  CBC: Recent Labs  Lab 04/19/22 1309 04/22/22 1746 04/23/22 0418  04/24/22 0416 04/25/22 0351  WBC 8.1 26.9* 24.0* 16.5* 12.8*  NEUTROABS 7.0 26.1*  --   --   --   HGB 12.4* 10.2* 10.2* 10.5* 10.6*  HCT 35.7* 29.0* 29.4* 30.8* 30.9*  MCV 90.4 93.2 93.6 94.8 94.5  PLT 184 126* 102* 90* 77*    Basic Metabolic Panel: Recent Labs  Lab 04/19/22 1309 04/22/22 1746 04/23/22 0418 04/24/22 0416 04/25/22 0351  NA 132* 132* 138 137 133*  K 4.0 4.7 4.6 4.3 3.8  CL 97* 103 105 103 100  CO2 30 21* '24 27 27  '$ GLUCOSE 136* 159* 127* 99 136*  BUN 35* 44* 34* 28* 27*  CREATININE 1.21 1.39* 1.03 1.01 1.15  CALCIUM 8.7* 8.2* 8.3* 8.4* 8.5*    GFR: Estimated Creatinine Clearance: 74.8 mL/min (by C-G formula based on SCr of 1.15 mg/dL). Liver Function Tests: Recent Labs  Lab 04/19/22 1309 04/22/22 1746 04/23/22 0418  AST '29 29 25  '$ ALT 78* 57* 54*  ALKPHOS 65 60 59  BILITOT 1.5* 1.0 1.0  PROT 5.9* 5.5* 5.4*  ALBUMIN 3.2* 2.7* 2.8*    Recent Labs  Lab 04/22/22 1746  LIPASE 24    No results for input(s): "AMMONIA" in the last 168 hours. Coagulation Profile: No results for input(s): "INR", "PROTIME" in the last 168 hours. Cardiac Enzymes: No results for input(s): "CKTOTAL", "CKMB", "CKMBINDEX", "TROPONINI" in the last 168 hours. BNP (last 3 results) No results for input(s): "PROBNP" in the last 8760 hours. HbA1C: No results for input(s): "HGBA1C" in the last 72 hours. CBG: No results for input(s): "GLUCAP" in the last 168 hours. Lipid Profile: No results for input(s): "CHOL", "HDL", "LDLCALC", "TRIG", "CHOLHDL", "LDLDIRECT" in the last 72 hours. Thyroid Function Tests: No results for input(s): "TSH", "T4TOTAL", "FREET4", "T3FREE", "THYROIDAB" in the last 72 hours. Anemia Panel: No results for input(s): "VITAMINB12", "FOLATE", "FERRITIN", "TIBC", "IRON", "RETICCTPCT" in the last 72 hours. Sepsis Labs: Recent Labs  Lab 04/22/22 1746  LATICACIDVEN 2.6*     Recent Results (from the past 240 hour(s))  Blood culture (routine x 2)      Status: None (Preliminary result)   Collection Time: 04/22/22  5:46 PM   Specimen: BLOOD  Result Value Ref Range Status   Specimen Description   Final    BLOOD BLOOD RIGHT FOREARM Performed at [facility] [address], [place], [place] [address]    Special Requests   Final    BOTTLES DRAWN AEROBIC AND ANAEROBIC Blood Culture results may not be optimal due to an excessive volume of blood received in culture bottles Performed at [facility] [address] [place], [place] [address]    Culture   Final    NO GROWTH 3 DAYS Performed at [facility], [place] [address] [place], [place] [address]    Report Status PENDING  Incomplete  Blood culture (routine x 2)     Status: None (Preliminary result)   Collection Time: 04/22/22  5:58 PM   Specimen: BLOOD  Result Value Ref Range Status   Specimen Description   Final  BLOOD BLOOD RIGHT WRIST Performed at [facility] [address], [place], [place] [address]    Special Requests   Final    BOTTLES DRAWN AEROBIC AND ANAEROBIC Blood Culture adequate volume Performed at [facility] [address], [place], [place] [address]    Culture   Final    NO GROWTH 3 DAYS Performed at [facility], [place] [address], [place], [place] [address]    Report Status PENDING  Incomplete  Urine Culture     Status: Abnormal   Collection Time: 04/22/22  8:07 PM   Specimen: Urine, Clean Catch  Result Value Ref Range Status   Specimen Description   Final    URINE, CLEAN CATCH Performed at [facility], [place] [address], [place], [place] [address]    Special Requests   Final    NONE Performed at [facility], [place] [address] [place], [place] [address]    Culture (A)  Final    >=100,000 COLONIES/mL ESCHERICHIA COLI Confirmed Extended Spectrum Beta-Lactamase Producer (ESBL).  In bloodstream infections from ESBL organisms,  carbapenems are preferred over piperacillin/tazobactam. They are shown to have a lower risk of mortality.    Report Status 04/24/2022 FINAL  Final   Organism ID, Bacteria ESCHERICHIA COLI (A)  Final      Susceptibility   Escherichia coli - MIC*    AMPICILLIN >=32 RESISTANT Resistant     CEFAZOLIN >=64 RESISTANT Resistant     CEFEPIME 2 SENSITIVE Sensitive     CEFTRIAXONE 32 RESISTANT Resistant     CIPROFLOXACIN >=4 RESISTANT Resistant     GENTAMICIN >=16 RESISTANT Resistant     IMIPENEM <=0.25 SENSITIVE Sensitive     NITROFURANTOIN <=16 SENSITIVE Sensitive     TRIMETH/SULFA >=320 RESISTANT Resistant     AMPICILLIN/SULBACTAM 16 INTERMEDIATE Intermediate     PIP/TAZO <=4 SENSITIVE Sensitive     * >=100,000 COLONIES/mL ESCHERICHIA COLI         Radiology Studies: No results found.      Scheduled Meds:  alfuzosin  10 mg Oral QHS   aspirin EC  81 mg Oral Daily   atorvastatin  80 mg Oral Daily   buPROPion  300 mg Oral Daily   darifenacin  15 mg Oral Daily   diltiazem  180 mg Oral Daily   DULoxetine  60 mg Oral BID   feeding supplement  237 mL Oral TID BM   levothyroxine  100 mcg Oral Q0600   mirabegron ER  25 mg Oral Daily   [START ON 04/26/2022] multivitamin with minerals  1 tablet Oral Daily   pantoprazole  40 mg Oral Daily   polyethylene glycol  17 g Oral Daily   rivaroxaban  10 mg Oral Daily   sodium chloride flush  3 mL Intravenous Q12H   Continuous Infusions:  sodium chloride 10 mL/hr at 04/24/22 2101   lactated ringers 100 mL/hr at 04/25/22 0628   meropenem (MERREM) IV 1 g (04/25/22 0523)     LOS: 3 days    Time spent: 35 minutes    Aydien Majette A Staceyann Knouff, MD [facility]   If 7PM-7AM, please contact night-coverage [email or web address]  04/25/2022, 2:35 PM

## 2022-04-25 NOTE — Evaluation (Signed)
Physical Therapy Evaluation Patient Details Name: [name] MRN: [id number] DOB: 05-30-1946 Today's Date: 04/25/2022  History of Present Illness  76 year old with past medical history significant for hypertension, depression, anxiety, CAD, recurrent diffuse large B-cell lymphoma presents to the ED complaining of fatigue, generalized malaise similar to when he has UTI in the past. He was found to be hypotensive when EMS arrive. Patient admitted with sepsis secondary to urinary tract infection E. coli ESBL  Clinical Impression  On eval, pt was Min A +2 for safety for mobility. He walked ~50 feet with a RW. Pt remains orthostatic but he was not symptomatic on today-see vitals section for BP readings. Will plan to follow and progress activity as tolerated.        Recommendations for follow up therapy are one component of a multi-disciplinary discharge planning process, led by the attending physician.  Recommendations may be updated based on patient status, additional functional criteria and insurance authorization.  Follow Up Recommendations Home health PT      Assistance Recommended at Discharge Frequent or constant Supervision/Assistance  Patient can return home with the following  A little help with walking and/or transfers;A little help with bathing/dressing/bathroom;Assistance with cooking/housework;Assist for transportation;Help with stairs or ramp for entrance    Equipment Recommendations None recommended by PT  Recommendations for Other Services       Functional Status Assessment Patient has had a recent decline in their functional status and demonstrates the ability to make significant improvements in function in a reasonable and predictable amount of time.     Precautions / Restrictions Precautions Precautions: Fall Precaution Comments: orthostatic, HR elevated Restrictions Weight Bearing Restrictions: No      Mobility  Bed Mobility Overal bed mobility: Modified  Independent                  Transfers Overall transfer level: Needs assistance Equipment used: Rolling walker (2 wheels) Transfers: Sit to/from Stand  Min guard Assist            General transfer comment: Min guard to stand with walker and ambulate in hall - BP and HR monitored. HR up to 163. Has Afib.    Ambulation/Gait Ambulation/Gait assistance: Min assist, +2 safety/equipment Gait Distance (Feet): 50 Feet Assistive device: Rolling walker (2 wheels) Gait Pattern/deviations: Step-through pattern, Decreased stride length       General Gait Details: Min A to steady. Followed with recliner. Pt denied dizziness however BP 88/53 after walk.  Stairs            Wheelchair Mobility    Modified Rankin (Stroke Patients Only)       Balance Overall balance assessment: History of Falls, Needs assistance         Standing balance support: Bilateral upper extremity supported, During functional activity, Reliant on assistive device for balance Standing balance-Leahy Scale: Fair                               Pertinent Vitals/Pain Pain Assessment Pain Assessment: No/denies pain    Home Living Family/patient expects to be discharged to:: Private residence Living Arrangements: Spouse/significant other Available Help at Discharge: Available 24 hours/day Type of Home: House Home Access: Stairs to enter Entrance Stairs-Rails: Right Entrance Stairs-Number of Steps: 4 Alternate Level Stairs-Number of Steps: has chair lift - still needs to get up 4 steps to chair lift Home Layout: Two level Home Equipment: Rolling Walker (2 wheels);Cane - single  point;Wheelchair - manual Additional Comments: Left rehab on 7/7 and was doing good until UTI.    Prior Function Prior Level of Function : Independent/Modified Independent             Mobility Comments: amb with RW; reports a fall ~ 1 month ago, reports being unsteady andlosing his balance when he moves  too quickly ADLs Comments: independent with ADLs     Hand Dominance   Dominant Hand: Left    Extremity/Trunk Assessment   Upper Extremity Assessment Upper Extremity Assessment: Defer to OT evaluation    Lower Extremity Assessment Lower Extremity Assessment: Generalized weakness    Cervical / Trunk Assessment Cervical / Trunk Assessment: Normal  Communication   Communication: No difficulties  Cognition Arousal/Alertness: Awake/alert Behavior During Therapy: WFL for tasks assessed/performed Overall Cognitive Status: Within Functional Limits for tasks assessed                                          General Comments      Exercises     Assessment/Plan    PT Assessment Patient needs continued PT services  PT Problem List Decreased strength;Decreased balance;Decreased activity tolerance;Decreased mobility;Decreased knowledge of use of DME       PT Treatment Interventions DME instruction;Gait training;Functional mobility training;Therapeutic activities;Balance training;Patient/family education;Therapeutic exercise    PT Goals (Current goals can be found in the Care Plan section)  Acute Rehab PT Goals Patient Stated Goal: get better PT Goal Formulation: With patient Time For Goal Achievement: 05/09/22 Potential to Achieve Goals: Good    Frequency Min 3X/week     Co-evaluation               AM-PAC PT "6 Clicks" Mobility  Outcome Measure Help needed turning from your back to your side while in a flat bed without using bedrails?: None Help needed moving from lying on your back to sitting on the side of a flat bed without using bedrails?: None Help needed moving to and from a bed to a chair (including a wheelchair)?: A Little Help needed standing up from a chair using your arms (e.g., wheelchair or bedside chair)?: A Little Help needed to walk in hospital room?: A Little Help needed climbing 3-5 steps with a railing? : A Little 6 Click  Score: 20    End of Session Equipment Utilized During Treatment: Gait belt Activity Tolerance: Patient tolerated treatment well Patient left: in chair;with call bell/phone within reach   PT Visit Diagnosis: Muscle weakness (generalized) (M62.81);Unsteadiness on feet (R26.81);Difficulty in walking, not elsewhere classified (R26.2)    Time: 9798-9211 PT Time Calculation (min) (ACUTE ONLY): 24 min   Charges:   PT Evaluation $PT Eval Moderate Complexity: 1 Mod            Doreatha Massed, PT Acute Rehabilitation  Office: [phone] Pager: [phone]

## 2022-04-25 NOTE — Progress Notes (Signed)
Pt continues to decline CPAP QHS.   

## 2022-04-25 NOTE — Evaluation (Signed)
Occupational Therapy Evaluation Patient Details Name: [name] MRN: [id number] DOB: 1946/03/19 Today's Date: 04/25/2022   History of Present Illness 76 year old with past medical history significant for hypertension, depression, anxiety, CAD, recurrent diffuse large B-cell lymphoma presents to the ED complaining of fatigue, generalized malaise similar to when he has UTI in the past. He was found to be hypotensive when EMS arrive. Patient admitted with sepsis secondary to urinary tract infection E. coli ESBL   Clinical Impression   [name] is a 76 year old man who presents with above medical history and still orthostatic though not symptomatic. He is able to perform ADLs in seated position and with min guard with standing. He was able to ambulate with 2 for safety. His HR was up to 163 briefly with short ambulation. BP 88/53 with ambulation. Overall he appears physically near his normal and therapist doesn't expect he will have OT needs at discharge.  Will follow acutely to maintain functional abilities and improve activity tolerance so patient can return home at discharge.     Recommendations for follow up therapy are one component of a multi-disciplinary discharge planning process, led by the attending physician.  Recommendations may be updated based on patient status, additional functional criteria and insurance authorization.   Follow Up Recommendations  No OT follow up    Assistance Recommended at Discharge Intermittent Supervision/Assistance  Patient can return home with the following Assistance with cooking/housework;Help with stairs or ramp for entrance    Functional Status Assessment  Patient has had a recent decline in their functional status and demonstrates the ability to make significant improvements in function in a reasonable and predictable amount of time.  Equipment Recommendations  None recommended by OT    Recommendations for Other Services        Precautions / Restrictions Precautions Precautions: Other (comment) Precaution Comments: orthostatic, HR elevated Restrictions Weight Bearing Restrictions: No      Mobility Bed Mobility Overal bed mobility: Modified Independent                  Transfers Overall transfer level: Needs assistance Equipment used: Rolling walker (2 wheels)               General transfer comment: Min guard to stand with walker and ambulate in hall - BP and HR moniored. HR up to 163. Has Afib.      Balance Overall balance assessment: History of Falls                                         ADL either performed or assessed with clinical judgement   ADL Overall ADL's : Needs assistance/impaired Eating/Feeding: Independent   Grooming: Set up;Sitting   Upper Body Bathing: Set up;Sitting   Lower Body Bathing: Min guard;Sit to/from stand   Upper Body Dressing : Set up;Sitting   Lower Body Dressing: Sit to/from stand;Minimal assistance Lower Body Dressing Details (indicate cue type and reason): able to don socks with leg up on bed, needs assistance for TED hose Toilet Transfer: Min guard;Ambulation;Regular Toilet;Grab bars   Toileting- Clothing Manipulation and Hygiene: Min guard;Sit to/from stand       Functional mobility during ADLs: Min guard;Rolling walker (2 wheels)       Vision Patient Visual Report: No change from baseline       Perception     Praxis  Pertinent Vitals/Pain Pain Assessment Pain Assessment: No/denies pain     Hand Dominance Left   Extremity/Trunk Assessment Upper Extremity Assessment Upper Extremity Assessment: Overall WFL for tasks assessed   Lower Extremity Assessment Lower Extremity Assessment: Defer to PT evaluation   Cervical / Trunk Assessment Cervical / Trunk Assessment: Normal   Communication Communication Communication: No difficulties   Cognition Arousal/Alertness: Awake/alert Behavior During Therapy:  WFL for tasks assessed/performed Overall Cognitive Status: Within Functional Limits for tasks assessed                                       General Comments       Exercises     Shoulder Instructions      Home Living Family/patient expects to be discharged to:: Private residence Living Arrangements: Spouse/significant other Available Help at Discharge: Available 24 hours/day Type of Home: House Home Access: Stairs to enter [employer] of Steps: 4 Entrance Stairs-Rails: Right       Bathroom Shower/Tub: Occupational psychologist: Handicapped height Bathroom Accessibility: Yes How Accessible: Accessible via walker Home Equipment: Conservation officer, nature (2 wheels);Cane - single point;Wheelchair - manual   Additional Comments: Left rehab on 7/7 and was doing good until UTI.      Prior Functioning/Environment Prior Level of Function : Independent/Modified Independent             Mobility Comments: amb with RW; reports a fall ~ 1 month ago, reports being unsteady andlosing his balance when he moves too quickly ADLs Comments: independent with ADLs        OT Problem List: Decreased activity tolerance;Cardiopulmonary status limiting activity      OT Treatment/Interventions: Self-care/ADL training;DME and/or AE instruction;Therapeutic activities;Balance training;Patient/family education    OT Goals(Current goals can be found in the care plan section) Acute Rehab OT Goals Patient Stated Goal: Maintain BP OT Goal Formulation: With patient Time For Goal Achievement: 05/09/22 Potential to Achieve Goals: Good  OT Frequency: Min 2X/week    Co-evaluation              AM-PAC OT "6 Clicks" Daily Activity     Outcome Measure Help from another person eating meals?: None Help from another person taking care of personal grooming?: A Little Help from another person toileting, which includes using toliet, bedpan, or urinal?: A Little Help from  another person bathing (including washing, rinsing, drying)?: A Little Help from another person to put on and taking off regular upper body clothing?: A Little Help from another person to put on and taking off regular lower body clothing?: A Little 6 Click Score: 19   End of Session Equipment Utilized During Treatment: Rolling walker (2 wheels);Gait belt Nurse Communication: Mobility status  Activity Tolerance: Patient tolerated treatment well Patient left: in chair;with call bell/phone within reach;with chair alarm set  OT Visit Diagnosis: Muscle weakness (generalized) (M62.81)                Time: 6144-3154 OT Time Calculation (min): 19 min Charges:  OT General Charges $OT Visit: 1 Visit OT Evaluation $OT Eval Low Complexity: 1 Low  [name], OTR/L [place]  Office [phone] Pager: [phone]   Lenward Chancellor 04/25/2022, 12:53 PM

## 2022-04-26 DIAGNOSIS — N390 Urinary tract infection, site not specified: Secondary | ICD-10-CM | POA: Diagnosis not present

## 2022-04-26 DIAGNOSIS — A419 Sepsis, unspecified organism: Secondary | ICD-10-CM | POA: Diagnosis not present

## 2022-04-26 LAB — BASIC METABOLIC PANEL
Anion gap: 5 (ref 5–15)
BUN: 22 mg/dL (ref 8–23)
CO2: 30 mmol/L (ref 22–32)
Calcium: 8.5 mg/dL — ABNORMAL LOW (ref 8.9–10.3)
Chloride: 100 mmol/L (ref 98–111)
Creatinine, Ser: 1.09 mg/dL (ref 0.61–1.24)
GFR, Estimated: >60 mL/min (ref >60)
Glucose, Bld: 114 mg/dL — ABNORMAL HIGH (ref 70–99)
Potassium: 4 mmol/L (ref 3.5–5.1)
Sodium: 135 mmol/L (ref 135–145)

## 2022-04-26 LAB — CBC
HCT: 32.9 % — ABNORMAL LOW (ref 39.0–52.0)
Hemoglobin: 11.1 g/dL — ABNORMAL LOW (ref 13.0–17.0)
MCH: 32.6 pg (ref 26.0–34.0)
MCHC: 33.7 g/dL (ref 30.0–36.0)
MCV: 96.8 fL (ref 80.0–100.0)
Platelets: 58 10*3/uL — ABNORMAL LOW (ref 150–400)
RBC: 3.4 MIL/uL — ABNORMAL LOW (ref 4.22–5.81)
RDW: 16.8 % — ABNORMAL HIGH (ref 11.5–15.5)
WBC: 15.6 10*3/uL — ABNORMAL HIGH (ref 4.0–10.5)
nRBC: 0.3 % — ABNORMAL HIGH (ref 0.0–0.2)

## 2022-04-26 MED ORDER — SODIUM CHLORIDE 0.9 % IV SOLN: INTRAVENOUS | Status: DC

## 2022-04-26 NOTE — TOC Initial Note (Signed)
Transition of Care [facility]) - Initial/Assessment Note    Patient Details  Name: [name] MRN: [id number] Date of Birth: 1946/09/19  Transition of Care [facility]) CM/SW Contact:    Ross Ludwig, LCSW Phone Number: 04/26/2022, 6:27 PM  Clinical Narrative:                  Patient is a 76 year old male who is alert and oriented x4.  CSW was informed by Freddie Breech at [facility] that they are currently open to him for [facility] PT, OT, and RN.  Patient will need new [place] orders once he is medically ready for discharge.  CSW to continue to follow patient's progress throughout discharge planning.  Expected Discharge Plan: [place] Barriers to Discharge: Continued Medical Work up   Patient Goals and CMS Choice Patient states their goals for this hospitalization and ongoing recovery are:: To return back home with home health services CMS Medicare.gov Compare Post Acute Care list provided to:: Patient Choice offered to / list presented to : Patient  Expected Discharge Plan and Services Expected Discharge Plan: [place] Choice: [place] arrangements for the past 2 months: Single Family Home                           HH Arranged: RN, PT, OT [facility] Agency: [facility] Date [facility] Agency Contacted: 04/26/22 Time HH Agency Contacted: 1000 Representative spoke with at [place]: Bradd Canary  Prior Living Arrangements/Services Living arrangements for the past 2 months: [place] with:: Spouse Patient language and need for interpreter reviewed:: Yes Do you feel safe going back to the place where you live?: Yes      Need for Family Participation in Patient Care: No (Comment) Care giver support system in place?: Yes (comment) Current home services: Home PT, Home OT, Home RN Criminal Activity/Legal Involvement Pertinent to Current Situation/Hospitalization: No - Comment as needed  Activities of Daily Living Home  Assistive Devices/Equipment: Walker (specify type) ADL Screening (condition at time of admission) Patient's cognitive ability adequate to safely complete daily activities?: Yes Is the patient deaf or have difficulty hearing?: Yes Does the patient have difficulty seeing, even when wearing glasses/contacts?: No Does the patient have difficulty concentrating, remembering, or making decisions?: No Patient able to express need for assistance with ADLs?: Yes Does the patient have difficulty dressing or bathing?: No Independently performs ADLs?: Yes (appropriate for developmental age) Does the patient have difficulty walking or climbing stairs?: Yes Weakness of Legs: Both Weakness of Arms/Hands: Right  Permission Sought/Granted Permission sought to share information with : Case Manager, Family Supports Permission granted to share information with : Yes, Release of Information Signed  Share Information with NAME: Rush Farmer Spouse [phone]  Permission granted to share info w AGENCY: [place]        Emotional Assessment Appearance:: Appears stated age Attitude/Demeanor/Rapport: Engaged Affect (typically observed): Accepting, Appropriate, Calm, Stable Orientation: : Oriented to Self, Oriented to Place, Oriented to  Time, Oriented to Situation Alcohol / Substance Use: Not Applicable Psych Involvement: No (comment)  Admission diagnosis:  Acute cystitis without hematuria [N30.00] Hypotension [I95.9] Patient Active Problem List   Diagnosis Date Noted   Sepsis secondary to UTI ([place]) 04/22/2022   Encounter for antineoplastic chemotherapy    AKI (acute kidney injury) ([place]) 03/19/2022   DNR (do not resuscitate)/DNI(Do  Not Intubate) 03/19/2022   Lung mass 03/06/2022   Stage 3a chronic kidney disease (CKD) (HCC) - Baseline Scr 1.5 - 1.6 03/06/2022   Allergic rhinitis 01/25/2022   Allergic rhinitis due to animal (cat) (dog) hair and dander 01/25/2022    Allergic rhinitis due to pollen 01/25/2022   Mild intermittent asthma 01/25/2022   Vasomotor rhinitis 01/25/2022   Multiple skin nodules 01/25/2022   Weight loss 01/25/2022   Pure hypercholesterolemia 02/10/2021   Chronic diastolic heart failure ([place]) 06/21/2019   H/O nephrolithotomy with removal of calculi 03/26/2019   Renal calculus 03/26/2019   History of chemotherapy 11/21/2018   Lymphoma in remission ([place]) 11/21/2018   Generalized weakness 11/08/2018   Well adult exam 10/02/2018   Counseling regarding advance care planning and goals of care 07/09/2018   Diffuse large B-cell lymphoma ([place]) 06/23/2018   Port-A-Cath in place 06/23/2018   Hypogonadism in male 11/22/2017   Nephrolithiasis 10/24/2017   Recurrent dislocation of left patella 07/15/2017   Recurrent subluxation of patella, left 07/08/2017   At high risk for falls 05/17/2017   S/P revision of total knee 04/11/2017   Failed total knee, left, initial encounter ([place]) 04/09/2017   Renal calculus, right 02/08/2017   Protein-calorie malnutrition, severe ([place]) 12/08/2016   Chronic obstructive pulmonary disease ([place]) 12/08/2016   [place] renal calculus 11/05/2016   Senile osteoporosis 06/08/2016   Dyslipidemia 01/12/2016   Insomnia secondary to depression with anxiety 12/02/2015   Generalized anxiety disorder 11/19/2015   Wound drainage right hip 11/15/2013   Expected blood loss anemia 09/18/2013   S/P right TH revision 09/17/2013   BPH with obstruction/lower urinary tract symptoms 08/03/2013   History of gastroesophageal reflux (GERD) 08/03/2013   OSA (obstructive sleep apnea) 08/03/2013   Paroxysmal atrial fibrillation ([place]) 08/03/2013   Presence of unspecified artificial hip joint 08/03/2013   Nonunion, fracture, Right tibia  07/03/2013   Chronic anticoagulation 02/21/2013   Fall 02/08/2013   Physical deconditioning 02/08/2013   Low back pain radiating to both legs 02/08/2013   Compression fracture of L1 lumbar  vertebra ([place]) 02/08/2013   Morbid obesity ([place]) 02/05/2013   Anemia 10/06/2012   Essential hypertension 10/03/2012   Psoriasis 10/03/2012   Vitamin D deficiency 10/03/2012   GERD (gastroesophageal reflux disease) 10/03/2012   Anxiety 10/03/2012   Depression 10/03/2012   CAD (coronary artery disease) 10/03/2012   DJD (degenerative joint disease) 10/03/2012   Chronic gingivitis 10/03/2012   Low testosterone, possible hypogonadism 10/01/2012   Normocytic anemia 09/30/2012   Longstanding persistent atrial fibrillation ([place]) 09/29/2012   Hypothyroidism 09/29/2012   History of recurrent UTIs 09/29/2012   PCP:  Cassandria Anger, MD Pharmacy:   [id number] [place], [place]. [place] [address] Phone: [phone] Fax: [place], [place] [address] Phone: [phone] Fax: [phone]  [facility] [place], [place] [address] Phone: [phone] Fax: [phone]     Social Determinants of Health (SDOH) Interventions    Readmission Risk Interventions    03/22/2022   10:07 AM  Readmission Risk Prevention Plan  Transportation Screening Complete  PCP or Specialist Appt within 3-5 Days Complete  HRI or [place] Complete  Social Work Consult for [place] Planning/Counseling Complete  Palliative Care Screening Complete  Medication Review Press photographer) Complete

## 2022-04-26 NOTE — Progress Notes (Addendum)
Physical Therapy Treatment Patient Details Name: [name] MRN: [id number] DOB: 1945/11/18 Today's Date: 04/26/2022   History of Present Illness 76 year old with past medical history significant for hypertension, depression, anxiety, CAD, recurrent diffuse large B-cell lymphoma presents to the ED complaining of fatigue, generalized malaise similar to when he has UTI in the past. He was found to be hypotensive when EMS arrive. Patient admitted with sepsis secondary to urinary tract infection E. coli ESBL    PT Comments    Progressing well. Pt denied dizziness again on today. BP readings are in vitals section of chart. HR did increase to 145 bpm during walk. Dyspnea 2/4. Plan is for HHPT f/u after d/c.    Recommendations for follow up therapy are one component of a multi-disciplinary discharge planning process, led by the attending physician.  Recommendations may be updated based on patient status, additional functional criteria and insurance authorization.  Follow Up Recommendations  Home health PT     Assistance Recommended at Discharge Frequent or constant Supervision/Assistance  Patient can return home with the following A little help with walking and/or transfers;A little help with bathing/dressing/bathroom;Assistance with cooking/housework;Assist for transportation;Help with stairs or ramp for entrance   Equipment Recommendations  None recommended by PT    Recommendations for Other Services       Precautions / Restrictions Precautions Precautions: Fall Precaution Comments: orthostatic, HR elevated Restrictions Weight Bearing Restrictions: No     Mobility  Bed Mobility Overal bed mobility: Modified Independent                  Transfers Overall transfer level: Needs assistance Equipment used: Rolling walker (2 wheels) Transfers: Sit to/from Stand Sit to Stand: Supervision           General transfer comment: Supv for safety. Pt denied dizziness.     Ambulation/Gait Ambulation/Gait assistance: Min guard assist Gait Distance (Feet): 75 Feetx2 Assistive device: Rolling walker (2 wheels) Gait Pattern/deviations: Step-through pattern, Decreased stride length       General Gait Details: Min guard for safety. Pt denied dizziness. Dyspnea 2/4. HR up to 145 bpm during walk. Seated rest break between walks.    Stairs             Wheelchair Mobility    Modified Rankin (Stroke Patients Only)       Balance Overall balance assessment: History of Falls         Standing balance support: Bilateral upper extremity supported, During functional activity, Reliant on assistive device for balance Standing balance-Leahy Scale: Fair                              Cognition Arousal/Alertness: Awake/alert Behavior During Therapy: WFL for tasks assessed/performed Overall Cognitive Status: Within Functional Limits for tasks assessed                                          Exercises      General Comments        Pertinent Vitals/Pain Pain Assessment Pain Assessment: No/denies pain    Home Living                          Prior Function            PT Goals (current goals can now be found in  the care plan section) Progress towards PT goals: Progressing toward goals    Frequency    Min 3X/week      PT Plan Current plan remains appropriate    Co-evaluation              AM-PAC PT "6 Clicks" Mobility   Outcome Measure  Help needed turning from your back to your side while in a flat bed without using bedrails?: None Help needed moving from lying on your back to sitting on the side of a flat bed without using bedrails?: None Help needed moving to and from a bed to a chair (including a wheelchair)?: A Little Help needed standing up from a chair using your arms (e.g., wheelchair or bedside chair)?: A Little Help needed to walk in hospital room?: A Little Help needed  climbing 3-5 steps with a railing? : A Little 6 Click Score: 20    End of Session Equipment Utilized During Treatment: Gait belt Activity Tolerance: Patient tolerated treatment well Patient left: in chair;with call bell/phone within reach   PT Visit Diagnosis: Muscle weakness (generalized) (M62.81);Unsteadiness on feet (R26.81);Difficulty in walking, not elsewhere classified (R26.2)     Time: 8206-0156 PT Time Calculation (min) (ACUTE ONLY): 34 min  Charges:  $Gait Training: 23-37 mins                         Doreatha Massed, PT Acute Rehabilitation  Office: [phone] Pager: [phone]

## 2022-04-26 NOTE — Progress Notes (Signed)
Pt offered CPAP and pt declined for 3rd time. Pt ordered d/c'd at this time.

## 2022-04-26 NOTE — TOC Progression Note (Deleted)
Transition of Care [facility]) - Progression Note    Patient Details  Name: [name] MRN: [id number] Date of Birth: [birth year]  Transition of Care [facility]) CM/SW Contact  Ross Ludwig, [place] Phone Number: 04/26/2022, 6:22 PM  Clinical Narrative:     CSW was informed by Freddie Breech at [facility] that they are currently open to him for [facility] PT, OT, and RN.  CSW to continue to follow patient's progress throughout discharge planning.   Expected Discharge Plan: [place] Barriers to Discharge: Continued Medical Work up  Expected Discharge Plan and Services Expected Discharge Plan: [place] Choice: [place] arrangements for the past 2 months: Single Family Home                                       Social Determinants of Health (SDOH) Interventions    Readmission Risk Interventions    03/22/2022   10:07 AM  Readmission Risk Prevention Plan  Transportation Screening Complete  PCP or Specialist Appt within 3-5 Days Complete  HRI or [place] Complete  Social Work Consult for [place] Planning/Counseling Complete  Palliative Care Screening Complete  Medication Review Press photographer) Complete

## 2022-04-26 NOTE — Progress Notes (Signed)
PROGRESS NOTE    [name]  HCW:[id number] DOB: [birth year] DOA: 04/22/2022 PCP: Cassandria Anger, MD   Brief Narrative: 76 year old with past medical history significant for hypertension, depression, anxiety, CAD, recurrent diffuse large B-cell lymphoma presents to the ED complaining of fatigue, generalized malaise similar to when he has UTI in the past.  He reports 4 days of fatigue malaise and lightheadedness.  He was found to be hypotensive when EMS arrive.  He received IV fluids.  Evaluation in the ED he was found to have leukocytosis 26,000, lactic acidosis at 4.6.  He received IV fluids and IV antibiotics.  Patient admitted with sepsis secondary to urinary tract infection E. coli ESBL.   Assessment & Plan:   Principal Problem:   Sepsis secondary to UTI [facility]) Active Problems:   Hypothyroidism   Essential hypertension   Chronic obstructive pulmonary disease (HCC)   Diffuse large B-cell lymphoma (HCC)   Paroxysmal atrial fibrillation (HCC)   Depression   CAD (coronary artery disease)   OSA (obstructive sleep apnea)   Generalized anxiety disorder   1-Sepsis secondary to urinary tract infection: Patient presented with malaise fatigue, UA with more than 50 white blood cell, leukocytosis hypotension and lactic acidosis -He received Give IV fluids -Continue with IV fluids maintenance -Urine culture grew E coli ESBL. IV antibiotics change to Meropenem 6/15. -Blood culture; no growth to date He could be discharge on Nitrofurantoin at time of discharge.   2-CAD: Denies chest pain.  Continue with  statins. Hold aspirin due to thrombocytopenia.   3-Near syncope-Orthostatic Hypotension. Patient develops near syncope episode while trying to walk in the room with RN 6/15. Orthostatic vitals positive.  -Continue with IV fluids.  -Cortisol level 7 -Reduce Cardizem dose.  -less symptomatic 7/16.[name] Continue with IV fluids. Ted hose.  Repeat orthostatic vitals today.    4-Diffuse large B-cell lymphoma: - Started chemo with [facility] on 03/22/22 for recurrence  -Needs to follow up with Dr Irene Limbo.      5-Depression, anxiety: Continue with Wellbutrin, Cymbalta: Klonopin  6-COPD, OSA: Continue with albuterol, CPAP nightly  PAF: Continue with Cardizem as blood pressure allows and continue, with  Xarelto.  Reduce cardizem due to orthostatic hypotension.   Anemia of malignancy. Monitor hb. Hb stable.  Thrombocytopenia. Suspect related to acute infectious process. Trending down. Hold aspirin. Monitor closely on Xarelto. No evidence of bleeding.   Hypertension: Continue to hold blood pressure medications.  He was initially hypotensive. Estimated body mass index is 30.84 kg/m as calculated from the following:   Height as of this encounter: 6' 3.5" (1.918 m).   Weight as of this encounter: 113.4 kg.   DVT prophylaxis: Xarelto Code Status: DNR Family Communication: Care discussed with patient Disposition Plan:  Status is: Inpatient Remains inpatient appropriate because: needs IV fluid and IV antibiotics.     Consultants:  Dr Irene Limbo added to treatment team.   Procedures:  none  Antimicrobials:  Cefepime   Subjective: He is feeling better. He would like PT to discussed with his Husband recommendation for [facility]. He may need more assistance  Objective: Vitals:   04/25/22 1210 04/25/22 1441 04/25/22 2125 04/26/22 0536  BP: 99/67 (!) 111/51 (!) 112/57 128/61  Pulse: 83 91 93 77  Resp: '20  18 20  '$ Temp: 98.8 F (37.1 C)  99.3 F (37.4 C) 98.4 F (36.9 C)  TempSrc: Oral  Oral Oral  SpO2: 99%  97% 98%  Weight:      Height:  Intake/Output Summary (Last 24 hours) at 04/26/2022 1341 Last data filed at 04/26/2022 1300 Gross per 24 hour  Intake 2479.12 ml  Output 3100 ml  Net -620.88 ml    Filed Weights   04/22/22 1637  Weight: 113.4 kg    Examination:  General exam: NAD Respiratory system: CTA Cardiovascular system: S 1, S 2  RRR Gastrointestinal system: BS present, soft, nt Central nervous system: Alert, conversant.  Extremities: no edema   Data Reviewed: I have personally reviewed following labs and imaging studies  CBC: Recent Labs  Lab 04/22/22 1746 04/23/22 0418 04/24/22 0416 04/25/22 0351 04/26/22 0408  WBC 26.9* 24.0* 16.5* 12.8* 15.6*  NEUTROABS 26.1*  --   --   --   --   HGB 10.2* 10.2* 10.5* 10.6* 11.1*  HCT 29.0* 29.4* 30.8* 30.9* 32.9*  MCV 93.2 93.6 94.8 94.5 96.8  PLT 126* 102* 90* 77* 58*    Basic Metabolic Panel: Recent Labs  Lab 04/22/22 1746 04/23/22 0418 04/24/22 0416 04/25/22 0351 04/26/22 0408  NA 132* 138 137 133* 135  K 4.7 4.6 4.3 3.8 4.0  CL 103 105 103 100 100  CO2 21* '24 27 27 30  '$ GLUCOSE 159* 127* 99 136* 114*  BUN 44* 34* 28* 27* 22  CREATININE 1.39* 1.03 1.01 1.15 1.09  CALCIUM 8.2* 8.3* 8.4* 8.5* 8.5*    GFR: Estimated Creatinine Clearance: 78.9 mL/min (by C-G formula based on SCr of 1.09 mg/dL). Liver Function Tests: Recent Labs  Lab 04/22/22 1746 04/23/22 0418  AST 29 25  ALT 57* 54*  ALKPHOS 60 59  BILITOT 1.0 1.0  PROT 5.5* 5.4*  ALBUMIN 2.7* 2.8*    Recent Labs  Lab 04/22/22 1746  LIPASE 24    No results for input(s): "AMMONIA" in the last 168 hours. Coagulation Profile: No results for input(s): "INR", "PROTIME" in the last 168 hours. Cardiac Enzymes: No results for input(s): "CKTOTAL", "CKMB", "CKMBINDEX", "TROPONINI" in the last 168 hours. BNP (last 3 results) No results for input(s): "PROBNP" in the last 8760 hours. HbA1C: No results for input(s): "HGBA1C" in the last 72 hours. CBG: No results for input(s): "GLUCAP" in the last 168 hours. Lipid Profile: No results for input(s): "CHOL", "HDL", "LDLCALC", "TRIG", "CHOLHDL", "LDLDIRECT" in the last 72 hours. Thyroid Function Tests: No results for input(s): "TSH", "T4TOTAL", "FREET4", "T3FREE", "THYROIDAB" in the last 72 hours. Anemia Panel: No results for input(s):  "VITAMINB12", "FOLATE", "FERRITIN", "TIBC", "IRON", "RETICCTPCT" in the last 72 hours. Sepsis Labs: Recent Labs  Lab 04/22/22 1746  LATICACIDVEN 2.6*     Recent Results (from the past 240 hour(s))  Blood culture (routine x 2)     Status: None (Preliminary result)   Collection Time: 04/22/22  5:46 PM   Specimen: BLOOD  Result Value Ref Range Status   Specimen Description   Final    BLOOD BLOOD RIGHT FOREARM Performed at [facility] [address], [place], [place] [address]    Special Requests   Final    BOTTLES DRAWN AEROBIC AND ANAEROBIC Blood Culture results may not be optimal due to an excessive volume of blood received in culture bottles Performed at [facility] [address], [place], [place] [address]    Culture   Final    NO GROWTH 4 DAYS Performed at [facility], [place] [address], [place], [place] [address]    Report Status PENDING  Incomplete  Blood culture (routine x 2)     Status:  None (Preliminary result)   Collection Time: 04/22/22  5:58 PM   Specimen: BLOOD  Result Value Ref Range Status   Specimen Description   Final    BLOOD BLOOD RIGHT WRIST Performed at [facility] [address] [place], [place] [address]    Special Requests   Final    BOTTLES DRAWN AEROBIC AND ANAEROBIC Blood Culture adequate volume Performed at [facility] [address] [place], [place] [address]    Culture   Final    NO GROWTH 4 DAYS Performed at [facility], [place] [address] [place], [place] [address]    Report Status PENDING  Incomplete  Urine Culture     Status: Abnormal   Collection Time: 04/22/22  8:07 PM   Specimen: Urine, Clean Catch  Result Value Ref Range Status   Specimen Description   Final    URINE, CLEAN CATCH Performed at [facility], [place] [address] [place], [place] [address]    Special Requests   Final    NONE Performed at [facility], [place] [address], [place], [place] [address]    Culture (A)  Final    >=100,000 COLONIES/mL ESCHERICHIA COLI Confirmed Extended Spectrum Beta-Lactamase Producer (ESBL).  In bloodstream infections from ESBL organisms, carbapenems are preferred over piperacillin/tazobactam. They are shown to have a lower risk of mortality.    Report Status 04/24/2022 FINAL  Final   Organism ID, Bacteria ESCHERICHIA COLI (A)  Final      Susceptibility   Escherichia coli - MIC*    AMPICILLIN >=32 RESISTANT Resistant     CEFAZOLIN >=64 RESISTANT Resistant     CEFEPIME 2 SENSITIVE Sensitive     CEFTRIAXONE 32 RESISTANT Resistant     CIPROFLOXACIN >=4 RESISTANT Resistant     GENTAMICIN >=16 RESISTANT Resistant     IMIPENEM <=0.25 SENSITIVE Sensitive     NITROFURANTOIN <=16 SENSITIVE Sensitive     TRIMETH/SULFA >=320 RESISTANT Resistant     AMPICILLIN/SULBACTAM 16 INTERMEDIATE Intermediate     PIP/TAZO <=4 SENSITIVE Sensitive     * >=100,000 COLONIES/mL ESCHERICHIA COLI         Radiology Studies: No results found.      Scheduled Meds:  alfuzosin  10 mg Oral QHS   atorvastatin  80 mg Oral Daily   buPROPion  300 mg Oral Daily   darifenacin  15 mg Oral Daily   diltiazem  180 mg Oral Daily   DULoxetine  60 mg Oral BID   feeding supplement  237 mL Oral TID BM   levothyroxine  100 mcg Oral Q0600   mirabegron ER  25 mg Oral Daily   multivitamin with minerals  1 tablet Oral Daily   pantoprazole  40 mg Oral Daily   polyethylene glycol  17 g Oral Daily   rivaroxaban  10 mg Oral Daily   sodium chloride flush  3 mL Intravenous Q12H   Continuous Infusions:  sodium chloride 10 mL/hr at 04/24/22 2101   lactated ringers 100 mL/hr at 04/25/22 2303   meropenem (MERREM) IV 1 g (04/26/22 0522)     LOS: 4 days    Time spent: 35 minutes    Casie Sturgeon A Jeannifer [place], MD [facility]   If 7PM-7AM, please contact night-coverage [email or web address]  04/26/2022, 1:41 PM

## 2022-04-26 NOTE — Care Management Important Message (Signed)
Important Message  Patient Details IM Letter given to the Patient. Name: [name] MRN: [id number] Date of Birth: 26-Aug-1946   Medicare Important Message Given:  Yes     Kerin Salen 04/26/2022, 12:52 PM

## 2022-04-27 ENCOUNTER — Other Ambulatory Visit (HOSPITAL_COMMUNITY): Payer: Self-pay

## 2022-04-27 ENCOUNTER — Encounter: Payer: Self-pay | Admitting: Hematology

## 2022-04-27 DIAGNOSIS — N390 Urinary tract infection, site not specified: Secondary | ICD-10-CM | POA: Diagnosis not present

## 2022-04-27 DIAGNOSIS — I951 Orthostatic hypotension: Secondary | ICD-10-CM

## 2022-04-27 DIAGNOSIS — A419 Sepsis, unspecified organism: Secondary | ICD-10-CM | POA: Diagnosis not present

## 2022-04-27 LAB — BASIC METABOLIC PANEL
Anion gap: 7 (ref 5–15)
BUN: 19 mg/dL (ref 8–23)
CO2: 27 mmol/L (ref 22–32)
Calcium: 8.3 mg/dL — ABNORMAL LOW (ref 8.9–10.3)
Chloride: 102 mmol/L (ref 98–111)
Creatinine, Ser: 1.05 mg/dL (ref 0.61–1.24)
GFR, Estimated: >60 mL/min (ref >60)
Glucose, Bld: 141 mg/dL — ABNORMAL HIGH (ref 70–99)
Potassium: 3.8 mmol/L (ref 3.5–5.1)
Sodium: 136 mmol/L (ref 135–145)

## 2022-04-27 LAB — CULTURE, BLOOD (ROUTINE X 2)
Culture: NO GROWTH
Culture: NO GROWTH
Special Requests: ADEQUATE

## 2022-04-27 LAB — CBC
HCT: 30.4 % — ABNORMAL LOW (ref 39.0–52.0)
Hemoglobin: 10.3 g/dL — ABNORMAL LOW (ref 13.0–17.0)
MCH: 33 pg (ref 26.0–34.0)
MCHC: 33.9 g/dL (ref 30.0–36.0)
MCV: 97.4 fL (ref 80.0–100.0)
Platelets: 46 10*3/uL — ABNORMAL LOW (ref 150–400)
RBC: 3.12 MIL/uL — ABNORMAL LOW (ref 4.22–5.81)
RDW: 18.2 % — ABNORMAL HIGH (ref 11.5–15.5)
WBC: 11.7 10*3/uL — ABNORMAL HIGH (ref 4.0–10.5)
nRBC: 0.4 % — ABNORMAL HIGH (ref 0.0–0.2)

## 2022-04-27 MED ORDER — METOPROLOL TARTRATE 25 MG PO TABS
25.0000 mg | ORAL_TABLET | Freq: Every morning | ORAL | 0 refills | Status: DC
  Filled 2022-04-27: qty 30, 30d supply, fill #0

## 2022-04-27 MED ORDER — POLYETHYLENE GLYCOL 3350 17 G PO PACK
17.0000 g | PACK | Freq: Every day | ORAL | 0 refills | Status: DC
  Filled 2022-04-27: qty 14, 14d supply, fill #0

## 2022-04-27 MED ORDER — DILTIAZEM HCL ER COATED BEADS 180 MG PO CP24
180.0000 mg | ORAL_CAPSULE | Freq: Every day | ORAL | 0 refills | Status: DC
  Filled 2022-04-27: qty 30, 30d supply, fill #0

## 2022-04-27 MED ORDER — NITROFURANTOIN MONOHYD MACRO 100 MG PO CAPS
100.0000 mg | ORAL_CAPSULE | Freq: Two times a day (BID) | ORAL | 0 refills | Status: AC
  Filled 2022-04-27: qty 6, 3d supply, fill #0

## 2022-04-27 NOTE — TOC Transition Note (Signed)
Transition of Care [facility]) - CM/SW Discharge Note   Patient Details  Name: [name] MRN: [id number] Date of Birth: Sep 22, 1946  Transition of Care [facility]) CM/SW Contact:  Joaquin Courts, RN Phone Number: 04/27/2022, 11:03 AM   Clinical Narrative:    CM confirmed [place] orders in place to resume services at discharge. No further TOC needs identified.   Final next level of care: [place] Barriers to Discharge: No Barriers Identified   Patient Goals and CMS Choice Patient states their goals for this hospitalization and ongoing recovery are:: To return back home with home health services CMS Medicare.gov Compare Post Acute Care list provided to:: Patient Choice offered to / list presented to : Patient  Discharge Placement                       Discharge Plan and Services     Post Acute Care Choice: Home Health                    HH Arranged: RN, PT, OT [facility] Agency: [facility] Date [place]: 04/26/22 Time HH Agency Contacted: 1000 Representative spoke with at [place]: [place] Determinants of Health ([place]) Interventions     Readmission Risk Interventions    04/27/2022    9:56 AM 04/26/2022    6:28 PM 03/22/2022   10:07 AM  Readmission Risk Prevention Plan  Transportation Screening  Complete Complete  PCP or Specialist Appt within 3-5 Days   Complete  HRI or [place]   Complete  Social Work Consult for [place] Planning/Counseling   Complete  Palliative Care Screening   Complete  Medication Review Press photographer)  Referral to Pharmacy Complete  PCP or Specialist appointment within 3-5 days of discharge Complete    HRI or [place]  Complete   SW Recovery Care/Counseling Consult Complete    [place]  Not Applicable

## 2022-04-27 NOTE — Discharge Summary (Addendum)
Physician Discharge Summary   Patient: [name] MRN: [id number] DOB: March 12, 1946  Admit date:     04/22/2022  Discharge date: 04/27/22  Discharge Physician: Elmarie Shiley   PCP: Cassandria Anger, MD   Recommendations at discharge:   Xarelto and aspirin on hold  due to Thrombocytopenia  Needs lab work early next week, if platelet count recovering consider resumption of xarelto.  Follow up on resolution of UTI.  Monitor for orthostatic hypotension., adjustment, resumption of medication HTN, CAD.  Cardizem dose reduce due to orthostatic hypotension.   Discharge Diagnoses: Principal Problem:   Sepsis secondary to UTI [facility]) Active Problems:   Hypothyroidism   Essential hypertension   Chronic obstructive pulmonary disease (HCC)   Diffuse large B-cell lymphoma (HCC)   Paroxysmal atrial fibrillation (HCC)   Depression   CAD (coronary artery disease)   OSA (obstructive sleep apnea)   Generalized anxiety disorder   Orthostatic hypotension  Resolved Problems:   * No resolved hospital problems. *  Hospital Course: 76 year old with past medical history significant for hypertension, depression, anxiety, CAD, recurrent diffuse large B-cell lymphoma presents to the ED complaining of fatigue, generalized malaise similar to when he has UTI in the past.  He reports 4 days of fatigue malaise and lightheadedness.  He was found to be hypotensive when EMS arrive.  He received IV fluids.   Evaluation in the ED he was found to have leukocytosis 26,000, lactic acidosis at 4.6.  He received IV fluids and IV antibiotics.   Patient admitted with sepsis secondary to urinary tract infection E. coli ESBL.     Assessment and Plan:   1-Severe Sepsis secondary to urinary tract infection: Patient presented with malaise fatigue, UA with more than 50 white blood cell, leukocytosis hypotension and lactic acidosis -He received Give IV fluids -Continue with IV fluids maintenance -Urine culture  grew E coli ESBL. IV antibiotics change to Meropenem 6/15.--6/18 -Blood culture; no growth to date -Plan to discharge on Nitrofurantoin for three days.    2-CAD: Denies chest pain.  Continue with  statins. Hold aspirin due to thrombocytopenia.    3-Near syncope-Orthostatic Hypotension. Patient develops near syncope episode while trying to walk in the room with RN 6/15. Orthostatic vitals positive.  -Treated  with IV fluids.  -Cortisol level 7 -Reduce Cardizem dose.  -Less symptomatic 7/16.[name] Continue with IV fluids. Ted hose.  Stable, Imdur HCTZ discontinue., Cardizem dose reduce.    4-Diffuse large B-cell lymphoma: - Started chemo with [facility] on 03/22/22 for recurrence  -Needs to follow up with Dr Irene Limbo.      5-Depression, anxiety: Continue with Wellbutrin, Cymbalta: Klonopin   6-Thrombocytopenia. Suspect related to acute infectious process, antibiotics. Trending down. Hold aspirin.Hold  Xarelto. No evidence of bleeding.   Discussed case with Dr Sherren Kerns, ok to discharge home. Continue to hold aspirin and xarelto. Thrombocytopenia, related to antibiotics. He has appointment next week at cancer center, plan to check labs at that time.    COPD, OSA: Continue with albuterol, CPAP nightly   PAF: Continue with Cardizem as blood pressure allows.  Hold xarelto due to Thrombocytopenia.  Reduce cardizem due to orthostatic hypotension.  Resume metoprolol   Anemia of malignancy. Monitor hb. Hb stable.   Hypertension: Continue to hold blood pressure medications.  He was initially hypotensive. Estimated body mass index is 30.84 kg/m as calculated from the following:   Height as of this encounter: 6' 3.5" (1.918 m).   Weight as of this encounter: 113.4 kg.  Consultants: Dr Sherren Kerns Procedures performed: None Disposition: Home Diet recommendation:  Discharge Diet Orders (From admission, onward)     Start     Ordered   04/27/22 0000  Diet - low sodium heart healthy         04/27/22 1009           Cardiac diet DISCHARGE MEDICATION: Allergies as of 04/27/2022       Reactions   Short Ragweed Pollen Ext Cough        Medication List     STOP taking these medications    amoxicillin 500 MG capsule Commonly known as: AMOXIL   aspirin EC 81 MG tablet   hydrochlorothiazide 12.5 MG tablet Commonly known as: HYDRODIURIL   isosorbide mononitrate 30 MG 24 hr tablet Commonly known as: IMDUR   losartan 50 MG tablet Commonly known as: COZAAR   rivaroxaban 10 MG Tabs tablet Commonly known as: Xarelto   Skyrizi Pen 150 MG/ML Soaj Generic drug: Risankizumab-rzaa       TAKE these medications    acetaminophen 325 MG tablet Commonly known as: TYLENOL Take 2 tablets (650 mg total) by mouth every 6 (six) hours as needed for mild pain (or Fever >/= 101).   albuterol 108 (90 Base) MCG/ACT inhaler Commonly known as: VENTOLIN HFA Inhale 2 puffs into the lungs every 6 (six) hours as needed for wheezing or shortness of breath.   alfuzosin 10 MG 24 hr tablet Commonly known as: UROXATRAL TAKE 1 TABLET AT BEDTIME   allopurinol 100 MG tablet Commonly known as: ZYLOPRIM Take 1 tablet (100 mg total) by mouth 2 (two) times daily.   atorvastatin 80 MG tablet Commonly known as: LIPITOR Take 80 mg by mouth at bedtime.   buPROPion 300 MG 24 hr tablet Commonly known as: WELLBUTRIN XL TAKE 1 TABLET EVERY DAY WITH BREAKFAST What changed: See the new instructions.   CALCIUM CITRATE + D PO Take 1 tablet by mouth 2 (two) times daily. '1200mg'$  of Calcium and 1000units vitamin D3   clonazePAM 1 MG tablet Commonly known as: KLONOPIN Take 1 tablet (1 mg total) by mouth every 6 (six) hours as needed for anxiety. Take 1 tablet (1 mg total) by mouth every 6 (six) hours as needed. for anxiety Strength: 1 mg What changed:  when to take this additional instructions   CRANBERRY PO Take 2 tablets by mouth every morning.   denosumab 60 MG/ML Sosy  injection Commonly known as: PROLIA Inject 60 mg as directed every 6 (six) months.   dexamethasone 4 MG tablet Commonly known as: DECADRON Take 5 tablets (20 mg total) by mouth daily. Start the day after carboplatin chemotherapy for 3 days. What changed:  when to take this additional instructions   diltiazem 180 MG 24 hr capsule Commonly known as: CARDIZEM CD Take 1 capsule by mouth daily. Start taking on: April 28, 2022 What changed:  medication strength how much to take   Docusate Sodium 100 MG capsule Take 100 mg by mouth 2 (two) times daily.   DULoxetine 60 MG capsule Commonly known as: CYMBALTA TAKE 1 CAPSULE TWICE DAILY What changed:  when to take this additional instructions   EPINEPHrine 0.3 mg/0.3 mL Soaj injection Commonly known as: EPI-PEN Inject 0.3 mg into the muscle once as needed for anaphylaxis (severe allergic reaction).   ezetimibe 10 MG tablet Commonly known as: ZETIA TAKE 1 TABLET EVERY DAY What changed: when to take this   feeding supplement Liqd Take 237 mLs by mouth  2 (two) times daily between meals. What changed: when to take this   ferrous sulfate 325 (65 FE) MG tablet Take 325 mg by mouth 2 (two) times daily with a meal.   HYDROcodone-acetaminophen 7.5-325 MG tablet Commonly known as: NORCO Take 1 tablet by mouth every 6 (six) hours as needed for severe pain.   levothyroxine 100 MCG tablet Commonly known as: SYNTHROID Take 100 mcg by mouth daily before breakfast.   lidocaine-prilocaine cream Commonly known as: EMLA Apply 1 Application topically as needed.   loratadine 10 MG tablet Commonly known as: CLARITIN Take 10 mg by mouth every morning.   Magnesium 500 MG Caps Take 1 capsule (500 mg total) by mouth daily. What changed: when to take this   metoprolol tartrate 25 MG tablet Commonly known as: LOPRESSOR Take 1 tablet by mouth every morning.   multivitamin with minerals Tabs tablet Take 1 tablet by mouth every morning.  Men's One-A-Day 50+   Myrbetriq 25 MG Tb24 tablet Generic drug: mirabegron ER TAKE 1 TABLET EVERY DAY What changed:  how much to take when to take this   nitrofurantoin (macrocrystal-monohydrate) 100 MG capsule Commonly known as: Macrobid Take 1 capsule by mouth 2 times daily for 3 days.   nitroGLYCERIN 0.4 MG SL tablet Commonly known as: NITROSTAT Place 0.4 mg under the tongue every 5 (five) minutes as needed for chest pain (max 3 doses).   omeprazole 40 MG capsule Commonly known as: PRILOSEC TAKE 1 CAPSULE EVERY DAY BEFORE BREAKFAST What changed: See the new instructions.   ondansetron 8 MG tablet Commonly known as: Zofran Take 1 tablet (8 mg total) by mouth 2 (two) times daily as needed. Start on the 3rd day after cisplatin chemotherapy. What changed: reasons to take this   polyethylene glycol 17 g packet Commonly known as: MIRALAX / GLYCOLAX Take 17 g by mouth daily. Start taking on: April 28, 2022   Potassium Citrate 15 MEQ (1620 MG) Tbcr TAKE 1 TABLET TWICE DAILY What changed:  when to take this additional instructions   PROBIOTIC PO Take 1 capsule by mouth daily with breakfast.   prochlorperazine 10 MG tablet Commonly known as: COMPAZINE Take 1 tablet (10 mg total) by mouth every 6 (six) hours as needed (Nausea or vomiting).   solifenacin 10 MG tablet Commonly known as: VESICARE Take 10 mg by mouth daily with supper.   testosterone enanthate 200 MG/ML injection Commonly known as: DELATESTRYL INJECT 1ML ('200MG'$ ) INTO THE MUSCLE EVERY 14 DAYS. DISCARD VIAL AFTER 28 DAYS. What changed:  how much to take how to take this when to take this additional instructions   vitamin C 500 MG tablet Commonly known as: ASCORBIC ACID Take 500 mg by mouth daily with lunch.   VITAMIN D-3 PO Take 1 capsule by mouth 2 (two) times daily.        Discharge Exam: Filed Weights   04/22/22 1637  Weight: 113.4 kg   General; NAD Lung; CTA  Condition at discharge:  stable  The results of significant diagnostics from this hospitalization (including imaging, microbiology, ancillary and laboratory) are listed below for reference.   Imaging Studies: CT Angio Chest PE W and/or Wo Contrast  Result Date: 04/22/2022 CLINICAL DATA:  Concern for pulmonary embolism. Diffuse large B-cell lymphoma. EXAM: CT ANGIOGRAPHY CHEST WITH CONTRAST TECHNIQUE: Multidetector CT imaging of the chest was performed using the standard protocol during bolus administration of intravenous contrast. Multiplanar CT image reconstructions and MIPs were obtained to evaluate the vascular anatomy. RADIATION  DOSE REDUCTION: This exam was performed according to the departmental dose-optimization program which includes automated exposure control, adjustment of the mA and/or kV according to patient size and/or use of iterative reconstruction technique. CONTRAST:  137m OMNIPAQUE IOHEXOL 350 MG/ML SOLN COMPARISON:  Chest radiograph dated 04/22/2022 and CT dated 12/11/2016. PET CT dated 03/02/2022. FINDINGS: Evaluation of this exam is limited due to respiratory motion artifact. Cardiovascular: There is no cardiomegaly or pericardial effusion. There is coronary vascular calcification. Moderate atherosclerotic calcification of the thoracic aorta. No aneurysmal dilatation or dissection. Evaluation of the pulmonary arteries is limited due to respiratory motion artifact and suboptimal visualization of the peripheral branches. No central pulmonary artery embolus identified. Right-sided Port-A-Cath with tip in the central SVC. Mediastinum/Nodes: Mildly enlarged lymph node to the left of the esophagus measures 11 mm short axis (27/4). No hilar or other mediastinal adenopathy. The esophagus is grossly unremarkable. No mediastinal fluid collection. Lungs/Pleura: There is a 2.7 x 2.5 cm right lower lobe nodule concerning for malignancy. No focal consolidation, pleural effusion, or pneumothorax. The central airways are  patent. Upper Abdomen: Bilateral adrenal thickening. Musculoskeletal: Osteopenia with degenerative changes of the spine. Old healed right rib fractures. Fractures of the T5-T10 spinous process. Review of the MIP images confirms the above findings. IMPRESSION: 1. No CT evidence of central pulmonary artery embolus. 2. Right lower lobe nodule concerning for malignancy. 3. Mildly enlarged left paraesophageal lymph node. 4. Fractures of the T5-T10 spinous process. 5. Aortic Atherosclerosis (ICD10-I70.0). Electronically Signed   By: AAnner CreteM.D.   On: 04/22/2022 21:11   CT HEAD WO CONTRAST (5MM)  Result Date: 04/22/2022 CLINICAL DATA:  Dizziness EXAM: CT HEAD WITHOUT CONTRAST TECHNIQUE: Contiguous axial images were obtained from the base of the skull through the vertex without intravenous contrast. RADIATION DOSE REDUCTION: This exam was performed according to the departmental dose-optimization program which includes automated exposure control, adjustment of the mA and/or kV according to patient size and/or use of iterative reconstruction technique. COMPARISON:  03/22/2022 FINDINGS: Brain: There is atrophy and chronic small vessel disease changes. No acute intracranial abnormality. Specifically, no hemorrhage, hydrocephalus, mass lesion, acute infarction, or significant intracranial injury. Vascular: No hyperdense vessel or unexpected calcification. Skull: No acute calvarial abnormality. Sinuses/Orbits: No acute findings Other: None IMPRESSION: Atrophy, chronic microvascular disease. No acute intracranial abnormality. Electronically Signed   By: KRolm BaptiseM.D.   On: 04/22/2022 17:16   DG Chest Portable 1 View  Result Date: 04/22/2022 CLINICAL DATA:  dizziness, hypotension EXAM: PORTABLE CHEST 1 VIEW COMPARISON:  Mar 06, 2022 FINDINGS: Cardiomediastinal silhouette is mildly prominent and stable. Thoracic aorta is ectatic. Again seen is the right-sided Port-A-Cath with its tip at the upper SVC region.  There is no pulmonary consolidation, pleural effusion or vascular congestion. The visualized skeletal structures are unremarkable. IMPRESSION: No active disease. Electronically Signed   By: AFrazier RichardsM.D.   On: 04/22/2022 17:04    Microbiology: Results for orders placed or performed during the hospital encounter of 04/22/22  Blood culture (routine x 2)     Status: None (Preliminary result)   Collection Time: 04/22/22  5:46 PM   Specimen: BLOOD  Result Value Ref Range Status   Specimen Description   Final    BLOOD BLOOD RIGHT FOREARM Performed at [facility], [place] [address]   Special Requests   Final    BOTTLES DRAWN AEROBIC AND ANAEROBIC Blood Culture results may not be optimal due to an excessive  volume of blood received in culture bottles Performed at [facility] [address], [place], [place] [address]    Culture   Final    NO GROWTH 4 DAYS Performed at [facility], [place] [address], [place], [place] [address]    Report Status PENDING  Incomplete  Blood culture (routine x 2)     Status: None (Preliminary result)   Collection Time: 04/22/22  5:58 PM   Specimen: BLOOD  Result Value Ref Range Status   Specimen Description   Final    BLOOD BLOOD RIGHT WRIST Performed at [facility] [address] [place], [place] [address]    Special Requests   Final    BOTTLES DRAWN AEROBIC AND ANAEROBIC Blood Culture adequate volume Performed at [facility] [address], [place], [place] [address]    Culture   Final    NO GROWTH 4 DAYS Performed at [facility], [place] [address], [place], [place] [address]    Report Status PENDING  Incomplete  Urine Culture     Status: Abnormal   Collection Time: 04/22/22  8:07 PM   Specimen: Urine, Clean Catch  Result Value Ref Range Status   Specimen Description   Final    URINE, CLEAN CATCH Performed at [facility], [place] [address] [place], [place] [address]    Special Requests   Final    NONE Performed at [facility], [place] [address] [place], [place] [address]    Culture (A)  Final    >=100,000 COLONIES/mL ESCHERICHIA COLI Confirmed Extended Spectrum Beta-Lactamase Producer (ESBL).  In bloodstream infections from ESBL organisms, carbapenems are preferred over piperacillin/tazobactam. They are shown to have a lower risk of mortality.    Report Status 04/24/2022 FINAL  Final   Organism ID, Bacteria ESCHERICHIA COLI (A)  Final      Susceptibility   Escherichia coli - MIC*    AMPICILLIN >=32 RESISTANT Resistant     CEFAZOLIN >=64 RESISTANT Resistant     CEFEPIME 2 SENSITIVE Sensitive     CEFTRIAXONE 32 RESISTANT Resistant     CIPROFLOXACIN >=4 RESISTANT Resistant     GENTAMICIN >=16 RESISTANT Resistant     IMIPENEM <=0.25 SENSITIVE Sensitive     NITROFURANTOIN <=16 SENSITIVE Sensitive     TRIMETH/SULFA >=320 RESISTANT Resistant     AMPICILLIN/SULBACTAM 16 INTERMEDIATE Intermediate     PIP/TAZO <=4 SENSITIVE Sensitive     * >=100,000 COLONIES/mL ESCHERICHIA COLI    Labs: CBC: Recent Labs  Lab 04/22/22 1746 04/23/22 0418 04/24/22 0416 04/25/22 0351 04/26/22 0408 04/27/22 0448  WBC 26.9* 24.0* 16.5* 12.8* 15.6* 11.7*  NEUTROABS 26.1*  --   --   --   --   --   HGB 10.2* 10.2* 10.5* 10.6* 11.1* 10.3*  HCT 29.0* 29.4* 30.8* 30.9* 32.9* 30.4*  MCV 93.2 93.6 94.8 94.5 96.8 97.4  PLT 126* 102* 90* 77* 58* 46*   Basic Metabolic Panel: Recent Labs  Lab 04/23/22 0418 04/24/22 0416 04/25/22 0351 04/26/22 0408 04/27/22 0448  NA 138 137 133* 135 136  K 4.6 4.3 3.8 4.0 3.8  CL 105 103 100 100 102  CO2 '24 27 27 30 27  '$ GLUCOSE 127* 99 136* 114* 141*  BUN 34* 28* 27* 22 19  CREATININE 1.03 1.01 1.15 1.09 1.05  CALCIUM 8.3* 8.4* 8.5* 8.5* 8.3*   Liver Function Tests: Recent Labs  Lab 04/22/22 1746 04/23/22 0418  AST 29 25  ALT 57* 54*  ALKPHOS 60 59   BILITOT 1.0 1.0  PROT 5.5* 5.4*  ALBUMIN 2.7* 2.8*   CBG: No results for input(s): "GLUCAP" in the last 168 hours.  Discharge time spent: greater than 30 minutes.  Signed: Elmarie Shiley, MD [facility] 04/27/2022

## 2022-04-27 NOTE — Discharge Instructions (Signed)
Stop taking xarelto, platelet counts are low. Dr Irene Limbo will repeat lab work next week and will advised you when to resume Xarelto.   Your BP medications were changes due to orthostatic hypotension. Your Cardizem dose was reduce, Imdur and hydrochlorothiazide discontinue for now.

## 2022-04-29 DIAGNOSIS — J449 Chronic obstructive pulmonary disease, unspecified: Secondary | ICD-10-CM | POA: Diagnosis not present

## 2022-04-29 DIAGNOSIS — I2510 Atherosclerotic heart disease of native coronary artery without angina pectoris: Secondary | ICD-10-CM | POA: Diagnosis not present

## 2022-04-29 DIAGNOSIS — N183 Chronic kidney disease, stage 3 unspecified: Secondary | ICD-10-CM | POA: Diagnosis not present

## 2022-04-29 DIAGNOSIS — D696 Thrombocytopenia, unspecified: Secondary | ICD-10-CM

## 2022-04-29 DIAGNOSIS — C8330 Diffuse large B-cell lymphoma, unspecified site: Secondary | ICD-10-CM | POA: Diagnosis not present

## 2022-04-29 DIAGNOSIS — G4733 Obstructive sleep apnea (adult) (pediatric): Secondary | ICD-10-CM

## 2022-04-29 DIAGNOSIS — Z7901 Long term (current) use of anticoagulants: Secondary | ICD-10-CM

## 2022-04-29 DIAGNOSIS — Z9842 Cataract extraction status, left eye: Secondary | ICD-10-CM

## 2022-04-29 DIAGNOSIS — Z7951 Long term (current) use of inhaled steroids: Secondary | ICD-10-CM

## 2022-04-29 DIAGNOSIS — E785 Hyperlipidemia, unspecified: Secondary | ICD-10-CM

## 2022-04-29 DIAGNOSIS — F341 Dysthymic disorder: Secondary | ICD-10-CM

## 2022-04-29 DIAGNOSIS — I480 Paroxysmal atrial fibrillation: Secondary | ICD-10-CM | POA: Diagnosis not present

## 2022-04-29 DIAGNOSIS — N179 Acute kidney failure, unspecified: Secondary | ICD-10-CM | POA: Diagnosis not present

## 2022-04-29 DIAGNOSIS — N401 Enlarged prostate with lower urinary tract symptoms: Secondary | ICD-10-CM | POA: Diagnosis not present

## 2022-04-29 DIAGNOSIS — Z955 Presence of coronary angioplasty implant and graft: Secondary | ICD-10-CM

## 2022-04-29 DIAGNOSIS — K219 Gastro-esophageal reflux disease without esophagitis: Secondary | ICD-10-CM

## 2022-04-29 DIAGNOSIS — E039 Hypothyroidism, unspecified: Secondary | ICD-10-CM

## 2022-04-29 DIAGNOSIS — Z9181 History of falling: Secondary | ICD-10-CM

## 2022-04-29 DIAGNOSIS — F411 Generalized anxiety disorder: Secondary | ICD-10-CM

## 2022-04-29 DIAGNOSIS — Z9049 Acquired absence of other specified parts of digestive tract: Secondary | ICD-10-CM

## 2022-04-29 DIAGNOSIS — N139 Obstructive and reflux uropathy, unspecified: Secondary | ICD-10-CM

## 2022-04-29 DIAGNOSIS — I130 Hypertensive heart and chronic kidney disease with heart failure and stage 1 through stage 4 chronic kidney disease, or unspecified chronic kidney disease: Secondary | ICD-10-CM | POA: Diagnosis not present

## 2022-04-29 DIAGNOSIS — M1990 Unspecified osteoarthritis, unspecified site: Secondary | ICD-10-CM

## 2022-04-29 DIAGNOSIS — Z96641 Presence of right artificial hip joint: Secondary | ICD-10-CM

## 2022-04-29 DIAGNOSIS — E7800 Pure hypercholesterolemia, unspecified: Secondary | ICD-10-CM

## 2022-04-29 DIAGNOSIS — Z9841 Cataract extraction status, right eye: Secondary | ICD-10-CM

## 2022-04-29 DIAGNOSIS — M6389 Disorders of muscle in diseases classified elsewhere, multiple sites: Secondary | ICD-10-CM

## 2022-04-29 DIAGNOSIS — N390 Urinary tract infection, site not specified: Secondary | ICD-10-CM

## 2022-04-29 DIAGNOSIS — I5022 Chronic systolic (congestive) heart failure: Secondary | ICD-10-CM | POA: Diagnosis not present

## 2022-04-29 DIAGNOSIS — Z87442 Personal history of urinary calculi: Secondary | ICD-10-CM

## 2022-05-03 ENCOUNTER — Other Ambulatory Visit: Payer: Self-pay

## 2022-05-03 ENCOUNTER — Encounter: Payer: Self-pay | Admitting: Hematology

## 2022-05-03 DIAGNOSIS — C8330 Diffuse large B-cell lymphoma, unspecified site: Secondary | ICD-10-CM

## 2022-05-03 DIAGNOSIS — N390 Urinary tract infection, site not specified: Secondary | ICD-10-CM

## 2022-05-03 DIAGNOSIS — J3081 Allergic rhinitis due to animal (cat) (dog) hair and dander: Secondary | ICD-10-CM | POA: Diagnosis not present

## 2022-05-03 DIAGNOSIS — J301 Allergic rhinitis due to pollen: Secondary | ICD-10-CM | POA: Diagnosis not present

## 2022-05-03 DIAGNOSIS — J3089 Other allergic rhinitis: Secondary | ICD-10-CM | POA: Diagnosis not present

## 2022-05-04 ENCOUNTER — Inpatient Hospital Stay: Payer: Medicare PPO

## 2022-05-04 ENCOUNTER — Inpatient Hospital Stay (HOSPITAL_BASED_OUTPATIENT_CLINIC_OR_DEPARTMENT_OTHER): Payer: Medicare PPO | Admitting: Hematology

## 2022-05-04 ENCOUNTER — Other Ambulatory Visit: Payer: Self-pay

## 2022-05-04 ENCOUNTER — Other Ambulatory Visit: Payer: Self-pay | Admitting: Hematology

## 2022-05-04 VITALS — BP 100/58 | HR 75 | Temp 97.5°F | Resp 18 | Wt 258.0 lb

## 2022-05-04 DIAGNOSIS — N1831 Chronic kidney disease, stage 3a: Secondary | ICD-10-CM | POA: Diagnosis not present

## 2022-05-04 DIAGNOSIS — C8330 Diffuse large B-cell lymphoma, unspecified site: Secondary | ICD-10-CM | POA: Diagnosis not present

## 2022-05-04 DIAGNOSIS — Z5189 Encounter for other specified aftercare: Secondary | ICD-10-CM | POA: Diagnosis not present

## 2022-05-04 DIAGNOSIS — N390 Urinary tract infection, site not specified: Secondary | ICD-10-CM

## 2022-05-04 DIAGNOSIS — C8339 Diffuse large B-cell lymphoma, extranodal and solid organ sites: Secondary | ICD-10-CM | POA: Diagnosis not present

## 2022-05-04 DIAGNOSIS — Z5111 Encounter for antineoplastic chemotherapy: Secondary | ICD-10-CM | POA: Diagnosis not present

## 2022-05-04 DIAGNOSIS — Z7189 Other specified counseling: Secondary | ICD-10-CM

## 2022-05-04 DIAGNOSIS — R4182 Altered mental status, unspecified: Secondary | ICD-10-CM | POA: Diagnosis not present

## 2022-05-04 DIAGNOSIS — Z87440 Personal history of urinary (tract) infections: Secondary | ICD-10-CM | POA: Diagnosis not present

## 2022-05-04 DIAGNOSIS — Z5112 Encounter for antineoplastic immunotherapy: Secondary | ICD-10-CM | POA: Diagnosis not present

## 2022-05-04 DIAGNOSIS — R627 Adult failure to thrive: Secondary | ICD-10-CM | POA: Diagnosis not present

## 2022-05-04 LAB — CMP (CANCER CENTER ONLY)
ALT: 28 U/L (ref 0–44)
AST: 21 U/L (ref 15–41)
Albumin: 3.3 g/dL — ABNORMAL LOW (ref 3.5–5.0)
Alkaline Phosphatase: 82 U/L (ref 38–126)
Anion gap: 6 (ref 5–15)
BUN: 26 mg/dL — ABNORMAL HIGH (ref 8–23)
CO2: 28 mmol/L (ref 22–32)
Calcium: 8.6 mg/dL — ABNORMAL LOW (ref 8.9–10.3)
Chloride: 100 mmol/L (ref 98–111)
Creatinine: 1.12 mg/dL (ref 0.61–1.24)
GFR, Estimated: >60 mL/min (ref >60)
Glucose, Bld: 122 mg/dL — ABNORMAL HIGH (ref 70–99)
Potassium: 4.2 mmol/L (ref 3.5–5.1)
Sodium: 134 mmol/L — ABNORMAL LOW (ref 135–145)
Total Bilirubin: 0.8 mg/dL (ref 0.3–1.2)
Total Protein: 5.6 g/dL — ABNORMAL LOW (ref 6.5–8.1)

## 2022-05-04 LAB — URINALYSIS, COMPLETE (UACMP) WITH MICROSCOPIC
Bilirubin Urine: NEGATIVE
Glucose, UA: NEGATIVE mg/dL
Hgb urine dipstick: NEGATIVE
Ketones, ur: NEGATIVE mg/dL
Nitrite: NEGATIVE
Protein, ur: NEGATIVE mg/dL
Specific Gravity, Urine: 1.016 (ref 1.005–1.030)
pH: 6 (ref 5.0–8.0)

## 2022-05-04 LAB — CBC WITH DIFFERENTIAL (CANCER CENTER ONLY)
Abs Immature Granulocytes: 1.4 10*3/uL — ABNORMAL HIGH (ref 0.00–0.07)
Basophils Absolute: 0.1 10*3/uL (ref 0.0–0.1)
Basophils Relative: 1 %
Eosinophils Absolute: 0.1 10*3/uL (ref 0.0–0.5)
Eosinophils Relative: 1 %
HCT: 33.6 % — ABNORMAL LOW (ref 39.0–52.0)
Hemoglobin: 11.4 g/dL — ABNORMAL LOW (ref 13.0–17.0)
Immature Granulocytes: 10 %
Lymphocytes Relative: 9 %
Lymphs Abs: 1.4 10*3/uL (ref 0.7–4.0)
MCH: 33.3 pg (ref 26.0–34.0)
MCHC: 33.9 g/dL (ref 30.0–36.0)
MCV: 98.2 fL (ref 80.0–100.0)
Monocytes Absolute: 0.8 10*3/uL (ref 0.1–1.0)
Monocytes Relative: 5 %
Neutro Abs: 10.9 10*3/uL — ABNORMAL HIGH (ref 1.7–7.7)
Neutrophils Relative %: 74 %
Platelet Count: 223 10*3/uL (ref 150–400)
RBC: 3.42 MIL/uL — ABNORMAL LOW (ref 4.22–5.81)
RDW: 22.4 % — ABNORMAL HIGH (ref 11.5–15.5)
WBC Count: 14.6 10*3/uL — ABNORMAL HIGH (ref 4.0–10.5)
nRBC: 0.1 % (ref 0.0–0.2)

## 2022-05-04 LAB — LACTATE DEHYDROGENASE: LDH: 319 U/L — ABNORMAL HIGH (ref 98–192)

## 2022-05-04 MED ORDER — SODIUM CHLORIDE 0.9% FLUSH
10.0000 mL | INTRAVENOUS | Status: DC | PRN
  Administered 2022-05-04: 10 mL

## 2022-05-04 MED ORDER — SODIUM CHLORIDE 0.9 % IV SOLN
462.4000 mg | Freq: Once | INTRAVENOUS | Status: AC
  Administered 2022-05-04: 460 mg via INTRAVENOUS
  Filled 2022-05-04: qty 46

## 2022-05-04 MED ORDER — SODIUM CHLORIDE 0.9 % IV SOLN: Freq: Once | INTRAVENOUS | Status: AC

## 2022-05-04 MED ORDER — DIPHENHYDRAMINE HCL 25 MG PO CAPS
50.0000 mg | ORAL_CAPSULE | Freq: Once | ORAL | Status: AC
  Administered 2022-05-04: 50 mg via ORAL
  Filled 2022-05-04: qty 2

## 2022-05-04 MED ORDER — HEPARIN SOD (PORK) LOCK FLUSH 100 UNIT/ML IV SOLN
500.0000 [IU] | Freq: Once | INTRAVENOUS | Status: AC | PRN
  Administered 2022-05-04: 500 [IU]

## 2022-05-04 MED ORDER — ACETAMINOPHEN 325 MG PO TABS
650.0000 mg | ORAL_TABLET | Freq: Once | ORAL | Status: AC
  Administered 2022-05-04: 650 mg via ORAL
  Filled 2022-05-04: qty 2

## 2022-05-04 MED ORDER — PALONOSETRON HCL INJECTION 0.25 MG/5ML
0.2500 mg | Freq: Once | INTRAVENOUS | Status: AC
  Administered 2022-05-04: 0.25 mg via INTRAVENOUS
  Filled 2022-05-04: qty 5

## 2022-05-04 MED ORDER — SODIUM CHLORIDE 0.9 % IV SOLN
800.0000 mg/m2 | Freq: Once | INTRAVENOUS | Status: AC
  Administered 2022-05-04: 1976 mg via INTRAVENOUS
  Filled 2022-05-04: qty 51.97

## 2022-05-04 MED ORDER — SODIUM CHLORIDE 0.9 % IV SOLN
20.0000 mg | Freq: Once | INTRAVENOUS | Status: AC
  Administered 2022-05-04: 20 mg via INTRAVENOUS
  Filled 2022-05-04: qty 20

## 2022-05-04 MED ORDER — SODIUM CHLORIDE 0.9 % IV SOLN
150.0000 mg | Freq: Once | INTRAVENOUS | Status: AC
  Administered 2022-05-04: 150 mg via INTRAVENOUS
  Filled 2022-05-04: qty 150

## 2022-05-04 MED ORDER — SODIUM CHLORIDE 0.9 % IV SOLN
375.0000 mg/m2 | Freq: Once | INTRAVENOUS | Status: AC
  Administered 2022-05-04: 900 mg via INTRAVENOUS
  Filled 2022-05-04: qty 50

## 2022-05-04 NOTE — Progress Notes (Signed)
Patient unable to urinate at this time.  Given specimen cup and instructions

## 2022-05-04 NOTE — Patient Instructions (Signed)
[facility] ONCOL[facility]  Discharge Instructions: Thank you for choosing [facility] to provide your oncology and hematology care.   If you have a lab appointment with the [facility], please go directly to the [facility] and check in at the registration area.   Wear comfortable clothing and clothing appropriate for easy access to any Portacath or PICC line.   We strive to give you quality time with your provider. You may need to reschedule your appointment if you arrive late (15 or more minutes).  Arriving late affects you and other patients whose appointments are after yours.  Also, if you miss three or more appointments without notifying the office, you may be dismissed from the clinic at the provider's discretion.      For prescription refill requests, have your pharmacy contact our office and allow 72 hours for refills to be completed.    Today you received the following chemotherapy and/or immunotherapy agents: Rituxan, Carboplatin, Gemzar      To help prevent nausea and vomiting after your treatment, we encourage you to take your nausea medication as directed.  BELOW ARE SYMPTOMS THAT SHOULD BE REPORTED IMMEDIATELY: *FEVER GREATER THAN 100.4 F (38 C) OR HIGHER *CHILLS OR SWEATING *NAUSEA AND VOMITING THAT IS NOT CONTROLLED WITH YOUR NAUSEA MEDICATION *UNUSUAL SHORTNESS OF BREATH *UNUSUAL BRUISING OR BLEEDING *URINARY PROBLEMS (pain or burning when urinating, or frequent urination) *BOWEL PROBLEMS (unusual diarrhea, constipation, pain near the anus) TENDERNESS IN MOUTH AND THROAT WITH OR WITHOUT PRESENCE OF ULCERS (sore throat, sores in mouth, or a toothache) UNUSUAL RASH, SWELLING OR PAIN  UNUSUAL VAGINAL DISCHARGE OR ITCHING   Items with * indicate a potential emergency and should be followed up as soon as possible or go to the Emergency Department if any problems should occur.  Please show the CHEMOTHERAPY ALERT CARD or IMMUNOTHERAPY ALERT  CARD at check-in to the Emergency Department and triage nurse.  Should you have questions after your visit or need to cancel or reschedule your appointment, please contact [facility]  Dept: [phone]  and follow the prompts.  Office hours are 8:00 a.m. to 4:30 p.m. Monday - Friday. Please note that voicemails left after 4:00 p.m. may not be returned until the following business day.  We are closed weekends and major holidays. You have access to a nurse at all times for urgent questions. Please call the main number to the clinic Dept: [phone] and follow the prompts.   For any non-urgent questions, you may also contact your provider using MyChart. We now offer e-Visits for anyone 65 and older to request care online for non-urgent symptoms. For details visit [email or web address].   Also download the MyChart app! Go to the app store, search "MyChart", open the app, select [facility], and log in with your MyChart username and password.  Masks are optional in the cancer centers. If you would like for your care team to wear a mask while they are taking care of you, please let them know. For doctor visits, patients may have with them one support person who is at least 77 years old. At this time, visitors are not allowed in the infusion area.

## 2022-05-06 ENCOUNTER — Other Ambulatory Visit: Payer: Self-pay | Admitting: Hematology

## 2022-05-06 ENCOUNTER — Other Ambulatory Visit: Payer: Self-pay

## 2022-05-06 DIAGNOSIS — C8330 Diffuse large B-cell lymphoma, unspecified site: Secondary | ICD-10-CM

## 2022-05-06 DIAGNOSIS — I739 Peripheral vascular disease, unspecified: Secondary | ICD-10-CM | POA: Diagnosis not present

## 2022-05-06 DIAGNOSIS — F410 Panic disorder [episodic paroxysmal anxiety] without agoraphobia: Secondary | ICD-10-CM | POA: Diagnosis not present

## 2022-05-06 DIAGNOSIS — F331 Major depressive disorder, recurrent, moderate: Secondary | ICD-10-CM | POA: Diagnosis not present

## 2022-05-06 DIAGNOSIS — B351 Tinea unguium: Secondary | ICD-10-CM | POA: Diagnosis not present

## 2022-05-06 MED ORDER — SODIUM CHLORIDE 0.9 % IV SOLN: 1.0000 g | Freq: Three times a day (TID) | INTRAVENOUS | 0 refills | Status: AC

## 2022-05-07 ENCOUNTER — Telehealth: Payer: Self-pay

## 2022-05-07 ENCOUNTER — Other Ambulatory Visit: Payer: Self-pay

## 2022-05-07 DIAGNOSIS — C8330 Diffuse large B-cell lymphoma, unspecified site: Secondary | ICD-10-CM

## 2022-05-07 NOTE — Telephone Encounter (Signed)
Nurse is calling for Verbal Orders for Skilled nursing for IV antibiotic management and UTI Prevention teaching.   1 time a week for 4 weeks.  Mrs. [name] [phone]

## 2022-05-10 ENCOUNTER — Inpatient Hospital Stay: Payer: Medicare PPO

## 2022-05-10 ENCOUNTER — Other Ambulatory Visit: Payer: Self-pay

## 2022-05-10 VITALS — BP 105/65 | HR 84 | Temp 97.9°F | Resp 18 | Wt 259.2 lb

## 2022-05-10 DIAGNOSIS — N1831 Chronic kidney disease, stage 3a: Secondary | ICD-10-CM | POA: Diagnosis not present

## 2022-05-10 DIAGNOSIS — Z5111 Encounter for antineoplastic chemotherapy: Secondary | ICD-10-CM | POA: Diagnosis not present

## 2022-05-10 DIAGNOSIS — C8330 Diffuse large B-cell lymphoma, unspecified site: Secondary | ICD-10-CM

## 2022-05-10 DIAGNOSIS — Z87440 Personal history of urinary (tract) infections: Secondary | ICD-10-CM | POA: Diagnosis not present

## 2022-05-10 DIAGNOSIS — C8339 Diffuse large B-cell lymphoma, extranodal and solid organ sites: Secondary | ICD-10-CM | POA: Diagnosis not present

## 2022-05-10 DIAGNOSIS — R4182 Altered mental status, unspecified: Secondary | ICD-10-CM | POA: Diagnosis not present

## 2022-05-10 DIAGNOSIS — Z95828 Presence of other vascular implants and grafts: Secondary | ICD-10-CM

## 2022-05-10 DIAGNOSIS — R627 Adult failure to thrive: Secondary | ICD-10-CM | POA: Diagnosis not present

## 2022-05-10 DIAGNOSIS — Z7189 Other specified counseling: Secondary | ICD-10-CM

## 2022-05-10 DIAGNOSIS — Z5189 Encounter for other specified aftercare: Secondary | ICD-10-CM | POA: Diagnosis not present

## 2022-05-10 DIAGNOSIS — Z5112 Encounter for antineoplastic immunotherapy: Secondary | ICD-10-CM | POA: Diagnosis not present

## 2022-05-10 LAB — CMP (CANCER CENTER ONLY)
ALT: 60 U/L — ABNORMAL HIGH (ref 0–44)
AST: 28 U/L (ref 15–41)
Albumin: 3.2 g/dL — ABNORMAL LOW (ref 3.5–5.0)
Alkaline Phosphatase: 55 U/L (ref 38–126)
Anion gap: 3 — ABNORMAL LOW (ref 5–15)
BUN: 31 mg/dL — ABNORMAL HIGH (ref 8–23)
CO2: 31 mmol/L (ref 22–32)
Calcium: 8.3 mg/dL — ABNORMAL LOW (ref 8.9–10.3)
Chloride: 97 mmol/L — ABNORMAL LOW (ref 98–111)
Creatinine: 0.98 mg/dL (ref 0.61–1.24)
GFR, Estimated: >60 mL/min (ref >60)
Glucose, Bld: 120 mg/dL — ABNORMAL HIGH (ref 70–99)
Potassium: 4.2 mmol/L (ref 3.5–5.1)
Sodium: 131 mmol/L — ABNORMAL LOW (ref 135–145)
Total Bilirubin: 1 mg/dL (ref 0.3–1.2)
Total Protein: 5.4 g/dL — ABNORMAL LOW (ref 6.5–8.1)

## 2022-05-10 LAB — CBC WITH DIFFERENTIAL (CANCER CENTER ONLY)
Abs Immature Granulocytes: 0.05 10*3/uL (ref 0.00–0.07)
Basophils Absolute: 0 10*3/uL (ref 0.0–0.1)
Basophils Relative: 0 %
Eosinophils Absolute: 0 10*3/uL (ref 0.0–0.5)
Eosinophils Relative: 0 %
HCT: 32 % — ABNORMAL LOW (ref 39.0–52.0)
Hemoglobin: 11.1 g/dL — ABNORMAL LOW (ref 13.0–17.0)
Immature Granulocytes: 1 %
Lymphocytes Relative: 8 %
Lymphs Abs: 0.3 10*3/uL — ABNORMAL LOW (ref 0.7–4.0)
MCH: 33.6 pg (ref 26.0–34.0)
MCHC: 34.7 g/dL (ref 30.0–36.0)
MCV: 97 fL (ref 80.0–100.0)
Monocytes Absolute: 0.1 10*3/uL (ref 0.1–1.0)
Monocytes Relative: 2 %
Neutro Abs: 3.3 10*3/uL (ref 1.7–7.7)
Neutrophils Relative %: 89 %
Platelet Count: 237 10*3/uL (ref 150–400)
RBC: 3.3 MIL/uL — ABNORMAL LOW (ref 4.22–5.81)
RDW: 21.6 % — ABNORMAL HIGH (ref 11.5–15.5)
WBC Count: 3.8 10*3/uL — ABNORMAL LOW (ref 4.0–10.5)
nRBC: 0 % (ref 0.0–0.2)

## 2022-05-10 MED ORDER — SODIUM CHLORIDE 0.9 % IV SOLN: Freq: Once | INTRAVENOUS | Status: AC

## 2022-05-10 MED ORDER — HEPARIN SOD (PORK) LOCK FLUSH 100 UNIT/ML IV SOLN
500.0000 [IU] | Freq: Once | INTRAVENOUS | Status: AC | PRN
  Administered 2022-05-10: 500 [IU]

## 2022-05-10 MED ORDER — SODIUM CHLORIDE 0.9% FLUSH
10.0000 mL | Freq: Once | INTRAVENOUS | Status: AC
  Administered 2022-05-10: 10 mL

## 2022-05-10 MED ORDER — SODIUM CHLORIDE 0.9 % IV SOLN
800.0000 mg/m2 | Freq: Once | INTRAVENOUS | Status: AC
  Administered 2022-05-10: 1976 mg via INTRAVENOUS
  Filled 2022-05-10: qty 51.97

## 2022-05-10 MED ORDER — SODIUM CHLORIDE 0.9% FLUSH
10.0000 mL | INTRAVENOUS | Status: DC | PRN
  Administered 2022-05-10: 10 mL

## 2022-05-10 MED ORDER — PROCHLORPERAZINE MALEATE 10 MG PO TABS
10.0000 mg | ORAL_TABLET | Freq: Once | ORAL | Status: AC
  Administered 2022-05-10: 10 mg via ORAL
  Filled 2022-05-10: qty 1

## 2022-05-10 NOTE — Progress Notes (Signed)
Pt came into infusion with PAC accessed. Pt does not remember the last time the home health nurse changed the dressing. Called [name], spouse. Per [name] home health nurse accessed and changed the dressing 05/07/2022 and is coming back to the house 05/14/2022 for a dressing change. Pt is having home infusions and requires to stay accessed. Dressing is C/D/I with antimicrobial gel in place.

## 2022-05-10 NOTE — Patient Instructions (Signed)
[facility]  Discharge Instructions: Thank you for choosing [facility] to provide your oncology and hematology care.   If you have a lab appointment with the Cancer Center, please go directly to the Cancer Center and check in at the registration area.   Wear comfortable clothing and clothing appropriate for easy access to any Portacath or PICC line.   We strive to give you quality time with your provider. You may need to reschedule your appointment if you arrive late (15 or more minutes).  Arriving late affects you and other patients whose appointments are after yours.  Also, if you miss three or more appointments without notifying the office, you may be dismissed from the clinic at the provider's discretion.      For prescription refill requests, have your pharmacy contact our office and allow 72 hours for refills to be completed.    Today you received the following chemotherapy and/or immunotherapy agents: Gemzar      To help prevent nausea and vomiting after your treatment, we encourage you to take your nausea medication as directed.  BELOW ARE SYMPTOMS THAT SHOULD BE REPORTED IMMEDIATELY: *FEVER GREATER THAN 100.4 F (38 C) OR HIGHER *CHILLS OR SWEATING *NAUSEA AND VOMITING THAT IS NOT CONTROLLED WITH YOUR NAUSEA MEDICATION *UNUSUAL SHORTNESS OF BREATH *UNUSUAL BRUISING OR BLEEDING *URINARY PROBLEMS (pain or burning when urinating, or frequent urination) *BOWEL PROBLEMS (unusual diarrhea, constipation, pain near the anus) TENDERNESS IN MOUTH AND THROAT WITH OR WITHOUT PRESENCE OF ULCERS (sore throat, sores in mouth, or a toothache) UNUSUAL RASH, SWELLING OR PAIN  UNUSUAL VAGINAL DISCHARGE OR ITCHING   Items with * indicate a potential emergency and should be followed up as soon as possible or go to the Emergency Department if any problems should occur.  Please show the CHEMOTHERAPY ALERT CARD or IMMUNOTHERAPY ALERT CARD at check-in to the  Emergency Department and triage nurse.  Should you have questions after your visit or need to cancel or reschedule your appointment, please contact [facility]  Dept: [phone]  and follow the prompts.  Office hours are 8:00 a.m. to 4:30 p.m. Monday - Friday. Please note that voicemails left after 4:00 p.m. may not be returned until the following business day.  We are closed weekends and major holidays. You have access to a nurse at all times for urgent questions. Please call the main number to the clinic Dept: [phone] and follow the prompts.   For any non-urgent questions, you may also contact your provider using MyChart. We now offer e-Visits for anyone 18 and older to request care online for non-urgent symptoms. For details visit [email or web address].   Also download the MyChart app! Go to the app store, search "MyChart", open the app, select [facility], and log in with your MyChart username and password.  Masks are optional in the cancer centers. If you would like for your care team to wear a mask while they are taking care of you, please let them know. For doctor visits, patients may have with them one support person who is at least 77 years old. At this time, visitors are not allowed in the infusion area. 

## 2022-05-10 NOTE — Progress Notes (Signed)
Patient came with port accessed by home health nurse. Patient reports he had port accessed at home for fluids and infusion at home. Port site is clean and dry. Flushes with ease and blood return noted.

## 2022-05-11 ENCOUNTER — Telehealth: Payer: Self-pay | Admitting: Internal Medicine

## 2022-05-11 ENCOUNTER — Encounter: Payer: Self-pay | Admitting: Hematology

## 2022-05-11 ENCOUNTER — Other Ambulatory Visit: Payer: Self-pay

## 2022-05-11 ENCOUNTER — Inpatient Hospital Stay: Payer: Medicare PPO | Attending: Hematology

## 2022-05-11 VITALS — BP 116/61 | HR 86 | Temp 97.8°F | Resp 18

## 2022-05-11 DIAGNOSIS — R4182 Altered mental status, unspecified: Secondary | ICD-10-CM | POA: Diagnosis not present

## 2022-05-11 DIAGNOSIS — C8339 Diffuse large B-cell lymphoma, extranodal and solid organ sites: Secondary | ICD-10-CM | POA: Insufficient documentation

## 2022-05-11 DIAGNOSIS — E860 Dehydration: Secondary | ICD-10-CM | POA: Diagnosis not present

## 2022-05-11 DIAGNOSIS — B9620 Unspecified Escherichia coli [E. coli] as the cause of diseases classified elsewhere: Secondary | ICD-10-CM | POA: Insufficient documentation

## 2022-05-11 DIAGNOSIS — Z5189 Encounter for other specified aftercare: Secondary | ICD-10-CM | POA: Diagnosis not present

## 2022-05-11 DIAGNOSIS — N189 Chronic kidney disease, unspecified: Secondary | ICD-10-CM | POA: Diagnosis not present

## 2022-05-11 DIAGNOSIS — Z7189 Other specified counseling: Secondary | ICD-10-CM

## 2022-05-11 DIAGNOSIS — R627 Adult failure to thrive: Secondary | ICD-10-CM | POA: Diagnosis not present

## 2022-05-11 DIAGNOSIS — Z87440 Personal history of urinary (tract) infections: Secondary | ICD-10-CM | POA: Diagnosis not present

## 2022-05-11 DIAGNOSIS — C8330 Diffuse large B-cell lymphoma, unspecified site: Secondary | ICD-10-CM

## 2022-05-11 MED ORDER — PEGFILGRASTIM-CBQV 6 MG/0.6ML ~~LOC~~ SOSY
6.0000 mg | PREFILLED_SYRINGE | Freq: Once | SUBCUTANEOUS | Status: AC
  Administered 2022-05-11: 6 mg via SUBCUTANEOUS
  Filled 2022-05-11: qty 0.6

## 2022-05-11 NOTE — Patient Instructions (Signed)
Pegfilgrastim Injection ?What is this medication? ?PEGFILGRASTIM (PEG fil gra stim) lowers the risk of infection in people who are receiving chemotherapy. It works by helping your body make more white blood cells, which protects your body from infection. It may also be used to help people who have been exposed to high doses of radiation. ?This medicine may be used for other purposes; ask your health care provider or pharmacist if you have questions. ?COMMON BRAND NAME(S): Fulphila, Fylnetra, Neulasta, Nyvepria, Stimufend, UDENYCA, Ziextenzo ?What should I tell my care team before I take this medication? ?They need to know if you have any of these conditions: ?Kidney disease ?Latex allergy ?Ongoing radiation therapy ?Sickle cell disease ?Skin reactions to acrylic adhesives (On-Body Injector only) ?An unusual or allergic reaction to pegfilgrastim, filgrastim, other medications, foods, dyes, or preservatives ?Pregnant or trying to get pregnant ?Breast-feeding ?How should I use this medication? ?This medication is for injection under the skin. If you get this medication at home, you will be taught how to prepare and give the pre-filled syringe or how to use the On-body Injector. Refer to the patient Instructions for Use for detailed instructions. Use exactly as directed. Tell your care team immediately if you suspect that the On-body Injector may not have performed as intended or if you suspect the use of the On-body Injector resulted in a missed or partial dose. ?It is important that you put your used needles and syringes in a special sharps container. Do not put them in a trash can. If you do not have a sharps container, call your pharmacist or care team to get one. ?Talk to your care team about the use of this medication in children. While this medication may be prescribed for selected conditions, precautions do apply. ?Overdosage: If you think you have taken too much of this medicine contact a poison control center  or emergency room at once. ?NOTE: This medicine is only for you. Do not share this medicine with others. ?What if I miss a dose? ?It is important not to miss your dose. Call your care team if you miss your dose. If you miss a dose due to an On-body Injector failure or leakage, a new dose should be administered as soon as possible using a single prefilled syringe for manual use. ?What may interact with this medication? ?Interactions have not been studied. ?This list may not describe all possible interactions. Give your health care provider a list of all the medicines, herbs, non-prescription drugs, or dietary supplements you use. Also tell them if you smoke, drink alcohol, or use illegal drugs. Some items may interact with your medicine. ?What should I watch for while using this medication? ?Your condition will be monitored carefully while you are receiving this medication. ?You may need blood work done while you are taking this medication. ?Talk to your care team about your risk of cancer. You may be more at risk for certain types of cancer if you take this medication. ?If you are going to need a MRI, CT scan, or other procedure, tell your care team that you are using this medication (On-Body Injector only). ?What side effects may I notice from receiving this medication? ?Side effects that you should report to your care team as soon as possible: ?Allergic reactions--skin rash, itching, hives, swelling of the face, lips, tongue, or throat ?Capillary leak syndrome--stomach or muscle pain, unusual weakness or fatigue, feeling faint or lightheaded, decrease in the amount of urine, swelling of the ankles, hands, or feet, trouble   breathing ?High white blood cell level--fever, fatigue, trouble breathing, night sweats, change in vision, weight loss ?Inflammation of the aorta--fever, fatigue, back, chest, or stomach pain, severe headache ?Kidney injury (glomerulonephritis)--decrease in the amount of urine, red or dark brown  urine, foamy or bubbly urine, swelling of the ankles, hands, or feet ?Shortness of breath or trouble breathing ?Spleen injury--pain in upper left stomach or shoulder ?Unusual bruising or bleeding ?Side effects that usually do not require medical attention (report to your care team if they continue or are bothersome): ?Bone pain ?Pain in the hands or feet ?This list may not describe all possible side effects. Call your doctor for medical advice about side effects. You may report side effects to FDA at 1-800-FDA-1088. ?Where should I keep my medication? ?Keep out of the reach of children. ?If you are using this medication at home, you will be instructed on how to store it. Throw away any unused medication after the expiration date on the label. ?NOTE: This sheet is a summary. It may not cover all possible information. If you have questions about this medicine, talk to your doctor, pharmacist, or health care provider. ?? 2023 Elsevier/Gold Standard (2021-08-28 00:00:00) ? ?

## 2022-05-11 NOTE — Telephone Encounter (Signed)
[name] is requesting the following orders:   PT twice a week for 3 weeks PT once  a week for 3 weeks

## 2022-05-11 NOTE — Progress Notes (Signed)
HEMATOLOGY/ONCOLOGY CLINIC NOTE  Date of Service: 05/04/2022    Patient Care Team: Cassandria Anger, MD as PCP - General (Internal Medicine) Skeet Latch, MD as PCP - Cardiology (Cardiology) Brunetta Genera, MD as Consulting Physician (Hematology) McKenzie, Candee Furbish, MD as Consulting Physician (Urology) Jackquline Denmark, MD as Consulting Physician (Gastroenterology) Skeet Latch, MD as Attending Physician (Cardiology) Lynnell Dike, [place] as Consulting Physician (Optometry) Szabat, Darnelle Maffucci, [facility] (Inactive) as Pharmacist (Pharmacist) Harriett Sine, MD as Consulting Physician (Dermatology)  CHIEF COMPLAINTS/PURPOSE OF CONSULTATION:  Follow-up for continued evaluation and management of relapsed large B-cell lymphoma  HISTORY OF PRESENTING ILLNESS:  Please see previous note for details on initial presentation.   INTERVAL HISTORY   [name] is 76 y.o. male is here for his third cycle of R GCD chemotherapy for relapsed large B-cell lymphoma.  He notes that his gluteal mass has nearly resolved.  No shortness of breath or chest pain.  He was admitted to the hospital again for recurrent UTI with ESBL E. coli.  He had 2 to 3 days of meropenem and was discharged with nitrofurantoin.  He notes that he is having recurrent dysuria and is concerned that his UTI might be coming back.  He does have a follow-up with urology to evaluate the etiology of his recurrent UTIs.  He was also referred to infectious disease to develop strategies to prevent recurrent UTIs.  We set him up for additional IV meropenem to complete at least a total of a 10-day course given his history of urinary sepsis and persistent symptomatology.  This will be set up through home care services. We discussed that after this current chemoimmunotherapy cycle we would want to repeat a PET scan to evaluate for treatment response and also referred him to [facility] for consideration of further  consolidative treatments..  Labs done today were reviewed with him in detail.[name]  MEDICAL HISTORY:  Past Medical History:  Diagnosis Date   Anticoagulant long-term use    xarelto--- takes for AFib,  managed by pcp, Dr Alain Marion   Benign localized prostatic hyperplasia with lower urinary tract symptoms (LUTS)    CAD (coronary artery disease) cardiologist--  dr t. Oval Linsey   hx of known CAD obstruction w/ collaterals (cath done @ [facility] 12-31-2011) ;   last cardiac cath 08-13-2019  showed sig. 2V CAD involving proxLAD and CTO of the RCA/  chronic total occlusion midRCA w/ bridging and L>R collaterals   Diastolic CHF ([place])    dx [id number] hospital admission (followed by pcp)   Diffuse large B cell lymphoma [facility]) oncologist-- dr Irene Limbo--- in remission   dx 08/ 2019 -- bx left tonsill mass-- involving lymph nodes--- completed chemotherapy 10-12-2018   DJD (degenerative joint disease)    Dyspnea    Dysthymic disorder    Elevated brain natriuretic peptide (BNP) level    Environmental allergies    GAD (generalized anxiety disorder)    GERD (gastroesophageal reflux disease)    History of falling    multiple times   History of Graves' disease    1987 s/p total thyroidectomy   History of kidney stones    History of scabies    07/ 2014  resolved   History of syncope    per D/C note in epic 02-21-2013 ?vasovagal   History of vertebral compression fracture    05/ 2014 --  L1, no surgical intervention   HLD (hyperlipidemia)    Hyperlipidemia    Hypertension  Hypogonadism in male    IDA (iron deficiency anemia)    Lumbago    OA (osteoarthritis)    OSA on CPAP    cpap machine-settings 17   Osteomyelitis of right wrist ([place]) 11/02/2012   Osteoporosis    Severe   Osteoporosis with fracture 04/24/2013   Pathological fracture due to osteoporosis with delayed healing 04/24/2013   Persistent atrial fibrillation [facility])    cardiologist--- dr Alfonso Patten. [place]   Plaque psoriasis    dermatologist--- dr  Harriett Sine--- currently taking otezla   Post-surgical hypothyroidism    followed by pcp---  s/p total thyroidectomy for graves disease in 1987   Pure hypercholesterolemia 02/10/2021   Renal calculus, right    Right femoral fracture ([place]) 09/29/2012   Right forearm fracture 09/29/2012   Right tibial fracture 02/05/2013   Thrombocytopenia (HCC)    Unsteady gait    uses walker   Wears hearing aid in both ears     SURGICAL HISTORY: Past Surgical History:  Procedure Laterality Date   APPENDECTOMY  2011 approx.    CARDIAC CATHETERIZATION  12-31-2011   dr j. Beatrix Fetters '@[facility]'$    normal LVF w/ multivessel CAD,  occluded RCA w/ colleterals   CATARACT EXTRACTION W/ INTRAOCULAR LENS  IMPLANT, BILATERAL  2016 approx.   COLONOSCOPY     CYSTOSCOPY WITH RETROGRADE PYELOGRAM, URETEROSCOPY AND STENT PLACEMENT Left 11/07/2017   Procedure: CYSTOSCOPY WITH RETROGRADE PYELOGRAM, URETEROSCOPY AND STENT PLACEMENT;  Surgeon: Cleon Gustin, MD;  Location[facility];  Service: Urology;  Laterality: Left;   CYSTOSCOPY WITH RETROGRADE PYELOGRAM, URETEROSCOPY AND STENT PLACEMENT Right 10/22/2019   Procedure: CYSTOSCOPY WITH RETROGRADE PYELOGRAM, URETEROSCOPY AND STENT PLACEMENT;  Surgeon: Cleon Gustin, MD;  Location: [facility];  Service: Urology;  Laterality: Right;   CYSTOSCOPY/URETEROSCOPY/HOLMIUM LASER/STENT PLACEMENT Right 03/11/2017   Procedure: CYSTOSCOPY/URETEROSCOPYSTENT PLACEMENT right ureter retrograde pylegram;  Surgeon: Cleon Gustin, MD;  [facility];  Service: Urology;  Laterality: Right;   HARDWARE REMOVAL  10/05/2012   Procedure: HARDWARE REMOVAL;  Surgeon: [name], MD;  [facility];  Service: Orthopedics;  Laterality: Right;  REMOVING  STRYKER  GAMMA NAIL   HARDWARE REMOVAL Right 07/03/2013   Procedure: HARDWARE REMOVAL RIGHT TIBIA ;  Surgeon: Rozanna Box, MD;  Location: [place];  Service: Orthopedics;  Laterality: Right;   HIP CLOSED REDUCTION Right  10/15/2013   Procedure: CLOSED MANIPULATION HIP;  Surgeon: [name], MD;  [facility];  Service: Orthopedics;  Laterality: Right;   HOLMIUM LASER APPLICATION Right 06/12/6383   Procedure: HOLMIUM LASER APPLICATION;  Surgeon: Cleon Gustin, MD;  [facility];  Service: Urology;  Laterality: Right;   HOLMIUM LASER APPLICATION Left 6/65/9935   Procedure: HOLMIUM LASER APPLICATION;  Surgeon: Cleon Gustin, MD;  [facility];  Service: Urology;  Laterality: Left;   HOLMIUM LASER APPLICATION Left 7/0/1779   Procedure: HOLMIUM LASER APPLICATION;  Surgeon: Cleon Gustin, MD;  [facility];  Service: Urology;  Laterality: Left;   HOLMIUM LASER APPLICATION Right 3/90/3009   Procedure: HOLMIUM LASER APPLICATION;  Surgeon: Cleon Gustin, MD;  Location: [facility];  Service: Urology;  Laterality: Right;   INCISION AND DRAINAGE HIP Right 11/16/2013   Procedure: IRRIGATION AND DEBRIDEMENT RIGHT HIP;  Surgeon: [name], MD;  [facility];  Service: Orthopedics;  Laterality: Right;   INTRAVASCULAR PRESSURE WIRE/FFR STUDY N/A 08/13/2019   Procedure: INTRAVASCULAR PRESSURE WIRE/FFR STUDY;  Surgeon: Nelva Bush, MD;  Location: [place] [facility];  Service: Cardiovascular;  Laterality: N/A;   IR IMAGING GUIDED PORT INSERTION  06/22/2018   IR NEPHROSTOMY PLACEMENT LEFT  03/28/2019   IR URETERAL STENT LEFT NEW ACCESS W/O SEP NEPHROSTOMY CATH  10/24/2017   IR URETERAL STENT LEFT NEW ACCESS W/O SEP NEPHROSTOMY CATH  03/26/2019   NEPHROLITHOTOMY Right 02/08/2017   Procedure: NEPHROLITHOTOMY PERCUTANEOUS WITH SURGEON ACCESS;  Surgeon: Cleon Gustin, MD;  [facility];  Service: Urology;  Laterality: Right;   NEPHROLITHOTOMY Left 10/24/2017   Procedure: NEPHROLITHOTOMY PERCUTANEOUS;  Surgeon: Cleon Gustin, MD;  [facility];  Service: Urology;  Laterality: Left;   NEPHROLITHOTOMY Left 03/26/2019   Procedure: NEPHROLITHOTOMY PERCUTANEOUS;   Surgeon: Cleon Gustin, MD;  [facility];  Service: Urology;  Laterality: Left;  2 HRS   NEPHROLITHOTOMY Left 04/17/2019   Procedure: NEPHROLITHOTOMY PERCUTANEOUS;  Surgeon: Cleon Gustin, MD;  [facility];  Service: Urology;  Laterality: Left;  2 HRS   ORIF TIBIA FRACTURE Right 02/06/2013   Procedure: OPEN REDUCTION INTERNAL FIXATION (ORIF) TIBIA FRACTURE WITH IM ROD FIBULA;  Surgeon: Rozanna Box, MD;  Location: [place];  Service: Orthopedics;  Laterality: Right;   ORIF TIBIA FRACTURE Right 07/03/2013   Procedure: RIGHT TIBIA NON UNION REPAIR ;  Surgeon: Rozanna Box, MD;  Location: [place];  Service: Orthopedics;  Laterality: Right;   ORIF WRIST FRACTURE  10/02/2012   Procedure: OPEN REDUCTION INTERNAL FIXATION (ORIF) WRIST FRACTURE;  Surgeon: Roseanne Kaufman, MD;  [facility];  Service: Orthopedics;  Laterality: Right;  WITH   ANTIBIOTIC  CEMENT   ORIF WRIST FRACTURE Left 10/28/2013   Procedure: OPEN REDUCTION INTERNAL FIXATION (ORIF) WRIST FRACTURE with allograft;  Surgeon: Roseanne Kaufman, MD;  [facility];  Service: Orthopedics;  Laterality: Left;  DVR Plate   QUADRICEPS TENDON REPAIR Left 07/15/2017   Procedure: REPAIR QUADRICEP TENDON;  Surgeon: Frederik Pear, MD;  Location: [place];  Service: Orthopedics;  Laterality: Left;   RIGHT/LEFT HEART CATH AND CORONARY ANGIOGRAPHY N/A 08/13/2019   Procedure: RIGHT/LEFT HEART CATH AND CORONARY ANGIOGRAPHY;  Surgeon: Nelva Bush, MD;  Location: [place] [facility];  Service: Cardiovascular;  Laterality: N/A;   THYROIDECTOMY  02/1986   TOTAL HIP ARTHROPLASTY Right 03-16-2016   '@[facility]'$    TOTAL HIP REVISION  10/05/2012   Procedure: TOTAL HIP REVISION;  Surgeon: [name], MD;  [facility];  Service: Orthopedics;  Laterality: Right;  RIGHT TOTAL HIP REVISION   TOTAL HIP REVISION Right 09/17/2013   Procedure: REVISION RIGHT TOTAL HIP ARTHROPLASTY ;  Surgeon: [name], MD;  [facility];  Service: Orthopedics;   Laterality: Right;   TOTAL HIP REVISION Right 10/26/2013   Procedure: REVISION RIGHT TOTAL HIP ARTHROPLASTY;  Surgeon: [name], MD;  [facility];  Service: Orthopedics;  Laterality: Right;   TOTAL KNEE ARTHROPLASTY Bilateral right 03-15-2011;  left 06-30-2011   TOTAL KNEE REVISION Left 04/11/2017   Procedure: TOTAL KNEE REVISION PATELLA and TIBIA;  Surgeon: Frederik Pear, MD;  Location: [place];  Service: Orthopedics;  Laterality: Left;    SOCIAL HISTORY: Social History   Socioeconomic History   Marital status: Married    Spouse name: Not on file   Number of children: Not on file   Years of education: Not on file   Highest education level: Not on file  Occupational History   Not on file  Tobacco Use   Smoking status: Former  Packs/day: 1.00    Years: 50.00    Total pack years: 50.00    Types: Cigarettes    Quit date: 01/12/2012    Years since quitting: 10.3   Smokeless tobacco: Never  Vaping Use   Vaping Use: Never used  Substance and Sexual Activity   Alcohol use: Not Currently    Comment: occasional-social   Drug use: Never   Sexual activity: Yes  Other Topics Concern   Not on file  Social History Narrative   [place] 2.5 months, went home Feb 21st slipped and fell on back and developed.  Home PT/OT.  Just started outpatient physical therapy.  Friday night, misstepped.       Pt spouse verbalized pt had fallen 4 times within the last 24 hours     Social Determinants of Health   Financial Resource Strain: Low Risk  (01/07/2022)   Overall Financial Resource Strain (CARDIA)    Difficulty of Paying Living Expenses: Not hard at all  Food Insecurity: No Food Insecurity (01/07/2022)   Hunger Vital Sign    Worried About Running Out of Food in the Last Year: Never true    Ran Out of Food in the Last Year: Never true  Transportation Needs: No Transportation Needs (01/07/2022)   PRAPARE - Hydrologist (Medical): No    Lack of Transportation  (Non-Medical): No  Physical Activity: Sufficiently Active (01/07/2022)   Exercise Vital Sign    Days of Exercise per Week: 3 days    Minutes of Exercise per Session: 60 min  Stress: No Stress Concern Present (01/07/2022)   [place]    Feeling of Stress : Not at all  Social Connections: [place] (01/07/2022)   Social Connection and Isolation Panel [NHANES]    Frequency of Communication with Friends and Family: More than three times a week    Frequency of Social Gatherings with Friends and Family: More than three times a week    Attends Religious Services: 1 to 4 times per year    Active Member of [employer] or Organizations: No    Attends Music therapist: 1 to 4 times per year    Marital Status: Married  Human resources officer Violence: Not At Risk (01/07/2022)   Humiliation, Afraid, Rape, and Kick questionnaire    Fear of Current or Ex-Partner: No    Emotionally Abused: No    Physically Abused: No    Sexually Abused: No    FAMILY HISTORY: Family History  Problem Relation Age of Onset   CAD Father 68   Asthma Father    Alcohol abuse Father    Arthritis Mother    Alcohol abuse Sister     ALLERGIES:  is allergic to short ragweed pollen ext.  MEDICATIONS:  Current Outpatient Medications  Medication Sig Dispense Refill   acetaminophen (TYLENOL) 325 MG tablet Take 2 tablets (650 mg total) by mouth every 6 (six) hours as needed for mild pain (or Fever >/= 101). 20 tablet    albuterol (VENTOLIN HFA) 108 (90 Base) MCG/ACT inhaler Inhale 2 puffs into the lungs every 6 (six) hours as needed for wheezing or shortness of breath.     alfuzosin (UROXATRAL) 10 MG 24 hr tablet TAKE 1 TABLET AT BEDTIME (Patient taking differently: Take 10 mg by mouth at bedtime.) 90 tablet 3   allopurinol (ZYLOPRIM) 100 MG tablet Take 1 tablet (100 mg total) by mouth 2 (two) times daily. [place]  tablet 0   atorvastatin (LIPITOR) 80 MG  tablet Take 80 mg by mouth at bedtime.     buPROPion (WELLBUTRIN XL) 300 MG 24 hr tablet TAKE 1 TABLET EVERY DAY WITH BREAKFAST (Patient taking differently: Take 300 mg by mouth daily with breakfast.) 90 tablet 3   Calcium Citrate-Vitamin D (CALCIUM CITRATE + D PO) Take 1 tablet by mouth 2 (two) times daily. '1200mg'$  of Calcium and 1000units vitamin D3     Cholecalciferol (VITAMIN D-3 PO) Take 1 capsule by mouth 2 (two) times daily.     clonazePAM (KLONOPIN) 1 MG tablet Take 1 tablet (1 mg total) by mouth every 6 (six) hours as needed for anxiety. Take 1 tablet (1 mg total) by mouth every 6 (six) hours as needed. for anxiety Strength: 1 mg (Patient taking differently: Take 1 mg by mouth See admin instructions. Take one tablet (1 mg) by mouth daily with lunch, may also take one tablet (1 mg) at bedtime as needed for sleep) 10 tablet 0   CRANBERRY PO Take 2 tablets by mouth every morning.     denosumab (PROLIA) 60 MG/ML SOSY injection Inject 60 mg as directed every 6 (six) months.     dexamethasone (DECADRON) 4 MG tablet Take 5 tablets (20 mg total) by mouth daily. Start the day after carboplatin chemotherapy for 3 days. (Patient taking differently: Take 20 mg by mouth See admin instructions. Take 5 tablets (20 mg) by mouth on days 1-3 after carboplatin chemotherapy) 30 tablet 3   diltiazem (CARDIZEM CD) 180 MG 24 hr capsule Take 1 capsule by mouth daily. (Patient not taking: Reported on 05/04/2022) 30 capsule 0   Docusate Sodium 100 MG capsule Take 100 mg by mouth 2 (two) times daily.     DULoxetine (CYMBALTA) 60 MG capsule TAKE 1 CAPSULE TWICE DAILY (Patient taking differently: Take 60 mg by mouth See admin instructions. Take one capsule (60 mg) by mouth twice daily - with lunch and supper) 180 capsule 3   EPINEPHrine 0.3 mg/0.3 mL IJ SOAJ injection Inject 0.3 mg into the muscle once as needed for anaphylaxis (severe allergic reaction).     ezetimibe (ZETIA) 10 MG tablet TAKE 1 TABLET EVERY DAY (Patient  taking differently: Take 10 mg by mouth every morning.) 90 tablet 2   feeding supplement (ENSURE ENLIVE / ENSURE PLUS) LIQD Take 237 mLs by mouth 2 (two) times daily between meals. (Patient taking differently: Take 237 mLs by mouth daily.) 237 mL 12   ferrous sulfate 325 (65 FE) MG tablet Take 325 mg by mouth 2 (two) times daily with a meal.      HYDROcodone-acetaminophen (NORCO) 7.5-325 MG tablet Take 1 tablet by mouth every 6 (six) hours as needed for severe pain. 10 tablet 0   levothyroxine (SYNTHROID) 100 MCG tablet Take 100 mcg by mouth daily before breakfast.     lidocaine-prilocaine (EMLA) cream Apply 1 Application topically as needed. (Patient not taking: Reported on 04/23/2022) 30 g 0   loratadine (CLARITIN) 10 MG tablet Take 10 mg by mouth every morning.     Magnesium 500 MG CAPS Take 1 capsule (500 mg total) by mouth daily. (Patient taking differently: Take 500 mg by mouth daily with supper.) 30 capsule 1   meropenem 1 g in sodium chloride 0.9 % 100 mL Inject 1 g into the vein every 8 (eight) hours for 7 days. 21 Doses/Fill 0   metoprolol tartrate (LOPRESSOR) 25 MG tablet Take 1 tablet by mouth every morning. 30 tablet  0   Multiple Vitamin (MULTIVITAMIN WITH MINERALS) TABS tablet Take 1 tablet by mouth every morning. Men's One-A-Day 50+     MYRBETRIQ 25 MG TB24 tablet TAKE 1 TABLET EVERY DAY (Patient taking differently: Take 25 mg by mouth at bedtime.) 90 tablet 3   nitroGLYCERIN (NITROSTAT) 0.4 MG SL tablet Place 0.4 mg under the tongue every 5 (five) minutes as needed for chest pain (max 3 doses).     omeprazole (PRILOSEC) 40 MG capsule TAKE 1 CAPSULE EVERY DAY BEFORE BREAKFAST (Patient taking differently: Take 40 mg by mouth every morning.) 90 capsule 3   ondansetron (ZOFRAN) 8 MG tablet Take 1 tablet (8 mg total) by mouth 2 (two) times daily as needed. Start on the 3rd day after cisplatin chemotherapy. (Patient taking differently: Take 8 mg by mouth 2 (two) times daily as needed for  nausea or vomiting. Start on the 3rd day after cisplatin chemotherapy.) 30 tablet 1   polyethylene glycol (MIRALAX / GLYCOLAX) 17 g packet Take 17 g by mouth daily. 14 each 0   Potassium Citrate 15 MEQ (1620 MG) TBCR TAKE 1 TABLET TWICE DAILY (Patient taking differently: Take 1 tablet by mouth See admin instructions. Take one tablet (15 meq) by mouth twice daily - with lunch and supper) 180 tablet 3   Probiotic Product (PROBIOTIC PO) Take 1 capsule by mouth daily with breakfast.     prochlorperazine (COMPAZINE) 10 MG tablet Take 1 tablet (10 mg total) by mouth every 6 (six) hours as needed (Nausea or vomiting). 30 tablet 1   solifenacin (VESICARE) 10 MG tablet Take 10 mg by mouth daily with supper.     testosterone enanthate (DELATESTRYL) 200 MG/ML injection INJECT 1ML ('200MG'$ ) INTO THE MUSCLE EVERY 14 DAYS. DISCARD VIAL AFTER 28 DAYS. (Patient taking differently: Inject 100 mg into the muscle every 14 (fourteen) days. Every other Friday) 5 mL 3   vitamin C (ASCORBIC ACID) 500 MG tablet Take 500 mg by mouth daily with lunch.     No current facility-administered medications for this visit.    REVIEW OF SYSTEMS:   10 Point review of Systems was done is negative except as noted above.   PHYSICAL EXAMINATION:  ECOG FS:3 - Symptomatic, >50% confined to bed  There were no vitals filed for this visit.  Wt Readings from Last 3 Encounters:  05/10/22 259 lb 4 oz (117.6 kg)  05/04/22 258 lb (117 kg)  04/22/22 250 lb (113.4 kg)    There is no height or weight on file to calculate BMI.   NAD GENERAL:alert, in no acute distress and comfortable SKIN: no acute rashes, no significant lesions EYES: conjunctiva are pink and non-injected, sclera anicteric OROPHARYNX: MMM, no exudates, no oropharyngeal erythema or ulceration NECK: supple, no JVD LYMPH:  no palpable lymphadenopathy in the cervical, axillary or inguinal regions LUNGS: clear to auscultation b/l with normal respiratory effort HEART:  regular rate & rhythm ABDOMEN:  normoactive bowel sounds , non tender, not distended. Extremity: no pedal edema PSYCH: alert & oriented x 3 with fluent speech NEURO: no focal motor/sensory deficits  Exam performed in chair.  LABORATORY DATA:  I have reviewed the data as listed     Latest Ref Rng & Units 05/10/2022    8:39 AM 05/04/2022    7:51 AM 04/27/2022    4:48 AM  CBC  WBC 4.0 - 10.5 K/uL 3.8  14.6  11.7   Hemoglobin 13.0 - 17.0 g/dL 11.1  11.4  10.3   Hematocrit 39.0 -  52.0 % 32.0  33.6  30.4   Platelets 150 - 400 K/uL 237  223  46        Latest Ref Rng & Units 05/10/2022    8:39 AM 05/04/2022    7:51 AM 04/27/2022    4:48 AM  CMP  Glucose 70 - 99 mg/dL 120  122  141   BUN 8 - 23 mg/dL '31  26  19   '$ Creatinine 0.61 - 1.24 mg/dL 0.98  1.12  1.05   Sodium 135 - 145 mmol/L 131  134  136   Potassium 3.5 - 5.1 mmol/L 4.2  4.2  3.8   Chloride 98 - 111 mmol/L 97  100  102   CO2 22 - 32 mmol/L '31  28  27   '$ Calcium 8.9 - 10.3 mg/dL 8.3  8.6  8.3   Total Protein 6.5 - 8.1 g/dL 5.4  5.6    Total Bilirubin 0.3 - 1.2 mg/dL 1.0  0.8    Alkaline Phos 38 - 126 U/L 55  82    AST 15 - 41 U/L 28  21    ALT 0 - 44 U/L 60  28     . Lab Results  Component Value Date   LDH 319 (H) 05/04/2022   06/28/2019 ECHOCARDIOGRAM COMPLETE (Accession [id number])   05/29/18 Left tonsil biopsy:   RADIOGRAPHIC STUDIES: I have personally reviewed the radiological images as listed and agreed with the findings in the report.   .CT Angio Chest PE W and/or Wo Contrast  Result Date: 04/22/2022 CLINICAL DATA:  Concern for pulmonary embolism. Diffuse large B-cell lymphoma. EXAM: CT ANGIOGRAPHY CHEST WITH CONTRAST TECHNIQUE: Multidetector CT imaging of the chest was performed using the standard protocol during bolus administration of intravenous contrast. Multiplanar CT image reconstructions and MIPs were obtained to evaluate the vascular anatomy. RADIATION DOSE REDUCTION: This exam was performed  according to the departmental dose-optimization program which includes automated exposure control, adjustment of the mA and/or kV according to patient size and/or use of iterative reconstruction technique. CONTRAST:  150m OMNIPAQUE IOHEXOL 350 MG/ML SOLN COMPARISON:  Chest radiograph dated 04/22/2022 and CT dated 12/11/2016. PET CT dated 03/02/2022. FINDINGS: Evaluation of this exam is limited due to respiratory motion artifact. Cardiovascular: There is no cardiomegaly or pericardial effusion. There is coronary vascular calcification. Moderate atherosclerotic calcification of the thoracic aorta. No aneurysmal dilatation or dissection. Evaluation of the pulmonary arteries is limited due to respiratory motion artifact and suboptimal visualization of the peripheral branches. No central pulmonary artery embolus identified. Right-sided Port-A-Cath with tip in the central SVC. Mediastinum/Nodes: Mildly enlarged lymph node to the left of the esophagus measures 11 mm short axis (27/4). No hilar or other mediastinal adenopathy. The esophagus is grossly unremarkable. No mediastinal fluid collection. Lungs/Pleura: There is a 2.7 x 2.5 cm right lower lobe nodule concerning for malignancy. No focal consolidation, pleural effusion, or pneumothorax. The central airways are patent. Upper Abdomen: Bilateral adrenal thickening. Musculoskeletal: Osteopenia with degenerative changes of the spine. Old healed right rib fractures. Fractures of the T5-T10 spinous process. Review of the MIP images confirms the above findings. IMPRESSION: 1. No CT evidence of central pulmonary artery embolus. 2. Right lower lobe nodule concerning for malignancy. 3. Mildly enlarged left paraesophageal lymph node. 4. Fractures of the T5-T10 spinous process. 5. Aortic Atherosclerosis (ICD10-I70.0). Electronically Signed   By: AAnner CreteM.D.   On: 04/22/2022 21:11   CT HEAD WO CONTRAST (5MM)  Result Date: 04/22/2022 CLINICAL DATA:  Dizziness EXAM: CT  HEAD WITHOUT CONTRAST TECHNIQUE: Contiguous axial images were obtained from the base of the skull through the vertex without intravenous contrast. RADIATION DOSE REDUCTION: This exam was performed according to the departmental dose-optimization program which includes automated exposure control, adjustment of the mA and/or kV according to patient size and/or use of iterative reconstruction technique. COMPARISON:  03/22/2022 FINDINGS: Brain: There is atrophy and chronic small vessel disease changes. No acute intracranial abnormality. Specifically, no hemorrhage, hydrocephalus, mass lesion, acute infarction, or significant intracranial injury. Vascular: No hyperdense vessel or unexpected calcification. Skull: No acute calvarial abnormality. Sinuses/Orbits: No acute findings Other: None IMPRESSION: Atrophy, chronic microvascular disease. No acute intracranial abnormality. Electronically Signed   By: Rolm Baptise M.D.   On: 04/22/2022 17:16   DG Chest Portable 1 View  Result Date: 04/22/2022 CLINICAL DATA:  dizziness, hypotension EXAM: PORTABLE CHEST 1 VIEW COMPARISON:  Mar 06, 2022 FINDINGS: Cardiomediastinal silhouette is mildly prominent and stable. Thoracic aorta is ectatic. Again seen is the right-sided Port-A-Cath with its tip at the upper SVC region. There is no pulmonary consolidation, pleural effusion or vascular congestion. The visualized skeletal structures are unremarkable. IMPRESSION: No active disease. Electronically Signed   By: Frazier Richards M.D.   On: 04/22/2022 17:04    ASSESSMENT & PLAN:  76 y.o. male with  1. Diffuse Large B-Cell Lymphoma,- Stage III Left tonsil -05/29/18 Left tonsil biopsy which revealed Diffuse Large B-Cell Lymphoma, which requires treatment  -06/07/18 LDH at 214, will monitor change through treatment.  -Baseline ECHO 06/14/18: EF 50-55% -Standard regimen of R-CHOP with G-CSF support for 6 cycles starting 06/23/18  06/16/18 PET/CT revealed 1. Deauville 5 hypermetabolic  lesions in the palatine tonsils, left internal jugular chain, and involving a right inguinal lymph node. Deauville 4 involvement of a left inguinal lymph node and an enlarged subcutaneous lesion favoring lymph node superficial to the lower right sternocleidomastoid muscle in the neck. 2. Other imaging findings of potential clinical significance: Aortic Atherosclerosis. Coronary atherosclerosis. Emphysema. Bilateral nonobstructive nephrolithiasis. Umbilical hernia contains adipose tissues. Old right rib fractures some of which are nonunited. 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   08/22/18 PET/CT revealed Interval response to therapy. Overall Deauville criteria 3. Interval decrease in size and FDG uptake associated with palatine tonsil lesions. There has been resolution of previous left level-II and bilateral inguinal lymph nodes. 2. Enlarged subcutaneous lesion superficial to the lower right sternocleidomastoid muscle exhibits decreased FDG uptake. Currently Deauville criteria 2. 3. No new or progressive sites of disease identified. 4.  Aortic Atherosclerosis. 5. Multi vessel coronary artery atherosclerotic calcifications. 6. Unchanged vertebra plana deformity. 7. Kidney stones.   S/p 6 cycles of R-CHOP completed on 10/12/18  11/22/18 PET/CT revealed The tonsillar mass has essentially resolved. The large presumed lymph node superficial to the right sternocleidomastoid is stable in size at 2.3 cm in short axis, with maximum SUV of 3.3 (formerly 2.4), representing Deauville 3 activity, previously Deauville 2. 2. Other imaging findings of potential clinical significance: Aortic Atherosclerosis and Emphysema. Coronary atherosclerosis. Bilateral nonobstructive nephrolithiasis. Umbilical hernia contains adipose tissue. Chronic high activity posterior to the right hip implant along a region of bony irregularity and deformity probably reflecting chronic inflammation. L1 vertebra plana.  2.  Recurrent large B-cell lymphoma with left  buttock soft tissue mass, pulmonary mass, widespread lymphadenopathy  #3 acute on chronic renal insufficiency likely from dehydration cannot rule out urinary retention given patient's chronic bladder issues.  Patient follows with Dr. Junious Silk of urology  #4 altered mental status  and failure to thrive in the context of poor p.o. intake and recent recurrent UTI  Recent MRI of the brain showed no evidence of lymphoma involvement  Plan -Labs done today were discussed in detail with the patient -We reviewed his recent hospitalization for sepsis from UTI and presence of ESBL E. Coli. -UA still shows significant pyuria and the patient is still symptomatic.  He was treated with suboptimal course of meropenem and will be scheduled for another week of meropenem at home through home care services. -We will refer him to infectious disease for development of strategy regarding his recurrent urinary tract infections especially with ESBL organisms. -He also has an appointment with urology coming up next month for evaluation of his recurrent UTIs -He will proceed with his third cycle of R GCD chemotherapy -PET CT scan in 2 weeks Return to clinic with Dr. Irene Limbo in 3 weeks  FOLLOW UP: Referral to home care services for IV meropenem 1 g every 8 hours for 1 week -PET/CT in 2 weeks -Return to clinic with Dr. Irene Limbo in 3 weeks -Referral to [facility] for consideration of consolidative treatment with CAR-T cell therapy  The total time spent in the appointment was 41 minutes*.  All of the patient's questions were answered with apparent satisfaction. The patient knows to call the clinic with any problems, questions or concerns.   Sullivan Lone MD MS AAHIVMS [facility] Hematology/Oncology Physician [facility]  .*Total Encounter Time as defined by the Centers for Medicare and Medicaid Services includes, in addition to the face-to-face time of a patient visit (documented in the note above)  non-face-to-face time: obtaining and reviewing outside history, ordering and reviewing medications, tests or procedures, care coordination (communications with other health care professionals or caregivers) and documentation in the medical record.

## 2022-05-11 NOTE — Telephone Encounter (Signed)
Beth with [facility] called to request verbal orders for left knee skin tear wound care. Call back number is [phone]

## 2022-05-12 DIAGNOSIS — J301 Allergic rhinitis due to pollen: Secondary | ICD-10-CM | POA: Diagnosis not present

## 2022-05-12 DIAGNOSIS — J300 Vasomotor rhinitis: Secondary | ICD-10-CM | POA: Diagnosis not present

## 2022-05-12 DIAGNOSIS — L309 Dermatitis, unspecified: Secondary | ICD-10-CM | POA: Diagnosis not present

## 2022-05-12 DIAGNOSIS — J4520 Mild intermittent asthma, uncomplicated: Secondary | ICD-10-CM | POA: Diagnosis not present

## 2022-05-16 NOTE — Telephone Encounter (Signed)
Okay. Thank you.

## 2022-05-16 NOTE — Telephone Encounter (Signed)
Okay.  Thanks.

## 2022-05-17 ENCOUNTER — Ambulatory Visit (INDEPENDENT_AMBULATORY_CARE_PROVIDER_SITE_OTHER): Payer: Medicare PPO | Admitting: Family

## 2022-05-17 ENCOUNTER — Encounter (HOSPITAL_BASED_OUTPATIENT_CLINIC_OR_DEPARTMENT_OTHER): Payer: Self-pay | Admitting: Family

## 2022-05-17 ENCOUNTER — Ambulatory Visit: Payer: Medicare PPO | Admitting: Infectious Disease

## 2022-05-17 VITALS — BP 118/72 | HR 102 | Ht 75.0 in | Wt 257.8 lb

## 2022-05-17 DIAGNOSIS — E039 Hypothyroidism, unspecified: Secondary | ICD-10-CM | POA: Diagnosis not present

## 2022-05-17 DIAGNOSIS — I10 Essential (primary) hypertension: Secondary | ICD-10-CM | POA: Diagnosis not present

## 2022-05-17 DIAGNOSIS — I4811 Longstanding persistent atrial fibrillation: Secondary | ICD-10-CM

## 2022-05-17 DIAGNOSIS — I2510 Atherosclerotic heart disease of native coronary artery without angina pectoris: Secondary | ICD-10-CM

## 2022-05-17 DIAGNOSIS — I5032 Chronic diastolic (congestive) heart failure: Secondary | ICD-10-CM

## 2022-05-17 MED ORDER — NITROGLYCERIN 0.4 MG SL SUBL: 0.4000 mg | SUBLINGUAL_TABLET | SUBLINGUAL | 4 refills | Status: DC | PRN

## 2022-05-17 MED ORDER — METOPROLOL TARTRATE 25 MG PO TABS: 25.0000 mg | ORAL_TABLET | Freq: Two times a day (BID) | ORAL | 3 refills | Status: DC

## 2022-05-17 MED ORDER — DILTIAZEM HCL ER COATED BEADS 180 MG PO CP24: 180.0000 mg | ORAL_CAPSULE | Freq: Every day | ORAL | 3 refills | Status: DC

## 2022-05-17 NOTE — Telephone Encounter (Signed)
Called [name] no answer LMOM w/ MD response.Marland KitchenJohny Lane

## 2022-05-17 NOTE — Patient Instructions (Signed)
Medication Instructions:  Your Physician recommend you continue on your current medication as directed.    We have refilled your Nitroglycerin, Diltiazem, and Metoprolol.  *If you need a refill on your cardiac medications before your next appointment, please call your pharmacy*  Follow-Up: At [facility], you and your health needs are our priority.  As part of our continuing mission to provide you with exceptional heart care, we have created designated Provider Care Teams.  These Care Teams include your primary Cardiologist (physician) and Advanced Practice Providers (APPs -  Physician Assistants and Nurse Practitioners) who all work together to provide you with the care you need, when you need it.  We recommend signing up for the patient portal called "MyChart".  Sign up information is provided on this After Visit Summary.  MyChart is used to connect with patients for Virtual Visits (Telemedicine).  Patients are able to view lab/test results, encounter notes, upcoming appointments, etc.  Non-urgent messages can be sent to your provider as well.   To learn more about what you can do with MyChart, go to [email or web address].    Your next appointment:   3 month(s)  The format for your next appointment:   In Person  Provider:   Skeet Latch, MD or Laurann Montana, NP{  Other Instructions Heart Healthy Diet Recommendations: A low-salt diet is recommended. Meats should be grilled, baked, or boiled. Avoid fried foods. Focus on lean protein sources like fish or chicken with vegetables and fruits. The American Heart Association is a Microbiologist!  American Heart Association Diet and Lifeystyle Recommendations   Exercise recommendations: The American Heart Association recommends 150 minutes of moderate intensity exercise weekly. Try 30 minutes of moderate intensity exercise 4-5 times per week. This could include walking, jogging, or swimming.   Important Information About  Sugar     '

## 2022-05-17 NOTE — Telephone Encounter (Signed)
Called Beth no answer LMOM w/MD response..Princella Pellegrini

## 2022-05-17 NOTE — Progress Notes (Unsigned)
Office Visit    Patient Name: [name] Date of Encounter: 05/19/2022  PCP:  Cassandria Anger, MD   [place]  Cardiologist:  Skeet Latch, MD  Advanced Practice Provider:  No care team member to display Electrophysiologist:  None      Chief Complaint    [name] is a 76 y.o. male presents today for hospital follow-up  Past Medical History    Past Medical History:  Diagnosis Date   Anticoagulant long-term use    xarelto--- takes for AFib,  managed by pcp, Dr Alain Marion   Benign localized prostatic hyperplasia with lower urinary tract symptoms (LUTS)    CAD (coronary artery disease) cardiologist--  dr t. Oval Linsey   hx of known CAD obstruction w/ collaterals (cath done @ [facility] 12-31-2011) ;   last cardiac cath 08-13-2019  showed sig. 2V CAD involving proxLAD and CTO of the RCA/  chronic total occlusion midRCA w/ bridging and L>R collaterals   Diastolic CHF ([place])    dx [id number] hospital admission (followed by pcp)   Diffuse large B cell lymphoma [facility]) oncologist-- dr Irene Limbo--- in remission   dx 08/ 2019 -- bx left tonsill mass-- involving lymph nodes--- completed chemotherapy 10-12-2018   DJD (degenerative joint disease)    Dyspnea    Dysthymic disorder    Elevated brain natriuretic peptide (BNP) level    Environmental allergies    GAD (generalized anxiety disorder)    GERD (gastroesophageal reflux disease)    History of falling    multiple times   History of Graves' disease    1987 s/p total thyroidectomy   History of kidney stones    History of scabies    07/ 2014  resolved   History of syncope    per D/C note in epic 02-21-2013 ?vasovagal   History of vertebral compression fracture    05/ 2014 --  L1, no surgical intervention   HLD (hyperlipidemia)    Hyperlipidemia    Hypertension    Hypogonadism in male    IDA (iron deficiency anemia)    Lumbago    OA (osteoarthritis)    OSA on CPAP    cpap machine-settings 17    Osteomyelitis of right wrist ([place]) 11/02/2012   Osteoporosis    Severe   Osteoporosis with fracture 04/24/2013   Pathological fracture due to osteoporosis with delayed healing 04/24/2013   Persistent atrial fibrillation [facility])    cardiologist--- dr Alfonso Patten. [place]   Plaque psoriasis    dermatologist--- dr Harriett Sine--- currently taking otezla   Post-surgical hypothyroidism    followed by pcp---  s/p total thyroidectomy for graves disease in 1987   Pure hypercholesterolemia 02/10/2021   Renal calculus, right    Right femoral fracture ([place]) 09/29/2012   Right forearm fracture 09/29/2012   Right tibial fracture 02/05/2013   Thrombocytopenia (HCC)    Unsteady gait    uses Sufyan Meidinger   Wears hearing aid in both ears    Past Surgical History:  Procedure Laterality Date   APPENDECTOMY  2011 approx.    CARDIAC CATHETERIZATION  12-31-2011   dr j. Beatrix Fetters '@[facility]'$    normal LVF w/ multivessel CAD,  occluded RCA w/ colleterals   CATARACT EXTRACTION W/ INTRAOCULAR LENS  IMPLANT, BILATERAL  2016 approx.   COLONOSCOPY     CYSTOSCOPY WITH RETROGRADE PYELOGRAM, URETEROSCOPY AND STENT PLACEMENT Left 11/07/2017   Procedure: CYSTOSCOPY WITH RETROGRADE PYELOGRAM, URETEROSCOPY AND STENT PLACEMENT;  Surgeon: Cleon Gustin, MD;  Location:  [facility];  Service: Urology;  Laterality: Left;   CYSTOSCOPY WITH RETROGRADE PYELOGRAM, URETEROSCOPY AND STENT PLACEMENT Right 10/22/2019   Procedure: CYSTOSCOPY WITH RETROGRADE PYELOGRAM, URETEROSCOPY AND STENT PLACEMENT;  Surgeon: Cleon Gustin, MD;  Location: [facility];  Service: Urology;  Laterality: Right;   CYSTOSCOPY/URETEROSCOPY/HOLMIUM LASER/STENT PLACEMENT Right 03/11/2017   Procedure: CYSTOSCOPY/URETEROSCOPYSTENT PLACEMENT right ureter retrograde pylegram;  Surgeon: Cleon Gustin, MD;  Location: [facility];  Service: Urology;  Laterality: Right;   HARDWARE REMOVAL  10/05/2012   Procedure: HARDWARE REMOVAL;  Surgeon: [name], MD;   Location: [facility];  Service: Orthopedics;  Laterality: Right;  REMOVING  STRYKER  GAMMA NAIL   HARDWARE REMOVAL Right 07/03/2013   Procedure: HARDWARE REMOVAL RIGHT TIBIA ;  Surgeon: Rozanna Box, MD;  Location: [place];  Service: Orthopedics;  Laterality: Right;   HIP CLOSED REDUCTION Right 10/15/2013   Procedure: CLOSED MANIPULATION HIP;  Surgeon: [name], MD;  [facility];  Service: Orthopedics;  Laterality: Right;   HOLMIUM LASER APPLICATION Right 05/18/4165   Procedure: HOLMIUM LASER APPLICATION;  Surgeon: Cleon Gustin, MD;  [facility];  Service: Urology;  Laterality: Right;   HOLMIUM LASER APPLICATION Left 0/63/0160   Procedure: HOLMIUM LASER APPLICATION;  Surgeon: Cleon Gustin, MD;  [facility];  Service: Urology;  Laterality: Left;   HOLMIUM LASER APPLICATION Left 1/0/9323   Procedure: HOLMIUM LASER APPLICATION;  Surgeon: Cleon Gustin, MD;  [facility];  Service: Urology;  Laterality: Left;   HOLMIUM LASER APPLICATION Right 5/57/3220   Procedure: HOLMIUM LASER APPLICATION;  Surgeon: Cleon Gustin, MD;  Location: [facility];  Service: Urology;  Laterality: Right;   INCISION AND DRAINAGE HIP Right 11/16/2013   Procedure: IRRIGATION AND DEBRIDEMENT RIGHT HIP;  Surgeon: [name], MD;  [facility];  Service: Orthopedics;  Laterality: Right;   INTRAVASCULAR PRESSURE WIRE/FFR STUDY N/A 08/13/2019   Procedure: INTRAVASCULAR PRESSURE WIRE/FFR STUDY;  Surgeon: Nelva Bush, MD;  Location: [place] [facility];  Service: Cardiovascular;  Laterality: N/A;   IR IMAGING GUIDED PORT INSERTION  06/22/2018   IR NEPHROSTOMY PLACEMENT LEFT  03/28/2019   IR URETERAL STENT LEFT NEW ACCESS W/O SEP NEPHROSTOMY CATH  10/24/2017   IR URETERAL STENT LEFT NEW ACCESS W/O SEP NEPHROSTOMY CATH  03/26/2019   NEPHROLITHOTOMY Right 02/08/2017   Procedure: NEPHROLITHOTOMY PERCUTANEOUS WITH SURGEON ACCESS;  Surgeon: Cleon Gustin, MD;  [facility];  Service: Urology;  Laterality: Right;   NEPHROLITHOTOMY Left 10/24/2017   Procedure: NEPHROLITHOTOMY PERCUTANEOUS;  Surgeon: Cleon Gustin, MD;  [facility];  Service: Urology;  Laterality: Left;   NEPHROLITHOTOMY Left 03/26/2019   Procedure: NEPHROLITHOTOMY PERCUTANEOUS;  Surgeon: Cleon Gustin, MD;  [facility];  Service: Urology;  Laterality: Left;  2 HRS   NEPHROLITHOTOMY Left 04/17/2019   Procedure: NEPHROLITHOTOMY PERCUTANEOUS;  Surgeon: Cleon Gustin, MD;  [facility];  Service: Urology;  Laterality: Left;  2 HRS   ORIF TIBIA FRACTURE Right 02/06/2013   Procedure: OPEN REDUCTION INTERNAL FIXATION (ORIF) TIBIA FRACTURE WITH IM ROD FIBULA;  Surgeon: Rozanna Box, MD;  Location: [place];  Service: Orthopedics;  Laterality: Right;   ORIF TIBIA FRACTURE Right 07/03/2013   Procedure: RIGHT TIBIA NON UNION REPAIR ;  Surgeon: Rozanna Box, MD;  Location: [place];  Service: Orthopedics;  Laterality: Right;   ORIF WRIST FRACTURE  10/02/2012   Procedure: OPEN REDUCTION INTERNAL FIXATION (ORIF)  WRIST FRACTURE;  Surgeon: Roseanne Kaufman, MD;  [facility];  Service: Orthopedics;  Laterality: Right;  WITH   ANTIBIOTIC  CEMENT   ORIF WRIST FRACTURE Left 10/28/2013   Procedure: OPEN REDUCTION INTERNAL FIXATION (ORIF) WRIST FRACTURE with allograft;  Surgeon: Roseanne Kaufman, MD;  [facility];  Service: Orthopedics;  Laterality: Left;  DVR Plate   QUADRICEPS TENDON REPAIR Left 07/15/2017   Procedure: REPAIR QUADRICEP TENDON;  Surgeon: Frederik Pear, MD;  Location: [place];  Service: Orthopedics;  Laterality: Left;   RIGHT/LEFT HEART CATH AND CORONARY ANGIOGRAPHY N/A 08/13/2019   Procedure: RIGHT/LEFT HEART CATH AND CORONARY ANGIOGRAPHY;  Surgeon: Nelva Bush, MD;  Location: [place] [facility];  Service: Cardiovascular;  Laterality: N/A;   THYROIDECTOMY  02/1986   TOTAL HIP ARTHROPLASTY Right 03-16-2016   '@[facility]'$    TOTAL HIP REVISION  10/05/2012   Procedure: TOTAL  HIP REVISION;  Surgeon: [name], MD;  [facility];  Service: Orthopedics;  Laterality: Right;  RIGHT TOTAL HIP REVISION   TOTAL HIP REVISION Right 09/17/2013   Procedure: REVISION RIGHT TOTAL HIP ARTHROPLASTY ;  Surgeon: [name], MD;  [facility];  Service: Orthopedics;  Laterality: Right;   TOTAL HIP REVISION Right 10/26/2013   Procedure: REVISION RIGHT TOTAL HIP ARTHROPLASTY;  Surgeon: [name], MD;  [facility];  Service: Orthopedics;  Laterality: Right;   TOTAL KNEE ARTHROPLASTY Bilateral right 03-15-2011;  left 06-30-2011   TOTAL KNEE REVISION Left 04/11/2017   Procedure: TOTAL KNEE REVISION PATELLA and TIBIA;  Surgeon: Frederik Pear, MD;  Location: [place];  Service: Orthopedics;  Laterality: Left;   Allergies  Allergies  Allergen Reactions   Short Ragweed Pollen Ext Cough    History of Present Illness    [name] is a 76 y.o. male with a hx of relapsed large B-cell lymphoma, CAD, recurrent UTI, atrial fibrillation, BPH, diastolic heart failure, hypertension, hyperlipidemia, OSA, hypothyroid, prior tobacco use last seen 10/28/2021.  Initially diagnosed atrial fibrillation around 2012 and started on diltiazem and amiodarone.  Amiodarone discontinued due to persistent atrial fibrillation.  Underwent LHC at time of diagnosis with reported 90% obstructive of unknown vessel that developed collaterals.  He was started on Ranexa.  Echo 10/2016 EF 60 to 65%, severe bilateral atrial enlargement, IVC dilated.  He had no angina so Ranexa discontinued.  Diagnosed with diffuse large B-cell lymphoma 05/2018.  Completed 6 cycles of chemotherapy.  Based on echo 06/2018 EF 50 to 25%, normal diastolic function, apical hypokinesis.  Heart rate poorly controlled to metoprolol added.  Seen in the ED and BNP elevated to Lasix 20 mg daily added with improvement in breathing.  Echo 06/2019 EF 50-55%, RV mildly reduced systolic function.  Lasix was transitioned to  hydrochlorothiazide due to urinary frequency and poor blood pressure control.  LHC 08/2019 significant two-vessel CAD sequential 60 to 70% mid LAD lesions and CTO of mid RCA with bridging left-to-right collaterals.  Consideration for two-vessel CABG versus medical management.  Deemed not candidate for surgery.  He was started on Imdur.  Due to kidney stones and urination HCTZ reduced and losartan increased.  Last seen 10/28/2021.  He had successfully completed the prep program and was continue to work out.  He was doing well from a cardiac perspective and no changes made.  03/22/22 chemo resumed due to recurrence of diffuse large b-cell lymphoma.   Admitted 7/13 - 04/27/2022 after presenting with fatigue, lightheadedness found to have severe sepsis treated with  IVF and IV abx due to urinary tract infection E. coli ESBL. Due to orthostatic hypotension and near syncope, diltiazem dose reduced. Xarelto and aspirin held due to thrombocytopenia.   Seen 05/04/2022 by his oncology team and referred to infectious disease for recurrent UTI.  He was also set up for IV ABX through home care.  He presents today for follow-up of his abdomen.  Reports feeling fair since hospital discharge.  Denies chest pain, orthopnea, edema, PND.  Exertional dyspnea stable at baseline.  Reports heart rate at home routinely less than 100 bpm.  No palpitations.  EKGs/Labs/Other Studies Reviewed:   The following studies were reviewed today:   EKG: No EKG today  Recent Labs: 05/19/2021: B Natriuretic Peptide 124.1 02/24/2022: TSH 14.36 03/25/2022: Magnesium 1.8 05/10/2022: ALT 60; BUN 31; Creatinine 0.98; Hemoglobin 11.1; Platelet Count 237; Potassium 4.2; Sodium 131  Recent Lipid Panel    Component Value Date/Time   CHOL 102 10/28/2021 1001   TRIG 64 10/28/2021 1001   HDL 45 10/28/2021 1001   CHOLHDL 2.3 10/28/2021 1001   CHOLHDL 2.1 02/24/2021 1037   VLDL 18 02/24/2021 1037   LDLCALC 43 10/28/2021 1001    Risk  Assessment/Calculations:   CHA2DS2-VASc Score = 4   This indicates a 4.8% annual risk of stroke. The patient's score is based upon: CHF History: 1 HTN History: 1 Diabetes History: 0 Stroke History: 0 Vascular Disease History: 0 Age Score: 2 Gender Score: 0     Home Medications   Current Meds  Medication Sig   acetaminophen (TYLENOL) 325 MG tablet Take 2 tablets (650 mg total) by mouth every 6 (six) hours as needed for mild pain (or Fever >/= 101).   albuterol (VENTOLIN HFA) 108 (90 Base) MCG/ACT inhaler Inhale 2 puffs into the lungs every 6 (six) hours as needed for wheezing or shortness of breath.   alfuzosin (UROXATRAL) 10 MG 24 hr tablet TAKE 1 TABLET AT BEDTIME (Patient taking differently: Take 10 mg by mouth at bedtime.)   allopurinol (ZYLOPRIM) 100 MG tablet Take 1 tablet (100 mg total) by mouth 2 (two) times daily.   atorvastatin (LIPITOR) 80 MG tablet Take 80 mg by mouth at bedtime.   buPROPion (WELLBUTRIN XL) 300 MG 24 hr tablet TAKE 1 TABLET EVERY DAY WITH BREAKFAST (Patient taking differently: Take 300 mg by mouth daily with breakfast.)   Calcium Citrate-Vitamin D (CALCIUM CITRATE + D PO) Take 1 tablet by mouth 2 (two) times daily. '1200mg'$  of Calcium and 1000units vitamin D3   Cholecalciferol (VITAMIN D-3 PO) Take 1 capsule by mouth 2 (two) times daily.   clonazePAM (KLONOPIN) 1 MG tablet Take 1 tablet (1 mg total) by mouth every 6 (six) hours as needed for anxiety. Take 1 tablet (1 mg total) by mouth every 6 (six) hours as needed. for anxiety Strength: 1 mg (Patient taking differently: Take 1 mg by mouth See admin instructions. Take one tablet (1 mg) by mouth daily with lunch, may also take one tablet (1 mg) at bedtime as needed for sleep)   CRANBERRY PO Take 2 tablets by mouth every morning.   denosumab (PROLIA) 60 MG/ML SOSY injection Inject 60 mg as directed every 6 (six) months.   dexamethasone (DECADRON) 4 MG tablet Take 5 tablets (20 mg total) by mouth daily. Start the  day after carboplatin chemotherapy for 3 days. (Patient taking differently: Take 20 mg by mouth See admin instructions. Take 5 tablets (20 mg) by mouth on days 1-3 after carboplatin chemotherapy)  Docusate Sodium 100 MG capsule Take 100 mg by mouth 2 (two) times daily.   DULoxetine (CYMBALTA) 60 MG capsule TAKE 1 CAPSULE TWICE DAILY (Patient taking differently: Take 60 mg by mouth See admin instructions. Take one capsule (60 mg) by mouth twice daily - with lunch and supper)   EPINEPHrine 0.3 mg/0.3 mL IJ SOAJ injection Inject 0.3 mg into the muscle once as needed for anaphylaxis (severe allergic reaction).   ezetimibe (ZETIA) 10 MG tablet TAKE 1 TABLET EVERY DAY (Patient taking differently: Take 10 mg by mouth every morning.)   feeding supplement (ENSURE ENLIVE / ENSURE PLUS) LIQD Take 237 mLs by mouth 2 (two) times daily between meals. (Patient taking differently: Take 237 mLs by mouth daily.)   ferrous sulfate 325 (65 FE) MG tablet Take 325 mg by mouth 2 (two) times daily with a meal.    HYDROcodone-acetaminophen (NORCO) 7.5-325 MG tablet Take 1 tablet by mouth every 6 (six) hours as needed for severe pain.   levothyroxine (SYNTHROID) 100 MCG tablet Take 100 mcg by mouth daily before breakfast.   lidocaine-prilocaine (EMLA) cream Apply 1 Application topically as needed.   loratadine (CLARITIN) 10 MG tablet Take 10 mg by mouth every morning.   Magnesium 500 MG CAPS Take 1 capsule (500 mg total) by mouth daily. (Patient taking differently: Take 500 mg by mouth daily with supper.)   Multiple Vitamin (MULTIVITAMIN WITH MINERALS) TABS tablet Take 1 tablet by mouth every morning. Men's One-A-Day 50+   MYRBETRIQ 25 MG TB24 tablet TAKE 1 TABLET EVERY DAY (Patient taking differently: Take 25 mg by mouth at bedtime.)   omeprazole (PRILOSEC) 40 MG capsule TAKE 1 CAPSULE EVERY DAY BEFORE BREAKFAST (Patient taking differently: Take 40 mg by mouth every morning.)   ondansetron (ZOFRAN) 8 MG tablet Take 1 tablet  (8 mg total) by mouth 2 (two) times daily as needed. Start on the 3rd day after cisplatin chemotherapy. (Patient taking differently: Take 8 mg by mouth 2 (two) times daily as needed for nausea or vomiting. Start on the 3rd day after cisplatin chemotherapy.)   polyethylene glycol (MIRALAX / GLYCOLAX) 17 g packet Take 17 g by mouth daily.   Potassium Citrate 15 MEQ (1620 MG) TBCR TAKE 1 TABLET TWICE DAILY (Patient taking differently: Take 1 tablet by mouth See admin instructions. Take one tablet (15 meq) by mouth twice daily - with lunch and supper)   Probiotic Product (PROBIOTIC PO) Take 1 capsule by mouth daily with breakfast.   prochlorperazine (COMPAZINE) 10 MG tablet Take 1 tablet (10 mg total) by mouth every 6 (six) hours as needed (Nausea or vomiting).   solifenacin (VESICARE) 10 MG tablet Take 10 mg by mouth daily with supper.   testosterone enanthate (DELATESTRYL) 200 MG/ML injection INJECT 1ML ('200MG'$ ) INTO THE MUSCLE EVERY 14 DAYS. DISCARD VIAL AFTER 28 DAYS. (Patient taking differently: Inject 100 mg into the muscle every 14 (fourteen) days. Every other Friday)   vitamin C (ASCORBIC ACID) 500 MG tablet Take 500 mg by mouth daily with lunch.   [DISCONTINUED] diltiazem (CARDIZEM CD) 180 MG 24 hr capsule Take 1 capsule by mouth daily.   [DISCONTINUED] metoprolol tartrate (LOPRESSOR) 25 MG tablet Take 1 tablet by mouth every morning.   [DISCONTINUED] nitroGLYCERIN (NITROSTAT) 0.4 MG SL tablet Place 0.4 mg under the tongue every 5 (five) minutes as needed for chest pain (max 3 doses).     Review of Systems      All other systems reviewed and are otherwise negative except  as noted above.  Physical Exam    VS:  BP 118/72   Pulse (!) 102   Ht '6\' 3"'$  (1.905 m)   Wt 257 lb 12.8 oz (116.9 kg)   BMI 32.22 kg/m  , BMI Body mass index is 32.22 kg/m.  Wt Readings from Last 3 Encounters:  05/17/22 257 lb 12.8 oz (116.9 kg)  05/10/22 259 lb 4 oz (117.6 kg)  05/04/22 258 lb (117 kg)     GEN:  Well nourished, well developed, in no acute distress. HEENT: normal. Neck: Supple, no JVD, carotid bruits, or masses. Cardiac: IRIR, no murmurs, rubs, or gallops. No clubbing, cyanosis, edema.  Radials/PT 2+ and equal bilaterally.  Respiratory:  Respirations regular and unlabored, clear to auscultation bilaterally. GI: Soft, nontender, nondistended. MS: No deformity or atrophy. Skin: Warm and dry, no rash. Neuro:  Strength and sensation are intact. Psych: Normal affect.  Assessment & Plan    Longstanding persistent atrial fibrillation - Xarelto on hold due to thrombocytosis. CHA2DS2-VASc Score = 4 [CHF History: 1, HTN History: 1, Diabetes History: 0, Stroke History: 0, Vascular Disease History: 0, Age Score: 2, Gender Score: 0].  Therefore, the patient's annual risk of stroke is 4.8 %.      At this point I believe the risk still outweighs the benefit.  Will defer to Dr. Irene Limbo when to resume.  Rate controlled with heart rate routinely less than 100 bpm at home per his report.  Continue diltiazem 180 mg daily, metoprolol titrate 25 mg twice daily.  Dose was recently reduced during admission.  B cell lymphoma - Relapse on chemo since 03/2022. Upcoming PET scan for repeat imaging. Follows with Dr. Irene Limbo.   Recurrent UTI - 05/24/22 appt to establish with Dr. Tommy Medal of ID.  Hypothyroidism - Continue to follow with PCP.   CAD -medically managed moderate to severe CAD. Stable with no anginal symptoms. No indication for ischemic evaluation.  GDMT metoprolol, atorvastatin.  Diastolic heart failure - Euvolemic and well compensated on exam.  No indication for loop diuretic at this time.  Continue beta-blocker at present dose.  Hypertension - BP well controlled. Continue current antihypertensive regimen.    Hyperlipidemia, LDL goal less than 70 -continue atorvastatin.  Denies myalgias.   Disposition: Follow up in 3 month(s) with Skeet Latch, MD or APP.  Signed, Loel Dubonnet, NP 05/19/2022,  8:20 AM [place]

## 2022-05-18 ENCOUNTER — Other Ambulatory Visit: Payer: Self-pay

## 2022-05-18 ENCOUNTER — Telehealth: Payer: Self-pay | Admitting: Internal Medicine

## 2022-05-18 DIAGNOSIS — N390 Urinary tract infection, site not specified: Secondary | ICD-10-CM | POA: Diagnosis not present

## 2022-05-18 DIAGNOSIS — R399 Unspecified symptoms and signs involving the genitourinary system: Secondary | ICD-10-CM | POA: Diagnosis not present

## 2022-05-18 NOTE — Telephone Encounter (Signed)
Beth with [facility] calls today in regards to an FYI for PT. PT had a recent fall when attempting to give a urine specimen. PT had fallen on his left side in the bathroom hitting his left lower flank. Fall leaving a minor laceration with minor bleeding. Beth advised PT and significant other to watch PT's left side due to hematoma and to make sure it was to not increase in size.  Beth is asking for approval for zero form gauze and dry dressing for PT.  CB: [phone]

## 2022-05-19 ENCOUNTER — Encounter (HOSPITAL_BASED_OUTPATIENT_CLINIC_OR_DEPARTMENT_OTHER): Payer: Self-pay | Admitting: Family

## 2022-05-19 ENCOUNTER — Encounter (HOSPITAL_COMMUNITY)
Admission: RE | Admit: 2022-05-19 | Discharge: 2022-05-19 | Disposition: A | Discharge status: Home / Self Care | Payer: Medicare PPO | Source: Ambulatory Visit | Attending: Hematology | Admitting: Hematology

## 2022-05-19 DIAGNOSIS — C8330 Diffuse large B-cell lymphoma, unspecified site: Secondary | ICD-10-CM | POA: Diagnosis present

## 2022-05-19 DIAGNOSIS — C8338 Diffuse large B-cell lymphoma, lymph nodes of multiple sites: Secondary | ICD-10-CM | POA: Diagnosis not present

## 2022-05-19 DIAGNOSIS — R918 Other nonspecific abnormal finding of lung field: Secondary | ICD-10-CM | POA: Diagnosis not present

## 2022-05-19 LAB — GLUCOSE, CAPILLARY: Glucose-Capillary: 115 mg/dL — ABNORMAL HIGH (ref 70–99)

## 2022-05-19 MED ORDER — FLUDEOXYGLUCOSE F - 18 (FDG) INJECTION
12.7100 | Freq: Once | INTRAVENOUS | Status: AC | PRN
  Administered 2022-05-19: 12.71 via INTRAVENOUS

## 2022-05-19 NOTE — Telephone Encounter (Signed)
Noted OK Thx 

## 2022-05-20 ENCOUNTER — Encounter: Payer: Self-pay | Admitting: Internal Medicine

## 2022-05-20 ENCOUNTER — Ambulatory Visit: Payer: Medicare PPO | Admitting: Internal Medicine

## 2022-05-20 DIAGNOSIS — I10 Essential (primary) hypertension: Secondary | ICD-10-CM | POA: Diagnosis not present

## 2022-05-20 DIAGNOSIS — I951 Orthostatic hypotension: Secondary | ICD-10-CM

## 2022-05-20 DIAGNOSIS — N390 Urinary tract infection, site not specified: Secondary | ICD-10-CM | POA: Diagnosis not present

## 2022-05-20 DIAGNOSIS — A419 Sepsis, unspecified organism: Secondary | ICD-10-CM

## 2022-05-20 DIAGNOSIS — C8338 Diffuse large B-cell lymphoma, lymph nodes of multiple sites: Secondary | ICD-10-CM | POA: Diagnosis not present

## 2022-05-20 DIAGNOSIS — N3000 Acute cystitis without hematuria: Secondary | ICD-10-CM

## 2022-05-20 DIAGNOSIS — N1831 Chronic kidney disease, stage 3a: Secondary | ICD-10-CM

## 2022-05-20 DIAGNOSIS — R296 Repeated falls: Secondary | ICD-10-CM | POA: Diagnosis not present

## 2022-05-20 MED ORDER — AMOXICILLIN-POT CLAVULANATE 875-125 MG PO TABS: 1.0000 | ORAL_TABLET | Freq: Two times a day (BID) | ORAL | 1 refills | Status: DC

## 2022-05-20 NOTE — Assessment & Plan Note (Signed)
In home PT Try to use Rolator walker w/PT

## 2022-05-20 NOTE — Assessment & Plan Note (Signed)
Hydrate well ?Monitor GFR ?

## 2022-05-20 NOTE — Assessment & Plan Note (Signed)
Recent falls Hydrate well

## 2022-05-20 NOTE — Progress Notes (Signed)
Subjective:  Patient ID: [name], male    DOB: Mar 01, 1946  Age: 76 y.o. MRN: [id number]  CC: No chief complaint on file.   HPI TRACKER [name] presents for UTI/sepsis - recurrent, falls - in PT, lymphoma  Per Hx:  " Admit date:     04/22/2022  Discharge date: 04/27/22  Discharge Physician: [name]    PCP: Joseph Anger, MD    Recommendations at discharge:    Xarelto and aspirin on hold  due to Thrombocytopenia  Needs lab work early next week, if platelet count recovering consider resumption of xarelto.  Follow up on resolution of UTI.  Monitor for orthostatic hypotension., adjustment, resumption of medication HTN, CAD.  Cardizem dose reduce due to orthostatic hypotension.    Discharge Diagnoses: Principal Problem:   Sepsis secondary to UTI [name]) Active Problems:   Hypothyroidism   Essential hypertension   Chronic obstructive pulmonary disease (HCC)   Diffuse large B-cell lymphoma (HCC)   Paroxysmal atrial fibrillation (HCC)   Depression   CAD (coronary artery disease)   OSA (obstructive sleep apnea)   Generalized anxiety disorder   Orthostatic hypotension   Resolved Problems:   * No resolved hospital problems. *   Hospital Course: 76 year old with past medical history significant for hypertension, depression, anxiety, CAD, recurrent diffuse large B-cell lymphoma presents to the ED complaining of fatigue, generalized malaise similar to when he has UTI in the past.  He reports 4 days of fatigue malaise and lightheadedness.  He was found to be hypotensive when EMS arrive.  He received IV fluids.   Evaluation in the ED he was found to have leukocytosis 26,000, lactic acidosis at 4.6.  He received IV fluids and IV antibiotics.   Patient admitted with sepsis secondary to urinary tract infection E. coli ESBL.        Assessment and Plan:   1-Severe Sepsis secondary to urinary tract infection: Patient presented with malaise fatigue, UA with  more than 50 white blood cell, leukocytosis hypotension and lactic acidosis -He received Give IV fluids -Continue with IV fluids maintenance -Urine culture grew E coli ESBL. IV antibiotics change to Meropenem 6/15.--6/18 -Blood culture; no growth to date -Plan to discharge on Nitrofurantoin for three days.    2-CAD: Denies chest pain.  Continue with  statins. Hold aspirin due to thrombocytopenia.    3-Near syncope-Orthostatic Hypotension. Patient develops near syncope episode while trying to walk in the room with RN 6/15. Orthostatic vitals positive.  -Treated  with IV fluids.  -Cortisol level 7 -Reduce Cardizem dose.  -Less symptomatic 7/16.[name] Continue with IV fluids. Ted hose.  Stable, Imdur HCTZ discontinue., Cardizem dose reduce.    4-Diffuse large B-cell lymphoma: - Started chemo with [facility] on 03/22/22 for recurrence  -Needs to follow up with [name].      5-Depression, anxiety: Continue with Wellbutrin, Cymbalta: Klonopin   6-Thrombocytopenia. Suspect related to acute infectious process, antibiotics. Trending down. Hold aspirin.Hold  Xarelto. No evidence of bleeding.   Discussed case with [name], ok to discharge home. Continue to hold aspirin and xarelto. Thrombocytopenia, related to antibiotics. He has appointment next week at cancer center, plan to check labs at that time.      COPD, OSA: Continue with albuterol, CPAP nightly   PAF: Continue with Cardizem as blood pressure allows.  Hold xarelto due to Thrombocytopenia.  Reduce cardizem due to orthostatic hypotension.  Resume metoprolol    Anemia of malignancy. Monitor hb. Hb stable.  Hypertension: Continue to hold blood pressure medications.  He was initially hypotensive. Estimated body mass index is 30.84 kg/m as calculated from the following:   Height as of this encounter: 6' 3.5" (1.918 m).   Weight as of this encounter: 113.4 kg.               Consultants: [name] Procedures performed:  None Disposition: Home Diet recommendation: " Discharge Diet Orders (From admission, onward)        Start     Ordered    04/27/22 0000   Diet - low sodium heart healthy        04/27/22 1009                 Outpatient Medications Prior to Visit  Medication Sig Dispense Refill   acetaminophen (TYLENOL) 325 MG tablet Take 2 tablets (650 mg total) by mouth every 6 (six) hours as needed for mild pain (or Fever >/= 101). 20 tablet    albuterol (VENTOLIN HFA) 108 (90 Base) MCG/ACT inhaler Inhale 2 puffs into the lungs every 6 (six) hours as needed for wheezing or shortness of breath.     alfuzosin (UROXATRAL) 10 MG 24 hr tablet TAKE 1 TABLET AT BEDTIME (Patient taking differently: Take 10 mg by mouth at bedtime.) 90 tablet 3   atorvastatin (LIPITOR) 80 MG tablet Take 80 mg by mouth at bedtime.     buPROPion (WELLBUTRIN XL) 300 MG 24 hr tablet TAKE 1 TABLET EVERY DAY WITH BREAKFAST (Patient taking differently: Take 300 mg by mouth daily with breakfast.) 90 tablet 3   Calcium Citrate-Vitamin D (CALCIUM CITRATE + D PO) Take 1 tablet by mouth 2 (two) times daily. '1200mg'$  of Calcium and 1000units vitamin D3     Cholecalciferol (VITAMIN D-3 PO) Take 1 capsule by mouth 2 (two) times daily.     clonazePAM (KLONOPIN) 1 MG tablet Take 1 tablet (1 mg total) by mouth every 6 (six) hours as needed for anxiety. Take 1 tablet (1 mg total) by mouth every 6 (six) hours as needed. for anxiety Strength: 1 mg (Patient taking differently: Take 1 mg by mouth See admin instructions. Take one tablet (1 mg) by mouth daily with lunch, may also take one tablet (1 mg) at bedtime as needed for sleep) 10 tablet 0   CRANBERRY PO Take 2 tablets by mouth every morning.     denosumab (PROLIA) 60 MG/ML SOSY injection Inject 60 mg as directed every 6 (six) months.     dexamethasone (DECADRON) 4 MG tablet Take 5 tablets (20 mg total) by mouth daily. Start the day after carboplatin chemotherapy for 3 days. (Patient taking  differently: Take 20 mg by mouth See admin instructions. Take 5 tablets (20 mg) by mouth on days 1-3 after carboplatin chemotherapy) 30 tablet 3   diltiazem (CARDIZEM CD) 180 MG 24 hr capsule Take 1 capsule by mouth daily. 90 capsule 3   DULoxetine (CYMBALTA) 60 MG capsule TAKE 1 CAPSULE TWICE DAILY (Patient taking differently: Take 60 mg by mouth See admin instructions. Take one capsule (60 mg) by mouth twice daily - with lunch and supper) 180 capsule 3   EPINEPHrine 0.3 mg/0.3 mL IJ SOAJ injection Inject 0.3 mg into the muscle once as needed for anaphylaxis (severe allergic reaction).     ezetimibe (ZETIA) 10 MG tablet TAKE 1 TABLET EVERY DAY (Patient taking differently: Take 10 mg by mouth every morning.) 90 tablet 2   feeding supplement (ENSURE ENLIVE / ENSURE PLUS) LIQD  Take 237 mLs by mouth 2 (two) times daily between meals. (Patient taking differently: Take 237 mLs by mouth daily.) 237 mL 12   ferrous sulfate 325 (65 FE) MG tablet Take 325 mg by mouth 2 (two) times daily with a meal.      HYDROcodone-acetaminophen (NORCO) 7.5-325 MG tablet Take 1 tablet by mouth every 6 (six) hours as needed for severe pain. 10 tablet 0   levothyroxine (SYNTHROID) 100 MCG tablet Take 100 mcg by mouth daily before breakfast.     lidocaine-prilocaine (EMLA) cream Apply 1 Application topically as needed. 30 g 0   loratadine (CLARITIN) 10 MG tablet Take 10 mg by mouth every morning.     Magnesium 500 MG CAPS Take 1 capsule (500 mg total) by mouth daily. (Patient taking differently: Take 500 mg by mouth daily with supper.) 30 capsule 1   metoprolol tartrate (LOPRESSOR) 25 MG tablet Take 1 tablet (25 mg total) by mouth 2 (two) times daily. 180 tablet 3   Multiple Vitamin (MULTIVITAMIN WITH MINERALS) TABS tablet Take 1 tablet by mouth every morning. Men's One-A-Day 50+     MYRBETRIQ 25 MG TB24 tablet TAKE 1 TABLET EVERY DAY (Patient taking differently: Take 25 mg by mouth at bedtime.) 90 tablet 3   nitroGLYCERIN  (NITROSTAT) 0.4 MG SL tablet Place 1 tablet (0.4 mg total) under the tongue every 5 (five) minutes as needed for chest pain (max 3 doses). 25 tablet 4   omeprazole (PRILOSEC) 40 MG capsule TAKE 1 CAPSULE EVERY DAY BEFORE BREAKFAST (Patient taking differently: Take 40 mg by mouth every morning.) 90 capsule 3   polyethylene glycol (MIRALAX / GLYCOLAX) 17 g packet Take 17 g by mouth daily. 14 each 0   Potassium Citrate 15 MEQ (1620 MG) TBCR TAKE 1 TABLET TWICE DAILY (Patient taking differently: Take 1 tablet by mouth See admin instructions. Take one tablet (15 meq) by mouth twice daily - with lunch and supper) 180 tablet 3   Probiotic Product (PROBIOTIC PO) Take 1 capsule by mouth daily with breakfast.     prochlorperazine (COMPAZINE) 10 MG tablet Take 1 tablet (10 mg total) by mouth every 6 (six) hours as needed (Nausea or vomiting). 30 tablet 1   solifenacin (VESICARE) 10 MG tablet Take 10 mg by mouth daily with supper.     testosterone enanthate (DELATESTRYL) 200 MG/ML injection INJECT 1ML ('200MG'$ ) INTO THE MUSCLE EVERY 14 DAYS. DISCARD VIAL AFTER 28 DAYS. (Patient taking differently: Inject 100 mg into the muscle every 14 (fourteen) days. Every other Friday) 5 mL 3   vitamin C (ASCORBIC ACID) 500 MG tablet Take 500 mg by mouth daily with lunch.     allopurinol (ZYLOPRIM) 100 MG tablet Take 1 tablet (100 mg total) by mouth 2 (two) times daily. (Patient not taking: Reported on 05/20/2022) 60 tablet 0   Docusate Sodium 100 MG capsule Take 100 mg by mouth 2 (two) times daily. (Patient not taking: Reported on 05/20/2022)     ondansetron (ZOFRAN) 8 MG tablet Take 1 tablet (8 mg total) by mouth 2 (two) times daily as needed. Start on the 3rd day after cisplatin chemotherapy. (Patient not taking: Reported on 05/20/2022) 30 tablet 1   No facility-administered medications prior to visit.    ROS: Review of Systems  Constitutional:  Positive for fatigue. Negative for appetite change and unexpected weight change.   HENT:  Negative for congestion, nosebleeds, sneezing, sore throat and trouble swallowing.   Eyes:  Negative for itching and visual disturbance.  Respiratory:  Negative for cough.   Cardiovascular:  Negative for chest pain, palpitations and leg swelling.  Gastrointestinal:  Negative for abdominal distention, blood in stool, diarrhea and nausea.  Genitourinary:  Negative for frequency and hematuria.  Musculoskeletal:  Positive for arthralgias, back pain and gait problem. Negative for joint swelling and neck pain.  Skin:  Positive for color change. Negative for rash.  Neurological:  Positive for weakness and light-headedness. Negative for dizziness, tremors and speech difficulty.  Psychiatric/Behavioral:  Negative for agitation, dysphoric mood, sleep disturbance and suicidal ideas. The patient is nervous/anxious.     Objective:  BP 110/60 (BP Location: Left Arm, Patient Position: Sitting, Cuff Size: Normal)   Pulse 95   Temp 98.3 F (36.8 C) (Oral)   Ht '6\' 3"'$  (1.905 m)   Wt 264 lb (119.7 kg)   SpO2 98%   BMI 33.00 kg/m   BP Readings from Last 3 Encounters:  05/20/22 110/60  05/17/22 118/72  05/11/22 116/61    Wt Readings from Last 3 Encounters:  05/20/22 264 lb (119.7 kg)  05/17/22 257 lb 12.8 oz (116.9 kg)  05/10/22 259 lb 4 oz (117.6 kg)    Physical Exam Constitutional:      General: He is not in acute distress.    Appearance: He is well-developed. He is obese.     Comments: NAD  Eyes:     Conjunctiva/sclera: Conjunctivae normal.     Pupils: Pupils are equal, round, and reactive to light.  Neck:     Thyroid: No thyromegaly.     Vascular: No JVD.  Cardiovascular:     Rate and Rhythm: Normal rate and regular rhythm.     Heart sounds: Normal heart sounds. No murmur heard.    No friction rub. No gallop.  Pulmonary:     Effort: Pulmonary effort is normal. No respiratory distress.     Breath sounds: Normal breath sounds. No wheezing or rales.  Chest:     Chest wall:  No tenderness.  Abdominal:     General: Bowel sounds are normal. There is no distension.     Palpations: Abdomen is soft. There is no mass.     Tenderness: There is no abdominal tenderness. There is no guarding or rebound.  Musculoskeletal:        General: Tenderness present. Normal range of motion.     Cervical back: Normal range of motion.     Right lower leg: Edema present.     Left lower leg: Edema present.  Lymphadenopathy:     Cervical: No cervical adenopathy.  Skin:    General: Skin is warm and dry.     Findings: No rash.  Neurological:     Mental Status: He is alert and oriented to person, place, and time.     Cranial Nerves: No cranial nerve deficit.     Motor: Weakness present. No abnormal muscle tone.     Coordination: Coordination normal.     Gait: Gait abnormal.     Deep Tendon Reflexes: Reflexes are normal and symmetric.  Psychiatric:        Behavior: Behavior normal.        Thought Content: Thought content normal.        Judgment: Judgment normal.   Trace edema bilaterally.  Using a walker  Lab Results  Component Value Date   WBC 3.8 (L) 05/10/2022   HGB 11.1 (L) 05/10/2022   HCT 32.0 (L) 05/10/2022   PLT 237 05/10/2022   GLUCOSE 120 (  H) 05/10/2022   CHOL 102 10/28/2021   TRIG 64 10/28/2021   HDL 45 10/28/2021   LDLCALC 43 10/28/2021   ALT 60 (H) 05/10/2022   AST 28 05/10/2022   NA 131 (L) 05/10/2022   K 4.2 05/10/2022   CL 97 (L) 05/10/2022   CREATININE 0.98 05/10/2022   BUN 31 (H) 05/10/2022   CO2 31 05/10/2022   TSH 14.36 (H) 02/24/2022   PSA 1.08 06/26/2020   INR 1.1 03/06/2022   HGBA1C 5.8 (H) 02/06/2013    NM PET Image Restag (PS) Skull Base To Thigh  Result Date: 05/20/2022 CLINICAL DATA:  Subsequent treatment strategy for large B-cell lymphoma. Status post 3 cycles of chemoimmunotherapy. EXAM: NUCLEAR MEDICINE PET SKULL BASE TO THIGH TECHNIQUE: 12.71 mCi F-18 FDG was injected intravenously. Full-ring PET imaging was performed from the  skull base to thigh after the radiotracer. CT data was obtained and used for attenuation correction and anatomic localization. Fasting blood glucose: 150 mg/dl COMPARISON:  PET-CT 03/02/2022 FINDINGS: Mediastinal blood pool activity: SUV max 2.23 Liver activity: SUV max 3.42 NECK: The large right-sided posterior nasopharyngeal mass has significantly decreased in size since the prior study. Residual lesion measures approximately 19 x 18 mm and previously measured 45 x 24 mm. SUV max is 12.22 (Deauville 5) and was previously 28.49. No residual hypermetabolic lymphadenopathy. There is a persistent large soft tissue mass in the right lower neck measuring 4 cm on image 42/4. SUV max is 2.37 and was previously 2.58. Incidental CT findings: none CHEST: The right lower lobe lung mass is significantly smaller. Previously measured 4.3 cm and now measures 1.8 cm. SUV max is 2.58 (Deauville 3) and was previously 12.3. No enlarged or hypermetabolic mediastinal or hilar lymph nodes and no axillary adenopathy. No new worrisome pulmonary lesions or pulmonary nodules. Incidental CT findings: Stable atherosclerotic calcifications involving the aorta and coronary arteries. ABDOMEN/PELVIS: Significant interval decrease in size of the left pelvic adenopathy. The left obturator node measures 7.5 mm and previously measured 25 mm. SUV max is 5.67 (Deauville 4) was previously 33.49. Resolution of the left inguinal adenopathy seen on the prior study. The largest left inguinal node on the prior study measured 19 mm and had an SUV max of 16.8. This now measures 7 mm and is not demonstrate any hypermetabolism (Deauville 1). No mesenteric or retroperitoneal adenopathy. Normal size spleen. No hypermetabolism. Incidental CT findings: Stable advanced vascular disease. Stable anterior abdominal wall hernia containing fat. SKELETON: Diffuse hypermetabolic bone marrow likely due to chemotherapy or marrow stimulating drugs. There are persistent  cutaneous/subcutaneous lesions in the left hip and buttock area. The largest lesion posterior to the upper gluteus maximus muscle measures 6.2 x 3.0 cm and previously measured 7.6 x 4.0 cm. SUV max is 16.22 (Deauville 5) and was previously 18.45. New diffuse hypermetabolism in the left gluteus minimus and medius muscles possibly related to trauma or injections. I do not see any obvious abnormality on the CT scan. Incidental CT findings: none IMPRESSION: Findings above suggests a good response to treatment as detailed above. The only new findings are muscular uptake in the left gluteal region possibly related to trauma or exercise or injections. Diffuse marrow uptake likely related to chemotherapy or marrow stimulating drugs. Electronically Signed   By: Marijo Sanes M.D.   On: 05/20/2022 13:56    Assessment & Plan:   Problem List Items Addressed This Visit     Diffuse large B-cell lymphoma ([place])      PET CT IMPRESSION:  Findings above suggests a good response to treatment as detailed above.   The only new findings are muscular uptake in the left gluteal region possibly related to trauma or exercise or injections.   Diffuse marrow uptake likely related to chemotherapy or marrow stimulating drugs.     Electronically Signed   By: Marijo Sanes M.D.   On: 05/20/2022 13:56      Relevant Medications   amoxicillin-clavulanate (AUGMENTIN) 875-125 MG tablet   Essential hypertension (Chronic)    BP Readings from Last 3 Encounters:  05/20/22 110/60  05/17/22 118/72  05/11/22 116/61        Falls frequently    In home PT Try to use Rolator walker w/PT      Orthostatic hypotension    Recent falls Hydrate well      Sepsis secondary to UTI [facility])    Recovering       Relevant Orders   Urinalysis   Stage 3a chronic kidney disease (CKD) (HCC) - Baseline Scr 1.5 - 1.6    Hydrate well Monitor GFR      UTI (urinary tract infection)    Recurrent  Timothey was on chronic abx per Urol.  Now he is off abx Recent E coli urosepsis Given Augmentin prn to have on hand 05/2022 Keep OV w/Joseph Tommy Medal         Meds ordered this encounter  Medications   amoxicillin-clavulanate (AUGMENTIN) 875-125 MG tablet    Sig: Take 1 tablet by mouth 2 (two) times daily.    Dispense:  20 tablet    Refill:  1      Follow-up: Return in about 3 months (around 08/20/2022) for a follow-up visit.  Walker Kehr, MD

## 2022-05-20 NOTE — Assessment & Plan Note (Signed)
  PET CT IMPRESSION: Findings above suggests a good response to treatment as detailed above.  The only new findings are muscular uptake in the left gluteal region possibly related to trauma or exercise or injections.  Diffuse marrow uptake likely related to chemotherapy or marrow stimulating drugs.   Electronically Signed   By: Marijo Sanes M.D.   On: 05/20/2022 13:56

## 2022-05-20 NOTE — Assessment & Plan Note (Signed)
Recovering  

## 2022-05-20 NOTE — Assessment & Plan Note (Signed)
BP Readings from Last 3 Encounters:  05/20/22 110/60  05/17/22 118/72  05/11/22 116/61

## 2022-05-20 NOTE — Assessment & Plan Note (Addendum)
Recurrent  [name] was on chronic abx per Urol. Now he is off abx Recent E coli urosepsis Given Augmentin prn to have on hand 05/2022 Keep OV w/Dr Tommy Medal

## 2022-05-21 ENCOUNTER — Other Ambulatory Visit: Payer: Self-pay

## 2022-05-21 ENCOUNTER — Telehealth: Payer: Self-pay | Admitting: Internal Medicine

## 2022-05-21 NOTE — Telephone Encounter (Signed)
Last TSH check on 02/24/22...[name]

## 2022-05-21 NOTE — Telephone Encounter (Signed)
Patient needs a new rx for the 100 mcg levothyroxine - if you want him to stay on this they will need a new rx sent to [facility] Mail order pharmacy - Please let patient know if he is to stay on the 10 mcg.

## 2022-05-24 ENCOUNTER — Telehealth: Payer: Self-pay | Admitting: Internal Medicine

## 2022-05-24 ENCOUNTER — Ambulatory Visit (INDEPENDENT_AMBULATORY_CARE_PROVIDER_SITE_OTHER): Payer: Medicare PPO | Admitting: Infectious Disease

## 2022-05-24 ENCOUNTER — Encounter: Payer: Self-pay | Admitting: Infectious Disease

## 2022-05-24 ENCOUNTER — Other Ambulatory Visit: Payer: Self-pay

## 2022-05-24 VITALS — BP 108/75 | HR 91 | Temp 97.5°F

## 2022-05-24 DIAGNOSIS — N200 Calculus of kidney: Secondary | ICD-10-CM

## 2022-05-24 DIAGNOSIS — Z96653 Presence of artificial knee joint, bilateral: Secondary | ICD-10-CM

## 2022-05-24 DIAGNOSIS — C8330 Diffuse large B-cell lymphoma, unspecified site: Secondary | ICD-10-CM | POA: Insufficient documentation

## 2022-05-24 DIAGNOSIS — Z7189 Other specified counseling: Secondary | ICD-10-CM

## 2022-05-24 DIAGNOSIS — I951 Orthostatic hypotension: Secondary | ICD-10-CM

## 2022-05-24 DIAGNOSIS — I5032 Chronic diastolic (congestive) heart failure: Secondary | ICD-10-CM | POA: Diagnosis not present

## 2022-05-24 DIAGNOSIS — Z1612 Extended spectrum beta lactamase (ESBL) resistance: Secondary | ICD-10-CM | POA: Diagnosis not present

## 2022-05-24 DIAGNOSIS — A499 Bacterial infection, unspecified: Secondary | ICD-10-CM

## 2022-05-24 DIAGNOSIS — R5381 Other malaise: Secondary | ICD-10-CM | POA: Diagnosis not present

## 2022-05-24 DIAGNOSIS — Z87440 Personal history of urinary (tract) infections: Secondary | ICD-10-CM

## 2022-05-24 DIAGNOSIS — Z87891 Personal history of nicotine dependence: Secondary | ICD-10-CM

## 2022-05-24 DIAGNOSIS — Z95828 Presence of other vascular implants and grafts: Secondary | ICD-10-CM

## 2022-05-24 HISTORY — DX: Diffuse large B-cell lymphoma, unspecified site: C83.30

## 2022-05-24 MED ORDER — LEVOTHYROXINE SODIUM 100 MCG PO TABS: 100.0000 ug | ORAL_TABLET | Freq: Every day | ORAL | 3 refills | Status: DC

## 2022-05-24 NOTE — Progress Notes (Signed)
Addendum 05/26/22:  Discussed with Dr. Irene Limbo of hematology "Patient thrombocytopenia thrombocytopenia was related to his ESBL E. coli UTI with sepsis. His thrombocytopenia has since resolved.  He can continue anticoagulation from our standpoint as long as his platelets remain above 75k."  05/10/22 Plt 237. Repeat CBC drawn today - results pending.   CHA2DS2-VASc Score = 4   This indicates a 4.8% annual risk of stroke. The patient's score is based upon: CHF History: 1 HTN History: 1 Diabetes History: 0 Stroke History: 0 Vascular Disease History: 0 Age Score: 2 Gender Score: 0   Will route to pharmacy team for input on dosing. Was previously on Xarelto '10mg'$  which is DVT prevention dose though with known atrial fibrillation and creatinine clearance >50 anticipate '20mg'$  will be needed.   Discussed with pharmacy team.  Creatinine clearance 82 and CHA2DS2-VASc 5.  He should be on 20 mg daily dosing.  We will initiate telephone note and asked nursing staff to contact patient.     Joseph Dubonnet, NP 05/26/2022 9:13 AM

## 2022-05-24 NOTE — Progress Notes (Addendum)
Subjective:  Reason for infectious disease consult patient with recurrent urinary tract infections and ESBL colonization and severe infection  Requesting physician: Sullivan Lone, MD  Primary Care Physician: Lavinia Sharps   Patient ID: [name], male    DOB: 09-30-1946, 76 y.o.   MRN: [id number]  HPI  What is a 76 year old Caucasian man with a history of coronary disease unfortunately recurrent diffuse large B-cell lymphoma, history of renal calculi including a staghorn calculus problems with voiding urinary incontinence who is recently in July admitted to the hospital with severe urinary tract infection due to ESBL.  He was given meropenem in the hospital and then sent home with Macrobid.  He was feeling better while taking the meropenem and Macrobid but then began to feel poorly again with malaise.  Note before he was diagnosed with a severe infection he did not have much in the way of urinary symptoms other than polyuria.  He did not have dysuria or suprapubic pain or clear-cut back pain.  However he had begun to become confused and encephalopathic and was calling the wrong numbers and saying things that did not make sense to his friends.  Ultimately his husband was able to convince him to come in to the ER where he was hospitalized he was seen by his oncologist Dr. Irene Limbo who  was concerned by patient's apparent recurrence of malaise and symptoms and he was initiated on meropenem for an 11-day course.  He was able to get this at home via his port and his husband was able to administer antibiotics for the port after teaching from home health.  He is continuing to struggle with multiple medical problems including orthostatic hypotension deconditioning and frequent falls this past weekend he fell 3 times.  He was given a prescription of Augmentin for presumed urinary tract infection by his primary care physician though is not clear that he has symptoms that are clearly due to urinary  tract infection.  One of the falls he had sounds to have been due to proprioceptive problems he did strike his knee which is has a wound which does not appear overtly infected.  His urine was analyzed at PCPs office but no culture done there is a urine culture done by the home health company which showed E coli    ** S = Susceptible; I = Intermediate; R = Resistant **                    P = Positive; N = Negative             MICS are expressed in micrograms per mL    Antibiotic                 RSLT#1    RSLT#2    RSLT#3    RSLT#4 Amoxicillin/Clavulanic Acid    S Ampicillin                     R Cefazolin                      R Cefepime                       S Ceftriaxone                    R Cefuroxime  R Ciprofloxacin                  R Ertapenem                      S Gentamicin                     R Imipenem                       S Levofloxacin                   R Meropenem                      S Nitrofurantoin                 S Piperacillin/Tazobactam        S Tetracycline                   R Tobramycin                     R Trimethoprim/Sulfa             R     Past Medical History:  Diagnosis Date   Anticoagulant long-term use    xarelto--- takes for AFib,  managed by pcp, Dr Alain Marion   Benign localized prostatic hyperplasia with lower urinary tract symptoms (LUTS)    CAD (coronary artery disease) cardiologist--  dr t. Oval Linsey   hx of known CAD obstruction w/ collaterals (cath done @ [facility] 12-31-2011) ;   last cardiac cath 08-13-2019  showed sig. 2V CAD involving proxLAD and CTO of the RCA/  chronic total occlusion midRCA w/ bridging and L>R collaterals   Diastolic CHF ([place])    dx [id number] hospital admission (followed by pcp)   Diffuse large B cell lymphoma [facility]) oncologist-- dr Irene Limbo--- in remission   dx 08/ 2019 -- bx left tonsill mass-- involving lymph nodes--- completed chemotherapy 10-12-2018   Diffuse large B cell lymphoma ([place]) 05/24/2022   DJD  (degenerative joint disease)    Dyspnea    Dysthymic disorder    Elevated brain natriuretic peptide (BNP) level    Environmental allergies    GAD (generalized anxiety disorder)    GERD (gastroesophageal reflux disease)    History of falling    multiple times   History of Graves' disease    1987 s/p total thyroidectomy   History of kidney stones    History of scabies    07/ 2014  resolved   History of syncope    per D/C note in epic 02-21-2013 ?vasovagal   History of vertebral compression fracture    05/ 2014 --  L1, no surgical intervention   HLD (hyperlipidemia)    Hyperlipidemia    Hypertension    Hypogonadism in male    IDA (iron deficiency anemia)    Lumbago    OA (osteoarthritis)    OSA on CPAP    cpap machine-settings 17   Osteomyelitis of right wrist ([place]) 11/02/2012   Osteoporosis    Severe   Osteoporosis with fracture 04/24/2013   Pathological fracture due to osteoporosis with delayed healing 04/24/2013   Persistent atrial fibrillation [facility])    cardiologist--- dr Alfonso Patten. [place]   Plaque psoriasis    dermatologist--- dr Harriett Sine--- currently taking otezla   Post-surgical hypothyroidism    followed by pcp---  s/p  total thyroidectomy for graves disease in 1987   Pure hypercholesterolemia 02/10/2021   Renal calculus, right    Right femoral fracture ([place]) 09/29/2012   Right forearm fracture 09/29/2012   Right tibial fracture 02/05/2013   Thrombocytopenia ([facility])    Unsteady gait    uses walker   Wears hearing aid in both ears     Past Surgical History:  Procedure Laterality Date   APPENDECTOMY  2011 approx.    CARDIAC CATHETERIZATION  12-31-2011   dr Lenna Sciara. Beatrix Fetters '@[facility]'$    normal LVF w/ multivessel CAD,  occluded RCA w/ colleterals   CATARACT EXTRACTION W/ INTRAOCULAR LENS  IMPLANT, BILATERAL  2016 approx.   COLONOSCOPY     CYSTOSCOPY WITH RETROGRADE PYELOGRAM, URETEROSCOPY AND STENT PLACEMENT Left 11/07/2017   Procedure: CYSTOSCOPY WITH RETROGRADE PYELOGRAM,  URETEROSCOPY AND STENT PLACEMENT;  Surgeon: Cleon Gustin, MD;  Location: [facility];  Service: Urology;  Laterality: Left;   CYSTOSCOPY WITH RETROGRADE PYELOGRAM, URETEROSCOPY AND STENT PLACEMENT Right 10/22/2019   Procedure: CYSTOSCOPY WITH RETROGRADE PYELOGRAM, URETEROSCOPY AND STENT PLACEMENT;  Surgeon: Cleon Gustin, MD;  Location[facility];  Service: Urology;  Laterality: Right;   CYSTOSCOPY/URETEROSCOPY/HOLMIUM LASER/STENT PLACEMENT Right 03/11/2017   Procedure: CYSTOSCOPY/URETEROSCOPYSTENT PLACEMENT right ureter retrograde pylegram;  Surgeon: Cleon Gustin, MD;  [facility];  Service: Urology;  Laterality: Right;   HARDWARE REMOVAL  10/05/2012   Procedure: HARDWARE REMOVAL;  Surgeon: [name], MD;  [facility];  Service: Orthopedics;  Laterality: Right;  REMOVING  STRYKER  GAMMA NAIL   HARDWARE REMOVAL Right 07/03/2013   Procedure: HARDWARE REMOVAL RIGHT TIBIA ;  Surgeon: Rozanna Box, MD;  Location: [place];  Service: Orthopedics;  Laterality: Right;   HIP CLOSED REDUCTION Right 10/15/2013   Procedure: CLOSED MANIPULATION HIP;  Surgeon: [name], MD;  [facility];  Service: Orthopedics;  Laterality: Right;   HOLMIUM LASER APPLICATION Right 10/17/107   Procedure: HOLMIUM LASER APPLICATION;  Surgeon: Cleon Gustin, MD;  [facility];  Service: Urology;  Laterality: Right;   HOLMIUM LASER APPLICATION Left 01/01/5572   Procedure: HOLMIUM LASER APPLICATION;  Surgeon: Cleon Gustin, MD;  [facility];  Service: Urology;  Laterality: Left;   HOLMIUM LASER APPLICATION Left 11/12/252   Procedure: HOLMIUM LASER APPLICATION;  Surgeon: Cleon Gustin, MD;  [facility];  Service: Urology;  Laterality: Left;   HOLMIUM LASER APPLICATION Right 2/70/6237   Procedure: HOLMIUM LASER APPLICATION;  Surgeon: Cleon Gustin, MD;  Location: [facility];  Service: Urology;  Laterality: Right;   INCISION AND  DRAINAGE HIP Right 11/16/2013   Procedure: IRRIGATION AND DEBRIDEMENT RIGHT HIP;  Surgeon: [name], MD;  [facility];  Service: Orthopedics;  Laterality: Right;   INTRAVASCULAR PRESSURE WIRE/FFR STUDY N/A 08/13/2019   Procedure: INTRAVASCULAR PRESSURE WIRE/FFR STUDY;  Surgeon: Nelva Bush, MD;  Location: [place] [facility];  Service: Cardiovascular;  Laterality: N/A;   IR IMAGING GUIDED PORT INSERTION  06/22/2018   IR NEPHROSTOMY PLACEMENT LEFT  03/28/2019   IR URETERAL STENT LEFT NEW ACCESS W/O SEP NEPHROSTOMY CATH  10/24/2017   IR URETERAL STENT LEFT NEW ACCESS W/O SEP NEPHROSTOMY CATH  03/26/2019   NEPHROLITHOTOMY Right 02/08/2017   Procedure: NEPHROLITHOTOMY PERCUTANEOUS WITH SURGEON ACCESS;  Surgeon: Cleon Gustin, MD;  [facility];  Service: Urology;  Laterality: Right;   NEPHROLITHOTOMY Left 10/24/2017   Procedure: NEPHROLITHOTOMY PERCUTANEOUS;  Surgeon: Cleon Gustin, MD;  [facility];  Service: Urology;  Laterality: Left;   NEPHROLITHOTOMY Left 03/26/2019   Procedure: NEPHROLITHOTOMY PERCUTANEOUS;  Surgeon: Cleon Gustin, MD;  [facility];  Service: Urology;  Laterality: Left;  2 HRS   NEPHROLITHOTOMY Left 04/17/2019   Procedure: NEPHROLITHOTOMY PERCUTANEOUS;  Surgeon: Cleon Gustin, MD;  [facility];  Service: Urology;  Laterality: Left;  2 HRS   ORIF TIBIA FRACTURE Right 02/06/2013   Procedure: OPEN REDUCTION INTERNAL FIXATION (ORIF) TIBIA FRACTURE WITH IM ROD FIBULA;  Surgeon: Rozanna Box, MD;  Location: [place];  Service: Orthopedics;  Laterality: Right;   ORIF TIBIA FRACTURE Right 07/03/2013   Procedure: RIGHT TIBIA NON UNION REPAIR ;  Surgeon: Rozanna Box, MD;  Location: [place];  Service: Orthopedics;  Laterality: Right;   ORIF WRIST FRACTURE  10/02/2012   Procedure: OPEN REDUCTION INTERNAL FIXATION (ORIF) WRIST FRACTURE;  Surgeon: Roseanne Kaufman, MD;  [facility];  Service: Orthopedics;  Laterality: Right;  WITH   ANTIBIOTIC   CEMENT   ORIF WRIST FRACTURE Left 10/28/2013   Procedure: OPEN REDUCTION INTERNAL FIXATION (ORIF) WRIST FRACTURE with allograft;  Surgeon: Roseanne Kaufman, MD;  [facility];  Service: Orthopedics;  Laterality: Left;  DVR Plate   QUADRICEPS TENDON REPAIR Left 07/15/2017   Procedure: REPAIR QUADRICEP TENDON;  Surgeon: Frederik Pear, MD;  Location: [place];  Service: Orthopedics;  Laterality: Left;   RIGHT/LEFT HEART CATH AND CORONARY ANGIOGRAPHY N/A 08/13/2019   Procedure: RIGHT/LEFT HEART CATH AND CORONARY ANGIOGRAPHY;  Surgeon: Nelva Bush, MD;  Location: [place] [facility];  Service: Cardiovascular;  Laterality: N/A;   THYROIDECTOMY  02/1986   TOTAL HIP ARTHROPLASTY Right 03-16-2016   '@[facility]'$    TOTAL HIP REVISION  10/05/2012   Procedure: TOTAL HIP REVISION;  Surgeon: [name], MD;  [facility];  Service: Orthopedics;  Laterality: Right;  RIGHT TOTAL HIP REVISION   TOTAL HIP REVISION Right 09/17/2013   Procedure: REVISION RIGHT TOTAL HIP ARTHROPLASTY ;  Surgeon: [name], MD;  [facility];  Service: Orthopedics;  Laterality: Right;   TOTAL HIP REVISION Right 10/26/2013   Procedure: REVISION RIGHT TOTAL HIP ARTHROPLASTY;  Surgeon: [name], MD;  [facility];  Service: Orthopedics;  Laterality: Right;   TOTAL KNEE ARTHROPLASTY Bilateral right 03-15-2011;  left 06-30-2011   TOTAL KNEE REVISION Left 04/11/2017   Procedure: TOTAL KNEE REVISION PATELLA and TIBIA;  Surgeon: Frederik Pear, MD;  Location: [place];  Service: Orthopedics;  Laterality: Left;    Family History  Problem Relation Age of Onset   CAD Father 74   Asthma Father    Alcohol abuse Father    Arthritis Mother    Alcohol abuse Sister       Social History   Socioeconomic History   Marital status: Married    Spouse name: Not on file   Number of children: Not on file   Years of education: Not on file   Highest education level: Not on file  Occupational History   Not on file  Tobacco Use    Smoking status: Former    Packs/day: 1.00    Years: 50.00    Total pack years: 50.00    Types: Cigarettes    Quit date: 01/12/2012    Years since quitting: 10.3   Smokeless tobacco: Never  Vaping Use   Vaping Use: Never used  Substance and Sexual Activity   Alcohol use: Not Currently    Comment: occasional-social  Drug use: Never   Sexual activity: Yes  Other Topics Concern   Not on file  Social History Narrative   [place] 2.5 months, went home Feb 21st slipped and fell on back and developed.  Home PT/OT.  Just started outpatient physical therapy.  Friday night, misstepped.       Pt spouse verbalized pt had fallen 4 times within the last 24 hours     Social Determinants of Health   Financial Resource Strain: Low Risk  (01/07/2022)   Overall Financial Resource Strain (CARDIA)    Difficulty of Paying Living Expenses: Not hard at all  Food Insecurity: No Food Insecurity (01/07/2022)   Hunger Vital Sign    Worried About Running Out of Food in the Last Year: Never true    Ran Out of Food in the Last Year: Never true  Transportation Needs: No Transportation Needs (01/07/2022)   PRAPARE - Hydrologist (Medical): No    Lack of Transportation (Non-Medical): No  Physical Activity: Sufficiently Active (01/07/2022)   Exercise Vital Sign    Days of Exercise per Week: 3 days    Minutes of Exercise per Session: 60 min  Stress: No Stress Concern Present (01/07/2022)   [place]    Feeling of Stress : Not at all  Social Connections: [place] (01/07/2022)   Social Connection and Isolation Panel [NHANES]    Frequency of Communication with Friends and Family: More than three times a week    Frequency of Social Gatherings with Friends and Family: More than three times a week    Attends Religious Services: 1 to 4 times per year    Active Member of [employer] or Organizations: No    Attends Programme researcher, broadcasting/film/video: 1 to 4 times per year    Marital Status: Married    Allergies  Allergen Reactions   Short Ragweed Pollen Ext Cough     Current Outpatient Medications:    acetaminophen (TYLENOL) 325 MG tablet, Take 2 tablets (650 mg total) by mouth every 6 (six) hours as needed for mild pain (or Fever >/= 101)., Disp: 20 tablet, Rfl:    albuterol (VENTOLIN HFA) 108 (90 Base) MCG/ACT inhaler, Inhale 2 puffs into the lungs every 6 (six) hours as needed for wheezing or shortness of breath., Disp: , Rfl:    alfuzosin (UROXATRAL) 10 MG 24 hr tablet, TAKE 1 TABLET AT BEDTIME (Patient taking differently: Take 10 mg by mouth at bedtime.), Disp: 90 tablet, Rfl: 3   amoxicillin-clavulanate (AUGMENTIN) 875-125 MG tablet, Take 1 tablet by mouth 2 (two) times daily., Disp: 20 tablet, Rfl: 1   atorvastatin (LIPITOR) 80 MG tablet, Take 80 mg by mouth at bedtime., Disp: , Rfl:    buPROPion (WELLBUTRIN XL) 300 MG 24 hr tablet, TAKE 1 TABLET EVERY DAY WITH BREAKFAST (Patient taking differently: Take 300 mg by mouth daily with breakfast.), Disp: 90 tablet, Rfl: 3   Calcium Citrate-Vitamin D (CALCIUM CITRATE + D PO), Take 1 tablet by mouth 2 (two) times daily. '1200mg'$  of Calcium and 1000units vitamin D3, Disp: , Rfl:    Cholecalciferol (VITAMIN D-3 PO), Take 1 capsule by mouth 2 (two) times daily., Disp: , Rfl:    clonazePAM (KLONOPIN) 1 MG tablet, Take 1 tablet (1 mg total) by mouth every 6 (six) hours as needed for anxiety. Take 1 tablet (1 mg total) by mouth every 6 (six) hours as needed. for anxiety Strength: 1  mg (Patient taking differently: Take 1 mg by mouth See admin instructions. Take one tablet (1 mg) by mouth daily with lunch, may also take one tablet (1 mg) at bedtime as needed for sleep), Disp: 10 tablet, Rfl: 0   CRANBERRY PO, Take 2 tablets by mouth every morning., Disp: , Rfl:    denosumab (PROLIA) 60 MG/ML SOSY injection, Inject 60 mg as directed every 6 (six) months., Disp: , Rfl:     dexamethasone (DECADRON) 4 MG tablet, Take 5 tablets (20 mg total) by mouth daily. Start the day after carboplatin chemotherapy for 3 days. (Patient taking differently: Take 20 mg by mouth See admin instructions. Take 5 tablets (20 mg) by mouth on days 1-3 after carboplatin chemotherapy), Disp: 30 tablet, Rfl: 3   diltiazem (CARDIZEM CD) 180 MG 24 hr capsule, Take 1 capsule by mouth daily., Disp: 90 capsule, Rfl: 3   DULoxetine (CYMBALTA) 60 MG capsule, TAKE 1 CAPSULE TWICE DAILY (Patient taking differently: Take 60 mg by mouth See admin instructions. Take one capsule (60 mg) by mouth twice daily - with lunch and supper), Disp: 180 capsule, Rfl: 3   EPINEPHrine 0.3 mg/0.3 mL IJ SOAJ injection, Inject 0.3 mg into the muscle once as needed for anaphylaxis (severe allergic reaction)., Disp: , Rfl:    ezetimibe (ZETIA) 10 MG tablet, TAKE 1 TABLET EVERY DAY (Patient taking differently: Take 10 mg by mouth every morning.), Disp: 90 tablet, Rfl: 2   feeding supplement (ENSURE ENLIVE / ENSURE PLUS) LIQD, Take 237 mLs by mouth 2 (two) times daily between meals. (Patient taking differently: Take 237 mLs by mouth daily.), Disp: 237 mL, Rfl: 12   ferrous sulfate 325 (65 FE) MG tablet, Take 325 mg by mouth 2 (two) times daily with a meal. , Disp: , Rfl:    HYDROcodone-acetaminophen (NORCO) 7.5-325 MG tablet, Take 1 tablet by mouth every 6 (six) hours as needed for severe pain., Disp: 10 tablet, Rfl: 0   levothyroxine (SYNTHROID) 100 MCG tablet, Take 1 tablet (100 mcg total) by mouth daily before breakfast., Disp: 90 tablet, Rfl: 3   lidocaine-prilocaine (EMLA) cream, Apply 1 Application topically as needed., Disp: 30 g, Rfl: 0   loratadine (CLARITIN) 10 MG tablet, Take 10 mg by mouth every morning., Disp: , Rfl:    Magnesium 500 MG CAPS, Take 1 capsule (500 mg total) by mouth daily. (Patient taking differently: Take 500 mg by mouth daily with supper.), Disp: 30 capsule, Rfl: 1   metoprolol tartrate (LOPRESSOR) 25 MG  tablet, Take 1 tablet (25 mg total) by mouth 2 (two) times daily., Disp: 180 tablet, Rfl: 3   Multiple Vitamin (MULTIVITAMIN WITH MINERALS) TABS tablet, Take 1 tablet by mouth every morning. Men's One-A-Day 50+, Disp: , Rfl:    MYRBETRIQ 25 MG TB24 tablet, TAKE 1 TABLET EVERY DAY (Patient taking differently: Take 25 mg by mouth at bedtime.), Disp: 90 tablet, Rfl: 3   nitroGLYCERIN (NITROSTAT) 0.4 MG SL tablet, Place 1 tablet (0.4 mg total) under the tongue every 5 (five) minutes as needed for chest pain (max 3 doses)., Disp: 25 tablet, Rfl: 4   omeprazole (PRILOSEC) 40 MG capsule, TAKE 1 CAPSULE EVERY DAY BEFORE BREAKFAST (Patient taking differently: Take 40 mg by mouth every morning.), Disp: 90 capsule, Rfl: 3   ondansetron (ZOFRAN) 8 MG tablet, Take 1 tablet (8 mg total) by mouth 2 (two) times daily as needed. Start on the 3rd day after cisplatin chemotherapy., Disp: 30 tablet, Rfl: 1   polyethylene glycol (  MIRALAX / GLYCOLAX) 17 g packet, Take 17 g by mouth daily., Disp: 14 each, Rfl: 0   Potassium Citrate 15 MEQ (1620 MG) TBCR, TAKE 1 TABLET TWICE DAILY (Patient taking differently: Take 1 tablet by mouth See admin instructions. Take one tablet (15 meq) by mouth twice daily - with lunch and supper), Disp: 180 tablet, Rfl: 3   Probiotic Product (PROBIOTIC PO), Take 1 capsule by mouth daily with breakfast., Disp: , Rfl:    prochlorperazine (COMPAZINE) 10 MG tablet, Take 1 tablet (10 mg total) by mouth every 6 (six) hours as needed (Nausea or vomiting)., Disp: 30 tablet, Rfl: 1   solifenacin (VESICARE) 10 MG tablet, Take 10 mg by mouth daily with supper., Disp: , Rfl:    testosterone enanthate (DELATESTRYL) 200 MG/ML injection, INJECT 1ML ('200MG'$ ) INTO THE MUSCLE EVERY 14 DAYS. DISCARD VIAL AFTER 28 DAYS. (Patient taking differently: Inject 100 mg into the muscle every 14 (fourteen) days. Every other Friday), Disp: 5 mL, Rfl: 3   vitamin C (ASCORBIC ACID) 500 MG tablet, Take 500 mg by mouth daily with  lunch., Disp: , Rfl:    allopurinol (ZYLOPRIM) 100 MG tablet, Take 1 tablet (100 mg total) by mouth 2 (two) times daily. (Patient not taking: Reported on 05/20/2022), Disp: 60 tablet, Rfl: 0   Docusate Sodium 100 MG capsule, Take 100 mg by mouth 2 (two) times daily. (Patient not taking: Reported on 05/20/2022), Disp: , Rfl:    Review of Systems  Constitutional:  Positive for activity change, appetite change and fatigue. Negative for chills, diaphoresis, fever and unexpected weight change.  HENT:  Negative for congestion, rhinorrhea, sinus pressure, sneezing, sore throat and trouble swallowing.   Eyes:  Negative for photophobia and visual disturbance.  Respiratory:  Negative for cough, chest tightness, shortness of breath, wheezing and stridor.   Cardiovascular:  Negative for chest pain, palpitations and leg swelling.  Gastrointestinal:  Negative for abdominal distention, abdominal pain, anal bleeding, blood in stool, constipation, diarrhea, nausea and vomiting.  Genitourinary:  Positive for urgency. Negative for difficulty urinating, dysuria, flank pain and hematuria.  Musculoskeletal:  Negative for arthralgias, back pain, gait problem, joint swelling and myalgias.  Skin:  Negative for color change, pallor, rash and wound.  Neurological:  Positive for dizziness. Negative for tremors, weakness and light-headedness.  Hematological:  Negative for adenopathy. Does not bruise/bleed easily.  Psychiatric/Behavioral:  Negative for agitation, behavioral problems, confusion, decreased concentration, dysphoric mood and sleep disturbance.        Objective:   Physical Exam Constitutional:      Appearance: He is well-developed.  HENT:     Head: Normocephalic and atraumatic.  Eyes:     Conjunctiva/sclera: Conjunctivae normal.  Cardiovascular:     Rate and Rhythm: Normal rate and regular rhythm.  Pulmonary:     Effort: Pulmonary effort is normal. No respiratory distress.     Breath sounds: No  wheezing.  Abdominal:     General: There is no distension.     Palpations: Abdomen is soft.  Musculoskeletal:        General: No tenderness. Normal range of motion.     Cervical back: Normal range of motion and neck supple.  Skin:    General: Skin is warm and dry.     Coloration: Skin is not pale.     Findings: No erythema or rash.  Neurological:     General: No focal deficit present.     Mental Status: He is alert and oriented to person,  place, and time.  Psychiatric:        Mood and Affect: Mood normal.        Behavior: Behavior normal.        Thought Content: Thought content normal.        Judgment: Judgment normal.    Left knee 05/24/2022:          Assessment & Plan:   Recurrent UTI's:  Difficult to pin down when he is actually having a UTI and when we are dealing with colonization with him being orthostatic, weak, deconditioned or other reason for him to feel poorly  Being said I am a bit nervous about his recent falls over the weekend and whether he could be developing a more significant infection now.  I will endeavor to set him up with IV ertapenem to give him a 10-day course of this.  In general the best approach however to get away from dealing with resistant organisms of the 1 that has been dealing with in his bladder recently is  See if there is an underlying treatable structural reason for the recurrent infections such as kidney stone difficulty moving urine and voiding his bladder.  Indeed on CT and done in February he did have bilateral calculi in each kidney  Otherwise restraint is actually one of the best things we can exercise in terms of hoping that his urine gets colonized by last resistant organism.  Note sensitive on the recent E. coli is showing it to be it sensitive to amp sulbactam though I am skeptical that this is a antibiotic that should be used certainly not if this is an ESBL but it may just be MDR  I spent 90  minutes with the patient  including than 50% of the time in face to face counseling of the patient and husband doing differences between bacteriuria asymptomatic bacteriuria urinary tract infection and typical symptoms and symptoms that are more concerning, along with review of medical records in preparation for the visit and during the visit and in coordination of his care.  Diagnosis: Multidrug-resistant E. coli, ESBL urinary tract infection  Culture Result: ESBL  Allergies  Allergen Reactions   Short Ragweed Pollen Ext Cough    OPAT Orders Discharge antibiotics to be given via PICC line Discharge antibiotics: Ertapenem 1 g IV daily Duration: 14 days End Date: 06/07/2022  [facility] care per protocol Home health RN for IV administration and teaching [place] line care and labs.    Labs weekly while on IV antibiotics: _x_ CBC with differential  _x_ CMP  Please give 1 liter bolus NS at first visit  Fax weekly labs to [phone]

## 2022-05-24 NOTE — Telephone Encounter (Signed)
Okay.  Done, thanks

## 2022-05-24 NOTE — Telephone Encounter (Signed)
Noted OV if not better Use a walker Thx

## 2022-05-24 NOTE — Telephone Encounter (Signed)
MD sent rx to [facility].Marland KitchenJohny Chess

## 2022-05-24 NOTE — Telephone Encounter (Signed)
Beth from [facility] states pt fell 3 times over weekend, did not hit his head, but has some bruising on his R Ankle and L Hip. He is still very weak in his legs.   Pt still has an UTI and has started taking amoxicillin-clavulanate (AUGMENTIN) 500-125 MG tablet for it but there is still some slight burning   Pt also has diminished lung capacity in his Right, mid and lower lobe. He has been coughing and there has been some clear/ yellowish mucus, and a slight shortness of breath.  Beth: [id number]

## 2022-05-25 ENCOUNTER — Telehealth: Payer: Self-pay

## 2022-05-25 ENCOUNTER — Encounter: Payer: Self-pay | Admitting: Hematology

## 2022-05-25 DIAGNOSIS — A419 Sepsis, unspecified organism: Secondary | ICD-10-CM | POA: Diagnosis not present

## 2022-05-25 DIAGNOSIS — N390 Urinary tract infection, site not specified: Secondary | ICD-10-CM | POA: Diagnosis not present

## 2022-05-25 LAB — COMPLETE METABOLIC PANEL WITH GFR
AG Ratio: 1.6 (calc) (ref 1.0–2.5)
ALT: 24 U/L (ref 9–46)
AST: 24 U/L (ref 10–35)
Albumin: 3.7 g/dL (ref 3.6–5.1)
Alkaline phosphatase (APISO): 107 U/L (ref 35–144)
BUN/Creatinine Ratio: 13 (calc) (ref 6–22)
BUN: 17 mg/dL (ref 7–25)
CO2: 26 mmol/L (ref 20–32)
Calcium: 9.4 mg/dL (ref 8.6–10.3)
Chloride: 98 mmol/L (ref 98–110)
Creat: 1.3 mg/dL — ABNORMAL HIGH (ref 0.70–1.28)
Globulin: 2.3 g/dL (calc) (ref 1.9–3.7)
Glucose, Bld: 55 mg/dL — ABNORMAL LOW (ref 65–99)
Potassium: 3.9 mmol/L (ref 3.5–5.3)
Sodium: 141 mmol/L (ref 135–146)
Total Bilirubin: 0.8 mg/dL (ref 0.2–1.2)
Total Protein: 6 g/dL — ABNORMAL LOW (ref 6.1–8.1)
eGFR: 57 mL/min/{1.73_m2} — ABNORMAL LOW (ref >=60)

## 2022-05-25 LAB — CBC WITH DIFFERENTIAL/PLATELET
Absolute Monocytes: 1267 cells/uL — ABNORMAL HIGH (ref 200–950)
Basophils Absolute: 60 cells/uL (ref 0–200)
Basophils Relative: 0.4 %
Eosinophils Absolute: 313 cells/uL (ref 15–500)
Eosinophils Relative: 2.1 %
HCT: 35.4 % — ABNORMAL LOW (ref 38.5–50.0)
Hemoglobin: 12 g/dL — ABNORMAL LOW (ref 13.2–17.1)
Lymphs Abs: 1207 cells/uL (ref 850–3900)
MCH: 35 pg — ABNORMAL HIGH (ref 27.0–33.0)
MCHC: 33.9 g/dL (ref 32.0–36.0)
MCV: 103.2 fL — ABNORMAL HIGH (ref 80.0–100.0)
MPV: 10.6 fL (ref 7.5–12.5)
Monocytes Relative: 8.5 %
Neutro Abs: 12054 cells/uL — ABNORMAL HIGH (ref 1500–7800)
Neutrophils Relative %: 80.9 %
Platelets: 194 10*3/uL (ref 140–400)
RBC: 3.43 10*6/uL — ABNORMAL LOW (ref 4.20–5.80)
RDW: 20.9 % — ABNORMAL HIGH (ref 11.0–15.0)
Total Lymphocyte: 8.1 %
WBC: 14.9 10*3/uL — ABNORMAL HIGH (ref 3.8–10.8)

## 2022-05-25 LAB — SPECIMEN COMPROMISED

## 2022-05-25 NOTE — Telephone Encounter (Signed)
New OPAT orders per Dr. Tommy Medal, orders shared with Carolynn Sayers, RN at [facility] staff.   IR appointment: No IR appointment needed - patient currently has a port - Carolynn Sayers, RN at [facility] aware.  First dose: no need for short stay per provider.  Per Dr.Van Dam patient has had meropenem in the past. Patient will not need short stay for IV ertapenem.     [place], CMA

## 2022-05-25 NOTE — Telephone Encounter (Signed)
Notified Beth w/MD response,.Marland KitchenJohny Lane

## 2022-05-26 ENCOUNTER — Telehealth (HOSPITAL_BASED_OUTPATIENT_CLINIC_OR_DEPARTMENT_OTHER): Payer: Self-pay | Admitting: Family

## 2022-05-26 ENCOUNTER — Ambulatory Visit: Payer: Self-pay | Admitting: Licensed Clinical Social Worker

## 2022-05-26 DIAGNOSIS — Z5181 Encounter for therapeutic drug level monitoring: Secondary | ICD-10-CM

## 2022-05-26 MED ORDER — RIVAROXABAN 20 MG PO TABS: 20.0000 mg | ORAL_TABLET | Freq: Every day | ORAL | 3 refills | Status: DC

## 2022-05-26 NOTE — Patient Outreach (Signed)
  Care Coordination   Initial Visit Note   05/26/2022 Name: [name] MRN: [id number] DOB: [birth year]  [name] is a 76 y.o. year old male who sees Plotnikov, Evie Lacks, MD for primary care. I spoke with  [name] / spouse of client, [name] phone today  What matters to the patients health and wellness today?  Client has weekly nurse visits at present Has transport help with his spouse. Client has medications. Program may be something client could benefit from in the future.     Goals Addressed             This Visit's Progress    Patient has support from spouse. Patient has weekly nurse visits at present.       Marland KitchenMarland KitchenMarland KitchenCare Coordination Interventions:  Active listening / Reflection utilized  Described Care Coordination program support with [name], spouse of client Reviewed care needs of client. [name] said client has nurse who visits client weekly at this time.  [name] helps transport client to and from client medical appointments Reviewed medication procurement of client [name] was glad to hear of this program, will share information on program with client, and said program may be something that client  could benefit from in the future     SDOH assessments and interventions completed:  No     Care Coordination Interventions Activated:  No  Care Coordination Interventions:  No, not indicated   Follow up plan: No further intervention required.   Encounter Outcome:  Pt. Visit Completed

## 2022-05-26 NOTE — Addendum Note (Signed)
Addended by: Gerald Stabs on: 05/26/2022 09:36 AM   Modules accepted: Orders

## 2022-05-26 NOTE — Patient Instructions (Addendum)
Visit Information  Thank you for taking time to visit with me today. Please don't hesitate to contact me if I can be of assistance to you before our next scheduled telephone appointment.  Following are the goals we discussed today:   No further intervention is needed at present  Please call the care guide team at [phone] if you need to cancel or reschedule your appointment.   If you are experiencing a Mental Health or [place] or need someone to talk to, please go to [facility] [phone])   Following is a copy of your full plan of care:   Care Coordination Interventions:  Active listening / Reflection utilized  Described Care Coordination program support with Rush Farmer, spouse of client Reviewed care needs of client. [name] said client has nurse who visits client weekly at this time.  [name] helps transport client to and from client medical appointments Reviewed medication procurement of client Rush Farmer was glad to hear of this program, will share information on program with client, and said program may be something that client  could benefit from in the future  Mr. Dreisbach was given information about Care Management services by the embedded care coordination team including:  Care Management services include personalized support from designated clinical staff supervised by his physician, including individualized plan of care and coordination with other care providers 24/7 contact phone numbers for assistance for urgent and routine care needs. The patient may stop CCM services at any time (effective at the end of the month) by phone call to the office staff.  Patient agreed to services and verbal consent obtained.   Norva Riffle.Petula Rotolo MSW, [place] representative [facility] Care Management [phone]

## 2022-05-26 NOTE — Telephone Encounter (Signed)
RN called patient and spoke with patient's husband to Start Xarelto '20mg'$ , rx sent to pharmacy and labs mailed to patient.      "Discussed with Dr. Irene Limbo of hematology. As platelet count improved okay to resume anticoagulation. Please resume Xarelto '20mg'$  daily. Reviewed with pharmacy and this is the appropriate dose for his renal function. Repeat CBC in 1 month for monitoring. "

## 2022-05-26 NOTE — Telephone Encounter (Signed)
Anticoagulation held during 04/2022 admission due to thrombocytopenia.   Discussed with Dr. Irene Limbo of hematology "Patient thrombocytopenia thrombocytopenia was related to his ESBL E. coli UTI with sepsis. His thrombocytopenia has since resolved.  He can continue anticoagulation from our standpoint as long as his platelets remain above 75k."  05/10/22 Plt 237.  05/24/22  Plt 194  Discussed with pharmacy team to clarify dose.  Prior to hospitalization was on Xarelto 10 mg which is DVT prevention dose that with known atrial fibrillation and creatinine clearance greater than 50 and appropriate dose.   Discussed with pharmacy team.  Creatinine clearance 82 and CHA2DS2-VASc 5.  Resume Xarelto 20 mg daily.  Repeat CBC in 1 month.     Loel Dubonnet, NP

## 2022-05-28 ENCOUNTER — Other Ambulatory Visit: Payer: Self-pay | Admitting: *Deleted

## 2022-05-28 DIAGNOSIS — C8330 Diffuse large B-cell lymphoma, unspecified site: Secondary | ICD-10-CM

## 2022-05-31 ENCOUNTER — Other Ambulatory Visit: Payer: Self-pay

## 2022-05-31 ENCOUNTER — Ambulatory Visit: Payer: Medicare PPO | Admitting: Hematology

## 2022-05-31 ENCOUNTER — Inpatient Hospital Stay: Payer: Medicare PPO

## 2022-05-31 ENCOUNTER — Other Ambulatory Visit: Payer: Medicare PPO

## 2022-05-31 ENCOUNTER — Inpatient Hospital Stay (HOSPITAL_BASED_OUTPATIENT_CLINIC_OR_DEPARTMENT_OTHER): Payer: Medicare PPO | Admitting: Hematology

## 2022-05-31 VITALS — BP 119/68 | HR 85 | Temp 97.7°F | Resp 16 | Wt 252.0 lb

## 2022-05-31 DIAGNOSIS — R627 Adult failure to thrive: Secondary | ICD-10-CM | POA: Diagnosis not present

## 2022-05-31 DIAGNOSIS — C8330 Diffuse large B-cell lymphoma, unspecified site: Secondary | ICD-10-CM | POA: Diagnosis not present

## 2022-05-31 DIAGNOSIS — N390 Urinary tract infection, site not specified: Secondary | ICD-10-CM | POA: Diagnosis not present

## 2022-05-31 DIAGNOSIS — N189 Chronic kidney disease, unspecified: Secondary | ICD-10-CM | POA: Diagnosis not present

## 2022-05-31 DIAGNOSIS — E860 Dehydration: Secondary | ICD-10-CM | POA: Diagnosis not present

## 2022-05-31 DIAGNOSIS — R4182 Altered mental status, unspecified: Secondary | ICD-10-CM | POA: Diagnosis not present

## 2022-05-31 DIAGNOSIS — Z5189 Encounter for other specified aftercare: Secondary | ICD-10-CM | POA: Diagnosis not present

## 2022-05-31 DIAGNOSIS — Z87440 Personal history of urinary (tract) infections: Secondary | ICD-10-CM | POA: Diagnosis not present

## 2022-05-31 DIAGNOSIS — B9620 Unspecified Escherichia coli [E. coli] as the cause of diseases classified elsewhere: Secondary | ICD-10-CM | POA: Diagnosis not present

## 2022-05-31 DIAGNOSIS — C8339 Diffuse large B-cell lymphoma, extranodal and solid organ sites: Secondary | ICD-10-CM | POA: Diagnosis not present

## 2022-05-31 LAB — CBC WITH DIFFERENTIAL (CANCER CENTER ONLY)
Abs Immature Granulocytes: 0.79 10*3/uL — ABNORMAL HIGH (ref 0.00–0.07)
Basophils Absolute: 0.1 10*3/uL (ref 0.0–0.1)
Basophils Relative: 1 %
Eosinophils Absolute: 0.1 10*3/uL (ref 0.0–0.5)
Eosinophils Relative: 1 %
HCT: 38.4 % — ABNORMAL LOW (ref 39.0–52.0)
Hemoglobin: 13 g/dL (ref 13.0–17.0)
Immature Granulocytes: 7 %
Lymphocytes Relative: 12 %
Lymphs Abs: 1.3 10*3/uL (ref 0.7–4.0)
MCH: 34.6 pg — ABNORMAL HIGH (ref 26.0–34.0)
MCHC: 33.9 g/dL (ref 30.0–36.0)
MCV: 102.1 fL — ABNORMAL HIGH (ref 80.0–100.0)
Monocytes Absolute: 0.9 10*3/uL (ref 0.1–1.0)
Monocytes Relative: 8 %
Neutro Abs: 7.8 10*3/uL — ABNORMAL HIGH (ref 1.7–7.7)
Neutrophils Relative %: 71 %
Platelet Count: 251 10*3/uL (ref 150–400)
RBC: 3.76 MIL/uL — ABNORMAL LOW (ref 4.22–5.81)
RDW: 21.8 % — ABNORMAL HIGH (ref 11.5–15.5)
WBC Count: 11 10*3/uL — ABNORMAL HIGH (ref 4.0–10.5)
nRBC: 0 % (ref 0.0–0.2)

## 2022-05-31 LAB — CMP (CANCER CENTER ONLY)
ALT: 19 U/L (ref 0–44)
AST: 21 U/L (ref 15–41)
Albumin: 3.7 g/dL (ref 3.5–5.0)
Alkaline Phosphatase: 83 U/L (ref 38–126)
Anion gap: 5 (ref 5–15)
BUN: 19 mg/dL (ref 8–23)
CO2: 33 mmol/L — ABNORMAL HIGH (ref 22–32)
Calcium: 10.4 mg/dL — ABNORMAL HIGH (ref 8.9–10.3)
Chloride: 99 mmol/L (ref 98–111)
Creatinine: 1.21 mg/dL (ref 0.61–1.24)
GFR, Estimated: >60 mL/min (ref >60)
Glucose, Bld: 114 mg/dL — ABNORMAL HIGH (ref 70–99)
Potassium: 4.2 mmol/L (ref 3.5–5.1)
Sodium: 137 mmol/L (ref 135–145)
Total Bilirubin: 0.8 mg/dL (ref 0.3–1.2)
Total Protein: 6.2 g/dL — ABNORMAL LOW (ref 6.5–8.1)

## 2022-05-31 LAB — LACTATE DEHYDROGENASE: LDH: 296 U/L — ABNORMAL HIGH (ref 98–192)

## 2022-06-01 ENCOUNTER — Other Ambulatory Visit: Payer: Self-pay

## 2022-06-01 DIAGNOSIS — C8330 Diffuse large B-cell lymphoma, unspecified site: Secondary | ICD-10-CM

## 2022-06-01 DIAGNOSIS — A4151 Sepsis due to Escherichia coli [E. coli]: Secondary | ICD-10-CM | POA: Diagnosis not present

## 2022-06-01 NOTE — Progress Notes (Signed)
mb

## 2022-06-02 ENCOUNTER — Ambulatory Visit (INDEPENDENT_AMBULATORY_CARE_PROVIDER_SITE_OTHER): Payer: Medicare PPO | Admitting: Infectious Disease

## 2022-06-02 ENCOUNTER — Other Ambulatory Visit: Payer: Self-pay

## 2022-06-02 ENCOUNTER — Encounter: Payer: Self-pay | Admitting: Infectious Disease

## 2022-06-02 VITALS — BP 119/86 | HR 79 | Temp 98.3°F | Ht 76.0 in | Wt 252.0 lb

## 2022-06-02 DIAGNOSIS — Z9181 History of falling: Secondary | ICD-10-CM

## 2022-06-02 DIAGNOSIS — J3081 Allergic rhinitis due to animal (cat) (dog) hair and dander: Secondary | ICD-10-CM | POA: Diagnosis not present

## 2022-06-02 DIAGNOSIS — J3089 Other allergic rhinitis: Secondary | ICD-10-CM | POA: Diagnosis not present

## 2022-06-02 DIAGNOSIS — C8330 Diffuse large B-cell lymphoma, unspecified site: Secondary | ICD-10-CM

## 2022-06-02 DIAGNOSIS — A499 Bacterial infection, unspecified: Secondary | ICD-10-CM

## 2022-06-02 DIAGNOSIS — M810 Age-related osteoporosis without current pathological fracture: Secondary | ICD-10-CM | POA: Diagnosis not present

## 2022-06-02 DIAGNOSIS — Z1612 Extended spectrum beta lactamase (ESBL) resistance: Secondary | ICD-10-CM

## 2022-06-02 DIAGNOSIS — N390 Urinary tract infection, site not specified: Secondary | ICD-10-CM

## 2022-06-02 DIAGNOSIS — J301 Allergic rhinitis due to pollen: Secondary | ICD-10-CM | POA: Diagnosis not present

## 2022-06-02 NOTE — Progress Notes (Signed)
Subjective:  Chief complaint: Still feeling a bit fatigued but appears better than when I saw him last   Patient ID: [name], male    DOB: 08-13-1946, 76 y.o.   MRN: [id number]  HPI  [name] is a 76 year old Caucasian man with a history of coronary disease unfortunately recurrent diffuse large B-cell lymphoma, history of renal calculi including a staghorn calculus problems with voiding urinary incontinence who is recently in July admitted to the hospital with severe urinary tract infection due to ESBL.  He was given meropenem in the hospital and then sent home with Macrobid.  He was feeling better while taking the meropenem and Macrobid but then began to feel poorly again with malaise.  Note before he was diagnosed with a severe infection he did not have much in the way of urinary symptoms other than polyuria.  He did not have dysuria or suprapubic pain or clear-cut back pain.  However he had begun to become confused and encephalopathic and was calling the wrong numbers and saying things that did not make sense to his friends.  Ultimately his husband was able to convince him to come in to the ER where he was hospitalized he was seen by his oncologist Dr. Irene Limbo who  was concerned by patient's apparent recurrence of malaise and symptoms and he was initiated on meropenem for an 11-day course.  He was able to get this at home via his port and his husband was able to administer antibiotics for the port after teaching from home health.  He was continuing to struggle with multiple medical problems including orthostatic hypotension deconditioning and frequent falls the weekend prior to my seeing him in clinic.  He was given a prescription of Augmentin for presumed urinary tract infection by his primary care physician though is not clear that he has symptoms that are clearly due to urinary tract infection.  One of the falls he had sounds to have been due to proprioceptive problems he did strike his  knee which is has a wound which does not appear overtly infected.  His urine was analyzed at PCPs office but no culture done there is a urine culture done by the home health company which showed E coli    ** S = Susceptible; I = Intermediate; R = Resistant **                    P = Positive; N = Negative             MICS are expressed in micrograms per mL    Antibiotic                 RSLT#1    RSLT#2    RSLT#3    RSLT#4 Amoxicillin/Clavulanic Acid    S Ampicillin                     R Cefazolin                      R Cefepime                       S Ceftriaxone                    R Cefuroxime                     R Ciprofloxacin  R Ertapenem                      S Gentamicin                     R Imipenem                       S Levofloxacin                   R Meropenem                      S Nitrofurantoin                 S Piperacillin/Tazobactam        S Tetracycline                   R Tobramycin                     R Trimethoprim/Sulfa             R  I saw him I was not entirely convinced that he had a urinary tract infection due to the absence of specific urinary symptoms.  I was bothered by the fact that he had fallen 3 times in by some of his other systemic symptoms though it was not clear to me that they were due to an infection.  I decided to err on the side of being aggressive with antibiotics and initiated IV ertapenem I also asked for him to receive a bolus of normal saline.  In the chart he does have an extensive history of kidney stones including a staghorn calculus at one point in time.  In talking to steroid today he told me he is not sure if he is better or not.  His husband  however tells me that since the biotics have started there has been no more falling and no more episodes of confusion and of [name] not making sense.  Going to urology next week.     Past Medical History:  Diagnosis Date   Anticoagulant long-term use    xarelto---  takes for AFib,  managed by pcp, Dr Alain Marion   Benign localized prostatic hyperplasia with lower urinary tract symptoms (LUTS)    CAD (coronary artery disease) cardiologist--  dr t. Oval Linsey   hx of known CAD obstruction w/ collaterals (cath done @ [facility] 12-31-2011) ;   last cardiac cath 08-13-2019  showed sig. 2V CAD involving proxLAD and CTO of the RCA/  chronic total occlusion midRCA w/ bridging and L>R collaterals   Diastolic CHF ([place])    dx [id number] hospital admission (followed by pcp)   Diffuse large B cell lymphoma [facility]) oncologist-- dr Irene Limbo--- in remission   dx 08/ 2019 -- bx left tonsill mass-- involving lymph nodes--- completed chemotherapy 10-12-2018   Diffuse large B cell lymphoma ([place]) 05/24/2022   DJD (degenerative joint disease)    Dyspnea    Dysthymic disorder    Elevated brain natriuretic peptide (BNP) level    Environmental allergies    GAD (generalized anxiety disorder)    GERD (gastroesophageal reflux disease)    History of falling    multiple times   History of Graves' disease    1987 s/p total thyroidectomy   History of kidney stones    History of scabies    07/ 2014  resolved   History  of syncope    per D/C note in epic 02-21-2013 ?vasovagal   History of vertebral compression fracture    05/ 2014 --  L1, no surgical intervention   HLD (hyperlipidemia)    Hyperlipidemia    Hypertension    Hypogonadism in male    IDA (iron deficiency anemia)    Lumbago    OA (osteoarthritis)    OSA on CPAP    cpap machine-settings 17   Osteomyelitis of right wrist ([place]) 11/02/2012   Osteoporosis    Severe   Osteoporosis with fracture 04/24/2013   Pathological fracture due to osteoporosis with delayed healing 04/24/2013   Persistent atrial fibrillation [facility])    cardiologist--- dr Alfonso Patten. [place]   Plaque psoriasis    dermatologist--- dr Harriett Sine--- currently taking otezla   Post-surgical hypothyroidism    followed by pcp---  s/p total thyroidectomy for graves  disease in 1987   Pure hypercholesterolemia 02/10/2021   Renal calculus, right    Right femoral fracture ([place]) 09/29/2012   Right forearm fracture 09/29/2012   Right tibial fracture 02/05/2013   Thrombocytopenia ([facility])    Unsteady gait    uses walker   Wears hearing aid in both ears     Past Surgical History:  Procedure Laterality Date   APPENDECTOMY  2011 approx.    CARDIAC CATHETERIZATION  12-31-2011   dr j. Beatrix Fetters '@[facility]'$    normal LVF w/ multivessel CAD,  occluded RCA w/ colleterals   CATARACT EXTRACTION W/ INTRAOCULAR LENS  IMPLANT, BILATERAL  2016 approx.   COLONOSCOPY     CYSTOSCOPY WITH RETROGRADE PYELOGRAM, URETEROSCOPY AND STENT PLACEMENT Left 11/07/2017   Procedure: CYSTOSCOPY WITH RETROGRADE PYELOGRAM, URETEROSCOPY AND STENT PLACEMENT;  Surgeon: Cleon Gustin, MD;  Location: [facility];  Service: Urology;  Laterality: Left;   CYSTOSCOPY WITH RETROGRADE PYELOGRAM, URETEROSCOPY AND STENT PLACEMENT Right 10/22/2019   Procedure: CYSTOSCOPY WITH RETROGRADE PYELOGRAM, URETEROSCOPY AND STENT PLACEMENT;  Surgeon: Cleon Gustin, MD;  Location: [facility];  Service: Urology;  Laterality: Right;   CYSTOSCOPY/URETEROSCOPY/HOLMIUM LASER/STENT PLACEMENT Right 03/11/2017   Procedure: CYSTOSCOPY/URETEROSCOPYSTENT PLACEMENT right ureter retrograde pylegram;  Surgeon: Cleon Gustin, MD;  [facility];  Service: Urology;  Laterality: Right;   HARDWARE REMOVAL  10/05/2012   Procedure: HARDWARE REMOVAL;  Surgeon: [name], MD;  [facility];  Service: Orthopedics;  Laterality: Right;  REMOVING  STRYKER  GAMMA NAIL   HARDWARE REMOVAL Right 07/03/2013   Procedure: HARDWARE REMOVAL RIGHT TIBIA ;  Surgeon: Rozanna Box, MD;  Location: [place];  Service: Orthopedics;  Laterality: Right;   HIP CLOSED REDUCTION Right 10/15/2013   Procedure: CLOSED MANIPULATION HIP;  Surgeon: [name], MD;  [facility];  Service: Orthopedics;  Laterality: Right;   HOLMIUM  LASER APPLICATION Right 01/15/9628   Procedure: HOLMIUM LASER APPLICATION;  Surgeon: Cleon Gustin, MD;  [facility];  Service: Urology;  Laterality: Right;   HOLMIUM LASER APPLICATION Left 03/08/4131   Procedure: HOLMIUM LASER APPLICATION;  Surgeon: Cleon Gustin, MD;  [facility];  Service: Urology;  Laterality: Left;   HOLMIUM LASER APPLICATION Left 01/12/101   Procedure: HOLMIUM LASER APPLICATION;  Surgeon: Cleon Gustin, MD;  [facility];  Service: Urology;  Laterality: Left;   HOLMIUM LASER APPLICATION Right 05/05/3663   Procedure: HOLMIUM LASER APPLICATION;  Surgeon: Cleon Gustin, MD;  Location[facility];  Service: Urology;  Laterality: Right;   INCISION AND DRAINAGE HIP Right 11/16/2013  Procedure: IRRIGATION AND DEBRIDEMENT RIGHT HIP;  Surgeon: [name], MD;  [facility];  Service: Orthopedics;  Laterality: Right;   INTRAVASCULAR PRESSURE WIRE/FFR STUDY N/A 08/13/2019   Procedure: INTRAVASCULAR PRESSURE WIRE/FFR STUDY;  Surgeon: Nelva Bush, MD;  Location: [facility];  Service: Cardiovascular;  Laterality: N/A;   IR IMAGING GUIDED PORT INSERTION  06/22/2018   IR NEPHROSTOMY PLACEMENT LEFT  03/28/2019   IR URETERAL STENT LEFT NEW ACCESS W/O SEP NEPHROSTOMY CATH  10/24/2017   IR URETERAL STENT LEFT NEW ACCESS W/O SEP NEPHROSTOMY CATH  03/26/2019   NEPHROLITHOTOMY Right 02/08/2017   Procedure: NEPHROLITHOTOMY PERCUTANEOUS WITH SURGEON ACCESS;  Surgeon: Cleon Gustin, MD;  [facility];  Service: Urology;  Laterality: Right;   NEPHROLITHOTOMY Left 10/24/2017   Procedure: NEPHROLITHOTOMY PERCUTANEOUS;  Surgeon: Cleon Gustin, MD;  [facility];  Service: Urology;  Laterality: Left;   NEPHROLITHOTOMY Left 03/26/2019   Procedure: NEPHROLITHOTOMY PERCUTANEOUS;  Surgeon: Cleon Gustin, MD;  [facility];  Service: Urology;  Laterality: Left;  2 HRS   NEPHROLITHOTOMY Left 04/17/2019   Procedure:  NEPHROLITHOTOMY PERCUTANEOUS;  Surgeon: Cleon Gustin, MD;  [facility];  Service: Urology;  Laterality: Left;  2 HRS   ORIF TIBIA FRACTURE Right 02/06/2013   Procedure: OPEN REDUCTION INTERNAL FIXATION (ORIF) TIBIA FRACTURE WITH IM ROD FIBULA;  Surgeon: Rozanna Box, MD;  Location: [place];  Service: Orthopedics;  Laterality: Right;   ORIF TIBIA FRACTURE Right 07/03/2013   Procedure: RIGHT TIBIA NON UNION REPAIR ;  Surgeon: Rozanna Box, MD;  Location: [place];  Service: Orthopedics;  Laterality: Right;   ORIF WRIST FRACTURE  10/02/2012   Procedure: OPEN REDUCTION INTERNAL FIXATION (ORIF) WRIST FRACTURE;  Surgeon: Roseanne Kaufman, MD;  [facility];  Service: Orthopedics;  Laterality: Right;  WITH   ANTIBIOTIC  CEMENT   ORIF WRIST FRACTURE Left 10/28/2013   Procedure: OPEN REDUCTION INTERNAL FIXATION (ORIF) WRIST FRACTURE with allograft;  Surgeon: Roseanne Kaufman, MD;  [facility];  Service: Orthopedics;  Laterality: Left;  DVR Plate   QUADRICEPS TENDON REPAIR Left 07/15/2017   Procedure: REPAIR QUADRICEP TENDON;  Surgeon: Frederik Pear, MD;  Location: [place];  Service: Orthopedics;  Laterality: Left;   RIGHT/LEFT HEART CATH AND CORONARY ANGIOGRAPHY N/A 08/13/2019   Procedure: RIGHT/LEFT HEART CATH AND CORONARY ANGIOGRAPHY;  Surgeon: Nelva Bush, MD;  Location: [facility] [facility];  Service: Cardiovascular;  Laterality: N/A;   THYROIDECTOMY  02/1986   TOTAL HIP ARTHROPLASTY Right 03-16-2016   '@[facility]'$    TOTAL HIP REVISION  10/05/2012   Procedure: TOTAL HIP REVISION;  Surgeon: [name], MD;  [facility];  Service: Orthopedics;  Laterality: Right;  RIGHT TOTAL HIP REVISION   TOTAL HIP REVISION Right 09/17/2013   Procedure: REVISION RIGHT TOTAL HIP ARTHROPLASTY ;  Surgeon: [name], MD;  [facility];  Service: Orthopedics;  Laterality: Right;   TOTAL HIP REVISION Right 10/26/2013   Procedure: REVISION RIGHT TOTAL HIP ARTHROPLASTY;  Surgeon: [name], MD;   [facility];  Service: Orthopedics;  Laterality: Right;   TOTAL KNEE ARTHROPLASTY Bilateral right 03-15-2011;  left 06-30-2011   TOTAL KNEE REVISION Left 04/11/2017   Procedure: TOTAL KNEE REVISION PATELLA and TIBIA;  Surgeon: Frederik Pear, MD;  Location: [place];  Service: Orthopedics;  Laterality: Left;    Family History  Problem Relation Age of Onset   CAD Father 102   Asthma Father  Alcohol abuse Father    Arthritis Mother    Alcohol abuse Sister       Social History   Socioeconomic History   Marital status: Married    Spouse name: Not on file   Number of children: Not on file   Years of education: Not on file   Highest education level: Not on file  Occupational History   Not on file  Tobacco Use   Smoking status: Former    Packs/day: 1.00    Years: 50.00    Total pack years: 50.00    Types: Cigarettes    Quit date: 01/12/2012    Years since quitting: 10.3   Smokeless tobacco: Never  Vaping Use   Vaping Use: Never used  Substance and Sexual Activity   Alcohol use: Not Currently    Comment: occasional-social   Drug use: Never   Sexual activity: Yes  Other Topics Concern   Not on file  Social History Narrative   [place] 2.5 months, went home Feb 21st slipped and fell on back and developed.  Home PT/OT.  Just started outpatient physical therapy.  Friday night, misstepped.       Pt spouse verbalized pt had fallen 4 times within the last 24 hours     Social Determinants of Health   Financial Resource Strain: Low Risk  (01/07/2022)   Overall Financial Resource Strain (CARDIA)    Difficulty of Paying Living Expenses: Not hard at all  Food Insecurity: No Food Insecurity (01/07/2022)   Hunger Vital Sign    Worried About Running Out of Food in the Last Year: Never true    Ran Out of Food in the Last Year: Never true  Transportation Needs: No Transportation Needs (01/07/2022)   PRAPARE - Hydrologist (Medical): No    Lack of Transportation  (Non-Medical): No  Physical Activity: Sufficiently Active (01/07/2022)   Exercise Vital Sign    Days of Exercise per Week: 3 days    Minutes of Exercise per Session: 60 min  Stress: No Stress Concern Present (01/07/2022)   [place]    Feeling of Stress : Not at all  Social Connections: [place] (01/07/2022)   Social Connection and Isolation Panel [NHANES]    Frequency of Communication with Friends and Family: More than three times a week    Frequency of Social Gatherings with Friends and Family: More than three times a week    Attends Religious Services: 1 to 4 times per year    Active Member of [employer] or Organizations: No    Attends Music therapist: 1 to 4 times per year    Marital Status: Married    Allergies  Allergen Reactions   Short Ragweed Pollen Ext Cough     Current Outpatient Medications:    acetaminophen (TYLENOL) 325 MG tablet, Take 2 tablets (650 mg total) by mouth every 6 (six) hours as needed for mild pain (or Fever >/= 101)., Disp: 20 tablet, Rfl:    albuterol (VENTOLIN HFA) 108 (90 Base) MCG/ACT inhaler, Inhale 2 puffs into the lungs every 6 (six) hours as needed for wheezing or shortness of breath., Disp: , Rfl:    alfuzosin (UROXATRAL) 10 MG 24 hr tablet, TAKE 1 TABLET AT BEDTIME (Patient taking differently: Take 10 mg by mouth at bedtime.), Disp: 90 tablet, Rfl: 3   allopurinol (ZYLOPRIM) 100 MG tablet, Take 1 tablet (100 mg total) by mouth 2 (  two) times daily. (Patient not taking: Reported on 05/20/2022), Disp: 60 tablet, Rfl: 0   amoxicillin-clavulanate (AUGMENTIN) 875-125 MG tablet, Take 1 tablet by mouth 2 (two) times daily., Disp: 20 tablet, Rfl: 1   atorvastatin (LIPITOR) 80 MG tablet, Take 80 mg by mouth at bedtime., Disp: , Rfl:    buPROPion (WELLBUTRIN XL) 300 MG 24 hr tablet, TAKE 1 TABLET EVERY DAY WITH BREAKFAST (Patient taking differently: Take 300 mg by mouth daily  with breakfast.), Disp: 90 tablet, Rfl: 3   Calcium Citrate-Vitamin D (CALCIUM CITRATE + D PO), Take 1 tablet by mouth 2 (two) times daily. '1200mg'$  of Calcium and 1000units vitamin D3, Disp: , Rfl:    Cholecalciferol (VITAMIN D-3 PO), Take 1 capsule by mouth 2 (two) times daily., Disp: , Rfl:    clonazePAM (KLONOPIN) 1 MG tablet, Take 1 tablet (1 mg total) by mouth every 6 (six) hours as needed for anxiety. Take 1 tablet (1 mg total) by mouth every 6 (six) hours as needed. for anxiety Strength: 1 mg (Patient taking differently: Take 1 mg by mouth See admin instructions. Take one tablet (1 mg) by mouth daily with lunch, may also take one tablet (1 mg) at bedtime as needed for sleep), Disp: 10 tablet, Rfl: 0   CRANBERRY PO, Take 2 tablets by mouth every morning., Disp: , Rfl:    denosumab (PROLIA) 60 MG/ML SOSY injection, Inject 60 mg as directed every 6 (six) months., Disp: , Rfl:    dexamethasone (DECADRON) 4 MG tablet, Take 5 tablets (20 mg total) by mouth daily. Start the day after carboplatin chemotherapy for 3 days. (Patient taking differently: Take 20 mg by mouth See admin instructions. Take 5 tablets (20 mg) by mouth on days 1-3 after carboplatin chemotherapy), Disp: 30 tablet, Rfl: 3   diltiazem (CARDIZEM CD) 180 MG 24 hr capsule, Take 1 capsule by mouth daily., Disp: 90 capsule, Rfl: 3   Docusate Sodium 100 MG capsule, Take 100 mg by mouth 2 (two) times daily. (Patient not taking: Reported on 05/20/2022), Disp: , Rfl:    DULoxetine (CYMBALTA) 60 MG capsule, TAKE 1 CAPSULE TWICE DAILY (Patient taking differently: Take 60 mg by mouth See admin instructions. Take one capsule (60 mg) by mouth twice daily - with lunch and supper), Disp: 180 capsule, Rfl: 3   EPINEPHrine 0.3 mg/0.3 mL IJ SOAJ injection, Inject 0.3 mg into the muscle once as needed for anaphylaxis (severe allergic reaction)., Disp: , Rfl:    ezetimibe (ZETIA) 10 MG tablet, TAKE 1 TABLET EVERY DAY (Patient taking differently: Take 10 mg by  mouth every morning.), Disp: 90 tablet, Rfl: 2   feeding supplement (ENSURE ENLIVE / ENSURE PLUS) LIQD, Take 237 mLs by mouth 2 (two) times daily between meals. (Patient taking differently: Take 237 mLs by mouth daily.), Disp: 237 mL, Rfl: 12   ferrous sulfate 325 (65 FE) MG tablet, Take 325 mg by mouth 2 (two) times daily with a meal. , Disp: , Rfl:    HYDROcodone-acetaminophen (NORCO) 7.5-325 MG tablet, Take 1 tablet by mouth every 6 (six) hours as needed for severe pain., Disp: 10 tablet, Rfl: 0   levothyroxine (SYNTHROID) 100 MCG tablet, Take 1 tablet (100 mcg total) by mouth daily before breakfast., Disp: 90 tablet, Rfl: 3   lidocaine-prilocaine (EMLA) cream, Apply 1 Application topically as needed., Disp: 30 g, Rfl: 0   loratadine (CLARITIN) 10 MG tablet, Take 10 mg by mouth every morning., Disp: , Rfl:    Magnesium 500 MG  CAPS, Take 1 capsule (500 mg total) by mouth daily. (Patient taking differently: Take 500 mg by mouth daily with supper.), Disp: 30 capsule, Rfl: 1   metoprolol tartrate (LOPRESSOR) 25 MG tablet, Take 1 tablet (25 mg total) by mouth 2 (two) times daily., Disp: 180 tablet, Rfl: 3   Multiple Vitamin (MULTIVITAMIN WITH MINERALS) TABS tablet, Take 1 tablet by mouth every morning. Men's One-A-Day 50+, Disp: , Rfl:    MYRBETRIQ 25 MG TB24 tablet, TAKE 1 TABLET EVERY DAY (Patient taking differently: Take 25 mg by mouth at bedtime.), Disp: 90 tablet, Rfl: 3   nitroGLYCERIN (NITROSTAT) 0.4 MG SL tablet, Place 1 tablet (0.4 mg total) under the tongue every 5 (five) minutes as needed for chest pain (max 3 doses)., Disp: 25 tablet, Rfl: 4   omeprazole (PRILOSEC) 40 MG capsule, TAKE 1 CAPSULE EVERY DAY BEFORE BREAKFAST (Patient taking differently: Take 40 mg by mouth every morning.), Disp: 90 capsule, Rfl: 3   ondansetron (ZOFRAN) 8 MG tablet, Take 1 tablet (8 mg total) by mouth 2 (two) times daily as needed. Start on the 3rd day after cisplatin chemotherapy., Disp: 30 tablet, Rfl: 1    polyethylene glycol (MIRALAX / GLYCOLAX) 17 g packet, Take 17 g by mouth daily., Disp: 14 each, Rfl: 0   Potassium Citrate 15 MEQ (1620 MG) TBCR, TAKE 1 TABLET TWICE DAILY (Patient taking differently: Take 1 tablet by mouth See admin instructions. Take one tablet (15 meq) by mouth twice daily - with lunch and supper), Disp: 180 tablet, Rfl: 3   Probiotic Product (PROBIOTIC PO), Take 1 capsule by mouth daily with breakfast., Disp: , Rfl:    prochlorperazine (COMPAZINE) 10 MG tablet, Take 1 tablet (10 mg total) by mouth every 6 (six) hours as needed (Nausea or vomiting)., Disp: 30 tablet, Rfl: 1   rivaroxaban (XARELTO) 20 MG TABS tablet, Take 1 tablet (20 mg total) by mouth daily with supper., Disp: 90 tablet, Rfl: 3   solifenacin (VESICARE) 10 MG tablet, Take 10 mg by mouth daily with supper., Disp: , Rfl:    testosterone enanthate (DELATESTRYL) 200 MG/ML injection, INJECT 1ML ('200MG'$ ) INTO THE MUSCLE EVERY 14 DAYS. DISCARD VIAL AFTER 28 DAYS. (Patient taking differently: Inject 100 mg into the muscle every 14 (fourteen) days. Every other Friday), Disp: 5 mL, Rfl: 3   vitamin C (ASCORBIC ACID) 500 MG tablet, Take 500 mg by mouth daily with lunch., Disp: , Rfl:    Review of Systems  Constitutional:  Negative for activity change, appetite change, chills, diaphoresis, fatigue, fever and unexpected weight change.  HENT:  Negative for congestion, rhinorrhea, sinus pressure, sneezing, sore throat and trouble swallowing.   Eyes:  Negative for photophobia and visual disturbance.  Respiratory:  Negative for cough, chest tightness, shortness of breath, wheezing and stridor.   Cardiovascular:  Negative for chest pain, palpitations and leg swelling.  Gastrointestinal:  Negative for abdominal distention, abdominal pain, anal bleeding, blood in stool, constipation, diarrhea, nausea and vomiting.  Genitourinary:  Negative for difficulty urinating, dysuria, flank pain and hematuria.  Musculoskeletal:  Negative for  arthralgias, back pain, gait problem, joint swelling and myalgias.  Skin:  Negative for color change, pallor, rash and wound.  Neurological:  Positive for weakness. Negative for dizziness, tremors and light-headedness.  Hematological:  Negative for adenopathy. Does not bruise/bleed easily.  Psychiatric/Behavioral:  Negative for agitation, behavioral problems, confusion, decreased concentration, dysphoric mood and sleep disturbance.        Objective:   Physical Exam Constitutional:  Appearance: He is well-developed.  HENT:     Head: Normocephalic and atraumatic.  Eyes:     Conjunctiva/sclera: Conjunctivae normal.  Cardiovascular:     Rate and Rhythm: Normal rate and regular rhythm.  Pulmonary:     Effort: Pulmonary effort is normal. No respiratory distress.     Breath sounds: No wheezing.  Abdominal:     General: There is no distension.     Palpations: Abdomen is soft.  Musculoskeletal:        General: No tenderness. Normal range of motion.     Cervical back: Normal range of motion and neck supple.  Skin:    General: Skin is warm and dry.     Coloration: Skin is not pale.     Findings: No erythema or rash.  Neurological:     General: No focal deficit present.     Mental Status: He is alert and oriented to person, place, and time.  Psychiatric:        Mood and Affect: Mood normal.        Behavior: Behavior normal.        Thought Content: Thought content normal.        Judgment: Judgment normal.     Port is accessed and site is clean          Assessment & Plan:   Recurrent urinary tract infections + colonization with ESB:   Complete his course of ertapenem. Labs from [facility] reviewed  [facility].  We will try to assist with helping with restraint with giving antibiotics.  Unfortunately when he develops what appears to be actual urinary tract infections he does not have much in the way of specific urinary symptoms but rather becomes  apparent once he starts becoming confused or falling.  Diffuse large B-cell lymphoma following closely with Dr. Irene Limbo

## 2022-06-03 DIAGNOSIS — F410 Panic disorder [episodic paroxysmal anxiety] without agoraphobia: Secondary | ICD-10-CM | POA: Diagnosis not present

## 2022-06-03 DIAGNOSIS — F331 Major depressive disorder, recurrent, moderate: Secondary | ICD-10-CM | POA: Diagnosis not present

## 2022-06-07 ENCOUNTER — Encounter: Payer: Self-pay | Admitting: Hematology

## 2022-06-07 NOTE — Progress Notes (Signed)
HEMATOLOGY/ONCOLOGY CLINIC NOTE  Date of Service: 05/31/2022    Patient Care Team: Cassandria Anger, MD as PCP - General (Internal Medicine) Skeet Latch, MD as PCP - Cardiology (Cardiology) Brunetta Genera, MD as Consulting Physician (Hematology) McKenzie, Candee Furbish, MD as Consulting Physician (Urology) Jackquline Denmark, MD as Consulting Physician (Gastroenterology) Skeet Latch, MD as Attending Physician (Cardiology) Lynnell Dike, [place] as Consulting Physician (Optometry) Szabat, Darnelle Maffucci, [facility] (Inactive) as Pharmacist (Pharmacist) Harriett Sine, MD as Consulting Physician (Dermatology)  CHIEF COMPLAINTS/PURPOSE OF CONSULTATION:  Follow-up for continued evaluation and management of relapsed large B-cell lymphoma  HISTORY OF PRESENTING ILLNESS:  Please see previous note for details on initial presentation.   INTERVAL HISTORY   [name] is 76 y.o. male is here for follow-up of his large B-cell lymphoma.  He notes that he had recurrent urinary tract infection and was started on ertapenem by Dr. Tommy Medal. Currently notes he is getting stronger with therapies. Tolerated his last cycle of chemotherapy without any other acute issues. We discussed his PET CT scan which showed partial response. He is still awaiting appointment from [facility] for evaluation of consolidative treatment options and consideration of CAR-T cell therapy. We discussed that in the interim we shall set him up for additional cycle of chemotherapy. Labs done today were discussed with him in detail.   MEDICAL HISTORY:  Past Medical History:  Diagnosis Date   Anticoagulant long-term use    xarelto--- takes for AFib,  managed by pcp, Dr Alain Marion   Benign localized prostatic hyperplasia with lower urinary tract symptoms (LUTS)    CAD (coronary artery disease) cardiologist--  dr t. Oval Linsey   hx of known CAD obstruction w/ collaterals (cath done @ [facility] 12-31-2011) ;   last  cardiac cath 08-13-2019  showed sig. 2V CAD involving proxLAD and CTO of the RCA/  chronic total occlusion midRCA w/ bridging and L>R collaterals   Diastolic CHF ([place])    dx [phone] hospital admission (followed by pcp)   Diffuse large B cell lymphoma [facility]) oncologist-- dr Irene Limbo--- in remission   dx 08/ 2019 -- bx left tonsill mass-- involving lymph nodes--- completed chemotherapy 10-12-2018   Diffuse large B cell lymphoma ([place]) 05/24/2022   DJD (degenerative joint disease)    Dyspnea    Dysthymic disorder    Elevated brain natriuretic peptide (BNP) level    Environmental allergies    GAD (generalized anxiety disorder)    GERD (gastroesophageal reflux disease)    History of falling    multiple times   History of Graves' disease    1987 s/p total thyroidectomy   History of kidney stones    History of scabies    07/ 2014  resolved   History of syncope    per D/C note in epic 02-21-2013 ?vasovagal   History of vertebral compression fracture    05/ 2014 --  L1, no surgical intervention   HLD (hyperlipidemia)    Hyperlipidemia    Hypertension    Hypogonadism in male    IDA (iron deficiency anemia)    Lumbago    OA (osteoarthritis)    OSA on CPAP    cpap machine-settings 17   Osteomyelitis of right wrist ([place]) 11/02/2012   Osteoporosis    Severe   Osteoporosis with fracture 04/24/2013   Pathological fracture due to osteoporosis with delayed healing 04/24/2013   Persistent atrial fibrillation [facility])    cardiologist--- dr r. Oval Linsey   Plaque psoriasis  dermatologist--- dr Harriett Sine--- currently taking otezla   Post-surgical hypothyroidism    followed by pcp---  s/p total thyroidectomy for graves disease in 1987   Pure hypercholesterolemia 02/10/2021   Recurrent UTI 06/02/2022   Renal calculus, right    Right femoral fracture ([place]) 09/29/2012   Right forearm fracture 09/29/2012   Right tibial fracture 02/05/2013   Thrombocytopenia (H[facility])    Unsteady gait    uses walker    Wears hearing aid in both ears     SURGICAL HISTORY: Past Surgical History:  Procedure Laterality Date   APPENDECTOMY  2011 approx.    CARDIAC CATHETERIZATION  12-31-2011   dr Lenna Sciara. Beatrix Fetters '@[facility]'$    normal LVF w/ multivessel CAD,  occluded RCA w/ colleterals   CATARACT EXTRACTION W/ INTRAOCULAR LENS  IMPLANT, BILATERAL  2016 approx.   COLONOSCOPY     CYSTOSCOPY WITH RETROGRADE PYELOGRAM, URETEROSCOPY AND STENT PLACEMENT Left 11/07/2017   Procedure: CYSTOSCOPY WITH RETROGRADE PYELOGRAM, URETEROSCOPY AND STENT PLACEMENT;  Surgeon: Cleon Gustin, MD;  Location: [facility];  Service: Urology;  Laterality: Left;   CYSTOSCOPY WITH RETROGRADE PYELOGRAM, URETEROSCOPY AND STENT PLACEMENT Right 10/22/2019   Procedure: CYSTOSCOPY WITH RETROGRADE PYELOGRAM, URETEROSCOPY AND STENT PLACEMENT;  Surgeon: Cleon Gustin, MD;  Location[facility];  Service: Urology;  Laterality: Right;   CYSTOSCOPY/URETEROSCOPY/HOLMIUM LASER/STENT PLACEMENT Right 03/11/2017   Procedure: CYSTOSCOPY/URETEROSCOPYSTENT PLACEMENT right ureter retrograde pylegram;  Surgeon: Cleon Gustin, MD;  [facility];  Service: Urology;  Laterality: Right;   HARDWARE REMOVAL  10/05/2012   Procedure: HARDWARE REMOVAL;  Surgeon: [name], MD;  [facility];  Service: Orthopedics;  Laterality: Right;  REMOVING  STRYKER  GAMMA NAIL   HARDWARE REMOVAL Right 07/03/2013   Procedure: HARDWARE REMOVAL RIGHT TIBIA ;  Surgeon: Rozanna Box, MD;  Location: [place];  Service: Orthopedics;  Laterality: Right;   HIP CLOSED REDUCTION Right 10/15/2013   Procedure: CLOSED MANIPULATION HIP;  Surgeon: [name], MD;  [facility];  Service: Orthopedics;  Laterality: Right;   HOLMIUM LASER APPLICATION Right 04/10/2457   Procedure: HOLMIUM LASER APPLICATION;  Surgeon: Cleon Gustin, MD;  [facility];  Service: Urology;  Laterality: Right;   HOLMIUM LASER APPLICATION Left 0/99/8338   Procedure: HOLMIUM LASER  APPLICATION;  Surgeon: Cleon Gustin, MD;  [facility];  Service: Urology;  Laterality: Left;   HOLMIUM LASER APPLICATION Left 11/15/537   Procedure: HOLMIUM LASER APPLICATION;  Surgeon: Cleon Gustin, MD;  [facility];  Service: Urology;  Laterality: Left;   HOLMIUM LASER APPLICATION Right 7/67/3419   Procedure: HOLMIUM LASER APPLICATION;  Surgeon: Cleon Gustin, MD;  Location[facility];  Service: Urology;  Laterality: Right;   INCISION AND DRAINAGE HIP Right 11/16/2013   Procedure: IRRIGATION AND DEBRIDEMENT RIGHT HIP;  Surgeon: [name], MD;  [facility];  Service: Orthopedics;  Laterality: Right;   INTRAVASCULAR PRESSURE WIRE/FFR STUDY N/A 08/13/2019   Procedure: INTRAVASCULAR PRESSURE WIRE/FFR STUDY;  Surgeon: Nelva Bush, MD;  Location: [facility];  Service: Cardiovascular;  Laterality: N/A;   IR IMAGING GUIDED PORT INSERTION  06/22/2018   IR NEPHROSTOMY PLACEMENT LEFT  03/28/2019   IR URETERAL STENT LEFT NEW ACCESS W/O SEP NEPHROSTOMY CATH  10/24/2017   IR URETERAL STENT LEFT NEW ACCESS W/O SEP NEPHROSTOMY CATH  03/26/2019   NEPHROLITHOTOMY Right 02/08/2017   Procedure: NEPHROLITHOTOMY PERCUTANEOUS WITH SURGEON ACCESS;  Surgeon: Cleon Gustin, MD;  [facility];  Service: Urology;  Laterality: Right;   NEPHROLITHOTOMY Left 10/24/2017   Procedure: NEPHROLITHOTOMY PERCUTANEOUS;  Surgeon: Cleon Gustin, MD;  [facility];  Service: Urology;  Laterality: Left;   NEPHROLITHOTOMY Left 03/26/2019   Procedure: NEPHROLITHOTOMY PERCUTANEOUS;  Surgeon: Cleon Gustin, MD;  [facility];  Service: Urology;  Laterality: Left;  2 HRS   NEPHROLITHOTOMY Left 04/17/2019   Procedure: NEPHROLITHOTOMY PERCUTANEOUS;  Surgeon: Cleon Gustin, MD;  [facility];  Service: Urology;  Laterality: Left;  2 HRS   ORIF TIBIA FRACTURE Right 02/06/2013   Procedure: OPEN REDUCTION INTERNAL FIXATION (ORIF) TIBIA FRACTURE WITH IM  ROD FIBULA;  Surgeon: Rozanna Box, MD;  Location: [place];  Service: Orthopedics;  Laterality: Right;   ORIF TIBIA FRACTURE Right 07/03/2013   Procedure: RIGHT TIBIA NON UNION REPAIR ;  Surgeon: Rozanna Box, MD;  Location: [place];  Service: Orthopedics;  Laterality: Right;   ORIF WRIST FRACTURE  10/02/2012   Procedure: OPEN REDUCTION INTERNAL FIXATION (ORIF) WRIST FRACTURE;  Surgeon: Roseanne Kaufman, MD;  [facility];  Service: Orthopedics;  Laterality: Right;  WITH   ANTIBIOTIC  CEMENT   ORIF WRIST FRACTURE Left 10/28/2013   Procedure: OPEN REDUCTION INTERNAL FIXATION (ORIF) WRIST FRACTURE with allograft;  Surgeon: Roseanne Kaufman, MD;  [facility];  Service: Orthopedics;  Laterality: Left;  DVR Plate   QUADRICEPS TENDON REPAIR Left 07/15/2017   Procedure: REPAIR QUADRICEP TENDON;  Surgeon: Frederik Pear, MD;  Location: [place];  Service: Orthopedics;  Laterality: Left;   RIGHT/LEFT HEART CATH AND CORONARY ANGIOGRAPHY N/A 08/13/2019   Procedure: RIGHT/LEFT HEART CATH AND CORONARY ANGIOGRAPHY;  Surgeon: Nelva Bush, MD;  Location: [place] [facility];  Service: Cardiovascular;  Laterality: N/A;   THYROIDECTOMY  02/1986   TOTAL HIP ARTHROPLASTY Right 03-16-2016   '@[facility]'$    TOTAL HIP REVISION  10/05/2012   Procedure: TOTAL HIP REVISION;  Surgeon: [name], MD;  [facility];  Service: Orthopedics;  Laterality: Right;  RIGHT TOTAL HIP REVISION   TOTAL HIP REVISION Right 09/17/2013   Procedure: REVISION RIGHT TOTAL HIP ARTHROPLASTY ;  Surgeon: [name], MD;  [facility];  Service: Orthopedics;  Laterality: Right;   TOTAL HIP REVISION Right 10/26/2013   Procedure: REVISION RIGHT TOTAL HIP ARTHROPLASTY;  Surgeon: [name], MD;  [facility];  Service: Orthopedics;  Laterality: Right;   TOTAL KNEE ARTHROPLASTY Bilateral right 03-15-2011;  left 06-30-2011   TOTAL KNEE REVISION Left 04/11/2017   Procedure: TOTAL KNEE REVISION PATELLA and TIBIA;  Surgeon: Frederik Pear,  MD;  Location: [place];  Service: Orthopedics;  Laterality: Left;    SOCIAL HISTORY: Social History   Socioeconomic History   Marital status: Married    Spouse name: Not on file   Number of children: Not on file   Years of education: Not on file   Highest education level: Not on file  Occupational History   Not on file  Tobacco Use   Smoking status: Former    Packs/day: 1.00    Years: 50.00    Total pack years: 50.00    Types: Cigarettes    Quit date: 01/12/2012    Years since quitting: 10.4   Smokeless tobacco: Never  Vaping Use   Vaping Use: Never used  Substance and Sexual Activity   Alcohol use: Yes    Comment: rarely   Drug use: Never   Sexual activity: Yes  Other Topics Concern  Not on file  Social History Narrative   [place] 2.5 months, went home Feb 21st slipped and fell on back and developed.  Home PT/OT.  Just started outpatient physical therapy.  Friday night, misstepped.       Pt spouse verbalized pt had fallen 4 times within the last 24 hours     Social Determinants of Health   Financial Resource Strain: Low Risk  (01/07/2022)   Overall Financial Resource Strain (CARDIA)    Difficulty of Paying Living Expenses: Not hard at all  Food Insecurity: No Food Insecurity (01/07/2022)   Hunger Vital Sign    Worried About Running Out of Food in the Last Year: Never true    Ran Out of Food in the Last Year: Never true  Transportation Needs: No Transportation Needs (01/07/2022)   PRAPARE - Hydrologist (Medical): No    Lack of Transportation (Non-Medical): No  Physical Activity: Sufficiently Active (01/07/2022)   Exercise Vital Sign    Days of Exercise per Week: 3 days    Minutes of Exercise per Session: 60 min  Stress: No Stress Concern Present (01/07/2022)   [place]    Feeling of Stress : Not at all  Social Connections: [place] (01/07/2022)   Social  Connection and Isolation Panel [NHANES]    Frequency of Communication with Friends and Family: More than three times a week    Frequency of Social Gatherings with Friends and Family: More than three times a week    Attends Religious Services: 1 to 4 times per year    Active Member of [employer] or Organizations: No    Attends Music therapist: 1 to 4 times per year    Marital Status: Married  Human resources officer Violence: Not At Risk (01/07/2022)   Humiliation, Afraid, Rape, and Kick questionnaire    Fear of Current or Ex-Partner: No    Emotionally Abused: No    Physically Abused: No    Sexually Abused: No    FAMILY HISTORY: Family History  Problem Relation Age of Onset   CAD Father 48   Asthma Father    Alcohol abuse Father    Arthritis Mother    Alcohol abuse Sister     ALLERGIES:  is allergic to short ragweed pollen ext.  MEDICATIONS:  Current Outpatient Medications  Medication Sig Dispense Refill   acetaminophen (TYLENOL) 325 MG tablet Take 2 tablets (650 mg total) by mouth every 6 (six) hours as needed for mild pain (or Fever >/= 101). 20 tablet    albuterol (VENTOLIN HFA) 108 (90 Base) MCG/ACT inhaler Inhale 2 puffs into the lungs every 6 (six) hours as needed for wheezing or shortness of breath.     alfuzosin (UROXATRAL) 10 MG 24 hr tablet TAKE 1 TABLET AT BEDTIME (Patient taking differently: Take 10 mg by mouth at bedtime.) 90 tablet 3   allopurinol (ZYLOPRIM) 100 MG tablet Take 1 tablet (100 mg total) by mouth 2 (two) times daily. (Patient not taking: Reported on 05/20/2022) 60 tablet 0   amoxicillin-clavulanate (AUGMENTIN) 875-125 MG tablet Take 1 tablet by mouth 2 (two) times daily. 20 tablet 1   atorvastatin (LIPITOR) 80 MG tablet Take 80 mg by mouth at bedtime.     buPROPion (WELLBUTRIN XL) 300 MG 24 hr tablet TAKE 1 TABLET EVERY DAY WITH BREAKFAST (Patient taking differently: Take 300 mg by mouth daily with breakfast.) 90 tablet 3   Calcium Citrate-Vitamin  D  (CALCIUM CITRATE + D PO) Take 1 tablet by mouth 2 (two) times daily. '1200mg'$  of Calcium and 1000units vitamin D3     Cholecalciferol (VITAMIN D-3 PO) Take 1 capsule by mouth 2 (two) times daily.     clonazePAM (KLONOPIN) 1 MG tablet Take 1 tablet (1 mg total) by mouth every 6 (six) hours as needed for anxiety. Take 1 tablet (1 mg total) by mouth every 6 (six) hours as needed. for anxiety Strength: 1 mg (Patient taking differently: Take 1 mg by mouth See admin instructions. Take one tablet (1 mg) by mouth daily with lunch, may also take one tablet (1 mg) at bedtime as needed for sleep) 10 tablet 0   CRANBERRY PO Take 2 tablets by mouth every morning.     denosumab (PROLIA) 60 MG/ML SOSY injection Inject 60 mg as directed every 6 (six) months.     dexamethasone (DECADRON) 4 MG tablet Take 5 tablets (20 mg total) by mouth daily. Start the day after carboplatin chemotherapy for 3 days. (Patient not taking: Reported on 06/02/2022) 30 tablet 3   diltiazem (CARDIZEM CD) 180 MG 24 hr capsule Take 1 capsule by mouth daily. 90 capsule 3   Docusate Sodium 100 MG capsule Take 100 mg by mouth 2 (two) times daily. (Patient not taking: Reported on 05/20/2022)     DULoxetine (CYMBALTA) 60 MG capsule TAKE 1 CAPSULE TWICE DAILY (Patient taking differently: Take 60 mg by mouth See admin instructions. Take one capsule (60 mg) by mouth twice daily - with lunch and supper) 180 capsule 3   EPINEPHrine 0.3 mg/0.3 mL IJ SOAJ injection Inject 0.3 mg into the muscle once as needed for anaphylaxis (severe allergic reaction). (Patient not taking: Reported on 06/02/2022)     ezetimibe (ZETIA) 10 MG tablet TAKE 1 TABLET EVERY DAY (Patient taking differently: Take 10 mg by mouth every morning.) 90 tablet 2   feeding supplement (ENSURE ENLIVE / ENSURE PLUS) LIQD Take 237 mLs by mouth 2 (two) times daily between meals. (Patient taking differently: Take 237 mLs by mouth daily.) 237 mL 12   ferrous sulfate 325 (65 FE) MG tablet Take 325 mg by  mouth 2 (two) times daily with a meal.      HYDROcodone-acetaminophen (NORCO) 7.5-325 MG tablet Take 1 tablet by mouth every 6 (six) hours as needed for severe pain. 10 tablet 0   levothyroxine (SYNTHROID) 100 MCG tablet Take 1 tablet (100 mcg total) by mouth daily before breakfast. 90 tablet 3   lidocaine-prilocaine (EMLA) cream Apply 1 Application topically as needed. 30 g 0   loratadine (CLARITIN) 10 MG tablet Take 10 mg by mouth every morning.     Magnesium 500 MG CAPS Take 1 capsule (500 mg total) by mouth daily. (Patient taking differently: Take 500 mg by mouth daily with supper.) 30 capsule 1   metoprolol tartrate (LOPRESSOR) 25 MG tablet Take 1 tablet (25 mg total) by mouth 2 (two) times daily. 180 tablet 3   Multiple Vitamin (MULTIVITAMIN WITH MINERALS) TABS tablet Take 1 tablet by mouth every morning. Men's One-A-Day 50+     MYRBETRIQ 25 MG TB24 tablet TAKE 1 TABLET EVERY DAY (Patient taking differently: Take 25 mg by mouth at bedtime.) 90 tablet 3   nitroGLYCERIN (NITROSTAT) 0.4 MG SL tablet Place 1 tablet (0.4 mg total) under the tongue every 5 (five) minutes as needed for chest pain (max 3 doses). (Patient not taking: Reported on 06/02/2022) 25 tablet 4   omeprazole (PRILOSEC) 40  MG capsule TAKE 1 CAPSULE EVERY DAY BEFORE BREAKFAST (Patient taking differently: Take 40 mg by mouth every morning.) 90 capsule 3   ondansetron (ZOFRAN) 8 MG tablet Take 1 tablet (8 mg total) by mouth 2 (two) times daily as needed. Start on the 3rd day after cisplatin chemotherapy. (Patient not taking: Reported on 06/02/2022) 30 tablet 1   polyethylene glycol (MIRALAX / GLYCOLAX) 17 g packet Take 17 g by mouth daily. (Patient not taking: Reported on 06/02/2022) 14 each 0   Potassium Citrate 15 MEQ (1620 MG) TBCR TAKE 1 TABLET TWICE DAILY (Patient taking differently: Take 1 tablet by mouth See admin instructions. Take one tablet (15 meq) by mouth twice daily - with lunch and supper) 180 tablet 3   Probiotic Product  (PROBIOTIC PO) Take 1 capsule by mouth daily with breakfast.     prochlorperazine (COMPAZINE) 10 MG tablet Take 1 tablet (10 mg total) by mouth every 6 (six) hours as needed (Nausea or vomiting). 30 tablet 1   rivaroxaban (XARELTO) 20 MG TABS tablet Take 1 tablet (20 mg total) by mouth daily with supper. 90 tablet 3   solifenacin (VESICARE) 10 MG tablet Take 10 mg by mouth daily with supper.     testosterone enanthate (DELATESTRYL) 200 MG/ML injection INJECT 1ML ('200MG'$ ) INTO THE MUSCLE EVERY 14 DAYS. DISCARD VIAL AFTER 28 DAYS. (Patient taking differently: Inject 100 mg into the muscle every 14 (fourteen) days. Every other Friday) 5 mL 3   vitamin C (ASCORBIC ACID) 500 MG tablet Take 500 mg by mouth daily with lunch.     No current facility-administered medications for this visit.    REVIEW OF SYSTEMS:   10 Point review of Systems was done is negative except as noted above.   PHYSICAL EXAMINATION:  ECOG FS:3 - Symptomatic, >50% confined to bed  Vitals:   05/31/22 1406  BP: 119/68  Pulse: 85  Resp: 16  Temp: 97.7 F (36.5 C)  SpO2: 94%    Wt Readings from Last 3 Encounters:  06/02/22 252 lb (114.3 kg)  05/31/22 252 lb (114.3 kg)  05/20/22 264 lb (119.7 kg)    Body mass index is 31.5 kg/m.   NAD GENERAL:alert, in no acute distress and comfortable SKIN: no acute rashes, no significant lesions EYES: conjunctiva are pink and non-injected, sclera anicteric OROPHARYNX: MMM, no exudates, no oropharyngeal erythema or ulceration NECK: supple, no JVD LYMPH:  no palpable lymphadenopathy in the cervical, axillary or inguinal regions LUNGS: clear to auscultation b/l with normal respiratory effort HEART: regular rate & rhythm ABDOMEN:  normoactive bowel sounds , non tender, not distended. Extremity: no pedal edema PSYCH: alert & oriented x 3 with fluent speech NEURO: no focal motor/sensory deficits   LABORATORY DATA:  I have reviewed the data as listed     Latest Ref Rng &  Units 05/31/2022    1:21 PM 05/24/2022    2:36 PM 05/10/2022    8:39 AM  CBC  WBC 4.0 - 10.5 K/uL 11.0  14.9  3.8   Hemoglobin 13.0 - 17.0 g/dL 13.0  12.0  11.1   Hematocrit 39.0 - 52.0 % 38.4  35.4  32.0   Platelets 150 - 400 K/uL 251  194  237        Latest Ref Rng & Units 05/31/2022    1:21 PM 05/24/2022    2:36 PM 05/10/2022    8:39 AM  CMP  Glucose 70 - 99 mg/dL 114  55  120   BUN  8 - 23 mg/dL '19  17  31   '$ Creatinine 0.61 - 1.24 mg/dL 1.21  1.30  0.98   Sodium 135 - 145 mmol/L 137  141  131   Potassium 3.5 - 5.1 mmol/L 4.2  3.9  4.2   Chloride 98 - 111 mmol/L 99  98  97   CO2 22 - 32 mmol/L 33  26  31   Calcium 8.9 - 10.3 mg/dL 10.4  9.4  8.3   Total Protein 6.5 - 8.1 g/dL 6.2  6.0  5.4   Total Bilirubin 0.3 - 1.2 mg/dL 0.8  0.8  1.0   Alkaline Phos 38 - 126 U/L 83   55   AST 15 - 41 U/L '21  24  28   '$ ALT 0 - 44 U/L 19  24  60    . Lab Results  Component Value Date   LDH 296 (H) 05/31/2022   06/28/2019 ECHOCARDIOGRAM COMPLETE (Accession [id number])   05/29/18 Left tonsil biopsy:   RADIOGRAPHIC STUDIES: I have personally reviewed the radiological images as listed and agreed with the findings in the report.   Marland KitchenNM PET Image Restag (PS) Skull Base To Thigh  Result Date: 05/20/2022 CLINICAL DATA:  Subsequent treatment strategy for large B-cell lymphoma. Status post 3 cycles of chemoimmunotherapy. EXAM: NUCLEAR MEDICINE PET SKULL BASE TO THIGH TECHNIQUE: 12.71 mCi F-18 FDG was injected intravenously. Full-ring PET imaging was performed from the skull base to thigh after the radiotracer. CT data was obtained and used for attenuation correction and anatomic localization. Fasting blood glucose: 150 mg/dl COMPARISON:  PET-CT 03/02/2022 FINDINGS: Mediastinal blood pool activity: SUV max 2.23 Liver activity: SUV max 3.42 NECK: The large right-sided posterior nasopharyngeal mass has significantly decreased in size since the prior study. Residual lesion measures approximately 19 x 18  mm and previously measured 45 x 24 mm. SUV max is 12.22 (Deauville 5) and was previously 28.49. No residual hypermetabolic lymphadenopathy. There is a persistent large soft tissue mass in the right lower neck measuring 4 cm on image 42/4. SUV max is 2.37 and was previously 2.58. Incidental CT findings: none CHEST: The right lower lobe lung mass is significantly smaller. Previously measured 4.3 cm and now measures 1.8 cm. SUV max is 2.58 (Deauville 3) and was previously 12.3. No enlarged or hypermetabolic mediastinal or hilar lymph nodes and no axillary adenopathy. No new worrisome pulmonary lesions or pulmonary nodules. Incidental CT findings: Stable atherosclerotic calcifications involving the aorta and coronary arteries. ABDOMEN/PELVIS: Significant interval decrease in size of the left pelvic adenopathy. The left obturator node measures 7.5 mm and previously measured 25 mm. SUV max is 5.67 (Deauville 4) was previously 33.49. Resolution of the left inguinal adenopathy seen on the prior study. The largest left inguinal node on the prior study measured 19 mm and had an SUV max of 16.8. This now measures 7 mm and is not demonstrate any hypermetabolism (Deauville 1). No mesenteric or retroperitoneal adenopathy. Normal size spleen. No hypermetabolism. Incidental CT findings: Stable advanced vascular disease. Stable anterior abdominal wall hernia containing fat. SKELETON: Diffuse hypermetabolic bone marrow likely due to chemotherapy or marrow stimulating drugs. There are persistent cutaneous/subcutaneous lesions in the left hip and buttock area. The largest lesion posterior to the upper gluteus maximus muscle measures 6.2 x 3.0 cm and previously measured 7.6 x 4.0 cm. SUV max is 16.22 (Deauville 5) and was previously 18.45. New diffuse hypermetabolism in the left gluteus minimus and medius muscles possibly related to trauma or injections.  I do not see any obvious abnormality on the CT scan. Incidental CT findings: none  IMPRESSION: Findings above suggests a good response to treatment as detailed above. The only new findings are muscular uptake in the left gluteal region possibly related to trauma or exercise or injections. Diffuse marrow uptake likely related to chemotherapy or marrow stimulating drugs. Electronically Signed   By: Marijo Sanes M.D.   On: 05/20/2022 13:56    ASSESSMENT & PLAN:   76 y.o. male with  1. Diffuse Large B-Cell Lymphoma,- Stage III Left tonsil -05/29/18 Left tonsil biopsy which revealed Diffuse Large B-Cell Lymphoma, which requires treatment  -06/07/18 LDH at 214, will monitor change through treatment.  -Baseline ECHO 06/14/18: EF 50-55% -Standard regimen of R-CHOP with G-CSF support for 6 cycles starting 06/23/18  06/16/18 PET/CT revealed 1. Deauville 5 hypermetabolic lesions in the palatine tonsils, left internal jugular chain, and involving a right inguinal lymph node. Deauville 4 involvement of a left inguinal lymph node and an enlarged subcutaneous lesion favoring lymph node superficial to the lower right sternocleidomastoid muscle in the neck. 2. Other imaging findings of potential clinical significance: Aortic Atherosclerosis. Coronary atherosclerosis. Emphysema. Bilateral nonobstructive nephrolithiasis. Umbilical hernia contains adipose tissues. Old right rib fractures some of which are nonunited. 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   08/22/18 PET/CT revealed Interval response to therapy. Overall Deauville criteria 3. Interval decrease in size and FDG uptake associated with palatine tonsil lesions. There has been resolution of previous left level-II and bilateral inguinal lymph nodes. 2. Enlarged subcutaneous lesion superficial to the lower right sternocleidomastoid muscle exhibits decreased FDG uptake. Currently Deauville criteria 2. 3. No new or progressive sites of disease identified. 4.  Aortic Atherosclerosis. 5. Multi vessel coronary artery atherosclerotic calcifications. 6. Unchanged vertebra  plana deformity. 7. Kidney stones.   S/p 6 cycles of R-CHOP completed on 10/12/18  11/22/18 PET/CT revealed The tonsillar mass has essentially resolved. The large presumed lymph node superficial to the right sternocleidomastoid is stable in size at 2.3 cm in short axis, with maximum SUV of 3.3 (formerly 2.4), representing Deauville 3 activity, previously Deauville 2. 2. Other imaging findings of potential clinical significance: Aortic Atherosclerosis and Emphysema. Coronary atherosclerosis. Bilateral nonobstructive nephrolithiasis. Umbilical hernia contains adipose tissue. Chronic high activity posterior to the right hip implant along a region of bony irregularity and deformity probably reflecting chronic inflammation. L1 vertebra plana.  2.  Recurrent large B-cell lymphoma with left buttock soft tissue mass, pulmonary mass, widespread lymphadenopathy  #3 acute on chronic renal insufficiency likely from dehydration cannot rule out urinary retention given patient's chronic bladder issues.  Patient follows with Dr. Junious Silk of urology  #4 altered mental status and failure to thrive in the context of poor p.o. intake and recent recurrent UTI  Recent MRI of the brain showed no evidence of lymphoma involvement  Plan -Labs done today were discussed with the patient in detail -He is still on treatment for his ESBL E. coli recurrent UTI and does have an upcoming appointment with urology to address his recurrent urinary tract infections. -His IV antibiotics at this time include ertapenem and are being managed by infectious disease. -We discussed his PET CT scan showing partial response. -We will schedule next cycle of R-GCD -Referral to [facility] for consideration of consolidative CAR-T cell therapies.  FOLLOW UP: Plz schedule next 2 cycles of R-GCD per orders Portflush and labs with D1 and D8 treatment MD visit with C4D8 and C5D1. thanks  The total time spent in the appointment  was 30  minutes*.  All of the patient's questions were answered with apparent satisfaction. The patient knows to call the clinic with any problems, questions or concerns.   Sullivan Lone MD MS AAHIVMS [facility] Hematology/Oncology Physician [facility]  .*Total Encounter Time as defined by the Centers for Medicare and Medicaid Services includes, in addition to the face-to-face time of a patient visit (documented in the note above) non-face-to-face time: obtaining and reviewing outside history, ordering and reviewing medications, tests or procedures, care coordination (communications with other health care professionals or caregivers) and documentation in the medical record.

## 2022-06-08 ENCOUNTER — Telehealth: Payer: Self-pay | Admitting: Internal Medicine

## 2022-06-08 DIAGNOSIS — A4151 Sepsis due to Escherichia coli [E. coli]: Secondary | ICD-10-CM | POA: Diagnosis not present

## 2022-06-08 NOTE — Telephone Encounter (Signed)
Patient has had multiple falls - last night and night before - right lower leg laceration - left knee brusing - patient does not want to be seen -

## 2022-06-08 NOTE — Progress Notes (Signed)
Referral faxed to [facility]/ Dr. Cassell Clement. Fax verification received.

## 2022-06-09 ENCOUNTER — Other Ambulatory Visit: Payer: Self-pay | Admitting: Urology

## 2022-06-10 DIAGNOSIS — N390 Urinary tract infection, site not specified: Secondary | ICD-10-CM | POA: Diagnosis not present

## 2022-06-11 ENCOUNTER — Emergency Department (HOSPITAL_COMMUNITY)
Admission: EM | Admit: 2022-06-11 | Discharge: 2022-06-14 | Disposition: A | Discharge status: Home / Self Care | Payer: Medicare PPO | Source: Home / Self Care | Attending: Emergency Medicine | Admitting: Emergency Medicine

## 2022-06-11 ENCOUNTER — Other Ambulatory Visit: Payer: Self-pay

## 2022-06-11 ENCOUNTER — Encounter (HOSPITAL_COMMUNITY): Payer: Self-pay | Admitting: Emergency Medicine

## 2022-06-11 ENCOUNTER — Emergency Department (HOSPITAL_COMMUNITY): Payer: Medicare PPO

## 2022-06-11 ENCOUNTER — Emergency Department (HOSPITAL_COMMUNITY)
Admission: EM | Admit: 2022-06-11 | Discharge: 2022-06-11 | Disposition: A | Discharge status: Home / Self Care | Payer: Medicare PPO | Attending: Emergency Medicine | Admitting: Emergency Medicine

## 2022-06-11 DIAGNOSIS — S0990XA Unspecified injury of head, initial encounter: Secondary | ICD-10-CM

## 2022-06-11 DIAGNOSIS — M50221 Other cervical disc displacement at C4-C5 level: Secondary | ICD-10-CM | POA: Diagnosis not present

## 2022-06-11 DIAGNOSIS — I110 Hypertensive heart disease with heart failure: Secondary | ICD-10-CM | POA: Insufficient documentation

## 2022-06-11 DIAGNOSIS — I10 Essential (primary) hypertension: Secondary | ICD-10-CM | POA: Diagnosis not present

## 2022-06-11 DIAGNOSIS — Z96641 Presence of right artificial hip joint: Secondary | ICD-10-CM | POA: Insufficient documentation

## 2022-06-11 DIAGNOSIS — I2510 Atherosclerotic heart disease of native coronary artery without angina pectoris: Secondary | ICD-10-CM | POA: Diagnosis not present

## 2022-06-11 DIAGNOSIS — R296 Repeated falls: Secondary | ICD-10-CM

## 2022-06-11 DIAGNOSIS — I5030 Unspecified diastolic (congestive) heart failure: Secondary | ICD-10-CM | POA: Insufficient documentation

## 2022-06-11 DIAGNOSIS — Z79899 Other long term (current) drug therapy: Secondary | ICD-10-CM | POA: Diagnosis not present

## 2022-06-11 DIAGNOSIS — E039 Hypothyroidism, unspecified: Secondary | ICD-10-CM | POA: Diagnosis not present

## 2022-06-11 DIAGNOSIS — Z87891 Personal history of nicotine dependence: Secondary | ICD-10-CM | POA: Insufficient documentation

## 2022-06-11 DIAGNOSIS — R531 Weakness: Secondary | ICD-10-CM | POA: Diagnosis not present

## 2022-06-11 DIAGNOSIS — I959 Hypotension, unspecified: Secondary | ICD-10-CM | POA: Diagnosis not present

## 2022-06-11 DIAGNOSIS — Z7901 Long term (current) use of anticoagulants: Secondary | ICD-10-CM | POA: Insufficient documentation

## 2022-06-11 DIAGNOSIS — I499 Cardiac arrhythmia, unspecified: Secondary | ICD-10-CM | POA: Diagnosis not present

## 2022-06-11 DIAGNOSIS — Z743 Need for continuous supervision: Secondary | ICD-10-CM | POA: Diagnosis not present

## 2022-06-11 DIAGNOSIS — Z96653 Presence of artificial knee joint, bilateral: Secondary | ICD-10-CM | POA: Insufficient documentation

## 2022-06-11 DIAGNOSIS — Z8572 Personal history of non-Hodgkin lymphomas: Secondary | ICD-10-CM | POA: Insufficient documentation

## 2022-06-11 DIAGNOSIS — R0902 Hypoxemia: Secondary | ICD-10-CM | POA: Diagnosis not present

## 2022-06-11 DIAGNOSIS — I951 Orthostatic hypotension: Secondary | ICD-10-CM | POA: Insufficient documentation

## 2022-06-11 DIAGNOSIS — S0093XA Contusion of unspecified part of head, initial encounter: Secondary | ICD-10-CM | POA: Insufficient documentation

## 2022-06-11 DIAGNOSIS — W19XXXA Unspecified fall, initial encounter: Secondary | ICD-10-CM | POA: Diagnosis not present

## 2022-06-11 DIAGNOSIS — W1839XA Other fall on same level, initial encounter: Secondary | ICD-10-CM | POA: Insufficient documentation

## 2022-06-11 DIAGNOSIS — S0083XA Contusion of other part of head, initial encounter: Secondary | ICD-10-CM | POA: Insufficient documentation

## 2022-06-11 LAB — CBC WITH DIFFERENTIAL/PLATELET
Abs Immature Granulocytes: 0.15 10*3/uL — ABNORMAL HIGH (ref 0.00–0.07)
Basophils Absolute: 0.1 10*3/uL (ref 0.0–0.1)
Basophils Relative: 1 %
Eosinophils Absolute: 0.5 10*3/uL (ref 0.0–0.5)
Eosinophils Relative: 4 %
HCT: 39 % (ref 39.0–52.0)
Hemoglobin: 12.7 g/dL — ABNORMAL LOW (ref 13.0–17.0)
Immature Granulocytes: 1 %
Lymphocytes Relative: 12 %
Lymphs Abs: 1.3 10*3/uL (ref 0.7–4.0)
MCH: 34.3 pg — ABNORMAL HIGH (ref 26.0–34.0)
MCHC: 32.6 g/dL (ref 30.0–36.0)
MCV: 105.4 fL — ABNORMAL HIGH (ref 80.0–100.0)
Monocytes Absolute: 1 10*3/uL (ref 0.1–1.0)
Monocytes Relative: 10 %
Neutro Abs: 7.7 10*3/uL (ref 1.7–7.7)
Neutrophils Relative %: 72 %
Platelets: 165 10*3/uL (ref 150–400)
RBC: 3.7 MIL/uL — ABNORMAL LOW (ref 4.22–5.81)
RDW: 19.3 % — ABNORMAL HIGH (ref 11.5–15.5)
WBC: 10.7 10*3/uL — ABNORMAL HIGH (ref 4.0–10.5)
nRBC: 0 % (ref 0.0–0.2)

## 2022-06-11 LAB — URINALYSIS, ROUTINE W REFLEX MICROSCOPIC
Bilirubin Urine: NEGATIVE
Glucose, UA: NEGATIVE mg/dL
Hgb urine dipstick: NEGATIVE
Ketones, ur: NEGATIVE mg/dL
Leukocytes,Ua: NEGATIVE
Nitrite: NEGATIVE
Protein, ur: NEGATIVE mg/dL
Specific Gravity, Urine: 1.017 (ref 1.005–1.030)
pH: 5 (ref 5.0–8.0)

## 2022-06-11 LAB — COMPREHENSIVE METABOLIC PANEL
ALT: 28 U/L (ref 0–44)
AST: 32 U/L (ref 15–41)
Albumin: 3.1 g/dL — ABNORMAL LOW (ref 3.5–5.0)
Alkaline Phosphatase: 71 U/L (ref 38–126)
Anion gap: 14 (ref 5–15)
BUN: 25 mg/dL — ABNORMAL HIGH (ref 8–23)
CO2: 28 mmol/L (ref 22–32)
Calcium: 10.8 mg/dL — ABNORMAL HIGH (ref 8.9–10.3)
Chloride: 99 mmol/L (ref 98–111)
Creatinine, Ser: 1.58 mg/dL — ABNORMAL HIGH (ref 0.61–1.24)
GFR, Estimated: 45 mL/min — ABNORMAL LOW (ref >60)
Glucose, Bld: 109 mg/dL — ABNORMAL HIGH (ref 70–99)
Potassium: 3.7 mmol/L (ref 3.5–5.1)
Sodium: 141 mmol/L (ref 135–145)
Total Bilirubin: 1.1 mg/dL (ref 0.3–1.2)
Total Protein: 5.7 g/dL — ABNORMAL LOW (ref 6.5–8.1)

## 2022-06-11 LAB — CK: Total CK: 67 U/L (ref 49–397)

## 2022-06-11 MED ORDER — SODIUM CHLORIDE 0.9 % IV BOLUS
1000.0000 mL | Freq: Once | INTRAVENOUS | Status: AC
  Administered 2022-06-11: 1000 mL via INTRAVENOUS

## 2022-06-11 NOTE — Discharge Instructions (Signed)
You were seen in the emergency department today after fall.  Your CT scans did not show any injuries.  Please drink plenty of fluids and follow with your primary care physician next week.  Return with any new or suddenly worsening symptoms.

## 2022-06-11 NOTE — ED Provider Notes (Signed)
[facility] EMERGENCY DEPARTMENT Provider Note  CSN: [id number] Arrival date & time: 06/11/22 0405  Chief Complaint(s) Fall  HPI [name] is a 76 y.o. male with extensive past medical history listed below including atrial fibrillation on Xarelto, diffuse large B-cell lymphoma undergoing chemotherapy, diastolic heart failure with last EF of 50 to 55% in 2020 who presents to the emergency department for fall at home resulting in head trauma.  Level 2 trauma activated by EMS due to fall on thinners.  Patient denies any loss of consciousness.  Does not remember hitting his head but he is got a small wound to the left occiput and small hematoma to the right cheek.  He is ANO x4 denies any headache, neck pain, back pain, chest pain, abdominal pain, hip pain or other extremity pain.  He did report feeling generally fatigued since he was being treated for urinary tract infection with IV medication.  He just completed his medication yesterday.   Fall    Past Medical History Past Medical History:  Diagnosis Date   Anticoagulant long-term use    xarelto--- takes for AFib,  managed by pcp, Dr Alain Marion   Benign localized prostatic hyperplasia with lower urinary tract symptoms (LUTS)    CAD (coronary artery disease) cardiologist--  dr t. Oval Linsey   hx of known CAD obstruction w/ collaterals (cath done @ [facility] 12-31-2011) ;   last cardiac cath 08-13-2019  showed sig. 2V CAD involving proxLAD and CTO of the RCA/  chronic total occlusion midRCA w/ bridging and L>R collaterals   Diastolic CHF ([place])    dx [phone] hospital admission (followed by pcp)   Diffuse large B cell lymphoma [facility]) oncologist-- dr Irene Limbo--- in remission   dx 08/ 2019 -- bx left tonsill mass-- involving lymph nodes--- completed chemotherapy 10-12-2018   Diffuse large B cell lymphoma ([place]) 05/24/2022   DJD (degenerative joint disease)    Dyspnea    Dysthymic disorder    Elevated brain natriuretic peptide  (BNP) level    Environmental allergies    GAD (generalized anxiety disorder)    GERD (gastroesophageal reflux disease)    History of falling    multiple times   History of Graves' disease    1987 s/p total thyroidectomy   History of kidney stones    History of scabies    07/ 2014  resolved   History of syncope    per D/C note in epic 02-21-2013 ?vasovagal   History of vertebral compression fracture    05/ 2014 --  L1, no surgical intervention   HLD (hyperlipidemia)    Hyperlipidemia    Hypertension    Hypogonadism in male    IDA (iron deficiency anemia)    Lumbago    OA (osteoarthritis)    OSA on CPAP    cpap machine-settings 17   Osteomyelitis of right wrist ([place]) 11/02/2012   Osteoporosis    Severe   Osteoporosis with fracture 04/24/2013   Pathological fracture due to osteoporosis with delayed healing 04/24/2013   Persistent atrial fibrillation [facility])    cardiologist--- dr Alfonso Patten. Marion   Plaque psoriasis    dermatologist--- dr Harriett Sine--- currently taking otezla   Post-surgical hypothyroidism    followed by pcp---  s/p total thyroidectomy for graves disease in 1987   Pure hypercholesterolemia 02/10/2021   Recurrent UTI 06/02/2022   Renal calculus, right    Right femoral fracture ([place]) 09/29/2012   Right forearm fracture 09/29/2012   Right tibial fracture 02/05/2013  Thrombocytopenia ([place])    Unsteady gait    uses walker   Wears hearing aid in both ears    Patient Active Problem List   Diagnosis Date Noted   Recurrent UTI 06/02/2022   Diffuse large B cell lymphoma ([place]) 05/24/2022   Falls frequently 05/20/2022   Orthostatic hypotension 04/27/2022   Sepsis secondary to UTI ([place]) 04/22/2022   Encounter for antineoplastic chemotherapy    AKI (acute kidney injury) ([place]) 03/19/2022   DNR (do not resuscitate)/DNI(Do Not Intubate) 03/19/2022   Lung mass 03/06/2022   Stage 3a chronic kidney disease (CKD) ([place]) - Baseline Scr 1.5 - 1.6 03/06/2022   Allergic  rhinitis 01/25/2022   Allergic rhinitis due to animal (cat) (dog) hair and dander 01/25/2022   Allergic rhinitis due to pollen 01/25/2022   Mild intermittent asthma 01/25/2022   Vasomotor rhinitis 01/25/2022   Multiple skin nodules 01/25/2022   Weight loss 01/25/2022   Pure hypercholesterolemia 02/10/2021   Chronic diastolic heart failure ([place]) 06/21/2019   H/O nephrolithotomy with removal of calculi 03/26/2019   Renal calculus 03/26/2019   History of chemotherapy 11/21/2018   Lymphoma in remission ([place]) 11/21/2018   Generalized weakness 11/08/2018   Well adult exam 10/02/2018   Counseling regarding advance care planning and goals of care 07/09/2018   Diffuse large B-cell lymphoma ([place]) 06/23/2018   Port-A-Cath in place 06/23/2018   Hypogonadism in male 11/22/2017   Nephrolithiasis 10/24/2017   Recurrent dislocation of left patella 07/15/2017   Recurrent subluxation of patella, left 07/08/2017   At high risk for falls 05/17/2017   S/P revision of total knee 04/11/2017   Failed total knee, left, initial encounter ([place]) 04/09/2017   Renal calculus, right 02/08/2017   Protein-calorie malnutrition, severe ([place]) 12/08/2016   Chronic obstructive pulmonary disease ([place]) 12/08/2016   Staghorn renal calculus 11/05/2016   Senile osteoporosis 06/08/2016   Dyslipidemia 01/12/2016   Insomnia secondary to depression with anxiety 12/02/2015   Generalized anxiety disorder 11/19/2015   Wound drainage right hip 11/15/2013   Expected blood loss anemia 09/18/2013   S/P right TH revision 09/17/2013   BPH with obstruction/lower urinary tract symptoms 08/03/2013   History of gastroesophageal reflux (GERD) 08/03/2013   OSA (obstructive sleep apnea) 08/03/2013   Paroxysmal atrial fibrillation ([place]) 08/03/2013   Presence of unspecified artificial hip joint 08/03/2013   Nonunion, fracture, Right tibia  07/03/2013   UTI (urinary tract infection) 07/03/2013   Chronic anticoagulation 02/21/2013   Fall  02/08/2013   Physical deconditioning 02/08/2013   Low back pain radiating to both legs 02/08/2013   Compression fracture of L1 lumbar vertebra ([place]) 02/08/2013   Morbid obesity ([place]) 02/05/2013   Anemia 10/06/2012   Essential hypertension 10/03/2012   Psoriasis 10/03/2012   Vitamin D deficiency 10/03/2012   GERD (gastroesophageal reflux disease) 10/03/2012   Anxiety 10/03/2012   Depression 10/03/2012   CAD (coronary artery disease) 10/03/2012   DJD (degenerative joint disease) 10/03/2012   Chronic gingivitis 10/03/2012   Low testosterone, possible hypogonadism 10/01/2012   Normocytic anemia 09/30/2012   Longstanding persistent atrial fibrillation ([place]) 09/29/2012   Hypothyroidism 09/29/2012   History of recurrent UTIs 09/29/2012   Home Medication(s) Prior to Admission medications   Medication Sig Start Date End Date Taking? Authorizing Provider  acetaminophen (TYLENOL) 325 MG tablet Take 2 tablets (650 mg total) by mouth every 6 (six) hours as needed for mild pain (or Fever >/= 101). 03/25/22   Raiford Noble Latif, DO  albuterol (VENTOLIN HFA) 108 [phone] Base) MCG/ACT  inhaler Inhale 2 puffs into the lungs every 6 (six) hours as needed for wheezing or shortness of breath.    [provider]  alfuzosin (UROXATRAL) 10 MG 24 hr tablet TAKE 1 TABLET AT BEDTIME Patient taking differently: Take 10 mg by mouth at bedtime. 03/19/21   McKenzie, Candee Furbish, MD  allopurinol (ZYLOPRIM) 100 MG tablet Take 1 tablet (100 mg total) by mouth 2 (two) times daily. Patient not taking: Reported on 05/20/2022 03/19/22   Brunetta Genera, MD  amoxicillin-clavulanate (AUGMENTIN) 875-125 MG tablet Take 1 tablet by mouth 2 (two) times daily. 05/20/22   Plotnikov, Evie Lacks, MD  atorvastatin (LIPITOR) 80 MG tablet Take 80 mg by mouth at bedtime.    [provider]  buPROPion (WELLBUTRIN XL) 300 MG 24 hr tablet TAKE 1 TABLET EVERY DAY WITH BREAKFAST Patient taking differently: Take 300 mg by mouth  daily with breakfast. 02/20/21   Plotnikov, Evie Lacks, MD  Calcium Citrate-Vitamin D (CALCIUM CITRATE + D PO) Take 1 tablet by mouth 2 (two) times daily. '1200mg'$  of Calcium and 1000units vitamin D3    [provider]  Cholecalciferol (VITAMIN D-3 PO) Take 1 capsule by mouth 2 (two) times daily.    [provider]  clonazePAM (KLONOPIN) 1 MG tablet Take 1 tablet (1 mg total) by mouth every 6 (six) hours as needed for anxiety. Take 1 tablet (1 mg total) by mouth every 6 (six) hours as needed. for anxiety Strength: 1 mg Patient taking differently: Take 1 mg by mouth See admin instructions. Take one tablet (1 mg) by mouth daily with lunch, may also take one tablet (1 mg) at bedtime as needed for sleep 03/26/22   Raiford Noble Latif, DO  CRANBERRY PO Take 2 tablets by mouth every morning.    [provider]  denosumab (PROLIA) 60 MG/ML SOSY injection Inject 60 mg as directed every 6 (six) months. 12/10/15   [provider]  dexamethasone (DECADRON) 4 MG tablet Take 5 tablets (20 mg total) by mouth daily. Start the day after carboplatin chemotherapy for 3 days. Patient not taking: Reported on 06/02/2022 03/19/22   Brunetta Genera, MD  diltiazem (CARDIZEM CD) 180 MG 24 hr capsule Take 1 capsule by mouth daily. 05/17/22   Loel Dubonnet, NP  Docusate Sodium 100 MG capsule Take 100 mg by mouth 2 (two) times daily. Patient not taking: Reported on 05/20/2022    [provider]  DULoxetine (CYMBALTA) 60 MG capsule TAKE 1 CAPSULE TWICE DAILY Patient taking differently: Take 60 mg by mouth See admin instructions. Take one capsule (60 mg) by mouth twice daily - with lunch and supper 03/23/21   Plotnikov, Evie Lacks, MD  EPINEPHrine 0.3 mg/0.3 mL IJ SOAJ injection Inject 0.3 mg into the muscle once as needed for anaphylaxis (severe allergic reaction). Patient not taking: Reported on 06/02/2022 07/31/21   [provider]  ezetimibe (ZETIA) 10 MG tablet TAKE 1 TABLET  EVERY DAY Patient taking differently: Take 10 mg by mouth every morning. 08/10/21   Skeet Latch, MD  feeding supplement (ENSURE ENLIVE / ENSURE PLUS) LIQD Take 237 mLs by mouth 2 (two) times daily between meals. Patient taking differently: Take 237 mLs by mouth daily. 03/25/22   Raiford Noble Latif, DO  ferrous sulfate 325 (65 FE) MG tablet Take 325 mg by mouth 2 (two) times daily with a meal.     [provider]  HYDROcodone-acetaminophen (NORCO) 7.5-325 MG tablet Take 1 tablet by mouth every 6 (  six) hours as needed for severe pain. 03/26/22   Raiford Noble Latif, DO  levothyroxine (SYNTHROID) 100 MCG tablet Take 1 tablet (100 mcg total) by mouth daily before breakfast. 05/24/22   Plotnikov, Evie Lacks, MD  lidocaine-prilocaine (EMLA) cream Apply 1 Application topically as needed. 04/21/22   Brunetta Genera, MD  loratadine (CLARITIN) 10 MG tablet Take 10 mg by mouth every morning.    [provider]  Magnesium 500 MG CAPS Take 1 capsule (500 mg total) by mouth daily. Patient taking differently: Take 500 mg by mouth daily with supper. 10/13/18   Brunetta Genera, MD  metoprolol tartrate (LOPRESSOR) 25 MG tablet Take 1 tablet (25 mg total) by mouth 2 (two) times daily. 05/17/22   Loel Dubonnet, NP  Multiple Vitamin (MULTIVITAMIN WITH MINERALS) TABS tablet Take 1 tablet by mouth every morning. Men's One-A-Day 50+    [provider]  MYRBETRIQ 25 MG TB24 tablet TAKE 1 TABLET EVERY DAY Patient taking differently: Take 25 mg by mouth at bedtime. 10/06/21   McKenzie, Candee Furbish, MD  nitroGLYCERIN (NITROSTAT) 0.4 MG SL tablet Place 1 tablet (0.4 mg total) under the tongue every 5 (five) minutes as needed for chest pain (max 3 doses). Patient not taking: Reported on 06/02/2022 05/17/22   Loel Dubonnet, NP  omeprazole (PRILOSEC) 40 MG capsule TAKE 1 [place] BREAKFAST Patient taking differently: Take 40 mg by mouth every morning. 06/18/21   Plotnikov,  Evie Lacks, MD  ondansetron (ZOFRAN) 8 MG tablet Take 1 tablet (8 mg total) by mouth 2 (two) times daily as needed. Start on the 3rd day after cisplatin chemotherapy. Patient not taking: Reported on 06/02/2022 03/19/22   Brunetta Genera, MD  polyethylene glycol (MIRALAX / GLYCOLAX) 17 g packet Take 17 g by mouth daily. Patient not taking: Reported on 06/02/2022 04/28/22   Regalado, Jerald Kief A, MD  Potassium Citrate 15 MEQ (1620 MG) TBCR Take 1 tablet by mouth See admin instructions. Take one tablet (15 meq) by mouth twice daily - with lunch and supper 06/10/22   McKenzie, Candee Furbish, MD  Probiotic Product (PROBIOTIC PO) Take 1 capsule by mouth daily with breakfast.    [provider]  prochlorperazine (COMPAZINE) 10 MG tablet Take 1 tablet (10 mg total) by mouth every 6 (six) hours as needed (Nausea or vomiting). 03/19/22   Brunetta Genera, MD  rivaroxaban (XARELTO) 20 MG TABS tablet Take 1 tablet (20 mg total) by mouth daily with supper. 05/26/22   Loel Dubonnet, NP  solifenacin (VESICARE) 10 MG tablet Take 10 mg by mouth daily with supper. 08/24/21   [provider]  testosterone enanthate (DELATESTRYL) 200 MG/ML injection INJECT 1ML ('200MG'$ ) INTO THE MUSCLE EVERY 14 DAYS. DISCARD VIAL AFTER 28 DAYS. Patient taking differently: Inject 100 mg into the muscle every 14 (fourteen) days. Every other Friday 09/22/21   Plotnikov, Evie Lacks, MD  vitamin C (ASCORBIC ACID) 500 MG tablet Take 500 mg by mouth daily with lunch.    [provider]  Allergies Short ragweed pollen ext  Review of Systems Review of Systems As noted in HPI  Physical Exam Vital Signs  I have reviewed the triage vital signs BP 112/60   Pulse 84   Temp 97.6 F (36.4 C) (Oral)   Resp (!) 21   Ht '6\' 2"'$  (1.88 m)   Wt 114 kg   SpO2 95%   BMI 32.27 kg/m   Physical  Exam Constitutional:      General: He is not in acute distress.    Appearance: He is well-developed. He is not diaphoretic.  HENT:     Head: Normocephalic. Abrasion and contusion present. No raccoon eyes, Battle's sign or laceration.      Right Ear: External ear normal.     Left Ear: External ear normal.  Eyes:     General: No scleral icterus.       Right eye: No discharge.        Left eye: No discharge.     Conjunctiva/sclera: Conjunctivae normal.     Pupils: Pupils are equal, round, and reactive to light.  Cardiovascular:     Rate and Rhythm: Regular rhythm.     Pulses:          Radial pulses are 2+ on the right side and 2+ on the left side.       Dorsalis pedis pulses are 2+ on the right side and 2+ on the left side.     Heart sounds: Normal heart sounds. No murmur heard.    No friction rub. No gallop.  Pulmonary:     Effort: Pulmonary effort is normal. No respiratory distress.     Breath sounds: Normal breath sounds. No stridor.  Abdominal:     General: There is no distension.     Palpations: Abdomen is soft.     Tenderness: There is no abdominal tenderness.  Musculoskeletal:     Cervical back: Normal range of motion and neck supple. No bony tenderness.     Thoracic back: No bony tenderness.     Lumbar back: No bony tenderness.     Comments: Clavicle stable. Chest stable to AP/Lat compression. Pelvis stable to Lat compression. No obvious extremity deformity. No chest or abdominal wall contusion.  Skin:    General: Skin is warm.  Neurological:     Mental Status: He is alert and oriented to person, place, and time.     GCS: GCS eye subscore is 4. GCS verbal subscore is 5. GCS motor subscore is 6.     Comments: Moving all extremities      ED Results and Treatments Labs (all labs ordered are listed, but only abnormal results are displayed) Labs Reviewed  CBC WITH DIFFERENTIAL/PLATELET - Abnormal; Notable for the following components:      Result Value   WBC 10.7  (*)    RBC 3.70 (*)    Hemoglobin 12.7 (*)    MCV 105.4 (*)    MCH 34.3 (*)    RDW 19.3 (*)    Abs Immature Granulocytes 0.15 (*)    All other components within normal limits  COMPREHENSIVE METABOLIC PANEL - Abnormal; Notable for the following components:   Glucose, Bld 109 (*)    BUN 25 (*)    Creatinine, Ser 1.58 (*)    Calcium 10.8 (*)    Total Protein 5.7 (*)    Albumin 3.1 (*)    GFR, Estimated 45 (*)    All other components within normal limits  URINE  CULTURE  CK  URINALYSIS, ROUTINE W REFLEX MICROSCOPIC                                                                                                                         EKG  EKG Interpretation  Date/Time:    Ventricular Rate:    PR Interval:    QRS Duration:   QT Interval:    QTC Calculation:   R Axis:     Text Interpretation:         Radiology CT Head Wo Contrast  Result Date: 06/11/2022 CLINICAL DATA:  76 year old male status post fall walking from bathroom. Posterior head wound. Weakness. Nasopharyngeal mass, large B-cell lymphoma. EXAM: CT HEAD WITHOUT CONTRAST TECHNIQUE: Contiguous axial images were obtained from the base of the skull through the vertex without intravenous contrast. RADIATION DOSE REDUCTION: This exam was performed according to the departmental dose-optimization program which includes automated exposure control, adjustment of the mA and/or kV according to patient size and/or use of iterative reconstruction technique. COMPARISON:  Brain MRI 03/07/2022.  Head CT 04/22/2022. FINDINGS: Brain: No midline shift, ventriculomegaly, mass effect, evidence of mass lesion, intracranial hemorrhage or evidence of cortically based acute infarction. Gray-white matter differentiation appears stable since 2018, with chronic and mostly frontal lobe subcortical white matter hypodensity. Vascular: Calcified atherosclerosis at the skull base. No suspicious intracranial vascular hyperdensity. Skull: Appears stable and  intact. No acute or suspicious osseous lesion identified. Sinuses/Orbits: Visualized paranasal sinuses and mastoids are stable and well aerated. Other: No discrete orbit or scalp soft tissue injury identified. There is a superficial soft tissue hematoma/contusion overlying the right zygomatic arch on series 3, image 20. Persistent right nasopharyngeal mass none with abnormal contour of the right Rosenmuller fossa, not significantly changed compared to 03/07/2022. IMPRESSION: 1. Superficial soft tissue injury at the visible right face, zygoma. No skull fracture identified. 2. No acute intracranial abnormality. Stable nonspecific white matter disease since 2018. 3. Known right nasopharyngeal mass stable since 03/07/2022 MRI. Electronically Signed   By: Genevie Ann M.D.   On: 06/11/2022 05:03    Medications Ordered in ED Medications  sodium chloride 0.9 % bolus 1,000 mL (1,000 mLs Intravenous New Bag/Given 06/11/22 0615)                                                                                                                                     Procedures .Critical  Care  Performed by: Fatima Blank, MD Authorized by: Fatima Blank, MD   Critical care provider statement:    Critical care time (minutes):  45   Critical care time was exclusive of:  Separately billable procedures and treating other patients   Critical care was necessary to treat or prevent imminent or life-threatening deterioration of the following conditions:  Circulatory failure   Critical care was time spent personally by me on the following activities:  Development of treatment plan with patient or surrogate, discussions with consultants, evaluation of patient's response to treatment, examination of patient, obtaining history from patient or surrogate, review of old charts, re-evaluation of patient's condition, pulse oximetry, ordering and review of radiographic studies, ordering and review of laboratory studies and  ordering and performing treatments and interventions   (including critical care time)  Medical Decision Making / ED Course   Medical Decision Making Amount and/or Complexity of Data Reviewed Labs: ordered. Decision-making details documented in ED Course. Radiology: ordered and independent interpretation performed. Decision-making details documented in ED Course.   Patient presents as a level 2 trauma for fall on blood thinners ABCs intact [place] as above Evidence of head trauma on exam. No other injuries noted. Patient is A&Ox4. The patient is alert and oriented, without evidence of intoxication. They have no midline cervical tenderness, distracting injuries, or neuro deficits. Cervical spine cleared by Nexus criteria. Collar removed.  Targeted trauma work-up ordered. CT head negative for ICH. Labs notable with stable leukocytosis and hemoglobin Metabolic panel without significant electrolyte derangements. Patient does have renal sufficiency without AKI.  During evaluation patient noted to have soft blood pressures.  Orthostatics obtained and positive with blood pressures dropping to the 80s upon sitting.. Patient was provided with IV fluids.  Plan to reevaluate and determine appropriate dispo.  Patient care turned over to oncoming provider. Patient case and results discussed in detail; please see their note for further ED managment.          Final Clinical Impression(s) / ED Diagnoses Final diagnoses:  Fall, initial encounter  Injury of head, initial encounter  Orthostatic hypotension           This chart was dictated using voice recognition software.  Despite best efforts to proofread,  errors can occur which can change the documentation meaning.    Fatima Blank, MD 06/11/22 [phone]

## 2022-06-11 NOTE — ED Notes (Signed)
Pt is currently ambulating with walker without assistance from NT

## 2022-06-11 NOTE — ED Triage Notes (Signed)
Patient BIB [facility] c/o multiple falls over the past 24 hours.  Patient reports falling multiple times since discharged this AM. Patient has hematoma to the right side of face.  Patient denies LOC, is on xarelto.   112/80 80-120 a fib 148 CBG 18 L Wrist

## 2022-06-11 NOTE — ED Notes (Signed)
The patient tolerated orthostatic vitals well. This EMT did not observe any orthostatic changes to the patient. This EMT assisted the patient in putting on a fresh brief and paper scrub pants to prepare for discharge.

## 2022-06-11 NOTE — Progress Notes (Signed)
Orthopedic Tech Progress Note Patient Details:  RODERIC LAMMERT 16-Feb-1946 [id number]  Patient ID: [name], male   DOB: 1945-12-01, 76 y.o.   MRN: [id number] Level 2 trauma not needed. Berenice Primas Demarius Archila 06/11/2022, 4:29 AM

## 2022-06-11 NOTE — ED Triage Notes (Signed)
Pt BIB [facility] from home, pt had a fall while walking back from the bathroom tonight, fell to his bottom, was unable to get up. Pt does have small wound to posterior head. Pt c/o weakness to bilateral legs, family reports some confusion over the last few days, GCS 15, answering questions appropriately.

## 2022-06-11 NOTE — ED Provider Notes (Signed)
Blood pressure 102/67, pulse 86, temperature 98.2 F (36.8 C), temperature source Oral, resp. rate 19, height '6\' 2"'$  (1.88 m), weight 114 kg, SpO2 92 %.  Assuming care from Dr. Leonette Monarch.  In short, [name] is a 76 y.o. male with a chief complaint of Fall .  Refer to the original H&P for additional details.  The current plan of care is to follow up after IVF and ambulation. Patient with weakness and orthostatic hypotension after recent ESBL UTI.  10:58 AM Patient feeling much better after IVF. He is ambulatory here with a walker (baseline) and "ready to go home." Orthostatics improved/normalized. Plan for d/c with PCP follow up plan.    Margette Fast, MD 06/11/22 1100

## 2022-06-12 ENCOUNTER — Emergency Department (HOSPITAL_COMMUNITY): Payer: Medicare PPO

## 2022-06-12 DIAGNOSIS — M50221 Other cervical disc displacement at C4-C5 level: Secondary | ICD-10-CM | POA: Diagnosis not present

## 2022-06-12 DIAGNOSIS — S0990XA Unspecified injury of head, initial encounter: Secondary | ICD-10-CM | POA: Diagnosis not present

## 2022-06-12 DIAGNOSIS — R296 Repeated falls: Secondary | ICD-10-CM | POA: Diagnosis not present

## 2022-06-12 LAB — URINE CULTURE

## 2022-06-12 MED ORDER — DILTIAZEM HCL ER COATED BEADS 180 MG PO CP24
180.0000 mg | ORAL_CAPSULE | Freq: Every day | ORAL | Status: DC
  Administered 2022-06-12 – 2022-06-14 (×3): 180 mg via ORAL
  Filled 2022-06-12 (×3): qty 1

## 2022-06-12 MED ORDER — LEVOTHYROXINE SODIUM 100 MCG PO TABS
100.0000 ug | ORAL_TABLET | Freq: Every day | ORAL | Status: DC
  Administered 2022-06-12 – 2022-06-14 (×3): 100 ug via ORAL
  Filled 2022-06-12 (×3): qty 1

## 2022-06-12 MED ORDER — VITAMIN C 500 MG PO TABS
500.0000 mg | ORAL_TABLET | Freq: Every day | ORAL | Status: DC
  Administered 2022-06-12 – 2022-06-14 (×2): 500 mg via ORAL
  Filled 2022-06-12 (×2): qty 1

## 2022-06-12 MED ORDER — HYDROCODONE-ACETAMINOPHEN 7.5-325 MG PO TABS: 1.0000 | ORAL_TABLET | Freq: Four times a day (QID) | ORAL | Status: DC | PRN

## 2022-06-12 MED ORDER — RIVAROXABAN 20 MG PO TABS
20.0000 mg | ORAL_TABLET | Freq: Every day | ORAL | Status: DC
  Administered 2022-06-12 – 2022-06-13 (×2): 20 mg via ORAL
  Filled 2022-06-12: qty 1
  Filled 2022-06-12: qty 2
  Filled 2022-06-12: qty 1

## 2022-06-12 MED ORDER — NITROGLYCERIN 0.4 MG SL SUBL: 0.4000 mg | SUBLINGUAL_TABLET | SUBLINGUAL | Status: DC | PRN

## 2022-06-12 MED ORDER — DULOXETINE HCL 60 MG PO CPEP
60.0000 mg | ORAL_CAPSULE | Freq: Two times a day (BID) | ORAL | Status: DC
  Administered 2022-06-12 – 2022-06-14 (×5): 60 mg via ORAL
  Filled 2022-06-12 (×2): qty 1
  Filled 2022-06-12: qty 2
  Filled 2022-06-12 (×2): qty 1
  Filled 2022-06-12: qty 2

## 2022-06-12 MED ORDER — ATORVASTATIN CALCIUM 80 MG PO TABS
80.0000 mg | ORAL_TABLET | Freq: Every day | ORAL | Status: DC
  Administered 2022-06-12 – 2022-06-13 (×2): 80 mg via ORAL
  Filled 2022-06-12: qty 2
  Filled 2022-06-12: qty 1

## 2022-06-12 MED ORDER — ALBUTEROL SULFATE (2.5 MG/3ML) 0.083% IN NEBU
2.5000 mg | INHALATION_SOLUTION | Freq: Four times a day (QID) | RESPIRATORY_TRACT | Status: DC | PRN
Start: 2022-06-12 — End: 2022-06-14

## 2022-06-12 MED ORDER — ALBUTEROL SULFATE HFA 108 (90 BASE) MCG/ACT IN AERS: 2.0000 | INHALATION_SPRAY | Freq: Four times a day (QID) | RESPIRATORY_TRACT | Status: DC | PRN

## 2022-06-12 MED ORDER — EZETIMIBE 10 MG PO TABS
10.0000 mg | ORAL_TABLET | Freq: Every day | ORAL | Status: DC
  Administered 2022-06-12 – 2022-06-14 (×3): 10 mg via ORAL
  Filled 2022-06-12 (×4): qty 1

## 2022-06-12 MED ORDER — PANTOPRAZOLE SODIUM 40 MG PO TBEC
40.0000 mg | DELAYED_RELEASE_TABLET | Freq: Every day | ORAL | Status: DC
  Administered 2022-06-12 – 2022-06-14 (×3): 40 mg via ORAL
  Filled 2022-06-12 (×3): qty 1

## 2022-06-12 MED ORDER — ALLOPURINOL 100 MG PO TABS
100.0000 mg | ORAL_TABLET | Freq: Two times a day (BID) | ORAL | Status: DC
  Administered 2022-06-12 – 2022-06-14 (×5): 100 mg via ORAL
  Filled 2022-06-12 (×6): qty 1

## 2022-06-12 MED ORDER — CLONAZEPAM 0.5 MG PO TABS: 1.0000 mg | ORAL_TABLET | Freq: Two times a day (BID) | ORAL | Status: DC | PRN

## 2022-06-12 MED ORDER — LORATADINE 10 MG PO TABS
10.0000 mg | ORAL_TABLET | Freq: Every morning | ORAL | Status: DC
  Administered 2022-06-12 – 2022-06-14 (×3): 10 mg via ORAL
  Filled 2022-06-12 (×3): qty 1

## 2022-06-12 MED ORDER — BUPROPION HCL ER (XL) 150 MG PO TB24
300.0000 mg | ORAL_TABLET | Freq: Every day | ORAL | Status: DC
  Administered 2022-06-12 – 2022-06-14 (×3): 300 mg via ORAL
  Filled 2022-06-12 (×3): qty 1
  Filled 2022-06-12: qty 2

## 2022-06-12 MED ORDER — EPINEPHRINE 0.3 MG/0.3ML IJ SOAJ: 0.3000 mg | Freq: Once | INTRAMUSCULAR | Status: DC | PRN

## 2022-06-12 MED ORDER — ACETAMINOPHEN 325 MG PO TABS
650.0000 mg | ORAL_TABLET | Freq: Four times a day (QID) | ORAL | Status: DC | PRN
Start: 2022-06-12 — End: 2022-06-14

## 2022-06-12 MED ORDER — CLONAZEPAM 0.5 MG PO TABS: 1.0000 mg | ORAL_TABLET | ORAL | Status: DC

## 2022-06-12 MED ORDER — AMOXICILLIN-POT CLAVULANATE 875-125 MG PO TABS
1.0000 | ORAL_TABLET | Freq: Two times a day (BID) | ORAL | Status: DC
  Administered 2022-06-12 – 2022-06-14 (×6): 1 via ORAL
  Filled 2022-06-12 (×6): qty 1

## 2022-06-12 MED ORDER — ALFUZOSIN HCL ER 10 MG PO TB24
10.0000 mg | ORAL_TABLET | Freq: Every day | ORAL | Status: DC
  Administered 2022-06-12 – 2022-06-13 (×2): 10 mg via ORAL
  Filled 2022-06-12 (×4): qty 1

## 2022-06-12 MED ORDER — METOPROLOL TARTRATE 25 MG PO TABS
25.0000 mg | ORAL_TABLET | Freq: Two times a day (BID) | ORAL | Status: DC
  Administered 2022-06-12 – 2022-06-14 (×6): 25 mg via ORAL
  Filled 2022-06-12 (×6): qty 1

## 2022-06-12 NOTE — ED Notes (Signed)
Per pt has to have a BM. Requested NT assistance to place pt on bedside commode at this time. Pt bed is wet from urine. Sheets changed.

## 2022-06-12 NOTE — ED Notes (Signed)
Patient's spouse at bedside

## 2022-06-12 NOTE — ED Notes (Signed)
Pt placed back into bed with NT Lanny Hurst assistance and gown changed at this time

## 2022-06-12 NOTE — ED Notes (Signed)
Patient in NAD, respirations even and unlabored.  Spouse remains at bedside.

## 2022-06-12 NOTE — ED Notes (Signed)
Patient stood up at bedside with heavy 2 person assist in attempt to let patient urinate.  Patient unable to stand on own for any period of time.  Patient and bed linens soiled so patient cleaned and peri-care provided and linens changed.

## 2022-06-12 NOTE — ED Notes (Signed)
Pt transferred into hospital bed.

## 2022-06-12 NOTE — Evaluation (Signed)
Physical Therapy Evaluation Patient Details Name: CODEY BURLING MRN: [id number] DOB: 1946/05/15 Today's Date: 06/12/2022  History of Present Illness  76 y.o. male who presents to the emergency department 06/11/22 for fall at home resulting in head trauma. CT head negative for ICH. +orthostasis; discharged home from ED 9/1 and returned same day due to multiple falls;  PMH including atrial fibrillation on Xarelto, diffuse large B-cell lymphoma undergoing chemotherapy, diastolic heart failure with last EF of 50 to 55% in 2020  Clinical Impression   Pt admitted secondary to problem above with deficits below. PTA patient was walking with RW but having multiple falls at home. Reports his knees get weak and then he falls. Denies feeling dizzy or lightheaded.  Pt currently requires min assist to walk a maximum of 12 ft with RW with poor safety awareness and poor use of RW. He has 4 teps to climb to enter home and currently is not safe to attempt. Patient agrees he needs to go for ST-SNF and prefers to go to [facility].  Anticipate patient will benefit from PT to address problems listed below.Will continue to follow acutely to maximize functional mobility independence and safety.          Recommendations for follow up therapy are one component of a multi-disciplinary discharge planning process, led by the attending physician.  Recommendations may be updated based on patient status, additional functional criteria and insurance authorization.  Follow Up Recommendations Skilled nursing-short term rehab (<3 hours/day) Can patient physically be transported by private vehicle: No    Assistance Recommended at Discharge Frequent or constant Supervision/Assistance  Patient can return home with the following  Two people to help with walking and/or transfers;A lot of help with bathing/dressing/bathroom;Assistance with cooking/housework;Assist for transportation;Help with stairs or ramp for entrance     Equipment Recommendations None recommended by PT  Recommendations for Other Services  OT consult    Functional Status Assessment Patient has had a recent decline in their functional status and demonstrates the ability to make significant improvements in function in a reasonable and predictable amount of time.     Precautions / Restrictions Precautions Precautions: Fall      Mobility  Bed Mobility Overal bed mobility: Needs Assistance Bed Mobility: Supine to Sit, Sit to Supine     Supine to sit: Min guard Sit to supine: Min guard   General bed mobility comments: on ED stretcher, near need for assist as coming to sit with HOB elevated; near need for assist to raise legs onto stretcher    Transfers Overall transfer level: Needs assistance Equipment used: Rolling walker (2 wheels) Transfers: Sit to/from Stand Sit to Stand: Min assist, From elevated surface           General transfer comment: from ED stretcher (elevated), min steadying assist with vc for proper sequencing/use of RW    Ambulation/Gait Ambulation/Gait assistance: Min assist Gait Distance (Feet): 12 Feet Assistive device: Rolling walker (2 wheels) (wide) Gait Pattern/deviations: Step-to pattern, Decreased stride length, Decreased weight shift to left, Shuffle (trunk laterally flexed/scoliosis)   Gait velocity interpretation: <1.31 ft/sec, indicative of household ambulator   General Gait Details: pt's shoulders lean to his right and is left foot almost walks outside RW (with pt kicking left back leg of RW multiple times) because of his shift he cannot walk as close to RW as he should for safety  Stairs            Wheelchair Mobility    Modified Rankin (Stroke  Patients Only)       Balance Overall balance assessment: Needs assistance, History of Falls Sitting-balance support: No upper extremity supported, Feet supported Sitting balance-Leahy Scale: Fair     Standing balance support: Bilateral  upper extremity supported, Reliant on assistive device for balance Standing balance-Leahy Scale: Poor                               Pertinent Vitals/Pain Pain Assessment Pain Assessment: No/denies pain    Home Living Family/patient expects to be discharged to:: Private residence Living Arrangements: Spouse/significant other Available Help at Discharge: Available 24 hours/day Type of Home: House (town house) Home Access: Stairs to enter Entrance Stairs-Rails: Right Entrance Stairs-Number of Steps: 4 Alternate Level Stairs-Number of Steps: has chair lift - still needs to get up 4 steps to chair lift Home Layout: Two level Home Equipment: Conservation officer, nature (2 wheels);Cane - single point;Wheelchair - manual;Shower seat      Prior Function Prior Level of Function : Independent/Modified Independent;Driving             Mobility Comments: amb with RW in home and community ADLs Comments: independent with ADLs     Hand Dominance   Dominant Hand: Left    Extremity/Trunk Assessment   Upper Extremity Assessment Upper Extremity Assessment: Generalized weakness    Lower Extremity Assessment Lower Extremity Assessment: Generalized weakness (at least 3/5 bil)    Cervical / Trunk Assessment Cervical / Trunk Assessment: Other exceptions Cervical / Trunk Exceptions: scoliosis evident in standing  Communication   Communication: No difficulties  Cognition Arousal/Alertness: Awake/alert Behavior During Therapy: WFL for tasks assessed/performed Overall Cognitive Status: Within Functional Limits for tasks assessed                                          General Comments      Exercises     Assessment/Plan    PT Assessment Patient needs continued PT services  PT Problem List Decreased strength       PT Treatment Interventions DME instruction;Gait training;Functional mobility training;Therapeutic activities;Therapeutic exercise;Balance  training;Patient/family education    PT Goals (Current goals can be found in the Care Plan section)  Acute Rehab PT Goals Patient Stated Goal: go home, but understands he needs rehab first PT Goal Formulation: With patient Time For Goal Achievement: 06/26/22 Potential to Achieve Goals: Good    Frequency Min 3X/week     Co-evaluation               AM-PAC PT "6 Clicks" Mobility  Outcome Measure Help needed turning from your back to your side while in a flat bed without using bedrails?: A Little Help needed moving from lying on your back to sitting on the side of a flat bed without using bedrails?: A Little Help needed moving to and from a bed to a chair (including a wheelchair)?: A Little Help needed standing up from a chair using your arms (e.g., wheelchair or bedside chair)?: A Lot Help needed to walk in hospital room?: A Lot Help needed climbing 3-5 steps with a railing? : Total 6 Click Score: 14    End of Session Equipment Utilized During Treatment: Gait belt Activity Tolerance: Patient limited by fatigue Patient left: in bed;with call bell/phone within reach (on ED stretcher) Nurse Communication: Mobility status PT Visit Diagnosis: Repeated falls (R29.6)  Time: 1020-1105 PT Time Calculation (min) (ACUTE ONLY): 45 min   Charges:   PT Evaluation $PT Eval Moderate Complexity: 1 Mod PT Treatments $Gait Training: 8-22 mins         Arby Barrette, PT Acute Rehabilitation Services  Office [phone]   Rexanne Mano 06/12/2022, 11:14 AM

## 2022-06-12 NOTE — ED Notes (Signed)
Physical Therapy at bedside.

## 2022-06-12 NOTE — NC FL2 (Signed)
[place] LEVEL OF CARE SCREENING TOOL     IDENTIFICATION  Patient Name: [name] Birthdate: 01-Oct-1946 Sex: male Admission Date (Current Location): 06/11/2022  [facility] and [place] Number:  Herbalist and Address:  [facility], [place] [address], [place], [place] [address]      Provider Number: [id number]  Attending Physician Name and Address:  Default, Provider, MD  Relative Name and Phone Number:       Current Level of Care: Hospital Recommended Level of Care: [place] Prior Approval Number:    Date Approved/Denied: 06/12/22 PASRR Number: [id number]  Discharge Plan: SNF    Current Diagnoses: Patient Active Problem List   Diagnosis Date Noted   Recurrent UTI 06/02/2022   Diffuse large B cell lymphoma ([place]) 05/24/2022   Falls frequently 05/20/2022   Orthostatic hypotension 04/27/2022   Sepsis secondary to UTI ([place]) 04/22/2022   Encounter for antineoplastic chemotherapy    AKI (acute kidney injury) ([place]) 03/19/2022   DNR (do not resuscitate)/DNI(Do Not Intubate) 03/19/2022   Lung mass 03/06/2022   Stage 3a chronic kidney disease (CKD) ([place]) - Baseline Scr 1.5 - 1.6 03/06/2022   Allergic rhinitis 01/25/2022   Allergic rhinitis due to animal (cat) (dog) hair and dander 01/25/2022   Allergic rhinitis due to pollen 01/25/2022   Mild intermittent asthma 01/25/2022   Vasomotor rhinitis 01/25/2022   Multiple skin nodules 01/25/2022   Weight loss 01/25/2022   Pure hypercholesterolemia 02/10/2021   Chronic diastolic heart failure ([place]) 06/21/2019   H/O nephrolithotomy with removal of calculi 03/26/2019   Renal calculus 03/26/2019   History of chemotherapy 11/21/2018   Lymphoma in remission ([place]) 11/21/2018   Generalized weakness 11/08/2018   Well adult exam 10/02/2018   Counseling regarding advance care planning and goals of care 07/09/2018   Diffuse large B-cell lymphoma ([place]) 06/23/2018    Port-A-Cath in place 06/23/2018   Hypogonadism in male 11/22/2017   Nephrolithiasis 10/24/2017   Recurrent dislocation of left patella 07/15/2017   Recurrent subluxation of patella, left 07/08/2017   At high risk for falls 05/17/2017   S/P revision of total knee 04/11/2017   Failed total knee, left, initial encounter ([place]) 04/09/2017   Renal calculus, right 02/08/2017   Protein-calorie malnutrition, severe ([place]) 12/08/2016   Chronic obstructive pulmonary disease ([place]) 12/08/2016   [place] renal calculus 11/05/2016   Senile osteoporosis 06/08/2016   Dyslipidemia 01/12/2016   Insomnia secondary to depression with anxiety 12/02/2015   Generalized anxiety disorder 11/19/2015   Wound drainage right hip 11/15/2013   Expected blood loss anemia 09/18/2013   S/P right TH revision 09/17/2013   BPH with obstruction/lower urinary tract symptoms 08/03/2013   History of gastroesophageal reflux (GERD) 08/03/2013   OSA (obstructive sleep apnea) 08/03/2013   Paroxysmal atrial fibrillation ([place]) 08/03/2013   Presence of unspecified artificial hip joint 08/03/2013   Nonunion, fracture, Right tibia  07/03/2013   UTI (urinary tract infection) 07/03/2013   Chronic anticoagulation 02/21/2013   Fall 02/08/2013   Physical deconditioning 02/08/2013   Low back pain radiating to both legs 02/08/2013   Compression fracture of L1 lumbar vertebra ([place]) 02/08/2013   Morbid obesity ([place]) 02/05/2013   Anemia 10/06/2012   Essential hypertension 10/03/2012   Psoriasis 10/03/2012   Vitamin D deficiency 10/03/2012   GERD (gastroesophageal reflux disease) 10/03/2012   Anxiety 10/03/2012   Depression 10/03/2012   CAD (coronary artery disease) 10/03/2012   DJD (degenerative joint disease) 10/03/2012   Chronic  gingivitis 10/03/2012   Low testosterone, possible hypogonadism 10/01/2012   Normocytic anemia 09/30/2012   Longstanding persistent atrial fibrillation ([place]) 09/29/2012   Hypothyroidism 09/29/2012    History of recurrent UTIs 09/29/2012    Orientation RESPIRATION BLADDER Height & Weight     Self, Time, Situation, Place  Normal Continent Weight: 252 lb (114.3 kg) Height:  '6\' 2"'$  (188 cm)  BEHAVIORAL SYMPTOMS/MOOD NEUROLOGICAL BOWEL NUTRITION STATUS      Continent    AMBULATORY STATUS COMMUNICATION OF NEEDS Skin   Extensive Assist Verbally Normal                       Personal Care Assistance Level of Assistance  Bathing Bathing Assistance: Maximum assistance         Functional Limitations Info  Sight, Hearing, Speech Sight Info: Adequate Hearing Info: Adequate Speech Info: Adequate    SPECIAL CARE FACTORS FREQUENCY                       Contractures Contractures Info: Not present    Additional Factors Info  Code Status, Allergies Code Status Info: Prior Allergies Info: Short Ragweed Pollen Ext           Current Medications (06/12/2022):  This is the current hospital active medication list Current Facility-Administered Medications  Medication Dose Route Frequency Provider Last Rate Last Admin   acetaminophen (TYLENOL) tablet 650 mg  650 mg Oral Q6H PRN Maudie Flakes, MD       albuterol (PROVENTIL) (2.5 MG/3ML) 0.083% nebulizer solution 2.5 mg  2.5 mg Nebulization Q6H PRN Maudie Flakes, MD       alfuzosin (UROXATRAL) 24 hr tablet 10 mg  10 mg Oral QHS Maudie Flakes, MD       allopurinol (ZYLOPRIM) tablet 100 mg  100 mg Oral BID Maudie Flakes, MD   100 mg at 06/12/22 0813   amoxicillin-clavulanate (AUGMENTIN) 875-125 MG per tablet 1 tablet  1 tablet Oral BID Maudie Flakes, MD   1 tablet at 06/12/22 1014   ascorbic acid (VITAMIN C) tablet 500 mg  500 mg Oral Q lunch Maudie Flakes, MD   500 mg at 06/12/22 1111   atorvastatin (LIPITOR) tablet 80 mg  80 mg Oral QHS Maudie Flakes, MD       buPROPion (WELLBUTRIN XL) 24 hr tablet 300 mg  300 mg Oral Q breakfast Maudie Flakes, MD   300 mg at 06/12/22 0815   clonazePAM (KLONOPIN) tablet 1 mg  1 mg  Oral BID PRN Maudie Flakes, MD       diltiazem (CARDIZEM CD) 24 hr capsule 180 mg  180 mg Oral Daily Maudie Flakes, MD   180 mg at 06/12/22 1015   DULoxetine (CYMBALTA) DR capsule 60 mg  60 mg Oral BID Maudie Flakes, MD   60 mg at 06/12/22 0813   EPINEPHrine (EPI-PEN) injection 0.3 mg  0.3 mg Intramuscular Once PRN Maudie Flakes, MD       ezetimibe (ZETIA) tablet 10 mg  10 mg Oral Daily Maudie Flakes, MD   10 mg at 06/12/22 1014   HYDROcodone-acetaminophen (NORCO) 7.5-325 MG per tablet 1 tablet  1 tablet Oral Q6H PRN Maudie Flakes, MD       levothyroxine (SYNTHROID) tablet 100 mcg  100 mcg Oral QAC breakfast Maudie Flakes, MD   100 mcg at 06/12/22 0813   loratadine (CLARITIN) tablet 10  mg  10 mg Oral q morning Maudie Flakes, MD   10 mg at 06/12/22 1014   metoprolol tartrate (LOPRESSOR) tablet 25 mg  25 mg Oral BID Maudie Flakes, MD   25 mg at 06/12/22 1014   nitroGLYCERIN (NITROSTAT) SL tablet 0.4 mg  0.4 mg Sublingual Q5 min PRN Maudie Flakes, MD       pantoprazole (PROTONIX) EC tablet 40 mg  40 mg Oral Daily Maudie Flakes, MD   40 mg at 06/12/22 1014   rivaroxaban (XARELTO) tablet 20 mg  20 mg Oral Q supper Maudie Flakes, MD       Current Outpatient Medications  Medication Sig Dispense Refill   acetaminophen (TYLENOL) 325 MG tablet Take 2 tablets (650 mg total) by mouth every 6 (six) hours as needed for mild pain (or Fever >/= 101). 20 tablet    albuterol (VENTOLIN HFA) 108 (90 Base) MCG/ACT inhaler Inhale 2 puffs into the lungs every 6 (six) hours as needed for wheezing or shortness of breath.     atorvastatin (LIPITOR) 80 MG tablet Take 80 mg by mouth at bedtime.     buPROPion (WELLBUTRIN XL) 300 MG 24 hr tablet TAKE 1 TABLET EVERY DAY WITH BREAKFAST (Patient taking differently: Take 300 mg by mouth daily with breakfast.) 90 tablet 3   Calcium Citrate-Vitamin D (CALCIUM CITRATE + D PO) Take 1 tablet by mouth 2 (two) times daily. '1200mg'$  of Calcium and 1000 units vitamin  D3     clonazePAM (KLONOPIN) 1 MG tablet Take 1 tablet (1 mg total) by mouth every 6 (six) hours as needed for anxiety. Take 1 tablet (1 mg total) by mouth every 6 (six) hours as needed. for anxiety Strength: 1 mg (Patient taking differently: Take 1 mg by mouth See admin instructions. Take one tablet (1 mg) by mouth daily with lunch, may also take one tablet (1 mg) at bedtime as needed for sleep) 10 tablet 0   CRANBERRY PO Take 2 tablets by mouth every morning.     denosumab (PROLIA) 60 MG/ML SOSY injection Inject 60 mg as directed every 6 (six) months.     Docusate Sodium 100 MG capsule Take 100 mg by mouth 2 (two) times daily.     DULoxetine (CYMBALTA) 60 MG capsule TAKE 1 CAPSULE TWICE DAILY (Patient taking differently: Take 60 mg by mouth 2 (two) times daily.) 180 capsule 3   feeding supplement (ENSURE ENLIVE / ENSURE PLUS) LIQD Take 237 mLs by mouth 2 (two) times daily between meals. (Patient taking differently: Take 237 mLs by mouth daily.) 237 mL 12   HYDROcodone-acetaminophen (NORCO) 7.5-325 MG tablet Take 1 tablet by mouth every 6 (six) hours as needed for severe pain. 10 tablet 0   levothyroxine (SYNTHROID) 100 MCG tablet Take 1 tablet (100 mcg total) by mouth daily before breakfast. 90 tablet 3   lidocaine-prilocaine (EMLA) cream Apply 1 Application topically as needed. (Patient taking differently: Apply 1 Application topically as needed (For port access/chemotherapy).) 30 g 0   loratadine (CLARITIN) 10 MG tablet Take 10 mg by mouth every morning.     Magnesium 500 MG CAPS Take 1 capsule (500 mg total) by mouth daily. (Patient taking differently: Take 500 mg by mouth daily with supper.) 30 capsule 1   Menthol, Topical Analgesic, (BIOFREEZE) 10 % CREA Apply 1 Application topically daily as needed (Knee pain).     Multiple Vitamin (MULTIVITAMIN WITH MINERALS) TABS tablet Take 1 tablet by mouth every morning. Men's  One-A-Day 50+     MYRBETRIQ 25 MG TB24 tablet TAKE 1 TABLET EVERY DAY (Patient  taking differently: Take 25 mg by mouth at bedtime.) 90 tablet 3   Potassium Citrate 15 MEQ (1620 MG) TBCR Take 1 tablet by mouth See admin instructions. Take one tablet (15 meq) by mouth twice daily - with lunch and supper (Patient taking differently: Take 1 tablet by mouth in the morning and at bedtime.) 180 tablet 3   Probiotic Product (PROBIOTIC PO) Take 1 capsule by mouth daily with breakfast.     rivaroxaban (XARELTO) 20 MG TABS tablet Take 1 tablet (20 mg total) by mouth daily with supper. 90 tablet 3   testosterone enanthate (DELATESTRYL) 200 MG/ML injection INJECT 1ML ('200MG'$ ) INTO THE MUSCLE EVERY 14 DAYS. DISCARD VIAL AFTER 28 DAYS. (Patient taking differently: Inject 200 mg into the muscle every 14 (fourteen) days. Every other Friday) 5 mL 3   vitamin C (ASCORBIC ACID) 500 MG tablet Take 500 mg by mouth daily with lunch.     alfuzosin (UROXATRAL) 10 MG 24 hr tablet TAKE 1 TABLET AT BEDTIME (Patient not taking: Reported on 06/11/2022) 90 tablet 3   allopurinol (ZYLOPRIM) 100 MG tablet Take 1 tablet (100 mg total) by mouth 2 (two) times daily. (Patient not taking: Reported on 05/20/2022) 60 tablet 0   amoxicillin-clavulanate (AUGMENTIN) 875-125 MG tablet Take 1 tablet by mouth 2 (two) times daily. (Patient not taking: Reported on 06/11/2022) 20 tablet 1   dexamethasone (DECADRON) 4 MG tablet Take 5 tablets (20 mg total) by mouth daily. Start the day after carboplatin chemotherapy for 3 days. (Patient not taking: Reported on 06/02/2022) 30 tablet 3   diltiazem (CARDIZEM CD) 180 MG 24 hr capsule Take 1 capsule by mouth daily. (Patient not taking: Reported on 06/11/2022) 90 capsule 3   EPINEPHrine 0.3 mg/0.3 mL IJ SOAJ injection Inject 0.3 mg into the muscle once as needed for anaphylaxis (severe allergic reaction). (Patient not taking: Reported on 06/02/2022)     ezetimibe (ZETIA) 10 MG tablet TAKE 1 TABLET EVERY DAY (Patient not taking: Reported on 06/11/2022) 90 tablet 2   metoprolol tartrate (LOPRESSOR)  25 MG tablet Take 1 tablet (25 mg total) by mouth 2 (two) times daily. (Patient not taking: Reported on 06/11/2022) 180 tablet 3   nitroGLYCERIN (NITROSTAT) 0.4 MG SL tablet Place 1 tablet (0.4 mg total) under the tongue every 5 (five) minutes as needed for chest pain (max 3 doses). (Patient not taking: Reported on 06/02/2022) 25 tablet 4   omeprazole (PRILOSEC) 40 MG capsule TAKE 1 CAPSULE EVERY DAY BEFORE BREAKFAST (Patient not taking: Reported on 06/11/2022) 90 capsule 3   ondansetron (ZOFRAN) 8 MG tablet Take 1 tablet (8 mg total) by mouth 2 (two) times daily as needed. Start on the 3rd day after cisplatin chemotherapy. (Patient not taking: Reported on 06/02/2022) 30 tablet 1   prochlorperazine (COMPAZINE) 10 MG tablet Take 1 tablet (10 mg total) by mouth every 6 (six) hours as needed (Nausea or vomiting). (Patient not taking: Reported on 06/11/2022) 30 tablet 1     Discharge Medications: Please see discharge summary for a list of discharge medications.  Relevant Imaging Results:  Relevant Lab Results:   Additional Information [place] Jimmy Stipes LCSW-A   SSI# [id number]  Rodney Booze, [place]

## 2022-06-12 NOTE — ED Notes (Signed)
Patient's spouse given recliner for comfort.

## 2022-06-12 NOTE — Social Work (Signed)
CSW sent out FL2. Awaiting offers

## 2022-06-12 NOTE — Social Work (Signed)
CSW sent out [employer]. CSW spoke to the Pt and his Husband they are both in agreement for the bed offer.

## 2022-06-12 NOTE — ED Notes (Signed)
Received verbal report from [place] at this time

## 2022-06-12 NOTE — ED Provider Notes (Signed)
[facility] Emergency Department Provider Note MRN:  [id number]  Arrival date & time: 06/12/22     Chief Complaint   Fall   History of Present Illness   [name] is a 76 y.o. year-old male with a history of CAD, diffuse large B-cell lymphoma presenting to the ED with chief complaint of fall.  Multiple falls today, was discharged from the emergency department after a fall and fell again this evening.  Has a bruise to his right cheek.  Takes blood thinners.  Overall has no complaints at this time, no pain, no nausea.  Explains that his legs give out sometimes.  Review of Systems  A thorough review of systems was obtained and all systems are negative except as noted in the HPI and PMH.   Patient's Health History    Past Medical History:  Diagnosis Date   Anticoagulant long-term use    xarelto--- takes for AFib,  managed by pcp, Dr Alain Marion   Benign localized prostatic hyperplasia with lower urinary tract symptoms (LUTS)    CAD (coronary artery disease) cardiologist--  dr t. Oval Linsey   hx of known CAD obstruction w/ collaterals (cath done @ [facility] 12-31-2011) ;   last cardiac cath 08-13-2019  showed sig. 2V CAD involving proxLAD and CTO of the RCA/  chronic total occlusion midRCA w/ bridging and L>R collaterals   Diastolic CHF ([place])    dx [id number] hospital admission (followed by pcp)   Diffuse large B cell lymphoma [facility]) oncologist-- dr Irene Limbo--- in remission   dx 08/ 2019 -- bx left tonsill mass-- involving lymph nodes--- completed chemotherapy 10-12-2018   Diffuse large B cell lymphoma ([place]) 05/24/2022   DJD (degenerative joint disease)    Dyspnea    Dysthymic disorder    Elevated brain natriuretic peptide (BNP) level    Environmental allergies    GAD (generalized anxiety disorder)    GERD (gastroesophageal reflux disease)    History of falling    multiple times   History of Graves' disease    1987 s/p total thyroidectomy   History of kidney  stones    History of scabies    07/ 2014  resolved   History of syncope    per D/C note in epic 02-21-2013 ?vasovagal   History of vertebral compression fracture    05/ 2014 --  L1, no surgical intervention   HLD (hyperlipidemia)    Hyperlipidemia    Hypertension    Hypogonadism in male    IDA (iron deficiency anemia)    Lumbago    OA (osteoarthritis)    OSA on CPAP    cpap machine-settings 17   Osteomyelitis of right wrist ([place]) 11/02/2012   Osteoporosis    Severe   Osteoporosis with fracture 04/24/2013   Pathological fracture due to osteoporosis with delayed healing 04/24/2013   Persistent atrial fibrillation [facility])    cardiologist--- dr Alfonso Patten. [place]   Plaque psoriasis    dermatologist--- dr Harriett Sine--- currently taking otezla   Post-surgical hypothyroidism    followed by pcp---  s/p total thyroidectomy for graves disease in 1987   Pure hypercholesterolemia 02/10/2021   Recurrent UTI 06/02/2022   Renal calculus, right    Right femoral fracture ([place]) 09/29/2012   Right forearm fracture 09/29/2012   Right tibial fracture 02/05/2013   Thrombocytopenia (HCC)    Unsteady gait    uses walker   Wears hearing aid in both ears     Past Surgical History:  Procedure Laterality Date  APPENDECTOMY  2011 approx.    CARDIAC CATHETERIZATION  12-31-2011   dr j. Beatrix Fetters '@[facility]    normal LVF w/ multivessel CAD,  occluded RCA w/ colleterals   CATARACT EXTRACTION W/ INTRAOCULAR LENS  IMPLANT, BILATERAL  2016 approx.   COLONOSCOPY     CYSTOSCOPY WITH RETROGRADE PYELOGRAM, URETEROSCOPY AND STENT PLACEMENT Left 11/07/2017   Procedure: CYSTOSCOPY WITH RETROGRADE PYELOGRAM, URETEROSCOPY AND STENT PLACEMENT;  Surgeon: Cleon Gustin, MD;  Location: [facility];  Service: Urology;  Laterality: Left;   CYSTOSCOPY WITH RETROGRADE PYELOGRAM, URETEROSCOPY AND STENT PLACEMENT Right 10/22/2019   Procedure: CYSTOSCOPY WITH RETROGRADE PYELOGRAM, URETEROSCOPY AND STENT PLACEMENT;  Surgeon: Cleon Gustin, MD;  Location: [facility];  Service: Urology;  Laterality: Right;   CYSTOSCOPY/URETEROSCOPY/HOLMIUM LASER/STENT PLACEMENT Right 03/11/2017   Procedure: CYSTOSCOPY/URETEROSCOPYSTENT PLACEMENT right ureter retrograde pylegram;  Surgeon: Cleon Gustin, MD;  [facility];  Service: Urology;  Laterality: Right;   HARDWARE REMOVAL  10/05/2012   Procedure: HARDWARE REMOVAL;  Surgeon: [name], MD;  [facility];  Service: Orthopedics;  Laterality: Right;  REMOVING  STRYKER  GAMMA NAIL   HARDWARE REMOVAL Right 07/03/2013   Procedure: HARDWARE REMOVAL RIGHT TIBIA ;  Surgeon: Rozanna Box, MD;  Location: [place];  Service: Orthopedics;  Laterality: Right;   HIP CLOSED REDUCTION Right 10/15/2013   Procedure: CLOSED MANIPULATION HIP;  Surgeon: [name], MD;  [facility];  Service: Orthopedics;  Laterality: Right;   HOLMIUM LASER APPLICATION Right 06/15/6212   Procedure: HOLMIUM LASER APPLICATION;  Surgeon: Cleon Gustin, MD;  [facility];  Service: Urology;  Laterality: Right;   HOLMIUM LASER APPLICATION Left 0/86/5784   Procedure: HOLMIUM LASER APPLICATION;  Surgeon: Cleon Gustin, MD;  [facility];  Service: Urology;  Laterality: Left;   HOLMIUM LASER APPLICATION Left 03/19/6294   Procedure: HOLMIUM LASER APPLICATION;  Surgeon: Cleon Gustin, MD;  [facility];  Service: Urology;  Laterality: Left;   HOLMIUM LASER APPLICATION Right 2/84/1324   Procedure: HOLMIUM LASER APPLICATION;  Surgeon: Cleon Gustin, MD;  Location[facility];  Service: Urology;  Laterality: Right;   INCISION AND DRAINAGE HIP Right 11/16/2013   Procedure: IRRIGATION AND DEBRIDEMENT RIGHT HIP;  Surgeon: [name], MD;  [facility];  Service: Orthopedics;  Laterality: Right;   INTRAVASCULAR PRESSURE WIRE/FFR STUDY N/A 08/13/2019   Procedure: INTRAVASCULAR PRESSURE WIRE/FFR STUDY;  Surgeon: Nelva Bush, MD;  Location: [facility];  Service: Cardiovascular;  Laterality: N/A;   IR IMAGING GUIDED PORT INSERTION  06/22/2018   IR NEPHROSTOMY PLACEMENT LEFT  03/28/2019   IR URETERAL STENT LEFT NEW ACCESS W/O SEP NEPHROSTOMY CATH  10/24/2017   IR URETERAL STENT LEFT NEW ACCESS W/O SEP NEPHROSTOMY CATH  03/26/2019   NEPHROLITHOTOMY Right 02/08/2017   Procedure: NEPHROLITHOTOMY PERCUTANEOUS WITH SURGEON ACCESS;  Surgeon: Cleon Gustin, MD;  [facility];  Service: Urology;  Laterality: Right;   NEPHROLITHOTOMY Left 10/24/2017   Procedure: NEPHROLITHOTOMY PERCUTANEOUS;  Surgeon: Cleon Gustin, MD;  [facility];  Service: Urology;  Laterality: Left;   NEPHROLITHOTOMY Left 03/26/2019   Procedure: NEPHROLITHOTOMY PERCUTANEOUS;  Surgeon: Cleon Gustin, MD;  [facility];  Service: Urology;  Laterality: Left;  2 HRS   NEPHROLITHOTOMY Left 04/17/2019   Procedure: NEPHROLITHOTOMY PERCUTANEOUS;  Surgeon: Cleon Gustin, MD;  [facility];  Service: Urology;  Laterality: Left;  2 HRS   ORIF TIBIA FRACTURE Right  02/06/2013   Procedure: OPEN REDUCTION INTERNAL FIXATION (ORIF) TIBIA FRACTURE WITH IM ROD FIBULA;  Surgeon: Rozanna Box, MD;  Location: [place];  Service: Orthopedics;  Laterality: Right;   ORIF TIBIA FRACTURE Right 07/03/2013   Procedure: RIGHT TIBIA NON UNION REPAIR ;  Surgeon: Rozanna Box, MD;  Location: [place];  Service: Orthopedics;  Laterality: Right;   ORIF WRIST FRACTURE  10/02/2012   Procedure: OPEN REDUCTION INTERNAL FIXATION (ORIF) WRIST FRACTURE;  Surgeon: Roseanne Kaufman, MD;  [facility];  Service: Orthopedics;  Laterality: Right;  WITH   ANTIBIOTIC  CEMENT   ORIF WRIST FRACTURE Left 10/28/2013   Procedure: OPEN REDUCTION INTERNAL FIXATION (ORIF) WRIST FRACTURE with allograft;  Surgeon: Roseanne Kaufman, MD;  [facility];  Service: Orthopedics;  Laterality: Left;  DVR Plate   QUADRICEPS TENDON REPAIR Left 07/15/2017   Procedure: REPAIR QUADRICEP TENDON;  Surgeon:  Frederik Pear, MD;  Location: [facility];  Service: Orthopedics;  Laterality: Left;   RIGHT/LEFT HEART CATH AND CORONARY ANGIOGRAPHY N/A 08/13/2019   Procedure: RIGHT/LEFT HEART CATH AND CORONARY ANGIOGRAPHY;  Surgeon: Nelva Bush, MD;  Location: [facility];  Service: Cardiovascular;  Laterality: N/A;   THYROIDECTOMY  02/1986   TOTAL HIP ARTHROPLASTY Right 03-16-2016   '@[facility]'$    TOTAL HIP REVISION  10/05/2012   Procedure: TOTAL HIP REVISION;  Surgeon: [name], MD;  [facility];  Service: Orthopedics;  Laterality: Right;  RIGHT TOTAL HIP REVISION   TOTAL HIP REVISION Right 09/17/2013   Procedure: REVISION RIGHT TOTAL HIP ARTHROPLASTY ;  Surgeon: [name], MD;  [facility];  Service: Orthopedics;  Laterality: Right;   TOTAL HIP REVISION Right 10/26/2013   Procedure: REVISION RIGHT TOTAL HIP ARTHROPLASTY;  Surgeon: [name], MD;  [facility];  Service: Orthopedics;  Laterality: Right;   TOTAL KNEE ARTHROPLASTY Bilateral right 03-15-2011;  left 06-30-2011   TOTAL KNEE REVISION Left 04/11/2017   Procedure: TOTAL KNEE REVISION PATELLA and TIBIA;  Surgeon: Frederik Pear, MD;  Location: [place];  Service: Orthopedics;  Laterality: Left;    Family History  Problem Relation Age of Onset   CAD Father 51   Asthma Father    Alcohol abuse Father    Arthritis Mother    Alcohol abuse Sister     Social History   Socioeconomic History   Marital status: Married    Spouse name: Not on file   Number of children: Not on file   Years of education: Not on file   Highest education level: Not on file  Occupational History   Not on file  Tobacco Use   Smoking status: Former    Packs/day: 1.00    Years: 50.00    Total pack years: 50.00    Types: Cigarettes    Quit date: 01/12/2012    Years since quitting: 10.4   Smokeless tobacco: Never  Vaping Use   Vaping Use: Never used  Substance and Sexual Activity   Alcohol use: Yes    Comment: rarely   Drug use: Never   Sexual  activity: Yes  Other Topics Concern   Not on file  Social History Narrative   [facility] 2.5 months, went home Feb 21st slipped and fell on back and developed.  Home PT/OT.  Just started outpatient physical therapy.  Friday night, misstepped.       Pt spouse verbalized pt had fallen 4 times within the last 24 hours     Social Determinants  of Health   Financial Resource Strain: Low Risk  (01/07/2022)   Overall Financial Resource Strain (CARDIA)    Difficulty of Paying Living Expenses: Not hard at all  Food Insecurity: No Food Insecurity (01/07/2022)   Hunger Vital Sign    Worried About Running Out of Food in the Last Year: Never true    Ran Out of Food in the Last Year: Never true  Transportation Needs: No Transportation Needs (01/07/2022)   PRAPARE - Hydrologist (Medical): No    Lack of Transportation (Non-Medical): No  Physical Activity: Sufficiently Active (01/07/2022)   Exercise Vital Sign    Days of Exercise per Week: 3 days    Minutes of Exercise per Session: 60 min  Stress: No Stress Concern Present (01/07/2022)   [place]    Feeling of Stress : Not at all  Social Connections: [place] (01/07/2022)   Social Connection and Isolation Panel [NHANES]    Frequency of Communication with Friends and Family: More than three times a week    Frequency of Social Gatherings with Friends and Family: More than three times a week    Attends Religious Services: 1 to 4 times per year    Active Member of [employer] or Organizations: No    Attends Music therapist: 1 to 4 times per year    Marital Status: Married  Human resources officer Violence: Not At Risk (01/07/2022)   Humiliation, Afraid, Rape, and Kick questionnaire    Fear of Current or Ex-Partner: No    Emotionally Abused: No    Physically Abused: No    Sexually Abused: No     Physical Exam   Vitals:   06/12/22 0045 06/12/22  0115  BP: 120/76 125/65  Pulse: (!) 103 89  Resp: (!) 24 (!) 29  Temp:    SpO2: 96% 99%    CONSTITUTIONAL: Well-appearing, NAD NEURO/PSYCH:  Alert and oriented x 3, normal and symmetric strength and sensation, normal coordination, normal speech EYES:  eyes equal and reactive ENT/NECK:  no LAD, no JVD CARDIO: Regular rate, well-perfused, normal S1 and S2 PULM:  CTAB no wheezing or rhonchi GI/GU:  non-distended, non-tender MSK/SPINE:  No gross deformities, no edema SKIN:  no rash, atraumatic   *Additional and/or pertinent findings included in MDM below  Diagnostic and Interventional Summary    EKG Interpretation  Date/Time:    Ventricular Rate:    PR Interval:    QRS Duration:   QT Interval:    QTC Calculation:   R Axis:     Text Interpretation:         Labs Reviewed - No data to display  CT HEAD WO CONTRAST (5MM)  Final Result    CT CERVICAL SPINE WO CONTRAST  Final Result      Medications - No data to display   Procedures  /  Critical Care Procedures  ED Course and Medical Decision Making  Initial Impression and Ddx Recurrent falls, possible head trauma, anticoagulated, will need repeat CT head.  Will need to discuss his safety with his partner and see if home versus placement is the best choice.  Past medical/surgical history that increases complexity of ED encounter: B-cell lymphoma, anticoagulated  Interpretation of Diagnostics I personally reviewed the CT head and my interpretation is as follows: No obvious bleeding    Patient Reassessment and Ultimate Disposition/Management     Patient continues to rest comfortably with normal vital  signs, reassuring work-up.  Discussed options, given the frequent falls and his anticoagulated status it might be safest to hold until the morning when PT can evaluate and TOC can offer some help at home.  Patient agreeable with this plan.  Signed out to default provider.  Patient management required discussion with  the following services or consulting groups:  None  Complexity of Problems Addressed Acute illness or injury that poses threat of life of bodily function  Additional Data Reviewed and Analyzed Further history obtained from: Recent discharge summary  Additional Factors Impacting ED Encounter Risk Consideration of hospitalization  Barth Kirks. Sedonia Small, MD [place] [email or web address]  Final Clinical Impressions(s) / ED Diagnoses     ICD-10-CM   1. Frequent falls  R29.6       ED Discharge Orders     None        Discharge Instructions Discussed with and Provided to Patient:   Discharge Instructions   None      Maudie Flakes, MD 06/12/22 0151

## 2022-06-12 NOTE — ED Notes (Signed)
Social called and made aware of PT recommendation for the pt

## 2022-06-13 DIAGNOSIS — R296 Repeated falls: Secondary | ICD-10-CM | POA: Diagnosis not present

## 2022-06-13 NOTE — Progress Notes (Signed)
CSW talk to the Pt, the Pt was accepted to [facility] so really wanted that bed offer. CSW has reached out to facility N/A left a message.

## 2022-06-13 NOTE — ED Notes (Signed)
Patient sitting up at the side of the bed and eating dinner. States he cannot remember why he is here> Reoriented patient to time, place and situation. Patient aware that he is to go to a nursing home on Monday. Patient has personal cell phone, states he needs to get in touch with spouse to make sure he is aware of situation.

## 2022-06-13 NOTE — ED Provider Notes (Signed)
Emergency Medicine Observation Re-evaluation Note  [name] is a 76 y.o. male, seen on rounds today.  Pt initially presented to the ED for complaints of Fall Currently, the patient is waiting in the ED for skilled nursing facility ability placement.  Patient states he is doing well this morning.  Is happy with plans for skilled nursing facility placement  Physical Exam  BP 128/76   Pulse 86   Temp 98 F (36.7 C) (Oral)   Resp 18   Ht 1.88 m ('6\' 2"'$ )   Wt 114.3 kg   SpO2 94%   BMI 32.35 kg/m  Physical Exam General: Alert no acute distress Lungs: Breathing easily   ED Course / MDM  EKG:   I have reviewed the labs performed to date as well as medications administered while in observation.  Recent changes in the last 24 hours include no new labs or x-rays in the last 24 hours.  Vital signs remained stable.  Plan  Current plan is for skilled nursing facility placement.    Dorie Rank, MD 06/13/22 1001

## 2022-06-13 NOTE — ED Notes (Signed)
Pt care taken, no complaints at this time. 

## 2022-06-13 NOTE — Discharge Planning (Signed)
Licensed Clinical Social Worker is seeking post-discharge placement for this patient at the following level of care: skilled nursing facility (SNF).

## 2022-06-13 NOTE — Progress Notes (Signed)
All the Pt's requirements have been approved, CSW is having a hard time reaching facility to make aware that [facility] was approved and the Pt is ready to go. CSW left voicemail and text message at [facility] as well contact number Janie at [phone]. CSW has updated Pt as well Pt Husband [name].

## 2022-06-14 DIAGNOSIS — R351 Nocturia: Secondary | ICD-10-CM | POA: Diagnosis not present

## 2022-06-14 DIAGNOSIS — W19XXXD Unspecified fall, subsequent encounter: Secondary | ICD-10-CM | POA: Diagnosis not present

## 2022-06-14 DIAGNOSIS — N400 Enlarged prostate without lower urinary tract symptoms: Secondary | ICD-10-CM | POA: Diagnosis not present

## 2022-06-14 DIAGNOSIS — W19XXXA Unspecified fall, initial encounter: Secondary | ICD-10-CM | POA: Diagnosis not present

## 2022-06-14 DIAGNOSIS — M6281 Muscle weakness (generalized): Secondary | ICD-10-CM | POA: Diagnosis not present

## 2022-06-14 DIAGNOSIS — L97829 Non-pressure chronic ulcer of other part of left lower leg with unspecified severity: Secondary | ICD-10-CM | POA: Diagnosis not present

## 2022-06-14 DIAGNOSIS — C8330 Diffuse large B-cell lymphoma, unspecified site: Secondary | ICD-10-CM | POA: Diagnosis not present

## 2022-06-14 DIAGNOSIS — C8510 Unspecified B-cell lymphoma, unspecified site: Secondary | ICD-10-CM | POA: Diagnosis not present

## 2022-06-14 DIAGNOSIS — E782 Mixed hyperlipidemia: Secondary | ICD-10-CM | POA: Diagnosis not present

## 2022-06-14 DIAGNOSIS — M109 Gout, unspecified: Secondary | ICD-10-CM | POA: Diagnosis not present

## 2022-06-14 DIAGNOSIS — E039 Hypothyroidism, unspecified: Secondary | ICD-10-CM | POA: Diagnosis not present

## 2022-06-14 DIAGNOSIS — R296 Repeated falls: Secondary | ICD-10-CM | POA: Diagnosis not present

## 2022-06-14 DIAGNOSIS — W01198A Fall on same level from slipping, tripping and stumbling with subsequent striking against other object, initial encounter: Secondary | ICD-10-CM | POA: Diagnosis not present

## 2022-06-14 DIAGNOSIS — R972 Elevated prostate specific antigen [PSA]: Secondary | ICD-10-CM | POA: Diagnosis not present

## 2022-06-14 DIAGNOSIS — I5030 Unspecified diastolic (congestive) heart failure: Secondary | ICD-10-CM | POA: Diagnosis not present

## 2022-06-14 DIAGNOSIS — Z7401 Bed confinement status: Secondary | ICD-10-CM | POA: Diagnosis not present

## 2022-06-14 DIAGNOSIS — I5032 Chronic diastolic (congestive) heart failure: Secondary | ICD-10-CM | POA: Diagnosis not present

## 2022-06-14 DIAGNOSIS — S80811A Abrasion, right lower leg, initial encounter: Secondary | ICD-10-CM | POA: Diagnosis not present

## 2022-06-14 DIAGNOSIS — I5022 Chronic systolic (congestive) heart failure: Secondary | ICD-10-CM | POA: Diagnosis not present

## 2022-06-14 DIAGNOSIS — N401 Enlarged prostate with lower urinary tract symptoms: Secondary | ICD-10-CM | POA: Diagnosis not present

## 2022-06-14 DIAGNOSIS — S060X0A Concussion without loss of consciousness, initial encounter: Secondary | ICD-10-CM | POA: Diagnosis not present

## 2022-06-14 DIAGNOSIS — Z23 Encounter for immunization: Secondary | ICD-10-CM | POA: Diagnosis not present

## 2022-06-14 DIAGNOSIS — Z79899 Other long term (current) drug therapy: Secondary | ICD-10-CM | POA: Diagnosis not present

## 2022-06-14 DIAGNOSIS — R531 Weakness: Secondary | ICD-10-CM | POA: Diagnosis not present

## 2022-06-14 DIAGNOSIS — S0101XA Laceration without foreign body of scalp, initial encounter: Secondary | ICD-10-CM | POA: Diagnosis not present

## 2022-06-14 DIAGNOSIS — N179 Acute kidney failure, unspecified: Secondary | ICD-10-CM | POA: Diagnosis not present

## 2022-06-14 DIAGNOSIS — J449 Chronic obstructive pulmonary disease, unspecified: Secondary | ICD-10-CM | POA: Diagnosis not present

## 2022-06-14 DIAGNOSIS — Z7901 Long term (current) use of anticoagulants: Secondary | ICD-10-CM | POA: Diagnosis not present

## 2022-06-14 DIAGNOSIS — N1831 Chronic kidney disease, stage 3a: Secondary | ICD-10-CM | POA: Diagnosis not present

## 2022-06-14 DIAGNOSIS — S80812A Abrasion, left lower leg, initial encounter: Secondary | ICD-10-CM | POA: Diagnosis not present

## 2022-06-14 DIAGNOSIS — R410 Disorientation, unspecified: Secondary | ICD-10-CM | POA: Diagnosis not present

## 2022-06-14 DIAGNOSIS — Z043 Encounter for examination and observation following other accident: Secondary | ICD-10-CM | POA: Diagnosis not present

## 2022-06-14 DIAGNOSIS — I110 Hypertensive heart disease with heart failure: Secondary | ICD-10-CM | POA: Diagnosis not present

## 2022-06-14 DIAGNOSIS — R41841 Cognitive communication deficit: Secondary | ICD-10-CM | POA: Diagnosis not present

## 2022-06-14 DIAGNOSIS — C8339 Diffuse large B-cell lymphoma, extranodal and solid organ sites: Secondary | ICD-10-CM | POA: Diagnosis not present

## 2022-06-14 DIAGNOSIS — F32A Depression, unspecified: Secondary | ICD-10-CM | POA: Diagnosis not present

## 2022-06-14 DIAGNOSIS — M4312 Spondylolisthesis, cervical region: Secondary | ICD-10-CM | POA: Diagnosis not present

## 2022-06-14 DIAGNOSIS — R293 Abnormal posture: Secondary | ICD-10-CM | POA: Diagnosis not present

## 2022-06-14 DIAGNOSIS — I2510 Atherosclerotic heart disease of native coronary artery without angina pectoris: Secondary | ICD-10-CM | POA: Diagnosis not present

## 2022-06-14 DIAGNOSIS — R8279 Other abnormal findings on microbiological examination of urine: Secondary | ICD-10-CM | POA: Diagnosis not present

## 2022-06-14 DIAGNOSIS — S0093XA Contusion of unspecified part of head, initial encounter: Secondary | ICD-10-CM | POA: Diagnosis not present

## 2022-06-14 DIAGNOSIS — R278 Other lack of coordination: Secondary | ICD-10-CM | POA: Diagnosis not present

## 2022-06-14 DIAGNOSIS — S161XXA Strain of muscle, fascia and tendon at neck level, initial encounter: Secondary | ICD-10-CM | POA: Diagnosis not present

## 2022-06-14 DIAGNOSIS — Y92129 Unspecified place in nursing home as the place of occurrence of the external cause: Secondary | ICD-10-CM | POA: Diagnosis not present

## 2022-06-14 DIAGNOSIS — S0990XA Unspecified injury of head, initial encounter: Secondary | ICD-10-CM | POA: Diagnosis present

## 2022-06-14 DIAGNOSIS — R9431 Abnormal electrocardiogram [ECG] [EKG]: Secondary | ICD-10-CM | POA: Diagnosis not present

## 2022-06-14 DIAGNOSIS — I4891 Unspecified atrial fibrillation: Secondary | ICD-10-CM | POA: Diagnosis not present

## 2022-06-14 DIAGNOSIS — I10 Essential (primary) hypertension: Secondary | ICD-10-CM | POA: Diagnosis not present

## 2022-06-14 DIAGNOSIS — R2681 Unsteadiness on feet: Secondary | ICD-10-CM | POA: Diagnosis not present

## 2022-06-14 DIAGNOSIS — I6523 Occlusion and stenosis of bilateral carotid arteries: Secondary | ICD-10-CM | POA: Diagnosis not present

## 2022-06-14 DIAGNOSIS — R0682 Tachypnea, not elsewhere classified: Secondary | ICD-10-CM | POA: Diagnosis not present

## 2022-06-14 DIAGNOSIS — N3941 Urge incontinence: Secondary | ICD-10-CM | POA: Diagnosis not present

## 2022-06-14 NOTE — ED Notes (Signed)
PTAR arrived for transport to [facility]

## 2022-06-14 NOTE — Discharge Instructions (Signed)
Follow up with your primary care doctor for further evaluation.

## 2022-06-14 NOTE — Progress Notes (Signed)
Patient is discharging to [facility] rehab today. CSW sent AVS to facility and is waiting for room number and number for report.

## 2022-06-14 NOTE — Progress Notes (Signed)
Transition of Care [facility]) - CAGE-AID Screening   Patient Details  Name: [name] MRN: [id number] Date of Birth: 12-Feb-1946   Elvina Sidle, RN Trauma Response Nurse Phone Number: [phone] 06/14/2022, 9:42 AM   Clinical Narrative:  Pt is currently waiting SNF placement  CAGE-AID Screening:    Have You Ever Felt You Ought to Cut Down on Your Drinking or Drug Use?: No Have People Annoyed You By Critizing Your Drinking Or Drug Use?: No Have You Felt Bad Or Guilty About Your Drinking Or Drug Use?: No Have You Ever Had a Drink or Used Drugs First Thing In The Morning to Steady Your Nerves or to Get Rid of a Hangover?: No CAGE-AID Score: 0  Substance Abuse Education Offered: No

## 2022-06-14 NOTE — Progress Notes (Signed)
CSW contacted patients husband Ruggles,Michael [phone] to inform him of the plan today. Mr. Marcille Buffy stated he is going to [facility] at 12:30pm today to sign the paperwork. CSW stated that PTAR will be called and to get his husband over to [facility] today once he signs the paperwork.

## 2022-06-14 NOTE — Progress Notes (Signed)
Patient is going to room 206 at [facility]. Number for report is [phone]. Information provided to patients nurse.

## 2022-06-14 NOTE — ED Provider Notes (Signed)
Emergency Medicine Observation Re-evaluation Note  [name] is a 76 y.o. male, seen on rounds today.  Pt initially presented to the ED for complaints of Fall Currently, the patient is waiting for snf placement.  Physical Exam  BP 137/62 (BP Location: Right Arm)   Pulse 74   Temp 97.8 F (36.6 C) (Oral)   Resp 20   Ht 1.88 m ('6\' 2"'$ )   Wt 114.3 kg   SpO2 94%   BMI 32.35 kg/m  Physical Exam General: alert, eating breakfast, NAD  ED Course / MDM  EKG:   I have reviewed the labs performed to date as well as medications administered while in observation.  Recent changes in the last 24 hours include no changes since I saw him yesterday.  Plan  Current plan is for SNF placement.    Dorie Rank, MD 06/14/22 [phone]

## 2022-06-14 NOTE — ED Notes (Signed)
PT sitting up eating meal.

## 2022-06-15 DIAGNOSIS — I4891 Unspecified atrial fibrillation: Secondary | ICD-10-CM | POA: Diagnosis not present

## 2022-06-15 DIAGNOSIS — I5032 Chronic diastolic (congestive) heart failure: Secondary | ICD-10-CM | POA: Diagnosis not present

## 2022-06-15 DIAGNOSIS — M109 Gout, unspecified: Secondary | ICD-10-CM | POA: Diagnosis not present

## 2022-06-15 DIAGNOSIS — C8510 Unspecified B-cell lymphoma, unspecified site: Secondary | ICD-10-CM | POA: Diagnosis not present

## 2022-06-15 DIAGNOSIS — E039 Hypothyroidism, unspecified: Secondary | ICD-10-CM | POA: Diagnosis not present

## 2022-06-15 DIAGNOSIS — E782 Mixed hyperlipidemia: Secondary | ICD-10-CM | POA: Diagnosis not present

## 2022-06-15 DIAGNOSIS — J449 Chronic obstructive pulmonary disease, unspecified: Secondary | ICD-10-CM | POA: Diagnosis not present

## 2022-06-15 DIAGNOSIS — F32A Depression, unspecified: Secondary | ICD-10-CM | POA: Diagnosis not present

## 2022-06-15 DIAGNOSIS — I10 Essential (primary) hypertension: Secondary | ICD-10-CM | POA: Diagnosis not present

## 2022-06-16 ENCOUNTER — Telehealth: Payer: Self-pay | Admitting: Internal Medicine

## 2022-06-16 DIAGNOSIS — C8510 Unspecified B-cell lymphoma, unspecified site: Secondary | ICD-10-CM | POA: Diagnosis not present

## 2022-06-16 DIAGNOSIS — I4891 Unspecified atrial fibrillation: Secondary | ICD-10-CM | POA: Diagnosis not present

## 2022-06-16 DIAGNOSIS — N179 Acute kidney failure, unspecified: Secondary | ICD-10-CM | POA: Diagnosis not present

## 2022-06-16 DIAGNOSIS — W19XXXA Unspecified fall, initial encounter: Secondary | ICD-10-CM | POA: Diagnosis not present

## 2022-06-16 NOTE — Telephone Encounter (Signed)
Check [place] registry last filled 06/15/2022 14 day supply. Med was filled by Dr. Seward Carol. Will forward to PCP for renewal../lmb

## 2022-06-16 NOTE — Telephone Encounter (Signed)
Caller & Relationship to patient: [name] - spouse  Call back number: [phone]  Date of last office visit: 05-20-22  Date of next office visit: 08-23-22  Medication(s) to be refilled: clonazePAM Bobbye [place]) 1 MG tablet  Preferred Pharmacy: [facility], [place]

## 2022-06-17 ENCOUNTER — Emergency Department (HOSPITAL_COMMUNITY)
Admission: EM | Admit: 2022-06-17 | Discharge: 2022-06-17 | Disposition: A | Discharge status: Skilled Nursing Facility | Payer: Medicare PPO | Attending: Emergency Medicine | Admitting: Emergency Medicine

## 2022-06-17 ENCOUNTER — Emergency Department (HOSPITAL_COMMUNITY): Payer: Medicare PPO

## 2022-06-17 DIAGNOSIS — R9431 Abnormal electrocardiogram [ECG] [EKG]: Secondary | ICD-10-CM | POA: Diagnosis not present

## 2022-06-17 DIAGNOSIS — I5030 Unspecified diastolic (congestive) heart failure: Secondary | ICD-10-CM | POA: Insufficient documentation

## 2022-06-17 DIAGNOSIS — I2510 Atherosclerotic heart disease of native coronary artery without angina pectoris: Secondary | ICD-10-CM | POA: Insufficient documentation

## 2022-06-17 DIAGNOSIS — I10 Essential (primary) hypertension: Secondary | ICD-10-CM | POA: Diagnosis not present

## 2022-06-17 DIAGNOSIS — Y92129 Unspecified place in nursing home as the place of occurrence of the external cause: Secondary | ICD-10-CM | POA: Diagnosis not present

## 2022-06-17 DIAGNOSIS — S80811A Abrasion, right lower leg, initial encounter: Secondary | ICD-10-CM | POA: Diagnosis not present

## 2022-06-17 DIAGNOSIS — Z79899 Other long term (current) drug therapy: Secondary | ICD-10-CM | POA: Insufficient documentation

## 2022-06-17 DIAGNOSIS — E039 Hypothyroidism, unspecified: Secondary | ICD-10-CM | POA: Diagnosis not present

## 2022-06-17 DIAGNOSIS — W19XXXD Unspecified fall, subsequent encounter: Secondary | ICD-10-CM | POA: Diagnosis not present

## 2022-06-17 DIAGNOSIS — I5032 Chronic diastolic (congestive) heart failure: Secondary | ICD-10-CM | POA: Diagnosis not present

## 2022-06-17 DIAGNOSIS — W01198A Fall on same level from slipping, tripping and stumbling with subsequent striking against other object, initial encounter: Secondary | ICD-10-CM | POA: Insufficient documentation

## 2022-06-17 DIAGNOSIS — I4891 Unspecified atrial fibrillation: Secondary | ICD-10-CM | POA: Insufficient documentation

## 2022-06-17 DIAGNOSIS — N1831 Chronic kidney disease, stage 3a: Secondary | ICD-10-CM | POA: Diagnosis not present

## 2022-06-17 DIAGNOSIS — S060X0A Concussion without loss of consciousness, initial encounter: Secondary | ICD-10-CM | POA: Diagnosis not present

## 2022-06-17 DIAGNOSIS — E782 Mixed hyperlipidemia: Secondary | ICD-10-CM | POA: Diagnosis not present

## 2022-06-17 DIAGNOSIS — J449 Chronic obstructive pulmonary disease, unspecified: Secondary | ICD-10-CM | POA: Diagnosis not present

## 2022-06-17 DIAGNOSIS — Z043 Encounter for examination and observation following other accident: Secondary | ICD-10-CM | POA: Diagnosis not present

## 2022-06-17 DIAGNOSIS — S0990XA Unspecified injury of head, initial encounter: Secondary | ICD-10-CM | POA: Diagnosis not present

## 2022-06-17 DIAGNOSIS — S0101XA Laceration without foreign body of scalp, initial encounter: Secondary | ICD-10-CM | POA: Diagnosis not present

## 2022-06-17 DIAGNOSIS — S80812A Abrasion, left lower leg, initial encounter: Secondary | ICD-10-CM | POA: Diagnosis not present

## 2022-06-17 DIAGNOSIS — I110 Hypertensive heart disease with heart failure: Secondary | ICD-10-CM | POA: Diagnosis not present

## 2022-06-17 DIAGNOSIS — Z7901 Long term (current) use of anticoagulants: Secondary | ICD-10-CM | POA: Insufficient documentation

## 2022-06-17 DIAGNOSIS — Z23 Encounter for immunization: Secondary | ICD-10-CM | POA: Diagnosis not present

## 2022-06-17 DIAGNOSIS — I6523 Occlusion and stenosis of bilateral carotid arteries: Secondary | ICD-10-CM | POA: Diagnosis not present

## 2022-06-17 DIAGNOSIS — S161XXA Strain of muscle, fascia and tendon at neck level, initial encounter: Secondary | ICD-10-CM | POA: Diagnosis not present

## 2022-06-17 DIAGNOSIS — M4312 Spondylolisthesis, cervical region: Secondary | ICD-10-CM | POA: Diagnosis not present

## 2022-06-17 DIAGNOSIS — L97829 Non-pressure chronic ulcer of other part of left lower leg with unspecified severity: Secondary | ICD-10-CM | POA: Diagnosis not present

## 2022-06-17 LAB — COMPREHENSIVE METABOLIC PANEL
ALT: 25 U/L (ref 0–44)
AST: 33 U/L (ref 15–41)
Albumin: 3.1 g/dL — ABNORMAL LOW (ref 3.5–5.0)
Alkaline Phosphatase: 74 U/L (ref 38–126)
Anion gap: 14 (ref 5–15)
BUN: 25 mg/dL — ABNORMAL HIGH (ref 8–23)
CO2: 24 mmol/L (ref 22–32)
Calcium: 9.3 mg/dL (ref 8.9–10.3)
Chloride: 98 mmol/L (ref 98–111)
Creatinine, Ser: 1.44 mg/dL — ABNORMAL HIGH (ref 0.61–1.24)
GFR, Estimated: 50 mL/min — ABNORMAL LOW (ref >60)
Glucose, Bld: 102 mg/dL — ABNORMAL HIGH (ref 70–99)
Potassium: 3.4 mmol/L — ABNORMAL LOW (ref 3.5–5.1)
Sodium: 136 mmol/L (ref 135–145)
Total Bilirubin: 1.9 mg/dL — ABNORMAL HIGH (ref 0.3–1.2)
Total Protein: 5.8 g/dL — ABNORMAL LOW (ref 6.5–8.1)

## 2022-06-17 LAB — ETHANOL: Alcohol, Ethyl (B): <10 mg/dL (ref <10)

## 2022-06-17 LAB — CBC
HCT: 39 % (ref 39.0–52.0)
Hemoglobin: 13 g/dL (ref 13.0–17.0)
MCH: 34.9 pg — ABNORMAL HIGH (ref 26.0–34.0)
MCHC: 33.3 g/dL (ref 30.0–36.0)
MCV: 104.8 fL — ABNORMAL HIGH (ref 80.0–100.0)
Platelets: 167 10*3/uL (ref 150–400)
RBC: 3.72 MIL/uL — ABNORMAL LOW (ref 4.22–5.81)
RDW: 16.9 % — ABNORMAL HIGH (ref 11.5–15.5)
WBC: 9.6 10*3/uL (ref 4.0–10.5)
nRBC: 0 % (ref 0.0–0.2)

## 2022-06-17 LAB — PROTIME-INR
INR: 1.1 (ref 0.8–1.2)
Prothrombin Time: 14.1 seconds (ref 11.4–15.2)

## 2022-06-17 LAB — CBG MONITORING, ED: Glucose-Capillary: 82 mg/dL (ref 70–99)

## 2022-06-17 MED ORDER — TETANUS-DIPHTH-ACELL PERTUSSIS 5-2.5-18.5 LF-MCG/0.5 IM SUSY
0.5000 mL | PREFILLED_SYRINGE | Freq: Once | INTRAMUSCULAR | Status: AC
Start: 2022-06-17 — End: 2022-06-17
  Administered 2022-06-17: 0.5 mL via INTRAMUSCULAR
  Filled 2022-06-17: qty 0.5

## 2022-06-17 NOTE — ED Notes (Signed)
Pt's wounds cleaned and irrigated.

## 2022-06-17 NOTE — ED Notes (Signed)
Called [facility] and updated Andreas Blower that the pt is up for DC and will be returning.

## 2022-06-17 NOTE — ED Provider Notes (Signed)
[facility] EMERGENCY DEPARTMENT Provider Note   CSN: [id number] Arrival date & time: 06/17/22  0022     History  Chief Complaint  Patient presents with   Fall on Blood Thinners]   [place] 2 [place] is a 76 y.o. male.  The history is provided by the patient and the EMS personnel.  Patient with multiple medical conditions including A-fib on anticoagulation, CAD presents after a fall. Patient resides in a local nursing facility and had mechanical fall.  Patient reports hitting his head on the floor, no LOC is reported.  Patient did not tolerate c-collar.  Also reports of confusion, patient recently treated for UTI.  He is currently on amoxicillin.  He takes Xarelto for anticoagulation Patient reports recent falls prior to the night and has abrasions on his lower extremities, but no other acute complaints    Past Medical History:  Diagnosis Date   Anticoagulant long-term use    xarelto--- takes for AFib,  managed by pcp, Dr Alain Marion   Benign localized prostatic hyperplasia with lower urinary tract symptoms (LUTS)    CAD (coronary artery disease) cardiologist--  dr t. Oval Linsey   hx of known CAD obstruction w/ collaterals (cath done @ [facility] 12-31-2011) ;   last cardiac cath 08-13-2019  showed sig. 2V CAD involving proxLAD and CTO of the RCA/  chronic total occlusion midRCA w/ bridging and L>R collaterals   Diastolic CHF ([place])    dx [phone] hospital admission (followed by pcp)   Diffuse large B cell lymphoma [facility]) oncologist-- dr Irene Limbo--- in remission   dx 08/ 2019 -- bx left tonsill mass-- involving lymph nodes--- completed chemotherapy 10-12-2018   Diffuse large B cell lymphoma ([place]) 05/24/2022   DJD (degenerative joint disease)    Dyspnea    Dysthymic disorder    Elevated brain natriuretic peptide (BNP) level    Environmental allergies    GAD (generalized anxiety disorder)    GERD (gastroesophageal reflux disease)    History of falling     multiple times   History of Graves' disease    1987 s/p total thyroidectomy   History of kidney stones    History of scabies    07/ 2014  resolved   History of syncope    per D/C note in epic 02-21-2013 ?vasovagal   History of vertebral compression fracture    05/ 2014 --  L1, no surgical intervention   HLD (hyperlipidemia)    Hyperlipidemia    Hypertension    Hypogonadism in male    IDA (iron deficiency anemia)    Lumbago    OA (osteoarthritis)    OSA on CPAP    cpap machine-settings 17   Osteomyelitis of right wrist ([place]) 11/02/2012   Osteoporosis    Severe   Osteoporosis with fracture 04/24/2013   Pathological fracture due to osteoporosis with delayed healing 04/24/2013   Persistent atrial fibrillation [facility])    cardiologist--- dr Alfonso Patten. [place]   Plaque psoriasis    dermatologist--- dr Harriett Sine--- currently taking otezla   Post-surgical hypothyroidism    followed by pcp---  s/p total thyroidectomy for graves disease in 1987   Pure hypercholesterolemia 02/10/2021   Recurrent UTI 06/02/2022   Renal calculus, right    Right femoral fracture ([place]) 09/29/2012   Right forearm fracture 09/29/2012   Right tibial fracture 02/05/2013   Thrombocytopenia ([facility])    Unsteady gait    uses walker   Wears hearing aid in both ears  Home Medications Prior to Admission medications   Medication Sig Start Date End Date Taking? Authorizing Provider  acetaminophen (TYLENOL) 325 MG tablet Take 2 tablets (650 mg total) by mouth every 6 (six) hours as needed for mild pain (or Fever >/= 101). 03/25/22   Raiford Noble Latif, DO  albuterol (VENTOLIN HFA) 108 (90 Base) MCG/ACT inhaler Inhale 2 puffs into the lungs every 6 (six) hours as needed for wheezing or shortness of breath.    [provider]  alfuzosin (UROXATRAL) 10 MG 24 hr tablet TAKE 1 TABLET AT BEDTIME Patient not taking: Reported on 06/11/2022 03/19/21   Cleon Gustin, MD  allopurinol (ZYLOPRIM) 100 MG tablet Take 1  tablet (100 mg total) by mouth 2 (two) times daily. Patient not taking: Reported on 05/20/2022 03/19/22   Brunetta Genera, MD  amoxicillin-clavulanate (AUGMENTIN) 875-125 MG tablet Take 1 tablet by mouth 2 (two) times daily. Patient not taking: Reported on 06/11/2022 05/20/22   Plotnikov, Evie Lacks, MD  atorvastatin (LIPITOR) 80 MG tablet Take 80 mg by mouth at bedtime.    [provider]  buPROPion (WELLBUTRIN XL) 300 MG 24 hr tablet TAKE 1 TABLET EVERY DAY WITH BREAKFAST Patient taking differently: Take 300 mg by mouth daily with breakfast. 02/20/21   Plotnikov, Evie Lacks, MD  Calcium Citrate-Vitamin D (CALCIUM CITRATE + D PO) Take 1 tablet by mouth 2 (two) times daily. '1200mg'$  of Calcium and 1000 units vitamin D3    [provider]  clonazePAM (KLONOPIN) 1 MG tablet Take 1 tablet (1 mg total) by mouth every 6 (six) hours as needed for anxiety. Take 1 tablet (1 mg total) by mouth every 6 (six) hours as needed. for anxiety Strength: 1 mg Patient taking differently: Take 1 mg by mouth See admin instructions. Take one tablet (1 mg) by mouth daily with lunch, may also take one tablet (1 mg) at bedtime as needed for sleep 03/26/22   Raiford Noble Latif, DO  CRANBERRY PO Take 2 tablets by mouth every morning.    [provider]  denosumab (PROLIA) 60 MG/ML SOSY injection Inject 60 mg as directed every 6 (six) months. 12/10/15   [provider]  dexamethasone (DECADRON) 4 MG tablet Take 5 tablets (20 mg total) by mouth daily. Start the day after carboplatin chemotherapy for 3 days. Patient not taking: Reported on 06/02/2022 03/19/22   Brunetta Genera, MD  diltiazem (CARDIZEM CD) 180 MG 24 hr capsule Take 1 capsule by mouth daily. Patient not taking: Reported on 06/11/2022 05/17/22   Loel Dubonnet, NP  Docusate Sodium 100 MG capsule Take 100 mg by mouth 2 (two) times daily.    [provider]  DULoxetine (CYMBALTA) 60 MG capsule TAKE 1 CAPSULE TWICE DAILY Patient  taking differently: Take 60 mg by mouth 2 (two) times daily. 03/23/21   Plotnikov, Evie Lacks, MD  EPINEPHrine 0.3 mg/0.3 mL IJ SOAJ injection Inject 0.3 mg into the muscle once as needed for anaphylaxis (severe allergic reaction). Patient not taking: Reported on 06/02/2022 07/31/21   [provider]  ezetimibe (ZETIA) 10 MG tablet TAKE 1 TABLET EVERY DAY Patient not taking: Reported on 06/11/2022 08/10/21   Skeet Latch, MD  feeding supplement (ENSURE ENLIVE / ENSURE PLUS) LIQD Take 237 mLs by mouth 2 (two) times daily between meals. Patient taking differently: Take 237 mLs by mouth daily. 03/25/22   Raiford Noble Latif, DO  HYDROcodone-acetaminophen (NORCO) 7.5-325 MG tablet Take 1 tablet by mouth every 6 (six) hours  as needed for severe pain. 03/26/22   Raiford Noble Latif, DO  levothyroxine (SYNTHROID) 100 MCG tablet Take 1 tablet (100 mcg total) by mouth daily before breakfast. 05/24/22   Plotnikov, Evie Lacks, MD  lidocaine-prilocaine (EMLA) cream Apply 1 Application topically as needed. Patient taking differently: Apply 1 Application topically as needed (For port access/chemotherapy). 04/21/22   Brunetta Genera, MD  loratadine (CLARITIN) 10 MG tablet Take 10 mg by mouth every morning.    [provider]  Magnesium 500 MG CAPS Take 1 capsule (500 mg total) by mouth daily. Patient taking differently: Take 500 mg by mouth daily with supper. 10/13/18   Brunetta Genera, MD  Menthol, Topical Analgesic, (BIOFREEZE) 10 % CREA Apply 1 Application topically daily as needed (Knee pain).    [provider]  metoprolol tartrate (LOPRESSOR) 25 MG tablet Take 1 tablet (25 mg total) by mouth 2 (two) times daily. Patient not taking: Reported on 06/11/2022 05/17/22   Loel Dubonnet, NP  Multiple Vitamin (MULTIVITAMIN WITH MINERALS) TABS tablet Take 1 tablet by mouth every morning. Men's One-A-Day 50+    [provider]  MYRBETRIQ 25 MG TB24 tablet TAKE 1 TABLET EVERY  DAY Patient taking differently: Take 25 mg by mouth at bedtime. 10/06/21   McKenzie, Candee Furbish, MD  nitroGLYCERIN (NITROSTAT) 0.4 MG SL tablet Place 1 tablet (0.4 mg total) under the tongue every 5 (five) minutes as needed for chest pain (max 3 doses). Patient not taking: Reported on 06/02/2022 05/17/22   Loel Dubonnet, NP  omeprazole (PRILOSEC) 40 MG capsule TAKE 1 CAPSULE EVERY DAY BEFORE BREAKFAST Patient not taking: Reported on 06/11/2022 06/18/21   Plotnikov, Evie Lacks, MD  ondansetron (ZOFRAN) 8 MG tablet Take 1 tablet (8 mg total) by mouth 2 (two) times daily as needed. Start on the 3rd day after cisplatin chemotherapy. Patient not taking: Reported on 06/02/2022 03/19/22   Brunetta Genera, MD  Potassium Citrate 15 MEQ (1620 MG) TBCR Take 1 tablet by mouth See admin instructions. Take one tablet (15 meq) by mouth twice daily - with lunch and supper Patient taking differently: Take 1 tablet by mouth in the morning and at bedtime. 06/10/22   McKenzie, Candee Furbish, MD  Probiotic Product (PROBIOTIC PO) Take 1 capsule by mouth daily with breakfast.    [provider]  prochlorperazine (COMPAZINE) 10 MG tablet Take 1 tablet (10 mg total) by mouth every 6 (six) hours as needed (Nausea or vomiting). Patient not taking: Reported on 06/11/2022 03/19/22   Brunetta Genera, MD  rivaroxaban (XARELTO) 20 MG TABS tablet Take 1 tablet (20 mg total) by mouth daily with supper. 05/26/22   Loel Dubonnet, NP  testosterone enanthate (DELATESTRYL) 200 MG/ML injection INJECT 1ML ('200MG'$ ) INTO THE MUSCLE EVERY 14 DAYS. DISCARD VIAL AFTER 28 DAYS. Patient taking differently: Inject 200 mg into the muscle every 14 (fourteen) days. Every other Friday 09/22/21   Plotnikov, Evie Lacks, MD  vitamin C (ASCORBIC ACID) 500 MG tablet Take 500 mg by mouth daily with lunch.    [provider]      Allergies    Short ragweed pollen ext    Review of Systems   Review of Systems  Constitutional:  Negative for  fever.    Physical Exam Updated Vital Signs BP 118/88   Pulse 78   Temp 98.4 F (36.9 C) (Oral)   Resp 18   SpO2 90%  Physical Exam CONSTITUTIONAL: Elderly, no acute distress HEAD: Last  ration to posterior scalp, no active bleeding EYES: EOMI/PERRL ENMT: Mucous membranes moist, bruising noted to face that appears old NECK: supple no meningeal signs SPINE/BACK:entire spine nontender No bruising/crepitance/stepoffs noted to spine CV: Irregular LUNGS: Lungs are clear to auscultation bilaterally, no apparent distress Chest-no bruising or crepitus ABDOMEN: soft, nontender no tenderness GU:no cva tenderness NEURO: Pt is awake/alert moves all extremitiesx4.  No facial droop.   EXTREMITIES: pulses normal/equal, full ROM, scattered bandages noted lower extremities, but no signs of acute trauma Pelvis stable All other extremities/joints palpated/ranged and nontender SKIN: warm, color normal PSYCH: Mildly anxious  ED Results / Procedures / Treatments   Labs (all labs ordered are listed, but only abnormal results are displayed) Labs Reviewed  COMPREHENSIVE METABOLIC PANEL - Abnormal; Notable for the following components:      Result Value   Potassium 3.4 (*)    Glucose, Bld 102 (*)    BUN 25 (*)    Creatinine, Ser 1.44 (*)    Total Protein 5.8 (*)    Albumin 3.1 (*)    Total Bilirubin 1.9 (*)    GFR, Estimated 50 (*)    All other components within normal limits  CBC - Abnormal; Notable for the following components:   RBC 3.72 (*)    MCV 104.8 (*)    MCH 34.9 (*)    RDW 16.9 (*)    All other components within normal limits  ETHANOL  PROTIME-INR  CBG MONITORING, ED    EKG EKG Interpretation  Date/Time:  Thursday June 17 2022 00:35:17 EDT Ventricular Rate:  97 PR Interval:    QRS Duration: 108 QT Interval:  389 QTC Calculation: 495 R Axis:   -59 Text Interpretation: Atrial fibrillation LAD, consider left anterior fascicular block Anterior infarct, old No  significant change since last tracing Confirmed by Ripley Fraise [phone]) on 06/17/2022 12:58:18 AM  Radiology CT HEAD WO CONTRAST  Result Date: 06/17/2022 CLINICAL DATA:  Head trauma, moderate-severe; Polytrauma, blunt EXAM: CT HEAD WITHOUT CONTRAST CT CERVICAL SPINE WITHOUT CONTRAST TECHNIQUE: Multidetector CT imaging of the head and cervical spine was performed following the standard protocol without intravenous contrast. Multiplanar CT image reconstructions of the cervical spine were also generated. RADIATION DOSE REDUCTION: This exam was performed according to the departmental dose-optimization program which includes automated exposure control, adjustment of the mA and/or kV according to patient size and/or use of iterative reconstruction technique. COMPARISON:  CT head and C-spine 06/12/2022 FINDINGS: CT HEAD FINDINGS BRAIN: BRAIN Cerebral ventricle sizes are concordant with the degree of cerebral volume loss. Patchy and confluent areas of decreased attenuation are noted throughout the deep and periventricular white matter of the cerebral hemispheres bilaterally, compatible with chronic microvascular ischemic disease. No evidence of large-territorial acute infarction. No parenchymal hemorrhage. No mass lesion. No extra-axial collection. No mass effect or midline shift. No hydrocephalus. Basilar cisterns are patent. Vascular: No hyperdense vessel. Atherosclerotic calcifications are present within the cavernous internal carotid arteries. Skull: No acute fracture or focal lesion. Sinuses/Orbits: Paranasal sinuses and mastoid air cells are clear. Bilateral lens replacement. Otherwise the orbits are unremarkable. Other: No large hematoma formation. CT CERVICAL SPINE FINDINGS Alignment: Grade 1 anterolisthesis of C4 on C5. Skull base and vertebrae: Multilevel moderate degenerative changes spine most prominent at the C5 through C7 levels. No associated severe osseous neural foraminal or central canal stenosis. No  acute fracture. No aggressive appearing focal osseous lesion or focal pathologic process. Soft tissues and spinal canal: No prevertebral fluid or swelling. No visible canal  hematoma. Upper chest: Paraseptal and centrilobular emphysematous changes. Other: Partially visualized right central venous catheter. IMPRESSION: 1. No acute intracranial abnormality. 2. No acute displaced fracture or traumatic listhesis of the cervical spine. 3.  Emphysema (ICD10-J43.9). Electronically Signed   By: Iven Finn M.D.   On: 06/17/2022 01:04   CT CERVICAL SPINE WO CONTRAST  Result Date: 06/17/2022 CLINICAL DATA:  Head trauma, moderate-severe; Polytrauma, blunt EXAM: CT HEAD WITHOUT CONTRAST CT CERVICAL SPINE WITHOUT CONTRAST TECHNIQUE: Multidetector CT imaging of the head and cervical spine was performed following the standard protocol without intravenous contrast. Multiplanar CT image reconstructions of the cervical spine were also generated. RADIATION DOSE REDUCTION: This exam was performed according to the departmental dose-optimization program which includes automated exposure control, adjustment of the mA and/or kV according to patient size and/or use of iterative reconstruction technique. COMPARISON:  CT head and C-spine 06/12/2022 FINDINGS: CT HEAD FINDINGS BRAIN: BRAIN Cerebral ventricle sizes are concordant with the degree of cerebral volume loss. Patchy and confluent areas of decreased attenuation are noted throughout the deep and periventricular white matter of the cerebral hemispheres bilaterally, compatible with chronic microvascular ischemic disease. No evidence of large-territorial acute infarction. No parenchymal hemorrhage. No mass lesion. No extra-axial collection. No mass effect or midline shift. No hydrocephalus. Basilar cisterns are patent. Vascular: No hyperdense vessel. Atherosclerotic calcifications are present within the cavernous internal carotid arteries. Skull: No acute fracture or focal lesion.  Sinuses/Orbits: Paranasal sinuses and mastoid air cells are clear. Bilateral lens replacement. Otherwise the orbits are unremarkable. Other: No large hematoma formation. CT CERVICAL SPINE FINDINGS Alignment: Grade 1 anterolisthesis of C4 on C5. Skull base and vertebrae: Multilevel moderate degenerative changes spine most prominent at the C5 through C7 levels. No associated severe osseous neural foraminal or central canal stenosis. No acute fracture. No aggressive appearing focal osseous lesion or focal pathologic process. Soft tissues and spinal canal: No prevertebral fluid or swelling. No visible canal hematoma. Upper chest: Paraseptal and centrilobular emphysematous changes. Other: Partially visualized right central venous catheter. IMPRESSION: 1. No acute intracranial abnormality. 2. No acute displaced fracture or traumatic listhesis of the cervical spine. 3.  Emphysema (ICD10-J43.9). Electronically Signed   By: Iven Finn M.D.   On: 06/17/2022 01:04   DG Chest Port 1 View  Result Date: 06/17/2022 CLINICAL DATA:  Fall. EXAM: PORTABLE CHEST 1 VIEW COMPARISON:  Chest radiograph dated 04/22/2022 and CT dated 04/22/2022. FINDINGS: Right-sided Port-A-Cath with tip over central SVC. No focal consolidation, pleural effusion or pneumothorax. Mild cardiomegaly. No acute osseous pathology. IMPRESSION: 1. No acute cardiopulmonary process. 2. Mild cardiomegaly. Electronically Signed   By: Anner Crete M.D.   On: 06/17/2022 00:55    Procedures .Marland KitchenLaceration Repair  Date/Time: 06/17/2022 1:50 AM  Performed by: Ripley Fraise, MD Authorized by: Ripley Fraise, MD   Consent:    Consent obtained:  Verbal   Consent given by:  Patient Anesthesia:    Anesthesia method:  None Laceration details:    Location:  Scalp   Scalp location:  Occipital   Length (cm):  3 Treatment:    Amount of cleaning:  Standard Skin repair:    Repair method:  Staples   Number of staples:  3 Approximation:     Approximation:  Close Repair type:    Repair type:  Simple Post-procedure details:    Procedure completion:  Tolerated well, no immediate complications     Medications Ordered in ED Medications  Tdap (BOOSTRIX) injection 0.5 mL (0.5 mLs Intramuscular Given 06/17/22 0038)  ED Course/ Medical Decision Making/ A&P Clinical Course as of 06/17/22 0230  Thu Jun 17, 2022  0100 Prehospital EKG reveals A-fib with a rate of 86.  Patient will continue with a level 2 trauma work-up pending at this time [DW]  0229 Overall patient has been stable.  There is been mild hypoxia when he sleeps but is easily corrects.  No signs of any acute traumatic injuries.  Labs overall are at baseline [DW]  0229 Patient reports has had increasing episodes of confusion recently, but this is not a new phenomenon tonight.  He is able to recall them in detail.  He is currently being treated for UTI per his MAR from the facility.  This could be playing a role.  He is also on multiple medications that could contribute to this including benzodiazepines and pain medications.  Patient feels comfortable discharge back to facility as he just was in the ER recently. [DW]  0230 At this point no signs of delirium or other acute neurologic or traumatic issue [DW]    Clinical Course User Index [DW] Ripley Fraise, MD                           Medical Decision Making Amount and/or Complexity of Data Reviewed Labs: ordered. Radiology: ordered.  Risk Prescription drug management.   This patient presents to the ED for concern of fall with head injury on anticoagulation, this involves an extensive number of treatment options, and is a complaint that carries with it a high risk of complications and morbidity.  The differential diagnosis includes but is not limited to intracranial hemorrhage, subdural hematoma, subarachnoid hemorrhage, skull fracture, concussion  Comorbidities that complicate the patient evaluation: Patient's  presentation is complicated by their history of atrial fibrillation on anticoagulation, CAD  Social Determinants of Health: Patient's  poor mobility   increases the complexity of managing their presentation  Additional history obtained: Additional history obtained from EMS discussed with paramedic at bedside Records reviewed previous admission documents  Lab Tests: I Ordered, and personally interpreted labs.  The pertinent results include: Renal insufficiency  Imaging Studies ordered: I ordered imaging studies including CT scan head and C-spine and X-ray chest   I independently visualized and interpreted imaging which showed no acute findings I agree with the radiologist interpretation  Cardiac Monitoring: The patient was maintained on a cardiac monitor.  I personally viewed and interpreted the cardiac monitor which showed an underlying rhythm of:  Atrial Fibrillation   Reevaluation: After the interventions noted above, I reevaluated the patient and found that they have :improved  Complexity of problems addressed: Patient's presentation is most consistent with  acute presentation with potential threat to life or bodily function  Disposition: After consideration of the diagnostic results and the patient's response to treatment,  I feel that the patent would benefit from discharge   .           Final Clinical Impression(s) / ED Diagnoses Final diagnoses:  Concussion without loss of consciousness, initial encounter  Strain of neck muscle, initial encounter  Laceration of scalp, initial encounter    Rx / DC Orders ED Discharge Orders     None         Ripley Fraise, MD 06/17/22 0230

## 2022-06-17 NOTE — ED Notes (Signed)
Trauma Response Nurse Documentation   [name] is a 76 y.o. male arriving to [facility] ED via EMS  On Xarelto (rivaroxaban) daily. Trauma was activated as a Level 2 by ED charge RN based on the following trauma criteria Elderly patients > 65 with head trauma on anti-coagulation (excluding ASA). Trauma team at the bedside on patient arrival.   Patient cleared for CT by Dr. Christy Gentles EDP. Pt transported to CT with trauma response nurse present to monitor. RN remained with the patient throughout their absence from the department for clinical observation.   GCS 15.  History   Past Medical History:  Diagnosis Date   Anticoagulant long-term use    xarelto--- takes for AFib,  managed by pcp, Dr Alain Marion   Benign localized prostatic hyperplasia with lower urinary tract symptoms (LUTS)    CAD (coronary artery disease) cardiologist--  dr t. Oval Linsey   hx of known CAD obstruction w/ collaterals (cath done @ [facility] 12-31-2011) ;   last cardiac cath 08-13-2019  showed sig. 2V CAD involving proxLAD and CTO of the RCA/  chronic total occlusion midRCA w/ bridging and L>R collaterals   Diastolic CHF ([place])    dx [id number] hospital admission (followed by pcp)   Diffuse large B cell lymphoma [facility]) oncologist-- dr Irene Limbo--- in remission   dx 08/ 2019 -- bx left tonsill mass-- involving lymph nodes--- completed chemotherapy 10-12-2018   Diffuse large B cell lymphoma ([place]) 05/24/2022   DJD (degenerative joint disease)    Dyspnea    Dysthymic disorder    Elevated brain natriuretic peptide (BNP) level    Environmental allergies    GAD (generalized anxiety disorder)    GERD (gastroesophageal reflux disease)    History of falling    multiple times   History of Graves' disease    1987 s/p total thyroidectomy   History of kidney stones    History of scabies    07/ 2014  resolved   History of syncope    per D/C note in epic 02-21-2013 ?vasovagal   History of vertebral compression fracture    05/ 2014  --  L1, no surgical intervention   HLD (hyperlipidemia)    Hyperlipidemia    Hypertension    Hypogonadism in male    IDA (iron deficiency anemia)    Lumbago    OA (osteoarthritis)    OSA on CPAP    cpap machine-settings 17   Osteomyelitis of right wrist ([place]) 11/02/2012   Osteoporosis    Severe   Osteoporosis with fracture 04/24/2013   Pathological fracture due to osteoporosis with delayed healing 04/24/2013   Persistent atrial fibrillation [facility])    cardiologist--- dr Alfonso Patten. [place]   Plaque psoriasis    dermatologist--- dr Harriett Sine--- currently taking otezla   Post-surgical hypothyroidism    followed by pcp---  s/p total thyroidectomy for graves disease in 1987   Pure hypercholesterolemia 02/10/2021   Recurrent UTI 06/02/2022   Renal calculus, right    Right femoral fracture ([place]) 09/29/2012   Right forearm fracture 09/29/2012   Right tibial fracture 02/05/2013   Thrombocytopenia ([facility])    Unsteady gait    uses walker   Wears hearing aid in both ears      Past Surgical History:  Procedure Laterality Date   APPENDECTOMY  2011 approx.    CARDIAC CATHETERIZATION  12-31-2011   dr j. Beatrix Fetters '@[facility]'$    normal LVF w/ multivessel CAD,  occluded RCA w/ colleterals   CATARACT EXTRACTION W/ INTRAOCULAR LENS  IMPLANT, BILATERAL  2016 approx.   COLONOSCOPY     CYSTOSCOPY WITH RETROGRADE PYELOGRAM, URETEROSCOPY AND STENT PLACEMENT Left 11/07/2017   Procedure: CYSTOSCOPY WITH RETROGRADE PYELOGRAM, URETEROSCOPY AND STENT PLACEMENT;  Surgeon: Cleon Gustin, MD;  Location: [facility];  Service: Urology;  Laterality: Left;   CYSTOSCOPY WITH RETROGRADE PYELOGRAM, URETEROSCOPY AND STENT PLACEMENT Right 10/22/2019   Procedure: CYSTOSCOPY WITH RETROGRADE PYELOGRAM, URETEROSCOPY AND STENT PLACEMENT;  Surgeon: Cleon Gustin, MD;  Location: [facility];  Service: Urology;  Laterality: Right;   CYSTOSCOPY/URETEROSCOPY/HOLMIUM LASER/STENT PLACEMENT Right 03/11/2017   Procedure:  CYSTOSCOPY/URETEROSCOPYSTENT PLACEMENT right ureter retrograde pylegram;  Surgeon: Cleon Gustin, MD;  [facility]: [facility];  Service: Urology;  Laterality: Right;   HARDWARE REMOVAL  10/05/2012   Procedure: HARDWARE REMOVAL;  Surgeon: [name], MD;  [facility]: [facility];  Service: Orthopedics;  Laterality: Right;  REMOVING  STRYKER  GAMMA NAIL   HARDWARE REMOVAL Right 07/03/2013   Procedure: HARDWARE REMOVAL RIGHT TIBIA ;  Surgeon: Rozanna Box, MD;  Location: [place];  Service: Orthopedics;  Laterality: Right;   HIP CLOSED REDUCTION Right 10/15/2013   Procedure: CLOSED MANIPULATION HIP;  Surgeon: [name], MD;  [facility];  Service: Orthopedics;  Laterality: Right;   HOLMIUM LASER APPLICATION Right 02/08/257   Procedure: HOLMIUM LASER APPLICATION;  Surgeon: Cleon Gustin, MD;  [facility];  Service: Urology;  Laterality: Right;   HOLMIUM LASER APPLICATION Left 03/07/7823   Procedure: HOLMIUM LASER APPLICATION;  Surgeon: Cleon Gustin, MD;  [facility];  Service: Urology;  Laterality: Left;   HOLMIUM LASER APPLICATION Left 11/14/5359   Procedure: HOLMIUM LASER APPLICATION;  Surgeon: Cleon Gustin, MD;  [facility];  Service: Urology;  Laterality: Left;   HOLMIUM LASER APPLICATION Right 4/43/1540   Procedure: HOLMIUM LASER APPLICATION;  Surgeon: Cleon Gustin, MD;  Location: [facility];  Service: Urology;  Laterality: Right;   INCISION AND DRAINAGE HIP Right 11/16/2013   Procedure: IRRIGATION AND DEBRIDEMENT RIGHT HIP;  Surgeon: [name], MD;  [facility];  Service: Orthopedics;  Laterality: Right;   INTRAVASCULAR PRESSURE WIRE/FFR STUDY N/A 08/13/2019   Procedure: INTRAVASCULAR PRESSURE WIRE/FFR STUDY;  Surgeon: Nelva Bush, MD;  Location: [place] [facility];  Service: Cardiovascular;  Laterality: N/A;   IR IMAGING GUIDED PORT INSERTION  06/22/2018   IR NEPHROSTOMY PLACEMENT LEFT  03/28/2019   IR URETERAL STENT LEFT NEW  ACCESS W/O SEP NEPHROSTOMY CATH  10/24/2017   IR URETERAL STENT LEFT NEW ACCESS W/O SEP NEPHROSTOMY CATH  03/26/2019   NEPHROLITHOTOMY Right 02/08/2017   Procedure: NEPHROLITHOTOMY PERCUTANEOUS WITH SURGEON ACCESS;  Surgeon: Cleon Gustin, MD;  [facility];  Service: Urology;  Laterality: Right;   NEPHROLITHOTOMY Left 10/24/2017   Procedure: NEPHROLITHOTOMY PERCUTANEOUS;  Surgeon: Cleon Gustin, MD;  [facility];  Service: Urology;  Laterality: Left;   NEPHROLITHOTOMY Left 03/26/2019   Procedure: NEPHROLITHOTOMY PERCUTANEOUS;  Surgeon: Cleon Gustin, MD;  [facility];  Service: Urology;  Laterality: Left;  2 HRS   NEPHROLITHOTOMY Left 04/17/2019   Procedure: NEPHROLITHOTOMY PERCUTANEOUS;  Surgeon: Cleon Gustin, MD;  [facility];  Service: Urology;  Laterality: Left;  2 HRS   ORIF TIBIA FRACTURE Right 02/06/2013   Procedure: OPEN REDUCTION INTERNAL FIXATION (ORIF) TIBIA FRACTURE WITH IM ROD FIBULA;  Surgeon: Rozanna Box, MD;  Location: [place];  Service: Orthopedics;  Laterality: Right;   ORIF TIBIA FRACTURE Right  07/03/2013   Procedure: RIGHT TIBIA NON UNION REPAIR ;  Surgeon: Rozanna Box, MD;  Location: [place];  Service: Orthopedics;  Laterality: Right;   ORIF WRIST FRACTURE  10/02/2012   Procedure: OPEN REDUCTION INTERNAL FIXATION (ORIF) WRIST FRACTURE;  Surgeon: Roseanne Kaufman, MD;  [facility];  Service: Orthopedics;  Laterality: Right;  WITH   ANTIBIOTIC  CEMENT   ORIF WRIST FRACTURE Left 10/28/2013   Procedure: OPEN REDUCTION INTERNAL FIXATION (ORIF) WRIST FRACTURE with allograft;  Surgeon: Roseanne Kaufman, MD;  [facility];  Service: Orthopedics;  Laterality: Left;  DVR Plate   QUADRICEPS TENDON REPAIR Left 07/15/2017   Procedure: REPAIR QUADRICEP TENDON;  Surgeon: Frederik Pear, MD;  Location: [place];  Service: Orthopedics;  Laterality: Left;   RIGHT/LEFT HEART CATH AND CORONARY ANGIOGRAPHY N/A 08/13/2019   Procedure: RIGHT/LEFT HEART CATH AND  CORONARY ANGIOGRAPHY;  Surgeon: Nelva Bush, MD;  Location: [place] [facility];  Service: Cardiovascular;  Laterality: N/A;   THYROIDECTOMY  02/1986   TOTAL HIP ARTHROPLASTY Right 03-16-2016   '@[facility]'$    TOTAL HIP REVISION  10/05/2012   Procedure: TOTAL HIP REVISION;  Surgeon: [name], MD;  [facility];  Service: Orthopedics;  Laterality: Right;  RIGHT TOTAL HIP REVISION   TOTAL HIP REVISION Right 09/17/2013   Procedure: REVISION RIGHT TOTAL HIP ARTHROPLASTY ;  Surgeon: [name], MD;  [facility];  Service: Orthopedics;  Laterality: Right;   TOTAL HIP REVISION Right 10/26/2013   Procedure: REVISION RIGHT TOTAL HIP ARTHROPLASTY;  Surgeon: [name], MD;  [facility];  Service: Orthopedics;  Laterality: Right;   TOTAL KNEE ARTHROPLASTY Bilateral right 03-15-2011;  left 06-30-2011   TOTAL KNEE REVISION Left 04/11/2017   Procedure: TOTAL KNEE REVISION PATELLA and TIBIA;  Surgeon: Frederik Pear, MD;  Location: [place];  Service: Orthopedics;  Laterality: Left;       Initial Focused Assessment (If applicable, or please see trauma documentation): Alert/oriented male presents via EMS from [place] after a mechanical fall this evening, fall backwards onto his head with laceration to posterior head Airway patent, unobstructed, BS clear No obvious uncontrolled hemorrhage, bleeding to laceration controlled with ABD applied by EMS GCS 15, PERRLA 71m  CT's Completed:   CT Head and CT C-Spine   Interventions:  CTs as above Portable chest XRAY, pelvis XRAY deferred per Dr. WChristy GentlesWound care, laceration repair TDAP  Plan for disposition:  Anticipate discharge back to facility  Consults completed:  none at the time of this note.  Event Summary: Patient arrives via EMS from [place] after a mechanical fall backwards onto his head. No LOC,approx 1 inch laceration to posterior head with bleeding controlled. Abrasion to posterior left foot. TDAP updated in  ED.  MTP Summary (If applicable):  NA  Bedside handoff with ED RN MStanton Kidney    Teshia Mahone O Mohamad Bruso  Trauma Response RN  Please call TRN at [phone] further assistance.

## 2022-06-17 NOTE — Progress Notes (Signed)
Orthopedic Tech Progress Note Patient Details:  [name] Dec 20, 1945 [id number]  Patient ID: [name], male   DOB: [birth year], 76 y.o.   MRN: [id number] Level II; not currently needed. Vernona Rieger 06/17/2022, 12:33 AM

## 2022-06-17 NOTE — ED Notes (Signed)
Suture cart at bedside 

## 2022-06-17 NOTE — ED Notes (Signed)
Pt being transported to CT

## 2022-06-17 NOTE — ED Triage Notes (Signed)
Pt to ED via EMS from [facility]. Pt had mechanical fall around 10pm tonight. Pt is on xarelto. Pt states he did hit his head on the hardwood floor. No LOC. Pt has 1 inch lac to back of head. EMS attempted to place pt in c-collar, but was removed d/t pt being unable to tolerate collar. Pt has had some confusion throughout the night. Pt is AAOx4 upon arrival to ED. No known hx of dementia. Pt states he has been dealing with a UTI for the last several months. Pt has hx of a fib.   EMS Vitals: 140/80 70-90 HR 100% RA 20 RR 20 LFA 108 CBG

## 2022-06-18 ENCOUNTER — Telehealth: Payer: Self-pay

## 2022-06-18 NOTE — Telephone Encounter (Signed)
[name] is calling to inform Dr. Alain Marion that the pt is [facility].  Pt has had several falls resulting in [place] in the head currently.  [name] states that as of yesterday 06/17/22 pt is under Palliative care.  [name] cb number [phone].

## 2022-06-19 DIAGNOSIS — J449 Chronic obstructive pulmonary disease, unspecified: Secondary | ICD-10-CM | POA: Diagnosis not present

## 2022-06-19 DIAGNOSIS — I4891 Unspecified atrial fibrillation: Secondary | ICD-10-CM | POA: Diagnosis not present

## 2022-06-19 DIAGNOSIS — I5032 Chronic diastolic (congestive) heart failure: Secondary | ICD-10-CM | POA: Diagnosis not present

## 2022-06-19 DIAGNOSIS — W19XXXD Unspecified fall, subsequent encounter: Secondary | ICD-10-CM | POA: Diagnosis not present

## 2022-06-19 DIAGNOSIS — N401 Enlarged prostate with lower urinary tract symptoms: Secondary | ICD-10-CM | POA: Diagnosis not present

## 2022-06-19 MED ORDER — CLONAZEPAM 2 MG PO TABS: 1.0000 mg | ORAL_TABLET | Freq: Three times a day (TID) | ORAL | 2 refills | Status: DC | PRN

## 2022-06-19 NOTE — Telephone Encounter (Signed)
Okay.  Thanks.

## 2022-06-19 NOTE — Telephone Encounter (Signed)
Noted.  I am sorry.  What I need to do?  Thank you

## 2022-06-21 DIAGNOSIS — C8339 Diffuse large B-cell lymphoma, extranodal and solid organ sites: Secondary | ICD-10-CM | POA: Diagnosis not present

## 2022-06-21 NOTE — Telephone Encounter (Signed)
He wasn't requesting anything from you at that time just wanted to keep you informed.

## 2022-06-23 DIAGNOSIS — C8330 Diffuse large B-cell lymphoma, unspecified site: Secondary | ICD-10-CM | POA: Diagnosis not present

## 2022-06-24 DIAGNOSIS — L97829 Non-pressure chronic ulcer of other part of left lower leg with unspecified severity: Secondary | ICD-10-CM | POA: Diagnosis not present

## 2022-06-25 DIAGNOSIS — N3941 Urge incontinence: Secondary | ICD-10-CM | POA: Diagnosis not present

## 2022-06-25 DIAGNOSIS — R351 Nocturia: Secondary | ICD-10-CM | POA: Diagnosis not present

## 2022-06-25 DIAGNOSIS — R972 Elevated prostate specific antigen [PSA]: Secondary | ICD-10-CM | POA: Diagnosis not present

## 2022-06-25 DIAGNOSIS — N401 Enlarged prostate with lower urinary tract symptoms: Secondary | ICD-10-CM | POA: Diagnosis not present

## 2022-06-28 ENCOUNTER — Other Ambulatory Visit: Payer: Self-pay | Admitting: Hematology

## 2022-06-28 DIAGNOSIS — Z7189 Other specified counseling: Secondary | ICD-10-CM

## 2022-06-28 DIAGNOSIS — C8330 Diffuse large B-cell lymphoma, unspecified site: Secondary | ICD-10-CM

## 2022-06-29 ENCOUNTER — Telehealth: Payer: Self-pay | Admitting: Cardiovascular Disease

## 2022-06-29 ENCOUNTER — Encounter: Payer: Self-pay | Admitting: Hematology

## 2022-06-29 MED ORDER — ALLOPURINOL 100 MG PO TABS: 100.0000 mg | ORAL_TABLET | Freq: Two times a day (BID) | ORAL | 0 refills | Status: DC

## 2022-06-29 NOTE — Telephone Encounter (Signed)
Pt c/o medication issue:  1. Name of Medication: rivaroxaban (XARELTO) 20 MG TABS tablet  Eliquis   2. How are you currently taking this medication (dosage and times per day)?   3. Are you having a reaction (difficulty breathing--STAT)?   4. What is your medication issue?  Butch Penny from [place] is calling to get clarification for patients medication. She states that patient was prescribed both of these medications. She believes Eliquis may have been started in hosp, but needs call back for clarification.

## 2022-06-29 NOTE — Telephone Encounter (Signed)
Called patient and spouse to clarify what he is taking. Pt. Partner states that he has been Xarelto, but when he went into the rehab facility they put him on the Eliquis. He was switched back to Xarelto when he came home. He took the last dose of Eliquis on Friday and then switched backed to Xarelto when he came home. They found out that he wasn't taking several of the medicines that he was supposed to at the facility, due to them not being given. They were never told why he was switched form Xarelto to Eliquis when he was in the facility.   Routing to Laurann Montana, NP to ensure patient should continue with Xarelto and then will update [employer].

## 2022-06-30 ENCOUNTER — Ambulatory Visit (INDEPENDENT_AMBULATORY_CARE_PROVIDER_SITE_OTHER): Payer: Medicare PPO | Admitting: Infectious Disease

## 2022-06-30 ENCOUNTER — Encounter: Payer: Self-pay | Admitting: Infectious Disease

## 2022-06-30 ENCOUNTER — Other Ambulatory Visit: Payer: Self-pay

## 2022-06-30 ENCOUNTER — Telehealth (HOSPITAL_BASED_OUTPATIENT_CLINIC_OR_DEPARTMENT_OTHER): Payer: Self-pay

## 2022-06-30 ENCOUNTER — Telehealth: Payer: Self-pay

## 2022-06-30 VITALS — BP 100/64 | HR 88 | Temp 97.7°F | Ht 76.0 in | Wt 254.0 lb

## 2022-06-30 DIAGNOSIS — C8330 Diffuse large B-cell lymphoma, unspecified site: Secondary | ICD-10-CM | POA: Diagnosis not present

## 2022-06-30 DIAGNOSIS — I951 Orthostatic hypotension: Secondary | ICD-10-CM | POA: Diagnosis not present

## 2022-06-30 DIAGNOSIS — Z5181 Encounter for therapeutic drug level monitoring: Secondary | ICD-10-CM | POA: Diagnosis not present

## 2022-06-30 DIAGNOSIS — N390 Urinary tract infection, site not specified: Secondary | ICD-10-CM | POA: Diagnosis not present

## 2022-06-30 DIAGNOSIS — I5032 Chronic diastolic (congestive) heart failure: Secondary | ICD-10-CM | POA: Diagnosis not present

## 2022-06-30 DIAGNOSIS — Z7189 Other specified counseling: Secondary | ICD-10-CM | POA: Diagnosis not present

## 2022-06-30 LAB — CBC
Hematocrit: 37.9 % (ref 37.5–51.0)
Hemoglobin: 12.9 g/dL — ABNORMAL LOW (ref 13.0–17.7)
MCH: 33 pg (ref 26.6–33.0)
MCHC: 34 g/dL (ref 31.5–35.7)
MCV: 97 fL (ref 79–97)
Platelets: 162 10*3/uL (ref 150–450)
RBC: 3.91 x10E6/uL — ABNORMAL LOW (ref 4.14–5.80)
RDW: 14.5 % (ref 11.6–15.4)
WBC: 7.6 10*3/uL (ref 3.4–10.8)

## 2022-06-30 MED ORDER — ALLOPURINOL 100 MG PO TABS: 100.0000 mg | ORAL_TABLET | Freq: Two times a day (BID) | ORAL | 1 refills | Status: DC

## 2022-06-30 NOTE — Telephone Encounter (Signed)
No hematuria, melena, or falls. They did figure out he has another UTI, but caught it early and he is about to start treatment.     ----- Message from Loel Dubonnet, NP sent at 06/30/2022  1:27 PM EDT ----- Juluis Rainier in case he calls back if we could just ensure no hematuria, melena, no recurrent falls.

## 2022-06-30 NOTE — Telephone Encounter (Signed)
Called and spoke with patient's spouse, [name], per patient request when he was in the office today.  Communicated to [name] that I spoke with Carolynn Sayers at [facility], and let her know that new OPAT orders were placed today by Dr. Tommy Medal, for IV ertapenem via patient's port for 14 days. [name] verbalized understanding and stated that he would anticipate a call from [facility] and/or the home health agency (the patient was already planning to utilize [facility] for services post discharge from skilled nursing facility this past Saturday). All questions answered.  Pam verbalized understanding of orders, and Epic community message was also sent to [facility] team.  Binnie Kand, RN

## 2022-06-30 NOTE — Telephone Encounter (Signed)
Anticipate Eliquis was likely what was on formulary at the skilled nursing facility.  CHA2DS2-VASc Score = 4 [CHF History: 1, HTN History: 1, Diabetes History: 0, Stroke History: 0, Vascular Disease History: 0, Age Score: 2, Gender Score: 0].  Therefore, the patient's annual risk of stroke is 4.8 %.      Should remain on anticoagulation to reduce stroke risk study of atrial fibrillation so long as benefits outweigh risk.  However, multiple recent ED visit was with falls.  Xarelto '20mg'$  daily dose appropriate (CrCl 71 mL/min).  If falls resolving - continue Xarelto '20mg'$  daily. Could consider switch to Eliquis as it does have slightly slower bleeding risk but would have to take twice per day.   If still with recurrent falls - discussion of risk vs benefit of remaining on [place].  Called husband, [name], to discuss. Left VM per DPR to request call back.  Loel Dubonnet, NP

## 2022-06-30 NOTE — Telephone Encounter (Addendum)
Routing to inbasket  ----- Message from Loel Dubonnet, NP sent at 06/30/2022  1:27 PM EDT ----- Juluis Rainier in case he calls back if we could just ensure no hematuria, melena, no recurrent falls.

## 2022-06-30 NOTE — Telephone Encounter (Signed)
As no hematuria, melena, falls - continue Xarelto '20mg'$  daily.  If he or his husband note bleeding complications, more frequent falls please have them contact us and we will continue to carefully monitor risk vs benefit of anticoagulation.   Loel Dubonnet, NP

## 2022-06-30 NOTE — Telephone Encounter (Signed)
See duplicate encounter.   Aneka Fagerstrom S Allante Beane, NP  

## 2022-06-30 NOTE — Progress Notes (Signed)
Subjective:  Chief complaint: Follow-up for extended spectrum beta-lactamase producing E. coli colonization plus/recurrent infection   Patient ID: [name], male    DOB: 1946-01-16, 76 y.o.   MRN: [id number]  HPI  [name] is a 76 year old Caucasian man with a history of coronary disease unfortunately recurrent diffuse large B-cell lymphoma, history of renal calculi including a staghorn calculus problems with voiding urinary incontinence who is recently in July admitted to the hospital with severe urinary tract infection due to ESBL.  He was given meropenem in the hospital and then sent home with Macrobid.  He was feeling better while taking the meropenem and Macrobid but then began to feel poorly again with malaise.  Note before he was diagnosed with a severe infection he did not have much in the way of urinary symptoms other than polyuria.  He did not have dysuria or suprapubic pain or clear-cut back pain.  However he had begun to become confused and encephalopathic and was calling the wrong numbers and saying things that did not make sense to his friends.  Ultimately his husband was able to convince him to come in to the ER where he was hospitalized he was seen by his oncologist Dr. Irene Limbo who  was concerned by patient's apparent recurrence of malaise and symptoms and he was initiated on meropenem for an 11-day course.  He was able to get this at home via his port and his husband was able to administer antibiotics for the port after teaching from home health.  He was continuing to struggle with multiple medical problems including orthostatic hypotension deconditioning and frequent falls the weekend prior to my seeing him in clinic.  He was given a prescription of Augmentin for presumed urinary tract infection by his primary care physician though is not clear that he has symptoms that are clearly due to urinary tract infection.  One of the falls he had sounds to have been due to  proprioceptive problems he did strike his knee which is has a wound which does not appear overtly infected.  His urine was analyzed at PCPs office but no culture done there is a urine culture done by the home health company which showed E coli    ** S = Susceptible; I = Intermediate; R = Resistant **                    P = Positive; N = Negative             MICS are expressed in micrograms per mL    Antibiotic                 RSLT#1    RSLT#2    RSLT#3    RSLT#4 Amoxicillin/Clavulanic Acid    S Ampicillin                     R Cefazolin                      R Cefepime                       S Ceftriaxone                    R Cefuroxime                     R Ciprofloxacin  R Ertapenem                      S Gentamicin                     R Imipenem                       S Levofloxacin                   R Meropenem                      S Nitrofurantoin                 S Piperacillin/Tazobactam        S Tetracycline                   R Tobramycin                     R Trimethoprim/Sulfa             R  I saw him I was not entirely convinced that he had a urinary tract infection due to the absence of specific urinary symptoms.  I was bothered by the fact that he had fallen 3 times in by some of his other systemic symptoms though it was not clear to me that they were due to an infection.  I decided to err on the side of being aggressive with antibiotics and initiated IV ertapenem I also asked for him to receive a bolus of normal saline.  In the chart he does have an extensive history of kidney stones including a staghorn calculus at one point in time.  In talking to  him at last visit todae he is not sure if he is better or not.  His husband  however tells me that since the biotics have started there has been no more falling and no more episodes of confusion and of [name] not making sense.  As I last saw him he has had multiple falls and been in the ER on multiple  occasions and now is residing at a skilled nursing facility.  The most recent visit to the ER after a fall he improved with fluids and hydration urine culture was taken which had mixed flora.  There were multiple potential explanations for his fall including sedation, atrial fibrillation orthostatic hypotension.  Per the ER notes he was on amoxicillin to treat "a urinary tract infection.  He has been seen by [facility] obtained cultures 2 days ago which are shown below         Today he tells me today that he can feel some burning coming on a little bit.  His husband also says that the color of the urine and the odor of changed.  Note these latter characters is not something we used to make a diagnosis of urine tract infection.     Past Medical History:  Diagnosis Date   Anticoagulant long-term use    xarelto--- takes for AFib,  managed by pcp, Dr Alain Marion   Benign localized prostatic hyperplasia with lower urinary tract symptoms (LUTS)    CAD (coronary artery disease) cardiologist--  dr t. Oval Linsey   hx of known CAD obstruction w/ collaterals (cath done @ [facility] 12-31-2011) ;   last cardiac cath 08-13-2019  showed sig. 2V CAD involving proxLAD and CTO of the  RCA/  chronic total occlusion midRCA w/ bridging and L>R collaterals   Diastolic CHF ([place])    dx [id number] hospital admission (followed by pcp)   Diffuse large B cell lymphoma [facility]) oncologist-- dr Irene Limbo--- in remission   dx 08/ 2019 -- bx left tonsill mass-- involving lymph nodes--- completed chemotherapy 10-12-2018   Diffuse large B cell lymphoma ([place]) 05/24/2022   DJD (degenerative joint disease)    Dyspnea    Dysthymic disorder    Elevated brain natriuretic peptide (BNP) level    Environmental allergies    GAD (generalized anxiety disorder)    GERD (gastroesophageal reflux disease)    History of falling    multiple times   History of Graves' disease    1987 s/p total thyroidectomy   History of kidney stones     History of scabies    07/ 2014  resolved   History of syncope    per D/C note in epic 02-21-2013 ?vasovagal   History of vertebral compression fracture    05/ 2014 --  L1, no surgical intervention   HLD (hyperlipidemia)    Hyperlipidemia    Hypertension    Hypogonadism in male    IDA (iron deficiency anemia)    Lumbago    OA (osteoarthritis)    OSA on CPAP    cpap machine-settings 17   Osteomyelitis of right wrist ([place]) 11/02/2012   Osteoporosis    Severe   Osteoporosis with fracture 04/24/2013   Pathological fracture due to osteoporosis with delayed healing 04/24/2013   Persistent atrial fibrillation [facility])    cardiologist--- dr Alfonso Patten. [place]   Plaque psoriasis    dermatologist--- dr Harriett Sine--- currently taking otezla   Post-surgical hypothyroidism    followed by pcp---  s/p total thyroidectomy for graves disease in 1987   Pure hypercholesterolemia 02/10/2021   Recurrent UTI 06/02/2022   Renal calculus, right    Right femoral fracture ([place]) 09/29/2012   Right forearm fracture 09/29/2012   Right tibial fracture 02/05/2013   Thrombocytopenia ([facility])    Unsteady gait    uses walker   Wears hearing aid in both ears     Past Surgical History:  Procedure Laterality Date   APPENDECTOMY  2011 approx.    CARDIAC CATHETERIZATION  12-31-2011   dr j. Beatrix Fetters '@[facility]'$    normal LVF w/ multivessel CAD,  occluded RCA w/ colleterals   CATARACT EXTRACTION W/ INTRAOCULAR LENS  IMPLANT, BILATERAL  2016 approx.   COLONOSCOPY     CYSTOSCOPY WITH RETROGRADE PYELOGRAM, URETEROSCOPY AND STENT PLACEMENT Left 11/07/2017   Procedure: CYSTOSCOPY WITH RETROGRADE PYELOGRAM, URETEROSCOPY AND STENT PLACEMENT;  Surgeon: Cleon Gustin, MD;  Location: [facility];  Service: Urology;  Laterality: Left;   CYSTOSCOPY WITH RETROGRADE PYELOGRAM, URETEROSCOPY AND STENT PLACEMENT Right 10/22/2019   Procedure: CYSTOSCOPY WITH RETROGRADE PYELOGRAM, URETEROSCOPY AND STENT PLACEMENT;  Surgeon: Cleon Gustin,  MD;  Location: [facility];  Service: Urology;  Laterality: Right;   CYSTOSCOPY/URETEROSCOPY/HOLMIUM LASER/STENT PLACEMENT Right 03/11/2017   Procedure: CYSTOSCOPY/URETEROSCOPYSTENT PLACEMENT right ureter retrograde pylegram;  Surgeon: Cleon Gustin, MD;  [facility];  Service: Urology;  Laterality: Right;   HARDWARE REMOVAL  10/05/2012   Procedure: HARDWARE REMOVAL;  Surgeon: [name], MD;  [facility];  Service: Orthopedics;  Laterality: Right;  REMOVING  STRYKER  GAMMA NAIL   HARDWARE REMOVAL Right 07/03/2013   Procedure: HARDWARE REMOVAL RIGHT TIBIA ;  Surgeon: Rozanna Box, MD;  Location: [place];  Service: Orthopedics;  Laterality: Right;   HIP CLOSED REDUCTION Right 10/15/2013   Procedure: CLOSED MANIPULATION HIP;  Surgeon: [name], MD;  [facility];  Service: Orthopedics;  Laterality: Right;   HOLMIUM LASER APPLICATION Right 06/17/6733   Procedure: HOLMIUM LASER APPLICATION;  Surgeon: Cleon Gustin, MD;  [facility];  Service: Urology;  Laterality: Right;   HOLMIUM LASER APPLICATION Left 1/93/7902   Procedure: HOLMIUM LASER APPLICATION;  Surgeon: Cleon Gustin, MD;  [facility];  Service: Urology;  Laterality: Left;   HOLMIUM LASER APPLICATION Left 4/0/9735   Procedure: HOLMIUM LASER APPLICATION;  Surgeon: Cleon Gustin, MD;  [facility];  Service: Urology;  Laterality: Left;   HOLMIUM LASER APPLICATION Right 01/06/9241   Procedure: HOLMIUM LASER APPLICATION;  Surgeon: Cleon Gustin, MD;  Location: [facility];  Service: Urology;  Laterality: Right;   INCISION AND DRAINAGE HIP Right 11/16/2013   Procedure: IRRIGATION AND DEBRIDEMENT RIGHT HIP;  Surgeon: [name], MD;  [facility];  Service: Orthopedics;  Laterality: Right;   INTRAVASCULAR PRESSURE WIRE/FFR STUDY N/A 08/13/2019   Procedure: INTRAVASCULAR PRESSURE WIRE/FFR STUDY;  Surgeon: Nelva Bush, MD;  Location: [place] [facility];  Service: Cardiovascular;  Laterality: N/A;   IR IMAGING GUIDED PORT INSERTION  06/22/2018   IR NEPHROSTOMY PLACEMENT LEFT  03/28/2019   IR URETERAL STENT LEFT NEW ACCESS W/O SEP NEPHROSTOMY CATH  10/24/2017   IR URETERAL STENT LEFT NEW ACCESS W/O SEP NEPHROSTOMY CATH  03/26/2019   NEPHROLITHOTOMY Right 02/08/2017   Procedure: NEPHROLITHOTOMY PERCUTANEOUS WITH SURGEON ACCESS;  Surgeon: Cleon Gustin, MD;  [facility];  Service: Urology;  Laterality: Right;   NEPHROLITHOTOMY Left 10/24/2017   Procedure: NEPHROLITHOTOMY PERCUTANEOUS;  Surgeon: Cleon Gustin, MD;  [facility];  Service: Urology;  Laterality: Left;   NEPHROLITHOTOMY Left 03/26/2019   Procedure: NEPHROLITHOTOMY PERCUTANEOUS;  Surgeon: Cleon Gustin, MD;  [facility];  Service: Urology;  Laterality: Left;  2 HRS   NEPHROLITHOTOMY Left 04/17/2019   Procedure: NEPHROLITHOTOMY PERCUTANEOUS;  Surgeon: Cleon Gustin, MD;  [facility];  Service: Urology;  Laterality: Left;  2 HRS   ORIF TIBIA FRACTURE Right 02/06/2013   Procedure: OPEN REDUCTION INTERNAL FIXATION (ORIF) TIBIA FRACTURE WITH IM ROD FIBULA;  Surgeon: Rozanna Box, MD;  Location: [place];  Service: Orthopedics;  Laterality: Right;   ORIF TIBIA FRACTURE Right 07/03/2013   Procedure: RIGHT TIBIA NON UNION REPAIR ;  Surgeon: Rozanna Box, MD;  Location: [place];  Service: Orthopedics;  Laterality: Right;   ORIF WRIST FRACTURE  10/02/2012   Procedure: OPEN REDUCTION INTERNAL FIXATION (ORIF) WRIST FRACTURE;  Surgeon: Roseanne Kaufman, MD;  [facility];  Service: Orthopedics;  Laterality: Right;  WITH   ANTIBIOTIC  CEMENT   ORIF WRIST FRACTURE Left 10/28/2013   Procedure: OPEN REDUCTION INTERNAL FIXATION (ORIF) WRIST FRACTURE with allograft;  Surgeon: Roseanne Kaufman, MD;  [facility];  Service: Orthopedics;  Laterality: Left;  DVR Plate   QUADRICEPS TENDON REPAIR Left 07/15/2017   Procedure: REPAIR QUADRICEP TENDON;  Surgeon: Frederik Pear,  MD;  Location: [place];  Service: Orthopedics;  Laterality: Left;   RIGHT/LEFT HEART CATH AND CORONARY ANGIOGRAPHY N/A 08/13/2019   Procedure: RIGHT/LEFT HEART CATH AND CORONARY ANGIOGRAPHY;  Surgeon: Nelva Bush, MD;  Location: [place] [facility];  Service: Cardiovascular;  Laterality: N/A;   THYROIDECTOMY  02/1986   TOTAL HIP ARTHROPLASTY Right 03-16-2016   '@[facility]'$    TOTAL  HIP REVISION  10/05/2012   Procedure: TOTAL HIP REVISION;  Surgeon: [name], MD;  [facility];  Service: Orthopedics;  Laterality: Right;  RIGHT TOTAL HIP REVISION   TOTAL HIP REVISION Right 09/17/2013   Procedure: REVISION RIGHT TOTAL HIP ARTHROPLASTY ;  Surgeon: [name], MD;  [facility];  Service: Orthopedics;  Laterality: Right;   TOTAL HIP REVISION Right 10/26/2013   Procedure: REVISION RIGHT TOTAL HIP ARTHROPLASTY;  Surgeon: [name], MD;  [facility];  Service: Orthopedics;  Laterality: Right;   TOTAL KNEE ARTHROPLASTY Bilateral right 03-15-2011;  left 06-30-2011   TOTAL KNEE REVISION Left 04/11/2017   Procedure: TOTAL KNEE REVISION PATELLA and TIBIA;  Surgeon: Frederik Pear, MD;  Location: [place];  Service: Orthopedics;  Laterality: Left;    Family History  Problem Relation Age of Onset   CAD Father 26   Asthma Father    Alcohol abuse Father    Arthritis Mother    Alcohol abuse Sister       Social History   Socioeconomic History   Marital status: Married    Spouse name: Not on file   Number of children: Not on file   Years of education: Not on file   Highest education level: Not on file  Occupational History   Not on file  Tobacco Use   Smoking status: Former    Packs/day: 1.00    Years: 50.00    Total pack years: 50.00    Types: Cigarettes    Quit date: 01/12/2012    Years since quitting: 10.4   Smokeless tobacco: Never  Vaping Use   Vaping Use: Never used  Substance and Sexual Activity   Alcohol use: Yes    Comment: rarely   Drug use: Never   Sexual  activity: Yes  Other Topics Concern   Not on file  Social History Narrative   [place] 2.5 months, went home Feb 21st slipped and fell on back and developed.  Home PT/OT.  Just started outpatient physical therapy.  Friday night, misstepped.       Pt spouse verbalized pt had fallen 4 times within the last 24 hours     Social Determinants of Health   Financial Resource Strain: Low Risk  (01/07/2022)   Overall Financial Resource Strain (CARDIA)    Difficulty of Paying Living Expenses: Not hard at all  Food Insecurity: No Food Insecurity (01/07/2022)   Hunger Vital Sign    Worried About Running Out of Food in the Last Year: Never true    Ran Out of Food in the Last Year: Never true  Transportation Needs: No Transportation Needs (01/07/2022)   PRAPARE - Hydrologist (Medical): No    Lack of Transportation (Non-Medical): No  Physical Activity: Sufficiently Active (01/07/2022)   Exercise Vital Sign    Days of Exercise per Week: 3 days    Minutes of Exercise per Session: 60 min  Stress: No Stress Concern Present (01/07/2022)   [place]    Feeling of Stress : Not at all  Social Connections: [place] (01/07/2022)   Social Connection and Isolation Panel [NHANES]    Frequency of Communication with Friends and Family: More than three times a week    Frequency of Social Gatherings with Friends and Family: More than three times a week    Attends Religious Services: 1 to 4 times per year    Active  Member of Clubs or Organizations: No    Attends Music therapist: 1 to 4 times per year    Marital Status: Married    Allergies  Allergen Reactions   Short Ragweed Pollen Ext Cough     Current Outpatient Medications:    acetaminophen (TYLENOL) 325 MG tablet, Take 2 tablets (650 mg total) by mouth every 6 (six) hours as needed for mild pain (or Fever >/= 101)., Disp: 20 tablet, Rfl:     albuterol (VENTOLIN HFA) 108 (90 Base) MCG/ACT inhaler, Inhale 2 puffs into the lungs every 6 (six) hours as needed for wheezing or shortness of breath., Disp: , Rfl:    alfuzosin (UROXATRAL) 10 MG 24 hr tablet, TAKE 1 TABLET AT BEDTIME (Patient not taking: Reported on 06/11/2022), Disp: 90 tablet, Rfl: 3   allopurinol (ZYLOPRIM) 100 MG tablet, Take 1 tablet (100 mg total) by mouth 2 (two) times daily., Disp: 60 tablet, Rfl: 0   amoxicillin-clavulanate (AUGMENTIN) 875-125 MG tablet, Take 1 tablet by mouth 2 (two) times daily. (Patient not taking: Reported on 06/11/2022), Disp: 20 tablet, Rfl: 1   atorvastatin (LIPITOR) 80 MG tablet, Take 80 mg by mouth at bedtime., Disp: , Rfl:    buPROPion (WELLBUTRIN XL) 300 MG 24 hr tablet, TAKE 1 TABLET EVERY DAY WITH BREAKFAST (Patient taking differently: Take 300 mg by mouth daily with breakfast.), Disp: 90 tablet, Rfl: 3   Calcium Citrate-Vitamin D (CALCIUM CITRATE + D PO), Take 1 tablet by mouth 2 (two) times daily. '1200mg'$  of Calcium and 1000 units vitamin D3, Disp: , Rfl:    clonazePAM (KLONOPIN) 2 MG tablet, Take 0.5 tablets (1 mg total) by mouth 3 (three) times daily as needed for anxiety., Disp: 90 tablet, Rfl: 2   CRANBERRY PO, Take 2 tablets by mouth every morning., Disp: , Rfl:    denosumab (PROLIA) 60 MG/ML SOSY injection, Inject 60 mg as directed every 6 (six) months., Disp: , Rfl:    dexamethasone (DECADRON) 4 MG tablet, Take 5 tablets (20 mg total) by mouth daily. Start the day after carboplatin chemotherapy for 3 days. (Patient not taking: Reported on 06/02/2022), Disp: 30 tablet, Rfl: 3   diltiazem (CARDIZEM CD) 180 MG 24 hr capsule, Take 1 capsule by mouth daily. (Patient not taking: Reported on 06/11/2022), Disp: 90 capsule, Rfl: 3   Docusate Sodium 100 MG capsule, Take 100 mg by mouth 2 (two) times daily., Disp: , Rfl:    DULoxetine (CYMBALTA) 60 MG capsule, TAKE 1 CAPSULE TWICE DAILY (Patient taking differently: Take 60 mg by mouth 2 (two) times  daily.), Disp: 180 capsule, Rfl: 3   EPINEPHrine 0.3 mg/0.3 mL IJ SOAJ injection, Inject 0.3 mg into the muscle once as needed for anaphylaxis (severe allergic reaction). (Patient not taking: Reported on 06/02/2022), Disp: , Rfl:    ezetimibe (ZETIA) 10 MG tablet, TAKE 1 TABLET EVERY DAY (Patient not taking: Reported on 06/11/2022), Disp: 90 tablet, Rfl: 2   feeding supplement (ENSURE ENLIVE / ENSURE PLUS) LIQD, Take 237 mLs by mouth 2 (two) times daily between meals. (Patient taking differently: Take 237 mLs by mouth daily.), Disp: 237 mL, Rfl: 12   HYDROcodone-acetaminophen (NORCO) 7.5-325 MG tablet, Take 1 tablet by mouth every 6 (six) hours as needed for severe pain., Disp: 10 tablet, Rfl: 0   levothyroxine (SYNTHROID) 100 MCG tablet, Take 1 tablet (100 mcg total) by mouth daily before breakfast., Disp: 90 tablet, Rfl: 3   lidocaine-prilocaine (EMLA) cream, Apply 1 Application topically as needed. (  Patient taking differently: Apply 1 Application topically as needed (For port access/chemotherapy).), Disp: 30 g, Rfl: 0   loratadine (CLARITIN) 10 MG tablet, Take 10 mg by mouth every morning., Disp: , Rfl:    Magnesium 500 MG CAPS, Take 1 capsule (500 mg total) by mouth daily. (Patient taking differently: Take 500 mg by mouth daily with supper.), Disp: 30 capsule, Rfl: 1   Menthol, Topical Analgesic, (BIOFREEZE) 10 % CREA, Apply 1 Application topically daily as needed (Knee pain)., Disp: , Rfl:    metoprolol tartrate (LOPRESSOR) 25 MG tablet, Take 1 tablet (25 mg total) by mouth 2 (two) times daily. (Patient not taking: Reported on 06/11/2022), Disp: 180 tablet, Rfl: 3   Multiple Vitamin (MULTIVITAMIN WITH MINERALS) TABS tablet, Take 1 tablet by mouth every morning. Men's One-A-Day 50+, Disp: , Rfl:    MYRBETRIQ 25 MG TB24 tablet, TAKE 1 TABLET EVERY DAY (Patient taking differently: Take 25 mg by mouth at bedtime.), Disp: 90 tablet, Rfl: 3   nitroGLYCERIN (NITROSTAT) 0.4 MG SL tablet, Place 1 tablet (0.4 mg  total) under the tongue every 5 (five) minutes as needed for chest pain (max 3 doses). (Patient not taking: Reported on 06/02/2022), Disp: 25 tablet, Rfl: 4   omeprazole (PRILOSEC) 40 MG capsule, TAKE 1 CAPSULE EVERY DAY BEFORE BREAKFAST (Patient not taking: Reported on 06/11/2022), Disp: 90 capsule, Rfl: 3   ondansetron (ZOFRAN) 8 MG tablet, Take 1 tablet (8 mg total) by mouth 2 (two) times daily as needed. Start on the 3rd day after cisplatin chemotherapy. (Patient not taking: Reported on 06/02/2022), Disp: 30 tablet, Rfl: 1   Potassium Citrate 15 MEQ (1620 MG) TBCR, Take 1 tablet by mouth See admin instructions. Take one tablet (15 meq) by mouth twice daily - with lunch and supper (Patient taking differently: Take 1 tablet by mouth in the morning and at bedtime.), Disp: 180 tablet, Rfl: 3   Probiotic Product (PROBIOTIC PO), Take 1 capsule by mouth daily with breakfast., Disp: , Rfl:    prochlorperazine (COMPAZINE) 10 MG tablet, Take 1 tablet (10 mg total) by mouth every 6 (six) hours as needed (Nausea or vomiting). (Patient not taking: Reported on 06/11/2022), Disp: 30 tablet, Rfl: 1   rivaroxaban (XARELTO) 20 MG TABS tablet, Take 1 tablet (20 mg total) by mouth daily with supper., Disp: 90 tablet, Rfl: 3   testosterone enanthate (DELATESTRYL) 200 MG/ML injection, INJECT 1ML ('200MG'$ ) INTO THE MUSCLE EVERY 14 DAYS. DISCARD VIAL AFTER 28 DAYS. (Patient taking differently: Inject 200 mg into the muscle every 14 (fourteen) days. Every other Friday), Disp: 5 mL, Rfl: 3   vitamin C (ASCORBIC ACID) 500 MG tablet, Take 500 mg by mouth daily with lunch., Disp: , Rfl:    Review of Systems  Constitutional:  Positive for fatigue. Negative for activity change, appetite change, chills, diaphoresis, fever and unexpected weight change.  HENT:  Negative for congestion, rhinorrhea, sinus pressure, sneezing, sore throat and trouble swallowing.   Eyes:  Negative for photophobia and visual disturbance.  Respiratory:  Negative  for cough, chest tightness, shortness of breath, wheezing and stridor.   Cardiovascular:  Negative for chest pain, palpitations and leg swelling.  Gastrointestinal:  Negative for abdominal distention, abdominal pain, anal bleeding, blood in stool, constipation, diarrhea, nausea and vomiting.  Genitourinary:  Positive for dysuria. Negative for difficulty urinating, flank pain and hematuria.  Musculoskeletal:  Negative for arthralgias, back pain, gait problem, joint swelling and myalgias.  Skin:  Negative for color change, pallor, rash and wound.  Neurological:  Positive for dizziness and weakness. Negative for tremors and light-headedness.  Hematological:  Negative for adenopathy. Does not bruise/bleed easily.  Psychiatric/Behavioral:  Negative for agitation, behavioral problems, confusion, decreased concentration, dysphoric mood and sleep disturbance.        Objective:   Physical Exam Constitutional:      Appearance: He is well-developed.  HENT:     Head: Normocephalic and atraumatic.  Eyes:     Conjunctiva/sclera: Conjunctivae normal.  Cardiovascular:     Rate and Rhythm: Normal rate and regular rhythm.  Pulmonary:     Effort: Pulmonary effort is normal. No respiratory distress.     Breath sounds: No wheezing.  Abdominal:     General: There is no distension.     Palpations: Abdomen is soft.  Musculoskeletal:        General: No tenderness.     Cervical back: Normal range of motion and neck supple.  Skin:    General: Skin is warm and dry.     Coloration: Skin is pale.     Findings: No erythema or rash.  Neurological:     General: No focal deficit present.     Mental Status: He is alert and oriented to person, place, and time.  Psychiatric:        Mood and Affect: Mood normal.        Behavior: Behavior normal.        Thought Content: Thought content normal.        Judgment: Judgment normal.     Port is accessed and site is clean          Assessment & Plan:   Colonization with ESBL and recurrent urinary tract infections: Is not clear to me that he is developing a urinary tract infection with ESBL found on culture 2 days ago but given his tenuous clinical status and the possibility that these episodes of abrupt encephalopathy are related to infection I am going to treat him again with a 14-day course of ertapenem.  I have also sent in a 90-day supply of the allopurinol that his PCP is prescribing to reduce his uric acid and help with his stone burden.    Diagnosis: ESBL UTI  Culture Result:ESBL  Allergies  Allergen Reactions   Short Ragweed Pollen Ext Cough    OPAT Orders Discharge antibiotics to be given via PORT Discharge antibiotics: Ertapenem 1 g IV daily Duration: 14d End Date: October 5th  [facility] Per Protocol:  Home health RN for IV administration and teaching; PICC line care and labs.    Labs weekly while on IV antibiotics: x__ CBC with differential _x_ BMP  lease leave PIC in place until doctor has seen patient or been notified  Fax weekly labs to [phone]   As before we are trying to have some restraint exercise in terms of his antibiotics.  I would prefer if possible that we are involved with deciding when and when not to treat with antibiotics and to try to withhold antibiotics from him whenever possible.  . Diffuse B-cell lymphoma: Follow with Dr. Irene Limbo   Falls likely multifactorial  Goals of care: would support continued work with pallaitive

## 2022-07-01 DIAGNOSIS — A419 Sepsis, unspecified organism: Secondary | ICD-10-CM | POA: Diagnosis not present

## 2022-07-01 DIAGNOSIS — N390 Urinary tract infection, site not specified: Secondary | ICD-10-CM | POA: Diagnosis not present

## 2022-07-01 NOTE — Telephone Encounter (Signed)
The following recommendations were sent via mychart to patient and spouse.    "As no hematuria, melena, falls - continue Xarelto '20mg'$  daily.  If he or his husband note bleeding complications, more frequent falls please have them contact us and we will continue to carefully monitor risk vs benefit of anticoagulation.    Loel Dubonnet, NP "

## 2022-07-05 DIAGNOSIS — N390 Urinary tract infection, site not specified: Secondary | ICD-10-CM | POA: Diagnosis not present

## 2022-07-06 DIAGNOSIS — J301 Allergic rhinitis due to pollen: Secondary | ICD-10-CM | POA: Diagnosis not present

## 2022-07-06 DIAGNOSIS — J3081 Allergic rhinitis due to animal (cat) (dog) hair and dander: Secondary | ICD-10-CM | POA: Diagnosis not present

## 2022-07-06 DIAGNOSIS — J3089 Other allergic rhinitis: Secondary | ICD-10-CM | POA: Diagnosis not present

## 2022-07-07 ENCOUNTER — Telehealth: Payer: Self-pay | Admitting: Internal Medicine

## 2022-07-07 NOTE — Telephone Encounter (Signed)
[name] needs a verbal order for occupational therapy 4 total visits for transfers and exercise and heart failure education

## 2022-07-09 DIAGNOSIS — Z9221 Personal history of antineoplastic chemotherapy: Secondary | ICD-10-CM | POA: Diagnosis not present

## 2022-07-09 DIAGNOSIS — C8336 Diffuse large B-cell lymphoma, intrapelvic lymph nodes: Secondary | ICD-10-CM | POA: Diagnosis not present

## 2022-07-09 DIAGNOSIS — C8338 Diffuse large B-cell lymphoma, lymph nodes of multiple sites: Secondary | ICD-10-CM | POA: Diagnosis not present

## 2022-07-09 DIAGNOSIS — C8330 Diffuse large B-cell lymphoma, unspecified site: Secondary | ICD-10-CM | POA: Diagnosis not present

## 2022-07-09 NOTE — Telephone Encounter (Signed)
Okay. Thank you.

## 2022-07-09 NOTE — Telephone Encounter (Signed)
Don with Occupational  Therapy needs someone to call and ok this - Thank you

## 2022-07-12 DIAGNOSIS — N390 Urinary tract infection, site not specified: Secondary | ICD-10-CM | POA: Diagnosis not present

## 2022-07-12 NOTE — Telephone Encounter (Signed)
Call Don no answer LMOM w/MD response.Marland KitchenJohny Chess

## 2022-07-13 DIAGNOSIS — J3089 Other allergic rhinitis: Secondary | ICD-10-CM | POA: Diagnosis not present

## 2022-07-13 DIAGNOSIS — J301 Allergic rhinitis due to pollen: Secondary | ICD-10-CM | POA: Diagnosis not present

## 2022-07-13 DIAGNOSIS — J3081 Allergic rhinitis due to animal (cat) (dog) hair and dander: Secondary | ICD-10-CM | POA: Diagnosis not present

## 2022-07-14 ENCOUNTER — Telehealth: Payer: Medicare PPO

## 2022-07-14 ENCOUNTER — Telehealth: Payer: Self-pay | Admitting: *Deleted

## 2022-07-14 NOTE — Telephone Encounter (Signed)
Want to call and report pt w/ low BP. Check it was 102/62 after moving around BP drop again to 88/56. Pt states he feels fine. After the visit he check BP again and it was 90/56. Inform Don and patient that MD was out of the office until Monday. If BP continue to drop he need to go to ER or make an ov w/ one of the covering provider for evaluation. Pt understood advise.Marland KitchenJohny Chess

## 2022-07-15 DIAGNOSIS — C8330 Diffuse large B-cell lymphoma, unspecified site: Secondary | ICD-10-CM | POA: Diagnosis not present

## 2022-07-15 DIAGNOSIS — Z9221 Personal history of antineoplastic chemotherapy: Secondary | ICD-10-CM | POA: Diagnosis not present

## 2022-07-15 DIAGNOSIS — C8336 Diffuse large B-cell lymphoma, intrapelvic lymph nodes: Secondary | ICD-10-CM | POA: Diagnosis not present

## 2022-07-15 DIAGNOSIS — Z510 Encounter for antineoplastic radiation therapy: Secondary | ICD-10-CM | POA: Diagnosis not present

## 2022-07-15 MED ORDER — DILTIAZEM HCL ER COATED BEADS 120 MG PO CP24: 120.0000 mg | ORAL_CAPSULE | Freq: Every day | ORAL | 5 refills | Status: DC

## 2022-07-15 NOTE — Telephone Encounter (Signed)
Notified pt w/MD response.../lmb 

## 2022-07-15 NOTE — Telephone Encounter (Signed)
Stop Cardizem CD 180. Start Cardizem CD 120.  Prescription was sent to  [facility].

## 2022-07-17 ENCOUNTER — Other Ambulatory Visit: Payer: Self-pay | Admitting: Internal Medicine

## 2022-07-19 ENCOUNTER — Telehealth: Payer: Self-pay | Admitting: Internal Medicine

## 2022-07-19 DIAGNOSIS — N390 Urinary tract infection, site not specified: Secondary | ICD-10-CM | POA: Diagnosis not present

## 2022-07-19 NOTE — Telephone Encounter (Signed)
[name] needs verbal orders for a urinalysis and that patient had a couple of falls over the weekend.  No injuries.

## 2022-07-20 ENCOUNTER — Emergency Department (HOSPITAL_COMMUNITY): Payer: Medicare PPO

## 2022-07-20 ENCOUNTER — Telehealth: Payer: Self-pay

## 2022-07-20 ENCOUNTER — Encounter (HOSPITAL_COMMUNITY): Payer: Self-pay

## 2022-07-20 ENCOUNTER — Inpatient Hospital Stay (HOSPITAL_COMMUNITY)
Admission: EM | Admit: 2022-07-20 | Discharge: 2022-07-23 | DRG: 312 | Disposition: A | Discharge status: Home Health | Payer: Medicare PPO | Attending: Internal Medicine | Admitting: Internal Medicine

## 2022-07-20 ENCOUNTER — Other Ambulatory Visit: Payer: Self-pay

## 2022-07-20 DIAGNOSIS — I130 Hypertensive heart and chronic kidney disease with heart failure and stage 1 through stage 4 chronic kidney disease, or unspecified chronic kidney disease: Secondary | ICD-10-CM | POA: Diagnosis present

## 2022-07-20 DIAGNOSIS — I5032 Chronic diastolic (congestive) heart failure: Secondary | ICD-10-CM | POA: Diagnosis present

## 2022-07-20 DIAGNOSIS — Z7901 Long term (current) use of anticoagulants: Secondary | ICD-10-CM | POA: Diagnosis not present

## 2022-07-20 DIAGNOSIS — Z961 Presence of intraocular lens: Secondary | ICD-10-CM | POA: Diagnosis present

## 2022-07-20 DIAGNOSIS — I4819 Other persistent atrial fibrillation: Secondary | ICD-10-CM | POA: Diagnosis present

## 2022-07-20 DIAGNOSIS — R2681 Unsteadiness on feet: Secondary | ICD-10-CM | POA: Diagnosis present

## 2022-07-20 DIAGNOSIS — Z825 Family history of asthma and other chronic lower respiratory diseases: Secondary | ICD-10-CM

## 2022-07-20 DIAGNOSIS — F411 Generalized anxiety disorder: Secondary | ICD-10-CM | POA: Diagnosis present

## 2022-07-20 DIAGNOSIS — R296 Repeated falls: Secondary | ICD-10-CM

## 2022-07-20 DIAGNOSIS — I5022 Chronic systolic (congestive) heart failure: Secondary | ICD-10-CM | POA: Diagnosis present

## 2022-07-20 DIAGNOSIS — N401 Enlarged prostate with lower urinary tract symptoms: Secondary | ICD-10-CM | POA: Diagnosis present

## 2022-07-20 DIAGNOSIS — W19XXXA Unspecified fall, initial encounter: Secondary | ICD-10-CM

## 2022-07-20 DIAGNOSIS — G319 Degenerative disease of nervous system, unspecified: Secondary | ICD-10-CM | POA: Diagnosis not present

## 2022-07-20 DIAGNOSIS — M810 Age-related osteoporosis without current pathological fracture: Secondary | ICD-10-CM | POA: Diagnosis present

## 2022-07-20 DIAGNOSIS — N1831 Chronic kidney disease, stage 3a: Secondary | ICD-10-CM | POA: Diagnosis present

## 2022-07-20 DIAGNOSIS — G8929 Other chronic pain: Secondary | ICD-10-CM | POA: Diagnosis present

## 2022-07-20 DIAGNOSIS — I2510 Atherosclerotic heart disease of native coronary artery without angina pectoris: Secondary | ICD-10-CM | POA: Diagnosis present

## 2022-07-20 DIAGNOSIS — Z974 Presence of external hearing-aid: Secondary | ICD-10-CM

## 2022-07-20 DIAGNOSIS — N138 Other obstructive and reflux uropathy: Secondary | ICD-10-CM | POA: Diagnosis present

## 2022-07-20 DIAGNOSIS — E890 Postprocedural hypothyroidism: Secondary | ICD-10-CM | POA: Diagnosis present

## 2022-07-20 DIAGNOSIS — I951 Orthostatic hypotension: Principal | ICD-10-CM | POA: Diagnosis present

## 2022-07-20 DIAGNOSIS — Z66 Do not resuscitate: Secondary | ICD-10-CM | POA: Diagnosis present

## 2022-07-20 DIAGNOSIS — Z6830 Body mass index (BMI) 30.0-30.9, adult: Secondary | ICD-10-CM | POA: Diagnosis not present

## 2022-07-20 DIAGNOSIS — C8330 Diffuse large B-cell lymphoma, unspecified site: Secondary | ICD-10-CM | POA: Diagnosis present

## 2022-07-20 DIAGNOSIS — Z79890 Hormone replacement therapy (postmenopausal): Secondary | ICD-10-CM

## 2022-07-20 DIAGNOSIS — C7931 Secondary malignant neoplasm of brain: Secondary | ICD-10-CM | POA: Diagnosis not present

## 2022-07-20 DIAGNOSIS — E440 Moderate protein-calorie malnutrition: Secondary | ICD-10-CM | POA: Insufficient documentation

## 2022-07-20 DIAGNOSIS — Z87440 Personal history of urinary (tract) infections: Secondary | ICD-10-CM

## 2022-07-20 DIAGNOSIS — Z8261 Family history of arthritis: Secondary | ICD-10-CM

## 2022-07-20 DIAGNOSIS — Z87891 Personal history of nicotine dependence: Secondary | ICD-10-CM

## 2022-07-20 DIAGNOSIS — G4733 Obstructive sleep apnea (adult) (pediatric): Secondary | ICD-10-CM | POA: Diagnosis present

## 2022-07-20 DIAGNOSIS — Z79899 Other long term (current) drug therapy: Secondary | ICD-10-CM | POA: Diagnosis not present

## 2022-07-20 DIAGNOSIS — Z96643 Presence of artificial hip joint, bilateral: Secondary | ICD-10-CM | POA: Diagnosis present

## 2022-07-20 DIAGNOSIS — Z87442 Personal history of urinary calculi: Secondary | ICD-10-CM

## 2022-07-20 DIAGNOSIS — J3089 Other allergic rhinitis: Secondary | ICD-10-CM | POA: Diagnosis not present

## 2022-07-20 DIAGNOSIS — R4781 Slurred speech: Secondary | ICD-10-CM | POA: Diagnosis present

## 2022-07-20 DIAGNOSIS — Z811 Family history of alcohol abuse and dependence: Secondary | ICD-10-CM

## 2022-07-20 DIAGNOSIS — M40209 Unspecified kyphosis, site unspecified: Secondary | ICD-10-CM | POA: Diagnosis not present

## 2022-07-20 DIAGNOSIS — I6782 Cerebral ischemia: Secondary | ICD-10-CM | POA: Diagnosis not present

## 2022-07-20 DIAGNOSIS — F39 Unspecified mood [affective] disorder: Secondary | ICD-10-CM | POA: Diagnosis present

## 2022-07-20 DIAGNOSIS — J3081 Allergic rhinitis due to animal (cat) (dog) hair and dander: Secondary | ICD-10-CM | POA: Diagnosis not present

## 2022-07-20 DIAGNOSIS — Z9221 Personal history of antineoplastic chemotherapy: Secondary | ICD-10-CM

## 2022-07-20 DIAGNOSIS — K429 Umbilical hernia without obstruction or gangrene: Secondary | ICD-10-CM | POA: Diagnosis not present

## 2022-07-20 DIAGNOSIS — Z9181 History of falling: Secondary | ICD-10-CM

## 2022-07-20 DIAGNOSIS — E785 Hyperlipidemia, unspecified: Secondary | ICD-10-CM | POA: Diagnosis present

## 2022-07-20 DIAGNOSIS — E860 Dehydration: Secondary | ICD-10-CM | POA: Diagnosis present

## 2022-07-20 DIAGNOSIS — L409 Psoriasis, unspecified: Secondary | ICD-10-CM | POA: Diagnosis present

## 2022-07-20 DIAGNOSIS — R000 Tachycardia, unspecified: Secondary | ICD-10-CM | POA: Diagnosis not present

## 2022-07-20 DIAGNOSIS — Z9842 Cataract extraction status, left eye: Secondary | ICD-10-CM

## 2022-07-20 DIAGNOSIS — J301 Allergic rhinitis due to pollen: Secondary | ICD-10-CM | POA: Diagnosis not present

## 2022-07-20 DIAGNOSIS — M4856XA Collapsed vertebra, not elsewhere classified, lumbar region, initial encounter for fracture: Secondary | ICD-10-CM | POA: Diagnosis not present

## 2022-07-20 DIAGNOSIS — Z515 Encounter for palliative care: Secondary | ICD-10-CM

## 2022-07-20 DIAGNOSIS — Z9841 Cataract extraction status, right eye: Secondary | ICD-10-CM

## 2022-07-20 DIAGNOSIS — Z8249 Family history of ischemic heart disease and other diseases of the circulatory system: Secondary | ICD-10-CM

## 2022-07-20 DIAGNOSIS — S0990XA Unspecified injury of head, initial encounter: Secondary | ICD-10-CM | POA: Diagnosis not present

## 2022-07-20 DIAGNOSIS — I517 Cardiomegaly: Secondary | ICD-10-CM | POA: Diagnosis not present

## 2022-07-20 DIAGNOSIS — Z7189 Other specified counseling: Secondary | ICD-10-CM | POA: Diagnosis not present

## 2022-07-20 DIAGNOSIS — C8338 Diffuse large B-cell lymphoma, lymph nodes of multiple sites: Secondary | ICD-10-CM | POA: Diagnosis not present

## 2022-07-20 DIAGNOSIS — E039 Hypothyroidism, unspecified: Secondary | ICD-10-CM | POA: Diagnosis present

## 2022-07-20 DIAGNOSIS — R42 Dizziness and giddiness: Secondary | ICD-10-CM | POA: Diagnosis not present

## 2022-07-20 DIAGNOSIS — Z043 Encounter for examination and observation following other accident: Secondary | ICD-10-CM | POA: Diagnosis not present

## 2022-07-20 DIAGNOSIS — I480 Paroxysmal atrial fibrillation: Secondary | ICD-10-CM | POA: Diagnosis present

## 2022-07-20 DIAGNOSIS — N200 Calculus of kidney: Secondary | ICD-10-CM | POA: Diagnosis not present

## 2022-07-20 LAB — I-STAT CHEM 8, ED
BUN: 28 mg/dL — ABNORMAL HIGH (ref 8–23)
Calcium, Ion: 1.31 mmol/L (ref 1.15–1.40)
Chloride: 99 mmol/L (ref 98–111)
Creatinine, Ser: 1.4 mg/dL — ABNORMAL HIGH (ref 0.61–1.24)
Glucose, Bld: 113 mg/dL — ABNORMAL HIGH (ref 70–99)
HCT: 39 % (ref 39.0–52.0)
Hemoglobin: 13.3 g/dL (ref 13.0–17.0)
Potassium: 3.9 mmol/L (ref 3.5–5.1)
Sodium: 137 mmol/L (ref 135–145)
TCO2: 27 mmol/L (ref 22–32)

## 2022-07-20 NOTE — ED Notes (Signed)
Trauma Response Nurse Documentation   [name] is a 76 y.o. male arriving to [facility] via [facility]  On Xarelto (rivaroxaban) daily. Trauma was activated as a Level 2 by Kem Parkinson based on the following trauma criteria Elderly patients > 65 with head trauma on anti-coagulation (excluding ASA). Trauma team at the bedside on patient arrival.   Patient cleared for CT by Dr. Ralene Bathe. Pt transported to CT with trauma response nurse present to monitor. RN remained with the patient throughout their absence from the department for clinical observation.   GCS 15.  History   Past Medical History:  Diagnosis Date   Anticoagulant long-term use    xarelto--- takes for AFib,  managed by pcp, Dr Alain Marion   Benign localized prostatic hyperplasia with lower urinary tract symptoms (LUTS)    CAD (coronary artery disease) cardiologist--  dr t. Oval Linsey   hx of known CAD obstruction w/ collaterals (cath done @ [facility] 12-31-2011) ;   last cardiac cath 08-13-2019  showed sig. 2V CAD involving proxLAD and CTO of the RCA/  chronic total occlusion midRCA w/ bridging and L>R collaterals   Diastolic CHF ([place])    dx 26-20-3559 hospital admission (followed by pcp)   Diffuse large B cell lymphoma [facility]) oncologist-- dr Irene Limbo--- in remission   dx 08/ 2019 -- bx left tonsill mass-- involving lymph nodes--- completed chemotherapy 10-12-2018   Diffuse large B cell lymphoma ([place]) 05/24/2022   DJD (degenerative joint disease)    Dyspnea    Dysthymic disorder    Elevated brain natriuretic peptide (BNP) level    Environmental allergies    GAD (generalized anxiety disorder)    GERD (gastroesophageal reflux disease)    History of falling    multiple times   History of Graves' disease    1987 s/p total thyroidectomy   History of kidney stones    History of scabies    07/ 2014  resolved   History of syncope    per D/C note in epic 02-21-2013 ?vasovagal   History of vertebral compression  fracture    05/ 2014 --  L1, no surgical intervention   HLD (hyperlipidemia)    Hyperlipidemia    Hypertension    Hypogonadism in male    IDA (iron deficiency anemia)    Lumbago    OA (osteoarthritis)    OSA on CPAP    cpap machine-settings 17   Osteomyelitis of right wrist ([place]) 11/02/2012   Osteoporosis    Severe   Osteoporosis with fracture 04/24/2013   Pathological fracture due to osteoporosis with delayed healing 04/24/2013   Persistent atrial fibrillation [facility])    cardiologist--- dr Alfonso Patten. [place]   Plaque psoriasis    dermatologist--- dr Harriett Sine--- currently taking otezla   Post-surgical hypothyroidism    followed by pcp---  s/p total thyroidectomy for graves disease in 1987   Pure hypercholesterolemia 02/10/2021   Recurrent UTI 06/02/2022   Renal calculus, right    Right femoral fracture ([place]) 09/29/2012   Right forearm fracture 09/29/2012   Right tibial fracture 02/05/2013   Thrombocytopenia (H[facility])    Unsteady gait    uses walker   Wears hearing aid in both ears      Past Surgical History:  Procedure Laterality Date   APPENDECTOMY  2011 approx.    CARDIAC CATHETERIZATION  12-31-2011   dr Lenna Sciara. Beatrix Fetters '@[facility]'$    normal LVF w/ multivessel CAD,  occluded RCA w/ colleterals   CATARACT EXTRACTION W/ INTRAOCULAR  LENS  IMPLANT, BILATERAL  2016 approx.   COLONOSCOPY     CYSTOSCOPY WITH RETROGRADE PYELOGRAM, URETEROSCOPY AND STENT PLACEMENT Left 11/07/2017   Procedure: CYSTOSCOPY WITH RETROGRADE PYELOGRAM, URETEROSCOPY AND STENT PLACEMENT;  Surgeon: Cleon Gustin, MD;  Location: [facility];  Service: Urology;  Laterality: Left;   CYSTOSCOPY WITH RETROGRADE PYELOGRAM, URETEROSCOPY AND STENT PLACEMENT Right 10/22/2019   Procedure: CYSTOSCOPY WITH RETROGRADE PYELOGRAM, URETEROSCOPY AND STENT PLACEMENT;  Surgeon: Cleon Gustin, MD;  Location: [facility];  Service: Urology;  Laterality: Right;   CYSTOSCOPY/URETEROSCOPY/HOLMIUM LASER/STENT PLACEMENT Right  03/11/2017   Procedure: CYSTOSCOPY/URETEROSCOPYSTENT PLACEMENT right ureter retrograde pylegram;  Surgeon: Cleon Gustin, MD;  [facility];  Service: Urology;  Laterality: Right;   HARDWARE REMOVAL  10/05/2012   Procedure: HARDWARE REMOVAL;  Surgeon: [name], MD;  [facility];  Service: Orthopedics;  Laterality: Right;  REMOVING  STRYKER  GAMMA NAIL   HARDWARE REMOVAL Right 07/03/2013   Procedure: HARDWARE REMOVAL RIGHT TIBIA ;  Surgeon: Rozanna Box, MD;  Location: [place];  Service: Orthopedics;  Laterality: Right;   HIP CLOSED REDUCTION Right 10/15/2013   Procedure: CLOSED MANIPULATION HIP;  Surgeon: [name], MD;  [facility];  Service: Orthopedics;  Laterality: Right;   HOLMIUM LASER APPLICATION Right 05/12/9561   Procedure: HOLMIUM LASER APPLICATION;  Surgeon: Cleon Gustin, MD;  [facility];  Service: Urology;  Laterality: Right;   HOLMIUM LASER APPLICATION Left 11/10/8655   Procedure: HOLMIUM LASER APPLICATION;  Surgeon: Cleon Gustin, MD;  [facility];  Service: Urology;  Laterality: Left;   HOLMIUM LASER APPLICATION Left 05/14/6961   Procedure: HOLMIUM LASER APPLICATION;  Surgeon: Cleon Gustin, MD;  [facility];  Service: Urology;  Laterality: Left;   HOLMIUM LASER APPLICATION Right 9/52/8413   Procedure: HOLMIUM LASER APPLICATION;  Surgeon: Cleon Gustin, MD;  Location: [facility];  Service: Urology;  Laterality: Right;   INCISION AND DRAINAGE HIP Right 11/16/2013   Procedure: IRRIGATION AND DEBRIDEMENT RIGHT HIP;  Surgeon: [name], MD;  [facility];  Service: Orthopedics;  Laterality: Right;   INTRAVASCULAR PRESSURE WIRE/FFR STUDY N/A 08/13/2019   Procedure: INTRAVASCULAR PRESSURE WIRE/FFR STUDY;  Surgeon: Nelva Bush, MD;  Location: [place] [facility];  Service: Cardiovascular;  Laterality: N/A;   IR IMAGING GUIDED PORT INSERTION  06/22/2018   IR NEPHROSTOMY PLACEMENT LEFT  03/28/2019   IR  URETERAL STENT LEFT NEW ACCESS W/O SEP NEPHROSTOMY CATH  10/24/2017   IR URETERAL STENT LEFT NEW ACCESS W/O SEP NEPHROSTOMY CATH  03/26/2019   NEPHROLITHOTOMY Right 02/08/2017   Procedure: NEPHROLITHOTOMY PERCUTANEOUS WITH SURGEON ACCESS;  Surgeon: Cleon Gustin, MD;  [facility];  Service: Urology;  Laterality: Right;   NEPHROLITHOTOMY Left 10/24/2017   Procedure: NEPHROLITHOTOMY PERCUTANEOUS;  Surgeon: Cleon Gustin, MD;  [facility];  Service: Urology;  Laterality: Left;   NEPHROLITHOTOMY Left 03/26/2019   Procedure: NEPHROLITHOTOMY PERCUTANEOUS;  Surgeon: Cleon Gustin, MD;  [facility];  Service: Urology;  Laterality: Left;  2 HRS   NEPHROLITHOTOMY Left 04/17/2019   Procedure: NEPHROLITHOTOMY PERCUTANEOUS;  Surgeon: Cleon Gustin, MD;  [facility];  Service: Urology;  Laterality: Left;  2 HRS   ORIF TIBIA FRACTURE Right 02/06/2013   Procedure: OPEN REDUCTION INTERNAL FIXATION (ORIF) TIBIA FRACTURE WITH IM ROD FIBULA;  Surgeon: Rozanna Box, MD;  Location: [place];  Service: Orthopedics;  Laterality: Right;   ORIF TIBIA  FRACTURE Right 07/03/2013   Procedure: RIGHT TIBIA NON UNION REPAIR ;  Surgeon: Rozanna Box, MD;  Location: [place];  Service: Orthopedics;  Laterality: Right;   ORIF WRIST FRACTURE  10/02/2012   Procedure: OPEN REDUCTION INTERNAL FIXATION (ORIF) WRIST FRACTURE;  Surgeon: Roseanne Kaufman, MD;  [facility];  Service: Orthopedics;  Laterality: Right;  WITH   ANTIBIOTIC  CEMENT   ORIF WRIST FRACTURE Left 10/28/2013   Procedure: OPEN REDUCTION INTERNAL FIXATION (ORIF) WRIST FRACTURE with allograft;  Surgeon: Roseanne Kaufman, MD;  [facility];  Service: Orthopedics;  Laterality: Left;  DVR Plate   QUADRICEPS TENDON REPAIR Left 07/15/2017   Procedure: REPAIR QUADRICEP TENDON;  Surgeon: Frederik Pear, MD;  Location: [place];  Service: Orthopedics;  Laterality: Left;   RIGHT/LEFT HEART CATH AND CORONARY ANGIOGRAPHY N/A 08/13/2019   Procedure:  RIGHT/LEFT HEART CATH AND CORONARY ANGIOGRAPHY;  Surgeon: Nelva Bush, MD;  Location: [place] [facility];  Service: Cardiovascular;  Laterality: N/A;   THYROIDECTOMY  02/1986   TOTAL HIP ARTHROPLASTY Right 03-16-2016   '@[facility]'$    TOTAL HIP REVISION  10/05/2012   Procedure: TOTAL HIP REVISION;  Surgeon: [name], MD;  [facility];  Service: Orthopedics;  Laterality: Right;  RIGHT TOTAL HIP REVISION   TOTAL HIP REVISION Right 09/17/2013   Procedure: REVISION RIGHT TOTAL HIP ARTHROPLASTY ;  Surgeon: [name], MD;  [facility];  Service: Orthopedics;  Laterality: Right;   TOTAL HIP REVISION Right 10/26/2013   Procedure: REVISION RIGHT TOTAL HIP ARTHROPLASTY;  Surgeon: [name], MD;  [facility];  Service: Orthopedics;  Laterality: Right;   TOTAL KNEE ARTHROPLASTY Bilateral right 03-15-2011;  left 06-30-2011   TOTAL KNEE REVISION Left 04/11/2017   Procedure: TOTAL KNEE REVISION PATELLA and TIBIA;  Surgeon: Frederik Pear, MD;  Location: [place];  Service: Orthopedics;  Laterality: Left;       Initial Focused Assessment (If applicable, or please see trauma documentation): Airway-- intact Breathing-- spontaneous, unlabored Circulation-- no apparent blood noted  CT's Completed:   CT Head   Interventions:  See event note  Plan for disposition:  Unknown at this time.  Consults completed:  none at 2352.  Event Summary: Patient brought in by [facility], patient at unwitnessed fall this evening, states he has balance issues and a history of falls. Unknown if patient hit his head. Upon arrival patient sitting up, alert and oriented x4, GCS 15. No complaints of pain. Trauma labs obtained, xray chest completed. CT head completed.   Bedside handoff with ED RN Danae Chen.    Trudee Kuster  Trauma Response RN  Please call TRN at [phone] for further assistance.

## 2022-07-20 NOTE — ED Provider Notes (Signed)
[facility] EMERGENCY DEPARTMENT Provider Note   CSN: [id number] Arrival date & time: 07/20/22  2322     History {Add pertinent medical, surgical, social history, OB history to HPI:1} No chief complaint on file.   CLAXTON LEVITZ is a 76 y.o. male.  The history is provided by the patient, the EMS personnel and medical records.  LAYTEN AIKEN is a 76 y.o. male who presents to the Emergency Department complaining of fall.  He presents to the emergency department as a level 2 trauma alert following fall.  He has a history of unsteadiness at baseline, B-cell lymphoma, recurrent UTI.  He has been experiencing increased falls over the last 2 weeks and has had multiple falls today.  He just completed antibiotics for a urinary tract infection.  Patient does not recall hitting his head but his partner heard him hit his head when he fell.  Patient denies any pain.  No chest pain, headache, abdominal pain, shortness of breath.  He does report feeling unsteady.  No nausea, vomiting, diarrhea, dysuria.  He is scheduled to initiate chemotherapy tomorrow.     Home Medications Prior to Admission medications   Medication Sig Start Date End Date Taking? Authorizing Provider  acetaminophen (TYLENOL) 325 MG tablet Take 2 tablets (650 mg total) by mouth every 6 (six) hours as needed for mild pain (or Fever >/= 101). 03/25/22   Raiford Noble Latif, DO  albuterol (VENTOLIN HFA) 108 (90 Base) MCG/ACT inhaler Inhale 2 puffs into the lungs every 6 (six) hours as needed for wheezing or shortness of breath.    [provider]  alfuzosin (UROXATRAL) 10 MG 24 hr tablet TAKE 1 TABLET AT BEDTIME Patient not taking: Reported on 06/11/2022 03/19/21   Cleon Gustin, MD  allopurinol (ZYLOPRIM) 100 MG tablet Take 1 tablet (100 mg total) by mouth 2 (two) times daily. 06/30/22   Truman Hayward, MD  amoxicillin-clavulanate (AUGMENTIN) 875-125 MG tablet Take 1 tablet by mouth 2 (two) times  daily. Patient not taking: Reported on 06/11/2022 05/20/22   Plotnikov, Evie Lacks, MD  atorvastatin (LIPITOR) 80 MG tablet Take 80 mg by mouth at bedtime.    [provider]  buPROPion (WELLBUTRIN XL) 300 MG 24 hr tablet TAKE 1 TABLET EVERY DAY WITH BREAKFAST 07/19/22   Plotnikov, Evie Lacks, MD  Calcium Citrate-Vitamin D (CALCIUM CITRATE + D PO) Take 1 tablet by mouth 2 (two) times daily. '1200mg'$  of Calcium and 1000 units vitamin D3    [provider]  clonazePAM (KLONOPIN) 2 MG tablet Take 0.5 tablets (1 mg total) by mouth 3 (three) times daily as needed for anxiety. 06/19/22   Plotnikov, Evie Lacks, MD  CRANBERRY PO Take 2 tablets by mouth every morning.    [provider]  denosumab (PROLIA) 60 MG/ML SOSY injection Inject 60 mg as directed every 6 (six) months. 12/10/15   [provider]  dexamethasone (DECADRON) 4 MG tablet Take 5 tablets (20 mg total) by mouth daily. Start the day after carboplatin chemotherapy for 3 days. Patient not taking: Reported on 06/02/2022 03/19/22   Brunetta Genera, MD  diltiazem (CARDIZEM CD) 120 MG 24 hr capsule Take 1 capsule (120 mg total) by mouth daily. 07/15/22   Plotnikov, Evie Lacks, MD  Docusate Sodium 100 MG capsule Take 100 mg by mouth 2 (two) times daily. Patient not taking: Reported on 06/30/2022    [provider]  DULoxetine (CYMBALTA) 60 MG capsule TAKE 1 CAPSULE TWICE  DAILY 07/19/22   Plotnikov, Evie Lacks, MD  EPINEPHrine 0.3 mg/0.3 mL IJ SOAJ injection Inject 0.3 mg into the muscle once as needed for anaphylaxis (severe allergic reaction). Patient not taking: Reported on 06/02/2022 07/31/21   [provider]  ezetimibe (ZETIA) 10 MG tablet TAKE 1 TABLET EVERY DAY Patient not taking: Reported on 06/11/2022 08/10/21   Skeet Latch, MD  feeding supplement (ENSURE ENLIVE / ENSURE PLUS) LIQD Take 237 mLs by mouth 2 (two) times daily between meals. Patient taking differently: Take 237 mLs by mouth daily.  03/25/22   Raiford Noble Latif, DO  HYDROcodone-acetaminophen (NORCO) 7.5-325 MG tablet Take 1 tablet by mouth every 6 (six) hours as needed for severe pain. 03/26/22   Raiford Noble Latif, DO  levothyroxine (SYNTHROID) 100 MCG tablet Take 1 tablet (100 mcg total) by mouth daily before breakfast. 05/24/22   Plotnikov, Evie Lacks, MD  lidocaine-prilocaine (EMLA) cream Apply 1 Application topically as needed. Patient taking differently: Apply 1 Application topically as needed (For port access/chemotherapy). 04/21/22   Brunetta Genera, MD  loratadine (CLARITIN) 10 MG tablet Take 10 mg by mouth every morning. Patient not taking: Reported on 06/30/2022    [provider]  Magnesium 500 MG CAPS Take 1 capsule (500 mg total) by mouth daily. Patient taking differently: Take 500 mg by mouth daily with supper. 10/13/18   Brunetta Genera, MD  Menthol, Topical Analgesic, (BIOFREEZE) 10 % CREA Apply 1 Application topically daily as needed (Knee pain).    [provider]  metoprolol tartrate (LOPRESSOR) 25 MG tablet Take 1 tablet (25 mg total) by mouth 2 (two) times daily. Patient not taking: Reported on 06/11/2022 05/17/22   Loel Dubonnet, NP  Multiple Vitamin (MULTIVITAMIN WITH MINERALS) TABS tablet Take 1 tablet by mouth every morning. Men's One-A-Day 50+    [provider]  MYRBETRIQ 25 MG TB24 tablet TAKE 1 TABLET EVERY DAY Patient taking differently: Take 25 mg by mouth at bedtime. 10/06/21   McKenzie, Candee Furbish, MD  nitroGLYCERIN (NITROSTAT) 0.4 MG SL tablet Place 1 tablet (0.4 mg total) under the tongue every 5 (five) minutes as needed for chest pain (max 3 doses). Patient not taking: Reported on 06/02/2022 05/17/22   Loel Dubonnet, NP  omeprazole (PRILOSEC) 40 MG capsule TAKE 1 CAPSULE EVERY DAY BEFORE BREAKFAST 07/19/22   Plotnikov, Evie Lacks, MD  ondansetron (ZOFRAN) 8 MG tablet Take 1 tablet (8 mg total) by mouth 2 (two) times daily as needed. Start on the 3rd day after  cisplatin chemotherapy. Patient not taking: Reported on 06/02/2022 03/19/22   Brunetta Genera, MD  Potassium Citrate 15 MEQ (1620 MG) TBCR Take 1 tablet by mouth See admin instructions. Take one tablet (15 meq) by mouth twice daily - with lunch and supper Patient taking differently: Take 1 tablet by mouth in the morning and at bedtime. 06/10/22   McKenzie, Candee Furbish, MD  Probiotic Product (PROBIOTIC PO) Take 1 capsule by mouth daily with breakfast.    [provider]  prochlorperazine (COMPAZINE) 10 MG tablet Take 1 tablet (10 mg total) by mouth every 6 (six) hours as needed (Nausea or vomiting). Patient not taking: Reported on 06/11/2022 03/19/22   Brunetta Genera, MD  rivaroxaban (XARELTO) 20 MG TABS tablet Take 1 tablet (20 mg total) by mouth daily with supper. 05/26/22   Loel Dubonnet, NP  testosterone enanthate (DELATESTRYL) 200 MG/ML injection INJECT 1ML ('200MG'$ ) INTO THE MUSCLE EVERY 14 DAYS. DISCARD VIAL AFTER 28  DAYS. Patient taking differently: Inject 200 mg into the muscle every 14 (fourteen) days. Every other Friday 09/22/21   Plotnikov, Evie Lacks, MD  vitamin C (ASCORBIC ACID) 500 MG tablet Take 500 mg by mouth daily with lunch.    [provider]      Allergies    Short ragweed pollen ext    Review of Systems   Review of Systems  All other systems reviewed and are negative.   Physical Exam Updated Vital Signs BP (!) 96/50   Pulse 90   Temp 97.9 F (36.6 C) (Oral)   Resp 19   Ht '6\' 4"'$  (1.93 m)   Wt 114.3 kg   SpO2 97%   BMI 30.67 kg/m  Physical Exam Vitals and nursing note reviewed.  Constitutional:      Appearance: He is well-developed.  HENT:     Head: Normocephalic and atraumatic.  Cardiovascular:     Rate and Rhythm: Normal rate and regular rhythm.     Heart sounds: No murmur heard. Pulmonary:     Effort: Pulmonary effort is normal. No respiratory distress.     Breath sounds: Normal breath sounds.  Abdominal:     Palpations: Abdomen  is soft.     Tenderness: There is no abdominal tenderness. There is no guarding or rebound.  Musculoskeletal:        General: No tenderness.     Comments: Trace edema to BLE  Skin:    General: Skin is warm and dry.  Neurological:     Mental Status: He is alert and oriented to person, place, and time.     Comments: 5/5 strength in all four extremities  Psychiatric:        Behavior: Behavior normal.     ED Results / Procedures / Treatments   Labs (all labs ordered are listed, but only abnormal results are displayed) Labs Reviewed  URINE CULTURE  URINALYSIS, ROUTINE W REFLEX MICROSCOPIC  CBC WITH DIFFERENTIAL/PLATELET  COMPREHENSIVE METABOLIC PANEL  LACTIC ACID, PLASMA  LACTIC ACID, PLASMA  I-STAT CHEM 8, ED  CBG MONITORING, ED    EKG None  Radiology DG Chest Port 1 View  Result Date: 07/20/2022 CLINICAL DATA:  Fall, chronic anticoagulation EXAM: PORTABLE CHEST 1 VIEW COMPARISON:  06/17/2022 FINDINGS: Lungs are clear. No pneumothorax or pleural effusion. Cardiac size is mildly enlarged, unchanged. Right internal jugular chest port tip noted within the superior vena cava. Pulmonary vascularity is normal. No acute bone abnormality. IMPRESSION: Stable cardiomegaly. Electronically Signed   By: Fidela Salisbury M.D.   On: 07/20/2022 23:37    Procedures Procedures  {Document cardiac monitor, telemetry assessment procedure when appropriate:1}  Medications Ordered in ED Medications - No data to display  ED Course/ Medical Decision Making/ A&P                           Medical Decision Making Amount and/or Complexity of Data Reviewed Labs: ordered. Radiology: ordered.   ***  {Document critical care time when appropriate:1} {Document review of labs and clinical decision tools ie heart score, Chads2Vasc2 etc:1}  {Document your independent review of radiology images, and any outside records:1} {Document your discussion with family members, caretakers, and with  consultants:1} {Document social determinants of health affecting pt's care:1} {Document your decision making why or why not admission, treatments were needed:1} Final Clinical Impression(s) / ED Diagnoses Final diagnoses:  None    Rx / DC Orders ED Discharge Orders  None       

## 2022-07-20 NOTE — ED Triage Notes (Signed)
Pt BIB EMS from home. Pt had several falls today. Last fell about an hour ago and hit head on wall. Pt on Xarelto. Pt has no complaints. Pt has balance and orthostatic issues.  VS with EMS  104/60 HR 82 RR 16 100% RA  CBG 135 250 bolus en route

## 2022-07-20 NOTE — ED Notes (Signed)
Patient transported to CT with Ryan TRN 

## 2022-07-20 NOTE — Telephone Encounter (Signed)
Spoke with April ([phone]), gave verbal order per Dr. Tommy Medal to obtain urine culture as well.   Beryle Flock, RN

## 2022-07-20 NOTE — ED Notes (Signed)
Fluid bolus started by [employer]

## 2022-07-20 NOTE — Telephone Encounter (Signed)
Received call from [name], home health nurse with [facility], requesting verbal order for urinalysis. Reports patient is having burning on urination and that urine is dark and cloudy.   Verbal order given per Dr. Tommy Medal.   Beryle Flock, RN

## 2022-07-21 ENCOUNTER — Encounter (HOSPITAL_COMMUNITY): Payer: Self-pay | Admitting: Internal Medicine

## 2022-07-21 ENCOUNTER — Emergency Department (HOSPITAL_COMMUNITY): Payer: Medicare PPO

## 2022-07-21 DIAGNOSIS — R296 Repeated falls: Secondary | ICD-10-CM

## 2022-07-21 LAB — URINALYSIS, ROUTINE W REFLEX MICROSCOPIC
Bilirubin Urine: NEGATIVE
Glucose, UA: NEGATIVE mg/dL
Ketones, ur: NEGATIVE mg/dL
Nitrite: NEGATIVE
Protein, ur: 100 mg/dL — AB
RBC / HPF: >50 RBC/hpf — ABNORMAL HIGH (ref 0–5)
Specific Gravity, Urine: 1.018 (ref 1.005–1.030)
pH: 7 (ref 5.0–8.0)

## 2022-07-21 LAB — CBC WITH DIFFERENTIAL/PLATELET
Abs Immature Granulocytes: 0.03 10*3/uL (ref 0.00–0.07)
Basophils Absolute: 0 10*3/uL (ref 0.0–0.1)
Basophils Relative: 1 %
Eosinophils Absolute: 0.1 10*3/uL (ref 0.0–0.5)
Eosinophils Relative: 2 %
HCT: 36.8 % — ABNORMAL LOW (ref 39.0–52.0)
Hemoglobin: 12.2 g/dL — ABNORMAL LOW (ref 13.0–17.0)
Immature Granulocytes: 1 %
Lymphocytes Relative: 18 %
Lymphs Abs: 1.2 10*3/uL (ref 0.7–4.0)
MCH: 32.5 pg (ref 26.0–34.0)
MCHC: 33.2 g/dL (ref 30.0–36.0)
MCV: 98.1 fL (ref 80.0–100.0)
Monocytes Absolute: 0.6 10*3/uL (ref 0.1–1.0)
Monocytes Relative: 10 %
Neutro Abs: 4.5 10*3/uL (ref 1.7–7.7)
Neutrophils Relative %: 68 %
Platelets: 197 10*3/uL (ref 150–400)
RBC: 3.75 MIL/uL — ABNORMAL LOW (ref 4.22–5.81)
RDW: 14.8 % (ref 11.5–15.5)
WBC: 6.5 10*3/uL (ref 4.0–10.5)
nRBC: 0 % (ref 0.0–0.2)

## 2022-07-21 LAB — COMPREHENSIVE METABOLIC PANEL
ALT: 15 U/L (ref 0–44)
AST: 23 U/L (ref 15–41)
Albumin: 2.5 g/dL — ABNORMAL LOW (ref 3.5–5.0)
Alkaline Phosphatase: 83 U/L (ref 38–126)
Anion gap: 9 (ref 5–15)
BUN: 29 mg/dL — ABNORMAL HIGH (ref 8–23)
CO2: 26 mmol/L (ref 22–32)
Calcium: 9.8 mg/dL (ref 8.9–10.3)
Chloride: 99 mmol/L (ref 98–111)
Creatinine, Ser: 1.42 mg/dL — ABNORMAL HIGH (ref 0.61–1.24)
GFR, Estimated: 51 mL/min — ABNORMAL LOW (ref >60)
Glucose, Bld: 114 mg/dL — ABNORMAL HIGH (ref 70–99)
Potassium: 4.1 mmol/L (ref 3.5–5.1)
Sodium: 134 mmol/L — ABNORMAL LOW (ref 135–145)
Total Bilirubin: 0.8 mg/dL (ref 0.3–1.2)
Total Protein: 5.2 g/dL — ABNORMAL LOW (ref 6.5–8.1)

## 2022-07-21 LAB — CBG MONITORING, ED: Glucose-Capillary: 107 mg/dL — ABNORMAL HIGH (ref 70–99)

## 2022-07-21 LAB — LACTIC ACID, PLASMA: Lactic Acid, Venous: 1.2 mmol/L (ref 0.5–1.9)

## 2022-07-21 MED ORDER — DULOXETINE HCL 60 MG PO CPEP
60.0000 mg | ORAL_CAPSULE | Freq: Two times a day (BID) | ORAL | Status: DC
  Administered 2022-07-21 – 2022-07-23 (×5): 60 mg via ORAL
  Filled 2022-07-21 (×2): qty 1
  Filled 2022-07-21 (×2): qty 2
  Filled 2022-07-21: qty 1

## 2022-07-21 MED ORDER — SODIUM CHLORIDE 0.9 % IV BOLUS
250.0000 mL | Freq: Once | INTRAVENOUS | Status: AC
  Administered 2022-07-21: 250 mL via INTRAVENOUS

## 2022-07-21 MED ORDER — ONDANSETRON HCL 4 MG PO TABS: 4.0000 mg | ORAL_TABLET | Freq: Four times a day (QID) | ORAL | Status: DC | PRN

## 2022-07-21 MED ORDER — LORATADINE 10 MG PO TABS: 10.0000 mg | ORAL_TABLET | Freq: Every day | ORAL | Status: DC

## 2022-07-21 MED ORDER — LORATADINE 10 MG PO TABS
10.0000 mg | ORAL_TABLET | Freq: Every day | ORAL | Status: DC
  Administered 2022-07-22 – 2022-07-23 (×2): 10 mg via ORAL
  Filled 2022-07-21 (×2): qty 1

## 2022-07-21 MED ORDER — ALLOPURINOL 100 MG PO TABS
100.0000 mg | ORAL_TABLET | Freq: Two times a day (BID) | ORAL | Status: DC
  Administered 2022-07-21 – 2022-07-23 (×5): 100 mg via ORAL
  Filled 2022-07-21 (×5): qty 1

## 2022-07-21 MED ORDER — ENOXAPARIN SODIUM 40 MG/0.4ML IJ SOSY
40.0000 mg | PREFILLED_SYRINGE | INTRAMUSCULAR | Status: DC
  Administered 2022-07-21 – 2022-07-23 (×3): 40 mg via SUBCUTANEOUS
  Filled 2022-07-21 (×3): qty 0.4

## 2022-07-21 MED ORDER — ONDANSETRON HCL 4 MG/2ML IJ SOLN: 4.0000 mg | Freq: Four times a day (QID) | INTRAMUSCULAR | Status: DC | PRN

## 2022-07-21 MED ORDER — SODIUM CHLORIDE 0.9% FLUSH
3.0000 mL | Freq: Two times a day (BID) | INTRAVENOUS | Status: DC
  Administered 2022-07-22 – 2022-07-23 (×4): 3 mL via INTRAVENOUS

## 2022-07-21 MED ORDER — ACETAMINOPHEN 650 MG RE SUPP: 650.0000 mg | Freq: Four times a day (QID) | RECTAL | Status: DC | PRN

## 2022-07-21 MED ORDER — HYDROCODONE-ACETAMINOPHEN 7.5-325 MG PO TABS: 1.0000 | ORAL_TABLET | Freq: Four times a day (QID) | ORAL | Status: DC | PRN

## 2022-07-21 MED ORDER — DILTIAZEM HCL ER COATED BEADS 120 MG PO CP24
120.0000 mg | ORAL_CAPSULE | Freq: Every day | ORAL | Status: DC
  Administered 2022-07-21: 120 mg via ORAL
  Filled 2022-07-21: qty 1

## 2022-07-21 MED ORDER — ACETAMINOPHEN 325 MG PO TABS: 650.0000 mg | ORAL_TABLET | Freq: Four times a day (QID) | ORAL | Status: DC | PRN

## 2022-07-21 MED ORDER — ALBUTEROL SULFATE HFA 108 (90 BASE) MCG/ACT IN AERS: 2.0000 | INHALATION_SPRAY | Freq: Four times a day (QID) | RESPIRATORY_TRACT | Status: DC | PRN

## 2022-07-21 MED ORDER — MIRABEGRON ER 25 MG PO TB24
25.0000 mg | ORAL_TABLET | Freq: Every day | ORAL | Status: DC
  Administered 2022-07-22 (×2): 25 mg via ORAL
  Filled 2022-07-21 (×2): qty 1

## 2022-07-21 MED ORDER — POLYETHYLENE GLYCOL 3350 17 G PO PACK: 17.0000 g | PACK | Freq: Every day | ORAL | Status: DC | PRN

## 2022-07-21 MED ORDER — PANTOPRAZOLE SODIUM 40 MG PO TBEC
40.0000 mg | DELAYED_RELEASE_TABLET | Freq: Every day | ORAL | Status: DC
  Administered 2022-07-21 – 2022-07-23 (×3): 40 mg via ORAL
  Filled 2022-07-21 (×3): qty 1

## 2022-07-21 MED ORDER — EZETIMIBE 10 MG PO TABS
10.0000 mg | ORAL_TABLET | Freq: Every day | ORAL | Status: DC
  Administered 2022-07-22 (×2): 10 mg via ORAL
  Filled 2022-07-21 (×2): qty 1

## 2022-07-21 MED ORDER — DOCUSATE SODIUM 100 MG PO CAPS
100.0000 mg | ORAL_CAPSULE | Freq: Two times a day (BID) | ORAL | Status: DC
  Administered 2022-07-21 – 2022-07-23 (×5): 100 mg via ORAL
  Filled 2022-07-21 (×5): qty 1

## 2022-07-21 MED ORDER — ATORVASTATIN CALCIUM 80 MG PO TABS
80.0000 mg | ORAL_TABLET | Freq: Every day | ORAL | Status: DC
  Administered 2022-07-22 (×2): 80 mg via ORAL
  Filled 2022-07-21: qty 1
  Filled 2022-07-21: qty 2

## 2022-07-21 MED ORDER — LEVOTHYROXINE SODIUM 100 MCG PO TABS
100.0000 ug | ORAL_TABLET | Freq: Every day | ORAL | Status: DC
  Administered 2022-07-22: 100 ug via ORAL
  Filled 2022-07-21: qty 1

## 2022-07-21 MED ORDER — ALBUTEROL SULFATE (2.5 MG/3ML) 0.083% IN NEBU: 2.5000 mg | INHALATION_SOLUTION | Freq: Four times a day (QID) | RESPIRATORY_TRACT | Status: DC | PRN

## 2022-07-21 MED ORDER — HYDRALAZINE HCL 20 MG/ML IJ SOLN: 5.0000 mg | INTRAMUSCULAR | Status: DC | PRN

## 2022-07-21 MED ORDER — CLONAZEPAM 0.5 MG PO TABS: 1.0000 mg | ORAL_TABLET | Freq: Three times a day (TID) | ORAL | Status: DC | PRN

## 2022-07-21 MED ORDER — BISACODYL 5 MG PO TBEC: 5.0000 mg | DELAYED_RELEASE_TABLET | Freq: Every day | ORAL | Status: DC | PRN

## 2022-07-21 MED ORDER — ALFUZOSIN HCL ER 10 MG PO TB24
10.0000 mg | ORAL_TABLET | Freq: Every day | ORAL | Status: DC
  Administered 2022-07-22 (×2): 10 mg via ORAL
  Filled 2022-07-21 (×3): qty 1

## 2022-07-21 MED ORDER — BUPROPION HCL ER (XL) 150 MG PO TB24
300.0000 mg | ORAL_TABLET | Freq: Every day | ORAL | Status: DC
  Administered 2022-07-21 – 2022-07-23 (×3): 300 mg via ORAL
  Filled 2022-07-21 (×2): qty 2
  Filled 2022-07-21: qty 1

## 2022-07-21 NOTE — Telephone Encounter (Signed)
Called April no answer LMOM w/MD response../l,mb

## 2022-07-21 NOTE — Telephone Encounter (Signed)
Okay.  Thanks.

## 2022-07-21 NOTE — Evaluation (Addendum)
Physical Therapy Evaluation Patient Details Name: [name] MRN: [id number] DOB: 08/28/1946 Today's Date: 07/21/2022  History of Present Illness  Pt is a 76 y/o male who presented with recurrent falls at home in setting of orthostatic hypotension. Pt recently admitted 9/1-9/7 for a fall at home. PMH: a fib, BPH; CAD; chronic diastolic CHF; B cell lymphoma; HTN; HLD; OSA on CPAP; plaque psoriasis; and hypothyroidism  Clinical Impression  Pt admitted secondary to problem above with deficits below. Pt requiring min guard to stand at EOB, however, pt with +orthostatics so further mobility deferred. Anticipate pt will progress well as BP improves. Recommend HHPT at d/c to address mobility deficits. Will continue to follow acutely.     BP sitting up in stretcher: 121/67 BP sitting EOB: 117/66 BP standing: 87/57 BP after return to stretcher: 114/70        Recommendations for follow up therapy are one component of a multi-disciplinary discharge planning process, led by the attending physician.  Recommendations may be updated based on patient status, additional functional criteria and insurance authorization.  Follow Up Recommendations Home health PT      Assistance Recommended at Discharge Frequent or constant Supervision/Assistance  Patient can return home with the following  A little help with walking and/or transfers;A little help with bathing/dressing/bathroom;Assistance with cooking/housework;Assist for transportation;Help with stairs or ramp for entrance    Equipment Recommendations None recommended by PT  Recommendations for Other Services       Functional Status Assessment Patient has had a recent decline in their functional status and demonstrates the ability to make significant improvements in function in a reasonable and predictable amount of time.     Precautions / Restrictions Precautions Precautions: Fall Precaution Comments: monitor  orthostatics Restrictions Weight Bearing Restrictions: No      Mobility  Bed Mobility Overal bed mobility: Needs Assistance Bed Mobility: Supine to Sit, Sit to Supine     Supine to sit: Supervision Sit to supine: Supervision        Transfers Overall transfer level: Needs assistance Equipment used: Rolling walker (2 wheels) Transfers: Sit to/from Stand Sit to Stand: Min guard           General transfer comment: Min guard for safety. Pt's BP dropping to 87/57 in standing, so further mobility deferred.    Ambulation/Gait                  Stairs            Wheelchair Mobility    Modified Rankin (Stroke Patients Only)       Balance Overall balance assessment: Needs assistance, History of Falls Sitting-balance support: No upper extremity supported, Feet supported Sitting balance-Leahy Scale: Fair     Standing balance support: Bilateral upper extremity supported, Reliant on assistive device for balance Standing balance-Leahy Scale: Poor                               Pertinent Vitals/Pain Pain Assessment Pain Assessment: No/denies pain    Home Living Family/patient expects to be discharged to:: Private residence Living Arrangements: Spouse/significant other Available Help at Discharge: Available 24 hours/day;Family Type of Home: House Home Access: Stairs to enter Entrance Stairs-Rails: Right Entrance Stairs-Number of Steps: 4 Alternate Level Stairs-Number of Steps: has chair lift - still needs to get up 4 steps to chair lift Home Layout: Two level Home Equipment: Rolling Walker (2 wheels);Cane - single point;Wheelchair - Brewing technologist  Prior Function Prior Level of Function : Independent/Modified Independent;Driving             Mobility Comments: amb with RW in home and community ADLs Comments: independent with ADLs     Hand Dominance   Dominant Hand: Left    Extremity/Trunk Assessment   Upper Extremity  Assessment Upper Extremity Assessment: Defer to OT evaluation    Lower Extremity Assessment Lower Extremity Assessment: Generalized weakness    Cervical / Trunk Assessment Cervical / Trunk Assessment: Other exceptions Cervical / Trunk Exceptions: scoliosis  Communication   Communication: No difficulties  Cognition Arousal/Alertness: Awake/alert Behavior During Therapy: WFL for tasks assessed/performed Overall Cognitive Status: Within Functional Limits for tasks assessed                                          General Comments      Exercises     Assessment/Plan    PT Assessment Patient needs continued PT services  PT Problem List Decreased strength;Decreased activity tolerance;Decreased balance;Decreased mobility;Cardiopulmonary status limiting activity       PT Treatment Interventions DME instruction;Gait training;Functional mobility training;Therapeutic activities;Therapeutic exercise;Balance training;Patient/family education    PT Goals (Current goals can be found in the Care Plan section)  Acute Rehab PT Goals Patient Stated Goal: to go home PT Goal Formulation: With patient Time For Goal Achievement: 08/04/22 Potential to Achieve Goals: Good    Frequency Min 3X/week     Co-evaluation PT/OT/SLP Co-Evaluation/Treatment: Yes Reason for Co-Treatment: To address functional/ADL transfers;For patient/therapist safety PT goals addressed during session: Balance;Mobility/safety with mobility OT goals addressed during session: ADL's and self-care       AM-PAC PT "6 Clicks" Mobility  Outcome Measure                  End of Session Equipment Utilized During Treatment: Gait belt Activity Tolerance: Treatment limited secondary to medical complications (Comment) (+ orthostatics) Patient left: in bed;with call bell/phone within reach (on stretcher in ED) Nurse Communication: Mobility status PT Visit Diagnosis: Repeated falls  (R29.6);Unsteadiness on feet (R26.81);Muscle weakness (generalized) (M62.81)    Time: 9381-0175 PT Time Calculation (min) (ACUTE ONLY): 22 min   Charges:   PT Evaluation $PT Eval Moderate Complexity: 1 Mod          Reuel Derby, PT, DPT  Acute Rehabilitation Services  Office: [phone]   Rudean Hitt 07/21/2022, 4:32 PM

## 2022-07-21 NOTE — Evaluation (Signed)
Occupational Therapy Evaluation Patient Details Name: [name] MRN: [id number] DOB: 27-Jan-1946 Today's Date: 07/21/2022   History of Present Illness Pt is a 76 y/o male who presented with recurrent falls at home in setting of orthostatic hypotension. Pt recently admitted 9/1-9/7 for a fall at home. PMH: a fib, BPH; CAD; chronic diastolic CHF; B cell lymphoma; HTN; HLD; OSA on CPAP; plaque psoriasis; and hypothyroidism   Clinical Impression   PTA, pt lives with spouse, typically Modified Independent with ADLs, IADLs and mobility using RW though endorses frequent falls at home. Pt presents now with deficits in dynamic standing balance and overall endurance. Pt requires min guard to stand with RW - unable to progress mobility today due to continued orthostatic BP (though pt denies lightheadedness). Pt requires Setup for UB ADL and Min A for LB ADLs. Recommend HHOT vs no OT follow up pending improvements of BP with mobility.   BP sitting up in stretcher: 121/67 BP sitting EOB: 117/66 BP standing: 87/57 BP after return to stretcher: 114/70   Recommendations for follow up therapy are one component of a multi-disciplinary discharge planning process, led by the attending physician.  Recommendations may be updated based on patient status, additional functional criteria and insurance authorization.   Follow Up Recommendations  Home health OT (vs no OT follow up pending progression)    Assistance Recommended at Discharge Intermittent Supervision/Assistance  Patient can return home with the following A little help with bathing/dressing/bathroom;Assistance with cooking/housework;Assist for transportation;Help with stairs or ramp for entrance    Functional Status Assessment  Patient has had a recent decline in their functional status and demonstrates the ability to make significant improvements in function in a reasonable and predictable amount of time.  Equipment Recommendations  None  recommended by OT    Recommendations for Other Services       Precautions / Restrictions Precautions Precautions: Fall Precaution Comments: monitor orthostatics Restrictions Weight Bearing Restrictions: No      Mobility Bed Mobility Overal bed mobility: Needs Assistance Bed Mobility: Supine to Sit, Sit to Supine     Supine to sit: Supervision Sit to supine: Supervision        Transfers Overall transfer level: Needs assistance Equipment used: Rolling walker (2 wheels) Transfers: Sit to/from Stand Sit to Stand: Min guard                  Balance Overall balance assessment: Needs assistance, History of Falls Sitting-balance support: No upper extremity supported, Feet supported Sitting balance-Leahy Scale: Fair     Standing balance support: Bilateral upper extremity supported, Reliant on assistive device for balance Standing balance-Leahy Scale: Poor                             ADL either performed or assessed with clinical judgement   ADL Overall ADL's : Needs assistance/impaired Eating/Feeding: Independent   Grooming: Set up;Sitting   Upper Body Bathing: Set up;Sitting   Lower Body Bathing: Minimal assistance;Sit to/from stand   Upper Body Dressing : Set up   Lower Body Dressing: Minimal assistance Lower Body Dressing Details (indicate cue type and reason): supported balance in standing while pulling underwear fully up Toilet Transfer: Minimal assistance;Stand-pivot;Rolling walker (2 wheels)   Toileting- Clothing Manipulation and Hygiene: Minimal assistance;Sit to/from stand         General ADL Comments: Limited by orthostatic BP (asymptomatic) with hx of falls over the past decade.     Vision  Baseline Vision/History: 1 Wears glasses Ability to See in Adequate Light: 0 Adequate Patient Visual Report: No change from baseline Vision Assessment?: No apparent visual deficits     Perception     Praxis      Pertinent Vitals/Pain  Pain Assessment Pain Assessment: No/denies pain     Hand Dominance Left   Extremity/Trunk Assessment Upper Extremity Assessment Upper Extremity Assessment: Overall WFL for tasks assessed   Lower Extremity Assessment Lower Extremity Assessment: Defer to PT evaluation   Cervical / Trunk Assessment Cervical / Trunk Assessment: Other exceptions Cervical / Trunk Exceptions: scoliosis   Communication Communication Communication: No difficulties   Cognition Arousal/Alertness: Awake/alert Behavior During Therapy: WFL for tasks assessed/performed Overall Cognitive Status: Within Functional Limits for tasks assessed                                       General Comments       Exercises     Shoulder Instructions      Home Living Family/patient expects to be discharged to:: Private residence Living Arrangements: Spouse/significant other Available Help at Discharge: Available 24 hours/day;Family Type of Home: House Home Access: Stairs to enter Technical brewer of Steps: 4 Entrance Stairs-Rails: Right Home Layout: Two level Alternate Level Stairs-Number of Steps: has chair lift - still needs to get up 4 steps to chair lift   Bathroom Shower/Tub: Occupational psychologist: Handicapped height Bathroom Accessibility: Yes How Accessible: Accessible via walker Home Equipment: Rolling Walker (2 wheels);Cane - single point;Wheelchair - manual;Shower seat          Prior Functioning/Environment Prior Level of Function : Independent/Modified Independent;Driving             Mobility Comments: amb with RW in home and community ADLs Comments: independent with ADLs        OT Problem List: Decreased activity tolerance;Impaired balance (sitting and/or standing)      OT Treatment/Interventions: Self-care/ADL training;Therapeutic exercise;DME and/or AE instruction;Therapeutic activities    OT Goals(Current goals can be found in the care plan  section) Acute Rehab OT Goals Patient Stated Goal: resolve dropping BP OT Goal Formulation: With patient Time For Goal Achievement: 08/04/22 Potential to Achieve Goals: Good  OT Frequency: Min 2X/week    Co-evaluation PT/OT/SLP Co-Evaluation/Treatment: Yes Reason for Co-Treatment: To address functional/ADL transfers;For patient/therapist safety   OT goals addressed during session: ADL's and self-care      AM-PAC OT "6 Clicks" Daily Activity     Outcome Measure Help from another person eating meals?: None Help from another person taking care of personal grooming?: A Little Help from another person toileting, which includes using toliet, bedpan, or urinal?: A Little Help from another person bathing (including washing, rinsing, drying)?: A Little Help from another person to put on and taking off regular upper body clothing?: A Little Help from another person to put on and taking off regular lower body clothing?: A Little 6 Click Score: 19   End of Session Equipment Utilized During Treatment: Rolling walker (2 wheels) Nurse Communication: Mobility status  Activity Tolerance: Patient tolerated treatment well Patient left: in bed;with call bell/phone within reach  OT Visit Diagnosis: Unsteadiness on feet (R26.81);Other abnormalities of gait and mobility (R26.89);History of falling (Z91.81)                Time: 3785-8850 OT Time Calculation (min): 27 min Charges:  OT General Charges $OT  Visit: 1 Visit OT Evaluation $OT Eval Low Complexity: 1 Low  Malachy Chamber, OTR/L Acute Rehab Services Office: [phone]   Layla Maw 07/21/2022, 2:01 PM

## 2022-07-21 NOTE — ED Notes (Signed)
Pt returned from CT °

## 2022-07-21 NOTE — H&P (Signed)
History and Physical    Patient: [name] JKD:[id number] DOB: [birth year] DOA: 07/20/2022 DOS: the patient was seen and examined on 07/21/2022 PCP: Plotnikov, Evie Lacks, MD  Patient coming from: Home - lives with husband; NOK: Sarita Bottom, [phone]   Chief Complaint: Recurrent falls  HPI: [name] is a 76 y.o. male with medical history significant of afib on Xarelto; BPH; CAD; chronic diastolic CHF; B cell lymphoma; HTN; HLD; OSA on CPAP; plaque psoriasis; and hypothyroidism presenting with recurrent falls.  He reports that it started 10 years ago when he had a fall with significant injuries.  He has had ongoing issues since with recurrent falls.  He has been falling more recently.  He had 3 falls overnight and his husband called 911.  He was recently told that he should stand still for a minute after standing, before starting to walk; he is not sure if he is feeling dizzy when standing though.    ER Course:  Persistent orthostasis, likely needs med adjustment.  Fall on thinners, no trauma.  1.25 L given but still orthostatic.  Rate controlled.  Too orthostatic to walk.      Review of Systems: As mentioned in the history of present illness. All other systems reviewed and are negative. Past Medical History:  Diagnosis Date   Benign localized prostatic hyperplasia with lower urinary tract symptoms (LUTS)    CAD (coronary artery disease) cardiologist--  dr t. Oval Linsey   hx of known CAD obstruction w/ collaterals (cath done @ [facility] 12-31-2011) ;   last cardiac cath 08-13-2019  showed sig. 2V CAD involving proxLAD and CTO of the RCA/  chronic total occlusion midRCA w/ bridging and L>R collaterals   Diastolic CHF ([place])    dx [phone] hospital admission (followed by pcp)   Diffuse large B cell lymphoma [facility]) oncologist-- dr Irene Limbo--- in remission   dx 08/ 2019 -- bx left tonsill mass-- involving lymph nodes--- completed chemotherapy 10-12-2018   DJD  (degenerative joint disease)    Dysthymic disorder    Environmental allergies    GAD (generalized anxiety disorder)    with dysthymia   GERD (gastroesophageal reflux disease)    History of falling    multiple times   History of kidney stones    HLD (hyperlipidemia)    Hyperlipidemia    Hypertension    Hypogonadism in male    OA (osteoarthritis)    OSA on CPAP    cpap machine-settings 17   Osteoporosis with fracture 04/24/2013   Persistent atrial fibrillation ([place])    On Xarelto, cardiologist--- dr Alfonso Patten. [place]   Plaque psoriasis    dermatologist--- dr Harriett Sine--- currently taking otezla   Post-surgical hypothyroidism    followed by pcp---  s/p total thyroidectomy for graves disease in 1987   Wears hearing aid in both ears    Past Surgical History:  Procedure Laterality Date   APPENDECTOMY  2011 approx.    CARDIAC CATHETERIZATION  12-31-2011   dr j. Beatrix Fetters '@[facility]'$    normal LVF w/ multivessel CAD,  occluded RCA w/ colleterals   CATARACT EXTRACTION W/ INTRAOCULAR LENS  IMPLANT, BILATERAL  2016 approx.   COLONOSCOPY     CYSTOSCOPY WITH RETROGRADE PYELOGRAM, URETEROSCOPY AND STENT PLACEMENT Left 11/07/2017   Procedure: CYSTOSCOPY WITH RETROGRADE PYELOGRAM, URETEROSCOPY AND STENT PLACEMENT;  Surgeon: Cleon Gustin, MD;  Location: [facility];  Service: Urology;  Laterality: Left;   CYSTOSCOPY WITH RETROGRADE PYELOGRAM, URETEROSCOPY AND STENT PLACEMENT Right 10/22/2019   Procedure:  CYSTOSCOPY WITH RETROGRADE PYELOGRAM, URETEROSCOPY AND STENT PLACEMENT;  Surgeon: Cleon Gustin, MD;  Location: [facility];  Service: Urology;  Laterality: Right;   CYSTOSCOPY/URETEROSCOPY/HOLMIUM LASER/STENT PLACEMENT Right 03/11/2017   Procedure: CYSTOSCOPY/URETEROSCOPYSTENT PLACEMENT right ureter retrograde pylegram;  Surgeon: Cleon Gustin, MD;  [facility];  Service: Urology;  Laterality: Right;   HARDWARE REMOVAL  10/05/2012   Procedure: HARDWARE REMOVAL;  Surgeon:  [name], MD;  [facility];  Service: Orthopedics;  Laterality: Right;  REMOVING  STRYKER  GAMMA NAIL   HARDWARE REMOVAL Right 07/03/2013   Procedure: HARDWARE REMOVAL RIGHT TIBIA ;  Surgeon: Rozanna Box, MD;  Location: [place];  Service: Orthopedics;  Laterality: Right;   HIP CLOSED REDUCTION Right 10/15/2013   Procedure: CLOSED MANIPULATION HIP;  Surgeon: [name], MD;  [facility];  Service: Orthopedics;  Laterality: Right;   HOLMIUM LASER APPLICATION Right 0/04/3709   Procedure: HOLMIUM LASER APPLICATION;  Surgeon: Cleon Gustin, MD;  [facility];  Service: Urology;  Laterality: Right;   HOLMIUM LASER APPLICATION Left 04/05/9484   Procedure: HOLMIUM LASER APPLICATION;  Surgeon: Cleon Gustin, MD;  [facility];  Service: Urology;  Laterality: Left;   HOLMIUM LASER APPLICATION Left 01/14/2702   Procedure: HOLMIUM LASER APPLICATION;  Surgeon: Cleon Gustin, MD;  [facility];  Service: Urology;  Laterality: Left;   HOLMIUM LASER APPLICATION Right 5/00/9381   Procedure: HOLMIUM LASER APPLICATION;  Surgeon: Cleon Gustin, MD;  Location: [facility];  Service: Urology;  Laterality: Right;   INCISION AND DRAINAGE HIP Right 11/16/2013   Procedure: IRRIGATION AND DEBRIDEMENT RIGHT HIP;  Surgeon: [name], MD;  [facility];  Service: Orthopedics;  Laterality: Right;   INTRAVASCULAR PRESSURE WIRE/FFR STUDY N/A 08/13/2019   Procedure: INTRAVASCULAR PRESSURE WIRE/FFR STUDY;  Surgeon: Nelva Bush, MD;  Location: [facility];  Service: Cardiovascular;  Laterality: N/A;   IR IMAGING GUIDED PORT INSERTION  06/22/2018   IR NEPHROSTOMY PLACEMENT LEFT  03/28/2019   IR URETERAL STENT LEFT NEW ACCESS W/O SEP NEPHROSTOMY CATH  10/24/2017   IR URETERAL STENT LEFT NEW ACCESS W/O SEP NEPHROSTOMY CATH  03/26/2019   NEPHROLITHOTOMY Right 02/08/2017   Procedure: NEPHROLITHOTOMY PERCUTANEOUS WITH SURGEON ACCESS;  Surgeon: Cleon Gustin, MD;  [facility];  Service: Urology;  Laterality: Right;   NEPHROLITHOTOMY Left 10/24/2017   Procedure: NEPHROLITHOTOMY PERCUTANEOUS;  Surgeon: Cleon Gustin, MD;  [facility];  Service: Urology;  Laterality: Left;   NEPHROLITHOTOMY Left 03/26/2019   Procedure: NEPHROLITHOTOMY PERCUTANEOUS;  Surgeon: Cleon Gustin, MD;  [facility];  Service: Urology;  Laterality: Left;  2 HRS   NEPHROLITHOTOMY Left 04/17/2019   Procedure: NEPHROLITHOTOMY PERCUTANEOUS;  Surgeon: Cleon Gustin, MD;  [facility];  Service: Urology;  Laterality: Left;  2 HRS   ORIF TIBIA FRACTURE Right 02/06/2013   Procedure: OPEN REDUCTION INTERNAL FIXATION (ORIF) TIBIA FRACTURE WITH IM ROD FIBULA;  Surgeon: Rozanna Box, MD;  Location: [place];  Service: Orthopedics;  Laterality: Right;   ORIF TIBIA FRACTURE Right 07/03/2013   Procedure: RIGHT TIBIA NON UNION REPAIR ;  Surgeon: Rozanna Box, MD;  Location: [place];  Service: Orthopedics;  Laterality: Right;   ORIF WRIST FRACTURE  10/02/2012   Procedure: OPEN REDUCTION INTERNAL FIXATION (ORIF) WRIST FRACTURE;  Surgeon: Roseanne Kaufman, MD;  [facility];  Service: Orthopedics;  Laterality: Right;  WITH   ANTIBIOTIC  CEMENT   ORIF WRIST FRACTURE Left 10/28/2013   Procedure: OPEN REDUCTION INTERNAL FIXATION (ORIF) WRIST FRACTURE with allograft;  Surgeon: Roseanne Kaufman, MD;  [facility];  Service: Orthopedics;  Laterality: Left;  DVR Plate   QUADRICEPS TENDON REPAIR Left 07/15/2017   Procedure: REPAIR QUADRICEP TENDON;  Surgeon: Frederik Pear, MD;  Location: [place];  Service: Orthopedics;  Laterality: Left;   RIGHT/LEFT HEART CATH AND CORONARY ANGIOGRAPHY N/A 08/13/2019   Procedure: RIGHT/LEFT HEART CATH AND CORONARY ANGIOGRAPHY;  Surgeon: Nelva Bush, MD;  Location: [place] [facility];  Service: Cardiovascular;  Laterality: N/A;   THYROIDECTOMY  02/1986   TOTAL HIP ARTHROPLASTY Right 03-16-2016   '@[facility]'$    TOTAL HIP REVISION   10/05/2012   Procedure: TOTAL HIP REVISION;  Surgeon: [name], MD;  [facility];  Service: Orthopedics;  Laterality: Right;  RIGHT TOTAL HIP REVISION   TOTAL HIP REVISION Right 09/17/2013   Procedure: REVISION RIGHT TOTAL HIP ARTHROPLASTY ;  Surgeon: [name], MD;  [facility];  Service: Orthopedics;  Laterality: Right;   TOTAL HIP REVISION Right 10/26/2013   Procedure: REVISION RIGHT TOTAL HIP ARTHROPLASTY;  Surgeon: [name], MD;  [facility];  Service: Orthopedics;  Laterality: Right;   TOTAL KNEE ARTHROPLASTY Bilateral right 03-15-2011;  left 06-30-2011   TOTAL KNEE REVISION Left 04/11/2017   Procedure: TOTAL KNEE REVISION PATELLA and TIBIA;  Surgeon: Frederik Pear, MD;  Location: [place];  Service: Orthopedics;  Laterality: Left;   Social History:  reports that he quit smoking about 10 years ago. His smoking use included cigarettes. He has a 50.00 pack-year smoking history. He has never used smokeless tobacco. He reports current alcohol use. He reports that he does not use drugs.  Allergies  Allergen Reactions   Short Ragweed Pollen Ext Cough    Family History  Problem Relation Age of Onset   CAD Father 21   Asthma Father    Alcohol abuse Father    Arthritis Mother    Alcohol abuse Sister     Prior to Admission medications   Medication Sig Start Date End Date Taking? Authorizing Provider  acetaminophen (TYLENOL) 325 MG tablet Take 2 tablets (650 mg total) by mouth every 6 (six) hours as needed for mild pain (or Fever >/= 101). 03/25/22   Raiford Noble Latif, DO  albuterol (VENTOLIN HFA) 108 (90 Base) MCG/ACT inhaler Inhale 2 puffs into the lungs every 6 (six) hours as needed for wheezing or shortness of breath.    [provider]  alfuzosin (UROXATRAL) 10 MG 24 hr tablet TAKE 1 TABLET AT BEDTIME Patient not taking: Reported on 06/11/2022 03/19/21   Cleon Gustin, MD  allopurinol (ZYLOPRIM) 100 MG tablet Take 1 tablet (100 mg total) by mouth 2  (two) times daily. 06/30/22   Truman Hayward, MD  amoxicillin-clavulanate (AUGMENTIN) 875-125 MG tablet Take 1 tablet by mouth 2 (two) times daily. Patient not taking: Reported on 06/11/2022 05/20/22   Plotnikov, Evie Lacks, MD  atorvastatin (LIPITOR) 80 MG tablet Take 80 mg by mouth at bedtime.    [provider]  buPROPion (WELLBUTRIN XL) 300 MG 24 hr tablet TAKE 1 TABLET EVERY DAY WITH BREAKFAST 07/19/22   Plotnikov, Evie Lacks, MD  Calcium Citrate-Vitamin D (CALCIUM CITRATE + D PO) Take 1 tablet by mouth 2 (two) times daily. '1200mg'$  of Calcium and 1000 units vitamin D3    [provider]  clonazePAM (KLONOPIN) 2 MG tablet Take 0.5  tablets (1 mg total) by mouth 3 (three) times daily as needed for anxiety. 06/19/22   Plotnikov, Evie Lacks, MD  CRANBERRY PO Take 2 tablets by mouth every morning.    [provider]  denosumab (PROLIA) 60 MG/ML SOSY injection Inject 60 mg as directed every 6 (six) months. 12/10/15   [provider]  dexamethasone (DECADRON) 4 MG tablet Take 5 tablets (20 mg total) by mouth daily. Start the day after carboplatin chemotherapy for 3 days. Patient not taking: Reported on 06/02/2022 03/19/22   Brunetta Genera, MD  diltiazem (CARDIZEM CD) 120 MG 24 hr capsule Take 1 capsule (120 mg total) by mouth daily. 07/15/22   Plotnikov, Evie Lacks, MD  Docusate Sodium 100 MG capsule Take 100 mg by mouth 2 (two) times daily. Patient not taking: Reported on 06/30/2022    [provider]  DULoxetine (CYMBALTA) 60 MG capsule TAKE 1 CAPSULE TWICE DAILY 07/19/22   Plotnikov, Evie Lacks, MD  EPINEPHrine 0.3 mg/0.3 mL IJ SOAJ injection Inject 0.3 mg into the muscle once as needed for anaphylaxis (severe allergic reaction). Patient not taking: Reported on 06/02/2022 07/31/21   [provider]  ezetimibe (ZETIA) 10 MG tablet TAKE 1 TABLET EVERY DAY Patient not taking: Reported on 06/11/2022 08/10/21   Skeet L[place], MD  feeding supplement (ENSURE  ENLIVE / ENSURE PLUS) LIQD Take 237 mLs by mouth 2 (two) times daily between meals. Patient taking differently: Take 237 mLs by mouth daily. 03/25/22   Raiford Noble Latif, DO  HYDROcodone-acetaminophen (NORCO) 7.5-325 MG tablet Take 1 tablet by mouth every 6 (six) hours as needed for severe pain. 03/26/22   Raiford Noble Latif, DO  levothyroxine (SYNTHROID) 100 MCG tablet Take 1 tablet (100 mcg total) by mouth daily before breakfast. 05/24/22   Plotnikov, Evie Lacks, MD  lidocaine-prilocaine (EMLA) cream Apply 1 Application topically as needed. Patient taking differently: Apply 1 Application topically as needed (For port access/chemotherapy). 04/21/22   Brunetta Genera, MD  loratadine (CLARITIN) 10 MG tablet Take 10 mg by mouth every morning. Patient not taking: Reported on 06/30/2022    [provider]  Magnesium 500 MG CAPS Take 1 capsule (500 mg total) by mouth daily. Patient taking differently: Take 500 mg by mouth daily with supper. 10/13/18   Brunetta Genera, MD  Menthol, Topical Analgesic, (BIOFREEZE) 10 % CREA Apply 1 Application topically daily as needed (Knee pain).    [provider]  metoprolol tartrate (LOPRESSOR) 25 MG tablet Take 1 tablet (25 mg total) by mouth 2 (two) times daily. Patient not taking: Reported on 06/11/2022 05/17/22   Loel Dubonnet, NP  Multiple Vitamin (MULTIVITAMIN WITH MINERALS) TABS tablet Take 1 tablet by mouth every morning. Men's One-A-Day 50+    [provider]  MYRBETRIQ 25 MG TB24 tablet TAKE 1 TABLET EVERY DAY Patient taking differently: Take 25 mg by mouth at bedtime. 10/06/21   McKenzie, Candee Furbish, MD  nitroGLYCERIN (NITROSTAT) 0.4 MG SL tablet Place 1 tablet (0.4 mg total) under the tongue every 5 (five) minutes as needed for chest pain (max 3 doses). Patient not taking: Reported on 06/02/2022 05/17/22   Loel Dubonnet, NP  omeprazole (PRILOSEC) 40 MG capsule TAKE 1 CAPSULE EVERY DAY BEFORE BREAKFAST 07/19/22   Plotnikov,  Evie Lacks, MD  ondansetron (ZOFRAN) 8 MG tablet Take 1 tablet (8 mg total) by mouth 2 (two) times daily as needed. Start on the 3rd day after cisplatin chemotherapy. Patient not taking: Reported on 06/02/2022  03/19/22   Brunetta Genera, MD  Potassium Citrate 15 MEQ (1620 MG) TBCR Take 1 tablet by mouth See admin instructions. Take one tablet (15 meq) by mouth twice daily - with lunch and supper Patient taking differently: Take 1 tablet by mouth in the morning and at bedtime. 06/10/22   McKenzie, Candee Furbish, MD  Probiotic Product (PROBIOTIC PO) Take 1 capsule by mouth daily with breakfast.    [provider]  prochlorperazine (COMPAZINE) 10 MG tablet Take 1 tablet (10 mg total) by mouth every 6 (six) hours as needed (Nausea or vomiting). Patient not taking: Reported on 06/11/2022 03/19/22   Brunetta Genera, MD  rivaroxaban (XARELTO) 20 MG TABS tablet Take 1 tablet (20 mg total) by mouth daily with supper. 05/26/22   Loel Dubonnet, NP  testosterone enanthate (DELATESTRYL) 200 MG/ML injection INJECT 1ML ('200MG'$ ) INTO THE MUSCLE EVERY 14 DAYS. DISCARD VIAL AFTER 28 DAYS. Patient taking differently: Inject 200 mg into the muscle every 14 (fourteen) days. Every other Friday 09/22/21   Plotnikov, Evie Lacks, MD  vitamin C (ASCORBIC ACID) 500 MG tablet Take 500 mg by mouth daily with lunch.    [provider]    Physical Exam: Vitals:   07/21/22 1600 07/21/22 1605 07/21/22 1606 07/21/22 1630  BP:  101/73 101/62   Pulse: 87 91 91 91  Resp: 16 20 (!) 24 (!) 26  Temp:      TempSrc:      SpO2: 98% 93% 97% 96%  Weight:      Height:       General:  Appears calm and comfortable and is in NAD, appears older than stated age Eyes:  PERRL, EOMI, normal lids, iris ENT:  grossly normal hearing, lips & tongue, mmm Neck:  no LAD, masses or thyromegaly Cardiovascular:  RRR, no m/r/g. Tr LE edema.  Respiratory:   CTA bilaterally with no wheezes/rales/rhonchi.  Normal respiratory  effort. Abdomen:  soft, NT, ND Skin:  no rash or induration seen on limited exam Musculoskeletal:  grossly normal tone BUE/BLE, good ROM, no bony abnormality Psychiatric:  grossly normal mood and affect, speech fluent and appropriate, AOx3 Neurologic:  CN 2-12 grossly intact, moves all extremities in coordinated fashion   Radiological Exams on Admission: Independently reviewed - see discussion in A/P where applicable  CT Renal Stone Study  Result Date: 07/21/2022 CLINICAL DATA:  Nephrolithiasis EXAM: CT ABDOMEN AND PELVIS WITHOUT CONTRAST TECHNIQUE: Multidetector CT imaging of the abdomen and pelvis was performed following the standard protocol without IV contrast. RADIATION DOSE REDUCTION: This exam was performed according to the departmental dose-optimization program which includes automated exposure control, adjustment of the mA and/or kV according to patient size and/or use of iterative reconstruction technique. COMPARISON:  PET-CT dated 05/19/2022. CT abdomen/pelvis dated 03/06/2022. FINDINGS: Lower chest: Mild right basilar opacity, likely atelectasis. Hepatobiliary: Unenhanced liver is unremarkable. Gallbladder is unremarkable. No intrahepatic or extrahepatic ductal dilatation. Pancreas: Within normal limits. Spleen: Within normal limits. Adrenals/Urinary Tract: 2.0 cm low-density right adrenal nodule, measuring less than 10 HUs, compatible with a benign adrenal adenoma. No follow-up is recommended. Left adrenal gland is within normal limits. 3 mm nonobstructing interpolar left renal calculus (series 3/image 41). Right kidney is within normal limits. No ureteral or bladder calculi. No hydronephrosis. Bladder is within normal limits. Stomach/Bowel: Stomach is within normal limits. No evidence of bowel obstruction. Appendix is not discretely visualized. No colonic wall thickening or inflammatory changes. Vascular/Lymphatic: No evidence of abdominal aortic aneurysm. Atherosclerotic calcifications  of the abdominal aorta and branch vessels. No suspicious abdominopelvic lymphadenopathy. Reproductive: Prostate is largely obscured by streak artifact. Other: No abdominopelvic ascites. Small fat containing periumbilical hernia (series 3/image 52). Musculoskeletal: Right hip arthroplasty, status post revision with cerclage wire fixation. Severe compression fracture at L1, with moderate retropulsion and kyphotic deformity, unchanged. Mild degenerative changes. IMPRESSION: 3 mm nonobstructing interpolar left renal calculus. No ureteral or bladder calculi. No hydronephrosis. Additional stable ancillary findings as above. Electronically Signed   By: Julian Hy M.D.   On: 07/21/2022 01:23   CT Head Wo Contrast  Result Date: 07/20/2022 CLINICAL DATA:  Head trauma. Level 2 fall on blood thinners. Several falls today. EXAM: CT HEAD WITHOUT CONTRAST TECHNIQUE: Contiguous axial images were obtained from the base of the skull through the vertex without intravenous contrast. RADIATION DOSE REDUCTION: This exam was performed according to the departmental dose-optimization program which includes automated exposure control, adjustment of the mA and/or kV according to patient size and/or use of iterative reconstruction technique. COMPARISON:  CT head dated June 17, 2022 FINDINGS: Brain: No evidence of acute infarction, hemorrhage, hydrocephalus, extra-axial collection or mass lesion/mass effect. Prominence of the ventricles and sulci secondary to moderate cerebral atrophy. Patchy area of low-attenuation of the periventricular and subcortical white matter presumed chronic microvascular ischemic changes. Vascular: No hyperdense vessel or unexpected calcification. Skull: Normal. Negative for fracture or focal lesion. Sinuses/Orbits: No acute finding. Other: None. IMPRESSION: 1. No acute intracranial process. 2. Cerebral atrophy and chronic microvascular ischemic changes of the white matter, unchanged. Electronically  Signed   By: Keane Police D.O.   On: 07/20/2022 23:48   DG Chest Port 1 View  Result Date: 07/20/2022 CLINICAL DATA:  Fall, chronic anticoagulation EXAM: PORTABLE CHEST 1 VIEW COMPARISON:  06/17/2022 FINDINGS: Lungs are clear. No pneumothorax or pleural effusion. Cardiac size is mildly enlarged, unchanged. Right internal jugular chest port tip noted within the superior vena cava. Pulmonary vascularity is normal. No acute bone abnormality. IMPRESSION: Stable cardiomegaly. Electronically Signed   By: Fidela Salisbury M.D.   On: 07/20/2022 23:37    EKG: Independently reviewed.  Afib with rate 89; no evidence of acute ischemia   Labs on Admission: I have personally reviewed the available labs and imaging studies at the time of the admission.  Pertinent labs:    BUN 29/Creatinine 1.42/GFR 51 - stable Lactate 1.2 WBC 6.5 Hgb 12.2 UA: moderate Hgb, small LE, 100 protein, few bacteria, >50 RBC   Assessment and Plan: Principal Problem:   Recurrent falls Active Problems:   Hypothyroidism   Diffuse large B-cell lymphoma (HCC)   Chronic diastolic heart failure (HCC)   BPH with obstruction/lower urinary tract symptoms   Paroxysmal atrial fibrillation (HCC)   Stage 3a chronic kidney disease (CKD) (HCC) - Baseline Scr 1.5 - 1.6   DNR (do not resuscitate)/DNI(Do Not Intubate)   CAD (coronary artery disease)   Dyslipidemia   OSA (obstructive sleep apnea)   Orthostatic hypotension    Recurrent falls -Patient with 10 years of falling, worse recently -This mostly appears to be failure to thrive, although he was found to be orthostatic today and so this may also be contributing -There may be some level of cognitive impairment; will order speech therapy cognitive/language evaluation -Will observe with PT/OT/ST/nutrition evaluations -Recurrent lymphoma may also be contributing (see below) -Will request palliative care consult  Orthostatic hypotension -He is taking diltiazem, metoprolol  BID -Will continue Dilt for rate control, hold metoprolol -PT evaluation  Chronic pain -Some of  his gait instability may be associated with sedating medications -I have reviewed this patient in the [place] Controlled Substances Reporting System.  He is receiving medications from only one provider and appears to be taking them as prescribed. -He is at particularly high risk of opioid misuse, diversion, or overdose.  -He appears to have had a 2-3 month break from Vicodin and Klonopin and then resumed them in early September; this may need ongoing consideration as an outpatient. -Hold Zanaflex for now  B cell lymphoma -He has relapsing disease -Previously treated with R-CHOP in 2019 -Found to have multifocal progression in 01/2022 -Treated with 3 cycles of R GDP (salvage chemotherapy) that was complicated by recurrent urosepsis from ESBL -He is now being considered for palliative radiation  Mood d/o -Continue Wellbutrin, Cymbalta, Klonopin for now  Afib -Xarelto is recently on hold due to falls, need to consider whether to resume -Continue Cardizem for rate control -Hold metoprolol for now  BPH -Continue Uroxatrol, Myrbetriq  CAD/Chronic diastolic CHF -Appears compensated at this time  HLD -Continue atorvastatin, Zetia  Hypothyroidism -Continue Synthroid  Psoriasis -On Skyrizi at home  Stage 3a CBD -Appears to be stable at this time -Attempt to avoid nephrotoxic medications -Recheck BMP in AM   OSA -Continue CPAP  DNR -I have discussed code status with the patient and he would not desire resuscitation and would prefer to die a natural death should that situation arise. -He will need a gold out of facility DNR form at the time of discharge    Advance Care Planning:   Code Status: DNR   Consults: Palliative care; PT/OT/ST; nutrition; TOC team  DVT Prophylaxis: Lovenox  Family Communication: None present; his husband left just prior to my evaluation and is aware of the  plan  Severity of Illness: The appropriate patient status for this patient is INPATIENT. Inpatient status is judged to be reasonable and necessary in order to provide the required intensity of service to ensure the patient's safety. The patient's presenting symptoms, physical exam findings, and initial radiographic and laboratory data in the context of their chronic comorbidities is felt to place them at high risk for further clinical deterioration. Furthermore, it is not anticipated that the patient will be medically stable for discharge from the hospital within 2 midnights of admission.   * I certify that at the point of admission it is my clinical judgment that the patient will require inpatient hospital care spanning beyond 2 midnights from the point of admission due to high intensity of service, high risk for further deterioration and high frequency of surveillance required.*  Author: Karmen Bongo, MD 07/21/2022 6:21 PM  For on call review [email or web address].

## 2022-07-21 NOTE — ED Notes (Signed)
MD Ralene Bathe notified of Orthostatics

## 2022-07-21 NOTE — ED Notes (Signed)
Pt given remainder of fluids per MD Ralene Bathe

## 2022-07-22 ENCOUNTER — Encounter (HOSPITAL_COMMUNITY): Payer: Self-pay | Admitting: Internal Medicine

## 2022-07-22 ENCOUNTER — Observation Stay (HOSPITAL_COMMUNITY): Payer: Medicare PPO

## 2022-07-22 DIAGNOSIS — C8338 Diffuse large B-cell lymphoma, lymph nodes of multiple sites: Secondary | ICD-10-CM

## 2022-07-22 DIAGNOSIS — Z66 Do not resuscitate: Secondary | ICD-10-CM

## 2022-07-22 DIAGNOSIS — Z515 Encounter for palliative care: Secondary | ICD-10-CM

## 2022-07-22 DIAGNOSIS — I951 Orthostatic hypotension: Secondary | ICD-10-CM | POA: Diagnosis not present

## 2022-07-22 DIAGNOSIS — R296 Repeated falls: Secondary | ICD-10-CM | POA: Diagnosis not present

## 2022-07-22 DIAGNOSIS — Z7189 Other specified counseling: Secondary | ICD-10-CM

## 2022-07-22 DIAGNOSIS — C7931 Secondary malignant neoplasm of brain: Secondary | ICD-10-CM | POA: Diagnosis not present

## 2022-07-22 DIAGNOSIS — E440 Moderate protein-calorie malnutrition: Secondary | ICD-10-CM | POA: Insufficient documentation

## 2022-07-22 LAB — T4, FREE: Free T4: 0.77 ng/dL (ref 0.61–1.12)

## 2022-07-22 LAB — BASIC METABOLIC PANEL
Anion gap: 9 (ref 5–15)
BUN: 24 mg/dL — ABNORMAL HIGH (ref 8–23)
CO2: 26 mmol/L (ref 22–32)
Calcium: 8.8 mg/dL — ABNORMAL LOW (ref 8.9–10.3)
Chloride: 101 mmol/L (ref 98–111)
Creatinine, Ser: 1.37 mg/dL — ABNORMAL HIGH (ref 0.61–1.24)
GFR, Estimated: 53 mL/min — ABNORMAL LOW (ref >60)
Glucose, Bld: 107 mg/dL — ABNORMAL HIGH (ref 70–99)
Potassium: 3.5 mmol/L (ref 3.5–5.1)
Sodium: 136 mmol/L (ref 135–145)

## 2022-07-22 LAB — CBC
HCT: 34.6 % — ABNORMAL LOW (ref 39.0–52.0)
Hemoglobin: 12.1 g/dL — ABNORMAL LOW (ref 13.0–17.0)
MCH: 32.7 pg (ref 26.0–34.0)
MCHC: 35 g/dL (ref 30.0–36.0)
MCV: 93.5 fL (ref 80.0–100.0)
Platelets: 181 10*3/uL (ref 150–400)
RBC: 3.7 MIL/uL — ABNORMAL LOW (ref 4.22–5.81)
RDW: 14.9 % (ref 11.5–15.5)
WBC: 5.3 10*3/uL (ref 4.0–10.5)
nRBC: 0 % (ref 0.0–0.2)

## 2022-07-22 LAB — URINE CULTURE

## 2022-07-22 LAB — TSH: TSH: 33.136 u[IU]/mL — ABNORMAL HIGH (ref 0.350–4.500)

## 2022-07-22 MED ORDER — LEVOTHYROXINE SODIUM 50 MCG PO TABS
50.0000 ug | ORAL_TABLET | Freq: Once | ORAL | Status: AC
  Administered 2022-07-22: 50 ug via ORAL
  Filled 2022-07-22: qty 1

## 2022-07-22 MED ORDER — GADOBUTROL 1 MMOL/ML IV SOLN
10.0000 mL | Freq: Once | INTRAVENOUS | Status: AC | PRN
  Administered 2022-07-22: 10 mL via INTRAVENOUS

## 2022-07-22 MED ORDER — LEVOTHYROXINE SODIUM 100 MCG/5ML IV SOLN: 12.5000 ug | Freq: Once | INTRAVENOUS | Status: DC

## 2022-07-22 MED ORDER — SODIUM CHLORIDE 0.9 % IV SOLN: INTRAVENOUS | Status: AC

## 2022-07-22 MED ORDER — ENSURE ENLIVE PO LIQD
237.0000 mL | ORAL | Status: DC
  Administered 2022-07-22: 237 mL via ORAL

## 2022-07-22 MED ORDER — METOPROLOL TARTRATE 25 MG PO TABS
25.0000 mg | ORAL_TABLET | Freq: Two times a day (BID) | ORAL | Status: DC
  Administered 2022-07-22 – 2022-07-23 (×2): 25 mg via ORAL
  Filled 2022-07-22 (×3): qty 1

## 2022-07-22 MED ORDER — ADULT MULTIVITAMIN W/MINERALS CH
1.0000 | ORAL_TABLET | Freq: Every day | ORAL | Status: DC
  Administered 2022-07-22 – 2022-07-23 (×2): 1 via ORAL
  Filled 2022-07-22 (×2): qty 1

## 2022-07-22 MED ORDER — LEVOTHYROXINE SODIUM 25 MCG PO TABS
125.0000 ug | ORAL_TABLET | Freq: Every day | ORAL | Status: DC
  Administered 2022-07-23: 125 ug via ORAL
  Filled 2022-07-22: qty 1

## 2022-07-22 NOTE — Progress Notes (Signed)
Pt place on contact for ESBL, per infection prevention. Pt aware and understood the protocol.

## 2022-07-22 NOTE — Progress Notes (Signed)
Initial Nutrition Assessment  DOCUMENTATION CODES:   Non-severe (moderate) malnutrition in context of chronic illness  INTERVENTION:  Liberalize diet to regular to promote adequate PO intake Ensure Enlive po 1x/d, each supplement provides 350 kcal and 20 grams of protein. MVI with minerals  NUTRITION DIAGNOSIS:  Moderate Malnutrition (in the setting of chronic illness) related to  (inadequate energy intake) as evidenced by moderate fat depletion, mild muscle depletion.  GOAL:  Patient will meet greater than or equal to 90% of their needs  MONITOR:  PO intake, Supplement acceptance, I & O's  REASON FOR ASSESSMENT:  Consult  (nutritional goals)  ASSESSMENT:   Pt with hx of HTN, HLD, CAD, CHF, atrial fibrillation, hypothyroidism, and lymphoma presented to ED after a fall at home. Pt has had recurrent UTIs resulting in falls over the last few months.  Pt resting in bedside chair at the time of assessment. Lunch tray well consumed at bedside. Pt reports a good appetite, but that he has lost weight over the last few months. Pt was supposed to start radiation yesterday for his lymphoma, has completed chemotherapy.  Encouraged good PO intake and the use of nutrition supplements to discourage further muscle loss.   Nutritionally Relevant Medications: Scheduled Meds:  alfuzosin  10 mg Oral QHS   atorvastatin  80 mg Oral QHS   docusate sodium  100 mg Oral BID   ezetimibe  10 mg Oral QHS   pantoprazole  40 mg Oral Daily   Labs Reviewed: BUN 24, creatinine 1.37  NUTRITION - FOCUSED PHYSICAL EXAM: Flowsheet Row Most Recent Value  Orbital Region Moderate depletion  Upper Arm Region Severe depletion  Thoracic and Lumbar Region Mild depletion  Buccal Region Moderate depletion  Temple Region Mild depletion  Clavicle Bone Region Mild depletion  Clavicle and Acromion Bone Region Moderate depletion  Scapular Bone Region Mild depletion  Dorsal Hand Mild depletion  Patellar Region No  depletion  Anterior Thigh Region No depletion  Posterior Calf Region No depletion  Edema (RD Assessment) None  Hair Reviewed  Eyes Reviewed  Mouth Reviewed  Skin Reviewed  Nails Reviewed    Diet Order:   Diet Order             Diet Heart Room service appropriate? Yes; Fluid consistency: Thin  Diet effective now                   EDUCATION NEEDS:  Education needs have been addressed  Skin:  Skin Assessment: Reviewed RN Assessment  Last BM:  10/12  Height:  Ht Readings from Last 1 Encounters:  07/20/22 '6\' 4"'$  (1.93 m)    Weight:  Wt Readings from Last 1 Encounters:  07/20/22 114.3 kg    Ideal Body Weight:  91.8 kg  BMI:  Body mass index is 30.67 kg/m.  Estimated Nutritional Needs:  Kcal:  2400-2600 kcal/d Protein:  120-140 g/d Fluid:  2.5-2.6 L/d    Ranell Patrick, RD, LDN Clinical Dietitian RD pager # available in [place]  After hours/weekend pager # available in [facility]

## 2022-07-22 NOTE — Consult Note (Signed)
Palliative Care Consult Note                                  Date: 07/22/2022   Patient Name: [name]  DOB: [birth year]  MRN: [id number]  Age / Sex: 76 y.o., male  PCP: Plotnikov, Evie Lacks, MD Referring Physician: Caren Griffins, MD  Reason for Consultation: Establishing goals of care  HPI/Patient Profile: 76 y.o. male  with past medical history of A-fib on Xarelto, CAD, CKD stage IIIa, chronic diastolic CHF, B-cell lymphoma, oh OSA on CPAP, HTN, HLD, BPH, and hypothyroidism who presented to [facility] ED on 07/20/2022 with recurrent falls.  Found to have orthostatic hypotension and possible dehydration.  Admitted to [facility]. Palliative Medicine was consulted for goals of care.   Past Medical History:  Diagnosis Date   Benign localized prostatic hyperplasia with lower urinary tract symptoms (LUTS)    CAD (coronary artery disease) cardiologist--  dr t. Oval Linsey   hx of known CAD obstruction w/ collaterals (cath done @ [facility] 12-31-2011) ;   last cardiac cath 08-13-2019  showed sig. 2V CAD involving proxLAD and CTO of the RCA/  chronic total occlusion midRCA w/ bridging and L>R collaterals   Diastolic CHF ([place])    dx 80-32-1224 hospital admission (followed by pcp)   Diffuse large B cell lymphoma [facility]) oncologist-- dr Irene Limbo--- in remission   dx 08/ 2019 -- bx left tonsill mass-- involving lymph nodes--- completed chemotherapy 10-12-2018   DJD (degenerative joint disease)    Dysthymic disorder    Environmental allergies    GAD (generalized anxiety disorder)    with dysthymia   GERD (gastroesophageal reflux disease)    History of falling    multiple times   History of kidney stones    HLD (hyperlipidemia)    Hyperlipidemia    Hypertension    Hypogonadism in male    OA (osteoarthritis)    OSA on CPAP    cpap machine-settings 17   Osteoporosis with fracture 04/24/2013   Persistent atrial fibrillation ([place])    On Xarelto,  cardiologist--- dr Alfonso Patten. [place]   Plaque psoriasis    dermatologist--- dr Harriett Sine--- currently taking otezla   Post-surgical hypothyroidism    followed by pcp---  s/p total thyroidectomy for graves disease in 1987   Wears hearing aid in both ears     Subjective:   I have reviewed medical records including progress notes, labs and imaging, and met with patient at bedside to discuss diagnosis, prognosis, GOC, disposition, and options.  I introduced Palliative Medicine as specialized medical care for people living with serious illness. It focuses on providing relief from the symptoms and stress of a serious illness.   A brief life review was discussed.  [name] and his husband [name] have been married for 42 years. 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 He was also closely involved with the program of English as a Second Language (ESL), and even presented his work to the department of education in [place].   [name] reports that his first fall was 10 years ago and that it was a life changing event because it resulted in serious injuries.  He had a fractured right femur and a fractured right  arm.  He went to the community hospital in [facility] and underwent surgical repair of the fractures.  He feels that the orthopedic surgeon made errors during the procedure that negatively impacted his recovery.  He was not able to walk for many months after the fall.  He also had significantly decreased fine motor skills in his right arm/hand, and has been unable to play the piano.   We discussed patient's current medical condition and what it means in the larger context of his ongoing co-morbidities. Discussed his issues with recurrent falls and recurrent UTIs.    We also discussed his diagnosis of B cell  lymphoma with relapse in April 2023. [name] expresses being depressed when he learned the cancer had worsened, but has come to accept it. He was recently treated with 3 cycles of salvage chemotherapy and states he was scheduled to start palliative radiation this week. His last office with with Dr. Irene Limbo was 05/31/22. They discussed the PET scan 05/19/22 that showed partial response to treatment.   Current clinical status was reviewed. [name] reported some concern about having slurred speech, so plan is for MRI brain to evaluate for stroke.   Discussed recommendation from PT/OT for home PT services. [name] is hopeful to discharge today or tomorrow. He does not want to be in the hospital any longer than necessary.   Created space and opportunity for [name] to explore thoughts and feelings regarding his current medical situation. He reflects that he has had a wonderful life with [name] and that he would not change anything.  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  A brief discussion was had regarding advanced directives. The MOST form was introduced and discussed.  I provided [name] with a blank copy of the advance directives packet as well as a blank MOST form.  Questions and concerns addressed. Patient encouraged to call with questions or concerns.     Review of Systems  All other systems reviewed and are negative.   Objective:   Primary Diagnoses: Present on Admission:  Orthostatic hypotension  Stage 3a chronic kidney disease (CKD) (HCC) - Baseline Scr 1.5 - 1.6  Paroxysmal atrial fibrillation (HCC)  OSA (obstructive sleep apnea)  Hypothyroidism  Diffuse large B-cell lymphoma (HCC)  BPH with obstruction/lower urinary tract symptoms  CAD (coronary artery disease)  Dyslipidemia  Chronic diastolic heart failure ([place])  DNR (do not resuscitate)/DNI(Do Not Intubate)   Physical Exam Vitals reviewed.  Constitutional:      General:  He is not in acute distress.    Comments: Chronically ill-appearing  Pulmonary:     Effort: Pulmonary effort is normal.  Neurological:     Mental Status: He is alert and oriented to person, place, and time.  Psychiatric:        Behavior: Behavior normal.     Vital Signs:  BP 115/80 (BP Location: Left Arm)   Pulse 83   Temp 98.1 F (36.7 C) (Oral)   Resp 18   Ht '6\' 4"'  (1.93 m)   Wt 114.3 kg   SpO2 96%   BMI 30.67 kg/m   Palliative Assessment/Data: PPS 60%     Assessment & Plan:   SUMMARY OF RECOMMENDATIONS   DNR/DNI as previously documented Continue current interventions Goal of care is to return home and start palliative radiation PMT will continue to follow  Primary Decision Maker: PATIENT   Symptom Management:  Hydrocodone-acetaminophen 7.5-325 mg every 6 hours as needed  for pain (home med)  Prognosis:  Unable to determine  Discharge Planning:  Home with Home Health     Thank you for allowing [place] to participate in the care of [place]   Signed by: Elie Confer, NP Palliative Medicine Team  Team Phone # [phone]  For individual providers, please see AMION

## 2022-07-22 NOTE — TOC Initial Note (Addendum)
Transition of Care [facility]) - Initial/Assessment Note    Patient Details  Name: [name] MRN: [id number] Date of Birth: 1946/01/28  [facility] CM/SW Contact:    Marilu Favre, RN Phone Number: 07/22/2022, 12:20 PM  Clinical Narrative:                  Patient from home with husband . Confirmed face sheet information.   Active with [facility] for HHRN,PT,OT, patient would like to continue services with [facility] and requesting same RN,PT,OT.   Entered resumption of care orders    Patient has walker, wheel chair, lift chair and hand rails in bathroom  Expected Discharge Plan: [place] Barriers to Discharge: Continued Medical Work up   Patient Goals and CMS Choice Patient states their goals for this hospitalization and ongoing recovery are:: to return to home CMS Medicare.gov Compare Post Acute Care list provided to:: Patient Choice offered to / list presented to : Patient  Expected Discharge Plan and Services Expected Discharge Plan: [place]   Discharge Planning Services: CM Consult Post Acute Care Choice: [place] arrangements for the past 2 months: Single Family Home                 DME Arranged: N/A DME Agency: NA       HH Arranged: RN, PT, OT HH Agency: [place] Date [place]: 07/22/22 Time HH Agency Contacted: 1219 Representative spoke with at [place]: Tommi Rumps  Prior Living Arrangements/Services Living arrangements for the past 2 months: [place] Lives with:: Spouse Patient language and need for interpreter reviewed:: Yes Do you feel safe going back to the place where you live?: Yes      Need for Family Participation in Patient Care: Yes (Comment) Care giver support system in place?: Yes (comment) Current home services: DME Criminal Activity/Legal Involvement Pertinent to Current Situation/Hospitalization: No - Comment as needed  Activities of  Daily Living Home Assistive Devices/Equipment: None ADL Screening (condition at time of admission) Patient's cognitive ability adequate to safely complete daily activities?: Yes Is the patient deaf or have difficulty hearing?: No Does the patient have difficulty seeing, even when wearing glasses/contacts?: No Does the patient have difficulty concentrating, remembering, or making decisions?: No Patient able to express need for assistance with ADLs?: Yes Does the patient have difficulty dressing or bathing?: No Independently performs ADLs?: Yes (appropriate for developmental age) Does the patient have difficulty walking or climbing stairs?: No Weakness of Legs: None Weakness of Arms/Hands: None  Permission Sought/Granted   Permission granted to share information with : No              Emotional Assessment Appearance:: Appears stated age Attitude/Demeanor/Rapport: Engaged Affect (typically observed): Accepting Orientation: : Oriented to Self, Oriented to Place, Oriented to  Time, Oriented to Situation Alcohol / Substance Use: Not Applicable Psych Involvement: No (comment)  Admission diagnosis:  Orthostatic hypotension [I95.1] Fall, initial encounter [W19.XXXA] Patient Active Problem List   Diagnosis Date Noted   Recurrent UTI 06/02/2022   Diffuse large B cell lymphoma ([place]) 05/24/2022   Recurrent falls 05/20/2022   Orthostatic hypotension 04/27/2022   Sepsis secondary to UTI ([place]) 04/22/2022   Encounter for antineoplastic chemotherapy    AKI (acute kidney injury) ([place]) 03/19/2022   DNR (do not resuscitate)/DNI(Do Not Intubate) 03/19/2022   Lung mass 03/06/2022   Stage 3a chronic kidney disease (CKD) (HCC) - Baseline Scr  1.5 - 1.6 03/06/2022   Allergic rhinitis 01/25/2022   Allergic rhinitis due to animal (cat) (dog) hair and dander 01/25/2022   Allergic rhinitis due to pollen 01/25/2022   Mild intermittent asthma 01/25/2022   Vasomotor rhinitis 01/25/2022   Multiple skin  nodules 01/25/2022   Weight loss 01/25/2022   Pure hypercholesterolemia 02/10/2021   Chronic diastolic heart failure ([place]) 06/21/2019   H/O nephrolithotomy with removal of calculi 03/26/2019   Renal calculus 03/26/2019   History of chemotherapy 11/21/2018   Lymphoma in remission ([place]) 11/21/2018   Generalized weakness 11/08/2018   Well adult exam 10/02/2018   Counseling regarding advance care planning and goals of care 07/09/2018   Diffuse large B-cell lymphoma ([place]) 06/23/2018   Port-A-Cath in place 06/23/2018   Hypogonadism in male 11/22/2017   Nephrolithiasis 10/24/2017   Recurrent dislocation of left patella 07/15/2017   Recurrent subluxation of patella, left 07/08/2017   At high risk for falls 05/17/2017   S/P revision of total knee 04/11/2017   Failed total knee, left, initial encounter ([place]) 04/09/2017   Renal calculus, right 02/08/2017   Protein-calorie malnutrition, severe ([place]) 12/08/2016   Chronic obstructive pulmonary disease ([place]) 12/08/2016   [place] renal calculus 11/05/2016   Senile osteoporosis 06/08/2016   Dyslipidemia 01/12/2016   Insomnia secondary to depression with anxiety 12/02/2015   Generalized anxiety disorder 11/19/2015   Wound drainage right hip 11/15/2013   Expected blood loss anemia 09/18/2013   S/P right TH revision 09/17/2013   BPH with obstruction/lower urinary tract symptoms 08/03/2013   History of gastroesophageal reflux (GERD) 08/03/2013   OSA (obstructive sleep apnea) 08/03/2013   Paroxysmal atrial fibrillation ([place]) 08/03/2013   Presence of unspecified artificial hip joint 08/03/2013   Nonunion, fracture, Right tibia  07/03/2013   UTI (urinary tract infection) 07/03/2013   Chronic anticoagulation 02/21/2013   Fall 02/08/2013   Physical deconditioning 02/08/2013   Low back pain radiating to both legs 02/08/2013   Compression fracture of L1 lumbar vertebra ([place]) 02/08/2013   Morbid obesity ([place]) 02/05/2013   Anemia 10/06/2012    Essential hypertension 10/03/2012   Psoriasis 10/03/2012   Vitamin D deficiency 10/03/2012   GERD (gastroesophageal reflux disease) 10/03/2012   Anxiety 10/03/2012   Depression 10/03/2012   CAD (coronary artery disease) 10/03/2012   DJD (degenerative joint disease) 10/03/2012   Chronic gingivitis 10/03/2012   Low testosterone, possible hypogonadism 10/01/2012   Normocytic anemia 09/30/2012   Longstanding persistent atrial fibrillation ([place]) 09/29/2012   Hypothyroidism 09/29/2012   History of recurrent UTIs 09/29/2012   PCP:  Cassandria Anger, MD Pharmacy:   [id number] [place], [place]. [place] [address] Phone: [phone] Fax: [place], [place] [address] Phone: [phone] Fax: [phone]  [place], [place] [address] Phone: [phone] Fax: [phone]  [place] [address] Phone: [phone] Fax: [phone]     Social Determinants of Health (SDOH) Interventions    Readmission Risk Interventions    04/27/2022    9:56 AM 04/26/2022    6:28 PM 03/22/2022   10:07 AM  Readmission Risk Prevention Plan  Transportation Screening  Complete Complete  PCP or Specialist Appt within 3-5 Days   Complete  HRI or [place]  Complete  Social Work Consult for [place] Planning/Counseling   Complete  Palliative Care Screening   Complete  Medication Review Press photographer)  Referral to Pharmacy Complete  PCP or Specialist appointment within 3-5 days of discharge Complete    HRI or [place]  Complete   SW Recovery Care/Counseling Consult Complete    [place]  Not  Applicable

## 2022-07-22 NOTE — Progress Notes (Signed)
Physical Therapy Treatment Patient Details Name: [name] MRN: [id number] DOB: [birth year] Today's Date: 07/22/2022   History of Present Illness Pt is a 76 y/o male who presented with recurrent falls at home in setting of orthostatic hypotension. Pt recently admitted 9/1-9/7 for a fall at home. PMH: a fib, BPH; CAD; chronic diastolic CHF; B cell lymphoma; HTN; HLD; OSA on CPAP; plaque psoriasis; and hypothyroidism    PT Comments    Pt required supervision supine to sit, min guard assist sit to stand, and min assist +2 safety/chair follow amb 5' with RW. Unsteady gait with BLE weakness. Distance limited by fatigue. Pt returned to room in recliner. No issues with orthostasis. Pt in recliner with feet elevated at end of session.                                            BP Supine                           113/68 Sit                                  106/68 Initial stand                   97/72 Stand after 3 min          109/60 In recliner after amb     109/75        Recommendations for follow up therapy are one component of a multi-disciplinary discharge planning process, led by the attending physician.  Recommendations may be updated based on patient status, additional functional criteria and insurance authorization.  Follow Up Recommendations  Home health PT     Assistance Recommended at Discharge Frequent or constant Supervision/Assistance  Patient can return home with the following A little help with walking and/or transfers;A little help with bathing/dressing/bathroom;Assistance with cooking/housework;Assist for transportation;Help with stairs or ramp for entrance   Equipment Recommendations  None recommended by PT    Recommendations for Other Services       Precautions / Restrictions Precautions Precautions: Fall;Other (comment) Precaution Comments: monitor orthostatics, urinary urgency Restrictions Weight Bearing Restrictions: No     Mobility  Bed  Mobility Overal bed mobility: Needs Assistance Bed Mobility: Supine to Sit     Supine to sit: Supervision     General bed mobility comments: +rail, increased time    Transfers Overall transfer level: Needs assistance Equipment used: Rolling walker (2 wheels) Transfers: Sit to/from Stand Sit to Stand: Min guard           General transfer comment: min guard for safety, increased time to power up    Ambulation/Gait Ambulation/Gait assistance: +2 safety/equipment, Min assist Gait Distance (Feet): 80 Feet Assistive device: Rolling walker (2 wheels) Gait Pattern/deviations: Step-through pattern, Decreased stride length Gait velocity: decreased Gait velocity interpretation: <1.8 ft/sec, indicate of risk for recurrent falls   General Gait Details: unsteady gait, BLE weakness, +2 for chair follow/safety, distance limited by fatigue   Stairs             Wheelchair Mobility    Modified Rankin (Stroke Patients Only)       Balance Overall balance assessment: Needs assistance, History of Falls Sitting-balance support: No upper extremity supported, Feet supported  Sitting balance-Leahy Scale: Good     Standing balance support: Bilateral upper extremity supported, Reliant on assistive device for balance, During functional activity Standing balance-Leahy Scale: Poor                              Cognition Arousal/Alertness: Awake/alert Behavior During Therapy: WFL for tasks assessed/performed Overall Cognitive Status: Within Functional Limits for tasks assessed                                          Exercises General Exercises - Lower Extremity Hip Flexion/Marching: AROM, Right, Left, 5 reps, Standing Heel Raises: AROM, Both, 5 reps, Standing    General Comments General comments (skin integrity, edema, etc.): decrease in BP with initial stance but pt assymptomatic      Pertinent Vitals/Pain Pain Assessment Pain Assessment:  No/denies pain    Home Living                          Prior Function            PT Goals (current goals can now be found in the care plan section) Acute Rehab PT Goals Patient Stated Goal: to go home Progress towards PT goals: Progressing toward goals    Frequency    Min 3X/week      PT Plan Current plan remains appropriate    Co-evaluation              AM-PAC PT "6 Clicks" Mobility   Outcome Measure  Help needed turning from your back to your side while in a flat bed without using bedrails?: None Help needed moving from lying on your back to sitting on the side of a flat bed without using bedrails?: A Little Help needed moving to and from a bed to a chair (including a wheelchair)?: A Little Help needed standing up from a chair using your arms (e.g., wheelchair or bedside chair)?: A Little Help needed to walk in hospital room?: A Little Help needed climbing 3-5 steps with a railing? : A Lot 6 Click Score: 18    End of Session Equipment Utilized During Treatment: Gait belt Activity Tolerance: Patient tolerated treatment well Patient left: in chair;with call bell/phone within reach;with chair alarm set Nurse Communication: Mobility status PT Visit Diagnosis: Repeated falls (R29.6);Unsteadiness on feet (R26.81);Muscle weakness (generalized) (M62.81)     Time: 7035-0093 PT Time Calculation (min) (ACUTE ONLY): 25 min  Charges:  $Gait Training: 23-37 mins                     [name], [place]  Office # [phone] Pager [phone]    [name] 07/22/2022, 10:45 AM

## 2022-07-22 NOTE — Progress Notes (Signed)
Patient does not wish to use CPAP.  Patient stable on room air at this time.

## 2022-07-22 NOTE — TOC CAGE-AID Note (Signed)
Transition of Care [facility]) - CAGE-AID Screening   Patient Details  Name: [name] MRN: [id number] Date of Birth: [birth year]  [facility]) CM/SW Contact:    Army Melia, RN Phone Number:[phone] 07/22/2022, 6:31 AM   Clinical Narrative:  No hx drug/alcohol use, no resources indicated.   CAGE-AID Screening:    Have You Ever Felt You Ought to Cut Down on Your Drinking or Drug Use?: No Have People Annoyed You By Critizing Your Drinking Or Drug Use?: No Have You Felt Bad Or Guilty About Your Drinking Or Drug Use?: No Have You Ever Had a Drink or Used Drugs First Thing In The Morning to Steady Your Nerves or to Get Rid of a Hangover?: No CAGE-AID Score: 0  Substance Abuse Education Offered: No

## 2022-07-22 NOTE — Progress Notes (Signed)
PROGRESS NOTE  MYLAN SCHWARZ ZOX:[id number] DOB: 1945/12/09 DOA: 07/20/2022 PCP: Cassandria Anger, MD   LOS: 0 days   Brief Narrative / Interim history: 76 y.o. male with medical history significant of afib on Xarelto; BPH; CAD; chronic diastolic CHF; B cell lymphoma; HTN; HLD; OSA on CPAP; plaque psoriasis; and hypothyroidism presenting with recurrent falls.  He reports that it started 10 years ago when he had a fall with significant injuries.  He has had ongoing issues since with recurrent falls.  He has been falling more recently.  He had 3 falls overnight and his husband called 911.  He was recently told that he should stand still for a minute after standing, before starting to walk; he is not sure if he is feeling dizzy when standing though.  Subjective / 24h Interval events: Doing well today, still unsteady on his feet  Assesement and Plan: Principal Problem:   Recurrent falls Active Problems:   Hypothyroidism   Diffuse large B-cell lymphoma (HCC)   Chronic diastolic heart failure (HCC)   BPH with obstruction/lower urinary tract symptoms   Paroxysmal atrial fibrillation (HCC)   Stage 3a chronic kidney disease (CKD) (HCC) - Baseline Scr 1.5 - 1.6   DNR (do not resuscitate)/DNI(Do Not Intubate)   CAD (coronary artery disease)   Dyslipidemia   OSA (obstructive sleep apnea)   Orthostatic hypotension   Malnutrition of moderate degree   Principal problem Orthostatic hypotension, recurrent falls-this is likely multifactorial, perhaps slightly dehydrated.  Orthostatics are better but still dips when working with PT.  Provide IV fluid overnight and reassess in the morning.  He has been taking diltiazem as well as metoprolol, placed on metoprolol alone and hold diltiazem.  Metoprolol is less likely to have an effect on his blood pressure -Patient also concerned about patient having slurred speech at times, obtain an MRI of the brain to rule out metastatic disease but also CVA.   Currently without any neurological deficits  Active problems Hypothyroidism-patient's husband, [name], tells me that he used to be on 200 mcg Synthroid.  I see that TSH was suppressed, and he was dropped to 100 mcg.  TSH currently is elevated significantly at 33, which can also contribute.  Increase Synthroid to 125, he will need to have it rechecked as an outpatient in 3 to 4 weeks.  He takes it correctly, first thing in the morning and at least 2 hours before his other meds or breakfast  B-cell lymphoma-follows with oncology as an outpatient.  Has recurrence and is being considered for palliative radiation  PAF-need to consider whether to resume, will discuss with patient and spouse  CAD, chronic diastolic CHF-appears compensated  Hyperlipidemia-continue atorvastatin, Zetia  CKD 3A-appears at baseline  OSA-continue CPAP  Psoriasis -On Skyrizi at home  Chronic pain-continue home medications  Scheduled Meds:  alfuzosin  10 mg Oral QHS   allopurinol  100 mg Oral BID   atorvastatin  80 mg Oral QHS   buPROPion  300 mg Oral Daily   docusate sodium  100 mg Oral BID   DULoxetine  60 mg Oral BID   enoxaparin (LOVENOX) injection  40 mg Subcutaneous Q24H   ezetimibe  10 mg Oral QHS   [START ON 07/23/2022] levothyroxine  125 mcg Oral Q0600   loratadine  10 mg Oral Daily   metoprolol tartrate  25 mg Oral BID   mirabegron ER  25 mg Oral QHS   pantoprazole  40 mg Oral Daily   sodium chloride flush  3 mL Intravenous Q12H   Continuous Infusions: PRN Meds:.acetaminophen **OR** acetaminophen, albuterol, bisacodyl, clonazePAM, hydrALAZINE, HYDROcodone-acetaminophen, ondansetron **OR** ondansetron (ZOFRAN) IV, polyethylene glycol  Current Outpatient Medications  Medication Instructions   acetaminophen (TYLENOL) 650 mg, Oral, Every 6 hours PRN   albuterol (VENTOLIN HFA) 108 (90 Base) MCG/ACT inhaler 2 puffs, Inhalation, Every 6 hours PRN   alfuzosin (UROXATRAL) 10 mg, Oral, Daily at bedtime    allopurinol (ZYLOPRIM) 100 mg, Oral, 2 times daily   ascorbic acid (VITAMIN C) 500 mg, Oral, Daily with lunch   atorvastatin (LIPITOR) 80 mg, Oral, Daily at bedtime   buPROPion (WELLBUTRIN XL) 300 MG 24 hr tablet TAKE 1 TABLET EVERY DAY WITH BREAKFAST   Calcium Citrate-Vitamin D (CALCIUM CITRATE + D PO) 1 tablet, Oral, 2 times daily, '1200mg'$  of Calcium and 1000 units vitamin D3   clonazePAM (KLONOPIN) 1 mg, Oral, 3 times daily PRN   CRANBERRY PO 2 tablets, Oral, Every morning   denosumab (PROLIA) 60 mg, Injection, Every 6 months   diltiazem (CARDIZEM CD) 120 mg, Oral, Daily   DULoxetine (CYMBALTA) 60 MG capsule TAKE 1 CAPSULE TWICE DAILY   EPINEPHrine (EPI-PEN) 0.3 mg, Intramuscular, Once PRN   ezetimibe (ZETIA) 10 mg, Oral, Daily at bedtime   feeding supplement (ENSURE ENLIVE / ENSURE PLUS) LIQD 237 mLs, Oral, 2 times daily between meals   ferrous sulfate 325 mg, Oral, 2 times daily with meals   HYDROcodone-acetaminophen (NORCO) 7.5-325 MG tablet 1 tablet, Oral, Every 6 hours PRN   levothyroxine (SYNTHROID) 100 mcg, Oral, Daily before breakfast   lidocaine-prilocaine (EMLA) cream 1 Application, Topical, As needed   loratadine (CLARITIN) 10 mg, Oral, Daily   Magnesium 500 mg, Oral, Daily   Menthol, Topical Analgesic, (BIOFREEZE) 10 % CREA 1 Application, Topical, Daily PRN   metoprolol tartrate (LOPRESSOR) 25 mg, Oral, 2 times daily   Multiple Vitamin (MULTIVITAMIN WITH MINERALS) TABS tablet 1 tablet, Oral, Every morning, Men's One-A-Day 50+   MYRBETRIQ 25 MG TB24 tablet TAKE 1 TABLET EVERY DAY   nitroGLYCERIN (NITROSTAT) 0.4 mg, Sublingual, Every 5 min PRN   omeprazole (PRILOSEC) 40 MG capsule TAKE 1 CAPSULE EVERY DAY BEFORE BREAKFAST   Potassium Citrate 15 MEQ (1620 MG) TBCR 1 tablet, Oral, See admin instructions, Take one tablet (15 meq) by mouth twice daily - with lunch and supper   Probiotic Product (PROBIOTIC PO) 1 capsule, Oral, Daily with breakfast   Risankizumab-rzaa (SKYRIZI,  150 MG DOSE, [place]) 150 mg, Subcutaneous, Every 4 months   rivaroxaban (XARELTO) 20 mg, Oral, Daily with supper   solifenacin (VESICARE) 10 mg, Oral, Daily   testosterone enanthate (DELATESTRYL) 200 MG/ML injection INJECT 1ML ('200MG'$ ) INTO THE MUSCLE EVERY 14 DAYS. DISCARD VIAL AFTER 28 DAYS.   tiZANidine (ZANAFLEX) 4 mg, Oral, Daily PRN   Vitamin D 4,000 Units, Oral, Daily    Diet Orders (From admission, onward)     Start     Ordered   07/21/22 0913  Diet Heart Room service appropriate? Yes; Fluid consistency: Thin  Diet effective now       Question Answer Comment  Room service appropriate? Yes   Fluid consistency: Thin      07/21/22 0913            DVT prophylaxis: enoxaparin (LOVENOX) injection 40 mg Start: 07/21/22 1000   Lab Results  Component Value Date   PLT 181 07/22/2022      Code Status: DNR  Family Communication: d/w [name] over the phone  Status is: Observation  The patient will require care spanning > 2 midnights and should be moved to inpatient because: Needs MRI Calmer IV fluids, I do anticipate discharge tomorrow   Level of care: Telemetry Medical  Consultants:  none  Objective: Vitals:   07/22/22 0600 07/22/22 0827 07/22/22 1142 07/22/22 1145  BP: 117/64 121/62 115/80   Pulse: 79 90 (!) 121 83  Resp: '18 16 18   '$ Temp: 98 F (36.7 C) 98.5 F (36.9 C) 98.1 F (36.7 C)   TempSrc: Oral Oral Oral   SpO2: 95% 95% 95% 96%  Weight:      Height:        Intake/Output Summary (Last 24 hours) at 07/22/2022 1446 Last data filed at 07/22/2022 1142 Gross per 24 hour  Intake --  Output 200 ml  Net -200 ml   Wt Readings from Last 3 Encounters:  07/20/22 114.3 kg  06/30/22 115.2 kg  06/12/22 114.3 kg    Examination:  Constitutional: NAD Eyes: no scleral icterus ENMT: Mucous membranes are moist.  Neck: normal, supple Respiratory: clear to auscultation bilaterally, no wheezing, no crackles. Normal respiratory effort. No accessory muscle use.   Cardiovascular: Regular rate and rhythm, no murmurs / rubs / gallops. No LE edema.  Abdomen: non distended, no tenderness. Bowel sounds positive.  Musculoskeletal: no clubbing / cyanosis.   Data Reviewed: I have independently reviewed following labs and imaging studies   CBC Recent Labs  Lab 07/20/22 2347 07/21/22 0017 07/22/22 0217  WBC  --  6.5 5.3  HGB 13.3 12.2* 12.1*  HCT 39.0 36.8* 34.6*  PLT  --  197 181  MCV  --  98.1 93.5  MCH  --  32.5 32.7  MCHC  --  33.2 35.0  RDW  --  14.8 14.9  LYMPHSABS  --  1.2  --   MONOABS  --  0.6  --   EOSABS  --  0.1  --   BASOSABS  --  0.0  --     Recent Labs  Lab 07/20/22 2347 07/21/22 0017 07/22/22 0217  NA 137 134* 136  K 3.9 4.1 3.5  CL 99 99 101  CO2  --  26 26  GLUCOSE 113* 114* 107*  BUN 28* 29* 24*  CREATININE 1.40* 1.42* 1.37*  CALCIUM  --  9.8 8.8*  AST  --  23  --   ALT  --  15  --   ALKPHOS  --  83  --   BILITOT  --  0.8  --   ALBUMIN  --  2.5*  --   LATICACIDVEN  --  1.2  --   TSH  --   --  33.136*    ------------------------------------------------------------------------------------------------------------------ No results for input(s): "CHOL", "HDL", "LDLCALC", "TRIG", "CHOLHDL", "LDLDIRECT" in the last 72 hours.  Lab Results  Component Value Date   HGBA1C 5.8 (H) 02/06/2013   ------------------------------------------------------------------------------------------------------------------ Recent Labs    07/22/22 0217  TSH 33.136*    Cardiac Enzymes No results for input(s): "CKMB", "TROPONINI", "MYOGLOBIN" in the last 168 hours.  Invalid input(s): "CK" ------------------------------------------------------------------------------------------------------------------    Component Value Date/Time   BNP 124.1 (H) 05/19/2021 0845    CBG: Recent Labs  Lab 07/21/22 0015  GLUCAP 107*    Recent Results (from the past 240 hour(s))  Urine Culture     Status: Abnormal   Collection Time:  07/20/22 11:42 PM   Specimen: Urine, Clean Catch  Result Value Ref Range Status   Specimen Description URINE, CLEAN CATCH  Final   Special Requests   Final    NONE Performed at [facility], [place] [address], [place], [place] [address]    Culture MULTIPLE SPECIES PRESENT, SUGGEST RECOLLECTION (A)  Final   Report Status 07/22/2022 FINAL  Final     Radiology Studies: No results found.   Marzetta Board, MD, PhD [facility]  Between 7 am - 7 pm I am available, please contact me via Amion (for emergencies) or Securechat (non urgent messages)  Between 7 pm - 7 am I am not available, please contact night coverage MD/APP via Amion

## 2022-07-23 DIAGNOSIS — Z66 Do not resuscitate: Secondary | ICD-10-CM | POA: Diagnosis present

## 2022-07-23 DIAGNOSIS — N1831 Chronic kidney disease, stage 3a: Secondary | ICD-10-CM | POA: Diagnosis present

## 2022-07-23 DIAGNOSIS — E440 Moderate protein-calorie malnutrition: Secondary | ICD-10-CM | POA: Diagnosis present

## 2022-07-23 DIAGNOSIS — I130 Hypertensive heart and chronic kidney disease with heart failure and stage 1 through stage 4 chronic kidney disease, or unspecified chronic kidney disease: Secondary | ICD-10-CM | POA: Diagnosis present

## 2022-07-23 DIAGNOSIS — F39 Unspecified mood [affective] disorder: Secondary | ICD-10-CM | POA: Diagnosis present

## 2022-07-23 DIAGNOSIS — I951 Orthostatic hypotension: Secondary | ICD-10-CM | POA: Diagnosis present

## 2022-07-23 DIAGNOSIS — I5032 Chronic diastolic (congestive) heart failure: Secondary | ICD-10-CM | POA: Diagnosis present

## 2022-07-23 DIAGNOSIS — R296 Repeated falls: Secondary | ICD-10-CM | POA: Diagnosis present

## 2022-07-23 DIAGNOSIS — I4819 Other persistent atrial fibrillation: Secondary | ICD-10-CM | POA: Diagnosis present

## 2022-07-23 DIAGNOSIS — Z515 Encounter for palliative care: Secondary | ICD-10-CM | POA: Diagnosis not present

## 2022-07-23 DIAGNOSIS — N138 Other obstructive and reflux uropathy: Secondary | ICD-10-CM | POA: Diagnosis present

## 2022-07-23 DIAGNOSIS — N401 Enlarged prostate with lower urinary tract symptoms: Secondary | ICD-10-CM | POA: Diagnosis present

## 2022-07-23 DIAGNOSIS — Z6830 Body mass index (BMI) 30.0-30.9, adult: Secondary | ICD-10-CM | POA: Diagnosis not present

## 2022-07-23 DIAGNOSIS — E785 Hyperlipidemia, unspecified: Secondary | ICD-10-CM | POA: Diagnosis present

## 2022-07-23 DIAGNOSIS — Z7901 Long term (current) use of anticoagulants: Secondary | ICD-10-CM | POA: Diagnosis not present

## 2022-07-23 DIAGNOSIS — Z87891 Personal history of nicotine dependence: Secondary | ICD-10-CM | POA: Diagnosis not present

## 2022-07-23 DIAGNOSIS — M810 Age-related osteoporosis without current pathological fracture: Secondary | ICD-10-CM | POA: Diagnosis present

## 2022-07-23 DIAGNOSIS — C8330 Diffuse large B-cell lymphoma, unspecified site: Secondary | ICD-10-CM | POA: Diagnosis present

## 2022-07-23 DIAGNOSIS — E890 Postprocedural hypothyroidism: Secondary | ICD-10-CM | POA: Diagnosis present

## 2022-07-23 DIAGNOSIS — G4733 Obstructive sleep apnea (adult) (pediatric): Secondary | ICD-10-CM | POA: Diagnosis present

## 2022-07-23 DIAGNOSIS — G8929 Other chronic pain: Secondary | ICD-10-CM | POA: Diagnosis present

## 2022-07-23 DIAGNOSIS — I2510 Atherosclerotic heart disease of native coronary artery without angina pectoris: Secondary | ICD-10-CM | POA: Diagnosis present

## 2022-07-23 DIAGNOSIS — Z79890 Hormone replacement therapy (postmenopausal): Secondary | ICD-10-CM | POA: Diagnosis not present

## 2022-07-23 DIAGNOSIS — Z79899 Other long term (current) drug therapy: Secondary | ICD-10-CM | POA: Diagnosis not present

## 2022-07-23 LAB — COMPREHENSIVE METABOLIC PANEL
ALT: 16 U/L (ref 0–44)
AST: 20 U/L (ref 15–41)
Albumin: 2.6 g/dL — ABNORMAL LOW (ref 3.5–5.0)
Alkaline Phosphatase: 74 U/L (ref 38–126)
Anion gap: 8 (ref 5–15)
BUN: 21 mg/dL (ref 8–23)
CO2: 27 mmol/L (ref 22–32)
Calcium: 8.7 mg/dL — ABNORMAL LOW (ref 8.9–10.3)
Chloride: 102 mmol/L (ref 98–111)
Creatinine, Ser: 1.21 mg/dL (ref 0.61–1.24)
GFR, Estimated: >60 mL/min (ref >60)
Glucose, Bld: 86 mg/dL (ref 70–99)
Potassium: 3.5 mmol/L (ref 3.5–5.1)
Sodium: 137 mmol/L (ref 135–145)
Total Bilirubin: 0.9 mg/dL (ref 0.3–1.2)
Total Protein: 5.3 g/dL — ABNORMAL LOW (ref 6.5–8.1)

## 2022-07-23 LAB — CBC
HCT: 36.6 % — ABNORMAL LOW (ref 39.0–52.0)
Hemoglobin: 12.5 g/dL — ABNORMAL LOW (ref 13.0–17.0)
MCH: 32 pg (ref 26.0–34.0)
MCHC: 34.2 g/dL (ref 30.0–36.0)
MCV: 93.6 fL (ref 80.0–100.0)
Platelets: 182 10*3/uL (ref 150–400)
RBC: 3.91 MIL/uL — ABNORMAL LOW (ref 4.22–5.81)
RDW: 14.7 % (ref 11.5–15.5)
WBC: 5.1 10*3/uL (ref 4.0–10.5)
nRBC: 0 % (ref 0.0–0.2)

## 2022-07-23 LAB — MAGNESIUM: Magnesium: 1.3 mg/dL — ABNORMAL LOW (ref 1.7–2.4)

## 2022-07-23 LAB — PHOSPHORUS: Phosphorus: 2.9 mg/dL (ref 2.5–4.6)

## 2022-07-23 MED ORDER — MAGNESIUM SULFATE 4 GM/100ML IV SOLN
4.0000 g | Freq: Once | INTRAVENOUS | Status: DC
  Filled 2022-07-23: qty 100

## 2022-07-23 MED ORDER — MAGNESIUM SULFATE 2 GM/50ML IV SOLN
2.0000 g | Freq: Once | INTRAVENOUS | Status: AC
  Administered 2022-07-23: 2 g via INTRAVENOUS
  Filled 2022-07-23: qty 50

## 2022-07-23 MED ORDER — MAGNESIUM CHLORIDE 64 MG PO TBEC
1.0000 | DELAYED_RELEASE_TABLET | Freq: Every day | ORAL | Status: DC
  Filled 2022-07-23: qty 1

## 2022-07-23 MED ORDER — SODIUM CHLORIDE 0.9 % IV BOLUS
1000.0000 mL | Freq: Once | INTRAVENOUS | Status: AC
  Administered 2022-07-23: 1000 mL via INTRAVENOUS

## 2022-07-23 MED ORDER — LEVOTHYROXINE SODIUM 125 MCG PO TABS: 125.0000 ug | ORAL_TABLET | Freq: Every day | ORAL | 1 refills | Status: DC

## 2022-07-23 NOTE — Discharge Summary (Signed)
Physician Discharge Summary  RUTHERFORD [name] HQP:[id number] DOB: 1946/04/04 DOA: 07/20/2022  PCP: Cassandria Anger, MD  Admit date: 07/20/2022 Discharge date: 07/23/2022  Admitted From: home Disposition:  home  Recommendations for Outpatient Follow-up:  Follow up with PCP in 1-2 weeks  Home Health: PT Equipment/Devices: none  Discharge Condition: stable CODE STATUS: Full code Diet Orders (From admission, onward)     Start     Ordered   07/22/22 1451  Diet regular Room service appropriate? Yes; Fluid consistency: Thin  Diet effective now       Question Answer Comment  Room service appropriate? Yes   Fluid consistency: Thin      07/22/22 1451            HPI: Per admitting MD, [name] is a 76 y.o. male with medical history significant of afib on Xarelto; BPH; CAD; chronic diastolic CHF; B cell lymphoma; HTN; HLD; OSA on CPAP; plaque psoriasis; and hypothyroidism presenting with recurrent falls.  He reports that it started 10 years ago when he had a fall with significant injuries.  He has had ongoing issues since with recurrent falls.  He has been falling more recently.  He had 3 falls overnight and his husband called 911.  He was recently told that he should stand still for a minute after standing, before starting to walk; he is not sure if he is feeling dizzy when standing though.  Hospital Course / Discharge diagnoses: Principal Problem:   Recurrent falls Active Problems:   Hypothyroidism   Diffuse large B-cell lymphoma (HCC)   Chronic diastolic heart failure (HCC)   BPH with obstruction/lower urinary tract symptoms   Paroxysmal atrial fibrillation (HCC)   Stage 3a chronic kidney disease (CKD) (HCC) - Baseline Scr 1.5 - 1.6   DNR (do not resuscitate)/DNI(Do Not Intubate)   CAD (coronary artery disease)   Dyslipidemia   OSA (obstructive sleep apnea)   Orthostatic hypotension   Malnutrition of moderate degree   Orthostasis   Principal  problem Orthostatic hypotension, recurrent falls-this is likely multifactorial, perhaps slightly dehydrated.  He was given IV fluids, his orthostasis resolved and clinically he feels significantly improved.  Due to intermittent balance issues as well as concern for slurred speech, he had an MRI of the brain which was negative for CVA/metastatic disease.  With fluids, reduction of his blood pressure medications he has improved and will be discharged home in stable condition.  Active problems Hypothyroidism-patient's husband, [name], tells me that he used to be on 200 mcg Synthroid.  I see that TSH was suppressed, and he was dropped to 100 mcg.  TSH currently is elevated significantly at 33, which can also contribute to his weakness.  Increase Synthroid to 125, he will need to have it rechecked as an outpatient in 3 to 4 weeks.  If still abnormal, please make small increment dosage changes.  He takes it correctly, first thing in the morning and at least 2 hours before his other meds or breakfast B-cell lymphoma-follows with oncology as an outpatient.  Has recurrence and is being considered for palliative radiation  PAF-continue home medications  CAD, chronic diastolic CHF-appears compensated Hyperlipidemia-continue atorvastatin, Zetia CKD 3A-appears at baseline OSA-continue CPAP Psoriasis -On Skyrizi at home Chronic pain-continue home medications  Sepsis ruled out   Discharge Instructions   Allergies as of 07/23/2022       Reactions   Short Ragweed Pollen Ext Cough        Medication List  STOP taking these medications    diltiazem 120 MG 24 hr capsule Commonly known as: Cardizem CD       TAKE these medications    acetaminophen 325 MG tablet Commonly known as: TYLENOL Take 2 tablets (650 mg total) by mouth every 6 (six) hours as needed for mild pain (or Fever >/= 101).   albuterol 108 (90 Base) MCG/ACT inhaler Commonly known as: VENTOLIN HFA Inhale 2 puffs into the  lungs every 6 (six) hours as needed for wheezing or shortness of breath.   alfuzosin 10 MG 24 hr tablet Commonly known as: UROXATRAL Take 10 mg by mouth at bedtime.   allopurinol 100 MG tablet Commonly known as: ZYLOPRIM Take 1 tablet (100 mg total) by mouth 2 (two) times daily.   ascorbic acid 500 MG tablet Commonly known as: VITAMIN C Take 500 mg by mouth daily with lunch.   atorvastatin 80 MG tablet Commonly known as: LIPITOR Take 80 mg by mouth at bedtime.   Biofreeze 10 % Crea Generic drug: Menthol (Topical Analgesic) Apply 1 Application topically daily as needed (Knee pain).   buPROPion 300 MG 24 hr tablet Commonly known as: WELLBUTRIN XL TAKE 1 TABLET EVERY DAY WITH BREAKFAST What changed: See the new instructions.   CALCIUM CITRATE + D PO Take 1 tablet by mouth 2 (two) times daily. '1200mg'$  of Calcium and 1000 units vitamin D3   clonazePAM 2 MG tablet Commonly known as: KLONOPIN Take 0.5 tablets (1 mg total) by mouth 3 (three) times daily as needed for anxiety.   CRANBERRY PO Take 2 tablets by mouth every morning.   denosumab 60 MG/ML Sosy injection Commonly known as: PROLIA Inject 60 mg as directed every 6 (six) months.   DULoxetine 60 MG capsule Commonly known as: CYMBALTA TAKE 1 CAPSULE TWICE DAILY   EPINEPHrine 0.3 mg/0.3 mL Soaj injection Commonly known as: EPI-PEN Inject 0.3 mg into the muscle once as needed for anaphylaxis (severe allergic reaction).   ezetimibe 10 MG tablet Commonly known as: ZETIA Take 10 mg by mouth at bedtime.   feeding supplement Liqd Take 237 mLs by mouth 2 (two) times daily between meals. What changed: when to take this   ferrous sulfate 325 (65 FE) MG tablet Take 325 mg by mouth 2 (two) times daily with a meal.   HYDROcodone-acetaminophen 7.5-325 MG tablet Commonly known as: NORCO Take 1 tablet by mouth every 6 (six) hours as needed for severe pain.   levothyroxine 125 MCG tablet Commonly known as: SYNTHROID Take  1 tablet (125 mcg total) by mouth daily at 6 (six) AM. Start taking on: July 24, 2022 What changed:  medication strength how much to take when to take this   lidocaine-prilocaine cream Commonly known as: EMLA Apply 1 Application topically as needed. What changed: reasons to take this   loratadine 10 MG tablet Commonly known as: CLARITIN Take 10 mg by mouth daily.   Magnesium 500 MG Caps Take 1 capsule (500 mg total) by mouth daily. What changed: when to take this   metoprolol tartrate 25 MG tablet Commonly known as: LOPRESSOR Take 25 mg by mouth 2 (two) times daily.   multivitamin with minerals Tabs tablet Take 1 tablet by mouth every morning. Men's One-A-Day 50+   Myrbetriq 25 MG Tb24 tablet Generic drug: mirabegron ER TAKE 1 TABLET EVERY DAY What changed:  how much to take when to take this   nitroGLYCERIN 0.4 MG SL tablet Commonly known as: NITROSTAT Place 0.4 mg under  the tongue every 5 (five) minutes as needed for chest pain.   omeprazole 40 MG capsule Commonly known as: PRILOSEC TAKE 1 CAPSULE EVERY DAY BEFORE BREAKFAST What changed: See the new instructions.   Potassium Citrate 15 MEQ (1620 MG) Tbcr Take 1 tablet by mouth See admin instructions. Take one tablet (15 meq) by mouth twice daily - with lunch and supper What changed:  when to take this additional instructions   PROBIOTIC PO Take 1 capsule by mouth daily with breakfast.   rivaroxaban 20 MG Tabs tablet Commonly known as: XARELTO Take 1 tablet (20 mg total) by mouth daily with supper.   SKYRIZI (150 MG DOSE) [place] Inject 150 mg into the skin every 4 (four) months.   solifenacin 10 MG tablet Commonly known as: VESICARE Take 10 mg by mouth daily.   testosterone enanthate 200 MG/ML injection Commonly known as: DELATESTRYL INJECT 1ML ('200MG'$ ) INTO THE MUSCLE EVERY 14 DAYS. DISCARD VIAL AFTER 28 DAYS. What changed:  how much to take how to take this when to take this additional  instructions   tiZANidine 4 MG tablet Commonly known as: ZANAFLEX Take 4 mg by mouth daily as needed for muscle spasms.   Vitamin D 50 MCG (2000 UT) tablet Take 4,000 Units by mouth daily.        Follow-up Information     [facility] Follow up.   Specialty: Home Health Services Contact information: [place] [address] [phone]                 Consultations: none  Procedures/Studies:  MR BRAIN W WO CONTRAST  Result Date: 07/22/2022 CLINICAL DATA:  Brain metastases, assess treatment response. EXAM: MRI HEAD WITHOUT AND WITH CONTRAST TECHNIQUE: Multiplanar, multiecho pulse sequences of the brain and surrounding structures were obtained without and with intravenous contrast. CONTRAST:  64m GADAVIST GADOBUTROL 1 MMOL/ML IV SOLN COMPARISON:  03/07/2022 FINDINGS: Brain: No acute infarct, mass effect or extra-axial collection. No acute or chronic hemorrhage. There is multifocal hyperintense T2-weighted signal within the white matter. Generalized volume loss. The midline structures are normal. Vascular: Major flow voids are preserved. Skull and upper cervical spine: Normal calvarium and skull base. Visualized upper cervical spine and soft tissues are normal. Sinuses/Orbits/pharynx: The nasopharyngeal mass has decreased slightly in AP thickness, now 10 mm, previously 17 mm. Right lateral extension of the mass into the parapharyngeal space is unchanged. Right mastoid effusion. IMPRESSION: 1. Slight decrease in AP thickness of nasopharyngeal mass, now 10 mm, previously 17 mm. 2. No intracranial metastatic disease. Electronically Signed   By: KUlyses JarredM.D.   On: 07/22/2022 19:51   CT Renal Stone Study  Result Date: 07/21/2022 CLINICAL DATA:  Nephrolithiasis EXAM: CT ABDOMEN AND PELVIS WITHOUT CONTRAST TECHNIQUE: Multidetector CT imaging of the abdomen and pelvis was performed following the standard protocol without IV contrast.  RADIATION DOSE REDUCTION: This exam was performed according to the departmental dose-optimization program which includes automated exposure control, adjustment of the mA and/or kV according to patient size and/or use of iterative reconstruction technique. COMPARISON:  PET-CT dated 05/19/2022. CT abdomen/pelvis dated 03/06/2022. FINDINGS: Lower chest: Mild right basilar opacity, likely atelectasis. Hepatobiliary: Unenhanced liver is unremarkable. Gallbladder is unremarkable. No intrahepatic or extrahepatic ductal dilatation. Pancreas: Within normal limits. Spleen: Within normal limits. Adrenals/Urinary Tract: 2.0 cm low-density right adrenal nodule, measuring less than 10 HUs, compatible with a benign adrenal adenoma. No follow-up is recommended. Left adrenal gland is within normal limits. 3  mm nonobstructing interpolar left renal calculus (series 3/image 41). Right kidney is within normal limits. No ureteral or bladder calculi. No hydronephrosis. Bladder is within normal limits. Stomach/Bowel: Stomach is within normal limits. No evidence of bowel obstruction. Appendix is not discretely visualized. No colonic wall thickening or inflammatory changes. Vascular/Lymphatic: No evidence of abdominal aortic aneurysm. Atherosclerotic calcifications of the abdominal aorta and branch vessels. No suspicious abdominopelvic lymphadenopathy. Reproductive: Prostate is largely obscured by streak artifact. Other: No abdominopelvic ascites. Small fat containing periumbilical hernia (series 3/image 52). Musculoskeletal: Right hip arthroplasty, status post revision with cerclage wire fixation. Severe compression fracture at L1, with moderate retropulsion and kyphotic deformity, unchanged. Mild degenerative changes. IMPRESSION: 3 mm nonobstructing interpolar left renal calculus. No ureteral or bladder calculi. No hydronephrosis. Additional stable ancillary findings as above. Electronically Signed   By: Julian Hy M.D.   On:  07/21/2022 01:23   CT Head Wo Contrast  Result Date: 07/20/2022 CLINICAL DATA:  Head trauma. Level 2 fall on blood thinners. Several falls today. EXAM: CT HEAD WITHOUT CONTRAST TECHNIQUE: Contiguous axial images were obtained from the base of the skull through the vertex without intravenous contrast. RADIATION DOSE REDUCTION: This exam was performed according to the departmental dose-optimization program which includes automated exposure control, adjustment of the mA and/or kV according to patient size and/or use of iterative reconstruction technique. COMPARISON:  CT head dated June 17, 2022 FINDINGS: Brain: No evidence of acute infarction, hemorrhage, hydrocephalus, extra-axial collection or mass lesion/mass effect. Prominence of the ventricles and sulci secondary to moderate cerebral atrophy. Patchy area of low-attenuation of the periventricular and subcortical white matter presumed chronic microvascular ischemic changes. Vascular: No hyperdense vessel or unexpected calcification. Skull: Normal. Negative for fracture or focal lesion. Sinuses/Orbits: No acute finding. Other: None. IMPRESSION: 1. No acute intracranial process. 2. Cerebral atrophy and chronic microvascular ischemic changes of the white matter, unchanged. Electronically Signed   By: Keane Police D.O.   On: 07/20/2022 23:48   DG Chest Port 1 View  Result Date: 07/20/2022 CLINICAL DATA:  Fall, chronic anticoagulation EXAM: PORTABLE CHEST 1 VIEW COMPARISON:  06/17/2022 FINDINGS: Lungs are clear. No pneumothorax or pleural effusion. Cardiac size is mildly enlarged, unchanged. Right internal jugular chest port tip noted within the superior vena cava. Pulmonary vascularity is normal. No acute bone abnormality. IMPRESSION: Stable cardiomegaly. Electronically Signed   By: Fidela Salisbury M.D.   On: 07/20/2022 23:37     Subjective: - no chest pain, shortness of breath, no abdominal pain, nausea or vomiting.   Discharge Exam: BP 121/68 (BP  Location: Left Arm)   Pulse 90   Temp 98.3 F (36.8 C) (Oral)   Resp 17   Ht '6\' 4"'$  (1.93 m)   Wt 114.3 kg   SpO2 96%   BMI 30.67 kg/m   General: Pt is alert, awake, not in acute distress Cardiovascular: RRR, S1/S2 +, no rubs, no gallops Respiratory: CTA bilaterally, no wheezing, no rhonchi Abdominal: Soft, NT, ND, bowel sounds + Extremities: no edema, no cyanosis    The results of significant diagnostics from this hospitalization (including imaging, microbiology, ancillary and laboratory) are listed below for reference.     Microbiology: Recent Results (from the past 240 hour(s))  Urine Culture     Status: Abnormal   Collection Time: 07/20/22 11:42 PM   Specimen: Urine, Clean Catch  Result Value Ref Range Status   Specimen Description URINE, CLEAN CATCH  Final   Special Requests   Final    NONE  Performed at [facility], [place] [address] [place], [place] [address]    Culture MULTIPLE SPECIES PRESENT, SUGGEST RECOLLECTION (A)  Final   Report Status 07/22/2022 FINAL  Final     Labs: Basic Metabolic Panel: Recent Labs  Lab 07/20/22 2347 07/21/22 0017 07/22/22 0217 07/23/22 0418  NA 137 134* 136 137  K 3.9 4.1 3.5 3.5  CL 99 99 101 102  CO2  --  '26 26 27  '$ GLUCOSE 113* 114* 107* 86  BUN 28* 29* 24* 21  CREATININE 1.40* 1.42* 1.37* 1.21  CALCIUM  --  9.8 8.8* 8.7*  MG  --   --   --  1.3*  PHOS  --   --   --  2.9   Liver Function Tests: Recent Labs  Lab 07/21/22 0017 07/23/22 0418  AST 23 20  ALT 15 16  ALKPHOS 83 74  BILITOT 0.8 0.9  PROT 5.2* 5.3*  ALBUMIN 2.5* 2.6*   CBC: Recent Labs  Lab 07/20/22 2347 07/21/22 0017 07/22/22 0217 07/23/22 0418  WBC  --  6.5 5.3 5.1  NEUTROABS  --  4.5  --   --   HGB 13.3 12.2* 12.1* 12.5*  HCT 39.0 36.8* 34.6* 36.6*  MCV  --  98.1 93.5 93.6  PLT  --  197 181 182   CBG: Recent Labs  Lab 07/21/22 0015  GLUCAP 107*   Hgb A1c No results for input(s): "HGBA1C" in the last 72 hours. Lipid  Profile No results for input(s): "CHOL", "HDL", "LDLCALC", "TRIG", "CHOLHDL", "LDLDIRECT" in the last 72 hours. Thyroid function studies Recent Labs    07/22/22 0217  TSH 33.136*   Urinalysis    Component Value Date/Time   COLORURINE AMBER (A) 07/20/2022 0118   APPEARANCEUR TURBID (A) 07/20/2022 0118   LABSPEC 1.018 07/20/2022 0118   PHURINE 7.0 07/20/2022 0118   GLUCOSEU NEGATIVE 07/20/2022 0118   GLUCOSEU NEGATIVE 10/02/2018 1235   HGBUR MODERATE (A) 07/20/2022 0118   BILIRUBINUR NEGATIVE 07/20/2022 0118   KETONESUR NEGATIVE 07/20/2022 0118   PROTEINUR 100 (A) 07/20/2022 0118   UROBILINOGEN 0.2 10/02/2018 1235   NITRITE NEGATIVE 07/20/2022 0118   LEUKOCYTESUR SMALL (A) 07/20/2022 0118    FURTHER DISCHARGE INSTRUCTIONS:   Get Medicines reviewed and adjusted: Please take all your medications with you for your next visit with your Primary MD   Laboratory/radiological data: Please request your Primary MD to go over all hospital tests and procedure/radiological results at the follow up, please ask your Primary MD to get all Hospital records sent to his/her office.   In some cases, they will be blood work, cultures and biopsy results pending at the time of your discharge. Please request that your primary care M.D. goes through all the records of your hospital data and follows up on these results.   Also Note the following: If you experience worsening of your admission symptoms, develop shortness of breath, life threatening emergency, suicidal or homicidal thoughts you must seek medical attention immediately by calling 911 or calling your MD immediately  if symptoms less severe.   You must read complete instructions/literature along with all the possible adverse reactions/side effects for all the Medicines you take and that have been prescribed to you. Take any new Medicines after you have completely understood and accpet all the possible adverse reactions/side effects.    Do not  drive when taking Pain medications or sleeping medications (Benzodaizepines)   Do not take more than prescribed Pain, Sleep and  Anxiety Medications. It is not advisable to combine anxiety,sleep and pain medications without talking with your primary care practitioner   Special Instructions: If you have smoked or chewed Tobacco  in the last 2 yrs please stop smoking, stop any regular Alcohol  and or any Recreational drug use.   Wear Seat belts while driving.   Please note: You were cared for by a hospitalist during your hospital stay. Once you are discharged, your primary care physician will handle any further medical issues. Please note that NO REFILLS for any discharge medications will be authorized once you are discharged, as it is imperative that you return to your primary care physician (or establish a relationship with a primary care physician if you do not have one) for your post hospital discharge needs so that they can reassess your need for medications and monitor your lab values.  Time coordinating discharge: 40 minutes  SIGNED:  Marzetta Board, MD, PhD 07/23/2022, 3:36 PM

## 2022-07-23 NOTE — Evaluation (Signed)
Speech Language Pathology Evaluation Patient Details Name: [name] MRN: [id number] DOB: [birth year] Today's Date: 07/23/2022 Time: 0258-5277 SLP Time Calculation (min) (ACUTE ONLY): 16 min  Problem List:  Patient Active Problem List   Diagnosis Date Noted   Orthostasis 07/23/2022   Malnutrition of moderate degree 07/22/2022   Recurrent UTI 06/02/2022   Diffuse large B cell lymphoma ([place]) 05/24/2022   Recurrent falls 05/20/2022   Orthostatic hypotension 04/27/2022   Sepsis secondary to UTI ([place]) 04/22/2022   Encounter for antineoplastic chemotherapy    AKI (acute kidney injury) ([place]) 03/19/2022   DNR (do not resuscitate)/DNI(Do Not Intubate) 03/19/2022   Lung mass 03/06/2022   Stage 3a chronic kidney disease (CKD) ([place]) - Baseline Scr 1.5 - 1.6 03/06/2022   Allergic rhinitis 01/25/2022   Allergic rhinitis due to animal (cat) (dog) hair and dander 01/25/2022   Allergic rhinitis due to pollen 01/25/2022   Mild intermittent asthma 01/25/2022   Vasomotor rhinitis 01/25/2022   Multiple skin nodules 01/25/2022   Weight loss 01/25/2022   Pure hypercholesterolemia 02/10/2021   Chronic diastolic heart failure ([place]) 06/21/2019   H/O nephrolithotomy with removal of calculi 03/26/2019   Renal calculus 03/26/2019   History of chemotherapy 11/21/2018   Lymphoma in remission ([place]) 11/21/2018   Generalized weakness 11/08/2018   Well adult exam 10/02/2018   Counseling regarding advance care planning and goals of care 07/09/2018   Diffuse large B-cell lymphoma ([place]) 06/23/2018   Port-A-Cath in place 06/23/2018   Hypogonadism in male 11/22/2017   Nephrolithiasis 10/24/2017   Recurrent dislocation of left patella 07/15/2017   Recurrent subluxation of patella, left 07/08/2017   At high risk for falls 05/17/2017   S/P revision of total knee 04/11/2017   Failed total knee, left, initial encounter ([facility]) 04/09/2017   Renal calculus, right 02/08/2017   Protein-calorie malnutrition,  severe ([place]) 12/08/2016   Chronic obstructive pulmonary disease ([place]) 12/08/2016   Staghorn renal calculus 11/05/2016   Senile osteoporosis 06/08/2016   Dyslipidemia 01/12/2016   Insomnia secondary to depression with anxiety 12/02/2015   Generalized anxiety disorder 11/19/2015   Wound drainage right hip 11/15/2013   Expected blood loss anemia 09/18/2013   S/P right TH revision 09/17/2013   BPH with obstruction/lower urinary tract symptoms 08/03/2013   History of gastroesophageal reflux (GERD) 08/03/2013   OSA (obstructive sleep apnea) 08/03/2013   Paroxysmal atrial fibrillation ([place]) 08/03/2013   Presence of unspecified artificial hip joint 08/03/2013   Nonunion, fracture, Right tibia  07/03/2013   UTI (urinary tract infection) 07/03/2013   Chronic anticoagulation 02/21/2013   Fall 02/08/2013   Physical deconditioning 02/08/2013   Low back pain radiating to both legs 02/08/2013   Compression fracture of L1 lumbar vertebra ([place]) 02/08/2013   Morbid obesity ([place]) 02/05/2013   Anemia 10/06/2012   Essential hypertension 10/03/2012   Psoriasis 10/03/2012   Vitamin D deficiency 10/03/2012   GERD (gastroesophageal reflux disease) 10/03/2012   Anxiety 10/03/2012   Depression 10/03/2012   CAD (coronary artery disease) 10/03/2012   DJD (degenerative joint disease) 10/03/2012   Chronic gingivitis 10/03/2012   Low testosterone, possible hypogonadism 10/01/2012   Normocytic anemia 09/30/2012   Longstanding persistent atrial fibrillation ([place]) 09/29/2012   Hypothyroidism 09/29/2012   History of recurrent UTIs 09/29/2012   Past Medical History:  Past Medical History:  Diagnosis Date   Benign localized prostatic hyperplasia with lower urinary tract symptoms (LUTS)    CAD (coronary artery disease) cardiologist--  dr t. Oval Linsey   hx of known CAD obstruction  w/ collaterals (cath done @ [facility] 12-31-2011) ;   last cardiac cath 08-13-2019  showed sig. 2V CAD involving proxLAD and CTO of the  RCA/  chronic total occlusion midRCA w/ bridging and L>R collaterals   Diastolic CHF ([place])    dx [id number] hospital admission (followed by pcp)   Diffuse large B cell lymphoma [facility]) oncologist-- dr Irene Limbo--- in remission   dx 08/ 2019 -- bx left tonsill mass-- involving lymph nodes--- completed chemotherapy 10-12-2018   DJD (degenerative joint disease)    Dysthymic disorder    Environmental allergies    GAD (generalized anxiety disorder)    with dysthymia   GERD (gastroesophageal reflux disease)    History of falling    multiple times   History of kidney stones    HLD (hyperlipidemia)    Hyperlipidemia    Hypertension    Hypogonadism in male    OA (osteoarthritis)    OSA on CPAP    cpap machine-settings 17   Osteoporosis with fracture 04/24/2013   Persistent atrial fibrillation (HCC)    On Xarelto, cardiologist--- dr Alfonso Patten. [place]   Plaque psoriasis    dermatologist--- dr Harriett Sine--- currently taking otezla   Post-surgical hypothyroidism    followed by pcp---  s/p total thyroidectomy for graves disease in 1987   Wears hearing aid in both ears    Past Surgical History:  Past Surgical History:  Procedure Laterality Date   APPENDECTOMY  2011 approx.    CARDIAC CATHETERIZATION  12-31-2011   dr j. Beatrix Fetters '@[facility]'$    normal LVF w/ multivessel CAD,  occluded RCA w/ colleterals   CATARACT EXTRACTION W/ INTRAOCULAR LENS  IMPLANT, BILATERAL  2016 approx.   COLONOSCOPY     CYSTOSCOPY WITH RETROGRADE PYELOGRAM, URETEROSCOPY AND STENT PLACEMENT Left 11/07/2017   Procedure: CYSTOSCOPY WITH RETROGRADE PYELOGRAM, URETEROSCOPY AND STENT PLACEMENT;  Surgeon: Cleon Gustin, MD;  Location: [facility];  Service: Urology;  Laterality: Left;   CYSTOSCOPY WITH RETROGRADE PYELOGRAM, URETEROSCOPY AND STENT PLACEMENT Right 10/22/2019   Procedure: CYSTOSCOPY WITH RETROGRADE PYELOGRAM, URETEROSCOPY AND STENT PLACEMENT;  Surgeon: Cleon Gustin, MD;  Location[facility];  Service:  Urology;  Laterality: Right;   CYSTOSCOPY/URETEROSCOPY/HOLMIUM LASER/STENT PLACEMENT Right 03/11/2017   Procedure: CYSTOSCOPY/URETEROSCOPYSTENT PLACEMENT right ureter retrograde pylegram;  Surgeon: Cleon Gustin, MD;  [facility];  Service: Urology;  Laterality: Right;   HARDWARE REMOVAL  10/05/2012   Procedure: HARDWARE REMOVAL;  Surgeon: [name], MD;  [facility];  Service: Orthopedics;  Laterality: Right;  REMOVING  STRYKER  GAMMA NAIL   HARDWARE REMOVAL Right 07/03/2013   Procedure: HARDWARE REMOVAL RIGHT TIBIA ;  Surgeon: Rozanna Box, MD;  Location: [place];  Service: Orthopedics;  Laterality: Right;   HIP CLOSED REDUCTION Right 10/15/2013   Procedure: CLOSED MANIPULATION HIP;  Surgeon: [name], MD;  [facility];  Service: Orthopedics;  Laterality: Right;   HOLMIUM LASER APPLICATION Right 04/10/625   Procedure: HOLMIUM LASER APPLICATION;  Surgeon: Cleon Gustin, MD;  [facility];  Service: Urology;  Laterality: Right;   HOLMIUM LASER APPLICATION Left 9/48/5462   Procedure: HOLMIUM LASER APPLICATION;  Surgeon: Cleon Gustin, MD;  [facility];  Service: Urology;  Laterality: Left;   HOLMIUM LASER APPLICATION Left 7/0/3500   Procedure: HOLMIUM LASER APPLICATION;  Surgeon: Cleon Gustin, MD;  [facility];  Service: Urology;  Laterality: Left;   HOLMIUM LASER APPLICATION Right 9/38/1829   Procedure: HOLMIUM LASER APPLICATION;  Surgeon: Cleon Gustin, MD;  [facility];  Service: Urology;  Laterality: Right;   INCISION AND DRAINAGE HIP Right 11/16/2013   Procedure: IRRIGATION AND DEBRIDEMENT RIGHT HIP;  Surgeon: [name], MD;  [facility];  Service: Orthopedics;  Laterality: Right;   INTRAVASCULAR PRESSURE WIRE/FFR STUDY N/A 08/13/2019   Procedure: INTRAVASCULAR PRESSURE WIRE/FFR STUDY;  Surgeon: Nelva Bush, MD;  Location: [place] [facility];  Service: Cardiovascular;  Laterality: N/A;   IR  IMAGING GUIDED PORT INSERTION  06/22/2018   IR NEPHROSTOMY PLACEMENT LEFT  03/28/2019   IR URETERAL STENT LEFT NEW ACCESS W/O SEP NEPHROSTOMY CATH  10/24/2017   IR URETERAL STENT LEFT NEW ACCESS W/O SEP NEPHROSTOMY CATH  03/26/2019   NEPHROLITHOTOMY Right 02/08/2017   Procedure: NEPHROLITHOTOMY PERCUTANEOUS WITH SURGEON ACCESS;  Surgeon: Cleon Gustin, MD;  [facility];  Service: Urology;  Laterality: Right;   NEPHROLITHOTOMY Left 10/24/2017   Procedure: NEPHROLITHOTOMY PERCUTANEOUS;  Surgeon: Cleon Gustin, MD;  [facility];  Service: Urology;  Laterality: Left;   NEPHROLITHOTOMY Left 03/26/2019   Procedure: NEPHROLITHOTOMY PERCUTANEOUS;  Surgeon: Cleon Gustin, MD;  [facility];  Service: Urology;  Laterality: Left;  2 HRS   NEPHROLITHOTOMY Left 04/17/2019   Procedure: NEPHROLITHOTOMY PERCUTANEOUS;  Surgeon: Cleon Gustin, MD;  [facility];  Service: Urology;  Laterality: Left;  2 HRS   ORIF TIBIA FRACTURE Right 02/06/2013   Procedure: OPEN REDUCTION INTERNAL FIXATION (ORIF) TIBIA FRACTURE WITH IM ROD FIBULA;  Surgeon: Rozanna Box, MD;  Location: [place];  Service: Orthopedics;  Laterality: Right;   ORIF TIBIA FRACTURE Right 07/03/2013   Procedure: RIGHT TIBIA NON UNION REPAIR ;  Surgeon: Rozanna Box, MD;  Location: [place];  Service: Orthopedics;  Laterality: Right;   ORIF WRIST FRACTURE  10/02/2012   Procedure: OPEN REDUCTION INTERNAL FIXATION (ORIF) WRIST FRACTURE;  Surgeon: Roseanne Kaufman, MD;  [facility];  Service: Orthopedics;  Laterality: Right;  WITH   ANTIBIOTIC  CEMENT   ORIF WRIST FRACTURE Left 10/28/2013   Procedure: OPEN REDUCTION INTERNAL FIXATION (ORIF) WRIST FRACTURE with allograft;  Surgeon: Roseanne Kaufman, MD;  [facility];  Service: Orthopedics;  Laterality: Left;  DVR Plate   QUADRICEPS TENDON REPAIR Left 07/15/2017   Procedure: REPAIR QUADRICEP TENDON;  Surgeon: Frederik Pear, MD;  Location: [place];  Service: Orthopedics;   Laterality: Left;   RIGHT/LEFT HEART CATH AND CORONARY ANGIOGRAPHY N/A 08/13/2019   Procedure: RIGHT/LEFT HEART CATH AND CORONARY ANGIOGRAPHY;  Surgeon: Nelva Bush, MD;  Location: [place] [facility];  Service: Cardiovascular;  Laterality: N/A;   THYROIDECTOMY  02/1986   TOTAL HIP ARTHROPLASTY Right 03-16-2016   '@[facility]'$    TOTAL HIP REVISION  10/05/2012   Procedure: TOTAL HIP REVISION;  Surgeon: [name], MD;  [facility];  Service: Orthopedics;  Laterality: Right;  RIGHT TOTAL HIP REVISION   TOTAL HIP REVISION Right 09/17/2013   Procedure: REVISION RIGHT TOTAL HIP ARTHROPLASTY ;  Surgeon: [name], MD;  [facility];  Service: Orthopedics;  Laterality: Right;   TOTAL HIP REVISION Right 10/26/2013   Procedure: REVISION RIGHT TOTAL HIP ARTHROPLASTY;  Surgeon: [name], MD;  [facility];  Service: Orthopedics;  Laterality: Right;   TOTAL KNEE ARTHROPLASTY Bilateral right 03-15-2011;  left 06-30-2011   TOTAL KNEE REVISION Left 04/11/2017   Procedure: TOTAL KNEE REVISION PATELLA and TIBIA;  Surgeon: Frederik Pear, MD;  Location: [place];  Service:  Orthopedics;  Laterality: Left;   HPI:  Pt is a 76 y/o male who presented with recurrent falls at home in setting of orthostatic hypotension. Pt recently admitted 9/1-9/7 for a fall at home. MRI (10/12) revealed "No intracranial metastatic disease". Per MD note (10/11), there is concern for "some level of cognitive impairment". SLE ordered. PMH: a fib, BPH; CAD; chronic diastolic CHF; B cell lymphoma; HTN; HLD; OSA on CPAP; plaque psoriasis; and hypothyroidism.   Assessment / Plan / Recommendation Clinical Impression  Pt presents with cognitive communication deficits, although he reports he is at baseline and no family present to confirm. He does report some intermittent difficulty with STM at [facility] and he uses external memory aids to compensate. [employer] Mental Status Examination (SLUMS) administered to assess  cognitive functioning, with pt yielding the following score: 26/30, which it outside the normal range of 27-30 and associated with mild cognitive impairment. His only deficits were with delayed memory of listed items and paragraph level material. Otherwise, orientation, executive functions, fluency and problem solving were [facility]. Motor speech and receptive/expressive language also [facility] for tasks provided. Given pt is likely near or at baseline, no further SLP services warranted in the acute setting.    SLP Assessment  SLP Recommendation/Assessment: Patient does not need any further Speech [place] [facility] SLP Visit Diagnosis: Cognitive communication deficit (R41.841)    Recommendations for follow up therapy are one component of a multi-disciplinary discharge planning process, led by the attending physician.  Recommendations may be updated based on patient status, additional functional criteria and insurance authorization.    Follow Up Recommendations  No SLP follow up    Assistance Recommended at Discharge  Set up Supervision/Assistance  Functional Status Assessment Patient has not had a recent decline in their functional status  Frequency and Duration           SLP Evaluation Cognition  Overall Cognitive Status: History of cognitive impairments - at baseline Arousal/Alertness: Awake/alert Orientation Level: Oriented X4 Year: 2023 Month: October Day of Week: Correct Attention: Sustained Sustained Attention: Appears intact Memory: Impaired Memory Impairment: Retrieval deficit;Decreased recall of new information Awareness: Appears intact Problem Solving: Appears intact Executive Function: Reasoning;Self Monitoring;Self Correcting Reasoning: Appears intact Self Monitoring: Appears intact Self Correcting: Appears intact       Comprehension  Auditory Comprehension Overall Auditory Comprehension: Appears within functional limits for tasks assessed Visual  Recognition/Discrimination Discrimination: Not tested Reading Comprehension Reading Status: Not tested    Expression Expression Primary Mode of Expression: Verbal Verbal Expression Overall Verbal Expression: Appears within functional limits for tasks assessed Written Expression Dominant Hand: Left Written Expression: Not tested   Oral / Motor  Oral Motor/Sensory Function Overall Oral Motor/Sensory Function: Within functional limits Motor Speech Overall Motor Speech: Appears within functional limits for tasks assessed             [place], [place], [place] Office Number: [phone]  Acie Fredrickson 07/23/2022, 10:21 AM

## 2022-07-23 NOTE — Progress Notes (Signed)
OT Cancellation Note  Patient Details Name: [name] MRN: [id number] DOB: 1945/11/28   Cancelled Treatment:    Reason Eval/Treat Not Completed: Patient declined, no reason specified Pt reported plan to DC home this AM and declined to participate in OT session.   Layla Maw 07/23/2022, 8:42 AM

## 2022-07-26 DIAGNOSIS — C799 Secondary malignant neoplasm of unspecified site: Secondary | ICD-10-CM | POA: Diagnosis not present

## 2022-07-26 DIAGNOSIS — C8330 Diffuse large B-cell lymphoma, unspecified site: Secondary | ICD-10-CM | POA: Diagnosis not present

## 2022-07-26 DIAGNOSIS — C8339 Diffuse large B-cell lymphoma, extranodal and solid organ sites: Secondary | ICD-10-CM | POA: Diagnosis not present

## 2022-07-27 DIAGNOSIS — J301 Allergic rhinitis due to pollen: Secondary | ICD-10-CM | POA: Diagnosis not present

## 2022-07-27 DIAGNOSIS — J3081 Allergic rhinitis due to animal (cat) (dog) hair and dander: Secondary | ICD-10-CM | POA: Diagnosis not present

## 2022-07-27 DIAGNOSIS — J3089 Other allergic rhinitis: Secondary | ICD-10-CM | POA: Diagnosis not present

## 2022-07-29 DIAGNOSIS — C8336 Diffuse large B-cell lymphoma, intrapelvic lymph nodes: Secondary | ICD-10-CM | POA: Diagnosis not present

## 2022-07-29 DIAGNOSIS — C8330 Diffuse large B-cell lymphoma, unspecified site: Secondary | ICD-10-CM | POA: Diagnosis not present

## 2022-07-29 DIAGNOSIS — Z9221 Personal history of antineoplastic chemotherapy: Secondary | ICD-10-CM | POA: Diagnosis not present

## 2022-07-29 DIAGNOSIS — Z510 Encounter for antineoplastic radiation therapy: Secondary | ICD-10-CM | POA: Diagnosis not present

## 2022-07-30 ENCOUNTER — Encounter: Payer: Self-pay | Admitting: Infectious Disease

## 2022-08-02 ENCOUNTER — Telehealth: Payer: Self-pay | Admitting: Internal Medicine

## 2022-08-02 DIAGNOSIS — L89309 Pressure ulcer of unspecified buttock, unspecified stage: Secondary | ICD-10-CM | POA: Diagnosis not present

## 2022-08-02 DIAGNOSIS — R296 Repeated falls: Secondary | ICD-10-CM | POA: Diagnosis not present

## 2022-08-02 DIAGNOSIS — C8330 Diffuse large B-cell lymphoma, unspecified site: Secondary | ICD-10-CM | POA: Diagnosis not present

## 2022-08-02 DIAGNOSIS — Z923 Personal history of irradiation: Secondary | ICD-10-CM | POA: Diagnosis not present

## 2022-08-02 DIAGNOSIS — R5381 Other malaise: Secondary | ICD-10-CM | POA: Diagnosis not present

## 2022-08-02 DIAGNOSIS — L98419 Non-pressure chronic ulcer of buttock with unspecified severity: Secondary | ICD-10-CM | POA: Diagnosis not present

## 2022-08-02 NOTE — Telephone Encounter (Signed)
Noted! Thank you

## 2022-08-02 NOTE — Telephone Encounter (Signed)
FYI - Patient had a fall on the 10th and the 13th. - No injuries

## 2022-08-03 ENCOUNTER — Ambulatory Visit: Payer: Medicare PPO | Admitting: Infectious Disease

## 2022-08-03 ENCOUNTER — Telehealth: Payer: Self-pay | Admitting: *Deleted

## 2022-08-03 ENCOUNTER — Encounter: Payer: Self-pay | Admitting: *Deleted

## 2022-08-03 DIAGNOSIS — J3089 Other allergic rhinitis: Secondary | ICD-10-CM | POA: Diagnosis not present

## 2022-08-03 DIAGNOSIS — J301 Allergic rhinitis due to pollen: Secondary | ICD-10-CM | POA: Diagnosis not present

## 2022-08-03 NOTE — Patient Outreach (Signed)
  Care Coordination [facility] Note Transition Care Management Unsuccessful Follow-up Telephone Call  Date of discharge and from where:  Friday, October 13. 2023- [place]; orthostatic hypotension/ fall at home  Attempts:  1st Attempt  Reason for unsuccessful TCM follow-up call:  Left voice message  Oneta Rack, RN, BSN, CCRN Alumnus RN CM Care Coordination/ Transition of [place] Management [phone]: direct office

## 2022-08-04 ENCOUNTER — Telehealth: Payer: Self-pay | Admitting: *Deleted

## 2022-08-04 ENCOUNTER — Telehealth: Payer: Self-pay | Admitting: Internal Medicine

## 2022-08-04 ENCOUNTER — Encounter: Payer: Self-pay | Admitting: *Deleted

## 2022-08-04 NOTE — Patient Outreach (Signed)
  Care Coordination [facility] Note Transition Care Management Unsuccessful Follow-up Telephone Call  Date of discharge and from where:  Friday, 07/23/22 [place]; orthostatic hypotension; fall  Attempts:  2nd Attempt  Reason for unsuccessful TCM follow-up call:  Left voice message  Oneta Rack, RN, BSN, CCRN Alumnus RN CM Care Coordination/ Transition of [place] Management [phone]: direct office

## 2022-08-04 NOTE — Telephone Encounter (Signed)
Patient has had 2 falls - one on Sunday and One on Monday - Patient had a skin tear on left forearm.   They need verbal orders for wound care - Xerosorm and tegaderm change weekly

## 2022-08-05 ENCOUNTER — Other Ambulatory Visit: Payer: Self-pay

## 2022-08-05 ENCOUNTER — Telehealth: Payer: Self-pay | Admitting: *Deleted

## 2022-08-05 ENCOUNTER — Ambulatory Visit (INDEPENDENT_AMBULATORY_CARE_PROVIDER_SITE_OTHER): Payer: Medicare PPO | Admitting: Infectious Disease

## 2022-08-05 ENCOUNTER — Encounter: Payer: Self-pay | Admitting: Infectious Disease

## 2022-08-05 ENCOUNTER — Encounter: Payer: Self-pay | Admitting: *Deleted

## 2022-08-05 VITALS — BP 90/59 | HR 61 | Temp 97.2°F

## 2022-08-05 DIAGNOSIS — R296 Repeated falls: Secondary | ICD-10-CM | POA: Diagnosis not present

## 2022-08-05 DIAGNOSIS — C8330 Diffuse large B-cell lymphoma, unspecified site: Secondary | ICD-10-CM | POA: Diagnosis not present

## 2022-08-05 DIAGNOSIS — M2212 Recurrent subluxation of patella, left knee: Secondary | ICD-10-CM | POA: Diagnosis not present

## 2022-08-05 DIAGNOSIS — N390 Urinary tract infection, site not specified: Secondary | ICD-10-CM | POA: Diagnosis not present

## 2022-08-05 DIAGNOSIS — J449 Chronic obstructive pulmonary disease, unspecified: Secondary | ICD-10-CM | POA: Diagnosis not present

## 2022-08-05 DIAGNOSIS — G9341 Metabolic encephalopathy: Secondary | ICD-10-CM | POA: Diagnosis not present

## 2022-08-05 NOTE — Progress Notes (Signed)
Subjective:  Chief complaint: Follow-up for extended spectrum beta-lactamase producing E. coli colonization plus or minus recurrent infection  Patient ID: [name], male    DOB: 1946/07/05, 76 y.o.   MRN: [id number]  HPI  [name] is a 76 year old Caucasian man with a history of coronary disease unfortunately recurrent diffuse large B-cell lymphoma, history of renal calculi including a staghorn calculus problems with voiding urinary incontinence who is recently in July admitted to the hospital with severe urinary tract infection due to ESBL.  He was given meropenem in the hospital and then sent home with Macrobid.  He was feeling better while taking the meropenem and Macrobid but then began to feel poorly again with malaise.  Note before he was diagnosed with a severe infection he did not have much in the way of urinary symptoms other than polyuria.  He did not have dysuria or suprapubic pain or clear-cut back pain.  However he had begun to become confused and encephalopathic and was calling the wrong numbers and saying things that did not make sense to his friends.  Ultimately his husband was able to convince him to come in to the ER where he was hospitalized he was seen by his oncologist Dr. Irene Limbo who  was concerned by patient's apparent recurrence of malaise and symptoms and he was initiated on meropenem for an 11-day course.  He was able to get this at home via his port and his husband was able to administer antibiotics for the port after teaching from home health.  He was continuing to struggle with multiple medical problems including orthostatic hypotension deconditioning and frequent falls the weekend prior to my seeing him in clinic.  He was given a prescription of Augmentin for presumed urinary tract infection by his primary care physician though is not clear that he has symptoms that are clearly due to urinary tract infection.  One of the falls he had sounds to have been due to  proprioceptive problems he did strike his knee which is has a wound which does not appear overtly infected.  His urine was analyzed at PCPs office but no culture done there is a urine culture done by the home health company which showed E coli    ** S = Susceptible; I = Intermediate; R = Resistant **                    P = Positive; N = Negative             MICS are expressed in micrograms per mL    Antibiotic                 RSLT#1    RSLT#2    RSLT#3    RSLT#4 Amoxicillin/Clavulanic Acid    S Ampicillin                     R Cefazolin                      R Cefepime                       S Ceftriaxone                    R Cefuroxime                     R Ciprofloxacin  R Ertapenem                      S Gentamicin                     R Imipenem                       S Levofloxacin                   R Meropenem                      S Nitrofurantoin                 S Piperacillin/Tazobactam        S Tetracycline                   R Tobramycin                     R Trimethoprim/Sulfa             R  I saw him I was not entirely convinced that he had a urinary tract infection due to the absence of specific urinary symptoms.  I was bothered by the fact that he had fallen 3 times in by some of his other systemic symptoms though it was not clear to me that they were due to an infection.  I decided to err on the side of being aggressive with antibiotics and initiated IV ertapenem I also asked for him to receive a bolus of normal saline.  In the chart he does have an extensive history of kidney stones including a staghorn calculus at one point in time.  In talking to  him at last visit todae he is not sure if he is better or not.  His husband  however tells me that since the biotics have started there has been no more falling and no more episodes of confusion and of [name] not making sense.  Several visits ago he  has had multiple falls and been in the ER on multiple  occasions and now is residing at a skilled nursing facility.     There were multiple potential explanations for his fall including sedation, atrial fibrillation orthostatic hypotension.  Per the ER notes he was on amoxicillin to treat "a urinary tract infection.  He has been seen by [facility] obtained cultures 2 days prior to my last admission which are shown below         At his last visit he told me me today that he can feel some burning coming on a little bit.  His husband also said that the color of the urine and the odor of changed.  Note these latter characters is not something we used to make a diagnosis of urine tract infection.   The diagnosis of infection was not clear-cut we decided to treat him with another 14-day course of ertapenem.   Since I last saw [name] he was again hospitalized in October for recurrent falls. The etiology is not entirely clear but was felt to be multifactorial.  He improved with supportive care in the hospital.  He did not have evidence of infection. C He is begun radiation and also going to initiate CAR T cell therapy it appears to be seeing Oncology at [facility] though having difficulty reading the narratives in the notes currently.  Past Medical History:  Diagnosis Date   Benign localized prostatic hyperplasia with lower urinary tract symptoms (LUTS)    CAD (coronary artery disease) cardiologist--  dr t. Oval Linsey   hx of known CAD obstruction w/ collaterals (cath done @ [facility] 12-31-2011) ;   last cardiac cath 08-13-2019  showed sig. 2V CAD involving proxLAD and CTO of the RCA/  chronic total occlusion midRCA w/ bridging and L>R collaterals   Diastolic CHF ([place])    dx [id number] hospital admission (followed by pcp)   Diffuse large B cell lymphoma [facility]) oncologist-- dr Irene Limbo--- in remission   dx 08/ 2019 -- bx left tonsill mass-- involving lymph nodes--- completed chemotherapy 10-12-2018   DJD (degenerative joint disease)     Dysthymic disorder    Environmental allergies    GAD (generalized anxiety disorder)    with dysthymia   GERD (gastroesophageal reflux disease)    History of falling    multiple times   History of kidney stones    HLD (hyperlipidemia)    Hyperlipidemia    Hypertension    Hypogonadism in male    OA (osteoarthritis)    OSA on CPAP    cpap machine-settings 17   Osteoporosis with fracture 04/24/2013   Persistent atrial fibrillation ([place])    On Xarelto, cardiologist--- dr Alfonso Patten. [place]   Plaque psoriasis    dermatologist--- dr Harriett Sine--- currently taking otezla   Post-surgical hypothyroidism    followed by pcp---  s/p total thyroidectomy for graves disease in 1987   Wears hearing aid in both ears     Past Surgical History:  Procedure Laterality Date   APPENDECTOMY  2011 approx.    CARDIAC CATHETERIZATION  12-31-2011   dr j. Beatrix Fetters '@[facility]'$    normal LVF w/ multivessel CAD,  occluded RCA w/ colleterals   CATARACT EXTRACTION W/ INTRAOCULAR LENS  IMPLANT, BILATERAL  2016 approx.   COLONOSCOPY     CYSTOSCOPY WITH RETROGRADE PYELOGRAM, URETEROSCOPY AND STENT PLACEMENT Left 11/07/2017   Procedure: CYSTOSCOPY WITH RETROGRADE PYELOGRAM, URETEROSCOPY AND STENT PLACEMENT;  Surgeon: Cleon Gustin, MD;  Location: [facility];  Service: Urology;  Laterality: Left;   CYSTOSCOPY WITH RETROGRADE PYELOGRAM, URETEROSCOPY AND STENT PLACEMENT Right 10/22/2019   Procedure: CYSTOSCOPY WITH RETROGRADE PYELOGRAM, URETEROSCOPY AND STENT PLACEMENT;  Surgeon: Cleon Gustin, MD;  [facility];  Service: Urology;  Laterality: Right;   CYSTOSCOPY/URETEROSCOPY/HOLMIUM LASER/STENT PLACEMENT Right 03/11/2017   Procedure: CYSTOSCOPY/URETEROSCOPYSTENT PLACEMENT right ureter retrograde pylegram;  Surgeon: Cleon Gustin, MD;  [facility];  Service: Urology;  Laterality: Right;   HARDWARE REMOVAL  10/05/2012   Procedure: HARDWARE REMOVAL;  Surgeon: [name], MD;   [facility];  Service: Orthopedics;  Laterality: Right;  REMOVING  STRYKER  GAMMA NAIL   HARDWARE REMOVAL Right 07/03/2013   Procedure: HARDWARE REMOVAL RIGHT TIBIA ;  Surgeon: Rozanna Box, MD;  Location: [place];  Service: Orthopedics;  Laterality: Right;   HIP CLOSED REDUCTION Right 10/15/2013   Procedure: CLOSED MANIPULATION HIP;  Surgeon: [name], MD;  [facility];  Service: Orthopedics;  Laterality: Right;   HOLMIUM LASER APPLICATION Right 01/15/8294   Procedure: HOLMIUM LASER APPLICATION;  Surgeon: Cleon Gustin, MD;  [facility];  Service: Urology;  Laterality: Right;   HOLMIUM LASER APPLICATION Left 03/31/3085   Procedure: HOLMIUM LASER APPLICATION;  Surgeon: Cleon Gustin, MD;  [facility];  Service: Urology;  Laterality: Left;   HOLMIUM LASER APPLICATION Left 02/15/8468  Procedure: HOLMIUM LASER APPLICATION;  Surgeon: Cleon Gustin, MD;  [facility];  Service: Urology;  Laterality: Left;   HOLMIUM LASER APPLICATION Right 03/08/4131   Procedure: HOLMIUM LASER APPLICATION;  Surgeon: Cleon Gustin, MD;  Location: [facility];  Service: Urology;  Laterality: Right;   INCISION AND DRAINAGE HIP Right 11/16/2013   Procedure: IRRIGATION AND DEBRIDEMENT RIGHT HIP;  Surgeon: [name], MD;  [facility];  Service: Orthopedics;  Laterality: Right;   INTRAVASCULAR PRESSURE WIRE/FFR STUDY N/A 08/13/2019   Procedure: INTRAVASCULAR PRESSURE WIRE/FFR STUDY;  Surgeon: Nelva Bush, MD;  Location: [place] [facility];  Service: Cardiovascular;  Laterality: N/A;   IR IMAGING GUIDED PORT INSERTION  06/22/2018   IR NEPHROSTOMY PLACEMENT LEFT  03/28/2019   IR URETERAL STENT LEFT NEW ACCESS W/O SEP NEPHROSTOMY CATH  10/24/2017   IR URETERAL STENT LEFT NEW ACCESS W/O SEP NEPHROSTOMY CATH  03/26/2019   NEPHROLITHOTOMY Right 02/08/2017   Procedure: NEPHROLITHOTOMY PERCUTANEOUS WITH SURGEON ACCESS;  Surgeon: Cleon Gustin, MD;  [facility];  Service: Urology;  Laterality: Right;   NEPHROLITHOTOMY Left 10/24/2017   Procedure: NEPHROLITHOTOMY PERCUTANEOUS;  Surgeon: Cleon Gustin, MD;  [facility];  Service: Urology;  Laterality: Left;   NEPHROLITHOTOMY Left 03/26/2019   Procedure: NEPHROLITHOTOMY PERCUTANEOUS;  Surgeon: Cleon Gustin, MD;  [facility];  Service: Urology;  Laterality: Left;  2 HRS   NEPHROLITHOTOMY Left 04/17/2019   Procedure: NEPHROLITHOTOMY PERCUTANEOUS;  Surgeon: Cleon Gustin, MD;  [facility];  Service: Urology;  Laterality: Left;  2 HRS   ORIF TIBIA FRACTURE Right 02/06/2013   Procedure: OPEN REDUCTION INTERNAL FIXATION (ORIF) TIBIA FRACTURE WITH IM ROD FIBULA;  Surgeon: Rozanna Box, MD;  Location: [place];  Service: Orthopedics;  Laterality: Right;   ORIF TIBIA FRACTURE Right 07/03/2013   Procedure: RIGHT TIBIA NON UNION REPAIR ;  Surgeon: Rozanna Box, MD;  Location: [place];  Service: Orthopedics;  Laterality: Right;   ORIF WRIST FRACTURE  10/02/2012   Procedure: OPEN REDUCTION INTERNAL FIXATION (ORIF) WRIST FRACTURE;  Surgeon: Roseanne Kaufman, MD;  [facility];  Service: Orthopedics;  Laterality: Right;  WITH   ANTIBIOTIC  CEMENT   ORIF WRIST FRACTURE Left 10/28/2013   Procedure: OPEN REDUCTION INTERNAL FIXATION (ORIF) WRIST FRACTURE with allograft;  Surgeon: Roseanne Kaufman, MD;  [facility];  Service: Orthopedics;  Laterality: Left;  DVR Plate   QUADRICEPS TENDON REPAIR Left 07/15/2017   Procedure: REPAIR QUADRICEP TENDON;  Surgeon: Frederik Pear, MD;  Location: [place];  Service: Orthopedics;  Laterality: Left;   RIGHT/LEFT HEART CATH AND CORONARY ANGIOGRAPHY N/A 08/13/2019   Procedure: RIGHT/LEFT HEART CATH AND CORONARY ANGIOGRAPHY;  Surgeon: Nelva Bush, MD;  Location: [place] [facility];  Service: Cardiovascular;  Laterality: N/A;   THYROIDECTOMY  02/1986   TOTAL HIP ARTHROPLASTY Right 03-16-2016   '@[facility]'$    TOTAL HIP REVISION  10/05/2012   Procedure: TOTAL  HIP REVISION;  Surgeon: [name], MD;  [facility];  Service: Orthopedics;  Laterality: Right;  RIGHT TOTAL HIP REVISION   TOTAL HIP REVISION Right 09/17/2013   Procedure: REVISION RIGHT TOTAL HIP ARTHROPLASTY ;  Surgeon: [name], MD;  [facility];  Service: Orthopedics;  Laterality: Right;   TOTAL HIP REVISION Right 10/26/2013   Procedure: REVISION RIGHT TOTAL HIP ARTHROPLASTY;  Surgeon: [name], MD;  [facility];  Service: Orthopedics;  Laterality: Right;   TOTAL KNEE  ARTHROPLASTY Bilateral right 03-15-2011;  left 06-30-2011   TOTAL KNEE REVISION Left 04/11/2017   Procedure: TOTAL KNEE REVISION PATELLA and TIBIA;  Surgeon: Frederik Pear, MD;  Location: [place];  Service: Orthopedics;  Laterality: Left;    Family History  Problem Relation Age of Onset   CAD Father 7   Asthma Father    Alcohol abuse Father    Arthritis Mother    Alcohol abuse Sister       Social History   Socioeconomic History   Marital status: Married    Spouse name: Not on file   Number of children: Not on file   Years of education: Not on file   Highest education level: Not on file  Occupational History   Not on file  Tobacco Use   Smoking status: Former    Packs/day: 1.00    Years: 50.00    Total pack years: 50.00    Types: Cigarettes    Quit date: 01/12/2012    Years since quitting: 10.5   Smokeless tobacco: Never  Vaping Use   Vaping Use: Never used  Substance and Sexual Activity   Alcohol use: Yes    Comment: rarely   Drug use: Never   Sexual activity: Yes  Other Topics Concern   Not on file  Social History Narrative   [place] 2.5 months, went home Feb 21st slipped and fell on back and developed.  Home PT/OT.  Just started outpatient physical therapy.  Friday night, misstepped.       Pt spouse verbalized pt had fallen 4 times within the last 24 hours     Social Determinants of Health   Financial Resource Strain: Low Risk  (01/07/2022)   Overall Financial Resource  Strain (CARDIA)    Difficulty of Paying Living Expenses: Not hard at all  Food Insecurity: No Food Insecurity (07/22/2022)   Hunger Vital Sign    Worried About Running Out of Food in the Last Year: Never true    Ran Out of Food in the Last Year: Never true  Transportation Needs: No Transportation Needs (07/22/2022)   PRAPARE - Hydrologist (Medical): No    Lack of Transportation (Non-Medical): No  Physical Activity: Sufficiently Active (01/07/2022)   Exercise Vital Sign    Days of Exercise per Week: 3 days    Minutes of Exercise per Session: 60 min  Stress: No Stress Concern Present (01/07/2022)   [place]    Feeling of Stress : Not at all  Social Connections: [place] (01/07/2022)   Social Connection and Isolation Panel [NHANES]    Frequency of Communication with Friends and Family: More than three times a week    Frequency of Social Gatherings with Friends and Family: More than three times a week    Attends Religious Services: 1 to 4 times per year    Active Member of [employer] or Organizations: No    Attends Music therapist: 1 to 4 times per year    Marital Status: Married    Allergies  Allergen Reactions   Short Ragweed Pollen Ext Cough     Current Outpatient Medications:    acetaminophen (TYLENOL) 325 MG tablet, Take 2 tablets (650 mg total) by mouth every 6 (six) hours as needed for mild pain (or Fever >/= 101)., Disp: 20 tablet, Rfl:    albuterol (VENTOLIN HFA) 108 (90 Base) MCG/ACT inhaler, Inhale 2 puffs into the lungs every  6 (six) hours as needed for wheezing or shortness of breath., Disp: , Rfl:    alfuzosin (UROXATRAL) 10 MG 24 hr tablet, Take 10 mg by mouth at bedtime., Disp: , Rfl:    allopurinol (ZYLOPRIM) 100 MG tablet, Take 1 tablet (100 mg total) by mouth 2 (two) times daily., Disp: 180 tablet, Rfl: 1   atorvastatin (LIPITOR) 80 MG tablet, Take 80  mg by mouth at bedtime., Disp: , Rfl:    buPROPion (WELLBUTRIN XL) 300 MG 24 hr tablet, TAKE 1 TABLET EVERY DAY WITH BREAKFAST (Patient taking differently: Take 300 mg by mouth daily.), Disp: 90 tablet, Rfl: 1   Calcium Citrate-Vitamin D (CALCIUM CITRATE + D PO), Take 1 tablet by mouth 2 (two) times daily. '1200mg'$  of Calcium and 1000 units vitamin D3, Disp: , Rfl:    Cholecalciferol (VITAMIN D) 50 MCG (2000 UT) tablet, Take 4,000 Units by mouth daily., Disp: , Rfl:    clonazePAM (KLONOPIN) 2 MG tablet, Take 0.5 tablets (1 mg total) by mouth 3 (three) times daily as needed for anxiety., Disp: 90 tablet, Rfl: 2   CRANBERRY PO, Take 2 tablets by mouth every morning., Disp: , Rfl:    denosumab (PROLIA) 60 MG/ML SOSY injection, Inject 60 mg as directed every 6 (six) months., Disp: , Rfl:    DULoxetine (CYMBALTA) 60 MG capsule, TAKE 1 CAPSULE TWICE DAILY (Patient taking differently: Take 60 mg by mouth 2 (two) times daily.), Disp: 180 capsule, Rfl: 1   EPINEPHrine 0.3 mg/0.3 mL IJ SOAJ injection, Inject 0.3 mg into the muscle once as needed for anaphylaxis (severe allergic reaction)., Disp: , Rfl:    ezetimibe (ZETIA) 10 MG tablet, Take 10 mg by mouth at bedtime., Disp: , Rfl:    feeding supplement (ENSURE ENLIVE / ENSURE PLUS) LIQD, Take 237 mLs by mouth 2 (two) times daily between meals. (Patient taking differently: Take 237 mLs by mouth daily.), Disp: 237 mL, Rfl: 12   ferrous sulfate 325 (65 FE) MG tablet, Take 325 mg by mouth 2 (two) times daily with a meal., Disp: , Rfl:    levothyroxine (SYNTHROID) 125 MCG tablet, Take 1 tablet (125 mcg total) by mouth daily at 6 (six) AM., Disp: 30 tablet, Rfl: 1   loratadine (CLARITIN) 10 MG tablet, Take 10 mg by mouth daily., Disp: , Rfl:    Magnesium 500 MG CAPS, Take 1 capsule (500 mg total) by mouth daily. (Patient taking differently: Take 500 mg by mouth daily with supper.), Disp: 30 capsule, Rfl: 1   Menthol, Topical Analgesic, (BIOFREEZE) 10 % CREA, Apply 1  Application topically daily as needed (Knee pain)., Disp: , Rfl:    metoprolol tartrate (LOPRESSOR) 25 MG tablet, Take 25 mg by mouth 2 (two) times daily., Disp: , Rfl:    Multiple Vitamin (MULTIVITAMIN WITH MINERALS) TABS tablet, Take 1 tablet by mouth every morning. Men's One-A-Day 50+, Disp: , Rfl:    MYRBETRIQ 25 MG TB24 tablet, TAKE 1 TABLET EVERY DAY (Patient taking differently: Take 25 mg by mouth at bedtime.), Disp: 90 tablet, Rfl: 3   nitroGLYCERIN (NITROSTAT) 0.4 MG SL tablet, Place 0.4 mg under the tongue every 5 (five) minutes as needed for chest pain., Disp: , Rfl:    omeprazole (PRILOSEC) 40 MG capsule, TAKE 1 CAPSULE EVERY DAY BEFORE BREAKFAST (Patient taking differently: Take 40 mg by mouth daily.), Disp: 90 capsule, Rfl: 1   Potassium Citrate 15 MEQ (1620 MG) TBCR, Take 1 tablet by mouth See admin instructions. Take one tablet (  15 meq) by mouth twice daily - with lunch and supper (Patient taking differently: Take 1 tablet by mouth in the morning and at bedtime.), Disp: 180 tablet, Rfl: 3   Probiotic Product (PROBIOTIC PO), Take 1 capsule by mouth daily with breakfast., Disp: , Rfl:    rivaroxaban (XARELTO) 20 MG TABS tablet, Take 1 tablet (20 mg total) by mouth daily with supper., Disp: 90 tablet, Rfl: 3   solifenacin (VESICARE) 10 MG tablet, Take 10 mg by mouth daily., Disp: , Rfl:    testosterone enanthate (DELATESTRYL) 200 MG/ML injection, INJECT 1ML ('200MG'$ ) INTO THE MUSCLE EVERY 14 DAYS. DISCARD VIAL AFTER 28 DAYS. (Patient taking differently: Inject 200 mg into the muscle every 14 (fourteen) days. Every other Friday), Disp: 5 mL, Rfl: 3   tiZANidine (ZANAFLEX) 4 MG tablet, Take 4 mg by mouth daily as needed for muscle spasms., Disp: , Rfl:    vitamin C (ASCORBIC ACID) 500 MG tablet, Take 500 mg by mouth daily with lunch., Disp: , Rfl:    HYDROcodone-acetaminophen (NORCO) 7.5-325 MG tablet, Take 1 tablet by mouth every 6 (six) hours as needed for severe pain. (Patient not taking:  Reported on 08/05/2022), Disp: 10 tablet, Rfl: 0   lidocaine-prilocaine (EMLA) cream, Apply 1 Application topically as needed. (Patient taking differently: Apply 1 Application topically as needed (For port access/chemotherapy).), Disp: 30 g, Rfl: 0   Risankizumab-rzaa (SKYRIZI, 150 MG DOSE, [place]), Inject 150 mg into the skin every 4 (four) months. (Patient not taking: Reported on 08/05/2022), Disp: , Rfl:    Review of Systems  Constitutional:  Positive for fatigue. Negative for activity change, appetite change, chills, diaphoresis, fever and unexpected weight change.  HENT:  Negative for congestion, rhinorrhea, sinus pressure, sneezing, sore throat and trouble swallowing.   Eyes:  Negative for photophobia and visual disturbance.  Respiratory:  Negative for cough, chest tightness, shortness of breath, wheezing and stridor.   Cardiovascular:  Negative for chest pain, palpitations and leg swelling.  Gastrointestinal:  Negative for abdominal distention, abdominal pain, anal bleeding, blood in stool, constipation, diarrhea, nausea and vomiting.  Genitourinary:  Negative for difficulty urinating, dysuria, flank pain, frequency and hematuria.  Musculoskeletal:  Negative for arthralgias, back pain, gait problem, joint swelling and myalgias.  Skin:  Negative for color change, pallor, rash and wound.  Neurological:  Positive for weakness. Negative for dizziness, tremors, light-headedness and headaches.  Hematological:  Negative for adenopathy. Does not bruise/bleed easily.  Psychiatric/Behavioral:  Negative for agitation, behavioral problems, confusion, decreased concentration, dysphoric mood, sleep disturbance and suicidal ideas.        Objective:   Physical Exam Constitutional:      Appearance: He is well-developed.  HENT:     Head: Normocephalic and atraumatic.  Eyes:     Conjunctiva/sclera: Conjunctivae normal.  Cardiovascular:     Rate and Rhythm: Normal rate and regular rhythm.  Pulmonary:      Effort: Pulmonary effort is normal. No respiratory distress.     Breath sounds: No wheezing.  Abdominal:     General: There is no distension.     Palpations: Abdomen is soft.  Musculoskeletal:        General: No tenderness. Normal range of motion.     Cervical back: Normal range of motion and neck supple.  Skin:    General: Skin is warm and dry.     Coloration: Skin is pale.     Findings: No erythema or rash.  Neurological:     General: No focal  deficit present.     Mental Status: He is alert and oriented to person, place, and time.  Psychiatric:        Mood and Affect: Mood normal.        Behavior: Behavior normal.        Thought Content: Thought content normal.        Judgment: Judgment normal.     Port is accessed and site is clean          Assessment & Plan:   Colonization w ESBL with episodes of infection though they are not always easy to distinguish from her medical problems and reasons for falls and weakness.  Currently I do not find sufficient evidence to want to initiate treatment for him.  Plan on seeing him back in 1 month  Current falls: Multifactorial but current symptoms and issues do not seem related to infection  Diffuse large cell lymphoma. If he truly is going to undergo CAR T cell therapy this can also certainly lower blood pressure so extreme care will be needed   Vaccine counseling: Up-to-date on his flu and COVID vaccines.

## 2022-08-05 NOTE — Patient Instructions (Signed)
Visit Information  Thank you for taking time to visit with me today. Please don't hesitate to contact me if I can be of assistance to you.   Please see the attached information we discussed today about Palliative Care and falls at home  If you are experiencing a Mental Health or [place] or need someone to talk to, please  call the Suicide and Crisis Lifeline: 988 call the [place] National Suicide Prevention Lifeline: 937-510-4102 or TTY: 856-117-2701 TTY 986-310-2637) to talk to a trained counselor call 1-800-273-TALK (toll free, 24 hour hotline) go to [facility] [address], [place] [phone]) call the [place]: [phone] call 911   Patient verbalizes understanding of instructions and care plan provided today and agrees to view in [facility]. Active MyChart status and patient understanding of how to access instructions and care plan via MyChart confirmed with patient.     No further follow up required: no further or ongoing care coordination needs identified today- caregiver has my direct phone number should needs arise in the future  Oneta Rack, RN, BSN, [facility]/ [facility] [place] [facility] [phone]: direct office

## 2022-08-05 NOTE — Patient Outreach (Signed)
  Care Coordination [facility] Note Transition Care Management Follow-up Telephone Call Date of discharge and from where: Friday, 07/23/22 [facility], orthostatic hypotension, frequent falls How have you been since you were released from the hospital? Per caregiver/ spouse [name], on [facility] DPR: "Things are under control for now.  He has not had any falls since Monday of this week.  I keep telling him along with the home health nurse and PT-- he has got to start listening to everything they tell him about slowing down and breathing through his anxiety/ fear of falling... I can't do it for him, but I am encouraging him to start doing what everybody is telling him to do.  I am considering asking Dr. Alain Marion for palliative care when we see him in a couple of weeks, I am thinking about it.  He got a very good report from Dr. Tommy Medal today" Any questions or concerns? No  Items Reviewed: Did the pt receive and understand the discharge instructions provided? Yes  Medications obtained and verified? Yes  Other? No  Any new allergies since your discharge? No  Dietary orders reviewed? Yes Do you have support at home? Yes  husband [name] provides assistance with ADL's and iADL's   Home Care and Equipment/Supplies: Were home health services ordered? yes If so, what is the name of the agency? [facility]- established prior to hospital discharge; currently active  Has the agency set up a time to come to the patient's home? Established-- coming to home currently Were any new equipment or medical supplies ordered?  No What is the name of the medical supply agency? N/A Were you able to get the supplies/equipment? not applicable Do you have any questions related to the use of the equipment or supplies? No N/A  Functional Questionnaire: (I = Independent and D = Dependent) ADLs: I  husband [name] provides assistance with ADL's and iADL's  Bathing/Dressing- I  husband [name] provides assistance with ADL's and  iADL's  Meal Prep- I  husband [name] provides assistance with ADL's and iADL's  Eating- I  Maintaining continence- I  Transferring/Ambulation- I  husband [name] provides assistance with ADL's and iADL's  Managing Meds- I  husband [name] provides assistance with ADL's and iADL's  Follow up appointments reviewed:  PCP Hospital f/u appt confirmed? Yes  Scheduled to see Dr. Alain Marion, PCP on Monday 08/23/22 @ 11:20 am [facility] f/u appt confirmed? Yes  Scheduled to see Dr. Tommy Medal-- attended office visit today 08/05/22- reviewed visit with caregiver Are transportation arrangements needed? No  If their condition worsens, is the pt aware to call PCP or go to the Emergency Dept.? Yes Was the patient provided with contact information for the PCP's office or ED? Yes Was to pt encouraged to call back with questions or concerns? Yes  SDOH assessments and interventions completed:   Yes  Care Coordination Interventions Activated:  Yes   Care Coordination Interventions:  Provided reinforcement/ education around fall prevention strategies/ risks of falls; provided education and written information through MyChart around palliative care services; extensively reviewed recent medication changes and ensured that all discontinued medications are no longer on patient medication list, and/or have been designated as not taking; provided education around value of consistent blood pressure monitoring at home in setting of orthostatic hypotension    Encounter Outcome:  Pt. Visit Completed    Oneta Rack, RN, BSN, CCRN Alumnus RN CM Care Coordination/ Transition of [place] Management [phone]: direct office

## 2022-08-05 NOTE — Telephone Encounter (Signed)
Called April no answer LMOM w/MD response.Marland KitchenJohny Chess

## 2022-08-05 NOTE — Telephone Encounter (Signed)
Noted.  Okay for verbal order.  Thank you

## 2022-08-06 ENCOUNTER — Other Ambulatory Visit: Payer: Self-pay

## 2022-08-07 ENCOUNTER — Other Ambulatory Visit: Payer: Self-pay | Admitting: Urology

## 2022-08-07 ENCOUNTER — Other Ambulatory Visit: Payer: Self-pay | Admitting: Cardiovascular Disease

## 2022-08-09 NOTE — Telephone Encounter (Signed)
Rx(s) sent to pharmacy electronically.  

## 2022-08-10 DIAGNOSIS — B351 Tinea unguium: Secondary | ICD-10-CM | POA: Diagnosis not present

## 2022-08-10 DIAGNOSIS — I739 Peripheral vascular disease, unspecified: Secondary | ICD-10-CM | POA: Diagnosis not present

## 2022-08-10 DIAGNOSIS — I872 Venous insufficiency (chronic) (peripheral): Secondary | ICD-10-CM | POA: Diagnosis not present

## 2022-08-11 DIAGNOSIS — F1090 Alcohol use, unspecified, uncomplicated: Secondary | ICD-10-CM | POA: Diagnosis not present

## 2022-08-11 DIAGNOSIS — C8330 Diffuse large B-cell lymphoma, unspecified site: Secondary | ICD-10-CM | POA: Diagnosis not present

## 2022-08-11 DIAGNOSIS — Z87891 Personal history of nicotine dependence: Secondary | ICD-10-CM | POA: Diagnosis not present

## 2022-08-11 DIAGNOSIS — Z79624 Long term (current) use of inhibitors of nucleotide synthesis: Secondary | ICD-10-CM | POA: Diagnosis not present

## 2022-08-11 DIAGNOSIS — Z7952 Long term (current) use of systemic steroids: Secondary | ICD-10-CM | POA: Diagnosis not present

## 2022-08-12 ENCOUNTER — Telehealth (HOSPITAL_BASED_OUTPATIENT_CLINIC_OR_DEPARTMENT_OTHER): Payer: Self-pay | Admitting: Cardiovascular Disease

## 2022-08-12 NOTE — Telephone Encounter (Signed)
Pt c/o medication issue:  1. Name of Medication:   levothyroxine (SYNTHROID) 125 MCG tablet   2. How are you currently taking this medication (dosage and times per day)?   As prescribed  3. Are you having a reaction (difficulty breathing--STAT)?   No  4. What is your medication issue?   Caller stated the patient has 125 mcg and 200 mcg dosage of this medication and he would like to know which dosage the patient should be taking.

## 2022-08-12 NOTE — Telephone Encounter (Signed)
Okay to Rx?

## 2022-08-13 ENCOUNTER — Other Ambulatory Visit: Payer: Self-pay

## 2022-08-18 ENCOUNTER — Ambulatory Visit (HOSPITAL_BASED_OUTPATIENT_CLINIC_OR_DEPARTMENT_OTHER): Payer: Medicare PPO | Admitting: Cardiovascular Disease

## 2022-08-18 DIAGNOSIS — C8330 Diffuse large B-cell lymphoma, unspecified site: Secondary | ICD-10-CM | POA: Diagnosis not present

## 2022-08-18 DIAGNOSIS — Z79899 Other long term (current) drug therapy: Secondary | ICD-10-CM | POA: Diagnosis not present

## 2022-08-19 ENCOUNTER — Telehealth: Payer: Self-pay

## 2022-08-19 NOTE — Telephone Encounter (Signed)
April called office back regarding voicemail. Relayed verbal order to do UA  Leatrice Jewels, RMA

## 2022-08-19 NOTE — Telephone Encounter (Signed)
Attempted to call Rn with order to do UA per Dr. Tommy Medal. Left voicemail requesting call back for verbal order.  Leatrice Jewels, RMA

## 2022-08-19 NOTE — Telephone Encounter (Signed)
Received call from April, Rn with [facility] requesting orders for UA. States patient is having symptoms for UIT (burning while urinating and dark color urine). Message sent to provider to advise if okay to give order.  Will call nurse back once provider responds. P: [place], RMA

## 2022-08-23 ENCOUNTER — Ambulatory Visit (INDEPENDENT_AMBULATORY_CARE_PROVIDER_SITE_OTHER): Payer: Medicare PPO | Admitting: Internal Medicine

## 2022-08-23 ENCOUNTER — Encounter: Payer: Self-pay | Admitting: Internal Medicine

## 2022-08-23 VITALS — BP 120/68 | HR 82 | Temp 97.7°F | Ht 76.0 in | Wt 232.0 lb

## 2022-08-23 DIAGNOSIS — M79604 Pain in right leg: Secondary | ICD-10-CM | POA: Diagnosis not present

## 2022-08-23 DIAGNOSIS — I2510 Atherosclerotic heart disease of native coronary artery without angina pectoris: Secondary | ICD-10-CM

## 2022-08-23 DIAGNOSIS — C8338 Diffuse large B-cell lymphoma, lymph nodes of multiple sites: Secondary | ICD-10-CM | POA: Diagnosis not present

## 2022-08-23 DIAGNOSIS — F419 Anxiety disorder, unspecified: Secondary | ICD-10-CM | POA: Diagnosis not present

## 2022-08-23 DIAGNOSIS — M545 Low back pain, unspecified: Secondary | ICD-10-CM

## 2022-08-23 DIAGNOSIS — M79605 Pain in left leg: Secondary | ICD-10-CM

## 2022-08-23 DIAGNOSIS — E039 Hypothyroidism, unspecified: Secondary | ICD-10-CM | POA: Diagnosis not present

## 2022-08-23 DIAGNOSIS — R634 Abnormal weight loss: Secondary | ICD-10-CM

## 2022-08-23 MED ORDER — CLONAZEPAM 1 MG PO TABS: 1.0000 mg | ORAL_TABLET | Freq: Three times a day (TID) | ORAL | 1 refills | Status: DC | PRN

## 2022-08-23 MED ORDER — LEVOTHYROXINE SODIUM 125 MCG PO TABS: 125.0000 ug | ORAL_TABLET | Freq: Every day | ORAL | 3 refills | Status: DC

## 2022-08-23 NOTE — Assessment & Plan Note (Signed)
Clonazepam 1 mg tid prn Rx sent  Potential benefits of a long term benzodiazepines  use as well as potential risks  and complications were explained to the patient and were aknowledged.

## 2022-08-23 NOTE — Progress Notes (Signed)
Subjective:  Patient ID: [name], male    DOB: 1946/05/07  Age: 76 y.o. MRN: [id number]  CC: Follow-up   HPI [name] presents for falls, LBP, CAD. On Rx at [facility] - weak. In PT.Pt lost wt. OV at [facility] weekly w/labs  Outpatient Medications Prior to Visit  Medication Sig Dispense Refill   acetaminophen (TYLENOL) 325 MG tablet Take 2 tablets (650 mg total) by mouth every 6 (six) hours as needed for mild pain (or Fever >/= 101). 20 tablet    albuterol (VENTOLIN HFA) 108 (90 Base) MCG/ACT inhaler Inhale 2 puffs into the lungs every 6 (six) hours as needed for wheezing or shortness of breath.     alfuzosin (UROXATRAL) 10 MG 24 hr tablet TAKE 1 TABLET AT BEDTIME 90 tablet 10   allopurinol (ZYLOPRIM) 100 MG tablet Take 1 tablet (100 mg total) by mouth 2 (two) times daily. 180 tablet 1   atorvastatin (LIPITOR) 80 MG tablet TAKE 1 TABLET EVERY DAY 90 tablet 1   buPROPion (WELLBUTRIN XL) 300 MG 24 hr tablet TAKE 1 TABLET EVERY DAY WITH BREAKFAST (Patient taking differently: Take 300 mg by mouth daily.) 90 tablet 1   Calcium Citrate-Vitamin D (CALCIUM CITRATE + D PO) Take 1 tablet by mouth 2 (two) times daily. '1200mg'$  of Calcium and 1000 units vitamin D3     Cholecalciferol (VITAMIN D) 50 MCG (2000 UT) tablet Take 4,000 Units by mouth daily.     CRANBERRY PO Take 2 tablets by mouth every morning.     denosumab (PROLIA) 60 MG/ML SOSY injection Inject 60 mg as directed every 6 (six) months.     dexamethasone (DECADRON) 4 MG tablet Take 4 mg by mouth in the morning, at noon, in the evening, and at bedtime.     DULoxetine (CYMBALTA) 60 MG capsule TAKE 1 CAPSULE TWICE DAILY (Patient taking differently: Take 60 mg by mouth 2 (two) times daily.) 180 capsule 1   EPINEPHrine 0.3 mg/0.3 mL IJ SOAJ injection Inject 0.3 mg into the muscle once as needed for anaphylaxis (severe allergic reaction).     ezetimibe (ZETIA) 10 MG tablet Take 10 mg by mouth at bedtime.     feeding supplement (ENSURE ENLIVE /  ENSURE PLUS) LIQD Take 237 mLs by mouth 2 (two) times daily between meals. (Patient taking differently: Take 237 mLs by mouth daily.) 237 mL 12   ferrous sulfate 325 (65 FE) MG tablet Take 325 mg by mouth 2 (two) times daily with a meal.     HYDROcodone-acetaminophen (NORCO) 7.5-325 MG tablet Take 1 tablet by mouth every 6 (six) hours as needed for severe pain. 10 tablet 0   lidocaine-prilocaine (EMLA) cream Apply 1 Application topically as needed. (Patient taking differently: Apply 1 Application topically as needed (For port access/chemotherapy).) 30 g 0   loratadine (CLARITIN) 10 MG tablet Take 10 mg by mouth daily.     Magnesium 500 MG CAPS Take 1 capsule (500 mg total) by mouth daily. (Patient taking differently: Take 500 mg by mouth daily with supper.) 30 capsule 1   Menthol, Topical Analgesic, (BIOFREEZE) 10 % CREA Apply 1 Application topically daily as needed (Knee pain).     metoprolol tartrate (LOPRESSOR) 25 MG tablet Take 25 mg by mouth 2 (two) times daily.     metroNIDAZOLE (METROGEL) 1 % gel Apply 1 Application topically daily.     Multiple Vitamin (MULTIVITAMIN WITH MINERALS) TABS tablet Take 1 tablet by mouth every morning. Men's One-A-Day 50+  MYRBETRIQ 25 MG TB24 tablet TAKE 1 TABLET EVERY DAY (Patient taking differently: Take 25 mg by mouth at bedtime.) 90 tablet 3   nitroGLYCERIN (NITROSTAT) 0.4 MG SL tablet Place 0.4 mg under the tongue every 5 (five) minutes as needed for chest pain.     omeprazole (PRILOSEC) 40 MG capsule TAKE 1 CAPSULE EVERY DAY BEFORE BREAKFAST (Patient taking differently: Take 40 mg by mouth daily.) 90 capsule 1   Potassium Citrate 15 MEQ (1620 MG) TBCR Take 1 tablet by mouth See admin instructions. Take one tablet (15 meq) by mouth twice daily - with lunch and supper (Patient taking differently: Take 1 tablet by mouth in the morning and at bedtime.) 180 tablet 3   Probiotic Product (PROBIOTIC PO) Take 1 capsule by mouth daily with breakfast.      Risankizumab-rzaa (SKYRIZI, 150 MG DOSE, [place]) Inject 150 mg into the skin every 4 (four) months.     rivaroxaban (XARELTO) 20 MG TABS tablet Take 1 tablet (20 mg total) by mouth daily with supper. 90 tablet 3   solifenacin (VESICARE) 10 MG tablet Take 10 mg by mouth daily.     testosterone enanthate (DELATESTRYL) 200 MG/ML injection INJECT 1ML ('200MG'$ ) INTO THE MUSCLE EVERY 14 DAYS. DISCARD VIAL AFTER 28 DAYS. (Patient taking differently: Inject 200 mg into the muscle every 14 (fourteen) days. Every other Friday) 5 mL 3   tiZANidine (ZANAFLEX) 4 MG tablet Take 4 mg by mouth daily as needed for muscle spasms.     vitamin C (ASCORBIC ACID) 500 MG tablet Take 500 mg by mouth daily with lunch.     clonazePAM (KLONOPIN) 2 MG tablet Take 0.5 tablets (1 mg total) by mouth 3 (three) times daily as needed for anxiety. 90 tablet 2   levothyroxine (SYNTHROID) 125 MCG tablet Take 1 tablet (125 mcg total) by mouth daily at 6 (six) AM. 30 tablet 1   No facility-administered medications prior to visit.    ROS: Review of Systems  Constitutional:  Positive for fatigue and unexpected weight change. Negative for appetite change.  HENT:  Negative for congestion, nosebleeds, sneezing, sore throat and trouble swallowing.   Eyes:  Negative for itching and visual disturbance.  Respiratory:  Negative for cough.   Cardiovascular:  Negative for chest pain and palpitations.  Gastrointestinal:  Negative for abdominal distention, blood in stool, diarrhea and nausea.  Genitourinary:  Negative for frequency and hematuria.  Musculoskeletal:  Positive for arthralgias, back pain, gait problem and neck stiffness. Negative for joint swelling and neck pain.  Skin:  Negative for rash.  Neurological:  Negative for dizziness, tremors, speech difficulty and weakness.  Psychiatric/Behavioral:  Negative for agitation, dysphoric mood and sleep disturbance. The patient is not nervous/anxious.     Objective:  BP 120/68 (BP Location:  Left Arm, Patient Position: Sitting, Cuff Size: Normal)   Pulse 82   Temp 97.7 F (36.5 C) (Oral)   Ht '6\' 4"'$  (1.93 m)   Wt 232 lb (105.2 kg)   SpO2 99%   BMI 28.24 kg/m   BP Readings from Last 3 Encounters:  08/23/22 120/68  08/05/22 (!) 90/59  07/23/22 121/68    Wt Readings from Last 3 Encounters:  08/23/22 232 lb (105.2 kg)  07/20/22 252 lb (114.3 kg)  06/30/22 254 lb (115.2 kg)    Physical Exam Constitutional:      General: He is not in acute distress.    Appearance: He is well-developed. He is obese.     Comments: NAD  Eyes:     Conjunctiva/sclera: Conjunctivae normal.     Pupils: Pupils are equal, round, and reactive to light.  Neck:     Thyroid: No thyromegaly.     Vascular: No JVD.  Cardiovascular:     Rate and Rhythm: Normal rate and regular rhythm.     Heart sounds: Normal heart sounds. No murmur heard.    No friction rub. No gallop.  Pulmonary:     Effort: Pulmonary effort is normal. No respiratory distress.     Breath sounds: Normal breath sounds. No wheezing or rales.  Chest:     Chest wall: No tenderness.  Abdominal:     General: Bowel sounds are normal. There is no distension.     Palpations: Abdomen is soft. There is no mass.     Tenderness: There is no abdominal tenderness. There is no guarding or rebound.  Musculoskeletal:        General: No tenderness. Normal range of motion.     Cervical back: Normal range of motion.  Lymphadenopathy:     Cervical: No cervical adenopathy.  Skin:    General: Skin is warm and dry.     Findings: No rash.  Neurological:     Mental Status: He is alert and oriented to person, place, and time.     Cranial Nerves: No cranial nerve deficit.     Motor: No abnormal muscle tone.     Coordination: Coordination normal.     Gait: Gait normal.     Deep Tendon Reflexes: Reflexes are normal and symmetric.  Psychiatric:        Behavior: Behavior normal.        Thought Content: Thought content normal.        Judgment:  Judgment normal.   No edema Using a walker  Lab Results  Component Value Date   WBC 5.1 07/23/2022   HGB 12.5 (L) 07/23/2022   HCT 36.6 (L) 07/23/2022   PLT 182 07/23/2022   GLUCOSE 86 07/23/2022   CHOL 102 10/28/2021   TRIG 64 10/28/2021   HDL 45 10/28/2021   LDLCALC 43 10/28/2021   ALT 16 07/23/2022   AST 20 07/23/2022   NA 137 07/23/2022   K 3.5 07/23/2022   CL 102 07/23/2022   CREATININE 1.21 07/23/2022   BUN 21 07/23/2022   CO2 27 07/23/2022   TSH 33.136 (H) 07/22/2022   PSA 1.08 06/26/2020   INR 1.1 06/17/2022   HGBA1C 5.8 (H) 02/06/2013    CT Renal Stone Study  Result Date: 07/21/2022 CLINICAL DATA:  Nephrolithiasis EXAM: CT ABDOMEN AND PELVIS WITHOUT CONTRAST TECHNIQUE: Multidetector CT imaging of the abdomen and pelvis was performed following the standard protocol without IV contrast. RADIATION DOSE REDUCTION: This exam was performed according to the departmental dose-optimization program which includes automated exposure control, adjustment of the mA and/or kV according to patient size and/or use of iterative reconstruction technique. COMPARISON:  PET-CT dated 05/19/2022. CT abdomen/pelvis dated 03/06/2022. FINDINGS: Lower chest: Mild right basilar opacity, likely atelectasis. Hepatobiliary: Unenhanced liver is unremarkable. Gallbladder is unremarkable. No intrahepatic or extrahepatic ductal dilatation. Pancreas: Within normal limits. Spleen: Within normal limits. Adrenals/Urinary Tract: 2.0 cm low-density right adrenal nodule, measuring less than 10 HUs, compatible with a benign adrenal adenoma. No follow-up is recommended. Left adrenal gland is within normal limits. 3 mm nonobstructing interpolar left renal calculus (series 3/image 41). Right kidney is within normal limits. No ureteral or bladder calculi. No hydronephrosis. Bladder is within normal limits. Stomach/Bowel: Stomach  is within normal limits. No evidence of bowel obstruction. Appendix is not discretely  visualized. No colonic wall thickening or inflammatory changes. Vascular/Lymphatic: No evidence of abdominal aortic aneurysm. Atherosclerotic calcifications of the abdominal aorta and branch vessels. No suspicious abdominopelvic lymphadenopathy. Reproductive: Prostate is largely obscured by streak artifact. Other: No abdominopelvic ascites. Small fat containing periumbilical hernia (series 3/image 52). Musculoskeletal: Right hip arthroplasty, status post revision with cerclage wire fixation. Severe compression fracture at L1, with moderate retropulsion and kyphotic deformity, unchanged. Mild degenerative changes. IMPRESSION: 3 mm nonobstructing interpolar left renal calculus. No ureteral or bladder calculi. No hydronephrosis. Additional stable ancillary findings as above. Electronically Signed   By: Julian Hy M.D.   On: 07/21/2022 01:23   CT Head Wo Contrast  Result Date: 07/20/2022 CLINICAL DATA:  Head trauma. Level 2 fall on blood thinners. Several falls today. EXAM: CT HEAD WITHOUT CONTRAST TECHNIQUE: Contiguous axial images were obtained from the base of the skull through the vertex without intravenous contrast. RADIATION DOSE REDUCTION: This exam was performed according to the departmental dose-optimization program which includes automated exposure control, adjustment of the mA and/or kV according to patient size and/or use of iterative reconstruction technique. COMPARISON:  CT head dated June 17, 2022 FINDINGS: Brain: No evidence of acute infarction, hemorrhage, hydrocephalus, extra-axial collection or mass lesion/mass effect. Prominence of the ventricles and sulci secondary to moderate cerebral atrophy. Patchy area of low-attenuation of the periventricular and subcortical white matter presumed chronic microvascular ischemic changes. Vascular: No hyperdense vessel or unexpected calcification. Skull: Normal. Negative for fracture or focal lesion. Sinuses/Orbits: No acute finding. Other: None.  IMPRESSION: 1. No acute intracranial process. 2. Cerebral atrophy and chronic microvascular ischemic changes of the white matter, unchanged. Electronically Signed   By: Keane Police D.O.   On: 07/20/2022 23:48   DG Chest Port 1 View  Result Date: 07/20/2022 CLINICAL DATA:  Fall, chronic anticoagulation EXAM: PORTABLE CHEST 1 VIEW COMPARISON:  06/17/2022 FINDINGS: Lungs are clear. No pneumothorax or pleural effusion. Cardiac size is mildly enlarged, unchanged. Right internal jugular chest port tip noted within the superior vena cava. Pulmonary vascularity is normal. No acute bone abnormality. IMPRESSION: Stable cardiomegaly. Electronically Signed   By: Fidela Salisbury M.D.   On: 07/20/2022 23:37    Assessment & Plan:   Problem List Items Addressed This Visit     Hypothyroidism (Chronic)    On levothyroxine 125 mcg a day Labs at [facility]      Relevant Medications   levothyroxine (SYNTHROID) 125 MCG tablet   Anxiety disorder    Clonazepam 1 mg tid prn Rx sent  Potential benefits of a long term benzodiazepines  use as well as potential risks  and complications were explained to the patient and were aknowledged.       CAD (coronary artery disease)    F/u Dr Oval Linsey Cont w/Xarelto, Zetia, Imdur      Low back pain radiating to both legs (Chronic)    Cont on Norco prn  Potential benefits of a long term opioids use as well as potential risks (i.e. addiction risk, apnea etc) and complications (i.e. Somnolence, constipation and others) were explained to the patient and were aknowledged.  In PT at home      Relevant Medications   dexamethasone (DECADRON) 4 MG tablet   Diffuse large B-cell lymphoma (HCC)    At [facility]:  Medical Oncologist: Dr. Cassell Clement Radiation Oncologist:Dr. Ysidro Evert  A relapsed DLBCL with oncologic history described as above. He was initially diagnosed in  2019 and treated with R-CHOP alone for 6 cycles. He was NED for 3 years and then April 2023 presented with a left buttock  lesion and generalized malaise, at which point a PET/CT identified multifocal progression in the right nasopharynx, right neck, pelvis and groin lymph nodes as well as left buttock cutaneous and soft tissue lesions. He has since received 3 cycles of R GDP that has been complicated by multiple bouts of urosepsis secondary to colonization with ESBL. He saw Dr. Cassell Clement recently for consideration of salvage systemic therapy options and apparently bite versus CAR-T therapy are being considered.  On R-GDP      Relevant Medications   dexamethasone (DECADRON) 4 MG tablet   clonazePAM (KLONOPIN) 1 MG tablet   Weight loss - Primary    Wt Readings from Last 3 Encounters:  08/23/22 232 lb (105.2 kg)  07/20/22 252 lb (114.3 kg)  06/30/22 254 lb (115.2 kg)           Meds ordered this encounter  Medications   levothyroxine (SYNTHROID) 125 MCG tablet    Sig: Take 1 tablet (125 mcg total) by mouth daily at 6 (six) AM.    Dispense:  90 tablet    Refill:  3   clonazePAM (KLONOPIN) 1 MG tablet    Sig: Take 1 tablet (1 mg total) by mouth 3 (three) times daily as needed for anxiety.    Dispense:  270 tablet    Refill:  1      Follow-up: Return in about 3 months (around 11/23/2022) for a follow-up visit.  Walker Kehr, MD

## 2022-08-23 NOTE — Assessment & Plan Note (Signed)
F/u Dr Oval Linsey Cont w/Xarelto, Zetia, Imdur

## 2022-08-23 NOTE — Assessment & Plan Note (Signed)
At [facility]:  Medical Oncologist: Dr. Cassell Clement Radiation Oncologist:Dr. Ysidro Evert  A relapsed DLBCL with oncologic history described as above. He was initially diagnosed in 2019 and treated with R-CHOP alone for 6 cycles. He was NED for 3 years and then April 2023 presented with a left buttock lesion and generalized malaise, at which point a PET/CT identified multifocal progression in the right nasopharynx, right neck, pelvis and groin lymph nodes as well as left buttock cutaneous and soft tissue lesions. He has since received 3 cycles of R GDP that has been complicated by multiple bouts of urosepsis secondary to colonization with ESBL. He saw Dr. Cassell Clement recently for consideration of salvage systemic therapy options and apparently bite versus CAR-T therapy are being considered.  On R-GDP

## 2022-08-23 NOTE — Assessment & Plan Note (Signed)
Cont on Norco prn  Potential benefits of a long term opioids use as well as potential risks (i.e. addiction risk, apnea etc) and complications (i.e. Somnolence, constipation and others) were explained to the patient and were aknowledged.  In PT at home

## 2022-08-23 NOTE — Assessment & Plan Note (Signed)
Wt Readings from Last 3 Encounters:  08/23/22 232 lb (105.2 kg)  07/20/22 252 lb (114.3 kg)  06/30/22 254 lb (115.2 kg)

## 2022-08-23 NOTE — Assessment & Plan Note (Addendum)
On levothyroxine 125 mcg a day Labs at [facility]

## 2022-08-24 ENCOUNTER — Telehealth: Payer: Self-pay | Admitting: Internal Medicine

## 2022-08-24 ENCOUNTER — Other Ambulatory Visit: Payer: Self-pay

## 2022-08-24 NOTE — Telephone Encounter (Signed)
April from [facility] called to give provider some info. She said patient had fall last night, no apparent injuries. Also some blood in urine over the weekend. He will being getting chemo treatment tomorrow and then will be admitted and they will have it addressed at hospital.

## 2022-08-25 DIAGNOSIS — S31829A Unspecified open wound of left buttock, initial encounter: Secondary | ICD-10-CM | POA: Diagnosis not present

## 2022-08-25 DIAGNOSIS — I10 Essential (primary) hypertension: Secondary | ICD-10-CM | POA: Diagnosis not present

## 2022-08-25 DIAGNOSIS — Z8619 Personal history of other infectious and parasitic diseases: Secondary | ICD-10-CM | POA: Diagnosis not present

## 2022-08-25 DIAGNOSIS — E7800 Pure hypercholesterolemia, unspecified: Secondary | ICD-10-CM | POA: Diagnosis not present

## 2022-08-25 DIAGNOSIS — N400 Enlarged prostate without lower urinary tract symptoms: Secondary | ICD-10-CM | POA: Diagnosis not present

## 2022-08-25 DIAGNOSIS — F32A Depression, unspecified: Secondary | ICD-10-CM | POA: Diagnosis not present

## 2022-08-25 DIAGNOSIS — R310 Gross hematuria: Secondary | ICD-10-CM | POA: Diagnosis not present

## 2022-08-25 DIAGNOSIS — E039 Hypothyroidism, unspecified: Secondary | ICD-10-CM | POA: Diagnosis not present

## 2022-08-25 DIAGNOSIS — F418 Other specified anxiety disorders: Secondary | ICD-10-CM | POA: Diagnosis not present

## 2022-08-25 DIAGNOSIS — Z86718 Personal history of other venous thrombosis and embolism: Secondary | ICD-10-CM | POA: Diagnosis not present

## 2022-08-25 DIAGNOSIS — F419 Anxiety disorder, unspecified: Secondary | ICD-10-CM | POA: Diagnosis not present

## 2022-08-25 DIAGNOSIS — Z7901 Long term (current) use of anticoagulants: Secondary | ICD-10-CM | POA: Diagnosis not present

## 2022-08-25 DIAGNOSIS — Z66 Do not resuscitate: Secondary | ICD-10-CM | POA: Diagnosis not present

## 2022-08-25 DIAGNOSIS — L98418 Non-pressure chronic ulcer of buttock with other specified severity: Secondary | ICD-10-CM | POA: Diagnosis not present

## 2022-08-25 DIAGNOSIS — I2510 Atherosclerotic heart disease of native coronary artery without angina pectoris: Secondary | ICD-10-CM | POA: Diagnosis not present

## 2022-08-25 DIAGNOSIS — C8330 Diffuse large B-cell lymphoma, unspecified site: Secondary | ICD-10-CM | POA: Diagnosis not present

## 2022-08-26 NOTE — Telephone Encounter (Signed)
Noted. Thx.

## 2022-08-27 DIAGNOSIS — J3081 Allergic rhinitis due to animal (cat) (dog) hair and dander: Secondary | ICD-10-CM | POA: Diagnosis not present

## 2022-08-27 DIAGNOSIS — J301 Allergic rhinitis due to pollen: Secondary | ICD-10-CM | POA: Diagnosis not present

## 2022-08-27 DIAGNOSIS — J3089 Other allergic rhinitis: Secondary | ICD-10-CM | POA: Diagnosis not present

## 2022-08-31 ENCOUNTER — Ambulatory Visit (INDEPENDENT_AMBULATORY_CARE_PROVIDER_SITE_OTHER): Payer: Medicare PPO | Admitting: Infectious Disease

## 2022-08-31 ENCOUNTER — Other Ambulatory Visit: Payer: Self-pay

## 2022-08-31 ENCOUNTER — Encounter: Payer: Self-pay | Admitting: Infectious Disease

## 2022-08-31 VITALS — BP 115/70 | HR 82 | Temp 96.8°F

## 2022-08-31 DIAGNOSIS — N390 Urinary tract infection, site not specified: Secondary | ICD-10-CM | POA: Diagnosis not present

## 2022-08-31 DIAGNOSIS — N3000 Acute cystitis without hematuria: Secondary | ICD-10-CM | POA: Diagnosis not present

## 2022-08-31 DIAGNOSIS — N401 Enlarged prostate with lower urinary tract symptoms: Secondary | ICD-10-CM | POA: Diagnosis not present

## 2022-08-31 DIAGNOSIS — C8338 Diffuse large B-cell lymphoma, lymph nodes of multiple sites: Secondary | ICD-10-CM

## 2022-08-31 DIAGNOSIS — N138 Other obstructive and reflux uropathy: Secondary | ICD-10-CM

## 2022-08-31 NOTE — Progress Notes (Signed)
Subjective:  Chief complaint: F follow-up for extended spectrum beta-lactamase producing E. coli colonization plus or minus recurrent infection.   Patient ID: [name], male    DOB: 06/10/1946, 76 y.o.   MRN: [id number]  HPI  [name] is a 76 year old Caucasian man with a history of coronary disease unfortunately recurrent diffuse large B-cell lymphoma, history of renal calculi including a staghorn calculus problems with voiding urinary incontinence who is recently in July admitted to the hospital with severe urinary tract infection due to ESBL.  He was given meropenem in the hospital and then sent home with Macrobid.  He was feeling better while taking the meropenem and Macrobid but then began to feel poorly again with malaise.  Note before he was diagnosed with a severe infection he did not have much in the way of urinary symptoms other than polyuria.  He did not have dysuria or suprapubic pain or clear-cut back pain.  However he had begun to become confused and encephalopathic and was calling the wrong numbers and saying things that did not make sense to his friends.  Ultimately his husband was able to convince him to come in to the ER where he was hospitalized he was seen by his oncologist Dr. Irene Limbo who  was concerned by patient's apparent recurrence of malaise and symptoms and he was initiated on meropenem for an 11-day course.  He was able to get this at home via his port and his husband was able to administer antibiotics for the port after teaching from home health.  He was continuing to struggle with multiple medical problems including orthostatic hypotension deconditioning and frequent falls the weekend prior to my seeing him in clinic.  He was given a prescription of Augmentin for presumed urinary tract infection by his primary care physician though is not clear that he has symptoms that are clearly due to urinary tract infection.  One of the falls he had sounds to have been due to  proprioceptive problems he did strike his knee which is has a wound which does not appear overtly infected.  His urine was analyzed at PCPs office but no culture done there is a urine culture done by the home health company which showed E coli    ** S = Susceptible; I = Intermediate; R = Resistant **                    P = Positive; N = Negative             MICS are expressed in micrograms per mL    Antibiotic                 RSLT#1    RSLT#2    RSLT#3    RSLT#4 Amoxicillin/Clavulanic Acid    S Ampicillin                     R Cefazolin                      R Cefepime                       S Ceftriaxone                    R Cefuroxime                     R Ciprofloxacin  R Ertapenem                      S Gentamicin                     R Imipenem                       S Levofloxacin                   R Meropenem                      S Nitrofurantoin                 S Piperacillin/Tazobactam        S Tetracycline                   R Tobramycin                     R Trimethoprim/Sulfa             R  I saw him I was not entirely convinced that he had a urinary tract infection due to the absence of specific urinary symptoms.  I was bothered by the fact that he had fallen 3 times in by some of his other systemic symptoms though it was not clear to me that they were due to an infection.  I decided to err on the side of being aggressive with antibiotics and initiated IV ertapenem I also asked for him to receive a bolus of normal saline.  In the chart he does have an extensive history of kidney stones including a staghorn calculus at one point in time.  In talking to  him at last visit todae he is not sure if he is better or not.  His husband  however tells me that since the biotics have started there has been no more falling and no more episodes of confusion and of [name] not making sense.  Several visits ago he  has had multiple falls and been in the ER on multiple  occasions and now is residing at a skilled nursing facility.     There were multiple potential explanations for his fall including sedation, atrial fibrillation orthostatic hypotension.  Per the ER notes he was on amoxicillin to treat "a urinary tract infection.  He has been seen by [facility] obtained cultures 2 days prior to my last admission which are shown below         At his last visit he told me me today that he can feel some burning coming on a little bit.  His husband also said that the color of the urine and the odor of changed.  Note these latter characters is not something we used to make a diagnosis of urine tract infection.   The diagnosis of infection was not clear-cut we decided to treat him with another 14-day course of ertapenem.   Since I last saw [name] he was again hospitalized in October for recurrent falls. The etiology is not entirely clear but was felt to be multifactorial.  He improved with supportive care in the hospital.  Since I last saw [name] he has begun on CAR-T cell therapy.  This is giving him some low-grade temperatures and some malaise.  He has had some dysuria that was fairly steady but now is on and off again along  with frequency.  He had hematuria about a week ago.  He had a urine culture done at [facility] that grew a corynebacterium urea lytic him.  He currently in my view does not meet any criteria for urinary tract infection.  I will get a urine analysis and culture though in case he has worsening symptoms that merit treatment.     Past Medical History:  Diagnosis Date   Benign localized prostatic hyperplasia with lower urinary tract symptoms (LUTS)    CAD (coronary artery disease) cardiologist--  dr t. Oval Linsey   hx of known CAD obstruction w/ collaterals (cath done @ [facility] 12-31-2011) ;   last cardiac cath 08-13-2019  showed sig. 2V CAD involving proxLAD and CTO of the RCA/  chronic total occlusion midRCA w/ bridging  and L>R collaterals   Diastolic CHF ([place])    dx [id number] hospital admission (followed by pcp)   Diffuse large B cell lymphoma [facility]) oncologist-- dr Irene Limbo--- in remission   dx 08/ 2019 -- bx left tonsill mass-- involving lymph nodes--- completed chemotherapy 10-12-2018   DJD (degenerative joint disease)    Dysthymic disorder    Environmental allergies    GAD (generalized anxiety disorder)    with dysthymia   GERD (gastroesophageal reflux disease)    History of falling    multiple times   History of kidney stones    HLD (hyperlipidemia)    Hyperlipidemia    Hypertension    Hypogonadism in male    OA (osteoarthritis)    OSA on CPAP    cpap machine-settings 17   Osteoporosis with fracture 04/24/2013   Persistent atrial fibrillation ([place])    On Xarelto, cardiologist--- dr Alfonso Patten. [place]   Plaque psoriasis    dermatologist--- dr Harriett Sine--- currently taking otezla   Post-surgical hypothyroidism    followed by pcp---  s/p total thyroidectomy for graves disease in 1987   Wears hearing aid in both ears     Past Surgical History:  Procedure Laterality Date   APPENDECTOMY  2011 approx.    CARDIAC CATHETERIZATION  12-31-2011   dr j. Beatrix Fetters '@[facility]'$    normal LVF w/ multivessel CAD,  occluded RCA w/ colleterals   CATARACT EXTRACTION W/ INTRAOCULAR LENS  IMPLANT, BILATERAL  2016 approx.   COLONOSCOPY     CYSTOSCOPY WITH RETROGRADE PYELOGRAM, URETEROSCOPY AND STENT PLACEMENT Left 11/07/2017   Procedure: CYSTOSCOPY WITH RETROGRADE PYELOGRAM, URETEROSCOPY AND STENT PLACEMENT;  Surgeon: Cleon Gustin, MD;  Location: [facility];  Service: Urology;  Laterality: Left;   CYSTOSCOPY WITH RETROGRADE PYELOGRAM, URETEROSCOPY AND STENT PLACEMENT Right 10/22/2019   Procedure: CYSTOSCOPY WITH RETROGRADE PYELOGRAM, URETEROSCOPY AND STENT PLACEMENT;  Surgeon: Cleon Gustin, MD;  Location: [facility];  Service: Urology;  Laterality: Right;   CYSTOSCOPY/URETEROSCOPY/HOLMIUM  LASER/STENT PLACEMENT Right 03/11/2017   Procedure: CYSTOSCOPY/URETEROSCOPYSTENT PLACEMENT right ureter retrograde pylegram;  Surgeon: Cleon Gustin, MD;  [facility];  Service: Urology;  Laterality: Right;   HARDWARE REMOVAL  10/05/2012   Procedure: HARDWARE REMOVAL;  Surgeon: [name], MD;  [facility];  Service: Orthopedics;  Laterality: Right;  REMOVING  STRYKER  GAMMA NAIL   HARDWARE REMOVAL Right 07/03/2013   Procedure: HARDWARE REMOVAL RIGHT TIBIA ;  Surgeon: Rozanna Box, MD;  Location: [place];  Service: Orthopedics;  Laterality: Right;   HIP CLOSED REDUCTION Right 10/15/2013   Procedure: CLOSED MANIPULATION HIP;  Surgeon: [name], MD;  [facility];  Service: Orthopedics;  Laterality: Right;  HOLMIUM LASER APPLICATION Right 03/19/4853   Procedure: HOLMIUM LASER APPLICATION;  Surgeon: Cleon Gustin, MD;  [facility];  Service: Urology;  Laterality: Right;   HOLMIUM LASER APPLICATION Left 04/06/349   Procedure: HOLMIUM LASER APPLICATION;  Surgeon: Cleon Gustin, MD;  [facility];  Service: Urology;  Laterality: Left;   HOLMIUM LASER APPLICATION Left 0/06/3817   Procedure: HOLMIUM LASER APPLICATION;  Surgeon: Cleon Gustin, MD;  [facility];  Service: Urology;  Laterality: Left;   HOLMIUM LASER APPLICATION Right 2/99/3716   Procedure: HOLMIUM LASER APPLICATION;  Surgeon: Cleon Gustin, MD;  Location: [facility];  Service: Urology;  Laterality: Right;   INCISION AND DRAINAGE HIP Right 11/16/2013   Procedure: IRRIGATION AND DEBRIDEMENT RIGHT HIP;  Surgeon: [name], MD;  [facility];  Service: Orthopedics;  Laterality: Right;   INTRAVASCULAR PRESSURE WIRE/FFR STUDY N/A 08/13/2019   Procedure: INTRAVASCULAR PRESSURE WIRE/FFR STUDY;  Surgeon: Nelva Bush, MD;  Location: [place] [facility];  Service: Cardiovascular;  Laterality: N/A;   IR IMAGING GUIDED PORT INSERTION  06/22/2018   IR NEPHROSTOMY  PLACEMENT LEFT  03/28/2019   IR URETERAL STENT LEFT NEW ACCESS W/O SEP NEPHROSTOMY CATH  10/24/2017   IR URETERAL STENT LEFT NEW ACCESS W/O SEP NEPHROSTOMY CATH  03/26/2019   NEPHROLITHOTOMY Right 02/08/2017   Procedure: NEPHROLITHOTOMY PERCUTANEOUS WITH SURGEON ACCESS;  Surgeon: Cleon Gustin, MD;  [facility];  Service: Urology;  Laterality: Right;   NEPHROLITHOTOMY Left 10/24/2017   Procedure: NEPHROLITHOTOMY PERCUTANEOUS;  Surgeon: Cleon Gustin, MD;  [facility];  Service: Urology;  Laterality: Left;   NEPHROLITHOTOMY Left 03/26/2019   Procedure: NEPHROLITHOTOMY PERCUTANEOUS;  Surgeon: Cleon Gustin, MD;  [facility];  Service: Urology;  Laterality: Left;  2 HRS   NEPHROLITHOTOMY Left 04/17/2019   Procedure: NEPHROLITHOTOMY PERCUTANEOUS;  Surgeon: Cleon Gustin, MD;  [facility];  Service: Urology;  Laterality: Left;  2 HRS   ORIF TIBIA FRACTURE Right 02/06/2013   Procedure: OPEN REDUCTION INTERNAL FIXATION (ORIF) TIBIA FRACTURE WITH IM ROD FIBULA;  Surgeon: Rozanna Box, MD;  Location: [place];  Service: Orthopedics;  Laterality: Right;   ORIF TIBIA FRACTURE Right 07/03/2013   Procedure: RIGHT TIBIA NON UNION REPAIR ;  Surgeon: Rozanna Box, MD;  Location: [place];  Service: Orthopedics;  Laterality: Right;   ORIF WRIST FRACTURE  10/02/2012   Procedure: OPEN REDUCTION INTERNAL FIXATION (ORIF) WRIST FRACTURE;  Surgeon: Roseanne Kaufman, MD;  [facility];  Service: Orthopedics;  Laterality: Right;  WITH   ANTIBIOTIC  CEMENT   ORIF WRIST FRACTURE Left 10/28/2013   Procedure: OPEN REDUCTION INTERNAL FIXATION (ORIF) WRIST FRACTURE with allograft;  Surgeon: Roseanne Kaufman, MD;  [facility];  Service: Orthopedics;  Laterality: Left;  DVR Plate   QUADRICEPS TENDON REPAIR Left 07/15/2017   Procedure: REPAIR QUADRICEP TENDON;  Surgeon: Frederik Pear, MD;  Location: [place];  Service: Orthopedics;  Laterality: Left;   RIGHT/LEFT HEART CATH AND CORONARY ANGIOGRAPHY  N/A 08/13/2019   Procedure: RIGHT/LEFT HEART CATH AND CORONARY ANGIOGRAPHY;  Surgeon: Nelva Bush, MD;  Location: [place] [facility];  Service: Cardiovascular;  Laterality: N/A;   THYROIDECTOMY  02/1986   TOTAL HIP ARTHROPLASTY Right 03-16-2016   '@[facility]'$    TOTAL HIP REVISION  10/05/2012   Procedure: TOTAL HIP REVISION;  Surgeon: [name], MD;  [facility];  Service: Orthopedics;  Laterality: Right;  RIGHT TOTAL HIP REVISION  TOTAL HIP REVISION Right 09/17/2013   Procedure: REVISION RIGHT TOTAL HIP ARTHROPLASTY ;  Surgeon: [name], MD;  [facility];  Service: Orthopedics;  Laterality: Right;   TOTAL HIP REVISION Right 10/26/2013   Procedure: REVISION RIGHT TOTAL HIP ARTHROPLASTY;  Surgeon: [name], MD;  [facility];  Service: Orthopedics;  Laterality: Right;   TOTAL KNEE ARTHROPLASTY Bilateral right 03-15-2011;  left 06-30-2011   TOTAL KNEE REVISION Left 04/11/2017   Procedure: TOTAL KNEE REVISION PATELLA and TIBIA;  Surgeon: Frederik Pear, MD;  Location: [place];  Service: Orthopedics;  Laterality: Left;    Family History  Problem Relation Age of Onset   CAD Father 39   Asthma Father    Alcohol abuse Father    Arthritis Mother    Alcohol abuse Sister       Social History   Socioeconomic History   Marital status: Married    Spouse name: Not on file   Number of children: Not on file   Years of education: Not on file   Highest education level: Not on file  Occupational History   Not on file  Tobacco Use   Smoking status: Former    Packs/day: 1.00    Years: 50.00    Total pack years: 50.00    Types: Cigarettes    Quit date: 01/12/2012    Years since quitting: 10.6   Smokeless tobacco: Never  Vaping Use   Vaping Use: Never used  Substance and Sexual Activity   Alcohol use: Yes    Comment: rarely   Drug use: Never   Sexual activity: Yes  Other Topics Concern   Not on file  Social History Narrative   [place] 2.5 months, went home Feb 21st  slipped and fell on back and developed.  Home PT/OT.  Just started outpatient physical therapy.  Friday night, misstepped.       Pt spouse verbalized pt had fallen 4 times within the last 24 hours     Social Determinants of Health   Financial Resource Strain: Low Risk  (01/07/2022)   Overall Financial Resource Strain (CARDIA)    Difficulty of Paying Living Expenses: Not hard at all  Food Insecurity: No Food Insecurity (07/22/2022)   Hunger Vital Sign    Worried About Running Out of Food in the Last Year: Never true    Ran Out of Food in the Last Year: Never true  Transportation Needs: No Transportation Needs (08/05/2022)   PRAPARE - Hydrologist (Medical): No    Lack of Transportation (Non-Medical): No  Physical Activity: Sufficiently Active (01/07/2022)   Exercise Vital Sign    Days of Exercise per Week: 3 days    Minutes of Exercise per Session: 60 min  Stress: No Stress Concern Present (01/07/2022)   [place]    Feeling of Stress : Not at all  Social Connections: [place] (01/07/2022)   Social Connection and Isolation Panel [NHANES]    Frequency of Communication with Friends and Family: More than three times a week    Frequency of Social Gatherings with Friends and Family: More than three times a week    Attends Religious Services: 1 to 4 times per year    Active Member of [employer] or Organizations: No    Attends Archivist Meetings: 1 to 4 times per year    Marital Status: Married    Allergies  Allergen Reactions  Short Ragweed Pollen Ext Cough     Current Outpatient Medications:    acetaminophen (TYLENOL) 325 MG tablet, Take 2 tablets (650 mg total) by mouth every 6 (six) hours as needed for mild pain (or Fever >/= 101)., Disp: 20 tablet, Rfl:    albuterol (VENTOLIN HFA) 108 (90 Base) MCG/ACT inhaler, Inhale 2 puffs into the lungs every 6 (six) hours as needed  for wheezing or shortness of breath., Disp: , Rfl:    alfuzosin (UROXATRAL) 10 MG 24 hr tablet, TAKE 1 TABLET AT BEDTIME, Disp: 90 tablet, Rfl: 10   allopurinol (ZYLOPRIM) 100 MG tablet, Take 1 tablet (100 mg total) by mouth 2 (two) times daily., Disp: 180 tablet, Rfl: 1   atorvastatin (LIPITOR) 80 MG tablet, TAKE 1 TABLET EVERY DAY, Disp: 90 tablet, Rfl: 1   buPROPion (WELLBUTRIN XL) 300 MG 24 hr tablet, TAKE 1 TABLET EVERY DAY WITH BREAKFAST (Patient taking differently: Take 300 mg by mouth daily.), Disp: 90 tablet, Rfl: 1   Calcium Citrate-Vitamin D (CALCIUM CITRATE + D PO), Take 1 tablet by mouth 2 (two) times daily. '1200mg'$  of Calcium and 1000 units vitamin D3, Disp: , Rfl:    Cholecalciferol (VITAMIN D) 50 MCG (2000 UT) tablet, Take 4,000 Units by mouth daily., Disp: , Rfl:    clonazePAM (KLONOPIN) 1 MG tablet, Take 1 tablet (1 mg total) by mouth 3 (three) times daily as needed for anxiety., Disp: 270 tablet, Rfl: 1   CRANBERRY PO, Take 2 tablets by mouth every morning., Disp: , Rfl:    denosumab (PROLIA) 60 MG/ML SOSY injection, Inject 60 mg as directed every 6 (six) months., Disp: , Rfl:    dexamethasone (DECADRON) 4 MG tablet, Take 4 mg by mouth in the morning, at noon, in the evening, and at bedtime., Disp: , Rfl:    DULoxetine (CYMBALTA) 60 MG capsule, TAKE 1 CAPSULE TWICE DAILY (Patient taking differently: Take 60 mg by mouth 2 (two) times daily.), Disp: 180 capsule, Rfl: 1   EPINEPHrine 0.3 mg/0.3 mL IJ SOAJ injection, Inject 0.3 mg into the muscle once as needed for anaphylaxis (severe allergic reaction)., Disp: , Rfl:    ezetimibe (ZETIA) 10 MG tablet, Take 10 mg by mouth at bedtime., Disp: , Rfl:    feeding supplement (ENSURE ENLIVE / ENSURE PLUS) LIQD, Take 237 mLs by mouth 2 (two) times daily between meals. (Patient taking differently: Take 237 mLs by mouth daily.), Disp: 237 mL, Rfl: 12   ferrous sulfate 325 (65 FE) MG tablet, Take 325 mg by mouth 2 (two) times daily with a meal.,  Disp: , Rfl:    HYDROcodone-acetaminophen (NORCO) 7.5-325 MG tablet, Take 1 tablet by mouth every 6 (six) hours as needed for severe pain., Disp: 10 tablet, Rfl: 0   levothyroxine (SYNTHROID) 125 MCG tablet, Take 1 tablet (125 mcg total) by mouth daily at 6 (six) AM., Disp: 90 tablet, Rfl: 3   lidocaine-prilocaine (EMLA) cream, Apply 1 Application topically as needed. (Patient taking differently: Apply 1 Application topically as needed (For port access/chemotherapy).), Disp: 30 g, Rfl: 0   loratadine (CLARITIN) 10 MG tablet, Take 10 mg by mouth daily., Disp: , Rfl:    Magnesium 500 MG CAPS, Take 1 capsule (500 mg total) by mouth daily. (Patient taking differently: Take 500 mg by mouth daily with supper.), Disp: 30 capsule, Rfl: 1   Menthol, Topical Analgesic, (BIOFREEZE) 10 % CREA, Apply 1 Application topically daily as needed (Knee pain)., Disp: , Rfl:    metoprolol tartrate (  LOPRESSOR) 25 MG tablet, Take 25 mg by mouth 2 (two) times daily., Disp: , Rfl:    metroNIDAZOLE (METROGEL) 1 % gel, Apply 1 Application topically daily., Disp: , Rfl:    Multiple Vitamin (MULTIVITAMIN WITH MINERALS) TABS tablet, Take 1 tablet by mouth every morning. Men's One-A-Day 50+, Disp: , Rfl:    MYRBETRIQ 25 MG TB24 tablet, TAKE 1 TABLET EVERY DAY (Patient taking differently: Take 25 mg by mouth at bedtime.), Disp: 90 tablet, Rfl: 3   nitroGLYCERIN (NITROSTAT) 0.4 MG SL tablet, Place 0.4 mg under the tongue every 5 (five) minutes as needed for chest pain., Disp: , Rfl:    omeprazole (PRILOSEC) 40 MG capsule, TAKE 1 CAPSULE EVERY DAY BEFORE BREAKFAST (Patient taking differently: Take 40 mg by mouth daily.), Disp: 90 capsule, Rfl: 1   Potassium Citrate 15 MEQ (1620 MG) TBCR, Take 1 tablet by mouth See admin instructions. Take one tablet (15 meq) by mouth twice daily - with lunch and supper (Patient taking differently: Take 1 tablet by mouth in the morning and at bedtime.), Disp: 180 tablet, Rfl: 3   Probiotic Product  (PROBIOTIC PO), Take 1 capsule by mouth daily with breakfast., Disp: , Rfl:    Risankizumab-rzaa (SKYRIZI, 150 MG DOSE, [place]), Inject 150 mg into the skin every 4 (four) months., Disp: , Rfl:    rivaroxaban (XARELTO) 20 MG TABS tablet, Take 1 tablet (20 mg total) by mouth daily with supper., Disp: 90 tablet, Rfl: 3   solifenacin (VESICARE) 10 MG tablet, Take 10 mg by mouth daily., Disp: , Rfl:    testosterone enanthate (DELATESTRYL) 200 MG/ML injection, INJECT 1ML ('200MG'$ ) INTO THE MUSCLE EVERY 14 DAYS. DISCARD VIAL AFTER 28 DAYS. (Patient taking differently: Inject 200 mg into the muscle every 14 (fourteen) days. Every other Friday), Disp: 5 mL, Rfl: 3   tiZANidine (ZANAFLEX) 4 MG tablet, Take 4 mg by mouth daily as needed for muscle spasms., Disp: , Rfl:    vitamin C (ASCORBIC ACID) 500 MG tablet, Take 500 mg by mouth daily with lunch., Disp: , Rfl:    Review of Systems  Constitutional:  Positive for fatigue. Negative for activity change, appetite change, chills, diaphoresis, fever and unexpected weight change.  HENT:  Negative for congestion, rhinorrhea, sinus pressure, sneezing, sore throat and trouble swallowing.   Eyes:  Negative for photophobia and visual disturbance.  Respiratory:  Negative for cough, chest tightness, shortness of breath, wheezing and stridor.   Cardiovascular:  Negative for chest pain, palpitations and leg swelling.  Gastrointestinal:  Negative for abdominal distention, abdominal pain, anal bleeding, blood in stool, constipation, diarrhea, nausea and vomiting.  Genitourinary:  Positive for frequency. Negative for difficulty urinating, dysuria, flank pain and hematuria.  Musculoskeletal:  Negative for arthralgias, back pain, gait problem, joint swelling and myalgias.  Skin:  Negative for color change, pallor, rash and wound.  Neurological:  Negative for dizziness, tremors, weakness and light-headedness.  Hematological:  Negative for adenopathy. Does not bruise/bleed easily.   Psychiatric/Behavioral:  Negative for agitation, behavioral problems, confusion, decreased concentration, dysphoric mood and sleep disturbance.        Objective:   Physical Exam Constitutional:      Appearance: He is well-developed.  HENT:     Head: Normocephalic and atraumatic.  Eyes:     Conjunctiva/sclera: Conjunctivae normal.  Cardiovascular:     Rate and Rhythm: Normal rate and regular rhythm.  Pulmonary:     Effort: Pulmonary effort is normal. No respiratory distress.  Breath sounds: No wheezing.  Abdominal:     General: There is no distension.     Palpations: Abdomen is soft.  Musculoskeletal:        General: No tenderness. Normal range of motion.     Cervical back: Normal range of motion and neck supple.  Skin:    General: Skin is warm and dry.     Coloration: Skin is pale.     Findings: No erythema or rash.  Neurological:     General: No focal deficit present.     Mental Status: He is alert and oriented to person, place, and time.  Psychiatric:        Mood and Affect: Mood normal.        Behavior: Behavior normal.        Thought Content: Thought content normal.        Judgment: Judgment normal.     Port is accessed and site is clean          Assessment & Plan:  Patient with ESBL with episodes of infection:  Recently he grew this corynebacterium urea [place] him from culture though I am very skeptical that this represents a pathogen given that this was done a week ago and he has had no progression of symptoms.  Check a urine analysis and culture today in case he has symptoms that develop over the next week that are worsening and prompted [place] to treat him.   Diffuse large cell lymphoma: Getting CAR-T cell therapy: This can certainly cause fevers and systemic symptoms.  He seems to be tolerating it fairly well at present  Vaccine counseling he is up-to-date on his vaccines.

## 2022-09-01 ENCOUNTER — Telehealth: Payer: Self-pay | Admitting: Internal Medicine

## 2022-09-01 DIAGNOSIS — R7989 Other specified abnormal findings of blood chemistry: Secondary | ICD-10-CM | POA: Diagnosis not present

## 2022-09-01 DIAGNOSIS — C8330 Diffuse large B-cell lymphoma, unspecified site: Secondary | ICD-10-CM | POA: Diagnosis not present

## 2022-09-01 LAB — URINE CULTURE
MICRO NUMBER:: 14218946
SPECIMEN QUALITY:: ADEQUATE

## 2022-09-01 LAB — URINALYSIS, ROUTINE W REFLEX MICROSCOPIC
Bilirubin Urine: NEGATIVE
Hgb urine dipstick: NEGATIVE
Ketones, ur: NEGATIVE
Nitrite: NEGATIVE
Specific Gravity, Urine: 1.017 (ref 1.001–1.035)
pH: >=8.5 — AB (ref 5.0–8.0)

## 2022-09-01 LAB — MICROSCOPIC MESSAGE

## 2022-09-01 NOTE — Telephone Encounter (Signed)
[place] Name: [place] Agency Name: [facility] Phone #: [phone] Secure line  Service Requested: Wound care (examples: OT/PT/Skilled Nursing/Social Work/Speech Therapy/Wound Care)  Frequency of Visits: once a week for 9 weeks

## 2022-09-02 NOTE — Telephone Encounter (Signed)
Okay. Thank you.

## 2022-09-03 DIAGNOSIS — M810 Age-related osteoporosis without current pathological fracture: Secondary | ICD-10-CM | POA: Diagnosis not present

## 2022-09-05 ENCOUNTER — Emergency Department (HOSPITAL_COMMUNITY): Payer: Medicare PPO

## 2022-09-05 ENCOUNTER — Other Ambulatory Visit: Payer: Self-pay

## 2022-09-05 ENCOUNTER — Observation Stay (HOSPITAL_COMMUNITY)
Admission: EM | Admit: 2022-09-05 | Discharge: 2022-09-07 | Disposition: A | Discharge status: Home Health | Payer: Medicare PPO | Attending: Internal Medicine | Admitting: Internal Medicine

## 2022-09-05 DIAGNOSIS — N1831 Chronic kidney disease, stage 3a: Secondary | ICD-10-CM | POA: Diagnosis not present

## 2022-09-05 DIAGNOSIS — N390 Urinary tract infection, site not specified: Secondary | ICD-10-CM | POA: Diagnosis not present

## 2022-09-05 DIAGNOSIS — D3501 Benign neoplasm of right adrenal gland: Secondary | ICD-10-CM | POA: Diagnosis not present

## 2022-09-05 DIAGNOSIS — N179 Acute kidney failure, unspecified: Secondary | ICD-10-CM | POA: Diagnosis present

## 2022-09-05 DIAGNOSIS — N281 Cyst of kidney, acquired: Secondary | ICD-10-CM | POA: Diagnosis not present

## 2022-09-05 DIAGNOSIS — N1832 Chronic kidney disease, stage 3b: Secondary | ICD-10-CM | POA: Diagnosis not present

## 2022-09-05 DIAGNOSIS — R000 Tachycardia, unspecified: Secondary | ICD-10-CM | POA: Diagnosis not present

## 2022-09-05 DIAGNOSIS — Z7901 Long term (current) use of anticoagulants: Secondary | ICD-10-CM | POA: Insufficient documentation

## 2022-09-05 DIAGNOSIS — J449 Chronic obstructive pulmonary disease, unspecified: Secondary | ICD-10-CM | POA: Diagnosis not present

## 2022-09-05 DIAGNOSIS — I5022 Chronic systolic (congestive) heart failure: Secondary | ICD-10-CM | POA: Diagnosis present

## 2022-09-05 DIAGNOSIS — R55 Syncope and collapse: Secondary | ICD-10-CM | POA: Diagnosis not present

## 2022-09-05 DIAGNOSIS — I130 Hypertensive heart and chronic kidney disease with heart failure and stage 1 through stage 4 chronic kidney disease, or unspecified chronic kidney disease: Secondary | ICD-10-CM | POA: Diagnosis not present

## 2022-09-05 DIAGNOSIS — I480 Paroxysmal atrial fibrillation: Secondary | ICD-10-CM | POA: Diagnosis not present

## 2022-09-05 DIAGNOSIS — B964 Proteus (mirabilis) (morganii) as the cause of diseases classified elsewhere: Secondary | ICD-10-CM | POA: Diagnosis not present

## 2022-09-05 DIAGNOSIS — E039 Hypothyroidism, unspecified: Secondary | ICD-10-CM | POA: Diagnosis not present

## 2022-09-05 DIAGNOSIS — N3289 Other specified disorders of bladder: Secondary | ICD-10-CM | POA: Diagnosis not present

## 2022-09-05 DIAGNOSIS — I959 Hypotension, unspecified: Secondary | ICD-10-CM | POA: Diagnosis not present

## 2022-09-05 DIAGNOSIS — I5032 Chronic diastolic (congestive) heart failure: Secondary | ICD-10-CM | POA: Diagnosis not present

## 2022-09-05 DIAGNOSIS — C8338 Diffuse large B-cell lymphoma, lymph nodes of multiple sites: Secondary | ICD-10-CM

## 2022-09-05 DIAGNOSIS — R0689 Other abnormalities of breathing: Secondary | ICD-10-CM | POA: Diagnosis not present

## 2022-09-05 DIAGNOSIS — Z1152 Encounter for screening for COVID-19: Secondary | ICD-10-CM | POA: Insufficient documentation

## 2022-09-05 DIAGNOSIS — I2510 Atherosclerotic heart disease of native coronary artery without angina pectoris: Secondary | ICD-10-CM | POA: Diagnosis not present

## 2022-09-05 DIAGNOSIS — N200 Calculus of kidney: Secondary | ICD-10-CM | POA: Diagnosis not present

## 2022-09-05 DIAGNOSIS — C8330 Diffuse large B-cell lymphoma, unspecified site: Secondary | ICD-10-CM | POA: Diagnosis present

## 2022-09-05 DIAGNOSIS — S0990XA Unspecified injury of head, initial encounter: Secondary | ICD-10-CM | POA: Diagnosis not present

## 2022-09-05 DIAGNOSIS — I5042 Chronic combined systolic (congestive) and diastolic (congestive) heart failure: Secondary | ICD-10-CM | POA: Diagnosis not present

## 2022-09-05 DIAGNOSIS — R531 Weakness: Secondary | ICD-10-CM

## 2022-09-05 DIAGNOSIS — Z79899 Other long term (current) drug therapy: Secondary | ICD-10-CM | POA: Diagnosis not present

## 2022-09-05 DIAGNOSIS — Z87440 Personal history of urinary (tract) infections: Secondary | ICD-10-CM | POA: Diagnosis present

## 2022-09-05 DIAGNOSIS — I10 Essential (primary) hypertension: Secondary | ICD-10-CM | POA: Diagnosis not present

## 2022-09-05 DIAGNOSIS — T68XXXA Hypothermia, initial encounter: Secondary | ICD-10-CM | POA: Diagnosis not present

## 2022-09-05 DIAGNOSIS — M79673 Pain in unspecified foot: Secondary | ICD-10-CM | POA: Diagnosis present

## 2022-09-05 DIAGNOSIS — D649 Anemia, unspecified: Secondary | ICD-10-CM | POA: Diagnosis present

## 2022-09-05 DIAGNOSIS — R9431 Abnormal electrocardiogram [ECG] [EKG]: Secondary | ICD-10-CM | POA: Diagnosis not present

## 2022-09-05 DIAGNOSIS — Z0389 Encounter for observation for other suspected diseases and conditions ruled out: Secondary | ICD-10-CM | POA: Diagnosis not present

## 2022-09-05 LAB — BLOOD GAS, VENOUS
Acid-Base Excess: 4 mmol/L — ABNORMAL HIGH (ref 0.0–2.0)
Bicarbonate: 29.2 mmol/L — ABNORMAL HIGH (ref 20.0–28.0)
O2 Saturation: 37.1 %
Patient temperature: 37
pCO2, Ven: 45 mmHg (ref 44–60)
pH, Ven: 7.42 (ref 7.25–7.43)
pO2, Ven: 31 mmHg — CL (ref 32–45)

## 2022-09-05 LAB — CBC WITH DIFFERENTIAL/PLATELET
Abs Immature Granulocytes: 0.27 10*3/uL — ABNORMAL HIGH (ref 0.00–0.07)
Basophils Absolute: 0 10*3/uL (ref 0.0–0.1)
Basophils Relative: 0 %
Eosinophils Absolute: 0 10*3/uL (ref 0.0–0.5)
Eosinophils Relative: 0 %
HCT: 46.8 % (ref 39.0–52.0)
Hemoglobin: 15.1 g/dL (ref 13.0–17.0)
Immature Granulocytes: 3 %
Lymphocytes Relative: 10 %
Lymphs Abs: 0.9 10*3/uL (ref 0.7–4.0)
MCH: 30.5 pg (ref 26.0–34.0)
MCHC: 32.3 g/dL (ref 30.0–36.0)
MCV: 94.5 fL (ref 80.0–100.0)
Monocytes Absolute: 0.4 10*3/uL (ref 0.1–1.0)
Monocytes Relative: 4 %
Neutro Abs: 7.9 10*3/uL — ABNORMAL HIGH (ref 1.7–7.7)
Neutrophils Relative %: 83 %
Platelets: 219 10*3/uL (ref 150–400)
RBC: 4.95 MIL/uL (ref 4.22–5.81)
RDW: 18.2 % — ABNORMAL HIGH (ref 11.5–15.5)
WBC: 9.5 10*3/uL (ref 4.0–10.5)
nRBC: 0 % (ref 0.0–0.2)

## 2022-09-05 LAB — URINALYSIS, ROUTINE W REFLEX MICROSCOPIC
Bilirubin Urine: NEGATIVE
Glucose, UA: NEGATIVE mg/dL
Ketones, ur: NEGATIVE mg/dL
Nitrite: NEGATIVE
Protein, ur: >=300 mg/dL — AB
RBC / HPF: >50 RBC/hpf — ABNORMAL HIGH (ref 0–5)
Specific Gravity, Urine: 1.013 (ref 1.005–1.030)
WBC, UA: >50 WBC/hpf — ABNORMAL HIGH (ref 0–5)
pH: 8 (ref 5.0–8.0)

## 2022-09-05 LAB — COMPREHENSIVE METABOLIC PANEL
ALT: 30 U/L (ref 0–44)
AST: 25 U/L (ref 15–41)
Albumin: 3 g/dL — ABNORMAL LOW (ref 3.5–5.0)
Alkaline Phosphatase: 58 U/L (ref 38–126)
Anion gap: 8 (ref 5–15)
BUN: 46 mg/dL — ABNORMAL HIGH (ref 8–23)
CO2: 23 mmol/L (ref 22–32)
Calcium: 8.6 mg/dL — ABNORMAL LOW (ref 8.9–10.3)
Chloride: 107 mmol/L (ref 98–111)
Creatinine, Ser: 1.78 mg/dL — ABNORMAL HIGH (ref 0.61–1.24)
GFR, Estimated: 39 mL/min — ABNORMAL LOW (ref >60)
Glucose, Bld: 101 mg/dL — ABNORMAL HIGH (ref 70–99)
Potassium: 4.6 mmol/L (ref 3.5–5.1)
Sodium: 138 mmol/L (ref 135–145)
Total Bilirubin: 1.2 mg/dL (ref 0.3–1.2)
Total Protein: 6.1 g/dL — ABNORMAL LOW (ref 6.5–8.1)

## 2022-09-05 LAB — PROTIME-INR
INR: 1.1 (ref 0.8–1.2)
Prothrombin Time: 14.1 seconds (ref 11.4–15.2)

## 2022-09-05 LAB — LACTIC ACID, PLASMA
Lactic Acid, Venous: 2.2 mmol/L (ref 0.5–1.9)
Lactic Acid, Venous: 1.8 mmol/L (ref 0.5–1.9)

## 2022-09-05 LAB — RESP PANEL BY RT-PCR (FLU A&B, COVID) ARPGX2
Influenza A by PCR: NEGATIVE
Influenza B by PCR: NEGATIVE
SARS Coronavirus 2 by RT PCR: NEGATIVE

## 2022-09-05 LAB — SODIUM, URINE, RANDOM: Sodium, Ur: 94 mmol/L

## 2022-09-05 LAB — APTT: aPTT: 28 seconds (ref 24–36)

## 2022-09-05 LAB — CREATININE, URINE, RANDOM: Creatinine, Urine: 88 mg/dL

## 2022-09-05 MED ORDER — LACTATED RINGERS IV BOLUS
1000.0000 mL | Freq: Once | INTRAVENOUS | Status: AC
  Administered 2022-09-05: 1000 mL via INTRAVENOUS

## 2022-09-05 MED ORDER — LACTATED RINGERS IV SOLN: INTRAVENOUS | Status: DC

## 2022-09-05 NOTE — ED Triage Notes (Addendum)
BIB EMS due to fall last night, rt foot/toe injury, pt feels he is getting weaker with his activities. A-fib 80-120-116/72- 20- CBG 117- # 20 L FA, 250 cc NS  Pt states the reason he is here is he passed out this morning. Has rash type injury to foot from dragging on floor when trying to get up. Pt states he falls often. This morning included syncope.

## 2022-09-05 NOTE — Assessment & Plan Note (Signed)
Chronic stable continue to monitor 

## 2022-09-05 NOTE — Assessment & Plan Note (Signed)
Check TSH and continue synthroid 125 mcg po daily  Dose has been changed recently will need to clarify in AM

## 2022-09-05 NOTE — ED Provider Notes (Signed)
[facility] DEPT Provider Note   CSN: [id number] Arrival date & time: 09/05/22  1625     History  Chief Complaint  Patient presents with   Foot Pain   Fall    [name] is a 76 y.o. male.  76 year old male presents after having a witnessed fall just prior to arrival.  Patient states that he became dizzy and lightheaded while using his walker.  Fall to the floor but did not strike his head.  States that he has not had any emesis or diarrhea recently.  No chest or abdominal discomfort.  Does take Xarelto.  Normally has to drag his foot and denies any injury to that area.  Other than an abrasion.  History of UTI and has noted some hematuria for the past week and a half.  No prior history of bladder cancer according to the patient.  Denies any flank pain.  No fever or chills.  No cough or congestion.  Denies any dysuria.       Home Medications Prior to Admission medications   Medication Sig Start Date End Date Taking? Authorizing Provider  acetaminophen (TYLENOL) 325 MG tablet Take 2 tablets (650 mg total) by mouth every 6 (six) hours as needed for mild pain (or Fever >/= 101). 03/25/22   Raiford Noble Latif, DO  albuterol (VENTOLIN HFA) 108 (90 Base) MCG/ACT inhaler Inhale 2 puffs into the lungs every 6 (six) hours as needed for wheezing or shortness of breath.    [provider]  alfuzosin (UROXATRAL) 10 MG 24 hr tablet TAKE 1 TABLET AT BEDTIME 08/10/22   McKenzie, Candee Furbish, MD  allopurinol (ZYLOPRIM) 100 MG tablet Take 1 tablet (100 mg total) by mouth 2 (two) times daily. 06/30/22   Truman Hayward, MD  atorvastatin (LIPITOR) 80 MG tablet TAKE 1 TABLET EVERY DAY 08/09/22   Loel Dubonnet, NP  buPROPion (WELLBUTRIN XL) 300 MG 24 hr tablet TAKE 1 TABLET EVERY DAY WITH BREAKFAST Patient taking differently: Take 300 mg by mouth daily. 07/19/22   Plotnikov, Evie Lacks, MD  Calcium Citrate-Vitamin D (CALCIUM CITRATE + D PO) Take 1 tablet  by mouth 2 (two) times daily. '1200mg'$  of Calcium and 1000 units vitamin D3    [provider]  Cholecalciferol (VITAMIN D) 50 MCG (2000 UT) tablet Take 4,000 Units by mouth daily.    [provider]  clonazePAM (KLONOPIN) 1 MG tablet Take 1 tablet (1 mg total) by mouth 3 (three) times daily as needed for anxiety. 08/23/22   Plotnikov, Evie Lacks, MD  CRANBERRY PO Take 2 tablets by mouth every morning.    [provider]  denosumab (PROLIA) 60 MG/ML SOSY injection Inject 60 mg as directed every 6 (six) months. 12/10/15   [provider]  dexamethasone (DECADRON) 4 MG tablet Take 4 mg by mouth in the morning, at noon, in the evening, and at bedtime. 08/11/22   [provider]  DULoxetine (CYMBALTA) 60 MG capsule TAKE 1 CAPSULE TWICE DAILY Patient taking differently: Take 60 mg by mouth 2 (two) times daily. 07/19/22   Plotnikov, Evie Lacks, MD  EPINEPHrine 0.3 mg/0.3 mL IJ SOAJ injection Inject 0.3 mg into the muscle once as needed for anaphylaxis (severe allergic reaction). 07/31/21   [provider]  ezetimibe (ZETIA) 10 MG tablet Take 10 mg by mouth at bedtime.    [provider]  feeding supplement (ENSURE ENLIVE / ENSURE PLUS) LIQD Take 237 mLs by mouth 2 (two)  times daily between meals. Patient taking differently: Take 237 mLs by mouth daily. 03/25/22   Raiford Noble Latif, DO  ferrous sulfate 325 (65 FE) MG tablet Take 325 mg by mouth 2 (two) times daily with a meal.    [provider]  HYDROcodone-acetaminophen (NORCO) 7.5-325 MG tablet Take 1 tablet by mouth every 6 (six) hours as needed for severe pain. 03/26/22   Raiford Noble Latif, DO  levothyroxine (SYNTHROID) 125 MCG tablet Take 1 tablet (125 mcg total) by mouth daily at 6 (six) AM. 08/23/22   Plotnikov, Evie Lacks, MD  lidocaine-prilocaine (EMLA) cream Apply 1 Application topically as needed. Patient taking differently: Apply 1 Application topically as needed (For port  access/chemotherapy). 04/21/22   Brunetta Genera, MD  loratadine (CLARITIN) 10 MG tablet Take 10 mg by mouth daily.    [provider]  Magnesium 500 MG CAPS Take 1 capsule (500 mg total) by mouth daily. Patient taking differently: Take 500 mg by mouth daily with supper. 10/13/18   Brunetta Genera, MD  Menthol, Topical Analgesic, (BIOFREEZE) 10 % CREA Apply 1 Application topically daily as needed (Knee pain).    [provider]  metoprolol tartrate (LOPRESSOR) 25 MG tablet Take 25 mg by mouth 2 (two) times daily.    [provider]  metroNIDAZOLE (METROGEL) 1 % gel Apply 1 Application topically daily. Patient not taking: Reported on 08/31/2022 08/02/22   [provider]  Multiple Vitamin (MULTIVITAMIN WITH MINERALS) TABS tablet Take 1 tablet by mouth every morning. Men's One-A-Day 50+    [provider]  MYRBETRIQ 25 MG TB24 tablet TAKE 1 TABLET EVERY DAY Patient taking differently: Take 25 mg by mouth at bedtime. 10/06/21   McKenzie, Candee Furbish, MD  nitroGLYCERIN (NITROSTAT) 0.4 MG SL tablet Place 0.4 mg under the tongue every 5 (five) minutes as needed for chest pain.    [provider]  omeprazole (PRILOSEC) 40 MG capsule TAKE 1 CAPSULE EVERY DAY BEFORE BREAKFAST Patient taking differently: Take 40 mg by mouth daily. 07/19/22   Plotnikov, Evie Lacks, MD  Potassium Citrate 15 MEQ (1620 MG) TBCR Take 1 tablet by mouth See admin instructions. Take one tablet (15 meq) by mouth twice daily - with lunch and supper Patient taking differently: Take 1 tablet by mouth in the morning and at bedtime. 06/10/22   McKenzie, Candee Furbish, MD  Probiotic Product (PROBIOTIC PO) Take 1 capsule by mouth daily with breakfast.    [provider]  Risankizumab-rzaa (SKYRIZI, 150 MG DOSE, [place]) Inject 150 mg into the skin every 4 (four) months.    [provider]  rivaroxaban (XARELTO) 20 MG TABS tablet Take 1 tablet (20 mg total) by mouth daily with  supper. 05/26/22   Loel Dubonnet, NP  solifenacin (VESICARE) 10 MG tablet Take 10 mg by mouth daily.    [provider]  testosterone enanthate (DELATESTRYL) 200 MG/ML injection INJECT 1ML ('200MG'$ ) INTO THE MUSCLE EVERY 14 DAYS. DISCARD VIAL AFTER 28 DAYS. Patient taking differently: Inject 200 mg into the muscle every 14 (fourteen) days. Every other Friday 09/22/21   Plotnikov, Evie Lacks, MD  tiZANidine (ZANAFLEX) 4 MG tablet Take 4 mg by mouth daily as needed for muscle spasms.    [provider]  vitamin C (ASCORBIC ACID) 500 MG tablet Take 500 mg by mouth daily with lunch.    [provider]      Allergies    Short ragweed pollen ext    Review of  Systems   Review of Systems  All other systems reviewed and are negative.   Physical Exam Updated Vital Signs BP 122/73   Pulse (!) 30   Temp 97.8 F (36.6 C) (Oral)   Resp 20   Ht 1.93 m ('6\' 4"'$ )   Wt 105.2 kg   SpO2 96%   BMI 28.24 kg/m  Physical Exam Vitals and nursing note reviewed.  Constitutional:      General: He is not in acute distress.    Appearance: Normal appearance. He is well-developed. He is not toxic-appearing.  HENT:     Head: Normocephalic and atraumatic.  Eyes:     General: Lids are normal.     Conjunctiva/sclera: Conjunctivae normal.     Pupils: Pupils are equal, round, and reactive to light.  Neck:     Thyroid: No thyroid mass.     Trachea: No tracheal deviation.  Cardiovascular:     Rate and Rhythm: Normal rate and regular rhythm.     Heart sounds: Normal heart sounds. No murmur heard.    No gallop.  Pulmonary:     Effort: Pulmonary effort is normal. No respiratory distress.     Breath sounds: Normal breath sounds. No stridor. No decreased breath sounds, wheezing, rhonchi or rales.  Abdominal:     General: There is no distension.     Palpations: Abdomen is soft.     Tenderness: There is no abdominal tenderness. There is no rebound.  Musculoskeletal:        General: No  tenderness. Normal range of motion.     Cervical back: Normal range of motion and neck supple.     Comments: Right great toe abrasion.  Neurovascular intact at the right foot.  Full range of motion at the joints.  No deformities noted  Skin:    General: Skin is warm and dry.     Findings: No abrasion or rash.  Neurological:     General: No focal deficit present.     Mental Status: He is alert and oriented to person, place, and time. Mental status is at baseline.     GCS: GCS eye subscore is 4. GCS verbal subscore is 5. GCS motor subscore is 6.     Cranial Nerves: No cranial nerve deficit.     Sensory: No sensory deficit.     Motor: Motor function is intact.  Psychiatric:        Attention and Perception: Attention normal.        Speech: Speech normal.        Behavior: Behavior normal.     ED Results / Procedures / Treatments   Labs (all labs ordered are listed, but only abnormal results are displayed) Labs Reviewed  LACTIC ACID, PLASMA - Abnormal; Notable for the following components:      Result Value   Lactic Acid, Venous 2.2 (*)    All other components within normal limits  COMPREHENSIVE METABOLIC PANEL - Abnormal; Notable for the following components:   Glucose, Bld 101 (*)    BUN 46 (*)    Creatinine, Ser 1.78 (*)    Calcium 8.6 (*)    Total Protein 6.1 (*)    Albumin 3.0 (*)    GFR, Estimated 39 (*)    All other components within normal limits  CBC WITH DIFFERENTIAL/PLATELET - Abnormal; Notable for the following components:   RDW 18.2 (*)    Neutro Abs 7.9 (*)    Abs Immature Granulocytes 0.27 (*)  All other components within normal limits  CULTURE, BLOOD (ROUTINE X 2)  CULTURE, BLOOD (ROUTINE X 2)  URINE CULTURE  RESP PANEL BY RT-PCR (FLU A&B, COVID) ARPGX2  PROTIME-INR  APTT  LACTIC ACID, PLASMA  URINALYSIS, ROUTINE W REFLEX MICROSCOPIC    EKG EKG Interpretation  Date/Time:  Sunday September 05 2022 17:09:59 EST Ventricular Rate:  120 PR Interval:     QRS Duration: 99 QT Interval:  339 QTC Calculation: 467 R Axis:   -72 Text Interpretation: Atrial fibrillation Left anterior fascicular block Low voltage, precordial leads Probable anteroseptal infarct, old Confirmed by Lacretia Leigh ([id number]) on 09/05/2022 7:34:32 PM  Radiology DG Chest Port 1 View  Result Date: 09/05/2022 CLINICAL DATA:  Possible sepsis. EXAM: PORTABLE CHEST 1 VIEW COMPARISON:  07/20/2022 and prior radiographs FINDINGS: RIGHT IJ Port-A-Cath again noted with tip overlying the mid SVC. The cardiomediastinal silhouette is unremarkable. There is no evidence of focal airspace disease, pulmonary edema, suspicious pulmonary nodule/mass, pleural effusion, or pneumothorax. No acute bony abnormalities are identified. IMPRESSION: No active disease. Electronically Signed   By: Margarette Canada M.D.   On: 09/05/2022 18:01   CT Head Wo Contrast  Result Date: 09/05/2022 CLINICAL DATA:  77 year old male with fall and head injury. EXAM: CT HEAD WITHOUT CONTRAST TECHNIQUE: Contiguous axial images were obtained from the base of the skull through the vertex without intravenous contrast. RADIATION DOSE REDUCTION: This exam was performed according to the departmental dose-optimization program which includes automated exposure control, adjustment of the mA and/or kV according to patient size and/or use of iterative reconstruction technique. COMPARISON:  07/20/2022 head CT and prior studies FINDINGS: Brain: No evidence of acute infarction, hemorrhage, hydrocephalus, extra-axial collection or mass lesion/mass effect. Atrophy and chronic small-vessel white matter ischemic changes again noted. Vascular: Carotid and vertebral atherosclerotic calcifications are noted. Skull: Normal. Negative for fracture or focal lesion. Sinuses/Orbits: RIGHT mastoid effusion is noted. The middle ears and inner ears are clear. The paranasal sinuses and LEFT mastoid air cells are clear. Other: None IMPRESSION: 1. No evidence of  acute intracranial abnormality. 2. Atrophy and chronic small-vessel white matter ischemic changes. 3. RIGHT mastoid effusion. Electronically Signed   By: Margarette Canada M.D.   On: 09/05/2022 17:49    Procedures Procedures    Medications Ordered in ED Medications  lactated ringers bolus 1,000 mL (has no administration in time range)  lactated ringers infusion (has no administration in time range)    ED Course/ Medical Decision Making/ A&P                           Medical Decision Making Amount and/or Complexity of Data Reviewed Radiology: ordered.  Risk Prescription drug management.   Patient is EKG per interpretation shows A-fib.  Has history of same.  He is on Xarelto.  Patient's first lactate slightly elevated 2.2.  Repeat was 1.8.  He did receive IV fluids.  COVID and flu test negative.  His electrolytes are significant for acute kidney injury.  Due to the fact that he has been weak and has had 2 falls he will need admission for IV hydration.  Urinalysis is pending at this time.  Patient has been having hematuria.  Patient on renal CT per interpretation showed some diverticulum in the urinary bladder.  Will need to be seen by urology during this admission for bladder cancer workup.  Will admit to the hospital service        Final Clinical Impression(s) /  ED Diagnoses Final diagnoses:  None    Rx / DC Orders ED Discharge Orders     None         Lacretia Leigh, MD 09/05/22 2107

## 2022-09-05 NOTE — ED Provider Triage Note (Signed)
Emergency Medicine Provider Triage Evaluation Note  ASHDEN SONNENBERG , a 76 y.o. male  was evaluated in triage.  Pt complains of felt dizzy, fell, foot injury. Uses a walker, had a UTI 1-2 months ago and had been weak ever since and falls frequently. History of hodgkin's lymphoma with recurrence, last chemo Wednesday.  Reports road rash to right great toe, no other injuries.  Unsure if currently on Xarelto, states is on and off frequently with various treatments.   Review of Systems  Positive: As above Negative: As above  Physical Exam  BP 117/63 (BP Location: Left Arm)   Pulse (!) 117   Temp 97.8 F (36.6 C) (Oral)   Resp 18   Ht '6\' 4"'$  (1.93 m)   Wt 105.2 kg   SpO2 (!) 83%   BMI 28.24 kg/m  Gen:   Awake, no distress   Resp:  Normal effort  MSK:   Moves extremities without difficulty  Other:    Medical Decision Making  Medically screening exam initiated at 4:57 PM.  Appropriate orders placed.  YERACHMIEL SPINNEY was informed that the remainder of the evaluation will be completed by another provider, this initial triage assessment does not replace that evaluation, and the importance of remaining in the ED until their evaluation is complete.     Tacy Learn, PA-C 09/05/22 1902

## 2022-09-05 NOTE — Assessment & Plan Note (Signed)
Continue Lipitor 40 mg p.o. daily 

## 2022-09-05 NOTE — Assessment & Plan Note (Signed)
Currently appears to be dry we will rehydrate monitor fluid status

## 2022-09-05 NOTE — Assessment & Plan Note (Signed)
-  chronic avoid nephrotoxic medications such as NSAIDs, Vanco Zosyn combo,  avoid hypotension, continue to follow renal function  

## 2022-09-05 NOTE — Assessment & Plan Note (Signed)
Resume metoprolol 25 mg p.o. twice daily

## 2022-09-05 NOTE — Assessment & Plan Note (Signed)
-    evidence of acute renal failure due to presence of following: Cr increased >0.3 from baseline  likely secondary to dehydration,        check FeNA       Rehydrate with IV fluids

## 2022-09-05 NOTE — ED Provider Triage Note (Signed)
Emergency Medicine Provider Triage Evaluation Note  [name] , a 76 y.o. male  was evaluated in triage.  Pt complains of felt dizzy, fell, foot injury. Uses a walker, had a UTI 1-2 months ago and had been weak ever since and falls frequently. History of hodgkin's lymphoma with recurrence, last chemo Wednesday. Fall witnessed by pt's husband who assisted back to standing after the fall. Had another fall today. HERE TODAY bc he has been fatigued, sleeping all day and generally weak for the past few days.   Reports road rash to right great toe, no other injuries.   Unsure if currently on Xarelto, states is on and off frequently with various treatments.   Review of Systems  Positive: As above Negative: As above   Physical Exam  BP 117/63 (BP Location: Left Arm)   Pulse (!) 117   Temp 97.8 F (36.6 C) (Oral)   Resp 18   Ht '6\' 4"'$  (1.93 m)   Wt 105.2 kg   SpO2 96%   BMI 28.24 kg/m  Gen:   Awake, no distress   Resp:  Normal effort  MSK:   Moves extremities without difficulty  Other:  Minor abrasion to right great toe, no tenderness, sensation intact.   Medical Decision Making  Medically screening exam initiated at 5:18 PM.  Appropriate orders placed.  MAHESH SIZEMORE was informed that the remainder of the evaluation will be completed by another provider, this initial triage assessment does not replace that evaluation, and the importance of remaining in the ED until their evaluation is complete.     Tacy Learn, PA-C 09/05/22 1718

## 2022-09-05 NOTE — Assessment & Plan Note (Signed)
Continue with Toprol 25 mg p.o. twice daily and Xarelto

## 2022-09-05 NOTE — Subjective & Objective (Signed)
Patient has been feeling dizzy and lightheaded while using his walker resulting in the fall did not strike his head no associated chest pain or shortness of breath.  He is on Xarelto.  Recently have had hematuria and history of recent UTI no fevers no chills though no dysuria

## 2022-09-05 NOTE — Assessment & Plan Note (Signed)
-   Continue Decadron 

## 2022-09-05 NOTE — H&P (Signed)
[name] TKZ:[id number] DOB: September 07, 1946 DOA: 09/05/2022     PCP: Cassandria Anger, MD   Outpatient Specialists:  CARDS:   Dr. Oval Linsey    Oncology  Dr. Raiford Simmonds, Peyton Bottoms, MD Urology Dr. Earmon Phoenix  Patient arrived to ER on 09/05/22 at 1625 Referred by Attending Lacretia Leigh, MD   Patient coming from:    home Lives  With family    Chief Complaint:   Chief Complaint  Patient presents with   Foot Pain   Fall    HPI: [name] is a 76 y.o. male with medical history significant of ESBL, Diffuse large cell lymphoma, hyperlipidemia, hypothyroidism, and anxiety, CAD, A.fib on Xarelto Known nasopharyngeal mass     Presented with   fall Patient has been feeling dizzy and lightheaded while using his walker resulting in the fall did not strike his head no associated chest pain or shortness of breath.  He is on Xarelto.  Recently have had hematuria and history of recent UTI no fevers no chills though no dysuria History of ESBL has been seen by ID in the past felt perhaps it was not a pathogen Pt states he never passed out but gotten wobly and that is why he fell down  Patient reports he has been unstable and falling for the past 2 months last head MRI was 10/12/3  Slight decrease in AP thickness of nasopharyngeal mass,   Reports some bleeding and burning when he urinates it comes and goes  Has not been eating well No diarrhea not vomiting Does not smoke drinks socially    Initial COVID TEST  NEGATIVE   Lab Results  Component Value Date   SARSCOV2NAA NEGATIVE 09/05/2022   [place] NEGATIVE 01/06/2022   SARSCOV2NAA POSITIVE (A) 10/13/2021   [place] NEGATIVE 05/19/2021     Regarding pertinent Chronic problems:    Hyperlipidemia -  on statins Lipitor (atorvastatin) [facility] Lipid Panel     Component Value Date/Time   CHOL 102 10/28/2021 1001   TRIG 64 10/28/2021 1001   HDL 45 10/28/2021 1001   CHOLHDL 2.3 10/28/2021 1001   CHOLHDL 2.1  02/24/2021 1037   VLDL 18 02/24/2021 1037   LDLCALC 43 10/28/2021 1001   LABVLDL 14 10/28/2021 1001    HTN on metoprolol  History of diffuse large B-cell lymphoma on Decadron   chronic CHF diastolic/systolic/ combined - last echo 2022 EF 50-55%    CAD  - On , statin, betablocker,                  -  followed by cardiology                Hypothyroidism:  Lab Results  Component Value Date   TSH 33.136 (H) 07/22/2022   on synthroid    OSA -  noncompliant with CPAP    A. Fib -  - CHA2DS2 vas score  5    current  on anticoagulation with  Xarelto,       -  Rate control:  Currently controlled with  Metoprolol      CKD stage IIIb- baseline Cr 1.4 Estimated Creatinine Clearance: 47 mL/min (A) (by C-G formula based on SCr of 1.78 mg/dL (H)).  Lab Results  Component Value Date   CREATININE 1.78 (H) 09/05/2022   CREATININE 1.21 07/23/2022   CREATININE 1.37 (H) 07/22/2022    Chronic anemia - baseline hg Hemoglobin & Hematocrit  Recent Labs    07/22/22 0217  07/23/22 0418 09/05/22 1755  HGB 12.1* 12.5* 15.1    While in ER:   Noted to have evidence of dehydration and AKI  Ordered  CT HEAD   NON acute right mastoid effusion  CXR -  NON acute  CTabd/pelvis -  non-acute   Following Medications were ordered in ER: Medications  lactated ringers infusion (has no administration in time range)  lactated ringers bolus 1,000 mL (0 mLs Intravenous Stopped 09/05/22 2111)    ____________  ED Triage Vitals  Enc Vitals Group     BP 09/05/22 1635 117/63     Pulse Rate 09/05/22 1635 (!) 117     Resp 09/05/22 1635 18     Temp 09/05/22 1635 97.8 F (36.6 C)     Temp Source 09/05/22 1635 Oral     SpO2 09/05/22 1635 (!) 83 %     Weight 09/05/22 1636 232 lb (105.2 kg)     Height 09/05/22 1636 '6\' 4"'$  (1.93 m)     Head Circumference --      Peak Flow --      Pain Score 09/05/22 1642 0     Pain Loc --      Pain Edu? --      Excl. in [place]? --   TMAX(24)@      _________________________________________ Significant initial  Findings: Abnormal Labs Reviewed  LACTIC ACID, PLASMA - Abnormal; Notable for the following components:      Result Value   Lactic Acid, Venous 2.2 (*)    All other components within normal limits  COMPREHENSIVE METABOLIC PANEL - Abnormal; Notable for the following components:   Glucose, Bld 101 (*)    BUN 46 (*)    Creatinine, Ser 1.78 (*)    Calcium 8.6 (*)    Total Protein 6.1 (*)    Albumin 3.0 (*)    GFR, Estimated 39 (*)    All other components within normal limits  CBC WITH DIFFERENTIAL/PLATELET - Abnormal; Notable for the following components:   RDW 18.2 (*)    Neutro Abs 7.9 (*)    Abs Immature Granulocytes 0.27 (*)    All other components within normal limits  URINALYSIS, ROUTINE W REFLEX MICROSCOPIC - Abnormal; Notable for the following components:   Color, Urine AMBER (*)    APPearance CLOUDY (*)    Hgb urine dipstick MODERATE (*)    Protein, ur >=300 (*)    Leukocytes,Ua LARGE (*)    RBC / HPF >50 (*)    WBC, UA >50 (*)    Bacteria, UA RARE (*)    All other components within normal limits   ______________________ Troponin  ordered ECG: Ordered Personally reviewed and interpreted by me showing: HR : 120 Rhythm:  A.fib. W RVR, Atrial fibrillation Left anterior fascicular block Low voltage, precordial leads Probable anteroseptal infarct, old QTC 467    The recent clinical data is shown below. Vitals:   09/05/22 1920 09/05/22 1945 09/05/22 2000 09/05/22 2030  BP: 122/73 119/68  120/79  Pulse: (!) 30 (!) 34  85  Resp: 20 (!) 21  18  Temp:   97.8 F (36.6 C)   TempSrc:      SpO2: 96% 97%  97%  Weight:      Height:        WBC     Component Value Date/Time   WBC 9.5 09/05/2022 1755   LYMPHSABS 0.9 09/05/2022 1755   LYMPHSABS 1.5 08/09/2019 1100   MONOABS 0.4 09/05/2022 1755  EOSABS 0.0 09/05/2022 1755   EOSABS 0.1 08/09/2019 1100   BASOSABS 0.0 09/05/2022 1755   BASOSABS 0.0  08/09/2019 1100    Lactic Acid, Venous    Component Value Date/Time   LATICACIDVEN 1.8 09/05/2022 1950     Procalcitonin   Ordered      UA increased WBC and RBC in blood   Urine analysis:    Component Value Date/Time   COLORURINE AMBER (A) 09/05/2022 2055   APPEARANCEUR CLOUDY (A) 09/05/2022 2055   LABSPEC 1.013 09/05/2022 2055   PHURINE 8.0 09/05/2022 2055   GLUCOSEU NEGATIVE 09/05/2022 2055   GLUCOSEU NEGATIVE 10/02/2018 1235   HGBUR MODERATE (A) 09/05/2022 2055   BILIRUBINUR NEGATIVE 09/05/2022 2055   KETONESUR NEGATIVE 09/05/2022 2055   PROTEINUR >=300 (A) 09/05/2022 2055   UROBILINOGEN 0.2 10/02/2018 1235   NITRITE NEGATIVE 09/05/2022 2055   LEUKOCYTESUR LARGE (A) 09/05/2022 2055    Results for orders placed or performed during the hospital encounter of 09/05/22  Resp Panel by RT-PCR (Flu A&B, Covid) Anterior Nasal Swab     Status: None   Collection Time: 09/05/22  6:54 PM   Specimen: Anterior Nasal Swab  Result Value Ref Range Status   SARS Coronavirus 2 by RT PCR NEGATIVE NEGATIVE Final         Influenza A by PCR NEGATIVE NEGATIVE Final   Influenza B by PCR NEGATIVE NEGATIVE Final          _______________________________________________ Hospitalist was called for admission for dehydration and AKI resulting in syncope   The following Work up has been ordered so far:  Orders Placed This Encounter  Procedures   Blood Culture (routine x 2)   Urine Culture   Resp Panel by RT-PCR (Flu A&B, Covid) Anterior Nasal Swab   DG Chest Port 1 View   CT Head Wo Contrast   CT Renal Stone Study   Lactic acid, plasma   Comprehensive metabolic panel   CBC with Differential   Protime-INR   APTT   Urinalysis, Routine w reflex microscopic   Diet NPO time specified   Cardiac monitoring   Document height and weight   Assess and Document Glasgow Coma Scale   Document vital signs within 1-hour of fluid bolus completion.  Notify provider of abnormal vital signs despite  fluid resuscitation.   Refer to Sidebar Report: Sepsis Bundle ED/IP   Notify provider for difficulties obtaining IV access   Initiate [place] Fluid Protocol   In and Out Cath   Consult to hospitalist   Pulse oximetry, continuous   ED EKG 12-Lead   Insert peripheral IV X 1     OTHER Significant initial  Findings:  labs showing:  Recent Labs  Lab 09/05/22 1755  NA 138  K 4.6  CO2 23  GLUCOSE 101*  BUN 46*  CREATININE 1.78*  CALCIUM 8.6*    Cr   Up from baseline see below Lab Results  Component Value Date   CREATININE 1.78 (H) 09/05/2022   CREATININE 1.21 07/23/2022   CREATININE 1.37 (H) 07/22/2022    Recent Labs  Lab 09/05/22 1755  AST 25  ALT 30  ALKPHOS 58  BILITOT 1.2  PROT 6.1*  ALBUMIN 3.0*   Lab Results  Component Value Date   CALCIUM 8.6 (L) 09/05/2022   PHOS 2.9 07/23/2022    Plt: Lab Results  Component Value Date   PLT 219 09/05/2022      COVID-19 Labs  No results for input(s): "DDIMER", "FERRITIN", "LDH", "CRP"  in the last 72 hours.  Lab Results  Component Value Date   SARSCOV2NAA NEGATIVE 09/05/2022   SARSCOV2NAA NEGATIVE 01/06/2022   SARSCOV2NAA POSITIVE (A) 10/13/2021   SARSCOV2NAA NEGATIVE 05/19/2021       Recent Labs  Lab 09/05/22 1755  WBC 9.5  NEUTROABS 7.9*  HGB 15.1  HCT 46.8  MCV 94.5  PLT 219    HG/HCT   stable,       Component Value Date/Time   HGB 15.1 09/05/2022 1755   HGB 12.9 (L) 06/30/2022 1519   HCT 46.8 09/05/2022 1755   HCT 37.9 06/30/2022 1519   MCV 94.5 09/05/2022 1755   MCV 97 06/30/2022 1519     Cultures:    Component Value Date/Time   SDES URINE, CLEAN CATCH 07/20/2022 2342   SPECREQUEST  07/20/2022 2342    NONE Performed at [facility], [place] [address], [place], [place] [address]    CULT MULTIPLE SPECIES PRESENT, SUGGEST RECOLLECTION (A) 07/20/2022 2342   REPTSTATUS 07/22/2022 FINAL 07/20/2022 2342     Radiological Exams on Admission: CT Renal Stone Study  Result Date:  09/05/2022 CLINICAL DATA:  History of UTI 1 month ago with persistent weakness. EXAM: CT ABDOMEN AND PELVIS WITHOUT CONTRAST TECHNIQUE: Multidetector CT imaging of the abdomen and pelvis was performed following the standard protocol without IV contrast. RADIATION DOSE REDUCTION: This exam was performed according to the departmental dose-optimization program which includes automated exposure control, adjustment of the mA and/or kV according to patient size and/or use of iterative reconstruction technique. COMPARISON:  July 21, 2022 FINDINGS: Lower chest: Mild linear scarring and/or atelectasis is seen within the right lung base. Hepatobiliary: No focal liver abnormality is seen. No gallstones, gallbladder wall thickening, or biliary dilatation. Pancreas: Unremarkable. No pancreatic ductal dilatation or surrounding inflammatory changes. Spleen: Normal in size without focal abnormality. Adrenals/Urinary Tract: A stable 2.0 cm low-attenuation right adrenal adenoma is noted (approximately 4.44 Hounsfield units). The left adrenal gland is unremarkable. Kidneys are normal in size, without obstructing renal calculi or hydronephrosis. A 3 mm nonobstructing renal calculus is seen within the mid to lower left kidney. A stable 2.2 cm cyst is seen within the posterior aspect of the mid left kidney. The urinary bladder is poorly distended and subsequently limited in evaluation. Bladder is also limited in evaluation secondary to overlying streak artifact. Mild to moderate severity asymmetric lateral urinary bladder wall thickening is seen, right greater than left. A 2.4 cm x 1.3 cm anterior urinary bladder diverticulum is also seen on the right. Stomach/Bowel: Stomach is within normal limits. The appendix is surgically absent. No evidence of bowel wall thickening, distention, or inflammatory changes. Vascular/Lymphatic: Aortic atherosclerosis. No enlarged abdominal or pelvic lymph nodes. Reproductive: Prostate is unremarkable.  Other: A 3.8 cm x 3.5 cm fat containing umbilical hernia is noted. No abdominopelvic ascites. Musculoskeletal: A severe, chronic compression fracture deformity is seen at the level of L1 with stable, moderate severity retropulsion and kyphosis. IMPRESSION: 1. 3 mm nonobstructing left renal calculus. 2. Stable right adrenal adenoma. No follow-up imaging is recommended. This recommendation follows ACR consensus guidelines: Management of Incidental Adrenal Masses: A White Paper of the ACR Incidental Findings Committee. J Am Coll Radiol 2017;14:1038-1044. 3. Stable left renal cyst. No follow-up imaging is recommended. This recommendation follows ACR consensus guidelines: Management of the Incidental Renal Mass on CT: A White Paper of the ACR Incidental Findings Committee. J Am Coll Radiol [phone]. 4. Stable urinary bladder diverticulum with asymmetric, lateral urinary bladder wall  thickening, as described above. While this may represent sequelae associated with cystitis, sequelae associated with an underlying neoplasm cannot be excluded. Urology consult and follow-up cystoscopy is recommended. 5. Stable severe, chronic compression fracture deformity at the level of L1 with stable, moderate severity retropulsion and kyphosis. 6. Total right hip replacement with associated streak artifact. 7. Aortic atherosclerosis. Aortic Atherosclerosis (ICD10-I70.0). Electronically Signed   By: Virgina Norfolk M.D.   On: 09/05/2022 20:36   DG Chest Port 1 View  Result Date: 09/05/2022 CLINICAL DATA:  Possible sepsis. EXAM: PORTABLE CHEST 1 VIEW COMPARISON:  07/20/2022 and prior radiographs FINDINGS: RIGHT IJ Port-A-Cath again noted with tip overlying the mid SVC. The cardiomediastinal silhouette is unremarkable. There is no evidence of focal airspace disease, pulmonary edema, suspicious pulmonary nodule/mass, pleural effusion, or pneumothorax. No acute bony abnormalities are identified. IMPRESSION: No active disease.  Electronically Signed   By: Margarette Canada M.D.   On: 09/05/2022 18:01   CT Head Wo Contrast  Result Date: 09/05/2022 CLINICAL DATA:  76 year old male with fall and head injury. EXAM: CT HEAD WITHOUT CONTRAST TECHNIQUE: Contiguous axial images were obtained from the base of the skull through the vertex without intravenous contrast. RADIATION DOSE REDUCTION: This exam was performed according to the departmental dose-optimization program which includes automated exposure control, adjustment of the mA and/or kV according to patient size and/or use of iterative reconstruction technique. COMPARISON:  07/20/2022 head CT and prior studies FINDINGS: Brain: No evidence of acute infarction, hemorrhage, hydrocephalus, extra-axial collection or mass lesion/mass effect. Atrophy and chronic small-vessel white matter ischemic changes again noted. Vascular: Carotid and vertebral atherosclerotic calcifications are noted. Skull: Normal. Negative for fracture or focal lesion. Sinuses/Orbits: RIGHT mastoid effusion is noted. The middle ears and inner ears are clear. The paranasal sinuses and LEFT mastoid air cells are clear. Other: None IMPRESSION: 1. No evidence of acute intracranial abnormality. 2. Atrophy and chronic small-vessel white matter ischemic changes. 3. RIGHT mastoid effusion. Electronically Signed   By: Margarette Canada M.D.   On: 09/05/2022 17:49   _______________________________________________________________________________________________________ Latest  Blood pressure 120/79, pulse 85, temperature 97.8 F (36.6 C), resp. rate 18, height '6\' 4"'$  (1.93 m), weight 105.2 kg, SpO2 97 %.   Vitals  labs and radiology finding personally reviewed  Review of Systems:    Pertinent positives include:  , fatigue, falsl  Constitutional:  No weight loss, night sweats, Fevers, chills weight loss  HEENT:  No headaches, Difficulty swallowing,Tooth/dental problems,Sore throat,  No sneezing, itching, ear ache, nasal  congestion, post nasal drip,  Cardio-vascular:  No chest pain, Orthopnea, PND, anasarca, dizziness, palpitations.no Bilateral lower extremity swelling  GI:  No heartburn, indigestion, abdominal pain, nausea, vomiting, diarrhea, change in bowel habits, loss of appetite, melena, blood in stool, hematemesis Resp:  no shortness of breath at rest. No dyspnea on exertion, No excess mucus, no productive cough, No non-productive cough, No coughing up of blood.No change in color of mucus.No wheezing. Skin:  no rash or lesions. No jaundice GU:  no dysuria, change in color of urine, no urgency or frequency. No straining to urinate.  No flank pain.  Musculoskeletal:  No joint pain or no joint swelling. No decreased range of motion. No back pain.  Psych:  No change in mood or affect. No depression or anxiety. No memory loss.  Neuro: no localizing neurological complaints, no tingling, no weakness, no double vision, no gait abnormality, no slurred speech, no confusion  All systems reviewed and apart from HOPI all are negative  _______________________________________________________________________________________________ Past Medical History:   Past Medical History:  Diagnosis Date   Benign localized prostatic hyperplasia with lower urinary tract symptoms (LUTS)    CAD (coronary artery disease) cardiologist--  dr t. Oval Linsey   hx of known CAD obstruction w/ collaterals (cath done @ [facility] 12-31-2011) ;   last cardiac cath 08-13-2019  showed sig. 2V CAD involving proxLAD and CTO of the RCA/  chronic total occlusion midRCA w/ bridging and L>R collaterals   Diastolic CHF ([place])    dx [phone] hospital admission (followed by pcp)   Diffuse large B cell lymphoma [facility]) oncologist-- dr Irene Limbo--- in remission   dx 08/ 2019 -- bx left tonsill mass-- involving lymph nodes--- completed chemotherapy 10-12-2018   DJD (degenerative joint disease)    Dysthymic disorder    Environmental allergies    GAD (generalized  anxiety disorder)    with dysthymia   GERD (gastroesophageal reflux disease)    History of falling    multiple times   History of kidney stones    HLD (hyperlipidemia)    Hyperlipidemia    Hypertension    Hypogonadism in male    OA (osteoarthritis)    OSA on CPAP    cpap machine-settings 17   Osteoporosis with fracture 04/24/2013   Persistent atrial fibrillation ([place])    On Xarelto, cardiologist--- dr Alfonso Patten. [place]   Plaque psoriasis    dermatologist--- dr Harriett Sine--- currently taking otezla   Post-surgical hypothyroidism    followed by pcp---  s/p total thyroidectomy for graves disease in 1987   Wears hearing aid in both ears     Past Surgical History:  Procedure Laterality Date   APPENDECTOMY  2011 approx.    CARDIAC CATHETERIZATION  12-31-2011   dr j. Beatrix Fetters '@[facility]'$    normal LVF w/ multivessel CAD,  occluded RCA w/ colleterals   CATARACT EXTRACTION W/ INTRAOCULAR LENS  IMPLANT, BILATERAL  2016 approx.   COLONOSCOPY     CYSTOSCOPY WITH RETROGRADE PYELOGRAM, URETEROSCOPY AND STENT PLACEMENT Left 11/07/2017   Procedure: CYSTOSCOPY WITH RETROGRADE PYELOGRAM, URETEROSCOPY AND STENT PLACEMENT;  Surgeon: Cleon Gustin, MD;  Location: [facility];  Service: Urology;  Laterality: Left;   CYSTOSCOPY WITH RETROGRADE PYELOGRAM, URETEROSCOPY AND STENT PLACEMENT Right 10/22/2019   Procedure: CYSTOSCOPY WITH RETROGRADE PYELOGRAM, URETEROSCOPY AND STENT PLACEMENT;  Surgeon: Cleon Gustin, MD;  Location: [facility];  Service: Urology;  Laterality: Right;   CYSTOSCOPY/URETEROSCOPY/HOLMIUM LASER/STENT PLACEMENT Right 03/11/2017   Procedure: CYSTOSCOPY/URETEROSCOPYSTENT PLACEMENT right ureter retrograde pylegram;  Surgeon: Cleon Gustin, MD;  Location: [facility];  Service: Urology;  Laterality: Right;   HARDWARE REMOVAL  10/05/2012   Procedure: HARDWARE REMOVAL;  Surgeon: [name], MD;  Location: [facility];  Service: Orthopedics;  Laterality: Right;  REMOVING  STRYKER   GAMMA NAIL   HARDWARE REMOVAL Right 07/03/2013   Procedure: HARDWARE REMOVAL RIGHT TIBIA ;  Surgeon: Rozanna Box, MD;  Location: [place];  Service: Orthopedics;  Laterality: Right;   HIP CLOSED REDUCTION Right 10/15/2013   Procedure: CLOSED MANIPULATION HIP;  Surgeon: [name], MD;  Location: [facility];  Service: Orthopedics;  Laterality: Right;   HOLMIUM LASER APPLICATION Right 02/15/3093   Procedure: HOLMIUM LASER APPLICATION;  Surgeon: Cleon Gustin, MD;  Location: [facility];  Service: Urology;  Laterality: Right;   HOLMIUM LASER APPLICATION Left 0/76/8088   Procedure: HOLMIUM LASER APPLICATION;  Surgeon: Cleon Gustin, MD;  Location: [facility];  Service: Urology;  Laterality: Left;   HOLMIUM  LASER APPLICATION Left 04/13/2594   Procedure: HOLMIUM LASER APPLICATION;  Surgeon: Cleon Gustin, MD;  [facility];  Service: Urology;  Laterality: Left;   HOLMIUM LASER APPLICATION Right 6/38/7564   Procedure: HOLMIUM LASER APPLICATION;  Surgeon: Cleon Gustin, MD;  Location[facility];  Service: Urology;  Laterality: Right;   INCISION AND DRAINAGE HIP Right 11/16/2013   Procedure: IRRIGATION AND DEBRIDEMENT RIGHT HIP;  Surgeon: [name], MD;  [facility];  Service: Orthopedics;  Laterality: Right;   INTRAVASCULAR PRESSURE WIRE/FFR STUDY N/A 08/13/2019   Procedure: INTRAVASCULAR PRESSURE WIRE/FFR STUDY;  Surgeon: Nelva Bush, MD;  Location: [place] [facility];  Service: Cardiovascular;  Laterality: N/A;   IR IMAGING GUIDED PORT INSERTION  06/22/2018   IR NEPHROSTOMY PLACEMENT LEFT  03/28/2019   IR URETERAL STENT LEFT NEW ACCESS W/O SEP NEPHROSTOMY CATH  10/24/2017   IR URETERAL STENT LEFT NEW ACCESS W/O SEP NEPHROSTOMY CATH  03/26/2019   NEPHROLITHOTOMY Right 02/08/2017   Procedure: NEPHROLITHOTOMY PERCUTANEOUS WITH SURGEON ACCESS;  Surgeon: Cleon Gustin, MD;  [facility];  Service: Urology;  Laterality: Right;   NEPHROLITHOTOMY Left 10/24/2017    Procedure: NEPHROLITHOTOMY PERCUTANEOUS;  Surgeon: Cleon Gustin, MD;  [facility];  Service: Urology;  Laterality: Left;   NEPHROLITHOTOMY Left 03/26/2019   Procedure: NEPHROLITHOTOMY PERCUTANEOUS;  Surgeon: Cleon Gustin, MD;  [facility];  Service: Urology;  Laterality: Left;  2 HRS   NEPHROLITHOTOMY Left 04/17/2019   Procedure: NEPHROLITHOTOMY PERCUTANEOUS;  Surgeon: Cleon Gustin, MD;  [facility];  Service: Urology;  Laterality: Left;  2 HRS   ORIF TIBIA FRACTURE Right 02/06/2013   Procedure: OPEN REDUCTION INTERNAL FIXATION (ORIF) TIBIA FRACTURE WITH IM ROD FIBULA;  Surgeon: Rozanna Box, MD;  Location: [place];  Service: Orthopedics;  Laterality: Right;   ORIF TIBIA FRACTURE Right 07/03/2013   Procedure: RIGHT TIBIA NON UNION REPAIR ;  Surgeon: Rozanna Box, MD;  Location: [place];  Service: Orthopedics;  Laterality: Right;   ORIF WRIST FRACTURE  10/02/2012   Procedure: OPEN REDUCTION INTERNAL FIXATION (ORIF) WRIST FRACTURE;  Surgeon: Roseanne Kaufman, MD;  [facility];  Service: Orthopedics;  Laterality: Right;  WITH   ANTIBIOTIC  CEMENT   ORIF WRIST FRACTURE Left 10/28/2013   Procedure: OPEN REDUCTION INTERNAL FIXATION (ORIF) WRIST FRACTURE with allograft;  Surgeon: Roseanne Kaufman, MD;  [facility];  Service: Orthopedics;  Laterality: Left;  DVR Plate   QUADRICEPS TENDON REPAIR Left 07/15/2017   Procedure: REPAIR QUADRICEP TENDON;  Surgeon: Frederik Pear, MD;  Location: [place];  Service: Orthopedics;  Laterality: Left;   RIGHT/LEFT HEART CATH AND CORONARY ANGIOGRAPHY N/A 08/13/2019   Procedure: RIGHT/LEFT HEART CATH AND CORONARY ANGIOGRAPHY;  Surgeon: Nelva Bush, MD;  Location: [place] [facility];  Service: Cardiovascular;  Laterality: N/A;   THYROIDECTOMY  02/1986   TOTAL HIP ARTHROPLASTY Right 03-16-2016   '@[facility]'$    TOTAL HIP REVISION  10/05/2012   Procedure: TOTAL HIP REVISION;  Surgeon: [name], MD;  [facility];  Service:  Orthopedics;  Laterality: Right;  RIGHT TOTAL HIP REVISION   TOTAL HIP REVISION Right 09/17/2013   Procedure: REVISION RIGHT TOTAL HIP ARTHROPLASTY ;  Surgeon: [name], MD;  [facility];  Service: Orthopedics;  Laterality: Right;   TOTAL HIP REVISION Right 10/26/2013   Procedure: REVISION RIGHT TOTAL HIP ARTHROPLASTY;  Surgeon: [name], MD;  [facility];  Service: Orthopedics;  Laterality: Right;   TOTAL KNEE ARTHROPLASTY Bilateral right 03-15-2011;  left 06-30-2011   TOTAL KNEE REVISION Left 04/11/2017   Procedure: TOTAL KNEE REVISION PATELLA and TIBIA;  Surgeon: Frederik Pear, MD;  Location: [place];  Service: Orthopedics;  Laterality: Left;    Social History:  Ambulatory   walker        reports that he quit smoking about 10 years ago. His smoking use included cigarettes. He has a 50.00 pack-year smoking history. He has never used smokeless tobacco. He reports current alcohol use. He reports that he does not use drugs.   Family History:   Family History  Problem Relation Age of Onset   CAD Father 62   Asthma Father    Alcohol abuse Father    Arthritis Mother    Alcohol abuse Sister    ______________________________________________________________________________________________ Allergies: Allergies  Allergen Reactions   Short Ragweed Pollen Ext Cough     Prior to Admission medications   Medication Sig Start Date End Date Taking? Authorizing Provider  acetaminophen (TYLENOL) 325 MG tablet Take 2 tablets (650 mg total) by mouth every 6 (six) hours as needed for mild pain (or Fever >/= 101). 03/25/22   Raiford Noble Latif, DO  albuterol (VENTOLIN HFA) 108 (90 Base) MCG/ACT inhaler Inhale 2 puffs into the lungs every 6 (six) hours as needed for wheezing or shortness of breath.    [provider]  alfuzosin (UROXATRAL) 10 MG 24 hr tablet TAKE 1 TABLET AT BEDTIME 08/10/22   McKenzie, Candee Furbish, MD  allopurinol (ZYLOPRIM) 100 MG tablet Take 1 tablet (100 mg  total) by mouth 2 (two) times daily. 06/30/22   Truman Hayward, MD  atorvastatin (LIPITOR) 80 MG tablet TAKE 1 TABLET EVERY DAY 08/09/22   Loel Dubonnet, NP  buPROPion (WELLBUTRIN XL) 300 MG 24 hr tablet TAKE 1 TABLET EVERY DAY WITH BREAKFAST Patient taking differently: Take 300 mg by mouth daily. 07/19/22   Plotnikov, Evie Lacks, MD  Calcium Citrate-Vitamin D (CALCIUM CITRATE + D PO) Take 1 tablet by mouth 2 (two) times daily. '1200mg'$  of Calcium and 1000 units vitamin D3    [provider]  Cholecalciferol (VITAMIN D) 50 MCG (2000 UT) tablet Take 4,000 Units by mouth daily.    [provider]  clonazePAM (KLONOPIN) 1 MG tablet Take 1 tablet (1 mg total) by mouth 3 (three) times daily as needed for anxiety. 08/23/22   Plotnikov, Evie Lacks, MD  CRANBERRY PO Take 2 tablets by mouth every morning.    [provider]  denosumab (PROLIA) 60 MG/ML SOSY injection Inject 60 mg as directed every 6 (six) months. 12/10/15   [provider]  dexamethasone (DECADRON) 4 MG tablet Take 4 mg by mouth in the morning, at noon, in the evening, and at bedtime. 08/11/22   [provider]  DULoxetine (CYMBALTA) 60 MG capsule TAKE 1 CAPSULE TWICE DAILY Patient taking differently: Take 60 mg by mouth 2 (two) times daily. 07/19/22   Plotnikov, Evie Lacks, MD  EPINEPHrine 0.3 mg/0.3 mL IJ SOAJ injection Inject 0.3 mg into the muscle once as needed for anaphylaxis (severe allergic reaction). 07/31/21   [provider]  ezetimibe (ZETIA) 10 MG tablet Take 10 mg by mouth at bedtime.    [provider]  feeding supplement (ENSURE ENLIVE / ENSURE PLUS) LIQD Take 237 mLs by mouth 2 (two) times daily between meals. Patient taking differently: Take 237 mLs by mouth daily. 03/25/22   Kerney Elbe,  DO  ferrous sulfate 325 (65 FE) MG tablet Take 325 mg by mouth 2 (two) times daily with a meal.    [provider]  HYDROcodone-acetaminophen (NORCO) 7.5-325 MG  tablet Take 1 tablet by mouth every 6 (six) hours as needed for severe pain. 03/26/22   Raiford Noble Latif, DO  levothyroxine (SYNTHROID) 125 MCG tablet Take 1 tablet (125 mcg total) by mouth daily at 6 (six) AM. 08/23/22   Plotnikov, Evie Lacks, MD  lidocaine-prilocaine (EMLA) cream Apply 1 Application topically as needed. Patient taking differently: Apply 1 Application topically as needed (For port access/chemotherapy). 04/21/22   Brunetta Genera, MD  loratadine (CLARITIN) 10 MG tablet Take 10 mg by mouth daily.    [provider]  Magnesium 500 MG CAPS Take 1 capsule (500 mg total) by mouth daily. Patient taking differently: Take 500 mg by mouth daily with supper. 10/13/18   Brunetta Genera, MD  Menthol, Topical Analgesic, (BIOFREEZE) 10 % CREA Apply 1 Application topically daily as needed (Knee pain).    [provider]  metoprolol tartrate (LOPRESSOR) 25 MG tablet Take 25 mg by mouth 2 (two) times daily.    [provider]  metroNIDAZOLE (METROGEL) 1 % gel Apply 1 Application topically daily. Patient not taking: Reported on 08/31/2022 08/02/22   [provider]  Multiple Vitamin (MULTIVITAMIN WITH MINERALS) TABS tablet Take 1 tablet by mouth every morning. Men's One-A-Day 50+    [provider]  MYRBETRIQ 25 MG TB24 tablet TAKE 1 TABLET EVERY DAY Patient taking differently: Take 25 mg by mouth at bedtime. 10/06/21   McKenzie, Candee Furbish, MD  nitroGLYCERIN (NITROSTAT) 0.4 MG SL tablet Place 0.4 mg under the tongue every 5 (five) minutes as needed for chest pain.    [provider]  omeprazole (PRILOSEC) 40 MG capsule TAKE 1 CAPSULE EVERY DAY BEFORE BREAKFAST Patient taking differently: Take 40 mg by mouth daily. 07/19/22   Plotnikov, Evie Lacks, MD  Potassium Citrate 15 MEQ (1620 MG) TBCR Take 1 tablet by mouth See admin instructions. Take one tablet (15 meq) by mouth twice daily - with lunch and supper Patient taking differently: Take 1  tablet by mouth in the morning and at bedtime. 06/10/22   McKenzie, Candee Furbish, MD  Probiotic Product (PROBIOTIC PO) Take 1 capsule by mouth daily with breakfast.    [provider]  Risankizumab-rzaa (SKYRIZI, 150 MG DOSE, [place]) Inject 150 mg into the skin every 4 (four) months.    [provider]  rivaroxaban (XARELTO) 20 MG TABS tablet Take 1 tablet (20 mg total) by mouth daily with supper. 05/26/22   Loel Dubonnet, NP  solifenacin (VESICARE) 10 MG tablet Take 10 mg by mouth daily.    [provider]  testosterone enanthate (DELATESTRYL) 200 MG/ML injection INJECT 1ML ('200MG'$ ) INTO THE MUSCLE EVERY 14 DAYS. DISCARD VIAL AFTER 28 DAYS. Patient taking differently: Inject 200 mg into the muscle every 14 (fourteen) days. Every other Friday 09/22/21   Plotnikov, Evie Lacks, MD  tiZANidine (ZANAFLEX) 4 MG tablet Take 4 mg by mouth daily as needed for muscle spasms.    [provider]  vitamin C (ASCORBIC ACID) 500 MG tablet Take 500 mg by mouth daily with lunch.    [provider]    ___________________________________________________________________________________________________ Physical Exam:    09/05/2022    8:30 PM 09/05/2022    7:45 PM 09/05/2022    7:20 PM  Vitals with BMI  Systolic [phone]  Diastolic 79 68 73  Pulse 85 34 30    1. General:  in No  Acute distress   Chronically ill   -appearing 2. Psychological: Alert and   Oriented 3. Head/ENT Dry Mucous Membranes                          Head Non traumatic, neck supple                           Poor Dentition 4. SKIN:  decreased Skin turgor,  Skin clean Dry and intact abrasions on the feet bilaterally 5. Heart: Regular rate and rhythm no  Murmur, no Rub or gallop 6. Lungs: no wheezes or crackles   7. Abdomen: Soft,  non-tender, Non distended   8. Lower extremities: no clubbing, cyanosis, no  edema 9. Neurologically Grossly intact, moving all 4 extremities equally   10. MSK:  Normal range of motion    Chart has been reviewed  ______________________________________________________________________________________________  Assessment/Plan 76 y.o. male with medical history significant of ESBL, Diffuse large cell lymphoma, hyperlipidemia, hypothyroidism, and anxiety, CAD, A.fib on Xarelto  Admitted for AKI dehydration syncope   Present on Admission:  Syncope  AKI (acute kidney injury) ([place])  Hypothyroidism  Essential hypertension  Chronic diastolic heart failure (HCC)  Paroxysmal atrial fibrillation (HCC)  Chronic obstructive pulmonary disease (HCC)  Diffuse large B-cell lymphoma (HCC)  Stage 3a chronic kidney disease (CKD) (HCC) - Baseline Scr 1.5 - 1.6  History of recurrent UTIs  Normocytic anemia  CAD (coronary artery disease)     AKI (acute kidney injury) ([place]) -  evidence of acute renal failure due to presence of following: Cr increased >0.3 from baseline  likely secondary to dehydration,        check FeNA       Rehydrate with IV fluids       Hypothyroidism Check TSH and continue synthroid 125 mcg po daily  Dose has been changed recently will need to clarify in AM  Essential hypertension Resume metoprolol 25 mg p.o. twice daily  Chronic diastolic heart failure (HCC) Currently appears to be dry we will rehydrate monitor fluid status  Paroxysmal atrial fibrillation (HCC) Continue with Toprol 25 mg p.o. twice daily and Xarelto  Chronic obstructive pulmonary disease (HCC) Chronic and stable  Diffuse large B-cell lymphoma (HCC) Continue Decadron  Stage 3a chronic kidney disease (CKD) (HCC) - Baseline Scr 1.5 - 1.6  -chronic avoid nephrotoxic medications such as NSAIDs, Vanco Zosyn combo,  avoid hypotension, continue to follow renal function   History of recurrent UTIs Abnormal UA with hematuria.  Up to await results of urine culture.  Patient at this point afebrile Request ID consult to see in a.m. to further evaluate need for    antibiotics History of ESBL in the past  Normocytic anemia Chronic stable continue to monitor  CAD (coronary artery disease) Continue Lipitor 40 mg p.o. daily  Generalized weakness Patient with frequent falls will have PT OT evaluation   Other plan as per orders.  DVT prophylaxis:  on xarelto   Code Status:  DNR/DNI  as per patient   I had personally discussed CODE STATUS with patient      Family Communication:   Family not at  Bedside    Disposition Plan:     likely will need placement for rehabilitation  Following barriers for discharge:                                                        Will need to be able to tolerate PO                         Will need consultants to evaluate patient prior to discharge                      Would benefit from PT/OT eval prior to DC  Ordered                   Swallow eval - SLP ordered                                      Transition of care consulted                   Nutrition    consulted                                     Palliative care    consulted              Consults called: will let ID know pt has been admitted  Admission status:  ED Disposition     ED Disposition  Benjamin Perez: [place] [[id number]]  Level of Care: Telemetry [5]  Admit to tele based on following criteria: Other see comments  Comments: syncope  May place patient in observation at [facility] or [place] if equivalent level of care is available:: No  Covid Evaluation: Asymptomatic - no recent exposure (last 10 days) testing not required  Diagnosis: Syncope [[id number]]  Admitting Physician: Toy Baker [[id number]]  Attending Physician: Toy Baker [[id number]]           Obs    Level of care     tele  For   24H     Lab Results  Component Value Date   [place] 09/05/2022     Precautions: admitted as   Covid Negative  Jisele Price 09/05/2022, 10:33 PM    [facility]     after 2 AM please page floor coverage PA If 7AM-7PM, please contact the day team taking care of the patient using [email or web address]   Patient was evaluated in the context of the global COVID-19 pandemic, which necessitated consideration that the patient might be at risk for infection with the SARS-CoV-2 virus that causes COVID-19. Institutional protocols and algorithms that pertain to the evaluation of patients at risk for COVID-19 are in a state of rapid change based on information released by regulatory bodies including the CDC and federal and state organizations. These policies and algorithms were followed during the patient's care.

## 2022-09-05 NOTE — Assessment & Plan Note (Signed)
Chronic and stable.   

## 2022-09-05 NOTE — Assessment & Plan Note (Signed)
Abnormal UA with hematuria.  Up to await results of urine culture.  Patient at this point afebrile Request ID consult to see in a.m. to further evaluate need for   antibiotics History of ESBL in the past

## 2022-09-05 NOTE — Assessment & Plan Note (Signed)
Patient with frequent falls will have PT OT evaluation

## 2022-09-06 ENCOUNTER — Observation Stay (HOSPITAL_BASED_OUTPATIENT_CLINIC_OR_DEPARTMENT_OTHER): Payer: Medicare PPO

## 2022-09-06 ENCOUNTER — Encounter (HOSPITAL_COMMUNITY): Payer: Self-pay | Admitting: Internal Medicine

## 2022-09-06 ENCOUNTER — Encounter: Payer: Self-pay | Admitting: Infectious Disease

## 2022-09-06 DIAGNOSIS — R55 Syncope and collapse: Secondary | ICD-10-CM

## 2022-09-06 LAB — ECHOCARDIOGRAM COMPLETE
Calc EF: 55.7 %
Height: 76 in
S' Lateral: 3.6 cm
Single Plane A2C EF: 59 %
Single Plane A4C EF: 55.6 %
Weight: 3712.02 oz

## 2022-09-06 LAB — TSH: TSH: 14.031 u[IU]/mL — ABNORMAL HIGH (ref 0.350–4.500)

## 2022-09-06 LAB — CBC
HCT: 42.6 % (ref 39.0–52.0)
Hemoglobin: 13.3 g/dL (ref 13.0–17.0)
MCH: 30.2 pg (ref 26.0–34.0)
MCHC: 31.2 g/dL (ref 30.0–36.0)
MCV: 96.6 fL (ref 80.0–100.0)
Platelets: 148 10*3/uL — ABNORMAL LOW (ref 150–400)
RBC: 4.41 MIL/uL (ref 4.22–5.81)
RDW: 18.2 % — ABNORMAL HIGH (ref 11.5–15.5)
WBC: 7 10*3/uL (ref 4.0–10.5)
nRBC: 0 % (ref 0.0–0.2)

## 2022-09-06 LAB — COMPREHENSIVE METABOLIC PANEL
ALT: 25 U/L (ref 0–44)
AST: 23 U/L (ref 15–41)
Albumin: 2.3 g/dL — ABNORMAL LOW (ref 3.5–5.0)
Alkaline Phosphatase: 45 U/L (ref 38–126)
Anion gap: 8 (ref 5–15)
BUN: 39 mg/dL — ABNORMAL HIGH (ref 8–23)
CO2: 23 mmol/L (ref 22–32)
Calcium: 8 mg/dL — ABNORMAL LOW (ref 8.9–10.3)
Chloride: 106 mmol/L (ref 98–111)
Creatinine, Ser: 1.52 mg/dL — ABNORMAL HIGH (ref 0.61–1.24)
GFR, Estimated: 47 mL/min — ABNORMAL LOW (ref >60)
Glucose, Bld: 66 mg/dL — ABNORMAL LOW (ref 70–99)
Potassium: 3.8 mmol/L (ref 3.5–5.1)
Sodium: 137 mmol/L (ref 135–145)
Total Bilirubin: 1.1 mg/dL (ref 0.3–1.2)
Total Protein: 4.9 g/dL — ABNORMAL LOW (ref 6.5–8.1)

## 2022-09-06 LAB — MAGNESIUM: Magnesium: 1.8 mg/dL (ref 1.7–2.4)

## 2022-09-06 LAB — PROCALCITONIN: Procalcitonin: <0.1 ng/mL

## 2022-09-06 LAB — TROPONIN I (HIGH SENSITIVITY)
Troponin I (High Sensitivity): 6 ng/L (ref <18)
Troponin I (High Sensitivity): 7 ng/L (ref <18)

## 2022-09-06 LAB — PREALBUMIN: Prealbumin: 22 mg/dL (ref 18–38)

## 2022-09-06 LAB — GLUCOSE, CAPILLARY: Glucose-Capillary: 136 mg/dL — ABNORMAL HIGH (ref 70–99)

## 2022-09-06 LAB — PHOSPHORUS: Phosphorus: 2.7 mg/dL (ref 2.5–4.6)

## 2022-09-06 LAB — CK: Total CK: 72 U/L (ref 49–397)

## 2022-09-06 MED ORDER — ATORVASTATIN CALCIUM 40 MG PO TABS
80.0000 mg | ORAL_TABLET | Freq: Every day | ORAL | Status: DC
  Administered 2022-09-06 – 2022-09-07 (×2): 80 mg via ORAL
  Filled 2022-09-06 (×2): qty 2

## 2022-09-06 MED ORDER — LEVOTHYROXINE SODIUM 50 MCG PO TABS
150.0000 ug | ORAL_TABLET | Freq: Every day | ORAL | Status: DC
  Administered 2022-09-07: 150 ug via ORAL
  Filled 2022-09-06: qty 1

## 2022-09-06 MED ORDER — PANTOPRAZOLE SODIUM 40 MG PO TBEC
40.0000 mg | DELAYED_RELEASE_TABLET | Freq: Every day | ORAL | Status: DC
  Administered 2022-09-06 – 2022-09-07 (×2): 40 mg via ORAL
  Filled 2022-09-06 (×2): qty 1

## 2022-09-06 MED ORDER — BUPROPION HCL ER (XL) 300 MG PO TB24
300.0000 mg | ORAL_TABLET | Freq: Every day | ORAL | Status: DC
  Administered 2022-09-06 – 2022-09-07 (×2): 300 mg via ORAL
  Filled 2022-09-06 (×2): qty 1

## 2022-09-06 MED ORDER — LEVOTHYROXINE SODIUM 25 MCG PO TABS
125.0000 ug | ORAL_TABLET | Freq: Every day | ORAL | Status: DC
  Administered 2022-09-06: 125 ug via ORAL
  Filled 2022-09-06: qty 1

## 2022-09-06 MED ORDER — ACETAMINOPHEN 650 MG RE SUPP: 650.0000 mg | Freq: Four times a day (QID) | RECTAL | Status: DC | PRN

## 2022-09-06 MED ORDER — RIVAROXABAN 20 MG PO TABS
20.0000 mg | ORAL_TABLET | Freq: Every day | ORAL | Status: DC
  Administered 2022-09-06 – 2022-09-07 (×2): 20 mg via ORAL
  Filled 2022-09-06 (×2): qty 1

## 2022-09-06 MED ORDER — ACETAMINOPHEN 325 MG PO TABS: 650.0000 mg | ORAL_TABLET | Freq: Four times a day (QID) | ORAL | Status: DC | PRN

## 2022-09-06 MED ORDER — SODIUM CHLORIDE 0.9 % IV SOLN: INTRAVENOUS | Status: AC

## 2022-09-06 MED ORDER — ALBUTEROL SULFATE (2.5 MG/3ML) 0.083% IN NEBU: 2.5000 mg | INHALATION_SOLUTION | Freq: Four times a day (QID) | RESPIRATORY_TRACT | Status: DC | PRN

## 2022-09-06 MED ORDER — ZINC OXIDE 12.8 % EX OINT
TOPICAL_OINTMENT | Freq: Every day | CUTANEOUS | Status: DC
  Filled 2022-09-06: qty 56.7

## 2022-09-06 MED ORDER — ENSURE ENLIVE PO LIQD
237.0000 mL | Freq: Two times a day (BID) | ORAL | Status: DC
  Administered 2022-09-06 – 2022-09-07 (×3): 237 mL via ORAL

## 2022-09-06 MED ORDER — CLONAZEPAM 1 MG PO TABS
1.0000 mg | ORAL_TABLET | Freq: Three times a day (TID) | ORAL | Status: DC | PRN
  Administered 2022-09-06: 1 mg via ORAL
  Filled 2022-09-06: qty 1

## 2022-09-06 MED ORDER — ADULT MULTIVITAMIN W/MINERALS CH
1.0000 | ORAL_TABLET | Freq: Every day | ORAL | Status: DC
  Administered 2022-09-06 – 2022-09-07 (×2): 1 via ORAL
  Filled 2022-09-06 (×2): qty 1

## 2022-09-06 MED ORDER — MIRABEGRON ER 25 MG PO TB24
25.0000 mg | ORAL_TABLET | Freq: Every day | ORAL | Status: DC
  Administered 2022-09-06 (×2): 25 mg via ORAL
  Filled 2022-09-06 (×3): qty 1

## 2022-09-06 MED ORDER — METOPROLOL TARTRATE 25 MG PO TABS
25.0000 mg | ORAL_TABLET | Freq: Two times a day (BID) | ORAL | Status: DC
  Administered 2022-09-06 – 2022-09-07 (×4): 25 mg via ORAL
  Filled 2022-09-06 (×4): qty 1

## 2022-09-06 MED ORDER — DULOXETINE HCL 60 MG PO CPEP
60.0000 mg | ORAL_CAPSULE | Freq: Two times a day (BID) | ORAL | Status: DC
  Administered 2022-09-06 – 2022-09-07 (×4): 60 mg via ORAL
  Filled 2022-09-06 (×4): qty 1

## 2022-09-06 MED ORDER — ALFUZOSIN HCL ER 10 MG PO TB24
10.0000 mg | ORAL_TABLET | Freq: Every day | ORAL | Status: DC
  Administered 2022-09-06 (×2): 10 mg via ORAL
  Filled 2022-09-06 (×3): qty 1

## 2022-09-06 MED ORDER — ALBUTEROL SULFATE HFA 108 (90 BASE) MCG/ACT IN AERS: 2.0000 | INHALATION_SPRAY | Freq: Four times a day (QID) | RESPIRATORY_TRACT | Status: DC | PRN

## 2022-09-06 MED ORDER — DEXAMETHASONE 4 MG PO TABS
4.0000 mg | ORAL_TABLET | Freq: Four times a day (QID) | ORAL | Status: DC
  Administered 2022-09-06 – 2022-09-07 (×7): 4 mg via ORAL
  Filled 2022-09-06 (×7): qty 1

## 2022-09-06 NOTE — Evaluation (Signed)
Clinical/Bedside Swallow Evaluation Patient Details  Name: EJAY LASHLEY MRN: [id number] Date of Birth: 01-Sep-1946  Today's Date: 09/06/2022 Time: SLP Start Time (ACUTE ONLY): 1140 SLP Stop Time (ACUTE ONLY): 1155 SLP Time Calculation (min) (ACUTE ONLY): 15 min  Past Medical History:  Past Medical History:  Diagnosis Date   Benign localized prostatic hyperplasia with lower urinary tract symptoms (LUTS)    CAD (coronary artery disease) cardiologist--  dr t. Oval Linsey   hx of known CAD obstruction w/ collaterals (cath done @ [facility] 12-31-2011) ;   last cardiac cath 08-13-2019  showed sig. 2V CAD involving proxLAD and CTO of the RCA/  chronic total occlusion midRCA w/ bridging and L>R collaterals   Diastolic CHF ([place])    dx [phone] hospital admission (followed by pcp)   Diffuse large B cell lymphoma [facility]) oncologist-- dr Irene Limbo--- in remission   dx 08/ 2019 -- bx left tonsill mass-- involving lymph nodes--- completed chemotherapy 10-12-2018   DJD (degenerative joint disease)    Dysthymic disorder    Environmental allergies    GAD (generalized anxiety disorder)    with dysthymia   GERD (gastroesophageal reflux disease)    History of falling    multiple times   History of kidney stones    HLD (hyperlipidemia)    Hyperlipidemia    Hypertension    Hypogonadism in male    OA (osteoarthritis)    OSA on CPAP    cpap machine-settings 17   Osteoporosis with fracture 04/24/2013   Persistent atrial fibrillation (HCC)    On Xarelto, cardiologist--- dr Alfonso Patten. [place]   Plaque psoriasis    dermatologist--- dr Harriett Sine--- currently taking otezla   Post-surgical hypothyroidism    followed by pcp---  s/p total thyroidectomy for graves disease in 1987   Wears hearing aid in both ears    Past Surgical History:  Past Surgical History:  Procedure Laterality Date   APPENDECTOMY  2011 approx.    CARDIAC CATHETERIZATION  12-31-2011   dr j. Beatrix Fetters '@[facility]'$    normal LVF w/ multivessel CAD,   occluded RCA w/ colleterals   CATARACT EXTRACTION W/ INTRAOCULAR LENS  IMPLANT, BILATERAL  2016 approx.   COLONOSCOPY     CYSTOSCOPY WITH RETROGRADE PYELOGRAM, URETEROSCOPY AND STENT PLACEMENT Left 11/07/2017   Procedure: CYSTOSCOPY WITH RETROGRADE PYELOGRAM, URETEROSCOPY AND STENT PLACEMENT;  Surgeon: Cleon Gustin, MD;  Location: [facility];  Service: Urology;  Laterality: Left;   CYSTOSCOPY WITH RETROGRADE PYELOGRAM, URETEROSCOPY AND STENT PLACEMENT Right 10/22/2019   Procedure: CYSTOSCOPY WITH RETROGRADE PYELOGRAM, URETEROSCOPY AND STENT PLACEMENT;  Surgeon: Cleon Gustin, MD;  Location: [facility];  Service: Urology;  Laterality: Right;   CYSTOSCOPY/URETEROSCOPY/HOLMIUM LASER/STENT PLACEMENT Right 03/11/2017   Procedure: CYSTOSCOPY/URETEROSCOPYSTENT PLACEMENT right ureter retrograde pylegram;  Surgeon: Cleon Gustin, MD;  [facility];  Service: Urology;  Laterality: Right;   HARDWARE REMOVAL  10/05/2012   Procedure: HARDWARE REMOVAL;  Surgeon: [name], MD;  [facility];  Service: Orthopedics;  Laterality: Right;  REMOVING  STRYKER  GAMMA NAIL   HARDWARE REMOVAL Right 07/03/2013   Procedure: HARDWARE REMOVAL RIGHT TIBIA ;  Surgeon: Rozanna Box, MD;  Location: [place];  Service: Orthopedics;  Laterality: Right;   HIP CLOSED REDUCTION Right 10/15/2013   Procedure: CLOSED MANIPULATION HIP;  Surgeon: [name], MD;  [facility];  Service: Orthopedics;  Laterality: Right;   HOLMIUM LASER APPLICATION Right 12/14/91   Procedure: HOLMIUM LASER APPLICATION;  Surgeon: Cleon Gustin, MD;  [facility];  Service: Urology;  Laterality: Right;   HOLMIUM LASER APPLICATION Left 0/94/7096   Procedure: HOLMIUM LASER APPLICATION;  Surgeon: Cleon Gustin, MD;  [facility];  Service: Urology;  Laterality: Left;   HOLMIUM LASER APPLICATION Left 11/18/3660   Procedure: HOLMIUM LASER APPLICATION;  Surgeon: Cleon Gustin, MD;  [facility];  Service: Urology;  Laterality: Left;   HOLMIUM LASER APPLICATION Right 9/47/6546   Procedure: HOLMIUM LASER APPLICATION;  Surgeon: Cleon Gustin, MD;  Location: [facility];  Service: Urology;  Laterality: Right;   INCISION AND DRAINAGE HIP Right 11/16/2013   Procedure: IRRIGATION AND DEBRIDEMENT RIGHT HIP;  Surgeon: [name], MD;  [facility];  Service: Orthopedics;  Laterality: Right;   INTRAVASCULAR PRESSURE WIRE/FFR STUDY N/A 08/13/2019   Procedure: INTRAVASCULAR PRESSURE WIRE/FFR STUDY;  Surgeon: Nelva Bush, MD;  Location: [place] [facility];  Service: Cardiovascular;  Laterality: N/A;   IR IMAGING GUIDED PORT INSERTION  06/22/2018   IR NEPHROSTOMY PLACEMENT LEFT  03/28/2019   IR URETERAL STENT LEFT NEW ACCESS W/O SEP NEPHROSTOMY CATH  10/24/2017   IR URETERAL STENT LEFT NEW ACCESS W/O SEP NEPHROSTOMY CATH  03/26/2019   NEPHROLITHOTOMY Right 02/08/2017   Procedure: NEPHROLITHOTOMY PERCUTANEOUS WITH SURGEON ACCESS;  Surgeon: Cleon Gustin, MD;  [facility];  Service: Urology;  Laterality: Right;   NEPHROLITHOTOMY Left 10/24/2017   Procedure: NEPHROLITHOTOMY PERCUTANEOUS;  Surgeon: Cleon Gustin, MD;  [facility];  Service: Urology;  Laterality: Left;   NEPHROLITHOTOMY Left 03/26/2019   Procedure: NEPHROLITHOTOMY PERCUTANEOUS;  Surgeon: Cleon Gustin, MD;  [facility];  Service: Urology;  Laterality: Left;  2 HRS   NEPHROLITHOTOMY Left 04/17/2019   Procedure: NEPHROLITHOTOMY PERCUTANEOUS;  Surgeon: Cleon Gustin, MD;  [facility];  Service: Urology;  Laterality: Left;  2 HRS   ORIF TIBIA FRACTURE Right 02/06/2013   Procedure: OPEN REDUCTION INTERNAL FIXATION (ORIF) TIBIA FRACTURE WITH IM ROD FIBULA;  Surgeon: Rozanna Box, MD;  Location: [place];  Service: Orthopedics;  Laterality: Right;   ORIF TIBIA FRACTURE Right 07/03/2013   Procedure: RIGHT TIBIA NON UNION REPAIR ;  Surgeon: Rozanna Box, MD;  Location: [place];   Service: Orthopedics;  Laterality: Right;   ORIF WRIST FRACTURE  10/02/2012   Procedure: OPEN REDUCTION INTERNAL FIXATION (ORIF) WRIST FRACTURE;  Surgeon: Roseanne Kaufman, MD;  [facility];  Service: Orthopedics;  Laterality: Right;  WITH   ANTIBIOTIC  CEMENT   ORIF WRIST FRACTURE Left 10/28/2013   Procedure: OPEN REDUCTION INTERNAL FIXATION (ORIF) WRIST FRACTURE with allograft;  Surgeon: Roseanne Kaufman, MD;  [facility];  Service: Orthopedics;  Laterality: Left;  DVR Plate   QUADRICEPS TENDON REPAIR Left 07/15/2017   Procedure: REPAIR QUADRICEP TENDON;  Surgeon: Frederik Pear, MD;  Location: [place];  Service: Orthopedics;  Laterality: Left;   RIGHT/LEFT HEART CATH AND CORONARY ANGIOGRAPHY N/A 08/13/2019   Procedure: RIGHT/LEFT HEART CATH AND CORONARY ANGIOGRAPHY;  Surgeon: Nelva Bush, MD;  Location: [place] [facility];  Service: Cardiovascular;  Laterality: N/A;   THYROIDECTOMY  02/1986   TOTAL HIP ARTHROPLASTY Right 03-16-2016   '@[facility]'$    TOTAL HIP REVISION  10/05/2012   Procedure: TOTAL HIP REVISION;  Surgeon: [name], MD;  [facility];  Service: Orthopedics;  Laterality: Right;  RIGHT TOTAL HIP REVISION   TOTAL HIP REVISION Right 09/17/2013   Procedure: REVISION RIGHT TOTAL HIP ARTHROPLASTY ;  Surgeon: Pietro Cassis  Alvan Dame, MD;  [facility];  Service: Orthopedics;  Laterality: Right;   TOTAL HIP REVISION Right 10/26/2013   Procedure: REVISION RIGHT TOTAL HIP ARTHROPLASTY;  Surgeon: [name], MD;  [facility];  Service: Orthopedics;  Laterality: Right;   TOTAL KNEE ARTHROPLASTY Bilateral right 03-15-2011;  left 06-30-2011   TOTAL KNEE REVISION Left 04/11/2017   Procedure: TOTAL KNEE REVISION PATELLA and TIBIA;  Surgeon: Frederik Pear, MD;  Location: [place];  Service: Orthopedics;  Laterality: Left;   HPI:  Patient is a 76 y.o. male with PMH: diffuse large cell lymphoma, COPD, GERD, HLD, hypothyroidism, anxiety, CAD, a-fib, known nasopharyngeal mass, recent UTI who  presented to the hospital on 09/05/22 after having a witnessed fall.  He has had unintentional weight loss over past several months and per RD evaluation, he meeds criteria for moderate protein calorie malnutrition r/t chronic illness.    Assessment / Plan / Recommendation  Clinical Impression  Patient is not currently presenting with significant clinical s/s of dysphagia as per this bedside swallow evaluation. He denied any past or current dysphagia, denied esophageal/GERD and reported that he has been recently gaining back weight following unintentional weight loss. SLP observed patient with PO intake of thin liquids (water) via straw sips and although he did exhibit two instances of dry sounding, mildly delayed cough, SLP not highly suspicious of this being related to swallow function. He has no documented or reported h/o dysphagia and lungs are clear per CXR. SLP not recommending further skilled interventions at this time but please reconsult if any concerns for aspiration.Thank you for this consult! SLP Visit Diagnosis: Dysphagia, unspecified (R13.10)    Aspiration Risk  No limitations    Diet Recommendation Regular;Thin liquid   Liquid Administration via: Straw;Cup Medication Administration: Whole meds with liquid Supervision: Patient able to self feed Postural Changes: Seated upright at 90 degrees    Other  Recommendations Oral Care Recommendations: Oral care BID    Recommendations for follow up therapy are one component of a multi-disciplinary discharge planning process, led by the attending physician.  Recommendations may be updated based on patient status, additional functional criteria and insurance authorization.  Follow up Recommendations No SLP follow up      Assistance Recommended at Discharge    Functional Status Assessment Patient has not had a recent decline in their functional status  Frequency and Duration   N/A         Prognosis   N/A     Swallow Study    General Date of Onset: 09/06/22 HPI: Patient is a 76 y.o. male with PMH: diffuse large cell lymphoma, COPD, HLD, hypothyroidism, anxiety, CAD, a-fib, known nasopharyngeal mass, recent UTI who presented to the hospital on 09/05/22 after having a witnessed fall.  He has had unintentional weight loss over past several months and per RD evaluation, he meeds criteria for moderate protein calorie malnutrition r/t chronic illness. Type of Study: Bedside Swallow Evaluation Previous Swallow Assessment: none found Diet Prior to this Study: Regular;Thin liquids Temperature Spikes Noted: No Respiratory Status: Room air History of Recent Intubation: No Behavior/Cognition: Cooperative;Pleasant mood;Alert Oral Cavity Assessment: Within Functional Limits Oral Care Completed by SLP: No Oral Cavity - Dentition: Adequate natural dentition Vision: Functional for self-feeding Self-Feeding Abilities: Able to feed self Patient Positioning: Upright in bed Baseline Vocal Quality: Normal Volitional Cough: Strong Volitional Swallow: Able to elicit    Oral/Motor/Sensory Function Overall Oral Motor/Sensory Function: Within functional limits   Ice Chips  Thin Liquid Thin Liquid: Within functional limits Other Comments: Patient with two instances of milldly delayed, dry sounding cough but otherwise appeared [facility] for swallow function    Nectar Thick     Honey Thick     Puree Puree: Not tested   Solid     Solid: Not tested      Sonia Baller, MA, CCC-SLP Speech Therapy

## 2022-09-06 NOTE — Progress Notes (Signed)
Initial Nutrition Assessment  DOCUMENTATION CODES:  Non-severe (moderate) malnutrition in context of chronic illness  INTERVENTION:  Liberalize diet to regular to optimize po intake Provide double portions per pt request Provide Ensure Enlive or Ensure Plus HP (depending on availability) BID to provide 350kcal and 20g protein/bottle Resume MVI Consider resuming vitamin C and vitamin D for wound healing as clinically appropriate  NUTRITION DIAGNOSIS:  Moderate Malnutrition related to chronic illness as evidenced by percent weight loss, moderate fat depletion, moderate muscle depletion.  GOAL:  Patient will meet greater than or equal to 90% of their needs  MONITOR:  PO intake, Supplement acceptance  REASON FOR ASSESSMENT:  Consult Assessment of nutrition requirement/status  ASSESSMENT:  Pt is a 76yo M with Pmh of ESBL, Diffuse large cell lymphoma, HLD, hypothyroidism, anxiety, CAD, A.fib and nasopharyngeal mass who presents after fall at home.  Visited pt at bedside with husband present. Pt reports struggling with unintentional weight loss over the last several months. He has been trying to eat larger portions, choosing high calorie foods and using supplements, but continues to lose weight. Weight history in EMR shows a significant 8% unintended weight loss in the last 1.5 months. NFPE reveals mild to moderate fat loss and mild to moderate muscle wasting. Pt meets ASPEN criteria for moderate protein calorie malnutrition r/t chronic illness.  Pt with wound on hip from radiation. C/o hair loss but this has slowed with increased po intake. Pt takes MVI, vitamin D, vitamin C, calcium w/ vitamin D, iron, magnesium, potassium and probiotic at home. Recommend resuming MVI. Consider also resuming vitamin D and vitamin C for wound healing. Discussed ONS, pt agreeable to Strawberry Ensure Enlive/Ensure Plus HP BID to provide 350kcal and 20g protein/bottle. Also recommend liberalizing diet to  regular to optimize po intake. Pt requested larger portions, changes made in diet office software.  Medications reviewed and include: lipitor, dexamethasone, synthroid, lopressor, protonix, xarelto, LR at 152m/hr  Labs reviewed: BG:66-136, BUN:39, Cr:1.52, GFR:47   NUTRITION - FOCUSED PHYSICAL EXAM:  Flowsheet Row Most Recent Value  Orbital Region Mild depletion  Upper Arm Region Moderate depletion  Thoracic and Lumbar Region Moderate depletion  Buccal Region Mild depletion  Temple Region Moderate depletion  Clavicle Bone Region Moderate depletion  Clavicle and Acromion Bone Region Mild depletion  Scapular Bone Region Unable to assess  Dorsal Hand Mild depletion  Patellar Region Moderate depletion  Anterior Thigh Region Mild depletion  Posterior Calf Region Mild depletion  Hair Reviewed  [falling out, but slowed with vitamins and improved intake]  Eyes Reviewed  Mouth Reviewed  Skin Reviewed  Nails Reviewed       Diet Order:   Diet Order             Diet regular Room service appropriate? Yes; Fluid consistency: Thin  Diet effective now                   EDUCATION NEEDS:   Education needs have been addressed  Skin:  Skin Assessment: Skin Integrity Issues: Skin Integrity Issues:: Other (Comment) Other: left hip ulcer, cancerous lesion being treated with radiation  Last BM:  11/27  Height:  Ht Readings from Last 1 Encounters:  09/05/22 '6\' 4"'$  (1.93 m)   Weight:  Wt Readings from Last 1 Encounters:  09/05/22 105.2 kg    BMI:  Body mass index is 28.24 kg/m.  Estimated Nutritional Needs:  Kcal:  2100-2600kcal Protein:  105-130g Fluid:  >21043m KaCandise BowensMS, RD, LDN,  CNSC See AMiON for contact information

## 2022-09-06 NOTE — Progress Notes (Signed)
Echocardiogram 2D Echocardiogram has been performed.  [name] 09/06/2022, 1:30 PM

## 2022-09-06 NOTE — Evaluation (Signed)
Occupational Therapy Evaluation and Discharge Patient Details Name: [name] MRN: [id number] DOB: [birth year] Today's Date: 09/06/2022   History of Present Illness [name] is a 76 y.o. male with medical history significant of ESBL, Diffuse large cell lymphoma, hyperlipidemia, hypothyroidism, and anxiety, CAD, A.fib on Xarelto Known nasopharyngeal mass, recent UTI, presents 09/05/22 after having a witnessed fall.  Patient states that he became dizzy and lightheaded while using his walker   Clinical Impression   This  76 yo male admitted with above presents to acute OT with PLOF of just having graduated from [facility] and able to do all of his basic ADLs and IADLs from RW level at home. Currently pt is at an overall min guard A level from RW level when up on his feet and has this available to him at home from his husband. No further OT needs identified, we will sign off.     Recommendations for follow up therapy are one component of a multi-disciplinary discharge planning process, led by the attending physician.  Recommendations may be updated based on patient status, additional functional criteria and insurance authorization.   Follow Up Recommendations  No OT follow up     Assistance Recommended at Discharge PRN  Patient can return home with the following Assistance with cooking/housework    Functional Status Assessment  Patient has had a recent decline in their functional status and demonstrates the ability to make significant improvements in function in a reasonable and predictable amount of time. (without further need for skilled OT)  Equipment Recommendations  None recommended by OT       Precautions / Restrictions Precautions Precautions: Fall Precaution Comments: monitor BP, no orthostais today Restrictions Weight Bearing Restrictions: No      Mobility Bed Mobility Overal bed mobility: Modified Independent Bed Mobility: Supine to Sit     Supine to sit:  Modified independent (Device/Increase time), HOB elevated          Transfers Overall transfer level: Needs assistance Equipment used: Rolling walker (2 wheels) Transfers: Sit to/from Stand Sit to Stand: Min guard                  Balance Overall balance assessment: History of Falls, Needs assistance Sitting-balance support: No upper extremity supported, Feet supported Sitting balance-Leahy Scale: Good     Standing balance support: Bilateral upper extremity supported Standing balance-Leahy Scale: Poor                             ADL either performed or assessed with clinical judgement   ADL Overall ADL's : Needs assistance/impaired                                       General ADL Comments: min guard A-S when up on his feet with RW     Vision Patient Visual Report: No change from baseline              Pertinent Vitals/Pain Pain Assessment Pain Assessment: No/denies pain     Hand Dominance Left   Extremity/Trunk Assessment Upper Extremity Assessment Upper Extremity Assessment: Overall WFL for tasks assessed      Communication Communication Communication: No difficulties   Cognition Arousal/Alertness: Awake/alert Behavior During Therapy: WFL for tasks assessed/performed Overall Cognitive Status: Within Functional Limits for tasks assessed  Home Living Family/patient expects to be discharged to:: Private residence Living Arrangements: Spouse/significant other Available Help at Discharge: Available 24 hours/day;Family Type of Home: House Home Access: Stairs to enter [employer] of Steps: 4 Entrance Stairs-Rails: Right Home Layout: Two level Alternate Level Stairs-Number of Steps: has chair lift - still needs to get up 4 steps to chair lift   Bathroom Shower/Tub: Occupational psychologist: Handicapped height Bathroom Accessibility:  Yes How Accessible: Accessible via walker Home Equipment: [place] (2 wheels);Cane - single point;Wheelchair - Counsellor (4 wheels)          Prior Functioning/Environment Prior Level of Function : Independent/Modified Independent;Driving             Mobility Comments: amb with RW in home and community ADLs Comments: independent with ADLs        OT Problem List: Cardiopulmonary status limiting activity (a little SOB compared to normal  per pt)         OT Goals(Current goals can be found in the care plan section) Acute Rehab OT Goals Patient Stated Goal: to go back home soon and not end up back in hospital any time soon         AM-PAC OT "6 Clicks" Daily Activity     Outcome Measure Help from another person eating meals?: None Help from another person taking care of personal grooming?: A Little Help from another person toileting, which includes using toliet, bedpan, or urinal?: A Little Help from another person bathing (including washing, rinsing, drying)?: A Little Help from another person to put on and taking off regular upper body clothing?: A Little Help from another person to put on and taking off regular lower body clothing?: A Little 6 Click Score: 19   End of Session Equipment Utilized During Treatment: Rolling walker (2 wheels)  Activity Tolerance: Patient tolerated treatment well Patient left: in chair;with call bell/phone within reach;with chair alarm set  OT Visit Diagnosis: Muscle weakness (generalized) (M62.81)                Time: 2979-8921 OT Time Calculation (min): 22 min Charges:  OT General Charges $OT Visit: 1 Visit OT Evaluation $OT Eval Moderate Complexity: 1 Mod [place], OTR/L Acute Rehab Services [facility] [phone] Office [phone]    Almon Register 09/06/2022, 12:44 PM

## 2022-09-06 NOTE — Telephone Encounter (Signed)
Notified Allyce w/ MD response.Marland KitchenJohny Chess

## 2022-09-06 NOTE — Progress Notes (Signed)
Physical Therapy Treatment Patient Details Name: [name] MRN: [id number] DOB: [birth year] Today's Date: 09/06/2022   History of Present Illness [name] is a 76 y.o. male with medical history significant of ESBL, Diffuse large cell lymphoma, hyperlipidemia, hypothyroidism, and anxiety, CAD, A.fib on Xarelto Known nasopharyngeal mass, recent UTI, presents 09/05/22 after having a witnessed fall.  Patient states that he became dizzy and lightheaded while using his walker    PT Comments    Patient  demonstrates improved mobility and steadiness to ambulate in the room using RW. Patient  reports that he is feeling stronger. Continue ambulation .   Recommendations for follow up therapy are one component of a multi-disciplinary discharge planning process, led by the attending physician.  Recommendations may be updated based on patient status, additional functional criteria and insurance authorization.  Follow Up Recommendations  Home health PT     Assistance Recommended at Discharge Intermittent Supervision/Assistance  Patient can return home with the following A little help with walking and/or transfers;Assistance with cooking/housework;Assist for transportation;Help with stairs or ramp for entrance   Equipment Recommendations  None recommended by PT    Recommendations for Other Services       Precautions / Restrictions Precautions Precautions: Fall Precaution Comments: monitor BP, no orthostais today, wears briefs for incontinence Restrictions Weight Bearing Restrictions: No     Mobility  Bed Mobility Overal bed mobility: Modified Independent Bed Mobility: Supine to Sit, Sit to Supine     Supine to sit: Modified independent (Device/Increase time), HOB elevated Sit to supine: Min guard   General bed mobility comments: extra time,    Transfers Overall transfer level: Needs assistance Equipment used: Rolling walker (2 wheels) Transfers: Sit to/from Stand Sit  to Stand: Min guard           General transfer comment: much improved  and safety this visit    Ambulation/Gait Ambulation/Gait assistance: Supervision Gait Distance (Feet): 20 Feet Assistive device: Rolling walker (2 wheels) Gait Pattern/deviations: Step-through pattern, Trunk flexed, Shuffle Gait velocity: decr     General Gait Details: patient much steadier this visit and able to ambuate in room, no dizziness.   Stairs             Wheelchair Mobility    Modified Rankin (Stroke Patients Only)       Balance Overall balance assessment: History of Falls, Needs assistance Sitting-balance support: No upper extremity supported, Feet supported Sitting balance-Leahy Scale: Good     Standing balance support: Bilateral upper extremity supported, Reliant on assistive device for balance, During functional activity Standing balance-Leahy Scale: Poor                              Cognition Arousal/Alertness: Awake/alert Behavior During Therapy: WFL for tasks assessed/performed Overall Cognitive Status: Within Functional Limits for tasks assessed                                 General Comments: states that he is  anxious that he i s in hospital again        Exercises      General Comments        Pertinent Vitals/Pain Pain Assessment Pain Assessment: No/denies pain    Home Living Family/patient expects to be discharged to:: Private residence Living Arrangements: Spouse/significant other Available Help at Discharge: Available 24 hours/day;Family Type of Home: House Home Access:  Stairs to enter Entrance Stairs-Rails: Right Entrance Stairs-Number of Steps: 4 Alternate Level Stairs-Number of Steps: has chair lift - still needs to get up 4 steps to chair lift Home Layout: Two level Home Equipment: Cooter (2 wheels);Cane - single point;Wheelchair - Counsellor (4 wheels)      Prior Function            PT  Goals (current goals can now be found in the care plan section) Acute Rehab PT Goals Patient Stated Goal: go home, not come back PT Goal Formulation: With patient Time For Goal Achievement: 09/20/22 Potential to Achieve Goals: Fair Progress towards PT goals: Progressing toward goals    Frequency    Min 3X/week      PT Plan Current plan remains appropriate    Co-evaluation              AM-PAC PT "6 Clicks" Mobility   Outcome Measure  Help needed turning from your back to your side while in a flat bed without using bedrails?: None Help needed moving from lying on your back to sitting on the side of a flat bed without using bedrails?: None Help needed moving to and from a bed to a chair (including a wheelchair)?: A Little Help needed standing up from a chair using your arms (e.g., wheelchair or bedside chair)?: A Little Help needed to walk in hospital room?: A Little Help needed climbing 3-5 steps with a railing? : A Lot 6 Click Score: 19    End of Session Equipment Utilized During Treatment: Gait belt Activity Tolerance: Patient tolerated treatment well Patient left: in chair;with chair alarm set;with call bell/phone within reach Nurse Communication: Mobility status PT Visit Diagnosis: Unsteadiness on feet (R26.81)     Time: 8616-8372 PT Time Calculation (min) (ACUTE ONLY): 22 min  Charges:  $Gait Training: 8-22 mins                    [place] Office [phone] Weekend pager-[phone]    Claretha Cooper 09/06/2022, 2:26 PM

## 2022-09-06 NOTE — Progress Notes (Signed)
Consultation Progress Note   Patient: [name] ULA:[id number] DOB: 14-Mar-1946 DOA: 09/05/2022 DOS: the patient was seen and examined on 09/06/2022 Primary service: DibiaManfred Shirts, MD  Brief hospital course: 76 y.o. male with medical history significant of ESBL, Diffuse large cell lymphoma, hyperlipidemia, hypothyroidism, and anxiety, CAD, A.fib on Xarelto Known nasopharyngeal mass, recent UTI, presents 09/05/22 after having a witnessed fall.  Patient states that he became dizzy and lightheaded while using his walker   Assessment and Plan: AKI (acute kidney injury) ([place]) -  evidence of acute renal failure due to presence of following: Cr increased >0.3 from baseline  -Continue IVF -sCR currently at 1.52, trend CMP      Hypothyroidism Check TSH and continue synthroid 125 mcg po daily  Dose has been changed recently will need to clarify in AM   Essential hypertension Resume metoprolol 25 mg p.o. twice daily   Chronic diastolic heart failure (HCC) Currently appears to be dry we will rehydrate monitor fluid status   Paroxysmal atrial fibrillation (HCC) Continue with Toprol 25 mg p.o. twice daily and Xarelto   Chronic obstructive pulmonary disease (HCC) Chronic and stable   Diffuse large B-cell lymphoma (HCC) Continue Decadron   Stage 3a chronic kidney disease (CKD) (HCC) - Baseline Scr 1.5 - 1.6  -chronic avoid nephrotoxic medications such as NSAIDs, Vanco Zosyn combo,  avoid hypotension, continue to follow renal function     History of recurrent UTIs Abnormal UA with hematuria.  Up to await results of urine culture.  Patient at this point afebrile Request ID consult to see in a.m. to further evaluate need for   antibiotics History of ESBL in the past   Normocytic anemia Chronic stable continue to monitor   CAD (coronary artery disease) Continue Lipitor 40 mg p.o. daily   Generalized weakness Patient with frequent falls will have PT OT evaluation   Other plan  as per orders.   DVT prophylaxis:  on xarelto         TRH will continue to follow the patient.  Subjective:No complaints this morning,. Feels less dizzy ambulating to the bathroom  Physical Exam: Vitals:   09/05/22 2030 09/05/22 2130 09/05/22 2357 09/06/22 0409  BP: 120/79 118/62 137/80 105/65  Pulse: 85 94 74 66  Resp: '18 19 16 14  '$ Temp:   (!) 97.5 F (36.4 C)   TempSrc:   Oral   SpO2: 97% 98% 99% 96%  Weight:      Height:       General:  in No  Acute distress  Head/ENT Dry Mucous Membranes                   SKIN:  decreased Skin turgor,  Skin clean Dry and intact abrasions on the feet bilaterally Heart: Regular rate and rhythm no  Murmur, no Rub or gallop  Lungs: no wheezes or crackles   Abdomen: Soft,  non-tender, Non distended   Lower extremities: no clubbing, cyanosis, no  edema  Neurologically Grossly intact, moving all 4 extremities equally   MSK: Normal range of motion Data Reviewed:  There are no new results to review at this time.  Family Communication:   Time spent: 15 minutes.  Author: Cristela Felt, MD 09/06/2022 12:07 PM  For on call review [email or web address].

## 2022-09-06 NOTE — TOC Progression Note (Signed)
Transition of Care [facility]) - Progression Note    Patient Details  Name: [name] MRN: [id number] Date of Birth: 1945-10-21  Transition of Care [facility]) CM/SW [place], RN Phone Number:[phone]  09/06/2022, 8:11 AM  Clinical Narrative:    [facility] acknowledges consult for SNF placement. TOC will initiate SNF workup.       Expected Discharge Plan and Services                                                 Social Determinants of Health (SDOH) Interventions    Readmission Risk Interventions    04/27/2022    9:56 AM 04/26/2022    6:28 PM 03/22/2022   10:07 AM  Readmission Risk Prevention Plan  Transportation Screening  Complete Complete  PCP or Specialist Appt within 3-5 Days   Complete  HRI or [place]   Complete  Social Work Consult for [place] Planning/Counseling   Complete  Palliative Care Screening   Complete  Medication Review [employer])  Referral to Pharmacy Complete  PCP or Specialist appointment within 3-5 days of discharge Complete    HRI or [place]  Complete   SW Recovery Care/Counseling Consult Complete    [place]  Not Applicable

## 2022-09-06 NOTE — Evaluation (Signed)
Physical Therapy Evaluation Patient Details Name: [name] MRN: [id number] DOB: 17-Oct-1945 Today's Date: 09/06/2022  History of Present Illness  [name] is a 76 y.o. male with medical history significant of ESBL, Diffuse large cell lymphoma, hyperlipidemia, hypothyroidism, and anxiety, CAD, A.fib on Xarelto Known nasopharyngeal mass, recent UTI, presents 09/05/22 after having a witnessed fall.  Patient states that he became dizzy and lightheaded while using his walker  Clinical Impression  Pt admitted with above diagnosis.  Pt currently with functional limitations due to the deficits listed below (see PT Problem List). Pt will benefit from skilled PT to increase their independence and safety with mobility to allow discharge to the venue listed below.    The patient reports that he has HHPT coming since last admission.  Orthostatics taken, noted drop in BP, patient reported no dizziness. Only stood at bed edge( 3 min standing  not recorded as PT failed to enter._ it was in the  60/40 range).Placed in doc flow sheets.  The patient noted to have  leg tremors when standing . Patient  reports that he ambulated to BR earlier and was not dizzy.patient plans to return home with family support.          Recommendations for follow up therapy are one component of a multi-disciplinary discharge planning process, led by the attending physician.  Recommendations may be updated based on patient status, additional functional criteria and insurance authorization.  Follow Up Recommendations Home health PT      Assistance Recommended at Discharge Intermittent Supervision/Assistance  Patient can return home with the following  A little help with walking and/or transfers;Assistance with cooking/housework;Assist for transportation;Help with stairs or ramp for entrance    Equipment Recommendations None recommended by PT  Recommendations for Other Services       Functional Status Assessment  Patient has had a recent decline in their functional status and demonstrates the ability to make significant improvements in function in a reasonable and predictable amount of time.     Precautions / Restrictions Precautions Precautions: Fall Precaution Comments: monitor BP, not symptomatic todya. Restrictions Weight Bearing Restrictions: No      Mobility  Bed Mobility Overal bed mobility: Needs Assistance Bed Mobility: Supine to Sit, Sit to Supine     Supine to sit: Min guard, HOB elevated Sit to supine: Min guard   General bed mobility comments: extra time,    Transfers Overall transfer level: Needs assistance Equipment used: Rolling walker (2 wheels) Transfers: Sit to/from Stand Sit to Stand: Min assist, From elevated surface           General transfer comment: steady assist to power up to stand. Noted legs tremor when standing  static when orthostatics being taken.  fatigued and had to sit down quickly(pt. reports that he did ambulate to BR with Rw and assistance today.    Ambulation/Gait               General Gait Details: NT  Stairs            Wheelchair Mobility    Modified Rankin (Stroke Patients Only)       Balance Overall balance assessment: History of Falls, Needs assistance Sitting-balance support: Feet supported, No upper extremity supported       Standing balance support: During functional activity, Bilateral upper extremity supported, Reliant on assistive device for balance Standing balance-Leahy Scale: Poor  Pertinent Vitals/Pain Pain Assessment Pain Assessment: No/denies pain    Home Living Family/patient expects to be discharged to:: Private residence Living Arrangements: Spouse/significant other Available Help at Discharge: Available 24 hours/day;Family Type of Home: House Home Access: Stairs to enter Entrance Stairs-Rails: Right Entrance Stairs-Number of Steps: 4 Alternate  Level Stairs-Number of Steps: has chair lift - still needs to get up 4 steps to chair lift Home Layout: Two level Home Equipment: Rolling Walker (2 wheels);Cane - single point;Wheelchair - Counsellor (4 wheels)      Prior Function Prior Level of Function : Independent/Modified Independent;Driving             Mobility Comments: amb with RW in home and community ADLs Comments: independent with ADLs     Hand Dominance   Dominant Hand: Left    Extremity/Trunk Assessment        Lower Extremity Assessment Lower Extremity Assessment: Generalized weakness;LLE deficits/detail LLE Deficits / Details: noted deformity at the knee    Cervical / Trunk Assessment Cervical / Trunk Assessment: Kyphotic  Communication   Communication: No difficulties  Cognition Arousal/Alertness: Awake/alert Behavior During Therapy: WFL for tasks assessed/performed Overall Cognitive Status: Within Functional Limits for tasks assessed                                 General Comments: states that he is  anxious that he i s in hospital again        General Comments      Exercises     Assessment/Plan    PT Assessment Patient needs continued PT services  PT Problem List Decreased strength;Decreased mobility;Decreased knowledge of precautions;Decreased activity tolerance;Decreased balance;Decreased knowledge of use of DME       PT Treatment Interventions DME instruction;Therapeutic activities;Gait training;Therapeutic exercise;Patient/family education;Functional mobility training    PT Goals (Current goals can be found in the Care Plan section)  Acute Rehab PT Goals Patient Stated Goal: go home, not come back PT Goal Formulation: With patient Time For Goal Achievement: 09/20/22 Potential to Achieve Goals: Fair    Frequency Min 3X/week     Co-evaluation               AM-PAC PT "6 Clicks" Mobility  Outcome Measure Help needed turning from your back  to your side while in a flat bed without using bedrails?: None Help needed moving from lying on your back to sitting on the side of a flat bed without using bedrails?: None Help needed moving to and from a bed to a chair (including a wheelchair)?: A Lot Help needed standing up from a chair using your arms (e.g., wheelchair or bedside chair)?: A Lot Help needed to walk in hospital room?: Total Help needed climbing 3-5 steps with a railing? : Total 6 Click Score: 14    End of Session   Activity Tolerance: Patient limited by fatigue Patient left: in bed;with call bell/phone within reach;with bed alarm set Nurse Communication: Mobility status PT Visit Diagnosis: Unsteadiness on feet (R26.81)    Time: 8546-2703 PT Time Calculation (min) (ACUTE ONLY): 25 min   Charges:   PT Evaluation $PT Eval Low Complexity: 1 Low PT Treatments $Therapeutic Activity: 8-22 mins        .s  Claretha Cooper 09/06/2022, 11:33 AM

## 2022-09-06 NOTE — Consult Note (Signed)
[facility] Nurse Consult Note: Reason for Consult:left hip ulcer Per patient report this is a cancerous lesion being treated with radiation currently at [facility]. Wound type:full thickness; malignancy  Pressure Injury POA: NA Measurement: 3cm x 5cm x 1.0cm  Wound bed:100% pink; yellow fibrin present Drainage (amount, consistency, odor) thick, yellow, no odor Periwound:intact  Dressing procedure/placement/frequency: Per POC from patient's report from home and current wound bed status Cleanse periwound and wound with saline, pat dry Apply zinc barrier to periwound Pack wound bed with silver hydrofiber (Aquacel AG+) and top with silicone foam.  Change daily  Follow up with [facility], they are following this patient for this wound  Discussed POC with patient and bedside nurse.  Re consult if needed, will not follow at this time. Thanks  Pride Gonzales [employer], RN,CWOCN, CNS, [place] [phone])

## 2022-09-07 ENCOUNTER — Other Ambulatory Visit (HOSPITAL_COMMUNITY): Payer: Self-pay

## 2022-09-07 LAB — BASIC METABOLIC PANEL
Anion gap: 7 (ref 5–15)
BUN: 29 mg/dL — ABNORMAL HIGH (ref 8–23)
CO2: 20 mmol/L — ABNORMAL LOW (ref 22–32)
Calcium: 8 mg/dL — ABNORMAL LOW (ref 8.9–10.3)
Chloride: 106 mmol/L (ref 98–111)
Creatinine, Ser: 1.12 mg/dL (ref 0.61–1.24)
GFR, Estimated: >60 mL/min (ref >60)
Glucose, Bld: 198 mg/dL — ABNORMAL HIGH (ref 70–99)
Potassium: 4.3 mmol/L (ref 3.5–5.1)
Sodium: 133 mmol/L — ABNORMAL LOW (ref 135–145)

## 2022-09-07 LAB — URINE CULTURE: Culture: 20000 — AB

## 2022-09-07 LAB — PROCALCITONIN: Procalcitonin: <0.1 ng/mL

## 2022-09-07 LAB — OSMOLALITY, URINE: Osmolality, Ur: 591 mOsm/kg (ref 300–900)

## 2022-09-07 MED ORDER — CEPHALEXIN 500 MG PO CAPS
500.0000 mg | ORAL_CAPSULE | Freq: Four times a day (QID) | ORAL | 0 refills | Status: AC
  Filled 2022-09-07: qty 28, 7d supply, fill #0

## 2022-09-07 MED ORDER — SODIUM CHLORIDE 0.9 % IV SOLN
2.0000 g | INTRAVENOUS | Status: DC
  Administered 2022-09-07: 2 g via INTRAVENOUS
  Filled 2022-09-07: qty 20

## 2022-09-07 NOTE — Care Management Obs Status (Signed)
[place] NOTIFICATION   Patient Details  Name: [name] MRN: [id number] Date of Birth: 08-18-1946   Medicare Observation Status Notification Given:  Yes    Angelita Ingles, RN 09/07/2022, 8:33 AM

## 2022-09-07 NOTE — Discharge Summary (Addendum)
Physician Discharge Summary   Patient: [name] MRN: [id number] DOB: Dec 22, 1945  Admit date:     09/05/2022  Discharge date: 09/07/22  Discharge Physician: [name] Jaiceon Collister   PCP: Cassandria Anger, MD   Recommendations at discharge:    Follow up with PCP post DC  Discharge Diagnoses: Active Problems:   AKI (acute kidney injury) ([place])   Hypothyroidism   Essential hypertension   Chronic obstructive pulmonary disease (HCC)   Diffuse large B-cell lymphoma (HCC)   Chronic diastolic heart failure (HCC)   Paroxysmal atrial fibrillation (HCC)   Stage 3a chronic kidney disease (CKD) (HCC) - Baseline Scr 1.5 - 1.6   History of recurrent UTIs   Normocytic anemia   CAD (coronary artery disease)   Generalized weakness   Syncope  Resolved Problems:   * No resolved hospital problems. *  Hospital Course: 76 y.o. male with medical history significant of ESBL, Diffuse large cell lymphoma, hyperlipidemia, hypothyroidism, and anxiety, CAD, A.fib on Xarelto Known nasopharyngeal mass, recent UTI, presents 09/05/22 after having a witnessed fall.  Patient states that he became dizzy and lightheaded while using his walker.  It was noted to be orthostatic upon admission.  He was placed on IV fluids.  Labs were concerning for AKI as well.  Was seen by physical therapy who recommended home PT at discharge.  Symptoms have significantly improved, he reports less dizziness with ambulation.  Serum creatinine is back to baseline.  He is medically stable for discharge.  He is advised to follow-up with his primary care physician.  Assessment and Plan: UTI -Urine cultures growing Proteus, treated with ceftriaxone, will discharge on Keflex. AKI (acute kidney injury) ([place]) -Resolved, was prerenal in nature due to dehydration. Creatinine back to baseline.      Hypothyroidism Continue Synthroid.   Essential hypertension Resume metoprolol 25 mg p.o. twice daily   Chronic diastolic heart failure  (HCC) Currently appears to be dry we will rehydrate monitor fluid status   Paroxysmal atrial fibrillation (HCC) Continue with Toprol 25 mg p.o. twice daily and Xarelto   Chronic obstructive pulmonary disease ([place]) Chronic and stable   Diffuse large B-cell lymphoma ([place]) Patient reports that he was discontinued on Decadron.   Stage 3a chronic kidney disease (CKD) (HCC) - Baseline Scr 1.5 - 1.6  -chronic avoid nephrotoxic medications such as NSAIDs, Vanco Zosyn combo,  avoid hypotension, continue to follow renal function     History of recurrent UTIs Abnormal UA with hematuria.  Culture growing Proteus discharged on antibiotics.  Normocytic anemia Chronic stable continue to monitor   CAD (coronary artery disease) Continue Lipitor 40 mg p.o. daily   Generalized weakness Patient with frequent falls will have PT OT evaluation        Consultants:  Procedures performed:   Disposition: Home Diet recommendation:  Cardiac diet DISCHARGE MEDICATION: Allergies as of 09/07/2022       Reactions   Short Ragweed Pollen Ext Cough        Medication List     TAKE these medications    acetaminophen 325 MG tablet Commonly known as: TYLENOL Take 2 tablets (650 mg total) by mouth every 6 (six) hours as needed for mild pain (or Fever >/= 101).   acyclovir 400 MG tablet Commonly known as: ZOVIRAX Take 400 mg by mouth 2 (two) times daily.   albuterol 108 (90 Base) MCG/ACT inhaler Commonly known as: VENTOLIN HFA Inhale 2 puffs into the lungs every 6 (six) hours as needed for wheezing  or shortness of breath.   alfuzosin 10 MG 24 hr tablet Commonly known as: UROXATRAL TAKE 1 TABLET AT BEDTIME   allopurinol 100 MG tablet Commonly known as: ZYLOPRIM Take 1 tablet (100 mg total) by mouth 2 (two) times daily.   allopurinol 300 MG tablet Commonly known as: ZYLOPRIM Take 300 mg by mouth daily.   ascorbic acid 500 MG tablet Commonly known as: VITAMIN C Take 500 mg by mouth  daily with lunch.   atorvastatin 80 MG tablet Commonly known as: LIPITOR TAKE 1 TABLET EVERY DAY   buPROPion 300 MG 24 hr tablet Commonly known as: WELLBUTRIN XL TAKE 1 TABLET EVERY DAY WITH BREAKFAST What changed: See the new instructions.   CALCIUM CITRATE + D PO Take 1 tablet by mouth 2 (two) times daily. '1200mg'$  of Calcium and 1000 units vitamin D3   cephALEXin 500 MG capsule Commonly known as: KEFLEX Take 1 capsule (500 mg total) by mouth 4 (four) times daily for 7 days.   clonazePAM 1 MG tablet Commonly known as: KLONOPIN Take 1 tablet (1 mg total) by mouth 3 (three) times daily as needed for anxiety.   CRANBERRY PO Take 1 tablet by mouth in the morning and at bedtime.   denosumab 60 MG/ML Sosy injection Commonly known as: PROLIA Inject 60 mg as directed every 6 (six) months.   DULoxetine 60 MG capsule Commonly known as: CYMBALTA TAKE 1 CAPSULE TWICE DAILY   EPINEPHrine 0.3 mg/0.3 mL Soaj injection Commonly known as: EPI-PEN Inject 0.3 mg into the muscle once as needed for anaphylaxis (severe allergic reaction).   feeding supplement Liqd Take 237 mLs by mouth 2 (two) times daily between meals. What changed: when to take this   ferrous sulfate 325 (65 FE) MG tablet Take 325 mg by mouth 2 (two) times daily with a meal.   HYDROcodone-acetaminophen 7.5-325 MG tablet Commonly known as: NORCO Take 1 tablet by mouth every 6 (six) hours as needed for severe pain.   levothyroxine 125 MCG tablet Commonly known as: SYNTHROID Take 1 tablet (125 mcg total) by mouth daily at 6 (six) AM.   lidocaine-prilocaine cream Commonly known as: EMLA Apply 1 Application topically as needed. What changed: reasons to take this   loratadine 10 MG tablet Commonly known as: CLARITIN Take 10 mg by mouth daily.   Magnesium 250 MG Tabs Take 1 tablet by mouth daily.   Magnesium 500 MG Caps Take 1 capsule (500 mg total) by mouth daily. What changed: when to take this   metoprolol  tartrate 25 MG tablet Commonly known as: LOPRESSOR Take 25 mg by mouth 2 (two) times daily.   multivitamin with minerals Tabs tablet Take 1 tablet by mouth every morning. Men's One-A-Day 50+   Myrbetriq 25 MG Tb24 tablet Generic drug: mirabegron ER TAKE 1 TABLET EVERY DAY What changed:  how much to take when to take this   nitroGLYCERIN 0.4 MG SL tablet Commonly known as: NITROSTAT Place 0.4 mg under the tongue every 5 (five) minutes as needed for chest pain.   omeprazole 40 MG capsule Commonly known as: PRILOSEC TAKE 1 CAPSULE EVERY DAY BEFORE BREAKFAST What changed: See the new instructions.   Potassium Citrate 15 MEQ (1620 MG) Tbcr Take 1 tablet by mouth See admin instructions. Take one tablet (15 meq) by mouth twice daily - with lunch and supper What changed:  when to take this additional instructions   PROBIOTIC PO Take 1 capsule by mouth daily with breakfast.   rivaroxaban  20 MG Tabs tablet Commonly known as: XARELTO Take 1 tablet (20 mg total) by mouth daily with supper.   SKYRIZI (150 MG DOSE) [place] Inject 150 mg into the skin every 4 (four) months.   testosterone enanthate 200 MG/ML injection Commonly known as: DELATESTRYL INJECT 1ML ('200MG'$ ) INTO THE MUSCLE EVERY 14 DAYS. DISCARD VIAL AFTER 28 DAYS. What changed:  how much to take how to take this when to take this additional instructions   Vitamin D 50 MCG (2000 UT) tablet Take 4,000 Units by mouth daily.        Discharge Exam: Filed Weights   09/05/22 1636  Weight: 105.2 kg  General:  in No  Acute distress  Head/ENT Dry Mucous Membranes                   SKIN:  decreased Skin turgor,  Skin clean Dry and intact abrasions on the feet bilaterally Heart: Regular rate and rhythm no  Murmur, no Rub or gallop  Lungs: no wheezes or crackles   Abdomen: Soft,  non-tender, Non distended   Lower extremities: no clubbing, cyanosis, no  edema  Neurologically Grossly intact, moving all 4 extremities equally    MSK: Normal range of motion   Condition at discharge: good  The results of significant diagnostics from this hospitalization (including imaging, microbiology, ancillary and laboratory) are listed below for reference.   Imaging Studies: ECHOCARDIOGRAM COMPLETE  Result Date: 09/06/2022    ECHOCARDIOGRAM REPORT   Patient Name:   EASTYN DATTILO Date of Exam: 09/06/2022 Medical Rec #:  [id number]        Height:       76.0 in Accession #:    [id number]       Weight:       232.0 lb Date of Birth:  09-24-1946        BSA:          2.359 m Patient Age:    14 years         BP:           105/65 mmHg Patient Gender: M                HR:           90 bpm. Exam Location:  Inpatient Procedure: 2D Echo, Cardiac Doppler and Color Doppler Indications:    R55 Syncope  History:        Patient has prior history of Echocardiogram examinations, most                 recent 06/28/2019. CHF, CAD, Arrythmias:Atrial Fibrillation,                 Signs/Symptoms:Syncope; Risk Factors:Hypertension, Dyslipidemia                 and GERD.  Sonographer:    Bernadene Person RDCS Referring Phys: [place]  Sonographer Comments: No parasternal window. IMPRESSIONS  1. Left ventricular ejection fraction, by estimation, is 35 to 40%. The left ventricle has moderately decreased function. The left ventricle demonstrates global hypokinesis. Left ventricular diastolic parameters are indeterminate.  2. Right ventricular systolic function is mildly reduced. The right ventricular size is normal. Tricuspid regurgitation signal is inadequate for assessing PA pressure.  3. Left atrial size was mildly dilated.  4. Right atrial size was mildly dilated.  5. The mitral valve is normal in structure. No evidence of mitral valve regurgitation. No evidence of mitral stenosis.  6. The  aortic valve was not well visualized. There is mild calcification of the aortic valve. Aortic valve regurgitation is not visualized. No aortic stenosis is present.  7.  The inferior vena cava is dilated in size with >50% respiratory variability, suggesting right atrial pressure of 8 mmHg.  8. The patient was in atrial fibrillation. FINDINGS  Left Ventricle: Left ventricular ejection fraction, by estimation, is 35 to 40%. The left ventricle has moderately decreased function. The left ventricle demonstrates global hypokinesis. The left ventricular internal cavity size was normal in size. There is no left ventricular hypertrophy. Left ventricular diastolic parameters are indeterminate. Right Ventricle: The right ventricular size is normal. No increase in right ventricular wall thickness. Right ventricular systolic function is mildly reduced. Tricuspid regurgitation signal is inadequate for assessing PA pressure. The tricuspid regurgitant velocity is 1.39 m/s, and with an assumed right atrial pressure of 8 mmHg, the estimated right ventricular systolic pressure is 85.0 mmHg. Left Atrium: Left atrial size was mildly dilated. Right Atrium: Right atrial size was mildly dilated. Pericardium: There is no evidence of pericardial effusion. Mitral Valve: The mitral valve is normal in structure. No evidence of mitral valve regurgitation. No evidence of mitral valve stenosis. Tricuspid Valve: The tricuspid valve is normal in structure. Tricuspid valve regurgitation is trivial. Aortic Valve: The aortic valve was not well visualized. There is mild calcification of the aortic valve. Aortic valve regurgitation is not visualized. No aortic stenosis is present. Pulmonic Valve: The pulmonic valve was not well visualized. Pulmonic valve regurgitation is trivial. Aorta: The aortic root is normal in size and structure. Venous: The inferior vena cava is dilated in size with greater than 50% respiratory variability, suggesting right atrial pressure of 8 mmHg. IAS/Shunts: No atrial level shunt detected by color flow Doppler.  LEFT VENTRICLE PLAX 2D LVIDd:         4.90 cm LVIDs:         3.60 cm LV PW:          0.80 cm LV IVS:        0.80 cm LVOT diam:     2.00 cm LV SV:         46 LV SV Index:   19 LVOT Area:     3.14 cm  LV Volumes (MOD) LV vol d, MOD A2C: 86.4 ml LV vol d, MOD A4C: 87.4 ml LV vol s, MOD A2C: 35.4 ml LV vol s, MOD A4C: 38.8 ml LV SV MOD A2C:     51.0 ml LV SV MOD A4C:     87.4 ml LV SV MOD BP:      48.9 ml RIGHT VENTRICLE TAPSE (M-mode): 1.9 cm LEFT ATRIUM             Index        RIGHT ATRIUM           Index LA diam:        4.30 cm 1.82 cm/m   RA Area:     26.20 cm LA Vol (A2C):   68.5 ml 29.03 ml/m  RA Volume:   79.40 ml  33.65 ml/m LA Vol (A4C):   75.2 ml 31.87 ml/m LA Biplane Vol: 75.9 ml 32.17 ml/m  AORTIC VALVE LVOT Vmax:   92.37 cm/s LVOT Vmean:  60.400 cm/s LVOT VTI:    0.146 m  AORTA Ao Root diam: 3.30 cm TRICUSPID VALVE TR Peak grad:   7.7 mmHg TR Vmax:        139.00 cm/s  SHUNTS  Systemic VTI:  0.15 m Systemic Diam: 2.00 cm Dalton McleanMD Electronically signed by Franki Monte Signature Date/Time: 09/06/2022/2:21:19 PM    Final    CT Renal Stone Study  Result Date: 09/05/2022 CLINICAL DATA:  History of UTI 1 month ago with persistent weakness. EXAM: CT ABDOMEN AND PELVIS WITHOUT CONTRAST TECHNIQUE: Multidetector CT imaging of the abdomen and pelvis was performed following the standard protocol without IV contrast. RADIATION DOSE REDUCTION: This exam was performed according to the departmental dose-optimization program which includes automated exposure control, adjustment of the mA and/or kV according to patient size and/or use of iterative reconstruction technique. COMPARISON:  July 21, 2022 FINDINGS: Lower chest: Mild linear scarring and/or atelectasis is seen within the right lung base. Hepatobiliary: No focal liver abnormality is seen. No gallstones, gallbladder wall thickening, or biliary dilatation. Pancreas: Unremarkable. No pancreatic ductal dilatation or surrounding inflammatory changes. Spleen: Normal in size without focal abnormality. Adrenals/Urinary Tract: A stable  2.0 cm low-attenuation right adrenal adenoma is noted (approximately 4.44 Hounsfield units). The left adrenal gland is unremarkable. Kidneys are normal in size, without obstructing renal calculi or hydronephrosis. A 3 mm nonobstructing renal calculus is seen within the mid to lower left kidney. A stable 2.2 cm cyst is seen within the posterior aspect of the mid left kidney. The urinary bladder is poorly distended and subsequently limited in evaluation. Bladder is also limited in evaluation secondary to overlying streak artifact. Mild to moderate severity asymmetric lateral urinary bladder wall thickening is seen, right greater than left. A 2.4 cm x 1.3 cm anterior urinary bladder diverticulum is also seen on the right. Stomach/Bowel: Stomach is within normal limits. The appendix is surgically absent. No evidence of bowel wall thickening, distention, or inflammatory changes. Vascular/Lymphatic: Aortic atherosclerosis. No enlarged abdominal or pelvic lymph nodes. Reproductive: Prostate is unremarkable. Other: A 3.8 cm x 3.5 cm fat containing umbilical hernia is noted. No abdominopelvic ascites. Musculoskeletal: A severe, chronic compression fracture deformity is seen at the level of L1 with stable, moderate severity retropulsion and kyphosis. IMPRESSION: 1. 3 mm nonobstructing left renal calculus. 2. Stable right adrenal adenoma. No follow-up imaging is recommended. This recommendation follows ACR consensus guidelines: Management of Incidental Adrenal Masses: A White Paper of the ACR Incidental Findings Committee. J Am Coll Radiol 2017;14:1038-1044. 3. Stable left renal cyst. No follow-up imaging is recommended. This recommendation follows ACR consensus guidelines: Management of the Incidental Renal Mass on CT: A White Paper of the ACR Incidental Findings Committee. J Am Coll Radiol [phone]. 4. Stable urinary bladder diverticulum with asymmetric, lateral urinary bladder wall thickening, as described above.  While this may represent sequelae associated with cystitis, sequelae associated with an underlying neoplasm cannot be excluded. Urology consult and follow-up cystoscopy is recommended. 5. Stable severe, chronic compression fracture deformity at the level of L1 with stable, moderate severity retropulsion and kyphosis. 6. Total right hip replacement with associated streak artifact. 7. Aortic atherosclerosis. Aortic Atherosclerosis (ICD10-I70.0). Electronically Signed   By: Virgina Norfolk M.D.   On: 09/05/2022 20:36   DG Chest Port 1 View  Result Date: 09/05/2022 CLINICAL DATA:  Possible sepsis. EXAM: PORTABLE CHEST 1 VIEW COMPARISON:  07/20/2022 and prior radiographs FINDINGS: RIGHT IJ Port-A-Cath again noted with tip overlying the mid SVC. The cardiomediastinal silhouette is unremarkable. There is no evidence of focal airspace disease, pulmonary edema, suspicious pulmonary nodule/mass, pleural effusion, or pneumothorax. No acute bony abnormalities are identified. IMPRESSION: No active disease. Electronically Signed   By: Cleatis Polka.D.  On: 09/05/2022 18:01   CT Head Wo Contrast  Result Date: 09/05/2022 CLINICAL DATA:  76 year old male with fall and head injury. EXAM: CT HEAD WITHOUT CONTRAST TECHNIQUE: Contiguous axial images were obtained from the base of the skull through the vertex without intravenous contrast. RADIATION DOSE REDUCTION: This exam was performed according to the departmental dose-optimization program which includes automated exposure control, adjustment of the mA and/or kV according to patient size and/or use of iterative reconstruction technique. COMPARISON:  07/20/2022 head CT and prior studies FINDINGS: Brain: No evidence of acute infarction, hemorrhage, hydrocephalus, extra-axial collection or mass lesion/mass effect. Atrophy and chronic small-vessel white matter ischemic changes again noted. Vascular: Carotid and vertebral atherosclerotic calcifications are noted. Skull:  Normal. Negative for fracture or focal lesion. Sinuses/Orbits: RIGHT mastoid effusion is noted. The middle ears and inner ears are clear. The paranasal sinuses and LEFT mastoid air cells are clear. Other: None IMPRESSION: 1. No evidence of acute intracranial abnormality. 2. Atrophy and chronic small-vessel white matter ischemic changes. 3. RIGHT mastoid effusion. Electronically Signed   By: Margarette Canada M.D.   On: 09/05/2022 17:49    Microbiology: Results for orders placed or performed during the hospital encounter of 09/05/22  Blood Culture (routine x 2)     Status: None (Preliminary result)   Collection Time: 09/05/22  5:55 PM   Specimen: BLOOD RIGHT ARM  Result Value Ref Range Status   Specimen Description   Final    BLOOD RIGHT ARM Performed at [facility], [address], [place], [place] [address]    Special Requests   Final    BOTTLES DRAWN AEROBIC AND ANAEROBIC Blood Culture adequate volume Performed at [facility] [address] [place], [place] [address]    Culture   Final    NO GROWTH 2 DAYS Performed at [facility] [address] [place],  [address]    Report Status PENDING  Incomplete  Resp Panel by RT-PCR (Flu A&B, Covid) Anterior Nasal Swab     Status: None   Collection Time: 09/05/22  6:54 PM   Specimen: Anterior Nasal Swab  Result Value Ref Range Status   SARS Coronavirus 2 by RT PCR NEGATIVE NEGATIVE Final    Comment: (NOTE) SARS-CoV-2 target nucleic acids are NOT DETECTED.  The SARS-CoV-2 RNA is generally detectable in upper respiratory specimens during the acute phase of infection. The lowest concentration of SARS-CoV-2 viral copies this assay can detect is 138 copies/mL. A negative result does not preclude SARS-Cov-2 infection and should not be used as the sole basis for treatment or other patient management decisions. A negative result may occur with  improper specimen collection/handling, submission of specimen  other than nasopharyngeal swab, presence of viral mutation(s) within the areas targeted by this assay, and inadequate number of viral copies(<138 copies/mL). A negative result must be combined with clinical observations, patient history, and epidemiological information. The expected result is Negative.  Fact Sheet for Patients:  EntrepreneurPulse.com.au  Fact Sheet for Healthcare Providers:  IncredibleEmployment.be  This test is no t yet approved or cleared by the [place] FDA and  has been authorized for detection and/or diagnosis of SARS-CoV-2 by FDA under an Emergency Use Authorization (EUA). This EUA will remain  in effect (meaning this test can be used) for the duration of the COVID-19 declaration under Section 564(b)(1) of the Act, 21 U.S.C.section 360bbb-3(b)(1), unless the authorization is terminated  or revoked sooner.       Influenza A  by PCR NEGATIVE NEGATIVE Final   Influenza B by PCR NEGATIVE NEGATIVE Final    Comment: (NOTE) The Xpert Xpress SARS-CoV-2/FLU/RSV plus assay is intended as an aid in the diagnosis of influenza from Nasopharyngeal swab specimens and should not be used as a sole basis for treatment. Nasal washings and aspirates are unacceptable for Xpert Xpress SARS-CoV-2/FLU/RSV testing.  Fact Sheet for Patients: EntrepreneurPulse.com.au  Fact Sheet for Healthcare Providers: IncredibleEmployment.be  This test is not yet approved or cleared by the [place] FDA and has been authorized for detection and/or diagnosis of SARS-CoV-2 by FDA under an Emergency Use Authorization (EUA). This EUA will remain in effect (meaning this test can be used) for the duration of the COVID-19 declaration under Section 564(b)(1) of the Act, 21 U.S.C. section 360bbb-3(b)(1), unless the authorization is terminated or revoked.  Performed at [facility], [place] [address] [place], [place] [address]   Blood Culture (routine x 2)     Status: None (Preliminary result)   Collection Time: 09/05/22  7:53 PM   Specimen: BLOOD RIGHT FOREARM  Result Value Ref Range Status   Specimen Description   Final    BLOOD RIGHT FOREARM Performed at [facility], [place] [address] [place], [place] [address]    Special Requests   Final    BOTTLES DRAWN AEROBIC AND ANAEROBIC Blood Culture adequate volume Performed at [facility] [address] [place], [place] [address]    Culture   Final    NO GROWTH 2 DAYS Performed at [facility] [address] [place] [address], [place], [place] [address]    Report Status PENDING  Incomplete  Urine Culture     Status: Abnormal   Collection Time: 09/05/22  8:55 PM   Specimen: In/Out Cath Urine  Result Value Ref Range Status   Specimen Description   Final    IN/OUT CATH URINE Performed at [facility] [address] [place], [place] [address]    Special Requests   Final    NONE Performed at [facility], [place] [address] [place], [place] [address]    Culture 20,000 COLONIES/mL PROTEUS MIRABILIS (A)  Final   Report Status 09/07/2022 FINAL  Final   Organism ID, Bacteria PROTEUS MIRABILIS (A)  Final      Susceptibility   Proteus mirabilis - MIC*    AMPICILLIN <=2 SENSITIVE Sensitive     CEFAZOLIN 8 SENSITIVE Sensitive     CEFEPIME <=0.12 SENSITIVE Sensitive     CEFTRIAXONE <=0.25 SENSITIVE Sensitive     CIPROFLOXACIN >=4 RESISTANT Resistant     GENTAMICIN >=16 RESISTANT Resistant     IMIPENEM 2 SENSITIVE Sensitive     NITROFURANTOIN RESISTANT Resistant     TRIMETH/SULFA >=320 RESISTANT Resistant     AMPICILLIN/SULBACTAM <=2 SENSITIVE Sensitive     PIP/TAZO <=4 SENSITIVE Sensitive     * 20,000 COLONIES/mL PROTEUS MIRABILIS    Labs: CBC: Recent Labs  Lab 09/05/22 1755 09/06/22 0056  WBC 9.5 7.0  NEUTROABS 7.9*  --   HGB 15.1 13.3  HCT 46.8 42.6  MCV 94.5 96.6   PLT 219 150*   Basic Metabolic Panel: Recent Labs  Lab 09/05/22 1755 09/06/22 0056 09/07/22 0604  NA 138 137 133*  K 4.6 3.8 4.3  CL 107 106 106  CO2 23 23 20*  GLUCOSE 101* 66* 198*  BUN 46* 39* 29*  CREATININE 1.78* 1.52* 1.12  CALCIUM 8.6* 8.0* 8.0*  MG  --  1.8  --   PHOS  --  2.7  --    Liver Function Tests: Recent Labs  Lab 09/05/22 1755 09/06/22 0056  AST 25 23  ALT 30 25  ALKPHOS 58 45  BILITOT 1.2 1.1  PROT 6.1* 4.9*  ALBUMIN 3.0* 2.3*   CBG: Recent Labs  Lab 09/06/22 0408  GLUCAP 136*    Discharge time spent: greater than 30 minutes.  Signed: Cristela Felt, MD [facility] 09/07/2022

## 2022-09-07 NOTE — Progress Notes (Signed)
Mobility Specialist - Progress Note   09/07/22 1006  Mobility  Activity Ambulated with assistance in hallway  Level of Assistance Standby assist, set-up cues, supervision of patient - no hands on  Assistive Device Front wheel walker  Distance Ambulated (ft) 160 ft  Activity Response Tolerated well  Mobility Referral Yes  $Mobility charge 1 Mobility   Pt received in bed and agreeable to mobility. Pt required several short standing breaks in hallway to assist in regaining balance. Pt to recliner after session with all needs met.     [facility]

## 2022-09-07 NOTE — TOC Progression Note (Signed)
Transition of Care [facility]) - Progression Note    Patient Details  Name: [name] MRN: [id number] Date of Birth: 04/30/1946  Transition of Care [facility]) CM/SW [place], RN Phone [phone]  09/07/2022, 3:56 PM  Clinical Narrative:    CM at bedside to offer choice for home health. Patient states that he is active with [facility].         Expected Discharge Plan and Services           Expected Discharge Date: 09/07/22                                     Social Determinants of Health (SDOH) Interventions    Readmission Risk Interventions    04/27/2022    9:56 AM 04/26/2022    6:28 PM 03/22/2022   10:07 AM  Readmission Risk Prevention Plan  Transportation Screening  Complete Complete  PCP or Specialist Appt within 3-5 Days   Complete  HRI or [place]   Complete  Social Work Consult for [place] Planning/Counseling   Complete  Palliative Care Screening   Complete  Medication Review Press photographer)  Referral to Pharmacy Complete  PCP or Specialist appointment within 3-5 days of discharge Complete    HRI or [place]  Complete   SW Recovery Care/Counseling Consult Complete    [place]  Not Applicable

## 2022-09-07 NOTE — Progress Notes (Signed)
Mobility Specialist - Progress Note   09/07/22 1326  Mobility  Activity Ambulated with assistance in hallway  Level of Assistance Standby assist, set-up cues, supervision of patient - no hands on  Assistive Device Front wheel walker  Distance Ambulated (ft) 180 ft  Activity Response Tolerated well  Mobility Referral Yes  $Mobility charge 1 Mobility   Pt received in recliner and agreeable to mobility. Pt a little more steady this afternoon compared to this morning. Pt to recliner after session with all needs met.      [facility]

## 2022-09-08 ENCOUNTER — Other Ambulatory Visit (HOSPITAL_COMMUNITY): Payer: Self-pay

## 2022-09-08 DIAGNOSIS — C8330 Diffuse large B-cell lymphoma, unspecified site: Secondary | ICD-10-CM | POA: Diagnosis not present

## 2022-09-08 DIAGNOSIS — Z5111 Encounter for antineoplastic chemotherapy: Secondary | ICD-10-CM | POA: Diagnosis not present

## 2022-09-10 LAB — CULTURE, BLOOD (ROUTINE X 2)
Culture: NO GROWTH
Culture: NO GROWTH
Special Requests: ADEQUATE
Special Requests: ADEQUATE

## 2022-09-14 ENCOUNTER — Telehealth: Payer: Self-pay | Admitting: Internal Medicine

## 2022-09-14 NOTE — Telephone Encounter (Signed)
April with [facility] called to REPORT a fall and to request an ORDER for treatment:   Pt fell Friday 09/10/22 while walking with his walker from his sitting room. The walker got caught on something and he fell. INJURY: skin tear on Right Great Toe.  Treatment Order requested:  XEROFORM twice weekly for skin tear right great toe.  April with [facility] [phone]

## 2022-09-15 ENCOUNTER — Inpatient Hospital Stay (HOSPITAL_COMMUNITY)
Admission: EM | Admit: 2022-09-15 | Discharge: 2022-09-23 | DRG: 683 | Disposition: A | Discharge status: Home Health | Payer: Medicare PPO | Attending: Family Medicine | Admitting: Family Medicine

## 2022-09-15 ENCOUNTER — Emergency Department (HOSPITAL_COMMUNITY): Payer: Medicare PPO

## 2022-09-15 ENCOUNTER — Encounter (HOSPITAL_COMMUNITY): Payer: Self-pay

## 2022-09-15 ENCOUNTER — Other Ambulatory Visit: Payer: Self-pay

## 2022-09-15 DIAGNOSIS — Z66 Do not resuscitate: Secondary | ICD-10-CM | POA: Diagnosis present

## 2022-09-15 DIAGNOSIS — K219 Gastro-esophageal reflux disease without esophagitis: Secondary | ICD-10-CM | POA: Diagnosis present

## 2022-09-15 DIAGNOSIS — L400 Psoriasis vulgaris: Secondary | ICD-10-CM | POA: Diagnosis present

## 2022-09-15 DIAGNOSIS — I2510 Atherosclerotic heart disease of native coronary artery without angina pectoris: Secondary | ICD-10-CM | POA: Diagnosis present

## 2022-09-15 DIAGNOSIS — Z8249 Family history of ischemic heart disease and other diseases of the circulatory system: Secondary | ICD-10-CM

## 2022-09-15 DIAGNOSIS — M6281 Muscle weakness (generalized): Secondary | ICD-10-CM | POA: Diagnosis not present

## 2022-09-15 DIAGNOSIS — C8190 Hodgkin lymphoma, unspecified, unspecified site: Secondary | ICD-10-CM | POA: Diagnosis present

## 2022-09-15 DIAGNOSIS — D89839 Cytokine release syndrome, grade unspecified: Secondary | ICD-10-CM | POA: Diagnosis not present

## 2022-09-15 DIAGNOSIS — Z96641 Presence of right artificial hip joint: Secondary | ICD-10-CM | POA: Diagnosis present

## 2022-09-15 DIAGNOSIS — F411 Generalized anxiety disorder: Secondary | ICD-10-CM | POA: Diagnosis present

## 2022-09-15 DIAGNOSIS — Z96653 Presence of artificial knee joint, bilateral: Secondary | ICD-10-CM | POA: Diagnosis present

## 2022-09-15 DIAGNOSIS — Z9841 Cataract extraction status, right eye: Secondary | ICD-10-CM

## 2022-09-15 DIAGNOSIS — R310 Gross hematuria: Secondary | ICD-10-CM | POA: Diagnosis not present

## 2022-09-15 DIAGNOSIS — N3001 Acute cystitis with hematuria: Secondary | ICD-10-CM

## 2022-09-15 DIAGNOSIS — Z79890 Hormone replacement therapy (postmenopausal): Secondary | ICD-10-CM

## 2022-09-15 DIAGNOSIS — Z87891 Personal history of nicotine dependence: Secondary | ICD-10-CM

## 2022-09-15 DIAGNOSIS — I5022 Chronic systolic (congestive) heart failure: Secondary | ICD-10-CM | POA: Diagnosis present

## 2022-09-15 DIAGNOSIS — I110 Hypertensive heart disease with heart failure: Secondary | ICD-10-CM | POA: Diagnosis present

## 2022-09-15 DIAGNOSIS — Z9049 Acquired absence of other specified parts of digestive tract: Secondary | ICD-10-CM

## 2022-09-15 DIAGNOSIS — R531 Weakness: Secondary | ICD-10-CM | POA: Diagnosis not present

## 2022-09-15 DIAGNOSIS — W19XXXA Unspecified fall, initial encounter: Secondary | ICD-10-CM | POA: Diagnosis not present

## 2022-09-15 DIAGNOSIS — Z87442 Personal history of urinary calculi: Secondary | ICD-10-CM | POA: Diagnosis not present

## 2022-09-15 DIAGNOSIS — D62 Acute posthemorrhagic anemia: Secondary | ICD-10-CM | POA: Diagnosis present

## 2022-09-15 DIAGNOSIS — I959 Hypotension, unspecified: Secondary | ICD-10-CM | POA: Diagnosis present

## 2022-09-15 DIAGNOSIS — I4819 Other persistent atrial fibrillation: Secondary | ICD-10-CM | POA: Diagnosis present

## 2022-09-15 DIAGNOSIS — M810 Age-related osteoporosis without current pathological fracture: Secondary | ICD-10-CM | POA: Diagnosis present

## 2022-09-15 DIAGNOSIS — J449 Chronic obstructive pulmonary disease, unspecified: Secondary | ICD-10-CM | POA: Diagnosis present

## 2022-09-15 DIAGNOSIS — R000 Tachycardia, unspecified: Secondary | ICD-10-CM | POA: Diagnosis not present

## 2022-09-15 DIAGNOSIS — N281 Cyst of kidney, acquired: Secondary | ICD-10-CM | POA: Diagnosis present

## 2022-09-15 DIAGNOSIS — D696 Thrombocytopenia, unspecified: Secondary | ICD-10-CM | POA: Diagnosis present

## 2022-09-15 DIAGNOSIS — I5042 Chronic combined systolic (congestive) and diastolic (congestive) heart failure: Secondary | ICD-10-CM | POA: Diagnosis present

## 2022-09-15 DIAGNOSIS — Z87440 Personal history of urinary (tract) infections: Secondary | ICD-10-CM

## 2022-09-15 DIAGNOSIS — N390 Urinary tract infection, site not specified: Secondary | ICD-10-CM | POA: Diagnosis present

## 2022-09-15 DIAGNOSIS — E785 Hyperlipidemia, unspecified: Secondary | ICD-10-CM | POA: Diagnosis present

## 2022-09-15 DIAGNOSIS — Z961 Presence of intraocular lens: Secondary | ICD-10-CM | POA: Diagnosis present

## 2022-09-15 DIAGNOSIS — E039 Hypothyroidism, unspecified: Secondary | ICD-10-CM | POA: Diagnosis present

## 2022-09-15 DIAGNOSIS — G4733 Obstructive sleep apnea (adult) (pediatric): Secondary | ICD-10-CM | POA: Diagnosis present

## 2022-09-15 DIAGNOSIS — E890 Postprocedural hypothyroidism: Secondary | ICD-10-CM | POA: Diagnosis present

## 2022-09-15 DIAGNOSIS — D3501 Benign neoplasm of right adrenal gland: Secondary | ICD-10-CM | POA: Diagnosis present

## 2022-09-15 DIAGNOSIS — Z20822 Contact with and (suspected) exposure to covid-19: Secondary | ICD-10-CM | POA: Diagnosis present

## 2022-09-15 DIAGNOSIS — F1090 Alcohol use, unspecified, uncomplicated: Secondary | ICD-10-CM | POA: Diagnosis not present

## 2022-09-15 DIAGNOSIS — I480 Paroxysmal atrial fibrillation: Secondary | ICD-10-CM | POA: Diagnosis not present

## 2022-09-15 DIAGNOSIS — N400 Enlarged prostate without lower urinary tract symptoms: Secondary | ICD-10-CM | POA: Diagnosis present

## 2022-09-15 DIAGNOSIS — R5383 Other fatigue: Secondary | ICD-10-CM | POA: Diagnosis not present

## 2022-09-15 DIAGNOSIS — R41841 Cognitive communication deficit: Secondary | ICD-10-CM | POA: Diagnosis not present

## 2022-09-15 DIAGNOSIS — Z7901 Long term (current) use of anticoagulants: Secondary | ICD-10-CM | POA: Diagnosis present

## 2022-09-15 DIAGNOSIS — R296 Repeated falls: Secondary | ICD-10-CM | POA: Diagnosis present

## 2022-09-15 DIAGNOSIS — N179 Acute kidney failure, unspecified: Principal | ICD-10-CM | POA: Diagnosis present

## 2022-09-15 DIAGNOSIS — Z825 Family history of asthma and other chronic lower respiratory diseases: Secondary | ICD-10-CM

## 2022-09-15 DIAGNOSIS — C8338 Diffuse large B-cell lymphoma, lymph nodes of multiple sites: Secondary | ICD-10-CM | POA: Diagnosis not present

## 2022-09-15 DIAGNOSIS — Z79899 Other long term (current) drug therapy: Secondary | ICD-10-CM

## 2022-09-15 DIAGNOSIS — R300 Dysuria: Secondary | ICD-10-CM | POA: Diagnosis not present

## 2022-09-15 DIAGNOSIS — R42 Dizziness and giddiness: Secondary | ICD-10-CM | POA: Diagnosis not present

## 2022-09-15 DIAGNOSIS — N323 Diverticulum of bladder: Secondary | ICD-10-CM | POA: Diagnosis present

## 2022-09-15 DIAGNOSIS — J302 Other seasonal allergic rhinitis: Secondary | ICD-10-CM | POA: Diagnosis present

## 2022-09-15 DIAGNOSIS — R001 Bradycardia, unspecified: Secondary | ICD-10-CM | POA: Diagnosis not present

## 2022-09-15 DIAGNOSIS — R319 Hematuria, unspecified: Secondary | ICD-10-CM | POA: Diagnosis not present

## 2022-09-15 DIAGNOSIS — I10 Essential (primary) hypertension: Secondary | ICD-10-CM | POA: Diagnosis not present

## 2022-09-15 DIAGNOSIS — Z974 Presence of external hearing-aid: Secondary | ICD-10-CM

## 2022-09-15 DIAGNOSIS — R54 Age-related physical debility: Secondary | ICD-10-CM | POA: Diagnosis present

## 2022-09-15 DIAGNOSIS — Z7952 Long term (current) use of systemic steroids: Secondary | ICD-10-CM | POA: Diagnosis not present

## 2022-09-15 DIAGNOSIS — C8330 Diffuse large B-cell lymphoma, unspecified site: Secondary | ICD-10-CM | POA: Diagnosis present

## 2022-09-15 DIAGNOSIS — I4891 Unspecified atrial fibrillation: Secondary | ICD-10-CM | POA: Diagnosis not present

## 2022-09-15 DIAGNOSIS — Z9842 Cataract extraction status, left eye: Secondary | ICD-10-CM

## 2022-09-15 DIAGNOSIS — N200 Calculus of kidney: Secondary | ICD-10-CM | POA: Diagnosis present

## 2022-09-15 DIAGNOSIS — R2689 Other abnormalities of gait and mobility: Secondary | ICD-10-CM | POA: Diagnosis not present

## 2022-09-15 DIAGNOSIS — C8510 Unspecified B-cell lymphoma, unspecified site: Secondary | ICD-10-CM | POA: Diagnosis not present

## 2022-09-15 MED ORDER — SODIUM CHLORIDE 0.9 % IV BOLUS
1000.0000 mL | Freq: Once | INTRAVENOUS | Status: AC
  Administered 2022-09-16: 1000 mL via INTRAVENOUS

## 2022-09-15 NOTE — ED Provider Notes (Signed)
[facility] EMERGENCY DEPARTMENT Provider Note   CSN: [id number] Arrival date & time: 09/15/22  2207     History  Chief Complaint  Patient presents with   Weakness    BIB from home by EMS for weakness after cancer tx, patient is frequently here for same, pt has hodgkins lymphoma   Fall    Patient has had multiple falls recently, 3 falls today, normally uses a walker to ambulate, on xarelto, no injuries from falls    [name] is a 76 y.o. male.  With a history of recurrent UTIs, Hodgkin's lymphoma currently undergoing chemotherapy A-fib on Xarelto, CHF, CAD who presents to the ED for evaluation of weakness.  States that he had his chemotherapy session at [facility] all day today.  When he got home he noticed he felt weak with walking.  States his knees gave out 3 times when he fell to his knees or buttocks each time.  Never hit his head or lose consciousness.  Does not report any pain at this time.  States he occasionally gets weak after chemotherapy, but typically does not fall 3 times in 1 day.  Also states he has had intermittent UTI symptoms including dysuria and hematuria for the past month.  Has not been able to see urology due to wait times in the past 2 weeks.  States symptoms go away with treatment but come right back.  Denies dizziness, lightheadedness, headaches, chest pain, shortness of breath, nausea, vomiting, fevers, chills.      Weakness Associated symptoms: dysuria   Fall       Home Medications Prior to Admission medications   Medication Sig Start Date End Date Taking? Authorizing Provider  acetaminophen (TYLENOL) 325 MG tablet Take 2 tablets (650 mg total) by mouth every 6 (six) hours as needed for mild pain (or Fever >/= 101). 03/25/22   Raiford Noble Latif, DO  acyclovir (ZOVIRAX) 400 MG tablet Take 400 mg by mouth 2 (two) times daily. 08/11/22   [provider]  albuterol (VENTOLIN HFA) 108 (90 Base) MCG/ACT inhaler Inhale 2  puffs into the lungs every 6 (six) hours as needed for wheezing or shortness of breath.    [provider]  alfuzosin (UROXATRAL) 10 MG 24 hr tablet TAKE 1 TABLET AT BEDTIME 08/10/22   McKenzie, Candee Furbish, MD  allopurinol (ZYLOPRIM) 100 MG tablet Take 1 tablet (100 mg total) by mouth 2 (two) times daily. Patient not taking: Reported on 09/05/2022 06/30/22   Tommy Medal, Lavell Islam, MD  allopurinol (ZYLOPRIM) 300 MG tablet Take 300 mg by mouth daily. 08/11/22 09/10/22  [provider]  atorvastatin (LIPITOR) 80 MG tablet TAKE 1 TABLET EVERY DAY 08/09/22   Loel Dubonnet, NP  buPROPion (WELLBUTRIN XL) 300 MG 24 hr tablet TAKE 1 TABLET EVERY DAY WITH BREAKFAST Patient taking differently: Take 300 mg by mouth daily. 07/19/22   Plotnikov, Evie Lacks, MD  Calcium Citrate-Vitamin D (CALCIUM CITRATE + D PO) Take 1 tablet by mouth 2 (two) times daily. '1200mg'$  of Calcium and 1000 units vitamin D3    [provider]  cephALEXin (KEFLEX) 500 MG capsule Take 1 capsule (500 mg total) by mouth 4 (four) times daily for 7 days. 09/07/22 09/15/22  Dibia, [name], MD  Cholecalciferol (VITAMIN D) 50 MCG (2000 UT) tablet Take 4,000 Units by mouth daily.    [provider]  clonazePAM (KLONOPIN) 1 MG tablet Take 1 tablet (1 mg total) by mouth 3 (three)  times daily as needed for anxiety. 08/23/22   Plotnikov, Evie Lacks, MD  CRANBERRY PO Take 1 tablet by mouth in the morning and at bedtime.    [provider]  denosumab (PROLIA) 60 MG/ML SOSY injection Inject 60 mg as directed every 6 (six) months. 12/10/15   [provider]  DULoxetine (CYMBALTA) 60 MG capsule TAKE 1 CAPSULE TWICE DAILY Patient taking differently: Take 60 mg by mouth 2 (two) times daily. 07/19/22   Plotnikov, Evie Lacks, MD  EPINEPHrine 0.3 mg/0.3 mL IJ SOAJ injection Inject 0.3 mg into the muscle once as needed for anaphylaxis (severe allergic reaction). 07/31/21   [provider]  feeding supplement  (ENSURE ENLIVE / ENSURE PLUS) LIQD Take 237 mLs by mouth 2 (two) times daily between meals. Patient taking differently: Take 237 mLs by mouth daily. 03/25/22   Raiford Noble Latif, DO  ferrous sulfate 325 (65 FE) MG tablet Take 325 mg by mouth 2 (two) times daily with a meal.    [provider]  HYDROcodone-acetaminophen (NORCO) 7.5-325 MG tablet Take 1 tablet by mouth every 6 (six) hours as needed for severe pain. Patient not taking: Reported on 09/05/2022 03/26/22   Raiford Noble Latif, DO  levothyroxine (SYNTHROID) 125 MCG tablet Take 1 tablet (125 mcg total) by mouth daily at 6 (six) AM. 08/23/22   Plotnikov, Evie Lacks, MD  lidocaine-prilocaine (EMLA) cream Apply 1 Application topically as needed. Patient taking differently: Apply 1 Application topically as needed (For port access/chemotherapy). 04/21/22   Brunetta Genera, MD  loratadine (CLARITIN) 10 MG tablet Take 10 mg by mouth daily.    [provider]  Magnesium 250 MG TABS Take 1 tablet by mouth daily.    [provider]  Magnesium 500 MG CAPS Take 1 capsule (500 mg total) by mouth daily. Patient taking differently: Take 500 mg by mouth daily with supper. 10/13/18   Brunetta Genera, MD  metoprolol tartrate (LOPRESSOR) 25 MG tablet Take 25 mg by mouth 2 (two) times daily.    [provider]  Multiple Vitamin (MULTIVITAMIN WITH MINERALS) TABS tablet Take 1 tablet by mouth every morning. Men's One-A-Day 50+    [provider]  MYRBETRIQ 25 MG TB24 tablet TAKE 1 TABLET EVERY DAY Patient taking differently: Take 25 mg by mouth at bedtime. 10/06/21   McKenzie, Candee Furbish, MD  nitroGLYCERIN (NITROSTAT) 0.4 MG SL tablet Place 0.4 mg under the tongue every 5 (five) minutes as needed for chest pain.    [provider]  omeprazole (PRILOSEC) 40 MG capsule TAKE 1 CAPSULE EVERY DAY BEFORE BREAKFAST Patient taking differently: Take 40 mg by mouth daily. 07/19/22   Plotnikov, Evie Lacks, MD   Potassium Citrate 15 MEQ (1620 MG) TBCR Take 1 tablet by mouth See admin instructions. Take one tablet (15 meq) by mouth twice daily - with lunch and supper Patient taking differently: Take 1 tablet by mouth in the morning and at bedtime. 06/10/22   McKenzie, Candee Furbish, MD  Probiotic Product (PROBIOTIC PO) Take 1 capsule by mouth daily with breakfast.    [provider]  Risankizumab-rzaa (SKYRIZI, 150 MG DOSE, [place]) Inject 150 mg into the skin every 4 (four) months.    [provider]  rivaroxaban (XARELTO) 20 MG TABS tablet Take 1 tablet (20 mg total) by mouth daily with supper. 05/26/22   Loel Dubonnet, NP  testosterone enanthate (DELATESTRYL) 200 MG/ML injection INJECT 1ML ('200MG'$ ) INTO THE MUSCLE EVERY 14 DAYS. DISCARD VIAL AFTER  28 DAYS. Patient taking differently: Inject 200 mg into the muscle every 14 (fourteen) days. Every other Friday 09/22/21   Plotnikov, Evie Lacks, MD  vitamin C (ASCORBIC ACID) 500 MG tablet Take 500 mg by mouth daily with lunch.    [provider]      Allergies    Short ragweed pollen ext    Review of Systems   Review of Systems  Genitourinary:  Positive for dysuria.  Neurological:  Positive for weakness.  All other systems reviewed and are negative.   Physical Exam Updated Vital Signs BP (!) 95/50 (BP Location: Left Arm)   Pulse 77   Temp 97.6 F (36.4 C) (Oral)   Resp 18   Ht '6\' 4"'$  (1.93 m)   Wt 103.9 kg   SpO2 97%   BMI 27.87 kg/m  Physical Exam Vitals and nursing note reviewed.  Constitutional:      General: He is not in acute distress.    Appearance: Normal appearance. He is well-developed and normal weight. He is not ill-appearing, toxic-appearing or diaphoretic.  HENT:     Head: Normocephalic and atraumatic.     Mouth/Throat:     Comments: Poor dentition Eyes:     Extraocular Movements: Extraocular movements intact.     Conjunctiva/sclera: Conjunctivae normal.     Pupils: Pupils are equal, round, and  reactive to light.  Cardiovascular:     Rate and Rhythm: Normal rate and regular rhythm.     Pulses: Normal pulses.     Heart sounds: No murmur heard. Pulmonary:     Effort: Pulmonary effort is normal. No respiratory distress.     Breath sounds: Normal breath sounds. No stridor. No wheezing, rhonchi or rales.  Abdominal:     Palpations: Abdomen is soft.     Tenderness: There is no abdominal tenderness. There is no guarding or rebound.  Musculoskeletal:        General: No swelling.     Cervical back: Neck supple.     Right lower leg: No edema.     Left lower leg: No edema.  Skin:    General: Skin is warm and dry.     Capillary Refill: Capillary refill takes less than 2 seconds.  Neurological:     General: No focal deficit present.     Mental Status: He is alert and oriented to person, place, and time.  Psychiatric:        Mood and Affect: Mood normal.     ED Results / Procedures / Treatments   Labs (all labs ordered are listed, but only abnormal results are displayed) Labs Reviewed  COMPREHENSIVE METABOLIC PANEL  CBC WITH DIFFERENTIAL/PLATELET  URINALYSIS, ROUTINE W REFLEX MICROSCOPIC    EKG None  Radiology No results found.  Procedures Procedures    Medications Ordered in ED Medications  sodium chloride 0.9 % bolus 1,000 mL (has no administration in time range)    ED Course/ Medical Decision Making/ A&P                           Medical Decision Making Amount and/or Complexity of Data Reviewed Labs: ordered. Radiology: ordered.  This patient presents to the ED for concern of weakness, this involves an extensive number of treatment options, and is a complaint that carries with it a high risk of complications and morbidity.  The differential diagnosis of weakness includes but is not limited to neurologic causes (GBS, myasthenia gravis, CVA, MS,  ALS, transverse myelitis, spinal cord injury, CVA, botulism, ) and other causes: ACS, Arrhythmia, syncope,  orthostatic hypotension, sepsis, hypoglycemia, electrolyte disturbance, hypothyroidism, respiratory failure, symptomatic anemia, dehydration, heat injury, polypharmacy, malignancy.    Co morbidities that complicate the patient evaluation  ecurrent UTIs, Hodgkin's lymphoma currently undergoing chemotherapy A-fib on Xarelto, CHF, CAD  My initial workup includes basic labs, chest x ray, EKG, IV fluids  Additional history obtained from: Nursing notes from this visit. EMS provides a portion of the history  I ordered, reviewed and interpreted labs which include: CBC, CMP, urinalysis.  Labs pending at the time of change   I ordered imaging studies including chest x-ray I independently visualized and interpreted imaging which showed pending at the time of shift change I agree with the radiologist interpretation  Afebrile, initially hypotensive at 35/33.  76 year old male presenting to the ED for evaluation of weakness following chemotherapy treatment.  States he occasionally gets weak after these treatments.  Had 3 falls from standing position today.  Denies pain.  Believes he has a UTI which is causing his weakness.  Physical exam with patient resting in bed is unremarkable.  Labs and imaging and EKG pending at the time of shift change.  IV fluids were ordered to correct patient's blood pressure.  Patient was waiting for IV team at the time of shift change.  Current plan is pending labs and imaging.  I suspect patient will feel better after IV fluids.  Anticipate discharge home with primary care follow-up.  Care will be handed off to [facility], PA-C pending work-up and interventions.  Please see her note for final disposition.  Plan can be changed at the discretion of the oncoming provider.  Note: Portions of this report may have been transcribed using voice recognition software. Every effort was made to ensure accuracy; however, inadvertent computerized transcription errors may still be present.           Final Clinical Impression(s) / ED Diagnoses Final diagnoses:  None    Rx / DC Orders ED Discharge Orders     None         Nehemiah Massed 09/16/22 0000    Carmin Muskrat, MD 09/16/22 2139

## 2022-09-16 ENCOUNTER — Telehealth: Payer: Self-pay | Admitting: Infectious Disease

## 2022-09-16 ENCOUNTER — Encounter (HOSPITAL_COMMUNITY): Payer: Self-pay | Admitting: Family Medicine

## 2022-09-16 ENCOUNTER — Ambulatory Visit: Payer: Medicare PPO | Admitting: Internal Medicine

## 2022-09-16 ENCOUNTER — Observation Stay (HOSPITAL_COMMUNITY): Payer: Medicare PPO

## 2022-09-16 DIAGNOSIS — J449 Chronic obstructive pulmonary disease, unspecified: Secondary | ICD-10-CM

## 2022-09-16 DIAGNOSIS — I959 Hypotension, unspecified: Secondary | ICD-10-CM

## 2022-09-16 DIAGNOSIS — Z7901 Long term (current) use of anticoagulants: Secondary | ICD-10-CM | POA: Diagnosis present

## 2022-09-16 DIAGNOSIS — G4733 Obstructive sleep apnea (adult) (pediatric): Secondary | ICD-10-CM

## 2022-09-16 DIAGNOSIS — J302 Other seasonal allergic rhinitis: Secondary | ICD-10-CM | POA: Diagnosis present

## 2022-09-16 DIAGNOSIS — D89839 Cytokine release syndrome, grade unspecified: Secondary | ICD-10-CM | POA: Diagnosis not present

## 2022-09-16 DIAGNOSIS — Z96653 Presence of artificial knee joint, bilateral: Secondary | ICD-10-CM | POA: Diagnosis present

## 2022-09-16 DIAGNOSIS — I5022 Chronic systolic (congestive) heart failure: Secondary | ICD-10-CM

## 2022-09-16 DIAGNOSIS — C8338 Diffuse large B-cell lymphoma, lymph nodes of multiple sites: Secondary | ICD-10-CM | POA: Diagnosis not present

## 2022-09-16 DIAGNOSIS — R310 Gross hematuria: Secondary | ICD-10-CM | POA: Diagnosis not present

## 2022-09-16 DIAGNOSIS — C8330 Diffuse large B-cell lymphoma, unspecified site: Secondary | ICD-10-CM | POA: Diagnosis present

## 2022-09-16 DIAGNOSIS — C8190 Hodgkin lymphoma, unspecified, unspecified site: Secondary | ICD-10-CM | POA: Diagnosis present

## 2022-09-16 DIAGNOSIS — I4819 Other persistent atrial fibrillation: Secondary | ICD-10-CM | POA: Diagnosis present

## 2022-09-16 DIAGNOSIS — I110 Hypertensive heart disease with heart failure: Secondary | ICD-10-CM | POA: Diagnosis present

## 2022-09-16 DIAGNOSIS — N3001 Acute cystitis with hematuria: Secondary | ICD-10-CM | POA: Diagnosis not present

## 2022-09-16 DIAGNOSIS — I480 Paroxysmal atrial fibrillation: Secondary | ICD-10-CM | POA: Diagnosis not present

## 2022-09-16 DIAGNOSIS — I2510 Atherosclerotic heart disease of native coronary artery without angina pectoris: Secondary | ICD-10-CM | POA: Diagnosis present

## 2022-09-16 DIAGNOSIS — R296 Repeated falls: Secondary | ICD-10-CM

## 2022-09-16 DIAGNOSIS — Z66 Do not resuscitate: Secondary | ICD-10-CM | POA: Diagnosis present

## 2022-09-16 DIAGNOSIS — E785 Hyperlipidemia, unspecified: Secondary | ICD-10-CM | POA: Diagnosis present

## 2022-09-16 DIAGNOSIS — N390 Urinary tract infection, site not specified: Secondary | ICD-10-CM

## 2022-09-16 DIAGNOSIS — E890 Postprocedural hypothyroidism: Secondary | ICD-10-CM | POA: Diagnosis present

## 2022-09-16 DIAGNOSIS — Z20822 Contact with and (suspected) exposure to covid-19: Secondary | ICD-10-CM | POA: Diagnosis present

## 2022-09-16 DIAGNOSIS — I5042 Chronic combined systolic (congestive) and diastolic (congestive) heart failure: Secondary | ICD-10-CM | POA: Diagnosis present

## 2022-09-16 DIAGNOSIS — D696 Thrombocytopenia, unspecified: Secondary | ICD-10-CM | POA: Diagnosis present

## 2022-09-16 DIAGNOSIS — N179 Acute kidney failure, unspecified: Secondary | ICD-10-CM

## 2022-09-16 DIAGNOSIS — F411 Generalized anxiety disorder: Secondary | ICD-10-CM | POA: Diagnosis present

## 2022-09-16 DIAGNOSIS — D62 Acute posthemorrhagic anemia: Secondary | ICD-10-CM | POA: Diagnosis present

## 2022-09-16 DIAGNOSIS — K219 Gastro-esophageal reflux disease without esophagitis: Secondary | ICD-10-CM | POA: Diagnosis present

## 2022-09-16 DIAGNOSIS — Z96641 Presence of right artificial hip joint: Secondary | ICD-10-CM | POA: Diagnosis present

## 2022-09-16 DIAGNOSIS — M810 Age-related osteoporosis without current pathological fracture: Secondary | ICD-10-CM | POA: Diagnosis present

## 2022-09-16 DIAGNOSIS — N400 Enlarged prostate without lower urinary tract symptoms: Secondary | ICD-10-CM | POA: Diagnosis present

## 2022-09-16 LAB — CBC WITH DIFFERENTIAL/PLATELET
Abs Immature Granulocytes: 0.22 10*3/uL — ABNORMAL HIGH (ref 0.00–0.07)
Basophils Absolute: 0 10*3/uL (ref 0.0–0.1)
Basophils Relative: 0 %
Eosinophils Absolute: 0.1 10*3/uL (ref 0.0–0.5)
Eosinophils Relative: 1 %
HCT: 37.4 % — ABNORMAL LOW (ref 39.0–52.0)
Hemoglobin: 12.4 g/dL — ABNORMAL LOW (ref 13.0–17.0)
Immature Granulocytes: 3 %
Lymphocytes Relative: 17 %
Lymphs Abs: 1.3 10*3/uL (ref 0.7–4.0)
MCH: 31.2 pg (ref 26.0–34.0)
MCHC: 33.2 g/dL (ref 30.0–36.0)
MCV: 94.2 fL (ref 80.0–100.0)
Monocytes Absolute: 0.5 10*3/uL (ref 0.1–1.0)
Monocytes Relative: 6 %
Neutro Abs: 5.4 10*3/uL (ref 1.7–7.7)
Neutrophils Relative %: 73 %
Platelets: 145 10*3/uL — ABNORMAL LOW (ref 150–400)
RBC: 3.97 MIL/uL — ABNORMAL LOW (ref 4.22–5.81)
RDW: 20.4 % — ABNORMAL HIGH (ref 11.5–15.5)
WBC: 7.5 10*3/uL (ref 4.0–10.5)
nRBC: 0 % (ref 0.0–0.2)

## 2022-09-16 LAB — MAGNESIUM: Magnesium: 1.9 mg/dL (ref 1.7–2.4)

## 2022-09-16 LAB — CBC
HCT: 36.8 % — ABNORMAL LOW (ref 39.0–52.0)
Hemoglobin: 11.8 g/dL — ABNORMAL LOW (ref 13.0–17.0)
MCH: 30.6 pg (ref 26.0–34.0)
MCHC: 32.1 g/dL (ref 30.0–36.0)
MCV: 95.6 fL (ref 80.0–100.0)
Platelets: 149 10*3/uL — ABNORMAL LOW (ref 150–400)
RBC: 3.85 MIL/uL — ABNORMAL LOW (ref 4.22–5.81)
RDW: 20.6 % — ABNORMAL HIGH (ref 11.5–15.5)
WBC: 6.2 10*3/uL (ref 4.0–10.5)
nRBC: 0 % (ref 0.0–0.2)

## 2022-09-16 LAB — URINALYSIS, ROUTINE W REFLEX MICROSCOPIC
RBC / HPF: >50 RBC/hpf — ABNORMAL HIGH (ref 0–5)
WBC, UA: >50 WBC/hpf — ABNORMAL HIGH (ref 0–5)

## 2022-09-16 LAB — COMPREHENSIVE METABOLIC PANEL
ALT: 28 U/L (ref 0–44)
AST: 26 U/L (ref 15–41)
Albumin: 2.2 g/dL — ABNORMAL LOW (ref 3.5–5.0)
Alkaline Phosphatase: 47 U/L (ref 38–126)
Anion gap: 11 (ref 5–15)
BUN: 38 mg/dL — ABNORMAL HIGH (ref 8–23)
CO2: 23 mmol/L (ref 22–32)
Calcium: 8.2 mg/dL — ABNORMAL LOW (ref 8.9–10.3)
Chloride: 106 mmol/L (ref 98–111)
Creatinine, Ser: 1.96 mg/dL — ABNORMAL HIGH (ref 0.61–1.24)
GFR, Estimated: 35 mL/min — ABNORMAL LOW (ref >60)
Glucose, Bld: 107 mg/dL — ABNORMAL HIGH (ref 70–99)
Potassium: 4.7 mmol/L (ref 3.5–5.1)
Sodium: 140 mmol/L (ref 135–145)
Total Bilirubin: 0.6 mg/dL (ref 0.3–1.2)
Total Protein: 4.4 g/dL — ABNORMAL LOW (ref 6.5–8.1)

## 2022-09-16 LAB — PHOSPHORUS: Phosphorus: 3.3 mg/dL (ref 2.5–4.6)

## 2022-09-16 LAB — BASIC METABOLIC PANEL
Anion gap: 7 (ref 5–15)
BUN: 38 mg/dL — ABNORMAL HIGH (ref 8–23)
CO2: 26 mmol/L (ref 22–32)
Calcium: 7.9 mg/dL — ABNORMAL LOW (ref 8.9–10.3)
Chloride: 104 mmol/L (ref 98–111)
Creatinine, Ser: 1.84 mg/dL — ABNORMAL HIGH (ref 0.61–1.24)
GFR, Estimated: 38 mL/min — ABNORMAL LOW (ref >60)
Glucose, Bld: 103 mg/dL — ABNORMAL HIGH (ref 70–99)
Potassium: 4.5 mmol/L (ref 3.5–5.1)
Sodium: 137 mmol/L (ref 135–145)

## 2022-09-16 LAB — CK: Total CK: 47 U/L — ABNORMAL LOW (ref 49–397)

## 2022-09-16 LAB — SODIUM, URINE, RANDOM: Sodium, Ur: 101 mmol/L

## 2022-09-16 LAB — VITAMIN B12: Vitamin B-12: 895 pg/mL (ref 180–914)

## 2022-09-16 LAB — CREATININE, URINE, RANDOM: Creatinine, Urine: 61 mg/dL

## 2022-09-16 MED ORDER — METOPROLOL TARTRATE 25 MG PO TABS
25.0000 mg | ORAL_TABLET | Freq: Two times a day (BID) | ORAL | Status: DC
  Administered 2022-09-16 – 2022-09-23 (×9): 25 mg via ORAL
  Filled 2022-09-16 (×14): qty 1

## 2022-09-16 MED ORDER — LEVOTHYROXINE SODIUM 25 MCG PO TABS
125.0000 ug | ORAL_TABLET | Freq: Every day | ORAL | Status: DC
  Administered 2022-09-16 – 2022-09-23 (×8): 125 ug via ORAL
  Filled 2022-09-16 (×8): qty 1

## 2022-09-16 MED ORDER — ONDANSETRON HCL 4 MG/2ML IJ SOLN: 4.0000 mg | Freq: Four times a day (QID) | INTRAMUSCULAR | Status: DC | PRN

## 2022-09-16 MED ORDER — VITAMIN C 500 MG PO TABS
500.0000 mg | ORAL_TABLET | Freq: Every day | ORAL | Status: DC
  Administered 2022-09-16 – 2022-09-23 (×8): 500 mg via ORAL
  Filled 2022-09-16 (×8): qty 1

## 2022-09-16 MED ORDER — SODIUM CHLORIDE 0.9 % IV SOLN: INTRAVENOUS | Status: AC

## 2022-09-16 MED ORDER — SODIUM CHLORIDE 0.9 % IV SOLN
2.0000 g | Freq: Two times a day (BID) | INTRAVENOUS | Status: DC
  Administered 2022-09-16 – 2022-09-17 (×2): 2 g via INTRAVENOUS
  Filled 2022-09-16 (×2): qty 12.5

## 2022-09-16 MED ORDER — OXYCODONE HCL 5 MG PO TABS: 5.0000 mg | ORAL_TABLET | ORAL | Status: DC | PRN

## 2022-09-16 MED ORDER — SODIUM CHLORIDE 0.9 % IV SOLN: 2.0000 g | INTRAVENOUS | Status: DC

## 2022-09-16 MED ORDER — DULOXETINE HCL 60 MG PO CPEP
60.0000 mg | ORAL_CAPSULE | Freq: Two times a day (BID) | ORAL | Status: DC
  Administered 2022-09-16 – 2022-09-23 (×15): 60 mg via ORAL
  Filled 2022-09-16 (×3): qty 1
  Filled 2022-09-16: qty 2
  Filled 2022-09-16 (×11): qty 1

## 2022-09-16 MED ORDER — PANTOPRAZOLE SODIUM 40 MG PO TBEC
40.0000 mg | DELAYED_RELEASE_TABLET | Freq: Every day | ORAL | Status: DC
  Administered 2022-09-16 – 2022-09-23 (×8): 40 mg via ORAL
  Filled 2022-09-16 (×8): qty 1

## 2022-09-16 MED ORDER — SODIUM CHLORIDE 0.9 % IV SOLN
2.0000 g | Freq: Once | INTRAVENOUS | Status: AC
  Administered 2022-09-16: 2 g via INTRAVENOUS
  Filled 2022-09-16: qty 12.5

## 2022-09-16 MED ORDER — ENSURE ENLIVE PO LIQD
237.0000 mL | Freq: Two times a day (BID) | ORAL | Status: DC
  Administered 2022-09-16 – 2022-09-19 (×5): 237 mL via ORAL
  Filled 2022-09-16 (×2): qty 237

## 2022-09-16 MED ORDER — CLONAZEPAM 0.5 MG PO TABS
1.0000 mg | ORAL_TABLET | Freq: Three times a day (TID) | ORAL | Status: DC | PRN
  Administered 2022-09-16 – 2022-09-22 (×9): 1 mg via ORAL
  Filled 2022-09-16 (×9): qty 2

## 2022-09-16 MED ORDER — ALLOPURINOL 100 MG PO TABS
100.0000 mg | ORAL_TABLET | Freq: Two times a day (BID) | ORAL | Status: DC
  Administered 2022-09-16 – 2022-09-23 (×15): 100 mg via ORAL
  Filled 2022-09-16 (×16): qty 1

## 2022-09-16 MED ORDER — RIVAROXABAN 10 MG PO TABS
20.0000 mg | ORAL_TABLET | Freq: Every day | ORAL | Status: DC
  Administered 2022-09-16: 20 mg via ORAL
  Filled 2022-09-16: qty 1
  Filled 2022-09-16: qty 2

## 2022-09-16 MED ORDER — ACETAMINOPHEN 650 MG RE SUPP: 650.0000 mg | Freq: Four times a day (QID) | RECTAL | Status: DC | PRN

## 2022-09-16 MED ORDER — ACYCLOVIR 400 MG PO TABS
400.0000 mg | ORAL_TABLET | Freq: Two times a day (BID) | ORAL | Status: DC
  Administered 2022-09-16 – 2022-09-23 (×15): 400 mg via ORAL
  Filled 2022-09-16 (×17): qty 1

## 2022-09-16 MED ORDER — ATORVASTATIN CALCIUM 80 MG PO TABS
80.0000 mg | ORAL_TABLET | Freq: Every day | ORAL | Status: DC
  Administered 2022-09-16 – 2022-09-23 (×8): 80 mg via ORAL
  Filled 2022-09-16 (×2): qty 1
  Filled 2022-09-16: qty 2
  Filled 2022-09-16 (×4): qty 1

## 2022-09-16 MED ORDER — BUPROPION HCL ER (XL) 150 MG PO TB24
300.0000 mg | ORAL_TABLET | Freq: Every day | ORAL | Status: DC
  Administered 2022-09-16 – 2022-09-23 (×8): 300 mg via ORAL
  Filled 2022-09-16: qty 2
  Filled 2022-09-16: qty 1
  Filled 2022-09-16 (×6): qty 2

## 2022-09-16 MED ORDER — ONDANSETRON HCL 4 MG PO TABS: 4.0000 mg | ORAL_TABLET | Freq: Four times a day (QID) | ORAL | Status: DC | PRN

## 2022-09-16 MED ORDER — ACETAMINOPHEN 325 MG PO TABS: 650.0000 mg | ORAL_TABLET | Freq: Four times a day (QID) | ORAL | Status: DC | PRN

## 2022-09-16 MED ORDER — EZETIMIBE 10 MG PO TABS
10.0000 mg | ORAL_TABLET | Freq: Every day | ORAL | Status: DC
  Administered 2022-09-16 – 2022-09-22 (×7): 10 mg via ORAL
  Filled 2022-09-16 (×8): qty 1

## 2022-09-16 MED ORDER — ALBUTEROL SULFATE (2.5 MG/3ML) 0.083% IN NEBU: 2.5000 mg | INHALATION_SOLUTION | Freq: Four times a day (QID) | RESPIRATORY_TRACT | Status: DC | PRN

## 2022-09-16 NOTE — ED Notes (Signed)
Got patient up to the bathroom to try to give urine sample. Patient was very weak and had a hard time getting up from the bed into the wheelchair. This RN also noticed a lot of blood in patients brief. MD notified.

## 2022-09-16 NOTE — ED Notes (Signed)
New linens, gown, brief and purewick provided to patient

## 2022-09-16 NOTE — Evaluation (Signed)
Occupational Therapy Evaluation Patient Details Name: [name] MRN: [id number] DOB: 11-04-1945 Today's Date: 09/16/2022   History of Present Illness Pt is a 76 y/o male who presented with weakness and falls at home with dysuria and gross hematuria. 2 recent admissions. PMH: a fib, BPH; CAD; chronic diastolic CHF; B cell lymphoma; HTN; HLD; OSA on CPAP; plaque psoriasis; and hypothyroidism   Clinical Impression   Pt known to this OT from prior admission. PTA, pt lives with spouse, typically Modified Independent with ADLs, basic household tasks and mobility using RW. Pt's spouse is available 24/7 to provide assist as needed. Pt presents now with deficits noted below and saturated from incontinence. Pt requires Setup for UB ADL, Max-Total A for LB ADLs due to incontinence and Min A to roll on stretcher for cleanup. Pt politely declined OOB attempts this AM. Pt endorses a cycle of orthostatics BP and UTI w/ chemo treatments. Discussed DC options with pt reporting preference for [facility] therapy follow up w/ spouse assist at home. Pt agreeable for DC home at w/c level if with difficulty progressing transfers at acute level. Will continue to follow.      Recommendations for follow up therapy are one component of a multi-disciplinary discharge planning process, led by the attending physician.  Recommendations may be updated based on patient status, additional functional criteria and insurance authorization.   Follow Up Recommendations  Home health OT     Assistance Recommended at Discharge Intermittent Supervision/Assistance  Patient can return home with the following A little help with walking and/or transfers;A little help with bathing/dressing/bathroom;Assistance with cooking/housework;Assist for transportation;Help with stairs or ramp for entrance    Functional Status Assessment  Patient has had a recent decline in their functional status and demonstrates the ability to make significant  improvements in function in a reasonable and predictable amount of time.  Equipment Recommendations  None recommended by OT    Recommendations for Other Services       Precautions / Restrictions Precautions Precautions: Fall Precaution Comments: monitor orthostatics, bladder incontinence Restrictions Weight Bearing Restrictions: No      Mobility Bed Mobility Overal bed mobility: Needs Assistance Bed Mobility: Rolling Rolling: Min assist         General bed mobility comments: to roll for cleanup, increased pain/limited roll to L side    Transfers                   General transfer comment: pt politely declined attempting      Balance                                           ADL either performed or assessed with clinical judgement   ADL Overall ADL's : Needs assistance/impaired Eating/Feeding: Independent   Grooming: Set up   Upper Body Bathing: Set up;Bed level   Lower Body Bathing: Maximal assistance;Bed level   Upper Body Dressing : Set up;Bed level   Lower Body Dressing: Total assistance;Bed level Lower Body Dressing Details (indicate cue type and reason): assist for doffing saturated pants/brief bed level     Toileting- Clothing Manipulation and Hygiene: Total assistance;Bed level         General ADL Comments: Pt with saturated clothing, assist to doff clothing/brief (noted to be red tinted) and don clean gown stretcher level. BP soft bed level and pt politely declined EOB/OOB attempts due to  difficulty with prior attempt w/ nursing yesterday     Vision Baseline Vision/History: 1 Wears glasses Ability to See in Adequate Light: 0 Adequate Patient Visual Report: No change from baseline Vision Assessment?: No apparent visual deficits     Perception     Praxis      Pertinent Vitals/Pain Pain Assessment Pain Assessment: Faces Faces Pain Scale: Hurts even more Pain Location: L hip (hole from chemo; bandaged) Pain  Descriptors / Indicators: Grimacing, Guarding Pain Intervention(s): Limited activity within patient's tolerance, Repositioned     Hand Dominance Left   Extremity/Trunk Assessment Upper Extremity Assessment Upper Extremity Assessment: Generalized weakness   Lower Extremity Assessment Lower Extremity Assessment: Defer to PT evaluation   Cervical / Trunk Assessment Cervical / Trunk Assessment: Kyphotic   Communication Communication Communication: No difficulties   Cognition Arousal/Alertness: Awake/alert Behavior During Therapy: WFL for tasks assessed/performed Overall Cognitive Status: Within Functional Limits for tasks assessed                                       General Comments  First BP 86/51 (57). With smaller cuff and removed sweatshirt, BP96/66    Exercises     Shoulder Instructions      Home Living Family/patient expects to be discharged to:: Private residence Living Arrangements: Spouse/significant other Available Help at Discharge: Available 24 hours/day;Family Type of Home: House Home Access: Stairs to enter [employer] of Steps: 4 Entrance Stairs-Rails: Right Home Layout: Two level Alternate Level Stairs-Number of Steps: has chair lift - still needs to get up 4 steps to chair lift   Bathroom Shower/Tub: Occupational psychologist: Handicapped height Bathroom Accessibility: Yes How Accessible: Accessible via walker Home Equipment: [place] (2 wheels);Cane - single point;Wheelchair - Counsellor (4 wheels);Grab bars - tub/shower          Prior Functioning/Environment Prior Level of Function : Independent/Modified Independent;Driving             Mobility Comments: amb with RW in home and community ADLs Comments: independent with ADLs        OT Problem List: Cardiopulmonary status limiting activity      OT Treatment/Interventions: Self-care/ADL training;Therapeutic exercise;Energy  conservation;DME and/or AE instruction;Therapeutic activities;Patient/family education    OT Goals(Current goals can be found in the care plan section) Acute Rehab OT Goals Patient Stated Goal: home w/ assist from partner OT Goal Formulation: With patient Time For Goal Achievement: 09/30/22 Potential to Achieve Goals: Good  OT Frequency: Min 2X/week    Co-evaluation PT/OT/SLP Co-Evaluation/Treatment: Yes Reason for Co-Treatment: For patient/therapist safety;To address functional/ADL transfers   OT goals addressed during session: ADL's and self-care      AM-PAC OT "6 Clicks" Daily Activity     Outcome Measure Help from another person eating meals?: None Help from another person taking care of personal grooming?: A Little Help from another person toileting, which includes using toliet, bedpan, or urinal?: Total Help from another person bathing (including washing, rinsing, drying)?: A Lot Help from another person to put on and taking off regular upper body clothing?: A Little Help from another person to put on and taking off regular lower body clothing?: A Lot 6 Click Score: 15   End of Session Nurse Communication: Mobility status  Activity Tolerance: Patient limited by fatigue Patient left: in bed;with call bell/phone within reach  OT Visit Diagnosis: Muscle weakness (generalized) (M62.81)  Time: 7628-3151 OT Time Calculation (min): 27 min Charges:  OT General Charges $OT Visit: 1 Visit OT Evaluation $OT Eval Moderate Complexity: 1 Mod  Malachy Chamber, OTR/L Acute Rehab Services Office: [phone]   Layla Maw 09/16/2022, 10:07 AM

## 2022-09-16 NOTE — ED Provider Notes (Signed)
4:46 AM Patient's evaluation is significant for recurrent AKI with creatinine from 1.12 up to 1.96 today. His UA is also concerning for refractory UTI with many bacteria and WBCs. Culture ordered. Last urine culture grew proteus mirabilis, sensitive to cephalosporins; Cefepime ordered. Plan to admit to hospitalist service; case discussed with Dr. Myna Hidalgo who will admit.  Results for orders placed or performed during the hospital encounter of 09/15/22  Comprehensive metabolic panel  Result Value Ref Range   Sodium 140 135 - 145 mmol/L   Potassium 4.7 3.5 - 5.1 mmol/L   Chloride 106 98 - 111 mmol/L   CO2 23 22 - 32 mmol/L   Glucose, Bld 107 (H) 70 - 99 mg/dL   BUN 38 (H) 8 - 23 mg/dL   Creatinine, Ser 1.96 (H) 0.61 - 1.24 mg/dL   Calcium 8.2 (L) 8.9 - 10.3 mg/dL   Total Protein 4.4 (L) 6.5 - 8.1 g/dL   Albumin 2.2 (L) 3.5 - 5.0 g/dL   AST 26 15 - 41 U/L   ALT 28 0 - 44 U/L   Alkaline Phosphatase 47 38 - 126 U/L   Total Bilirubin 0.6 0.3 - 1.2 mg/dL   GFR, Estimated 35 (L) >60 mL/min   Anion gap 11 5 - 15  CBC with Differential  Result Value Ref Range   WBC 7.5 4.0 - 10.5 K/uL   RBC 3.97 (L) 4.22 - 5.81 MIL/uL   Hemoglobin 12.4 (L) 13.0 - 17.0 g/dL   HCT 37.4 (L) 39.0 - 52.0 %   MCV 94.2 80.0 - 100.0 fL   MCH 31.2 26.0 - 34.0 pg   MCHC 33.2 30.0 - 36.0 g/dL   RDW 20.4 (H) 11.5 - 15.5 %   Platelets 145 (L) 150 - 400 K/uL   nRBC 0.0 0.0 - 0.2 %   Neutrophils Relative % 73 %   Neutro Abs 5.4 1.7 - 7.7 K/uL   Lymphocytes Relative 17 %   Lymphs Abs 1.3 0.7 - 4.0 K/uL   Monocytes Relative 6 %   Monocytes Absolute 0.5 0.1 - 1.0 K/uL   Eosinophils Relative 1 %   Eosinophils Absolute 0.1 0.0 - 0.5 K/uL   Basophils Relative 0 %   Basophils Absolute 0.0 0.0 - 0.1 K/uL   Immature Granulocytes 3 %   Abs Immature Granulocytes 0.22 (H) 0.00 - 0.07 K/uL  Urinalysis, Routine w reflex microscopic  Result Value Ref Range   Color, Urine RED (A) YELLOW   APPearance TURBID (A) CLEAR   Specific  Gravity, Urine  1.005 - 1.030    TEST NOT REPORTED DUE TO COLOR INTERFERENCE OF URINE PIGMENT   pH  5.0 - 8.0    TEST NOT REPORTED DUE TO COLOR INTERFERENCE OF URINE PIGMENT   Glucose, UA (A) NEGATIVE mg/dL    TEST NOT REPORTED DUE TO COLOR INTERFERENCE OF URINE PIGMENT   Hgb urine dipstick (A) NEGATIVE    TEST NOT REPORTED DUE TO COLOR INTERFERENCE OF URINE PIGMENT   Bilirubin Urine (A) NEGATIVE    TEST NOT REPORTED DUE TO COLOR INTERFERENCE OF URINE PIGMENT   Ketones, ur (A) NEGATIVE mg/dL    TEST NOT REPORTED DUE TO COLOR INTERFERENCE OF URINE PIGMENT   Protein, ur (A) NEGATIVE mg/dL    TEST NOT REPORTED DUE TO COLOR INTERFERENCE OF URINE PIGMENT   Nitrite (A) NEGATIVE    TEST NOT REPORTED DUE TO COLOR INTERFERENCE OF URINE PIGMENT   Leukocytes,Ua (A) NEGATIVE    TEST NOT REPORTED  DUE TO COLOR INTERFERENCE OF URINE PIGMENT   RBC / HPF >50 (H) 0 - 5 RBC/hpf   WBC, UA >50 (H) 0 - 5 WBC/hpf   Bacteria, UA MANY (A) NONE SEEN   *Note: Due to a large number of results and/or encounters for the requested time period, some results have not been displayed. A complete set of results can be found in Results Review.   DG Chest Port 1 View  Result Date: 09/16/2022 CLINICAL DATA:  Weakness EXAM: PORTABLE CHEST 1 VIEW COMPARISON:  09/05/2022 FINDINGS: Right Port-A-Cath remains in place, unchanged. Heart and mediastinal contours are within normal limits. No focal opacities or effusions. No acute bony abnormality. IMPRESSION: No active cardiopulmonary disease. Electronically Signed   By: Rolm Baptise M.D.   On: 09/16/2022 00:53    Antonietta Breach, PA-C 09/16/22 0448    Mesner, Corene Cornea, MD 09/16/22 [phone]

## 2022-09-16 NOTE — Progress Notes (Addendum)
[facility] PROGRESS NOTE    Progress Note  [name]  EYC:[id number] DOB: 19-May-1946 DOA: 09/15/2022 PCP: Cassandria Anger, MD     Brief Narrative:   [name] is an 76 y.o. male past medical history of psoriasis, COPD, essential hypertension, chronic systolic heart failure atrial fibrillation on Xarelto recurrent UTI diffuse large B-cell lymphoma comes into the ED for generalized weakness dysuria, gross hematuria and a fall, he was recently treated for Proteus UTI and his urine symptoms resolved but shortly after completing the course became traumatic.  In the ED was found to be febrile UA showed many white blood cells red cells bacteria acute kidney injury (with a creatinine of 1.8 with a baseline like around 1.1-1.3)   Assessment/Plan:   AKI (acute kidney injury) ([place]) Baseline creatinine around 1.1-1.3 on admission 1.8 with gross hematuria. He was resuscitated started empirically on antibiotics cefepime. Renal ultrasound bilateral renal cortical thickening hydronephrosis. His creatinine is slowly improving we will continue IV fluids.  Hematuria recurrent UTIs in the setting of Xarelto: The patient reports dysuria and gross hematuria. He is not septic he was start empirically on cefepime. Blood cultures and urine cultures have been ordered. Mild drop in hemoglobin from 12.4-11.8.  Continue to monitor closely.  Normocytic anemia/acute blood loss anemia: Likely due to hematuria in the setting of Xarelto last month it was 13.3 this morning is 11.8. Patient is currently asymptomatic continue to monitor closely.  Chronic systolic heart failure with a last EF of 35%: Appears to be compensated, continue beta-blockers will be judicious with IV fluids. He is not on any diuretics at home. Continue to monitor strict I's and O's.  Diffuse large B cell lymphoma: Recurrent on April 2023 currently on epcoritamab with last dose 09/15/22  .  Chronic atrial  fibrillation: Continue metoprolol and Xarelto.  COPD/obstructive sleep apnea: Compensated CPAP at night.  Hypothyroidism: Continue Synthroid.  Anxiety/depression: Continue Wellbutrin Cymbalta and Klonopin.   DVT prophylaxis: SCDs Family Communication:none Status is: Observation The patient will require care spanning > 2 midnights and should be moved to inpatient because: Acute kidney injury and recurrent UTI with hematuria    Code Status:     Code Status Orders  (From admission, onward)           Start     Ordered   09/16/22 0456  Do not attempt resuscitation (DNR)  Continuous       Question Answer Comment  In the event of cardiac or respiratory ARREST Do not call a "code blue"   In the event of cardiac or respiratory ARREST Do not perform Intubation, CPR, defibrillation or ACLS   In the event of cardiac or respiratory ARREST Use medication by any route, position, wound care, and other measures to relive pain and suffering. May use oxygen, suction and manual treatment of airway obstruction as needed for comfort.   Comments verified with pt and pt's husband [name]      09/16/22 0457           Code Status History     Date Active Date Inactive Code Status Order ID Comments User Context   09/06/2022 0015 09/07/2022 2238 DNR [id number]  Toy Baker, MD Inpatient   07/21/2022 0913 07/23/2022 2338 DNR [id number]  Karmen Bongo, MD ED   04/23/2022 0403 04/27/2022 2024 DNR [id number]  Vianne Bulls, MD ED   03/19/2022 2059 03/26/2022 1733 DNR [id number]  Kristopher Oppenheim, DO Inpatient   03/06/2022 2124 03/08/2022 1841 Full Code  [id number]  Etta Quill, DO Inpatient   08/13/2019 1130 08/13/2019 1833 Full Code [id number]  Nelva Bush, MD Inpatient   04/17/2019 1627 04/18/2019 1809 Full Code [id number]  Cleon Gustin, MD Inpatient   03/26/2019 1821 03/29/2019 1409 Full Code [id number]  Cleon Gustin, MD Inpatient   11/09/2018 0316 11/11/2018 1635 Full Code [id number]   Rise Patience, MD ED   10/24/2017 1459 10/25/2017 1630 Full Code [id number]  Cleon Gustin, MD Inpatient   07/15/2017 1221 07/16/2017 1707 Full Code [id number]  Leighton Parody, PA-C Inpatient   04/11/2017 1716 04/13/2017 1724 Full Code [id number]  Neldon Newport Inpatient   02/08/2017 1123 02/09/2017 1722 Full Code [id number]  Cleon Gustin, MD Inpatient   11/03/2016 0027 11/05/2016 2205 Full Code [id number]  Ivor Costa, MD ED   11/15/2013 1700 11/19/2013 1824 Full Code [id number]  Pricilla Loveless, PA-C Inpatient   10/26/2013 1709 10/30/2013 1739 Full Code [id number]  Pricilla Loveless, PA-C Inpatient   09/17/2013 1856 09/20/2013 1622 Full Code [id number]  Pricilla Loveless, PA-C Inpatient   02/21/2013 0322 02/24/2013 1707 Full Code [id number]  [name], MD Inpatient   02/03/2013 0049 02/09/2013 1825 Full Code [id number]  [name] Schooling, MD ED   09/29/2012 0311 10/10/2012 1814 Full Code [id number]  Abbott Pao, RN Inpatient         IV Access:   Peripheral IV   Procedures and diagnostic studies:   US RENAL  Result Date: 09/16/2022 CLINICAL DATA:  Acute kidney injury.  Gross hematuria. EXAM: RENAL / URINARY TRACT ULTRASOUND COMPLETE COMPARISON:  CT scan 09/05/2022. FINDINGS: Right Kidney: Renal measurements: 9.7 x 6.1 x 5.8 cm = volume: 182 mL. Cortical thinning with increased echogenicity of parenchyma. No hydronephrosis. Left Kidney: Renal measurements: 10.7 x 6.1 x 8.3 cm = volume: 285 mL. Cortical thinning with increased echogenicity. 2.3 cm posterior interpolar simple cyst identified as noted on previous CT. Bladder: Right-sided bladder wall thickening, similar to prior CT. Other: None. IMPRESSION: 1. Bilateral renal cortical thinning with increased echogenicity of renal parenchyma compatible with chronic medical renal disease. 2. No hydronephrosis. 3. Right-sided bladder wall thickening, similar to prior CT. Electronically Signed   By: Misty Stanley M.D.   On: 09/16/2022 06:02    DG Chest Port 1 View  Result Date: 09/16/2022 CLINICAL DATA:  Weakness EXAM: PORTABLE CHEST 1 VIEW COMPARISON:  09/05/2022 FINDINGS: Right Port-A-Cath remains in place, unchanged. Heart and mediastinal contours are within normal limits. No focal opacities or effusions. No acute bony abnormality. IMPRESSION: No active cardiopulmonary disease. Electronically Signed   By: Rolm Baptise M.D.   On: 09/16/2022 00:53     Medical Consultants:   None.   Subjective:    DEYONTE CADDEN continues to have dysuria  Objective:    Vitals:   09/15/22 2219 09/15/22 2226 09/16/22 0220 09/16/22 0302  BP: (!) 95/50  (!) 95/57 114/84  Pulse: 77  72   Resp: 18  16   Temp:  97.6 F (36.4 C)    TempSrc:  Oral    SpO2: 97%  100%   Weight: 103.9 kg     Height: '6\' 4"'$  (1.93 m)      SpO2: 100 %   Intake/Output Summary (Last 24 hours) at 09/16/2022 0711 Last data filed at 09/16/2022 0553 Gross per 24 hour  Intake 1100 ml  Output --  Net 1100 ml   Filed Weights   09/15/22 2219  Weight: 103.9 kg  Exam: General exam: In no acute distress. Respiratory system: Good air movement and clear to auscultation. Cardiovascular system: S1 & S2 heard, RRR. No JVD. Gastrointestinal system: Abdomen is nondistended, soft and nontender.  Extremities: No pedal edema. Skin: No rashes, lesions or ulcers Psychiatry: Judgement and insight appear normal. Mood & affect appropriate.    Data Reviewed:    Labs: Basic Metabolic Panel: Recent Labs  Lab 09/16/22 0127 09/16/22 0557  NA 140 137  K 4.7 4.5  CL 106 104  CO2 23 26  GLUCOSE 107* 103*  BUN 38* 38*  CREATININE 1.96* 1.84*  CALCIUM 8.2* 7.9*  MG  --  1.9  PHOS  --  3.3   GFR Estimated Creatinine Clearance: 41.9 mL/min (A) (by C-G formula based on SCr of 1.84 mg/dL (H)). Liver Function Tests: Recent Labs  Lab 09/16/22 0127  AST 26  ALT 28  ALKPHOS 47  BILITOT 0.6  PROT 4.4*  ALBUMIN 2.2*   No results for input(s): "LIPASE",  "AMYLASE" in the last 168 hours. No results for input(s): "AMMONIA" in the last 168 hours. Coagulation profile No results for input(s): "INR", "PROTIME" in the last 168 hours. COVID-19 Labs  No results for input(s): "DDIMER", "FERRITIN", "LDH", "CRP" in the last 72 hours.  Lab Results  Component Value Date   SARSCOV2NAA NEGATIVE 09/05/2022   SARSCOV2NAA NEGATIVE 01/06/2022   SARSCOV2NAA POSITIVE (A) 10/13/2021   [place] NEGATIVE 05/19/2021    CBC: Recent Labs  Lab 09/16/22 0127 09/16/22 0557  WBC 7.5 6.2  NEUTROABS 5.4  --   HGB 12.4* 11.8*  HCT 37.4* 36.8*  MCV 94.2 95.6  PLT 145* 149*   Cardiac Enzymes: Recent Labs  Lab 09/16/22 0557  CKTOTAL 47*   BNP (last 3 results) No results for input(s): "PROBNP" in the last 8760 hours. CBG: No results for input(s): "GLUCAP" in the last 168 hours. D-Dimer: No results for input(s): "DDIMER" in the last 72 hours. Hgb A1c: No results for input(s): "HGBA1C" in the last 72 hours. Lipid Profile: No results for input(s): "CHOL", "HDL", "LDLCALC", "TRIG", "CHOLHDL", "LDLDIRECT" in the last 72 hours. Thyroid function studies: No results for input(s): "TSH", "T4TOTAL", "T3FREE", "THYROIDAB" in the last 72 hours.  Invalid input(s): "FREET3" Anemia work up: No results for input(s): "VITAMINB12", "FOLATE", "FERRITIN", "TIBC", "IRON", "RETICCTPCT" in the last 72 hours. Sepsis Labs: Recent Labs  Lab 09/16/22 0127 09/16/22 0557  WBC 7.5 6.2   Microbiology No results found for this or any previous visit (from the past 240 hour(s)).   Medications:    acyclovir  400 mg Oral BID   allopurinol  100 mg Oral BID   buPROPion  300 mg Oral Daily   DULoxetine  60 mg Oral BID   levothyroxine  125 mcg Oral Q0600   metoprolol tartrate  25 mg Oral BID   pantoprazole  40 mg Oral Daily   rivaroxaban  20 mg Oral Q supper   Continuous Infusions:  [START ON 09/17/2022] ceFEPime (MAXIPIME) IV        LOS: 0 days   Charlynne Cousins  [facility]  09/16/2022, 7:11 AM

## 2022-09-16 NOTE — Progress Notes (Signed)
Pharmacy Antibiotic Note  [name] is a 76 y.o. male admitted on 09/15/2022 with UTI.  Pharmacy has been consulted for cefepime dosing.  Plan: Cefepime 2g IV Q24H.  Height: '6\' 4"'$  (193 cm) Weight: 103.9 kg (229 lb) IBW/kg (Calculated) : 86.8  Temp (24hrs), Avg:97.6 F (36.4 C), Min:97.6 F (36.4 C), Max:97.6 F (36.4 C)  Recent Labs  Lab 09/16/22 0127  WBC 7.5  CREATININE 1.96*    Estimated Creatinine Clearance: 39.4 mL/min (A) (by C-G formula based on SCr of 1.96 mg/dL (H)).    Allergies  Allergen Reactions   Short Ragweed Pollen Ext Cough    Thank you for allowing pharmacy to be a part of this patient's care.  Wynona Neat, PharmD, BCPS  09/16/2022 5:14 AM

## 2022-09-16 NOTE — Progress Notes (Signed)
PHARMACY NOTE:  ANTIMICROBIAL RENAL DOSAGE ADJUSTMENT  Current antimicrobial regimen includes a mismatch between antimicrobial dosage and estimated renal function.  As per policy approved by the Pharmacy & Therapeutics and Medical Executive Committees, the antimicrobial dosage will be adjusted accordingly.  Current antimicrobial dosage:  Cefepime 2g Q24H   Indication: UTI   Renal Function:  Estimated Creatinine Clearance: 41.9 mL/min (A) (by C-G formula based on SCr of 1.84 mg/dL (H)). AKI improving, baseline Scr ~ 1.0     Antimicrobial dosage has been changed to:  Cefepime 2g Q12H    Thank you for allowing pharmacy to be a part of this patient's care.  Esmeralda Arthur, PharmD, BCCCP

## 2022-09-16 NOTE — Evaluation (Signed)
Physical Therapy Evaluation Patient Details Name: [name] MRN: [id number] DOB: 02-Jan-1946 Today's Date: 09/16/2022  History of Present Illness  Pt is a 76 y/o male who presented with weakness and falls at home with dysuria and gross hematuria. 2 recent admissions. PMH: a fib, BPH; CAD; chronic diastolic CHF; B cell lymphoma; HTN; HLD; OSA on CPAP; plaque psoriasis; and hypothyroidism  Clinical Impression  Pt admitted with above diagnosis. Pt from home with his husband and wants to return there as long as he can transfer surface to surface with min A. Eval limited today due to pt being on stretcher in ED and fearful of falling as well as being in soaked clothes and linens. Pt with generalized weakness with strength testing but anticipate him being able to transfer. Will follow acutely to advance mobility and recommend return home with HHPT.  Pt currently with functional limitations due to the deficits listed below (see PT Problem List). Pt will benefit from skilled PT to increase their independence and safety with mobility to allow discharge to the venue listed below.          Recommendations for follow up therapy are one component of a multi-disciplinary discharge planning process, led by the attending physician.  Recommendations may be updated based on patient status, additional functional criteria and insurance authorization.  Follow Up Recommendations Home health PT      Assistance Recommended at Discharge Intermittent Supervision/Assistance  Patient can return home with the following  Assistance with cooking/housework;Assist for transportation;Help with stairs or ramp for entrance;A lot of help with walking and/or transfers;A little help with bathing/dressing/bathroom    Equipment Recommendations None recommended by PT  Recommendations for Other Services       Functional Status Assessment Patient has had a recent decline in their functional status and demonstrates the ability  to make significant improvements in function in a reasonable and predictable amount of time.     Precautions / Restrictions Precautions Precautions: Fall Precaution Comments: monitor orthostatics, bladder incontinence Restrictions Weight Bearing Restrictions: No      Mobility  Bed Mobility Overal bed mobility: Needs Assistance Bed Mobility: Rolling Rolling: Min assist         General bed mobility comments: to roll for cleanup, increased pain/limited roll to L side. Pt not OK with coming to EOB on eval since he was on stretcher and fearful of falling. Sat straight up into long sitting with mod A from [place] elevated. Has discomfort with all mvmt    Transfers                   General transfer comment: pt politely declined attempting    Ambulation/Gait               General Gait Details: not tested today. Pt has rollator but he is scared of it. He has the goal of using it in the community though. Discussed pushing it with breaks on so it doesn't roll as easily. Good to work on while he's here  Financial trader [place] (Stroke Patients Only)       Balance Overall balance assessment: History of Falls, Needs assistance Sitting-balance support: No upper extremity supported, Feet supported Sitting balance-Leahy Scale: Good  Pertinent Vitals/Pain Pain Assessment Pain Assessment: Faces Faces Pain Scale: Hurts even more Pain Location: L hip (hole from chemo; bandaged) Pain Descriptors / Indicators: Grimacing, Guarding Pain Intervention(s): Limited activity within patient's tolerance    Home Living Family/patient expects to be discharged to:: Private residence Living Arrangements: Spouse/significant other Available Help at Discharge: Available 24 hours/day;Family Type of Home: House Home Access: Stairs to enter Entrance Stairs-Rails: Right Entrance Stairs-Number of  Steps: 4 Alternate Level Stairs-Number of Steps: has chair lift - still needs to get up 4 steps to chair lift Home Layout: Two level Home Equipment: Conservation officer, nature (2 wheels);Cane - single point;Wheelchair - Counsellor (4 wheels);Grab bars - tub/shower      Prior Function Prior Level of Function : Independent/Modified Independent;Driving             Mobility Comments: amb with RW in home and community ADLs Comments: independent with ADLs     Hand Dominance   Dominant Hand: Left    Extremity/Trunk Assessment   Upper Extremity Assessment Upper Extremity Assessment: Defer to OT evaluation    Lower Extremity Assessment Lower Extremity Assessment: Generalized weakness LLE Deficits / Details: noted deformity at the L knee, not painful    Cervical / Trunk Assessment Cervical / Trunk Assessment: Kyphotic  Communication   Communication: No difficulties  Cognition Arousal/Alertness: Awake/alert Behavior During Therapy: WFL for tasks assessed/performed Overall Cognitive Status: Within Functional Limits for tasks assessed                                 General Comments: states that he is  anxious that he is in hospital again        General Comments General comments (skin integrity, edema, etc.): BP 86/51, once sweatshirt off, BP 96/66 with smaller cuff. Of note, pt just had chemo yesterday which does cause hypotension    Exercises     Assessment/Plan    PT Assessment Patient needs continued PT services  PT Problem List Decreased strength;Decreased mobility;Decreased knowledge of precautions;Decreased activity tolerance;Decreased balance;Decreased knowledge of use of DME       PT Treatment Interventions DME instruction;Therapeutic activities;Gait training;Therapeutic exercise;Patient/family education;Functional mobility training    PT Goals (Current goals can be found in the Care Plan section)  Acute Rehab PT Goals Patient Stated Goal:  go home, not come back PT Goal Formulation: With patient Time For Goal Achievement: 10/04/22 Potential to Achieve Goals: Fair    Frequency Min 3X/week     Co-evaluation PT/OT/SLP Co-Evaluation/Treatment: Yes Reason for Co-Treatment: Complexity of the patient's impairments (multi-system involvement);For patient/therapist safety PT goals addressed during session: Mobility/safety with mobility;Strengthening/ROM OT goals addressed during session: ADL's and self-care       AM-PAC PT "6 Clicks" Mobility  Outcome Measure Help needed turning from your back to your side while in a flat bed without using bedrails?: A Little Help needed moving from lying on your back to sitting on the side of a flat bed without using bedrails?: A Little Help needed moving to and from a bed to a chair (including a wheelchair)?: A Lot Help needed standing up from a chair using your arms (e.g., wheelchair or bedside chair)?: A Lot Help needed to walk in hospital room?: Total Help needed climbing 3-5 steps with a railing? : Total 6 Click Score: 12    End of Session   Activity Tolerance: Patient tolerated treatment well Patient left: with call bell/phone within reach;in bed  Nurse Communication: Mobility status PT Visit Diagnosis: Unsteadiness on feet (R26.81);Muscle weakness (generalized) (M62.81);Repeated falls (R29.6);Pain Pain - Right/Left: Left Pain - part of body: Hip    Time: 3582-5189 PT Time Calculation (min) (ACUTE ONLY): 27 min   Charges:   PT Evaluation $PT Eval Moderate Complexity: [place] chat preferred Office [place] 09/16/2022, 11:44 AM

## 2022-09-16 NOTE — H&P (Signed)
History and Physical    DESHUN SEDIVY TDH:[id number] DOB: November 14, 1945 DOA: 09/15/2022  PCP: Cassandria Anger, MD   Patient coming from: Home   Chief Complaint: General weakness, falls, dysuria, gross hematuria   HPI: [name] is a pleasant 76 y.o. male with medical history significant for psoriasis, COPD, OSA, hypertension, hyperlipidemia, CAD, chronic systolic CHF, atrial fibrillation on Xarelto, recurrent UTI, and diffuse large B-cell lymphoma who presents emergency department with generalized weakness resulting in falls, and also dysuria and gross hematuria.  Patient reports that he typically experiences generalized weakness and fatigue after his cancer treatments, but was more weak and fatigued than usual after his treatment yesterday.  He reports falling 3 times in the past day due to general weakness but denies hitting his head or suffering any significant injury.  Patient was recently treated for Proteus UTI and states that urinary symptoms had resolved, but returned shortly after completing his antibiotic course.  He denies fevers or chills.  ED Course: Upon arrival to the ED, patient is found to be afebrile and saturating well on room air with normal heart rate and systolic blood pressure of 95 and greater.  ED workup notable for red and turbid urine with many bacteria, creatinine increased 1.96, albumin 2.2, normal WBC, and slight thrombocytopenia.  Chest x-ray was negative for acute cardiopulmonary disease.  Urine culture was ordered and the patient was given a liter of normal saline and 2 g cefepime in the ED.  Review of Systems:  All other systems reviewed and apart from HPI, are negative.  Past Medical History:  Diagnosis Date   Benign localized prostatic hyperplasia with lower urinary tract symptoms (LUTS)    CAD (coronary artery disease) cardiologist--  dr t. Oval Linsey   hx of known CAD obstruction w/ collaterals (cath done @ [facility] 12-31-2011) ;   last cardiac  cath 08-13-2019  showed sig. 2V CAD involving proxLAD and CTO of the RCA/  chronic total occlusion midRCA w/ bridging and L>R collaterals   Diastolic CHF ([facility])    dx [phone] hospital admission (followed by pcp)   Diffuse large B cell lymphoma [facility]) oncologist-- dr Irene Limbo--- in remission   dx 08/ 2019 -- bx left tonsill mass-- involving lymph nodes--- completed chemotherapy 10-12-2018   DJD (degenerative joint disease)    Dysthymic disorder    Environmental allergies    GAD (generalized anxiety disorder)    with dysthymia   GERD (gastroesophageal reflux disease)    History of falling    multiple times   History of kidney stones    HLD (hyperlipidemia)    Hyperlipidemia    Hypertension    Hypogonadism in male    OA (osteoarthritis)    OSA on CPAP    cpap machine-settings 17   Osteoporosis with fracture 04/24/2013   Persistent atrial fibrillation ([place])    On Xarelto, cardiologist--- dr Alfonso Patten. [place]   Plaque psoriasis    dermatologist--- dr Harriett Sine--- currently taking otezla   Post-surgical hypothyroidism    followed by pcp---  s/p total thyroidectomy for graves disease in 1987   Wears hearing aid in both ears     Past Surgical History:  Procedure Laterality Date   APPENDECTOMY  2011 approx.    CARDIAC CATHETERIZATION  12-31-2011   dr j. Beatrix Fetters '@[facility]'$    normal LVF w/ multivessel CAD,  occluded RCA w/ colleterals   CATARACT EXTRACTION W/ INTRAOCULAR LENS  IMPLANT, BILATERAL  2016 approx.   COLONOSCOPY     CYSTOSCOPY  WITH RETROGRADE PYELOGRAM, URETEROSCOPY AND STENT PLACEMENT Left 11/07/2017   Procedure: CYSTOSCOPY WITH RETROGRADE PYELOGRAM, URETEROSCOPY AND STENT PLACEMENT;  Surgeon: Cleon Gustin, MD;  Location: [facility];  Service: Urology;  Laterality: Left;   CYSTOSCOPY WITH RETROGRADE PYELOGRAM, URETEROSCOPY AND STENT PLACEMENT Right 10/22/2019   Procedure: CYSTOSCOPY WITH RETROGRADE PYELOGRAM, URETEROSCOPY AND STENT PLACEMENT;  Surgeon: Cleon Gustin, MD;   Location: [facility];  Service: Urology;  Laterality: Right;   CYSTOSCOPY/URETEROSCOPY/HOLMIUM LASER/STENT PLACEMENT Right 03/11/2017   Procedure: CYSTOSCOPY/URETEROSCOPYSTENT PLACEMENT right ureter retrograde pylegram;  Surgeon: Cleon Gustin, MD;  Location[facility];  Service: Urology;  Laterality: Right;   HARDWARE REMOVAL  10/05/2012   Procedure: HARDWARE REMOVAL;  Surgeon: [name], MD;  [facility];  Service: Orthopedics;  Laterality: Right;  REMOVING  STRYKER  GAMMA NAIL   HARDWARE REMOVAL Right 07/03/2013   Procedure: HARDWARE REMOVAL RIGHT TIBIA ;  Surgeon: Rozanna Box, MD;  Location: [place];  Service: Orthopedics;  Laterality: Right;   HIP CLOSED REDUCTION Right 10/15/2013   Procedure: CLOSED MANIPULATION HIP;  Surgeon: [name], MD;  [facility];  Service: Orthopedics;  Laterality: Right;   HOLMIUM LASER APPLICATION Right 03/17/8937   Procedure: HOLMIUM LASER APPLICATION;  Surgeon: Cleon Gustin, MD;  [facility];  Service: Urology;  Laterality: Right;   HOLMIUM LASER APPLICATION Left 10/11/7508   Procedure: HOLMIUM LASER APPLICATION;  Surgeon: Cleon Gustin, MD;  [facility];  Service: Urology;  Laterality: Left;   HOLMIUM LASER APPLICATION Left 11/15/8525   Procedure: HOLMIUM LASER APPLICATION;  Surgeon: Cleon Gustin, MD;  [facility];  Service: Urology;  Laterality: Left;   HOLMIUM LASER APPLICATION Right 7/82/4235   Procedure: HOLMIUM LASER APPLICATION;  Surgeon: Cleon Gustin, MD;  Location: [facility];  Service: Urology;  Laterality: Right;   INCISION AND DRAINAGE HIP Right 11/16/2013   Procedure: IRRIGATION AND DEBRIDEMENT RIGHT HIP;  Surgeon: [name], MD;  [facility];  Service: Orthopedics;  Laterality: Right;   INTRAVASCULAR PRESSURE WIRE/FFR STUDY N/A 08/13/2019   Procedure: INTRAVASCULAR PRESSURE WIRE/FFR STUDY;  Surgeon: Nelva Bush, MD;  Location: [place] [facility];   Service: Cardiovascular;  Laterality: N/A;   IR IMAGING GUIDED PORT INSERTION  06/22/2018   IR NEPHROSTOMY PLACEMENT LEFT  03/28/2019   IR URETERAL STENT LEFT NEW ACCESS W/O SEP NEPHROSTOMY CATH  10/24/2017   IR URETERAL STENT LEFT NEW ACCESS W/O SEP NEPHROSTOMY CATH  03/26/2019   NEPHROLITHOTOMY Right 02/08/2017   Procedure: NEPHROLITHOTOMY PERCUTANEOUS WITH SURGEON ACCESS;  Surgeon: Cleon Gustin, MD;  [facility];  Service: Urology;  Laterality: Right;   NEPHROLITHOTOMY Left 10/24/2017   Procedure: NEPHROLITHOTOMY PERCUTANEOUS;  Surgeon: Cleon Gustin, MD;  [facility];  Service: Urology;  Laterality: Left;   NEPHROLITHOTOMY Left 03/26/2019   Procedure: NEPHROLITHOTOMY PERCUTANEOUS;  Surgeon: Cleon Gustin, MD;  [facility];  Service: Urology;  Laterality: Left;  2 HRS   NEPHROLITHOTOMY Left 04/17/2019   Procedure: NEPHROLITHOTOMY PERCUTANEOUS;  Surgeon: Cleon Gustin, MD;  [facility];  Service: Urology;  Laterality: Left;  2 HRS   ORIF TIBIA FRACTURE Right 02/06/2013   Procedure: OPEN REDUCTION INTERNAL FIXATION (ORIF) TIBIA FRACTURE WITH IM ROD FIBULA;  Surgeon: Rozanna Box, MD;  Location: [place];  Service: Orthopedics;  Laterality: Right;   ORIF TIBIA FRACTURE Right 07/03/2013   Procedure: RIGHT TIBIA NON UNION REPAIR ;  Surgeon: [name]  Shari Heritage, MD;  Location: [place];  Service: Orthopedics;  Laterality: Right;   ORIF WRIST FRACTURE  10/02/2012   Procedure: OPEN REDUCTION INTERNAL FIXATION (ORIF) WRIST FRACTURE;  Surgeon: Roseanne Kaufman, MD;  [facility];  Service: Orthopedics;  Laterality: Right;  WITH   ANTIBIOTIC  CEMENT   ORIF WRIST FRACTURE Left 10/28/2013   Procedure: OPEN REDUCTION INTERNAL FIXATION (ORIF) WRIST FRACTURE with allograft;  Surgeon: Roseanne Kaufman, MD;  [facility];  Service: Orthopedics;  Laterality: Left;  DVR Plate   QUADRICEPS TENDON REPAIR Left 07/15/2017   Procedure: REPAIR QUADRICEP TENDON;  Surgeon: Frederik Pear, MD;   Location: [place];  Service: Orthopedics;  Laterality: Left;   RIGHT/LEFT HEART CATH AND CORONARY ANGIOGRAPHY N/A 08/13/2019   Procedure: RIGHT/LEFT HEART CATH AND CORONARY ANGIOGRAPHY;  Surgeon: Nelva Bush, MD;  Location: [place] [facility];  Service: Cardiovascular;  Laterality: N/A;   THYROIDECTOMY  02/1986   TOTAL HIP ARTHROPLASTY Right 03-16-2016   '@[facility]'$    TOTAL HIP REVISION  10/05/2012   Procedure: TOTAL HIP REVISION;  Surgeon: [name], MD;  [facility];  Service: Orthopedics;  Laterality: Right;  RIGHT TOTAL HIP REVISION   TOTAL HIP REVISION Right 09/17/2013   Procedure: REVISION RIGHT TOTAL HIP ARTHROPLASTY ;  Surgeon: [name], MD;  [facility];  Service: Orthopedics;  Laterality: Right;   TOTAL HIP REVISION Right 10/26/2013   Procedure: REVISION RIGHT TOTAL HIP ARTHROPLASTY;  Surgeon: [name], MD;  [facility];  Service: Orthopedics;  Laterality: Right;   TOTAL KNEE ARTHROPLASTY Bilateral right 03-15-2011;  left 06-30-2011   TOTAL KNEE REVISION Left 04/11/2017   Procedure: TOTAL KNEE REVISION PATELLA and TIBIA;  Surgeon: Frederik Pear, MD;  Location: [place];  Service: Orthopedics;  Laterality: Left;    Social History:   reports that he quit smoking about 10 years ago. His smoking use included cigarettes. He has a 50.00 pack-year smoking history. He has never used smokeless tobacco. He reports current alcohol use. He reports that he does not use drugs.  Allergies  Allergen Reactions   Short Ragweed Pollen Ext Cough    Family History  Problem Relation Age of Onset   CAD Father 63   Asthma Father    Alcohol abuse Father    Arthritis Mother    Alcohol abuse Sister      Prior to Admission medications   Medication Sig Start Date End Date Taking? Authorizing Provider  acetaminophen (TYLENOL) 325 MG tablet Take 2 tablets (650 mg total) by mouth every 6 (six) hours as needed for mild pain (or Fever >/= 101). 03/25/22   Raiford Noble Latif, DO   acyclovir (ZOVIRAX) 400 MG tablet Take 400 mg by mouth 2 (two) times daily. 08/11/22   [provider]  albuterol (VENTOLIN HFA) 108 (90 Base) MCG/ACT inhaler Inhale 2 puffs into the lungs every 6 (six) hours as needed for wheezing or shortness of breath.    [provider]  alfuzosin (UROXATRAL) 10 MG 24 hr tablet TAKE 1 TABLET AT BEDTIME 08/10/22   McKenzie, Candee Furbish, MD  allopurinol (ZYLOPRIM) 100 MG tablet Take 1 tablet (100 mg total) by mouth 2 (two) times daily. Patient not taking: Reported on 09/05/2022 06/30/22   Tommy Medal, Lavell Islam, MD  allopurinol (ZYLOPRIM) 300 MG tablet Take 300 mg by mouth daily. 08/11/22 09/10/22  [provider]  atorvastatin (LIPITOR) 80 MG tablet TAKE 1 TABLET EVERY DAY 08/09/22   Loel Dubonnet,  NP  buPROPion (WELLBUTRIN XL) 300 MG 24 hr tablet TAKE 1 TABLET EVERY DAY WITH BREAKFAST Patient taking differently: Take 300 mg by mouth daily. 07/19/22   Plotnikov, Evie Lacks, MD  Calcium Citrate-Vitamin D (CALCIUM CITRATE + D PO) Take 1 tablet by mouth 2 (two) times daily. '1200mg'$  of Calcium and 1000 units vitamin D3    [provider]  Cholecalciferol (VITAMIN D) 50 MCG (2000 UT) tablet Take 4,000 Units by mouth daily.    [provider]  clonazePAM (KLONOPIN) 1 MG tablet Take 1 tablet (1 mg total) by mouth 3 (three) times daily as needed for anxiety. 08/23/22   Plotnikov, Evie Lacks, MD  CRANBERRY PO Take 1 tablet by mouth in the morning and at bedtime.    [provider]  denosumab (PROLIA) 60 MG/ML SOSY injection Inject 60 mg as directed every 6 (six) months. 12/10/15   [provider]  DULoxetine (CYMBALTA) 60 MG capsule TAKE 1 CAPSULE TWICE DAILY Patient taking differently: Take 60 mg by mouth 2 (two) times daily. 07/19/22   Plotnikov, Evie Lacks, MD  EPINEPHrine 0.3 mg/0.3 mL IJ SOAJ injection Inject 0.3 mg into the muscle once as needed for anaphylaxis (severe allergic reaction). 07/31/21   [provider]  feeding supplement (ENSURE ENLIVE / ENSURE PLUS) LIQD Take 237 mLs by mouth 2 (two) times daily between meals. Patient taking differently: Take 237 mLs by mouth daily. 03/25/22   Raiford Noble Latif, DO  ferrous sulfate 325 (65 FE) MG tablet Take 325 mg by mouth 2 (two) times daily with a meal.    [provider]  HYDROcodone-acetaminophen (NORCO) 7.5-325 MG tablet Take 1 tablet by mouth every 6 (six) hours as needed for severe pain. Patient not taking: Reported on 09/05/2022 03/26/22   Raiford Noble Latif, DO  levothyroxine (SYNTHROID) 125 MCG tablet Take 1 tablet (125 mcg total) by mouth daily at 6 (six) AM. 08/23/22   Plotnikov, Evie Lacks, MD  lidocaine-prilocaine (EMLA) cream Apply 1 Application topically as needed. Patient taking differently: Apply 1 Application topically as needed (For port access/chemotherapy). 04/21/22   Brunetta Genera, MD  loratadine (CLARITIN) 10 MG tablet Take 10 mg by mouth daily.    [provider]  Magnesium 250 MG TABS Take 1 tablet by mouth daily.    [provider]  Magnesium 500 MG CAPS Take 1 capsule (500 mg total) by mouth daily. Patient taking differently: Take 500 mg by mouth daily with supper. 10/13/18   Brunetta Genera, MD  metoprolol tartrate (LOPRESSOR) 25 MG tablet Take 25 mg by mouth 2 (two) times daily.    [provider]  Multiple Vitamin (MULTIVITAMIN WITH MINERALS) TABS tablet Take 1 tablet by mouth every morning. Men's One-A-Day 50+    [provider]  MYRBETRIQ 25 MG TB24 tablet TAKE 1 TABLET EVERY DAY Patient taking differently: Take 25 mg by mouth at bedtime. 10/06/21   McKenzie, Candee Furbish, MD  nitroGLYCERIN (NITROSTAT) 0.4 MG SL tablet Place 0.4 mg under the tongue every 5 (five) minutes as needed for chest pain.    [provider]  omeprazole (PRILOSEC) 40 MG capsule TAKE 1 CAPSULE EVERY DAY BEFORE BREAKFAST Patient taking differently: Take 40 mg by mouth daily.  07/19/22   Plotnikov, Evie Lacks, MD  Potassium Citrate 15 MEQ (1620 MG) TBCR Take 1 tablet by mouth See admin instructions. Take one tablet (15 meq) by mouth twice daily - with lunch and supper Patient taking differently: Take 1  tablet by mouth in the morning and at bedtime. 06/10/22   McKenzie, Candee Furbish, MD  Probiotic Product (PROBIOTIC PO) Take 1 capsule by mouth daily with breakfast.    [provider]  Risankizumab-rzaa (SKYRIZI, 150 MG DOSE, [facility]) Inject 150 mg into the skin every 4 (four) months.    [provider]  rivaroxaban (XARELTO) 20 MG TABS tablet Take 1 tablet (20 mg total) by mouth daily with supper. 05/26/22   Loel Dubonnet, NP  testosterone enanthate (DELATESTRYL) 200 MG/ML injection INJECT 1ML ('200MG'$ ) INTO THE MUSCLE EVERY 14 DAYS. DISCARD VIAL AFTER 28 DAYS. Patient taking differently: Inject 200 mg into the muscle every 14 (fourteen) days. Every other Friday 09/22/21   Plotnikov, Evie Lacks, MD  vitamin C (ASCORBIC ACID) 500 MG tablet Take 500 mg by mouth daily with lunch.    [provider]    Physical Exam: Vitals:   09/15/22 2219 09/15/22 2226 09/16/22 0220 09/16/22 0302  BP: (!) 95/50  (!) 95/57 114/84  Pulse: 77  72   Resp: 18  16   Temp:  97.6 F (36.4 C)    TempSrc:  Oral    SpO2: 97%  100%   Weight: 103.9 kg     Height: '6\' 4"'$  (1.93 m)       Constitutional: NAD, calm  Eyes: PERTLA, lids and conjunctivae normal ENMT: Mucous membranes are moist. Posterior pharynx clear of any exudate or lesions.   Neck: supple, no masses  Respiratory:  no wheezing, no crackles. No accessory muscle use.  Cardiovascular: S1 & S2 heard, regular rate and rhythm. No JVD. Abdomen: No distension, no tenderness, soft. Bowel sounds active.  Musculoskeletal: no clubbing / cyanosis. Distal RLE deformity.   Skin: Left hand ecchymosis and skin tear. Warm, dry, well-perfused. Neurologic: CN 2-12 grossly intact. Strength 5/5 in all 4 limbs. Alert and oriented.   Psychiatric: Pleasant. Cooperative.    Labs and Imaging on Admission: I have personally reviewed following labs and imaging studies  CBC: Recent Labs  Lab 09/16/22 0127  WBC 7.5  NEUTROABS 5.4  HGB 12.4*  HCT 37.4*  MCV 94.2  PLT 914*   Basic Metabolic Panel: Recent Labs  Lab 09/16/22 0127  NA 140  K 4.7  CL 106  CO2 23  GLUCOSE 107*  BUN 38*  CREATININE 1.96*  CALCIUM 8.2*   GFR: Estimated Creatinine Clearance: 39.4 mL/min (A) (by C-G formula based on SCr of 1.96 mg/dL (H)). Liver Function Tests: Recent Labs  Lab 09/16/22 0127  AST 26  ALT 28  ALKPHOS 47  BILITOT 0.6  PROT 4.4*  ALBUMIN 2.2*   No results for input(s): "LIPASE", "AMYLASE" in the last 168 hours. No results for input(s): "AMMONIA" in the last 168 hours. Coagulation Profile: No results for input(s): "INR", "PROTIME" in the last 168 hours. Cardiac Enzymes: No results for input(s): "CKTOTAL", "CKMB", "CKMBINDEX", "TROPONINI" in the last 168 hours. BNP (last 3 results) No results for input(s): "PROBNP" in the last 8760 hours. HbA1C: No results for input(s): "HGBA1C" in the last 72 hours. CBG: No results for input(s): "GLUCAP" in the last 168 hours. Lipid Profile: No results for input(s): "CHOL", "HDL", "LDLCALC", "TRIG", "CHOLHDL", "LDLDIRECT" in the last 72 hours. Thyroid Function Tests: No results for input(s): "TSH", "T4TOTAL", "FREET4", "T3FREE", "THYROIDAB" in the last 72 hours. Anemia Panel: No results for input(s): "VITAMINB12", "FOLATE", "FERRITIN", "TIBC", "IRON", "RETICCTPCT" in the last 72 hours. Urine analysis:    Component Value Date/Time   COLORURINE RED (  A) 09/16/2022 0335   APPEARANCEUR TURBID (A) 09/16/2022 0335   LABSPEC  09/16/2022 0335    TEST NOT REPORTED DUE TO COLOR INTERFERENCE OF URINE PIGMENT   PHURINE  09/16/2022 0335    TEST NOT REPORTED DUE TO COLOR INTERFERENCE OF URINE PIGMENT   GLUCOSEU (A) 09/16/2022 0335    TEST NOT REPORTED DUE TO COLOR INTERFERENCE  OF URINE PIGMENT   GLUCOSEU NEGATIVE 10/02/2018 1235   HGBUR (A) 09/16/2022 0335    TEST NOT REPORTED DUE TO COLOR INTERFERENCE OF URINE PIGMENT   BILIRUBINUR (A) 09/16/2022 0335    TEST NOT REPORTED DUE TO COLOR INTERFERENCE OF URINE PIGMENT   KETONESUR (A) 09/16/2022 0335    TEST NOT REPORTED DUE TO COLOR INTERFERENCE OF URINE PIGMENT   PROTEINUR (A) 09/16/2022 0335    TEST NOT REPORTED DUE TO COLOR INTERFERENCE OF URINE PIGMENT   UROBILINOGEN 0.2 10/02/2018 1235   NITRITE (A) 09/16/2022 0335    TEST NOT REPORTED DUE TO COLOR INTERFERENCE OF URINE PIGMENT   LEUKOCYTESUR (A) 09/16/2022 0335    TEST NOT REPORTED DUE TO COLOR INTERFERENCE OF URINE PIGMENT   Sepsis Labs: '@LABRCNTIP'$ (procalcitonin:4,lacticidven:4) )No results found for this or any previous visit (from the past 240 hour(s)).   Radiological Exams on Admission: DG Chest Port 1 View  Result Date: 09/16/2022 CLINICAL DATA:  Weakness EXAM: PORTABLE CHEST 1 VIEW COMPARISON:  09/05/2022 FINDINGS: Right Port-A-Cath remains in place, unchanged. Heart and mediastinal contours are within normal limits. No focal opacities or effusions. No acute bony abnormality. IMPRESSION: No active cardiopulmonary disease. Electronically Signed   By: Rolm Baptise M.D.   On: 09/16/2022 00:53    EKG: Independently reviewed. Atrial fibrillation, LAD.   Assessment/Plan   1. AKI  - SCr is 1.96 on admission, up from baseline of 1.1   - He has gross hematuria  - He was given 1 liter NS in ED  - Check renal US, renally-dose medications, repeat chem panel    2. General weakness  - Likely related to chemo on 12/6 and/or UTI    - Check CK, B12, mag, and phosphorus, continue supportive care, consult PT    3. Recurrent UTI   - Reports recurrent dysuria and gross hematuria  - Not septic on admission  - Started on cefepime in ED - Continue cefepime for now, follow cultures  4. Chronic systolic CHF  - Appears compensated   - Continue beta-blocker,  monitor volume status    5. Diffuse large B-cell lymphoma - Completed R-CHOP in 2020, had recurrence in April 2023, currently on epcoritamab with last dose 09/15/22    6. Atrial fibrillation  - Continue Xarelto and metoprolol    7. COPD; OSA  - Not in COPD exacerbation on admission  - Continue CPAP qHS and prn albuterol    8. Hypothyroidism  - Continue Synthroid   9. Depression, anxiety  - Continue Wellbutrin, Cymbalta, Klonopin     DVT prophylaxis: Xarelto  Code Status: DNR  Level of Care: Level of care: Med-Surg Family Communication: none present  Disposition Plan:  Patient is from: home  Anticipated d/c is to: TBD Anticipated d/c date is: 12/8 or 09/18/22  Patient currently: Pending PT eval, transition to oral antibiotic  Consults called: none  Admission status: Observation     Vianne Bulls, MD [facility]  09/16/2022, 4:57 AM

## 2022-09-16 NOTE — Telephone Encounter (Signed)
Rush Farmer called on behalf of the pt, stating [name] has been admitted to [facility]. They are currently running test regarding blood in his urine and concerns of his blood pressure. He will reschedule appt at a later date. He wanted to make sure Dr. Tommy Medal was aware of the situation.

## 2022-09-16 NOTE — Telephone Encounter (Signed)
Okay.  Thanks.

## 2022-09-16 NOTE — ED Notes (Signed)
Patient transported to Ultrasound 

## 2022-09-16 NOTE — Telephone Encounter (Signed)
Called April no answer LMOM w/MD response.Marland KitchenJohny Chess

## 2022-09-16 NOTE — Consult Note (Signed)
Date of Admission:  09/15/2022          Reason for Consult: Hypotension, falls, ? Another UTI    Referring Provider: Charlynne Cousins, MD   Assessment:  Hypotension, weakness--which could be multifactorial with possible causes being:  Epkinly infusion yesterday at Oncology and cytokine storm --though he already was experiencing dizziness and shakiness upon standing prior to infusion  Recurrent UTI's--patient has endorsed hematuria but more importantly some dysuria (though has been intermittent) Paroxysmal atrial fibrillation Frequent falls Wound on thigh where he had radiation Hematuria Diffuse B-cell lymphoma Paroxysmal atrial fibrillation Acute kidney injury Coronary artery disease  Plan:  Reasonable continue cefepime for now while awaiting blood and urine culture data IVF and supportive care Wound care consult  Principal Problem:   AKI (acute kidney injury) ([place]) Active Problems:   Hypothyroidism   CAD (coronary artery disease)   Chronic obstructive pulmonary disease (HCC)   Diffuse large B-cell lymphoma ([place])   Chronic systolic CHF (congestive heart failure) (HCC)   OSA (obstructive sleep apnea)   Paroxysmal atrial fibrillation (HCC)   Recurrent UTI   Gross hematuria   Acute kidney injury ([place])   Scheduled Meds:  acyclovir  400 mg Oral BID   allopurinol  100 mg Oral BID   ascorbic acid  500 mg Oral Q lunch   atorvastatin  80 mg Oral Daily   buPROPion  300 mg Oral Daily   DULoxetine  60 mg Oral BID   ezetimibe  10 mg Oral QHS   levothyroxine  125 mcg Oral Q0600   metoprolol tartrate  25 mg Oral BID   pantoprazole  40 mg Oral Daily   rivaroxaban  20 mg Oral Q supper   Continuous Infusions:  sodium chloride 75 mL/hr at 09/16/22 1034   ceFEPime (MAXIPIME) IV     PRN Meds:.acetaminophen **OR** acetaminophen, albuterol, clonazePAM, ondansetron **OR** ondansetron (ZOFRAN) IV, oxyCODONE  HPI: [name] is a 76 y.o. male I know from ID  clinic.  Has a history of coronary disease recurrent diffuse B-cell lymphoma with history of renal calculi including a staghorn calculus, dismal atrial fibrillation problems with urinary incontinence and also recurrent urinary tract infections.  It is severe urinary tract infection in July 2023 with ESBL.  He was referred by primary care to me in I have followed him fairly closely in the clinic.  I have given him several courses of antibiotics including for ESBL with ertapenem.  Noticing clear-cut UTIs has been not so simple with him.  He has a history of frequent falls and problems with low blood pressure orthostasis and is fairly frail.  At times he has had dysuria but other times he has developed confusion and then worsening lethargy as a manifestation of his infection.  His more recent urine cultures have not grown very impressive colony counts of organisms and fortunately have been less resistant bacteria.  He has recently started Odin (though I thought he was on CAR-T cell therapy.  This drug can cause a similar effect of cytokine storm that can be seen CAR-T cell therapy.  Seen and oncology yesterday [facility] where he received his Epkinly infusion.  The notes in [place] show that did already have symptoms of dizziness and lightheadedness and weakness upon standing prior to infusion of his [place].  He was given boluses of normal saline afterwards and discharged to home.  Apparently then fell 3 additional times in the past day.  Came to the ER where he was initially found to be afebrile.  He was also endorsing intermittent dysuria.  Labs significant for acute kidney injury with a creatinine up to 1.96.  He also has some mild thrombocytopenia.  CT renal shows a 3 mm nonobstructing left renal calculus with stable right adrenal adenoma, left renal cyst and a stable urinary bladder diverticulum with some lateral urinary bladder wall thickening stable  compression fracture and  UA shows pyuria and urine culture was taken along with blood cultures.  He was started on cefepime.  When I saw him in the ER he was having issues with low blood pressure.  He has of note some significant pain at site on his left thigh where he has had radiation injury apparently.  Continue cefepime for now is reasonable and we will follow along with you.  Would obtain a wound care consult for his left hip wound.  I spent 84 minutes with the patient including than 50% of the time in face to face counseling of the patient regarding his recurrent UTIs his B-cell lymphoma with Epkinley infusions his frequent falls, including non-face-to-face time personally reviewing CT renal study, along with review of medical records in preparation for the visit and during the visit and in coordination of nis care.    Review of Systems: Review of Systems  Constitutional:  Positive for malaise/fatigue. Negative for chills, fever and weight loss.  HENT:  Negative for congestion and sore throat.   Eyes:  Negative for blurred vision and photophobia.  Respiratory:  Negative for cough, shortness of breath and wheezing.   Cardiovascular:  Negative for chest pain, palpitations and leg swelling.  Gastrointestinal:  Negative for abdominal pain, blood in stool, constipation, diarrhea, heartburn, melena, nausea and vomiting.  Genitourinary:  Positive for dysuria and hematuria. Negative for flank pain.  Musculoskeletal:  Negative for back pain, falls, joint pain and myalgias.  Skin:  Negative for itching and rash.  Neurological:  Positive for dizziness and weakness. Negative for focal weakness, loss of consciousness and headaches.  Endo/Heme/Allergies:  Does not bruise/bleed easily.  Psychiatric/Behavioral:  Negative for depression and suicidal ideas. The patient does not have insomnia.     Past Medical History:  Diagnosis Date   Benign localized prostatic hyperplasia with lower urinary  tract symptoms (LUTS)    CAD (coronary artery disease) cardiologist--  dr t. Oval Linsey   hx of known CAD obstruction w/ collaterals (cath done @ [facility] 12-31-2011) ;   last cardiac cath 08-13-2019  showed sig. 2V CAD involving proxLAD and CTO of the RCA/  chronic total occlusion midRCA w/ bridging and L>R collaterals   Diastolic CHF ([place])    dx 01-75-1025 hospital admission (followed by pcp)   Diffuse large B cell lymphoma [facility]) oncologist-- dr Irene Limbo--- in remission   dx 08/ 2019 -- bx left tonsill mass-- involving lymph nodes--- completed chemotherapy 10-12-2018   DJD (degenerative joint disease)    Dysthymic disorder    Environmental allergies    GAD (generalized anxiety disorder)    with dysthymia   GERD (gastroesophageal reflux disease)    History of falling    multiple times   History of kidney stones    HLD (hyperlipidemia)    Hyperlipidemia    Hypertension    Hypogonadism in male    OA (osteoarthritis)    OSA on CPAP    cpap machine-settings 17   Osteoporosis with fracture 04/24/2013   Persistent atrial fibrillation (HCC)    On Xarelto,  cardiologist--- dr Alfonso Patten. [place]   Plaque psoriasis    dermatologist--- dr Harriett Sine--- currently taking otezla   Post-surgical hypothyroidism    followed by pcp---  s/p total thyroidectomy for graves disease in 1987   Wears hearing aid in both ears     Social History   Tobacco Use   Smoking status: Former    Packs/day: 1.00    Years: 50.00    Total pack years: 50.00    Types: Cigarettes    Quit date: 01/12/2012    Years since quitting: 10.6   Smokeless tobacco: Never  Vaping Use   Vaping Use: Never used  Substance Use Topics   Alcohol use: Yes    Comment: rarely   Drug use: Never    Family History  Problem Relation Age of Onset   CAD Father 2   Asthma Father    Alcohol abuse Father    Arthritis Mother    Alcohol abuse Sister    Allergies  Allergen Reactions   Short Ragweed Pollen Ext Cough    OBJECTIVE: Blood  pressure (!) 98/50, pulse 73, temperature 97.9 F (36.6 C), temperature source Oral, resp. rate 19, height '6\' 4"'$  (1.93 m), weight 103.9 kg, SpO2 99 %.  Physical Exam Constitutional:      Appearance: He is well-developed.  HENT:     Head: Normocephalic and atraumatic.  Eyes:     Conjunctiva/sclera: Conjunctivae normal.  Cardiovascular:     Rate and Rhythm: Normal rate and regular rhythm.  Pulmonary:     Effort: Pulmonary effort is normal. No respiratory distress.     Breath sounds: No wheezing.  Abdominal:     General: There is no distension.     Palpations: Abdomen is soft.  Musculoskeletal:        General: No tenderness. Normal range of motion.     Cervical back: Normal range of motion and neck supple.  Skin:    General: Skin is warm and dry.     Coloration: Skin is pale.     Findings: Bruising present. No erythema or rash.  Neurological:     General: No focal deficit present.     Mental Status: He is alert and oriented to person, place, and time.  Psychiatric:        Mood and Affect: Mood normal.        Behavior: Behavior normal.        Thought Content: Thought content normal.        Judgment: Judgment normal.     Lab Results Lab Results  Component Value Date   WBC 6.2 09/16/2022   HGB 11.8 (L) 09/16/2022   HCT 36.8 (L) 09/16/2022   MCV 95.6 09/16/2022   PLT 149 (L) 09/16/2022    Lab Results  Component Value Date   CREATININE 1.84 (H) 09/16/2022   BUN 38 (H) 09/16/2022   NA 137 09/16/2022   K 4.5 09/16/2022   CL 104 09/16/2022   CO2 26 09/16/2022    Lab Results  Component Value Date   ALT 28 09/16/2022   AST 26 09/16/2022   ALKPHOS 47 09/16/2022   BILITOT 0.6 09/16/2022     Microbiology: No results found for this or any previous visit (from the past 240 hour(s)).  Alcide Evener, [place] [facility] [phone] pager  09/16/2022, 10:58 AM

## 2022-09-17 DIAGNOSIS — I5022 Chronic systolic (congestive) heart failure: Secondary | ICD-10-CM | POA: Diagnosis not present

## 2022-09-17 DIAGNOSIS — I959 Hypotension, unspecified: Secondary | ICD-10-CM

## 2022-09-17 DIAGNOSIS — N3001 Acute cystitis with hematuria: Secondary | ICD-10-CM | POA: Diagnosis not present

## 2022-09-17 DIAGNOSIS — D89839 Cytokine release syndrome, grade unspecified: Secondary | ICD-10-CM | POA: Diagnosis not present

## 2022-09-17 DIAGNOSIS — C8330 Diffuse large B-cell lymphoma, unspecified site: Secondary | ICD-10-CM

## 2022-09-17 DIAGNOSIS — I2510 Atherosclerotic heart disease of native coronary artery without angina pectoris: Secondary | ICD-10-CM | POA: Diagnosis not present

## 2022-09-17 DIAGNOSIS — I480 Paroxysmal atrial fibrillation: Secondary | ICD-10-CM | POA: Diagnosis not present

## 2022-09-17 DIAGNOSIS — N179 Acute kidney failure, unspecified: Secondary | ICD-10-CM | POA: Diagnosis not present

## 2022-09-17 LAB — CBC
HCT: 36.1 % — ABNORMAL LOW (ref 39.0–52.0)
Hemoglobin: 12.2 g/dL — ABNORMAL LOW (ref 13.0–17.0)
MCH: 31.3 pg (ref 26.0–34.0)
MCHC: 33.8 g/dL (ref 30.0–36.0)
MCV: 92.6 fL (ref 80.0–100.0)
Platelets: 134 10*3/uL — ABNORMAL LOW (ref 150–400)
RBC: 3.9 MIL/uL — ABNORMAL LOW (ref 4.22–5.81)
RDW: 20.3 % — ABNORMAL HIGH (ref 11.5–15.5)
WBC: 6.6 10*3/uL (ref 4.0–10.5)
nRBC: 0 % (ref 0.0–0.2)

## 2022-09-17 LAB — URINE CULTURE: Culture: >=100000 — AB

## 2022-09-17 LAB — BASIC METABOLIC PANEL
Anion gap: 9 (ref 5–15)
BUN: 32 mg/dL — ABNORMAL HIGH (ref 8–23)
CO2: 23 mmol/L (ref 22–32)
Calcium: 7.9 mg/dL — ABNORMAL LOW (ref 8.9–10.3)
Chloride: 102 mmol/L (ref 98–111)
Creatinine, Ser: 1.62 mg/dL — ABNORMAL HIGH (ref 0.61–1.24)
GFR, Estimated: 43 mL/min — ABNORMAL LOW (ref >60)
Glucose, Bld: 82 mg/dL (ref 70–99)
Potassium: 4.1 mmol/L (ref 3.5–5.1)
Sodium: 134 mmol/L — ABNORMAL LOW (ref 135–145)

## 2022-09-17 MED ORDER — ZINC OXIDE 40 % EX OINT
TOPICAL_OINTMENT | Freq: Every day | CUTANEOUS | Status: DC
  Filled 2022-09-17 (×2): qty 57

## 2022-09-17 MED ORDER — SODIUM CHLORIDE 0.9 % IV SOLN: INTRAVENOUS | Status: DC

## 2022-09-17 MED ORDER — SODIUM CHLORIDE 0.9 % IV SOLN: INTRAVENOUS | Status: AC

## 2022-09-17 MED ORDER — JUVEN PO PACK
1.0000 | PACK | Freq: Two times a day (BID) | ORAL | Status: DC
  Administered 2022-09-17 – 2022-09-23 (×11): 1 via ORAL
  Filled 2022-09-17 (×10): qty 1

## 2022-09-17 NOTE — Progress Notes (Signed)
Subjective: No new complaints   Antibiotics:  Anti-infectives (From admission, onward)    Start     Dose/Rate Route Frequency Ordered Stop   09/17/22 0400  ceFEPIme (MAXIPIME) 2 g in sodium chloride 0.9 % 100 mL IVPB  Status:  Discontinued        2 g 200 mL/hr over 30 Minutes Intravenous Every 24 hours 09/16/22 0515 09/16/22 0745   09/16/22 1700  ceFEPIme (MAXIPIME) 2 g in sodium chloride 0.9 % 100 mL IVPB  Status:  Discontinued        2 g 200 mL/hr over 30 Minutes Intravenous Every 12 hours 09/16/22 0745 09/17/22 1207   09/16/22 1000  acyclovir (ZOVIRAX) tablet 400 mg        400 mg Oral 2 times daily 09/16/22 0457     09/16/22 0430  ceFEPIme (MAXIPIME) 2 g in sodium chloride 0.9 % 100 mL IVPB        2 g 200 mL/hr over 30 Minutes Intravenous  Once 09/16/22 0421 09/16/22 0553       Medications: Scheduled Meds:  acyclovir  400 mg Oral BID   allopurinol  100 mg Oral BID   ascorbic acid  500 mg Oral Q lunch   atorvastatin  80 mg Oral Daily   buPROPion  300 mg Oral Daily   DULoxetine  60 mg Oral BID   ezetimibe  10 mg Oral QHS   feeding supplement  237 mL Oral BID BM   levothyroxine  125 mcg Oral Q0600   metoprolol tartrate  25 mg Oral BID   pantoprazole  40 mg Oral Daily   Continuous Infusions:  sodium chloride 75 mL/hr at 09/17/22 1039   PRN Meds:.acetaminophen **OR** acetaminophen, albuterol, clonazePAM, ondansetron **OR** ondansetron (ZOFRAN) IV, oxyCODONE    Objective: Weight change: 4.726 kg  Intake/Output Summary (Last 24 hours) at 09/17/2022 1329 Last data filed at 09/17/2022 0900 Gross per 24 hour  Intake --  Output 1201 ml  Net -1201 ml   Blood pressure (!) 95/45, pulse 78, temperature 97.7 F (36.5 C), temperature source Oral, resp. rate 16, height '6\' 4"'$  (1.93 m), weight 108.6 kg, SpO2 99 %. Temp:  [97.4 F (36.3 C)-98.9 F (37.2 C)] 97.7 F (36.5 C) (12/08 0800) Pulse Rate:  [78-93] 78 (12/08 0256) Resp:  [16-23] 16 (12/08 0800) BP:  (95-112)/(45-88) 95/45 (12/08 0800) SpO2:  [98 %-100 %] 99 % (12/08 0800) Weight:  [108.6 kg] 108.6 kg (12/08 0256)  Physical Exam: Physical Exam Constitutional:      Appearance: He is well-developed.  HENT:     Head: Normocephalic and atraumatic.  Eyes:     Conjunctiva/sclera: Conjunctivae normal.  Cardiovascular:     Rate and Rhythm: Normal rate. Rhythm irregular.  Pulmonary:     Effort: Pulmonary effort is normal. No respiratory distress.     Breath sounds: Normal breath sounds. No stridor. No wheezing.  Abdominal:     General: There is no distension.     Palpations: Abdomen is soft.  Musculoskeletal:        General: Normal range of motion.     Cervical back: Normal range of motion and neck supple.  Skin:    General: Skin is warm and dry.  Neurological:     General: No focal deficit present.     Mental Status: He is alert and oriented to person, place, and time.  Psychiatric:        Mood and Affect: Mood normal.  Behavior: Behavior normal.        Thought Content: Thought content normal.        Judgment: Judgment normal.      CBC:    BMET Recent Labs    09/16/22 0557 09/17/22 0749  NA 137 134*  K 4.5 4.1  CL 104 102  CO2 26 23  GLUCOSE 103* 82  BUN 38* 32*  CREATININE 1.84* 1.62*  CALCIUM 7.9* 7.9*     Liver Panel  Recent Labs    09/16/22 0127  PROT 4.4*  ALBUMIN 2.2*  AST 26  ALT 28  ALKPHOS 47  BILITOT 0.6       Sedimentation Rate No results for input(s): "ESRSEDRATE" in the last 72 hours. C-Reactive Protein No results for input(s): "CRP" in the last 72 hours.  Micro Results: Recent Results (from the past 720 hour(s))  Urine Culture     Status: None   Collection Time: 08/31/22 11:08 AM   Specimen: Urine  Result Value Ref Range Status   MICRO NUMBER: [id number]  Final   SPECIMEN QUALITY: Adequate  Final   Sample Source URINE, CLEAN CATCH  Final   STATUS: FINAL  Final   Result:   Final    Less than 10,000 CFU/mL of single  Gram negative organism isolated. No further testing will be performed. If clinically indicated, recollection using a method to minimize contamination, with prompt transfer to Urine Culture Transport Tube, is recommended.  MICROSCOPIC MESSAGE     Status: None   Collection Time: 08/31/22 11:08 AM  Result Value Ref Range Status   Note   Final    Comment: This urine was analyzed for the presence of WBC,  RBC, bacteria, casts, and other formed elements.  Only those elements seen were reported. . .   Blood Culture (routine x 2)     Status: None   Collection Time: 09/05/22  5:55 PM   Specimen: BLOOD RIGHT ARM  Result Value Ref Range Status   Specimen Description   Final    BLOOD RIGHT ARM Performed at [facility], [address] [place], [place] [address]    Special Requests   Final    BOTTLES DRAWN AEROBIC AND ANAEROBIC Blood Culture adequate volume Performed at [place] [address] [place], [place] [address]    Culture   Final    NO GROWTH 5 DAYS Performed at [facility], [place] [address], [place], [place] [address]    Report Status 09/10/2022 FINAL  Final  Resp Panel by RT-PCR (Flu A&B, Covid) Anterior Nasal Swab     Status: None   Collection Time: 09/05/22  6:54 PM   Specimen: Anterior Nasal Swab  Result Value Ref Range Status   SARS Coronavirus 2 by RT PCR NEGATIVE NEGATIVE Final    Comment: (NOTE) SARS-CoV-2 target nucleic acids are NOT DETECTED.  The SARS-CoV-2 RNA is generally detectable in upper respiratory specimens during the acute phase of infection. The lowest concentration of SARS-CoV-2 viral copies this assay can detect is 138 copies/mL. A negative result does not preclude SARS-Cov-2 infection and should not be used as the sole basis for treatment or other patient management decisions. A negative result may occur with  improper specimen collection/handling, submission of specimen other than nasopharyngeal swab, presence of  viral mutation(s) within the areas targeted by this assay, and inadequate number of viral copies(<138 copies/mL). A negative result must be combined with clinical observations, patient history, and epidemiological information.  The expected result is Negative.  Fact Sheet for Patients:  EntrepreneurPulse.com.au  Fact Sheet for Healthcare Providers:  IncredibleEmployment.be  This test is no t yet approved or cleared by the [place] FDA and  has been authorized for detection and/or diagnosis of SARS-CoV-2 by FDA under an Emergency Use Authorization (EUA). This EUA will remain  in effect (meaning this test can be used) for the duration of the COVID-19 declaration under Section 564(b)(1) of the Act, 21 U.S.C.section 360bbb-3(b)(1), unless the authorization is terminated  or revoked sooner.       Influenza A by PCR NEGATIVE NEGATIVE Final   Influenza B by PCR NEGATIVE NEGATIVE Final    Comment: (NOTE) The Xpert Xpress SARS-CoV-2/FLU/RSV plus assay is intended as an aid in the diagnosis of influenza from Nasopharyngeal swab specimens and should not be used as a sole basis for treatment. Nasal washings and aspirates are unacceptable for Xpert Xpress SARS-CoV-2/FLU/RSV testing.  Fact Sheet for Patients: EntrepreneurPulse.com.au  Fact Sheet for Healthcare Providers: IncredibleEmployment.be  This test is not yet approved or cleared by the [place] FDA and has been authorized for detection and/or diagnosis of SARS-CoV-2 by FDA under an Emergency Use Authorization (EUA). This EUA will remain in effect (meaning this test can be used) for the duration of the COVID-19 declaration under Section 564(b)(1) of the Act, 21 U.S.C. section 360bbb-3(b)(1), unless the authorization is terminated or revoked.  Performed at [facility], [place] [address] [place], [place] [address]   Blood Culture  (routine x 2)     Status: None   Collection Time: 09/05/22  7:53 PM   Specimen: BLOOD RIGHT FOREARM  Result Value Ref Range Status   Specimen Description   Final    BLOOD RIGHT FOREARM Performed at [facility], [place], [place] [address] [place], [place] [address]    Special Requests   Final    BOTTLES DRAWN AEROBIC AND ANAEROBIC Blood Culture adequate volume Performed at [place] [address], [place], [place] [address]    Culture   Final    NO GROWTH 5 DAYS Performed at [facility], [place] [address] [place], [place] [address]    Report Status 09/10/2022 FINAL  Final  Urine Culture     Status: Abnormal   Collection Time: 09/05/22  8:55 PM   Specimen: In/Out Cath Urine  Result Value Ref Range Status   Specimen Description   Final    IN/OUT CATH URINE Performed at [place] [address], [place],  [address]    Special Requests   Final    NONE Performed at [name], [place] [address], [place], [place] [address]    Culture 20,000 COLONIES/mL PROTEUS MIRABILIS (A)  Final   Report Status 09/07/2022 FINAL  Final   Organism ID, Bacteria PROTEUS MIRABILIS (A)  Final      Susceptibility   Proteus mirabilis - MIC*    AMPICILLIN <=2 SENSITIVE Sensitive     CEFAZOLIN 8 SENSITIVE Sensitive     CEFEPIME <=0.12 SENSITIVE Sensitive     CEFTRIAXONE <=0.25 SENSITIVE Sensitive     CIPROFLOXACIN >=4 RESISTANT Resistant     GENTAMICIN >=16 RESISTANT Resistant     IMIPENEM 2 SENSITIVE Sensitive     NITROFURANTOIN RESISTANT Resistant     TRIMETH/SULFA >=320 RESISTANT Resistant     AMPICILLIN/SULBACTAM <=2 SENSITIVE Sensitive     PIP/TAZO <=4 SENSITIVE Sensitive     * 20,000 COLONIES/mL PROTEUS  MIRABILIS  Urine Culture     Status: Abnormal   Collection Time: 09/16/22  3:35 AM   Specimen: Urine, Clean Catch  Result Value Ref Range Status   Specimen Description URINE, CLEAN CATCH  Final   Special Requests NONE   Final   Culture (A)  Final    >=100,000 COLONIES/mL DIPHTHEROIDS(CORYNEBACTERIUM SPECIES) Standardized susceptibility testing for this organism is not available. Performed at [facility], [place] [address], [place], [place] [address]    Report Status 09/17/2022 FINAL  Final  Culture, blood (Routine X 2) w Reflex to ID Panel     Status: None (Preliminary result)   Collection Time: 09/16/22  7:16 AM   Specimen: BLOOD  Result Value Ref Range Status   Specimen Description BLOOD BLOOD RIGHT HAND  Final   Special Requests   Final    BOTTLES DRAWN AEROBIC AND ANAEROBIC Blood Culture results may not be optimal due to an inadequate volume of blood received in culture bottles   Culture   Final    NO GROWTH < 24 HOURS Performed at [facility], [place] [address], [place], [place] [address]    Report Status PENDING  Incomplete    Studies/Results: US RENAL  Result Date: 09/16/2022 CLINICAL DATA:  Acute kidney injury.  Gross hematuria. EXAM: RENAL / URINARY TRACT ULTRASOUND COMPLETE COMPARISON:  CT scan 09/05/2022. FINDINGS: Right Kidney: Renal measurements: 9.7 x 6.1 x 5.8 cm = volume: 182 mL. Cortical thinning with increased echogenicity of parenchyma. No hydronephrosis. Left Kidney: Renal measurements: 10.7 x 6.1 x 8.3 cm = volume: 285 mL. Cortical thinning with increased echogenicity. 2.3 cm posterior interpolar simple cyst identified as noted on previous CT. Bladder: Right-sided bladder wall thickening, similar to prior CT. Other: None. IMPRESSION: 1. Bilateral renal cortical thinning with increased echogenicity of renal parenchyma compatible with chronic medical renal disease. 2. No hydronephrosis. 3. Right-sided bladder wall thickening, similar to prior CT. Electronically Signed   By: Misty Stanley M.D.   On: 09/16/2022 06:02   DG Chest Port 1 View  Result Date: 09/16/2022 CLINICAL DATA:  Weakness EXAM: PORTABLE CHEST 1 VIEW COMPARISON:  09/05/2022 FINDINGS: Right Port-A-Cath remains in  place, unchanged. Heart and mediastinal contours are within normal limits. No focal opacities or effusions. No acute bony abnormality. IMPRESSION: No active cardiopulmonary disease. Electronically Signed   By: Rolm Baptise M.D.   On: 09/16/2022 00:53      Assessment/Plan:  INTERVAL HISTORY: urine culture with >100 diptheroids = not pathogenic   Principal Problem:   AKI (acute kidney injury) ([place]) Active Problems:   Hypothyroidism   CAD (coronary artery disease)   Chronic obstructive pulmonary disease (HCC)   Diffuse large B-cell lymphoma (HCC)   Chronic systolic CHF (congestive heart failure) (HCC)   OSA (obstructive sleep apnea)   Paroxysmal atrial fibrillation ([place])   Recurrent UTI   Gross hematuria   Acute kidney injury (HCC)    [name] is a 76 y.o. male with   history of coronary disease atrial fibrillation diffuse B-cell lymphoma renal calculi including staghorn calculus who I have seen in the clinic for problems with colonization and at times infection and in the urinary tract who now has been on Epkinly infusions. He has a long standing history of falls which are likely ALTE factorial.  Prior to his infusion with oncology history he was apparently already see an shaking and was given a fluid bolus prior to being discharged to home.  He fell  3 times and ultimately came to the ER was found to be with a low blood pressure and acute kidney injury.  He was started on cefepime for presumed urinary tract infection given IV fluids.   #1  Initially presumed urinary tract infection: His cultures are growing nonpathogenic diphtheroids.  I will have discontinued his cefepime  #2 hypotension: Likely multifactorial with possibility of cytokine storm with Epkinly infusion  having played a role  I spent 52 minutes with the patient including than 50% of the time in face to face non-face-to-face time with the patient including  review of medical records in preparation for the  visit and during the visit and in coordination of his care.  I will for now please call with further questions.    LOS: 1 day   Alcide Evener 09/17/2022, 1:29 PM

## 2022-09-17 NOTE — Progress Notes (Signed)
Pt refused CPAP @ this time.

## 2022-09-17 NOTE — TOC Initial Note (Signed)
Transition of Care [facility]) - Initial/Assessment Note    Patient Details  Name: [name] MRN: [id number] Date of Birth: 1946/02/06  Transition of Care [facility]) CM/SW Contact:    Curlene Labrum, RN Phone Number: 09/17/2022, 11:48 AM  Clinical Narrative:                 CM met with the patient at the bedside to discuss transitions of care needs.  The patient currently lives with his spouse at the home and is active with [facility].  I placed renewed orders and asked attending MD to [facility] for PT/OT/ RN.  Tommi Rumps, RNCM with [facility] is aware.  The patient was admitted for fever, UTI and bloody urine.  The patient with history of IV antibiotics in the home through his Right chest port-a-cath but these were completed.  Not currently receiving IV medications at home.    Patient plans to return home when medically stable with his spouse by car.  No other needs at this time.  Expected Discharge Plan: [place] Barriers to Discharge: Continued Medical Work up   Patient Goals and CMS Choice Patient states their goals for this hospitalization and ongoing recovery are:: To get better and return home CMS Medicare.gov Compare Post Acute Care list provided to:: Patient Choice offered to / list presented to : Patient  Expected Discharge Plan and Services Expected Discharge Plan: [place]   Discharge Planning Services: CM Consult Post Acute Care Choice: [place] arrangements for the past 2 months: [place] Arranged: RN, PT, OT [facility] Agency: [place] Date [place]: 09/17/22 Time [place]: [id number] Representative spoke with at [place]: Tommi Rumps, RNCM with [facility] orders renewed  Prior Living Arrangements/Services Living arrangements for the past 2 months: Single Family Home Lives with:: Spouse Patient language and need for interpreter reviewed:: Yes Do you feel  safe going back to the place where you live?: Yes      Need for Family Participation in Patient Care: Yes (Comment) Care giver support system in place?: Yes (comment) Current home services: Home OT, Home PT, Home RN (DME at home includes Rolator, Chair lift, RW, [place], [facility], shower seat, and safety grab bars in home, Active with [facility]) Criminal Activity/Legal Involvement Pertinent to Current Situation/Hospitalization: No - Comment as needed  Activities of Daily Living Home Assistive Devices/Equipment: Eyeglasses, Hearing aid, Shower chair with back, Walker (specify type) ADL Screening (condition at time of admission) Patient's cognitive ability adequate to safely complete daily activities?: Yes Is the patient deaf or have difficulty hearing?: Yes Does the patient have difficulty seeing, even when wearing glasses/contacts?: No Does the patient have difficulty concentrating, remembering, or making decisions?: No Patient able to express need for assistance with ADLs?: Yes Does the patient have difficulty dressing or bathing?: No Independently performs ADLs?: Yes (appropriate for developmental age) Does the patient have difficulty walking or climbing stairs?: No Weakness of Legs: Both Weakness of Arms/Hands: None  Permission Sought/Granted Permission sought to share information with : Case Manager Permission granted to share information with : Yes, Verbal Permission Granted     Permission granted to share info w AGENCY: [place] granted to share info w Relationship: spouse     Emotional Assessment Appearance::  Appears stated age Attitude/Demeanor/Rapport: Gracious Affect (typically observed): Accepting Orientation: : Oriented to Self, Oriented to Place, Oriented to  Time, Oriented to Situation Alcohol / Substance Use: Not Applicable Psych Involvement: No (comment)  Admission diagnosis:  Chronic anticoagulation [Z79.01] Acute cystitis with hematuria [N30.01] AKI (acute  kidney injury) ([place]) [N17.9] Acute kidney injury ([place]) [N17.9] Patient Active Problem List   Diagnosis Date Noted   Gross hematuria 09/16/2022   Acute kidney injury ([place]) 09/16/2022   Syncope 09/05/2022   Orthostasis 07/23/2022   Malnutrition of moderate degree 07/22/2022   Recurrent UTI 06/02/2022   Diffuse large B cell lymphoma ([place]) 05/24/2022   Recurrent falls 05/20/2022   Orthostatic hypotension 04/27/2022   Sepsis secondary to UTI ([place]) 04/22/2022   Encounter for antineoplastic chemotherapy    AKI (acute kidney injury) ([place]) 03/19/2022   DNR (do not resuscitate)/DNI(Do Not Intubate) 03/19/2022   Lung mass 03/06/2022   Stage 3a chronic kidney disease (CKD) ([place]) - Baseline Scr 1.5 - 1.6 03/06/2022   Allergic rhinitis 01/25/2022   Allergic rhinitis due to animal (cat) (dog) hair and dander 01/25/2022   Allergic rhinitis due to pollen 01/25/2022   Mild intermittent asthma 01/25/2022   Vasomotor rhinitis 01/25/2022   Multiple skin nodules 01/25/2022   Weight loss 01/25/2022   Pure hypercholesterolemia 53/61/4431   Chronic systolic CHF (congestive heart failure) ([place]) 06/21/2019   H/O nephrolithotomy with removal of calculi 03/26/2019   Renal calculus 03/26/2019   History of chemotherapy 11/21/2018   Lymphoma in remission ([place]) 11/21/2018   Generalized weakness 11/08/2018   Well adult exam 10/02/2018   Counseling regarding advance care planning and goals of care 07/09/2018   Diffuse large B-cell lymphoma ([place]) 06/23/2018   Port-A-Cath in place 06/23/2018   Hypogonadism in male 11/22/2017   Nephrolithiasis 10/24/2017   Recurrent dislocation of left patella 07/15/2017   Recurrent subluxation of patella, left 07/08/2017   At high risk for falls 05/17/2017   S/P revision of total knee 04/11/2017   Failed total knee, left, initial encounter ([place]) 04/09/2017   Renal calculus, right 02/08/2017   Protein-calorie malnutrition, severe ([place]) 12/08/2016   Chronic obstructive  pulmonary disease ([place]) 12/08/2016   [place] renal calculus 11/05/2016   Senile osteoporosis 06/08/2016   Dyslipidemia 01/12/2016   Insomnia secondary to depression with anxiety 12/02/2015   Generalized anxiety disorder 11/19/2015   Wound drainage right hip 11/15/2013   Expected blood loss anemia 09/18/2013   S/P right TH revision 09/17/2013   BPH with obstruction/lower urinary tract symptoms 08/03/2013   History of gastroesophageal reflux (GERD) 08/03/2013   OSA (obstructive sleep apnea) 08/03/2013   Paroxysmal atrial fibrillation ([place]) 08/03/2013   Presence of unspecified artificial hip joint 08/03/2013   Nonunion, fracture, Right tibia  07/03/2013   UTI (urinary tract infection) 07/03/2013   Chronic anticoagulation 02/21/2013   Fall 02/08/2013   Physical deconditioning 02/08/2013   Low back pain radiating to both legs 02/08/2013   Compression fracture of L1 lumbar vertebra ([place]) 02/08/2013   Morbid obesity ([place]) 02/05/2013   Anemia 10/06/2012   Essential hypertension 10/03/2012   Psoriasis 10/03/2012   Vitamin D deficiency 10/03/2012   GERD (gastroesophageal reflux disease) 10/03/2012   Anxiety disorder 10/03/2012   Depression 10/03/2012   CAD (coronary artery disease) 10/03/2012   DJD (degenerative joint disease) 10/03/2012   Chronic gingivitis 10/03/2012   Low testosterone, possible hypogonadism 10/01/2012   Normocytic anemia 09/30/2012   Longstanding persistent atrial fibrillation ([place]) 09/29/2012   Hypothyroidism 09/29/2012   History of  recurrent UTIs 09/29/2012   PCP:  Plotnikov, Evie Lacks, MD Pharmacy:   [id number], [address] [place] [address] Phone: [phone] Fax: [place], [place] [address] Phone: [phone] Fax: [phone]  [place] [address], [place] [address] Phone: [phone] Fax: [phone]     Social Determinants of Health (SDOH) Interventions Housing Interventions: Intervention Not Indicated  Readmission Risk Interventions    09/17/2022   11:42 AM 04/27/2022    9:56 AM 04/26/2022    6:28 PM  Readmission Risk Prevention Plan  Transportation Screening Complete  Complete  Medication Review (RN Care Manager) Complete  Referral to Pharmacy  PCP or Specialist appointment within 3-5 days of discharge Complete Complete   HRI or [place] Complete  Complete  SW Recovery Care/Counseling Consult Complete Complete   Palliative Care Screening Not Applicable  Not [place] Not Applicable  Not Applicable

## 2022-09-17 NOTE — Consult Note (Addendum)
[place] Nurse Consult Note: Reason for Consult:left hip ulcer. Performed remotely after review of progress notes and recent [place] consult on 11/27. Per patient report this is a cancerous lesion being treated with radiation currently at [facility]. Wound type:full thickness; malignancy   Topical treatment orders provided for bedside nurses to perform as was the previous plan of care prior to admission:  Cleanse periwound and wound with saline, pat dry Apply zinc barrier to periwound Pack wound bed with silver hydrofiber (Aquacel AG+) and top with silicone foam.  Change daily  Follow up with [facility], they are following this patient for this wound   Please re-consult if further assistance is needed.  Thank-you,  Julien Girt MSN, [place], [place], [place], [place]

## 2022-09-17 NOTE — Progress Notes (Signed)
Initial Nutrition Assessment  DOCUMENTATION CODES:   Non-severe (moderate) malnutrition in context of chronic illness  INTERVENTION:  Continue current diet as ordered, encourage PO intake Continue Ensure Enlive po BID, each supplement provides 350 kcal and 20 grams of protein. 1 packet Juven BID, each packet provides 95 calories, 2.5 grams of protein (collagen), and 9.8 grams of carbohydrate (3 grams sugar); also contains 7 grams of L-arginine and L-glutamine, 300 mg vitamin C, 15 mg vitamin E, 1.2 mcg vitamin B-12, 9.5 mg zinc, 200 mg calcium, and 1.5 g  Calcium Beta-hydroxy-Beta-methylbutyrate to support wound healing  NUTRITION DIAGNOSIS:  Moderate Malnutrition (in the context of chronic illness) related to cancer and cancer related treatments as evidenced by mild muscle depletion, moderate fat depletion.  GOAL:  Patient will meet greater than or equal to 90% of their needs  MONITOR:  PO intake, Supplement acceptance, Labs, Weight trends  REASON FOR ASSESSMENT:  Malnutrition Screening Tool    ASSESSMENT:  Pt with hx COPD, HTN, HLD, CAD, CHF, and B-cell lymphoma presented to ED with weakness/falls and hematuria after recent admission.   Pt and spouse in room at the time of assessment. Pt reports good intake since admission, most of lunch tray consumed at bedside. Pt reports he has had 2 ensures so far today and currently drinking a milkshake from cookout. Endorses some weight loss over the last few months. Encouraged good PO intake to discourage further loss and to aid in wound healing.  Agreeable to also adding juven while admitted to help with wound care.   Noted 5% weight loss x 3 months (06/12/22-09/17/22)  Nutritionally Relevant Medications: Scheduled Meds:  ascorbic acid  500 mg Oral Q lunch   atorvastatin  80 mg Oral Daily   ezetimibe  10 mg Oral QHS   feeding supplement  237 mL Oral BID BM   pantoprazole  40 mg Oral Daily   Continuous Infusions:  ceFEPime (MAXIPIME)  IV 2 g (09/17/22 0602)   PRN Meds: ondansetron  Labs Reviewed: Na 134 BUN 32, creatinine 1.62 CBG ranges from 82-107 mg/dL over the last 24 hours  NUTRITION - FOCUSED PHYSICAL EXAM: Flowsheet Row Most Recent Value  Orbital Region Moderate depletion  Upper Arm Region Severe depletion  Thoracic and Lumbar Region Moderate depletion  Buccal Region Mild depletion  Temple Region Mild depletion  Clavicle Bone Region Mild depletion  Clavicle and Acromion Bone Region Moderate depletion  Scapular Bone Region Mild depletion  Dorsal Hand Mild depletion  Patellar Region Mild depletion  Anterior Thigh Region Mild depletion  Posterior Calf Region Mild depletion  Edema (RD Assessment) Mild  Hair Reviewed  Eyes Reviewed  Mouth Reviewed  Skin Reviewed  Nails Reviewed    Diet Order:   Diet Order             Diet regular Room service appropriate? Yes; Fluid consistency: Thin  Diet effective now                   EDUCATION NEEDS:  Education needs have been addressed  Skin:  Skin Assessment: Reviewed RN Assessment  Last BM:  12/8  Height:  Ht Readings from Last 1 Encounters:  09/15/22 '6\' 4"'$  (1.93 m)    Weight:  Wt Readings from Last 1 Encounters:  09/17/22 108.6 kg    Ideal Body Weight:  91.8 kg  BMI:  Body mass index is 29.14 kg/m.  Estimated Nutritional Needs:  Kcal:  2300-2500 kcal/d Protein:  120-140g/d Fluid:  >/=2.3L/d  Ranell Patrick, RD, LDN Clinical Dietitian RD pager # available in [place]  After hours/weekend pager # available in [facility]

## 2022-09-17 NOTE — Progress Notes (Signed)
[facility] PROGRESS NOTE    Progress Note  [name]  OBS:[id number] DOB: 03/09/1946 DOA: 09/15/2022 PCP: Cassandria Anger, MD     Brief Narrative:   [name] is an 76 y.o. male past medical history of psoriasis, COPD, essential hypertension, chronic systolic heart failure atrial fibrillation on Xarelto recurrent UTI diffuse large B-cell lymphoma comes into the ED for generalized weakness dysuria, gross hematuria and a fall, he was recently treated for Proteus UTI and his urine symptoms resolved but shortly after completing the course became traumatic.  In the ED was found to be febrile UA showed many white blood cells red cells bacteria acute kidney injury (with a creatinine of 1.8 with a baseline like around 1.1-1.3)   Assessment/Plan:   AKI (acute kidney injury) ([place]) Baseline creatinine around 1.1-1.3 on admission 1.8 with gross hematuria. Continue IV fluids for an additional 12 hours basic metabolic panels pending. Renal ultrasound bilateral renal cortical thickening hydronephrosis. Out of bed to chair, consult physical therapy.  Hematuria recurrent UTIs in the setting of Xarelto: Continues to have gross hematuria, hematuria has not cleared.  He does relate that his dysuria has improved. Hold Xarelto today.   Continue IV cefepime. Urine cultures and blood cultures are pending. Hemoglobin is to stable.  Normocytic anemia/acute blood loss anemia: Hold Xarelto as his hematuria has not subsided CBC this morning is pending  Chronic systolic heart failure with a last EF of 35%: Appears to be compensated, continue beta-blockers will be judicious with IV fluids. He is not on any diuretics at home. Continue to monitor strict I's and O's.  Diffuse large B cell lymphoma: Recurrent on April 2023 currently on epcoritamab with last dose 09/15/22  .  Chronic atrial fibrillation: Continue metoprolol and Xarelto.  COPD/obstructive sleep apnea: Compensated  CPAP at night.  Hypothyroidism: Continue Synthroid.  Anxiety/depression: Continue Wellbutrin Cymbalta and Klonopin.   DVT prophylaxis: SCDs Family Communication:none Status is: Observation The patient will require care spanning > 2 midnights and should be moved to inpatient because: Acute kidney injury and recurrent UTI with hematuria    Code Status:     Code Status Orders  (From admission, onward)           Start     Ordered   09/16/22 0456  Do not attempt resuscitation (DNR)  Continuous       Question Answer Comment  In the event of cardiac or respiratory ARREST Do not call a "code blue"   In the event of cardiac or respiratory ARREST Do not perform Intubation, CPR, defibrillation or ACLS   In the event of cardiac or respiratory ARREST Use medication by any route, position, wound care, and other measures to relive pain and suffering. May use oxygen, suction and manual treatment of airway obstruction as needed for comfort.   Comments verified with pt and pt's husband michael      09/16/22 0457           Code Status History     Date Active Date Inactive Code Status Order ID Comments User Context   09/06/2022 0015 09/07/2022 2238 DNR [id number]  Toy Baker, MD Inpatient   07/21/2022 0913 07/23/2022 2338 DNR [id number]  Karmen Bongo, MD ED   04/23/2022 0403 04/27/2022 2024 DNR [id number]  Vianne Bulls, MD ED   03/19/2022 2059 03/26/2022 1733 DNR [id number]  Kristopher Oppenheim, DO Inpatient   03/06/2022 2124 03/08/2022 1841 Full Code [id number]  Etta Quill, DO Inpatient   08/13/2019  1130 08/13/2019 1833 Full Code [id number]  Nelva Bush, MD Inpatient   04/17/2019 1627 04/18/2019 1809 Full Code [id number]  Cleon Gustin, MD Inpatient   03/26/2019 1821 03/29/2019 1409 Full Code [id number]  Cleon Gustin, MD Inpatient   11/09/2018 0316 11/11/2018 1635 Full Code [id number]  Rise Patience, MD ED   10/24/2017 1459 10/25/2017 1630 Full Code [id number]  Cleon Gustin, MD Inpatient   07/15/2017 1221 07/16/2017 1707 Full Code [id number]  Leighton Parody, PA-C Inpatient   04/11/2017 1716 04/13/2017 1724 Full Code [id number]  Neldon Newport Inpatient   02/08/2017 1123 02/09/2017 1722 Full Code [id number]  Cleon Gustin, MD Inpatient   11/03/2016 0027 11/05/2016 2205 Full Code [id number]  Ivor Costa, MD ED   11/15/2013 1700 11/19/2013 1824 Full Code [id number]  Pricilla Loveless, PA-C Inpatient   10/26/2013 1709 10/30/2013 1739 Full Code [id number]  Pricilla Loveless, PA-C Inpatient   09/17/2013 1856 09/20/2013 1622 Full Code [id number]  Pricilla Loveless, PA-C Inpatient   02/21/2013 0322 02/24/2013 1707 Full Code [id number]  [name], MD Inpatient   02/03/2013 0049 02/09/2013 1825 Full Code [id number]  [name] Schooling, MD ED   09/29/2012 0311 10/10/2012 1814 Full Code [id number]  Abbott Pao, RN Inpatient         IV Access:   Peripheral IV   Procedures and diagnostic studies:   US RENAL  Result Date: 09/16/2022 CLINICAL DATA:  Acute kidney injury.  Gross hematuria. EXAM: RENAL / URINARY TRACT ULTRASOUND COMPLETE COMPARISON:  CT scan 09/05/2022. FINDINGS: Right Kidney: Renal measurements: 9.7 x 6.1 x 5.8 cm = volume: 182 mL. Cortical thinning with increased echogenicity of parenchyma. No hydronephrosis. Left Kidney: Renal measurements: 10.7 x 6.1 x 8.3 cm = volume: 285 mL. Cortical thinning with increased echogenicity. 2.3 cm posterior interpolar simple cyst identified as noted on previous CT. Bladder: Right-sided bladder wall thickening, similar to prior CT. Other: None. IMPRESSION: 1. Bilateral renal cortical thinning with increased echogenicity of renal parenchyma compatible with chronic medical renal disease. 2. No hydronephrosis. 3. Right-sided bladder wall thickening, similar to prior CT. Electronically Signed   By: Misty Stanley M.D.   On: 09/16/2022 06:02   DG Chest Port 1 View  Result Date: 09/16/2022 CLINICAL DATA:  Weakness EXAM: PORTABLE  CHEST 1 VIEW COMPARISON:  09/05/2022 FINDINGS: Right Port-A-Cath remains in place, unchanged. Heart and mediastinal contours are within normal limits. No focal opacities or effusions. No acute bony abnormality. IMPRESSION: No active cardiopulmonary disease. Electronically Signed   By: Rolm Baptise M.D.   On: 09/16/2022 00:53     Medical Consultants:   None.   Subjective:    [name] dysuria has resolved.  Objective:    Vitals:   09/16/22 1800 09/16/22 1953 09/17/22 0005 09/17/22 0256  BP: 105/70 112/65 101/60 (!) 108/55  Pulse: 88 93 88 78  Resp: '18 16 16 16  '$ Temp:  98.6 F (37 C) 98.9 F (37.2 C) (!) 97.4 F (36.3 C)  TempSrc:      SpO2: 99% 100%  99%  Weight:    108.6 kg  Height:       SpO2: 99 %   Intake/Output Summary (Last 24 hours) at 09/17/2022 0758 Last data filed at 09/17/2022 0300 Gross per 24 hour  Intake --  Output 500 ml  Net -500 ml    Filed Weights   09/15/22 2219 09/17/22 0256  Weight: 103.9 kg 108.6 kg    Exam:  General exam: In no acute distress. Respiratory system: Good air movement and clear to auscultation. Cardiovascular system: S1 & S2 heard, RRR. No JVD. Gastrointestinal system: Abdomen is nondistended, soft and nontender.  Extremities: No pedal edema. Skin: No rashes, lesions or ulcers Psychiatry: Judgement and insight appear normal. Mood & affect appropriate.  Data Reviewed:    Labs: Basic Metabolic Panel: Recent Labs  Lab 09/16/22 0127 09/16/22 0557  NA 140 137  K 4.7 4.5  CL 106 104  CO2 23 26  GLUCOSE 107* 103*  BUN 38* 38*  CREATININE 1.96* 1.84*  CALCIUM 8.2* 7.9*  MG  --  1.9  PHOS  --  3.3    GFR Estimated Creatinine Clearance: 45.4 mL/min (A) (by C-G formula based on SCr of 1.84 mg/dL (H)). Liver Function Tests: Recent Labs  Lab 09/16/22 0127  AST 26  ALT 28  ALKPHOS 47  BILITOT 0.6  PROT 4.4*  ALBUMIN 2.2*    No results for input(s): "LIPASE", "AMYLASE" in the last 168 hours. No results  for input(s): "AMMONIA" in the last 168 hours. Coagulation profile No results for input(s): "INR", "PROTIME" in the last 168 hours. COVID-19 Labs  No results for input(s): "DDIMER", "FERRITIN", "LDH", "CRP" in the last 72 hours.  Lab Results  Component Value Date   SARSCOV2NAA NEGATIVE 09/05/2022   SARSCOV2NAA NEGATIVE 01/06/2022   SARSCOV2NAA POSITIVE (A) 10/13/2021   [place] NEGATIVE 05/19/2021    CBC: Recent Labs  Lab 09/16/22 0127 09/16/22 0557  WBC 7.5 6.2  NEUTROABS 5.4  --   HGB 12.4* 11.8*  HCT 37.4* 36.8*  MCV 94.2 95.6  PLT 145* 149*    Cardiac Enzymes: Recent Labs  Lab 09/16/22 0557  CKTOTAL 47*    BNP (last 3 results) No results for input(s): "PROBNP" in the last 8760 hours. CBG: No results for input(s): "GLUCAP" in the last 168 hours. D-Dimer: No results for input(s): "DDIMER" in the last 72 hours. Hgb A1c: No results for input(s): "HGBA1C" in the last 72 hours. Lipid Profile: No results for input(s): "CHOL", "HDL", "LDLCALC", "TRIG", "CHOLHDL", "LDLDIRECT" in the last 72 hours. Thyroid function studies: No results for input(s): "TSH", "T4TOTAL", "T3FREE", "THYROIDAB" in the last 72 hours.  Invalid input(s): "FREET3" Anemia work up: Recent Labs    09/16/22 [place] 895   Sepsis Labs: Recent Labs  Lab 09/16/22 0127 09/16/22 0557  WBC 7.5 6.2    Microbiology No results found for this or any previous visit (from the past 240 hour(s)).   Medications:    acyclovir  400 mg Oral BID   allopurinol  100 mg Oral BID   ascorbic acid  500 mg Oral Q lunch   atorvastatin  80 mg Oral Daily   buPROPion  300 mg Oral Daily   DULoxetine  60 mg Oral BID   ezetimibe  10 mg Oral QHS   feeding supplement  237 mL Oral BID BM   levothyroxine  125 mcg Oral Q0600   metoprolol tartrate  25 mg Oral BID   pantoprazole  40 mg Oral Daily   rivaroxaban  20 mg Oral Q supper   Continuous Infusions:  sodium chloride     ceFEPime (MAXIPIME) IV 2  g (09/17/22 0602)      LOS: 1 day   Charlynne Cousins  [facility]  09/17/2022, 7:58 AM

## 2022-09-18 DIAGNOSIS — N179 Acute kidney failure, unspecified: Secondary | ICD-10-CM | POA: Diagnosis not present

## 2022-09-18 LAB — CBC
HCT: 35.4 % — ABNORMAL LOW (ref 39.0–52.0)
Hemoglobin: 11.5 g/dL — ABNORMAL LOW (ref 13.0–17.0)
MCH: 30.5 pg (ref 26.0–34.0)
MCHC: 32.5 g/dL (ref 30.0–36.0)
MCV: 93.9 fL (ref 80.0–100.0)
Platelets: 126 10*3/uL — ABNORMAL LOW (ref 150–400)
RBC: 3.77 MIL/uL — ABNORMAL LOW (ref 4.22–5.81)
RDW: 20.6 % — ABNORMAL HIGH (ref 11.5–15.5)
WBC: 7 10*3/uL (ref 4.0–10.5)
nRBC: 0 % (ref 0.0–0.2)

## 2022-09-18 LAB — BASIC METABOLIC PANEL
Anion gap: 7 (ref 5–15)
BUN: 36 mg/dL — ABNORMAL HIGH (ref 8–23)
CO2: 22 mmol/L (ref 22–32)
Calcium: 7.8 mg/dL — ABNORMAL LOW (ref 8.9–10.3)
Chloride: 106 mmol/L (ref 98–111)
Creatinine, Ser: 1.47 mg/dL — ABNORMAL HIGH (ref 0.61–1.24)
GFR, Estimated: 49 mL/min — ABNORMAL LOW (ref >60)
Glucose, Bld: 114 mg/dL — ABNORMAL HIGH (ref 70–99)
Potassium: 4.1 mmol/L (ref 3.5–5.1)
Sodium: 135 mmol/L (ref 135–145)

## 2022-09-18 MED ORDER — SODIUM CHLORIDE 0.9 % IV SOLN: INTRAVENOUS | Status: AC

## 2022-09-18 NOTE — Progress Notes (Signed)
[facility] PROGRESS NOTE    Progress Note  [name]  AOZ:[id number] DOB: 1945-11-02 DOA: 09/15/2022 PCP: Cassandria Anger, MD     Brief Narrative:   [name] is an 76 y.o. male past medical history of psoriasis, COPD, essential hypertension, chronic systolic heart failure atrial fibrillation on Xarelto recurrent UTI diffuse large B-cell lymphoma comes into the ED for generalized weakness dysuria, gross hematuria and a fall, he was recently treated for Proteus UTI and his urine symptoms resolved but shortly after completing the course became traumatic.  In the ED was found to be febrile UA showed many white blood cells red cells bacteria acute kidney injury (with a creatinine of 1.8 with a baseline like around 1.1-1.3).     Assessment/Plan:   AKI (acute kidney injury) ([place]) Baseline creatinine around 1.1-1.3 on admission 1.8 with gross hematuria. Continue IV fluids for an additional 12 hours basic metabolic panels pending. Renal ultrasound bilateral renal cortical thickening hydronephrosis. Out of bed to chair -IVF for now as still very dark urine  Prior CT scan shows: Stable urinary bladder diverticulum with asymmetric, lateral urinary bladder wall thickening, as described above. While this may represent sequelae associated with cystitis, sequelae associated with an underlying neoplasm cannot be excluded. Urology consult and follow-up cystoscopy is recommended. -does not appear patient was referred to urology  Hematuria recurrent UTIs in the setting of Xarelto: Continues to have gross hematuria, hematuria has not cleared.  He does relate that his dysuria has improved. Hold Xarelto today.   Urine cultures and blood cultures are NGTD -ID saw and d/c'd abx -daily labs  Normocytic anemia/acute blood loss anemia: Hold Xarelto as his hematuria has not subsided CBC daily  Chronic systolic heart failure with a last EF of 35%: Appears to be compensated,  continue beta-blockers will be judicious with IV fluids. He is not on any diuretics at home. Continue to monitor strict I's and O's.  Diffuse large B cell lymphoma: Recurrent on April 2023 currently on epcoritamab with last dose 09/15/22  .  Chronic atrial fibrillation: Continue metoprolol and Xarelto.  COPD/obstructive sleep apnea: Compensated CPAP at night.  Hypothyroidism: Continue Synthroid.  Anxiety/depression: Continue Wellbutrin Cymbalta and Klonopin.   DVT prophylaxis: SCDs Family Communication:none Status is: in[pt    Code Status:     Code Status Orders  (From admission, onward)           Start     Ordered   09/16/22 0456  Do not attempt resuscitation (DNR)  Continuous       Question Answer Comment  In the event of cardiac or respiratory ARREST Do not call a "code blue"   In the event of cardiac or respiratory ARREST Do not perform Intubation, CPR, defibrillation or ACLS   In the event of cardiac or respiratory ARREST Use medication by any route, position, wound care, and other measures to relive pain and suffering. May use oxygen, suction and manual treatment of airway obstruction as needed for comfort.   Comments verified with pt and pt's husband michael      09/16/22 0457           Subjective:    Still not feeling well-- very tired  Objective:    Vitals:   09/17/22 1659 09/17/22 1936 09/18/22 0500 09/18/22 0828  BP: (!) 105/59 115/60  134/70  Pulse: 82 93  96  Resp: '18 17  18  '$ Temp: 98.5 F (36.9 C) 98 F (36.7 C)  98.4 F (  36.9 C)  TempSrc: Oral Oral  Oral  SpO2: 100% 98%  100%  Weight:   108 kg   Height:       SpO2: 100 %   Intake/Output Summary (Last 24 hours) at 09/18/2022 1139 Last data filed at 09/18/2022 0848 Gross per 24 hour  Intake 1508.38 ml  Output 2450 ml  Net -941.62 ml   Filed Weights   09/15/22 2219 09/17/22 0256 09/18/22 0500  Weight: 103.9 kg 108.6 kg 108 kg    Exam:  General: Appearance:      Overweight male who appears fatigues     Lungs:     Clear to auscultation bilaterally, respirations unlabored  Heart:    Normal heart rate.    MS:   All extremities are intact.   Neurologic:   Awake, alert, oriented x 3. No apparent focal neurological           defect.      Data Reviewed:    Labs: Basic Metabolic Panel: Recent Labs  Lab 09/16/22 0127 09/16/22 0557 09/17/22 0749 09/18/22 0336  NA 140 137 134* 135  K 4.7 4.5 4.1 4.1  CL 106 104 102 106  CO2 '23 26 23 22  '$ GLUCOSE 107* 103* 82 114*  BUN 38* 38* 32* 36*  CREATININE 1.96* 1.84* 1.62* 1.47*  CALCIUM 8.2* 7.9* 7.9* 7.8*  MG  --  1.9  --   --   PHOS  --  3.3  --   --    GFR Estimated Creatinine Clearance: 56.7 mL/min (A) (by C-G formula based on SCr of 1.47 mg/dL (H)). Liver Function Tests: Recent Labs  Lab 09/16/22 0127  AST 26  ALT 28  ALKPHOS 47  BILITOT 0.6  PROT 4.4*  ALBUMIN 2.2*   No results for input(s): "LIPASE", "AMYLASE" in the last 168 hours. No results for input(s): "AMMONIA" in the last 168 hours. Coagulation profile No results for input(s): "INR", "PROTIME" in the last 168 hours. COVID-19 Labs  No results for input(s): "DDIMER", "FERRITIN", "LDH", "CRP" in the last 72 hours.  Lab Results  Component Value Date   SARSCOV2NAA NEGATIVE 09/05/2022   SARSCOV2NAA NEGATIVE 01/06/2022   SARSCOV2NAA POSITIVE (A) 10/13/2021   [place] NEGATIVE 05/19/2021    CBC: Recent Labs  Lab 09/16/22 0127 09/16/22 0557 09/17/22 0749 09/18/22 0336  WBC 7.5 6.2 6.6 7.0  NEUTROABS 5.4  --   --   --   HGB 12.4* 11.8* 12.2* 11.5*  HCT 37.4* 36.8* 36.1* 35.4*  MCV 94.2 95.6 92.6 93.9  PLT 145* 149* 134* 126*   Cardiac Enzymes: Recent Labs  Lab 09/16/22 0557  CKTOTAL 47*   BNP (last 3 results) No results for input(s): "PROBNP" in the last 8760 hours. CBG: No results for input(s): "GLUCAP" in the last 168 hours. D-Dimer: No results for input(s): "DDIMER" in the last 72 hours. Hgb  A1c: No results for input(s): "HGBA1C" in the last 72 hours. Lipid Profile: No results for input(s): "CHOL", "HDL", "LDLCALC", "TRIG", "CHOLHDL", "LDLDIRECT" in the last 72 hours. Thyroid function studies: No results for input(s): "TSH", "T4TOTAL", "T3FREE", "THYROIDAB" in the last 72 hours.  Invalid input(s): "FREET3" Anemia work up: Recent Labs    09/16/22 [place] 895   Sepsis Labs: Recent Labs  Lab 09/16/22 0127 09/16/22 0557 09/17/22 0749 09/18/22 0336  WBC 7.5 6.2 6.6 7.0   Microbiology Recent Results (from the past 240 hour(s))  Urine Culture     Status: Abnormal   Collection Time:  09/16/22  3:35 AM   Specimen: Urine, Clean Catch  Result Value Ref Range Status   Specimen Description URINE, CLEAN CATCH  Final   Special Requests NONE  Final   Culture (A)  Final    >=100,000 COLONIES/mL DIPHTHEROIDS(CORYNEBACTERIUM SPECIES) Standardized susceptibility testing for this organism is not available. Performed at [facility], [place] [address] [place], [place] [address]    Report Status 09/17/2022 FINAL  Final  Culture, blood (Routine X 2) w Reflex to ID Panel     Status: None (Preliminary result)   Collection Time: 09/16/22  7:16 AM   Specimen: BLOOD  Result Value Ref Range Status   Specimen Description BLOOD BLOOD RIGHT HAND  Final   Special Requests   Final    BOTTLES DRAWN AEROBIC AND ANAEROBIC Blood Culture results may not be optimal due to an inadequate volume of blood received in culture bottles   Culture   Final    NO GROWTH 2 DAYS Performed at [facility], [place] [address], [place], [place] [address]    Report Status PENDING  Incomplete     Medications:    acyclovir  400 mg Oral BID   allopurinol  100 mg Oral BID   ascorbic acid  500 mg Oral Q lunch   atorvastatin  80 mg Oral Daily   buPROPion  300 mg Oral Daily   DULoxetine  60 mg Oral BID   ezetimibe  10 mg Oral QHS   feeding supplement  237 mL Oral BID BM   levothyroxine  125  mcg Oral Q0600   liver oil-zinc oxide   Topical Daily   metoprolol tartrate  25 mg Oral BID   nutrition supplement (JUVEN)  1 packet Oral BID BM   pantoprazole  40 mg Oral Daily   Continuous Infusions:  sodium chloride 50 mL/hr at 09/18/22 1058      LOS: 2 days   Geradine Girt  [facility]  09/18/2022, 11:39 AM

## 2022-09-18 NOTE — Progress Notes (Signed)
Patient BP 97/51. Dr. Eliseo Squires made aware

## 2022-09-18 NOTE — Progress Notes (Signed)
Pt sore on left hip dressing changed per instructions in order.

## 2022-09-18 NOTE — Progress Notes (Signed)
Pt refused to wear CPAP @ this time.

## 2022-09-19 DIAGNOSIS — N179 Acute kidney failure, unspecified: Secondary | ICD-10-CM | POA: Diagnosis not present

## 2022-09-19 LAB — URINALYSIS, ROUTINE W REFLEX MICROSCOPIC
Bacteria, UA: NONE SEEN
Bilirubin Urine: NEGATIVE
Glucose, UA: NEGATIVE mg/dL
Ketones, ur: NEGATIVE mg/dL
Nitrite: NEGATIVE
Protein, ur: >=300 mg/dL — AB
RBC / HPF: >50 RBC/hpf — ABNORMAL HIGH (ref 0–5)
Specific Gravity, Urine: 1.014 (ref 1.005–1.030)
pH: 8 (ref 5.0–8.0)

## 2022-09-19 LAB — CBC
HCT: 37.7 % — ABNORMAL LOW (ref 39.0–52.0)
Hemoglobin: 12.2 g/dL — ABNORMAL LOW (ref 13.0–17.0)
MCH: 30.7 pg (ref 26.0–34.0)
MCHC: 32.4 g/dL (ref 30.0–36.0)
MCV: 95 fL (ref 80.0–100.0)
Platelets: 122 10*3/uL — ABNORMAL LOW (ref 150–400)
RBC: 3.97 MIL/uL — ABNORMAL LOW (ref 4.22–5.81)
RDW: 20.7 % — ABNORMAL HIGH (ref 11.5–15.5)
WBC: 7 10*3/uL (ref 4.0–10.5)
nRBC: 0 % (ref 0.0–0.2)

## 2022-09-19 LAB — BASIC METABOLIC PANEL
Anion gap: 11 (ref 5–15)
BUN: 34 mg/dL — ABNORMAL HIGH (ref 8–23)
CO2: 21 mmol/L — ABNORMAL LOW (ref 22–32)
Calcium: 7.7 mg/dL — ABNORMAL LOW (ref 8.9–10.3)
Chloride: 105 mmol/L (ref 98–111)
Creatinine, Ser: 1.24 mg/dL (ref 0.61–1.24)
GFR, Estimated: 60 mL/min — ABNORMAL LOW (ref >60)
Glucose, Bld: 89 mg/dL (ref 70–99)
Potassium: 4.2 mmol/L (ref 3.5–5.1)
Sodium: 137 mmol/L (ref 135–145)

## 2022-09-19 NOTE — Progress Notes (Signed)
Patient declines CPAP. No unit in room at this time. 

## 2022-09-19 NOTE — Progress Notes (Signed)
[facility] PROGRESS NOTE    Progress Note  [name]  TKW:[id number] DOB: 1946/09/27 DOA: 09/15/2022 PCP: Cassandria Anger, MD     Brief Narrative:   [name] is an 76 y.o. male past medical history of psoriasis, COPD, essential hypertension, chronic systolic heart failure atrial fibrillation on Xarelto recurrent UTI diffuse large B-cell lymphoma comes into the ED for generalized weakness dysuria, gross hematuria and a fall, he was recently treated for Proteus UTI and his urine symptoms resolved but shortly after completing the course became traumatic.  In the ED was found to be febrile UA showed many white blood cells red cells bacteria acute kidney injury (with a creatinine of 1.8 with a baseline like around 1.1-1.3).  Patient continues to have what appears to he hematuria.   Assessment/Plan:   AKI (acute kidney injury) ([place]) Baseline creatinine around 1.1-1.3 on admission 1.8 with gross hematuria. Continue IV fluids for an additional 12 hours basic metabolic panels pending. Renal ultrasound bilateral renal cortical thickening hydronephrosis. Out of bed to chair -recheck U.A -Cr improved   Hematuria recurrent UTIs in the setting of Xarelto: Continues to have gross hematuria, hematuria has not cleared.  He does relate that his dysuria has improved. Hold Xarelto   Urine cultures and blood cultures are NGTD -ID saw and d/c'd abx -daily labs Prior CT scan shows: Stable urinary bladder diverticulum with asymmetric, lateral urinary bladder wall thickening, as described above. While this may represent sequelae associated with cystitis, sequelae associated with an underlying neoplasm cannot be excluded. Urology consult and follow-up cystoscopy is recommended. -does not appear patient was referred to urology -h/h stable despite hematuria  Normocytic anemia/acute blood loss anemia: Hold Xarelto as his hematuria has not subsided CBC daily- h/h  stable  Chronic systolic heart failure with a last EF of 35%: Appears to be compensated, continue beta-blockers will be judicious with IV fluids. He is not on any diuretics at home. Continue to monitor strict I's and O's.  Diffuse large B cell lymphoma: Recurrent on April 2023 currently on epcoritamab with last dose 09/15/22  .  Chronic atrial fibrillation: Continue metoprolol and Xarelto.  COPD/obstructive sleep apnea: Compensated CPAP at night.  Hypothyroidism: Continue Synthroid.  Anxiety/depression: Continue Wellbutrin Cymbalta and Klonopin.   DVT prophylaxis: SCDs Family Communication:none Status is: inpt    Code Status:     Code Status Orders  (From admission, onward)           Start     Ordered   09/16/22 0456  Do not attempt resuscitation (DNR)  Continuous       Question Answer Comment  In the event of cardiac or respiratory ARREST Do not call a "code blue"   In the event of cardiac or respiratory ARREST Do not perform Intubation, CPR, defibrillation or ACLS   In the event of cardiac or respiratory ARREST Use medication by any route, position, wound care, and other measures to relive pain and suffering. May use oxygen, suction and manual treatment of airway obstruction as needed for comfort.   Comments verified with pt and pt's husband michael      09/16/22 0457           Subjective:    Overall is feeling more rested  Objective:    Vitals:   09/18/22 2240 09/19/22 0455 09/19/22 0521 09/19/22 0846  BP: (!) 106/56  115/68 (!) 105/59  Pulse: 87  85 89  Resp: '17  16 18  '$ Temp: 98.2 F (  36.8 C)  98.4 F (36.9 C) 98.7 F (37.1 C)  TempSrc:   Oral Oral  SpO2: 99%  98% 99%  Weight:  108 kg    Height:       SpO2: 99 %   Intake/Output Summary (Last 24 hours) at 09/19/2022 1048 Last data filed at 09/18/2022 1900 Gross per 24 hour  Intake 360 ml  Output 700 ml  Net -340 ml   Filed Weights   09/17/22 0256 09/18/22 0500 09/19/22 0455   Weight: 108.6 kg 108 kg 108 kg    Exam:  General: Appearance:     Overweight male who appears fatigues     Lungs:     Clear to auscultation bilaterally, respirations unlabored  Heart:    Normal heart rate.    MS:   All extremities are intact.   Neurologic:   Awake, alert     Data Reviewed:    Labs: Basic Metabolic Panel: Recent Labs  Lab 09/16/22 0127 09/16/22 0557 09/17/22 0749 09/18/22 0336 09/19/22 0329  NA 140 137 134* 135 137  K 4.7 4.5 4.1 4.1 4.2  CL 106 104 102 106 105  CO2 '23 26 23 22 '$ 21*  GLUCOSE 107* 103* 82 114* 89  BUN 38* 38* 32* 36* 34*  CREATININE 1.96* 1.84* 1.62* 1.47* 1.24  CALCIUM 8.2* 7.9* 7.9* 7.8* 7.7*  MG  --  1.9  --   --   --   PHOS  --  3.3  --   --   --    GFR Estimated Creatinine Clearance: 67.2 mL/min (by C-G formula based on SCr of 1.24 mg/dL). Liver Function Tests: Recent Labs  Lab 09/16/22 0127  AST 26  ALT 28  ALKPHOS 47  BILITOT 0.6  PROT 4.4*  ALBUMIN 2.2*   No results for input(s): "LIPASE", "AMYLASE" in the last 168 hours. No results for input(s): "AMMONIA" in the last 168 hours. Coagulation profile No results for input(s): "INR", "PROTIME" in the last 168 hours. COVID-19 Labs  No results for input(s): "DDIMER", "FERRITIN", "LDH", "CRP" in the last 72 hours.  Lab Results  Component Value Date   SARSCOV2NAA NEGATIVE 09/05/2022   SARSCOV2NAA NEGATIVE 01/06/2022   SARSCOV2NAA POSITIVE (A) 10/13/2021   [place] NEGATIVE 05/19/2021    CBC: Recent Labs  Lab 09/16/22 0127 09/16/22 0557 09/17/22 0749 09/18/22 0336 09/19/22 0329  WBC 7.5 6.2 6.6 7.0 7.0  NEUTROABS 5.4  --   --   --   --   HGB 12.4* 11.8* 12.2* 11.5* 12.2*  HCT 37.4* 36.8* 36.1* 35.4* 37.7*  MCV 94.2 95.6 92.6 93.9 95.0  PLT 145* 149* 134* 126* 122*   Cardiac Enzymes: Recent Labs  Lab 09/16/22 0557  CKTOTAL 47*   BNP (last 3 results) No results for input(s): "PROBNP" in the last 8760 hours. CBG: No results for input(s): "GLUCAP"  in the last 168 hours. D-Dimer: No results for input(s): "DDIMER" in the last 72 hours. Hgb A1c: No results for input(s): "HGBA1C" in the last 72 hours. Lipid Profile: No results for input(s): "CHOL", "HDL", "LDLCALC", "TRIG", "CHOLHDL", "LDLDIRECT" in the last 72 hours. Thyroid function studies: No results for input(s): "TSH", "T4TOTAL", "T3FREE", "THYROIDAB" in the last 72 hours.  Invalid input(s): "FREET3" Anemia work up: No results for input(s): "VITAMINB12", "FOLATE", "FERRITIN", "TIBC", "IRON", "RETICCTPCT" in the last 72 hours.  Sepsis Labs: Recent Labs  Lab 09/16/22 0557 09/17/22 0749 09/18/22 0336 09/19/22 0329  WBC 6.2 6.6 7.0 7.0   Microbiology Recent Results (  from the past 240 hour(s))  Urine Culture     Status: Abnormal   Collection Time: 09/16/22  3:35 AM   Specimen: Urine, Clean Catch  Result Value Ref Range Status   Specimen Description URINE, CLEAN CATCH  Final   Special Requests NONE  Final   Culture (A)  Final    >=100,000 COLONIES/mL DIPHTHEROIDS(CORYNEBACTERIUM SPECIES) Standardized susceptibility testing for this organism is not available. Performed at [facility], [place] [address], [place], [place] [address]    Report Status 09/17/2022 FINAL  Final  Culture, blood (Routine X 2) w Reflex to ID Panel     Status: None (Preliminary result)   Collection Time: 09/16/22  7:16 AM   Specimen: BLOOD  Result Value Ref Range Status   Specimen Description BLOOD BLOOD RIGHT HAND  Final   Special Requests   Final    BOTTLES DRAWN AEROBIC AND ANAEROBIC Blood Culture results may not be optimal due to an inadequate volume of blood received in culture bottles   Culture   Final    NO GROWTH 2 DAYS Performed at [facility], [place] [address] [place], [place] [address]    Report Status PENDING  Incomplete     Medications:    acyclovir  400 mg Oral BID   allopurinol  100 mg Oral BID   ascorbic acid  500 mg Oral Q lunch   atorvastatin  80 mg Oral  Daily   buPROPion  300 mg Oral Daily   DULoxetine  60 mg Oral BID   ezetimibe  10 mg Oral QHS   feeding supplement  237 mL Oral BID BM   levothyroxine  125 mcg Oral Q0600   liver oil-zinc oxide   Topical Daily   metoprolol tartrate  25 mg Oral BID   nutrition supplement (JUVEN)  1 packet Oral BID BM   pantoprazole  40 mg Oral Daily   Continuous Infusions:      LOS: 3 days   Geradine Girt  [facility]  09/19/2022, 10:48 AM

## 2022-09-20 ENCOUNTER — Ambulatory Visit (HOSPITAL_BASED_OUTPATIENT_CLINIC_OR_DEPARTMENT_OTHER): Payer: Medicare PPO | Admitting: Family

## 2022-09-20 LAB — CBC
HCT: 37.4 % — ABNORMAL LOW (ref 39.0–52.0)
Hemoglobin: 12 g/dL — ABNORMAL LOW (ref 13.0–17.0)
MCH: 30.5 pg (ref 26.0–34.0)
MCHC: 32.1 g/dL (ref 30.0–36.0)
MCV: 94.9 fL (ref 80.0–100.0)
Platelets: 129 10*3/uL — ABNORMAL LOW (ref 150–400)
RBC: 3.94 MIL/uL — ABNORMAL LOW (ref 4.22–5.81)
RDW: 20.9 % — ABNORMAL HIGH (ref 11.5–15.5)
WBC: 6.6 10*3/uL (ref 4.0–10.5)
nRBC: 0.3 % — ABNORMAL HIGH (ref 0.0–0.2)

## 2022-09-20 NOTE — Progress Notes (Signed)
Physical Therapy Treatment Patient Details Name: [name] MRN: [id number] DOB: [birth year] Today's Date: 09/20/2022   History of Present Illness Pt is a 76 y/o male who presented with weakness and falls at home with dysuria and gross hematuria. 2 recent admissions. PMH: a fib, BPH; CAD; chronic diastolic CHF; B cell lymphoma; HTN; HLD; OSA on CPAP; plaque psoriasis; and hypothyroidism    PT Comments    Pt reporting hunger upon PT arrival to room, states he has not had breakfast. Pt agreeable to OOB in preparation for arrival of food. Pt requiring min physical assist and RW maneuvering for transfer-level mobility at this time, pt declines gait training this date given fatigue and wanting to take "small steps" in recovery. Pt plans to d/c home with support of spouse, will continue to follow to progress mobility prior to d/c.      Recommendations for follow up therapy are one component of a multi-disciplinary discharge planning process, led by the attending physician.  Recommendations may be updated based on patient status, additional functional criteria and insurance authorization.  Follow Up Recommendations  Home health PT     Assistance Recommended at Discharge Intermittent Supervision/Assistance  Patient can return home with the following Assistance with cooking/housework;Assist for transportation;Help with stairs or ramp for entrance;A lot of help with walking and/or transfers;A little help with bathing/dressing/bathroom   Equipment Recommendations  None recommended by PT    Recommendations for Other Services       Precautions / Restrictions Precautions Precautions: Fall Restrictions Weight Bearing Restrictions: No     Mobility  Bed Mobility Overal bed mobility: Needs Assistance Bed Mobility: Supine to Sit     Supine to sit: Min assist, HOB elevated     General bed mobility comments: assist for trunk elevation via HHA, increased time and effort     Transfers Overall transfer level: Needs assistance Equipment used: Rolling walker (2 wheels) Transfers: Sit to/from Stand, Bed to chair/wheelchair/BSC Sit to Stand: Min assist Stand pivot transfers: Min assist         General transfer comment: assist for power up, rise, steadying upon standing, and pivotal steps to reach recliner. Min RW management assist as well    Ambulation/Gait         Gait velocity: decr     General Gait Details: nt - pt politely declines gait given hasn't been OOB since Wednesday, states "small steps" going to recliner first   Stairs             Wheelchair Mobility    Modified Rankin (Stroke Patients Only)       Balance Overall balance assessment: History of Falls, Needs assistance Sitting-balance support: No upper extremity supported, Feet supported Sitting balance-Leahy Scale: Good     Standing balance support: Bilateral upper extremity supported, During functional activity Standing balance-Leahy Scale: Poor Standing balance comment: reliant on PT and RW                            Cognition Arousal/Alertness: Awake/alert Behavior During Therapy: WFL for tasks assessed/performed Overall Cognitive Status: Within Functional Limits for tasks assessed                                          Exercises      General Comments General comments (skin integrity, edema, etc.): no reports of dizziness  with mobility      Pertinent Vitals/Pain Pain Assessment Pain Assessment: Faces Faces Pain Scale: Hurts a little bit Pain Location: generalized with mobility Pain Descriptors / Indicators: Grimacing Pain Intervention(s): Monitored during session, Limited activity within patient's tolerance, Repositioned    Home Living                          Prior Function            PT Goals (current goals can now be found in the care plan section) Acute Rehab PT Goals Patient Stated Goal: go home,  not come back PT Goal Formulation: With patient Time For Goal Achievement: 10/04/22 Potential to Achieve Goals: Fair Progress towards PT goals: Progressing toward goals    Frequency    Min 3X/week      PT Plan Current plan remains appropriate    Co-evaluation              AM-PAC PT "6 Clicks" Mobility   Outcome Measure  Help needed turning from your back to your side while in a flat bed without using bedrails?: A Little Help needed moving from lying on your back to sitting on the side of a flat bed without using bedrails?: A Little Help needed moving to and from a bed to a chair (including a wheelchair)?: A Little Help needed standing up from a chair using your arms (e.g., wheelchair or bedside chair)?: A Little Help needed to walk in hospital room?: A Lot Help needed climbing 3-5 steps with a railing? : Total 6 Click Score: 15    End of Session   Activity Tolerance: Patient tolerated treatment well Patient left: in chair;with call bell/phone within reach;with chair alarm set Nurse Communication: Mobility status PT Visit Diagnosis: Unsteadiness on feet (R26.81);Muscle weakness (generalized) (M62.81);Repeated falls (R29.6);Pain Pain - Right/Left: Left Pain - part of body: Hip     Time: 4235-3614 PT Time Calculation (min) (ACUTE ONLY): 26 min  Charges:  $Therapeutic Activity: 8-22 mins                    Stacie Glaze, PT DPT Acute Rehabilitation Services Pager [phone]  Office [phone]   Roxine Caddy E Ruffin Pyo 09/20/2022, 12:48 PM

## 2022-09-20 NOTE — Progress Notes (Signed)
Consultation Progress Note   Patient: [name] IRJ:[id number] DOB: [birth year] DOA: 09/15/2022 DOS: the patient was seen and examined on 09/20/2022 Primary service: [name], [name], MD  Brief hospital course: [name] is an 76 y.o. male past medical history of psoriasis, COPD, essential hypertension, chronic systolic heart failure atrial fibrillation on Xarelto recurrent UTI diffuse large B-cell lymphoma comes into the ED for generalized weakness dysuria, gross hematuria and a fall, he was recently treated for Proteus UTI and his urine symptoms resolved but shortly after completing the course became traumatic.  In the ED was found to be febrile UA showed many white blood cells red cells bacteria acute kidney injury (with a creatinine of 1.8 with a baseline like around 1.1-1.3).  Patient continues to have what appears to he hematuria.   Assessment and Plan: AKI (acute kidney injury) ([place]) Baseline creatinine around 1.1-1.3 on admission 1.8 with gross hematuria. Continue IV fluids for an additional 12 hours basic metabolic panels pending. Renal ultrasound bilateral renal cortical thickening hydronephrosis. Out of bed to chair -recheck U.A -Cr improved     Hematuria recurrent UTIs in the setting of Xarelto: Continues to have gross hematuria, hematuria has not cleared.  He does relate that his dysuria has improved. Hold Xarelto   Urine cultures and blood cultures are NGTD -ID saw and d/c'd abx -daily labs Prior CT scan shows: Stable urinary bladder diverticulum with asymmetric, lateral urinary bladder wall thickening, as described above. While this may represent sequelae associated with cystitis, sequelae associated with an underlying neoplasm cannot be excluded. Urology consult and follow-up cystoscopy is recommended. -does not appear patient was referred to urology -h/h stable despite hematuria   Normocytic anemia/acute blood loss anemia: Hold Xarelto as his hematuria  has not subsided CBC daily- h/h stable   Chronic systolic heart failure with a last EF of 35%: Appears to be compensated, continue beta-blockers will be judicious with IV fluids. He is not on any diuretics at home. Continue to monitor strict I's and O's.   Diffuse large B cell lymphoma: Recurrent on April 2023 currently on epcoritamab with last dose 09/15/22  .   Chronic atrial fibrillation: Continue metoprolol and Xarelto.   COPD/obstructive sleep apnea: Compensated CPAP at night.   Hypothyroidism: Continue Synthroid.   Anxiety/depression: Continue Wellbutrin Cymbalta and Klonopin.       TRH will continue to follow the patient.  Subjective: No complaints this morning.  Physical Exam: Constitutional: NAD, calm  Eyes: PERTLA, lids and conjunctivae normal ENMT: Mucous membranes are moist. Posterior pharynx clear of any exudate or lesions.   Neck: supple, no masses  Respiratory:  no wheezing, no crackles. No accessory muscle use.  Cardiovascular: S1 & S2 heard, regular rate and rhythm. No JVD. Abdomen: No distension, no tenderness, soft. Bowel sounds active.  Musculoskeletal: no clubbing / cyanosis. Distal RLE deformity.   Neurologic: CN 2-12 grossly intact. Strength 5/5 in all 4 limbs. Alert and oriented.  Psychiatric: Pleasant. Cooperative.   Vitals:   09/19/22 1702 09/20/22 0435 09/20/22 0451 09/20/22 0756  BP: (!) 94/54 (!) 103/58  (!) 100/49  Pulse: 91 88  96  Resp: '18 16  18  '$ Temp: 97.8 F (36.6 C) 98.7 F (37.1 C)  97.8 F (36.6 C)  TempSrc: Oral   Oral  SpO2: 99% 95%  97%  Weight:   107.8 kg   Height:        Data Reviewed:  There are no new results to review at this time.  Family Communication:   Time spent: 15 minutes.  Author: Cristela Felt, MD 09/20/2022 10:56 AM  For on call review [email or web address].

## 2022-09-20 NOTE — Progress Notes (Signed)
Occupational Therapy Treatment Patient Details Name: [name] MRN: [id number] DOB: [birth year] Today's Date: 09/20/2022   History of present illness Pt is a 76 y/o male who presented with weakness and falls at home with dysuria and gross hematuria. 2 recent admissions. PMH: a fib, BPH; CAD; chronic diastolic CHF; B cell lymphoma; HTN; HLD; OSA on CPAP; plaque psoriasis; and hypothyroidism   OT comments  Patient up in recliner stating he was needing to go back to bed. Patient declined self care tasks stating he was tired and just wanted to rest. Patient demonstrated sit to stands and transfer to EOB with min assists with RW and able to return to supine without assistance. Patient was led in [place] strengthening with green therapy band to increase functional strength to assist with self care. Patient continues to be appropriate for discharge home with [facility] for continued therapy to address LB dressing and toileting. Acute OT to continue to follow in this setting.    Recommendations for follow up therapy are one component of a multi-disciplinary discharge planning process, led by the attending physician.  Recommendations may be updated based on patient status, additional functional criteria and insurance authorization.    Follow Up Recommendations  Home health OT     Assistance Recommended at Discharge Intermittent Supervision/Assistance  Patient can return home with the following  A little help with walking and/or transfers;A little help with bathing/dressing/bathroom;Assistance with cooking/housework;Assist for transportation;Help with stairs or ramp for entrance   Equipment Recommendations  None recommended by OT    Recommendations for Other Services      Precautions / Restrictions Precautions Precautions: Fall Restrictions Weight Bearing Restrictions: No       Mobility Bed Mobility Overal bed mobility: Needs Assistance Bed Mobility: Sit to Supine       Sit to supine: Min  guard   General bed mobility comments: able to get to supine with cues and min guard    Transfers Overall transfer level: Needs assistance Equipment used: Rolling walker (2 wheels) Transfers: Sit to/from Stand, Bed to chair/wheelchair/BSC Sit to Stand: Min assist     Step pivot transfers: Min assist     General transfer comment: min assist to power up from recliner and min assist for balance during transfer     Balance Overall balance assessment: History of Falls, Needs assistance Sitting-balance support: No upper extremity supported, Feet supported Sitting balance-Leahy Scale: Good Sitting balance - Comments: able to sit on EOB unsupported   Standing balance support: Bilateral upper extremity supported, During functional activity Standing balance-Leahy Scale: Poor Standing balance comment: reliant on RW for balance                           ADL either performed or assessed with clinical judgement   ADL Overall ADL's : Needs assistance/impaired                                       General ADL Comments: Patient declined ADL tasks and focused on transfer to EOB, bed mobility, and UE HEP    Extremity/Trunk Assessment              Vision       Perception     Praxis      Cognition Arousal/Alertness: Awake/alert Behavior During Therapy: WFL for tasks assessed/performed Overall Cognitive Status: Within Functional Limits for tasks assessed  Exercises Exercises: General Upper Extremity General Exercises - Upper Extremity Shoulder Flexion: Strengthening, Both, 10 reps, Supine, Theraband Theraband Level (Shoulder Flexion): Level 3 (Green) Shoulder ABduction: Strengthening, Both, 15 reps, Supine, Theraband Theraband Level (Shoulder Abduction): Level 3 (Green) Elbow Flexion: Strengthening, Both, 10 reps, Supine, Theraband Theraband Level (Elbow Flexion): Level 3 (Green) Elbow  Extension: Strengthening, Both, 15 reps, Supine, Theraband Theraband Level (Elbow Extension): Level 3 (Green)    Shoulder Instructions       General Comments no reports of dizziness with mobility    Pertinent Vitals/ Pain       Pain Assessment Pain Assessment: Faces Faces Pain Scale: Hurts little more Pain Location: generalized with mobility Pain Descriptors / Indicators: Grimacing Pain Intervention(s): Monitored during session, Limited activity within patient's tolerance, Repositioned  Home Living                                          Prior Functioning/Environment              Frequency  Min 2X/week        Progress Toward Goals  OT Goals(current goals can now be found in the care plan section)  Progress towards OT goals: Progressing toward goals  Acute Rehab OT Goals Patient Stated Goal: get better OT Goal Formulation: With patient Time For Goal Achievement: 09/30/22 Potential to Achieve Goals: Good ADL Goals Pt Will Perform Lower Body Bathing: with supervision;sit to/from stand;sitting/lateral leans Pt Will Perform Lower Body Dressing: with supervision;sitting/lateral leans;sit to/from stand Pt Will Transfer to Toilet: with supervision;ambulating  Plan Discharge plan remains appropriate    Co-evaluation                 AM-PAC OT "6 Clicks" Daily Activity     Outcome Measure   Help from another person eating meals?: None Help from another person taking care of personal grooming?: A Little Help from another person toileting, which includes using toliet, bedpan, or urinal?: Total Help from another person bathing (including washing, rinsing, drying)?: A Lot Help from another person to put on and taking off regular upper body clothing?: A Little Help from another person to put on and taking off regular lower body clothing?: A Lot 6 Click Score: 15    End of Session Equipment Utilized During Treatment: Rolling walker (2  wheels)  OT Visit Diagnosis: Muscle weakness (generalized) (M62.81)   Activity Tolerance Patient tolerated treatment well   Patient Left in bed;with call bell/phone within reach;with bed alarm set   Nurse Communication Mobility status        Time: 2446-2863 OT Time Calculation (min): 25 min  Charges: OT General Charges $OT Visit: 1 Visit OT Treatments $Therapeutic Activity: 8-22 mins $Therapeutic Exercise: 8-22 mins  Lodema Hong, OTA Acute Rehabilitation Services  Office [phone]   Trixie Dredge 09/20/2022, 2:14 PM

## 2022-09-21 LAB — CULTURE, BLOOD (ROUTINE X 2): Culture: NO GROWTH

## 2022-09-21 NOTE — Assessment & Plan Note (Signed)
Continue levothyroxine 

## 2022-09-21 NOTE — Progress Notes (Signed)
  Progress Note   Patient: [name] CHY:[id number] DOB: Jul 19, 1946 DOA: 09/15/2022     5 DOS: the patient was seen and examined on 09/21/2022 at 9:55AM      Brief hospital course: [name] is an 76 y.o. male past medical history of psoriasis, COPD, essential hypertension, chronic systolic heart failure atrial fibrillation on Xarelto recurrent UTI diffuse large B-cell lymphoma comes into the ED for generalized weakness dysuria, gross hematuria and a fall, he was recently treated for Proteus UTI and his urine symptoms resolved but shortly after completing the course became traumatic.  In the ED was found to be febrile UA showed many white blood cells red cells bacteria acute kidney injury (with a creatinine of 1.8 with a baseline like around 1.1-1.3).  Patient continues to have what appears to he hematuria.      Assessment and Plan: * AKI (acute kidney injury) ([place]) Cr 1.8 at admission, from baseline 1.1 Given fluids, resolved.     Paroxysmal atrial fibrillation ([address]) - Hold Xarelto until Urology follow up - Continue metoprolol  Chronic systolic CHF (congestive heart failure) ([facility]) Appears euvolemic - Continue metoprolol  Diffuse large B-cell lymphoma ([facility]) Relapsed and widespread recurrence.  Seems his functional status is worsening.   - Follow up with [facility] at discharge  Chronic obstructive pulmonary disease ([place]) No active dsiease  Hypothyroidism - Continue levothyroxine  Gross hematuria Discussed with Urology; Hgb stable.  No obstructive symptoms - Hold Xarelto - Outpateitn follow up with Urology  Recurrent UTI Seen by ID this admission, recommended against treating Corynebacterium urine culture.  OSA (obstructive sleep apnea)    CAD (coronary artery disease) - Continue metop, atorvastatin, Zetia          Subjective: No fever, cnofusion, respiratory distress, pain. Very weak.       Physical Exam: BP 106/61 (BP Location: Right  Arm)   Pulse (!) 106   Temp 97.7 F (36.5 C) (Oral)   Resp 18   Ht '6\' 4"'$  (1.93 m)   Wt 107.8 kg   SpO2 99%   BMI 28.93 kg/m   Elderly adult male, appears debilitated, lying on the side RRR, no murmurs, no peripheral edema Respiratory rate normal, lungs clear without rales or wheezes Abdomen soft without tenderness palpation or guarding, no ascites or distention   Data Reviewed: Creatinine down to 1.2, CBC normal, urine culture with corynebacterium, renal ultrasound without hydronephrosis  Family Communication: Husband at the bedside    Disposition: Status is: Inpatient Patient was admitted with acute kidney injury.  He has also had gross hematuria.  Kidney injury was resolved, and the hematuria can be followed up as an outpatient.  However the patient is significantly debilitated from his baseline and cannot return to his prior level of independence.        Author: Edwin Dada, MD 09/21/2022 3:00 PM  For on call review [email or web address].

## 2022-09-21 NOTE — Progress Notes (Signed)
Physical Therapy Treatment Patient Details Name: [name] MRN: [id number] DOB: 1946-09-16 Today's Date: 09/21/2022   History of Present Illness Pt is a 76 y/o male who presented with weakness and falls at home with dysuria and gross hematuria. 2 recent admissions. PMH: a fib, BPH; CAD; chronic diastolic CHF; B cell lymphoma; HTN; HLD; OSA on CPAP; plaque psoriasis; and hypothyroidism    PT Comments    Pt sleeping upon PT arrival to room, wakes and agreeable to PT. Pt overall requiring min physical assist for transfers and short-distance gait, pt very unsteady with x1 significant posterior LOB requiring PT intervention to correct. PT feels pt would benefit from ST-SNF prior to d/c home to return to PLOF, pt states at home he walks household distances multiple times a day without assist. PT to continue to follow.      Recommendations for follow up therapy are one component of a multi-disciplinary discharge planning process, led by the attending physician.  Recommendations may be updated based on patient status, additional functional criteria and insurance authorization.  Follow Up Recommendations  Skilled nursing-short term rehab (<3 hours/day) Can patient physically be transported by private vehicle: Yes   Assistance Recommended at Discharge Frequent or constant Supervision/Assistance  Patient can return home with the following Assistance with cooking/housework;Assist for transportation;Help with stairs or ramp for entrance;A lot of help with walking and/or transfers;A little help with bathing/dressing/bathroom   Equipment Recommendations  None recommended by PT    Recommendations for Other Services       Precautions / Restrictions Precautions Precautions: Fall Restrictions Weight Bearing Restrictions: No     Mobility  Bed Mobility Overal bed mobility: Needs Assistance Bed Mobility: Supine to Sit     Supine to sit: HOB elevated, Supervision     General bed mobility  comments: for safety, pt with use of bedrail and incerased time    Transfers Overall transfer level: Needs assistance Equipment used: Rolling walker (2 wheels) Transfers: Sit to/from Stand Sit to Stand: Min assist           General transfer comment: assist for rise and steady, posterior bias upon initial stand    Ambulation/Gait Ambulation/Gait assistance: Min assist Gait Distance (Feet): 20 Feet Assistive device: Rolling walker (2 wheels) Gait Pattern/deviations: Step-through pattern, Trunk flexed, Shuffle Gait velocity: decr     General Gait Details: assist to steady, 1 significant posterior LOB requiring PT intervention to correct   Stairs             Wheelchair Mobility    Modified Rankin (Stroke Patients Only)       Balance Overall balance assessment: History of Falls, Needs assistance Sitting-balance support: No upper extremity supported, Feet supported Sitting balance-Leahy Scale: Good Sitting balance - Comments: able to sit on EOB unsupported   Standing balance support: Bilateral upper extremity supported, During functional activity Standing balance-Leahy Scale: Poor Standing balance comment: reliant on RW for balance                            Cognition Arousal/Alertness: Awake/alert Behavior During Therapy: WFL for tasks assessed/performed Overall Cognitive Status: Within Functional Limits for tasks assessed                                 General Comments: anxiety with mobility        Exercises      General Comments  Pertinent Vitals/Pain Pain Assessment Pain Assessment: Faces Faces Pain Scale: Hurts little more Pain Location: generalized with mobility Pain Intervention(s): Limited activity within patient's tolerance, Monitored during session, Repositioned    Home Living                          Prior Function            PT Goals (current goals can now be found in the care plan  section) Acute Rehab PT Goals Patient Stated Goal: go home, not come back PT Goal Formulation: With patient Time For Goal Achievement: 10/04/22 Potential to Achieve Goals: Fair Progress towards PT goals: Progressing toward goals    Frequency    Min 3X/week      PT Plan Discharge plan needs to be updated    Co-evaluation              AM-PAC PT "6 Clicks" Mobility   Outcome Measure  Help needed turning from your back to your side while in a flat bed without using bedrails?: A Little Help needed moving from lying on your back to sitting on the side of a flat bed without using bedrails?: A Little Help needed moving to and from a bed to a chair (including a wheelchair)?: A Little Help needed standing up from a chair using your arms (e.g., wheelchair or bedside chair)?: A Little Help needed to walk in hospital room?: A Lot Help needed climbing 3-5 steps with a railing? : A Lot 6 Click Score: 16    End of Session   Activity Tolerance: Patient tolerated treatment well;Patient limited by fatigue Patient left: in chair;with call bell/phone within reach;with chair alarm set Nurse Communication: Mobility status PT Visit Diagnosis: Unsteadiness on feet (R26.81);Muscle weakness (generalized) (M62.81);Repeated falls (R29.6);Pain Pain - Right/Left: Left Pain - part of body: Hip     Time: 9628-3662 PT Time Calculation (min) (ACUTE ONLY): 32 min  Charges:  $Gait Training: 8-22 mins $Therapeutic Activity: 8-22 mins                    Stacie Glaze, PT DPT Acute Rehabilitation Services Pager [phone]  Office [phone]    [place] 09/21/2022, 5:01 PM

## 2022-09-21 NOTE — Assessment & Plan Note (Signed)
Seen by ID this admission, recommended against treating Corynebacterium urine culture.

## 2022-09-21 NOTE — Assessment & Plan Note (Signed)
-   Hold Xarelto until Urology follow up - Continue metoprolol

## 2022-09-21 NOTE — Assessment & Plan Note (Signed)
Appears euvolemic - Continue metoprolol

## 2022-09-21 NOTE — Assessment & Plan Note (Signed)
Discussed with Urology; Hgb stable.  No obstructive symptoms - Hold Xarelto - Outpateitn follow up with Urology

## 2022-09-21 NOTE — Assessment & Plan Note (Signed)
No active dsiease

## 2022-09-21 NOTE — Assessment & Plan Note (Signed)
Cr 1.8 at admission, from baseline 1.1 Given fluids, resolved.

## 2022-09-21 NOTE — Assessment & Plan Note (Signed)
-   Continue metop, atorvastatin, Zetia

## 2022-09-21 NOTE — Assessment & Plan Note (Signed)
Resolved

## 2022-09-21 NOTE — Hospital Course (Addendum)
[name] is a 76 y.o. M with DLBCL on third line therapy, HTN, sCHF E 35-40%, pAF on Xarelto, nephrolithiasis, recurrent ESBL UTI, and hypothyroidism who presented with generalized weakness, dysuria and a fall, and was found to have AKI.    12/7: Admitted on fluids; ID consulted

## 2022-09-21 NOTE — Assessment & Plan Note (Signed)
Relapsed and widespread recurrence.  Seems his functional status is worsening.   - Follow up with [facility] at discharge

## 2022-09-21 NOTE — [place] FL2 (Signed)
[place] LEVEL OF CARE FORM     IDENTIFICATION  Patient Name: [name] Birthdate: 26-Dec-1945 Sex: male Admission Date (Current Location): 09/15/2022  [facility] and [place] Number:  Herbalist and Address:  [facility], [place] [address], [place], [place] [address]      Provider Number: [id number]  Attending Physician Name and Address:  Edwin Dada, *  Relative Name and Phone Number:       Current Level of Care: SNF Recommended Level of Care: [place] Prior Approval Number:    Date Approved/Denied:   PASRR Number: [id number]  Discharge Plan: SNF    Current Diagnoses: Patient Active Problem List   Diagnosis Date Noted   Cytokine release syndrome 09/17/2022   Hypotension 09/17/2022   Gross hematuria 09/16/2022   Acute kidney injury ([place]) 09/16/2022   Syncope 09/05/2022   Orthostasis 07/23/2022   Malnutrition of moderate degree 07/22/2022   Recurrent UTI 06/02/2022   Diffuse large B cell lymphoma ([place]) 05/24/2022   Recurrent falls 05/20/2022   Orthostatic hypotension 04/27/2022   Sepsis secondary to UTI ([place]) 04/22/2022   Encounter for antineoplastic chemotherapy    AKI (acute kidney injury) ([place]) 03/19/2022   DNR (do not resuscitate)/DNI(Do Not Intubate) 03/19/2022   Lung mass 03/06/2022   Stage 3a chronic kidney disease (CKD) ([place]) - Baseline Scr 1.5 - 1.6 03/06/2022   Allergic rhinitis 01/25/2022   Allergic rhinitis due to animal (cat) (dog) hair and dander 01/25/2022   Allergic rhinitis due to pollen 01/25/2022   Mild intermittent asthma 01/25/2022   Vasomotor rhinitis 01/25/2022   Multiple skin nodules 01/25/2022   Weight loss 01/25/2022   Pure hypercholesterolemia 02/54/2706   Chronic systolic CHF (congestive heart failure) ([place]) 06/21/2019   H/O nephrolithotomy with removal of calculi 03/26/2019   Renal calculus 03/26/2019   History of chemotherapy 11/21/2018   Lymphoma in  remission ([place]) 11/21/2018   Generalized weakness 11/08/2018   Well adult exam 10/02/2018   Counseling regarding advance care planning and goals of care 07/09/2018   Diffuse large B-cell lymphoma ([place]) 06/23/2018   Port-A-Cath in place 06/23/2018   Hypogonadism in male 11/22/2017   Nephrolithiasis 10/24/2017   Recurrent dislocation of left patella 07/15/2017   Recurrent subluxation of patella, left 07/08/2017   At high risk for falls 05/17/2017   S/P revision of total knee 04/11/2017   Failed total knee, left, initial encounter ([place]) 04/09/2017   Renal calculus, right 02/08/2017   Protein-calorie malnutrition, severe ([place]) 12/08/2016   Chronic obstructive pulmonary disease ([place]) 12/08/2016   Staghorn renal calculus 11/05/2016   Senile osteoporosis 06/08/2016   Dyslipidemia 01/12/2016   Insomnia secondary to depression with anxiety 12/02/2015   Generalized anxiety disorder 11/19/2015   Wound drainage right hip 11/15/2013   Expected blood loss anemia 09/18/2013   S/P right TH revision 09/17/2013   BPH with obstruction/lower urinary tract symptoms 08/03/2013   History of gastroesophageal reflux (GERD) 08/03/2013   OSA (obstructive sleep apnea) 08/03/2013   Paroxysmal atrial fibrillation ([place]) 08/03/2013   Presence of unspecified artificial hip joint 08/03/2013   Nonunion, fracture, Right tibia  07/03/2013   UTI (urinary tract infection) 07/03/2013   Chronic anticoagulation 02/21/2013   Fall 02/08/2013   Physical deconditioning 02/08/2013   Low back pain radiating to both legs 02/08/2013   Compression fracture of L1 lumbar vertebra ([place]) 02/08/2013   Morbid obesity ([place]) 02/05/2013   Anemia 10/06/2012   Essential hypertension 10/03/2012  Psoriasis 10/03/2012   Vitamin D deficiency 10/03/2012   GERD (gastroesophageal reflux disease) 10/03/2012   Anxiety disorder 10/03/2012   Depression 10/03/2012   CAD (coronary artery disease) 10/03/2012   DJD (degenerative joint disease)  10/03/2012   Chronic gingivitis 10/03/2012   Low testosterone, possible hypogonadism 10/01/2012   Normocytic anemia 09/30/2012   Longstanding persistent atrial fibrillation ([place]) 09/29/2012   Hypothyroidism 09/29/2012   History of recurrent UTIs 09/29/2012    Orientation RESPIRATION BLADDER Height & Weight     Self, Time, Situation, Place  Normal Continent, External catheter Weight: 237 lb 10.5 oz (107.8 kg) Height:  '6\' 4"'$  (193 cm)  BEHAVIORAL SYMPTOMS/MOOD NEUROLOGICAL BOWEL NUTRITION STATUS      Continent Diet  AMBULATORY STATUS COMMUNICATION OF NEEDS Skin   Limited Assist Verbally Normal                       Personal Care Assistance Level of Assistance  Bathing, Dressing, Feeding Bathing Assistance: Limited assistance Feeding assistance: Limited assistance Dressing Assistance: Limited assistance     Functional Limitations Info  Sight, Hearing, Speech Sight Info: Adequate Hearing Info: Adequate Speech Info: Adequate    SPECIAL CARE FACTORS FREQUENCY  PT (By licensed PT), OT (By licensed OT)     PT Frequency: 5x weekly OT Frequency: 5x weekly            Contractures Contractures Info: Not present    Additional Factors Info  Code Status, Allergies, Isolation Precautions Code Status Info: DNR Allergies Info: Short Ragweed Pollen Ext     Isolation Precautions Info: ESBL     Current Medications (09/21/2022):  This is the current hospital active medication list Current Facility-Administered Medications  Medication Dose Route Frequency Provider Last Rate Last Admin   acetaminophen (TYLENOL) tablet 650 mg  650 mg Oral Q6H PRN Opyd, Ilene Qua, MD       Or   acetaminophen (TYLENOL) suppository 650 mg  650 mg Rectal Q6H PRN Opyd, Ilene Qua, MD       acyclovir (ZOVIRAX) tablet 400 mg  400 mg Oral BID Opyd, Ilene Qua, MD   400 mg at 09/21/22 0906   albuterol (PROVENTIL) (2.5 MG/3ML) 0.083% nebulizer solution 2.5 mg  2.5 mg Inhalation Q6H PRN Opyd, Ilene Qua,  MD       allopurinol (ZYLOPRIM) tablet 100 mg  100 mg Oral BID Opyd, Ilene Qua, MD   100 mg at 09/21/22 5621   ascorbic acid (VITAMIN C) tablet 500 mg  500 mg Oral Q lunch Charlynne Cousins, MD   500 mg at 09/20/22 1425   atorvastatin (LIPITOR) tablet 80 mg  80 mg Oral Daily Charlynne Cousins, MD   80 mg at 09/21/22 0905   buPROPion (WELLBUTRIN XL) 24 hr tablet 300 mg  300 mg Oral Daily Opyd, Ilene Qua, MD   300 mg at 09/21/22 0905   clonazePAM (KLONOPIN) tablet 1 mg  1 mg Oral TID PRN Vianne Bulls, MD   1 mg at 09/20/22 2221   DULoxetine (CYMBALTA) DR capsule 60 mg  60 mg Oral BID Vianne Bulls, MD   60 mg at 09/21/22 0905   ezetimibe (ZETIA) tablet 10 mg  10 mg Oral QHS Charlynne Cousins, MD   10 mg at 09/20/22 2221   feeding supplement (ENSURE ENLIVE / ENSURE PLUS) liquid 237 mL  237 mL Oral BID BM Charlynne Cousins, MD   237 mL at 09/19/22 1404   levothyroxine (SYNTHROID)  tablet 125 mcg  125 mcg Oral Q0600 Vianne Bulls, MD   125 mcg at 09/21/22 0508   liver oil-zinc oxide (DESITIN) 40 % ointment   Topical Daily Charlynne Cousins, MD   Given at 09/20/22 1658   metoprolol tartrate (LOPRESSOR) tablet 25 mg  25 mg Oral BID Vianne Bulls, MD   25 mg at 09/21/22 7322   nutrition supplement (JUVEN) (JUVEN) powder packet 1 packet  1 packet Oral BID BM Charlynne Cousins, MD   1 packet at 09/20/22 1425   ondansetron (ZOFRAN) tablet 4 mg  4 mg Oral Q6H PRN Opyd, Ilene Qua, MD       Or   ondansetron (ZOFRAN) injection 4 mg  4 mg Intravenous Q6H PRN Opyd, Ilene Qua, MD       oxyCODONE (Oxy IR/ROXICODONE) immediate release tablet 5 mg  5 mg Oral Q4H PRN Opyd, Ilene Qua, MD       pantoprazole (PROTONIX) EC tablet 40 mg  40 mg Oral Daily Opyd, Ilene Qua, MD   40 mg at 09/21/22 0254     Discharge Medications: Please see discharge summary for a list of discharge medications.  Relevant Imaging Results:  Relevant Lab Results:   Additional Information SSN:  [id number]  Archie Endo, LCSW

## 2022-09-21 NOTE — Progress Notes (Addendum)
1:30pm: CSW spoke with patient and husband at bedside to provide them with updates.  12:30pm: Patient has been offered a bed at [facility].  CSW will initiate insurance authorization once new therapy notes are available.  12pm: CSW and Dr. Loleta Books spoke with patient and his husband at bedside. Patient and husband both state that returning home with home health through [facility] is not the best plan at this time. Patient and husband both agree their first facility choice is [facility].  CSW will complete FL2 and fax information to local facilities for review.  Madilyn Fireman, MSW, LCSW Transitions of Care  Clinical Social Worker II [phone]

## 2022-09-21 NOTE — Plan of Care (Signed)

## 2022-09-22 NOTE — Progress Notes (Addendum)
12:55pm: Patient's insurance authorization is pending at this time.  11:30am: Patient's insurance authorization is pending at this time.  8:50am: CSW submitted insurance authorization request to [place].  Madilyn Fireman, MSW, LCSW Transitions of Care  Clinical Social Worker II [phone]

## 2022-09-22 NOTE — Progress Notes (Signed)
  Progress Note   Patient: [name] TMH:[id number] DOB: [birth year] DOA: 09/15/2022     6 DOS: the patient was seen and examined on 09/22/2022 at 8:42AM      Brief hospital course: [name] is a 76 y.o. M with DLBCL on third line therapy, HTN, sCHF E 35-40%, pAF on Xarelto, nephrolithiasis, recurrent ESBL UTI, and hypothyroidism who presented with generalized weakness, dysuria and a fall, and was found to have AKI.    12/7: Admitted on fluids; ID consulted       Assessment and Plan: * AKI (acute kidney injury) ([place]) Cr 1.8 at admission, from baseline 1.1 Given fluids, resolved.     Paroxysmal atrial fibrillation ([place]) - Hold Xarelto until Urology follow up - Continue metoprolol  Chronic systolic CHF (congestive heart failure) (H[facility]) Appears euvolemic - Continue metoprolol  Diffuse large B-cell lymphoma (H[facility]) Relapsed and widespread recurrence.  Seems his functional status is worsening.   - Follow up with [facility] at discharge  Chronic obstructive pulmonary disease ([place]) No active dsiease  Hypothyroidism - Continue levothyroxine  Gross hematuria Discussed with Urology; Hgb stable.  No obstructive symptoms - Hold Xarelto - Outpateitn follow up with Urology  Recurrent UTI Seen by ID this admission, recommended against treating Corynebacterium urine culture.  OSA (obstructive sleep apnea)    CAD (coronary artery disease) - Continue metop, atorvastatin, Zetia          Subjective: No change.  Appetite good.  No fever, no confusion.     Physical Exam: BP 136/87 (BP Location: Right Arm)   Pulse 82   Temp 98.3 F (36.8 C) (Oral)   Resp 18   Ht '6\' 4"'$  (1.93 m)   Wt 107.8 kg   SpO2 (!) 77%   BMI 28.93 kg/m   Elderly adult male, lying in bed, no acute distress RRR, no murmurs, no peripheral edema Respiratory rate normal, lungs clear without rales or wheezes Abdomen soft no tenderness  Appears extremely debilitated    Data  Reviewed: No new labs  Family Communication: None present    Disposition: Status is: Inpatient Patient admitted for acute kidney injury.  He is severely debilitated and will require significant rehabilitation return to his prior level of function.  Medically ready for discharge to [facility] when Bed available        Author: Edwin Dada, MD 09/22/2022 3:01 PM  For on call review [email or web address].

## 2022-09-23 DIAGNOSIS — I2510 Atherosclerotic heart disease of native coronary artery without angina pectoris: Secondary | ICD-10-CM | POA: Diagnosis not present

## 2022-09-23 DIAGNOSIS — J3081 Allergic rhinitis due to animal (cat) (dog) hair and dander: Secondary | ICD-10-CM | POA: Diagnosis not present

## 2022-09-23 DIAGNOSIS — D638 Anemia in other chronic diseases classified elsewhere: Secondary | ICD-10-CM | POA: Diagnosis not present

## 2022-09-23 DIAGNOSIS — I4891 Unspecified atrial fibrillation: Secondary | ICD-10-CM | POA: Diagnosis not present

## 2022-09-23 DIAGNOSIS — D630 Anemia in neoplastic disease: Secondary | ICD-10-CM | POA: Diagnosis not present

## 2022-09-23 DIAGNOSIS — R310 Gross hematuria: Secondary | ICD-10-CM | POA: Diagnosis not present

## 2022-09-23 DIAGNOSIS — C8338 Diffuse large B-cell lymphoma, lymph nodes of multiple sites: Secondary | ICD-10-CM | POA: Diagnosis not present

## 2022-09-23 DIAGNOSIS — R3914 Feeling of incomplete bladder emptying: Secondary | ICD-10-CM | POA: Diagnosis not present

## 2022-09-23 DIAGNOSIS — F331 Major depressive disorder, recurrent, moderate: Secondary | ICD-10-CM | POA: Diagnosis not present

## 2022-09-23 DIAGNOSIS — J3089 Other allergic rhinitis: Secondary | ICD-10-CM | POA: Diagnosis not present

## 2022-09-23 DIAGNOSIS — R41841 Cognitive communication deficit: Secondary | ICD-10-CM | POA: Diagnosis not present

## 2022-09-23 DIAGNOSIS — Z792 Long term (current) use of antibiotics: Secondary | ICD-10-CM | POA: Diagnosis not present

## 2022-09-23 DIAGNOSIS — D509 Iron deficiency anemia, unspecified: Secondary | ICD-10-CM | POA: Diagnosis not present

## 2022-09-23 DIAGNOSIS — E039 Hypothyroidism, unspecified: Secondary | ICD-10-CM | POA: Diagnosis not present

## 2022-09-23 DIAGNOSIS — I480 Paroxysmal atrial fibrillation: Secondary | ICD-10-CM | POA: Diagnosis not present

## 2022-09-23 DIAGNOSIS — C8339 Diffuse large B-cell lymphoma, extranodal and solid organ sites: Secondary | ICD-10-CM | POA: Diagnosis not present

## 2022-09-23 DIAGNOSIS — C8510 Unspecified B-cell lymphoma, unspecified site: Secondary | ICD-10-CM | POA: Diagnosis not present

## 2022-09-23 DIAGNOSIS — N390 Urinary tract infection, site not specified: Secondary | ICD-10-CM | POA: Diagnosis not present

## 2022-09-23 DIAGNOSIS — D649 Anemia, unspecified: Secondary | ICD-10-CM | POA: Diagnosis not present

## 2022-09-23 DIAGNOSIS — F411 Generalized anxiety disorder: Secondary | ICD-10-CM | POA: Diagnosis not present

## 2022-09-23 DIAGNOSIS — I5032 Chronic diastolic (congestive) heart failure: Secondary | ICD-10-CM | POA: Diagnosis not present

## 2022-09-23 DIAGNOSIS — Z79899 Other long term (current) drug therapy: Secondary | ICD-10-CM | POA: Diagnosis not present

## 2022-09-23 DIAGNOSIS — Z87891 Personal history of nicotine dependence: Secondary | ICD-10-CM | POA: Diagnosis not present

## 2022-09-23 DIAGNOSIS — R531 Weakness: Secondary | ICD-10-CM | POA: Diagnosis not present

## 2022-09-23 DIAGNOSIS — R319 Hematuria, unspecified: Secondary | ICD-10-CM | POA: Diagnosis not present

## 2022-09-23 DIAGNOSIS — F39 Unspecified mood [affective] disorder: Secondary | ICD-10-CM | POA: Diagnosis not present

## 2022-09-23 DIAGNOSIS — R4182 Altered mental status, unspecified: Secondary | ICD-10-CM | POA: Diagnosis not present

## 2022-09-23 DIAGNOSIS — N3289 Other specified disorders of bladder: Secondary | ICD-10-CM | POA: Diagnosis not present

## 2022-09-23 DIAGNOSIS — N400 Enlarged prostate without lower urinary tract symptoms: Secondary | ICD-10-CM | POA: Diagnosis not present

## 2022-09-23 DIAGNOSIS — F32A Depression, unspecified: Secondary | ICD-10-CM | POA: Diagnosis not present

## 2022-09-23 DIAGNOSIS — F410 Panic disorder [episodic paroxysmal anxiety] without agoraphobia: Secondary | ICD-10-CM | POA: Diagnosis not present

## 2022-09-23 DIAGNOSIS — R2689 Other abnormalities of gait and mobility: Secondary | ICD-10-CM | POA: Diagnosis not present

## 2022-09-23 DIAGNOSIS — E291 Testicular hypofunction: Secondary | ICD-10-CM | POA: Diagnosis not present

## 2022-09-23 DIAGNOSIS — N179 Acute kidney failure, unspecified: Secondary | ICD-10-CM | POA: Diagnosis not present

## 2022-09-23 DIAGNOSIS — S31829D Unspecified open wound of left buttock, subsequent encounter: Secondary | ICD-10-CM | POA: Diagnosis not present

## 2022-09-23 DIAGNOSIS — R918 Other nonspecific abnormal finding of lung field: Secondary | ICD-10-CM | POA: Diagnosis not present

## 2022-09-23 DIAGNOSIS — J301 Allergic rhinitis due to pollen: Secondary | ICD-10-CM | POA: Diagnosis not present

## 2022-09-23 DIAGNOSIS — N1831 Chronic kidney disease, stage 3a: Secondary | ICD-10-CM | POA: Diagnosis not present

## 2022-09-23 DIAGNOSIS — C8330 Diffuse large B-cell lymphoma, unspecified site: Secondary | ICD-10-CM | POA: Diagnosis not present

## 2022-09-23 DIAGNOSIS — E782 Mixed hyperlipidemia: Secondary | ICD-10-CM | POA: Diagnosis not present

## 2022-09-23 DIAGNOSIS — C7801 Secondary malignant neoplasm of right lung: Secondary | ICD-10-CM | POA: Diagnosis not present

## 2022-09-23 DIAGNOSIS — N3021 Other chronic cystitis with hematuria: Secondary | ICD-10-CM | POA: Diagnosis not present

## 2022-09-23 DIAGNOSIS — E871 Hypo-osmolality and hyponatremia: Secondary | ICD-10-CM | POA: Diagnosis not present

## 2022-09-23 DIAGNOSIS — L98419 Non-pressure chronic ulcer of buttock with unspecified severity: Secondary | ICD-10-CM | POA: Diagnosis not present

## 2022-09-23 DIAGNOSIS — Z5111 Encounter for antineoplastic chemotherapy: Secondary | ICD-10-CM | POA: Diagnosis not present

## 2022-09-23 DIAGNOSIS — I130 Hypertensive heart and chronic kidney disease with heart failure and stage 1 through stage 4 chronic kidney disease, or unspecified chronic kidney disease: Secondary | ICD-10-CM | POA: Diagnosis not present

## 2022-09-23 DIAGNOSIS — R400 Somnolence: Secondary | ICD-10-CM | POA: Diagnosis not present

## 2022-09-23 DIAGNOSIS — I959 Hypotension, unspecified: Secondary | ICD-10-CM | POA: Diagnosis not present

## 2022-09-23 DIAGNOSIS — W19XXXA Unspecified fall, initial encounter: Secondary | ICD-10-CM | POA: Diagnosis not present

## 2022-09-23 DIAGNOSIS — I951 Orthostatic hypotension: Secondary | ICD-10-CM | POA: Diagnosis not present

## 2022-09-23 DIAGNOSIS — I700 Atherosclerosis of aorta: Secondary | ICD-10-CM | POA: Diagnosis not present

## 2022-09-23 DIAGNOSIS — Z743 Need for continuous supervision: Secondary | ICD-10-CM | POA: Diagnosis not present

## 2022-09-23 DIAGNOSIS — Z4800 Encounter for change or removal of nonsurgical wound dressing: Secondary | ICD-10-CM | POA: Diagnosis not present

## 2022-09-23 DIAGNOSIS — K219 Gastro-esophageal reflux disease without esophagitis: Secondary | ICD-10-CM | POA: Diagnosis not present

## 2022-09-23 DIAGNOSIS — I10 Essential (primary) hypertension: Secondary | ICD-10-CM | POA: Diagnosis not present

## 2022-09-23 DIAGNOSIS — M6281 Muscle weakness (generalized): Secondary | ICD-10-CM | POA: Diagnosis not present

## 2022-09-23 DIAGNOSIS — I5022 Chronic systolic (congestive) heart failure: Secondary | ICD-10-CM | POA: Diagnosis not present

## 2022-09-23 DIAGNOSIS — C7989 Secondary malignant neoplasm of other specified sites: Secondary | ICD-10-CM | POA: Diagnosis not present

## 2022-09-23 DIAGNOSIS — F1090 Alcohol use, unspecified, uncomplicated: Secondary | ICD-10-CM | POA: Diagnosis not present

## 2022-09-23 DIAGNOSIS — Z79624 Long term (current) use of inhibitors of nucleotide synthesis: Secondary | ICD-10-CM | POA: Diagnosis not present

## 2022-09-23 DIAGNOSIS — Z87440 Personal history of urinary (tract) infections: Secondary | ICD-10-CM | POA: Diagnosis not present

## 2022-09-23 DIAGNOSIS — R5381 Other malaise: Secondary | ICD-10-CM | POA: Diagnosis not present

## 2022-09-23 DIAGNOSIS — I509 Heart failure, unspecified: Secondary | ICD-10-CM | POA: Diagnosis not present

## 2022-09-23 DIAGNOSIS — J449 Chronic obstructive pulmonary disease, unspecified: Secondary | ICD-10-CM | POA: Diagnosis not present

## 2022-09-23 DIAGNOSIS — R000 Tachycardia, unspecified: Secondary | ICD-10-CM | POA: Diagnosis not present

## 2022-09-23 NOTE — Care Management Important Message (Signed)
Important Message  Patient Details  Name: [name] MRN: [id number] Date of Birth: [birth year]   Medicare Important Message Given:  Yes  Patient left prior to IM delivery will mail to the patient home address   Orbie Pyo 09/23/2022, 3:06 PM

## 2022-09-23 NOTE — Progress Notes (Addendum)
1pm: Patient will go to room 3225 at [facility]. The number to call for report is [phone].  CSW arranged for transportation to the facility via [facility] - pick up at approximately 3pm.  9:20am: CSW spoke with [place] who states the patient's husband will complete paperwork at 10am.  8:30am: CSW spoke with Narda Rutherford at [facility] to inform her that the authorization has been approved. Narda Rutherford will contact the patient's husband to complete admission paperwork.  Madilyn Fireman, MSW, LCSW Transitions of Care  Clinical Social Worker II [phone]

## 2022-09-23 NOTE — Progress Notes (Addendum)
Report given to Engineer, maintenance (IT) at [facility].  Wheelchair transport set up for patient

## 2022-09-23 NOTE — Discharge Summary (Signed)
Physician Discharge Summary   Patient: [name] MRN: [id number] DOB: 1946-02-18  Admit date:     09/15/2022  Discharge date: 09/23/22  Discharge Physician: Edwin Dada   PCP: Cassandria Anger, MD     Recommendations at discharge:  Follow up with Urology Dr. Junious Silk for cystoscopy as soon as able Repeat CBC in 1 week Dr. Junious Silk:  Please resume Xarelto when safe from Urological perspective     Discharge Diagnoses: Principal Problem:   AKI (acute kidney injury) ([place]) Active Problems:   Hypothyroidism   Chronic obstructive pulmonary disease ([place])   Diffuse large B-cell lymphoma ([place])   Chronic systolic CHF (congestive heart failure) (H[facility])   Paroxysmal atrial fibrillation ([facility])   CAD (coronary artery disease)   OSA (obstructive sleep apnea)   Recurrent UTI   Gross hematuria      Hospital Course: [name] is a 76 y.o. M with DLBCL on third line therapy, HTN, sCHF E 35-40%, pAF on Xarelto, nephrolithiasis, recurrent ESBL UTI, and hypothyroidism who presented with generalized weakness, dysuria and a fall, and was found to have AKI.          * AKI (acute kidney injury) ([place]) Cr 1.8 at admission, from baseline 1.1  Given fluids, resolved.    Gross hematuria Discussed with Urology; Hgb stable.  No obstructive symptoms.  Xarelto held.  CT from 11/26 during last hospitalization showed " urinary bladder diverticulum with asymmetric, lateral urinary bladder wall thickening ... While this may represent sequelae associated with cystitis, sequelae associated with an underlying neoplasm cannot be excluded. ... follow-up cystoscopy is recommended."  This was discussed with Urology who have arranged follow up closely in clinic.   Paroxysmal atrial fibrillation ([place]) Hold Xarelto until Urology follow up  Diffuse large B-cell lymphoma ([place]) Relapsed.  Seems his functional status is worsening.   - Follow up with [facility] at  discharge  Chronic systolic CHF (congestive heart failure) ([place]) Appeared euvolemic  Recurrent UTI Seen by ID this admission, recommended against treating Corynebacterium urine culture.           The [facility] Controlled Substances Registry was reviewed for this patient prior to discharge.  Consultants: Urology, ID   Disposition: Skilled nursing facility   DISCHARGE MEDICATION: Allergies as of 09/23/2022       Reactions   Short Ragweed Pollen Ext Cough        Medication List     STOP taking these medications    cephALEXin 500 MG capsule Commonly known as: KEFLEX   rivaroxaban 20 MG Tabs tablet Commonly known as: XARELTO       TAKE these medications    acetaminophen 325 MG tablet Commonly known as: TYLENOL Take 2 tablets (650 mg total) by mouth every 6 (six) hours as needed for mild pain (or Fever >/= 101).   acyclovir 400 MG tablet Commonly known as: ZOVIRAX Take 400 mg by mouth 2 (two) times daily.   albuterol 108 (90 Base) MCG/ACT inhaler Commonly known as: VENTOLIN HFA Inhale 2 puffs into the lungs every 6 (six) hours as needed for wheezing or shortness of breath.   alfuzosin 10 MG 24 hr tablet Commonly known as: UROXATRAL TAKE 1 TABLET AT BEDTIME   allopurinol 100 MG tablet Commonly known as: ZYLOPRIM Take 1 tablet (100 mg total) by mouth 2 (two) times daily.   allopurinol 300 MG tablet Commonly known as: ZYLOPRIM Take 300 mg by mouth daily.   ascorbic acid 500 MG tablet Commonly  known as: VITAMIN C Take 500 mg by mouth daily with lunch.   atorvastatin 80 MG tablet Commonly known as: LIPITOR TAKE 1 TABLET EVERY DAY What changed: when to take this   buPROPion 300 MG 24 hr tablet Commonly known as: WELLBUTRIN XL TAKE 1 TABLET EVERY DAY WITH BREAKFAST What changed: See the new instructions.   CALCIUM CITRATE + D PO Take 1 tablet by mouth 2 (two) times daily. '1200mg'$  of Calcium and 1000 units vitamin D3   clonazePAM 1 MG  tablet Commonly known as: KLONOPIN Take 1 tablet (1 mg total) by mouth 3 (three) times daily as needed for anxiety. What changed: when to take this   CRANBERRY PO Take 1 tablet by mouth in the morning and at bedtime.   denosumab 60 MG/ML Sosy injection Commonly known as: PROLIA Inject 60 mg as directed every 6 (six) months.   DULoxetine 60 MG capsule Commonly known as: CYMBALTA TAKE 1 CAPSULE TWICE DAILY   EPINEPHrine 0.3 mg/0.3 mL Soaj injection Commonly known as: EPI-PEN Inject 0.3 mg into the muscle once as needed for anaphylaxis (severe allergic reaction).   ezetimibe 10 MG tablet Commonly known as: ZETIA Take 10 mg by mouth at bedtime.   feeding supplement Liqd Take 237 mLs by mouth 2 (two) times daily between meals. What changed: when to take this   ferrous sulfate 325 (65 FE) MG tablet Take 325 mg by mouth 2 (two) times daily with a meal.   levothyroxine 125 MCG tablet Commonly known as: SYNTHROID Take 1 tablet (125 mcg total) by mouth daily at 6 (six) AM.   lidocaine-prilocaine cream Commonly known as: EMLA Apply 1 Application topically as needed. What changed: reasons to take this   loratadine 10 MG tablet Commonly known as: CLARITIN Take 10 mg by mouth daily.   Magnesium 250 MG Tabs Take 250 mg by mouth daily.   metoprolol tartrate 25 MG tablet Commonly known as: LOPRESSOR Take 25 mg by mouth 2 (two) times daily.   multivitamin with minerals Tabs tablet Take 1 tablet by mouth every morning. Men's One-A-Day 50+   Myrbetriq 25 MG Tb24 tablet Generic drug: mirabegron ER TAKE 1 TABLET EVERY DAY What changed:  how much to take when to take this   nitroGLYCERIN 0.4 MG SL tablet Commonly known as: NITROSTAT Place 0.4 mg under the tongue every 5 (five) minutes as needed for chest pain.   omeprazole 40 MG capsule Commonly known as: PRILOSEC TAKE 1 CAPSULE EVERY DAY BEFORE BREAKFAST What changed: See the new instructions.   Potassium Citrate 15  MEQ (1620 MG) Tbcr Take 1 tablet by mouth See admin instructions. Take one tablet (15 meq) by mouth twice daily - with lunch and supper What changed:  when to take this additional instructions   PROBIOTIC PO Take 1 capsule by mouth daily with breakfast.   promethazine 25 MG tablet Commonly known as: PHENERGAN Take 25 mg by mouth every 6 (six) hours as needed for nausea or vomiting.   SKYRIZI (150 MG DOSE) [place] Inject 150 mg into the skin every 4 (four) months.   testosterone enanthate 200 MG/ML injection Commonly known as: DELATESTRYL INJECT 1ML ('200MG'$ ) INTO THE MUSCLE EVERY 14 DAYS. DISCARD VIAL AFTER 28 DAYS. What changed:  how much to take how to take this when to take this additional instructions   Vitamin D 50 MCG (2000 UT) tablet Take 4,000 Units by mouth in the morning and at bedtime.  Discharge Care Instructions  (From admission, onward)           Start     Ordered   09/21/22 0000  Discharge wound care:       Comments: Cleanse around the wound with sterile saline and pat dry Apply zinc barrier cream around the wound Pack the wound with silver hydrofiber (Aquacel Ag+) and top with a silicone foam dressing Change daily or for soiling   09/21/22 1046            Contact information for follow-up providers     Plotnikov, Evie Lacks, MD. Schedule an appointment as soon as possible for a visit.   Specialty: Internal Medicine Why: Please schedule an appointment with your PCP in the next week. Contact information: [place] [address] [phone]         Festus Aloe, MD. Schedule an appointment as soon as possible for a visit in 1 week(s).   Specialty: Urology Contact information: [place] [address] [phone]              Contact information for after-discharge care     Destination     [facility] Preferred SNF .   Service: Skilled Nursing Contact  information: [place] [phone]                     Discharge Instructions     Discharge instructions   Complete by: As directed    **IMPORTANT DISCHARGE INSTRUCTIONS**   From Dr. Loleta Books: You were admitted for kidney injury  Here, you were given fluids and this got better  There was some initial concern for infection, but you were evaluated by Infectious Disease Dr. Tommy Medal and he felt that no antibiotics were helpful, and that the organism that grew in your urine culture was not a pathogen (just a contaminant)  We recommend you resume all your home medicines for now, without change EXCEPT rivaroxaban/Xarelto  Do NOT take Xarelto for now  Go see Dr. Junious Silk as soon as you can  Return to the hospital for severe bleeding, inability to pee, or passing out/falling  Go see your primary doctor when you are able  Continue follow up with your Oncologist   Discharge wound care:   Complete by: As directed    Cleanse around the wound with sterile saline and pat dry Apply zinc barrier cream around the wound Pack the wound with silver hydrofiber (Aquacel Ag+) and top with a silicone foam dressing Change daily or for soiling   Increase activity slowly   Complete by: As directed        Discharge Exam: Filed Weights   09/18/22 0500 09/19/22 0455 09/20/22 0451  Weight: 108 kg 108 kg 107.8 kg    General: Pt is alert, awake, not in acute distress Cardiovascular: RRR, nl S1-S2, no murmurs appreciated.   No LE edema.   Respiratory: Normal respiratory rate and rhythm.  CTAB without rales or wheezes. Abdominal: Abdomen soft and non-tender.  No distension or HSM.   Neuro/Psych: Strength symmetric in upper and lower extremities.  Judgment and insight appear normal.   Condition at discharge: stable  The results of significant diagnostics from this hospitalization (including imaging, microbiology, ancillary and laboratory) are  listed below for reference.   Imaging Studies: US RENAL  Result Date: 09/16/2022 CLINICAL DATA:  Acute kidney injury.  Gross hematuria. EXAM: RENAL / URINARY TRACT ULTRASOUND COMPLETE  COMPARISON:  CT scan 09/05/2022. FINDINGS: Right Kidney: Renal measurements: 9.7 x 6.1 x 5.8 cm = volume: 182 mL. Cortical thinning with increased echogenicity of parenchyma. No hydronephrosis. Left Kidney: Renal measurements: 10.7 x 6.1 x 8.3 cm = volume: 285 mL. Cortical thinning with increased echogenicity. 2.3 cm posterior interpolar simple cyst identified as noted on previous CT. Bladder: Right-sided bladder wall thickening, similar to prior CT. Other: None. IMPRESSION: 1. Bilateral renal cortical thinning with increased echogenicity of renal parenchyma compatible with chronic medical renal disease. 2. No hydronephrosis. 3. Right-sided bladder wall thickening, similar to prior CT. Electronically Signed   By: Misty Stanley M.D.   On: 09/16/2022 06:02   DG Chest Port 1 View  Result Date: 09/16/2022 CLINICAL DATA:  Weakness EXAM: PORTABLE CHEST 1 VIEW COMPARISON:  09/05/2022 FINDINGS: Right Port-A-Cath remains in place, unchanged. Heart and mediastinal contours are within normal limits. No focal opacities or effusions. No acute bony abnormality. IMPRESSION: No active cardiopulmonary disease. Electronically Signed   By: Rolm Baptise M.D.   On: 09/16/2022 00:53   ECHOCARDIOGRAM COMPLETE  Result Date: 09/06/2022    ECHOCARDIOGRAM REPORT   Patient Name:   JAYDIAN SANTANA Date of Exam: 09/06/2022 Medical Rec #:  [id number]        Height:       76.0 in Accession #:    [id number]       Weight:       232.0 lb Date of Birth:  03/11/1946        BSA:          2.359 m Patient Age:    15 years         BP:           105/65 mmHg Patient Gender: M                HR:           90 bpm. Exam Location:  Inpatient Procedure: 2D Echo, Cardiac Doppler and Color Doppler Indications:    R55 Syncope  History:        Patient has prior history of  Echocardiogram examinations, most                 recent 06/28/2019. CHF, CAD, Arrythmias:Atrial Fibrillation,                 Signs/Symptoms:Syncope; Risk Factors:Hypertension, Dyslipidemia                 and GERD.  Sonographer:    Bernadene Person RDCS Referring Phys: [place]  Sonographer Comments: No parasternal window. IMPRESSIONS  1. Left ventricular ejection fraction, by estimation, is 35 to 40%. The left ventricle has moderately decreased function. The left ventricle demonstrates global hypokinesis. Left ventricular diastolic parameters are indeterminate.  2. Right ventricular systolic function is mildly reduced. The right ventricular size is normal. Tricuspid regurgitation signal is inadequate for assessing PA pressure.  3. Left atrial size was mildly dilated.  4. Right atrial size was mildly dilated.  5. The mitral valve is normal in structure. No evidence of mitral valve regurgitation. No evidence of mitral stenosis.  6. The aortic valve was not well visualized. There is mild calcification of the aortic valve. Aortic valve regurgitation is not visualized. No aortic stenosis is present.  7. The inferior vena cava is dilated in size with >50% respiratory variability, suggesting right atrial pressure of 8 mmHg.  8. The patient was in atrial fibrillation. FINDINGS  Left Ventricle:  Left ventricular ejection fraction, by estimation, is 35 to 40%. The left ventricle has moderately decreased function. The left ventricle demonstrates global hypokinesis. The left ventricular internal cavity size was normal in size. There is no left ventricular hypertrophy. Left ventricular diastolic parameters are indeterminate. Right Ventricle: The right ventricular size is normal. No increase in right ventricular wall thickness. Right ventricular systolic function is mildly reduced. Tricuspid regurgitation signal is inadequate for assessing PA pressure. The tricuspid regurgitant velocity is 1.39 m/s, and with an  assumed right atrial pressure of 8 mmHg, the estimated right ventricular systolic pressure is 71.6 mmHg. Left Atrium: Left atrial size was mildly dilated. Right Atrium: Right atrial size was mildly dilated. Pericardium: There is no evidence of pericardial effusion. Mitral Valve: The mitral valve is normal in structure. No evidence of mitral valve regurgitation. No evidence of mitral valve stenosis. Tricuspid Valve: The tricuspid valve is normal in structure. Tricuspid valve regurgitation is trivial. Aortic Valve: The aortic valve was not well visualized. There is mild calcification of the aortic valve. Aortic valve regurgitation is not visualized. No aortic stenosis is present. Pulmonic Valve: The pulmonic valve was not well visualized. Pulmonic valve regurgitation is trivial. Aorta: The aortic root is normal in size and structure. Venous: The inferior vena cava is dilated in size with greater than 50% respiratory variability, suggesting right atrial pressure of 8 mmHg. IAS/Shunts: No atrial level shunt detected by color flow Doppler.  LEFT VENTRICLE PLAX 2D LVIDd:         4.90 cm LVIDs:         3.60 cm LV PW:         0.80 cm LV IVS:        0.80 cm LVOT diam:     2.00 cm LV SV:         46 LV SV Index:   19 LVOT Area:     3.14 cm  LV Volumes (MOD) LV vol d, MOD A2C: 86.4 ml LV vol d, MOD A4C: 87.4 ml LV vol s, MOD A2C: 35.4 ml LV vol s, MOD A4C: 38.8 ml LV SV MOD A2C:     51.0 ml LV SV MOD A4C:     87.4 ml LV SV MOD BP:      48.9 ml RIGHT VENTRICLE TAPSE (M-mode): 1.9 cm LEFT ATRIUM             Index        RIGHT ATRIUM           Index LA diam:        4.30 cm 1.82 cm/m   RA Area:     26.20 cm LA Vol (A2C):   68.5 ml 29.03 ml/m  RA Volume:   79.40 ml  33.65 ml/m LA Vol (A4C):   75.2 ml 31.87 ml/m LA Biplane Vol: 75.9 ml 32.17 ml/m  AORTIC VALVE LVOT Vmax:   92.37 cm/s LVOT Vmean:  60.400 cm/s LVOT VTI:    0.146 m  AORTA Ao Root diam: 3.30 cm TRICUSPID VALVE TR Peak grad:   7.7 mmHg TR Vmax:        139.00 cm/s   SHUNTS Systemic VTI:  0.15 m Systemic Diam: 2.00 cm Dalton McleanMD Electronically signed by Franki Monte Signature Date/Time: 09/06/2022/2:21:19 PM    Final    CT Renal Stone Study  Result Date: 09/05/2022 CLINICAL DATA:  History of UTI 1 month ago with persistent weakness. EXAM: CT ABDOMEN AND PELVIS WITHOUT CONTRAST TECHNIQUE: Multidetector CT imaging  of the abdomen and pelvis was performed following the standard protocol without IV contrast. RADIATION DOSE REDUCTION: This exam was performed according to the departmental dose-optimization program which includes automated exposure control, adjustment of the mA and/or kV according to patient size and/or use of iterative reconstruction technique. COMPARISON:  July 21, 2022 FINDINGS: Lower chest: Mild linear scarring and/or atelectasis is seen within the right lung base. Hepatobiliary: No focal liver abnormality is seen. No gallstones, gallbladder wall thickening, or biliary dilatation. Pancreas: Unremarkable. No pancreatic ductal dilatation or surrounding inflammatory changes. Spleen: Normal in size without focal abnormality. Adrenals/Urinary Tract: A stable 2.0 cm low-attenuation right adrenal adenoma is noted (approximately 4.44 Hounsfield units). The left adrenal gland is unremarkable. Kidneys are normal in size, without obstructing renal calculi or hydronephrosis. A 3 mm nonobstructing renal calculus is seen within the mid to lower left kidney. A stable 2.2 cm cyst is seen within the posterior aspect of the mid left kidney. The urinary bladder is poorly distended and subsequently limited in evaluation. Bladder is also limited in evaluation secondary to overlying streak artifact. Mild to moderate severity asymmetric lateral urinary bladder wall thickening is seen, right greater than left. A 2.4 cm x 1.3 cm anterior urinary bladder diverticulum is also seen on the right. Stomach/Bowel: Stomach is within normal limits. The appendix is surgically absent.  No evidence of bowel wall thickening, distention, or inflammatory changes. Vascular/Lymphatic: Aortic atherosclerosis. No enlarged abdominal or pelvic lymph nodes. Reproductive: Prostate is unremarkable. Other: A 3.8 cm x 3.5 cm fat containing umbilical hernia is noted. No abdominopelvic ascites. Musculoskeletal: A severe, chronic compression fracture deformity is seen at the level of L1 with stable, moderate severity retropulsion and kyphosis. IMPRESSION: 1. 3 mm nonobstructing left renal calculus. 2. Stable right adrenal adenoma. No follow-up imaging is recommended. This recommendation follows ACR consensus guidelines: Management of Incidental Adrenal Masses: A White Paper of the ACR Incidental Findings Committee. J Am Coll Radiol 2017;14:1038-1044. 3. Stable left renal cyst. No follow-up imaging is recommended. This recommendation follows ACR consensus guidelines: Management of the Incidental Renal Mass on CT: A White Paper of the ACR Incidental Findings Committee. J Am Coll Radiol [id number]. 4. Stable urinary bladder diverticulum with asymmetric, lateral urinary bladder wall thickening, as described above. While this may represent sequelae associated with cystitis, sequelae associated with an underlying neoplasm cannot be excluded. Urology consult and follow-up cystoscopy is recommended. 5. Stable severe, chronic compression fracture deformity at the level of L1 with stable, moderate severity retropulsion and kyphosis. 6. Total right hip replacement with associated streak artifact. 7. Aortic atherosclerosis. Aortic Atherosclerosis (ICD10-I70.0). Electronically Signed   By: Virgina Norfolk M.D.   On: 09/05/2022 20:36   DG Chest Port 1 View  Result Date: 09/05/2022 CLINICAL DATA:  Possible sepsis. EXAM: PORTABLE CHEST 1 VIEW COMPARISON:  07/20/2022 and prior radiographs FINDINGS: RIGHT IJ Port-A-Cath again noted with tip overlying the mid SVC. The cardiomediastinal silhouette is unremarkable. There is  no evidence of focal airspace disease, pulmonary edema, suspicious pulmonary nodule/mass, pleural effusion, or pneumothorax. No acute bony abnormalities are identified. IMPRESSION: No active disease. Electronically Signed   By: Margarette Canada M.D.   On: 09/05/2022 18:01   CT Head Wo Contrast  Result Date: 09/05/2022 CLINICAL DATA:  76 year old male with fall and head injury. EXAM: CT HEAD WITHOUT CONTRAST TECHNIQUE: Contiguous axial images were obtained from the base of the skull through the vertex without intravenous contrast. RADIATION DOSE REDUCTION: This exam was performed according to the departmental dose-optimization  program which includes automated exposure control, adjustment of the mA and/or kV according to patient size and/or use of iterative reconstruction technique. COMPARISON:  07/20/2022 head CT and prior studies FINDINGS: Brain: No evidence of acute infarction, hemorrhage, hydrocephalus, extra-axial collection or mass lesion/mass effect. Atrophy and chronic small-vessel white matter ischemic changes again noted. Vascular: Carotid and vertebral atherosclerotic calcifications are noted. Skull: Normal. Negative for fracture or focal lesion. Sinuses/Orbits: RIGHT mastoid effusion is noted. The middle ears and inner ears are clear. The paranasal sinuses and LEFT mastoid air cells are clear. Other: None IMPRESSION: 1. No evidence of acute intracranial abnormality. 2. Atrophy and chronic small-vessel white matter ischemic changes. 3. RIGHT mastoid effusion. Electronically Signed   By: Margarette Canada M.D.   On: 09/05/2022 17:49    Microbiology: Results for orders placed or performed during the hospital encounter of 09/15/22  Urine Culture     Status: Abnormal   Collection Time: 09/16/22  3:35 AM   Specimen: Urine, Clean Catch  Result Value Ref Range Status   Specimen Description URINE, CLEAN CATCH  Final   Special Requests NONE  Final   Culture (A)  Final    >=100,000 COLONIES/mL  DIPHTHEROIDS(CORYNEBACTERIUM SPECIES) Standardized susceptibility testing for this organism is not available. Performed at [facility], [place] [address], [place], [place] [address]    Report Status 09/17/2022 FINAL  Final  Culture, blood (Routine X 2) w Reflex to ID Panel     Status: None   Collection Time: 09/16/22  7:16 AM   Specimen: BLOOD  Result Value Ref Range Status   Specimen Description BLOOD BLOOD RIGHT HAND  Final   Special Requests   Final    BOTTLES DRAWN AEROBIC AND ANAEROBIC Blood Culture results may not be optimal due to an inadequate volume of blood received in culture bottles   Culture   Final    NO GROWTH 5 DAYS Performed at [facility], [place] [address] [place], [place] [address]    Report Status 09/21/2022 FINAL  Final   *Note: Due to a large number of results and/or encounters for the requested time period, some results have not been displayed. A complete set of results can be found in Results Review.    Labs: CBC: Recent Labs  Lab 09/17/22 0749 09/18/22 0336 09/19/22 0329 09/20/22 0441  WBC 6.6 7.0 7.0 6.6  HGB 12.2* 11.5* 12.2* 12.0*  HCT 36.1* 35.4* 37.7* 37.4*  MCV 92.6 93.9 95.0 94.9  PLT 134* 126* 122* 539*   Basic Metabolic Panel: Recent Labs  Lab 09/17/22 0749 09/18/22 0336 09/19/22 0329  NA 134* 135 137  K 4.1 4.1 4.2  CL 102 106 105  CO2 23 22 21*  GLUCOSE 82 114* 89  BUN 32* 36* 34*  CREATININE 1.62* 1.47* 1.24  CALCIUM 7.9* 7.8* 7.7*   Liver Function Tests: No results for input(s): "AST", "ALT", "ALKPHOS", "BILITOT", "PROT", "ALBUMIN" in the last 168 hours. CBG: No results for input(s): "GLUCAP" in the last 168 hours.  Discharge time spent: approximately 35 minutes spent on discharge counseling, evaluation of patient on day of discharge, and coordination of discharge planning with nursing, social work, pharmacy and case management  Signed: Edwin Dada, MD [facility] 09/23/2022

## 2022-09-23 NOTE — Progress Notes (Signed)
Occupational Therapy Treatment Patient Details Name: [name] MRN: [id number] DOB: Mar 03, 1946 Today's Date: 09/23/2022   History of present illness Pt is a 76 y/o male who presented with weakness and falls at home with dysuria and gross hematuria. 2 recent admissions. PMH: a fib, BPH; CAD; chronic diastolic CHF; B cell lymphoma; HTN; HLD; OSA on CPAP; plaque psoriasis; and hypothyroidism   OT comments  Patient received in supine and declined any self care task stating he had already completed self care this am. Patient agreeable to address transfers and standing balance. Patient demonstrating gains with bed mobility and transfers with min guard assist from min assist. Patient able to perform reaching tasks while standing to simulate standing ADLs with min guard assist. Patient discharge recommendations changed to SNF from [facility] due to patient expected to go to SNF for continued rehab.    Recommendations for follow up therapy are one component of a multi-disciplinary discharge planning process, led by the attending physician.  Recommendations may be updated based on patient status, additional functional criteria and insurance authorization.    Follow Up Recommendations  Skilled nursing-short term rehab (<3 hours/day)     Assistance Recommended at Discharge Intermittent Supervision/Assistance  Patient can return home with the following  A little help with walking and/or transfers;A little help with bathing/dressing/bathroom;Assistance with cooking/housework;Assist for transportation;Help with stairs or ramp for entrance   Equipment Recommendations  None recommended by OT    Recommendations for Other Services      Precautions / Restrictions Precautions Precautions: Fall Restrictions Weight Bearing Restrictions: No       Mobility Bed Mobility Overal bed mobility: Needs Assistance Bed Mobility: Supine to Sit     Supine to sit: HOB elevated, Supervision     General bed  mobility comments: supervision for safety and use of bed rails    Transfers Overall transfer level: Needs assistance Equipment used: Rolling walker (2 wheels) Transfers: Sit to/from Stand, Bed to chair/wheelchair/BSC Sit to Stand: Min guard     Step pivot transfers: Min guard     General transfer comment: able to stand from EOB and recliner with min guard assist     Balance Overall balance assessment: History of Falls, Needs assistance Sitting-balance support: No upper extremity supported, Feet supported Sitting balance-Leahy Scale: Good     Standing balance support: Single extremity supported, Bilateral upper extremity supported, No upper extremity supported Standing balance-Leahy Scale: Fair Standing balance comment: able to perform reaching tasks while standing with min guard assist for balance but patient fatigued quickly                           ADL either performed or assessed with clinical judgement   ADL Overall ADL's : Needs assistance/impaired                         Toilet Transfer: Min guard;Rolling walker (2 wheels) Toilet Transfer Details (indicate cue type and reason): simulated to recliner           General ADL Comments: patient declined self care tasks and asked to address standing balance and transfers    Extremity/Trunk Assessment              Vision       Perception     Praxis      Cognition Arousal/Alertness: Awake/alert Behavior During Therapy: WFL for tasks assessed/performed Overall Cognitive Status: Within Functional Limits for tasks assessed  Exercises      Shoulder Instructions       General Comments      Pertinent Vitals/ Pain       Pain Assessment Pain Assessment: Faces Faces Pain Scale: Hurts little more Pain Location: generalized with mobility Pain Descriptors / Indicators: Grimacing Pain Intervention(s): Limited activity within  patient's tolerance, Monitored during session, Repositioned  Home Living                                          Prior Functioning/Environment              Frequency  Min 2X/week        Progress Toward Goals  OT Goals(current goals can now be found in the care plan section)  Progress towards OT goals: Progressing toward goals  Acute Rehab OT Goals Patient Stated Goal: get better OT Goal Formulation: With patient Time For Goal Achievement: 09/30/22 Potential to Achieve Goals: Good ADL Goals Pt Will Perform Lower Body Bathing: with supervision;sit to/from stand;sitting/lateral leans Pt Will Perform Lower Body Dressing: with supervision;sitting/lateral leans;sit to/from stand Pt Will Transfer to Toilet: with supervision;ambulating  Plan Discharge plan remains appropriate    Co-evaluation                 AM-PAC OT "6 Clicks" Daily Activity     Outcome Measure   Help from another person eating meals?: None Help from another person taking care of personal grooming?: A Little Help from another person toileting, which includes using toliet, bedpan, or urinal?: Total Help from another person bathing (including washing, rinsing, drying)?: A Lot Help from another person to put on and taking off regular upper body clothing?: A Little Help from another person to put on and taking off regular lower body clothing?: A Lot 6 Click Score: 15    End of Session Equipment Utilized During Treatment: Rolling walker (2 wheels)  OT Visit Diagnosis: Muscle weakness (generalized) (M62.81)   Activity Tolerance Patient tolerated treatment well   Patient Left in chair;with call bell/phone within reach   Nurse Communication Mobility status        Time: 3474-2595 OT Time Calculation (min): 27 min  Charges: OT General Charges $OT Visit: 1 Visit OT Treatments $Therapeutic Activity: 23-37 mins  Lodema Hong, Oretta  Office  [phone]   Trixie Dredge 09/23/2022, 10:57 AM

## 2022-09-23 NOTE — Progress Notes (Signed)
RN attempted to call Ritta Slot for report. Unsuccessful at this time. Will attempt in a little bit. Charge nurse made aware

## 2022-09-24 DIAGNOSIS — E782 Mixed hyperlipidemia: Secondary | ICD-10-CM | POA: Diagnosis not present

## 2022-09-24 DIAGNOSIS — F32A Depression, unspecified: Secondary | ICD-10-CM | POA: Diagnosis not present

## 2022-09-24 DIAGNOSIS — E039 Hypothyroidism, unspecified: Secondary | ICD-10-CM | POA: Diagnosis not present

## 2022-09-24 DIAGNOSIS — C8510 Unspecified B-cell lymphoma, unspecified site: Secondary | ICD-10-CM | POA: Diagnosis not present

## 2022-09-24 DIAGNOSIS — D638 Anemia in other chronic diseases classified elsewhere: Secondary | ICD-10-CM | POA: Diagnosis not present

## 2022-09-24 DIAGNOSIS — I2510 Atherosclerotic heart disease of native coronary artery without angina pectoris: Secondary | ICD-10-CM | POA: Diagnosis not present

## 2022-09-24 DIAGNOSIS — I4891 Unspecified atrial fibrillation: Secondary | ICD-10-CM | POA: Diagnosis not present

## 2022-09-24 DIAGNOSIS — I10 Essential (primary) hypertension: Secondary | ICD-10-CM | POA: Diagnosis not present

## 2022-09-24 DIAGNOSIS — E291 Testicular hypofunction: Secondary | ICD-10-CM | POA: Diagnosis not present

## 2022-09-25 ENCOUNTER — Other Ambulatory Visit: Payer: Self-pay | Admitting: Cardiovascular Disease

## 2022-09-25 ENCOUNTER — Other Ambulatory Visit: Payer: Self-pay | Admitting: Infectious Disease

## 2022-09-25 DIAGNOSIS — C8330 Diffuse large B-cell lymphoma, unspecified site: Secondary | ICD-10-CM

## 2022-09-25 DIAGNOSIS — Z7189 Other specified counseling: Secondary | ICD-10-CM

## 2022-09-27 DIAGNOSIS — N1831 Chronic kidney disease, stage 3a: Secondary | ICD-10-CM | POA: Diagnosis not present

## 2022-09-27 DIAGNOSIS — F411 Generalized anxiety disorder: Secondary | ICD-10-CM | POA: Diagnosis not present

## 2022-09-27 DIAGNOSIS — I5032 Chronic diastolic (congestive) heart failure: Secondary | ICD-10-CM | POA: Diagnosis not present

## 2022-09-27 DIAGNOSIS — J449 Chronic obstructive pulmonary disease, unspecified: Secondary | ICD-10-CM | POA: Diagnosis not present

## 2022-09-27 DIAGNOSIS — F331 Major depressive disorder, recurrent, moderate: Secondary | ICD-10-CM | POA: Diagnosis not present

## 2022-09-27 DIAGNOSIS — F410 Panic disorder [episodic paroxysmal anxiety] without agoraphobia: Secondary | ICD-10-CM | POA: Diagnosis not present

## 2022-09-27 DIAGNOSIS — E039 Hypothyroidism, unspecified: Secondary | ICD-10-CM | POA: Diagnosis not present

## 2022-09-27 DIAGNOSIS — I4891 Unspecified atrial fibrillation: Secondary | ICD-10-CM | POA: Diagnosis not present

## 2022-09-27 NOTE — Telephone Encounter (Signed)
Rx request sent to pharmacy.  

## 2022-09-28 DIAGNOSIS — N3021 Other chronic cystitis with hematuria: Secondary | ICD-10-CM | POA: Diagnosis not present

## 2022-09-29 DIAGNOSIS — N179 Acute kidney failure, unspecified: Secondary | ICD-10-CM | POA: Diagnosis not present

## 2022-09-29 DIAGNOSIS — C8330 Diffuse large B-cell lymphoma, unspecified site: Secondary | ICD-10-CM | POA: Diagnosis not present

## 2022-09-29 DIAGNOSIS — R4182 Altered mental status, unspecified: Secondary | ICD-10-CM | POA: Diagnosis not present

## 2022-09-29 DIAGNOSIS — C8338 Diffuse large B-cell lymphoma, lymph nodes of multiple sites: Secondary | ICD-10-CM | POA: Diagnosis not present

## 2022-09-29 DIAGNOSIS — R000 Tachycardia, unspecified: Secondary | ICD-10-CM | POA: Diagnosis not present

## 2022-09-29 DIAGNOSIS — N390 Urinary tract infection, site not specified: Secondary | ICD-10-CM | POA: Diagnosis not present

## 2022-09-29 DIAGNOSIS — Z87440 Personal history of urinary (tract) infections: Secondary | ICD-10-CM | POA: Diagnosis not present

## 2022-09-29 DIAGNOSIS — R319 Hematuria, unspecified: Secondary | ICD-10-CM | POA: Diagnosis not present

## 2022-09-29 DIAGNOSIS — Z792 Long term (current) use of antibiotics: Secondary | ICD-10-CM | POA: Diagnosis not present

## 2022-09-29 DIAGNOSIS — D649 Anemia, unspecified: Secondary | ICD-10-CM | POA: Diagnosis not present

## 2022-09-29 NOTE — Progress Notes (Signed)
Concern for possible infection

## 2022-09-30 ENCOUNTER — Telehealth: Payer: Self-pay | Admitting: *Deleted

## 2022-09-30 ENCOUNTER — Ambulatory Visit: Payer: Medicare PPO | Admitting: Infectious Disease

## 2022-09-30 ENCOUNTER — Encounter: Payer: Self-pay | Admitting: *Deleted

## 2022-09-30 DIAGNOSIS — D649 Anemia, unspecified: Secondary | ICD-10-CM | POA: Diagnosis not present

## 2022-09-30 DIAGNOSIS — R319 Hematuria, unspecified: Secondary | ICD-10-CM | POA: Diagnosis not present

## 2022-09-30 DIAGNOSIS — R4182 Altered mental status, unspecified: Secondary | ICD-10-CM | POA: Diagnosis not present

## 2022-09-30 DIAGNOSIS — Z792 Long term (current) use of antibiotics: Secondary | ICD-10-CM | POA: Diagnosis not present

## 2022-09-30 DIAGNOSIS — N179 Acute kidney failure, unspecified: Secondary | ICD-10-CM | POA: Diagnosis not present

## 2022-09-30 DIAGNOSIS — R000 Tachycardia, unspecified: Secondary | ICD-10-CM | POA: Diagnosis not present

## 2022-09-30 DIAGNOSIS — Z87440 Personal history of urinary (tract) infections: Secondary | ICD-10-CM | POA: Diagnosis not present

## 2022-09-30 DIAGNOSIS — C8338 Diffuse large B-cell lymphoma, lymph nodes of multiple sites: Secondary | ICD-10-CM | POA: Diagnosis not present

## 2022-09-30 DIAGNOSIS — N390 Urinary tract infection, site not specified: Secondary | ICD-10-CM | POA: Diagnosis not present

## 2022-09-30 NOTE — Patient Outreach (Signed)
  Care Coordination   Initial Visit Note   09/30/2022 Name: [name] MRN: [id number] DOB: [birth year]  [name] is a 76 y.o. year old male who sees Plotnikov, Evie Lacks, MD for primary care. I spoke with spouse/ caregiver [name], on   [name] by phone today in response to notification that patient had been discharged home from hospital and needed [facility] call; spouse verified patient remains in hospital; care coordination outreach placed accordingly.  What matters to the patients health and wellness today?  Per caregiver/ spouse [name], on [facility] DPR: "he has not been discharged home yet.... when I went up to visit him at [facility], I was also told he had been discharged-- but I questioned them, and then they said, "oh, he IS still here." He is doing okay and getting better I think, but they have already told me that he will return to [facility] again this time for ongoing therapy-- he was at [facility] when he ran into trouble and had to go back in the hospital.  I don't know what will happen in the long term, he may be ready to transition to a facility permanently, but I don't know, and I know he will not like that.  I guess we'll just see what happens"  Care Coordination activities/ interventions completed, including: -- discussed options for long-term care vs in-home care assistance once patient discharge is completed -- provided education around safety considerations with spouse reassuming 24-7 care at home; spouse again confirms he is limited in his ability to provide total care at home -- coached spouse around resources available for him at SNF/ rehabilitation facility and encouraged spouse to utilize these resources around his concerns in caring for patient if he were to return home -- reinforced previously provided education around palliative care services -- re-provided my direct phone number to patient's spouse should he have ongoing questions/ concerns in the  future  SDOH assessments and interventions completed:  No Not indicated--- patient remains currently hospitalized with discharge planning to return to SNF/ Rehabilitation facility [facility]   Care Coordination Interventions:  Yes, provided   Follow up plan: No further intervention required.   Encounter Outcome:  Pt. Visit Completed   Oneta Rack, RN, BSN, CCRN Alumnus RN CM Care Coordination/ Transition of [place] Management [phone]: direct office

## 2022-10-01 ENCOUNTER — Encounter: Payer: Self-pay | Admitting: Internal Medicine

## 2022-10-01 DIAGNOSIS — D649 Anemia, unspecified: Secondary | ICD-10-CM | POA: Diagnosis not present

## 2022-10-01 DIAGNOSIS — Z87440 Personal history of urinary (tract) infections: Secondary | ICD-10-CM | POA: Diagnosis not present

## 2022-10-01 DIAGNOSIS — R000 Tachycardia, unspecified: Secondary | ICD-10-CM | POA: Diagnosis not present

## 2022-10-01 DIAGNOSIS — R4182 Altered mental status, unspecified: Secondary | ICD-10-CM | POA: Diagnosis not present

## 2022-10-01 DIAGNOSIS — N390 Urinary tract infection, site not specified: Secondary | ICD-10-CM | POA: Diagnosis not present

## 2022-10-01 DIAGNOSIS — C8338 Diffuse large B-cell lymphoma, lymph nodes of multiple sites: Secondary | ICD-10-CM | POA: Diagnosis not present

## 2022-10-01 DIAGNOSIS — N179 Acute kidney failure, unspecified: Secondary | ICD-10-CM | POA: Diagnosis not present

## 2022-10-01 DIAGNOSIS — Z792 Long term (current) use of antibiotics: Secondary | ICD-10-CM | POA: Diagnosis not present

## 2022-10-01 DIAGNOSIS — R319 Hematuria, unspecified: Secondary | ICD-10-CM | POA: Diagnosis not present

## 2022-10-02 DIAGNOSIS — C8338 Diffuse large B-cell lymphoma, lymph nodes of multiple sites: Secondary | ICD-10-CM | POA: Diagnosis not present

## 2022-10-02 DIAGNOSIS — D649 Anemia, unspecified: Secondary | ICD-10-CM | POA: Diagnosis not present

## 2022-10-02 DIAGNOSIS — Z792 Long term (current) use of antibiotics: Secondary | ICD-10-CM | POA: Diagnosis not present

## 2022-10-02 DIAGNOSIS — R319 Hematuria, unspecified: Secondary | ICD-10-CM | POA: Diagnosis not present

## 2022-10-02 DIAGNOSIS — N390 Urinary tract infection, site not specified: Secondary | ICD-10-CM | POA: Diagnosis not present

## 2022-10-02 DIAGNOSIS — R000 Tachycardia, unspecified: Secondary | ICD-10-CM | POA: Diagnosis not present

## 2022-10-02 DIAGNOSIS — R4182 Altered mental status, unspecified: Secondary | ICD-10-CM | POA: Diagnosis not present

## 2022-10-02 DIAGNOSIS — N179 Acute kidney failure, unspecified: Secondary | ICD-10-CM | POA: Diagnosis not present

## 2022-10-02 DIAGNOSIS — Z87440 Personal history of urinary (tract) infections: Secondary | ICD-10-CM | POA: Diagnosis not present

## 2022-10-05 DIAGNOSIS — C8510 Unspecified B-cell lymphoma, unspecified site: Secondary | ICD-10-CM | POA: Diagnosis not present

## 2022-10-05 DIAGNOSIS — J449 Chronic obstructive pulmonary disease, unspecified: Secondary | ICD-10-CM | POA: Diagnosis not present

## 2022-10-05 DIAGNOSIS — I10 Essential (primary) hypertension: Secondary | ICD-10-CM | POA: Diagnosis not present

## 2022-10-05 DIAGNOSIS — I2510 Atherosclerotic heart disease of native coronary artery without angina pectoris: Secondary | ICD-10-CM | POA: Diagnosis not present

## 2022-10-05 DIAGNOSIS — I4891 Unspecified atrial fibrillation: Secondary | ICD-10-CM | POA: Diagnosis not present

## 2022-10-05 DIAGNOSIS — I5032 Chronic diastolic (congestive) heart failure: Secondary | ICD-10-CM | POA: Diagnosis not present

## 2022-10-05 DIAGNOSIS — D638 Anemia in other chronic diseases classified elsewhere: Secondary | ICD-10-CM | POA: Diagnosis not present

## 2022-10-05 DIAGNOSIS — F32A Depression, unspecified: Secondary | ICD-10-CM | POA: Diagnosis not present

## 2022-10-05 DIAGNOSIS — E039 Hypothyroidism, unspecified: Secondary | ICD-10-CM | POA: Diagnosis not present

## 2022-10-05 NOTE — Telephone Encounter (Signed)
Patient called to check on this note on the 10/05/2022

## 2022-10-06 DIAGNOSIS — Z5111 Encounter for antineoplastic chemotherapy: Secondary | ICD-10-CM | POA: Diagnosis not present

## 2022-10-06 DIAGNOSIS — C8338 Diffuse large B-cell lymphoma, lymph nodes of multiple sites: Secondary | ICD-10-CM | POA: Diagnosis not present

## 2022-10-06 DIAGNOSIS — C8330 Diffuse large B-cell lymphoma, unspecified site: Secondary | ICD-10-CM | POA: Diagnosis not present

## 2022-10-07 ENCOUNTER — Telehealth: Payer: Self-pay | Admitting: *Deleted

## 2022-10-07 DIAGNOSIS — L98419 Non-pressure chronic ulcer of buttock with unspecified severity: Secondary | ICD-10-CM | POA: Diagnosis not present

## 2022-10-07 DIAGNOSIS — Z9181 History of falling: Secondary | ICD-10-CM

## 2022-10-07 DIAGNOSIS — R310 Gross hematuria: Secondary | ICD-10-CM | POA: Diagnosis not present

## 2022-10-07 DIAGNOSIS — I5032 Chronic diastolic (congestive) heart failure: Secondary | ICD-10-CM | POA: Diagnosis not present

## 2022-10-07 DIAGNOSIS — G4733 Obstructive sleep apnea (adult) (pediatric): Secondary | ICD-10-CM

## 2022-10-07 DIAGNOSIS — R3915 Urgency of urination: Secondary | ICD-10-CM

## 2022-10-07 DIAGNOSIS — Z87442 Personal history of urinary calculi: Secondary | ICD-10-CM

## 2022-10-07 DIAGNOSIS — Z8781 Personal history of (healed) traumatic fracture: Secondary | ICD-10-CM

## 2022-10-07 DIAGNOSIS — I130 Hypertensive heart and chronic kidney disease with heart failure and stage 1 through stage 4 chronic kidney disease, or unspecified chronic kidney disease: Secondary | ICD-10-CM | POA: Diagnosis not present

## 2022-10-07 DIAGNOSIS — E43 Unspecified severe protein-calorie malnutrition: Secondary | ICD-10-CM

## 2022-10-07 DIAGNOSIS — L400 Psoriasis vulgaris: Secondary | ICD-10-CM

## 2022-10-07 DIAGNOSIS — I509 Heart failure, unspecified: Secondary | ICD-10-CM | POA: Diagnosis not present

## 2022-10-07 DIAGNOSIS — Z7901 Long term (current) use of anticoagulants: Secondary | ICD-10-CM

## 2022-10-07 DIAGNOSIS — N1831 Chronic kidney disease, stage 3a: Secondary | ICD-10-CM | POA: Diagnosis not present

## 2022-10-07 DIAGNOSIS — Z8619 Personal history of other infectious and parasitic diseases: Secondary | ICD-10-CM

## 2022-10-07 DIAGNOSIS — I700 Atherosclerosis of aorta: Secondary | ICD-10-CM

## 2022-10-07 DIAGNOSIS — D630 Anemia in neoplastic disease: Secondary | ICD-10-CM | POA: Diagnosis not present

## 2022-10-07 DIAGNOSIS — Z96641 Presence of right artificial hip joint: Secondary | ICD-10-CM

## 2022-10-07 DIAGNOSIS — Z95828 Presence of other vascular implants and grafts: Secondary | ICD-10-CM

## 2022-10-07 DIAGNOSIS — I480 Paroxysmal atrial fibrillation: Secondary | ICD-10-CM | POA: Diagnosis not present

## 2022-10-07 DIAGNOSIS — R3914 Feeling of incomplete bladder emptying: Secondary | ICD-10-CM | POA: Diagnosis not present

## 2022-10-07 DIAGNOSIS — D696 Thrombocytopenia, unspecified: Secondary | ICD-10-CM

## 2022-10-07 DIAGNOSIS — F411 Generalized anxiety disorder: Secondary | ICD-10-CM

## 2022-10-07 DIAGNOSIS — Z87891 Personal history of nicotine dependence: Secondary | ICD-10-CM

## 2022-10-07 DIAGNOSIS — K219 Gastro-esophageal reflux disease without esophagitis: Secondary | ICD-10-CM

## 2022-10-07 DIAGNOSIS — D509 Iron deficiency anemia, unspecified: Secondary | ICD-10-CM

## 2022-10-07 DIAGNOSIS — E890 Postprocedural hypothyroidism: Secondary | ICD-10-CM

## 2022-10-07 DIAGNOSIS — N401 Enlarged prostate with lower urinary tract symptoms: Secondary | ICD-10-CM

## 2022-10-07 DIAGNOSIS — F410 Panic disorder [episodic paroxysmal anxiety] without agoraphobia: Secondary | ICD-10-CM

## 2022-10-07 DIAGNOSIS — Z87440 Personal history of urinary (tract) infections: Secondary | ICD-10-CM

## 2022-10-07 DIAGNOSIS — C8339 Diffuse large B-cell lymphoma, extranodal and solid organ sites: Secondary | ICD-10-CM | POA: Diagnosis not present

## 2022-10-07 DIAGNOSIS — I2510 Atherosclerotic heart disease of native coronary artery without angina pectoris: Secondary | ICD-10-CM | POA: Diagnosis not present

## 2022-10-07 DIAGNOSIS — E7800 Pure hypercholesterolemia, unspecified: Secondary | ICD-10-CM

## 2022-10-07 DIAGNOSIS — J3081 Allergic rhinitis due to animal (cat) (dog) hair and dander: Secondary | ICD-10-CM | POA: Diagnosis not present

## 2022-10-07 DIAGNOSIS — E559 Vitamin D deficiency, unspecified: Secondary | ICD-10-CM

## 2022-10-07 DIAGNOSIS — M810 Age-related osteoporosis without current pathological fracture: Secondary | ICD-10-CM

## 2022-10-07 DIAGNOSIS — Z4800 Encounter for change or removal of nonsurgical wound dressing: Secondary | ICD-10-CM | POA: Diagnosis not present

## 2022-10-07 DIAGNOSIS — I951 Orthostatic hypotension: Secondary | ICD-10-CM

## 2022-10-07 DIAGNOSIS — Z96653 Presence of artificial knee joint, bilateral: Secondary | ICD-10-CM

## 2022-10-07 DIAGNOSIS — Z6830 Body mass index (BMI) 30.0-30.9, adult: Secondary | ICD-10-CM

## 2022-10-07 DIAGNOSIS — J449 Chronic obstructive pulmonary disease, unspecified: Secondary | ICD-10-CM | POA: Diagnosis not present

## 2022-10-07 DIAGNOSIS — J301 Allergic rhinitis due to pollen: Secondary | ICD-10-CM | POA: Diagnosis not present

## 2022-10-07 DIAGNOSIS — N3289 Other specified disorders of bladder: Secondary | ICD-10-CM | POA: Diagnosis not present

## 2022-10-07 DIAGNOSIS — J3089 Other allergic rhinitis: Secondary | ICD-10-CM | POA: Diagnosis not present

## 2022-10-07 DIAGNOSIS — F341 Dysthymic disorder: Secondary | ICD-10-CM

## 2022-10-07 NOTE — Telephone Encounter (Signed)
Rec'd fax pt requesting refill on Clonazepam../l,mb

## 2022-10-08 DIAGNOSIS — C8510 Unspecified B-cell lymphoma, unspecified site: Secondary | ICD-10-CM | POA: Diagnosis not present

## 2022-10-08 DIAGNOSIS — W19XXXA Unspecified fall, initial encounter: Secondary | ICD-10-CM | POA: Diagnosis not present

## 2022-10-08 DIAGNOSIS — N179 Acute kidney failure, unspecified: Secondary | ICD-10-CM | POA: Diagnosis not present

## 2022-10-08 DIAGNOSIS — I4891 Unspecified atrial fibrillation: Secondary | ICD-10-CM | POA: Diagnosis not present

## 2022-10-08 MED ORDER — CLONAZEPAM 1 MG PO TABS: 1.0000 mg | ORAL_TABLET | Freq: Three times a day (TID) | ORAL | 1 refills | Status: DC | PRN

## 2022-10-08 NOTE — Telephone Encounter (Signed)
OK. Thx

## 2022-10-13 DIAGNOSIS — Z79899 Other long term (current) drug therapy: Secondary | ICD-10-CM | POA: Diagnosis not present

## 2022-10-13 DIAGNOSIS — Z87891 Personal history of nicotine dependence: Secondary | ICD-10-CM | POA: Diagnosis not present

## 2022-10-13 DIAGNOSIS — Z79624 Long term (current) use of inhibitors of nucleotide synthesis: Secondary | ICD-10-CM | POA: Diagnosis not present

## 2022-10-13 DIAGNOSIS — F1090 Alcohol use, unspecified, uncomplicated: Secondary | ICD-10-CM | POA: Diagnosis not present

## 2022-10-13 DIAGNOSIS — C8330 Diffuse large B-cell lymphoma, unspecified site: Secondary | ICD-10-CM | POA: Diagnosis not present

## 2022-10-13 DIAGNOSIS — R918 Other nonspecific abnormal finding of lung field: Secondary | ICD-10-CM | POA: Diagnosis not present

## 2022-10-14 DIAGNOSIS — F411 Generalized anxiety disorder: Secondary | ICD-10-CM | POA: Diagnosis not present

## 2022-10-14 DIAGNOSIS — I4891 Unspecified atrial fibrillation: Secondary | ICD-10-CM | POA: Diagnosis not present

## 2022-10-14 DIAGNOSIS — I5032 Chronic diastolic (congestive) heart failure: Secondary | ICD-10-CM | POA: Diagnosis not present

## 2022-10-14 DIAGNOSIS — J449 Chronic obstructive pulmonary disease, unspecified: Secondary | ICD-10-CM | POA: Diagnosis not present

## 2022-10-14 DIAGNOSIS — I10 Essential (primary) hypertension: Secondary | ICD-10-CM | POA: Diagnosis not present

## 2022-10-14 DIAGNOSIS — L98419 Non-pressure chronic ulcer of buttock with unspecified severity: Secondary | ICD-10-CM | POA: Diagnosis not present

## 2022-10-14 DIAGNOSIS — I509 Heart failure, unspecified: Secondary | ICD-10-CM | POA: Diagnosis not present

## 2022-10-18 ENCOUNTER — Telehealth (INDEPENDENT_AMBULATORY_CARE_PROVIDER_SITE_OTHER): Payer: Medicare PPO | Admitting: Family

## 2022-10-18 ENCOUNTER — Encounter (HOSPITAL_BASED_OUTPATIENT_CLINIC_OR_DEPARTMENT_OTHER): Payer: Self-pay | Admitting: Family

## 2022-10-18 ENCOUNTER — Telehealth: Payer: Self-pay

## 2022-10-18 VITALS — BP 124/64 | HR 98 | Ht 76.0 in | Wt 229.8 lb

## 2022-10-18 DIAGNOSIS — I5022 Chronic systolic (congestive) heart failure: Secondary | ICD-10-CM | POA: Diagnosis not present

## 2022-10-18 DIAGNOSIS — I4811 Longstanding persistent atrial fibrillation: Secondary | ICD-10-CM | POA: Diagnosis not present

## 2022-10-18 DIAGNOSIS — I10 Essential (primary) hypertension: Secondary | ICD-10-CM | POA: Diagnosis not present

## 2022-10-18 DIAGNOSIS — I25118 Atherosclerotic heart disease of native coronary artery with other forms of angina pectoris: Secondary | ICD-10-CM

## 2022-10-18 DIAGNOSIS — E785 Hyperlipidemia, unspecified: Secondary | ICD-10-CM

## 2022-10-18 NOTE — Progress Notes (Signed)
Office Visit    Patient Name: [name] Date of Encounter: 10/18/2022  PCP:  Cassandria Anger, MD   [place]  Cardiologist:  Skeet Latch, MD  Advanced Practice Provider:  No care team member to display Electrophysiologist:  None     Chief Complaint    [name] is a 77 y.o. male presents today for hospital follow-up  Past Medical History    Past Medical History:  Diagnosis Date   Benign localized prostatic hyperplasia with lower urinary tract symptoms (LUTS)    CAD (coronary artery disease) cardiologist--  dr t. Oval Linsey   hx of known CAD obstruction w/ collaterals (cath done @ [facility] 12-31-2011) ;   last cardiac cath 08-13-2019  showed sig. 2V CAD involving proxLAD and CTO of the RCA/  chronic total occlusion midRCA w/ bridging and L>R collaterals   Diastolic CHF ([place])    dx [id number] hospital admission (followed by pcp)   Diffuse large B cell lymphoma [facility]) oncologist-- dr Irene Limbo--- in remission   dx 08/ 2019 -- bx left tonsill mass-- involving lymph nodes--- completed chemotherapy 10-12-2018   DJD (degenerative joint disease)    Dysthymic disorder    Environmental allergies    GAD (generalized anxiety disorder)    with dysthymia   GERD (gastroesophageal reflux disease)    History of falling    multiple times   History of kidney stones    HLD (hyperlipidemia)    Hyperlipidemia    Hypertension    Hypogonadism in male    OA (osteoarthritis)    OSA on CPAP    cpap machine-settings 17   Osteoporosis with fracture 04/24/2013   Persistent atrial fibrillation ([place])    On Xarelto, cardiologist--- dr Alfonso Patten. [place]   Plaque psoriasis    dermatologist--- dr Harriett Sine--- currently taking otezla   Post-surgical hypothyroidism    followed by pcp---  s/p total thyroidectomy for graves disease in 1987   Wears hearing aid in both ears    Past Surgical History:  Procedure Laterality Date   APPENDECTOMY  2011 approx.     CARDIAC CATHETERIZATION  12-31-2011   dr j. Beatrix Fetters '@[facility]'$    normal LVF w/ multivessel CAD,  occluded RCA w/ colleterals   CATARACT EXTRACTION W/ INTRAOCULAR LENS  IMPLANT, BILATERAL  2016 approx.   COLONOSCOPY     CYSTOSCOPY WITH RETROGRADE PYELOGRAM, URETEROSCOPY AND STENT PLACEMENT Left 11/07/2017   Procedure: CYSTOSCOPY WITH RETROGRADE PYELOGRAM, URETEROSCOPY AND STENT PLACEMENT;  Surgeon: Cleon Gustin, MD;  Location: [facility];  Service: Urology;  Laterality: Left;   CYSTOSCOPY WITH RETROGRADE PYELOGRAM, URETEROSCOPY AND STENT PLACEMENT Right 10/22/2019   Procedure: CYSTOSCOPY WITH RETROGRADE PYELOGRAM, URETEROSCOPY AND STENT PLACEMENT;  Surgeon: Cleon Gustin, MD;  Location: [facility];  Service: Urology;  Laterality: Right;   CYSTOSCOPY/URETEROSCOPY/HOLMIUM LASER/STENT PLACEMENT Right 03/11/2017   Procedure: CYSTOSCOPY/URETEROSCOPYSTENT PLACEMENT right ureter retrograde pylegram;  Surgeon: Cleon Gustin, MD;  [facility];  Service: Urology;  Laterality: Right;   HARDWARE REMOVAL  10/05/2012   Procedure: HARDWARE REMOVAL;  Surgeon: [name], MD;  [facility];  Service: Orthopedics;  Laterality: Right;  REMOVING  STRYKER  GAMMA NAIL   HARDWARE REMOVAL Right 07/03/2013   Procedure: HARDWARE REMOVAL RIGHT TIBIA ;  Surgeon: Rozanna Box, MD;  Location: [place];  Service: Orthopedics;  Laterality: Right;   HIP CLOSED REDUCTION Right 10/15/2013   Procedure: CLOSED MANIPULATION HIP;  Surgeon: [name], MD;  [facility];  Service: Orthopedics;  Laterality: Right;   HOLMIUM LASER APPLICATION Right 10/20/5091   Procedure: HOLMIUM LASER APPLICATION;  Surgeon: Cleon Gustin, MD;  [facility];  Service: Urology;  Laterality: Right;   HOLMIUM LASER APPLICATION Left 2/67/1245   Procedure: HOLMIUM LASER APPLICATION;  Surgeon: Cleon Gustin, MD;  [facility];  Service: Urology;  Laterality: Left;   HOLMIUM LASER APPLICATION Left 8/0/9983    Procedure: HOLMIUM LASER APPLICATION;  Surgeon: Cleon Gustin, MD;  [facility];  Service: Urology;  Laterality: Left;   HOLMIUM LASER APPLICATION Right 3/82/5053   Procedure: HOLMIUM LASER APPLICATION;  Surgeon: Cleon Gustin, MD;  Location: [facility];  Service: Urology;  Laterality: Right;   INCISION AND DRAINAGE HIP Right 11/16/2013   Procedure: IRRIGATION AND DEBRIDEMENT RIGHT HIP;  Surgeon: [name], MD;  [facility];  Service: Orthopedics;  Laterality: Right;   INTRAVASCULAR PRESSURE WIRE/FFR STUDY N/A 08/13/2019   Procedure: INTRAVASCULAR PRESSURE WIRE/FFR STUDY;  Surgeon: Nelva Bush, MD;  Location: [place] [facility];  Service: Cardiovascular;  Laterality: N/A;   IR IMAGING GUIDED PORT INSERTION  06/22/2018   IR NEPHROSTOMY PLACEMENT LEFT  03/28/2019   IR URETERAL STENT LEFT NEW ACCESS W/O SEP NEPHROSTOMY CATH  10/24/2017   IR URETERAL STENT LEFT NEW ACCESS W/O SEP NEPHROSTOMY CATH  03/26/2019   NEPHROLITHOTOMY Right 02/08/2017   Procedure: NEPHROLITHOTOMY PERCUTANEOUS WITH SURGEON ACCESS;  Surgeon: Cleon Gustin, MD;  [facility];  Service: Urology;  Laterality: Right;   NEPHROLITHOTOMY Left 10/24/2017   Procedure: NEPHROLITHOTOMY PERCUTANEOUS;  Surgeon: Cleon Gustin, MD;  [facility];  Service: Urology;  Laterality: Left;   NEPHROLITHOTOMY Left 03/26/2019   Procedure: NEPHROLITHOTOMY PERCUTANEOUS;  Surgeon: Cleon Gustin, MD;  [facility];  Service: Urology;  Laterality: Left;  2 HRS   NEPHROLITHOTOMY Left 04/17/2019   Procedure: NEPHROLITHOTOMY PERCUTANEOUS;  Surgeon: Cleon Gustin, MD;  [facility];  Service: Urology;  Laterality: Left;  2 HRS   ORIF TIBIA FRACTURE Right 02/06/2013   Procedure: OPEN REDUCTION INTERNAL FIXATION (ORIF) TIBIA FRACTURE WITH IM ROD FIBULA;  Surgeon: Rozanna Box, MD;  Location: [place];  Service: Orthopedics;  Laterality: Right;   ORIF TIBIA FRACTURE Right 07/03/2013    Procedure: RIGHT TIBIA NON UNION REPAIR ;  Surgeon: Rozanna Box, MD;  Location: [place];  Service: Orthopedics;  Laterality: Right;   ORIF WRIST FRACTURE  10/02/2012   Procedure: OPEN REDUCTION INTERNAL FIXATION (ORIF) WRIST FRACTURE;  Surgeon: Roseanne Kaufman, MD;  [facility];  Service: Orthopedics;  Laterality: Right;  WITH   ANTIBIOTIC  CEMENT   ORIF WRIST FRACTURE Left 10/28/2013   Procedure: OPEN REDUCTION INTERNAL FIXATION (ORIF) WRIST FRACTURE with allograft;  Surgeon: Roseanne Kaufman, MD;  [facility];  Service: Orthopedics;  Laterality: Left;  DVR Plate   QUADRICEPS TENDON REPAIR Left 07/15/2017   Procedure: REPAIR QUADRICEP TENDON;  Surgeon: Frederik Pear, MD;  Location: [place];  Service: Orthopedics;  Laterality: Left;   RIGHT/LEFT HEART CATH AND CORONARY ANGIOGRAPHY N/A 08/13/2019   Procedure: RIGHT/LEFT HEART CATH AND CORONARY ANGIOGRAPHY;  Surgeon: Nelva Bush, MD;  Location: [place] [facility];  Service: Cardiovascular;  Laterality: N/A;   THYROIDECTOMY  02/1986   TOTAL HIP ARTHROPLASTY Right 03-16-2016   '@[facility]'$    TOTAL HIP REVISION  10/05/2012   Procedure: TOTAL HIP REVISION;  Surgeon: [name], MD;  [facility];  Service:  Orthopedics;  Laterality: Right;  RIGHT TOTAL HIP REVISION   TOTAL HIP REVISION Right 09/17/2013   Procedure: REVISION RIGHT TOTAL HIP ARTHROPLASTY ;  Surgeon: [name], MD;  [facility];  Service: Orthopedics;  Laterality: Right;   TOTAL HIP REVISION Right 10/26/2013   Procedure: REVISION RIGHT TOTAL HIP ARTHROPLASTY;  Surgeon: [name], MD;  [facility];  Service: Orthopedics;  Laterality: Right;   TOTAL KNEE ARTHROPLASTY Bilateral right 03-15-2011;  left 06-30-2011   TOTAL KNEE REVISION Left 04/11/2017   Procedure: TOTAL KNEE REVISION PATELLA and TIBIA;  Surgeon: Frederik Pear, MD;  Location: [place];  Service: Orthopedics;  Laterality: Left;   Allergies  Allergies  Allergen Reactions   Short Ragweed Pollen Ext Cough     History of Present Illness    [name] is a 77 y.o. male with a hx of relapsed large B-cell lymphoma, CAD, recurrent UTI, atrial fibrillation, BPH, diastolic heart failure, hypertension, hyperlipidemia, OSA, hypothyroid, prior tobacco use last seen 05/2022  Initially diagnosed atrial fibrillation around 2012 and started on diltiazem and amiodarone.  Amiodarone discontinued due to persistent atrial fibrillation.  Underwent LHC at time of diagnosis with reported 90% obstructive of unknown vessel that developed collaterals.  He was started on Ranexa.  Echo 10/2016 EF 60 to 65%, severe bilateral atrial enlargement, IVC dilated.  He had no angina so Ranexa discontinued.  Diagnosed with diffuse large B-cell lymphoma 05/2018.  Completed 6 cycles of chemotherapy.  Based on echo 06/2018 EF 50 to 16%, normal diastolic function, apical hypokinesis.  Heart rate poorly controlled to metoprolol added.  Seen in the ED and BNP elevated to Lasix 20 mg daily added with improvement in breathing.  Echo 06/2019 EF 50-55%, RV mildly reduced systolic function.  Lasix was transitioned to hydrochlorothiazide due to urinary frequency and poor blood pressure control.  LHC 08/2019 significant two-vessel CAD sequential 60 to 70% mid LAD lesions and CTO of mid RCA with bridging left-to-right collaterals.  Consideration for two-vessel CABG versus medical management.  Deemed not candidate for surgery.  He was started on Imdur.  Due to kidney stones and urination HCTZ reduced and losartan increased.  03/22/22 chemo resumed due to recurrence of diffuse large b-cell lymphoma.   Admitted 7/13 - 04/27/2022 after presenting with fatigue, lightheadedness found to have severe sepsis treated with IVF and IV abx due to urinary tract infection E. coli ESBL. Due to orthostatic hypotension and near syncope, diltiazem dose reduced. Xarelto and aspirin held due to thrombocytopenia.   Seen 05/04/2022 by his oncology team and referred to  infectious disease for recurrent UTI.  He was also set up for IV ABX through home care.  Seen 05/17/22 with Xarelto on hold due to thrombocytosis but able to be resumed.   Multiple ED visits 06/2022 for falls. Admitted 10/10-10/13/23 with recurrent falls and orthostatic hypotension. MRI brain negative. Diltiazem was discontinued.  Admitted 11/26-11/28/23 with UTI treated with abx and AKI deemed due to dehydration. Echo during admission with EF 35-40%  Admitted 12/6-12/14/23 with AKI (creatinine 1.8 with baseline 1.1) treated with IVF and hematuria. Xarelto was held and to follow up with urology outpatient.  He presents today for follow up via phone with the assistance of his husband [name].  He did some [facility] at [facility] and felt his muscle strength is improved.  He follows with Dr. Junious Silk of urology and they have stopped Alfuzosin and started Finasteride. Per his report last report showed bladder wall thickened but no  hematuria. He notes some dyspnea which improves with inhaler. No chest pain, edema. He has remained off Aspirin, Imdur, HCTZ, Xarelto. BP was low at home but more recently 120s/60s. No lightheadedness, dizziness.   EKGs/Labs/Other Studies Reviewed:   The following studies were reviewed today:  Echo 09/06/22  1. Left ventricular ejection fraction, by estimation, is 35 to 40%. The  left ventricle has moderately decreased function. The left ventricle  demonstrates global hypokinesis. Left ventricular diastolic parameters are  indeterminate.   2. Right ventricular systolic function is mildly reduced. The right  ventricular size is normal. Tricuspid regurgitation signal is inadequate  for assessing PA pressure.   3. Left atrial size was mildly dilated.   4. Right atrial size was mildly dilated.   5. The mitral valve is normal in structure. No evidence of mitral valve  regurgitation. No evidence of mitral stenosis.   6. The aortic valve was not well visualized. There  is mild calcification  of the aortic valve. Aortic valve regurgitation is not visualized. No  aortic stenosis is present.   7. The inferior vena cava is dilated in size with >50% respiratory  variability, suggesting right atrial pressure of 8 mmHg.   8. The patient was in atrial fibrillation.  EKG: No EKG today  Recent Labs: 09/06/2022: TSH 14.031 09/16/2022: ALT 28; Magnesium 1.9 09/19/2022: BUN 34; Creatinine, Ser 1.24; Potassium 4.2; Sodium 137 09/20/2022: Hemoglobin 12.0; Platelets 129  Recent Lipid Panel    Component Value Date/Time   CHOL 102 10/28/2021 1001   TRIG 64 10/28/2021 1001   HDL 45 10/28/2021 1001   CHOLHDL 2.3 10/28/2021 1001   CHOLHDL 2.1 02/24/2021 1037   VLDL 18 02/24/2021 1037   LDLCALC 43 10/28/2021 1001    Risk Assessment/Calculations:   CHA2DS2-VASc Score = 4   This indicates a 4.8% annual risk of stroke. The patient's score is based upon: CHF History: 1 HTN History: 1 Diabetes History: 0 Stroke History: 0 Vascular Disease History: 0 Age Score: 2 Gender Score: 0   Home Medications   Current Meds  Medication Sig   acetaminophen (TYLENOL) 325 MG tablet Take 2 tablets (650 mg total) by mouth every 6 (six) hours as needed for mild pain (or Fever >/= 101).   acyclovir (ZOVIRAX) 400 MG tablet Take 400 mg by mouth 2 (two) times daily.   albuterol (VENTOLIN HFA) 108 (90 Base) MCG/ACT inhaler Inhale 2 puffs into the lungs every 6 (six) hours as needed for wheezing or shortness of breath.   alfuzosin (UROXATRAL) 10 MG 24 hr tablet TAKE 1 TABLET AT BEDTIME   atorvastatin (LIPITOR) 80 MG tablet TAKE 1 TABLET EVERY DAY (Patient taking differently: Take 80 mg by mouth at bedtime.)   buPROPion (WELLBUTRIN XL) 300 MG 24 hr tablet TAKE 1 TABLET EVERY DAY WITH BREAKFAST (Patient taking differently: Take 300 mg by mouth daily.)   Calcium Citrate-Vitamin D (CALCIUM CITRATE + D PO) Take 1 tablet by mouth 2 (two) times daily. '1200mg'$  of Calcium and 1000 units vitamin  D3   Cholecalciferol (VITAMIN D) 50 MCG (2000 UT) tablet Take 4,000 Units by mouth in the morning and at bedtime.   clonazePAM (KLONOPIN) 1 MG tablet Take 1 tablet (1 mg total) by mouth 3 (three) times daily as needed for anxiety.   CRANBERRY PO Take 1 tablet by mouth in the morning and at bedtime.   denosumab (PROLIA) 60 MG/ML SOSY injection Inject 60 mg as directed every 6 (six) months.   DULoxetine (CYMBALTA) 60  MG capsule TAKE 1 CAPSULE TWICE DAILY (Patient taking differently: Take 60 mg by mouth 2 (two) times daily.)   EPINEPHrine 0.3 mg/0.3 mL IJ SOAJ injection Inject 0.3 mg into the muscle once as needed for anaphylaxis (severe allergic reaction).   ezetimibe (ZETIA) 10 MG tablet TAKE 1 TABLET EVERY DAY   feeding supplement (ENSURE ENLIVE / ENSURE PLUS) LIQD Take 237 mLs by mouth 2 (two) times daily between meals. (Patient taking differently: Take 237 mLs by mouth daily.)   ferrous sulfate 325 (65 FE) MG tablet Take 325 mg by mouth 2 (two) times daily with a meal.   levothyroxine (SYNTHROID) 125 MCG tablet Take 1 tablet (125 mcg total) by mouth daily at 6 (six) AM.   lidocaine-prilocaine (EMLA) cream Apply 1 Application topically as needed. (Patient taking differently: Apply 1 Application topically as needed (For port access/chemotherapy).)   loratadine (CLARITIN) 10 MG tablet Take 10 mg by mouth daily.   Magnesium 250 MG TABS Take 250 mg by mouth daily.   metoprolol tartrate (LOPRESSOR) 25 MG tablet Take 25 mg by mouth 2 (two) times daily.   Multiple Vitamin (MULTIVITAMIN WITH MINERALS) TABS tablet Take 1 tablet by mouth every morning. Men's One-A-Day 50+   MYRBETRIQ 25 MG TB24 tablet TAKE 1 TABLET EVERY DAY (Patient taking differently: Take 25 mg by mouth at bedtime.)   nitroGLYCERIN (NITROSTAT) 0.4 MG SL tablet Place 0.4 mg under the tongue every 5 (five) minutes as needed for chest pain.   omeprazole (PRILOSEC) 40 MG capsule TAKE 1 CAPSULE EVERY DAY BEFORE BREAKFAST (Patient taking  differently: Take 40 mg by mouth daily.)   Potassium Citrate 15 MEQ (1620 MG) TBCR Take 1 tablet by mouth See admin instructions. Take one tablet (15 meq) by mouth twice daily - with lunch and supper (Patient taking differently: Take 1 tablet by mouth in the morning and at bedtime.)   Probiotic Product (PROBIOTIC PO) Take 1 capsule by mouth daily with breakfast.   Risankizumab-rzaa (SKYRIZI, 150 MG DOSE, [place]) Inject 150 mg into the skin every 4 (four) months.   testosterone enanthate (DELATESTRYL) 200 MG/ML injection INJECT 1ML ('200MG'$ ) INTO THE MUSCLE EVERY 14 DAYS. DISCARD VIAL AFTER 28 DAYS. (Patient taking differently: Inject 200 mg into the muscle every 14 (fourteen) days. Every other Friday)   vitamin C (ASCORBIC ACID) 500 MG tablet Take 500 mg by mouth daily with lunch.     Review of Systems      All other systems reviewed and are otherwise negative except as noted above.  Physical Exam    VS:  BP 124/64   Pulse 98   Ht '6\' 4"'$  (1.93 m)   Wt 229 lb 12.8 oz (104.2 kg)   BMI 27.97 kg/m  , BMI Body mass index is 27.97 kg/m.  Wt Readings from Last 3 Encounters:  10/18/22 229 lb 12.8 oz (104.2 kg)  09/20/22 237 lb 10.5 oz (107.8 kg)  09/05/22 232 lb (105.2 kg)    GEN: Well nourished, well developed, in no acute distress. HEENT: normal. Neck: Supple, no JVD, carotid bruits, or masses. Cardiac: IRIR, no murmurs, rubs, or gallops. No clubbing, cyanosis, edema.  Radials/PT 2+ and equal bilaterally.  Respiratory:  Respirations regular and unlabored, clear to auscultation bilaterally. GI: Soft, nontender, nondistended. MS: No deformity or atrophy. Skin: Warm and dry, no rash. Neuro:  Strength and sensation are intact. Psych: Normal affect.  Assessment & Plan    Longstanding persistent atrial fibrillation - Xarelto on hold due to hematuria -  will reach out to Dr. Junious Silk for input. Per Dr. Irene Limbo from hematology perspective okay if platelet count >75K. CHA2DS2-VASc Score = 4 [CHF  History: 1, HTN History: 1, Diabetes History: 0, Stroke History: 0, Vascular Disease History: 0, Age Score: 2, Gender Score: 0].  Therefore, the patient's annual risk of stroke is 4.8 %. Rate controlled on Metoprolol tartrate '25mg'$  BId. Diltiazem previously discontinued due to hypotension.   B cell lymphoma - Relapse on chemo since 03/2022. Follows with Dr. Irene Limbo and Dr. Cassell Clement.  Recurrent UTI - Follows with Dr. Tommy Medal of ID and Dr. Junious Silk of urology.   Hypothyroidism - Continue to follow with PCP.   CAD -Medically managed moderate to severe CAD. Stable with no anginal symptoms. No indication for ischemic evaluation.  GDMT metoprolol, atorvastatin. Heart healthy diet and regular cardiovascular exercise encouraged.    Systolic heart failure - 72/5366 LVEF 35-40% newly reduced. Continue beta-blocker at present dose.Hesitant to add ARB, MRA due to previous hypotension with falls. Euvolemic per his report with no edema. No SGLT2i due to recurrent UTI. Could consider PRN loop diuretic if he reports fluid retention. Will check in via MyChart in 2 weeks. Recommend low salt diet, fluid restriction <2L.   Hypertension - BP well controlled. Continue current antihypertensive regimen.  Previously hypotensive which resolved after discontinuation of Diltiazem, Imdur, HCTZ.  Hyperlipidemia, LDL goal less than 70 -continue atorvastatin.  Denies myalgias.   Disposition: Follow up in 3 month(s)  with Skeet Latch, MD or APP.  Signed, Loel Dubonnet, NP 10/18/2022, 9:20 AM [place]

## 2022-10-18 NOTE — Patient Instructions (Signed)
Medication Instructions:  Continue current medications.   Loel Dubonnet, NP will out to Dr. Junious Silk about your Xarelto.   *If you need a refill on your cardiac medications before your next appointment, please call your pharmacy*  Lab Work/Testing/Procedures: None ordered today.  Follow-Up: At [facility], you and your health needs are our priority.  As part of our continuing mission to provide you with exceptional heart care, we have created designated Provider Care Teams.  These Care Teams include your primary Cardiologist (physician) and Advanced Practice Providers (APPs -  Physician Assistants and Nurse Practitioners) who all work together to provide you with the care you need, when you need it.  We recommend signing up for the patient portal called "MyChart".  Sign up information is provided on this After Visit Summary.  MyChart is used to connect with patients for Virtual Visits (Telemedicine).  Patients are able to view lab/test results, encounter notes, upcoming appointments, etc.  Non-urgent messages can be sent to your provider as well.   To learn more about what you can do with MyChart, go to [email or web address].    Your next appointment:   As scheduled in April It is scheduled for in person but if you need to switch to virtual let us know.   Other Instructions  Most recent echocardiogram showed ejection fraction (pumping function) 35-40% and normal is 50-55%. Sometimes lower heart muscle function can hold [place] to hold onto fluid which leads to shortness of breath (particularly while laying down or with activity) or swelling. If you notice swelling or worsening shortness of breath please let us know.   We prevent holding onto fluid by following a low salt diet and restricting to less than 2 liters (64 oz) per day.

## 2022-10-19 ENCOUNTER — Other Ambulatory Visit: Payer: Self-pay

## 2022-10-20 DIAGNOSIS — C8330 Diffuse large B-cell lymphoma, unspecified site: Secondary | ICD-10-CM | POA: Diagnosis not present

## 2022-10-20 NOTE — Telephone Encounter (Signed)
Okay.  Thanks.

## 2022-10-21 ENCOUNTER — Telehealth: Payer: Self-pay | Admitting: Internal Medicine

## 2022-10-21 DIAGNOSIS — H35443 Age-related reticular degeneration of retina, bilateral: Secondary | ICD-10-CM | POA: Diagnosis not present

## 2022-10-21 DIAGNOSIS — H524 Presbyopia: Secondary | ICD-10-CM | POA: Diagnosis not present

## 2022-10-21 DIAGNOSIS — H16223 Keratoconjunctivitis sicca, not specified as Sjogren's, bilateral: Secondary | ICD-10-CM | POA: Diagnosis not present

## 2022-10-21 DIAGNOSIS — H18413 Arcus senilis, bilateral: Secondary | ICD-10-CM | POA: Diagnosis not present

## 2022-10-21 DIAGNOSIS — Z961 Presence of intraocular lens: Secondary | ICD-10-CM | POA: Diagnosis not present

## 2022-10-21 NOTE — Telephone Encounter (Signed)
Don Warden/ranger from [facility] called needing orders for the patient to start occupational therapy 1 time a week for 3 weeks. Order is for IADL, exercise, and heart failure education. Timmothy Sours is also requesting for the patient to  be reevaluated by 10/31/2022 for a start for a new certification on 05/21/8866. Best callback number for Timmothy Sours is [phone]

## 2022-10-22 ENCOUNTER — Telehealth: Payer: Self-pay | Admitting: *Deleted

## 2022-10-22 NOTE — Telephone Encounter (Signed)
Kenney Houseman called back checking status of msg left on 10/18/22. Stephani Police MD response for verbal../lb

## 2022-10-22 NOTE — Telephone Encounter (Signed)
[name] had call checking status on verbal that was called on 10/18/22. MD replied back on 10/20/22.Joseph Police MD response.Marland KitchenJohny Lane

## 2022-10-24 NOTE — Telephone Encounter (Signed)
Okay.  Thanks.

## 2022-10-25 ENCOUNTER — Telehealth: Payer: Self-pay | Admitting: Internal Medicine

## 2022-10-25 NOTE — Telephone Encounter (Signed)
Called Don no LMOM w/ MD response.Marland KitchenJohny Lane

## 2022-10-25 NOTE — Telephone Encounter (Signed)
[name] with [facility] called to let us know that patient reports falling on 1/12 with no injuries.

## 2022-10-26 ENCOUNTER — Telehealth: Payer: Self-pay | Admitting: Internal Medicine

## 2022-10-26 NOTE — Telephone Encounter (Signed)
FYITimmothy Sours from [place] called.  Patient reports that he fell on Friday, 10/22/2022 trying to get in to vehicle.  Patient was distracted by someone smiling at him.  Patient reports no injury, does not need to see physician.  Therapist instructed patient to stay focused.

## 2022-10-27 DIAGNOSIS — C8330 Diffuse large B-cell lymphoma, unspecified site: Secondary | ICD-10-CM | POA: Diagnosis not present

## 2022-10-27 DIAGNOSIS — Z79899 Other long term (current) drug therapy: Secondary | ICD-10-CM | POA: Diagnosis not present

## 2022-10-27 NOTE — Telephone Encounter (Signed)
FYI

## 2022-10-27 NOTE — Telephone Encounter (Signed)
Noted. Thanks.

## 2022-11-01 ENCOUNTER — Telehealth: Payer: Self-pay | Admitting: Internal Medicine

## 2022-11-01 NOTE — Telephone Encounter (Signed)
[place] Name: April [place] Name: Santina Evans Phone #: [phone] Secure  Service Requested: Skilled Nursing (examples: OT/PT/Skilled Nursing/Social Work/Speech Therapy/Wound Care)  Frequency of Visits: 1 week 4 for continued wound care

## 2022-11-02 DIAGNOSIS — F410 Panic disorder [episodic paroxysmal anxiety] without agoraphobia: Secondary | ICD-10-CM | POA: Diagnosis not present

## 2022-11-02 DIAGNOSIS — F331 Major depressive disorder, recurrent, moderate: Secondary | ICD-10-CM | POA: Diagnosis not present

## 2022-11-03 DIAGNOSIS — C8330 Diffuse large B-cell lymphoma, unspecified site: Secondary | ICD-10-CM | POA: Diagnosis not present

## 2022-11-03 DIAGNOSIS — Z87891 Personal history of nicotine dependence: Secondary | ICD-10-CM | POA: Diagnosis not present

## 2022-11-03 DIAGNOSIS — Z79899 Other long term (current) drug therapy: Secondary | ICD-10-CM | POA: Diagnosis not present

## 2022-11-03 DIAGNOSIS — Z79624 Long term (current) use of inhibitors of nucleotide synthesis: Secondary | ICD-10-CM | POA: Diagnosis not present

## 2022-11-03 DIAGNOSIS — F1090 Alcohol use, unspecified, uncomplicated: Secondary | ICD-10-CM | POA: Diagnosis not present

## 2022-11-03 NOTE — Telephone Encounter (Signed)
Okay.  Thanks.

## 2022-11-04 ENCOUNTER — Telehealth: Payer: Self-pay | Admitting: Internal Medicine

## 2022-11-04 ENCOUNTER — Encounter (HOSPITAL_BASED_OUTPATIENT_CLINIC_OR_DEPARTMENT_OTHER): Payer: Self-pay

## 2022-11-04 NOTE — Telephone Encounter (Signed)
[place]:  [place]   Phone Number: [phone]   Needs Verbal orders for what service & frequency:  OT 1 x week for 3 weeks for fuctional mobility, heart failure education, health promotion, safety and infection prevention  effective 11/08/2022

## 2022-11-04 NOTE — Telephone Encounter (Signed)
Notified Don ok w/ verbal for OT. Pt is in good standing w/ appts. Next appt 11/15/22.Marland KitchenJohny Chess

## 2022-11-05 NOTE — Telephone Encounter (Signed)
I was able to give ok for verbal orders to April for Skilled Nursing (examples: OT/PT/Skilled Nursing/Social Work/Speech Therapy/Wound Care)

## 2022-11-05 NOTE — Telephone Encounter (Signed)
Pt. Responding to you!

## 2022-11-05 NOTE — Telephone Encounter (Signed)
Called and spoke with [name] and his husband, [name].   CHA2DS2-VASc Score = 4 [CHF History: 1, HTN History: 1, Diabetes History: 0, Stroke History: 0, Vascular Disease History: 0, Age Score: 2, Gender Score: 0].  Therefore, the patient's annual risk of stroke is 4.8 %.    Known persistent atrial fibrillation.   [place] previously on hold due to hematuria, discussed with Dr. Junious Silk and as no recurrent hematuria could resume.  He is having frequent falls which he attributes to clumsiness. No near syncope, syncope. Previously was having labile blood pressure but they have been better controlled recently. Average currently 2 falls per week.  Given frequent falls, we discussed and agreed that the risks outweigh the benefits of anticoagulation at this time.   Loel Dubonnet, NP

## 2022-11-07 ENCOUNTER — Emergency Department (HOSPITAL_BASED_OUTPATIENT_CLINIC_OR_DEPARTMENT_OTHER)
Admission: EM | Admit: 2022-11-07 | Discharge: 2022-11-07 | Disposition: A | Discharge status: Home / Self Care | Payer: Medicare PPO | Attending: Emergency Medicine | Admitting: Emergency Medicine

## 2022-11-07 ENCOUNTER — Other Ambulatory Visit: Payer: Self-pay

## 2022-11-07 ENCOUNTER — Emergency Department (HOSPITAL_BASED_OUTPATIENT_CLINIC_OR_DEPARTMENT_OTHER): Payer: Medicare PPO

## 2022-11-07 DIAGNOSIS — S0101XA Laceration without foreign body of scalp, initial encounter: Secondary | ICD-10-CM | POA: Diagnosis not present

## 2022-11-07 DIAGNOSIS — Z79899 Other long term (current) drug therapy: Secondary | ICD-10-CM | POA: Diagnosis not present

## 2022-11-07 DIAGNOSIS — W19XXXA Unspecified fall, initial encounter: Secondary | ICD-10-CM | POA: Diagnosis not present

## 2022-11-07 DIAGNOSIS — S0990XA Unspecified injury of head, initial encounter: Secondary | ICD-10-CM | POA: Diagnosis not present

## 2022-11-07 MED ORDER — LIDOCAINE-EPINEPHRINE (PF) 2 %-1:200000 IJ SOLN
10.0000 mL | Freq: Once | INTRAMUSCULAR | Status: DC
  Filled 2022-11-07: qty 20

## 2022-11-07 NOTE — ED Notes (Signed)
Pt was d/c by prior shift RN

## 2022-11-07 NOTE — ED Provider Notes (Signed)
[facility] Provider Note   CSN: [id number] Arrival date & time: 11/07/22  1300     History  Chief Complaint  Patient presents with   [facility] Injury    [name] is a 77 y.o. male.  77 yo M with a chief complaint of fall.  Patient was backing up and lost his balance and fell back and struck his head.  He denies any issues before or after the fall.  Was noted to have a laceration to his scalp and there was concern that he needed to have it repaired.  Came here for evaluation.   Fall  Head Injury      Home Medications Prior to Admission medications   Medication Sig Start Date End Date Taking? Authorizing Provider  acetaminophen (TYLENOL) 325 MG tablet Take 2 tablets (650 mg total) by mouth every 6 (six) hours as needed for mild pain (or Fever >/= 101). 03/25/22   Raiford Noble Latif, DO  acyclovir (ZOVIRAX) 400 MG tablet Take 400 mg by mouth 2 (two) times daily. 08/11/22   [provider]  albuterol (VENTOLIN HFA) 108 (90 Base) MCG/ACT inhaler Inhale 2 puffs into the lungs every 6 (six) hours as needed for wheezing or shortness of breath.    [provider]  alfuzosin (UROXATRAL) 10 MG 24 hr tablet TAKE 1 TABLET AT BEDTIME 08/10/22   McKenzie, Candee Furbish, MD  atorvastatin (LIPITOR) 80 MG tablet TAKE 1 TABLET EVERY DAY Patient taking differently: Take 80 mg by mouth at bedtime. 08/09/22   Loel Dubonnet, NP  buPROPion (WELLBUTRIN XL) 300 MG 24 hr tablet TAKE 1 TABLET EVERY DAY WITH BREAKFAST Patient taking differently: Take 300 mg by mouth daily. 07/19/22   Plotnikov, Evie Lacks, MD  Calcium Citrate-Vitamin D (CALCIUM CITRATE + D PO) Take 1 tablet by mouth 2 (two) times daily. '1200mg'$  of Calcium and 1000 units vitamin D3    [provider]  Cholecalciferol (VITAMIN D) 50 MCG (2000 UT) tablet Take 4,000 Units by mouth in the morning and at bedtime.    [provider]  clonazePAM (KLONOPIN) 1  MG tablet Take 1 tablet (1 mg total) by mouth 3 (three) times daily as needed for anxiety. 10/08/22   Plotnikov, Evie Lacks, MD  CRANBERRY PO Take 1 tablet by mouth in the morning and at bedtime.    [provider]  denosumab (PROLIA) 60 MG/ML SOSY injection Inject 60 mg as directed every 6 (six) months. 12/10/15   [provider]  DULoxetine (CYMBALTA) 60 MG capsule TAKE 1 CAPSULE TWICE DAILY Patient taking differently: Take 60 mg by mouth 2 (two) times daily. 07/19/22   Plotnikov, Evie Lacks, MD  EPINEPHrine 0.3 mg/0.3 mL IJ SOAJ injection Inject 0.3 mg into the muscle once as needed for anaphylaxis (severe allergic reaction). 07/31/21   [provider]  ezetimibe (ZETIA) 10 MG tablet TAKE 1 TABLET EVERY DAY 09/27/22   Skeet Latch, MD  feeding supplement (ENSURE ENLIVE / ENSURE PLUS) LIQD Take 237 mLs by mouth 2 (two) times daily between meals. Patient taking differently: Take 237 mLs by mouth daily. 03/25/22   Raiford Noble Latif, DO  ferrous sulfate 325 (65 FE) MG tablet Take 325 mg by mouth 2 (two) times daily with a meal.    [provider]  levothyroxine (SYNTHROID) 125 MCG tablet Take 1 tablet (125 mcg total) by mouth daily at 6 (six) AM. 08/23/22   Plotnikov, Evie Lacks,  MD  lidocaine-prilocaine (EMLA) cream Apply 1 Application topically as needed. Patient taking differently: Apply 1 Application topically as needed (For port access/chemotherapy). 04/21/22   Brunetta Genera, MD  loratadine (CLARITIN) 10 MG tablet Take 10 mg by mouth daily.    [provider]  Magnesium 250 MG TABS Take 250 mg by mouth daily.    [provider]  metoprolol tartrate (LOPRESSOR) 25 MG tablet Take 25 mg by mouth 2 (two) times daily.    [provider]  Multiple Vitamin (MULTIVITAMIN WITH MINERALS) TABS tablet Take 1 tablet by mouth every morning. Men's One-A-Day 50+    [provider]  MYRBETRIQ 25 MG TB24 tablet TAKE 1 TABLET EVERY  DAY Patient taking differently: Take 25 mg by mouth at bedtime. 10/06/21   McKenzie, Candee Furbish, MD  nitroGLYCERIN (NITROSTAT) 0.4 MG SL tablet Place 0.4 mg under the tongue every 5 (five) minutes as needed for chest pain.    [provider]  omeprazole (PRILOSEC) 40 MG capsule TAKE 1 CAPSULE EVERY DAY BEFORE BREAKFAST Patient taking differently: Take 40 mg by mouth daily. 07/19/22   Plotnikov, Evie Lacks, MD  Potassium Citrate 15 MEQ (1620 MG) TBCR Take 1 tablet by mouth See admin instructions. Take one tablet (15 meq) by mouth twice daily - with lunch and supper Patient taking differently: Take 1 tablet by mouth in the morning and at bedtime. 06/10/22   McKenzie, Candee Furbish, MD  Probiotic Product (PROBIOTIC PO) Take 1 capsule by mouth daily with breakfast.    [provider]  Risankizumab-rzaa (SKYRIZI, 150 MG DOSE, [place]) Inject 150 mg into the skin every 4 (four) months.    [provider]  testosterone enanthate (DELATESTRYL) 200 MG/ML injection INJECT 1ML ('200MG'$ ) INTO THE MUSCLE EVERY 14 DAYS. DISCARD VIAL AFTER 28 DAYS. Patient taking differently: Inject 200 mg into the muscle every 14 (fourteen) days. Every other Friday 09/22/21   Plotnikov, Evie Lacks, MD  vitamin C (ASCORBIC ACID) 500 MG tablet Take 500 mg by mouth daily with lunch.    [provider]      Allergies    Short ragweed pollen ext    Review of Systems   Review of Systems  Physical Exam Updated Vital Signs BP 122/83 (BP Location: Right Arm)   Pulse 82   Temp 97.8 F (36.6 C)   Resp 18   Ht '6\' 4"'$  (1.93 m)   Wt 106.6 kg   SpO2 100%   BMI 28.61 kg/m  Physical Exam Vitals and nursing note reviewed.  Constitutional:      Appearance: He is well-developed.  HENT:     Head: Normocephalic.     Comments: Right occipital laceration approximately 5 and half centimeters Eyes:     Pupils: Pupils are equal, round, and reactive to light.  Neck:     Vascular: No JVD.  Cardiovascular:      Rate and Rhythm: Normal rate and regular rhythm.     Heart sounds: No murmur heard.    No friction rub. No gallop.  Pulmonary:     Effort: No respiratory distress.     Breath sounds: No wheezing.  Abdominal:     General: There is no distension.     Tenderness: There is no abdominal tenderness. There is no guarding or rebound.  Musculoskeletal:        General: Normal range of motion.     Cervical back: Normal range of motion and neck supple.  Skin:  Coloration: Skin is not pale.     Findings: No rash.  Neurological:     Mental Status: He is alert and oriented to person, place, and time.  Psychiatric:        Behavior: Behavior normal.     ED Results / Procedures / Treatments   Labs (all labs ordered are listed, but only abnormal results are displayed) Labs Reviewed - No data to display  EKG None  Radiology CT Head Wo Contrast  Result Date: 11/07/2022 CLINICAL DATA:  Head trauma, minor (Age >= 65y) EXAM: CT HEAD WITHOUT CONTRAST TECHNIQUE: Contiguous axial images were obtained from the base of the skull through the vertex without intravenous contrast. RADIATION DOSE REDUCTION: This exam was performed according to the departmental dose-optimization program which includes automated exposure control, adjustment of the mA and/or kV according to patient size and/or use of iterative reconstruction technique. COMPARISON:  09/05/2022 FINDINGS: Brain: No evidence of acute infarction, hemorrhage, hydrocephalus, extra-axial collection or mass lesion/mass effect. Scattered low-density changes within the periventricular and subcortical white matter compatible with chronic microvascular ischemic change. Mild diffuse cerebral volume loss. Vascular: Atherosclerotic calcifications involving the large vessels of the skull base. No unexpected hyperdense vessel. Skull: Normal. Negative for fracture or focal lesion. Sinuses/Orbits: Nearly resolved right mastoid effusion. Other: None. IMPRESSION: 1. No  acute intracranial abnormality. 2. Chronic microvascular ischemic change and cerebral volume loss. 3. Nearly resolved right mastoid effusion. Electronically Signed   By: Davina Poke D.O.   On: 11/07/2022 14:03    Procedures .Marland KitchenLaceration Repair  Date/Time: 11/07/2022 2:18 PM  Performed by: Deno Etienne, DO Authorized by: Deno Etienne, DO   Consent:    Consent obtained:  Verbal   Consent given by:  Patient and spouse   Risks, benefits, and alternatives were discussed: yes     Risks discussed:  Infection, pain, poor cosmetic result and poor wound healing   Alternatives discussed:  No treatment, delayed treatment and observation Universal protocol:    Procedure explained and questions answered to patient or proxy's satisfaction: yes     Immediately prior to procedure, a time out was called: yes     Patient identity confirmed:  Verbally with patient Anesthesia:    Anesthesia method:  Local infiltration   Local anesthetic:  Lidocaine 2% WITH epi Laceration details:    Location:  Scalp   Scalp location:  Occipital   Length (cm):  8 Pre-procedure details:    Preparation:  Imaging obtained to evaluate for foreign bodies Exploration:    Limited defect created (wound extended): no     Hemostasis achieved with:  Epinephrine and direct pressure   Imaging obtained comment:  CT   Imaging outcome: foreign body not noted     Wound exploration: entire depth of wound visualized     Wound extent: no underlying fracture     Contaminated: no   Treatment:    Area cleansed with:  Saline   Amount of cleaning:  Extensive   Irrigation solution:  Sterile saline   Irrigation volume:  20   Irrigation method:  Syringe   Visualized foreign bodies/material removed: no     Debridement:  None   Undermining:  None Skin repair:    Repair method:  Staples   Number of staples:  6 Approximation:    Approximation:  Close Repair type:    Repair type:  Simple Post-procedure details:    Dressing:  Open (no  dressing)   Procedure completion:  Tolerated  Medications Ordered in ED Medications  lidocaine-EPINEPHrine (XYLOCAINE W/EPI) 2 %-1:200000 (PF) injection 10 mL (has no administration in time range)    ED Course/ Medical Decision Making/ A&P                             Medical Decision Making Amount and/or Complexity of Data Reviewed Radiology: ordered.  Risk Prescription drug management.   77 yo M with a chief complaints of a fall.  Nonsyncopal by history.  Complaining of wound to the right occiput.  Will obtain a CT of the head.  Repair wound at bedside.  CT of the head is negative for intracranial pathology is independently interpreted by me.  Wound repaired.  Discharge home.  2:19 PM:  I have discussed the diagnosis/risks/treatment options with the patient and family.  Evaluation and diagnostic testing in the emergency department does not suggest an emergent condition requiring admission or immediate intervention beyond what has been performed at this time.  They will follow up with PCP. We also discussed returning to the ED immediately if new or worsening sx occur. We discussed the sx which are most concerning (e.g., sudden worsening pain, fever, inability to tolerate by mouth) that necessitate immediate return. Medications administered to the patient during their visit and any new prescriptions provided to the patient are listed below.  Medications given during this visit Medications  lidocaine-EPINEPHrine (XYLOCAINE W/EPI) 2 %-1:200000 (PF) injection 10 mL (has no administration in time range)     The patient appears reasonably screen and/or stabilized for discharge and I doubt any other medical condition or other [facility] requiring further screening, evaluation, or treatment in the ED at this time prior to discharge.          Final Clinical Impression(s) / ED Diagnoses Final diagnoses:  Laceration of scalp, initial encounter    Rx / DC Orders ED Discharge Orders      None         Deno Etienne, DO 11/07/22 1419

## 2022-11-07 NOTE — ED Triage Notes (Signed)
Patient arrives with complaints of a mechanical fall earlier today. Patient states that he turned around too quickly and lost his balance, falling and hitting his head. Not currently on blood thinners (he has been off his xarelto for ~ 3 months).

## 2022-11-07 NOTE — Discharge Instructions (Signed)
Please let your family doctor know that you came to the emergency department.  Typically your family doctor wants to see you every time you have a fall, sometimes as a warning sign that they need to adjust your medications or perhaps the physical therapy.  Return for redness drainage or if you get a fever.  The staples that were placed typically removed sometime around a 7.  This can be done here at urgent care at your family doctor's office.  The area can get wet but not fully immersed underwater.  No scrubbing.  If you really want to clean it you can apply a half-and-half hydrogen peroxide solution with water on a Q-tip.  You can apply an ointment a couple times a day this could be as simple as Vaseline but could also be an antibiotic ointment if you wish.  Once it is healed please try to avoid prolonged sun exposure use sunscreen.  Gells that have silicone antigens have been shown to reduce scarring in some research.

## 2022-11-08 DIAGNOSIS — I739 Peripheral vascular disease, unspecified: Secondary | ICD-10-CM | POA: Diagnosis not present

## 2022-11-08 DIAGNOSIS — B351 Tinea unguium: Secondary | ICD-10-CM | POA: Diagnosis not present

## 2022-11-08 DIAGNOSIS — I872 Venous insufficiency (chronic) (peripheral): Secondary | ICD-10-CM | POA: Diagnosis not present

## 2022-11-08 DIAGNOSIS — L84 Corns and callosities: Secondary | ICD-10-CM | POA: Diagnosis not present

## 2022-11-10 DIAGNOSIS — C8330 Diffuse large B-cell lymphoma, unspecified site: Secondary | ICD-10-CM | POA: Diagnosis not present

## 2022-11-10 DIAGNOSIS — Z79624 Long term (current) use of inhibitors of nucleotide synthesis: Secondary | ICD-10-CM | POA: Diagnosis not present

## 2022-11-10 DIAGNOSIS — F1090 Alcohol use, unspecified, uncomplicated: Secondary | ICD-10-CM | POA: Diagnosis not present

## 2022-11-10 DIAGNOSIS — Z87891 Personal history of nicotine dependence: Secondary | ICD-10-CM | POA: Diagnosis not present

## 2022-11-10 DIAGNOSIS — Z79899 Other long term (current) drug therapy: Secondary | ICD-10-CM | POA: Diagnosis not present

## 2022-11-11 ENCOUNTER — Encounter: Payer: Self-pay | Admitting: Internal Medicine

## 2022-11-11 DIAGNOSIS — H903 Sensorineural hearing loss, bilateral: Secondary | ICD-10-CM | POA: Diagnosis not present

## 2022-11-11 DIAGNOSIS — H9313 Tinnitus, bilateral: Secondary | ICD-10-CM | POA: Diagnosis not present

## 2022-11-12 ENCOUNTER — Telehealth: Payer: Self-pay

## 2022-11-12 NOTE — Telephone Encounter (Signed)
2 falls on 11/10/2022. NO injury with either fall or complaints. Pt was not paying attention when walking lost focus and fell. 2nd fall was when pt was rushing and tripped over his own feet.  Pt has significant weight gain of 10/19/2022 233lbs, 10/21/2022 229lbs, 11/01/2022 233.8lbs, 11/12/2022 afternoon pt was 239.6 lbs, morning of 11/12/2022 pt was 241lbs.   **NO complaints, no SOB, no extra edema.  Donald with [facility] can be reached at [phone]. Elenore Rota has instructed pt to focus more and not lose focus when walking.

## 2022-11-15 ENCOUNTER — Telehealth: Payer: Self-pay | Admitting: Internal Medicine

## 2022-11-15 NOTE — Telephone Encounter (Signed)
Noted! Thank you

## 2022-11-15 NOTE — Telephone Encounter (Signed)
  Caller Name: April [place] Name: Santina Evans Phone #: [phone] Secure  Service Requested: staple removal. Staples were placed on 28th. Would like them removed on the 8th or 9th, if possible (examples: OT/PT/Skilled Nursing/Social Work/Speech Therapy/Wound Care)

## 2022-11-15 NOTE — Telephone Encounter (Signed)
Okay. Thank you.

## 2022-11-16 ENCOUNTER — Telehealth: Payer: Self-pay

## 2022-11-16 NOTE — Telephone Encounter (Signed)
     Patient  visit on 1/28  at [place]  Have you been able to follow up with your primary care physician? Yes   The patient was or was not able to obtain any needed medicine or equipment. Yes   Are there diet recommendations that you are having difficulty following? Na   Patient expresses understanding of discharge instructions and education provided has no other needs at this time.  Yes      [place] [phone] [address], [place], [place] [address] Phone: [phone] Email: [email or web address]

## 2022-11-16 NOTE — Telephone Encounter (Signed)
Notified April w/ MD response.Marland KitchenJohny Chess

## 2022-11-17 DIAGNOSIS — R790 Abnormal level of blood mineral: Secondary | ICD-10-CM | POA: Diagnosis not present

## 2022-11-17 DIAGNOSIS — C8330 Diffuse large B-cell lymphoma, unspecified site: Secondary | ICD-10-CM | POA: Diagnosis not present

## 2022-11-17 DIAGNOSIS — C8339 Diffuse large B-cell lymphoma, extranodal and solid organ sites: Secondary | ICD-10-CM | POA: Diagnosis not present

## 2022-11-19 ENCOUNTER — Telehealth: Payer: Self-pay | Admitting: Internal Medicine

## 2022-11-19 NOTE — Telephone Encounter (Signed)
[name] - Phone#  [phone]  Patient had a fall on 11/16/2022 in the kitchen backing up - controlled fall and did not injure himself.

## 2022-11-23 ENCOUNTER — Ambulatory Visit: Payer: Medicare PPO | Admitting: Internal Medicine

## 2022-11-23 ENCOUNTER — Encounter: Payer: Self-pay | Admitting: Internal Medicine

## 2022-11-23 VITALS — BP 110/62 | HR 91 | Temp 98.4°F | Ht 76.0 in | Wt 240.2 lb

## 2022-11-23 DIAGNOSIS — Z9181 History of falling: Secondary | ICD-10-CM | POA: Diagnosis not present

## 2022-11-23 DIAGNOSIS — C8338 Diffuse large B-cell lymphoma, lymph nodes of multiple sites: Secondary | ICD-10-CM | POA: Diagnosis not present

## 2022-11-23 DIAGNOSIS — I5022 Chronic systolic (congestive) heart failure: Secondary | ICD-10-CM

## 2022-11-23 DIAGNOSIS — N3000 Acute cystitis without hematuria: Secondary | ICD-10-CM | POA: Diagnosis not present

## 2022-11-23 DIAGNOSIS — F411 Generalized anxiety disorder: Secondary | ICD-10-CM

## 2022-11-23 NOTE — Assessment & Plan Note (Signed)
Using a walker at home

## 2022-11-23 NOTE — Assessment & Plan Note (Signed)
CHF seems to be compensated.  Continue with current meds

## 2022-11-23 NOTE — Assessment & Plan Note (Signed)
On Rx 

## 2022-11-23 NOTE — Assessment & Plan Note (Signed)
On chemo at [facility]

## 2022-11-23 NOTE — Assessment & Plan Note (Signed)
Recurrent UA if sx's Taking cranberry tabs

## 2022-11-23 NOTE — Progress Notes (Signed)
Subjective:  Patient ID: [name], male    DOB: 09-05-1946  Age: 77 y.o. MRN: [id number]  CC: No chief complaint on file.   HPI [name] presents for UTIs,  gait disorder/ falls, CHF Clinical events, tests that happened since [name]'s last visit were reviewed.  He is here with [name].  Outpatient Medications Prior to Visit  Medication Sig Dispense Refill   acetaminophen (TYLENOL) 325 MG tablet Take 2 tablets (650 mg total) by mouth every 6 (six) hours as needed for mild pain (or Fever >/= 101). 20 tablet    acyclovir (ZOVIRAX) 400 MG tablet Take 400 mg by mouth 2 (two) times daily.     albuterol (VENTOLIN HFA) 108 (90 Base) MCG/ACT inhaler Inhale 2 puffs into the lungs every 6 (six) hours as needed for wheezing or shortness of breath.     alfuzosin (UROXATRAL) 10 MG 24 hr tablet TAKE 1 TABLET AT BEDTIME 90 tablet 10   amoxicillin (AMOXIL) 500 MG capsule TAKE 4 CAPSULES BY MOUTH ONE HOUR PRIOR TO DENTAL APPOINTMENT/PROCEDURE.     atorvastatin (LIPITOR) 80 MG tablet TAKE 1 TABLET EVERY DAY (Patient taking differently: Take 80 mg by mouth at bedtime.) 90 tablet 1   buPROPion (WELLBUTRIN XL) 300 MG 24 hr tablet TAKE 1 TABLET EVERY DAY WITH BREAKFAST (Patient taking differently: Take 300 mg by mouth daily.) 90 tablet 1   Calcium Citrate-Vitamin D (CALCIUM CITRATE + D PO) Take 1 tablet by mouth 2 (two) times daily. 1246m of Calcium and 1000 units vitamin D3     Cholecalciferol (VITAMIN D) 50 MCG (2000 UT) tablet Take 4,000 Units by mouth in the morning and at bedtime.     clonazePAM (KLONOPIN) 1 MG tablet Take 1 tablet (1 mg total) by mouth 3 (three) times daily as needed for anxiety. 270 tablet 1   CRANBERRY PO Take 1 tablet by mouth in the morning and at bedtime.     denosumab (PROLIA) 60 MG/ML SOSY injection Inject 60 mg as directed every 6 (six) months.     diltiazem (TIAZAC) 240 MG 24 hr capsule 240 mg.     DULoxetine (CYMBALTA) 60 MG capsule TAKE 1 CAPSULE TWICE DAILY  (Patient taking differently: Take 60 mg by mouth 2 (two) times daily.) 180 capsule 1   EPINEPHrine 0.3 mg/0.3 mL IJ SOAJ injection Inject 0.3 mg into the muscle once as needed for anaphylaxis (severe allergic reaction).     Ergocalciferol 50 MCG (2000 UT) CAPS      ezetimibe (ZETIA) 10 MG tablet TAKE 1 TABLET EVERY DAY 90 tablet 1   feeding supplement (ENSURE ENLIVE / ENSURE PLUS) LIQD Take 237 mLs by mouth 2 (two) times daily between meals. (Patient taking differently: Take 237 mLs by mouth daily.) 237 mL 12   ferrous sulfate 325 (65 FE) MG tablet Take 325 mg by mouth 2 (two) times daily with a meal.     hydrochlorothiazide (HYDRODIURIL) 25 MG tablet Take 1 tablet by mouth daily.     isosorbide mononitrate (IMDUR) 30 MG 24 hr tablet Take 1 tablet by mouth daily.     levothyroxine (SYNTHROID) 125 MCG tablet Take 1 tablet (125 mcg total) by mouth daily at 6 (six) AM. 90 tablet 3   lidocaine-prilocaine (EMLA) cream Apply 1 Application topically as needed. (Patient taking differently: Apply 1 Application topically as needed (For port access/chemotherapy).) 30 g 0   loratadine (CLARITIN) 10 MG tablet Take 10 mg by mouth daily.  losartan (COZAAR) 50 MG tablet Oral for 90     Magnesium 250 MG TABS Take 250 mg by mouth daily.     Magnesium Oxide -Mg Supplement 250 MG TABS      metoprolol tartrate (LOPRESSOR) 25 MG tablet Take 25 mg by mouth 2 (two) times daily.     Multiple Vitamin (MULTIVITAMIN WITH MINERALS) TABS tablet Take 1 tablet by mouth every morning. Men's One-A-Day 50+     MYRBETRIQ 25 MG TB24 tablet TAKE 1 TABLET EVERY DAY (Patient taking differently: Take 25 mg by mouth at bedtime.) 90 tablet 3   nitroGLYCERIN (NITROSTAT) 0.4 MG SL tablet Place 0.4 mg under the tongue every 5 (five) minutes as needed for chest pain.     omeprazole (PRILOSEC) 40 MG capsule TAKE 1 CAPSULE EVERY DAY BEFORE BREAKFAST (Patient taking differently: Take 40 mg by mouth daily.) 90 capsule 1   Potassium Citrate 15  MEQ (1620 MG) TBCR Take 1 tablet by mouth See admin instructions. Take one tablet (15 meq) by mouth twice daily - with lunch and supper (Patient taking differently: Take 1 tablet by mouth in the morning and at bedtime.) 180 tablet 3   Probiotic Product (PROBIOTIC PO) Take 1 capsule by mouth daily with breakfast.     Specialty Vitamins Products (MG PLUS PROTEIN) 133 MG TABS Take 1 tablet by mouth 3 (three) times daily.     sulfamethoxazole-trimethoprim (BACTRIM DS) 800-160 MG tablet Take 1 tablet by mouth 3 (three) times a week.     tamsulosin (FLOMAX) 0.4 MG CAPS capsule Take 1 capsule by mouth daily.     testosterone enanthate (DELATESTRYL) 200 MG/ML injection INJECT 1ML (200MG) INTO THE MUSCLE EVERY 14 DAYS. DISCARD VIAL AFTER 28 DAYS. (Patient taking differently: Inject 200 mg into the muscle every 14 (fourteen) days. Every other Friday) 5 mL 3   vitamin C (ASCORBIC ACID) 500 MG tablet Take 500 mg by mouth daily with lunch.     Risankizumab-rzaa (SKYRIZI, 150 MG DOSE, Force) Inject 150 mg into the skin every 4 (four) months. (Patient not taking: Reported on 11/23/2022)     No facility-administered medications prior to visit.    ROS: Review of Systems  Constitutional:  Positive for fatigue. Negative for appetite change and unexpected weight change.  HENT:  Negative for congestion, nosebleeds, sneezing, sore throat and trouble swallowing.   Eyes:  Negative for itching and visual disturbance.  Respiratory:  Negative for cough.   Cardiovascular:  Positive for leg swelling. Negative for chest pain and palpitations.  Gastrointestinal:  Negative for abdominal distention, blood in stool, diarrhea and nausea.  Genitourinary:  Negative for frequency and hematuria.  Musculoskeletal:  Positive for back pain and gait problem. Negative for joint swelling and neck pain.  Skin:  Negative for rash.  Neurological:  Positive for weakness. Negative for dizziness, tremors and speech difficulty.  Hematological:   Bruises/bleeds easily.  Psychiatric/Behavioral:  Negative for agitation, decreased concentration, dysphoric mood, sleep disturbance and suicidal ideas. The patient is nervous/anxious.     Objective:  BP 110/62 (BP Location: Left Arm, Patient Position: Sitting, Cuff Size: Normal)   Pulse 91   Temp 98.4 F (36.9 C) (Oral)   Ht 6' 4"$  (1.93 m)   Wt 240 lb 3.2 oz (109 kg)   SpO2 96%   BMI 29.24 kg/m   BP Readings from Last 3 Encounters:  11/23/22 110/62  11/07/22 122/83  10/18/22 124/64    Wt Readings from Last 3 Encounters:  11/23/22 240 lb  3.2 oz (109 kg)  11/07/22 235 lb (106.6 kg)  10/18/22 229 lb 12.8 oz (104.2 kg)    Physical Exam Constitutional:      General: He is not in acute distress.    Appearance: He is well-developed. He is obese.     Comments: NAD  Eyes:     Conjunctiva/sclera: Conjunctivae normal.     Pupils: Pupils are equal, round, and reactive to light.  Neck:     Thyroid: No thyromegaly.     Vascular: No JVD.  Cardiovascular:     Rate and Rhythm: Normal rate and regular rhythm.     Heart sounds: Normal heart sounds. No murmur heard.    No friction rub. No gallop.  Pulmonary:     Effort: Pulmonary effort is normal. No respiratory distress.     Breath sounds: Normal breath sounds. No wheezing or rales.  Chest:     Chest wall: No tenderness.  Abdominal:     General: Bowel sounds are normal. There is no distension.     Palpations: Abdomen is soft. There is no mass.     Tenderness: There is no abdominal tenderness. There is no guarding or rebound.  Musculoskeletal:        General: No tenderness. Normal range of motion.     Cervical back: Normal range of motion.  Lymphadenopathy:     Cervical: No cervical adenopathy.  Skin:    General: Skin is warm and dry.     Findings: No rash.  Neurological:     Mental Status: He is alert and oriented to person, place, and time.     Cranial Nerves: No cranial nerve deficit.     Motor: No abnormal muscle tone.      Coordination: Coordination normal.     Gait: Gait normal.     Deep Tendon Reflexes: Reflexes are normal and symmetric.  Psychiatric:        Behavior: Behavior normal.        Thought Content: Thought content normal.        Judgment: Judgment normal.   The patient is using a walker No ankle edema He is alert, cooperative  Lab Results  Component Value Date   WBC 6.6 09/20/2022   HGB 12.0 (L) 09/20/2022   HCT 37.4 (L) 09/20/2022   PLT 129 (L) 09/20/2022   GLUCOSE 89 09/19/2022   CHOL 102 10/28/2021   TRIG 64 10/28/2021   HDL 45 10/28/2021   LDLCALC 43 10/28/2021   ALT 28 09/16/2022   AST 26 09/16/2022   NA 137 09/19/2022   K 4.2 09/19/2022   CL 105 09/19/2022   CREATININE 1.24 09/19/2022   BUN 34 (H) 09/19/2022   CO2 21 (L) 09/19/2022   TSH 14.031 (H) 09/06/2022   PSA 1.08 06/26/2020   INR 1.1 09/05/2022   HGBA1C 5.8 (H) 02/06/2013    CT Head Wo Contrast  Result Date: 11/07/2022 CLINICAL DATA:  Head trauma, minor (Age >= 65y) EXAM: CT HEAD WITHOUT CONTRAST TECHNIQUE: Contiguous axial images were obtained from the base of the skull through the vertex without intravenous contrast. RADIATION DOSE REDUCTION: This exam was performed according to the departmental dose-optimization program which includes automated exposure control, adjustment of the mA and/or kV according to patient size and/or use of iterative reconstruction technique. COMPARISON:  09/05/2022 FINDINGS: Brain: No evidence of acute infarction, hemorrhage, hydrocephalus, extra-axial collection or mass lesion/mass effect. Scattered low-density changes within the periventricular and subcortical white matter compatible with chronic microvascular ischemic  change. Mild diffuse cerebral volume loss. Vascular: Atherosclerotic calcifications involving the large vessels of the skull base. No unexpected hyperdense vessel. Skull: Normal. Negative for fracture or focal lesion. Sinuses/Orbits: Nearly resolved right mastoid  effusion. Other: None. IMPRESSION: 1. No acute intracranial abnormality. 2. Chronic microvascular ischemic change and cerebral volume loss. 3. Nearly resolved right mastoid effusion. Electronically Signed   By: Davina Poke D.O.   On: 11/07/2022 14:03    Assessment & Plan:   Problem List Items Addressed This Visit       Cardiovascular and Mediastinum   Chronic systolic CHF (congestive heart failure) ([place])    CHF seems to be compensated.  Continue with current meds      Relevant Medications   diltiazem (TIAZAC) 240 MG 24 hr capsule   hydrochlorothiazide (HYDRODIURIL) 25 MG tablet   isosorbide mononitrate (IMDUR) 30 MG 24 hr tablet   losartan (COZAAR) 50 MG tablet     Genitourinary   UTI (urinary tract infection)    Recurrent UA if sx's Taking cranberry tabs      Relevant Medications   sulfamethoxazole-trimethoprim (BACTRIM DS) 800-160 MG tablet   Other Relevant Orders   CULTURE, URINE COMPREHENSIVE   Urinalysis     Other   Generalized anxiety disorder - Primary    On Rx      Diffuse large B-cell lymphoma (HCC)    On chemo at [facility]      Relevant Medications   amoxicillin (AMOXIL) 500 MG capsule   sulfamethoxazole-trimethoprim (BACTRIM DS) 800-160 MG tablet   At high risk for falls    Using a walker at home         No orders of the defined types were placed in this encounter.     Follow-up: Return in about 6 months (around 05/24/2023) for a follow-up visit.  Walker Kehr, MD

## 2022-11-24 ENCOUNTER — Other Ambulatory Visit: Payer: Self-pay

## 2022-11-29 DIAGNOSIS — F410 Panic disorder [episodic paroxysmal anxiety] without agoraphobia: Secondary | ICD-10-CM | POA: Diagnosis not present

## 2022-11-29 DIAGNOSIS — F331 Major depressive disorder, recurrent, moderate: Secondary | ICD-10-CM | POA: Diagnosis not present

## 2022-12-01 DIAGNOSIS — C8330 Diffuse large B-cell lymphoma, unspecified site: Secondary | ICD-10-CM | POA: Diagnosis not present

## 2022-12-01 DIAGNOSIS — Z7969 Long term (current) use of other immunomodulators and immunosuppressants: Secondary | ICD-10-CM | POA: Diagnosis not present

## 2022-12-01 DIAGNOSIS — Z79899 Other long term (current) drug therapy: Secondary | ICD-10-CM | POA: Diagnosis not present

## 2022-12-02 NOTE — Telephone Encounter (Signed)
Entered RSV into immunization.Marland KitchenJohny Lane

## 2022-12-03 DIAGNOSIS — J301 Allergic rhinitis due to pollen: Secondary | ICD-10-CM | POA: Diagnosis not present

## 2022-12-03 DIAGNOSIS — J3089 Other allergic rhinitis: Secondary | ICD-10-CM | POA: Diagnosis not present

## 2022-12-03 DIAGNOSIS — J3081 Allergic rhinitis due to animal (cat) (dog) hair and dander: Secondary | ICD-10-CM | POA: Diagnosis not present

## 2022-12-08 DIAGNOSIS — J3081 Allergic rhinitis due to animal (cat) (dog) hair and dander: Secondary | ICD-10-CM | POA: Diagnosis not present

## 2022-12-08 DIAGNOSIS — J3089 Other allergic rhinitis: Secondary | ICD-10-CM | POA: Diagnosis not present

## 2022-12-08 DIAGNOSIS — J301 Allergic rhinitis due to pollen: Secondary | ICD-10-CM | POA: Diagnosis not present

## 2022-12-13 DIAGNOSIS — J3081 Allergic rhinitis due to animal (cat) (dog) hair and dander: Secondary | ICD-10-CM | POA: Diagnosis not present

## 2022-12-13 DIAGNOSIS — J301 Allergic rhinitis due to pollen: Secondary | ICD-10-CM | POA: Diagnosis not present

## 2022-12-13 DIAGNOSIS — J3089 Other allergic rhinitis: Secondary | ICD-10-CM | POA: Diagnosis not present

## 2022-12-15 DIAGNOSIS — Z9221 Personal history of antineoplastic chemotherapy: Secondary | ICD-10-CM | POA: Diagnosis not present

## 2022-12-15 DIAGNOSIS — Z5111 Encounter for antineoplastic chemotherapy: Secondary | ICD-10-CM | POA: Diagnosis not present

## 2022-12-15 DIAGNOSIS — C8330 Diffuse large B-cell lymphoma, unspecified site: Secondary | ICD-10-CM | POA: Diagnosis not present

## 2022-12-16 DIAGNOSIS — F411 Generalized anxiety disorder: Secondary | ICD-10-CM | POA: Diagnosis not present

## 2022-12-16 DIAGNOSIS — G4733 Obstructive sleep apnea (adult) (pediatric): Secondary | ICD-10-CM | POA: Diagnosis not present

## 2022-12-16 DIAGNOSIS — I4891 Unspecified atrial fibrillation: Secondary | ICD-10-CM | POA: Diagnosis not present

## 2022-12-16 DIAGNOSIS — M8589 Other specified disorders of bone density and structure, multiple sites: Secondary | ICD-10-CM | POA: Insufficient documentation

## 2022-12-16 DIAGNOSIS — E039 Hypothyroidism, unspecified: Secondary | ICD-10-CM | POA: Diagnosis not present

## 2022-12-16 DIAGNOSIS — F32A Depression, unspecified: Secondary | ICD-10-CM | POA: Diagnosis not present

## 2022-12-16 DIAGNOSIS — M810 Age-related osteoporosis without current pathological fracture: Secondary | ICD-10-CM | POA: Diagnosis not present

## 2022-12-16 DIAGNOSIS — Z5181 Encounter for therapeutic drug level monitoring: Secondary | ICD-10-CM | POA: Diagnosis not present

## 2022-12-16 DIAGNOSIS — Z8719 Personal history of other diseases of the digestive system: Secondary | ICD-10-CM | POA: Diagnosis not present

## 2022-12-16 DIAGNOSIS — C8590 Non-Hodgkin lymphoma, unspecified, unspecified site: Secondary | ICD-10-CM | POA: Diagnosis not present

## 2022-12-27 DIAGNOSIS — R972 Elevated prostate specific antigen [PSA]: Secondary | ICD-10-CM | POA: Diagnosis not present

## 2022-12-28 DIAGNOSIS — F331 Major depressive disorder, recurrent, moderate: Secondary | ICD-10-CM | POA: Diagnosis not present

## 2022-12-28 DIAGNOSIS — F419 Anxiety disorder, unspecified: Secondary | ICD-10-CM | POA: Diagnosis not present

## 2022-12-29 DIAGNOSIS — Z5111 Encounter for antineoplastic chemotherapy: Secondary | ICD-10-CM | POA: Diagnosis not present

## 2022-12-29 DIAGNOSIS — C8330 Diffuse large B-cell lymphoma, unspecified site: Secondary | ICD-10-CM | POA: Diagnosis not present

## 2022-12-29 DIAGNOSIS — Z9221 Personal history of antineoplastic chemotherapy: Secondary | ICD-10-CM | POA: Diagnosis not present

## 2023-01-03 DIAGNOSIS — R350 Frequency of micturition: Secondary | ICD-10-CM | POA: Diagnosis not present

## 2023-01-03 DIAGNOSIS — R3915 Urgency of urination: Secondary | ICD-10-CM | POA: Diagnosis not present

## 2023-01-06 ENCOUNTER — Telehealth: Payer: Self-pay

## 2023-01-06 NOTE — Telephone Encounter (Signed)
Contacted [name] to schedule their annual wellness visit. Appointment made for 01/11/23.    [facility], [place] ([facility])  [facility] [phone]

## 2023-01-11 ENCOUNTER — Ambulatory Visit (INDEPENDENT_AMBULATORY_CARE_PROVIDER_SITE_OTHER): Payer: Medicare PPO

## 2023-01-11 VITALS — Ht 76.0 in | Wt 240.0 lb

## 2023-01-11 DIAGNOSIS — Z0000 Encounter for general adult medical examination without abnormal findings: Secondary | ICD-10-CM

## 2023-01-11 NOTE — Progress Notes (Cosign Needed Addendum)
Subjective:   [name] is a 78 y.o. male who presents for Medicare Annual/Subsequent preventive examination.  I connected with  [name] on 01/11/23 by a audio enabled telemedicine application and verified that I am speaking with the correct person using two identifiers.  Patient Location: Home  Provider Location: Home Office  I discussed the limitations of evaluation and management by telemedicine. The patient expressed understanding and agreed to proceed.  Review of Systems     Cardiac Risk Factors include: advanced age (>74men, >43 women);dyslipidemia;hypertension;male gender;sedentary lifestyle     Objective:    Today's Vitals   01/11/23 1735  Weight: 240 lb (108.9 kg)  Height: 6\' 4"  (1.93 m)   Body mass index is 29.21 kg/m.     01/11/2023    5:39 PM 11/07/2022    1:13 PM 09/16/2022   11:00 PM 09/06/2022   11:00 PM 09/06/2022   12:17 AM 07/22/2022    1:00 AM 07/20/2022   11:30 PM  Advanced Directives  Does Patient Have a Medical Advance Directive? Yes Yes Yes  Yes Yes Yes  Type of Advance Directive Living will;Healthcare Power of Attorney Living will [place];Living will [place];Living will  Out of facility DNR (pink MOST or yellow form) [place];Living will;Out of facility DNR (pink MOST or yellow form)  Does patient want to make changes to medical advance directive? No - Patient declined  No - Patient declined No - Patient declined No - Patient declined No - Patient declined   Copy of [place] in Chart? No - copy requested  No - copy requested No - copy requested       Current Medications (verified) Outpatient Encounter Medications as of 01/11/2023  Medication Sig   acetaminophen (TYLENOL) 325 MG tablet Take 2 tablets (650 mg total) by mouth every 6 (six) hours as needed for mild pain (or Fever >/= 101).   acyclovir (ZOVIRAX) 400 MG tablet Take 400 mg by mouth 2 (two)  times daily.   albuterol (VENTOLIN HFA) 108 (90 Base) MCG/ACT inhaler Inhale 2 puffs into the lungs every 6 (six) hours as needed for wheezing or shortness of breath.   alfuzosin (UROXATRAL) 10 MG 24 hr tablet TAKE 1 TABLET AT BEDTIME   amoxicillin (AMOXIL) 500 MG capsule TAKE 4 CAPSULES BY MOUTH ONE HOUR PRIOR TO DENTAL APPOINTMENT/PROCEDURE.   atorvastatin (LIPITOR) 80 MG tablet TAKE 1 TABLET EVERY DAY (Patient taking differently: Take 80 mg by mouth at bedtime.)   buPROPion (WELLBUTRIN XL) 300 MG 24 hr tablet TAKE 1 TABLET EVERY DAY WITH BREAKFAST (Patient taking differently: Take 300 mg by mouth daily.)   Calcium Citrate-Vitamin D (CALCIUM CITRATE + D PO) Take 1 tablet by mouth 2 (two) times daily. 1200mg  of Calcium and 1000 units vitamin D3   Cholecalciferol (VITAMIN D) 50 MCG (2000 UT) tablet Take 4,000 Units by mouth in the morning and at bedtime.   clonazePAM (KLONOPIN) 1 MG tablet Take 1 tablet (1 mg total) by mouth 3 (three) times daily as needed for anxiety.   CRANBERRY PO Take 1 tablet by mouth in the morning and at bedtime.   denosumab (PROLIA) 60 MG/ML SOSY injection Inject 60 mg as directed every 6 (six) months.   diltiazem (TIAZAC) 240 MG 24 hr capsule 240 mg.   DULoxetine (CYMBALTA) 60 MG capsule TAKE 1 CAPSULE TWICE DAILY (Patient taking differently: Take 60 mg by mouth 2 (two) times daily.)  EPINEPHrine 0.3 mg/0.3 mL IJ SOAJ injection Inject 0.3 mg into the muscle once as needed for anaphylaxis (severe allergic reaction).   Ergocalciferol 50 MCG (2000 UT) CAPS    ezetimibe (ZETIA) 10 MG tablet TAKE 1 TABLET EVERY DAY   feeding supplement (ENSURE ENLIVE / ENSURE PLUS) LIQD Take 237 mLs by mouth 2 (two) times daily between meals. (Patient taking differently: Take 237 mLs by mouth daily.)   ferrous sulfate 325 (65 FE) MG tablet Take 325 mg by mouth 2 (two) times daily with a meal.   hydrochlorothiazide (HYDRODIURIL) 25 MG tablet Take 1 tablet by mouth daily.   isosorbide  mononitrate (IMDUR) 30 MG 24 hr tablet Take 1 tablet by mouth daily.   levothyroxine (SYNTHROID) 125 MCG tablet Take 1 tablet (125 mcg total) by mouth daily at 6 (six) AM.   lidocaine-prilocaine (EMLA) cream Apply 1 Application topically as needed. (Patient taking differently: Apply 1 Application topically as needed (For port access/chemotherapy).)   loratadine (CLARITIN) 10 MG tablet Take 10 mg by mouth daily.   losartan (COZAAR) 50 MG tablet Oral for 90   Magnesium 250 MG TABS Take 250 mg by mouth daily.   Magnesium Oxide -Mg Supplement 250 MG TABS    metoprolol tartrate (LOPRESSOR) 25 MG tablet Take 25 mg by mouth 2 (two) times daily.   Multiple Vitamin (MULTIVITAMIN WITH MINERALS) TABS tablet Take 1 tablet by mouth every morning. Men's One-A-Day 50+   MYRBETRIQ 25 MG TB24 tablet TAKE 1 TABLET EVERY DAY (Patient taking differently: Take 25 mg by mouth at bedtime.)   nitroGLYCERIN (NITROSTAT) 0.4 MG SL tablet Place 0.4 mg under the tongue every 5 (five) minutes as needed for chest pain.   omeprazole (PRILOSEC) 40 MG capsule TAKE 1 CAPSULE EVERY DAY BEFORE BREAKFAST (Patient taking differently: Take 40 mg by mouth daily.)   Potassium Citrate 15 MEQ (1620 MG) TBCR Take 1 tablet by mouth See admin instructions. Take one tablet (15 meq) by mouth twice daily - with lunch and supper (Patient taking differently: Take 1 tablet by mouth in the morning and at bedtime.)   Probiotic Product (PROBIOTIC PO) Take 1 capsule by mouth daily with breakfast.   Risankizumab-rzaa (SKYRIZI, 150 MG DOSE, [place]) Inject 150 mg into the skin every 4 (four) months.   Specialty Vitamins Products (MG PLUS PROTEIN) 133 MG TABS Take 1 tablet by mouth 3 (three) times daily.   sulfamethoxazole-trimethoprim (BACTRIM DS) 800-160 MG tablet Take 1 tablet by mouth 3 (three) times a week.   tamsulosin (FLOMAX) 0.4 MG CAPS capsule Take 1 capsule by mouth daily.   testosterone enanthate (DELATESTRYL) 200 MG/ML injection INJECT 1ML (200MG )  INTO THE MUSCLE EVERY 14 DAYS. DISCARD VIAL AFTER 28 DAYS. (Patient taking differently: Inject 200 mg into the muscle every 14 (fourteen) days. Every other Friday)   vitamin C (ASCORBIC ACID) 500 MG tablet Take 500 mg by mouth daily with lunch.   No facility-administered encounter medications on file as of 01/11/2023.    Allergies (verified) Short ragweed pollen ext   History: Past Medical History:  Diagnosis Date   Benign localized prostatic hyperplasia with lower urinary tract symptoms (LUTS)    CAD (coronary artery disease) cardiologist--  dr t. Oval Linsey   hx of known CAD obstruction w/ collaterals (cath done @ [facility] 12-31-2011) ;   last cardiac cath 08-13-2019  showed sig. 2V CAD involving proxLAD and CTO of the RCA/  chronic total occlusion midRCA w/ bridging and L>R collaterals   Diastolic CHF  dx 06-15-2019 hospital admission (followed by pcp)   Diffuse large B cell lymphoma oncologist-- dr Irene Limbo--- in remission   dx 08/ 2019 -- bx left tonsill mass-- involving lymph nodes--- completed chemotherapy 10-12-2018   DJD (degenerative joint disease)    Dysthymic disorder    Environmental allergies    GAD (generalized anxiety disorder)    with dysthymia   GERD (gastroesophageal reflux disease)    History of falling    multiple times   History of kidney stones    HLD (hyperlipidemia)    Hyperlipidemia    Hypertension    Hypogonadism in male    OA (osteoarthritis)    OSA on CPAP    cpap machine-settings 17   Osteoporosis with fracture 04/24/2013   Persistent atrial fibrillation    On Xarelto, cardiologist--- dr Alfonso Patten. [place]   Plaque psoriasis    dermatologist--- dr Harriett Sine--- currently taking otezla   Post-surgical hypothyroidism    followed by pcp---  s/p total thyroidectomy for graves disease in 1987   Wears hearing aid in both ears    Past Surgical History:  Procedure Laterality Date   APPENDECTOMY  2011 approx.    CARDIAC CATHETERIZATION  12-31-2011   dr j.  Beatrix Fetters @[facility]    normal LVF w/ multivessel CAD,  occluded RCA w/ colleterals   CATARACT EXTRACTION W/ INTRAOCULAR LENS  IMPLANT, BILATERAL  2016 approx.   COLONOSCOPY     CORONARY PRESSURE/FFR STUDY N/A 08/13/2019   Procedure: INTRAVASCULAR PRESSURE WIRE/FFR STUDY;  Surgeon: Nelva Bush, MD;  Location: [place] [facility];  Service: Cardiovascular;  Laterality: N/A;   CYSTOSCOPY WITH RETROGRADE PYELOGRAM, URETEROSCOPY AND STENT PLACEMENT Left 11/07/2017   Procedure: CYSTOSCOPY WITH RETROGRADE PYELOGRAM, URETEROSCOPY AND STENT PLACEMENT;  Surgeon: Cleon Gustin, MD;  [facility];  Service: Urology;  Laterality: Left;   CYSTOSCOPY WITH RETROGRADE PYELOGRAM, URETEROSCOPY AND STENT PLACEMENT Right 10/22/2019   Procedure: CYSTOSCOPY WITH RETROGRADE PYELOGRAM, URETEROSCOPY AND STENT PLACEMENT;  Surgeon: Cleon Gustin, MD;  Location: [facility];  Service: Urology;  Laterality: Right;   CYSTOSCOPY/URETEROSCOPY/HOLMIUM LASER/STENT PLACEMENT Right 03/11/2017   Procedure: CYSTOSCOPY/URETEROSCOPYSTENT PLACEMENT right ureter retrograde pylegram;  Surgeon: Cleon Gustin, MD;  [facility];  Service: Urology;  Laterality: Right;   HARDWARE REMOVAL  10/05/2012   Procedure: HARDWARE REMOVAL;  Surgeon: [name], MD;  [facility];  Service: Orthopedics;  Laterality: Right;  REMOVING  STRYKER  GAMMA NAIL   HARDWARE REMOVAL Right 07/03/2013   Procedure: HARDWARE REMOVAL RIGHT TIBIA ;  Surgeon: Rozanna Box, MD;  Location: [place];  Service: Orthopedics;  Laterality: Right;   HIP CLOSED REDUCTION Right 10/15/2013   Procedure: CLOSED MANIPULATION HIP;  Surgeon: [name], MD;  [facility];  Service: Orthopedics;  Laterality: Right;   HOLMIUM LASER APPLICATION Right [id number]   Procedure: HOLMIUM LASER APPLICATION;  Surgeon: Cleon Gustin, MD;  [facility];  Service: Urology;  Laterality: Right;   HOLMIUM LASER APPLICATION Left [id number]   Procedure:  HOLMIUM LASER APPLICATION;  Surgeon: Cleon Gustin, MD;  [facility];  Service: Urology;  Laterality: Left;   HOLMIUM LASER APPLICATION Left [id number]   Procedure: HOLMIUM LASER APPLICATION;  Surgeon: Cleon Gustin, MD;  [facility];  Service: Urology;  Laterality: Left;   HOLMIUM LASER APPLICATION Right [id number]   Procedure: HOLMIUM LASER APPLICATION;  Surgeon: Cleon Gustin, MD;  Location: [facility];  Service: Urology;  Laterality: Right;  INCISION AND DRAINAGE HIP Right 11/16/2013   Procedure: IRRIGATION AND DEBRIDEMENT RIGHT HIP;  Surgeon: [name], MD;  [facility];  Service: Orthopedics;  Laterality: Right;   IR IMAGING GUIDED PORT INSERTION  06/22/2018   IR NEPHROSTOMY PLACEMENT LEFT  03/28/2019   IR URETERAL STENT LEFT NEW ACCESS W/O SEP NEPHROSTOMY CATH  10/24/2017   IR URETERAL STENT LEFT NEW ACCESS W/O SEP NEPHROSTOMY CATH  03/26/2019   NEPHROLITHOTOMY Right 02/08/2017   Procedure: NEPHROLITHOTOMY PERCUTANEOUS WITH SURGEON ACCESS;  Surgeon: Cleon Gustin, MD;  [facility];  Service: Urology;  Laterality: Right;   NEPHROLITHOTOMY Left 10/24/2017   Procedure: NEPHROLITHOTOMY PERCUTANEOUS;  Surgeon: Cleon Gustin, MD;  [facility];  Service: Urology;  Laterality: Left;   NEPHROLITHOTOMY Left 03/26/2019   Procedure: NEPHROLITHOTOMY PERCUTANEOUS;  Surgeon: Cleon Gustin, MD;  [facility];  Service: Urology;  Laterality: Left;  2 HRS   NEPHROLITHOTOMY Left 04/17/2019   Procedure: NEPHROLITHOTOMY PERCUTANEOUS;  Surgeon: Cleon Gustin, MD;  [facility];  Service: Urology;  Laterality: Left;  2 HRS   ORIF TIBIA FRACTURE Right 02/06/2013   Procedure: OPEN REDUCTION INTERNAL FIXATION (ORIF) TIBIA FRACTURE WITH IM ROD FIBULA;  Surgeon: Rozanna Box, MD;  Location: [place];  Service: Orthopedics;  Laterality: Right;   ORIF TIBIA FRACTURE Right 07/03/2013   Procedure: RIGHT TIBIA NON UNION REPAIR ;  Surgeon:  Rozanna Box, MD;  Location: [place];  Service: Orthopedics;  Laterality: Right;   ORIF WRIST FRACTURE  10/02/2012   Procedure: OPEN REDUCTION INTERNAL FIXATION (ORIF) WRIST FRACTURE;  Surgeon: Roseanne Kaufman, MD;  [facility];  Service: Orthopedics;  Laterality: Right;  WITH   ANTIBIOTIC  CEMENT   ORIF WRIST FRACTURE Left 10/28/2013   Procedure: OPEN REDUCTION INTERNAL FIXATION (ORIF) WRIST FRACTURE with allograft;  Surgeon: Roseanne Kaufman, MD;  [facility];  Service: Orthopedics;  Laterality: Left;  DVR Plate   QUADRICEPS TENDON REPAIR Left 07/15/2017   Procedure: REPAIR QUADRICEP TENDON;  Surgeon: Frederik Pear, MD;  Location: [place];  Service: Orthopedics;  Laterality: Left;   RIGHT/LEFT HEART CATH AND CORONARY ANGIOGRAPHY N/A 08/13/2019   Procedure: RIGHT/LEFT HEART CATH AND CORONARY ANGIOGRAPHY;  Surgeon: Nelva Bush, MD;  Location: [place] [facility];  Service: Cardiovascular;  Laterality: N/A;   THYROIDECTOMY  02/1986   TOTAL HIP ARTHROPLASTY Right 03-16-2016   @[facility]    TOTAL HIP REVISION  10/05/2012   Procedure: TOTAL HIP REVISION;  Surgeon: [name], MD;  [facility];  Service: Orthopedics;  Laterality: Right;  RIGHT TOTAL HIP REVISION   TOTAL HIP REVISION Right 09/17/2013   Procedure: REVISION RIGHT TOTAL HIP ARTHROPLASTY ;  Surgeon: [name], MD;  [facility];  Service: Orthopedics;  Laterality: Right;   TOTAL HIP REVISION Right 10/26/2013   Procedure: REVISION RIGHT TOTAL HIP ARTHROPLASTY;  Surgeon: [name], MD;  [facility];  Service: Orthopedics;  Laterality: Right;   TOTAL KNEE ARTHROPLASTY Bilateral right 03-15-2011;  left 06-30-2011   TOTAL KNEE REVISION Left 04/11/2017   Procedure: TOTAL KNEE REVISION PATELLA and TIBIA;  Surgeon: Frederik Pear, MD;  Location: [place];  Service: Orthopedics;  Laterality: Left;   Family History  Problem Relation Age of Onset   CAD Father 90   Asthma Father    Alcohol abuse Father    Arthritis Mother     Alcohol abuse Sister    Social History   Socioeconomic History  Marital status: Married    Spouse name: Not on file   Number of children: Not on file   Years of education: Not on file   Highest education level: Not on file  Occupational History   Not on file  Tobacco Use   Smoking status: Former    Packs/day: 1.00    Years: 50.00    Additional pack years: 0.00    Total pack years: 50.00    Types: Cigarettes    Quit date: 01/12/2012    Years since quitting: 11.0   Smokeless tobacco: Never  Vaping Use   Vaping Use: Never used  Substance and Sexual Activity   Alcohol use: Yes    Comment: rarely   Drug use: Never   Sexual activity: Yes  Other Topics Concern   Not on file  Social History Narrative   [place] 2.5 months, went home Feb 21st slipped and fell on back and developed.  Home PT/OT.  Just started outpatient physical therapy.  Friday night, misstepped.       Pt spouse verbalized pt had fallen 4 times within the last 24 hours     Social Determinants of Health   Financial Resource Strain: Low Risk  (01/11/2023)   Overall Financial Resource Strain (CARDIA)    Difficulty of Paying Living Expenses: Not hard at all  Food Insecurity: No Food Insecurity (01/11/2023)   Hunger Vital Sign    Worried About Running Out of Food in the Last Year: Never true    Ran Out of Food in the Last Year: Never true  Transportation Needs: No Transportation Needs (01/11/2023)   PRAPARE - Hydrologist (Medical): No    Lack of Transportation (Non-Medical): No  Physical Activity: Inactive (01/11/2023)   Exercise Vital Sign    Days of Exercise per Week: 0 days    Minutes of Exercise per Session: 0 min  Stress: Stress Concern Present (01/11/2023)   [place]    Feeling of Stress : To some extent  Social Connections: Moderately Isolated (01/11/2023)   Social Connection and Isolation Panel [NHANES]    Frequency  of Communication with Friends and Family: Once a week    Frequency of Social Gatherings with Friends and Family: Never    Attends Religious Services: 1 to 4 times per year    Active Member of [employer] or Organizations: No    Attends Music therapist: Never    Marital Status: Married    Tobacco Counseling Counseling given: Not Answered   Clinical Intake:  Pre-visit preparation completed: Yes  Pain : No/denies pain  Diabetes: No  How often do you need to have someone help you when you read instructions, pamphlets, or other written materials from your doctor or pharmacy?: 1 - Never  Diabetic?No   Interpreter Needed?: No  Comments: Assisted with visit by spouse [name] Information entered by :: Denman George LPN   Activities of Daily Living    01/11/2023    5:39 PM 01/06/2023    4:57 PM  In your present state of health, do you have any difficulty performing the following activities:  Hearing? 1 1  Vision? 0 0  Difficulty concentrating or making decisions? 0 0  Walking or climbing stairs? 1 1  Dressing or bathing? 1 1  Doing errands, shopping? 1 1  Preparing Food and eating ? N N  Using the Toilet? N N  In the past six months, have  you accidently leaked urine? Y Y  Do you have problems with loss of bowel control? N N  Managing your Medications? N N  Managing your Finances? N N  Housekeeping or managing your Housekeeping? Tempie Donning    Patient Care Team: Cassandria Anger, MD as PCP - General (Internal Medicine) Skeet Latch, MD as PCP - Cardiology (Cardiology) Brunetta Genera, MD as Consulting Physician (Hematology) McKenzie, Candee Furbish, MD as Consulting Physician (Urology) Jackquline Denmark, MD as Consulting Physician (Gastroenterology) Skeet Latch, MD as Attending Physician (Cardiology) Lynnell Dike, OD as Consulting Physician (Optometry) Szabat, Darnelle Maffucci, [facility] (Inactive) as Pharmacist (Pharmacist) Harriett Sine, MD as Consulting Physician  (Dermatology)  Indicate any recent Medical Services you may have received from other than [facility] providers in the past year (date may be approximate).     Assessment:   This is a routine wellness examination for [place].  Hearing/Vision screen Hearing Screening - Comments:: Denies hearing difficulties  Vision Screening - Comments:: Wears rx glasses - up to date with routine eye exams with Dr. Renaldo Fiddler    Dietary issues and exercise activities discussed: Current Exercise Habits: The patient does not participate in regular exercise at present   Goals Addressed   None   Depression Screen    01/11/2023    5:37 PM 11/23/2022   11:36 AM 08/23/2022   11:38 AM 06/30/2022    1:53 PM 06/02/2022    1:55 PM 05/24/2022    1:56 PM 05/20/2022    3:25 PM  PHQ 2/9 Scores  PHQ - 2 Score 0 0 0 0 1 0 0  PHQ- 9 Score   0    0    Fall Risk    01/11/2023    5:36 PM 01/06/2023    4:57 PM 11/23/2022   11:35 AM 08/31/2022   10:53 AM 08/23/2022   11:37 AM  Fall Risk   Falls in the past year? 1 1 1 1 1   Number falls in past yr: 1 1 0 1 1  Injury with Fall? 1 1 0 0 0  Comment   SCALP SLIT open    Risk for fall due to : History of fall(s);Impaired balance/gait;Impaired mobility  No Fall Risks History of fall(s);Impaired balance/gait;Impaired mobility History of fall(s)  Follow up Falls prevention discussed;Education provided;Falls evaluation completed  Falls evaluation completed Falls evaluation completed Falls evaluation completed    FALL RISK PREVENTION PERTAINING TO THE HOME:  Any stairs in or around the home? Yes  If so, are there any without handrails? No  Home free of loose throw rugs in walkways, pet beds, electrical cords, etc? Yes  Adequate lighting in your home to reduce risk of falls? Yes   ASSISTIVE DEVICES UTILIZED TO PREVENT FALLS:  Life alert? No  Use of a cane, walker or w/c? Yes  Grab bars in the bathroom? Yes  Shower chair or bench in shower? No  Elevated toilet seat or a  handicapped toilet? Yes   TIMED UP AND GO:  Was the test performed? No . Telephonic visit   Cognitive Function:        01/11/2023    5:39 PM  6CIT Screen  What Year? 0 points  What month? 0 points  What time? 0 points  Count back from 20 0 points  Months in reverse 0 points  Repeat phrase 0 points  Total Score 0 points    Immunizations Immunization History  Administered Date(s) Administered   Fluad Quad(high Dose 65+) 06/26/2020, 07/08/2021  Influenza Split 07/05/2013   Influenza, High Dose Seasonal PF 07/12/2015, 07/29/2015, 04/28/2017, 08/16/2017, 09/06/2018   Influenza,inj,Quad PF,6+ Mos 07/05/2013, 07/17/2018   Influenza-Unspecified 07/07/2022   PFIZER(Purple Top)SARS-COV-2 Vaccination 12/04/2019, 12/25/2019, 06/05/2020, 01/23/2021, 08/06/2021, 07/01/2022   PPD Test 03/19/2016, 11/05/2016, 11/18/2016   Pneumococcal Conjugate-13 12/03/2015   Pneumococcal Polysaccharide-23 10/11/2009, 08/07/2015, 04/28/2017, 08/16/2017   Respiratory Syncytial Virus Vaccine,Recomb Aduvanted(Arexvy) 11/18/2022   Td 10/11/2012   Td (Adult),5 Lf Tetanus Toxid, Preservative Free 10/11/2012   Tdap 06/17/2022    TDAP status: Up to date  Flu Vaccine status: Up to date  Pneumococcal vaccine status: Up to date  Covid-19 vaccine status: Information provided on how to obtain vaccines.   Qualifies for Shingles Vaccine? Yes   Zostavax completed No   Shingrix Completed?: No.    Education has been provided regarding the importance of this vaccine. Patient has been advised to call insurance company to determine out of pocket expense if they have not yet received this vaccine. Advised may also receive vaccine at local pharmacy or Health Dept. Verbalized acceptance and understanding.  Screening Tests Health Maintenance  Topic Date Due   COVID-19 Vaccine (7 - 2023-24 season) 08/26/2022   Lung Cancer Screening  04/23/2023   INFLUENZA VACCINE  05/12/2023   Medicare Annual Wellness (AWV)   01/11/2024   DTaP/Tdap/Td (4 - Td or Tdap) 06/17/2032   Pneumonia Vaccine 28+ Years old  Completed   Hepatitis C Screening  Completed   HPV VACCINES  Aged Out   Zoster Vaccines- Shingrix  Discontinued    Health Maintenance  Health Maintenance Due  Topic Date Due   COVID-19 Vaccine (7 - 2023-24 season) 08/26/2022    Colorectal cancer screening: No longer required.   Lung Cancer Screening: (Low Dose CT Chest recommended if Age 24-80 years, 30 pack-year currently smoking OR have quit w/in 15years.) does qualify.   Lung Cancer Screening Referral: followed by oncology  Additional Screening:  Hepatitis C Screening: does qualify; Completed 06/07/18  Vision Screening: Recommended annual ophthalmology exams for early detection of glaucoma and other disorders of the eye. Is the patient up to date with their annual eye exam?  Yes  Who is the provider or what is the name of the office in which the patient attends annual eye exams? Dr. Renaldo Fiddler  If pt is not established with a provider, would they like to be referred to a provider to establish care? No .   Dental Screening: Recommended annual dental exams for proper oral hygiene  Community Resource Referral / Chronic Care Management: CRR required this visit?  No   CCM required this visit?  No      Plan:     I have personally reviewed and noted the following in the patient's chart:   Medical and social history Use of alcohol, tobacco or illicit drugs  Current medications and supplements including opioid prescriptions. Patient is not currently taking opioid prescriptions. Functional ability and status Nutritional status Physical activity Advanced directives List of other physicians Hospitalizations, surgeries, and ER visits in previous 12 months Vitals Screenings to include cognitive, depression, and falls Referrals and appointments  In addition, I have reviewed and discussed with patient certain preventive protocols, quality  metrics, and best practice recommendations. A written personalized care plan for preventive services as well as general preventive health recommendations were provided to patient.     Vanetta Mulders, [place]   [id number]   Due to this being a virtual visit, the after visit summary with patients personalized plan  was offered to patient via mail or my-chart.  Patient would like to access on my-chart  Nurse Notes: No concerns    Medical screening examination/treatment/procedure(s) were performed by non-physician practitioner and as supervising physician I was immediately available for consultation/collaboration.  I agree with above. Jacinta Shoe, MD

## 2023-01-11 NOTE — Patient Instructions (Signed)
[name] , Thank you for taking time to come for your Medicare Wellness Visit. I appreciate your ongoing commitment to your health goals. Please review the following plan we discussed and let me know if I can assist you in the future.   These are the goals we discussed:  Goals       Manage My Medicine      Timeframe:  Long-Range Goal Priority:  Medium Start Date:      01/13/2022                       Expected End Date:    01/14/2023                   Follow Up Date 07/2022   - call for medicine refill 2 or 3 days before it runs out - call if I am sick and can't take my medicine - keep a list of all the medicines I take; vitamins and herbals too - learn to read medicine labels - use a pillbox to sort medicine    Why is this important?   These steps will help you keep on track with your medicines.   Notes:       Patient has support from spouse. Patient has weekly nurse visits at present.      Care Coordination Interventions:  Active listening / Reflection utilized  Described Care Coordination program support with [name], spouse of client Reviewed care needs of client. [name] said client has nurse who visits client weekly at this time.  [name] helps transport client to and from client medical appointments Reviewed medication procurement of client [name] was glad to hear of this program, will share information on program with client, and said program may be something that client  could benefit from in the future       Patient Stated (pt-stated)      My goal is continue to walk to strengthen my legs and get back to being active.      Patient Stated (pt-stated)      Continue to monitor weight and stay physically active.      Prevent Falls and Broken Bones-Osteoporosis      Timeframe:  Long-Range Goal Priority:  High Start Date:  07/15/2021                          Expected End Date: 01/13/2022                      Follow Up Date 01/13/2022   - always use  handrails on the stairs - always wear shoes or slippers with non-slip sole - get at least 10 minutes of activity every day - keep cell phone with me always - make an emergency alert plan in case I fall - pick up clutter from the floors - remove, or use a non-slip pad, with my throw rugs - use a nightlight in the bathroom - wear low heeled or flat shoes with non-skid soles    Why is this important?   When you fall, there are 3 things that control if a bone breaks or not.  These are the fall itself, how hard and the direction that you fall and how fragile your bones are.  Preventing falls is very important for you because of fragile bones.        Track and Manage My Blood Pressure / Heart  Rate-Hypertension/CHF/Afib      Timeframe:  Long-Range Goal Priority:  High Start Date:  07/15/2021                          Expected End Date: 01/13/2022                      Follow Up Date 01/13/2022   - check blood pressure weekly - choose a place to take my blood pressure (home, clinic or office, retail store) - write blood pressure results in a log or diary    Why is this important?   You won't feel high blood pressure, but it can still hurt your blood vessels.  High blood pressure can cause heart or kidney problems. It can also cause a stroke.  Making lifestyle changes like losing a little weight or eating less salt will help.  Checking your blood pressure at home and at different times of the day can help to control blood pressure.  If the doctor prescribes medicine remember to take it the way the doctor ordered.  Call the office if you cannot afford the medicine or if there are questions about it.          This is a list of the screening recommended for you and due dates:  Health Maintenance  Topic Date Due   COVID-19 Vaccine (7 - 2023-24 season) 08/26/2022   Screening for Lung Cancer  04/23/2023   Flu Shot  05/12/2023   Medicare Annual Wellness Visit  01/11/2024   DTaP/Tdap/Td vaccine  (4 - Td or Tdap) 06/17/2032   Pneumonia Vaccine  Completed   Hepatitis C Screening: USPSTF Recommendation to screen - Ages 64-79 yo.  Completed   HPV Vaccine  Aged Out   Zoster (Shingles) Vaccine  Discontinued    Advanced directives: Please bring a copy of your health care power of attorney and living will to the office to be added to your chart at your convenience.   Conditions/risks identified: Aim for 30 minutes of exercise or brisk walking, 6-8 glasses of water, and 5 servings of fruits and vegetables each day.   Next appointment: Follow up in one year for your annual wellness visit.   Preventive Care 8 Years and Older, Male  Preventive care refers to lifestyle choices and visits with your health care provider that can promote health and wellness. What does preventive care include? A yearly physical exam. This is also called an annual well check. Dental exams once or twice a year. Routine eye exams. Ask your health care provider how often you should have your eyes checked. Personal lifestyle choices, including: Daily care of your teeth and gums. Regular physical activity. Eating a healthy diet. Avoiding tobacco and drug use. Limiting alcohol use. Practicing safe sex. Taking low doses of aspirin every day. Taking vitamin and mineral supplements as recommended by your health care provider. What happens during an annual well check? The services and screenings done by your health care provider during your annual well check will depend on your age, overall health, lifestyle risk factors, and family history of disease. Counseling  Your health care provider may ask you questions about your: Alcohol use. Tobacco use. Drug use. Emotional well-being. Home and relationship well-being. Sexual activity. Eating habits. History of falls. Memory and ability to understand (cognition). Work and work Statistician. Screening  You may have the following tests or measurements: Height,  weight, and BMI. Blood pressure. Lipid and  cholesterol levels. These may be checked every 5 years, or more frequently if you are over 20 years old. Skin check. Lung cancer screening. You may have this screening every year starting at age 54 if you have a 30-pack-year history of smoking and currently smoke or have quit within the past 15 years. Fecal occult blood test (FOBT) of the stool. You may have this test every year starting at age 39. Flexible sigmoidoscopy or colonoscopy. You may have a sigmoidoscopy every 5 years or a colonoscopy every 10 years starting at age 73. Prostate cancer screening. Recommendations will vary depending on your family history and other risks. Hepatitis C blood test. Hepatitis B blood test. Sexually transmitted disease (STD) testing. Diabetes screening. This is done by checking your blood sugar (glucose) after you have not eaten for a while (fasting). You may have this done every 1-3 years. Abdominal aortic aneurysm (AAA) screening. You may need this if you are a current or former smoker. Osteoporosis. You may be screened starting at age 54 if you are at high risk. Talk with your health care provider about your test results, treatment options, and if necessary, the need for more tests. Vaccines  Your health care provider may recommend certain vaccines, such as: Influenza vaccine. This is recommended every year. Tetanus, diphtheria, and acellular pertussis (Tdap, Td) vaccine. You may need a Td booster every 10 years. Zoster vaccine. You may need this after age 51. Pneumococcal 13-valent conjugate (PCV13) vaccine. One dose is recommended after age 7. Pneumococcal polysaccharide (PPSV23) vaccine. One dose is recommended after age 40. Talk to your health care provider about which screenings and vaccines you need and how often you need them. This information is not intended to replace advice given to you by your health care provider. Make sure you discuss any  questions you have with your health care provider. Document Released: 10/24/2015 Document Revised: 06/16/2016 Document Reviewed: 07/29/2015 Elsevier Interactive Patient Education  2017 [employer] Prevention in the Home Falls can cause injuries. They can happen to people of all ages. There are many things you can do to make your home safe and to help prevent falls. What can I do on the outside of my home? Regularly fix the edges of walkways and driveways and fix any cracks. Remove anything that might make you trip as you walk through a door, such as a raised step or threshold. Trim any bushes or trees on the path to your home. Use bright outdoor lighting. Clear any walking paths of anything that might make someone trip, such as rocks or tools. Regularly check to see if handrails are loose or broken. Make sure that both sides of any steps have handrails. Any raised decks and porches should have guardrails on the edges. Have any leaves, snow, or ice cleared regularly. Use sand or salt on walking paths during winter. Clean up any spills in your garage right away. This includes oil or grease spills. What can I do in the bathroom? Use night lights. Install grab bars by the toilet and in the tub and shower. Do not use towel bars as grab bars. Use non-skid mats or decals in the tub or shower. If you need to sit down in the shower, use a plastic, non-slip stool. Keep the floor dry. Clean up any water that spills on the floor as soon as it happens. Remove soap buildup in the tub or shower regularly. Attach bath mats securely with double-sided non-slip rug tape. Do not have  throw rugs and other things on the floor that can make you trip. What can I do in the bedroom? Use night lights. Make sure that you have a light by your bed that is easy to reach. Do not use any sheets or blankets that are too big for your bed. They should not hang down onto the floor. Have a firm chair that has side  arms. You can use this for support while you get dressed. Do not have throw rugs and other things on the floor that can make you trip. What can I do in the kitchen? Clean up any spills right away. Avoid walking on wet floors. Keep items that you use a lot in easy-to-reach places. If you need to reach something above you, use a strong step stool that has a grab bar. Keep electrical cords out of the way. Do not use floor polish or wax that makes floors slippery. If you must use wax, use non-skid floor wax. Do not have throw rugs and other things on the floor that can make you trip. What can I do with my stairs? Do not leave any items on the stairs. Make sure that there are handrails on both sides of the stairs and use them. Fix handrails that are broken or loose. Make sure that handrails are as long as the stairways. Check any carpeting to make sure that it is firmly attached to the stairs. Fix any carpet that is loose or worn. Avoid having throw rugs at the top or bottom of the stairs. If you do have throw rugs, attach them to the floor with carpet tape. Make sure that you have a light switch at the top of the stairs and the bottom of the stairs. If you do not have them, ask someone to add them for you. What else can I do to help prevent falls? Wear shoes that: Do not have high heels. Have rubber bottoms. Are comfortable and fit you well. Are closed at the toe. Do not wear sandals. If you use a stepladder: Make sure that it is fully opened. Do not climb a closed stepladder. Make sure that both sides of the stepladder are locked into place. Ask someone to hold it for you, if possible. Clearly mark and make sure that you can see: Any grab bars or handrails. First and last steps. Where the edge of each step is. Use tools that help you move around (mobility aids) if they are needed. These include: Canes. Walkers. Scooters. Crutches. Turn on the lights when you go into a dark area.  Replace any light bulbs as soon as they burn out. Set up your furniture so you have a clear path. Avoid moving your furniture around. If any of your floors are uneven, fix them. If there are any pets around you, be aware of where they are. Review your medicines with your doctor. Some medicines can make you feel dizzy. This can increase your chance of falling. Ask your doctor what other things that you can do to help prevent falls. This information is not intended to replace advice given to you by your health care provider. Make sure you discuss any questions you have with your health care provider. Document Released: 07/24/2009 Document Revised: 03/04/2016 Document Reviewed: 11/01/2014 Elsevier Interactive Patient Education  2017 [employer].

## 2023-01-12 DIAGNOSIS — C8330 Diffuse large B-cell lymphoma, unspecified site: Secondary | ICD-10-CM | POA: Diagnosis not present

## 2023-01-12 DIAGNOSIS — C8590 Non-Hodgkin lymphoma, unspecified, unspecified site: Secondary | ICD-10-CM | POA: Diagnosis not present

## 2023-01-12 DIAGNOSIS — Z5111 Encounter for antineoplastic chemotherapy: Secondary | ICD-10-CM | POA: Diagnosis not present

## 2023-01-12 DIAGNOSIS — Z9221 Personal history of antineoplastic chemotherapy: Secondary | ICD-10-CM | POA: Diagnosis not present

## 2023-01-13 DIAGNOSIS — J3081 Allergic rhinitis due to animal (cat) (dog) hair and dander: Secondary | ICD-10-CM | POA: Diagnosis not present

## 2023-01-13 DIAGNOSIS — J301 Allergic rhinitis due to pollen: Secondary | ICD-10-CM | POA: Diagnosis not present

## 2023-01-13 DIAGNOSIS — J3089 Other allergic rhinitis: Secondary | ICD-10-CM | POA: Diagnosis not present

## 2023-01-17 ENCOUNTER — Encounter (HOSPITAL_BASED_OUTPATIENT_CLINIC_OR_DEPARTMENT_OTHER): Payer: Self-pay | Admitting: Family

## 2023-01-17 ENCOUNTER — Ambulatory Visit (HOSPITAL_BASED_OUTPATIENT_CLINIC_OR_DEPARTMENT_OTHER): Payer: Medicare PPO | Admitting: Family

## 2023-01-17 VITALS — BP 104/66 | HR 96 | Ht 76.0 in | Wt 245.0 lb

## 2023-01-17 DIAGNOSIS — I5022 Chronic systolic (congestive) heart failure: Secondary | ICD-10-CM

## 2023-01-17 DIAGNOSIS — E039 Hypothyroidism, unspecified: Secondary | ICD-10-CM

## 2023-01-17 DIAGNOSIS — I4811 Longstanding persistent atrial fibrillation: Secondary | ICD-10-CM

## 2023-01-17 DIAGNOSIS — I25118 Atherosclerotic heart disease of native coronary artery with other forms of angina pectoris: Secondary | ICD-10-CM

## 2023-01-17 DIAGNOSIS — E785 Hyperlipidemia, unspecified: Secondary | ICD-10-CM

## 2023-01-17 NOTE — Progress Notes (Signed)
Office Visit    Patient Name: [name] Date of Encounter: 01/17/2023  PCP:  Tresa Garter, MD   [facility]  Cardiologist:  Chilton Si, MD  Advanced Practice Provider:  No care team member to display Electrophysiologist:  None      Chief Complaint    RENE SIZELOVE is a 77 y.o. male presents today for atrial fibrillation follow-up  Past Medical History    Past Medical History:  Diagnosis Date   Benign localized prostatic hyperplasia with lower urinary tract symptoms (LUTS)    CAD (coronary artery disease) cardiologist--  dr t. Duke Salvia   hx of known CAD obstruction w/ collaterals (cath done @ [facility] 12-31-2011) ;   last cardiac cath 08-13-2019  showed sig. 2V CAD involving proxLAD and CTO of the RCA/  chronic total occlusion midRCA w/ bridging and L>R collaterals   Diastolic CHF    dx 06-15-2019 hospital admission (followed by pcp)   Diffuse large B cell lymphoma oncologist-- dr Candise Che--- in remission   dx 08/ 2019 -- bx left tonsill mass-- involving lymph nodes--- completed chemotherapy 10-12-2018   DJD (degenerative joint disease)    Dysthymic disorder    Environmental allergies    GAD (generalized anxiety disorder)    with dysthymia   GERD (gastroesophageal reflux disease)    History of falling    multiple times   History of kidney stones    HLD (hyperlipidemia)    Hyperlipidemia    Hypertension    Hypogonadism in male    OA (osteoarthritis)    OSA on CPAP    cpap machine-settings 17   Osteoporosis with fracture 04/24/2013   Persistent atrial fibrillation    On Xarelto, cardiologist--- dr Elvera Lennox. [place]   Plaque psoriasis    dermatologist--- dr Aris Lot--- currently taking otezla   Post-surgical hypothyroidism    followed by pcp---  s/p total thyroidectomy for graves disease in 1987   Wears hearing aid in both ears    Past Surgical History:  Procedure Laterality Date   APPENDECTOMY  2011 approx.    CARDIAC  CATHETERIZATION  12-31-2011   dr j. Desma Maxim @[facility]    normal LVF w/ multivessel CAD,  occluded RCA w/ colleterals   CATARACT EXTRACTION W/ INTRAOCULAR LENS  IMPLANT, BILATERAL  2016 approx.   COLONOSCOPY     CORONARY PRESSURE/FFR STUDY N/A 08/13/2019   Procedure: INTRAVASCULAR PRESSURE WIRE/FFR STUDY;  Surgeon: Yvonne Kendall, MD;  [facility];  Service: Cardiovascular;  Laterality: N/A;   CYSTOSCOPY WITH RETROGRADE PYELOGRAM, URETEROSCOPY AND STENT PLACEMENT Left 11/07/2017   Procedure: CYSTOSCOPY WITH RETROGRADE PYELOGRAM, URETEROSCOPY AND STENT PLACEMENT;  Surgeon: Malen Gauze, MD;  Location: [facility];  Service: Urology;  Laterality: Left;   CYSTOSCOPY WITH RETROGRADE PYELOGRAM, URETEROSCOPY AND STENT PLACEMENT Right 10/22/2019   Procedure: CYSTOSCOPY WITH RETROGRADE PYELOGRAM, URETEROSCOPY AND STENT PLACEMENT;  Surgeon: Malen Gauze, MD;  Location: [facility];  Service: Urology;  Laterality: Right;   CYSTOSCOPY/URETEROSCOPY/HOLMIUM LASER/STENT PLACEMENT Right 03/11/2017   Procedure: CYSTOSCOPY/URETEROSCOPYSTENT PLACEMENT right ureter retrograde pylegram;  Surgeon: Malen Gauze, MD;  Location: [facility];  Service: Urology;  Laterality: Right;   HARDWARE REMOVAL  10/05/2012   Procedure: HARDWARE REMOVAL;  Surgeon: Shelda Pal, MD;  Location: [facility];  Service: Orthopedics;  Laterality: Right;  REMOVING  STRYKER  GAMMA NAIL   HARDWARE REMOVAL Right 07/03/2013   Procedure: HARDWARE REMOVAL RIGHT TIBIA ;  Surgeon: Budd Palmer, MD;  Location: [facility];  Service: Orthopedics;  Laterality: Right;   HIP CLOSED REDUCTION Right 10/15/2013   Procedure: CLOSED MANIPULATION HIP;  Surgeon: Shelda Pal, MD;  Location: [facility];  Service: Orthopedics;  Laterality: Right;   HOLMIUM LASER APPLICATION Right 02/08/2017   Procedure: HOLMIUM LASER APPLICATION;  Surgeon: Malen Gauze, MD;  Location: [facility];  Service: Urology;  Laterality: Right;   HOLMIUM LASER  APPLICATION Left 03/26/2019   Procedure: HOLMIUM LASER APPLICATION;  Surgeon: Malen Gauze, MD;  Location: [facility];  Service: Urology;  Laterality: Left;   HOLMIUM LASER APPLICATION Left 04/17/2019   Procedure: HOLMIUM LASER APPLICATION;  Surgeon: Malen Gauze, MD;  Location: [facility];  Service: Urology;  Laterality: Left;   HOLMIUM LASER APPLICATION Right 10/22/2019   Procedure: HOLMIUM LASER APPLICATION;  Surgeon: Malen Gauze, MD;  Location[facility];  Service: Urology;  Laterality: Right;   INCISION AND DRAINAGE HIP Right 11/16/2013   Procedure: IRRIGATION AND DEBRIDEMENT RIGHT HIP;  Surgeon: Shelda Pal, MD;  Location: [facility];  Service: Orthopedics;  Laterality: Right;   IR IMAGING GUIDED PORT INSERTION  06/22/2018   IR NEPHROSTOMY PLACEMENT LEFT  03/28/2019   IR URETERAL STENT LEFT NEW ACCESS W/O SEP NEPHROSTOMY CATH  10/24/2017   IR URETERAL STENT LEFT NEW ACCESS W/O SEP NEPHROSTOMY CATH  03/26/2019   NEPHROLITHOTOMY Right 02/08/2017   Procedure: NEPHROLITHOTOMY PERCUTANEOUS WITH SURGEON ACCESS;  Surgeon: Malen Gauze, MD;  [facility]: [facility];  Service: Urology;  Laterality: Right;   NEPHROLITHOTOMY Left 10/24/2017   Procedure: NEPHROLITHOTOMY PERCUTANEOUS;  Surgeon: Malen Gauze, MD;  [facility]: [facility];  Service: Urology;  Laterality: Left;   NEPHROLITHOTOMY Left 03/26/2019   Procedure: NEPHROLITHOTOMY PERCUTANEOUS;  Surgeon: Malen Gauze, MD;  [facility]: [facility];  Service: Urology;  Laterality: Left;  2 HRS   NEPHROLITHOTOMY Left 04/17/2019   Procedure: NEPHROLITHOTOMY PERCUTANEOUS;  Surgeon: Malen Gauze, MD;  Location: [facility];  Service: Urology;  Laterality: Left;  2 HRS   ORIF TIBIA FRACTURE Right 02/06/2013   Procedure: OPEN REDUCTION INTERNAL FIXATION (ORIF) TIBIA FRACTURE WITH IM ROD FIBULA;  Surgeon: Budd Palmer, MD;  Location: [facility];  Service: Orthopedics;  Laterality: Right;   ORIF TIBIA FRACTURE Right 07/03/2013   Procedure:  RIGHT TIBIA NON UNION REPAIR ;  Surgeon: Budd Palmer, MD;  Location: [facility];  Service: Orthopedics;  Laterality: Right;   ORIF WRIST FRACTURE  10/02/2012   Procedure: OPEN REDUCTION INTERNAL FIXATION (ORIF) WRIST FRACTURE;  Surgeon: Dominica Severin, MD;  Location: [facility];  Service: Orthopedics;  Laterality: Right;  WITH   ANTIBIOTIC  CEMENT   ORIF WRIST FRACTURE Left 10/28/2013   Procedure: OPEN REDUCTION INTERNAL FIXATION (ORIF) WRIST FRACTURE with allograft;  Surgeon: Dominica Severin, MD;  Location: [facility];  Service: Orthopedics;  Laterality: Left;  DVR Plate   QUADRICEPS TENDON REPAIR Left 07/15/2017   Procedure: REPAIR QUADRICEP TENDON;  Surgeon: Gean Birchwood, MD;  Location: [facility] OR;  Service: Orthopedics;  Laterality: Left;   RIGHT/LEFT HEART CATH AND CORONARY ANGIOGRAPHY N/A 08/13/2019   Procedure: RIGHT/LEFT HEART CATH AND CORONARY ANGIOGRAPHY;  Surgeon: Yvonne Kendall, MD;  [facility];  Service: Cardiovascular;  Laterality: N/A;   THYROIDECTOMY  02/1986   TOTAL HIP ARTHROPLASTY Right 03-16-2016   @[facility]    TOTAL HIP REVISION  10/05/2012   Procedure: TOTAL HIP REVISION;  Surgeon: Shelda Pal, MD;  [facility]: [facility];  Service: Orthopedics;  Laterality:  Right;  RIGHT TOTAL HIP REVISION   TOTAL HIP REVISION Right 09/17/2013   Procedure: REVISION RIGHT TOTAL HIP ARTHROPLASTY ;  Surgeon: Shelda PalMatthew D Olin, MD;  Location: [facility];  Service: Orthopedics;  Laterality: Right;   TOTAL HIP REVISION Right 10/26/2013   Procedure: REVISION RIGHT TOTAL HIP ARTHROPLASTY;  Surgeon: Shelda PalMatthew D Olin, MD;  Location: [facility];  Service: Orthopedics;  Laterality: Right;   TOTAL KNEE ARTHROPLASTY Bilateral right 03-15-2011;  left 06-30-2011   TOTAL KNEE REVISION Left 04/11/2017   Procedure: TOTAL KNEE REVISION PATELLA and TIBIA;  Surgeon: Gean Birchwoodowan, Frank, MD;  Location: [facility];  Service: Orthopedics;  Laterality: Left;   Allergies  Allergies  Allergen Reactions   Short Ragweed Pollen Ext Cough     History of Present Illness    [name] is a 77 y.o. male with a hx of relapsed large B-cell lymphoma, CAD, recurrent UTI, atrial fibrillation, BPH, diastolic heart failure, hypertension, hyperlipidemia, OSA, hypothyroid, prior tobacco use last seen 10/18/22 via video visit.  Initially diagnosed atrial fibrillation around 2012 and started on diltiazem and amiodarone.  Amiodarone discontinued due to persistent atrial fibrillation.  Underwent LHC at time of diagnosis with reported 90% obstructive of unknown vessel that developed collaterals.  He was started on Ranexa.  Echo 10/2016 EF 60 to 65%, severe bilateral atrial enlargement, IVC dilated.  He had no angina so Ranexa discontinued.  Diagnosed with diffuse large B-cell lymphoma 05/2018.  Completed 6 cycles of chemotherapy.  Based on echo 06/2018 EF 50 to 55%, normal diastolic function, apical hypokinesis.  Heart rate poorly controlled to metoprolol added.  Seen in the ED and BNP elevated to Lasix 20 mg daily added with improvement in breathing.  Echo 06/2019 EF 50-55%, RV mildly reduced systolic function.  Lasix was transitioned to hydrochlorothiazide due to urinary frequency and poor blood pressure control.  LHC 08/2019 significant two-vessel CAD sequential 60 to 70% mid LAD lesions and CTO of mid RCA with bridging left-to-right collaterals.  Consideration for two-vessel CABG versus medical management.  Deemed not candidate for surgery.  He was started on Imdur.  Due to kidney stones and urination HCTZ reduced and losartan increased.  03/22/22 chemo resumed due to recurrence of diffuse large b-cell lymphoma.   Admitted 7/13 - 04/27/2022 after presenting with fatigue, lightheadedness found to have severe sepsis treated with IVF and IV abx due to urinary tract infection E. coli ESBL. Due to orthostatic hypotension and near syncope, diltiazem dose reduced. Xarelto and aspirin held due to thrombocytopenia.   Seen 05/04/2022 by his oncology team and  referred to infectious disease for recurrent UTI.  He was also set up for IV ABX through home care.  At follow-up via video visit 10/18/2022 elected to remain off Xarelto due to hematuria, frequent falls.  He presents today for follow-up with his husband, Casimiro NeedleMichael. He is undergoing Epcoritamab bispecific for refractory DLBCL with [facility] oncology.  Denies chest pain, orthopnea, edema, PND.  Exertional dyspnea stable at baseline.  Reports heart rate at home routinely less than 100 bpm.  No palpitations. Still with imbalance and concern for falls.   EKGs/Labs/Other Studies Reviewed:   The following studies were reviewed today:  Cardiac Studies & Procedures   CARDIAC CATHETERIZATION  CARDIAC CATHETERIZATION 08/13/2019  Narrative Conclusions: 1. Significant two-vessel coronary artery disease, including sequential 60% proximal and 70% mid LAD stenosis, which are hemodynamically significant by DFR and also involve large D2 branch, as well as chronic total occlusion of the mid RCA with  bridging and left-to-right collaterals. 2. Mildly elevated left heart and pulmonary artery pressures.  Prominent v-waves noted on PCWP; question if mitral regurgitation is more severe than appreciated on recent transthoracic echocardiogram. 3. Moderately elevated right heart filling pressures. 4. Normal Fick cardiac output/index.  Recommendations: 1. Outpatient cardiac surgery evaluation, given significant two vessel coronary artery disease including proximal LAD involvement and CTO of the RCA.  If comorbidities pose an excessively high perioperative risk, I would favor maximizing medical therapy.  If the patient has refractory symptoms, PCI to the proximal/mid LAD and D2 could be considered.  I would defer PCI to CTO of the RCA, as this has reportedly been present for 20+ years. 2. Consider gentle diuresis and continued medical management of chronic HFpEF and persistent atrial fibrillation. 3. Aggressive secondary  prevention.  Yvonne Kendall, MD [facility] Pager: [phone]  Findings Coronary Findings Diagnostic  Dominance: Right  Left Main Vessel is large. Vessel is angiographically normal.  Left Anterior Descending Prox LAD lesion is 60% stenosed. Pressure wire/FFR was performed on the lesion. DFR = 0.87. Mid LAD lesion is 70% stenosed. The lesion is eccentric. Pressure wire/FFR was performed on the lesion. DFR = 0.81.  First Diagonal Branch Vessel is small in size.  Second Diagonal Branch Vessel is large in size.  Left Circumflex Vessel is large. Mid Cx lesion is 20% stenosed.  First Obtuse Marginal Branch Vessel is moderate in size.  Second Obtuse Marginal Branch Vessel is large in size.  Third Obtuse Marginal Branch Vessel is large in size.  Right Coronary Artery Vessel is large. Collaterals Mid RCA filled by collaterals from Prox RCA.  Prox RCA to Mid RCA lesion is 99% stenosed. The lesion is chronically occluded with bridging and left-to-right collateral flow.  Right Posterior Atrioventricular Artery Collaterals RPAV filled by collaterals from Dist Cx.  Intervention  No interventions have been documented.     ECHOCARDIOGRAM  ECHOCARDIOGRAM COMPLETE 09/06/2022  Narrative ECHOCARDIOGRAM REPORT    Patient Name:   [name] Date of Exam: 09/06/2022 Medical Rec #:  [id number]        Height:       76.0 in Accession #:    [id number]       Weight:       232.0 lb Date of Birth:  July 08, 1946        BSA:          2.359 m Patient Age:    75 years         BP:           105/65 mmHg Patient Gender: M                HR:           90 bpm. Exam Location:  Inpatient  Procedure: 2D Echo, Cardiac Doppler and Color Doppler  Indications:    R55 Syncope  History:        Patient has prior history of Echocardiogram examinations, most recent 06/28/2019. CHF, CAD, Arrythmias:Atrial Fibrillation, Signs/Symptoms:Syncope; Risk Factors:Hypertension,  Dyslipidemia and GERD.  Sonographer:    Eulah Pont RDCS Referring Phys: Kenn File DOUTOVA   Sonographer Comments: No parasternal window. IMPRESSIONS   1. Left ventricular ejection fraction, by estimation, is 35 to 40%. The left ventricle has moderately decreased function. The left ventricle demonstrates global hypokinesis. Left ventricular diastolic parameters are indeterminate. 2. Right ventricular systolic function is mildly reduced. The right ventricular size is normal. Tricuspid regurgitation signal is inadequate for assessing PA  pressure. 3. Left atrial size was mildly dilated. 4. Right atrial size was mildly dilated. 5. The mitral valve is normal in structure. No evidence of mitral valve regurgitation. No evidence of mitral stenosis. 6. The aortic valve was not well visualized. There is mild calcification of the aortic valve. Aortic valve regurgitation is not visualized. No aortic stenosis is present. 7. The inferior vena cava is dilated in size with >50% respiratory variability, suggesting right atrial pressure of 8 mmHg. 8. The patient was in atrial fibrillation.  FINDINGS Left Ventricle: Left ventricular ejection fraction, by estimation, is 35 to 40%. The left ventricle has moderately decreased function. The left ventricle demonstrates global hypokinesis. The left ventricular internal cavity size was normal in size. There is no left ventricular hypertrophy. Left ventricular diastolic parameters are indeterminate.  Right Ventricle: The right ventricular size is normal. No increase in right ventricular wall thickness. Right ventricular systolic function is mildly reduced. Tricuspid regurgitation signal is inadequate for assessing PA pressure. The tricuspid regurgitant velocity is 1.39 m/s, and with an assumed right atrial pressure of 8 mmHg, the estimated right ventricular systolic pressure is 15.7 mmHg.  Left Atrium: Left atrial size was mildly dilated.  Right Atrium:  Right atrial size was mildly dilated.  Pericardium: There is no evidence of pericardial effusion.  Mitral Valve: The mitral valve is normal in structure. No evidence of mitral valve regurgitation. No evidence of mitral valve stenosis.  Tricuspid Valve: The tricuspid valve is normal in structure. Tricuspid valve regurgitation is trivial.  Aortic Valve: The aortic valve was not well visualized. There is mild calcification of the aortic valve. Aortic valve regurgitation is not visualized. No aortic stenosis is present.  Pulmonic Valve: The pulmonic valve was not well visualized. Pulmonic valve regurgitation is trivial.  Aorta: The aortic root is normal in size and structure.  Venous: The inferior vena cava is dilated in size with greater than 50% respiratory variability, suggesting right atrial pressure of 8 mmHg.  IAS/Shunts: No atrial level shunt detected by color flow Doppler.   LEFT VENTRICLE PLAX 2D LVIDd:         4.90 cm LVIDs:         3.60 cm LV PW:         0.80 cm LV IVS:        0.80 cm LVOT diam:     2.00 cm LV SV:         46 LV SV Index:   19 LVOT Area:     3.14 cm  LV Volumes (MOD) LV vol d, MOD A2C: 86.4 ml LV vol d, MOD A4C: 87.4 ml LV vol s, MOD A2C: 35.4 ml LV vol s, MOD A4C: 38.8 ml LV SV MOD A2C:     51.0 ml LV SV MOD A4C:     87.4 ml LV SV MOD BP:      48.9 ml  RIGHT VENTRICLE TAPSE (M-mode): 1.9 cm  LEFT ATRIUM             Index        RIGHT ATRIUM           Index LA diam:        4.30 cm 1.82 cm/m   RA Area:     26.20 cm LA Vol (A2C):   68.5 ml 29.03 ml/m  RA Volume:   79.40 ml  33.65 ml/m LA Vol (A4C):   75.2 ml 31.87 ml/m LA Biplane Vol: 75.9 ml 32.17 ml/m AORTIC VALVE LVOT  Vmax:   92.37 cm/s LVOT Vmean:  60.400 cm/s LVOT VTI:    0.146 m  AORTA Ao Root diam: 3.30 cm  TRICUSPID VALVE TR Peak grad:   7.7 mmHg TR Vmax:        139.00 cm/s  SHUNTS Systemic VTI:  0.15 m Systemic Diam: 2.00 cm  Dalton McleanMD Electronically signed by  Wilfred Lacy Signature Date/Time: 09/06/2022/2:21:19 PM    Final             EKG: No EKG today  Recent Labs: 09/06/2022: TSH 14.031 09/16/2022: ALT 28; Magnesium 1.9 09/19/2022: BUN 34; Creatinine, Ser 1.24; Potassium 4.2; Sodium 137 09/20/2022: Hemoglobin 12.0; Platelets 129  Recent Lipid Panel    Component Value Date/Time   CHOL 102 10/28/2021 1001   TRIG 64 10/28/2021 1001   HDL 45 10/28/2021 1001   CHOLHDL 2.3 10/28/2021 1001   CHOLHDL 2.1 02/24/2021 1037   VLDL 18 02/24/2021 1037   LDLCALC 43 10/28/2021 1001    Risk Assessment/Calculations:   CHA2DS2-VASc Score = 4   This indicates a 4.8% annual risk of stroke. The patient's score is based upon: CHF History: 1 HTN History: 1 Diabetes History: 0 Stroke History: 0 Vascular Disease History: 0 Age Score: 2 Gender Score: 0     Home Medications   Current Meds  Medication Sig   acetaminophen (TYLENOL) 325 MG tablet Take 2 tablets (650 mg total) by mouth every 6 (six) hours as needed for mild pain (or Fever >/= 101).   acyclovir (ZOVIRAX) 400 MG tablet Take 400 mg by mouth 2 (two) times daily.   albuterol (VENTOLIN HFA) 108 (90 Base) MCG/ACT inhaler Inhale 2 puffs into the lungs every 6 (six) hours as needed for wheezing or shortness of breath.   alfuzosin (UROXATRAL) 10 MG 24 hr tablet TAKE 1 TABLET AT BEDTIME   amoxicillin (AMOXIL) 500 MG capsule TAKE 4 CAPSULES BY MOUTH ONE HOUR PRIOR TO DENTAL APPOINTMENT/PROCEDURE.   atorvastatin (LIPITOR) 80 MG tablet TAKE 1 TABLET EVERY DAY (Patient taking differently: Take 80 mg by mouth at bedtime.)   buPROPion (WELLBUTRIN XL) 300 MG 24 hr tablet TAKE 1 TABLET EVERY DAY WITH BREAKFAST (Patient taking differently: Take 300 mg by mouth daily.)   Calcium Citrate-Vitamin D (CALCIUM CITRATE + D PO) Take 1 tablet by mouth 2 (two) times daily. 1200mg  of Calcium and 1000 units vitamin D3   Cholecalciferol (VITAMIN D) 50 MCG (2000 UT) tablet Take 4,000 Units by mouth in the  morning and at bedtime.   clonazePAM (KLONOPIN) 1 MG tablet Take 1 tablet (1 mg total) by mouth 3 (three) times daily as needed for anxiety.   CRANBERRY PO Take 1 tablet by mouth in the morning and at bedtime.   denosumab (PROLIA) 60 MG/ML SOSY injection Inject 60 mg as directed every 6 (six) months.   denosumab (PROLIA) 60 MG/ML SOSY injection Inject 60 mg into the skin every 6 (six) months.   diltiazem (TIAZAC) 240 MG 24 hr capsule 240 mg.   DULoxetine (CYMBALTA) 60 MG capsule TAKE 1 CAPSULE TWICE DAILY (Patient taking differently: Take 60 mg by mouth 2 (two) times daily.)   EPINEPHrine 0.3 mg/0.3 mL IJ SOAJ injection Inject 0.3 mg into the muscle once as needed for anaphylaxis (severe allergic reaction).   ezetimibe (ZETIA) 10 MG tablet TAKE 1 TABLET EVERY DAY   feeding supplement (ENSURE ENLIVE / ENSURE PLUS) LIQD Take 237 mLs by mouth 2 (two) times daily between meals. (Patient taking differently: Take 237 mLs  by mouth daily.)   ferrous sulfate 325 (65 FE) MG tablet Take 325 mg by mouth 2 (two) times daily with a meal.   levothyroxine (SYNTHROID) 125 MCG tablet Take 1 tablet (125 mcg total) by mouth daily at 6 (six) AM.   lidocaine-prilocaine (EMLA) cream Apply 1 Application topically as needed. (Patient taking differently: Apply 1 Application topically as needed (For port access/chemotherapy).)   loratadine (CLARITIN) 10 MG tablet Take 10 mg by mouth daily.   Magnesium 250 MG TABS Take 250 mg by mouth daily.   metoprolol tartrate (LOPRESSOR) 25 MG tablet Take 25 mg by mouth 2 (two) times daily.   Multiple Vitamin (MULTIVITAMIN WITH MINERALS) TABS tablet Take 1 tablet by mouth every morning. Men's One-A-Day 50+   MYRBETRIQ 25 MG TB24 tablet TAKE 1 TABLET EVERY DAY (Patient taking differently: Take 25 mg by mouth at bedtime.)   nitroGLYCERIN (NITROSTAT) 0.4 MG SL tablet Place 0.4 mg under the tongue every 5 (five) minutes as needed for chest pain.   omeprazole (PRILOSEC) 40 MG capsule TAKE 1  CAPSULE EVERY DAY BEFORE BREAKFAST (Patient taking differently: Take 40 mg by mouth daily.)   Potassium Citrate 15 MEQ (1620 MG) TBCR Take 1 tablet by mouth See admin instructions. Take one tablet (15 meq) by mouth twice daily - with lunch and supper (Patient taking differently: Take 1 tablet by mouth in the morning and at bedtime.)   Probiotic Product (PROBIOTIC PO) Take 1 capsule by mouth daily with breakfast.   solifenacin (VESICARE) 10 MG tablet Take 10 mg by mouth daily.   Specialty Vitamins Products (MG PLUS PROTEIN) 133 MG TABS Take 1 tablet by mouth 3 (three) times daily.   sulfamethoxazole-trimethoprim (BACTRIM DS) 800-160 MG tablet Take 1 tablet by mouth 3 (three) times a week.   tamsulosin (FLOMAX) 0.4 MG CAPS capsule Take 1 capsule by mouth daily.   testosterone enanthate (DELATESTRYL) 200 MG/ML injection INJECT (200MG ) INTO THE MUSCLE EVERY 14 DAYS. DISCARD VIAL AFTER 28 DAYS. (Patient taking differently: Inject 200 mg into the muscle every 14 (fourteen) days. Every other Friday)   vitamin C (ASCORBIC ACID) 500 MG tablet Take 500 mg by mouth daily with lunch.   [DISCONTINUED] isosorbide mononitrate (IMDUR) 30 MG 24 hr tablet Take 1 tablet by mouth daily.     Review of Systems      All other systems reviewed and are otherwise negative except as noted above.  Physical Exam    VS:  BP 104/66   Pulse 96   Ht 6\' 4"  (1.93 m)   Wt 245 lb (111.1 kg)   BMI 29.82 kg/m  , BMI Body mass index is 29.82 kg/m.  Wt Readings from Last 3 Encounters:  01/17/23 245 lb (111.1 kg)  01/11/23 240 lb (108.9 kg)  11/23/22 240 lb 3.2 oz (109 kg)    GEN: Well nourished, well developed, in no acute distress. HEENT: normal. Neck: Supple, no JVD, carotid bruits, or masses. Cardiac: IRIR, no murmurs, rubs, or gallops. No clubbing, cyanosis, edema.  Radials/PT 2+ and equal bilaterally.  Respiratory:  Respirations regular and unlabored, clear to auscultation bilaterally. GI: Soft, nontender,  nondistended. MS: No deformity or atrophy. Skin: Warm and dry, no rash. Neuro:  Strength and sensation are intact. Psych: Normal affect.  Assessment & Plan    Longstanding persistent atrial fibrillation - Xarelto on hold due to thrombocytosis, hematuria, fall risk. Lengthy discussion with [name] and his husband regarding risk/benefit of OAC, have elected to remain off  and can continue to readdress at follow up. CHA2DS2-VASc Score = 4 [CHF History: 1, HTN History: 1, Diabetes History: 0, Stroke History: 0, Vascular Disease History: 0, Age Score: 2, Gender Score: 0].  Therefore, the patient's annual risk of stroke is 4.8 %.       Rate controlled with heart rate routinely less than 100 bpm at home per his report.  Continue diltiazem 240 mg daily, metoprolol titrate 25 mg twice daily.    B cell lymphoma - On Epcoritamab per oncology at [facility].    Recurrent UTI - Completed IV abx. Followed with Dr. Daiva Eves of ID. No recurrent symptoms.   Hypothyroidism - Continue to follow with PCP.   CAD -medically managed moderate to severe CAD. Stable with no anginal symptoms. No indication for ischemic evaluation.  GDMT metoprolol, atorvastatin.Imdur previously discontinued due to hypotension.   Systolic and diastolic heart failure - Euvolemic and well compensated on exam.  Echo 06/2019 LVEF 50-55% ? Echo 08/2022 LVEF 35-40%. No indication for loop diuretic at this time.  Continue beta-blocker at present dose. Losartan, imdur previously discontinued due to hypotension. Low sodium diet, fluid restriction <2L, and daily weights encouraged. Educated to contact our office for weight gain of 2 lbs overnight or 5 lbs in one week.   Hypertension - BP well controlled. Continue current antihypertensive regimen.  Previous hypotension has resolved.  Hyperlipidemia, LDL goal less than 70 -continue atorvastatin.  Denies myalgias. Due for lipid panel, direct LDL. Will ask oncology team to collect at upcoming OV to utilize  port.   Disposition: Follow up in 6 month(s) with Chilton Si, MD or APP.  Signed, Alver Sorrow, NP 01/17/2023, 4:14 PM  [facility]

## 2023-01-17 NOTE — Patient Instructions (Signed)
Medication Instructions:  Your physician recommends that you continue on your current medications as directed. Please refer to the Current Medication list given to you today.  *If you need a refill on your cardiac medications before your next appointment, please call your pharmacy*   Lab Work: Lab with Oncology  Follow-Up: At [facility], you and your health needs are our priority.  As part of our continuing mission to provide you with exceptional heart care, we have created designated Provider Care Teams.  These Care Teams include your primary Cardiologist (physician) and Advanced Practice Providers (APPs -  Physician Assistants and Nurse Practitioners) who all work together to provide you with the care you need, when you need it.  We recommend signing up for the patient portal called "MyChart".  Sign up information is provided on this After Visit Summary.  MyChart is used to connect with patients for Virtual Visits (Telemedicine).  Patients are able to view lab/test results, encounter notes, upcoming appointments, etc.  Non-urgent messages can be sent to your provider as well.   To learn more about what you can do with MyChart, go to [email or web address].    Your next appointment:   6 month(s)  Provider:   Chilton Si, MD or Gillian Shields, NP

## 2023-01-19 DIAGNOSIS — F410 Panic disorder [episodic paroxysmal anxiety] without agoraphobia: Secondary | ICD-10-CM | POA: Diagnosis not present

## 2023-01-19 DIAGNOSIS — F331 Major depressive disorder, recurrent, moderate: Secondary | ICD-10-CM | POA: Diagnosis not present

## 2023-01-26 DIAGNOSIS — C8330 Diffuse large B-cell lymphoma, unspecified site: Secondary | ICD-10-CM | POA: Diagnosis not present

## 2023-01-26 DIAGNOSIS — Z9221 Personal history of antineoplastic chemotherapy: Secondary | ICD-10-CM | POA: Diagnosis not present

## 2023-01-26 DIAGNOSIS — C8590 Non-Hodgkin lymphoma, unspecified, unspecified site: Secondary | ICD-10-CM | POA: Diagnosis not present

## 2023-01-26 DIAGNOSIS — Z5111 Encounter for antineoplastic chemotherapy: Secondary | ICD-10-CM | POA: Diagnosis not present

## 2023-01-28 DIAGNOSIS — I25118 Atherosclerotic heart disease of native coronary artery with other forms of angina pectoris: Secondary | ICD-10-CM | POA: Diagnosis not present

## 2023-01-28 DIAGNOSIS — E785 Hyperlipidemia, unspecified: Secondary | ICD-10-CM | POA: Diagnosis not present

## 2023-01-29 LAB — LIPID PANEL
Chol/HDL Ratio: 2 ratio (ref 0.0–5.0)
Cholesterol, Total: 102 mg/dL (ref 100–199)
HDL: 51 mg/dL (ref >39)
LDL Chol Calc (NIH): 36 mg/dL (ref 0–99)
Triglycerides: 68 mg/dL (ref 0–149)
VLDL Cholesterol Cal: 15 mg/dL (ref 5–40)

## 2023-01-29 LAB — LDL CHOLESTEROL, DIRECT: LDL Direct: 37 mg/dL (ref 0–99)

## 2023-01-31 ENCOUNTER — Telehealth: Payer: Self-pay | Admitting: Internal Medicine

## 2023-01-31 NOTE — Telephone Encounter (Signed)
Contacted Marcy Salvo to schedule their annual wellness visit. Appointment made for 02/01/2023.  [facility] Care Guide [facility] AWV TEAM Direct Dial: [phone]

## 2023-02-01 ENCOUNTER — Ambulatory Visit (INDEPENDENT_AMBULATORY_CARE_PROVIDER_SITE_OTHER): Payer: Medicare PPO

## 2023-02-01 VITALS — Ht 76.0 in | Wt 247.8 lb

## 2023-02-01 DIAGNOSIS — Z0000 Encounter for general adult medical examination without abnormal findings: Secondary | ICD-10-CM | POA: Diagnosis not present

## 2023-02-01 NOTE — Progress Notes (Addendum)
Subjective:   [name] is a 77 y.o. male who presents for Medicare Annual/Subsequent preventive examination.  I connected with  Marcy Salvo on 02/01/23 by a audio enabled telemedicine application and verified that I am speaking with the correct person using two identifiers.  Patient Location: Home  Provider Location: Home Office  I discussed the limitations of evaluation and management by telemedicine. The patient expressed understanding and agreed to proceed.   Review of Systems     Cardiac Risk Factors include: advanced age (>2men, >16 women);dyslipidemia;male gender;obesity (BMI >30kg/m2);hypertension     Objective:    Today's Vitals   02/01/23 0821  Weight: 247 lb 12.8 oz (112.4 kg)  Height:  (1.93 m)   Body mass index is 30.16 kg/m.     02/01/2023   10:00 AM 01/11/2023    5:39 PM 11/07/2022    1:13 PM 09/16/2022   11:00 PM 09/06/2022   11:00 PM 09/06/2022   12:17 AM 07/22/2022    1:00 AM  Advanced Directives  Does Patient Have a Medical Advance Directive? No Yes Yes Yes  Yes Yes  Type of Advance Directive  Living will;Healthcare Power of Attorney Living will Healthcare Power of [place];Living will Healthcare Power of [place];Living will  Out of facility DNR (pink MOST or yellow form)  Does patient want to make changes to medical advance directive? No - Patient declined No - Patient declined  No - Patient declined No - Patient declined No - Patient declined No - Patient declined  Copy of Healthcare Power of Attorney in Chart?  No - copy requested  No - copy requested No - copy requested    Would patient like information on creating a medical advance directive? No - Patient declined          Current Medications (verified) Outpatient Encounter Medications as of 02/01/2023  Medication Sig   acetaminophen (TYLENOL) 325 MG tablet Take 2 tablets (650 mg total) by mouth every 6 (six) hours as needed for mild pain (or Fever >/= 101).   acyclovir (ZOVIRAX)  400 MG tablet Take 400 mg by mouth 2 (two) times daily.   albuterol (VENTOLIN HFA) 108 (90 Base) MCG/ACT inhaler Inhale 2 puffs into the lungs every 6 (six) hours as needed for wheezing or shortness of breath.   alfuzosin (UROXATRAL) 10 MG 24 hr tablet TAKE 1 TABLET AT BEDTIME   allopurinol (ZYLOPRIM) 100 MG tablet Take 100 mg by mouth daily.   amoxicillin (AMOXIL) 500 MG capsule TAKE 4 CAPSULES BY MOUTH ONE HOUR PRIOR TO DENTAL APPOINTMENT/PROCEDURE.   atorvastatin (LIPITOR) 80 MG tablet TAKE 1 TABLET EVERY DAY (Patient taking differently: Take 80 mg by mouth at bedtime.)   buPROPion (WELLBUTRIN XL) 300 MG 24 hr tablet TAKE 1 TABLET EVERY DAY WITH BREAKFAST (Patient taking differently: Take 300 mg by mouth daily.)   Calcium Citrate-Vitamin D (CALCIUM CITRATE + D PO) Take 1 tablet by mouth 2 (two) times daily.  of Calcium and 1000 units vitamin D3   Cholecalciferol (VITAMIN D) 50 MCG (2000 UT) tablet Take 4,000 Units by mouth in the morning and at bedtime.   clonazePAM (KLONOPIN) 1 MG tablet Take 1 tablet (1 mg total) by mouth 3 (three) times daily as needed for anxiety.   CRANBERRY PO Take 1 tablet by mouth in the morning and at bedtime.   denosumab (PROLIA) 60 MG/ML SOSY injection Inject 60 mg as directed every 6 (six) months.   diltiazem (TIAZAC) 240 MG 24 hr  capsule 240 mg.   DULoxetine (CYMBALTA) 60 MG capsule TAKE 1 CAPSULE TWICE DAILY (Patient taking differently: Take 60 mg by mouth 2 (two) times daily.)   EPINEPHrine 0.3 mg/0.3 mL IJ SOAJ injection Inject 0.3 mg into the muscle once as needed for anaphylaxis (severe allergic reaction).   ezetimibe (ZETIA) 10 MG tablet TAKE 1 TABLET EVERY DAY   feeding supplement (ENSURE ENLIVE / ENSURE PLUS) LIQD Take 237 mLs by mouth 2 (two) times daily between meals. (Patient taking differently: Take 237 mLs by mouth daily.)   ferrous sulfate 325 (65 FE) MG tablet Take 325 mg by mouth 2 (two) times daily with a meal.   lactobacillus acidophilus  (BACID) TABS tablet Take 1 tablet by mouth daily.   levothyroxine (SYNTHROID) 125 MCG tablet Take 1 tablet (125 mcg total) by mouth daily at 6 (six) AM.   lidocaine-prilocaine (EMLA) cream Apply 1 Application topically as needed. (Patient taking differently: Apply 1 Application topically as needed (For port access/chemotherapy).)   loratadine (CLARITIN) 10 MG tablet Take 10 mg by mouth daily.   Magnesium 250 MG TABS Take 250 mg by mouth daily.   metoprolol tartrate (LOPRESSOR) 25 MG tablet Take 25 mg by mouth 2 (two) times daily.   Multiple Vitamin (MULTIVITAMIN WITH MINERALS) TABS tablet Take 1 tablet by mouth every morning. Men's One-A-Day 50+   MYRBETRIQ 25 MG TB24 tablet TAKE 1 TABLET EVERY DAY (Patient taking differently: Take 25 mg by mouth at bedtime.)   nitroGLYCERIN (NITROSTAT) 0.4 MG SL tablet Place 0.4 mg under the tongue every 5 (five) minutes as needed for chest pain.   omeprazole (PRILOSEC) 40 MG capsule TAKE 1 CAPSULE EVERY DAY BEFORE BREAKFAST (Patient taking differently: Take 40 mg by mouth daily.)   Potassium Citrate 15 MEQ (1620 MG) TBCR Take 1 tablet by mouth See admin instructions. Take one tablet (15 meq) by mouth twice daily - with lunch and supper (Patient taking differently: Take 1 tablet by mouth in the morning and at bedtime.)   Probiotic Product (PROBIOTIC PO) Take 1 capsule by mouth daily with breakfast.   solifenacin (VESICARE) 10 MG tablet Take 10 mg by mouth daily.   Specialty Vitamins Products (MG PLUS PROTEIN) 133 MG TABS Take 1 tablet by mouth 3 (three) times daily.   tamsulosin (FLOMAX) 0.4 MG CAPS capsule Take 1 capsule by mouth daily.   testosterone enanthate (DELATESTRYL) 200 MG/ML injection INJECT ( ) INTO THE MUSCLE EVERY 14 DAYS. DISCARD VIAL AFTER 28 DAYS. (Patient taking differently: Inject 200 mg into the muscle every 14 (fourteen) days. Every other Friday)   vitamin C (ASCORBIC ACID) 500 MG tablet Take 500 mg by mouth daily with lunch.    denosumab (PROLIA) 60 MG/ML SOSY injection Inject 60 mg into the skin every 6 (six) months.   Risankizumab-rzaa (SKYRIZI [place]) Inject into the skin. On hold (Patient not taking: Reported on 02/01/2023)   sulfamethoxazole-trimethoprim (BACTRIM DS) 800-160 MG tablet Take 1 tablet by mouth 3 (three) times a week. (Patient not taking: Reported on 02/01/2023)   No facility-administered encounter medications on file as of 02/01/2023.    Allergies (verified) Short ragweed pollen ext   History: Past Medical History:  Diagnosis Date   Benign localized prostatic hyperplasia with lower urinary tract symptoms (LUTS)    CAD (coronary artery disease) cardiologist--  dr t. Duke Salvia   hx of known CAD obstruction w/ collaterals (cath done @ [facility] 12-31-2011) ;   last cardiac cath 08-13-2019  showed sig. 2V CAD involving  proxLAD and CTO of the RCA/  chronic total occlusion midRCA w/ bridging and L>R collaterals   Diastolic CHF    dx 06-15-2019 hospital admission (followed by pcp)   Diffuse large B cell lymphoma oncologist-- dr Candise Che--- in remission   dx 08/ 2019 -- bx left tonsill mass-- involving lymph nodes--- completed chemotherapy 10-12-2018   DJD (degenerative joint disease)    Dysthymic disorder    Environmental allergies    GAD (generalized anxiety disorder)    with dysthymia   GERD (gastroesophageal reflux disease)    History of falling    multiple times   History of kidney stones    HLD (hyperlipidemia)    Hyperlipidemia    Hypertension    Hypogonadism in male    OA (osteoarthritis)    OSA on CPAP    cpap machine-settings 17   Osteoporosis with fracture 04/24/2013   Persistent atrial fibrillation    On Xarelto, cardiologist--- dr Elvera Lennox. [place]   Plaque psoriasis    dermatologist--- dr Aris Lot--- currently taking otezla   Post-surgical hypothyroidism    followed by pcp---  s/p total thyroidectomy for graves disease in 1987   Wears hearing aid in both ears    Past Surgical History:   Procedure Laterality Date   APPENDECTOMY  2011 approx.    CARDIAC CATHETERIZATION  12-31-2011   dr j. Desma Maxim @[facility]    normal LVF w/ multivessel CAD,  occluded RCA w/ colleterals   CATARACT EXTRACTION W/ INTRAOCULAR LENS  IMPLANT, BILATERAL  2016 approx.   COLONOSCOPY     CORONARY PRESSURE/FFR STUDY N/A 08/13/2019   Procedure: INTRAVASCULAR PRESSURE WIRE/FFR STUDY;  Surgeon: Yvonne Kendall, MD;  [facility];  Service: Cardiovascular;  Laterality: N/A;   CYSTOSCOPY WITH RETROGRADE PYELOGRAM, URETEROSCOPY AND STENT PLACEMENT Left 11/07/2017   Procedure: CYSTOSCOPY WITH RETROGRADE PYELOGRAM, URETEROSCOPY AND STENT PLACEMENT;  Surgeon: Malen Gauze, MD;  Location: [facility];  Service: Urology;  Laterality: Left;   CYSTOSCOPY WITH RETROGRADE PYELOGRAM, URETEROSCOPY AND STENT PLACEMENT Right 10/22/2019   Procedure: CYSTOSCOPY WITH RETROGRADE PYELOGRAM, URETEROSCOPY AND STENT PLACEMENT;  Surgeon: Malen Gauze, MD;  Location[facility];  Service: Urology;  Laterality: Right;   CYSTOSCOPY/URETEROSCOPY/HOLMIUM LASER/STENT PLACEMENT Right 03/11/2017   Procedure: CYSTOSCOPY/URETEROSCOPYSTENT PLACEMENT right ureter retrograde pylegram;  Surgeon: Malen Gauze, MD;  Location: [facility];  Service: Urology;  Laterality: Right;   HARDWARE REMOVAL  10/05/2012   Procedure: HARDWARE REMOVAL;  Surgeon: Shelda Pal, MD;  Location: [facility];  Service: Orthopedics;  Laterality: Right;  REMOVING  STRYKER  GAMMA NAIL   HARDWARE REMOVAL Right 07/03/2013   Procedure: HARDWARE REMOVAL RIGHT TIBIA ;  Surgeon: Budd Palmer, MD;  Location: [facility];  Service: Orthopedics;  Laterality: Right;   HIP CLOSED REDUCTION Right 10/15/2013   Procedure: CLOSED MANIPULATION HIP;  Surgeon: Shelda Pal, MD;  Location: [facility];  Service: Orthopedics;  Laterality: Right;   HOLMIUM LASER APPLICATION Right 02/08/2017   Procedure: HOLMIUM LASER APPLICATION;  Surgeon: Malen Gauze, MD;  Location:  [facility];  Service: Urology;  Laterality: Right;   HOLMIUM LASER APPLICATION Left 03/26/2019   Procedure: HOLMIUM LASER APPLICATION;  Surgeon: Malen Gauze, MD;  [facility];  Service: Urology;  Laterality: Left;   HOLMIUM LASER APPLICATION Left 04/17/2019   Procedure: HOLMIUM LASER APPLICATION;  Surgeon: Malen Gauze, MD;  [facility];  Service: Urology;  Laterality: Left;   HOLMIUM LASER APPLICATION Right 10/22/2019   Procedure:  HOLMIUM LASER APPLICATION;  Surgeon: Malen Gauze, MD;  [facility];  Service: Urology;  Laterality: Right;   INCISION AND DRAINAGE HIP Right 11/16/2013   Procedure: IRRIGATION AND DEBRIDEMENT RIGHT HIP;  Surgeon: Shelda Pal, MD;  [facility];  Service: Orthopedics;  Laterality: Right;   IR IMAGING GUIDED PORT INSERTION  06/22/2018   IR NEPHROSTOMY PLACEMENT LEFT  03/28/2019   IR URETERAL STENT LEFT NEW ACCESS W/O SEP NEPHROSTOMY CATH  10/24/2017   IR URETERAL STENT LEFT NEW ACCESS W/O SEP NEPHROSTOMY CATH  03/26/2019   NEPHROLITHOTOMY Right 02/08/2017   Procedure: NEPHROLITHOTOMY PERCUTANEOUS WITH SURGEON ACCESS;  Surgeon: Malen Gauze, MD;  [facility];  Service: Urology;  Laterality: Right;   NEPHROLITHOTOMY Left 10/24/2017   Procedure: NEPHROLITHOTOMY PERCUTANEOUS;  Surgeon: Malen Gauze, MD;  [facility];  Service: Urology;  Laterality: Left;   NEPHROLITHOTOMY Left 03/26/2019   Procedure: NEPHROLITHOTOMY PERCUTANEOUS;  Surgeon: Malen Gauze, MD;  [facility];  Service: Urology;  Laterality: Left;  2 HRS   NEPHROLITHOTOMY Left 04/17/2019   Procedure: NEPHROLITHOTOMY PERCUTANEOUS;  Surgeon: Malen Gauze, MD;  [facility];  Service: Urology;  Laterality: Left;  2 HRS   ORIF TIBIA FRACTURE Right 02/06/2013   Procedure: OPEN REDUCTION INTERNAL FIXATION (ORIF) TIBIA FRACTURE WITH IM ROD FIBULA;  Surgeon: Budd Palmer, MD;  [facility];  Service: Orthopedics;  Laterality:  Right;   ORIF TIBIA FRACTURE Right 07/03/2013   Procedure: RIGHT TIBIA NON UNION REPAIR ;  Surgeon: Budd Palmer, MD;  Location: [facility] OR;  Service: Orthopedics;  Laterality: Right;   ORIF WRIST FRACTURE  10/02/2012   Procedure: OPEN REDUCTION INTERNAL FIXATION (ORIF) WRIST FRACTURE;  Surgeon: Dominica Severin, MD;  [facility];  Service: Orthopedics;  Laterality: Right;  WITH   ANTIBIOTIC  CEMENT   ORIF WRIST FRACTURE Left 10/28/2013   Procedure: OPEN REDUCTION INTERNAL FIXATION (ORIF) WRIST FRACTURE with allograft;  Surgeon: Dominica Severin, MD;  [facility];  Service: Orthopedics;  Laterality: Left;  DVR Plate   QUADRICEPS TENDON REPAIR Left 07/15/2017   Procedure: REPAIR QUADRICEP TENDON;  Surgeon: Gean Birchwood, MD;  Location[facility] OR;  Service: Orthopedics;  Laterality: Left;   RIGHT/LEFT HEART CATH AND CORONARY ANGIOGRAPHY N/A 08/13/2019   Procedure: RIGHT/LEFT HEART CATH AND CORONARY ANGIOGRAPHY;  Surgeon: Yvonne Kendall, MD;  [facility];  Service: Cardiovascular;  Laterality: N/A;   THYROIDECTOMY  02/1986   TOTAL HIP ARTHROPLASTY Right 03-16-2016   @[facility]    TOTAL HIP REVISION  10/05/2012   Procedure: TOTAL HIP REVISION;  Surgeon: Shelda Pal, MD;  [facility];  Service: Orthopedics;  Laterality: Right;  RIGHT TOTAL HIP REVISION   TOTAL HIP REVISION Right 09/17/2013   Procedure: REVISION RIGHT TOTAL HIP ARTHROPLASTY ;  Surgeon: Shelda Pal, MD;  [facility];  Service: Orthopedics;  Laterality: Right;   TOTAL HIP REVISION Right 10/26/2013   Procedure: REVISION RIGHT TOTAL HIP ARTHROPLASTY;  Surgeon: Shelda Pal, MD;  [facility];  Service: Orthopedics;  Laterality: Right;   TOTAL KNEE ARTHROPLASTY Bilateral right 03-15-2011;  left 06-30-2011   TOTAL KNEE REVISION Left 04/11/2017   Procedure: TOTAL KNEE REVISION PATELLA and TIBIA;  Surgeon: Gean Birchwood, MD;  [facility]: [facility];  Service: Orthopedics;  Laterality: Left;   Family History  Problem  Relation Age of Onset   CAD Father 38   Asthma Father    Alcohol abuse  Father    Arthritis Mother    Alcohol abuse Sister    Social History   Socioeconomic History   Marital status: Married    Spouse name: Casimiro Needle   Number of children: Not on file   Years of education: Not on file   Highest education level: Not on file  Occupational History   Not on file  Tobacco Use   Smoking status: Former    Packs/day: 1.00    Years: 50.00    Additional pack years: 0.00    Total pack years: 50.00    Types: Cigarettes    Quit date: 01/12/2012    Years since quitting: 11.0   Smokeless tobacco: Never  Vaping Use   Vaping Use: Never used  Substance and Sexual Activity   Alcohol use: Yes    Comment: rarely   Drug use: Never   Sexual activity: Yes  Other Topics Concern   Not on file  Social History Narrative   Lives In home with spouse Casimiro Needle      Social Determinants of Health   Financial Resource Strain: Low Risk  (02/01/2023)   Overall Financial Resource Strain (CARDIA)    Difficulty of Paying Living Expenses: Not hard at all  Food Insecurity: No Food Insecurity (02/01/2023)   Hunger Vital Sign    Worried About Running Out of Food in the Last Year: Never true    Ran Out of Food in the Last Year: Never true  Transportation Needs: No Transportation Needs (02/01/2023)   PRAPARE - Administrator, Civil Service (Medical): No    Lack of Transportation (Non-Medical): No  Physical Activity: Inactive (02/01/2023)   Exercise Vital Sign    Days of Exercise per Week: 0 days    Minutes of Exercise per Session: 0 min  Stress: No Stress Concern Present (02/01/2023)   [employer] of Occupational Health - Occupational Stress Questionnaire    Feeling of Stress : Not at all  Recent Concern: Stress - Stress Concern Present (02/01/2023)   [employer] of Occupational Health - Occupational Stress Questionnaire    Feeling of Stress : To some extent  Social Connections: Socially  Integrated (02/01/2023)   Social Connection and Isolation Panel [NHANES]    Frequency of Communication with Friends and Family: More than three times a week    Frequency of Social Gatherings with Friends and Family: More than three times a week    Attends Religious Services: More than 4 times per year    Active Member of [employer] or Organizations: Yes    Attends Engineer, structural: More than 4 times per year    Marital Status: Married  Recent Concern: Social Connections - Moderately Isolated (01/11/2023)   Social Connection and Isolation Panel [NHANES]    Frequency of Communication with Friends and Family: Once a week    Frequency of Social Gatherings with Friends and Family: Never    Attends Religious Services: 1 to 4 times per year    Active Member of [employer] or Organizations: No    Attends Engineer, structural: Never    Marital Status: Married    Tobacco Counseling Counseling given: Not Answered   Clinical Intake:  Pre-visit preparation completed: Yes  Pain : No/denies pain     BMI - recorded: 30.13 Nutritional Status: BMI 25 -29 Overweight Nutritional Risks: None Diabetes: No  How often do you need to have someone help you when you read instructions, pamphlets, or other written materials from your  doctor or pharmacy?: 1 - Never  Diabetic?   No  Interpreter Needed?: No  Comments: Infomation received by spouse Casimiro Needle Information entered by :: Kandis Cocking, CMA   Activities of Daily Living    02/01/2023   10:01 AM 01/31/2023    9:14 AM  In your present state of health, do you have any difficulty performing the following activities:  Hearing? 1 1  Vision? 0 0  Difficulty concentrating or making decisions? 0 0  Walking or climbing stairs? 1 1  Dressing or bathing? 0 0  Doing errands, shopping? 1 1  Preparing Food and eating ? N N  Using the Toilet? N N  In the past six months, have you accidently leaked urine? N Y  Do you have problems with loss  of bowel control? N N  Managing your Medications? N N  Managing your Finances? N N  Housekeeping or managing your Housekeeping? N N    Patient Care Team: Plotnikov, Georgina Quint, MD as PCP - General (Internal Medicine) Chilton Si, MD as PCP - Cardiology (Cardiology) Johney Maine, MD as Consulting Physician (Hematology) McKenzie, Mardene Celeste, MD as Consulting Physician (Urology) Lynann Bologna, MD as Consulting Physician (Gastroenterology) Chilton Si, MD as Attending Physician (Cardiology) Albin Felling, OD as Consulting Physician (Optometry) Szabat, Vinnie Level, [facility] (Inactive) as Pharmacist (Pharmacist) Aris Lot, MD as Consulting Physician (Dermatology) Sable Feil, MD (Hematology and Oncology)  Indicate any recent Medical Services you may have received from other than [facility] providers in the past year (date may be approximate).     Assessment:   This is a routine wellness examination for [place].  Hearing/Vision screen Hearing Screening - Comments:: Patient has bilateral hearing aids Vision Screening - Comments:: Up to date with routine eye exams [facility]  Dietary issues and exercise activities discussed: Current Exercise Habits: Home exercise routine, Exercise limited by: None identified   Goals Addressed   None   Depression Screen    02/01/2023    8:36 AM 01/11/2023    5:37 PM 11/23/2022   11:36 AM 08/23/2022   11:38 AM 06/30/2022    1:53 PM 06/02/2022    1:55 PM 05/24/2022    1:56 PM  PHQ 2/9 Scores  PHQ - 2 Score 0 0 0 0 0 1 0  PHQ- 9 Score    0       Fall Risk    02/01/2023    8:29 AM 01/31/2023    9:14 AM 01/11/2023    5:36 PM 01/06/2023    4:57 PM 11/23/2022   11:35 AM  Fall Risk   Falls in the past year? 1 1 1 1 1   Number falls in past yr: 1 1 1 1  0  Injury with Fall? 1 1 1 1  0  Comment scrape on elbow    SCALP SLIT open  Risk for fall due to : History of fall(s)  History of fall(s);Impaired  balance/gait;Impaired mobility  No Fall Risks  Follow up   Falls prevention discussed;Education provided;Falls evaluation completed  Falls evaluation completed    FALL RISK PREVENTION PERTAINING TO THE HOME:  Any stairs in or around the home? Yes  If so, are there any without handrails? No  Home free of loose throw rugs in walkways, pet beds, electrical cords, etc? Yes  Adequate lighting in your home to reduce risk of falls? Yes   ASSISTIVE DEVICES UTILIZED TO PREVENT FALLS:  Life alert? No  Use of a  cane, walker or w/c? Yes  Grab bars in the bathroom? Yes  Shower chair or bench in shower? Yes  Elevated toilet seat or a handicapped toilet? Yes   TIMED UP AND GO:  Was the test performed? No .   televisit   Cognitive Function:        01/11/2023    5:39 PM  6CIT Screen  What Year? 0 points  What month? 0 points  What time? 0 points  Count back from 20 0 points  Months in reverse 0 points  Repeat phrase 0 points  Total Score 0 points    Immunizations Immunization History  Administered Date(s) Administered   Fluad Quad(high Dose 65+) 06/26/2020, 07/08/2021   Influenza Split 07/05/2013   Influenza, High Dose Seasonal PF 07/12/2015, 07/29/2015, 04/28/2017, 08/16/2017, 09/06/2018   Influenza,inj,Quad PF,6+ Mos 07/05/2013, 07/17/2018   Influenza-Unspecified 07/07/2022   PFIZER(Purple Top)SARS-COV-2 Vaccination 12/04/2019, 12/25/2019, 06/05/2020, 01/23/2021, 08/06/2021, 07/01/2022   PPD Test 03/19/2016, 11/05/2016, 11/18/2016   Pneumococcal Conjugate-13 12/03/2015   Pneumococcal Polysaccharide-23 10/11/2009, 08/07/2015, 04/28/2017, 08/16/2017   Respiratory Syncytial Virus Vaccine,Recomb Aduvanted(Arexvy) 11/18/2022   Td 10/11/2012   Td (Adult),5 Lf Tetanus Toxid, Preservative Free 10/11/2012   Tdap 06/17/2022    TDAP status: Up to date  Flu Vaccine status: Up to date  Pneumococcal vaccine status: Up to date  Covid-19 vaccine status: Information provided on how to  obtain vaccines.   Qualifies for Shingles Vaccine? Yes   Zostavax completed No   Shingrix Completed?: No.    Education has been provided regarding the importance of this vaccine. Patient has been advised to call insurance company to determine out of pocket expense if they have not yet received this vaccine. Advised may also receive vaccine at local pharmacy or Health Dept. Verbalized acceptance and understanding.  Screening Tests Health Maintenance  Topic Date Due   COVID-19 Vaccine (7 - 2023-24 season) 08/26/2022   Lung Cancer Screening  04/23/2023   INFLUENZA VACCINE  05/12/2023   Medicare Annual Wellness (AWV)  02/01/2024   DTaP/Tdap/Td (4 - Td or Tdap) 06/17/2032   Pneumonia Vaccine 25+ Years old  Completed   Hepatitis C Screening  Completed   HPV VACCINES  Aged Out   Zoster Vaccines- Shingrix  Discontinued    Health Maintenance  Health Maintenance Due  Topic Date Due   COVID-19 Vaccine (7 - 2023-24 season) 08/26/2022    Colorectal cancer screening: No longer required.   Lung Cancer Screening: (Low Dose CT Chest recommended if Age 69-80 years, 30 pack-year currently smoking OR have quit w/in 15years.) does qualify.   Lung Cancer Screening Referral: N/A  Additional Screening:  Hepatitis C Screening: does qualify; Completed 06/08/2018  Vision Screening: Recommended annual ophthalmology exams for early detection of glaucoma and other disorders of the eye. Is the patient up to date with their annual eye exam?  Yes   Who is the provider or what is the name of the office in which the patient attends annual eye exams? Dr. Lew Dawes  If pt is not established with a provider, would they like to be referred to a provider to establish care? No .   Dental Screening: Recommended annual dental exams for proper oral hygiene  Community Resource Referral / Chronic Care Management: CRR required this visit?  No   CCM required this visit?  No      Plan:     I have personally  reviewed and noted the following in the patient's chart:   Medical and social history  Use of alcohol, tobacco or illicit drugs  Current medications and supplements including opioid prescriptions. Patient is not currently taking opioid prescriptions. Functional ability and status Nutritional status Physical activity Advanced directives List of other physicians Hospitalizations, surgeries, and ER visits in previous 12 months Vitals Screenings to include cognitive, depression, and falls Referrals and appointments  In addition, I have reviewed and discussed with patient certain preventive protocols, quality metrics, and best practice recommendations. A written personalized care plan for preventive services as well as general preventive health recommendations were provided to patient.     Milus Mallick, CMA   02/01/2023   Nurse Notes:  Discussed Lung screening, updated Covid vaccine   Medical screening examination/treatment/procedure(s) were performed by non-physician practitioner and as supervising physician I was immediately available for consultation/collaboration.  I agree with above. Jacinta Shoe, MD

## 2023-02-01 NOTE — Patient Instructions (Signed)
[name] , Thank you for taking time to come for your Medicare Wellness Visit. I appreciate your ongoing commitment to your health goals. Please review the following plan we discussed and let me know if I can assist you in the future.   These are the goals we discussed:  Goals       Manage My Medicine      Timeframe:  Long-Range Goal Priority:  Medium Start Date:      01/13/2022                       Expected End Date:    01/14/2023                   Follow Up Date 07/2022   - call for medicine refill 2 or 3 days before it runs out - call if I am sick and can't take my medicine - keep a list of all the medicines I take; vitamins and herbals too - learn to read medicine labels - use a pillbox to sort medicine    Why is this important?   These steps will help you keep on track with your medicines.   Notes:       Patient has support from spouse. Patient has weekly nurse visits at present.      Care Coordination Interventions:  Active listening / Reflection utilized  Described Care Coordination program support with Rush Farmer, spouse of client Reviewed care needs of client. [name] said client has nurse who visits client weekly at this time.  [name] helps transport client to and from client medical appointments Reviewed medication procurement of client Rush Farmer was glad to hear of this program, will share information on program with client, and said program may be something that client  could benefit from in the future       Patient Stated (pt-stated)      My goal is continue to walk to strengthen my legs and get back to being active.      Patient Stated (pt-stated)      Continue to monitor weight and stay physically active.      Prevent Falls and Broken Bones-Osteoporosis      Timeframe:  Long-Range Goal Priority:  High Start Date:  07/15/2021                          Expected End Date: 01/13/2022                      Follow Up Date 01/13/2022   - always use  handrails on the stairs - always wear shoes or slippers with non-slip sole - get at least 10 minutes of activity every day - keep cell phone with me always - make an emergency alert plan in case I fall - pick up clutter from the floors - remove, or use a non-slip pad, with my throw rugs - use a nightlight in the bathroom - wear low heeled or flat shoes with non-skid soles    Why is this important?   When you fall, there are 3 things that control if a bone breaks or not.  These are the fall itself, how hard and the direction that you fall and how fragile your bones are.  Preventing falls is very important for you because of fragile bones.        Track and Manage My Blood Pressure / Heart  Rate-Hypertension/CHF/Afib      Timeframe:  Long-Range Goal Priority:  High Start Date:  07/15/2021                          Expected End Date: 01/13/2022                      Follow Up Date 01/13/2022   - check blood pressure weekly - choose a place to take my blood pressure (home, clinic or office, retail store) - write blood pressure results in a log or diary    Why is this important?   You won't feel high blood pressure, but it can still hurt your blood vessels.  High blood pressure can cause heart or kidney problems. It can also cause a stroke.  Making lifestyle changes like losing a little weight or eating less salt will help.  Checking your blood pressure at home and at different times of the day can help to control blood pressure.  If the doctor prescribes medicine remember to take it the way the doctor ordered.  Call the office if you cannot afford the medicine or if there are questions about it.          This is a list of the screening recommended for you and due dates:  Health Maintenance  Topic Date Due   COVID-19 Vaccine (7 - 2023-24 season) 08/26/2022   Screening for Lung Cancer  04/23/2023   Flu Shot  05/12/2023   Medicare Annual Wellness Visit  02/01/2024   DTaP/Tdap/Td vaccine  (4 - Td or Tdap) 06/17/2032   Pneumonia Vaccine  Completed   Hepatitis C Screening: USPSTF Recommendation to screen - Ages 55-79 yo.  Completed   HPV Vaccine  Aged Out   Zoster (Shingles) Vaccine  Discontinued    Advanced directives: Discussed copy can be obtained in office  Conditions/risks identified: Keep up the good work  Next appointment: Follow up in one year for your annual wellness visit. 02/02/2024  Preventive Care 65 Years and Older, Male  Preventive care refers to lifestyle choices and visits with your health care provider that can promote health and wellness. What does preventive care include? A yearly physical exam. This is also called an annual well check. Dental exams once or twice a year. Routine eye exams. Ask your health care provider how often you should have your eyes checked. Personal lifestyle choices, including: Daily care of your teeth and gums. Regular physical activity. Eating a healthy diet. Avoiding tobacco and drug use. Limiting alcohol use. Practicing safe sex. Taking low doses of aspirin every day. Taking vitamin and mineral supplements as recommended by your health care provider. What happens during an annual well check? The services and screenings done by your health care provider during your annual well check will depend on your age, overall health, lifestyle risk factors, and family history of disease. Counseling  Your health care provider may ask you questions about your: Alcohol use. Tobacco use. Drug use. Emotional well-being. Home and relationship well-being. Sexual activity. Eating habits. History of falls. Memory and ability to understand (cognition). Work and work Astronomer. Screening  You may have the following tests or measurements: Height, weight, and BMI. Blood pressure. Lipid and cholesterol levels. These may be checked every 5 years, or more frequently if you are over 52 years old. Skin check. Lung cancer screening. You  may have this screening every year starting at age 23 if you have  a 30-pack-year history of smoking and currently smoke or have quit within the past 15 years. Fecal occult blood test (FOBT) of the stool. You may have this test every year starting at age 72. Flexible sigmoidoscopy or colonoscopy. You may have a sigmoidoscopy every 5 years or a colonoscopy every 10 years starting at age 30. Prostate cancer screening. Recommendations will vary depending on your family history and other risks. Hepatitis C blood test. Hepatitis B blood test. Sexually transmitted disease (STD) testing. Diabetes screening. This is done by checking your blood sugar (glucose) after you have not eaten for a while (fasting). You may have this done every 1-3 years. Abdominal aortic aneurysm (AAA) screening. You may need this if you are a current or former smoker. Osteoporosis. You may be screened starting at age 42 if you are at high risk. Talk with your health care provider about your test results, treatment options, and if necessary, the need for more tests. Vaccines  Your health care provider may recommend certain vaccines, such as: Influenza vaccine. This is recommended every year. Tetanus, diphtheria, and acellular pertussis (Tdap, Td) vaccine. You may need a Td booster every 10 years. Zoster vaccine. You may need this after age 36. Pneumococcal 13-valent conjugate (PCV13) vaccine. One dose is recommended after age 66. Pneumococcal polysaccharide (PPSV23) vaccine. One dose is recommended after age 44. Talk to your health care provider about which screenings and vaccines you need and how often you need them. This information is not intended to replace advice given to you by your health care provider. Make sure you discuss any questions you have with your health care provider. Document Released: 10/24/2015 Document Revised: 06/16/2016 Document Reviewed: 07/29/2015 Elsevier Interactive Patient Education  2017 [employer].  Fall Prevention in the Home Falls can cause injuries. They can happen to people of all ages. There are many things you can do to make your home safe and to help prevent falls. What can I do on the outside of my home? Regularly fix the edges of walkways and driveways and fix any cracks. Remove anything that might make you trip as you walk through a door, such as a raised step or threshold. Trim any bushes or trees on the path to your home. Use bright outdoor lighting. Clear any walking paths of anything that might make someone trip, such as rocks or tools. Regularly check to see if handrails are loose or broken. Make sure that both sides of any steps have handrails. Any raised decks and porches should have guardrails on the edges. Have any leaves, snow, or ice cleared regularly. Use sand or salt on walking paths during winter. Clean up any spills in your garage right away. This includes oil or grease spills. What can I do in the bathroom? Use night lights. Install grab bars by the toilet and in the tub and shower. Do not use towel bars as grab bars. Use non-skid mats or decals in the tub or shower. If you need to sit down in the shower, use a plastic, non-slip stool. Keep the floor dry. Clean up any water that spills on the floor as soon as it happens. Remove soap buildup in the tub or shower regularly. Attach bath mats securely with double-sided non-slip rug tape. Do not have throw rugs and other things on the floor that can make you trip. What can I do in the bedroom? Use night lights. Make sure that you have a light by your bed that is easy to reach.  Do not use any sheets or blankets that are too big for your bed. They should not hang down onto the floor. Have a firm chair that has side arms. You can use this for support while you get dressed. Do not have throw rugs and other things on the floor that can make you trip. What can I do in the kitchen? Clean up any spills right  away. Avoid walking on wet floors. Keep items that you use a lot in easy-to-reach places. If you need to reach something above you, use a strong step stool that has a grab bar. Keep electrical cords out of the way. Do not use floor polish or wax that makes floors slippery. If you must use wax, use non-skid floor wax. Do not have throw rugs and other things on the floor that can make you trip. What can I do with my stairs? Do not leave any items on the stairs. Make sure that there are handrails on both sides of the stairs and use them. Fix handrails that are broken or loose. Make sure that handrails are as long as the stairways. Check any carpeting to make sure that it is firmly attached to the stairs. Fix any carpet that is loose or worn. Avoid having throw rugs at the top or bottom of the stairs. If you do have throw rugs, attach them to the floor with carpet tape. Make sure that you have a light switch at the top of the stairs and the bottom of the stairs. If you do not have them, ask someone to add them for you. What else can I do to help prevent falls? Wear shoes that: Do not have high heels. Have rubber bottoms. Are comfortable and fit you well. Are closed at the toe. Do not wear sandals. If you use a stepladder: Make sure that it is fully opened. Do not climb a closed stepladder. Make sure that both sides of the stepladder are locked into place. Ask someone to hold it for you, if possible. Clearly mark and make sure that you can see: Any grab bars or handrails. First and last steps. Where the edge of each step is. Use tools that help you move around (mobility aids) if they are needed. These include: Canes. Walkers. Scooters. Crutches. Turn on the lights when you go into a dark area. Replace any light bulbs as soon as they burn out. Set up your furniture so you have a clear path. Avoid moving your furniture around. If any of your floors are uneven, fix them. If there are any  pets around you, be aware of where they are. Review your medicines with your doctor. Some medicines can make you feel dizzy. This can increase your chance of falling. Ask your doctor what other things that you can do to help prevent falls. This information is not intended to replace advice given to you by your health care provider. Make sure you discuss any questions you have with your health care provider. Document Released: 07/24/2009 Document Revised: 03/04/2016 Document Reviewed: 11/01/2014 Elsevier Interactive Patient Education  2017 [employer].

## 2023-02-02 ENCOUNTER — Other Ambulatory Visit: Payer: Self-pay

## 2023-02-07 DIAGNOSIS — B351 Tinea unguium: Secondary | ICD-10-CM | POA: Diagnosis not present

## 2023-02-07 DIAGNOSIS — I872 Venous insufficiency (chronic) (peripheral): Secondary | ICD-10-CM | POA: Diagnosis not present

## 2023-02-07 DIAGNOSIS — I739 Peripheral vascular disease, unspecified: Secondary | ICD-10-CM | POA: Diagnosis not present

## 2023-02-08 ENCOUNTER — Encounter: Payer: Self-pay | Admitting: Internal Medicine

## 2023-02-09 DIAGNOSIS — Z9221 Personal history of antineoplastic chemotherapy: Secondary | ICD-10-CM | POA: Diagnosis not present

## 2023-02-09 DIAGNOSIS — C8330 Diffuse large B-cell lymphoma, unspecified site: Secondary | ICD-10-CM | POA: Diagnosis not present

## 2023-02-09 DIAGNOSIS — R197 Diarrhea, unspecified: Secondary | ICD-10-CM | POA: Diagnosis not present

## 2023-02-09 DIAGNOSIS — Z5111 Encounter for antineoplastic chemotherapy: Secondary | ICD-10-CM | POA: Diagnosis not present

## 2023-02-14 DIAGNOSIS — J3089 Other allergic rhinitis: Secondary | ICD-10-CM | POA: Diagnosis not present

## 2023-02-14 DIAGNOSIS — J301 Allergic rhinitis due to pollen: Secondary | ICD-10-CM | POA: Diagnosis not present

## 2023-02-14 DIAGNOSIS — J3081 Allergic rhinitis due to animal (cat) (dog) hair and dander: Secondary | ICD-10-CM | POA: Diagnosis not present

## 2023-02-15 DIAGNOSIS — F410 Panic disorder [episodic paroxysmal anxiety] without agoraphobia: Secondary | ICD-10-CM | POA: Diagnosis not present

## 2023-02-15 DIAGNOSIS — F331 Major depressive disorder, recurrent, moderate: Secondary | ICD-10-CM | POA: Diagnosis not present

## 2023-02-18 DIAGNOSIS — Z87891 Personal history of nicotine dependence: Secondary | ICD-10-CM | POA: Diagnosis not present

## 2023-02-18 DIAGNOSIS — Z8589 Personal history of malignant neoplasm of other organs and systems: Secondary | ICD-10-CM | POA: Diagnosis not present

## 2023-02-18 DIAGNOSIS — M810 Age-related osteoporosis without current pathological fracture: Secondary | ICD-10-CM | POA: Diagnosis not present

## 2023-02-18 DIAGNOSIS — N400 Enlarged prostate without lower urinary tract symptoms: Secondary | ICD-10-CM | POA: Diagnosis not present

## 2023-02-18 DIAGNOSIS — M461 Sacroiliitis, not elsewhere classified: Secondary | ICD-10-CM | POA: Diagnosis not present

## 2023-02-18 DIAGNOSIS — J3089 Other allergic rhinitis: Secondary | ICD-10-CM | POA: Diagnosis not present

## 2023-02-18 DIAGNOSIS — Z87440 Personal history of urinary (tract) infections: Secondary | ICD-10-CM | POA: Diagnosis not present

## 2023-02-18 DIAGNOSIS — Z6830 Body mass index (BMI) 30.0-30.9, adult: Secondary | ICD-10-CM | POA: Diagnosis not present

## 2023-02-18 DIAGNOSIS — E669 Obesity, unspecified: Secondary | ICD-10-CM | POA: Diagnosis not present

## 2023-02-18 DIAGNOSIS — G4733 Obstructive sleep apnea (adult) (pediatric): Secondary | ICD-10-CM | POA: Diagnosis not present

## 2023-02-18 DIAGNOSIS — Z86718 Personal history of other venous thrombosis and embolism: Secondary | ICD-10-CM | POA: Diagnosis not present

## 2023-02-18 DIAGNOSIS — E785 Hyperlipidemia, unspecified: Secondary | ICD-10-CM | POA: Diagnosis not present

## 2023-02-18 DIAGNOSIS — J301 Allergic rhinitis due to pollen: Secondary | ICD-10-CM | POA: Diagnosis not present

## 2023-02-18 DIAGNOSIS — M1990 Unspecified osteoarthritis, unspecified site: Secondary | ICD-10-CM | POA: Diagnosis not present

## 2023-02-18 DIAGNOSIS — Z9181 History of falling: Secondary | ICD-10-CM | POA: Diagnosis not present

## 2023-02-18 DIAGNOSIS — G9520 Unspecified cord compression: Secondary | ICD-10-CM | POA: Diagnosis not present

## 2023-02-18 DIAGNOSIS — F411 Generalized anxiety disorder: Secondary | ICD-10-CM | POA: Diagnosis not present

## 2023-02-18 DIAGNOSIS — K219 Gastro-esophageal reflux disease without esophagitis: Secondary | ICD-10-CM | POA: Diagnosis not present

## 2023-02-18 DIAGNOSIS — I25119 Atherosclerotic heart disease of native coronary artery with unspecified angina pectoris: Secondary | ICD-10-CM | POA: Diagnosis not present

## 2023-02-18 DIAGNOSIS — N529 Male erectile dysfunction, unspecified: Secondary | ICD-10-CM | POA: Diagnosis not present

## 2023-02-23 DIAGNOSIS — Z5111 Encounter for antineoplastic chemotherapy: Secondary | ICD-10-CM | POA: Diagnosis not present

## 2023-02-23 DIAGNOSIS — R197 Diarrhea, unspecified: Secondary | ICD-10-CM | POA: Diagnosis not present

## 2023-02-23 DIAGNOSIS — C8330 Diffuse large B-cell lymphoma, unspecified site: Secondary | ICD-10-CM | POA: Diagnosis not present

## 2023-02-23 DIAGNOSIS — Z9221 Personal history of antineoplastic chemotherapy: Secondary | ICD-10-CM | POA: Diagnosis not present

## 2023-02-25 DIAGNOSIS — H903 Sensorineural hearing loss, bilateral: Secondary | ICD-10-CM | POA: Insufficient documentation

## 2023-02-25 NOTE — Telephone Encounter (Signed)
Patient was seen 4/8 by Ronn Melena NP

## 2023-03-09 DIAGNOSIS — C8330 Diffuse large B-cell lymphoma, unspecified site: Secondary | ICD-10-CM | POA: Diagnosis not present

## 2023-03-09 DIAGNOSIS — Z5111 Encounter for antineoplastic chemotherapy: Secondary | ICD-10-CM | POA: Diagnosis not present

## 2023-03-09 DIAGNOSIS — Z9221 Personal history of antineoplastic chemotherapy: Secondary | ICD-10-CM | POA: Diagnosis not present

## 2023-03-09 DIAGNOSIS — R197 Diarrhea, unspecified: Secondary | ICD-10-CM | POA: Diagnosis not present

## 2023-03-15 DIAGNOSIS — F331 Major depressive disorder, recurrent, moderate: Secondary | ICD-10-CM | POA: Diagnosis not present

## 2023-03-15 DIAGNOSIS — F410 Panic disorder [episodic paroxysmal anxiety] without agoraphobia: Secondary | ICD-10-CM | POA: Diagnosis not present

## 2023-03-16 DIAGNOSIS — J3089 Other allergic rhinitis: Secondary | ICD-10-CM | POA: Diagnosis not present

## 2023-03-16 DIAGNOSIS — J301 Allergic rhinitis due to pollen: Secondary | ICD-10-CM | POA: Diagnosis not present

## 2023-03-21 DIAGNOSIS — J3081 Allergic rhinitis due to animal (cat) (dog) hair and dander: Secondary | ICD-10-CM | POA: Diagnosis not present

## 2023-03-21 DIAGNOSIS — J301 Allergic rhinitis due to pollen: Secondary | ICD-10-CM | POA: Diagnosis not present

## 2023-03-21 DIAGNOSIS — J3089 Other allergic rhinitis: Secondary | ICD-10-CM | POA: Diagnosis not present

## 2023-03-23 DIAGNOSIS — Z9221 Personal history of antineoplastic chemotherapy: Secondary | ICD-10-CM | POA: Diagnosis not present

## 2023-03-23 DIAGNOSIS — C8330 Diffuse large B-cell lymphoma, unspecified site: Secondary | ICD-10-CM | POA: Diagnosis not present

## 2023-03-23 DIAGNOSIS — Z5111 Encounter for antineoplastic chemotherapy: Secondary | ICD-10-CM | POA: Diagnosis not present

## 2023-03-23 DIAGNOSIS — T82898A Other specified complication of vascular prosthetic devices, implants and grafts, initial encounter: Secondary | ICD-10-CM | POA: Diagnosis not present

## 2023-03-28 DIAGNOSIS — J3081 Allergic rhinitis due to animal (cat) (dog) hair and dander: Secondary | ICD-10-CM | POA: Diagnosis not present

## 2023-03-28 DIAGNOSIS — J3089 Other allergic rhinitis: Secondary | ICD-10-CM | POA: Diagnosis not present

## 2023-03-28 DIAGNOSIS — J301 Allergic rhinitis due to pollen: Secondary | ICD-10-CM | POA: Diagnosis not present

## 2023-04-04 DIAGNOSIS — J3081 Allergic rhinitis due to animal (cat) (dog) hair and dander: Secondary | ICD-10-CM | POA: Diagnosis not present

## 2023-04-04 DIAGNOSIS — J3089 Other allergic rhinitis: Secondary | ICD-10-CM | POA: Diagnosis not present

## 2023-04-04 DIAGNOSIS — J301 Allergic rhinitis due to pollen: Secondary | ICD-10-CM | POA: Diagnosis not present

## 2023-04-06 DIAGNOSIS — T82898A Other specified complication of vascular prosthetic devices, implants and grafts, initial encounter: Secondary | ICD-10-CM | POA: Diagnosis not present

## 2023-04-06 DIAGNOSIS — Z5111 Encounter for antineoplastic chemotherapy: Secondary | ICD-10-CM | POA: Diagnosis not present

## 2023-04-06 DIAGNOSIS — Z9221 Personal history of antineoplastic chemotherapy: Secondary | ICD-10-CM | POA: Diagnosis not present

## 2023-04-06 DIAGNOSIS — C8330 Diffuse large B-cell lymphoma, unspecified site: Secondary | ICD-10-CM | POA: Diagnosis not present

## 2023-04-11 DIAGNOSIS — J301 Allergic rhinitis due to pollen: Secondary | ICD-10-CM | POA: Diagnosis not present

## 2023-04-11 DIAGNOSIS — J3089 Other allergic rhinitis: Secondary | ICD-10-CM | POA: Diagnosis not present

## 2023-04-11 DIAGNOSIS — J3081 Allergic rhinitis due to animal (cat) (dog) hair and dander: Secondary | ICD-10-CM | POA: Diagnosis not present

## 2023-04-13 DIAGNOSIS — N401 Enlarged prostate with lower urinary tract symptoms: Secondary | ICD-10-CM | POA: Diagnosis not present

## 2023-04-13 DIAGNOSIS — R3914 Feeling of incomplete bladder emptying: Secondary | ICD-10-CM | POA: Diagnosis not present

## 2023-04-18 DIAGNOSIS — J3089 Other allergic rhinitis: Secondary | ICD-10-CM | POA: Diagnosis not present

## 2023-04-18 DIAGNOSIS — J3081 Allergic rhinitis due to animal (cat) (dog) hair and dander: Secondary | ICD-10-CM | POA: Diagnosis not present

## 2023-04-18 DIAGNOSIS — J301 Allergic rhinitis due to pollen: Secondary | ICD-10-CM | POA: Diagnosis not present

## 2023-04-19 ENCOUNTER — Emergency Department (HOSPITAL_BASED_OUTPATIENT_CLINIC_OR_DEPARTMENT_OTHER)
Admission: EM | Admit: 2023-04-19 | Discharge: 2023-04-19 | Disposition: A | Discharge status: Home / Self Care | Payer: Medicare PPO | Attending: Emergency Medicine | Admitting: Emergency Medicine

## 2023-04-19 ENCOUNTER — Other Ambulatory Visit: Payer: Self-pay

## 2023-04-19 ENCOUNTER — Encounter (HOSPITAL_BASED_OUTPATIENT_CLINIC_OR_DEPARTMENT_OTHER): Payer: Self-pay | Admitting: Emergency Medicine

## 2023-04-19 DIAGNOSIS — N3090 Cystitis, unspecified without hematuria: Secondary | ICD-10-CM | POA: Diagnosis not present

## 2023-04-19 DIAGNOSIS — R197 Diarrhea, unspecified: Secondary | ICD-10-CM | POA: Diagnosis not present

## 2023-04-19 DIAGNOSIS — I110 Hypertensive heart disease with heart failure: Secondary | ICD-10-CM | POA: Insufficient documentation

## 2023-04-19 DIAGNOSIS — Z7901 Long term (current) use of anticoagulants: Secondary | ICD-10-CM | POA: Insufficient documentation

## 2023-04-19 DIAGNOSIS — I5020 Unspecified systolic (congestive) heart failure: Secondary | ICD-10-CM | POA: Diagnosis not present

## 2023-04-19 DIAGNOSIS — Z79899 Other long term (current) drug therapy: Secondary | ICD-10-CM | POA: Insufficient documentation

## 2023-04-19 DIAGNOSIS — I2510 Atherosclerotic heart disease of native coronary artery without angina pectoris: Secondary | ICD-10-CM | POA: Insufficient documentation

## 2023-04-19 DIAGNOSIS — N390 Urinary tract infection, site not specified: Secondary | ICD-10-CM | POA: Diagnosis present

## 2023-04-19 LAB — CBC WITH DIFFERENTIAL/PLATELET
Abs Immature Granulocytes: 0.11 10*3/uL — ABNORMAL HIGH (ref 0.00–0.07)
Basophils Absolute: 0 10*3/uL (ref 0.0–0.1)
Basophils Relative: 1 %
Eosinophils Absolute: 0.2 10*3/uL (ref 0.0–0.5)
Eosinophils Relative: 2 %
HCT: 41.7 % (ref 39.0–52.0)
Hemoglobin: 13.4 g/dL (ref 13.0–17.0)
Immature Granulocytes: 2 %
Lymphocytes Relative: 18 %
Lymphs Abs: 1.2 10*3/uL (ref 0.7–4.0)
MCH: 29.5 pg (ref 26.0–34.0)
MCHC: 32.1 g/dL (ref 30.0–36.0)
MCV: 91.9 fL (ref 80.0–100.0)
Monocytes Absolute: 0.6 10*3/uL (ref 0.1–1.0)
Monocytes Relative: 9 %
Neutro Abs: 4.5 10*3/uL (ref 1.7–7.7)
Neutrophils Relative %: 68 %
Platelets: 188 10*3/uL (ref 150–400)
RBC: 4.54 MIL/uL (ref 4.22–5.81)
RDW: 18.1 % — ABNORMAL HIGH (ref 11.5–15.5)
WBC: 6.5 10*3/uL (ref 4.0–10.5)
nRBC: 0 % (ref 0.0–0.2)

## 2023-04-19 LAB — COMPREHENSIVE METABOLIC PANEL
ALT: 15 U/L (ref 0–44)
AST: 21 U/L (ref 15–41)
Albumin: 3.7 g/dL (ref 3.5–5.0)
Alkaline Phosphatase: 66 U/L (ref 38–126)
Anion gap: 7 (ref 5–15)
BUN: 26 mg/dL — ABNORMAL HIGH (ref 8–23)
CO2: 26 mmol/L (ref 22–32)
Calcium: 9.3 mg/dL (ref 8.9–10.3)
Chloride: 100 mmol/L (ref 98–111)
Creatinine, Ser: 1.65 mg/dL — ABNORMAL HIGH (ref 0.61–1.24)
GFR, Estimated: 43 mL/min — ABNORMAL LOW (ref >60)
Glucose, Bld: 90 mg/dL (ref 70–99)
Potassium: 4.5 mmol/L (ref 3.5–5.1)
Sodium: 133 mmol/L — ABNORMAL LOW (ref 135–145)
Total Bilirubin: 0.9 mg/dL (ref 0.3–1.2)
Total Protein: 6.1 g/dL — ABNORMAL LOW (ref 6.5–8.1)

## 2023-04-19 LAB — URINALYSIS, W/ REFLEX TO CULTURE (INFECTION SUSPECTED)
Bilirubin Urine: NEGATIVE
Glucose, UA: NEGATIVE mg/dL
Ketones, ur: NEGATIVE mg/dL
Nitrite: POSITIVE — AB
Specific Gravity, Urine: 1.012 (ref 1.005–1.030)
WBC, UA: >50 WBC/hpf (ref 0–5)
pH: 7 (ref 5.0–8.0)

## 2023-04-19 MED ORDER — CEFADROXIL 500 MG PO CAPS: 500.0000 mg | ORAL_CAPSULE | Freq: Two times a day (BID) | ORAL | 0 refills | Status: DC

## 2023-04-19 MED ORDER — SODIUM CHLORIDE 0.9 % IV SOLN
1.0000 g | Freq: Once | INTRAVENOUS | Status: AC
  Administered 2023-04-19: 1 g via INTRAVENOUS
  Filled 2023-04-19: qty 10

## 2023-04-19 MED ORDER — SODIUM CHLORIDE 0.9 % IV BOLUS
500.0000 mL | Freq: Once | INTRAVENOUS | Status: AC
  Administered 2023-04-19: 500 mL via INTRAVENOUS

## 2023-04-19 NOTE — ED Provider Notes (Signed)
[place] EMERGENCY DEPARTMENT AT [facility] Provider Note   CSN: [id number] Arrival date & time: 04/19/23  1118     History  Chief Complaint  Patient presents with   Urinary Tract Infection    [name] is a 77 y.o. male.   Urinary Tract Infection   77 year old male presents emergency department with complaints of urinary tract infection.  Patient reports intermittent dysuria over the past week or so.  Reports history of urinary tract infection with most recent requiring admission due to evidence of AKI concurrently.  Patient states that he has gained stability at baseline using walker outpatient ambulation.  Reports two recent falls within the past week both described as mechanical in nature.  Reports catching the edge of his walker on the edge of the door frame walking into his restroom but was able to brace falling on his bottom.  Incident occurred 3 days ago.  Reports another episode occurring earlier in the week last week when he had a tray on his walker and the tray slipped when he was applying pressure causing him to fall but he caught himself on a nearby railing and landed on his bottom.  Denies trauma to head, loss of consciousness.  Denies any new weakness/sensory deficit of lower extremities, slurred speech, facial droop, gait instability from baseline.  Denies any chest pain, shortness of breath, nausea, vomiting, fever, chills.  Patient has reported some intermittent suprapubic discomfort as well as diarrhea.  Patient reports diarrhea occurring for the past few months after beginning Epcoritamab which has recently improved over the past few weeks with use of Imodium at home.  Past medical history significant for B-cell lymphoma on Epcoritamab, osteoporosis, CAD, prostatic hyperplasia, GAD, GERD, hyperlipidemia, hypertension, atrial fibrillation on Xarelto, recurrent UTI, systolic heart failure  Home Medications Prior to Admission medications   Medication Sig  Start Date End Date Taking? Authorizing Provider  cefadroxil (DURICEF) 500 MG capsule Take 1 capsule (500 mg total) by mouth 2 (two) times daily. 04/19/23  Yes Sherian Maroon A, PA  acetaminophen (TYLENOL) 325 MG tablet Take 2 tablets (650 mg total) by mouth every 6 (six) hours as needed for mild pain (or Fever >/= 101). 03/25/22   Marguerita Merles Latif, DO  acyclovir (ZOVIRAX) 400 MG tablet Take 400 mg by mouth 2 (two) times daily. 08/11/22   [provider]  albuterol (VENTOLIN HFA) 108 (90 Base) MCG/ACT inhaler Inhale 2 puffs into the lungs every 6 (six) hours as needed for wheezing or shortness of breath.    [provider]  alfuzosin (UROXATRAL) 10 MG 24 hr tablet TAKE 1 TABLET AT BEDTIME 08/10/22   McKenzie, Mardene Celeste, MD  allopurinol (ZYLOPRIM) 100 MG tablet Take 100 mg by mouth daily.    [provider]  amoxicillin (AMOXIL) 500 MG capsule TAKE 4 CAPSULES BY MOUTH ONE HOUR PRIOR TO DENTAL APPOINTMENT/PROCEDURE. 10/26/22   [provider]  atorvastatin (LIPITOR) 80 MG tablet TAKE 1 TABLET EVERY DAY Patient taking differently: Take 80 mg by mouth at bedtime. 08/09/22   Alver Sorrow, NP  buPROPion (WELLBUTRIN XL) 300 MG 24 hr tablet TAKE 1 TABLET EVERY DAY WITH BREAKFAST Patient taking differently: Take 300 mg by mouth daily. 07/19/22   Plotnikov, Georgina Quint, MD  Calcium Citrate-Vitamin D (CALCIUM CITRATE + D PO) Take 1 tablet by mouth 2 (two) times daily. 1200mg  of Calcium and 1000 units vitamin D3    [provider]  Cholecalciferol (VITAMIN D) 50 MCG (2000 UT)  tablet Take 4,000 Units by mouth in the morning and at bedtime.    [provider]  clonazePAM (KLONOPIN) 1 MG tablet Take 1 tablet (1 mg total) by mouth 3 (three) times daily as needed for anxiety. 10/08/22   Plotnikov, Georgina Quint, MD  CRANBERRY PO Take 1 tablet by mouth in the morning and at bedtime.    [provider]  denosumab (PROLIA) 60 MG/ML SOSY injection Inject 60 mg  as directed every 6 (six) months. 12/10/15   [provider]  denosumab (PROLIA) 60 MG/ML SOSY injection Inject 60 mg into the skin every 6 (six) months.    [provider]  diltiazem (TIAZAC) 240 MG 24 hr capsule 240 mg.    [provider]  DULoxetine (CYMBALTA) 60 MG capsule TAKE 1 CAPSULE TWICE DAILY Patient taking differently: Take 60 mg by mouth 2 (two) times daily. 07/19/22   Plotnikov, Georgina Quint, MD  EPINEPHrine 0.3 mg/0.3 mL IJ SOAJ injection Inject 0.3 mg into the muscle once as needed for anaphylaxis (severe allergic reaction). 07/31/21   [provider]  ezetimibe (ZETIA) 10 MG tablet TAKE 1 TABLET EVERY DAY 09/27/22   Chilton Si, MD  feeding supplement (ENSURE ENLIVE / ENSURE PLUS) LIQD Take 237 mLs by mouth 2 (two) times daily between meals. Patient taking differently: Take 237 mLs by mouth daily. 03/25/22   Marguerita Merles Latif, DO  ferrous sulfate 325 (65 FE) MG tablet Take 325 mg by mouth 2 (two) times daily with a meal.    [provider]  lactobacillus acidophilus (BACID) TABS tablet Take 1 tablet by mouth daily.    [provider]  levothyroxine (SYNTHROID) 125 MCG tablet Take 1 tablet (125 mcg total) by mouth daily at 6 (six) AM. 08/23/22   Plotnikov, Georgina Quint, MD  lidocaine-prilocaine (EMLA) cream Apply 1 Application topically as needed. Patient taking differently: Apply 1 Application topically as needed (For port access/chemotherapy). 04/21/22   Johney Maine, MD  loratadine (CLARITIN) 10 MG tablet Take 10 mg by mouth daily.    [provider]  Magnesium 250 MG TABS Take 250 mg by mouth daily.    [provider]  metoprolol tartrate (LOPRESSOR) 25 MG tablet Take 25 mg by mouth 2 (two) times daily.    [provider]  Multiple Vitamin (MULTIVITAMIN WITH MINERALS) TABS tablet Take 1 tablet by mouth every morning. Men's One-A-Day 50+    [provider]  MYRBETRIQ 25 MG TB24 tablet  TAKE 1 TABLET EVERY DAY Patient taking differently: Take 25 mg by mouth at bedtime. 10/06/21   McKenzie, Mardene Celeste, MD  nitroGLYCERIN (NITROSTAT) 0.4 MG SL tablet Place 0.4 mg under the tongue every 5 (five) minutes as needed for chest pain.    [provider]  omeprazole (PRILOSEC) 40 MG capsule TAKE 1 CAPSULE EVERY DAY BEFORE BREAKFAST Patient taking differently: Take 40 mg by mouth daily. 07/19/22   Plotnikov, Georgina Quint, MD  Potassium Citrate 15 MEQ (1620 MG) TBCR Take 1 tablet by mouth See admin instructions. Take one tablet (15 meq) by mouth twice daily - with lunch and supper Patient taking differently: Take 1 tablet by mouth in the morning and at bedtime. 06/10/22   McKenzie, Mardene Celeste, MD  Probiotic Product (PROBIOTIC PO) Take 1 capsule by mouth daily with breakfast.    [provider]  Risankizumab-rzaa [facility]) Inject into the skin. On hold Patient not taking: Reported on 02/01/2023    [provider]  solifenacin (  VESICARE) 10 MG tablet Take 10 mg by mouth daily.    [provider]  Specialty Vitamins Products (MG PLUS PROTEIN) 133 MG TABS Take 1 tablet by mouth 3 (three) times daily. 11/22/22   [provider]  tamsulosin (FLOMAX) 0.4 MG CAPS capsule Take 1 capsule by mouth daily.    [provider]  testosterone enanthate (DELATESTRYL) 200 MG/ML injection INJECT (200MG ) INTO THE MUSCLE EVERY 14 DAYS. DISCARD VIAL AFTER 28 DAYS. Patient taking differently: Inject 200 mg into the muscle every 14 (fourteen) days. Every other Friday 09/22/21   Plotnikov, Georgina Quint, MD  vitamin C (ASCORBIC ACID) 500 MG tablet Take 500 mg by mouth daily with lunch.    [provider]      Allergies    Short ragweed pollen ext    Review of Systems   Review of Systems  All other systems reviewed and are negative.   Physical Exam Updated Vital Signs BP 124/78 (BP Location: Right Arm)   Pulse 86   Temp 98.4 F (36.9 C) (Temporal)    Resp 16   Ht 6\' 4"  (1.93 m)   Wt 108.9 kg   SpO2 99%   BMI 29.21 kg/m  Physical Exam Vitals and nursing note reviewed.  Constitutional:      General: He is not in acute distress.    Appearance: He is well-developed.  HENT:     Head: Normocephalic and atraumatic.  Eyes:     Conjunctiva/sclera: Conjunctivae normal.  Cardiovascular:     Rate and Rhythm: Normal rate. Rhythm irregular.  Pulmonary:     Effort: Pulmonary effort is normal. No respiratory distress.     Breath sounds: Normal breath sounds.  Abdominal:     Palpations: Abdomen is soft.     Tenderness: There is no abdominal tenderness. There is no right CVA tenderness, left CVA tenderness or guarding.  Musculoskeletal:        General: No swelling.     Cervical back: Neck supple.     Comments: No midline tenderness of cervical, thoracic, lumbar spine without any step-off or Forei noted.  Patient without tenderness to palpation of upper or lower extremities.  No chest wall tenderness to palpation.  Skin:    General: Skin is warm and dry.     Capillary Refill: Capillary refill takes less than 2 seconds.  Neurological:     Mental Status: He is alert.     Comments: Alert and oriented to self, place, time and event.   Speech is fluent, clear without dysarthria or dysphasia.   Strength 5/5 in upper/lower extremities   Sensation intact in upper/lower extremities   Patient able to ambulate independently with walker CN I not tested  CN II not tested CN III, IV, VI PERRLA and EOMs intact bilaterally  CN V Intact sensation to sharp and light touch to the face  CN VII facial movements symmetric  CN VIII not tested  CN IX, X no uvula deviation, symmetric rise of soft palate  CN XI 5/5 SCM and trapezius strength bilaterally  CN XII Midline tongue protrusion, symmetric L/R movements     Psychiatric:        Mood and Affect: Mood normal.     ED Results / Procedures / Treatments   Labs (all labs ordered are listed, but only  abnormal results are displayed) Labs Reviewed  URINALYSIS, W/ REFLEX TO CULTURE (INFECTION SUSPECTED) - Abnormal; Notable for the following components:      Result  Value   APPearance HAZY (*)    Hgb urine dipstick MODERATE (*)    Protein, ur TRACE (*)    Nitrite POSITIVE (*)    Leukocytes,Ua LARGE (*)    Bacteria, UA MANY (*)    All other components within normal limits  COMPREHENSIVE METABOLIC PANEL - Abnormal; Notable for the following components:   Sodium 133 (*)    BUN 26 (*)    Creatinine, Ser 1.65 (*)    Total Protein 6.1 (*)    GFR, Estimated 43 (*)    All other components within normal limits  CBC WITH DIFFERENTIAL/PLATELET - Abnormal; Notable for the following components:   RDW 18.1 (*)    Abs Immature Granulocytes 0.11 (*)    All other components within normal limits  URINE CULTURE    EKG None  Radiology No results found.  Procedures Procedures    Medications Ordered in ED Medications  sodium chloride 0.9 % bolus 500 mL (0 mLs Intravenous Stopped 04/19/23 1404)  cefTRIAXone (ROCEPHIN) 1 g in sodium chloride 0.9 % 100 mL IVPB (0 g Intravenous Stopped 04/19/23 1404)    ED Course/ Medical Decision Making/ A&P                             Medical Decision Making Amount and/or Complexity of Data Reviewed Labs: ordered.   This patient presents to the ED for concern of urinary symptoms, this involves an extensive number of treatment options, and is a complaint that carries with it a high risk of complications and morbidity.  The differential diagnosis includes urinary tract infection, pyelonephritis, nephrolithiasis, sepsis   Co morbidities that complicate the patient evaluation  See HPI   Additional history obtained:  Additional history obtained from EMR External records from outside source obtained and reviewed including hospital records   Lab Tests:  I Ordered, and personally interpreted labs.  The pertinent results include: UA significant for large  leukocytes, positive nitrite, greater than 50 WBCs with many bacteria indicative of urinary tract infection.  Patient also with UA consistent with moderate hemoglobin, 21-50 RBCs.  No leukocytosis.  No evidence anemia.  Platelets within range.  Mild hyponatremia of 133 but otherwise, electrolytes within normal limits.  No transaminitis.  Patient with evidence of mild AKI l with current creatinine of 1.63 but with ranging creatinine of 1.33-1.57 from outpatient labs over the past 2 months.  BUN anywhere from 18-28 outpatient labs for the past 2 months and currently 26.    Imaging Studies ordered:  N/a   Consultations Obtained:  N/a   Problem List / ED Course / Critical interventions / Medication management  Cystitis I ordered medication including Rocephin 500 cc normal saline   Reevaluation of the patient after these medicines showed that the patient improved I have reviewed the patients home medicines and have made adjustments as needed   Social Determinants of Health:  Former cigarette use.  Denies illicit drug use.   Test / Admission - Considered:  Cystitis Vitals signs within normal range and stable throughout visit. Laboratory studies significant for: see above 77 year old male presents emergency department with complaints of dysuria as well as fall within the past 3 days.  Regarding fall, patient's fall described as mechanical in nature with his walker getting caught on the edge of a door frame.  From a trauma perspective, patient without trauma to head, loss consciousness but is on Xarelto due to history of atrial fibrillation.  Patient's neurologic exam benign without evidence of direct head trauma or patient reported injury of the head so CT imaging of the head and neck was foregone.  Patient reported landing on his bottom and "catching his fall" without any reproducible bony tenderness on exam no overlying skin abnormalities concerning for ecchymosis, laceration, abrasions or  other abnormality indicative of traumatic mechanism.  Given lack of reported/reproducible pain or evidence of trauma from falls, imaging studies were foregone.  Regarding patient's dysuria intermittent over the past week, patient found with evidence of urinary tract infection given UA.  Patient without flank pain, significant suprapubic discomfort, fever, tachycardia, hypotension, leukocytosis.  Patient does not meet SIRS criteria so sepsis protocol was not followed.  Patient with history of recurrent UTIs with prior urine culture susceptibility to Rocephin so patient given 1 dose of Rocephin while in the ED and sent home with oral cephalosporins upon pharmacy's recommendation.  Will recommend close follow-up with urology in the outpatient setting for recurrent UTI.  Treatment plan discussed at length with patient and he acknowledged understanding was agreeable to said plan.  Patient overall well-appearing, afebrile in no acute distress. Worrisome signs and symptoms were discussed with the patient, and the patient acknowledged understanding to return to the ED if noticed. Patient was stable upon discharge.          Final Clinical Impression(s) / ED Diagnoses Final diagnoses:  Cystitis    Rx / DC Orders ED Discharge Orders          Ordered    cefadroxil (DURICEF) 500 MG capsule  2 times daily        04/19/23 1433              Peter Garter, [place] 04/19/23 1523    Maia Plan, MD 04/21/23 [phone]

## 2023-04-19 NOTE — Discharge Instructions (Addendum)
As discussed, your urine did look infected.  Take antibiotics as directed.  Recommend follow-up with urology for reassessment of your symptoms.  We have obtained a urine culture and we will call you if the bacteria does not match up with the antibiotic prescribed.  Please do not hesitate to return to emergency department if the worrisome signs and symptoms we discussed to become apparent.

## 2023-04-19 NOTE — ED Triage Notes (Signed)
Pt arrives to ED with c/o possible UTI. Pt notes he took a home UTI test which was positive. He notes dysuria.

## 2023-04-20 DIAGNOSIS — C8338 Diffuse large B-cell lymphoma, lymph nodes of multiple sites: Secondary | ICD-10-CM | POA: Diagnosis not present

## 2023-04-20 DIAGNOSIS — Z9221 Personal history of antineoplastic chemotherapy: Secondary | ICD-10-CM | POA: Diagnosis not present

## 2023-04-20 DIAGNOSIS — C8330 Diffuse large B-cell lymphoma, unspecified site: Secondary | ICD-10-CM | POA: Diagnosis not present

## 2023-04-20 DIAGNOSIS — Z5111 Encounter for antineoplastic chemotherapy: Secondary | ICD-10-CM | POA: Diagnosis not present

## 2023-04-20 LAB — URINE CULTURE

## 2023-04-21 LAB — URINE CULTURE: Culture: >=100000 — AB

## 2023-04-22 ENCOUNTER — Telehealth (HOSPITAL_BASED_OUTPATIENT_CLINIC_OR_DEPARTMENT_OTHER): Payer: Self-pay | Admitting: *Deleted

## 2023-04-22 NOTE — Telephone Encounter (Signed)
Post ED Visit - Positive Culture Follow-up  Culture report reviewed by antimicrobial stewardship pharmacist: [facility] [x]  Daylene Posey, Pharm.D. []  Celedonio Miyamoto, Pharm.D., BCPS AQ-ID []  Garvin Fila, Pharm.D., BCPS []  Georgina Pillion, Pharm.D., BCPS []  [place], [address].D., BCPS, AAHIVP []  Estella Husk, Pharm.D., BCPS, AAHIVP []  Lysle Pearl, PharmD, BCPS []  Phillips Climes, PharmD, BCPS []  Agapito Games, PharmD, BCPS []  Verlan Friends, PharmD []  Mervyn Gay, PharmD, BCPS []  Vinnie Level, PharmD  [facility] []  Len Childs, PharmD []  Greer Pickerel, PharmD []  Adalberto Cole, PharmD []  Perlie Gold, Rph []  Lonell Face) Jean Rosenthal, PharmD []  Earl Many, PharmD []  Junita Push, PharmD []  Dorna Leitz, PharmD []  Terrilee Files, PharmD []  Lynann Beaver, PharmD []  Keturah Barre, PharmD []  Loralee Pacas, PharmD []  Bernadene Person, PharmD   Positive urine culture Treated with Cefadroxil, organism sensitive to the same and no further patient follow-up is required at this time.  Virl Axe [facility] 04/22/2023, 8:45 AM

## 2023-04-25 DIAGNOSIS — F410 Panic disorder [episodic paroxysmal anxiety] without agoraphobia: Secondary | ICD-10-CM | POA: Diagnosis not present

## 2023-04-25 DIAGNOSIS — F331 Major depressive disorder, recurrent, moderate: Secondary | ICD-10-CM | POA: Diagnosis not present

## 2023-04-26 ENCOUNTER — Telehealth: Payer: Self-pay

## 2023-04-26 DIAGNOSIS — Z85828 Personal history of other malignant neoplasm of skin: Secondary | ICD-10-CM | POA: Diagnosis not present

## 2023-04-26 DIAGNOSIS — L814 Other melanin hyperpigmentation: Secondary | ICD-10-CM | POA: Diagnosis not present

## 2023-04-26 DIAGNOSIS — L400 Psoriasis vulgaris: Secondary | ICD-10-CM | POA: Diagnosis not present

## 2023-04-26 DIAGNOSIS — D225 Melanocytic nevi of trunk: Secondary | ICD-10-CM | POA: Diagnosis not present

## 2023-04-26 DIAGNOSIS — L821 Other seborrheic keratosis: Secondary | ICD-10-CM | POA: Diagnosis not present

## 2023-04-26 NOTE — Telephone Encounter (Signed)
Transition Care Management Unsuccessful Follow-up Telephone Call  Date of discharge and from where:  [facility] 7/9  Attempts:  2nd Attempt  Reason for unsuccessful TCM follow-up call:  No answer/busy   Lenard Forth [facility] Guide, [facility] [phone] [address] [place], [place], [place] [address] Phone: [phone] Email: [email or web address]

## 2023-04-26 NOTE — Telephone Encounter (Signed)
Transition Care Management Unsuccessful Follow-up Telephone Call  Date of discharge and from where:  [facility] 7/9  Attempts:  1st Attempt  Reason for unsuccessful TCM follow-up call:  No answer/busy   Lenard Forth [facility] Guide, [facility] [phone] [address] [place], [place], [place] [address] Phone: [phone] Email: [email or web address]

## 2023-04-30 ENCOUNTER — Other Ambulatory Visit: Payer: Self-pay | Admitting: Internal Medicine

## 2023-04-30 ENCOUNTER — Encounter: Payer: Self-pay | Admitting: Internal Medicine

## 2023-05-02 NOTE — Telephone Encounter (Signed)
Noted../lmb 

## 2023-05-03 NOTE — Telephone Encounter (Signed)
Patient called to check on the status of his request. He would like for it to be sent to [facility] because he has 6 pills left. Next OV is 05/23/2023. Last OV was 11/23/2022. Best callback is [phone].

## 2023-05-04 DIAGNOSIS — C8339 Diffuse large B-cell lymphoma, extranodal and solid organ sites: Secondary | ICD-10-CM | POA: Diagnosis not present

## 2023-05-04 DIAGNOSIS — C8330 Diffuse large B-cell lymphoma, unspecified site: Secondary | ICD-10-CM | POA: Diagnosis not present

## 2023-05-04 DIAGNOSIS — R933 Abnormal findings on diagnostic imaging of other parts of digestive tract: Secondary | ICD-10-CM | POA: Diagnosis not present

## 2023-05-04 DIAGNOSIS — S22059D Unspecified fracture of T5-T6 vertebra, subsequent encounter for fracture with routine healing: Secondary | ICD-10-CM | POA: Diagnosis not present

## 2023-05-04 DIAGNOSIS — Z96641 Presence of right artificial hip joint: Secondary | ICD-10-CM | POA: Diagnosis not present

## 2023-05-04 DIAGNOSIS — Z79899 Other long term (current) drug therapy: Secondary | ICD-10-CM | POA: Diagnosis not present

## 2023-05-04 DIAGNOSIS — Z483 Aftercare following surgery for neoplasm: Secondary | ICD-10-CM | POA: Diagnosis not present

## 2023-05-04 DIAGNOSIS — S2243XD Multiple fractures of ribs, bilateral, subsequent encounter for fracture with routine healing: Secondary | ICD-10-CM | POA: Diagnosis not present

## 2023-05-08 ENCOUNTER — Other Ambulatory Visit: Payer: Self-pay | Admitting: Cardiovascular Disease

## 2023-05-08 ENCOUNTER — Other Ambulatory Visit: Payer: Self-pay | Admitting: Internal Medicine

## 2023-05-09 DIAGNOSIS — B351 Tinea unguium: Secondary | ICD-10-CM | POA: Diagnosis not present

## 2023-05-09 DIAGNOSIS — L97521 Non-pressure chronic ulcer of other part of left foot limited to breakdown of skin: Secondary | ICD-10-CM | POA: Diagnosis not present

## 2023-05-09 DIAGNOSIS — I872 Venous insufficiency (chronic) (peripheral): Secondary | ICD-10-CM | POA: Diagnosis not present

## 2023-05-09 DIAGNOSIS — L97511 Non-pressure chronic ulcer of other part of right foot limited to breakdown of skin: Secondary | ICD-10-CM | POA: Diagnosis not present

## 2023-05-09 NOTE — Telephone Encounter (Signed)
Rx request sent to pharmacy.  

## 2023-05-10 ENCOUNTER — Other Ambulatory Visit: Payer: Self-pay

## 2023-05-10 ENCOUNTER — Encounter (HOSPITAL_BASED_OUTPATIENT_CLINIC_OR_DEPARTMENT_OTHER): Payer: Self-pay

## 2023-05-10 ENCOUNTER — Emergency Department (HOSPITAL_BASED_OUTPATIENT_CLINIC_OR_DEPARTMENT_OTHER): Payer: Medicare PPO

## 2023-05-10 ENCOUNTER — Emergency Department (HOSPITAL_BASED_OUTPATIENT_CLINIC_OR_DEPARTMENT_OTHER)
Admission: EM | Admit: 2023-05-10 | Discharge: 2023-05-10 | Disposition: A | Discharge status: Home / Self Care | Payer: Medicare PPO | Attending: Emergency Medicine | Admitting: Emergency Medicine

## 2023-05-10 DIAGNOSIS — K439 Ventral hernia without obstruction or gangrene: Secondary | ICD-10-CM | POA: Diagnosis not present

## 2023-05-10 DIAGNOSIS — Z7901 Long term (current) use of anticoagulants: Secondary | ICD-10-CM | POA: Diagnosis not present

## 2023-05-10 DIAGNOSIS — R310 Gross hematuria: Secondary | ICD-10-CM

## 2023-05-10 DIAGNOSIS — Z79899 Other long term (current) drug therapy: Secondary | ICD-10-CM | POA: Insufficient documentation

## 2023-05-10 DIAGNOSIS — I2510 Atherosclerotic heart disease of native coronary artery without angina pectoris: Secondary | ICD-10-CM | POA: Insufficient documentation

## 2023-05-10 DIAGNOSIS — N1831 Chronic kidney disease, stage 3a: Secondary | ICD-10-CM | POA: Insufficient documentation

## 2023-05-10 DIAGNOSIS — J45909 Unspecified asthma, uncomplicated: Secondary | ICD-10-CM | POA: Insufficient documentation

## 2023-05-10 DIAGNOSIS — R7989 Other specified abnormal findings of blood chemistry: Secondary | ICD-10-CM | POA: Diagnosis not present

## 2023-05-10 DIAGNOSIS — R300 Dysuria: Secondary | ICD-10-CM | POA: Diagnosis present

## 2023-05-10 DIAGNOSIS — I509 Heart failure, unspecified: Secondary | ICD-10-CM | POA: Insufficient documentation

## 2023-05-10 DIAGNOSIS — N281 Cyst of kidney, acquired: Secondary | ICD-10-CM | POA: Diagnosis not present

## 2023-05-10 DIAGNOSIS — N3001 Acute cystitis with hematuria: Secondary | ICD-10-CM | POA: Diagnosis not present

## 2023-05-10 DIAGNOSIS — I130 Hypertensive heart and chronic kidney disease with heart failure and stage 1 through stage 4 chronic kidney disease, or unspecified chronic kidney disease: Secondary | ICD-10-CM | POA: Diagnosis not present

## 2023-05-10 DIAGNOSIS — K429 Umbilical hernia without obstruction or gangrene: Secondary | ICD-10-CM | POA: Diagnosis not present

## 2023-05-10 DIAGNOSIS — N132 Hydronephrosis with renal and ureteral calculous obstruction: Secondary | ICD-10-CM | POA: Diagnosis not present

## 2023-05-10 LAB — URINALYSIS, ROUTINE W REFLEX MICROSCOPIC

## 2023-05-10 LAB — COMPREHENSIVE METABOLIC PANEL
ALT: 17 U/L (ref 0–44)
AST: 22 U/L (ref 15–41)
Albumin: 3.7 g/dL (ref 3.5–5.0)
Alkaline Phosphatase: 63 U/L (ref 38–126)
Anion gap: 8 (ref 5–15)
BUN: 23 mg/dL (ref 8–23)
CO2: 27 mmol/L (ref 22–32)
Calcium: 9.3 mg/dL (ref 8.9–10.3)
Chloride: 101 mmol/L (ref 98–111)
Creatinine, Ser: 1.36 mg/dL — ABNORMAL HIGH (ref 0.61–1.24)
GFR, Estimated: 54 mL/min — ABNORMAL LOW (ref >60)
Glucose, Bld: 93 mg/dL (ref 70–99)
Potassium: 4.2 mmol/L (ref 3.5–5.1)
Sodium: 136 mmol/L (ref 135–145)
Total Bilirubin: 1.1 mg/dL (ref 0.3–1.2)
Total Protein: 6 g/dL — ABNORMAL LOW (ref 6.5–8.1)

## 2023-05-10 LAB — CBC WITH DIFFERENTIAL/PLATELET
Abs Immature Granulocytes: 0.07 10*3/uL (ref 0.00–0.07)
Basophils Absolute: 0 10*3/uL (ref 0.0–0.1)
Basophils Relative: 0 %
Eosinophils Absolute: 0.1 10*3/uL (ref 0.0–0.5)
Eosinophils Relative: 1 %
HCT: 43.7 % (ref 39.0–52.0)
Hemoglobin: 14.2 g/dL (ref 13.0–17.0)
Immature Granulocytes: 1 %
Lymphocytes Relative: 12 %
Lymphs Abs: 0.8 10*3/uL (ref 0.7–4.0)
MCH: 30.7 pg (ref 26.0–34.0)
MCHC: 32.5 g/dL (ref 30.0–36.0)
MCV: 94.4 fL (ref 80.0–100.0)
Monocytes Absolute: 0.7 10*3/uL (ref 0.1–1.0)
Monocytes Relative: 9 %
Neutro Abs: 5.6 10*3/uL (ref 1.7–7.7)
Neutrophils Relative %: 77 %
Platelets: 146 10*3/uL — ABNORMAL LOW (ref 150–400)
RBC: 4.63 MIL/uL (ref 4.22–5.81)
RDW: 19.2 % — ABNORMAL HIGH (ref 11.5–15.5)
WBC: 7.3 10*3/uL (ref 4.0–10.5)
nRBC: 0 % (ref 0.0–0.2)

## 2023-05-10 LAB — URINALYSIS, MICROSCOPIC (REFLEX): RBC / HPF: >50 RBC/hpf (ref 0–5)

## 2023-05-10 MED ORDER — CIPROFLOXACIN HCL 500 MG PO TABS: 500.0000 mg | ORAL_TABLET | Freq: Two times a day (BID) | ORAL | 0 refills | Status: AC

## 2023-05-10 MED ORDER — SODIUM CHLORIDE 0.9 % IV SOLN
1.0000 g | Freq: Once | INTRAVENOUS | Status: AC
  Administered 2023-05-10: 1 g via INTRAVENOUS
  Filled 2023-05-10: qty 10

## 2023-05-10 NOTE — ED Notes (Signed)
Lab called to check on UA not in process.

## 2023-05-10 NOTE — ED Notes (Signed)
Pt verbalized understanding of d/c instructions, meds, and followup care. Denies questions. VSS, no distress noted. Steady gait to exit with all belongings.  ?

## 2023-05-10 NOTE — ED Notes (Signed)
Pt encouraged to provide urine per order.

## 2023-05-10 NOTE — ED Notes (Signed)
Spoke with lab to add on urine culture 

## 2023-05-10 NOTE — ED Provider Notes (Signed)
[place] EMERGENCY DEPARTMENT AT [facility] Provider Note   CSN: [id number] Arrival date & time: 05/10/23  1129     History  Chief Complaint  Patient presents with   Dysuria    [name] is a 77 y.o. male.  77 year old male presents with concern for urinary tract infection. Patient reports urinary urgency and intermittent dysuria onset yesterday. History of UTI x 2 in the past 2 years. Patient did a home test that was positive so he came for confirmation and treatment. Denies fevers, chills, abdominal or back pain. States he will decline admission if offered as his husband is having back surgery this week and he needs to be home to take care of him.       Home Medications Prior to Admission medications   Medication Sig Start Date End Date Taking? Authorizing Provider  ciprofloxacin (CIPRO) 500 MG tablet Take 1 tablet (500 mg total) by mouth every 12 (twelve) hours for 7 days. 05/10/23 05/17/23 Yes Jeannie Fend, PA-C  acetaminophen (TYLENOL) 325 MG tablet Take 2 tablets (650 mg total) by mouth every 6 (six) hours as needed for mild pain (or Fever >/= 101). 03/25/22   Marguerita Merles Latif, DO  acyclovir (ZOVIRAX) 400 MG tablet Take 400 mg by mouth 2 (two) times daily. 08/11/22   [provider]  albuterol (VENTOLIN HFA) 108 (90 Base) MCG/ACT inhaler Inhale 2 puffs into the lungs every 6 (six) hours as needed for wheezing or shortness of breath.    [provider]  alfuzosin (UROXATRAL) 10 MG 24 hr tablet TAKE 1 TABLET AT BEDTIME 08/10/22   McKenzie, Mardene Celeste, MD  allopurinol (ZYLOPRIM) 100 MG tablet Take 100 mg by mouth daily.    [provider]  amoxicillin (AMOXIL) 500 MG capsule TAKE 4 CAPSULES BY MOUTH ONE HOUR PRIOR TO DENTAL APPOINTMENT/PROCEDURE. 10/26/22   [provider]  atorvastatin (LIPITOR) 80 MG tablet TAKE 1 TABLET EVERY DAY Patient taking differently: Take 80 mg by mouth at bedtime. 08/09/22   Alver Sorrow, NP   buPROPion (WELLBUTRIN XL) 300 MG 24 hr tablet TAKE 1 TABLET EVERY DAY WITH BREAKFAST 05/09/23   Plotnikov, Georgina Quint, MD  Calcium Citrate-Vitamin D (CALCIUM CITRATE + D PO) Take 1 tablet by mouth 2 (two) times daily. 1200mg  of Calcium and 1000 units vitamin D3    [provider]  cefadroxil (DURICEF) 500 MG capsule Take 1 capsule (500 mg total) by mouth 2 (two) times daily. 04/19/23   Peter Garter, PA  Cholecalciferol (VITAMIN D) 50 MCG (2000 UT) tablet Take 4,000 Units by mouth in the morning and at bedtime.    [provider]  clonazePAM (KLONOPIN) 1 MG tablet TAKE 1 TABLET THREE TIMES DAILY AS NEEDED FOR ANXIETY 05/04/23   Plotnikov, Georgina Quint, MD  CRANBERRY PO Take 1 tablet by mouth in the morning and at bedtime.    [provider]  denosumab (PROLIA) 60 MG/ML SOSY injection Inject 60 mg as directed every 6 (six) months. 12/10/15   [provider]  denosumab (PROLIA) 60 MG/ML SOSY injection Inject 60 mg into the skin every 6 (six) months.    [provider]  diltiazem (TIAZAC) 240 MG 24 hr capsule 240 mg.    [provider]  DULoxetine (CYMBALTA) 60 MG capsule TAKE 1 CAPSULE TWICE DAILY 05/09/23   Plotnikov, Georgina Quint, MD  EPINEPHrine 0.3 mg/0.3 mL IJ SOAJ injection Inject 0.3 mg into the muscle once as needed  for anaphylaxis (severe allergic reaction). 07/31/21   [provider]  ezetimibe (ZETIA) 10 MG tablet TAKE 1 TABLET EVERY DAY 05/09/23   Chilton Si, MD  feeding supplement (ENSURE ENLIVE / ENSURE PLUS) LIQD Take 237 mLs by mouth 2 (two) times daily between meals. Patient taking differently: Take 237 mLs by mouth daily. 03/25/22   Marguerita Merles Latif, DO  ferrous sulfate 325 (65 FE) MG tablet Take 325 mg by mouth 2 (two) times daily with a meal.    [provider]  lactobacillus acidophilus (BACID) TABS tablet Take 1 tablet by mouth daily.    [provider]  levothyroxine (SYNTHROID) 125 MCG tablet Take 1  tablet (125 mcg total) by mouth daily at 6 (six) AM. 08/23/22   Plotnikov, Georgina Quint, MD  lidocaine-prilocaine (EMLA) cream Apply 1 Application topically as needed. Patient taking differently: Apply 1 Application topically as needed (For port access/chemotherapy). 04/21/22   Johney Maine, MD  loratadine (CLARITIN) 10 MG tablet Take 10 mg by mouth daily.    [provider]  Magnesium 250 MG TABS Take 250 mg by mouth daily.    [provider]  metoprolol tartrate (LOPRESSOR) 25 MG tablet Take 25 mg by mouth 2 (two) times daily.    [provider]  Multiple Vitamin (MULTIVITAMIN WITH MINERALS) TABS tablet Take 1 tablet by mouth every morning. Men's One-A-Day 50+    [provider]  MYRBETRIQ 25 MG TB24 tablet TAKE 1 TABLET EVERY DAY Patient taking differently: Take 25 mg by mouth at bedtime. 10/06/21   McKenzie, Mardene Celeste, MD  nitroGLYCERIN (NITROSTAT) 0.4 MG SL tablet Place 0.4 mg under the tongue every 5 (five) minutes as needed for chest pain.    [provider]  omeprazole (PRILOSEC) 40 MG capsule TAKE 1 CAPSULE EVERY DAY BEFORE BREAKFAST 05/09/23   Plotnikov, Georgina Quint, MD  Potassium Citrate 15 MEQ (1620 MG) TBCR Take 1 tablet by mouth See admin instructions. Take one tablet (15 meq) by mouth twice daily - with lunch and supper Patient taking differently: Take 1 tablet by mouth in the morning and at bedtime. 06/10/22   McKenzie, Mardene Celeste, MD  Probiotic Product (PROBIOTIC PO) Take 1 capsule by mouth daily with breakfast.    [provider]  Risankizumab-rzaa [facility]) Inject into the skin. On hold Patient not taking: Reported on 02/01/2023    [provider]  solifenacin (VESICARE) 10 MG tablet Take 10 mg by mouth daily.    [provider]  Specialty Vitamins Products (MG PLUS PROTEIN) 133 MG TABS Take 1 tablet by mouth 3 (three) times daily. 11/22/22   [provider]  tamsulosin (FLOMAX) 0.4 MG CAPS capsule  Take 1 capsule by mouth daily.    [provider]  testosterone enanthate (DELATESTRYL) 200 MG/ML injection INJECT (200MG ) INTO THE MUSCLE EVERY 14 DAYS. DISCARD VIAL AFTER 28 DAYS. Patient taking differently: Inject 200 mg into the muscle every 14 (fourteen) days. Every other Friday 09/22/21   Plotnikov, Georgina Quint, MD  vitamin C (ASCORBIC ACID) 500 MG tablet Take 500 mg by mouth daily with lunch.    [provider]      Allergies    Short ragweed pollen ext    Review of Systems   Review of Systems Negative except as per HPI Physical Exam Updated Vital Signs BP 124/82   Pulse 95   Temp 98 F (36.7 C) (Oral)   Resp 16   Ht 6\' 4"  (1.93  m)   Wt 103.9 kg   SpO2 96%   BMI 27.87 kg/m  Physical Exam Vitals and nursing note reviewed.  Constitutional:      General: He is not in acute distress.    Appearance: He is well-developed. He is not diaphoretic.  HENT:     Head: Normocephalic and atraumatic.  Cardiovascular:     Rate and Rhythm: Normal rate and regular rhythm.     Pulses: Normal pulses.     Heart sounds: Normal heart sounds.  Pulmonary:     Effort: Pulmonary effort is normal.     Breath sounds: Normal breath sounds.  Abdominal:     Palpations: Abdomen is soft.     Tenderness: There is no abdominal tenderness. There is no right CVA tenderness or left CVA tenderness.    Skin:    General: Skin is warm and dry.     Findings: No erythema or rash.  Neurological:     Mental Status: He is alert and oriented to person, place, and time.  Psychiatric:        Behavior: Behavior normal.     ED Results / Procedures / Treatments   Labs (all labs ordered are listed, but only abnormal results are displayed) Labs Reviewed  URINALYSIS, ROUTINE W REFLEX MICROSCOPIC - Abnormal; Notable for the following components:      Result Value   Color, Urine RED (*)    APPearance HAZY (*)    Glucose, UA   (*)    Value: TEST NOT REPORTED DUE TO COLOR INTERFERENCE OF  URINE PIGMENT   Hgb urine dipstick   (*)    Value: TEST NOT REPORTED DUE TO COLOR INTERFERENCE OF URINE PIGMENT   Bilirubin Urine   (*)    Value: TEST NOT REPORTED DUE TO COLOR INTERFERENCE OF URINE PIGMENT   Ketones, ur   (*)    Value: TEST NOT REPORTED DUE TO COLOR INTERFERENCE OF URINE PIGMENT   Protein, ur   (*)    Value: TEST NOT REPORTED DUE TO COLOR INTERFERENCE OF URINE PIGMENT   Nitrite   (*)    Value: TEST NOT REPORTED DUE TO COLOR INTERFERENCE OF URINE PIGMENT   Leukocytes,Ua   (*)    Value: TEST NOT REPORTED DUE TO COLOR INTERFERENCE OF URINE PIGMENT   All other components within normal limits  URINALYSIS, MICROSCOPIC (REFLEX) - Abnormal; Notable for the following components:   Bacteria, UA RARE (*)    All other components within normal limits  COMPREHENSIVE METABOLIC PANEL - Abnormal; Notable for the following components:   Creatinine, Ser 1.36 (*)    Total Protein 6.0 (*)    GFR, Estimated 54 (*)    All other components within normal limits  CBC WITH DIFFERENTIAL/PLATELET - Abnormal; Notable for the following components:   RDW 19.2 (*)    Platelets 146 (*)    All other components within normal limits  URINE CULTURE    EKG None  Radiology CT Renal Stone Study  Result Date: 05/10/2023 CLINICAL DATA:  Abdominal pain, flank pain EXAM: CT ABDOMEN AND PELVIS WITHOUT CONTRAST TECHNIQUE: Multidetector CT imaging of the abdomen and pelvis was performed following the standard protocol without IV contrast. RADIATION DOSE REDUCTION: This exam was performed according to the departmental dose-optimization program which includes automated exposure control, adjustment of the mA and/or kV according to patient size and/or use of iterative reconstruction technique. COMPARISON:  09/05/2022 FINDINGS: Lower chest: Small linear densities are seen in the lower lung  fields suggesting scarring or subsegmental atelectasis. There is no pleural effusion. Hepatobiliary: No focal abnormalities are  seen in the liver. There is no dilation of bile ducts. Gallbladder is unremarkable. Pancreas: No focal abnormalities are seen. Spleen: Unremarkable. Adrenals/Urinary Tract: There is 2.1 cm low-density nodule in right adrenal. There is 1.7 cm low-density nodule in left adrenal. These findings have not changed significantly suggesting possible adenomas. There is no hydronephrosis in the left kidney. There is 3 mm calculus in the midportion of left kidney. There is a 2.2 cm cyst in the posterior midportion of left kidney. There is mild right hydronephrosis. There is interval appearance of multiple right renal stones measuring up to 7 mm in diameter. There is increase attenuation in the lumen of right renal pelvis. In image 56 of series 2, there is here 3 mm calcific density in the posterior margin of right ureter. In heavy 71 of series 2, there is a 3 mm calcific density adjacent to the distal course of right ureter. Beam hardening artifacts caused by surgical hardware in right hip limited evaluation of portions of pelvis and urinary bladder. Stomach/Bowel: Stomach is not distended. Mild diffuse wall thickening in the stomach may be due to incomplete distention. Small bowel loops are not dilated. Appendix is not seen. There is no pericecal inflammation. There is no significant wall thickening in colon. There is no pericolic stranding. Vascular/Lymphatic: Arterial calcifications are seen in aorta and its major branches. Coronary artery calcifications are seen. Reproductive: Evaluation is limited due to beam hardening artifacts. Other: There is no ascites or pneumoperitoneum. Umbilical hernia containing fat is seen. Bilateral inguinal hernias containing fat are seen, larger on the left side. Musculoskeletal: There are old fractures in multiple lower ribs and the transverse process of T10 vertebra with no significant change. There is marked compression fracture in body of L1 vertebra with retropulsion causing spinal  stenosis. This finding has not changed. Fractures with sclerotic margins are seen in the right transverse processes of L3 and L4 vertebrae may be residual from previous injury. This finding was not evident on 09/05/2022. There is previous right hip arthroplasty. IMPRESSION: There is no evidence of intestinal obstruction or pneumoperitoneum. Mild right hydronephrosis. There are small calcifications in the distal courses of the right ureter which may suggest vascular calcifications lying immediately adjacent to the ureter or small right ureteral stones. Multiple bilateral renal stones, more so in the right kidney with significant interval increase in number. There is increased density in the lumen of right renal pelvis without discrete margins suggesting possible confluence of numerous tiny renal stones or blood in the lumen of right renal pelvis or soft tissue lesion in the right renal pelvis. Short-term follow-up multiphasic CT may be considered. Fractures are seen in right transverse processes of L3 and L4 vertebrae with sclerotic margins. This finding suggests possible ununited chronic fractures. However, these fracture lines were not evident in the previous study done on 09/05/2022. Severe compression fracture of L1 vertebra with retropulsion has not changed significantly. There are old fractures in multiple lower ribs and spinous process of T10 vertebra. Low-density nodules in both adrenals suggest possible adenomas. Left renal cyst. Aortic arteriosclerosis. Coronary artery calcifications are seen. Umbilical and bilateral inguinal hernias containing fat. Other findings as described in the body of the report. Electronically Signed   By: Ernie Avena M.D.   On: 05/10/2023 14:58    Procedures Procedures    Medications Ordered in ED Medications  cefTRIAXone (ROCEPHIN) 1 g in sodium chloride  0.9 % 100 mL IVPB (1 g Intravenous New Bag/Given 05/10/23 1512)    ED Course/ Medical Decision Making/ A&P                                  Medical Decision Making Amount and/or Complexity of Data Reviewed Labs: ordered. Radiology: ordered.  Risk Prescription drug management.   This patient presents to the ED for concern of dysuria, urinary urgency, this involves an extensive number of treatment options, and is a complaint that carries with it a high risk of complications and morbidity.  The differential diagnosis includes  but not limited to urinary tract infection, urinary retention, pyelonephritis, kidney stone   Co morbidities that complicate the patient evaluation  A fib, on Xarelto, large B cell lymphoma, asthma, stage 3a CKD, HTN, kidney stones, CAD, CHF, additional history reviewed in chart   Additional history obtained:  External records from outside source obtained and reviewed including visit to Dr. Mena Goes with Urology dated 04/15/23. Prior ER work up with concern for bladder thickening concerning for neoplasm, evaluated by urology.    Lab Tests:  I Ordered, and personally interpreted labs.  The pertinent results include: CBC with normal WC, no significant findings.  CMP with mildly elevated creatinine, improved compared to prior, known CKD.  Urinalysis unable to process secondary to gross hematuria, micro with rare bacteria, 0-5 white cells.   Imaging Studies ordered:  I ordered imaging studies including CT stone study noncontrast I independently visualized and interpreted imaging which showed no evidence of obstruction, chronic changes as reviewed and report I agree with the radiologist interpretation   Consultations Obtained:  I requested consultation with the Dr. Renaye Rakers,  and discussed lab and imaging findings as well as pertinent plan - they recommend: agrees with plan of care   Problem List / ED Course / Critical interventions / Medication management  77 year old male presents with concern for UTI with urinary urgency and dysuria, with gross hematuria on review  of urine at bedside. No fevers, no abdominal/flank pain. UA unable to report due to gross hematuria, micro with rare bacteria, will treat with rocephin/cipro and send culture. Discussed black bow warning on cipro with patient, if culture is negative, advised to discontinue the cipro. CT negative for obstructing stone. Results printed and reviewed at bedside with patient. Recommend follow up with PCP and urology to review his results from today's visit.  I ordered medication including rocephin  for UTI  Reevaluation of the patient after these medicines showed that the patient stayed the same I have reviewed the patients home medicines and have made adjustments as needed   Social Determinants of Health:  Has PCP and urology for follow up   Test / Admission - Considered:  Stable for dc with close follow up and return to ER precautions          Final Clinical Impression(s) / ED Diagnoses Final diagnoses:  Gross hematuria  Acute cystitis with hematuria  Ventral hernia without obstruction or gangrene    Rx / DC Orders ED Discharge Orders          Ordered    ciprofloxacin (CIPRO) 500 MG tablet  Every 12 hours        05/10/23 1540              Jeannie Fend, PA-C 05/10/23 1547    Terald Sleeper, MD 05/11/23 0800

## 2023-05-10 NOTE — Discharge Instructions (Signed)
Follow-up in your MyChart app for your urine culture results as discussed. Recheck with your primary care provider and your urologist, please call and schedule appointment. Take antibiotics as prescribed. Return to the ER for fever, worsening symptoms or other concerns.

## 2023-05-10 NOTE — ED Triage Notes (Signed)
Pt to ED c/o possible UTI, Pt noted he took a home UTI test which was positive, reports intermittent burning with urination. HX of the same.

## 2023-05-12 DIAGNOSIS — J301 Allergic rhinitis due to pollen: Secondary | ICD-10-CM | POA: Diagnosis not present

## 2023-05-12 DIAGNOSIS — J4520 Mild intermittent asthma, uncomplicated: Secondary | ICD-10-CM | POA: Diagnosis not present

## 2023-05-12 DIAGNOSIS — L2089 Other atopic dermatitis: Secondary | ICD-10-CM | POA: Diagnosis not present

## 2023-05-12 DIAGNOSIS — J300 Vasomotor rhinitis: Secondary | ICD-10-CM | POA: Diagnosis not present

## 2023-05-14 ENCOUNTER — Telehealth (HOSPITAL_BASED_OUTPATIENT_CLINIC_OR_DEPARTMENT_OTHER): Payer: Self-pay | Admitting: *Deleted

## 2023-05-14 NOTE — Progress Notes (Signed)
ED Antimicrobial Stewardship Positive Culture Follow Up   [name] is an 77 y.o. male who presented to [facility] on 05/10/2023 with a chief complaint of  Chief Complaint  Patient presents with   Dysuria    Recent Results (from the past 720 hour(s))  Urine Culture     Status: Abnormal   Collection Time: 04/19/23 11:58 AM   Specimen: Urine, Random  Result Value Ref Range Status   Specimen Description   Final    URINE, RANDOM Performed at [facility], [address], [place], [place] [address]    Special Requests   Final    NONE Reflexed from [id number] Performed at [facility], [address] [place], [place] [address]    Culture >=100,000 COLONIES/mL ESCHERICHIA COLI (A)  Final   Report Status 04/21/2023 FINAL  Final   Organism ID, Bacteria ESCHERICHIA COLI (A)  Final      Susceptibility   Escherichia coli - MIC*    AMPICILLIN >=32 RESISTANT Resistant     CEFAZOLIN <=4 SENSITIVE Sensitive     CEFEPIME <=0.12 SENSITIVE Sensitive     CEFTRIAXONE <=0.25 SENSITIVE Sensitive     CIPROFLOXACIN 0.5 INTERMEDIATE Intermediate     GENTAMICIN <=1 SENSITIVE Sensitive     IMIPENEM <=0.25 SENSITIVE Sensitive     NITROFURANTOIN <=16 SENSITIVE Sensitive     TRIMETH/SULFA >=320 RESISTANT Resistant     AMPICILLIN/SULBACTAM >=32 RESISTANT Resistant     PIP/TAZO <=4 SENSITIVE Sensitive     * >=100,000 COLONIES/mL ESCHERICHIA COLI  Urine Culture     Status: Abnormal   Collection Time: 05/10/23 12:37 PM   Specimen: Urine, Clean Catch  Result Value Ref Range Status   Specimen Description   Final    URINE, CLEAN CATCH Performed at [facility], [address] [place], [place] [address]    Special Requests   Final    NONE Performed at [facility], [address], [place], [place] [address]    Culture 80,000 COLONIES/mL ENTEROCOCCUS FAECALIS (A)  Final   Report Status 05/13/2023 FINAL  Final   Organism  ID, Bacteria ENTEROCOCCUS FAECALIS (A)  Final      Susceptibility   Enterococcus faecalis - MIC*    AMPICILLIN <=2 SENSITIVE Sensitive     NITROFURANTOIN <=16 SENSITIVE Sensitive     VANCOMYCIN 1 SENSITIVE Sensitive     * 80,000 COLONIES/mL ENTEROCOCCUS FAECALIS    [x]  Treated with ciprofloxacin, organism resistant to prescribed antimicrobial  STOP ciprofloxacin New antibiotic prescription: Augmentin 875/125 mg tablets: Take 1 tablet twice daily for 10 days (Qty 20; Refills 0)  ED Provider: Lorna Dibble, PharmD, BCPS 05/14/2023 10:55 AM ED Clinical Pharmacist -  [phone]

## 2023-05-14 NOTE — Telephone Encounter (Signed)
Post ED Visit - Positive Culture Follow-up: Successful Patient Follow-Up  Culture assessed and recommendations reviewed by:  []  Enzo Bi, Pharm.D. []  Celedonio Miyamoto, Pharm.D., BCPS AQ-ID []  Garvin Fila, Pharm.D., BCPS []  Georgina Pillion, Pharm.D., BCPS []  [place], [place].D., BCPS, AAHIVP []  Estella Husk, Pharm.D., BCPS, AAHIVP []  Lysle Pearl, PharmD, BCPS []  Phillips Climes, PharmD, BCPS []  Agapito Games, PharmD, BCPS [x]   Delmar Landau,  PharmD  Positive urine culture  []  Patient discharged without antimicrobial prescription and treatment is now indicated [x]  Organism is resistant to prescribed ED discharge antimicrobial []  Patient with positive blood cultures  Changes discussed with ED provider: Elayne Snare New antibiotic prescription Stop Cipro Augmentin 875/125mg  tablet. Take 1 tablet BID x 10 days Called to [facility], Roque Lias, [facility]  Contacted patient, date 05/14/23, time 1346   Patsey Berthold 05/14/2023, 1:48 PM

## 2023-05-16 DIAGNOSIS — J3081 Allergic rhinitis due to animal (cat) (dog) hair and dander: Secondary | ICD-10-CM | POA: Diagnosis not present

## 2023-05-16 DIAGNOSIS — J3089 Other allergic rhinitis: Secondary | ICD-10-CM | POA: Diagnosis not present

## 2023-05-16 DIAGNOSIS — J301 Allergic rhinitis due to pollen: Secondary | ICD-10-CM | POA: Diagnosis not present

## 2023-05-23 ENCOUNTER — Encounter: Payer: Self-pay | Admitting: Internal Medicine

## 2023-05-23 ENCOUNTER — Ambulatory Visit: Payer: Medicare PPO | Admitting: Internal Medicine

## 2023-05-23 VITALS — BP 122/60 | HR 100 | Temp 97.9°F | Ht 76.0 in | Wt 246.0 lb

## 2023-05-23 DIAGNOSIS — I2510 Atherosclerotic heart disease of native coronary artery without angina pectoris: Secondary | ICD-10-CM | POA: Diagnosis not present

## 2023-05-23 DIAGNOSIS — R7989 Other specified abnormal findings of blood chemistry: Secondary | ICD-10-CM

## 2023-05-23 DIAGNOSIS — E039 Hypothyroidism, unspecified: Secondary | ICD-10-CM

## 2023-05-23 DIAGNOSIS — M545 Low back pain, unspecified: Secondary | ICD-10-CM

## 2023-05-23 DIAGNOSIS — C8338 Diffuse large B-cell lymphoma, lymph nodes of multiple sites: Secondary | ICD-10-CM

## 2023-05-23 DIAGNOSIS — I10 Essential (primary) hypertension: Secondary | ICD-10-CM | POA: Diagnosis not present

## 2023-05-23 DIAGNOSIS — M79605 Pain in left leg: Secondary | ICD-10-CM

## 2023-05-23 DIAGNOSIS — I4811 Longstanding persistent atrial fibrillation: Secondary | ICD-10-CM

## 2023-05-23 NOTE — Assessment & Plan Note (Signed)
On chemo q 2 wks now at [facility]

## 2023-05-23 NOTE — Assessment & Plan Note (Signed)
On levothyroxine 125 mcg a day Labs at [facility]

## 2023-05-23 NOTE — Assessment & Plan Note (Signed)
Labs at [facility]

## 2023-05-23 NOTE — Assessment & Plan Note (Signed)
Cont w/Xarelto °

## 2023-05-23 NOTE — Progress Notes (Signed)
Subjective:  Patient ID: [name], male    DOB: 05/31/1946  Age: 77 y.o. MRN: [id number]  CC: Follow-up (6 mnth f/u)   HPI JERRILL DIFRANCO presents for LBP, anxiety, dyslipidemia Pt lost 100 lbs   Outpatient Medications Prior to Visit  Medication Sig Dispense Refill   acetaminophen (TYLENOL) 325 MG tablet Take 2 tablets (650 mg total) by mouth every 6 (six) hours as needed for mild pain (or Fever >/= 101). 20 tablet    acyclovir (ZOVIRAX) 400 MG tablet Take 400 mg by mouth 2 (two) times daily.     albuterol (VENTOLIN HFA) 108 (90 Base) MCG/ACT inhaler Inhale 2 puffs into the lungs every 6 (six) hours as needed for wheezing or shortness of breath.     alfuzosin (UROXATRAL) 10 MG 24 hr tablet TAKE 1 TABLET AT BEDTIME 90 tablet 10   allopurinol (ZYLOPRIM) 100 MG tablet Take 100 mg by mouth daily.     amoxicillin (AMOXIL) 500 MG capsule TAKE 4 CAPSULES BY MOUTH ONE HOUR PRIOR TO DENTAL APPOINTMENT/PROCEDURE.     atorvastatin (LIPITOR) 80 MG tablet TAKE 1 TABLET EVERY DAY (Patient taking differently: Take 80 mg by mouth at bedtime.) 90 tablet 1   buPROPion (WELLBUTRIN XL) 300 MG 24 hr tablet TAKE 1 TABLET EVERY DAY WITH BREAKFAST 90 tablet 3   Calcium Citrate-Vitamin D (CALCIUM CITRATE + D PO) Take 1 tablet by mouth 2 (two) times daily. 1200mg  of Calcium and 1000 units vitamin D3     cefadroxil (DURICEF) 500 MG capsule Take 1 capsule (500 mg total) by mouth 2 (two) times daily. 28 capsule 0   Cholecalciferol (VITAMIN D) 50 MCG (2000 UT) tablet Take 4,000 Units by mouth in the morning and at bedtime.     clonazePAM (KLONOPIN) 1 MG tablet TAKE 1 TABLET THREE TIMES DAILY AS NEEDED FOR ANXIETY 90 tablet 3   CRANBERRY PO Take 1 tablet by mouth in the morning and at bedtime.     denosumab (PROLIA) 60 MG/ML SOSY injection Inject 60 mg as directed every 6 (six) months.     DULoxetine (CYMBALTA) 60 MG capsule TAKE 1 CAPSULE TWICE DAILY 180 capsule 3   EPINEPHrine 0.3 mg/0.3 mL IJ SOAJ  injection Inject 0.3 mg into the muscle once as needed for anaphylaxis (severe allergic reaction).     ezetimibe (ZETIA) 10 MG tablet TAKE 1 TABLET EVERY DAY 90 tablet 1   feeding supplement (ENSURE ENLIVE / ENSURE PLUS) LIQD Take 237 mLs by mouth 2 (two) times daily between meals. (Patient taking differently: Take 237 mLs by mouth daily.) 237 mL 12   ferrous sulfate 325 (65 FE) MG tablet Take 325 mg by mouth 2 (two) times daily with a meal.     HYDROcodone-acetaminophen (NORCO/VICODIN) 5-325 MG tablet Take 1 tablet by mouth every 4 (four) hours as needed.     lactobacillus acidophilus (BACID) TABS tablet Take 1 tablet by mouth daily.     lidocaine-prilocaine (EMLA) cream Apply 1 Application topically as needed. (Patient taking differently: Apply 1 Application topically as needed (For port access/chemotherapy).) 30 g 0   loratadine (CLARITIN) 10 MG tablet Take 10 mg by mouth daily.     Magnesium 250 MG TABS Take 250 mg by mouth daily.     metoprolol tartrate (LOPRESSOR) 25 MG tablet Take 25 mg by mouth 2 (two) times daily.     Multiple Vitamin (MULTIVITAMIN WITH MINERALS) TABS tablet Take 1 tablet by mouth every morning. Men's One-A-Day 50+  MYRBETRIQ 25 MG TB24 tablet TAKE 1 TABLET EVERY DAY (Patient taking differently: Take 25 mg by mouth at bedtime.) 90 tablet 3   nitroGLYCERIN (NITROSTAT) 0.4 MG SL tablet Place 0.4 mg under the tongue every 5 (five) minutes as needed for chest pain.     omeprazole (PRILOSEC) 40 MG capsule TAKE 1 CAPSULE EVERY DAY BEFORE BREAKFAST 90 capsule 3   Potassium Citrate 15 MEQ (1620 MG) TBCR Take 1 tablet by mouth See admin instructions. Take one tablet (15 meq) by mouth twice daily - with lunch and supper (Patient taking differently: Take 1 tablet by mouth in the morning and at bedtime.) 180 tablet 3   Probiotic Product (PROBIOTIC PO) Take 1 capsule by mouth daily with breakfast.     solifenacin (VESICARE) 10 MG tablet Take 10 mg by mouth daily.     Specialty  Vitamins Products (MG PLUS PROTEIN) 133 MG TABS Take 1 tablet by mouth 3 (three) times daily.     tamsulosin (FLOMAX) 0.4 MG CAPS capsule Take 1 capsule by mouth daily.     testosterone enanthate (DELATESTRYL) 200 MG/ML injection INJECT (200MG ) INTO THE MUSCLE EVERY 14 DAYS. DISCARD VIAL AFTER 28 DAYS. (Patient taking differently: Inject 200 mg into the muscle every 14 (fourteen) days. Every other Friday) 5 mL 3   vitamin C (ASCORBIC ACID) 500 MG tablet Take 500 mg by mouth daily with lunch.     Risankizumab-rzaa (SKYRIZI Ingenio) Inject into the skin. On hold (Patient not taking: Reported on 02/01/2023)     denosumab (PROLIA) 60 MG/ML SOSY injection Inject 60 mg into the skin every 6 (six) months.     diltiazem (TIAZAC) 240 MG 24 hr capsule 240 mg.     levothyroxine (SYNTHROID) 125 MCG tablet Take 1 tablet (125 mcg total) by mouth daily at 6 (six) AM. 90 tablet 3   No facility-administered medications prior to visit.    ROS: Review of Systems  Constitutional:  Negative for appetite change, fatigue and unexpected weight change.  HENT:  Negative for congestion, nosebleeds, sneezing, sore throat and trouble swallowing.   Eyes:  Negative for itching and visual disturbance.  Respiratory:  Negative for cough.   Cardiovascular:  Negative for chest pain, palpitations and leg swelling.  Gastrointestinal:  Negative for abdominal distention, blood in stool, diarrhea and nausea.  Genitourinary:  Negative for frequency and hematuria.  Musculoskeletal:  Positive for back pain and gait problem. Negative for joint swelling and neck pain.  Skin:  Negative for rash.  Neurological:  Negative for dizziness, tremors, speech difficulty and weakness.  Psychiatric/Behavioral:  Negative for agitation, dysphoric mood and sleep disturbance. The patient is not nervous/anxious.     Objective:  BP 122/60 (BP Location: Right Arm, Patient Position: Sitting, Cuff Size: Large)   Pulse 100   Temp 97.9 F (36.6 C) (Oral)    Ht 6\' 4"  (1.93 m)   Wt 246 lb (111.6 kg)   SpO2 98%   BMI 29.94 kg/m   BP Readings from Last 3 Encounters:  05/23/23 122/60  05/10/23 (!) 149/107  04/19/23 124/78    Wt Readings from Last 3 Encounters:  05/23/23 246 lb (111.6 kg)  05/10/23 229 lb (103.9 kg)  04/19/23 240 lb (108.9 kg)    Physical Exam Constitutional:      General: He is not in acute distress.    Appearance: He is well-developed. He is obese.     Comments: NAD  Eyes:     Conjunctiva/sclera: Conjunctivae normal.  Pupils: Pupils are equal, round, and reactive to light.  Neck:     Thyroid: No thyromegaly.     Vascular: No JVD.  Cardiovascular:     Rate and Rhythm: Normal rate and regular rhythm.     Heart sounds: Normal heart sounds. No murmur heard.    No friction rub. No gallop.  Pulmonary:     Effort: Pulmonary effort is normal. No respiratory distress.     Breath sounds: Normal breath sounds. No wheezing or rales.  Chest:     Chest wall: No tenderness.  Abdominal:     General: Bowel sounds are normal. There is no distension.     Palpations: Abdomen is soft. There is no mass.     Tenderness: There is no abdominal tenderness. There is no guarding or rebound.  Musculoskeletal:        General: Tenderness present. Normal range of motion.     Cervical back: Normal range of motion.  Lymphadenopathy:     Cervical: No cervical adenopathy.  Skin:    General: Skin is warm and dry.     Findings: No rash.  Neurological:     Mental Status: He is alert and oriented to person, place, and time.     Cranial Nerves: No cranial nerve deficit.     Motor: No abnormal muscle tone.     Coordination: Coordination normal.     Gait: Gait abnormal.     Deep Tendon Reflexes: Reflexes are normal and symmetric.  Psychiatric:        Behavior: Behavior normal.        Thought Content: Thought content normal.        Judgment: Judgment normal.   Using a walker  Lab Results  Component Value Date   WBC 7.3 05/10/2023    HGB 14.2 05/10/2023   HCT 43.7 05/10/2023   PLT 146 (L) 05/10/2023   GLUCOSE 93 05/10/2023   CHOL 102 01/28/2023   TRIG 68 01/28/2023   HDL 51 01/28/2023   LDLDIRECT 37 01/28/2023   LDLCALC 36 01/28/2023   ALT 17 05/10/2023   AST 22 05/10/2023   NA 136 05/10/2023   K 4.2 05/10/2023   CL 101 05/10/2023   CREATININE 1.36 (H) 05/10/2023   BUN 23 05/10/2023   CO2 27 05/10/2023   TSH 14.031 (H) 09/06/2022   PSA 1.08 06/26/2020   INR 1.1 09/05/2022   HGBA1C 5.8 (H) 02/06/2013    CT Renal Stone Study  Result Date: 05/10/2023 CLINICAL DATA:  Abdominal pain, flank pain EXAM: CT ABDOMEN AND PELVIS WITHOUT CONTRAST TECHNIQUE: Multidetector CT imaging of the abdomen and pelvis was performed following the standard protocol without IV contrast. RADIATION DOSE REDUCTION: This exam was performed according to the departmental dose-optimization program which includes automated exposure control, adjustment of the mA and/or kV according to patient size and/or use of iterative reconstruction technique. COMPARISON:  09/05/2022 FINDINGS: Lower chest: Small linear densities are seen in the lower lung fields suggesting scarring or subsegmental atelectasis. There is no pleural effusion. Hepatobiliary: No focal abnormalities are seen in the liver. There is no dilation of bile ducts. Gallbladder is unremarkable. Pancreas: No focal abnormalities are seen. Spleen: Unremarkable. Adrenals/Urinary Tract: There is 2.1 cm low-density nodule in right adrenal. There is 1.7 cm low-density nodule in left adrenal. These findings have not changed significantly suggesting possible adenomas. There is no hydronephrosis in the left kidney. There is 3 mm calculus in the midportion of left kidney. There is a 2.2 cm  cyst in the posterior midportion of left kidney. There is mild right hydronephrosis. There is interval appearance of multiple right renal stones measuring up to 7 mm in diameter. There is increase attenuation in the lumen  of right renal pelvis. In image 56 of series 2, there is here 3 mm calcific density in the posterior margin of right ureter. In heavy 71 of series 2, there is a 3 mm calcific density adjacent to the distal course of right ureter. Beam hardening artifacts caused by surgical hardware in right hip limited evaluation of portions of pelvis and urinary bladder. Stomach/Bowel: Stomach is not distended. Mild diffuse wall thickening in the stomach may be due to incomplete distention. Small bowel loops are not dilated. Appendix is not seen. There is no pericecal inflammation. There is no significant wall thickening in colon. There is no pericolic stranding. Vascular/Lymphatic: Arterial calcifications are seen in aorta and its major branches. Coronary artery calcifications are seen. Reproductive: Evaluation is limited due to beam hardening artifacts. Other: There is no ascites or pneumoperitoneum. Umbilical hernia containing fat is seen. Bilateral inguinal hernias containing fat are seen, larger on the left side. Musculoskeletal: There are old fractures in multiple lower ribs and the transverse process of T10 vertebra with no significant change. There is marked compression fracture in body of L1 vertebra with retropulsion causing spinal stenosis. This finding has not changed. Fractures with sclerotic margins are seen in the right transverse processes of L3 and L4 vertebrae may be residual from previous injury. This finding was not evident on 09/05/2022. There is previous right hip arthroplasty. IMPRESSION: There is no evidence of intestinal obstruction or pneumoperitoneum. Mild right hydronephrosis. There are small calcifications in the distal courses of the right ureter which may suggest vascular calcifications lying immediately adjacent to the ureter or small right ureteral stones. Multiple bilateral renal stones, more so in the right kidney with significant interval increase in number. There is increased density in the  lumen of right renal pelvis without discrete margins suggesting possible confluence of numerous tiny renal stones or blood in the lumen of right renal pelvis or soft tissue lesion in the right renal pelvis. Short-term follow-up multiphasic CT may be considered. Fractures are seen in right transverse processes of L3 and L4 vertebrae with sclerotic margins. This finding suggests possible ununited chronic fractures. However, these fracture lines were not evident in the previous study done on 09/05/2022. Severe compression fracture of L1 vertebra with retropulsion has not changed significantly. There are old fractures in multiple lower ribs and spinous process of T10 vertebra. Low-density nodules in both adrenals suggest possible adenomas. Left renal cyst. Aortic arteriosclerosis. Coronary artery calcifications are seen. Umbilical and bilateral inguinal hernias containing fat. Other findings as described in the body of the report. Electronically Signed   By: Ernie Avena M.D.   On: 05/10/2023 14:58    Assessment & Plan:   Problem List Items Addressed This Visit     Longstanding persistent atrial fibrillation (HCC)    Cont w/Xarelto      Hypothyroidism - Primary (Chronic)    On levothyroxine 125 mcg a day Labs at [facility]      Low testosterone, possible hypogonadism (Chronic)    Labs at [facility]      Essential hypertension (Chronic)    BP Readings from Last 3 Encounters:  05/23/23 122/60  05/10/23 (!) 149/107  04/19/23 124/78         CAD (coronary artery disease)    F/u Dr Duke Salvia Cont w/Xarelto,  Zetia, Imdur      Low back pain radiating to both legs (Chronic)    Cont on Norco prn  Potential benefits of a long term opioids use as well as potential risks (i.e. addiction risk, apnea etc) and complications (i.e. Somnolence, constipation and others) were explained to the patient and were aknowledged.  Pt was in PT at home - finished      Relevant Medications   HYDROcodone-acetaminophen  (NORCO/VICODIN) 5-325 MG tablet   Diffuse large B-cell lymphoma (HCC)    On chemo q 2 wks now at [facility]      Relevant Medications   HYDROcodone-acetaminophen (NORCO/VICODIN) 5-325 MG tablet      No orders of the defined types were placed in this encounter.     Follow-up: Return in about 6 months (around 11/23/2023) for Wellness Exam.  Sonda Primes, MD

## 2023-05-23 NOTE — Assessment & Plan Note (Signed)
Cont on Norco prn  Potential benefits of a long term opioids use as well as potential risks (i.e. addiction risk, apnea etc) and complications (i.e. Somnolence, constipation and others) were explained to the patient and were aknowledged.  Pt was in PT at home - finished

## 2023-05-23 NOTE — Assessment & Plan Note (Signed)
F/u Dr Oval Linsey Cont w/Xarelto, Zetia, Imdur

## 2023-05-23 NOTE — Assessment & Plan Note (Signed)
BP Readings from Last 3 Encounters:  05/23/23 122/60  05/10/23 (!) 149/107  04/19/23 124/78

## 2023-05-30 ENCOUNTER — Encounter: Payer: Self-pay | Admitting: Infectious Disease

## 2023-05-30 ENCOUNTER — Other Ambulatory Visit: Payer: Self-pay | Admitting: Internal Medicine

## 2023-05-30 DIAGNOSIS — F410 Panic disorder [episodic paroxysmal anxiety] without agoraphobia: Secondary | ICD-10-CM | POA: Diagnosis not present

## 2023-05-30 DIAGNOSIS — F331 Major depressive disorder, recurrent, moderate: Secondary | ICD-10-CM | POA: Diagnosis not present

## 2023-05-30 MED ORDER — ALLOPURINOL 100 MG PO TABS: 100.0000 mg | ORAL_TABLET | Freq: Every day | ORAL | 3 refills | Status: DC

## 2023-05-30 NOTE — Telephone Encounter (Signed)
Medication is on list never been filled by MD , Pls advise.Marland KitchenRaechel Chute

## 2023-06-01 DIAGNOSIS — Z5111 Encounter for antineoplastic chemotherapy: Secondary | ICD-10-CM | POA: Diagnosis not present

## 2023-06-01 DIAGNOSIS — C8330 Diffuse large B-cell lymphoma, unspecified site: Secondary | ICD-10-CM | POA: Diagnosis not present

## 2023-06-01 DIAGNOSIS — Z9221 Personal history of antineoplastic chemotherapy: Secondary | ICD-10-CM | POA: Diagnosis not present

## 2023-06-16 ENCOUNTER — Other Ambulatory Visit (HOSPITAL_BASED_OUTPATIENT_CLINIC_OR_DEPARTMENT_OTHER): Payer: Self-pay

## 2023-06-16 ENCOUNTER — Encounter (HOSPITAL_BASED_OUTPATIENT_CLINIC_OR_DEPARTMENT_OTHER): Payer: Self-pay | Admitting: Emergency Medicine

## 2023-06-16 ENCOUNTER — Other Ambulatory Visit: Payer: Self-pay

## 2023-06-16 ENCOUNTER — Emergency Department (HOSPITAL_BASED_OUTPATIENT_CLINIC_OR_DEPARTMENT_OTHER)
Admission: EM | Admit: 2023-06-16 | Discharge: 2023-06-16 | Disposition: A | Discharge status: Home / Self Care | Payer: Medicare PPO | Attending: Emergency Medicine | Admitting: Emergency Medicine

## 2023-06-16 DIAGNOSIS — N3001 Acute cystitis with hematuria: Secondary | ICD-10-CM | POA: Insufficient documentation

## 2023-06-16 DIAGNOSIS — J3089 Other allergic rhinitis: Secondary | ICD-10-CM | POA: Diagnosis not present

## 2023-06-16 DIAGNOSIS — J301 Allergic rhinitis due to pollen: Secondary | ICD-10-CM | POA: Diagnosis not present

## 2023-06-16 DIAGNOSIS — R300 Dysuria: Secondary | ICD-10-CM | POA: Diagnosis present

## 2023-06-16 LAB — COMPREHENSIVE METABOLIC PANEL
ALT: 18 U/L (ref 0–44)
AST: 25 U/L (ref 15–41)
Albumin: 3.7 g/dL (ref 3.5–5.0)
Alkaline Phosphatase: 55 U/L (ref 38–126)
Anion gap: 8 (ref 5–15)
BUN: 30 mg/dL — ABNORMAL HIGH (ref 8–23)
CO2: 26 mmol/L (ref 22–32)
Calcium: 8.7 mg/dL — ABNORMAL LOW (ref 8.9–10.3)
Chloride: 101 mmol/L (ref 98–111)
Creatinine, Ser: 1.53 mg/dL — ABNORMAL HIGH (ref 0.61–1.24)
GFR, Estimated: 47 mL/min — ABNORMAL LOW (ref >60)
Glucose, Bld: 80 mg/dL (ref 70–99)
Potassium: 4.8 mmol/L (ref 3.5–5.1)
Sodium: 135 mmol/L (ref 135–145)
Total Bilirubin: 0.8 mg/dL (ref 0.3–1.2)
Total Protein: 5.9 g/dL — ABNORMAL LOW (ref 6.5–8.1)

## 2023-06-16 LAB — URINALYSIS, ROUTINE W REFLEX MICROSCOPIC
Bilirubin Urine: NEGATIVE
Glucose, UA: NEGATIVE mg/dL
Ketones, ur: NEGATIVE mg/dL
Nitrite: NEGATIVE
Protein, ur: NEGATIVE mg/dL
Specific Gravity, Urine: 1.015 (ref 1.005–1.030)
pH: 7 (ref 5.0–8.0)

## 2023-06-16 LAB — CBC WITH DIFFERENTIAL/PLATELET
Abs Immature Granulocytes: 0.06 10*3/uL (ref 0.00–0.07)
Basophils Absolute: 0 10*3/uL (ref 0.0–0.1)
Basophils Relative: 1 %
Eosinophils Absolute: 0.1 10*3/uL (ref 0.0–0.5)
Eosinophils Relative: 2 %
HCT: 44.2 % (ref 39.0–52.0)
Hemoglobin: 14.5 g/dL (ref 13.0–17.0)
Immature Granulocytes: 1 %
Lymphocytes Relative: 19 %
Lymphs Abs: 1.1 10*3/uL (ref 0.7–4.0)
MCH: 32.1 pg (ref 26.0–34.0)
MCHC: 32.8 g/dL (ref 30.0–36.0)
MCV: 97.8 fL (ref 80.0–100.0)
Monocytes Absolute: 0.4 10*3/uL (ref 0.1–1.0)
Monocytes Relative: 7 %
Neutro Abs: 4.1 10*3/uL (ref 1.7–7.7)
Neutrophils Relative %: 70 %
Platelets: 153 10*3/uL (ref 150–400)
RBC: 4.52 MIL/uL (ref 4.22–5.81)
RDW: 17.9 % — ABNORMAL HIGH (ref 11.5–15.5)
WBC: 5.8 10*3/uL (ref 4.0–10.5)
nRBC: 0 % (ref 0.0–0.2)

## 2023-06-16 MED ORDER — FLUCONAZOLE 200 MG PO TABS: 200.0000 mg | ORAL_TABLET | Freq: Once | ORAL | Status: DC

## 2023-06-16 MED ORDER — FLUCONAZOLE 150 MG PO TABS
150.0000 mg | ORAL_TABLET | Freq: Once | ORAL | Status: AC
  Administered 2023-06-16: 150 mg via ORAL
  Filled 2023-06-16: qty 1

## 2023-06-16 MED ORDER — SODIUM CHLORIDE 0.9 % IV SOLN
1.0000 g | Freq: Once | INTRAVENOUS | Status: AC
  Administered 2023-06-16: 1 g via INTRAVENOUS
  Filled 2023-06-16: qty 10

## 2023-06-16 MED ORDER — CEPHALEXIN 500 MG PO CAPS: 500.0000 mg | ORAL_CAPSULE | Freq: Two times a day (BID) | ORAL | 0 refills | Status: DC

## 2023-06-16 NOTE — Discharge Instructions (Addendum)
Please follow-up with your primary care doctor, you have evidence of urinary tract infection.  I have prescribed you Keflex for 7 days, please take this as prescribed.  Also you had some yeast in your urine, and you was given you are given 1 dose of yeast medicine, here in the ER, however if you have recurrent symptoms, please talk to your primary care doctor regarding this.

## 2023-06-16 NOTE — ED Provider Notes (Signed)
[place] EMERGENCY DEPARTMENT AT [facility] Provider Note   CSN: [id number] Arrival date & time: 06/16/23  3474     History  Chief Complaint  Patient presents with   Urinary Tract Infection    WAITMAN GAMMAGE is a 77 y.o. male, history of diffuse large B cell lymphoma, recurrent UTIs, nephrolithiasis, who presents to the ED secondary to intermittent dysuria over the last 2 weeks.  He states that for the last 2 weeks, has been painful to urinate occasionally, and that he has been noticing it is becoming more frequent.  He has a history of recurrent UTIs, and wants to catch it before it is too late.  Before he has been hospitalized for his UTIs, but now he does not have any chills, flank pain, abdominal pain, back pain, fevers, or confusion.  He reports his urine is mostly yellow, but does have occasional blood in it.  He notes that he does wear depends from time to times, because he does have some urinary incontinence.  Is currently on chemotherapy.   Home Medications Prior to Admission medications   Medication Sig Start Date End Date Taking? Authorizing Provider  ARNUITY ELLIPTA 100 MCG/ACT AEPB Inhale 1 puff into the lungs daily.  on any day albuterol is required for 30 days 05/25/23  Yes [provider]  cephALEXin (KEFLEX) 500 MG capsule Take 1 capsule (500 mg total) by mouth 2 (two) times daily. 06/16/23  Yes Camdin Hegner L, PA  acetaminophen (TYLENOL) 325 MG tablet Take 2 tablets (650 mg total) by mouth every 6 (six) hours as needed for mild pain (or Fever >/= 101). 03/25/22   Marguerita Merles Latif, DO  acyclovir (ZOVIRAX) 400 MG tablet Take 400 mg by mouth 2 (two) times daily. 08/11/22   [provider]  albuterol (VENTOLIN HFA) 108 (90 Base) MCG/ACT inhaler Inhale 2 puffs into the lungs every 6 (six) hours as needed for wheezing or shortness of breath.    [provider]  alfuzosin (UROXATRAL) 10 MG 24 hr tablet TAKE 1 TABLET AT BEDTIME 08/10/22    McKenzie, Mardene Celeste, MD  allopurinol (ZYLOPRIM) 100 MG tablet Take 1 tablet (100 mg total) by mouth daily. 05/30/23   Plotnikov, Georgina Quint, MD  amoxicillin (AMOXIL) 500 MG capsule TAKE 4 CAPSULES BY MOUTH ONE HOUR PRIOR TO DENTAL APPOINTMENT/PROCEDURE. 10/26/22   [provider]  atorvastatin (LIPITOR) 80 MG tablet TAKE 1 TABLET EVERY DAY Patient taking differently: Take 80 mg by mouth at bedtime. 08/09/22   Alver Sorrow, NP  buPROPion (WELLBUTRIN XL) 300 MG 24 hr tablet TAKE 1 TABLET EVERY DAY WITH BREAKFAST 05/09/23   Plotnikov, Georgina Quint, MD  Calcium Citrate-Vitamin D (CALCIUM CITRATE + D PO) Take 1 tablet by mouth 2 (two) times daily. 1200mg  of Calcium and 1000 units vitamin D3    [provider]  cefadroxil (DURICEF) 500 MG capsule Take 1 capsule (500 mg total) by mouth 2 (two) times daily. 04/19/23   Peter Garter, PA  Cholecalciferol (VITAMIN D) 50 MCG (2000 UT) tablet Take 4,000 Units by mouth in the morning and at bedtime.    [provider]  clonazePAM (KLONOPIN) 1 MG tablet TAKE 1 TABLET THREE TIMES DAILY AS NEEDED FOR ANXIETY 05/04/23   Plotnikov, Georgina Quint, MD  CRANBERRY PO Take 1 tablet by mouth in the morning and at bedtime.    [provider]  denosumab (PROLIA) 60 MG/ML SOSY injection Inject 60 mg as directed every 6 (six)  months. 12/10/15   [provider]  DULoxetine (CYMBALTA) 60 MG capsule TAKE 1 CAPSULE TWICE DAILY 05/09/23   Plotnikov, Georgina Quint, MD  EPINEPHrine 0.3 mg/0.3 mL IJ SOAJ injection Inject 0.3 mg into the muscle once as needed for anaphylaxis (severe allergic reaction). 07/31/21   [provider]  ezetimibe (ZETIA) 10 MG tablet TAKE 1 TABLET EVERY DAY 05/09/23   Chilton Si, MD  feeding supplement (ENSURE ENLIVE / ENSURE PLUS) LIQD Take 237 mLs by mouth 2 (two) times daily between meals. Patient taking differently: Take 237 mLs by mouth daily. 03/25/22   Marguerita Merles Latif, DO  ferrous sulfate 325 (65 FE) MG  tablet Take 325 mg by mouth 2 (two) times daily with a meal.    [provider]  HYDROcodone-acetaminophen (NORCO/VICODIN) 5-325 MG tablet Take 1 tablet by mouth every 4 (four) hours as needed. 03/17/23   [provider]  lactobacillus acidophilus (BACID) TABS tablet Take 1 tablet by mouth daily.    [provider]  lidocaine-prilocaine (EMLA) cream Apply 1 Application topically as needed. Patient taking differently: Apply 1 Application topically as needed (For port access/chemotherapy). 04/21/22   Johney Maine, MD  loratadine (CLARITIN) 10 MG tablet Take 10 mg by mouth daily.    [provider]  Magnesium 250 MG TABS Take 250 mg by mouth daily.    [provider]  metoprolol tartrate (LOPRESSOR) 25 MG tablet Take 25 mg by mouth 2 (two) times daily.    [provider]  Multiple Vitamin (MULTIVITAMIN WITH MINERALS) TABS tablet Take 1 tablet by mouth every morning. Men's One-A-Day 50+    [provider]  MYRBETRIQ 25 MG TB24 tablet TAKE 1 TABLET EVERY DAY Patient taking differently: Take 25 mg by mouth at bedtime. 10/06/21   McKenzie, Mardene Celeste, MD  nitroGLYCERIN (NITROSTAT) 0.4 MG SL tablet Place 0.4 mg under the tongue every 5 (five) minutes as needed for chest pain.    [provider]  omeprazole (PRILOSEC) 40 MG capsule TAKE 1 CAPSULE EVERY DAY BEFORE BREAKFAST 05/09/23   Plotnikov, Georgina Quint, MD  Potassium Citrate 15 MEQ (1620 MG) TBCR Take 1 tablet by mouth See admin instructions. Take one tablet (15 meq) by mouth twice daily - with lunch and supper Patient taking differently: Take 1 tablet by mouth in the morning and at bedtime. 06/10/22   McKenzie, Mardene Celeste, MD  Probiotic Product (PROBIOTIC PO) Take 1 capsule by mouth daily with breakfast.    [provider]  Risankizumab-rzaa [facility] [place]) Inject into the skin. On hold Patient not taking: Reported on 02/01/2023    [provider]  solifenacin  (VESICARE) 10 MG tablet Take 10 mg by mouth daily.    [provider]  Specialty Vitamins Products (MG PLUS PROTEIN) 133 MG TABS Take 1 tablet by mouth 3 (three) times daily. 11/22/22   [provider]  tamsulosin (FLOMAX) 0.4 MG CAPS capsule Take 1 capsule by mouth daily.    [provider]  testosterone enanthate (DELATESTRYL) 200 MG/ML injection INJECT (200MG ) INTO THE MUSCLE EVERY 14 DAYS. DISCARD VIAL AFTER 28 DAYS. Patient taking differently: Inject 200 mg into the muscle every 14 (fourteen) days. Every other Friday 09/22/21   Plotnikov, Georgina Quint, MD  vitamin C (ASCORBIC ACID) 500 MG tablet Take 500 mg by mouth daily with lunch.    [provider]      Allergies    Short ragweed pollen ext    Review of Systems  Review of Systems  Genitourinary:  Positive for dysuria. Negative for difficulty urinating.    Physical Exam Updated Vital Signs BP (!) 154/92   Pulse 86   Temp 98.1 F (36.7 C) (Oral)   Resp 15   Ht 6\' 3"  (1.905 m)   Wt 108.9 kg   SpO2 99%   BMI 30.00 kg/m  Physical Exam Vitals and nursing note reviewed.  Constitutional:      General: He is not in acute distress.    Appearance: He is well-developed.  HENT:     Head: Normocephalic and atraumatic.  Eyes:     Conjunctiva/sclera: Conjunctivae normal.  Cardiovascular:     Rate and Rhythm: Normal rate and regular rhythm.     Heart sounds: No murmur heard. Pulmonary:     Effort: Pulmonary effort is normal. No respiratory distress.     Breath sounds: Normal breath sounds.  Abdominal:     Palpations: Abdomen is soft.     Tenderness: There is no abdominal tenderness.  Musculoskeletal:        General: No swelling.     Cervical back: Neck supple.  Skin:    General: Skin is warm and dry.     Capillary Refill: Capillary refill takes less than 2 seconds.  Neurological:     Mental Status: He is alert.  Psychiatric:        Mood and Affect: Mood normal.     ED Results /  Procedures / Treatments   Labs (all labs ordered are listed, but only abnormal results are displayed) Labs Reviewed  URINALYSIS, ROUTINE W REFLEX MICROSCOPIC - Abnormal; Notable for the following components:      Result Value   Hgb urine dipstick MODERATE (*)    Leukocytes,Ua LARGE (*)    Bacteria, UA RARE (*)    Crystals PRESENT (*)    All other components within normal limits  CBC WITH DIFFERENTIAL/PLATELET - Abnormal; Notable for the following components:   RDW 17.9 (*)    All other components within normal limits  COMPREHENSIVE METABOLIC PANEL - Abnormal; Notable for the following components:   BUN 30 (*)    Creatinine, Ser 1.53 (*)    Calcium 8.7 (*)    Total Protein 5.9 (*)    GFR, Estimated 47 (*)    All other components within normal limits  URINE CULTURE    EKG None  Radiology No results found.  Procedures Procedures    Medications Ordered in ED Medications  fluconazole (DIFLUCAN) tablet 200 mg (has no administration in time range)  cefTRIAXone (ROCEPHIN) 1 g in sodium chloride 0.9 % 100 mL IVPB (1 g Intravenous New Bag/Given 06/16/23 1138)    ED Course/ Medical Decision Making/ A&P                                 Medical Decision Making Patient is a 77 year old male, history of lymphoma, currently undergoing treatment, here for dysuria for the last couple weeks.  He states is painful to urinate, denies any frequency urgency, no back pain, fevers, chills, or fatigue.  He is overall well-appearing.  He has no pain on exam.  We will obtain a urinalysis, is basic blood work, to evaluate kidney function.  Amount and/or Complexity of Data Reviewed Labs: ordered.    Details: Rare bacteria, many white blood cells, and red blood cells, many leukocytes in urine.  No leukocytosis, creatinine appears to be fairly  baseline Discussion of management or test interpretation with external provider(s): Discussed with patient, concerning for urinary tract infection given  symptoms, he does have some yeast in his urine, we will give him 1 dose of Diflucan here, have him follow-up with his PCP.  If symptoms are not improving with antibiotics, he may need to be treated further for yeast, in his urine.  We discussed return precautions and he voiced understanding, he is well-appearing at this time, and afebrile, nontachycardic, and has no abdominal pain, on exam.  Discharged home with strict return precautions  Risk Prescription drug management.   Final Clinical Impression(s) / ED Diagnoses Final diagnoses:  Acute cystitis with hematuria    Rx / DC Orders ED Discharge Orders          Ordered    cephALEXin (KEFLEX) 500 MG capsule  2 times daily        06/16/23 1230              Chritopher Coster L, PA 06/16/23 1234    Franne Forts, DO 06/21/23 0012

## 2023-06-16 NOTE — ED Triage Notes (Signed)
Pt arrives to ED with c/o possible UTI after feeling "off" and experienced dysuria for a few weeks.

## 2023-06-17 ENCOUNTER — Other Ambulatory Visit: Payer: Self-pay | Admitting: Urology

## 2023-06-17 ENCOUNTER — Other Ambulatory Visit (HOSPITAL_BASED_OUTPATIENT_CLINIC_OR_DEPARTMENT_OTHER): Payer: Self-pay | Admitting: Family

## 2023-06-17 LAB — URINE CULTURE: Culture: <10000 — AB

## 2023-06-21 DIAGNOSIS — M810 Age-related osteoporosis without current pathological fracture: Secondary | ICD-10-CM | POA: Diagnosis not present

## 2023-06-29 DIAGNOSIS — Z9221 Personal history of antineoplastic chemotherapy: Secondary | ICD-10-CM | POA: Diagnosis not present

## 2023-06-29 DIAGNOSIS — Z5111 Encounter for antineoplastic chemotherapy: Secondary | ICD-10-CM | POA: Diagnosis not present

## 2023-06-29 DIAGNOSIS — C8330 Diffuse large B-cell lymphoma, unspecified site: Secondary | ICD-10-CM | POA: Diagnosis not present

## 2023-07-05 ENCOUNTER — Emergency Department (HOSPITAL_BASED_OUTPATIENT_CLINIC_OR_DEPARTMENT_OTHER)
Admission: EM | Admit: 2023-07-05 | Discharge: 2023-07-05 | Disposition: A | Discharge status: Home / Self Care | Payer: Medicare PPO

## 2023-07-05 ENCOUNTER — Other Ambulatory Visit: Payer: Self-pay

## 2023-07-05 DIAGNOSIS — N3001 Acute cystitis with hematuria: Secondary | ICD-10-CM | POA: Diagnosis not present

## 2023-07-05 DIAGNOSIS — R319 Hematuria, unspecified: Secondary | ICD-10-CM | POA: Diagnosis present

## 2023-07-05 LAB — URINALYSIS, ROUTINE W REFLEX MICROSCOPIC
Bilirubin Urine: NEGATIVE
Glucose, UA: NEGATIVE mg/dL
Ketones, ur: NEGATIVE mg/dL
Nitrite: NEGATIVE
Protein, ur: 30 mg/dL — AB
RBC / HPF: >50 RBC/hpf (ref 0–5)
Specific Gravity, Urine: 1.019 (ref 1.005–1.030)
WBC, UA: >50 WBC/hpf (ref 0–5)
pH: 6.5 (ref 5.0–8.0)

## 2023-07-05 MED ORDER — CEPHALEXIN 500 MG PO CAPS: 500.0000 mg | ORAL_CAPSULE | Freq: Four times a day (QID) | ORAL | 0 refills | Status: AC

## 2023-07-05 NOTE — ED Notes (Signed)
Pt discharged in stable condition. Pt expressed understanding to follow up with pcp and to return to ER for any further concerns or complications. Pt ambulated out using walker with even steady gait, no apparent distress. Pt ate peanut butter crackers and finished drink prior to leaving.

## 2023-07-05 NOTE — ED Notes (Signed)
Gave urine cup. Patient does not have to urinate at this time.

## 2023-07-05 NOTE — ED Provider Notes (Signed)
[place] EMERGENCY DEPARTMENT AT [facility] Provider Note   CSN: [id number] Arrival date & time: 07/05/23  4098     History  Chief Complaint  Patient presents with   Hematuria    [name] is a 77 y.o. male.  77 year old male presenting emergency department for concern of UTI.  Noted he had some hematuria couple days ago, he has history of urinary incontinence and is wearing depends.  States he has some chronic lower extremity weakness that seemingly is worse when he has UTIs as he has a history of recurrent UTIs.  He is not having any flank pain, no nausea no vomiting.  No fevers.  He has no dysuria.  No penile discharge.  Reports he took a home UTI test and was positive, and he wanted to get antibiotics before symptoms worsened as he reports history of delirium secondary to UTI in the past.   Hematuria       Home Medications Prior to Admission medications   Medication Sig Start Date End Date Taking? Authorizing Provider  cephALEXin (KEFLEX) 500 MG capsule Take 1 capsule (500 mg total) by mouth 4 (four) times daily for 7 days. 07/05/23 07/12/23 Yes Sabriyah Wilcher, Harmon Dun, DO  acetaminophen (TYLENOL) 325 MG tablet Take 2 tablets (650 mg total) by mouth every 6 (six) hours as needed for mild pain (or Fever >/= 101). 03/25/22   Marguerita Merles Latif, DO  acyclovir (ZOVIRAX) 400 MG tablet Take 400 mg by mouth 2 (two) times daily. 08/11/22   [provider]  albuterol (VENTOLIN HFA) 108 (90 Base) MCG/ACT inhaler Inhale 2 puffs into the lungs every 6 (six) hours as needed for wheezing or shortness of breath.    [provider]  alfuzosin (UROXATRAL) 10 MG 24 hr tablet TAKE 1 TABLET AT BEDTIME 08/10/22   McKenzie, Mardene Celeste, MD  allopurinol (ZYLOPRIM) 100 MG tablet Take 1 tablet (100 mg total) by mouth daily. 05/30/23   Plotnikov, Georgina Quint, MD  amoxicillin (AMOXIL) 500 MG capsule TAKE 4 CAPSULES BY MOUTH ONE HOUR PRIOR TO DENTAL APPOINTMENT/PROCEDURE. 10/26/22    [provider]  ARNUITY ELLIPTA 100 MCG/ACT AEPB Inhale 1 puff into the lungs daily.  on any day albuterol is required for 30 days 05/25/23   [provider]  atorvastatin (LIPITOR) 80 MG tablet TAKE 1 TABLET EVERY DAY 06/17/23   Alver Sorrow, NP  buPROPion (WELLBUTRIN XL) 300 MG 24 hr tablet TAKE 1 TABLET EVERY DAY WITH BREAKFAST 05/09/23   Plotnikov, Georgina Quint, MD  Calcium Citrate-Vitamin D (CALCIUM CITRATE + D PO) Take 1 tablet by mouth 2 (two) times daily. 1200mg  of Calcium and 1000 units vitamin D3    [provider]  cefadroxil (DURICEF) 500 MG capsule Take 1 capsule (500 mg total) by mouth 2 (two) times daily. 04/19/23   Peter Garter, PA  Cholecalciferol (VITAMIN D) 50 MCG (2000 UT) tablet Take 4,000 Units by mouth in the morning and at bedtime.    [provider]  clonazePAM (KLONOPIN) 1 MG tablet TAKE 1 TABLET THREE TIMES DAILY AS NEEDED FOR ANXIETY 05/04/23   Plotnikov, Georgina Quint, MD  CRANBERRY PO Take 1 tablet by mouth in the morning and at bedtime.    [provider]  denosumab (PROLIA) 60 MG/ML SOSY injection Inject 60 mg as directed every 6 (six) months. 12/10/15   [provider]  DULoxetine (CYMBALTA) 60 MG capsule TAKE 1 CAPSULE TWICE DAILY 05/09/23   Plotnikov, Georgina Quint, MD  EPINEPHrine 0.3 mg/0.3 mL IJ SOAJ injection Inject 0.3 mg into the muscle once as needed for anaphylaxis (severe allergic reaction). 07/31/21   [provider]  ezetimibe (ZETIA) 10 MG tablet TAKE 1 TABLET EVERY DAY 05/09/23   Chilton Si, MD  feeding supplement (ENSURE ENLIVE / ENSURE PLUS) LIQD Take 237 mLs by mouth 2 (two) times daily between meals. Patient taking differently: Take 237 mLs by mouth daily. 03/25/22   Marguerita Merles Latif, DO  ferrous sulfate 325 (65 FE) MG tablet Take 325 mg by mouth 2 (two) times daily with a meal.    [provider]  HYDROcodone-acetaminophen (NORCO/VICODIN) 5-325 MG tablet Take 1 tablet by mouth  every 4 (four) hours as needed. 03/17/23   [provider]  lactobacillus acidophilus (BACID) TABS tablet Take 1 tablet by mouth daily.    [provider]  lidocaine-prilocaine (EMLA) cream Apply 1 Application topically as needed. Patient taking differently: Apply 1 Application topically as needed (For port access/chemotherapy). 04/21/22   Johney Maine, MD  loratadine (CLARITIN) 10 MG tablet Take 10 mg by mouth daily.    [provider]  Magnesium 250 MG TABS Take 250 mg by mouth daily.    [provider]  metoprolol tartrate (LOPRESSOR) 25 MG tablet TAKE 1 TABLET TWICE DAILY 06/17/23   Alver Sorrow, NP  Multiple Vitamin (MULTIVITAMIN WITH MINERALS) TABS tablet Take 1 tablet by mouth every morning. Men's One-A-Day 50+    [provider]  MYRBETRIQ 25 MG TB24 tablet TAKE 1 TABLET EVERY DAY Patient taking differently: Take 25 mg by mouth at bedtime. 10/06/21   McKenzie, Mardene Celeste, MD  nitroGLYCERIN (NITROSTAT) 0.4 MG SL tablet Place 0.4 mg under the tongue every 5 (five) minutes as needed for chest pain.    [provider]  omeprazole (PRILOSEC) 40 MG capsule TAKE 1 CAPSULE EVERY DAY BEFORE BREAKFAST 05/09/23   Plotnikov, Georgina Quint, MD  Potassium Citrate 15 MEQ (1620 MG) TBCR Take 1 tablet by mouth in the morning and at bedtime. 06/21/23   McKenzie, Mardene Celeste, MD  Probiotic Product (PROBIOTIC PO) Take 1 capsule by mouth daily with breakfast.    [provider]  Risankizumab-rzaa [facility]) Inject into the skin. On hold Patient not taking: Reported on 02/01/2023    [provider]  solifenacin (VESICARE) 10 MG tablet Take 10 mg by mouth daily.    [provider]  Specialty Vitamins Products (MG PLUS PROTEIN) 133 MG TABS Take 1 tablet by mouth 3 (three) times daily. 11/22/22   [provider]  tamsulosin (FLOMAX) 0.4 MG CAPS capsule Take 1 capsule by mouth daily.    [provider]  testosterone  enanthate (DELATESTRYL) 200 MG/ML injection INJECT (200MG ) INTO THE MUSCLE EVERY 14 DAYS. DISCARD VIAL AFTER 28 DAYS. Patient taking differently: Inject 200 mg into the muscle every 14 (fourteen) days. Every other Friday 09/22/21   Plotnikov, Georgina Quint, MD  vitamin C (ASCORBIC ACID) 500 MG tablet Take 500 mg by mouth daily with lunch.    [provider]      Allergies    Short ragweed pollen ext    Review of Systems   Review of Systems  Genitourinary:  Positive for hematuria.    Physical Exam Updated Vital Signs BP 127/64 (BP Location: Right Arm)   Pulse 81   Temp 98.7 F (37.1 C) (Oral)   Resp 18   Ht 6\' 4"  (1.93 m)   Wt 108.9 kg  SpO2 98%   BMI 29.21 kg/m  Physical Exam Vitals and nursing note reviewed.  HENT:     Head: Normocephalic.     Mouth/Throat:     Mouth: Mucous membranes are moist.  Eyes:     Conjunctiva/sclera: Conjunctivae normal.  Cardiovascular:     Rate and Rhythm: Normal rate.  Pulmonary:     Effort: Pulmonary effort is normal.  Abdominal:     General: Abdomen is flat. There is no distension.     Palpations: Abdomen is soft.     Tenderness: There is no abdominal tenderness.  Musculoskeletal:        General: Normal range of motion.  Skin:    General: Skin is warm.     Capillary Refill: Capillary refill takes less than 2 seconds.  Neurological:     Mental Status: He is alert and oriented to person, place, and time.  Psychiatric:        Mood and Affect: Mood normal.        Behavior: Behavior normal.     ED Results / Procedures / Treatments   Labs (all labs ordered are listed, but only abnormal results are displayed) Labs Reviewed  URINALYSIS, ROUTINE W REFLEX MICROSCOPIC - Abnormal; Notable for the following components:      Result Value   APPearance HAZY (*)    Hgb urine dipstick LARGE (*)    Protein, ur 30 (*)    Leukocytes,Ua LARGE (*)    Bacteria, UA RARE (*)    All other components within normal limits     EKG None  Radiology No results found.  Procedures Procedures    Medications Ordered in ED Medications - No data to display  ED Course/ Medical Decision Making/ A&P                                 Medical Decision Making Well-appearing 77 year old male presenting emergency department for possible UTI.  Afebrile vital signs reassuring.  Physical exam with soft benign abdomen.  History of recurrent UTIs.  Largely asymptomatic currently but states this feels like one of his recurrent UTIs.  Ordered labs on triage, however after discussion patient low suspicion for systemic infection.  She is not currently having hematuria and making good urine without obstruction.  Low suspicion for AKI.  He reports that he follows in the next several days with his primary doctor and cancer doctor and they do blood test at that time.  Amount and/or Complexity of Data Reviewed External Data Reviewed:     Details: Per chart review history of E faecalis UTI on July sensitive to nitrofurantoin, however urine culture 9/5 with no significant pathogen. Given local resistance and previous negative culture, will give cephalexin.   Risk Prescription drug management.          Final Clinical Impression(s) / ED Diagnoses Final diagnoses:  Acute cystitis with hematuria    Rx / DC Orders ED Discharge Orders          Ordered    cephALEXin (KEFLEX) 500 MG capsule  4 times daily        07/05/23 1046              Coral Spikes, [place] 07/05/23 1508

## 2023-07-05 NOTE — ED Triage Notes (Signed)
Pt arrived via POV, reports hx of UTI, noted blood in urine and bilat leg weakness, sx started 1-2 wks ago, similar to sx experienced with past UTI's.

## 2023-07-12 DIAGNOSIS — F419 Anxiety disorder, unspecified: Secondary | ICD-10-CM | POA: Diagnosis not present

## 2023-07-12 DIAGNOSIS — F331 Major depressive disorder, recurrent, moderate: Secondary | ICD-10-CM | POA: Diagnosis not present

## 2023-07-12 DIAGNOSIS — F410 Panic disorder [episodic paroxysmal anxiety] without agoraphobia: Secondary | ICD-10-CM | POA: Diagnosis not present

## 2023-07-16 ENCOUNTER — Other Ambulatory Visit (HOSPITAL_BASED_OUTPATIENT_CLINIC_OR_DEPARTMENT_OTHER): Payer: Self-pay | Admitting: Family

## 2023-07-18 ENCOUNTER — Encounter (HOSPITAL_BASED_OUTPATIENT_CLINIC_OR_DEPARTMENT_OTHER): Payer: Self-pay | Admitting: Family

## 2023-07-18 ENCOUNTER — Ambulatory Visit (HOSPITAL_BASED_OUTPATIENT_CLINIC_OR_DEPARTMENT_OTHER): Payer: Medicare PPO | Admitting: Family

## 2023-07-18 VITALS — BP 134/86 | HR 100 | Ht 76.0 in | Wt 241.4 lb

## 2023-07-18 DIAGNOSIS — E785 Hyperlipidemia, unspecified: Secondary | ICD-10-CM | POA: Diagnosis not present

## 2023-07-18 DIAGNOSIS — I480 Paroxysmal atrial fibrillation: Secondary | ICD-10-CM | POA: Diagnosis not present

## 2023-07-18 DIAGNOSIS — I10 Essential (primary) hypertension: Secondary | ICD-10-CM | POA: Diagnosis not present

## 2023-07-18 DIAGNOSIS — J3089 Other allergic rhinitis: Secondary | ICD-10-CM | POA: Diagnosis not present

## 2023-07-18 DIAGNOSIS — I2510 Atherosclerotic heart disease of native coronary artery without angina pectoris: Secondary | ICD-10-CM | POA: Diagnosis not present

## 2023-07-18 DIAGNOSIS — I5022 Chronic systolic (congestive) heart failure: Secondary | ICD-10-CM

## 2023-07-18 DIAGNOSIS — J3081 Allergic rhinitis due to animal (cat) (dog) hair and dander: Secondary | ICD-10-CM | POA: Diagnosis not present

## 2023-07-18 DIAGNOSIS — J301 Allergic rhinitis due to pollen: Secondary | ICD-10-CM | POA: Diagnosis not present

## 2023-07-18 DIAGNOSIS — I4811 Longstanding persistent atrial fibrillation: Secondary | ICD-10-CM

## 2023-07-18 MED ORDER — ATORVASTATIN CALCIUM 80 MG PO TABS: 80.0000 mg | ORAL_TABLET | Freq: Every day | ORAL | 3 refills | Status: DC

## 2023-07-18 NOTE — Progress Notes (Signed)
as well as chronic total occlusion of the mid RCA with bridging and left-to-right collaterals. 2. Mildly elevated left heart and pulmonary artery pressures.  Prominent v-waves noted on PCWP; question if mitral regurgitation is more severe than appreciated on recent transthoracic  echocardiogram. 3. Moderately elevated right heart filling pressures. 4. Normal Fick cardiac output/index.  Recommendations: 1. Outpatient cardiac surgery evaluation, given significant two vessel coronary artery disease including proximal LAD involvement and CTO of the RCA.  If comorbidities pose an excessively high perioperative risk, I would favor maximizing medical therapy.  If the patient has refractory symptoms, PCI to the proximal/mid LAD and D2 could be considered.  I would defer PCI to CTO of the RCA, as this has reportedly been present for 20+ years. 2. Consider gentle diuresis and continued medical management of chronic HFpEF and persistent atrial fibrillation. 3. Aggressive secondary prevention.  Yvonne Kendall, MD [facility] Pager: [phone]  Findings Coronary Findings Diagnostic  Dominance: Right  Left Main Vessel is large. Vessel is angiographically normal.  Left Anterior Descending Prox LAD lesion is 60% stenosed. Pressure wire/FFR was performed on the lesion. DFR = 0.87. Mid LAD lesion is 70% stenosed. The lesion is eccentric. Pressure wire/FFR was performed on the lesion. DFR = 0.81.  First Diagonal Branch Vessel is small in size.  Second Diagonal Branch Vessel is large in size.  Left Circumflex Vessel is large. Mid Cx lesion is 20% stenosed.  First Obtuse Marginal Branch Vessel is moderate in size.  Second Obtuse Marginal Branch Vessel is large in size.  Third Obtuse Marginal Branch Vessel is large in size.  Right Coronary Artery Vessel is large. Collaterals Mid RCA filled by collaterals from Prox RCA.  Prox RCA to Mid RCA lesion is 99% stenosed. The lesion is chronically occluded with bridging and left-to-right collateral flow.  Right Posterior Atrioventricular Artery Collaterals RPAV filled by collaterals from Dist Cx.  Intervention  No interventions have been documented.     ECHOCARDIOGRAM  ECHOCARDIOGRAM COMPLETE  09/06/2022  Narrative ECHOCARDIOGRAM REPORT    Patient Name:   [name] Date of Exam: 09/06/2022 Medical Rec #:  [id number]        Height:       76.0 in Accession #:    [id number]       Weight:       232.0 lb Date of Birth:  15-Apr-1946        BSA:          2.359 m Patient Age:    77 years         BP:           105/65 mmHg Patient Gender: M                HR:           90 bpm. Exam Location:  Inpatient  Procedure: 2D Echo, Cardiac Doppler and Color Doppler  Indications:    R55 Syncope  History:        Patient has prior history of Echocardiogram examinations, most recent 06/28/2019. CHF, CAD, Arrythmias:Atrial Fibrillation, Signs/Symptoms:Syncope; Risk Factors:Hypertension, Dyslipidemia and GERD.  Sonographer:    Eulah Pont RDCS Referring Phys: Kenn File DOUTOVA   Sonographer Comments: No parasternal window. IMPRESSIONS   1. Left ventricular ejection fraction, by estimation, is 35 to 40%. The left ventricle has moderately decreased function. The left ventricle demonstrates global hypokinesis. Left ventricular diastolic parameters are indeterminate. 2. Right ventricular systolic function is mildly reduced. The right ventricular  Cardiology Office Note:  .   Date:  07/18/2023  ID:  [name], DOB May 30, 1946, MRN [id number] PCP: Tresa Garter, MD  [facility] Providers Cardiologist:  Chilton Si, MD Cardiology APP:  Alver Sorrow, NP    History of Present Illness: .   [name] is a 77 y.o. male with a hx of relapsed large B-cell lymphoma, CAD, recurrent UTI, atrial fibrillation, BPH, diastolic heart failure, hypertension, hyperlipidemia, OSA, hypothyroid, prior tobacco use.   Initially diagnosed atrial fibrillation around 2012 and started on diltiazem and amiodarone.  Amiodarone discontinued due to persistent atrial fibrillation.  Underwent LHC at time of diagnosis with reported 90% obstructive of unknown vessel that developed collaterals.  He was started on Ranexa.  Echo 10/2016 EF 60 to 65%, severe bilateral atrial enlargement, IVC dilated.  He had no angina so Ranexa discontinued.   Diagnosed with diffuse large B-cell lymphoma 05/2018.  Completed 6 cycles of chemotherapy.  Based on echo 06/2018 EF 50 to 55%, normal diastolic function, apical hypokinesis.  Heart rate poorly controlled to metoprolol added.  Seen in the ED and BNP elevated to Lasix 20 mg daily added with improvement in breathing.  Echo 06/2019 EF 50-55%, RV mildly reduced systolic function.  Lasix was transitioned to hydrochlorothiazide due to urinary frequency and poor blood pressure control.  LHC 08/2019 significant two-vessel CAD sequential 60 to 70% mid LAD lesions and CTO of mid RCA with bridging left-to-right collaterals.  Consideration for two-vessel CABG versus medical management.  Deemed not candidate for surgery.  He was started on Imdur.  Due to kidney stones and urination HCTZ reduced and losartan increased.   03/22/22 chemo resumed due to recurrence of diffuse large b-cell lymphoma.    Admitted 7/13 - 04/27/2022 after presenting with fatigue, lightheadedness found to have severe sepsis treated with IVF and IV abx  due to urinary tract infection E. coli ESBL. Due to orthostatic hypotension and near syncope, diltiazem dose reduced. Xarelto and aspirin held due to thrombocytopenia. Seen 05/04/2022 by his oncology team and referred to ID  for recurrent UTI.  He was set up for IV ABX through home care.   He has been off Xarelto since 10/18/22 due to hematuria, frequent falls as risks outweighed benefits.    He presents today for follow-up independently. He is undergoing Epcoritamab bispecific for refractory DLBCL with [facility].  Denies chest pain, orthopnea, edema, PND.  Exertional dyspnea stable at baseline.  Reports heart rate at home routinely less than 100 bpm.  No palpitations.  Reports stamina and balance are improving but still using Aydenn Gervin for stability.  He is very excited to be back driving and that he was able to help care take for her assessment after back surgery a couple months ago.  ROS: Please see the history of present illness.    All other systems reviewed and are negative.   Studies Reviewed: [name]   EKG Interpretation Date/Time:  Monday July 18 2023 11:15:58 EDT Ventricular Rate:  100 PR Interval:    QRS Duration:  94 QT Interval:  332 QTC Calculation: 428 R Axis:   -67  Text Interpretation: Atrial fibrillation Left axis deviation Confirmed by Gillian Shields ([id number]) on 07/18/2023 11:28:39 AM    Cardiac Studies & Procedures   CARDIAC CATHETERIZATION  CARDIAC CATHETERIZATION 08/13/2019  Narrative Conclusions: 1. Significant two-vessel coronary artery disease, including sequential 60% proximal and 70% mid LAD stenosis, which are hemodynamically significant by DFR and also involve large D2 branch,  as well as chronic total occlusion of the mid RCA with bridging and left-to-right collaterals. 2. Mildly elevated left heart and pulmonary artery pressures.  Prominent v-waves noted on PCWP; question if mitral regurgitation is more severe than appreciated on recent transthoracic  echocardiogram. 3. Moderately elevated right heart filling pressures. 4. Normal Fick cardiac output/index.  Recommendations: 1. Outpatient cardiac surgery evaluation, given significant two vessel coronary artery disease including proximal LAD involvement and CTO of the RCA.  If comorbidities pose an excessively high perioperative risk, I would favor maximizing medical therapy.  If the patient has refractory symptoms, PCI to the proximal/mid LAD and D2 could be considered.  I would defer PCI to CTO of the RCA, as this has reportedly been present for 20+ years. 2. Consider gentle diuresis and continued medical management of chronic HFpEF and persistent atrial fibrillation. 3. Aggressive secondary prevention.  Yvonne Kendall, MD [facility] Pager: [phone]  Findings Coronary Findings Diagnostic  Dominance: Right  Left Main Vessel is large. Vessel is angiographically normal.  Left Anterior Descending Prox LAD lesion is 60% stenosed. Pressure wire/FFR was performed on the lesion. DFR = 0.87. Mid LAD lesion is 70% stenosed. The lesion is eccentric. Pressure wire/FFR was performed on the lesion. DFR = 0.81.  First Diagonal Branch Vessel is small in size.  Second Diagonal Branch Vessel is large in size.  Left Circumflex Vessel is large. Mid Cx lesion is 20% stenosed.  First Obtuse Marginal Branch Vessel is moderate in size.  Second Obtuse Marginal Branch Vessel is large in size.  Third Obtuse Marginal Branch Vessel is large in size.  Right Coronary Artery Vessel is large. Collaterals Mid RCA filled by collaterals from Prox RCA.  Prox RCA to Mid RCA lesion is 99% stenosed. The lesion is chronically occluded with bridging and left-to-right collateral flow.  Right Posterior Atrioventricular Artery Collaterals RPAV filled by collaterals from Dist Cx.  Intervention  No interventions have been documented.     ECHOCARDIOGRAM  ECHOCARDIOGRAM COMPLETE  09/06/2022  Narrative ECHOCARDIOGRAM REPORT    Patient Name:   [name] Date of Exam: 09/06/2022 Medical Rec #:  [id number]        Height:       76.0 in Accession #:    [id number]       Weight:       232.0 lb Date of Birth:  15-Apr-1946        BSA:          2.359 m Patient Age:    77 years         BP:           105/65 mmHg Patient Gender: M                HR:           90 bpm. Exam Location:  Inpatient  Procedure: 2D Echo, Cardiac Doppler and Color Doppler  Indications:    R55 Syncope  History:        Patient has prior history of Echocardiogram examinations, most recent 06/28/2019. CHF, CAD, Arrythmias:Atrial Fibrillation, Signs/Symptoms:Syncope; Risk Factors:Hypertension, Dyslipidemia and GERD.  Sonographer:    Eulah Pont RDCS Referring Phys: Kenn File DOUTOVA   Sonographer Comments: No parasternal window. IMPRESSIONS   1. Left ventricular ejection fraction, by estimation, is 35 to 40%. The left ventricle has moderately decreased function. The left ventricle demonstrates global hypokinesis. Left ventricular diastolic parameters are indeterminate. 2. Right ventricular systolic function is mildly reduced. The right ventricular  as well as chronic total occlusion of the mid RCA with bridging and left-to-right collaterals. 2. Mildly elevated left heart and pulmonary artery pressures.  Prominent v-waves noted on PCWP; question if mitral regurgitation is more severe than appreciated on recent transthoracic  echocardiogram. 3. Moderately elevated right heart filling pressures. 4. Normal Fick cardiac output/index.  Recommendations: 1. Outpatient cardiac surgery evaluation, given significant two vessel coronary artery disease including proximal LAD involvement and CTO of the RCA.  If comorbidities pose an excessively high perioperative risk, I would favor maximizing medical therapy.  If the patient has refractory symptoms, PCI to the proximal/mid LAD and D2 could be considered.  I would defer PCI to CTO of the RCA, as this has reportedly been present for 20+ years. 2. Consider gentle diuresis and continued medical management of chronic HFpEF and persistent atrial fibrillation. 3. Aggressive secondary prevention.  Yvonne Kendall, MD [facility] Pager: [phone]  Findings Coronary Findings Diagnostic  Dominance: Right  Left Main Vessel is large. Vessel is angiographically normal.  Left Anterior Descending Prox LAD lesion is 60% stenosed. Pressure wire/FFR was performed on the lesion. DFR = 0.87. Mid LAD lesion is 70% stenosed. The lesion is eccentric. Pressure wire/FFR was performed on the lesion. DFR = 0.81.  First Diagonal Branch Vessel is small in size.  Second Diagonal Branch Vessel is large in size.  Left Circumflex Vessel is large. Mid Cx lesion is 20% stenosed.  First Obtuse Marginal Branch Vessel is moderate in size.  Second Obtuse Marginal Branch Vessel is large in size.  Third Obtuse Marginal Branch Vessel is large in size.  Right Coronary Artery Vessel is large. Collaterals Mid RCA filled by collaterals from Prox RCA.  Prox RCA to Mid RCA lesion is 99% stenosed. The lesion is chronically occluded with bridging and left-to-right collateral flow.  Right Posterior Atrioventricular Artery Collaterals RPAV filled by collaterals from Dist Cx.  Intervention  No interventions have been documented.     ECHOCARDIOGRAM  ECHOCARDIOGRAM COMPLETE  09/06/2022  Narrative ECHOCARDIOGRAM REPORT    Patient Name:   [name] Date of Exam: 09/06/2022 Medical Rec #:  [id number]        Height:       76.0 in Accession #:    [id number]       Weight:       232.0 lb Date of Birth:  15-Apr-1946        BSA:          2.359 m Patient Age:    77 years         BP:           105/65 mmHg Patient Gender: M                HR:           90 bpm. Exam Location:  Inpatient  Procedure: 2D Echo, Cardiac Doppler and Color Doppler  Indications:    R55 Syncope  History:        Patient has prior history of Echocardiogram examinations, most recent 06/28/2019. CHF, CAD, Arrythmias:Atrial Fibrillation, Signs/Symptoms:Syncope; Risk Factors:Hypertension, Dyslipidemia and GERD.  Sonographer:    Eulah Pont RDCS Referring Phys: Kenn File DOUTOVA   Sonographer Comments: No parasternal window. IMPRESSIONS   1. Left ventricular ejection fraction, by estimation, is 35 to 40%. The left ventricle has moderately decreased function. The left ventricle demonstrates global hypokinesis. Left ventricular diastolic parameters are indeterminate. 2. Right ventricular systolic function is mildly reduced. The right ventricular  Cardiology Office Note:  .   Date:  07/18/2023  ID:  [name], DOB May 30, 1946, MRN [id number] PCP: Tresa Garter, MD  [facility] Providers Cardiologist:  Chilton Si, MD Cardiology APP:  Alver Sorrow, NP    History of Present Illness: .   [name] is a 77 y.o. male with a hx of relapsed large B-cell lymphoma, CAD, recurrent UTI, atrial fibrillation, BPH, diastolic heart failure, hypertension, hyperlipidemia, OSA, hypothyroid, prior tobacco use.   Initially diagnosed atrial fibrillation around 2012 and started on diltiazem and amiodarone.  Amiodarone discontinued due to persistent atrial fibrillation.  Underwent LHC at time of diagnosis with reported 90% obstructive of unknown vessel that developed collaterals.  He was started on Ranexa.  Echo 10/2016 EF 60 to 65%, severe bilateral atrial enlargement, IVC dilated.  He had no angina so Ranexa discontinued.   Diagnosed with diffuse large B-cell lymphoma 05/2018.  Completed 6 cycles of chemotherapy.  Based on echo 06/2018 EF 50 to 55%, normal diastolic function, apical hypokinesis.  Heart rate poorly controlled to metoprolol added.  Seen in the ED and BNP elevated to Lasix 20 mg daily added with improvement in breathing.  Echo 06/2019 EF 50-55%, RV mildly reduced systolic function.  Lasix was transitioned to hydrochlorothiazide due to urinary frequency and poor blood pressure control.  LHC 08/2019 significant two-vessel CAD sequential 60 to 70% mid LAD lesions and CTO of mid RCA with bridging left-to-right collaterals.  Consideration for two-vessel CABG versus medical management.  Deemed not candidate for surgery.  He was started on Imdur.  Due to kidney stones and urination HCTZ reduced and losartan increased.   03/22/22 chemo resumed due to recurrence of diffuse large b-cell lymphoma.    Admitted 7/13 - 04/27/2022 after presenting with fatigue, lightheadedness found to have severe sepsis treated with IVF and IV abx  due to urinary tract infection E. coli ESBL. Due to orthostatic hypotension and near syncope, diltiazem dose reduced. Xarelto and aspirin held due to thrombocytopenia. Seen 05/04/2022 by his oncology team and referred to ID  for recurrent UTI.  He was set up for IV ABX through home care.   He has been off Xarelto since 10/18/22 due to hematuria, frequent falls as risks outweighed benefits.    He presents today for follow-up independently. He is undergoing Epcoritamab bispecific for refractory DLBCL with [facility].  Denies chest pain, orthopnea, edema, PND.  Exertional dyspnea stable at baseline.  Reports heart rate at home routinely less than 100 bpm.  No palpitations.  Reports stamina and balance are improving but still using Aydenn Gervin for stability.  He is very excited to be back driving and that he was able to help care take for her assessment after back surgery a couple months ago.  ROS: Please see the history of present illness.    All other systems reviewed and are negative.   Studies Reviewed: [name]   EKG Interpretation Date/Time:  Monday July 18 2023 11:15:58 EDT Ventricular Rate:  100 PR Interval:    QRS Duration:  94 QT Interval:  332 QTC Calculation: 428 R Axis:   -67  Text Interpretation: Atrial fibrillation Left axis deviation Confirmed by Gillian Shields ([id number]) on 07/18/2023 11:28:39 AM    Cardiac Studies & Procedures   CARDIAC CATHETERIZATION  CARDIAC CATHETERIZATION 08/13/2019  Narrative Conclusions: 1. Significant two-vessel coronary artery disease, including sequential 60% proximal and 70% mid LAD stenosis, which are hemodynamically significant by DFR and also involve large D2 branch,

## 2023-07-18 NOTE — Patient Instructions (Addendum)
Medication Instructions:  Continue your current medications.   *If you need a refill on your cardiac medications before your next appointment, please call your pharmacy*  Testing/Procedures: Your EKG today showed rate controlled atrial fibrillation.   Follow-Up: At [facility], you and your health needs are our priority.  As part of our continuing mission to provide you with exceptional heart care, we have created designated Provider Care Teams.  These Care Teams include your primary Cardiologist (physician) and Advanced Practice Providers (APPs -  Physician Assistants and Nurse Practitioners) who all work together to provide you with the care you need, when you need it.  We recommend signing up for the patient portal called "MyChart".  Sign up information is provided on this After Visit Summary.  MyChart is used to connect with patients for Virtual Visits (Telemedicine).  Patients are able to view lab/test results, encounter notes, upcoming appointments, etc.  Non-urgent messages can be sent to your provider as well.   To learn more about what you can do with MyChart, go to [email or web address].    Your next appointment:   6 month(s)  Provider:   Chilton Si, MD or Gillian Shields, NP    Other Instructions  Congratulations on your 4 pound weight loss!   Heart Healthy Diet Recommendations: A low-salt diet is recommended. Meats should be grilled, baked, or boiled. Avoid fried foods. Focus on lean protein sources like fish or chicken with vegetables and fruits. The American Heart Association is a Chief Technology Officer!  American Heart Association Diet and Lifeystyle Recommendations   Exercise recommendations: The American Heart Association recommends 150 minutes of moderate intensity exercise weekly. Try 30 minutes of moderate intensity exercise 4-5 times per week. This could include walking, jogging, or swimming.

## 2023-07-21 ENCOUNTER — Other Ambulatory Visit (HOSPITAL_BASED_OUTPATIENT_CLINIC_OR_DEPARTMENT_OTHER): Payer: Self-pay | Admitting: Family

## 2023-07-21 DIAGNOSIS — I2510 Atherosclerotic heart disease of native coronary artery without angina pectoris: Secondary | ICD-10-CM

## 2023-07-21 DIAGNOSIS — E785 Hyperlipidemia, unspecified: Secondary | ICD-10-CM

## 2023-07-22 ENCOUNTER — Other Ambulatory Visit: Payer: Self-pay | Admitting: Internal Medicine

## 2023-07-22 ENCOUNTER — Other Ambulatory Visit (HOSPITAL_BASED_OUTPATIENT_CLINIC_OR_DEPARTMENT_OTHER): Payer: Self-pay | Admitting: Family

## 2023-07-22 DIAGNOSIS — I2510 Atherosclerotic heart disease of native coronary artery without angina pectoris: Secondary | ICD-10-CM

## 2023-07-22 DIAGNOSIS — E785 Hyperlipidemia, unspecified: Secondary | ICD-10-CM

## 2023-07-27 DIAGNOSIS — C8330 Diffuse large B-cell lymphoma, unspecified site: Secondary | ICD-10-CM | POA: Diagnosis not present

## 2023-07-27 DIAGNOSIS — C8338 Diffuse large B-cell lymphoma, lymph nodes of multiple sites: Secondary | ICD-10-CM | POA: Diagnosis not present

## 2023-07-27 DIAGNOSIS — Z5111 Encounter for antineoplastic chemotherapy: Secondary | ICD-10-CM | POA: Diagnosis not present

## 2023-08-01 ENCOUNTER — Encounter: Payer: Self-pay | Admitting: Internal Medicine

## 2023-08-10 DIAGNOSIS — I872 Venous insufficiency (chronic) (peripheral): Secondary | ICD-10-CM | POA: Diagnosis not present

## 2023-08-10 DIAGNOSIS — I739 Peripheral vascular disease, unspecified: Secondary | ICD-10-CM | POA: Diagnosis not present

## 2023-08-10 DIAGNOSIS — B351 Tinea unguium: Secondary | ICD-10-CM | POA: Diagnosis not present

## 2023-08-10 DIAGNOSIS — L97511 Non-pressure chronic ulcer of other part of right foot limited to breakdown of skin: Secondary | ICD-10-CM | POA: Diagnosis not present

## 2023-08-10 DIAGNOSIS — L97521 Non-pressure chronic ulcer of other part of left foot limited to breakdown of skin: Secondary | ICD-10-CM | POA: Diagnosis not present

## 2023-08-10 DIAGNOSIS — L84 Corns and callosities: Secondary | ICD-10-CM | POA: Diagnosis not present

## 2023-08-13 ENCOUNTER — Encounter (HOSPITAL_BASED_OUTPATIENT_CLINIC_OR_DEPARTMENT_OTHER): Payer: Self-pay

## 2023-08-13 ENCOUNTER — Emergency Department (HOSPITAL_BASED_OUTPATIENT_CLINIC_OR_DEPARTMENT_OTHER)
Admission: EM | Admit: 2023-08-13 | Discharge: 2023-08-13 | Disposition: A | Discharge status: Home / Self Care | Payer: Medicare PPO | Attending: Emergency Medicine | Admitting: Emergency Medicine

## 2023-08-13 ENCOUNTER — Emergency Department (HOSPITAL_BASED_OUTPATIENT_CLINIC_OR_DEPARTMENT_OTHER): Payer: Medicare PPO

## 2023-08-13 ENCOUNTER — Other Ambulatory Visit (HOSPITAL_BASED_OUTPATIENT_CLINIC_OR_DEPARTMENT_OTHER): Payer: Self-pay | Admitting: Family

## 2023-08-13 ENCOUNTER — Other Ambulatory Visit: Payer: Self-pay | Admitting: Urology

## 2023-08-13 DIAGNOSIS — Z79899 Other long term (current) drug therapy: Secondary | ICD-10-CM | POA: Diagnosis not present

## 2023-08-13 DIAGNOSIS — S0990XA Unspecified injury of head, initial encounter: Secondary | ICD-10-CM | POA: Insufficient documentation

## 2023-08-13 DIAGNOSIS — Z8572 Personal history of non-Hodgkin lymphomas: Secondary | ICD-10-CM | POA: Diagnosis not present

## 2023-08-13 DIAGNOSIS — N390 Urinary tract infection, site not specified: Secondary | ICD-10-CM | POA: Diagnosis not present

## 2023-08-13 DIAGNOSIS — S61412A Laceration without foreign body of left hand, initial encounter: Secondary | ICD-10-CM | POA: Diagnosis not present

## 2023-08-13 DIAGNOSIS — Z7901 Long term (current) use of anticoagulants: Secondary | ICD-10-CM | POA: Insufficient documentation

## 2023-08-13 DIAGNOSIS — Z20822 Contact with and (suspected) exposure to covid-19: Secondary | ICD-10-CM | POA: Diagnosis not present

## 2023-08-13 DIAGNOSIS — J9811 Atelectasis: Secondary | ICD-10-CM | POA: Diagnosis not present

## 2023-08-13 DIAGNOSIS — Z043 Encounter for examination and observation following other accident: Secondary | ICD-10-CM | POA: Diagnosis not present

## 2023-08-13 DIAGNOSIS — R000 Tachycardia, unspecified: Secondary | ICD-10-CM | POA: Diagnosis not present

## 2023-08-13 DIAGNOSIS — R102 Pelvic and perineal pain: Secondary | ICD-10-CM | POA: Diagnosis not present

## 2023-08-13 DIAGNOSIS — W19XXXA Unspecified fall, initial encounter: Secondary | ICD-10-CM | POA: Insufficient documentation

## 2023-08-13 DIAGNOSIS — I700 Atherosclerosis of aorta: Secondary | ICD-10-CM | POA: Diagnosis not present

## 2023-08-13 DIAGNOSIS — I2510 Atherosclerotic heart disease of native coronary artery without angina pectoris: Secondary | ICD-10-CM

## 2023-08-13 DIAGNOSIS — I517 Cardiomegaly: Secondary | ICD-10-CM | POA: Diagnosis not present

## 2023-08-13 DIAGNOSIS — B9689 Other specified bacterial agents as the cause of diseases classified elsewhere: Secondary | ICD-10-CM | POA: Insufficient documentation

## 2023-08-13 DIAGNOSIS — E039 Hypothyroidism, unspecified: Secondary | ICD-10-CM | POA: Diagnosis not present

## 2023-08-13 DIAGNOSIS — S6992XA Unspecified injury of left wrist, hand and finger(s), initial encounter: Secondary | ICD-10-CM | POA: Diagnosis present

## 2023-08-13 DIAGNOSIS — I10 Essential (primary) hypertension: Secondary | ICD-10-CM | POA: Diagnosis not present

## 2023-08-13 DIAGNOSIS — Z96641 Presence of right artificial hip joint: Secondary | ICD-10-CM | POA: Diagnosis not present

## 2023-08-13 DIAGNOSIS — E785 Hyperlipidemia, unspecified: Secondary | ICD-10-CM

## 2023-08-13 DIAGNOSIS — Z23 Encounter for immunization: Secondary | ICD-10-CM | POA: Insufficient documentation

## 2023-08-13 DIAGNOSIS — R319 Hematuria, unspecified: Secondary | ICD-10-CM | POA: Diagnosis not present

## 2023-08-13 DIAGNOSIS — M85842 Other specified disorders of bone density and structure, left hand: Secondary | ICD-10-CM | POA: Diagnosis not present

## 2023-08-13 DIAGNOSIS — M1812 Unilateral primary osteoarthritis of first carpometacarpal joint, left hand: Secondary | ICD-10-CM | POA: Diagnosis not present

## 2023-08-13 DIAGNOSIS — M8588 Other specified disorders of bone density and structure, other site: Secondary | ICD-10-CM | POA: Diagnosis not present

## 2023-08-13 LAB — CBC WITH DIFFERENTIAL/PLATELET
Abs Immature Granulocytes: 0.04 10*3/uL (ref 0.00–0.07)
Basophils Absolute: 0 10*3/uL (ref 0.0–0.1)
Basophils Relative: 1 %
Eosinophils Absolute: 0.2 10*3/uL (ref 0.0–0.5)
Eosinophils Relative: 3 %
HCT: 46.7 % (ref 39.0–52.0)
Hemoglobin: 15.6 g/dL (ref 13.0–17.0)
Immature Granulocytes: 1 %
Lymphocytes Relative: 17 %
Lymphs Abs: 1 10*3/uL (ref 0.7–4.0)
MCH: 32.4 pg (ref 26.0–34.0)
MCHC: 33.4 g/dL (ref 30.0–36.0)
MCV: 96.9 fL (ref 80.0–100.0)
Monocytes Absolute: 0.5 10*3/uL (ref 0.1–1.0)
Monocytes Relative: 8 %
Neutro Abs: 4.3 10*3/uL (ref 1.7–7.7)
Neutrophils Relative %: 70 %
Platelets: 141 10*3/uL — ABNORMAL LOW (ref 150–400)
RBC: 4.82 MIL/uL (ref 4.22–5.81)
RDW: 15.9 % — ABNORMAL HIGH (ref 11.5–15.5)
WBC: 6 10*3/uL (ref 4.0–10.5)
nRBC: 0 % (ref 0.0–0.2)

## 2023-08-13 LAB — RESP PANEL BY RT-PCR (RSV, FLU A&B, COVID)  RVPGX2
Influenza A by PCR: NEGATIVE
Influenza B by PCR: NEGATIVE
Resp Syncytial Virus by PCR: NEGATIVE
SARS Coronavirus 2 by RT PCR: NEGATIVE

## 2023-08-13 LAB — COMPREHENSIVE METABOLIC PANEL
ALT: 22 U/L (ref 0–44)
AST: 29 U/L (ref 15–41)
Albumin: 3.9 g/dL (ref 3.5–5.0)
Alkaline Phosphatase: 50 U/L (ref 38–126)
Anion gap: 8 (ref 5–15)
BUN: 36 mg/dL — ABNORMAL HIGH (ref 8–23)
CO2: 25 mmol/L (ref 22–32)
Calcium: 9.3 mg/dL (ref 8.9–10.3)
Chloride: 102 mmol/L (ref 98–111)
Creatinine, Ser: 1.85 mg/dL — ABNORMAL HIGH (ref 0.61–1.24)
GFR, Estimated: 37 mL/min — ABNORMAL LOW (ref >60)
Glucose, Bld: 86 mg/dL (ref 70–99)
Potassium: 4.7 mmol/L (ref 3.5–5.1)
Sodium: 135 mmol/L (ref 135–145)
Total Bilirubin: 0.7 mg/dL (ref 0.3–1.2)
Total Protein: 5.7 g/dL — ABNORMAL LOW (ref 6.5–8.1)

## 2023-08-13 LAB — URINALYSIS, ROUTINE W REFLEX MICROSCOPIC
Bacteria, UA: NONE SEEN
Bilirubin Urine: NEGATIVE
Glucose, UA: NEGATIVE mg/dL
Ketones, ur: NEGATIVE mg/dL
Nitrite: NEGATIVE
Protein, ur: NEGATIVE mg/dL
Specific Gravity, Urine: 1.017 (ref 1.005–1.030)
pH: 7 (ref 5.0–8.0)

## 2023-08-13 LAB — PROTIME-INR
INR: 1 (ref 0.8–1.2)
Prothrombin Time: 13.3 s (ref 11.4–15.2)

## 2023-08-13 LAB — APTT: aPTT: 32 s (ref 24–36)

## 2023-08-13 LAB — LACTIC ACID, PLASMA: Lactic Acid, Venous: 1.1 mmol/L (ref 0.5–1.9)

## 2023-08-13 MED ORDER — IOHEXOL 300 MG/ML  SOLN
80.0000 mL | Freq: Once | INTRAMUSCULAR | Status: AC | PRN
  Administered 2023-08-13: 80 mL via INTRAVENOUS

## 2023-08-13 MED ORDER — SODIUM CHLORIDE 0.9 % IV SOLN
2.0000 g | INTRAVENOUS | Status: DC
  Administered 2023-08-13: 2 g via INTRAVENOUS
  Filled 2023-08-13: qty 20

## 2023-08-13 MED ORDER — CEPHALEXIN 500 MG PO CAPS: 500.0000 mg | ORAL_CAPSULE | Freq: Two times a day (BID) | ORAL | 0 refills | Status: AC

## 2023-08-13 MED ORDER — TETANUS-DIPHTH-ACELL PERTUSSIS 5-2.5-18.5 LF-MCG/0.5 IM SUSY
0.5000 mL | PREFILLED_SYRINGE | Freq: Once | INTRAMUSCULAR | Status: AC
  Administered 2023-08-13: 0.5 mL via INTRAMUSCULAR
  Filled 2023-08-13: qty 0.5

## 2023-08-13 MED ORDER — LACTATED RINGERS IV BOLUS
500.0000 mL | Freq: Once | INTRAVENOUS | Status: AC
  Administered 2023-08-13: 500 mL via INTRAVENOUS

## 2023-08-13 NOTE — ED Notes (Signed)
Blood cultures obtained at 1005 before starting antibiotics.

## 2023-08-13 NOTE — ED Notes (Signed)
Patient transported to CT 

## 2023-08-13 NOTE — ED Provider Notes (Addendum)
[place] EMERGENCY DEPARTMENT AT [facility] Provider Note   CSN: [id number] Arrival date & time: 08/13/23  0857     History  Chief Complaint  Patient presents with   UTI    [name] is a 77 y.o. male history of large B-cell lymphoma currently on Epcoritamab, A-fib on Xarelto, hypertension, hyper cholesteremia, hypothyroidism, kidney stones, urosepsis presented for suspected UTI.  Patient states over the past week he is felt on and off dysuria and states this feels very similar to previous urinary tract infections.  Patient does endorse some hematuria as well.  Patient did take at home UA that was positive came here to be treated.  Patient denies any fevers, chest pain, shortness of breath, back pain, flank pain, change sensation/motor skills.  Patient does note that last night he had a mechanical fall in which he tripped and landed on his butt and his left hand did scrape cabinet and he does have skin tears there but is full sensation and motor skills in his left hand and states he bandaged them up without issue.  Patient denies hitting his head and denies any head pain, neck pain, vision change, new onset weakness, abdominal pain, nausea/vomiting, recent illnesses, back pain.  Home Medications Prior to Admission medications   Medication Sig Start Date End Date Taking? Authorizing Provider  cephALEXin (KEFLEX) 500 MG capsule Take 1 capsule (500 mg total) by mouth 2 (two) times daily for 5 days. 08/13/23 08/18/23 Yes Willett Lefeber, Beverly Gust, PA-C  acetaminophen (TYLENOL) 325 MG tablet Take 2 tablets (650 mg total) by mouth every 6 (six) hours as needed for mild pain (or Fever >/= 101). 03/25/22   Marguerita Merles Latif, DO  acyclovir (ZOVIRAX) 400 MG tablet Take 400 mg by mouth 2 (two) times daily. 08/11/22   [provider]  albuterol (VENTOLIN HFA) 108 (90 Base) MCG/ACT inhaler Inhale 2 puffs into the lungs every 6 (six) hours as needed for wheezing or shortness of breath.     [provider]  alfuzosin (UROXATRAL) 10 MG 24 hr tablet TAKE 1 TABLET AT BEDTIME 08/10/22   McKenzie, Mardene Celeste, MD  allopurinol (ZYLOPRIM) 100 MG tablet Take 1 tablet (100 mg total) by mouth daily. 05/30/23   Plotnikov, Georgina Quint, MD  amoxicillin (AMOXIL) 500 MG capsule TAKE 4 CAPSULES BY MOUTH ONE HOUR PRIOR TO DENTAL APPOINTMENT/PROCEDURE. 10/26/22   [provider]  ARNUITY ELLIPTA 100 MCG/ACT AEPB Inhale 1 puff into the lungs daily.  on any day albuterol is required for 30 days 05/25/23   [provider]  atorvastatin (LIPITOR) 80 MG tablet Take 1 tablet (80 mg total) by mouth daily. 07/18/23   Alver Sorrow, NP  buPROPion (WELLBUTRIN XL) 300 MG 24 hr tablet TAKE 1 TABLET EVERY DAY WITH BREAKFAST 05/09/23   Plotnikov, Georgina Quint, MD  Calcium Citrate-Vitamin D (CALCIUM CITRATE + D PO) Take 1 tablet by mouth 2 (two) times daily. 1200mg  of Calcium and 1000 units vitamin D3    [provider]  cefadroxil (DURICEF) 500 MG capsule Take 1 capsule (500 mg total) by mouth 2 (two) times daily. 04/19/23   Peter Garter, PA  Cholecalciferol (VITAMIN D) 50 MCG (2000 UT) tablet Take 4,000 Units by mouth in the morning and at bedtime.    [provider]  clonazePAM (KLONOPIN) 1 MG tablet TAKE 1 TABLET THREE TIMES DAILY AS NEEDED FOR ANXIETY 05/04/23   Plotnikov, Georgina Quint, MD  CRANBERRY PO Take 1 tablet by mouth  in the morning and at bedtime.    [provider]  denosumab (PROLIA) 60 MG/ML SOSY injection Inject 60 mg as directed every 6 (six) months. 12/10/15   [provider]  DULoxetine (CYMBALTA) 60 MG capsule TAKE 1 CAPSULE TWICE DAILY 05/09/23   Plotnikov, Georgina Quint, MD  EPINEPHrine 0.3 mg/0.3 mL IJ SOAJ injection Inject 0.3 mg into the muscle once as needed for anaphylaxis (severe allergic reaction). 07/31/21   [provider]  ezetimibe (ZETIA) 10 MG tablet TAKE 1 TABLET EVERY DAY 05/09/23   Chilton Si, MD  feeding supplement  (ENSURE ENLIVE / ENSURE PLUS) LIQD Take 237 mLs by mouth 2 (two) times daily between meals. Patient taking differently: Take 237 mLs by mouth daily. 03/25/22   Marguerita Merles Latif, DO  ferrous sulfate 325 (65 FE) MG tablet Take 325 mg by mouth 2 (two) times daily with a meal.    [provider]  HYDROcodone-acetaminophen (NORCO/VICODIN) 5-325 MG tablet Take 1 tablet by mouth every 4 (four) hours as needed. 03/17/23   [provider]  lactobacillus acidophilus (BACID) TABS tablet Take 1 tablet by mouth daily.    [provider]  lidocaine-prilocaine (EMLA) cream Apply 1 Application topically as needed. Patient taking differently: Apply 1 Application topically as needed (For port access/chemotherapy). 04/21/22   Johney Maine, MD  loratadine (CLARITIN) 10 MG tablet Take 10 mg by mouth daily.    [provider]  Magnesium 250 MG TABS Take 250 mg by mouth daily.    [provider]  metoprolol tartrate (LOPRESSOR) 25 MG tablet TAKE 1 TABLET TWICE DAILY 07/18/23   Alver Sorrow, NP  Multiple Vitamin (MULTIVITAMIN WITH MINERALS) TABS tablet Take 1 tablet by mouth every morning. Men's One-A-Day 50+    [provider]  MYRBETRIQ 25 MG TB24 tablet TAKE 1 TABLET EVERY DAY Patient taking differently: Take 25 mg by mouth at bedtime. 10/06/21   McKenzie, Mardene Celeste, MD  nitroGLYCERIN (NITROSTAT) 0.4 MG SL tablet Place 0.4 mg under the tongue every 5 (five) minutes as needed for chest pain.    [provider]  omeprazole (PRILOSEC) 40 MG capsule TAKE 1 CAPSULE EVERY DAY BEFORE BREAKFAST 05/09/23   Plotnikov, Georgina Quint, MD  Potassium Citrate 15 MEQ (1620 MG) TBCR Take 1 tablet by mouth in the morning and at bedtime. 06/21/23   McKenzie, Mardene Celeste, MD  Probiotic Product (PROBIOTIC PO) Take 1 capsule by mouth daily with breakfast.    [provider]  Risankizumab-rzaa [facility] ) Inject into the skin. On hold    [provider]   solifenacin (VESICARE) 10 MG tablet Take 10 mg by mouth daily.    [provider]  Specialty Vitamins Products (MG PLUS PROTEIN) 133 MG TABS Take 1 tablet by mouth 3 (three) times daily. 11/22/22   [provider]  tamsulosin (FLOMAX) 0.4 MG CAPS capsule Take 1 capsule by mouth daily.    [provider]  testosterone enanthate (DELATESTRYL) 200 MG/ML injection INJECT (200MG ) INTO THE MUSCLE EVERY 14 DAYS. DISCARD VIAL AFTER 28 DAYS. Patient taking differently: Inject 200 mg into the muscle every 14 (fourteen) days. Every other Friday 09/22/21   Plotnikov, Georgina Quint, MD  vitamin C (ASCORBIC ACID) 500 MG tablet Take 500 mg by mouth daily with lunch.    [provider]      Allergies    Short ragweed pollen ext    Review of Systems   Review of Systems  Physical  Exam Updated Vital Signs BP 127/76 (BP Location: Right Arm)   Pulse (!) 117   Temp 97.9 F (36.6 C) (Oral)   Resp 18   Ht 6\' 3"  (1.905 m)   Wt 108.9 kg   SpO2 99%   BMI 30.00 kg/m  Physical Exam Vitals reviewed.  Constitutional:      General: He is not in acute distress. HENT:     Head: Normocephalic and atraumatic.  Eyes:     Extraocular Movements: Extraocular movements intact.     Conjunctiva/sclera: Conjunctivae normal.     Pupils: Pupils are equal, round, and reactive to light.  Cardiovascular:     Rate and Rhythm: Tachycardia present. Rhythm irregular.     Pulses: Normal pulses.     Heart sounds: Normal heart sounds.     Comments: 2+ bilateral radial/dorsalis pedis pulses with increased rate Pulmonary:     Effort: Pulmonary effort is normal. No respiratory distress.     Breath sounds: Normal breath sounds.  Abdominal:     Palpations: Abdomen is soft.     Tenderness: There is no abdominal tenderness. There is no right CVA tenderness, left CVA tenderness, guarding or rebound.  Musculoskeletal:        General: Normal range of motion.     Cervical back: Normal range of  motion and neck supple.     Comments: 5 out of 5 bilateral grip/leg extension strength No midline tenderness or abnormalities noted  Skin:    General: Skin is warm and dry.     Capillary Refill: Capillary refill takes less than 2 seconds.     Comments: 4 small skin tears noted to the left hand on the dorsal aspect there is superficial in nature and not actively hemorrhaging, no bony protrusion or tendons noted  Neurological:     General: No focal deficit present.     Mental Status: He is alert and oriented to person, place, and time.     Sensory: Sensation is intact.     Motor: Motor function is intact.     Coordination: Coordination is intact.     Comments: Sensation intact in all 4 limbs Ambulates with walker at baseline  Psychiatric:        Mood and Affect: Mood normal.     ED Results / Procedures / Treatments   Labs (all labs ordered are listed, but only abnormal results are displayed) Labs Reviewed  URINALYSIS, ROUTINE W REFLEX MICROSCOPIC - Abnormal; Notable for the following components:      Result Value   Hgb urine dipstick MODERATE (*)    Leukocytes,Ua LARGE (*)    All other components within normal limits  COMPREHENSIVE METABOLIC PANEL - Abnormal; Notable for the following components:   BUN 36 (*)    Creatinine, Ser 1.85 (*)    Total Protein 5.7 (*)    GFR, Estimated 37 (*)    All other components within normal limits  CBC WITH DIFFERENTIAL/PLATELET - Abnormal; Notable for the following components:   RDW 15.9 (*)    Platelets 141 (*)    All other components within normal limits  RESP PANEL BY RT-PCR (RSV, FLU A&B, COVID)  RVPGX2  CULTURE, BLOOD (ROUTINE X 2)  CULTURE, BLOOD (ROUTINE X 2)  URINE CULTURE  LACTIC ACID, PLASMA  PROTIME-INR  APTT    EKG EKG Interpretation Date/Time:  Saturday August 13 2023 09:48:45 EDT Ventricular Rate:  86 PR Interval:    QRS Duration:  111 QT Interval:  373 QTC  Calculation: 447 R Axis:   -75  Text  Interpretation: Atrial fibrillation Left anterior fascicular block Anterior infarct, old Confirmed by Ernie Avena ([id number]) on 08/13/2023 10:18:47 AM  Radiology CT ABDOMEN PELVIS W CONTRAST  Result Date: 08/13/2023 CLINICAL DATA:  Urosepsis. EXAM: CT ABDOMEN AND PELVIS WITH CONTRAST TECHNIQUE: Multidetector CT imaging of the abdomen and pelvis was performed using the standard protocol following bolus administration of intravenous contrast. RADIATION DOSE REDUCTION: This exam was performed according to the departmental dose-optimization program which includes automated exposure control, adjustment of the mA and/or kV according to patient size and/or use of iterative reconstruction technique. CONTRAST:  80mL OMNIPAQUE IOHEXOL 300 MG/ML  SOLN COMPARISON:  Noncontrast CT 05/10/2023. FINDINGS: Lower chest: Slight linear opacity lung bases likely scar or atelectasis. No pleural effusion. Some breathing motion. The heart is slightly enlarged. Coronary artery calcifications are seen. Small pericardial effusion. Hepatobiliary: No space-occupying liver lesion. Patent portal vein. Gallbladder is distended. Pancreas: Moderate atrophy of the pancreas. Spleen: Normal in size without focal abnormality. Adrenals/Urinary Tract: Stable bilateral adrenal nodules. Previously felt to be adenomas. Unchanged from previous. Left kidney has some mild atrophy. There are areas of linear perinephric thickening and stranding. Please correlate with the history of previous percutaneous nephrostomy is. There is punctate nonobstructing anterior mid left renal stone. No collecting system dilatation. The left ureter has a normal course and caliber extending down to the bladder. Tiny left-sided Bosniak 1 and 2 renal cysts on the left. The largest is seen posterior in the midportion measuring 18 mm with Hounsfield unit of 10. The right kidney is also with moderate atrophy. There are multiple nonobstructing stones towards the mid and lower aspect.  Largest measures 7 mm. Slight ectasia of the intrarenal collecting system, unchanged from previous. The right ureters diffusely slightly ectatic but no clear ureteral stone. Previously there was some high density within the collecting system itself which is not as well appreciated today. Preserved contours of the urinary bladder. Slight wall thickening and trabeculation. Stomach/Bowel: On this non oral contrast exam, large bowel has a normal course and caliber with diffuse colonic stool. There is underdistended stomach with the gastric wall slightly more thick than usually seen. Please correlate with particular symptoms and further evaluation as clinically appropriate. The small bowel is nondilated. Vascular/Lymphatic: Aortic atherosclerosis. No enlarged abdominal or pelvic lymph nodes. Reproductive: Prostate is unremarkable. Other: Small umbilical and left inguinal fat containing hernias. Musculoskeletal: Streak artifact related to patient's right hip arthroplasty. Osteopenia with moderate degenerative changes of the spine and pelvis. There is some heterogeneous appearance to the left hemi sacrum. Please correlate for any known history including previous insufficiency injury or other chronic process. Previous presumed old right rib and right transverse process fractures of the lumbar spine. Left-sided non healed rib fractures are also seen. Again severe compression of L1 with kyphosis and canal stenosis. IMPRESSION: Bilateral renal atrophy, right worse than left with nonobstructing stones. Preserved overall relative enhancement. There is mild ectasia of the right renal collecting system but this has unchanged from previous examination and there is no distal ureteral stone. Mild urothelial thickening suggested. Please correlate for known etiology otherwise further workup is recommended to exclude an aggressive process if there is no known history. Trabeculated urinary bladder. Slight wall thickening of the stomach.  Please correlate with particular symptoms. Distended gallbladder. If there is concern of acute gallbladder pathology ultrasound could be considered as clinically appropriate. Extensive chronic, traumatic deformities. Electronically Signed   By: Piedad Climes.D.  On: 08/13/2023 11:38   CT Head Wo Contrast  Result Date: 08/13/2023 CLINICAL DATA:  Pyelonephritis.  Fall. EXAM: CT HEAD WITHOUT CONTRAST TECHNIQUE: Contiguous axial images were obtained from the base of the skull through the vertex without intravenous contrast. RADIATION DOSE REDUCTION: This exam was performed according to the departmental dose-optimization program which includes automated exposure control, adjustment of the mA and/or kV according to patient size and/or use of iterative reconstruction technique. COMPARISON:  11/07/2022 FINDINGS: Brain: No evidence of acute infarction, hemorrhage, hydrocephalus, extra-axial collection or mass lesion/mass effect. Cerebral volume loss that is mild for age, stable. Vascular: No hyperdense vessel or unexpected calcification. Skull: Normal. Negative for fracture or focal lesion. Sinuses/Orbits: No evidence of injury IMPRESSION: No evidence of intracranial injury. Electronically Signed   By: Tiburcio Pea M.D.   On: 08/13/2023 11:29   DG Pelvis Portable  Result Date: 08/13/2023 CLINICAL DATA:  Pain after fall EXAM: PORTABLE PELVIS 2 VIEWS COMPARISON:  X-ray 09/17/2013.  CT abdomen pelvis 05/10/2023. FINDINGS: Right hip arthroplasty identified with screw fixated acetabular cup. The femoral component is incompletely included in the imaging field. There are some proximal cerclage wires. Degenerative changes seen of the sacroiliac joints with some sclerosis and joint space loss. Preserved left hip joint. No acute fracture or dislocation. Scattered presumed vascular calcifications. Overlapping cardiac leads. IMPRESSION: Right hip arthroplasty.  Osteopenia and degenerative changes. Electronically Signed    By: Karen Kays M.D.   On: 08/13/2023 11:23   DG Chest Port 1 View  Result Date: 08/13/2023 CLINICAL DATA:  Sepsis.  History of B-cell lymphoma. EXAM: PORTABLE CHEST 1 VIEW COMPARISON:  Chest x-ray 09/15/2022 and older. FINDINGS: Stable right IJ port with tip along the central SVC. Enlarged cardiopericardial silhouette. Calcified aorta. No pneumothorax or effusion. Basilar atelectasis. Chronic interstitial lung changes with some central vascular prominence. Overlapping cardiac leads. Stable nodular focus in the right lung base. This overlies the anterior aspect of the right fifth rib. IMPRESSION: Chest port. Enlarged heart. Slight basilar atelectasis. Chronic lung changes. Please correlate for known history and prior workup or additional evaluation when appropriate. Electronically Signed   By: Karen Kays M.D.   On: 08/13/2023 11:22   DG Hand Complete Left  Result Date: 08/13/2023 CLINICAL DATA:  Skin tear. EXAM: LEFT HAND - COMPLETE 3 VIEW COMPARISON:  None Available. FINDINGS: Osteopenia. No acute fracture or dislocation. Multifocal degenerative changes seen including along the first carpometacarpal joint, the radiocarpal joint. Mild degenerative changes of the second metacarpal phalangeal joint. There sequela of old trauma with fixation plate and screws along the distal radius and presumed old ulnar styloid fracture. Ring artifact obscures the proximal phalanx of fourth digit. IMPRESSION: Osteopenia with multifocal degenerative changes. Old trauma involving the distal radius and ulna. Electronically Signed   By: Karen Kays M.D.   On: 08/13/2023 11:20    Procedures Procedures    Medications Ordered in ED Medications  cefTRIAXone (ROCEPHIN) 2 g in sodium chloride 0.9 % 100 mL IVPB (0 g Intravenous Stopped 08/13/23 1058)  Tdap (BOOSTRIX) injection 0.5 mL (has no administration in time range)  lactated ringers bolus 500 mL (0 mLs Intravenous Stopped 08/13/23 1058)  iohexol (OMNIPAQUE) 300 MG/ML  solution 80 mL (80 mLs Intravenous Contrast Given 08/13/23 1057)    ED Course/ Medical Decision Making/ A&P                                 Medical  Decision Making Amount and/or Complexity of Data Reviewed Labs: ordered. Radiology: ordered. ECG/medicine tests: ordered.  Risk Prescription drug management.   Marcy Salvo 77 y.o. presented today for sepsis.  Working DDx that I considered at this time includes, but not limited to, sepsis, bacteremia, UTI, pneumonia, meningitis/encephalitis, cellulitis, ACS, myocarditis, acidosis, dehydration, electrolyte abnormalities, urosepsis.  R/o DDx: sepsis, bacteremia, pneumonia, meningitis/encephalitis, cellulitis, ACS, myocarditis, acidosis, dehydration, electrolyte abnormalities, urosepsis: These are considered less likely due to history of present illness, physical exam, labs/imaging findings.  Review of prior external notes: 07/27/2023 office visit  Unique Tests and My Interpretation: CBC: Unremarkable CMP: Unremarkable, at baseline Lactic acid: 1.1 UA: Moderate hemoglobin, large leukocytes, pyuria noted Chest x-ray: No acute findings CT abdomen pelvis with contrast: Enlarged gallbladder, ureteral wall thickening CT head without contrast: No acute findings Left hand x-ray:No acute findings Left pelvic x-ray:No acute findings aPTT: Unremarkable PT/INR: Unremarkable Blood cultures: Pending Respiratory panel: Negative EKG: A-fib 86 bpm, left anterior fascicular block, no ST elevations or depressions noted  Social Determinants of Health: none  Discussion with Independent Historian: None  Discussion of Management of Tests: None  Risk: Medium: Medication prescription  Risk Stratification Score: None  Staffed with [place], MD  Plan: On exam patient was in no acute distress however was tachycardic at 117 and in A-fib.  Patient has a history of A-fib and states he was here for a UTI as he has been having dysuria over the past  week which similar to previous UTIs he has had.  Patient did have a fall last night however this sounds mechanical in nature.  Patient did have small skin tears on his left hand and so will place bacitracin ointment on them and write them up as they are outside the window to be closed but will also obtain imaging of his hand.  Sepsis workup was initiated given patient's tachycardia and suspected UTI as patient may have urosepsis.  Patient did have echo done last year that does show an LVEF of 35 to 40% and so we will start patient on 500 mL of LR and not 30 cc per keg as do not cause him to go into flash pulmonary edema and will give Rocephin as well.  Patient's labs and imaging were all reassuring.  Patient's heart rate did come back down to 94 while in the room on recheck.  Given patient's vitals, physical exam, labs patient does not appear septic.  Patient's imaging does show distended gallbladder and patient did not have any red quadrant tenderness or Murphy sign and so do not believe patient needs ultrasound at this time.  I spoke to the patient about having thickened ureter wall and that he will need to follow-up with his urologist to further evaluate this and patient verbalized understanding of this.  Will give patient Keflex for suspected UTI and have him follow-up with his urologist.  Patient was given strict return precautions.  Patient's last tetanus was in 2014 and so patient's tetanus was also updated today.  Patient stable for discharge.  Patient verbalized understanding acceptance of this plan.  This chart was dictated using voice recognition software.  Despite best efforts to proofread,  errors can occur which can change the documentation meaning.  Final Clinical Impression(s) / ED Diagnoses Final diagnoses:  Recurrent UTI (urinary tract infection)    Rx / DC Orders ED Discharge Orders          Ordered    cephALEXin (KEFLEX) 500 MG capsule  2 times  daily        08/13/23 1154              Remi Deter 08/13/23 1158    Ernie Avena, MD 08/13/23 1159

## 2023-08-13 NOTE — ED Notes (Signed)
Reviewed discharge instructions, medications, and home care with pt. Pt verbalized understanding and had no further questions. Pt exited ED without complications.

## 2023-08-13 NOTE — Sepsis Progress Note (Signed)
Elink following code sepsis °

## 2023-08-13 NOTE — ED Notes (Signed)
ED Provider at bedside. 

## 2023-08-13 NOTE — Discharge Instructions (Addendum)
Please follow-up with your urologist regarding recent symptoms and ER visit.  Today your lab work was reassuring and your urine shows that you may have a UTI and so you are given 1 dose IV antibiotics here in will be prescribed the rest.  The CT scan shows that you do have some ureter wall thickening that you will need to follow-up with with your urologist to further evaluate this.  Your tetanus today was also updated.  If symptoms change or worsen please return to ER.

## 2023-08-13 NOTE — ED Notes (Addendum)
Cleaned and dressed pt's skin tears with bactrim ointment, non-adherent pad, and kerlex gauze. Skin tears on left hand from post fall prior to arrival.

## 2023-08-13 NOTE — Sepsis Progress Note (Signed)
Code Sepsis monitoring discontinued due to discharge.  Elink following code sepsis ended at 1217

## 2023-08-13 NOTE — ED Triage Notes (Signed)
States did home test and is positive for UTI.  States has frequent urinary infections.  Also have blood in urine

## 2023-08-14 LAB — URINE CULTURE: Culture: <10000 — AB

## 2023-08-16 DIAGNOSIS — J3081 Allergic rhinitis due to animal (cat) (dog) hair and dander: Secondary | ICD-10-CM | POA: Diagnosis not present

## 2023-08-16 DIAGNOSIS — J3089 Other allergic rhinitis: Secondary | ICD-10-CM | POA: Diagnosis not present

## 2023-08-16 DIAGNOSIS — J301 Allergic rhinitis due to pollen: Secondary | ICD-10-CM | POA: Diagnosis not present

## 2023-08-18 LAB — CULTURE, BLOOD (ROUTINE X 2)
Culture: NO GROWTH
Culture: NO GROWTH
Special Requests: ADEQUATE
Special Requests: ADEQUATE

## 2023-08-24 DIAGNOSIS — R93422 Abnormal radiologic findings on diagnostic imaging of left kidney: Secondary | ICD-10-CM | POA: Diagnosis not present

## 2023-08-24 DIAGNOSIS — C8330 Diffuse large B-cell lymphoma, unspecified site: Secondary | ICD-10-CM | POA: Diagnosis not present

## 2023-08-24 DIAGNOSIS — Z87891 Personal history of nicotine dependence: Secondary | ICD-10-CM | POA: Diagnosis not present

## 2023-08-24 DIAGNOSIS — K429 Umbilical hernia without obstruction or gangrene: Secondary | ICD-10-CM | POA: Diagnosis not present

## 2023-08-24 DIAGNOSIS — R54 Age-related physical debility: Secondary | ICD-10-CM | POA: Diagnosis not present

## 2023-08-24 DIAGNOSIS — R197 Diarrhea, unspecified: Secondary | ICD-10-CM | POA: Diagnosis not present

## 2023-08-24 DIAGNOSIS — C8338 Diffuse large B-cell lymphoma, lymph nodes of multiple sites: Secondary | ICD-10-CM | POA: Diagnosis not present

## 2023-08-24 DIAGNOSIS — D1724 Benign lipomatous neoplasm of skin and subcutaneous tissue of left leg: Secondary | ICD-10-CM | POA: Diagnosis not present

## 2023-08-24 DIAGNOSIS — M7989 Other specified soft tissue disorders: Secondary | ICD-10-CM | POA: Diagnosis not present

## 2023-08-24 DIAGNOSIS — Z09 Encounter for follow-up examination after completed treatment for conditions other than malignant neoplasm: Secondary | ICD-10-CM | POA: Diagnosis not present

## 2023-08-25 ENCOUNTER — Other Ambulatory Visit: Payer: Self-pay | Admitting: Cardiovascular Disease

## 2023-08-25 ENCOUNTER — Other Ambulatory Visit (HOSPITAL_BASED_OUTPATIENT_CLINIC_OR_DEPARTMENT_OTHER): Payer: Self-pay | Admitting: Family

## 2023-08-25 DIAGNOSIS — I480 Paroxysmal atrial fibrillation: Secondary | ICD-10-CM

## 2023-08-25 NOTE — Telephone Encounter (Signed)
Xarelto 20mg  refill request received. Pt is 77 years old, weight-108.9kg, Crea-1.85 on 08/13/23, last seen by Gillian Shields on 07/18/23, Diagnosis-Afib, CrCl-51.51 mL/min; Dose is appropriate based on dosing criteria.  Per last OV note on 07/18/23 it states: Xarelto has been discontinued due to thrombocytosis, hematuria, fall risk.  Asymptomatic regarding atrial fibrillation.Rate controlled with heart rate routinely less than 100 bpm at home per his report.  Continue diltiazem 240 mg daily, metoprolol titrate 25 mg twice daily.   Previous discussion regarding risk/benefit of OAC given recurrent hematuria, thrombocytosis (05/2023 Hb 13.9, Hct 41.8, Plt 145), fall risk. CHA2DS2-VASc Score = 4 [CHF History: 1, HTN History: 1, Diabetes History: 0, Stroke History: 0, Vascular Disease History: 0, Age Score: 2, Gender Score: 0].  Therefore, the patient's annual risk of stroke is 4.8 %.

## 2023-08-25 NOTE — Telephone Encounter (Signed)
Xarelto refill

## 2023-09-05 IMAGING — DX DG CHEST 2V
2 series · 2 of 2 positions shown · non-contrast
Comparison: 05/19/2021

CLINICAL DATA: COVID.  Cough.

EXAM:
CHEST - 2 VIEW

[chest pa]
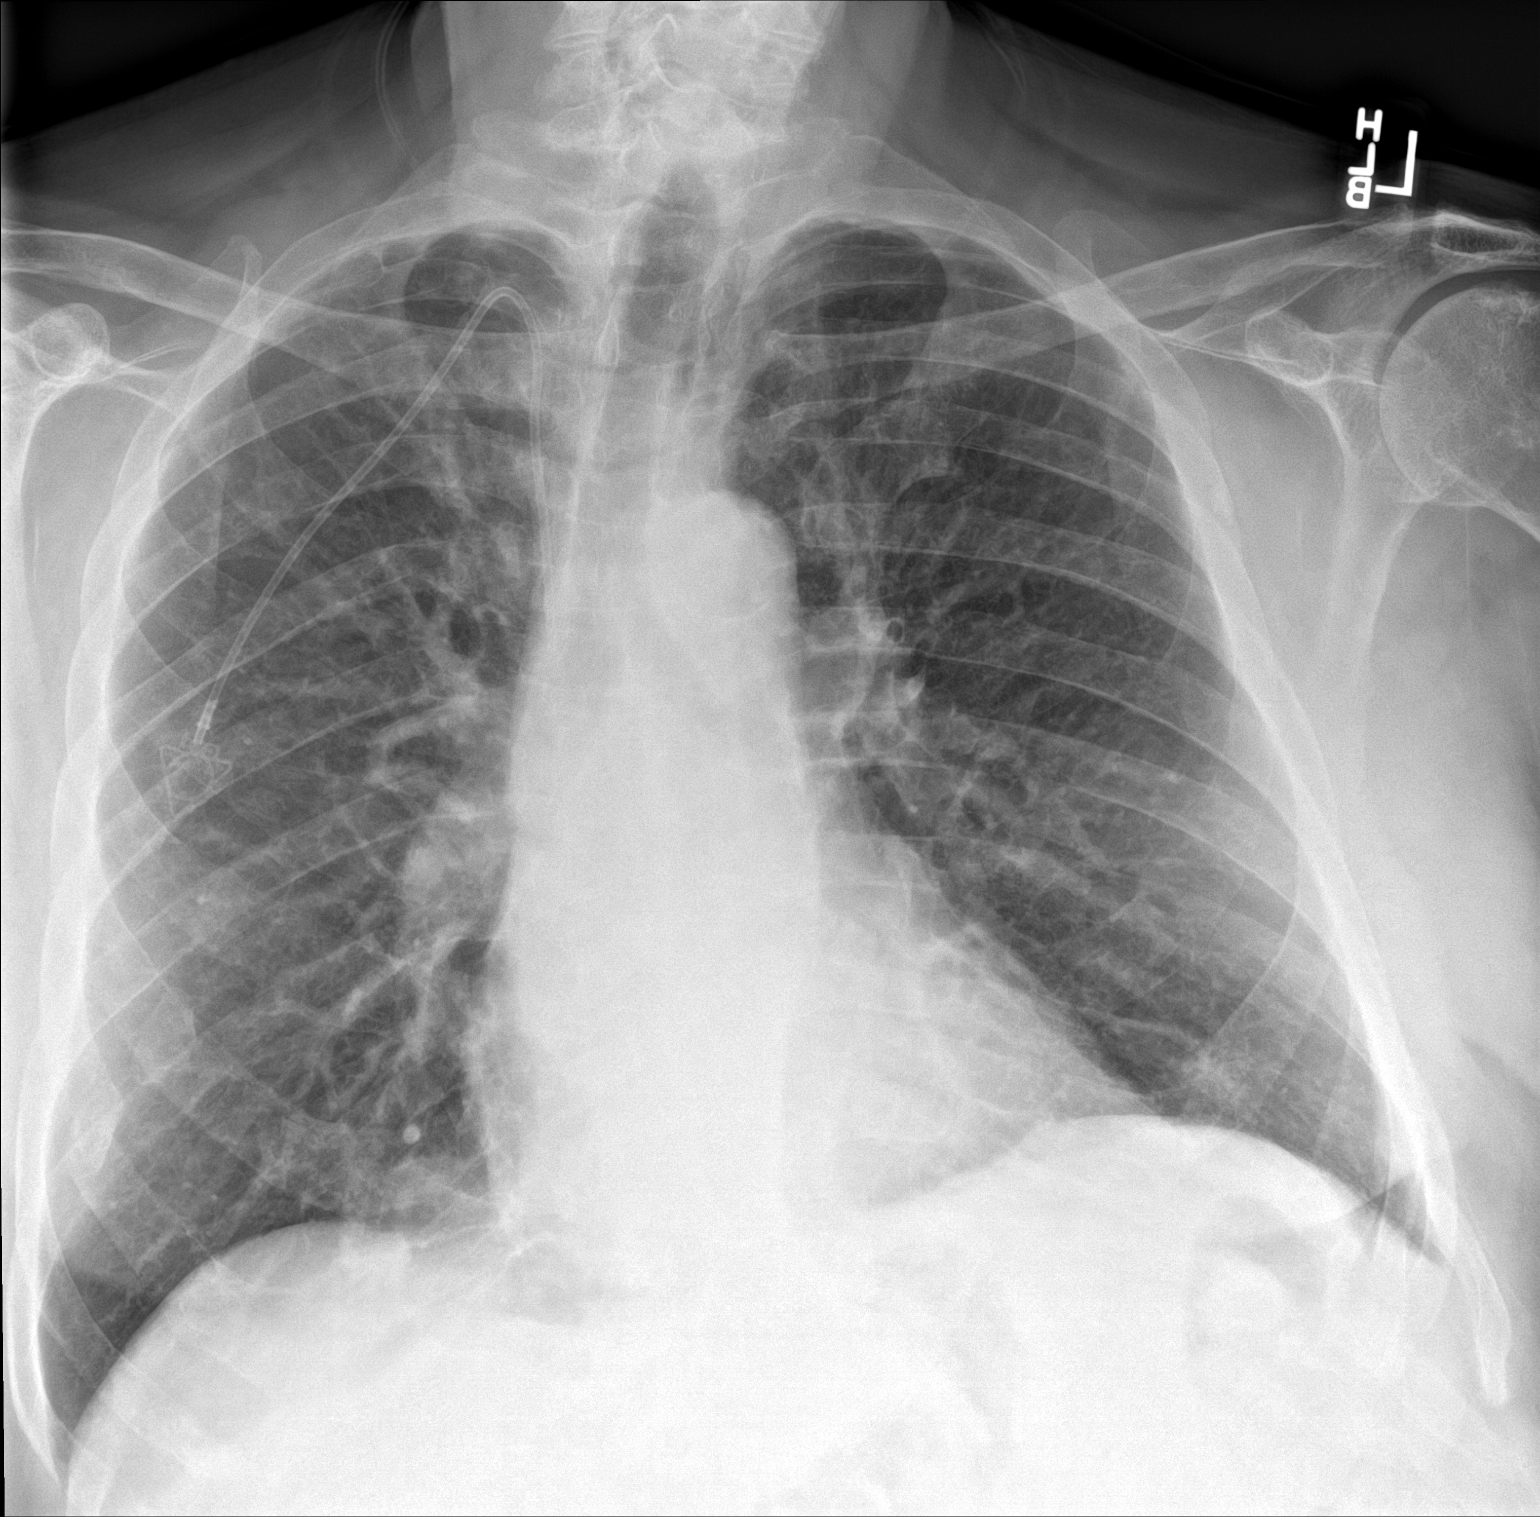

[chest lat]
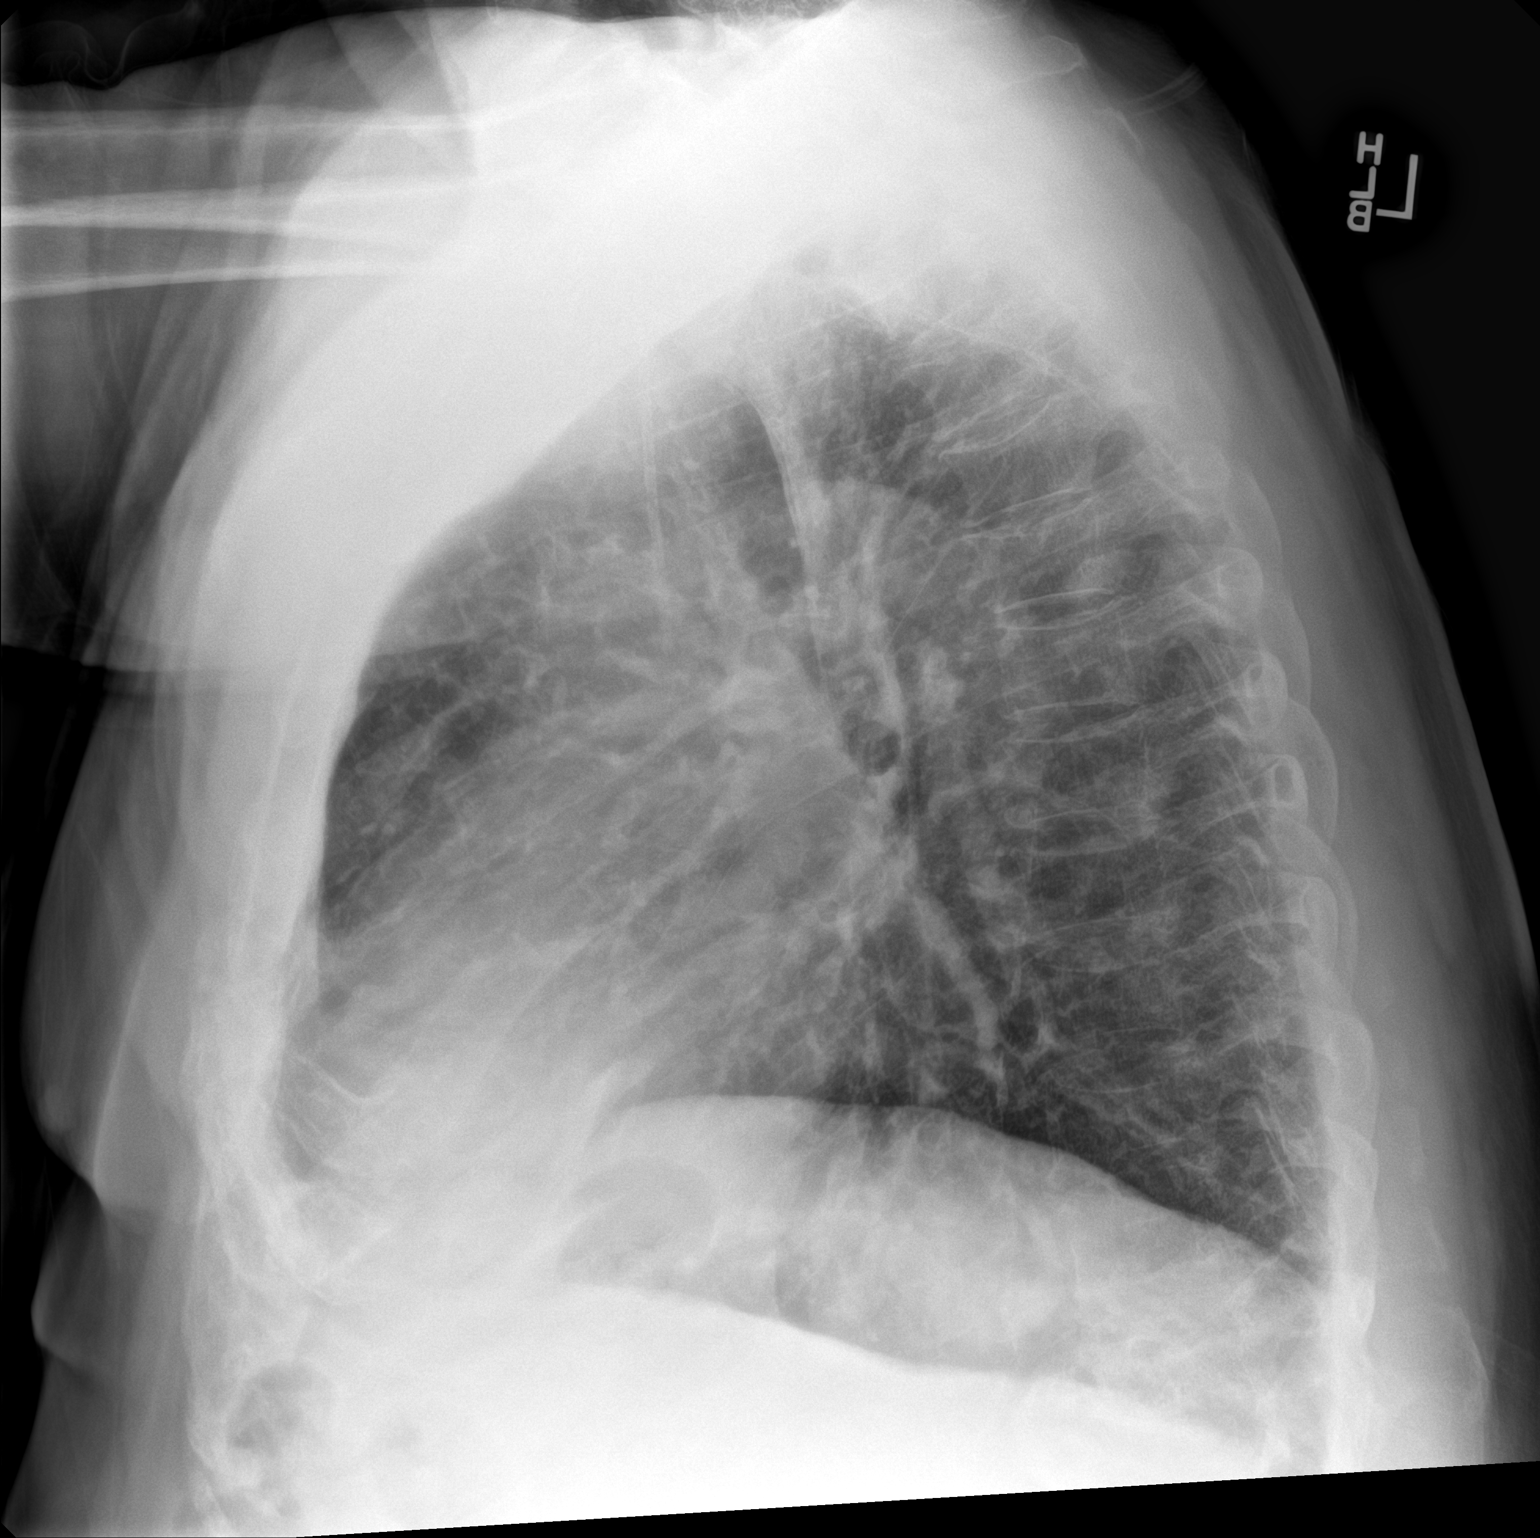

[2 of 2 positions shown; findings below may reference images not displayed]

FINDINGS: Right chest port in place. The heart is normal in size with normal
mediastinal contours, previous cardiomegaly has improved. Aortic
atherosclerosis. Emphysema with small blebs at the right lung apex.
Minimal ill-defined opacity at the left lung base. No confluent
consolidation. No pleural effusion or pneumothorax. No pulmonary
edema. No findings of pulmonary edema. Mild anterior wedging of
upper thoracic vertebra, chronic.
IMPRESSION: 1. Minimal ill-defined opacity at the left lung base may be
atelectasis or pneumonia.
2. Emphysema.

## 2023-09-06 ENCOUNTER — Telehealth: Payer: Self-pay | Admitting: Cardiovascular Disease

## 2023-09-06 ENCOUNTER — Other Ambulatory Visit: Payer: Self-pay

## 2023-09-06 NOTE — Telephone Encounter (Signed)
*  STAT* If patient is at the pharmacy, call can be transferred to refill team.   1. Which medications need to be refilled? (please list name of each medication and dose if known) acyclovir (ZOVIRAX) 400 MG tablet    2. Would you like to learn more about the convenience, safety, & potential cost savings by using the [facility]? No    3. Are you open to using the [facility] No   4. Which pharmacy/location (including street and city if local pharmacy) is medication to be sent to?[facility] - [place], [place] - [address]    5. Do they need a 30 day or 90 day supply? 90 Day Supply

## 2023-09-07 ENCOUNTER — Other Ambulatory Visit: Payer: Self-pay

## 2023-09-07 MED ORDER — ACYCLOVIR 400 MG PO TABS: 400.0000 mg | ORAL_TABLET | Freq: Two times a day (BID) | ORAL | 3 refills | Status: DC

## 2023-09-07 NOTE — Telephone Encounter (Signed)
RX sent to requested Pharmacy

## 2023-09-13 DIAGNOSIS — J3089 Other allergic rhinitis: Secondary | ICD-10-CM | POA: Diagnosis not present

## 2023-09-13 DIAGNOSIS — J301 Allergic rhinitis due to pollen: Secondary | ICD-10-CM | POA: Diagnosis not present

## 2023-09-13 DIAGNOSIS — J3081 Allergic rhinitis due to animal (cat) (dog) hair and dander: Secondary | ICD-10-CM | POA: Diagnosis not present

## 2023-09-13 DIAGNOSIS — F331 Major depressive disorder, recurrent, moderate: Secondary | ICD-10-CM | POA: Diagnosis not present

## 2023-09-13 DIAGNOSIS — F410 Panic disorder [episodic paroxysmal anxiety] without agoraphobia: Secondary | ICD-10-CM | POA: Diagnosis not present

## 2023-09-21 DIAGNOSIS — C8330 Diffuse large B-cell lymphoma, unspecified site: Secondary | ICD-10-CM | POA: Diagnosis not present

## 2023-09-21 DIAGNOSIS — C83398 Diffuse large b-cell lymphoma of other extranodal and solid organ sites: Secondary | ICD-10-CM | POA: Diagnosis not present

## 2023-09-21 DIAGNOSIS — Z5112 Encounter for antineoplastic immunotherapy: Secondary | ICD-10-CM | POA: Diagnosis not present

## 2023-10-07 ENCOUNTER — Other Ambulatory Visit: Payer: Self-pay | Admitting: Internal Medicine

## 2023-10-14 DIAGNOSIS — J3089 Other allergic rhinitis: Secondary | ICD-10-CM | POA: Diagnosis not present

## 2023-10-14 DIAGNOSIS — J3081 Allergic rhinitis due to animal (cat) (dog) hair and dander: Secondary | ICD-10-CM | POA: Diagnosis not present

## 2023-10-14 DIAGNOSIS — J301 Allergic rhinitis due to pollen: Secondary | ICD-10-CM | POA: Diagnosis not present

## 2023-10-19 DIAGNOSIS — C8330 Diffuse large B-cell lymphoma, unspecified site: Secondary | ICD-10-CM | POA: Diagnosis not present

## 2023-10-19 DIAGNOSIS — C83398 Diffuse large b-cell lymphoma of other extranodal and solid organ sites: Secondary | ICD-10-CM | POA: Diagnosis not present

## 2023-10-19 DIAGNOSIS — Z5112 Encounter for antineoplastic immunotherapy: Secondary | ICD-10-CM | POA: Diagnosis not present

## 2023-10-27 ENCOUNTER — Telehealth: Payer: Self-pay | Admitting: Internal Medicine

## 2023-10-27 DIAGNOSIS — R351 Nocturia: Secondary | ICD-10-CM | POA: Diagnosis not present

## 2023-10-27 DIAGNOSIS — N401 Enlarged prostate with lower urinary tract symptoms: Secondary | ICD-10-CM | POA: Diagnosis not present

## 2023-10-27 DIAGNOSIS — N5201 Erectile dysfunction due to arterial insufficiency: Secondary | ICD-10-CM | POA: Diagnosis not present

## 2023-10-27 DIAGNOSIS — N3020 Other chronic cystitis without hematuria: Secondary | ICD-10-CM | POA: Diagnosis not present

## 2023-10-27 NOTE — Telephone Encounter (Signed)
Duplicate encounter

## 2023-10-27 NOTE — Telephone Encounter (Signed)
Copied from CRM [id number]. Topic: Clinical - Medication Refill >> Oct 27, 2023  3:34 PM Truddie Crumble wrote: Most Recent Primary Care Visit:  Provider: Tresa Garter  Department: [facility]  Visit Type: OFFICE VISIT  Date: 05/23/2023  Medication: levothyroxine  Has the patient contacted their pharmacy? No (Agent: If no, request that the patient contact the pharmacy for the refill. If patient does not wish to contact the pharmacy document the reason why and proceed with request.) (Agent: If yes, when and what did the pharmacy advise?)  Is this the correct pharmacy for this prescription? Yes If no, delete pharmacy and type the correct one.  This is the patient's preferred pharmacy:   [facility] - [place], [place] - [address] [place] [address] Phone: [phone] Fax: [phone]  Has the prescription been filled recently? No  Is the patient out of the medication? No  Has the patient been seen for an appointment in the last year OR does the patient have an upcoming appointment? Yes  Can we respond through MyChart? Yes  Agent: Please be advised that Rx refills may take up to 3 business days. We ask that you follow-up with your pharmacy.

## 2023-10-28 ENCOUNTER — Other Ambulatory Visit: Payer: Self-pay

## 2023-10-28 DIAGNOSIS — E039 Hypothyroidism, unspecified: Secondary | ICD-10-CM

## 2023-10-28 MED ORDER — LEVOTHYROXINE SODIUM 125 MCG PO TABS: 125.0000 ug | ORAL_TABLET | Freq: Every day | ORAL | 3 refills | Status: DC

## 2023-11-10 DIAGNOSIS — L97521 Non-pressure chronic ulcer of other part of left foot limited to breakdown of skin: Secondary | ICD-10-CM | POA: Diagnosis not present

## 2023-11-10 DIAGNOSIS — L84 Corns and callosities: Secondary | ICD-10-CM | POA: Diagnosis not present

## 2023-11-10 DIAGNOSIS — L97511 Non-pressure chronic ulcer of other part of right foot limited to breakdown of skin: Secondary | ICD-10-CM | POA: Diagnosis not present

## 2023-11-10 DIAGNOSIS — B351 Tinea unguium: Secondary | ICD-10-CM | POA: Diagnosis not present

## 2023-11-10 DIAGNOSIS — I872 Venous insufficiency (chronic) (peripheral): Secondary | ICD-10-CM | POA: Diagnosis not present

## 2023-11-11 DIAGNOSIS — D225 Melanocytic nevi of trunk: Secondary | ICD-10-CM | POA: Diagnosis not present

## 2023-11-11 DIAGNOSIS — L814 Other melanin hyperpigmentation: Secondary | ICD-10-CM | POA: Diagnosis not present

## 2023-11-11 DIAGNOSIS — L821 Other seborrheic keratosis: Secondary | ICD-10-CM | POA: Diagnosis not present

## 2023-11-11 DIAGNOSIS — L720 Epidermal cyst: Secondary | ICD-10-CM | POA: Diagnosis not present

## 2023-11-11 DIAGNOSIS — D485 Neoplasm of uncertain behavior of skin: Secondary | ICD-10-CM | POA: Diagnosis not present

## 2023-11-11 DIAGNOSIS — Z85828 Personal history of other malignant neoplasm of skin: Secondary | ICD-10-CM | POA: Diagnosis not present

## 2023-11-11 DIAGNOSIS — L400 Psoriasis vulgaris: Secondary | ICD-10-CM | POA: Diagnosis not present

## 2023-11-14 DIAGNOSIS — J301 Allergic rhinitis due to pollen: Secondary | ICD-10-CM | POA: Diagnosis not present

## 2023-11-14 DIAGNOSIS — J3081 Allergic rhinitis due to animal (cat) (dog) hair and dander: Secondary | ICD-10-CM | POA: Diagnosis not present

## 2023-11-14 DIAGNOSIS — F410 Panic disorder [episodic paroxysmal anxiety] without agoraphobia: Secondary | ICD-10-CM | POA: Diagnosis not present

## 2023-11-14 DIAGNOSIS — F331 Major depressive disorder, recurrent, moderate: Secondary | ICD-10-CM | POA: Diagnosis not present

## 2023-11-14 DIAGNOSIS — J3089 Other allergic rhinitis: Secondary | ICD-10-CM | POA: Diagnosis not present

## 2023-11-16 DIAGNOSIS — C8330 Diffuse large B-cell lymphoma, unspecified site: Secondary | ICD-10-CM | POA: Diagnosis not present

## 2023-11-16 DIAGNOSIS — Z5112 Encounter for antineoplastic immunotherapy: Secondary | ICD-10-CM | POA: Diagnosis not present

## 2023-11-16 DIAGNOSIS — Z9221 Personal history of antineoplastic chemotherapy: Secondary | ICD-10-CM | POA: Diagnosis not present

## 2023-11-17 DIAGNOSIS — B351 Tinea unguium: Secondary | ICD-10-CM | POA: Diagnosis not present

## 2023-11-17 DIAGNOSIS — L97521 Non-pressure chronic ulcer of other part of left foot limited to breakdown of skin: Secondary | ICD-10-CM | POA: Diagnosis not present

## 2023-11-17 DIAGNOSIS — I872 Venous insufficiency (chronic) (peripheral): Secondary | ICD-10-CM | POA: Diagnosis not present

## 2023-11-17 DIAGNOSIS — L97511 Non-pressure chronic ulcer of other part of right foot limited to breakdown of skin: Secondary | ICD-10-CM | POA: Diagnosis not present

## 2023-11-17 DIAGNOSIS — L84 Corns and callosities: Secondary | ICD-10-CM | POA: Diagnosis not present

## 2023-11-21 ENCOUNTER — Other Ambulatory Visit: Payer: Self-pay

## 2023-11-21 ENCOUNTER — Emergency Department (HOSPITAL_BASED_OUTPATIENT_CLINIC_OR_DEPARTMENT_OTHER)
Admission: EM | Admit: 2023-11-21 | Discharge: 2023-11-21 | Disposition: A | Discharge status: Home / Self Care | Payer: Medicare PPO | Attending: Emergency Medicine | Admitting: Emergency Medicine

## 2023-11-21 ENCOUNTER — Encounter (HOSPITAL_BASED_OUTPATIENT_CLINIC_OR_DEPARTMENT_OTHER): Payer: Self-pay | Admitting: Emergency Medicine

## 2023-11-21 DIAGNOSIS — J449 Chronic obstructive pulmonary disease, unspecified: Secondary | ICD-10-CM | POA: Diagnosis not present

## 2023-11-21 DIAGNOSIS — Z7951 Long term (current) use of inhaled steroids: Secondary | ICD-10-CM | POA: Diagnosis not present

## 2023-11-21 DIAGNOSIS — Z79899 Other long term (current) drug therapy: Secondary | ICD-10-CM | POA: Diagnosis not present

## 2023-11-21 DIAGNOSIS — I10 Essential (primary) hypertension: Secondary | ICD-10-CM | POA: Diagnosis not present

## 2023-11-21 DIAGNOSIS — R300 Dysuria: Secondary | ICD-10-CM | POA: Insufficient documentation

## 2023-11-21 DIAGNOSIS — I2510 Atherosclerotic heart disease of native coronary artery without angina pectoris: Secondary | ICD-10-CM | POA: Diagnosis not present

## 2023-11-21 LAB — COMPREHENSIVE METABOLIC PANEL WITH GFR
ALT: 20 U/L (ref 0–44)
AST: 21 U/L (ref 15–41)
Albumin: 3.6 g/dL (ref 3.5–5.0)
Alkaline Phosphatase: 57 U/L (ref 38–126)
Anion gap: 7 (ref 5–15)
BUN: 27 mg/dL — ABNORMAL HIGH (ref 8–23)
CO2: 28 mmol/L (ref 22–32)
Calcium: 8.8 mg/dL — ABNORMAL LOW (ref 8.9–10.3)
Chloride: 102 mmol/L (ref 98–111)
Creatinine, Ser: 1.66 mg/dL — ABNORMAL HIGH (ref 0.61–1.24)
GFR, Estimated: 42 mL/min — ABNORMAL LOW
Glucose, Bld: 95 mg/dL (ref 70–99)
Potassium: 4.7 mmol/L (ref 3.5–5.1)
Sodium: 137 mmol/L (ref 135–145)
Total Bilirubin: 0.9 mg/dL (ref 0.0–1.2)
Total Protein: 6.3 g/dL — ABNORMAL LOW (ref 6.5–8.1)

## 2023-11-21 LAB — URINALYSIS, ROUTINE W REFLEX MICROSCOPIC
Bilirubin Urine: NEGATIVE
Glucose, UA: NEGATIVE mg/dL
Ketones, ur: NEGATIVE mg/dL
Nitrite: NEGATIVE
RBC / HPF: >50 RBC/hpf (ref 0–5)
Specific Gravity, Urine: 1.019 (ref 1.005–1.030)
WBC, UA: >50 WBC/hpf (ref 0–5)
pH: 8 (ref 5.0–8.0)

## 2023-11-21 LAB — CBC
HCT: 47 % (ref 39.0–52.0)
Hemoglobin: 15.1 g/dL (ref 13.0–17.0)
MCH: 33 pg (ref 26.0–34.0)
MCHC: 32.1 g/dL (ref 30.0–36.0)
MCV: 102.6 fL — ABNORMAL HIGH (ref 80.0–100.0)
Platelets: 175 10*3/uL (ref 150–400)
RBC: 4.58 MIL/uL (ref 4.22–5.81)
RDW: 15.4 % (ref 11.5–15.5)
WBC: 6.6 10*3/uL (ref 4.0–10.5)
nRBC: 0 % (ref 0.0–0.2)

## 2023-11-21 MED ORDER — CEPHALEXIN 500 MG PO CAPS: 500.0000 mg | ORAL_CAPSULE | Freq: Four times a day (QID) | ORAL | 0 refills | Status: DC

## 2023-11-21 NOTE — Discharge Instructions (Signed)
 As discussed, urine appeared infected.  Will treat this with antibiotics.  We will culture urine to see what it grows out.  Recommend follow-up with urologist reassessment.  Please do not hesitate to return if the worrisome signs and symptoms we discussed become apparent.

## 2023-11-21 NOTE — ED Provider Notes (Signed)
[facility] EMERGENCY DEPARTMENT AT [facility] Provider Note   CSN: [id number] Arrival date & time: 11/21/23  4098     History  Chief Complaint  Patient presents with   Dysuria    [name] is a 78 y.o. male.   Dysuria Presenting symptoms: dysuria     78 year old male presents emergency department with complaints of dysuria, urinary frequency.  Patient reports symptoms present since Saturday night or Sunday morning.  States he has a history of recurrent UTI.  States that he has been hospitalized in the past due to altered mentation and generalized weakness secondary to UTI.  States that he has been coming to the emergency department prior to symptoms getting that severe.  Denies any fever, abdominal pain, flank pain, noticeable hematuria, change in bowel habits, chest pain, shortness of breath.  States his last treatment was around November with antibiotics for UTI.  Does state that he follows regularly with urology in the outpatient setting every month a month and a half or so.  Past medical history significant for CAD, CHF, B-cell lymphoma, hyperlipidemia, hypertension, OA, persistent atrial fibrillation, COPD, GERD  Home Medications Prior to Admission medications   Medication Sig Start Date End Date Taking? Authorizing Provider  cephALEXin (KEFLEX) 500 MG capsule Take 1 capsule (500 mg total) by mouth 4 (four) times daily. 11/21/23  Yes Sherian Maroon A, PA  acetaminophen (TYLENOL) 325 MG tablet Take 2 tablets (650 mg total) by mouth every 6 (six) hours as needed for mild pain (or Fever >/= 101). 03/25/22   Marguerita Merles Latif, DO  acyclovir (ZOVIRAX) 400 MG tablet Take 1 tablet (400 mg total) by mouth 2 (two) times daily. 09/07/23   Chilton Si, MD  albuterol (VENTOLIN HFA) 108 (90 Base) MCG/ACT inhaler Inhale 2 puffs into the lungs every 6 (six) hours as needed for wheezing or shortness of breath.    [provider]  alfuzosin (UROXATRAL) 10 MG 24 hr  tablet TAKE 1 TABLET AT BEDTIME 08/16/23   McKenzie, Mardene Celeste, MD  allopurinol (ZYLOPRIM) 100 MG tablet Take 1 tablet (100 mg total) by mouth daily. 05/30/23   Plotnikov, Georgina Quint, MD  amoxicillin (AMOXIL) 500 MG capsule TAKE 4 CAPSULES BY MOUTH ONE HOUR PRIOR TO DENTAL APPOINTMENT/PROCEDURE. 10/26/22   [provider]  ARNUITY ELLIPTA 100 MCG/ACT AEPB Inhale 1 puff into the lungs daily.  on any day albuterol is required for 30 days 05/25/23   [provider]  atorvastatin (LIPITOR) 80 MG tablet TAKE 1 TABLET EVERY DAY 08/15/23   Alver Sorrow, NP  buPROPion (WELLBUTRIN XL) 300 MG 24 hr tablet TAKE 1 TABLET EVERY DAY WITH BREAKFAST 05/09/23   Plotnikov, Georgina Quint, MD  Calcium Citrate-Vitamin D (CALCIUM CITRATE + D PO) Take 1 tablet by mouth 2 (two) times daily. 1200mg  of Calcium and 1000 units vitamin D3    [provider]  Cholecalciferol (VITAMIN D) 50 MCG (2000 UT) tablet Take 4,000 Units by mouth in the morning and at bedtime.    [provider]  clonazePAM (KLONOPIN) 1 MG tablet TAKE 1 TABLET THREE TIMES DAILY AS NEEDED FOR ANXIETY 10/11/23   Plotnikov, Georgina Quint, MD  CRANBERRY PO Take 1 tablet by mouth in the morning and at bedtime.    [provider]  denosumab (PROLIA) 60 MG/ML SOSY injection Inject 60 mg as directed every 6 (six) months. 12/10/15   [provider]  DULoxetine (CYMBALTA) 60 MG capsule TAKE 1 CAPSULE TWICE DAILY 05/09/23  Plotnikov, Georgina Quint, MD  EPINEPHrine 0.3 mg/0.3 mL IJ SOAJ injection Inject 0.3 mg into the muscle once as needed for anaphylaxis (severe allergic reaction). 07/31/21   [provider]  ezetimibe (ZETIA) 10 MG tablet TAKE 1 TABLET EVERY DAY 08/25/23   Chilton Si, MD  feeding supplement (ENSURE ENLIVE / ENSURE PLUS) LIQD Take 237 mLs by mouth 2 (two) times daily between meals. Patient taking differently: Take 237 mLs by mouth daily. 03/25/22   Marguerita Merles Latif, DO  ferrous sulfate 325 (65  FE) MG tablet Take 325 mg by mouth 2 (two) times daily with a meal.    [provider]  HYDROcodone-acetaminophen (NORCO/VICODIN) 5-325 MG tablet Take 1 tablet by mouth every 4 (four) hours as needed. 03/17/23   [provider]  lactobacillus acidophilus (BACID) TABS tablet Take 1 tablet by mouth daily.    [provider]  levothyroxine (SYNTHROID) 125 MCG tablet Take 1 tablet (125 mcg total) by mouth daily at 6 (six) AM. 10/28/23   Plotnikov, Georgina Quint, MD  lidocaine-prilocaine (EMLA) cream Apply 1 Application topically as needed. Patient taking differently: Apply 1 Application topically as needed (For port access/chemotherapy). 04/21/22   Johney Maine, MD  loratadine (CLARITIN) 10 MG tablet Take 10 mg by mouth daily.    [provider]  Magnesium 250 MG TABS Take 250 mg by mouth daily.    [provider]  metoprolol tartrate (LOPRESSOR) 25 MG tablet TAKE 1 TABLET TWICE DAILY 07/18/23   Alver Sorrow, NP  Multiple Vitamin (MULTIVITAMIN WITH MINERALS) TABS tablet Take 1 tablet by mouth every morning. Men's One-A-Day 50+    [provider]  MYRBETRIQ 25 MG TB24 tablet TAKE 1 TABLET EVERY DAY Patient taking differently: Take 25 mg by mouth at bedtime. 10/06/21   McKenzie, Mardene Celeste, MD  nitroGLYCERIN (NITROSTAT) 0.4 MG SL tablet Place 0.4 mg under the tongue every 5 (five) minutes as needed for chest pain.    [provider]  omeprazole (PRILOSEC) 40 MG capsule TAKE 1 CAPSULE EVERY DAY BEFORE BREAKFAST 05/09/23   Plotnikov, Georgina Quint, MD  Potassium Citrate 15 MEQ (1620 MG) TBCR Take 1 tablet by mouth in the morning and at bedtime. 06/21/23   McKenzie, Mardene Celeste, MD  Probiotic Product (PROBIOTIC PO) Take 1 capsule by mouth daily with breakfast.    [provider]  Risankizumab-rzaa [facility] [place]) Inject into the skin. On hold    [provider]  solifenacin (VESICARE) 10 MG tablet Take 10 mg by mouth daily.    [provider]  Specialty Vitamins Products (MG PLUS PROTEIN) 133 MG TABS Take 1 tablet by mouth 3 (three) times daily. 11/22/22   [provider]  tamsulosin (FLOMAX) 0.4 MG CAPS capsule Take 1 capsule by mouth daily.    [provider]  testosterone enanthate (DELATESTRYL) 200 MG/ML injection INJECT (200MG ) INTO THE MUSCLE EVERY 14 DAYS. DISCARD VIAL AFTER 28 DAYS. Patient taking differently: Inject 200 mg into the muscle every 14 (fourteen) days. Every other Friday 09/22/21   Plotnikov, Georgina Quint, MD  vitamin C (ASCORBIC ACID) 500 MG tablet Take 500 mg by mouth daily with lunch.    [provider]      Allergies    Short ragweed pollen ext    Review of Systems   Review of Systems  Genitourinary:  Positive for dysuria.  All other systems reviewed and are negative.   Physical Exam Updated Vital Signs BP 125/78  Pulse 91   Temp 98.1 F (36.7 C) (Oral)   Resp (!) 22   SpO2 97%  Physical Exam Vitals and nursing note reviewed.  Constitutional:      General: He is not in acute distress.    Appearance: He is well-developed.  HENT:     Head: Normocephalic and atraumatic.  Eyes:     Conjunctiva/sclera: Conjunctivae normal.  Cardiovascular:     Rate and Rhythm: Normal rate and regular rhythm.  Pulmonary:     Effort: Pulmonary effort is normal. No respiratory distress.     Breath sounds: Normal breath sounds. No wheezing, rhonchi or rales.  Abdominal:     Palpations: Abdomen is soft.     Tenderness: There is no abdominal tenderness. There is no right CVA tenderness, left CVA tenderness or guarding.  Musculoskeletal:        General: No swelling.     Cervical back: Neck supple.  Skin:    General: Skin is warm and dry.     Capillary Refill: Capillary refill takes less than 2 seconds.  Neurological:     Mental Status: He is alert.  Psychiatric:        Mood and Affect: Mood normal.     ED Results / Procedures / Treatments   Labs (all labs  ordered are listed, but only abnormal results are displayed) Labs Reviewed  URINALYSIS, ROUTINE W REFLEX MICROSCOPIC - Abnormal; Notable for the following components:      Result Value   APPearance HAZY (*)    Hgb urine dipstick MODERATE (*)    Protein, ur TRACE (*)    Leukocytes,Ua LARGE (*)    Bacteria, UA FEW (*)    All other components within normal limits  CBC - Abnormal; Notable for the following components:   MCV 102.6 (*)    All other components within normal limits  COMPREHENSIVE METABOLIC PANEL - Abnormal; Notable for the following components:   BUN 27 (*)    Creatinine, Ser 1.66 (*)    Calcium 8.8 (*)    Total Protein 6.3 (*)    GFR, Estimated 42 (*)    All other components within normal limits  URINE CULTURE    EKG None  Radiology No results found.  Procedures Procedures    Medications Ordered in ED Medications - No data to display  ED Course/ Medical Decision Making/ A&P                                 Medical Decision Making Amount and/or Complexity of Data Reviewed Labs: ordered.  Risk Prescription drug management.   This patient presents to the ED for concern of dysuria, urinary frequency, this involves an extensive number of treatment options, and is a complaint that carries with it a high risk of complications and morbidity.  The differential diagnosis includes nephrolithiasis, pyelonephritis, cystitis, hyperglycemia, other   Co morbidities that complicate the patient evaluation  See HPI   Additional history obtained:  Additional history obtained from EMR External records from outside source obtained and reviewed including hospital records   Lab Tests:  I Ordered, and personally interpreted labs.  The pertinent results include: UA with greater than 50 RBCs, greater than 50 WBCs, few bacteria, large leukocytes, trace proteins, moderate hemoglobin, otherwise unremarkable.  No leukocytosis.  Evidence of anemia.  Platelets within range.   No electrolyte abnormalities besides slight hypocalcemia of 8.8.  No transaminitis.  Patient  with baseline renal dysfunction with BUN of 27, creatinine 1.66 and GFR 42.   Imaging Studies ordered:  Patient declined   Cardiac Monitoring: / EKG:  The patient was maintained on a cardiac monitor.  I personally viewed and interpreted the cardiac monitored which showed an underlying rhythm of: Sinus rhythm   Consultations Obtained:  N/a   Problem List / ED Course / Critical interventions / Medication management  Dysuria Reevaluation of the patient showed that the patient stayed the same I have reviewed the patients home medicines and have made adjustments as needed   Social Determinants of Health:  Former cigarette use.  Denies illicit drug use.   Test / Admission - Considered:  Dysuria Vitals signs within normal range and stable throughout visit. Laboratory studies significant for: See above 78 year old male presents emergency department with complaints of urinary frequency and feelings of burning while urinating since Saturday or Sunday.  Patient with history of recurrent UTIs with most recent positive culture growing out widely susceptible E. coli.  On exam, no abdominal or CVA tenderness.  UA concerning for infection with leukocytes, RBCs and WBCs present.  Offered patient CT scan of the abdomen given history of kidney stones in the setting of RBCs greater than 50 on UA despite lack of abdominal/flank pain but patient declined.  States that he feels like the symptoms are  "exactly the same symptoms" from prior UTIs.  I discussed potential risks of missing stone in the setting of presumed UTI and he acknowledged understanding and would prefer to forego imaging at this time in favor of treating empirically for antibiotics.  Will place patient on Keflex given susceptibility in the past and recommend follow-up with urology in the outpatient setting.  Patient\'s urine will be cultured.   Patient without meeting of SIRS criteria so sepsis protocol was not performed.  Treatment plan discussed at length with patient and he acknowledged understanding was agreeable to said plan.  Patient overall well-appearing, afebrile in no acute distress. Worrisome signs and symptoms were discussed with the patient, and the patient acknowledged understanding to return to the ED if noticed. Patient was stable upon discharge.          Final Clinical Impression(s) / ED Diagnoses Final diagnoses:  Dysuria    Rx / DC Orders ED Discharge Orders          Ordered    cephALEXin (KEFLEX) 500 MG capsule  4 times daily        02 /10/25 1124              Peter Garter, [place] 11/21/23 1133    Edwin Dada P, DO 11/26/23 [phone]

## 2023-11-21 NOTE — ED Triage Notes (Signed)
 Dysuria for a few weeks.

## 2023-11-21 NOTE — ED Notes (Signed)
 Lab to add urine culture to previously collected urine

## 2023-11-22 LAB — URINE CULTURE

## 2023-11-23 ENCOUNTER — Ambulatory Visit: Payer: Medicare PPO | Admitting: Internal Medicine

## 2023-11-24 DIAGNOSIS — H90A31 Mixed conductive and sensorineural hearing loss, unilateral, right ear with restricted hearing on the contralateral side: Secondary | ICD-10-CM | POA: Diagnosis not present

## 2023-11-29 ENCOUNTER — Encounter: Payer: Self-pay | Admitting: Internal Medicine

## 2023-11-29 ENCOUNTER — Ambulatory Visit: Payer: Medicare PPO | Admitting: Internal Medicine

## 2023-11-29 VITALS — BP 102/70 | HR 76 | Temp 98.3°F | Ht 75.0 in | Wt 253.0 lb

## 2023-11-29 DIAGNOSIS — C8338 Diffuse large B-cell lymphoma, lymph nodes of multiple sites: Secondary | ICD-10-CM

## 2023-11-29 DIAGNOSIS — M545 Low back pain, unspecified: Secondary | ICD-10-CM | POA: Diagnosis not present

## 2023-11-29 DIAGNOSIS — M79605 Pain in left leg: Secondary | ICD-10-CM | POA: Diagnosis not present

## 2023-11-29 DIAGNOSIS — M79604 Pain in right leg: Secondary | ICD-10-CM

## 2023-11-29 DIAGNOSIS — E039 Hypothyroidism, unspecified: Secondary | ICD-10-CM

## 2023-11-29 NOTE — Progress Notes (Signed)
Subjective:  Patient ID: [name], male    DOB: September 23, 1946  Age: 78 y.o. MRN: [id number]  CC: Medical Management of Chronic Issues (6 mnth f/u, Still having issues with sleeping )   HPI [name] presents for LBP, hypogonadism C/o cough after a [place] trip - better  Outpatient Medications Prior to Visit  Medication Sig Dispense Refill   acetaminophen (TYLENOL) 325 MG tablet Take 2 tablets (650 mg total) by mouth every 6 (six) hours as needed for mild pain (or Fever >/= 101). 20 tablet    acyclovir (ZOVIRAX) 400 MG tablet Take 1 tablet (400 mg total) by mouth 2 (two) times daily. 180 tablet 3   albuterol (VENTOLIN HFA) 108 (90 Base) MCG/ACT inhaler Inhale 2 puffs into the lungs every 6 (six) hours as needed for wheezing or shortness of breath.     alfuzosin (UROXATRAL) 10 MG 24 hr tablet TAKE 1 TABLET AT BEDTIME 90 tablet 3   allopurinol (ZYLOPRIM) 100 MG tablet Take 1 tablet (100 mg total) by mouth daily. 90 tablet 3   amoxicillin (AMOXIL) 500 MG capsule TAKE 4 CAPSULES BY MOUTH ONE HOUR PRIOR TO DENTAL APPOINTMENT/PROCEDURE.     ARNUITY ELLIPTA 100 MCG/ACT AEPB Inhale 1 puff into the lungs daily.  on any day albuterol is required for 30 days     atorvastatin (LIPITOR) 80 MG tablet TAKE 1 TABLET EVERY DAY 90 tablet 3   augmented betamethasone dipropionate (DIPROLENE-AF) 0.05 % cream SMARTSIG:sparingly Topical Twice Daily     buPROPion (WELLBUTRIN XL) 300 MG 24 hr tablet TAKE 1 TABLET EVERY DAY WITH BREAKFAST 90 tablet 3   Calcium Citrate-Vitamin D (CALCIUM CITRATE + D PO) Take 1 tablet by mouth 2 (two) times daily. 1200mg  of Calcium and 1000 units vitamin D3     cephALEXin (KEFLEX) 500 MG capsule Take 1 capsule (500 mg total) by mouth 4 (four) times daily. 20 capsule 0   Cholecalciferol (VITAMIN D) 50 MCG (2000 UT) tablet Take 4,000 Units by mouth in the morning and at bedtime.     clonazePAM (KLONOPIN) 1 MG tablet TAKE 1 TABLET THREE TIMES DAILY AS NEEDED FOR ANXIETY 90  tablet 2   CRANBERRY PO Take 1 tablet by mouth in the morning and at bedtime.     denosumab (PROLIA) 60 MG/ML SOSY injection Inject 60 mg as directed every 6 (six) months.     DULoxetine (CYMBALTA) 60 MG capsule TAKE 1 CAPSULE TWICE DAILY 180 capsule 3   EPINEPHrine 0.3 mg/0.3 mL IJ SOAJ injection Inject 0.3 mg into the muscle once as needed for anaphylaxis (severe allergic reaction).     ezetimibe (ZETIA) 10 MG tablet TAKE 1 TABLET EVERY DAY 90 tablet 3   feeding supplement (ENSURE ENLIVE / ENSURE PLUS) LIQD Take 237 mLs by mouth 2 (two) times daily between meals. (Patient taking differently: Take 237 mLs by mouth daily.) 237 mL 12   ferrous sulfate 325 (65 FE) MG tablet Take 325 mg by mouth 2 (two) times daily with a meal.     finasteride (PROSCAR) 5 MG tablet Take 5 mg by mouth daily.     HYDROcodone-acetaminophen (NORCO/VICODIN) 5-325 MG tablet Take 1 tablet by mouth every 4 (four) hours as needed.     lactobacillus acidophilus (BACID) TABS tablet Take 1 tablet by mouth daily.     levothyroxine (SYNTHROID) 125 MCG tablet Take 1 tablet (125 mcg total) by mouth daily at 6 (six) AM. 90 tablet 3   lidocaine-prilocaine (EMLA) cream  Apply 1 Application topically as needed. (Patient taking differently: Apply 1 Application topically as needed (For port access/chemotherapy).) 30 g 0   loratadine (CLARITIN) 10 MG tablet Take 10 mg by mouth daily.     metoprolol tartrate (LOPRESSOR) 25 MG tablet TAKE 1 TABLET TWICE DAILY 180 tablet 3   Multiple Vitamin (MULTIVITAMIN WITH MINERALS) TABS tablet Take 1 tablet by mouth every morning. Men's One-A-Day 50+     MYRBETRIQ 25 MG TB24 tablet TAKE 1 TABLET EVERY DAY (Patient taking differently: Take 25 mg by mouth at bedtime.) 90 tablet 3   nitroGLYCERIN (NITROSTAT) 0.4 MG SL tablet Place 0.4 mg under the tongue every 5 (five) minutes as needed for chest pain.     omeprazole (PRILOSEC) 40 MG capsule TAKE 1 CAPSULE EVERY DAY BEFORE BREAKFAST 90 capsule 3   Potassium  Citrate 15 MEQ (1620 MG) TBCR Take 1 tablet by mouth in the morning and at bedtime. 60 tablet 11   Probiotic Product (PROBIOTIC PO) Take 1 capsule by mouth daily with breakfast.     Risankizumab-rzaa (SKYRIZI Andrew) Inject into the skin. On hold     SKYRIZI PEN 150 MG/ML pen      solifenacin (VESICARE) 10 MG tablet Take 10 mg by mouth daily.     Specialty Vitamins Products (MG PLUS PROTEIN) 133 MG TABS Take 1 tablet by mouth 3 (three) times daily.     tamsulosin (FLOMAX) 0.4 MG CAPS capsule Take 1 capsule by mouth daily.     testosterone enanthate (DELATESTRYL) 200 MG/ML injection INJECT (200MG ) INTO THE MUSCLE EVERY 14 DAYS. DISCARD VIAL AFTER 28 DAYS. (Patient taking differently: Inject 200 mg into the muscle every 14 (fourteen) days. Every other Friday) 5 mL 3   vitamin C (ASCORBIC ACID) 500 MG tablet Take 500 mg by mouth daily with lunch.     Magnesium 250 MG TABS Take 250 mg by mouth daily. (Patient not taking: Reported on 11/29/2023)     No facility-administered medications prior to visit.    ROS: Review of Systems  Constitutional:  Negative for appetite change, fatigue and unexpected weight change.  HENT:  Negative for congestion, nosebleeds, sneezing, sore throat and trouble swallowing.   Eyes:  Negative for itching and visual disturbance.  Respiratory:  Positive for cough. Negative for wheezing.   Cardiovascular:  Negative for chest pain, palpitations and leg swelling.  Gastrointestinal:  Negative for abdominal distention, blood in stool, diarrhea and nausea.  Genitourinary:  Negative for frequency and hematuria.  Musculoskeletal:  Positive for arthralgias, back pain and gait problem. Negative for joint swelling and neck pain.  Skin:  Negative for rash.  Neurological:  Negative for dizziness, tremors, speech difficulty and weakness.  Psychiatric/Behavioral:  Negative for agitation, dysphoric mood and sleep disturbance. The patient is not nervous/anxious.     Objective:  BP  102/70   Pulse 76   Temp 98.3 F (36.8 C) (Oral)   Ht 6\' 3"  (1.905 m)   Wt 253 lb (114.8 kg)   SpO2 95%   BMI 31.62 kg/m   BP Readings from Last 3 Encounters:  11/29/23 102/70  11/21/23 125/78  08/13/23 127/76    Wt Readings from Last 3 Encounters:  11/29/23 253 lb (114.8 kg)  08/13/23 240 lb (108.9 kg)  07/18/23 241 lb 6.4 oz (109.5 kg)    Physical Exam Constitutional:      General: He is not in acute distress.    Appearance: He is well-developed.     Comments: NAD  Eyes:     Conjunctiva/sclera: Conjunctivae normal.     Pupils: Pupils are equal, round, and reactive to light.  Neck:     Thyroid: No thyromegaly.     Vascular: No JVD.  Cardiovascular:     Rate and Rhythm: Normal rate and regular rhythm.     Heart sounds: Normal heart sounds. No murmur heard.    No friction rub. No gallop.  Pulmonary:     Effort: Pulmonary effort is normal. No respiratory distress.     Breath sounds: Normal breath sounds. No wheezing or rales.  Chest:     Chest wall: No tenderness.  Abdominal:     General: Bowel sounds are normal. There is no distension.     Palpations: Abdomen is soft. There is no mass.     Tenderness: There is no abdominal tenderness. There is no guarding or rebound.  Musculoskeletal:        General: No tenderness. Normal range of motion.     Cervical back: Normal range of motion.     Right lower leg: No edema.     Left lower leg: No edema.  Lymphadenopathy:     Cervical: No cervical adenopathy.  Skin:    General: Skin is warm and dry.     Findings: No rash.  Neurological:     Mental Status: He is alert.     Cranial Nerves: No cranial nerve deficit.     Motor: Weakness present. No abnormal muscle tone.     Coordination: Coordination normal.     Gait: Gait abnormal.     Deep Tendon Reflexes: Reflexes are normal and symmetric.  Psychiatric:        Behavior: Behavior normal.        Thought Content: Thought content normal.        Judgment: Judgment normal.      Lab Results  Component Value Date   WBC 6.6 11/21/2023   HGB 15.1 11/21/2023   HCT 47.0 11/21/2023   PLT 175 11/21/2023   GLUCOSE 95 11/21/2023   CHOL 102 01/28/2023   TRIG 68 01/28/2023   HDL 51 01/28/2023   LDLDIRECT 37 01/28/2023   LDLCALC 36 01/28/2023   ALT 20 11/21/2023   AST 21 11/21/2023   NA 137 11/21/2023   K 4.7 11/21/2023   CL 102 11/21/2023   CREATININE 1.66 (H) 11/21/2023   BUN 27 (H) 11/21/2023   CO2 28 11/21/2023   TSH 14.031 (H) 09/06/2022   PSA 1.08 06/26/2020   INR 1.0 08/13/2023   HGBA1C 5.8 (H) 02/06/2013    No results found.  Assessment & Plan:   Problem List Items Addressed This Visit     Hypothyroidism - Primary (Chronic)   On levothyroxine 125 mcg a day Labs at [facility]      Low back pain radiating to both legs (Chronic)   Cont on Norco prn  Potential benefits of a long term opioids use as well as potential risks (i.e. addiction risk, apnea etc) and complications (i.e. Somnolence, constipation and others) were explained to the patient and were aknowledged.  Pt was in PT at home - finished      Morbid obesity (HCC)   In a PREP program      Diffuse large B-cell lymphoma [facility])   Medical Oncologist: Dr. Alphonsa Gin Radiation Oncologist:Dr. Kizzie Bane  A relapsed DLBCL with oncologic history described as above. He was initially diagnosed in 2019 and treated with R-CHOP alone for 6 cycles. He was NED for  3 years and then April 2023 presented with a left buttock lesion and generalized malaise, at which point a PET/CT identified multifocal progression in the right nasopharynx, right neck, pelvis and groin lymph nodes as well as left buttock cutaneous and soft tissue lesions. He has since received 3 cycles of R GDP that has been complicated by multiple bouts of urosepsis secondary to colonization with ESBL. He saw Dr. Alphonsa Gin recently for consideration of salvage systemic therapy options and apparently bite versus CAR-T therapy are being considered.  On  R-GDP monthly         No orders of the defined types were placed in this encounter.     Follow-up: Return in about 3 months (around 02/26/2024) for a follow-up visit.  Sonda Primes, MD

## 2023-11-29 NOTE — Assessment & Plan Note (Signed)
Cont on Norco prn  Potential benefits of a long term opioids use as well as potential risks (i.e. addiction risk, apnea etc) and complications (i.e. Somnolence, constipation and others) were explained to the patient and were aknowledged.  Pt was in PT at home - finished

## 2023-11-29 NOTE — Assessment & Plan Note (Addendum)
Medical Oncologist: Dr. Alphonsa Gin Radiation Oncologist:Dr. Kizzie Bane  A relapsed DLBCL with oncologic history described as above. He was initially diagnosed in 2019 and treated with R-CHOP alone for 6 cycles. He was NED for 3 years and then April 2023 presented with a left buttock lesion and generalized malaise, at which point a PET/CT identified multifocal progression in the right nasopharynx, right neck, pelvis and groin lymph nodes as well as left buttock cutaneous and soft tissue lesions. He has since received 3 cycles of R GDP that has been complicated by multiple bouts of urosepsis secondary to colonization with ESBL. He saw Dr. Alphonsa Gin recently for consideration of salvage systemic therapy options and apparently bite versus CAR-T therapy are being considered.  On R-GDP monthly

## 2023-11-29 NOTE — Assessment & Plan Note (Signed)
In a PREP program

## 2023-11-29 NOTE — Assessment & Plan Note (Signed)
On levothyroxine 125 mcg a day Labs at [facility]

## 2023-11-30 ENCOUNTER — Other Ambulatory Visit: Payer: Self-pay

## 2023-12-09 ENCOUNTER — Ambulatory Visit (HOSPITAL_BASED_OUTPATIENT_CLINIC_OR_DEPARTMENT_OTHER): Payer: Medicare PPO | Admitting: Family

## 2023-12-09 ENCOUNTER — Encounter (HOSPITAL_BASED_OUTPATIENT_CLINIC_OR_DEPARTMENT_OTHER): Payer: Self-pay | Admitting: Family

## 2023-12-09 VITALS — BP 124/80 | HR 86 | Ht 75.0 in | Wt 250.8 lb

## 2023-12-09 DIAGNOSIS — I10 Essential (primary) hypertension: Secondary | ICD-10-CM

## 2023-12-09 DIAGNOSIS — I5022 Chronic systolic (congestive) heart failure: Secondary | ICD-10-CM | POA: Diagnosis not present

## 2023-12-09 DIAGNOSIS — E785 Hyperlipidemia, unspecified: Secondary | ICD-10-CM | POA: Diagnosis not present

## 2023-12-09 DIAGNOSIS — I4811 Longstanding persistent atrial fibrillation: Secondary | ICD-10-CM | POA: Diagnosis not present

## 2023-12-09 DIAGNOSIS — I25118 Atherosclerotic heart disease of native coronary artery with other forms of angina pectoris: Secondary | ICD-10-CM

## 2023-12-09 NOTE — Patient Instructions (Signed)
 Medication Instructions:  Your physician recommends that you continue on your current medications as directed. Please refer to the Current Medication list given to you today.  Testing/Procedures: Your physician has requested that you have an echocardiogram. Echocardiography is a painless test that uses sound waves to create images of your heart. It provides your doctor with information about the size and shape of your heart and how well your heart's chambers and valves are working. This procedure takes approximately one hour. There are no restrictions for this procedure. Please do NOT wear cologne, perfume, aftershave, or lotions (deodorant is allowed). Please arrive 15 minutes prior to your appointment time.  Please note: We ask at that you not bring children with you during ultrasound (echo/ vascular) testing. Due to room size and safety concerns, children are not allowed in the ultrasound rooms during exams. Our front office staff cannot provide observation of children in our lobby area while testing is being conducted. An adult accompanying a patient to their appointment will only be allowed in the ultrasound room at the discretion of the ultrasound technician under special circumstances. We apologize for any inconvenience.   Follow-Up: At [facility], you and your health needs are our priority.  As part of our continuing mission to provide you with exceptional heart care, we have created designated Provider Care Teams.  These Care Teams include your primary Cardiologist (physician) and Advanced Practice Providers (APPs -  Physician Assistants and Nurse Practitioners) who all work together to provide you with the care you need, when you need it.  We recommend signing up for the patient portal called "MyChart".  Sign up information is provided on this After Visit Summary.  MyChart is used to connect with patients for Virtual Visits (Telemedicine).  Patients are able to view lab/test  results, encounter notes, upcoming appointments, etc.  Non-urgent messages can be sent to your provider as well.   To learn more about what you can do with MyChart, go to [email or web address].    Your next appointment:   6 months with Gillian Shields, NP

## 2023-12-09 NOTE — Progress Notes (Signed)
 Cardiology Office Note:  .   Date:  12/09/2023  ID:  HOYT LEANOS, DOB [birth year], MRN [id number] PCP: Tresa Garter, MD  [facility] Providers Cardiologist:  Chilton Si, MD Cardiology APP:  Alver Sorrow, NP    History of Present Illness: .   [name] is a 78 y.o. male with a hx of relapsed large B-cell lymphoma, CAD, recurrent UTI, atrial fibrillation, BPH, diastolic heart failure, hypertension, hyperlipidemia, OSA, hypothyroid, prior tobacco use.   Initially diagnosed atrial fibrillation around 2012 and started on diltiazem and amiodarone.  Amiodarone discontinued due to persistent atrial fibrillation.  Underwent LHC at time of diagnosis with reported 90% obstructive of unknown vessel that developed collaterals.  He was started on Ranexa.  Echo 10/2016 EF 60 to 65%, severe bilateral atrial enlargement, IVC dilated.  He had no angina so Ranexa discontinued.   Diagnosed with diffuse large B-cell lymphoma 05/2018.  Completed 6 cycles of chemotherapy.  Based on echo 06/2018 EF 50 to 55%, normal diastolic function, apical hypokinesis.  Heart rate poorly controlled to metoprolol added.  Seen in the ED and BNP elevated to Lasix 20 mg daily added with improvement in breathing.  Echo 06/2019 EF 50-55%, RV mildly reduced systolic function.  Lasix was transitioned to hydrochlorothiazide due to urinary frequency and poor blood pressure control.  LHC 08/2019 significant two-vessel CAD sequential 60 to 70% mid LAD lesions and CTO of mid RCA with bridging left-to-right collaterals.  Consideration for two-vessel CABG versus medical management.  Deemed not candidate for surgery.  He was started on Imdur.  Due to kidney stones and urination HCTZ reduced and losartan increased.   03/22/22 chemo resumed due to recurrence of diffuse large b-cell lymphoma.    Admitted 7/13 - 04/27/2022 after presenting with fatigue, lightheadedness found to have severe sepsis treated with IVF and IV abx  due to urinary tract infection E. coli ESBL. Due to orthostatic hypotension and near syncope, diltiazem dose reduced. Xarelto and aspirin held due to thrombocytopenia. Seen 05/04/2022 by his oncology team and referred to ID  for recurrent UTI.  He was set up for IV ABX through home care.   He has been off Xarelto since 10/18/22 due to hematuria, frequent falls as risks outweighed benefits.    He presents today for follow-up independently. He is undergoing Epcoritamab bispecific for refractory DLBCL with [facility] oncology.  Denies chest pain, orthopnea, edema, PND.  Mild exertional dyspnea stable at baseline.  Recently enjoyed a trip with his husband to [place]. Weight up 9 lbs since last visit which he attributes to dietary indiscretion while on vacation. No recurrent hematuria.   ROS: Please see the history of present illness.    All other systems reviewed and are negative.   Studies Reviewed: .           Risk Assessment/Calculations:    CHA2DS2-VASc Score = 4   This indicates a 4.8% annual risk of stroke. The patient's score is based upon: CHF History: 1 HTN History: 1 Diabetes History: 0 Stroke History: 0 Vascular Disease History: 0 Age Score: 2 Gender Score: 0            Physical Exam:   VS:  BP 124/80   Pulse 86   Ht 6\' 3"  (1.905 m)   Wt 250 lb 12.8 oz (113.8 kg)   SpO2 98%   BMI 31.35 kg/m    Wt Readings from Last 3 Encounters:  12/09/23 250 lb 12.8 oz (113.8 kg)  11/29/23 253 lb (114.8 kg)  08/13/23 240 lb (108.9 kg)    GEN: Well nourished, well developed in no acute distress NECK: No JVD; No carotid bruits CARDIAC: IRIR, no murmurs, rubs, gallops RESPIRATORY:  Clear to auscultation without rales, wheezing or rhonchi  ABDOMEN: Soft, non-tender, non-distended EXTREMITIES:  No edema; No deformity. R inner ankle with small abrasion, bandaid placed in clinic.   ASSESSMENT AND PLAN: .    Longstanding persistent atrial fibrillation - Xarelto has been discontinued  due to thrombocytosis, hematuria, fall risk. We discussed risk/benefit and he prefers to remain off OAC at this time and verbalizes understanding of CVA risk.  Asymptomatic regarding atrial fibrillation.Rate controlled with heart rate routinely less than 100 bpm at home per his report.  Continue metoprolol tartrate 25 mg twice daily.    CHA2DS2-VASc Score = 4 [CHF History: 1, HTN History: 1, Diabetes History: 0, Stroke History: 0, Vascular Disease History: 0, Age Score: 2, Gender Score: 0].  Therefore, the patient's annual risk of stroke is 4.8 %.       B cell lymphoma - On Epcoritamab per oncology at [facility].     Recurrent UTI -  Followed with Dr. Daiva Eves of ID. Hematuria has been much improved since discontinuation of Xarelto. On daily Keflex.    Hypothyroidism - Continue to follow with PCP.    CAD -medically managed moderate to severe CAD. Stable with no anginal symptoms. No indication for ischemic evaluation.  GDMT metoprolol, atorvastatin.  Imdur previously discontinued due to hypotension.    Systolic and diastolic heart failure - Euvolemic and well compensated on exam.  Echo 06/2019 LVEF 50-55% ? Echo 08/2022 LVEF 35-40%. No indication for loop diuretic at this time.  Continue beta-blocker at present dose. Losartan, imdur previously discontinued due to hypotension. Low sodium diet, fluid restriction <2L, and daily weights encouraged. Educated to contact our office for weight gain of 2 lbs overnight or 5 lbs in one week.  Update echocardiogram for monitoring.    Hypertension - BP well controlled. Continue current antihypertensive regimen.  Previous hypotension has resolved.   Hyperlipidemia, LDL goal less than 70 -4/24 for LDL 36.  Continue atorvastatin.  Anticipate upcoming AWV with PCP will include labs.        Dispo: follow up in 6 months  Signed, Alver Sorrow, NP

## 2023-12-13 DIAGNOSIS — F410 Panic disorder [episodic paroxysmal anxiety] without agoraphobia: Secondary | ICD-10-CM | POA: Diagnosis not present

## 2023-12-13 DIAGNOSIS — F331 Major depressive disorder, recurrent, moderate: Secondary | ICD-10-CM | POA: Diagnosis not present

## 2023-12-13 DIAGNOSIS — J3089 Other allergic rhinitis: Secondary | ICD-10-CM | POA: Diagnosis not present

## 2023-12-13 DIAGNOSIS — J301 Allergic rhinitis due to pollen: Secondary | ICD-10-CM | POA: Diagnosis not present

## 2023-12-14 DIAGNOSIS — C8330 Diffuse large B-cell lymphoma, unspecified site: Secondary | ICD-10-CM | POA: Diagnosis not present

## 2023-12-14 DIAGNOSIS — Z5112 Encounter for antineoplastic immunotherapy: Secondary | ICD-10-CM | POA: Diagnosis not present

## 2023-12-14 DIAGNOSIS — C8338 Diffuse large B-cell lymphoma, lymph nodes of multiple sites: Secondary | ICD-10-CM | POA: Diagnosis not present

## 2023-12-14 DIAGNOSIS — Z9221 Personal history of antineoplastic chemotherapy: Secondary | ICD-10-CM | POA: Diagnosis not present

## 2023-12-15 DIAGNOSIS — C8331 Diffuse large B-cell lymphoma, lymph nodes of head, face, and neck: Secondary | ICD-10-CM | POA: Diagnosis not present

## 2023-12-15 DIAGNOSIS — C8338 Diffuse large B-cell lymphoma, lymph nodes of multiple sites: Secondary | ICD-10-CM | POA: Diagnosis not present

## 2023-12-19 ENCOUNTER — Other Ambulatory Visit: Payer: Self-pay | Admitting: Internal Medicine

## 2023-12-21 DIAGNOSIS — G4733 Obstructive sleep apnea (adult) (pediatric): Secondary | ICD-10-CM | POA: Diagnosis not present

## 2023-12-21 DIAGNOSIS — Z9181 History of falling: Secondary | ICD-10-CM | POA: Diagnosis not present

## 2023-12-21 DIAGNOSIS — E039 Hypothyroidism, unspecified: Secondary | ICD-10-CM | POA: Diagnosis not present

## 2023-12-21 DIAGNOSIS — Z5181 Encounter for therapeutic drug level monitoring: Secondary | ICD-10-CM | POA: Diagnosis not present

## 2023-12-21 DIAGNOSIS — I4891 Unspecified atrial fibrillation: Secondary | ICD-10-CM | POA: Diagnosis not present

## 2023-12-21 DIAGNOSIS — M8589 Other specified disorders of bone density and structure, multiple sites: Secondary | ICD-10-CM | POA: Diagnosis not present

## 2023-12-21 DIAGNOSIS — C8590 Non-Hodgkin lymphoma, unspecified, unspecified site: Secondary | ICD-10-CM | POA: Diagnosis not present

## 2023-12-21 DIAGNOSIS — F32A Depression, unspecified: Secondary | ICD-10-CM | POA: Diagnosis not present

## 2023-12-21 DIAGNOSIS — Z8719 Personal history of other diseases of the digestive system: Secondary | ICD-10-CM | POA: Diagnosis not present

## 2023-12-21 DIAGNOSIS — M810 Age-related osteoporosis without current pathological fracture: Secondary | ICD-10-CM | POA: Diagnosis not present

## 2023-12-21 DIAGNOSIS — F411 Generalized anxiety disorder: Secondary | ICD-10-CM | POA: Diagnosis not present

## 2023-12-27 DIAGNOSIS — M810 Age-related osteoporosis without current pathological fracture: Secondary | ICD-10-CM | POA: Diagnosis not present

## 2023-12-27 DIAGNOSIS — H903 Sensorineural hearing loss, bilateral: Secondary | ICD-10-CM | POA: Diagnosis not present

## 2024-01-10 DIAGNOSIS — F410 Panic disorder [episodic paroxysmal anxiety] without agoraphobia: Secondary | ICD-10-CM | POA: Diagnosis not present

## 2024-01-10 DIAGNOSIS — F331 Major depressive disorder, recurrent, moderate: Secondary | ICD-10-CM | POA: Diagnosis not present

## 2024-01-11 DIAGNOSIS — Z5112 Encounter for antineoplastic immunotherapy: Secondary | ICD-10-CM | POA: Diagnosis not present

## 2024-01-11 DIAGNOSIS — C8330 Diffuse large B-cell lymphoma, unspecified site: Secondary | ICD-10-CM | POA: Diagnosis not present

## 2024-01-11 DIAGNOSIS — C83398 Diffuse large b-cell lymphoma of other extranodal and solid organ sites: Secondary | ICD-10-CM | POA: Diagnosis not present

## 2024-01-16 ENCOUNTER — Other Ambulatory Visit: Payer: Self-pay | Admitting: Internal Medicine

## 2024-01-24 ENCOUNTER — Emergency Department (HOSPITAL_BASED_OUTPATIENT_CLINIC_OR_DEPARTMENT_OTHER)
Admission: EM | Admit: 2024-01-24 | Discharge: 2024-01-24 | Disposition: A | Discharge status: Home / Self Care | Attending: Emergency Medicine | Admitting: Emergency Medicine

## 2024-01-24 ENCOUNTER — Encounter (HOSPITAL_BASED_OUTPATIENT_CLINIC_OR_DEPARTMENT_OTHER): Payer: Self-pay

## 2024-01-24 ENCOUNTER — Other Ambulatory Visit: Payer: Self-pay

## 2024-01-24 DIAGNOSIS — I110 Hypertensive heart disease with heart failure: Secondary | ICD-10-CM | POA: Insufficient documentation

## 2024-01-24 DIAGNOSIS — I2510 Atherosclerotic heart disease of native coronary artery without angina pectoris: Secondary | ICD-10-CM | POA: Insufficient documentation

## 2024-01-24 DIAGNOSIS — Z79899 Other long term (current) drug therapy: Secondary | ICD-10-CM | POA: Insufficient documentation

## 2024-01-24 DIAGNOSIS — R300 Dysuria: Secondary | ICD-10-CM | POA: Insufficient documentation

## 2024-01-24 DIAGNOSIS — I5030 Unspecified diastolic (congestive) heart failure: Secondary | ICD-10-CM | POA: Insufficient documentation

## 2024-01-24 LAB — URINALYSIS, ROUTINE W REFLEX MICROSCOPIC
Bilirubin Urine: NEGATIVE
Glucose, UA: NEGATIVE mg/dL
Ketones, ur: NEGATIVE mg/dL
Nitrite: NEGATIVE
Protein, ur: 30 mg/dL — AB
RBC / HPF: >50 RBC/hpf (ref 0–5)
Specific Gravity, Urine: 1.017 (ref 1.005–1.030)
WBC, UA: >50 WBC/hpf (ref 0–5)
pH: 8 (ref 5.0–8.0)

## 2024-01-24 MED ORDER — CEPHALEXIN 500 MG PO CAPS: 500.0000 mg | ORAL_CAPSULE | Freq: Four times a day (QID) | ORAL | 0 refills | Status: DC

## 2024-01-24 NOTE — ED Notes (Signed)
Patient refused to put on gown

## 2024-01-24 NOTE — ED Triage Notes (Signed)
 In for eval of chronic UTI flare up onset 2-3 days ago.

## 2024-01-24 NOTE — ED Provider Notes (Signed)
 [place] EMERGENC[facility] DEPARTMENT AT [facility] Provider Note   CSN: [id number] Arrival date & time: 01/24/24  0844     History  Chief Complaint  Patient presents with   Dysuria    [name] is a 78 y.o. male.  Patient with hx of large B-cell lymphoma, CAD, recurrent UTI, atrial fibrillation, BPH, diastolic heart failure, hypertension, hyperlipidemia, OSA, hypothyroid --presents to the emergency department for his typical UTI symptoms.  This includes dysuria.  Symptoms started about a week ago.  He denies associated fevers, nausea or vomiting, weakness, altered mental status or confusion.  No blood in the urine.  No abdominal pain.  Denies history of colitis or diverticulitis.  Patient was seen 2 months ago for similar symptoms, prescribed Keflex, improved.  He states that in the past his infections have become more severe causing altered mental status and falls.  He does not currently have the symptoms.  He receives that he gets intermittent injections for his lymphoma.  Last admission 09/2022.       Home Medications Prior to Admission medications   Medication Sig Start Date End Date Taking? Authorizing Provider  acetaminophen (TYLENOL) 325 MG tablet Take 2 tablets (650 mg total) by mouth every 6 (six) hours as needed for mild pain (or Fever >/= 101). 03/25/22   Marguerita Merles Latif, DO  acyclovir (ZOVIRAX) 400 MG tablet Take 1 tablet (400 mg total) by mouth 2 (two) times daily. 09/07/23   Chilton Si, MD  albuterol (VENTOLIN HFA) 108 (90 Base) MCG/ACT inhaler Inhale 2 puffs into the lungs every 6 (six) hours as needed for wheezing or shortness of breath.    [provider]  alfuzosin (UROXATRAL) 10 MG 24 hr tablet TAKE 1 TABLET AT BEDTIME 08/16/23   McKenzie, Mardene Celeste, MD  allopurinol (ZYLOPRIM) 100 MG tablet Take 1 tablet (100 mg total) by mouth daily. 05/30/23   Plotnikov, Georgina Quint, MD  amoxicillin (AMOXIL) 500 MG capsule TAKE 4 CAPSULES BY MOUTH ONE  HOUR PRIOR TO DENTAL APPOINTMENT/PROCEDURE. 10/26/22   [provider]  ARNUITY ELLIPTA 100 MCG/ACT AEPB Inhale 1 puff into the lungs daily.  on any day albuterol is required for 30 days 05/25/23   [provider]  atorvastatin (LIPITOR) 80 MG tablet TAKE 1 TABLET EVERY DAY 08/15/23   Alver Sorrow, NP  augmented betamethasone dipropionate (DIPROLENE-AF) 0.05 % cream SMARTSIG:sparingly Topical Twice Daily 11/11/23   [provider]  buPROPion (WELLBUTRIN XL) 300 MG 24 hr tablet TAKE 1 TABLET EVERY DAY WITH BREAKFAST 12/19/23   Plotnikov, Georgina Quint, MD  Calcium Citrate-Vitamin D (CALCIUM CITRATE + D PO) Take 1 tablet by mouth 2 (two) times daily. 1200mg  of Calcium and 1000 units vitamin D3    [provider]  cephALEXin (KEFLEX) 500 MG capsule Take 1 capsule (500 mg total) by mouth 4 (four) times daily. 11/21/23   Peter Garter, PA  Cholecalciferol (VITAMIN D) 50 MCG (2000 UT) tablet Take 4,000 Units by mouth in the morning and at bedtime.    [provider]  clonazePAM (KLONOPIN) 1 MG tablet TAKE 1 TABLET THREE TIMES DAILY AS NEEDED FOR ANXIETY 12/19/23   Plotnikov, Georgina Quint, MD  CRANBERRY PO Take 1 tablet by mouth in the morning and at bedtime.    [provider]  denosumab (PROLIA) 60 MG/ML SOSY injection Inject 60 mg as directed every 6 (six) months. 12/10/15   [provider]  DULoxetine (CYMBALTA) 60 MG capsule TAKE 1 CAPSULE TWICE  DAILY 01/16/24   Plotnikov, Georgina Quint, MD  EPINEPHrine 0.3 mg/0.3 mL IJ SOAJ injection Inject 0.3 mg into the muscle once as needed for anaphylaxis (severe allergic reaction). 07/31/21   [provider]  ezetimibe (ZETIA) 10 MG tablet TAKE 1 TABLET EVERY DAY 08/25/23   Chilton Si, MD  feeding supplement (ENSURE ENLIVE / ENSURE PLUS) LIQD Take 237 mLs by mouth 2 (two) times daily between meals. Patient taking differently: Take 237 mLs by mouth daily. 03/25/22   Marguerita Merles Latif, DO  ferrous  sulfate 325 (65 FE) MG tablet Take 325 mg by mouth 2 (two) times daily with a meal.    [provider]  finasteride (PROSCAR) 5 MG tablet Take 5 mg by mouth daily. 09/25/23   [provider]  HYDROcodone-acetaminophen (NORCO/VICODIN) 5-325 MG tablet Take 1 tablet by mouth every 4 (four) hours as needed. 03/17/23   [provider]  lactobacillus acidophilus (BACID) TABS tablet Take 1 tablet by mouth daily.    [provider]  levothyroxine (SYNTHROID) 125 MCG tablet Take 1 tablet (125 mcg total) by mouth daily at 6 (six) AM. 10/28/23   Plotnikov, Georgina Quint, MD  lidocaine-prilocaine (EMLA) cream Apply 1 Application topically as needed. Patient taking differently: Apply 1 Application topically as needed (For port access/chemotherapy). 04/21/22   Johney Maine, MD  loratadine (CLARITIN) 10 MG tablet Take 10 mg by mouth daily.    [provider]  Magnesium 250 MG TABS Take 250 mg by mouth daily.    [provider]  metoprolol tartrate (LOPRESSOR) 25 MG tablet TAKE 1 TABLET TWICE DAILY 07/18/23   Alver Sorrow, NP  Multiple Vitamin (MULTIVITAMIN WITH MINERALS) TABS tablet Take 1 tablet by mouth every morning. Men's One-A-Day 50+    [provider]  MYRBETRIQ 25 MG TB24 tablet TAKE 1 TABLET EVERY DAY Patient taking differently: Take 25 mg by mouth at bedtime. 10/06/21   McKenzie, Mardene Celeste, MD  nitroGLYCERIN (NITROSTAT) 0.4 MG SL tablet Place 0.4 mg under the tongue every 5 (five) minutes as needed for chest pain.    [provider]  omeprazole (PRILOSEC) 40 MG capsule TAKE 1 CAPSULE EVERY DAY BEFORE BREAKFAST 01/16/24   Plotnikov, Georgina Quint, MD  Potassium Citrate 15 MEQ (1620 MG) TBCR Take 1 tablet by mouth in the morning and at bedtime. 06/21/23   McKenzie, Mardene Celeste, MD  Probiotic Product (PROBIOTIC PO) Take 1 capsule by mouth daily with breakfast.    [provider]  Risankizumab-rzaa [facility] [place]) Inject into the skin.  On hold    [provider]  SKYRIZI PEN 150 MG/ML pen  09/10/23   [provider]  solifenacin (VESICARE) 10 MG tablet Take 10 mg by mouth daily.    [provider]  Specialty Vitamins Products (MG PLUS PROTEIN) 133 MG TABS Take 1 tablet by mouth 3 (three) times daily. 11/22/22   [provider]  tamsulosin (FLOMAX) 0.4 MG CAPS capsule Take 1 capsule by mouth daily.    [provider]  testosterone enanthate (DELATESTRYL) 200 MG/ML injection INJECT (200MG ) INTO THE MUSCLE EVERY 14 DAYS. DISCARD VIAL AFTER 28 DAYS. Patient taking differently: Inject 200 mg into the muscle every 14 (fourteen) days. Every other Friday 09/22/21   Plotnikov, Georgina Quint, MD  vitamin C (ASCORBIC ACID) 500 MG tablet Take 500 mg by mouth daily with lunch.    [provider]      Allergies    Short ragweed pollen ext  Review of Systems   Review of Systems  Physical Exam Updated Vital Signs BP 126/69 (BP Location: Right Arm)   Pulse 79   Temp 98 F (36.7 C)   Resp 18   Ht 6\' 4"  (1.93 m)   Wt 111.1 kg   SpO2 100%   BMI 29.82 kg/m  Physical Exam Vitals and nursing note reviewed.  Constitutional:      General: He is not in acute distress.    Appearance: He is well-developed.  HENT:     Head: Normocephalic and atraumatic.  Eyes:     General:        Right eye: No discharge.        Left eye: No discharge.     Conjunctiva/sclera: Conjunctivae normal.  Cardiovascular:     Rate and Rhythm: Normal rate and regular rhythm.     Heart sounds: Normal heart sounds.  Pulmonary:     Effort: Pulmonary effort is normal.     Breath sounds: Normal breath sounds.  Abdominal:     Palpations: Abdomen is soft.     Tenderness: There is no abdominal tenderness.     Comments: No tenderness, rebound or guarding.  Musculoskeletal:     Cervical back: Normal range of motion and neck supple.  Skin:    General: Skin is warm and dry.  Neurological:     Mental Status:  He is alert.     ED Results / Procedures / Treatments   Labs (all labs ordered are listed, but only abnormal results are displayed) Labs Reviewed  URINALYSIS, ROUTINE W REFLEX MICROSCOPIC - Abnormal; Notable for the following components:      Result Value   APPearance HAZY (*)    Hgb urine dipstick MODERATE (*)    Protein, ur 30 (*)    Leukocytes,Ua LARGE (*)    Bacteria, UA RARE (*)    All other components within normal limits  URINE CULTURE    EKG None  Radiology No results found.  Procedures Procedures    Medications Ordered in ED Medications - No data to display  ED Course/ Medical Decision Making/ A&P    Patient seen and examined. History obtained directly from patient.   Labs/EKG: Ordered UA and urine culture  Imaging: None ordered  Medications/Fluids: None ordered  Most recent vital signs reviewed and are as follows: BP 126/69 (BP Location: Right Arm)   Pulse 79   Temp 98 F (36.7 C)   Resp 18   Ht 6\' 4"  (1.93 m)   Wt 111.1 kg   SpO2 100%   BMI 29.82 kg/m   Initial impression: Very well-appearing patient with dysuria.  History of recurrent UTI.  Most recent urine cultures have had insignificant growth or growth of multiple species.  He has had E. coli (resistant to penicillins and Bactrim, sensitive to cephalosporins) and Enterobacter infections (pan sensitive) in the past year which were sensitive.   10:45 AM Reassessment performed. Patient appears very comfortable.  Labs personally reviewed and interpreted including: UA with greater than 50 whites and reds under microscopy, large leuk esterase, clean-catch.   Reviewed pertinent lab work and imaging with patient at bedside. Questions answered.   Most current vital signs reviewed and are as follows: BP (!) 106/56 (BP Location: Right Arm)   Pulse 69   Temp 98 F (36.7 C)   Resp 16   Ht 6\' 4"  (1.93 m)   Wt 111.1 kg   SpO2 95%   BMI 29.82 kg/m  Plan: Discharge to home.   Prescriptions  written for: Keflex.  This seemed to work well for him last time.  Limited guidance with the recent urine cultures.  Other home care instructions discussed: Close monitoring of symptoms  ED return instructions discussed: Return with fever, worsening pain, confusion, falls, new or worsening symptoms  Follow-up instructions discussed: Patient encouraged to follow-up with their PCP in 3 days.                                Medical Decision Making Amount and/or Complexity of Data Reviewed Labs: ordered.  Risk Prescription drug management.   Patient with recurrent UTI presents with dysuria.  No signs of pyelonephritis.  He has had some generalized symptoms with UTI in the past, not currently present.  No fevers.  Looks exceedingly well.  No significant abdominal pain to suggest diverticulitis, colitis, or other intra-abdominal process.  The patient's vital signs, pertinent lab work and imaging were reviewed and interpreted as discussed in the ED course. Hospitalization was considered for further testing, treatments, or serial exams/observation. However as patient is well-appearing, has a stable exam, and reassuring studies today, I do not feel that they warrant admission at this time. This plan was discussed with the patient who verbalizes agreement and comfort with this plan and seems reliable and able to return to the Emergency Department with worsening or changing symptoms.          Final Clinical Impression(s) / ED Diagnoses Final diagnoses:  Dysuria    Rx / DC Orders ED Discharge Orders          Ordered    cephALEXin (KEFLEX) 500 MG capsule  4 times daily        01/24/24 1041              Lyna Sandhoff, PA-C 01/24/24 1047    Rosealee Concha, MD 01/24/24 1056

## 2024-01-24 NOTE — Discharge Instructions (Signed)
 Please read and follow all provided instructions.  Your diagnoses today include:  1. Dysuria    Tests performed today include: Urine test - suggests that you have an infection in your bladder Culture sent: Results pending Vital signs. See below for your results today.   Medications prescribed:  Keflex (cephalexin) - antibiotic  You have been prescribed an antibiotic medicine: take the entire course of medicine even if you are feeling better. Stopping early can cause the antibiotic not to work.  Home care instructions:  Follow any educational materials contained in this packet.  Follow-up instructions: Please follow-up with your primary care provider in 3 days if symptoms are not resolved for further evaluation of your symptoms.  Return instructions:  Please return to the Emergency Department if you experience worsening symptoms.  Return with fever, worsening pain, persistent vomiting, worsening pain in your back.  Please return if you have any other emergent concerns.  Additional Information:  Your vital signs today were: BP (!) 106/56 (BP Location: Right Arm)   Pulse 69   Temp 98 F (36.7 C)   Resp 16   Ht 6\' 4"  (1.93 m)   Wt 111.1 kg   SpO2 95%   BMI 29.82 kg/m  If your blood pressure (BP) was elevated above 135/85 this visit, please have this repeated by your doctor within one month. --------------

## 2024-01-24 NOTE — ED Notes (Signed)
 Discharge paperwork given and verbally understood.

## 2024-01-25 LAB — URINE CULTURE: Culture: NO GROWTH

## 2024-01-30 ENCOUNTER — Other Ambulatory Visit: Payer: Self-pay | Admitting: Internal Medicine

## 2024-02-02 ENCOUNTER — Ambulatory Visit: Payer: Medicare PPO

## 2024-02-02 VITALS — Ht 75.0 in | Wt 245.0 lb

## 2024-02-02 DIAGNOSIS — Z0000 Encounter for general adult medical examination without abnormal findings: Secondary | ICD-10-CM

## 2024-02-02 NOTE — Patient Instructions (Signed)
 Mr. Dimond , Thank you for taking time to come for your Medicare Wellness Visit. I appreciate your ongoing commitment to your health goals. Please review the following plan we discussed and let me know if I can assist you in the future.   Referrals/Orders/Follow-Ups/Clinician Recommendations: It was nice talking with you today.  Keep up the good work.  This is a list of the screening recommended for you and due dates:  Health Maintenance  Topic Date Due   Screening for Lung Cancer  04/23/2023   COVID-19 Vaccine (9 - 2024-25 season) 09/26/2023   Flu Shot  05/11/2024   Medicare Annual Wellness Visit  02/01/2025   DTaP/Tdap/Td vaccine (5 - Td or Tdap) 08/12/2033   Pneumonia Vaccine  Completed   Hepatitis C Screening  Completed   HPV Vaccine  Aged Out   Meningitis B Vaccine  Aged Out   Zoster (Shingles) Vaccine  Discontinued    Advanced directives: (Copy Requested) Please bring a copy of your health care power of attorney and living will to the office to be added to your chart at your convenience. You can mail to [facility] [address] [place], [place] [address] or email to [email or web address]  Next Medicare Annual Wellness Visit scheduled for next year: Yes

## 2024-02-02 NOTE — Progress Notes (Cosign Needed Addendum)
 Subjective:   [name] is a 78 y.o. who presents for a Medicare Wellness preventive visit.  Visit Complete: Virtual I connected with  [name] on 02/02/24 by a audio enabled telemedicine application and verified that I am speaking with the correct person using two identifiers.  Patient Location: Home  Provider Location: Home Office  I discussed the limitations of evaluation and management by telemedicine. The patient expressed understanding and agreed to proceed.  Vital Signs: Because this visit was a virtual/telehealth visit, some criteria may be missing or patient reported. Any vitals not documented were not able to be obtained and vitals that have been documented are patient reported.  VideoDeclined- This patient declined Librarian, academic. Therefore the visit was completed with audio only.  Persons Participating in Visit: Patient.  AWV Questionnaire: No: Patient Medicare AWV questionnaire was not completed prior to this visit.  Cardiac Risk Factors include: advanced age (>60men, >59 women)     Objective:    Today's Vitals   02/02/24 0856 02/02/24 0907  Weight: 245 lb (111.1 kg)   Height: 6\' 3"  (1.905 m)   PainSc:  0-No pain   Body mass index is 30.62 kg/m.     02/02/2024    9:12 AM 11/21/2023    8:48 AM 07/05/2023    9:57 AM 06/16/2023    9:37 AM 05/10/2023   11:41 AM 04/19/2023   11:53 AM 02/01/2023   10:00 AM  Advanced Directives  Does Patient Have a Medical Advance Directive? Yes No Yes Yes Yes Yes No  Type of Estate agent of [place];Living will    Living will    Does patient want to make changes to medical advance directive?       No - Patient declined  Copy of Healthcare Power of Attorney in Chart? No - copy requested        Would patient like information on creating a medical advance directive?       No - Patient declined    Current Medications (verified) Outpatient Encounter Medications as of  02/02/2024  Medication Sig   acetaminophen  (TYLENOL ) 325 MG tablet Take 2 tablets (650 mg total) by mouth every 6 (six) hours as needed for mild pain (or Fever >/= 101).   acyclovir  (ZOVIRAX ) 400 MG tablet Take 1 tablet (400 mg total) by mouth 2 (two) times daily.   albuterol  (VENTOLIN  HFA) 108 (90 Base) MCG/ACT inhaler Inhale 2 puffs into the lungs every 6 (six) hours as needed for wheezing or shortness of breath.   alfuzosin  (UROXATRAL ) 10 MG 24 hr tablet TAKE 1 TABLET AT BEDTIME   allopurinol  (ZYLOPRIM ) 100 MG tablet TAKE 1 TABLET EVERY DAY   ARNUITY ELLIPTA 100 MCG/ACT AEPB Inhale 1 puff into the lungs daily.  on any day albuterol  is required for 30 days   atorvastatin  (LIPITOR ) 80 MG tablet TAKE 1 TABLET EVERY DAY   augmented betamethasone dipropionate (DIPROLENE-AF) 0.05 % cream SMARTSIG:sparingly Topical Twice Daily   buPROPion  (WELLBUTRIN  XL) 300 MG 24 hr tablet TAKE 1 TABLET EVERY DAY WITH BREAKFAST   Calcium  Citrate-Vitamin D  (CALCIUM  CITRATE + D PO) Take 1 tablet by mouth 2 (two) times daily. 1200mg  of Calcium  and 1000 units vitamin D3   cephALEXin  (KEFLEX ) 500 MG capsule Take 1 capsule (500 mg total) by mouth 4 (four) times daily.   Cholecalciferol  (VITAMIN D ) 50 MCG (2000 UT) tablet Take 4,000 Units by mouth in the morning and at bedtime.   clonazePAM  (  KLONOPIN ) 1 MG tablet TAKE 1 TABLET THREE TIMES DAILY AS NEEDED FOR ANXIETY   CRANBERRY PO Take 1 tablet by mouth in the morning and at bedtime.   denosumab  (PROLIA ) 60 MG/ML SOSY injection Inject 60 mg as directed every 6 (six) months.   DULoxetine  (CYMBALTA ) 60 MG capsule TAKE 1 CAPSULE TWICE DAILY   EPINEPHrine  0.3 mg/0.3 mL IJ SOAJ injection Inject 0.3 mg into the muscle once as needed for anaphylaxis (severe allergic reaction).   ezetimibe  (ZETIA ) 10 MG tablet TAKE 1 TABLET EVERY DAY   feeding supplement (ENSURE ENLIVE / ENSURE PLUS) LIQD Take 237 mLs by mouth 2 (two) times daily between meals. (Patient taking differently: Take 237  mLs by mouth daily.)   ferrous sulfate  325 (65 FE) MG tablet Take 325 mg by mouth 2 (two) times daily with a meal.   finasteride (PROSCAR) 5 MG tablet Take 5 mg by mouth daily.   HYDROcodone -acetaminophen  (NORCO/VICODIN) 5-325 MG tablet Take 1 tablet by mouth every 4 (four) hours as needed.   lactobacillus acidophilus (BACID) TABS tablet Take 1 tablet by mouth daily.   levothyroxine  (SYNTHROID ) 125 MCG tablet Take 1 tablet (125 mcg total) by mouth daily at 6 (six) AM.   lidocaine -prilocaine  (EMLA ) cream Apply 1 Application topically as needed. (Patient taking differently: Apply 1 Application topically as needed (For port access/chemotherapy).)   loratadine  (CLARITIN ) 10 MG tablet Take 10 mg by mouth daily.   Magnesium  250 MG TABS Take 250 mg by mouth daily.   metoprolol  tartrate (LOPRESSOR ) 25 MG tablet TAKE 1 TABLET TWICE DAILY   Multiple Vitamin (MULTIVITAMIN WITH MINERALS) TABS tablet Take 1 tablet by mouth every morning. Men's One-A-Day 50+   MYRBETRIQ  25 MG TB24 tablet TAKE 1 TABLET EVERY DAY (Patient taking differently: Take 25 mg by mouth at bedtime.)   nitroGLYCERIN  (NITROSTAT ) 0.4 MG SL tablet Place 0.4 mg under the tongue every 5 (five) minutes as needed for chest pain.   omeprazole  (PRILOSEC) 40 MG capsule TAKE 1 CAPSULE EVERY DAY BEFORE BREAKFAST   Potassium Citrate 15 MEQ (1620 MG) TBCR Take 1 tablet by mouth in the morning and at bedtime.   Probiotic Product (PROBIOTIC PO) Take 1 capsule by mouth daily with breakfast.   Risankizumab-rzaa (SKYRIZI [place]) Inject into the skin. On hold   SKYRIZI PEN 150 MG/ML pen    solifenacin (VESICARE) 10 MG tablet Take 10 mg by mouth daily.   Specialty Vitamins Products (MG PLUS PROTEIN) 133 MG TABS Take 1 tablet by mouth 3 (three) times daily.   tamsulosin  (FLOMAX ) 0.4 MG CAPS capsule Take 1 capsule by mouth daily.   testosterone  enanthate (DELATESTRYL ) 200 MG/ML injection INJECT (200MG ) INTO THE MUSCLE EVERY 14 DAYS. DISCARD VIAL AFTER 28 DAYS.  (Patient taking differently: Inject 200 mg into the muscle every 14 (fourteen) days. Every other Friday)   vitamin C  (ASCORBIC ACID ) 500 MG tablet Take 500 mg by mouth daily with lunch.   No facility-administered encounter medications on file as of 02/02/2024.    Allergies (verified) Short ragweed pollen ext   History: Past Medical History:  Diagnosis Date   Benign localized prostatic hyperplasia with lower urinary tract symptoms (LUTS)    CAD (coronary artery disease) cardiologist--  dr t. Theodis Fiscal   hx of known CAD obstruction w/ collaterals (cath done @ [facility] 12-31-2011) ;   last cardiac cath 08-13-2019  showed sig. 2V CAD involving proxLAD and CTO of the RCA/  chronic total occlusion midRCA w/ bridging and L>R collaterals  Diastolic CHF (HCC)    dx 06-15-2019 hospital admission (followed by pcp)   Diffuse large B cell lymphoma [facility]) oncologist-- dr Salomon Cree--- in remission   dx 08/ 2019 -- bx left tonsill mass-- involving lymph nodes--- completed chemotherapy 10-12-2018   DJD (degenerative joint disease)    Dysthymic disorder    Environmental allergies    GAD (generalized anxiety disorder)    with dysthymia   GERD (gastroesophageal reflux disease)    History of falling    multiple times   History of kidney stones    HLD (hyperlipidemia)    Hyperlipidemia    Hypertension    Hypogonadism in male    OA (osteoarthritis)    OSA on CPAP    cpap machine-settings 17   Osteoporosis with fracture 04/24/2013   Persistent atrial fibrillation (HCC)    On Xarelto , cardiologist--- dr Ardeth Beckers. [place]   Plaque psoriasis    dermatologist--- dr Gaynelle Keeling--- currently taking otezla    Post-surgical hypothyroidism    followed by pcp---  s/p total thyroidectomy for graves disease in 1987   Wears hearing aid in both ears    Past Surgical History:  Procedure Laterality Date   APPENDECTOMY  2011 approx.    CARDIAC CATHETERIZATION  12-31-2011   dr j. Kern Pedro @[facility]    normal LVF w/ multivessel  CAD,  occluded RCA w/ colleterals   CATARACT EXTRACTION W/ INTRAOCULAR LENS  IMPLANT, BILATERAL  2016 approx.   COLONOSCOPY     CORONARY PRESSURE/FFR STUDY N/A 08/13/2019   Procedure: INTRAVASCULAR PRESSURE WIRE/FFR STUDY;  Surgeon: Sammy Crisp, MD;  [facility];  Service: Cardiovascular;  Laterality: N/A;   CYSTOSCOPY WITH RETROGRADE PYELOGRAM, URETEROSCOPY AND STENT PLACEMENT Left 11/07/2017   Procedure: CYSTOSCOPY WITH RETROGRADE PYELOGRAM, URETEROSCOPY AND STENT PLACEMENT;  Surgeon: Marco Severs, MD;  Location: [facility];  Service: Urology;  Laterality: Left;   CYSTOSCOPY WITH RETROGRADE PYELOGRAM, URETEROSCOPY AND STENT PLACEMENT Right 10/22/2019   Procedure: CYSTOSCOPY WITH RETROGRADE PYELOGRAM, URETEROSCOPY AND STENT PLACEMENT;  Surgeon: Marco Severs, MD;  Location: [facility];  Service: Urology;  Laterality: Right;   CYSTOSCOPY/URETEROSCOPY/HOLMIUM LASER/STENT PLACEMENT Right 03/11/2017   Procedure: CYSTOSCOPY/URETEROSCOPYSTENT PLACEMENT right ureter retrograde pylegram;  Surgeon: Marco Severs, MD;  [facility]: [facility];  Service: Urology;  Laterality: Right;   HARDWARE REMOVAL  10/05/2012   Procedure: HARDWARE REMOVAL;  Surgeon: Bevin Bucks, MD;  [facility]: [facility];  Service: Orthopedics;  Laterality: Right;  REMOVING  STRYKER  GAMMA NAIL   HARDWARE REMOVAL Right 07/03/2013   Procedure: HARDWARE REMOVAL RIGHT TIBIA ;  Surgeon: Arlette Lagos, MD;  Location: [facility];  Service: Orthopedics;  Laterality: Right;   HIP CLOSED REDUCTION Right 10/15/2013   Procedure: CLOSED MANIPULATION HIP;  Surgeon: Bevin Bucks, MD;  [facility]: [facility];  Service: Orthopedics;  Laterality: Right;   HOLMIUM LASER APPLICATION Right 02/08/2017   Procedure: HOLMIUM LASER APPLICATION;  Surgeon: Marco Severs, MD;  [facility];  Service: Urology;  Laterality: Right;   HOLMIUM LASER APPLICATION Left 03/26/2019   Procedure: HOLMIUM LASER APPLICATION;  Surgeon:  Marco Severs, MD;  [facility];  Service: Urology;  Laterality: Left;   HOLMIUM LASER APPLICATION Left 04/17/2019   Procedure: HOLMIUM LASER APPLICATION;  Surgeon: Marco Severs, MD;  [facility];  Service: Urology;  Laterality: Left;   HOLMIUM LASER APPLICATION Right 10/22/2019   Procedure: HOLMIUM LASER APPLICATION;  Surgeon: Marco Severs, MD;  Location: [facility];  Service: Urology;  Laterality: Right;   INCISION AND DRAINAGE HIP Right 11/16/2013   Procedure: IRRIGATION AND DEBRIDEMENT RIGHT HIP;  Surgeon: Bevin Bucks, MD;  [facility];  Service: Orthopedics;  Laterality: Right;   IR IMAGING GUIDED PORT INSERTION  06/22/2018   IR NEPHROSTOMY PLACEMENT LEFT  03/28/2019   IR URETERAL STENT LEFT NEW ACCESS W/O SEP NEPHROSTOMY CATH  10/24/2017   IR URETERAL STENT LEFT NEW ACCESS W/O SEP NEPHROSTOMY CATH  03/26/2019   NEPHROLITHOTOMY Right 02/08/2017   Procedure: NEPHROLITHOTOMY PERCUTANEOUS WITH SURGEON ACCESS;  Surgeon: Marco Severs, MD;  [facility];  Service: Urology;  Laterality: Right;   NEPHROLITHOTOMY Left 10/24/2017   Procedure: NEPHROLITHOTOMY PERCUTANEOUS;  Surgeon: Marco Severs, MD;  [facility];  Service: Urology;  Laterality: Left;   NEPHROLITHOTOMY Left 03/26/2019   Procedure: NEPHROLITHOTOMY PERCUTANEOUS;  Surgeon: Marco Severs, MD;  [facility];  Service: Urology;  Laterality: Left;  2 HRS   NEPHROLITHOTOMY Left 04/17/2019   Procedure: NEPHROLITHOTOMY PERCUTANEOUS;  Surgeon: Marco Severs, MD;  [facility];  Service: Urology;  Laterality: Left;  2 HRS   ORIF TIBIA FRACTURE Right 02/06/2013   Procedure: OPEN REDUCTION INTERNAL FIXATION (ORIF) TIBIA FRACTURE WITH IM ROD FIBULA;  Surgeon: Arlette Lagos, MD;  [facility];  Service: Orthopedics;  Laterality: Right;   ORIF TIBIA FRACTURE Right 07/03/2013   Procedure: RIGHT TIBIA NON UNION REPAIR ;  Surgeon: Arlette Lagos, MD;  Location[facility];   Service: Orthopedics;  Laterality: Right;   ORIF WRIST FRACTURE  10/02/2012   Procedure: OPEN REDUCTION INTERNAL FIXATION (ORIF) WRIST FRACTURE;  Surgeon: Ronn Cohn, MD;  [facility];  Service: Orthopedics;  Laterality: Right;  WITH   ANTIBIOTIC  CEMENT   ORIF WRIST FRACTURE Left 10/28/2013   Procedure: OPEN REDUCTION INTERNAL FIXATION (ORIF) WRIST FRACTURE with allograft;  Surgeon: Ronn Cohn, MD;  [facility];  Service: Orthopedics;  Laterality: Left;  DVR Plate   QUADRICEPS TENDON REPAIR Left 07/15/2017   Procedure: REPAIR QUADRICEP TENDON;  Surgeon: [name] Hamburger, MD;  Location: [facility] OR;  Service: Orthopedics;  Laterality: Left;   RIGHT/LEFT HEART CATH AND CORONARY ANGIOGRAPHY N/A 08/13/2019   Procedure: RIGHT/LEFT HEART CATH AND CORONARY ANGIOGRAPHY;  Surgeon: Sammy Crisp, MD;  [facility];  Service: Cardiovascular;  Laterality: N/A;   THYROIDECTOMY  02/1986   TOTAL HIP ARTHROPLASTY Right 03-16-2016   @[facility]    TOTAL HIP REVISION  10/05/2012   Procedure: TOTAL HIP REVISION;  Surgeon: Bevin Bucks, MD;  [facility];  Service: Orthopedics;  Laterality: Right;  RIGHT TOTAL HIP REVISION   TOTAL HIP REVISION Right 09/17/2013   Procedure: REVISION RIGHT TOTAL HIP ARTHROPLASTY ;  Surgeon: Bevin Bucks, MD;  [facility];  Service: Orthopedics;  Laterality: Right;   TOTAL HIP REVISION Right 10/26/2013   Procedure: REVISION RIGHT TOTAL HIP ARTHROPLASTY;  Surgeon: Bevin Bucks, MD;  [facility];  Service: Orthopedics;  Laterality: Right;   TOTAL KNEE ARTHROPLASTY Bilateral right 03-15-2011;  left 06-30-2011   TOTAL KNEE REVISION Left 04/11/2017   Procedure: TOTAL KNEE REVISION PATELLA and TIBIA;  Surgeon: [name] Hamburger, MD;  [facility];  Service: Orthopedics;  Laterality: Left;   Family History  Problem Relation Age of Onset   CAD Father 31   Asthma Father    Alcohol  abuse Father    Arthritis Mother    Alcohol  abuse Sister    Social  History   Socioeconomic History   Marital status: Married    Spouse name: Bambi Lever   Number of children: Not on file   Years of education: Not on file   Highest education level: Not on file  Occupational History   Occupation: RETIRED  Tobacco Use   Smoking status: Former    Current packs/day: 0.00    Average packs/day: 1 pack/day for 50.0 years (50.0 ttl pk-yrs)    Types: Cigarettes    Start date: 01/11/1962    Quit date: 01/12/2012    Years since quitting: 12.0   Smokeless tobacco: Never  Vaping Use   Vaping status: Never Used  Substance and Sexual Activity   Alcohol  use: Yes    Comment: rarely   Drug use: Never   Sexual activity: Yes  Other Topics Concern   Not on file  Social History Narrative   Lives In home with spouse Bambi Lever      Social Drivers of Health   Financial Resource Strain: Low Risk  (02/02/2024)   Overall Financial Resource Strain (CARDIA)    Difficulty of Paying Living Expenses: Not hard at all  Food Insecurity: No Food Insecurity (02/02/2024)   Hunger Vital Sign    Worried About Running Out of Food in the Last Year: Never true    Ran Out of Food in the Last Year: Never true  Transportation Needs: No Transportation Needs (02/02/2024)   PRAPARE - Administrator, Civil Service (Medical): No    Lack of Transportation (Non-Medical): No  Physical Activity: Inactive (02/02/2024)   Exercise Vital Sign    Days of Exercise per Week: 0 days    Minutes of Exercise per Session: 0 min  Stress: No Stress Concern Present (02/02/2024)   [employer] of Occupational Health - Occupational Stress Questionnaire    Feeling of Stress : Not at all  Social Connections: Moderately Integrated (02/02/2024)   Social Connection and Isolation Panel [NHANES]    Frequency of Communication with Friends and Family: Once a week    Frequency of Social Gatherings with Friends and Family: Once a week    Attends Religious Services: More than 4 times per year    Active Member  of [employer] or Organizations: Yes    Attends Banker Meetings: Never    Marital Status: Married    Tobacco Counseling Counseling given: Not Answered    Clinical Intake:  Pre-visit preparation completed: Yes  Pain : No/denies pain Pain Score: 0-No pain     BMI - recorded: 30.62 Nutritional Risks: None Diabetes: No  Lab Results  Component Value Date   HGBA1C 5.8 (H) 02/06/2013   HGBA1C (H) 03/15/2011    6.4 (NOTE)                                                                       According to the ADA Clinical Practice Recommendations for 2011, when HbA1c is used as a screening test:   >=6.5%   Diagnostic of Diabetes Mellitus           (if abnormal result  is confirmed)  5.7-6.4%   Increased risk of developing Diabetes Mellitus  References:Diagnosis and Classification of Diabetes Mellitus,Diabetes Care,2011,34(Suppl 1):S62-S69 and Standards of Medical Care  in         Diabetes - 2011,Diabetes Care,2011,34  (Suppl 1):S11-S61.   HGBA1C (H) 03/12/2011    6.1 (NOTE)                                                                       According to the ADA Clinical Practice Recommendations for 2011, when HbA1c is used as a screening test:   >=6.5%   Diagnostic of Diabetes Mellitus           (if abnormal result  is confirmed)  5.7-6.4%   Increased risk of developing Diabetes Mellitus  References:Diagnosis and Classification of Diabetes Mellitus,Diabetes Care,2011,34(Suppl 1):S62-S69 and Standards of Medical Care in         Diabetes - 2011,Diabetes Care,2011,34  (Suppl 1):S11-S61.     How often do you need to have someone help you when you read instructions, pamphlets, or other written materials from your doctor or pharmacy?: 1 - Never  Interpreter Needed?: No  Information entered by :: Warda Mcqueary, RMA   Activities of Daily Living     02/02/2024    9:08 AM  In your present state of health, do you have any difficulty performing the following activities:  Hearing?  1  Comment wears hearing aides  Vision? 0  Difficulty concentrating or making decisions? 0  Walking or climbing stairs? 0  Dressing or bathing? 0  Doing errands, shopping? 0  Preparing Food and eating ? N  Using the Toilet? N  In the past six months, have you accidently leaked urine? N  Do you have problems with loss of bowel control? N  Managing your Medications? N  Managing your Finances? N  Housekeeping or managing your Housekeeping? N    Patient Care Team: Plotnikov, Oakley Bellman, MD as PCP - General (Internal Medicine) Maudine Sos, MD as PCP - Cardiology (Cardiology) Frankie Israel, MD as Consulting Physician (Hematology) McKenzie, Arden Beck, MD as Consulting Physician (Urology) Lajuan Pila, MD as Consulting Physician (Gastroenterology) Maudine Sos, MD as Attending Physician (Cardiology) Murtis Arthur, OD as Consulting Physician (Optometry) Szabat, Tino Foreman, [facility] (Inactive) as Pharmacist (Pharmacist) Gaynelle Keeling, MD as Consulting Physician (Dermatology) Krishnamurthy, Sarada, MD (Hematology and Oncology) Clearnce Curia, NP as Nurse Practitioner (Cardiology)  Indicate any recent Medical Services you may have received from other than [facility] providers in the past year (date may be approximate).     Assessment:   This is a routine wellness examination for [place].  Hearing/Vision screen Hearing Screening - Comments:: Wears hearing aides Vision Screening - Comments:: Cataract surgery   Goals Addressed   None    Depression Screen     02/02/2024    9:16 AM 11/29/2023   11:08 AM 02/01/2023    8:36 AM 01/11/2023    5:37 PM 11/23/2022   11:36 AM 08/23/2022   11:38 AM 06/30/2022    1:53 PM  PHQ 2/9 Scores  PHQ - 2 Score 0 0 0 0 0 0 0  PHQ- 9 Score 0     0     Fall Risk     02/02/2024    9:12 AM 11/29/2023   11:08 AM 02/01/2023    8:29 AM 01/31/2023    9:14 AM 01/11/2023  5:36 PM  Fall Risk   Falls in the past year? 1 1 1 1 1   Number falls in  past yr:  1 1 1 1   Injury with Fall? 0 1 1 1 1   Comment   scrape on elbow    Risk for fall due to :  History of fall(s);Impaired balance/gait History of fall(s)  History of fall(s);Impaired balance/gait;Impaired mobility  Follow up Falls evaluation completed;Falls prevention discussed Falls evaluation completed   Falls prevention discussed;Education provided;Falls evaluation completed    MEDICARE RISK AT HOME:  Medicare Risk at Home Any stairs in or around the home?: Yes (chair lift) If so, are there any without handrails?: Yes Home free of loose throw rugs in walkways, pet beds, electrical cords, etc?: Yes Adequate lighting in your home to reduce risk of falls?: Yes Life alert?: No Use of a cane, walker or w/c?: Yes (walker) Grab bars in the bathroom?: Yes Shower chair or bench in shower?: Yes Elevated toilet seat or a handicapped toilet?: Yes  TIMED UP AND GO:  Was the test performed?  No  Cognitive Function: 6CIT completed        02/02/2024    9:13 AM 02/04/2023   10:54 AM 01/11/2023    5:39 PM  6CIT Screen  What Year? 0 points 0 points 0 points  What month? 0 points 0 points 0 points  What time? 0 points 0 points 0 points  Count back from 20 0 points 0 points 0 points  Months in reverse 0 points 0 points 0 points  Repeat phrase 0 points 0 points 0 points  Total Score 0 points 0 points 0 points    Immunizations Immunization History  Administered Date(s) Administered   Fluad Quad(high Dose 65+) 06/26/2020, 07/08/2021   Influenza Split 07/05/2013   Influenza, High Dose Seasonal PF 07/12/2015, 07/29/2015, 04/28/2017, 08/16/2017, 09/06/2018   Influenza,inj,Quad PF,6+ Mos 07/05/2013, 07/17/2018   Influenza-Unspecified 07/07/2022, 08/01/2023, 08/01/2023   Moderna Covid-19 Fall Seasonal Vaccine 16yrs & older 02/01/2023   Moderna Covid-19 Vaccine Bivalent Booster 73yrs & up 08/01/2023   PFIZER(Purple Top)SARS-COV-2 Vaccination 12/04/2019, 12/25/2019, 06/05/2020, 01/23/2021,  08/06/2021, 07/01/2022   PNEUMOCOCCAL CONJUGATE-20 08/01/2023   PPD Test 03/19/2016, 11/05/2016, 11/18/2016   Pneumococcal Conjugate-13 12/03/2015   Pneumococcal Polysaccharide-23 10/11/2009, 08/07/2015, 04/28/2017, 08/16/2017   Respiratory Syncytial Virus Vaccine,Recomb Aduvanted(Arexvy) 11/18/2022   Td 10/11/2012   Td (Adult),5 Lf Tetanus Toxid, Preservative Free 10/11/2012   Tdap 06/17/2022, 08/13/2023    Screening Tests Health Maintenance  Topic Date Due   Lung Cancer Screening  04/23/2023   COVID-19 Vaccine (9 - 2024-25 season) 09/26/2023   INFLUENZA VACCINE  05/11/2024   Medicare Annual Wellness (AWV)  02/01/2025   DTaP/Tdap/Td (5 - Td or Tdap) 08/12/2033   Pneumonia Vaccine 49+ Years old  Completed   Hepatitis C Screening  Completed   HPV VACCINES  Aged Out   Meningococcal B Vaccine  Aged Out   Zoster Vaccines- Shingrix  Discontinued    Health Maintenance  Health Maintenance Due  Topic Date Due   Lung Cancer Screening  04/23/2023   COVID-19 Vaccine (9 - 2024-25 season) 09/26/2023   Health Maintenance Items Addressed: See Nurse Notes  Additional Screening:  Vision Screening: Recommended annual ophthalmology exams for early detection of glaucoma and other disorders of the eye.  Dental Screening: Recommended annual dental exams for proper oral hygiene  Community Resource Referral / Chronic Care Management: CRR required this visit?  No   CCM required this visit?  No  Plan:     I have personally reviewed and noted the following in the patient's chart:   Medical and social history Use of alcohol , tobacco or illicit drugs  Current medications and supplements including opioid prescriptions. Patient is not currently taking opioid prescriptions. Functional ability and status Nutritional status Physical activity Advanced directives List of other physicians Hospitalizations, surgeries, and ER visits in previous 12 months Vitals Screenings to include  cognitive, depression, and falls Referrals and appointments  In addition, I have reviewed and discussed with patient certain preventive protocols, quality metrics, and best practice recommendations. A written personalized care plan for preventive services as well as general preventive health recommendations were provided to patient.     Walker Paddack L Kein Carlberg, CMA   02/02/2024   After Visit Summary: (MyChart) Due to this being a telephonic visit, the after visit summary with patients personalized plan was offered to patient via MyChart   Notes: Please refer to Routing Comments.  Medical screening examination/treatment/procedure(s) were performed by non-physician practitioner and as supervising physician I was immediately available for consultation/collaboration.  I agree with above. Adelaide Holy, MD

## 2024-02-03 ENCOUNTER — Other Ambulatory Visit: Payer: Self-pay

## 2024-02-06 DIAGNOSIS — F410 Panic disorder [episodic paroxysmal anxiety] without agoraphobia: Secondary | ICD-10-CM | POA: Diagnosis not present

## 2024-02-06 DIAGNOSIS — F419 Anxiety disorder, unspecified: Secondary | ICD-10-CM | POA: Diagnosis not present

## 2024-02-06 DIAGNOSIS — F331 Major depressive disorder, recurrent, moderate: Secondary | ICD-10-CM | POA: Diagnosis not present

## 2024-02-07 ENCOUNTER — Emergency Department (HOSPITAL_BASED_OUTPATIENT_CLINIC_OR_DEPARTMENT_OTHER)
Admission: EM | Admit: 2024-02-07 | Discharge: 2024-02-07 | Disposition: A | Discharge status: Home / Self Care | Attending: Emergency Medicine | Admitting: Emergency Medicine

## 2024-02-07 ENCOUNTER — Other Ambulatory Visit: Payer: Self-pay

## 2024-02-07 ENCOUNTER — Encounter (HOSPITAL_BASED_OUTPATIENT_CLINIC_OR_DEPARTMENT_OTHER): Payer: Self-pay | Admitting: Emergency Medicine

## 2024-02-07 DIAGNOSIS — R300 Dysuria: Secondary | ICD-10-CM | POA: Diagnosis not present

## 2024-02-07 LAB — URINALYSIS, W/ REFLEX TO CULTURE (INFECTION SUSPECTED)
Bacteria, UA: NONE SEEN
Bilirubin Urine: NEGATIVE
Glucose, UA: NEGATIVE mg/dL
Ketones, ur: NEGATIVE mg/dL
Nitrite: NEGATIVE
Protein, ur: 30 mg/dL — AB
RBC / HPF: >50 RBC/hpf (ref 0–5)
Specific Gravity, Urine: 1.017 (ref 1.005–1.030)
WBC, UA: >50 WBC/hpf (ref 0–5)
pH: 8 (ref 5.0–8.0)

## 2024-02-07 MED ORDER — CEPHALEXIN 500 MG PO CAPS: 500.0000 mg | ORAL_CAPSULE | Freq: Four times a day (QID) | ORAL | 0 refills | Status: DC

## 2024-02-07 MED ORDER — CEPHALEXIN 250 MG PO CAPS
500.0000 mg | ORAL_CAPSULE | Freq: Once | ORAL | Status: AC
  Administered 2024-02-07: 500 mg via ORAL
  Filled 2024-02-07: qty 2

## 2024-02-07 NOTE — ED Provider Notes (Signed)
 [place] EMERGENCY DEPARTMENT AT [facility] Provider Note   CSN: [id number] Arrival date & time: 02/07/24  0848     History  Chief Complaint  Patient presents with   Dysuria    [name] is a 78 y.o. male.  Patient to ED with dysuria like "chronic" UTI x 1 week. No fever, nausea, vomiting. No abdominal pain.   The history is provided by the patient. No language interpreter was used.  Dysuria Presenting symptoms: dysuria        Home Medications Prior to Admission medications   Medication Sig Start Date End Date Taking? Authorizing Provider  acetaminophen  (TYLENOL ) 325 MG tablet Take 2 tablets (650 mg total) by mouth every 6 (six) hours as needed for mild pain (or Fever >/= 101). 03/25/22   Aura Leeds Latif, DO  acyclovir  (ZOVIRAX ) 400 MG tablet Take 1 tablet (400 mg total) by mouth 2 (two) times daily. 09/07/23   Maudine Sos, MD  albuterol  (VENTOLIN  HFA) [phone] Base) MCG/ACT inhaler Inhale 2 puffs into the lungs every 6 (six) hours as needed for wheezing or shortness of breath.    [provider]  alfuzosin  (UROXATRAL ) 10 MG 24 hr tablet TAKE 1 TABLET AT BEDTIME 08/16/23   McKenzie, Arden Beck, MD  allopurinol  (ZYLOPRIM ) 100 MG tablet TAKE 1 TABLET EVERY DAY 01/31/24   Plotnikov, Aleksei V, MD  ARNUITY ELLIPTA 100 MCG/ACT AEPB Inhale 1 puff into the lungs daily.  on any day albuterol  is required for 30 days 05/25/23   [provider]  atorvastatin  (LIPITOR ) 80 MG tablet TAKE 1 TABLET EVERY DAY 08/15/23   Walker, Caitlin S, NP  augmented betamethasone dipropionate (DIPROLENE-AF) 0.05 % cream SMARTSIG:sparingly Topical Twice Daily 11/11/23   [provider]  buPROPion  (WELLBUTRIN  XL) 300 MG 24 hr tablet TAKE 1 TABLET EVERY DAY WITH BREAKFAST 12/19/23   Plotnikov, Aleksei V, MD  Calcium  Citrate-Vitamin D  (CALCIUM  CITRATE + D PO) Take 1 tablet by mouth 2 (two) times daily. 1200mg  of Calcium  and 1000 units vitamin D3    [provider]  cephALEXin  (KEFLEX ) 500 MG capsule Take 1 capsule (500 mg total) by mouth 4 (four) times daily. 02/07/24   Mandy Second, PA-C  Cholecalciferol  (VITAMIN D ) 50 MCG (2000 UT) tablet Take 4,000 Units by mouth in the morning and at bedtime.    [provider]  clonazePAM  (KLONOPIN ) 1 MG tablet TAKE 1 TABLET THREE TIMES DAILY AS NEEDED FOR ANXIETY 12/19/23   Plotnikov, Aleksei V, MD  CRANBERRY PO Take 1 tablet by mouth in the morning and at bedtime.    [provider]  denosumab  (PROLIA ) 60 MG/ML SOSY injection Inject 60 mg as directed every 6 (six) months. 12/10/15   [provider]  DULoxetine  (CYMBALTA ) 60 MG capsule TAKE 1 CAPSULE TWICE DAILY 01/16/24   Plotnikov, Aleksei V, MD  EPINEPHrine  0.3 mg/0.3 mL IJ SOAJ injection Inject 0.3 mg into the muscle once as needed for anaphylaxis (severe allergic reaction). 07/31/21   [provider]  ezetimibe  (ZETIA ) 10 MG tablet TAKE 1 TABLET EVERY DAY 08/25/23   Maudine Sos, MD  feeding supplement (ENSURE ENLIVE / ENSURE PLUS) LIQD Take 237 mLs by mouth 2 (two) times daily between meals. Patient taking differently: Take 237 mLs by mouth daily. 03/25/22   Aura Leeds Latif, DO  ferrous sulfate  325 (65 FE) MG tablet Take 325 mg by mouth 2 (two) times daily with a meal.    [provider]  finasteride (  PROSCAR) 5 MG tablet Take 5 mg by mouth daily. 09/25/23   [provider]  HYDROcodone -acetaminophen  (NORCO/VICODIN) 5-325 MG tablet Take 1 tablet by mouth every 4 (four) hours as needed. 03/17/23   [provider]  lactobacillus acidophilus (BACID) TABS tablet Take 1 tablet by mouth daily.    [provider]  levothyroxine  (SYNTHROID ) 125 MCG tablet Take 1 tablet (125 mcg total) by mouth daily at 6 (six) AM. 10/28/23   Plotnikov, Aleksei V, MD  lidocaine -prilocaine  (EMLA ) cream Apply 1 Application topically as needed. Patient taking differently: Apply 1 Application topically as  needed (For port access/chemotherapy). 04/21/22   Kale, Gautam Kishore, MD  loratadine  (CLARITIN ) 10 MG tablet Take 10 mg by mouth daily.    [provider]  Magnesium  250 MG TABS Take 250 mg by mouth daily.    [provider]  metoprolol  tartrate (LOPRESSOR ) 25 MG tablet TAKE 1 TABLET TWICE DAILY 07/18/23   Walker, Caitlin S, NP  Multiple Vitamin (MULTIVITAMIN WITH MINERALS) TABS tablet Take 1 tablet by mouth every morning. Men's One-A-Day 50+    [provider]  MYRBETRIQ  25 MG TB24 tablet TAKE 1 TABLET EVERY DAY Patient taking differently: Take 25 mg by mouth at bedtime. 10/06/21   McKenzie, Arden Beck, MD  nitroGLYCERIN  (NITROSTAT ) 0.4 MG SL tablet Place 0.4 mg under the tongue every 5 (five) minutes as needed for chest pain.    [provider]  omeprazole  (PRILOSEC) 40 MG capsule TAKE 1 CAPSULE EVERY DAY BEFORE BREAKFAST 01/16/24   Plotnikov, Aleksei V, MD  Potassium Citrate 15 MEQ (1620 MG) TBCR Take 1 tablet by mouth in the morning and at bedtime. 06/21/23   McKenzie, Arden Beck, MD  Probiotic Product (PROBIOTIC PO) Take 1 capsule by mouth daily with breakfast.    [provider]  Risankizumab-rzaa [facility] [place]) Inject into the skin. On hold    [provider]  SKYRIZI PEN 150 MG/ML pen  09/10/23   [provider]  solifenacin (VESICARE) 10 MG tablet Take 10 mg by mouth daily.    [provider]  Specialty Vitamins Products (MG PLUS PROTEIN) 133 MG TABS Take 1 tablet by mouth 3 (three) times daily. 11/22/22   [provider]  tamsulosin  (FLOMAX ) 0.4 MG CAPS capsule Take 1 capsule by mouth daily.    [provider]  testosterone  enanthate (DELATESTRYL ) 200 MG/ML injection INJECT 1ML (200MG ) INTO THE MUSCLE EVERY 14 DAYS. DISCARD VIAL AFTER 28 DAYS. Patient taking differently: Inject 200 mg into the muscle every 14 (fourteen) days. Every other Friday 09/22/21   Plotnikov, Aleksei V, MD  vitamin C  (ASCORBIC ACID )  500 MG tablet Take 500 mg by mouth daily with lunch.    [provider]      Allergies    Short ragweed pollen ext    Review of Systems   Review of Systems  Genitourinary:  Positive for dysuria.    Physical Exam Updated Vital Signs BP 128/87 (BP Location: Left Arm)   Pulse 76   Temp 98.4 F (36.9 C) (Oral)   Resp 18   SpO2 99%  Physical Exam Constitutional:      Appearance: He is well-developed.  Pulmonary:     Effort: Pulmonary effort is normal.  Abdominal:     General: There is no distension.     Palpations: Abdomen is soft.     Tenderness: There is no abdominal tenderness.  Musculoskeletal:        General: Normal range of  motion.     Cervical back: Normal range of motion.  Skin:    General: Skin is warm and dry.  Neurological:     Mental Status: He is alert and oriented to person, place, and time.     ED Results / Procedures / Treatments   Labs (all labs ordered are listed, but only abnormal results are displayed) Labs Reviewed  URINALYSIS, W/ REFLEX TO CULTURE (INFECTION SUSPECTED) - Abnormal; Notable for the following components:      Result Value   APPearance HAZY (*)    Hgb urine dipstick MODERATE (*)    Protein, ur 30 (*)    Leukocytes,Ua LARGE (*)    All other components within normal limits  URINE CULTURE    EKG None  Radiology No results found.  Procedures Procedures    Medications Ordered in ED Medications  cephALEXin  (KEFLEX ) capsule 500 mg (has no administration in time range)    ED Course/ Medical Decision Making/ A&P                                 Medical Decision Making This patient presents to the ED for concern of dysuria, this involves an extensive number of treatment options, and is a complaint that carries with it a high risk of complications and morbidity.  The differential diagnosis includes kidney stone, cystitis, sepsis   Co morbidities that complicate the patient evaluation  Recurrent UTI   Additional  history obtained:  Additional history and/or information obtained from chart review, notable for Past cultures E. Faecalis, E. Coli. Noted that last 3 cultures were negative (one with 80K colonies)    Lab Tests:  I Ordered, and personally interpreted labs.  The pertinent results include:  UA: >50 WBCs, >50 RBCs, no bacteria.    Imaging Studies ordered:  I ordered imaging studies including n/a I independently visualized and interpreted imaging which showed n/a I agree with the radiologist interpretation   Cardiac Monitoring:  The patient was maintained on a cardiac monitor.  I personally viewed and interpreted the cardiac monitored which showed an underlying rhythm of: n/a   Medicines ordered and prescription drug management:  I ordered medication including n/a  for n/a Reevaluation of the patient after these medicines showed that the patient stayed the same I have reviewed the patients home medicines and have made adjustments as needed   Test Considered:  N/a   Critical Interventions:  N/a   Consultations Obtained:  I requested consultation with the n/a,  and discussed lab and imaging findings as well as pertinent plan - they recommend: n/a   Problem List / ED Course:  Having recurrent symptoms of frequent UTI No fever, no pain, appears well.  UA shows >50 WBC, RBC, no bacteria, Culture pending Will treat based on history Follow up with PCP/urology to review cultures and direct care.    Reevaluation:  After the interventions noted above, I reevaluated the patient and found that they have :stayed the same   Social Determinants of Health:  Married, lives with spouse   Disposition:  After consideration of the diagnostic results and the patients response to treatment, I feel that the patient would benefit from discharge .   Amount and/or Complexity of Data Reviewed Labs: ordered.  Risk Prescription drug management.           Final  Clinical Impression(s) / ED Diagnoses Final diagnoses:  Dysuria    Rx /  DC Orders ED Discharge Orders          Ordered    cephALEXin  (KEFLEX ) 500 MG capsule  4 times daily        02/07/24 1140              Mandy Second, PA-C 02/07/24 1140    Nicklas Barns, MD 02/07/24 1511

## 2024-02-07 NOTE — ED Triage Notes (Signed)
 In for chronic UTI flare up. States was recently taking antibiotics but "only had 1 week course instead of two weeks" and symptoms returned.

## 2024-02-07 NOTE — Discharge Instructions (Signed)
 Follow up with your doctor or Dr. Derrick Fling to review cultures in 2-3 days to further direct care.   Return to the ED if you develop any fever, significant pain or vomiting.

## 2024-02-08 LAB — URINE CULTURE: Culture: NO GROWTH

## 2024-02-16 ENCOUNTER — Other Ambulatory Visit: Payer: Self-pay | Admitting: Urology

## 2024-02-16 DIAGNOSIS — I10 Essential (primary) hypertension: Secondary | ICD-10-CM | POA: Diagnosis not present

## 2024-02-16 DIAGNOSIS — M810 Age-related osteoporosis without current pathological fracture: Secondary | ICD-10-CM | POA: Diagnosis not present

## 2024-02-16 DIAGNOSIS — I4891 Unspecified atrial fibrillation: Secondary | ICD-10-CM | POA: Diagnosis not present

## 2024-02-16 DIAGNOSIS — N1832 Chronic kidney disease, stage 3b: Secondary | ICD-10-CM | POA: Diagnosis not present

## 2024-02-16 DIAGNOSIS — N200 Calculus of kidney: Secondary | ICD-10-CM | POA: Diagnosis not present

## 2024-02-16 DIAGNOSIS — C8330 Diffuse large B-cell lymphoma, unspecified site: Secondary | ICD-10-CM | POA: Diagnosis not present

## 2024-02-20 DIAGNOSIS — B351 Tinea unguium: Secondary | ICD-10-CM | POA: Diagnosis not present

## 2024-02-20 DIAGNOSIS — L97521 Non-pressure chronic ulcer of other part of left foot limited to breakdown of skin: Secondary | ICD-10-CM | POA: Diagnosis not present

## 2024-02-20 DIAGNOSIS — I70203 Unspecified atherosclerosis of native arteries of extremities, bilateral legs: Secondary | ICD-10-CM | POA: Diagnosis not present

## 2024-02-20 DIAGNOSIS — L97511 Non-pressure chronic ulcer of other part of right foot limited to breakdown of skin: Secondary | ICD-10-CM | POA: Diagnosis not present

## 2024-02-20 DIAGNOSIS — L84 Corns and callosities: Secondary | ICD-10-CM | POA: Diagnosis not present

## 2024-02-20 DIAGNOSIS — I872 Venous insufficiency (chronic) (peripheral): Secondary | ICD-10-CM | POA: Diagnosis not present

## 2024-02-22 DIAGNOSIS — C8330 Diffuse large B-cell lymphoma, unspecified site: Secondary | ICD-10-CM | POA: Diagnosis not present

## 2024-02-22 DIAGNOSIS — Z9221 Personal history of antineoplastic chemotherapy: Secondary | ICD-10-CM | POA: Diagnosis not present

## 2024-03-06 DIAGNOSIS — F410 Panic disorder [episodic paroxysmal anxiety] without agoraphobia: Secondary | ICD-10-CM | POA: Diagnosis not present

## 2024-03-06 DIAGNOSIS — F331 Major depressive disorder, recurrent, moderate: Secondary | ICD-10-CM | POA: Diagnosis not present

## 2024-03-13 DIAGNOSIS — H43393 Other vitreous opacities, bilateral: Secondary | ICD-10-CM | POA: Diagnosis not present

## 2024-03-13 DIAGNOSIS — H18413 Arcus senilis, bilateral: Secondary | ICD-10-CM | POA: Diagnosis not present

## 2024-03-13 DIAGNOSIS — Z961 Presence of intraocular lens: Secondary | ICD-10-CM | POA: Diagnosis not present

## 2024-03-13 DIAGNOSIS — H35443 Age-related reticular degeneration of retina, bilateral: Secondary | ICD-10-CM | POA: Diagnosis not present

## 2024-03-13 DIAGNOSIS — H16223 Keratoconjunctivitis sicca, not specified as Sjogren's, bilateral: Secondary | ICD-10-CM | POA: Diagnosis not present

## 2024-03-13 DIAGNOSIS — H524 Presbyopia: Secondary | ICD-10-CM | POA: Diagnosis not present

## 2024-03-19 ENCOUNTER — Ambulatory Visit: Admitting: Internal Medicine

## 2024-03-19 VITALS — BP 100/52 | HR 94 | Temp 97.7°F | Ht 75.0 in

## 2024-03-19 DIAGNOSIS — F419 Anxiety disorder, unspecified: Secondary | ICD-10-CM

## 2024-03-19 DIAGNOSIS — I2510 Atherosclerotic heart disease of native coronary artery without angina pectoris: Secondary | ICD-10-CM

## 2024-03-19 DIAGNOSIS — I4811 Longstanding persistent atrial fibrillation: Secondary | ICD-10-CM

## 2024-03-19 DIAGNOSIS — F339 Major depressive disorder, recurrent, unspecified: Secondary | ICD-10-CM | POA: Diagnosis not present

## 2024-03-19 DIAGNOSIS — E039 Hypothyroidism, unspecified: Secondary | ICD-10-CM

## 2024-03-19 NOTE — Progress Notes (Signed)
 Subjective:  Patient ID: [name], male    DOB: [birth year]  Age: 78 y.o. MRN: [id number]  CC: Medical Management of Chronic Issues (3 Month follow up. Form from [facility])   HPI [name] presents for me to fill out a DMV form F/u on lymphoma, depression, anxiety  Outpatient Medications Prior to Visit  Medication Sig Dispense Refill   acetaminophen  (TYLENOL ) 325 MG tablet Take 2 tablets (650 mg total) by mouth every 6 (six) hours as needed for mild pain (or Fever >/= 101). 20 tablet    acyclovir  (ZOVIRAX ) 400 MG tablet Take 1 tablet (400 mg total) by mouth 2 (two) times daily. 180 tablet 3   albuterol  (VENTOLIN  HFA) 108 (90 Base) MCG/ACT inhaler Inhale 2 puffs into the lungs every 6 (six) hours as needed for wheezing or shortness of breath.     alfuzosin  (UROXATRAL ) 10 MG 24 hr tablet TAKE 1 TABLET AT BEDTIME 90 tablet 3   allopurinol  (ZYLOPRIM ) 100 MG tablet TAKE 1 TABLET EVERY DAY 90 tablet 3   ARNUITY ELLIPTA 100 MCG/ACT AEPB Inhale 1 puff into the lungs daily.  on any day albuterol  is required for 30 days     atorvastatin  (LIPITOR ) 80 MG tablet TAKE 1 TABLET EVERY DAY 90 tablet 3   augmented betamethasone dipropionate (DIPROLENE-AF) 0.05 % cream SMARTSIG:sparingly Topical Twice Daily     buPROPion  (WELLBUTRIN  XL) 300 MG 24 hr tablet TAKE 1 TABLET EVERY DAY WITH BREAKFAST 90 tablet 3   Calcium  Citrate-Vitamin D  (CALCIUM  CITRATE + D PO) Take 1 tablet by mouth 2 (two) times daily. 1200mg  of Calcium  and 1000 units vitamin D3     cephALEXin  (KEFLEX ) 500 MG capsule Take 1 capsule (500 mg total) by mouth 4 (four) times daily. 28 capsule 0   Cholecalciferol  (VITAMIN D ) 50 MCG (2000 UT) tablet Take 4,000 Units by mouth in the morning and at bedtime.     clonazePAM  (KLONOPIN ) 1 MG tablet TAKE 1 TABLET THREE TIMES DAILY AS NEEDED FOR ANXIETY 90 tablet 3   CRANBERRY PO Take 1 tablet by mouth in the morning and at bedtime.     denosumab  (PROLIA ) 60 MG/ML SOSY injection Inject 60 mg as  directed every 6 (six) months.     DULoxetine  (CYMBALTA ) 60 MG capsule TAKE 1 CAPSULE TWICE DAILY 180 capsule 3   EPINEPHrine  0.3 mg/0.3 mL IJ SOAJ injection Inject 0.3 mg into the muscle once as needed for anaphylaxis (severe allergic reaction).     ezetimibe  (ZETIA ) 10 MG tablet TAKE 1 TABLET EVERY DAY 90 tablet 3   feeding supplement (ENSURE ENLIVE / ENSURE PLUS) LIQD Take 237 mLs by mouth 2 (two) times daily between meals. (Patient taking differently: Take 237 mLs by mouth daily.) 237 mL 12   ferrous sulfate  325 (65 FE) MG tablet Take 325 mg by mouth 2 (two) times daily with a meal.     finasteride (PROSCAR) 5 MG tablet Take 5 mg by mouth daily.     HYDROcodone -acetaminophen  (NORCO/VICODIN) 5-325 MG tablet Take 1 tablet by mouth every 4 (four) hours as needed.     lactobacillus acidophilus (BACID) TABS tablet Take 1 tablet by mouth daily.     levothyroxine  (SYNTHROID ) 125 MCG tablet Take 1 tablet (125 mcg total) by mouth daily at 6 (six) AM. 90 tablet 3   lidocaine -prilocaine  (EMLA ) cream Apply 1 Application topically as needed. (Patient taking differently: Apply 1 Application topically as needed (For port access/chemotherapy).) 30 g 0   loratadine  (  CLARITIN ) 10 MG tablet Take 10 mg by mouth daily.     Magnesium  250 MG TABS Take 250 mg by mouth daily.     metoprolol  tartrate (LOPRESSOR ) 25 MG tablet TAKE 1 TABLET TWICE DAILY 180 tablet 3   Multiple Vitamin (MULTIVITAMIN WITH MINERALS) TABS tablet Take 1 tablet by mouth every morning. Men's One-A-Day 50+     MYRBETRIQ  25 MG TB24 tablet TAKE 1 TABLET EVERY DAY (Patient taking differently: Take 25 mg by mouth at bedtime.) 90 tablet 3   nitroGLYCERIN  (NITROSTAT ) 0.4 MG SL tablet Place 0.4 mg under the tongue every 5 (five) minutes as needed for chest pain.     omeprazole  (PRILOSEC) 40 MG capsule TAKE 1 CAPSULE EVERY DAY BEFORE BREAKFAST 90 capsule 3   Potassium Citrate 15 MEQ (1620 MG) TBCR TAKE 1 TABLET IN THE MORNING AND AT BEDTIME 180 tablet 3    Probiotic Product (PROBIOTIC PO) Take 1 capsule by mouth daily with breakfast.     Risankizumab-rzaa (SKYRIZI [place]) Inject into the skin. On hold     SKYRIZI PEN 150 MG/ML pen      solifenacin (VESICARE) 10 MG tablet Take 10 mg by mouth daily.     Specialty Vitamins Products (MG PLUS PROTEIN) 133 MG TABS Take 1 tablet by mouth 3 (three) times daily.     tamsulosin  (FLOMAX ) 0.4 MG CAPS capsule Take 1 capsule by mouth daily.     testosterone  enanthate (DELATESTRYL ) 200 MG/ML injection INJECT (200MG ) INTO THE MUSCLE EVERY 14 DAYS. DISCARD VIAL AFTER 28 DAYS. (Patient taking differently: Inject 200 mg into the muscle every 14 (fourteen) days. Every other Friday) 5 mL 3   vitamin C  (ASCORBIC ACID ) 500 MG tablet Take 500 mg by mouth daily with lunch.     No facility-administered medications prior to visit.    ROS: Review of Systems  Constitutional:  Negative for appetite change, fatigue and unexpected weight change.  HENT:  Negative for congestion, nosebleeds, sneezing, sore throat and trouble swallowing.   Eyes:  Negative for itching and visual disturbance.  Respiratory:  Negative for cough.   Cardiovascular:  Negative for chest pain, palpitations and leg swelling.  Gastrointestinal:  Negative for abdominal distention, blood in stool, diarrhea and nausea.  Genitourinary:  Negative for frequency and hematuria.  Musculoskeletal:  Positive for arthralgias, back pain and gait problem. Negative for joint swelling and neck pain.  Skin:  Negative for rash.  Neurological:  Negative for dizziness, tremors, speech difficulty and weakness.  Psychiatric/Behavioral:  Negative for agitation, dysphoric mood, sleep disturbance and suicidal ideas. The patient is nervous/anxious.     Objective:  BP (!) 100/52   Pulse 94   Temp 97.7 F (36.5 C)   Ht 6' 3 (1.905 m)   SpO2 99%   BMI 30.62 kg/m   BP Readings from Last 3 Encounters:  03/19/24 (!) 100/52  02/07/24 128/87  01/24/24 119/72    Wt  Readings from Last 3 Encounters:  02/02/24 245 lb (111.1 kg)  01/24/24 245 lb (111.1 kg)  12/09/23 250 lb 12.8 oz (113.8 kg)    Physical Exam Constitutional:      General: He is not in acute distress.    Appearance: He is well-developed. He is obese.     Comments: NAD   Eyes:     Conjunctiva/sclera: Conjunctivae normal.     Pupils: Pupils are equal, round, and reactive to light.   Neck:     Thyroid : No thyromegaly.  Vascular: No JVD.   Cardiovascular:     Rate and Rhythm: Normal rate. Rhythm irregular.     Heart sounds: Normal heart sounds. No murmur heard.    No friction rub. No gallop.  Pulmonary:     Effort: Pulmonary effort is normal. No respiratory distress.     Breath sounds: Normal breath sounds. No wheezing or rales.  Chest:     Chest wall: No tenderness.  Abdominal:     General: Bowel sounds are normal. There is no distension.     Palpations: Abdomen is soft. There is no mass.     Tenderness: There is no abdominal tenderness. There is no guarding or rebound.   Musculoskeletal:        General: Tenderness present. Normal range of motion.     Cervical back: Normal range of motion.     Right lower leg: No edema.     Left lower leg: No edema.  Lymphadenopathy:     Cervical: No cervical adenopathy.   Skin:    General: Skin is warm and dry.     Findings: No rash.   Neurological:     Mental Status: He is alert and oriented to person, place, and time.     Cranial Nerves: No cranial nerve deficit.     Motor: No abnormal muscle tone.     Coordination: Coordination normal.     Gait: Gait normal.     Deep Tendon Reflexes: Reflexes are normal and symmetric.   Psychiatric:        Behavior: Behavior normal.        Thought Content: Thought content normal.        Judgment: Judgment normal.   Antalgic gait Lumbosacral spine with pain on range of motion  DMV form was filled out  Lab Results  Component Value Date   WBC 6.6 11/21/2023   HGB 15.1 11/21/2023    HCT 47.0 11/21/2023   PLT 175 11/21/2023   GLUCOSE 95 11/21/2023   CHOL 102 01/28/2023   TRIG 68 01/28/2023   HDL 51 01/28/2023   LDLDIRECT 37 01/28/2023   LDLCALC 36 01/28/2023   ALT 20 11/21/2023   AST 21 11/21/2023   NA 137 11/21/2023   K 4.7 11/21/2023   CL 102 11/21/2023   CREATININE 1.66 (H) 11/21/2023   BUN 27 (H) 11/21/2023   CO2 28 11/21/2023   TSH 14.031 (H) 09/06/2022   PSA 1.08 06/26/2020   INR 1.0 08/13/2023   HGBA1C 5.8 (H) 02/06/2013    No results found.  Assessment & Plan:   Problem List Items Addressed This Visit     Longstanding persistent atrial fibrillation (HCC) - Primary   Cont w/Xarelto  Rate controlled.  Doing well      Hypothyroidism (Chronic)   On levothyroxine  125 mcg a day Labs at [facility]      Anxiety disorder   Clonazepam  1 mg tid prn Doing well.  Stable symptoms are under control  Potential benefits of a long term benzodiazepines  use as well as potential risks  and complications were explained to the patient and were aknowledged.       Depression   On Cymbalta  Doing well.  Stable symptoms are under control      CAD (coronary artery disease)   No angina.  Doing well         No orders of the defined types were placed in this encounter.     Follow-up: Return for a follow-up visit.  Alex  Landyn Lorincz, MD

## 2024-03-26 ENCOUNTER — Encounter: Payer: Self-pay | Admitting: Internal Medicine

## 2024-03-26 NOTE — Assessment & Plan Note (Addendum)
 Cont w/Xarelto  Rate controlled.  Doing well

## 2024-03-26 NOTE — Assessment & Plan Note (Signed)
No angina.  Doing well. 

## 2024-03-26 NOTE — Assessment & Plan Note (Signed)
On levothyroxine 125 mcg a day Labs at [facility]

## 2024-03-26 NOTE — Assessment & Plan Note (Signed)
 Clonazepam  1 mg tid prn Doing well.  Stable symptoms are under control  Potential benefits of a long term benzodiazepines  use as well as potential risks  and complications were explained to the patient and were aknowledged.

## 2024-03-26 NOTE — Assessment & Plan Note (Signed)
 On Cymbalta  Doing well.  Stable symptoms are under control

## 2024-04-02 ENCOUNTER — Other Ambulatory Visit: Payer: Self-pay | Admitting: Urology

## 2024-04-02 DIAGNOSIS — F331 Major depressive disorder, recurrent, moderate: Secondary | ICD-10-CM | POA: Diagnosis not present

## 2024-04-02 DIAGNOSIS — F410 Panic disorder [episodic paroxysmal anxiety] without agoraphobia: Secondary | ICD-10-CM | POA: Diagnosis not present

## 2024-04-04 DIAGNOSIS — I4891 Unspecified atrial fibrillation: Secondary | ICD-10-CM | POA: Diagnosis not present

## 2024-04-04 DIAGNOSIS — Z87891 Personal history of nicotine dependence: Secondary | ICD-10-CM | POA: Diagnosis not present

## 2024-04-04 DIAGNOSIS — C8330 Diffuse large B-cell lymphoma, unspecified site: Secondary | ICD-10-CM | POA: Diagnosis not present

## 2024-04-04 DIAGNOSIS — Z79899 Other long term (current) drug therapy: Secondary | ICD-10-CM | POA: Diagnosis not present

## 2024-04-04 DIAGNOSIS — R933 Abnormal findings on diagnostic imaging of other parts of digestive tract: Secondary | ICD-10-CM | POA: Diagnosis not present

## 2024-04-04 DIAGNOSIS — C8338 Diffuse large B-cell lymphoma, lymph nodes of multiple sites: Secondary | ICD-10-CM | POA: Diagnosis not present

## 2024-04-10 ENCOUNTER — Other Ambulatory Visit: Payer: Self-pay | Admitting: Internal Medicine

## 2024-04-10 DIAGNOSIS — Z471 Aftercare following joint replacement surgery: Secondary | ICD-10-CM | POA: Diagnosis not present

## 2024-04-10 DIAGNOSIS — Z96649 Presence of unspecified artificial hip joint: Secondary | ICD-10-CM | POA: Diagnosis not present

## 2024-04-10 DIAGNOSIS — Z96641 Presence of right artificial hip joint: Secondary | ICD-10-CM | POA: Diagnosis not present

## 2024-04-11 DIAGNOSIS — J3089 Other allergic rhinitis: Secondary | ICD-10-CM | POA: Diagnosis not present

## 2024-04-11 DIAGNOSIS — J301 Allergic rhinitis due to pollen: Secondary | ICD-10-CM | POA: Diagnosis not present

## 2024-04-17 ENCOUNTER — Other Ambulatory Visit (HOSPITAL_BASED_OUTPATIENT_CLINIC_OR_DEPARTMENT_OTHER): Payer: Self-pay | Admitting: Family

## 2024-04-28 ENCOUNTER — Other Ambulatory Visit: Payer: Self-pay | Admitting: Cardiovascular Disease

## 2024-04-28 ENCOUNTER — Other Ambulatory Visit (HOSPITAL_BASED_OUTPATIENT_CLINIC_OR_DEPARTMENT_OTHER): Payer: Self-pay | Admitting: Family

## 2024-04-28 DIAGNOSIS — I2510 Atherosclerotic heart disease of native coronary artery without angina pectoris: Secondary | ICD-10-CM

## 2024-04-28 DIAGNOSIS — E785 Hyperlipidemia, unspecified: Secondary | ICD-10-CM

## 2024-05-01 DIAGNOSIS — F331 Major depressive disorder, recurrent, moderate: Secondary | ICD-10-CM | POA: Diagnosis not present

## 2024-05-01 DIAGNOSIS — F419 Anxiety disorder, unspecified: Secondary | ICD-10-CM | POA: Diagnosis not present

## 2024-05-01 DIAGNOSIS — F410 Panic disorder [episodic paroxysmal anxiety] without agoraphobia: Secondary | ICD-10-CM | POA: Diagnosis not present

## 2024-05-01 NOTE — Telephone Encounter (Signed)
 Pt of Dr. Raford. Does Dr. Raford want to refill? Please advise.

## 2024-05-14 DIAGNOSIS — L400 Psoriasis vulgaris: Secondary | ICD-10-CM | POA: Diagnosis not present

## 2024-05-14 DIAGNOSIS — L111 Transient acantholytic dermatosis [Grover]: Secondary | ICD-10-CM | POA: Diagnosis not present

## 2024-05-14 DIAGNOSIS — L821 Other seborrheic keratosis: Secondary | ICD-10-CM | POA: Diagnosis not present

## 2024-05-14 DIAGNOSIS — D692 Other nonthrombocytopenic purpura: Secondary | ICD-10-CM | POA: Diagnosis not present

## 2024-05-14 DIAGNOSIS — L814 Other melanin hyperpigmentation: Secondary | ICD-10-CM | POA: Diagnosis not present

## 2024-05-14 DIAGNOSIS — Z85828 Personal history of other malignant neoplasm of skin: Secondary | ICD-10-CM | POA: Diagnosis not present

## 2024-05-14 DIAGNOSIS — D225 Melanocytic nevi of trunk: Secondary | ICD-10-CM | POA: Diagnosis not present

## 2024-05-21 ENCOUNTER — Encounter (HOSPITAL_BASED_OUTPATIENT_CLINIC_OR_DEPARTMENT_OTHER): Payer: Self-pay

## 2024-05-22 DIAGNOSIS — L84 Corns and callosities: Secondary | ICD-10-CM | POA: Diagnosis not present

## 2024-05-22 DIAGNOSIS — L97521 Non-pressure chronic ulcer of other part of left foot limited to breakdown of skin: Secondary | ICD-10-CM | POA: Diagnosis not present

## 2024-05-22 DIAGNOSIS — M19071 Primary osteoarthritis, right ankle and foot: Secondary | ICD-10-CM | POA: Diagnosis not present

## 2024-05-22 DIAGNOSIS — L97511 Non-pressure chronic ulcer of other part of right foot limited to breakdown of skin: Secondary | ICD-10-CM | POA: Diagnosis not present

## 2024-05-22 DIAGNOSIS — M19072 Primary osteoarthritis, left ankle and foot: Secondary | ICD-10-CM | POA: Diagnosis not present

## 2024-05-22 DIAGNOSIS — I872 Venous insufficiency (chronic) (peripheral): Secondary | ICD-10-CM | POA: Diagnosis not present

## 2024-05-22 DIAGNOSIS — B351 Tinea unguium: Secondary | ICD-10-CM | POA: Diagnosis not present

## 2024-05-23 ENCOUNTER — Other Ambulatory Visit (HOSPITAL_BASED_OUTPATIENT_CLINIC_OR_DEPARTMENT_OTHER)

## 2024-05-23 DIAGNOSIS — I25118 Atherosclerotic heart disease of native coronary artery with other forms of angina pectoris: Secondary | ICD-10-CM | POA: Diagnosis not present

## 2024-05-23 DIAGNOSIS — I5022 Chronic systolic (congestive) heart failure: Secondary | ICD-10-CM | POA: Diagnosis not present

## 2024-05-23 LAB — ECHOCARDIOGRAM COMPLETE
Area-P 1/2: 5.13 cm2
Calc EF: 49.1 %
S' Lateral: 5.38 cm
Single Plane A2C EF: 51.2 %
Single Plane A4C EF: 44.9 %

## 2024-05-24 ENCOUNTER — Telehealth: Payer: Self-pay | Admitting: Family

## 2024-05-24 ENCOUNTER — Ambulatory Visit (HOSPITAL_BASED_OUTPATIENT_CLINIC_OR_DEPARTMENT_OTHER): Payer: Self-pay | Admitting: Family

## 2024-05-24 ENCOUNTER — Ambulatory Visit: Attending: Family

## 2024-05-24 DIAGNOSIS — I480 Paroxysmal atrial fibrillation: Secondary | ICD-10-CM

## 2024-05-24 NOTE — Telephone Encounter (Signed)
Please see echo result note.

## 2024-05-24 NOTE — Telephone Encounter (Signed)
 Pt's spouse returning nurses call regarding results. Please advise

## 2024-05-24 NOTE — Telephone Encounter (Signed)
 Error

## 2024-05-24 NOTE — Progress Notes (Unsigned)
 Enrolled patient for a 3 day Zio XT monitor to be mailed to patients home   [place] to read

## 2024-05-24 NOTE — Addendum Note (Signed)
 Addended by: TAMEA EDSEL SAUNDERS on: 05/24/2024 02:15 PM   Modules accepted: Orders

## 2024-05-28 ENCOUNTER — Telehealth: Payer: Self-pay

## 2024-05-28 ENCOUNTER — Encounter: Payer: Self-pay | Admitting: Internal Medicine

## 2024-05-28 ENCOUNTER — Ambulatory Visit: Payer: Medicare PPO | Admitting: Internal Medicine

## 2024-05-28 VITALS — BP 134/80 | HR 86 | Temp 97.9°F | Ht 75.0 in | Wt 254.0 lb

## 2024-05-28 DIAGNOSIS — F32A Depression, unspecified: Secondary | ICD-10-CM | POA: Diagnosis not present

## 2024-05-28 DIAGNOSIS — I5022 Chronic systolic (congestive) heart failure: Secondary | ICD-10-CM | POA: Diagnosis not present

## 2024-05-28 DIAGNOSIS — I2510 Atherosclerotic heart disease of native coronary artery without angina pectoris: Secondary | ICD-10-CM

## 2024-05-28 DIAGNOSIS — L209 Atopic dermatitis, unspecified: Secondary | ICD-10-CM | POA: Insufficient documentation

## 2024-05-28 DIAGNOSIS — F339 Major depressive disorder, recurrent, unspecified: Secondary | ICD-10-CM

## 2024-05-28 DIAGNOSIS — E291 Testicular hypofunction: Secondary | ICD-10-CM | POA: Diagnosis not present

## 2024-05-28 DIAGNOSIS — E785 Hyperlipidemia, unspecified: Secondary | ICD-10-CM

## 2024-05-28 DIAGNOSIS — N1831 Chronic kidney disease, stage 3a: Secondary | ICD-10-CM

## 2024-05-28 MED ORDER — ATORVASTATIN CALCIUM 80 MG PO TABS: 80.0000 mg | ORAL_TABLET | Freq: Every day | ORAL | 3 refills | Status: DC

## 2024-05-28 MED ORDER — EZETIMIBE 10 MG PO TABS: 10.0000 mg | ORAL_TABLET | Freq: Every day | ORAL | 3 refills | Status: DC

## 2024-05-28 MED ORDER — METOPROLOL TARTRATE 25 MG PO TABS: 25.0000 mg | ORAL_TABLET | Freq: Two times a day (BID) | ORAL | 3 refills | Status: DC

## 2024-05-28 MED ORDER — TESTOSTERONE ENANTHATE 200 MG/ML IM SOLN: 200.0000 mg | INTRAMUSCULAR | 3 refills | Status: DC

## 2024-05-28 MED ORDER — ACYCLOVIR 400 MG PO TABS: 400.0000 mg | ORAL_TABLET | Freq: Two times a day (BID) | ORAL | 3 refills | Status: AC

## 2024-05-28 NOTE — Assessment & Plan Note (Signed)
No angina.  Doing well. 

## 2024-05-28 NOTE — Assessment & Plan Note (Signed)
On Testosterone  Potential benefits of a long term testosterone use as well as potential risks (BPH, MI, OSA, cancer)  and complications were explained to the patient and were aknowledged. 

## 2024-05-28 NOTE — Assessment & Plan Note (Signed)
Hydrate well ?Monitor GFR ?

## 2024-05-28 NOTE — Assessment & Plan Note (Signed)
 On Cymbalta  Doing well.  Stable symptoms are under control

## 2024-05-28 NOTE — Progress Notes (Signed)
 Subjective:  Patient ID: [name], male    DOB: 01/19/1946  Age: 78 y.o. MRN: [id number]  CC: Medical Management of Chronic Issues (6 mnth f/u )   HPI [name] presents for HTN, hypogonadism, OA, LBP Labs w/other doctors  Outpatient Medications Prior to Visit  Medication Sig Dispense Refill   acetaminophen  (TYLENOL ) 325 MG tablet Take 2 tablets (650 mg total) by mouth every 6 (six) hours as needed for mild pain (or Fever >/= 101). 20 tablet    albuterol  (VENTOLIN  HFA) 108 (90 Base) MCG/ACT inhaler Inhale 2 puffs into the lungs every 6 (six) hours as needed for wheezing or shortness of breath.     alfuzosin  (UROXATRAL ) 10 MG 24 hr tablet TAKE 1 TABLET AT BEDTIME 90 tablet 3   allopurinol  (ZYLOPRIM ) 100 MG tablet TAKE 1 TABLET EVERY DAY 90 tablet 3   ARNUITY ELLIPTA 100 MCG/ACT AEPB Inhale 1 puff into the lungs daily.  on any day albuterol  is required for 30 days     augmented betamethasone dipropionate (DIPROLENE-AF) 0.05 % cream SMARTSIG:sparingly Topical Twice Daily     buPROPion  (WELLBUTRIN  XL) 300 MG 24 hr tablet TAKE 1 TABLET EVERY DAY WITH BREAKFAST 90 tablet 3   Calcium  Citrate-Vitamin D  (CALCIUM  CITRATE + D PO) Take 1 tablet by mouth 2 (two) times daily. 1200mg  of Calcium  and 1000 units vitamin D3     cephALEXin  (KEFLEX ) 500 MG capsule Take 1 capsule (500 mg total) by mouth 4 (four) times daily. 28 capsule 0   Cholecalciferol  (VITAMIN D ) 50 MCG (2000 UT) tablet Take 4,000 Units by mouth in the morning and at bedtime.     clonazePAM  (KLONOPIN ) 1 MG tablet TAKE 1 TABLET THREE TIMES DAILY AS NEEDED FOR ANXIETY 90 tablet 3   CRANBERRY PO Take 1 tablet by mouth in the morning and at bedtime.     denosumab  (PROLIA ) 60 MG/ML SOSY injection Inject 60 mg as directed every 6 (six) months.     DULoxetine  (CYMBALTA ) 60 MG capsule TAKE 1 CAPSULE TWICE DAILY 180 capsule 3   EPINEPHrine  0.3 mg/0.3 mL IJ SOAJ injection Inject 0.3 mg into the muscle once as needed for anaphylaxis  (severe allergic reaction).     feeding supplement (ENSURE ENLIVE / ENSURE PLUS) LIQD Take 237 mLs by mouth 2 (two) times daily between meals. (Patient taking differently: Take 237 mLs by mouth daily.) 237 mL 12   ferrous sulfate  325 (65 FE) MG tablet Take 325 mg by mouth 2 (two) times daily with a meal.     finasteride (PROSCAR) 5 MG tablet Take 5 mg by mouth daily.     HYDROcodone -acetaminophen  (NORCO/VICODIN) 5-325 MG tablet Take 1 tablet by mouth every 4 (four) hours as needed.     lactobacillus acidophilus (BACID) TABS tablet Take 1 tablet by mouth daily.     levothyroxine  (SYNTHROID ) 125 MCG tablet Take 1 tablet (125 mcg total) by mouth daily at 6 (six) AM. 90 tablet 3   lidocaine -prilocaine  (EMLA ) cream Apply 1 Application topically as needed. (Patient taking differently: Apply 1 Application topically as needed (For port access/chemotherapy).) 30 g 0   loratadine  (CLARITIN ) 10 MG tablet Take 10 mg by mouth daily.     Magnesium  250 MG TABS Take 250 mg by mouth daily.     Multiple Vitamin (MULTIVITAMIN WITH MINERALS) TABS tablet Take 1 tablet by mouth every morning. Men's One-A-Day 50+     MYRBETRIQ  25 MG TB24 tablet TAKE 1 TABLET EVERY DAY (Patient  taking differently: Take 25 mg by mouth at bedtime.) 90 tablet 3   nitroGLYCERIN  (NITROSTAT ) 0.4 MG SL tablet Place 0.4 mg under the tongue every 5 (five) minutes as needed for chest pain.     omeprazole  (PRILOSEC) 40 MG capsule TAKE 1 CAPSULE EVERY DAY BEFORE BREAKFAST 90 capsule 3   Potassium Citrate 15 MEQ (1620 MG) TBCR TAKE 1 TABLET IN THE MORNING AND AT BEDTIME 180 tablet 3   Probiotic Product (PROBIOTIC PO) Take 1 capsule by mouth daily with breakfast.     Risankizumab-rzaa (SKYRIZI [place]) Inject into the skin. On hold     SKYRIZI PEN 150 MG/ML pen      solifenacin (VESICARE) 10 MG tablet Take 10 mg by mouth daily.     Specialty Vitamins Products (MG PLUS PROTEIN) 133 MG TABS Take 1 tablet by mouth 3 (three) times daily.     tamsulosin   (FLOMAX ) 0.4 MG CAPS capsule Take 1 capsule by mouth daily.     vitamin C  (ASCORBIC ACID ) 500 MG tablet Take 500 mg by mouth daily with lunch.     acyclovir  (ZOVIRAX ) 400 MG tablet Take 1 tablet (400 mg total) by mouth 2 (two) times daily. 180 tablet 3   atorvastatin  (LIPITOR ) 80 MG tablet TAKE 1 TABLET EVERY DAY 90 tablet 3   ezetimibe  (ZETIA ) 10 MG tablet TAKE 1 TABLET EVERY DAY 90 tablet 3   metoprolol  tartrate (LOPRESSOR ) 25 MG tablet TAKE 1 TABLET TWICE DAILY 180 tablet 3   testosterone  enanthate (DELATESTRYL ) 200 MG/ML injection INJECT 1ML (200MG ) INTO THE MUSCLE EVERY 14 DAYS. DISCARD VIAL AFTER 28 DAYS. (Patient taking differently: Inject 200 mg into the muscle every 14 (fourteen) days. Every other Friday) 5 mL 3   No facility-administered medications prior to visit.    ROS: Review of Systems  Constitutional:  Positive for fatigue. Negative for appetite change and unexpected weight change.  HENT:  Negative for congestion, nosebleeds, sneezing, sore throat and trouble swallowing.   Eyes:  Negative for itching and visual disturbance.  Respiratory:  Negative for cough.   Cardiovascular:  Negative for chest pain, palpitations and leg swelling.  Gastrointestinal:  Negative for abdominal distention, blood in stool, diarrhea and nausea.  Genitourinary:  Negative for frequency and hematuria.  Musculoskeletal:  Positive for arthralgias, back pain and gait problem. Negative for joint swelling and neck pain.  Skin:  Positive for color change. Negative for rash.  Neurological:  Negative for dizziness, tremors, speech difficulty and weakness.  Psychiatric/Behavioral:  Positive for decreased concentration. Negative for agitation, dysphoric mood, sleep disturbance and suicidal ideas. The patient is not nervous/anxious.     Objective:  BP 134/80   Pulse 86   Temp 97.9 F (36.6 C) (Oral)   Ht 6' 3 (1.905 m)   Wt 254 lb (115.2 kg)   SpO2 97%   BMI 31.75 kg/m   BP Readings from Last 3  Encounters:  05/28/24 134/80  03/19/24 (!) 100/52  02/07/24 128/87    Wt Readings from Last 3 Encounters:  05/28/24 254 lb (115.2 kg)  02/02/24 245 lb (111.1 kg)  01/24/24 245 lb (111.1 kg)    Physical Exam Constitutional:      General: He is not in acute distress.    Appearance: He is well-developed. He is obese.     Comments: NAD  Eyes:     Conjunctiva/sclera: Conjunctivae normal.     Pupils: Pupils are equal, round, and reactive to light.  Neck:     Thyroid :  No thyromegaly.     Vascular: No JVD.  Cardiovascular:     Rate and Rhythm: Normal rate and regular rhythm.     Heart sounds: Normal heart sounds. No murmur heard.    No friction rub. No gallop.  Pulmonary:     Effort: Pulmonary effort is normal. No respiratory distress.     Breath sounds: Normal breath sounds. No wheezing or rales.  Chest:     Chest wall: No tenderness.  Abdominal:     General: Bowel sounds are normal. There is no distension.     Palpations: Abdomen is soft. There is no mass.     Tenderness: There is no abdominal tenderness. There is no guarding or rebound.  Musculoskeletal:        General: Tenderness and deformity present. Normal range of motion.     Cervical back: Normal range of motion.  Lymphadenopathy:     Cervical: No cervical adenopathy.  Skin:    General: Skin is warm and dry.     Findings: Bruising present. No lesion or rash.  Neurological:     Mental Status: He is alert and oriented to person, place, and time.     Cranial Nerves: No cranial nerve deficit.     Motor: No abnormal muscle tone.     Coordination: Coordination normal.     Gait: Gait abnormal.     Deep Tendon Reflexes: Reflexes are normal and symmetric.  Psychiatric:        Behavior: Behavior normal.        Thought Content: Thought content normal.        Judgment: Judgment normal.   LS w/pain, using a walker Hyperpigmentation on lower legs  Lab Results  Component Value Date   WBC 6.6 11/21/2023   HGB 15.1  11/21/2023   HCT 47.0 11/21/2023   PLT 175 11/21/2023   GLUCOSE 95 11/21/2023   CHOL 102 01/28/2023   TRIG 68 01/28/2023   HDL 51 01/28/2023   LDLDIRECT 37 01/28/2023   LDLCALC 36 01/28/2023   ALT 20 11/21/2023   AST 21 11/21/2023   NA 137 11/21/2023   K 4.7 11/21/2023   CL 102 11/21/2023   CREATININE 1.66 (H) 11/21/2023   BUN 27 (H) 11/21/2023   CO2 28 11/21/2023   TSH 14.031 (H) 09/06/2022   PSA 1.08 06/26/2020   INR 1.0 08/13/2023   HGBA1C 5.8 (H) 02/06/2013    No results found.  Assessment & Plan:   Problem List Items Addressed This Visit     CAD (coronary artery disease)   No angina.  Doing well      Relevant Medications   atorvastatin  (LIPITOR ) 80 MG tablet   ezetimibe  (ZETIA ) 10 MG tablet   metoprolol  tartrate (LOPRESSOR ) 25 MG tablet   Chronic systolic CHF (congestive heart failure) (HCC) - Primary   CHF seems to be compensated.  Continue with current meds F/u w/ Dr Raford       Relevant Medications   atorvastatin  (LIPITOR ) 80 MG tablet   ezetimibe  (ZETIA ) 10 MG tablet   metoprolol  tartrate (LOPRESSOR ) 25 MG tablet   Depression   On Cymbalta  Doing well.  Stable symptoms are under control      Hypogonadism in male   On Testosterone   Potential benefits of a long term testosterone  use as well as potential risks (BPH, MI, OSA, cancer)  and complications were explained to the patient and were aknowledged.      Stage 3a chronic kidney disease (CKD) (HCC) -  Baseline Scr 1.5 - 1.6   Hydrate well Monitor GFR      Other Visit Diagnoses       Hyperlipidemia LDL goal <70       Relevant Medications   atorvastatin  (LIPITOR ) 80 MG tablet   ezetimibe  (ZETIA ) 10 MG tablet   metoprolol  tartrate (LOPRESSOR ) 25 MG tablet         Meds ordered this encounter  Medications   testosterone  enanthate (DELATESTRYL ) 200 MG/ML injection    Sig: Inject 1 mL (200 mg total) into the muscle every 14 (fourteen) days. Every other Friday    Dispense:  6 mL     Refill:  3    Fax to [facility]   acyclovir  (ZOVIRAX ) 400 MG tablet    Sig: Take 1 tablet (400 mg total) by mouth 2 (two) times daily.    Dispense:  180 tablet    Refill:  3   atorvastatin  (LIPITOR ) 80 MG tablet    Sig: Take 1 tablet (80 mg total) by mouth daily.    Dispense:  90 tablet    Refill:  3   ezetimibe  (ZETIA ) 10 MG tablet    Sig: Take 1 tablet (10 mg total) by mouth daily.    Dispense:  90 tablet    Refill:  3   metoprolol  tartrate (LOPRESSOR ) 25 MG tablet    Sig: Take 1 tablet (25 mg total) by mouth 2 (two) times daily.    Dispense:  180 tablet    Refill:  3      Follow-up: Return in about 6 months (around 11/28/2024) for a follow-up visit.  Joseph Noel, MD

## 2024-05-28 NOTE — Telephone Encounter (Signed)
 Copied from CRM #[id number]. Topic: Clinical - Medication Question >> May 28, 2024  1:38 PM Chiquita SQUIBB wrote: Reason for CRM: Patient is calling in with the fax number for the [facility] the testosterone  enanthate (DELATESTRYL ) 200 MG/ML injection [[id number]] should go to. The fax number is [phone].

## 2024-05-28 NOTE — Assessment & Plan Note (Signed)
 CHF seems to be compensated.  Continue with current meds F/u w/ Dr Raford

## 2024-05-29 NOTE — Telephone Encounter (Signed)
 Meade, please fax his testosterone  prescription to this fax number.  Thank you

## 2024-05-30 DIAGNOSIS — F410 Panic disorder [episodic paroxysmal anxiety] without agoraphobia: Secondary | ICD-10-CM | POA: Diagnosis not present

## 2024-05-30 DIAGNOSIS — F419 Anxiety disorder, unspecified: Secondary | ICD-10-CM | POA: Diagnosis not present

## 2024-05-30 DIAGNOSIS — F331 Major depressive disorder, recurrent, moderate: Secondary | ICD-10-CM | POA: Diagnosis not present

## 2024-05-31 ENCOUNTER — Other Ambulatory Visit (HOSPITAL_BASED_OUTPATIENT_CLINIC_OR_DEPARTMENT_OTHER): Payer: Self-pay | Admitting: Family

## 2024-05-31 DIAGNOSIS — I2510 Atherosclerotic heart disease of native coronary artery without angina pectoris: Secondary | ICD-10-CM

## 2024-05-31 DIAGNOSIS — E785 Hyperlipidemia, unspecified: Secondary | ICD-10-CM

## 2024-05-31 NOTE — Telephone Encounter (Unsigned)
 Copied from CRM #[id number]. Topic: Clinical - Prescription Issue >> May 31, 2024  8:45 AM Franky GRADE wrote: Reason for CRM: Patient is calling because he followed up with [facility] and they had not received the prescription for testosterone  enanthate (DELATESTRYL ) 200 MG/ML injection [[id number]]. He would like for us  to try and resend it.

## 2024-06-01 ENCOUNTER — Other Ambulatory Visit: Payer: Self-pay

## 2024-06-01 DIAGNOSIS — R7989 Other specified abnormal findings of blood chemistry: Secondary | ICD-10-CM

## 2024-06-01 DIAGNOSIS — E291 Testicular hypofunction: Secondary | ICD-10-CM

## 2024-06-01 MED ORDER — TESTOSTERONE ENANTHATE 200 MG/ML IM SOLN: 200.0000 mg | INTRAMUSCULAR | 3 refills | Status: DC

## 2024-06-01 NOTE — Telephone Encounter (Signed)
 Medication has been faxed over as requested

## 2024-06-04 ENCOUNTER — Telehealth: Payer: Self-pay

## 2024-06-04 NOTE — Telephone Encounter (Signed)
 Copied from CRM #[id number]. Topic: Clinical - Prescription Issue >> Jun 04, 2024  2:16 PM Mercedes MATSU wrote: Reason for CRM: Patient called in stating that the pharmacy called in stating that they needed clarification on the patients Testerone patient is requesting for the doctor to call [facility] today if possible. >> Jun 04, 2024  2:45 PM CMA Lila C wrote: Wrong office

## 2024-06-05 ENCOUNTER — Encounter (HOSPITAL_BASED_OUTPATIENT_CLINIC_OR_DEPARTMENT_OTHER): Payer: Self-pay | Admitting: Family

## 2024-06-05 ENCOUNTER — Ambulatory Visit (INDEPENDENT_AMBULATORY_CARE_PROVIDER_SITE_OTHER): Admitting: Family

## 2024-06-05 ENCOUNTER — Ambulatory Visit (HOSPITAL_BASED_OUTPATIENT_CLINIC_OR_DEPARTMENT_OTHER): Admitting: Family

## 2024-06-05 ENCOUNTER — Encounter (HOSPITAL_BASED_OUTPATIENT_CLINIC_OR_DEPARTMENT_OTHER): Payer: Self-pay

## 2024-06-05 VITALS — BP 116/64 | HR 76 | Ht 75.0 in | Wt 259.0 lb

## 2024-06-05 DIAGNOSIS — G4733 Obstructive sleep apnea (adult) (pediatric): Secondary | ICD-10-CM

## 2024-06-05 DIAGNOSIS — E785 Hyperlipidemia, unspecified: Secondary | ICD-10-CM | POA: Diagnosis not present

## 2024-06-05 DIAGNOSIS — I25118 Atherosclerotic heart disease of native coronary artery with other forms of angina pectoris: Secondary | ICD-10-CM

## 2024-06-05 DIAGNOSIS — I5020 Unspecified systolic (congestive) heart failure: Secondary | ICD-10-CM | POA: Diagnosis not present

## 2024-06-05 DIAGNOSIS — I4821 Permanent atrial fibrillation: Secondary | ICD-10-CM

## 2024-06-05 MED ORDER — CEPHALEXIN 250 MG PO CAPS: 250.0000 mg | ORAL_CAPSULE | Freq: Every evening | ORAL | Status: DC

## 2024-06-05 MED ORDER — LOSARTAN POTASSIUM 25 MG PO TABS: 12.5000 mg | ORAL_TABLET | Freq: Every evening | ORAL | 3 refills | Status: DC

## 2024-06-05 NOTE — H&P (View-Only) (Signed)
 Cardiology Office Note:  .   Date:  06/05/2024  ID:  [name], DOB 1946/09/12, MRN [id number] PCP: Garald Karlynn GAILS, MD  [facility] Providers Cardiologist:  Annabella Scarce, MD Cardiology APP:  Vannie Reche RAMAN, NP    History of Present Illness: .   [name] is a 78 y.o. male with a hx of relapsed large B-cell lymphoma, CAD, recurrent UTI, atrial fibrillation, BPH, diastolic heart failure, hypertension, hyperlipidemia, OSA, hypothyroid, prior tobacco use.   Initially diagnosed atrial fibrillation around 2012 and started on diltiazem  and amiodarone .  Amiodarone  discontinued due to persistent atrial fibrillation.  Underwent LHC at time of diagnosis with reported 90% obstructive of unknown vessel that developed collaterals.  He was started on Ranexa .  Echo 10/2016 EF 60 to 65%, severe bilateral atrial enlargement, IVC dilated.  He had no angina so Ranexa  discontinued.   Diagnosed with diffuse large B-cell lymphoma 05/2018.  Completed 6 cycles of chemotherapy.  Based on echo 06/2018 EF 50 to 55%, normal diastolic function, apical hypokinesis.  Heart rate poorly controlled to metoprolol  added.  Seen in the ED and BNP elevated to Lasix  20 mg daily added with improvement in breathing.  Echo 06/2019 EF 50-55%, RV mildly reduced systolic function.  Lasix  was transitioned to hydrochlorothiazide  due to urinary frequency and poor blood pressure control.  LHC 08/2019 significant two-vessel CAD sequential 60 to 70% mid LAD lesions and CTO of mid RCA with bridging left-to-right collaterals.  Consideration for two-vessel CABG versus medical management.  Deemed not candidate for surgery.  He was started on Imdur .  Due to kidney stones and urination HCTZ reduced and losartan  increased.   03/22/22 chemo resumed due to recurrence of diffuse large b-cell lymphoma.    Admitted 7/13 - 04/27/2022 after presenting with fatigue, lightheadedness found to have severe sepsis treated with IVF and IV abx  due to urinary tract infection E. coli ESBL. Due to orthostatic hypotension and near syncope, diltiazem  dose reduced. Xarelto  and aspirin  held due to thrombocytopenia. Seen 05/04/2022 by his oncology team and referred to ID  for recurrent UTI.  He was set up for IV ABX through home care.   He has been off Xarelto  since 10/18/22 due to hematuria, frequent falls as risks outweighed benefits.   At visit 12/09/23 he was undergoing Epcoritamab  bispecific for refractory DLBCL with [facility] oncology.  Updated echo 05/23/24 reduced LVEF 25-30% (previously 35-40%), moderate RV dysfunction. He was recommended for 3 day ZIO to assess rate control of atrial fibrillation. It is en route back to ZIO.  Presents today for follow up with his husband. Notes stable exertional dyspnea. No chest pain, edema. Does note continued weight gain and some abdominal bloating. Notes previously wore CPAP and has had had difficulties wth getting it reset after using the restroom. Last used CPAP 2-3 years ago with last sleep study 10 years ago. Husband has noted he stops breathing and would like to repeat sleep study to consider further treatment. Reviewed echo with further reduced LVEF. Has not had recent lightheadedness has has intentionally made position changes slowly.    ROS: Please see the history of present illness.    All other systems reviewed and are negative.   Studies Reviewed: .       Cardiac Studies & Procedures   ______________________________________________________________________________________________ CARDIAC CATHETERIZATION  CARDIAC CATHETERIZATION 08/13/2019  Conclusion Conclusions: 1. Significant two-vessel coronary artery disease, including sequential 60% proximal and 70% mid LAD stenosis, which are hemodynamically significant by DFR and also  involve large D2 branch, as well as chronic total occlusion of the mid RCA with bridging and left-to-right collaterals. 2. Mildly elevated left heart and pulmonary artery  pressures.  Prominent v-waves noted on PCWP; question if mitral regurgitation is more severe than appreciated on recent transthoracic echocardiogram. 3. Moderately elevated right heart filling pressures. 4. Normal Fick cardiac output/index.  Recommendations: 1. Outpatient cardiac surgery evaluation, given significant two vessel coronary artery disease including proximal LAD involvement and CTO of the RCA.  If comorbidities pose an excessively high perioperative risk, I would favor maximizing medical therapy.  If the patient has refractory symptoms, PCI to the proximal/mid LAD and D2 could be considered.  I would defer PCI to CTO of the RCA, as this has reportedly been present for 20+ years. 2. Consider gentle diuresis and continued medical management of chronic HFpEF and persistent atrial fibrillation. 3. Aggressive secondary prevention.  Lonni Hanson, MD [facility] Pager: [phone]  Findings Coronary Findings Diagnostic  Dominance: Right  Left Main Vessel is large. Vessel is angiographically normal.  Left Anterior Descending Prox LAD lesion is 60% stenosed. Pressure wire/FFR was performed on the lesion. DFR = 0.87. Mid LAD lesion is 70% stenosed. The lesion is eccentric. Pressure wire/FFR was performed on the lesion. DFR = 0.81.  First Diagonal Branch Vessel is small in size.  Second Diagonal Branch Vessel is large in size.  Left Circumflex Vessel is large. Mid Cx lesion is 20% stenosed.  First Obtuse Marginal Branch Vessel is moderate in size.  Second Obtuse Marginal Branch Vessel is large in size.  Third Obtuse Marginal Branch Vessel is large in size.  Right Coronary Artery Vessel is large. Collaterals Mid RCA filled by collaterals from Prox RCA.  Prox RCA to Mid RCA lesion is 99% stenosed. The lesion is chronically occluded with bridging and left-to-right collateral flow.  Right Posterior Atrioventricular Artery Collaterals RPAV filled by  collaterals from Dist Cx.  Intervention  No interventions have been documented.     ECHOCARDIOGRAM  ECHOCARDIOGRAM COMPLETE 05/23/2024  Narrative ECHOCARDIOGRAM REPORT    Patient Name:   [name] Date of Exam: 05/23/2024 Medical Rec #:  [id number]        Height:       75.0 in Accession #:    [id number]       Weight:       245.0 lb Date of Birth:  30-Oct-1945        BSA:          2.392 m Patient Age:    39 years         BP:           100/52 mmHg Patient Gender: M                HR:           76 bpm. Exam Location:  Outpatient  Procedure: 2D Echo, 3D Echo, Cardiac Doppler and Color Doppler (Both Spectral and Color Flow Doppler were utilized during procedure).  Indications:    CHF  History:        Patient has prior history of Echocardiogram examinations, most recent 09/06/2022. CAD, Arrythmias:Atrial Fibrillation; Risk Factors:Hypertension, Dyslipidemia and Former Smoker. Relapsed large B-cell lymphoma.  Sonographer:    Orvil Holmes RDCS Referring Phys: [id number] Kailen Name S Latoshia Monrroy  IMPRESSIONS   1. Left ventricular ejection fraction, by estimation, is 25 to 30%. The left ventricle has severely decreased function. The left ventricle demonstrates global hypokinesis. The left ventricular internal  cavity size was moderately dilated. Left ventricular diastolic function could not be evaluated. The average left ventricular global longitudinal strain is -7.1 %. The global longitudinal strain is abnormal. 2. Right ventricular systolic function is moderately reduced. The right ventricular size is moderately enlarged. 3. Left atrial size was moderately dilated. 4. Right atrial size was moderately dilated. 5. The mitral valve is normal in structure. Trivial mitral valve regurgitation. No evidence of mitral stenosis. 6. The aortic valve was not well visualized. Aortic valve regurgitation is not visualized. No aortic stenosis is present.  Comparison(s): Prior images reviewed side  by side. Changes from prior study are noted. LV more dilated, EF worse compared to prior study.  FINDINGS Left Ventricle: Left ventricular ejection fraction, by estimation, is 25 to 30%. The left ventricle has severely decreased function. The left ventricle demonstrates global hypokinesis. The average left ventricular global longitudinal strain is -7.1 %. Strain was performed and the global longitudinal strain is abnormal. 3D ejection fraction reviewed and evaluated as part of the interpretation. Alternate measurement of EF is felt to be most reflective of LV function. The left ventricular internal cavity size was moderately dilated. There is no left ventricular hypertrophy. Left ventricular diastolic function could not be evaluated due to atrial fibrillation. Left ventricular diastolic function could not be evaluated.  Right Ventricle: The right ventricular size is moderately enlarged. Right vetricular wall thickness was not well visualized. Right ventricular systolic function is moderately reduced.  Left Atrium: Left atrial size was moderately dilated.  Right Atrium: Right atrial size was moderately dilated.  Pericardium: There is no evidence of pericardial effusion.  Mitral Valve: The mitral valve is normal in structure. Trivial mitral valve regurgitation. No evidence of mitral valve stenosis.  Tricuspid Valve: The tricuspid valve is normal in structure. Tricuspid valve regurgitation is trivial. No evidence of tricuspid stenosis.  Aortic Valve: The aortic valve was not well visualized. Aortic valve regurgitation is not visualized. No aortic stenosis is present.  Pulmonic Valve: The pulmonic valve was not well visualized. Pulmonic valve regurgitation is not visualized. No evidence of pulmonic stenosis.  Aorta: The aortic root, ascending aorta and aortic arch are all structurally normal, with no evidence of dilitation or obstruction.  Venous: The inferior vena cava was not well  visualized.  IAS/Shunts: The atrial septum is grossly normal.  Additional Comments: 3D was performed not requiring image post processing on an independent workstation and was indeterminate.   LEFT VENTRICLE PLAX 2D LVIDd:         6.23 cm      Diastology LVIDs:         5.38 cm      LV e' medial:    7.83 cm/s LV PW:         0.92 cm      LV E/e' medial:  10.8 LV IVS:        0.85 cm      LV e' lateral:   12.20 cm/s LVOT diam:     2.00 cm      LV E/e' lateral: 7.0 LV SV:         63 LV SV Index:   26           2D Longitudinal Strain LVOT Area:     3.14 cm     2D Strain GLS (A4C):   -9.5 % 2D Strain GLS (A3C):   -6.0 % 2D Strain GLS (A2C):   -5.9 % LV Volumes (MOD)  2D Strain GLS Avg:     -7.1 % LV vol d, MOD A2C: 165.0 ml LV vol d, MOD A4C: 133.0 ml LV vol s, MOD A2C: 80.6 ml LV vol s, MOD A4C: 73.3 ml  3D Volume EF: LV SV MOD A2C:     84.4 ml  LV EDV:       190 ml LV SV MOD A4C:     133.0 ml LV ESV:       82 ml LV SV MOD BP:      74.8 ml  LV SV:        108 ml  RIGHT VENTRICLE RV Basal diam:  4.93 cm RV Mid diam:    3.97 cm RV S prime:     9.90 cm/s TAPSE (M-mode): 0.9 cm  LEFT ATRIUM              Index        RIGHT ATRIUM           Index LA diam:        4.90 cm  2.05 cm/m   RA Area:     23.60 cm LA Vol (A2C):   108.0 ml 45.15 ml/m  RA Volume:   72.70 ml  30.39 ml/m LA Vol (A4C):   94.6 ml  39.55 ml/m LA Biplane Vol: 103.0 ml 43.06 ml/m AORTIC VALVE LVOT Vmax:   95.40 cm/s LVOT Vmean:  59.700 cm/s LVOT VTI:    0.199 m  AORTA Ao Root diam: 3.50 cm  MITRAL VALVE MV Area (PHT): 5.13 cm    SHUNTS MV Decel Time: 148 msec    Systemic VTI:  0.20 m MV E velocity: 84.90 cm/s  Systemic Diam: 2.00 cm  Shelda Bruckner MD Electronically signed by Shelda Bruckner MD Signature Date/Time: 05/23/2024/6:50:44 PM    Final          ______________________________________________________________________________________________         Risk  Assessment/Calculations:    CHA2DS2-VASc Score = 4   This indicates a 4.8% annual risk of stroke. The patient's score is based upon: CHF History: 1 HTN History: 1 Diabetes History: 0 Stroke History: 0 Vascular Disease History: 0 Age Score: 2 Gender Score: 0            Physical Exam:   VS:  BP 116/64 (BP Location: Left Arm, Patient Position: Sitting, Cuff Size: Normal)   Pulse 76   Ht 6' 3 (1.905 m)   Wt 259 lb (117.5 kg)   BMI 32.37 kg/m    Wt Readings from Last 3 Encounters:  06/05/24 259 lb (117.5 kg)  05/28/24 254 lb (115.2 kg)  02/02/24 245 lb (111.1 kg)    GEN: Well nourished, well developed in no acute distress NECK: No JVD; No carotid bruits CARDIAC: IRIR, no murmurs, rubs, gallops RESPIRATORY:  Clear to auscultation without rales, wheezing or rhonchi  ABDOMEN: Soft, non-tender, non-distended EXTREMITIES:  No edema; No deformity. R inner ankle with small abrasion, bandaid placed in clinic.   ASSESSMENT AND PLAN: .    Longstanding persistent atrial fibrillation - Xarelto  has been discontinued due to thrombocytosis, hematuria, fall risk. We discussed risk/benefit and he prefers to remain off OAC at this time and verbalizes understanding of CVA risk.  Asymptomatic regarding atrial fibrillation.Rate controlled with heart rate routinely less than 100 bpm at home per his report.  Await ZIO monitor report, presently en route to ZIO. Continue metoprolol  tartrate 25 mg twice daily.    CHA2DS2-VASc Score = 4 [CHF  History: 1, HTN History: 1, Diabetes History: 0, Stroke History: 0, Vascular Disease History: 0, Age Score: 2, Gender Score: 0].  Therefore, the patient's annual risk of stroke is 4.8 %.       B cell lymphoma - On completed Epcoritamab  01/2024 per oncology at [facility].     Recurrent UTI -  Followed with Dr. Fleeta Rothman of ID. Hematuria has been much improved since discontinuation of Xarelto . On daily Keflex .    Hypothyroidism - Continue to follow with PCP.    CAD / HLD, LDL  goal <70 -medically managed moderate to severe CAD. Given further reduced LVEF, plan for [facility]. Delay until 9/22 as his husband is having procedure. Notes stable exertional dyspnea.  GDMT metoprolol  tartrate 25mg  BID, atorvastatin  80mg  daily.  Imdur  previously discontinued due to hypotension.    Systolic and diastolic heart failure - cho 06/2019 LVEF 50-55% ? Echo 08/2022 LVEF 35-40% ? 05/2024 LVEF 25-30%. Gradual weight gain consistent for volume overload. Stable exertional dyspnea. No LE edema though does note abdominal bloating. Continue beta-blocker Metoprolol  tartrate 25mg  BID at present dose. Losartan , imdur  previously discontinued due to hypotension. Will cautiously try Losartan  12.5mg  at bedtime. BMET/BNP in 2 weeks. Low sodium diet, fluid restriction <2L, and daily weights encouraged. Educated to contact our office for weight gain of 2 lbs overnight or 5 lbs in one week.  Will route to HF team for input on any suggested next steps.   Hypertension - BP well controlled. Trial low dose Losartan  12.5mg  at bedtime for HF benefit, as above..  Previous hypotension has resolved.  OSA - prior sleep study >10 years ago. Last used CPAP 2-3 years ago. Interested in trying again. Spouse notes he stops breathing, also some daytime somnolence. Plan for [employer]. STOPBang 5    Informed Consent   Shared Decision Making/Informed Consent The risks [stroke (1 in 1000), death (1 in 1000), kidney failure [usually temporary] (1 in 500), bleeding (1 in 200), allergic reaction [possibly serious] (1 in 200)], benefits (diagnostic support and management of coronary artery disease) and alternatives of a cardiac catheterization were discussed in detail with Mr. Borawski and he is willing to proceed.        Dispo: follow up in 2-3 weks post LHC  Signed, Reche GORMAN Finder, NP

## 2024-06-05 NOTE — Patient Instructions (Addendum)
 Medication Instructions:   START Losartan  one half (0.5) tablet ( 12.5 mg) by mouth every evening starting Friday.   *If you need a refill on your cardiac medications before your next appointment, please call your pharmacy*  Lab Work:  Your physician recommends that you return for lab work in 2 weeks. Paperwork given to patient today.    If you have labs (blood work) drawn today and your tests are completely normal, you will receive your results only by: MyChart Message (if you have MyChart) OR A paper copy in the mail If you have any lab test that is abnormal or we need to change your treatment, we will call you to review the results.  Testing/Procedures:         Cardiac/Peripheral Catheterization   You are scheduled for a Cardiac Catheterization on Monday, September 22 with Dr. Lonni End.  1. Please arrive at the [facility] (Main Entrance A) at [facility]: [address] [place], [place] [address] at 7:00 AM (This time is 2 hour(s) before your procedure to ensure your preparation).   Free valet parking service is available. You will check in at ADMITTING. The support person will be asked to wait in the waiting room.  It is OK to have someone drop you off and come back when you are ready to be discharged.        Special note: Every effort is made to have your procedure done on time. Please understand that emergencies sometimes delay scheduled procedures.  2. Diet: NPO: Nothing to eat OR drink after midnight. (For TEE and Cath the same day)   4. Labs: You will need to have blood drawn on Tuesday, September 9 at [facility] at [facility]:  [address] [place], [place] [address] (lab is in the Primary Care office located on the 3rd floor).  Hours: 8:00 am - 4:30 pm (avoid 12:00 pm - 1:00 pm)  Phone: [phone].  Tell staff you are there for blood work and they will direct you to the lab.  You do not need an appointment. You do not need  to be fasting.  5. Medication instructions in preparation for your procedure:   Contrast Allergy: No  Stop taking, Cozaar  (Losartan ) Sunday, September 21,   On the morning of your procedure, take Aspirin  81 mg and any morning medicines NOT listed above.  You may use sips of water.  6. Plan to go home the same day, you will only stay overnight if medically necessary. 7. You MUST have a responsible adult to drive you home. 8. An adult MUST be with you the first 24 hours after you arrive home. 9. Bring a current list of your medications, and the last time and date medication taken. 10. Bring ID and current insurance cards. 11.Please wear clothes that are easy to get on and off and wear slip-on shoes.  Thank you for allowing us  to care for you!   -- [facility]   Follow-Up: At [facility], you and your health needs are our priority.  As part of our continuing mission to provide you with exceptional heart care, our providers are all part of one team.  This team includes your primary Cardiologist (physician) and Advanced Practice Providers or APPs (Physician Assistants and Nurse Practitioners) who all work together to provide you with the care you need, when you need it.  Your next appointment:   2 month(s)  Provider:  Reche Finder, NP    We recommend signing up for the patient portal called MyChart.  Sign up information is provided on this After Visit Summary.  MyChart is used to connect with patients for Virtual Visits (Telemedicine).  Patients are able to view lab/test results, encounter notes, upcoming appointments, etc.  Non-urgent messages can be sent to your provider as well.   To learn more about what you can do with MyChart, go to [email or web address].

## 2024-06-05 NOTE — Progress Notes (Addendum)
 Cardiology Office Note:  .   Date:  06/05/2024  ID:  [name], DOB 1946/09/12, MRN [id number] PCP: Joseph Karlynn GAILS, MD  [name] Cardiologist:  Joseph Scarce, MD Cardiology APP:  Joseph Reche RAMAN, NP    History of Present Illness: .   [name] is a 78 y.o. male with a hx of relapsed large B-cell lymphoma, CAD, recurrent UTI, atrial fibrillation, BPH, diastolic heart failure, hypertension, hyperlipidemia, OSA, hypothyroid, prior tobacco use.   Initially diagnosed atrial fibrillation around 2012 and started on diltiazem  and amiodarone .  Amiodarone  discontinued due to persistent atrial fibrillation.  Underwent LHC at time of diagnosis with reported 90% obstructive of unknown vessel that developed collaterals.  He was started on Ranexa .  Echo 10/2016 EF 60 to 65%, severe bilateral atrial enlargement, IVC dilated.  He had no angina so Ranexa  discontinued.   Diagnosed with diffuse large B-cell lymphoma 05/2018.  Completed 6 cycles of chemotherapy.  Based on echo 06/2018 EF 50 to 55%, normal diastolic function, apical hypokinesis.  Heart rate poorly controlled to metoprolol  added.  Seen in the ED and BNP elevated to Lasix  20 mg daily added with improvement in breathing.  Echo 06/2019 EF 50-55%, RV mildly reduced systolic function.  Lasix  was transitioned to hydrochlorothiazide  due to urinary frequency and poor blood pressure control.  LHC 08/2019 significant two-vessel CAD sequential 60 to 70% mid LAD lesions and CTO of mid RCA with bridging left-to-right collaterals.  Consideration for two-vessel CABG versus medical management.  Deemed not candidate for surgery.  He was started on Imdur .  Due to kidney stones and urination HCTZ reduced and losartan  increased.   03/22/22 chemo resumed due to recurrence of diffuse large b-cell lymphoma.    Admitted 7/13 - 04/27/2022 after presenting with fatigue, lightheadedness found to have severe sepsis treated with IVF and IV abx  due to urinary tract infection E. coli ESBL. Due to orthostatic hypotension and near syncope, diltiazem  dose reduced. Xarelto  and aspirin  held due to thrombocytopenia. Seen 05/04/2022 by his oncology team and referred to ID  for recurrent UTI.  He was set up for IV ABX through home care.   He has been off Xarelto  since 10/18/22 due to hematuria, frequent falls as risks outweighed benefits.   At visit 12/09/23 he was undergoing Epcoritamab  bispecific for refractory DLBCL with [facility] oncology.  Updated echo 05/23/24 reduced LVEF 25-30% (previously 35-40%), moderate RV dysfunction. He was recommended for 3 day ZIO to assess rate control of atrial fibrillation. It is en route back to ZIO.  Presents today for follow up with his husband. Notes stable exertional dyspnea. No chest pain, edema. Does note continued weight gain and some abdominal bloating. Notes previously wore CPAP and has had had difficulties wth getting it reset after using the restroom. Last used CPAP 2-3 years ago with last sleep study 10 years ago. Husband has noted he stops breathing and would like to repeat sleep study to consider further treatment. Reviewed echo with further reduced LVEF. Has not had recent lightheadedness has has intentionally made position changes slowly.    ROS: Please see the history of present illness.    All other systems reviewed and are negative.   Studies Reviewed: .       Cardiac Studies & Procedures   ______________________________________________________________________________________________ CARDIAC CATHETERIZATION  CARDIAC CATHETERIZATION 08/13/2019  Conclusion Conclusions: 1. Significant two-vessel coronary artery disease, including sequential 60% proximal and 70% mid LAD stenosis, which are hemodynamically significant by DFR and also  involve large D2 branch, as well as chronic total occlusion of the mid RCA with bridging and left-to-right collaterals. 2. Mildly elevated left heart and pulmonary artery  pressures.  Prominent v-waves noted on PCWP; question if mitral regurgitation is more severe than appreciated on recent transthoracic echocardiogram. 3. Moderately elevated right heart filling pressures. 4. Normal Fick cardiac output/index.  Recommendations: 1. Outpatient cardiac surgery evaluation, given significant two vessel coronary artery disease including proximal LAD involvement and CTO of the RCA.  If comorbidities pose an excessively high perioperative risk, I would favor maximizing medical therapy.  If the patient has refractory symptoms, PCI to the proximal/mid LAD and D2 could be considered.  I would defer PCI to CTO of the RCA, as this has reportedly been present for 20+ years. 2. Consider gentle diuresis and continued medical management of chronic HFpEF and persistent atrial fibrillation. 3. Aggressive secondary prevention.  Joseph Hanson, MD [facility] Pager: [phone]  Findings Coronary Findings Diagnostic  Dominance: Right  Left Main Vessel is large. Vessel is angiographically normal.  Left Anterior Descending Prox LAD lesion is 60% stenosed. Pressure wire/FFR was performed on the lesion. DFR = 0.87. Mid LAD lesion is 70% stenosed. The lesion is eccentric. Pressure wire/FFR was performed on the lesion. DFR = 0.81.  First Diagonal Branch Vessel is small in size.  Second Diagonal Branch Vessel is large in size.  Left Circumflex Vessel is large. Mid Cx lesion is 20% stenosed.  First Obtuse Marginal Branch Vessel is moderate in size.  Second Obtuse Marginal Branch Vessel is large in size.  Third Obtuse Marginal Branch Vessel is large in size.  Right Coronary Artery Vessel is large. Collaterals Mid RCA filled by collaterals from Prox RCA.  Prox RCA to Mid RCA lesion is 99% stenosed. The lesion is chronically occluded with bridging and left-to-right collateral flow.  Right Posterior Atrioventricular Artery Collaterals RPAV filled by  collaterals from Dist Cx.  Intervention  No interventions have been documented.     ECHOCARDIOGRAM  ECHOCARDIOGRAM COMPLETE 05/23/2024  Narrative ECHOCARDIOGRAM REPORT    Patient Name:   [name] Date of Exam: 05/23/2024 Medical Rec #:  [id number]        Height:       75.0 in Accession #:    [id number]       Weight:       245.0 lb Date of Birth:  30-Oct-1945        BSA:          2.392 m Patient Age:    39 years         BP:           100/52 mmHg Patient Gender: M                HR:           76 bpm. Exam Location:  Outpatient  Procedure: 2D Echo, 3D Echo, Cardiac Doppler and Color Doppler (Both Spectral and Color Flow Doppler were utilized during procedure).  Indications:    CHF  History:        Patient has prior history of Echocardiogram examinations, most recent 09/06/2022. CAD, Arrythmias:Atrial Fibrillation; Risk Factors:Hypertension, Dyslipidemia and Former Smoker. Relapsed large B-cell lymphoma.  Sonographer:    Orvil Holmes RDCS Referring Phys: [id number] Kailen Name S Latoshia Monrroy  IMPRESSIONS   1. Left ventricular ejection fraction, by estimation, is 25 to 30%. The left ventricle has severely decreased function. The left ventricle demonstrates global hypokinesis. The left ventricular internal  cavity size was moderately dilated. Left ventricular diastolic function could not be evaluated. The average left ventricular global longitudinal strain is -7.1 %. The global longitudinal strain is abnormal. 2. Right ventricular systolic function is moderately reduced. The right ventricular size is moderately enlarged. 3. Left atrial size was moderately dilated. 4. Right atrial size was moderately dilated. 5. The mitral valve is normal in structure. Trivial mitral valve regurgitation. No evidence of mitral stenosis. 6. The aortic valve was not well visualized. Aortic valve regurgitation is not visualized. No aortic stenosis is present.  Comparison(s): Prior images reviewed side  by side. Changes from prior study are noted. LV more dilated, EF worse compared to prior study.  FINDINGS Left Ventricle: Left ventricular ejection fraction, by estimation, is 25 to 30%. The left ventricle has severely decreased function. The left ventricle demonstrates global hypokinesis. The average left ventricular global longitudinal strain is -7.1 %. Strain was performed and the global longitudinal strain is abnormal. 3D ejection fraction reviewed and evaluated as part of the interpretation. Alternate measurement of EF is felt to be most reflective of LV function. The left ventricular internal cavity size was moderately dilated. There is no left ventricular hypertrophy. Left ventricular diastolic function could not be evaluated due to atrial fibrillation. Left ventricular diastolic function could not be evaluated.  Right Ventricle: The right ventricular size is moderately enlarged. Right vetricular wall thickness was not well visualized. Right ventricular systolic function is moderately reduced.  Left Atrium: Left atrial size was moderately dilated.  Right Atrium: Right atrial size was moderately dilated.  Pericardium: There is no evidence of pericardial effusion.  Mitral Valve: The mitral valve is normal in structure. Trivial mitral valve regurgitation. No evidence of mitral valve stenosis.  Tricuspid Valve: The tricuspid valve is normal in structure. Tricuspid valve regurgitation is trivial. No evidence of tricuspid stenosis.  Aortic Valve: The aortic valve was not well visualized. Aortic valve regurgitation is not visualized. No aortic stenosis is present.  Pulmonic Valve: The pulmonic valve was not well visualized. Pulmonic valve regurgitation is not visualized. No evidence of pulmonic stenosis.  Aorta: The aortic root, ascending aorta and aortic arch are all structurally normal, with no evidence of dilitation or obstruction.  Venous: The inferior vena cava was not well  visualized.  IAS/Shunts: The atrial septum is grossly normal.  Additional Comments: 3D was performed not requiring image post processing on an independent workstation and was indeterminate.   LEFT VENTRICLE PLAX 2D LVIDd:         6.23 cm      Diastology LVIDs:         5.38 cm      LV e' medial:    7.83 cm/s LV PW:         0.92 cm      LV E/e' medial:  10.8 LV IVS:        0.85 cm      LV e' lateral:   12.20 cm/s LVOT diam:     2.00 cm      LV E/e' lateral: 7.0 LV SV:         63 LV SV Index:   26           2D Longitudinal Strain LVOT Area:     3.14 cm     2D Strain GLS (A4C):   -9.5 % 2D Strain GLS (A3C):   -6.0 % 2D Strain GLS (A2C):   -5.9 % LV Volumes (MOD)  2D Strain GLS Avg:     -7.1 % LV vol d, MOD A2C: 165.0 ml LV vol d, MOD A4C: 133.0 ml LV vol s, MOD A2C: 80.6 ml LV vol s, MOD A4C: 73.3 ml  3D Volume EF: LV SV MOD A2C:     84.4 ml  LV EDV:       190 ml LV SV MOD A4C:     133.0 ml LV ESV:       82 ml LV SV MOD BP:      74.8 ml  LV SV:        108 ml  RIGHT VENTRICLE RV Basal diam:  4.93 cm RV Mid diam:    3.97 cm RV S prime:     9.90 cm/s TAPSE (M-mode): 0.9 cm  LEFT ATRIUM              Index        RIGHT ATRIUM           Index LA diam:        4.90 cm  2.05 cm/m   RA Area:     23.60 cm LA Vol (A2C):   108.0 ml 45.15 ml/m  RA Volume:   72.70 ml  30.39 ml/m LA Vol (A4C):   94.6 ml  39.55 ml/m LA Biplane Vol: 103.0 ml 43.06 ml/m AORTIC VALVE LVOT Vmax:   95.40 cm/s LVOT Vmean:  59.700 cm/s LVOT VTI:    0.199 m  AORTA Ao Root diam: 3.50 cm  MITRAL VALVE MV Area (PHT): 5.13 cm    SHUNTS MV Decel Time: 148 msec    Systemic VTI:  0.20 m MV E velocity: 84.90 cm/s  Systemic Diam: 2.00 cm  Shelda Bruckner MD Electronically signed by Shelda Bruckner MD Signature Date/Time: 05/23/2024/6:50:44 PM    Final          ______________________________________________________________________________________________         Risk  Assessment/Calculations:    CHA2DS2-VASc Score = 4   This indicates a 4.8% annual risk of stroke. The patient's score is based upon: CHF History: 1 HTN History: 1 Diabetes History: 0 Stroke History: 0 Vascular Disease History: 0 Age Score: 2 Gender Score: 0            Physical Exam:   VS:  BP 116/64 (BP Location: Left Arm, Patient Position: Sitting, Cuff Size: Normal)   Pulse 76   Ht 6' 3 (1.905 m)   Wt 259 lb (117.5 kg)   BMI 32.37 kg/m    Wt Readings from Last 3 Encounters:  06/05/24 259 lb (117.5 kg)  05/28/24 254 lb (115.2 kg)  02/02/24 245 lb (111.1 kg)    GEN: Well nourished, well developed in no acute distress NECK: No JVD; No carotid bruits CARDIAC: IRIR, no murmurs, rubs, gallops RESPIRATORY:  Clear to auscultation without rales, wheezing or rhonchi  ABDOMEN: Soft, non-tender, non-distended EXTREMITIES:  No edema; No deformity. R inner ankle with small abrasion, bandaid placed in clinic.   ASSESSMENT AND PLAN: .    Longstanding persistent atrial fibrillation - Xarelto  has been discontinued due to thrombocytosis, hematuria, fall risk. We discussed risk/benefit and he prefers to remain off OAC at this time and verbalizes understanding of CVA risk.  Asymptomatic regarding atrial fibrillation.Rate controlled with heart rate routinely less than 100 bpm at home per his report.  Await ZIO monitor report, presently en route to ZIO. Continue metoprolol  tartrate 25 mg twice daily.    CHA2DS2-VASc Score = 4 [CHF  History: 1, HTN History: 1, Diabetes History: 0, Stroke History: 0, Vascular Disease History: 0, Age Score: 2, Gender Score: 0].  Therefore, the patient's annual risk of stroke is 4.8 %.       B cell lymphoma - On completed Epcoritamab  01/2024 per oncology at [facility].     Recurrent UTI -  Followed with Dr. Fleeta Rothman of ID. Hematuria has been much improved since discontinuation of Xarelto . On daily Keflex .    Hypothyroidism - Continue to follow with PCP.    CAD / HLD, LDL  goal <70 -medically managed moderate to severe CAD. Given further reduced LVEF, plan for [facility]. Delay until 9/22 as his husband is having procedure. Notes stable exertional dyspnea.  GDMT metoprolol  tartrate 25mg  BID, atorvastatin  80mg  daily.  Imdur  previously discontinued due to hypotension.    Systolic and diastolic heart failure - cho 06/2019 LVEF 50-55% ? Echo 08/2022 LVEF 35-40% ? 05/2024 LVEF 25-30%. Gradual weight gain consistent for volume overload. Stable exertional dyspnea. No LE edema though does note abdominal bloating. Continue beta-blocker Metoprolol  tartrate 25mg  BID at present dose. Losartan , imdur  previously discontinued due to hypotension. Will cautiously try Losartan  12.5mg  at bedtime. BMET/BNP in 2 weeks. Low sodium diet, fluid restriction <2L, and daily weights encouraged. Educated to contact our office for weight gain of 2 lbs overnight or 5 lbs in one week.  Will route to HF team for input on any suggested next steps.   Hypertension - BP well controlled. Trial low dose Losartan  12.5mg  at bedtime for HF benefit, as above..  Previous hypotension has resolved.  OSA - prior sleep study >10 years ago. Last used CPAP 2-3 years ago. Interested in trying again. Spouse notes he stops breathing, also some daytime somnolence. Plan for [employer]. STOPBang 5    Informed Consent   Shared Decision Making/Informed Consent The risks [stroke (1 in 1000), death (1 in 1000), kidney failure [usually temporary] (1 in 500), bleeding (1 in 200), allergic reaction [possibly serious] (1 in 200)], benefits (diagnostic support and management of coronary artery disease) and alternatives of a cardiac catheterization were discussed in detail with [name] and he is willing to proceed.        Dispo: follow up in 2-3 weks post LHC  Signed, Reche GORMAN Finder, NP

## 2024-06-05 NOTE — Addendum Note (Signed)
 Addended by: Greysin Medlen S on: 06/05/2024 02:15 PM   Modules accepted: Orders

## 2024-06-06 DIAGNOSIS — I480 Paroxysmal atrial fibrillation: Secondary | ICD-10-CM | POA: Diagnosis not present

## 2024-06-07 NOTE — Telephone Encounter (Unsigned)
 Copied from CRM [id number]. Topic: Clinical - Medication Question >> Jun 07, 2024 11:52 AM Revonda D wrote: Reason for CRM: Tomesha with [facility] is calling to get clarification on the testosterone  enanthate (DELATESTRYL ) 200 MG/ML injection. Tomesha stated that the vials come in 5 ML and the order says . Callback number is [phone]. Fax [phone].

## 2024-06-08 NOTE — Telephone Encounter (Signed)
 Spoke with the pharmacist Vila and was able to give the okay for a Vial of pts medication.

## 2024-06-12 ENCOUNTER — Encounter: Payer: Self-pay | Admitting: Internal Medicine

## 2024-06-12 ENCOUNTER — Telehealth: Payer: Self-pay | Admitting: Internal Medicine

## 2024-06-12 NOTE — Telephone Encounter (Signed)
 Copied from CRM [id number]. Topic: Clinical - Medication Question >> Jun 07, 2024 11:52 AM Revonda D wrote: Reason for CRM: Tomesha with [facility] is calling to get clarification on the testosterone  enanthate (DELATESTRYL ) 200 MG/ML injection. Tomesha stated that the vials come in 5 ML and the order says . Callback number is [phone]. Fax [phone]. >> Jun 12, 2024  1:08 PM Robinson DEL wrote: Patients insurance [employer] needs a prior authorization for the medication testosterone  enanthate (DELATESTRYL ) 200 MG/ML injection not the pharmacy per patients husband, states he was confused at first.  [name] [phone]

## 2024-06-12 NOTE — Telephone Encounter (Signed)
 Copied from CRM #[id number]. Topic: General - Call Back - No Documentation >> Jun 12, 2024 12:32 PM Shereese L wrote: Reason for CRM: patient is returning office call and requesting a call back

## 2024-06-13 ENCOUNTER — Other Ambulatory Visit: Payer: Self-pay | Admitting: Internal Medicine

## 2024-06-13 DIAGNOSIS — E291 Testicular hypofunction: Secondary | ICD-10-CM

## 2024-06-13 DIAGNOSIS — R7989 Other specified abnormal findings of blood chemistry: Secondary | ICD-10-CM

## 2024-06-13 MED ORDER — TESTOSTERONE ENANTHATE 200 MG/ML IM SOLN: 200.0000 mg | INTRAMUSCULAR | 3 refills | Status: AC

## 2024-06-14 NOTE — Telephone Encounter (Signed)
 Copied from CRM #[id number]. Topic: General - Other >> Jun 13, 2024 12:26 PM Jasmin G wrote: Reason for CRM: Staff from [facility] called to check on the status on pt's prior authorization on testosterone  enanthate (DELATESTRYL ) 200 MG/ML injection, I relayed info left en recent Encounter on 9/2. >> Jun 14, 2024  3:49 PM Rosina D wrote: Patient spouse called checking on a prior authorization for medication [facility] prior authorization [id number]

## 2024-06-15 ENCOUNTER — Other Ambulatory Visit (HOSPITAL_COMMUNITY): Payer: Self-pay

## 2024-06-15 ENCOUNTER — Telehealth: Payer: Self-pay

## 2024-06-15 DIAGNOSIS — N1832 Chronic kidney disease, stage 3b: Secondary | ICD-10-CM | POA: Diagnosis not present

## 2024-06-15 DIAGNOSIS — M810 Age-related osteoporosis without current pathological fracture: Secondary | ICD-10-CM | POA: Diagnosis not present

## 2024-06-15 NOTE — Telephone Encounter (Signed)
 Pharmacy Patient Advocate Encounter   Received notification from Pt Calls Messages that prior authorization for Testosterone  enanthate 200mg /ml injection is required/requested.   Insurance verification completed.   The patient is insured through [employer] .   Per test claim: PA required; PA submitted to above mentioned insurance via Phone Key/confirmation #/EOC [id number] Status is pending  Phone # [phone]

## 2024-06-18 ENCOUNTER — Other Ambulatory Visit: Payer: Self-pay | Admitting: Internal Medicine

## 2024-06-18 DIAGNOSIS — E039 Hypothyroidism, unspecified: Secondary | ICD-10-CM

## 2024-06-19 DIAGNOSIS — I25118 Atherosclerotic heart disease of native coronary artery with other forms of angina pectoris: Secondary | ICD-10-CM | POA: Diagnosis not present

## 2024-06-19 DIAGNOSIS — I4821 Permanent atrial fibrillation: Secondary | ICD-10-CM | POA: Diagnosis not present

## 2024-06-19 DIAGNOSIS — I5020 Unspecified systolic (congestive) heart failure: Secondary | ICD-10-CM | POA: Diagnosis not present

## 2024-06-19 DIAGNOSIS — E785 Hyperlipidemia, unspecified: Secondary | ICD-10-CM | POA: Diagnosis not present

## 2024-06-20 DIAGNOSIS — Z23 Encounter for immunization: Secondary | ICD-10-CM | POA: Diagnosis not present

## 2024-06-21 ENCOUNTER — Ambulatory Visit (HOSPITAL_BASED_OUTPATIENT_CLINIC_OR_DEPARTMENT_OTHER): Payer: Self-pay | Admitting: Family

## 2024-06-21 ENCOUNTER — Encounter: Payer: Self-pay | Admitting: Internal Medicine

## 2024-06-21 ENCOUNTER — Encounter: Payer: Self-pay | Admitting: Hematology

## 2024-06-21 DIAGNOSIS — E875 Hyperkalemia: Secondary | ICD-10-CM | POA: Diagnosis not present

## 2024-06-21 DIAGNOSIS — N1832 Chronic kidney disease, stage 3b: Secondary | ICD-10-CM | POA: Diagnosis not present

## 2024-06-21 LAB — CBC
Hematocrit: 49.8 % (ref 37.5–51.0)
Hemoglobin: 17 g/dL (ref 13.0–17.7)
MCH: 34.7 pg — ABNORMAL HIGH (ref 26.6–33.0)
MCHC: 34.1 g/dL (ref 31.5–35.7)
MCV: 102 fL — ABNORMAL HIGH (ref 79–97)
Platelets: 154 x10E3/uL (ref 150–450)
RBC: 4.9 x10E6/uL (ref 4.14–5.80)
RDW: 13.6 % (ref 11.6–15.4)
WBC: 7.8 x10E3/uL (ref 3.4–10.8)

## 2024-06-21 LAB — BASIC METABOLIC PANEL WITH GFR
BUN/Creatinine Ratio: 19 (ref 10–24)
BUN: 36 mg/dL — ABNORMAL HIGH (ref 8–27)
CO2: 25 mmol/L (ref 20–29)
Calcium: 9.3 mg/dL (ref 8.6–10.2)
Chloride: 98 mmol/L (ref 96–106)
Creatinine, Ser: 1.9 mg/dL — ABNORMAL HIGH (ref 0.76–1.27)
Glucose: 61 mg/dL — ABNORMAL LOW (ref 70–99)
Potassium: 5.2 mmol/L (ref 3.5–5.2)
Sodium: 138 mmol/L (ref 134–144)
eGFR: 36 mL/min/1.73 — ABNORMAL LOW (ref >59)

## 2024-06-21 LAB — BRAIN NATRIURETIC PEPTIDE: BNP: 88.4 pg/mL (ref 0.0–100.0)

## 2024-06-22 ENCOUNTER — Other Ambulatory Visit (HOSPITAL_COMMUNITY): Payer: Self-pay

## 2024-06-22 NOTE — Telephone Encounter (Signed)
 Pharmacy Patient Advocate Encounter  Received notification from [employer] that Prior Authorization for Testosterone  Enan 1000mg /66ml has been DENIED.  Full denial letter will be uploaded to the media tab. See denial reason below.

## 2024-06-25 DIAGNOSIS — M810 Age-related osteoporosis without current pathological fracture: Secondary | ICD-10-CM | POA: Diagnosis not present

## 2024-06-27 ENCOUNTER — Ambulatory Visit (HOSPITAL_COMMUNITY)
Admission: RE | Admit: 2024-06-27 | Discharge: 2024-06-27 | Disposition: A | Discharge status: Home / Self Care | Source: Ambulatory Visit | Attending: Internal Medicine | Admitting: Internal Medicine

## 2024-06-27 ENCOUNTER — Encounter (HOSPITAL_COMMUNITY): Payer: Self-pay | Admitting: Internal Medicine

## 2024-06-27 VITALS — BP 118/70 | HR 77 | Wt 259.0 lb

## 2024-06-27 DIAGNOSIS — G4733 Obstructive sleep apnea (adult) (pediatric): Secondary | ICD-10-CM | POA: Diagnosis not present

## 2024-06-27 DIAGNOSIS — I444 Left anterior fascicular block: Secondary | ICD-10-CM | POA: Insufficient documentation

## 2024-06-27 DIAGNOSIS — E875 Hyperkalemia: Secondary | ICD-10-CM | POA: Diagnosis not present

## 2024-06-27 DIAGNOSIS — I5022 Chronic systolic (congestive) heart failure: Secondary | ICD-10-CM | POA: Diagnosis not present

## 2024-06-27 DIAGNOSIS — C833A Diffuse large b-cell lymphoma, in remission: Secondary | ICD-10-CM | POA: Diagnosis not present

## 2024-06-27 DIAGNOSIS — Z87440 Personal history of urinary (tract) infections: Secondary | ICD-10-CM | POA: Diagnosis not present

## 2024-06-27 DIAGNOSIS — Z79899 Other long term (current) drug therapy: Secondary | ICD-10-CM | POA: Diagnosis not present

## 2024-06-27 DIAGNOSIS — I4821 Permanent atrial fibrillation: Secondary | ICD-10-CM

## 2024-06-27 DIAGNOSIS — Z87891 Personal history of nicotine dependence: Secondary | ICD-10-CM | POA: Diagnosis not present

## 2024-06-27 DIAGNOSIS — I2510 Atherosclerotic heart disease of native coronary artery without angina pectoris: Secondary | ICD-10-CM | POA: Insufficient documentation

## 2024-06-27 DIAGNOSIS — I110 Hypertensive heart disease with heart failure: Secondary | ICD-10-CM | POA: Insufficient documentation

## 2024-06-27 DIAGNOSIS — Z9221 Personal history of antineoplastic chemotherapy: Secondary | ICD-10-CM | POA: Insufficient documentation

## 2024-06-27 DIAGNOSIS — I4819 Other persistent atrial fibrillation: Secondary | ICD-10-CM | POA: Insufficient documentation

## 2024-06-27 NOTE — Progress Notes (Signed)
 ADV[facility]ED [facility] CLINIC CONSULT NOTE  Referring Physician: Garald Karlynn GAILS, MD Primary Care: Plotnikov, Karlynn GAILS, MD Primary Cardiologist: Annabella Scarce, MD  Chief Complaint:  HF  HPI:  Mr. Buckwalter is a 78 y.o. male with a hx of relapsed large B-cell lymphoma, CAD, recurrent UTI, atrial fibrillation,OSA and systolic HF  Initially diagnosed atrial fibrillation around 201. Amiodarone  discontinued due to persistent atrial fibrillation.  Underwent LHC  with reported 90% obstructive of unknown vessel that developed collaterals.  He was started on Ranexa .  Echo 10/2016 EF 60 to 65%, severe bilateral atrial enlargement, IVC dilated.  He had no angina so Ranexa  discontinued.   Diagnosed with diffuse large B-cell lymphoma 05/2018.  Completed 6 cycles of R-CHOP chemotherapy.  Based on echo 06/2018 EF 50 to 55%, normal diastolic function, apical hypokinesis.  LHC 08/2019 significant two-vessel CAD sequential 60 to 70% mid LAD lesions and CTO of mid RCA with bridging left-to-right collaterals.  Consideration for two-vessel CABG versus medical management.  Deemed not candidate for surgery.     03/22/22 chemo resumed due to recurrence of diffuse large b-cell lymphoma. Treated with R-GDP   Admitted 04/2022 with urosepsis due to E. coli ESBL.   Echo 08/2022 LVEF 35-40%   He has been off Xarelto  since 10/18/22 due to hematuria, frequent falls as risks outweighed benefits.    At visit 12/09/23 he was undergoing Epcoritamab  bispecific for refractory DLBCL with [facility] oncology.  Updated echo 05/23/24 reduced LVEF 25-30% (previously 35-40%), moderate RV dysfunction. He was recommended for 3 day ZIO to assess rate control of atrial fibrillation.   Zio 8/25 chronic AF avg 77bpm  Presents today for follow up with his husband. Says he is felt to be in remission with his lymphoma. Says he feels fine. Will get SOB if he hurries or walks more than 50-100 feet. No CP. No edema, orthopnea or PND. Has been intolerant of HF  meds d/t low BP. Recently saw [facility]inder and losartan  12.5 which he has tolerated. Snores excessively. Pending R/L cath next week   Past Medical History:  Diagnosis Date   Benign localized prostatic hyperplasia with lower urinary tract symptoms (LUTS)    CAD (coronary artery disease) cardiologist--  dr t. scarce   hx of known CAD obstruction w/ collaterals (cath done @ [facility] 12-31-2011) ;   last cardiac cath 08-13-2019  showed sig. 2V CAD involving proxLAD and CTO of the RCA/  chronic total occlusion midRCA w/ bridging and L>R collaterals   Diastolic CHF (HCC)    dx 06-15-2019 hospital admission (followed by pcp)   Diffuse large B cell lymphoma [facility]) oncologist-- dr onesimo--- in remission   dx 08/ 2019 -- bx left tonsill mass-- involving lymph nodes--- completed chemotherapy 10-12-2018   DJD (degenerative joint disease)    Dysthymic disorder    Environmental allergies    GAD (generalized anxiety disorder)    with dysthymia   GERD (gastroesophageal reflux disease)    History of falling    multiple times   History of kidney stones    HLD (hyperlipidemia)    Hyperlipidemia    Hypertension    Hypogonadism in male    OA (osteoarthritis)    OSA on CPAP    cpap machine-settings 17   Osteoporosis with fracture 04/24/2013   Persistent atrial fibrillation (HCC)    On Xarelto , cardiologist--- dr jonelle. [place]   Plaque psoriasis    dermatologist--- dr ryan gammon--- currently taking otezla    Post-surgical hypothyroidism    followed  by pcp---  s/p total thyroidectomy for graves disease in 1987   Wears hearing aid in both ears     Current Outpatient Medications  Medication Sig Dispense Refill   acetaminophen  (TYLENOL ) 325 MG tablet Take 2 tablets (650 mg total) by mouth every 6 (six) hours as needed for mild pain (or Fever >/= 101). 20 tablet    acyclovir  (ZOVIRAX ) 400 MG tablet Take 1 tablet (400 mg total) by mouth 2 (two) times daily. 180 tablet 3   albuterol  (VENTOLIN  HFA) 108  (90 Base) MCG/ACT inhaler Inhale 2 puffs into the lungs every 6 (six) hours as needed for wheezing or shortness of breath.     alfuzosin  (UROXATRAL ) 10 MG 24 hr tablet TAKE 1 TABLET AT BEDTIME 90 tablet 3   allopurinol  (ZYLOPRIM ) 100 MG tablet TAKE 1 TABLET EVERY DAY 90 tablet 3   amoxicillin  (AMOXIL ) 500 MG capsule Take 2,000 mg by mouth See admin instructions. TAKE PRIOR TO DENTAL PROCEDURES     atorvastatin  (LIPITOR ) 80 MG tablet Take 1 tablet (80 mg total) by mouth daily. 90 tablet 3   buPROPion  (WELLBUTRIN  XL) 300 MG 24 hr tablet TAKE 1 TABLET EVERY DAY WITH BREAKFAST 90 tablet 3   Calcium  Citrate-Vitamin D  (CALCIUM  CITRATE + D PO) Take 1 tablet by mouth 2 (two) times daily. 1200mg  of Calcium  and 1000 units vitamin D3     cephALEXin  (KEFLEX ) 250 MG capsule Take 1 capsule (250 mg total) by mouth at bedtime.     Cholecalciferol  (VITAMIN D ) 50 MCG (2000 UT) tablet Take 4,000 Units by mouth in the morning and at bedtime.     clonazePAM  (KLONOPIN ) 1 MG tablet TAKE 1 TABLET THREE TIMES DAILY AS NEEDED FOR ANXIETY 90 tablet 3   CRANBERRY PO Take 1 tablet by mouth in the morning and at bedtime.     denosumab  (PROLIA ) 60 MG/ML SOSY injection Inject 60 mg as directed every 6 (six) months.     DULoxetine  (CYMBALTA ) 60 MG capsule TAKE 1 CAPSULE TWICE DAILY 180 capsule 3   EPINEPHrine  0.3 mg/0.3 mL IJ SOAJ injection Inject 0.3 mg into the muscle once as needed for anaphylaxis (severe allergic reaction).     ezetimibe  (ZETIA ) 10 MG tablet Take 1 tablet (10 mg total) by mouth daily. 90 tablet 3   ferrous sulfate  325 (65 FE) MG tablet Take 325 mg by mouth 2 (two) times daily with a meal.     finasteride (PROSCAR) 5 MG tablet Take 5 mg by mouth daily.     levothyroxine  (SYNTHROID ) 125 MCG tablet Take 1 tablet (125 mcg total) by mouth daily at 6 (six) AM. 90 tablet 3   loratadine  (CLARITIN ) 10 MG tablet Take 10 mg by mouth daily.     losartan  (COZAAR ) 25 MG tablet Take 0.5 tablets (12.5 mg total) by mouth  every evening. 45 tablet 3   metoprolol  tartrate (LOPRESSOR ) 25 MG tablet Take 1 tablet (25 mg total) by mouth 2 (two) times daily. 180 tablet 3   Multiple Vitamin (MULTIVITAMIN WITH MINERALS) TABS tablet Take 1 tablet by mouth every morning. Men's One-A-Day 50+     MYRBETRIQ  25 MG TB24 tablet TAKE 1 TABLET EVERY DAY 90 tablet 3   nitroGLYCERIN  (NITROSTAT ) 0.4 MG SL tablet Place 0.4 mg under the tongue every 5 (five) minutes as needed for chest pain.     omeprazole  (PRILOSEC) 40 MG capsule TAKE 1 CAPSULE EVERY DAY BEFORE BREAKFAST 90 capsule 3   Potassium Citrate 15 MEQ (1620 MG)  TBCR TAKE 1 TABLET IN THE MORNING AND AT BEDTIME 180 tablet 3   Probiotic Product (PROBIOTIC PO) Take 1 capsule by mouth daily with breakfast.     SKYRIZI PEN 150 MG/ML pen Inject 150 mg into the skin every 3 (three) months.     solifenacin (VESICARE) 10 MG tablet Take 10 mg by mouth daily.     tamsulosin  (FLOMAX ) 0.4 MG CAPS capsule Take 1 capsule by mouth daily.     testosterone  enanthate (DELATESTRYL ) 200 MG/ML injection Inject 1 mL (200 mg total) into the muscle every 14 (fourteen) days. Every other Friday 5 mL 3   vitamin C  (ASCORBIC ACID ) 500 MG tablet Take 500 mg by mouth daily with lunch.     No current facility-administered medications for this encounter.    Allergies  Allergen Reactions   Short Ragweed Pollen Ext Cough      Social History   Socioeconomic History   Marital status: Married    Spouse name: [name]   Number of children: Not on file   Years of education: Not on file   Highest education level: Master's degree (e.g., MA, MS, MEng, MEd, MSW, MBA)  Occupational History   Occupation: RETIRED  Tobacco Use   Smoking status: Former    Current packs/day: 0.00    Average packs/day: 1 pack/day for 50.0 years (50.0 ttl pk-yrs)    Types: Cigarettes    Start date: 01/11/1962    Quit date: 01/12/2012    Years since quitting: 12.4   Smokeless tobacco: Never  Vaping Use   Vaping status: Never Used   Substance and Sexual Activity   Alcohol  use: Yes    Comment: rarely   Drug use: Never   Sexual activity: Yes  Other Topics Concern   Not on file  Social History Narrative   Lives In home with spouse [name]      Social Drivers of Health   Financial Resource Strain: Low Risk  (05/24/2024)   Overall Financial Resource Strain (CARDIA)    Difficulty of Paying Living Expenses: Not hard at all  Food Insecurity: No Food Insecurity (05/24/2024)   Hunger Vital Sign    Worried About Running Out of Food in the Last Year: Never true    Ran Out of Food in the Last Year: Never true  Transportation Needs: No Transportation Needs (05/24/2024)   PRAPARE - Administrator, Civil Service (Medical): No    Lack of Transportation (Non-Medical): No  Physical Activity: Inactive (05/24/2024)   Exercise Vital Sign    Days of Exercise per Week: 0 days    Minutes of Exercise per Session: Not on file  Stress: Stress Concern Present (05/24/2024)   [employer] of Occupational Health - Occupational Stress Questionnaire    Feeling of Stress: To some extent  Social Connections: Moderately Isolated (05/24/2024)   Social Connection and Isolation Panel    Frequency of Communication with Friends and Family: Never    Frequency of Social Gatherings with Friends and Family: Never    Attends Religious Services: More than 4 times per year    Active Member of [employer] or Organizations: No    Attends Banker Meetings: Not on file    Marital Status: Married  Catering manager Violence: Not At Risk (02/02/2024)   Humiliation, Afraid, Rape, and Kick questionnaire    Fear of Current or Ex-Partner: No    Emotionally Abused: No    Physically Abused: No    Sexually Abused: No  Family History  Problem Relation Age of Onset   CAD Father 68   Asthma Father    Alcohol  abuse Father    Arthritis Mother    Alcohol  abuse Sister     Vitals:   06/27/24 1026  BP: 118/70  Pulse: 77  SpO2: 98%   Weight: 117.5 kg (259 lb)    PHYSICAL EXAM: General:  obese No respiratory difficulty HEENT: normal Neck: supple. no JVD. Carotids 2+ bilat; no bruits. No lymphadenopathy or thryomegaly appreciated. Cor: PMI nondisplaced.Irreg. No rubs, gallops or murmurs. Lungs: clear Abdomen: obese soft, nontender, nondistended. No hepatosplenomegaly. No bruits or masses. Good bowel sounds. Extremities: no cyanosis, clubbing, rash, edema Neuro: alert & oriented x 3, cranial nerves grossly intact. moves all 4 extremities w/o difficulty. Affect pleasant.  ECG: AF 79 LAFB Personally reviewed    ASSESSMENT & PLAN:  1. Chronic systolic HF - Echo 06/2019 LVEF 50-55% ? Echo 08/2022 LVEF 35-40% ? 05/2024 LVEF 25-30%.  - Cath 2020 with 2v CAD -> medical RX. EF normal at time - Has plan for cath next week to look for progressive CAD (agree with plan) - Suspect possible component of chemotoxicity (R-CHOP) - NYHA II - Volume ok - GDMT limited by frequent UTIs (no SGLT2i) and hyperkalemia (no spiro) - Continue losartan  12.5 daily (will not titrate today with recent K 5.3) -> may need Lokelma to help titrate - Continue metoprolol  25 bid (will switch to Toprol  at next visit after cath) - Plan cMRI  2. Chronic AF - Zio 8/25 - avg rate 77 (adequate rate control) - Off AC duet o bleeds   3. Recurrent B cell lymphoma  - s/p R-CHOP 2019 - s/p RGDP 2023 - recently treated with Epcoritamab    4. CAD - 2v CAD on cath 2020 - repeat cath next week with Dr. Mady due to dropping EF - no current angina - management per Gen Cards   5. OSA  - prior sleep study >10 years ago. Last used CPAP 2-3 years ago. Interested in trying again. - Spouse notes he stops breathing, also some daytime somnolence. - STOPBang 5 - Plan Itamar study  Toribio Fuel, MD  10:52 AM

## 2024-06-27 NOTE — Patient Instructions (Signed)
 Medication Changes:  None, continue current medications  Testing/Procedures:  Your provider has recommended that you have a home sleep study (Itamar Test).  We have provided you with the equipment in our office today. Please go ahead and download the app. Your PIN number is [id number]. Once you have completed the test you just dispose of the equipment, the information is automatically uploaded to us  via blue-tooth technology. If your test is positive for sleep apnea and you need a home CPAP machine you will be contacted by Dr Dorine office [facility]) to set this up.  Your physician has requested that you have a cardiac MRI. Cardiac MRI uses a computer to create images of your heart as its beating, producing both still and moving pictures of your heart and major blood vessels. Please follow the instructions below  Special Instructions // Education:  Do the following things EVERYDAY: Weigh yourself in the morning before breakfast. Write it down and keep it in a log. Take your medicines as prescribed Eat low salt foods--Limit salt (sodium) to 2000 mg per day.  Stay as active as you can everyday Limit all fluids for the day to less than 2 liters     You are scheduled for Cardiac MRI at the location below.  YOU WILL BE CALLED TO SCHEDULE THIS.?  [facility] [address] [place], [place] [address] Please take advantage of the free valet parking available at the [facility] and [employer] (Entrance C).  Proceed to the [facility] Radiology Department (First Floor) for check-in.    Magnetic resonance imaging (MRI) is a painless test that produces images of the inside of the body without using Xrays.  During an MRI, strong magnets and radio waves work together in a Data processing manager to form detailed images.   MRI images may provide more details about a medical condition than X-rays, CT scans, and ultrasounds can provide.  You may be given earphones to listen for  instructions.  You may eat a light breakfast and take medications as ordered with the exception of furosemide , hydrochlorothiazide , chlorthalidone or spironolactone (or any other fluid pill). If you are undergoing a stress MRI, please avoid stimulants for 12 hr prior to test. (I.e. Caffeine, nicotine , chocolate, or antihistamine medications)  If your provider has ordered anti-anxiety medications for this test, then you will need a driver.  An IV will be inserted into one of your veins. Contrast material will be injected into your IV. It will leave your body through your urine within a day. You may be told to drink plenty of fluids to help flush the contrast material out of your system.  You will be asked to remove all metal, including: Watch, jewelry, and other metal objects including hearing aids, hair pieces and dentures. Also wearable glucose monitoring systems (ie. Freestyle Libre and Omnipods) (Braces and fillings normally are not a problem.)   TEST WILL TAKE APPROXIMATELY 1 HOUR  PLEASE NOTIFY SCHEDULING AT LEAST 24 HOURS IN ADVANCE IF YOU ARE UNABLE TO KEEP YOUR APPOINTMENT. [phone]  For more information and frequently asked questions, please visit our website : [email or web address]  Please call the Cardiac Imaging Nurse Navigators with any questions/concerns. [phone] Office    Follow-Up in: 6-8 WEEKS   At the [facility], you and your health needs are our priority. We have a designated team specialized in the treatment of Heart Failure. This Care Team includes your primary Heart Failure Specialized Cardiologist (physician), Advanced Practice Providers (APPs- Physician Assistants  and Nurse Practitioners), and Pharmacist who all work together to provide you with the care you need, when you need it.   You may see any of the following providers on your designated Care Team at your next follow up:  Dr. Toribio Fuel Dr. Ezra Shuck Dr.  Ria Commander Dr. Odis Brownie Greig Mosses, NP Caffie Shed, [place] [facility] [place], [place] Beckey Coe, NP [place] Lee, NP Tinnie Redman, PharmD   Please be sure to bring in all your medications bottles to every appointment.   Need to Contact Us :  If you have any questions or concerns before your next appointment please send us  a message through [facility] or call our office at [phone].    TO LEAVE A MESSAGE FOR THE NURSE SELECT OPTION 2, PLEASE LEAVE A MESSAGE INCLUDING: YOUR NAME DATE OF BIRTH CALL BACK NUMBER REASON FOR CALL**this is important as we prioritize the call backs  YOU WILL RECEIVE A CALL BACK THE SAME DAY AS LONG AS YOU CALL BEFORE 4:00 PM

## 2024-06-28 ENCOUNTER — Telehealth: Payer: Self-pay | Admitting: *Deleted

## 2024-06-28 NOTE — Telephone Encounter (Signed)
 Per Dr End-IV flow rate for hydration 50 ml/hr for 4 hours.  I reviewed procedure instructions/pre-procedure hydration with patient.

## 2024-06-28 NOTE — Telephone Encounter (Signed)
 Patient returned RN's call.

## 2024-06-28 NOTE — Telephone Encounter (Addendum)
 Cardiac Catheterization scheduled at [facility] for: Monday July 02, 2024 11 AM Arrival time [facility] Main Entrance A at: 6 AM -pre-procedure hydration Per Dr End-IV flow rate for hydration 50 ml/hr for 4 hours.  Diet: -Nothing to eat after midnight.  Hydration: -May drink clear liquids until 2 hours before the procedure.  Approved liquids: Water, clear tea, black coffee, fruit juices-non-citric and without pulp,Gatorade, plain Jello/popsicles.  Medication instructions: -Hold:  Losartan -takes in the evening-will hold evening prior to procedure-per protocol GFR < 60 (36) -Other usual morning medications can be taken including aspirin  81 mg.  Plan to go home the same day, you will only stay overnight if medically necessary.  You must have responsible adult to drive you home.  Someone must be with you the first 24 hours after you arrive home.  Reviewed procedure instructions/pre-procedure hydration with patient.

## 2024-07-02 ENCOUNTER — Ambulatory Visit (HOSPITAL_COMMUNITY)
Admission: RE | Admit: 2024-07-02 | Discharge: 2024-07-02 | Disposition: A | Discharge status: Home / Self Care | Attending: Internal Medicine | Admitting: Internal Medicine

## 2024-07-02 ENCOUNTER — Encounter (HOSPITAL_COMMUNITY)
Admission: RE | Disposition: A | Discharge status: Home / Self Care | Payer: Self-pay | Source: Home / Self Care | Attending: Internal Medicine

## 2024-07-02 ENCOUNTER — Other Ambulatory Visit: Payer: Self-pay

## 2024-07-02 DIAGNOSIS — I25118 Atherosclerotic heart disease of native coronary artery with other forms of angina pectoris: Secondary | ICD-10-CM

## 2024-07-02 DIAGNOSIS — Z87891 Personal history of nicotine dependence: Secondary | ICD-10-CM | POA: Insufficient documentation

## 2024-07-02 DIAGNOSIS — I272 Pulmonary hypertension, unspecified: Secondary | ICD-10-CM | POA: Diagnosis not present

## 2024-07-02 DIAGNOSIS — I2510 Atherosclerotic heart disease of native coronary artery without angina pectoris: Secondary | ICD-10-CM

## 2024-07-02 DIAGNOSIS — G4733 Obstructive sleep apnea (adult) (pediatric): Secondary | ICD-10-CM | POA: Insufficient documentation

## 2024-07-02 DIAGNOSIS — E785 Hyperlipidemia, unspecified: Secondary | ICD-10-CM | POA: Diagnosis not present

## 2024-07-02 DIAGNOSIS — I4811 Longstanding persistent atrial fibrillation: Secondary | ICD-10-CM | POA: Diagnosis not present

## 2024-07-02 DIAGNOSIS — N1832 Chronic kidney disease, stage 3b: Secondary | ICD-10-CM | POA: Insufficient documentation

## 2024-07-02 DIAGNOSIS — C8330 Diffuse large B-cell lymphoma, unspecified site: Secondary | ICD-10-CM | POA: Insufficient documentation

## 2024-07-02 DIAGNOSIS — E039 Hypothyroidism, unspecified: Secondary | ICD-10-CM | POA: Insufficient documentation

## 2024-07-02 DIAGNOSIS — Z87440 Personal history of urinary (tract) infections: Secondary | ICD-10-CM | POA: Insufficient documentation

## 2024-07-02 DIAGNOSIS — Z7901 Long term (current) use of anticoagulants: Secondary | ICD-10-CM | POA: Insufficient documentation

## 2024-07-02 DIAGNOSIS — I130 Hypertensive heart and chronic kidney disease with heart failure and stage 1 through stage 4 chronic kidney disease, or unspecified chronic kidney disease: Secondary | ICD-10-CM | POA: Insufficient documentation

## 2024-07-02 DIAGNOSIS — I5040 Unspecified combined systolic (congestive) and diastolic (congestive) heart failure: Secondary | ICD-10-CM | POA: Diagnosis not present

## 2024-07-02 DIAGNOSIS — I5020 Unspecified systolic (congestive) heart failure: Secondary | ICD-10-CM

## 2024-07-02 HISTORY — PX: CORONARY PRESSURE/FFR WITH 3D MAPPING: CATH118309

## 2024-07-02 HISTORY — PX: RIGHT/LEFT HEART CATH AND CORONARY ANGIOGRAPHY: CATH118266

## 2024-07-02 LAB — POCT I-STAT EG7
Acid-Base Excess: 0 mmol/L (ref 0.0–2.0)
Bicarbonate: 27 mmol/L (ref 20.0–28.0)
Calcium, Ion: 1.14 mmol/L — ABNORMAL LOW (ref 1.15–1.40)
HCT: 45 % (ref 39.0–52.0)
Hemoglobin: 15.3 g/dL (ref 13.0–17.0)
O2 Saturation: 69 %
Potassium: 4.1 mmol/L (ref 3.5–5.1)
Sodium: 140 mmol/L (ref 135–145)
TCO2: 29 mmol/L (ref 22–32)
pCO2, Ven: 50.5 mmHg (ref 44–60)
pH, Ven: 7.337 (ref 7.25–7.43)
pO2, Ven: 39 mmHg (ref 32–45)

## 2024-07-02 LAB — POCT I-STAT 7, (LYTES, BLD GAS, ICA,H+H)
Acid-Base Excess: 0 mmol/L (ref 0.0–2.0)
Bicarbonate: 26.5 mmol/L (ref 20.0–28.0)
Calcium, Ion: 1.19 mmol/L (ref 1.15–1.40)
HCT: 45 % (ref 39.0–52.0)
Hemoglobin: 15.3 g/dL (ref 13.0–17.0)
O2 Saturation: 97 %
Potassium: 4.2 mmol/L (ref 3.5–5.1)
Sodium: 139 mmol/L (ref 135–145)
TCO2: 28 mmol/L (ref 22–32)
pCO2 arterial: 47.8 mmHg (ref 32–48)
pH, Arterial: 7.352 (ref 7.35–7.45)
pO2, Arterial: 92 mmHg (ref 83–108)

## 2024-07-02 SURGERY — RIGHT/LEFT HEART CATH AND CORONARY ANGIOGRAPHY
Anesthesia: LOCAL

## 2024-07-02 MED ORDER — ASPIRIN 81 MG PO CHEW: 81.0000 mg | CHEWABLE_TABLET | ORAL | Status: DC

## 2024-07-02 MED ORDER — ACETAMINOPHEN 325 MG PO TABS: 650.0000 mg | ORAL_TABLET | ORAL | Status: DC | PRN

## 2024-07-02 MED ORDER — HYDRALAZINE HCL 20 MG/ML IJ SOLN: 10.0000 mg | INTRAMUSCULAR | Status: DC | PRN

## 2024-07-02 MED ORDER — LIDOCAINE HCL (PF) 1 % IJ SOLN
INTRAMUSCULAR | Status: AC
Start: 2024-07-02 — End: 2024-07-02
  Filled 2024-07-02: qty 30

## 2024-07-02 MED ORDER — MIDAZOLAM HCL 2 MG/2ML IJ SOLN
INTRAMUSCULAR | Status: DC | PRN
  Administered 2024-07-02: 1 mg via INTRAVENOUS

## 2024-07-02 MED ORDER — MIDAZOLAM HCL 2 MG/2ML IJ SOLN
INTRAMUSCULAR | Status: AC
  Filled 2024-07-02: qty 2

## 2024-07-02 MED ORDER — LABETALOL HCL 5 MG/ML IV SOLN: 10.0000 mg | INTRAVENOUS | Status: DC | PRN

## 2024-07-02 MED ORDER — SODIUM CHLORIDE 0.9 % IV SOLN: INTRAVENOUS | Status: DC

## 2024-07-02 MED ORDER — FENTANYL CITRATE (PF) 100 MCG/2ML IJ SOLN
INTRAMUSCULAR | Status: AC
  Filled 2024-07-02: qty 2

## 2024-07-02 MED ORDER — FENTANYL CITRATE (PF) 100 MCG/2ML IJ SOLN
INTRAMUSCULAR | Status: DC | PRN
  Administered 2024-07-02: 25 ug via INTRAVENOUS

## 2024-07-02 MED ORDER — VERAPAMIL HCL 2.5 MG/ML IV SOLN
INTRAVENOUS | Status: AC
  Filled 2024-07-02: qty 2

## 2024-07-02 MED ORDER — SODIUM CHLORIDE 0.9 % IV SOLN: 250.0000 mL | INTRAVENOUS | Status: DC | PRN

## 2024-07-02 MED ORDER — LIDOCAINE HCL (PF) 1 % IJ SOLN
INTRAMUSCULAR | Status: DC | PRN
  Administered 2024-07-02: 2 mL via INTRADERMAL

## 2024-07-02 MED ORDER — HEPARIN SODIUM (PORCINE) 1000 UNIT/ML IJ SOLN
INTRAMUSCULAR | Status: DC | PRN
  Administered 2024-07-02: 5000 [IU] via INTRAVENOUS

## 2024-07-02 MED ORDER — SODIUM CHLORIDE 0.9% FLUSH: 3.0000 mL | Freq: Two times a day (BID) | INTRAVENOUS | Status: DC

## 2024-07-02 MED ORDER — HEPARIN (PORCINE) IN NACL 1000-0.9 UT/500ML-% IV SOLN
INTRAVENOUS | Status: DC | PRN
  Administered 2024-07-02 (×3): 500 mL

## 2024-07-02 MED ORDER — HEPARIN SODIUM (PORCINE) 1000 UNIT/ML IJ SOLN
INTRAMUSCULAR | Status: AC
  Filled 2024-07-02: qty 10

## 2024-07-02 MED ORDER — ONDANSETRON HCL 4 MG/2ML IJ SOLN: 4.0000 mg | Freq: Four times a day (QID) | INTRAMUSCULAR | Status: DC | PRN

## 2024-07-02 MED ORDER — VERAPAMIL HCL 2.5 MG/ML IV SOLN
INTRAVENOUS | Status: DC | PRN
  Administered 2024-07-02: 10 mL via INTRA_ARTERIAL

## 2024-07-02 MED ORDER — SODIUM CHLORIDE 0.9% FLUSH: 3.0000 mL | INTRAVENOUS | Status: DC | PRN

## 2024-07-02 MED ORDER — IOHEXOL 350 MG/ML SOLN
INTRAVENOUS | Status: DC | PRN
  Administered 2024-07-02: 30 mL

## 2024-07-02 SURGICAL SUPPLY — 12 items
CARD KEY FFR CATHWORX (MISCELLANEOUS) IMPLANT
CATH 5FR JL3.5 JR4 ANG PIG MP (CATHETERS) IMPLANT
CATH BALLN WEDGE 5F 110CM (CATHETERS) IMPLANT
DEVICE RAD COMP TR BAND LRG (VASCULAR PRODUCTS) IMPLANT
GLIDESHEATH SLEND A-KIT 6F 22G (SHEATH) IMPLANT
GLIDESHEATH SLEND SS 6F .021 (SHEATH) IMPLANT
GUIDEWIRE .025 260CM (WIRE) IMPLANT
GUIDEWIRE INQWIRE 1.5J.035X260 (WIRE) IMPLANT
KIT SINGLE USE MANIFOLD (KITS) IMPLANT
PACK CARDIAC CATHETERIZATION (CUSTOM PROCEDURE TRAY) ×2 IMPLANT
SET ATX-X65L (MISCELLANEOUS) IMPLANT
SHEATH GLIDE SLENDER 4/5FR (SHEATH) IMPLANT

## 2024-07-02 NOTE — Progress Notes (Signed)
 Patient and husband was given discharge instructions. Both verbalized understanding.

## 2024-07-02 NOTE — Discharge Instructions (Signed)

## 2024-07-02 NOTE — Interval H&P Note (Signed)
 History and Physical Interval Note:  07/02/2024 12:59 PM  GUTHRIE LEMME  has presented today for surgery, with the diagnosis of HFrEF and coronary artery disease.  The various methods of treatment have been discussed with the patient and family. After consideration of risks, benefits and other options for treatment, the patient has consented to  Procedure(s): RIGHT/LEFT HEART CATH AND CORONARY ANGIOGRAPHY (N/A) as a surgical intervention.  The patient's history has been reviewed, patient examined, no change in status, stable for surgery.  I have reviewed the patient's chart and labs.  Questions were answered to the patient's satisfaction.    Cath Lab Visit (complete for each Cath Lab visit)  Clinical Evaluation Leading to the Procedure:   ACS: No.  Non-ACS:    Anginal Classification: CCS I  Anti-ischemic medical therapy: Minimal Therapy (1 class of medications)  Non-Invasive Test Results: LVEF 25-30% by echo -> high risk  Prior CABG: No previous CABG  Kino Dunsworth

## 2024-07-03 ENCOUNTER — Encounter (HOSPITAL_COMMUNITY): Payer: Self-pay | Admitting: Internal Medicine

## 2024-07-03 ENCOUNTER — Ambulatory Visit (HOSPITAL_BASED_OUTPATIENT_CLINIC_OR_DEPARTMENT_OTHER): Admitting: Family

## 2024-07-05 ENCOUNTER — Other Ambulatory Visit: Payer: Self-pay | Admitting: Internal Medicine

## 2024-07-05 DIAGNOSIS — E039 Hypothyroidism, unspecified: Secondary | ICD-10-CM

## 2024-07-06 ENCOUNTER — Ambulatory Visit (HOSPITAL_BASED_OUTPATIENT_CLINIC_OR_DEPARTMENT_OTHER): Payer: Self-pay | Admitting: Family

## 2024-07-06 DIAGNOSIS — I480 Paroxysmal atrial fibrillation: Secondary | ICD-10-CM | POA: Diagnosis not present

## 2024-07-16 ENCOUNTER — Other Ambulatory Visit: Payer: Self-pay

## 2024-07-16 MED ORDER — LOSARTAN POTASSIUM 25 MG PO TABS: 12.5000 mg | ORAL_TABLET | Freq: Every evening | ORAL | 3 refills | Status: DC

## 2024-07-18 DIAGNOSIS — C8330 Diffuse large B-cell lymphoma, unspecified site: Secondary | ICD-10-CM | POA: Diagnosis not present

## 2024-07-18 DIAGNOSIS — Z9221 Personal history of antineoplastic chemotherapy: Secondary | ICD-10-CM | POA: Diagnosis not present

## 2024-07-20 ENCOUNTER — Ambulatory Visit: Payer: Self-pay

## 2024-07-20 ENCOUNTER — Encounter (HOSPITAL_COMMUNITY): Payer: Self-pay

## 2024-07-21 ENCOUNTER — Encounter: Payer: Self-pay | Admitting: Cardiology

## 2024-07-21 ENCOUNTER — Encounter (HOSPITAL_BASED_OUTPATIENT_CLINIC_OR_DEPARTMENT_OTHER): Payer: Self-pay | Admitting: Cardiology

## 2024-07-21 DIAGNOSIS — G4733 Obstructive sleep apnea (adult) (pediatric): Secondary | ICD-10-CM | POA: Diagnosis not present

## 2024-07-22 ENCOUNTER — Encounter (HOSPITAL_BASED_OUTPATIENT_CLINIC_OR_DEPARTMENT_OTHER): Payer: Self-pay

## 2024-07-23 ENCOUNTER — Other Ambulatory Visit (HOSPITAL_COMMUNITY): Payer: Self-pay | Admitting: Internal Medicine

## 2024-07-23 ENCOUNTER — Encounter (HOSPITAL_COMMUNITY): Payer: Self-pay | Admitting: Radiology

## 2024-07-23 ENCOUNTER — Ambulatory Visit (HOSPITAL_COMMUNITY)
Admission: RE | Admit: 2024-07-23 | Discharge: 2024-07-23 | Disposition: A | Discharge status: Home / Self Care | Source: Ambulatory Visit | Attending: Internal Medicine | Admitting: Internal Medicine

## 2024-07-23 ENCOUNTER — Other Ambulatory Visit: Payer: Self-pay

## 2024-07-23 DIAGNOSIS — Z974 Presence of external hearing-aid: Secondary | ICD-10-CM | POA: Diagnosis not present

## 2024-07-23 DIAGNOSIS — I5022 Chronic systolic (congestive) heart failure: Secondary | ICD-10-CM | POA: Insufficient documentation

## 2024-07-23 DIAGNOSIS — H6123 Impacted cerumen, bilateral: Secondary | ICD-10-CM | POA: Diagnosis not present

## 2024-07-23 MED ORDER — GADOBUTROL 1 MMOL/ML IV SOLN
13.0000 mL | Freq: Once | INTRAVENOUS | Status: AC | PRN
  Administered 2024-07-23: 13 mL via INTRAVENOUS

## 2024-07-24 ENCOUNTER — Encounter (HOSPITAL_BASED_OUTPATIENT_CLINIC_OR_DEPARTMENT_OTHER): Payer: Self-pay | Admitting: Family

## 2024-07-24 ENCOUNTER — Encounter (HOSPITAL_BASED_OUTPATIENT_CLINIC_OR_DEPARTMENT_OTHER): Payer: Self-pay | Admitting: *Deleted

## 2024-07-24 ENCOUNTER — Ambulatory Visit (HOSPITAL_BASED_OUTPATIENT_CLINIC_OR_DEPARTMENT_OTHER): Admitting: Family

## 2024-07-24 VITALS — BP 106/66 | HR 53 | Ht 76.0 in | Wt 255.0 lb

## 2024-07-24 DIAGNOSIS — I10 Essential (primary) hypertension: Secondary | ICD-10-CM

## 2024-07-24 DIAGNOSIS — E785 Hyperlipidemia, unspecified: Secondary | ICD-10-CM

## 2024-07-24 DIAGNOSIS — G4733 Obstructive sleep apnea (adult) (pediatric): Secondary | ICD-10-CM | POA: Diagnosis not present

## 2024-07-24 DIAGNOSIS — I480 Paroxysmal atrial fibrillation: Secondary | ICD-10-CM | POA: Diagnosis not present

## 2024-07-24 DIAGNOSIS — I25118 Atherosclerotic heart disease of native coronary artery with other forms of angina pectoris: Secondary | ICD-10-CM | POA: Diagnosis not present

## 2024-07-24 DIAGNOSIS — I5042 Chronic combined systolic (congestive) and diastolic (congestive) heart failure: Secondary | ICD-10-CM | POA: Diagnosis not present

## 2024-07-24 LAB — LIPID PANEL
Chol/HDL Ratio: 3.3 ratio (ref 0.0–5.0)
Cholesterol, Total: 163 mg/dL (ref 100–199)
HDL: 50 mg/dL (ref >39)
LDL Chol Calc (NIH): 99 mg/dL (ref 0–99)
Triglycerides: 74 mg/dL (ref 0–149)
VLDL Cholesterol Cal: 14 mg/dL (ref 5–40)

## 2024-07-24 LAB — ALT: ALT: 22 IU/L (ref 0–44)

## 2024-07-24 NOTE — Progress Notes (Signed)
 Cardiology Office Note:  .   Date:  07/30/2024  ID:  [name], DOB [birth year], MRN [id number] PCP: Garald Karlynn GAILS, MD  [facility] Providers Cardiologist:  Annabella Scarce, MD Cardiology APP:  Vannie Reche RAMAN, NP    History of Present Illness: .   [name] is a 78 y.o. male with a hx of relapsed large B-cell lymphoma, CAD, recurrent UTI, atrial fibrillation, BPH, diastolic heart failure, hypertension, hyperlipidemia, OSA, hypothyroid, prior tobacco use.   Initially diagnosed atrial fibrillation around 2012 and started on diltiazem  and amiodarone .  Amiodarone  discontinued due to persistent atrial fibrillation.  Underwent LHC at time of diagnosis with reported 90% obstructive of unknown vessel that developed collaterals.  He was started on Ranexa .  Echo 10/2016 EF 60 to 65%, severe bilateral atrial enlargement, IVC dilated.  He had no angina so Ranexa  discontinued.   Diagnosed with diffuse large B-cell lymphoma 05/2018.  Completed 6 cycles of chemotherapy.  Based on echo 06/2018 EF 50 to 55%, normal diastolic function, apical hypokinesis.  Heart rate poorly controlled to metoprolol  added.  Seen in the ED and BNP elevated to Lasix  20 mg daily added with improvement in breathing.  Echo 06/2019 EF 50-55%, RV mildly reduced systolic function.  Lasix  was transitioned to hydrochlorothiazide  due to urinary frequency and poor blood pressure control.  LHC 08/2019 significant two-vessel CAD sequential 60 to 70% mid LAD lesions and CTO of mid RCA with bridging left-to-right collaterals.  Consideration for two-vessel CABG versus medical management.  Deemed not candidate for surgery.  He was started on Imdur .  Due to kidney stones and urination HCTZ reduced and losartan  increased.   03/22/22 chemo resumed due to recurrence of diffuse large b-cell lymphoma.    Admitted 7/13 - 04/27/2022 after presenting with fatigue, lightheadedness found to have severe sepsis treated with IVF and IV abx  due to urinary tract infection E. coli ESBL. Due to orthostatic hypotension and near syncope, diltiazem  dose reduced. Xarelto  and aspirin  held due to thrombocytopenia. Seen 05/04/2022 by his oncology team and referred to ID  for recurrent UTI.  He was set up for IV ABX through home care.   He has been off Xarelto  since 10/18/22 due to hematuria, frequent falls as risks outweighed benefits.   At visit 12/09/23 he was undergoing Epcoritamab  bispecific for refractory DLBCL with [facility] oncology.  Updated echo 05/23/24 reduced LVEF 25-30% (previously 35-40%), moderate RV dysfunction. He was recommended for 3 day ZIO to assess rate control of atrial fibrillation. It is en route back to ZIO.  Seen 06/05/2024.  Given for the reduced LVEF he was recommended for [facility] and referred to heart failure clinic.  Losartan  12.5 mg daily initiated.  Sleep study also recommended. He established with Dr. Cherrie on 7/25 with plan for cardiac MRI.   07/02/2024 LHC with relatively stable two-vessel coronary disease including 70-80% proximal LAD stenosis and CTO of mid RCA.  LAD disease not hemodynamically significant by FFR.  Moderately elevated right heart filling pressures, mild to moderate pulmonary hypertension, mildly reduced CO/CI.  HFrEF felt to be mixed ischemic and nonischemic.  Recommended for aggressive secondary prevention of coronary artery disease and to resume anticoagulation or at least aspirin  81 mg daily when safe from bleeding/hematology standpoint.    Sleep study 07/17/2024 with severe OSA AHI 30.7/h, no central sleep apnea.  Cardiac MRI in 07/23/2024 LVEF 35%, RVEF 40% both with normal size, nonspecific mid wall LGE at basal inferior RV insertion site and subtle nonspecific  mid wall LGE in basal septum indicative of possible pressure/volume overload with normal T1 and T2 parametric's.  Presents today for follow-up independently.  He is tolerating the losartan  12.5 mg daily without lightheadedness, dizziness.   Upcoming visit with Dr. Nieves of urology, will inquire about resuming Aspirin . Stable exertional dyspnea, no LE edema, no chest pain. Reviewed LHC result.  ROS: Please see the history of present illness.    All other systems reviewed and are negative.   Studies Reviewed: .             Risk Assessment/Calculations:     CHA2DS2-VASc Score = 5   This indicates a 7.2% annual risk of stroke. The patient's score is based upon: CHF History: 1 HTN History: 1 Diabetes History: 0 Stroke History: 0 Vascular Disease History: 1 Age Score: 2 Gender Score: 0         STOP-Bang Score:  5      Physical Exam:   VS:  BP 106/66   Pulse (!) 53   Ht 6' 4 (1.93 m)   Wt 255 lb (115.7 kg)   SpO2 90%   BMI 31.04 kg/m    Wt Readings from Last 3 Encounters:  07/24/24 255 lb (115.7 kg)  07/02/24 245 lb (111.1 kg)  06/27/24 259 lb (117.5 kg)    GEN: Well nourished, well developed in no acute distress NECK: No JVD; No carotid bruits CARDIAC: IRIR, no murmurs, rubs, gallops RESPIRATORY:  Clear to auscultation without rales, wheezing or rhonchi  ABDOMEN: Soft, non-tender, non-distended EXTREMITIES:  No edema; No deformity. R inner ankle with small abrasion, bandaid placed in clinic.   ASSESSMENT AND PLAN: .    Longstanding persistent atrial fibrillation - Xarelto  has been discontinued 10/2022 due to thrombocytosis, hematuria, fall risk. We discussed risk/benefit and he prefers to remain off OAC at this time and verbalizes understanding of CVA risk.  Asymptomatic regarding atrial fibrillation.Rate controlled with heart rate routinely less than 100 bpm at home per his report.  Await ZIO monitor report, presently en route to ZIO. Continue metoprolol  tartrate 25 mg twice daily.    CHA2DS2-VASc Score = 5 [CHF History: 1, HTN History: 1, Diabetes History: 0, Stroke History: 0, Vascular Disease History: 1, Age Score: 2, Gender Score: 0].  Therefore, the patient's annual risk of stroke is 7.2 %.       B  cell lymphoma - On completed Epcoritamab  01/2024 per oncology at [facility]     Recurrent UTI -  Followed with Dr. Fleeta Rothman of ID. Hematuria has been much improved since discontinuation of Xarelto . On daily Keflex .    Hypothyroidism - Continue to follow with PCP.    CAD / HLD, LDL goal <70 -medically managed moderate to severe CAD.  GDMT metoprolol  tartrate 25mg  BID, atorvastatin  80mg  daily.  Imdur  previously discontinued due to hypotension. Note routed to urology to request to resume Aspirin .  ALT, lipid panel today.    Systolic and diastolic heart failure - Echo 0/7979 LVEF 50-55% ? Echo 08/2022 LVEF 35-40% ? 05/2024 LVEF 25-30% ? 07/2024 cMRI LVEF 35%, RVEF 40%. Stable exertional dyspnea.  Continue GDMT Metoprolol  tartrate 25mg  BID, Losartan  12.5mg  daily.   Low sodium diet, fluid restriction <2L, and daily weights encouraged. Educated to contact our office for weight gain of 2 lbs overnight or 5 lbs in one week.   Upcoming follow up with Dr. Bensimhon as scheduled   Hypertension - BP well controlled.   OSA - prior sleep study >10 years ago.  Sleep study 07/21/24 severe OSA, awaiting recommendations from sleep team.          Dispo: follow up as scheduled with Dr. Cherrie and in 6 mos with Dr. Raford or Reche GORMAN Finder, NP   Signed, Reche GORMAN Finder, NP

## 2024-07-24 NOTE — Patient Instructions (Signed)
 Medication Instructions:  Continue your current medications.  *If you need a refill on your cardiac medications before your next appointment, please call your pharmacy*  Lab Work: Your physician recommends that you return for lab work today: ALT, lipid panel  If you have labs (blood work) drawn today and your tests are completely normal, you will receive your results only by: MyChart Message (if you have MyChart) OR A paper copy in the mail If you have any lab test that is abnormal or we need to change your treatment, we will call you to review the results.  Follow-Up: At [facility], you and your health needs are our priority.  As part of our continuing mission to provide you with exceptional heart care, our providers are all part of one team.  This team includes your primary Cardiologist (physician) and Advanced Practice Providers or APPs (Physician Assistants and Nurse Practitioners) who all work together to provide you with the care you need, when you need it.  Your next appointment:   As scheduled with Dr. Cherrie AND In 6 months with Dr. Raford or Reche GORMAN Finder, NP   We recommend signing up for the patient portal called MyChart.  Sign up information is provided on this After Visit Summary.  MyChart is used to connect with patients for Virtual Visits (Telemedicine).  Patients are able to view lab/test results, encounter notes, upcoming appointments, etc.  Non-urgent messages can be sent to your provider as well.   To learn more about what you can do with MyChart, go to [email or web address].   Other Instructions   For coronary artery disease often called heart disease we aim for optimal guideline directed medical therapy. We use the A, B, Cs to help keep us  on track!  A = Aspirin  81mg  daily - we have asked Dr. Nieves about starting this B = Beta blocker which helps to relax the heart. This is your Metoprolol . C = Cholesterol control. You take  Atorvastatin  and Zetia  to help control your cholesterol.  D = Don't forget nitroglycerin ! This is an emergency tablet to be used if you have chest pain. E = Exercise and diet - aiming for 150 minutes of exercise per week and following a low sodium diet.

## 2024-07-24 NOTE — Heart Team MDD (Signed)
   Heart Team Multi-Disciplinary Discussion  Patient: [name]  DOB: 10/12/1945  MRN: [id number]   Date: 07/20/2024  07:00 am    Attendees: Interventional Cardiology: Lurena Red, MD Lonni Hanson, MD Lonni Cash, MD Alm Clay, MD Cara Lovelace, MD Gordy Bergamo, MD Newman Lawrence, MD Deatrice Cage, MD Peter [place], MD  Cardiothoracic Surgery: Linnie Rayas, MD   Additional Attendees: Con Clunes, MD   Patient History: 78 year old male with a history of lymphoma treated with chemotherapy, known chronic total occlusion (CTO) of the RCA, and declining ejection fraction (EF now ~25-30%). Atrial fibrillation, BPH, systolic and diastolic heart failure, hypertension, hyperlipidemia, OSA, hypothyroid, recurrent UTI.   Risk Factors: Hypertension Hyperlipidemia Additional Risk Factors: OSA    CAD History: Cath in 2020 showed low-normal EF and atypical chest pain. CTO of RCA and a lesion in the prox to mid LAD. He was deemed not a surgical candidate at that time and managed medically.   Review of Prior Angiography and PCI Procedures: After extensive review of Cardiac Cath from 2020 and 06/2024 the consensus was that there was not a significant change in the coronary anatomy.   Discussion: He has a scheduled Cardiac MRI pending for further evaluation but the team felt revascularization was unlikely to offer benefit and the RCA CTO not suitable for intervention   Recommendations: Medical Therapy     Shona Palma, RN  07/24/2024 10:47 AM

## 2024-07-25 ENCOUNTER — Ambulatory Visit (HOSPITAL_BASED_OUTPATIENT_CLINIC_OR_DEPARTMENT_OTHER): Payer: Self-pay | Admitting: Family

## 2024-07-26 ENCOUNTER — Other Ambulatory Visit (HOSPITAL_COMMUNITY): Payer: Self-pay

## 2024-07-26 ENCOUNTER — Telehealth: Payer: Self-pay | Admitting: Pharmacy Technician

## 2024-07-26 ENCOUNTER — Encounter (HOSPITAL_BASED_OUTPATIENT_CLINIC_OR_DEPARTMENT_OTHER): Payer: Self-pay

## 2024-07-26 ENCOUNTER — Encounter: Payer: Self-pay | Admitting: Hematology

## 2024-07-26 MED ORDER — REPATHA SURECLICK 140 MG/ML ~~LOC~~ SOAJ: 140.0000 mg | SUBCUTANEOUS | 3 refills | Status: AC

## 2024-07-26 NOTE — Telephone Encounter (Signed)
    Pharmacy Patient Advocate Encounter   Received notification from Pt Calls Messages that prior authorization for repatha is required/requested.   Insurance verification completed.   The patient is insured through [employer].   Per test claim: PA required; PA submitted to above mentioned insurance via Latent Key/confirmation #/EOC [facility] Status is pending

## 2024-07-26 NOTE — Procedures (Signed)
 SLEEP STUDY REPORT Patient Information Study Date: 07/21/2024 Patient Name: [name] Patient ID: [id number] Birth Date: 03-18-1946 Age: 78 Gender: Male BMI: 32.7 (W=260 lb, H=6' 3'') Stopbang: 5 Referring Physician: Rolan Fuel, MD  TEST DESCRIPTION: Home sleep apnea testing was completed using the WatchPat, a Type 1 device, utilizing  peripheral arterial tonometry (PAT), chest movement, actigraphy, pulse oximetry, pulse rate, body position and snore.  AHI was calculated with apnea and hypopnea using valid sleep time as the denominator. RDI includes apneas,  hypopneas, and RERAs. The data acquired and the scoring of sleep and all associated events were performed in  accordance with the recommended standards and specifications as outlined in the AASM Manual for the Scoring of  Sleep and Associated Events 2.2.0 (2015).  FINDINGS:  1. Severe Obstructive Sleep Apnea with AHI 30.7/hr.   2. No Central Sleep Apnea with pAHIc 3.3/hr.  3. Oxygen  desaturations as low as 83%.  4. Severe snoring was present. O2 sats were < 88% for 16.9 min.  5. Total sleep time was 7 hrs and 39 min.  6. 5.7% of total sleep time was spent in REM sleep.   7. Shortened sleep onset latency at 6 min.   8. Shortened REM sleep onset latency at 76 min.   9. Total awakenings were 9.  10. Arrhythmia detection: Suggestive of possible brief atrial fibrillation lasting 4 hr 24 min 45 seconds. This is not  diagnostic and further testing with outpatient telemetry monitoring is recommended.  DIAGNOSIS:  Severe Obstructive Sleep Apnea (G47.33) Nocturnal Hypoxemia Possible Atrial Fibrillation  RECOMMENDATIONS: 1. Clinical correlation of these findings is necessary. The decision to treat obstructive sleep apnea (OSA) is usually  based on the presence of apnea symptoms or the presence of associated medical conditions such as Hypertension,  Congestive Heart Failure, Atrial Fibrillation or Obesity. The most common  symptoms of OSA are snoring, gasping for  breath while sleeping, daytime sleepiness and fatigue.  2. Initiating apnea therapy is recommended given the presence of symptoms and/or associated conditions.  Recommend proceeding with one of the following:  a. Auto-CPAP therapy with a pressure range of 5-20cm H2O.  b. An oral appliance (OA) that can be obtained from certain dentists with expertise in sleep medicine. These are  primarily of use in non-obese patients with mild and moderate disease.  c. An ENT consultation which may be useful to look for specific causes of obstruction and possible treatment  options.  d. If patient is intolerant to PAP therapy, consider referral to ENT for evaluation for hypoglossal nerve stimulator.  3. Close follow-up is necessary to ensure success with CPAP or oral appliance therapy for maximum benefit . 4. A follow-up oximetry study on CPAP is recommended to assess the adequacy of therapy and determine the need  for supplemental oxygen  or the potential need for Bi-level therapy. An arterial blood gas to determine the adequacy of  baseline ventilation and oxygenation should also be considered. 5. Healthy sleep recommendations include: adequate nightly sleep (normal 7-9 hrs/night), avoidance of caffeine after  noon and alcohol  near bedtime, and maintaining a sleep environment that is cool, dark and quiet. 6. Weight loss for overweight patients is recommended. Even modest amounts of weight loss can significantly  improve the severity of sleep apnea. 7. Snoring recommendations include: weight loss where appropriate, side sleeping, and avoidance of alcohol  before  bed. 8. Operation of motor vehicle should be avoided when sleepy.  Signature: Wilbert Bihari, MD; [facility]; Diplomat, American Board of Sleep  Medicine Electronically Signed: 07/26/2024 2:12:32 PM

## 2024-07-26 NOTE — Telephone Encounter (Signed)
-----   Message from Nurse Porter HERO sent at 07/26/2024 10:30 AM EDT -----  ----- Message ----- From: Joseph Reche RAMAN, NP Sent: 07/25/2024   9:35 AM EDT To: Porter HERO Lunger, LPN  Normal liver function. Cholesterol elevated from previous. LDL (bad cholesterol) last year 90 and this year 18 with goal being less than 70.   Ensure taking both Atorvastatin  (Lipitor ) and Ezetimibe  (Zetia ) once per day.   Recommend lifestyle changes to lower cholesterol. Recommend increasing physical activity to help lower cholesterol. Recommend adding foods such as fish, nuts (walnuts, pistachios, almonds), avocados,  olive oil, and plenty of fiber in fruits/veggies to help lower cholesterol. Recommend limiting fried foods.   Option for lifestyle changes alone with repeat FLP/ALT in 3 months or addition of PCSK9i. Information routed to [name] via MyChart. ----- Message ----- From: Interface, [facility] Lab Results In Sent: 07/24/2024  11:36 PM EDT To: Reche Lane Vannie, NP

## 2024-07-26 NOTE — Telephone Encounter (Signed)
 Pharmacy Patient Advocate Encounter  Received notification from [employer] that Prior Authorization for repatha has been APPROVED from 10/12/23 to 10/10/25. Ran test claim, Copay is $0.00. This test claim was processed through [facility]- copay amounts may vary at other pharmacies due to pharmacy/plan contracts, or as the patient moves through the different stages of their insurance plan.   PA #/Case ID/Reference #: [id number]

## 2024-07-26 NOTE — Telephone Encounter (Signed)
 Glenice Krabbe, CPhT  Gladis Porter HERO, LPN Pharmacy Patient Advocate Encounter  Received notification from [employer] that Prior Authorization for repatha has been APPROVED from 10/12/23 to 10/10/25. Ran test claim, Copay is $0.00. This test claim was processed through [facility]- copay amounts may vary at other pharmacies due to pharmacy/plan contracts, or as the patient moves through the different stages of their insurance plan.   PA #/Case ID/Reference #: [id number]   Pt made aware via mychart that Repatha PA was approved for $0.00 copay.  Pt requested to send the script to [facility].   Will send script for Repatha to [facility] pharmacy with PA# attached in the order.

## 2024-07-29 ENCOUNTER — Encounter (HOSPITAL_BASED_OUTPATIENT_CLINIC_OR_DEPARTMENT_OTHER): Payer: Self-pay

## 2024-07-29 ENCOUNTER — Other Ambulatory Visit: Payer: Self-pay

## 2024-07-29 ENCOUNTER — Emergency Department (HOSPITAL_BASED_OUTPATIENT_CLINIC_OR_DEPARTMENT_OTHER): Admission: EM | Admit: 2024-07-29 | Discharge: 2024-07-29 | Disposition: A | Discharge status: Home / Self Care

## 2024-07-29 DIAGNOSIS — N390 Urinary tract infection, site not specified: Secondary | ICD-10-CM | POA: Diagnosis not present

## 2024-07-29 DIAGNOSIS — R300 Dysuria: Secondary | ICD-10-CM | POA: Diagnosis present

## 2024-07-29 LAB — URINALYSIS, ROUTINE W REFLEX MICROSCOPIC
Bilirubin Urine: NEGATIVE
Glucose, UA: NEGATIVE mg/dL
Ketones, ur: NEGATIVE mg/dL
Nitrite: NEGATIVE
Protein, ur: >300 mg/dL — AB
RBC / HPF: >50 RBC/hpf (ref 0–5)
Specific Gravity, Urine: 1.021 (ref 1.005–1.030)
WBC, UA: >50 WBC/hpf (ref 0–5)
pH: 7.5 (ref 5.0–8.0)

## 2024-07-29 LAB — COMPREHENSIVE METABOLIC PANEL WITH GFR
ALT: 20 U/L (ref 0–44)
AST: 29 U/L (ref 15–41)
Albumin: 3.7 g/dL (ref 3.5–5.0)
Alkaline Phosphatase: 78 U/L (ref 38–126)
Anion gap: 16 — ABNORMAL HIGH (ref 5–15)
BUN: 38 mg/dL — ABNORMAL HIGH (ref 8–23)
CO2: 20 mmol/L — ABNORMAL LOW (ref 22–32)
Calcium: 10 mg/dL (ref 8.9–10.3)
Chloride: 101 mmol/L (ref 98–111)
Creatinine, Ser: 2.31 mg/dL — ABNORMAL HIGH (ref 0.61–1.24)
GFR, Estimated: 28 mL/min — ABNORMAL LOW (ref >60)
Glucose, Bld: 92 mg/dL (ref 70–99)
Potassium: 4.9 mmol/L (ref 3.5–5.1)
Sodium: 138 mmol/L (ref 135–145)
Total Bilirubin: 0.7 mg/dL (ref 0.0–1.2)
Total Protein: 6.3 g/dL — ABNORMAL LOW (ref 6.5–8.1)

## 2024-07-29 LAB — CBC WITH DIFFERENTIAL/PLATELET
Abs Immature Granulocytes: 0.06 K/uL (ref 0.00–0.07)
Basophils Absolute: 0 K/uL (ref 0.0–0.1)
Basophils Relative: 1 %
Eosinophils Absolute: 0.3 K/uL (ref 0.0–0.5)
Eosinophils Relative: 4 %
HCT: 46.9 % (ref 39.0–52.0)
Hemoglobin: 15.9 g/dL (ref 13.0–17.0)
Immature Granulocytes: 1 %
Lymphocytes Relative: 20 %
Lymphs Abs: 1.2 K/uL (ref 0.7–4.0)
MCH: 34.6 pg — ABNORMAL HIGH (ref 26.0–34.0)
MCHC: 33.9 g/dL (ref 30.0–36.0)
MCV: 102 fL — ABNORMAL HIGH (ref 80.0–100.0)
Monocytes Absolute: 0.6 K/uL (ref 0.1–1.0)
Monocytes Relative: 10 %
Neutro Abs: 4.1 K/uL (ref 1.7–7.7)
Neutrophils Relative %: 64 %
Platelets: 162 K/uL (ref 150–400)
RBC: 4.6 MIL/uL (ref 4.22–5.81)
RDW: 15.7 % — ABNORMAL HIGH (ref 11.5–15.5)
WBC: 6.3 K/uL (ref 4.0–10.5)
nRBC: 0 % (ref 0.0–0.2)

## 2024-07-29 MED ORDER — CEPHALEXIN 250 MG PO CAPS
500.0000 mg | ORAL_CAPSULE | Freq: Once | ORAL | Status: AC
  Administered 2024-07-29: 500 mg via ORAL
  Filled 2024-07-29: qty 2

## 2024-07-29 MED ORDER — CEPHALEXIN 500 MG PO CAPS: 500.0000 mg | ORAL_CAPSULE | Freq: Four times a day (QID) | ORAL | 0 refills | Status: AC

## 2024-07-29 NOTE — ED Triage Notes (Signed)
 Pt c/o intermittent burning w urination, fatigue,  x last 2wks. States he sees several specialists, but I know when one is coming.  Hx chronic UTI, states symptoms are similar.

## 2024-07-29 NOTE — Discharge Instructions (Signed)
 Take your Keflex  as prescribed.  Follow-up with your urologist next week.  Call the office to make an appointment

## 2024-07-30 ENCOUNTER — Encounter (HOSPITAL_BASED_OUTPATIENT_CLINIC_OR_DEPARTMENT_OTHER): Payer: Self-pay | Admitting: Family

## 2024-07-30 NOTE — ED Provider Notes (Signed)
 [place] EMERGENCY DEPARTMENT AT [facility] Provider Note   CSN: [id number] Arrival date & time: 07/29/24  2019     Patient presents with: Urinary Tract Infection   [name] is a 78 y.o. male.   78 year old male presents for evaluation of possible UTI.  States he is having dysuria and some increased urinary frequency.  States he gets frequent UTIs and is sometimes on a low-dose of Keflex  to help prevent them.  He denies fevers, chills, back pain, or any other symptoms or concerns at this time.   Urinary Tract Infection Presenting symptoms: dysuria   Associated symptoms: urinary frequency   Associated symptoms: no abdominal pain, no fever, no hematuria and no vomiting        Prior to Admission medications   Medication Sig Start Date End Date Taking? Authorizing Provider  cephALEXin  (KEFLEX ) 500 MG capsule Take 1 capsule (500 mg total) by mouth 4 (four) times daily for 7 days. 07/29/24 08/05/24 Yes Vash Quezada L, DO  acetaminophen  (TYLENOL ) 325 MG tablet Take 2 tablets (650 mg total) by mouth every 6 (six) hours as needed for mild pain (or Fever >/= 101). 03/25/22   Sherrill Cable Latif, DO  acyclovir  (ZOVIRAX ) 400 MG tablet Take 1 tablet (400 mg total) by mouth 2 (two) times daily. 05/28/24   Plotnikov, Karlynn GAILS, MD  albuterol  (VENTOLIN  HFA) 108 (90 Base) MCG/ACT inhaler Inhale 2 puffs into the lungs every 6 (six) hours as needed for wheezing or shortness of breath.    [provider]  alfuzosin  (UROXATRAL ) 10 MG 24 hr tablet TAKE 1 TABLET AT BEDTIME 04/03/24   McKenzie, Belvie CROME, MD  allopurinol  (ZYLOPRIM ) 100 MG tablet TAKE 1 TABLET EVERY DAY 01/31/24   Plotnikov, Aleksei V, MD  amoxicillin  (AMOXIL ) 500 MG capsule Take 2,000 mg by mouth See admin instructions. TAKE PRIOR TO DENTAL PROCEDURES    [provider]  atorvastatin  (LIPITOR ) 80 MG tablet Take 1 tablet (80 mg total) by mouth daily. 05/28/24   Plotnikov, Aleksei V, MD  buPROPion   (WELLBUTRIN  XL) 300 MG 24 hr tablet TAKE 1 TABLET EVERY DAY WITH BREAKFAST 12/19/23   Plotnikov, Aleksei V, MD  Calcium  Citrate-Vitamin D  (CALCIUM  CITRATE + D PO) Take 1 tablet by mouth 2 (two) times daily. 1200mg  of Calcium  and 1000 units vitamin D3    [provider]  cephALEXin  (KEFLEX ) 250 MG capsule Take 1 capsule (250 mg total) by mouth at bedtime. 06/05/24   Vannie Reche RAMAN, NP  Cholecalciferol  (VITAMIN D ) 50 MCG (2000 UT) tablet Take 4,000 Units by mouth in the morning and at bedtime.    [provider]  clonazePAM  (KLONOPIN ) 1 MG tablet TAKE 1 TABLET THREE TIMES DAILY AS NEEDED FOR ANXIETY 04/12/24   Plotnikov, Aleksei V, MD  CRANBERRY PO Take 1 tablet by mouth in the morning and at bedtime.    [provider]  denosumab  (PROLIA ) 60 MG/ML SOSY injection Inject 60 mg as directed every 6 (six) months. 12/10/15   [provider]  DULoxetine  (CYMBALTA ) 60 MG capsule TAKE 1 CAPSULE TWICE DAILY 01/16/24   Plotnikov, Aleksei V, MD  EPINEPHrine  0.3 mg/0.3 mL IJ SOAJ injection Inject 0.3 mg into the muscle once as needed for anaphylaxis (severe allergic reaction). 07/31/21   [provider]  Evolocumab (REPATHA SURECLICK) 140 MG/ML SOAJ Inject 140 mg into the skin every 14 (fourteen) days. 07/26/24   Vannie Reche RAMAN, NP  ezetimibe  (ZETIA ) 10 MG tablet Take 1 tablet (10  mg total) by mouth daily. 05/28/24   Plotnikov, Karlynn GAILS, MD  ferrous sulfate  325 (65 FE) MG tablet Take 325 mg by mouth 2 (two) times daily with a meal.    [provider]  finasteride (PROSCAR) 5 MG tablet Take 5 mg by mouth daily. 09/25/23   [provider]  levothyroxine  (SYNTHROID ) 125 MCG tablet TAKE 1 TABLET EVERY DAY AT 6AM 07/05/24   Plotnikov, Aleksei V, MD  loratadine  (CLARITIN ) 10 MG tablet Take 10 mg by mouth daily.    [provider]  losartan  (COZAAR ) 25 MG tablet Take 0.5 tablets (12.5 mg total) by mouth every evening. 07/16/24 10/14/24  Vannie Reche RAMAN,  NP  metoprolol  tartrate (LOPRESSOR ) 25 MG tablet Take 1 tablet (25 mg total) by mouth 2 (two) times daily. 05/28/24   Plotnikov, Aleksei V, MD  Multiple Vitamin (MULTIVITAMIN WITH MINERALS) TABS tablet Take 1 tablet by mouth every morning. Men's One-A-Day 50+    [provider]  MYRBETRIQ  25 MG TB24 tablet TAKE 1 TABLET EVERY DAY 10/06/21   McKenzie, Belvie CROME, MD  nitroGLYCERIN  (NITROSTAT ) 0.4 MG SL tablet Place 0.4 mg under the tongue every 5 (five) minutes as needed for chest pain.    [provider]  omeprazole  (PRILOSEC) 40 MG capsule TAKE 1 CAPSULE EVERY DAY BEFORE BREAKFAST 01/16/24   Plotnikov, Aleksei V, MD  Potassium Citrate 15 MEQ (1620 MG) TBCR TAKE 1 TABLET IN THE MORNING AND AT BEDTIME 02/21/24   McKenzie, Belvie CROME, MD  Probiotic Product (PROBIOTIC PO) Take 1 capsule by mouth daily with breakfast.    [provider]  SKYRIZI PEN 150 MG/ML pen Inject 150 mg into the skin every 3 (three) months. 09/10/23   [provider]  solifenacin (VESICARE) 10 MG tablet Take 10 mg by mouth daily.    [provider]  tamsulosin  (FLOMAX ) 0.4 MG CAPS capsule Take 1 capsule by mouth daily.    [provider]  testosterone  enanthate (DELATESTRYL ) 200 MG/ML injection Inject 1 mL (200 mg total) into the muscle every 14 (fourteen) days. Every other Friday 06/13/24   Plotnikov, Aleksei V, MD  vitamin C  (ASCORBIC ACID ) 500 MG tablet Take 500 mg by mouth daily with lunch.    [provider]    Allergies: Short ragweed pollen ext    Review of Systems  Constitutional:  Negative for chills and fever.  HENT:  Negative for ear pain and sore throat.   Eyes:  Negative for pain and visual disturbance.  Respiratory:  Negative for cough and shortness of breath.   Cardiovascular:  Negative for chest pain and palpitations.  Gastrointestinal:  Negative for abdominal pain and vomiting.  Genitourinary:  Positive for dysuria and frequency. Negative for  hematuria.  Musculoskeletal:  Negative for arthralgias and back pain.  Skin:  Negative for color change and rash.  Neurological:  Negative for seizures and syncope.  All other systems reviewed and are negative.   Updated Vital Signs BP 103/67   Pulse 86   Temp 97.9 F (36.6 C) (Oral)   Resp 18   SpO2 90%   Physical Exam Vitals and nursing note reviewed.  Constitutional:      General: He is not in acute distress.    Appearance: Normal appearance. He is well-developed. He is not ill-appearing.  HENT:     Head: Normocephalic and atraumatic.  Eyes:     Conjunctiva/sclera: Conjunctivae normal.  Cardiovascular:     Rate and Rhythm: Normal rate and regular  rhythm.     Heart sounds: No murmur heard. Pulmonary:     Effort: Pulmonary effort is normal. No respiratory distress.     Breath sounds: Normal breath sounds.  Abdominal:     Palpations: Abdomen is soft.     Tenderness: There is no abdominal tenderness.  Musculoskeletal:        General: No swelling.     Cervical back: Neck supple.  Skin:    General: Skin is warm and dry.     Capillary Refill: Capillary refill takes less than 2 seconds.  Neurological:     Mental Status: He is alert.  Psychiatric:        Mood and Affect: Mood normal.     (all labs ordered are listed, but only abnormal results are displayed) Labs Reviewed  URINALYSIS, ROUTINE W REFLEX MICROSCOPIC - Abnormal; Notable for the following components:      Result Value   APPearance CLOUDY (*)    Hgb urine dipstick SMALL (*)    Protein, ur >300 (*)    Leukocytes,Ua LARGE (*)    Bacteria, UA FEW (*)    All other components within normal limits  COMPREHENSIVE METABOLIC PANEL WITH GFR - Abnormal; Notable for the following components:   CO2 20 (*)    BUN 38 (*)    Creatinine, Ser 2.31 (*)    Total Protein 6.3 (*)    GFR, Estimated 28 (*)    Anion gap 16 (*)    All other components within normal limits  CBC WITH DIFFERENTIAL/PLATELET - Abnormal; Notable  for the following components:   MCV 102.0 (*)    MCH 34.6 (*)    RDW 15.7 (*)    All other components within normal limits    EKG: None  Radiology: No results found.   Procedures   Medications Ordered in the ED  cephALEXin  (KEFLEX ) capsule 500 mg (500 mg Oral Given 07/29/24 2350)                                    Medical Decision Making Patient with fairly unremarkable labs.  UA positive for early UTI.  Will start him on full dose Keflex .  Advise close follow-up with his urologist and Tylenol  as needed for pain.  Vies return to the ER for new or worsening symptoms.  He feels comfortably discharged home.  Problems Addressed: Urinary tract infection without hematuria, site unspecified: acute illness or injury  Amount and/or Complexity of Data Reviewed External Data Reviewed: notes.    Details: Prior hospital records reviewed and patient admitted in September for coronary artery disease Labs: ordered. Decision-making details documented in ED Course.    Details: Ordered and reviewed by me and patient with early UTI but otherwise fairly unremarkable labs  Risk OTC drugs. Prescription drug management.     Final diagnoses:  Urinary tract infection without hematuria, site unspecified    ED Discharge Orders          Ordered    cephALEXin  (KEFLEX ) 500 MG capsule  4 times daily        07/29/24 2332               Gennaro Bouchard L, DO 07/30/24 [phone]

## 2024-08-02 ENCOUNTER — Telehealth: Payer: Self-pay | Admitting: *Deleted

## 2024-08-02 NOTE — Telephone Encounter (Signed)
 The patient has been notified of the result and verbalized understanding.  All questions (if any) were answered. [name], CMA 08/02/2024 11:54 AM    Patient has declined the cpap titration and patient states he will not wear a cpap. He does not like anything over his face or mouth.

## 2024-08-02 NOTE — Telephone Encounter (Signed)
 What problem are you experiencing?   Who is your medical equipment company?   3)    If patient is calling about their sleep study results please route to [facility] Sleep Study Pool. Husband is wanting to know if pt qualifies for the [facility].Please advise  Please route to the sleep study coordinator.

## 2024-08-02 NOTE — Telephone Encounter (Signed)
-----   Message from [name] sent at 07/26/2024  2:13 PM EDT ----- Please let patient know that they have sleep apnea.  Recommend therapeutic CPAP titration for treatment of patient's sleep disordered breathing.

## 2024-08-03 NOTE — Progress Notes (Signed)
 [facility] CONSULT NOTE  Referring Physician: Garald Joseph GAILS, MD Primary Care: Plotnikov, Joseph GAILS, MD Primary Cardiologist: Joseph Scarce, MD  Chief Complaint:  HF  HPI:  [name] is a 78 y.o. male with a hx of relapsed large B-cell lymphoma, CAD, recurrent UTI, atrial fibrillation,OSA and systolic HF  Initially diagnosed atrial fibrillation around 201. Amiodarone  discontinued due to persistent atrial fibrillation.  Underwent LHC  with reported 90% obstructive of unknown vessel that developed collaterals.  He was started on Ranexa .  Echo 10/2016 EF 60 to 65%, severe bilateral atrial enlargement, IVC dilated.  He had no angina so Ranexa  discontinued.   Diagnosed with diffuse large B-cell lymphoma 05/2018.  Completed 6 cycles of R-CHOP chemotherapy.  Based on echo 06/2018 EF 50 to 55%, normal diastolic function, apical hypokinesis.  LHC 08/2019 significant two-vessel CAD sequential 60 to 70% mid LAD lesions and CTO of mid RCA with bridging left-to-right collaterals.  Consideration for two-vessel CABG versus medical management.  Deemed not candidate for surgery.     03/22/22 chemo resumed due to recurrence of diffuse large b-cell lymphoma. Treated with R-GDP   Admitted 04/2022 with urosepsis due to E. coli ESBL.   Echo 08/2022 LVEF 35-40%   He has been off Xarelto  since 10/18/22 due to hematuria, frequent falls as risks outweighed benefits.    At visit 12/09/23 he was undergoing Epcoritamab  bispecific for refractory DLBCL with [facility] oncology.  Updated echo 05/23/24 reduced LVEF 25-30% (previously 35-40%), moderate RV dysfunction. He was recommended for 3 day ZIO to assess rate control of atrial fibrillation.   Zio 8/25 chronic AF avg 77bpm  Presents today for follow up with his husband. Says he is felt to be in remission with his lymphoma. Says he feels fine. Will get SOB if he hurries or walks more than 50-100 feet. No CP. No edema, orthopnea or PND. Has been intolerant of HF  meds d/t low BP. Recently saw [name] and losartan  12.5 which he has tolerated. Snores excessively. Pending R/L cath next week   Past Medical History:  Diagnosis Date   Allergy 11/14/1975   Benign localized prostatic hyperplasia with lower urinary tract symptoms (LUTS)    Blood transfusion without reported diagnosis    CAD (coronary artery disease) cardiologist--  dr t. Lane   hx of known CAD obstruction w/ collaterals (cath done @ [facility] 12-31-2011) ;   last cardiac cath 08-13-2019  showed sig. 2V CAD involving proxLAD and CTO of the RCA/  chronic total occlusion midRCA w/ bridging and L>R collaterals   Cancer (HCC) 06/17/2018   Chronic kidney disease    Diastolic CHF (HCC)    dx 06-15-2019 hospital admission (followed by pcp)   Diffuse large B cell lymphoma [facility]) oncologist-- dr onesimo--- in remission   dx 08/ 2019 -- bx left tonsill mass-- involving lymph nodes--- completed chemotherapy 10-12-2018   DJD (degenerative joint disease)    Dysthymic disorder    Environmental allergies    GAD (generalized anxiety disorder)    with dysthymia   GERD (gastroesophageal reflux disease)    History of falling    multiple times   History of kidney stones    HLD (hyperlipidemia)    Hyperlipidemia    Hypertension    Hypogonadism in male    OA (osteoarthritis)    OSA on CPAP    cpap machine-settings 17   Osteoporosis with fracture 04/24/2013   Persistent atrial fibrillation (HCC)    On Xarelto , cardiologist--- dr r.  [place]   Plaque psoriasis    dermatologist--- dr ryan gammon--- currently taking otezla    Post-surgical hypothyroidism    followed by pcp---  s/p total thyroidectomy for graves disease in 1987   Sleep apnea    Wears hearing aid in both ears     Current Outpatient Medications  Medication Sig Dispense Refill   acetaminophen  (TYLENOL ) 325 MG tablet Take 2 tablets (650 mg total) by mouth every 6 (six) hours as needed for mild pain (or Fever >/= 101). 20 tablet     acyclovir  (ZOVIRAX ) 400 MG tablet Take 1 tablet (400 mg total) by mouth 2 (two) times daily. 180 tablet 3   albuterol  (VENTOLIN  HFA) 108 (90 Base) MCG/ACT inhaler Inhale 2 puffs into the lungs every 6 (six) hours as needed for wheezing or shortness of breath.     alfuzosin  (UROXATRAL ) 10 MG 24 hr tablet TAKE 1 TABLET AT BEDTIME 90 tablet 3   allopurinol  (ZYLOPRIM ) 100 MG tablet TAKE 1 TABLET EVERY DAY 90 tablet 3   amoxicillin  (AMOXIL ) 500 MG capsule Take 2,000 mg by mouth See admin instructions. TAKE PRIOR TO DENTAL PROCEDURES     atorvastatin  (LIPITOR ) 80 MG tablet Take 1 tablet (80 mg total) by mouth daily. 90 tablet 3   buPROPion  (WELLBUTRIN  XL) 300 MG 24 hr tablet TAKE 1 TABLET EVERY DAY WITH BREAKFAST 90 tablet 3   Calcium  Citrate-Vitamin D  (CALCIUM  CITRATE + D PO) Take 1 tablet by mouth 2 (two) times daily. 1200mg  of Calcium  and 1000 units vitamin D3     cephALEXin  (KEFLEX ) 250 MG capsule Take 1 capsule (250 mg total) by mouth at bedtime.     cephALEXin  (KEFLEX ) 500 MG capsule Take 1 capsule (500 mg total) by mouth 4 (four) times daily for 7 days. 28 capsule 0   Cholecalciferol  (VITAMIN D ) 50 MCG (2000 UT) tablet Take 4,000 Units by mouth in the morning and at bedtime.     clonazePAM  (KLONOPIN ) 1 MG tablet TAKE 1 TABLET THREE TIMES DAILY AS NEEDED FOR ANXIETY 90 tablet 3   CRANBERRY PO Take 1 tablet by mouth in the morning and at bedtime.     denosumab  (PROLIA ) 60 MG/ML SOSY injection Inject 60 mg as directed every 6 (six) months.     DULoxetine  (CYMBALTA ) 60 MG capsule TAKE 1 CAPSULE TWICE DAILY 180 capsule 3   EPINEPHrine  0.3 mg/0.3 mL IJ SOAJ injection Inject 0.3 mg into the muscle once as needed for anaphylaxis (severe allergic reaction).     Evolocumab (REPATHA SURECLICK) 140 MG/ML SOAJ Inject 140 mg into the skin every 14 (fourteen) days. 6 mL 3   ezetimibe  (ZETIA ) 10 MG tablet Take 1 tablet (10 mg total) by mouth daily. 90 tablet 3   ferrous sulfate  325 (65 FE) MG tablet Take 325 mg  by mouth 2 (two) times daily with a meal.     finasteride (PROSCAR) 5 MG tablet Take 5 mg by mouth daily.     levothyroxine  (SYNTHROID ) 125 MCG tablet TAKE 1 TABLET EVERY DAY AT 6AM 90 tablet 3   loratadine  (CLARITIN ) 10 MG tablet Take 10 mg by mouth daily.     losartan  (COZAAR ) 25 MG tablet Take 0.5 tablets (12.5 mg total) by mouth every evening. 45 tablet 3   metoprolol  tartrate (LOPRESSOR ) 25 MG tablet Take 1 tablet (25 mg total) by mouth 2 (two) times daily. 180 tablet 3   Multiple Vitamin (MULTIVITAMIN WITH MINERALS) TABS tablet Take 1 tablet by mouth every morning. Men's One-A-Day  50+     MYRBETRIQ  25 MG TB24 tablet TAKE 1 TABLET EVERY DAY 90 tablet 3   nitroGLYCERIN  (NITROSTAT ) 0.4 MG SL tablet Place 0.4 mg under the tongue every 5 (five) minutes as needed for chest pain.     omeprazole  (PRILOSEC) 40 MG capsule TAKE 1 CAPSULE EVERY DAY BEFORE BREAKFAST 90 capsule 3   Potassium Citrate 15 MEQ (1620 MG) TBCR TAKE 1 TABLET IN THE MORNING AND AT BEDTIME 180 tablet 3   Probiotic Product (PROBIOTIC PO) Take 1 capsule by mouth daily with breakfast.     SKYRIZI PEN 150 MG/ML pen Inject 150 mg into the skin every 3 (three) months.     solifenacin (VESICARE) 10 MG tablet Take 10 mg by mouth daily.     tamsulosin  (FLOMAX ) 0.4 MG CAPS capsule Take 1 capsule by mouth daily.     testosterone  enanthate (DELATESTRYL ) 200 MG/ML injection Inject 1 mL (200 mg total) into the muscle every 14 (fourteen) days. Every other Friday 5 mL 3   vitamin C  (ASCORBIC ACID ) 500 MG tablet Take 500 mg by mouth daily with lunch.     No current facility-administered medications for this visit.    Allergies  Allergen Reactions   Short Ragweed Pollen Ext Cough      Social History   Socioeconomic History   Marital status: Married    Spouse name: [name]   Number of children: Not on file   Years of education: Not on file   Highest education level: Master's degree (e.g., MA, MS, MEng, MEd, MSW, MBA)  Occupational  History   Occupation: RETIRED  Tobacco Use   Smoking status: Former    Current packs/day: 0.00    Average packs/day: 1 pack/day for 50.0 years (50.0 ttl pk-yrs)    Types: Cigarettes    Start date: 01/11/1962    Quit date: 01/12/2012    Years since quitting: 12.5    Passive exposure: Past   Smokeless tobacco: Never  Vaping Use   Vaping status: Never Used  Substance and Sexual Activity   Alcohol  use: Yes    Comment: rarely   Drug use: Never   Sexual activity: Yes  Other Topics Concern   Not on file  Social History Narrative   Lives In home with spouse [name]      Social Drivers of Health   Financial Resource Strain: Low Risk  (05/24/2024)   Overall Financial Resource Strain (CARDIA)    Difficulty of Paying Living Expenses: Not hard at all  Food Insecurity: No Food Insecurity (05/24/2024)   Hunger Vital Sign    Worried About Running Out of Food in the Last Year: Never true    Ran Out of Food in the Last Year: Never true  Transportation Needs: No Transportation Needs (05/24/2024)   PRAPARE - Administrator, Civil Service (Medical): No    Lack of Transportation (Non-Medical): No  Physical Activity: Inactive (05/24/2024)   Exercise Vital Sign    Days of Exercise per Week: 0 days    Minutes of Exercise per Session: Not on file  Stress: Stress Concern Present (05/24/2024)   [employer] of Occupational Health - Occupational Stress Questionnaire    Feeling of Stress: To some extent  Social Connections: Moderately Isolated (05/24/2024)   Social Connection and Isolation Panel    Frequency of Communication with Friends and Family: Never    Frequency of Social Gatherings with Friends and Family: Never    Attends Religious Services: More than 4  times per year    Active Member of Clubs or Organizations: No    Attends Banker Meetings: Not on file    Marital Status: Married  Intimate Partner Violence: Not At Risk (02/02/2024)   Humiliation, Afraid, Rape, and  Kick questionnaire    Fear of Current or Ex-Partner: No    Emotionally Abused: No    Physically Abused: No    Sexually Abused: No      Family History  Problem Relation Age of Onset   CAD Father 94   Asthma Father    Alcohol  abuse Father    Arthritis Mother    Alcohol  abuse Sister     There were no vitals filed for this visit.   PHYSICAL EXAM: General:  obese No respiratory difficulty HEENT: normal Neck: supple. no JVD. Carotids 2+ bilat; no bruits. No lymphadenopathy or thryomegaly appreciated. Cor: PMI nondisplaced.Irreg. No rubs, gallops or murmurs. Lungs: clear Abdomen: obese soft, nontender, nondistended. No hepatosplenomegaly. No bruits or masses. Good bowel sounds. Extremities: no cyanosis, clubbing, rash, edema Neuro: alert & oriented x 3, cranial nerves grossly intact. moves all 4 extremities w/o difficulty. Affect pleasant.  ECG: AF 78 LAFB Personally reviewed    ASSESSMENT & PLAN:  1. Chronic systolic HF - Echo 06/2019 LVEF 50-55% ? Echo 08/2022 LVEF 35-40% ? 05/2024 LVEF 25-30%.  - Cath 2020 with 2v CAD -> medical RX. EF normal at time - Has plan for cath next week to look for progressive CAD (agree with plan) - Suspect possible component of chemotoxicity (R-CHOP) - NYHA II - Volume ok - GDMT limited by frequent UTIs (no SGLT2i) and hyperkalemia (no spiro) - Continue losartan  12.5 daily (will not titrate today with recent K 5.3) -> may need Lokelma to help titrate - Continue metoprolol  25 bid (will switch to Toprol  at next visit after cath) - Plan cMRI  2. Chronic AF - Zio 8/25 - avg rate 77 (adequate rate control) - Off AC duet o bleeds   3. Recurrent B cell lymphoma  - s/p R-CHOP 2019 - s/p RGDP 2023 - recently treated with Epcoritamab    4. CAD - 2v CAD on cath 2020 - repeat cath next week with Dr. Mady due to dropping EF - no current angina - management per Gen Cards   5. OSA  - prior sleep study >10 years ago. Last used CPAP 2-3 years ago.  Interested in trying again. - Spouse notes he stops breathing, also some daytime somnolence. - STOPBang 5 - Plan Itamar study  Harlene CHRISTELLA Gainer, FNP  4:45 PM

## 2024-08-07 ENCOUNTER — Telehealth (HOSPITAL_COMMUNITY): Payer: Self-pay

## 2024-08-07 NOTE — Telephone Encounter (Signed)
 Called to confirm/remind patient of their appointment at [facility] on 08/08/24.   Appointment:   [] Confirmed  [x] Left mess   [] No answer/No voice mail  [] VM Full/unable to leave message  [] Phone not in service  And to bring in all medications and/or complete list.

## 2024-08-08 ENCOUNTER — Other Ambulatory Visit: Payer: Self-pay | Admitting: Internal Medicine

## 2024-08-08 ENCOUNTER — Inpatient Hospital Stay (HOSPITAL_BASED_OUTPATIENT_CLINIC_OR_DEPARTMENT_OTHER)
Admission: EM | Admit: 2024-08-08 | Discharge: 2024-08-21 | DRG: 552 | Disposition: A | Discharge status: Home / Self Care | Attending: Family Medicine | Admitting: Family Medicine

## 2024-08-08 ENCOUNTER — Other Ambulatory Visit: Payer: Self-pay

## 2024-08-08 ENCOUNTER — Ambulatory Visit (HOSPITAL_COMMUNITY): Payer: Self-pay | Admitting: Family Medicine

## 2024-08-08 ENCOUNTER — Emergency Department (HOSPITAL_BASED_OUTPATIENT_CLINIC_OR_DEPARTMENT_OTHER)

## 2024-08-08 ENCOUNTER — Encounter (HOSPITAL_COMMUNITY): Payer: Self-pay

## 2024-08-08 ENCOUNTER — Ambulatory Visit (HOSPITAL_COMMUNITY)
Admission: RE | Admit: 2024-08-08 | Discharge: 2024-08-08 | Disposition: A | Discharge status: Home / Self Care | Source: Ambulatory Visit | Attending: Family Medicine | Admitting: Family Medicine

## 2024-08-08 DIAGNOSIS — Z87891 Personal history of nicotine dependence: Secondary | ICD-10-CM

## 2024-08-08 DIAGNOSIS — Z825 Family history of asthma and other chronic lower respiratory diseases: Secondary | ICD-10-CM

## 2024-08-08 DIAGNOSIS — R296 Repeated falls: Secondary | ICD-10-CM | POA: Diagnosis present

## 2024-08-08 DIAGNOSIS — Z79890 Hormone replacement therapy (postmenopausal): Secondary | ICD-10-CM

## 2024-08-08 DIAGNOSIS — N390 Urinary tract infection, site not specified: Secondary | ICD-10-CM | POA: Diagnosis present

## 2024-08-08 DIAGNOSIS — M48061 Spinal stenosis, lumbar region without neurogenic claudication: Principal | ICD-10-CM | POA: Diagnosis present

## 2024-08-08 DIAGNOSIS — L400 Psoriasis vulgaris: Secondary | ICD-10-CM | POA: Diagnosis present

## 2024-08-08 DIAGNOSIS — I482 Chronic atrial fibrillation, unspecified: Secondary | ICD-10-CM | POA: Diagnosis not present

## 2024-08-08 DIAGNOSIS — E669 Obesity, unspecified: Secondary | ICD-10-CM | POA: Diagnosis present

## 2024-08-08 DIAGNOSIS — Z87440 Personal history of urinary (tract) infections: Secondary | ICD-10-CM | POA: Diagnosis not present

## 2024-08-08 DIAGNOSIS — I4819 Other persistent atrial fibrillation: Secondary | ICD-10-CM | POA: Diagnosis present

## 2024-08-08 DIAGNOSIS — D631 Anemia in chronic kidney disease: Secondary | ICD-10-CM | POA: Diagnosis present

## 2024-08-08 DIAGNOSIS — R29898 Other symptoms and signs involving the musculoskeletal system: Secondary | ICD-10-CM | POA: Diagnosis not present

## 2024-08-08 DIAGNOSIS — I5022 Chronic systolic (congestive) heart failure: Secondary | ICD-10-CM | POA: Insufficient documentation

## 2024-08-08 DIAGNOSIS — I428 Other cardiomyopathies: Secondary | ICD-10-CM | POA: Diagnosis not present

## 2024-08-08 DIAGNOSIS — Z79899 Other long term (current) drug therapy: Secondary | ICD-10-CM

## 2024-08-08 DIAGNOSIS — I6523 Occlusion and stenosis of bilateral carotid arteries: Secondary | ICD-10-CM | POA: Diagnosis not present

## 2024-08-08 DIAGNOSIS — N401 Enlarged prostate with lower urinary tract symptoms: Secondary | ICD-10-CM | POA: Diagnosis present

## 2024-08-08 DIAGNOSIS — C8330 Diffuse large B-cell lymphoma, unspecified site: Secondary | ICD-10-CM | POA: Insufficient documentation

## 2024-08-08 DIAGNOSIS — Z6831 Body mass index (BMI) 31.0-31.9, adult: Secondary | ICD-10-CM

## 2024-08-08 DIAGNOSIS — Z9181 History of falling: Secondary | ICD-10-CM

## 2024-08-08 DIAGNOSIS — G459 Transient cerebral ischemic attack, unspecified: Principal | ICD-10-CM | POA: Diagnosis present

## 2024-08-08 DIAGNOSIS — I272 Pulmonary hypertension, unspecified: Secondary | ICD-10-CM | POA: Diagnosis present

## 2024-08-08 DIAGNOSIS — G4733 Obstructive sleep apnea (adult) (pediatric): Secondary | ICD-10-CM | POA: Insufficient documentation

## 2024-08-08 DIAGNOSIS — I2510 Atherosclerotic heart disease of native coronary artery without angina pectoris: Secondary | ICD-10-CM | POA: Diagnosis present

## 2024-08-08 DIAGNOSIS — Z91048 Other nonmedicinal substance allergy status: Secondary | ICD-10-CM

## 2024-08-08 DIAGNOSIS — F419 Anxiety disorder, unspecified: Secondary | ICD-10-CM | POA: Diagnosis present

## 2024-08-08 DIAGNOSIS — E7800 Pure hypercholesterolemia, unspecified: Secondary | ICD-10-CM | POA: Diagnosis present

## 2024-08-08 DIAGNOSIS — K219 Gastro-esophageal reflux disease without esophagitis: Secondary | ICD-10-CM | POA: Diagnosis present

## 2024-08-08 DIAGNOSIS — G9389 Other specified disorders of brain: Secondary | ICD-10-CM | POA: Diagnosis present

## 2024-08-08 DIAGNOSIS — I6503 Occlusion and stenosis of bilateral vertebral arteries: Secondary | ICD-10-CM | POA: Diagnosis not present

## 2024-08-08 DIAGNOSIS — Z7982 Long term (current) use of aspirin: Secondary | ICD-10-CM

## 2024-08-08 DIAGNOSIS — Z96641 Presence of right artificial hip joint: Secondary | ICD-10-CM | POA: Diagnosis present

## 2024-08-08 DIAGNOSIS — R531 Weakness: Secondary | ICD-10-CM | POA: Diagnosis not present

## 2024-08-08 DIAGNOSIS — M4856XA Collapsed vertebra, not elsewhere classified, lumbar region, initial encounter for fracture: Secondary | ICD-10-CM | POA: Diagnosis present

## 2024-08-08 DIAGNOSIS — N1832 Chronic kidney disease, stage 3b: Secondary | ICD-10-CM | POA: Diagnosis present

## 2024-08-08 DIAGNOSIS — I130 Hypertensive heart and chronic kidney disease with heart failure and stage 1 through stage 4 chronic kidney disease, or unspecified chronic kidney disease: Secondary | ICD-10-CM | POA: Diagnosis present

## 2024-08-08 DIAGNOSIS — W19XXXA Unspecified fall, initial encounter: Secondary | ICD-10-CM | POA: Diagnosis not present

## 2024-08-08 DIAGNOSIS — E039 Hypothyroidism, unspecified: Secondary | ICD-10-CM

## 2024-08-08 DIAGNOSIS — M160 Bilateral primary osteoarthritis of hip: Secondary | ICD-10-CM | POA: Diagnosis not present

## 2024-08-08 DIAGNOSIS — Z811 Family history of alcohol abuse and dependence: Secondary | ICD-10-CM

## 2024-08-08 DIAGNOSIS — Z9221 Personal history of antineoplastic chemotherapy: Secondary | ICD-10-CM

## 2024-08-08 DIAGNOSIS — Z96653 Presence of artificial knee joint, bilateral: Secondary | ICD-10-CM | POA: Diagnosis present

## 2024-08-08 DIAGNOSIS — M810 Age-related osteoporosis without current pathological fracture: Secondary | ICD-10-CM | POA: Diagnosis present

## 2024-08-08 DIAGNOSIS — I4891 Unspecified atrial fibrillation: Secondary | ICD-10-CM | POA: Diagnosis present

## 2024-08-08 DIAGNOSIS — Z79620 Long term (current) use of immunosuppressive biologic: Secondary | ICD-10-CM

## 2024-08-08 DIAGNOSIS — I4821 Permanent atrial fibrillation: Secondary | ICD-10-CM

## 2024-08-08 DIAGNOSIS — E890 Postprocedural hypothyroidism: Secondary | ICD-10-CM | POA: Diagnosis present

## 2024-08-08 DIAGNOSIS — Z8261 Family history of arthritis: Secondary | ICD-10-CM

## 2024-08-08 DIAGNOSIS — Z8249 Family history of ischemic heart disease and other diseases of the circulatory system: Secondary | ICD-10-CM

## 2024-08-08 DIAGNOSIS — I951 Orthostatic hypotension: Secondary | ICD-10-CM | POA: Diagnosis present

## 2024-08-08 LAB — COMPREHENSIVE METABOLIC PANEL WITH GFR
ALT: 24 U/L (ref 0–44)
AST: 32 U/L (ref 15–41)
Albumin: 3.8 g/dL (ref 3.5–5.0)
Alkaline Phosphatase: 74 U/L (ref 38–126)
Anion gap: 9 (ref 5–15)
BUN: 31 mg/dL — ABNORMAL HIGH (ref 8–23)
CO2: 29 mmol/L (ref 22–32)
Calcium: 9.8 mg/dL (ref 8.9–10.3)
Chloride: 99 mmol/L (ref 98–111)
Creatinine, Ser: 2.15 mg/dL — ABNORMAL HIGH (ref 0.61–1.24)
GFR, Estimated: 31 mL/min — ABNORMAL LOW (ref >60)
Glucose, Bld: 89 mg/dL (ref 70–99)
Potassium: 4.8 mmol/L (ref 3.5–5.1)
Sodium: 137 mmol/L (ref 135–145)
Total Bilirubin: 0.7 mg/dL (ref 0.0–1.2)
Total Protein: 6 g/dL — ABNORMAL LOW (ref 6.5–8.1)

## 2024-08-08 LAB — URINE DRUG SCREEN
Amphetamines: NEGATIVE
Barbiturates: NEGATIVE
Benzodiazepines: NEGATIVE
Cocaine: NEGATIVE
Fentanyl: NEGATIVE
Methadone Scn, Ur: NEGATIVE
Opiates: NEGATIVE
Tetrahydrocannabinol: NEGATIVE

## 2024-08-08 LAB — CBC
HCT: 47.6 % (ref 39.0–52.0)
Hemoglobin: 15.7 g/dL (ref 13.0–17.0)
MCH: 33.7 pg (ref 26.0–34.0)
MCHC: 33 g/dL (ref 30.0–36.0)
MCV: 102.1 fL — ABNORMAL HIGH (ref 80.0–100.0)
Platelets: 142 K/uL — ABNORMAL LOW (ref 150–400)
RBC: 4.66 MIL/uL (ref 4.22–5.81)
RDW: 15.8 % — ABNORMAL HIGH (ref 11.5–15.5)
WBC: 5.9 K/uL (ref 4.0–10.5)
nRBC: 0 % (ref 0.0–0.2)

## 2024-08-08 LAB — DIFFERENTIAL
Abs Immature Granulocytes: 0.07 K/uL (ref 0.00–0.07)
Basophils Absolute: 0 K/uL (ref 0.0–0.1)
Basophils Relative: 1 %
Eosinophils Absolute: 0.1 K/uL (ref 0.0–0.5)
Eosinophils Relative: 2 %
Immature Granulocytes: 1 %
Lymphocytes Relative: 21 %
Lymphs Abs: 1.3 K/uL (ref 0.7–4.0)
Monocytes Absolute: 0.5 K/uL (ref 0.1–1.0)
Monocytes Relative: 9 %
Neutro Abs: 3.8 K/uL (ref 1.7–7.7)
Neutrophils Relative %: 66 %

## 2024-08-08 LAB — BRAIN NATRIURETIC PEPTIDE: B Natriuretic Peptide: 80.5 pg/mL (ref 0.0–100.0)

## 2024-08-08 LAB — PROTIME-INR
INR: 1 (ref 0.8–1.2)
Prothrombin Time: 13.6 s (ref 11.4–15.2)

## 2024-08-08 LAB — BASIC METABOLIC PANEL WITH GFR
Anion gap: 15 (ref 5–15)
BUN: 32 mg/dL — ABNORMAL HIGH (ref 8–23)
CO2: 28 mmol/L (ref 22–32)
Calcium: 9.2 mg/dL (ref 8.9–10.3)
Chloride: 95 mmol/L — ABNORMAL LOW (ref 98–111)
Creatinine, Ser: 2 mg/dL — ABNORMAL HIGH (ref 0.61–1.24)
GFR, Estimated: 34 mL/min — ABNORMAL LOW (ref >60)
Glucose, Bld: 75 mg/dL (ref 70–99)
Potassium: 4.9 mmol/L (ref 3.5–5.1)
Sodium: 138 mmol/L (ref 135–145)

## 2024-08-08 LAB — CBG MONITORING, ED: Glucose-Capillary: 97 mg/dL (ref 70–99)

## 2024-08-08 LAB — APTT: aPTT: 34 s (ref 24–36)

## 2024-08-08 MED ORDER — ASPIRIN 81 MG PO CHEW
81.0000 mg | CHEWABLE_TABLET | Freq: Once | ORAL | Status: AC
  Administered 2024-08-08: 81 mg via ORAL
  Filled 2024-08-08: qty 1

## 2024-08-08 MED ORDER — CLOPIDOGREL BISULFATE 300 MG PO TABS
300.0000 mg | ORAL_TABLET | ORAL | Status: AC
Start: 2024-08-08 — End: 2024-08-08
  Administered 2024-08-08: 300 mg via ORAL
  Filled 2024-08-08: qty 1

## 2024-08-08 MED ORDER — IOHEXOL 350 MG/ML SOLN
50.0000 mL | Freq: Once | INTRAVENOUS | Status: AC | PRN
  Administered 2024-08-08: 50 mL via INTRAVENOUS

## 2024-08-08 MED ORDER — METOPROLOL SUCCINATE ER 25 MG PO TB24: 25.0000 mg | ORAL_TABLET | Freq: Every day | ORAL | 3 refills | Status: DC

## 2024-08-08 NOTE — Progress Notes (Signed)
 ReDS Vest / Clip - 08/08/24 1000       ReDS Vest / Clip   Station Marker D    Ruler Value 38    ReDS Value Range Low volume    ReDS Actual Value 30

## 2024-08-08 NOTE — ED Notes (Signed)
Pt speaking with teleneurology at this time.

## 2024-08-08 NOTE — Consult Note (Signed)
 [facility] Tele[facility]rology Consult Services  Stat Consult  Patient Name:   [name], [name] Date of Birth:   1945/12/01 Identification Number:   MRN - [id number] Date of Service:   08/08/2024 21:16:37  Diagnosis:       I63.89 - Cerebrovascular accident (CVA) due to other mechanism ([facility])  Impression 1. Right leg weakness 2. afib not on AC 3. severe b/l vertebral artery origin stenosis 4. incidental R neck soft tissue lesion 5. DLBCL on CAR-T - says AC was stopped for chemo - CTH no acute findings - presentation most concerning for acute ischemic stroke, recommend admission for MRI brain without contrast. MRI will help confirm dx and evaluate for other structural causes - Would hold of on anticoagulation at this time due to concern for acute ischemia as it could increase risk of hemorrhagic transformation.   Recommendations: Our recommendations are outlined below.  Diagnostic Studies : MRI head without contrast  Laboratory Studies : Lipid panel I ordered Hemoglobin A1c  Antithrombotic Medication : Bolus with Clopidogrel 300 mg bolus x1 and initiate dual antiplatelet therapy with Aspirin  81 mg daily and Clopidogrel 75 mg daily Permissive hypertension, Antihypertensives with prn for first 24-48 hrs post stroke onset. If BP greater than 220/120 give Labetalol  IV or Vasotec IV  Anticoagulant Medication : Hold all anticoagulation  Nursing Recommendations : IV Fluids, avoid dextrose  containing fluids, Maintain euglycemia Neuro checks q4 hrs x 24 hrs and then per shift Head of bed 30 degrees Continue with Telemetry fall precautions  Consultations : Recommend Speech therapy if failed dysphagia screen Physical therapy/Occupational therapy  Disposition : Neurology will follow    ----------------------------------------------------------------------------------------------------   Advanced Imaging: CTA Head and Neck Completed.  LVO:No  Patient  is not a candidate for NIR    Metrics: Dispatch Time: 08/08/2024 21:13:05 Callback Response Time: 08/08/2024 21:24:46  Primary Provider Notified of Diagnostic Impression and Management Plan on: 08/08/2024 22:07:07   CT HEAD: CT head unremarkable for acute infarction or hemorrhage per Radiology: I personally reviewed all the CT images that were available to me and it showed no acute hemorrhage or core infarct.   Labs wbc/hgb wnl. plt 142   ----------------------------------------------------------------------------------------------------  Chief Complaint: R leg weakness  History of Present Illness: Patient is a 78 year old Male. [name] 79M pmh afib not on [facility], relapsed DLBCL, CHF, CAD, hypercholesterolemia, dyslipidemia, generalized anxiety disorder, osteoporosis, ckd3, orthostatic hypotension, recurrent UTI, protein calorie malnutrition.  Complains of inability to put his full weigh on his Right leg since around 0800am today, says every now and then it gets a little wobbly but never like today and the leg almost didn't want to move. Tried to walk and fell down, did not injure himself. By the time EMS arrived he was able to walk with his walker again. The leg still feels funny but I can stand on it. Was taken off his anticoagulation while he is doing CAR-T therapy.   Past Medical History:      Atrial Fibrillation      There is no history of Stroke      There is no history of Seizures  Medications:  No Anticoagulant use  No Antiplatelet use Reviewed EMR for current medications  Allergies:  Reviewed  Social History: Smoking: Former Alcohol  Use: Yes Drug Use: No  Family History:  There is no family history of premature cerebrovascular disease pertinent to this consultation  ROS : 14 Points Review of Systems was performed and was negative except mentioned in HPI.  Past Surgical History: There Is No Surgical History Contributory To Today's  Visit   Examination: BP(128/64), Pulse(80), Blood Glucose(89) A&Ox4. Normal speech. No facial weakness. No apparent weakness of either arm. Cart connection stopped before exam was complete and was unable to reconnect.  Spoke with : ER physician    This consult was conducted in real time using interactive audio and immunologist. Patient was informed of the technology being used for this visit and agreed to proceed. Patient located in hospital and provider located at home/office setting.  Patient is being evaluated for possible acute neurologic impairment and high probability of imminent or life - threatening deterioration.I spent total of 20 minutes providing care to this patient, including time for face to face visit via telemedicine, review of medical records, imaging studies and discussion of findings with providers, the patient and / or family.   Dr Lamar Senters     [facility] For Inpatient follow-up with [facility] physician please call [facility] at [phone]. As we are not an outpatient service for any post hospital discharge needs please contact the hospital for assistance.  If you have any questions for the [facility] physicians or need to reconsult for clinical or diagnostic changes please contact us  via [facility] at [phone].  Non-radiologist review of imaging performed to assist with emergent clinical decision-making. Remote physician workstations do not possess the same resolution, calibration, or diagnostic capabilities as hospital-based radiology reading stations, and formal radiologist read is necessary.   Signature : Lamar Senters

## 2024-08-08 NOTE — Addendum Note (Signed)
 Encounter addended by: Dante Jeannine HERO, CMA on: 08/08/2024 12:09 PM  Actions taken: Visit diagnoses modified, Order list changed, Diagnosis association updated

## 2024-08-08 NOTE — ED Notes (Signed)
EDP assessing pt in triage. 

## 2024-08-08 NOTE — Patient Instructions (Addendum)
 Thank you for coming in today  If you had labs drawn today, any labs that are abnormal the clinic will call you No news is good news  You have been referred to cardiac rehab they will contact you for further details   Medications: STOP Lopressor   START Toprol  XL 25 mg nightly  Follow up appointments:  Your physician recommends that you schedule a follow-up appointment in:  3-4 weeks in Pharmacy  3 months With Dr. Bensimhon with echocardiogram  Please call our office to schedule the follow-up appointment in December 2025 for January 2026.    Do the following things EVERYDAY: Weigh yourself in the morning before breakfast. Write it down and keep it in a log. Take your medicines as prescribed Eat low salt foods--Limit salt (sodium) to 2000 mg per day.  Stay as active as you can everyday Limit all fluids for the day to less than 2 liters   At the [facility], you and your health needs are our priority. As part of our continuing mission to provide you with exceptional heart care, we have created designated Provider Care Teams. These Care Teams include your primary Cardiologist (physician) and Advanced Practice Providers (APPs- Physician Assistants and Nurse Practitioners) who all work together to provide you with the care you need, when you need it.   You may see any of the following providers on your designated Care Team at your next follow up: Dr Toribio Fuel Dr Ezra Shuck Dr. Ria Gardenia Greig Lenetta, NP Caffie Shed, [place] [facility] [place], [place] Beckey Coe, NP Tinnie Redman, PharmD   Please be sure to bring in all your medications bottles to every appointment.    Thank you for choosing [facility]  If you have any questions or concerns before your next appointment please send us  a message through [facility] or call our office at [phone].    TO LEAVE A MESSAGE FOR THE NURSE SELECT OPTION  2, PLEASE LEAVE A MESSAGE INCLUDING: YOUR NAME DATE OF BIRTH CALL BACK NUMBER REASON FOR CALL**this is important as we prioritize the call backs  YOU WILL RECEIVE A CALL BACK THE SAME DAY AS LONG AS YOU CALL BEFORE 4:00 PM

## 2024-08-08 NOTE — ED Provider Notes (Signed)
 [facility]ERGENC[facility] DEPARTMENT AT [facility] Provider Note   CSN: [id number] Arrival date & time: 08/08/24  1844     Patient presents with: Extremity Weakness   [name] is a 78 y.o. male.   78 year old male with a history of B-cell lymphoma, CAD, CHF, atrial fibrillation not currently on anticoagulation because of hematuria who presents emergency department with transient right sided leg weakness and numbness.  Woke up at 8 AM and was normal.  At 10 AM noticed that his right leg appeared more wobbly than usual.  At 11:30 AM patient reports that he had loss of sensation and strength in his right lower extremity.  It did cause him to fall.  No prolonged downtime.  No injuries as result of the fall.  No head strike or LOC.  Called 911 to help him get up and then fell again and decided come in to the hospital.  EMS reports that he did not have any neurologic deficits for them.  Patient does not recall exactly when the leg weakness resolved.  No back pain.  No bowel or bladder incontinence       Prior to Admission medications   Medication Sig Start Date End Date Taking? Authorizing Provider  acetaminophen  (TYLENOL ) 325 MG tablet Take 2 tablets (650 mg total) by mouth every 6 (six) hours as needed for mild pain (or Fever >/= 101). 03/25/22   Sherrill Cable Latif, DO  acyclovir  (ZOVIRAX ) 400 MG tablet Take 1 tablet (400 mg total) by mouth 2 (two) times daily. 05/28/24   Plotnikov, Karlynn GAILS, MD  albuterol  (VENTOLIN  HFA) 108 (90 Base) MCG/ACT inhaler Inhale 2 puffs into the lungs every 6 (six) hours as needed for wheezing or shortness of breath.    [provider]  alfuzosin  (UROXATRAL ) 10 MG 24 hr tablet TAKE 1 TABLET AT BEDTIME 04/03/24   McKenzie, Belvie CROME, MD  allopurinol  (ZYLOPRIM ) 100 MG tablet TAKE 1 TABLET EVERY DAY 01/31/24   Plotnikov, Aleksei V, MD  amoxicillin  (AMOXIL ) 500 MG capsule Take 2,000 mg by mouth See admin instructions. TAKE PRIOR TO DENTAL  PROCEDURES    [provider]  atorvastatin  (LIPITOR ) 80 MG tablet Take 1 tablet (80 mg total) by mouth daily. 05/28/24   Plotnikov, Karlynn GAILS, MD  buPROPion  (WELLBUTRIN  XL) 300 MG 24 hr tablet TAKE 1 TABLET EVERY DAY WITH BREAKFAST 12/19/23   Plotnikov, Aleksei V, MD  Calcium  Citrate-Vitamin D  (CALCIUM  CITRATE + D PO) Take 1 tablet by mouth 2 (two) times daily. 1200mg  of Calcium  and 1000 units vitamin D3    [provider]  cephALEXin  (KEFLEX ) 250 MG capsule Take 1 capsule (250 mg total) by mouth at bedtime. 06/05/24   Vannie Reche RAMAN, NP  Cholecalciferol  (VITAMIN D ) 50 MCG (2000 UT) tablet Take 4,000 Units by mouth in the morning and at bedtime.    [provider]  clonazePAM  (KLONOPIN ) 1 MG tablet TAKE 1 TABLET THREE TIMES DAILY AS NEEDED FOR ANXIETY 04/12/24   Plotnikov, Aleksei V, MD  CRANBERRY PO Take 1 tablet by mouth in the morning and at bedtime.    [provider]  denosumab  (PROLIA ) 60 MG/ML SOSY injection Inject 60 mg as directed every 6 (six) months. 12/10/15   [provider]  DULoxetine  (CYMBALTA ) 60 MG capsule TAKE 1 CAPSULE TWICE DAILY 01/16/24   Plotnikov, Aleksei V, MD  EPINEPHrine  0.3 mg/0.3 mL IJ SOAJ injection Inject 0.3 mg into the muscle once as needed for anaphylaxis (severe allergic reaction).  07/31/21   [provider]  Evolocumab (REPATHA SURECLICK) 140 MG/ML SOAJ Inject 140 mg into the skin every 14 (fourteen) days. 07/26/24   Vannie Reche RAMAN, NP  ezetimibe  (ZETIA ) 10 MG tablet Take 1 tablet (10 mg total) by mouth daily. 05/28/24   Plotnikov, Karlynn GAILS, MD  ferrous sulfate  325 (65 FE) MG tablet Take 325 mg by mouth 2 (two) times daily with a meal.    [provider]  finasteride (PROSCAR) 5 MG tablet Take 5 mg by mouth daily. 09/25/23   [provider]  levothyroxine  (SYNTHROID ) 125 MCG tablet TAKE 1 TABLET EVERY DAY AT 6AM 08/08/24   Plotnikov, Aleksei V, MD  loratadine  (CLARITIN ) 10 MG tablet Take 10 mg  by mouth daily.    [provider]  losartan  (COZAAR ) 25 MG tablet Take 0.5 tablets (12.5 mg total) by mouth every evening. 07/16/24 10/14/24  Vannie Reche RAMAN, NP  metoprolol  succinate (TOPROL  XL) 25 MG 24 hr tablet Take 1 tablet (25 mg total) by mouth daily. 08/08/24   Glena Harlene HERO, FNP  Multiple Vitamin (MULTIVITAMIN WITH MINERALS) TABS tablet Take 1 tablet by mouth every morning. Men's One-A-Day 50+    [provider]  MYRBETRIQ  25 MG TB24 tablet TAKE 1 TABLET EVERY DAY 10/06/21   McKenzie, Belvie CROME, MD  nitroGLYCERIN  (NITROSTAT ) 0.4 MG SL tablet Place 0.4 mg under the tongue every 5 (five) minutes as needed for chest pain.    [provider]  omeprazole  (PRILOSEC) 40 MG capsule TAKE 1 CAPSULE EVERY DAY BEFORE BREAKFAST 01/16/24   Plotnikov, Aleksei V, MD  Potassium Citrate 15 MEQ (1620 MG) TBCR TAKE 1 TABLET IN THE MORNING AND AT BEDTIME 02/21/24   McKenzie, Belvie CROME, MD  Probiotic Product (PROBIOTIC PO) Take 1 capsule by mouth daily with breakfast.    [provider]  SKYRIZI PEN 150 MG/ML pen Inject 150 mg into the skin every 3 (three) months. 09/10/23   [provider]  solifenacin (VESICARE) 10 MG tablet Take 10 mg by mouth daily.    [provider]  tamsulosin  (FLOMAX ) 0.4 MG CAPS capsule Take 1 capsule by mouth daily.    [provider]  testosterone  enanthate (DELATESTRYL ) 200 MG/ML injection Inject 1 mL (200 mg total) into the muscle every 14 (fourteen) days. Every other Friday 06/13/24   Plotnikov, Aleksei V, MD  vitamin C  (ASCORBIC ACID ) 500 MG tablet Take 500 mg by mouth daily with lunch.    [provider]    Allergies: Short ragweed pollen ext    Review of Systems  Updated Vital Signs BP 128/64   Pulse 80   Temp 98.5 F (36.9 C)   Resp 13   Wt 115.7 kg   SpO2 95%   BMI 31.04 kg/m   Physical Exam Constitutional:      Appearance: Normal appearance.  HENT:     Head: Normocephalic and atraumatic.   Eyes:     Extraocular Movements: Extraocular movements intact.     Conjunctiva/sclera: Conjunctivae normal.     Pupils: Pupils are equal, round, and reactive to light.  Neck:     Comments: No midline C-spine tenderness to palpation Cardiovascular:     Rate and Rhythm: Normal rate. Rhythm irregular.     Pulses: Normal pulses.  Pulmonary:     Effort: Pulmonary effort is normal.     Breath sounds: Normal breath sounds.  Neurological:     Mental Status: He is alert.     Comments:  NIHSS Exam  Level of Consciousness: Alert  LOC Questions: Answers Month and Age Correctly  LOC Commands: Opens and Closes Eyes and Hands on command  Best Gaze: Horizontal ocular movements intact  Visual Fields: No visual field loss  Facial Palsy: None  L Upper Extremity Motor: No drift after 10 seconds  R Upper Extremity Motor: No drift after 10 seconds  L Lower extremity Motor: No drift after 5 seconds  R Lower extremity Motor: No drift after 5 seconds  Ataxia: Absent  Sensory: Intact sensation to light touch on face, arms, trunk, and legs bilaterally  Best Language: No aphasia  Dysarthria: No dysarthria  Neglect: No visual or sensory neglect        (all labs ordered are listed, but only abnormal results are displayed) Labs Reviewed  CBC - Abnormal; Notable for the following components:      Result Value   MCV 102.1 (*)    RDW 15.8 (*)    Platelets 142 (*)    All other components within normal limits  COMPREHENSIVE METABOLIC PANEL WITH GFR - Abnormal; Notable for the following components:   BUN 31 (*)    Creatinine, Ser 2.15 (*)    Total Protein 6.0 (*)    GFR, Estimated 31 (*)    All other components within normal limits  PROTIME-INR  APTT  DIFFERENTIAL  URINE DRUG SCREEN  CBG MONITORING, ED  CBG MONITORING, ED    EKG: None  Radiology: CT ANGIO HEAD NECK W WO CM Result Date: 08/08/2024 EXAM: CTA Head and Neck with Intravenous Contrast. CT Head without Contrast. CLINICAL HISTORY:  Neuro deficit, acute, stroke suspected. Multiple falls due to RLE weakness. Pt states my right leg keeps giving out. Denies hitting his head or any other pain. TECHNIQUE: Axial CTA images of the head and neck performed with intravenous contrast. MIP reconstructed images were created and reviewed. Axial computed tomography images of the head/brain performed without intravenous contrast. Note: Per PQRS, the description of internal carotid artery percent stenosis, including 0 percent or normal exam, is based on North American Symptomatic Carotid Endarterectomy Trial (NASCET) criteria. Dose reduction technique was used including one or more of the following: automated exposure control, adjustment of mA and kV according to patient size, and/or iterative reconstruction. CONTRAST: Without and with IV contrast. 50 mL (iohexol  (OMNIPAQUE ) 350 MG/ML injection 50 mL IOHEXOL  350 MG/ML SOLN). COMPARISON: Comparison with prior study from 08/13/2023. FINDINGS: CT HEAD: BRAIN: Generalized age related cerebral atrophy with chronic microvascular ischemic disease. Calcified atherosclerosis present about the skull base. No acute intraparenchymal hemorrhage. No mass lesion. No CT evidence for acute territorial infarct. No midline shift or extra-axial collection. VENTRICLES: No hydrocephalus. ORBITS: The orbits are unremarkable. SINUSES AND MASTOIDS: The paranasal sinuses and mastoid air cells are clear. CTA NECK: COMMON CAROTID ARTERIES: Atheromatous change about the right carotid bulb without hemodynamically significant greater than 50% stenosis. Calcified plaque about the left carotid bulb with estimated 50% stenosis by NASCET criteria. No dissection or occlusion. INTERNAL CAROTID ARTERIES: Atheromatous change about the carotid siphons with associated mild to moderate narrowing bilaterally, worse on the right. No dissection or occlusion. VERTEBRAL ARTERIES: Atheromatous change at the origins of both vertebral arteries with severe  stenoses. Proximal left V4 segment not well seen due to venous contamination. Mild atheromatous change about the intradural V4 segments without significant stenosis. No dissection or occlusion. CTA HEAD: ANTERIOR CEREBRAL ARTERIES: No significant stenosis. No occlusion. No aneurysm. MIDDLE CEREBRAL ARTERIES: No significant stenosis. No occlusion. No  aneurysm. POSTERIOR CEREBRAL ARTERIES: Fetal type origin of the left PCA. No significant stenosis. No occlusion. No aneurysm. BASILAR ARTERY: No significant stenosis. No occlusion. No aneurysm. OTHER: SOFT TISSUES: 3.7 x 3.1 cm soft tissue nodular lesion seen at the subcutaneous fat of the right lower neck (series 4, image 70) nonspecific, but possibly related to history of lymphoma. Right side of port a cath in place. BONES: Moderate to advanced spondylosis at C5-C6 and C6-C7. Mild chronic compression of 4 mm at the superior endplate of T3 through T5. No acute osseous abnormality. LUNGS: Emphysema. AORTA: Aortic atherosclerosis. IMPRESSION: 1. Negative CTA for LVO or other emergent finding. 2. Left carotid bulb atherosclerotic plaque with approximately 50% stenosis by NASCET criteria. 3. Atheromatous change at the vertebral artery origins with severe stenoses. . 4. Carotid siphon atherosclerosis with mild to moderate narrowing bilaterally, worse on the right. 5. 3.7 cm soft tissue mass involving the soft tissues of the right lower neck indeterminate but possibly related to patient history of lymphoma. Correlation with physical exam recommended. 6. Emphysema. Aortic atherosclerosis. Electronically signed by: Morene Hoard MD 08/08/2024 09:03 PM EDT RP Workstation: [id number]   CT C-SPINE NO CHARGE Result Date: 08/08/2024 EXAM: CT CERVICAL SPINE WITHOUT CONTRAST 08/08/2024 08:11:54 PM TECHNIQUE: CT of the cervical spine was performed without the administration of intravenous contrast. Multiplanar reformatted images are provided for review. Automated exposure  control, iterative reconstruction, and/or weight based adjustment of the mA/kV was utilized to reduce the radiation dose to as low as reasonably achievable. COMPARISON: None available. CLINICAL HISTORY: Fall, RLE weak. Multiple falls due to RLE weakness. Pt states my right leg keeps giving out. Denies hitting his head or any other pain. FINDINGS: CERVICAL SPINE: BONES AND ALIGNMENT: No acute fracture or traumatic malalignment. Grade 1 anterolisthesis of C4 on C5. Diffusely decreased bone density. DEGENERATIVE CHANGES: Multilevel moderate degenerative changes of the spine. No associated severe osseous neural foraminal or central canal stenosis. SOFT TISSUES: No prevertebral soft tissue swelling. LUNGS (partially visualized): Central lobular and paraseptal at least moderate emphysematous changes. VASCULATURE (partially visualized): Partially visualized right central venous catheter. OTHER: Please see separately dictated CT angiography head and neck. IMPRESSION: 1. No acute abnormality of the cervical spine. Electronically signed by: Morgane Naveau MD 08/08/2024 08:19 PM EDT RP Workstation: [id number]     Procedures   Medications Ordered in the ED  aspirin  chewable tablet 81 mg (has no administration in time range)  clopidogrel (PLAVIX) tablet 300 mg (has no administration in time range)  iohexol  (OMNIPAQUE ) 350 MG/ML injection 50 mL (50 mLs Intravenous Contrast Given 08/08/24 1951)    Clinical Course as of 08/08/24 2234  Wed Aug 08, 2024  2019 Creatinine(!): 2.15 At baseline [RP]  2024 Dr Michaela from neurology consulted. Recommends having teleneurology evaluate the patient once the vessel imaging comes up and asking them if there is any benefit to admission or future to start the patient on anticoagulation again. [RP]  2132 Dr Nanci from neurology evaluating the patient [RP]  2228 Dr Doc from hospitalist consulted for admission [RP]    Clinical Course User Index [RP] Yolande Lamar BROCKS,  MD                                 Medical Decision Making Amount and/or Complexity of Data Reviewed Labs: ordered. Decision-making details documented in ED Course. Radiology: ordered.  Risk Prescription drug management. Decision regarding hospitalization.   [name]  is a 78 year old male with a history of B-cell lymphoma, CAD, CHF, atrial fibrillation not currently on anticoagulation because of hematuria who presents emergency department with transient right sided leg weakness and numbness.  Initial Ddx:  TIA, stroke, spinal cord injury, cauda equina, traumatic injury  MDM/Course:  Patient presents emergency department with numbness and weakness of his right lower extremity.  Appears that this resolved prior to arrival.  Did result in 2 falls but has not had any injuries from that.  He is not complaining of any back pain either to suggest any sort of spinal cord etiology of his symptoms.  Is high risk for stroke because he has A-fib and not currently anticoagulated.  On my exam he has a NIH stroke scale of 0.  Is also out of the window for TNK and there are no signs of an LVO at this point in time.  He underwent CT head and angio which did not show acute abnormality.  Evaluated by teleneurology who recommends admission to the hospital for MRI and TIA/stroke workup.  Upon re-evaluation no new symptoms.  Discussed with hospitalist for admission.  This patient presents to the ED for concern of complaints listed in HPI, this involves an extensive number of treatment options, and is a complaint that carries with it a high risk of complications and morbidity. Disposition including potential need for admission considered.   Dispo: Admit to Floor  Additional history obtained from significant other Records reviewed Outpatient Clinic Notes The following labs were independently interpreted: Chemistry and show CKD I independently reviewed the following imaging with scope of interpretation  limited to determining acute life threatening conditions related to emergency care: CT Head and agree with the radiologist interpretation with the following exceptions: none I personally reviewed and interpreted cardiac monitoring: atrial fibrillation (normal rate) I personally reviewed and interpreted the pt's EKG: see above for interpretation  I have reviewed the patients home medications and made adjustments as needed Consults: Hospitalist and Neurology Social Determinants of health:  Geriatric  Portions of this note were generated with Scientist, clinical (histocompatibility and immunogenetics). Dictation errors may occur despite best attempts at proofreading.     Final diagnoses:  Weakness of right lower extremity  TIA (transient ischemic attack)    ED Discharge Orders     None          Yolande Lamar BROCKS, MD 08/08/24 2234

## 2024-08-08 NOTE — Progress Notes (Signed)
 Hospitalist Transfer Note:    Nursing staff, Please call [facility] Admits & Consults System-Wide number on Amion [phone]) as soon as patient's arrival, so appropriate admitting provider can evaluate the pt.   Transferring facility: [facility] Requesting provider: Dr. Lamar Shan (EDP at [facility]) Reason for transfer: admission for further evaluation and management of acute right lower extremity weakness/numbness.    78 year old male with history of chronic systolic heart failure, paroxysmal atrial fibrillation, not on chronic anticoagulation in the setting of a history of hematuria, who presented to [facility] ED complaining of intermittent evidence of right lower extremity weakness/numbness earlier today, 08/08/2024.  The patient reports that he awoke on the morning of 08/08/2024, completely asymptomatic, before subsequently developing acute onset of right lower extremity weakness as well as right lower extremity numbness, which started later in the morning on 08/08/2024.  He notes that the symptoms spontaneously resolved, before returning again, only to resolve spontaneously again, this time without subsequent recurrence.   Medical history includes paroxysmal atrial fibrillation for which the patient is not on chronic anticoagulation due to history of hematuria.  He also has a history of chronic systolic heart failure, with most recent echocardiogram performed on 05/23/2024 notable for LVEF 25 to 30%.  Imaging notable for CT head which showed no evidence of acute intracranial process, including no evidence of acute infarct, while CTA head and neck showed no evidence of large vessel occlusion.  EDP d/w on-call neurology, Dr. Michaela, as well as tele-stroke neurologist, with associated recommendations for further evaluation of the above, including pursuit of MRI brain.   Subsequently, I accepted this patient for transfer for observation to a med-tele bed at [facility] for further work-up and management of the above.       Eva Pore, DO Hospitalist

## 2024-08-08 NOTE — ED Triage Notes (Signed)
 Pt reports after dr appt today, had an acute RT leg weakness (numbness) that resulted in pt needing to go to crouch position at ~ 1130 today. Multiple episodes of RLE weakness reported by patient. VS from EMS 106 palpated 95% 66  Speech clear in triage. VAN-

## 2024-08-09 ENCOUNTER — Observation Stay (HOSPITAL_COMMUNITY)

## 2024-08-09 ENCOUNTER — Encounter (HOSPITAL_COMMUNITY): Payer: Self-pay | Admitting: Internal Medicine

## 2024-08-09 DIAGNOSIS — G459 Transient cerebral ischemic attack, unspecified: Secondary | ICD-10-CM | POA: Diagnosis not present

## 2024-08-09 DIAGNOSIS — G319 Degenerative disease of nervous system, unspecified: Secondary | ICD-10-CM | POA: Diagnosis not present

## 2024-08-09 DIAGNOSIS — G9389 Other specified disorders of brain: Secondary | ICD-10-CM | POA: Diagnosis not present

## 2024-08-09 DIAGNOSIS — R531 Weakness: Secondary | ICD-10-CM | POA: Diagnosis not present

## 2024-08-09 DIAGNOSIS — R29898 Other symptoms and signs involving the musculoskeletal system: Secondary | ICD-10-CM | POA: Diagnosis present

## 2024-08-09 DIAGNOSIS — I6782 Cerebral ischemia: Secondary | ICD-10-CM | POA: Diagnosis not present

## 2024-08-09 DIAGNOSIS — Z743 Need for continuous supervision: Secondary | ICD-10-CM | POA: Diagnosis not present

## 2024-08-09 MED ORDER — ENOXAPARIN SODIUM 40 MG/0.4ML IJ SOSY
40.0000 mg | PREFILLED_SYRINGE | INTRAMUSCULAR | Status: DC
  Administered 2024-08-09 – 2024-08-20 (×9): 40 mg via SUBCUTANEOUS
  Filled 2024-08-09 (×11): qty 0.4

## 2024-08-09 MED ORDER — LORAZEPAM 1 MG PO TABS
1.0000 mg | ORAL_TABLET | Freq: Once | ORAL | Status: AC
  Administered 2024-08-09: 1 mg via ORAL
  Filled 2024-08-09: qty 1

## 2024-08-09 MED ORDER — PANTOPRAZOLE SODIUM 40 MG PO TBEC
40.0000 mg | DELAYED_RELEASE_TABLET | Freq: Every day | ORAL | Status: DC
  Administered 2024-08-09 – 2024-08-21 (×13): 40 mg via ORAL
  Filled 2024-08-09 (×13): qty 1

## 2024-08-09 MED ORDER — CLONAZEPAM 0.5 MG PO TABS: 0.5000 mg | ORAL_TABLET | Freq: Two times a day (BID) | ORAL | Status: DC | PRN

## 2024-08-09 MED ORDER — TAMSULOSIN HCL 0.4 MG PO CAPS
0.4000 mg | ORAL_CAPSULE | Freq: Every day | ORAL | Status: DC
  Administered 2024-08-09 – 2024-08-21 (×13): 0.4 mg via ORAL
  Filled 2024-08-09 (×13): qty 1

## 2024-08-09 MED ORDER — CLONAZEPAM 0.5 MG PO TABS
0.5000 mg | ORAL_TABLET | Freq: Three times a day (TID) | ORAL | Status: DC | PRN
  Administered 2024-08-09 – 2024-08-20 (×15): 0.5 mg via ORAL
  Filled 2024-08-09 (×15): qty 1

## 2024-08-09 MED ORDER — STROKE: EARLY STAGES OF RECOVERY BOOK
Freq: Once | Status: AC
  Filled 2024-08-09: qty 1

## 2024-08-09 MED ORDER — FINASTERIDE 5 MG PO TABS
5.0000 mg | ORAL_TABLET | Freq: Every day | ORAL | Status: DC
Start: 2024-08-09 — End: 2024-08-21
  Administered 2024-08-10 – 2024-08-21 (×12): 5 mg via ORAL
  Filled 2024-08-09 (×12): qty 1

## 2024-08-09 MED ORDER — EZETIMIBE 10 MG PO TABS
10.0000 mg | ORAL_TABLET | Freq: Every day | ORAL | Status: DC
  Administered 2024-08-09 – 2024-08-20 (×12): 10 mg via ORAL
  Filled 2024-08-09 (×12): qty 1

## 2024-08-09 MED ORDER — ACYCLOVIR 400 MG PO TABS
400.0000 mg | ORAL_TABLET | Freq: Two times a day (BID) | ORAL | Status: DC
  Administered 2024-08-09 – 2024-08-21 (×24): 400 mg via ORAL
  Filled 2024-08-09 (×26): qty 1

## 2024-08-09 MED ORDER — ACETAMINOPHEN 325 MG PO TABS
650.0000 mg | ORAL_TABLET | ORAL | Status: DC | PRN
  Administered 2024-08-12 – 2024-08-17 (×2): 650 mg via ORAL
  Filled 2024-08-09 (×2): qty 2

## 2024-08-09 MED ORDER — DULOXETINE HCL 60 MG PO CPEP
60.0000 mg | ORAL_CAPSULE | Freq: Two times a day (BID) | ORAL | Status: DC
  Administered 2024-08-09 – 2024-08-21 (×24): 60 mg via ORAL
  Filled 2024-08-09 (×24): qty 1

## 2024-08-09 MED ORDER — CEPHALEXIN 250 MG PO CAPS
250.0000 mg | ORAL_CAPSULE | Freq: Every day | ORAL | Status: DC
  Administered 2024-08-09 – 2024-08-20 (×12): 250 mg via ORAL
  Filled 2024-08-09 (×13): qty 1

## 2024-08-09 MED ORDER — SENNOSIDES-DOCUSATE SODIUM 8.6-50 MG PO TABS: 1.0000 | ORAL_TABLET | Freq: Every evening | ORAL | Status: DC | PRN

## 2024-08-09 MED ORDER — METOPROLOL SUCCINATE ER 25 MG PO TB24
25.0000 mg | ORAL_TABLET | Freq: Every day | ORAL | Status: DC
  Administered 2024-08-09 – 2024-08-21 (×12): 25 mg via ORAL
  Filled 2024-08-09 (×13): qty 1

## 2024-08-09 MED ORDER — LOSARTAN POTASSIUM 25 MG PO TABS
12.5000 mg | ORAL_TABLET | Freq: Every evening | ORAL | Status: DC
Start: 2024-08-09 — End: 2024-08-09

## 2024-08-09 MED ORDER — ASPIRIN 81 MG PO CHEW
81.0000 mg | CHEWABLE_TABLET | Freq: Every day | ORAL | Status: DC
  Administered 2024-08-09 – 2024-08-21 (×13): 81 mg via ORAL
  Filled 2024-08-09 (×13): qty 1

## 2024-08-09 MED ORDER — ALBUTEROL SULFATE HFA 108 (90 BASE) MCG/ACT IN AERS
2.0000 | INHALATION_SPRAY | Freq: Four times a day (QID) | RESPIRATORY_TRACT | Status: DC | PRN
  Administered 2024-08-09: 2 via RESPIRATORY_TRACT
  Filled 2024-08-09: qty 6.7

## 2024-08-09 MED ORDER — ACETAMINOPHEN 160 MG/5ML PO SOLN: 650.0000 mg | ORAL | Status: DC | PRN

## 2024-08-09 MED ORDER — ALLOPURINOL 100 MG PO TABS
100.0000 mg | ORAL_TABLET | Freq: Every day | ORAL | Status: DC
  Administered 2024-08-09 – 2024-08-21 (×13): 100 mg via ORAL
  Filled 2024-08-09 (×13): qty 1

## 2024-08-09 MED ORDER — BUPROPION HCL ER (XL) 300 MG PO TB24
300.0000 mg | ORAL_TABLET | Freq: Every day | ORAL | Status: DC
  Administered 2024-08-10 – 2024-08-21 (×12): 300 mg via ORAL
  Filled 2024-08-09 (×13): qty 1

## 2024-08-09 MED ORDER — ALBUTEROL SULFATE (2.5 MG/3ML) 0.083% IN NEBU: 2.5000 mg | INHALATION_SOLUTION | RESPIRATORY_TRACT | Status: DC | PRN

## 2024-08-09 MED ORDER — ATORVASTATIN CALCIUM 80 MG PO TABS
80.0000 mg | ORAL_TABLET | Freq: Every day | ORAL | Status: DC
Start: 2024-08-09 — End: 2024-08-10
  Administered 2024-08-09 – 2024-08-10 (×2): 80 mg via ORAL
  Filled 2024-08-09: qty 1
  Filled 2024-08-09: qty 2

## 2024-08-09 MED ORDER — ACETAMINOPHEN 650 MG RE SUPP: 650.0000 mg | RECTAL | Status: DC | PRN

## 2024-08-09 MED ORDER — MIRABEGRON ER 25 MG PO TB24
25.0000 mg | ORAL_TABLET | Freq: Every day | ORAL | Status: DC
Start: 2024-08-09 — End: 2024-08-21
  Administered 2024-08-10 – 2024-08-21 (×12): 25 mg via ORAL
  Filled 2024-08-09 (×12): qty 1

## 2024-08-09 MED ORDER — LEVOTHYROXINE SODIUM 100 MCG PO TABS
125.0000 ug | ORAL_TABLET | Freq: Every day | ORAL | Status: DC
  Administered 2024-08-09 – 2024-08-21 (×13): 125 ug via ORAL
  Filled 2024-08-09 (×13): qty 1

## 2024-08-09 MED ORDER — CLOPIDOGREL BISULFATE 75 MG PO TABS
75.0000 mg | ORAL_TABLET | Freq: Every day | ORAL | Status: DC
  Administered 2024-08-09 – 2024-08-10 (×2): 75 mg via ORAL
  Filled 2024-08-09 (×2): qty 1

## 2024-08-09 MED ORDER — SODIUM CHLORIDE 0.9 % IV SOLN: INTRAVENOUS | Status: AC

## 2024-08-09 NOTE — H&P (Signed)
 History and Physical    [name] FMW:[id number] DOB: 04-09-1946 DOA: 08/08/2024  I have briefly reviewed the patient's prior medical records in [facility]  PCP: Plotnikov, Karlynn GAILS, MD  Patient coming from: home via [facility]  Chief Complaint: right leg weakness  HPI: [name] is a 78 y.o. male with medical history significant of history of relapsed large B-cell lymphoma, coronary artery disease, recurrent UTIs, atrial fibrillation (not on blood thinner due to bleeding/hematuria), chronic systolic heart failure.  Patient was in his normal health on 10/29 when he attended a heart failure clinic appointment.  After the doctor appointment he had acute right leg weakness-he describes as unable to stand as it gives out.  His strength in bed remains intact.  He also had some loss of sensation that has started to resolve.  Patient denies any back pain, hip pain or knee pain.  He was seen by teleneurology at [facility] and they recommended MRI of the brain, Plavix loading dose and then daily Plavix as well as aspirin .  They also recommended permissive hypertension for 24 to 48 hours.  Imaging notable for CT head which showed no evidence of acute intracranial process, including no evidence of acute infarct, while CTA head and neck showed no evidence of large vessel occlusion   Review of Systems: As per HPI otherwise 10 point review of systems negative.   Past Medical History:  Diagnosis Date   Allergy 11/14/1975   Benign localized prostatic hyperplasia with lower urinary tract symptoms (LUTS)    Blood transfusion without reported diagnosis    CAD (coronary artery disease) cardiologist--  dr t. raford   hx of known CAD obstruction w/ collaterals (cath done @ [facility] 12-31-2011) ;   last cardiac cath 08-13-2019  showed sig. 2V CAD involving proxLAD and CTO of the RCA/  chronic total occlusion midRCA w/ bridging and L>R collaterals   Cancer (HCC) 06/17/2018   Chronic kidney disease     Diastolic CHF (HCC)    dx 06-15-2019 hospital admission (followed by pcp)   Diffuse large B cell lymphoma [facility]) oncologist-- dr onesimo--- in remission   dx 08/ 2019 -- bx left tonsill mass-- involving lymph nodes--- completed chemotherapy 10-12-2018   DJD (degenerative joint disease)    Dysthymic disorder    Environmental allergies    GAD (generalized anxiety disorder)    with dysthymia   GERD (gastroesophageal reflux disease)    History of falling    multiple times   History of kidney stones    HLD (hyperlipidemia)    Hyperlipidemia    Hypertension    Hypogonadism in male    OA (osteoarthritis)    OSA on CPAP    cpap machine-settings 17   Osteoporosis with fracture 04/24/2013   Persistent atrial fibrillation (HCC)    On Xarelto , cardiologist--- dr jonelle. [place]   Plaque psoriasis    dermatologist--- dr ryan gammon--- currently taking otezla    Post-surgical hypothyroidism    followed by pcp---  s/p total thyroidectomy for graves disease in 1987   Sleep apnea    Wears hearing aid in both ears     Past Surgical History:  Procedure Laterality Date   APPENDECTOMY  2011 approx.    CARDIAC CATHETERIZATION  12-31-2011   dr j. nolene @[facility]    normal LVF w/ multivessel CAD,  occluded RCA w/ colleterals   CATARACT EXTRACTION W/ INTRAOCULAR LENS  IMPLANT, BILATERAL  2016 approx.   COLONOSCOPY     CORONARY PRESSURE/FFR STUDY  N/A 08/13/2019   Procedure: INTRAVASCULAR PRESSURE WIRE/FFR STUDY;  Surgeon: Mady Bruckner, MD;  [facility];  Service: Cardiovascular;  Laterality: N/A;   CORONARY PRESSURE/FFR WITH 3D MAPPING N/A 07/02/2024   Procedure: Coronary Pressure/FFR w/3D Mapping;  Surgeon: Mady Bruckner, MD;  [facility]: [facility];  Service: Cardiovascular;  Laterality: N/A;   CYSTOSCOPY WITH RETROGRADE PYELOGRAM, URETEROSCOPY AND STENT PLACEMENT Left 11/07/2017   Procedure: CYSTOSCOPY WITH RETROGRADE PYELOGRAM, URETEROSCOPY AND STENT PLACEMENT;  Surgeon:  Sherrilee Belvie CROME, MD;  Location: [facility];  Service: Urology;  Laterality: Left;   CYSTOSCOPY WITH RETROGRADE PYELOGRAM, URETEROSCOPY AND STENT PLACEMENT Right 10/22/2019   Procedure: CYSTOSCOPY WITH RETROGRADE PYELOGRAM, URETEROSCOPY AND STENT PLACEMENT;  Surgeon: Sherrilee Belvie CROME, MD;  Location: [facility];  Service: Urology;  Laterality: Right;   CYSTOSCOPY/URETEROSCOPY/HOLMIUM LASER/STENT PLACEMENT Right 03/11/2017   Procedure: CYSTOSCOPY/URETEROSCOPYSTENT PLACEMENT right ureter retrograde pylegram;  Surgeon: Sherrilee Belvie CROME, MD;  [facility]: [facility];  Service: Urology;  Laterality: Right;   EYE SURGERY  08/11/2017   Cataract   HARDWARE REMOVAL  10/05/2012   Procedure: HARDWARE REMOVAL;  Surgeon: Donnice JONETTA Car, MD;  Location: [facility];  Service: Orthopedics;  Laterality: Right;  REMOVING  STRYKER  GAMMA NAIL   HARDWARE REMOVAL Right 07/03/2013   Procedure: HARDWARE REMOVAL RIGHT TIBIA ;  Surgeon: [name] VEAR Bruch, MD;  [facility];  Service: Orthopedics;  Laterality: Right;   HIP CLOSED REDUCTION Right 10/15/2013   Procedure: CLOSED MANIPULATION HIP;  Surgeon: Donnice JONETTA Car, MD;  [facility]: [facility];  Service: Orthopedics;  Laterality: Right;   HOLMIUM LASER APPLICATION Right 02/08/2017   Procedure: HOLMIUM LASER APPLICATION;  Surgeon: Belvie CROME Sherrilee, MD;  [facility];  Service: Urology;  Laterality: Right;   HOLMIUM LASER APPLICATION Left 03/26/2019   Procedure: HOLMIUM LASER APPLICATION;  Surgeon: Sherrilee Belvie CROME, MD;  [facility];  Service: Urology;  Laterality: Left;   HOLMIUM LASER APPLICATION Left 04/17/2019   Procedure: HOLMIUM LASER APPLICATION;  Surgeon: Sherrilee Belvie CROME, MD;  [facility];  Service: Urology;  Laterality: Left;   HOLMIUM LASER APPLICATION Right 10/22/2019   Procedure: HOLMIUM LASER APPLICATION;  Surgeon: Sherrilee Belvie CROME, MD;  Location: [facility];  Service: Urology;  Laterality: Right;   INCISION AND  DRAINAGE HIP Right 11/16/2013   Procedure: IRRIGATION AND DEBRIDEMENT RIGHT HIP;  Surgeon: Donnice JONETTA Car, MD;  [facility];  Service: Orthopedics;  Laterality: Right;   IR IMAGING GUIDED PORT INSERTION  06/22/2018   IR NEPHROSTOMY PLACEMENT LEFT  03/28/2019   IR URETERAL STENT LEFT NEW ACCESS W/O SEP NEPHROSTOMY CATH  10/24/2017   IR URETERAL STENT LEFT NEW ACCESS W/O SEP NEPHROSTOMY CATH  03/26/2019   JOINT REPLACEMENT     NEPHROLITHOTOMY Right 02/08/2017   Procedure: NEPHROLITHOTOMY PERCUTANEOUS WITH SURGEON ACCESS;  Surgeon: Belvie CROME Sherrilee, MD;  [facility];  Service: Urology;  Laterality: Right;   NEPHROLITHOTOMY Left 10/24/2017   Procedure: NEPHROLITHOTOMY PERCUTANEOUS;  Surgeon: Sherrilee Belvie CROME, MD;  [facility];  Service: Urology;  Laterality: Left;   NEPHROLITHOTOMY Left 03/26/2019   Procedure: NEPHROLITHOTOMY PERCUTANEOUS;  Surgeon: Sherrilee Belvie CROME, MD;  [facility];  Service: Urology;  Laterality: Left;  2 HRS   NEPHROLITHOTOMY Left 04/17/2019   Procedure: NEPHROLITHOTOMY PERCUTANEOUS;  Surgeon: Sherrilee Belvie CROME, MD;  [facility];  Service: Urology;  Laterality: Left;  2 HRS   ORIF TIBIA FRACTURE Right 02/06/2013   Procedure: OPEN  REDUCTION INTERNAL FIXATION (ORIF) TIBIA FRACTURE WITH IM ROD FIBULA;  Surgeon: [name] VEAR Bruch, MD;  [facility];  Service: Orthopedics;  Laterality: Right;   ORIF TIBIA FRACTURE Right 07/03/2013   Procedure: RIGHT TIBIA NON UNION REPAIR ;  Surgeon: [name] VEAR Bruch, MD;  Location: [facility] OR;  Service: Orthopedics;  Laterality: Right;   ORIF WRIST FRACTURE  10/02/2012   Procedure: OPEN REDUCTION INTERNAL FIXATION (ORIF) WRIST FRACTURE;  Surgeon: Elsie Mussel, MD;  [facility];  Service: Orthopedics;  Laterality: Right;  WITH   ANTIBIOTIC  CEMENT   ORIF WRIST FRACTURE Left 10/28/2013   Procedure: OPEN REDUCTION INTERNAL FIXATION (ORIF) WRIST FRACTURE with allograft;  Surgeon: Elsie Mussel, MD;  [facility];  Service: Orthopedics;  Laterality: Left;  DVR Plate   QUADRICEPS TENDON REPAIR Left 07/15/2017   Procedure: REPAIR QUADRICEP TENDON;  Surgeon: Liam Lerner, MD;  Location: [facility] OR;  Service: Orthopedics;  Laterality: Left;   RIGHT/LEFT HEART CATH AND CORONARY ANGIOGRAPHY N/A 08/13/2019   Procedure: RIGHT/LEFT HEART CATH AND CORONARY ANGIOGRAPHY;  Surgeon: Mady Bruckner, MD;  [facility];  Service: Cardiovascular;  Laterality: N/A;   RIGHT/LEFT HEART CATH AND CORONARY ANGIOGRAPHY N/A 07/02/2024   Procedure: RIGHT/LEFT HEART CATH AND CORONARY ANGIOGRAPHY;  Surgeon: Mady Bruckner, MD;  [facility];  Service: Cardiovascular;  Laterality: N/A;   THYROIDECTOMY  02/1986   TOTAL HIP ARTHROPLASTY Right 03-16-2016   @[facility]    TOTAL HIP REVISION  10/05/2012   Procedure: TOTAL HIP REVISION;  Surgeon: Donnice JONETTA Car, MD;  [facility];  Service: Orthopedics;  Laterality: Right;  RIGHT TOTAL HIP REVISION   TOTAL HIP REVISION Right 09/17/2013   Procedure: REVISION RIGHT TOTAL HIP ARTHROPLASTY ;  Surgeon: Donnice JONETTA Car, MD;  [facility];  Service: Orthopedics;  Laterality: Right;   TOTAL HIP REVISION Right 10/26/2013   Procedure: REVISION RIGHT TOTAL HIP ARTHROPLASTY;  Surgeon: Donnice JONETTA Car, MD;  [facility];  Service: Orthopedics;  Laterality: Right;   TOTAL KNEE ARTHROPLASTY Bilateral right 03-15-2011;  left 06-30-2011   TOTAL KNEE REVISION Left 04/11/2017   Procedure: TOTAL KNEE REVISION PATELLA and TIBIA;  Surgeon: Liam Lerner, MD;  [facility];  Service: Orthopedics;  Laterality: Left;     reports that he quit smoking about 12 years ago. His smoking use included cigarettes. He started smoking about 62 years ago. He has a 50 pack-year smoking history. He has been exposed to tobacco smoke. He has never used smokeless tobacco. He reports current alcohol  use. He reports that he does not use drugs.  Allergies  Allergen Reactions   Short Ragweed  Pollen Ext Cough    Family History  Problem Relation Age of Onset   CAD Father 72   Asthma Father    Alcohol  abuse Father    Arthritis Mother    Alcohol  abuse Sister     Prior to Admission medications   Medication Sig Start Date End Date Taking? Authorizing Provider  alfuzosin  (UROXATRAL ) 10 MG 24 hr tablet TAKE 1 TABLET AT BEDTIME 04/03/24  Yes McKenzie, Belvie CROME, MD  allopurinol  (ZYLOPRIM ) 100 MG tablet TAKE 1 TABLET EVERY DAY 01/31/24  Yes Plotnikov, Aleksei V, MD  buPROPion  (WELLBUTRIN  XL) 300 MG 24 hr tablet TAKE 1 TABLET EVERY DAY WITH BREAKFAST 12/19/23  Yes Plotnikov, Aleksei V, MD  Calcium  Citrate-Vitamin D  (CALCIUM  CITRATE + D PO) Take 1 tablet by mouth 2 (two) times daily. 1200mg  of Calcium  and 1000  units vitamin D3   Yes [provider]  clonazePAM  (KLONOPIN ) 1 MG tablet TAKE 1 TABLET THREE TIMES DAILY AS NEEDED FOR ANXIETY 04/12/24  Yes Plotnikov, Aleksei V, MD  CRANBERRY PO Take 1 tablet by mouth in the morning and at bedtime.   Yes [provider]  DULoxetine  (CYMBALTA ) 60 MG capsule TAKE 1 CAPSULE TWICE DAILY 01/16/24  Yes Plotnikov, Aleksei V, MD  omeprazole  (PRILOSEC) 40 MG capsule TAKE 1 CAPSULE EVERY DAY BEFORE BREAKFAST 01/16/24  Yes Plotnikov, Aleksei V, MD  Potassium Citrate 15 MEQ (1620 MG) TBCR TAKE 1 TABLET IN THE MORNING AND AT BEDTIME 02/21/24  Yes McKenzie, Belvie CROME, MD  tamsulosin  (FLOMAX ) 0.4 MG CAPS capsule Take 1 capsule by mouth daily.   Yes [provider]  acetaminophen  (TYLENOL ) 325 MG tablet Take 2 tablets (650 mg total) by mouth every 6 (six) hours as needed for mild pain (or Fever >/= 101). 03/25/22   Sherrill Cable Latif, DO  acyclovir  (ZOVIRAX ) 400 MG tablet Take 1 tablet (400 mg total) by mouth 2 (two) times daily. 05/28/24   Plotnikov, Karlynn GAILS, MD  albuterol  (VENTOLIN  HFA) 108 (90 Base) MCG/ACT inhaler Inhale 2 puffs into the lungs every 6 (six) hours as needed for wheezing or shortness of breath.    [provider]   amoxicillin  (AMOXIL ) 500 MG capsule Take 2,000 mg by mouth See admin instructions. TAKE PRIOR TO DENTAL PROCEDURES    [provider]  atorvastatin  (LIPITOR ) 80 MG tablet Take 1 tablet (80 mg total) by mouth daily. 05/28/24   Plotnikov, Karlynn GAILS, MD  cephALEXin  (KEFLEX ) 250 MG capsule Take 1 capsule (250 mg total) by mouth at bedtime. 06/05/24   Vannie Reche RAMAN, NP  Cholecalciferol  (VITAMIN D ) 50 MCG (2000 UT) tablet Take 4,000 Units by mouth in the morning and at bedtime.    [provider]  denosumab  (PROLIA ) 60 MG/ML SOSY injection Inject 60 mg as directed every 6 (six) months. 12/10/15   [provider]  EPINEPHrine  0.3 mg/0.3 mL IJ SOAJ injection Inject 0.3 mg into the muscle once as needed for anaphylaxis (severe allergic reaction). 07/31/21   [provider]  Evolocumab (REPATHA SURECLICK) 140 MG/ML SOAJ Inject 140 mg into the skin every 14 (fourteen) days. 07/26/24   Vannie Reche RAMAN, NP  ezetimibe  (ZETIA ) 10 MG tablet Take 1 tablet (10 mg total) by mouth daily. 05/28/24   Plotnikov, Karlynn GAILS, MD  ferrous sulfate  325 (65 FE) MG tablet Take 325 mg by mouth 2 (two) times daily with a meal.    [provider]  finasteride (PROSCAR) 5 MG tablet Take 5 mg by mouth daily. 09/25/23   [provider]  levothyroxine  (SYNTHROID ) 125 MCG tablet TAKE 1 TABLET EVERY DAY AT 6AM 08/08/24   Plotnikov, Aleksei V, MD  loratadine  (CLARITIN ) 10 MG tablet Take 10 mg by mouth daily.    [provider]  losartan  (COZAAR ) 25 MG tablet Take 0.5 tablets (12.5 mg total) by mouth every evening. 07/16/24 10/14/24  Vannie Reche RAMAN, NP  metoprolol  succinate (TOPROL  XL) 25 MG 24 hr tablet Take 1 tablet (25 mg total) by mouth daily. 08/08/24   Glena Harlene HERO, FNP  Multiple Vitamin (MULTIVITAMIN WITH MINERALS) TABS tablet Take 1 tablet by mouth every morning. Men's One-A-Day 50+    [provider]  MYRBETRIQ  25 MG TB24 tablet TAKE 1 TABLET EVERY DAY  10/06/21   McKenzie, Belvie CROME, MD  nitroGLYCERIN  (NITROSTAT ) 0.4 MG SL tablet Place  0.4 mg under the tongue every 5 (five) minutes as needed for chest pain.    [provider]  Probiotic Product (PROBIOTIC PO) Take 1 capsule by mouth daily with breakfast.    [provider]  SKYRIZI PEN 150 MG/ML pen Inject 150 mg into the skin every 3 (three) months. 09/10/23   [provider]  solifenacin (VESICARE) 10 MG tablet Take 10 mg by mouth daily.    [provider]  testosterone  enanthate (DELATESTRYL ) 200 MG/ML injection Inject 1 mL (200 mg total) into the muscle every 14 (fourteen) days. Every other Friday 06/13/24   Plotnikov, Aleksei V, MD  vitamin C  (ASCORBIC ACID ) 500 MG tablet Take 500 mg by mouth daily with lunch.    [provider]    Physical Exam: Vitals:   08/09/24 1127 08/09/24 1340 08/09/24 1550 08/09/24 1600  BP:  128/70 (!) 145/92   Pulse: (!) 39 91 88   Resp: (!) 28 12 19    Temp:   (!) 97.4 F (36.3 C)   TempSrc:   Oral   SpO2: 93% 93% 96%   Weight:      Height:    6' 4 (1.93 m)      Constitutional: NAD, calm, comfortable Eyes: PERRL, lids and conjunctivae normal ENMT: Mucous membranes are moist. Posterior pharynx clear of any exudate or lesions.Normal dentition.  Neck: normal, supple, no masses, no thyromegaly Respiratory: clear to auscultation bilaterally, no wheezing, no crackles. Normal respiratory effort. No accessory muscle use.  Cardiovascular: Regular rate and rhythm, no murmurs / rubs / gallops. No extremity edema. 2+ pedal pulses.  Abdomen: no tenderness, no masses palpated. Bowel sounds positive.  Musculoskeletal: no clubbing / cyanosis. Normal muscle tone.  Skin: no rashes, lesions, ulcers. No induration Neurologic: CN 2-12 grossly intact. Strength 5/5 in all 4.  Psychiatric: Normal judgment and insight. Alert and oriented x 3. Normal mood.  Mildly hard of hearing  Labs on Admission: I have personally reviewed  following labs and imaging studies  CBC: Recent Labs  Lab 08/08/24 1936  WBC 5.9  NEUTROABS 3.8  HGB 15.7  HCT 47.6  MCV 102.1*  PLT 142*   Basic Metabolic Panel: Recent Labs  Lab 08/08/24 1049 08/08/24 1936  NA 138 137  K 4.9 4.8  CL 95* 99  CO2 28 29  GLUCOSE 75 89  BUN 32* 31*  CREATININE 2.00* 2.15*  CALCIUM  9.2 9.8   GFR: Estimated Creatinine Clearance: 39.4 mL/min (A) (by C-G formula based on SCr of 2.15 mg/dL (H)). Liver Function Tests: Recent Labs  Lab 08/08/24 1936  AST 32  ALT 24  ALKPHOS 74  BILITOT 0.7  PROT 6.0*  ALBUMIN  3.8   No results for input(s): LIPASE, AMYLASE in the last 168 hours. No results for input(s): AMMONIA in the last 168 hours. Coagulation Profile: Recent Labs  Lab 08/08/24 1936  INR 1.0   Cardiac Enzymes: No results for input(s): CKTOTAL, CKMB, CKMBINDEX, TROPONINI in the last 168 hours. BNP (last 3 results) No results for input(s): PROBNP in the last 8760 hours. HbA1C: No results for input(s): HGBA1C in the last 72 hours. CBG: Recent Labs  Lab 08/08/24 1854  GLUCAP 97   Lipid Profile: No results for input(s): CHOL, HDL, LDLCALC, TRIG, CHOLHDL, LDLDIRECT in the last 72 hours. Thyroid  Function Tests: No results for input(s): TSH, T4TOTAL, FREET4, T3FREE, THYROIDAB in the last 72 hours. Anemia Panel: No results for input(s): VITAMINB12, FOLATE, FERRITIN, TIBC, IRON, RETICCTPCT in the last 72  hours. Urine analysis:    Component Value Date/Time   COLORURINE YELLOW 07/29/2024 2244   APPEARANCEUR CLOUDY (A) 07/29/2024 2244   LABSPEC 1.021 07/29/2024 2244   PHURINE 7.5 07/29/2024 2244   GLUCOSEU NEGATIVE 07/29/2024 2244   GLUCOSEU NEGATIVE 10/02/2018 1235   HGBUR SMALL (A) 07/29/2024 2244   BILIRUBINUR NEGATIVE 07/29/2024 2244   KETONESUR NEGATIVE 07/29/2024 2244   PROTEINUR >300 (A) 07/29/2024 2244   UROBILINOGEN 0.2 10/02/2018 1235   NITRITE NEGATIVE 07/29/2024  2244   LEUKOCYTESUR LARGE (A) 07/29/2024 2244     Radiological Exams on Admission: CT ANGIO HEAD NECK W WO CM Result Date: 08/08/2024 EXAM: CTA Head and Neck with Intravenous Contrast. CT Head without Contrast. CLINICAL HISTORY: Neuro deficit, acute, stroke suspected. Multiple falls due to RLE weakness. Pt states my right leg keeps giving out. Denies hitting his head or any other pain. TECHNIQUE: Axial CTA images of the head and neck performed with intravenous contrast. MIP reconstructed images were created and reviewed. Axial computed tomography images of the head/brain performed without intravenous contrast. Note: Per PQRS, the description of internal carotid artery percent stenosis, including 0 percent or normal exam, is based on [employer] American Symptomatic Carotid Endarterectomy Trial (NASCET) criteria. Dose reduction technique was used including one or more of the following: automated exposure control, adjustment of mA and kV according to patient size, and/or iterative reconstruction. CONTRAST: Without and with IV contrast. 50 mL (iohexol  (OMNIPAQUE ) 350 MG/ML injection 50 mL IOHEXOL  350 MG/ML SOLN). COMPARISON: Comparison with prior study from 08/13/2023. FINDINGS: CT HEAD: BRAIN: Generalized age related cerebral atrophy with chronic microvascular ischemic disease. Calcified atherosclerosis present about the skull base. No acute intraparenchymal hemorrhage. No mass lesion. No CT evidence for acute territorial infarct. No midline shift or extra-axial collection. VENTRICLES: No hydrocephalus. ORBITS: The orbits are unremarkable. SINUSES AND MASTOIDS: The paranasal sinuses and mastoid air cells are clear. CTA NECK: COMMON CAROTID ARTERIES: Atheromatous change about the right carotid bulb without hemodynamically significant greater than 50% stenosis. Calcified plaque about the left carotid bulb with estimated 50% stenosis by NASCET criteria. No dissection or occlusion. INTERNAL CAROTID ARTERIES:  Atheromatous change about the carotid siphons with associated mild to moderate narrowing bilaterally, worse on the right. No dissection or occlusion. VERTEBRAL ARTERIES: Atheromatous change at the origins of both vertebral arteries with severe stenoses. Proximal left V4 segment not well seen due to venous contamination. Mild atheromatous change about the intradural V4 segments without significant stenosis. No dissection or occlusion. CTA HEAD: ANTERIOR CEREBRAL ARTERIES: No significant stenosis. No occlusion. No aneurysm. MIDDLE CEREBRAL ARTERIES: No significant stenosis. No occlusion. No aneurysm. POSTERIOR CEREBRAL ARTERIES: Fetal type origin of the left PCA. No significant stenosis. No occlusion. No aneurysm. BASILAR ARTERY: No significant stenosis. No occlusion. No aneurysm. OTHER: SOFT TISSUES: 3.7 x 3.1 cm soft tissue nodular lesion seen at the subcutaneous fat of the right lower neck (series 4, image 70) nonspecific, but possibly related to history of lymphoma. Right side of port a cath in place. BONES: Moderate to advanced spondylosis at C5-C6 and C6-C7. Mild chronic compression of 4 mm at the superior endplate of T3 through T5. No acute osseous abnormality. LUNGS: Emphysema. AORTA: Aortic atherosclerosis. IMPRESSION: 1. Negative CTA for LVO or other emergent finding. 2. Left carotid bulb atherosclerotic plaque with approximately 50% stenosis by NASCET criteria. 3. Atheromatous change at the vertebral artery origins with severe stenoses. . 4. Carotid siphon atherosclerosis with mild to moderate narrowing bilaterally, worse on the right. 5. 3.7 cm soft  tissue mass involving the soft tissues of the right lower neck indeterminate but possibly related to patient history of lymphoma. Correlation with physical exam recommended. 6. Emphysema. Aortic atherosclerosis. Electronically signed by: Morene Hoard MD 08/08/2024 09:03 PM EDT RP Workstation: [id number]   CT C-SPINE NO CHARGE Result Date:  08/08/2024 EXAM: CT CERVICAL SPINE WITHOUT CONTRAST 08/08/2024 08:11:54 PM TECHNIQUE: CT of the cervical spine was performed without the administration of intravenous contrast. Multiplanar reformatted images are provided for review. Automated exposure control, iterative reconstruction, and/or weight based adjustment of the mA/kV was utilized to reduce the radiation dose to as low as reasonably achievable. COMPARISON: None available. CLINICAL HISTORY: Fall, RLE weak. Multiple falls due to RLE weakness. Pt states my right leg keeps giving out. Denies hitting his head or any other pain. FINDINGS: CERVICAL SPINE: BONES AND ALIGNMENT: No acute fracture or traumatic malalignment. Grade 1 anterolisthesis of C4 on C5. Diffusely decreased bone density. DEGENERATIVE CHANGES: Multilevel moderate degenerative changes of the spine. No associated severe osseous neural foraminal or central canal stenosis. SOFT TISSUES: No prevertebral soft tissue swelling. LUNGS (partially visualized): Central lobular and paraseptal at least moderate emphysematous changes. VASCULATURE (partially visualized): Partially visualized right central venous catheter. OTHER: Please see separately dictated CT angiography head and neck. IMPRESSION: 1. No acute abnormality of the cervical spine. Electronically signed by: Morgane Naveau MD 08/08/2024 08:19 PM EDT RP Workstation: [id number]      Assessment/Plan Principal Problem:   TIA (transient ischemic attack)    Right LE weakness -denies pain in back/knee -CT head/neck done -MRI brain pending -consider imaging of hip/spine if MRI negative -PT/OT eval -no SLP as no issues with speech or swallowing -Hgba1c pending -FLP pending (on lipitor  80) -neurology consulted-- seen by tele neuro: ASA/plavix, MRI and tele  Chronic AF -tele - Rate controlled - Off AC due to bleeds  CKD stage IIIA -baseline around 2  Hypothyroidism -on synthroid   BPH -proscar/flomax   HTN -resume home  meds -avoid hypotension  hx of relapsed large B-cell lymphoma   s/p R-CHOP 2019 - s/p RGDP 2023 - recently treated with Epcoritamab    Recurrent UTI -appears to be abx at bedtime for prophylaxis  Chronic systolic CHF -follows with HF clinic- Dr. KATHEE -05/2024 LVEF 25-30%  -GDT as able  OSA -does not tolerate CPAP    DVT prophylaxis: lovenox   Code Status: full  Disposition Plan: pending PT consult Consults called: neurology to see in the AM       Harlene RAYMOND [facility]   How to contact the [facility] Attending or Consulting provider 7A - 7P or covering provider during after hours 7P -7A, for this patient?  Check the care team in [facility] and look for a) attending/consulting [facility] provider listed and b) the [facility] team listed Log into [email or web address] and use 's universal password to access. If you do not have the password, please contact the hospital operator. Locate the [facility] provider you are looking for under [facility] and page to a number that you can be directly reached. If you still have difficulty reaching the provider, please page the [facility] (Director on Call) for the Hospitalists listed on amion for assistance.  08/09/2024, 5:14 PM

## 2024-08-09 NOTE — Plan of Care (Signed)
   Problem: Education: Goal: Knowledge of General Education information will improve Description Including pain rating scale, medication(s)/side effects and non-pharmacologic comfort measures Outcome: Progressing   Problem: Clinical Measurements: Goal: Will remain free from infection Outcome: Progressing   Problem: Clinical Measurements: Goal: Cardiovascular complication will be avoided Outcome: Progressing

## 2024-08-09 NOTE — ED Notes (Signed)
[name] with cl called for transport

## 2024-08-10 ENCOUNTER — Observation Stay (HOSPITAL_BASED_OUTPATIENT_CLINIC_OR_DEPARTMENT_OTHER)

## 2024-08-10 ENCOUNTER — Observation Stay (HOSPITAL_COMMUNITY)

## 2024-08-10 ENCOUNTER — Other Ambulatory Visit (HOSPITAL_COMMUNITY): Payer: Self-pay | Admitting: Family Medicine

## 2024-08-10 DIAGNOSIS — G130 Paraneoplastic neuromyopathy and neuropathy: Secondary | ICD-10-CM | POA: Diagnosis not present

## 2024-08-10 DIAGNOSIS — M4856XA Collapsed vertebra, not elsewhere classified, lumbar region, initial encounter for fracture: Secondary | ICD-10-CM | POA: Diagnosis not present

## 2024-08-10 DIAGNOSIS — M609 Myositis, unspecified: Secondary | ICD-10-CM | POA: Diagnosis not present

## 2024-08-10 DIAGNOSIS — C8330 Diffuse large B-cell lymphoma, unspecified site: Secondary | ICD-10-CM

## 2024-08-10 DIAGNOSIS — I4891 Unspecified atrial fibrillation: Secondary | ICD-10-CM | POA: Diagnosis not present

## 2024-08-10 DIAGNOSIS — E785 Hyperlipidemia, unspecified: Secondary | ICD-10-CM

## 2024-08-10 DIAGNOSIS — I5022 Chronic systolic (congestive) heart failure: Secondary | ICD-10-CM | POA: Diagnosis not present

## 2024-08-10 DIAGNOSIS — G459 Transient cerebral ischemic attack, unspecified: Secondary | ICD-10-CM | POA: Diagnosis not present

## 2024-08-10 DIAGNOSIS — M5410 Radiculopathy, site unspecified: Secondary | ICD-10-CM | POA: Diagnosis not present

## 2024-08-10 DIAGNOSIS — R29898 Other symptoms and signs involving the musculoskeletal system: Secondary | ICD-10-CM

## 2024-08-10 DIAGNOSIS — M48061 Spinal stenosis, lumbar region without neurogenic claudication: Secondary | ICD-10-CM | POA: Diagnosis not present

## 2024-08-10 DIAGNOSIS — M5116 Intervertebral disc disorders with radiculopathy, lumbar region: Secondary | ICD-10-CM | POA: Diagnosis not present

## 2024-08-10 DIAGNOSIS — M4726 Other spondylosis with radiculopathy, lumbar region: Secondary | ICD-10-CM | POA: Diagnosis not present

## 2024-08-10 LAB — LIPID PANEL
Cholesterol: 70 mg/dL (ref 0–200)
HDL: 48 mg/dL (ref >40)
LDL Cholesterol: 14 mg/dL (ref 0–99)
Total CHOL/HDL Ratio: 1.5 ratio
Triglycerides: 42 mg/dL (ref <150)
VLDL: 8 mg/dL (ref 0–40)

## 2024-08-10 LAB — ECHOCARDIOGRAM COMPLETE
Area-P 1/2: 4.21 cm2
Calc EF: 25.8 %
Height: 76 in
S' Lateral: 4.8 cm
Single Plane A2C EF: 29.5 %
Single Plane A4C EF: 29.2 %
Weight: 4080 [oz_av]

## 2024-08-10 LAB — GLUCOSE, CAPILLARY: Glucose-Capillary: 110 mg/dL — ABNORMAL HIGH (ref 70–99)

## 2024-08-10 LAB — HEMOGLOBIN A1C
Hgb A1c MFr Bld: 5.6 % (ref 4.8–5.6)
Mean Plasma Glucose: 114.02 mg/dL

## 2024-08-10 LAB — VITAMIN B12: Vitamin B-12: 907 pg/mL (ref 180–914)

## 2024-08-10 MED ORDER — ATORVASTATIN CALCIUM 40 MG PO TABS
40.0000 mg | ORAL_TABLET | Freq: Every day | ORAL | Status: DC
  Administered 2024-08-11 – 2024-08-21 (×11): 40 mg via ORAL
  Filled 2024-08-10 (×11): qty 1

## 2024-08-10 MED ORDER — PERFLUTREN LIPID MICROSPHERE
1.0000 mL | INTRAVENOUS | Status: AC | PRN
  Administered 2024-08-10: 2 mL via INTRAVENOUS

## 2024-08-10 MED ORDER — GADOBUTROL 1 MMOL/ML IV SOLN
10.0000 mL | Freq: Once | INTRAVENOUS | Status: AC | PRN
  Administered 2024-08-10: 10 mL via INTRAVENOUS

## 2024-08-10 NOTE — Evaluation (Signed)
 Physical Therapy Evaluation Patient Details Name: [name] MRN: [id number] DOB: Jun 14, 1946 Today's Date: 08/10/2024  History of Present Illness  78 y.o. male presents to [facility] 08/08/24 with acute R LE weakness and decreased sensation. CT head negative, MRI negative for acute findings. PMHx:  relapsed large B-cell lymphoma, coronary artery disease, recurrent UTIs, atrial fibrillation (not on blood thinner due to bleeding/hematuria), chronic systolic heart failure.   Clinical Impression  PTA pt was ModI for mobility with use of RW. Pt presents with R LE weakness/impaired coordination/proprioception and impaired balance/gait pattern. Pt was able to perform step-pivot to the left with ModAx2 and [facility]. Intermittent R LE buckling with knee blocked throughout. Pt initially had a posterior lean with assist needed to correct. Increased time needed during transfer with pt reporting having a fear of falling due to history of prior falls. Pt has 24/7 assist available upon d/c home. Recommending >3hrs post acute rehab to maximize rehab potential. Anticipate pt will progress well due to high levels of motivation, strong family support, and positive experience with prior therapy. Acute PT to follow to address functional limitations.        If plan is discharge home, recommend the following: A lot of help with walking and/or transfers;A lot of help with bathing/dressing/bathroom;Assistance with cooking/housework;Assist for transportation;Help with stairs or ramp for entrance   Can travel by private vehicle   No     Equipment Recommendations None recommended by PT  Recommendations for Other Services  Rehab consult    Functional Status Assessment Patient has had a recent decline in their functional status and demonstrates the ability to make significant improvements in function in a reasonable and predictable amount of time.     Precautions / Restrictions Precautions Precautions: Fall Recall of  Precautions/Restrictions: Intact Restrictions Weight Bearing Restrictions Per Provider Order: No      Mobility  Bed Mobility Overal bed mobility: Needs Assistance Bed Mobility: Supine to Sit    Supine to sit: Contact guard, HOB elevated    General bed mobility comments: exited to the left with CGA for safety    Transfers Overall transfer level: Needs assistance Equipment used: 2 person hand held assist Transfers: Sit to/from Stand, Bed to chair/wheelchair/BSC Sit to Stand: Mod assist, +2 physical assistance, +2 safety/equipment   Step pivot transfers: Mod assist, +2 physical assistance, +2 safety/equipment     General transfer comment: ModAx2 to boost-up and steady with R LE blocked. Pt with posterior lean requiring assist to correct. Able to step-pivot to the left with R LE blocked due to occasional buckling    Modified Rankin (Stroke Patients Only) Modified Rankin (Stroke Patients Only) Pre-Morbid Rankin Score: No symptoms Modified Rankin: Moderately severe disability     Balance Overall balance assessment: Needs assistance, History of Falls Sitting-balance support: No upper extremity supported, Feet supported Sitting balance-Leahy Scale: Good     Standing balance support: Bilateral upper extremity supported, During functional activity, Reliant on assistive device for balance Standing balance-Leahy Scale: Poor Standing balance comment: reliant on ModAx2 with R LE buckling       Pertinent Vitals/Pain Pain Assessment Pain Assessment: No/denies pain    Home Living Family/patient expects to be discharged to:: Private residence Living Arrangements: Spouse/significant other Available Help at Discharge: Family;Available 24 hours/day Type of Home: House Home Access: Stairs to enter Entrance Stairs-Rails: Right;Left Entrance Stairs-Number of Steps: 5 Alternate Level Stairs-Number of Steps: 3 (chair lift) Home Layout: Two level Home Equipment: Shower seat;Grab bars  - tub/shower;Grab bars -  toilet;BSC/3in1;Rolling Walker (2 wheels);Rollator (4 wheels);Cane - single point;Wheelchair - manual      Prior Function Prior Level of Function : Independent/Modified Independent;Driving;History of Falls (last six months)    Mobility Comments: ModI with RW, at least two falls due to R LE buckling. ADLs Comments: Ind with ADLs     Extremity/Trunk Assessment   Upper Extremity Assessment Upper Extremity Assessment: Defer to OT evaluation    Lower Extremity Assessment Lower Extremity Assessment: RLE deficits/detail;LLE deficits/detail RLE Deficits / Details: Hip flexion 3+/5, Knee ext 4+/5, Ankle DF 5/5 RLE Sensation: WNL RLE Coordination: decreased gross motor LLE Deficits / Details: Hip flexion 4/5, Knee ext 5/5, Ankle DF 5/5 LLE Sensation: WNL LLE Coordination: WNL    Cervical / Trunk Assessment Cervical / Trunk Assessment: Normal  Communication   Communication Communication: Impaired Factors Affecting Communication: Hearing impaired    Cognition Arousal: Alert Behavior During Therapy: WFL for tasks assessed/performed   PT - Cognitive impairments: No apparent impairments    Following commands: Intact       Cueing Cueing Techniques: Verbal cues, Tactile cues, Visual cues      PT Assessment Patient needs continued PT services  PT Problem List Decreased strength;Decreased activity tolerance;Decreased balance;Decreased mobility       PT Treatment Interventions DME instruction;Gait training;Functional mobility training;Therapeutic activities;Therapeutic exercise;Balance training;Neuromuscular re-education;Patient/family education    PT Goals (Current goals can be found in the Care Plan section)  Acute Rehab PT Goals Patient Stated Goal: to be more independent PT Goal Formulation: With patient/family Time For Goal Achievement: 08/24/24 Potential to Achieve Goals: Good    Frequency Min 3X/week     Co-evaluation   Reason for  Co-Treatment: For patient/therapist safety;To address functional/ADL transfers PT goals addressed during session: Mobility/safety with mobility;Balance;Proper use of DME         AM-PAC PT 6 Clicks Mobility  Outcome Measure Help needed turning from your back to your side while in a flat bed without using bedrails?: A Little Help needed moving from lying on your back to sitting on the side of a flat bed without using bedrails?: A Little Help needed moving to and from a bed to a chair (including a wheelchair)?: Total Help needed standing up from a chair using your arms (e.g., wheelchair or bedside chair)?: Total Help needed to walk in hospital room?: Total Help needed climbing 3-5 steps with a railing? : Total 6 Click Score: 10    End of Session Equipment Utilized During Treatment: Gait belt Activity Tolerance: Patient tolerated treatment well Patient left: in chair;with call bell/phone within reach;with chair alarm set Nurse Communication: Mobility status;Need for lift equipment PT Visit Diagnosis: Unsteadiness on feet (R26.81);Other abnormalities of gait and mobility (R26.89);Muscle weakness (generalized) (M62.81);History of falling (Z91.81)    Time: 9076-9046 PT Time Calculation (min) (ACUTE ONLY): 30 min   Charges:   PT Evaluation $PT Eval Moderate Complexity: 1 Mod   PT General Charges $$ ACUTE PT VISIT: 1 Visit       Kate ORN, PT, DPT Secure Chat Preferred  Rehab Office [phone]   Kate BRAVO [name] 08/10/2024, 10:10 AM

## 2024-08-10 NOTE — Progress Notes (Addendum)
 STROKE TEAM PROGRESS NOTE    SIGNIFICANT HOSPITAL EVENTS: 10/29 - 78 y.o. male who presented with c/o R leg weakness and numbness. MRI and CT without acute abnormalities.  INTERIM HISTORY/SUBJECTIVE: Patient seen at bedside accompanied by husband. Patient reports on Wednesday, he began noticing increased wobbliness of RLE. A few hours later, patient developed loss of strength and sensation in RLE, resulting in a fall w/o LOC or head injury. Patient then had subsequent fall before calling EMS. States around 4 weeks ago he first noticed mild gait instability, now worsening with multiple recent falls. Denies back pain, right upper extremity weakness, or left-sided symptoms.   Notes prior CAR-T therapy caused hypotension and increased fall risk, leading to discontinuation of anticoagulation. CAR-T therapy is expected to continue as a yearly treatment, and the family would like to discuss possible resumption of anticoagulation with cardiology. Patient reports prior hematuria related to a UTI, with no current or recurrent hematuria.  No acute distress. No other FND's other than RLE weakness noted on exam today.  OBJECTIVE:  CBC    Component Value Date/Time   WBC 5.9 08/08/2024 1936   RBC 4.66 08/08/2024 1936   HGB 15.7 08/08/2024 1936   HGB 17.0 06/19/2024 1403   HCT 47.6 08/08/2024 1936   HCT 49.8 06/19/2024 1403   PLT 142 (L) 08/08/2024 1936   PLT 154 06/19/2024 1403   MCV 102.1 (H) 08/08/2024 1936   MCV 102 (H) 06/19/2024 1403   MCH 33.7 08/08/2024 1936   MCHC 33.0 08/08/2024 1936   RDW 15.8 (H) 08/08/2024 1936   RDW 13.6 06/19/2024 1403   LYMPHSABS 1.3 08/08/2024 1936   LYMPHSABS 1.5 08/09/2019 1100   MONOABS 0.5 08/08/2024 1936   EOSABS 0.1 08/08/2024 1936   EOSABS 0.1 08/09/2019 1100   BASOSABS 0.0 08/08/2024 1936   BASOSABS 0.0 08/09/2019 1100    BMET    Component Value Date/Time   NA 137 08/08/2024 1936   NA 138 06/19/2024 1403   K 4.8 08/08/2024 1936   CL 99  08/08/2024 1936   CO2 29 08/08/2024 1936   GLUCOSE 89 08/08/2024 1936   BUN 31 (H) 08/08/2024 1936   BUN 36 (H) 06/19/2024 1403   CREATININE 2.15 (H) 08/08/2024 1936   CREATININE 1.21 05/31/2022 1321   CREATININE 1.30 (H) 05/24/2022 1436   CALCIUM  9.8 08/08/2024 1936   EGFR 36 (L) 06/19/2024 1403   GFRNONAA 31 (L) 08/08/2024 1936   GFRNONAA >60 05/31/2022 1321    IMAGING past 24 hours MR BRAIN WO CONTRAST Result Date: 08/10/2024 CLINICAL DATA:  Initial evaluation for acute neuro deficit, stroke suspected. EXAM: MRI HEAD WITHOUT CONTRAST TECHNIQUE: Multiplanar, multiecho pulse sequences of the brain and surrounding structures were obtained without intravenous contrast. COMPARISON:  Comparison made with CT from 08/08/2024. FINDINGS: Brain: Examination mildly degraded by motion artifact. Diffuse prominence of the CSF containing spaces compatible generalized cerebral atrophy. Patchy and confluent T2/FLAIR hyperintensity involving the periventricular, deep, and subcortical white matter both cerebral hemispheres, nonspecific, but most commonly related to chronic microvascular ischemic disease. Changes are mild for age. Small focus of encephalomalacia and gliosis involving the anterior left frontal lobe (series 6, image 22). Finding favored to be related to prior trauma, although could potentially be related to a remote infarct. No abnormal foci of restricted diffusion to suggest acute or subacute ischemia. Gray-white matter differentiation maintained. No acute intracranial hemorrhage. Single chronic microhemorrhage noted within the inferior right cerebellum. No mass lesion, midline shift or mass effect. No hydrocephalus  or extra-axial fluid collection. Pituitary gland within normal limits. Vascular: Major intracranial vascular flow voids are maintained. Skull and upper cervical spine: Craniocervical junction within normal limits. Bone marrow signal intensity overall within normal limits. No scalp soft  tissue abnormality. Sinuses/Orbits: Prior bilateral ocular lens replacement. Mild mucosal thickening present about the ethmoidal air cells. Paranasal sinuses are otherwise clear. No significant mastoid effusion. Other: Suspected postoperative changes at the right nasopharynx noted. IMPRESSION: 1. No acute intracranial abnormality. 2. Small focus of encephalomalacia and gliosis involving the anterior left frontal lobe, favored to be related to prior trauma, although could potentially be related to a remote infarct. 3. Underlying age-related cerebral atrophy with mild chronic small vessel ischemic disease. Electronically Signed   By: Morene Hoard M.D.   On: 08/10/2024 00:46    Vitals:   08/09/24 2348 08/10/24 0446 08/10/24 0755 08/10/24 1124  BP: (!) 137/110 129/72 115/75 98/78  Pulse: 64 81 67 86  Resp:   18 18  Temp: 98.4 F (36.9 C) 98.4 F (36.9 C) 97.8 F (36.6 C) 98.2 F (36.8 C)  TempSrc: Oral Oral Oral Oral  SpO2: 97% 95% 92% 94%  Weight:      Height:        PHYSICAL EXAM: General:  Alert, well-nourished, well-developed patient in no acute distress Psych:  Mood and affect appropriate for situation CV: Regular rate and rhythm on monitor Respiratory:  Regular, unlabored respirations on room air  NEURO:  Mental Status: AA&Ox3, patient is able to give clear and coherent history Speech/Language: speech is without dysarthria or aphasia.  Naming, repetition, fluency, and comprehension intact.  Cranial Nerves:  II: PERRL. Visual fields full.  III, IV, VI: EOMI. Eyelids elevate symmetrically.  V: Sensation is intact to light touch and symmetrical to face.  VII: Face is symmetrical resting and smiling VIII: hearing intact to voice. IX, X: Palate elevates symmetrically. Phonation is normal.  KP:Dynloizm shrug 5/5. XII: tongue is midline without fasciculations. Motor: 4/5 proximal strength of RLE. 5/5 strength to all other muscle groups tested.  Tone: is normal and bulk is  normal Sensation- Intact to light touch bilaterally in extremities except for bilateral loss of light touch, proprioception, joint sensation in feet distally. Extinction absent to light touch to DSS. Coordination: FTN intact bilaterally, HKS: no ataxia in BLE. No drift.  Gait- deferred  Reflexes: globally decreased 1+  Most Recent NIH 0   ASSESSMENT/PLAN:  Mr. HERON PITCOCK is a 78 y.o. male with history of relapsed large B-cell lymphoma, CAD, recurrent UTI, atrial fibrillation, OSA, hypercholesterolemia, GAD, CKD3, osteoporosis and systolic HF admitted for right leg weakness and numbness.  NIH on Admission 0.  Gradually worsening R leg weakness with recent falls, etiology unclear, radiculopathy vs. chemo-induced neuropathy vs. myositis Per pt husband, pt started to have RLE weakness 4 weeks ago, causing wobbly gait, gradually getting worse. For the last 2 days it was getting even worse, weak when walking with walker and fell 3 times, which prompted him for ER visit. Code Stroke CT head: No acute abnormality.  CTA head & neck: Negative CTA for LVO or other emergent finding. Left carotid bulb atherosclerotic plaque with approximately 50% stenosis. Atheromatous change at the vertebral artery origins with severe stenoses. Carotid siphon atherosclerosis with mild to moderate narrowing bilaterally,worse on the right MRI: No acute intracranial abnormality. Small focus of encephalomalacia and gliosis involving the anterior left frontal lobe, favored to be related to prior trauma, although could potentially be related to a remote  infarct. MRI lumbar spine with and without contrast pending 2D Echo EF 35-40% LDL 14 HgbA1c 5.6 UDS neg Pending CK, aldolase, ESR, CRP and TSH.  B12 level normal. VTE prophylaxis - Lovenox  No antithrombotic prior to admission, now on aspirin  81 mg daily Recommend outpatient EMG for further evaluation of weakness Therapy recommendations:  CIR Disposition:  Pending  Atrial fibrillation not on AC Initially diagnosed around 2001 Off anticoagulation due to hematuria and multiple falls, and also on CAR-T therapy.  Will defer [facility] management decision to outpatient cardiology given finishing CAR-T now and no recurrent hematuria. Rate-controlled Continue telemetry monitoring  Recurrent Diffuse Large B-Cell Lymphoma S/p R-CHOP 2019 S/p RGDP 2023 Recently treated with Epcoritamab   Now finishing CAR-T  Hypertension orthostatic hypotension Home meds: Losartan , Toprol  XL Stable now Orthostatic vitals lying 127/80, sitting 115/69, standing 103/59 Per PT, pt has no complains during PT session Put on TED hose BP goal of normotension  Hyperlipidemia Home meds: Atorvastatin  80 mg + Zetia  10 mg LDL 14, goal < 70 Decrease lipitor  to 40 given low LDL and continue zetia  Continue statin and zetia  at discharge  Other Stroke Risk Factors Obesity, Body mass index is 31.04 kg/m., BMI >/= 30 associated with increased stroke risk, recommend weight loss, diet and exercise as appropriate  Coronary artery disease Congestive heart failure with EF 35-40% Obstructive sleep apnea, not on CPAP, considering Inspire device Former tobacco use  Other Active Problems CKD 3b, Cre 2.15 Anxiety  Hospital day # 0  Patient seen by resident and attending neurologist. Attending neurologist to make changes and additions to above note as necessary.   Ashley Gravely, MD, PGY-1   ATTENDING NOTE: I reviewed above note and agree with the assessment and plan. Pt was seen and examined.   Patient husband at bedside.  Per husband, patient started have wobbly gait about 4 weeks ago due to right lower extremity weakness.  Gradually getting worse and had 3 falls for the last 2 days which promoted then come to ER for evaluation.  MRI no acute infarct.  On exam, right lower extremity 4/5 with drift, more proximal weakness.  DTR 0-1+ throughout, sensory symmetrical except lateral  foot vibration sensation loss.  Gait not tested.  Etiology of her patient right lower extremity subacute weakness is unclear at this time, will check MRI L-spine to evaluate radiculopathy, however patient has only slight lower back pain at this time.  Given chemotherapy, concerning for chemo related neuropathy versus myopathy, however presentation is not symmetrical of the lower extremities.  Paraneoplastic neuropathy or myositis in differential, however again not symmetrical presentation.  Will check CK, aldolase, ESR and CRP as well as TSH in the morning. Pt does have orthostatic hypotension, ordered TED hose.  Continue PT and OT, patient likely need outpatient EMG/NCS study for further evaluation.  Will follow.  For detailed assessment and plan, please refer to above as I have made changes wherever appropriate.   Ary Cummins, MD PhD Stroke Neurology 08/10/2024 4:49 PM  I discussed with Dr. Juvenal and pharmacist. I spent extensive total face-to-face time with the patient and family, reviewing test results, images and medication, and discussing the diagnosis, treatment plan and potential prognosis. This patient's care requiresreview of multiple databases, neurological assessment, discussion with family, other specialists and medical decision making of high complexity.      To contact Stroke Continuity provider, please refer to [email or web address]. After hours, contact General Neurology

## 2024-08-10 NOTE — Evaluation (Signed)
 Occupational Therapy Evaluation Patient Details Name: [name] MRN: [id number] DOB: 1946/05/11 Today's Date: 08/10/2024   History of Present Illness   78 y.o. male presents to [facility] 08/08/24 with acute R LE weakness and decreased sensation. CT head negative, MRI negative for acute findings. PMHx:  relapsed large B-cell lymphoma, coronary artery disease, recurrent UTIs, atrial fibrillation (not on blood thinner due to bleeding/hematuria), chronic systolic heart failure.     Clinical Impressions Pt received in bed, supportive husband Tatiana) present, pt agreeable and motivated to participate with therapies. PTA, pt was indep with ADLs and mobility with RW. Endorses x2 falls recently due to RLE buckling/giving out. Has great support from his husband upon d/c. Functionally, pt required up to min A for seated LB ADLs and was setup for self-feeding. Mod A +2 to stand and take few steps to chair via b/l HHA. Initially retropulsive, also with nBOS upon standing, somewhat limited by fear of falling but able to be redirected with reassurance. Anticipate inc assist needed for all standing ADLs.   Pt is currently functioning below baseline and would benefit from ongoing acute OT services to progress towards safe discharge and to facilitate return to prior level of function. Current recommendation is high-intensity post-acute rehab (> 3 hours/day).     If plan is discharge home, recommend the following:   Two people to help with walking and/or transfers;A lot of help with bathing/dressing/bathroom;Assistance with cooking/housework;Assist for transportation;Help with stairs or ramp for entrance     Functional Status Assessment   Patient has had a recent decline in their functional status and demonstrates the ability to make significant improvements in function in a reasonable and predictable amount of time.     Equipment Recommendations   Other (comment) (defer to next level of care)      Recommendations for Other Services   Rehab consult     Precautions/Restrictions   Precautions Precautions: Fall Recall of Precautions/Restrictions: Intact Restrictions Weight Bearing Restrictions Per Provider Order: No     Mobility Bed Mobility Overal bed mobility: Needs Assistance Bed Mobility: Supine to Sit     Supine to sit: Contact guard, HOB elevated, Used rails     General bed mobility comments: inc effort for manaing LE's towards EOB    Transfers Overall transfer level: Needs assistance Equipment used: 2 person hand held assist Transfers: Sit to/from Stand, Bed to chair/wheelchair/BSC Sit to Stand: Mod assist, +2 physical assistance, +2 safety/equipment     Step pivot transfers: Mod assist, +2 physical assistance, +2 safety/equipment     General transfer comment: Stood from bed with assist for powering up, cued for safe approach to chair and hand placement to control descent into recliner      Balance Overall balance assessment: Needs assistance, History of Falls Sitting-balance support: No upper extremity supported, Feet supported Sitting balance-Leahy Scale: Fair Sitting balance - Comments: intermittent CGA sitting unsupported EOB   Standing balance support: Bilateral upper extremity supported, During functional activity Standing balance-Leahy Scale: Poor Standing balance comment: initially retropulsive upon standing, observed nBOS with feet pointed outward; requires RLE block when weightshifting in preparation for taking steps                           ADL either performed or assessed with clinical judgement   ADL Overall ADL's : Needs assistance/impaired Eating/Feeding: Set up   Grooming: Set up;Sitting Grooming Details (indicate cue type and reason): anticipate inc A needed for standing  grooming         Upper Body Dressing : Contact guard assist;Bed level Upper Body Dressing Details (indicate cue type and reason): donned clean  gown; anticipate inc A needed for sitting unsupported 2/2 reduced core strength (per pt report) Lower Body Dressing: Minimal assistance;Sitting/lateral leans Lower Body Dressing Details (indicate cue type and reason): figure four method for donning RLE sock, attempted via hip hike but unsuccessful 2/2 reduced core strength/control     Toileting- Clothing Manipulation and Hygiene: Maximal assistance Toileting - Clothing Manipulation Details (indicate cue type and reason): external male catheter intact, anticipate inc A needed for other components of toileting     Functional mobility during ADLs: Moderate assistance;+2 for physical assistance;Rolling walker (2 wheels)       Vision Baseline Vision/History: 1 Wears glasses Ability to See in Adequate Light: 0 Adequate Patient Visual Report: No change from baseline Vision Assessment?: Wears glasses for reading     Perception         Praxis         Pertinent Vitals/Pain Pain Assessment Pain Assessment: No/denies pain     Extremity/Trunk Assessment Upper Extremity Assessment Upper Extremity Assessment: Generalized weakness   Lower Extremity Assessment Lower Extremity Assessment: Defer to PT evaluation RLE Deficits / Details: Hip flexion 3+/5, Knee ext 4+/5, Ankle DF 5/5 RLE Sensation: WNL RLE Coordination: decreased gross motor LLE Deficits / Details: Hip flexion 4/5, Knee ext 5/5, Ankle DF 5/5 LLE Sensation: WNL LLE Coordination: WNL   Cervical / Trunk Assessment Cervical / Trunk Assessment: Normal   Communication Communication Communication: Impaired Factors Affecting Communication: Hearing impaired (b/l hearing aides utilized during session)   Cognition Arousal: Alert Behavior During Therapy: WFL for tasks assessed/performed Cognition: No apparent impairments             OT - Cognition Comments: pleasant & highly motivated to work with therapies                 Following commands: Intact        Cueing  General Comments   Cueing Techniques: Verbal cues;Tactile cues;Visual cues  supportive husband Tatiana) present; skin tear observed to anterior aspect of R ankle (RN aware) - pt reports getting foot caught on w/c in ED   Exercises     Shoulder Instructions      Home Living Family/patient expects to be discharged to:: Private residence Living Arrangements: Spouse/significant other Available Help at Discharge: Family;Available 24 hours/day Type of Home: House Home Access: Stairs to enter [employer] of Steps: 5 Entrance Stairs-Rails: Right;Left Home Layout: Two level Alternate Level Stairs-Number of Steps: 3 (chair lift) Alternate Level Stairs-Rails: Right Bathroom Shower/Tub: Producer, Television/film/video: Handicapped height     Home Equipment: Shower seat;Grab bars - tub/shower;Grab bars - toilet;BSC/3in1;Rolling Walker (2 wheels);Rollator (4 wheels);Cane - single point;Wheelchair - manual          Prior Functioning/Environment Prior Level of Function : Independent/Modified Independent;Driving;History of Falls (last six months)             Mobility Comments: using RW for mobility PTA, endorses x2 falls due to RLE buckling and giving way ADLs Comments: indep    OT Problem List: Decreased strength;Decreased range of motion;Decreased activity tolerance;Impaired balance (sitting and/or standing)   OT Treatment/Interventions: Self-care/ADL training;Therapeutic exercise;DME and/or AE instruction;Therapeutic activities;Patient/family education;Balance training      OT Goals(Current goals can be found in the care plan section)   Acute Rehab OT Goals Patient Stated Goal: get  better and regain his independence OT Goal Formulation: With patient/family Time For Goal Achievement: 08/24/24 Potential to Achieve Goals: Good   OT Frequency:  Min 2X/week    Co-evaluation PT/OT/SLP Co-Evaluation/Treatment: Yes Reason for Co-Treatment: For  patient/therapist safety;To address functional/ADL transfers PT goals addressed during session: Mobility/safety with mobility;Balance;Proper use of DME OT goals addressed during session: ADL's and self-care      AM-PAC OT 6 Clicks Daily Activity     Outcome Measure Help from another person eating meals?: None Help from another person taking care of personal grooming?: A Little Help from another person toileting, which includes using toliet, bedpan, or urinal?: A Lot Help from another person bathing (including washing, rinsing, drying)?: A Lot Help from another person to put on and taking off regular upper body clothing?: A Little Help from another person to put on and taking off regular lower body clothing?: A Little 6 Click Score: 17   End of Session Equipment Utilized During Treatment: Gait belt Nurse Communication: Mobility status;Need for lift equipment  Activity Tolerance: Patient tolerated treatment well Patient left: in chair;with call bell/phone within reach;with chair alarm set;with family/visitor present  OT Visit Diagnosis: Unsteadiness on feet (R26.81);Repeated falls (R29.6);History of falling (Z91.81)                Time: 9077-9047 OT Time Calculation (min): 30 min Charges:  OT General Charges $OT Visit: 1 Visit OT Evaluation $OT Eval Moderate Complexity: 1 Mod  Joseph BIRCH., MSOT, OTR/L Acute Rehabilitation Services [phone] Secure Chat Preferred  [name] 08/10/2024, 11:16 AM

## 2024-08-10 NOTE — Progress Notes (Signed)
 PROGRESS NOTE    [name]  FMW:[id number] DOB: Oct 27, 1945 DOA: 08/08/2024 PCP: Garald Karlynn GAILS, MD    Brief Narrative:  [name] is a 78 y.o. male with medical history significant of history of relapsed large B-cell lymphoma, coronary artery disease, recurrent UTIs, atrial fibrillation (not on blood thinner due to bleeding/hematuria), chronic systolic heart failure.  Patient was in his normal health on 10/29 when he attended a heart failure clinic appointment.  After the doctor appointment he had acute right leg weakness-he describes as unable to stand as it gives out.  His strength in bed remains intact.  He also had some loss of sensation that has started to resolve.  Patient denies any back pain, hip pain or knee pain.  He was seen by teleneurology at [facility] and they recommended MRI of the brain, Plavix loading dose and then daily Plavix as well as aspirin .  They also recommended permissive hypertension for 24 to 48 hours   Family has noticed a difference in gait and strength over the last month  Assessment and Plan: Right LE weakness - Family reports several falls in the last few days -CT head/neck done -MRI brain without acute issues -consider imaging of hip/spine if MRI negative -PT/OT eval -no SLP as no issues with speech or swallowing -Hgba1c less than 7 -FLP pending (on lipitor  80)-LDL 14 -neurology consulted and will see 10/31   Chronic AF -tele - Rate controlled - Off AC due to bleeds and sees from chart review appears to be related to hematuria but also patient is a fall risk   CKD stage IIIA -baseline around 2   Hypothyroidism -on synthroid    BPH -proscar/flomax    HTN -resume home meds -avoid hypotension   hx of relapsed large B-cell lymphoma   s/p R-CHOP 2019 - s/p RGDP 2023 - recently treated with Epcoritamab     Recurrent UTI -appears to be abx at bedtime for prophylaxis   Chronic systolic CHF -follows with HF clinic- Dr.  KATHEE -05/2024 LVEF 25-30%  -GDT as able   OSA -does not tolerate CPAP   DVT prophylaxis: enoxaparin  (LOVENOX ) injection 40 mg Start: 08/09/24 1800    Code Status: Full Code Family Communication:   Disposition Plan:  Level of care: Telemetry Status is: Observation     Consultants:  Neurology  Subjective: Patient noted truncal weakness prior to right leg weakness  Objective: Vitals:   08/09/24 2348 08/10/24 0446 08/10/24 0755 08/10/24 1124  BP: (!) 137/110 129/72 115/75 98/78  Pulse: 64 81 67 86  Resp:   18 18  Temp: 98.4 F (36.9 C) 98.4 F (36.9 C) 97.8 F (36.6 C) 98.2 F (36.8 C)  TempSrc: Oral Oral Oral Oral  SpO2: 97% 95% 92% 94%  Weight:      Height:        Intake/Output Summary (Last 24 hours) at 08/10/2024 1300 Last data filed at 08/10/2024 0800 Gross per 24 hour  Intake 270.67 ml  Output 750 ml  Net -479.33 ml   Filed Weights   08/08/24 1854  Weight: 115.7 kg    Examination:   General: Appearance:    Obese male in no acute distress     Lungs:      respirations unlabored  Heart:    Normal heart rate.   MS:   All extremities are intact.    Neurologic:   Awake, alert, oriented x 3. No apparent focal neurological  defect.        Data Reviewed: I have personally reviewed following labs and imaging studies  CBC: Recent Labs  Lab 08/08/24 1936  WBC 5.9  NEUTROABS 3.8  HGB 15.7  HCT 47.6  MCV 102.1*  PLT 142*   Basic Metabolic Panel: Recent Labs  Lab 08/08/24 1049 08/08/24 1936  NA 138 137  K 4.9 4.8  CL 95* 99  CO2 28 29  GLUCOSE 75 89  BUN 32* 31*  CREATININE 2.00* 2.15*  CALCIUM  9.2 9.8   GFR: Estimated Creatinine Clearance: 39.4 mL/min (A) (by C-G formula based on SCr of 2.15 mg/dL (H)). Liver Function Tests: Recent Labs  Lab 08/08/24 1936  AST 32  ALT 24  ALKPHOS 74  BILITOT 0.7  PROT 6.0*  ALBUMIN  3.8   No results for input(s): LIPASE, AMYLASE in the last 168 hours. No results for input(s):  AMMONIA in the last 168 hours. Coagulation Profile: Recent Labs  Lab 08/08/24 1936  INR 1.0   Cardiac Enzymes: No results for input(s): CKTOTAL, CKMB, CKMBINDEX, TROPONINI in the last 168 hours. BNP (last 3 results) No results for input(s): PROBNP in the last 8760 hours. HbA1C: Recent Labs    08/10/24 0139  HGBA1C 5.6   CBG: Recent Labs  Lab 08/08/24 1854 08/10/24 0636  GLUCAP 97 110*   Lipid Profile: Recent Labs    08/10/24 0139  CHOL 70  HDL 48  LDLCALC 14  TRIG 42  CHOLHDL 1.5   Thyroid  Function Tests: No results for input(s): TSH, T4TOTAL, FREET4, T3FREE, THYROIDAB in the last 72 hours. Anemia Panel: Recent Labs    08/10/24 0139  VITAMINB12 907   Sepsis Labs: No results for input(s): PROCALCITON, LATICACIDVEN in the last 168 hours.  No results found for this or any previous visit (from the past 240 hours).       Radiology Studies: MR BRAIN WO CONTRAST Result Date: 08/10/2024 CLINICAL DATA:  Initial evaluation for acute neuro deficit, stroke suspected. EXAM: MRI HEAD WITHOUT CONTRAST TECHNIQUE: Multiplanar, multiecho pulse sequences of the brain and surrounding structures were obtained without intravenous contrast. COMPARISON:  Comparison made with CT from 08/08/2024. FINDINGS: Brain: Examination mildly degraded by motion artifact. Diffuse prominence of the CSF containing spaces compatible generalized cerebral atrophy. Patchy and confluent T2/FLAIR hyperintensity involving the periventricular, deep, and subcortical white matter both cerebral hemispheres, nonspecific, but most commonly related to chronic microvascular ischemic disease. Changes are mild for age. Small focus of encephalomalacia and gliosis involving the anterior left frontal lobe (series 6, image 22). Finding favored to be related to prior trauma, although could potentially be related to a remote infarct. No abnormal foci of restricted diffusion to suggest acute or  subacute ischemia. Gray-white matter differentiation maintained. No acute intracranial hemorrhage. Single chronic microhemorrhage noted within the inferior right cerebellum. No mass lesion, midline shift or mass effect. No hydrocephalus or extra-axial fluid collection. Pituitary gland within normal limits. Vascular: Major intracranial vascular flow voids are maintained. Skull and upper cervical spine: Craniocervical junction within normal limits. Bone marrow signal intensity overall within normal limits. No scalp soft tissue abnormality. Sinuses/Orbits: Prior bilateral ocular lens replacement. Mild mucosal thickening present about the ethmoidal air cells. Paranasal sinuses are otherwise clear. No significant mastoid effusion. Other: Suspected postoperative changes at the right nasopharynx noted. IMPRESSION: 1. No acute intracranial abnormality. 2. Small focus of encephalomalacia and gliosis involving the anterior left frontal lobe, favored to be related to prior trauma, although could potentially be related to a remote  infarct. 3. Underlying age-related cerebral atrophy with mild chronic small vessel ischemic disease. Electronically Signed   By: Morene Hoard M.D.   On: 08/10/2024 00:46   CT ANGIO HEAD NECK W WO CM Result Date: 08/08/2024 EXAM: CTA Head and Neck with Intravenous Contrast. CT Head without Contrast. CLINICAL HISTORY: Neuro deficit, acute, stroke suspected. Multiple falls due to RLE weakness. Pt states my right leg keeps giving out. Denies hitting his head or any other pain. TECHNIQUE: Axial CTA images of the head and neck performed with intravenous contrast. MIP reconstructed images were created and reviewed. Axial computed tomography images of the head/brain performed without intravenous contrast. Note: Per PQRS, the description of internal carotid artery percent stenosis, including 0 percent or normal exam, is based on [employer] American Symptomatic Carotid Endarterectomy Trial (NASCET)  criteria. Dose reduction technique was used including one or more of the following: automated exposure control, adjustment of mA and kV according to patient size, and/or iterative reconstruction. CONTRAST: Without and with IV contrast. 50 mL (iohexol  (OMNIPAQUE ) 350 MG/ML injection 50 mL IOHEXOL  350 MG/ML SOLN). COMPARISON: Comparison with prior study from 08/13/2023. FINDINGS: CT HEAD: BRAIN: Generalized age related cerebral atrophy with chronic microvascular ischemic disease. Calcified atherosclerosis present about the skull base. No acute intraparenchymal hemorrhage. No mass lesion. No CT evidence for acute territorial infarct. No midline shift or extra-axial collection. VENTRICLES: No hydrocephalus. ORBITS: The orbits are unremarkable. SINUSES AND MASTOIDS: The paranasal sinuses and mastoid air cells are clear. CTA NECK: COMMON CAROTID ARTERIES: Atheromatous change about the right carotid bulb without hemodynamically significant greater than 50% stenosis. Calcified plaque about the left carotid bulb with estimated 50% stenosis by NASCET criteria. No dissection or occlusion. INTERNAL CAROTID ARTERIES: Atheromatous change about the carotid siphons with associated mild to moderate narrowing bilaterally, worse on the right. No dissection or occlusion. VERTEBRAL ARTERIES: Atheromatous change at the origins of both vertebral arteries with severe stenoses. Proximal left V4 segment not well seen due to venous contamination. Mild atheromatous change about the intradural V4 segments without significant stenosis. No dissection or occlusion. CTA HEAD: ANTERIOR CEREBRAL ARTERIES: No significant stenosis. No occlusion. No aneurysm. MIDDLE CEREBRAL ARTERIES: No significant stenosis. No occlusion. No aneurysm. POSTERIOR CEREBRAL ARTERIES: Fetal type origin of the left PCA. No significant stenosis. No occlusion. No aneurysm. BASILAR ARTERY: No significant stenosis. No occlusion. No aneurysm. OTHER: SOFT TISSUES: 3.7 x 3.1 cm  soft tissue nodular lesion seen at the subcutaneous fat of the right lower neck (series 4, image 70) nonspecific, but possibly related to history of lymphoma. Right side of port a cath in place. BONES: Moderate to advanced spondylosis at C5-C6 and C6-C7. Mild chronic compression of 4 mm at the superior endplate of T3 through T5. No acute osseous abnormality. LUNGS: Emphysema. AORTA: Aortic atherosclerosis. IMPRESSION: 1. Negative CTA for LVO or other emergent finding. 2. Left carotid bulb atherosclerotic plaque with approximately 50% stenosis by NASCET criteria. 3. Atheromatous change at the vertebral artery origins with severe stenoses. . 4. Carotid siphon atherosclerosis with mild to moderate narrowing bilaterally, worse on the right. 5. 3.7 cm soft tissue mass involving the soft tissues of the right lower neck indeterminate but possibly related to patient history of lymphoma. Correlation with physical exam recommended. 6. Emphysema. Aortic atherosclerosis. Electronically signed by: Morene Hoard MD 08/08/2024 09:03 PM EDT RP Workstation: [id number]   CT C-SPINE NO CHARGE Result Date: 08/08/2024 EXAM: CT CERVICAL SPINE WITHOUT CONTRAST 08/08/2024 08:11:54 PM TECHNIQUE: CT of the cervical spine was performed without the  administration of intravenous contrast. Multiplanar reformatted images are provided for review. Automated exposure control, iterative reconstruction, and/or weight based adjustment of the mA/kV was utilized to reduce the radiation dose to as low as reasonably achievable. COMPARISON: None available. CLINICAL HISTORY: Fall, RLE weak. Multiple falls due to RLE weakness. Pt states my right leg keeps giving out. Denies hitting his head or any other pain. FINDINGS: CERVICAL SPINE: BONES AND ALIGNMENT: No acute fracture or traumatic malalignment. Grade 1 anterolisthesis of C4 on C5. Diffusely decreased bone density. DEGENERATIVE CHANGES: Multilevel moderate degenerative changes of the spine. No  associated severe osseous neural foraminal or central canal stenosis. SOFT TISSUES: No prevertebral soft tissue swelling. LUNGS (partially visualized): Central lobular and paraseptal at least moderate emphysematous changes. VASCULATURE (partially visualized): Partially visualized right central venous catheter. OTHER: Please see separately dictated CT angiography head and neck. IMPRESSION: 1. No acute abnormality of the cervical spine. Electronically signed by: Morgane Naveau MD 08/08/2024 08:19 PM EDT RP Workstation: [id number]        Scheduled Meds:  acyclovir   400 mg Oral BID   allopurinol   100 mg Oral Daily   aspirin   81 mg Oral Daily   atorvastatin   80 mg Oral Daily   buPROPion   300 mg Oral Daily   cephALEXin   250 mg Oral QHS   clopidogrel  75 mg Oral Daily   DULoxetine   60 mg Oral BID   enoxaparin  (LOVENOX ) injection  40 mg Subcutaneous Q24H   ezetimibe   10 mg Oral QHS   finasteride  5 mg Oral Daily   levothyroxine   125 mcg Oral Q0600   metoprolol  succinate  25 mg Oral Daily   mirabegron  ER  25 mg Oral Daily   pantoprazole   40 mg Oral Daily   tamsulosin   0.4 mg Oral Daily   Continuous Infusions:  sodium chloride  Stopped (08/09/24 2234)     LOS: 0 days    Time spent: 45 minutes spent on chart review, discussion with nursing staff, consultants, updating family and interview/physical exam; more than 50% of that time was spent in counseling and/or coordination of care.    Harlene RAYMOND Bowl, DO [facility] Available via Epic secure chat 7am-7pm After these hours, please refer to coverage provider listed on [email or web address] 08/10/2024, 1:00 PM

## 2024-08-11 DIAGNOSIS — R29898 Other symptoms and signs involving the musculoskeletal system: Secondary | ICD-10-CM | POA: Diagnosis not present

## 2024-08-11 DIAGNOSIS — M609 Myositis, unspecified: Secondary | ICD-10-CM | POA: Diagnosis not present

## 2024-08-11 DIAGNOSIS — C8330 Diffuse large B-cell lymphoma, unspecified site: Secondary | ICD-10-CM | POA: Diagnosis not present

## 2024-08-11 DIAGNOSIS — I951 Orthostatic hypotension: Secondary | ICD-10-CM | POA: Diagnosis not present

## 2024-08-11 DIAGNOSIS — M5410 Radiculopathy, site unspecified: Secondary | ICD-10-CM | POA: Diagnosis not present

## 2024-08-11 DIAGNOSIS — G459 Transient cerebral ischemic attack, unspecified: Secondary | ICD-10-CM | POA: Diagnosis not present

## 2024-08-11 DIAGNOSIS — G130 Paraneoplastic neuromyopathy and neuropathy: Secondary | ICD-10-CM | POA: Diagnosis not present

## 2024-08-11 LAB — BASIC METABOLIC PANEL WITH GFR
Anion gap: 12 (ref 5–15)
BUN: 27 mg/dL — ABNORMAL HIGH (ref 8–23)
CO2: 26 mmol/L (ref 22–32)
Calcium: 8.9 mg/dL (ref 8.9–10.3)
Chloride: 99 mmol/L (ref 98–111)
Creatinine, Ser: 1.98 mg/dL — ABNORMAL HIGH (ref 0.61–1.24)
GFR, Estimated: 34 mL/min — ABNORMAL LOW (ref >60)
Glucose, Bld: 102 mg/dL — ABNORMAL HIGH (ref 70–99)
Potassium: 4.1 mmol/L (ref 3.5–5.1)
Sodium: 137 mmol/L (ref 135–145)

## 2024-08-11 LAB — SEDIMENTATION RATE: Sed Rate: 2 mm/h (ref 0–16)

## 2024-08-11 LAB — CBC
HCT: 49.5 % (ref 39.0–52.0)
Hemoglobin: 16.7 g/dL (ref 13.0–17.0)
MCH: 34.1 pg — ABNORMAL HIGH (ref 26.0–34.0)
MCHC: 33.7 g/dL (ref 30.0–36.0)
MCV: 101 fL — ABNORMAL HIGH (ref 80.0–100.0)
Platelets: 138 K/uL — ABNORMAL LOW (ref 150–400)
RBC: 4.9 MIL/uL (ref 4.22–5.81)
RDW: 15.7 % — ABNORMAL HIGH (ref 11.5–15.5)
WBC: 7.7 K/uL (ref 4.0–10.5)
nRBC: 0 % (ref 0.0–0.2)

## 2024-08-11 LAB — TSH: TSH: 31.742 u[IU]/mL — ABNORMAL HIGH (ref 0.350–4.500)

## 2024-08-11 LAB — C-REACTIVE PROTEIN: CRP: 0.6 mg/dL (ref <1.0)

## 2024-08-11 LAB — CK: Total CK: 58 U/L (ref 49–397)

## 2024-08-11 LAB — T4, FREE: Free T4: 0.86 ng/dL (ref 0.61–1.12)

## 2024-08-11 NOTE — Progress Notes (Signed)
 Physical Therapy Treatment Patient Details Name: [name] MRN: [id number] DOB: 09-Jun-1946 Today's Date: 08/11/2024   History of Present Illness 78 y.o. male presents to [facility] 08/08/24 with acute R LE weakness and decreased sensation. CT head/MRI negative. 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 MRI lumbar spine showed severe chronic compression deformity of L1, atrophy of R psoas muscle PMHx: relapsed large B-cell lymphoma, coronary artery disease, recurrent UTIs, atrial fibrillation (not on blood thinner due to bleeding/hematuria), chronic systolic heart failure.    PT Comments  Pt received in supine with spouse present and eager for mobility. Focused session on exercises to work on LLE strength and pension scheme manager. Utilized Stedy due to need for +2 to work on step-pivots and investment banker, operational. Pt able to stand with Stedy and MinA/ModA for boost-up and occasional posterior lean. In the [place], pt was able to weight-shift bilaterally and march in place with slight instability of R LE. No overt buckling observed in today's session. Encouraged pt to perform seated/supine LE exercises throughout the day and mobilize with assist of staff and Stedy. Continue to recommend >3hrs post acute rehab with acute PT to follow.     If plan is discharge home, recommend the following: A lot of help with walking and/or transfers;A lot of help with bathing/dressing/bathroom;Assistance with cooking/housework;Assist for transportation;Help with stairs or ramp for entrance   Can travel by private vehicle      No  Equipment Recommendations  None recommended by PT    Recommendations for Other Services Rehab consult     Precautions / Restrictions Precautions Precautions: Fall Recall of Precautions/Restrictions: Intact Restrictions Weight Bearing Restrictions Per Provider Order: No     Mobility  Bed Mobility Overal bed mobility: Needs Assistance Bed Mobility: Supine to Sit     Supine to sit: Contact guard, HOB elevated    General bed mobility comments: exited to the left with CGA for safety    Transfers Overall transfer level: Needs assistance Equipment used: Ambulation equipment used Transfers: Sit to/from Stand, Bed to chair/wheelchair/BSC Sit to Stand: Mod assist, Via lift equipment, Min assist    General transfer comment: fluctuated between ModA to MinA using Stedy with assist to boost-up and correct posterior lean Transfer via Lift Equipment: Stedy  Ambulation/Gait  Pre-gait activities: Bx10 weight shifting with UE support (Stedy), Bx10 marches in standing with UE support (Stedy) General Gait Details: deferred due to need for +2      Modified Rankin (Stroke Patients Only) Modified Rankin (Stroke Patients Only) Pre-Morbid Rankin Score: No symptoms Modified Rankin: Moderately severe disability     Balance Overall balance assessment: Needs assistance, History of Falls Sitting-balance support: No upper extremity supported, Feet supported Sitting balance-Leahy Scale: Good     Standing balance support: Bilateral upper extremity supported, During functional activity, Reliant on assistive device for balance Standing balance-Leahy Scale: Poor Standing balance comment: reliant on UE and external support, occasional posterior lean     Communication Communication Communication: Impaired Factors Affecting Communication: Hearing impaired  Cognition Arousal: Alert Behavior During Therapy: WFL for tasks assessed/performed   PT - Cognitive impairments: No apparent impairments    Following commands: Intact      Cueing Cueing Techniques: Verbal cues, Tactile cues, Visual cues  Exercises General Exercises - Lower Extremity Quad Sets: AROM, Right, 5 reps, Supine (x5 sec hold) Long Arc Quad: AROM, Right, 10 reps, Seated (x5 sec hold) Heel Slides: AROM, Right, Supine (3 reps) Other Exercises Other Exercises: x3 sit<>stand with Herby, ModA/MinA  Pertinent Vitals/Pain Pain Assessment Pain Assessment: No/denies pain     PT Goals (current goals can now be found in the care plan section) Acute Rehab PT Goals PT Goal Formulation: With patient/family Time For Goal Achievement: 08/24/24 Potential to Achieve Goals: Good Progress towards PT goals: Progressing toward goals    Frequency    Min 3X/week       AM-PAC PT 6 Clicks Mobility   Outcome Measure  Help needed turning from your back to your side while in a flat bed without using bedrails?: A Little Help needed moving from lying on your back to sitting on the side of a flat bed without using bedrails?: A Little Help needed moving to and from a bed to a chair (including a wheelchair)?: Total Help needed standing up from a chair using your arms (e.g., wheelchair or bedside chair)?: Total Help needed to walk in hospital room?: Total Help needed climbing 3-5 steps with a railing? : Total 6 Click Score: 10    End of Session Equipment Utilized During Treatment: Gait belt Activity Tolerance: Patient tolerated treatment well Patient left: in chair;with call bell/phone within reach;with chair alarm set;with family/visitor present Nurse Communication: Mobility status;Need for lift equipment PT Visit Diagnosis: Unsteadiness on feet (R26.81);Other abnormalities of gait and mobility (R26.89);Muscle weakness (generalized) (M62.81);History of falling (Z91.81)     Time: 1040-1107 PT Time Calculation (min) (ACUTE ONLY): 27 min  Charges:    $Therapeutic Exercise: 8-22 mins $Therapeutic Activity: 8-22 mins PT General Charges $$ ACUTE PT VISIT: 1 Visit                     Kate ORN, PT, DPT Secure Chat Preferred  Rehab Office [phone]    Kate BRAVO [name] 08/11/2024, 1:16 PM

## 2024-08-11 NOTE — PMR Pre-admission (Shared)
 PMR Admission Coordinator Pre-Admission Assessment  Patient: [name] is an 78 y.o., male MRN: [id number] DOB: 1946/03/16 Height: 6' 4 (193 cm) Weight: 115.7 kg  Insurance Information HMO:     PPO: yes     PCP:      IPA:      80/20:      OTHER:  PRIMARY: [employer]      Policy#: [id number]      Subscriber: patient CM Name: ***      Phone#: ***     Fax#: [phone]  Pre-Cert#: [id number]      Employer:  Benefits:  Phone #: online at [email or web address]     Name:  Eff. Date: 10/12/19-still active     Deduct: $0      Out of Pocket Max: $3,300 ($2,022.32 met)      Life Max: NA CIR: $125/admission for 10 days      SNF: 100% coverage for days 1-20, $50 co-pay for days 21-100 Outpatient: $20/visit     Co-Pay:  Home Health: 100% coverage      Co-Pay:  DME: 80% coverage     Co-Pay: 20%  Providers: in-network SECONDARY:       Policy#:      Phone#:   Artist:       Phone#:   The Data Processing Manager" for patients in Inpatient Rehabilitation Facilities with attached "Privacy Act Statement-Health Care Records" was provided and verbally reviewed with: Patient  Emergency Contact Information Contact Information     Name Relation Home Work Mobile   Ruggles,Michael Spouse  [phone]      Other Contacts   None on File     Current Medical History  Patient Admitting Diagnosis: spinal stenosis, toxic myositis History of Present Illness: Pt is a 78 year old male with medical hx significant for: CAD, B-call lymphoma, CAD, CHF, A-fib. Pt presented to [facility] ED at [facility] on 08/08/24 d/t transient right sided weakness and numbness. Pt had 2 falls on day of presentation to ED. CT head negative for acute abnormalities. CTA head/neck negative for LVO. Neurology consulted. Pt transferred to [facility] on 08/09/24. MRI negative for acute abnormalities. MRI lumbar spine revealed severe chronic compression deformity of L1 with 8 mm bony retropulsion  and focal kyphotic angulation at thoracolumar junction resulting in moderate spinal stenosis, but no cord signal changes. Therapy evaluations completed and CIR recommended d/t pt's deficits in functional mobility. Complete NIHSS TOTAL: 0  Patient's medical record from [facility] has been reviewed by the rehabilitation admission coordinator and physician.  Past Medical History  Past Medical History:  Diagnosis Date   Allergy 11/14/1975   Benign localized prostatic hyperplasia with lower urinary tract symptoms (LUTS)    Blood transfusion without reported diagnosis    CAD (coronary artery disease) cardiologist--  dr t. raford   hx of known CAD obstruction w/ collaterals (cath done @ [facility] 12-31-2011) ;   last cardiac cath 08-13-2019  showed sig. 2V CAD involving proxLAD and CTO of the RCA/  chronic total occlusion midRCA w/ bridging and L>R collaterals   Cancer (HCC) 06/17/2018   Chronic kidney disease    Diastolic CHF (HCC)    dx 06-15-2019 hospital admission (followed by pcp)   Diffuse large B cell lymphoma [facility]) oncologist-- dr onesimo--- in remission   dx 08/ 2019 -- bx left tonsill mass-- involving lymph nodes--- completed chemotherapy 10-12-2018   DJD (degenerative joint disease)    Dysthymic disorder  Environmental allergies    GAD (generalized anxiety disorder)    with dysthymia   GERD (gastroesophageal reflux disease)    History of falling    multiple times   History of kidney stones    HLD (hyperlipidemia)    Hyperlipidemia    Hypertension    Hypogonadism in male    OA (osteoarthritis)    OSA on CPAP    cpap machine-settings 17   Osteoporosis with fracture 04/24/2013   Persistent atrial fibrillation (HCC)    On Xarelto , cardiologist--- dr jonelle. [place]   Plaque psoriasis    dermatologist--- dr ryan gammon--- currently taking otezla    Post-surgical hypothyroidism    followed by pcp---  s/p total thyroidectomy for graves disease in 1987   Sleep apnea     Wears hearing aid in both ears     Has the patient had major surgery during 100 days prior to admission? No  Family History   family history includes Alcohol  abuse in his father and sister; Arthritis in his mother; Asthma in his father; CAD (age of onset: 38) in his father.  Current Medications  Current Facility-Administered Medications:    acetaminophen  (TYLENOL ) tablet 650 mg, 650 mg, Oral, Q4H PRN, 650 mg at 08/12/24 0446 **OR** acetaminophen  (TYLENOL ) 160 MG/5ML solution 650 mg, 650 mg, Per Tube, Q4H PRN **OR** acetaminophen  (TYLENOL ) suppository 650 mg, 650 mg, Rectal, Q4H PRN, Vann, Jessica U, DO   acyclovir  (ZOVIRAX ) tablet 400 mg, 400 mg, Oral, BID, Vann, Jessica U, DO, 400 mg at 08/15/24 1002   albuterol  (PROVENTIL ) (2.5 MG/3ML) 0.083% nebulizer solution 2.5 mg, 2.5 mg, Nebulization, Q4H PRN, Uhlorn, Garett M, RPH   allopurinol  (ZYLOPRIM ) tablet 100 mg, 100 mg, Oral, Daily, Rogelia Jerilynn RAMAN, MD, 100 mg at 08/15/24 1001   aspirin  chewable tablet 81 mg, 81 mg, Oral, Daily, Vann, Jessica U, DO, 81 mg at 08/15/24 1007   atorvastatin  (LIPITOR ) tablet 40 mg, 40 mg, Oral, Daily, Jerri Pfeiffer, MD, 40 mg at 08/15/24 1007   buPROPion  (WELLBUTRIN  XL) 24 hr tablet 300 mg, 300 mg, Oral, Daily, Rogelia Jerilynn RAMAN, MD, 300 mg at 08/15/24 1001   cephALEXin  (KEFLEX ) capsule 250 mg, 250 mg, Oral, QHS, Vann, Jessica U, DO, 250 mg at 08/14/24 2139   clonazePAM  (KLONOPIN ) tablet 0.5 mg, 0.5 mg, Oral, TID PRN, Vann, Jessica U, DO, 0.5 mg at 08/14/24 2140   DULoxetine  (CYMBALTA ) DR capsule 60 mg, 60 mg, Oral, BID, Stanek, Lawrence S, MD, 60 mg at 08/15/24 1002   enoxaparin  (LOVENOX ) injection 40 mg, 40 mg, Subcutaneous, Q24H, Vann, Jessica U, DO, 40 mg at 08/14/24 1700   ezetimibe  (ZETIA ) tablet 10 mg, 10 mg, Oral, QHS, Vann, Jessica U, DO, 10 mg at 08/14/24 2140   finasteride (PROSCAR) tablet 5 mg, 5 mg, Oral, Daily, Rogelia Jerilynn RAMAN, MD, 5 mg at 08/15/24 1008   Gerhardt's butt cream, , Topical, Daily,  Vann, Jessica U, DO, Given at 08/15/24 1006   levothyroxine  (SYNTHROID ) tablet 125 mcg, 125 mcg, Oral, Q0600, Rogelia Jerilynn RAMAN, MD, 125 mcg at 08/15/24 9386   metoprolol  succinate (TOPROL -XL) 24 hr tablet 25 mg, 25 mg, Oral, Daily, Vann, Jessica U, DO, 25 mg at 08/14/24 1124   mirabegron  ER (MYRBETRIQ ) tablet 25 mg, 25 mg, Oral, Daily, Rogelia Jerilynn RAMAN, MD, 25 mg at 08/15/24 1008   pantoprazole  (PROTONIX ) EC tablet 40 mg, 40 mg, Oral, Daily, Rogelia Jerilynn RAMAN, MD, 40 mg at 08/15/24 1006   senna-docusate (Senokot-S) tablet 1 tablet, 1 tablet, Oral, QHS  PRN, Vann, Jessica U, DO   tamsulosin  (FLOMAX ) capsule 0.4 mg, 0.4 mg, Oral, Daily, Rogelia Jerilynn RAMAN, MD, 0.4 mg at 08/15/24 1000  Patients Current Diet:  Diet Order             Diet regular Room service appropriate? Yes; Fluid consistency: Thin  Diet effective now                   Precautions / Restrictions Precautions Precautions: Fall Restrictions Weight Bearing Restrictions Per Provider Order: No   Has the patient had 2 or more falls or a fall with injury in the past year? Yes  Prior Activity Level Community (5-7x/wk): gets out of house often  Prior Functional Level Self Care: Did the patient need help bathing, dressing, using the toilet or eating? Independent  Indoor Mobility: Did the patient need assistance with walking from room to room (with or without device)? Independent  Stairs: Did the patient need assistance with internal or external stairs (with or without device)? Unknown (tries to avoid stairs)  Functional Cognition: Did the patient need help planning regular tasks such as shopping or remembering to take medications? Needed some help  Patient Information Are you of Hispanic, Latino/a,or Spanish origin?: A. No, not of Hispanic, Latino/a, or Spanish origin What is your race?: A. White Do you need or want an interpreter to communicate with a doctor or health care staff?: 0. No  Patient's Response To:   Health Literacy and Transportation Is the patient able to respond to health literacy and transportation needs?: Yes Health Literacy - How often do you need to have someone help you when you read instructions, pamphlets, or other written material from your doctor or pharmacy?: Never In the past 12 months, has lack of transportation kept you from medical appointments or from getting medications?: No In the past 12 months, has lack of transportation kept you from meetings, work, or from getting things needed for daily living?: No  Journalist, Newspaper / Equipment Home Equipment: Shower seat, Grab bars - tub/shower, Grab bars - toilet, BSC/3in1, Agricultural Consultant (2 wheels), Rollator (4 wheels), Cane - single point, Wheelchair - manual  Prior Device Use: Indicate devices/aids used by the patient prior to current illness, exacerbation or injury? Manual wheelchair, Walker, and cane  Current Functional Level Cognition  Orientation Level: Oriented X4    Extremity Assessment (includes Sensation/Coordination)  Upper Extremity Assessment: Generalized weakness  Lower Extremity Assessment: Defer to PT evaluation RLE Deficits / Details: Hip flexion 3+/5, Knee ext 4+/5, Ankle DF 5/5 RLE Sensation: WNL RLE Coordination: decreased gross motor LLE Deficits / Details: Hip flexion 4/5, Knee ext 5/5, Ankle DF 5/5 LLE Sensation: WNL LLE Coordination: WNL    ADLs  Overall ADL's : Needs assistance/impaired Eating/Feeding: Set up Grooming: Wash/dry hands, Wash/dry face, Oral care, Set up, Sitting Grooming Details (indicate cue type and reason): anticipate inc A needed for standing grooming Upper Body Dressing : Contact guard assist, Bed level Upper Body Dressing Details (indicate cue type and reason): donned clean gown; anticipate inc A needed for sitting unsupported 2/2 reduced core strength (per pt report) Lower Body Dressing: Minimal assistance, Sitting/lateral leans Lower Body Dressing Details  (indicate cue type and reason): figure four method for donning RLE sock, attempted via hip hike but unsuccessful 2/2 reduced core strength/control Toilet Transfer: Moderate assistance, Ambulation, Regular Toilet, Rolling walker (2 wheels) Toileting- Clothing Manipulation and Hygiene: Maximal assistance Toileting - Clothing Manipulation Details (indicate cue type and reason): external male catheter  intact, anticipate inc A needed for other components of toileting Functional mobility during ADLs: Moderate assistance, +2 for physical assistance, Rolling walker (2 wheels) General ADL Comments: Patient seen in order to progress with functional activity tolerance and ADL management. Patient able to ambulate with mod A to sink, and complete grooming in standing. Patient continues to require cues for technique and upright posture prior to advancing with functional mobility. Patient completing sit<>stands from recliner, with mod A for balance provided. HR ranging from 97 to 120 BPM with mobility. OT recommendation remains appropriate; will continue to follow.    Mobility  Overal bed mobility: Needs Assistance Bed Mobility: Supine to Sit Supine to sit: Supervision Sit to supine: Contact guard assist General bed mobility comments: CGA for safety, cues for pacing as patient uses momentum to sit up, increasing safety risk    Transfers  Overall transfer level: Needs assistance Equipment used: Rolling walker (2 wheels), None Transfers: Sit to/from Stand Sit to Stand: Mod assist Bed to/from chair/wheelchair/BSC transfer type:: Via Lift equipment Step pivot transfers: Mod assist, +2 physical assistance, +2 safety/equipment Transfer via Lift Equipment: Stedy General transfer comment: Mod A for sit<>stand from EOB, able to ambulate with mod A to sink, completing static standing with RUE braced on sink, mod A to ambulate to recliner and completing 3 additional sit<>stands from recliner, cues for posture, hand  placement, feed placement throughout    Ambulation / Gait / Stairs / Wheelchair Mobility  Ambulation/Gait Ambulation/Gait assistance: Min assist, +2 safety/equipment Gait Distance (Feet): 66 Feet Assistive device: Rolling walker (2 wheels) Gait Pattern/deviations: Step-through pattern, Decreased stride length, Drifts right/left, Trunk flexed General Gait Details: Occasional VC for walker proximity today, as well as walker navigation during turns. +2 present for safety. Pt shaky with pre-gait, but no LOB noted throughout session. Gait velocity: decreased Gait velocity interpretation: <1.8 ft/sec, indicate of risk for recurrent falls Pre-gait activities: Weight shifts B x20 reps, +2 CGA. Standing marches B x15 reps, +2 CGA/minA.    Posture / Balance Dynamic Sitting Balance Sitting balance - Comments: intermittent CGA sitting unsupported EOB Balance Overall balance assessment: Needs assistance, History of Falls Sitting-balance support: No upper extremity supported, Feet supported Sitting balance-Leahy Scale: Good Sitting balance - Comments: intermittent CGA sitting unsupported EOB Standing balance support: Bilateral upper extremity supported, During functional activity, Reliant on assistive device for balance Standing balance-Leahy Scale: Poor Standing balance comment: reliant on UE and external support    Special considerations/life events  Skin Ecchymosis: leg/right, left and Bowel Incontinence   Previous Home Environment (from acute therapy documentation) Living Arrangements: Spouse/significant other  Lives With: Spouse Available Help at Discharge: Family, Available 24 hours/day Type of Home: House Home Layout: Two level Alternate Level Stairs-Rails: Right Alternate Level Stairs-Number of Steps: 3 (chair lift) Home Access: Stairs to enter Entrance Stairs-Rails: Right, Left Entrance Stairs-Number of Steps: 5 Bathroom Shower/Tub: Health Visitor: Handicapped  height Bathroom Accessibility: Yes How Accessible: Accessible via walker Home Care Services: No  Discharge Living Setting Plans for Discharge Living Setting: Patient's home Type of Home at Discharge: House Discharge Home Layout: Two level Alternate Level Stairs-Number of Steps: 3 and then uses chair lift Discharge Home Access: Stairs to enter Entrance Stairs-Rails: Right, Left Entrance Stairs-Number of Steps: 5 Discharge Bathroom Shower/Tub: Walk-in shower Discharge Bathroom Toilet: Handicapped height Discharge Bathroom Accessibility: Yes How Accessible: Accessible via walker Does the patient have any problems obtaining your medications?: No  Social/Family/Support Systems Anticipated Caregiver: [name], spouse Anticipated Industrial/product Designer Information:  [phone] Discharge Plan Discussed with Primary Caregiver: Yes Is Caregiver In Agreement with Plan?: Yes Does Caregiver/Family have Issues with Lodging/Transportation while Pt is in Rehab?: No  Goals Patient/Family Goal for Rehab: Supervision: PT/OT Expected length of stay: 7-10 days Pt/Family Agrees to Admission and willing to participate: Yes Program Orientation Provided & Reviewed with Pt/Caregiver Including Roles  & Responsibilities: Yes  Decrease burden of Care through IP rehab admission: NA  Possible need for SNF placement upon discharge: Not anticipated  Patient Condition: I have reviewed medical records from [facility], spoken with CM, and patient and spouse. I met with patient at the bedside for inpatient rehabilitation assessment.  Patient will benefit from ongoing PT and OT, can actively participate in 3 hours of therapy a day 5 days of the week, and can make measurable gains during the admission.  Patient will also benefit from the coordinated team approach during an Inpatient Acute Rehabilitation admission.  The patient will receive intensive therapy as well as Rehabilitation physician, nursing,  social worker, and care management interventions.  Due to bowel management, safety, skin/wound care, disease management, medication administration, pain management, and patient education the patient requires 24 hour a day rehabilitation nursing.  The patient is currently *** with mobility and basic ADLs.  Discharge setting and therapy post discharge at home with home health is anticipated.  Patient has agreed to participate in the Acute Inpatient Rehabilitation Program and will admit {Time; today/tomorrow:10263}.  Preadmission Screen Completed By:  Tinnie SHAUNNA Yvone Delayne, 08/15/2024 5:03 PM ______________________________________________________________________   Discussed status with Dr. PIERRETTE on *** at *** and received approval for admission today.  Admission Coordinator:  Tinnie SHAUNNA Yvone Delayne, CCC-SLP, time ***/Date ***   Assessment/Plan: Diagnosis: *** Does the need for close, 24 hr/day Medical supervision in concert with the patient's rehab needs make it unreasonable for this patient to be served in a less intensive setting? {yes_no_potentially:3041433} Co-Morbidities requiring supervision/potential complications: *** Due to {due un:6958565}, does the patient require 24 hr/day rehab nursing? {yes_no_potentially:3041433} Does the patient require coordinated care of a physician, rehab nurse, PT, OT, and SLP to address physical and functional deficits in the context of the above medical diagnosis(es)? {yes_no_potentially:3041433} Addressing deficits in the following areas: {deficits:3041436} Can the patient actively participate in an intensive therapy program of at least 3 hrs of therapy 5 days a week? {yes_no_potentially:3041433} The potential for patient to make measurable gains while on inpatient rehab is {potential:3041437} Anticipated functional outcomes upon discharge from inpatient rehab: {functional outcomes:304600100} PT, {functional outcomes:304600100} OT, {functional outcomes:304600100}  SLP Estimated rehab length of stay to reach the above functional goals is: *** Anticipated discharge destination: {anticipated dc setting:21604} 10. Overall Rehab/Functional Prognosis: {potential:3041437}   MD Signature: ***

## 2024-08-11 NOTE — Progress Notes (Signed)
 PROGRESS NOTE    [name]  FMW:[id number] DOB: [birth year] DOA: 08/08/2024 PCP: Garald Karlynn GAILS, MD    Brief Narrative:  [name] is a 78 y.o. male with medical history significant of history of relapsed large B-cell lymphoma, coronary artery disease, recurrent UTIs, atrial fibrillation (not on blood thinner due to bleeding/hematuria), chronic systolic heart failure.  Patient was in his normal health on 10/29 when he attended a heart failure clinic appointment.  After the doctor appointment he had acute right leg weakness-he describes as unable to stand as it gives out.  His strength in bed remains intact.  He also had some loss of sensation that has started to resolve.  Patient denies any back pain, hip pain or knee pain.  He was seen by teleneurology at [facility] and they recommended MRI of the brain, Plavix loading dose and then daily Plavix as well as aspirin .  They also recommended permissive hypertension for 24 to 48 hours   Family has noticed a difference in gait and strength over the last month.    Assessment and Plan: Right LE weakness - Family reports several falls in the last few days -CT head/neck done -MRI brain without acute issues -MRI lumbar with multiple chronic issues -NS to see--no plan for urgent/emergent surgery as issues are chronic, will need outpatient workup for cardiac clearance, EMG by neurology prior to surgery -PT/OT eval- CIR -- think he would be a good candidate prior to surgery -no SLP as no issues with speech or swallowing -Hgba1c less than 7 -FLP pending (on lipitor  80)-LDL 14 -neurology consulted and will see 10/31   Chronic AF - Rate controlled - Off AC due to bleeds and sees from chart review appears to be related to hematuria but also patient is a fall risk   CKD stage IIIA -baseline around 2   Hypothyroidism -on synthroid  -check TSH -check free t4   BPH -proscar/flomax    HTN -resume home meds -avoid hypotension    hx of relapsed large B-cell lymphoma   s/p R-CHOP 2019 - s/p RGDP 2023 - recently treated with Epcoritamab     Recurrent UTI -appears to be abx at bedtime for prophylaxis   Chronic systolic CHF -follows with HF clinic- Dr. KATHEE -05/2024 LVEF 25-30%  -GDT as able   OSA -does not tolerate CPAP   DVT prophylaxis: Place TED hose Start: 08/10/24 1734 enoxaparin  (LOVENOX ) injection 40 mg Start: 08/09/24 1800    Code Status: Full Code Family Communication: at bedside  Disposition Plan:  Level of care: Med-Surg Status is: Observation   CIR?   Consultants:  Neurology  Subjective: Agreeable to rehab  Objective: Vitals:   08/10/24 2351 08/11/24 0454 08/11/24 0834 08/11/24 1226  BP: (!) 127/90 131/85 131/84 112/65  Pulse: 77 82 84 72  Resp: 18 18 18 18   Temp: 98.1 F (36.7 C) 97.7 F (36.5 C) 98 F (36.7 C) 98.6 F (37 C)  TempSrc: Oral Oral  Oral  SpO2: 92% 94% 94% 95%  Weight:      Height:        Intake/Output Summary (Last 24 hours) at 08/11/2024 1419 Last data filed at 08/11/2024 0500 Gross per 24 hour  Intake --  Output 1100 ml  Net -1100 ml   Filed Weights   08/08/24 1854  Weight: 115.7 kg    Examination:   General: Appearance:    Obese male in no acute distress     Lungs:      respirations unlabored  Heart:    Normal heart rate.   MS:   All extremities are intact.    Neurologic:   Awake, alert, oriented x 3. No apparent focal neurological           defect.        Data Reviewed: I have personally reviewed following labs and imaging studies  CBC: Recent Labs  Lab 08/08/24 1936 08/11/24 0943  WBC 5.9 7.7  NEUTROABS 3.8  --   HGB 15.7 16.7  HCT 47.6 49.5  MCV 102.1* 101.0*  PLT 142* 138*   Basic Metabolic Panel: Recent Labs  Lab 08/08/24 1049 08/08/24 1936 08/11/24 0943  NA 138 137 137  K 4.9 4.8 4.1  CL 95* 99 99  CO2 28 29 26   GLUCOSE 75 89 102*  BUN 32* 31* 27*  CREATININE 2.00* 2.15* 1.98*  CALCIUM  9.2 9.8 8.9    GFR: Estimated Creatinine Clearance: 42.8 mL/min (A) (by C-G formula based on SCr of 1.98 mg/dL (H)). Liver Function Tests: Recent Labs  Lab 08/08/24 1936  AST 32  ALT 24  ALKPHOS 74  BILITOT 0.7  PROT 6.0*  ALBUMIN  3.8   No results for input(s): LIPASE, AMYLASE in the last 168 hours. No results for input(s): AMMONIA in the last 168 hours. Coagulation Profile: Recent Labs  Lab 08/08/24 1936  INR 1.0   Cardiac Enzymes: Recent Labs  Lab 08/11/24 0513  CKTOTAL 58   BNP (last 3 results) No results for input(s): PROBNP in the last 8760 hours. HbA1C: Recent Labs    08/10/24 0139  HGBA1C 5.6   CBG: Recent Labs  Lab 08/08/24 1854 08/10/24 0636  GLUCAP 97 110*   Lipid Profile: Recent Labs    08/10/24 0139  CHOL 70  HDL 48  LDLCALC 14  TRIG 42  CHOLHDL 1.5   Thyroid  Function Tests: Recent Labs    08/11/24 0513  TSH 31.742*   Anemia Panel: Recent Labs    08/10/24 0139  VITAMINB12 907   Sepsis Labs: No results for input(s): PROCALCITON, LATICACIDVEN in the last 168 hours.  No results found for this or any previous visit (from the past 240 hours).       Radiology Studies: MR Lumbar Spine W Wo Contrast Result Date: 08/10/2024 CLINICAL DATA:  Initial evaluation for lumbar radiculopathy. EXAM: MRI LUMBAR SPINE WITHOUT AND WITH CONTRAST TECHNIQUE: Multiplanar and multiecho pulse sequences of the lumbar spine were obtained without and with intravenous contrast. CONTRAST:  10mL GADAVIST  GADOBUTROL  1 MMOL/ML IV SOLN COMPARISON:  Prior CT from 08/13/2023 and MRI from 02/08/2013. FINDINGS: Segmentation: Standard segmentation. Lowest well-formed disc space labeled the L5-S1 level. Alignment: Severe chronic compression deformity of L1 with 8 mm bony retropulsion and focal kyphotic angulation at the thoracolumbar junction. No other listhesis. Vertebrae: Severe chronic L1 compression fracture with vertebral plana formation centrally and 8 mm bony  retropulsion. No residual marrow edema or enhancement. Vertebral body height otherwise relatively well maintained with no other acute or chronic fracture. Minimal concavity at the superior endplate of L5 favored to be degenerative in nature. Underlying bone marrow signal intensity heterogeneous but with no worrisome osseous lesions. Minimal reactive marrow edema and enhancement present about the L4-5 interspace on the left, degenerative in nature. No other abnormal marrow edema or enhancement. Conus medullaris and cauda equina: Conus extends to the L1 level. Focal kinking of the distal conus related to the chronic L1 fracture without cord signal changes (series 3, image 9). Nerve roots of the cauda equina  within normal limits. No abnormal enhancement. Paraspinal and other soft tissues: Paraspinous soft tissues demonstrate no acute finding. Asymmetric right renal atrophy with hydroureteronephrosis, similar as compared to prior CT, and possibly chronic in nature. Chronic atrophy involving the right psoas muscle related to a right total hip arthroplasty noted. Disc levels: T12-L1 8 mm bony retropulsion related to the chronic L1 compression fracture. Retropulsed bone flattens and indents the ventral thecal sac. Mild bilateral facet hypertrophy. Minimal flattening and displacement of the distal conus without cord signal changes. Moderate spinal stenosis. Foramina remain patent. L1-2: Degenerative intervertebral disc space narrowing with disc bulge and disc desiccation. 8 mm retropulsed bone indents the ventral thecal sac. Mild facet and ligament flavum hypertrophy. Up to moderate spinal stenosis at the level of L1 due to bony retropulsion. Moderate bilateral L1 foraminal stenosis. L2-3: Disc desiccation with mild disc bulge, slightly asymmetric to the left. Mild facet and ligament flavum hypertrophy. Associated trace joint effusions. No significant spinal stenosis. Moderate left L2 foraminal narrowing. Right neural  foramina remains patent. L3-4: Disc desiccation with diffuse disc bulge, slightly asymmetric to the left. Mild to moderate facet and ligament flavum hypertrophy. Mild prominence of the dorsal epidural fat. Resultant moderate spinal stenosis. Mild right with mild to moderate left L3 foraminal narrowing. L4-5: Disc desiccation with mild disc bulge. Moderate facet and ligament flavum hypertrophy. Prominence of the dorsal epidural fat. Resultant moderate spinal stenosis. Moderate bilateral L4 foraminal narrowing, slightly greater on the left. L5-S1: Disc desiccation with mild disc bulge. Bulging disc mildly indents the ventral thecal sac, closely approximating the descending S1 nerve roots without overt neural impingement or displacement. Mild bilateral facet arthrosis. 1 cm complex rim enhancing lesion posterior to the left L5-S1 facet likely reflects a synovial cyst (series 3, image 12). This is not in a position to cause neural impingement. No significant spinal stenosis. Foramina remain patent. IMPRESSION: 1. Severe chronic compression deformity of L1 with 8 mm bony retropulsion and focal kyphotic angulation at the thoracolumbar junction. Resultant moderate spinal stenosis at this level with mild flattening and displacement of the distal conus, but no cord signal changes. 2. Multifactorial degenerative changes at L3-4 and L4-5 with resultant moderate spinal stenosis, with mild to moderate bilateral L3 and L4 foraminal narrowing as above. 3. Moderate left L2 foraminal stenosis related to disc bulge and facet hypertrophy. 4. Atrophy of the right psoas muscle in the setting of underlying right total hip arthroplasty. Findings could contribute to right lower extremity weakness. 5. Asymmetric right renal atrophy with hydroureteronephrosis, similar as compared to prior CT, and possibly chronic in nature. Correlation with cross-sectional imaging of the abdomen and pelvis recommended as warranted. Electronically Signed    By: Morene Hoard M.D.   On: 08/10/2024 19:33   ECHOCARDIOGRAM COMPLETE Result Date: 08/10/2024    ECHOCARDIOGRAM REPORT   Patient Name:   MISTER KRAHENBUHL Date of Exam: 08/10/2024 Medical Rec #:  [id number]        Height:       76.0 in Accession #:    [id number]       Weight:       255.0 lb Date of Birth:  1946/04/08        BSA:          2.456 m Patient Age:    77 years         BP:           115/75 mmHg Patient Gender: M  HR:           95 bpm. Exam Location:  Inpatient Procedure: 2D Echo, Cardiac Doppler, Color Doppler and Intracardiac            Opacification Agent (Both Spectral and Color Flow Doppler were            utilized during procedure). Indications:    Stroke  History:        Patient has prior history of Echocardiogram examinations, most                 recent 05/23/2024. CAD, Abnormal ECG, TIA and Stroke,                 Arrythmias:Atrial Fibrillation; Risk Factors:Hypertension,                 Dyslipidemia and Sleep Apnea. Lyphoma.  Sonographer:    Ellouise Mose RDCS Referring Phys: [id number] Keela Rubert [facility]  Sonographer Comments: Technically difficult study due to poor echo windows. Image acquisition challenging due to respiratory motion. Study delayed 15 mins to get patient in bed. Two assist with steady. Extremely difficult study. Patient attempted to turn. IMPRESSIONS  1. Left ventricular ejection fraction, by estimation, is 35 to 40%. The left ventricle has moderately decreased function. The left ventricle demonstrates global hypokinesis. The left ventricular internal cavity size was mildly dilated. Diastology indeterminate due to atrial fibrillation.  2. Right ventricular systolic function is mildly reduced. The right ventricular size is normal. Tricuspid regurgitation signal is inadequate for assessing PA pressure.  3. The mitral valve is normal in structure. Trivial mitral valve regurgitation. No evidence of mitral stenosis.  4. The aortic valve was not well visualized. Aortic valve  regurgitation is not visualized. No aortic stenosis is present.  5. The inferior vena cava is normal in size with greater than 50% respiratory variability, suggesting right atrial pressure of 3 mmHg. Comparison(s): Improvement in LV systolic function. Conclusion(s)/Recommendation(s): Improvement in LVEF to 35-40%. Very limited valvular assessment due to poor acoustic windows. Within this limitation, no notable valvular disease. FINDINGS  Left Ventricle: Left ventricular ejection fraction, by estimation, is 35 to 40%. The left ventricle has moderately decreased function. The left ventricle demonstrates global hypokinesis. Definity  contrast agent was given IV to delineate the left ventricular endocardial borders. The left ventricular internal cavity size was mildly dilated. There is no left ventricular hypertrophy. Diastology indeterminate due to atrial fibrillation. Right Ventricle: The right ventricular size is normal. No increase in right ventricular wall thickness. Right ventricular systolic function is mildly reduced. Tricuspid regurgitation signal is inadequate for assessing PA pressure. Left Atrium: Left atrial size was normal in size. Right Atrium: Right atrial size was normal in size. Pericardium: There is no evidence of pericardial effusion. Mitral Valve: The mitral valve is normal in structure. Trivial mitral valve regurgitation. No evidence of mitral valve stenosis. Tricuspid Valve: The tricuspid valve is normal in structure. Tricuspid valve regurgitation is not demonstrated. No evidence of tricuspid stenosis. Aortic Valve: The aortic valve was not well visualized. Aortic valve regurgitation is not visualized. No aortic stenosis is present. Pulmonic Valve: The pulmonic valve was normal in structure. Pulmonic valve regurgitation is not visualized. No evidence of pulmonic stenosis. Aorta: The aortic root was not well visualized and the ascending aorta was not well visualized. Venous: The inferior vena cava  is normal in size with greater than 50% respiratory variability, suggesting right atrial pressure of 3 mmHg. IAS/Shunts: No atrial level shunt detected by color flow Doppler.  LEFT VENTRICLE PLAX 2D LVIDd:         5.40 cm LVIDs:         4.80 cm LV PW:         1.30 cm LV IVS:        1.30 cm LVOT diam:     2.20 cm LV SV:         49 LV SV Index:   20 LVOT Area:     3.80 cm  LV Volumes (MOD) LV vol d, MOD A2C: 94.6 ml LV vol d, MOD A4C: 125.0 ml LV vol s, MOD A2C: 66.7 ml LV vol s, MOD A4C: 88.5 ml LV SV MOD A2C:     27.9 ml LV SV MOD A4C:     125.0 ml LV SV MOD BP:      28.0 ml RIGHT VENTRICLE            IVC RV S prime:     7.40 cm/s  IVC diam: 2.10 cm TAPSE (M-mode): 1.0 cm LEFT ATRIUM             Index        RIGHT ATRIUM           Index LA diam:        4.70 cm 1.91 cm/m   RA Area:     22.60 cm LA Vol (A2C):   56.9 ml 23.17 ml/m  RA Volume:   63.90 ml  26.02 ml/m LA Vol (A4C):   38.8 ml 15.80 ml/m LA Biplane Vol: 45.3 ml 18.44 ml/m  AORTIC VALVE LVOT Vmax:   89.50 cm/s LVOT Vmean:  59.300 cm/s LVOT VTI:    0.129 m  AORTA Ao Root diam: 3.90 cm MITRAL VALVE MV Area (PHT): 4.21 cm    SHUNTS MV Decel Time: 180 msec    Systemic VTI:  0.13 m MV E velocity: 78.10 cm/s  Systemic Diam: 2.20 cm Georganna Archer Electronically signed by Georganna Archer Signature Date/Time: 08/10/2024/2:57:06 PM    Final    MR BRAIN WO CONTRAST Result Date: 08/10/2024 CLINICAL DATA:  Initial evaluation for acute neuro deficit, stroke suspected. EXAM: MRI HEAD WITHOUT CONTRAST TECHNIQUE: Multiplanar, multiecho pulse sequences of the brain and surrounding structures were obtained without intravenous contrast. COMPARISON:  Comparison made with CT from 08/08/2024. FINDINGS: Brain: Examination mildly degraded by motion artifact. Diffuse prominence of the CSF containing spaces compatible generalized cerebral atrophy. Patchy and confluent T2/FLAIR hyperintensity involving the periventricular, deep, and subcortical white matter both cerebral  hemispheres, nonspecific, but most commonly related to chronic microvascular ischemic disease. Changes are mild for age. Small focus of encephalomalacia and gliosis involving the anterior left frontal lobe (series 6, image 22). Finding favored to be related to prior trauma, although could potentially be related to a remote infarct. No abnormal foci of restricted diffusion to suggest acute or subacute ischemia. Gray-white matter differentiation maintained. No acute intracranial hemorrhage. Single chronic microhemorrhage noted within the inferior right cerebellum. No mass lesion, midline shift or mass effect. No hydrocephalus or extra-axial fluid collection. Pituitary gland within normal limits. Vascular: Major intracranial vascular flow voids are maintained. Skull and upper cervical spine: Craniocervical junction within normal limits. Bone marrow signal intensity overall within normal limits. No scalp soft tissue abnormality. Sinuses/Orbits: Prior bilateral ocular lens replacement. Mild mucosal thickening present about the ethmoidal air cells. Paranasal sinuses are otherwise clear. No significant mastoid effusion. Other: Suspected postoperative changes at the right nasopharynx noted. IMPRESSION: 1. No acute intracranial abnormality. 2.  Small focus of encephalomalacia and gliosis involving the anterior left frontal lobe, favored to be related to prior trauma, although could potentially be related to a remote infarct. 3. Underlying age-related cerebral atrophy with mild chronic small vessel ischemic disease. Electronically Signed   By: Morene Hoard M.D.   On: 08/10/2024 00:46        Scheduled Meds:  acyclovir   400 mg Oral BID   allopurinol   100 mg Oral Daily   aspirin   81 mg Oral Daily   atorvastatin   40 mg Oral Daily   buPROPion   300 mg Oral Daily   cephALEXin   250 mg Oral QHS   DULoxetine   60 mg Oral BID   enoxaparin  (LOVENOX ) injection  40 mg Subcutaneous Q24H   ezetimibe   10 mg Oral QHS    finasteride  5 mg Oral Daily   levothyroxine   125 mcg Oral Q0600   metoprolol  succinate  25 mg Oral Daily   mirabegron  ER  25 mg Oral Daily   pantoprazole   40 mg Oral Daily   tamsulosin   0.4 mg Oral Daily   Continuous Infusions:     LOS: 0 days    Time spent: 45 minutes spent on chart review, discussion with nursing staff, consultants, updating family and interview/physical exam; more than 50% of that time was spent in counseling and/or coordination of care.    Harlene RAYMOND Bowl, DO [facility] Available via Epic secure chat 7am-7pm After these hours, please refer to coverage provider listed on [email or web address] 08/11/2024, 2:19 PM

## 2024-08-11 NOTE — Plan of Care (Signed)
   Problem: Education: Goal: Knowledge of General Education information will improve Description Including pain rating scale, medication(s)/side effects and non-pharmacologic comfort measures Outcome: Progressing   Problem: Health Behavior/Discharge Planning: Goal: Ability to manage health-related needs will improve Outcome: Progressing

## 2024-08-11 NOTE — Progress Notes (Addendum)
 STROKE TEAM PROGRESS NOTE    SIGNIFICANT HOSPITAL EVENTS 10/29 - 78 y.o. male who presented with c/o R leg weakness and numbness. MRI and CT without acute abnormalities.   INTERIM HISTORY/SUBJECTIVE: No family at the bedside.  Patient is sitting up in bed eating his breakfast.  No new neurological events overnight MRI lumbar spine withSevere chronic compression deformity of L1 with 8 mm bony retropulsion and focal kyphotic angulation at the thoracolumbar junction. Resultant moderate spinal stenosis at this level with mild flattening and displacement of the distal conus, but no cord signal changes.  Recommend neurosurgery consult  Neurology will sign off please call with questions or concerns CBC    Component Value Date/Time   WBC 7.7 08/11/2024 0943   RBC 4.90 08/11/2024 0943   HGB 16.7 08/11/2024 0943   HGB 17.0 06/19/2024 1403   HCT 49.5 08/11/2024 0943   HCT 49.8 06/19/2024 1403   PLT 138 (L) 08/11/2024 0943   PLT 154 06/19/2024 1403   MCV 101.0 (H) 08/11/2024 0943   MCV 102 (H) 06/19/2024 1403   MCH 34.1 (H) 08/11/2024 0943   MCHC 33.7 08/11/2024 0943   RDW 15.7 (H) 08/11/2024 0943   RDW 13.6 06/19/2024 1403   LYMPHSABS 1.3 08/08/2024 1936   LYMPHSABS 1.5 08/09/2019 1100   MONOABS 0.5 08/08/2024 1936   EOSABS 0.1 08/08/2024 1936   EOSABS 0.1 08/09/2019 1100   BASOSABS 0.0 08/08/2024 1936   BASOSABS 0.0 08/09/2019 1100    BMET    Component Value Date/Time   NA 137 08/11/2024 0943   NA 138 06/19/2024 1403   K 4.1 08/11/2024 0943   CL 99 08/11/2024 0943   CO2 26 08/11/2024 0943   GLUCOSE 102 (H) 08/11/2024 0943   BUN 27 (H) 08/11/2024 0943   BUN 36 (H) 06/19/2024 1403   CREATININE 1.98 (H) 08/11/2024 0943   CREATININE 1.21 05/31/2022 1321   CREATININE 1.30 (H) 05/24/2022 1436   CALCIUM  8.9 08/11/2024 0943   EGFR 36 (L) 06/19/2024 1403   GFRNONAA 34 (L) 08/11/2024 0943   GFRNONAA >60 05/31/2022 1321    IMAGING past 24 hours MR Lumbar Spine W Wo  Contrast Result Date: 08/10/2024 CLINICAL DATA:  Initial evaluation for lumbar radiculopathy. EXAM: MRI LUMBAR SPINE WITHOUT AND WITH CONTRAST TECHNIQUE: Multiplanar and multiecho pulse sequences of the lumbar spine were obtained without and with intravenous contrast. CONTRAST:  10mL GADAVIST  GADOBUTROL  1 MMOL/ML IV SOLN COMPARISON:  Prior CT from 08/13/2023 and MRI from 02/08/2013. FINDINGS: Segmentation: Standard segmentation. Lowest well-formed disc space labeled the L5-S1 level. Alignment: Severe chronic compression deformity of L1 with 8 mm bony retropulsion and focal kyphotic angulation at the thoracolumbar junction. No other listhesis. Vertebrae: Severe chronic L1 compression fracture with vertebral plana formation centrally and 8 mm bony retropulsion. No residual marrow edema or enhancement. Vertebral body height otherwise relatively well maintained with no other acute or chronic fracture. Minimal concavity at the superior endplate of L5 favored to be degenerative in nature. Underlying bone marrow signal intensity heterogeneous but with no worrisome osseous lesions. Minimal reactive marrow edema and enhancement present about the L4-5 interspace on the left, degenerative in nature. No other abnormal marrow edema or enhancement. Conus medullaris and cauda equina: Conus extends to the L1 level. Focal kinking of the distal conus related to the chronic L1 fracture without cord signal changes (series 3, image 9). Nerve roots of the cauda equina within normal limits. No abnormal enhancement. Paraspinal and other soft tissues: Paraspinous soft tissues demonstrate  no acute finding. Asymmetric right renal atrophy with hydroureteronephrosis, similar as compared to prior CT, and possibly chronic in nature. Chronic atrophy involving the right psoas muscle related to a right total hip arthroplasty noted. Disc levels: T12-L1 8 mm bony retropulsion related to the chronic L1 compression fracture. Retropulsed bone flattens  and indents the ventral thecal sac. Mild bilateral facet hypertrophy. Minimal flattening and displacement of the distal conus without cord signal changes. Moderate spinal stenosis. Foramina remain patent. L1-2: Degenerative intervertebral disc space narrowing with disc bulge and disc desiccation. 8 mm retropulsed bone indents the ventral thecal sac. Mild facet and ligament flavum hypertrophy. Up to moderate spinal stenosis at the level of L1 due to bony retropulsion. Moderate bilateral L1 foraminal stenosis. L2-3: Disc desiccation with mild disc bulge, slightly asymmetric to the left. Mild facet and ligament flavum hypertrophy. Associated trace joint effusions. No significant spinal stenosis. Moderate left L2 foraminal narrowing. Right neural foramina remains patent. L3-4: Disc desiccation with diffuse disc bulge, slightly asymmetric to the left. Mild to moderate facet and ligament flavum hypertrophy. Mild prominence of the dorsal epidural fat. Resultant moderate spinal stenosis. Mild right with mild to moderate left L3 foraminal narrowing. L4-5: Disc desiccation with mild disc bulge. Moderate facet and ligament flavum hypertrophy. Prominence of the dorsal epidural fat. Resultant moderate spinal stenosis. Moderate bilateral L4 foraminal narrowing, slightly greater on the left. L5-S1: Disc desiccation with mild disc bulge. Bulging disc mildly indents the ventral thecal sac, closely approximating the descending S1 nerve roots without overt neural impingement or displacement. Mild bilateral facet arthrosis. 1 cm complex rim enhancing lesion posterior to the left L5-S1 facet likely reflects a synovial cyst (series 3, image 12). This is not in a position to cause neural impingement. No significant spinal stenosis. Foramina remain patent. IMPRESSION: 1. Severe chronic compression deformity of L1 with 8 mm bony retropulsion and focal kyphotic angulation at the thoracolumbar junction. Resultant moderate spinal stenosis at  this level with mild flattening and displacement of the distal conus, but no cord signal changes. 2. Multifactorial degenerative changes at L3-4 and L4-5 with resultant moderate spinal stenosis, with mild to moderate bilateral L3 and L4 foraminal narrowing as above. 3. Moderate left L2 foraminal stenosis related to disc bulge and facet hypertrophy. 4. Atrophy of the right psoas muscle in the setting of underlying right total hip arthroplasty. Findings could contribute to right lower extremity weakness. 5. Asymmetric right renal atrophy with hydroureteronephrosis, similar as compared to prior CT, and possibly chronic in nature. Correlation with cross-sectional imaging of the abdomen and pelvis recommended as warranted. Electronically Signed   By: Morene Hoard M.D.   On: 08/10/2024 19:33    Vitals:   08/10/24 2044 08/10/24 2351 08/11/24 0454 08/11/24 0834  BP: 106/66 (!) 127/90 131/85 131/84  Pulse: 80 77 82 84  Resp: 18 18 18 18   Temp: 97.6 F (36.4 C) 98.1 F (36.7 C) 97.7 F (36.5 C) 98 F (36.7 C)  TempSrc: Oral Oral Oral   SpO2: 93% 92% 94% 94%  Weight:      Height:         PHYSICAL EXAM General:  Alert, well-nourished, well-developed patient in no acute distress Psych:  Mood and affect appropriate for situation CV: Regular rate and rhythm on monitor Respiratory:  Regular, unlabored respirations on room air   NEURO:  Mental Status: AA&Ox3, patient is able to give clear and coherent history Speech/Language: speech is without dysarthria or aphasia.  Naming, repetition, fluency, and comprehension intact.  Cranial Nerves:  II: PERRL. Visual fields full.  III, IV, VI: EOMI. Eyelids elevate symmetrically.  V: Sensation is intact to light touch and symmetrical to face.  VII: Face is symmetrical resting and smiling VIII: hearing intact to voice. IX, X: Palate elevates symmetrically. Phonation is normal.  KP:Dynloizm shrug 5/5. XII: tongue is midline without  fasciculations. Motor: 4/5 proximal hip flexion and ankle dorsiflexion strength of RLE. 5/5 strength to all other muscle groups tested.  Tone: is normal and bulk is normal Sensation- Intact to light touch bilaterally in extremities except for bilateral loss of light touch, proprioception, joint sensation in feet distally. Extinction absent to light touch to DSS. Coordination: FTN intact bilaterally, HKS: no ataxia in BLE. No drift.  Gait- deferred   Reflexes: globally decreased 1+   Most Recent NIH 0     ASSESSMENT/PLAN:   [name] is a 78 y.o. male with history of relapsed large B-cell lymphoma, CAD, recurrent UTI, atrial fibrillation, OSA, hypercholesterolemia, GAD, CKD3, osteoporosis and systolic HF admitted for right leg weakness and numbness.  NIH on Admission 0.   Gradually worsening R leg weakness with recent falls, etiology unclear, radiculopathy vs. chemo-induced neuropathy vs. myositis Per pt husband, pt started to have RLE weakness 4 weeks ago, causing wobbly gait, gradually getting worse. For the last 2 days it was getting even worse, weak when walking with walker and fell 3 times, which prompted him for ER visit. Code Stroke CT head: No acute abnormality.  CTA head & neck: Negative CTA for LVO or other emergent finding. Left carotid bulb atherosclerotic plaque with approximately 50% stenosis. Atheromatous change at the vertebral artery origins with severe stenoses. Carotid siphon atherosclerosis with mild to moderate narrowing bilaterally,worse on the right MRI: No acute intracranial abnormality. Small focus of encephalomalacia and gliosis involving the anterior left frontal lobe, favored to be related to prior trauma, although could potentially be related to a remote infarct. MRI lumbar spine with and without contrast 1. Severe chronic compression deformity of L1 with 8 mm bony retropulsion and focal kyphotic angulation at the thoracolumbar junction. Resultant moderate  spinal stenosis at this level with mild flattening and displacement of the distal conus, but no cord signal changes. 2. Multifactorial degenerative changes at L3-4 and L4-5 with resultant moderate spinal stenosis, with mild to moderate bilateral L3 and L4 foraminal narrowing as above. 3. Moderate left L2 foraminal stenosis related to disc bulge and facet hypertrophy. 4. Atrophy of the right psoas muscle in the setting of underlying right total hip arthroplasty. Findings could contribute to right lower extremity weakness. 5. Asymmetric right renal atrophy with hydroureteronephrosis, similar as compared to prior CT, and possibly chronic in nature. Correlation with cross-sectional imaging of the abdomen and pelvis recommended as warranted. 2D Echo EF 35-40% LDL 14 HgbA1c 5.6 UDS neg Pending  aldolase, CK 58, ESR 2 , CRP 0.6 and TSH 31.742.  B12 level normal. VTE prophylaxis - Lovenox  No antithrombotic prior to admission, now on aspirin  81 mg daily Recommend outpatient EMG for further evaluation of weakness Therapy recommendations:  CIR Disposition: Pending   Atrial fibrillation not on AC Initially diagnosed around 2001 Off anticoagulation due to hematuria and multiple falls, and also on CAR-T therapy.  Will defer [facility] management decision to outpatient cardiology given finishing CAR-T now and no recurrent hematuria. Rate-controlled Continue telemetry monitoring   Recurrent Diffuse Large B-Cell Lymphoma S/p R-CHOP 2019 S/p RGDP 2023 Recently treated with Epcoritamab   Now finishing CAR-T   Hypertension  orthostatic hypotension Home meds: Losartan , Toprol  XL Stable now Orthostatic vitals lying 127/80, sitting 115/69, standing 103/59 Per PT, pt has no complains during PT session Put on TED hose BP goal of normotension   Hyperlipidemia Home meds: Atorvastatin  80 mg + Zetia  10 mg LDL 14, goal < 70 Decrease lipitor  to 40 given low LDL and continue zetia  Continue statin and zetia  at  discharge   Other Stroke Risk Factors Obesity, Body mass index is 31.04 kg/m., BMI >/= 30 associated with increased stroke risk, recommend weight loss, diet and exercise as appropriate  Coronary artery disease Congestive heart failure with EF 35-40% Obstructive sleep apnea, not on CPAP, considering Inspire device Former tobacco use   Other Active Problems CKD 3b, Cre 2.15 Anxiety   Hospital day # 0   Karna Geralds DNP, ACNPC-AG  [facility]  I have personally obtained history,examined this patient, reviewed notes, independently viewed imaging studies, participated in medical decision making and plan of care.ROS completed by me personally and pertinent positives fully documented  I have made any additions or clarifications directly to the above note. Agree with note above.  Patient presented with subacute right leg weakness and neuroimaging negative for stroke but shows significant compression deformity at L1 with bony retropulsion and kyphotic angulation with moderate spinal stenosis and mild flattening of the conus which is likely symptomatic.  Recommend neurosurgical referral.  No further stroke related workup is necessary.  Long discussion with patient and Dr. Juvenal and answered questions.  Stroke team will sign off.  Kindly call for questions.   I personally spent a total of  35 minutes in the care of the patient today including getting/reviewing separately obtained history, performing a medically appropriate exam/evaluation, counseling and educating, placing orders, referring and communicating with other health care professionals, documenting clinical information in the EHR, independently interpreting results, and coordinating care.        Eather Popp, MD Medical Director [facility] Pager: [phone] 08/11/2024 3:45 PM    To contact Stroke Continuity provider, please refer to [email or web address]. After hours, contact General Neurology

## 2024-08-11 NOTE — Progress Notes (Signed)
 Inpatient Rehab Admissions:  Inpatient Rehab Consult received.  I met with patient and husband [name] at the bedside for rehabilitation assessment and to discuss goals and expectations of an inpatient rehab admission.  Discussed average length of stay, insurance authorization requirement and discharge home after completion of CIR. Both acknowledged understanding. Pt and [name] have not decided which type of rehab pt would prefer after discharge. They are going to discuss rehab options prior to making a decision regarding CIR. Will continue to follow.  Signed: Tinnie Yvone Cohens, MS, CCC-SLP Admissions Coordinator [phone]

## 2024-08-12 DIAGNOSIS — E890 Postprocedural hypothyroidism: Secondary | ICD-10-CM | POA: Diagnosis present

## 2024-08-12 DIAGNOSIS — Z79899 Other long term (current) drug therapy: Secondary | ICD-10-CM | POA: Diagnosis not present

## 2024-08-12 DIAGNOSIS — Z79890 Hormone replacement therapy (postmenopausal): Secondary | ICD-10-CM | POA: Diagnosis not present

## 2024-08-12 DIAGNOSIS — M4856XA Collapsed vertebra, not elsewhere classified, lumbar region, initial encounter for fracture: Secondary | ICD-10-CM | POA: Diagnosis present

## 2024-08-12 DIAGNOSIS — N390 Urinary tract infection, site not specified: Secondary | ICD-10-CM | POA: Diagnosis present

## 2024-08-12 DIAGNOSIS — I4891 Unspecified atrial fibrillation: Secondary | ICD-10-CM | POA: Diagnosis not present

## 2024-08-12 DIAGNOSIS — E669 Obesity, unspecified: Secondary | ICD-10-CM | POA: Diagnosis present

## 2024-08-12 DIAGNOSIS — E7800 Pure hypercholesterolemia, unspecified: Secondary | ICD-10-CM | POA: Diagnosis present

## 2024-08-12 DIAGNOSIS — G4733 Obstructive sleep apnea (adult) (pediatric): Secondary | ICD-10-CM | POA: Diagnosis present

## 2024-08-12 DIAGNOSIS — I951 Orthostatic hypotension: Secondary | ICD-10-CM | POA: Diagnosis present

## 2024-08-12 DIAGNOSIS — R296 Repeated falls: Secondary | ICD-10-CM | POA: Diagnosis present

## 2024-08-12 DIAGNOSIS — I130 Hypertensive heart and chronic kidney disease with heart failure and stage 1 through stage 4 chronic kidney disease, or unspecified chronic kidney disease: Secondary | ICD-10-CM | POA: Diagnosis present

## 2024-08-12 DIAGNOSIS — C8330 Diffuse large B-cell lymphoma, unspecified site: Secondary | ICD-10-CM | POA: Diagnosis present

## 2024-08-12 DIAGNOSIS — Z01818 Encounter for other preprocedural examination: Secondary | ICD-10-CM | POA: Diagnosis not present

## 2024-08-12 DIAGNOSIS — R29898 Other symptoms and signs involving the musculoskeletal system: Secondary | ICD-10-CM | POA: Diagnosis not present

## 2024-08-12 DIAGNOSIS — D631 Anemia in chronic kidney disease: Secondary | ICD-10-CM | POA: Diagnosis present

## 2024-08-12 DIAGNOSIS — N1832 Chronic kidney disease, stage 3b: Secondary | ICD-10-CM | POA: Diagnosis present

## 2024-08-12 DIAGNOSIS — I5022 Chronic systolic (congestive) heart failure: Secondary | ICD-10-CM | POA: Diagnosis present

## 2024-08-12 DIAGNOSIS — G459 Transient cerebral ischemic attack, unspecified: Secondary | ICD-10-CM | POA: Diagnosis present

## 2024-08-12 DIAGNOSIS — G9389 Other specified disorders of brain: Secondary | ICD-10-CM | POA: Diagnosis present

## 2024-08-12 DIAGNOSIS — M48061 Spinal stenosis, lumbar region without neurogenic claudication: Secondary | ICD-10-CM | POA: Diagnosis present

## 2024-08-12 DIAGNOSIS — I272 Pulmonary hypertension, unspecified: Secondary | ICD-10-CM | POA: Diagnosis present

## 2024-08-12 DIAGNOSIS — Z7982 Long term (current) use of aspirin: Secondary | ICD-10-CM | POA: Diagnosis not present

## 2024-08-12 DIAGNOSIS — I2510 Atherosclerotic heart disease of native coronary artery without angina pectoris: Secondary | ICD-10-CM | POA: Diagnosis not present

## 2024-08-12 DIAGNOSIS — E785 Hyperlipidemia, unspecified: Secondary | ICD-10-CM | POA: Diagnosis not present

## 2024-08-12 DIAGNOSIS — Z87891 Personal history of nicotine dependence: Secondary | ICD-10-CM | POA: Diagnosis not present

## 2024-08-12 DIAGNOSIS — Z8249 Family history of ischemic heart disease and other diseases of the circulatory system: Secondary | ICD-10-CM | POA: Diagnosis not present

## 2024-08-12 DIAGNOSIS — I4819 Other persistent atrial fibrillation: Secondary | ICD-10-CM | POA: Diagnosis present

## 2024-08-12 LAB — T4, FREE: Free T4: 0.95 ng/dL (ref 0.61–1.12)

## 2024-08-12 LAB — ALDOLASE: Aldolase: 6.2 U/L (ref 3.3–10.3)

## 2024-08-12 NOTE — Progress Notes (Signed)
 PROGRESS NOTE    [name]  FMW:[id number] DOB: May 24, 1946 DOA: 08/08/2024 PCP: Garald Karlynn GAILS, MD    Brief Narrative:  [name] is a 78 y.o. male with medical history significant of history of relapsed large B-cell lymphoma, coronary artery disease, recurrent UTIs, atrial fibrillation (not on blood thinner due to bleeding/hematuria), chronic systolic heart failure.  Patient was in his normal health on 10/29 when he attended a heart failure clinic appointment.  After the doctor appointment he had acute right leg weakness-he describes as unable to stand as it gives out.  His strength in bed remains intact.  He also had some loss of sensation that has started to resolve.  Patient denies any back pain, hip pain or knee pain.  He was seen by teleneurology at [facility] and they recommended MRI of the brain, Plavix loading dose and then daily Plavix as well as aspirin .  They also recommended permissive hypertension for 24 to 48 hours   Family has noticed a difference in gait and strength over the last month.    Assessment and Plan: Right LE weakness - Family reports several falls in the last few days -CT head/neck done -MRI brain without acute issues -MRI lumbar with multiple chronic issues -NS to see--no plan for urgent/emergent surgery as issues are chronic, will need outpatient workup for cardiac clearance, EMG by neurology prior to discussion on surgery -PT/OT eval- CIR -- think he would be a good candidate prior to surgery -no SLP as no issues with speech or swallowing -Hgba1c less than 7 -FLP pending (on lipitor  80)-LDL 14 -neurology consulted     Chronic AF - Rate controlled - Off AC due to bleeds and sees from chart review appears to be related to hematuria but also patient is a fall risk   CKD stage IIIA -baseline around 2   Hypothyroidism -on synthroid  -TSH is elevated, will need outpatient follow-up - Free T4 is normal   BPH -proscar/flomax     HTN -resume home meds -avoid hypotension   hx of relapsed large B-cell lymphoma   s/p R-CHOP 2019 - s/p RGDP 2023 - recently treated with Epcoritamab     Recurrent UTI -appears to be abx at bedtime for prophylaxis   Chronic systolic CHF -follows with HF clinic- Dr. KATHEE -05/2024 LVEF 25-30%  -GDT as able   OSA -does not tolerate CPAP   DVT prophylaxis: Place TED hose Start: 08/10/24 1734 enoxaparin  (LOVENOX ) injection 40 mg Start: 08/09/24 1800    Code Status: Full Code Family Communication: at bedside  Disposition Plan:  Level of care: Med-Surg Status is: Observation   CIR?   Consultants:  Neurology Neurosurgery  Subjective: Appears to be progressing, he states he was able to stand which he was not able to do prior to coming in  Objective: Vitals:   08/11/24 2352 08/12/24 0434 08/12/24 0733 08/12/24 1158  BP: 124/73 138/77 (!) 140/85 139/63  Pulse: 82 82 82 96  Resp: 18 18 19 18   Temp: 98 F (36.7 C) 97.8 F (36.6 C) (!) 97.5 F (36.4 C) 98 F (36.7 C)  TempSrc:   Oral Oral  SpO2: 96% 94% 98% 95%  Weight:      Height:        Intake/Output Summary (Last 24 hours) at 08/12/2024 1220 Last data filed at 08/12/2024 0454 Gross per 24 hour  Intake 960 ml  Output 1700 ml  Net -740 ml   Filed Weights   08/08/24 1854  Weight: 115.7 kg  Examination:   General: Appearance:    Obese male in no acute distress     Lungs:      respirations unlabored  Heart:    Normal heart rate.   MS:   All extremities are intact.    Neurologic:   Awake, alert, oriented x 3. No apparent focal neurological           defect.        Data Reviewed: I have personally reviewed following labs and imaging studies  CBC: Recent Labs  Lab 08/08/24 1936 08/11/24 0943  WBC 5.9 7.7  NEUTROABS 3.8  --   HGB 15.7 16.7  HCT 47.6 49.5  MCV 102.1* 101.0*  PLT 142* 138*   Basic Metabolic Panel: Recent Labs  Lab 08/08/24 1049 08/08/24 1936 08/11/24 0943  NA 138 137 137   K 4.9 4.8 4.1  CL 95* 99 99  CO2 28 29 26   GLUCOSE 75 89 102*  BUN 32* 31* 27*  CREATININE 2.00* 2.15* 1.98*  CALCIUM  9.2 9.8 8.9   GFR: Estimated Creatinine Clearance: 42.8 mL/min (A) (by C-G formula based on SCr of 1.98 mg/dL (H)). Liver Function Tests: Recent Labs  Lab 08/08/24 1936  AST 32  ALT 24  ALKPHOS 74  BILITOT 0.7  PROT 6.0*  ALBUMIN  3.8   No results for input(s): LIPASE, AMYLASE in the last 168 hours. No results for input(s): AMMONIA in the last 168 hours. Coagulation Profile: Recent Labs  Lab 08/08/24 1936  INR 1.0   Cardiac Enzymes: Recent Labs  Lab 08/11/24 0513  CKTOTAL 58   BNP (last 3 results) No results for input(s): PROBNP in the last 8760 hours. HbA1C: Recent Labs    08/10/24 0139  HGBA1C 5.6   CBG: Recent Labs  Lab 08/08/24 1854 08/10/24 0636  GLUCAP 97 110*   Lipid Profile: Recent Labs    08/10/24 0139  CHOL 70  HDL 48  LDLCALC 14  TRIG 42  CHOLHDL 1.5   Thyroid  Function Tests: Recent Labs    08/11/24 0513 08/11/24 1512  TSH 31.742*  --   FREET4  --  0.86   Anemia Panel: Recent Labs    08/10/24 0139  VITAMINB12 907   Sepsis Labs: No results for input(s): PROCALCITON, LATICACIDVEN in the last 168 hours.  No results found for this or any previous visit (from the past 240 hours).       Radiology Studies: MR Lumbar Spine W Wo Contrast Result Date: 08/10/2024 CLINICAL DATA:  Initial evaluation for lumbar radiculopathy. EXAM: MRI LUMBAR SPINE WITHOUT AND WITH CONTRAST TECHNIQUE: Multiplanar and multiecho pulse sequences of the lumbar spine were obtained without and with intravenous contrast. CONTRAST:  10mL GADAVIST  GADOBUTROL  1 MMOL/ML IV SOLN COMPARISON:  Prior CT from 08/13/2023 and MRI from 02/08/2013. FINDINGS: Segmentation: Standard segmentation. Lowest well-formed disc space labeled the L5-S1 level. Alignment: Severe chronic compression deformity of L1 with 8 mm bony retropulsion and focal  kyphotic angulation at the thoracolumbar junction. No other listhesis. Vertebrae: Severe chronic L1 compression fracture with vertebral plana formation centrally and 8 mm bony retropulsion. No residual marrow edema or enhancement. Vertebral body height otherwise relatively well maintained with no other acute or chronic fracture. Minimal concavity at the superior endplate of L5 favored to be degenerative in nature. Underlying bone marrow signal intensity heterogeneous but with no worrisome osseous lesions. Minimal reactive marrow edema and enhancement present about the L4-5 interspace on the left, degenerative in nature. No other abnormal marrow edema or  enhancement. Conus medullaris and cauda equina: Conus extends to the L1 level. Focal kinking of the distal conus related to the chronic L1 fracture without cord signal changes (series 3, image 9). Nerve roots of the cauda equina within normal limits. No abnormal enhancement. Paraspinal and other soft tissues: Paraspinous soft tissues demonstrate no acute finding. Asymmetric right renal atrophy with hydroureteronephrosis, similar as compared to prior CT, and possibly chronic in nature. Chronic atrophy involving the right psoas muscle related to a right total hip arthroplasty noted. Disc levels: T12-L1 8 mm bony retropulsion related to the chronic L1 compression fracture. Retropulsed bone flattens and indents the ventral thecal sac. Mild bilateral facet hypertrophy. Minimal flattening and displacement of the distal conus without cord signal changes. Moderate spinal stenosis. Foramina remain patent. L1-2: Degenerative intervertebral disc space narrowing with disc bulge and disc desiccation. 8 mm retropulsed bone indents the ventral thecal sac. Mild facet and ligament flavum hypertrophy. Up to moderate spinal stenosis at the level of L1 due to bony retropulsion. Moderate bilateral L1 foraminal stenosis. L2-3: Disc desiccation with mild disc bulge, slightly asymmetric to  the left. Mild facet and ligament flavum hypertrophy. Associated trace joint effusions. No significant spinal stenosis. Moderate left L2 foraminal narrowing. Right neural foramina remains patent. L3-4: Disc desiccation with diffuse disc bulge, slightly asymmetric to the left. Mild to moderate facet and ligament flavum hypertrophy. Mild prominence of the dorsal epidural fat. Resultant moderate spinal stenosis. Mild right with mild to moderate left L3 foraminal narrowing. L4-5: Disc desiccation with mild disc bulge. Moderate facet and ligament flavum hypertrophy. Prominence of the dorsal epidural fat. Resultant moderate spinal stenosis. Moderate bilateral L4 foraminal narrowing, slightly greater on the left. L5-S1: Disc desiccation with mild disc bulge. Bulging disc mildly indents the ventral thecal sac, closely approximating the descending S1 nerve roots without overt neural impingement or displacement. Mild bilateral facet arthrosis. 1 cm complex rim enhancing lesion posterior to the left L5-S1 facet likely reflects a synovial cyst (series 3, image 12). This is not in a position to cause neural impingement. No significant spinal stenosis. Foramina remain patent. IMPRESSION: 1. Severe chronic compression deformity of L1 with 8 mm bony retropulsion and focal kyphotic angulation at the thoracolumbar junction. Resultant moderate spinal stenosis at this level with mild flattening and displacement of the distal conus, but no cord signal changes. 2. Multifactorial degenerative changes at L3-4 and L4-5 with resultant moderate spinal stenosis, with mild to moderate bilateral L3 and L4 foraminal narrowing as above. 3. Moderate left L2 foraminal stenosis related to disc bulge and facet hypertrophy. 4. Atrophy of the right psoas muscle in the setting of underlying right total hip arthroplasty. Findings could contribute to right lower extremity weakness. 5. Asymmetric right renal atrophy with hydroureteronephrosis, similar as  compared to prior CT, and possibly chronic in nature. Correlation with cross-sectional imaging of the abdomen and pelvis recommended as warranted. Electronically Signed   By: Morene Hoard M.D.   On: 08/10/2024 19:33        Scheduled Meds:  acyclovir   400 mg Oral BID   allopurinol   100 mg Oral Daily   aspirin   81 mg Oral Daily   atorvastatin   40 mg Oral Daily   buPROPion   300 mg Oral Daily   cephALEXin   250 mg Oral QHS   DULoxetine   60 mg Oral BID   enoxaparin  (LOVENOX ) injection  40 mg Subcutaneous Q24H   ezetimibe   10 mg Oral QHS   finasteride  5 mg Oral Daily  levothyroxine   125 mcg Oral Q0600   metoprolol  succinate  25 mg Oral Daily   mirabegron  ER  25 mg Oral Daily   pantoprazole   40 mg Oral Daily   tamsulosin   0.4 mg Oral Daily   Continuous Infusions:     LOS: 0 days    Time spent: 45 minutes spent on chart review, discussion with nursing staff, consultants, updating family and interview/physical exam; more than 50% of that time was spent in counseling and/or coordination of care.    Harlene RAYMOND Bowl, DO [facility] Available via Epic secure chat 7am-7pm After these hours, please refer to coverage provider listed on [email or web address] 08/12/2024, 12:20 PM

## 2024-08-13 DIAGNOSIS — I2510 Atherosclerotic heart disease of native coronary artery without angina pectoris: Secondary | ICD-10-CM | POA: Diagnosis not present

## 2024-08-13 DIAGNOSIS — Z01818 Encounter for other preprocedural examination: Secondary | ICD-10-CM | POA: Diagnosis not present

## 2024-08-13 DIAGNOSIS — R29898 Other symptoms and signs involving the musculoskeletal system: Secondary | ICD-10-CM | POA: Diagnosis not present

## 2024-08-13 DIAGNOSIS — I5020 Unspecified systolic (congestive) heart failure: Secondary | ICD-10-CM

## 2024-08-13 DIAGNOSIS — I4891 Unspecified atrial fibrillation: Secondary | ICD-10-CM | POA: Diagnosis not present

## 2024-08-13 MED ORDER — GERHARDT'S BUTT CREAM
TOPICAL_CREAM | Freq: Every day | CUTANEOUS | Status: DC
  Administered 2024-08-19: 1 via TOPICAL
  Filled 2024-08-13: qty 60

## 2024-08-13 NOTE — Progress Notes (Signed)
 Inpatient Rehab Admissions Coordinator:  Saw pt at bedside. Confirmed that pt would like to pursue CIR. Insurance authorization started. Will continue to follow.   Tinnie Yvone Cohens, MS, CCC-SLP Admissions Coordinator [phone]

## 2024-08-13 NOTE — Plan of Care (Signed)
  Problem: Education: Goal: Knowledge of General Education information will improve Description: Including pain rating scale, medication(s)/side effects and non-pharmacologic comfort measures Outcome: Progressing   Problem: Safety: Goal: Ability to remain free from injury will improve Outcome: Progressing   Problem: Skin Integrity: Goal: Risk for impaired skin integrity will decrease Outcome: Progressing   Problem: Education: Goal: Knowledge of disease or condition will improve Outcome: Progressing

## 2024-08-13 NOTE — Progress Notes (Incomplete)
 PMR Admission Coordinator Pre-Admission Assessment   Patient: [name] is an 78 y.o., male MRN: [id number] DOB: 01/31/1946 Height: 6' 4 (193 cm) Weight: 115.7 kg   Insurance Information HMO:     PPO: yes     PCP:      IPA:      80/20:      OTHER:  PRIMARY: [employer]      Policy#: [id number]      Subscriber: patient CM Name: ***      Phone#: ***     Fax#: [phone]  Pre-Cert#: ***      Employer:  Benefits:  Phone #: online [email or web address]     Name:  Eff. Date: 10/12/19-still active     Deduct: $0      Out of Pocket Max: $3,300 ($2,022.32 met)      Life Max: NA CIR: $125/admission for 10 days      SNF: 100% coverage for days 1-20, $50 co-pay for days 21-100 Outpatient: $20/visit     Co-Pay:  Home Health: 100% coverage      Co-Pay:  DME: 80% coverage     Co-Pay: 20%  Providers: in-network SECONDARY:       Policy#:      Phone#:    Artist:       Phone#:    The Data Processing Manager" for patients in Inpatient Rehabilitation Facilities with attached "Privacy Act Statement-Health Care Records" was provided and verbally reviewed with: Patient   Emergency Contact Information Contact Information       Name Relation Home Work Mobile    Ruggles,Michael Spouse   [phone]         Other Contacts   None on File        Current Medical History  Patient Admitting Diagnosis: debility d/t toxic myositis and spinal stenosis History of Present Illness: Pt is a 78 year old male with medical hx significant for: CAD, B-call lymphoma, CAD, CHF, A-fib. Pt presented to [facility] ED at [facility] on 08/08/24 d/t transient right sided weakness and numbness. Pt had 2 falls on day of presentation to ED. CT head negative for acute abnormalities. CTA head/neck negative for LVO. Neurology consulted. Pt transferred to [facility] on 08/09/24. MRI negative for acute abnormalities. MRI lumbar spine revealed severe chronic compression deformity of L1 with  8 mm bony retropulsion and focal kyphotic angulation at thoracolumar junction resulting in moderate spinal stenosis, but no cord signal changes. Therapy evaluations completed and CIR recommended d/t pt's deficits in functional mobility. Complete NIHSS TOTAL: 0   Patient's medical record from [facility] has been reviewed by the rehabilitation admission coordinator and physician.   Past Medical History      Past Medical History:  Diagnosis Date   Allergy 11/14/1975   Benign localized prostatic hyperplasia with lower urinary tract symptoms (LUTS)     Blood transfusion without reported diagnosis     CAD (coronary artery disease) cardiologist--  dr t. raford    hx of known CAD obstruction w/ collaterals (cath done @ [facility] 12-31-2011) ;   last cardiac cath 08-13-2019  showed sig. 2V CAD involving proxLAD and CTO of the RCA/  chronic total occlusion midRCA w/ bridging and L>R collaterals   Cancer (HCC) 06/17/2018   Chronic kidney disease     Diastolic CHF (HCC)      dx 06-15-2019 hospital admission (followed by pcp)   Diffuse large B cell lymphoma [facility]) oncologist-- dr onesimo--- in remission  dx 08/ 2019 -- bx left tonsill mass-- involving lymph nodes--- completed chemotherapy 10-12-2018   DJD (degenerative joint disease)     Dysthymic disorder     Environmental allergies     GAD (generalized anxiety disorder)      with dysthymia   GERD (gastroesophageal reflux disease)     History of falling      multiple times   History of kidney stones     HLD (hyperlipidemia)     Hyperlipidemia     Hypertension     Hypogonadism in male     OA (osteoarthritis)     OSA on CPAP      cpap machine-settings 17   Osteoporosis with fracture 04/24/2013   Persistent atrial fibrillation (HCC)      On Xarelto , cardiologist--- dr jonelle. [place]   Plaque psoriasis      dermatologist--- dr ryan gammon--- currently taking otezla    Post-surgical hypothyroidism      followed by pcp---  s/p total  thyroidectomy for graves disease in 1987   Sleep apnea     Wears hearing aid in both ears            Has the patient had major surgery during 100 days prior to admission? No   Family History   family history includes Alcohol  abuse in his father and sister; Arthritis in his mother; Asthma in his father; CAD (age of onset: 1) in his father.   Current Medications  Current Medications    Current Facility-Administered Medications:    acetaminophen  (TYLENOL ) tablet 650 mg, 650 mg, Oral, Q4H PRN **OR** acetaminophen  (TYLENOL ) 160 MG/5ML solution 650 mg, 650 mg, Per Tube, Q4H PRN **OR** acetaminophen  (TYLENOL ) suppository 650 mg, 650 mg, Rectal, Q4H PRN, Vann, Jessica U, DO   acyclovir  (ZOVIRAX ) tablet 400 mg, 400 mg, Oral, BID, Vann, Jessica U, DO, 400 mg at 08/11/24 9095   albuterol  (PROVENTIL ) (2.5 MG/3ML) 0.083% nebulizer solution 2.5 mg, 2.5 mg, Nebulization, Q4H PRN, Uhlorn, Garett M, RPH   allopurinol  (ZYLOPRIM ) tablet 100 mg, 100 mg, Oral, Daily, Rogelia Jerilynn RAMAN, MD, 100 mg at 08/11/24 9096   aspirin  chewable tablet 81 mg, 81 mg, Oral, Daily, Vann, Jessica U, DO, 81 mg at 08/11/24 0903   atorvastatin  (LIPITOR ) tablet 40 mg, 40 mg, Oral, Daily, Jerri Pfeiffer, MD, 40 mg at 08/11/24 9096   buPROPion  (WELLBUTRIN  XL) 24 hr tablet 300 mg, 300 mg, Oral, Daily, Rogelia Jerilynn RAMAN, MD, 300 mg at 08/11/24 9095   cephALEXin  (KEFLEX ) capsule 250 mg, 250 mg, Oral, QHS, Vann, Jessica U, DO, 250 mg at 08/10/24 2146   clonazePAM  (KLONOPIN ) tablet 0.5 mg, 0.5 mg, Oral, TID PRN, Vann, Jessica U, DO, 0.5 mg at 08/10/24 2150   DULoxetine  (CYMBALTA ) DR capsule 60 mg, 60 mg, Oral, BID, Stanek, Lawrence S, MD, 60 mg at 08/11/24 9096   enoxaparin  (LOVENOX ) injection 40 mg, 40 mg, Subcutaneous, Q24H, Vann, Jessica U, DO, 40 mg at 08/09/24 1737   ezetimibe  (ZETIA ) tablet 10 mg, 10 mg, Oral, QHS, Vann, Jessica U, DO, 10 mg at 08/10/24 2146   finasteride (PROSCAR) tablet 5 mg, 5 mg, Oral, Daily, Rogelia Jerilynn RAMAN, MD, 5 mg at 08/11/24 0904   levothyroxine  (SYNTHROID ) tablet 125 mcg, 125 mcg, Oral, Q0600, Rogelia Jerilynn RAMAN, MD, 125 mcg at 08/11/24 9372   metoprolol  succinate (TOPROL -XL) 24 hr tablet 25 mg, 25 mg, Oral, Daily, Vann, Jessica U, DO, 25 mg at 08/11/24 0903   mirabegron  ER (MYRBETRIQ ) tablet 25 mg, 25  mg, Oral, Daily, Rogelia Jerilynn RAMAN, MD, 25 mg at 08/11/24 9096   pantoprazole  (PROTONIX ) EC tablet 40 mg, 40 mg, Oral, Daily, Rogelia Jerilynn RAMAN, MD, 40 mg at 08/11/24 9096   senna-docusate (Senokot-S) tablet 1 tablet, 1 tablet, Oral, QHS PRN, Vann, Jessica U, DO   tamsulosin  (FLOMAX ) capsule 0.4 mg, 0.4 mg, Oral, Daily, Rogelia Jerilynn RAMAN, MD, 0.4 mg at 08/11/24 9096     Patients Current Diet:  Diet Order                  Diet regular Room service appropriate? Yes; Fluid consistency: Thin  Diet effective now                         Precautions / Restrictions Precautions Precautions: Fall Restrictions Weight Bearing Restrictions Per Provider Order: No    Has the patient had 2 or more falls or a fall with injury in the past year? Yes   Prior Activity Level Community (5-7x/wk): gets out of house often   Prior Functional Level Self Care: Did the patient need help bathing, dressing, using the toilet or eating? Independent   Indoor Mobility: Did the patient need assistance with walking from room to room (with or without device)? Independent   Stairs: Did the patient need assistance with internal or external stairs (with or without device)? Unknown (tries to avoid stairs)   Functional Cognition: Did the patient need help planning regular tasks such as shopping or remembering to take medications? Needed some help   Patient Information Are you of Hispanic, Latino/a,or Spanish origin?: A. No, not of Hispanic, Latino/a, or Spanish origin What is your race?: A. White Do you need or want an interpreter to communicate with a doctor or health care staff?: 0. No   Patient's  Response To:  Health Literacy and Transportation Is the patient able to respond to health literacy and transportation needs?: Yes Health Literacy - How often do you need to have someone help you when you read instructions, pamphlets, or other written material from your doctor or pharmacy?: Never In the past 12 months, has lack of transportation kept you from medical appointments or from getting medications?: No In the past 12 months, has lack of transportation kept you from meetings, work, or from getting things needed for daily living?: No   Journalist, Newspaper / Equipment Home Equipment: Shower seat, Grab bars - tub/shower, Grab bars - toilet, BSC/3in1, Agricultural Consultant (2 wheels), Rollator (4 wheels), Cane - single point, Wheelchair - manual   Prior Device Use: Indicate devices/aids used by the patient prior to current illness, exacerbation or injury? Manual wheelchair, Walker, and cane   Current Functional Level Cognition   Orientation Level: Oriented X4    Extremity Assessment (includes Sensation/Coordination)   Upper Extremity Assessment: Generalized weakness  Lower Extremity Assessment: Defer to PT evaluation RLE Deficits / Details: Hip flexion 3+/5, Knee ext 4+/5, Ankle DF 5/5 RLE Sensation: WNL RLE Coordination: decreased gross motor LLE Deficits / Details: Hip flexion 4/5, Knee ext 5/5, Ankle DF 5/5 LLE Sensation: WNL LLE Coordination: WNL     ADLs   Overall ADL's : Needs assistance/impaired Eating/Feeding: Set up Grooming: Set up, Sitting Grooming Details (indicate cue type and reason): anticipate inc A needed for standing grooming Upper Body Dressing : Contact guard assist, Bed level Upper Body Dressing Details (indicate cue type and reason): donned clean gown; anticipate inc A needed for sitting unsupported 2/2 reduced core strength (per  pt report) Lower Body Dressing: Minimal assistance, Sitting/lateral leans Lower Body Dressing Details (indicate cue type and  reason): figure four method for donning RLE sock, attempted via hip hike but unsuccessful 2/2 reduced core strength/control Toileting- Clothing Manipulation and Hygiene: Maximal assistance Toileting - Clothing Manipulation Details (indicate cue type and reason): external male catheter intact, anticipate inc A needed for other components of toileting Functional mobility during ADLs: Moderate assistance, +2 for physical assistance, Rolling walker (2 wheels)     Mobility   Overal bed mobility: Needs Assistance Bed Mobility: Supine to Sit Supine to sit: Contact guard, HOB elevated General bed mobility comments: exited to the left with CGA for safety     Transfers   Overall transfer level: Needs assistance Equipment used: Ambulation equipment used Transfers: Sit to/from Stand, Bed to chair/wheelchair/BSC Sit to Stand: Mod assist, Via lift equipment, Min assist Bed to/from chair/wheelchair/BSC transfer type:: Via Lift equipment Step pivot transfers: Mod assist, +2 physical assistance, +2 safety/equipment Transfer via Lift Equipment: Stedy General transfer comment: fluctuated between ModA to [employer] with assist to boost-up and correct posterior lean     Ambulation / Gait / Stairs / Wheelchair Mobility   Ambulation/Gait General Gait Details: deferred due to need for +2 Pre-gait activities: Bx10 weight shifting with UE support (Stedy), Bx10 marches in standing with UE support (Stedy)     Posture / Balance Dynamic Sitting Balance Sitting balance - Comments: intermittent CGA sitting unsupported EOB Balance Overall balance assessment: Needs assistance, History of Falls Sitting-balance support: No upper extremity supported, Feet supported Sitting balance-Leahy Scale: Good Sitting balance - Comments: intermittent CGA sitting unsupported EOB Standing balance support: Bilateral upper extremity supported, During functional activity, Reliant on assistive device for balance Standing  balance-Leahy Scale: Poor Standing balance comment: reliant on UE and external support, occasional posterior lean     Special considerations/life events  Skin Ecchymosis: leg/right, left    Previous Home Environment (from acute therapy documentation) Living Arrangements: Spouse/significant other  Lives With: Spouse Available Help at Discharge: Family, Available 24 hours/day Type of Home: House Home Layout: Two level Alternate Level Stairs-Rails: Right Alternate Level Stairs-Number of Steps: 3 (chair lift) Home Access: Stairs to enter Entrance Stairs-Rails: Right, Left Entrance Stairs-Number of Steps: 5 Bathroom Shower/Tub: Health Visitor: Handicapped height Bathroom Accessibility: Yes How Accessible: Accessible via walker Home Care Services: No   Discharge Living Setting Plans for Discharge Living Setting: Patient's home Type of Home at Discharge: House Discharge Home Layout: Two level Alternate Level Stairs-Number of Steps: 3 and then uses chair lift Discharge Home Access: Stairs to enter Entrance Stairs-Rails: Right, Left Entrance Stairs-Number of Steps: 5 Discharge Bathroom Shower/Tub: Walk-in shower Discharge Bathroom Toilet: Handicapped height Discharge Bathroom Accessibility: Yes How Accessible: Accessible via walker Does the patient have any problems obtaining your medications?: No   Social/Family/Support Systems Anticipated Caregiver: [name], spouse Anticipated Industrial/product Designer Information: [phone] Does Caregiver/Family have Issues with Lodging/Transportation while Pt is in Rehab?: No   Goals   Decrease burden of Care through IP rehab admission: NA   Possible need for SNF placement upon discharge: Not anticipated   Patient Condition: I have reviewed medical records from [facility], spoken with CM, and patient and spouse. I met with patient at the bedside for inpatient rehabilitation assessment.  Patient will benefit from  ongoing PT and OT, can actively participate in 3 hours of therapy a day 5 days of the week, and can make measurable gains during the admission.  Patient will also benefit from the coordinated team approach during an Inpatient Acute Rehabilitation admission.  The patient will receive intensive therapy as well as Rehabilitation physician, nursing, social worker, and care management interventions.  Due to safety, skin/wound care, disease management, medication administration, pain management, and patient education the patient requires 24 hour a day rehabilitation nursing.  The patient is currently Min-Mod A with mobility and CGA-Min A with basic ADLs.  Discharge setting and therapy post discharge at home with home health is anticipated.  Patient has agreed to participate in the Acute Inpatient Rehabilitation Program and will admit {Time; today/tomorrow:10263}.   Preadmission Screen Completed By:  Tinnie SHAUNNA Yvone Delayne, 08/11/2024 3:03 PM

## 2024-08-13 NOTE — TOC CM/SW Note (Signed)
 Transition of Care [facility]) - Inpatient Brief Assessment   Patient Details  Name: [name] MRN: [id number] Date of Birth: 02/28/1946  Transition of [facility]) CM/SW Contact:    Andrez JULIANNA George, RN Phone Number: 08/13/2024, 3:32 PM   Clinical Narrative:  CIR recommended per therapies and pt in agreement. Awaiting CIR work up [employer] following.  Transition of Care Asessment: Insurance and Status: Insurance coverage has been reviewed Patient has primary care physician: Yes Home environment has been reviewed: home with spouse   Prior/Current Home Services: No current home services Social Drivers of Health Review: SDOH reviewed no interventions necessary Readmission risk has been reviewed: Yes Transition of care needs: transition of care needs identified, TOC will continue to follow

## 2024-08-13 NOTE — Progress Notes (Signed)
 Occupational Therapy Treatment Patient Details Name: [name] MRN: [id number] DOB: [birth year] Today's Date: 08/13/2024   History of present illness 78 y.o. male presents to [facility] 08/08/24 with acute R LE weakness and decreased sensation. CT head/MRI negative. 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 MRI lumbar spine showed severe chronic compression deformity of L1, atrophy of R psoas muscle PMHx: relapsed large B-cell lymphoma, coronary artery disease, recurrent UTIs, atrial fibrillation (not on blood thinner due to bleeding/hematuria), chronic systolic heart failure.   OT comments  Patient seen in order to progress with functional activity tolerance and ADL management. Patient wanting to return to bed, with session focus on transfers and balance training. Patient with one posterior LOB onto the bed after completion of step pivot transfer, but able to complete additional sit<>stand and improved coordination to advance gait with side steps to [facility]. Education provided with regard to log roll to return to bed as patient tends to use momentum to force torso backwards and have his legs follow. OT recommendation remains appropriate; will continue to follow.       If plan is discharge home, recommend the following:  Two people to help with walking and/or transfers;A lot of help with bathing/dressing/bathroom;Assistance with cooking/housework;Assist for transportation;Help with stairs or ramp for entrance   Equipment Recommendations  Other (comment) (defer to next venue)    Recommendations for Other Services      Precautions / Restrictions Precautions Precautions: Fall Recall of Precautions/Restrictions: Intact Restrictions Weight Bearing Restrictions Per Provider Order: No       Mobility Bed Mobility Overal bed mobility: Needs Assistance Bed Mobility: Sit to Supine       Sit to supine: Contact guard assist   General bed mobility comments: CGA for safety     Transfers Overall transfer level: Needs assistance Equipment used: Rolling walker (2 wheels), None Transfers: Sit to/from Stand Sit to Stand: Mod assist, +2 safety/equipment           General transfer comment: Mod A to complete stand pivot, one posterior LOB, completing an additional stand and incremental steps to [facility]     Balance Overall balance assessment: Needs assistance, History of Falls Sitting-balance support: No upper extremity supported, Feet supported Sitting balance-Leahy Scale: Good     Standing balance support: Bilateral upper extremity supported, During functional activity, Reliant on assistive device for balance Standing balance-Leahy Scale: Poor                             ADL either performed or assessed with clinical judgement   ADL Overall ADL's : Needs assistance/impaired                         Toilet Transfer: Moderate assistance;+2 for physical assistance;+2 for safety/equipment;Stand-pivot;BSC/3in1;Rolling walker (2 wheels)           Functional mobility during ADLs: Moderate assistance;+2 for physical assistance;Rolling walker (2 wheels) General ADL Comments: Patient seen in order to progress with functional activity tolerance and ADL management. Patient wanting to return to bed, with session focus on transfers and balance training. Patient with one posterior LOB onto the bed after completion of step pivot transfer, but able to complete additional sit<>stand and improved coordination to advance gait with side steps to [facility]. Education provided with regard to log roll to return to bed as patient tends to use momentum to force torso backwards and have his legs follow. OT recommendation  remains appropriate; will continue to follow.    Extremity/Trunk Assessment              Occupational Psychologist Communication: Impaired Factors Affecting Communication: Hearing impaired    Cognition Arousal: Alert Behavior During Therapy: WFL for tasks assessed/performed Cognition: No apparent impairments                               Following commands: Intact        Cueing   Cueing Techniques: Verbal cues, Tactile cues, Visual cues  Exercises      Shoulder Instructions       General Comments      Pertinent Vitals/ Pain       Pain Assessment Pain Assessment: No/denies pain  Home Living                                          Prior Functioning/Environment              Frequency  Min 2X/week        Progress Toward Goals  OT Goals(current goals can now be found in the care plan section)  Progress towards OT goals: Progressing toward goals  Acute Rehab OT Goals Patient Stated Goal: to get stronger OT Goal Formulation: With patient/family Time For Goal Achievement: 08/24/24 Potential to Achieve Goals: Good  Plan      Co-evaluation                 AM-PAC OT 6 Clicks Daily Activity     Outcome Measure   Help from another person eating meals?: None Help from another person taking care of personal grooming?: A Little Help from another person toileting, which includes using toliet, bedpan, or urinal?: A Lot Help from another person bathing (including washing, rinsing, drying)?: A Lot Help from another person to put on and taking off regular upper body clothing?: A Little Help from another person to put on and taking off regular lower body clothing?: A Little 6 Click Score: 17    End of Session Equipment Utilized During Treatment: Rolling walker (2 wheels)  OT Visit Diagnosis: Unsteadiness on feet (R26.81);Repeated falls (R29.6);History of falling (Z91.81)   Activity Tolerance Patient tolerated treatment well   Patient Left in bed;with call bell/phone within reach;with bed alarm set;with family/visitor present   Nurse Communication Mobility status        Time: 8597-8571 OT Time  Calculation (min): 26 min  Charges: OT General Charges $OT Visit: 1 Visit OT Treatments $Self Care/Home Management : 23-37 mins  Ronal Gift E. Bolivar Koranda, OTR/L Acute Rehabilitation Services [phone]   Ronal Gift Salt 08/13/2024, 3:50 PM

## 2024-08-13 NOTE — Plan of Care (Signed)
  Problem: Education: Goal: Knowledge of General Education information will improve Description: Including pain rating scale, medication(s)/side effects and non-pharmacologic comfort measures Outcome: Progressing   Problem: Health Behavior/Discharge Planning: Goal: Ability to manage health-related needs will improve Outcome: Progressing   Problem: Clinical Measurements: Goal: Ability to maintain clinical measurements within normal limits will improve Outcome: Progressing Goal: Will remain free from infection Outcome: Progressing Goal: Diagnostic test results will improve Outcome: Progressing Goal: Respiratory complications will improve Outcome: Progressing Goal: Cardiovascular complication will be avoided Outcome: Progressing   Problem: Activity: Goal: Risk for activity intolerance will decrease Outcome: Progressing   Problem: Nutrition: Goal: Adequate nutrition will be maintained Outcome: Progressing   Problem: Coping: Goal: Level of anxiety will decrease Outcome: Progressing   Problem: Elimination: Goal: Will not experience complications related to bowel motility Outcome: Progressing Goal: Will not experience complications related to urinary retention Outcome: Progressing   Problem: Pain Managment: Goal: General experience of comfort will improve and/or be controlled Outcome: Progressing   Problem: Safety: Goal: Ability to remain free from injury will improve Outcome: Progressing   Problem: Skin Integrity: Goal: Risk for impaired skin integrity will decrease Outcome: Progressing   Problem: Education: Goal: Knowledge of disease or condition will improve Outcome: Progressing Goal: Knowledge of secondary prevention will improve (MUST DOCUMENT ALL) Outcome: Progressing Goal: Knowledge of patient specific risk factors will improve (DELETE if not current risk factor) Outcome: Progressing   Problem: Ischemic Stroke/TIA Tissue Perfusion: Goal: Complications of  ischemic stroke/TIA will be minimized Outcome: Progressing   Problem: Coping: Goal: Will verbalize positive feelings about self Outcome: Progressing Goal: Will identify appropriate support needs Outcome: Progressing   Problem: Health Behavior/Discharge Planning: Goal: Ability to manage health-related needs will improve Outcome: Progressing Goal: Goals will be collaboratively established with patient/family Outcome: Progressing   Problem: Self-Care: Goal: Ability to participate in self-care as condition permits will improve Outcome: Progressing Goal: Verbalization of feelings and concerns over difficulty with self-care will improve Outcome: Progressing Goal: Ability to communicate needs accurately will improve Outcome: Progressing   Problem: Nutrition: Goal: Risk of aspiration will decrease Outcome: Progressing Goal: Dietary intake will improve Outcome: Progressing

## 2024-08-13 NOTE — Consult Note (Cosign Needed)
 Cardiology Consultation   Patient ID: [name] MRN: [id number]; DOB: 1946/02/27  Admit date: 08/08/2024 Date of Consult: 08/13/2024  PCP:  Garald Karlynn GAILS, MD   [facility] Providers Cardiologist:  Annabella Scarce, MD  Cardiology APP:  Vannie Reche RAMAN, NP      Patient Profile: [name] is a 78 y.o. male with a hx of B-cell lymphoma, recurrent UTIs, OSA, CAD, atrial fibrillation not on anticoagulation, and systolic heart failure who is being seen 08/13/2024 for the evaluation of preoperative risk assessment at the request of Harlene Bowl DO.  History of Present Illness: [name] is a 77 year old male with prior cardiac history listed below.  He was initially diagnosed with atrial fibrillation in 2012.  He was diagnosed with B-cell lymphoma on 05/2018 and has completed 6 cycles of chemotherapy.  An echo on 06/2018 showed an LVEF of 50 to 55% and apical hypokinesis.  A cardiac catheterization was done on 08/2019 and showed two-vessel CAD with sequential stenosis in the mid LAD and complete total occlusion in the mid RCA.  Because of this the patient was recommended for CABG but was deemed to not be a candidate for surgery.  On 03/2022 the patient's chemo was resumed due to reoccurrence of diffuse large B-cell lymphoma (DLBCL.)  On 04/2022 the patient was admitted for urosepsis secondary to E. coli.  On 10/2022 the patient Xarelto  was stopped due to concerns of hematuria and frequent falls.  On 11/2023 the patient was on Epcoritamab  for refractory DLBCL and was being followed by [facility] oncology.  On 05/2024 an echo was done and showed a reduced LVEF of 25 to 30%.  This was decreased from prior echo that showed 35 to 40%.  3-day ZIO was ordered to assess rate control of atrial fibrillation.  The monitor showed 100% burden of atrial fibrillation with average heart rate of 77.  Had a cardiac catheterization done on 06/2024 that showed 70 to 80% stenosis  in the proximal LAD, and chronic total occlusion in the mid RCA.  Upper normal LV filling pressure(LVEDP 15), moderately elevated RV filling pressure (RA 12, RV 44/10 mmHg), mild to moderate pulmonary hypertension, and mildly reduced Fick CO/CI (CO 5.0 L/min, CI 2.1 L/min/m^2).  These findings were recommended to be managed medically.  The patient's heart failure was felt to be mixed ischemic and nonischemic.  A sleep study was done on 07/17/2024 and found severe OSA and had an AHI 30.7/hr.  A cardiac MRI was done on 07/23/2024 and showed normal LV size with global hypokinesis and an LVEF of 35%, normal RV size with EF of 40%, and  Nonspecific mid-wall LGE at the basal inferior RV insertion site and in the basal septum. This pattern can be seen with pressure/volume overload.  Patient presented to the emergency department for right-sided left leg weakness.  Stroke evaluation was negative. MRI of the spine showed compression deformity of the L1 with 8 mm bony retropulsion and focal kyphotic angulation at the thoracolumbar junction. Resultant moderate spinal stenosis at this level with mild flattening and displacement of the distal conus, but no cord signal changes.  Neurosurgery was recommended to see the patient.  Neurosurgery is planning to follow-up in the outpatient setting. Cardiology was consulted incase the patient needs an urgent surgery.  On interview patient reported that he came to the hospital for leg weakness as mentioned above. Reported that these symptoms had been going on for 2 weeks prior to coming to the hospital.  For the last 12 years has required a walker  to walk. Prior to the leg weakness starting was able to walk longer distances like going to a movie theater at a mall. Places items on tray on walker to carry them. Can also look after himself. Gets short of breath walking up ramps. Denies recent tobacco use or illicit substance use.   Labs showed A1c of 5.6, potassium of 4.1, anemia of  1.98, evaded BUN of 27, hemoglobin of 16.7, and WBC count of 7.7.  TTE on 08/10/2024 showed an LVEF of 35 to 40%, global hypokinesis, trivial MR, and IVC with greater than 50% respiratory variability  Past Medical History:  Diagnosis Date   Allergy 11/14/1975   Benign localized prostatic hyperplasia with lower urinary tract symptoms (LUTS)    Blood transfusion without reported diagnosis    CAD (coronary artery disease) cardiologist--  dr t. raford   hx of known CAD obstruction w/ collaterals (cath done @ [facility] 12-31-2011) ;   last cardiac cath 08-13-2019  showed sig. 2V CAD involving proxLAD and CTO of the RCA/  chronic total occlusion midRCA w/ bridging and L>R collaterals   Cancer (HCC) 06/17/2018   Chronic kidney disease    Diastolic CHF (HCC)    dx 06-15-2019 hospital admission (followed by pcp)   Diffuse large B cell lymphoma [facility]) oncologist-- dr onesimo--- in remission   dx 08/ 2019 -- bx left tonsill mass-- involving lymph nodes--- completed chemotherapy 10-12-2018   DJD (degenerative joint disease)    Dysthymic disorder    Environmental allergies    GAD (generalized anxiety disorder)    with dysthymia   GERD (gastroesophageal reflux disease)    History of falling    multiple times   History of kidney stones    HLD (hyperlipidemia)    Hyperlipidemia    Hypertension    Hypogonadism in male    OA (osteoarthritis)    OSA on CPAP    cpap machine-settings 17   Osteoporosis with fracture 04/24/2013   Persistent atrial fibrillation (HCC)    On Xarelto , cardiologist--- dr jonelle. Gutierrez   Plaque psoriasis    dermatologist--- dr ryan gammon--- currently taking otezla    Post-surgical hypothyroidism    followed by pcp---  s/p total thyroidectomy for graves disease in 1987   Sleep apnea    Wears hearing aid in both ears     Past Surgical History:  Procedure Laterality Date   APPENDECTOMY  2011 approx.    CARDIAC CATHETERIZATION  12-31-2011   dr j. nolene @[facility]    normal  LVF w/ multivessel CAD,  occluded RCA w/ colleterals   CATARACT EXTRACTION W/ INTRAOCULAR LENS  IMPLANT, BILATERAL  2016 approx.   COLONOSCOPY     CORONARY PRESSURE/FFR STUDY N/A 08/13/2019   Procedure: INTRAVASCULAR PRESSURE WIRE/FFR STUDY;  Surgeon: Mady Bruckner, MD;  [facility]: [facility];  Service: Cardiovascular;  Laterality: N/A;   CORONARY PRESSURE/FFR WITH 3D MAPPING N/A 07/02/2024   Procedure: Coronary Pressure/FFR w/3D Mapping;  Surgeon: Mady Bruckner, MD;  [facility]: [facility];  Service: Cardiovascular;  Laterality: N/A;   CYSTOSCOPY WITH RETROGRADE PYELOGRAM, URETEROSCOPY AND STENT PLACEMENT Left 11/07/2017   Procedure: CYSTOSCOPY WITH RETROGRADE PYELOGRAM, URETEROSCOPY AND STENT PLACEMENT;  Surgeon: Sherrilee Belvie CROME, MD;  Location: [facility];  Service: Urology;  Laterality: Left;   CYSTOSCOPY WITH RETROGRADE PYELOGRAM, URETEROSCOPY AND STENT PLACEMENT Right 10/22/2019   Procedure: CYSTOSCOPY WITH RETROGRADE PYELOGRAM, URETEROSCOPY AND STENT PLACEMENT;  Surgeon: Sherrilee Belvie CROME, MD;  Location: [facility];  Service: Urology;  Laterality: Right;   CYSTOSCOPY/URETEROSCOPY/HOLMIUM LASER/STENT PLACEMENT Right 03/11/2017   Procedure: CYSTOSCOPY/URETEROSCOPYSTENT PLACEMENT right ureter retrograde pylegram;  Surgeon: Sherrilee Belvie CROME, MD;  Location: [facility];  Service: Urology;  Laterality: Right;   EYE SURGERY  08/11/2017   Cataract   HARDWARE REMOVAL  10/05/2012   Procedure: HARDWARE REMOVAL;  Surgeon: Donnice JONETTA Car, MD;  Location: [facility];  Service: Orthopedics;  Laterality: Right;  REMOVING  STRYKER  GAMMA NAIL   HARDWARE REMOVAL Right 07/03/2013   Procedure: HARDWARE REMOVAL RIGHT TIBIA ;  Surgeon: [name] VEAR Bruch, MD;  Location: [facility];  Service: Orthopedics;  Laterality: Right;   HIP CLOSED REDUCTION Right 10/15/2013   Procedure: CLOSED MANIPULATION HIP;  Surgeon: Donnice JONETTA Car, MD;  Location: [facility];  Service: Orthopedics;  Laterality: Right;    HOLMIUM LASER APPLICATION Right 02/08/2017   Procedure: HOLMIUM LASER APPLICATION;  Surgeon: Belvie CROME Sherrilee, MD;  [facility]: [facility];  Service: Urology;  Laterality: Right;   HOLMIUM LASER APPLICATION Left 03/26/2019   Procedure: HOLMIUM LASER APPLICATION;  Surgeon: Sherrilee Belvie CROME, MD;  [facility]: [facility];  Service: Urology;  Laterality: Left;   HOLMIUM LASER APPLICATION Left 04/17/2019   Procedure: HOLMIUM LASER APPLICATION;  Surgeon: Sherrilee Belvie CROME, MD;  [facility];  Service: Urology;  Laterality: Left;   HOLMIUM LASER APPLICATION Right 10/22/2019   Procedure: HOLMIUM LASER APPLICATION;  Surgeon: Sherrilee Belvie CROME, MD;  Location: [facility];  Service: Urology;  Laterality: Right;   INCISION AND DRAINAGE HIP Right 11/16/2013   Procedure: IRRIGATION AND DEBRIDEMENT RIGHT HIP;  Surgeon: Donnice JONETTA Car, MD;  [facility]: [facility];  Service: Orthopedics;  Laterality: Right;   IR IMAGING GUIDED PORT INSERTION  06/22/2018   IR NEPHROSTOMY PLACEMENT LEFT  03/28/2019   IR URETERAL STENT LEFT NEW ACCESS W/O SEP NEPHROSTOMY CATH  10/24/2017   IR URETERAL STENT LEFT NEW ACCESS W/O SEP NEPHROSTOMY CATH  03/26/2019   JOINT REPLACEMENT     NEPHROLITHOTOMY Right 02/08/2017   Procedure: NEPHROLITHOTOMY PERCUTANEOUS WITH SURGEON ACCESS;  Surgeon: Belvie CROME Sherrilee, MD;  [facility];  Service: Urology;  Laterality: Right;   NEPHROLITHOTOMY Left 10/24/2017   Procedure: NEPHROLITHOTOMY PERCUTANEOUS;  Surgeon: Sherrilee Belvie CROME, MD;  [facility];  Service: Urology;  Laterality: Left;   NEPHROLITHOTOMY Left 03/26/2019   Procedure: NEPHROLITHOTOMY PERCUTANEOUS;  Surgeon: Sherrilee Belvie CROME, MD;  [facility];  Service: Urology;  Laterality: Left;  2 HRS   NEPHROLITHOTOMY Left 04/17/2019   Procedure: NEPHROLITHOTOMY PERCUTANEOUS;  Surgeon: Sherrilee Belvie CROME, MD;  Location[facility];  Service: Urology;  Laterality: Left;  2 HRS   ORIF TIBIA FRACTURE Right 02/06/2013    Procedure: OPEN REDUCTION INTERNAL FIXATION (ORIF) TIBIA FRACTURE WITH IM ROD FIBULA;  Surgeon: [name] VEAR Bruch, MD;  [facility];  Service: Orthopedics;  Laterality: Right;   ORIF TIBIA FRACTURE Right 07/03/2013   Procedure: RIGHT TIBIA NON UNION REPAIR ;  Surgeon: [name] VEAR Bruch, MD;  Location: [facility];  Service: Orthopedics;  Laterality: Right;   ORIF WRIST FRACTURE  10/02/2012   Procedure: OPEN REDUCTION INTERNAL FIXATION (ORIF) WRIST FRACTURE;  Surgeon: Elsie Mussel, MD;  [facility];  Service: Orthopedics;  Laterality: Right;  WITH   ANTIBIOTIC  CEMENT   ORIF WRIST FRACTURE Left 10/28/2013   Procedure: OPEN REDUCTION INTERNAL FIXATION (ORIF) WRIST FRACTURE with allograft;  Surgeon: Elsie Mussel, MD;  [facility];  Service: Orthopedics;  Laterality: Left;  DVR Plate   QUADRICEPS TENDON REPAIR Left 07/15/2017   Procedure: REPAIR QUADRICEP TENDON;  Surgeon: Liam Lerner, MD;  Location: [facility];  Service: Orthopedics;  Laterality: Left;   RIGHT/LEFT HEART CATH AND CORONARY ANGIOGRAPHY N/A 08/13/2019   Procedure: RIGHT/LEFT HEART CATH AND CORONARY ANGIOGRAPHY;  Surgeon: Mady Bruckner, MD;  [facility];  Service: Cardiovascular;  Laterality: N/A;   RIGHT/LEFT HEART CATH AND CORONARY ANGIOGRAPHY N/A 07/02/2024   Procedure: RIGHT/LEFT HEART CATH AND CORONARY ANGIOGRAPHY;  Surgeon: Mady Bruckner, MD;  [facility];  Service: Cardiovascular;  Laterality: N/A;   THYROIDECTOMY  02/1986   TOTAL HIP ARTHROPLASTY Right 03-16-2016   @[facility]    TOTAL HIP REVISION  10/05/2012   Procedure: TOTAL HIP REVISION;  Surgeon: Donnice JONETTA Car, MD;  [facility];  Service: Orthopedics;  Laterality: Right;  RIGHT TOTAL HIP REVISION   TOTAL HIP REVISION Right 09/17/2013   Procedure: REVISION RIGHT TOTAL HIP ARTHROPLASTY ;  Surgeon: Donnice JONETTA Car, MD;  [facility]: [facility];  Service: Orthopedics;  Laterality: Right;   TOTAL HIP REVISION Right 10/26/2013   Procedure:  REVISION RIGHT TOTAL HIP ARTHROPLASTY;  Surgeon: Donnice JONETTA Car, MD;  [facility];  Service: Orthopedics;  Laterality: Right;   TOTAL KNEE ARTHROPLASTY Bilateral right 03-15-2011;  left 06-30-2011   TOTAL KNEE REVISION Left 04/11/2017   Procedure: TOTAL KNEE REVISION PATELLA and TIBIA;  Surgeon: Liam Lerner, MD;  Location[facility];  Service: Orthopedics;  Laterality: Left;     Home Medications:  Prior to Admission medications   Medication Sig Start Date End Date Taking? Authorizing Provider  acetaminophen  (TYLENOL ) 325 MG tablet Take 2 tablets (650 mg total) by mouth every 6 (six) hours as needed for mild pain (or Fever >/= 101). 03/25/22  Yes Sheikh, Omair Latif, DO  acyclovir  (ZOVIRAX ) 400 MG tablet Take 1 tablet (400 mg total) by mouth 2 (two) times daily. 05/28/24  Yes Plotnikov, Karlynn GAILS, MD  albuterol  (VENTOLIN  HFA) 108 (90 Base) MCG/ACT inhaler Inhale 2 puffs into the lungs every 6 (six) hours as needed for wheezing or shortness of breath.   Yes [provider]  alfuzosin  (UROXATRAL ) 10 MG 24 hr tablet TAKE 1 TABLET AT BEDTIME 04/03/24  Yes McKenzie, Belvie CROME, MD  allopurinol  (ZYLOPRIM ) 100 MG tablet TAKE 1 TABLET EVERY DAY 01/31/24  Yes Plotnikov, Aleksei V, MD  buPROPion  (WELLBUTRIN  XL) 300 MG 24 hr tablet TAKE 1 TABLET EVERY DAY WITH BREAKFAST 12/19/23  Yes Plotnikov, Aleksei V, MD  Calcium  Citrate-Vitamin D  (CALCIUM  CITRATE + D PO) Take 1 tablet by mouth 2 (two) times daily. 1200mg  of Calcium  and 1000 units vitamin D3   Yes [provider]  cephALEXin  (KEFLEX ) 250 MG capsule Take 250 mg by mouth daily.   Yes [provider]  Cholecalciferol  (VITAMIN D ) 50 MCG (2000 UT) tablet Take 4,000 Units by mouth in the morning and at bedtime.   Yes [provider]  clonazePAM  (KLONOPIN ) 1 MG tablet TAKE 1 TABLET THREE TIMES DAILY AS NEEDED FOR ANXIETY 04/12/24  Yes Plotnikov, Aleksei V, MD  CRANBERRY PO Take 1 tablet by mouth in the morning and at bedtime.   Yes  [provider]  denosumab  (PROLIA ) 60 MG/ML SOSY injection Inject 60 mg as directed every 6 (six) months. 12/10/15  Yes [provider]  DULoxetine  (CYMBALTA ) 60 MG capsule TAKE 1 CAPSULE TWICE DAILY 01/16/24  Yes Plotnikov, Aleksei V, MD  Evolocumab (REPATHA SURECLICK) 140  MG/ML SOAJ Inject 140 mg into the skin every 14 (fourteen) days. 07/26/24  Yes Vannie Reche RAMAN, NP  ezetimibe  (ZETIA ) 10 MG tablet Take 1 tablet (10 mg total) by mouth daily. 05/28/24  Yes Plotnikov, Karlynn GAILS, MD  ferrous sulfate  325 (65 FE) MG tablet Take 325 mg by mouth 2 (two) times daily with a meal.   Yes [provider]  finasteride (PROSCAR) 5 MG tablet Take 5 mg by mouth daily. 09/25/23  Yes [provider]  loratadine  (CLARITIN ) 10 MG tablet Take 10 mg by mouth daily.   Yes [provider]  metoprolol  succinate (TOPROL  XL) 25 MG 24 hr tablet Take 1 tablet (25 mg total) by mouth daily. 08/08/24  Yes Milford, Harlene HERO, FNP  Multiple Vitamin (MULTIVITAMIN WITH MINERALS) TABS tablet Take 1 tablet by mouth every morning. Men's One-A-Day 50+   Yes [provider]  omeprazole  (PRILOSEC) 40 MG capsule TAKE 1 CAPSULE EVERY DAY BEFORE BREAKFAST 01/16/24  Yes Plotnikov, Aleksei V, MD  Potassium Citrate 15 MEQ (1620 MG) TBCR TAKE 1 TABLET IN THE MORNING AND AT BEDTIME 02/21/24  Yes McKenzie, Belvie CROME, MD  Probiotic Product (PROBIOTIC PO) Take 1 capsule by mouth daily with breakfast.   Yes [provider]  SKYRIZI PEN 150 MG/ML pen Inject 150 mg into the skin every 3 (three) months. 09/10/23  Yes [provider]  tamsulosin  (FLOMAX ) 0.4 MG CAPS capsule Take 1 capsule by mouth daily.   Yes [provider]  testosterone  enanthate (DELATESTRYL ) 200 MG/ML injection Inject 1 mL (200 mg total) into the muscle every 14 (fourteen) days. Every other Friday 06/13/24  Yes Plotnikov, Karlynn GAILS, MD  vitamin C  (ASCORBIC ACID ) 500 MG tablet Take 500 mg by mouth daily with  lunch.   Yes [provider]  amoxicillin  (AMOXIL ) 500 MG capsule Take 2,000 mg by mouth daily as needed (for dental procedures).    [provider]  atorvastatin  (LIPITOR ) 80 MG tablet Take 1 tablet (80 mg total) by mouth daily. 05/28/24   Plotnikov, Karlynn GAILS, MD  cephALEXin  (KEFLEX ) 250 MG capsule Take 1 capsule (250 mg total) by mouth at bedtime. Patient not taking: Reported on 08/09/2024 06/05/24   Vannie Reche RAMAN, NP  EPINEPHrine  0.3 mg/0.3 mL IJ SOAJ injection Inject 0.3 mg into the muscle once as needed for anaphylaxis (severe allergic reaction). 07/31/21   [provider]  levothyroxine  (SYNTHROID ) 125 MCG tablet TAKE 1 TABLET EVERY DAY AT 6AM 08/08/24   Plotnikov, Aleksei V, MD  losartan  (COZAAR ) 25 MG tablet Take 0.5 tablets (12.5 mg total) by mouth every evening. 07/16/24 10/14/24  Vannie Reche RAMAN, NP  MYRBETRIQ  25 MG TB24 tablet TAKE 1 TABLET EVERY DAY 10/06/21   McKenzie, Belvie CROME, MD  nitroGLYCERIN  (NITROSTAT ) 0.4 MG SL tablet Place 0.4 mg under the tongue every 5 (five) minutes as needed for chest pain.    [provider]  solifenacin (VESICARE) 10 MG tablet Take 10 mg by mouth daily.    [provider]    Scheduled Meds:  acyclovir   400 mg Oral BID   allopurinol   100 mg Oral Daily   aspirin   81 mg Oral Daily   atorvastatin   40 mg Oral Daily   buPROPion   300 mg Oral Daily   cephALEXin   250 mg Oral QHS   DULoxetine   60 mg Oral BID   enoxaparin  (LOVENOX ) injection  40 mg Subcutaneous Q24H   ezetimibe   10 mg Oral QHS   finasteride  5 mg Oral Daily   Gerhardt's butt cream   Topical Daily   levothyroxine   125 mcg Oral Q0600   metoprolol  succinate  25 mg Oral Daily   mirabegron  ER  25 mg Oral Daily   pantoprazole   40 mg Oral Daily   tamsulosin   0.4 mg Oral Daily   Continuous Infusions:  PRN Meds: acetaminophen  **OR** acetaminophen  (TYLENOL ) oral liquid 160 mg/5 mL **OR** acetaminophen , albuterol , clonazePAM ,  senna-docusate  Allergies:    Allergies  Allergen Reactions   Short Ragweed Pollen Ext Cough    Social History:   Social History   Socioeconomic History   Marital status: Married    Spouse name: [name]   Number of children: Not on file   Years of education: Not on file   Highest education level: Master's degree (e.g., MA, MS, MEng, MEd, MSW, MBA)  Occupational History   Occupation: RETIRED  Tobacco Use   Smoking status: Former    Current packs/day: 0.00    Average packs/day: 1 pack/day for 50.0 years (50.0 ttl pk-yrs)    Types: Cigarettes    Start date: 01/11/1962    Quit date: 01/12/2012    Years since quitting: 12.5    Passive exposure: Past   Smokeless tobacco: Never  Vaping Use   Vaping status: Never Used  Substance and Sexual Activity   Alcohol  use: Yes    Comment: rarely   Drug use: Never   Sexual activity: Yes  Other Topics Concern   Not on file  Social History Narrative   Lives In home with spouse [name]      Social Drivers of Health   Financial Resource Strain: Low Risk  (05/24/2024)   Overall Financial Resource Strain (CARDIA)    Difficulty of Paying Living Expenses: Not hard at all  Food Insecurity: No Food Insecurity (08/09/2024)   Hunger Vital Sign    Worried About Running Out of Food in the Last Year: Never true    Ran Out of Food in the Last Year: Never true  Transportation Needs: No Transportation Needs (08/09/2024)   PRAPARE - Administrator, Civil Service (Medical): No    Lack of Transportation (Non-Medical): No  Physical Activity: Inactive (05/24/2024)   Exercise Vital Sign    Days of Exercise per Week: 0 days    Minutes of Exercise per Session: Not on file  Stress: Stress Concern Present (05/24/2024)   [employer] of Occupational Health - Occupational Stress Questionnaire    Feeling of Stress: To some extent  Social Connections: Moderately Integrated (08/09/2024)   Social Connection and Isolation Panel    Frequency of  Communication with Friends and Family: More than three times a week    Frequency of Social Gatherings with Friends and Family: Once a week    Attends Religious Services: More than 4 times per year    Active Member of [employer] or Organizations: No    Attends Banker Meetings: Never    Marital Status: Married  Recent Concern: Social Connections - Moderately Isolated (05/24/2024)   Social Connection and Isolation Panel    Frequency of Communication with Friends and Family: Never    Frequency of Social Gatherings with Friends and Family: Never    Attends Religious Services: More than 4 times per year    Active Member of [employer] or Organizations: No    Attends Banker Meetings: Not on file    Marital Status: Married  Intimate Partner Violence: Not At Risk (08/09/2024)  Humiliation, Afraid, Rape, and Kick questionnaire    Fear of Current or Ex-Partner: No    Emotionally Abused: No    Physically Abused: No    Sexually Abused: No    Family History:    Family History  Problem Relation Age of Onset   CAD Father 92   Asthma Father    Alcohol  abuse Father    Arthritis Mother    Alcohol  abuse Sister      ROS:  Please see the history of present illness.   All other ROS reviewed and negative.     Physical Exam/Data: Vitals:   08/13/24 0010 08/13/24 0505 08/13/24 0808 08/13/24 1140  BP: 130/73 120/68 125/81 116/78  Pulse: 78 84 83 (!) 104  Resp: 18 18 20 18   Temp: 98.6 F (37 C) 97.9 F (36.6 C) 98 F (36.7 C) 98.6 F (37 C)  TempSrc: Oral Oral  Oral  SpO2: 91% 93% 93% 92%  Weight:      Height:        Intake/Output Summary (Last 24 hours) at 08/13/2024 1420 Last data filed at 08/13/2024 [id number] Gross per 24 hour  Intake 480 ml  Output 1950 ml  Net -1470 ml      08/08/2024    6:54 PM 08/08/2024   10:15 AM 07/24/2024   10:07 AM  Last 3 Weights  Weight (lbs) 255 lb 253 lb 255 lb  Weight (kg) 115.667 kg 114.76 kg 115.667 kg     Body mass index is  31.04 kg/m.  General:  Well nourished, well developed, in no acute distress. Alert and orientated on room air HEENT: normal Neck: no JVD Vascular: No carotid bruits; Distal pulses 2+ bilaterally Cardiac:  normal S1, S2; irregularly irregular rhythm; no murmur  Lungs: Diffuse wheezing heard throughout the lungs. Abd: soft, nontender, no hepatomegaly  Ext: no edema Musculoskeletal:  No deformities, BUE and BLE strength normal and equal Skin: warm and dry  Neuro:  no focal abnormalities noted Psych:  Normal affect   EKG:  The EKG was personally reviewed and demonstrates: Atrial fibrillation with a rate of 92 Telemetry:  Telemetry was personally reviewed and demonstrates: Atrial fibrillation with heart rates in the 80s to 120s  Relevant CV Studies: Echocardiogram on 08/10/2024 IMPRESSIONS     1. Left ventricular ejection fraction, by estimation, is 35 to 40%. The  left ventricle has moderately decreased function. The left ventricle  demonstrates global hypokinesis. The left ventricular internal cavity size  was mildly dilated. Diastology  indeterminate due to atrial fibrillation.   2. Right ventricular systolic function is mildly reduced. The right  ventricular size is normal. Tricuspid regurgitation signal is inadequate  for assessing PA pressure.   3. The mitral valve is normal in structure. Trivial mitral valve  regurgitation. No evidence of mitral stenosis.   4. The aortic valve was not well visualized. Aortic valve regurgitation  is not visualized. No aortic stenosis is present.   5. The inferior vena cava is normal in size with greater than 50%  respiratory variability, suggesting right atrial pressure of 3 mmHg.   Comparison(s): Improvement in LV systolic function.   Conclusion(s)/Recommendation(s): Improvement in LVEF to 35-40%. Very  limited valvular assessment due to poor acoustic windows. Within this  limitation, no notable valvular disease.    Laboratory  Data: High Sensitivity Troponin:  No results for input(s): TROPONINIHS in the last 720 hours.   Chemistry Recent Labs  Lab 08/08/24 1049 08/08/24 1936 08/11/24 0943  NA 138  137 137  K 4.9 4.8 4.1  CL 95* 99 99  CO2 28 29 26   GLUCOSE 75 89 102*  BUN 32* 31* 27*  CREATININE 2.00* 2.15* 1.98*  CALCIUM  9.2 9.8 8.9  GFRNONAA 34* 31* 34*  ANIONGAP 15 9 12     Recent Labs  Lab 08/08/24 1936  PROT 6.0*  ALBUMIN  3.8  AST 32  ALT 24  ALKPHOS 74  BILITOT 0.7   Lipids  Recent Labs  Lab 08/10/24 0139  CHOL 70  TRIG 42  HDL 48  LDLCALC 14  CHOLHDL 1.5    Hematology Recent Labs  Lab 08/08/24 1936 08/11/24 0943  WBC 5.9 7.7  RBC 4.66 4.90  HGB 15.7 16.7  HCT 47.6 49.5  MCV 102.1* 101.0*  MCH 33.7 34.1*  MCHC 33.0 33.7  RDW 15.8* 15.7*  PLT 142* 138*   Thyroid   Recent Labs  Lab 08/11/24 0513 08/11/24 1512 08/12/24 1133  TSH 31.742*  --   --   FREET4  --    < > 0.95   < > = values in this interval not displayed.    BNP Recent Labs  Lab 08/08/24 1049  BNP 80.5    DDimer No results for input(s): DDIMER in the last 168 hours.  Radiology/Studies:  MR Lumbar Spine W Wo Contrast Result Date: 08/10/2024 CLINICAL DATA:  Initial evaluation for lumbar radiculopathy. EXAM: MRI LUMBAR SPINE WITHOUT AND WITH CONTRAST TECHNIQUE: Multiplanar and multiecho pulse sequences of the lumbar spine were obtained without and with intravenous contrast. CONTRAST:  10mL GADAVIST  GADOBUTROL  1 MMOL/ML IV SOLN COMPARISON:  Prior CT from 08/13/2023 and MRI from 02/08/2013. FINDINGS: Segmentation: Standard segmentation. Lowest well-formed disc space labeled the L5-S1 level. Alignment: Severe chronic compression deformity of L1 with 8 mm bony retropulsion and focal kyphotic angulation at the thoracolumbar junction. No other listhesis. Vertebrae: Severe chronic L1 compression fracture with vertebral plana formation centrally and 8 mm bony retropulsion. No residual marrow edema or  enhancement. Vertebral body height otherwise relatively well maintained with no other acute or chronic fracture. Minimal concavity at the superior endplate of L5 favored to be degenerative in nature. Underlying bone marrow signal intensity heterogeneous but with no worrisome osseous lesions. Minimal reactive marrow edema and enhancement present about the L4-5 interspace on the left, degenerative in nature. No other abnormal marrow edema or enhancement. Conus medullaris and cauda equina: Conus extends to the L1 level. Focal kinking of the distal conus related to the chronic L1 fracture without cord signal changes (series 3, image 9). Nerve roots of the cauda equina within normal limits. No abnormal enhancement. Paraspinal and other soft tissues: Paraspinous soft tissues demonstrate no acute finding. Asymmetric right renal atrophy with hydroureteronephrosis, similar as compared to prior CT, and possibly chronic in nature. Chronic atrophy involving the right psoas muscle related to a right total hip arthroplasty noted. Disc levels: T12-L1 8 mm bony retropulsion related to the chronic L1 compression fracture. Retropulsed bone flattens and indents the ventral thecal sac. Mild bilateral facet hypertrophy. Minimal flattening and displacement of the distal conus without cord signal changes. Moderate spinal stenosis. Foramina remain patent. L1-2: Degenerative intervertebral disc space narrowing with disc bulge and disc desiccation. 8 mm retropulsed bone indents the ventral thecal sac. Mild facet and ligament flavum hypertrophy. Up to moderate spinal stenosis at the level of L1 due to bony retropulsion. Moderate bilateral L1 foraminal stenosis. L2-3: Disc desiccation with mild disc bulge, slightly asymmetric to the left. Mild facet and ligament flavum hypertrophy.  Associated trace joint effusions. No significant spinal stenosis. Moderate left L2 foraminal narrowing. Right neural foramina remains patent. L3-4: Disc desiccation  with diffuse disc bulge, slightly asymmetric to the left. Mild to moderate facet and ligament flavum hypertrophy. Mild prominence of the dorsal epidural fat. Resultant moderate spinal stenosis. Mild right with mild to moderate left L3 foraminal narrowing. L4-5: Disc desiccation with mild disc bulge. Moderate facet and ligament flavum hypertrophy. Prominence of the dorsal epidural fat. Resultant moderate spinal stenosis. Moderate bilateral L4 foraminal narrowing, slightly greater on the left. L5-S1: Disc desiccation with mild disc bulge. Bulging disc mildly indents the ventral thecal sac, closely approximating the descending S1 nerve roots without overt neural impingement or displacement. Mild bilateral facet arthrosis. 1 cm complex rim enhancing lesion posterior to the left L5-S1 facet likely reflects a synovial cyst (series 3, image 12). This is not in a position to cause neural impingement. No significant spinal stenosis. Foramina remain patent. IMPRESSION: 1. Severe chronic compression deformity of L1 with 8 mm bony retropulsion and focal kyphotic angulation at the thoracolumbar junction. Resultant moderate spinal stenosis at this level with mild flattening and displacement of the distal conus, but no cord signal changes. 2. Multifactorial degenerative changes at L3-4 and L4-5 with resultant moderate spinal stenosis, with mild to moderate bilateral L3 and L4 foraminal narrowing as above. 3. Moderate left L2 foraminal stenosis related to disc bulge and facet hypertrophy. 4. Atrophy of the right psoas muscle in the setting of underlying right total hip arthroplasty. Findings could contribute to right lower extremity weakness. 5. Asymmetric right renal atrophy with hydroureteronephrosis, similar as compared to prior CT, and possibly chronic in nature. Correlation with cross-sectional imaging of the abdomen and pelvis recommended as warranted. Electronically Signed   By: Morene Hoard M.D.   On: 08/10/2024  19:33   ECHOCARDIOGRAM COMPLETE Result Date: 08/10/2024    ECHOCARDIOGRAM REPORT   Patient Name:   COPE MARTE Date of Exam: 08/10/2024 Medical Rec #:  [id number]        Height:       76.0 in Accession #:    [id number]       Weight:       255.0 lb Date of Birth:  11/25/1945        BSA:          2.456 m Patient Age:    14 years         BP:           115/75 mmHg Patient Gender: M                HR:           95 bpm. Exam Location:  Inpatient Procedure: 2D Echo, Cardiac Doppler, Color Doppler and Intracardiac            Opacification Agent (Both Spectral and Color Flow Doppler were            utilized during procedure). Indications:    Stroke  History:        Patient has prior history of Echocardiogram examinations, most                 recent 05/23/2024. CAD, Abnormal ECG, TIA and Stroke,                 Arrythmias:Atrial Fibrillation; Risk Factors:Hypertension,                 Dyslipidemia and Sleep Apnea. Lyphoma.  Sonographer:  Ellouise Mose RDCS Referring Phys: [id number] JESSICA U [facility]  Sonographer Comments: Technically difficult study due to poor echo windows. Image acquisition challenging due to respiratory motion. Study delayed 15 mins to get patient in bed. Two assist with steady. Extremely difficult study. Patient attempted to turn. IMPRESSIONS  1. Left ventricular ejection fraction, by estimation, is 35 to 40%. The left ventricle has moderately decreased function. The left ventricle demonstrates global hypokinesis. The left ventricular internal cavity size was mildly dilated. Diastology indeterminate due to atrial fibrillation.  2. Right ventricular systolic function is mildly reduced. The right ventricular size is normal. Tricuspid regurgitation signal is inadequate for assessing PA pressure.  3. The mitral valve is normal in structure. Trivial mitral valve regurgitation. No evidence of mitral stenosis.  4. The aortic valve was not well visualized. Aortic valve regurgitation is not visualized. No aortic  stenosis is present.  5. The inferior vena cava is normal in size with greater than 50% respiratory variability, suggesting right atrial pressure of 3 mmHg. Comparison(s): Improvement in LV systolic function. Conclusion(s)/Recommendation(s): Improvement in LVEF to 35-40%. Very limited valvular assessment due to poor acoustic windows. Within this limitation, no notable valvular disease. FINDINGS  Left Ventricle: Left ventricular ejection fraction, by estimation, is 35 to 40%. The left ventricle has moderately decreased function. The left ventricle demonstrates global hypokinesis. Definity  contrast agent was given IV to delineate the left ventricular endocardial borders. The left ventricular internal cavity size was mildly dilated. There is no left ventricular hypertrophy. Diastology indeterminate due to atrial fibrillation. Right Ventricle: The right ventricular size is normal. No increase in right ventricular wall thickness. Right ventricular systolic function is mildly reduced. Tricuspid regurgitation signal is inadequate for assessing PA pressure. Left Atrium: Left atrial size was normal in size. Right Atrium: Right atrial size was normal in size. Pericardium: There is no evidence of pericardial effusion. Mitral Valve: The mitral valve is normal in structure. Trivial mitral valve regurgitation. No evidence of mitral valve stenosis. Tricuspid Valve: The tricuspid valve is normal in structure. Tricuspid valve regurgitation is not demonstrated. No evidence of tricuspid stenosis. Aortic Valve: The aortic valve was not well visualized. Aortic valve regurgitation is not visualized. No aortic stenosis is present. Pulmonic Valve: The pulmonic valve was normal in structure. Pulmonic valve regurgitation is not visualized. No evidence of pulmonic stenosis. Aorta: The aortic root was not well visualized and the ascending aorta was not well visualized. Venous: The inferior vena cava is normal in size with greater than 50%  respiratory variability, suggesting right atrial pressure of 3 mmHg. IAS/Shunts: No atrial level shunt detected by color flow Doppler.  LEFT VENTRICLE PLAX 2D LVIDd:         5.40 cm LVIDs:         4.80 cm LV PW:         1.30 cm LV IVS:        1.30 cm LVOT diam:     2.20 cm LV SV:         49 LV SV Index:   20 LVOT Area:     3.80 cm  LV Volumes (MOD) LV vol d, MOD A2C: 94.6 ml LV vol d, MOD A4C: 125.0 ml LV vol s, MOD A2C: 66.7 ml LV vol s, MOD A4C: 88.5 ml LV SV MOD A2C:     27.9 ml LV SV MOD A4C:     125.0 ml LV SV MOD BP:      28.0 ml RIGHT VENTRICLE  IVC RV S prime:     7.40 cm/s  IVC diam: 2.10 cm TAPSE (M-mode): 1.0 cm LEFT ATRIUM             Index        RIGHT ATRIUM           Index LA diam:        4.70 cm 1.91 cm/m   RA Area:     22.60 cm LA Vol (A2C):   56.9 ml 23.17 ml/m  RA Volume:   63.90 ml  26.02 ml/m LA Vol (A4C):   38.8 ml 15.80 ml/m LA Biplane Vol: 45.3 ml 18.44 ml/m  AORTIC VALVE LVOT Vmax:   89.50 cm/s LVOT Vmean:  59.300 cm/s LVOT VTI:    0.129 m  AORTA Ao Root diam: 3.90 cm MITRAL VALVE MV Area (PHT): 4.21 cm    SHUNTS MV Decel Time: 180 msec    Systemic VTI:  0.13 m MV E velocity: 78.10 cm/s  Systemic Diam: 2.20 cm Georganna Archer Electronically signed by Georganna Archer Signature Date/Time: 08/10/2024/2:57:06 PM    Final    MR BRAIN WO CONTRAST Result Date: 08/10/2024 CLINICAL DATA:  Initial evaluation for acute neuro deficit, stroke suspected. EXAM: MRI HEAD WITHOUT CONTRAST TECHNIQUE: Multiplanar, multiecho pulse sequences of the brain and surrounding structures were obtained without intravenous contrast. COMPARISON:  Comparison made with CT from 08/08/2024. FINDINGS: Brain: Examination mildly degraded by motion artifact. Diffuse prominence of the CSF containing spaces compatible generalized cerebral atrophy. Patchy and confluent T2/FLAIR hyperintensity involving the periventricular, deep, and subcortical white matter both cerebral hemispheres, nonspecific, but most  commonly related to chronic microvascular ischemic disease. Changes are mild for age. Small focus of encephalomalacia and gliosis involving the anterior left frontal lobe (series 6, image 22). Finding favored to be related to prior trauma, although could potentially be related to a remote infarct. No abnormal foci of restricted diffusion to suggest acute or subacute ischemia. Gray-white matter differentiation maintained. No acute intracranial hemorrhage. Single chronic microhemorrhage noted within the inferior right cerebellum. No mass lesion, midline shift or mass effect. No hydrocephalus or extra-axial fluid collection. Pituitary gland within normal limits. Vascular: Major intracranial vascular flow voids are maintained. Skull and upper cervical spine: Craniocervical junction within normal limits. Bone marrow signal intensity overall within normal limits. No scalp soft tissue abnormality. Sinuses/Orbits: Prior bilateral ocular lens replacement. Mild mucosal thickening present about the ethmoidal air cells. Paranasal sinuses are otherwise clear. No significant mastoid effusion. Other: Suspected postoperative changes at the right nasopharynx noted. IMPRESSION: 1. No acute intracranial abnormality. 2. Small focus of encephalomalacia and gliosis involving the anterior left frontal lobe, favored to be related to prior trauma, although could potentially be related to a remote infarct. 3. Underlying age-related cerebral atrophy with mild chronic small vessel ischemic disease. Electronically Signed   By: Morene Hoard M.D.   On: 08/10/2024 00:46     Assessment and Plan:  GLEB MCGUIRE is a 78 y.o. male with a hx of B-cell lymphoma, recurrent UTIs, OSA, CAD, atrial fibrillation not on anticoagulation, and systolic heart failure who is being seen 08/13/2024 for the evaluation of preoperative risk assessment at the request of Harlene Bowl DO.  Preoperative risk assessment TTE on 08/10/2024 showed an LVEF of  35 to 40%, global hypokinesis, trivial MR, and IVC with greater than 50% respiratory variability Denies any chest pain, worsening DOE, or worsening shortness of breath. Has a RCRI score of 3 this is an 11% chance of  a major cardiac event. Unable to do more than 4 metabolic equivalents. Is at an elevated cardiac risk. Patient understands this risk. If an urgent or emergent situation arises it is reasonable to proceed with surgery.   Atrial fibrillation Heart rate is controlled on telemetry Unable to tolerate anticoagulation due to hematuria Continue metoprolol  succinate 25 mg daily  CAD Hyperlipidemia Had a cardiac catheterization done on 06/2024 that showed 70 to 80% stenosis in the proximal LAD, and chronic total occlusion in the mid RCA.  Upper normal LV filling pressure(LVEDP 15), moderately elevated RV filling pressure (RA 12, RV 44/10 mmHg), mild to moderate pulmonary hypertension, and mildly reduced Fick CO/CI (CO 5.0 L/min, CI 2.1 L/min/m^2).  These findings were recommended to be managed medically.  The patient's heart failure was felt to be mixed ischemic and nonischemic. Continue aspirin  81 mg daily Continue atorvastatin  40 mg daily Continue Zetia  10 mg daily   Systolic heart failure Felt to be both ischemic and nonischemic.  Had cardiac catheterization as mentioned above. A cardiac MRI was done on 07/23/2024 and showed normal LV size with global hypokinesis and an LVEF of 35%, normal RV size with EF of 40%, and  Nonspecific mid-wall LGE at the basal inferior RV insertion site and in the basal septum. This pattern can be seen with pressure/volume overload. TTE on 08/10/2024 showed an LVEF of 35 to 40%, global hypokinesis, trivial MR, and IVC with greater than 50% respiratory variability Creatinine elevated at 1.9. GDMT -limited by CKD stage IIIb. Continue metoprolol  succinate 25 mg daily  Otherwise management per primary    Risk Assessment/Risk Scores:     New [place]  Heart  Association (NYHA) Functional Class NYHA Class II  CHA2DS2-VASc Score = 5   This indicates a 7.2% annual risk of stroke. The patient's score is based upon: CHF History: 1 HTN History: 1 Diabetes History: 0 Stroke History: 0 Vascular Disease History: 1 Age Score: 2 Gender Score: 0    [facility] will sign off.   The patient is ready for discharge today from a cardiac standpoint. Medication Recommendations:  none Other recommendations (labs, testing, etc):  none Follow up as an outpatient:  as needed.  For questions or updates, please contact [facility] Please consult [email or web address] for contact info under     Signed, Morse Clause, PA-C  08/13/2024 2:20 PM  Patient seen and examined, note reviewed with the signed Advanced Practice Provider. I personally reviewed laboratory data, imaging studies and relevant notes. I independently examined the patient and formulated the important aspects of the plan. I have personally discussed the plan with the patient and/or family. Comments or changes to the note/plan are indicated below.  Patient seen and examined his bedside.    Preoperative cardiac evaluation Paroxysmal atrial fibrillation he is rate controlled CAD-no angina symptoms Hyperlipidemia Chronic systolic heart failure currently euvolemic  Clinically he is stable from a cardiovascular standpoint.  No need for any medication changes at this time. We have been asked to see the patient today to discuss his status in terms of likely orthopedic surgery.  The patient does not have any unstable cardiac conditions.  Upon evaluation today, he can achieve 4 METs or greater without anginal symptoms.  According to [facility] and AHA guidelines, he requires no further cardiac workup prior to his noncardiac surgery and should be at acceptable risk.  Our service is available as necessary in the perioperative period.  Kardie Tobb DO, MS [facility] Attending Cardiologist [place]  [facility]  [address] [place], [place] [address] [phone] Website: [email or web address]

## 2024-08-13 NOTE — Progress Notes (Signed)
 Physical Therapy Treatment Patient Details Name: [name] MRN: [id number] DOB: 08/22/1946 Today's Date: 08/13/2024   History of Present Illness 78 y.o. male presents to [facility] 08/08/24 with acute R LE weakness and decreased sensation. CT head/MRI negative. 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 MRI lumbar spine showed severe chronic compression deformity of L1, atrophy of R psoas muscle PMHx: relapsed large B-cell lymphoma, coronary artery disease, recurrent UTIs, atrial fibrillation (not on blood thinner due to bleeding/hematuria), chronic systolic heart failure.    PT Comments  Pt making excellent progress towards his physical therapy goals this session. Initially performed transition to standing with R knee block and modA, but has difficulty with anterior weight shift with pre gait activities. No R knee buckle noted this session, so utilized RW with good success. Pt ambulating 50 ft with a RW and min-modA +2. Reports continued RLE weakness in comparison to baseline. Pt presents with weakness, impaired standing balance, abnormal posture. Patient will benefit from intensive inpatient follow-up therapy, >3 hours/day to address deficits, maximize functional mobility and decrease caregiver burden.   If plan is discharge home, recommend the following: A lot of help with walking and/or transfers;A lot of help with bathing/dressing/bathroom;Assistance with cooking/housework;Assist for transportation;Help with stairs or ramp for entrance   Can travel by private vehicle        Equipment Recommendations  None recommended by PT    Recommendations for Other Services       Precautions / Restrictions Precautions Precautions: Fall Recall of Precautions/Restrictions: Intact Restrictions Weight Bearing Restrictions Per Provider Order: No     Mobility  Bed Mobility Overal bed mobility: Needs Assistance Bed Mobility: Supine to Sit     Supine to sit: Supervision           Transfers Overall transfer level: Needs assistance Equipment used: Rolling walker (2 wheels), None Transfers: Sit to/from Stand Sit to Stand: Min assist, Mod assist, +2 safety/equipment           General transfer comment: ModA with no AD, difficulty obtaining anterior weight shift, improved to minA with RW    Ambulation/Gait Ambulation/Gait assistance: Min assist, Mod assist, +2 physical assistance Gait Distance (Feet): 50 Feet Assistive device: Rolling walker (2 wheels) Gait Pattern/deviations: Step-through pattern, Decreased stride length, Drifts right/left, Trunk flexed Gait velocity: decreased Gait velocity interpretation: <1.8 ft/sec, indicate of risk for recurrent falls   General Gait Details: Consistent verbal cueing for walker proximity, obstacle negotiation, keeping feet on inside of RW. Min-modA +2 for balance/safety. assist for steering walker   Stairs             Wheelchair Mobility     Tilt Bed    Modified Rankin (Stroke Patients Only) Modified Rankin (Stroke Patients Only) Pre-Morbid Rankin Score: No symptoms Modified Rankin: Moderately severe disability     Balance Overall balance assessment: Needs assistance, History of Falls Sitting-balance support: No upper extremity supported, Feet supported Sitting balance-Leahy Scale: Good     Standing balance support: Bilateral upper extremity supported, During functional activity, Reliant on assistive device for balance Standing balance-Leahy Scale: Poor                              Communication Communication Communication: Impaired Factors Affecting Communication: Hearing impaired  Cognition Arousal: Alert Behavior During Therapy: WFL for tasks assessed/performed   PT - Cognitive impairments: No apparent impairments  PT - Cognition Comments: Very talkative, needs redirection to focus on tasks Following commands: Intact      Cueing Cueing  Techniques: Verbal cues, Tactile cues, Visual cues  Exercises      General Comments        Pertinent Vitals/Pain Pain Assessment Pain Assessment: No/denies pain    Home Living                          Prior Function            PT Goals (current goals can now be found in the care plan section) Acute Rehab PT Goals Patient Stated Goal: to be more independent Potential to Achieve Goals: Good Progress towards PT goals: Progressing toward goals    Frequency    Min 3X/week      PT Plan      Co-evaluation              AM-PAC PT 6 Clicks Mobility   Outcome Measure  Help needed turning from your back to your side while in a flat bed without using bedrails?: None Help needed moving from lying on your back to sitting on the side of a flat bed without using bedrails?: A Little Help needed moving to and from a bed to a chair (including a wheelchair)?: A Lot Help needed standing up from a chair using your arms (e.g., wheelchair or bedside chair)?: A Lot Help needed to walk in hospital room?: A Lot Help needed climbing 3-5 steps with a railing? : Total 6 Click Score: 14    End of Session Equipment Utilized During Treatment: Gait belt Activity Tolerance: Patient tolerated treatment well Patient left: in chair;with call bell/phone within reach;with chair alarm set Nurse Communication: Mobility status;Other (comment) (check primo fit) PT Visit Diagnosis: Unsteadiness on feet (R26.81);Other abnormalities of gait and mobility (R26.89);Muscle weakness (generalized) (M62.81);History of falling (Z91.81)     Time: 8858-8790 PT Time Calculation (min) (ACUTE ONLY): 28 min  Charges:    $Gait Training: 8-22 mins $Therapeutic Activity: 8-22 mins PT General Charges $$ ACUTE PT VISIT: 1 Visit                     Aleck Daring, PT, DPT Acute Rehabilitation Services Office [phone]    Aleck ONEIDA Daring 08/13/2024, 12:38 PM

## 2024-08-13 NOTE — Progress Notes (Signed)
 PROGRESS NOTE    TYJAY GALINDO  FMW:[id number] DOB: 04-01-1946 DOA: 08/08/2024 PCP: Garald Karlynn GAILS, MD    Brief Narrative:  [name] is a 78 y.o. male with medical history significant of history of relapsed large B-cell lymphoma, coronary artery disease, recurrent UTIs, atrial fibrillation (not on blood thinner due to bleeding/hematuria), chronic systolic heart failure.  Patient was in his normal health on 10/29 when he attended a heart failure clinic appointment.  After the doctor appointment he had acute right leg weakness-he describes as unable to stand as it gives out.  His strength in bed remains intact.  He also had some loss of sensation that has started to resolve.  Patient denies any back pain, hip pain or knee pain.  He was seen by teleneurology at [facility] and they recommended MRI of the brain, Plavix loading dose and then daily Plavix as well as aspirin .  They also recommended permissive hypertension for 24 to 48 hours   Family has noticed a difference in gait and strength over the last month.    Assessment and Plan: Right LE weakness - Family reports several falls in the last few days -CT head/neck done -MRI brain without acute issues -MRI lumbar with multiple chronic issues -NS Dr. Gillie saw--no plan for urgent/emergent surgery as issues are chronic, will need outpatient  EMG by neurology prior to discussion on surgery.  Will get cardiology to see in the hospital in case surgery needs to be moved more urgently. -PT/OT eval- CIR  -no SLP as no issues with speech or swallowing -Hgba1c less than 7 -FLP pending (on lipitor  80)-LDL 14 -neurology consulted     Chronic AF - Rate controlled - Off AC due to bleeds and sees from chart review appears to be related to hematuria but also patient is a fall risk   CKD stage IIIA -baseline around 2   Hypothyroidism -on synthroid  -TSH is elevated, will need outpatient follow-up - Free T4 is normal    BPH -proscar/flomax    HTN -resume home meds -avoid hypotension   hx of relapsed large B-cell lymphoma   s/p R-CHOP 2019 - s/p RGDP 2023 - recently treated with Epcoritamab     Recurrent UTI -appears to be abx at bedtime for prophylaxis   Chronic systolic CHF -follows with HF clinic- Dr. KATHEE -05/2024 LVEF 25-30%  -GDT as able   OSA -does not tolerate CPAP   DVT prophylaxis: Place TED hose Start: 08/10/24 1734 enoxaparin  (LOVENOX ) injection 40 mg Start: 08/09/24 1800    Code Status: Full Code Family Communication: at bedside  Disposition Plan:  Level of care: Med-Surg    CIR?   Consultants:  Neurology Neurosurgery  Subjective: Needs to have a bowel movement  Objective: Vitals:   08/13/24 0010 08/13/24 0505 08/13/24 0808 08/13/24 1140  BP: 130/73 120/68 125/81 116/78  Pulse: 78 84 83 (!) 104  Resp: 18 18 20 18   Temp: 98.6 F (37 C) 97.9 F (36.6 C) 98 F (36.7 C) 98.6 F (37 C)  TempSrc: Oral Oral  Oral  SpO2: 91% 93% 93% 92%  Weight:      Height:        Intake/Output Summary (Last 24 hours) at 08/13/2024 1235 Last data filed at 08/13/2024 9376 Gross per 24 hour  Intake 480 ml  Output 1950 ml  Net -1470 ml   Filed Weights   08/08/24 1854  Weight: 115.7 kg    Examination:  In bed, no acute distress   Data  Reviewed: I have personally reviewed following labs and imaging studies  CBC: Recent Labs  Lab 08/08/24 1936 08/11/24 0943  WBC 5.9 7.7  NEUTROABS 3.8  --   HGB 15.7 16.7  HCT 47.6 49.5  MCV 102.1* 101.0*  PLT 142* 138*   Basic Metabolic Panel: Recent Labs  Lab 08/08/24 1049 08/08/24 1936 08/11/24 0943  NA 138 137 137  K 4.9 4.8 4.1  CL 95* 99 99  CO2 28 29 26   GLUCOSE 75 89 102*  BUN 32* 31* 27*  CREATININE 2.00* 2.15* 1.98*  CALCIUM  9.2 9.8 8.9   GFR: Estimated Creatinine Clearance: 42.8 mL/min (A) (by C-G formula based on SCr of 1.98 mg/dL (H)). Liver Function Tests: Recent Labs  Lab 08/08/24 1936  AST 32   ALT 24  ALKPHOS 74  BILITOT 0.7  PROT 6.0*  ALBUMIN  3.8   No results for input(s): LIPASE, AMYLASE in the last 168 hours. No results for input(s): AMMONIA in the last 168 hours. Coagulation Profile: Recent Labs  Lab 08/08/24 1936  INR 1.0   Cardiac Enzymes: Recent Labs  Lab 08/11/24 0513  CKTOTAL 58   BNP (last 3 results) No results for input(s): PROBNP in the last 8760 hours. HbA1C: No results for input(s): HGBA1C in the last 72 hours.  CBG: Recent Labs  Lab 08/08/24 1854 08/10/24 0636  GLUCAP 97 110*   Lipid Profile: No results for input(s): CHOL, HDL, LDLCALC, TRIG, CHOLHDL, LDLDIRECT in the last 72 hours.  Thyroid  Function Tests: Recent Labs    08/11/24 0513 08/11/24 1512 08/12/24 1133  TSH 31.742*  --   --   FREET4  --    < > 0.95   < > = values in this interval not displayed.   Anemia Panel: No results for input(s): VITAMINB12, FOLATE, FERRITIN, TIBC, IRON, RETICCTPCT in the last 72 hours.  Sepsis Labs: No results for input(s): PROCALCITON, LATICACIDVEN in the last 168 hours.  No results found for this or any previous visit (from the past 240 hours).       Radiology Studies: No results found.       Scheduled Meds:  acyclovir   400 mg Oral BID   allopurinol   100 mg Oral Daily   aspirin   81 mg Oral Daily   atorvastatin   40 mg Oral Daily   buPROPion   300 mg Oral Daily   cephALEXin   250 mg Oral QHS   DULoxetine   60 mg Oral BID   enoxaparin  (LOVENOX ) injection  40 mg Subcutaneous Q24H   ezetimibe   10 mg Oral QHS   finasteride  5 mg Oral Daily   levothyroxine   125 mcg Oral Q0600   metoprolol  succinate  25 mg Oral Daily   mirabegron  ER  25 mg Oral Daily   pantoprazole   40 mg Oral Daily   tamsulosin   0.4 mg Oral Daily   Continuous Infusions:     LOS: 1 day    Time spent: 45 minutes spent on chart review, discussion with nursing staff, consultants, updating family and interview/physical exam;  more than 50% of that time was spent in counseling and/or coordination of care.    Harlene RAYMOND Bowl, DO [facility] Available via Epic secure chat 7am-7pm After these hours, please refer to coverage provider listed on [email or web address] 08/13/2024, 12:35 PM

## 2024-08-14 DIAGNOSIS — R29898 Other symptoms and signs involving the musculoskeletal system: Secondary | ICD-10-CM | POA: Diagnosis not present

## 2024-08-14 LAB — CBC
HCT: 49 % (ref 39.0–52.0)
Hemoglobin: 16.7 g/dL (ref 13.0–17.0)
MCH: 34.1 pg — ABNORMAL HIGH (ref 26.0–34.0)
MCHC: 34.1 g/dL (ref 30.0–36.0)
MCV: 100 fL (ref 80.0–100.0)
Platelets: 144 K/uL — ABNORMAL LOW (ref 150–400)
RBC: 4.9 MIL/uL (ref 4.22–5.81)
RDW: 15.8 % — ABNORMAL HIGH (ref 11.5–15.5)
WBC: 8.3 K/uL (ref 4.0–10.5)
nRBC: 0 % (ref 0.0–0.2)

## 2024-08-14 LAB — BASIC METABOLIC PANEL WITH GFR
Anion gap: 11 (ref 5–15)
BUN: 33 mg/dL — ABNORMAL HIGH (ref 8–23)
CO2: 24 mmol/L (ref 22–32)
Calcium: 9 mg/dL (ref 8.9–10.3)
Chloride: 100 mmol/L (ref 98–111)
Creatinine, Ser: 1.96 mg/dL — ABNORMAL HIGH (ref 0.61–1.24)
GFR, Estimated: 34 mL/min — ABNORMAL LOW (ref >60)
Glucose, Bld: 100 mg/dL — ABNORMAL HIGH (ref 70–99)
Potassium: 4 mmol/L (ref 3.5–5.1)
Sodium: 135 mmol/L (ref 135–145)

## 2024-08-14 NOTE — Progress Notes (Signed)
Inpatient Rehab Admissions Coordinator:  Awaiting insurance authorization. Will continue to follow.   Gayland Curry, [place], [place] Admissions Coordinator [phone]

## 2024-08-14 NOTE — Progress Notes (Addendum)
 Physical Therapy Treatment Patient Details Name: [name] MRN: [id number] DOB: 02/05/1946 Today's Date: 08/14/2024   History of Present Illness 78 y.o. male presents to [facility] 08/08/24 with acute R LE weakness and decreased sensation. CT head/MRI negative. 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 MRI lumbar spine showed severe chronic compression deformity of L1, atrophy of R psoas muscle PMHx: relapsed large B-cell lymphoma, coronary artery disease, recurrent UTIs, atrial fibrillation (not on blood thinner due to bleeding/hematuria), chronic systolic heart failure.    PT Comments  Pt is motivated and progressing toward PT goals. Did not note R knee buckle during STS today, and able to maintain static balance with CGA once in standing. Performed pre-gait activities to improve weight shift and SL stability, which pt tolerated well. Able to ambulate 64' today with minA and VC for walker proximity. Subjectively, pt states adherence with performing bed level strengthening exercises and he feels he is getting stronger. Continue to recommend post-acute rehab >3hrs/day for improvement of gait and functional mobility. Acute PT to follow.    If plan is discharge home, recommend the following: A lot of help with walking and/or transfers;A lot of help with bathing/dressing/bathroom;Assistance with cooking/housework;Assist for transportation;Help with stairs or ramp for entrance   Can travel by private vehicle        Equipment Recommendations  None recommended by PT    Recommendations for Other Services       Precautions / Restrictions Precautions Precautions: Fall Recall of Precautions/Restrictions: Intact Restrictions Weight Bearing Restrictions Per Provider Order: No     Mobility  Bed Mobility Overal bed mobility: Needs Assistance Bed Mobility: Supine to Sit     Supine to sit: Supervision          Transfers Overall transfer level: Needs assistance Equipment  used: Rolling walker (2 wheels) Transfers: Sit to/from Stand Sit to Stand: +2 safety/equipment, Mod assist           General transfer comment: ModA +2 for safety and assist to boost up. Able to maintain balance once in standing today without physical assist.    Ambulation/Gait Ambulation/Gait assistance: Min assist, +2 safety/equipment Gait Distance (Feet): 66 Feet Assistive device: Rolling walker (2 wheels) Gait Pattern/deviations: Step-through pattern, Decreased stride length, Drifts right/left, Trunk flexed Gait velocity: decreased Gait velocity interpretation: <1.8 ft/sec, indicate of risk for recurrent falls Pre-gait activities: Weight shifts B x20 reps, +2 CGA. Standing marches B x15 reps, +2 CGA/minA. General Gait Details: Occasional VC for walker proximity today, as well as walker navigation during turns. +2 present for safety. Pt shaky with pre-gait, but no LOB noted throughout session.   Stairs             Wheelchair Mobility     Tilt Bed    Modified Rankin (Stroke Patients Only)       Balance Overall balance assessment: Needs assistance, History of Falls Sitting-balance support: No upper extremity supported, Feet supported Sitting balance-Leahy Scale: Good     Standing balance support: Bilateral upper extremity supported, During functional activity, Reliant on assistive device for balance Standing balance-Leahy Scale: Poor Standing balance comment: reliant on UE and external support                            Communication Communication Communication: Impaired Factors Affecting Communication: Hearing impaired  Cognition Arousal: Alert Behavior During Therapy: WFL for tasks assessed/performed   PT - Cognitive impairments: No apparent impairments  PT - Cognition Comments: Very talkative, needs redirection to focus on tasks Following commands: Intact      Cueing Cueing Techniques: Verbal cues, Gestural  cues  Exercises      General Comments General comments (skin integrity, edema, etc.): Did not note R knee buckle today during transfer or gait. Able to tolerate standing with B UE support at sink for pericare.      Pertinent Vitals/Pain Pain Assessment Pain Assessment: Faces Faces Pain Scale: No hurt    Home Living                          Prior Function            PT Goals (current goals can now be found in the care plan section) Acute Rehab PT Goals Patient Stated Goal: to be more independent PT Goal Formulation: With patient/family Time For Goal Achievement: 08/24/24 Potential to Achieve Goals: Good Progress towards PT goals: Progressing toward goals    Frequency    Min 3X/week      PT Plan      Co-evaluation              AM-PAC PT 6 Clicks Mobility   Outcome Measure  Help needed turning from your back to your side while in a flat bed without using bedrails?: None Help needed moving from lying on your back to sitting on the side of a flat bed without using bedrails?: A Little Help needed moving to and from a bed to a chair (including a wheelchair)?: A Lot Help needed standing up from a chair using your arms (e.g., wheelchair or bedside chair)?: A Lot Help needed to walk in hospital room?: A Lot Help needed climbing 3-5 steps with a railing? : Total 6 Click Score: 14    End of Session Equipment Utilized During Treatment: Gait belt Activity Tolerance: Patient tolerated treatment well Patient left: in chair;with call bell/phone within reach;with chair alarm set Nurse Communication: Mobility status PT Visit Diagnosis: Unsteadiness on feet (R26.81);Other abnormalities of gait and mobility (R26.89);Muscle weakness (generalized) (M62.81);History of falling (Z91.81)     Time: 8990-8967 PT Time Calculation (min) (ACUTE ONLY): 23 min  Charges:    $Gait Training: 8-22 mins $Therapeutic Activity: 8-22 mins PT General Charges $$ ACUTE PT  VISIT: 1 Visit                     Kashay Cavenaugh, SPT    Kamillah Didonato 08/14/2024, 11:50 AM

## 2024-08-14 NOTE — Progress Notes (Signed)
 PROGRESS NOTE    [name]  FMW:[id number] DOB: January 04, 1946 DOA: 08/08/2024 PCP: Garald Karlynn GAILS, MD    Brief Narrative:  [name] is a 78 y.o. male with medical history significant of history of relapsed large B-cell lymphoma, coronary artery disease, recurrent UTIs, atrial fibrillation (not on blood thinner due to bleeding/hematuria), chronic systolic heart failure.  Patient was in his normal health on 10/29 when he attended a heart failure clinic appointment.  After the doctor appointment he had acute right leg weakness-he describes as unable to stand as it gives out.  No stroke found on MRI.  Patient did have abnormal lumbar spine MRI-seen by Dr. Gillie (no note yet written but he reports to me that patient needs to be seen by cardiology for risk factor mitigation if surgery is ever needed).  Insurance authorization pending for CIR    Assessment and Plan: Right LE weakness-most likely due to -Family reports several falls -CT head/neck done -MRI brain without acute issues -MRI lumbar with multiple chronic issues -NS Dr. Cabbell saw--no plan for urgent/emergent surgery as issues are chronic, will need outpatient follow-up-EMG by neurology prior to discussion on surgery.  Will get cardiology to see in the hospital in case surgery needs to be moved more urgently. -PT/OT eval- CIR -insurance authorization pending -no SLP as no issues with speech or swallowing -Hgba1c less than 7 - (on lipitor  80)-LDL 14 -neurology consulted and has since signed off as MRI negative  Chronic AF - Rate controlled - Off AC due to bleeds and sees from chart review appears to be related to hematuria but also patient is a fall risk   CKD stage IIIA -baseline around 2   Hypothyroidism -on synthroid  -TSH is elevated, will need outpatient follow-up - Free T4 is normal   BPH -proscar/flomax    HTN -resume home meds   hx of relapsed large B-cell lymphoma   s/p R-CHOP 2019 - s/p  RGDP 2023 - recently treated with Epcoritamab     Recurrent UTI -appears to be abx at bedtime for prophylaxis   Chronic systolic CHF -follows with HF clinic- Dr. KATHEE -05/2024 LVEF 25-30%  -GDT as able   OSA -does not tolerate CPAP   DVT prophylaxis: Place TED hose Start: 08/10/24 1734 enoxaparin  (LOVENOX ) injection 40 mg Start: 08/09/24 1800    Code Status: Full Code Family Communication: at bedside  Disposition Plan:  Level of care: [employer] authorization for CIR pending   Consultants:  Neurology Neurosurgery  Subjective: Excited about CIR-moving better with PT  Objective: Vitals:   08/14/24 0032 08/14/24 0421 08/14/24 0757 08/14/24 1142  BP: 125/81 128/66 130/84 126/61  Pulse: 96 97 86 (!) 106  Resp: (!) 22 18 18 19   Temp: 97.8 F (36.6 C) 98.2 F (36.8 C) 97.9 F (36.6 C) 98 F (36.7 C)  TempSrc:   Oral Oral  SpO2: 94% 96% 95% 95%  Weight:      Height:        Intake/Output Summary (Last 24 hours) at 08/14/2024 1345 Last data filed at 08/14/2024 0421 Gross per 24 hour  Intake --  Output 1150 ml  Net -1150 ml   Filed Weights   08/08/24 1854  Weight: 115.7 kg    Examination:  In bed, no acute distress No increased work of breathing  Data Reviewed: I have personally reviewed following labs and imaging studies  CBC: Recent Labs  Lab 08/08/24 1936 08/11/24 0943 08/14/24 0303  WBC 5.9 7.7 8.3  NEUTROABS 3.8  --   --   HGB 15.7 16.7 16.7  HCT 47.6 49.5 49.0  MCV 102.1* 101.0* 100.0  PLT 142* 138* 144*   Basic Metabolic Panel: Recent Labs  Lab 08/08/24 1049 08/08/24 1936 08/11/24 0943 08/14/24 0303  NA 138 137 137 135  K 4.9 4.8 4.1 4.0  CL 95* 99 99 100  CO2 28 29 26 24   GLUCOSE 75 89 102* 100*  BUN 32* 31* 27* 33*  CREATININE 2.00* 2.15* 1.98* 1.96*  CALCIUM  9.2 9.8 8.9 9.0   GFR: Estimated Creatinine Clearance: 43.2 mL/min (A) (by C-G formula based on SCr of 1.96 mg/dL (H)). Liver Function Tests: Recent Labs   Lab 08/08/24 1936  AST 32  ALT 24  ALKPHOS 74  BILITOT 0.7  PROT 6.0*  ALBUMIN  3.8   No results for input(s): LIPASE, AMYLASE in the last 168 hours. No results for input(s): AMMONIA in the last 168 hours. Coagulation Profile: Recent Labs  Lab 08/08/24 1936  INR 1.0   Cardiac Enzymes: Recent Labs  Lab 08/11/24 0513  CKTOTAL 58   BNP (last 3 results) No results for input(s): PROBNP in the last 8760 hours. HbA1C: No results for input(s): HGBA1C in the last 72 hours.  CBG: Recent Labs  Lab 08/08/24 1854 08/10/24 0636  GLUCAP 97 110*   Lipid Profile: No results for input(s): CHOL, HDL, LDLCALC, TRIG, CHOLHDL, LDLDIRECT in the last 72 hours.  Thyroid  Function Tests: Recent Labs    08/12/24 1133  FREET4 0.95   Anemia Panel: No results for input(s): VITAMINB12, FOLATE, FERRITIN, TIBC, IRON, RETICCTPCT in the last 72 hours.  Sepsis Labs: No results for input(s): PROCALCITON, LATICACIDVEN in the last 168 hours.  No results found for this or any previous visit (from the past 240 hours).       Radiology Studies: No results found.       Scheduled Meds:  acyclovir   400 mg Oral BID   allopurinol   100 mg Oral Daily   aspirin   81 mg Oral Daily   atorvastatin   40 mg Oral Daily   buPROPion   300 mg Oral Daily   cephALEXin   250 mg Oral QHS   DULoxetine   60 mg Oral BID   enoxaparin  (LOVENOX ) injection  40 mg Subcutaneous Q24H   ezetimibe   10 mg Oral QHS   finasteride  5 mg Oral Daily   Gerhardt's butt cream   Topical Daily   levothyroxine   125 mcg Oral Q0600   metoprolol  succinate  25 mg Oral Daily   mirabegron  ER  25 mg Oral Daily   pantoprazole   40 mg Oral Daily   tamsulosin   0.4 mg Oral Daily   Continuous Infusions:     LOS: 2 days    Time spent: 45 minutes spent on chart review, discussion with nursing staff, consultants, updating family and interview/physical exam; more than 50% of that time was spent in  counseling and/or coordination of care.    Harlene Joseph Bowl, DO [facility] Available via Epic secure chat 7am-7pm After these hours, please refer to coverage provider listed on [email or web address] 08/14/2024, 1:45 PM

## 2024-08-15 DIAGNOSIS — R29898 Other symptoms and signs involving the musculoskeletal system: Secondary | ICD-10-CM | POA: Diagnosis not present

## 2024-08-15 MED ORDER — LOPERAMIDE HCL 2 MG PO CAPS
2.0000 mg | ORAL_CAPSULE | ORAL | Status: DC | PRN
Start: 2024-08-15 — End: 2024-08-21
  Administered 2024-08-15 – 2024-08-19 (×12): 2 mg via ORAL
  Filled 2024-08-15 (×13): qty 1

## 2024-08-15 NOTE — Progress Notes (Signed)
 [facility]  PROGRESS NOTE  [name] FMW:[id number] DOB: Jul 25, 1946 DOA: 08/08/2024 PCP: Garald Karlynn GAILS, MD   Brief HPI:    78 y.o. male with medical history significant of history of relapsed large B-cell lymphoma, coronary artery disease, recurrent UTIs, atrial fibrillation (not on blood thinner due to bleeding/hematuria), chronic systolic heart failure.  Patient was in his normal health on 10/29 when he attended a heart failure clinic appointment.  After the doctor appointment he had acute right leg weakness-he describes as unable to stand as it gives out.  No stroke found on MRI.  Patient did have abnormal lumbar spine MRI-seen by Dr. Gillie (no note yet written but he reports to me that patient needs to be seen by cardiology for risk factor mitigation if surgery is ever needed).  Insurance authorization pending for CIR     Assessment/Plan:    Right LE weakness -Patient came to hospital with several falls at home -CT head and neck was negative, MRI brain did not show acute abnormalities -MRI of lumbar spine showed severe chronic compression deformity of L1, multifactorial degenerative changes at L3-4, L4-5 with resultant moderate spinal stenosis -Neurology was consulted, recommended outpatient EMG for further evaluation of weakness -Dr. Juvenal discussed MRI report with Dr. Gillie, neurosurgeon; recommended outpatient evaluation, no emergent surgery recommended -PT OT eval -Recommended CIR however insurance company has denied CIR -Likely will go to short-term rehab at skilled facility  - Chronic atrial fibrillation Rate controlled - Off AC due to bleeds and sees from chart review appears to be related to hematuria but also patient is a fall risk   CKD stage IIIB -baseline around 2   Hypothyroidism -on synthroid  -TSH is elevated at 31.7, will need outpatient follow-up - Free T4 is normal   BPH -proscar/flomax    HTN -resume home meds   Hx of relapsed  large B-cell lymphoma   s/p R-CHOP 2019 - s/p RGDP 2023 - recently treated with Epcoritamab     Recurrent UTI - Take cephalexin  250 mg p.o. nightly chronically at home for UTI prevention   Chronic systolic CHF -follows with HF clinic- Dr. KATHEE -05/2024 LVEF 25-30%  -GDT as able   OSA -does not tolerate CPAP     DVT prophylaxis: Lovenox   Medications     acyclovir   400 mg Oral BID   allopurinol   100 mg Oral Daily   aspirin   81 mg Oral Daily   atorvastatin   40 mg Oral Daily   buPROPion   300 mg Oral Daily   cephALEXin   250 mg Oral QHS   DULoxetine   60 mg Oral BID   enoxaparin  (LOVENOX ) injection  40 mg Subcutaneous Q24H   ezetimibe   10 mg Oral QHS   finasteride  5 mg Oral Daily   Gerhardt's butt cream   Topical Daily   levothyroxine   125 mcg Oral Q0600   metoprolol  succinate  25 mg Oral Daily   mirabegron  ER  25 mg Oral Daily   pantoprazole   40 mg Oral Daily   tamsulosin   0.4 mg Oral Daily     Data Reviewed:   CBG:  Recent Labs  Lab 08/08/24 1854 08/10/24 0636  GLUCAP 97 110*    SpO2: 94 %    Vitals:   08/15/24 0344 08/15/24 0758 08/15/24 0758 08/15/24 0758  BP: 132/86 125/83 125/83 125/83  Pulse: 79 83 87 83  Resp: 17  18 18   Temp: 97.6 F (36.4 C) 97.8 F (36.6 C) 97.8 F (36.6 C) 97.8 F (  36.6 C)  TempSrc: Oral Oral Oral Oral  SpO2: 96% 94% 94% 94%  Weight:      Height:          Data Reviewed:  Basic Metabolic Panel: Recent Labs  Lab 08/08/24 1049 08/08/24 1936 08/11/24 0943 08/14/24 0303  NA 138 137 137 135  K 4.9 4.8 4.1 4.0  CL 95* 99 99 100  CO2 28 29 26 24   GLUCOSE 75 89 102* 100*  BUN 32* 31* 27* 33*  CREATININE 2.00* 2.15* 1.98* 1.96*  CALCIUM  9.2 9.8 8.9 9.0    CBC: Recent Labs  Lab 08/08/24 1936 08/11/24 0943 08/14/24 0303  WBC 5.9 7.7 8.3  NEUTROABS 3.8  --   --   HGB 15.7 16.7 16.7  HCT 47.6 49.5 49.0  MCV 102.1* 101.0* 100.0  PLT 142* 138* 144*    LFT Recent Labs  Lab 08/08/24 1936  AST 32  ALT 24   ALKPHOS 74  BILITOT 0.7  PROT 6.0*  ALBUMIN  3.8     Antibiotics: Anti-infectives (From admission, onward)    Start     Dose/Rate Route Frequency Ordered Stop   08/09/24 2200  cephALEXin  (KEFLEX ) capsule 250 mg        250 mg Oral Daily at bedtime 08/09/24 1718     08/09/24 2200  acyclovir  (ZOVIRAX ) tablet 400 mg        400 mg Oral 2 times daily 08/09/24 1718          CONSULTS neurology  Code Status: Full code  Family Communication: No family at bedside     Subjective   Denies any new complaints   Objective    Physical Examination:    General-appears in no acute distress Heart-S1-S2, regular, no murmur auscultated Lungs-clear to auscultation bilaterally, no wheezing or crackles auscultated Abdomen-soft, nontender, no organomegaly Extremities-no edema in the lower extremities Neuro-alert, oriented x3, no focal deficit noted  Status is: Inpatient:             [name]   [facility] If 7PM-7AM, please contact night-coverage at [email or web address], Office  [phone]   08/15/2024, 9:29 AM  LOS: 3 days

## 2024-08-15 NOTE — Progress Notes (Signed)
 Occupational Therapy Treatment Patient Details Name: [name] MRN: [id number] DOB: 03-05-1946 Today's Date: 08/15/2024   History of present illness 78 y.o. male presents to [facility] 08/08/24 with acute R LE weakness and decreased sensation. CT head/MRI negative. 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 MRI lumbar spine showed severe chronic compression deformity of L1, atrophy of R psoas muscle PMHx: relapsed large B-cell lymphoma, coronary artery disease, recurrent UTIs, atrial fibrillation (not on blood thinner due to bleeding/hematuria), chronic systolic heart failure.   OT comments  Patient seen in order to progress with functional activity tolerance and ADL management. Patient able to ambulate with mod A to sink, and complete grooming in standing (RUE braced on sink, with up to mod A to correct in standing). Patient continues to require cues for technique, pacing, and upright posture prior to advancing with functional mobility. Patient completing sit<>stands from recliner, with mod A for balance provided. HR ranging from 97 to 120 BPM with mobility. OT recommendation remains appropriate; will continue to follow.       If plan is discharge home, recommend the following:  Two people to help with walking and/or transfers;A lot of help with bathing/dressing/bathroom;Assistance with cooking/housework;Assist for transportation;Help with stairs or ramp for entrance   Equipment Recommendations  Other (comment) (defer to next venue)    Recommendations for Other Services      Precautions / Restrictions Precautions Precautions: Fall Recall of Precautions/Restrictions: Intact Restrictions Weight Bearing Restrictions Per Provider Order: No       Mobility Bed Mobility Overal bed mobility: Needs Assistance Bed Mobility: Supine to Sit       Sit to supine: Contact guard assist   General bed mobility comments: CGA for safety, cues for pacing as patient uses momentum to sit  up, increasing safety risk    Transfers Overall transfer level: Needs assistance Equipment used: Rolling walker (2 wheels), None Transfers: Sit to/from Stand Sit to Stand: Mod assist           General transfer comment: Mod A for sit<>stand from EOB, able to ambulate with mod A to sink, completing static standing with RUE braced on sink, mod A to ambulate to recliner and completing 3 additional sit<>stands from recliner, cues for posture, hand placement, feed placement throughout     Balance Overall balance assessment: Needs assistance, History of Falls Sitting-balance support: No upper extremity supported, Feet supported Sitting balance-Leahy Scale: Good     Standing balance support: Bilateral upper extremity supported, During functional activity, Reliant on assistive device for balance Standing balance-Leahy Scale: Poor Standing balance comment: reliant on UE and external support                           ADL either performed or assessed with clinical judgement   ADL Overall ADL's : Needs assistance/impaired     Grooming: Wash/dry hands;Wash/dry face;Oral care;Set up;Sitting                   Toilet Transfer: Moderate assistance;Ambulation;Regular Toilet;Rolling walker (2 wheels)           Functional mobility during ADLs: Moderate assistance;+2 for physical assistance;Rolling walker (2 wheels) General ADL Comments: Patient seen in order to progress with functional activity tolerance and ADL management. Patient able to ambulate with mod A to sink, and complete grooming in standing. Patient continues to require cues for technique and upright posture prior to advancing with functional mobility. Patient completing sit<>stands from recliner, with mod  A for balance provided. HR ranging from 97 to 120 BPM with mobility. OT recommendation remains appropriate; will continue to follow.    Extremity/Trunk Assessment              Acupuncturist Communication: Impaired Factors Affecting Communication: Hearing impaired   Cognition Arousal: Alert Behavior During Therapy: WFL for tasks assessed/performed Cognition: No apparent impairments                               Following commands: Intact        Cueing   Cueing Techniques: Verbal cues, Tactile cues, Visual cues  Exercises      Shoulder Instructions       General Comments HR 97 to 120 with movement    Pertinent Vitals/ Pain          Home Living                                          Prior Functioning/Environment              Frequency  Min 2X/week        Progress Toward Goals  OT Goals(current goals can now be found in the care plan section)  Progress towards OT goals: Progressing toward goals  Acute Rehab OT Goals Patient Stated Goal: to get to rehab OT Goal Formulation: With patient Time For Goal Achievement: 08/24/24 Potential to Achieve Goals: Good  Plan      Co-evaluation                 AM-PAC OT 6 Clicks Daily Activity     Outcome Measure   Help from another person eating meals?: None Help from another person taking care of personal grooming?: A Little Help from another person toileting, which includes using toliet, bedpan, or urinal?: A Lot Help from another person bathing (including washing, rinsing, drying)?: A Lot Help from another person to put on and taking off regular upper body clothing?: A Little Help from another person to put on and taking off regular lower body clothing?: A Little 6 Click Score: 17    End of Session Equipment Utilized During Treatment: Rolling walker (2 wheels);Gait belt  OT Visit Diagnosis: Unsteadiness on feet (R26.81);Repeated falls (R29.6);History of falling (Z91.81)   Activity Tolerance Patient tolerated treatment well   Patient Left with call bell/phone within reach;in chair;with chair alarm set;with  nursing/sitter in room   Nurse Communication Mobility status        Time: 9077-9041 OT Time Calculation (min): 36 min  Charges: OT General Charges $OT Visit: 1 Visit OT Treatments $Self Care/Home Management : 23-37 mins  Ronal Gift E. Netanya Yazdani, OTR/L Acute Rehabilitation Services [phone]   Ronal Gift Salt 08/15/2024, 12:01 PM

## 2024-08-15 NOTE — Care Management Important Message (Signed)
 Important Message  Patient Details  Name: [name] MRN: [id number] Date of Birth: [birth year]   Important Message Given:  Yes - Medicare IM     Claretta Deed 08/15/2024, 3:14 PM

## 2024-08-15 NOTE — Progress Notes (Addendum)
 Inpatient Rehab Admissions Coordinator:  Insurance is offering a audiological scientist. Dr. Drusilla made aware. Will continue to follow.  Insurance denied after peer-to-peer. Pt would like to pursue expedited appeal.    Tinnie Yvone Cohens, MS, CCC-SLP Admissions Coordinator [phone]

## 2024-08-16 DIAGNOSIS — R29898 Other symptoms and signs involving the musculoskeletal system: Secondary | ICD-10-CM | POA: Diagnosis not present

## 2024-08-16 LAB — CREATININE, SERUM
Creatinine, Ser: 2.08 mg/dL — ABNORMAL HIGH (ref 0.61–1.24)
GFR, Estimated: 32 mL/min — ABNORMAL LOW (ref >60)

## 2024-08-16 NOTE — Progress Notes (Addendum)
 Physical Therapy Treatment Patient Details Name: [name] MRN: [id number] DOB: [birth year] Today's Date: 08/16/2024   History of Present Illness 78 y.o. male presents to [facility] 08/08/24 with acute R LE weakness and decreased sensation. CT head/MRI negative. 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 MRI lumbar spine showed severe chronic compression deformity of L1, atrophy of R psoas muscle PMHx: relapsed large B-cell lymphoma, coronary artery disease, recurrent UTIs, atrial fibrillation (not on blood thinner due to bleeding/hematuria), chronic systolic heart failure.   PT Comments  Pt received in supine and eager for mobility. Pt continues to progress well towards acute PT goals. Able to tolerate short gait distances with seated rest breaks in between. MinA for slight steadying assist when turning with pt able to tolerate 3x5ft with RW. Also worked on challenging balance with backwards walking and turning. Continue to recommend >3hrs post acute rehab with acute PT to follow.   HR 115-138 BPM during gait   If plan is discharge home, recommend the following: A lot of help with walking and/or transfers;A lot of help with bathing/dressing/bathroom;Assistance with cooking/housework;Assist for transportation;Help with stairs or ramp for entrance   Can travel by private vehicle      Yes  Equipment Recommendations  None recommended by PT       Precautions / Restrictions Precautions Precautions: Fall Recall of Precautions/Restrictions: Intact Precaution/Restrictions Comments: watch HR Restrictions Weight Bearing Restrictions Per Provider Order: No     Mobility  Bed Mobility Overal bed mobility: Needs Assistance Bed Mobility: Supine to Sit    Sit to supine: Contact guard assist   General bed mobility comments: CGA for safety    Transfers Overall transfer level: Needs assistance Equipment used: Rolling walker (2 wheels) Transfers: Sit to/from Stand Sit to Stand:  Min assist    General transfer comment: MinA for slight steadying assist    Ambulation/Gait Ambulation/Gait assistance: Min assist Gait Distance (Feet): 40 Feet (x3) Assistive device: Rolling walker (2 wheels) Gait Pattern/deviations: Step-through pattern, Decreased stride length, Trunk flexed Gait velocity: decreased     General Gait Details: forward flexed trunk with cueing for RW proximity    Modified Rankin (Stroke Patients Only) Modified Rankin (Stroke Patients Only) Pre-Morbid Rankin Score: No symptoms Modified Rankin: Moderately severe disability     Balance Overall balance assessment: Needs assistance, History of Falls Sitting-balance support: No upper extremity supported, Feet supported Sitting balance-Leahy Scale: Good     Standing balance support: Bilateral upper extremity supported, During functional activity, Reliant on assistive device for balance Standing balance-Leahy Scale: Poor Standing balance comment: reliant on UE and external support    High level balance activites: Backward walking, Turns High Level Balance Comments: x2 forwards/backwards 3x 6 ft,            Communication Communication Communication: Impaired Factors Affecting Communication: Hearing impaired  Cognition Arousal: Alert Behavior During Therapy: WFL for tasks assessed/performed   PT - Cognitive impairments: No apparent impairments    Following commands: Intact      Cueing Cueing Techniques: Verbal cues, Tactile cues         Pertinent Vitals/Pain Pain Assessment Pain Assessment: No/denies pain     PT Goals (current goals can now be found in the care plan section) Acute Rehab PT Goals PT Goal Formulation: With patient/family Time For Goal Achievement: 08/24/24 Potential to Achieve Goals: Good Progress towards PT goals: Progressing toward goals    Frequency    Min 3X/week       AM-PAC PT 6 Clicks  Mobility   Outcome Measure  Help needed turning from your  back to your side while in a flat bed without using bedrails?: None Help needed moving from lying on your back to sitting on the side of a flat bed without using bedrails?: A Little Help needed moving to and from a bed to a chair (including a wheelchair)?: A Little Help needed standing up from a chair using your arms (e.g., wheelchair or bedside chair)?: A Little Help needed to walk in hospital room?: A Little Help needed climbing 3-5 steps with a railing? : Total 6 Click Score: 17    End of Session Equipment Utilized During Treatment: Gait belt Activity Tolerance: Patient tolerated treatment well Patient left: in bed;with call bell/phone within reach;with bed alarm set Nurse Communication: Mobility status;Other (comment) (HR) PT Visit Diagnosis: Unsteadiness on feet (R26.81);Other abnormalities of gait and mobility (R26.89);Muscle weakness (generalized) (M62.81);History of falling (Z91.81)     Time: 8563-8483 PT Time Calculation (min) (ACUTE ONLY): 40 min  Charges:    $Gait Training: 23-37 mins $Therapeutic Activity: 8-22 mins PT General Charges $$ ACUTE PT VISIT: 1 Visit                    Kate ORN, PT, DPT Secure Chat Preferred  Rehab Office [phone]   Kate BRAVO [name] 08/16/2024, 4:39 PM

## 2024-08-16 NOTE — Progress Notes (Signed)
 [facility]  PROGRESS NOTE  [name] FMW:[id number] DOB: 1946-08-04 DOA: 08/08/2024 PCP: Garald Karlynn GAILS, MD   Brief HPI:    78 y.o. male with medical history significant of history of relapsed large B-cell lymphoma, coronary artery disease, recurrent UTIs, atrial fibrillation (not on blood thinner due to bleeding/hematuria), chronic systolic heart failure.  Patient was in his normal health on 10/29 when he attended a heart failure clinic appointment.  After the doctor appointment he had acute right leg weakness-he describes as unable to stand as it gives out.  No stroke found on MRI.  Patient did have abnormal lumbar spine MRI-seen by Dr. Gillie (no note yet written but he reports to me that patient needs to be seen by cardiology for risk factor mitigation if surgery is ever needed).  Insurance authorization pending for CIR     Assessment/Plan:    Right LE weakness -Patient came to hospital with several falls at home -CT head and neck was negative, MRI brain did not show acute abnormalities -MRI of lumbar spine showed severe chronic compression deformity of L1, multifactorial degenerative changes at L3-4, L4-5 with resultant moderate spinal stenosis -Neurology was consulted, recommended outpatient EMG for further evaluation of weakness -Dr. Juvenal discussed MRI report with Dr. Gillie, neurosurgeon; recommended outpatient evaluation, no emergent surgery recommended -PT OT eval -Recommended CIR however insurance company has denied CIR - Patient has appealed the decision.  Awaiting outcome of the appeal   Chronic atrial fibrillation Rate controlled - Off AC due to bleeds and sees from chart review appears to be related to hematuria but also patient is a fall risk   CKD stage IIIB -baseline around 2 -Stable   Hypothyroidism -on synthroid  -TSH is elevated at 31.7, will need outpatient follow-up - Free T4 is normal   BPH -proscar/flomax    HTN -resume home meds    Hx of relapsed large B-cell lymphoma   s/p R-CHOP 2019 - s/p RGDP 2023 - recently treated with Epcoritamab     Recurrent UTI - Takes cephalexin  250 mg p.o. nightly chronically at home for UTI prevention   Chronic systolic CHF -follows with HF clinic- Dr. KATHEE -05/2024 LVEF 25-30%  -GDT as able   OSA -does not tolerate CPAP     DVT prophylaxis: Lovenox   Medications     acyclovir   400 mg Oral BID   allopurinol   100 mg Oral Daily   aspirin   81 mg Oral Daily   atorvastatin   40 mg Oral Daily   buPROPion   300 mg Oral Daily   cephALEXin   250 mg Oral QHS   DULoxetine   60 mg Oral BID   enoxaparin  (LOVENOX ) injection  40 mg Subcutaneous Q24H   ezetimibe   10 mg Oral QHS   finasteride  5 mg Oral Daily   Gerhardt's butt cream   Topical Daily   levothyroxine   125 mcg Oral Q0600   metoprolol  succinate  25 mg Oral Daily   mirabegron  ER  25 mg Oral Daily   pantoprazole   40 mg Oral Daily   tamsulosin   0.4 mg Oral Daily     Data Reviewed:   CBG:  Recent Labs  Lab 08/10/24 0636  GLUCAP 110*    SpO2: 100 %    Vitals:   08/16/24 0339 08/16/24 0758 08/16/24 1039 08/16/24 1137  BP: (!) 146/70 135/83 130/79 132/79  Pulse: 89 91  (!) 53  Resp: 17 18  20   Temp: 97.9 F (36.6 C) 97.6 F (36.4 C)  98.2 F (  36.8 C)  TempSrc: Oral Oral  Oral  SpO2: 94% 96%  100%  Weight:      Height:          Data Reviewed:  Basic Metabolic Panel: Recent Labs  Lab 08/11/24 0943 08/14/24 0303 08/16/24 0525  NA 137 135  --   K 4.1 4.0  --   CL 99 100  --   CO2 26 24  --   GLUCOSE 102* 100*  --   BUN 27* 33*  --   CREATININE 1.98* 1.96* 2.08*  CALCIUM  8.9 9.0  --     CBC: Recent Labs  Lab 08/11/24 0943 08/14/24 0303  WBC 7.7 8.3  HGB 16.7 16.7  HCT 49.5 49.0  MCV 101.0* 100.0  PLT 138* 144*    LFT No results for input(s): AST, ALT, ALKPHOS, BILITOT, PROT, ALBUMIN  in the last 168 hours.    Antibiotics: Anti-infectives (From admission, onward)    Start      Dose/Rate Route Frequency Ordered Stop   08/09/24 2200  cephALEXin  (KEFLEX ) capsule 250 mg        250 mg Oral Daily at bedtime 08/09/24 1718     08/09/24 2200  acyclovir  (ZOVIRAX ) tablet 400 mg        400 mg Oral 2 times daily 08/09/24 1718          CONSULTS neurology  Code Status: Full code  Family Communication: No family at bedside     Subjective   Patient seen and examined.  No new complaints.  Insurance company has denied rehab at [facility].  Patient has appealed the decision by insurance company.   Objective    Physical Examination:   General-appears in no acute distress Heart-S1-S2, regular, no murmur auscultated Lungs-clear to auscultation bilaterally, no wheezing or crackles auscultated Abdomen-soft, nontender, no organomegaly Extremities-no edema in the lower extremities Neuro-alert, oriented x3, no focal deficit noted   Status is: Inpatient:          [name]   [facility] If 7PM-7AM, please contact night-coverage at [email or web address], Office  [phone]   08/16/2024, 12:39 PM  LOS: 4 days

## 2024-08-17 DIAGNOSIS — R29898 Other symptoms and signs involving the musculoskeletal system: Secondary | ICD-10-CM | POA: Diagnosis not present

## 2024-08-17 NOTE — Plan of Care (Signed)
   Problem: Clinical Measurements: Goal: Ability to maintain clinical measurements within normal limits will improve Outcome: Progressing Goal: Will remain free from infection Outcome: Progressing Goal: Diagnostic test results will improve Outcome: Progressing Goal: Respiratory complications will improve Outcome: Progressing Goal: Cardiovascular complication will be avoided Outcome: Progressing   Problem: Activity: Goal: Risk for activity intolerance will decrease Outcome: Progressing   Problem: Nutrition: Goal: Adequate nutrition will be maintained Outcome: Progressing   Problem: Elimination: Goal: Will not experience complications related to bowel motility Outcome: Progressing Goal: Will not experience complications related to urinary retention Outcome: Progressing   Problem: Pain Managment: Goal: General experience of comfort will improve and/or be controlled Outcome: Progressing   Problem: Safety: Goal: Ability to remain free from injury will improve Outcome: Progressing   Problem: Skin Integrity: Goal: Risk for impaired skin integrity will decrease Outcome: Progressing

## 2024-08-17 NOTE — Plan of Care (Signed)
  Problem: Education: Goal: Knowledge of General Education information will improve Description: Including pain rating scale, medication(s)/side effects and non-pharmacologic comfort measures 08/17/2024 0639 by Gaetana Randall Mathew GORMAN, RN Outcome: Progressing 08/17/2024 0504 by Gaetana Randall Mathew GORMAN, RN Outcome: Progressing   Problem: Coping: Goal: Level of anxiety will decrease 08/17/2024 0639 by Gaetana Randall Mathew GORMAN, RN Outcome: Progressing 08/17/2024 0504 by Gaetana Randall Mathew GORMAN, RN Outcome: Progressing   Problem: Education: Goal: Knowledge of disease or condition will improve 08/17/2024 0639 by Gaetana Randall Mathew GORMAN, RN Outcome: Progressing 08/17/2024 0504 by Gaetana Randall Mathew GORMAN, RN Outcome: Progressing Goal: Knowledge of secondary prevention will improve (MUST DOCUMENT ALL) 08/17/2024 0639 by Gaetana Randall Mathew GORMAN, RN Outcome: Progressing 08/17/2024 0504 by Gaetana Randall Mathew GORMAN, RN Outcome: Progressing Goal: Knowledge of patient specific risk factors will improve (DELETE if not current risk factor) 08/17/2024 0639 by Gaetana Randall Mathew GORMAN, RN Outcome: Progressing 08/17/2024 0504 by Gaetana Randall Mathew GORMAN, RN Outcome: Progressing   Problem: Ischemic Stroke/TIA Tissue Perfusion: Goal: Complications of ischemic stroke/TIA will be minimized 08/17/2024 0639 by Gaetana Randall Mathew GORMAN, RN Outcome: Progressing 08/17/2024 0504 by Gaetana Randall Mathew GORMAN, RN Outcome: Progressing   Problem: Health Behavior/Discharge Planning: Goal: Ability to manage health-related needs will improve 08/17/2024 0639 by Gaetana Randall Mathew GORMAN, RN Outcome: Progressing 08/17/2024 0504 by Gaetana Randall Mathew GORMAN, RN Outcome: Progressing Goal: Goals will be collaboratively established with patient/family 08/17/2024 [phone] by Gaetana Randall Mathew GORMAN, RN Outcome: Progressing 08/17/2024 0504 by Gaetana Randall Mathew GORMAN, RN Outcome: Progressing

## 2024-08-17 NOTE — Progress Notes (Signed)
 IP rehab admissions - We do not have a decision from insurance regarding the expedited appeal.  Will follow up with all once we hear back from insurance case manager.  [phone]

## 2024-08-17 NOTE — Progress Notes (Signed)
 Physical Therapy Treatment Patient Details Name: [name] MRN: [id number] DOB: 1946-07-02 Today's Date: 08/17/2024   History of Present Illness 78 y.o. male presents to [facility] 08/08/24 with acute R LE weakness and decreased sensation. CT head/MRI negative. 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 MRI lumbar spine showed severe chronic compression deformity of L1, atrophy of R psoas muscle PMHx: relapsed large B-cell lymphoma, coronary artery disease, recurrent UTIs, atrial fibrillation (not on blood thinner due to bleeding/hematuria), chronic systolic heart failure.   PT Comments  Pt greeted in supine and agreeable to PT session. Worked on obstacle negotiation, dynamic balance, and gait training in today's session. Pt required ModA to MinA throughout the session to address balance deficits and to boost-up when standing. Pt needed frequent seated rest breaks in between activities lasting about 1-2 minutes. Pt improved in today's session by increasing gait distance to 77ft at a time with MinA and RW. Pt also improved by being able to verbalize when he needed to take a rest break. Of note, pt had increased R LE instability with x2 episodes of knee buckling after ~20 minutes of activity. Pt continues to be an excellent candidate for >3hrs post acute rehab. Acute PT to follow.   110 BPM-130 BPM with activity      If plan is discharge home, recommend the following: A lot of help with walking and/or transfers;A lot of help with bathing/dressing/bathroom;Assistance with cooking/housework;Assist for transportation;Help with stairs or ramp for entrance   Can travel by private vehicle      Yes  Equipment Recommendations  None recommended by PT       Precautions / Restrictions Precautions Precautions: Fall Recall of Precautions/Restrictions: Intact Precaution/Restrictions Comments: watch HR Restrictions Weight Bearing Restrictions Per Provider Order: No     Mobility  Bed  Mobility Overal bed mobility: Needs Assistance Bed Mobility: Supine to Sit, Sit to Supine    Supine to sit: Supervision Sit to supine: Supervision      Transfers Overall transfer level: Needs assistance Equipment used: Rolling walker (2 wheels) Transfers: Sit to/from Stand Sit to Stand: Mod assist, Min assist    General transfer comment: ModA from lower EOB height with assist to steady. MinA from chair with assist to boost-up. Intermittently needing cues for hand placement prior to sitting    Ambulation/Gait Ambulation/Gait assistance: Min assist Gait Distance (Feet): 60 Feet Assistive device: Rolling walker (2 wheels) Gait Pattern/deviations: Step-through pattern, Decreased stride length, Trunk flexed Gait velocity: decr     General Gait Details: R LE instability with x2 instances of buckling after ~20 min of activity. Cues for RW proximity throughout     Balance Overall balance assessment: Needs assistance, History of Falls Sitting-balance support: No upper extremity supported, Feet supported Sitting balance-Leahy Scale: Good     Standing balance support: Bilateral upper extremity supported, During functional activity, Reliant on assistive device for balance Standing balance-Leahy Scale: Poor Standing balance comment: reliant on UE and external support    High level balance activites: Direction changes, Turns, Braiding High Level Balance Comments: Bx10 cone taps with UE support, 2x16 ft stepping over obstacles with each leg leading, x16 ft weaving around obstacles            Communication Communication Communication: Impaired Factors Affecting Communication: Hearing impaired  Cognition Arousal: Alert Behavior During Therapy: WFL for tasks assessed/performed   PT - Cognitive impairments: No apparent impairments    PT - Cognition Comments: Needs redirection to focus on task Following commands: Intact  Cueing Cueing Techniques: Verbal cues, Tactile cues,  Visual cues         Pertinent Vitals/Pain Pain Assessment Pain Assessment: No/denies pain     PT Goals (current goals can now be found in the care plan section) Acute Rehab PT Goals PT Goal Formulation: With patient/family Time For Goal Achievement: 08/24/24 Potential to Achieve Goals: Good Progress towards PT goals: Progressing toward goals    Frequency    Min 3X/week       AM-PAC PT 6 Clicks Mobility   Outcome Measure  Help needed turning from your back to your side while in a flat bed without using bedrails?: None Help needed moving from lying on your back to sitting on the side of a flat bed without using bedrails?: A Little Help needed moving to and from a bed to a chair (including a wheelchair)?: A Lot Help needed standing up from a chair using your arms (e.g., wheelchair or bedside chair)?: A Lot Help needed to walk in hospital room?: A Little Help needed climbing 3-5 steps with a railing? : Total 6 Click Score: 15    End of Session Equipment Utilized During Treatment: Gait belt Activity Tolerance: Patient tolerated treatment well Patient left: in bed;with call bell/phone within reach;with bed alarm set;with nursing/sitter in room Nurse Communication: Mobility status PT Visit Diagnosis: Unsteadiness on feet (R26.81);Other abnormalities of gait and mobility (R26.89);Muscle weakness (generalized) (M62.81);History of falling (Z91.81)     Time: 8457-8392 PT Time Calculation (min) (ACUTE ONLY): 25 min  Charges:    $Gait Training: 8-22 mins $Neuromuscular Re-education: 8-22 mins PT General Charges $$ ACUTE PT VISIT: 1 Visit                    Kate ORN, PT, DPT Secure Chat Preferred  Rehab Office [phone]   Kate BRAVO [name] 08/17/2024, 4:16 PM

## 2024-08-17 NOTE — Progress Notes (Signed)
 [facility]  PROGRESS NOTE  [name] FMW:[id number] DOB: 1946/09/13 DOA: 08/08/2024 PCP: Garald Karlynn GAILS, MD   Brief HPI:    78 y.o. male with medical history significant of history of relapsed large B-cell lymphoma, coronary artery disease, recurrent UTIs, atrial fibrillation (not on blood thinner due to bleeding/hematuria), chronic systolic heart failure.  Patient was in his normal health on 10/29 when he attended a heart failure clinic appointment.  After the doctor appointment he had acute right leg weakness-he describes as unable to stand as it gives out.  No stroke found on MRI.  Patient did have abnormal lumbar spine MRI-seen by Dr. Gillie (no note yet written but he reports to me that patient needs to be seen by cardiology for risk factor mitigation if surgery is ever needed).  Insurance authorization pending for CIR     Assessment/Plan:    Right LE weakness -Improving, working with PT in the hospital -Patient came to hospital with several falls at home -CT head and neck was negative, MRI brain did not show acute abnormalities -MRI of lumbar spine showed severe chronic compression deformity of L1, multifactorial degenerative changes at L3-4, L4-5 with resultant moderate spinal stenosis -Neurology was consulted, recommended outpatient EMG for further evaluation of weakness -Dr. Juvenal discussed MRI report with Dr. Gillie, neurosurgeon; recommended outpatient evaluation, no emergent surgery recommended -PT OT eval -Recommended CIR however insurance company has denied CIR - Patient has appealed the decision.  Awaiting outcome of the appeal   Chronic atrial fibrillation Rate controlled - Off AC due to bleeds and sees from chart review appears to be related to hematuria but also patient is a fall risk   CKD stage IIIB -baseline around 2 -Stable   Hypothyroidism -on synthroid  -TSH is elevated at 31.7, will need outpatient follow-up - Free T4 is normal    BPH -proscar/flomax    HTN -resume home meds   Hx of relapsed large B-cell lymphoma   s/p R-CHOP 2019 - s/p RGDP 2023 - recently treated with Epcoritamab     Recurrent UTI - Takes cephalexin  250 mg p.o. nightly chronically at home for UTI prevention   Chronic systolic CHF -follows with HF clinic- Dr. KATHEE -05/2024 LVEF 25-30%  -GDT as able   OSA -does not tolerate CPAP     DVT prophylaxis: Lovenox   Medications     acyclovir   400 mg Oral BID   allopurinol   100 mg Oral Daily   aspirin   81 mg Oral Daily   atorvastatin   40 mg Oral Daily   buPROPion   300 mg Oral Daily   cephALEXin   250 mg Oral QHS   DULoxetine   60 mg Oral BID   enoxaparin  (LOVENOX ) injection  40 mg Subcutaneous Q24H   ezetimibe   10 mg Oral QHS   finasteride  5 mg Oral Daily   Gerhardt's butt cream   Topical Daily   levothyroxine   125 mcg Oral Q0600   metoprolol  succinate  25 mg Oral Daily   mirabegron  ER  25 mg Oral Daily   pantoprazole   40 mg Oral Daily   tamsulosin   0.4 mg Oral Daily     Data Reviewed:   CBG:  No results for input(s): GLUCAP in the last 168 hours.   SpO2: 99 %    Vitals:   08/16/24 1957 08/16/24 2347 08/17/24 0442 08/17/24 0803  BP: 90/71 124/72 107/65 135/76  Pulse: 90 97 73 94  Resp: 19 17 17 18   Temp:  98.1 F (36.7 C) (!)  97.4 F (36.3 C)   TempSrc:  Oral Oral   SpO2: 97% 96% 95% 99%  Weight:      Height:          Data Reviewed:  Basic Metabolic Panel: Recent Labs  Lab 08/11/24 0943 08/14/24 0303 08/16/24 0525  NA 137 135  --   K 4.1 4.0  --   CL 99 100  --   CO2 26 24  --   GLUCOSE 102* 100*  --   BUN 27* 33*  --   CREATININE 1.98* 1.96* 2.08*  CALCIUM  8.9 9.0  --     CBC: Recent Labs  Lab 08/11/24 0943 08/14/24 0303  WBC 7.7 8.3  HGB 16.7 16.7  HCT 49.5 49.0  MCV 101.0* 100.0  PLT 138* 144*    LFT No results for input(s): AST, ALT, ALKPHOS, BILITOT, PROT, ALBUMIN  in the last 168 hours.     Antibiotics: Anti-infectives (From admission, onward)    Start     Dose/Rate Route Frequency Ordered Stop   08/09/24 2200  cephALEXin  (KEFLEX ) capsule 250 mg        250 mg Oral Daily at bedtime 08/09/24 1718     08/09/24 2200  acyclovir  (ZOVIRAX ) tablet 400 mg        400 mg Oral 2 times daily 08/09/24 1718          CONSULTS neurology  Code Status: Full code  Family Communication: No family at bedside     Subjective   Leg weakness is improving.  Has been ambulating in the hallway with PT   Objective    Physical Examination:  General-appears in no acute distress Heart-S1-S2, regular, no murmur auscultated Lungs-clear to auscultation bilaterally, no wheezing or crackles auscultated Abdomen-soft, nontender, no organomegaly Extremities-no edema in the lower extremities Neuro-alert, oriented x3, no focal deficit noted    Status is: Inpatient:          [name]   [facility] If 7PM-7AM, please contact night-coverage at [email or web address], Office  [phone]   08/17/2024, 9:43 AM  LOS: 5 days

## 2024-08-18 DIAGNOSIS — R29898 Other symptoms and signs involving the musculoskeletal system: Secondary | ICD-10-CM | POA: Diagnosis not present

## 2024-08-18 LAB — URINALYSIS, ROUTINE W REFLEX MICROSCOPIC
Bilirubin Urine: NEGATIVE
Glucose, UA: NEGATIVE mg/dL
Ketones, ur: NEGATIVE mg/dL
Nitrite: NEGATIVE
Protein, ur: 30 mg/dL — AB
Specific Gravity, Urine: 1.012 (ref 1.005–1.030)
pH: 7 (ref 5.0–8.0)

## 2024-08-18 MED ORDER — HYDROCORTISONE 1 % EX CREA
TOPICAL_CREAM | Freq: Two times a day (BID) | CUTANEOUS | Status: DC
  Administered 2024-08-18 – 2024-08-20 (×2): 1 via TOPICAL
  Filled 2024-08-18: qty 28

## 2024-08-18 NOTE — Plan of Care (Signed)
  Problem: Clinical Measurements: Goal: Ability to maintain clinical measurements within normal limits will improve Outcome: Progressing Goal: Will remain free from infection Outcome: Progressing Goal: Diagnostic test results will improve Outcome: Progressing Goal: Respiratory complications will improve Outcome: Progressing Goal: Cardiovascular complication will be avoided Outcome: Progressing   Problem: Activity: Goal: Risk for activity intolerance will decrease Outcome: Progressing   Problem: Nutrition: Goal: Adequate nutrition will be maintained Outcome: Progressing   Problem: Elimination: Goal: Will not experience complications related to bowel motility Outcome: Progressing Goal: Will not experience complications related to urinary retention Outcome: Progressing   Problem: Coping: Goal: Level of anxiety will decrease Outcome: Progressing   Problem: Pain Managment: Goal: General experience of comfort will improve and/or be controlled Outcome: Progressing   Problem: Safety: Goal: Ability to remain free from injury will improve Outcome: Progressing   Problem: Skin Integrity: Goal: Risk for impaired skin integrity will decrease Outcome: Progressing

## 2024-08-18 NOTE — Progress Notes (Addendum)
 [facility]  PROGRESS NOTE  [name] FMW:[id number] DOB: 1946-06-27 DOA: 08/08/2024 PCP: Garald Karlynn GAILS, MD   Brief HPI:    78 y.o. male with medical history significant of history of relapsed large B-cell lymphoma, coronary artery disease, recurrent UTIs, atrial fibrillation (not on blood thinner due to bleeding/hematuria), chronic systolic heart failure.  Patient was in his normal health on 10/29 when he attended a heart failure clinic appointment.  After the doctor appointment he had acute right leg weakness-he describes as unable to stand as it gives out.  No stroke found on MRI.  Patient did have abnormal lumbar spine MRI-seen by Dr. Gillie (no note yet written but he reports to me that patient needs to be seen by cardiology for risk factor mitigation if surgery is ever needed).  Insurance authorization pending for CIR     Assessment/Plan:    Right LE weakness -Improving, working with PT in the hospital -Patient came to hospital with several falls at home -CT head and neck was negative, MRI brain did not show acute abnormalities -MRI of lumbar spine showed severe chronic compression deformity of L1, multifactorial degenerative changes at L3-4, L4-5 with resultant moderate spinal stenosis -Neurology was consulted, recommended outpatient EMG for further evaluation of weakness -Dr. Juvenal discussed MRI report with Dr. Gillie, neurosurgeon; recommended outpatient evaluation, no emergent surgery recommended -PT OT eval -Recommended CIR however insurance company has denied CIR - Patient has appealed the decision.  Awaiting outcome of the appeal   Chronic atrial fibrillation Rate controlled - Off AC due to bleeds and sees from chart review appears to be related to hematuria but also patient is a fall risk   CKD stage IIIB -baseline around 2 -Stable   Hypothyroidism -on synthroid  -TSH is elevated at 31.7, will need outpatient follow-up - Free T4 is normal    BPH -proscar/flomax    HTN -resume home meds   Hx of relapsed large B-cell lymphoma   s/p R-CHOP 2019 - s/p RGDP 2023 - recently treated with Epcoritamab     Recurrent UTI - Takes cephalexin  250 mg p.o. nightly chronically at home for UTI prevention -UA obtained today shows large leukocytes, rare bacteria, 21-50 RBCs per high-power field -Patient has history of recurrent UTI and is chronically on cephalexin  -Continue cephalexin . -He is asymptomatic at this time, will continue to monitor   Chronic systolic CHF -follows with HF clinic- Dr. KATHEE -05/2024 LVEF 25-30%  -GDT as able   OSA -does not tolerate CPAP  Hyperlipidemia - Patient is on every 2 weeks Repatha shots as outpatient - Advised patient to get his own medication as [facility] cannot provide this medication in the hospital.  Osteoporosis - Patient is on testosterone  injection every 2 weeks - Advised patient to get the medication from home     DVT prophylaxis: Lovenox   Medications     acyclovir   400 mg Oral BID   allopurinol   100 mg Oral Daily   aspirin   81 mg Oral Daily   atorvastatin   40 mg Oral Daily   buPROPion   300 mg Oral Daily   cephALEXin   250 mg Oral QHS   DULoxetine   60 mg Oral BID   enoxaparin  (LOVENOX ) injection  40 mg Subcutaneous Q24H   ezetimibe   10 mg Oral QHS   finasteride  5 mg Oral Daily   Gerhardt's butt cream   Topical Daily   hydrocortisone cream   Topical BID   levothyroxine   125 mcg Oral Q0600   metoprolol   succinate  25 mg Oral Daily   mirabegron  ER  25 mg Oral Daily   pantoprazole   40 mg Oral Daily   tamsulosin   0.4 mg Oral Daily     Data Reviewed:   CBG:  No results for input(s): GLUCAP in the last 168 hours.   SpO2: 95 %    Vitals:   08/17/24 1519 08/17/24 2022 08/18/24 0433 08/18/24 0738  BP: 116/67 122/67 110/85 112/74  Pulse: 90 94 77 93  Resp: 18 20 19 16   Temp: 97.9 F (36.6 C) 97.6 F (36.4 C) 97.8 F (36.6 C) 98 F (36.7 C)  TempSrc: Oral  Oral Oral Oral  SpO2: 95% 96% 94% 95%  Weight:      Height:          Data Reviewed:  Basic Metabolic Panel: Recent Labs  Lab 08/14/24 0303 08/16/24 0525  NA 135  --   K 4.0  --   CL 100  --   CO2 24  --   GLUCOSE 100*  --   BUN 33*  --   CREATININE 1.96* 2.08*  CALCIUM  9.0  --     CBC: Recent Labs  Lab 08/14/24 0303  WBC 8.3  HGB 16.7  HCT 49.0  MCV 100.0  PLT 144*    LFT No results for input(s): AST, ALT, ALKPHOS, BILITOT, PROT, ALBUMIN  in the last 168 hours.    Antibiotics: Anti-infectives (From admission, onward)    Start     Dose/Rate Route Frequency Ordered Stop   08/09/24 2200  cephALEXin  (KEFLEX ) capsule 250 mg        250 mg Oral Daily at bedtime 08/09/24 1718     08/09/24 2200  acyclovir  (ZOVIRAX ) tablet 400 mg        400 mg Oral 2 times daily 08/09/24 1718          CONSULTS neurology  Code Status: Full code  Family Communication: No family at bedside     Subjective   Denies any complaints.   Objective    Physical Examination:  General-appears in no acute distress Heart-S1-S2, regular, no murmur auscultated Lungs-clear to auscultation bilaterally, no wheezing or crackles auscultated Abdomen-soft, nontender, no organomegaly Extremities-no edema in the lower extremities Neuro-alert, oriented x3, no focal deficit noted   Status is: Inpatient:          [name]   [facility] If 7PM-7AM, please contact night-coverage at [email or web address], Office  [phone]   08/18/2024, 10:20 AM  LOS: 6 days

## 2024-08-19 DIAGNOSIS — R29898 Other symptoms and signs involving the musculoskeletal system: Secondary | ICD-10-CM | POA: Diagnosis not present

## 2024-08-19 MED ORDER — CHLORHEXIDINE GLUCONATE 0.12 % MT SOLN
OROMUCOSAL | Status: AC
  Filled 2024-08-19: qty 15

## 2024-08-19 NOTE — Plan of Care (Signed)
  Problem: Education: Goal: Knowledge of General Education information will improve Description: Including pain rating scale, medication(s)/side effects and non-pharmacologic comfort measures Outcome: Progressing   Problem: Health Behavior/Discharge Planning: Goal: Ability to manage health-related needs will improve Outcome: Progressing   Problem: Clinical Measurements: Goal: Ability to maintain clinical measurements within normal limits will improve Outcome: Progressing Goal: Will remain free from infection Outcome: Progressing Goal: Diagnostic test results will improve Outcome: Progressing Goal: Respiratory complications will improve Outcome: Progressing Goal: Cardiovascular complication will be avoided Outcome: Progressing   Problem: Activity: Goal: Risk for activity intolerance will decrease Outcome: Progressing   Problem: Nutrition: Goal: Adequate nutrition will be maintained Outcome: Progressing   Problem: Coping: Goal: Level of anxiety will decrease Outcome: Progressing   Problem: Elimination: Goal: Will not experience complications related to bowel motility Outcome: Progressing Goal: Will not experience complications related to urinary retention Outcome: Progressing   Problem: Pain Managment: Goal: General experience of comfort will improve and/or be controlled Outcome: Progressing   Problem: Safety: Goal: Ability to remain free from injury will improve Outcome: Progressing   Problem: Skin Integrity: Goal: Risk for impaired skin integrity will decrease Outcome: Progressing   Problem: Education: Goal: Knowledge of disease or condition will improve Outcome: Progressing Goal: Knowledge of secondary prevention will improve (MUST DOCUMENT ALL) Outcome: Progressing Goal: Knowledge of patient specific risk factors will improve (DELETE if not current risk factor) Outcome: Progressing   Problem: Ischemic Stroke/TIA Tissue Perfusion: Goal: Complications of  ischemic stroke/TIA will be minimized Outcome: Progressing   Problem: Coping: Goal: Will verbalize positive feelings about self Outcome: Progressing Goal: Will identify appropriate support needs Outcome: Progressing   Problem: Health Behavior/Discharge Planning: Goal: Ability to manage health-related needs will improve Outcome: Progressing Goal: Goals will be collaboratively established with patient/family Outcome: Progressing   Problem: Self-Care: Goal: Ability to participate in self-care as condition permits will improve Outcome: Progressing Goal: Verbalization of feelings and concerns over difficulty with self-care will improve Outcome: Progressing Goal: Ability to communicate needs accurately will improve Outcome: Progressing   Problem: Nutrition: Goal: Risk of aspiration will decrease Outcome: Progressing Goal: Dietary intake will improve Outcome: Progressing

## 2024-08-19 NOTE — Progress Notes (Signed)
 [facility]  PROGRESS NOTE  [name] FMW:[id number] DOB: Mar 25, 1946 DOA: 08/08/2024 PCP: Joseph Karlynn GAILS, MD   Brief HPI:    78 y.o. male with medical history significant of history of relapsed large B-cell lymphoma, coronary artery disease, recurrent UTIs, atrial fibrillation (not on blood thinner due to bleeding/hematuria), chronic systolic heart failure.  Patient was in his normal health on 10/29 when he attended a heart failure clinic appointment.  After the doctor appointment he had acute right leg weakness-he describes as unable to stand as it gives out.  No stroke found on MRI.  Patient did have abnormal lumbar spine MRI-seen by [name] (no note yet written but he reports to me that patient needs to be seen by cardiology for risk factor mitigation if surgery is ever needed).  Insurance authorization pending for CIR     Assessment/Plan:    Right LE weakness -Improving, working with PT in the hospital -Patient came to hospital with several falls at home -CT head and neck was negative, MRI brain did not show acute abnormalities -MRI of lumbar spine showed severe chronic compression deformity of L1, multifactorial degenerative changes at L3-4, L4-5 with resultant moderate spinal stenosis -Neurology was consulted, recommended outpatient EMG for further evaluation of weakness -[name] discussed MRI report with [name], neurosurgeon; recommended outpatient evaluation, no emergent surgery recommended -PT OT eval -Recommended CIR however insurance company has denied CIR - Patient has appealed the decision.  Awaiting outcome of the appeal   Chronic atrial fibrillation Rate controlled - Off AC due to bleeds and sees from chart review appears to be related to hematuria but also patient is a fall risk   CKD stage IIIB -baseline around 2 -Stable   Hypothyroidism -on synthroid  -TSH is elevated at 31.7, will need outpatient follow-up - Free T4 is normal    BPH -proscar/flomax    HTN -resume home meds   Hx of relapsed large B-cell lymphoma   s/p R-CHOP 2019 - s/p RGDP 2023 - recently treated with Epcoritamab     Recurrent UTI - Takes cephalexin  250 mg p.o. nightly chronically at home for UTI prevention -UA obtained today shows large leukocytes, rare bacteria, 21-50 RBCs per high-power field -Patient has history of recurrent UTI and is chronically on cephalexin  -Continue cephalexin . -He is asymptomatic at this time, will continue to monitor   Chronic systolic CHF -follows with HF clinic- [name] -05/2024 LVEF 25-30%  -GDT as able   OSA -does not tolerate CPAP  Hyperlipidemia - Patient is on every 2 weeks Repatha shots as outpatient - Advised patient to get his own medication as [facility] cannot provide this medication in the hospital.  Osteoporosis - Patient is on testosterone  injection every 2 weeks - Advised patient to get the medication from home     DVT prophylaxis: Lovenox   Medications     acyclovir   400 mg Oral BID   allopurinol   100 mg Oral Daily   aspirin   81 mg Oral Daily   atorvastatin   40 mg Oral Daily   buPROPion   300 mg Oral Daily   cephALEXin   250 mg Oral QHS   DULoxetine   60 mg Oral BID   enoxaparin  (LOVENOX ) injection  40 mg Subcutaneous Q24H   ezetimibe   10 mg Oral QHS   finasteride  5 mg Oral Daily   Gerhardt's butt cream   Topical Daily   hydrocortisone cream   Topical BID   levothyroxine   125 mcg Oral Q0600   metoprolol   succinate  25 mg Oral Daily   mirabegron  ER  25 mg Oral Daily   pantoprazole   40 mg Oral Daily   tamsulosin   0.4 mg Oral Daily     Data Reviewed:   CBG:  No results for input(s): GLUCAP in the last 168 hours.   SpO2: 98 %    Vitals:   08/18/24 2053 08/19/24 0040 08/19/24 0453 08/19/24 0816  BP: 115/70 106/64 (!) 95/44 103/62  Pulse: 72 88 81 82  Resp: 19 17 17 19   Temp: 97.7 F (36.5 C) (!) 97.5 F (36.4 C) (!) 97.4 F (36.3 C) (!) 97.5 F (36.4  C)  TempSrc: Oral Oral Oral Oral  SpO2: 95% 94% 96% 98%  Weight:      Height:          Data Reviewed:  Basic Metabolic Panel: Recent Labs  Lab 08/14/24 0303 08/16/24 0525  NA 135  --   K 4.0  --   CL 100  --   CO2 24  --   GLUCOSE 100*  --   BUN 33*  --   CREATININE 1.96* 2.08*  CALCIUM  9.0  --     CBC: Recent Labs  Lab 08/14/24 0303  WBC 8.3  HGB 16.7  HCT 49.0  MCV 100.0  PLT 144*    LFT No results for input(s): AST, ALT, ALKPHOS, BILITOT, PROT, ALBUMIN  in the last 168 hours.    Antibiotics: Anti-infectives (From admission, onward)    Start     Dose/Rate Route Frequency Ordered Stop   08/09/24 2200  cephALEXin  (KEFLEX ) capsule 250 mg        250 mg Oral Daily at bedtime 08/09/24 1718     08/09/24 2200  acyclovir  (ZOVIRAX ) tablet 400 mg        400 mg Oral 2 times daily 08/09/24 1718          CONSULTS neurology  Code Status: Full code  Family Communication: No family at bedside     Subjective   Patient seen and examined.  Ambulated well yesterday in the hallway with physical therapy.  No dysuria today.   Objective    Physical Examination:  Appears in no acute distress S1-S2, regular, no murmur auscultated Abdomen soft, nontender Extremities no edema  Status is: Inpatient:          [name]   [facility] If 7PM-7AM, please contact night-coverage at [email or web address], Office  [phone]   08/19/2024, 9:14 AM  LOS: 7 days

## 2024-08-19 NOTE — Plan of Care (Signed)
  Problem: Education: Goal: Knowledge of General Education information will improve Description: Including pain rating scale, medication(s)/side effects and non-pharmacologic comfort measures Outcome: Not Progressing   Problem: Health Behavior/Discharge Planning: Goal: Ability to manage health-related needs will improve Outcome: Not Progressing   Problem: Clinical Measurements: Goal: Ability to maintain clinical measurements within normal limits will improve Outcome: Not Progressing Goal: Will remain free from infection Outcome: Not Progressing Goal: Diagnostic test results will improve Outcome: Not Progressing Goal: Respiratory complications will improve Outcome: Not Progressing Goal: Cardiovascular complication will be avoided Outcome: Not Progressing   Problem: Activity: Goal: Risk for activity intolerance will decrease Outcome: Not Progressing   Problem: Nutrition: Goal: Adequate nutrition will be maintained Outcome: Not Progressing   Problem: Coping: Goal: Level of anxiety will decrease Outcome: Not Progressing   Problem: Elimination: Goal: Will not experience complications related to bowel motility Outcome: Not Progressing Goal: Will not experience complications related to urinary retention Outcome: Not Progressing   Problem: Pain Managment: Goal: General experience of comfort will improve and/or be controlled Outcome: Not Progressing

## 2024-08-20 DIAGNOSIS — R29898 Other symptoms and signs involving the musculoskeletal system: Secondary | ICD-10-CM | POA: Diagnosis not present

## 2024-08-20 NOTE — Progress Notes (Signed)
 Physical Therapy Treatment Patient Details Name: [name] MRN: [id number] DOB: March 22, 1946 Today's Date: 08/20/2024   History of Present Illness 78 y.o. male presents to [facility] 08/08/24 with acute R LE weakness and decreased sensation. CT head/MRI negative. 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 MRI lumbar spine showed severe chronic compression deformity of L1, atrophy of R psoas muscle PMHx: relapsed large B-cell lymphoma, coronary artery disease, recurrent UTIs, atrial fibrillation (not on blood thinner due to bleeding/hematuria), chronic systolic heart failure.    PT Comments  Pt received in supine and agreeable to session. Pt able to tolerate increased gait distance and stair trial with up to min A and 2 seated rest breaks due to fatigue. Pt able to perform BLE step ups with increased instability noted with LLE. Pt continues to be limited by impaired activity tolerance and weakness. Pt continues to benefit from PT services to progress toward functional mobility goals.     If plan is discharge home, recommend the following: A lot of help with walking and/or transfers;A lot of help with bathing/dressing/bathroom;Assistance with cooking/housework;Assist for transportation;Help with stairs or ramp for entrance   Can travel by private vehicle        Equipment Recommendations  None recommended by PT    Recommendations for Other Services       Precautions / Restrictions Precautions Precautions: Fall Recall of Precautions/Restrictions: Intact Precaution/Restrictions Comments: watch HR Restrictions Weight Bearing Restrictions Per Provider Order: No     Mobility  Bed Mobility Overal bed mobility: Needs Assistance Bed Mobility: Supine to Sit     Supine to sit: Supervision, HOB elevated          Transfers Overall transfer level: Needs assistance Equipment used: Rolling walker (2 wheels) Transfers: Sit to/from Stand Sit to Stand: Min assist            General transfer comment: Min A for anterior weight shift with reliance on BUE support and momentum. Cues for increased anterior weight shift    Ambulation/Gait Ambulation/Gait assistance: Contact guard assist Gait Distance (Feet): 100 Feet (x2) Assistive device: Rolling walker (2 wheels) Gait Pattern/deviations: Step-through pattern, Decreased stride length, Trunk flexed Gait velocity: decr     General Gait Details: Cues for upright posture and RW proximity. Pt demonstrates decreased R knee flexion during swing through and 1 LOB requiring min A to correct. heavy reliance on RW support   Stairs Stairs: Yes Stairs assistance: Contact guard assist Stair Management: Two rails Number of Stairs: 2 (x2) General stair comments: Cues for sequencing and CGA for safety   Wheelchair Mobility     Tilt Bed    Modified Rankin (Stroke Patients Only) Modified Rankin (Stroke Patients Only) Pre-Morbid Rankin Score: No symptoms Modified Rankin: Moderately severe disability     Balance Overall balance assessment: Needs assistance, History of Falls Sitting-balance support: No upper extremity supported, Feet supported Sitting balance-Leahy Scale: Good Sitting balance - Comments: sitting EOB   Standing balance support: Bilateral upper extremity supported, During functional activity, Reliant on assistive device for balance Standing balance-Leahy Scale: Poor Standing balance comment: reliant on UE support                            Communication Communication Communication: Impaired Factors Affecting Communication: Hearing impaired  Cognition Arousal: Alert Behavior During Therapy: WFL for tasks assessed/performed   PT - Cognitive impairments: No apparent impairments  Following commands: Intact      Cueing Cueing Techniques: Verbal cues, Tactile cues, Visual cues  Exercises Other Exercises Other Exercises: BLE step ups x10     General Comments        Pertinent Vitals/Pain Pain Assessment Pain Assessment: No/denies pain     PT Goals (current goals can now be found in the care plan section) Acute Rehab PT Goals Patient Stated Goal: to be more independent PT Goal Formulation: With patient/family Time For Goal Achievement: 08/24/24 Progress towards PT goals: Progressing toward goals    Frequency    Min 3X/week       AM-PAC PT 6 Clicks Mobility   Outcome Measure  Help needed turning from your back to your side while in a flat bed without using bedrails?: None Help needed moving from lying on your back to sitting on the side of a flat bed without using bedrails?: A Little Help needed moving to and from a bed to a chair (including a wheelchair)?: A Little Help needed standing up from a chair using your arms (e.g., wheelchair or bedside chair)?: A Little Help needed to walk in hospital room?: A Little Help needed climbing 3-5 steps with a railing? : A Little 6 Click Score: 19    End of Session Equipment Utilized During Treatment: Gait belt Activity Tolerance: Patient tolerated treatment well Patient left: in chair;with chair alarm set;with call bell/phone within reach Nurse Communication: Mobility status PT Visit Diagnosis: Unsteadiness on feet (R26.81);Other abnormalities of gait and mobility (R26.89);Muscle weakness (generalized) (M62.81);History of falling (Z91.81)     Time: 9145-9059 PT Time Calculation (min) (ACUTE ONLY): 46 min  Charges:    $Gait Training: 8-22 mins $Therapeutic Exercise: 8-22 mins $Therapeutic Activity: 8-22 mins PT General Charges $$ ACUTE PT VISIT: 1 Visit                     Darryle George, PTA Acute Rehabilitation Services Secure Chat Preferred  [phone]    Darryle George 08/20/2024, 10:37 AM

## 2024-08-20 NOTE — Progress Notes (Signed)
 [facility]  PROGRESS NOTE  [name] FMW:[id number] DOB: Jul 09, 1946 DOA: 08/08/2024 PCP: Garald Karlynn GAILS, MD   Brief HPI:    78 y.o. male with medical history significant of history of relapsed large B-cell lymphoma, coronary artery disease, recurrent UTIs, atrial fibrillation (not on blood thinner due to bleeding/hematuria), chronic systolic heart failure.  Patient was in his normal health on 10/29 when he attended a heart failure clinic appointment.  After the doctor appointment he had acute right leg weakness-he describes as unable to stand as it gives out.  No stroke found on MRI.  Patient did have abnormal lumbar spine MRI-seen by Dr. Gillie (no note yet written but he reports to me that patient needs to be seen by cardiology for risk factor mitigation if surgery is ever needed).  Insurance authorization pending for CIR     Assessment/Plan:    Right LE weakness -Improving, working with PT in the hospital -Patient came to hospital with several falls at home -CT head and neck was negative, MRI brain did not show acute abnormalities -MRI of lumbar spine showed severe chronic compression deformity of L1, multifactorial degenerative changes at L3-4, L4-5 with resultant moderate spinal stenosis -Neurology was consulted, recommended outpatient EMG for further evaluation of weakness -Dr. Juvenal discussed MRI report with Dr. Gillie, neurosurgeon; recommended outpatient evaluation, no emergent surgery recommended -PT OT eval -Recommended CIR however insurance company has denied CIR - Patient has appealed the decision.  Awaiting outcome of the appeal   Chronic atrial fibrillation Rate controlled - Off AC due to bleeds and sees from chart review appears to be related to hematuria but also patient is a fall risk   CKD stage IIIB -baseline around 2 -Stable   Hypothyroidism -on synthroid  -TSH is elevated at 31.7, will need outpatient follow-up - Free T4 is normal    BPH -proscar/flomax    HTN -resume home meds   Hx of relapsed large B-cell lymphoma   s/p R-CHOP 2019 - s/p RGDP 2023 - recently treated with Epcoritamab     Recurrent UTI - Takes cephalexin  250 mg p.o. nightly chronically at home for UTI prevention -UA obtained today shows large leukocytes, rare bacteria, 21-50 RBCs per high-power field -Patient has history of recurrent UTI and is chronically on cephalexin  -Continue cephalexin . -He is asymptomatic at this time, will continue to monitor   Chronic systolic CHF -follows with HF clinic- Dr. KATHEE -05/2024 LVEF 25-30%  -GDT as able   OSA -does not tolerate CPAP  Hyperlipidemia - Patient is on every 2 weeks Repatha shots as outpatient - Advised patient to get his own medication as [facility] cannot provide this medication in the hospital.  Osteoporosis - Patient is on testosterone  injection every 2 weeks - Advised patient to get the medication from home     DVT prophylaxis: Lovenox   Medications     acyclovir   400 mg Oral BID   allopurinol   100 mg Oral Daily   aspirin   81 mg Oral Daily   atorvastatin   40 mg Oral Daily   buPROPion   300 mg Oral Daily   cephALEXin   250 mg Oral QHS   DULoxetine   60 mg Oral BID   enoxaparin  (LOVENOX ) injection  40 mg Subcutaneous Q24H   ezetimibe   10 mg Oral QHS   finasteride  5 mg Oral Daily   Gerhardt's butt cream   Topical Daily   hydrocortisone cream   Topical BID   levothyroxine   125 mcg Oral Q0600   metoprolol   succinate  25 mg Oral Daily   mirabegron  ER  25 mg Oral Daily   pantoprazole   40 mg Oral Daily   tamsulosin   0.4 mg Oral Daily     Data Reviewed:   CBG:  No results for input(s): GLUCAP in the last 168 hours.   SpO2: 100 %    Vitals:   08/19/24 2010 08/20/24 0020 08/20/24 0411 08/20/24 0757  BP: 106/87 121/87 121/71 108/82  Pulse: 87 95 (!) 45 82  Resp: 20   17  Temp: (!) 97.3 F (36.3 C) 97.7 F (36.5 C) (!) 97.3 F (36.3 C) 97.7 F (36.5 C)   TempSrc: Oral Oral Oral Oral  SpO2:  93% 98% 100%  Weight:      Height:          Data Reviewed:  Basic Metabolic Panel: Recent Labs  Lab 08/14/24 0303 08/16/24 0525  NA 135  --   K 4.0  --   CL 100  --   CO2 24  --   GLUCOSE 100*  --   BUN 33*  --   CREATININE 1.96* 2.08*  CALCIUM  9.0  --     CBC: Recent Labs  Lab 08/14/24 0303  WBC 8.3  HGB 16.7  HCT 49.0  MCV 100.0  PLT 144*    LFT No results for input(s): AST, ALT, ALKPHOS, BILITOT, PROT, ALBUMIN  in the last 168 hours.    Antibiotics: Anti-infectives (From admission, onward)    Start     Dose/Rate Route Frequency Ordered Stop   08/09/24 2200  cephALEXin  (KEFLEX ) capsule 250 mg        250 mg Oral Daily at bedtime 08/09/24 1718     08/09/24 2200  acyclovir  (ZOVIRAX ) tablet 400 mg        400 mg Oral 2 times daily 08/09/24 1718          CONSULTS neurology  Code Status: Full code  Family Communication: No family at bedside     Subjective   Patient seen and examined.  Pain well-controlled.  Working with PT  Objective    Physical Examination:  General-appears in no acute distress Heart-S1-S2, regular, no murmur auscultated Lungs-clear to auscultation bilaterally, no wheezing or crackles auscultated Abdomen-soft, nontender, no organomegaly Extremities-no edema in the lower extremities Neuro-alert, oriented x3, no focal deficit noted Status is: Inpatient:          [name]   [facility] If 7PM-7AM, please contact night-coverage at [email or web address], Office  [phone]   08/20/2024, 8:47 AM  LOS: 8 days

## 2024-08-20 NOTE — Progress Notes (Signed)
 Inpatient Rehab Admissions Coordinator:   Continuing to await insurance decision from appeal. No CIR beds are available today. Will continue to follow.   Rehab Admissons Coordinator Tali Cleaves, [place], [place] [phone]

## 2024-08-20 NOTE — Progress Notes (Signed)
 Occupational Therapy Treatment Patient Details Name: [name] MRN: [id number] DOB: 1946/01/18 Today's Date: 08/20/2024   History of present illness 78 y.o. male presents to [facility] 08/08/24 with acute R LE weakness and decreased sensation. CT head/MRI negative. 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 MRI lumbar spine showed severe chronic compression deformity of L1, atrophy of R psoas muscle PMHx: relapsed large B-cell lymphoma, coronary artery disease, recurrent UTIs, atrial fibrillation (not on blood thinner due to bleeding/hematuria), chronic systolic heart failure.   OT comments  Patient seen in order to progress with functional activity tolerance and ADL management. Patient able to ambulate with min to mod A functional household distances. Patient continues to require cues for technique and upright posture prior to advancing with functional mobility. Patient completing incremental squats from recliner, with min A for balance provided. Noted increased fatigue at end of session, with VSS on RA. OT recommendation remains appropriate; will continue to follow.       If plan is discharge home, recommend the following:  A lot of help with walking and/or transfers;A lot of help with bathing/dressing/bathroom;Assistance with cooking/housework;Assist for transportation;Help with stairs or ramp for entrance   Equipment Recommendations  Other (comment) (defer to next level of care)    Recommendations for Other Services      Precautions / Restrictions Precautions Precautions: Fall Recall of Precautions/Restrictions: Intact Precaution/Restrictions Comments: watch HR Restrictions Weight Bearing Restrictions Per Provider Order: No       Mobility Bed Mobility Overal bed mobility: Needs Assistance             General bed mobility comments: up in recliner upon OT entry    Transfers Overall transfer level: Needs assistance Equipment used: Rolling walker (2  wheels) Transfers: Sit to/from Stand Sit to Stand: Min assist           General transfer comment: Min A to come up into standing, occasional mod A required due to increased fatigue     Balance Overall balance assessment: Needs assistance, History of Falls Sitting-balance support: No upper extremity supported, Feet supported Sitting balance-Leahy Scale: Good Sitting balance - Comments: sitting edge of recliner   Standing balance support: Bilateral upper extremity supported, During functional activity, Reliant on assistive device for balance Standing balance-Leahy Scale: Poor Standing balance comment: reliant on UE support                           ADL either performed or assessed with clinical judgement   ADL Overall ADL's : Needs assistance/impaired                         Toilet Transfer: Ambulation;Regular Toilet;Rolling walker (2 wheels);Minimal assistance           Functional mobility during ADLs: Moderate assistance;+2 for physical assistance;Rolling walker (2 wheels) General ADL Comments: Patient seen in order to progress with functional activity tolerance and ADL management. Patient able to ambulate with min to mod A functional household distances. Patient continues to require cues for technique and upright posture prior to advancing with functional mobility. Patient completing incremental squats from recliner, with min A for balance provided. Noted increased fatigue at end of session, with VSS on RA.  OT recommendation remains appropriate; will continue to follow.    Extremity/Trunk Assessment              Vision       Perception  Praxis     Communication Communication Communication: Impaired Factors Affecting Communication: Hearing impaired   Cognition Arousal: Alert Behavior During Therapy: WFL for tasks assessed/performed Cognition: No apparent impairments                               Following commands:  Intact        Cueing   Cueing Techniques: Verbal cues, Tactile cues, Visual cues  Exercises Other Exercises Other Exercises: incremental squats in standing x7    Shoulder Instructions       General Comments VSS on RA    Pertinent Vitals/ Pain          Home Living                                          Prior Functioning/Environment              Frequency  Min 2X/week        Progress Toward Goals  OT Goals(current goals can now be found in the care plan section)  Progress towards OT goals: Progressing toward goals  Acute Rehab OT Goals Patient Stated Goal: to get better OT Goal Formulation: With patient Time For Goal Achievement: 08/24/24 Potential to Achieve Goals: Good  Plan      Co-evaluation                 AM-PAC OT 6 Clicks Daily Activity     Outcome Measure   Help from another person eating meals?: None Help from another person taking care of personal grooming?: A Little Help from another person toileting, which includes using toliet, bedpan, or urinal?: A Lot Help from another person bathing (including washing, rinsing, drying)?: A Lot Help from another person to put on and taking off regular upper body clothing?: A Little Help from another person to put on and taking off regular lower body clothing?: A Little 6 Click Score: 17    End of Session Equipment Utilized During Treatment: Gait belt;Rolling walker (2 wheels)  OT Visit Diagnosis: Unsteadiness on feet (R26.81);Repeated falls (R29.6);History of falling (Z91.81)   Activity Tolerance Patient tolerated treatment well   Patient Left with call bell/phone within reach;in chair;with chair alarm set;with nursing/sitter in room   Nurse Communication Mobility status        Time: 9046-8986 OT Time Calculation (min): 20 min  Charges: OT General Charges $OT Visit: 1 Visit OT Treatments $Self Care/Home Management : 8-22 mins  Ronal Gift E. Lydon Vansickle, OTR/L Acute  Rehabilitation Services [phone]   Ronal Gift Salt 08/20/2024, 12:20 PM

## 2024-08-20 NOTE — Plan of Care (Signed)
  Problem: Education: Goal: Knowledge of General Education information will improve Description: Including pain rating scale, medication(s)/side effects and non-pharmacologic comfort measures Outcome: Progressing   Problem: Health Behavior/Discharge Planning: Goal: Ability to manage health-related needs will improve Outcome: Progressing   Problem: Clinical Measurements: Goal: Will remain free from infection Outcome: Progressing   Problem: Clinical Measurements: Goal: Will remain free from infection Outcome: Progressing   Problem: Activity: Goal: Risk for activity intolerance will decrease Outcome: Progressing   Problem: Nutrition: Goal: Adequate nutrition will be maintained Outcome: Progressing

## 2024-08-20 NOTE — Plan of Care (Signed)
  Problem: Education: Goal: Knowledge of General Education information will improve Description: Including pain rating scale, medication(s)/side effects and non-pharmacologic comfort measures Outcome: Progressing   Problem: Health Behavior/Discharge Planning: Goal: Ability to manage health-related needs will improve Outcome: Progressing   Problem: Clinical Measurements: Goal: Ability to maintain clinical measurements within normal limits will improve Outcome: Progressing Goal: Will remain free from infection Outcome: Progressing Goal: Diagnostic test results will improve Outcome: Progressing Goal: Respiratory complications will improve Outcome: Progressing Goal: Cardiovascular complication will be avoided Outcome: Progressing   Problem: Activity: Goal: Risk for activity intolerance will decrease Outcome: Progressing   Problem: Nutrition: Goal: Adequate nutrition will be maintained Outcome: Progressing   Problem: Coping: Goal: Level of anxiety will decrease Outcome: Progressing   Problem: Elimination: Goal: Will not experience complications related to bowel motility Outcome: Progressing Goal: Will not experience complications related to urinary retention Outcome: Progressing   Problem: Pain Managment: Goal: General experience of comfort will improve and/or be controlled Outcome: Progressing   Problem: Safety: Goal: Ability to remain free from injury will improve Outcome: Progressing   Problem: Skin Integrity: Goal: Risk for impaired skin integrity will decrease Outcome: Progressing   Problem: Education: Goal: Knowledge of disease or condition will improve Outcome: Progressing Goal: Knowledge of secondary prevention will improve (MUST DOCUMENT ALL) Outcome: Progressing Goal: Knowledge of patient specific risk factors will improve (DELETE if not current risk factor) Outcome: Progressing   Problem: Ischemic Stroke/TIA Tissue Perfusion: Goal: Complications of  ischemic stroke/TIA will be minimized Outcome: Progressing   Problem: Coping: Goal: Will verbalize positive feelings about self Outcome: Progressing Goal: Will identify appropriate support needs Outcome: Progressing   Problem: Health Behavior/Discharge Planning: Goal: Ability to manage health-related needs will improve Outcome: Progressing Goal: Goals will be collaboratively established with patient/family Outcome: Progressing   Problem: Self-Care: Goal: Ability to participate in self-care as condition permits will improve Outcome: Progressing Goal: Verbalization of feelings and concerns over difficulty with self-care will improve Outcome: Progressing Goal: Ability to communicate needs accurately will improve Outcome: Progressing   Problem: Nutrition: Goal: Risk of aspiration will decrease Outcome: Progressing Goal: Dietary intake will improve Outcome: Progressing

## 2024-08-21 DIAGNOSIS — R29898 Other symptoms and signs involving the musculoskeletal system: Secondary | ICD-10-CM | POA: Diagnosis not present

## 2024-08-21 MED ORDER — ATORVASTATIN CALCIUM 40 MG PO TABS: 40.0000 mg | ORAL_TABLET | Freq: Every day | ORAL | 3 refills | Status: AC

## 2024-08-21 MED ORDER — ASPIRIN 81 MG PO CHEW: 81.0000 mg | CHEWABLE_TABLET | Freq: Every day | ORAL | 3 refills | Status: AC

## 2024-08-21 NOTE — Progress Notes (Signed)
  Inpatient Rehabilitation Admissions Coordinator   [facility] Medicare has upheld denial for CIR admit. I met with patient and he is aware. He is requesting home with OP therapy. I will alert acute team and TOC. We will sign off.  Heron Leavell, RN, MSN Rehab Admissions Coordinator [phone] 08/21/2024 10:05 AM

## 2024-08-21 NOTE — Plan of Care (Signed)
  Problem: Education: Goal: Knowledge of General Education information will improve Description: Including pain rating scale, medication(s)/side effects and non-pharmacologic comfort measures 08/21/2024 0542 by Baldwin Doran BIRCH, RN Outcome: Not Progressing 08/21/2024 0530 by Baldwin Doran BIRCH, RN Outcome: Progressing   Problem: Health Behavior/Discharge Planning: Goal: Ability to manage health-related needs will improve 08/21/2024 0542 by Baldwin Doran BIRCH, RN Outcome: Not Progressing 08/21/2024 0530 by Baldwin Doran BIRCH, RN Outcome: Progressing   Problem: Clinical Measurements: Goal: Ability to maintain clinical measurements within normal limits will improve 08/21/2024 0542 by Baldwin Doran BIRCH, RN Outcome: Not Progressing 08/21/2024 0530 by Baldwin Doran D, RN Outcome: Progressing Goal: Will remain free from infection 08/21/2024 0542 by Baldwin Doran BIRCH, RN Outcome: Not Progressing 08/21/2024 0530 by Baldwin Doran D, RN Outcome: Progressing Goal: Diagnostic test results will improve 08/21/2024 0542 by Baldwin Doran BIRCH, RN Outcome: Not Progressing 08/21/2024 0530 by Baldwin Doran D, RN Outcome: Progressing Goal: Respiratory complications will improve 08/21/2024 0542 by Baldwin Doran BIRCH, RN Outcome: Not Progressing 08/21/2024 0530 by Baldwin Doran D, RN Outcome: Progressing Goal: Cardiovascular complication will be avoided 08/21/2024 0542 by Baldwin Doran BIRCH, RN Outcome: Not Progressing 08/21/2024 0530 by Baldwin Doran BIRCH, RN Outcome: Progressing   Problem: Activity: Goal: Risk for activity intolerance will decrease 08/21/2024 0542 by Baldwin Doran BIRCH, RN Outcome: Not Progressing 08/21/2024 0530 by Baldwin Doran BIRCH, RN Outcome: Progressing   Problem: Nutrition: Goal: Adequate nutrition will be maintained 08/21/2024 0542 by Baldwin Doran BIRCH, RN Outcome: Not Progressing 08/21/2024 0530 by Baldwin Doran D, RN Outcome:  Progressing   Problem: Coping: Goal: Level of anxiety will decrease 08/21/2024 0542 by Baldwin Doran BIRCH, RN Outcome: Not Progressing 08/21/2024 0530 by Baldwin Doran D, RN Outcome: Progressing   Problem: Elimination: Goal: Will not experience complications related to bowel motility 08/21/2024 0542 by Baldwin Doran BIRCH, RN Outcome: Not Progressing 08/21/2024 0530 by Baldwin Doran BIRCH, RN Outcome: Progressing Goal: Will not experience complications related to urinary retention 08/21/2024 0542 by Baldwin Doran BIRCH, RN Outcome: Not Progressing 08/21/2024 0530 by Baldwin Doran D, RN Outcome: Progressing   Problem: Pain Managment: Goal: General experience of comfort will improve and/or be controlled 08/21/2024 0542 by Baldwin Doran BIRCH, RN Outcome: Not Progressing 08/21/2024 0530 by Baldwin Doran BIRCH, RN Outcome: Progressing   Problem: Safety: Goal: Ability to remain free from injury will improve 08/21/2024 0542 by Baldwin Doran BIRCH, RN Outcome: Not Progressing 08/21/2024 0530 by Baldwin Doran D, RN Outcome: Progressing   Problem: Skin Integrity: Goal: Risk for impaired skin integrity will decrease 08/21/2024 0542 by Baldwin Doran BIRCH, RN Outcome: Not Progressing 08/21/2024 0530 by Baldwin Doran D, RN Outcome: Progressing   Problem: Ischemic Stroke/TIA Tissue Perfusion: Goal: Complications of ischemic stroke/TIA will be minimized 08/21/2024 0542 by Baldwin Doran BIRCH, RN Outcome: Not Progressing 08/21/2024 0530 by Baldwin Doran D, RN Outcome: Progressing   Problem: Coping: Goal: Will verbalize positive feelings about self 08/21/2024 0542 by Baldwin Doran BIRCH, RN Outcome: Not Progressing 08/21/2024 0530 by Baldwin Doran BIRCH, RN Outcome: Progressing Goal: Will identify appropriate support needs 08/21/2024 0542 by Baldwin Doran BIRCH, RN Outcome: Not Progressing 08/21/2024 0530 by Baldwin Doran BIRCH, RN Outcome: Progressing

## 2024-08-21 NOTE — TOC Transition Note (Addendum)
 Transition of Care [facility]) - Discharge Note   Patient Details  Name: [name] MRN: [id number] Date of Birth: [birth year]  Transition of Care [facility] CM/SW Contact:  Andrez JULIANNA George, RN Phone Number: 08/21/2024, 12:16 PM   Clinical Narrative:     Pt was denied for CIR per his insurance. Pt has decided to dc home with outpatient therapy. PT/OT recommended but he refused OT. He asked to attend PT at [facility]. CM has faxed the referral [phone]. Information on the AVS.  Pt has supervision / assistance at home. Spouse will provide transport home.  Final next level of care: OP Rehab Barriers to Discharge: No Barriers Identified   Patient Goals and CMS Choice     Choice offered to / list presented to : Patient      Discharge Placement                       Discharge Plan and Services Additional resources added to the After Visit Summary for                                       Social Drivers of Health (SDOH) Interventions SDOH Screenings   Food Insecurity: No Food Insecurity (08/09/2024)  Housing: Low Risk  (08/09/2024)  Transportation Needs: No Transportation Needs (08/09/2024)  Utilities: Not At Risk (08/09/2024)  Alcohol  Screen: Low Risk  (05/24/2024)  Depression (PHQ2-9): Low Risk  (02/02/2024)  Financial Resource Strain: Low Risk  (05/24/2024)  Physical Activity: Inactive (05/24/2024)  Social Connections: Moderately Integrated (08/09/2024)  Recent Concern: Social Connections - Moderately Isolated (05/24/2024)  Stress: Stress Concern Present (05/24/2024)  Tobacco Use: Medium Risk (08/09/2024)  Health Literacy: Adequate Health Literacy (02/02/2024)     Readmission Risk Interventions    09/17/2022   11:42 AM 04/27/2022    9:56 AM 04/26/2022    6:28 PM  Readmission Risk Prevention Plan  Transportation Screening Complete  Complete  Medication Review (RN Care Manager) Complete  Referral to Pharmacy  PCP or Specialist appointment within  3-5 days of discharge Complete Complete   HRI or Home Care Consult Complete  Complete  SW Recovery Care/Counseling Consult Complete Complete   Palliative Care Screening Not Applicable  Not Applicable  Skilled Nursing Facility Not Applicable  Not Applicable

## 2024-08-21 NOTE — Discharge Summary (Signed)
 Physician Discharge Summary   Patient: [name] MRN: [id number] DOB: [birth year]  Admit date:     08/08/2024  Discharge date: 08/21/24  Discharge Physician: [name]   PCP: Garald Karlynn GAILS, MD   Recommendations at discharge:   Follow-up PCP as outpatient Outpatient PT arranged Follow-up cardiology as outpatient to discuss anticoagulation Continue aspirin  81 mg p.o. daily  Discharge Diagnoses: Principal Problem:   Right leg weakness Active Problems:   TIA (transient ischemic attack)  Resolved Problems:   TIA (transient ischemic attack)  Hospital Course: 78 y.o. male with medical history significant of history of relapsed large B-cell lymphoma, coronary artery disease, recurrent UTIs, atrial fibrillation (not on blood thinner due to bleeding/hematuria), chronic systolic heart failure.  Patient was in his normal health on 10/29 when he attended a heart failure clinic appointment.  After the doctor appointment he had acute right leg weakness-he describes as unable to stand as it gives out.  No stroke found on MRI.  Patient did have abnormal lumbar spine MRI-seen by Dr. Gillie (no note yet written but he reports to me that patient needs to be seen by cardiology for risk factor mitigation if surgery is ever needed).  Insurance authorization pending for CIR     Assessment and Plan:  Right LE weakness -Improving, working with PT in the hospital -Patient came to hospital with several falls at home -CT head and neck was negative, MRI brain did not show acute abnormalities -MRI of lumbar spine showed severe chronic compression deformity of L1, multifactorial degenerative changes at L3-4, L4-5 with resultant moderate spinal stenosis -Neurology was consulted, recommended outpatient EMG for further evaluation of weakness -Dr. Juvenal discussed MRI report with Dr. Gillie, neurosurgeon; recommended outpatient evaluation, no emergent surgery recommended -PT OT eval -Recommended  CIR however insurance company has denied CIR - Patient has appealed the decision.   - Decision was upheld.  Will go home with outpatient PT.     Chronic atrial fibrillation Rate controlled - Off AC due to bleeds and sees from chart review appears to be related to hematuria but also patient is a fall risk -Started on aspirin  81 mg p.o. daily -Will discuss with cardiology as outpatient to restart anticoagulation   CKD stage IIIB -baseline around 2 -Stable   Hypothyroidism -on synthroid  -TSH is elevated at 31.7, will need outpatient follow-up - Free T4 is normal -Check TSH as outpatient   BPH -proscar - Continue Uroxatrol -Discontinue Flomax    HTN -resume home meds   Hx of relapsed large B-cell lymphoma   s/p R-CHOP 2019 - s/p RGDP 2023 - recently treated with Epcoritamab     Recurrent UTI - Takes cephalexin  250 mg p.o. nightly chronically at home for UTI prevention -UA obtained today shows large leukocytes, rare bacteria, 21-50 RBCs per high-power field -Patient has history of recurrent UTI and is chronically on cephalexin  -Continue cephalexin . -He is asymptomatic at this time, will continue to monitor   Chronic systolic CHF -follows with HF clinic- Dr. KATHEE -05/2024 LVEF 25-30%  -GDT as able   OSA -does not tolerate CPAP   Hyperlipidemia - Patient is on every 2 weeks Repatha shots as outpatient - Continue low-dose of atorvastatin  40 mg daily   Osteoporosis - Patient is on testosterone  injection every 2 weeks            Consultants: Neurology Procedures performed: Echocardiogram Disposition: Home Diet recommendation:  Discharge Diet Orders (From admission, onward)     Start  Ordered   08/21/24 0000  Diet - low sodium heart healthy        08/21/24 1203           Regular diet DISCHARGE MEDICATION: Allergies as of 08/21/2024       Reactions   Short Ragweed Pollen Ext Cough        Medication List     STOP taking these medications     tamsulosin  0.4 MG Caps capsule Commonly known as: FLOMAX        TAKE these medications    acetaminophen  325 MG tablet Commonly known as: TYLENOL  Take 2 tablets (650 mg total) by mouth every 6 (six) hours as needed for mild pain (or Fever >/= 101).   acyclovir  400 MG tablet Commonly known as: ZOVIRAX  Take 1 tablet (400 mg total) by mouth 2 (two) times daily.   albuterol  108 (90 Base) MCG/ACT inhaler Commonly known as: VENTOLIN  HFA Inhale 2 puffs into the lungs every 6 (six) hours as needed for wheezing or shortness of breath.   alfuzosin  10 MG 24 hr tablet Commonly known as: UROXATRAL  TAKE 1 TABLET AT BEDTIME   allopurinol  100 MG tablet Commonly known as: ZYLOPRIM  TAKE 1 TABLET EVERY DAY   amoxicillin  500 MG capsule Commonly known as: AMOXIL  Take 2,000 mg by mouth daily as needed (for dental procedures).   ascorbic acid  500 MG tablet Commonly known as: VITAMIN C  Take 500 mg by mouth daily with lunch.   aspirin  81 MG chewable tablet Chew 1 tablet (81 mg total) by mouth daily. Start taking on: August 22, 2024   atorvastatin  40 MG tablet Commonly known as: LIPITOR  Take 1 tablet (40 mg total) by mouth daily. Start taking on: August 22, 2024 What changed:  medication strength how much to take   buPROPion  300 MG 24 hr tablet Commonly known as: WELLBUTRIN  XL TAKE 1 TABLET EVERY DAY WITH BREAKFAST   CALCIUM  CITRATE + D PO Take 1 tablet by mouth 2 (two) times daily. 1200mg  of Calcium  and 1000 units vitamin D3   cephALEXin  250 MG capsule Commonly known as: KEFLEX  Take 250 mg by mouth daily. What changed: Another medication with the same name was removed. Continue taking this medication, and follow the directions you see here.   clonazePAM  1 MG tablet Commonly known as: KLONOPIN  TAKE 1 TABLET THREE TIMES DAILY AS NEEDED FOR ANXIETY   CRANBERRY PO Take 1 tablet by mouth in the morning and at bedtime.   denosumab  60 MG/ML Sosy injection Commonly known as:  PROLIA  Inject 60 mg as directed every 6 (six) months.   DULoxetine  60 MG capsule Commonly known as: CYMBALTA  TAKE 1 CAPSULE TWICE DAILY   EPINEPHrine  0.3 mg/0.3 mL Soaj injection Commonly known as: EPI-PEN Inject 0.3 mg into the muscle once as needed for anaphylaxis (severe allergic reaction).   ezetimibe  10 MG tablet Commonly known as: ZETIA  Take 1 tablet (10 mg total) by mouth daily.   ferrous sulfate  325 (65 FE) MG tablet Take 325 mg by mouth 2 (two) times daily with a meal.   finasteride 5 MG tablet Commonly known as: PROSCAR Take 5 mg by mouth daily.   levothyroxine  125 MCG tablet Commonly known as: SYNTHROID  TAKE 1 TABLET EVERY DAY AT 6AM   loratadine  10 MG tablet Commonly known as: CLARITIN  Take 10 mg by mouth daily.   losartan  25 MG tablet Commonly known as: COZAAR  Take 0.5 tablets (12.5 mg total) by mouth every evening.   metoprolol  succinate 25 MG 24  hr tablet Commonly known as: Toprol  XL Take 1 tablet (25 mg total) by mouth daily.   multivitamin with minerals Tabs tablet Take 1 tablet by mouth every morning. Men's One-A-Day 50+   Myrbetriq  25 MG Tb24 tablet Generic drug: mirabegron  ER TAKE 1 TABLET EVERY DAY   nitroGLYCERIN  0.4 MG SL tablet Commonly known as: NITROSTAT  Place 0.4 mg under the tongue every 5 (five) minutes as needed for chest pain.   omeprazole  40 MG capsule Commonly known as: PRILOSEC TAKE 1 CAPSULE EVERY DAY BEFORE BREAKFAST   Potassium Citrate 15 MEQ (1620 MG) Tbcr TAKE 1 TABLET IN THE MORNING AND AT BEDTIME   PROBIOTIC PO Take 1 capsule by mouth daily with breakfast.   Repatha SureClick 140 MG/ML Soaj Generic drug: Evolocumab Inject 140 mg into the skin every 14 (fourteen) days.   Skyrizi Pen 150 MG/ML pen Generic drug: risankizumab-rzaa Inject 150 mg into the skin every 3 (three) months.   solifenacin 10 MG tablet Commonly known as: VESICARE Take 10 mg by mouth daily.   testosterone  enanthate 200 MG/ML  injection Commonly known as: DELATESTRYL  Inject 1 mL (200 mg total) into the muscle every 14 (fourteen) days. Every other Friday   Vitamin D  50 MCG (2000 UT) tablet Take 4,000 Units by mouth in the morning and at bedtime.        Follow-up Information     [facility]. Schedule an appointment as soon as possible for a visit.   Contact information: [address] [place]  [address] [phone]        Plotnikov, Karlynn GAILS, MD Follow up.   Specialty: Internal Medicine Why: Check TSH as outpatient Discuss regarding potassium supplementation Contact information: [address] [place] [address] [phone]                Discharge Exam: Fredricka Weights   08/08/24 1854  Weight: 115.7 kg   General-appears in no acute distress Heart-S1-S2, regular, no murmur auscultated Lungs-clear to auscultation bilaterally, no wheezing or crackles auscultated Abdomen-soft, nontender, no organomegaly Extremities-no edema in the lower extremities Neuro-alert, oriented x3, no focal deficit noted  Condition at discharge: good  The results of significant diagnostics from this hospitalization (including imaging, microbiology, ancillary and laboratory) are listed below for reference.   Imaging Studies: MR Lumbar Spine W Wo Contrast Result Date: 08/10/2024 CLINICAL DATA:  Initial evaluation for lumbar radiculopathy. EXAM: MRI LUMBAR SPINE WITHOUT AND WITH CONTRAST TECHNIQUE: Multiplanar and multiecho pulse sequences of the lumbar spine were obtained without and with intravenous contrast. CONTRAST:  10mL GADAVIST  GADOBUTROL  1 MMOL/ML IV SOLN COMPARISON:  Prior CT from 08/13/2023 and MRI from 02/08/2013. FINDINGS: Segmentation: Standard segmentation. Lowest well-formed disc space labeled the L5-S1 level. Alignment: Severe chronic compression deformity of L1 with 8 mm bony retropulsion and focal kyphotic angulation at the thoracolumbar  junction. No other listhesis. Vertebrae: Severe chronic L1 compression fracture with vertebral plana formation centrally and 8 mm bony retropulsion. No residual marrow edema or enhancement. Vertebral body height otherwise relatively well maintained with no other acute or chronic fracture. Minimal concavity at the superior endplate of L5 favored to be degenerative in nature. Underlying bone marrow signal intensity heterogeneous but with no worrisome osseous lesions. Minimal reactive marrow edema and enhancement present about the L4-5 interspace on the left, degenerative in nature. No other abnormal marrow edema or enhancement. Conus medullaris and cauda equina: Conus extends to the L1 level. Focal kinking of the distal conus related to  the chronic L1 fracture without cord signal changes (series 3, image 9). Nerve roots of the cauda equina within normal limits. No abnormal enhancement. Paraspinal and other soft tissues: Paraspinous soft tissues demonstrate no acute finding. Asymmetric right renal atrophy with hydroureteronephrosis, similar as compared to prior CT, and possibly chronic in nature. Chronic atrophy involving the right psoas muscle related to a right total hip arthroplasty noted. Disc levels: T12-L1 8 mm bony retropulsion related to the chronic L1 compression fracture. Retropulsed bone flattens and indents the ventral thecal sac. Mild bilateral facet hypertrophy. Minimal flattening and displacement of the distal conus without cord signal changes. Moderate spinal stenosis. Foramina remain patent. L1-2: Degenerative intervertebral disc space narrowing with disc bulge and disc desiccation. 8 mm retropulsed bone indents the ventral thecal sac. Mild facet and ligament flavum hypertrophy. Up to moderate spinal stenosis at the level of L1 due to bony retropulsion. Moderate bilateral L1 foraminal stenosis. L2-3: Disc desiccation with mild disc bulge, slightly asymmetric to the left. Mild facet and ligament flavum  hypertrophy. Associated trace joint effusions. No significant spinal stenosis. Moderate left L2 foraminal narrowing. Right neural foramina remains patent. L3-4: Disc desiccation with diffuse disc bulge, slightly asymmetric to the left. Mild to moderate facet and ligament flavum hypertrophy. Mild prominence of the dorsal epidural fat. Resultant moderate spinal stenosis. Mild right with mild to moderate left L3 foraminal narrowing. L4-5: Disc desiccation with mild disc bulge. Moderate facet and ligament flavum hypertrophy. Prominence of the dorsal epidural fat. Resultant moderate spinal stenosis. Moderate bilateral L4 foraminal narrowing, slightly greater on the left. L5-S1: Disc desiccation with mild disc bulge. Bulging disc mildly indents the ventral thecal sac, closely approximating the descending S1 nerve roots without overt neural impingement or displacement. Mild bilateral facet arthrosis. 1 cm complex rim enhancing lesion posterior to the left L5-S1 facet likely reflects a synovial cyst (series 3, image 12). This is not in a position to cause neural impingement. No significant spinal stenosis. Foramina remain patent. IMPRESSION: 1. Severe chronic compression deformity of L1 with 8 mm bony retropulsion and focal kyphotic angulation at the thoracolumbar junction. Resultant moderate spinal stenosis at this level with mild flattening and displacement of the distal conus, but no cord signal changes. 2. Multifactorial degenerative changes at L3-4 and L4-5 with resultant moderate spinal stenosis, with mild to moderate bilateral L3 and L4 foraminal narrowing as above. 3. Moderate left L2 foraminal stenosis related to disc bulge and facet hypertrophy. 4. Atrophy of the right psoas muscle in the setting of underlying right total hip arthroplasty. Findings could contribute to right lower extremity weakness. 5. Asymmetric right renal atrophy with hydroureteronephrosis, similar as compared to prior CT, and possibly chronic  in nature. Correlation with cross-sectional imaging of the abdomen and pelvis recommended as warranted. Electronically Signed   By: Morene Hoard M.D.   On: 08/10/2024 19:33   ECHOCARDIOGRAM COMPLETE Result Date: 08/10/2024    ECHOCARDIOGRAM REPORT   Patient Name:   [name] Date of Exam: 08/10/2024 Medical Rec #:  [id number]        Height:       76.0 in Accession #:    [id number]       Weight:       255.0 lb Date of Birth:  14-Jun-1946        BSA:          2.456 m Patient Age:    77 years         BP:  115/75 mmHg Patient Gender: M                HR:           95 bpm. Exam Location:  Inpatient Procedure: 2D Echo, Cardiac Doppler, Color Doppler and Intracardiac            Opacification Agent (Both Spectral and Color Flow Doppler were            utilized during procedure). Indications:    Stroke  History:        Patient has prior history of Echocardiogram examinations, most                 recent 05/23/2024. CAD, Abnormal ECG, TIA and Stroke,                 Arrythmias:Atrial Fibrillation; Risk Factors:Hypertension,                 Dyslipidemia and Sleep Apnea. Lyphoma.  Sonographer:    Ellouise Mose RDCS Referring Phys: [phone] JESSICA U [facility]  Sonographer Comments: Technically difficult study due to poor echo windows. Image acquisition challenging due to respiratory motion. Study delayed 15 mins to get patient in bed. Two assist with steady. Extremely difficult study. Patient attempted to turn. IMPRESSIONS  1. Left ventricular ejection fraction, by estimation, is 35 to 40%. The left ventricle has moderately decreased function. The left ventricle demonstrates global hypokinesis. The left ventricular internal cavity size was mildly dilated. Diastology indeterminate due to atrial fibrillation.  2. Right ventricular systolic function is mildly reduced. The right ventricular size is normal. Tricuspid regurgitation signal is inadequate for assessing PA pressure.  3. The mitral valve is normal in  structure. Trivial mitral valve regurgitation. No evidence of mitral stenosis.  4. The aortic valve was not well visualized. Aortic valve regurgitation is not visualized. No aortic stenosis is present.  5. The inferior vena cava is normal in size with greater than 50% respiratory variability, suggesting right atrial pressure of 3 mmHg. Comparison(s): Improvement in LV systolic function. Conclusion(s)/Recommendation(s): Improvement in LVEF to 35-40%. Very limited valvular assessment due to poor acoustic windows. Within this limitation, no notable valvular disease. FINDINGS  Left Ventricle: Left ventricular ejection fraction, by estimation, is 35 to 40%. The left ventricle has moderately decreased function. The left ventricle demonstrates global hypokinesis. Definity  contrast agent was given IV to delineate the left ventricular endocardial borders. The left ventricular internal cavity size was mildly dilated. There is no left ventricular hypertrophy. Diastology indeterminate due to atrial fibrillation. Right Ventricle: The right ventricular size is normal. No increase in right ventricular wall thickness. Right ventricular systolic function is mildly reduced. Tricuspid regurgitation signal is inadequate for assessing PA pressure. Left Atrium: Left atrial size was normal in size. Right Atrium: Right atrial size was normal in size. Pericardium: There is no evidence of pericardial effusion. Mitral Valve: The mitral valve is normal in structure. Trivial mitral valve regurgitation. No evidence of mitral valve stenosis. Tricuspid Valve: The tricuspid valve is normal in structure. Tricuspid valve regurgitation is not demonstrated. No evidence of tricuspid stenosis. Aortic Valve: The aortic valve was not well visualized. Aortic valve regurgitation is not visualized. No aortic stenosis is present. Pulmonic Valve: The pulmonic valve was normal in structure. Pulmonic valve regurgitation is not visualized. No evidence of pulmonic  stenosis. Aorta: The aortic root was not well visualized and the ascending aorta was not well visualized. Venous: The inferior vena cava is normal in size with greater than  50% respiratory variability, suggesting right atrial pressure of 3 mmHg. IAS/Shunts: No atrial level shunt detected by color flow Doppler.  LEFT VENTRICLE PLAX 2D LVIDd:         5.40 cm LVIDs:         4.80 cm LV PW:         1.30 cm LV IVS:        1.30 cm LVOT diam:     2.20 cm LV SV:         49 LV SV Index:   20 LVOT Area:     3.80 cm  LV Volumes (MOD) LV vol d, MOD A2C: 94.6 ml LV vol d, MOD A4C: 125.0 ml LV vol s, MOD A2C: 66.7 ml LV vol s, MOD A4C: 88.5 ml LV SV MOD A2C:     27.9 ml LV SV MOD A4C:     125.0 ml LV SV MOD BP:      28.0 ml RIGHT VENTRICLE            IVC RV S prime:     7.40 cm/s  IVC diam: 2.10 cm TAPSE (M-mode): 1.0 cm LEFT ATRIUM             Index        RIGHT ATRIUM           Index LA diam:        4.70 cm 1.91 cm/m   RA Area:     22.60 cm LA Vol (A2C):   56.9 ml 23.17 ml/m  RA Volume:   63.90 ml  26.02 ml/m LA Vol (A4C):   38.8 ml 15.80 ml/m LA Biplane Vol: 45.3 ml 18.44 ml/m  AORTIC VALVE LVOT Vmax:   89.50 cm/s LVOT Vmean:  59.300 cm/s LVOT VTI:    0.129 m  AORTA Ao Root diam: 3.90 cm MITRAL VALVE MV Area (PHT): 4.21 cm    SHUNTS MV Decel Time: 180 msec    Systemic VTI:  0.13 m MV E velocity: 78.10 cm/s  Systemic Diam: 2.20 cm Georganna Archer Electronically signed by Georganna Archer Signature Date/Time: 08/10/2024/2:57:06 PM    Final    MR BRAIN WO CONTRAST Result Date: 08/10/2024 CLINICAL DATA:  Initial evaluation for acute neuro deficit, stroke suspected. EXAM: MRI HEAD WITHOUT CONTRAST TECHNIQUE: Multiplanar, multiecho pulse sequences of the brain and surrounding structures were obtained without intravenous contrast. COMPARISON:  Comparison made with CT from 08/08/2024. FINDINGS: Brain: Examination mildly degraded by motion artifact. Diffuse prominence of the CSF containing spaces compatible generalized  cerebral atrophy. Patchy and confluent T2/FLAIR hyperintensity involving the periventricular, deep, and subcortical white matter both cerebral hemispheres, nonspecific, but most commonly related to chronic microvascular ischemic disease. Changes are mild for age. Small focus of encephalomalacia and gliosis involving the anterior left frontal lobe (series 6, image 22). Finding favored to be related to prior trauma, although could potentially be related to a remote infarct. No abnormal foci of restricted diffusion to suggest acute or subacute ischemia. Gray-white matter differentiation maintained. No acute intracranial hemorrhage. Single chronic microhemorrhage noted within the inferior right cerebellum. No mass lesion, midline shift or mass effect. No hydrocephalus or extra-axial fluid collection. Pituitary gland within normal limits. Vascular: Major intracranial vascular flow voids are maintained. Skull and upper cervical spine: Craniocervical junction within normal limits. Bone marrow signal intensity overall within normal limits. No scalp soft tissue abnormality. Sinuses/Orbits: Prior bilateral ocular lens replacement. Mild mucosal thickening present about the ethmoidal air cells. Paranasal sinuses are otherwise  clear. No significant mastoid effusion. Other: Suspected postoperative changes at the right nasopharynx noted. IMPRESSION: 1. No acute intracranial abnormality. 2. Small focus of encephalomalacia and gliosis involving the anterior left frontal lobe, favored to be related to prior trauma, although could potentially be related to a remote infarct. 3. Underlying age-related cerebral atrophy with mild chronic small vessel ischemic disease. Electronically Signed   By: Morene Hoard M.D.   On: 08/10/2024 00:46   CT ANGIO HEAD NECK W WO CM Result Date: 08/08/2024 EXAM: CTA Head and Neck with Intravenous Contrast. CT Head without Contrast. CLINICAL HISTORY: Neuro deficit, acute, stroke suspected.  Multiple falls due to RLE weakness. Pt states my right leg keeps giving out. Denies hitting his head or any other pain. TECHNIQUE: Axial CTA images of the head and neck performed with intravenous contrast. MIP reconstructed images were created and reviewed. Axial computed tomography images of the head/brain performed without intravenous contrast. Note: Per PQRS, the description of internal carotid artery percent stenosis, including 0 percent or normal exam, is based on North American Symptomatic Carotid Endarterectomy Trial (NASCET) criteria. Dose reduction technique was used including one or more of the following: automated exposure control, adjustment of mA and kV according to patient size, and/or iterative reconstruction. CONTRAST: Without and with IV contrast. 50 mL (iohexol  (OMNIPAQUE ) 350 MG/ML injection 50 mL IOHEXOL  350 MG/ML SOLN). COMPARISON: Comparison with prior study from 08/13/2023. FINDINGS: CT HEAD: BRAIN: Generalized age related cerebral atrophy with chronic microvascular ischemic disease. Calcified atherosclerosis present about the skull base. No acute intraparenchymal hemorrhage. No mass lesion. No CT evidence for acute territorial infarct. No midline shift or extra-axial collection. VENTRICLES: No hydrocephalus. ORBITS: The orbits are unremarkable. SINUSES AND MASTOIDS: The paranasal sinuses and mastoid air cells are clear. CTA NECK: COMMON CAROTID ARTERIES: Atheromatous change about the right carotid bulb without hemodynamically significant greater than 50% stenosis. Calcified plaque about the left carotid bulb with estimated 50% stenosis by NASCET criteria. No dissection or occlusion. INTERNAL CAROTID ARTERIES: Atheromatous change about the carotid siphons with associated mild to moderate narrowing bilaterally, worse on the right. No dissection or occlusion. VERTEBRAL ARTERIES: Atheromatous change at the origins of both vertebral arteries with severe stenoses. Proximal left V4 segment not well  seen due to venous contamination. Mild atheromatous change about the intradural V4 segments without significant stenosis. No dissection or occlusion. CTA HEAD: ANTERIOR CEREBRAL ARTERIES: No significant stenosis. No occlusion. No aneurysm. MIDDLE CEREBRAL ARTERIES: No significant stenosis. No occlusion. No aneurysm. POSTERIOR CEREBRAL ARTERIES: Fetal type origin of the left PCA. No significant stenosis. No occlusion. No aneurysm. BASILAR ARTERY: No significant stenosis. No occlusion. No aneurysm. OTHER: SOFT TISSUES: 3.7 x 3.1 cm soft tissue nodular lesion seen at the subcutaneous fat of the right lower neck (series 4, image 70) nonspecific, but possibly related to history of lymphoma. Right side of port a cath in place. BONES: Moderate to advanced spondylosis at C5-C6 and C6-C7. Mild chronic compression of 4 mm at the superior endplate of T3 through T5. No acute osseous abnormality. LUNGS: Emphysema. AORTA: Aortic atherosclerosis. IMPRESSION: 1. Negative CTA for LVO or other emergent finding. 2. Left carotid bulb atherosclerotic plaque with approximately 50% stenosis by NASCET criteria. 3. Atheromatous change at the vertebral artery origins with severe stenoses. . 4. Carotid siphon atherosclerosis with mild to moderate narrowing bilaterally, worse on the right. 5. 3.7 cm soft tissue mass involving the soft tissues of the right lower neck indeterminate but possibly related to patient history of lymphoma. Correlation with  physical exam recommended. 6. Emphysema. Aortic atherosclerosis. Electronically signed by: Morene Hoard MD 08/08/2024 09:03 PM EDT RP Workstation: [id number]   CT C-SPINE NO CHARGE Result Date: 08/08/2024 EXAM: CT CERVICAL SPINE WITHOUT CONTRAST 08/08/2024 08:11:54 PM TECHNIQUE: CT of the cervical spine was performed without the administration of intravenous contrast. Multiplanar reformatted images are provided for review. Automated exposure control, iterative reconstruction, and/or  weight based adjustment of the mA/kV was utilized to reduce the radiation dose to as low as reasonably achievable. COMPARISON: None available. CLINICAL HISTORY: Fall, RLE weak. Multiple falls due to RLE weakness. Pt states my right leg keeps giving out. Denies hitting his head or any other pain. FINDINGS: CERVICAL SPINE: BONES AND ALIGNMENT: No acute fracture or traumatic malalignment. Grade 1 anterolisthesis of C4 on C5. Diffusely decreased bone density. DEGENERATIVE CHANGES: Multilevel moderate degenerative changes of the spine. No associated severe osseous neural foraminal or central canal stenosis. SOFT TISSUES: No prevertebral soft tissue swelling. LUNGS (partially visualized): Central lobular and paraseptal at least moderate emphysematous changes. VASCULATURE (partially visualized): Partially visualized right central venous catheter. OTHER: Please see separately dictated CT angiography head and neck. IMPRESSION: 1. No acute abnormality of the cervical spine. Electronically signed by: Morgane Naveau MD 08/08/2024 08:19 PM EDT RP Workstation: [id number]   MR CARDIAC VELOCITY FLOW MAP Result Date: 07/23/2024 CLINICAL DATA:  Cardiomyopathy of uncertain etiology EXAM: CARDIAC MRI TECHNIQUE: The patient was scanned on a 1.5 Tesla GE magnet. A dedicated cardiac coil was used. Functional imaging was done using Fiesta sequences. 2,3, and 4 chamber views were done to assess for RWMA's. Modified Simpson's rule using a short axis stack was used to calculate an ejection fraction on a dedicated work [employer]. The patient received 10 cc of Multihance. After 10 minutes inversion recovery sequences were used to assess for infiltration and scar tissue. FINDINGS: Limited images of the lung fields showed no gross abnormalities. There were 2 small cystic lesions in the right lobe of the liver, 1.2 cm and 1 cm. Normal left ventricular size and wall thickness. Global moderate hypokinesis, LV EF 35%. Normal  right ventricular size with RV EF 40%. Moderate left atrial enlargement. Mild right atrial enlargement. The aortic valve was not well-visualized. There was no significant aortic stenosis, trivial aortic insufficiency (regurgitant fraction 3%). No significant mitral regurgitation was noted. On delayed enhancement imaging, there was subtle mid-wall late gadolinium enhancement (LGE) in the basal septum and a small area of mid-wall LGE at the basal inferoseptal RV insertion site. MEASUREMENTS: MEASUREMENTS LVEDV 185 mL LVEDVi 76 mL/m2 LVSV 65 mL LVEF 35% RVEDV 209 mL RVEDVi 85 mL/m2 RVSV 84 mL RVEF 40% Aortic forward volume 72 mL Aortic regurgitant fraction 3% Global T1 1024, ECV 26% Global T2 47 (within normal limits) IMPRESSION: 1.  Normal LV size with moderate global hypokinesis, LV EF 35%. 2.  Normal RV size with RV EF decreased at 40%. 3. Nonspecific mid-wall LGE at the basal inferior RV insertion site and subtle nonspecific mid-wall LGE in the basal septum. This pattern can be seen with pressure/volume overload. 4.  Normal T1 and T2 parametrics. Dalton Mclean Electronically Signed   By: Ezra Shuck M.D.   On: 07/23/2024 16:51   MR CARDIAC VELOCITY FLOW MAP Result Date: 07/23/2024 CLINICAL DATA:  Cardiomyopathy of uncertain etiology EXAM: CARDIAC MRI TECHNIQUE: The patient was scanned on a 1.5 Tesla GE magnet. A dedicated cardiac coil was used. Functional imaging was done using Fiesta sequences. 2,3, and 4 chamber views were done  to assess for RWMA's. Modified Simpson's rule using a short axis stack was used to calculate an ejection fraction on a dedicated work [employer]. The patient received 10 cc of Multihance. After 10 minutes inversion recovery sequences were used to assess for infiltration and scar tissue. FINDINGS: Limited images of the lung fields showed no gross abnormalities. There were 2 small cystic lesions in the right lobe of the liver, 1.2 cm and 1 cm. Normal left  ventricular size and wall thickness. Global moderate hypokinesis, LV EF 35%. Normal right ventricular size with RV EF 40%. Moderate left atrial enlargement. Mild right atrial enlargement. The aortic valve was not well-visualized. There was no significant aortic stenosis, trivial aortic insufficiency (regurgitant fraction 3%). No significant mitral regurgitation was noted. On delayed enhancement imaging, there was subtle mid-wall late gadolinium enhancement (LGE) in the basal septum and a small area of mid-wall LGE at the basal inferoseptal RV insertion site. MEASUREMENTS: MEASUREMENTS LVEDV 185 mL LVEDVi 76 mL/m2 LVSV 65 mL LVEF 35% RVEDV 209 mL RVEDVi 85 mL/m2 RVSV 84 mL RVEF 40% Aortic forward volume 72 mL Aortic regurgitant fraction 3% Global T1 1024, ECV 26% Global T2 47 (within normal limits) IMPRESSION: 1.  Normal LV size with moderate global hypokinesis, LV EF 35%. 2.  Normal RV size with RV EF decreased at 40%. 3. Nonspecific mid-wall LGE at the basal inferior RV insertion site and subtle nonspecific mid-wall LGE in the basal septum. This pattern can be seen with pressure/volume overload. 4.  Normal T1 and T2 parametrics. Dalton Mclean Electronically Signed   By: Ezra Shuck M.D.   On: 07/23/2024 16:51   MR CARDIAC MORPHOLOGY W WO CONTRAST Result Date: 07/23/2024 CLINICAL DATA:  Cardiomyopathy of uncertain etiology EXAM: CARDIAC MRI TECHNIQUE: The patient was scanned on a 1.5 Tesla GE magnet. A dedicated cardiac coil was used. Functional imaging was done using Fiesta sequences. 2,3, and 4 chamber views were done to assess for RWMA's. Modified Simpson's rule using a short axis stack was used to calculate an ejection fraction on a dedicated work [employer]. The patient received 10 cc of Multihance. After 10 minutes inversion recovery sequences were used to assess for infiltration and scar tissue. FINDINGS: Limited images of the lung fields showed no gross abnormalities. There were 2  small cystic lesions in the right lobe of the liver, 1.2 cm and 1 cm. Normal left ventricular size and wall thickness. Global moderate hypokinesis, LV EF 35%. Normal right ventricular size with RV EF 40%. Moderate left atrial enlargement. Mild right atrial enlargement. The aortic valve was not well-visualized. There was no significant aortic stenosis, trivial aortic insufficiency (regurgitant fraction 3%). No significant mitral regurgitation was noted. On delayed enhancement imaging, there was subtle mid-wall late gadolinium enhancement (LGE) in the basal septum and a small area of mid-wall LGE at the basal inferoseptal RV insertion site. MEASUREMENTS: MEASUREMENTS LVEDV 185 mL LVEDVi 76 mL/m2 LVSV 65 mL LVEF 35% RVEDV 209 mL RVEDVi 85 mL/m2 RVSV 84 mL RVEF 40% Aortic forward volume 72 mL Aortic regurgitant fraction 3% Global T1 1024, ECV 26% Global T2 47 (within normal limits) IMPRESSION: 1.  Normal LV size with moderate global hypokinesis, LV EF 35%. 2.  Normal RV size with RV EF decreased at 40%. 3. Nonspecific mid-wall LGE at the basal inferior RV insertion site and subtle nonspecific mid-wall LGE in the basal septum. This pattern can be seen with pressure/volume overload. 4.  Normal T1 and T2 parametrics. Dalton Mclean  Electronically Signed   By: Ezra Shuck M.D.   On: 07/23/2024 16:51    Microbiology: Results for orders placed or performed during the hospital encounter of 02/07/24  Urine Culture     Status: None   Collection Time: 02/07/24 11:12 AM   Specimen: Urine, Random  Result Value Ref Range Status   Specimen Description   Final    URINE, RANDOM Performed at [facility], [address] [place], [place] [address]    Special Requests   Final    NONE Reflexed from [id number] Performed at [facility], [address], [place], [place] [address]    Culture   Final    NO GROWTH Performed at [facility], [address] [place], [place]  [address]    Report Status 02/08/2024 FINAL  Final   *Note: Due to a large number of results and/or encounters for the requested time period, some results have not been displayed. A complete set of results can be found in Results Review.    Labs: CBC: No results for input(s): WBC, NEUTROABS, HGB, HCT, MCV, PLT in the last 168 hours. Basic Metabolic Panel: Recent Labs  Lab 08/16/24 0525  CREATININE 2.08*   Liver Function Tests: No results for input(s): AST, ALT, ALKPHOS, BILITOT, PROT, ALBUMIN  in the last 168 hours. CBG: No results for input(s): GLUCAP in the last 168 hours.  Discharge time spent: greater than 30 minutes.  Signed: [name], MD [facility] 08/21/2024

## 2024-08-21 NOTE — Discharge Instructions (Signed)
 Talk to your cardiologist or PCP regarding potassium supplementation.  You do not need potassium supplementation as your potassium level has been good

## 2024-08-21 NOTE — Plan of Care (Signed)
  Problem: Education: Goal: Knowledge of General Education information will improve Description: Including pain rating scale, medication(s)/side effects and non-pharmacologic comfort measures Outcome: Progressing   Problem: Health Behavior/Discharge Planning: Goal: Ability to manage health-related needs will improve Outcome: Progressing   Problem: Clinical Measurements: Goal: Ability to maintain clinical measurements within normal limits will improve Outcome: Progressing Goal: Will remain free from infection Outcome: Progressing Goal: Diagnostic test results will improve Outcome: Progressing Goal: Respiratory complications will improve Outcome: Progressing Goal: Cardiovascular complication will be avoided Outcome: Progressing   Problem: Coping: Goal: Level of anxiety will decrease Outcome: Progressing   Problem: Elimination: Goal: Will not experience complications related to bowel motility Outcome: Progressing Goal: Will not experience complications related to urinary retention Outcome: Progressing   Problem: Pain Managment: Goal: General experience of comfort will improve and/or be controlled Outcome: Progressing

## 2024-08-21 NOTE — Progress Notes (Signed)
 Communicated with patient via Caregility. Patient appears comfortable in the bed. AVS paperwork given to the patient. Discharge instructions discussed. Patient's question answered to satisfaction. Patient agreeable with discharge plan.

## 2024-08-22 ENCOUNTER — Telehealth: Payer: Self-pay

## 2024-08-22 DIAGNOSIS — M19071 Primary osteoarthritis, right ankle and foot: Secondary | ICD-10-CM | POA: Diagnosis not present

## 2024-08-22 DIAGNOSIS — L84 Corns and callosities: Secondary | ICD-10-CM | POA: Diagnosis not present

## 2024-08-22 DIAGNOSIS — M19072 Primary osteoarthritis, left ankle and foot: Secondary | ICD-10-CM | POA: Diagnosis not present

## 2024-08-22 DIAGNOSIS — I872 Venous insufficiency (chronic) (peripheral): Secondary | ICD-10-CM | POA: Diagnosis not present

## 2024-08-22 DIAGNOSIS — L97511 Non-pressure chronic ulcer of other part of right foot limited to breakdown of skin: Secondary | ICD-10-CM | POA: Diagnosis not present

## 2024-08-22 DIAGNOSIS — I70203 Unspecified atherosclerosis of native arteries of extremities, bilateral legs: Secondary | ICD-10-CM | POA: Diagnosis not present

## 2024-08-22 DIAGNOSIS — B351 Tinea unguium: Secondary | ICD-10-CM | POA: Diagnosis not present

## 2024-08-22 DIAGNOSIS — L97521 Non-pressure chronic ulcer of other part of left foot limited to breakdown of skin: Secondary | ICD-10-CM | POA: Diagnosis not present

## 2024-08-22 NOTE — Transitions of Care (Post Inpatient/ED Visit) (Signed)
   08/22/2024  Name: ALDRIN ENGELHARD MRN: [id number] DOB: 06-22-1946  Today's TOC FU Call Status: Today's TOC FU Call Status:: Unsuccessful Call (1st Attempt) Unsuccessful Call (1st Attempt) Date: 08/22/24  Attempted to reach the patient regarding the most recent Inpatient/ED visit.  Follow Up Plan: Additional outreach attempts will be made to reach the patient to complete the Transitions of Care (Post Inpatient/ED visit) call.   Arvin Seip RN, BSN, CCM [employer], Population Health Case Manager Phone: [phone]

## 2024-08-22 NOTE — Progress Notes (Signed)
 08/22/2024 at 10:38 am  Pts spouse called that the outpatient therapy they wanted to use has declined to see him due to his needs. They recommended SNF vs home health. Pt and spouse asking for [facility] services at home. They have used [facility] in the past and in agreement with using them again. Referral sent to [facility] with verbal orders from Dr Drusilla for [facility] PT/OT. Hedda will contact them for the first home visit.

## 2024-08-23 ENCOUNTER — Telehealth: Payer: Self-pay | Admitting: *Deleted

## 2024-08-23 NOTE — Telephone Encounter (Signed)
 Per Dr Shlomo, he has to try cpap for 6 months to qualify for the inspire device. Reached out to patient [name].

## 2024-08-23 NOTE — Transitions of Care (Post Inpatient/ED Visit) (Signed)
 08/23/2024  Name: [name] MRN: [id number] DOB: 1946/10/09  Today's TOC FU Call Status: Today's TOC FU Call Status:: Successful TOC FU Call Completed TOC FU Call Complete Date: 08/23/24  Patient's Name and Date of Birth confirmed. Name, DOB (per spouse/ caregiver [name], verified on [facility] DPR)  Transition Care Management Follow-up Telephone Call Date of Discharge: 08/21/24 Discharge Facility: [facility]) Type of Discharge: Inpatient Admission Primary Inpatient Discharge Diagnosis:: (R) lower extremity weakness; possible TIA How have you been since you were released from the hospital?: Better (per spouse: He is fine now; using his walker.  Independent in taking care of himself except for his medications which I handle.  I don't want weekly calls to check on him- he is fine and I know to call the doctor if anything comes up) Any questions or concerns?: Yes Patient Questions/Concerns:: per spouse: reports that patient declined home health services for PT at time of hospital discharge- stated patient was planning to return to outpatient PT as he had prior to recent admission; reports today we were told the outpatient PT can no longer see him, so now we do want home health services; Patient Questions/Concerns Addressed: Other: (Provided education around how to obtain/ request home health services: explained needs physician order; facilitated scheduling of hospital follow up office visit; offered enrollment into TOC 30-day program, however caregiver declined, stated no need)  Items Reviewed: Did you receive and understand the discharge instructions provided?: Yes (thoroughly reviewed with patient's spouse who verbalizes good understanding of same) Medications obtained,verified, and reconciled?: Partial Review Completed Reason for Partial Mediation Review: Spouse declined full review: I am standing in line at the post-office right now and don't have his list in front of me; I  handle all of his medications and am very familiar with all of them- I went over all of the changes and the one new medicine last night and have no questions and have done everything just like they told us  to Any new allergies since your discharge?: No Dietary orders reviewed?: Yes Type of Diet Ordered:: per spouse/ caregiver: Heart Healthy; I am working on decreasing his salt intake Do you have support at home?: Yes People in Home [RPT]: spouse Name of Support/Comfort Primary Source: Spouse reports patient is independent in self-care activities; supportive spouse supervises/ assists as/ if needed/ indicated  Medications Reviewed Today: Medications Reviewed Today     Reviewed by Georgene Kopper M, RN (Registered Nurse) on 08/23/24 at 1611  Med List Status: <None>   Medication Order Taking? Sig Documenting Provider Last Dose Status Informant  acetaminophen  (TYLENOL ) 325 MG tablet [id number]  Take 2 tablets (650 mg total) by mouth every 6 (six) hours as needed for mild pain (or Fever >/= 101). Sheikh, Omair Aubrey, DO  Active Spouse/Significant Other, Self  acyclovir  (ZOVIRAX ) 400 MG tablet [id number]  Take 1 tablet (400 mg total) by mouth 2 (two) times daily. Plotnikov, Aleksei V, MD  Active Spouse/Significant Other, Self  albuterol  (VENTOLIN  HFA) 108 (90 Base) MCG/ACT inhaler [id number]  Inhale 2 puffs into the lungs every 6 (six) hours as needed for wheezing or shortness of breath. [provider]  Active Spouse/Significant Other, Self  alfuzosin  (UROXATRAL ) 10 MG 24 hr tablet [id number]  TAKE 1 TABLET AT BEDTIME McKenzie, Belvie CROME, MD  Active Spouse/Significant Other, Self  allopurinol  (ZYLOPRIM ) 100 MG tablet [id number]  TAKE 1 TABLET EVERY DAY Plotnikov, Aleksei V, MD  Active Spouse/Significant Other, Self  amoxicillin  (AMOXIL ) 500 MG capsule [id number]  Take 2,000 mg by mouth daily as needed (for dental procedures). [provider]  Active Self, Spouse/Significant Other  aspirin   81 MG chewable tablet [id number] Yes Chew 1 tablet (81 mg total) by mouth daily. Drusilla Sabas RAMAN, MD  Active   atorvastatin  (LIPITOR ) 40 MG tablet [id number] Yes Take 1 tablet (40 mg total) by mouth daily. Drusilla Sabas RAMAN, MD  Active   buPROPion  (WELLBUTRIN  XL) 300 MG 24 hr tablet [id number]  TAKE 1 TABLET EVERY DAY WITH BREAKFAST Plotnikov, Aleksei V, MD  Active Spouse/Significant Other, Self  Calcium  Citrate-Vitamin D  (CALCIUM  CITRATE + D PO) [id number]  Take 1 tablet by mouth 2 (two) times daily. 1200mg  of Calcium  and 1000 units vitamin D3 [provider]  Active Spouse/Significant Other, Self           Med Note DORI, FELICIA D   Sat Feb 12, 2017  1:35 AM)    cephALEXin  (KEFLEX ) 250 MG capsule [id number]  Take 250 mg by mouth daily. [provider]  Active Self, Spouse/Significant Other           Med Note (SATTERFIELD, DARIUS E   Thu Aug 09, 2024  7:41 PM) Continues. For prevention UTI per spouse   Cholecalciferol  (VITAMIN D ) 50 MCG (2000 UT) tablet [id number]  Take 4,000 Units by mouth in the morning and at bedtime. [provider]  Active Spouse/Significant Other, Self  clonazePAM  (KLONOPIN ) 1 MG tablet [id number]  TAKE 1 TABLET THREE TIMES DAILY AS NEEDED FOR ANXIETY Plotnikov, Karlynn GAILS, MD  Active Spouse/Significant Other, Self  CRANBERRY PO [id number]  Take 1 tablet by mouth in the morning and at bedtime. [provider]  Active Spouse/Significant Other, Self  denosumab  (PROLIA ) 60 MG/ML SOSY injection [id number]  Inject 60 mg as directed every 6 (six) months. [provider]  Active Spouse/Significant Other, Self           Med Note TORRIE, LONELL CROME   Fri Jun 11, 2022  8:36 AM)    DULoxetine  (CYMBALTA ) 60 MG capsule [id number]  TAKE 1 CAPSULE TWICE DAILY Plotnikov, Aleksei V, MD  Active Spouse/Significant Other, Self  EPINEPHrine  0.3 mg/0.3 mL IJ SOAJ injection [id number]  Inject 0.3 mg into the muscle once as needed for anaphylaxis (severe allergic  reaction). [provider]  Active Spouse/Significant Other, Self           Med Note ANNELL, JEANNINE HERO   Wed Aug 08, 2024 10:18 AM)    Evolocumab (REPATHA SURECLICK) 140 MG/ML SOAJ [id number]  Inject 140 mg into the skin every 14 (fourteen) days. Vannie Reche RAMAN, NP  Active Self, Spouse/Significant Other  ezetimibe  (ZETIA ) 10 MG tablet [id number]  Take 1 tablet (10 mg total) by mouth daily. Plotnikov, Aleksei V, MD  Active Spouse/Significant Other, Self  ferrous sulfate  325 (65 FE) MG tablet [id number]  Take 325 mg by mouth 2 (two) times daily with a meal. [provider]  Active Spouse/Significant Other, Self  finasteride (PROSCAR) 5 MG tablet [id number]  Take 5 mg by mouth daily. [provider]  Active Spouse/Significant Other, Self  levothyroxine  (SYNTHROID ) 125 MCG tablet [id number]  TAKE 1 TABLET EVERY DAY AT 6AM Plotnikov, Aleksei V, MD  Active Self, Spouse/Significant Other  loratadine  (CLARITIN ) 10 MG tablet [id number]  Take 10 mg by mouth daily. [provider]  Active Spouse/Significant Other, Self  losartan  (COZAAR ) 25 MG tablet [id number]  Take 0.5 tablets (12.5 mg total) by mouth every evening. Walker, Caitlin S, NP  Active Self,  Spouse/Significant Other  metoprolol  succinate (TOPROL  XL) 25 MG 24 hr tablet [id number]  Take 1 tablet (25 mg total) by mouth daily. [place], Harlene HERO, FNP  Active Self, Spouse/Significant Other  Multiple Vitamin (MULTIVITAMIN WITH MINERALS) TABS tablet [id number]  Take 1 tablet by mouth every morning. Men's One-A-Day 50+ [provider]  Active Spouse/Significant Other, Self           Med Note DORI, FELICIA D   Sat Feb 12, 2017  1:34 AM)    MYRBETRIQ  25 MG TB24 tablet [id number]  TAKE 1 TABLET EVERY DAY McKenzie, Belvie CROME, MD  Active Spouse/Significant Other, Self  nitroGLYCERIN  (NITROSTAT ) 0.4 MG SL tablet [id number]  Place 0.4 mg under the tongue every 5 (five) minutes as needed for chest pain. [provider]   Active Spouse/Significant Other, Self  omeprazole  (PRILOSEC) 40 MG capsule [id number]  TAKE 1 CAPSULE EVERY DAY BEFORE BREAKFAST Plotnikov, Aleksei V, MD  Active Spouse/Significant Other, Self  Potassium Citrate 15 MEQ (1620 MG) TBCR [id number]  TAKE 1 TABLET IN THE MORNING AND AT BEDTIME McKenzie, Belvie CROME, MD  Active Spouse/Significant Other, Self  Probiotic Product (PROBIOTIC PO) [id number]  Take 1 capsule by mouth daily with breakfast. [provider]  Active Spouse/Significant Other, Self           Med Note DORI, FELICIA D   Sat Feb 12, 2017  1:34 AM)    SKYRIZI PEN 150 MG/ML pen [id number]  Inject 150 mg into the skin every 3 (three) months. [provider]  Active Spouse/Significant Other, Self           Med Note (SATTERFIELD, TEENA BRAVO   Thu Aug 09, 2024  7:39 PM) Next dose December 25th 2025 per patient and spouse   solifenacin (VESICARE) 10 MG tablet [id number]  Take 10 mg by mouth daily. [provider]  Active Spouse/Significant Other, Self  testosterone  enanthate (DELATESTRYL ) 200 MG/ML injection [id number]  Inject 1 mL (200 mg total) into the muscle every 14 (fourteen) days. Every other Friday Plotnikov, Aleksei V, MD  Active Spouse/Significant Other, Self  vitamin C  (ASCORBIC ACID ) 500 MG tablet [id number]  Take 500 mg by mouth daily with lunch. [provider]  Active Spouse/Significant Other, Self           Med Note DORI, FELICIA D   Sat Feb 12, 2017  1:34 AM)             Home Care and Equipment/Supplies: Were Home Health Services Ordered?: No Any new equipment or medical supplies ordered?: No  Functional Questionnaire: Do you need assistance with bathing/showering or dressing?: No (spouse supervises assists as-if indicated with showers) Do you need assistance with meal preparation?: No (spouse supervises assists as-if indicated) Do you need assistance with eating?: No Do you have difficulty maintaining continence: No Do you need assistance  with getting out of bed/getting out of a chair/moving?: No Do you have difficulty managing or taking your medications?: Yes (spouse manages all aspects of medication administration)  Follow up appointments reviewed: PCP Follow-up appointment confirmed?: Yes (care coordination outreach in real-time with scheduling care guide to successfully schedule hospital follow up PCP appointment 09/04/24) Date of PCP follow-up appointment?: 09/04/24 (scheduling care guide reports this is first available appointment for anyone in the practice) Follow-up Provider: PCP- [facility] Follow-up appointment confirmed?: Yes Date of Specialist follow-up appointment?: 08/31/24 Follow-Up Specialty Provider:: cardiology provider Do you need transportation to your follow-up appointment?: No Do you understand care options  if your condition(s) worsen?: Yes-patient verbalized understanding  SDOH Interventions Today    Flowsheet Row Most Recent Value  SDOH Interventions   Food Insecurity Interventions Intervention Not Indicated  [per spouse/ caregiver]  Housing Interventions Intervention Not Indicated  [per spouse/ caregiver]  Transportation Interventions Intervention Not Indicated  [per spouse/ caregiver: he provides all transportation for patient]  Utilities Interventions Intervention Not Indicated  [per spouse/ caregiver]   See TOC assessment tabs for additional assessment/ TOC intervention information Confirmed patient is NOT currently obtaining daily weights at home: has plans to start tomorrow; provided education / rationale for daily weight monitoring at home as earliest indicator of fluid retention, along with weight gain guidelines/ action plan for weight gain; signs and symptoms other than weight gain that indicate fluid retention  advised patient to take all recorded weights from home monitoring to upcoming cardiology provider office visit 08/31/24 for provider review Provided  education/ reinforcement around fall prevention  Provided education around how to obtain/ request home health services: explained needs physician order; facilitated scheduling of hospital follow up office visit; offered enrollment into TOC 30-day program, however caregiver declined, stated no need   Patient's spouse/ caregiver declines need for ongoing/ further care management/ coordination outreach; declines enrollment in 30-day TOC program- declines taking my direct phone number should needs/ concerns arise post-TOC call   Pls call/ message for questions,  Taniyah Ballow Mckinney Aylin Rhoads, RN, BSN, Media Planner  Transitions of Care  [facility] [phone]: direct office

## 2024-08-23 NOTE — Telephone Encounter (Signed)
 Return call: Patient spouse called back to say the patient previously was on a cpap for 6 months and stopped using it 6 months to a year ago.

## 2024-08-25 ENCOUNTER — Emergency Department (HOSPITAL_COMMUNITY)
Admission: EM | Admit: 2024-08-25 | Discharge: 2024-08-25 | Disposition: A | Discharge status: Home / Self Care | Attending: Emergency Medicine | Admitting: Emergency Medicine

## 2024-08-25 ENCOUNTER — Other Ambulatory Visit: Payer: Self-pay | Admitting: Internal Medicine

## 2024-08-25 ENCOUNTER — Other Ambulatory Visit: Payer: Self-pay

## 2024-08-25 ENCOUNTER — Encounter (HOSPITAL_COMMUNITY): Payer: Self-pay | Admitting: Emergency Medicine

## 2024-08-25 DIAGNOSIS — W01198A Fall on same level from slipping, tripping and stumbling with subsequent striking against other object, initial encounter: Secondary | ICD-10-CM | POA: Insufficient documentation

## 2024-08-25 DIAGNOSIS — W19XXXA Unspecified fall, initial encounter: Secondary | ICD-10-CM

## 2024-08-25 DIAGNOSIS — Y92009 Unspecified place in unspecified non-institutional (private) residence as the place of occurrence of the external cause: Secondary | ICD-10-CM | POA: Diagnosis not present

## 2024-08-25 DIAGNOSIS — S51011A Laceration without foreign body of right elbow, initial encounter: Secondary | ICD-10-CM | POA: Insufficient documentation

## 2024-08-25 DIAGNOSIS — I2510 Atherosclerotic heart disease of native coronary artery without angina pectoris: Secondary | ICD-10-CM | POA: Diagnosis not present

## 2024-08-25 DIAGNOSIS — I959 Hypotension, unspecified: Secondary | ICD-10-CM | POA: Diagnosis not present

## 2024-08-25 DIAGNOSIS — Z7982 Long term (current) use of aspirin: Secondary | ICD-10-CM | POA: Diagnosis not present

## 2024-08-25 DIAGNOSIS — S59901A Unspecified injury of right elbow, initial encounter: Secondary | ICD-10-CM | POA: Diagnosis present

## 2024-08-25 NOTE — ED Provider Notes (Signed)
  EMERGENCY DEPARTMENT AT [facility] Provider Note   CSN: [id number] Arrival date & time: 08/25/24  2134     Patient presents with: [name] is a 78 y.o. male.   Pt is a 78 y.o. male with medical history significant of history of relapsed large B-cell lymphoma, coronary artery disease, recurrent UTIs, atrial fibrillation (not on blood thinner due to bleeding/hematuria), chronic systolic heart failure who was recently admitted to the hospital for right leg weakness concern for TIA initially but then concern for possible lumbar pathology who was felt could have outpatient follow-up and reports overall his leg was getting stronger and he went home 2 days ago.  Patient is returning to the emergency room tonight due to a fall.  He reports that today he went to take a shower.  He did well in the shower but as he was getting out of the shower he stepped on the shower mat which then slipped out from underneath him causing him to fall to the floor.  He was unable to get up from the floor so he scooted over to his Jacuzzi tub and was pulling himself over the tub when his legs kept slipping on the wet floor and he slid to the floor again.  He denies hitting his head or having any loss of consciousness.  He was feeling great prior to this and reports he has been doing well since getting home 2 days ago.  He states his right leg is still shaky but it is overall improved.  He has no injury from his fall.  He denies feeling weak or dizzy prior to the fall.  He has been compliant with his medications including the antibiotic he was given prior to discharge.  He did get a skin tear on his right arm but denies any other injury.  No leg pain, back pain, extremity pain.  He denies any chest pain or shortness of breath.  The history is provided by the patient and medical records.  Fall       Prior to Admission medications   Medication Sig Start Date End Date Taking? Authorizing  Provider  acetaminophen  (TYLENOL ) 325 MG tablet Take 2 tablets (650 mg total) by mouth every 6 (six) hours as needed for mild pain (or Fever >/= 101). 03/25/22   Sherrill Cable Latif, DO  acyclovir  (ZOVIRAX ) 400 MG tablet Take 1 tablet (400 mg total) by mouth 2 (two) times daily. 05/28/24   Plotnikov, Karlynn GAILS, MD  albuterol  (VENTOLIN  HFA) 108 (90 Base) MCG/ACT inhaler Inhale 2 puffs into the lungs every 6 (six) hours as needed for wheezing or shortness of breath.    [provider]  alfuzosin  (UROXATRAL ) 10 MG 24 hr tablet TAKE 1 TABLET AT BEDTIME 04/03/24   McKenzie, Belvie CROME, MD  allopurinol  (ZYLOPRIM ) 100 MG tablet TAKE 1 TABLET EVERY DAY 01/31/24   Plotnikov, Aleksei V, MD  amoxicillin  (AMOXIL ) 500 MG capsule Take 2,000 mg by mouth daily as needed (for dental procedures).    [provider]  aspirin  81 MG chewable tablet Chew 1 tablet (81 mg total) by mouth daily. 08/22/24   Drusilla Sabas RAMAN, MD  atorvastatin  (LIPITOR ) 40 MG tablet Take 1 tablet (40 mg total) by mouth daily. 08/22/24   Drusilla Sabas RAMAN, MD  buPROPion  (WELLBUTRIN  XL) 300 MG 24 hr tablet TAKE 1 TABLET EVERY DAY WITH BREAKFAST 12/19/23   Plotnikov, Aleksei V, MD  Calcium  Citrate-Vitamin D  (CALCIUM  CITRATE +  D PO) Take 1 tablet by mouth 2 (two) times daily. 1200mg  of Calcium  and 1000 units vitamin D3    [provider]  cephALEXin  (KEFLEX ) 250 MG capsule Take 250 mg by mouth daily.    [provider]  Cholecalciferol  (VITAMIN D ) 50 MCG (2000 UT) tablet Take 4,000 Units by mouth in the morning and at bedtime.    [provider]  clonazePAM  (KLONOPIN ) 1 MG tablet TAKE 1 TABLET THREE TIMES DAILY AS NEEDED FOR ANXIETY 04/12/24   Plotnikov, Aleksei V, MD  CRANBERRY PO Take 1 tablet by mouth in the morning and at bedtime.    [provider]  denosumab  (PROLIA ) 60 MG/ML SOSY injection Inject 60 mg as directed every 6 (six) months. 12/10/15   [provider]  DULoxetine  (CYMBALTA ) 60 MG  capsule TAKE 1 CAPSULE TWICE DAILY 01/16/24   Plotnikov, Aleksei V, MD  EPINEPHrine  0.3 mg/0.3 mL IJ SOAJ injection Inject 0.3 mg into the muscle once as needed for anaphylaxis (severe allergic reaction). 07/31/21   [provider]  Evolocumab (REPATHA SURECLICK) 140 MG/ML SOAJ Inject 140 mg into the skin every 14 (fourteen) days. 07/26/24   Vannie Reche RAMAN, NP  ezetimibe  (ZETIA ) 10 MG tablet Take 1 tablet (10 mg total) by mouth daily. 05/28/24   Plotnikov, Karlynn GAILS, MD  ferrous sulfate  325 (65 FE) MG tablet Take 325 mg by mouth 2 (two) times daily with a meal.    [provider]  finasteride (PROSCAR) 5 MG tablet Take 5 mg by mouth daily. 09/25/23   [provider]  levothyroxine  (SYNTHROID ) 125 MCG tablet TAKE 1 TABLET EVERY DAY AT 6AM 08/08/24   Plotnikov, Aleksei V, MD  loratadine  (CLARITIN ) 10 MG tablet Take 10 mg by mouth daily.    [provider]  losartan  (COZAAR ) 25 MG tablet Take 0.5 tablets (12.5 mg total) by mouth every evening. 07/16/24 10/14/24  Vannie Reche RAMAN, NP  metoprolol  succinate (TOPROL  XL) 25 MG 24 hr tablet Take 1 tablet (25 mg total) by mouth daily. 08/08/24   Glena Harlene HERO, FNP  Multiple Vitamin (MULTIVITAMIN WITH MINERALS) TABS tablet Take 1 tablet by mouth every morning. Men's One-A-Day 50+    [provider]  MYRBETRIQ  25 MG TB24 tablet TAKE 1 TABLET EVERY DAY 10/06/21   McKenzie, Belvie CROME, MD  nitroGLYCERIN  (NITROSTAT ) 0.4 MG SL tablet Place 0.4 mg under the tongue every 5 (five) minutes as needed for chest pain.    [provider]  omeprazole  (PRILOSEC) 40 MG capsule TAKE 1 CAPSULE EVERY DAY BEFORE BREAKFAST 01/16/24   Plotnikov, Aleksei V, MD  Potassium Citrate 15 MEQ (1620 MG) TBCR TAKE 1 TABLET IN THE MORNING AND AT BEDTIME 02/21/24   McKenzie, Belvie CROME, MD  Probiotic Product (PROBIOTIC PO) Take 1 capsule by mouth daily with breakfast.    [provider]  SKYRIZI PEN 150 MG/ML pen Inject 150 mg into  the skin every 3 (three) months. 09/10/23   [provider]  solifenacin (VESICARE) 10 MG tablet Take 10 mg by mouth daily.    [provider]  testosterone  enanthate (DELATESTRYL ) 200 MG/ML injection Inject 1 mL (200 mg total) into the muscle every 14 (fourteen) days. Every other Friday 06/13/24   Plotnikov, Aleksei V, MD  vitamin C  (ASCORBIC ACID ) 500 MG tablet Take 500 mg by mouth daily with lunch.    [provider]    Allergies: Short ragweed pollen ext    Review of Systems  Updated  Vital Signs BP 95/82   Pulse 86   Temp 98.2 F (36.8 C) (Oral)   Resp 19   Ht 6' 4 (1.93 m)   Wt 115 kg   SpO2 99%   BMI 30.86 kg/m   Physical Exam Vitals and nursing note reviewed.  Constitutional:      General: He is not in acute distress.    Appearance: He is well-developed.  HENT:     Head: Normocephalic and atraumatic.  Eyes:     Conjunctiva/sclera: Conjunctivae normal.     Pupils: Pupils are equal, round, and reactive to light.  Neck:     Comments: No neck tenderness Cardiovascular:     Rate and Rhythm: Normal rate. Rhythm irregular.     Heart sounds: No murmur heard. Pulmonary:     Effort: Pulmonary effort is normal. No respiratory distress.     Breath sounds: Normal breath sounds. No wheezing or rales.  Abdominal:     General: There is no distension.     Palpations: Abdomen is soft.     Tenderness: There is no abdominal tenderness. There is no guarding or rebound.  Musculoskeletal:        General: No tenderness. Normal range of motion.     Cervical back: Normal range of motion and neck supple.     Comments: 2 small skin tears noted on the right forearm.  No upper or lower extremity pain  Skin:    General: Skin is warm and dry.     Findings: No erythema or rash.  Neurological:     Mental Status: He is alert and oriented to person, place, and time. Mental status is at baseline.     Comments: 5 out of 5 strength in bilateral upper and lower  extremities.  Psychiatric:        Mood and Affect: Mood normal.        Behavior: Behavior normal.     (all labs ordered are listed, but only abnormal results are displayed) Labs Reviewed - No data to display  EKG: None  Radiology: No results found.   Procedures   Medications Ordered in the ED - No data to display                                  Medical Decision Making  Pt with multiple medical problems and comorbidities and presenting today with a complaint that caries a high risk for morbidity and mortality.  Presenting today after a fall at home.  Small skin tear to the right forearm but does not appear to have sustained any other injuries.  Patient is in A-fib but rate is between 80 and low 100s.  Patient is not on any anticoagulation.  Denies any head injury.  He is well-appearing on exam and reports he would like to go home.  He was recently hospitalized had lab work done within the last week shows stable labs.  Low suspicion for new acute anemia.  Will ensure patient is able to stand and walk however fall seems mechanical when the rug slipped out from underneath him.  He has had a long history of mobility issues and has a walker at home.  Repeat blood pressure is in the low 100s which appears to be his baseline based on his recent hospitalization.  No indication for brain imaging at this time or x-rays.  Patient does not desire to have blood work done  at this time does not appear that it is necessary given recent workup and discharge.  Patient was able to walk with a walker and at this time feel that he is stable for discharge.      Final diagnoses:  Fall, initial encounter  Skin tear of right elbow without complication, initial encounter    ED Discharge Orders     None          Doretha Folks, MD 08/25/24 2313

## 2024-08-25 NOTE — Discharge Instructions (Signed)
 You can just place a bandage on the area of skin that got tore and it should heal with time.  Otherwise return if you start having dizzy spells, chest pain, shortness of breath or trouble moving your legs.

## 2024-08-25 NOTE — ED Notes (Signed)
 PT walked without distress using walker.

## 2024-08-25 NOTE — ED Triage Notes (Signed)
 Patient from home after having a mechanical fall slipping on a rug. His right foot got stuck on the rug.  He denies any pain currently, no LOC. Was taking xarelto  but stopped taking it 6 months ago. The fall caused 2 small skin tears on the R elbow.    VS w/ EMS: BP 100/64 99% Spo2 on RA HR 98

## 2024-08-25 NOTE — ED Notes (Signed)
 2 half dollar size skin tears on R forearm cleaned with normal saline and dressed with tegaderm.

## 2024-08-28 ENCOUNTER — Other Ambulatory Visit: Payer: Self-pay | Admitting: Internal Medicine

## 2024-08-28 DIAGNOSIS — Z9181 History of falling: Secondary | ICD-10-CM

## 2024-08-28 DIAGNOSIS — M79604 Pain in right leg: Secondary | ICD-10-CM

## 2024-08-28 NOTE — Progress Notes (Signed)
 Advanced Heart Failure Clinic Note   HPI:  [name] is a 78 y.o. male with a hx of relapsed large B-cell lymphoma, CAD, recurrent UTI, atrial fibrillation,OSA and systolic HF.   Initially diagnosed with atrial fibrillation in 2001. Amiodarone  discontinued due to persistent atrial fibrillation.  Underwent LHC  with reported 90% obstructive of unknown vessel that developed collaterals.  He was started on Ranexa .  Echo 10/2016 EF 60 to 65%, severe bilateral atrial enlargement, IVC dilated.  He had no angina so Ranexa  discontinued.   Diagnosed with diffuse large B-cell lymphoma 05/2018.  Completed 6 cycles of R-CHOP chemotherapy.  Based on echo 06/2018 EF 50 to 55%, normal diastolic function, apical hypokinesis.  LHC 08/2019 significant two-vessel CAD sequential 60 to 70% mid LAD lesions and CTO of mid RCA with bridging left-to-right collaterals.  Consideration for two-vessel CABG versus medical management.  Deemed not candidate for surgery.     03/22/22 chemo resumed due to recurrence of diffuse large b-cell lymphoma. Treated with R-GDP   Admitted 04/2022 with urosepsis due to E. coli ESBL.    Echo 08/2022 EF 35-40%   He has been off Xarelto  since 10/18/22 due to hematuria, frequent falls as risks outweighed benefits.    At visit 12/09/23 he was undergoing Epcoritamab  bispecific for refractory DLBCL with [facility] oncology.  Updated echo 05/23/24 showed reduced LVEF 25-30% (previously 35-40%), moderate RV dysfunction. He was recommended for 3 day ZIO to assess rate control of atrial fibrillation.    Zio 8/25 chronic AF avg 77 bpm   [facility] 9/25 showed stable 2v CAD; moderately elevated filling pressures, mild to moderate PH, mildly reduced CI.   cMRI 10/25 showed LVEF 35%, RVEF 40%, nonspecific mid wall LGE; NICM  Admitted from 10/29-11/11 with right leg weakness with concern for TIA. No stroke found on MRI. Echo 08/10/2024 EF 35-40%, mildly reduced RV function. Attempted discharge to CIR but insurance  denied. He was discharged home.   Back in the ED on 11/15 after a mechanical fall from slipping on the shower mat.    Today he returns to HF clinic with his husband for pharmacist medication titration. At last visit with NP on 10/29, he was feeling fine and volume stable (ReDS 30%). His lopressor  was transitioned to Toprol . He has been feeling well. Denies shortness of breath, orthopnea, PND, lightheadedness, or dizziness. No LE edema. Appetite is good. Denies any changes with his weakness. Weight stable from last visit. Has a scale at home but is scared to weigh himself at home due to balance issues. Reviewed a technique on how to use the scale with assistance from a walker. Verbalized understanding and will ask PT for additional support if needed. He has a blood pressure cuff at home as well but has not been using it routinely. Instructed that he should start taking this around the same time every day to monitor at home. He does not think low BP is the cause of his weakness leading to falls. He is following a low sodium and fluid restricted diet. He has not taken the metoprolol  today as he normally takes it daily around 2pm and it conflicted with this appt.   HF Medications: Metoprolol  XL 25 mg daily at lunchtime - last taken 11/18 Losartan  12.5 mg every evening - last taken 11/18  Has the patient been experiencing any side effects to the medications prescribed?  no  Does the patient have any problems obtaining medications due to transportation or finances?  No - has [employer]   Understanding of regimen: good Understanding of indications: good Potential of compliance: good - his husband assists with his medications  Patient understands to avoid NSAIDs. Patient understands to avoid decongestants.    Pertinent Lab Values: As of 11/4: Serum creatinine 1.96, BUN 33, Potassium 4.0, Sodium 135  Vital Signs: Weight: 254 lbs (last clinic weight: 253 lbs) Blood pressure: 120/82 mmHg  Heart  rate: 87 bpm   Assessment/Plan: 1. Chronic systolic CHF (EF 64-59%), due to NICM. NYHA class I-II symptoms. - Echo 06/2019 LVEF 50-55% ? Echo 08/2022 LVEF 35-40% ? 05/2024 LVEF 25-30% ? 07/2024 LVEF 35-40%.  - Cath 2020 with 2v CAD -> medical RX. EF normal at time - [facility] (9/25) stable 2v CAD, mildly elevated filling pressures - Suspect possible component of chemotoxicity (R-CHOP) - cMRI (10/25): LVEF 35%, RVEF 40%, nonspecific mid wall LGE; NICM - NYHA II, volume OK - Continue metoprolol  XL 25 mg daily. Consider adjusting dose at next appt with [facility]. He did not take this medication yet today and without home monitoring do not feel comfortable increasing yet. He was instructed to take this prior to his appt with [facility] on Friday. - Continue losartan  12.5 daily. Consider increasing pending blood pressure on Friday when it reflects both losartan  and metoprolol  in office.  - No spironolactone with hyperkalemia - No SGLT2i with frequent UTIs - Repeat echo in 2-3 months. - Basic disease state pathophysiology, medication indication, mechanism and side effects reviewed at length with patient and he verbalized understanding  2. Chronic AF - Rate controlled - Off AC due to bleeds   3. Recurrent B cell lymphoma  - s/p R-CHOP 2019 - s/p RGDP 2023 - recently treated with Epcoritamab     4. CAD - 2v CAD on cath 2020 - Cath 9/25: stable 2v CAD - No chest pain - Continue statin + Zetia  - management per [facility]   5. OSA - prior sleep study >10 years ago.  - Last used CPAP 2-3 years ago.  - Sleep study 10/25: severe OSA, AHI 30.7/hr - Unable to tolerate CPAP, recently found out he was approved for Inspire  Follow up [facility] 11/21 and in 2 months with Dr. Bensimhon. Scheduler will call when that becomes available.   Duwaine Plant, PharmD, BCPS Heart Failure Clinic Pharmacist [phone]

## 2024-08-29 ENCOUNTER — Ambulatory Visit (HOSPITAL_COMMUNITY)
Admission: RE | Admit: 2024-08-29 | Discharge: 2024-08-29 | Disposition: A | Discharge status: Home / Self Care | Source: Ambulatory Visit | Attending: Cardiology | Admitting: Cardiology

## 2024-08-29 VITALS — BP 120/82 | HR 87 | Wt 254.0 lb

## 2024-08-29 DIAGNOSIS — G4733 Obstructive sleep apnea (adult) (pediatric): Secondary | ICD-10-CM | POA: Insufficient documentation

## 2024-08-29 DIAGNOSIS — Z87440 Personal history of urinary (tract) infections: Secondary | ICD-10-CM | POA: Diagnosis not present

## 2024-08-29 DIAGNOSIS — Z9221 Personal history of antineoplastic chemotherapy: Secondary | ICD-10-CM | POA: Diagnosis not present

## 2024-08-29 DIAGNOSIS — I482 Chronic atrial fibrillation, unspecified: Secondary | ICD-10-CM | POA: Diagnosis not present

## 2024-08-29 DIAGNOSIS — Z9181 History of falling: Secondary | ICD-10-CM | POA: Diagnosis not present

## 2024-08-29 DIAGNOSIS — N390 Urinary tract infection, site not specified: Secondary | ICD-10-CM | POA: Insufficient documentation

## 2024-08-29 DIAGNOSIS — I5022 Chronic systolic (congestive) heart failure: Secondary | ICD-10-CM | POA: Insufficient documentation

## 2024-08-29 DIAGNOSIS — C8330 Diffuse large B-cell lymphoma, unspecified site: Secondary | ICD-10-CM | POA: Diagnosis not present

## 2024-08-29 DIAGNOSIS — Z79899 Other long term (current) drug therapy: Secondary | ICD-10-CM | POA: Insufficient documentation

## 2024-08-29 DIAGNOSIS — I2510 Atherosclerotic heart disease of native coronary artery without angina pectoris: Secondary | ICD-10-CM | POA: Diagnosis not present

## 2024-08-29 DIAGNOSIS — I428 Other cardiomyopathies: Secondary | ICD-10-CM | POA: Diagnosis not present

## 2024-08-29 NOTE — Patient Instructions (Addendum)
 It was a pleasure seeing you today!  MEDICATIONS: -No medication changes today -Take your metoprolol  BEFORE your next appt with Caitlin Walker on Friday -Call if you have questions about your medications.   NEXT APPOINTMENT: Return to clinic in January/February with Dr. Bensimhon. We will call you when that schedule is available.  In general, to take care of your heart failure: -Limit your fluid intake to 2 Liters (half-gallon) per day.   -Limit your salt intake to ideally 2-3 grams (2000-3000 mg) per day. -Weigh yourself daily and record, and bring that weight diary to your next appointment.  (Weight gain of 2-3 pounds in 1 day typically means fluid weight.) -The medications for your heart are to help your heart and help you live longer.   -Please contact us  before stopping any of your heart medications.  Call the clinic at [phone] with questions or to reschedule future appointments.

## 2024-08-31 ENCOUNTER — Ambulatory Visit (INDEPENDENT_AMBULATORY_CARE_PROVIDER_SITE_OTHER): Admitting: Family

## 2024-08-31 ENCOUNTER — Encounter (HOSPITAL_BASED_OUTPATIENT_CLINIC_OR_DEPARTMENT_OTHER): Payer: Self-pay | Admitting: Family

## 2024-08-31 VITALS — BP 106/70 | HR 66 | Ht 76.0 in | Wt 257.5 lb

## 2024-08-31 DIAGNOSIS — I5042 Chronic combined systolic (congestive) and diastolic (congestive) heart failure: Secondary | ICD-10-CM | POA: Diagnosis not present

## 2024-08-31 DIAGNOSIS — I10 Essential (primary) hypertension: Secondary | ICD-10-CM

## 2024-08-31 DIAGNOSIS — I25118 Atherosclerotic heart disease of native coronary artery with other forms of angina pectoris: Secondary | ICD-10-CM | POA: Diagnosis not present

## 2024-08-31 DIAGNOSIS — E785 Hyperlipidemia, unspecified: Secondary | ICD-10-CM | POA: Diagnosis not present

## 2024-08-31 DIAGNOSIS — I4811 Longstanding persistent atrial fibrillation: Secondary | ICD-10-CM

## 2024-08-31 DIAGNOSIS — G4733 Obstructive sleep apnea (adult) (pediatric): Secondary | ICD-10-CM | POA: Diagnosis not present

## 2024-08-31 NOTE — Patient Instructions (Addendum)
 Medication Instructions:  STOP Ezetimibe  (Zetia )  *If you need a refill on your cardiac medications before your next appointment, please call your pharmacy*  Follow-Up: At [facility], you and your health needs are our priority.  As part of our continuing mission to provide you with exceptional heart care, our providers are all part of one team.  This team includes your primary Cardiologist (physician) and Advanced Practice Providers or APPs (Physician Assistants and Nurse Practitioners) who all work together to provide you with the care you need, when you need it.  Your next appointment:   In January 2026 with Dr. Cherrie  AND In April 2026 with Dr. Raford or Reche GORMAN Finder, NP   We recommend signing up for the patient portal called MyChart.  Sign up information is provided on this After Visit Summary.  MyChart is used to connect with patients for Virtual Visits (Telemedicine).  Patients are able to view lab/test results, encounter notes, upcoming appointments, etc.  Non-urgent messages can be sent to your provider as well.   To learn more about what you can do with MyChart, go to [email or web address].   Other Instructions  We have referred you to Dr. Carlie to discuss Inspire device.  [facility] [place] [phone] [address] [place], [place] [address] [phone]

## 2024-08-31 NOTE — Progress Notes (Unsigned)
 Cardiology Office Note:  .   Date:  08/31/2024  ID:  [name], DOB 1946-03-20, MRN [id number] PCP: Garald Karlynn GAILS, MD  [facility] Providers Cardiologist:  Annabella Scarce, MD Cardiology APP:  Vannie Reche RAMAN, NP    History of Present Illness: .   [name] is a 78 y.o. male with a hx of relapsed large B-cell lymphoma, CAD, recurrent UTI, atrial fibrillation, BPH, diastolic heart failure, hypertension, hyperlipidemia, OSA, hypothyroid, prior tobacco use.   Initially diagnosed atrial fibrillation around 2012 and started on diltiazem  and amiodarone .  Amiodarone  discontinued due to persistent atrial fibrillation.  Underwent LHC at time of diagnosis with reported 90% obstructive of unknown vessel that developed collaterals.  He was started on Ranexa .  Echo 10/2016 EF 60 to 65%, severe bilateral atrial enlargement, IVC dilated.  He had no angina so Ranexa  discontinued.   Diagnosed with diffuse large B-cell lymphoma 05/2018.  Completed 6 cycles of chemotherapy.  Based on echo 06/2018 EF 50 to 55%, normal diastolic function, apical hypokinesis.  Heart rate poorly controlled to metoprolol  added.  Seen in the ED and BNP elevated to Lasix  20 mg daily added with improvement in breathing.  Echo 06/2019 EF 50-55%, RV mildly reduced systolic function.  Lasix  was transitioned to hydrochlorothiazide  due to urinary frequency and poor blood pressure control.  LHC 08/2019 significant two-vessel CAD sequential 60 to 70% mid LAD lesions and CTO of mid RCA with bridging left-to-right collaterals.  Consideration for two-vessel CABG versus medical management.  Deemed not candidate for surgery.  He was started on Imdur .  Due to kidney stones and urination HCTZ reduced and losartan  increased.   03/22/22 chemo resumed due to recurrence of diffuse large b-cell lymphoma.    Admitted 7/13 - 04/27/2022 after presenting with fatigue, lightheadedness found to have severe sepsis treated with IVF and IV abx  due to urinary tract infection E. coli ESBL. Due to orthostatic hypotension and near syncope, diltiazem  dose reduced. Xarelto  and aspirin  held due to thrombocytopenia. Seen 05/04/2022 by his oncology team and referred to ID  for recurrent UTI.  He was set up for IV ABX through home care.   He has been off Xarelto  since 10/18/22 due to hematuria, frequent falls as risks outweighed benefits.   At visit 12/09/23 he was undergoing Epcoritamab  bispecific for refractory DLBCL with [facility] oncology.  Updated echo 05/23/24 reduced LVEF 25-30% (previously 35-40%), moderate RV dysfunction. He was recommended for 3 day ZIO to assess rate control of atrial fibrillation. It is en route back to ZIO.  Seen 06/05/2024.  Given for the reduced LVEF he was recommended for [facility] and referred to heart failure clinic.  Losartan  12.5 mg daily initiated.  Sleep study also recommended. He established with Dr. Cherrie on 7/25 with plan for cardiac MRI.   07/02/2024 LHC with relatively stable two-vessel coronary disease including 70-80% proximal LAD stenosis and CTO of mid RCA.  LAD disease not hemodynamically significant by FFR.  Moderately elevated right heart filling pressures, mild to moderate pulmonary hypertension, mildly reduced CO/CI.  HFrEF felt to be mixed ischemic and nonischemic.  Recommended for aggressive secondary prevention of coronary artery disease and to resume anticoagulation or at least aspirin  81 mg daily when safe from bleeding/hematology standpoint.    Sleep study 07/17/2024 with severe OSA AHI 30.7/h, no central sleep apnea.  Cardiac MRI in 07/23/2024 LVEF 35%, RVEF 40% both with normal size, nonspecific mid wall LGE at basal inferior RV insertion site and subtle nonspecific  mid wall LGE in basal septum indicative of possible pressure/volume overload with normal T1 and T2 parametric's.  Presents today for follow-up independently.  He is tolerating the losartan  12.5 mg daily without lightheadedness, dizziness.   Upcoming visit with Dr. Nieves of urology, will inquire about resuming Aspirin . Stable exertional dyspnea, no LE edema, no chest pain. Reviewed LHC result.***  Seen 10/298/25 by Advanced Heart Failure clinic. Mild dyspnea with stairs, otherwise no dyspnea. Toprol  25mg  WHS initiated. Losartan  12.5mg  daily continued. Spiro, SLGT2i deferred. He was referred ot cardiac rehab. Recommended for follow up echo in 2-3 months.   Admitted 10/29-11/1/25 with RLE weakness. CT head/neck and MRI brain no acute abnormalities. Outpatient neurosurgery evaluation recommended. CIR denied, home with outpatient PT.   ED visit 08/25/24 with mechanical fall int he bathroom.   Presents today for follow up with his husband [name]. He is working with [facility] PT twice a week. He does feel he has made good progress. He is getting PT/OT through [facility]. He is tolerating Losartan  and Metoprolol . ***   ?titrate metoprolol  or losartan   100-110/60s  Weight stabel 250s  ROS: Please see the history of present illness.    All other systems reviewed and are negative.   Studies Reviewed: .         ***    Risk Assessment/Calculations:     CHA2DS2-VASc Score = 5   This indicates a 7.2% annual risk of stroke. The patient's score is based upon: CHF History: 1 HTN History: 1 Diabetes History: 0 Stroke History: 0 Vascular Disease History: 1 Age Score: 2 Gender Score: 0   {This patient has a significant risk of stroke if diagnosed with atrial fibrillation.  Please consider VKA or DOAC agent for anticoagulation if the bleeding risk is acceptable.   You can also use the SmartPhrase .[email or web address] for documentation.   :[id number]}      STOP-Bang Score:  5  { Consider Dx Sleep Disordered Breathing or Sleep Apnea  ICD G47.33          :1}    Physical Exam:   VS:  BP 106/70   Pulse 66   Ht 6' 4 (1.93 m)   Wt 257 lb 8 oz (116.8 kg)   SpO2 96%   BMI 31.34 kg/m    Wt Readings from Last 3 Encounters:  08/31/24 257 lb 8 oz  (116.8 kg)  08/29/24 254 lb (115.2 kg)  08/25/24 253 lb 8.5 oz (115 kg)    GEN: Well nourished, well developed in no acute distress NECK: No JVD; No carotid bruits CARDIAC: IRIR, no murmurs, rubs, gallops RESPIRATORY:  Clear to auscultation without rales, wheezing or rhonchi  ABDOMEN: Soft, non-tender, non-distended EXTREMITIES:  No edema; No deformity. R inner ankle with small abrasion, bandaid placed in clinic.   ASSESSMENT AND PLAN: .    Longstanding persistent atrial fibrillation - Xarelto  has been discontinued 10/2022 due to thrombocytosis, hematuria, fall risk. We discussed risk/benefit and he prefers to remain off OAC at this time and verbalizes understanding of CVA risk.  Asymptomatic regarding atrial fibrillation.Rate controlled with heart rate routinely less than 100 bpm at home per his report.  Await ZIO monitor report, presently en route to ZIO. Continue metoprolol  tartrate 25 mg twice daily.    CHA2DS2-VASc Score = 5 [CHF History: 1, HTN History: 1, Diabetes History: 0, Stroke History: 0, Vascular Disease History: 1, Age Score: 2, Gender Score: 0].  Therefore, the patient's annual risk of stroke is 7.2 %.  Given frequent falls will defer resumption of OAC.    B cell lymphoma - On completed Epcoritamab  01/2024 per oncology at [facility].     Recurrent UTI -  Followed with Dr. Fleeta Rothman of ID. Hematuria has been much improved since discontinuation of Xarelto . On daily Keflex .    Hypothyroidism - Continue to follow with PCP.    CAD / HLD, LDL goal <70 -medically managed moderate to severe CAD.  GDMT metoprolol  tartrate 25mg  BID, atorvastatin  80mg  daily.  Imdur  previously discontinued due to hypotension. Note routed to urology to request to resume Aspirin .  ALT, lipid panel today.    Systolic and diastolic heart failure - Echo 0/7979 LVEF 50-55% ? Echo 08/2022 LVEF 35-40% ? 05/2024 LVEF 25-30% ? 07/2024 cMRI LVEF 35%, RVEF 40%. Stable exertional dyspnea.  Continue GDMT Metoprolol  succinate  25mg  daily, Losartan  12.5mg  daily.   Low sodium diet, fluid restriction <2L, and daily weights encouraged. Educated to contact our office for weight gain of 2 lbs overnight or 5 lbs in one week.   Upcoming follow up with Dr. Bensimhon as scheduled   Hypertension - BP relatively hypotensive ***.    OSA - prior sleep study >10 years ago. Sleep study 07/21/24 severe OSA, awaiting recommendations from sleep team.Previously did not tolearte CPAP. Referred to Dr. Carlie with [facility]. ***          Dispo: with Dr. Cherrie in January 2026 and with Dr. Raford or Reche GORMAN Finder, NP in April 2026  Signed, Reche GORMAN Finder, NP

## 2024-09-03 ENCOUNTER — Encounter (HOSPITAL_BASED_OUTPATIENT_CLINIC_OR_DEPARTMENT_OTHER): Payer: Self-pay

## 2024-09-04 ENCOUNTER — Ambulatory Visit: Admitting: Internal Medicine

## 2024-09-04 ENCOUNTER — Encounter: Payer: Self-pay | Admitting: Internal Medicine

## 2024-09-04 VITALS — BP 122/68 | HR 68 | Ht 76.0 in | Wt 253.0 lb

## 2024-09-04 DIAGNOSIS — F419 Anxiety disorder, unspecified: Secondary | ICD-10-CM

## 2024-09-04 DIAGNOSIS — I4811 Longstanding persistent atrial fibrillation: Secondary | ICD-10-CM | POA: Diagnosis not present

## 2024-09-04 DIAGNOSIS — N1831 Chronic kidney disease, stage 3a: Secondary | ICD-10-CM

## 2024-09-04 DIAGNOSIS — E559 Vitamin D deficiency, unspecified: Secondary | ICD-10-CM | POA: Diagnosis not present

## 2024-09-04 DIAGNOSIS — E291 Testicular hypofunction: Secondary | ICD-10-CM

## 2024-09-04 DIAGNOSIS — R29898 Other symptoms and signs involving the musculoskeletal system: Secondary | ICD-10-CM | POA: Diagnosis not present

## 2024-09-04 DIAGNOSIS — I5022 Chronic systolic (congestive) heart failure: Secondary | ICD-10-CM | POA: Diagnosis not present

## 2024-09-04 NOTE — Assessment & Plan Note (Signed)
 CHF is compensated.  Continue with current meds F/u w/ Dr Raford

## 2024-09-04 NOTE — Assessment & Plan Note (Signed)
 On Vit D

## 2024-09-04 NOTE — Assessment & Plan Note (Signed)
On Testosterone  Potential benefits of a long term testosterone use as well as potential risks (BPH, MI, OSA, cancer)  and complications were explained to the patient and were aknowledged. 

## 2024-09-04 NOTE — Assessment & Plan Note (Signed)
Hydrate well ?Monitor GFR ?

## 2024-09-04 NOTE — Assessment & Plan Note (Signed)
 Radiculopathy related  In home PT - better

## 2024-09-04 NOTE — Progress Notes (Signed)
 Subjective:  Patient ID: [name], male    DOB: Jul 04, 1946  Age: 78 y.o. MRN: [id number]  CC: Hospitalization Follow-up [facility] follow up 08/25/24 for fall)   HPI [name] presents for falls, gait disorder and RLE weakness - better, ?TIA, LBP, CHF, hypogonadism Doing well w/home PT He is here with [name]   Admit date:     08/08/2024  Discharge date: 08/21/24  Discharge Physician: [name]    PCP: Joseph Karlynn GAILS, MD    Recommendations at discharge:    Follow-up PCP as outpatient Outpatient PT arranged Follow-up cardiology as outpatient to discuss anticoagulation Continue aspirin  81 mg p.o. daily   Discharge Diagnoses: Principal Problem:   Right leg weakness Active Problems:   TIA (transient ischemic attack)   Resolved Problems:   TIA (transient ischemic attack)   Hospital Course: 78 y.o. male with medical history significant of history of relapsed large B-cell lymphoma, coronary artery disease, recurrent UTIs, atrial fibrillation (not on blood thinner due to bleeding/hematuria), chronic systolic heart failure.  Patient was in his normal health on 10/29 when he attended a heart failure clinic appointment.  After the doctor appointment he had acute right leg weakness-he describes as unable to stand as it gives out.  No stroke found on MRI.  Patient did have abnormal lumbar spine MRI-seen by [name] (no note yet written but he reports to me that patient needs to be seen by cardiology for risk factor mitigation if surgery is ever needed).  Insurance authorization pending for CIR      Assessment and Plan:   Right LE weakness -Improving, working with PT in the hospital -Patient came to hospital with several falls at home -CT head and neck was negative, MRI brain did not show acute abnormalities -MRI of lumbar spine showed severe chronic compression deformity of L1, multifactorial degenerative changes at L3-4, L4-5 with resultant moderate spinal  stenosis -Neurology was consulted, recommended outpatient EMG for further evaluation of weakness -[name] discussed MRI report with [name], neurosurgeon; recommended outpatient evaluation, no emergent surgery recommended -PT OT eval -Recommended CIR however insurance company has denied CIR - Patient has appealed the decision.   - Decision was upheld.  Will go home with outpatient PT.     Chronic atrial fibrillation Rate controlled - Off AC due to bleeds and sees from chart review appears to be related to hematuria but also patient is a fall risk -Started on aspirin  81 mg p.o. daily -Will discuss with cardiology as outpatient to restart anticoagulation   CKD stage IIIB -baseline around 2 -Stable   Hypothyroidism -on synthroid  -TSH is elevated at 31.7, will need outpatient follow-up - Free T4 is normal -Check TSH as outpatient   BPH -proscar  - Continue Uroxatrol -Discontinue Flomax    HTN -resume home meds   Hx of relapsed large B-cell lymphoma   s/p R-CHOP 2019 - s/p RGDP 2023 - recently treated with Epcoritamab     Recurrent UTI - Takes cephalexin  250 mg p.o. nightly chronically at home for UTI prevention -UA obtained today shows large leukocytes, rare bacteria, 21-50 RBCs per high-power field -Patient has history of recurrent UTI and is chronically on cephalexin  -Continue cephalexin . -He is asymptomatic at this time, will continue to monitor   Chronic systolic CHF -follows with HF clinic- [name] -05/2024 LVEF 25-30%  -GDT as able   OSA -does not tolerate CPAP   Hyperlipidemia - Patient is on every 2 weeks Repatha  shots as outpatient -  Continue low-dose of atorvastatin  40 mg daily   Osteoporosis - Patient is on testosterone  injection every 2 weeks      Outpatient Medications Prior to Visit  Medication Sig Dispense Refill   acetaminophen  (TYLENOL ) 325 MG tablet Take 2 tablets (650 mg total) by mouth every 6 (six) hours as needed for mild pain (or Fever  >/= 101). 20 tablet    acyclovir  (ZOVIRAX ) 400 MG tablet Take 1 tablet (400 mg total) by mouth 2 (two) times daily. 180 tablet 3   albuterol  (VENTOLIN  HFA) 108 (90 Base) MCG/ACT inhaler Inhale 2 puffs into the lungs every 6 (six) hours as needed for wheezing or shortness of breath.     alfuzosin  (UROXATRAL ) 10 MG 24 hr tablet TAKE 1 TABLET AT BEDTIME 90 tablet 3   allopurinol  (ZYLOPRIM ) 100 MG tablet TAKE 1 TABLET EVERY DAY 90 tablet 3   amoxicillin  (AMOXIL ) 500 MG capsule Take 2,000 mg by mouth daily as needed (for dental procedures).     aspirin  81 MG chewable tablet Chew 1 tablet (81 mg total) by mouth daily. 30 tablet 3   atorvastatin  (LIPITOR ) 40 MG tablet Take 1 tablet (40 mg total) by mouth daily. 30 tablet 3   buPROPion  (WELLBUTRIN  XL) 300 MG 24 hr tablet TAKE 1 TABLET EVERY DAY WITH BREAKFAST 90 tablet 3   Calcium  Citrate-Vitamin D  (CALCIUM  CITRATE + D PO) Take 1 tablet by mouth 2 (two) times daily. 1200mg  of Calcium  and 1000 units vitamin D3     cephALEXin  (KEFLEX ) 250 MG capsule Take 250 mg by mouth daily.     Cholecalciferol  (VITAMIN D ) 50 MCG (2000 UT) tablet Take 4,000 Units by mouth in the morning and at bedtime.     clonazePAM  (KLONOPIN ) 1 MG tablet TAKE 1 TABLET THREE TIMES DAILY AS NEEDED FOR ANXIETY 90 tablet 3   CRANBERRY PO Take 1 tablet by mouth in the morning and at bedtime.     denosumab  (PROLIA ) 60 MG/ML SOSY injection Inject 60 mg as directed every 6 (six) months.     DULoxetine  (CYMBALTA ) 60 MG capsule TAKE 1 CAPSULE TWICE DAILY 180 capsule 3   EPINEPHrine  0.3 mg/0.3 mL IJ SOAJ injection Inject 0.3 mg into the muscle once as needed for anaphylaxis (severe allergic reaction).     Evolocumab  (REPATHA  SURECLICK) 140 MG/ML SOAJ Inject 140 mg into the skin every 14 (fourteen) days. 6 mL 3   ferrous sulfate  325 (65 FE) MG tablet Take 325 mg by mouth 2 (two) times daily with a meal.     finasteride  (PROSCAR ) 5 MG tablet Take 5 mg by mouth daily.     levothyroxine  (SYNTHROID )  125 MCG tablet TAKE 1 TABLET EVERY DAY AT 6AM 90 tablet 3   loratadine  (CLARITIN ) 10 MG tablet Take 10 mg by mouth daily.     losartan  (COZAAR ) 25 MG tablet Take 0.5 tablets (12.5 mg total) by mouth every evening. 45 tablet 3   metoprolol  succinate (TOPROL  XL) 25 MG 24 hr tablet Take 1 tablet (25 mg total) by mouth daily. 90 tablet 3   Multiple Vitamin (MULTIVITAMIN WITH MINERALS) TABS tablet Take 1 tablet by mouth every morning. Men's One-A-Day 50+     MYRBETRIQ  25 MG TB24 tablet TAKE 1 TABLET EVERY DAY 90 tablet 3   nitroGLYCERIN  (NITROSTAT ) 0.4 MG SL tablet Place 0.4 mg under the tongue every 5 (five) minutes as needed for chest pain.     omeprazole  (PRILOSEC) 40 MG capsule TAKE 1 CAPSULE EVERY DAY BEFORE  BREAKFAST 90 capsule 3   Potassium Citrate 15 MEQ (1620 MG) TBCR TAKE 1 TABLET IN THE MORNING AND AT BEDTIME 180 tablet 3   Probiotic Product (PROBIOTIC PO) Take 1 capsule by mouth daily with breakfast.     SKYRIZI PEN 150 MG/ML pen Inject 150 mg into the skin every 3 (three) months.     solifenacin (VESICARE) 10 MG tablet Take 10 mg by mouth daily.     testosterone  enanthate (DELATESTRYL ) 200 MG/ML injection Inject 1 mL (200 mg total) into the muscle every 14 (fourteen) days. Every other Friday 5 mL 3   vitamin C  (ASCORBIC ACID ) 500 MG tablet Take 500 mg by mouth daily with lunch.     No facility-administered medications prior to visit.    ROS: Review of Systems  Constitutional:  Positive for fatigue. Negative for appetite change and unexpected weight change.  HENT:  Negative for congestion, nosebleeds, sneezing, sore throat and trouble swallowing.   Eyes:  Negative for itching and visual disturbance.  Respiratory:  Negative for cough.   Cardiovascular:  Negative for chest pain, palpitations and leg swelling.  Gastrointestinal:  Negative for abdominal distention, blood in stool, diarrhea and nausea.  Genitourinary:  Negative for frequency and hematuria.  Musculoskeletal:  Positive for  arthralgias, back pain and gait problem. Negative for joint swelling and neck pain.  Skin:  Negative for rash.  Neurological:  Negative for dizziness, tremors, speech difficulty and weakness.  Psychiatric/Behavioral:  Negative for agitation, dysphoric mood and sleep disturbance. The patient is not nervous/anxious.     Objective:  BP 122/68   Pulse 68   Ht 6' 4 (1.93 m)   Wt 253 lb (114.8 kg)   SpO2 99%   BMI 30.80 kg/m   BP Readings from Last 3 Encounters:  09/04/24 122/68  08/31/24 106/70  08/29/24 120/82    Wt Readings from Last 3 Encounters:  09/04/24 253 lb (114.8 kg)  08/31/24 257 lb 8 oz (116.8 kg)  08/29/24 254 lb (115.2 kg)    Physical Exam Constitutional:      General: He is not in acute distress.    Appearance: He is well-developed. He is obese. He is not ill-appearing.     Comments: NAD  Eyes:     Conjunctiva/sclera: Conjunctivae normal.     Pupils: Pupils are equal, round, and reactive to light.  Neck:     Thyroid : No thyromegaly.     Vascular: No JVD.  Cardiovascular:     Rate and Rhythm: Normal rate and regular rhythm.     Heart sounds: Normal heart sounds. No murmur heard.    No friction rub. No gallop.  Pulmonary:     Effort: Pulmonary effort is normal. No respiratory distress.     Breath sounds: Normal breath sounds. No wheezing or rales.  Chest:     Chest wall: No tenderness.  Abdominal:     General: Bowel sounds are normal. There is no distension.     Palpations: Abdomen is soft. There is no mass.     Tenderness: There is no abdominal tenderness. There is no guarding or rebound.  Musculoskeletal:        General: No tenderness. Normal range of motion.     Cervical back: Normal range of motion.  Lymphadenopathy:     Cervical: No cervical adenopathy.  Skin:    General: Skin is warm and dry.     Findings: No rash.  Neurological:     Mental Status: He is alert  and oriented to person, place, and time.     Cranial Nerves: No cranial nerve  deficit.     Motor: No abnormal muscle tone.     Coordination: Coordination normal.     Gait: Gait normal.     Deep Tendon Reflexes: Reflexes are normal and symmetric.  Psychiatric:        Behavior: Behavior normal.        Thought Content: Thought content normal.        Judgment: Judgment normal.     Lab Results  Component Value Date   WBC 8.3 08/14/2024   HGB 16.7 08/14/2024   HCT 49.0 08/14/2024   PLT 144 (L) 08/14/2024   GLUCOSE 100 (H) 08/14/2024   CHOL 70 08/10/2024   TRIG 42 08/10/2024   HDL 48 08/10/2024   LDLDIRECT 37 01/28/2023   LDLCALC 14 08/10/2024   ALT 24 08/08/2024   AST 32 08/08/2024   NA 135 08/14/2024   K 4.0 08/14/2024   CL 100 08/14/2024   CREATININE 2.08 (H) 08/16/2024   BUN 33 (H) 08/14/2024   CO2 24 08/14/2024   TSH 31.742 (H) 08/11/2024   PSA 1.08 06/26/2020   INR 1.0 08/08/2024   HGBA1C 5.6 08/10/2024    No results found.  Assessment & Plan:   Problem List Items Addressed This Visit     Anxiety disorder   Clonazepam  1 mg tid prn Doing well.  Stable symptoms are under control  Potential benefits of a long term benzodiazepines  use as well as potential risks  and complications were explained to the patient and were aknowledged.       Chronic systolic CHF (congestive heart failure) (HCC)   CHF is compensated.  Continue with current meds F/u w/ Dr Raford       Hypogonadism in male   On Testosterone   Potential benefits of a long term testosterone  use as well as potential risks (BPH, MI, OSA, cancer)  and complications were explained to the patient and were aknowledged.      Longstanding persistent atrial fibrillation (HCC)   Cont w/Xarelto  Rate controlled.  Doing well      Right leg weakness - Primary   Radiculopathy related  In home PT - better      Stage 3a chronic kidney disease (CKD) (HCC) - Baseline Scr 1.5 - 1.6   Hydrate well Monitor GFR      Vitamin D  deficiency   On Vit D         No orders of the defined  types were placed in this encounter.     Follow-up: Return in about 3 months (around 12/05/2024) for a follow-up visit.  Marolyn Noel, MD

## 2024-09-04 NOTE — Assessment & Plan Note (Signed)
 Cont w/Xarelto  Rate controlled.  Doing well

## 2024-09-04 NOTE — Assessment & Plan Note (Signed)
 Clonazepam  1 mg tid prn Doing well.  Stable symptoms are under control  Potential benefits of a long term benzodiazepines  use as well as potential risks  and complications were explained to the patient and were aknowledged.

## 2024-09-05 DIAGNOSIS — T84093A Other mechanical complication of internal left knee prosthesis, initial encounter: Secondary | ICD-10-CM | POA: Diagnosis not present

## 2024-09-05 DIAGNOSIS — Z96652 Presence of left artificial knee joint: Secondary | ICD-10-CM | POA: Diagnosis not present

## 2024-09-05 DIAGNOSIS — Z96651 Presence of right artificial knee joint: Secondary | ICD-10-CM | POA: Diagnosis not present

## 2024-09-11 ENCOUNTER — Other Ambulatory Visit: Payer: Self-pay

## 2024-09-11 ENCOUNTER — Observation Stay (HOSPITAL_COMMUNITY)
Admission: EM | Admit: 2024-09-11 | Discharge: 2024-09-14 | Disposition: A | Discharge status: Home / Self Care | Attending: Internal Medicine | Admitting: Internal Medicine

## 2024-09-11 ENCOUNTER — Encounter (HOSPITAL_COMMUNITY): Payer: Self-pay | Admitting: Emergency Medicine

## 2024-09-11 DIAGNOSIS — R531 Weakness: Secondary | ICD-10-CM | POA: Diagnosis present

## 2024-09-11 DIAGNOSIS — E890 Postprocedural hypothyroidism: Secondary | ICD-10-CM | POA: Diagnosis not present

## 2024-09-11 DIAGNOSIS — F32A Depression, unspecified: Secondary | ICD-10-CM | POA: Diagnosis not present

## 2024-09-11 DIAGNOSIS — F419 Anxiety disorder, unspecified: Secondary | ICD-10-CM | POA: Diagnosis present

## 2024-09-11 DIAGNOSIS — Z9181 History of falling: Secondary | ICD-10-CM | POA: Insufficient documentation

## 2024-09-11 DIAGNOSIS — E785 Hyperlipidemia, unspecified: Secondary | ICD-10-CM | POA: Diagnosis not present

## 2024-09-11 DIAGNOSIS — M4856XA Collapsed vertebra, not elsewhere classified, lumbar region, initial encounter for fracture: Secondary | ICD-10-CM | POA: Insufficient documentation

## 2024-09-11 DIAGNOSIS — I130 Hypertensive heart and chronic kidney disease with heart failure and stage 1 through stage 4 chronic kidney disease, or unspecified chronic kidney disease: Secondary | ICD-10-CM | POA: Diagnosis not present

## 2024-09-11 DIAGNOSIS — R051 Acute cough: Secondary | ICD-10-CM | POA: Diagnosis present

## 2024-09-11 DIAGNOSIS — I4819 Other persistent atrial fibrillation: Principal | ICD-10-CM | POA: Insufficient documentation

## 2024-09-11 DIAGNOSIS — C8330 Diffuse large B-cell lymphoma, unspecified site: Secondary | ICD-10-CM | POA: Insufficient documentation

## 2024-09-11 DIAGNOSIS — E7800 Pure hypercholesterolemia, unspecified: Secondary | ICD-10-CM | POA: Insufficient documentation

## 2024-09-11 DIAGNOSIS — I5032 Chronic diastolic (congestive) heart failure: Secondary | ICD-10-CM | POA: Diagnosis not present

## 2024-09-11 DIAGNOSIS — N390 Urinary tract infection, site not specified: Secondary | ICD-10-CM | POA: Insufficient documentation

## 2024-09-11 DIAGNOSIS — Z87440 Personal history of urinary (tract) infections: Secondary | ICD-10-CM | POA: Insufficient documentation

## 2024-09-11 DIAGNOSIS — M6281 Muscle weakness (generalized): Secondary | ICD-10-CM | POA: Insufficient documentation

## 2024-09-11 DIAGNOSIS — Z8719 Personal history of other diseases of the digestive system: Secondary | ICD-10-CM

## 2024-09-11 DIAGNOSIS — I10 Essential (primary) hypertension: Secondary | ICD-10-CM | POA: Diagnosis present

## 2024-09-11 DIAGNOSIS — Z79890 Hormone replacement therapy (postmenopausal): Secondary | ICD-10-CM | POA: Diagnosis not present

## 2024-09-11 DIAGNOSIS — I4811 Longstanding persistent atrial fibrillation: Secondary | ICD-10-CM | POA: Diagnosis not present

## 2024-09-11 DIAGNOSIS — W010XXA Fall on same level from slipping, tripping and stumbling without subsequent striking against object, initial encounter: Secondary | ICD-10-CM | POA: Insufficient documentation

## 2024-09-11 DIAGNOSIS — Z79899 Other long term (current) drug therapy: Secondary | ICD-10-CM | POA: Diagnosis not present

## 2024-09-11 DIAGNOSIS — Z87891 Personal history of nicotine dependence: Secondary | ICD-10-CM | POA: Diagnosis not present

## 2024-09-11 DIAGNOSIS — E039 Hypothyroidism, unspecified: Secondary | ICD-10-CM | POA: Insufficient documentation

## 2024-09-11 DIAGNOSIS — F411 Generalized anxiety disorder: Secondary | ICD-10-CM | POA: Diagnosis not present

## 2024-09-11 DIAGNOSIS — I129 Hypertensive chronic kidney disease with stage 1 through stage 4 chronic kidney disease, or unspecified chronic kidney disease: Secondary | ICD-10-CM | POA: Insufficient documentation

## 2024-09-11 DIAGNOSIS — N1831 Chronic kidney disease, stage 3a: Secondary | ICD-10-CM | POA: Diagnosis present

## 2024-09-11 DIAGNOSIS — S76112D Strain of left quadriceps muscle, fascia and tendon, subsequent encounter: Secondary | ICD-10-CM | POA: Diagnosis not present

## 2024-09-11 DIAGNOSIS — Z7982 Long term (current) use of aspirin: Secondary | ICD-10-CM | POA: Insufficient documentation

## 2024-09-11 DIAGNOSIS — S2242XA Multiple fractures of ribs, left side, initial encounter for closed fracture: Secondary | ICD-10-CM | POA: Diagnosis not present

## 2024-09-11 DIAGNOSIS — R29898 Other symptoms and signs involving the musculoskeletal system: Secondary | ICD-10-CM | POA: Diagnosis not present

## 2024-09-11 NOTE — ED Triage Notes (Signed)
 Patient was seen last week for leg weakness. Today he experienced that same leg weakness and fell tonight. He did not hit his head, did not lose consciousness and is not blood thinners.

## 2024-09-12 ENCOUNTER — Emergency Department (HOSPITAL_COMMUNITY)

## 2024-09-12 DIAGNOSIS — R531 Weakness: Secondary | ICD-10-CM

## 2024-09-12 DIAGNOSIS — S2242XA Multiple fractures of ribs, left side, initial encounter for closed fracture: Secondary | ICD-10-CM | POA: Diagnosis not present

## 2024-09-12 LAB — COMPREHENSIVE METABOLIC PANEL WITH GFR
ALT: 28 U/L (ref 0–44)
AST: 34 U/L (ref 15–41)
Albumin: 3.5 g/dL (ref 3.5–5.0)
Alkaline Phosphatase: 56 U/L (ref 38–126)
Anion gap: 9 (ref 5–15)
BUN: 33 mg/dL — ABNORMAL HIGH (ref 8–23)
CO2: 25 mmol/L (ref 22–32)
Calcium: 9.4 mg/dL (ref 8.9–10.3)
Chloride: 102 mmol/L (ref 98–111)
Creatinine, Ser: 1.77 mg/dL — ABNORMAL HIGH (ref 0.61–1.24)
GFR, Estimated: 39 mL/min — ABNORMAL LOW (ref >60)
Glucose, Bld: 90 mg/dL (ref 70–99)
Potassium: 4.6 mmol/L (ref 3.5–5.1)
Sodium: 136 mmol/L (ref 135–145)
Total Bilirubin: 0.6 mg/dL (ref 0.0–1.2)
Total Protein: 5.7 g/dL — ABNORMAL LOW (ref 6.5–8.1)

## 2024-09-12 LAB — CBC
HCT: 46.1 % (ref 39.0–52.0)
Hemoglobin: 15.5 g/dL (ref 13.0–17.0)
MCH: 35.1 pg — ABNORMAL HIGH (ref 26.0–34.0)
MCHC: 33.6 g/dL (ref 30.0–36.0)
MCV: 104.5 fL — ABNORMAL HIGH (ref 80.0–100.0)
Platelets: 128 K/uL — ABNORMAL LOW (ref 150–400)
RBC: 4.41 MIL/uL (ref 4.22–5.81)
RDW: 16.5 % — ABNORMAL HIGH (ref 11.5–15.5)
WBC: 5.5 K/uL (ref 4.0–10.5)
nRBC: 0 % (ref 0.0–0.2)

## 2024-09-12 LAB — RESP PANEL BY RT-PCR (RSV, FLU A&B, COVID)  RVPGX2
Influenza A by PCR: NEGATIVE
Influenza B by PCR: NEGATIVE
Resp Syncytial Virus by PCR: NEGATIVE
SARS Coronavirus 2 by RT PCR: NEGATIVE

## 2024-09-12 LAB — URINALYSIS, ROUTINE W REFLEX MICROSCOPIC
Bacteria, UA: NONE SEEN
Bilirubin Urine: NEGATIVE
Glucose, UA: NEGATIVE mg/dL
Ketones, ur: NEGATIVE mg/dL
Nitrite: NEGATIVE
Protein, ur: 30 mg/dL — AB
RBC / HPF: >50 RBC/hpf (ref 0–5)
Specific Gravity, Urine: 1.014 (ref 1.005–1.030)
WBC, UA: >50 WBC/hpf (ref 0–5)
pH: 6 (ref 5.0–8.0)

## 2024-09-12 LAB — CBC WITH DIFFERENTIAL/PLATELET
Abs Immature Granulocytes: 0.03 K/uL (ref 0.00–0.07)
Basophils Absolute: 0 K/uL (ref 0.0–0.1)
Basophils Relative: 0 %
Eosinophils Absolute: 0.2 K/uL (ref 0.0–0.5)
Eosinophils Relative: 2 %
HCT: 46.3 % (ref 39.0–52.0)
Hemoglobin: 15.6 g/dL (ref 13.0–17.0)
Immature Granulocytes: 1 %
Lymphocytes Relative: 20 %
Lymphs Abs: 1.3 K/uL (ref 0.7–4.0)
MCH: 35.1 pg — ABNORMAL HIGH (ref 26.0–34.0)
MCHC: 33.7 g/dL (ref 30.0–36.0)
MCV: 104.3 fL — ABNORMAL HIGH (ref 80.0–100.0)
Monocytes Absolute: 0.5 K/uL (ref 0.1–1.0)
Monocytes Relative: 8 %
Neutro Abs: 4.6 K/uL (ref 1.7–7.7)
Neutrophils Relative %: 69 %
Platelets: 134 K/uL — ABNORMAL LOW (ref 150–400)
RBC: 4.44 MIL/uL (ref 4.22–5.81)
RDW: 16.4 % — ABNORMAL HIGH (ref 11.5–15.5)
WBC: 6.5 K/uL (ref 4.0–10.5)
nRBC: 0 % (ref 0.0–0.2)

## 2024-09-12 LAB — CREATININE, SERUM
Creatinine, Ser: 1.7 mg/dL — ABNORMAL HIGH (ref 0.61–1.24)
GFR, Estimated: 41 mL/min — ABNORMAL LOW (ref >60)

## 2024-09-12 MED ORDER — ALBUTEROL SULFATE (2.5 MG/3ML) 0.083% IN NEBU: 2.5000 mg | INHALATION_SOLUTION | RESPIRATORY_TRACT | Status: DC | PRN

## 2024-09-12 MED ORDER — ONDANSETRON HCL 4 MG PO TABS: 4.0000 mg | ORAL_TABLET | Freq: Four times a day (QID) | ORAL | Status: DC | PRN

## 2024-09-12 MED ORDER — SODIUM CHLORIDE 0.9 % IV SOLN: 2.0000 g | INTRAVENOUS | Status: DC

## 2024-09-12 MED ORDER — ACETAMINOPHEN 650 MG RE SUPP: 650.0000 mg | Freq: Four times a day (QID) | RECTAL | Status: DC | PRN

## 2024-09-12 MED ORDER — CEPHALEXIN 250 MG PO CAPS
250.0000 mg | ORAL_CAPSULE | Freq: Every day | ORAL | Status: DC
  Administered 2024-09-13 – 2024-09-14 (×2): 250 mg via ORAL
  Filled 2024-09-12 (×2): qty 1

## 2024-09-12 MED ORDER — SODIUM CHLORIDE 0.9 % IV SOLN
1.0000 g | Freq: Once | INTRAVENOUS | Status: AC
  Administered 2024-09-12: 1 g via INTRAVENOUS
  Filled 2024-09-12: qty 10

## 2024-09-12 MED ORDER — SODIUM CHLORIDE 0.9 % IV SOLN: INTRAVENOUS | Status: DC

## 2024-09-12 MED ORDER — ONDANSETRON HCL 4 MG/2ML IJ SOLN: 4.0000 mg | Freq: Four times a day (QID) | INTRAMUSCULAR | Status: DC | PRN

## 2024-09-12 MED ORDER — TRAZODONE HCL 50 MG PO TABS
25.0000 mg | ORAL_TABLET | Freq: Every evening | ORAL | Status: DC | PRN
  Administered 2024-09-13: 25 mg via ORAL
  Filled 2024-09-12: qty 1

## 2024-09-12 MED ORDER — ACETAMINOPHEN 325 MG PO TABS: 650.0000 mg | ORAL_TABLET | Freq: Four times a day (QID) | ORAL | Status: DC | PRN

## 2024-09-12 MED ORDER — SODIUM CHLORIDE 0.9 % IV SOLN: 1.0000 g | Freq: Once | INTRAVENOUS | Status: DC

## 2024-09-12 MED ORDER — CLONAZEPAM 1 MG PO TABS
1.0000 mg | ORAL_TABLET | Freq: Once | ORAL | Status: AC
  Administered 2024-09-12: 1 mg via ORAL
  Filled 2024-09-12: qty 1

## 2024-09-12 MED ORDER — HEPARIN SODIUM (PORCINE) 5000 UNIT/ML IJ SOLN
5000.0000 [IU] | Freq: Three times a day (TID) | INTRAMUSCULAR | Status: DC
  Administered 2024-09-12 – 2024-09-14 (×6): 5000 [IU] via SUBCUTANEOUS
  Filled 2024-09-12 (×6): qty 1

## 2024-09-12 NOTE — Evaluation (Signed)
 Physical Therapy Evaluation Patient Details Name: [name] MRN: [id number] DOB: Dec 29, 1945 Today's Date: 09/12/2024  History of Present Illness  [name] is a 78 y.o. male being admitted with recurrent fall.  PMH: atrial fibrillation not on anticoagulation due to multiple falls, heart failure with preserved EF, CKD stage IIIa, relapsed large B-cell lymphoma and chronic right lower extremity edema resulting in multiple falls , CHF, osteoporosis, multiple orthopedic surgeries but not limited to R THA and revisions, bil TKA, R tibia ORIF, bil wrist ORIF, L quad tendon repair  Clinical Impression  Pt admitted with above diagnosis. PTA, pt reports using RW for ambulation in the home and community distances, spouse hovers and assists as needed, pt reports was able to ascend/descend front house steps with RW and handrail, using stair lift to access 2nd level but has 3 steps at the top that pt reports crawl up. On eval, pt with generalized weakness, reports some numbness/tingling in feet and hands, decreased activity tolerance. Pt needing mod A+2 for transfers with RW, generally unsteady, slight bil knee buckling needing assist and cues to maintain knee extension, assist to manage RW and reliant on heavy BUE assist. Reviewed seated exercises and pt able to demonstrate and verbalize understanding; encouraged to perform additional sets/reps as able. Pt reports hopeful to d/c home with spouse support, but if not progressing acutely, may need to consider SNF prior to return home with spouse.Pt currently with functional limitations due to the deficits listed below (see PT Problem List). Pt will benefit from acute skilled PT to increase their independence and safety with mobility to allow discharge.           If plan is discharge home, recommend the following: A lot of help with walking and/or transfers;A lot of help with bathing/dressing/bathroom;Assistance with cooking/housework;Assist for  transportation;Help with stairs or ramp for entrance   Can travel by private vehicle        Equipment Recommendations None recommended by PT  Recommendations for Other Services       Functional Status Assessment Patient has had a recent decline in their functional status and demonstrates the ability to make significant improvements in function in a reasonable and predictable amount of time.     Precautions / Restrictions Precautions Precautions: Fall Recall of Precautions/Restrictions: Intact Precaution/Restrictions Comments: osteoporosis Restrictions Weight Bearing Restrictions Per Provider Order: No      Mobility  Bed Mobility Overal bed mobility: Needs Assistance Bed Mobility: Supine to Sit     Supine to sit: Contact guard, HOB elevated, Used rails     General bed mobility comments: increased time and effort, using bedrail and elevated HOB to mobilize to bedside    Transfers Overall transfer level: Needs assistance Equipment used: Rolling walker (2 wheels) Transfers: Sit to/from Stand, Bed to chair/wheelchair/BSC Sit to Stand: Mod assist, +2 physical assistance, +2 safety/equipment, From elevated surface   Step pivot transfers: Mod assist, +2 physical assistance, +2 safety/equipment       General transfer comment: mod A+2 to power up to standing from elevated bedside and from [facility], slight knee buckling requiring increased assist to stabilize and stand fully upright; mod A+2 to step pivot bed<>BSC and bed to recliner    Ambulation/Gait               General Gait Details: pt able to clear feet minimally from floor to step pivot, heavy reliance on [employer]            Wheelchair  Mobility     Tilt Bed    Modified Rankin (Stroke Patients Only)       Balance Overall balance assessment: Needs assistance, History of Falls Sitting-balance support: Feet supported Sitting balance-Leahy Scale: Fair Sitting balance - Comments: not challenged    Standing balance support: Bilateral upper extremity supported, During functional activity, Reliant on assistive device for balance Standing balance-Leahy Scale: Poor                               Pertinent Vitals/Pain Pain Assessment Pain Assessment: No/denies pain    Home Living Family/patient expects to be discharged to:: Private residence Living Arrangements: Spouse/significant other Available Help at Discharge: Family;Available 24 hours/day Type of Home: House Home Access: Stairs to enter Entrance Stairs-Rails: Doctor, General Practice of Steps: 5 Alternate Level Stairs-Number of Steps: 3 + chair lift Home Layout: Two level Home Equipment: Shower seat;Grab bars - tub/shower;Grab bars - toilet;BSC/3in1;Rolling Walker (2 wheels);Rollator (4 wheels);Cane - single point;Wheelchair - manual Additional Comments: pt reports active with HHPT    Prior Function Prior Level of Function : Independent/Modified Independent;Driving;History of Falls (last six months)             Mobility Comments: pt reports using RW for ambulation in the home and community, using RW and handrail to enter/exit front steps, uses chair lift but has 3 steps after a curve that chair lift doesn't access so pt crawls up them ADLs Comments: pt reports spouse hovers and assits as needed     Extremity/Trunk Assessment   Upper Extremity Assessment Upper Extremity Assessment: Defer to OT evaluation    Lower Extremity Assessment Lower Extremity Assessment: Generalized weakness RLE Deficits / Details: ankle AROM WFL, hip flexion 3/5, knee extension 3+/5, hip abduction 3+/5, hip adduction 3+/5 RLE Sensation:  (pt reports numbness/tingling in feet) LLE Deficits / Details: ankle AROM WFL, hip flexion 3/5, knee extension 3+/5, hip abduction 3+/5, hip adduction 3+/5 LLE Sensation:  (pt reports numbness/tingling in feet)    Cervical / Trunk Assessment Cervical / Trunk Assessment: Normal   Communication   Communication Communication: No apparent difficulties Factors Affecting Communication: Hearing impaired (hearing aides)    Cognition Arousal: Alert Behavior During Therapy: WFL for tasks assessed/performed   PT - Cognitive impairments: No apparent impairments                       PT - Cognition Comments: verbose, needing redirection to task Following commands: Intact       Cueing       General Comments      Exercises     Assessment/Plan    PT Assessment Patient needs continued PT services  PT Problem List Decreased strength;Decreased activity tolerance;Decreased balance;Decreased mobility;Decreased knowledge of use of DME;Decreased safety awareness;Decreased knowledge of precautions;Impaired sensation       PT Treatment Interventions DME instruction;Gait training;Stair training;Functional mobility training;Therapeutic activities;Therapeutic exercise;Balance training;Neuromuscular re-education;Patient/family education    PT Goals (Current goals can be found in the Care Plan section)  Acute Rehab PT Goals Patient Stated Goal: return home, resume HHPT PT Goal Formulation: With patient Time For Goal Achievement: 09/26/24 Potential to Achieve Goals: Good    Frequency Min 3X/week     Co-evaluation               AM-PAC PT 6 Clicks Mobility  Outcome Measure Help needed turning from your back to your side while in a flat bed  without using bedrails?: A Little Help needed moving from lying on your back to sitting on the side of a flat bed without using bedrails?: A Little Help needed moving to and from a bed to a chair (including a wheelchair)?: Total Help needed standing up from a chair using your arms (e.g., wheelchair or bedside chair)?: Total Help needed to walk in hospital room?: Total Help needed climbing 3-5 steps with a railing? : Total 6 Click Score: 10    End of Session Equipment Utilized During Treatment: Gait  belt Activity Tolerance: Patient tolerated treatment well Patient left: in chair;with chair alarm set;with call bell/phone within reach Nurse Communication: Mobility status PT Visit Diagnosis: Unsteadiness on feet (R26.81);Muscle weakness (generalized) (M62.81);History of falling (Z91.81)    Time: 8561-8465 PT Time Calculation (min) (ACUTE ONLY): 56 min   Charges:   PT Evaluation $PT Eval Moderate Complexity: 1 Mod PT Treatments $Therapeutic Activity: 23-37 mins PT General Charges $$ ACUTE PT VISIT: 1 Visit         Tori Jacqulynn Shappell PT, DPT 09/12/24, 3:48 PM

## 2024-09-12 NOTE — ED Provider Notes (Signed)
 [place] EMERGENCY DEPARTMENT AT [facility] Provider Note   CSN: [id number] Arrival date & time: 09/11/24  2213     Patient presents with: Weakness   [name] is a 78 y.o. male.   The history is provided by the patient.  Weakness Severity:  Severe Onset quality:  Gradual Timing:  Constant Progression:  Worsening Chronicity:  Recurrent Context: urinary tract infection   Context comment:  H/o of UTI Relieved by:  Nothing Worsened by:  Nothing Ineffective treatments:  None tried Associated symptoms: falls and fever   Associated symptoms: no chest pain, no cough, no diarrhea, no drooling, no dysphagia, no melena, no seizures, no shortness of breath, no stroke symptoms and no vomiting   Risk factors: congestive heart failure and heart disease       Past Medical History:  Diagnosis Date   Allergy 11/14/1975   Benign localized prostatic hyperplasia with lower urinary tract symptoms (LUTS)    Blood transfusion without reported diagnosis    CAD (coronary artery disease) cardiologist--  dr t. raford   hx of known CAD obstruction w/ collaterals (cath done @ [facility] 12-31-2011) ;   last cardiac cath 08-13-2019  showed sig. 2V CAD involving proxLAD and CTO of the RCA/  chronic total occlusion midRCA w/ bridging and L>R collaterals   Cancer (HCC) 06/17/2018   Chronic kidney disease    Diastolic CHF (HCC)    dx 06-15-2019 hospital admission (followed by pcp)   Diffuse large B cell lymphoma [facility]) oncologist-- dr onesimo--- in remission   dx 08/ 2019 -- bx left tonsill mass-- involving lymph nodes--- completed chemotherapy 10-12-2018   DJD (degenerative joint disease)    Dysthymic disorder    Environmental allergies    GAD (generalized anxiety disorder)    with dysthymia   GERD (gastroesophageal reflux disease)    History of falling    multiple times   History of kidney stones    HLD (hyperlipidemia)    Hyperlipidemia    Hypertension    Hypogonadism in male     OA (osteoarthritis)    OSA on CPAP    cpap machine-settings 17   Osteoporosis with fracture 04/24/2013   Persistent atrial fibrillation (HCC)    On Xarelto , cardiologist--- dr jonelle. [place]   Plaque psoriasis    dermatologist--- dr ryan gammon--- currently taking otezla    Post-surgical hypothyroidism    followed by pcp---  s/p total thyroidectomy for graves disease in 1987   Sleep apnea    Wears hearing aid in both ears      Prior to Admission medications   Medication Sig Start Date End Date Taking? Authorizing Provider  metoprolol  tartrate (LOPRESSOR ) 25 MG tablet Take 25 mg by mouth 2 (two) times daily. 08/26/24  Yes [provider]  acetaminophen  (TYLENOL ) 325 MG tablet Take 2 tablets (650 mg total) by mouth every 6 (six) hours as needed for mild pain (or Fever >/= 101). 03/25/22   Sherrill Cable Latif, DO  acyclovir  (ZOVIRAX ) 400 MG tablet Take 1 tablet (400 mg total) by mouth 2 (two) times daily. 05/28/24   Plotnikov, Karlynn GAILS, MD  albuterol  (VENTOLIN  HFA) 108 (90 Base) MCG/ACT inhaler Inhale 2 puffs into the lungs every 6 (six) hours as needed for wheezing or shortness of breath.    [provider]  alfuzosin  (UROXATRAL ) 10 MG 24 hr tablet TAKE 1 TABLET AT BEDTIME 04/03/24   McKenzie, Belvie CROME, MD  allopurinol  (ZYLOPRIM ) 100 MG tablet TAKE 1 TABLET EVERY  DAY 01/31/24   Plotnikov, Karlynn GAILS, MD  amoxicillin  (AMOXIL ) 500 MG capsule Take 2,000 mg by mouth daily as needed (for dental procedures).    [provider]  aspirin  81 MG chewable tablet Chew 1 tablet (81 mg total) by mouth daily. 08/22/24   Drusilla Sabas RAMAN, MD  atorvastatin  (LIPITOR ) 40 MG tablet Take 1 tablet (40 mg total) by mouth daily. 08/22/24   Drusilla Sabas RAMAN, MD  buPROPion  (WELLBUTRIN  XL) 300 MG 24 hr tablet TAKE 1 TABLET EVERY DAY WITH BREAKFAST 12/19/23   Plotnikov, Aleksei V, MD  Calcium  Citrate-Vitamin D  (CALCIUM  CITRATE + D PO) Take 1 tablet by mouth 2 (two) times daily. 1200mg  of Calcium   and 1000 units vitamin D3    [provider]  cephALEXin  (KEFLEX ) 250 MG capsule Take 250 mg by mouth daily.    [provider]  Cholecalciferol  (VITAMIN D ) 50 MCG (2000 UT) tablet Take 4,000 Units by mouth in the morning and at bedtime.    [provider]  clonazePAM  (KLONOPIN ) 1 MG tablet TAKE 1 TABLET THREE TIMES DAILY AS NEEDED FOR ANXIETY 08/26/24   Plotnikov, Aleksei V, MD  CRANBERRY PO Take 1 tablet by mouth in the morning and at bedtime.    [provider]  denosumab  (PROLIA ) 60 MG/ML SOSY injection Inject 60 mg as directed every 6 (six) months. 12/10/15   [provider]  DULoxetine  (CYMBALTA ) 60 MG capsule TAKE 1 CAPSULE TWICE DAILY 01/16/24   Plotnikov, Aleksei V, MD  EPINEPHrine  0.3 mg/0.3 mL IJ SOAJ injection Inject 0.3 mg into the muscle once as needed for anaphylaxis (severe allergic reaction). 07/31/21   [provider]  Evolocumab  (REPATHA  SURECLICK) 140 MG/ML SOAJ Inject 140 mg into the skin every 14 (fourteen) days. 07/26/24   Vannie Reche RAMAN, NP  ferrous sulfate  325 (65 FE) MG tablet Take 325 mg by mouth 2 (two) times daily with a meal.    [provider]  finasteride  (PROSCAR ) 5 MG tablet Take 5 mg by mouth daily. 09/25/23   [provider]  levothyroxine  (SYNTHROID ) 125 MCG tablet TAKE 1 TABLET EVERY DAY AT 6AM 08/08/24   Plotnikov, Aleksei V, MD  loratadine  (CLARITIN ) 10 MG tablet Take 10 mg by mouth daily.    [provider]  losartan  (COZAAR ) 25 MG tablet Take 0.5 tablets (12.5 mg total) by mouth every evening. 07/16/24 10/14/24  Vannie Reche RAMAN, NP  metoprolol  succinate (TOPROL  XL) 25 MG 24 hr tablet Take 1 tablet (25 mg total) by mouth daily. 08/08/24   Glena Harlene HERO, FNP  Multiple Vitamin (MULTIVITAMIN WITH MINERALS) TABS tablet Take 1 tablet by mouth every morning. Men's One-A-Day 50+    [provider]  MYRBETRIQ  25 MG TB24 tablet TAKE 1 TABLET EVERY DAY 10/06/21   McKenzie,  Belvie CROME, MD  nitroGLYCERIN  (NITROSTAT ) 0.4 MG SL tablet Place 0.4 mg under the tongue every 5 (five) minutes as needed for chest pain.    [provider]  omeprazole  (PRILOSEC) 40 MG capsule TAKE 1 CAPSULE EVERY DAY BEFORE BREAKFAST 01/16/24   Plotnikov, Aleksei V, MD  Potassium Citrate 15 MEQ (1620 MG) TBCR TAKE 1 TABLET IN THE MORNING AND AT BEDTIME 02/21/24   McKenzie, Belvie CROME, MD  Probiotic Product (PROBIOTIC PO) Take 1 capsule by mouth daily with breakfast.    [provider]  SKYRIZI PEN 150 MG/ML pen Inject 150 mg into the skin every 3 (three) months. 09/10/23   [provider]  solifenacin (VESICARE)  10 MG tablet Take 10 mg by mouth daily.    [provider]  testosterone  enanthate (DELATESTRYL ) 200 MG/ML injection Inject 1 mL (200 mg total) into the muscle every 14 (fourteen) days. Every other Friday 06/13/24   Plotnikov, Aleksei V, MD  vitamin C  (ASCORBIC ACID ) 500 MG tablet Take 500 mg by mouth daily with lunch.    [provider]    Allergies: Short ragweed pollen ext    Review of Systems  Constitutional:  Positive for fever.  HENT:  Negative for drooling.   Respiratory:  Negative for cough and shortness of breath.   Cardiovascular:  Negative for chest pain.  Gastrointestinal:  Negative for diarrhea, dysphagia, melena and vomiting.  Musculoskeletal:  Positive for falls.  Neurological:  Positive for weakness. Negative for seizures.  All other systems reviewed and are negative.   Updated Vital Signs BP 126/88 (BP Location: Right Arm)   Pulse 87   Temp 97.7 F (36.5 C)   Resp 17   SpO2 92%   Physical Exam Vitals and nursing note reviewed.  Constitutional:      General: He is not in acute distress.    Appearance: Normal appearance. He is well-developed. He is not diaphoretic.  HENT:     Head: Normocephalic and atraumatic.     Nose: Nose normal.  Eyes:     Conjunctiva/sclera: Conjunctivae normal.     Pupils: Pupils are  equal, round, and reactive to light.  Cardiovascular:     Rate and Rhythm: Normal rate and regular rhythm.     Pulses: Normal pulses.     Heart sounds: Normal heart sounds.  Pulmonary:     Effort: Pulmonary effort is normal.     Breath sounds: Normal breath sounds. No wheezing or rales.  Abdominal:     General: Bowel sounds are normal.     Palpations: Abdomen is soft.     Tenderness: There is no abdominal tenderness. There is no guarding or rebound.  Musculoskeletal:        General: Normal range of motion.     Cervical back: Normal range of motion and neck supple.  Skin:    General: Skin is warm and dry.     Capillary Refill: Capillary refill takes less than 2 seconds.  Neurological:     General: No focal deficit present.     Mental Status: He is alert and oriented to person, place, and time.     Deep Tendon Reflexes: Reflexes normal.     (all labs ordered are listed, but only abnormal results are displayed) Results for orders placed or performed during the hospital encounter of 09/11/24  CBC with Differential   Collection Time: 09/12/24  1:17 AM  Result Value Ref Range   WBC 6.5 4.0 - 10.5 K/uL   RBC 4.44 4.22 - 5.81 MIL/uL   Hemoglobin 15.6 13.0 - 17.0 g/dL   HCT 53.6 60.9 - 47.9 %   MCV 104.3 (H) 80.0 - 100.0 fL   MCH 35.1 (H) 26.0 - 34.0 pg   MCHC 33.7 30.0 - 36.0 g/dL   RDW 83.5 (H) 88.4 - 84.4 %   Platelets 134 (L) 150 - 400 K/uL   nRBC 0.0 0.0 - 0.2 %   Neutrophils Relative % 69 %   Neutro Abs 4.6 1.7 - 7.7 K/uL   Lymphocytes Relative 20 %   Lymphs Abs 1.3 0.7 - 4.0 K/uL   Monocytes Relative 8 %   Monocytes Absolute 0.5 0.1 - 1.0  K/uL   Eosinophils Relative 2 %   Eosinophils Absolute 0.2 0.0 - 0.5 K/uL   Basophils Relative 0 %   Basophils Absolute 0.0 0.0 - 0.1 K/uL   Immature Granulocytes 1 %   Abs Immature Granulocytes 0.03 0.00 - 0.07 K/uL  Comprehensive metabolic panel   Collection Time: 09/12/24  1:17 AM  Result Value Ref Range   Sodium 136 135 - 145  mmol/L   Potassium 4.6 3.5 - 5.1 mmol/L   Chloride 102 98 - 111 mmol/L   CO2 25 22 - 32 mmol/L   Glucose, Bld 90 70 - 99 mg/dL   BUN 33 (H) 8 - 23 mg/dL   Creatinine, Ser 8.22 (H) 0.61 - 1.24 mg/dL   Calcium  9.4 8.9 - 10.3 mg/dL   Total Protein 5.7 (L) 6.5 - 8.1 g/dL   Albumin  3.5 3.5 - 5.0 g/dL   AST 34 15 - 41 U/L   ALT 28 0 - 44 U/L   Alkaline Phosphatase 56 38 - 126 U/L   Total Bilirubin 0.6 0.0 - 1.2 mg/dL   GFR, Estimated 39 (L) >60 mL/min   Anion gap 9 5 - 15  Urinalysis, Routine w reflex microscopic -Urine, Clean Catch   Collection Time: 09/12/24  2:23 AM  Result Value Ref Range   Color, Urine YELLOW YELLOW   APPearance CLOUDY (A) CLEAR   Specific Gravity, Urine 1.014 1.005 - 1.030   pH 6.0 5.0 - 8.0   Glucose, UA NEGATIVE NEGATIVE mg/dL   Hgb urine dipstick LARGE (A) NEGATIVE   Bilirubin Urine NEGATIVE NEGATIVE   Ketones, ur NEGATIVE NEGATIVE mg/dL   Protein, ur 30 (A) NEGATIVE mg/dL   Nitrite NEGATIVE NEGATIVE   Leukocytes,Ua LARGE (A) NEGATIVE   RBC / HPF >50 0 - 5 RBC/hpf   WBC, UA >50 0 - 5 WBC/hpf   Bacteria, UA NONE SEEN NONE SEEN   Squamous Epithelial / HPF 0-5 0 - 5 /HPF   Mucus PRESENT   Resp panel by RT-PCR (RSV, Flu A&B, Covid) Anterior Nasal Swab   Collection Time: 09/12/24  3:48 AM   Specimen: Anterior Nasal Swab  Result Value Ref Range   SARS Coronavirus 2 by RT PCR NEGATIVE NEGATIVE   Influenza A by PCR NEGATIVE NEGATIVE   Influenza B by PCR NEGATIVE NEGATIVE   Resp Syncytial Virus by PCR NEGATIVE NEGATIVE   *Note: Due to a large number of results and/or encounters for the requested time period, some results have not been displayed. A complete set of results can be found in Results Review.   DG Chest Portable 1 View Result Date: 09/12/2024 CLINICAL DATA:  Leg weakness. EXAM: PORTABLE CHEST 1 VIEW COMPARISON:  August 13, 2023 FINDINGS: There is stable right-sided venous Port-A-Cath positioning. The cardiac silhouette is mildly enlarged and  unchanged in size. There is marked severity calcification of the aortic arch. Both lungs are clear. Chronic left-sided rib fractures are noted. The visualized skeletal structures are otherwise unremarkable. IMPRESSION: Stable exam without active cardiopulmonary disease. Electronically Signed   By: Suzen Dials M.D.   On: 09/12/2024 00:30    None  Radiology: DG Chest Portable 1 View Result Date: 09/12/2024 CLINICAL DATA:  Leg weakness. EXAM: PORTABLE CHEST 1 VIEW COMPARISON:  August 13, 2023 FINDINGS: There is stable right-sided venous Port-A-Cath positioning. The cardiac silhouette is mildly enlarged and unchanged in size. There is marked severity calcification of the aortic arch. Both lungs are clear. Chronic left-sided rib fractures are noted. The visualized  skeletal structures are otherwise unremarkable. IMPRESSION: Stable exam without active cardiopulmonary disease. Electronically Signed   By: Suzen Dials M.D.   On: 09/12/2024 00:30     Procedures   Medications Ordered in the ED  0.9 %  sodium chloride  infusion ( Intravenous New Bag/Given 09/12/24 0326)  cefTRIAXone  (ROCEPHIN ) 2 g in sodium chloride  0.9 % 100 mL IVPB (has no administration in time range)  cefTRIAXone  (ROCEPHIN ) 1 g in sodium chloride  0.9 % 100 mL IVPB (0 g Intravenous Stopped 09/12/24 0357)                                    Medical Decision Making Patient with global weakness and frequent fall   Amount and/or Complexity of Data Reviewed External Data Reviewed: notes.    Details: Previous notes reviewed  Labs: ordered.    Details: Normal white count 6.5, normal hemoglobin 15.6, normal platelets.  Normal sodium 136, normal potassium 4.6, elevated creatinine 1.77 (at baseline) negative covid and flu urine is consistent with uti  Radiology: ordered and independent interpretation performed.    Details: Negative CXR  Risk Prescription drug management. Decision regarding hospitalization.     Final  diagnoses:  Generalized weakness  Urinary tract infection without hematuria, site unspecified   The patient appears reasonably stabilized for admission considering the current resources, flow, and capabilities available in the ED at this time, and I doubt any other [facility] requiring further screening and/or treatment in the ED prior to admission.  ED Discharge Orders     None          Carlina Derks, MD 09/12/24 [phone]

## 2024-09-12 NOTE — H&P (Signed)
 History and Physical  [name] FMW:[id number] DOB: 1945-10-20 DOA: 09/11/2024  PCP: Garald Karlynn GAILS, MD   Chief Complaint: mechanical fall   HPI: [name] is a 78 y.o. male with medical history significant for atrial fibrillation not on anticoagulation due to multiple falls, heart failure with preserved EF, CKD stage IIIa, relapsed large B-cell lymphoma and chronic right lower extremity edema resulting in multiple falls being admitted to the hospital with recurrent fall.  History is provided by the patient, who is a knowledgeable and detailed historian.  He was actually hospitalized at the end of October and discharged home on 08/21/2024 after he was diagnosed with TIA and was denied inpatient rehab stay.  Since that time, he says he has overall been doing well, he ambulates at home with a walker and has been working with home health physical therapy and making good progress.  He denies any fevers, chills, nausea, vomiting, dysuria or other evidence of urinary tract infection, which is something that he has had chronically and is on suppressive Keflex .  The night prior to hospital admission, he had gone out to dinner with his husband, when they came back home he stepped up on the curb getting out of the car, using his walker as he typically does.  He went to turn around, tripped over his leg and fell to the ground.  Did not hit his head and did not pass out.  Evaluation in the emergency department is quite benign, however he was diagnosed with a UTI, and hospitalist admission was requested and he was started on empiric IV Rocephin .  Review of Systems: Please see HPI for pertinent positives and negatives. A complete 10 system review of systems are otherwise negative.  Past Medical History:  Diagnosis Date   Allergy 11/14/1975   Benign localized prostatic hyperplasia with lower urinary tract symptoms (LUTS)    Blood transfusion without reported diagnosis    CAD (coronary artery  disease) cardiologist--  dr t. raford   hx of known CAD obstruction w/ collaterals (cath done @ [facility] 12-31-2011) ;   last cardiac cath 08-13-2019  showed sig. 2V CAD involving proxLAD and CTO of the RCA/  chronic total occlusion midRCA w/ bridging and L>R collaterals   Cancer (HCC) 06/17/2018   Chronic kidney disease    Diastolic CHF (HCC)    dx 06-15-2019 hospital admission (followed by pcp)   Diffuse large B cell lymphoma [facility]) oncologist-- dr onesimo--- in remission   dx 08/ 2019 -- bx left tonsill mass-- involving lymph nodes--- completed chemotherapy 10-12-2018   DJD (degenerative joint disease)    Dysthymic disorder    Environmental allergies    GAD (generalized anxiety disorder)    with dysthymia   GERD (gastroesophageal reflux disease)    History of falling    multiple times   History of kidney stones    HLD (hyperlipidemia)    Hyperlipidemia    Hypertension    Hypogonadism in male    OA (osteoarthritis)    OSA on CPAP    cpap machine-settings 17   Osteoporosis with fracture 04/24/2013   Persistent atrial fibrillation (HCC)    On Xarelto , cardiologist--- dr jonelle. [place]   Plaque psoriasis    dermatologist--- dr ryan gammon--- currently taking otezla    Post-surgical hypothyroidism    followed by pcp---  s/p total thyroidectomy for graves disease in 1987   Sleep apnea    Wears hearing aid in both ears    Past Surgical History:  Procedure Laterality Date   APPENDECTOMY     CARDIAC CATHETERIZATION  12-31-2011   dr jinny. nolene @[facility]    normal LVF w/ multivessel CAD,  occluded RCA w/ colleterals   CATARACT EXTRACTION W/ INTRAOCULAR LENS  IMPLANT, BILATERAL  2016 approx.   COLONOSCOPY     CORONARY PRESSURE/FFR STUDY N/A 08/13/2019   Procedure: INTRAVASCULAR PRESSURE WIRE/FFR STUDY;  Surgeon: Mady Bruckner, MD;  [facility];  Service: Cardiovascular;  Laterality: N/A;   CORONARY PRESSURE/FFR WITH 3D MAPPING N/A 07/02/2024   Procedure: Coronary  Pressure/FFR w/3D Mapping;  Surgeon: Mady Bruckner, MD;  Location: [facility];  Service: Cardiovascular;  Laterality: N/A;   CYSTOSCOPY WITH RETROGRADE PYELOGRAM, URETEROSCOPY AND STENT PLACEMENT Left 11/07/2017   Procedure: CYSTOSCOPY WITH RETROGRADE PYELOGRAM, URETEROSCOPY AND STENT PLACEMENT;  Surgeon: Sherrilee Belvie CROME, MD;  Location: [facility];  Service: Urology;  Laterality: Left;   CYSTOSCOPY WITH RETROGRADE PYELOGRAM, URETEROSCOPY AND STENT PLACEMENT Right 10/22/2019   Procedure: CYSTOSCOPY WITH RETROGRADE PYELOGRAM, URETEROSCOPY AND STENT PLACEMENT;  Surgeon: Sherrilee Belvie CROME, MD;  Location: [facility];  Service: Urology;  Laterality: Right;   CYSTOSCOPY/URETEROSCOPY/HOLMIUM LASER/STENT PLACEMENT Right 03/11/2017   Procedure: CYSTOSCOPY/URETEROSCOPYSTENT PLACEMENT right ureter retrograde pylegram;  Surgeon: Sherrilee Belvie CROME, MD;  Location: [facility];  Service: Urology;  Laterality: Right;   EYE SURGERY  08/11/2017   Cataract   FRACTURE SURGERY     HARDWARE REMOVAL  10/05/2012   Procedure: HARDWARE REMOVAL;  Surgeon: Donnice JONETTA Car, MD;  Location: [facility];  Service: Orthopedics;  Laterality: Right;  REMOVING  STRYKER  GAMMA NAIL   HARDWARE REMOVAL Right 07/03/2013   Procedure: HARDWARE REMOVAL RIGHT TIBIA ;  Surgeon: [name] VEAR Bruch, MD;  Location: [facility];  Service: Orthopedics;  Laterality: Right;   HIP CLOSED REDUCTION Right 10/15/2013   Procedure: CLOSED MANIPULATION HIP;  Surgeon: Donnice JONETTA Car, MD;  Location: [facility];  Service: Orthopedics;  Laterality: Right;   HOLMIUM LASER APPLICATION Right 02/08/2017   Procedure: HOLMIUM LASER APPLICATION;  Surgeon: Belvie CROME Sherrilee, MD;  Location: [facility];  Service: Urology;  Laterality: Right;   HOLMIUM LASER APPLICATION Left 03/26/2019   Procedure: HOLMIUM LASER APPLICATION;  Surgeon: Sherrilee Belvie CROME, MD;  [facility]: [facility];  Service: Urology;  Laterality: Left;   HOLMIUM LASER APPLICATION Left 04/17/2019    Procedure: HOLMIUM LASER APPLICATION;  Surgeon: Sherrilee Belvie CROME, MD;  [facility]: [facility];  Service: Urology;  Laterality: Left;   HOLMIUM LASER APPLICATION Right 10/22/2019   Procedure: HOLMIUM LASER APPLICATION;  Surgeon: Sherrilee Belvie CROME, MD;  Location: [facility];  Service: Urology;  Laterality: Right;   INCISION AND DRAINAGE HIP Right 11/16/2013   Procedure: IRRIGATION AND DEBRIDEMENT RIGHT HIP;  Surgeon: Donnice JONETTA Car, MD;  Location: [facility];  Service: Orthopedics;  Laterality: Right;   IR IMAGING GUIDED PORT INSERTION  06/22/2018   IR NEPHROSTOMY PLACEMENT LEFT  03/28/2019   IR URETERAL STENT LEFT NEW ACCESS W/O SEP NEPHROSTOMY CATH  10/24/2017   IR URETERAL STENT LEFT NEW ACCESS W/O SEP NEPHROSTOMY CATH  03/26/2019   JOINT REPLACEMENT     NEPHROLITHOTOMY Right 02/08/2017   Procedure: NEPHROLITHOTOMY PERCUTANEOUS WITH SURGEON ACCESS;  Surgeon: Belvie CROME Sherrilee, MD;  Location: [facility];  Service: Urology;  Laterality: Right;   NEPHROLITHOTOMY Left 10/24/2017   Procedure: NEPHROLITHOTOMY PERCUTANEOUS;  Surgeon: Sherrilee Belvie CROME, MD;  Location: [facility];  Service: Urology;  Laterality: Left;   NEPHROLITHOTOMY Left 03/26/2019   Procedure:  NEPHROLITHOTOMY PERCUTANEOUS;  Surgeon: Sherrilee Belvie CROME, MD;  Location: [facility];  Service: Urology;  Laterality: Left;  2 HRS   NEPHROLITHOTOMY Left 04/17/2019   Procedure: NEPHROLITHOTOMY PERCUTANEOUS;  Surgeon: Sherrilee Belvie CROME, MD;  Location: [facility];  Service: Urology;  Laterality: Left;  2 HRS   ORIF TIBIA FRACTURE Right 02/06/2013   Procedure: OPEN REDUCTION INTERNAL FIXATION (ORIF) TIBIA FRACTURE WITH IM ROD FIBULA;  Surgeon: [name] VEAR Bruch, MD;  [facility]: [facility];  Service: Orthopedics;  Laterality: Right;   ORIF TIBIA FRACTURE Right 07/03/2013   Procedure: RIGHT TIBIA NON UNION REPAIR ;  Surgeon: [name] VEAR Bruch, MD;  Location: [facility] OR;  Service: Orthopedics;  Laterality: Right;   ORIF WRIST FRACTURE  10/02/2012   Procedure:  OPEN REDUCTION INTERNAL FIXATION (ORIF) WRIST FRACTURE;  Surgeon: Elsie Mussel, MD;  Location: [facility];  Service: Orthopedics;  Laterality: Right;  WITH   ANTIBIOTIC  CEMENT   ORIF WRIST FRACTURE Left 10/28/2013   Procedure: OPEN REDUCTION INTERNAL FIXATION (ORIF) WRIST FRACTURE with allograft;  Surgeon: Elsie Mussel, MD;  Location: [facility];  Service: Orthopedics;  Laterality: Left;  DVR Plate   QUADRICEPS TENDON REPAIR Left 07/15/2017   Procedure: REPAIR QUADRICEP TENDON;  Surgeon: Liam Lerner, MD;  Location: [facility] OR;  Service: Orthopedics;  Laterality: Left;   RIGHT/LEFT HEART CATH AND CORONARY ANGIOGRAPHY N/A 08/13/2019   Procedure: RIGHT/LEFT HEART CATH AND CORONARY ANGIOGRAPHY;  Surgeon: Mady Bruckner, MD;  [facility];  Service: Cardiovascular;  Laterality: N/A;   RIGHT/LEFT HEART CATH AND CORONARY ANGIOGRAPHY N/A 07/02/2024   Procedure: RIGHT/LEFT HEART CATH AND CORONARY ANGIOGRAPHY;  Surgeon: Mady Bruckner, MD;  [facility];  Service: Cardiovascular;  Laterality: N/A;   THYROIDECTOMY  02/1986   TOTAL HIP ARTHROPLASTY Right 03-16-2016   @[facility]    TOTAL HIP REVISION  10/05/2012   Procedure: TOTAL HIP REVISION;  Surgeon: Donnice JONETTA Car, MD;  Location: [facility];  Service: Orthopedics;  Laterality: Right;  RIGHT TOTAL HIP REVISION   TOTAL HIP REVISION Right 09/17/2013   Procedure: REVISION RIGHT TOTAL HIP ARTHROPLASTY ;  Surgeon: Donnice JONETTA Car, MD;  Location: [facility];  Service: Orthopedics;  Laterality: Right;   TOTAL HIP REVISION Right 10/26/2013   Procedure: REVISION RIGHT TOTAL HIP ARTHROPLASTY;  Surgeon: Donnice JONETTA Car, MD;  Location: [facility];  Service: Orthopedics;  Laterality: Right;   TOTAL KNEE ARTHROPLASTY Bilateral right 03-15-2011;  left 06-30-2011   TOTAL KNEE REVISION Left 04/11/2017   Procedure: TOTAL KNEE REVISION PATELLA and TIBIA;  Surgeon: Liam Lerner, MD;  Location: [facility];  Service: Orthopedics;  Laterality: Left;   Social History:   reports that he quit smoking about 12 years ago. His smoking use included cigarettes. He started smoking about 62 years ago. He has a 50 pack-year smoking history. He has been exposed to tobacco smoke. He has never used smokeless tobacco. He reports that he does not currently use alcohol . He reports that he does not use drugs.  Allergies  Allergen Reactions   Short Ragweed Pollen Ext Cough    Family History  Problem Relation Age of Onset   CAD Father 46   Asthma Father    Alcohol  abuse Father    Arthritis Mother    Alcohol  abuse Sister      Prior to Admission medications   Medication Sig Start Date End Date Taking? Authorizing Provider  metoprolol  tartrate (LOPRESSOR ) 25 MG tablet Take 25 mg by mouth 2 (two) times daily. 08/26/24  Yes [provider]  acetaminophen  (TYLENOL ) 325 MG tablet Take 2 tablets (650 mg total) by mouth every 6 (six) hours as needed for mild pain (or Fever >/= 101). 03/25/22   Sherrill Cable Latif, DO  acyclovir  (ZOVIRAX ) 400 MG tablet Take 1 tablet (400 mg total) by mouth 2 (two) times daily. 05/28/24   Plotnikov, Karlynn GAILS, MD  albuterol  (VENTOLIN  HFA) 108 (90 Base) MCG/ACT inhaler Inhale 2 puffs into the lungs every 6 (six) hours as needed for wheezing or shortness of breath.    [provider]  alfuzosin  (UROXATRAL ) 10 MG 24 hr tablet TAKE 1 TABLET AT BEDTIME 04/03/24   McKenzie, Belvie CROME, MD  allopurinol  (ZYLOPRIM ) 100 MG tablet TAKE 1 TABLET EVERY DAY 01/31/24   Plotnikov, Aleksei V, MD  amoxicillin  (AMOXIL ) 500 MG capsule Take 2,000 mg by mouth daily as needed (for dental procedures).    [provider]  aspirin  81 MG chewable tablet Chew 1 tablet (81 mg total) by mouth daily. 08/22/24   Drusilla Sabas RAMAN, MD  atorvastatin  (LIPITOR ) 40 MG tablet Take 1 tablet (40 mg total) by mouth daily. 08/22/24   Drusilla Sabas RAMAN, MD  buPROPion  (WELLBUTRIN  XL) 300 MG 24 hr tablet TAKE 1 TABLET EVERY DAY WITH BREAKFAST 12/19/23   Plotnikov, Aleksei V, MD   Calcium  Citrate-Vitamin D  (CALCIUM  CITRATE + D PO) Take 1 tablet by mouth 2 (two) times daily. 1200mg  of Calcium  and 1000 units vitamin D3    [provider]  cephALEXin  (KEFLEX ) 250 MG capsule Take 250 mg by mouth daily.    [provider]  Cholecalciferol  (VITAMIN D ) 50 MCG (2000 UT) tablet Take 4,000 Units by mouth in the morning and at bedtime.    [provider]  clonazePAM  (KLONOPIN ) 1 MG tablet TAKE 1 TABLET THREE TIMES DAILY AS NEEDED FOR ANXIETY 08/26/24   Plotnikov, Aleksei V, MD  CRANBERRY PO Take 1 tablet by mouth in the morning and at bedtime.    [provider]  denosumab  (PROLIA ) 60 MG/ML SOSY injection Inject 60 mg as directed every 6 (six) months. 12/10/15   [provider]  DULoxetine  (CYMBALTA ) 60 MG capsule TAKE 1 CAPSULE TWICE DAILY 01/16/24   Plotnikov, Aleksei V, MD  EPINEPHrine  0.3 mg/0.3 mL IJ SOAJ injection Inject 0.3 mg into the muscle once as needed for anaphylaxis (severe allergic reaction). 07/31/21   [provider]  Evolocumab  (REPATHA  SURECLICK) 140 MG/ML SOAJ Inject 140 mg into the skin every 14 (fourteen) days. 07/26/24   Vannie Reche RAMAN, NP  ferrous sulfate  325 (65 FE) MG tablet Take 325 mg by mouth 2 (two) times daily with a meal.    [provider]  finasteride  (PROSCAR ) 5 MG tablet Take 5 mg by mouth daily. 09/25/23   [provider]  levothyroxine  (SYNTHROID ) 125 MCG tablet TAKE 1 TABLET EVERY DAY AT 6AM 08/08/24   Plotnikov, Aleksei V, MD  loratadine  (CLARITIN ) 10 MG tablet Take 10 mg by mouth daily.    [provider]  losartan  (COZAAR ) 25 MG tablet Take 0.5 tablets (12.5 mg total) by mouth every evening. 07/16/24 10/14/24  Vannie Reche RAMAN, NP  metoprolol  succinate (TOPROL  XL) 25 MG 24 hr tablet Take 1 tablet (25 mg total) by mouth daily. 08/08/24   Glena Harlene HERO, FNP  Multiple Vitamin (MULTIVITAMIN WITH MINERALS) TABS tablet Take 1 tablet by mouth every morning. Men's  One-A-Day 50+    [provider]  MYRBETRIQ  25 MG TB24 tablet TAKE 1  TABLET EVERY DAY 10/06/21   McKenzie, Belvie CROME, MD  nitroGLYCERIN  (NITROSTAT ) 0.4 MG SL tablet Place 0.4 mg under the tongue every 5 (five) minutes as needed for chest pain.    [provider]  omeprazole  (PRILOSEC) 40 MG capsule TAKE 1 CAPSULE EVERY DAY BEFORE BREAKFAST 01/16/24   Plotnikov, Aleksei V, MD  Potassium Citrate 15 MEQ (1620 MG) TBCR TAKE 1 TABLET IN THE MORNING AND AT BEDTIME 02/21/24   McKenzie, Belvie CROME, MD  Probiotic Product (PROBIOTIC PO) Take 1 capsule by mouth daily with breakfast.    [provider]  SKYRIZI PEN 150 MG/ML pen Inject 150 mg into the skin every 3 (three) months. 09/10/23   [provider]  solifenacin (VESICARE) 10 MG tablet Take 10 mg by mouth daily.    [provider]  testosterone  enanthate (DELATESTRYL ) 200 MG/ML injection Inject 1 mL (200 mg total) into the muscle every 14 (fourteen) days. Every other Friday 06/13/24   Plotnikov, Aleksei V, MD  vitamin C  (ASCORBIC ACID ) 500 MG tablet Take 500 mg by mouth daily with lunch.    [provider]    Physical Exam: BP (!) 149/117 (BP Location: Right Arm)   Pulse 73   Temp 97.9 F (36.6 C) (Oral)   Resp 16   SpO2 97%  General:  Alert, oriented, calm, in no acute distress, pleasant and cooperative.  Speaking in full sentences without any distress. Cardiovascular: RRR, no murmurs or rubs, no peripheral edema  Respiratory: clear to auscultation bilaterally, no wheezes, no crackles  Abdomen: soft, nontender, nondistended, normal bowel tones heard  Skin: dry, no rashes  Musculoskeletal: no joint effusions, normal range of motion  Psychiatric: appropriate affect, normal speech  Neurologic: extraocular muscles intact, clear speech, moving all extremities with intact sensorium, he however he is globally weak, has bilateral hip flexor weakness, right greater than left.  Per comparison with prior  chart, as well as discussion with the patient this is chronic.         Labs on Admission:  Basic Metabolic Panel: Recent Labs  Lab 09/12/24 0117  NA 136  K 4.6  CL 102  CO2 25  GLUCOSE 90  BUN 33*  CREATININE 1.77*  CALCIUM  9.4   Liver Function Tests: Recent Labs  Lab 09/12/24 0117  AST 34  ALT 28  ALKPHOS 56  BILITOT 0.6  PROT 5.7*  ALBUMIN  3.5   No results for input(s): LIPASE, AMYLASE in the last 168 hours. No results for input(s): AMMONIA in the last 168 hours. CBC: Recent Labs  Lab 09/12/24 0117  WBC 6.5  NEUTROABS 4.6  HGB 15.6  HCT 46.3  MCV 104.3*  PLT 134*   Cardiac Enzymes: No results for input(s): CKTOTAL, CKMB, CKMBINDEX, TROPONINI in the last 168 hours. BNP (last 3 results) Recent Labs    06/19/24 1403 08/08/24 1049  BNP 88.4 80.5    ProBNP (last 3 results) No results for input(s): PROBNP in the last 8760 hours.  CBG: No results for input(s): GLUCAP in the last 168 hours.  Radiological Exams on Admission: DG Chest Portable 1 View Result Date: 09/12/2024 CLINICAL DATA:  Leg weakness. EXAM: PORTABLE CHEST 1 VIEW COMPARISON:  August 13, 2023 FINDINGS: There is stable right-sided venous Port-A-Cath positioning. The cardiac silhouette is mildly enlarged and unchanged in size. There is marked severity calcification of the aortic arch. Both lungs are clear. Chronic left-sided rib fractures are noted. The visualized skeletal structures are otherwise unremarkable. IMPRESSION: Stable exam without  active cardiopulmonary disease. Electronically Signed   By: Suzen Dials M.D.   On: 09/12/2024 00:30   Assessment/Plan HAWLEY MICHEL is a 78 y.o. male with medical history significant for atrial fibrillation not on anticoagulation due to multiple falls, heart failure with preserved EF, CKD stage IIIa, relapsed large B-cell lymphoma and chronic right lower extremity edema resulting in multiple falls being admitted to the hospital with  recurrent fall.  Recurrent fall, generalized weakness, chronic right lower extremity weakness-appears to be mechanical in nature, as he was turning around quickly when he fell.  No prodrome, no chest pain, dizziness, lightheadedness, loss of consciousness or trauma. -Observation admission -PT/OT -Anticipate return home with home health rehab, during his hospitalization in November, inpatient rehab was denied despite appeal  Chronic UTI-he does have a history of chronic UTI and is on suppressive Keflex .  He currently does not have a UTI. -Discontinue IV antibiotics -Continue suppressive Keflex   Severe chronic L1 compression deformity, as well as multifactorial degenerative changes at L3-L4, L4-L5.  He has moderate known spinal stenosis.  Patient was seen in consultation by neurology, cardiology for preoperative restratification.  Imaging was reviewed by neurosurgery Dr. Gillie during his last hospitalization, plan for outpatient neurosurgery evaluation  Chronic atrial fibrillation-not on anticoagulation due to multiple falls and bleeding risk -Continue Toprol -XL  Hypertension-Toprol -XL, losartan   CKD stage IIIa-appears to be at baseline  Diffuse B-cell lymphoma-with evidence of relapse, followed by Dr. Onesimo  Hyperlipidemia-Lipitor   Depression-Wellbutrin   Hypothyroidism-Synthroid   DVT prophylaxis: Subcutaneous heparin     Code Status: Full Code  Consults called: None  Admission status: Observation  Time spent: 53 minutes  Joseph Scribner CHRISTELLA Gail MD [facility] Pager [phone]  If 7PM-7AM, please contact night-coverage [email or web address] Password [email or web address]  09/12/2024, 9:55 AM

## 2024-09-12 NOTE — ED Notes (Signed)
 Patient brief and linens changed due to being soiled with urine. Place new sheet on patient. Patient politely refused a warm blanket.

## 2024-09-13 DIAGNOSIS — E039 Hypothyroidism, unspecified: Secondary | ICD-10-CM | POA: Diagnosis not present

## 2024-09-13 DIAGNOSIS — I10 Essential (primary) hypertension: Secondary | ICD-10-CM

## 2024-09-13 DIAGNOSIS — N390 Urinary tract infection, site not specified: Secondary | ICD-10-CM | POA: Diagnosis not present

## 2024-09-13 DIAGNOSIS — F32A Depression, unspecified: Secondary | ICD-10-CM

## 2024-09-13 DIAGNOSIS — R531 Weakness: Secondary | ICD-10-CM | POA: Diagnosis not present

## 2024-09-13 DIAGNOSIS — E785 Hyperlipidemia, unspecified: Secondary | ICD-10-CM | POA: Diagnosis not present

## 2024-09-13 DIAGNOSIS — N1831 Chronic kidney disease, stage 3a: Secondary | ICD-10-CM

## 2024-09-13 LAB — URINE CULTURE

## 2024-09-13 LAB — CBC
HCT: 46.7 % (ref 39.0–52.0)
Hemoglobin: 15.6 g/dL (ref 13.0–17.0)
MCH: 34.6 pg — ABNORMAL HIGH (ref 26.0–34.0)
MCHC: 33.4 g/dL (ref 30.0–36.0)
MCV: 103.5 fL — ABNORMAL HIGH (ref 80.0–100.0)
Platelets: 130 K/uL — ABNORMAL LOW (ref 150–400)
RBC: 4.51 MIL/uL (ref 4.22–5.81)
RDW: 16.3 % — ABNORMAL HIGH (ref 11.5–15.5)
WBC: 5.2 K/uL (ref 4.0–10.5)
nRBC: 0 % (ref 0.0–0.2)

## 2024-09-13 LAB — BASIC METABOLIC PANEL WITH GFR
Anion gap: 9 (ref 5–15)
BUN: 27 mg/dL — ABNORMAL HIGH (ref 8–23)
CO2: 24 mmol/L (ref 22–32)
Calcium: 8.6 mg/dL — ABNORMAL LOW (ref 8.9–10.3)
Chloride: 104 mmol/L (ref 98–111)
Creatinine, Ser: 1.55 mg/dL — ABNORMAL HIGH (ref 0.61–1.24)
GFR, Estimated: 46 mL/min — ABNORMAL LOW (ref >60)
Glucose, Bld: 85 mg/dL (ref 70–99)
Potassium: 4.1 mmol/L (ref 3.5–5.1)
Sodium: 138 mmol/L (ref 135–145)

## 2024-09-13 MED ORDER — RISAQUAD PO CAPS
1.0000 | ORAL_CAPSULE | Freq: Every day | ORAL | Status: DC
  Administered 2024-09-14: 1 via ORAL
  Filled 2024-09-13: qty 1

## 2024-09-13 MED ORDER — ALFUZOSIN HCL ER 10 MG PO TB24
10.0000 mg | ORAL_TABLET | Freq: Every day | ORAL | Status: DC
  Administered 2024-09-13: 10 mg via ORAL
  Filled 2024-09-13: qty 1

## 2024-09-13 MED ORDER — PANTOPRAZOLE SODIUM 40 MG PO TBEC
40.0000 mg | DELAYED_RELEASE_TABLET | Freq: Every day | ORAL | Status: DC
  Administered 2024-09-13 – 2024-09-14 (×2): 40 mg via ORAL
  Filled 2024-09-13 (×2): qty 1

## 2024-09-13 MED ORDER — LEVOTHYROXINE SODIUM 125 MCG PO TABS
125.0000 ug | ORAL_TABLET | Freq: Every day | ORAL | Status: DC
  Administered 2024-09-14: 125 ug via ORAL
  Filled 2024-09-13: qty 1

## 2024-09-13 MED ORDER — BUPROPION HCL ER (XL) 300 MG PO TB24
300.0000 mg | ORAL_TABLET | Freq: Every day | ORAL | Status: DC
  Administered 2024-09-13 – 2024-09-14 (×2): 300 mg via ORAL
  Filled 2024-09-13 (×2): qty 1

## 2024-09-13 MED ORDER — FERROUS SULFATE 325 (65 FE) MG PO TABS
325.0000 mg | ORAL_TABLET | Freq: Two times a day (BID) | ORAL | Status: DC
  Administered 2024-09-13 – 2024-09-14 (×2): 325 mg via ORAL
  Filled 2024-09-13 (×2): qty 1

## 2024-09-13 MED ORDER — ALLOPURINOL 100 MG PO TABS
100.0000 mg | ORAL_TABLET | Freq: Every day | ORAL | Status: DC
  Administered 2024-09-14: 100 mg via ORAL
  Filled 2024-09-13: qty 1

## 2024-09-13 MED ORDER — DULOXETINE HCL 20 MG PO CPEP
60.0000 mg | ORAL_CAPSULE | Freq: Two times a day (BID) | ORAL | Status: DC
  Administered 2024-09-13 – 2024-09-14 (×2): 60 mg via ORAL
  Filled 2024-09-13 (×2): qty 3

## 2024-09-13 MED ORDER — ATORVASTATIN CALCIUM 40 MG PO TABS
40.0000 mg | ORAL_TABLET | Freq: Every day | ORAL | Status: DC
  Administered 2024-09-14: 40 mg via ORAL
  Filled 2024-09-13: qty 1

## 2024-09-13 MED ORDER — ORAL CARE MOUTH RINSE: 15.0000 mL | OROMUCOSAL | Status: DC | PRN

## 2024-09-13 MED ORDER — METOPROLOL SUCCINATE ER 25 MG PO TB24
25.0000 mg | ORAL_TABLET | Freq: Every day | ORAL | Status: DC
  Administered 2024-09-13 – 2024-09-14 (×2): 25 mg via ORAL
  Filled 2024-09-13 (×2): qty 1

## 2024-09-13 MED ORDER — FINASTERIDE 5 MG PO TABS
5.0000 mg | ORAL_TABLET | Freq: Every day | ORAL | Status: DC
  Administered 2024-09-14: 5 mg via ORAL
  Filled 2024-09-13: qty 1

## 2024-09-13 MED ORDER — ACYCLOVIR 400 MG PO TABS
400.0000 mg | ORAL_TABLET | Freq: Two times a day (BID) | ORAL | Status: DC
  Administered 2024-09-13 – 2024-09-14 (×2): 400 mg via ORAL
  Filled 2024-09-13 (×2): qty 1

## 2024-09-13 MED ORDER — MIRABEGRON ER 25 MG PO TB24
25.0000 mg | ORAL_TABLET | Freq: Every day | ORAL | Status: DC
  Administered 2024-09-13 – 2024-09-14 (×2): 25 mg via ORAL
  Filled 2024-09-13 (×2): qty 1

## 2024-09-13 MED ORDER — LORATADINE 10 MG PO TABS
10.0000 mg | ORAL_TABLET | Freq: Every day | ORAL | Status: DC
  Administered 2024-09-14: 10 mg via ORAL
  Filled 2024-09-13: qty 1

## 2024-09-13 MED ORDER — VITAMIN D3 25 MCG (1000 UNIT) PO TABS
4000.0000 [IU] | ORAL_TABLET | Freq: Every day | ORAL | Status: DC
  Administered 2024-09-13: 4000 [IU] via ORAL
  Filled 2024-09-13: qty 4

## 2024-09-13 MED ORDER — ASPIRIN 81 MG PO CHEW
81.0000 mg | CHEWABLE_TABLET | Freq: Every day | ORAL | Status: DC
  Administered 2024-09-14: 81 mg via ORAL
  Filled 2024-09-13: qty 1

## 2024-09-13 NOTE — Progress Notes (Signed)
 PROGRESS NOTE    [name]  FMW:[id number] DOB: 1946/09/16 DOA: 09/11/2024 PCP: Garald Karlynn GAILS, MD    Chief Complaint  Patient presents with   Weakness    Brief Narrative:  [name] is a 78 y.o. male with medical history significant for atrial fibrillation not on anticoagulation due to multiple falls, heart failure with preserved EF, CKD stage IIIa, relapsed large B-cell lymphoma and chronic right lower extremity edema resulting in multiple falls being admitted to the hospital with recurrent fall.    Assessment & Plan:   Principal Problem:   Generalized weakness  #1 recurrent falls/generalized weakness/chronic right lower extremity weakness -Secondary secondary to mechanical fall. - Patient noted he was turned around quickly when he fell.  No prodrome, no dizziness, no lightheadedness, no chest pain, no loss of consciousness, no trauma. - PT/OT. - Likely home with home health.  2.  Chronic UTI - Patient with history of chronic UTI on suppressive therapy with Keflex . - Urine cultures with multiple species present. - Continue home regimen Keflex .  3.  Severe chronic L1 compression deformity, multifactorial degenerative changes at L3-L4, L4-L5 - Patient with moderate known spinal stenosis. - Patient seen by neurology, cardiology for preop restratification. - Imaging reviewed by neurosurgery, Dr. Gillie during last hospitalization with plan for outpatient neurosurgery evaluation.  4.  Chronic A-fib -Continue Toprol -XL for rate control. - Not on anticoagulation due to multiple falls in the past and increased bleeding risk.  5.  Hypertension -Continue Toprol -XL, losartan .  6.  CKD stage IIIa -Stable.  7.  Diffuse B-cell lymphoma-with evidence of relapse - Outpatient follow-up with oncologist, Dr.Kale.  8.  Hyperlipidemia -Continue statin. - Patient on Repatha .  9.  Depression -Continue Wellbutrin  and Cymbalta .  10.   Hypothyroidism -Synthroid .   DVT prophylaxis: Heparin  Code Status: Full Family Communication: Updated patient and husband at bedside. Disposition: Likely home with home health when clinically improved in the next 24 hours.  Status is: Observation The patient remains OBS appropriate and will d/c before 2 midnights.   Consultants:  None  Procedures:  Chest x-ray 09/12/2024   Antimicrobials:  Anti-infectives (From admission, onward)    Start     Dose/Rate Route Frequency Ordered Stop   09/13/24 1000  cephALEXin  (KEFLEX ) capsule 250 mg        250 mg Oral Daily 09/12/24 0949     09/12/24 2200  cefTRIAXone  (ROCEPHIN ) 2 g in sodium chloride  0.9 % 100 mL IVPB  Status:  Discontinued        2 g 200 mL/hr over 30 Minutes Intravenous Every 24 hours 09/12/24 0642 09/12/24 0949   09/12/24 1400  cefTRIAXone  (ROCEPHIN ) 1 g in sodium chloride  0.9 % 100 mL IVPB  Status:  Discontinued        1 g 200 mL/hr over 30 Minutes Intravenous  Once 09/12/24 0637 09/12/24 0642   09/12/24 0330  cefTRIAXone  (ROCEPHIN ) 1 g in sodium chloride  0.9 % 100 mL IVPB        1 g 200 mL/hr over 30 Minutes Intravenous  Once 09/12/24 0315 09/12/24 0357         Subjective: Patient laying in bed.  Therapy at bedside.  Patient denies any chest pain or shortness of breath.  No abdominal pain.  Denies any syncopal episodes.  Overall feeling better than he did on admission.  Husband at bedside.  Objective: Vitals:   09/12/24 1308 09/12/24 1720 09/12/24 2101 09/13/24 0514  BP: 134/82 (!) 163/85 (!) 141/78 ROLLEN)  144/85  Pulse: 84 67 76 79  Resp: 18 18 17 18   Temp: 98.3 F (36.8 C) 97.7 F (36.5 C) 97.7 F (36.5 C) 97.8 F (36.6 C)  TempSrc:      SpO2: 96% 98% 95% 96%    Intake/Output Summary (Last 24 hours) at 09/13/2024 1123 Last data filed at 09/13/2024 1035 Gross per 24 hour  Intake 1977.5 ml  Output 2500 ml  Net -522.5 ml   There were no vitals filed for this visit.  Examination:  General exam:  NAD. Respiratory system: Clear to auscultation. Respiratory effort normal. Cardiovascular system: S1 & S2 heard, RRR. No JVD, murmurs, rubs, gallops or clicks. No pedal edema. Gastrointestinal system: Abdomen is nondistended, soft and nontender. No organomegaly or masses felt. Normal bowel sounds heard. Central nervous system: Alert and oriented. No focal neurological deficits. Extremities: Symmetric 5 x 5 power. Skin: No rashes, lesions or ulcers Psychiatry: Judgement and insight appear normal. Mood & affect appropriate.     Data Reviewed: I have personally reviewed following labs and imaging studies  CBC: Recent Labs  Lab 09/12/24 0117 09/12/24 1325 09/13/24 0509  WBC 6.5 5.5 5.2  NEUTROABS 4.6  --   --   HGB 15.6 15.5 15.6  HCT 46.3 46.1 46.7  MCV 104.3* 104.5* 103.5*  PLT 134* 128* 130*    Basic Metabolic Panel: Recent Labs  Lab 09/12/24 0117 09/12/24 1325 09/13/24 0509  NA 136  --  138  K 4.6  --  4.1  CL 102  --  104  CO2 25  --  24  GLUCOSE 90  --  85  BUN 33*  --  27*  CREATININE 1.77* 1.70* 1.55*  CALCIUM  9.4  --  8.6*    GFR: Estimated Creatinine Clearance: 54.4 mL/min (A) (by C-G formula based on SCr of 1.55 mg/dL (H)).  Liver Function Tests: Recent Labs  Lab 09/12/24 0117  AST 34  ALT 28  ALKPHOS 56  BILITOT 0.6  PROT 5.7*  ALBUMIN  3.5    CBG: No results for input(s): GLUCAP in the last 168 hours.   Recent Results (from the past 240 hours)  Resp panel by RT-PCR (RSV, Flu A&B, Covid) Anterior Nasal Swab     Status: None   Collection Time: 09/12/24  3:48 AM   Specimen: Anterior Nasal Swab  Result Value Ref Range Status   SARS Coronavirus 2 by RT PCR NEGATIVE NEGATIVE Final    Comment: (NOTE) SARS-CoV-2 target nucleic acids are NOT DETECTED.  The SARS-CoV-2 RNA is generally detectable in upper respiratory specimens during the acute phase of infection. The lowest concentration of SARS-CoV-2 viral copies this assay can detect is 138  copies/mL. A negative result does not preclude SARS-Cov-2 infection and should not be used as the sole basis for treatment or other patient management decisions. A negative result may occur with  improper specimen collection/handling, submission of specimen other than nasopharyngeal swab, presence of viral mutation(s) within the areas targeted by this assay, and inadequate number of viral copies(<138 copies/mL). A negative result must be combined with clinical observations, patient history, and epidemiological information. The expected result is Negative.  Fact Sheet for Patients:  bloggercourse.com  Fact Sheet for Healthcare Providers:  seriousbroker.it  This test is no t yet approved or cleared by the [place]  FDA and  has been authorized for detection and/or diagnosis of SARS-CoV-2 by FDA under an Emergency Use Authorization (EUA). This EUA will remain  in effect (meaning this test can  be used) for the duration of the COVID-19 declaration under Section 564(b)(1) of the Act, 21 U.S.C.section 360bbb-3(b)(1), unless the authorization is terminated  or revoked sooner.       Influenza A by PCR NEGATIVE NEGATIVE Final   Influenza B by PCR NEGATIVE NEGATIVE Final    Comment: (NOTE) The Xpert Xpress SARS-CoV-2/FLU/RSV plus assay is intended as an aid in the diagnosis of influenza from Nasopharyngeal swab specimens and should not be used as a sole basis for treatment. Nasal washings and aspirates are unacceptable for Xpert Xpress SARS-CoV-2/FLU/RSV testing.  Fact Sheet for Patients: bloggercourse.com  Fact Sheet for Healthcare Providers: seriousbroker.it  This test is not yet approved or cleared by the [place]  FDA and has been authorized for detection and/or diagnosis of SARS-CoV-2 by FDA under an Emergency Use Authorization (EUA). This EUA will remain in effect (meaning  this test can be used) for the duration of the COVID-19 declaration under Section 564(b)(1) of the Act, 21 U.S.C. section 360bbb-3(b)(1), unless the authorization is terminated or revoked.     Resp Syncytial Virus by PCR NEGATIVE NEGATIVE Final    Comment: (NOTE) Fact Sheet for Patients: bloggercourse.com  Fact Sheet for Healthcare Providers: seriousbroker.it  This test is not yet approved or cleared by the [place]  FDA and has been authorized for detection and/or diagnosis of SARS-CoV-2 by FDA under an Emergency Use Authorization (EUA). This EUA will remain in effect (meaning this test can be used) for the duration of the COVID-19 declaration under Section 564(b)(1) of the Act, 21 U.S.C. section 360bbb-3(b)(1), unless the authorization is terminated or revoked.  Performed at [facility], [address], [place], [place] [address]   Urine Culture (for pregnant, neutropenic or urologic patients or patients with an indwelling urinary catheter)     Status: Abnormal   Collection Time: 09/12/24  8:08 AM   Specimen: Urine, Clean Catch  Result Value Ref Range Status   Specimen Description   Final    URINE, CLEAN CATCH Performed at [facility], [address], [place], [place] [address]    Special Requests   Final    NONE Performed at [facility], [address] [place], [place] [address]    Culture MULTIPLE SPECIES PRESENT, SUGGEST RECOLLECTION (A)  Final   Report Status 09/13/2024 FINAL  Final         Radiology Studies: DG Chest Portable 1 View Result Date: 09/12/2024 CLINICAL DATA:  Leg weakness. EXAM: PORTABLE CHEST 1 VIEW COMPARISON:  August 13, 2023 FINDINGS: There is stable right-sided venous Port-A-Cath positioning. The cardiac silhouette is mildly enlarged and unchanged in size. There is marked severity calcification of the aortic arch. Both lungs are  clear. Chronic left-sided rib fractures are noted. The visualized skeletal structures are otherwise unremarkable. IMPRESSION: Stable exam without active cardiopulmonary disease. Electronically Signed   By: Suzen Dials M.D.   On: 09/12/2024 00:30        Scheduled Meds:  cephALEXin   250 mg Oral Daily   heparin   5,000 Units Subcutaneous Q8H   Continuous Infusions:  sodium chloride  75 mL/hr at 09/12/24 2128     LOS: 0 days    Time spent: 35 minutes    Toribio Hummer, MD [facility]   To contact the attending provider between 7A-7P or the covering provider during after hours 7P-7A, please log into the web site [email or web address] and access using universal  password for that web site. If you do not have  the password, please call the hospital operator.  09/13/2024, 11:23 AM

## 2024-09-13 NOTE — TOC Initial Note (Signed)
 Transition of Care [facility]) - Initial/Assessment Note    Patient Details  Name: [name] MRN: [id number] Date of Birth: Sep 20, 1946  [facility] CM/SW Contact:    Sonda Manuella Quill, RN Phone Number: 09/13/2024, 3:50 PM  Clinical Narrative:                 IP CM for d/c planning; orders received for HHPT/OT; spoke w/ pt and spouse [name] [phone]) in room; pt said he lives at home; he plans to return w/ support from his spouse at d/c; his spouse will provide transportation; pt verified insurance/PCP; he denied SDOH risks; he has cane, walker, wheelchair, BSC, and shower chair; pt said he has HHPT/OT w/ [facility] and he would like to con't services w/ agency; Cory at agency notified; he said agency can con't to provide services; pt notified; agency contact info placed in follow up provider section of d/c instructions; IP CM following.  Expected Discharge Plan: Home w Home Health Services Barriers to Discharge: Continued Medical Work up   Patient Goals and CMS Choice Patient states their goals for this hospitalization and ongoing recovery are:: home     [place] ownership interest in [facility]provided to:: Patient    Expected Discharge Plan and Services   Discharge Planning Services: CM Consult   Living arrangements for the past 2 months: Single Family Home                 DME Arranged: N/A DME Agency: NA       HH Arranged: PT, OT HH Agency: [facility] Date [facility] Agency Contacted: 09/13/24 Time HH Agency Contacted: 1044 Representative spoke with at [facility] Agency: Darleene  Prior Living Arrangements/Services Living arrangements for the past 2 months: Single Family Home Lives with:: Spouse Patient language and need for interpreter reviewed:: Yes Do you feel safe going back to the place where you live?: Yes        Care giver support system in place?: Yes (comment) Current home services: DME (cane, walker, wheelchair, BSC,  shower chair; HHPT/OT w/ [facility]) Criminal Activity/Legal Involvement Pertinent to Current Situation/Hospitalization: No - Comment as needed  Activities of Daily Living      Permission Sought/Granted Permission sought to share information with : Case Manager Permission granted to share information with : Yes, Verbal Permission Granted        Permission granted to share info w Relationship: [name] (spouse) [phone]     Emotional Assessment Appearance:: Appears older than stated age Attitude/Demeanor/Rapport: Gracious Affect (typically observed): Accepting Orientation: : Oriented to Self, Oriented to Place, Oriented to  Time, Oriented to Situation Alcohol  / Substance Use: Not Applicable Psych Involvement: No (comment)  Admission diagnosis:  Generalized weakness [R53.1] Urinary tract infection without hematuria, site unspecified [N39.0] Patient Active Problem List   Diagnosis Date Noted   TIA (transient ischemic attack) 08/12/2024   Right leg weakness 08/10/2024   Atopic dermatitis 05/28/2024   Bilateral sensorineural hearing loss 02/25/2023   Other specified disorders of bone density and structure, multiple sites 12/16/2022   Gross hematuria 09/16/2022   Syncope 09/05/2022   Orthostasis 07/23/2022   Malnutrition of moderate degree 07/22/2022   Recurrent UTI 06/02/2022   Recurrent falls 05/20/2022   Orthostatic hypotension 04/27/2022   Sepsis secondary to UTI (HCC) 04/22/2022   Encounter for antineoplastic chemotherapy    AKI (acute kidney injury) 03/19/2022   DNR (do not resuscitate)/DNI(Do Not Intubate) 03/19/2022   Lung mass 03/06/2022  Stage 3a chronic kidney disease (CKD) (HCC) - Baseline Scr 1.5 - 1.6 03/06/2022   Allergic rhinitis 01/25/2022   Allergic rhinitis due to animal (cat) (dog) hair and dander 01/25/2022   Allergic rhinitis due to pollen 01/25/2022   Mild intermittent asthma 01/25/2022   Vasomotor rhinitis 01/25/2022   Multiple skin nodules  01/25/2022   Weight loss 01/25/2022   Pure hypercholesterolemia 02/10/2021   Chronic systolic CHF (congestive heart failure) (HCC) 06/21/2019   H/O nephrolithotomy with removal of calculi 03/26/2019   Renal calculus 03/26/2019   History of chemotherapy 11/21/2018   Lymphoma in remission (HCC) 11/21/2018   Generalized weakness 11/08/2018   Well adult exam 10/02/2018   Counseling regarding advance care planning and goals of care 07/09/2018   Diffuse large B-cell lymphoma (HCC) 06/23/2018   Port-A-Cath in place 06/23/2018   Hypogonadism in male 11/22/2017   Nephrolithiasis 10/24/2017   Recurrent dislocation of left patella 07/15/2017   Recurrent subluxation of patella, left 07/08/2017   At high risk for falls 05/17/2017   S/P revision of total knee 04/11/2017   Failed total knee, left, initial encounter 04/09/2017   Renal calculus, right 02/08/2017   Protein-calorie malnutrition, severe 12/08/2016   Chronic obstructive pulmonary disease (HCC) 12/08/2016   Staghorn renal calculus 11/05/2016   Senile osteoporosis 06/08/2016   Dyslipidemia 01/12/2016   Insomnia secondary to depression with anxiety 12/02/2015   Generalized anxiety disorder 11/19/2015   Wound drainage right hip 11/15/2013   Expected blood loss anemia 09/18/2013   S/P right TH revision 09/17/2013   BPH with obstruction/lower urinary tract symptoms 08/03/2013   History of gastroesophageal reflux (GERD) 08/03/2013   OSA (obstructive sleep apnea) 08/03/2013   Paroxysmal atrial fibrillation (HCC) 08/03/2013   Presence of unspecified artificial hip joint 08/03/2013   Nonunion, fracture, Right tibia  07/03/2013   UTI (urinary tract infection) 07/03/2013   Chronic anticoagulation 02/21/2013   Fall 02/08/2013   Physical deconditioning 02/08/2013   Low back pain radiating to both legs 02/08/2013   Compression fracture of L1 lumbar vertebra (HCC) 02/08/2013   Morbid obesity (HCC) 02/05/2013   Anemia 10/06/2012   Essential  hypertension 10/03/2012   Psoriasis 10/03/2012   Vitamin D  deficiency 10/03/2012   GERD (gastroesophageal reflux disease) 10/03/2012   Anxiety disorder 10/03/2012   Depression 10/03/2012   CAD (coronary artery disease) 10/03/2012   DJD (degenerative joint disease) 10/03/2012   Chronic gingivitis 10/03/2012   Low testosterone , possible hypogonadism 10/01/2012   Normocytic anemia 09/30/2012   Longstanding persistent atrial fibrillation (HCC) 09/29/2012   Hypothyroidism 09/29/2012   History of recurrent UTIs 09/29/2012   PCP:  Garald Karlynn GAILS, MD Pharmacy:   [facility] [place], [place] - [address] [place] [address] Phone: [phone] Fax: [phone]  [facility] - [place], [place] - [address] [place] [address] Phone: [phone] Fax: [phone]  [facility] [address] [place] [address] Phone: [phone] Fax: [phone]     Social Drivers of Health (SDOH) Social History: SDOH Screenings   Food Insecurity: No Food Insecurity (09/13/2024)  Housing: Low Risk  (09/13/2024)  Transportation Needs: No Transportation Needs (09/13/2024)  Utilities: Not At Risk (09/13/2024)  Alcohol  Screen: Low Risk  (05/24/2024)  Depression (PHQ2-9): Low Risk  (02/02/2024)  Financial Resource Strain: Low Risk  (05/24/2024)  Physical Activity: Inactive (05/24/2024)  Social Connections: Moderately Integrated (09/13/2024)  Stress: Stress  Concern Present (05/24/2024)  Tobacco Use: Medium Risk (09/11/2024)  Health Literacy: Adequate Health Literacy (02/02/2024)   SDOH Interventions: Food Insecurity Interventions: Intervention Not Indicated, Inpatient TOC Housing Interventions: Intervention Not Indicated, Inpatient TOC Transportation Interventions: Intervention Not Indicated, Inpatient TOC Utilities Interventions: Intervention  Not Indicated, Inpatient TOC   Readmission Risk Interventions    09/17/2022   11:42 AM 04/27/2022    9:56 AM 04/26/2022    6:28 PM  Readmission Risk Prevention Plan  Transportation Screening Complete  Complete  Medication Review (RN Care Manager) Complete  Referral to Pharmacy  PCP or Specialist appointment within 3-5 days of discharge Complete Complete   HRI or Home Care Consult Complete  Complete  SW Recovery Care/Counseling Consult Complete Complete   Palliative Care Screening Not Applicable  Not Applicable  Skilled Nursing Facility Not Applicable  Not Applicable

## 2024-09-13 NOTE — Plan of Care (Signed)

## 2024-09-13 NOTE — Progress Notes (Signed)
 Physical Therapy Treatment Patient Details Name: [name] MRN: [id number] DOB: 24-Jun-1946 Today's Date: 09/13/2024   History of Present Illness [name] is a 78 y.o. male being admitted with recurrent fall.  PMH: atrial fibrillation not on anticoagulation due to multiple falls, heart failure with preserved EF, CKD stage IIIa, relapsed large B-cell lymphoma and chronic right lower extremity edema resulting in multiple falls , CHF, osteoporosis, multiple orthopedic surgeries but not limited to R THA and revisions, bil TKA, R tibia ORIF, bil wrist ORIF, L quad tendon repair    PT Comments  Pt with significant physical improvement, completes transfers with min A and RW, amb 90 ft x2 reps with RW, mostly CGA but needing min A twice to prevent fall due to knee buckling. Cues for hand placement and technique with transfers and improving bil foot clearance with gait to prevent falls and prepare for stair training as pt has 5 steps to enter home. Recliner follow for safety and pt needing 1 seated rest break with gait. Pt needing increased time and steps with turns, maintains body within RW frame and heavily reliant on BUE throughout gait cycle. Reviewed seated exercises and pt verbalizes ability to perform sets/reps while up in recliner. Spouse not present, but pt reports confidence in returning home with spouse support at this level; continue to recommend HHPT with spouse support at d/c.   If plan is discharge home, recommend the following: Assistance with cooking/housework;Assist for transportation;Help with stairs or ramp for entrance;A little help with walking and/or transfers;A little help with bathing/dressing/bathroom   Can travel by private vehicle        Equipment Recommendations  None recommended by PT    Recommendations for Other Services       Precautions / Restrictions Precautions Precautions: Fall Recall of Precautions/Restrictions: Intact Precaution/Restrictions  Comments: osteoporosis Restrictions Weight Bearing Restrictions Per Provider Order: No     Mobility  Bed Mobility Overal bed mobility: Needs Assistance Bed Mobility: Supine to Sit     Supine to sit: Contact guard, HOB elevated, Used rails     General bed mobility comments: increased time and effort, using bedrails and elevated HOB to assist    Transfers Overall transfer level: Needs assistance Equipment used: Rolling walker (2 wheels) Transfers: Sit to/from Stand Sit to Stand: Min assist, Contact guard assist           General transfer comment: min A progressing to CGA, strong BUE assisting, slow to rise, no knee buckling noted, strong BUE assisting for controlled lowering into recliner    Ambulation/Gait Ambulation/Gait assistance: Contact guard assist, Min assist, +2 safety/equipment Gait Distance (Feet): 90 Feet (x2) Assistive device: Rolling walker (2 wheels) Gait Pattern/deviations: Step-through pattern, Decreased step length - right, Decreased step length - left, Decreased stride length Gait velocity: decreased     General Gait Details: step through with short bil steps, minimal bil foot clearance needing cues for increased hip flexion in swing, heavily reliant on BUE on RW, 2 knee buckling instances requiring min A to recover and prevent fall, recliner follow for safety, seated rest break between 2 amb bouts   Stairs             Wheelchair Mobility     Tilt Bed    Modified Rankin (Stroke Patients Only)       Balance Overall balance assessment: Needs assistance, History of Falls Sitting-balance support: Feet supported Sitting balance-Leahy Scale: Good     Standing balance support: Bilateral upper extremity  supported, During functional activity, Reliant on assistive device for balance Standing balance-Leahy Scale: Poor                              Communication Communication Communication: No apparent difficulties Factors  Affecting Communication: Hearing impaired (hearing aides)  Cognition Arousal: Alert Behavior During Therapy: WFL for tasks assessed/performed   PT - Cognitive impairments: No apparent impairments                       PT - Cognition Comments: verbose, needing redirection to task Following commands: Intact      Cueing    Exercises      General Comments General comments (skin integrity, edema, etc.): HR up to 131 with ambulation, improves to 100-110 with rest      Pertinent Vitals/Pain Pain Assessment Pain Assessment: No/denies pain    Home Living                          Prior Function            PT Goals (current goals can now be found in the care plan section) Acute Rehab PT Goals Patient Stated Goal: return home, resume HHPT PT Goal Formulation: With patient Time For Goal Achievement: 09/26/24 Potential to Achieve Goals: Good Progress towards PT goals: Progressing toward goals    Frequency    Min 3X/week      PT Plan      Co-evaluation              AM-PAC PT 6 Clicks Mobility   Outcome Measure  Help needed turning from your back to your side while in a flat bed without using bedrails?: A Little Help needed moving from lying on your back to sitting on the side of a flat bed without using bedrails?: A Little Help needed moving to and from a bed to a chair (including a wheelchair)?: A Little Help needed standing up from a chair using your arms (e.g., wheelchair or bedside chair)?: A Little Help needed to walk in hospital room?: A Lot Help needed climbing 3-5 steps with a railing? : Total 6 Click Score: 15    End of Session Equipment Utilized During Treatment: Gait belt Activity Tolerance: Patient tolerated treatment well Patient left: in chair;with chair alarm set;with call bell/phone within reach Nurse Communication: Mobility status PT Visit Diagnosis: Unsteadiness on feet (R26.81);Muscle weakness (generalized)  (M62.81);History of falling (Z91.81)     Time: 9099-9075 PT Time Calculation (min) (ACUTE ONLY): 24 min  Charges:    $Gait Training: 8-22 mins $Therapeutic Activity: 8-22 mins PT General Charges $$ ACUTE PT VISIT: 1 Visit                     Tori Cheyna Retana PT, DPT 09/13/24, 9:36 AM

## 2024-09-13 NOTE — Care Management Obs Status (Signed)
 MEDICARE OBSERVATION STATUS NOTIFICATION   Patient Details  Name: [name] MRN: [id number] Date of Birth: Oct 03, 1946   Medicare Observation Status Notification Given:  Yes    Sonda Manuella Quill, RN 09/13/2024, 10:55 AM

## 2024-09-13 NOTE — Evaluation (Signed)
 Occupational Therapy Evaluation Patient Details Name: [name] MRN: [id number] DOB: 1946-09-29 Today's Date: 09/13/2024   History of Present Illness   [name] is a 78 y.o. male being admitted with recurrent fall.  PMH: atrial fibrillation not on anticoagulation due to multiple falls, heart failure with preserved EF, CKD stage IIIa, relapsed large B-cell lymphoma and chronic right lower extremity edema resulting in multiple falls , CHF, osteoporosis, multiple orthopedic surgeries but not limited to R THA and revisions, bil TKA, R tibia ORIF, bil wrist ORIF, L quad tendon repair     Clinical Impressions Pt reported at PLOF they were able to ambulate  with RW and husband will try to hover to assist as can not physically assist the pt. At this time he reported feeling close to baseline post Physical Therapy session. At this time educated pt and husband about home modifications with ADLS to decrease in falling with the return to home. He then agreed to complete standing ADLS at the sink with supervision post set up but requesting to go back in supine to rest. At this time pt would like to resume [facility] therapy with the return to home.       If plan is discharge home, recommend the following:   Assistance with cooking/housework;Assist for transportation;Help with stairs or ramp for entrance     Functional Status Assessment   Patient has had a recent decline in their functional status and demonstrates the ability to make significant improvements in function in a reasonable and predictable amount of time.     Equipment Recommendations   None recommended by OT     Recommendations for Other Services         Precautions/Restrictions   Precautions Precautions: Fall Recall of Precautions/Restrictions: Intact Precaution/Restrictions Comments: osteoporosis Restrictions Weight Bearing Restrictions Per Provider Order: No     Mobility Bed Mobility Overal bed mobility:  Modified Independent Bed Mobility: Supine to Sit, Sit to Supine     Supine to sit: HOB elevated, Used rails Sit to supine: HOB elevated, Used rails        Transfers Overall transfer level: Needs assistance Equipment used: Rolling walker (2 wheels) Transfers: Sit to/from Stand Sit to Stand: Supervision, Contact guard assist           General transfer comment: requires two attempts x2 trials      Balance Overall balance assessment: Needs assistance Sitting-balance support: Feet supported Sitting balance-Leahy Scale: Good     Standing balance support: Bilateral upper extremity supported, Single extremity supported Standing balance-Leahy Scale: Fair Standing balance comment: reliant on UE support with ambulation                           ADL either performed or assessed with clinical judgement   ADL Overall ADL's : Needs assistance/impaired Eating/Feeding: Set up   Grooming: Wash/dry hands;Wash/dry face;Oral care;Standing   Upper Body Bathing: Set up;Sitting   Lower Body Bathing: Contact guard assist;Supervison/ safety;Sit to/from stand   Upper Body Dressing : Set up;Sitting   Lower Body Dressing: Supervision/safety;Contact guard assist   Toilet Transfer: Contact guard assist;Rolling walker (2 wheels)   Toileting- Clothing Manipulation and Hygiene: Supervision/safety;Contact guard assist;Sit to/from stand       Functional mobility during ADLs: Contact guard assist;Rolling walker (2 wheels);Cueing for sequencing;Cueing for safety       Vision Baseline Vision/History: 1 Wears glasses Ability to See in Adequate Light: 0 Adequate Patient Visual Report: No  change from baseline Vision Assessment?: Wears glasses for reading     Perception         Praxis         Pertinent Vitals/Pain Pain Assessment Pain Assessment: No/denies pain Faces Pain Scale: No hurt     Extremity/Trunk Assessment Upper Extremity Assessment Upper Extremity  Assessment: Overall WFL for tasks assessed   Lower Extremity Assessment Lower Extremity Assessment: Defer to PT evaluation   Cervical / Trunk Assessment Cervical / Trunk Assessment: Normal   Communication Communication Communication: No apparent difficulties Factors Affecting Communication: Hearing impaired (hearing aides)   Cognition Arousal: Alert Behavior During Therapy: WFL for tasks assessed/performed               OT - Cognition Comments: pleasant & highly motivated to work with therapies, noted to be distracted when attempting to talk                 Following commands: Intact       Cueing  General Comments   Cueing Techniques: Verbal cues;Tactile cues;Visual cues  HR up to 131 with ambulation, improves to 100-110 with rest   Exercises     Shoulder Instructions      Home Living Family/patient expects to be discharged to:: Private residence Living Arrangements: Spouse/significant other Available Help at Discharge: Family;Available 24 hours/day Type of Home: House Home Access: Stairs to enter [employer] of Steps: 5 Entrance Stairs-Rails: Right;Left Home Layout: Two level Alternate Level Stairs-Number of Steps: 3 + chair lift Alternate Level Stairs-Rails: Right Bathroom Shower/Tub: Producer, Television/film/video: Handicapped height Bathroom Accessibility: Yes How Accessible: Accessible via walker Home Equipment: Shower seat;Grab bars - tub/shower;Grab bars - toilet;BSC/3in1;Rolling Walker (2 wheels);Rollator (4 wheels);Cane - single point;Wheelchair - manual   Additional Comments: pt reports active with HHPT  Lives With: Spouse    Prior Functioning/Environment Prior Level of Function : Independent/Modified Independent;Driving;History of Falls (last six months)             Mobility Comments: pt reports using RW for ambulation in the home and community, using RW and handrail to enter/exit front steps, uses chair lift but has 3  steps after a curve that chair lift doesn't access so pt crawls up them ADLs Comments: pt reports spouse hovers and assits as needed    OT Problem List: Decreased strength;Decreased range of motion;Decreased activity tolerance;Impaired balance (sitting and/or standing)   OT Treatment/Interventions: Self-care/ADL training;Therapeutic exercise;DME and/or AE instruction;Therapeutic activities;Patient/family education;Balance training      OT Goals(Current goals can be found in the care plan section)   Acute Rehab OT Goals Patient Stated Goal: to go home OT Goal Formulation: With patient Time For Goal Achievement: 09/07/24 Potential to Achieve Goals: Good   OT Frequency:  Min 2X/week    Co-evaluation              AM-PAC OT 6 Clicks Daily Activity     Outcome Measure Help from another person eating meals?: None Help from another person taking care of personal grooming?: A Little Help from another person toileting, which includes using toliet, bedpan, or urinal?: A Little Help from another person bathing (including washing, rinsing, drying)?: A Little Help from another person to put on and taking off regular upper body clothing?: None Help from another person to put on and taking off regular lower body clothing?: A Little 6 Click Score: 20   End of Session Equipment Utilized During Treatment: Gait belt;Rolling walker (2 wheels) Nurse Communication: Mobility  status  Activity Tolerance: Patient tolerated treatment well Patient left: in bed;with call bell/phone within reach;with family/visitor present (md in room)  OT Visit Diagnosis: Unsteadiness on feet (R26.81);Repeated falls (R29.6);History of falling (Z91.81)                Time: 8954-8884 OT Time Calculation (min): 30 min Charges:  OT General Charges $OT Visit: 1 Visit OT Evaluation $OT Eval Low Complexity: 1 Low OT Treatments $Self Care/Home Management : 8-22 mins  Warrick POUR OTR/L  Acute Rehab Services   [phone] office number   Warrick Berber 09/13/2024, 11:23 AM

## 2024-09-14 DIAGNOSIS — C8338 Diffuse large B-cell lymphoma, lymph nodes of multiple sites: Secondary | ICD-10-CM

## 2024-09-14 DIAGNOSIS — R531 Weakness: Secondary | ICD-10-CM | POA: Diagnosis not present

## 2024-09-14 DIAGNOSIS — Z8719 Personal history of other diseases of the digestive system: Secondary | ICD-10-CM | POA: Diagnosis not present

## 2024-09-14 DIAGNOSIS — F339 Major depressive disorder, recurrent, unspecified: Secondary | ICD-10-CM | POA: Diagnosis not present

## 2024-09-14 DIAGNOSIS — F411 Generalized anxiety disorder: Secondary | ICD-10-CM

## 2024-09-14 DIAGNOSIS — I4811 Longstanding persistent atrial fibrillation: Secondary | ICD-10-CM

## 2024-09-14 DIAGNOSIS — Z87440 Personal history of urinary (tract) infections: Secondary | ICD-10-CM

## 2024-09-14 DIAGNOSIS — N1831 Chronic kidney disease, stage 3a: Secondary | ICD-10-CM | POA: Diagnosis not present

## 2024-09-14 DIAGNOSIS — E785 Hyperlipidemia, unspecified: Secondary | ICD-10-CM | POA: Diagnosis not present

## 2024-09-14 LAB — CBC
HCT: 45 % (ref 39.0–52.0)
Hemoglobin: 14.9 g/dL (ref 13.0–17.0)
MCH: 34.5 pg — ABNORMAL HIGH (ref 26.0–34.0)
MCHC: 33.1 g/dL (ref 30.0–36.0)
MCV: 104.2 fL — ABNORMAL HIGH (ref 80.0–100.0)
Platelets: 129 K/uL — ABNORMAL LOW (ref 150–400)
RBC: 4.32 MIL/uL (ref 4.22–5.81)
RDW: 16.2 % — ABNORMAL HIGH (ref 11.5–15.5)
WBC: 4.9 K/uL (ref 4.0–10.5)
nRBC: 0 % (ref 0.0–0.2)

## 2024-09-14 LAB — BASIC METABOLIC PANEL WITH GFR
Anion gap: 9 (ref 5–15)
BUN: 23 mg/dL (ref 8–23)
CO2: 23 mmol/L (ref 22–32)
Calcium: 8.8 mg/dL — ABNORMAL LOW (ref 8.9–10.3)
Chloride: 103 mmol/L (ref 98–111)
Creatinine, Ser: 1.39 mg/dL — ABNORMAL HIGH (ref 0.61–1.24)
GFR, Estimated: 52 mL/min — ABNORMAL LOW (ref >60)
Glucose, Bld: 95 mg/dL (ref 70–99)
Potassium: 3.8 mmol/L (ref 3.5–5.1)
Sodium: 136 mmol/L (ref 135–145)

## 2024-09-14 NOTE — Progress Notes (Signed)
 Occupational Therapy Treatment Patient Details Name: [name] MRN: [id number] DOB: [birth year] Today's Date: 09/14/2024   History of present illness [name] is a 78 y.o. male being admitted with recurrent fall.  PMH: atrial fibrillation not on anticoagulation due to multiple falls, heart failure with preserved EF, CKD stage IIIa, relapsed large B-cell lymphoma and chronic right lower extremity edema resulting in multiple falls , CHF, osteoporosis, multiple orthopedic surgeries but not limited to R THA and revisions, bil TKA, R tibia ORIF, bil wrist ORIF, L quad tendon repair   OT comments  Pt. Seen for skilled OT treatment session.  Pt. Able to demonstrate LB dressing while in sitting with modified figure four and turning sideways to access LE.  Reviewed energy conservation strategies to aide in safety during ADL and mobility.  Pt. Able to describe examples of modifications in b.room ie: grab bars for safety.  reviewed with pt. sitting for tasks vs. Standing esp. If feeling fatigue.  Reviewed pre planning and asking for assistance when needed for gathering items prior to beginning ADLs. All questions answered.  Note d/c home later today.         If plan is discharge home, recommend the following:  Assistance with cooking/housework;Assist for transportation;Help with stairs or ramp for entrance   Equipment Recommendations  None recommended by OT    Recommendations for Other Services      Precautions / Restrictions Precautions Precautions: Fall Recall of Precautions/Restrictions: Intact Precaution/Restrictions Comments: osteoporosis       Mobility Bed Mobility                    Transfers                         Balance                                           ADL either performed or assessed with clinical judgement   ADL Overall ADL's : Needs assistance/impaired       Grooming Details (indicate cue type and reason): pt.  able to describe method for management of t.paste/t.brush while in standing with need for ue support. reviewed with pt. other options also include having t.paste pre set up on brush prior to him entering the b.room and also sitting as needed for completion of multiple grooming tasks to aide in rest breaks and safety             Lower Body Dressing: Set up;Sitting/lateral leans Lower Body Dressing Details (indicate cue type and reason): figure four metho for LLE, and able to turn in the bed sideways to access RLE   Toilet Transfer Details (indicate cue type and reason): declined need to practice or review this transfer           General ADL Comments: reviewed EC strategies during ADL completion with focus on rest breaks, pre planning/set up and also having chairs if needed for seated rest breaks mid distance ambulation    Extremity/Trunk Assessment              Vision       Perception     Praxis     Communication Communication Communication: No apparent difficulties Factors Affecting Communication: Hearing impaired   Cognition Arousal: Alert Behavior During Therapy: WFL for tasks assessed/performed Cognition: No apparent impairments  OT - Cognition Comments: pleasant & highly motivated to work with therapies, noted to be distracted when attempting to talk                 Following commands: Intact        Cueing   Cueing Techniques: Verbal cues, Tactile cues, Visual cues  Exercises      Shoulder Instructions       General Comments      Pertinent Vitals/ Pain       Pain Assessment Pain Assessment: No/denies pain  Home Living                                          Prior Functioning/Environment              Frequency  Min 2X/week        Progress Toward Goals  OT Goals(current goals can now be found in the care plan section)  Progress towards OT goals: Progressing toward goals     Plan       Co-evaluation                 AM-PAC OT 6 Clicks Daily Activity     Outcome Measure   Help from another person eating meals?: None Help from another person taking care of personal grooming?: A Little Help from another person toileting, which includes using toliet, bedpan, or urinal?: A Little Help from another person bathing (including washing, rinsing, drying)?: A Little Help from another person to put on and taking off regular upper body clothing?: None Help from another person to put on and taking off regular lower body clothing?: A Little 6 Click Score: 20    End of Session    OT Visit Diagnosis: Unsteadiness on feet (R26.81);Repeated falls (R29.6);History of falling (Z91.81)   Activity Tolerance Patient tolerated treatment well   Patient Left in bed;with family/visitor present   Nurse Communication Other (comment) (rn approaching to provide d/c paperwork at end of session)        Time: 8670-8654 OT Time Calculation (min): 16 min  Charges: OT General Charges $OT Visit: 1 Visit OT Treatments $Self Care/Home Management : 8-22 mins  Randall, COTA/L Acute Rehabilitation [phone]   CHRISTELLA Nest Lorraine-COTA/L  09/14/2024, 1:59 PM

## 2024-09-14 NOTE — TOC Transition Note (Signed)
 Transition of Care [facility]) - Discharge Note   Patient Details  Name: [name] MRN: [id number] Date of Birth: 28-May-1946  Transition of [facility] CM/SW Contact:  Sonda Manuella Quill, RN Phone Number: 09/14/2024, 12:22 PM   Clinical Narrative:    D/C orders received; Cory at [place] notified; no IP CM needs.   Final next level of care: Home w Home Health Services Barriers to Discharge: No Barriers Identified   Patient Goals and CMS Choice Patient states their goals for this hospitalization and ongoing recovery are:: home     [place] ownership interest in [facility].provided to:: Patient    Discharge Placement                       Discharge Plan and Services Additional resources added to the After Visit Summary for     Discharge Planning Services: CM Consult            DME Arranged: N/A DME Agency: NA       HH Arranged: PT, OT HH Agency: [facility] Date [facility] Agency Contacted: 09/13/24 Time HH Agency Contacted: 1044 Representative spoke with at [facility] Agency: Darleene  Social Drivers of Health (SDOH) Interventions SDOH Screenings   Food Insecurity: No Food Insecurity (09/13/2024)  Housing: Low Risk  (09/13/2024)  Transportation Needs: No Transportation Needs (09/13/2024)  Utilities: Not At Risk (09/13/2024)  Alcohol  Screen: Low Risk  (05/24/2024)  Depression (PHQ2-9): Low Risk  (02/02/2024)  Financial Resource Strain: Low Risk  (05/24/2024)  Physical Activity: Inactive (05/24/2024)  Social Connections: Moderately Integrated (09/13/2024)  Stress: Stress Concern Present (05/24/2024)  Tobacco Use: Medium Risk (09/11/2024)  Health Literacy: Adequate Health Literacy (02/02/2024)     Readmission Risk Interventions    09/17/2022   11:42 AM 04/27/2022    9:56 AM 04/26/2022    6:28 PM  Readmission Risk Prevention Plan  Transportation Screening Complete  Complete  Medication Review (RN Care Manager) Complete  Referral to Pharmacy  PCP or  Specialist appointment within 3-5 days of discharge Complete Complete   HRI or Home Care Consult Complete  Complete  SW Recovery Care/Counseling Consult Complete Complete   Palliative Care Screening Not Applicable  Not Applicable  Skilled Nursing Facility Not Applicable  Not Applicable

## 2024-09-14 NOTE — Discharge Summary (Signed)
 Physician Discharge Summary  [name] FMW:[id number] DOB: [birth year] DOA: 09/11/2024  PCP: Joseph Joseph GAILS, MD  Admit date: 09/11/2024 Discharge date: 09/14/2024  Time spent: 60 minutes  Recommendations for Outpatient Follow-up:  Follow-up with [name], Joseph GAILS, MD in 2 weeks.    Discharge Diagnoses:  Principal Problem:   Generalized weakness Active Problems:   Essential hypertension   Diffuse large B-cell lymphoma (HCC)   Stage 3a chronic kidney disease (CKD) (HCC) - Baseline Scr 1.5 - 1.6   History of recurrent UTIs   Longstanding persistent atrial fibrillation (HCC)   Anxiety disorder   Depression   Pure hypercholesterolemia   Dyslipidemia   History of gastroesophageal reflux (GERD)   Generalized anxiety disorder   Discharge Condition: Stable and improved.  Diet recommendation: Regular  There were no vitals filed for this visit.  History of present illness:  HPI per Dr. Zella Glean LELON Lane is a 78 y.o. male with medical history significant for atrial fibrillation not on anticoagulation due to multiple falls, heart failure with preserved EF, CKD stage IIIa, relapsed large B-cell lymphoma and chronic right lower extremity edema resulting in multiple falls being admitted to the hospital with recurrent fall.  History is provided by the patient, who is a knowledgeable and detailed historian.  He was actually hospitalized at the end of October and discharged home on 08/21/2024 after he was diagnosed with TIA and was denied inpatient rehab stay.  Since that time, he says he has overall been doing well, he ambulates at home with a walker and has been working with home health physical therapy and making good progress.  He denies any fevers, chills, nausea, vomiting, dysuria or other evidence of urinary tract infection, which is something that he has had chronically and is on suppressive Keflex .  The night prior to hospital admission, he had gone out to dinner  with his husband, when they came back home he stepped up on the curb getting out of the car, using his walker as he typically does.  He went to turn around, tripped over his leg and fell to the ground.  Did not hit his head and did not pass out.  Evaluation in the emergency department is quite benign, however he was diagnosed with a UTI, and hospitalist admission was requested and he was started on empiric IV Rocephin .   Hospital Course:  #1 recurrent falls/generalized weakness/chronic right lower extremity weakness -Secondary secondary to mechanical fall. - Patient noted he was turned around quickly when he fell.  No prodrome, no dizziness, no lightheadedness, no chest pain, no loss of consciousness, no trauma. - PT/OT. - Patient will discharge home with home health therapies.    2.  Chronic UTI - Patient with history of chronic UTI on suppressive therapy with Keflex . - Urine cultures with multiple species present. - Patient maintained on home dose Keflex  during the hospitalization which he will be discharged home on.     3.  Severe chronic L1 compression deformity, multifactorial degenerative changes at L3-L4, L4-L5 - Patient with moderate known spinal stenosis. - Patient seen by neurology, cardiology for preop restratification. - Imaging reviewed by neurosurgery, Dr. Gillie during last hospitalization with plan for outpatient neurosurgery evaluation.   4.  Chronic A-fib - Remains rate controlled on home regimen Toprol -XL.  - Not on anticoagulation due to multiple falls in the past and increased bleeding risk.   5.  Hypertension - Controlled on home regimen Toprol -XL and losartan .    6.  CKD  stage IIIa -Stable.   7.  Diffuse B-cell lymphoma-with evidence of relapse - Outpatient follow-up with oncologist, [name].   8.  Hyperlipidemia - Patient maintained on home regimen statin. - Patient on Repatha  in the outpatient setting.   9.  Depression - Patient maintained on home  regimen Wellbutrin  and Cymbalta .   10.  Hypothyroidism - Patient maintained on home regimen Synthroid .      Procedures: Chest x-ray 09/12/2024  Consultations: None  Discharge Exam: Vitals:   09/13/24 1900 09/14/24 0520  BP: 139/78 134/83  Pulse: 98 75  Resp:  17  Temp: 98 F (36.7 C) (!) 97.5 F (36.4 C)  SpO2: 98% 93%    General: NAD Cardiovascular: Irregularly irregular.  No JVD.  No lower extremity edema. Respiratory: Clear to auscultation bilaterally.  No wheezes, no crackles, no rhonchi.  Fair air movement.  Speaking in full sentences.  Discharge Instructions   Discharge Instructions     Diet general   Complete by: As directed    Increase activity slowly   Complete by: As directed    No wound care   Complete by: As directed       Allergies as of 09/14/2024       Reactions   Short Ragweed Pollen Ext Cough        Medication List     TAKE these medications    acetaminophen  325 MG tablet Commonly known as: TYLENOL  Take 2 tablets (650 mg total) by mouth every 6 (six) hours as needed for mild pain (or Fever >/= 101).   acyclovir  400 MG tablet Commonly known as: ZOVIRAX  Take 1 tablet (400 mg total) by mouth 2 (two) times daily.   albuterol  108 (90 Base) MCG/ACT inhaler Commonly known as: VENTOLIN  HFA Inhale 2 puffs into the lungs every 6 (six) hours as needed for wheezing or shortness of breath.   alfuzosin  10 MG 24 hr tablet Commonly known as: UROXATRAL  TAKE 1 TABLET AT BEDTIME   allopurinol  100 MG tablet Commonly known as: ZYLOPRIM  TAKE 1 TABLET EVERY DAY What changed: when to take this   amoxicillin  500 MG capsule Commonly known as: AMOXIL  Take 2,000 mg by mouth See admin instructions. Take 2,000 mg by mouth one hour prior to dental procedures   ascorbic acid  500 MG tablet Commonly known as: VITAMIN C  Take 500 mg by mouth in the morning.   aspirin  81 MG chewable tablet Chew 1 tablet (81 mg total) by mouth daily.   atorvastatin  40  MG tablet Commonly known as: LIPITOR  Take 1 tablet (40 mg total) by mouth daily. What changed: when to take this   buPROPion  300 MG 24 hr tablet Commonly known as: WELLBUTRIN  XL TAKE 1 TABLET EVERY DAY WITH BREAKFAST What changed: See the new instructions.   CALCIUM  + D3 PO Take 1 tablet by mouth See admin instructions. Calcium  @ 1,200 mg + Vitamin D -3 @ 1,000 units - Take 1 tablet by mouth two times a day   cephALEXin  250 MG capsule Commonly known as: KEFLEX  Take 250 mg by mouth at bedtime.   clonazePAM  1 MG tablet Commonly known as: KLONOPIN  TAKE 1 TABLET THREE TIMES DAILY AS NEEDED FOR ANXIETY What changed: See the new instructions.   CRANBERRY PO Take 1 tablet by mouth in the morning and at bedtime.   denosumab  60 MG/ML Sosy injection Commonly known as: PROLIA  Inject 60 mg as directed every 6 (six) months.   DULoxetine  60 MG capsule Commonly known as: CYMBALTA  TAKE 1 CAPSULE  TWICE DAILY   EPINEPHrine  0.3 mg/0.3 mL Soaj injection Commonly known as: EPI-PEN Inject 0.3 mg into the muscle once as needed for anaphylaxis (severe allergic reaction).   ferrous sulfate  325 (65 FE) MG tablet Take 325 mg by mouth 2 (two) times daily with a meal.   finasteride  5 MG tablet Commonly known as: PROSCAR  Take 5 mg by mouth daily.   levothyroxine  125 MCG tablet Commonly known as: SYNTHROID  TAKE 1 TABLET EVERY DAY AT 6AM What changed: See the new instructions.   loratadine  10 MG tablet Commonly known as: CLARITIN  Take 10 mg by mouth in the morning.   losartan  25 MG tablet Commonly known as: COZAAR  Take 0.5 tablets (12.5 mg total) by mouth every evening.   metoprolol  succinate 25 MG 24 hr tablet Commonly known as: Toprol  XL Take 1 tablet (25 mg total) by mouth daily.   Myrbetriq  25 MG Tb24 tablet Generic drug: mirabegron  ER TAKE 1 TABLET EVERY DAY What changed:  how much to take when to take this   nitroGLYCERIN  0.4 MG SL tablet Commonly known as: NITROSTAT  Place  0.4 mg under the tongue every 5 (five) minutes as needed for chest pain.   omeprazole  40 MG capsule Commonly known as: PRILOSEC TAKE 1 CAPSULE EVERY DAY BEFORE BREAKFAST What changed: See the new instructions.   One A Day Men 50 Plus Tabs Take 1 tablet by mouth daily with breakfast.   Potassium Citrate 15 MEQ (1620 MG) Tbcr TAKE 1 TABLET IN THE MORNING AND AT BEDTIME What changed: See the new instructions.   PROBIOTIC PO Take 1 capsule by mouth daily with breakfast.   Repatha  SureClick 140 MG/ML Soaj Generic drug: Evolocumab  Inject 140 mg into the skin every 14 (fourteen) days.   Skyrizi Pen 150 MG/ML pen Generic drug: risankizumab-rzaa Inject 150 mg into the skin every 3 (three) months.   solifenacin 10 MG tablet Commonly known as: VESICARE Take 10 mg by mouth daily with supper.   testosterone  enanthate 200 MG/ML injection Commonly known as: DELATESTRYL  Inject 1 mL (200 mg total) into the muscle every 14 (fourteen) days. Every other Friday   Vitamin D3 1000 units Caps Take 1,000 Units by mouth See admin instructions. Take 1,000 units by mouth with lunch and supper       Allergies  Allergen Reactions   Short Ragweed Pollen Ext Cough    Follow-up Information     [facility], [facility] Follow up.   Specialty: Home Health Services Contact information: [address] [place] [address] [phone]         [name], Joseph GAILS, MD. Schedule an appointment as soon as possible for a visit in 2 week(s).   Specialty: Internal Medicine Contact information: [address] [place] [address] [phone]                  The results of significant diagnostics from this hospitalization (including imaging, microbiology, ancillary and laboratory) are listed below for reference.    Significant Diagnostic Studies: DG Chest Portable 1 View Result Date: 09/12/2024 CLINICAL DATA:  Leg weakness. EXAM: PORTABLE CHEST 1 VIEW COMPARISON:   August 13, 2023 FINDINGS: There is stable right-sided venous Port-A-Cath positioning. The cardiac silhouette is mildly enlarged and unchanged in size. There is marked severity calcification of the aortic arch. Both lungs are clear. Chronic left-sided rib fractures are noted. The visualized skeletal structures are otherwise unremarkable. IMPRESSION: Stable exam without active cardiopulmonary disease. Electronically Signed   By: Suzen  Houston M.D.   On: 09/12/2024 00:30    Microbiology: Recent Results (from the past 240 hours)  Resp panel by RT-PCR (RSV, Flu A&B, Covid) Anterior Nasal Swab     Status: None   Collection Time: 09/12/24  3:48 AM   Specimen: Anterior Nasal Swab  Result Value Ref Range Status   SARS Coronavirus 2 by RT PCR NEGATIVE NEGATIVE Final    Comment: (NOTE) SARS-CoV-2 target nucleic acids are NOT DETECTED.  The SARS-CoV-2 RNA is generally detectable in upper respiratory specimens during the acute phase of infection. The lowest concentration of SARS-CoV-2 viral copies this assay can detect is 138 copies/mL. A negative result does not preclude SARS-Cov-2 infection and should not be used as the sole basis for treatment or other patient management decisions. A negative result may occur with  improper specimen collection/handling, submission of specimen other than nasopharyngeal swab, presence of viral mutation(s) within the areas targeted by this assay, and inadequate number of viral copies(<138 copies/mL). A negative result must be combined with clinical observations, patient history, and epidemiological information. The expected result is Negative.  Fact Sheet for Patients:  bloggercourse.com  Fact Sheet for Healthcare Providers:  seriousbroker.it  This test is no t yet approved or cleared by the [place]  FDA and  has been authorized for detection and/or diagnosis of SARS-CoV-2 by FDA under an Emergency Use  Authorization (EUA). This EUA will remain  in effect (meaning this test can be used) for the duration of the COVID-19 declaration under Section 564(b)(1) of the Act, 21 U.S.C.section 360bbb-3(b)(1), unless the authorization is terminated  or revoked sooner.       Influenza A by PCR NEGATIVE NEGATIVE Final   Influenza B by PCR NEGATIVE NEGATIVE Final    Comment: (NOTE) The Xpert Xpress SARS-CoV-2/FLU/RSV plus assay is intended as an aid in the diagnosis of influenza from Nasopharyngeal swab specimens and should not be used as a sole basis for treatment. Nasal washings and aspirates are unacceptable for Xpert Xpress SARS-CoV-2/FLU/RSV testing.  Fact Sheet for Patients: bloggercourse.com  Fact Sheet for Healthcare Providers: seriousbroker.it  This test is not yet approved or cleared by the [place]  FDA and has been authorized for detection and/or diagnosis of SARS-CoV-2 by FDA under an Emergency Use Authorization (EUA). This EUA will remain in effect (meaning this test can be used) for the duration of the COVID-19 declaration under Section 564(b)(1) of the Act, 21 U.S.C. section 360bbb-3(b)(1), unless the authorization is terminated or revoked.     Resp Syncytial Virus by PCR NEGATIVE NEGATIVE Final    Comment: (NOTE) Fact Sheet for Patients: bloggercourse.com  Fact Sheet for Healthcare Providers: seriousbroker.it  This test is not yet approved or cleared by the [place]  FDA and has been authorized for detection and/or diagnosis of SARS-CoV-2 by FDA under an Emergency Use Authorization (EUA). This EUA will remain in effect (meaning this test can be used) for the duration of the COVID-19 declaration under Section 564(b)(1) of the Act, 21 U.S.C. section 360bbb-3(b)(1), unless the authorization is terminated or revoked.  Performed at [facility],  [address] [place], [place] [address]   Urine Culture (for pregnant, neutropenic or urologic patients or patients with an indwelling urinary catheter)     Status: Abnormal   Collection Time: 09/12/24  8:08 AM   Specimen: Urine, Clean Catch  Result Value Ref Range Status   Specimen Description   Final    URINE, CLEAN CATCH Performed at [facility],  [address], [place], [place] [address]    Special Requests   Final    NONE Performed at [facility], [address] [place], [place] [address]    Culture MULTIPLE SPECIES PRESENT, SUGGEST RECOLLECTION (A)  Final   Report Status 09/13/2024 FINAL  Final     Labs: Basic Metabolic Panel: Recent Labs  Lab 09/12/24 0117 09/12/24 1325 09/13/24 0509 09/14/24 0611  NA 136  --  138 136  K 4.6  --  4.1 3.8  CL 102  --  104 103  CO2 25  --  24 23  GLUCOSE 90  --  85 95  BUN 33*  --  27* 23  CREATININE 1.77* 1.70* 1.55* 1.39*  CALCIUM  9.4  --  8.6* 8.8*   Liver Function Tests: Recent Labs  Lab 09/12/24 0117  AST 34  ALT 28  ALKPHOS 56  BILITOT 0.6  PROT 5.7*  ALBUMIN  3.5   No results for input(s): LIPASE, AMYLASE in the last 168 hours. No results for input(s): AMMONIA in the last 168 hours. CBC: Recent Labs  Lab 09/12/24 0117 09/12/24 1325 09/13/24 0509 09/14/24 0611  WBC 6.5 5.5 5.2 4.9  NEUTROABS 4.6  --   --   --   HGB 15.6 15.5 15.6 14.9  HCT 46.3 46.1 46.7 45.0  MCV 104.3* 104.5* 103.5* 104.2*  PLT 134* 128* 130* 129*   Cardiac Enzymes: No results for input(s): CKTOTAL, CKMB, CKMBINDEX, TROPONINI in the last 168 hours. BNP: BNP (last 3 results) Recent Labs    06/19/24 1403 08/08/24 1049  BNP 88.4 80.5    ProBNP (last 3 results) No results for input(s): PROBNP in the last 8760 hours.  CBG: No results for input(s): GLUCAP in the last 168 hours.     Signed:  Toribio Hummer MD.  [facility] 09/14/2024, 1:17 PM

## 2024-09-14 NOTE — Progress Notes (Signed)
 Physical Therapy Treatment Patient Details Name: [name] MRN: [id number] DOB: 1946-02-04 Today's Date: 09/14/2024   History of Present Illness [name] is a 78 y.o. male being admitted with recurrent fall.  PMH: atrial fibrillation not on anticoagulation due to multiple falls, heart failure with preserved EF, CKD stage IIIa, relapsed large B-cell lymphoma and chronic right lower extremity edema resulting in multiple falls , CHF, osteoporosis, multiple orthopedic surgeries but not limited to R THA and revisions, bil TKA, R tibia ORIF, bil wrist ORIF, L quad tendon repair    PT Comments  Pt seen for PT tx with pt agreeable, reporting he's discharging home today. Pt is able to ambulate to/from stairwell with min assist, impaired gait pattern as noted below. Pt practiced stair negotiation with R rail & RW with pt requiring min<>Mod assist for balance & 2/2 decreased strength. Strongly encouraged pt to have spouse use gait belt & assist him with stair negotiation upon return home with pt voicing understanding. Will continue to follow pt acutely to progress mobility as able.    If plan is discharge home, recommend the following: Assistance with cooking/housework;Assist for transportation;Help with stairs or ramp for entrance;A little help with walking and/or transfers;A little help with bathing/dressing/bathroom   Can travel by private vehicle        Equipment Recommendations  Other (comment) (gait belt)    Recommendations for Other Services       Precautions / Restrictions Precautions Precautions: Fall Restrictions Weight Bearing Restrictions Per Provider Order: No     Mobility  Bed Mobility Overal bed mobility: Needs Assistance Bed Mobility: Supine to Sit     Supine to sit: Modified independent (Device/Increase time), HOB elevated, Used rails (exit R side of bed)          Transfers Overall transfer level: Needs assistance Equipment used: Rolling walker (2  wheels) Transfers: Sit to/from Stand Sit to Stand: Contact guard assist           General transfer comment: 2 attempts prior to successful sit>Stand from EOB, good use of BUE to push to standing    Ambulation/Gait Ambulation/Gait assistance: Min assist, Contact guard assist Gait Distance (Feet): 50 Feet Assistive device: Rolling walker (2 wheels) Gait Pattern/deviations: Step-through pattern, Decreased step length - right, Decreased step length - left, Decreased stride length Gait velocity: decreased     General Gait Details: R knee buckling x 1-2, BLE genu recurvatum in stance phase, cuing to ambulate closer to [employer] Stairs: Yes Stairs assistance: Min assist, Mod assist Stair Management: One rail Right, Step to pattern, With walker Number of Stairs: 1 (x2 (6)) General stair comments: Pt reports he negotiates stairs leading with RLE as he has trouble with compensatory pattern, Pt also places RW at top of steps, reporting he pushes on it & pulls on rail. Pt requires min<>mod assist for stair negotiation, more balance & strength when ascending vs descending. Educated him on recommendation for spouse to assist him with stair negotiation, use of gait belt.   Wheelchair Mobility     Tilt Bed    Modified Rankin (Stroke Patients Only)       Balance Overall balance assessment: Needs assistance Sitting-balance support: Feet supported Sitting balance-Leahy Scale: Good     Standing balance support: During functional activity, Reliant on assistive device for balance, Bilateral upper extremity supported Standing balance-Leahy Scale: Fair  Communication Communication Communication: No apparent difficulties Factors Affecting Communication: Hearing impaired (hearing aides)  Cognition Arousal: Alert Behavior During Therapy: WFL for tasks assessed/performed   PT - Cognitive impairments: No apparent impairments                        PT - Cognition Comments: verbose, needing redirection to task Following commands: Intact      Cueing Cueing Techniques: Verbal cues, Tactile cues, Visual cues  Exercises      General Comments        Pertinent Vitals/Pain Pain Assessment Pain Assessment: No/denies pain    Home Living                          Prior Function            PT Goals (current goals can now be found in the care plan section) Acute Rehab PT Goals Patient Stated Goal: return home, resume HHPT PT Goal Formulation: With patient Time For Goal Achievement: 09/26/24 Potential to Achieve Goals: Good Progress towards PT goals: Progressing toward goals    Frequency    Min 3X/week      PT Plan      Co-evaluation              AM-PAC PT 6 Clicks Mobility   Outcome Measure  Help needed turning from your back to your side while in a flat bed without using bedrails?: None Help needed moving from lying on your back to sitting on the side of a flat bed without using bedrails?: A Little Help needed moving to and from a bed to a chair (including a wheelchair)?: A Little Help needed standing up from a chair using your arms (e.g., wheelchair or bedside chair)?: A Little Help needed to walk in hospital room?: A Lot Help needed climbing 3-5 steps with a railing? : A Lot 6 Click Score: 17    End of Session Equipment Utilized During Treatment: Gait belt Activity Tolerance: Patient tolerated treatment well Patient left: in bed;with family/visitor present;with call bell/phone within reach (in handoff to OT)   PT Visit Diagnosis: Unsteadiness on feet (R26.81);Muscle weakness (generalized) (M62.81);History of falling (Z91.81);Other abnormalities of gait and mobility (R26.89);Difficulty in walking, not elsewhere classified (R26.2)     Time: 8695-8671 PT Time Calculation (min) (ACUTE ONLY): 24 min  Charges:    $Gait Training: 8-22 mins $Therapeutic Activity: 8-22 mins PT  General Charges $$ ACUTE PT VISIT: 1 Visit                     [name], PT, DPT 09/14/24, 1:40 PM    [name] 09/14/2024, 1:39 PM

## 2024-09-14 NOTE — Plan of Care (Signed)

## 2024-09-17 ENCOUNTER — Telehealth: Payer: Self-pay

## 2024-09-17 NOTE — Transitions of Care (Post Inpatient/ED Visit) (Signed)
 09/17/2024  Name: [name] MRN: [id number] DOB: [birth year]  Today's TOC FU Call Status: Today's TOC FU Call Status:: Successful TOC FU Call Completed TOC FU Call Complete Date: 09/17/24  Patient's Name and Date of Birth confirmed. Name, DOB  Transition Care Management Follow-up Telephone Call Date of Discharge: 09/14/24 Discharge Facility: [facility]) Type of Discharge: Inpatient Admission Primary Inpatient Discharge Diagnosis:: UTI How have you been since you were released from the hospital?: Better Any questions or concerns?: No  Items Reviewed: Did you receive and understand the discharge instructions provided?: Yes Medications obtained,verified, and reconciled?: Yes (Medications Reviewed) Any new allergies since your discharge?: No Dietary orders reviewed?: Yes Do you have support at home?: Yes People in Home [RPT]: spouse  Medications Reviewed Today: Medications Reviewed Today     Reviewed by Emmitt Pan, LPN (Licensed Practical Nurse) on 09/17/24 at 1634  Med List Status: <None>   Medication Order Taking? Sig Documenting Provider Last Dose Status Informant  acetaminophen  (TYLENOL ) 325 MG tablet [id number] Yes Take 2 tablets (650 mg total) by mouth every 6 (six) hours as needed for mild pain (or Fever >/= 101). Sheikh, Omair [place], DO  Active Spouse/Significant Other  acyclovir  (ZOVIRAX ) 400 MG tablet [id number] Yes Take 1 tablet (400 mg total) by mouth 2 (two) times daily. Plotnikov, Aleksei V, MD  Active Spouse/Significant Other  albuterol  (VENTOLIN  HFA) 108 (90 Base) MCG/ACT inhaler [id number] Yes Inhale 2 puffs into the lungs every 6 (six) hours as needed for wheezing or shortness of breath. [provider]  Active Spouse/Significant Other  alfuzosin  (UROXATRAL ) 10 MG 24 hr tablet [id number] Yes TAKE 1 TABLET AT BEDTIME McKenzie, Belvie CROME, MD  Active Spouse/Significant Other  allopurinol  (ZYLOPRIM ) 100 MG tablet [id number] Yes TAKE 1 TABLET EVERY  DAY  Patient taking differently: Take 100 mg by mouth daily with lunch.   Plotnikov, Aleksei V, MD  Active Spouse/Significant Other  amoxicillin  (AMOXIL ) 500 MG capsule [id number] Yes Take 2,000 mg by mouth See admin instructions. Take 2,000 mg by mouth one hour prior to dental procedures [provider]  Active Spouse/Significant Other  aspirin  81 MG chewable tablet [id number] Yes Chew 1 tablet (81 mg total) by mouth daily. Drusilla Sabas RAMAN, MD  Active Spouse/Significant Other  atorvastatin  (LIPITOR ) 40 MG tablet [id number] Yes Take 1 tablet (40 mg total) by mouth daily.  Patient taking differently: Take 40 mg by mouth at bedtime.   Drusilla Sabas RAMAN, MD  Active Spouse/Significant Other  buPROPion  (WELLBUTRIN  XL) 300 MG 24 hr tablet [id number] Yes TAKE 1 TABLET EVERY DAY WITH BREAKFAST  Patient taking differently: Take 300 mg by mouth daily with breakfast.   Plotnikov, Aleksei V, MD  Active Spouse/Significant Other  Calcium  Carb-Cholecalciferol  (CALCIUM  + D3 PO) [id number] Yes Take 1 tablet by mouth See admin instructions. Calcium  @ 1,200 mg + Vitamin D -3 @ 1,000 units - Take 1 tablet by mouth two times a day [provider]  Active Spouse/Significant Other  cephALEXin  (KEFLEX ) 250 MG capsule [id number] Yes Take 250 mg by mouth at bedtime. [provider]  Active Spouse/Significant Other           Med Note MARISA, NATHANEL SAILOR   Thu Sep 13, 2024  5:45 PM) ONGOING THERAPY  Cholecalciferol  (VITAMIN D3) 1000 units CAPS [id number] Yes Take 1,000 Units by mouth See admin instructions. Take 1,000 units by mouth with lunch and supper [provider]  Active Spouse/Significant Other  clonazePAM  (KLONOPIN ) 1 MG tablet [id number] Yes TAKE  1 TABLET THREE TIMES DAILY AS NEEDED FOR ANXIETY  Patient taking differently: Take 1 mg by mouth 3 (three) times daily as needed for anxiety.   Plotnikov, Aleksei V, MD  Active Spouse/Significant Other  CRANBERRY PO [id number] Yes Take 1 tablet by mouth in  the morning and at bedtime. [provider]  Active Spouse/Significant Other           Med Note MARISA, NATHANEL LOISE Schaumann Sep 13, 2024  5:54 PM) Strength?  denosumab  (PROLIA ) 60 MG/ML SOSY injection [id number] Yes Inject 60 mg as directed every 6 (six) months. [provider]  Active Spouse/Significant Other           Med Note TORRIE, LONELL CROME   Fri Jun 11, 2022  8:36 AM)    DULoxetine  (CYMBALTA ) 60 MG capsule [id number] Yes TAKE 1 CAPSULE TWICE DAILY Plotnikov, Aleksei V, MD  Active Spouse/Significant Other  EPINEPHrine  0.3 mg/0.3 mL IJ SOAJ injection [id number] Yes Inject 0.3 mg into the muscle once as needed for anaphylaxis (severe allergic reaction). [provider]  Active Spouse/Significant Other           Med Note ANNELL JEANNINE CHRISTELLA Stevan Aug 08, 2024 10:18 AM)    Evolocumab  (REPATHA  SURECLICK) 140 MG/ML EMMANUEL [id number] Yes Inject 140 mg into the skin every 14 (fourteen) days. Vannie Reche RAMAN, NP  Active Spouse/Significant Other  ferrous sulfate  325 (65 FE) MG tablet [id number] Yes Take 325 mg by mouth 2 (two) times daily with a meal. [provider]  Active Spouse/Significant Other  finasteride  (PROSCAR ) 5 MG tablet [id number] Yes Take 5 mg by mouth daily. [provider]  Active Spouse/Significant Other  levothyroxine  (SYNTHROID ) 125 MCG tablet [id number] Yes TAKE 1 TABLET EVERY DAY AT 6AM  Patient taking differently: Take 125 mcg by mouth daily before breakfast.   Plotnikov, Aleksei V, MD  Active Spouse/Significant Other  loratadine  (CLARITIN ) 10 MG tablet [id number] Yes Take 10 mg by mouth in the morning. [provider]  Active Spouse/Significant Other  losartan  (COZAAR ) 25 MG tablet [id number] Yes Take 0.5 tablets (12.5 mg total) by mouth every evening. Vannie Reche RAMAN, NP  Active Spouse/Significant Other  metoprolol  succinate (TOPROL  XL) 25 MG 24 hr tablet [id number] Yes Take 1 tablet (25 mg total) by mouth daily. [place], Harlene CHRISTELLA, FNP   Active Spouse/Significant Other  Multiple Vitamins-Minerals (ONE A DAY MEN 50 PLUS) TABS [id number] Yes Take 1 tablet by mouth daily with breakfast. [provider]  Active Spouse/Significant Other  MYRBETRIQ  25 MG TB24 tablet [id number] Yes TAKE 1 TABLET EVERY DAY  Patient taking differently: Take 25 mg by mouth at bedtime.   McKenzie, Belvie CROME, MD  Active Spouse/Significant Other  nitroGLYCERIN  (NITROSTAT ) 0.4 MG SL tablet [id number] Yes Place 0.4 mg under the tongue every 5 (five) minutes as needed for chest pain. [provider]  Active Spouse/Significant Other  omeprazole  (PRILOSEC) 40 MG capsule [id number] Yes TAKE 1 CAPSULE EVERY DAY BEFORE BREAKFAST  Patient taking differently: Take 40 mg by mouth daily before breakfast.   Plotnikov, Aleksei V, MD  Active Spouse/Significant Other  Potassium Citrate 15 MEQ (1620 MG) TBCR [id number] Yes TAKE 1 TABLET IN THE MORNING AND AT BEDTIME  Patient taking differently: Take 1 tablet by mouth in the morning and at bedtime.   McKenzie, Belvie CROME, MD  Active Spouse/Significant Other  Probiotic Product (PROBIOTIC PO) [id number] Yes Take 1 capsule by mouth daily with breakfast. [provider]  Active Spouse/Significant  Other           Med Note DORI, FELICIA D   Sat Feb 12, 2017  1:34 AM)    SKYRIZI PEN 150 MG/ML pen [id number] Yes Inject 150 mg into the skin every 3 (three) months. [provider]  Active Spouse/Significant Other           Med Note (SATTERFIELD, TEENA BRAVO   Thu Aug 09, 2024  7:39 PM) Next dose December 25th 2025 per patient and spouse   solifenacin (VESICARE) 10 MG tablet [id number] Yes Take 10 mg by mouth daily with supper. [provider]  Active Spouse/Significant Other  testosterone  enanthate (DELATESTRYL ) 200 MG/ML injection [id number] Yes Inject 1 mL (200 mg total) into the muscle every 14 (fourteen) days. Every other Friday Plotnikov, Aleksei V, MD  Active Spouse/Significant Other  vitamin C   (ASCORBIC ACID ) 500 MG tablet [id number] Yes Take 500 mg by mouth in the morning. [provider]  Active Spouse/Significant Other           Med Note DORI, FELICIA D   Sat Feb 12, 2017  1:34 AM)              Home Care and Equipment/Supplies: Were Home Health Services Ordered?: Yes Name of Home Health Agency:: [facility] Has Agency set up a time to come to your home?: Yes First Home Health Visit Date: 09/18/24 Any new equipment or medical supplies ordered?: NA  Functional Questionnaire: Do you need assistance with bathing/showering or dressing?: No Do you need assistance with meal preparation?: No Do you need assistance with eating?: No Do you have difficulty maintaining continence: No Do you need assistance with getting out of bed/getting out of a chair/moving?: No Do you have difficulty managing or taking your medications?: No  Follow up appointments reviewed: PCP Follow-up appointment confirmed?: Yes Date of PCP follow-up appointment?: 09/18/24 Follow-up Provider: [facility] Follow-up appointment confirmed?: NA Do you need transportation to your follow-up appointment?: No Do you understand care options if your condition(s) worsen?: Yes-patient verbalized understanding    SIGNATURE Julian Lemmings, LPN [facility] Nurse Health Advisor Direct Dial [phone]

## 2024-09-18 ENCOUNTER — Ambulatory Visit: Admitting: Internal Medicine

## 2024-09-18 NOTE — Progress Notes (Unsigned)
   Subjective:   Patient ID: [name], male    DOB: 10-15-1945, 78 y.o.   MRN: [id number]  Discussed the use of AI scribe software for clinical note transcription with the patient, who gave verbal consent to proceed.  History of Present Illness   Review of Systems  Objective:  Physical Exam  Vitals:   09/18/24 1349  BP: 130/60  Pulse: 75  Temp: 97.7 F (36.5 C)  TempSrc: Oral  SpO2: 97%  Weight: 252 lb 6.4 oz (114.5 kg)  Height: 6' 4 (1.93 m)    Assessment and Plan Assessment & Plan

## 2024-09-19 ENCOUNTER — Telehealth: Payer: Self-pay | Admitting: Cardiovascular Disease

## 2024-09-19 DIAGNOSIS — Z6832 Body mass index (BMI) 32.0-32.9, adult: Secondary | ICD-10-CM | POA: Diagnosis not present

## 2024-09-19 NOTE — Telephone Encounter (Signed)
° °  Pre-operative Risk Assessment    Patient Name: [name]  DOB: [birth year] MRN: [id number]   Date of last office visit: 08/31/24 Date of next office visit: Unknown    Request for Surgical Clearance    Procedure:  dise  Date of Surgery:  Clearance TBD                                Surgeon:  Dr Vaughan Ricker  Surgeon's Group or Practice Name:  [facility]  Phone number:  [phone] Fax number:  [phone]   Type of Clearance Requested: Medical, Pharmacy      Type of Anesthesia:  General    Additional requests/questions:    [name]   09/19/2024, 3:38 PM

## 2024-09-20 ENCOUNTER — Encounter: Payer: Self-pay | Admitting: Internal Medicine

## 2024-09-20 NOTE — Telephone Encounter (Addendum)
° °  Patient Name: [name]  DOB: [birth year] MRN: [id number]  Primary Cardiologist: Annabella Scarce, MD  Chart reviewed as part of pre-operative protocol coverage. Given past medical history and time since last visit 08/31/24, based on ACC/AHA guidelines, [name] is at acceptable risk for the planned procedure without further cardiovascular testing.   He was admitted 12/2-12/5/25, admission notes reviewed. He was admitted after mechanical fall, generalized weakness, acute on chronic UTI. No cardiac concerns.   He does not take OAC nor aspirin  that would need held prior to procedure.  I will route this recommendation to the requesting party via Epic fax function and remove from pre-op pool.  Please call with questions.  Reche GORMAN Finder, NP 09/20/2024, 7:57 AM

## 2024-09-27 ENCOUNTER — Other Ambulatory Visit (HOSPITAL_COMMUNITY): Payer: Self-pay | Admitting: Family Medicine

## 2024-10-04 DIAGNOSIS — R29898 Other symptoms and signs involving the musculoskeletal system: Secondary | ICD-10-CM | POA: Diagnosis not present

## 2024-10-04 DIAGNOSIS — I081 Rheumatic disorders of both mitral and tricuspid valves: Secondary | ICD-10-CM | POA: Diagnosis not present

## 2024-10-04 DIAGNOSIS — I2510 Atherosclerotic heart disease of native coronary artery without angina pectoris: Secondary | ICD-10-CM | POA: Diagnosis not present

## 2024-10-04 DIAGNOSIS — I482 Chronic atrial fibrillation, unspecified: Secondary | ICD-10-CM | POA: Diagnosis not present

## 2024-10-04 DIAGNOSIS — I5020 Unspecified systolic (congestive) heart failure: Secondary | ICD-10-CM | POA: Diagnosis not present

## 2024-10-04 DIAGNOSIS — N1832 Chronic kidney disease, stage 3b: Secondary | ICD-10-CM | POA: Diagnosis not present

## 2024-10-04 DIAGNOSIS — I130 Hypertensive heart and chronic kidney disease with heart failure and stage 1 through stage 4 chronic kidney disease, or unspecified chronic kidney disease: Secondary | ICD-10-CM | POA: Diagnosis not present

## 2024-10-04 DIAGNOSIS — M48061 Spinal stenosis, lumbar region without neurogenic claudication: Secondary | ICD-10-CM | POA: Diagnosis not present

## 2024-10-04 DIAGNOSIS — I69398 Other sequelae of cerebral infarction: Secondary | ICD-10-CM | POA: Diagnosis not present

## 2024-10-04 DIAGNOSIS — M2578 Osteophyte, vertebrae: Secondary | ICD-10-CM | POA: Diagnosis not present

## 2024-10-04 DIAGNOSIS — M51369 Other intervertebral disc degeneration, lumbar region without mention of lumbar back pain or lower extremity pain: Secondary | ICD-10-CM | POA: Diagnosis not present

## 2024-10-04 DIAGNOSIS — M47816 Spondylosis without myelopathy or radiculopathy, lumbar region: Secondary | ICD-10-CM | POA: Diagnosis not present

## 2024-10-05 ENCOUNTER — Other Ambulatory Visit: Payer: Self-pay | Admitting: Otolaryngology

## 2024-10-05 NOTE — Telephone Encounter (Signed)
 Spoke to [name] ON 09/25/2024. DISE surgery scheduled on 10/08/2024 at [facility]. There is no post op appt for this procedure.  EMAIL & MYCHART the information for surgery.

## 2024-10-07 NOTE — Anesthesia Preprocedure Evaluation (Signed)
 "                                  Anesthesia Evaluation  Patient identified by MRN, date of birth, ID band Patient awake    Reviewed: Allergy & Precautions, NPO status , Patient's Chart, lab work & pertinent test results  Airway Mallampati: II  TM Distance: >3 FB Neck ROM: Full    Dental  (+) Missing, Dental Advisory Given, Poor Dentition,    Pulmonary asthma , sleep apnea , COPD, former smoker   Pulmonary exam normal breath sounds clear to auscultation       Cardiovascular hypertension, Pt. on home beta blockers and Pt. on medications pulmonary hypertension+ CAD and +CHF  Normal cardiovascular exam+ dysrhythmias Atrial Fibrillation  Rhythm:Regular Rate:Normal  TTE 2025 1. Left ventricular ejection fraction, by estimation, is 35 to 40%. The  left ventricle has moderately decreased function. The left ventricle  demonstrates global hypokinesis. The left ventricular internal cavity size  was mildly dilated. Diastology  indeterminate due to atrial fibrillation.   2. Right ventricular systolic function is mildly reduced. The right  ventricular size is normal. Tricuspid regurgitation signal is inadequate  for assessing PA pressure.   3. The mitral valve is normal in structure. Trivial mitral valve  regurgitation. No evidence of mitral stenosis.   4. The aortic valve was not well visualized. Aortic valve regurgitation  is not visualized. No aortic stenosis is present.   5. The inferior vena cava is normal in size with greater than 50%  respiratory variability, suggesting right atrial pressure of 3 mmHg.   Cath 2025 1. Relatively stable appearance of two-vessel coronary artery disease, including 70-80% proximal LAD stenosis and chronic total occlusion of mid RCA.  LAD disease is not hemodynamically significant by virtual FFR, though this was previously found to be significant by DFR in 2020.  Vessel overlap may affect reliability of virtual FFR. 2. Upper normal left  heart filling pressure (LVEDP 15 mmHg). 3. Moderately elevated right heart filling pressure (RA 12, RV 44/10 mmHg). 4. Mild-moderate pulmonary hypertension (PA 44/25, mean PA 33 mmHg). 5. Mildly reduced Fick CO/CI (CO 5.0 L/min, CI 2.1 L/min/m^2)      Neuro/Psych negative neurological ROS  negative psych ROS   GI/Hepatic Neg liver ROS,GERD  ,,  Endo/Other  Hypothyroidism    Renal/GU Renal InsufficiencyRenal disease  negative genitourinary   Musculoskeletal negative musculoskeletal ROS (+)    Abdominal   Peds  Hematology  (+) Blood dyscrasia (Diffuse large B cell lymphoma)   Anesthesia Other Findings   Reproductive/Obstetrics                              Anesthesia Physical Anesthesia Plan  ASA: 3  Anesthesia Plan: General   Post-op Pain Management:    Induction: Intravenous  PONV Risk Score and Plan: Propofol  infusion and Treatment may vary due to age or medical condition  Airway Management Planned: Natural Airway  Additional Equipment:   Intra-op Plan:   Post-operative Plan:   Informed Consent: I have reviewed the patients History and Physical, chart, labs and discussed the procedure including the risks, benefits and alternatives for the proposed anesthesia with the patient or authorized representative who has indicated his/her understanding and acceptance.     Dental advisory given  Plan Discussed with: CRNA  Anesthesia Plan Comments:  Anesthesia Quick Evaluation  "

## 2024-10-08 ENCOUNTER — Encounter (HOSPITAL_COMMUNITY)
Admission: RE | Disposition: A | Discharge status: Home / Self Care | Payer: Self-pay | Source: Home / Self Care | Attending: Otolaryngology

## 2024-10-08 ENCOUNTER — Ambulatory Visit (HOSPITAL_COMMUNITY)
Admission: RE | Admit: 2024-10-08 | Discharge: 2024-10-08 | Disposition: A | Discharge status: Home / Self Care | Attending: Otolaryngology | Admitting: Otolaryngology

## 2024-10-08 ENCOUNTER — Other Ambulatory Visit: Payer: Self-pay

## 2024-10-08 ENCOUNTER — Encounter (HOSPITAL_COMMUNITY): Payer: Self-pay | Admitting: Otolaryngology

## 2024-10-08 ENCOUNTER — Ambulatory Visit (HOSPITAL_COMMUNITY): Payer: Self-pay | Admitting: Anesthesiology

## 2024-10-08 DIAGNOSIS — E890 Postprocedural hypothyroidism: Secondary | ICD-10-CM | POA: Diagnosis not present

## 2024-10-08 DIAGNOSIS — I4819 Other persistent atrial fibrillation: Secondary | ICD-10-CM | POA: Insufficient documentation

## 2024-10-08 DIAGNOSIS — Z79890 Hormone replacement therapy (postmenopausal): Secondary | ICD-10-CM | POA: Insufficient documentation

## 2024-10-08 DIAGNOSIS — K219 Gastro-esophageal reflux disease without esophagitis: Secondary | ICD-10-CM | POA: Diagnosis not present

## 2024-10-08 DIAGNOSIS — N189 Chronic kidney disease, unspecified: Secondary | ICD-10-CM | POA: Insufficient documentation

## 2024-10-08 DIAGNOSIS — G4733 Obstructive sleep apnea (adult) (pediatric): Secondary | ICD-10-CM

## 2024-10-08 DIAGNOSIS — I2510 Atherosclerotic heart disease of native coronary artery without angina pectoris: Secondary | ICD-10-CM | POA: Diagnosis not present

## 2024-10-08 DIAGNOSIS — I5022 Chronic systolic (congestive) heart failure: Secondary | ICD-10-CM | POA: Diagnosis not present

## 2024-10-08 DIAGNOSIS — I5032 Chronic diastolic (congestive) heart failure: Secondary | ICD-10-CM | POA: Diagnosis not present

## 2024-10-08 DIAGNOSIS — J449 Chronic obstructive pulmonary disease, unspecified: Secondary | ICD-10-CM | POA: Diagnosis not present

## 2024-10-08 DIAGNOSIS — C8330 Diffuse large B-cell lymphoma, unspecified site: Secondary | ICD-10-CM | POA: Insufficient documentation

## 2024-10-08 DIAGNOSIS — Z9221 Personal history of antineoplastic chemotherapy: Secondary | ICD-10-CM | POA: Diagnosis not present

## 2024-10-08 DIAGNOSIS — I130 Hypertensive heart and chronic kidney disease with heart failure and stage 1 through stage 4 chronic kidney disease, or unspecified chronic kidney disease: Secondary | ICD-10-CM | POA: Diagnosis not present

## 2024-10-08 DIAGNOSIS — I110 Hypertensive heart disease with heart failure: Secondary | ICD-10-CM | POA: Diagnosis not present

## 2024-10-08 DIAGNOSIS — Z87891 Personal history of nicotine dependence: Secondary | ICD-10-CM | POA: Insufficient documentation

## 2024-10-08 DIAGNOSIS — I4891 Unspecified atrial fibrillation: Secondary | ICD-10-CM | POA: Insufficient documentation

## 2024-10-08 DIAGNOSIS — M810 Age-related osteoporosis without current pathological fracture: Secondary | ICD-10-CM | POA: Diagnosis not present

## 2024-10-08 DIAGNOSIS — Z79899 Other long term (current) drug therapy: Secondary | ICD-10-CM | POA: Diagnosis not present

## 2024-10-08 DIAGNOSIS — Z7982 Long term (current) use of aspirin: Secondary | ICD-10-CM | POA: Diagnosis not present

## 2024-10-08 DIAGNOSIS — Z79624 Long term (current) use of inhibitors of nucleotide synthesis: Secondary | ICD-10-CM | POA: Diagnosis not present

## 2024-10-08 DIAGNOSIS — I272 Pulmonary hypertension, unspecified: Secondary | ICD-10-CM | POA: Diagnosis not present

## 2024-10-08 HISTORY — PX: DRUG INDUCED ENDOSCOPY: SHX6808

## 2024-10-08 SURGERY — DRUG INDUCED SLEEP ENDOSCOPY
Anesthesia: Monitor Anesthesia Care | Laterality: Bilateral

## 2024-10-08 MED ORDER — OXYMETAZOLINE HCL 0.05 % NA SOLN
NASAL | Status: DC | PRN
  Administered 2024-10-08: 1 via TOPICAL

## 2024-10-08 MED ORDER — SODIUM CHLORIDE 0.9 % IV SOLN
INTRAVENOUS | Status: AC | PRN
  Administered 2024-10-08: 500 mL via INTRAMUSCULAR

## 2024-10-08 MED ORDER — OXYMETAZOLINE HCL 0.05 % NA SOLN
NASAL | Status: AC
  Filled 2024-10-08: qty 15

## 2024-10-08 MED ORDER — PROPOFOL 500 MG/50ML IV EMUL
INTRAVENOUS | Status: DC | PRN
  Administered 2024-10-08: 10 mg via INTRAVENOUS
  Administered 2024-10-08: 100 ug/kg/min via INTRAVENOUS

## 2024-10-08 NOTE — Transfer of Care (Signed)
 Immediate Anesthesia Transfer of Care Note  Patient: [name]  Procedure(s) Performed: DRUG INDUCED SLEEP ENDOSCOPY (Bilateral)  Patient Location: Endoscopy Unit  Anesthesia Type:MAC  Level of Consciousness: drowsy and patient cooperative  Airway & Oxygen  Therapy: Patient Spontanous Breathing and Patient connected to nasal cannula oxygen   Post-op Assessment: Report given to RN and Post -op Vital signs reviewed and stable  Post vital signs: Reviewed and stable  Last Vitals:  Vitals Value Taken Time  BP 113/63 10/08/24 08:16  Temp    Pulse 88 10/08/24 08:18  Resp 18 10/08/24 08:18  SpO2 92 % 10/08/24 08:18  Vitals shown include unfiled device data.  Last Pain:  Vitals:   10/08/24 0730  TempSrc: Temporal  PainSc: 0-No pain         Complications: No notable events documented.

## 2024-10-08 NOTE — H&P (Signed)
 [name] is an 78 y.o. male.   Chief Complaint: Sleep apnea HPI: 78 year old male with sleep apnea who has been unable to tolerate CPAP.  Past Medical History:  Diagnosis Date   Allergy 11/14/1975   Benign localized prostatic hyperplasia with lower urinary tract symptoms (LUTS)    Blood transfusion without reported diagnosis    CAD (coronary artery disease) cardiologist--  dr t. raford   hx of known CAD obstruction w/ collaterals (cath done @ [facility] 12-31-2011) ;   last cardiac cath 08-13-2019  showed sig. 2V CAD involving proxLAD and CTO of the RCA/  chronic total occlusion midRCA w/ bridging and L>R collaterals   Cancer (HCC) 06/17/2018   Chronic kidney disease    Diastolic CHF (HCC)    dx 06-15-2019 hospital admission (followed by pcp)   Diffuse large B cell lymphoma [facility]) oncologist-- dr onesimo--- in remission   dx 08/ 2019 -- bx left tonsill mass-- involving lymph nodes--- completed chemotherapy 10-12-2018   DJD (degenerative joint disease)    Dysthymic disorder    Environmental allergies    GAD (generalized anxiety disorder)    with dysthymia   GERD (gastroesophageal reflux disease)    History of falling    multiple times   History of kidney stones    HLD (hyperlipidemia)    Hyperlipidemia    Hypertension    Hypogonadism in male    OA (osteoarthritis)    OSA on CPAP    cpap machine-settings 17   Osteoporosis with fracture 04/24/2013   Persistent atrial fibrillation (HCC)    On Xarelto , cardiologist--- dr jonelle. [place]   Plaque psoriasis    dermatologist--- dr ryan gammon--- currently taking otezla    Post-surgical hypothyroidism    followed by pcp---  s/p total thyroidectomy for graves disease in 1987   Sleep apnea    Wears hearing aid in both ears     Past Surgical History:  Procedure Laterality Date   APPENDECTOMY     CARDIAC CATHETERIZATION  12-31-2011   dr jinny. nolene @[facility]    normal LVF w/ multivessel CAD,  occluded RCA w/ colleterals   CATARACT  EXTRACTION W/ INTRAOCULAR LENS  IMPLANT, BILATERAL  2016 approx.   COLONOSCOPY     CORONARY PRESSURE/FFR STUDY N/A 08/13/2019   Procedure: INTRAVASCULAR PRESSURE WIRE/FFR STUDY;  Surgeon: Mady Bruckner, MD;  [facility];  Service: Cardiovascular;  Laterality: N/A;   CORONARY PRESSURE/FFR WITH 3D MAPPING N/A 07/02/2024   Procedure: Coronary Pressure/FFR w/3D Mapping;  Surgeon: Mady Bruckner, MD;  [facility]: [facility];  Service: Cardiovascular;  Laterality: N/A;   CYSTOSCOPY WITH RETROGRADE PYELOGRAM, URETEROSCOPY AND STENT PLACEMENT Left 11/07/2017   Procedure: CYSTOSCOPY WITH RETROGRADE PYELOGRAM, URETEROSCOPY AND STENT PLACEMENT;  Surgeon: Sherrilee Belvie CROME, MD;  Location: [facility];  Service: Urology;  Laterality: Left;   CYSTOSCOPY WITH RETROGRADE PYELOGRAM, URETEROSCOPY AND STENT PLACEMENT Right 10/22/2019   Procedure: CYSTOSCOPY WITH RETROGRADE PYELOGRAM, URETEROSCOPY AND STENT PLACEMENT;  Surgeon: Sherrilee Belvie CROME, MD;  [facility];  Service: Urology;  Laterality: Right;   CYSTOSCOPY/URETEROSCOPY/HOLMIUM LASER/STENT PLACEMENT Right 03/11/2017   Procedure: CYSTOSCOPY/URETEROSCOPYSTENT PLACEMENT right ureter retrograde pylegram;  Surgeon: Sherrilee Belvie CROME, MD;  Location: [facility];  Service: Urology;  Laterality: Right;   EYE SURGERY  08/11/2017   Cataract   FRACTURE SURGERY     HARDWARE REMOVAL  10/05/2012   Procedure: HARDWARE REMOVAL;  Surgeon: Donnice JONETTA Car, MD;  Location: [facility];  Service: Orthopedics;  Laterality:  Right;  REMOVING  STRYKER  GAMMA NAIL   HARDWARE REMOVAL Right 07/03/2013   Procedure: HARDWARE REMOVAL RIGHT TIBIA ;  Surgeon: [name] VEAR Bruch, MD;  [facility];  Service: Orthopedics;  Laterality: Right;   HIP CLOSED REDUCTION Right 10/15/2013   Procedure: CLOSED MANIPULATION HIP;  Surgeon: Donnice JONETTA Car, MD;  Location: [facility];  Service: Orthopedics;  Laterality: Right;   HOLMIUM LASER APPLICATION Right  02/08/2017   Procedure: HOLMIUM LASER APPLICATION;  Surgeon: Belvie LITTIE Clara, MD;  [facility];  Service: Urology;  Laterality: Right;   HOLMIUM LASER APPLICATION Left 03/26/2019   Procedure: HOLMIUM LASER APPLICATION;  Surgeon: Clara Belvie LITTIE, MD;  [facility];  Service: Urology;  Laterality: Left;   HOLMIUM LASER APPLICATION Left 04/17/2019   Procedure: HOLMIUM LASER APPLICATION;  Surgeon: Clara Belvie LITTIE, MD;  [facility];  Service: Urology;  Laterality: Left;   HOLMIUM LASER APPLICATION Right 10/22/2019   Procedure: HOLMIUM LASER APPLICATION;  Surgeon: Clara Belvie LITTIE, MD;  Location: [facility];  Service: Urology;  Laterality: Right;   INCISION AND DRAINAGE HIP Right 11/16/2013   Procedure: IRRIGATION AND DEBRIDEMENT RIGHT HIP;  Surgeon: Donnice JONETTA Car, MD;  [facility]: [facility];  Service: Orthopedics;  Laterality: Right;   IR IMAGING GUIDED PORT INSERTION  06/22/2018   IR NEPHROSTOMY PLACEMENT LEFT  03/28/2019   IR URETERAL STENT LEFT NEW ACCESS W/O SEP NEPHROSTOMY CATH  10/24/2017   IR URETERAL STENT LEFT NEW ACCESS W/O SEP NEPHROSTOMY CATH  03/26/2019   JOINT REPLACEMENT     NEPHROLITHOTOMY Right 02/08/2017   Procedure: NEPHROLITHOTOMY PERCUTANEOUS WITH SURGEON ACCESS;  Surgeon: Belvie LITTIE Clara, MD;  [facility];  Service: Urology;  Laterality: Right;   NEPHROLITHOTOMY Left 10/24/2017   Procedure: NEPHROLITHOTOMY PERCUTANEOUS;  Surgeon: Clara Belvie LITTIE, MD;  [facility];  Service: Urology;  Laterality: Left;   NEPHROLITHOTOMY Left 03/26/2019   Procedure: NEPHROLITHOTOMY PERCUTANEOUS;  Surgeon: Clara Belvie LITTIE, MD;  Location: [facility];  Service: Urology;  Laterality: Left;  2 HRS   NEPHROLITHOTOMY Left 04/17/2019   Procedure: NEPHROLITHOTOMY PERCUTANEOUS;  Surgeon: Clara Belvie LITTIE, MD;  [facility];  Service: Urology;  Laterality: Left;  2 HRS   ORIF TIBIA FRACTURE Right 02/06/2013   Procedure: OPEN REDUCTION  INTERNAL FIXATION (ORIF) TIBIA FRACTURE WITH IM ROD FIBULA;  Surgeon: [name] VEAR Bruch, MD;  [facility];  Service: Orthopedics;  Laterality: Right;   ORIF TIBIA FRACTURE Right 07/03/2013   Procedure: RIGHT TIBIA NON UNION REPAIR ;  Surgeon: [name] VEAR Bruch, MD;  [facility];  Service: Orthopedics;  Laterality: Right;   ORIF WRIST FRACTURE  10/02/2012   Procedure: OPEN REDUCTION INTERNAL FIXATION (ORIF) WRIST FRACTURE;  Surgeon: Elsie Mussel, MD;  Location: [facility];  Service: Orthopedics;  Laterality: Right;  WITH   ANTIBIOTIC  CEMENT   ORIF WRIST FRACTURE Left 10/28/2013   Procedure: OPEN REDUCTION INTERNAL FIXATION (ORIF) WRIST FRACTURE with allograft;  Surgeon: Elsie Mussel, MD;  [facility]: [facility];  Service: Orthopedics;  Laterality: Left;  DVR Plate   QUADRICEPS TENDON REPAIR Left 07/15/2017   Procedure: REPAIR QUADRICEP TENDON;  Surgeon: Liam Lerner, MD;  Location[facility];  Service: Orthopedics;  Laterality: Left;   RIGHT/LEFT HEART CATH AND CORONARY ANGIOGRAPHY N/A 08/13/2019   Procedure: RIGHT/LEFT HEART CATH AND CORONARY ANGIOGRAPHY;  Surgeon: Mady Bruckner, MD;  [facility];  Service: Cardiovascular;  Laterality: N/A;   RIGHT/LEFT HEART CATH AND CORONARY ANGIOGRAPHY  N/A 07/02/2024   Procedure: RIGHT/LEFT HEART CATH AND CORONARY ANGIOGRAPHY;  Surgeon: Mady Bruckner, MD;  [facility];  Service: Cardiovascular;  Laterality: N/A;   THYROIDECTOMY  02/1986   TOTAL HIP ARTHROPLASTY Right 03-16-2016   @[facility]    TOTAL HIP REVISION  10/05/2012   Procedure: TOTAL HIP REVISION;  Surgeon: Donnice JONETTA Car, MD;  Location: [facility];  Service: Orthopedics;  Laterality: Right;  RIGHT TOTAL HIP REVISION   TOTAL HIP REVISION Right 09/17/2013   Procedure: REVISION RIGHT TOTAL HIP ARTHROPLASTY ;  Surgeon: Donnice JONETTA Car, MD;  Location: [facility];  Service: Orthopedics;  Laterality: Right;   TOTAL HIP REVISION Right 10/26/2013   Procedure: REVISION RIGHT TOTAL HIP  ARTHROPLASTY;  Surgeon: Donnice JONETTA Car, MD;  [facility];  Service: Orthopedics;  Laterality: Right;   TOTAL KNEE ARTHROPLASTY Bilateral right 03-15-2011;  left 06-30-2011   TOTAL KNEE REVISION Left 04/11/2017   Procedure: TOTAL KNEE REVISION PATELLA and TIBIA;  Surgeon: Liam Lerner, MD;  [facility];  Service: Orthopedics;  Laterality: Left;    Family History  Problem Relation Age of Onset   CAD Father 46   Asthma Father    Alcohol  abuse Father    Arthritis Mother    Alcohol  abuse Sister    Social History:  reports that he quit smoking about 12 years ago. His smoking use included cigarettes. He started smoking about 62 years ago. He has a 50 pack-year smoking history. He has been exposed to tobacco smoke. He has never used smokeless tobacco. He reports that he does not currently use alcohol . He reports that he does not use drugs.  Allergies: Allergies[1]  Medications Prior to Admission  Medication Sig Dispense Refill   acetaminophen  (TYLENOL ) 325 MG tablet Take 2 tablets (650 mg total) by mouth every 6 (six) hours as needed for mild pain (or Fever >/= 101). 20 tablet    acyclovir  (ZOVIRAX ) 400 MG tablet Take 1 tablet (400 mg total) by mouth 2 (two) times daily. 180 tablet 3   albuterol  (VENTOLIN  HFA) 108 (90 Base) MCG/ACT inhaler Inhale 2 puffs into the lungs every 6 (six) hours as needed for wheezing or shortness of breath.     alfuzosin  (UROXATRAL ) 10 MG 24 hr tablet TAKE 1 TABLET AT BEDTIME 90 tablet 3   allopurinol  (ZYLOPRIM ) 100 MG tablet TAKE 1 TABLET EVERY DAY (Patient taking differently: Take 100 mg by mouth daily with lunch.) 90 tablet 3   amoxicillin  (AMOXIL ) 500 MG capsule Take 2,000 mg by mouth See admin instructions. Take 2,000 mg by mouth one hour prior to dental procedures     aspirin  81 MG chewable tablet Chew 1 tablet (81 mg total) by mouth daily. 30 tablet 3   atorvastatin  (LIPITOR ) 40 MG tablet Take 1 tablet (40 mg total) by mouth daily. (Patient taking  differently: Take 40 mg by mouth at bedtime.) 30 tablet 3   buPROPion  (WELLBUTRIN  XL) 300 MG 24 hr tablet TAKE 1 TABLET EVERY DAY WITH BREAKFAST (Patient taking differently: Take 300 mg by mouth daily with breakfast.) 90 tablet 3   Calcium  Carb-Cholecalciferol  (CALCIUM  + D3 PO) Take 1 tablet by mouth See admin instructions. Calcium  @ 1,200 mg + Vitamin D -3 @ 1,000 units - Take 1 tablet by mouth two times a day     cephALEXin  (KEFLEX ) 250 MG capsule Take 250 mg by mouth at bedtime.     Cholecalciferol  (VITAMIN D3) 1000 units CAPS Take 1,000 Units by mouth See admin  instructions. Take 1,000 units by mouth with lunch and supper     clonazePAM  (KLONOPIN ) 1 MG tablet TAKE 1 TABLET THREE TIMES DAILY AS NEEDED FOR ANXIETY (Patient taking differently: Take 1 mg by mouth 3 (three) times daily as needed for anxiety.) 90 tablet 3   CRANBERRY PO Take 1 tablet by mouth in the morning and at bedtime.     denosumab  (PROLIA ) 60 MG/ML SOSY injection Inject 60 mg as directed every 6 (six) months.     DULoxetine  (CYMBALTA ) 60 MG capsule TAKE 1 CAPSULE TWICE DAILY 180 capsule 3   EPINEPHrine  0.3 mg/0.3 mL IJ SOAJ injection Inject 0.3 mg into the muscle once as needed for anaphylaxis (severe allergic reaction).     Evolocumab  (REPATHA  SURECLICK) 140 MG/ML SOAJ Inject 140 mg into the skin every 14 (fourteen) days. 6 mL 3   ferrous sulfate  325 (65 FE) MG tablet Take 325 mg by mouth 2 (two) times daily with a meal.     finasteride  (PROSCAR ) 5 MG tablet Take 5 mg by mouth daily.     levothyroxine  (SYNTHROID ) 125 MCG tablet TAKE 1 TABLET EVERY DAY AT 6AM (Patient taking differently: Take 125 mcg by mouth daily before breakfast.) 90 tablet 3   loratadine  (CLARITIN ) 10 MG tablet Take 10 mg by mouth in the morning.     losartan  (COZAAR ) 25 MG tablet Take 0.5 tablets (12.5 mg total) by mouth every evening. 45 tablet 3   metoprolol  succinate (TOPROL -XL) 25 MG 24 hr tablet TAKE 1 TABLET (25 MG TOTAL) BY MOUTH DAILY 90 tablet 3    Multiple Vitamins-Minerals (ONE A DAY MEN 50 PLUS) TABS Take 1 tablet by mouth daily with breakfast.     MYRBETRIQ  25 MG TB24 tablet TAKE 1 TABLET EVERY DAY (Patient taking differently: Take 25 mg by mouth at bedtime.) 90 tablet 3   nitroGLYCERIN  (NITROSTAT ) 0.4 MG SL tablet Place 0.4 mg under the tongue every 5 (five) minutes as needed for chest pain.     omeprazole  (PRILOSEC) 40 MG capsule TAKE 1 CAPSULE EVERY DAY BEFORE BREAKFAST (Patient taking differently: Take 40 mg by mouth daily before breakfast.) 90 capsule 3   Potassium Citrate 15 MEQ (1620 MG) TBCR TAKE 1 TABLET IN THE MORNING AND AT BEDTIME (Patient taking differently: Take 1 tablet by mouth in the morning and at bedtime.) 180 tablet 3   Probiotic Product (PROBIOTIC PO) Take 1 capsule by mouth daily with breakfast.     SKYRIZI PEN 150 MG/ML pen Inject 150 mg into the skin every 3 (three) months.     solifenacin (VESICARE) 10 MG tablet Take 10 mg by mouth daily with supper.     testosterone  enanthate (DELATESTRYL ) 200 MG/ML injection Inject 1 mL (200 mg total) into the muscle every 14 (fourteen) days. Every other Friday 5 mL 3   vitamin C  (ASCORBIC ACID ) 500 MG tablet Take 500 mg by mouth in the morning.      No results found. However, due to the size of the patient record, not all encounters were searched. Please check Results Review for a complete set of results. No results found.  Review of Systems  All other systems reviewed and are negative.   There were no vitals taken for this visit. Physical Exam Constitutional:      Appearance: Normal appearance. He is normal weight.  HENT:     Head: Normocephalic and atraumatic.     Right Ear: External ear normal.     Left Ear: External ear  normal.     Nose: Nose normal.     Mouth/Throat:     Mouth: Mucous membranes are moist.     Pharynx: Oropharynx is clear.  Eyes:     Extraocular Movements: Extraocular movements intact.     Pupils: Pupils are equal, round, and reactive to  light.  Cardiovascular:     Rate and Rhythm: Normal rate.  Pulmonary:     Effort: Pulmonary effort is normal.  Skin:    General: Skin is warm and dry.  Neurological:     General: No focal deficit present.     Mental Status: He is alert and oriented to person, place, and time.  Psychiatric:        Mood and Affect: Mood normal.        Behavior: Behavior normal.        Thought Content: Thought content normal.        Judgment: Judgment normal.      Assessment/Plan Obstructive sleep apnea and BMI 32.35  To OR for sleep endoscopy.  Vaughan Ricker, MD 10/08/2024, 7:24 AM       [1]  Allergies Allergen Reactions   Short Ragweed Pollen Ext Cough

## 2024-10-08 NOTE — Op Note (Signed)
 Preop diagnosis: Obstructive sleep apnea Postop diagnosis: same Procedure: Drug-induced sleep endoscopy Surgeon: Carlie Anesth: IV sedation Compl: None Findings: There is 100% anterior-posterior collapse at the velum making him a candidate for hypoglossal nerve stimulator placement.  As a caveat, there is scarring of the right side of the velum to the posterior wall.  There was also anterior-posterior collapse at the tongue base. Description:  After discussing risks, benefits, and alternatives, the patient was brought to the operative suite and placed on the operative table in the supine position.  Anesthesia was induced and the patient was given light sedation to simulate natural sleep. When the proper level was reached, an Afrin-soaked pledget was placed in the right nasal passage for a couple of minutes and then removed.  The fiberoptic laryngoscope was then passed to view the pharynx and larynx.  Findings are noted above and the exam was recorded.  After completion, the scope was removed and the patient was returned to anesthesia for wakeup and was moved to the recovery room in stable condition.

## 2024-10-09 ENCOUNTER — Encounter (HOSPITAL_COMMUNITY): Payer: Self-pay | Admitting: Otolaryngology

## 2024-10-09 NOTE — Anesthesia Postprocedure Evaluation (Signed)
"   Anesthesia Post Note  Patient: [name]  Procedure(s) Performed: DRUG INDUCED SLEEP ENDOSCOPY (Bilateral)     Patient location during evaluation: PACU Anesthesia Type: MAC Level of consciousness: awake and alert Pain management: pain level controlled Vital Signs Assessment: post-procedure vital signs reviewed and stable Respiratory status: spontaneous breathing, nonlabored ventilation, respiratory function stable and patient connected to nasal cannula oxygen  Cardiovascular status: stable and blood pressure returned to baseline Postop Assessment: no apparent nausea or vomiting Anesthetic complications: no   No notable events documented.  Last Vitals:  Vitals:   10/08/24 0840 10/08/24 0850  BP: 119/68 114/76  Pulse: 75 71  Resp: 18 13  Temp:    SpO2: 94% 93%    Last Pain:  Vitals:   10/08/24 0850  TempSrc:   PainSc: 0-No pain                 Tavone Caesar L Laurabelle Gorczyca      "

## 2024-10-29 ENCOUNTER — Encounter: Payer: Self-pay | Admitting: Hematology

## 2024-10-31 ENCOUNTER — Other Ambulatory Visit: Payer: Self-pay | Admitting: Internal Medicine

## 2024-10-31 ENCOUNTER — Telehealth (HOSPITAL_COMMUNITY): Payer: Self-pay

## 2024-10-31 NOTE — Telephone Encounter (Signed)
 Called to confirm/remind patient of their appointment at [facility] on 11/01/24.   Appointment:   [x] Confirmed  [] Left mess   [] No answer/No voice mail  [] VM Full/unable to leave message  [] Phone not in service  Patient reminded to bring all medications and/or complete list.  Confirmed patient has transportation. Gave directions, instructed to utilize valet parking.

## 2024-11-01 ENCOUNTER — Ambulatory Visit (HOSPITAL_BASED_OUTPATIENT_CLINIC_OR_DEPARTMENT_OTHER)
Admission: RE | Admit: 2024-11-01 | Discharge: 2024-11-01 | Disposition: A | Discharge status: Home / Self Care | Source: Ambulatory Visit | Attending: Internal Medicine | Admitting: Internal Medicine

## 2024-11-01 ENCOUNTER — Encounter (HOSPITAL_COMMUNITY): Payer: Self-pay

## 2024-11-01 ENCOUNTER — Ambulatory Visit (HOSPITAL_COMMUNITY): Payer: Self-pay | Admitting: Cardiology

## 2024-11-01 ENCOUNTER — Ambulatory Visit (HOSPITAL_COMMUNITY)
Admission: RE | Admit: 2024-11-01 | Discharge: 2024-11-01 | Disposition: A | Discharge status: Home / Self Care | Source: Ambulatory Visit | Attending: Internal Medicine | Admitting: Internal Medicine

## 2024-11-01 VITALS — BP 128/60 | HR 103 | Ht 76.0 in | Wt 251.0 lb

## 2024-11-01 DIAGNOSIS — G4733 Obstructive sleep apnea (adult) (pediatric): Secondary | ICD-10-CM | POA: Insufficient documentation

## 2024-11-01 DIAGNOSIS — Z87440 Personal history of urinary (tract) infections: Secondary | ICD-10-CM | POA: Diagnosis not present

## 2024-11-01 DIAGNOSIS — I110 Hypertensive heart disease with heart failure: Secondary | ICD-10-CM | POA: Diagnosis not present

## 2024-11-01 DIAGNOSIS — I4819 Other persistent atrial fibrillation: Secondary | ICD-10-CM | POA: Diagnosis not present

## 2024-11-01 DIAGNOSIS — I5022 Chronic systolic (congestive) heart failure: Secondary | ICD-10-CM

## 2024-11-01 DIAGNOSIS — Z87891 Personal history of nicotine dependence: Secondary | ICD-10-CM | POA: Insufficient documentation

## 2024-11-01 DIAGNOSIS — Z79899 Other long term (current) drug therapy: Secondary | ICD-10-CM | POA: Diagnosis not present

## 2024-11-01 DIAGNOSIS — R9431 Abnormal electrocardiogram [ECG] [EKG]: Secondary | ICD-10-CM | POA: Diagnosis not present

## 2024-11-01 DIAGNOSIS — R55 Syncope and collapse: Secondary | ICD-10-CM | POA: Insufficient documentation

## 2024-11-01 DIAGNOSIS — Z8673 Personal history of transient ischemic attack (TIA), and cerebral infarction without residual deficits: Secondary | ICD-10-CM | POA: Insufficient documentation

## 2024-11-01 DIAGNOSIS — I2510 Atherosclerotic heart disease of native coronary artery without angina pectoris: Secondary | ICD-10-CM | POA: Insufficient documentation

## 2024-11-01 DIAGNOSIS — Z7982 Long term (current) use of aspirin: Secondary | ICD-10-CM | POA: Diagnosis not present

## 2024-11-01 DIAGNOSIS — Z9221 Personal history of antineoplastic chemotherapy: Secondary | ICD-10-CM | POA: Insufficient documentation

## 2024-11-01 DIAGNOSIS — C8590 Non-Hodgkin lymphoma, unspecified, unspecified site: Secondary | ICD-10-CM | POA: Diagnosis not present

## 2024-11-01 DIAGNOSIS — I5040 Unspecified combined systolic (congestive) and diastolic (congestive) heart failure: Secondary | ICD-10-CM | POA: Diagnosis present

## 2024-11-01 LAB — BASIC METABOLIC PANEL WITH GFR
Anion gap: 11 (ref 5–15)
BUN: 30 mg/dL — ABNORMAL HIGH (ref 8–23)
CO2: 24 mmol/L (ref 22–32)
Calcium: 9 mg/dL (ref 8.9–10.3)
Chloride: 101 mmol/L (ref 98–111)
Creatinine, Ser: 1.62 mg/dL — ABNORMAL HIGH (ref 0.61–1.24)
GFR, Estimated: 43 mL/min — ABNORMAL LOW
Glucose, Bld: 101 mg/dL — ABNORMAL HIGH (ref 70–99)
Potassium: 4.7 mmol/L (ref 3.5–5.1)
Sodium: 136 mmol/L (ref 135–145)

## 2024-11-01 LAB — ECHOCARDIOGRAM COMPLETE
Area-P 1/2: 4.68 cm2
Calc EF: 40.4 %
S' Lateral: 4.8 cm
Single Plane A2C EF: 50 %
Single Plane A4C EF: 27.8 %

## 2024-11-01 MED ORDER — LOSARTAN POTASSIUM 25 MG PO TABS: 25.0000 mg | ORAL_TABLET | Freq: Every evening | ORAL | 5 refills | Status: AC

## 2024-11-01 NOTE — Patient Instructions (Signed)
 Medication Changes:  INCREASE LOSARTAN  TO 25MG  (1) TABLET DAILY   Lab Work:  Labs done today, your results will be available in MyChart, we will contact you for abnormal readings.  Follow-Up in: 3 MONTHS WITH DR. CHERRIE AS SCHEDULED   At the [facility], you and your health needs are our priority. We have a designated team specialized in the treatment of Heart Failure. This Care Team includes your primary Heart Failure Specialized Cardiologist (physician), Advanced Practice Providers (APPs- Physician Assistants and Nurse Practitioners), and Pharmacist who all work together to provide you with the care you need, when you need it.   You may see any of the following providers on your designated Care Team at your next follow up:  Dr. Toribio Cherrie Dr. Ezra Shuck Dr. Odis Brownie Greig Mosses, NP Caffie Shed, [place] [facility] [place], [place] Beckey Coe, NP Jordan Lee, NP Tinnie Redman, PharmD   Please be sure to bring in all your medications bottles to every appointment.   Need to Contact Us :  If you have any questions or concerns before your next appointment please send us  a message through [facility] or call our office at [phone].    TO LEAVE A MESSAGE FOR THE NURSE SELECT OPTION 2, PLEASE LEAVE A MESSAGE INCLUDING: YOUR NAME DATE OF BIRTH CALL BACK NUMBER REASON FOR CALL**this is important as we prioritize the call backs  YOU WILL RECEIVE A CALL BACK THE SAME DAY AS LONG AS YOU CALL BEFORE 4:00 PM

## 2024-11-01 NOTE — Progress Notes (Signed)
" °  Echocardiogram 2D Echocardiogram has been performed.  Devora Ellouise SAUNDERS 11/01/2024, 12:12 PM "

## 2024-11-01 NOTE — Progress Notes (Signed)
 "  ADVANCED HF CLINIC NOTE  Primary Care: Plotnikov, Karlynn GAILS, MD Primary Cardiologist: Annabella Scarce, MD HF Cardiologist: Dr. Cherrie  HPI: [name] is a 79 y.o. male with a hx of relapsed large B-cell lymphoma, CAD, recurrent UTI, atrial fibrillation,OSA and systolic HF  Initially diagnosed atrial fibrillation around 2012. Amiodarone  discontinued due to persistent atrial fibrillation.  Underwent LHC  with reported 90% obstructive of unknown vessel that developed collaterals.  He was started on Ranexa .  Echo 10/2016 EF 60 to 65%, severe bilateral atrial enlargement, IVC dilated.  He had no angina so Ranexa  discontinued.   Diagnosed with diffuse large B-cell lymphoma 05/2018.  Completed 6 cycles of R-CHOP chemotherapy.  Based on echo 06/2018 EF 50 to 55%, normal diastolic function, apical hypokinesis.  LHC 08/2019 significant two-vessel CAD sequential 60 to 70% mid LAD lesions and CTO of mid RCA with bridging left-to-right collaterals.  Consideration for two-vessel CABG versus medical management.  Deemed not candidate for surgery.     03/22/22 chemo resumed due to recurrence of diffuse large b-cell lymphoma. Treated with R-GDP   Admitted 04/2022 with urosepsis due to E. coli ESBL.   Echo 08/2022 EF 35-40%   He has been off Xarelto  since 10/18/22 due to hematuria, frequent falls as risks outweighed benefits.    At visit 12/09/23 he was undergoing Epcoritamab  bispecific for refractory DLBCL with [facility] oncology.  Updated echo 05/23/24 showed reduced LVEF 25-30% (previously 35-40%), moderate RV dysfunction. He was recommended for 3 day ZIO to assess rate control of atrial fibrillation.   Zio 8/25 chronic AF avg 77 bpm  [facility] 9/25 showed stable 2v CAD; moderately elevated filling pressures, mild to moderate PH, mildly reduced CI.  cMRI 10/25 showed LVEF 35%, RVEF 40%, nonspecific mid wall LGE; NICM  Today he returns for HF. Completed echo today, interpretation pending. He reports feeling ok.  Denies resting dyspnea. Mobility limited mostly by arthritic pain but able to do most ADLs w/o significant dyspnea, NYHA Class II. Reports full med compliance. Tolerating well w/o side effects. He denies CP. ReDs normal at 31%  Non compliant w/ CPAP, unable to tolerate. He is being considered for [facility] device.   He is planning to travel to [place] soon for vacation.   Cardiac Studies - cMRI (10/25): LVEF 35%, RVEF 40%, nonspecific mid wall LGE; NICM  - R/LHC (9/25): stable 2v CAD, LAD dx not hemodynamically significant by FFR; RA 10, PA 39/23 (31), CO/CI (Fick) 5/2.1, LVEDP 15  - 3 day Zio (8/25): 100% AF, rare PVCs  - Echo 8/25: EF 25-30%, RV moderately reduced  Past Medical History:  Diagnosis Date   Allergy 11/14/1975   Benign localized prostatic hyperplasia with lower urinary tract symptoms (LUTS)    Blood transfusion without reported diagnosis    CAD (coronary artery disease) cardiologist--  dr t. scarce   hx of known CAD obstruction w/ collaterals (cath done @ [facility] 12-31-2011) ;   last cardiac cath 08-13-2019  showed sig. 2V CAD involving proxLAD and CTO of the RCA/  chronic total occlusion midRCA w/ bridging and L>R collaterals   Cancer (HCC) 06/17/2018   Chronic kidney disease    Diastolic CHF (HCC)    dx 06-15-2019 hospital admission (followed by pcp)   Diffuse large B cell lymphoma [facility]) oncologist-- dr onesimo--- in remission   dx 08/ 2019 -- bx left tonsill mass-- involving lymph nodes--- completed chemotherapy 10-12-2018   DJD (degenerative joint disease)    Dysthymic disorder    Environmental  allergies    GAD (generalized anxiety disorder)    with dysthymia   GERD (gastroesophageal reflux disease)    History of falling    multiple times   History of kidney stones    HLD (hyperlipidemia)    Hyperlipidemia    Hypertension    Hypogonadism in male    OA (osteoarthritis)    OSA on CPAP    cpap machine-settings 17   Osteoporosis with fracture 04/24/2013    Persistent atrial fibrillation (HCC)    On Xarelto , cardiologist--- dr jonelle. Baggs   Plaque psoriasis    dermatologist--- dr ryan gammon--- currently taking otezla    Post-surgical hypothyroidism    followed by pcp---  s/p total thyroidectomy for graves disease in 1987   Sleep apnea    Wears hearing aid in both ears    Current Outpatient Medications  Medication Sig Dispense Refill   acetaminophen  (TYLENOL ) 325 MG tablet Take 2 tablets (650 mg total) by mouth every 6 (six) hours as needed for mild pain (or Fever >/= 101). (Patient taking differently: Take 500 mg by mouth every 6 (six) hours as needed for mild pain (pain score 1-3) (or Fever >/= 101).) 20 tablet    acyclovir  (ZOVIRAX ) 400 MG tablet Take 1 tablet (400 mg total) by mouth 2 (two) times daily. 180 tablet 3   albuterol  (VENTOLIN  HFA) 108 (90 Base) MCG/ACT inhaler Inhale 2 puffs into the lungs every 6 (six) hours as needed for wheezing or shortness of breath.     alfuzosin  (UROXATRAL ) 10 MG 24 hr tablet TAKE 1 TABLET AT BEDTIME 90 tablet 3   allopurinol  (ZYLOPRIM ) 100 MG tablet Take 1 tablet (100 mg total) by mouth daily with lunch. 90 tablet 3   amoxicillin  (AMOXIL ) 500 MG capsule Take 2,000 mg by mouth See admin instructions. Take 2,000 mg by mouth one hour prior to dental procedures     aspirin  81 MG chewable tablet Chew 1 tablet (81 mg total) by mouth daily. 30 tablet 3   atorvastatin  (LIPITOR ) 40 MG tablet Take 1 tablet (40 mg total) by mouth daily. (Patient taking differently: Take 40 mg by mouth at bedtime.) 30 tablet 3   buPROPion  (WELLBUTRIN  XL) 300 MG 24 hr tablet TAKE 1 TABLET EVERY DAY WITH BREAKFAST 90 tablet 3   Calcium  Carb-Cholecalciferol  (CALCIUM  + D3 PO) Take 1 tablet by mouth See admin instructions. Calcium  @ 1,200 mg + Vitamin D -3 @ 1,000 units - Take 1 tablet by mouth two times a day     cephALEXin  (KEFLEX ) 250 MG capsule Take 250 mg by mouth at bedtime.     Cholecalciferol  (VITAMIN D3) 1000 units CAPS Take  1,000 Units by mouth See admin instructions. Take 1,000 units by mouth with lunch and supper     clonazePAM  (KLONOPIN ) 1 MG tablet TAKE 1 TABLET THREE TIMES DAILY AS NEEDED FOR ANXIETY (Patient taking differently: Take 1 mg by mouth 3 (three) times daily as needed for anxiety.) 90 tablet 3   CRANBERRY PO Take 1 tablet by mouth in the morning and at bedtime.     denosumab  (PROLIA ) 60 MG/ML SOSY injection Inject 60 mg as directed every 6 (six) months.     DULoxetine  (CYMBALTA ) 60 MG capsule TAKE 1 CAPSULE TWICE DAILY 180 capsule 3   EPINEPHrine  0.3 mg/0.3 mL IJ SOAJ injection Inject 0.3 mg into the muscle once as needed for anaphylaxis (severe allergic reaction).     Evolocumab  (REPATHA  SURECLICK) 140 MG/ML SOAJ Inject 140 mg into the skin  every 14 (fourteen) days. 6 mL 3   ezetimibe  (ZETIA ) 10 MG tablet Take 10 mg by mouth daily.     ferrous sulfate  325 (65 FE) MG tablet Take 325 mg by mouth 2 (two) times daily with a meal.     finasteride  (PROSCAR ) 5 MG tablet Take 5 mg by mouth daily.     levothyroxine  (SYNTHROID ) 125 MCG tablet TAKE 1 TABLET EVERY DAY AT 6AM (Patient taking differently: Take 125 mcg by mouth daily before breakfast.) 90 tablet 3   loratadine  (CLARITIN ) 10 MG tablet Take 10 mg by mouth in the morning.     metoprolol  succinate (TOPROL -XL) 25 MG 24 hr tablet TAKE 1 TABLET (25 MG TOTAL) BY MOUTH DAILY 90 tablet 3   Multiple Vitamins-Minerals (ONE A DAY MEN 50 PLUS) TABS Take 1 tablet by mouth daily with breakfast.     MYRBETRIQ  25 MG TB24 tablet TAKE 1 TABLET EVERY DAY (Patient taking differently: Take 25 mg by mouth at bedtime.) 90 tablet 3   nitroGLYCERIN  (NITROSTAT ) 0.4 MG SL tablet Place 0.4 mg under the tongue every 5 (five) minutes as needed for chest pain.     omeprazole  (PRILOSEC) 40 MG capsule Take 1 capsule (40 mg total) by mouth daily before breakfast. 90 capsule 3   Potassium Citrate 15 MEQ (1620 MG) TBCR TAKE 1 TABLET IN THE MORNING AND AT BEDTIME (Patient taking  differently: Take 1 tablet by mouth in the morning and at bedtime.) 180 tablet 3   Probiotic Product (PROBIOTIC PO) Take 1 capsule by mouth daily with breakfast.     SKYRIZI PEN 150 MG/ML pen Inject 150 mg into the skin every 3 (three) months.     solifenacin (VESICARE) 10 MG tablet Take 10 mg by mouth daily with supper.     testosterone  enanthate (DELATESTRYL ) 200 MG/ML injection Inject 1 mL (200 mg total) into the muscle every 14 (fourteen) days. Every other Friday 5 mL 3   vitamin C  (ASCORBIC ACID ) 500 MG tablet Take 500 mg by mouth in the morning.     losartan  (COZAAR ) 25 MG tablet Take 1 tablet (25 mg total) by mouth every evening. 30 tablet 5   No current facility-administered medications for this encounter.   Allergies  Allergen Reactions   Short Ragweed Pollen Ext Cough   Social History   Socioeconomic History   Marital status: Married    Spouse name: [name]   Number of children: Not on file   Years of education: Not on file   Highest education level: Master's degree (e.g., MA, MS, MEng, MEd, MSW, MBA)  Occupational History   Occupation: RETIRED  Tobacco Use   Smoking status: Former    Current packs/day: 0.00    Average packs/day: 1 pack/day for 50.0 years (50.0 ttl pk-yrs)    Types: Cigarettes    Start date: 01/11/1962    Quit date: 01/12/2012    Years since quitting: 12.8    Passive exposure: Past   Smokeless tobacco: Never  Vaping Use   Vaping status: Never Used  Substance and Sexual Activity   Alcohol  use: Not Currently    Comment: rarely   Drug use: Never   Sexual activity: Not Currently  Other Topics Concern   Not on file  Social History Narrative   Lives In home with spouse [name]      Social Drivers of Health   Tobacco Use: Medium Risk (11/01/2024)   Patient History    Smoking Tobacco Use: Former  Smokeless Tobacco Use: Never    Passive Exposure: Past  Financial Resource Strain: Low Risk (05/24/2024)   Overall Financial Resource Strain (CARDIA)     Difficulty of Paying Living Expenses: Not hard at all  Food Insecurity: No Food Insecurity (09/13/2024)   Epic    Worried About Programme Researcher, Broadcasting/film/video in the Last Year: Never true    Ran Out of Food in the Last Year: Never true  Transportation Needs: No Transportation Needs (09/13/2024)   Epic    Lack of Transportation (Medical): No    Lack of Transportation (Non-Medical): No  Physical Activity: Inactive (05/24/2024)   Exercise Vital Sign    Days of Exercise per Week: 0 days    Minutes of Exercise per Session: Not on file  Stress: Stress Concern Present (05/24/2024)   [employer] of Occupational Health - Occupational Stress Questionnaire    Feeling of Stress: To some extent  Social Connections: Moderately Integrated (09/13/2024)   Social Connection and Isolation Panel    Frequency of Communication with Friends and Family: More than three times a week    Frequency of Social Gatherings with Friends and Family: Once a week    Attends Religious Services: More than 4 times per year    Active Member of [employer] or Organizations: No    Attends Banker Meetings: Never    Marital Status: Married  Catering Manager Violence: Not At Risk (09/13/2024)   Epic    Fear of Current or Ex-Partner: No    Emotionally Abused: No    Physically Abused: No    Sexually Abused: No  Depression (PHQ2-9): Low Risk (02/02/2024)   Depression (PHQ2-9)    PHQ-2 Score: 0  Alcohol  Screen: Low Risk (05/24/2024)   Alcohol  Screen    Last Alcohol  Screening Score (AUDIT): 3  Housing: Low Risk (09/13/2024)   Epic    Unable to Pay for Housing in the Last Year: No    Number of Times Moved in the Last Year: 0    Homeless in the Last Year: No  Utilities: Not At Risk (09/13/2024)   Epic    Threatened with loss of utilities: No  Health Literacy: Adequate Health Literacy (02/02/2024)   B1300 Health Literacy    Frequency of need for help with medical instructions: Never    Family History  Problem Relation Age of  Onset   CAD Father 42   Asthma Father    Alcohol  abuse Father    Arthritis Mother    Alcohol  abuse Sister    Wt Readings from Last 3 Encounters:  11/01/24 113.9 kg (251 lb)  10/08/24 113.4 kg (250 lb)  09/18/24 114.5 kg (252 lb 6.4 oz)   BP 128/60   Pulse (!) 103   Ht 6' 4 (1.93 m)   Wt 113.9 kg (251 lb)   SpO2 94%   BMI 30.55 kg/m   PHYSICAL EXAM: GENERAL: NAD Lungs- clear  CARDIAC:  JVP not elevated           Irregularly irregular rhythm. no murmur.  No LEE  ABDOMEN: Soft, non-tender, non-distended.  EXTREMITIES: Warm and well perfused.  NEUROLOGIC: No obvious FND  ReDs reading: 31%, normal   ASSESSMENT & PLAN: 1. Chronic Systolic HF - Echo 06/2019 LVEF 50-55% ? Echo 08/2022 LVEF 35-40% ? 05/2024 LVEF 25-30%.  - Cath 2020 with 2v CAD -> medical RX. EF normal at time - [facility] (9/25) stable 2v CAD, mildly elevated filling pressures - Suspect possible component of chemotoxicity (  R-CHOP) - cMRI (10/25): LVEF 35%, RVEF 40%, nonspecific mid wall LGE; NICM - Echo today, interpretation pending  - NYHA II-IIb, limited more by orthopedic issues, Volume ok on exam. ReDs 31% - Continue Toprol  XL 25 mg qhs - Increase Losartan  to 25 mg daily  - No SGLT2i with frequent UTIs - Off spiro d/t hyperkalemia - BMP today   2. Chronic AF - Zio 8/25 - avg rate 77 (adequate rate control) - Rate controlled - off anticoagulation d/t bleed risk    3. Recurrent B cell lymphoma  - s/p R-CHOP 2019 - s/p RGDP 2023 - recently treated with Epcoritamab    4. CAD - 2v CAD on cath 2020 - Cath 9/25: stable 2v CAD - denies chest pain  - Continue statin + Zetia  - Lipids followed by gen cards, most recent LDL at goal at 14 mg/dL    5. OSA - prior sleep study >10 years ago.  - Last used CPAP 2-3 years ago.  - Sleep study 10/25: severe OSA, AHI 30.7/hr - Unable to tolerate CPAP, considering [facility]  F/u in 4-6 months   Caffie Shed, PA-C  3:42 PM  "

## 2024-11-14 ENCOUNTER — Encounter: Payer: Self-pay | Admitting: Hematology

## 2024-11-27 ENCOUNTER — Encounter (HOSPITAL_COMMUNITY): Admission: RE | Payer: Self-pay | Source: Home / Self Care

## 2024-11-27 ENCOUNTER — Ambulatory Visit (HOSPITAL_COMMUNITY): Admit: 2024-11-27 | Admitting: Otolaryngology

## 2024-11-28 ENCOUNTER — Ambulatory Visit: Admitting: Internal Medicine

## 2025-01-21 ENCOUNTER — Ambulatory Visit (HOSPITAL_COMMUNITY): Admitting: Internal Medicine

## 2025-01-22 ENCOUNTER — Ambulatory Visit (HOSPITAL_BASED_OUTPATIENT_CLINIC_OR_DEPARTMENT_OTHER): Admitting: Family

## 2025-02-04 ENCOUNTER — Ambulatory Visit
